# Patient Record
Sex: Female | Born: 1945
Health system: Southern US, Community
[De-identification: ages and names within clinical notes are randomized; demographics above are authoritative.]

## PROBLEM LIST (undated history)

## (undated) DIAGNOSIS — I209 Angina pectoris, unspecified: Secondary | ICD-10-CM

## (undated) DIAGNOSIS — IMO0002 Reserved for concepts with insufficient information to code with codable children: Secondary | ICD-10-CM

## (undated) DIAGNOSIS — M1990 Unspecified osteoarthritis, unspecified site: Secondary | ICD-10-CM

## (undated) DIAGNOSIS — G4733 Obstructive sleep apnea (adult) (pediatric): Secondary | ICD-10-CM

## (undated) DIAGNOSIS — G2111 Neuroleptic induced parkinsonism: Secondary | ICD-10-CM

## (undated) DIAGNOSIS — G4700 Insomnia, unspecified: Secondary | ICD-10-CM

## (undated) DIAGNOSIS — D509 Iron deficiency anemia, unspecified: Secondary | ICD-10-CM

## (undated) DIAGNOSIS — I2699 Other pulmonary embolism without acute cor pulmonale: Secondary | ICD-10-CM

## (undated) DIAGNOSIS — K922 Gastrointestinal hemorrhage, unspecified: Secondary | ICD-10-CM

## (undated) DIAGNOSIS — I10 Essential (primary) hypertension: Secondary | ICD-10-CM

## (undated) DIAGNOSIS — M545 Low back pain, unspecified: Secondary | ICD-10-CM

## (undated) DIAGNOSIS — N390 Urinary tract infection, site not specified: Secondary | ICD-10-CM

## (undated) DIAGNOSIS — I4510 Unspecified right bundle-branch block: Secondary | ICD-10-CM

## (undated) DIAGNOSIS — N200 Calculus of kidney: Secondary | ICD-10-CM

## (undated) DIAGNOSIS — Z8719 Personal history of other diseases of the digestive system: Secondary | ICD-10-CM

## (undated) DIAGNOSIS — N3946 Mixed incontinence: Secondary | ICD-10-CM

## (undated) DIAGNOSIS — M797 Fibromyalgia: Secondary | ICD-10-CM

## (undated) DIAGNOSIS — Z8701 Personal history of pneumonia (recurrent): Secondary | ICD-10-CM

## (undated) DIAGNOSIS — G43909 Migraine, unspecified, not intractable, without status migrainosus: Secondary | ICD-10-CM

## (undated) DIAGNOSIS — E673 Hypervitaminosis D: Secondary | ICD-10-CM

## (undated) DIAGNOSIS — D510 Vitamin B12 deficiency anemia due to intrinsic factor deficiency: Secondary | ICD-10-CM

## (undated) DIAGNOSIS — F419 Anxiety disorder, unspecified: Secondary | ICD-10-CM

## (undated) DIAGNOSIS — I5032 Chronic diastolic (congestive) heart failure: Secondary | ICD-10-CM

## (undated) DIAGNOSIS — K5790 Diverticulosis of intestine, part unspecified, without perforation or abscess without bleeding: Secondary | ICD-10-CM

## (undated) DIAGNOSIS — Z9989 Dependence on other enabling machines and devices: Secondary | ICD-10-CM

## (undated) DIAGNOSIS — J449 Chronic obstructive pulmonary disease, unspecified: Secondary | ICD-10-CM

## (undated) DIAGNOSIS — J920 Pleural plaque with presence of asbestos: Secondary | ICD-10-CM

## (undated) DIAGNOSIS — G44229 Chronic tension-type headache, not intractable: Secondary | ICD-10-CM

## (undated) DIAGNOSIS — L501 Idiopathic urticaria: Secondary | ICD-10-CM

## (undated) DIAGNOSIS — F339 Major depressive disorder, recurrent, unspecified: Secondary | ICD-10-CM

## (undated) DIAGNOSIS — J45909 Unspecified asthma, uncomplicated: Secondary | ICD-10-CM

## (undated) DIAGNOSIS — R55 Syncope and collapse: Secondary | ICD-10-CM

## (undated) DIAGNOSIS — I2510 Atherosclerotic heart disease of native coronary artery without angina pectoris: Secondary | ICD-10-CM

## (undated) DIAGNOSIS — H8110 Benign paroxysmal vertigo, unspecified ear: Secondary | ICD-10-CM

## (undated) DIAGNOSIS — R1312 Dysphagia, oropharyngeal phase: Secondary | ICD-10-CM

## (undated) DIAGNOSIS — J301 Allergic rhinitis due to pollen: Secondary | ICD-10-CM

## (undated) DIAGNOSIS — G8929 Other chronic pain: Secondary | ICD-10-CM

## (undated) DIAGNOSIS — N12 Tubulo-interstitial nephritis, not specified as acute or chronic: Secondary | ICD-10-CM

## (undated) DIAGNOSIS — M4850XA Collapsed vertebra, not elsewhere classified, site unspecified, initial encounter for fracture: Secondary | ICD-10-CM

## (undated) HISTORY — PX: DILATION AND CURETTAGE OF UTERUS: SHX78

## (undated) HISTORY — DX: Calculus of kidney: N20.0

## (undated) HISTORY — DX: Pleural plaque with presence of asbestos: J92.0

## (undated) HISTORY — DX: Insomnia, unspecified: G47.00

## (undated) HISTORY — PX: TRANSTHORACIC ECHOCARDIOGRAM: SHX275

## (undated) HISTORY — DX: Anxiety disorder, unspecified: F41.9

## (undated) HISTORY — PX: EYE SURGERY: SHX253

## (undated) HISTORY — DX: Fibromyalgia: M79.7

## (undated) HISTORY — DX: Other pulmonary embolism without acute cor pulmonale: I26.99

## (undated) HISTORY — DX: Chronic diastolic (congestive) heart failure: I50.32

## (undated) HISTORY — PX: CARDIOVASCULAR STRESS TEST: SHX262

## (undated) HISTORY — DX: Iron deficiency anemia, unspecified: D50.9

## (undated) HISTORY — DX: Allergic rhinitis due to pollen: J30.1

## (undated) HISTORY — DX: Migraine, unspecified, not intractable, without status migrainosus: G43.909

## (undated) HISTORY — DX: Dysphagia, oropharyngeal phase: R13.12

## (undated) HISTORY — DX: Syncope and collapse: R55

## (undated) HISTORY — DX: Unspecified asthma, uncomplicated: J45.909

## (undated) HISTORY — DX: Unspecified right bundle-branch block: I45.10

## (undated) HISTORY — DX: Vitamin B12 deficiency anemia due to intrinsic factor deficiency: D51.0

## (undated) HISTORY — DX: Atherosclerotic heart disease of native coronary artery without angina pectoris: I25.10

## (undated) HISTORY — DX: Gastrointestinal hemorrhage, unspecified: K92.2

## (undated) HISTORY — DX: Neuroleptic induced Parkinsonism: G21.11

## (undated) HISTORY — DX: Collapsed vertebra, not elsewhere classified, site unspecified, initial encounter for fracture: M48.50XA

## (undated) HISTORY — DX: Major depressive disorder, recurrent, unspecified: F33.9

## (undated) HISTORY — DX: Benign paroxysmal vertigo, unspecified ear: H81.10

## (undated) HISTORY — DX: Personal history of pneumonia (recurrent): Z87.01

## (undated) HISTORY — DX: Mixed incontinence: N39.46

## (undated) HISTORY — DX: Essential (primary) hypertension: I10

## (undated) HISTORY — PX: OTHER SURGICAL HISTORY: SHX169

## (undated) HISTORY — DX: Diverticulosis of intestine, part unspecified, without perforation or abscess without bleeding: K57.90

## (undated) HISTORY — PX: CT CTA CORONARY W/CA SCORE W/CM &/OR WO/CM: HXRAD787

## (undated) HISTORY — DX: Urinary tract infection, site not specified: N39.0

## (undated) HISTORY — DX: Unspecified osteoarthritis, unspecified site: M19.90

## (undated) HISTORY — DX: Idiopathic urticaria: L50.1

---
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
## 1970-12-23 HISTORY — PX: COCCYX REMOVAL: SHX600

## 1972-12-23 HISTORY — PX: TOTAL ABDOMINAL HYSTERECTOMY: SHX209

## 1976-12-23 HISTORY — PX: AXILLARY SURGERY: SHX892

## 1994-12-23 HISTORY — PX: TENDON RELEASE: SHX230

## 2003-12-24 HISTORY — PX: KNEE SURGERY: SHX244

## 2006-12-23 HISTORY — PX: PLANTAR FASCIA RELEASE: SHX2239

## 2006-12-23 HISTORY — PX: HEEL SPUR SURGERY: SHX665

## 2011-12-24 DIAGNOSIS — D500 Iron deficiency anemia secondary to blood loss (chronic): Secondary | ICD-10-CM | POA: Diagnosis not present

## 2011-12-24 DIAGNOSIS — D649 Anemia, unspecified: Secondary | ICD-10-CM | POA: Diagnosis not present

## 2011-12-24 DIAGNOSIS — R5383 Other fatigue: Secondary | ICD-10-CM | POA: Diagnosis not present

## 2011-12-24 DIAGNOSIS — R5381 Other malaise: Secondary | ICD-10-CM | POA: Diagnosis not present

## 2011-12-31 DIAGNOSIS — M62838 Other muscle spasm: Secondary | ICD-10-CM | POA: Diagnosis not present

## 2011-12-31 DIAGNOSIS — M533 Sacrococcygeal disorders, not elsewhere classified: Secondary | ICD-10-CM | POA: Diagnosis not present

## 2011-12-31 DIAGNOSIS — R51 Headache: Secondary | ICD-10-CM | POA: Diagnosis not present

## 2011-12-31 DIAGNOSIS — M47812 Spondylosis without myelopathy or radiculopathy, cervical region: Secondary | ICD-10-CM | POA: Diagnosis not present

## 2012-01-01 DIAGNOSIS — L820 Inflamed seborrheic keratosis: Secondary | ICD-10-CM | POA: Diagnosis not present

## 2012-01-01 DIAGNOSIS — L719 Rosacea, unspecified: Secondary | ICD-10-CM | POA: Diagnosis not present

## 2012-01-01 DIAGNOSIS — N390 Urinary tract infection, site not specified: Secondary | ICD-10-CM | POA: Diagnosis not present

## 2012-01-01 DIAGNOSIS — M542 Cervicalgia: Secondary | ICD-10-CM | POA: Diagnosis not present

## 2012-01-01 DIAGNOSIS — L578 Other skin changes due to chronic exposure to nonionizing radiation: Secondary | ICD-10-CM | POA: Diagnosis not present

## 2012-01-01 DIAGNOSIS — K529 Noninfective gastroenteritis and colitis, unspecified: Secondary | ICD-10-CM | POA: Diagnosis not present

## 2012-01-01 DIAGNOSIS — L251 Unspecified contact dermatitis due to drugs in contact with skin: Secondary | ICD-10-CM | POA: Diagnosis not present

## 2012-01-06 DIAGNOSIS — K529 Noninfective gastroenteritis and colitis, unspecified: Secondary | ICD-10-CM | POA: Diagnosis not present

## 2012-01-06 DIAGNOSIS — J111 Influenza due to unidentified influenza virus with other respiratory manifestations: Secondary | ICD-10-CM | POA: Diagnosis not present

## 2012-01-06 DIAGNOSIS — M545 Low back pain: Secondary | ICD-10-CM | POA: Diagnosis not present

## 2012-01-06 DIAGNOSIS — E559 Vitamin D deficiency, unspecified: Secondary | ICD-10-CM | POA: Diagnosis not present

## 2012-01-15 DIAGNOSIS — E559 Vitamin D deficiency, unspecified: Secondary | ICD-10-CM | POA: Diagnosis not present

## 2012-01-15 DIAGNOSIS — R51 Headache: Secondary | ICD-10-CM | POA: Diagnosis not present

## 2012-01-15 DIAGNOSIS — J111 Influenza due to unidentified influenza virus with other respiratory manifestations: Secondary | ICD-10-CM | POA: Diagnosis not present

## 2012-01-15 DIAGNOSIS — K529 Noninfective gastroenteritis and colitis, unspecified: Secondary | ICD-10-CM | POA: Diagnosis not present

## 2012-01-16 DIAGNOSIS — J189 Pneumonia, unspecified organism: Secondary | ICD-10-CM | POA: Diagnosis not present

## 2012-01-16 DIAGNOSIS — Z77090 Contact with and (suspected) exposure to asbestos: Secondary | ICD-10-CM | POA: Diagnosis not present

## 2012-01-16 DIAGNOSIS — R059 Cough, unspecified: Secondary | ICD-10-CM | POA: Diagnosis not present

## 2012-01-16 DIAGNOSIS — I10 Essential (primary) hypertension: Secondary | ICD-10-CM | POA: Diagnosis not present

## 2012-01-16 DIAGNOSIS — F341 Dysthymic disorder: Secondary | ICD-10-CM | POA: Diagnosis present

## 2012-01-16 DIAGNOSIS — E0590 Thyrotoxicosis, unspecified without thyrotoxic crisis or storm: Secondary | ICD-10-CM | POA: Diagnosis present

## 2012-01-16 DIAGNOSIS — R091 Pleurisy: Secondary | ICD-10-CM | POA: Diagnosis present

## 2012-01-16 DIAGNOSIS — R0789 Other chest pain: Secondary | ICD-10-CM | POA: Diagnosis not present

## 2012-01-16 DIAGNOSIS — R0602 Shortness of breath: Secondary | ICD-10-CM | POA: Diagnosis not present

## 2012-01-16 DIAGNOSIS — J45909 Unspecified asthma, uncomplicated: Secondary | ICD-10-CM | POA: Diagnosis present

## 2012-01-16 DIAGNOSIS — J984 Other disorders of lung: Secondary | ICD-10-CM | POA: Diagnosis not present

## 2012-01-16 DIAGNOSIS — G4733 Obstructive sleep apnea (adult) (pediatric): Secondary | ICD-10-CM | POA: Diagnosis present

## 2012-01-16 DIAGNOSIS — J180 Bronchopneumonia, unspecified organism: Secondary | ICD-10-CM | POA: Diagnosis present

## 2012-01-16 DIAGNOSIS — Z79899 Other long term (current) drug therapy: Secondary | ICD-10-CM | POA: Diagnosis not present

## 2012-01-16 DIAGNOSIS — R0902 Hypoxemia: Secondary | ICD-10-CM | POA: Diagnosis not present

## 2012-01-16 DIAGNOSIS — I129 Hypertensive chronic kidney disease with stage 1 through stage 4 chronic kidney disease, or unspecified chronic kidney disease: Secondary | ICD-10-CM | POA: Diagnosis present

## 2012-01-16 DIAGNOSIS — E871 Hypo-osmolality and hyponatremia: Secondary | ICD-10-CM | POA: Diagnosis present

## 2012-01-16 DIAGNOSIS — K559 Vascular disorder of intestine, unspecified: Secondary | ICD-10-CM | POA: Diagnosis present

## 2012-01-16 DIAGNOSIS — J449 Chronic obstructive pulmonary disease, unspecified: Secondary | ICD-10-CM | POA: Diagnosis not present

## 2012-01-16 DIAGNOSIS — R05 Cough: Secondary | ICD-10-CM | POA: Diagnosis not present

## 2012-01-23 DIAGNOSIS — F411 Generalized anxiety disorder: Secondary | ICD-10-CM | POA: Diagnosis not present

## 2012-01-23 DIAGNOSIS — K529 Noninfective gastroenteritis and colitis, unspecified: Secondary | ICD-10-CM | POA: Diagnosis not present

## 2012-01-23 DIAGNOSIS — M545 Low back pain: Secondary | ICD-10-CM | POA: Diagnosis not present

## 2012-01-23 DIAGNOSIS — D509 Iron deficiency anemia, unspecified: Secondary | ICD-10-CM | POA: Diagnosis not present

## 2012-01-28 DIAGNOSIS — M533 Sacrococcygeal disorders, not elsewhere classified: Secondary | ICD-10-CM | POA: Diagnosis not present

## 2012-01-28 DIAGNOSIS — IMO0002 Reserved for concepts with insufficient information to code with codable children: Secondary | ICD-10-CM | POA: Diagnosis not present

## 2012-01-28 DIAGNOSIS — M47812 Spondylosis without myelopathy or radiculopathy, cervical region: Secondary | ICD-10-CM | POA: Diagnosis not present

## 2012-01-28 DIAGNOSIS — G894 Chronic pain syndrome: Secondary | ICD-10-CM | POA: Diagnosis not present

## 2012-01-30 DIAGNOSIS — L259 Unspecified contact dermatitis, unspecified cause: Secondary | ICD-10-CM | POA: Diagnosis not present

## 2012-02-04 DIAGNOSIS — G4733 Obstructive sleep apnea (adult) (pediatric): Secondary | ICD-10-CM | POA: Diagnosis not present

## 2012-02-04 DIAGNOSIS — G4700 Insomnia, unspecified: Secondary | ICD-10-CM | POA: Diagnosis not present

## 2012-02-04 DIAGNOSIS — R0902 Hypoxemia: Secondary | ICD-10-CM | POA: Diagnosis not present

## 2012-02-04 DIAGNOSIS — F329 Major depressive disorder, single episode, unspecified: Secondary | ICD-10-CM | POA: Diagnosis not present

## 2012-02-05 DIAGNOSIS — G4733 Obstructive sleep apnea (adult) (pediatric): Secondary | ICD-10-CM | POA: Diagnosis not present

## 2012-02-05 DIAGNOSIS — Z77090 Contact with and (suspected) exposure to asbestos: Secondary | ICD-10-CM | POA: Diagnosis not present

## 2012-02-05 DIAGNOSIS — J811 Chronic pulmonary edema: Secondary | ICD-10-CM | POA: Diagnosis not present

## 2012-02-05 DIAGNOSIS — J189 Pneumonia, unspecified organism: Secondary | ICD-10-CM | POA: Diagnosis not present

## 2012-02-05 DIAGNOSIS — R0902 Hypoxemia: Secondary | ICD-10-CM | POA: Diagnosis not present

## 2012-02-05 DIAGNOSIS — R091 Pleurisy: Secondary | ICD-10-CM | POA: Diagnosis not present

## 2012-02-05 DIAGNOSIS — R0989 Other specified symptoms and signs involving the circulatory and respiratory systems: Secondary | ICD-10-CM | POA: Diagnosis not present

## 2012-02-17 DIAGNOSIS — G894 Chronic pain syndrome: Secondary | ICD-10-CM | POA: Diagnosis not present

## 2012-02-17 DIAGNOSIS — M542 Cervicalgia: Secondary | ICD-10-CM | POA: Diagnosis not present

## 2012-02-17 DIAGNOSIS — M545 Low back pain: Secondary | ICD-10-CM | POA: Diagnosis not present

## 2012-02-17 DIAGNOSIS — M79609 Pain in unspecified limb: Secondary | ICD-10-CM | POA: Diagnosis not present

## 2012-02-20 DIAGNOSIS — E785 Hyperlipidemia, unspecified: Secondary | ICD-10-CM | POA: Diagnosis not present

## 2012-02-20 DIAGNOSIS — I10 Essential (primary) hypertension: Secondary | ICD-10-CM | POA: Diagnosis not present

## 2012-02-20 DIAGNOSIS — D509 Iron deficiency anemia, unspecified: Secondary | ICD-10-CM | POA: Diagnosis not present

## 2012-02-20 DIAGNOSIS — R5381 Other malaise: Secondary | ICD-10-CM | POA: Diagnosis not present

## 2012-02-26 DIAGNOSIS — D649 Anemia, unspecified: Secondary | ICD-10-CM | POA: Diagnosis not present

## 2012-02-26 DIAGNOSIS — R5383 Other fatigue: Secondary | ICD-10-CM | POA: Diagnosis not present

## 2012-02-26 DIAGNOSIS — D500 Iron deficiency anemia secondary to blood loss (chronic): Secondary | ICD-10-CM | POA: Diagnosis not present

## 2012-02-26 DIAGNOSIS — M1710 Unilateral primary osteoarthritis, unspecified knee: Secondary | ICD-10-CM | POA: Diagnosis not present

## 2012-02-26 DIAGNOSIS — M5137 Other intervertebral disc degeneration, lumbosacral region: Secondary | ICD-10-CM | POA: Diagnosis not present

## 2012-02-26 DIAGNOSIS — E559 Vitamin D deficiency, unspecified: Secondary | ICD-10-CM | POA: Diagnosis not present

## 2012-02-26 DIAGNOSIS — M5030 Other cervical disc degeneration, unspecified cervical region: Secondary | ICD-10-CM | POA: Diagnosis not present

## 2012-02-26 DIAGNOSIS — M810 Age-related osteoporosis without current pathological fracture: Secondary | ICD-10-CM | POA: Diagnosis not present

## 2012-02-27 DIAGNOSIS — M533 Sacrococcygeal disorders, not elsewhere classified: Secondary | ICD-10-CM | POA: Diagnosis not present

## 2012-02-27 DIAGNOSIS — M542 Cervicalgia: Secondary | ICD-10-CM | POA: Diagnosis not present

## 2012-02-27 DIAGNOSIS — G894 Chronic pain syndrome: Secondary | ICD-10-CM | POA: Diagnosis not present

## 2012-02-27 DIAGNOSIS — IMO0002 Reserved for concepts with insufficient information to code with codable children: Secondary | ICD-10-CM | POA: Diagnosis not present

## 2012-03-05 DIAGNOSIS — H3090 Unspecified chorioretinal inflammation, unspecified eye: Secondary | ICD-10-CM | POA: Diagnosis not present

## 2012-03-05 DIAGNOSIS — H35359 Cystoid macular degeneration, unspecified eye: Secondary | ICD-10-CM | POA: Diagnosis not present

## 2012-03-05 DIAGNOSIS — Z961 Presence of intraocular lens: Secondary | ICD-10-CM | POA: Diagnosis not present

## 2012-03-12 DIAGNOSIS — F411 Generalized anxiety disorder: Secondary | ICD-10-CM | POA: Diagnosis not present

## 2012-03-12 DIAGNOSIS — E559 Vitamin D deficiency, unspecified: Secondary | ICD-10-CM | POA: Diagnosis not present

## 2012-03-12 DIAGNOSIS — K529 Noninfective gastroenteritis and colitis, unspecified: Secondary | ICD-10-CM | POA: Diagnosis not present

## 2012-03-12 DIAGNOSIS — G4700 Insomnia, unspecified: Secondary | ICD-10-CM | POA: Diagnosis not present

## 2012-04-03 DIAGNOSIS — H3581 Retinal edema: Secondary | ICD-10-CM | POA: Diagnosis not present

## 2012-04-03 DIAGNOSIS — Z961 Presence of intraocular lens: Secondary | ICD-10-CM | POA: Diagnosis not present

## 2012-04-09 DIAGNOSIS — K529 Noninfective gastroenteritis and colitis, unspecified: Secondary | ICD-10-CM | POA: Diagnosis not present

## 2012-04-09 DIAGNOSIS — F411 Generalized anxiety disorder: Secondary | ICD-10-CM | POA: Diagnosis not present

## 2012-04-09 DIAGNOSIS — E559 Vitamin D deficiency, unspecified: Secondary | ICD-10-CM | POA: Diagnosis not present

## 2012-04-09 DIAGNOSIS — G4700 Insomnia, unspecified: Secondary | ICD-10-CM | POA: Diagnosis not present

## 2012-04-27 DIAGNOSIS — R5383 Other fatigue: Secondary | ICD-10-CM | POA: Diagnosis not present

## 2012-04-27 DIAGNOSIS — D649 Anemia, unspecified: Secondary | ICD-10-CM | POA: Diagnosis not present

## 2012-04-27 DIAGNOSIS — D500 Iron deficiency anemia secondary to blood loss (chronic): Secondary | ICD-10-CM | POA: Diagnosis not present

## 2012-04-27 DIAGNOSIS — R5381 Other malaise: Secondary | ICD-10-CM | POA: Diagnosis not present

## 2012-05-06 DIAGNOSIS — M542 Cervicalgia: Secondary | ICD-10-CM | POA: Diagnosis not present

## 2012-05-06 DIAGNOSIS — R5383 Other fatigue: Secondary | ICD-10-CM | POA: Diagnosis not present

## 2012-05-06 DIAGNOSIS — R5381 Other malaise: Secondary | ICD-10-CM | POA: Diagnosis not present

## 2012-05-06 DIAGNOSIS — K529 Noninfective gastroenteritis and colitis, unspecified: Secondary | ICD-10-CM | POA: Diagnosis not present

## 2012-05-06 DIAGNOSIS — F411 Generalized anxiety disorder: Secondary | ICD-10-CM | POA: Diagnosis not present

## 2012-05-27 DIAGNOSIS — E559 Vitamin D deficiency, unspecified: Secondary | ICD-10-CM | POA: Diagnosis not present

## 2012-05-27 DIAGNOSIS — M5137 Other intervertebral disc degeneration, lumbosacral region: Secondary | ICD-10-CM | POA: Diagnosis not present

## 2012-05-27 DIAGNOSIS — M810 Age-related osteoporosis without current pathological fracture: Secondary | ICD-10-CM | POA: Diagnosis not present

## 2012-05-27 DIAGNOSIS — M1710 Unilateral primary osteoarthritis, unspecified knee: Secondary | ICD-10-CM | POA: Diagnosis not present

## 2012-06-04 DIAGNOSIS — M545 Low back pain: Secondary | ICD-10-CM | POA: Diagnosis not present

## 2012-06-04 DIAGNOSIS — M542 Cervicalgia: Secondary | ICD-10-CM | POA: Diagnosis not present

## 2012-06-04 DIAGNOSIS — K529 Noninfective gastroenteritis and colitis, unspecified: Secondary | ICD-10-CM | POA: Diagnosis not present

## 2012-06-04 DIAGNOSIS — G4700 Insomnia, unspecified: Secondary | ICD-10-CM | POA: Diagnosis not present

## 2012-06-24 DIAGNOSIS — D235 Other benign neoplasm of skin of trunk: Secondary | ICD-10-CM | POA: Diagnosis not present

## 2012-06-24 DIAGNOSIS — L578 Other skin changes due to chronic exposure to nonionizing radiation: Secondary | ICD-10-CM | POA: Diagnosis not present

## 2012-06-24 DIAGNOSIS — L259 Unspecified contact dermatitis, unspecified cause: Secondary | ICD-10-CM | POA: Diagnosis not present

## 2012-06-24 DIAGNOSIS — D1801 Hemangioma of skin and subcutaneous tissue: Secondary | ICD-10-CM | POA: Diagnosis not present

## 2012-07-02 DIAGNOSIS — R51 Headache: Secondary | ICD-10-CM | POA: Diagnosis not present

## 2012-07-02 DIAGNOSIS — D500 Iron deficiency anemia secondary to blood loss (chronic): Secondary | ICD-10-CM | POA: Diagnosis not present

## 2012-07-02 DIAGNOSIS — R5381 Other malaise: Secondary | ICD-10-CM | POA: Diagnosis not present

## 2012-07-02 DIAGNOSIS — D649 Anemia, unspecified: Secondary | ICD-10-CM | POA: Diagnosis not present

## 2012-07-02 DIAGNOSIS — F411 Generalized anxiety disorder: Secondary | ICD-10-CM | POA: Diagnosis not present

## 2012-07-02 DIAGNOSIS — G4700 Insomnia, unspecified: Secondary | ICD-10-CM | POA: Diagnosis not present

## 2012-07-02 DIAGNOSIS — M542 Cervicalgia: Secondary | ICD-10-CM | POA: Diagnosis not present

## 2012-07-06 DIAGNOSIS — M542 Cervicalgia: Secondary | ICD-10-CM | POA: Diagnosis not present

## 2012-07-07 DIAGNOSIS — H3090 Unspecified chorioretinal inflammation, unspecified eye: Secondary | ICD-10-CM | POA: Diagnosis not present

## 2012-07-07 DIAGNOSIS — H35359 Cystoid macular degeneration, unspecified eye: Secondary | ICD-10-CM | POA: Diagnosis not present

## 2012-07-07 DIAGNOSIS — Z961 Presence of intraocular lens: Secondary | ICD-10-CM | POA: Diagnosis not present

## 2012-07-30 DIAGNOSIS — G4700 Insomnia, unspecified: Secondary | ICD-10-CM | POA: Diagnosis not present

## 2012-07-30 DIAGNOSIS — R51 Headache: Secondary | ICD-10-CM | POA: Diagnosis not present

## 2012-07-30 DIAGNOSIS — F411 Generalized anxiety disorder: Secondary | ICD-10-CM | POA: Diagnosis not present

## 2012-07-30 DIAGNOSIS — M542 Cervicalgia: Secondary | ICD-10-CM | POA: Diagnosis not present

## 2012-08-05 DIAGNOSIS — G4700 Insomnia, unspecified: Secondary | ICD-10-CM | POA: Diagnosis not present

## 2012-08-05 DIAGNOSIS — G4733 Obstructive sleep apnea (adult) (pediatric): Secondary | ICD-10-CM | POA: Diagnosis not present

## 2012-08-05 DIAGNOSIS — R0902 Hypoxemia: Secondary | ICD-10-CM | POA: Diagnosis not present

## 2012-08-05 DIAGNOSIS — R0602 Shortness of breath: Secondary | ICD-10-CM | POA: Diagnosis not present

## 2012-08-11 DIAGNOSIS — M5412 Radiculopathy, cervical region: Secondary | ICD-10-CM | POA: Diagnosis not present

## 2012-08-11 DIAGNOSIS — M47812 Spondylosis without myelopathy or radiculopathy, cervical region: Secondary | ICD-10-CM | POA: Diagnosis not present

## 2012-08-14 DIAGNOSIS — Z961 Presence of intraocular lens: Secondary | ICD-10-CM | POA: Diagnosis not present

## 2012-08-14 DIAGNOSIS — H18459 Nodular corneal degeneration, unspecified eye: Secondary | ICD-10-CM | POA: Diagnosis not present

## 2012-08-14 DIAGNOSIS — H3581 Retinal edema: Secondary | ICD-10-CM | POA: Diagnosis not present

## 2012-08-20 DIAGNOSIS — R0902 Hypoxemia: Secondary | ICD-10-CM | POA: Diagnosis not present

## 2012-08-20 DIAGNOSIS — R091 Pleurisy: Secondary | ICD-10-CM | POA: Diagnosis not present

## 2012-08-20 DIAGNOSIS — Z77090 Contact with and (suspected) exposure to asbestos: Secondary | ICD-10-CM | POA: Diagnosis not present

## 2012-08-21 DIAGNOSIS — R0789 Other chest pain: Secondary | ICD-10-CM | POA: Diagnosis not present

## 2012-08-21 DIAGNOSIS — J45909 Unspecified asthma, uncomplicated: Secondary | ICD-10-CM | POA: Diagnosis not present

## 2012-08-21 DIAGNOSIS — R942 Abnormal results of pulmonary function studies: Secondary | ICD-10-CM | POA: Diagnosis not present

## 2012-08-21 DIAGNOSIS — R0989 Other specified symptoms and signs involving the circulatory and respiratory systems: Secondary | ICD-10-CM | POA: Diagnosis not present

## 2012-08-21 DIAGNOSIS — Z77090 Contact with and (suspected) exposure to asbestos: Secondary | ICD-10-CM | POA: Diagnosis not present

## 2012-08-21 DIAGNOSIS — R0609 Other forms of dyspnea: Secondary | ICD-10-CM | POA: Diagnosis not present

## 2012-08-25 DIAGNOSIS — D500 Iron deficiency anemia secondary to blood loss (chronic): Secondary | ICD-10-CM | POA: Diagnosis not present

## 2012-08-25 DIAGNOSIS — R5381 Other malaise: Secondary | ICD-10-CM | POA: Diagnosis not present

## 2012-08-25 DIAGNOSIS — N390 Urinary tract infection, site not specified: Secondary | ICD-10-CM | POA: Diagnosis not present

## 2012-08-25 DIAGNOSIS — G4700 Insomnia, unspecified: Secondary | ICD-10-CM | POA: Diagnosis not present

## 2012-08-25 DIAGNOSIS — Z23 Encounter for immunization: Secondary | ICD-10-CM | POA: Diagnosis not present

## 2012-08-25 DIAGNOSIS — D649 Anemia, unspecified: Secondary | ICD-10-CM | POA: Diagnosis not present

## 2012-08-25 DIAGNOSIS — R5383 Other fatigue: Secondary | ICD-10-CM | POA: Diagnosis not present

## 2012-08-25 DIAGNOSIS — F411 Generalized anxiety disorder: Secondary | ICD-10-CM | POA: Diagnosis not present

## 2012-08-26 DIAGNOSIS — G4733 Obstructive sleep apnea (adult) (pediatric): Secondary | ICD-10-CM | POA: Diagnosis not present

## 2012-08-26 DIAGNOSIS — Z77090 Contact with and (suspected) exposure to asbestos: Secondary | ICD-10-CM | POA: Diagnosis not present

## 2012-08-26 DIAGNOSIS — R0609 Other forms of dyspnea: Secondary | ICD-10-CM | POA: Diagnosis not present

## 2012-08-26 DIAGNOSIS — I129 Hypertensive chronic kidney disease with stage 1 through stage 4 chronic kidney disease, or unspecified chronic kidney disease: Secondary | ICD-10-CM | POA: Diagnosis not present

## 2012-08-26 DIAGNOSIS — J45909 Unspecified asthma, uncomplicated: Secondary | ICD-10-CM | POA: Diagnosis not present

## 2012-08-26 DIAGNOSIS — R091 Pleurisy: Secondary | ICD-10-CM | POA: Diagnosis not present

## 2012-08-26 DIAGNOSIS — I4949 Other premature depolarization: Secondary | ICD-10-CM | POA: Diagnosis not present

## 2012-08-26 DIAGNOSIS — R0989 Other specified symptoms and signs involving the circulatory and respiratory systems: Secondary | ICD-10-CM | POA: Diagnosis not present

## 2012-08-26 DIAGNOSIS — N189 Chronic kidney disease, unspecified: Secondary | ICD-10-CM | POA: Diagnosis not present

## 2012-08-31 DIAGNOSIS — R0989 Other specified symptoms and signs involving the circulatory and respiratory systems: Secondary | ICD-10-CM | POA: Diagnosis not present

## 2012-08-31 DIAGNOSIS — M4802 Spinal stenosis, cervical region: Secondary | ICD-10-CM | POA: Diagnosis not present

## 2012-08-31 DIAGNOSIS — R079 Chest pain, unspecified: Secondary | ICD-10-CM | POA: Diagnosis not present

## 2012-08-31 DIAGNOSIS — R942 Abnormal results of pulmonary function studies: Secondary | ICD-10-CM | POA: Diagnosis not present

## 2012-09-04 DIAGNOSIS — R51 Headache: Secondary | ICD-10-CM | POA: Diagnosis not present

## 2012-09-04 DIAGNOSIS — M542 Cervicalgia: Secondary | ICD-10-CM | POA: Diagnosis not present

## 2012-09-04 DIAGNOSIS — G4700 Insomnia, unspecified: Secondary | ICD-10-CM | POA: Diagnosis not present

## 2012-09-04 DIAGNOSIS — F411 Generalized anxiety disorder: Secondary | ICD-10-CM | POA: Diagnosis not present

## 2012-09-07 DIAGNOSIS — G4733 Obstructive sleep apnea (adult) (pediatric): Secondary | ICD-10-CM | POA: Diagnosis not present

## 2012-09-07 DIAGNOSIS — M5412 Radiculopathy, cervical region: Secondary | ICD-10-CM | POA: Diagnosis not present

## 2012-09-07 DIAGNOSIS — G4700 Insomnia, unspecified: Secondary | ICD-10-CM | POA: Diagnosis not present

## 2012-09-07 DIAGNOSIS — R079 Chest pain, unspecified: Secondary | ICD-10-CM | POA: Diagnosis not present

## 2012-09-07 DIAGNOSIS — M542 Cervicalgia: Secondary | ICD-10-CM | POA: Diagnosis not present

## 2012-09-09 DIAGNOSIS — J45909 Unspecified asthma, uncomplicated: Secondary | ICD-10-CM | POA: Diagnosis not present

## 2012-09-09 DIAGNOSIS — Z79899 Other long term (current) drug therapy: Secondary | ICD-10-CM | POA: Diagnosis not present

## 2012-09-09 DIAGNOSIS — G4730 Sleep apnea, unspecified: Secondary | ICD-10-CM | POA: Diagnosis not present

## 2012-09-09 DIAGNOSIS — Z7982 Long term (current) use of aspirin: Secondary | ICD-10-CM | POA: Diagnosis not present

## 2012-09-09 DIAGNOSIS — I10 Essential (primary) hypertension: Secondary | ICD-10-CM | POA: Diagnosis not present

## 2012-09-09 DIAGNOSIS — M4802 Spinal stenosis, cervical region: Secondary | ICD-10-CM | POA: Diagnosis not present

## 2012-09-14 DIAGNOSIS — R079 Chest pain, unspecified: Secondary | ICD-10-CM | POA: Diagnosis not present

## 2012-09-23 DIAGNOSIS — Z77090 Contact with and (suspected) exposure to asbestos: Secondary | ICD-10-CM | POA: Diagnosis not present

## 2012-09-23 DIAGNOSIS — Z79899 Other long term (current) drug therapy: Secondary | ICD-10-CM | POA: Diagnosis not present

## 2012-09-23 DIAGNOSIS — G4730 Sleep apnea, unspecified: Secondary | ICD-10-CM | POA: Diagnosis not present

## 2012-09-23 DIAGNOSIS — Z880 Allergy status to penicillin: Secondary | ICD-10-CM | POA: Diagnosis not present

## 2012-09-23 DIAGNOSIS — I10 Essential (primary) hypertension: Secondary | ICD-10-CM | POA: Diagnosis not present

## 2012-09-23 DIAGNOSIS — M4802 Spinal stenosis, cervical region: Secondary | ICD-10-CM | POA: Diagnosis not present

## 2012-09-23 DIAGNOSIS — J45909 Unspecified asthma, uncomplicated: Secondary | ICD-10-CM | POA: Diagnosis not present

## 2012-10-16 ENCOUNTER — Encounter: Payer: Self-pay | Admitting: Family Medicine

## 2012-10-16 ENCOUNTER — Ambulatory Visit (INDEPENDENT_AMBULATORY_CARE_PROVIDER_SITE_OTHER): Payer: Medicare Other | Admitting: Family Medicine

## 2012-10-16 VITALS — BP 160/100 | HR 76 | Temp 97.6°F | Ht 64.0 in | Wt <= 1120 oz

## 2012-10-16 DIAGNOSIS — M76829 Posterior tibial tendinitis, unspecified leg: Secondary | ICD-10-CM | POA: Diagnosis not present

## 2012-10-16 DIAGNOSIS — M79609 Pain in unspecified limb: Secondary | ICD-10-CM | POA: Diagnosis not present

## 2012-10-16 DIAGNOSIS — D509 Iron deficiency anemia, unspecified: Secondary | ICD-10-CM | POA: Diagnosis not present

## 2012-10-16 DIAGNOSIS — R319 Hematuria, unspecified: Secondary | ICD-10-CM | POA: Diagnosis not present

## 2012-10-16 DIAGNOSIS — M79669 Pain in unspecified lower leg: Secondary | ICD-10-CM

## 2012-10-16 DIAGNOSIS — N390 Urinary tract infection, site not specified: Secondary | ICD-10-CM | POA: Insufficient documentation

## 2012-10-16 LAB — CBC WITH DIFFERENTIAL/PLATELET
Basophils Relative: 0.9 % (ref 0.0–3.0)
Eosinophils Absolute: 0.1 10*3/uL (ref 0.0–0.7)
Hemoglobin: 12 g/dL (ref 12.0–15.0)
Lymphocytes Relative: 42.2 % (ref 12.0–46.0)
MCHC: 33.2 g/dL (ref 30.0–36.0)
MCV: 92.5 fl (ref 78.0–100.0)
Monocytes Absolute: 0.3 10*3/uL (ref 0.1–1.0)
Neutro Abs: 1.8 10*3/uL (ref 1.4–7.7)
RBC: 3.9 Mil/uL (ref 3.87–5.11)

## 2012-10-16 LAB — URIC ACID: Uric Acid, Serum: 3.1 mg/dL (ref 2.4–7.0)

## 2012-10-16 LAB — COMPREHENSIVE METABOLIC PANEL
AST: 25 U/L (ref 0–37)
BUN: 15 mg/dL (ref 6–23)
Calcium: 8.6 mg/dL (ref 8.4–10.5)
Chloride: 104 mEq/L (ref 96–112)
Creatinine, Ser: 0.6 mg/dL (ref 0.4–1.2)
GFR: 102.18 mL/min (ref >60.00)

## 2012-10-16 LAB — D-DIMER, QUANTITATIVE: D-Dimer, Quant: 0.27 ug/mL-FEU (ref 0.00–0.48)

## 2012-10-16 MED ORDER — CIPROFLOXACIN HCL 500 MG PO TABS: 500.0000 mg | ORAL_TABLET | Freq: Two times a day (BID) | ORAL | Status: DC

## 2012-10-16 MED ORDER — PREDNISONE 20 MG PO TABS: ORAL_TABLET | ORAL | Status: DC

## 2012-10-16 NOTE — Progress Notes (Signed)
Office Note 10/17/2012  CC:  Chief Complaint  Patient presents with  . Establish Care    pain in right calf, ankle, swelling also; now having pain in left ankle    HPI:  Linda [name] is a 66 y.o. White female who is here to establish care. Patient's most recent primary MD: Dr. Ronnette Hila in [place], [place].  Has a hematologist and rheumatologist in the same town. Old records were not reviewed prior to or during today's visit.  Patient is new to the area, says she is "doctor shopping".   Primary problems she wants addressed today are her urinary sx's and her right ankle/calf pain.  #1) Urine: mild dysuria/discomfort upon urination, mild right flank pain--both sx's onset today.  + hx of recurrent lower and upper UTIs.  Denies fever, nausea, abd pain, hematuria, urgency, or frequency. #2) ankles: About 3-4 days ago she noted onset of pain and swelling in right ankle, says it was a bit swollen in medial ankle, a bit red as well in same area.  Ambulation/wt bearing makes the pain worse, elevation of the leg makes it better.  Seems to have pain/tenderness extending up her posterolateral right calf as well--to the base of her knee.  On left ankle, she has noted no pain, but notes 1 day hx of mild swelling in medial ankle region.  Past Medical History  Diagnosis Date  . HTN (hypertension)   . Depression   . Anxiety     with panic attacks  . DDD (degenerative disc disease), lumbar   . Osteoarthritis   . DDD (degenerative disc disease), cervical   . OSA (obstructive sleep apnea)   . Asthma     + asbestososis (father worked in shipyard with asbestos.  . Recurrent UTI   . Migraine syndrome   . Nephrolithiasis   . Hay fever   . Iron deficiency anemia     extensive w/u; no cause found; failed oral supplement;; gets fairly regular (q10m or so) IV iron infusions  . Mixed incontinence urge and stress   . Diverticular disease   . Insomnia   . Fibromyalgia     Patient states dx was  around her late 75s but she had sx's for years prior to this.  No hx of DVT or PE.  Past Surgical History  Procedure Date  . Appendectomy 1960  . Total abdominal hysterectomy 1974  . Tendon release 1996    Right forearm and hand  . Knee surgery 2005  . Heel spur surgery 2008    left foot  . Plantar fascia release 2008    left  . Axillary surgery 1978    Multiple "lump" in armpit per pt  . Coccyx removal 1972    Family History  Problem Relation Age of Onset  . Arthritis Mother   . Kidney disease Mother   . Heart disease Father   . Stroke Father   . Hypertension Father   . Diabetes Father     History   Social History  . Marital Status: Married    Spouse Name: N/A    Number of Children: N/A  . Years of Education: N/A   Occupational History  . Not on file.   Social History Main Topics  . Smoking status: Never Smoker   . Smokeless tobacco: Never Used  . Alcohol Use: No  . Drug Use: No  . Sexually Active: Not on file   Other Topics Concern  . Not on file  Social History Narrative   Married, 2 sons.  Relocated to [place] 09/2012 to be closer to her son who has MS.Occupation: Runner, broadcasting/film/video.Education: masters degree level.No T/A/Ds.    Outpatient Encounter Prescriptions as of 10/16/2012  Medication Sig Dispense Refill  . ALPRAZolam (XANAX) 1 MG tablet Take 1 mg by mouth 4 (four) times daily.      [name] Kitchen amitriptyline (ELAVIL) 100 MG tablet Take 100 mg by mouth at bedtime.      [name] Kitchen aspirin EC 81 MG tablet Take 81 mg by mouth daily.      . budesonide-formoterol (SYMBICORT) 160-4.5 MCG/ACT inhaler Inhale 2 puffs into the lungs 2 (two) times daily.      . butalbital-acetaminophen-caffeine (FIORICET, ESGIC) 50-325-40 MG per tablet Take 1 tablet by mouth every 6 (six) hours as needed.      . ciprofloxacin (CIPRO) 500 MG tablet Take 1 tablet (500 mg total) by mouth 2 (two) times daily.  10 tablet  0  . cyclobenzaprine (FLEXERIL) 10 MG tablet Take 10 mg by mouth 4 (four) times daily as  needed.      . desvenlafaxine (PRISTIQ) 100 MG 24 hr tablet Take 100 mg by mouth daily.      [name] Kitchen estrogens, conjugated, (PREMARIN) 0.9 MG tablet Take 0.9 mg by mouth daily.      [name] Kitchen HYDROcodone-acetaminophen (LORTAB) 10-500 MG per tablet Take 1 tablet by mouth every 6 (six) hours as needed.      [name] Kitchen lisinopril (PRINIVIL,ZESTRIL) 10 MG tablet Take 10 mg by mouth 3 (three) times daily.      . meloxicam (MOBIC) 7.5 MG tablet Take 7.5 mg by mouth 2 (two) times daily.      . metoprolol succinate (TOPROL-XL) 100 MG 24 hr tablet Take 100 mg by mouth daily. Take with or immediately following a meal.      . predniSONE (DELTASONE) 20 MG tablet 2 tabs po qd x 3d, then 1 tab po qd x 3d, then 1/2 tab po qd x 2d, then stop  10 tablet  0  . pregabalin (LYRICA) 300 MG capsule Take 300 mg by mouth 2 (two) times daily.      [name] Kitchen zolpidem (AMBIEN) 5 MG tablet Take 5 mg by mouth at bedtime as needed.      . Zolpidem Tartrate (INTERMEZZO) 3.5 MG SUBL Place 1 tablet under the tongue as needed.        Allergies  Allergen Reactions  . Penicillins Itching, Swelling and Rash    ROS Review of Systems  Constitutional: Negative for fever, chills, appetite change and fatigue.  HENT: Negative for ear pain, congestion, sore throat, neck stiffness and dental problem.   Eyes: Negative for discharge, redness and visual disturbance.  Respiratory: Negative for cough, chest tightness, shortness of breath and wheezing.   Cardiovascular: Negative for chest pain, palpitations and leg swelling.  Gastrointestinal: Negative for nausea, vomiting, abdominal pain, diarrhea and blood in stool.  Genitourinary: Positive for dysuria and flank pain (mild, see HPI). Negative for urgency, frequency, hematuria, vaginal bleeding and difficulty urinating.  Musculoskeletal: Positive for joint swelling (ankles), arthralgias (right ankle) and gait problem (due to ankle pain). Negative for myalgias and back pain.  Skin: Negative for pallor and rash.    Neurological: Negative for dizziness, speech difficulty, weakness and headaches.  Hematological: Negative for adenopathy. Does not bruise/bleed easily.  Psychiatric/Behavioral: Negative for confusion and disturbed wake/sleep cycle. The patient is not nervous/anxious.     PE; Blood pressure 160/100, pulse 76, temperature 97.6 F (36.4  C), temperature source Temporal, height 5\' 4"  (1.626 m), weight 56 lb (25.401 kg), SpO2 94.00%. Gen: Alert, well appearing.  Patient is oriented to person, place, time, and situation. AFFECT: pleasant, lucid thought and speech. ENT:  Eyes: no injection, icteris, swelling, or exudate.  EOMI, PERRLA. Nose: no drainage or turbinate edema/swelling.  No injection or focal lesion.  Mouth: lips without lesion/swelling.  Oral mucosa pink and moist.  Oropharynx without erythema, exudate, or swelling.  Neck - No masses or thyromegaly or limitation in range of motion CV: RRR, no m/r/g.   LUNGS: CTA bilat, nonlabored resps, good aeration in all lung fields. ABD: soft, ND, NT EXT: no cyanosis.   Right ankle with mild medial soft tissue swelling and warmth--focally just inferior to the medial malleolus.  No bony tenderness.  TTP starting at the area of warmth and swelling in medial ankle, extending up calf (milder intensity) to the posterolateral aspect of the base of her right knee, and extending inferiorly over the medial aspect of the dorsal surface of her right foot.  Right knee without significant swelling, erythema, or tenderness.  Left ankle with mild STS in medial ankle below the medial malleolus, but no tenderness.  No left calf tenderness. She has bilateral 1+ pitting edema in the ankles and lower calves. Remainder of ankle without tenderness or warmth on right.  +Pain with resisted foot dorsiflexion on the right.  Pertinent labs:  CC UA today showed trace intact blood, otherwise was normal.  ASSESSMENT AND PLAN:   New pt: obtain old records.  UTI (lower urinary  tract infection) Mildly symptomatic, mildly abnormal urine, + hx of serious recurrent lower and upper UTI's per pt's report. Started cipro 500mg  bid x 5d today. Sent urine for c/s.  Calf pain Primarily medial ankle pain on right, with extension up her calf a significant amount--to posterolateral region of knee. Given her risk factors for DVT: age, high dose estrogen use, hx of vague hematologic disease in the past---I feel I need to at least check a D-dimer to see if any imaging is needed to eval for DVT. I believe her primary dx concerning her ankle and calf pain is tibialis posterior tendonitis/tenosynovitis.  I think osteoarthritis flare or gouty arthritis are much less likely based on her exam today. Prednisone 40mg  qd x 3d, then 20mg  qd x 3d, then 10mg  qd x 2d. Cam walker for right foot/leg rx'd, rest/elevation.  Iron deficiency anemia Diagnosed several years ago per pt.  Hx of extensive w/u by hematologist (including bone marrow biopsy and search for any site of occult bleeding); no cause found; failed oral supplement; gets fairly regular (q39m or so) IV iron infusions. Check CBC today.   An After Visit Summary was printed and given to the patient.  Return for f/u in 5-7 days to recheck ankles.

## 2012-10-17 ENCOUNTER — Encounter: Payer: Self-pay | Admitting: Family Medicine

## 2012-10-17 DIAGNOSIS — Z862 Personal history of diseases of the blood and blood-forming organs and certain disorders involving the immune mechanism: Secondary | ICD-10-CM | POA: Insufficient documentation

## 2012-10-17 DIAGNOSIS — M79669 Pain in unspecified lower leg: Secondary | ICD-10-CM | POA: Insufficient documentation

## 2012-10-17 NOTE — Assessment & Plan Note (Signed)
Mildly symptomatic, mildly abnormal urine, + hx of serious recurrent lower and upper UTI's per pt's report. Started cipro 500mg  bid x 5d today. Sent urine for c/s.

## 2012-10-17 NOTE — Assessment & Plan Note (Signed)
Diagnosed several years ago per pt.  Hx of extensive w/u by hematologist (including bone marrow biopsy and search for any site of occult bleeding); no cause found; failed oral supplement; gets fairly regular (q49m or so) IV iron infusions. Check CBC today.

## 2012-10-17 NOTE — Assessment & Plan Note (Addendum)
Primarily medial ankle pain on right, with extension up her calf a significant amount--to posterolateral region of knee. Given her risk factors for DVT: age, high dose estrogen use, hx of vague hematologic disease in the past---I feel I need to at least check a D-dimer to see if any imaging is needed to eval for DVT. I believe her primary dx concerning her ankle and calf pain is tibialis posterior tendonitis/tenosynovitis.  I think osteoarthritis flare or gouty arthritis are much less likely based on her exam today. Prednisone 40mg  qd x 3d, then 20mg  qd x 3d, then 10mg  qd x 2d. Cam walker for right foot/leg rx'd, rest/elevation.

## 2012-10-22 ENCOUNTER — Encounter: Payer: Self-pay | Admitting: Family Medicine

## 2012-10-22 ENCOUNTER — Ambulatory Visit (INDEPENDENT_AMBULATORY_CARE_PROVIDER_SITE_OTHER): Payer: Medicare Other | Admitting: Family Medicine

## 2012-10-22 VITALS — BP 155/80 | HR 78 | Ht 64.0 in | Wt 154.0 lb

## 2012-10-22 DIAGNOSIS — M76829 Posterior tibial tendinitis, unspecified leg: Secondary | ICD-10-CM

## 2012-10-22 DIAGNOSIS — N390 Urinary tract infection, site not specified: Secondary | ICD-10-CM | POA: Diagnosis not present

## 2012-10-22 DIAGNOSIS — I10 Essential (primary) hypertension: Secondary | ICD-10-CM

## 2012-10-22 MED ORDER — HYDROCHLOROTHIAZIDE 25 MG PO TABS: 25.0000 mg | ORAL_TABLET | Freq: Every day | ORAL | Status: DC

## 2012-10-22 MED ORDER — LISINOPRIL 40 MG PO TABS: 40.0000 mg | ORAL_TABLET | Freq: Every day | ORAL | Status: DC

## 2012-10-22 NOTE — Progress Notes (Signed)
OFFICE NOTE  10/25/2012  CC:  Chief Complaint  Patient presents with  . Follow-up    ankle edema; urine recheck     HPI: Patient is a 66 y.o. Caucasian female who is here for 6d f/u for her right ankle pain and UTI. She grew E coli that was pansensitive and she has taken 5d of cipro 500mg  bid.  She reports that her urinary sx's are 75% better and she will take 4-5 more days of cipro b/c of past hx of pyelo. Right ankle is MUCH improved.  She has been staying off of it most of the time lately and the last day or two back on it she feels like it is much better.  Still has some prednisone to take.  Pertinent PMH:  Past Medical History  Diagnosis Date  . HTN (hypertension)   . Depression   . Anxiety     with panic attacks  . DDD (degenerative disc disease), lumbar   . Osteoarthritis   . DDD (degenerative disc disease), cervical   . OSA (obstructive sleep apnea)   . Asthma     + asbestososis (father worked in shipyard with asbestos.  . Recurrent UTI   . Migraine syndrome   . Nephrolithiasis   . Hay fever   . Iron deficiency anemia     extensive w/u; no cause found; failed oral supplement;; gets fairly regular (q58m or so) IV iron infusions  . Mixed incontinence urge and stress   . Diverticular disease   . Insomnia   . Fibromyalgia     Patient states dx was around her late 88s but she had sx's for years prior to this.   MEDS:  Outpatient Prescriptions Prior to Visit  Medication Sig Dispense Refill  . ALPRAZolam (XANAX) 1 MG tablet Take 1 mg by mouth 4 (four) times daily.      [name] Kitchen amitriptyline (ELAVIL) 100 MG tablet Take 100 mg by mouth at bedtime.      [name] Kitchen aspirin EC 81 MG tablet Take 81 mg by mouth daily.      . budesonide-formoterol (SYMBICORT) 160-4.5 MCG/ACT inhaler Inhale 2 puffs into the lungs 2 (two) times daily.      . butalbital-acetaminophen-caffeine (FIORICET, ESGIC) 50-325-40 MG per tablet Take 1 tablet by mouth every 6 (six) hours as needed.      . ciprofloxacin  (CIPRO) 500 MG tablet Take 1 tablet (500 mg total) by mouth 2 (two) times daily.  10 tablet  0  . cyclobenzaprine (FLEXERIL) 10 MG tablet Take 10 mg by mouth 4 (four) times daily as needed.      . desvenlafaxine (PRISTIQ) 100 MG 24 hr tablet Take 100 mg by mouth daily.      [name] Kitchen estrogens, conjugated, (PREMARIN) 0.9 MG tablet Take 0.9 mg by mouth daily.      [name] Kitchen HYDROcodone-acetaminophen (LORTAB) 10-500 MG per tablet Take 1 tablet by mouth every 6 (six) hours as needed.      . meloxicam (MOBIC) 7.5 MG tablet Take 7.5 mg by mouth 2 (two) times daily.      . metoprolol succinate (TOPROL-XL) 100 MG 24 hr tablet Take 100 mg by mouth daily. Take with or immediately following a meal.      . predniSONE (DELTASONE) 20 MG tablet 2 tabs po qd x 3d, then 1 tab po qd x 3d, then 1/2 tab po qd x 2d, then stop  10 tablet  0  . pregabalin (LYRICA) 300 MG capsule Take 300 mg by  mouth 2 (two) times daily.      [name] Kitchen zolpidem (AMBIEN) 5 MG tablet Take 5 mg by mouth at bedtime as needed.      . Zolpidem Tartrate (INTERMEZZO) 3.5 MG SUBL Place 1 tablet under the tongue as needed.      [name] Kitchen lisinopril (PRINIVIL,ZESTRIL) 10 MG tablet Take 10 mg by mouth 3 (three) times daily.        PE: Blood pressure 155/80, pulse 78, height 5\' 4"  (1.626 m), weight 154 lb (69.854 kg). Gen: Alert, well appearing.  Patient is oriented to person, place, time, and situation. AFFECT: pleasant, lucid thought and speech. CV: RRR, no m/r/g.   LUNGS: CTA bilat, nonlabored resps, good aeration in all lung fields. EXT: right ankle with mild swelling and TTP in medial ankle soft tissues.  Lab: none today  Recent labs:  Lab Results  Component Value Date   WBC 3.9* 10/16/2012   HGB 12.0 10/16/2012   HCT 36.1 10/16/2012   MCV 92.5 10/16/2012   PLT 220.0 10/16/2012     Chemistry      Component Value Date/Time   NA 139 10/16/2012 1503   K 4.0 10/16/2012 1503   CL 104 10/16/2012 1503   CO2 27 10/16/2012 1503   BUN 15 10/16/2012 1503    CREATININE 0.6 10/16/2012 1503      Component Value Date/Time   CALCIUM 8.6 10/16/2012 1503   ALKPHOS 50 10/16/2012 1503   AST 25 10/16/2012 1503   ALT 21 10/16/2012 1503   BILITOT 0.3 10/16/2012 1503       IMPRESSION AND PLAN:  Tibialis posterior tendinitis Right ankle. Improving. Finish steroids.  Gradually increase activity as tolerated.  HTN (hypertension), benign Poor control per patient report and per our checks here. Increase lisinopril total dose to 40mg  (up from 10mg  tid) as well as ADD HCTZ 25mg  qd. Hold ASA until bp is under control.  UTI (lower urinary tract infection) Resolving appropriately. Finish 10d course of cipro.   An After Visit Summary was printed and given to the patient.  FOLLOW UP:  3 wks

## 2012-10-22 NOTE — Patient Instructions (Addendum)
Check your BP once daily for the next few weeks, bring numbers in for my review at f/u visit in 2-3 weeks.

## 2012-10-25 DIAGNOSIS — I10 Essential (primary) hypertension: Secondary | ICD-10-CM | POA: Insufficient documentation

## 2012-10-25 NOTE — Assessment & Plan Note (Signed)
Resolving appropriately. Finish 10d course of cipro.

## 2012-10-25 NOTE — Assessment & Plan Note (Addendum)
Poor control per patient report and per our checks here. Increase lisinopril total dose to 40mg  (up from 10mg  tid) as well as ADD HCTZ 25mg  qd. Hold ASA until bp is under control.

## 2012-10-25 NOTE — Assessment & Plan Note (Signed)
Right ankle. Improving. Finish steroids.  Gradually increase activity as tolerated.

## 2012-10-28 ENCOUNTER — Encounter: Payer: Self-pay | Admitting: Family Medicine

## 2012-11-04 DIAGNOSIS — M47812 Spondylosis without myelopathy or radiculopathy, cervical region: Secondary | ICD-10-CM | POA: Diagnosis not present

## 2012-11-04 DIAGNOSIS — M542 Cervicalgia: Secondary | ICD-10-CM | POA: Diagnosis not present

## 2012-11-04 DIAGNOSIS — M5412 Radiculopathy, cervical region: Secondary | ICD-10-CM | POA: Diagnosis not present

## 2012-11-05 ENCOUNTER — Encounter: Payer: Self-pay | Admitting: Family Medicine

## 2012-11-12 ENCOUNTER — Ambulatory Visit (INDEPENDENT_AMBULATORY_CARE_PROVIDER_SITE_OTHER): Payer: Medicare Other | Admitting: Family Medicine

## 2012-11-12 ENCOUNTER — Encounter: Payer: Self-pay | Admitting: Family Medicine

## 2012-11-12 VITALS — BP 121/77 | HR 75 | Ht 64.0 in | Wt 152.0 lb

## 2012-11-12 DIAGNOSIS — G4700 Insomnia, unspecified: Secondary | ICD-10-CM

## 2012-11-12 DIAGNOSIS — I10 Essential (primary) hypertension: Secondary | ICD-10-CM | POA: Diagnosis not present

## 2012-11-12 MED ORDER — ZOLPIDEM TARTRATE 10 MG PO TABS: 10.0000 mg | ORAL_TABLET | Freq: Every evening | ORAL | Status: DC | PRN

## 2012-11-12 MED ORDER — ALPRAZOLAM 1 MG PO TABS: 1.0000 mg | ORAL_TABLET | Freq: Four times a day (QID) | ORAL | Status: DC

## 2012-11-12 MED ORDER — METOPROLOL SUCCINATE ER 50 MG PO TB24: ORAL_TABLET | ORAL | Status: DC

## 2012-11-12 NOTE — Assessment & Plan Note (Signed)
Still poor control as per home bp measurements.  HR is OK to push her toprol XL, so I'll add 50mg  tab to her 100 mg tab once daily. Continue bp monitoring at home and recheck in office in 3-4 weeks.

## 2012-11-12 NOTE — Progress Notes (Signed)
OFFICE NOTE  11/12/2012  CC:  Chief Complaint  Patient presents with  . Follow-up    blood pressure     HPI: Patient is a 66 y.o. Caucasian female who is here for 2 wk f/u for poorly controlled HTN. Last visit I added 25mg  HCTZ and increased her lisinopril from 30mg  qd to 40mg  qd.  She is tolerating these meds fine and denies any acute complaint. She shows me about 8 bp measurements taken by her upper arm bp cuff at home since her last visit and they are all either stage 1 or 2 HTN range.  She does not take note of her pulse. She asks for RF of her xanax and zolpidem today.  Her previous PMD did not RF these prior to her transfer here, so she is running out.  She says she has been on zolpidem 10mg  long term plus an additional zolpidem ER 3.5mg  tab qhs and she denies feeling any side effects, even says she still has mild insomnia despite taking these meds.   Xanax 1 qid is for her anxiety apparently and she also reports being on this long term without side effect.   Pertinent PMH:  Uncontrolled HTN Iron def anemia Migraine HAs Anxiety/depression Insomnia  MEDS:  Outpatient Prescriptions Prior to Visit  Medication Sig Dispense Refill  . amitriptyline (ELAVIL) 100 MG tablet Take 100 mg by mouth at bedtime.      [name] Kitchen aspirin EC 81 MG tablet Take 81 mg by mouth daily.      . butalbital-acetaminophen-caffeine (FIORICET, ESGIC) 50-325-40 MG per tablet Take 1 tablet by mouth every 6 (six) hours as needed.      . cyclobenzaprine (FLEXERIL) 10 MG tablet Take 10 mg by mouth 4 (four) times daily as needed.      . desvenlafaxine (PRISTIQ) 100 MG 24 hr tablet Take 100 mg by mouth daily.      [name] Kitchen estrogens, conjugated, (PREMARIN) 0.9 MG tablet Take 0.9 mg by mouth daily.      . hydrochlorothiazide (HYDRODIURIL) 25 MG tablet Take 1 tablet (25 mg total) by mouth daily.  30 tablet  1  . HYDROcodone-acetaminophen (LORTAB) 10-500 MG per tablet Take 1 tablet by mouth every 6 (six) hours as needed.      [name] Kitchen  lisinopril (PRINIVIL,ZESTRIL) 40 MG tablet Take 1 tablet (40 mg total) by mouth daily.  30 tablet  1  . meloxicam (MOBIC) 7.5 MG tablet Take 7.5 mg by mouth 2 (two) times daily.      . metoprolol succinate (TOPROL-XL) 100 MG 24 hr tablet Take 100 mg by mouth daily. Take with or immediately following a meal.      . pregabalin (LYRICA) 300 MG capsule Take 300 mg by mouth 2 (two) times daily.      . Zolpidem Tartrate (INTERMEZZO) 3.5 MG SUBL Place 1 tablet under the tongue as needed.      . ALPRAZolam (XANAX) 1 MG tablet Take 1 mg by mouth 4 (four) times daily.      . budesonide-formoterol (SYMBICORT) 160-4.5 MCG/ACT inhaler Inhale 2 puffs into the lungs 2 (two) times daily.      . ciprofloxacin (CIPRO) 500 MG tablet Take 1 tablet (500 mg total) by mouth 2 (two) times daily.  10 tablet  0  . predniSONE (DELTASONE) 20 MG tablet 2 tabs po qd x 3d, then 1 tab po qd x 3d, then 1/2 tab po qd x 2d, then stop  10 tablet  0  . zolpidem (AMBIEN)  5 MG tablet Take 5 mg by mouth at bedtime as needed.      Ambien 5mg  above is entry error.  Pt is no longer on prednisone.  PE: Blood pressure 121/77, pulse 75, height 5\' 4"  (1.626 m), weight 152 lb (68.947 kg). Gen: Alert, well appearing.  Patient is oriented to person, place, time, and situation. AFFECT: pleasant, lucid thought and speech. CV: RRR, no m/r/g.   LUNGS: CTA bilat, nonlabored resps, good aeration in all lung fields. EXT: no clubbing, cyanosis, or edema.    IMPRESSION AND PLAN:  HTN (hypertension), benign Still poor control as per home bp measurements.  HR is OK to push her toprol XL, so I'll add 50mg  tab to her 100 mg tab once daily. Continue bp monitoring at home and recheck in office in 3-4 weeks.     Insomnia We discussed the fact that 5mg  dosing of zolpidem should be the max in anyone over age 64 yrs, but she reiterated that she has been on this long term without adverse effect WITH xanax 1mg  qid.  After discussion, I decided to RF meds  today.   An After Visit Summary was printed and given to the patient.  FOLLOW UP: 3-4 wks

## 2012-11-12 NOTE — Assessment & Plan Note (Signed)
We discussed the fact that 5mg  dosing of zolpidem should be the max in anyone over age 66 yrs, but she reiterated that she has been on this long term without adverse effect WITH xanax 1mg  qid.  After discussion, I decided to RF meds today.

## 2012-12-07 ENCOUNTER — Encounter: Payer: Self-pay | Admitting: Family Medicine

## 2012-12-07 ENCOUNTER — Ambulatory Visit (INDEPENDENT_AMBULATORY_CARE_PROVIDER_SITE_OTHER): Payer: Medicare Other | Admitting: Family Medicine

## 2012-12-07 VITALS — BP 109/64 | HR 74 | Ht 64.0 in | Wt 151.0 lb

## 2012-12-07 DIAGNOSIS — G44219 Episodic tension-type headache, not intractable: Secondary | ICD-10-CM | POA: Diagnosis not present

## 2012-12-07 DIAGNOSIS — I10 Essential (primary) hypertension: Secondary | ICD-10-CM

## 2012-12-07 MED ORDER — HYDROCHLOROTHIAZIDE 25 MG PO TABS: 25.0000 mg | ORAL_TABLET | Freq: Every day | ORAL | Status: DC

## 2012-12-07 MED ORDER — AMITRIPTYLINE HCL 100 MG PO TABS: 100.0000 mg | ORAL_TABLET | Freq: Every day | ORAL | Status: DC

## 2012-12-07 MED ORDER — BUTALBITAL-APAP-CAFFEINE 50-325-40 MG PO TABS: 1.0000 | ORAL_TABLET | Freq: Four times a day (QID) | ORAL | Status: DC | PRN

## 2012-12-07 MED ORDER — METOPROLOL SUCCINATE ER 50 MG PO TB24: ORAL_TABLET | ORAL | Status: DC

## 2012-12-07 MED ORDER — METOPROLOL SUCCINATE ER 100 MG PO TB24: 100.0000 mg | ORAL_TABLET | Freq: Every day | ORAL | Status: DC

## 2012-12-07 MED ORDER — LISINOPRIL 40 MG PO TABS: 40.0000 mg | ORAL_TABLET | Freq: Every day | ORAL | Status: DC

## 2012-12-07 NOTE — Assessment & Plan Note (Signed)
She states she would be taking avg of 3 tabs per day of fioricet.  We talked about the risks/benefits of this med and frequent use, etc. We decided to go ahead and use like this for the next couple of months--see how much she actually requires for 2 consecutive months.  If she does actually need 3 per day avg then I recommended consideration of increase in her amitriptyline vs add on topamax as HA prophylaxis trial. She was agreeable to this.

## 2012-12-07 NOTE — Assessment & Plan Note (Signed)
Stable now. Continue current meds at current doses, RF's given today. Continue to monitor bp at home periodically, call if not avg <140/90.

## 2012-12-07 NOTE — Progress Notes (Signed)
OFFICE NOTE  12/07/2012  CC:  Chief Complaint  Patient presents with  . Follow-up    HTN     HPI: Patient is a 66 y.o. Caucasian female who is here for 3 wk f/u of poorly controlled HTN.   Last visit I added 50mg  Toprol XL to her 100mg  she was already taking at that time.  Prior to that I had titrated up her lisinopril and her HCTZ.   Since last visit, her home BPs 130-160 systolic and 90-100 diastolic.  Avg 140/85.  Pain in neck 2/2 cervical spondylosis, spreading to occiput region when tense--like lately.  Also recent increased stress with [place] from [place].  Daughter and family stayed with her recently due to granddaughter's wedding last weekend.  Big birthday dinner planned tonight for husband and son.   She has taken fioricet recently from her old PMD--10 day trial --and these helped her without causing impairment or sedation.  No nausea or sensitivity to light or sound like her usual migrainous type HAs.    Pertinent PMH:  HTN Migraine HA's GAD and depression Insomnia Tension HA's Fibromyalgia Asthma Cervical and lumbar DDD  Past surgical, social, and family history reviewed and no changes noted since last office visit.  MEDS:  Outpatient Prescriptions Prior to Visit  Medication Sig Dispense Refill  . ALPRAZolam (XANAX) 1 MG tablet Take 1 tablet (1 mg total) by mouth 4 (four) times daily.  120 tablet  5  . budesonide-formoterol (SYMBICORT) 160-4.5 MCG/ACT inhaler Inhale 2 puffs into the lungs 2 (two) times daily.      . cyclobenzaprine (FLEXERIL) 10 MG tablet Take 10 mg by mouth 4 (four) times daily as needed.      . desvenlafaxine (PRISTIQ) 100 MG 24 hr tablet Take 100 mg by mouth daily.      [name] Kitchen estrogens, conjugated, (PREMARIN) 0.9 MG tablet Take 0.9 mg by mouth daily.      [name] Kitchen HYDROcodone-acetaminophen (LORTAB) 10-500 MG per tablet Take 1 tablet by mouth every 6 (six) hours as needed.      . meloxicam (MOBIC) 7.5 MG tablet Take 7.5 mg by mouth 2 (two) times  daily.      . pregabalin (LYRICA) 300 MG capsule Take 300 mg by mouth 2 (two) times daily.      [name] Kitchen zolpidem (AMBIEN) 10 MG tablet Take 1 tablet (10 mg total) by mouth at bedtime as needed.  30 tablet  5  . Zolpidem Tartrate (INTERMEZZO) 3.5 MG SUBL Place 1 tablet under the tongue as needed.      . [DISCONTINUED] amitriptyline (ELAVIL) 100 MG tablet Take 100 mg by mouth at bedtime.      . [DISCONTINUED] butalbital-acetaminophen-caffeine (FIORICET, ESGIC) 50-325-40 MG per tablet Take 1 tablet by mouth every 6 (six) hours as needed.      . [DISCONTINUED] hydrochlorothiazide (HYDRODIURIL) 25 MG tablet Take 1 tablet (25 mg total) by mouth daily.  30 tablet  1  . [DISCONTINUED] lisinopril (PRINIVIL,ZESTRIL) 40 MG tablet Take 1 tablet (40 mg total) by mouth daily.  30 tablet  1  . [DISCONTINUED] metoprolol succinate (TOPROL-XL) 100 MG 24 hr tablet Take 100 mg by mouth daily. Take with or immediately following a meal.      . [DISCONTINUED] metoprolol succinate (TOPROL-XL) 50 MG 24 hr tablet Take with 100mg  Toprol XL for total dose of 150mg  once daily dosing  30 tablet  1  . aspirin EC 81 MG tablet Take 81 mg by mouth daily.  Last reviewed on 12/07/2012 10:39 AM by Jeoffrey Massed, MD  PE: Blood pressure 109/64, pulse 74, height 5\' 4"  (1.626 m), weight 151 lb (68.493 kg). Gen: Alert, well appearing.  Patient is oriented to person, place, time, and situation. AFFECT: pleasant, lucid thought and speech. CV: RRR, no m/r/g.   LUNGS: CTA bilat, nonlabored resps, good aeration in all lung fields. EXT: no clubbing, cyanosis, or edema.   LAB: none today  IMPRESSION AND PLAN:  HTN (hypertension), benign Stable now. Continue current meds at current doses, RF's given today. Continue to monitor bp at home periodically, call if not avg <140/90.  Headache, tension type, episodic She states she would be taking avg of 3 tabs per day of fioricet.  We talked about the risks/benefits of this med and frequent  use, etc. We decided to go ahead and use like this for the next couple of months--see how much she actually requires for 2 consecutive months.  If she does actually need 3 per day avg then I recommended consideration of increase in her amitriptyline vs add on topamax as HA prophylaxis trial. She was agreeable to this.  An After Visit Summary was printed and given to the patient.  25 min spent with pt today, with >50% of this time spent in counseling patient regarding her stress, tension headaches, and her HTN.    FOLLOW UP: 2 mo

## 2012-12-09 ENCOUNTER — Ambulatory Visit: Payer: Medicare Other | Admitting: Family Medicine

## 2012-12-10 ENCOUNTER — Encounter: Payer: Self-pay | Admitting: Family Medicine

## 2012-12-10 ENCOUNTER — Ambulatory Visit (INDEPENDENT_AMBULATORY_CARE_PROVIDER_SITE_OTHER): Payer: Medicare Other | Admitting: Family Medicine

## 2012-12-10 VITALS — BP 110/72 | HR 79 | Temp 97.7°F | Ht 64.0 in | Wt 150.0 lb

## 2012-12-10 DIAGNOSIS — H8110 Benign paroxysmal vertigo, unspecified ear: Secondary | ICD-10-CM | POA: Diagnosis not present

## 2012-12-10 NOTE — Progress Notes (Signed)
OFFICE NOTE  12/10/2012  CC:  Chief Complaint  Patient presents with  . Dizziness    X 3 days     HPI: Patient is a 66 y.o. Caucasian female who is here for dizziness.   Onset of vertigo upon getting out of bed 2 mornings ago--severe sensation of the room moving in circles around her.  All that day it was triggered by any shifting of position, +nauseated all day initially, then yesterday her nausea went away--she took promethazine once--nausea still gone now.  Vertigo occurring less frequently, harder to provoke in the last 24h.  In between episodes her head feels "swimmy".  Denies hearing deficit or ringing in ears.  She has been having headaches with this. She has had positional vertigo before, last episode was years ago.     Pertinent PMH:  HTN DDD Asthma Insomnia +Hx of BPPV No hx of CVA  Past medical, surgical, social, and family history reviewed and no changes noted since last office visit.  MEDS:  Outpatient Prescriptions Prior to Visit  Medication Sig Dispense Refill  . ALPRAZolam (XANAX) 1 MG tablet Take 1 tablet (1 mg total) by mouth 4 (four) times daily.  120 tablet  5  . amitriptyline (ELAVIL) 100 MG tablet Take 1 tablet (100 mg total) by mouth at bedtime.  90 tablet  3  . aspirin EC 81 MG tablet Take 81 mg by mouth daily.      . budesonide-formoterol (SYMBICORT) 160-4.5 MCG/ACT inhaler Inhale 2 puffs into the lungs 2 (two) times daily.      . butalbital-acetaminophen-caffeine (FIORICET, ESGIC) 50-325-40 MG per tablet Take 1 tablet by mouth every 6 (six) hours as needed.  90 tablet  1  . cyclobenzaprine (FLEXERIL) 10 MG tablet Take 10 mg by mouth 4 (four) times daily as needed.      . desvenlafaxine (PRISTIQ) 100 MG 24 hr tablet Take 100 mg by mouth daily.      [name] Kitchen estrogens, conjugated, (PREMARIN) 0.9 MG tablet Take 0.9 mg by mouth daily.      . hydrochlorothiazide (HYDRODIURIL) 25 MG tablet Take 1 tablet (25 mg total) by mouth daily.  90 tablet  3  .  HYDROcodone-acetaminophen (LORTAB) 10-500 MG per tablet Take 1 tablet by mouth every 6 (six) hours as needed.      [name] Kitchen lisinopril (PRINIVIL,ZESTRIL) 40 MG tablet Take 1 tablet (40 mg total) by mouth daily.  90 tablet  3  . meloxicam (MOBIC) 7.5 MG tablet Take 7.5 mg by mouth 2 (two) times daily.      . metoprolol succinate (TOPROL-XL) 100 MG 24 hr tablet Take 1 tablet (100 mg total) by mouth daily. Take with or immediately following a meal.  90 tablet  3  . metoprolol succinate (TOPROL-XL) 50 MG 24 hr tablet Take with 100mg  Toprol XL for total dose of 150mg  once daily dosing  90 tablet  3  . pregabalin (LYRICA) 300 MG capsule Take 300 mg by mouth 2 (two) times daily.      [name] Kitchen zolpidem (AMBIEN) 10 MG tablet Take 1 tablet (10 mg total) by mouth at bedtime as needed.  30 tablet  5  . Zolpidem Tartrate (INTERMEZZO) 3.5 MG SUBL Place 1 tablet under the tongue as needed.       Last reviewed on 12/10/2012  2:24 PM by Jeoffrey Massed, MD  PE: Blood pressure 110/72, pulse 79, temperature 97.7 F (36.5 C), temperature source Temporal, height 5\' 4"  (1.626 m), weight 150 lb (68.04 kg),  SpO2 97.00%. Gen: Alert, tired appearing.  Patient is oriented to person, place, time, and situation.  She could move but had to have at least minimal support (the wall, a person's shoulder or arm) for support. CV: RRR, no m/r/g.   LUNGS: CTA bilat, nonlabored resps, good aeration in all lung fields. Neuro: CN 2-12 intact bilaterally, strength 5/5 in proximal and distal upper extremities and lower extremities bilaterally.  No sensory deficits.  No tremor.  No disdiadochokinesis.  No ataxia.  Upper extremity and lower extremity DTRs symmetric.  No pronator drift. Dix-Halpike maneuvers: positive for inducing vertigo and rotary nystagmus with head turned either to the right 45 deg or to left 45 deg.  IMPRESSION AND PLAN:  BPPV. Epley's maneuvers for home therapy were reviewed in office in detail and paper handout of these  maneuvers was given to pt while in office today.  I did give a rx for meclizine to see if it would help a bit of her lightheaded feeling in between her vertiginous attacks.    An After Visit Summary was printed and given to the patient.   FOLLOW UP: 4d

## 2012-12-10 NOTE — Patient Instructions (Signed)
Buy OTC med called meclizine and take as directed. Do your home Epley's maneuvers as instructed.

## 2012-12-14 ENCOUNTER — Ambulatory Visit (INDEPENDENT_AMBULATORY_CARE_PROVIDER_SITE_OTHER): Payer: Medicare Other | Admitting: Family

## 2012-12-14 ENCOUNTER — Ambulatory Visit: Payer: Medicare Other | Admitting: Family Medicine

## 2012-12-14 ENCOUNTER — Other Ambulatory Visit: Payer: Self-pay | Admitting: Family

## 2012-12-14 ENCOUNTER — Ambulatory Visit (HOSPITAL_BASED_OUTPATIENT_CLINIC_OR_DEPARTMENT_OTHER)
Admission: RE | Admit: 2012-12-14 | Discharge: 2012-12-14 | Disposition: A | Discharge status: Home / Self Care | Payer: Medicare Other | Source: Ambulatory Visit | Attending: Family | Admitting: Family

## 2012-12-14 ENCOUNTER — Encounter: Payer: Self-pay | Admitting: Family

## 2012-12-14 ENCOUNTER — Telehealth: Payer: Self-pay | Admitting: Family

## 2012-12-14 VITALS — BP 118/86 | HR 78 | Temp 98.6°F | Resp 16 | Wt 155.0 lb

## 2012-12-14 DIAGNOSIS — I10 Essential (primary) hypertension: Secondary | ICD-10-CM

## 2012-12-14 DIAGNOSIS — R42 Dizziness and giddiness: Secondary | ICD-10-CM | POA: Insufficient documentation

## 2012-12-14 DIAGNOSIS — D649 Anemia, unspecified: Secondary | ICD-10-CM | POA: Diagnosis not present

## 2012-12-14 LAB — BASIC METABOLIC PANEL
Calcium: 9 mg/dL (ref 8.4–10.5)
Creat: 1.05 mg/dL (ref 0.50–1.10)

## 2012-12-14 LAB — CBC WITH DIFFERENTIAL/PLATELET
Basophils Relative: 1 % (ref 0–1)
Hemoglobin: 11.1 g/dL — ABNORMAL LOW (ref 12.0–15.0)
Lymphocytes Relative: 39 % (ref 12–46)
Lymphs Abs: 2 10*3/uL (ref 0.7–4.0)
Monocytes Relative: 8 % (ref 3–12)
Neutro Abs: 2.6 10*3/uL (ref 1.7–7.7)
Neutrophils Relative %: 50 % (ref 43–77)
RBC: 3.72 MIL/uL — ABNORMAL LOW (ref 3.87–5.11)
WBC: 5.1 10*3/uL (ref 4.0–10.5)

## 2012-12-14 NOTE — Telephone Encounter (Signed)
Reviewed CT results with patient.  Neg for CVA.

## 2012-12-14 NOTE — Patient Instructions (Addendum)
Please complete your CT on the first floor.  Continue to hold the morning dose of Toprol xl, continue your evening 100mg  dose. Hold HCTZ. Continue to check your blood pressure once daily. Call if BP< 100/50 or >160/100. Follow up with Dr. Milinda Cave in 2 weeks.  Go to the ED if you develop numbness/weakness, slurred speech or facial drooping.

## 2012-12-14 NOTE — Progress Notes (Signed)
Subjective:    Patient ID: Linda [name], female    DOB: [birth year], 66 y.o.   MRN: [id number]  HPI  Got out of bed on Tuesday AM got up and fell.  Tried to get up again, fell again.  Leaned against the wall in the hall to go to the [place] ot tell her husband that she could not stand.  She reports that the who day she had trouble standing.  Wednesday she had constant nausea/headache.  She went see Dr. Milinda Cave on Thursday.  She was treated with Meclizine. She has been doing the prescribed exercises.  Over the weekend she reports that she continues to improve a bit each day. She is using a walker for balance if there is nobody around.  She continues to have some dizziness with movement. She reports that meclizine has not helped, but overall she has gotten better.    She reports that her BP has been "constantly low at home."   HTN-  She reports that  She continues lisinopril 40mg ,  Toprol xl she is taking 100mg  at night and has been holding the AM 50mg  tab.  She continues hctz once daily.    Pt reports dizziness and nausea since last week. Noted weakness in both legs. Difficulty gripping objects with her right hand. Also noted mild persistent headache prior to the dizziness; headache increased after dizziness started. Pt has been taking meclizine without resolution of symptoms. Pt reports recent BP reading at home of 88/58.    Review of Systems See HPI    History   Social History  . Marital Status: Married    Spouse Name: N/A    Number of Children: N/A  . Years of Education: N/A   Occupational History  . Not on file.   Social History Main Topics  . Smoking status: Never Smoker   . Smokeless tobacco: Never Used  . Alcohol Use: No  . Drug Use: No  . Sexually Active: Not on file   Other Topics Concern  . Not on file   Social History Narrative   Married, 2 sons.  Relocated to [place] 09/2012 to be closer to her son who has MS.Occupation: Runner, broadcasting/film/video.Education: masters degree level.No  T/A/Ds.    Past Surgical History  Procedure Date  . Appendectomy 1960  . Total abdominal hysterectomy 1974  . Tendon release 1996    Right forearm and hand  . Knee surgery 2005  . Heel spur surgery 2008    left foot  . Plantar fascia release 2008    left  . Axillary surgery 1978    Multiple "lump" in armpit per pt  . Coccyx removal 1972    Family History  Problem Relation Age of Onset  . Arthritis Mother   . Kidney disease Mother   . Heart disease Father   . Stroke Father   . Hypertension Father   . Diabetes Father     Allergies  Allergen Reactions  . Penicillins Itching, Swelling and Rash    Current Outpatient Prescriptions on File Prior to Visit  Medication Sig Dispense Refill  . ALPRAZolam (XANAX) 1 MG tablet Take 1 tablet (1 mg total) by mouth 4 (four) times daily.  120 tablet  5  . amitriptyline (ELAVIL) 100 MG tablet Take 1 tablet (100 mg total) by mouth at bedtime.  90 tablet  3  . budesonide-formoterol (SYMBICORT) 160-4.5 MCG/ACT inhaler Inhale 2 puffs into the lungs 2 (two) times daily.      . butalbital-acetaminophen-caffeine (  FIORICET, ESGIC) 50-325-40 MG per tablet Take 1 tablet by mouth every 6 (six) hours as needed.  90 tablet  1  . cyclobenzaprine (FLEXERIL) 10 MG tablet Take 10 mg by mouth 4 (four) times daily as needed.      . desvenlafaxine (PRISTIQ) 100 MG 24 hr tablet Take 100 mg by mouth daily.      [name] Kitchen estrogens, conjugated, (PREMARIN) 0.9 MG tablet Take 0.9 mg by mouth daily.      . hydrochlorothiazide (HYDRODIURIL) 25 MG tablet Take 1 tablet (25 mg total) by mouth daily.  90 tablet  3  . HYDROcodone-acetaminophen (LORTAB) 10-500 MG per tablet Take 1 tablet by mouth every 6 (six) hours as needed.      [name] Kitchen lisinopril (PRINIVIL,ZESTRIL) 40 MG tablet Take 1 tablet (40 mg total) by mouth daily.  90 tablet  3  . meloxicam (MOBIC) 7.5 MG tablet Take 7.5 mg by mouth 2 (two) times daily.      . metoprolol succinate (TOPROL-XL) 100 MG 24 hr tablet Take 1  tablet (100 mg total) by mouth daily. Take with or immediately following a meal.  90 tablet  3  . metoprolol succinate (TOPROL-XL) 50 MG 24 hr tablet Take with 100mg  Toprol XL for total dose of 150mg  once daily dosing  90 tablet  3  . pregabalin (LYRICA) 300 MG capsule Take 300 mg by mouth 2 (two) times daily.      [name] Kitchen zolpidem (AMBIEN) 10 MG tablet Take 1 tablet (10 mg total) by mouth at bedtime as needed.  30 tablet  5  . Zolpidem Tartrate (INTERMEZZO) 3.5 MG SUBL Place 1 tablet under the tongue as needed.      [name] Kitchen aspirin EC 81 MG tablet Take 81 mg by mouth daily.        BP 118/86  Pulse 78  Temp 98.6 F (37 C) (Oral)  Resp 16  Wt 155 lb 0.6 oz (70.326 kg)  SpO2 97%         Objective:   Physical Exam  Constitutional: She is oriented to person, place, and time. She appears well-developed and well-nourished. No distress.  HENT:  Head: Normocephalic and atraumatic.  Eyes: No scleral icterus.  Cardiovascular: Normal rate and regular rhythm.   No murmur heard. Pulmonary/Chest: Effort normal and breath sounds normal. No respiratory distress. She has no wheezes. She has no rales. She exhibits no tenderness.  Neurological: She is alert and oriented to person, place, and time.       Strong equal hand grasps Bilateral UE/LE strength is 5/5 + facial symmetry  Psychiatric: She has a normal mood and affect. Her behavior is normal. Judgment and thought content normal.          Assessment & Plan:

## 2012-12-15 DIAGNOSIS — R42 Dizziness and giddiness: Secondary | ICD-10-CM | POA: Insufficient documentation

## 2012-12-15 NOTE — Assessment & Plan Note (Addendum)
CT head was performed to exclude CVA and is negative.  At this point, it seems that her symptoms are slowly improving.  Continue PRN meclizine.  She is mildly orthostatic and I have asked her to hold hctz for now. Continue prn meclizine.

## 2012-12-15 NOTE — Assessment & Plan Note (Signed)
Pt instructed to continue to hold the morning dose of Toprol xl, continue evening 100mg  dose. Hold HCTZ. Continue to check blood pressure once daily. Call if BP< 100/50 or >160/100. Follow up with Dr. Milinda Cave in 2 weeks.

## 2012-12-16 DIAGNOSIS — H8110 Benign paroxysmal vertigo, unspecified ear: Secondary | ICD-10-CM | POA: Insufficient documentation

## 2012-12-16 HISTORY — DX: Benign paroxysmal vertigo, unspecified ear: H81.10

## 2012-12-18 ENCOUNTER — Telehealth: Payer: Self-pay | Admitting: *Deleted

## 2012-12-18 NOTE — Telephone Encounter (Signed)
LMOM with contact name & number for return call RE: results & further provider instructions [will leave IFob at front desk for pt]/SLS

## 2012-12-18 NOTE — Telephone Encounter (Signed)
Message copied by Regis Bill on Fri Dec 18, 2012  9:48 AM ------      Message from: O'SULLIVAN, MELISSA      Created: Fri Dec 18, 2012  9:18 AM       Pls contact pt and let her know that her blood work shows a mild anemia.  I would like to add on the following labs please, serum iron, folate and b12 (diagnosis is anemia).  Also, I would like her to complete and return an IFOB kit- dx is anemia.

## 2012-12-22 ENCOUNTER — Other Ambulatory Visit: Payer: Self-pay | Admitting: *Deleted

## 2012-12-22 MED ORDER — DESVENLAFAXINE SUCCINATE ER 100 MG PO TB24: 100.0000 mg | ORAL_TABLET | Freq: Every day | ORAL | Status: DC

## 2012-12-25 NOTE — Telephone Encounter (Signed)
Notes Recorded by Regis Bill, CMA on 12/25/2012 at 5:15 PM Patient informed, understood & agreed; will p/u & return IFob via PCP/DR. McGowen's office [per pt request]; will advise Stephanie/SLS ------

## 2012-12-28 ENCOUNTER — Other Ambulatory Visit: Payer: Self-pay | Admitting: Family Medicine

## 2012-12-28 DIAGNOSIS — D649 Anemia, unspecified: Secondary | ICD-10-CM

## 2012-12-28 NOTE — Progress Notes (Signed)
Noted  

## 2012-12-28 NOTE — Progress Notes (Signed)
IFob left at front desk.

## 2013-01-14 DIAGNOSIS — E559 Vitamin D deficiency, unspecified: Secondary | ICD-10-CM | POA: Diagnosis not present

## 2013-01-14 DIAGNOSIS — M5137 Other intervertebral disc degeneration, lumbosacral region: Secondary | ICD-10-CM | POA: Diagnosis not present

## 2013-01-14 DIAGNOSIS — M1710 Unilateral primary osteoarthritis, unspecified knee: Secondary | ICD-10-CM | POA: Diagnosis not present

## 2013-01-14 DIAGNOSIS — M810 Age-related osteoporosis without current pathological fracture: Secondary | ICD-10-CM | POA: Diagnosis not present

## 2013-01-23 HISTORY — PX: CARDIAC CATHETERIZATION: SHX172

## 2013-01-30 ENCOUNTER — Emergency Department (HOSPITAL_COMMUNITY): Payer: Medicare Other

## 2013-01-30 ENCOUNTER — Other Ambulatory Visit: Payer: Self-pay

## 2013-01-30 ENCOUNTER — Encounter (HOSPITAL_COMMUNITY): Payer: Self-pay | Admitting: Emergency Medicine

## 2013-01-30 ENCOUNTER — Inpatient Hospital Stay (HOSPITAL_COMMUNITY): Payer: Medicare Other

## 2013-01-30 ENCOUNTER — Ambulatory Visit (HOSPITAL_COMMUNITY): Admit: 2013-01-30 | Payer: Self-pay | Admitting: Cardiology

## 2013-01-30 ENCOUNTER — Inpatient Hospital Stay (HOSPITAL_COMMUNITY)
Admission: EM | Admit: 2013-01-30 | Discharge: 2013-01-31 | DRG: 286 | Disposition: A | Discharge status: Home / Self Care | Payer: Medicare Other | Attending: Cardiology | Admitting: Cardiology

## 2013-01-30 ENCOUNTER — Encounter (HOSPITAL_COMMUNITY)
Admission: EM | Disposition: A | Discharge status: Home / Self Care | Payer: Self-pay | Source: Home / Self Care | Attending: Cardiology

## 2013-01-30 DIAGNOSIS — R079 Chest pain, unspecified: Principal | ICD-10-CM | POA: Diagnosis present

## 2013-01-30 DIAGNOSIS — J61 Pneumoconiosis due to asbestos and other mineral fibers: Secondary | ICD-10-CM | POA: Diagnosis present

## 2013-01-30 DIAGNOSIS — R51 Headache: Secondary | ICD-10-CM | POA: Diagnosis not present

## 2013-01-30 DIAGNOSIS — R578 Other shock: Secondary | ICD-10-CM | POA: Diagnosis not present

## 2013-01-30 DIAGNOSIS — R509 Fever, unspecified: Secondary | ICD-10-CM | POA: Diagnosis not present

## 2013-01-30 DIAGNOSIS — R404 Transient alteration of awareness: Secondary | ICD-10-CM | POA: Diagnosis not present

## 2013-01-30 DIAGNOSIS — I200 Unstable angina: Secondary | ICD-10-CM | POA: Diagnosis not present

## 2013-01-30 DIAGNOSIS — I959 Hypotension, unspecified: Secondary | ICD-10-CM | POA: Diagnosis not present

## 2013-01-30 DIAGNOSIS — F329 Major depressive disorder, single episode, unspecified: Secondary | ICD-10-CM | POA: Diagnosis present

## 2013-01-30 DIAGNOSIS — I10 Essential (primary) hypertension: Secondary | ICD-10-CM | POA: Diagnosis present

## 2013-01-30 DIAGNOSIS — R55 Syncope and collapse: Secondary | ICD-10-CM | POA: Diagnosis not present

## 2013-01-30 DIAGNOSIS — R579 Shock, unspecified: Secondary | ICD-10-CM | POA: Diagnosis not present

## 2013-01-30 DIAGNOSIS — IMO0001 Reserved for inherently not codable concepts without codable children: Secondary | ICD-10-CM | POA: Diagnosis present

## 2013-01-30 DIAGNOSIS — G4733 Obstructive sleep apnea (adult) (pediatric): Secondary | ICD-10-CM | POA: Diagnosis present

## 2013-01-30 DIAGNOSIS — M542 Cervicalgia: Secondary | ICD-10-CM | POA: Diagnosis not present

## 2013-01-30 DIAGNOSIS — S0993XA Unspecified injury of face, initial encounter: Secondary | ICD-10-CM | POA: Diagnosis not present

## 2013-01-30 DIAGNOSIS — S0100XA Unspecified open wound of scalp, initial encounter: Secondary | ICD-10-CM | POA: Diagnosis not present

## 2013-01-30 DIAGNOSIS — F411 Generalized anxiety disorder: Secondary | ICD-10-CM | POA: Diagnosis present

## 2013-01-30 DIAGNOSIS — S298XXA Other specified injuries of thorax, initial encounter: Secondary | ICD-10-CM | POA: Diagnosis not present

## 2013-01-30 DIAGNOSIS — F3289 Other specified depressive episodes: Secondary | ICD-10-CM | POA: Diagnosis present

## 2013-01-30 DIAGNOSIS — I249 Acute ischemic heart disease, unspecified: Secondary | ICD-10-CM

## 2013-01-30 DIAGNOSIS — IMO0002 Reserved for concepts with insufficient information to code with codable children: Secondary | ICD-10-CM

## 2013-01-30 DIAGNOSIS — S0990XA Unspecified injury of head, initial encounter: Secondary | ICD-10-CM | POA: Diagnosis not present

## 2013-01-30 HISTORY — PX: LEFT HEART CATHETERIZATION WITH CORONARY ANGIOGRAM: SHX5451

## 2013-01-30 LAB — RAPID URINE DRUG SCREEN, HOSP PERFORMED
Amphetamines: NOT DETECTED
Cocaine: NOT DETECTED
Opiates: NOT DETECTED
Tetrahydrocannabinol: NOT DETECTED

## 2013-01-30 LAB — CBC WITH DIFFERENTIAL/PLATELET
Basophils Absolute: 0 10*3/uL (ref 0.0–0.1)
Basophils Relative: 0 % (ref 0–1)
Eosinophils Absolute: 0 10*3/uL (ref 0.0–0.7)
Eosinophils Relative: 2 % (ref 0–5)
HCT: 36.7 % (ref 36.0–46.0)
Lymphocytes Relative: 30 % (ref 12–46)
Lymphs Abs: 2.7 10*3/uL (ref 0.7–4.0)
Lymphs Abs: 1 10*3/uL (ref 0.7–4.0)
MCH: 30.2 pg (ref 26.0–34.0)
MCHC: 33.7 g/dL (ref 30.0–36.0)
MCV: 90.8 fL (ref 78.0–100.0)
Monocytes Absolute: 0.6 10*3/uL (ref 0.1–1.0)
Monocytes Relative: 6 % (ref 3–12)
Neutrophils Relative %: 82 % — ABNORMAL HIGH (ref 43–77)
Platelets: 186 10*3/uL (ref 150–400)
RBC: 4.04 MIL/uL (ref 3.87–5.11)
RBC: 3.61 MIL/uL — ABNORMAL LOW (ref 3.87–5.11)
RDW: 12.7 % (ref 11.5–15.5)
WBC: 8.9 10*3/uL (ref 4.0–10.5)

## 2013-01-30 LAB — PROTIME-INR
INR: 1.1 (ref 0.00–1.49)
Prothrombin Time: 14.1 seconds (ref 11.6–15.2)

## 2013-01-30 LAB — URINALYSIS, ROUTINE W REFLEX MICROSCOPIC
Bilirubin Urine: NEGATIVE
Glucose, UA: NEGATIVE mg/dL
Hgb urine dipstick: NEGATIVE
Hgb urine dipstick: NEGATIVE
Protein, ur: NEGATIVE mg/dL
Specific Gravity, Urine: 1.021 (ref 1.005–1.030)
Urobilinogen, UA: 0.2 mg/dL (ref 0.0–1.0)
Urobilinogen, UA: 0.2 mg/dL (ref 0.0–1.0)

## 2013-01-30 LAB — URINE MICROSCOPIC-ADD ON

## 2013-01-30 LAB — ABO/RH: ABO/RH(D): A POS

## 2013-01-30 LAB — COMPREHENSIVE METABOLIC PANEL
ALT: 16 U/L (ref 0–35)
ALT: 14 U/L (ref 0–35)
AST: 27 U/L (ref 0–37)
AST: 22 U/L (ref 0–37)
Albumin: 3.3 g/dL — ABNORMAL LOW (ref 3.5–5.2)
CO2: 23 mEq/L (ref 19–32)
Calcium: 9.4 mg/dL (ref 8.4–10.5)
Calcium: 7.2 mg/dL — ABNORMAL LOW (ref 8.4–10.5)
GFR calc non Af Amer: 37 mL/min — ABNORMAL LOW (ref >90)
Sodium: 138 mEq/L (ref 135–145)
Sodium: 140 mEq/L (ref 135–145)
Total Protein: 8.1 g/dL (ref 6.0–8.3)
Total Protein: 6.4 g/dL (ref 6.0–8.3)

## 2013-01-30 LAB — ETHANOL: Alcohol, Ethyl (B): <11 mg/dL (ref 0–11)

## 2013-01-30 LAB — TSH: TSH: 1.424 u[IU]/mL (ref 0.350–4.500)

## 2013-01-30 LAB — POCT ACTIVATED CLOTTING TIME: Activated Clotting Time: 138 seconds

## 2013-01-30 LAB — TYPE AND SCREEN: ABO/RH(D): A POS

## 2013-01-30 SURGERY — LEFT HEART CATHETERIZATION WITH CORONARY ANGIOGRAM
Anesthesia: LOCAL

## 2013-01-30 MED ORDER — VENLAFAXINE HCL ER 150 MG PO CP24
150.0000 mg | ORAL_CAPSULE | Freq: Once | ORAL | Status: AC
  Administered 2013-01-30: 150 mg via ORAL
  Filled 2013-01-30: qty 1

## 2013-01-30 MED ORDER — ASPIRIN 81 MG PO CHEW
CHEWABLE_TABLET | ORAL | Status: AC
  Filled 2013-01-30: qty 4

## 2013-01-30 MED ORDER — NOREPINEPHRINE BITARTRATE 1 MG/ML IJ SOLN
INTRAMUSCULAR | Status: AC
  Filled 2013-01-30: qty 4

## 2013-01-30 MED ORDER — ONDANSETRON HCL 4 MG/2ML IJ SOLN
INTRAMUSCULAR | Status: AC
  Filled 2013-01-30: qty 2

## 2013-01-30 MED ORDER — GLUCAGON HCL (RDNA) 1 MG IJ SOLR
INTRAMUSCULAR | Status: AC
  Filled 2013-01-30: qty 1

## 2013-01-30 MED ORDER — FAMOTIDINE 20 MG PO TABS
20.0000 mg | ORAL_TABLET | Freq: Two times a day (BID) | ORAL | Status: DC
  Administered 2013-01-30 – 2013-01-31 (×3): 20 mg via ORAL
  Filled 2013-01-30 (×5): qty 1

## 2013-01-30 MED ORDER — METOPROLOL SUCCINATE ER 25 MG PO TB24
25.0000 mg | ORAL_TABLET | Freq: Two times a day (BID) | ORAL | Status: DC
  Administered 2013-01-30 – 2013-01-31 (×2): 25 mg via ORAL
  Filled 2013-01-30 (×4): qty 1

## 2013-01-30 MED ORDER — HEPARIN SODIUM (PORCINE) 5000 UNIT/ML IJ SOLN
4000.0000 [IU] | Freq: Once | INTRAMUSCULAR | Status: AC
  Administered 2013-01-30: 4000 [IU] via INTRAVENOUS

## 2013-01-30 MED ORDER — ATROPINE SULFATE 1 MG/ML IJ SOLN
INTRAMUSCULAR | Status: AC
  Filled 2013-01-30: qty 1

## 2013-01-30 MED ORDER — ONDANSETRON HCL 4 MG/2ML IJ SOLN
4.0000 mg | Freq: Four times a day (QID) | INTRAMUSCULAR | Status: DC | PRN
  Administered 2013-01-30 – 2013-01-31 (×2): 4 mg via INTRAVENOUS
  Filled 2013-01-30 (×2): qty 2

## 2013-01-30 MED ORDER — NITROGLYCERIN 0.4 MG SL SUBL: 0.4000 mg | SUBLINGUAL_TABLET | SUBLINGUAL | Status: DC | PRN

## 2013-01-30 MED ORDER — SODIUM CHLORIDE 0.9 % IV SOLN: INTRAVENOUS | Status: AC

## 2013-01-30 MED ORDER — NITROGLYCERIN 1 MG/10 ML FOR IR/CATH LAB
INTRA_ARTERIAL | Status: AC
  Filled 2013-01-30: qty 10

## 2013-01-30 MED ORDER — ATORVASTATIN CALCIUM 80 MG PO TABS
80.0000 mg | ORAL_TABLET | Freq: Every day | ORAL | Status: DC
  Filled 2013-01-30 (×2): qty 1

## 2013-01-30 MED ORDER — ALPRAZOLAM 0.25 MG PO TABS
0.2500 mg | ORAL_TABLET | Freq: Two times a day (BID) | ORAL | Status: DC | PRN
  Administered 2013-01-30 – 2013-01-31 (×2): 0.25 mg via ORAL
  Filled 2013-01-30 (×2): qty 1

## 2013-01-30 MED ORDER — CLOPIDOGREL BISULFATE 75 MG PO TABS
75.0000 mg | ORAL_TABLET | Freq: Every day | ORAL | Status: DC
  Administered 2013-01-30 – 2013-01-31 (×2): 75 mg via ORAL
  Filled 2013-01-30 (×2): qty 1

## 2013-01-30 MED ORDER — ASPIRIN EC 81 MG PO TBEC
81.0000 mg | DELAYED_RELEASE_TABLET | Freq: Every day | ORAL | Status: DC
  Administered 2013-01-31: 81 mg via ORAL
  Filled 2013-01-30: qty 1

## 2013-01-30 MED ORDER — ASPIRIN 81 MG PO CHEW
324.0000 mg | CHEWABLE_TABLET | ORAL | Status: DC
  Filled 2013-01-30: qty 4

## 2013-01-30 MED ORDER — ASPIRIN 81 MG PO CHEW
324.0000 mg | CHEWABLE_TABLET | Freq: Once | ORAL | Status: AC
  Administered 2013-01-30: 324 mg via ORAL

## 2013-01-30 MED ORDER — LEVOFLOXACIN IN D5W 750 MG/150ML IV SOLN
750.0000 mg | INTRAVENOUS | Status: DC
  Administered 2013-01-30: 750 mg via INTRAVENOUS
  Filled 2013-01-30 (×2): qty 150

## 2013-01-30 MED ORDER — ONDANSETRON HCL 4 MG/2ML IJ SOLN
INTRAMUSCULAR | Status: AC
  Administered 2013-01-30: 08:00:00
  Filled 2013-01-30: qty 2

## 2013-01-30 MED ORDER — SODIUM CHLORIDE 0.9 % IV SOLN: INTRAVENOUS | Status: DC

## 2013-01-30 MED ORDER — DOPAMINE-DEXTROSE 3.2-5 MG/ML-% IV SOLN
INTRAVENOUS | Status: AC
  Filled 2013-01-30: qty 250

## 2013-01-30 MED ORDER — ASPIRIN 81 MG PO CHEW: 324.0000 mg | CHEWABLE_TABLET | ORAL | Status: DC

## 2013-01-30 MED ORDER — ZOLPIDEM TARTRATE 5 MG PO TABS
5.0000 mg | ORAL_TABLET | Freq: Every evening | ORAL | Status: DC | PRN
  Administered 2013-01-31: 5 mg via ORAL
  Filled 2013-01-30: qty 1

## 2013-01-30 MED ORDER — SODIUM CHLORIDE 0.9 % IJ SOLN
3.0000 mL | Freq: Two times a day (BID) | INTRAMUSCULAR | Status: DC
  Administered 2013-01-30 (×2): 3 mL via INTRAVENOUS

## 2013-01-30 MED ORDER — POTASSIUM CHLORIDE IN NACL 20-0.9 MEQ/L-% IV SOLN
INTRAVENOUS | Status: AC
  Administered 2013-01-30: 09:00:00 via INTRAVENOUS
  Filled 2013-01-30: qty 1000

## 2013-01-30 MED ORDER — SODIUM CHLORIDE 0.9 % IJ SOLN: 3.0000 mL | INTRAMUSCULAR | Status: DC | PRN

## 2013-01-30 MED ORDER — ASPIRIN 300 MG RE SUPP: 300.0000 mg | RECTAL | Status: DC

## 2013-01-30 MED ORDER — HEPARIN (PORCINE) IN NACL 2-0.9 UNIT/ML-% IJ SOLN
INTRAMUSCULAR | Status: AC
  Filled 2013-01-30: qty 4000

## 2013-01-30 MED ORDER — VENLAFAXINE HCL ER 150 MG PO CP24
150.0000 mg | ORAL_CAPSULE | Freq: Every day | ORAL | Status: DC
  Administered 2013-01-31: 150 mg via ORAL
  Filled 2013-01-30 (×2): qty 1

## 2013-01-30 MED ORDER — ACETAMINOPHEN 325 MG PO TABS
650.0000 mg | ORAL_TABLET | ORAL | Status: DC | PRN
  Administered 2013-01-30: 650 mg via ORAL
  Filled 2013-01-30: qty 2

## 2013-01-30 MED ORDER — PROMETHAZINE HCL 12.5 MG PO TABS
12.5000 mg | ORAL_TABLET | Freq: Three times a day (TID) | ORAL | Status: DC | PRN
  Administered 2013-01-30 – 2013-01-31 (×2): 12.5 mg via ORAL
  Filled 2013-01-30 (×2): qty 1

## 2013-01-30 MED ORDER — ACETAMINOPHEN 325 MG PO TABS: 650.0000 mg | ORAL_TABLET | ORAL | Status: DC | PRN

## 2013-01-30 MED ORDER — SODIUM CHLORIDE 0.9 % IV SOLN: 250.0000 mL | INTRAVENOUS | Status: DC | PRN

## 2013-01-30 NOTE — ED Provider Notes (Addendum)
History     CSN: [id number]  Arrival date & time 01/30/13  0418   First MD Initiated Contact with Patient 01/30/13 0559      No chief complaint on file.   (Consider location/radiation/quality/duration/timing/severity/associated sxs/prior treatment) Patient is a 67 y.o. female presenting with syncope. The history is provided by the EMS personnel. The history is limited by the condition of the patient (urgent need for intervention). No language interpreter was used.  Loss of Consciousness  This is a new problem. The current episode started 3 to 5 hours ago. The problem occurs constantly. The problem has been resolved (syncope x 2 at home witnessed). She lost consciousness for a period of 1 to 5 minutes. The problem is associated with normal activity. Associated symptoms include diaphoresis, nausea, slurred speech and vomiting. Pertinent negatives include fever. She has tried nothing for the symptoms. The treatment provided mild relief. Her past medical history is significant for HTN.      Past Surgical History  Procedure Laterality Date  . Appendectomy  1960  . Total abdominal hysterectomy  1974  . Tendon release  1996    Right forearm and hand  . Knee surgery  2005  . Heel spur surgery  2008    left foot  . Plantar fascia release  2008    left  . Axillary surgery  1978    Multiple "lump" in armpit per pt  . Coccyx removal  1972    Family History  Problem Relation Age of Onset  . Arthritis Mother   . Kidney disease Mother   . Heart disease Father   . Stroke Father   . Hypertension Father   . Diabetes Father     History  Substance Use Topics  . Smoking status: Never Smoker   . Smokeless tobacco: Never Used  . Alcohol Use: No    OB History   Grav Para Term Preterm Abortions TAB SAB Ect Mult Living                  Review of Systems  Unable to perform ROS Constitutional: Positive for diaphoresis. Negative for fever.  Cardiovascular: Positive for syncope.    Gastrointestinal: Positive for nausea and vomiting.  Neurological: Negative for numbness.    Allergies  Penicillins  Home Medications   Current Outpatient Rx  Name  Route  Sig  Dispense  Refill  . ALPRAZolam (XANAX) 1 MG tablet   Oral   Take 1 tablet (1 mg total) by mouth 4 (four) times daily.   120 tablet   5   . amitriptyline (ELAVIL) 100 MG tablet   Oral   Take 1 tablet (100 mg total) by mouth at bedtime.   90 tablet   3   . aspirin EC 81 MG tablet   Oral   Take 81 mg by mouth daily.         . budesonide-formoterol (SYMBICORT) 160-4.5 MCG/ACT inhaler   Inhalation   Inhale 2 puffs into the lungs 2 (two) times daily.         . butalbital-acetaminophen-caffeine (FIORICET, ESGIC) 50-325-40 MG per tablet   Oral   Take 1 tablet by mouth every 6 (six) hours as needed.   90 tablet   1   . cyclobenzaprine (FLEXERIL) 10 MG tablet   Oral   Take 10 mg by mouth 4 (four) times daily as needed.         . desvenlafaxine (PRISTIQ) 100 MG 24 hr tablet  Oral   Take 1 tablet (100 mg total) by mouth daily.   90 tablet   1   . estrogens, conjugated, (PREMARIN) 0.9 MG tablet   Oral   Take 0.9 mg by mouth daily.         . hydrochlorothiazide (HYDRODIURIL) 25 MG tablet   Oral   Take 1 tablet (25 mg total) by mouth daily.   90 tablet   3   . HYDROcodone-acetaminophen (LORTAB) 10-500 MG per tablet   Oral   Take 1 tablet by mouth every 6 (six) hours as needed.         [name] Kitchen lisinopril (PRINIVIL,ZESTRIL) 40 MG tablet   Oral   Take 1 tablet (40 mg total) by mouth daily.   90 tablet   3   . meloxicam (MOBIC) 7.5 MG tablet   Oral   Take 7.5 mg by mouth 2 (two) times daily.         . metoprolol succinate (TOPROL-XL) 100 MG 24 hr tablet   Oral   Take 1 tablet (100 mg total) by mouth daily. Take with or immediately following a meal.   90 tablet   3   . metoprolol succinate (TOPROL-XL) 50 MG 24 hr tablet      Take with 100mg  Toprol XL for total dose of 150mg   once daily dosing   90 tablet   3   . pregabalin (LYRICA) 300 MG capsule   Oral   Take 300 mg by mouth 2 (two) times daily.         [name] Kitchen zolpidem (AMBIEN) 10 MG tablet   Oral   Take 1 tablet (10 mg total) by mouth at bedtime as needed.   30 tablet   5   . Zolpidem Tartrate (INTERMEZZO) 3.5 MG SUBL   Sublingual   Place 1 tablet under the tongue as needed.           There were no vitals taken for this visit.  Physical Exam  Constitutional: She appears well-developed and well-nourished.  HENT:  Head:    Right Ear: No hemotympanum.  Left Ear: No hemotympanum.  Mouth/Throat: Oropharynx is clear and moist.  Eyes: Conjunctivae are normal. Pupils are equal, round, and reactive to light.  Neck: No tracheal deviation present.  In c collar  Cardiovascular: Normal rate and regular rhythm.   No murmur heard. Pulmonary/Chest: Effort normal and breath sounds normal. No respiratory distress. She has no wheezes. She has no rales.  Abdominal: Soft. Bowel sounds are normal. There is no tenderness. There is no rebound and no guarding.  Musculoskeletal: Normal range of motion.  Neurological: She is alert. She has normal reflexes.  Skin: Skin is warm and dry. She is not diaphoretic.  Psychiatric:  amnesia    ED Course  Procedures (including critical care time)  Labs Reviewed  PHENYTOIN LEVEL, TOTAL - Abnormal; Notable for the following:    Phenytoin Lvl <2.5 (*)    All other components within normal limits  COMPREHENSIVE METABOLIC PANEL - Abnormal; Notable for the following:    Potassium 3.3 (*)    Glucose, Bld 193 (*)    BUN 30 (*)    Creatinine, Ser 1.43 (*)    GFR calc non Af Amer 37 (*)    GFR calc Af Amer 43 (*)    All other components within normal limits  LACTIC ACID, PLASMA - Abnormal; Notable for the following:    Lactic Acid, Venous 2.3 (*)    All other components  within normal limits  ETHANOL  SALICYLATE LEVEL  TROPONIN I  CBC WITH DIFFERENTIAL  PROTIME-INR    APTT  URINALYSIS, ROUTINE W REFLEX MICROSCOPIC  URINE RAPID DRUG SCREEN (HOSP PERFORMED)  TYPE AND SCREEN  ABO/RH   No results found.   1. ACS (acute coronary syndrome)   2. Hypotension   3. Syncope   4. Laceration       MDM  MDM Reviewed: nursing note and vitals Interpretation: ECG, x-ray and CT scan Total time providing critical care: 75-105 minutes. This excludes time spent performing separately reportable procedures and services. Consults: cardiology and pulmonary   Family declined central line with nurse Christa present  CRITICAL CARE Performed by: Jasmine Awe   Total critical care time: 90 minutes  Critical care time was exclusive of separately billable procedures and treating other patients.  Critical care was necessary to treat or prevent imminent or life-threatening deterioration.  Critical care was time spent personally by me on the following activities: development of treatment plan with patient and/or surrogate as well as nursing, discussions with consultants, evaluation of patient's response to treatment, examination of patient, obtaining history from patient or surrogate, ordering and performing treatments and interventions, ordering and review of laboratory studies, ordering and review of radiographic studies, pulse oximetry and re-evaluation of patient's condition.     Date: 01/30/2013  Rate: 75  Rhythm: normal  QRS Axis: normal  Intervals: normal  ST/T Wave abnormalities: nonspecific ST changes  Conduction Disutrbances:none  Narrative Interpretation:   Old EKG Reviewed: none available    Date: 01/30/2013  Rate: 77  Rhythm: normal sinus rhythm  QRS Axis: normal  Intervals: QT prolonged  ST/T Wave abnormalities: nonspecific ST changes  Conduction Disutrbances:right bundle branch block  Narrative Interpretation:   Old EKG Reviewed: changes noted    Date: 01/30/2013  Rate: 79  Rhythm: normal sinus rhythm  QRS Axis: normal   Intervals: normal  ST/T Wave abnormalities: ST elevations laterally and ST depressions inferiorly  Conduction Disutrbances:none  Narrative Interpretation:   Old EKG Reviewed: changes noted     Ulys Favia K Omya Winfield-Rasch, MD 01/30/13 [phone]  LACERATION REPAIR Performed by: Jasmine Awe Authorized by: Jasmine Awe Consent: Verbal consent obtained. Risks and benefits: risks, benefits and alternatives were discussed Consent given by: patient Patient identity confirmed: provided demographic data Prepped and Draped in normal sterile fashion Wound explored  Laceration Location: scalp  Laceration Length: 1.5 cm  No Foreign Bodies seen or palpated  Irrigation method: syringe Amount of cleaning: standard  Skin closure: staples  Number of sutures: 3  Technique: 3  Patient tolerance: Patient tolerated the procedure well with no immediate complications.   Jasmine Awe, MD 01/30/13 [phone]

## 2013-01-30 NOTE — Progress Notes (Signed)
Pt came in code stemi; I met pt's son and daughter, Linda [name] and 86.  Told me this day had been the worst of their lives as they had just received news that their father (pt's spouse) had terminal cancer, 3 - 6 mos to live.  We shared prayer for entire family, and I kept them posted on going to CT, back from CT, etc.  Accompanied dr. To speak w/ family.  CT showed nothing in brain; BP very low, md asked if pt may have taken her husband's pills, and answer was likely.  MD requested central line be put in, but family declined.  Cardiologist Dr. Sharyn Lull also seeing pt and they were waiting for the cath lab.  I accompanied family to pt bedside.

## 2013-01-30 NOTE — CV Procedure (Signed)
The cath report dictated on 01/30/2013 dictation number is [id number]

## 2013-01-30 NOTE — Progress Notes (Signed)
  Echocardiogram 2D Echocardiogram has been performed.  Fidela Cieslak FRANCES 01/30/2013, 5:23 PM

## 2013-01-30 NOTE — Plan of Care (Signed)
Problem: Phase I Progression Outcomes Goal: Hemodynamically stable Outcome: Progressing Dopamine gtt off Goal: Anginal pain relieved Outcome: Progressing Denies chest pain but nausea persists

## 2013-01-30 NOTE — Progress Notes (Signed)
Rt groin arterial sheath d/c'd per protocol (ACT = 138) the patient tol well .Manual pressure held x 30 min ,then pressure drsg applied . the patient verbalized understanding re bedrest and keeping torso,hips and RLE on bed and still. the patient vented feelings re spouse's serious illness ( during the time manual pressure was held to rt groin site). Dopamine gtt slowly titrated ( to wean off).

## 2013-01-30 NOTE — H&P (Cosign Needed)
Linda [name] is an 67 y.o. female.   Chief Complaint: Chest pain/hypertension status post syncope HPI: Patient is 67 year old female with past medical history significant for hypertension, depression, anxiety disorder, degenerative joint disease, history of asbestosis, fibromyalgia, obstructive sleep apnea, chronic anemia, came to the ER by EMS following syncopal episode at home patient was noted to be hypotensive IV fluid bolus EKG done on the field showed normal sinus rhythm with right bundle branch block. Patient denied any chest pain nausea but and one episode of vomiting associated with diaphoresis per EMS. In ER patient complained of severe headache and neck pain but no chest pain but remained hypotensive patient had an emergency CT of the brain and C-spine which was negative. While in ER patient developed retrosternal chest heaviness repeat EKG showed normal sinus rhythm with right bundle branch block with minimal further ST depression in inferolateral leads patient remained hypotensive despite receiving 2 L of IV fluids and was started on dopamine. Patient initially was being admitted to [facility] but due to the chest pain and EKG changes I was called to evaluate the patient.    Past Surgical History  Procedure Laterality Date  . Appendectomy  1960  . Total abdominal hysterectomy  1974  . Tendon release  1996    Right forearm and hand  . Knee surgery  2005  . Heel spur surgery  2008    left foot  . Plantar fascia release  2008    left  . Axillary surgery  1978    Multiple "lump" in armpit per pt  . Coccyx removal  1972    Family History  Problem Relation Age of Onset  . Arthritis Mother   . Kidney disease Mother   . Heart disease Father   . Stroke Father   . Hypertension Father   . Diabetes Father    Social History:  reports that she has never smoked. She has never used smokeless tobacco. She reports that she does not drink alcohol or use illicit drugs.  Allergies:   Allergies  Allergen Reactions  . Penicillins Itching, Swelling and Rash     (Not in a hospital admission)  Results for orders placed during the hospital encounter of 01/30/13 (from the past 48 hour(s))  TYPE AND SCREEN     Status: None   Collection Time    01/30/13  4:40 AM      Result Value Range   ABO/RH(D) A POS     Antibody Screen NEG     Sample Expiration 02/02/2013    COMPREHENSIVE METABOLIC PANEL     Status: Abnormal   Collection Time    01/30/13  4:45 AM      Result Value Range   Sodium 138  135 - 145 mEq/L   Potassium 3.3 (*) 3.5 - 5.1 mEq/L   Chloride 98  96 - 112 mEq/L   CO2 23  19 - 32 mEq/L   Glucose, Bld 193 (*) 70 - 99 mg/dL   BUN 30 (*) 6 - 23 mg/dL   Creatinine, Ser 1.61 (*) 0.50 - 1.10 mg/dL   Calcium 9.4  8.4 - 09.6 mg/dL   Total Protein 8.1  6.0 - 8.3 g/dL   Albumin 4.1  3.5 - 5.2 g/dL   AST 27  0 - 37 U/L   ALT 16  0 - 35 U/L   Alkaline Phosphatase 100  39 - 117 U/L   Total Bilirubin 0.3  0.3 - 1.2 mg/dL   GFR  calc non Af Amer 37 (*) >90 mL/min   GFR calc Af Amer 43 (*) >90 mL/min   Comment:            The eGFR has been calculated     using the CKD EPI equation.     This calculation has not been     validated in all clinical     situations.     eGFR's persistently     <90 mL/min signify     possible Chronic Kidney Disease.  TROPONIN I     Status: None   Collection Time    01/30/13  4:45 AM      Result Value Range   Troponin I <0.30  <0.30 ng/mL   Comment:            Due to the release kinetics of cTnI,     a negative result within the first hours     of the onset of symptoms does not rule out     myocardial infarction with certainty.     If myocardial infarction is still suspected,     repeat the test at appropriate intervals.  LACTIC ACID, PLASMA     Status: Abnormal   Collection Time    01/30/13  4:45 AM      Result Value Range   Lactic Acid, Venous 2.3 (*) 0.5 - 2.2 mmol/L  CBC WITH DIFFERENTIAL     Status: None   Collection  Time    01/30/13  4:45 AM      Result Value Range   WBC 8.9  4.0 - 10.5 K/uL   RBC 4.04  3.87 - 5.11 MIL/uL   Hemoglobin 12.5  12.0 - 15.0 g/dL   HCT 91.4  78.2 - 95.6 %   MCV 90.8  78.0 - 100.0 fL   MCH 30.9  26.0 - 34.0 pg   MCHC 34.1  30.0 - 36.0 g/dL   RDW 21.3  08.6 - 57.8 %   Platelets 231  150 - 400 K/uL   Neutrophils Relative 62  43 - 77 %   Neutro Abs 5.5  1.7 - 7.7 K/uL   Lymphocytes Relative 30  12 - 46 %   Lymphs Abs 2.7  0.7 - 4.0 K/uL   Monocytes Relative 6  3 - 12 %   Monocytes Absolute 0.6  0.1 - 1.0 K/uL   Eosinophils Relative 2  0 - 5 %   Eosinophils Absolute 0.1  0.0 - 0.7 K/uL   Basophils Relative 0  0 - 1 %   Basophils Absolute 0.0  0.0 - 0.1 K/uL  PROTIME-INR     Status: None   Collection Time    01/30/13  4:45 AM      Result Value Range   Prothrombin Time 11.9  11.6 - 15.2 seconds   INR 0.88  0.00 - 1.49  APTT     Status: None   Collection Time    01/30/13  4:45 AM      Result Value Range   aPTT 24  24 - 37 seconds  ETHANOL     Status: None   Collection Time    01/30/13  5:07 AM      Result Value Range   Alcohol, Ethyl (B) <11  0 - 11 mg/dL   Comment:            LOWEST DETECTABLE LIMIT FOR     SERUM ALCOHOL IS 11 mg/dL     FOR  MEDICAL PURPOSES ONLY  PHENYTOIN LEVEL, TOTAL     Status: Abnormal   Collection Time    01/30/13  5:07 AM      Result Value Range   Phenytoin Lvl <2.5 (*) 10.0 - 20.0 ug/mL  SALICYLATE LEVEL     Status: None   Collection Time    01/30/13  5:07 AM      Result Value Range   Salicylate Lvl 2.8  2.8 - 20.0 mg/dL   No results found.  Review of Systems  Constitutional: Negative for fever and chills.  HENT: Positive for neck pain.   Respiratory: Negative for cough, hemoptysis, sputum production and shortness of breath.   Cardiovascular: Positive for chest pain. Negative for palpitations and orthopnea.  Gastrointestinal: Positive for vomiting. Negative for abdominal pain and diarrhea.  Genitourinary: Negative for  dysuria.  Neurological: Positive for dizziness and headaches.    There were no vitals taken for this visit. Physical Exam  HENT:  Head: Normocephalic and atraumatic.  Eyes: Conjunctivae are normal. Left eye exhibits no discharge. No scleral icterus.  Neck:  Cervical collar noted  Cardiovascular: Normal rate and regular rhythm.   Murmur (Soft systolic murmur noted) heard. Respiratory: Effort normal and breath sounds normal. She has no wheezes. She has no rales.  GI: Soft. Bowel sounds are normal. She exhibits no distension. There is no tenderness. There is no rebound.  Musculoskeletal: She exhibits no edema and no tenderness.  Neurological: She is alert. She has normal reflexes.     Assessment/Plan Chest pain/status post syncope hypotension rule out MI rule out cardiac arrhythmias Hypotensive shock probably secondary to meds Obstructive sleep apnea History of asbestosis Degenerative joint disease Fibromyalgia Osteoarthritis Chronic anemia History of vertigo Plan Discussed with patient regarding emergency left cath possible PTCA stenting its risk and benefits i.e. death MI stroke need for emergency CABG risk of restenosis local last complications etc. and consents for PCI and  Mamye Bolds N 01/30/2013, 5:53 AM

## 2013-01-30 NOTE — Cardiovascular Report (Signed)
[name], [name]            ACCOUNT NO.:  [id number]  MEDICAL RECORD NO.:  [id number]  LOCATION:  2902                         FACILITY:  [facility]  PHYSICIAN:  Eduardo Osier. Sharyn Lull, M.D. DATE OF BIRTH:  [birth year]  DATE OF PROCEDURE:  01/30/2013 DATE OF DISCHARGE:                           CARDIAC CATHETERIZATION   PROCEDURE:  Left cardiac cath with selective left and right coronary angiography, left ventriculography via right groin using Judkins technique.  INDICATION FOR THE PROCEDURE:  [name] is a 67 year old female with past medical history significant for hypertension, depression, anxiety disorder, degenerative joint disease, history of asbestosis, fibromyalgia, obstructive sleep apnea, and chronic anemia.  She came to the ER by EMS, following syncopal episode at home.  The patient was noted to be hypotensive, received IV fluid bolus.  EKG done on the field showed normal sinus rhythm with right bundle-branch block pattern with secondary ST-T wave changes.  The patient denies any chest pain, nausea, but had 1 episode of vomiting associated with diaphoresis per EMS.  In the ER, the patient complained of severe headache, neck pain, but no chest pain and remained hypotensive.  The patient had emergency CT of the brain and C-spine which were negative.  While in the ER, the patient developed retrosternal chest pain described as heaviness.  Repeat EKG showed normal sinus rhythm with right bundle-branch block with minimal further ST-depression in inferolateral leads.  The patient remained hypotensive despite receiving 2 L of IV fluid and was started on dopamine.  Due to chest pain further ST-T wave changes, and hypotension discussed with the patient and family at length regarding emergency left cath, possible PTCA stenting, its risks and benefits, i.e., death, MI, stroke, need for emergency CABG, local vascular complications, etc., and consented for PCI.  PROCEDURE:  After  obtaining the informed consent, the patient was brought to the cath lab and was placed on fluoroscopy table.  Right groin was prepped and draped in usual fashion.  Xylocaine 1% was used for local anesthesia in the right groin.  With the help of thin wall needle, 6-French arterial sheath was placed.  The sheath was aspirated and flushed.  Next, 6-French left Judkins catheter was advanced over the wire under fluoroscopic guidance up to the ascending aorta.  Wire was pulled out.  The catheter was aspirated and connected to the Manifold. Catheter was further advanced and engaged into left coronary ostium. Multiple views of the left system were taken.  Next, catheter was disengaged and was pulled out over the wire and was replaced with 6- [place] right Judkins catheter, which was advanced over the wire under fluoroscopic guidance up to the ascending aorta.  Wire was pulled out. The catheter was aspirated and connected to the Manifold.  Catheter was further advanced and engaged into right coronary ostium.  Multiple views of the right system were taken.  Next, catheter was disengaged and was pulled out over the wire and was replaced with 6-French pigtail catheter, which was advanced over the wire under fluoroscopic guidance up to the ascending aorta.  Wire was pulled out.  The catheter was aspirated and connected to the Manifold.  Catheter was further advanced across the aortic valve into the LV.  LV pressures were recorded.  Next, LV graft was done in 30-degree RAO position.  Post-angiographic pressures were recorded from LV and then pullback pressures were recorded from aorta. There was no gradient across the aortic valve. Next, pigtail catheter was pulled out over the wire.  Sheaths were aspirated and flushed.  FINDINGS:  LV showed good LV systolic function, EF of 60-65%.  There was 4+ MR.  Left main was patent.  LAD has 15-20% mid stenosis.  Diagonal 1 was very small which was patent.   Diagonal 2 has 20-30% ostial stenosis. Diagonal 3 was small which was patent.  Left circumflex was patent.  OM1 had 40-50% proximal stenosis. OM2 has 15-20% stenosis.  OM3 was patent.  RCA has 50-60% mid focal stenosis.  PDA and PLV branches were patent.  The patient tolerated the procedure well.  There were no complications.  The patient was transferred to CCU in stable condition.     Eduardo Osier. Sharyn Lull, M.D.     M[facility]/MEDQ  D:  01/30/2013  T:  01/30/2013  Job:  [id number]

## 2013-01-31 ENCOUNTER — Encounter: Payer: Self-pay | Admitting: Family Medicine

## 2013-01-31 LAB — CBC
HCT: 30.7 % — ABNORMAL LOW (ref 36.0–46.0)
MCV: 90 fL (ref 78.0–100.0)
Platelets: 189 10*3/uL (ref 150–400)
RBC: 3.41 MIL/uL — ABNORMAL LOW (ref 3.87–5.11)
RDW: 13.2 % (ref 11.5–15.5)
WBC: 12.2 10*3/uL — ABNORMAL HIGH (ref 4.0–10.5)

## 2013-01-31 LAB — BASIC METABOLIC PANEL
CO2: 20 mEq/L (ref 19–32)
Chloride: 107 mEq/L (ref 96–112)
Creatinine, Ser: 0.74 mg/dL (ref 0.50–1.10)
GFR calc Af Amer: >90 mL/min (ref >90)
Potassium: 3 mEq/L — ABNORMAL LOW (ref 3.5–5.1)

## 2013-01-31 LAB — HEMOGLOBIN A1C
Hgb A1c MFr Bld: 5.7 % — ABNORMAL HIGH (ref <5.7)
Hgb A1c MFr Bld: 5.7 % — ABNORMAL HIGH (ref <5.7)
Mean Plasma Glucose: 117 mg/dL — ABNORMAL HIGH (ref <117)

## 2013-01-31 LAB — GLUCOSE, CAPILLARY
Glucose-Capillary: 133 mg/dL — ABNORMAL HIGH (ref 70–99)
Glucose-Capillary: 129 mg/dL — ABNORMAL HIGH (ref 70–99)

## 2013-01-31 LAB — LIPID PANEL
LDL Cholesterol: 90 mg/dL (ref 0–99)
Triglycerides: 92 mg/dL (ref <150)

## 2013-01-31 MED ORDER — FAMOTIDINE 20 MG PO TABS: 20.0000 mg | ORAL_TABLET | Freq: Two times a day (BID) | ORAL | Status: DC

## 2013-01-31 MED ORDER — ATORVASTATIN CALCIUM 20 MG PO TABS: 20.0000 mg | ORAL_TABLET | Freq: Every day | ORAL | Status: DC

## 2013-01-31 MED ORDER — POTASSIUM CHLORIDE CRYS ER 20 MEQ PO TBCR
40.0000 meq | EXTENDED_RELEASE_TABLET | Freq: Once | ORAL | Status: AC
  Administered 2013-01-31: 40 meq via ORAL
  Filled 2013-01-31: qty 1
  Filled 2013-01-31: qty 2

## 2013-01-31 MED ORDER — LEVOFLOXACIN 750 MG PO TABS: 750.0000 mg | ORAL_TABLET | Freq: Every day | ORAL | Status: DC

## 2013-01-31 MED ORDER — ZOLPIDEM TARTRATE 5 MG PO TABS: 5.0000 mg | ORAL_TABLET | Freq: Every evening | ORAL | Status: DC | PRN

## 2013-01-31 MED ORDER — NITROGLYCERIN 0.4 MG SL SUBL: 0.4000 mg | SUBLINGUAL_TABLET | SUBLINGUAL | Status: DC | PRN

## 2013-01-31 MED ORDER — CLOPIDOGREL BISULFATE 75 MG PO TABS: 75.0000 mg | ORAL_TABLET | Freq: Every day | ORAL | Status: DC

## 2013-01-31 MED ORDER — ALPRAZOLAM 0.25 MG PO TABS: 0.2500 mg | ORAL_TABLET | Freq: Two times a day (BID) | ORAL | Status: DC | PRN

## 2013-01-31 MED ORDER — ASPIRIN 81 MG PO TBEC: 81.0000 mg | DELAYED_RELEASE_TABLET | Freq: Every day | ORAL | Status: DC

## 2013-01-31 NOTE — Progress Notes (Signed)
Utilization Review Completed.Linda [name] T2/08/2013  

## 2013-01-31 NOTE — Progress Notes (Signed)
D/c instructions reviewed w/Pt and family .Understanding verbalized by Pt and family .

## 2013-01-31 NOTE — Discharge Summary (Signed)
  Discharge in the dictated on 01/31/2013 dictation number is [id number]

## 2013-01-31 NOTE — Discharge Summary (Signed)
NAMEKRISTIANNA, [name]            ACCOUNT NO.:  [id number]  MEDICAL RECORD NO.:  [id number]  LOCATION:  2902                         FACILITY:  [facility]  PHYSICIAN:  Eduardo Osier. Sharyn Lull, M.D. DATE OF BIRTH:  September 07, 1946  DATE OF ADMISSION:  01/30/2013 DATE OF DISCHARGE:  01/31/2013                              DISCHARGE SUMMARY   ADMITTING DIAGNOSES: 1. Chest pain, status post syncope/hypotension rule out myocardial     infarction rule out cardiac arrhythmias. 2. Hypotensive shock probably due to medication. 3. Obstructive sleep apnea. 4. History of asbestosis. 5. Degenerative joint disease. 6. Fibromyalgia. 7. Osteoarthritis. 8. Chronic anemia. 9. History of vertigo.  DISCHARGE DIAGNOSES: 1. Status post chest pain, myocardial infarction ruled out, status     post left cardiac cath. 2. Moderate RCA stenosis. 3. Status post hypotensive shock. 4. Obstructive sleep apnea. 5. History of asbestosis. 6. Hypercholesteremia. 7. Degenerative joint disease. 8. Fibromyalgia. 9. Osteoarthritis. 10.Chronic anemia. 11.History of vertigo. 12.Status post hyperpyrexia rule out urinary tract infection. 13.Elevated blood sugar, rule out diabetes.  DISCHARGE HOME MEDICATIONS: 1. Enteric-coated aspirin 81 mg 1 tablet daily. 2. Atorvastatin 20 mg 1 tablet daily. 3. Clopidogrel 75 mg 1 tablet daily with breakfast. 4. Pepcid 20 mg 1 tablet twice daily. 5. Levofloxacin 750 mg 1 tablet daily for 7 days. 6. Nitrostat 0.4 mg sublingual use as directed. 7. Xanax dose has been reduced to 0.25 mg twice daily. 8. Ambien dose has been reduced to 5 mg 1 tablet daily as needed     q.h.s. 9. The patient has been advised to continue all albuterol inhaler as     before. 10.Symbicort inhaler twice daily 2 puffs as before. 11.Fioricet drug 1-2 tablets every 6 hours for headache as needed.     The patient will discuss with PMD to wean off Fioricet. 12.Centrum Silver 1 tablet daily. 13.Flexeril 10 mg 4 times  daily as needed. 14.Pristiq 100 mg daily as before. 15.Estrogen 0.5 mg tablet daily as before. 16.Metoprolol succinate 50 mg 1 tablet daily.  The patient has been advised to stop amitriptyline, hydrochlorothiazide, lisinopril, meloxicam, and Lyrica for now.  CONDITION AT DISCHARGE:  Stable.  Post cardiac cath instructions have been given.  Follow up with me in 1 week.  Follow up with PMD as scheduled.  CONDITION ON DISCHARGE:  Stable.  BRIEF HISTORY AND HOSPITAL COURSE:  Linda [name] is a 67 year old female with past medical history significant for hypertension, depression, anxiety disorder, degenerative joint disease, history of asbestosis, fibromyalgia, obstructive sleep apnea, chronic anemia, vertigo, she came to the ER by EMS following syncopal episode at home. The patient was noted to be hypotensive.  IV fluid bolus was given.  EKG done on the field showed normal sinus rhythm with right bundle-branch block with secondary ST-T wave changes.  Code STEMI was called.  The patient denies any chest pain, nausea, but had 1 episode of vomiting associated with diaphoresis per EMS.  In the ER, the patient complained of severe headache and neck pain, but denies any chest pain, remained hypotensive.  The patient underwent emergency CT of the brain and C- spine which were negative.  In the ER, the patient developed retrosternal chest heaviness.  Repeat EKG  showed normal sinus rhythm with right bundle-branch block with further minimal ST depression in inferolateral leads, and remained hypotensive despite receiving more than 2 L of IV fluids and was started on dopamine.  The patient initially was being admitted to [facility] but due to chest pain EKG changes, I was called to evaluate the patient.  PAST MEDICAL HISTORY:  As above.  PAST SURGICAL HISTORY:  She had appendectomy in the past, had total abdominal hysterectomy in the past, tendon release in the past, had knee surgery in the past, had  C-spine surgery in the past, had plantar fascial release in the past, had axillary surgery for multiple lumps in the armpit in the past, and had plastic surgery in the past.  PHYSICAL EXAMINATION:  GENERAL:  She was alert, awake. EYES:  Conjunctivae was pink. NECK:  She had C-collar in the ER were noted. LUNGS:  Clear to auscultation without rhonchi or rales. CARDIOVASCULAR:  S1, S2 was normal.  There was soft systolic murmur. There was no rub. ABDOMEN:  Soft.  Bowel sounds present.  Nontender. EXTREMITIES:  No clubbing, cyanosis, or edema.  LABORATORY DATA:  Sodium was 140, potassium 3.3, BUN 27, creatinine 0.84.  Repeat potassium is 3.0 which has been replaced.  BUN is 16, creatinine 0.74.  Her blood sugar was 133, fasting blood sugar this morning is 137.  Cholesterol 152, LDL is 90, HDL 44, triglycerides 92, hemoglobin was 10.9, hematocrit 32.3, white count of 9.7.  Repeat hemoglobin today is 10.4, hematocrit 30.7, white count of 12.2.  Three sets of troponin I were normal.  Urinalysis initially was negative. Repeat urinalysis showed moderate leukocytes and WBCs.  Blood cultures are still pending.  They are negative so far.  C. diff toxin is negative.  The patient had CT of the brain which showed no acute intracranial abnormalities.  CT of cervical spine showed degenerative changes.  No displaced fractures were appreciated.  EKG showed normal sinus rhythm with right bundle-branch block with secondary ST-T wave changes.  Chest x-ray showed no active lung disease, stable bilateral calcified pleural plaques were noted.  BRIEF HOSPITAL COURSE:  The patient was directly taken to the cath lab and underwent emergency left cardiac cath with selective left and right coronary angiography as per procedure report.  The patient tolerated the procedure well.  There were no complications.  Postprocedure, the patient did not had any episodes of chest pain during the hospital stay. All her blood  pressure medications were held.  Dopamine was slowly weaned off without any problems.  The patient's blood pressure remained stable.  The patient did spike temp of above 101 last night.  Pan cultures were sent, which has been negative.  The patient was started empirically on Levaquin.  The patient had few loose BM early this morning but has remained afebrile.  The patient's blood pressure has been stable.  Her groin is stable with no evidence of hematoma or bruit. The patient will be discharged home on above medications and will be followed up in my office in 1 week.  The patient will discuss with her PMD regarding restarting or reducing the dose of other medications as above.     Eduardo Osier. Sharyn Lull, M.D.     [facility]  D:  01/31/2013  T:  01/31/2013  Job:  [id number]

## 2013-02-01 LAB — BLOOD GAS, ARTERIAL

## 2013-02-01 LAB — POCT I-STAT, CHEM 8
Creatinine, Ser: 1.9 mg/dL — ABNORMAL HIGH (ref 0.50–1.10)
Glucose, Bld: 192 mg/dL — ABNORMAL HIGH (ref 70–99)
Hemoglobin: 13.3 g/dL (ref 12.0–15.0)
TCO2: 25 mmol/L (ref 0–100)

## 2013-02-01 LAB — POCT I-STAT 3, ART BLOOD GAS (G3+)
Acid-base deficit: 4 mmol/L — ABNORMAL HIGH (ref 0.0–2.0)
O2 Saturation: 84 %

## 2013-02-01 LAB — POCT I-STAT TROPONIN I: Troponin i, poc: 0 ng/mL (ref 0.00–0.08)

## 2013-02-01 LAB — POCT ACTIVATED CLOTTING TIME: Activated Clotting Time: 182 seconds

## 2013-02-06 LAB — CULTURE, BLOOD (ROUTINE X 2): Culture: NO GROWTH

## 2013-02-07 ENCOUNTER — Encounter: Payer: Self-pay | Admitting: Family Medicine

## 2013-02-08 ENCOUNTER — Ambulatory Visit (INDEPENDENT_AMBULATORY_CARE_PROVIDER_SITE_OTHER): Payer: Medicare Other | Admitting: Family Medicine

## 2013-02-08 ENCOUNTER — Encounter: Payer: Self-pay | Admitting: Family Medicine

## 2013-02-08 VITALS — BP 130/76 | HR 89 | Ht 64.0 in | Wt 154.0 lb

## 2013-02-08 DIAGNOSIS — R55 Syncope and collapse: Secondary | ICD-10-CM | POA: Diagnosis not present

## 2013-02-08 DIAGNOSIS — I959 Hypotension, unspecified: Secondary | ICD-10-CM

## 2013-02-08 DIAGNOSIS — S0100XA Unspecified open wound of scalp, initial encounter: Secondary | ICD-10-CM | POA: Diagnosis not present

## 2013-02-08 DIAGNOSIS — R509 Fever, unspecified: Secondary | ICD-10-CM

## 2013-02-08 DIAGNOSIS — S0101XA Laceration without foreign body of scalp, initial encounter: Secondary | ICD-10-CM

## 2013-02-08 DIAGNOSIS — G44219 Episodic tension-type headache, not intractable: Secondary | ICD-10-CM

## 2013-02-08 MED ORDER — DESVENLAFAXINE SUCCINATE ER 100 MG PO TB24: ORAL_TABLET | ORAL | Status: DC

## 2013-02-08 MED ORDER — TRAMADOL HCL 50 MG PO TABS: ORAL_TABLET | ORAL | Status: DC

## 2013-02-08 NOTE — Patient Instructions (Signed)
Take max of 1 fioricet per day for the next 1 week, then take 1 fioricet every other day for 1 week, then stop taking this medication. You may take tramadol as needed for HA pain.

## 2013-02-15 NOTE — Progress Notes (Signed)
OFFICE NOTE  02/15/2013  CC:  Chief Complaint  Patient presents with  . Follow-up    2 month f/u, also hospital follow up, also staple removal     HPI: Patient is a 67 y.o. Caucasian female who is here for hosp f/u for syncopal episode. She recounts the days leading up to the event being filled with worry/stress surrounding her husband's health--recent dx of possible mesothelioma.  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 some chest pain, eventually taken to hosp where she was found to be hypotensive, even had to have pressors.  She ruled out for MI, had no suspicious arrhythmias, and had a left heart cath which was normal except for moderate RCA stenosis.  She had fever the day before d/c and was started on levaquin for possible UTI.        She has not had any fever since going home 01/31/13.  No syncope or presyncope, no chest pain.  She has restarted her elavil, meloxicam, and lyrica but has remained off of lisinopril.  Says bp normal at home.  She was rx'd lipitor and plavix at d/c but has not filled these rx's yet and she has not made f/u appt with Dr. Sharyn Lull, the cardiologist who took care of her in the hospital. Her only complaints lately are stress/worry about her husband and some headaches that this is causing.  These are not described as migrainous.  She takes fioricet 2-3 times per day.  She also has had a few cold sores around mouth lately.  Also, when she passed out she had a right parietal scalp laceration and this was repaired with 3 staples in the ED and she needs these removed today.    Pertinent PMH:  Anxiety Fibromyalgia DDD, lumbar and cervical HTN BPPV Chronic HA syndrome Insomnia Past Surgical History  Procedure Laterality Date  . Appendectomy  1960  . Total abdominal hysterectomy  1974  . Tendon  release  1996    Right forearm and hand  . Knee surgery  2005  . Heel spur surgery  2008    left foot  . Plantar fascia release  2008    left  . Axillary surgery  1978    Multiple "lump" in armpit per pt  . Coccyx removal  1972  . Cardiac catheterization  01/2013    nonobstructive CAD, EF 55-60%  . Transthoracic echocardiogram  01/2013    NORMAL     MEDS:  Outpatient Prescriptions Prior to Visit  Medication Sig Dispense Refill  . albuterol (PROVENTIL HFA;VENTOLIN HFA) 108 (90 BASE) MCG/ACT inhaler Inhale 2 puffs into the lungs every 6 (six) hours as needed for wheezing. For shortness of breath      . aspirin EC 81 MG EC tablet Take 1 tablet (81 mg total) by mouth daily.  30 tablet  3  . budesonide-formoterol (SYMBICORT) 160-4.5 MCG/ACT inhaler Inhale 2 puffs into the lungs 2 (two) times daily.      . cyclobenzaprine (FLEXERIL) 10 MG tablet Take 10 mg by mouth 4 (four) times daily as needed for muscle spasms. For muscle spasms      . estrogens, conjugated, (PREMARIN) 0.9 MG tablet Take 0.9 mg by mouth daily.      . metoprolol succinate (TOPROL-XL) 50 MG 24 hr tablet Take 50 mg by mouth every morning.  Take with or immediately following a meal.      . Multiple Vitamins-Minerals (CENTRUM SILVER ADULT 50+ PO) Take 1 tablet by mouth daily.      . nitroGLYCERIN (NITROSTAT) 0.4 MG SL tablet Place 1 tablet (0.4 mg total) under the tongue every 5 (five) minutes x 3 doses as needed for chest pain.  25 tablet  3  . zolpidem (AMBIEN) 5 MG tablet Take 1 tablet (5 mg total) by mouth at bedtime as needed for sleep.  30 tablet  0  . butalbital-acetaminophen-caffeine (FIORICET, ESGIC) 50-325-40 MG per tablet Take 1-2 tablets by mouth every 6 (six) hours as needed for headache. For headache      . desvenlafaxine (PRISTIQ) 100 MG 24 hr tablet Take 1 tablet (100 mg total) by mouth daily.  90 tablet  1  . atorvastatin (LIPITOR) 20 MG tablet Take 1 tablet (20 mg total) by mouth daily at 6 PM.  30 tablet  3  .  clopidogrel (PLAVIX) 75 MG tablet Take 1 tablet (75 mg total) by mouth daily with breakfast.  30 tablet  3  . famotidine (PEPCID) 20 MG tablet Take 1 tablet (20 mg total) by mouth 2 (two) times daily.  60 tablet  3  . ALPRAZolam (XANAX) 0.25 MG tablet Take 1 tablet (0.25 mg total) by mouth 2 (two) times daily as needed for anxiety.  60 tablet  1  . levofloxacin (LEVAQUIN) 750 MG tablet Take 1 tablet (750 mg total) by mouth daily.  7 tablet  0   No facility-administered medications prior to visit.    PE: Blood pressure 130/76, pulse 89, height 5\' 4"  (1.626 m), weight 154 lb (69.854 kg). Gen: Alert, well appearing.  Patient is oriented to person, place, time, and situation. LKG:MWNU: no injection, icteris, swelling, or exudate.  EOMI, PERRLA. Nose: no drainage or turbinate edema/swelling.  No injection or focal lesion.  Mouth: lips with 4-5 dried, healing superficial ulcers. Oral mucosa pink and moist.  Dentition intact and without obvious caries or gingival swelling.  Oropharynx without erythema, exudate, or swelling.  CV: RRR, no m/r/g.   LUNGS: CTA bilat, nonlabored resps, good aeration in all lung fields.   IMPRESSION AND PLAN: 1) Syncope with hypotension:  Unclear etiology, but CV w/u seems reassuring.  I advised her to follow her cardiologist's recommendations of starting lipitor and plavix and I recommended she make f/u appt with him as he instructed.  2) Tension HA's: need to ween off fiorcet.  Will start trial of tramadol.  This may be helpful for her fibromyalgia as well.  3) Staple removal x 3: scalp wound looks good, healing well and no sign of infection.  4) Fever in hosp x 1d: no further sign of infection after leaving hosp.  No urine clx available.  Finish levaquin course as prescribed in hosp.  Spent 30 min with pt today, with >50% of this time spent in counseling and care coordination regarding the above problems.  An After Visit Summary was printed and given to the  patient.   FOLLOW UP: 2-3 wks: f/u HA's, fibro, bp's.

## 2013-02-26 ENCOUNTER — Ambulatory Visit: Payer: Medicare Other | Admitting: Family Medicine

## 2013-03-01 ENCOUNTER — Encounter: Payer: Self-pay | Admitting: Family Medicine

## 2013-03-01 ENCOUNTER — Ambulatory Visit (INDEPENDENT_AMBULATORY_CARE_PROVIDER_SITE_OTHER): Payer: Medicare Other | Admitting: Family Medicine

## 2013-03-01 VITALS — BP 150/92 | HR 88 | Temp 98.4°F | Ht 64.0 in | Wt 163.0 lb

## 2013-03-01 DIAGNOSIS — G8929 Other chronic pain: Secondary | ICD-10-CM | POA: Insufficient documentation

## 2013-03-01 DIAGNOSIS — IMO0001 Reserved for inherently not codable concepts without codable children: Secondary | ICD-10-CM | POA: Diagnosis not present

## 2013-03-01 DIAGNOSIS — M797 Fibromyalgia: Secondary | ICD-10-CM

## 2013-03-01 MED ORDER — DESVENLAFAXINE SUCCINATE ER 50 MG PO TB24: ORAL_TABLET | ORAL | Status: DC

## 2013-03-01 NOTE — Assessment & Plan Note (Signed)
I think we can get her better: will ween off pristiq (50mg  qd x 4d, then 50mg  qod x 2 doses). When finished with pristiq, start savella 25mg  bid x 7d, then increase to 50mg  bid (sufficient samples given to get her to next f/u in 1 mo). Therapeutic expectations and side effect profile of medication discussed today.  Patient's questions answered. Signs/symptoms to call or return for were reviewed and pt expressed understanding.

## 2013-03-01 NOTE — Progress Notes (Signed)
OFFICE NOTE  03/01/2013  CC:  Chief Complaint  Patient presents with  . Follow-up    HA's, BP, anxiety-worse due to husband's illness, fibromyalgia     HPI: Patient is a 68 y.o. Caucasian female who is here for f/u fibromyalgia and chronic tension headaches. Weened off of fiorcet.  Tried tramadol and she says it hasn't helped much with HAs or myalgias. No adverse side effects from tramadol.  She has started the toprol and plavix that the cardiologist in hosp rx'd for her but she has not been able to f/u with him in the office due to inclement weather and her husband's stint in the ICU recently. She says home bp checks have been normal mostly, occ high but no lows.  She can't say for sure that pristiq has helped her with mood or fibro pain. She was tried on zoloft, prozac, cymbalta--all didn't help but she can't recall any adverse effects from these. She is open to trial of different med to help with depression and fibromyalgia pain--savella.   ROS: no CP, no SOB, no palpitations, no dizziness or presyncope.  Pertinent PMH:  Chronic HA's Fibromyalgia Cervical and lumbar DDD H/o syncope and subsequent hypotension Past surgical, social, and family history reviewed and no changes noted since last office visit. HTN Asthma Insomnia Nonobstructive CAD Anxiety/depression  Past surgical, social, and family history reviewed and no changes (except husband's illness noted in HPI) noted since last office visit.  MEDS:  Outpatient Prescriptions Prior to Visit  Medication Sig Dispense Refill  . albuterol (PROVENTIL HFA;VENTOLIN HFA) 108 (90 BASE) MCG/ACT inhaler Inhale 2 puffs into the lungs every 6 (six) hours as needed for wheezing. For shortness of breath      . ALPRAZolam (XANAX) 1 MG tablet Take 1 mg by mouth 4 (four) times daily.      [name] Kitchen amitriptyline (ELAVIL) 100 MG tablet Take 100 mg by mouth daily.      [name] Kitchen aspirin EC 81 MG EC tablet Take 1 tablet (81 mg total) by mouth daily.  30  tablet  3  . atorvastatin (LIPITOR) 20 MG tablet Take 1 tablet (20 mg total) by mouth daily at 6 PM.  30 tablet  3  . budesonide-formoterol (SYMBICORT) 160-4.5 MCG/ACT inhaler Inhale 2 puffs into the lungs 2 (two) times daily.      . clopidogrel (PLAVIX) 75 MG tablet Take 1 tablet (75 mg total) by mouth daily with breakfast.  30 tablet  3  . cyclobenzaprine (FLEXERIL) 10 MG tablet Take 10 mg by mouth 4 (four) times daily as needed for muscle spasms. For muscle spasms      . estrogens, conjugated, (PREMARIN) 0.9 MG tablet Take 0.9 mg by mouth daily.      . meloxicam (MOBIC) 7.5 MG tablet Take 7.5 mg by mouth 2 (two) times daily.      . metoprolol succinate (TOPROL-XL) 50 MG 24 hr tablet Take 50 mg by mouth every morning. Take with or immediately following a meal.      . Multiple Vitamins-Minerals (CENTRUM SILVER ADULT 50+ PO) Take 1 tablet by mouth daily.      . nitroGLYCERIN (NITROSTAT) 0.4 MG SL tablet Place 1 tablet (0.4 mg total) under the tongue every 5 (five) minutes x 3 doses as needed for chest pain.  25 tablet  3  . pregabalin (LYRICA) 300 MG capsule Take 300 mg by mouth 2 (two) times daily.      . traMADol (ULTRAM) 50 MG tablet 1-2 tabs po  q6h prn pain  30 tablet  0  . zolpidem (AMBIEN) 5 MG tablet Take 1 tablet (5 mg total) by mouth at bedtime as needed for sleep.  30 tablet  0  . desvenlafaxine (PRISTIQ) 100 MG 24 hr tablet 1 tabs po qAM  90 tablet  1  . famotidine (PEPCID) 20 MG tablet Take 1 tablet (20 mg total) by mouth 2 (two) times daily.  60 tablet  3  . lisinopril (PRINIVIL,ZESTRIL) 40 MG tablet Take 40 mg by mouth daily as needed. If BP is elevated.       No facility-administered medications prior to visit.    PE: Blood pressure 150/92, pulse 88, temperature 98.4 F (36.9 C), temperature source Temporal, height 5\' 4"  (1.626 m), weight 163 lb (73.936 kg). Gen: Alert, well appearing.  Patient is oriented to person, place, time, and situation. CV: RRR, no m/r/g.   LUNGS: CTA  bilat, nonlabored resps, good aeration in all lung fields. EXT: no clubbing, cyanosis, or edema.    IMPRESSION AND PLAN:  Fibromyalgia syndrome I think we can get her better: will ween off pristiq (50mg  qd x 4d, then 50mg  qod x 2 doses). When finished with pristiq, start savella 25mg  bid x 7d, then increase to 50mg  bid (sufficient samples given to get her to next f/u in 1 mo). Therapeutic expectations and side effect profile of medication discussed today.  Patient's questions answered. Signs/symptoms to call or return for were reviewed and pt expressed understanding.   No further hypotension, but I want to get her stable on savella before adding lisinopril back on to her med regimen.  Spent 30 min with pt today, with >50% of this time spent in counseling and care coordination regarding the above problems.  An After Visit Summary was printed and given to the patient.  FOLLOW UP: 51mo

## 2013-03-01 NOTE — Patient Instructions (Signed)
After your last day of pristique, start savella: 25mg  twice daily for 7d, then increase to 50mg  twice daily.  I will see you again in 17mo to see how you are doing on this dose (see samples).

## 2013-03-10 ENCOUNTER — Encounter: Payer: Self-pay | Admitting: Family Medicine

## 2013-03-10 ENCOUNTER — Ambulatory Visit (INDEPENDENT_AMBULATORY_CARE_PROVIDER_SITE_OTHER): Payer: Medicare Other | Admitting: Family Medicine

## 2013-03-10 VITALS — BP 164/84 | HR 99 | Temp 97.5°F | Resp 14 | Wt 161.8 lb

## 2013-03-10 DIAGNOSIS — J180 Bronchopneumonia, unspecified organism: Secondary | ICD-10-CM | POA: Diagnosis not present

## 2013-03-10 MED ORDER — ALBUTEROL SULFATE (5 MG/ML) 0.5% IN NEBU: 2.5000 mg | INHALATION_SOLUTION | RESPIRATORY_TRACT | Status: DC

## 2013-03-10 MED ORDER — PREDNISONE 20 MG PO TABS: ORAL_TABLET | ORAL | Status: DC

## 2013-03-10 MED ORDER — IPRATROPIUM BROMIDE 0.02 % IN SOLN: 0.5000 mg | RESPIRATORY_TRACT | Status: DC

## 2013-03-10 MED ORDER — IPRATROPIUM BROMIDE 0.02 % IN SOLN: 0.5000 mg | Freq: Once | RESPIRATORY_TRACT | Status: DC

## 2013-03-10 MED ORDER — CLARITHROMYCIN 500 MG PO TABS: 500.0000 mg | ORAL_TABLET | Freq: Two times a day (BID) | ORAL | Status: DC

## 2013-03-10 MED ORDER — IPRATROPIUM-ALBUTEROL 0.5-2.5 (3) MG/3ML IN SOLN
3.0000 mL | Freq: Once | RESPIRATORY_TRACT | Status: AC
  Administered 2013-03-10: 3 mL via RESPIRATORY_TRACT

## 2013-03-10 MED ORDER — PREGABALIN 300 MG PO CAPS: 300.0000 mg | ORAL_CAPSULE | Freq: Two times a day (BID) | ORAL | Status: DC

## 2013-03-10 NOTE — Progress Notes (Signed)
OFFICE NOTE  03/10/2013  CC:  Chief Complaint  Patient presents with  . Headache    terrible headaches and blurry vision  . Cough    Congestion, spitting up brown mucus.  . Shortness of Breath     HPI: Patient is a 67 y.o. Caucasian female who is here for resp illness. Pt presents complaining of respiratory symptoms for 5  days.  Primary symptoms are: coughing, hurts to breath and cough, productive cough some--brownish.  Now dry cough.  Worst symptoms seems to be the cough/hurts to breath.  Lately the symptoms seem to be worsening.  TM 99.5. Pertinent negatives:  No pain in face or teeth.  No significant HA.  ST mild at most.   Symptoms made worse by cold air, activity, talking.  Symptoms improved by lying on couch.  No symptomatic meds have been tried. Smoker? no Recent sick contact? unknown Muscle or joint aches? Aches in both thighs and lower legs and from chest up to top of head. Flu shot this season at least 2 wks ago? yes  Additional ROS: no n/v/d or abdominal pain.  No rash.  No neck stiffness.   +Mild fatigue.  +Mild appetite loss.   Pertinent PMH:  Hx of pneumonia. Asthma HTN Insomnia Hx of Iron def anemia Fibromyalgia syndrome  MEDS:  Outpatient Prescriptions Prior to Visit  Medication Sig Dispense Refill  . albuterol (PROVENTIL HFA;VENTOLIN HFA) 108 (90 BASE) MCG/ACT inhaler Inhale 2 puffs into the lungs every 6 (six) hours as needed for wheezing. For shortness of breath      . ALPRAZolam (XANAX) 1 MG tablet Take 1 mg by mouth 4 (four) times daily.      [name] Kitchen amitriptyline (ELAVIL) 100 MG tablet Take 100 mg by mouth daily.      [name] Kitchen aspirin EC 81 MG EC tablet Take 1 tablet (81 mg total) by mouth daily.  30 tablet  3  . atorvastatin (LIPITOR) 20 MG tablet Take 1 tablet (20 mg total) by mouth daily at 6 PM.  30 tablet  3  . budesonide-formoterol (SYMBICORT) 160-4.5 MCG/ACT inhaler Inhale 2 puffs into the lungs 2 (two) times daily.      . clopidogrel (PLAVIX) 75 MG  tablet Take 1 tablet (75 mg total) by mouth daily with breakfast.  30 tablet  3  . cyclobenzaprine (FLEXERIL) 10 MG tablet Take 10 mg by mouth 4 (four) times daily as needed for muscle spasms. For muscle spasms      . desvenlafaxine (PRISTIQ) 50 MG 24 hr tablet 1 tab po qd x 4d, then 1 tab po qod x 2 doses, then stop  6 tablet  0  . estrogens, conjugated, (PREMARIN) 0.9 MG tablet Take 0.9 mg by mouth daily.      [name] Kitchen lisinopril (PRINIVIL,ZESTRIL) 40 MG tablet Take 40 mg by mouth daily as needed. If BP is elevated.      . meloxicam (MOBIC) 7.5 MG tablet Take 7.5 mg by mouth 2 (two) times daily.      . metoprolol succinate (TOPROL-XL) 50 MG 24 hr tablet Take 50 mg by mouth every morning. Take with or immediately following a meal.      . Multiple Vitamins-Minerals (CENTRUM SILVER ADULT 50+ PO) Take 1 tablet by mouth daily.      . nitroGLYCERIN (NITROSTAT) 0.4 MG SL tablet Place 1 tablet (0.4 mg total) under the tongue every 5 (five) minutes x 3 doses as needed for chest pain.  25 tablet  3  .  pregabalin (LYRICA) 300 MG capsule Take 300 mg by mouth 2 (two) times daily.      [name] Kitchen zolpidem (AMBIEN) 5 MG tablet Take 1 tablet (5 mg total) by mouth at bedtime as needed for sleep.  30 tablet  0  . famotidine (PEPCID) 20 MG tablet Take 1 tablet (20 mg total) by mouth 2 (two) times daily.  60 tablet  3  . traMADol (ULTRAM) 50 MG tablet 1-2 tabs po q6h prn pain  30 tablet  0   No facility-administered medications prior to visit.    PE: Blood pressure 164/84, pulse 99, temperature 97.5 F (36.4 C), temperature source Temporal, resp. rate 14, weight 161 lb 12 oz (73.369 kg), SpO2 97.00%. Gen: Alert, tired-appearing but nontoxic.  Coughing frequently.  NAD.  Patient is oriented to person, place, time, and situation. ENT: Ears: EACs clear, normal epithelium.  TMs with good light reflex and landmarks bilaterally.  Small (1 mm) oval TM perf in right TM.  NO middle ear fluid noted.  Eyes: no injection, icteris, swelling,  or exudate.  EOMI, PERRLA. Nose: no drainage or turbinate edema/swelling.  No injection or focal lesion.  Mouth: lips without lesion/swelling.  Oral mucosa pink and moist.   Oropharynx without erythema, exudate, or swelling.  Small mucosal erosion at back of alveolar ridge on buccal mucosa on right upper side of mouth.  No bleeding. Neck - No masses or thyromegaly or limitation in range of motion CV: RRR LUNGS: CTA bilat, but question of focally diminished BS in right base, +prolonged exp phase and limited aeration on expiration, but no wheezing audible.  Breathing nonlabored.  IMPRESSION AND PLAN:  Bronchopneumonia Alb/atr neb in office today: Clarithromycin 500mg  bid x 10d--hold statin while on this med. Continue symbicort and rescue albuterol. Prednisone 40mg  qd x 5d. Mucinex DM OTC recommended.  Rest, fluids. F/u in office 5d. Signs/symptoms to call or return for were reviewed and pt expressed understanding.    An After Visit Summary was printed and given to the patient.   FOLLOW UP: 5d

## 2013-03-10 NOTE — Assessment & Plan Note (Addendum)
Alb/atr neb in office today: Clarithromycin 500mg  bid x 10d--hold statin while on this med. Continue symbicort and rescue albuterol. Prednisone 40mg  qd x 5d. Mucinex DM OTC recommended.  Rest, fluids. F/u in office 5d. Signs/symptoms to call or return for were reviewed and pt expressed understanding.

## 2013-03-15 ENCOUNTER — Ambulatory Visit (INDEPENDENT_AMBULATORY_CARE_PROVIDER_SITE_OTHER)
Admission: RE | Admit: 2013-03-15 | Discharge: 2013-03-15 | Disposition: A | Discharge status: Home / Self Care | Payer: Medicare Other | Source: Ambulatory Visit | Attending: Family Medicine | Admitting: Family Medicine

## 2013-03-15 ENCOUNTER — Ambulatory Visit (INDEPENDENT_AMBULATORY_CARE_PROVIDER_SITE_OTHER): Payer: Medicare Other | Admitting: Family Medicine

## 2013-03-15 ENCOUNTER — Encounter: Payer: Self-pay | Admitting: Family Medicine

## 2013-03-15 VITALS — BP 162/81 | HR 96 | Temp 98.5°F | Resp 24 | Wt 167.0 lb

## 2013-03-15 DIAGNOSIS — R0609 Other forms of dyspnea: Secondary | ICD-10-CM

## 2013-03-15 DIAGNOSIS — J180 Bronchopneumonia, unspecified organism: Secondary | ICD-10-CM

## 2013-03-15 DIAGNOSIS — R059 Cough, unspecified: Secondary | ICD-10-CM

## 2013-03-15 DIAGNOSIS — R791 Abnormal coagulation profile: Secondary | ICD-10-CM

## 2013-03-15 DIAGNOSIS — R609 Edema, unspecified: Secondary | ICD-10-CM | POA: Diagnosis not present

## 2013-03-15 DIAGNOSIS — R0602 Shortness of breath: Secondary | ICD-10-CM | POA: Diagnosis not present

## 2013-03-15 DIAGNOSIS — R0989 Other specified symptoms and signs involving the circulatory and respiratory systems: Secondary | ICD-10-CM | POA: Diagnosis not present

## 2013-03-15 DIAGNOSIS — R600 Localized edema: Secondary | ICD-10-CM

## 2013-03-15 DIAGNOSIS — R05 Cough: Secondary | ICD-10-CM

## 2013-03-15 DIAGNOSIS — R091 Pleurisy: Secondary | ICD-10-CM | POA: Diagnosis not present

## 2013-03-15 DIAGNOSIS — R7989 Other specified abnormal findings of blood chemistry: Secondary | ICD-10-CM

## 2013-03-15 LAB — COMPREHENSIVE METABOLIC PANEL
ALT: 17 U/L (ref 0–35)
AST: 21 U/L (ref 0–37)
Alkaline Phosphatase: 56 U/L (ref 39–117)
CO2: 28 mEq/L (ref 19–32)
Sodium: 142 mEq/L (ref 135–145)
Total Bilirubin: 0.4 mg/dL (ref 0.3–1.2)
Total Protein: 6.3 g/dL (ref 6.0–8.3)

## 2013-03-15 LAB — CBC WITH DIFFERENTIAL/PLATELET
Basophils Absolute: 0 10*3/uL (ref 0.0–0.1)
Eosinophils Absolute: 0 10*3/uL (ref 0.0–0.7)
Eosinophils Relative: 0 % (ref 0–5)
MCH: 29.1 pg (ref 26.0–34.0)
MCHC: 34.3 g/dL (ref 30.0–36.0)
MCV: 84.9 fL (ref 78.0–100.0)
Platelets: 354 10*3/uL (ref 150–400)
RDW: 13.6 % (ref 11.5–15.5)

## 2013-03-15 LAB — BRAIN NATRIURETIC PEPTIDE: Brain Natriuretic Peptide: 274.2 pg/mL — ABNORMAL HIGH (ref 0.0–100.0)

## 2013-03-15 MED ORDER — OLMESARTAN MEDOXOMIL 20 MG PO TABS: 20.0000 mg | ORAL_TABLET | Freq: Every day | ORAL | Status: DC

## 2013-03-15 MED ORDER — HYDROCODONE-HOMATROPINE 5-1.5 MG/5ML PO SYRP: ORAL_SOLUTION | ORAL | Status: DC

## 2013-03-15 MED ORDER — LEVOFLOXACIN 500 MG PO TABS: 500.0000 mg | ORAL_TABLET | Freq: Every day | ORAL | Status: DC

## 2013-03-15 MED ORDER — IPRATROPIUM-ALBUTEROL 0.5-2.5 (3) MG/3ML IN SOLN
3.0000 mL | RESPIRATORY_TRACT | Status: AC
  Administered 2013-03-15: 3 mL via RESPIRATORY_TRACT

## 2013-03-15 MED ORDER — PREDNISONE 20 MG PO TABS: ORAL_TABLET | ORAL | Status: DC

## 2013-03-15 NOTE — Progress Notes (Signed)
OFFICE NOTE  03/15/2013  CC:  Chief Complaint  Patient presents with  . Follow-up    5 day for Bronchopneumonia     HPI: Patient is a 67 y.o. Caucasian female who is here for 5d f/u for bronchopneumonia, for which I put her on clarithromycin and prednisone.  She is no better.  Tm 99-100.  Using alb inhaler 3-4 times per day and it helps some. New sx: she notes increased fullness/bloating in abdomen + mild swelling in both LE's.  Sleeps on 3 pillows but in exam room today she was lying supine as the preferred position when I walked into the room.  Question of periods of PND. Still with persistent nonproductive cough with periods of wheezing.  Rare sputum production is rust colored.  HTN: ever since her recent period of hypotension/hospitalization, she has been taking only a 50mg  toprol XL in the morning and holding her lisinopril unless her bp got over 180/100.  She admits that many days it has been over this and she has had to take a lisinopril 40mg  qd.    ROS: no HA, no focal weakness, no n/v/d.  Appetite is good.  ++Fatigue. No rash or joint swelling.  +Stress incontinence assoc with all of her coughing.  No dysuria, urinary frequency, or hematuria.  No constipation.  No dizziness.  Pertinent PMH:  Asthma with asbestosis HTN Anxiety Fibromyalgia syndrome Insomnia Hyperlipidemia Nonobstructive CAD, hx of unexplained period of hypotension 01/2013  Past Surgical History  Procedure Laterality Date  . Appendectomy  1960  . Total abdominal hysterectomy  1974  . Tendon release  1996    Right forearm and hand  . Knee surgery  2005  . Heel spur surgery  2008    left foot  . Plantar fascia release  2008    left  . Axillary surgery  1978    Multiple "lump" in armpit per pt  . Coccyx removal  1972  . Cardiac catheterization  01/2013    nonobstructive CAD, EF 55-60%  . Transthoracic echocardiogram  01/2013    NORMAL   SH: still under a lot of stress taking care of her sick husband  and she is NOT RESTING ANY.  MEDS:  Outpatient Prescriptions Prior to Visit  Medication Sig Dispense Refill  . albuterol (PROVENTIL HFA;VENTOLIN HFA) 108 (90 BASE) MCG/ACT inhaler Inhale 2 puffs into the lungs every 6 (six) hours as needed for wheezing. For shortness of breath      . ALPRAZolam (XANAX) 1 MG tablet Take 1 mg by mouth 4 (four) times daily.      [name] Kitchen amitriptyline (ELAVIL) 100 MG tablet Take 100 mg by mouth daily.      [name] Kitchen aspirin EC 81 MG EC tablet Take 1 tablet (81 mg total) by mouth daily.  30 tablet  3  . atorvastatin (LIPITOR) 20 MG tablet Take 1 tablet (20 mg total) by mouth daily at 6 PM.  30 tablet  3  . budesonide-formoterol (SYMBICORT) 160-4.5 MCG/ACT inhaler Inhale 2 puffs into the lungs 2 (two) times daily.      . clopidogrel (PLAVIX) 75 MG tablet Take 1 tablet (75 mg total) by mouth daily with breakfast.  30 tablet  3  . cyclobenzaprine (FLEXERIL) 10 MG tablet Take 10 mg by mouth 4 (four) times daily as needed for muscle spasms. For muscle spasms      . estrogens, conjugated, (PREMARIN) 0.9 MG tablet Take 0.9 mg by mouth daily.      . meloxicam (  MOBIC) 7.5 MG tablet Take 7.5 mg by mouth 2 (two) times daily.      . metoprolol succinate (TOPROL-XL) 50 MG 24 hr tablet Take 50 mg by mouth every morning. Take with or immediately following a meal.      . Multiple Vitamins-Minerals (CENTRUM SILVER ADULT 50+ PO) Take 1 tablet by mouth daily.      . nitroGLYCERIN (NITROSTAT) 0.4 MG SL tablet Place 1 tablet (0.4 mg total) under the tongue every 5 (five) minutes x 3 doses as needed for chest pain.  25 tablet  3  . pregabalin (LYRICA) 300 MG capsule Take 1 capsule (300 mg total) by mouth 2 (two) times daily.  60 capsule  6  . traMADol (ULTRAM) 50 MG tablet 1-2 tabs po q6h prn pain  30 tablet  0  . zolpidem (AMBIEN) 5 MG tablet Take 1 tablet (5 mg total) by mouth at bedtime as needed for sleep.  30 tablet  0  . clarithromycin (BIAXIN) 500 MG tablet Take 1 tablet (500 mg total) by mouth  2 (two) times daily.  20 tablet  0  . lisinopril (PRINIVIL,ZESTRIL) 40 MG tablet Take 40 mg by mouth daily as needed. If BP is elevated.      . predniSONE (DELTASONE) 20 MG tablet 2 tabs po qd x 5d  10 tablet  0  . desvenlafaxine (PRISTIQ) 50 MG 24 hr tablet 1 tab po qd x 4d, then 1 tab po qod x 2 doses, then stop  6 tablet  0  . famotidine (PEPCID) 20 MG tablet Take 1 tablet (20 mg total) by mouth 2 (two) times daily.  60 tablet  3   No facility-administered medications prior to visit.    PE: Blood pressure 162/81, pulse 96, temperature 98.5 F (36.9 C), temperature source Temporal, resp. rate 24, weight 167 lb (75.751 kg), SpO2 99.00%. Gen: alert, mild resp distress, oriented x 4.  Lucid conversation. ENT: Eyes: no injection, icteris, swelling, or exudate.  EOMI, PERRLA. Nose: no drainage or turbinate edema/swelling.  No injection or focal lesion.  Mouth: lips without lesion/swelling.   CV: Regular, tachy to 120, no m/r/g LUNGS: CTA bilat, RR 60s, mild suprasternal/supraclavicular retractions. ABD: soft, mild diffuse upper abd discomfort to palpation, without guarding or rebound.  NO mass or HSM. EXT: no cyanosis, no clubbing.  1-2 + pitting edema from ankles to mid tibial level bilat  LAB: 12 lead EKG  IMPRESSION AND PLAN:  Bronchopneumonia Improved with alb/atr neb in office today, but overall she is not any better than when I saw her 5d/a. I will be more aggressive by increasing prednisone to 60mg  qd x 5d, then 40mg  qd x 5d, then 20mg  qd x 5d, then 10mg  qd x 4d. Will d/c clarithromycin and start levaquin 500mg  qd x 7d. Also, I want to take her ACE-I out of the equation altogether, so I have d/c'd this and started benicar 20mg  qd in it's place.  She has instructions to increase to 40mg  qd if necessary.  Check CXR today as well as CBC, D-dimer, CMET, and BNP. EKG here today showed no acute abnormalities. REcheck in office in 2d.  Continue to hold statin until this illness is  over.   An After Visit Summary was printed and given to the patient.  Spent 50 min with pt today, with >50% of this time spent in counseling and care coordination regarding the above problems.  FOLLOW UP: 2d

## 2013-03-15 NOTE — Patient Instructions (Addendum)
Stop lisinopril. Start Benicar 20mg  tabs (samples given x 21 tabs), 1 tab per day.  Check bp once daily. If bp still >150 on top or > 95 on bottom then take TWO of the Benicar 20mg  tabs once daily.

## 2013-03-15 NOTE — Assessment & Plan Note (Addendum)
Improved with alb/atr neb in office today, but overall she is not any better than when I saw her 5d/a. I will be more aggressive by increasing prednisone to 60mg  qd x 5d, then 40mg  qd x 5d, then 20mg  qd x 5d, then 10mg  qd x 4d. Will d/c clarithromycin and start levaquin 500mg  qd x 7d. Also, I want to take her ACE-I out of the equation altogether, so I have d/c'd this and started benicar 20mg  qd in it's place.  She has instructions to increase to 40mg  qd if necessary.  Check CXR today as well as CBC, D-dimer, CMET, and BNP. EKG here today showed no acute abnormalities. REcheck in office in 2d.  Continue to hold statin until this illness is over.

## 2013-03-16 ENCOUNTER — Telehealth: Payer: Self-pay | Admitting: *Deleted

## 2013-03-16 ENCOUNTER — Ambulatory Visit (HOSPITAL_BASED_OUTPATIENT_CLINIC_OR_DEPARTMENT_OTHER)
Admission: RE | Admit: 2013-03-16 | Discharge: 2013-03-16 | Disposition: A | Discharge status: Home / Self Care | Payer: Medicare Other | Source: Ambulatory Visit | Attending: Family Medicine | Admitting: Family Medicine

## 2013-03-16 ENCOUNTER — Encounter (HOSPITAL_BASED_OUTPATIENT_CLINIC_OR_DEPARTMENT_OTHER): Payer: Self-pay

## 2013-03-16 DIAGNOSIS — R0602 Shortness of breath: Secondary | ICD-10-CM | POA: Diagnosis not present

## 2013-03-16 DIAGNOSIS — E876 Hypokalemia: Secondary | ICD-10-CM

## 2013-03-16 DIAGNOSIS — R059 Cough, unspecified: Secondary | ICD-10-CM

## 2013-03-16 DIAGNOSIS — R05 Cough: Secondary | ICD-10-CM

## 2013-03-16 DIAGNOSIS — R0609 Other forms of dyspnea: Secondary | ICD-10-CM

## 2013-03-16 DIAGNOSIS — R091 Pleurisy: Secondary | ICD-10-CM | POA: Diagnosis not present

## 2013-03-16 DIAGNOSIS — R7989 Other specified abnormal findings of blood chemistry: Secondary | ICD-10-CM

## 2013-03-16 MED ORDER — POTASSIUM CHLORIDE CRYS ER 20 MEQ PO TBCR: EXTENDED_RELEASE_TABLET | ORAL | Status: DC

## 2013-03-16 MED ORDER — IOHEXOL 350 MG/ML SOLN
100.0000 mL | Freq: Once | INTRAVENOUS | Status: AC | PRN
  Administered 2013-03-16: 100 mL via INTRAVENOUS

## 2013-03-16 NOTE — Addendum Note (Signed)
Addended by: Jeoffrey Massed on: 03/16/2013 08:39 AM   Modules accepted: Orders

## 2013-03-16 NOTE — Telephone Encounter (Signed)
Message copied by Regis Bill on Tue Mar 16, 2013 10:07 AM ------      Message from: Jeoffrey Massed      Created: Tue Mar 16, 2013  9:02 AM       Pls call pt again and tell her that her potassium was a little low.  Sometimes it is transiently low from frequent albuterol administration like she has been doing lately, but I recommend taking a short course of rx potassium supplement and then we'll recheck it in 6-7d.      I'll do eRx for the potassium.  Pls set up lab visit in 6-7d for recheck of BMET, dx hypokalemia.-thx ------

## 2013-03-16 NOTE — Telephone Encounter (Signed)
Patient informed, understood & agreed; lab appt scheduled for Wed, 04.02.14 [pt could not come in earlier in the week]/SLS

## 2013-03-16 NOTE — Addendum Note (Signed)
Addended by: Jeoffrey Massed on: 03/16/2013 09:03 AM   Modules accepted: Orders

## 2013-03-18 ENCOUNTER — Ambulatory Visit (INDEPENDENT_AMBULATORY_CARE_PROVIDER_SITE_OTHER): Payer: Medicare Other | Admitting: Family Medicine

## 2013-03-18 ENCOUNTER — Encounter: Payer: Self-pay | Admitting: Family Medicine

## 2013-03-18 VITALS — BP 178/90 | HR 89 | Temp 97.8°F | Resp 14 | Wt 165.0 lb

## 2013-03-18 DIAGNOSIS — G4700 Insomnia, unspecified: Secondary | ICD-10-CM

## 2013-03-18 DIAGNOSIS — J180 Bronchopneumonia, unspecified organism: Secondary | ICD-10-CM | POA: Diagnosis not present

## 2013-03-18 DIAGNOSIS — I10 Essential (primary) hypertension: Secondary | ICD-10-CM | POA: Diagnosis not present

## 2013-03-18 MED ORDER — OLMESARTAN MEDOXOMIL 40 MG PO TABS: 40.0000 mg | ORAL_TABLET | Freq: Every day | ORAL | Status: DC

## 2013-03-18 MED ORDER — OXYCODONE HCL 5 MG PO TABS: ORAL_TABLET | ORAL | Status: DC

## 2013-03-18 NOTE — Assessment & Plan Note (Signed)
She is improving. Continue levaquin, prednisone, bronchodilator.

## 2013-03-18 NOTE — Progress Notes (Signed)
OFFICE NOTE  03/18/2013  CC:  Chief Complaint  Patient presents with  . Follow-up    2 days, Bronchopneumonia     HPI: Patient is a 67 y.o. Caucasian female who is here for 2d f/u acute LRTI. Feeling 50% improved! No fevers.  Cough and SOB improving.  Appetite still down and she says she did not sleep AT ALL last night.  Pertinent PMH:  HTN Fibromyalgia HA syndrome Hx of asthmatic bronchitis Hx of asbestos exposure/pulm changes on x-ray Anxiety Insomnia MEDS:  Outpatient Prescriptions Prior to Visit  Medication Sig Dispense Refill  . albuterol (PROVENTIL HFA;VENTOLIN HFA) 108 (90 BASE) MCG/ACT inhaler Inhale 2 puffs into the lungs every 6 (six) hours as needed for wheezing. For shortness of breath      . ALPRAZolam (XANAX) 1 MG tablet Take 1 mg by mouth 4 (four) times daily.      [name] Kitchen amitriptyline (ELAVIL) 100 MG tablet Take 100 mg by mouth daily.      [name] Kitchen aspirin EC 81 MG EC tablet Take 1 tablet (81 mg total) by mouth daily.  30 tablet  3  . atorvastatin (LIPITOR) 20 MG tablet Take 1 tablet (20 mg total) by mouth daily at 6 PM.  30 tablet  3  . budesonide-formoterol (SYMBICORT) 160-4.5 MCG/ACT inhaler Inhale 2 puffs into the lungs 2 (two) times daily.      . clopidogrel (PLAVIX) 75 MG tablet Take 1 tablet (75 mg total) by mouth daily with breakfast.  30 tablet  3  . cyclobenzaprine (FLEXERIL) 10 MG tablet Take 10 mg by mouth 4 (four) times daily as needed for muscle spasms. For muscle spasms      . estrogens, conjugated, (PREMARIN) 0.9 MG tablet Take 0.9 mg by mouth daily.      . famotidine (PEPCID) 20 MG tablet Take 1 tablet (20 mg total) by mouth 2 (two) times daily.  60 tablet  3  . HYDROcodone-homatropine (HYCODAN) 5-1.5 MG/5ML syrup 1-2 tsp po q6h prn cough  120 mL  0  . levofloxacin (LEVAQUIN) 500 MG tablet Take 1 tablet (500 mg total) by mouth daily.  7 tablet  0  . meloxicam (MOBIC) 7.5 MG tablet Take 7.5 mg by mouth 2 (two) times daily.      . metoprolol succinate  (TOPROL-XL) 50 MG 24 hr tablet Take 50 mg by mouth every morning. Take with or immediately following a meal.      . Multiple Vitamins-Minerals (CENTRUM SILVER ADULT 50+ PO) Take 1 tablet by mouth daily.      . nitroGLYCERIN (NITROSTAT) 0.4 MG SL tablet Place 1 tablet (0.4 mg total) under the tongue every 5 (five) minutes x 3 doses as needed for chest pain.  25 tablet  3  . potassium chloride SA (K-DUR,KLOR-CON) 20 MEQ tablet 2 tabs po qd x 3d, then 1 tab po qd x 2d, then stop  8 tablet  0  . predniSONE (DELTASONE) 20 MG tablet 3 tabs po qd x 5d, then 2 tabs po qd x 5d, then 1 tab po qd x 5d, then 1/2 tab po qd x 4d, then stop.  32 tablet  0  . pregabalin (LYRICA) 300 MG capsule Take 1 capsule (300 mg total) by mouth 2 (two) times daily.  60 capsule  6  . traMADol (ULTRAM) 50 MG tablet 1-2 tabs po q6h prn pain  30 tablet  0  . zolpidem (AMBIEN) 5 MG tablet Take 1 tablet (5 mg total) by mouth at bedtime  as needed for sleep.  30 tablet  0  . olmesartan (BENICAR) 20 MG tablet Take 1 tablet (20 mg total) by mouth daily.  21 tablet  0  . desvenlafaxine (PRISTIQ) 50 MG 24 hr tablet 1 tab po qd x 4d, then 1 tab po qod x 2 doses, then stop  6 tablet  0   No facility-administered medications prior to visit.    PE: Blood pressure 178/90, pulse 89, temperature 97.8 F (36.6 C), temperature source Temporal, resp. rate 14, weight 165 lb (74.844 kg), SpO2 96.00%. Gen: Alert, well appearing.  Patient is oriented to person, place, time, and situation. LUNGS: CTA bilat, nonlabored resps.   CV: RRR, no m/r/g EXT 1+ pitting edema in anterior tibial regions bilat  IMPRESSION AND PLAN:  Bronchopneumonia She is improving. Continue levaquin, prednisone, bronchodilator.  HTN (hypertension), benign Poor control currently, primarily systolic. Increase benicar to 40mg  qd--#14 samples today. Continue toprol XL 50mg  qAM   Insomnia Worsened since husband dx'd with cancer and with her being on steroids and  frequent bronchodilators. She says she has a constant HA lately--tramadol didn't help the HA in the past. She currently takes a xanax, 50mg  amitriptyline, and ambien 10mg  at bedtime and last night she swears she did not sleep AT ALL. I rx'd oxycodone today to try to help with nighttime HA and help get her to sleep.  She says she has tolerated hydrocodone in the past with no sedation or impairment.  I also recommended she increase her amitriptyline back to the 100mg  qhs dosing as previously prescribed.   An After Visit Summary was printed and given to the patient.  FOLLOW UP: 1 wk

## 2013-03-18 NOTE — Assessment & Plan Note (Signed)
Poor control currently, primarily systolic. Increase benicar to 40mg  qd--#14 samples today. Continue toprol XL 50mg  qAM

## 2013-03-18 NOTE — Assessment & Plan Note (Addendum)
Worsened since husband dx'd with cancer and with her being on steroids and frequent bronchodilators. She says she has a constant HA lately--tramadol didn't help the HA in the past. She currently takes a xanax, 50mg  amitriptyline, and ambien 10mg  at bedtime and last night she swears she did not sleep AT ALL. I rx'd oxycodone today to try to help with nighttime HA and help get her to sleep.  She says she has tolerated hydrocodone in the past with no sedation or impairment.  I also recommended she increase her amitriptyline back to the 100mg  qhs dosing as previously prescribed.

## 2013-03-24 ENCOUNTER — Other Ambulatory Visit: Payer: Medicare Other

## 2013-03-25 ENCOUNTER — Ambulatory Visit (INDEPENDENT_AMBULATORY_CARE_PROVIDER_SITE_OTHER): Payer: Medicare Other | Admitting: Family Medicine

## 2013-03-25 ENCOUNTER — Encounter: Payer: Self-pay | Admitting: Family Medicine

## 2013-03-25 VITALS — BP 84/50 | HR 82 | Ht 64.0 in | Wt 168.0 lb

## 2013-03-25 DIAGNOSIS — E538 Deficiency of other specified B group vitamins: Secondary | ICD-10-CM

## 2013-03-25 DIAGNOSIS — I10 Essential (primary) hypertension: Secondary | ICD-10-CM | POA: Diagnosis not present

## 2013-03-25 DIAGNOSIS — D509 Iron deficiency anemia, unspecified: Secondary | ICD-10-CM | POA: Diagnosis not present

## 2013-03-25 DIAGNOSIS — IMO0001 Reserved for inherently not codable concepts without codable children: Secondary | ICD-10-CM

## 2013-03-25 DIAGNOSIS — M797 Fibromyalgia: Secondary | ICD-10-CM

## 2013-03-25 DIAGNOSIS — J180 Bronchopneumonia, unspecified organism: Secondary | ICD-10-CM

## 2013-03-25 LAB — CBC WITH DIFFERENTIAL/PLATELET
Basophils Absolute: 0 10*3/uL (ref 0.0–0.1)
Eosinophils Absolute: 0.2 10*3/uL (ref 0.0–0.7)
Lymphocytes Relative: 40.2 % (ref 12.0–46.0)
Lymphs Abs: 2.4 10*3/uL (ref 0.7–4.0)
MCHC: 33.4 g/dL (ref 30.0–36.0)
Monocytes Relative: 9.6 % (ref 3.0–12.0)
Platelets: 260 10*3/uL (ref 150.0–400.0)
RDW: 13.8 % (ref 11.5–14.6)

## 2013-03-25 LAB — BASIC METABOLIC PANEL
BUN: 27 mg/dL — ABNORMAL HIGH (ref 6–23)
CO2: 26 mEq/L (ref 19–32)
Calcium: 8.6 mg/dL (ref 8.4–10.5)
Creatinine, Ser: 0.9 mg/dL (ref 0.4–1.2)

## 2013-03-25 LAB — IBC PANEL: Iron: 41 ug/dL — ABNORMAL LOW (ref 42–145)

## 2013-03-25 MED ORDER — MILNACIPRAN HCL 50 MG PO TABS: 50.0000 mg | ORAL_TABLET | Freq: Two times a day (BID) | ORAL | Status: DC

## 2013-03-25 MED ORDER — OXYCODONE HCL 5 MG PO TABS: ORAL_TABLET | ORAL | Status: DC

## 2013-03-25 MED ORDER — CYANOCOBALAMIN 1000 MCG/ML IJ SOLN
1000.0000 ug | Freq: Once | INTRAMUSCULAR | Status: AC
  Administered 2013-03-25: 1000 ug via INTRAMUSCULAR

## 2013-03-25 NOTE — Progress Notes (Signed)
OFFICE NOTE  03/25/2013  CC:  Chief Complaint  Patient presents with  . Follow-up    HTN     HPI: Patient is a 67 y.o. Caucasian female who is here for f/u HTN and recent bronchopneumonia. Feeling somewhat improved from resp standpoint.  No fevers.   Says home BPs lately 160-180 syst over 95 diastolic until yesterday when it was down into normal range. Today she feels a little lightheaded and bp is on the low side.  She has not taken her benicar yet today. Last visit we increased her benicar to 40mg  qd b/c we've slowly been titrating bp meds, afraid that she may drop low ----she has a hx of an episode of unexplained, profound/prolonged hypotension requiring pressors in hospital. She reminds me today that she has a hx of iron def anemia and before she moved her from [facility] she had been getting regular IV iron and she wonders if this may need to be done.  She says she feels like she usually does when it is time to get it done.  She also reminds me she used to get monthly vit B12 injections.    Pertinent PMH:  HTN Dep/Anx Fibromyalgia Asthma Chronic iron def anemia Insomnia Chronic HA syndrome Hyperlipidemia  MEDS:  Outpatient Prescriptions Prior to Visit  Medication Sig Dispense Refill  . albuterol (PROVENTIL HFA;VENTOLIN HFA) 108 (90 BASE) MCG/ACT inhaler Inhale 2 puffs into the lungs every 6 (six) hours as needed for wheezing. For shortness of breath      . ALPRAZolam (XANAX) 1 MG tablet Take 1 mg by mouth 4 (four) times daily.      [name] Kitchen amitriptyline (ELAVIL) 100 MG tablet Take 100 mg by mouth daily.      [name] Kitchen aspirin EC 81 MG EC tablet Take 1 tablet (81 mg total) by mouth daily.  30 tablet  3  . atorvastatin (LIPITOR) 20 MG tablet Take 1 tablet (20 mg total) by mouth daily at 6 PM.  30 tablet  3  . budesonide-formoterol (SYMBICORT) 160-4.5 MCG/ACT inhaler Inhale 2 puffs into the lungs 2 (two) times daily.      . clopidogrel (PLAVIX) 75 MG tablet Take 1 tablet (75 mg total) by mouth  daily with breakfast.  30 tablet  3  . cyclobenzaprine (FLEXERIL) 10 MG tablet Take 10 mg by mouth 4 (four) times daily as needed for muscle spasms. For muscle spasms      . estrogens, conjugated, (PREMARIN) 0.9 MG tablet Take 0.9 mg by mouth daily.      . famotidine (PEPCID) 20 MG tablet Take 1 tablet (20 mg total) by mouth 2 (two) times daily.  60 tablet  3  . HYDROcodone-homatropine (HYCODAN) 5-1.5 MG/5ML syrup 1-2 tsp po q6h prn cough  120 mL  0  . meloxicam (MOBIC) 7.5 MG tablet Take 7.5 mg by mouth 2 (two) times daily.      . metoprolol succinate (TOPROL-XL) 50 MG 24 hr tablet Take 50 mg by mouth every morning. Take with or immediately following a meal.      . Multiple Vitamins-Minerals (CENTRUM SILVER ADULT 50+ PO) Take 1 tablet by mouth daily.      . nitroGLYCERIN (NITROSTAT) 0.4 MG SL tablet Place 1 tablet (0.4 mg total) under the tongue every 5 (five) minutes x 3 doses as needed for chest pain.  25 tablet  3  . olmesartan (BENICAR) 40 MG tablet Take 1 tablet (40 mg total) by mouth daily.  14 tablet  0  .  oxyCODONE (OXY IR/ROXICODONE) 5 MG immediate release tablet 1-2 tabs po qhs prn pain  30 tablet  0  . potassium chloride SA (K-DUR,KLOR-CON) 20 MEQ tablet 2 tabs po qd x 3d, then 1 tab po qd x 2d, then stop  8 tablet  0  . pregabalin (LYRICA) 300 MG capsule Take 1 capsule (300 mg total) by mouth 2 (two) times daily.  60 capsule  6  . traMADol (ULTRAM) 50 MG tablet 1-2 tabs po q6h prn pain  30 tablet  0  . zolpidem (AMBIEN) 5 MG tablet Take 1 tablet (5 mg total) by mouth at bedtime as needed for sleep.  30 tablet  0  . desvenlafaxine (PRISTIQ) 50 MG 24 hr tablet 1 tab po qd x 4d, then 1 tab po qod x 2 doses, then stop  6 tablet  0  . levofloxacin (LEVAQUIN) 500 MG tablet Take 1 tablet (500 mg total) by mouth daily.  7 tablet  0  . predniSONE (DELTASONE) 20 MG tablet 3 tabs po qd x 5d, then 2 tabs po qd x 5d, then 1 tab po qd x 5d, then 1/2 tab po qd x 4d, then stop.  32 tablet  0   No  facility-administered medications prior to visit.    PE: Blood pressure 84/50, pulse 82, height 5\' 4"  (1.626 m), weight 168 lb (76.204 kg). Gen: Alert, well appearing.  Patient is oriented to person, place, time, and situation. CV: RRR (rate 85-90) LUNGS: CTA bilat, nonlabored resps.  She has occasional pseudowheeze. EXT: 1+ pitting edema bilat, L>R.  IMPRESSION AND PLAN:  HTN (hypertension), benign BP now low. Will decrease benicar to 20mg  qd.   Hold all bp meds until bp back up over 140/90.    Bronchopneumonia Moderate improvement. No further systemic steroids at this time.   Signs/symptoms to call or return for were reviewed and pt expressed understanding.  Fibromyalgia syndrome Her syndrome is characterized by not only diffuse soft tissue aching/tenderness but also chronic HA's and insomnia. Recent addition of oxycodone at bedtime has helped some for HA's at night and helped her get some rest. She takes an occasional dose of this med in daytime. Continue savella 50mg  bid--rx and 2 weeks' worth of samples given today.  Iron deficiency anemia Hematologist in [place], [place] did extensive w/u; no cause found; failed oral supplement; gets fairly regular (q6m or so) IV iron infusions (Venofer/iron sucrose) 200mg  with procrit b/c venofer alone eventually was not helpful.  Hb stable from 08/25/12 to our check here at the end of 09/2012. She also says she got monthly B12 injections.  Vit B12 IM given today. Check CBC with iron studies/ferritin today. Set up iron infusion as appropriate.   An After Visit Summary was printed and given to the patient.   FOLLOW UP: 3-4 wks bp, HAs, fibro

## 2013-03-26 ENCOUNTER — Telehealth: Payer: Self-pay | Admitting: Family Medicine

## 2013-03-26 LAB — FERRITIN: Ferritin: 35.7 ng/mL (ref 10.0–291.0)

## 2013-03-26 NOTE — Telephone Encounter (Signed)
Ok steph?

## 2013-03-27 MED ORDER — OLMESARTAN MEDOXOMIL 40 MG PO TABS: 20.0000 mg | ORAL_TABLET | Freq: Every day | ORAL | Status: DC

## 2013-03-27 NOTE — Assessment & Plan Note (Signed)
BP now low. Will decrease benicar to 20mg  qd.   Hold all bp meds until bp back up over 140/90.

## 2013-03-27 NOTE — Assessment & Plan Note (Signed)
Her syndrome is characterized by not only diffuse soft tissue aching/tenderness but also chronic HA's and insomnia. Recent addition of oxycodone at bedtime has helped some for HA's at night and helped her get some rest. She takes an occasional dose of this med in daytime. Continue savella 50mg  bid--rx and 2 weeks' worth of samples given today.

## 2013-03-27 NOTE — Assessment & Plan Note (Signed)
Moderate improvement. No further systemic steroids at this time.   Signs/symptoms to call or return for were reviewed and pt expressed understanding.

## 2013-03-27 NOTE — Assessment & Plan Note (Signed)
Hematologist in [place], [place] did extensive w/u; no cause found; failed oral supplement; gets fairly regular (q93m or so) IV iron infusions (Venofer/iron sucrose) 200mg  with procrit b/c venofer alone eventually was not helpful.  Hb stable from 08/25/12 to our check here at the end of 09/2012. She also says she got monthly B12 injections.  Vit B12 IM given today. Check CBC with iron studies/ferritin today. Set up iron infusion as appropriate.

## 2013-03-29 NOTE — Telephone Encounter (Signed)
Pt notified appt has been scheduled.

## 2013-03-29 NOTE — Addendum Note (Signed)
Addended by: Luisa Dago on: 03/29/2013 12:21 PM   Modules accepted: Orders

## 2013-04-01 ENCOUNTER — Ambulatory Visit: Payer: Medicare Other | Admitting: Family Medicine

## 2013-04-06 NOTE — Telephone Encounter (Signed)
Patient is requesting Linda [name] to ask Dr. Milinda Cave what the name of the medication is that they are giving her for the infusion. She also wants to know how many hours it is going to take. Please call her back.

## 2013-04-06 NOTE — Telephone Encounter (Signed)
Beth notified that med is Venofer and infusion will take 4-6 hours.  She is appreciative of help and prompt call-back.

## 2013-04-07 NOTE — Progress Notes (Signed)
Pt is scheduled in our department for Venofer infusion and called to reschedule appointment from 04/12/13 to 04/16/13 due to some health issues of husband. She also asked if she had premeds (Decadron and Benadryl) ordered for this infusion because she  " has been getting this drug for 13 years in [place] and has recently moved here and has had some issues with this infusion in the past but does better with it if she has the premeds". She also states she has an appointment with Dr Milinda Cave on 04/15/13. I asked if she has previously discuss the premeds with Dr Milinda Cave and she was unsure if she had or not but would discuss it with him at her appointment on 04/15/13. I called Dr Samul Dada office and left a message about this on Stephanie's voice mail

## 2013-04-12 ENCOUNTER — Inpatient Hospital Stay (HOSPITAL_COMMUNITY): Admission: RE | Admit: 2013-04-12 | Payer: Medicare Other | Source: Ambulatory Visit

## 2013-04-14 ENCOUNTER — Encounter: Payer: Self-pay | Admitting: Family Medicine

## 2013-04-14 ENCOUNTER — Ambulatory Visit (INDEPENDENT_AMBULATORY_CARE_PROVIDER_SITE_OTHER): Payer: Medicare Other | Admitting: Family Medicine

## 2013-04-14 VITALS — BP 132/78 | HR 87 | Temp 98.3°F | Resp 16 | Wt 163.5 lb

## 2013-04-14 DIAGNOSIS — D638 Anemia in other chronic diseases classified elsewhere: Secondary | ICD-10-CM | POA: Diagnosis not present

## 2013-04-14 DIAGNOSIS — D509 Iron deficiency anemia, unspecified: Secondary | ICD-10-CM

## 2013-04-14 DIAGNOSIS — I10 Essential (primary) hypertension: Secondary | ICD-10-CM

## 2013-04-14 MED ORDER — CYCLOBENZAPRINE HCL 10 MG PO TABS: 10.0000 mg | ORAL_TABLET | Freq: Four times a day (QID) | ORAL | Status: DC | PRN

## 2013-04-14 MED ORDER — ALPRAZOLAM 1 MG PO TABS: 1.0000 mg | ORAL_TABLET | Freq: Four times a day (QID) | ORAL | Status: DC

## 2013-04-14 MED ORDER — OXYCODONE HCL 5 MG PO TABS: ORAL_TABLET | ORAL | Status: DC

## 2013-04-14 MED ORDER — HYDROCHLOROTHIAZIDE 25 MG PO TABS: 25.0000 mg | ORAL_TABLET | Freq: Every day | ORAL | Status: DC

## 2013-04-14 MED ORDER — ZOLPIDEM TARTRATE 10 MG PO TABS: 10.0000 mg | ORAL_TABLET | Freq: Every evening | ORAL | Status: DC | PRN

## 2013-04-14 MED ORDER — METOPROLOL SUCCINATE ER 100 MG PO TB24: 100.0000 mg | ORAL_TABLET | Freq: Every day | ORAL | Status: DC

## 2013-04-14 NOTE — Assessment & Plan Note (Signed)
Hematologist in [place], [place] did extensive w/u; no cause found; failed oral supplement; gets fairly regular (q87m or so) IV iron infusions (Venofer/iron sucrose) 200mg  with procrit for anemia of chronic disease (b/c venofer alone was not helpful).  Currently scheduled to get Venofer in 2d.  Will need to do new order to include procrit for anemia of chronic dz PLUS pt reports need for pretreatment with decadron and benadryl due to hx of adverse reaction from the venofer infusion.

## 2013-04-14 NOTE — Assessment & Plan Note (Signed)
Check renal artery dopplers. Increase Toprol XL to 100mg  qd.  Continue benicar 40 mg qd. Add HCTZ 25mg  qd in 2d.   Continue home bp monitoring.

## 2013-04-14 NOTE — Patient Instructions (Signed)
Start 100mg  toprol daily--start now. In 2 days, start your HCTZ ("water pill"). Continue to monitor bp like you are doing.

## 2013-04-14 NOTE — Progress Notes (Signed)
OFFICE NOTE  04/14/2013  CC:  Chief Complaint  Patient presents with  . Follow-up    HTN; Bronchitis     HPI: Patient is a 67 y.o. Caucasian female who is here for  3 wk f/u HTN, recent LRTI w/RAD. Says chest feels good, breathing back to normal.  Still with dry cough, which she admits she had before this recent illness. No fevers.   Says legs throb from top to bottom: like one big bruise.  She says this has often been the way she feels when she needs her iron infusion and it would usually resolve a few days after her infusion.   She is set to have iron sucrose infusion later this week.  She tells me that if she doesn't have a dose of decadron and benadryl prior to her infusion then she gets faint and SOB.    Home bp's: all high (some systolics >200 and diastolics >110).  HR 80s-90s.  She took a dose of lisinopril last night on top of the benicar 40mg  and Toprol XL 50mg  qd she already takes.  Denies HA, focal weakness, chest pain, palpitations, or SOB.  Very stressed at home with taking care of husband with mesothelioma.  Pertinent PMH:  HTN  Dep/Anx  Fibromyalgia  Asthma  Chronic iron def anemia  Anemia of chronic disease Insomnia  Chronic HA syndrome  Hyperlipidemia  Past Surgical History  Procedure Laterality Date  . Appendectomy  1960  . Total abdominal hysterectomy  1974  . Tendon release  1996    Right forearm and hand  . Knee surgery  2005  . Heel spur surgery  2008    left foot  . Plantar fascia release  2008    left  . Axillary surgery  1978    Multiple "lump" in armpit per pt  . Coccyx removal  1972  . Cardiac catheterization  01/2013    nonobstructive CAD, EF 55-60%  . Transthoracic echocardiogram  01/2013    NORMAL    MEDS:  Outpatient Prescriptions Prior to Visit  Medication Sig Dispense Refill  . albuterol (PROVENTIL HFA;VENTOLIN HFA) 108 (90 BASE) MCG/ACT inhaler Inhale 2 puffs into the lungs every 6 (six) hours as needed for wheezing. For  shortness of breath      . ALPRAZolam (XANAX) 1 MG tablet Take 1 mg by mouth 4 (four) times daily.      [name] Kitchen amitriptyline (ELAVIL) 100 MG tablet Take 100 mg by mouth daily.      [name] Kitchen aspirin EC 81 MG EC tablet Take 1 tablet (81 mg total) by mouth daily.  30 tablet  3  . atorvastatin (LIPITOR) 20 MG tablet Take 1 tablet (20 mg total) by mouth daily at 6 PM.  30 tablet  3  . budesonide-formoterol (SYMBICORT) 160-4.5 MCG/ACT inhaler Inhale 2 puffs into the lungs 2 (two) times daily.      . clopidogrel (PLAVIX) 75 MG tablet Take 1 tablet (75 mg total) by mouth daily with breakfast.  30 tablet  3  . cyclobenzaprine (FLEXERIL) 10 MG tablet Take 10 mg by mouth 4 (four) times daily as needed for muscle spasms. For muscle spasms      . estrogens, conjugated, (PREMARIN) 0.9 MG tablet Take 0.9 mg by mouth daily.      . famotidine (PEPCID) 20 MG tablet Take 1 tablet (20 mg total) by mouth 2 (two) times daily.  60 tablet  3  . meloxicam (MOBIC) 7.5 MG tablet Take 7.5 mg by  mouth 2 (two) times daily.      . metoprolol succinate (TOPROL-XL) 50 MG 24 hr tablet Take 50 mg by mouth every morning. Take with or immediately following a meal.      . Milnacipran (SAVELLA) 50 MG TABS Take 1 tablet (50 mg total) by mouth 2 (two) times daily.  60 tablet  3  . Multiple Vitamins-Minerals (CENTRUM SILVER ADULT 50+ PO) Take 1 tablet by mouth daily.      . nitroGLYCERIN (NITROSTAT) 0.4 MG SL tablet Place 1 tablet (0.4 mg total) under the tongue every 5 (five) minutes x 3 doses as needed for chest pain.  25 tablet  3  . olmesartan (BENICAR) 40 MG tablet Take 0.5 tablets (20 mg total) by mouth daily.  14 tablet  0  . oxyCODONE (OXY IR/ROXICODONE) 5 MG immediate release tablet 1-2 tabs po qhs prn pain  30 tablet  0  . potassium chloride SA (K-DUR,KLOR-CON) 20 MEQ tablet 2 tabs po qd x 3d, then 1 tab po qd x 2d, then stop  8 tablet  0  . pregabalin (LYRICA) 300 MG capsule Take 1 capsule (300 mg total) by mouth 2 (two) times daily.  60  capsule  6  . traMADol (ULTRAM) 50 MG tablet 1-2 tabs po q6h prn pain  30 tablet  0  . zolpidem (AMBIEN) 5 MG tablet Take 1 tablet (5 mg total) by mouth at bedtime as needed for sleep.  30 tablet  0  . HYDROcodone-homatropine (HYCODAN) 5-1.5 MG/5ML syrup 1-2 tsp po q6h prn cough  120 mL  0   No facility-administered medications prior to visit.    PE: Blood pressure 132/78, pulse 87, temperature 98.3 F (36.8 C), temperature source Oral, resp. rate 16, weight 163 lb 8 oz (74.163 kg), SpO2 96.00%. Gen: Alert, well appearing.  Patient is oriented to person, place, time, and situation. Neck - No masses or thyromegaly or limitation in range of motion CV: RRR, no m/r/g.   LUNGS: CTA bilat, nonlabored resps, good aeration in all lung fields. EXT: no clubbing, cyanosis, or edema.  Neuro: no focal deficits  LAB:  Lab Results  Component Value Date   WBC 6.0 03/25/2013   HGB 10.5* 03/25/2013   HCT 31.5* 03/25/2013   MCV 89.0 03/25/2013   PLT 260.0 03/25/2013   Lab Results  Component Value Date   IRON 41* 03/25/2013   FERRITIN 35.7 03/25/2013     Chemistry      Component Value Date/Time   NA 135 03/25/2013 1526   K 4.1 03/25/2013 1526   CL 102 03/25/2013 1526   CO2 26 03/25/2013 1526   BUN 27* 03/25/2013 1526   CREATININE 0.9 03/25/2013 1526   CREATININE 0.84 03/15/2013 1414      Component Value Date/Time   CALCIUM 8.6 03/25/2013 1526   ALKPHOS 56 03/15/2013 1414   AST 21 03/15/2013 1414   ALT 17 03/15/2013 1414   BILITOT 0.4 03/15/2013 1414       IMPRESSION AND PLAN:  Uncontrolled hypertension Check renal artery dopplers. Increase Toprol XL to 100mg  qd.  Continue benicar 40 mg qd. Add HCTZ 25mg  qd in 2d.   Continue home bp monitoring.  Iron deficiency anemia Hematologist in [place], [place] did extensive w/u; no cause found; failed oral supplement; gets fairly regular (q16m or so) IV iron infusions (Venofer/iron sucrose) 200mg  with procrit for anemia of chronic disease (b/c venofer alone was not  helpful).  Currently scheduled to get Venofer in 2d.  Will need to do new order to include procrit for anemia of chronic dz PLUS pt reports need for pretreatment with decadron and benadryl due to hx of adverse reaction from the venofer infusion.   Recent LRTI with RAD--resolved.  An After Visit Summary was printed and given to the patient.  FOLLOW UP: 10d

## 2013-04-15 ENCOUNTER — Ambulatory Visit: Payer: Medicare Other | Admitting: Family Medicine

## 2013-04-15 ENCOUNTER — Other Ambulatory Visit: Payer: Self-pay | Admitting: *Deleted

## 2013-04-15 DIAGNOSIS — D509 Iron deficiency anemia, unspecified: Secondary | ICD-10-CM

## 2013-04-15 DIAGNOSIS — D638 Anemia in other chronic diseases classified elsewhere: Secondary | ICD-10-CM

## 2013-04-15 MED ORDER — DIPHENHYDRAMINE HCL 50 MG/ML IJ SOLN: 50.0000 mg | Freq: Once | INTRAMUSCULAR | Status: DC

## 2013-04-15 MED ORDER — DEXAMETHASONE SODIUM PHOSPHATE 20 MG/5ML IJ SOLN: 20.0000 mg | Freq: Once | INTRAMUSCULAR | Status: DC

## 2013-04-16 ENCOUNTER — Encounter (HOSPITAL_COMMUNITY)
Admission: RE | Admit: 2013-04-16 | Discharge: 2013-04-16 | Disposition: A | Discharge status: Home / Self Care | Payer: Medicare Other | Source: Ambulatory Visit | Attending: Family Medicine | Admitting: Family Medicine

## 2013-04-16 VITALS — BP 121/63 | HR 77 | Temp 97.0°F | Resp 16 | Ht 64.0 in | Wt 155.0 lb

## 2013-04-16 DIAGNOSIS — D638 Anemia in other chronic diseases classified elsewhere: Secondary | ICD-10-CM

## 2013-04-16 DIAGNOSIS — D509 Iron deficiency anemia, unspecified: Secondary | ICD-10-CM | POA: Diagnosis not present

## 2013-04-16 MED ORDER — SODIUM CHLORIDE 0.9 % IV SOLN
Freq: Once | INTRAVENOUS | Status: AC
  Administered 2013-04-16: 08:00:00 via INTRAVENOUS

## 2013-04-16 MED ORDER — PROMETHAZINE HCL 25 MG/ML IJ SOLN
12.5000 mg | Freq: Four times a day (QID) | INTRAMUSCULAR | Status: AC | PRN
  Administered 2013-04-16: 12.5 mg via INTRAVENOUS
  Filled 2013-04-16 (×2): qty 1

## 2013-04-16 MED ORDER — IRON SUCROSE 20 MG/ML IV SOLN
200.0000 mg | Freq: Once | INTRAVENOUS | Status: AC
  Administered 2013-04-16: 200 mg via INTRAVENOUS
  Filled 2013-04-16: qty 10

## 2013-04-16 MED ORDER — DIPHENHYDRAMINE HCL 50 MG/ML IJ SOLN
50.0000 mg | Freq: Once | INTRAMUSCULAR | Status: AC
  Administered 2013-04-16: 50 mg via INTRAVENOUS
  Filled 2013-04-16: qty 1

## 2013-04-16 MED ORDER — DEXAMETHASONE SODIUM PHOSPHATE 10 MG/ML IJ SOLN
20.0000 mg | Freq: Once | INTRAMUSCULAR | Status: AC
  Administered 2013-04-16: 20 mg via INTRAVENOUS
  Filled 2013-04-16 (×2): qty 1

## 2013-04-16 NOTE — Progress Notes (Signed)
Pt requesting something for nausea. Called Dr Samul Dada office and left message requesting this. Awaiting orders

## 2013-04-16 NOTE — Progress Notes (Signed)
Pt here for Venofer infusion. Premeds were ordered and given and 200mg  of Venofer was also ordered to be infused. Pharmacy states it is to infuse over 1 hours. Patient questions this and I again confirm with pharmacy that this is the correct infusion time. Pt states" Dr Milinda Cave said I would be here all day" and wants him called to confirm the dosage and" whether it will take all day". I spoke with Dr Milinda Cave and he confirmed 200mg  Venofer infusion at the infusion time at [facility] discretion. I discussed this with patient and it was decided to infuse over 2 hours

## 2013-04-16 NOTE — Progress Notes (Signed)
After infusion of Venofer. Pt states nausea has subsided. Patient is alert but drowsy. Ambulated patient in room and is walking very"stifflegged' When questioned about this Patient and husband both state" after oneof these infusions (VENOFER) my joints lock up and stay that way for 2-3 days. It has happened like this for the last 10 years". Patient wants to go home and ambulated to w/c for discharge. No other c/o

## 2013-04-16 NOTE — Progress Notes (Signed)
Orders received for antiemetic and given to patient as ordered

## 2013-04-22 ENCOUNTER — Ambulatory Visit (INDEPENDENT_AMBULATORY_CARE_PROVIDER_SITE_OTHER): Payer: Medicare Other | Admitting: Family Medicine

## 2013-04-22 ENCOUNTER — Encounter: Payer: Self-pay | Admitting: Family Medicine

## 2013-04-22 VITALS — BP 119/73 | HR 83 | Temp 97.9°F | Resp 16 | Wt 164.5 lb

## 2013-04-22 DIAGNOSIS — Z79899 Other long term (current) drug therapy: Secondary | ICD-10-CM

## 2013-04-22 DIAGNOSIS — H04129 Dry eye syndrome of unspecified lacrimal gland: Secondary | ICD-10-CM | POA: Diagnosis not present

## 2013-04-22 MED ORDER — ZOLPIDEM TARTRATE 5 MG PO TABS: 5.0000 mg | ORAL_TABLET | Freq: Every evening | ORAL | Status: DC | PRN

## 2013-04-22 MED ORDER — OLMESARTAN MEDOXOMIL 40 MG PO TABS: 20.0000 mg | ORAL_TABLET | Freq: Every day | ORAL | Status: DC

## 2013-04-22 MED ORDER — ALPRAZOLAM 1 MG PO TABS: ORAL_TABLET | ORAL | Status: DC

## 2013-04-22 MED ORDER — AMITRIPTYLINE HCL 100 MG PO TABS: 50.0000 mg | ORAL_TABLET | Freq: Every day | ORAL | Status: DC

## 2013-04-22 NOTE — Patient Instructions (Signed)
Patient should no longer take any oxycodone, flexeril, or tramadol (ultram). Decrease xanax to 1/2 of a 1mg  tab three times per day. Decrease zolpidem to 1/2 of a 10mg  tab at bedtime. Decrease amitriptyline to 1/2 of 100mg  tab at bedtime.  I sent in rx for benicar: take 1/2 of 40mg  tab once every day.  If home bp is still constistently > 140/90 then increase this med to 1 whole tab once daily.

## 2013-04-22 NOTE — Progress Notes (Signed)
OFFICE NOTE  04/26/2013  CC:  Chief Complaint  Patient presents with  . Follow-up    Anemia; HTN; Sleeping Issues     HPI: Patient is a 67 y.o. Caucasian female who is here for f/u visit--med monitoring.  Her son Maisie Fus is here with her today. He and the rest of the family are concerned b/c apparently Mrs. Fodera has a long (years) hx of overusing her sedative medications. She has periods of months in which she seems to be in control and has no issues, and then has long periods of going through days oversedated and occasionally the family has found her completely unconscious.  In fact, her recent hospitalization in which she had prolonged hypotension requiring pressors for bp support could have been a direct effect of her med misuse/overuse.  We reviewed her med list and her box of meds from home. She says she no longer takes any ultram.  She has a large bottle of flexeril that is full and she says she doesn't take it very frequently.  She has some oxycodone that she says she takes a couple of times a day lately.  Pertinent PMH:  HTN Fibromyalgia Hyperlipidemia Chronic HA syndrome Depression/anxiety Insomnia Postmenopausal syndrome Iron def anemia  Past Surgical History  Procedure Laterality Date  . Appendectomy  1960  . Total abdominal hysterectomy  1974  . Tendon release  1996    Right forearm and hand  . Knee surgery  2005  . Heel spur surgery  2008    left foot  . Plantar fascia release  2008    left  . Axillary surgery  1978    Multiple "lump" in armpit per pt  . Coccyx removal  1972  . Cardiac catheterization  01/2013    nonobstructive CAD, EF 55-60%  . Transthoracic echocardiogram  01/2013    NORMAL    MEDS:  Outpatient Prescriptions Prior to Visit  Medication Sig Dispense Refill  . albuterol (PROVENTIL HFA;VENTOLIN HFA) 108 (90 BASE) MCG/ACT inhaler Inhale 2 puffs into the lungs every 6 (six) hours as needed for wheezing. For shortness of breath      .  aspirin EC 81 MG EC tablet Take 1 tablet (81 mg total) by mouth daily.  30 tablet  3  . atorvastatin (LIPITOR) 20 MG tablet Take 1 tablet (20 mg total) by mouth daily at 6 PM.  30 tablet  3  . budesonide-formoterol (SYMBICORT) 160-4.5 MCG/ACT inhaler Inhale 2 puffs into the lungs 2 (two) times daily.      . clopidogrel (PLAVIX) 75 MG tablet Take 1 tablet (75 mg total) by mouth daily with breakfast.  30 tablet  3  . estrogens, conjugated, (PREMARIN) 0.9 MG tablet Take 0.9 mg by mouth daily.      . famotidine (PEPCID) 20 MG tablet Take 1 tablet (20 mg total) by mouth 2 (two) times daily.  60 tablet  3  . hydrochlorothiazide (HYDRODIURIL) 25 MG tablet Take 1 tablet (25 mg total) by mouth daily.  90 tablet  3  . meloxicam (MOBIC) 7.5 MG tablet Take 7.5 mg by mouth 2 (two) times daily.      . metoprolol succinate (TOPROL-XL) 100 MG 24 hr tablet Take 1 tablet (100 mg total) by mouth daily. Take with or immediately following a meal.  90 tablet  3  . Milnacipran (SAVELLA) 50 MG TABS Take 1 tablet (50 mg total) by mouth 2 (two) times daily.  60 tablet  3  . Multiple Vitamins-Minerals (CENTRUM  SILVER ADULT 50+ PO) Take 1 tablet by mouth daily.      . nitroGLYCERIN (NITROSTAT) 0.4 MG SL tablet Place 1 tablet (0.4 mg total) under the tongue every 5 (five) minutes x 3 doses as needed for chest pain.  25 tablet  3  . potassium chloride SA (K-DUR,KLOR-CON) 20 MEQ tablet 2 tabs po qd x 3d, then 1 tab po qd x 2d, then stop  8 tablet  0  . pregabalin (LYRICA) 300 MG capsule Take 1 capsule (300 mg total) by mouth 2 (two) times daily.  60 capsule  6  . ALPRAZolam (XANAX) 1 MG tablet Take 1 tablet (1 mg total) by mouth 4 (four) times daily.  360 tablet  0  . amitriptyline (ELAVIL) 100 MG tablet Take 100 mg by mouth daily.      . cyclobenzaprine (FLEXERIL) 10 MG tablet Take 1 tablet (10 mg total) by mouth 4 (four) times daily as needed for muscle spasms. For muscle spasms  270 tablet  0  . olmesartan (BENICAR) 40 MG  tablet Take 0.5 tablets (20 mg total) by mouth daily.  14 tablet  0  . oxyCODONE (OXY IR/ROXICODONE) 5 MG immediate release tablet 1-2 tabs po q6h prn  60 tablet  0  . traMADol (ULTRAM) 50 MG tablet 1-2 tabs po q6h prn pain  30 tablet  0  . zolpidem (AMBIEN) 10 MG tablet Take 1 tablet (10 mg total) by mouth at bedtime as needed for sleep.  90 tablet  0   No facility-administered medications prior to visit.    PE: Blood pressure 119/73, pulse 83, temperature 97.9 F (36.6 C), temperature source Oral, resp. rate 16, weight 164 lb 8 oz (74.617 kg), SpO2 95.00%. Gen: Alert, well appearing.  Patient is oriented to person, place, time, and situation. Affect: a bit morose but conversational. No further exam today.  IMPRESSION AND PLAN:  Polypharmacy High risk for overuse of sedative/narcotic/benzo-type meds.  We did discuss the possibility of an inpatient drug rehab stay but she was not ready for this. Will gradually taper-back some of her meds: start with decreasing xanax to 1/2 tab three times a day, decrease amitriptyline from 100 to 50mg  qhs, and drop zolpidem down from 10mg  to 5mg  qhs. Recheck in 1 wk. I recommended she completely d/c oxycodone, flexeril, and tramadol.  Spent 25 min with pt today, with >50% of this time spent in counseling and care coordination regarding the above problems.  FOLLOW UP:  1 wk

## 2013-04-23 ENCOUNTER — Other Ambulatory Visit (HOSPITAL_COMMUNITY): Payer: Self-pay | Admitting: Family Medicine

## 2013-04-23 ENCOUNTER — Ambulatory Visit (HOSPITAL_COMMUNITY)
Admission: RE | Admit: 2013-04-23 | Discharge: 2013-04-23 | Disposition: A | Discharge status: Home / Self Care | Payer: Medicare Other | Source: Ambulatory Visit | Attending: Family Medicine | Admitting: Family Medicine

## 2013-04-23 DIAGNOSIS — I10 Essential (primary) hypertension: Secondary | ICD-10-CM

## 2013-04-23 NOTE — Progress Notes (Signed)
*  PRELIMINARY RESULTS* Vascular Ultrasound Renal Artery Duplex has been completed.   There is no obvious evidence of hemodynamically significant renal artery stenosis bilaterally.  04/23/2013 12:36 PM Gertie Fey, RDMS, RDCS

## 2013-04-24 ENCOUNTER — Other Ambulatory Visit (HOSPITAL_COMMUNITY): Payer: Self-pay | Admitting: Family Medicine

## 2013-04-26 ENCOUNTER — Ambulatory Visit: Payer: Medicare Other | Admitting: Family Medicine

## 2013-04-26 DIAGNOSIS — Z79899 Other long term (current) drug therapy: Secondary | ICD-10-CM | POA: Insufficient documentation

## 2013-04-26 NOTE — Telephone Encounter (Signed)
Refill request for Xanax Last filled-04/22/13, #[id number] x0 Last seen-04/22/13 Follow up -04/28/13

## 2013-04-26 NOTE — Assessment & Plan Note (Addendum)
High risk for overuse of sedative/narcotic/benzo-type meds.  We did discuss the possibility of an inpatient drug rehab stay but she was not ready for this. Will gradually taper-back some of her meds: start with decreasing xanax to 1/2 tab three times a day, decrease amitriptyline from 100 to 50mg  qhs, and drop zolpidem down from 10mg  to 5mg  qhs. Recheck in 1 wk. I recommended she completely d/c oxycodone, flexeril, and tramadol.

## 2013-04-28 ENCOUNTER — Encounter: Payer: Self-pay | Admitting: Family Medicine

## 2013-04-28 ENCOUNTER — Ambulatory Visit (INDEPENDENT_AMBULATORY_CARE_PROVIDER_SITE_OTHER): Payer: Medicare Other | Admitting: Family Medicine

## 2013-04-28 VITALS — BP 112/68 | HR 87 | Temp 98.3°F | Resp 18 | Wt 169.0 lb

## 2013-04-28 DIAGNOSIS — Z79899 Other long term (current) drug therapy: Secondary | ICD-10-CM

## 2013-04-28 NOTE — Assessment & Plan Note (Signed)
She's handling the med changes we made last week as expected--she is frustrated but is willing to continue to adhere to this regimen. She declined referral to a psychiatrist today, also declined referral to a counselor. I encouraged her to find someone in her church or family that she can express her feelings/thoughts to--"vent" --regarding her husband's terminal illness and her frustration with her family. She expressed understanding of this idea but just said "we've got people at church praying for [place]" and she did not intend to get counseling at church. I told pt that I would not go any higher on the dosing of her current psych meds, but I also have no further plans at this time to decrease the dose or frequency of them. We will avoid narcotic pain meds and muscle relaxers in the future.

## 2013-04-28 NOTE — Progress Notes (Signed)
OFFICE NOTE  04/28/2013  CC:  Chief Complaint  Patient presents with  . Follow-up    Medication Management     HPI: Patient is a 67 y.o. Caucasian female who is here for 1 week f/u polypharmacy and problem with overuse of her sedative meds. She is more anxious and her insomnia is worse since the changes we made, but she seems to be dealing with it fairly well.  Her son is monitoring/managing her med administration for now--she is not happy with this, says she feels like she is being treated like "a 67 year old who can't do anything for herself". No new complaints.  Pertinent PMH:  Anxiety Insomnia Fibromyalgia Asthma HTN GERD Polypharmacy Hx of overuse of her sedative meds. MEDS:  Outpatient Prescriptions Prior to Visit  Medication Sig Dispense Refill  . albuterol (PROVENTIL HFA;VENTOLIN HFA) 108 (90 BASE) MCG/ACT inhaler Inhale 2 puffs into the lungs every 6 (six) hours as needed for wheezing. For shortness of breath      . ALPRAZolam (XANAX) 1 MG tablet 1/2 tab po tid  360 tablet  0  . amitriptyline (ELAVIL) 100 MG tablet Take 0.5 tablets (50 mg total) by mouth daily.  15 tablet  0  . aspirin EC 81 MG EC tablet Take 1 tablet (81 mg total) by mouth daily.  30 tablet  3  . atorvastatin (LIPITOR) 20 MG tablet Take 1 tablet (20 mg total) by mouth daily at 6 PM.  30 tablet  3  . budesonide-formoterol (SYMBICORT) 160-4.5 MCG/ACT inhaler Inhale 2 puffs into the lungs 2 (two) times daily.      . clopidogrel (PLAVIX) 75 MG tablet Take 1 tablet (75 mg total) by mouth daily with breakfast.  30 tablet  3  . estrogens, conjugated, (PREMARIN) 0.9 MG tablet Take 0.9 mg by mouth daily.      . hydrochlorothiazide (HYDRODIURIL) 25 MG tablet Take 1 tablet (25 mg total) by mouth daily.  90 tablet  3  . meloxicam (MOBIC) 7.5 MG tablet Take 7.5 mg by mouth 2 (two) times daily.      . metoprolol succinate (TOPROL-XL) 100 MG 24 hr tablet Take 1 tablet (100 mg total) by mouth daily. Take with or  immediately following a meal.  90 tablet  3  . Milnacipran (SAVELLA) 50 MG TABS Take 1 tablet (50 mg total) by mouth 2 (two) times daily.  60 tablet  3  . Multiple Vitamins-Minerals (CENTRUM SILVER ADULT 50+ PO) Take 1 tablet by mouth daily.      . nitroGLYCERIN (NITROSTAT) 0.4 MG SL tablet Place 1 tablet (0.4 mg total) under the tongue every 5 (five) minutes x 3 doses as needed for chest pain.  25 tablet  3  . olmesartan (BENICAR) 40 MG tablet Take 0.5 tablets (20 mg total) by mouth daily.  15 tablet  0  . potassium chloride SA (K-DUR,KLOR-CON) 20 MEQ tablet 2 tabs po qd x 3d, then 1 tab po qd x 2d, then stop  8 tablet  0  . pregabalin (LYRICA) 300 MG capsule Take 1 capsule (300 mg total) by mouth 2 (two) times daily.  60 capsule  6  . zolpidem (AMBIEN) 5 MG tablet Take 1 tablet (5 mg total) by mouth at bedtime as needed for sleep.  30 tablet  0  . famotidine (PEPCID) 20 MG tablet Take 1 tablet (20 mg total) by mouth 2 (two) times daily.  60 tablet  3   No facility-administered medications prior to visit.  PE: Blood pressure 112/68, pulse 87, temperature 98.3 F (36.8 C), temperature source Oral, resp. rate 18, weight 169 lb (76.658 kg), SpO2 94.00%. Gen: Alert, well appearing.  Patient is oriented to person, place, time, and situation. Affect: stone-faced, made eye contact less than her usual.  No crying/sadness but no pleasant demeanor either. No further exam today.  IMPRESSION AND PLAN:  Polypharmacy She's handling the med changes we made last week as expected--she is frustrated but is willing to continue to adhere to this regimen. She declined referral to a psychiatrist today, also declined referral to a counselor. I encouraged her to find someone in her church or family that she can express her feelings/thoughts to--"vent" --regarding her husband's terminal illness and her frustration with her family. She expressed understanding of this idea but just said "we've got people at church  praying for [place]" and she did not intend to get counseling at church. I told pt that I would not go any higher on the dosing of her current psych meds, but I also have no further plans at this time to decrease the dose or frequency of them. We will avoid narcotic pain meds and muscle relaxers in the future.   Regarding her insomnia, I reviewed good sleep hygiene practices with her in detail today and encouraged rigid adherence to a routine at night the she could given her circumstances.  I also recommended she take no OTC sleep aids EXCEPT she could do a trial of melatonin.  An After Visit Summary was printed and given to the patient.   FOLLOW UP: 28mo

## 2013-05-27 ENCOUNTER — Encounter: Payer: Self-pay | Admitting: Family Medicine

## 2013-05-27 ENCOUNTER — Ambulatory Visit (INDEPENDENT_AMBULATORY_CARE_PROVIDER_SITE_OTHER): Payer: Medicare Other | Admitting: Family Medicine

## 2013-05-27 VITALS — BP 149/84 | HR 87 | Temp 98.1°F | Resp 14 | Wt 173.5 lb

## 2013-05-27 DIAGNOSIS — R600 Localized edema: Secondary | ICD-10-CM

## 2013-05-27 DIAGNOSIS — I10 Essential (primary) hypertension: Secondary | ICD-10-CM

## 2013-05-27 DIAGNOSIS — R609 Edema, unspecified: Secondary | ICD-10-CM

## 2013-05-27 DIAGNOSIS — Z862 Personal history of diseases of the blood and blood-forming organs and certain disorders involving the immune mechanism: Secondary | ICD-10-CM | POA: Diagnosis not present

## 2013-05-27 DIAGNOSIS — Z79899 Other long term (current) drug therapy: Secondary | ICD-10-CM

## 2013-05-27 LAB — IRON: Iron: 44 ug/dL (ref 42–145)

## 2013-05-27 LAB — COMPREHENSIVE METABOLIC PANEL
Albumin: 3.6 g/dL (ref 3.5–5.2)
BUN: 15 mg/dL (ref 6–23)
Calcium: 9.2 mg/dL (ref 8.4–10.5)
Chloride: 105 mEq/L (ref 96–112)
Creat: 0.65 mg/dL (ref 0.50–1.10)
Glucose, Bld: 87 mg/dL (ref 70–99)
Potassium: 3.9 mEq/L (ref 3.5–5.3)

## 2013-05-27 LAB — IBC PANEL
TIBC: 419 ug/dL (ref 250–470)
UIBC: 375 ug/dL (ref 125–400)

## 2013-05-27 LAB — CBC WITH DIFFERENTIAL/PLATELET
Basophils Relative: 0 % (ref 0–1)
Hemoglobin: 11.1 g/dL — ABNORMAL LOW (ref 12.0–15.0)
Lymphocytes Relative: 31 % (ref 12–46)
Lymphs Abs: 2.2 10*3/uL (ref 0.7–4.0)
Monocytes Relative: 8 % (ref 3–12)
Neutro Abs: 4.3 10*3/uL (ref 1.7–7.7)
Neutrophils Relative %: 60 % (ref 43–77)
RBC: 4.09 MIL/uL (ref 3.87–5.11)
WBC: 7.1 10*3/uL (ref 4.0–10.5)

## 2013-05-27 LAB — FERRITIN: Ferritin: 90 ng/mL (ref 10–291)

## 2013-05-27 MED ORDER — METOPROLOL SUCCINATE ER 50 MG PO TB24: ORAL_TABLET | ORAL | Status: DC

## 2013-05-27 MED ORDER — ZOLPIDEM TARTRATE 10 MG PO TABS: 10.0000 mg | ORAL_TABLET | Freq: Every evening | ORAL | Status: AC | PRN

## 2013-05-27 NOTE — Progress Notes (Signed)
OFFICE NOTE  05/27/2013  CC:  Chief Complaint  Patient presents with  . Follow-up    1-mth [Polypharmacy]  . Hypertension    Pt reports blood pressure has continuously been elevated at home readings.     HPI: Patient is a 67 y.o. Caucasian female who is here for 1 mo f/u insomnia, anxiety/depression, HTN. Pt reports home bps 160/90 avg, even some bps up to 218/110.  She feels a bit more swelling in LE's lately.  She is tired of having her son in charge of her meds and states she has taken them back, has even changed her xanax dosing back to a whole tab tid and her ambien back to 10mg  qhs. Since doing so she reports having better sleep.  She expresses concern that the infusion of iron done recently is not enough---reiterates that her hematologist in [facility] routinely did 3 consecutive infusions with epogen as well.  Pertinent PMH:  HTN Anxiety/depression Insomnia Asthma Hx of iron def anemia/iron malabsorption Hyperlipidemia Chronic fatigue/myalgias  Past surgical, social, and family history reviewed and no changes noted since last office visit.  MEDS: **Pt actually taking a whole benicar 40mg  qd with 100mg  Toprol XL and 25mg  HCTZ Outpatient Prescriptions Prior to Visit  Medication Sig Dispense Refill  . albuterol (PROVENTIL HFA;VENTOLIN HFA) 108 (90 BASE) MCG/ACT inhaler Inhale 2 puffs into the lungs every 6 (six) hours as needed for wheezing. For shortness of breath      . ALPRAZolam (XANAX) 1 MG tablet 1/2 tab po tid  360 tablet  0  . amitriptyline (ELAVIL) 100 MG tablet Take 0.5 tablets (50 mg total) by mouth daily.  15 tablet  0  . aspirin EC 81 MG EC tablet Take 1 tablet (81 mg total) by mouth daily.  30 tablet  3  . atorvastatin (LIPITOR) 20 MG tablet Take 1 tablet (20 mg total) by mouth daily at 6 PM.  30 tablet  3  . budesonide-formoterol (SYMBICORT) 160-4.5 MCG/ACT inhaler Inhale 2 puffs into the lungs 2 (two) times daily.      . clopidogrel (PLAVIX) 75 MG tablet Take 1  tablet (75 mg total) by mouth daily with breakfast.  30 tablet  3  . estrogens, conjugated, (PREMARIN) 0.9 MG tablet Take 0.9 mg by mouth daily.      . hydrochlorothiazide (HYDRODIURIL) 25 MG tablet Take 1 tablet (25 mg total) by mouth daily.  90 tablet  3  . meloxicam (MOBIC) 7.5 MG tablet Take 7.5 mg by mouth 2 (two) times daily.      . metoprolol succinate (TOPROL-XL) 100 MG 24 hr tablet Take 1 tablet (100 mg total) by mouth daily. Take with or immediately following a meal.  90 tablet  3  . Milnacipran (SAVELLA) 50 MG TABS Take 1 tablet (50 mg total) by mouth 2 (two) times daily.  60 tablet  3  . Multiple Vitamins-Minerals (CENTRUM SILVER ADULT 50+ PO) Take 1 tablet by mouth daily.      . nitroGLYCERIN (NITROSTAT) 0.4 MG SL tablet Place 1 tablet (0.4 mg total) under the tongue every 5 (five) minutes x 3 doses as needed for chest pain.  25 tablet  3  . olmesartan (BENICAR) 40 MG tablet Take 0.5 tablets (20 mg total) by mouth daily.  15 tablet  0  . potassium chloride SA (K-DUR,KLOR-CON) 20 MEQ tablet 2 tabs po qd x 3d, then 1 tab po qd x 2d, then stop  8 tablet  0  . pregabalin (LYRICA) 300  MG capsule Take 1 capsule (300 mg total) by mouth 2 (two) times daily.  60 capsule  6  . zolpidem (AMBIEN) 5 MG tablet Take 1 tablet (5 mg total) by mouth at bedtime as needed for sleep.  30 tablet  0   No facility-administered medications prior to visit.    PE: Blood pressure 149/84, pulse 87, temperature 98.1 F (36.7 C), temperature source Oral, resp. rate 14, weight 173 lb 8 oz (78.699 kg), SpO2 94.00%. Gen: Alert, well appearing.  Patient is oriented to person, place, time, and situation. EXT: 1+ bilat LE pitting edema (Right calf circ 39 cm, Left calf circ 39 cm).  No rash.    LABS: (s/p iron infusion) Lab Results  Component Value Date   WBC 7.1 05/27/2013   HGB 11.1* 05/27/2013   HCT 33.4* 05/27/2013   MCV 81.7 05/27/2013   PLT 244 05/27/2013   Lab Results  Component Value Date   IRON 44 05/27/2013    TIBC 419 05/27/2013   FERRITIN 90 05/27/2013    IMPRESSION AND PLAN:  Uncontrolled hypertension Increase toprol XL to 150mg  qd x 3d, then increase to 200mg  qd if bp not at goal at that time.   Continue benicar 40mg  qd and hctz 25mg  qd.    Polypharmacy Pt's self management of her xanax and zolpidem have been suspicious in the past. We compromised today: she'll remain on 1/2 tab xanax tid but may continue to give full 10mg  zolpidem qhs.   Peripheral edema Suspect venous insufficiency. Check CMET and UA today.    History of iron deficiency anemia Hematologist in [place], [place] did extensive w/u; no cause found; failed oral supplement; had been getting fairly regular (q31m or so) IV iron infusions (Venofer/iron sucrose) 200mg  with procrit for anemia of chronic disease (b/c venofer alone was not helpful).  She is s/p 200mg  iron sucrose infusion w/in about the last month or two.  Pt feels like she needs more--chronic fatigue. Will refer to local hematologist for help with this issue, as I don't see that additional IV iron is needed at this time.    An After Visit Summary was printed and given to the patient.  FOLLOW UP: 1 mo

## 2013-05-28 ENCOUNTER — Ambulatory Visit: Payer: Medicare Other | Admitting: Family Medicine

## 2013-05-28 ENCOUNTER — Telehealth: Payer: Self-pay | Admitting: Hematology & Oncology

## 2013-05-28 LAB — URINALYSIS, ROUTINE W REFLEX MICROSCOPIC
Bilirubin Urine: NEGATIVE
Glucose, UA: NEGATIVE mg/dL
Leukocytes, UA: NEGATIVE
Specific Gravity, Urine: 1.02 (ref 1.005–1.030)
pH: 6 (ref 5.0–8.0)

## 2013-05-28 NOTE — Telephone Encounter (Signed)
Pt aware of 7-7 appointment °

## 2013-06-02 ENCOUNTER — Encounter: Payer: Self-pay | Admitting: Family Medicine

## 2013-06-02 DIAGNOSIS — R609 Edema, unspecified: Secondary | ICD-10-CM | POA: Insufficient documentation

## 2013-06-02 NOTE — Assessment & Plan Note (Signed)
Pt's self management of her xanax and zolpidem have been suspicious in the past. We compromised today: she'll remain on 1/2 tab xanax tid but may continue to give full 10mg  zolpidem qhs.

## 2013-06-02 NOTE — Assessment & Plan Note (Signed)
Hematologist in [place], [place] did extensive w/u; no cause found; failed oral supplement; had been getting fairly regular (q89m or so) IV iron infusions (Venofer/iron sucrose) 200mg  with procrit for anemia of chronic disease (b/c venofer alone was not helpful).  She is s/p 200mg  iron sucrose infusion w/in about the last month or two.  Pt feels like she needs more--chronic fatigue. Will refer to local hematologist for help with this issue, as I don't see that additional IV iron is needed at this time.

## 2013-06-02 NOTE — Assessment & Plan Note (Signed)
Increase toprol XL to 150mg  qd x 3d, then increase to 200mg  qd if bp not at goal at that time.   Continue benicar 40mg  qd and hctz 25mg  qd.

## 2013-06-02 NOTE — Assessment & Plan Note (Signed)
Suspect venous insufficiency. Check CMET and UA today.

## 2013-06-03 ENCOUNTER — Telehealth: Payer: Self-pay | Admitting: *Deleted

## 2013-06-03 NOTE — Telephone Encounter (Addendum)
Pt's daughter called to get names of In-house Rehab that were discussed at OV with patient & pt's son [on HIPAA], caller states; family would like to get a list of facilities that you would recommend, so that they may begin checking coverage for Insurance/SLS Please advise.

## 2013-06-04 ENCOUNTER — Telehealth: Payer: Self-pay | Admitting: Emergency Medicine

## 2013-06-04 NOTE — Telephone Encounter (Signed)
I only know of 3 but there are many more, I'm sure. I know of [facility] [address] in [place], [address] in [place], and [facility] in [place]. I recommend they check out [facility] first.-thx

## 2013-06-04 NOTE — Telephone Encounter (Signed)
Patient's husband called back today and asked for the information he discussed about treatment for his wife. I gave him the name of [facility] and phone number [phone] he's going to get information about this place for his family and will call back to schedule appointment with Dr.Mcgowen once the family is able to discuss everything with the patient.

## 2013-06-04 NOTE — Telephone Encounter (Signed)
Pt's spouse informed of provider recommendations; daughter is to be placed on pt's HIPAA next week, husband understood & states they will call for f/u OV to discuss in further detail next week/SLS

## 2013-06-21 ENCOUNTER — Ambulatory Visit (INDEPENDENT_AMBULATORY_CARE_PROVIDER_SITE_OTHER): Payer: Medicare Other | Admitting: Family Medicine

## 2013-06-21 ENCOUNTER — Encounter: Payer: Self-pay | Admitting: Family Medicine

## 2013-06-21 ENCOUNTER — Ambulatory Visit: Payer: Medicare Other | Admitting: Family Medicine

## 2013-06-21 VITALS — BP 145/78 | HR 96 | Temp 98.2°F | Resp 16 | Ht 63.0 in | Wt 166.0 lb

## 2013-06-21 DIAGNOSIS — I10 Essential (primary) hypertension: Secondary | ICD-10-CM | POA: Diagnosis not present

## 2013-06-21 DIAGNOSIS — L501 Idiopathic urticaria: Secondary | ICD-10-CM | POA: Diagnosis not present

## 2013-06-21 DIAGNOSIS — M129 Arthropathy, unspecified: Secondary | ICD-10-CM | POA: Diagnosis not present

## 2013-06-21 DIAGNOSIS — M1990 Unspecified osteoarthritis, unspecified site: Secondary | ICD-10-CM

## 2013-06-21 DIAGNOSIS — T783XXA Angioneurotic edema, initial encounter: Secondary | ICD-10-CM

## 2013-06-21 DIAGNOSIS — G4700 Insomnia, unspecified: Secondary | ICD-10-CM

## 2013-06-21 LAB — COMPREHENSIVE METABOLIC PANEL
ALT: 12 U/L (ref 0–35)
CO2: 28 mEq/L (ref 19–32)
Chloride: 100 mEq/L (ref 96–112)
GFR: 84.45 mL/min (ref >60.00)
Potassium: 3.5 mEq/L (ref 3.5–5.1)
Sodium: 135 mEq/L (ref 135–145)
Total Bilirubin: 0.5 mg/dL (ref 0.3–1.2)
Total Protein: 7.6 g/dL (ref 6.0–8.3)

## 2013-06-21 MED ORDER — PREDNISONE 20 MG PO TABS: ORAL_TABLET | ORAL | Status: DC

## 2013-06-21 MED ORDER — TRAZODONE HCL 50 MG PO TABS: ORAL_TABLET | ORAL | Status: DC

## 2013-06-21 NOTE — Progress Notes (Signed)
OFFICE NOTE  06/21/2013  CC:  Chief Complaint  Patient presents with  . Hypertension  . Rash     HPI: Patient is a 67 y.o. Caucasian female who is here for rash. Onset yesterday of lower lip swelling, progressed to upper lip swelling and some nasal/infraorbital area swelling over the last 24h. Also increased wheezing/albut requirement last 24h, which has improved today.  Throat has felt swollen since last night. No diarrhea.  No fever. No joint swelling or erythema. Some tachycardia, esp with wheezing and alb use.   Diffuse, itchy, pinkish rash on most of her body began yesterday.  OTC benadryl PO and benadryl cream--none have made any difference. She recalls no new foods or meds, no recent contact irritants/allergens.  Pertinent PMH:  HTN Hyperlipidemia Asthma Anxiety and depression Fibromyalgia Insomnia  Past surgical, social, and family history reviewed and no changes noted since last office visit.  MEDS:  Outpatient Prescriptions Prior to Visit  Medication Sig Dispense Refill  . albuterol (PROVENTIL HFA;VENTOLIN HFA) 108 (90 BASE) MCG/ACT inhaler Inhale 2 puffs into the lungs every 6 (six) hours as needed for wheezing. For shortness of breath      . ALPRAZolam (XANAX) 1 MG tablet 1/2 tab po tid  360 tablet  0  . amitriptyline (ELAVIL) 100 MG tablet Take 0.5 tablets (50 mg total) by mouth daily.  15 tablet  0  . aspirin EC 81 MG EC tablet Take 1 tablet (81 mg total) by mouth daily.  30 tablet  3  . atorvastatin (LIPITOR) 20 MG tablet Take 1 tablet (20 mg total) by mouth daily at 6 PM.  30 tablet  3  . budesonide-formoterol (SYMBICORT) 160-4.5 MCG/ACT inhaler Inhale 2 puffs into the lungs 2 (two) times daily.      . clopidogrel (PLAVIX) 75 MG tablet Take 1 tablet (75 mg total) by mouth daily with breakfast.  30 tablet  3  . estrogens, conjugated, (PREMARIN) 0.9 MG tablet Take 0.9 mg by mouth daily.      . hydrochlorothiazide (HYDRODIURIL) 25 MG tablet Take 1 tablet (25 mg  total) by mouth daily.  90 tablet  3  . meloxicam (MOBIC) 7.5 MG tablet Take 7.5 mg by mouth 2 (two) times daily.      . metoprolol succinate (TOPROL-XL) 100 MG 24 hr tablet Take 1 tablet (100 mg total) by mouth daily. Take with or immediately following a meal.  90 tablet  3  . metoprolol succinate (TOPROL-XL) 50 MG 24 hr tablet 1-2 tabs po qd  60 tablet  0  . Milnacipran (SAVELLA) 50 MG TABS Take 1 tablet (50 mg total) by mouth 2 (two) times daily.  60 tablet  3  . Multiple Vitamins-Minerals (CENTRUM SILVER ADULT 50+ PO) Take 1 tablet by mouth daily.      . nitroGLYCERIN (NITROSTAT) 0.4 MG SL tablet Place 1 tablet (0.4 mg total) under the tongue every 5 (five) minutes x 3 doses as needed for chest pain.  25 tablet  3  . olmesartan (BENICAR) 40 MG tablet Take 0.5 tablets (20 mg total) by mouth daily.  15 tablet  0  . potassium chloride SA (K-DUR,KLOR-CON) 20 MEQ tablet 2 tabs po qd x 3d, then 1 tab po qd x 2d, then stop  8 tablet  0  . pregabalin (LYRICA) 300 MG capsule Take 1 capsule (300 mg total) by mouth 2 (two) times daily.  60 capsule  6  . zolpidem (AMBIEN) 10 MG tablet Take 1 tablet (10  mg total) by mouth at bedtime as needed for sleep.  30 tablet  1   No facility-administered medications prior to visit.    PE: Blood pressure 145/78, pulse 96, temperature 98.2 F (36.8 C), temperature source Oral, resp. rate 16, height 5\' 3"  (1.6 m), weight 166 lb (75.297 kg), SpO2 96.00%. Gen: Alert, well appearing.  Patient is oriented to person, place, time, and situation. Face: subtle swelling of lips and right paranasal soft tissue.  No rash on face.  Tongue and throat w/out swelling or erythema or exudate. Eyes without erythema or swelling.   Neck - No masses or thyromegaly or limitation in range of motion CV: RRR, no m/r/g.   LUNGS: CTA bilat, nonlabored resps, good aeration in all lung fields. JOINTS: wrists and hands are bit warm but no erythema or tenderness or stiffness.  All other joints  without abnormality.  No LE edema. SKIN: scattered, barely palpable urticarial lesions, avg size 3 cm diamter.  No central clearing, no target lesions, no bruising.  Areas involved are flanks, groin, hands, feet (palms and soles), arms (esp antecubital fossae).   IMPRESSION AND PLAN:  Urticaria, idiopathic With some suggestion of angioedema on lips and perinasal area. No identifiable cause.  Prednisone 40mg  qd x 7d, then 20mg  qd x 7d, then 10mg  qd x 6d.  HTN (hypertension), benign Stable.  Continue current meds.  Insomnia D/c zolpidem and try 50mg  trazodone, 1 tab qhs for 3d and if not helpful then increase to 2 tabs qhs.  Therapeutic expectations and side effect profile of medication discussed today.  Patient's questions answered.    An After Visit Summary was printed and given to the patient.  FOLLOW UP: 1 wk

## 2013-06-22 DIAGNOSIS — L501 Idiopathic urticaria: Secondary | ICD-10-CM

## 2013-06-22 HISTORY — DX: Idiopathic urticaria: L50.1

## 2013-06-28 ENCOUNTER — Ambulatory Visit (HOSPITAL_BASED_OUTPATIENT_CLINIC_OR_DEPARTMENT_OTHER): Payer: Medicare Other

## 2013-06-28 ENCOUNTER — Ambulatory Visit: Payer: Medicare Other

## 2013-06-28 ENCOUNTER — Encounter: Payer: Self-pay | Admitting: Family Medicine

## 2013-06-28 ENCOUNTER — Encounter: Payer: Self-pay | Admitting: Hematology & Oncology

## 2013-06-28 ENCOUNTER — Ambulatory Visit (INDEPENDENT_AMBULATORY_CARE_PROVIDER_SITE_OTHER): Payer: Medicare Other | Admitting: Family Medicine

## 2013-06-28 ENCOUNTER — Other Ambulatory Visit (HOSPITAL_BASED_OUTPATIENT_CLINIC_OR_DEPARTMENT_OTHER): Payer: Medicare Other | Admitting: Lab

## 2013-06-28 ENCOUNTER — Ambulatory Visit (HOSPITAL_BASED_OUTPATIENT_CLINIC_OR_DEPARTMENT_OTHER): Payer: Medicare Other | Admitting: Hematology & Oncology

## 2013-06-28 VITALS — BP 131/68 | HR 86 | Temp 98.5°F | Resp 16 | Ht 63.0 in | Wt 170.0 lb

## 2013-06-28 VITALS — BP 129/76 | HR 92 | Temp 98.2°F | Resp 18 | Ht 63.0 in | Wt 170.0 lb

## 2013-06-28 DIAGNOSIS — M2550 Pain in unspecified joint: Secondary | ICD-10-CM | POA: Insufficient documentation

## 2013-06-28 DIAGNOSIS — G4700 Insomnia, unspecified: Secondary | ICD-10-CM

## 2013-06-28 DIAGNOSIS — D509 Iron deficiency anemia, unspecified: Secondary | ICD-10-CM

## 2013-06-28 DIAGNOSIS — IMO0001 Reserved for inherently not codable concepts without codable children: Secondary | ICD-10-CM | POA: Diagnosis not present

## 2013-06-28 DIAGNOSIS — Z862 Personal history of diseases of the blood and blood-forming organs and certain disorders involving the immune mechanism: Secondary | ICD-10-CM

## 2013-06-28 DIAGNOSIS — L501 Idiopathic urticaria: Secondary | ICD-10-CM | POA: Insufficient documentation

## 2013-06-28 DIAGNOSIS — M797 Fibromyalgia: Secondary | ICD-10-CM

## 2013-06-28 LAB — CBC WITH DIFFERENTIAL (CANCER CENTER ONLY)
BASO#: 0 10*3/uL (ref 0.0–0.2)
EOS%: 3.3 % (ref 0.0–7.0)
HGB: 11.5 g/dL — ABNORMAL LOW (ref 11.6–15.9)
MCH: 28.2 pg (ref 26.0–34.0)
MCHC: 32.8 g/dL (ref 32.0–36.0)
MONO%: 9.2 % (ref 0.0–13.0)
NEUT#: 4.1 10*3/uL (ref 1.5–6.5)
Platelets: 247 10*3/uL (ref 145–400)

## 2013-06-28 MED ORDER — METHYLPREDNISOLONE SODIUM SUCC 125 MG IJ SOLR
125.0000 mg | Freq: Once | INTRAMUSCULAR | Status: AC
  Administered 2013-06-28: 125 mg via INTRAVENOUS

## 2013-06-28 MED ORDER — SODIUM CHLORIDE 0.9 % IV SOLN
Freq: Once | INTRAVENOUS | Status: AC
  Administered 2013-06-28: 15:00:00 via INTRAVENOUS

## 2013-06-28 MED ORDER — ATORVASTATIN CALCIUM 20 MG PO TABS: 20.0000 mg | ORAL_TABLET | Freq: Every day | ORAL | Status: DC

## 2013-06-28 MED ORDER — OLMESARTAN MEDOXOMIL 40 MG PO TABS: 20.0000 mg | ORAL_TABLET | Freq: Every day | ORAL | Status: DC

## 2013-06-28 MED ORDER — HEPARIN SOD (PORK) LOCK FLUSH 100 UNIT/ML IV SOLN
250.0000 [IU] | Freq: Once | INTRAVENOUS | Status: DC | PRN
  Filled 2013-06-28: qty 5

## 2013-06-28 MED ORDER — SODIUM CHLORIDE 0.9 % IV SOLN
1020.0000 mg | Freq: Once | INTRAVENOUS | Status: AC
  Administered 2013-06-28: 1020 mg via INTRAVENOUS
  Filled 2013-06-28: qty 34

## 2013-06-28 MED ORDER — DIPHENHYDRAMINE HCL 50 MG/ML IJ SOLN
25.0000 mg | Freq: Once | INTRAMUSCULAR | Status: AC
  Administered 2013-06-28: 15:00:00 via INTRAVENOUS

## 2013-06-28 NOTE — Assessment & Plan Note (Signed)
Her minimal angioedema has resolved and her rash is gone. Finish steroid taper.

## 2013-06-28 NOTE — Assessment & Plan Note (Signed)
D/c zolpidem and try 50mg  trazodone, 1 tab qhs for 3d and if not helpful then increase to 2 tabs qhs.  Therapeutic expectations and side effect profile of medication discussed today.  Patient's questions answered.

## 2013-06-28 NOTE — Assessment & Plan Note (Signed)
Stable. Continue current meds.   

## 2013-06-28 NOTE — Assessment & Plan Note (Signed)
She is not doing too well but desires nothing new to treat this at this time. Continue current meds.

## 2013-06-28 NOTE — Assessment & Plan Note (Signed)
She has appt with Dr. Myna Hidalgo today to establish care.

## 2013-06-28 NOTE — Assessment & Plan Note (Signed)
Rheum labs here recently were normal. Patient had a rheumatologist in [place] and she requests referral to Dr. Lendon Colonel to establish care.

## 2013-06-28 NOTE — Assessment & Plan Note (Signed)
With some suggestion of angioedema on lips and perinasal area. No identifiable cause.  Prednisone 40mg  qd x 7d, then 20mg  qd x 7d, then 10mg  qd x 6d.

## 2013-06-28 NOTE — Progress Notes (Signed)
OFFICE NOTE  06/28/2013  CC:  Chief Complaint  Patient presents with  . Rash    follow up. better  . joint pain    can't sleep she hurts so bad at night  . Medication Refill     HPI: Patient is a 67 y.o. Caucasian female who is here for 1 wk f/u a first-time urticarial rash--seemingly idiopathic. Also we did a trial of trazodone for insomnia.   Rash is gone, she is compliant with her prednisone. Trazodone at the 100mg  dose helped only 10-15%.  She is open to trying this until we reach max dosing. She takes ambien 10mg  on the nights she doesn't take trazodone and this is somewhat more helpful. She also still takes three 1 mg xanax tabs throughout each day.  She sees her hematologist today to establish care regarding her hx of severe iron def anemia.  Pertinent PMH:  Insomnia Anxiety and depression Hyperlipidemia, asthma Fibromyalgia Chronic diffuse arthralgias  Past surgical, social, and family history reviewed: social hx update, pt dealing with the severe mesothelioma that her husband has and thinks about this situation a lot.  MEDS:  Outpatient Prescriptions Prior to Visit  Medication Sig Dispense Refill  . albuterol (PROVENTIL HFA;VENTOLIN HFA) 108 (90 BASE) MCG/ACT inhaler Inhale 2 puffs into the lungs every 6 (six) hours as needed for wheezing. For shortness of breath      . ALPRAZolam (XANAX) 1 MG tablet 1/2 tab po tid  360 tablet  0  . amitriptyline (ELAVIL) 100 MG tablet Take 0.5 tablets (50 mg total) by mouth daily.  15 tablet  0  . aspirin EC 81 MG EC tablet Take 1 tablet (81 mg total) by mouth daily.  30 tablet  3  . atorvastatin (LIPITOR) 20 MG tablet Take 1 tablet (20 mg total) by mouth daily at 6 PM.  30 tablet  3  . budesonide-formoterol (SYMBICORT) 160-4.5 MCG/ACT inhaler Inhale 2 puffs into the lungs 2 (two) times daily.      . clopidogrel (PLAVIX) 75 MG tablet Take 1 tablet (75 mg total) by mouth daily with breakfast.  30 tablet  3  . estrogens, conjugated,  (PREMARIN) 0.9 MG tablet Take 0.9 mg by mouth daily.      . hydrochlorothiazide (HYDRODIURIL) 25 MG tablet Take 1 tablet (25 mg total) by mouth daily.  90 tablet  3  . meloxicam (MOBIC) 7.5 MG tablet Take 7.5 mg by mouth 2 (two) times daily.      . metoprolol succinate (TOPROL-XL) 100 MG 24 hr tablet Take 1 tablet (100 mg total) by mouth daily. Take with or immediately following a meal.  90 tablet  3  . Milnacipran (SAVELLA) 50 MG TABS Take 1 tablet (50 mg total) by mouth 2 (two) times daily.  60 tablet  3  . Multiple Vitamins-Minerals (CENTRUM SILVER ADULT 50+ PO) Take 1 tablet by mouth daily.      . nitroGLYCERIN (NITROSTAT) 0.4 MG SL tablet Place 1 tablet (0.4 mg total) under the tongue every 5 (five) minutes x 3 doses as needed for chest pain.  25 tablet  3  . olmesartan (BENICAR) 40 MG tablet Take 0.5 tablets (20 mg total) by mouth daily.  15 tablet  0  . predniSONE (DELTASONE) 20 MG tablet 2 tabs po qd x 7d, then 1 tab po qd x 7d, then 1/2 tab po qd x 6d  24 tablet  0  . pregabalin (LYRICA) 300 MG capsule Take 1 capsule (300 mg total)  by mouth 2 (two) times daily.  60 capsule  6  . traZODone (DESYREL) 50 MG tablet 1-2 tabs po qhs for insomnia  30 tablet  0  . metoprolol succinate (TOPROL-XL) 50 MG 24 hr tablet 1-2 tabs po qd  60 tablet  0  . potassium chloride SA (K-DUR,KLOR-CON) 20 MEQ tablet 2 tabs po qd x 3d, then 1 tab po qd x 2d, then stop  8 tablet  0   No facility-administered medications prior to visit.    PE: Blood pressure 129/76, pulse 92, temperature 98.2 F (36.8 C), temperature source Oral, resp. rate 18, height 5\' 3"  (1.6 m), weight 170 lb (77.111 kg), SpO2 94.00%. Gen: Alert, well appearing.  Patient is oriented to person, place, time, and situation. SKIN: no rash Face: no swelling or erythema. Tongue and oropharynx without swelling or erythema. Musculoskeletal: no joint swelling, erythema, warmth, or tenderness.  ROM of all joints intact.  The MCPs and PIPs on both  hands are a bit hypertrophic.   IMPRESSION AND PLAN:  Urticaria, idiopathic Her minimal angioedema has resolved and her rash is gone. Finish steroid taper.   Insomnia Increase trazodone to 150mg  qhs for 2-3d and if not helpful then increase to max of 200mg  qhs and give this at least 3 day trial before returning to zolpidem.  Fibromyalgia syndrome She is not doing too well but desires nothing new to treat this at this time. Continue current meds.  Iron deficiency anemia, unspecified She has appt with Dr. Myna Hidalgo today to establish care.  Large joint arthralgia of multiple sites  Rheum labs here recently were normal. Patient had a rheumatologist in [place] and she requests referral to Dr. Lendon Colonel to establish care.   An After Visit Summary was printed and given to the patient.  FOLLOW UP: 1 mo

## 2013-06-28 NOTE — Assessment & Plan Note (Signed)
Increase trazodone to 150mg  qhs for 2-3d and if not helpful then increase to max of 200mg  qhs and give this at least 3 day trial before returning to zolpidem.

## 2013-06-28 NOTE — Progress Notes (Signed)
This office note has been dictated.

## 2013-06-28 NOTE — Patient Instructions (Signed)
Ferumoxytol injection What is this medicine? FERUMOXYTOL is an iron complex. Iron is used to make healthy red blood cells, which carry oxygen and nutrients throughout the body. This medicine is used to treat iron deficiency anemia in people with chronic kidney disease. This medicine may be used for other purposes; ask your health care provider or pharmacist if you have questions. What should I tell my health care provider before I take this medicine? They need to know if you have any of these conditions: -anemia not caused by low iron levels -high levels of iron in the blood -magnetic resonance imaging (MRI) test scheduled -an unusual or allergic reaction to iron, other medicines, foods, dyes, or preservatives -pregnant or trying to get pregnant -breast-feeding How should I use this medicine? This medicine is for infusion into a vein. It is given by a health care professional in a hospital or clinic setting. Talk to your pediatrician regarding the use of this medicine in children. Special care may be needed. Overdosage: If you think you've taken too much of this medicine contact a poison control center or emergency room at once. Overdosage: If you think you have taken too much of this medicine contact a poison control center or emergency room at once. NOTE: This medicine is only for you. Do not share this medicine with others. What if I miss a dose? It is important not to miss your dose. Call your doctor or health care professional if you are unable to keep an appointment. What may interact with this medicine? This medicine may interact with the following medications: -other iron products This list may not describe all possible interactions. Give your health care provider a list of all the medicines, herbs, non-prescription drugs, or dietary supplements you use. Also tell them if you smoke, drink alcohol, or use illegal drugs. Some items may interact with your medicine. What should I watch  for while using this medicine? Visit your doctor or healthcare professional regularly. Tell your doctor or healthcare professional if your symptoms do not start to get better or if they get worse. You may need blood work done while you are taking this medicine. You may need to follow a special diet. Talk to your doctor. Foods that contain iron include: whole grains/cereals, dried fruits, beans, or peas, leafy green vegetables, and organ meats (liver, kidney). What side effects may I notice from receiving this medicine? Side effects that you should report to your doctor or health care professional as soon as possible: -allergic reactions like skin rash, itching or hives, swelling of the face, lips, or tongue -breathing problems -changes in blood pressure -feeling faint or lightheaded, falls -fever or chills -flushing, sweating, or hot feelings -swelling of the ankles or feet Side effects that usually do not require medical attention (Report these to your doctor or health care professional if they continue or are bothersome.): -diarrhea -headache -nausea, vomiting -stomach pain This list may not describe all possible side effects. Call your doctor for medical advice about side effects. You may report side effects to FDA at 1-800-FDA-1088. Where should I keep my medicine? This drug is given in a hospital or clinic and will not be stored at home. NOTE: This sheet is a summary. It may not cover all possible information. If you have questions about this medicine, talk to your doctor, pharmacist, or health care provider.  2012, [employer]/Gold Standard. (08/31/2008 9:48:25 PM) 

## 2013-06-29 LAB — IRON AND TIBC CHCC: TIBC: 363 ug/dL (ref 236–444)

## 2013-06-29 LAB — RETICULOCYTES (CHCC): Retic Ct Pct: 1.4 % (ref 0.4–2.3)

## 2013-06-29 LAB — ERYTHROPOIETIN: Erythropoietin: 6.3 m[IU]/mL (ref 2.6–18.5)

## 2013-06-29 NOTE — Progress Notes (Signed)
CC:   Linda Massed, MD  DIAGNOSIS:  Now recurrent iron deficiency anemia.  HISTORY OF PRESENT ILLNESS:  Linda [name] is a very charming 67 year old white female.  She and her family has been up here for 8 months.  They were originally from [place], [place].  Unfortunately, however, her husband was recently diagnosed with mesothelioma. He is going to be undergoing surgery at [facility] for this.  She has had a history of iron deficiency anemia.  She has had an extensive workup for this.  She currently sees Dr. Milinda Cave.  He has given her some IV iron but a "low dose."  She has numerous health issues.  She is mostly bothered by fibromyalgia and arthritis.  She has had iron studies done.  She knows when she needs iron.  She feels very tired.  Her last iron studies back in June showed a ferritin of 90 with an iron saturation of 11%.  Total iron was 44.  This, for her, is markedly iron deficient from what she says.  She has had no bleeding.  She has had no weight loss or weight gain. There has been no change in medications.  Again, she is on quite a few medications.  She has had no fevers, sweats, or chills.  There has not any kind of rashes.  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  She has not noted any cough or shortness breath.  There is no dysphagia or odynophagia.  PAST MEDICAL HISTORY:  Remarkable: 1. Hypertension. 2. Fibromyalgia. 3. Migraines. 4. Nephrolithiasis. 5. Diverticulosis. 6. Insomnia. 7. Depression with anxiety. 8. History of syncope.  ALLERGIES:  Penicillin.  MEDICATIONS: 1. Proventil inhaler 2 puffs every 6 hours p.r.n. 2. Xanax 0.5 mg p.o. t.i.d. 3. Elavil 50 mg p.o. daily. 4. Aspirin 81 mg p.o. daily. 5. Lipitor 20 mg p.o. daily. 6. Symbicort (160/4.5) 2 puffs b.i.d. 7. Plavix 75 mg p.o. daily. 8. Premarin 0.9 mg p.o. daily. 9. Hydrochlorothiazide 25 mg p.o. daily. 10.Mobic 7.5 mg p.o. b.i.d. 11.Toprol-XL 100 mg  p.o. daily. 12.Savella 50 mg p.o. b.i.d. 13.Benicar 20 mg p.o. daily. 14.Lyrica 300 mg p.o. b.i.d. 15.Ambien 5 mg p.o. q.h.s. p.r.n.  SOCIAL HISTORY:  Negative for tobacco or alcohol use.  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  FAMILY HISTORY:  Pretty much noncontributory. Their son has multiple sclerosis.  REVIEW OF SYSTEMS:  As stated in history of present history present illness.  No additional findings noted on 12-system review.  PHYSICAL EXAMINATION:  General:  This is a well-developed, well- nourished white female in no obvious distress.  Vital signs: Temperature of 98.5, pulse 86, respiratory rate 16, blood pressure 131/68.  Weight is 170.  Head and neck:  Normocephalic, atraumatic skull.  There are no ocular or oral lesions.  There is no scleral icterus.  Conjunctivae are slightly pale.  There are no intraoral lesions.  No adenopathy is noted in the neck.  Lungs:  Clear bilaterally.  Cardiac:  Regular rate and rhythm with a normal S1, S2. There are no murmurs, rubs or bruits.  Abdomen:  Soft with good bowel sounds.  There is no palpable abdominal mass.  There is no fluid wave. There is no palpable hepatosplenomegaly.  Extremities:  Show some tenderness over her long bones.  There is some slight joint swelling in several joints.  No erythema or warmth is noted with the joints.  She has some stiffness in her joints.  Neurological:  Shows no focal neurological deficits.  LABORATORY STUDIES:  White cell count 7.1, hemoglobin 11.5, hematocrit 35.1, platelet count 247. MCV is 86.  IMPRESSION:  Linda [name] has had a peripheral smear, which shows a mild anisocytosis and poikilocytosis.  She does have some increased microcytic red cells.  I see no nucleated red blood cells.  There is no rouleaux formation.  There are no target cells.  I see no schistocytes or spherocytes.  White cells appear normal in morphology maturation. There are  no hypersegmented polys.  There is  no immature myeloid or lymphoid forms.  I see no atypical lymphocytes.  Platelets are adequate in number and size.  Platelets are well granulated.  Linda [name] is a very charming 67 year old white female with iron deficiency anemia.  Again, this has been an ongoing problem for her. She has been known to have this for about 14 years.  We will go ahead and give her dose of IV iron while she is here today. I think this is very reasonable.  She, herself, knows when she is iron deficient.  I want her to try to feel better so that she will feel well for when her husband has his surgery and recovery.  I did go ahead and give her a prayer blanket.  She is very appreciative of this.  I spent about an hour with Linda [name].  I want to see her back in about 3 weeks or so.  Will definitely know how the iron has helped her by then.    ______________________________ Josph Macho, M.D. PRE/MEDQ  D:  06/28/2013  T:  06/29/2013  Job:  [id number]

## 2013-07-05 ENCOUNTER — Other Ambulatory Visit: Payer: Self-pay | Admitting: Family Medicine

## 2013-07-05 ENCOUNTER — Telehealth: Payer: Self-pay | Admitting: Family Medicine

## 2013-07-05 MED ORDER — ZOLPIDEM TARTRATE 5 MG PO TABS: 5.0000 mg | ORAL_TABLET | Freq: Every evening | ORAL | Status: DC | PRN

## 2013-07-05 NOTE — Telephone Encounter (Signed)
Pharmacy sent request for ambien 10mg  to [place].  I don't even see on patients med list.  Please advise refill.

## 2013-07-05 NOTE — Telephone Encounter (Signed)
Faxed rx to pharmacy  

## 2013-07-05 NOTE — Telephone Encounter (Signed)
OK.  Printed rx for 5mg  tabs--this is her appropriate max dose due to age/safety concerns.--thx

## 2013-07-12 ENCOUNTER — Ambulatory Visit (INDEPENDENT_AMBULATORY_CARE_PROVIDER_SITE_OTHER): Payer: Medicare Other | Admitting: Family Medicine

## 2013-07-12 ENCOUNTER — Inpatient Hospital Stay (HOSPITAL_COMMUNITY)
Admission: EM | Admit: 2013-07-12 | Discharge: 2013-07-19 | DRG: 189 | Disposition: A | Discharge status: Home / Self Care | Payer: Medicare Other | Attending: Internal Medicine | Admitting: Internal Medicine

## 2013-07-12 ENCOUNTER — Emergency Department (HOSPITAL_COMMUNITY): Payer: Medicare Other

## 2013-07-12 ENCOUNTER — Encounter: Payer: Self-pay | Admitting: Family Medicine

## 2013-07-12 ENCOUNTER — Encounter (HOSPITAL_COMMUNITY): Payer: Self-pay | Admitting: Emergency Medicine

## 2013-07-12 VITALS — BP 149/77 | HR 114 | Temp 98.4°F | Resp 22

## 2013-07-12 DIAGNOSIS — D509 Iron deficiency anemia, unspecified: Secondary | ICD-10-CM | POA: Diagnosis present

## 2013-07-12 DIAGNOSIS — Z87440 Personal history of urinary (tract) infections: Secondary | ICD-10-CM

## 2013-07-12 DIAGNOSIS — E785 Hyperlipidemia, unspecified: Secondary | ICD-10-CM | POA: Diagnosis present

## 2013-07-12 DIAGNOSIS — Z79899 Other long term (current) drug therapy: Secondary | ICD-10-CM

## 2013-07-12 DIAGNOSIS — J45901 Unspecified asthma with (acute) exacerbation: Secondary | ICD-10-CM | POA: Insufficient documentation

## 2013-07-12 DIAGNOSIS — M797 Fibromyalgia: Secondary | ICD-10-CM | POA: Diagnosis present

## 2013-07-12 DIAGNOSIS — E119 Type 2 diabetes mellitus without complications: Secondary | ICD-10-CM | POA: Diagnosis present

## 2013-07-12 DIAGNOSIS — E7800 Pure hypercholesterolemia, unspecified: Secondary | ICD-10-CM | POA: Diagnosis present

## 2013-07-12 DIAGNOSIS — I2510 Atherosclerotic heart disease of native coronary artery without angina pectoris: Secondary | ICD-10-CM | POA: Diagnosis present

## 2013-07-12 DIAGNOSIS — R0902 Hypoxemia: Secondary | ICD-10-CM | POA: Diagnosis not present

## 2013-07-12 DIAGNOSIS — J90 Pleural effusion, not elsewhere classified: Secondary | ICD-10-CM | POA: Diagnosis not present

## 2013-07-12 DIAGNOSIS — M1990 Unspecified osteoarthritis, unspecified site: Secondary | ICD-10-CM | POA: Diagnosis present

## 2013-07-12 DIAGNOSIS — R0602 Shortness of breath: Secondary | ICD-10-CM

## 2013-07-12 DIAGNOSIS — M5137 Other intervertebral disc degeneration, lumbosacral region: Secondary | ICD-10-CM | POA: Diagnosis present

## 2013-07-12 DIAGNOSIS — M51379 Other intervertebral disc degeneration, lumbosacral region without mention of lumbar back pain or lower extremity pain: Secondary | ICD-10-CM | POA: Diagnosis present

## 2013-07-12 DIAGNOSIS — F1910 Other psychoactive substance abuse, uncomplicated: Secondary | ICD-10-CM | POA: Diagnosis present

## 2013-07-12 DIAGNOSIS — J9600 Acute respiratory failure, unspecified whether with hypoxia or hypercapnia: Principal | ICD-10-CM | POA: Diagnosis present

## 2013-07-12 DIAGNOSIS — J811 Chronic pulmonary edema: Secondary | ICD-10-CM | POA: Diagnosis not present

## 2013-07-12 DIAGNOSIS — M79609 Pain in unspecified limb: Secondary | ICD-10-CM | POA: Diagnosis present

## 2013-07-12 DIAGNOSIS — IMO0001 Reserved for inherently not codable concepts without codable children: Secondary | ICD-10-CM | POA: Diagnosis present

## 2013-07-12 DIAGNOSIS — I10 Essential (primary) hypertension: Secondary | ICD-10-CM | POA: Diagnosis not present

## 2013-07-12 DIAGNOSIS — F329 Major depressive disorder, single episode, unspecified: Secondary | ICD-10-CM | POA: Diagnosis present

## 2013-07-12 DIAGNOSIS — F411 Generalized anxiety disorder: Secondary | ICD-10-CM | POA: Diagnosis present

## 2013-07-12 DIAGNOSIS — J61 Pneumoconiosis due to asbestos and other mineral fibers: Secondary | ICD-10-CM | POA: Diagnosis present

## 2013-07-12 DIAGNOSIS — F3289 Other specified depressive episodes: Secondary | ICD-10-CM | POA: Diagnosis present

## 2013-07-12 DIAGNOSIS — J4551 Severe persistent asthma with (acute) exacerbation: Secondary | ICD-10-CM

## 2013-07-12 DIAGNOSIS — M2550 Pain in unspecified joint: Secondary | ICD-10-CM

## 2013-07-12 DIAGNOSIS — E876 Hypokalemia: Secondary | ICD-10-CM | POA: Diagnosis not present

## 2013-07-12 DIAGNOSIS — G4733 Obstructive sleep apnea (adult) (pediatric): Secondary | ICD-10-CM | POA: Diagnosis present

## 2013-07-12 DIAGNOSIS — R609 Edema, unspecified: Secondary | ICD-10-CM

## 2013-07-12 DIAGNOSIS — F410 Panic disorder [episodic paroxysmal anxiety] without agoraphobia: Secondary | ICD-10-CM | POA: Diagnosis present

## 2013-07-12 DIAGNOSIS — G8929 Other chronic pain: Secondary | ICD-10-CM | POA: Diagnosis present

## 2013-07-12 HISTORY — DX: Chronic tension-type headache, not intractable: G44.229

## 2013-07-12 HISTORY — DX: Dependence on other enabling machines and devices: Z99.89

## 2013-07-12 HISTORY — DX: Other chronic pain: G89.29

## 2013-07-12 HISTORY — DX: Low back pain: M54.5

## 2013-07-12 HISTORY — DX: Low back pain, unspecified: M54.50

## 2013-07-12 HISTORY — DX: Obstructive sleep apnea (adult) (pediatric): G47.33

## 2013-07-12 HISTORY — DX: Personal history of other diseases of the digestive system: Z87.19

## 2013-07-12 HISTORY — DX: Angina pectoris, unspecified: I20.9

## 2013-07-12 HISTORY — DX: Tubulo-interstitial nephritis, not specified as acute or chronic: N12

## 2013-07-12 LAB — COMPREHENSIVE METABOLIC PANEL
ALT: 23 U/L (ref 0–35)
AST: 25 U/L (ref 0–37)
Albumin: 3.5 g/dL (ref 3.5–5.2)
Alkaline Phosphatase: 79 U/L (ref 39–117)
Chloride: 104 mEq/L (ref 96–112)
Creatinine, Ser: 0.69 mg/dL (ref 0.50–1.10)
Potassium: 3.1 mEq/L — ABNORMAL LOW (ref 3.5–5.1)
Sodium: 142 mEq/L (ref 135–145)
Total Bilirubin: 0.1 mg/dL — ABNORMAL LOW (ref 0.3–1.2)

## 2013-07-12 LAB — CBC
HCT: 31.8 % — ABNORMAL LOW (ref 36.0–46.0)
Hemoglobin: 10.5 g/dL — ABNORMAL LOW (ref 12.0–15.0)
MCHC: 33 g/dL (ref 30.0–36.0)
MCV: 86.2 fL (ref 78.0–100.0)
Platelets: 178 10*3/uL (ref 150–400)
RDW: 16.2 % — ABNORMAL HIGH (ref 11.5–15.5)
WBC: 10.5 10*3/uL (ref 4.0–10.5)

## 2013-07-12 LAB — PRO B NATRIURETIC PEPTIDE: Pro B Natriuretic peptide (BNP): 214.8 pg/mL — ABNORMAL HIGH (ref 0–125)

## 2013-07-12 LAB — CREATININE, SERUM: GFR calc non Af Amer: >90 mL/min (ref >90)

## 2013-07-12 MED ORDER — ALUM & MAG HYDROXIDE-SIMETH 200-200-20 MG/5ML PO SUSP: 30.0000 mL | Freq: Four times a day (QID) | ORAL | Status: DC | PRN

## 2013-07-12 MED ORDER — ONDANSETRON HCL 4 MG/2ML IJ SOLN: 4.0000 mg | Freq: Four times a day (QID) | INTRAMUSCULAR | Status: DC | PRN

## 2013-07-12 MED ORDER — ZOLPIDEM TARTRATE 5 MG PO TABS
5.0000 mg | ORAL_TABLET | Freq: Every evening | ORAL | Status: DC | PRN
  Administered 2013-07-13: 5 mg via ORAL
  Filled 2013-07-12: qty 1

## 2013-07-12 MED ORDER — SODIUM CHLORIDE 0.9 % IV BOLUS (SEPSIS)
1000.0000 mL | Freq: Once | INTRAVENOUS | Status: AC
  Administered 2013-07-12: 1000 mL via INTRAVENOUS

## 2013-07-12 MED ORDER — MELOXICAM 7.5 MG PO TABS
7.5000 mg | ORAL_TABLET | Freq: Two times a day (BID) | ORAL | Status: DC
  Administered 2013-07-12 – 2013-07-19 (×14): 7.5 mg via ORAL
  Filled 2013-07-12 (×17): qty 1

## 2013-07-12 MED ORDER — PREGABALIN 100 MG PO CAPS
300.0000 mg | ORAL_CAPSULE | Freq: Two times a day (BID) | ORAL | Status: DC
  Administered 2013-07-13 – 2013-07-19 (×13): 300 mg via ORAL
  Filled 2013-07-12 (×13): qty 3

## 2013-07-12 MED ORDER — METHYLPREDNISOLONE SODIUM SUCC 125 MG IJ SOLR
125.0000 mg | Freq: Once | INTRAMUSCULAR | Status: AC
  Administered 2013-07-12: 125 mg via INTRAVENOUS
  Filled 2013-07-12: qty 2

## 2013-07-12 MED ORDER — LEVOFLOXACIN IN D5W 750 MG/150ML IV SOLN
750.0000 mg | INTRAVENOUS | Status: DC
  Administered 2013-07-12 – 2013-07-13 (×2): 750 mg via INTRAVENOUS
  Filled 2013-07-12 (×2): qty 150

## 2013-07-12 MED ORDER — ONDANSETRON HCL 4 MG PO TABS: 4.0000 mg | ORAL_TABLET | Freq: Four times a day (QID) | ORAL | Status: DC | PRN

## 2013-07-12 MED ORDER — LEVALBUTEROL HCL 0.63 MG/3ML IN NEBU
0.6300 mg | INHALATION_SOLUTION | RESPIRATORY_TRACT | Status: DC
  Administered 2013-07-12 – 2013-07-14 (×9): 0.63 mg via RESPIRATORY_TRACT
  Filled 2013-07-12 (×16): qty 3

## 2013-07-12 MED ORDER — ACETAMINOPHEN 325 MG PO TABS: 650.0000 mg | ORAL_TABLET | Freq: Four times a day (QID) | ORAL | Status: DC | PRN

## 2013-07-12 MED ORDER — ALBUTEROL SULFATE (2.5 MG/3ML) 0.083% IN NEBU
2.5000 mg | INHALATION_SOLUTION | Freq: Once | RESPIRATORY_TRACT | Status: AC
  Administered 2013-07-12: 2.5 mg via RESPIRATORY_TRACT

## 2013-07-12 MED ORDER — IPRATROPIUM BROMIDE 0.02 % IN SOLN
0.5000 mg | Freq: Once | RESPIRATORY_TRACT | Status: AC
  Administered 2013-07-12: 0.5 mg via RESPIRATORY_TRACT

## 2013-07-12 MED ORDER — LEVALBUTEROL HCL 0.63 MG/3ML IN NEBU
0.6300 mg | INHALATION_SOLUTION | RESPIRATORY_TRACT | Status: DC | PRN
  Administered 2013-07-15 – 2013-07-17 (×2): 0.63 mg via RESPIRATORY_TRACT
  Filled 2013-07-12 (×3): qty 3

## 2013-07-12 MED ORDER — IPRATROPIUM BROMIDE 0.02 % IN SOLN
0.5000 mg | Freq: Once | RESPIRATORY_TRACT | Status: AC
  Administered 2013-07-12: 0.5 mg via RESPIRATORY_TRACT
  Filled 2013-07-12: qty 2.5

## 2013-07-12 MED ORDER — ALPRAZOLAM 0.5 MG PO TABS
0.5000 mg | ORAL_TABLET | Freq: Three times a day (TID) | ORAL | Status: DC | PRN
  Administered 2013-07-12: 0.5 mg via ORAL
  Filled 2013-07-12 (×2): qty 1

## 2013-07-12 MED ORDER — SODIUM CHLORIDE 0.9 % IJ SOLN
3.0000 mL | Freq: Two times a day (BID) | INTRAMUSCULAR | Status: DC
  Administered 2013-07-12 – 2013-07-19 (×12): 3 mL via INTRAVENOUS

## 2013-07-12 MED ORDER — METHYLPREDNISOLONE SODIUM SUCC 125 MG IJ SOLR
60.0000 mg | Freq: Four times a day (QID) | INTRAMUSCULAR | Status: DC
  Administered 2013-07-12 – 2013-07-14 (×7): 60 mg via INTRAVENOUS
  Filled 2013-07-12 (×2): qty 0.96
  Filled 2013-07-12: qty 2
  Filled 2013-07-12: qty 0.96
  Filled 2013-07-12: qty 2
  Filled 2013-07-12 (×8): qty 0.96

## 2013-07-12 MED ORDER — ATORVASTATIN CALCIUM 20 MG PO TABS
20.0000 mg | ORAL_TABLET | Freq: Every day | ORAL | Status: DC
  Administered 2013-07-12 – 2013-07-18 (×7): 20 mg via ORAL
  Filled 2013-07-12 (×8): qty 1

## 2013-07-12 MED ORDER — NITROGLYCERIN 0.4 MG SL SUBL
0.4000 mg | SUBLINGUAL_TABLET | SUBLINGUAL | Status: DC | PRN
  Administered 2013-07-14 – 2013-07-18 (×8): 0.4 mg via SUBLINGUAL
  Filled 2013-07-12 (×2): qty 25
  Filled 2013-07-12: qty 75
  Filled 2013-07-12: qty 25

## 2013-07-12 MED ORDER — GUAIFENESIN ER 600 MG PO TB12
600.0000 mg | ORAL_TABLET | Freq: Two times a day (BID) | ORAL | Status: DC
  Administered 2013-07-12 – 2013-07-19 (×14): 600 mg via ORAL
  Filled 2013-07-12 (×15): qty 1

## 2013-07-12 MED ORDER — MAGNESIUM SULFATE 40 MG/ML IJ SOLN
2.0000 g | Freq: Once | INTRAMUSCULAR | Status: AC
Start: 2013-07-12 — End: 2013-07-12
  Administered 2013-07-12: 2 g via INTRAVENOUS
  Filled 2013-07-12: qty 50

## 2013-07-12 MED ORDER — IPRATROPIUM BROMIDE 0.02 % IN SOLN
0.5000 mg | RESPIRATORY_TRACT | Status: DC
  Administered 2013-07-12 – 2013-07-14 (×9): 0.5 mg via RESPIRATORY_TRACT
  Filled 2013-07-12 (×10): qty 2.5

## 2013-07-12 MED ORDER — HYDROMORPHONE HCL PF 1 MG/ML IJ SOLN: 1.0000 mg | INTRAMUSCULAR | Status: DC | PRN

## 2013-07-12 MED ORDER — IPRATROPIUM BROMIDE 0.02 % IN SOLN
0.5000 mg | RESPIRATORY_TRACT | Status: DC
  Filled 2013-07-12: qty 2.5

## 2013-07-12 MED ORDER — MILNACIPRAN HCL 50 MG PO TABS
50.0000 mg | ORAL_TABLET | Freq: Two times a day (BID) | ORAL | Status: DC
  Administered 2013-07-12 – 2013-07-19 (×14): 50 mg via ORAL
  Filled 2013-07-12 (×15): qty 1

## 2013-07-12 MED ORDER — GUAIFENESIN-DM 100-10 MG/5ML PO SYRP
5.0000 mL | ORAL_SOLUTION | ORAL | Status: DC | PRN
  Filled 2013-07-12: qty 5

## 2013-07-12 MED ORDER — ASPIRIN EC 81 MG PO TBEC
81.0000 mg | DELAYED_RELEASE_TABLET | Freq: Every day | ORAL | Status: DC
  Administered 2013-07-12 – 2013-07-19 (×8): 81 mg via ORAL
  Filled 2013-07-12 (×8): qty 1

## 2013-07-12 MED ORDER — BUDESONIDE 0.25 MG/2ML IN SUSP
0.2500 mg | Freq: Two times a day (BID) | RESPIRATORY_TRACT | Status: DC
  Administered 2013-07-12 – 2013-07-19 (×13): 0.25 mg via RESPIRATORY_TRACT
  Filled 2013-07-12 (×17): qty 2

## 2013-07-12 MED ORDER — FENTANYL CITRATE 0.05 MG/ML IJ SOLN
50.0000 ug | Freq: Once | INTRAMUSCULAR | Status: AC
  Administered 2013-07-12: 50 ug via INTRAVENOUS
  Filled 2013-07-12: qty 2

## 2013-07-12 MED ORDER — LEVALBUTEROL HCL 0.63 MG/3ML IN NEBU
0.6300 mg | INHALATION_SOLUTION | RESPIRATORY_TRACT | Status: DC
  Filled 2013-07-12: qty 3

## 2013-07-12 MED ORDER — CLOPIDOGREL BISULFATE 75 MG PO TABS
75.0000 mg | ORAL_TABLET | Freq: Every day | ORAL | Status: DC
  Administered 2013-07-13 – 2013-07-19 (×7): 75 mg via ORAL
  Filled 2013-07-12 (×8): qty 1

## 2013-07-12 MED ORDER — HYDROCODONE-ACETAMINOPHEN 5-325 MG PO TABS
1.0000 | ORAL_TABLET | ORAL | Status: DC | PRN
  Administered 2013-07-12 – 2013-07-13 (×2): 1 via ORAL
  Administered 2013-07-13: 2 via ORAL
  Administered 2013-07-13 (×2): 1 via ORAL
  Administered 2013-07-14 – 2013-07-19 (×20): 2 via ORAL
  Filled 2013-07-12: qty 1
  Filled 2013-07-12 (×14): qty 2
  Filled 2013-07-12: qty 1
  Filled 2013-07-12 (×6): qty 2
  Filled 2013-07-12 (×2): qty 1
  Filled 2013-07-12: qty 2

## 2013-07-12 MED ORDER — ALBUTEROL SULFATE (5 MG/ML) 0.5% IN NEBU
5.0000 mg | INHALATION_SOLUTION | Freq: Once | RESPIRATORY_TRACT | Status: AC
  Administered 2013-07-12: 5 mg via RESPIRATORY_TRACT
  Filled 2013-07-12: qty 1

## 2013-07-12 MED ORDER — ENOXAPARIN SODIUM 40 MG/0.4ML ~~LOC~~ SOLN
40.0000 mg | SUBCUTANEOUS | Status: DC
  Administered 2013-07-12 – 2013-07-18 (×7): 40 mg via SUBCUTANEOUS
  Filled 2013-07-12 (×8): qty 0.4

## 2013-07-12 MED ORDER — METOPROLOL SUCCINATE ER 100 MG PO TB24
100.0000 mg | ORAL_TABLET | Freq: Every day | ORAL | Status: DC
  Administered 2013-07-13 – 2013-07-17 (×5): 100 mg via ORAL
  Filled 2013-07-12 (×6): qty 1

## 2013-07-12 MED ORDER — HYDROCHLOROTHIAZIDE 25 MG PO TABS
25.0000 mg | ORAL_TABLET | Freq: Every day | ORAL | Status: DC
  Administered 2013-07-13 – 2013-07-15 (×3): 25 mg via ORAL
  Filled 2013-07-12 (×4): qty 1

## 2013-07-12 MED ORDER — ACETAMINOPHEN 650 MG RE SUPP: 650.0000 mg | Freq: Four times a day (QID) | RECTAL | Status: DC | PRN

## 2013-07-12 MED ORDER — PANTOPRAZOLE SODIUM 40 MG PO TBEC
40.0000 mg | DELAYED_RELEASE_TABLET | Freq: Every day | ORAL | Status: DC
  Administered 2013-07-13 – 2013-07-19 (×7): 40 mg via ORAL
  Filled 2013-07-12 (×7): qty 1

## 2013-07-12 MED ORDER — AMITRIPTYLINE HCL 50 MG PO TABS
50.0000 mg | ORAL_TABLET | Freq: Every day | ORAL | Status: DC
  Administered 2013-07-12 – 2013-07-18 (×7): 50 mg via ORAL
  Filled 2013-07-12 (×8): qty 1

## 2013-07-12 NOTE — ED Provider Notes (Signed)
History    CSN: [id number] Arrival date & time 07/12/13  1311  First MD Initiated Contact with Patient 07/12/13 1327     Chief Complaint  Patient presents with  . Shortness of Breath   (Consider location/radiation/quality/duration/timing/severity/associated sxs/prior Treatment) HPI Comments: Patient is a 67 y/o female with an extensive PMH including asbestosis who presents for SOB x 3 days, worsening over the last 48 hours. Patient states SOB is worse when exerting herself or speaking for long periods of time; states she is only able to speak a few words at a time. Symptoms have been associated with a dry, nonproductive cough, audible wheezes, and an aching pain in her b/l legs and low back. She also admits to associated right sided chest pain yesterday which radiated to her R arm and R jaw. Patient states she took SL NTG x 3 with relief of chest pain; chest pain associated with diaphoresis and nausea. Patient denies emesis. She further denies fevers, syncope, vision changes, abdominal pain, urinary symptoms, and extremity weakness.  PCP - Dr. Milinda Cave of [place]. Patient sent to ED from PCP office for further evaluation of symptoms. On arrival to PCP, patient satting 89% on RA. Given neb tx x 2 as well as another 2 neb tx in route to ED with sats improving to 97%. Patient denies use of home O2.   Patient is a 67 y.o. female presenting with shortness of breath. The history is provided by the patient. No language interpreter was used.  Shortness of Breath Associated symptoms: chest pain, cough and wheezing   Associated symptoms: no abdominal pain, no fever and no vomiting    Past Surgical History  Procedure Laterality Date  . Appendectomy  1960  . Total abdominal hysterectomy  1974  . Tendon release  1996    Right forearm and hand  . Knee surgery  2005  . Heel spur surgery  2008    left foot  . Plantar fascia release  2008    left  . Axillary surgery  1978    Multiple "lump" in  armpit per pt  . Coccyx removal  1972  . Cardiac catheterization  01/2013    nonobstructive CAD, EF 55-60%  . Transthoracic echocardiogram  01/2013    NORMAL   Family History  Problem Relation Age of Onset  . Arthritis Mother   . Kidney disease Mother   . Heart disease Father   . Stroke Father   . Hypertension Father   . Diabetes Father    History  Substance Use Topics  . Smoking status: Never Smoker   . Smokeless tobacco: Never Used  . Alcohol Use: No   OB History   Grav Para Term Preterm Abortions TAB SAB Ect Mult Living                 Review of Systems  Constitutional: Negative for fever.  Respiratory: Positive for cough, shortness of breath and wheezing.   Cardiovascular: Positive for chest pain and leg swelling (L foot/ankle).  Gastrointestinal: Positive for nausea. Negative for vomiting and abdominal pain.  Musculoskeletal: Positive for myalgias and back pain.  Neurological: Negative for syncope and numbness.  All other systems reviewed and are negative.   Allergies  Penicillins  Home Medications   Current Outpatient Rx  Name  Route  Sig  Dispense  Refill  . albuterol (PROVENTIL HFA;VENTOLIN HFA) 108 (90 BASE) MCG/ACT inhaler   Inhalation   Inhale 2 puffs into the lungs every 6 (six)  hours as needed for wheezing. For shortness of breath         . ALPRAZolam (XANAX) 1 MG tablet      1 mg. 1 tab in AM and Noon and 1/2 tab in PM and Bedtime.         [name] Kitchen amitriptyline (ELAVIL) 100 MG tablet   Oral   Take 50 mg by mouth at bedtime.         [name] Kitchen aspirin EC 81 MG EC tablet   Oral   Take 1 tablet (81 mg total) by mouth daily.   30 tablet   3   . atorvastatin (LIPITOR) 20 MG tablet   Oral   Take 1 tablet (20 mg total) by mouth daily at 6 PM.   30 tablet   6   . budesonide-formoterol (SYMBICORT) 160-4.5 MCG/ACT inhaler   Inhalation   Inhale 2 puffs into the lungs 2 (two) times daily.         . clopidogrel (PLAVIX) 75 MG tablet   Oral   Take 1  tablet (75 mg total) by mouth daily with breakfast.   30 tablet   3   . hydrochlorothiazide (HYDRODIURIL) 25 MG tablet   Oral   Take 1 tablet (25 mg total) by mouth daily.   90 tablet   3   . meloxicam (MOBIC) 7.5 MG tablet   Oral   Take 7.5 mg by mouth 2 (two) times daily.         . metoprolol succinate (TOPROL-XL) 100 MG 24 hr tablet   Oral   Take 1 tablet (100 mg total) by mouth daily. Take with or immediately following a meal.   90 tablet   3   . Milnacipran (SAVELLA) 50 MG TABS   Oral   Take 1 tablet (50 mg total) by mouth 2 (two) times daily.   60 tablet   3   . Multiple Vitamins-Minerals (CENTRUM SILVER ADULT 50+ PO)   Oral   Take 1 tablet by mouth daily.         . nitroGLYCERIN (NITROSTAT) 0.4 MG SL tablet   Sublingual   Place 1 tablet (0.4 mg total) under the tongue every 5 (five) minutes x 3 doses as needed for chest pain.   25 tablet   3   . olmesartan (BENICAR) 40 MG tablet   Oral   Take 40 mg by mouth daily. Takes in mid afternoon         . pregabalin (LYRICA) 300 MG capsule   Oral   Take 1 capsule (300 mg total) by mouth 2 (two) times daily.   60 capsule   6   . traZODone (DESYREL) 50 MG tablet      1-2 tabs po qhs for insomnia   30 tablet   0   . zolpidem (AMBIEN) 5 MG tablet   Oral   Take 1 tablet (5 mg total) by mouth at bedtime as needed for sleep.   30 tablet   1    BP 118/55  Pulse 92  Temp(Src) 98.1 F (36.7 C) (Oral)  Resp 13  SpO2 100%  Physical Exam  Nursing note and vitals reviewed. Constitutional: She is oriented to person, place, and time. She appears well-developed. No distress.  HENT:  Head: Normocephalic and atraumatic.  Mouth/Throat: Oropharynx is clear and moist. No oropharyngeal exudate.  Eyes: Conjunctivae and EOM are normal. Pupils are equal, round, and reactive to light. No scleral icterus.  Neck: Normal range of  motion.  Cardiovascular: Normal rate, regular rhythm and normal heart sounds.    Pulmonary/Chest: Effort normal and breath sounds normal.  Decreased, but adequate, air movement in all lung fields. No wheezes, rales, or rhonchi on auscultation.  Abdominal: Soft. There is no tenderness. There is no rebound and no guarding.  Musculoskeletal: Normal range of motion.  Neurological: She is oriented to person, place, and time.  Skin: Skin is warm and dry. No rash noted. She is not diaphoretic. No erythema. No pallor.  Psychiatric: She has a normal mood and affect. Her behavior is normal.    ED Course  Procedures (including critical care time) Labs Reviewed  CBC - Abnormal; Notable for the following:    Hemoglobin 11.2 (*)    HCT 35.2 (*)    RDW 16.2 (*)    All other components within normal limits  COMPREHENSIVE METABOLIC PANEL - Abnormal; Notable for the following:    Potassium 3.1 (*)    Total Bilirubin 0.1 (*)    GFR calc non Af Amer 88 (*)    All other components within normal limits  PRO B NATRIURETIC PEPTIDE - Abnormal; Notable for the following:    Pro B Natriuretic peptide (BNP) 214.8 (*)    All other components within normal limits  TROPONIN I   Dg Chest Port 1 View  07/12/2013   *RADIOLOGY REPORT*  Clinical Data: Shortness of breath.  History of asbestos damage to lungs.  History of hypertension.  PORTABLE CHEST - 1 VIEW  Comparison: 03/15/2013 chest x-ray and 03/16/2013 chest CT  Findings: Heart size is mildly enlarged.  Pleural calcifications are again noted.  No new consolidation or pleural effusion identified.  No pulmonary edema.  IMPRESSION:  1.  Cardiomegaly without pulmonary edema. 2.  Multiple pleural calcifications, stable in appearance.   Original Report Authenticated By: Norva Pavlov, M.D.    Date: 07/12/2013  Rate: 93  Rhythm: normal sinus rhythm  QRS Axis: normal  Intervals: normal  ST/T Wave abnormalities: nonspecific ST changes  Conduction Disutrbances:right bundle branch block  Narrative Interpretation: NSR with RBBB; no STEMI or  ischemic changes  Old EKG Reviewed: unchanged from 01/31/2013 I have personally reviewed and interpreted this EKG   1. Shortness of breath   2. Hypoxia     MDM  Patient with hx of asbestosis presents for worsening SOB and audible wheezing. Hypoxic at PCP office and given 2 neb tx as well as neb tx x 2 by EMS. Patient tx in ED with nebs x 2, Solumedrol, and MgSO4. Troponin <0.30 and BNP only mildly elevated to 214. No leukocytosis and liver and kidney function preserved. CXR without evidence of pneumonia, PTX, pleural effusions, or other acute cardiopulmonary changes. Patient's audible wheeze unchanged from arrival; breath sounds decreased b/l R>L. Have been able to decrease O2 from 3L [place] to 2L; however, patient not on oxygen chronically. Patient also gets increasingly dyspneic when speaking in sentences. Will consult hospitalist for admission for hypoxia and SOB.  Dr. Isidoro Donning to admit. Temp admit orders placed.           Antony Madura, PA-C 07/12/13 [phone]

## 2013-07-12 NOTE — ED Provider Notes (Deleted)
History    CSN: [id number] Arrival date & time 07/12/13  1311  First MD Initiated Contact with Patient 07/12/13 1327     Chief Complaint  Patient presents with  . Shortness of Breath   (Consider location/radiation/quality/duration/timing/severity/associated sxs/prior Treatment) HPI Comments: Patient is a 67 y/o female with an extensive PMH including asbestosis who presents for SOB x 3 days, worsening over the last 48 hours. Patient states SOB is worse when exerting herself or speaking for long periods of time; states she is only able to speak a few words at a time. Symptoms have been associated with a dry, nonproductive cough, audible wheezes, and an aching pain in her b/l legs and low back. She also admits to associated right sided chest pain yesterday which radiated to her R arm and R jaw. Patient states she took SL NTG x 3 with relief of chest pain; chest pain associated with diaphoresis and nausea. Patient denies emesis. She further denies fevers, syncope, vision changes, abdominal pain, urinary symptoms, and extremity weakness.   PCP - Dr. Milinda Cave of [place]. Patient sent to ED from PCP office for further evaluation of symptoms. On arrival to PCP, patient satting 89% on RA. Given neb tx x 2 as well as another 2 neb tx in route to ED with sats improving to 97%. Patient denies use of home O2.  The history is provided by the patient. No language interpreter was used.    Past Surgical History  Procedure Laterality Date  . Appendectomy  1960  . Total abdominal hysterectomy  1974  . Tendon release  1996    Right forearm and hand  . Knee surgery  2005  . Heel spur surgery  2008    left foot  . Plantar fascia release  2008    left  . Axillary surgery  1978    Multiple "lump" in armpit per pt  . Coccyx removal  1972  . Cardiac catheterization  01/2013    nonobstructive CAD, EF 55-60%  . Transthoracic echocardiogram  01/2013    NORMAL   Family History  Problem Relation Age of Onset   . Arthritis Mother   . Kidney disease Mother   . Heart disease Father   . Stroke Father   . Hypertension Father   . Diabetes Father    History  Substance Use Topics  . Smoking status: Never Smoker   . Smokeless tobacco: Never Used  . Alcohol Use: No   OB History   Grav Para Term Preterm Abortions TAB SAB Ect Mult Living                 Review of Systems Constitutional: Negative for fever.  Respiratory: Positive for cough, shortness of breath and wheezing.  Cardiovascular: Positive for chest pain and leg swelling (L foot/ankle).  Gastrointestinal: Positive for nausea. Negative for vomiting and abdominal pain.  Musculoskeletal: Positive for myalgias and back pain.  Neurological: Negative for syncope and numbness.  All other systems reviewed and are negative.   Allergies  Penicillins  Home Medications   Current Outpatient Rx  Name  Route  Sig  Dispense  Refill  . albuterol (PROVENTIL HFA;VENTOLIN HFA) 108 (90 BASE) MCG/ACT inhaler   Inhalation   Inhale 2 puffs into the lungs every 6 (six) hours as needed for wheezing. For shortness of breath         . ALPRAZolam (XANAX) 1 MG tablet      1 mg. 1 tab in AM and Noon  and 1/2 tab in PM and Bedtime.         [name] Kitchen amitriptyline (ELAVIL) 100 MG tablet   Oral   Take 50 mg by mouth at bedtime.         [name] Kitchen aspirin EC 81 MG EC tablet   Oral   Take 1 tablet (81 mg total) by mouth daily.   30 tablet   3   . atorvastatin (LIPITOR) 20 MG tablet   Oral   Take 1 tablet (20 mg total) by mouth daily at 6 PM.   30 tablet   6   . budesonide-formoterol (SYMBICORT) 160-4.5 MCG/ACT inhaler   Inhalation   Inhale 2 puffs into the lungs 2 (two) times daily.         . clopidogrel (PLAVIX) 75 MG tablet   Oral   Take 1 tablet (75 mg total) by mouth daily with breakfast.   30 tablet   3   . hydrochlorothiazide (HYDRODIURIL) 25 MG tablet   Oral   Take 1 tablet (25 mg total) by mouth daily.   90 tablet   3   . meloxicam  (MOBIC) 7.5 MG tablet   Oral   Take 7.5 mg by mouth 2 (two) times daily.         . metoprolol succinate (TOPROL-XL) 100 MG 24 hr tablet   Oral   Take 1 tablet (100 mg total) by mouth daily. Take with or immediately following a meal.   90 tablet   3   . Milnacipran (SAVELLA) 50 MG TABS   Oral   Take 1 tablet (50 mg total) by mouth 2 (two) times daily.   60 tablet   3   . Multiple Vitamins-Minerals (CENTRUM SILVER ADULT 50+ PO)   Oral   Take 1 tablet by mouth daily.         . nitroGLYCERIN (NITROSTAT) 0.4 MG SL tablet   Sublingual   Place 1 tablet (0.4 mg total) under the tongue every 5 (five) minutes x 3 doses as needed for chest pain.   25 tablet   3   . olmesartan (BENICAR) 40 MG tablet   Oral   Take 40 mg by mouth daily. Takes in mid afternoon         . pregabalin (LYRICA) 300 MG capsule   Oral   Take 1 capsule (300 mg total) by mouth 2 (two) times daily.   60 capsule   6   . traZODone (DESYREL) 50 MG tablet      1-2 tabs po qhs for insomnia   30 tablet   0   . zolpidem (AMBIEN) 5 MG tablet   Oral   Take 1 tablet (5 mg total) by mouth at bedtime as needed for sleep.   30 tablet   1    BP 155/130  Pulse 98  Temp(Src) 98.1 F (36.7 C) (Oral)  Resp 17  SpO2 97% Physical Exam Nursing note and vitals reviewed.  Constitutional: She is oriented to person, place, and time. She appears well-developed. No distress.  HENT:  Head: Normocephalic and atraumatic.  Mouth/Throat: Oropharynx is clear and moist. No oropharyngeal exudate.  Eyes: Conjunctivae and EOM are normal. Pupils are equal, round, and reactive to light. No scleral icterus.  Neck: Normal range of motion.  Cardiovascular: Normal rate, regular rhythm and normal heart sounds.  Pulmonary/Chest: Effort normal and breath sounds normal.  Decreased, but adequate, air movement in all lung fields. No wheezes, rales, or rhonchi on auscultation.  Abdominal: Soft. There is no tenderness. There is no  rebound and no guarding.  Musculoskeletal: Normal range of motion.  Neurological: She is oriented to person, place, and time.  Skin: Skin is warm and dry. No rash noted. She is not diaphoretic. No erythema. No pallor.  Psychiatric: She has a normal mood and affect. Her behavior is normal.    ED Course  Procedures (including critical care time) Labs Reviewed  CBC - Abnormal; Notable for the following:    Hemoglobin 11.2 (*)    HCT 35.2 (*)    RDW 16.2 (*)    All other components within normal limits  COMPREHENSIVE METABOLIC PANEL - Abnormal; Notable for the following:    Potassium 3.1 (*)    Total Bilirubin 0.1 (*)    GFR calc non Af Amer 88 (*)    All other components within normal limits  PRO B NATRIURETIC PEPTIDE - Abnormal; Notable for the following:    Pro B Natriuretic peptide (BNP) 214.8 (*)    All other components within normal limits  TROPONIN I  D-DIMER, QUANTITATIVE   Dg Chest Port 1 View  07/12/2013   *RADIOLOGY REPORT*  Clinical Data: Shortness of breath.  History of asbestos damage to lungs.  History of hypertension.  PORTABLE CHEST - 1 VIEW  Comparison: 03/15/2013 chest x-ray and 03/16/2013 chest CT  Findings: Heart size is mildly enlarged.  Pleural calcifications are again noted.  No new consolidation or pleural effusion identified.  No pulmonary edema.  IMPRESSION:  1.  Cardiomegaly without pulmonary edema. 2.  Multiple pleural calcifications, stable in appearance.   Original Report Authenticated By: Norva Pavlov, M.D.    MDM  Patient with hx of asbestosis presents for worsening SOB and audible wheezing. Hypoxic at PCP office and given 2 neb tx as well as neb tx x 2 by EMS. Patient tx in ED with nebs x 2, Solumedrol, and MgSO4. Troponin <0.30 and BNP only mildly elevated to 214. No leukocytosis and liver and kidney function preserved. CXR without evidence of pneumonia, PTX, pleural effusions, or other acute cardiopulmonary changes. Patient's audible wheeze unchanged  from arrival; breath sounds decreased b/l R>L. Have been able to decrease O2 from 3L [place] to 2L; however, patient not on oxygen chronically. Patient also gets increasingly dyspneic when speaking in sentences. Will consult hospitalist for admission for hypoxia and SOB.   Dr. Isidoro Donning to admit. Temp admit orders placed.   Antony Madura, PA-C 07/12/13 [phone]

## 2013-07-12 NOTE — Assessment & Plan Note (Signed)
Severe, minimal improvement with albuterol/atrovent 2.5/0.5 neb x 2 in office today. Oxygen sat borderline at best (85-93%). EMS transport called to take pt to [facility] ED for further evaluation and management. Pt left office in stable condition.

## 2013-07-12 NOTE — ED Notes (Signed)
Pt was brought from [place] with a c/o sob and wheezing. Pt recently had bronchopneumonia about a month ago. She reported having a non productive cough that started yesterday as well as leg pain, swelling in legs and feet and lower back pain. When pt presented to [facility] spO2 was 89%, pt was given 2 neb tx at facility. Pt received 2 more neb tx en route O2 sats improved to 97%. Pt also states she had some cp, sweating, and n/v yesterday took 3 nitros and pain went away. Pt was given 4mg  Zofran IM en route.

## 2013-07-12 NOTE — Progress Notes (Signed)
New admit, pt a/o x 4, pt assist x 1, pt DOE, PRN xanax given for anxiety, pt oriented to unit, pt stable, on 3L O2, will continue to monitor

## 2013-07-12 NOTE — H&P (Signed)
History and Physical       Hospital Admission Note Date: 07/12/2013  Patient name: Linda [name] Medical record number: [id number] Date of birth: [birth year] Age: 67 y.o. Gender: female PCP: Jeoffrey Massed, MD    Chief Complaint:  Shortness of breath with wheezing since last night  HPI: Patient is a 67 year old female with history of asbestosis, asthma, hypertension, hyperlipidemia was sent from PCPs office for shortness of breath and wheezing. History was obtained from the patient who did have difficulty completing sentences due to shortness of breath and wheezing. Patient reported that she started having shortness of breath and wheezing since last night which continued to get progressively worse. She did have slight right-sided chest pain yesterday and took sublingual nitroglycerin which resolved the chest pain. She denied any fever or chills. Typically, patient uses Symbicort and rescue inhaler as needed however last night she she had used her albuterol inhaler twice. In the ER patient received Solu-Medrol and albuterol nebulizer treatment with mild relief.  Review of Systems:  Constitutional: Denies fever, chills, diaphoresis, +poor appetite and fatigue.  HEENT: Denies photophobia, eye pain, redness, hearing loss, ear pain, congestion, sore throat, rhinorrhea, sneezing, mouth sores, trouble swallowing, neck pain, neck stiffness and tinnitus.   Respiratory:  please see history of present illness. Patient has a history of asbestosis.  Cardiovascular: Denies chest pain, palpitations and leg swelling.  Gastrointestinal: Denies nausea, vomiting, abdominal pain, diarrhea, constipation, blood in stool and abdominal distention.  Genitourinary: Denies dysuria, urgency, frequency, hematuria, flank pain and difficulty urinating.  Musculoskeletal: Denies myalgias, back pain, joint swelling, arthralgias and gait problem.  Skin: Denies pallor,  rash and wound.  Neurological: Denies dizziness, seizures, syncope, weakness, light-headedness, numbness and headaches.  Hematological: Denies adenopathy. Easy bruising, personal or family bleeding history  Psychiatric/Behavioral: Denies suicidal ideation, mood changes, confusion, nervousness, sleep disturbance and agitation  Past Medical History: Past Medical History  Diagnosis Date  . HTN (hypertension)   . Depression   . Anxiety     with panic attacks  . DDD (degenerative disc disease), lumbar   . Osteoarthritis   . DDD (degenerative disc disease), cervical   . OSA (obstructive sleep apnea)   . Asthma     + asbestososis (father worked in shipyard with asbestos.  . Recurrent UTI     +hx of hospitalization for pyelonephritis  . Migraine syndrome   . Nephrolithiasis   . Hay fever   . Iron deficiency anemia     Hematologist in [place], [place] did extensive w/u; no cause found; failed oral supplement;; gets fairly regular (q70m or so) IV iron infusions (Venofer) 200mg  with procrit  . Mixed incontinence urge and stress   . Diverticular disease   . Insomnia   . Fibromyalgia     Patient states dx was around her late 3s but she had sx's for years prior to this.  . History of pneumonia     hospitalized 12/2011 for this  . Syncope     +Hypotensive; ED visit--Dr. Sharyn Lull did Cath--nonobstructive CAD, EF 55-60%  . Iron deficiency anemia, unspecified 06/28/2013  . Idiopathic angio-edema-urticaria 40981    Angioedema component was very minimal   Past Surgical History  Procedure Laterality Date  . Appendectomy  1960  . Total abdominal hysterectomy  1974  . Tendon release  1996    Right forearm and hand  . Knee surgery  2005  . Heel spur surgery  2008    left foot  . Plantar fascia release  2008  left  . Axillary surgery  1978    Multiple "lump" in armpit per pt  . Coccyx removal  1972  . Cardiac catheterization  01/2013    nonobstructive CAD, EF 55-60%  . Transthoracic  echocardiogram  01/2013    NORMAL    Medications: Prior to Admission medications   Medication Sig Start Date End Date Taking? Authorizing Provider  albuterol (PROVENTIL HFA;VENTOLIN HFA) 108 (90 BASE) MCG/ACT inhaler Inhale 2 puffs into the lungs every 6 (six) hours as needed for wheezing. For shortness of breath   Yes Historical Provider, MD  ALPRAZolam (XANAX) 1 MG tablet 1 mg. 1 tab in AM and Noon and 1/2 tab in PM and Bedtime. 04/22/13  Yes Jeoffrey Massed, MD  amitriptyline (ELAVIL) 100 MG tablet Take 50 mg by mouth at bedtime. 04/22/13  Yes Jeoffrey Massed, MD  aspirin EC 81 MG EC tablet Take 1 tablet (81 mg total) by mouth daily. 01/31/13  Yes Robynn Pane, MD  atorvastatin (LIPITOR) 20 MG tablet Take 1 tablet (20 mg total) by mouth daily at 6 PM. 06/28/13  Yes Jeoffrey Massed, MD  budesonide-formoterol (SYMBICORT) 160-4.5 MCG/ACT inhaler Inhale 2 puffs into the lungs 2 (two) times daily.   Yes Historical Provider, MD  clopidogrel (PLAVIX) 75 MG tablet Take 1 tablet (75 mg total) by mouth daily with breakfast. 01/31/13  Yes Robynn Pane, MD  hydrochlorothiazide (HYDRODIURIL) 25 MG tablet Take 1 tablet (25 mg total) by mouth daily. 04/14/13  Yes Jeoffrey Massed, MD  meloxicam (MOBIC) 7.5 MG tablet Take 7.5 mg by mouth 2 (two) times daily.   Yes Historical Provider, MD  metoprolol succinate (TOPROL-XL) 100 MG 24 hr tablet Take 1 tablet (100 mg total) by mouth daily. Take with or immediately following a meal. 04/14/13  Yes Jeoffrey Massed, MD  Milnacipran (SAVELLA) 50 MG TABS Take 1 tablet (50 mg total) by mouth 2 (two) times daily. 03/25/13  Yes Jeoffrey Massed, MD  Multiple Vitamins-Minerals (CENTRUM SILVER ADULT 50+ PO) Take 1 tablet by mouth daily.   Yes Historical Provider, MD  nitroGLYCERIN (NITROSTAT) 0.4 MG SL tablet Place 1 tablet (0.4 mg total) under the tongue every 5 (five) minutes x 3 doses as needed for chest pain. 01/31/13  Yes Robynn Pane, MD  olmesartan (BENICAR) 40 MG tablet  Take 40 mg by mouth daily. Takes in mid afternoon 06/28/13  Yes Jeoffrey Massed, MD  pregabalin (LYRICA) 300 MG capsule Take 1 capsule (300 mg total) by mouth 2 (two) times daily. 03/10/13  Yes Jeoffrey Massed, MD  traZODone (DESYREL) 50 MG tablet 1-2 tabs po qhs for insomnia 06/21/13  Yes Jeoffrey Massed, MD  zolpidem (AMBIEN) 5 MG tablet Take 1 tablet (5 mg total) by mouth at bedtime as needed for sleep. 07/05/13  Yes Jeoffrey Massed, MD    Allergies:   Allergies  Allergen Reactions  . Penicillins Itching, Swelling and Rash    Social History:  reports that she has never smoked. She has never used smokeless tobacco. She reports that she does not drink alcohol or use illicit drugs.  Family History: Family History  Problem Relation Age of Onset  . Arthritis Mother   . Kidney disease Mother   . Heart disease Father   . Stroke Father   . Hypertension Father   . Diabetes Father     Physical Exam: Blood pressure 155/130, pulse 98, temperature 98.1 F (36.7 C), temperature source Oral, resp.  rate 17, SpO2 97.00%. General: Alert, awake, oriented x3,in moderate distress, difficulty completing sentences, audible wheezing  HEENT: normocephalic, atraumatic, anicteric sclera, pink conjunctiva, pupils equal and reactive to light and accomodation, oropharynx clear Neck: supple, no masses or lymphadenopathy, no goiter, no bruits  Heart: Regular rate and rhythm, without murmurs, rubs or gallops. Lungs:Diffusely decreased breath sounds with audible wheezing  Abdomen: Soft, nontender, nondistended, positive bowel sounds, no masses. Extremities: No clubbing, cyanosis or edema with positive pedal pulses. Neuro: Grossly intact, no focal neurological deficits, strength 5/5 upper and lower extremities bilaterally Psych: alert and oriented x 3, normal mood and affect Skin: no rashes or lesions, warm and dry   LABS on Admission:  Basic Metabolic Panel:  Recent Labs Lab 07/12/13 1330  NA 142  K  3.1*  CL 104  CO2 26  GLUCOSE 97  BUN 20  CREATININE 0.69  CALCIUM 9.2   Liver Function Tests:  Recent Labs Lab 07/12/13 1330  AST 25  ALT 23  ALKPHOS 79  BILITOT 0.1*  PROT 6.8  ALBUMIN 3.5   No results found for this basename: LIPASE, AMYLASE,  in the last 168 hours No results found for this basename: AMMONIA,  in the last 168 hours CBC:  Recent Labs Lab 07/12/13 1330  WBC 10.5  HGB 11.2*  HCT 35.2*  MCV 87.6  PLT 178   Cardiac Enzymes:  Recent Labs Lab 07/12/13 1330  TROPONINI <0.30   BNP: No components found with this basename: POCBNP,  CBG: No results found for this basename: GLUCAP,  in the last 168 hours   Radiological Exams on Admission: Dg Chest Port 1 View  07/12/2013   *RADIOLOGY REPORT*  Clinical Data: Shortness of breath.  History of asbestos damage to lungs.  History of hypertension.  PORTABLE CHEST - 1 VIEW  Comparison: 03/15/2013 chest x-ray and 03/16/2013 chest CT  Findings: Heart size is mildly enlarged.  Pleural calcifications are again noted.  No new consolidation or pleural effusion identified.  No pulmonary edema.  IMPRESSION:  1.  Cardiomegaly without pulmonary edema. 2.  Multiple pleural calcifications, stable in appearance.   Original Report Authenticated By: Norva Pavlov, M.D.    Assessment/Plan Principal Problem:   Acute hypoxic respiratory failure with acute asthma exacerbation with a history of underlying asbestosis: Not on home O2 - Admit to telemetry, will also obtain d-dimer, troponin negative, BNP 214, chest x-ray negative for any pneumonia or pulmonary edema - Placed on scheduled Xopenex and Atrovent nebs, Pulmicort, Solu-Medrol, Levaquin IV, Mucinex, O2 supplementation, incentive spirometry - Patient will likely benefit from pulmonology consult if not improving, she recently moved from [place] where she was followed by pulmonology.  Active Problems:   HTN (hypertension), benign - Restart home medications     Fibromyalgia syndrome: patient also reports chronic low back pain with leg pain  - Continue Norco as needed, Lyrica, meloxicam, Dilaudid as needed - Outpatient followup by PCP, may benefit from out-pt MRI of the lumbar spine to rule out any spinal stenosis   DVT prophylaxis: Lovenox  CODE STATUS: Discussed in detail with the patient and she opted to be full CODE STATUS, okay to intubate if her respiratory status deteriorates  Further plan will depend as patient's clinical course evolves and further radiologic and laboratory data become available.   Time Spent on Admission: 1 hour  Markeesha Char M.D. [facility] 07/12/2013, 4:43 PM Pager: [phone]  If 7PM-7AM, please contact night-coverage [email or web address] Password [email or web address]

## 2013-07-12 NOTE — Progress Notes (Signed)
OFFICE NOTE  07/12/2013  CC:  Chief Complaint  Patient presents with  . Shortness of Breath     HPI: Patient is a 67 y.o. Caucasian female who is here for SOB and wheezing. Onset yesterday.  +Cough.  Has not had albuterol today, just symbicort.  She used her albuterol inhaler 2 times last night. She just recently finished a course of systemic steroids for idiopathic urticaria. No recent URI sx's.  ROS: no chest pain, no rash currently, no swelling of eyes, lips, or tongue   Pertinent PMH:  Past medical, surgical, social, and family history reviewed and no changes noted since last office visit.  MEDS:  Outpatient Prescriptions Prior to Visit  Medication Sig Dispense Refill  . albuterol (PROVENTIL HFA;VENTOLIN HFA) 108 (90 BASE) MCG/ACT inhaler Inhale 2 puffs into the lungs every 6 (six) hours as needed for wheezing. For shortness of breath      . ALPRAZolam (XANAX) 1 MG tablet 1 mg. 1 tab in AM and Noon and 1/2 tab in PM and Bedtime.      [name] Kitchen amitriptyline (ELAVIL) 100 MG tablet Take 50 mg by mouth at bedtime.      [name] Kitchen aspirin EC 81 MG EC tablet Take 1 tablet (81 mg total) by mouth daily.  30 tablet  3  . atorvastatin (LIPITOR) 20 MG tablet Take 1 tablet (20 mg total) by mouth daily at 6 PM.  30 tablet  6  . budesonide-formoterol (SYMBICORT) 160-4.5 MCG/ACT inhaler Inhale 2 puffs into the lungs 2 (two) times daily.      . clopidogrel (PLAVIX) 75 MG tablet Take 1 tablet (75 mg total) by mouth daily with breakfast.  30 tablet  3  . hydrochlorothiazide (HYDRODIURIL) 25 MG tablet Take 1 tablet (25 mg total) by mouth daily.  90 tablet  3  . meloxicam (MOBIC) 7.5 MG tablet Take 7.5 mg by mouth 2 (two) times daily.      . metoprolol succinate (TOPROL-XL) 100 MG 24 hr tablet Take 1 tablet (100 mg total) by mouth daily. Take with or immediately following a meal.  90 tablet  3  . Milnacipran (SAVELLA) 50 MG TABS Take 1 tablet (50 mg total) by mouth 2 (two) times daily.  60 tablet  3  . Multiple  Vitamins-Minerals (CENTRUM SILVER ADULT 50+ PO) Take 1 tablet by mouth daily.      . nitroGLYCERIN (NITROSTAT) 0.4 MG SL tablet Place 1 tablet (0.4 mg total) under the tongue every 5 (five) minutes x 3 doses as needed for chest pain.  25 tablet  3  . olmesartan (BENICAR) 40 MG tablet Take 40 mg by mouth daily. Takes in mid afternoon      . predniSONE (DELTASONE) 20 MG tablet 2 tabs po qd x 7d, then 1 tab po qd x 7d, then 1/2 tab po qd x 6d  24 tablet  0  . pregabalin (LYRICA) 300 MG capsule Take 1 capsule (300 mg total) by mouth 2 (two) times daily.  60 capsule  6  . traZODone (DESYREL) 50 MG tablet 1-2 tabs po qhs for insomnia  30 tablet  0  . zolpidem (AMBIEN) 5 MG tablet Take 1 tablet (5 mg total) by mouth at bedtime as needed for sleep.  30 tablet  1   No facility-administered medications prior to visit.    PE: Blood pressure 149/77, pulse 114, temperature 98.4 F (36.9 C), temperature source Temporal, resp. rate 22, SpO2 92.00%. Oxygen sat 85-93% on RA before, during, and  after her bronchodilator treatments today. Gen: alert, mild distress working to breath, cannot talk in more than 2 word sentences. ENT: no icteris, eye drainge or swelling, or injection.  Oral cavity and oropharynx without erythema, exudate, or swelling. Supraclavicular notch retractions. CV: Tachy to 120, regular, no mumur LUNGS: diffusely decreased aeration, particularly on exhalation.  Prolonged exp phase with end exp coarse wheezing and post-exhalation coughing.  RR about 22 EXT: no clubbing, cyanosis, or edema.    IMPRESSION AND PLAN:  Asthma with acute exacerbation Severe, minimal improvement with albuterol/atrovent 2.5/0.5 neb x 2 in office today. Oxygen sat borderline at best (85-93%). EMS transport called to take pt to [facility] ED for further evaluation and management. Pt left office in stable condition.   FOLLOW UP:  prn

## 2013-07-13 ENCOUNTER — Encounter (HOSPITAL_COMMUNITY): Payer: Self-pay | Admitting: Radiology

## 2013-07-13 ENCOUNTER — Inpatient Hospital Stay (HOSPITAL_COMMUNITY): Payer: Medicare Other

## 2013-07-13 DIAGNOSIS — I6529 Occlusion and stenosis of unspecified carotid artery: Secondary | ICD-10-CM | POA: Diagnosis not present

## 2013-07-13 DIAGNOSIS — J45901 Unspecified asthma with (acute) exacerbation: Secondary | ICD-10-CM | POA: Diagnosis not present

## 2013-07-13 DIAGNOSIS — Z79899 Other long term (current) drug therapy: Secondary | ICD-10-CM

## 2013-07-13 DIAGNOSIS — IMO0001 Reserved for inherently not codable concepts without codable children: Secondary | ICD-10-CM

## 2013-07-13 DIAGNOSIS — R0602 Shortness of breath: Secondary | ICD-10-CM | POA: Diagnosis not present

## 2013-07-13 DIAGNOSIS — J9600 Acute respiratory failure, unspecified whether with hypoxia or hypercapnia: Secondary | ICD-10-CM | POA: Diagnosis not present

## 2013-07-13 DIAGNOSIS — I10 Essential (primary) hypertension: Secondary | ICD-10-CM

## 2013-07-13 DIAGNOSIS — M2550 Pain in unspecified joint: Secondary | ICD-10-CM

## 2013-07-13 LAB — CBC
HCT: 31 % — ABNORMAL LOW (ref 36.0–46.0)
Hemoglobin: 10.3 g/dL — ABNORMAL LOW (ref 12.0–15.0)
MCV: 87.1 fL (ref 78.0–100.0)
Platelets: 178 10*3/uL (ref 150–400)
RBC: 3.56 MIL/uL — ABNORMAL LOW (ref 3.87–5.11)
WBC: 11.2 10*3/uL — ABNORMAL HIGH (ref 4.0–10.5)

## 2013-07-13 LAB — BASIC METABOLIC PANEL
CO2: 23 mEq/L (ref 19–32)
Calcium: 9.2 mg/dL (ref 8.4–10.5)
Chloride: 105 mEq/L (ref 96–112)
Glucose, Bld: 162 mg/dL — ABNORMAL HIGH (ref 70–99)
Potassium: 3.6 mEq/L (ref 3.5–5.1)
Sodium: 142 mEq/L (ref 135–145)

## 2013-07-13 LAB — BLOOD GAS, ARTERIAL
Drawn by: 307971
O2 Content: 3 L/min
Patient temperature: 98.6
TCO2: 23.6 mmol/L (ref 0–100)
pH, Arterial: 7.432 (ref 7.350–7.450)

## 2013-07-13 LAB — TROPONIN I
Troponin I: <0.3 ng/mL (ref <0.30)
Troponin I: <0.3 ng/mL (ref <0.30)

## 2013-07-13 MED ORDER — CHLORDIAZEPOXIDE HCL 5 MG PO CAPS
5.0000 mg | ORAL_CAPSULE | Freq: Two times a day (BID) | ORAL | Status: DC
  Administered 2013-07-13 – 2013-07-14 (×2): 5 mg via ORAL
  Filled 2013-07-13 (×2): qty 1

## 2013-07-13 MED ORDER — MORPHINE SULFATE 2 MG/ML IJ SOLN
2.0000 mg | INTRAMUSCULAR | Status: DC | PRN
  Administered 2013-07-18: 2 mg via INTRAVENOUS
  Filled 2013-07-13: qty 1

## 2013-07-13 MED ORDER — SUMATRIPTAN SUCCINATE 6 MG/0.5ML ~~LOC~~ SOLN
6.0000 mg | Freq: Once | SUBCUTANEOUS | Status: AC
  Administered 2013-07-13: 6 mg via SUBCUTANEOUS
  Filled 2013-07-13: qty 0.5

## 2013-07-13 MED ORDER — IOHEXOL 350 MG/ML SOLN
100.0000 mL | Freq: Once | INTRAVENOUS | Status: AC | PRN
  Administered 2013-07-13: 100 mL via INTRAVENOUS

## 2013-07-13 NOTE — Progress Notes (Signed)
Report given to receiving RN. No questions or concerns at this time. Patient is stable and appears in no acute distress or discomfort.  

## 2013-07-13 NOTE — Progress Notes (Signed)
UR Completed.  Linda [name] [phone] 07/13/2013

## 2013-07-13 NOTE — Progress Notes (Signed)
[facility] PROGRESS NOTE  Linda [name] ZOX:[id number] DOB: [birth year] DOA: 07/12/2013 PCP: Jeoffrey Massed, MD  Assessment/Plan: 1. Acute hypoxic respiratory failure; patient SpO2= 94% on nasal cannula of 3 L O2 troponin x3 negative BNP= to 14.8, d-dimer= 1.28 we'll need to obtain CT chest rule out PE. Continue scheduled Xopenex and Atrovent nebs, Pulmicort, Solu-Medrol. Continue Levaquin IV. Morphine available when necessary for feeling of air hunger, ,   2. HTN; continue home medications   3. Fibromyalgia syndrome: Large doses of narcotics are contraindicated in fibromyalgia patients will DC Dilaudid continue patient on Norco, meloxicam, Lyrica:   4. Polysubstance abuse; will DC Xanax is extremely addictive specimen and this group. Will start patient on chlordiazepoxide 5 mg twice a day,(may increase to 3 times a day for anxiety)  5. Headache; Imitrex     Code Status: Full Family Communication: Daughter and patient understand care Disposition Plan:    Consultants:    Procedures:  CXR 07/12/2013 1. Cardiomegaly without pulmonary edema.  2. Multiple pleural calcifications, stable in appearance.    Antibiotics:  Levofloxacin day 1/7    HPI/Subjective: Patient is a 67 year old female with history of asbestosis, asthma, hypertension, hyperlipidemia was sent from PCPs office for shortness of breath and wheezing. History was obtained from the patient who did have difficulty completing sentences due to shortness of breath and wheezing. Patient reported that she started having shortness of breath and wheezing since last night which continued to get progressively worse. She did have slight right-sided chest pain yesterday and took sublingual nitroglycerin which resolved the chest pain. She denied any fever or chills. Typically, patient uses Symbicort and rescue inhaler as needed however last night she she had used her albuterol inhaler twice.  In the ER patient received  Solu-Medrol and albuterol nebulizer treatment with mild relief.   Objective: Filed Vitals:   07/13/13 0300 07/13/13 0554 07/13/13 0750 07/13/13 1200  BP:  147/66    Pulse:  100    Temp:  97.6 F (36.4 C)    TempSrc:  Oral    Resp:  20    Height:      Weight:  79.425 kg (175 lb 1.6 oz)    SpO2: 95% 96% 96% 94%    Intake/Output Summary (Last 24 hours) at 07/13/13 1318 Last data filed at 07/13/13 1156  Gross per 24 hour  Intake    240 ml  Output    875 ml  Net   -635 ml   Filed Weights   07/12/13 1714 07/13/13 0554  Weight: 79.4 kg (175 lb 0.7 oz) 79.425 kg (175 lb 1.6 oz)    Exam:   General: Alert, in moderate distress secondary to feeling of air hunger  Cardiovascular: Regular rhythm, tachycardic, negative murmurs rubs gallops  Respiratory: Patient has diffuse rhonchi right> left, with expiratory wheezing pronounced in the right poor air movement in the right lower lobe  Abdomen: Soft nontender nondistended plus bowel sounds  Data Reviewed: Basic Metabolic Panel:  Recent Labs Lab 07/12/13 1330 07/12/13 1752 07/13/13 0450  NA 142  --  142  K 3.1*  --  3.6  CL 104  --  105  CO2 26  --  23  GLUCOSE 97  --  162*  BUN 20  --  13  CREATININE 0.69 0.55 0.54  CALCIUM 9.2  --  9.2   Liver Function Tests:  Recent Labs Lab 07/12/13 1330  AST 25  ALT 23  ALKPHOS 79  BILITOT 0.1*  PROT  6.8  ALBUMIN 3.5   No results found for this basename: LIPASE, AMYLASE,  in the last 168 hours No results found for this basename: AMMONIA,  in the last 168 hours CBC:  Recent Labs Lab 07/12/13 1330 07/12/13 1752 07/13/13 0450  WBC 10.5 12.3* 11.2*  HGB 11.2* 10.5* 10.3*  HCT 35.2* 31.8* 31.0*  MCV 87.6 86.2 87.1  PLT 178 179 178   Cardiac Enzymes:  Recent Labs Lab 07/12/13 1330 07/12/13 1752 07/12/13 2333 07/13/13 0450  TROPONINI <0.30 <0.30 <0.30 <0.30   BNP (last 3 results)  Recent Labs  07/12/13 1330  PROBNP 214.8*   CBG: No results found for  this basename: GLUCAP,  in the last 168 hours  No results found for this or any previous visit (from the past 240 hour(s)).   Studies: Dg Chest Port 1 View  07/12/2013   *RADIOLOGY REPORT*  Clinical Data: Shortness of breath.  History of asbestos damage to lungs.  History of hypertension.  PORTABLE CHEST - 1 VIEW  Comparison: 03/15/2013 chest x-ray and 03/16/2013 chest CT  Findings: Heart size is mildly enlarged.  Pleural calcifications are again noted.  No new consolidation or pleural effusion identified.  No pulmonary edema.  IMPRESSION:  1.  Cardiomegaly without pulmonary edema. 2.  Multiple pleural calcifications, stable in appearance.   Original Report Authenticated By: Norva Pavlov, M.D.    Scheduled Meds: . amitriptyline  50 mg Oral QHS  . aspirin EC  81 mg Oral Daily  . atorvastatin  20 mg Oral q1800  . budesonide  0.25 mg Nebulization BID  . clopidogrel  75 mg Oral Q breakfast  . enoxaparin (LOVENOX) injection  40 mg Subcutaneous Q24H  . guaiFENesin  600 mg Oral BID  . hydrochlorothiazide  25 mg Oral Daily  . levalbuterol  0.63 mg Nebulization Q4H   And  . ipratropium  0.5 mg Nebulization Q4H  . levofloxacin (LEVAQUIN) IV  750 mg Intravenous Q24H  . meloxicam  7.5 mg Oral BID WC  . methylPREDNISolone (SOLU-MEDROL) injection  60 mg Intravenous Q6H  . metoprolol succinate  100 mg Oral Daily  . Milnacipran  50 mg Oral BID  . pantoprazole  40 mg Oral Daily  . pregabalin  300 mg Oral BID  . sodium chloride  3 mL Intravenous Q12H   Continuous Infusions:   Principal Problem:   Acute hypoxic respiratory failure Active Problems:   HTN (hypertension), benign   Fibromyalgia syndrome   Asthma with acute exacerbation    Time spent: 35 minutes    WOODS, CURTIS, J  [facility] Pager [phone]. If 7PM-7AM, please contact night-coverage at [email or web address], password [facility] 07/13/2013, 1:18 PM  LOS: 1 day

## 2013-07-14 DIAGNOSIS — E119 Type 2 diabetes mellitus without complications: Secondary | ICD-10-CM | POA: Diagnosis not present

## 2013-07-14 DIAGNOSIS — E7800 Pure hypercholesterolemia, unspecified: Secondary | ICD-10-CM | POA: Diagnosis not present

## 2013-07-14 DIAGNOSIS — M7989 Other specified soft tissue disorders: Secondary | ICD-10-CM

## 2013-07-14 DIAGNOSIS — J45901 Unspecified asthma with (acute) exacerbation: Secondary | ICD-10-CM | POA: Diagnosis not present

## 2013-07-14 DIAGNOSIS — R0602 Shortness of breath: Secondary | ICD-10-CM | POA: Diagnosis not present

## 2013-07-14 DIAGNOSIS — J9600 Acute respiratory failure, unspecified whether with hypoxia or hypercapnia: Secondary | ICD-10-CM | POA: Diagnosis not present

## 2013-07-14 MED ORDER — LEVALBUTEROL HCL 0.63 MG/3ML IN NEBU
0.6300 mg | INHALATION_SOLUTION | Freq: Two times a day (BID) | RESPIRATORY_TRACT | Status: DC
  Administered 2013-07-14 – 2013-07-19 (×10): 0.63 mg via RESPIRATORY_TRACT
  Filled 2013-07-14 (×18): qty 3

## 2013-07-14 MED ORDER — LEVOFLOXACIN 750 MG PO TABS
750.0000 mg | ORAL_TABLET | Freq: Every day | ORAL | Status: DC
  Administered 2013-07-14 – 2013-07-19 (×6): 750 mg via ORAL
  Filled 2013-07-14 (×6): qty 1

## 2013-07-14 MED ORDER — TEMAZEPAM 15 MG PO CAPS
15.0000 mg | ORAL_CAPSULE | Freq: Every day | ORAL | Status: DC
  Administered 2013-07-14 – 2013-07-18 (×5): 15 mg via ORAL
  Filled 2013-07-14 (×5): qty 1

## 2013-07-14 MED ORDER — METHYLPREDNISOLONE SODIUM SUCC 40 MG IJ SOLR
40.0000 mg | Freq: Two times a day (BID) | INTRAMUSCULAR | Status: DC
  Administered 2013-07-14 (×2): 40 mg via INTRAVENOUS
  Filled 2013-07-14 (×5): qty 1

## 2013-07-14 MED ORDER — FUROSEMIDE 10 MG/ML IJ SOLN
40.0000 mg | Freq: Every day | INTRAMUSCULAR | Status: DC
  Administered 2013-07-14: 40 mg via INTRAVENOUS
  Filled 2013-07-14 (×2): qty 4

## 2013-07-14 MED ORDER — ALPRAZOLAM 0.5 MG PO TABS
0.5000 mg | ORAL_TABLET | Freq: Three times a day (TID) | ORAL | Status: DC | PRN
  Administered 2013-07-14 – 2013-07-19 (×8): 0.5 mg via ORAL
  Filled 2013-07-14 (×8): qty 1

## 2013-07-14 MED ORDER — IPRATROPIUM BROMIDE 0.02 % IN SOLN
0.5000 mg | Freq: Two times a day (BID) | RESPIRATORY_TRACT | Status: DC
  Administered 2013-07-14 – 2013-07-19 (×10): 0.5 mg via RESPIRATORY_TRACT
  Filled 2013-07-14 (×10): qty 2.5

## 2013-07-14 NOTE — Progress Notes (Signed)
Pt. Refused ambulation in hallway.

## 2013-07-14 NOTE — Progress Notes (Addendum)
[facility] PROGRESS NOTE  Linda [name] NWG:[id number] DOB: 1946/05/12 DOA: 07/12/2013 PCP: Jeoffrey Massed, MD  Assessment/Plan: Principal Problem:   Acute hypoxic respiratory failure Active Problems:   HTN (hypertension), benign   Fibromyalgia syndrome   Polypharmacy   Asthma with acute exacerbation    Assessment/Plan:  1. Acute hypoxic respiratory failure; patient SpO2= 94% on nasal cannula of 3 L O2 troponin x3 negative BNP= to 14.8, d-dimer= 1.28 , CT scan negative for pulmonary embolism Continue scheduled Xopenex and Atrovent nebs, Pulmicort, taper Solu-Medrol. Switch Levaquin to by mouth. Morphine available when necessary for feeling of air hunger, , still requiring 3 L of oxygen, component of congestive heart failure, or cardiac cath in February 2014, most recent EF is 55-60% Discussed with Dr Sharyn Lull , patient may have catheter-induced mitral regurgitation and needs to follow up with him in an outpatient setting Will check Doppler of bilateral lower extremities to rule out DVT   2. HTN; continue home medications   3. Fibromyalgia syndrome: Large doses of narcotics are contraindicated in fibromyalgia patients will DC Dilaudid continue patient on Norco, meloxicam, Lyrica:  4. Polysubstance abuse; will DC Xanax is extremely addictive specimen and this group. Will start patient on chlordiazepoxide 5 mg twice a day,(may increase to 3 times a day for anxiety)  5. Headache; Imitrex    Code Status: Full  Family Communication: Daughter and patient understand care  Disposition Plan: Anticipate discharge tomorrow  Consultants:  Procedures:  CXR 07/12/2013 1. Cardiomegaly without pulmonary edema.  2. Multiple pleural calcifications, stable in appearance.  Antibiotics:  Levofloxacin day 1/7  HPI/Subjective:  Patient is a 67 year old female with history of asbestosis, asthma, hypertension, hyperlipidemia was sent from PCPs office for shortness of breath and wheezing.  History was obtained from the patient who did have difficulty completing sentences due to shortness of breath and wheezing. Patient reported that she started having shortness of breath and wheezing since last night which continued to get progressively worse. She did have slight right-sided chest pain yesterday and took sublingual nitroglycerin which resolved the chest pain. She denied any fever or chills. Typically, patient uses Symbicort and rescue inhaler as needed however last night she she had used her albuterol inhaler twice.  In the ER patient received Solu-Medrol and albuterol nebulizer treatment with mild relief.   Objective: Filed Vitals:   07/13/13 1550 07/13/13 2000 07/13/13 2211 07/14/13 0423  BP:   174/82 168/72  Pulse:   94 96  Temp:   97.9 F (36.6 C) 98 F (36.7 C)  TempSrc:   Oral Oral  Resp:   18 18  Height:      Weight:    77.7 kg (171 lb 4.8 oz)  SpO2: 98% 94% 99% 98%    Intake/Output Summary (Last 24 hours) at 07/14/13 0803 Last data filed at 07/13/13 1840  Gross per 24 hour  Intake    520 ml  Output   1000 ml  Net   -480 ml    Exam:  HENT:  Head: Atraumatic.  Nose: Nose normal.  Mouth/Throat: Oropharynx is clear and moist.  Eyes: Conjunctivae are normal. Pupils are equal, round, and reactive to light. No scleral icterus.  Neck: Neck supple. No tracheal deviation present.  Cardiovascular: Normal rate, regular rhythm, normal heart sounds and intact distal pulses.  Pulmonary/Chest: Effort normal and breath sounds normal. No respiratory distress.  Abdominal: Soft. Normal appearance and bowel sounds are normal. She exhibits no distension. There is no tenderness.  Musculoskeletal: She  exhibits no edema and no tenderness.  Neurological: She is alert. No cranial nerve deficit.    Data Reviewed: Basic Metabolic Panel:  Recent Labs Lab 07/12/13 1330 07/12/13 1752 07/13/13 0450  NA 142  --  142  K 3.1*  --  3.6  CL 104  --  105  CO2 26  --  23  GLUCOSE  97  --  162*  BUN 20  --  13  CREATININE 0.69 0.55 0.54  CALCIUM 9.2  --  9.2    Liver Function Tests:  Recent Labs Lab 07/12/13 1330  AST 25  ALT 23  ALKPHOS 79  BILITOT 0.1*  PROT 6.8  ALBUMIN 3.5   No results found for this basename: LIPASE, AMYLASE,  in the last 168 hours No results found for this basename: AMMONIA,  in the last 168 hours  CBC:  Recent Labs Lab 07/12/13 1330 07/12/13 1752 07/13/13 0450  WBC 10.5 12.3* 11.2*  HGB 11.2* 10.5* 10.3*  HCT 35.2* 31.8* 31.0*  MCV 87.6 86.2 87.1  PLT 178 179 178    Cardiac Enzymes:  Recent Labs Lab 07/12/13 1330 07/12/13 1752 07/12/13 2333 07/13/13 0450  TROPONINI <0.30 <0.30 <0.30 <0.30   BNP (last 3 results)  Recent Labs  07/12/13 1330  PROBNP 214.8*     CBG: No results found for this basename: GLUCAP,  in the last 168 hours  No results found for this or any previous visit (from the past 240 hour(s)).   Studies: Ct Angio Chest Pe W/cm &/or Wo Cm  07/13/2013   *RADIOLOGY REPORT*  Clinical Data: Acute onset of shortness of breath with elevated D- dimer.  Known history of prior asbestos exposure.  CT ANGIOGRAPHY CHEST  Technique:  Multidetector CT imaging of the chest using the standard protocol during bolus administration of intravenous contrast. Multiplanar reconstructed images including MIPs were obtained and reviewed to evaluate the vascular anatomy.  Contrast: OMNIPAQUE IOHEXOL 350 MG/ML SOLN  Comparison: Prior CTA of the chest performed at [facility] on 03/16/2013.  Findings: The pulmonary arteries are well opacified.  There is no evidence of acute pulmonary embolism.  Lungs show increase in pulmonary vascularity since the prior study with mild ground-glass airspace disease noted in both upper lung zones.  Findings are suggestive of a component of interstitial edema/CHF.  A trace amount of pleural fluid is present on the left.  Again noted are a multitude of calcified pleural plaques  bilaterally with associated areas of pleural thickening.  Findings are consistent with prior asbestos exposure.  Mildly prominent hilar and mediastinal lymph node tissue is again noted which may be reactive.  Calcified plaque is identified in the distribution of the LAD and left circumflex coronary artery.  The heart is mildly enlarged.  No pericardial fluid is seen.  IMPRESSION:  1.  No evidence of acute pulmonary embolism. 2.  Probable component of interstitial edema/CHF with associated cardiomegaly. 3.  Evidence of coronary atherosclerosis with calcified plaque present in the distribution of the LAD and left circumflex coronary artery. 4.  Stable multitude of calcified pleural plaques bilaterally consistent with prior asbestos exposure.   Original Report Authenticated By: Irish Lack, M.D.   Dg Chest Port 1 View  07/12/2013   *RADIOLOGY REPORT*  Clinical Data: Shortness of breath.  History of asbestos damage to lungs.  History of hypertension.  PORTABLE CHEST - 1 VIEW  Comparison: 03/15/2013 chest x-ray and 03/16/2013 chest CT  Findings: Heart size is mildly enlarged.  Pleural calcifications are again noted.  No new consolidation or pleural effusion identified.  No pulmonary edema.  IMPRESSION:  1.  Cardiomegaly without pulmonary edema. 2.  Multiple pleural calcifications, stable in appearance.   Original Report Authenticated By: Norva Pavlov, M.D.    Scheduled Meds: . amitriptyline  50 mg Oral QHS  . aspirin EC  81 mg Oral Daily  . atorvastatin  20 mg Oral q1800  . budesonide  0.25 mg Nebulization BID  . chlordiazePOXIDE  5 mg Oral BID  . clopidogrel  75 mg Oral Q breakfast  . enoxaparin (LOVENOX) injection  40 mg Subcutaneous Q24H  . guaiFENesin  600 mg Oral BID  . hydrochlorothiazide  25 mg Oral Daily  . levalbuterol  0.63 mg Nebulization Q4H   And  . ipratropium  0.5 mg Nebulization Q4H  . levofloxacin  750 mg Oral Daily  . meloxicam  7.5 mg Oral BID WC  . methylPREDNISolone  (SOLU-MEDROL) injection  40 mg Intravenous Q12H  . metoprolol succinate  100 mg Oral Daily  . Milnacipran  50 mg Oral BID  . pantoprazole  40 mg Oral Daily  . pregabalin  300 mg Oral BID  . sodium chloride  3 mL Intravenous Q12H   Continuous Infusions:   Principal Problem:   Acute hypoxic respiratory failure Active Problems:   HTN (hypertension), benign   Fibromyalgia syndrome   Polypharmacy   Asthma with acute exacerbation    Time spent: 40 minutes   [facility]  [facility] Pager [phone]. If 8PM-8AM, please contact night-coverage at [email or web address], password [facility] 07/14/2013, 8:03 AM  LOS: 2 days

## 2013-07-14 NOTE — Progress Notes (Signed)
Patient received 2 of Nitro and chest pain has subsided. Will continue to monitor patient for further changes in condition.

## 2013-07-14 NOTE — Progress Notes (Signed)
Patient evaluated for community based chronic disease management services with [facility] Care Management Program as a benefit of patient's [employer]. Patient has declined services at this time, but will discuss them with her family.  Spoke with patient at bedside to explain [facility] Care Management services. Left contact information and [facility] literature at bedside. Made inpatient Case Manager aware that [facility] Care Management following. Of note, [facility] Care Management services does not replace or interfere with any services that are arranged by inpatient case management or social work.  For additional questions or referrals please contact Anibal Henderson BSN RN [facility] Liaison at [phone].

## 2013-07-14 NOTE — Progress Notes (Signed)
Report given to receiving RN. No questions or concerns at this time. Patient is stable and appears in no acute distress or discomfort.  

## 2013-07-14 NOTE — Progress Notes (Signed)
*  PRELIMINARY RESULTS* Vascular Ultrasound Lower extremity venous duplex has been completed.  Preliminary findings:  Negative for DVT    Farrel Demark, RDMS, RVT  07/14/2013, 3:47 PM

## 2013-07-14 NOTE — ED Provider Notes (Signed)
Medical screening examination/treatment/procedure(s) were conducted as a shared visit with non-physician practitioner(s) and myself.  I personally evaluated the patient during the encounter Pt with hx copd, presents w wheezing/sob.  After several nebs, wheezing persists, although less dyspneic and breathing easier.  Will admit.   Suzi Roots, MD 07/14/13 [phone]

## 2013-07-14 NOTE — Progress Notes (Signed)
During patient's test, it was reported to me that the patient was having chest pain rating a 6 out of 10. Patient arrived on the unit and a 12 lead EKG was performed, O2 was applied at 2L, BP 155/81 and HR 82. MD made aware. MD followed up with patient. New orders given. Will continue to monitor patient for further changes in condition.

## 2013-07-15 DIAGNOSIS — E7800 Pure hypercholesterolemia, unspecified: Secondary | ICD-10-CM | POA: Diagnosis not present

## 2013-07-15 DIAGNOSIS — J45901 Unspecified asthma with (acute) exacerbation: Secondary | ICD-10-CM | POA: Diagnosis not present

## 2013-07-15 DIAGNOSIS — R0602 Shortness of breath: Secondary | ICD-10-CM | POA: Diagnosis not present

## 2013-07-15 DIAGNOSIS — J9600 Acute respiratory failure, unspecified whether with hypoxia or hypercapnia: Secondary | ICD-10-CM | POA: Diagnosis not present

## 2013-07-15 DIAGNOSIS — E119 Type 2 diabetes mellitus without complications: Secondary | ICD-10-CM | POA: Diagnosis not present

## 2013-07-15 LAB — BASIC METABOLIC PANEL
BUN: 28 mg/dL — ABNORMAL HIGH (ref 6–23)
CO2: 31 mEq/L (ref 19–32)
Chloride: 98 mEq/L (ref 96–112)
Creatinine, Ser: 0.68 mg/dL (ref 0.50–1.10)
GFR calc Af Amer: >90 mL/min (ref >90)

## 2013-07-15 MED ORDER — POLYETHYLENE GLYCOL 3350 17 G PO PACK
17.0000 g | PACK | Freq: Every day | ORAL | Status: DC | PRN
  Administered 2013-07-16: 17 g via ORAL
  Filled 2013-07-15 (×3): qty 1

## 2013-07-15 MED ORDER — FUROSEMIDE 10 MG/ML IJ SOLN
40.0000 mg | Freq: Two times a day (BID) | INTRAMUSCULAR | Status: DC
  Administered 2013-07-15 – 2013-07-16 (×3): 40 mg via INTRAVENOUS
  Filled 2013-07-15 (×4): qty 4

## 2013-07-15 MED ORDER — POTASSIUM CHLORIDE CRYS ER 20 MEQ PO TBCR
40.0000 meq | EXTENDED_RELEASE_TABLET | Freq: Three times a day (TID) | ORAL | Status: AC
  Administered 2013-07-15 (×3): 40 meq via ORAL
  Filled 2013-07-15 (×3): qty 2

## 2013-07-15 MED ORDER — PREDNISONE 20 MG PO TABS
40.0000 mg | ORAL_TABLET | Freq: Every day | ORAL | Status: DC
  Administered 2013-07-16 – 2013-07-19 (×4): 40 mg via ORAL
  Filled 2013-07-15 (×5): qty 2

## 2013-07-15 NOTE — Evaluation (Signed)
Physical Therapy Evaluation Patient Details Name: Linda [name] MRN: [id number] DOB: 1946/12/02 Today's Date: 07/15/2013 Time: 1914-7829 PT Time Calculation (min): 28 min  PT Assessment / Plan / Recommendation History of Present Illness  pt adm with SOB, wheezing and poor saturations ( acute hypoxic Resp. Failure.  SOB has been assoc with CP relived by nitro.  Clinical Impression  Pt admitted with acute hypoxic resp failure. And on eval pt unable to ambulate due to unable to catch her breath enough. Pt currently with functional limitations due to the deficits listed below (see PT Problem List).  Pt will benefit from skilled PT to improve her activity tolerance, increase herr independence and safety with mobility to allow discharge to next venue that may change based on pt progress and family's ability to help given husband has just been through surgery at [name].     PT Assessment  Patient needs continued PT services    Follow Up Recommendations  Home health PT    Does the patient have the potential to tolerate intense rehabilitation      Barriers to Discharge Other (comment) (may have difficulty getting help for home after d/c)      Equipment Recommendations  Other (comment) (TBA before D/C home)    Recommendations for Other Services     Frequency Min 3X/week    Precautions / Restrictions     Pertinent Vitals/Pain Sats at rest on 3L Linda [name] 95-97%, HR 75, after transfer sats 97%, EHR 85bpm      Mobility  Bed Mobility Bed Mobility: Sit to Supine;Supine to Sit;Sitting - Scoot to Edge of Bed Supine to Sit: 5: Supervision;HOB flat Sitting - Scoot to Edge of Bed: 5: Supervision Sit to Supine: 5: Supervision Transfers Transfers: Sit to Stand;Stand to [employer] Transfers Sit to Stand: 5: Supervision;From bed;From chair/3-in-1 Stand to Sit: 5: Supervision;To bed;To chair/3-in-1 Stand Pivot Transfers: 5: Supervision Details for Transfer Assistance: safe  mobility Ambulation/Gait Ambulation/Gait Assistance: Not tested (comment);Other (comment) (too SOB) Wheelchair Mobility Wheelchair Mobility: No    Exercises     PT Diagnosis: Difficulty walking;Generalized weakness  PT Problem List: Decreased strength;Decreased activity tolerance;Decreased mobility;Cardiopulmonary status limiting activity PT Treatment Interventions: Gait training;DME instruction;Stair training;Therapeutic activities;Therapeutic exercise;Patient/family education     PT Goals(Current goals can be found in the care plan section) Acute Rehab PT Goals Patient Stated Goal: My husband is my primary focus, but I need to get home. PT Goal Formulation: With patient Time For Goal Achievement: 07/29/13 Potential to Achieve Goals: Good  Visit Information  Last PT Received On: 07/15/13 Assistance Needed: +1 History of Present Illness: pt adm with SOB, wheezing and poor saturations.  SOB has been assoc with CP relived by nitro.       Prior Functioning  Home Living Family/patient expects to be discharged to:: Private residence Living Arrangements: Spouse/significant other Available Help at Discharge: Other (Comment) (TBA family is spread out caring for pt and her husband in [place]) Type of Home: House Home Access: Stairs to enter [employer] of Steps: 3/8 Entrance Stairs-Rails: Right;Left Home Layout: One level Prior Function Level of Independence: Independent Communication Communication: No difficulties    Cognition  Cognition Arousal/Alertness: Awake/alert Behavior During Therapy: WFL for tasks assessed/performed Overall Cognitive Status: Within Functional Limits for tasks assessed    Extremity/Trunk Assessment Lower Extremity Assessment Lower Extremity Assessment: Generalized weakness;Overall Linda [name] LP for tasks assessed   Balance Balance Balance Assessed: Yes Static Sitting Balance Static Sitting - Balance Support: Feet supported;No upper extremity  supported Static  Sitting - Level of Assistance: 7: Independent  End of Session PT - End of Session Activity Tolerance: Patient limited by fatigue;Other (comment) (and SOB) Patient left: in bed Nurse Communication: Mobility status  GP     Mafalda Mcginniss, Eliseo Gum 07/15/2013, 5:23 PM 07/15/2013   [place], PT [phone]  (pager)

## 2013-07-15 NOTE — Progress Notes (Signed)
Report given to receiving RN. No questions or concerns at this time. Patient is stable and appears in no acute distress or discomfort.  

## 2013-07-15 NOTE — Progress Notes (Signed)
[facility] PROGRESS NOTE  SHELLIA HARTL JYN:[id number] DOB: 1946-05-10 DOA: 07/12/2013 PCP: Jeoffrey Massed, MD  Assessment/Plan: Principal Problem:   Acute hypoxic respiratory failure Active Problems:   HTN (hypertension), benign   Fibromyalgia syndrome   Polypharmacy   Asthma with acute exacerbation    Assessment/Plan:  1. Acute hypoxic respiratory failure; patient SpO2= 94% on nasal cannula of 3 L O2 troponin x3 negative BNP= to 14.8, d-dimer= 1.28 , CT scan negative for pulmonary embolism Continue scheduled Xopenex and Atrovent nebs, Pulmicort, taper Solu-Medrol. Switch Levaquin to by mouth. Morphine available when necessary for feeling of air hunger, , still requiring 3 L of oxygen, component of congestive heart failure, or cardiac cath in February 2014, most recent EF is 55-60%  Discussed with Dr Sharyn Lull , patient may have catheter-induced mitral regurgitation and needs to follow up with him in an outpatient setting  Doppler negative for DVT, after discussion with cardiology the patient was initiated on Lasix yesterday, had to 2250 cc out, continue diuresis today Discussed plan of care with the son by the bedside  2. HTN; continue home medications  3. Fibromyalgia syndrome: Large doses of narcotics are contraindicated in fibromyalgia patients will DC Dilaudid continue patient on Norco, meloxicam, Lyrica:  4. Polysubstance abuse; resume Xanax low dose and discontinued Librium 5. Headache; Imitrex  6. Hypokalemia replete  Code Status: Full  Family Communication: Son by the bedside Disposition Plan: PT/OT evaluation    Consultants:  Procedures:  CXR 07/12/2013 1. Cardiomegaly without pulmonary edema.  2. Multiple pleural calcifications, stable in appearance.  Antibiotics:  Levofloxacin day 1/7  HPI/Subjective:  Patient is a 67 year old female with history of asbestosis, asthma, hypertension, hyperlipidemia was sent from PCPs office for shortness of breath and  wheezing. History was obtained from the patient who did have difficulty completing sentences due to shortness of breath and wheezing. Patient reported that she started having shortness of breath and wheezing since last night which continued to get progressively worse. She did have slight right-sided chest pain yesterday and took sublingual nitroglycerin which resolved the chest pain. She denied any fever or chills. Typically, patient uses Symbicort and rescue inhaler as needed however last night she she had used her albuterol inhaler twice.  In the ER patient received Solu-Medrol and albuterol nebulizer treatment with mild relief.      HPI/Subjective: Complaint of chest pain yesterday however repeat troponin and workup was negative EKG unchanged Chest pain-free today, still has some wheezing, potassium low this morning Extremely short of breath with ambulation  Objective: Filed Vitals:   07/14/13 2023 07/14/13 2141 07/15/13 0544 07/15/13 0804  BP:  168/78 167/80   Pulse: 85 82 74   Temp:  97.8 F (36.6 C) 97.2 F (36.2 C)   TempSrc:  Oral Oral   Resp: 18 18 18    Height:      Weight:   77.111 kg (170 lb)   SpO2: 95% 96% 95% 98%    Intake/Output Summary (Last 24 hours) at 07/15/13 0824 Last data filed at 07/14/13 2130  Gross per 24 hour  Intake    703 ml  Output   2250 ml  Net  -1547 ml    Exam:  HENT:  Head: Atraumatic.  Nose: Nose normal.  Mouth/Throat: Oropharynx is clear and moist.  Eyes: Conjunctivae are normal. Pupils are equal, round, and reactive to light. No scleral icterus.  Neck: Neck supple. No tracheal deviation present.  Cardiovascular: Normal rate, regular rhythm, normal heart sounds and intact  distal pulses.  Pulmonary/Chest: Effort normal and breath sounds normal. No respiratory distress.  Abdominal: Soft. Normal appearance and bowel sounds are normal. She exhibits no distension. There is no tenderness.  Musculoskeletal: She exhibits no edema and no  tenderness.  Neurological: She is alert. No cranial nerve deficit.    Data Reviewed: Basic Metabolic Panel:  Recent Labs Lab 07/12/13 1330 07/12/13 1752 07/13/13 0450 07/15/13 0515  NA 142  --  142 140  K 3.1*  --  3.6 2.8*  CL 104  --  105 98  CO2 26  --  23 31  GLUCOSE 97  --  162* 162*  BUN 20  --  13 28*  CREATININE 0.69 0.55 0.54 0.68  CALCIUM 9.2  --  9.2 9.2    Liver Function Tests:  Recent Labs Lab 07/12/13 1330  AST 25  ALT 23  ALKPHOS 79  BILITOT 0.1*  PROT 6.8  ALBUMIN 3.5   No results found for this basename: LIPASE, AMYLASE,  in the last 168 hours No results found for this basename: AMMONIA,  in the last 168 hours  CBC:  Recent Labs Lab 07/12/13 1330 07/12/13 1752 07/13/13 0450  WBC 10.5 12.3* 11.2*  HGB 11.2* 10.5* 10.3*  HCT 35.2* 31.8* 31.0*  MCV 87.6 86.2 87.1  PLT 178 179 178    Cardiac Enzymes:  Recent Labs Lab 07/12/13 1330 07/12/13 1752 07/12/13 2333 07/13/13 0450 07/14/13 1305  TROPONINI <0.30 <0.30 <0.30 <0.30 <0.30   BNP (last 3 results)  Recent Labs  07/12/13 1330  PROBNP 214.8*     CBG: No results found for this basename: GLUCAP,  in the last 168 hours  No results found for this or any previous visit (from the past 240 hour(s)).   Studies: Ct Angio Chest Pe W/cm &/or Wo Cm  07/13/2013   *RADIOLOGY REPORT*  Clinical Data: Acute onset of shortness of breath with elevated D- dimer.  Known history of prior asbestos exposure.  CT ANGIOGRAPHY CHEST  Technique:  Multidetector CT imaging of the chest using the standard protocol during bolus administration of intravenous contrast. Multiplanar reconstructed images including MIPs were obtained and reviewed to evaluate the vascular anatomy.  Contrast: OMNIPAQUE IOHEXOL 350 MG/ML SOLN  Comparison: Prior CTA of the chest performed at [facility] on 03/16/2013.  Findings: The pulmonary arteries are well opacified.  There is no evidence of acute pulmonary  embolism.  Lungs show increase in pulmonary vascularity since the prior study with mild ground-glass airspace disease noted in both upper lung zones.  Findings are suggestive of a component of interstitial edema/CHF.  A trace amount of pleural fluid is present on the left.  Again noted are a multitude of calcified pleural plaques bilaterally with associated areas of pleural thickening.  Findings are consistent with prior asbestos exposure.  Mildly prominent hilar and mediastinal lymph node tissue is again noted which may be reactive.  Calcified plaque is identified in the distribution of the LAD and left circumflex coronary artery.  The heart is mildly enlarged.  No pericardial fluid is seen.  IMPRESSION:  1.  No evidence of acute pulmonary embolism. 2.  Probable component of interstitial edema/CHF with associated cardiomegaly. 3.  Evidence of coronary atherosclerosis with calcified plaque present in the distribution of the LAD and left circumflex coronary artery. 4.  Stable multitude of calcified pleural plaques bilaterally consistent with prior asbestos exposure.   Original Report Authenticated By: Irish Lack, M.D.   Dg Chest Port 1  View  07/12/2013   *RADIOLOGY REPORT*  Clinical Data: Shortness of breath.  History of asbestos damage to lungs.  History of hypertension.  PORTABLE CHEST - 1 VIEW  Comparison: 03/15/2013 chest x-ray and 03/16/2013 chest CT  Findings: Heart size is mildly enlarged.  Pleural calcifications are again noted.  No new consolidation or pleural effusion identified.  No pulmonary edema.  IMPRESSION:  1.  Cardiomegaly without pulmonary edema. 2.  Multiple pleural calcifications, stable in appearance.   Original Report Authenticated By: Norva Pavlov, M.D.    Scheduled Meds: . amitriptyline  50 mg Oral QHS  . aspirin EC  81 mg Oral Daily  . atorvastatin  20 mg Oral q1800  . budesonide  0.25 mg Nebulization BID  . clopidogrel  75 mg Oral Q breakfast  . enoxaparin (LOVENOX)  injection  40 mg Subcutaneous Q24H  . furosemide  40 mg Intravenous Daily  . guaiFENesin  600 mg Oral BID  . hydrochlorothiazide  25 mg Oral Daily  . ipratropium  0.5 mg Nebulization BID  . levalbuterol  0.63 mg Nebulization BID  . levofloxacin  750 mg Oral Daily  . meloxicam  7.5 mg Oral BID WC  . metoprolol succinate  100 mg Oral Daily  . Milnacipran  50 mg Oral BID  . pantoprazole  40 mg Oral Daily  . potassium chloride  40 mEq Oral TID  . [START ON 07/16/2013] predniSONE  40 mg Oral Q breakfast  . pregabalin  300 mg Oral BID  . sodium chloride  3 mL Intravenous Q12H  . temazepam  15 mg Oral QHS   Continuous Infusions:   Principal Problem:   Acute hypoxic respiratory failure Active Problems:   HTN (hypertension), benign   Fibromyalgia syndrome   Polypharmacy   Asthma with acute exacerbation    Time spent: 40 minutes   [facility]  [facility] Pager [phone]. If 8PM-8AM, please contact night-coverage at [email or web address], password [facility] 07/15/2013, 8:24 AM  LOS: 3 days

## 2013-07-15 NOTE — Progress Notes (Signed)
Pt. Alert and oriented this am. No s/s of distress or discomfort noted. No c/o pain. Pt. Rested well during the night. RN will continue to monitor pt. For changes in condition. Karryn Kosinski, Cheryll Dessert

## 2013-07-15 NOTE — Progress Notes (Signed)
Patient is complaining of chest pain rating a 4 on a 0-10 scale. O2 is in place, 12 lead EKG in being performed, 1 of Nitro has been given. BP is 151/80 and HR is 76. MD made aware. No new orders given at this time. Will continue to monitor patient for further changes in condition.

## 2013-07-16 DIAGNOSIS — I7090 Unspecified atherosclerosis: Secondary | ICD-10-CM | POA: Diagnosis not present

## 2013-07-16 DIAGNOSIS — R0602 Shortness of breath: Secondary | ICD-10-CM | POA: Diagnosis not present

## 2013-07-16 DIAGNOSIS — E7800 Pure hypercholesterolemia, unspecified: Secondary | ICD-10-CM | POA: Diagnosis not present

## 2013-07-16 DIAGNOSIS — I200 Unstable angina: Secondary | ICD-10-CM | POA: Diagnosis not present

## 2013-07-16 DIAGNOSIS — I10 Essential (primary) hypertension: Secondary | ICD-10-CM | POA: Diagnosis not present

## 2013-07-16 LAB — BASIC METABOLIC PANEL
CO2: 31 mEq/L (ref 19–32)
Calcium: 8.8 mg/dL (ref 8.4–10.5)
Creatinine, Ser: 0.83 mg/dL (ref 0.50–1.10)
Glucose, Bld: 96 mg/dL (ref 70–99)

## 2013-07-16 NOTE — Progress Notes (Signed)
Occupational Therapy Evaluation Patient Details Name: Linda [name] MRN: [id number] DOB: [birth year] Today's Date: 07/16/2013 Time: 4098-1191 OT Time Calculation (min): 35 min  OT Assessment / Plan / Recommendation History of present illness pt adm with SOB, wheezing and poor saturations.  SOB has been assoc with CP relived by nitro.   Clinical Impression   Pt presents with inconsistencies throughout evaluation. Pt talked about her present situation and the guilt she feels about being away from her husband. Pt c/o SOB but no dyspnea noted. O2 SATS 96 RA ambulating to nsg and back. Pt asking for O2 after sitting in chair, however, O2  sats were 97. Pt states that she has anxiety attacks and does not think that she has been receiving her xanax and is very concerned about this. Pt will benefit from skilled OT services to facilitate D/C to next venue due to below deficits.    OT Assessment  Patient needs continued OT Services    Follow Up Recommendations  Home health OT    Barriers to Discharge Decreased caregiver support    Equipment Recommendations  3 in 1 bedside comode    Recommendations for Other Services  SW consult for coping with current situation. Pastoral counseling.  Frequency  Min 2X/week    Precautions / Restrictions Precautions Precautions: Fall Precaution Comments: check O2 Sats Restrictions Weight Bearing Restrictions: No   Pertinent Vitals/Pain O2 95-97 after ambulating RA    ADL  Upper Body Bathing: Set up;Supervision/safety Where Assessed - Upper Body Bathing: Unsupported sitting Lower Body Bathing: Supervision/safety;Set up Where Assessed - Lower Body Bathing: Supported sit to stand Upper Body Dressing: Supervision/safety;Set up Where Assessed - Upper Body Dressing: Unsupported sitting Lower Body Dressing: Supervision/safety;Set up Where Assessed - Lower Body Dressing: Supported sit to stand Toilet Transfer: Minimal assistance Toilet Transfer Method:  Sit to stand Equipment Used: Gait belt Transfers/Ambulation Related to ADLs: Min a ADL Comments: overall set up    OT Diagnosis: Generalized weakness  OT Problem List: Decreased activity tolerance;Decreased knowledge of use of DME or AE;Cardiopulmonary status limiting activity OT Treatment Interventions: Self-care/ADL training;Energy conservation;DME and/or AE instruction;Therapeutic activities;Patient/family education   OT Goals(Current goals can be found in the care plan section) Acute Rehab OT Goals Patient Stated Goal: My husband is my primary focus, but I need to get home. OT Goal Formulation: With patient Time For Goal Achievement: 07/30/13 Potential to Achieve Goals: Good  Visit Information  Last OT Received On: 07/16/13 Assistance Needed: +1 History of Present Illness: pt adm with SOB, wheezing and poor saturations.  SOB has been assoc with CP relived by nitro.       Prior Functioning     Home Living Family/patient expects to be discharged to:: Private residence Living Arrangements: Spouse/significant other Available Help at Discharge: Other (Comment) (TBA family is spread out caring for pt and her husband in [place]) Type of Home: House Home Access: Stairs to enter [employer] of Steps: 3/8 Entrance Stairs-Rails: Right;Left Home Layout: One level Home Equipment: None Prior Function Level of Independence: Independent Communication Communication: No difficulties         Vision/Perception     Cognition  Cognition Arousal/Alertness: Awake/alert Behavior During Therapy: WFL for tasks assessed/performed Overall Cognitive Status: Within Functional Limits for tasks assessed    Extremity/Trunk Assessment Upper Extremity Assessment Upper Extremity Assessment: Overall WFL for tasks assessed Lower Extremity Assessment Lower Extremity Assessment: Overall WFL for tasks assessed     Mobility Bed Mobility Bed Mobility: Sit to Supine;Supine to  Sit;Sitting -  Scoot to Edge of Bed Supine to Sit: 5: Supervision;HOB flat Sitting - Scoot to Edge of Bed: 5: Supervision Sit to Supine: 5: Supervision Transfers Sit to Stand: From bed;From chair/3-in-1;4: Min guard Stand to Sit: To bed;To chair/3-in-1;4: Min assist Details for Transfer Assistance: On descent to chair, pt required increase assistance to sit and control descent     Exercise     Balance Balance Balance Assessed: Yes Static Sitting Balance Static Sitting - Balance Support: Feet supported;No upper extremity supported Static Sitting - Level of Assistance: 7: Independent   End of Session OT - End of Session Equipment Utilized During Treatment: Gait belt Activity Tolerance: Patient limited by fatigue Patient left: in chair;with call bell/phone within reach;with nursing/sitter in room Nurse Communication: Mobility status;Other (comment) (O2 sats)  GO     Linda [name],Linda [name] 07/16/2013, 7:02 PM [facility], OTR/L  [phone] 07/16/2013

## 2013-07-16 NOTE — Progress Notes (Signed)
SATURATION QUALIFICATIONS: (This note is used to comply with regulatory documentation for home oxygen)  Patient Saturations on Room Air at Rest = 93%  Patient Saturations on Room Air while Ambulating = 93-96%

## 2013-07-16 NOTE — Consult Note (Signed)
Reason for Consult: Recurrent chest pain Referring Physician: [facility]  Linda [name] is an 67 y.o. female.  HPI: Patient is 67 year old female with past medical history significant for coronary artery disease patient had cardiac cath in February of 2014 which showed moderate 50-60% focal mid RCA stenosis, hypertension, hypercholesteremia, history of bronchial asthma, history of asbestosis exposure, obstructive sleep apnea, depression, degenerative joint disease, fibromyalgia, diabetes mellitus, depression, hiatus hernia, was admitted on 07/12/2013 because of progressive shortness of breath associated with right-sided weak chest pain off and on. Patient was seen at PMDs office received breathing treatment without improvement in her wheezing and was referred to the ER. Patient was treated for exacerbation of bronchial asthma with improvement in her breathing but continues to have recurrent retrosternal chest pain described as tightness her relief with the sublingual nitroglycerin. Cardiac enzymes so far has been negative. Patient states for last few months she has been feeling tightness heaviness in the chest with minimal exertion her. Denies any nausea vomiting diaphoresis. Denies any palpitation lightheadedness or syncopal episode. EKG showed normal sinus rhythm with right bundle branch block with secondary ST-T wave changes.    Past Surgical History  Procedure Laterality Date  . Appendectomy  1960  . Total abdominal hysterectomy  1974  . Tendon release  1996    Right forearm and hand  . Knee surgery  2005  . Heel spur surgery Left 2008  . Plantar fascia release Left 2008  . Axillary surgery Left 1978    Multiple "lump" in armpit per pt  . Coccyx removal  1972  . Cardiac catheterization  01/2013    nonobstructive CAD, EF 55-60%  . Transthoracic echocardiogram  01/2013    NORMAL  . Dilation and curettage of uterus  ? 1970's  . Eye surgery Left 2012-2013    "injections for ~ 1  yr; don't really know what for" (07/12/2013)    Family History  Problem Relation Age of Onset  . Arthritis Mother   . Kidney disease Mother   . Heart disease Father   . Stroke Father   . Hypertension Father   . Diabetes Father     Social History:  reports that she has never smoked. She has never used smokeless tobacco. She reports that she does not drink alcohol or use illicit drugs.  Allergies:  Allergies  Allergen Reactions  . Penicillins Itching, Swelling and Rash    Medications: I have reviewed the patient's current medications.  Results for orders placed during the hospital encounter of 07/12/13 (from the past 48 hour(s))  TROPONIN I     Status: None   Collection Time    07/14/13  1:05 PM      Result Value Range   Troponin I <0.30  <0.30 ng/mL   Comment:            Due to the release kinetics of cTnI,     a negative result within the first hours     of the onset of symptoms does not rule out     myocardial infarction with certainty.     If myocardial infarction is still suspected,     repeat the test at appropriate intervals.  BASIC METABOLIC PANEL     Status: Abnormal   Collection Time    07/15/13  5:15 AM      Result Value Range   Sodium 140  135 - 145 mEq/L   Potassium 2.8 (*) 3.5 - 5.1 mEq/L   Chloride 98  96 - 112 mEq/L   CO2 31  19 - 32 mEq/L   Glucose, Bld 162 (*) 70 - 99 mg/dL   BUN 28 (*) 6 - 23 mg/dL   Creatinine, Ser 3.08  0.50 - 1.10 mg/dL   Calcium 9.2  8.4 - 65.7 mg/dL   GFR calc non Af Amer 89 (*) >90 mL/min   GFR calc Af Amer >90  >90 mL/min   Comment:            The eGFR has been calculated     using the CKD EPI equation.     This calculation has not been     validated in all clinical     situations.     eGFR's persistently     <90 mL/min signify     possible Chronic Kidney Disease.  BASIC METABOLIC PANEL     Status: Abnormal   Collection Time    07/16/13  5:57 AM      Result Value Range   Sodium 139  135 - 145 mEq/L   Potassium 3.5   3.5 - 5.1 mEq/L   Comment: DELTA CHECK NOTED   Chloride 98  96 - 112 mEq/L   CO2 31  19 - 32 mEq/L   Glucose, Bld 96  70 - 99 mg/dL   BUN 31 (*) 6 - 23 mg/dL   Creatinine, Ser 8.46  0.50 - 1.10 mg/dL   Calcium 8.8  8.4 - 96.2 mg/dL   GFR calc non Af Amer 71 (*) >90 mL/min   GFR calc Af Amer 83 (*) >90 mL/min   Comment:            The eGFR has been calculated     using the CKD EPI equation.     This calculation has not been     validated in all clinical     situations.     eGFR's persistently     <90 mL/min signify     possible Chronic Kidney Disease.    No results found.  Review of Systems  Constitutional: Negative for fever and chills.  Eyes: Negative for blurred vision and double vision.  Cardiovascular: Positive for chest pain. Negative for palpitations, orthopnea and claudication.  Gastrointestinal: Negative for vomiting and abdominal pain.  Genitourinary: Negative for dysuria.  Neurological: Negative for dizziness and headaches.   Blood pressure 144/77, pulse 89, temperature 97.7 F (36.5 C), temperature source Oral, resp. rate 18, height 5\' 4"  (1.626 m), weight 75.887 kg (167 lb 4.8 oz), SpO2 99.00%. Physical Exam  Constitutional: She is oriented to person, place, and time.  HENT:  Head: Normocephalic and atraumatic.  Eyes: Conjunctivae are normal. Pupils are equal, round, and reactive to light. Left eye exhibits no discharge. No scleral icterus.  Neck: Normal range of motion. Neck supple. No JVD present. No tracheal deviation present. No thyromegaly present.  Cardiovascular: Normal rate and regular rhythm.   Murmur (Soft systolic murmur noted no S3 gallop) heard. Respiratory: Effort normal and breath sounds normal. She has no wheezes. She has no rales.  GI: Soft. Bowel sounds are normal. She exhibits no distension. There is no tenderness. There is no rebound and no guarding.  Musculoskeletal: She exhibits no edema and no tenderness.  Neurological: She is alert and  oriented to person, place, and time.    Assessment/Plan: Unstable angina MI ruled out rule out progression of CAD Moderate focal RCA stenosis Resolving exacerbation of bronchial asthma Hypertension Hypercholesteremia Obstructive sleep apnea History of  asbestosis Degenerative joint disease Fibromyalgia Anemia Diabetes matters Depression History of hiatus hernia Plan Continue present management Will schedule for nuclear stress test to evaluate the significance of mid RCA stenosis if positive for ischemia will need a PCI to mid RCA  [facility] 07/16/2013, 12:37 PM

## 2013-07-16 NOTE — Progress Notes (Signed)
Report given to receiving RN. No questions or concerns at this time. Patient is stable and appears in no acute distress or discomfort.  

## 2013-07-16 NOTE — Progress Notes (Signed)
Physical Therapy Treatment Patient Details Name: QUIARA KILLIAN MRN: [id number] DOB: [birth year] Today's Date: 07/16/2013 Time: 1478-2956 PT Time Calculation (min): 30 min  PT Assessment / Plan / Recommendation  History of Present Illness pt adm with SOB, wheezing and poor saturations.  SOB has been assoc with CP relived by nitro.   Clinical Impression Pt continues to c/o SOB and overall fatigue.  Pt did state "I see black dots." during ambulation.  Therefore pt was return to sitting however Sa02 remained 96% on RA throughout session.  Pt very talkative re:  Home situation and disposition.  Pt concerned about caring for husband when husband returns from hospital.     Follow Up Recommendations  Home health PT     Equipment Recommendations  Rolling walker with 5" wheels    Frequency Min 3X/week   Progress towards PT Goals Progress towards PT goals: Progressing toward goals  Plan Current plan remains appropriate    Precautions / Restrictions Restrictions Weight Bearing Restrictions: No   Pertinent Vitals/Pain No c/o pain; Sa02 remained > 95% on RA throughout session; return 3L O2 at end of session from pt receiving 3L O2 when entering room    Mobility  Bed Mobility Bed Mobility: Sit to Supine;Supine to Sit;Sitting - Scoot to Edge of Bed Supine to Sit: 5: Supervision;HOB flat Sitting - Scoot to Edge of Bed: 5: Supervision Transfers Transfers: Sit to Stand;Stand to [employer] Transfers Sit to Stand: 5: Supervision;From bed;From chair/3-in-1 Stand to Sit: 5: Supervision;To bed;To chair/3-in-1 Ambulation/Gait Ambulation/Gait Assistance: 4: Min assist Ambulation Distance (Feet): 100 Feet Assistive device: 1 person hand held assist Ambulation/Gait Assistance Details: (A) to maintain balance with unsteady gait and overall fatigue. Gait Pattern: Step-to pattern;Shuffle Gait velocity: decreased Stairs: No     PT Diagnosis:    PT Problem List:   PT Treatment Interventions:      PT Goals (current goals can now be found in the care plan section) Acute Rehab PT Goals Patient Stated Goal: My husband is my primary focus, but I need to get home. PT Goal Formulation: With patient Time For Goal Achievement: 07/29/13 Potential to Achieve Goals: Good  Visit Information  Last PT Received On: 07/16/13 Assistance Needed: +1 History of Present Illness: pt adm with SOB, wheezing and poor saturations.  SOB has been assoc with CP relived by nitro.    Subjective Data  Subjective: "My husband is at [facility] for major surgery to remove lesions from his lungs."  "We just moved up here to help my son because he was diagnosised with MS." "We just have a lot going on right now." Patient Stated Goal: My husband is my primary focus, but I need to get home.   Cognition  Cognition Arousal/Alertness: Awake/alert Behavior During Therapy: WFL for tasks assessed/performed Overall Cognitive Status: Within Functional Limits for tasks assessed    Balance     End of Session PT - End of Session Equipment Utilized During Treatment: Gait belt;Oxygen (3L) Activity Tolerance: Patient limited by fatigue;Other (comment) Patient left: in chair;with call bell/phone within reach Nurse Communication: Mobility status   GP     Evone Arseneau 07/16/2013, 9:22 AM  Jake Shark, PT DPT [phone]

## 2013-07-16 NOTE — Progress Notes (Signed)
Pt. C/o of mid-sternal chest pain this evening 8/10. Nitro administered x3 and effective. Pain down to 2/10. Pt. Requested PRN pain medication and medication for anxiety. Meds administered as ordered. EKG done. On call for hospitalist made aware. Rapid Response RN notified and here on floor to assess pt. VSS. Pt. Currently in no distress. Family at the pts. Bedside. Pt. States she feels better. RN to continue to monitor pt. For changes in condition. Call light within reach. Pt. To notify RN if chest pain returns. Yahya Boldman, Cheryll Dessert

## 2013-07-16 NOTE — Progress Notes (Signed)
[facility] PROGRESS NOTE  Linda [name] NFA:[id number] DOB: 1946/04/17 DOA: 07/12/2013 PCP: Jeoffrey Massed, MD  Assessment/Plan: Principal Problem:   Acute hypoxic respiratory failure Active Problems:   HTN (hypertension), benign   Fibromyalgia syndrome   Polypharmacy   Asthma with acute exacerbation     Assessment/Plan:  1. Acute hypoxic respiratory failure; patient SpO2= 94% on nasal cannula of 3 L O2 troponin x3 negative BNP= to 14.8, d-dimer= 1.28 , CT scan negative for pulmonary embolism Continue scheduled Xopenex and Atrovent nebs, Pulmicort, taper Solu-Medrol. Switch Levaquin to by mouth. Morphine available when necessary for feeling of air hunger, , still requiring 3 L of oxygen, component of congestive heart failure, or cardiac cath in February 2014, most recent EF is 55-60%  Discussed with Dr Sharyn Lull , patient may have catheter-induced mitral regurgitation and needs to follow up with him in an outpatient setting  Doppler negative for DVT, after discussion with cardiology the patient was initiated on Lasix yesterday, had to 2250 cc out, continue diuresis Discussed plan of care with the son by the bedside  DiscussED with cardiology about patient's recurrent episodes of chest pain may need a stress test prior to discharge Cardiology  To see the patient today  2. HTN; continue home medications  3. Fibromyalgia syndrome: Large doses of narcotics are contraindicated in fibromyalgia patients will DC Dilaudid continue patient on Norco, meloxicam, Lyrica:  4. Polysubstance abuse; resume Xanax low dose and discontinued Librium  5. Headache; Imitrex  6. Hypokalemia replete   Code Status: Full  Family Communication: Son by the bedside  Disposition Plan: PT/OT evaluation   Consultants:  Procedures:  CXR 07/12/2013 1. Cardiomegaly without pulmonary edema.  2. Multiple pleural calcifications, stable in appearance.  Antibiotics:  Levofloxacin day 1/7  HPI/Subjective:   Patient is a 67 year old female with history of asbestosis, asthma, hypertension, hyperlipidemia was sent from PCPs office for shortness of breath and wheezing. History was obtained from the patient who did have difficulty completing sentences due to shortness of breath and wheezing. Patient reported that she started having shortness of breath and wheezing since last night which continued to get progressively worse. She did have slight right-sided chest pain yesterday and took sublingual nitroglycerin which resolved the chest pain. She denied any fever or chills. Typically, patient uses Symbicort and rescue inhaler as needed however last night she she had used her albuterol inhaler twice.  In the ER patient received Solu-Medrol and albuterol nebulizer treatment with mild relief.    HPI/Subjective:  Complaint of chest pain yesterday however repeat troponin and workup was negative EKG unchanged  Chest pain-free today, still has some wheezing,   Objective: Filed Vitals:   07/15/13 2205 07/15/13 2212 07/15/13 2218 07/16/13 0516  BP:  110/63 121/70 138/72  Pulse:  97 94 75  Temp:    97.7 F (36.5 C)  TempSrc:    Oral  Resp:    18  Height:    5\' 4"  (1.626 m)  Weight:    75.887 kg (167 lb 4.8 oz)  SpO2: 92% 97% 97% 99%    Intake/Output Summary (Last 24 hours) at 07/16/13 0839 Last data filed at 07/16/13 0808  Gross per 24 hour  Intake    923 ml  Output   2050 ml  Net  -1127 ml    Exam:  HENT:  Head: Atraumatic.  Nose: Nose normal.  Mouth/Throat: Oropharynx is clear and moist.  Eyes: Conjunctivae are normal. Pupils are equal, round, and reactive to light. No  scleral icterus.  Neck: Neck supple. No tracheal deviation present.  Cardiovascular: Normal rate, regular rhythm, normal heart sounds and intact distal pulses.  Pulmonary/Chest: Effort normal and breath sounds normal. No respiratory distress.  Abdominal: Soft. Normal appearance and bowel sounds are normal. She exhibits no  distension. There is no tenderness.  Musculoskeletal: She exhibits no edema and no tenderness.  Neurological: She is alert. No cranial nerve deficit.    Data Reviewed: Basic Metabolic Panel:  Recent Labs Lab 07/12/13 1330 07/12/13 1752 07/13/13 0450 07/15/13 0515 07/16/13 0557  NA 142  --  142 140 139  K 3.1*  --  3.6 2.8* 3.5  CL 104  --  105 98 98  CO2 26  --  23 31 31   GLUCOSE 97  --  162* 162* 96  BUN 20  --  13 28* 31*  CREATININE 0.69 0.55 0.54 0.68 0.83  CALCIUM 9.2  --  9.2 9.2 8.8    Liver Function Tests:  Recent Labs Lab 07/12/13 1330  AST 25  ALT 23  ALKPHOS 79  BILITOT 0.1*  PROT 6.8  ALBUMIN 3.5   No results found for this basename: LIPASE, AMYLASE,  in the last 168 hours No results found for this basename: AMMONIA,  in the last 168 hours  CBC:  Recent Labs Lab 07/12/13 1330 07/12/13 1752 07/13/13 0450  WBC 10.5 12.3* 11.2*  HGB 11.2* 10.5* 10.3*  HCT 35.2* 31.8* 31.0*  MCV 87.6 86.2 87.1  PLT 178 179 178    Cardiac Enzymes:  Recent Labs Lab 07/12/13 1330 07/12/13 1752 07/12/13 2333 07/13/13 0450 07/14/13 1305  TROPONINI <0.30 <0.30 <0.30 <0.30 <0.30   BNP (last 3 results)  Recent Labs  07/12/13 1330  PROBNP 214.8*     CBG: No results found for this basename: GLUCAP,  in the last 168 hours  No results found for this or any previous visit (from the past 240 hour(s)).   Studies: Ct Angio Chest Pe W/cm &/or Wo Cm  07/13/2013   *RADIOLOGY REPORT*  Clinical Data: Acute onset of shortness of breath with elevated D- dimer.  Known history of prior asbestos exposure.  CT ANGIOGRAPHY CHEST  Technique:  Multidetector CT imaging of the chest using the standard protocol during bolus administration of intravenous contrast. Multiplanar reconstructed images including MIPs were obtained and reviewed to evaluate the vascular anatomy.  Contrast: OMNIPAQUE IOHEXOL 350 MG/ML SOLN  Comparison: Prior CTA of the chest performed at [facility] on 03/16/2013.  Findings: The pulmonary arteries are well opacified.  There is no evidence of acute pulmonary embolism.  Lungs show increase in pulmonary vascularity since the prior study with mild ground-glass airspace disease noted in both upper lung zones.  Findings are suggestive of a component of interstitial edema/CHF.  A trace amount of pleural fluid is present on the left.  Again noted are a multitude of calcified pleural plaques bilaterally with associated areas of pleural thickening.  Findings are consistent with prior asbestos exposure.  Mildly prominent hilar and mediastinal lymph node tissue is again noted which may be reactive.  Calcified plaque is identified in the distribution of the LAD and left circumflex coronary artery.  The heart is mildly enlarged.  No pericardial fluid is seen.  IMPRESSION:  1.  No evidence of acute pulmonary embolism. 2.  Probable component of interstitial edema/CHF with associated cardiomegaly. 3.  Evidence of coronary atherosclerosis with calcified plaque present in the distribution of the LAD and left circumflex  coronary artery. 4.  Stable multitude of calcified pleural plaques bilaterally consistent with prior asbestos exposure.   Original Report Authenticated By: Irish Lack, M.D.   Dg Chest Port 1 View  07/12/2013   *RADIOLOGY REPORT*  Clinical Data: Shortness of breath.  History of asbestos damage to lungs.  History of hypertension.  PORTABLE CHEST - 1 VIEW  Comparison: 03/15/2013 chest x-ray and 03/16/2013 chest CT  Findings: Heart size is mildly enlarged.  Pleural calcifications are again noted.  No new consolidation or pleural effusion identified.  No pulmonary edema.  IMPRESSION:  1.  Cardiomegaly without pulmonary edema. 2.  Multiple pleural calcifications, stable in appearance.   Original Report Authenticated By: Norva Pavlov, M.D.    Scheduled Meds: . amitriptyline  50 mg Oral QHS  . aspirin EC  81 mg Oral Daily  . atorvastatin  20 mg  Oral q1800  . budesonide  0.25 mg Nebulization BID  . clopidogrel  75 mg Oral Q breakfast  . enoxaparin (LOVENOX) injection  40 mg Subcutaneous Q24H  . furosemide  40 mg Intravenous BID  . guaiFENesin  600 mg Oral BID  . hydrochlorothiazide  25 mg Oral Daily  . ipratropium  0.5 mg Nebulization BID  . levalbuterol  0.63 mg Nebulization BID  . levofloxacin  750 mg Oral Daily  . meloxicam  7.5 mg Oral BID WC  . metoprolol succinate  100 mg Oral Daily  . Milnacipran  50 mg Oral BID  . pantoprazole  40 mg Oral Daily  . predniSONE  40 mg Oral Q breakfast  . pregabalin  300 mg Oral BID  . sodium chloride  3 mL Intravenous Q12H  . temazepam  15 mg Oral QHS   Continuous Infusions:   Principal Problem:   Acute hypoxic respiratory failure Active Problems:   HTN (hypertension), benign   Fibromyalgia syndrome   Polypharmacy   Asthma with acute exacerbation    Time spent: 40 minutes   [facility]  [facility] Pager [phone]. If 8PM-8AM, please contact night-coverage at [email or web address], password [facility] 07/16/2013, 8:39 AM  LOS: 4 days

## 2013-07-17 DIAGNOSIS — I7090 Unspecified atherosclerosis: Secondary | ICD-10-CM | POA: Diagnosis not present

## 2013-07-17 DIAGNOSIS — E7800 Pure hypercholesterolemia, unspecified: Secondary | ICD-10-CM | POA: Diagnosis not present

## 2013-07-17 DIAGNOSIS — R0602 Shortness of breath: Secondary | ICD-10-CM | POA: Diagnosis not present

## 2013-07-17 DIAGNOSIS — I10 Essential (primary) hypertension: Secondary | ICD-10-CM | POA: Diagnosis not present

## 2013-07-17 DIAGNOSIS — I200 Unstable angina: Secondary | ICD-10-CM | POA: Diagnosis not present

## 2013-07-17 MED ORDER — METOLAZONE 2.5 MG PO TABS
2.5000 mg | ORAL_TABLET | Freq: Every day | ORAL | Status: DC
  Administered 2013-07-17: 2.5 mg via ORAL
  Filled 2013-07-17 (×2): qty 1

## 2013-07-17 MED ORDER — FUROSEMIDE 40 MG PO TABS
60.0000 mg | ORAL_TABLET | Freq: Every day | ORAL | Status: DC
  Administered 2013-07-17: 60 mg via ORAL
  Filled 2013-07-17: qty 1

## 2013-07-17 MED ORDER — POTASSIUM CHLORIDE CRYS ER 20 MEQ PO TBCR
20.0000 meq | EXTENDED_RELEASE_TABLET | Freq: Two times a day (BID) | ORAL | Status: AC
  Administered 2013-07-17 (×2): 20 meq via ORAL
  Filled 2013-07-17: qty 1

## 2013-07-17 MED ORDER — FUROSEMIDE 10 MG/ML IJ SOLN
40.0000 mg | Freq: Two times a day (BID) | INTRAMUSCULAR | Status: DC
  Administered 2013-07-17: 40 mg via INTRAVENOUS
  Filled 2013-07-17 (×3): qty 4

## 2013-07-17 NOTE — Progress Notes (Signed)
[facility] PROGRESS NOTE  Linda [name]:[id number] DOB: 21-Apr-1946 DOA: 07/12/2013 PCP: Jeoffrey Massed, MD  Assessment/Plan: Principal Problem:   Acute hypoxic respiratory failure Active Problems:   HTN (hypertension), benign   Fibromyalgia syndrome   Polypharmacy   Asthma with acute exacerbation    Assessment/Plan:  1. Acute hypoxic respiratory failure; patient SpO2= 94% on nasal cannula of 3 L O2 troponin x3 negative BNP= to 14.8, d-dimer= 1.28 , CT scan negative for pulmonary embolism Continue scheduled Xopenex and Atrovent nebs, Pulmicort, taper Solu-Medrol. Switch Levaquin to by mouth. Morphine available when necessary for feeling of air hunger, , still requiring 3 L of oxygen, component of congestive heart failure, or cardiac cath in February 2014, most recent EF is 55-60%  Discussed with Dr Sharyn Lull , patient may have catheter-induced mitral regurgitation and needs to follow up with him in an outpatient setting  Doppler negative for DVT, after discussion with cardiology the patient was initiated on Lasix yesterday, had to 2250 cc out, continue diuresis  Discussed plan of care with the son by the bedside  DiscussED with cardiology about patient's recurrent episodes of chest pain may need a stress test prior to discharge  Scheduled for lexiscan  Myoview however the patient was not n.p.o. past midnight and test canceled until tomorrow   2. HTN; continue home medications  3. Fibromyalgia syndrome: Large doses of narcotics are contraindicated in fibromyalgia patients will DC Dilaudid continue patient on Norco, meloxicam, Lyrica:  4. Polysubstance abuse; resume Xanax low dose and discontinued Librium  5. Headache; Imitrex  6. Hypokalemia replete   Code Status: Full  Family Communication: Son by the bedside  Disposition Plan: Anticipate discharge tomorrow after the stress test  Consultants:  Procedures:  CXR 07/12/2013 1. Cardiomegaly without pulmonary edema.  2.  Multiple pleural calcifications, stable in appearance.  Antibiotics:  Levofloxacin day 1/7  HPI/Subjective:  Patient is a 67 year old female with history of asbestosis, asthma, hypertension, hyperlipidemia was sent from PCPs office for shortness of breath and wheezing. History was obtained from the patient who did have difficulty completing sentences due to shortness of breath and wheezing. Patient reported that she started having shortness of breath and wheezing since last night which continued to get progressively worse. She did have slight right-sided chest pain yesterday and took sublingual nitroglycerin which resolved the chest pain. She denied any fever or chills. Typically, patient uses Symbicort and rescue inhaler as needed however last night she she had used her albuterol inhaler twice.  In the ER patient received Solu-Medrol and albuterol nebulizer treatment with mild relief.    HPI/Subjective:  Overall feeling better Chest pain-free today, still has some wheezing,      Objective: Filed Vitals:   07/16/13 1846 07/16/13 1931 07/16/13 2203 07/17/13 0635  BP: 149/93  116/66 118/67  Pulse: 95  96 78  Temp:   97.9 F (36.6 C) 97.8 F (36.6 C)  TempSrc:   Oral Oral  Resp:   18 20  Height:      Weight:    74.6 kg (164 lb 7.4 oz)  SpO2: 97% 98% 97% 98%    Intake/Output Summary (Last 24 hours) at 07/17/13 0811 Last data filed at 07/17/13 8119  Gross per 24 hour  Intake    600 ml  Output   2525 ml  Net  -1925 ml    Exam:  HENT:  Head: Atraumatic.  Nose: Nose normal.  Mouth/Throat: Oropharynx is clear and moist.  Eyes: Conjunctivae are normal. Pupils  are equal, round, and reactive to light. No scleral icterus.  Neck: Neck supple. No tracheal deviation present.  Cardiovascular: Normal rate, regular rhythm, normal heart sounds and intact distal pulses.  Pulmonary/Chest: Effort normal and breath sounds normal. No respiratory distress.  Abdominal: Soft. Normal appearance  and bowel sounds are normal. She exhibits no distension. There is no tenderness.  Musculoskeletal: She exhibits no edema and no tenderness.  Neurological: She is alert. No cranial nerve deficit.    Data Reviewed: Basic Metabolic Panel:  Recent Labs Lab 07/12/13 1330 07/12/13 1752 07/13/13 0450 07/15/13 0515 07/16/13 0557  NA 142  --  142 140 139  K 3.1*  --  3.6 2.8* 3.5  CL 104  --  105 98 98  CO2 26  --  23 31 31   GLUCOSE 97  --  162* 162* 96  BUN 20  --  13 28* 31*  CREATININE 0.69 0.55 0.54 0.68 0.83  CALCIUM 9.2  --  9.2 9.2 8.8    Liver Function Tests:  Recent Labs Lab 07/12/13 1330  AST 25  ALT 23  ALKPHOS 79  BILITOT 0.1*  PROT 6.8  ALBUMIN 3.5   No results found for this basename: LIPASE, AMYLASE,  in the last 168 hours No results found for this basename: AMMONIA,  in the last 168 hours  CBC:  Recent Labs Lab 07/12/13 1330 07/12/13 1752 07/13/13 0450  WBC 10.5 12.3* 11.2*  HGB 11.2* 10.5* 10.3*  HCT 35.2* 31.8* 31.0*  MCV 87.6 86.2 87.1  PLT 178 179 178    Cardiac Enzymes:  Recent Labs Lab 07/12/13 1330 07/12/13 1752 07/12/13 2333 07/13/13 0450 07/14/13 1305  TROPONINI <0.30 <0.30 <0.30 <0.30 <0.30   BNP (last 3 results)  Recent Labs  07/12/13 1330  PROBNP 214.8*     CBG: No results found for this basename: GLUCAP,  in the last 168 hours  No results found for this or any previous visit (from the past 240 hour(s)).   Studies: Ct Angio Chest Pe W/cm &/or Wo Cm  07/13/2013   *RADIOLOGY REPORT*  Clinical Data: Acute onset of shortness of breath with elevated D- dimer.  Known history of prior asbestos exposure.  CT ANGIOGRAPHY CHEST  Technique:  Multidetector CT imaging of the chest using the standard protocol during bolus administration of intravenous contrast. Multiplanar reconstructed images including MIPs were obtained and reviewed to evaluate the vascular anatomy.  Contrast: OMNIPAQUE IOHEXOL 350 MG/ML SOLN  Comparison:  Prior CTA of the chest performed at [facility] on 03/16/2013.  Findings: The pulmonary arteries are well opacified.  There is no evidence of acute pulmonary embolism.  Lungs show increase in pulmonary vascularity since the prior study with mild ground-glass airspace disease noted in both upper lung zones.  Findings are suggestive of a component of interstitial edema/CHF.  A trace amount of pleural fluid is present on the left.  Again noted are a multitude of calcified pleural plaques bilaterally with associated areas of pleural thickening.  Findings are consistent with prior asbestos exposure.  Mildly prominent hilar and mediastinal lymph node tissue is again noted which may be reactive.  Calcified plaque is identified in the distribution of the LAD and left circumflex coronary artery.  The heart is mildly enlarged.  No pericardial fluid is seen.  IMPRESSION:  1.  No evidence of acute pulmonary embolism. 2.  Probable component of interstitial edema/CHF with associated cardiomegaly. 3.  Evidence of coronary atherosclerosis with calcified plaque present in  the distribution of the LAD and left circumflex coronary artery. 4.  Stable multitude of calcified pleural plaques bilaterally consistent with prior asbestos exposure.   Original Report Authenticated By: Irish Lack, M.D.   Dg Chest Port 1 View  07/12/2013   *RADIOLOGY REPORT*  Clinical Data: Shortness of breath.  History of asbestos damage to lungs.  History of hypertension.  PORTABLE CHEST - 1 VIEW  Comparison: 03/15/2013 chest x-ray and 03/16/2013 chest CT  Findings: Heart size is mildly enlarged.  Pleural calcifications are again noted.  No new consolidation or pleural effusion identified.  No pulmonary edema.  IMPRESSION:  1.  Cardiomegaly without pulmonary edema. 2.  Multiple pleural calcifications, stable in appearance.   Original Report Authenticated By: Norva Pavlov, M.D.    Scheduled Meds: . amitriptyline  50 mg Oral QHS  . aspirin  EC  81 mg Oral Daily  . atorvastatin  20 mg Oral q1800  . budesonide  0.25 mg Nebulization BID  . clopidogrel  75 mg Oral Q breakfast  . enoxaparin (LOVENOX) injection  40 mg Subcutaneous Q24H  . furosemide  40 mg Intravenous BID  . guaiFENesin  600 mg Oral BID  . ipratropium  0.5 mg Nebulization BID  . levalbuterol  0.63 mg Nebulization BID  . levofloxacin  750 mg Oral Daily  . meloxicam  7.5 mg Oral BID WC  . metoprolol succinate  100 mg Oral Daily  . Milnacipran  50 mg Oral BID  . pantoprazole  40 mg Oral Daily  . predniSONE  40 mg Oral Q breakfast  . pregabalin  300 mg Oral BID  . sodium chloride  3 mL Intravenous Q12H  . temazepam  15 mg Oral QHS   Continuous Infusions:   Principal Problem:   Acute hypoxic respiratory failure Active Problems:   HTN (hypertension), benign   Fibromyalgia syndrome   Polypharmacy   Asthma with acute exacerbation    Time spent: 40 minutes   [facility]  [facility] Pager [phone]. If 8PM-8AM, please contact night-coverage at [email or web address], password [facility] 07/17/2013, 8:11 AM  LOS: 5 days

## 2013-07-17 NOTE — Progress Notes (Signed)
Alert and cooperative.  Client becomes SOB with minimal exertion.

## 2013-07-17 NOTE — Progress Notes (Signed)
Subjective:  Patient presently denies any chest pain states had chest pain localized last night without associated symptoms. Anxious about her husband's health who was admitted at [facility]. Patient ate breakfast earlier this morning reschedule for stress test tomorrow  Objective:  Vital Signs in the last 24 hours: Temp:  [97.4 F (36.3 C)-97.9 F (36.6 C)] 97.8 F (36.6 C) (07/26 0635) Pulse Rate:  [78-96] 83 (07/26 0935) Resp:  [18-20] 20 (07/26 0935) BP: (116-149)/(57-93) 120/57 mmHg (07/26 0935) SpO2:  [96 %-98 %] 96 % (07/26 0840) Weight:  [74.6 kg (164 lb 7.4 oz)] 74.6 kg (164 lb 7.4 oz) (07/26 0635)  Intake/Output from previous day: 07/25 0701 - 07/26 0700 In: 820 [P.O.:820] Out: 2525 [Urine:2525] Intake/Output from this shift: Total I/O In: 220 [P.O.:220] Out: -   Physical Exam: Neck: no adenopathy, no carotid bruit, no JVD and supple, symmetrical, trachea midline Lungs: clear to auscultation bilaterally Heart: regular rate and rhythm, S1, S2 normal and Soft systolic murmur noted Abdomen: soft, non-tender; bowel sounds normal; no masses,  no organomegaly Extremities: extremities normal, atraumatic, no cyanosis or edema  Lab Results: No results found for this basename: WBC, HGB, PLT,  in the last 72 hours  Recent Labs  07/15/13 0515 07/16/13 0557  NA 140 139  K 2.8* 3.5  CL 98 98  CO2 31 31  GLUCOSE 162* 96  BUN 28* 31*  CREATININE 0.68 0.83    Recent Labs  07/14/13 1305  TROPONINI <0.30   Hepatic Function Panel No results found for this basename: PROT, ALBUMIN, AST, ALT, ALKPHOS, BILITOT, BILIDIR, IBILI,  in the last 72 hours No results found for this basename: CHOL,  in the last 72 hours No results found for this basename: PROTIME,  in the last 72 hours  Imaging: Imaging results have been reviewed  Cardiac Studies:  Assessment/Plan:  Unstable angina MI ruled out rule out progression of CAD  Moderate focal RCA stenosis  Resolving exacerbation of  bronchial asthma  Hypertension  Hypercholesteremia  Obstructive sleep apnea  History of asbestosis  Degenerative joint disease  Fibromyalgia  Anemia  Diabetes matters  Depression  History of hiatus hernia  Plan Continue present management Reschedule for stress test tomorrow  LOS: 5 days    Dorothia Passmore N 07/17/2013, 10:05 AM

## 2013-07-18 ENCOUNTER — Inpatient Hospital Stay (HOSPITAL_COMMUNITY): Payer: Medicare Other

## 2013-07-18 ENCOUNTER — Other Ambulatory Visit: Payer: Self-pay

## 2013-07-18 DIAGNOSIS — I200 Unstable angina: Secondary | ICD-10-CM | POA: Diagnosis not present

## 2013-07-18 DIAGNOSIS — I7090 Unspecified atherosclerosis: Secondary | ICD-10-CM | POA: Diagnosis not present

## 2013-07-18 DIAGNOSIS — J45901 Unspecified asthma with (acute) exacerbation: Secondary | ICD-10-CM | POA: Diagnosis not present

## 2013-07-18 DIAGNOSIS — R0602 Shortness of breath: Secondary | ICD-10-CM | POA: Diagnosis not present

## 2013-07-18 DIAGNOSIS — E119 Type 2 diabetes mellitus without complications: Secondary | ICD-10-CM | POA: Diagnosis not present

## 2013-07-18 DIAGNOSIS — R079 Chest pain, unspecified: Secondary | ICD-10-CM | POA: Diagnosis not present

## 2013-07-18 DIAGNOSIS — J9600 Acute respiratory failure, unspecified whether with hypoxia or hypercapnia: Secondary | ICD-10-CM | POA: Diagnosis not present

## 2013-07-18 DIAGNOSIS — E7800 Pure hypercholesterolemia, unspecified: Secondary | ICD-10-CM | POA: Diagnosis not present

## 2013-07-18 DIAGNOSIS — I10 Essential (primary) hypertension: Secondary | ICD-10-CM | POA: Diagnosis not present

## 2013-07-18 LAB — BASIC METABOLIC PANEL
BUN: 47 mg/dL — ABNORMAL HIGH (ref 6–23)
Chloride: 94 mEq/L — ABNORMAL LOW (ref 96–112)
GFR calc Af Amer: 49 mL/min — ABNORMAL LOW (ref >90)
Potassium: 3 mEq/L — ABNORMAL LOW (ref 3.5–5.1)

## 2013-07-18 MED ORDER — SODIUM CHLORIDE 0.9 % IV BOLUS (SEPSIS)
1000.0000 mL | Freq: Once | INTRAVENOUS | Status: AC
  Administered 2013-07-18: 1000 mL via INTRAVENOUS

## 2013-07-18 MED ORDER — TECHNETIUM TC 99M SESTAMIBI GENERIC - CARDIOLITE
30.0000 | Freq: Once | INTRAVENOUS | Status: AC | PRN
  Administered 2013-07-18: 30 via INTRAVENOUS

## 2013-07-18 MED ORDER — METOPROLOL SUCCINATE ER 50 MG PO TB24
50.0000 mg | ORAL_TABLET | Freq: Every day | ORAL | Status: DC
Start: 2013-07-18 — End: 2013-07-19
  Filled 2013-07-18 (×2): qty 1

## 2013-07-18 MED ORDER — TECHNETIUM TC 99M SESTAMIBI GENERIC - CARDIOLITE
10.0000 | Freq: Once | INTRAVENOUS | Status: AC | PRN
  Administered 2013-07-18: 10 via INTRAVENOUS

## 2013-07-18 MED ORDER — REGADENOSON 0.4 MG/5ML IV SOLN
INTRAVENOUS | Status: AC
  Administered 2013-07-18: 0.4 mg
  Filled 2013-07-18: qty 5

## 2013-07-18 MED ORDER — POTASSIUM CHLORIDE CRYS ER 20 MEQ PO TBCR
40.0000 meq | EXTENDED_RELEASE_TABLET | Freq: Three times a day (TID) | ORAL | Status: AC
  Administered 2013-07-18 (×3): 40 meq via ORAL
  Filled 2013-07-18 (×3): qty 2

## 2013-07-18 MED ORDER — SODIUM CHLORIDE 0.9 % IV SOLN
Freq: Once | INTRAVENOUS | Status: AC
  Administered 2013-07-18: 09:00:00 via INTRAVENOUS

## 2013-07-18 MED ORDER — REGADENOSON 0.4 MG/5ML IV SOLN
0.4000 mg | Freq: Once | INTRAVENOUS | Status: DC
  Filled 2013-07-18: qty 5

## 2013-07-18 NOTE — Progress Notes (Signed)
Report received from patient's stress test that BP was running low during procedure. Patient arrived to the unit. No complaints and no signs or symptoms of distress. Patient's BP is currently 102/58 and HR is 83. Pt is due for scheduled metoprolol. MD made aware that patient had to receive 2 blouses during procedure and that the BP was running low as well. New orders given to hold metoprolol. Will continue to monitor patient for further changes in condition.

## 2013-07-18 NOTE — Progress Notes (Signed)
MD made aware of Potassium level being 3.0. Patient has no complaints and has no signs or symptoms of distress or discomfort. Will continue to monitor patient for further changes in condition.

## 2013-07-18 NOTE — Progress Notes (Signed)
Called into room by patient with complaints of throbbing mid chest pain, non radiating 7/10. BP 105/90, SL NTG X 1 given.  5 minutes later, patients BP dropped to 60/40 & 70/30's. Placed patient into trendelenburg position. Patient also began to have complaints of the pain radiating to the right side of her chest and jaw.  Patient's BP rose to 127/66 while in trendelenburg. Lenny Pastel paged and 2mg  Morphine given.  Dr Sharyn Lull paged, received orders to give PRN Vicodin and Xanax and have troponin drawn.  All orders carried out, patient still having complaints of chest pain 5/10 and head/jaw pain 8/10. Lenny Pastel aware. Will continue to monitor. Blood pressure 114/63, pulse 86, temperature 97.8 F (36.6 C), temperature source Oral, resp. rate 20, height 5\' 4"  (1.626 m), weight 74.6 kg (164 lb 7.4 oz), SpO2 100.00%. Troy Sine

## 2013-07-18 NOTE — Progress Notes (Signed)
BP 88 58 HR 86 Not dizzy, has 3/10 chest pressure. Patient states trying not to be SOB. Dr Sharyn Lull aware of BP. See orders. 250cc NS bolus given between 0900 and 0915. BP repeat 92/52. HR 83.On 4L [place] and SPo2 100%.

## 2013-07-18 NOTE — Progress Notes (Addendum)
[facility] PROGRESS NOTE  Linda [name] GNF:[id number] DOB: [birth year] DOA: 07/12/2013 PCP: Jeoffrey Massed, MD  Assessment/Plan: Principal Problem:   Acute hypoxic respiratory failure Active Problems:   HTN (hypertension), benign   Fibromyalgia syndrome   Polypharmacy   Asthma with acute exacerbation    Assessment/Plan:  1. Acute hypoxic respiratory failure; patient SpO2= 94% on nasal cannula of 3 L O2 troponin x3 negative BNP= to 14.8, d-dimer= 1.28 , CT scan negative for pulmonary embolism Continue scheduled Xopenex and Atrovent nebs, Pulmicort, taper Solu-Medrol. Switch Levaquin to by mouth. Morphine available when necessary for feeling of air hunger, , still requiring 3 L of oxygen, component of congestive heart failure, or cardiac cath in February 2014, most recent EF is 55-60%  Doppler negative for DVT, after discussion with cardiology the patient was initiated on Lasix  , had to 2250 cc out,BP now soft therefore  dc lasix zaroxolyn today and monitor  Discussed plan of care with the son by the bedside  DiscussED with cardiology about patient's recurrent episodes of chest pain needs a stress test prior to discharge  Rescheduled  for lexiscan Myoview for today  2. HTN; continue home medications, discontinue diuretics because of low blood pressure, decrease metoprolol to 50 mg a day  3. Fibromyalgia syndrome: Large doses of narcotics are contraindicated in fibromyalgia patients will DC Dilaudid continue patient on Norco, meloxicam, Lyrica:  4. Polysubstance abuse; resume Xanax low dose and discontinued Librium  5. Headache; Imitrex  6. Hypokalemia replete    Code Status: Full  Family Communication: Son by the bedside  Disposition Plan: Would like to monitor for blood pressure and renal function today  Consultants:  Procedures:  CXR 07/12/2013 1. Cardiomegaly without pulmonary edema.  2. Multiple pleural calcifications, stable in appearance.  Antibiotics:   Levofloxacin day 1/7  HPI/Subjective:  Patient is a 67 year old female with history of asbestosis, asthma, hypertension, hyperlipidemia was sent from PCPs office for shortness of breath and wheezing. History was obtained from the patient who did have difficulty completing sentences due to shortness of breath and wheezing. Patient reported that she started having shortness of breath and wheezing since last night which continued to get progressively worse. She did have slight right-sided chest pain yesterday and took sublingual nitroglycerin which resolved the chest pain. She denied any fever or chills. Typically, patient uses Symbicort and rescue inhaler as needed however last night she she had used her albuterol inhaler twice.  In the ER patient received Solu-Medrol and albuterol nebulizer treatment with mild relief.    HPI/Subjective:  Had another episode of chest pain last night, Dr. blood pressure often feeling nitroglycerin I/o  was not charted      Objective: Filed Vitals:   07/18/13 0411 07/18/13 0649 07/18/13 0758 07/18/13 0802  BP: 114/63 104/60    Pulse:  80    Temp:  97.7 F (36.5 C)    TempSrc:  Oral    Resp:  18    Height:      Weight:  74.118 kg (163 lb 6.4 oz)    SpO2: 100% 98% 98% 96%    Intake/Output Summary (Last 24 hours) at 07/18/13 0806 Last data filed at 07/18/13 0505  Gross per 24 hour  Intake    800 ml  Output   2000 ml  Net  -1200 ml    Exam:  HENT:  Head: Atraumatic.  Nose: Nose normal.  Mouth/Throat: Oropharynx is clear and moist.  Eyes: Conjunctivae are normal. Pupils are equal,  round, and reactive to light. No scleral icterus.  Neck: Neck supple. No tracheal deviation present.  Cardiovascular: Normal rate, regular rhythm, normal heart sounds and intact distal pulses.  Pulmonary/Chest: Effort normal and breath sounds normal. No respiratory distress.  Abdominal: Soft. Normal appearance and bowel sounds are normal. She exhibits no distension.  There is no tenderness.  Musculoskeletal: She exhibits no edema and no tenderness.  Neurological: She is alert. No cranial nerve deficit.    Data Reviewed: Basic Metabolic Panel:  Recent Labs Lab 07/12/13 1330 07/12/13 1752 07/13/13 0450 07/15/13 0515 07/16/13 0557 07/18/13 0515  NA 142  --  142 140 139 139  K 3.1*  --  3.6 2.8* 3.5 3.0*  CL 104  --  105 98 98 94*  CO2 26  --  23 31 31  32  GLUCOSE 97  --  162* 162* 96 109*  BUN 20  --  13 28* 31* 47*  CREATININE 0.69 0.55 0.54 0.68 0.83 1.29*  CALCIUM 9.2  --  9.2 9.2 8.8 10.0    Liver Function Tests:  Recent Labs Lab 07/12/13 1330  AST 25  ALT 23  ALKPHOS 79  BILITOT 0.1*  PROT 6.8  ALBUMIN 3.5   No results found for this basename: LIPASE, AMYLASE,  in the last 168 hours No results found for this basename: AMMONIA,  in the last 168 hours  CBC:  Recent Labs Lab 07/12/13 1330 07/12/13 1752 07/13/13 0450  WBC 10.5 12.3* 11.2*  HGB 11.2* 10.5* 10.3*  HCT 35.2* 31.8* 31.0*  MCV 87.6 86.2 87.1  PLT 178 179 178    Cardiac Enzymes:  Recent Labs Lab 07/12/13 1330 07/12/13 1752 07/12/13 2333 07/13/13 0450 07/14/13 1305  TROPONINI <0.30 <0.30 <0.30 <0.30 <0.30   BNP (last 3 results)  Recent Labs  07/12/13 1330  PROBNP 214.8*     CBG: No results found for this basename: GLUCAP,  in the last 168 hours  No results found for this or any previous visit (from the past 240 hour(s)).   Studies: Ct Angio Chest Pe W/cm &/or Wo Cm  07/13/2013   *RADIOLOGY REPORT*  Clinical Data: Acute onset of shortness of breath with elevated D- dimer.  Known history of prior asbestos exposure.  CT ANGIOGRAPHY CHEST  Technique:  Multidetector CT imaging of the chest using the standard protocol during bolus administration of intravenous contrast. Multiplanar reconstructed images including MIPs were obtained and reviewed to evaluate the vascular anatomy.  Contrast: OMNIPAQUE IOHEXOL 350 MG/ML SOLN  Comparison: Prior  CTA of the chest performed at [facility] on 03/16/2013.  Findings: The pulmonary arteries are well opacified.  There is no evidence of acute pulmonary embolism.  Lungs show increase in pulmonary vascularity since the prior study with mild ground-glass airspace disease noted in both upper lung zones.  Findings are suggestive of a component of interstitial edema/CHF.  A trace amount of pleural fluid is present on the left.  Again noted are a multitude of calcified pleural plaques bilaterally with associated areas of pleural thickening.  Findings are consistent with prior asbestos exposure.  Mildly prominent hilar and mediastinal lymph node tissue is again noted which may be reactive.  Calcified plaque is identified in the distribution of the LAD and left circumflex coronary artery.  The heart is mildly enlarged.  No pericardial fluid is seen.  IMPRESSION:  1.  No evidence of acute pulmonary embolism. 2.  Probable component of interstitial edema/CHF with associated cardiomegaly. 3.  Evidence  of coronary atherosclerosis with calcified plaque present in the distribution of the LAD and left circumflex coronary artery. 4.  Stable multitude of calcified pleural plaques bilaterally consistent with prior asbestos exposure.   Original Report Authenticated By: Irish Lack, M.D.   Dg Chest Port 1 View  07/12/2013   *RADIOLOGY REPORT*  Clinical Data: Shortness of breath.  History of asbestos damage to lungs.  History of hypertension.  PORTABLE CHEST - 1 VIEW  Comparison: 03/15/2013 chest x-ray and 03/16/2013 chest CT  Findings: Heart size is mildly enlarged.  Pleural calcifications are again noted.  No new consolidation or pleural effusion identified.  No pulmonary edema.  IMPRESSION:  1.  Cardiomegaly without pulmonary edema. 2.  Multiple pleural calcifications, stable in appearance.   Original Report Authenticated By: Norva Pavlov, M.D.    Scheduled Meds: . amitriptyline  50 mg Oral QHS  . aspirin EC  81  mg Oral Daily  . atorvastatin  20 mg Oral q1800  . budesonide  0.25 mg Nebulization BID  . clopidogrel  75 mg Oral Q breakfast  . enoxaparin (LOVENOX) injection  40 mg Subcutaneous Q24H  . guaiFENesin  600 mg Oral BID  . ipratropium  0.5 mg Nebulization BID  . levalbuterol  0.63 mg Nebulization BID  . levofloxacin  750 mg Oral Daily  . meloxicam  7.5 mg Oral BID WC  . metolazone  2.5 mg Oral Daily  . metoprolol succinate  100 mg Oral Daily  . Milnacipran  50 mg Oral BID  . pantoprazole  40 mg Oral Daily  . potassium chloride  40 mEq Oral TID  . predniSONE  40 mg Oral Q breakfast  . pregabalin  300 mg Oral BID  . sodium chloride  3 mL Intravenous Q12H  . temazepam  15 mg Oral QHS   Continuous Infusions:   Principal Problem:   Acute hypoxic respiratory failure Active Problems:   HTN (hypertension), benign   Fibromyalgia syndrome   Polypharmacy   Asthma with acute exacerbation    Time spent: 40 minutes   [facility]  [facility] Pager [phone]. If 8PM-8AM, please contact night-coverage at [email or web address], password [facility] 07/18/2013, 8:06 AM  LOS: 6 days

## 2013-07-18 NOTE — Progress Notes (Signed)
Subjective:   patient complain of chest pain last night received sublingual nitroglycerin and morphine with drop in blood pressure requiring fluid bolus. EKG showed no new acute ischemic changes scheduled for a stress test today  Objective:  Vital Signs in the last 24 hours: Temp:  [97.6 F (36.4 C)-97.9 F (36.6 C)] 97.7 F (36.5 C) (07/27 0649) Pulse Rate:  [80-96] 83 (07/27 0915) Resp:  [16-20] 16 (07/27 0915) BP: (69-127)/(39-90) 95/52 mmHg (07/27 0915) SpO2:  [96 %-100 %] 100 % (07/27 0855) Weight:  [74.118 kg (163 lb 6.4 oz)] 74.118 kg (163 lb 6.4 oz) (07/27 0649)  Intake/Output from previous day: 07/26 0701 - 07/27 0700 In: 800 [P.O.:800] Out: 2000 [Urine:2000] Intake/Output from this shift:    Physical Exam: Exam essentially unchanged  Lab Results: No results found for this basename: WBC, HGB, PLT,  in the last 72 hours  Recent Labs  07/16/13 0557 07/18/13 0515  NA 139 139  K 3.5 3.0*  CL 98 94*  CO2 31 32  GLUCOSE 96 109*  BUN 31* 47*  CREATININE 0.83 1.29*    Recent Labs  07/18/13 0800  TROPONINI <0.30   Hepatic Function Panel No results found for this basename: PROT, ALBUMIN, AST, ALT, ALKPHOS, BILITOT, BILIDIR, IBILI,  in the last 72 hours No results found for this basename: CHOL,  in the last 72 hours No results found for this basename: PROTIME,  in the last 72 hours  Imaging: Imaging results have been reviewed and No results found.  Cardiac Studies:  Assessment/Plan:  Unstable angina MI ruled out rule out progression of CAD  Moderate focal RCA stenosis  Resolving exacerbation of bronchial asthma  Hypertension  Hypercholesteremia  Obstructive sleep apnea  History of asbestosis  Degenerative joint disease  Fibromyalgia  Anemia  Diabetes matters  Depression  History of hiatus hernia  Plan Schedule for nuclear stress test today  LOS: 6 days    Linda [name] N 07/18/2013, 9:23 AM

## 2013-07-18 NOTE — Progress Notes (Signed)
BP running systolic 80's to 10'R pre test. Dr Sharyn Lull arrived and NS bolus pre test. NS running wide open during test which was started at 0949 and completed at 1006. Had a total of 1250cc NS fluid bolus pre and during myoview stress test. SBP was mainly in 80's and HR 80's. Tolerated myoview well, blood pressure 93 53. Feels OK. Heart rate 80's. Report given to [facility] re BP and fluids.

## 2013-07-19 DIAGNOSIS — I200 Unstable angina: Secondary | ICD-10-CM | POA: Diagnosis not present

## 2013-07-19 DIAGNOSIS — I7090 Unspecified atherosclerosis: Secondary | ICD-10-CM | POA: Diagnosis not present

## 2013-07-19 DIAGNOSIS — E7800 Pure hypercholesterolemia, unspecified: Secondary | ICD-10-CM | POA: Diagnosis not present

## 2013-07-19 DIAGNOSIS — R0602 Shortness of breath: Secondary | ICD-10-CM | POA: Diagnosis not present

## 2013-07-19 DIAGNOSIS — I10 Essential (primary) hypertension: Secondary | ICD-10-CM | POA: Diagnosis not present

## 2013-07-19 LAB — COMPREHENSIVE METABOLIC PANEL
AST: 15 U/L (ref 0–37)
Albumin: 3.3 g/dL — ABNORMAL LOW (ref 3.5–5.2)
Alkaline Phosphatase: 65 U/L (ref 39–117)
BUN: 42 mg/dL — ABNORMAL HIGH (ref 6–23)
Chloride: 99 mEq/L (ref 96–112)
Potassium: 4.2 mEq/L (ref 3.5–5.1)
Total Bilirubin: 0.3 mg/dL (ref 0.3–1.2)

## 2013-07-19 MED ORDER — GUAIFENESIN ER 600 MG PO TB12: 600.0000 mg | ORAL_TABLET | Freq: Two times a day (BID) | ORAL | Status: DC

## 2013-07-19 MED ORDER — PREDNISONE 20 MG PO TABS: 40.0000 mg | ORAL_TABLET | Freq: Every day | ORAL | Status: AC

## 2013-07-19 MED ORDER — ALBUTEROL SULFATE HFA 108 (90 BASE) MCG/ACT IN AERS: 2.0000 | INHALATION_SPRAY | Freq: Four times a day (QID) | RESPIRATORY_TRACT | Status: DC | PRN

## 2013-07-19 MED ORDER — METOPROLOL SUCCINATE ER 25 MG PO TB24: 25.0000 mg | ORAL_TABLET | Freq: Every day | ORAL | Status: DC

## 2013-07-19 MED ORDER — NITROGLYCERIN 0.4 MG SL SUBL: 0.4000 mg | SUBLINGUAL_TABLET | SUBLINGUAL | Status: DC | PRN

## 2013-07-19 MED ORDER — METOPROLOL SUCCINATE ER 50 MG PO TB24: 50.0000 mg | ORAL_TABLET | Freq: Every day | ORAL | Status: DC

## 2013-07-19 MED ORDER — BUDESONIDE-FORMOTEROL FUMARATE 160-4.5 MCG/ACT IN AERO: 2.0000 | INHALATION_SPRAY | Freq: Two times a day (BID) | RESPIRATORY_TRACT | Status: DC

## 2013-07-19 NOTE — Progress Notes (Signed)
Called to patient's room with complaints of back/leg pain.  PRN Vicodin given as ordered. Patient currently resting comfortably, pain free. Will continue to monitor. Linda [name]

## 2013-07-19 NOTE — Progress Notes (Signed)
Subjective:  Patient denies any chest pain or shortness of breath. Nuclear stress test negative for ischemia  Objective:  Vital Signs in the last 24 hours: Temp:  [97.1 F (36.2 C)-98 F (36.7 C)] 97.1 F (36.2 C) (07/28 0627) Pulse Rate:  [77-104] 91 (07/28 1000) Resp:  [18-19] 19 (07/28 0627) BP: (101-118)/(56-64) 107/56 mmHg (07/28 1000) SpO2:  [97 %-100 %] 100 % (07/28 0627) Weight:  [75.932 kg (167 lb 6.4 oz)] 75.932 kg (167 lb 6.4 oz) (07/28 0627)  Intake/Output from previous day: 07/27 0701 - 07/28 0700 In: 823 [P.O.:820; I.V.:3] Out: 700 [Urine:700] Intake/Output from this shift: Total I/O In: 223 [P.O.:220; I.V.:3] Out: -   Physical Exam: Neck: no adenopathy, no carotid bruit, no JVD and supple, symmetrical, trachea midline Lungs: clear to auscultation bilaterally Heart: regular rate and rhythm, S1, S2 normal and Soft systolic murmur noted no S3 gallop Abdomen: soft, non-tender; bowel sounds normal; no masses,  no organomegaly Extremities: extremities normal, atraumatic, no cyanosis or edema  Lab Results: No results found for this basename: WBC, HGB, PLT,  in the last 72 hours  Recent Labs  07/18/13 0515 07/19/13 0425  NA 139 138  K 3.0* 4.2  CL 94* 99  CO2 32 29  GLUCOSE 109* 144*  BUN 47* 42*  CREATININE 1.29* 1.14*    Recent Labs  07/18/13 0800  TROPONINI <0.30   Hepatic Function Panel  Recent Labs  07/19/13 0425  PROT 6.6  ALBUMIN 3.3*  AST 15  ALT 18  ALKPHOS 65  BILITOT 0.3   No results found for this basename: CHOL,  in the last 72 hours No results found for this basename: PROTIME,  in the last 72 hours  Imaging: Imaging results have been reviewed and Nm Myocar Multi W/spect W/wall Motion / Ef  07/18/2013   *RADIOLOGY REPORT*  Clinical Data:  Chest pain  MYOCARDIAL IMAGING WITH SPECT (REST AND PHARMACOLOGIC-STRESS) GATED LEFT VENTRICULAR WALL MOTION STUDY LEFT VENTRICULAR EJECTION FRACTION  Technique:  Standard myocardial SPECT  imaging was performed after resting intravenous injection of 10 mCi Tc-6m tetrofosmin. Subsequently, intravenous infusion of Lexiscan was performed under the supervision of the Cardiology staff.  At peak effect of the drug, 30 mCi Tc-63m tetrofosmin was injected intravenously and standard myocardial SPECT  imaging was performed.  Quantitative gated imaging was also performed to evaluate left ventricular wall motion, and estimate left ventricular ejection fraction.  Comparison:  None.  Findings:  Spect:  Allowing for breast attenuation artifact, there are no perfusion defects.  Wall motion:  Normal.  Ejection fraction:  92%.  End diastolic volume 35 ml.  End-systolic volume 3 ml.  IMPRESSION: Allowing for breast attenuation artifact, there are no perfusion defects.   Original Report Authenticated By: Jolaine Click, M.D.    Cardiac Studies:  Assessment/Plan:  Status post recurrent chest pain negative nuclear stress test Moderate focal RCA stenosis  Resolving exacerbation of bronchial asthma  Hypertension  Hypercholesteremia  Obstructive sleep apnea  History of asbestosis  Degenerative joint disease  Fibromyalgia  Anemia  Diabetes matters  Depression  History of hiatus hernia  Plan Okay to discharge from cardial point of view   LOS: 7 days    Kemani Heidel N 07/19/2013, 12:12 PM

## 2013-07-19 NOTE — Progress Notes (Signed)
Physical Therapy Treatment Patient Details Name: Linda [name] MRN: [id number] DOB: [birth year] Today's Date: 07/19/2013 Time: 1478-2956 PT Time Calculation (min): 18 min  PT Assessment / Plan / Recommendation  History of Present Illness pt adm with SOB, wheezing and poor saturations.  SOB has been assoc with CP relived by nitro.      PT Comments   Pt agreeable to participate in PT session.  Increased ambulation distance.  She only c/o dizziness & "seeing black dots" when asked.  BP 102/61 & 02 sats >97% RA.     Follow Up Recommendations  Home health PT     Does the patient have the potential to tolerate intense rehabilitation     Barriers to Discharge        Equipment Recommendations  Rolling walker with 5" wheels    Recommendations for Other Services    Frequency Min 3X/week   Progress towards PT Goals Progress towards PT goals: Progressing toward goals  Plan Current plan remains appropriate    Precautions / Restrictions Precautions Precautions: Fall Precaution Comments: check O2 Sats Restrictions Weight Bearing Restrictions: No   Pertinent Vitals/Pain BP 102/61 Standing in hallway after pt states she's slightly dizzy.  02 sats >97% RA entire session    Mobility  Bed Mobility Bed Mobility: Supine to Sit;Sitting - Scoot to Edge of Bed;Sit to Supine Supine to Sit: 6: Modified independent (Device/Increase time);HOB flat Sitting - Scoot to Edge of Bed: 6: Modified independent (Device/Increase time) Sit to Supine: 6: Modified independent (Device/Increase time);HOB flat Transfers Transfers: Sit to Stand;Stand to Sit Sit to Stand: 5: Supervision;With upper extremity assist;From bed Stand to Sit: 5: Supervision;With upper extremity assist;To bed Ambulation/Gait Ambulation/Gait Assistance: 4: Min guard Ambulation Distance (Feet): 150 Feet Assistive device: None Ambulation/Gait Assistance Details: guarding for safety.  Pt mildly unsteady but no overt LOB.  She would  ocassionally reach for rail in hall or sink countertop in her room for stability.  Pt only reports "seeing black dots" when asked.  BP 102/61 & 02 sats >97% RA.   Gait Pattern: Step-through pattern;Decreased stride length;Shuffle Gait velocity: decreased General Gait Details: Pt mildly unsteady.  Very talkative & constantly looking into other pt's rooms Stairs: No Wheelchair Mobility Wheelchair Mobility: No       PT Goals (current goals can now be found in the care plan section) Acute Rehab PT Goals PT Goal Formulation: With patient Time For Goal Achievement: 07/29/13 Potential to Achieve Goals: Good  Visit Information  Last PT Received On: 07/19/13 Assistance Needed: +1 History of Present Illness: pt adm with SOB, wheezing and poor saturations.  SOB has been assoc with CP relived by nitro.    Subjective Data      Cognition  Cognition Arousal/Alertness: Awake/alert Behavior During Therapy: WFL for tasks assessed/performed Overall Cognitive Status: Within Functional Limits for tasks assessed    Balance  Balance Balance Assessed: Yes Static Standing Balance Static Standing - Balance Support: No upper extremity supported Static Standing - Level of Assistance: 4: Min assist Static Standing - Comment/# of Minutes: (A) to maintain balance while standing with eyes closed-- pt with ant-post sway.    End of Session PT - End of Session Equipment Utilized During Treatment: Gait belt Activity Tolerance: Patient tolerated treatment well Patient left: in bed;with call bell/phone within reach Nurse Communication: Mobility status   GP     Lara Mulch 07/19/2013, 9:29 AM   Verdell Face, PTA [phone] 07/19/2013

## 2013-07-19 NOTE — Care Management Note (Addendum)
  Page 2 of 2   07/19/2013     1:58:07 PM   CARE MANAGEMENT NOTE 07/19/2013  Patient:  [facility]   Account Number:  [id number]  Date Initiated:  07/19/2013  Documentation initiated by:  [facility]  Subjective/Objective Assessment:   67 year old female with history of asbestosis, asthma, hypertension, hyperlipidemia was sent from PCPs office for shortness of breath and wheezing.//home with spouse     Action/Plan:   diurese//Home with Home Health   Anticipated DC Date:  07/19/2013   Anticipated DC Plan:  HOME W HOME HEALTH SERVICES      DC Planning Services  CM consult      [facility] Choice  HOME HEALTH   Choice offered to / List presented to:  C-1 Patient   DME arranged  3-N-1  Levan Hurst      DME agency  [facility]     [facility] arranged  HH-1 RN  HH-10 DISEASE MANAGEMENT  HH-2 PT  HH-3 OT  HH-4 NURSE'S AIDE      HH agency  [facility]   Status of service:   Medicare Important Message given?   (If response is "NO", the following Medicare IM given date fields will be blank) Date Medicare IM given:   Date Additional Medicare IM given:    Discharge Disposition:    Per UR Regulation:    If discussed at Long Length of Stay Meetings, dates discussed:    Comments:  07/19/13 @1100  Oletta Cohn, RN, BSN, NCM [phone] Spoke with pt and son at bedside regarding discharge planning.  Offered pt list of Home Health agencies in [place].  Pt chose [facility] to render services.  Hilda Lias of [facility] notified.  DME needs identified are: 3-n1 and rolling walker.

## 2013-07-19 NOTE — Discharge Summary (Signed)
Physician Discharge Summary  Linda [name] MRN: [id number] DOB/AGE: [birth year] 67 y.o.  PCP: Jeoffrey Massed, MD   Admit date: 07/12/2013 Discharge date: 07/19/2013  Discharge Diagnoses:      Acute hypoxic respiratory failure Active Problems:   HTN (hypertension), benign   Fibromyalgia syndrome   Polypharmacy   Asthma with acute exacerbation  Followup #1 need BMP in one week #2 May need continuous readjustment of her antihypertensive regimen as her HCTZ and olmesartan were discontinued because of low blood pressure the hospital     Medication List    STOP taking these medications       hydrochlorothiazide 25 MG tablet  Commonly known as:  HYDRODIURIL     olmesartan 40 MG tablet  Commonly known as:  BENICAR     zolpidem 5 MG tablet  Commonly known as:  AMBIEN      TAKE these medications       albuterol 108 (90 BASE) MCG/ACT inhaler  Commonly known as:  PROVENTIL HFA;VENTOLIN HFA  Inhale 2 puffs into the lungs every 6 (six) hours as needed for wheezing. For shortness of breath     ALPRAZolam 1 MG tablet  Commonly known as:  XANAX  1 mg. 1 tab in AM and Noon and 1/2 tab in PM and Bedtime.     amitriptyline 100 MG tablet  Commonly known as:  ELAVIL  Take 50 mg by mouth at bedtime.     aspirin 81 MG EC tablet  Take 1 tablet (81 mg total) by mouth daily.     atorvastatin 20 MG tablet  Commonly known as:  LIPITOR  Take 1 tablet (20 mg total) by mouth daily at 6 PM.     budesonide-formoterol 160-4.5 MCG/ACT inhaler  Commonly known as:  SYMBICORT  Inhale 2 puffs into the lungs 2 (two) times daily.     CENTRUM SILVER ADULT 50+ PO  Take 1 tablet by mouth daily.     clopidogrel 75 MG tablet  Commonly known as:  PLAVIX  Take 1 tablet (75 mg total) by mouth daily with breakfast.     guaiFENesin 600 MG 12 hr tablet  Commonly known as:  MUCINEX  Take 1 tablet (600 mg total) by mouth 2 (two) times daily.     meloxicam 7.5 MG tablet  Commonly known  as:  MOBIC  Take 7.5 mg by mouth 2 (two) times daily.     metoprolol succinate 25 MG 24 hr tablet  Commonly known as:  TOPROL-XL  Take 1 tablet (25 mg total) by mouth daily. Take with or immediately following a meal.     Milnacipran 50 MG Tabs  Commonly known as:  SAVELLA  Take 1 tablet (50 mg total) by mouth 2 (two) times daily.     nitroGLYCERIN 0.4 MG SL tablet  Commonly known as:  NITROSTAT  Place 1 tablet (0.4 mg total) under the tongue every 5 (five) minutes x 3 doses as needed for chest pain.     predniSONE 20 MG tablet  Commonly known as:  DELTASONE  Take 2 tablets (40 mg total) by mouth daily with breakfast.     pregabalin 300 MG capsule  Commonly known as:  LYRICA  Take 1 capsule (300 mg total) by mouth 2 (two) times daily.     traZODone 50 MG tablet  Commonly known as:  DESYREL  1-2 tabs po qhs for insomnia        Discharge Condition:  Disposition: 01-Home or Self  Care   Consults: Cardiology  Significant Diagnostic Studies: Ct Angio Chest Pe W/cm &/or Wo Cm  07/13/2013   *RADIOLOGY REPORT*  Clinical Data: Acute onset of shortness of breath with elevated D- dimer.  Known history of prior asbestos exposure.  CT ANGIOGRAPHY CHEST  Technique:  Multidetector CT imaging of the chest using the standard protocol during bolus administration of intravenous contrast. Multiplanar reconstructed images including MIPs were obtained and reviewed to evaluate the vascular anatomy.  Contrast: OMNIPAQUE IOHEXOL 350 MG/ML SOLN  Comparison: Prior CTA of the chest performed at [facility] on 03/16/2013.  Findings: The pulmonary arteries are well opacified.  There is no evidence of acute pulmonary embolism.  Lungs show increase in pulmonary vascularity since the prior study with mild ground-glass airspace disease noted in both upper lung zones.  Findings are suggestive of a component of interstitial edema/CHF.  A trace amount of pleural fluid is present on the left.  Again  noted are a multitude of calcified pleural plaques bilaterally with associated areas of pleural thickening.  Findings are consistent with prior asbestos exposure.  Mildly prominent hilar and mediastinal lymph node tissue is again noted which may be reactive.  Calcified plaque is identified in the distribution of the LAD and left circumflex coronary artery.  The heart is mildly enlarged.  No pericardial fluid is seen.  IMPRESSION:  1.  No evidence of acute pulmonary embolism. 2.  Probable component of interstitial edema/CHF with associated cardiomegaly. 3.  Evidence of coronary atherosclerosis with calcified plaque present in the distribution of the LAD and left circumflex coronary artery. 4.  Stable multitude of calcified pleural plaques bilaterally consistent with prior asbestos exposure.   Original Report Authenticated By: Irish Lack, M.D.   Nm Myocar Multi W/spect W/wall Motion / Ef  07/18/2013   *RADIOLOGY REPORT*  Clinical Data:  Chest pain  MYOCARDIAL IMAGING WITH SPECT (REST AND PHARMACOLOGIC-STRESS) GATED LEFT VENTRICULAR WALL MOTION STUDY LEFT VENTRICULAR EJECTION FRACTION  Technique:  Standard myocardial SPECT imaging was performed after resting intravenous injection of 10 mCi Tc-11m tetrofosmin. Subsequently, intravenous infusion of Lexiscan was performed under the supervision of the Cardiology staff.  At peak effect of the drug, 30 mCi Tc-10m tetrofosmin was injected intravenously and standard myocardial SPECT  imaging was performed.  Quantitative gated imaging was also performed to evaluate left ventricular wall motion, and estimate left ventricular ejection fraction.  Comparison:  None.  Findings:  Spect:  Allowing for breast attenuation artifact, there are no perfusion defects.  Wall motion:  Normal.  Ejection fraction:  92%.  End diastolic volume 35 ml.  End-systolic volume 3 ml.  IMPRESSION: Allowing for breast attenuation artifact, there are no perfusion defects.   Original Report  Authenticated By: Jolaine Click, M.D.   Dg Chest Port 1 View  07/12/2013   *RADIOLOGY REPORT*  Clinical Data: Shortness of breath.  History of asbestos damage to lungs.  History of hypertension.  PORTABLE CHEST - 1 VIEW  Comparison: 03/15/2013 chest x-ray and 03/16/2013 chest CT  Findings: Heart size is mildly enlarged.  Pleural calcifications are again noted.  No new consolidation or pleural effusion identified.  No pulmonary edema.  IMPRESSION:  1.  Cardiomegaly without pulmonary edema. 2.  Multiple pleural calcifications, stable in appearance.   Original Report Authenticated By: Norva Pavlov, M.D.       Microbiology: No results found for this or any previous visit (from the past 240 hour(s)).   Labs: Results for orders placed during the hospital  encounter of 07/12/13 (from the past 48 hour(s))  BASIC METABOLIC PANEL     Status: Abnormal   Collection Time    07/18/13  5:15 AM      Result Value Range   Sodium 139  135 - 145 mEq/L   Potassium 3.0 (*) 3.5 - 5.1 mEq/L   Chloride 94 (*) 96 - 112 mEq/L   CO2 32  19 - 32 mEq/L   Glucose, Bld 109 (*) 70 - 99 mg/dL   BUN 47 (*) 6 - 23 mg/dL   Creatinine, Ser 1.61 (*) 0.50 - 1.10 mg/dL   Calcium 09.6  8.4 - 04.5 mg/dL   GFR calc non Af Amer 42 (*) >90 mL/min   GFR calc Af Amer 49 (*) >90 mL/min   Comment:            The eGFR has been calculated     using the CKD EPI equation.     This calculation has not been     validated in all clinical     situations.     eGFR's persistently     <90 mL/min signify     possible Chronic Kidney Disease.  TROPONIN I     Status: None   Collection Time    07/18/13  8:00 AM      Result Value Range   Troponin I <0.30  <0.30 ng/mL   Comment:            Due to the release kinetics of cTnI,     a negative result within the first hours     of the onset of symptoms does not rule out     myocardial infarction with certainty.     If myocardial infarction is still suspected,     repeat the test at  appropriate intervals.  COMPREHENSIVE METABOLIC PANEL     Status: Abnormal   Collection Time    07/19/13  4:25 AM      Result Value Range   Sodium 138  135 - 145 mEq/L   Potassium 4.2  3.5 - 5.1 mEq/L   Comment: DELTA CHECK NOTED   Chloride 99  96 - 112 mEq/L   CO2 29  19 - 32 mEq/L   Glucose, Bld 144 (*) 70 - 99 mg/dL   BUN 42 (*) 6 - 23 mg/dL   Creatinine, Ser 4.09 (*) 0.50 - 1.10 mg/dL   Calcium 9.2  8.4 - 81.1 mg/dL   Total Protein 6.6  6.0 - 8.3 g/dL   Albumin 3.3 (*) 3.5 - 5.2 g/dL   AST 15  0 - 37 U/L   ALT 18  0 - 35 U/L   Alkaline Phosphatase 65  39 - 117 U/L   Total Bilirubin 0.3  0.3 - 1.2 mg/dL   GFR calc non Af Amer 49 (*) >90 mL/min   GFR calc Af Amer 56 (*) >90 mL/min   Comment:            The eGFR has been calculated     using the CKD EPI equation.     This calculation has not been     validated in all clinical     situations.     eGFR's persistently     <90 mL/min signify     possible Chronic Kidney Disease.     HPI :* 67 year old female with history of asbestosis, asthma, hypertension, hyperlipidemia was sent from PCPs office for shortness of breath and wheezing. History was obtained  from the patient who did have difficulty completing sentences due to shortness of breath and wheezing. Patient reported that she started having shortness of breath and wheezing since last night which continued to get progressively worse. She did have slight right-sided chest pain yesterday and took sublingual nitroglycerin which resolved the chest pain. She denied any fever or chills. Typically, patient uses Symbicort and rescue inhaler as needed however last night she she had used her albuterol inhaler twice.  In the ER patient received Solu-Medrol and albuterol nebulizer treatment with mild relief.    HOSPITAL COURSE:  Assessment/Plan:  1. Acute hypoxic respiratory failure; patient SpO2= 94% on nasal cannula of 3 L O2 troponin x3 negative BNP= to 14.8, d-dimer= 1.28 , CT scan  negative for pulmonary embolism Continue scheduled Xopenex and Atrovent nebs, Pulmicort, initiated on Solu-Medrol, subsequently switched to prednisone taper. Initially started on IV Levaquin , completed 5 day course. Morphine available when necessary for feeling of air hunger, , Oxygen requirements and assessed, has been tapered from 3 L to room air, Will check pulse ox and ambulation prior to discharge component of congestive heart failure, or cardiac cath in February 2014, most recent EF is 55-60% , initiated on diuresis with Lasix, patient did have improvement however dropped her blood pressure,  therefore Lasix was discontinued  Doppler negative for DVT, after discussion with cardiology the patient was initiated on Lasix , had to 2250 cc out,BP now soft therefore dc lasix zaroxolyn today and monitor  Discussed plan of care with the son by the bedside  DiscussED with cardiology about patient's recurrent episodes of chest pain , patient was scheduled for elective scan Myoview, nuclear images show no active ischemia    2. HTN; continue home medications with the exception of HCTZ, olmesartan , discontinued diuretics because of low blood pressure, decrease metoprolol to 50 mg a day , may need to reinitiate low-dose Lasix  3. Fibromyalgia syndrome: Large doses of narcotics are contraindicated in fibromyalgia patients will DC Dilaudid continue patient on Norco, meloxicam, Lyrica:    4. polypharmacy patient on multiple psychotropic medications that need to be reviewed by the PCP  5. Headache; Imitrex  6. Hypokalemia replete         Discharge Exam:  Blood pressure 111/59, pulse 77, temperature 97.1 F (36.2 C), temperature source Oral, resp. rate 19, height 5\' 4"  (1.626 m), weight 75.932 kg (167 lb 6.4 oz), SpO2 100.00%. Cardiovascular: Normal rate, regular rhythm, normal heart sounds and intact distal pulses.  Pulmonary/Chest: Effort normal and breath sounds normal. No respiratory  distress.  Abdominal: Soft. Normal appearance and bowel sounds are normal. She exhibits no distension. There is no tenderness.  Musculoskeletal: She exhibits no edema and no tenderness.  Neurological: She is alert. No cranial nerve deficit          Future Appointments Provider Department Dept Phone   07/22/2013 10:00 AM Rachael Fee [facility] [place] [facility] [phone]   07/22/2013 10:30 AM Josph Macho, MD [facility] [place] [phone]   07/22/2013 11:00 AM [facility] [address] [facility] [phone]   07/28/2013 10:30 AM Jeoffrey Massed, MD [facility] [place] [phone]        Signed: Richarda Overlie 07/19/2013, 8:14 AM

## 2013-07-19 NOTE — Progress Notes (Signed)
DC IV, DC Tele, DC Home. Discharge instructions and home medications discussed with patient and patient's family member. Patient and family denied any questions or concerns at this time. Patient leaving unit via wheelchair and appears in no acute distress. 

## 2013-07-19 NOTE — Progress Notes (Signed)
BP continues to run soft today. BP is 107/56. MD notified and ok to hold scheduled metoprolol. No complaints and no signs or symptoms of distress or discomfort. Will continue to monitor patient for further changes in condition.

## 2013-07-20 ENCOUNTER — Emergency Department (HOSPITAL_BASED_OUTPATIENT_CLINIC_OR_DEPARTMENT_OTHER): Payer: Medicare Other

## 2013-07-20 ENCOUNTER — Telehealth: Payer: Self-pay | Admitting: Family Medicine

## 2013-07-20 ENCOUNTER — Encounter (HOSPITAL_BASED_OUTPATIENT_CLINIC_OR_DEPARTMENT_OTHER): Payer: Self-pay

## 2013-07-20 ENCOUNTER — Inpatient Hospital Stay (HOSPITAL_BASED_OUTPATIENT_CLINIC_OR_DEPARTMENT_OTHER)
Admission: EM | Admit: 2013-07-20 | Discharge: 2013-07-26 | DRG: 194 | Disposition: A | Discharge status: Home Health | Payer: Medicare Other | Attending: Internal Medicine | Admitting: Internal Medicine

## 2013-07-20 DIAGNOSIS — I5030 Unspecified diastolic (congestive) heart failure: Secondary | ICD-10-CM | POA: Diagnosis not present

## 2013-07-20 DIAGNOSIS — J61 Pneumoconiosis due to asbestos and other mineral fibers: Secondary | ICD-10-CM | POA: Diagnosis present

## 2013-07-20 DIAGNOSIS — M5137 Other intervertebral disc degeneration, lumbosacral region: Secondary | ICD-10-CM | POA: Diagnosis present

## 2013-07-20 DIAGNOSIS — N179 Acute kidney failure, unspecified: Secondary | ICD-10-CM

## 2013-07-20 DIAGNOSIS — R0602 Shortness of breath: Secondary | ICD-10-CM | POA: Diagnosis not present

## 2013-07-20 DIAGNOSIS — J4531 Mild persistent asthma with (acute) exacerbation: Secondary | ICD-10-CM

## 2013-07-20 DIAGNOSIS — M1990 Unspecified osteoarthritis, unspecified site: Secondary | ICD-10-CM | POA: Diagnosis present

## 2013-07-20 DIAGNOSIS — M5030 Other cervical disc degeneration, unspecified cervical region: Secondary | ICD-10-CM | POA: Diagnosis present

## 2013-07-20 DIAGNOSIS — J189 Pneumonia, unspecified organism: Secondary | ICD-10-CM | POA: Diagnosis not present

## 2013-07-20 DIAGNOSIS — I509 Heart failure, unspecified: Secondary | ICD-10-CM | POA: Diagnosis present

## 2013-07-20 DIAGNOSIS — J449 Chronic obstructive pulmonary disease, unspecified: Secondary | ICD-10-CM | POA: Diagnosis present

## 2013-07-20 DIAGNOSIS — J984 Other disorders of lung: Secondary | ICD-10-CM | POA: Diagnosis not present

## 2013-07-20 DIAGNOSIS — I2510 Atherosclerotic heart disease of native coronary artery without angina pectoris: Secondary | ICD-10-CM | POA: Diagnosis present

## 2013-07-20 DIAGNOSIS — J4541 Moderate persistent asthma with (acute) exacerbation: Secondary | ICD-10-CM

## 2013-07-20 DIAGNOSIS — I10 Essential (primary) hypertension: Secondary | ICD-10-CM | POA: Diagnosis present

## 2013-07-20 DIAGNOSIS — F329 Major depressive disorder, single episode, unspecified: Secondary | ICD-10-CM | POA: Diagnosis present

## 2013-07-20 DIAGNOSIS — R55 Syncope and collapse: Secondary | ICD-10-CM | POA: Diagnosis present

## 2013-07-20 DIAGNOSIS — G8929 Other chronic pain: Secondary | ICD-10-CM | POA: Diagnosis present

## 2013-07-20 DIAGNOSIS — I200 Unstable angina: Secondary | ICD-10-CM | POA: Diagnosis present

## 2013-07-20 DIAGNOSIS — Z91199 Patient's noncompliance with other medical treatment and regimen due to unspecified reason: Secondary | ICD-10-CM

## 2013-07-20 DIAGNOSIS — J4489 Other specified chronic obstructive pulmonary disease: Secondary | ICD-10-CM | POA: Diagnosis present

## 2013-07-20 DIAGNOSIS — Z9119 Patient's noncompliance with other medical treatment and regimen: Secondary | ICD-10-CM | POA: Diagnosis not present

## 2013-07-20 DIAGNOSIS — F3289 Other specified depressive episodes: Secondary | ICD-10-CM | POA: Diagnosis present

## 2013-07-20 DIAGNOSIS — I4510 Unspecified right bundle-branch block: Secondary | ICD-10-CM | POA: Diagnosis present

## 2013-07-20 DIAGNOSIS — J45901 Unspecified asthma with (acute) exacerbation: Secondary | ICD-10-CM

## 2013-07-20 DIAGNOSIS — R0902 Hypoxemia: Secondary | ICD-10-CM

## 2013-07-20 DIAGNOSIS — R0609 Other forms of dyspnea: Secondary | ICD-10-CM | POA: Diagnosis not present

## 2013-07-20 DIAGNOSIS — R5381 Other malaise: Secondary | ICD-10-CM

## 2013-07-20 DIAGNOSIS — F411 Generalized anxiety disorder: Secondary | ICD-10-CM | POA: Diagnosis not present

## 2013-07-20 DIAGNOSIS — G4700 Insomnia, unspecified: Secondary | ICD-10-CM | POA: Diagnosis present

## 2013-07-20 DIAGNOSIS — N3946 Mixed incontinence: Secondary | ICD-10-CM | POA: Diagnosis present

## 2013-07-20 DIAGNOSIS — D649 Anemia, unspecified: Secondary | ICD-10-CM | POA: Diagnosis present

## 2013-07-20 DIAGNOSIS — R0989 Other specified symptoms and signs involving the circulatory and respiratory systems: Secondary | ICD-10-CM

## 2013-07-20 DIAGNOSIS — J9600 Acute respiratory failure, unspecified whether with hypoxia or hypercapnia: Secondary | ICD-10-CM | POA: Diagnosis not present

## 2013-07-20 DIAGNOSIS — J4551 Severe persistent asthma with (acute) exacerbation: Secondary | ICD-10-CM

## 2013-07-20 DIAGNOSIS — E7800 Pure hypercholesterolemia, unspecified: Secondary | ICD-10-CM | POA: Diagnosis present

## 2013-07-20 DIAGNOSIS — Z87440 Personal history of urinary (tract) infections: Secondary | ICD-10-CM

## 2013-07-20 DIAGNOSIS — R091 Pleurisy: Secondary | ICD-10-CM | POA: Diagnosis not present

## 2013-07-20 DIAGNOSIS — R079 Chest pain, unspecified: Secondary | ICD-10-CM | POA: Diagnosis not present

## 2013-07-20 DIAGNOSIS — IMO0001 Reserved for inherently not codable concepts without codable children: Secondary | ICD-10-CM | POA: Diagnosis not present

## 2013-07-20 DIAGNOSIS — G4733 Obstructive sleep apnea (adult) (pediatric): Secondary | ICD-10-CM | POA: Diagnosis present

## 2013-07-20 DIAGNOSIS — R05 Cough: Secondary | ICD-10-CM | POA: Diagnosis not present

## 2013-07-20 DIAGNOSIS — M797 Fibromyalgia: Secondary | ICD-10-CM

## 2013-07-20 DIAGNOSIS — M51379 Other intervertebral disc degeneration, lumbosacral region without mention of lumbar back pain or lower extremity pain: Secondary | ICD-10-CM | POA: Diagnosis present

## 2013-07-20 DIAGNOSIS — J920 Pleural plaque with presence of asbestos: Secondary | ICD-10-CM | POA: Insufficient documentation

## 2013-07-20 HISTORY — DX: Chronic obstructive pulmonary disease, unspecified: J44.9

## 2013-07-20 LAB — URINALYSIS, ROUTINE W REFLEX MICROSCOPIC
Hgb urine dipstick: NEGATIVE
Specific Gravity, Urine: 1.016 (ref 1.005–1.030)
Urobilinogen, UA: 0.2 mg/dL (ref 0.0–1.0)

## 2013-07-20 LAB — TROPONIN I
Troponin I: <0.3 ng/mL (ref <0.30)
Troponin I: <0.3 ng/mL (ref <0.30)

## 2013-07-20 LAB — CBC WITH DIFFERENTIAL/PLATELET
Basophils Relative: 0 % (ref 0–1)
Eosinophils Relative: 0 % (ref 0–5)
Hemoglobin: 11.5 g/dL — ABNORMAL LOW (ref 12.0–15.0)
Lymphs Abs: 3 10*3/uL (ref 0.7–4.0)
MCH: 28.7 pg (ref 26.0–34.0)
MCV: 89 fL (ref 78.0–100.0)
Myelocytes: 2 %
Neutro Abs: 4.5 10*3/uL (ref 1.7–7.7)
RBC: 4.01 MIL/uL (ref 3.87–5.11)

## 2013-07-20 LAB — BLOOD GAS, ARTERIAL
Acid-Base Excess: 2.7 mmol/L — ABNORMAL HIGH (ref 0.0–2.0)
Bicarbonate: 26.7 mEq/L — ABNORMAL HIGH (ref 20.0–24.0)
TCO2: 28 mmol/L (ref 0–100)
pCO2 arterial: 41 mmHg (ref 35.0–45.0)
pO2, Arterial: 57.4 mmHg — ABNORMAL LOW (ref 80.0–100.0)

## 2013-07-20 LAB — PRO B NATRIURETIC PEPTIDE: Pro B Natriuretic peptide (BNP): 63.9 pg/mL (ref 0–125)

## 2013-07-20 LAB — COMPREHENSIVE METABOLIC PANEL
AST: 13 U/L (ref 0–37)
BUN: 34 mg/dL — ABNORMAL HIGH (ref 6–23)
CO2: 27 mEq/L (ref 19–32)
Chloride: 101 mEq/L (ref 96–112)
Creatinine, Ser: 1.1 mg/dL (ref 0.50–1.10)
GFR calc non Af Amer: 51 mL/min — ABNORMAL LOW (ref >90)
Glucose, Bld: 96 mg/dL (ref 70–99)
Total Bilirubin: 0.4 mg/dL (ref 0.3–1.2)

## 2013-07-20 LAB — CK: Total CK: 155 U/L (ref 7–177)

## 2013-07-20 MED ORDER — PREDNISONE 20 MG PO TABS
40.0000 mg | ORAL_TABLET | Freq: Every day | ORAL | Status: DC
  Administered 2013-07-21: 40 mg via ORAL
  Filled 2013-07-20 (×3): qty 2

## 2013-07-20 MED ORDER — SODIUM CHLORIDE 0.9 % IJ SOLN
3.0000 mL | Freq: Two times a day (BID) | INTRAMUSCULAR | Status: DC
  Administered 2013-07-20 – 2013-07-25 (×10): 3 mL via INTRAVENOUS

## 2013-07-20 MED ORDER — ENOXAPARIN SODIUM 40 MG/0.4ML ~~LOC~~ SOLN
40.0000 mg | SUBCUTANEOUS | Status: DC
  Administered 2013-07-20 – 2013-07-25 (×6): 40 mg via SUBCUTANEOUS
  Filled 2013-07-20 (×8): qty 0.4

## 2013-07-20 MED ORDER — ACETAMINOPHEN 325 MG PO TABS
650.0000 mg | ORAL_TABLET | Freq: Four times a day (QID) | ORAL | Status: DC | PRN
  Administered 2013-07-23: 650 mg via ORAL
  Filled 2013-07-20: qty 2

## 2013-07-20 MED ORDER — ALBUTEROL SULFATE (5 MG/ML) 0.5% IN NEBU
2.5000 mg | INHALATION_SOLUTION | Freq: Four times a day (QID) | RESPIRATORY_TRACT | Status: DC
  Administered 2013-07-20 – 2013-07-25 (×19): 2.5 mg via RESPIRATORY_TRACT
  Filled 2013-07-20 (×18): qty 0.5

## 2013-07-20 MED ORDER — ACETAMINOPHEN 650 MG RE SUPP: 650.0000 mg | Freq: Four times a day (QID) | RECTAL | Status: DC | PRN

## 2013-07-20 MED ORDER — HYDROCODONE-ACETAMINOPHEN 5-325 MG PO TABS
1.0000 | ORAL_TABLET | ORAL | Status: DC | PRN
  Administered 2013-07-20 – 2013-07-21 (×2): 2 via ORAL
  Administered 2013-07-21: 1 via ORAL
  Administered 2013-07-21 – 2013-07-23 (×9): 2 via ORAL
  Administered 2013-07-23: 1 via ORAL
  Administered 2013-07-23 (×2): 2 via ORAL
  Administered 2013-07-23: 1 via ORAL
  Administered 2013-07-23 – 2013-07-24 (×4): 2 via ORAL
  Filled 2013-07-20 (×4): qty 2
  Filled 2013-07-20: qty 1
  Filled 2013-07-20 (×15): qty 2

## 2013-07-20 MED ORDER — MILNACIPRAN HCL 50 MG PO TABS
50.0000 mg | ORAL_TABLET | Freq: Two times a day (BID) | ORAL | Status: DC
  Administered 2013-07-21 – 2013-07-26 (×12): 50 mg via ORAL
  Filled 2013-07-20 (×13): qty 1

## 2013-07-20 MED ORDER — ASPIRIN EC 81 MG PO TBEC
81.0000 mg | DELAYED_RELEASE_TABLET | Freq: Every day | ORAL | Status: DC
  Administered 2013-07-20 – 2013-07-26 (×7): 81 mg via ORAL
  Filled 2013-07-20 (×7): qty 1

## 2013-07-20 MED ORDER — PIPERACILLIN-TAZOBACTAM 3.375 G IVPB
3.3750 g | Freq: Once | INTRAVENOUS | Status: AC
  Administered 2013-07-20: 3.375 g via INTRAVENOUS
  Filled 2013-07-20: qty 50

## 2013-07-20 MED ORDER — VANCOMYCIN HCL IN DEXTROSE 1-5 GM/200ML-% IV SOLN
1000.0000 mg | Freq: Once | INTRAVENOUS | Status: AC
  Administered 2013-07-20: 1000 mg via INTRAVENOUS
  Filled 2013-07-20 (×2): qty 200

## 2013-07-20 MED ORDER — METOPROLOL SUCCINATE ER 25 MG PO TB24
25.0000 mg | ORAL_TABLET | Freq: Every day | ORAL | Status: DC
  Administered 2013-07-21: 25 mg via ORAL
  Filled 2013-07-20 (×2): qty 1

## 2013-07-20 MED ORDER — NITROGLYCERIN 0.4 MG SL SUBL
0.4000 mg | SUBLINGUAL_TABLET | SUBLINGUAL | Status: DC | PRN
  Administered 2013-07-25 (×3): 0.4 mg via SUBLINGUAL
  Filled 2013-07-20 (×2): qty 25

## 2013-07-20 MED ORDER — MORPHINE SULFATE 2 MG/ML IJ SOLN
2.0000 mg | Freq: Once | INTRAMUSCULAR | Status: AC
  Administered 2013-07-20: 2 mg via INTRAVENOUS
  Filled 2013-07-20: qty 1

## 2013-07-20 MED ORDER — PREGABALIN 50 MG PO CAPS
300.0000 mg | ORAL_CAPSULE | Freq: Two times a day (BID) | ORAL | Status: DC
  Administered 2013-07-20 – 2013-07-26 (×12): 300 mg via ORAL
  Filled 2013-07-20: qty 6
  Filled 2013-07-20: qty 5
  Filled 2013-07-20 (×4): qty 6
  Filled 2013-07-20: qty 1
  Filled 2013-07-20: qty 6
  Filled 2013-07-20: qty 12
  Filled 2013-07-20 (×4): qty 6

## 2013-07-20 MED ORDER — CLOPIDOGREL BISULFATE 75 MG PO TABS
75.0000 mg | ORAL_TABLET | Freq: Every day | ORAL | Status: DC
  Administered 2013-07-21 – 2013-07-26 (×6): 75 mg via ORAL
  Filled 2013-07-20 (×7): qty 1

## 2013-07-20 MED ORDER — ALPRAZOLAM 0.5 MG PO TABS
1.0000 mg | ORAL_TABLET | ORAL | Status: DC
  Administered 2013-07-21 – 2013-07-26 (×12): 1 mg via ORAL
  Filled 2013-07-20 (×3): qty 2
  Filled 2013-07-20: qty 4
  Filled 2013-07-20: qty 2
  Filled 2013-07-20: qty 1
  Filled 2013-07-20 (×3): qty 2
  Filled 2013-07-20 (×2): qty 1
  Filled 2013-07-20 (×2): qty 2
  Filled 2013-07-20: qty 1
  Filled 2013-07-20: qty 2
  Filled 2013-07-20: qty 1
  Filled 2013-07-20 (×2): qty 2
  Filled 2013-07-20: qty 1

## 2013-07-20 MED ORDER — ALBUTEROL SULFATE (5 MG/ML) 0.5% IN NEBU
5.0000 mg | INHALATION_SOLUTION | Freq: Once | RESPIRATORY_TRACT | Status: AC
  Administered 2013-07-20: 5 mg via RESPIRATORY_TRACT
  Filled 2013-07-20: qty 1

## 2013-07-20 MED ORDER — SODIUM CHLORIDE 0.9 % IV BOLUS (SEPSIS)
500.0000 mL | Freq: Once | INTRAVENOUS | Status: AC
  Administered 2013-07-20: 500 mL via INTRAVENOUS

## 2013-07-20 MED ORDER — GUAIFENESIN ER 600 MG PO TB12
600.0000 mg | ORAL_TABLET | Freq: Two times a day (BID) | ORAL | Status: DC
  Administered 2013-07-20 – 2013-07-23 (×7): 600 mg via ORAL
  Filled 2013-07-20 (×9): qty 1

## 2013-07-20 MED ORDER — FUROSEMIDE 10 MG/ML IJ SOLN
40.0000 mg | Freq: Once | INTRAMUSCULAR | Status: AC
  Administered 2013-07-20: 40 mg via INTRAVENOUS
  Filled 2013-07-20: qty 4

## 2013-07-20 MED ORDER — IPRATROPIUM BROMIDE 0.02 % IN SOLN
0.5000 mg | Freq: Four times a day (QID) | RESPIRATORY_TRACT | Status: DC
  Administered 2013-07-20 – 2013-07-25 (×19): 0.5 mg via RESPIRATORY_TRACT
  Filled 2013-07-20 (×18): qty 2.5

## 2013-07-20 MED ORDER — TRAZODONE HCL 50 MG PO TABS
50.0000 mg | ORAL_TABLET | Freq: Every day | ORAL | Status: DC
  Administered 2013-07-20 – 2013-07-25 (×6): 50 mg via ORAL
  Filled 2013-07-20 (×9): qty 1

## 2013-07-20 MED ORDER — BUDESONIDE-FORMOTEROL FUMARATE 160-4.5 MCG/ACT IN AERO
2.0000 | INHALATION_SPRAY | Freq: Two times a day (BID) | RESPIRATORY_TRACT | Status: DC
  Administered 2013-07-20 – 2013-07-23 (×6): 2 via RESPIRATORY_TRACT
  Filled 2013-07-20: qty 6

## 2013-07-20 MED ORDER — ALPRAZOLAM 0.5 MG PO TABS
0.5000 mg | ORAL_TABLET | Freq: Every day | ORAL | Status: DC
  Administered 2013-07-20 – 2013-07-25 (×6): 0.5 mg via ORAL
  Filled 2013-07-20 (×2): qty 1

## 2013-07-20 MED ORDER — ATORVASTATIN CALCIUM 20 MG PO TABS
20.0000 mg | ORAL_TABLET | Freq: Every day | ORAL | Status: DC
  Administered 2013-07-20 – 2013-07-25 (×6): 20 mg via ORAL
  Filled 2013-07-20 (×7): qty 1

## 2013-07-20 NOTE — Telephone Encounter (Signed)
Patient is having trouble breathing, can an order be placed for apparatus & proventil so that patient can do breathing treatments at home? She also needs an Rx for Lasix. They gave it to her in the hospital but did not give her an Rx. Patient woke up a little swollen this morning. Please send Rx to [facility] [place] [facility]. Please contact patient's nephew today if possible, he has volunteered to pick up the machine today. He knows how to use it since his children have asthma & they use the machine.

## 2013-07-20 NOTE — Telephone Encounter (Signed)
This patient was just triaged to go to the ED based on report of SOB and swelling. I will also be glad to rx a neb machine and albuterol neb solution. Lisa, pls arrange for her to get a nebulizer with kit/supplies via [facility]. Pls do eRx for albuterol 0.0083% neb solution, 1 unit dose via nebulizer q6h prn wheezing/SOB, #75, RF x 1. May also do lasix 40mg , 1 tab po qd.  Need BMET this Thursday or Friday.--thx

## 2013-07-20 NOTE — Progress Notes (Signed)
Accepted from [facility]- just d/c'd yest-- worsening SOB, ? PNA vs CHF- +swelling Tele bed  Marlin Canary

## 2013-07-20 NOTE — ED Notes (Signed)
Patient undressed, on cardiac monitor and blankets have been given for comfort.

## 2013-07-20 NOTE — Progress Notes (Signed)
Shift event: RN paged NP aroun [phone] because pt wants to go home. This NP called and spoke to pt on the phone. Linda [name] says her husband is in [facility] and she wants to leave to go there and spend time with him. I explained that while I empathize with her situation, I do not feel she is stable enough to leave the hospital. I explained that if she does leave, she will have to leave AMA. Pt expresses issues with family "pulling her in different directions". I explained that her attending physician didn't feel it was safe for her to leave today, or he would have d/c her.  I checked back with RN at 2100 and pt is still here. Linda Norman, NP [facility]

## 2013-07-20 NOTE — ED Provider Notes (Signed)
CSN: [id number]     Arrival date & time 07/20/13  1243 History     First MD Initiated Contact with Patient 07/20/13 1313     Chief Complaint  Patient presents with  . Shortness of Breath  . Edema   (Consider location/radiation/quality/duration/timing/severity/associated sxs/prior Treatment) Patient is a 67 y.o. female presenting with shortness of breath. The history is provided by the patient. No language interpreter was used.  Shortness of Breath Severity:  Moderate Onset quality:  Gradual Duration:  1 day Timing:  Constant Progression:  Worsening Chronicity:  New Relieved by:  Nothing Worsened by:  Nothing tried Ineffective treatments:  Inhaler Associated symptoms: no chest pain   Pt complains of increased shortness of breath.   Pt feels like she is swelling.  Pt had lasix until discharged from hospital yesterday.  Pt complains of weakness, cough and shortness of breath is returning  Past Medical History  Diagnosis Date  . HTN (hypertension)   . Depression   . Recurrent UTI     +hx of hospitalization for pyelonephritis  . Hay fever   . Mixed incontinence urge and stress   . Diverticular disease   . Insomnia   . Fibromyalgia     Patient states dx was around her late 85s but she had sx's for years prior to this.  . Syncope     +Hypotensive; ED visit--Dr. Sharyn Lull did Cath--nonobstructive CAD, EF 55-60%  . Idiopathic angio-edema-urticaria [id number]    Angioedema component was very minimal  . 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 Asthma     + asbestososis   . Pneumonia     hospitalized 12/2011 for this  . High cholesterol     "I'm on Lipitor cause it will help my heart in some way" (07/12/2013)  . Anginal pain   . Shortness of breath     "all the time recently" (07/12/2013)  . OSA on CPAP   . Iron deficiency anemia     Hematologist in [place], [place] did extensive w/u; no cause  found; failed oral supplement;; gets fairly regular (q14m or so) IV iron infusions (Venofer iron sucrose200mg  with procrit  . Iron deficiency anemia, unspecified     "for 14 yr I've been getting blood work q month & getting infusions prn" (07/12/2013)  . H/O hiatal hernia   . Migraine syndrome     "not as often anymore; used to be ~ q wk" (07/12/2013)  . Tension headache, chronic   . DDD (degenerative disc disease), lumbar   . Osteoarthritis     "severe; progressing fast" (07/12/2013)  . DDD (degenerative disc disease), cervical   . Chronic lower back pain   . Anxiety   . Panic attacks   . Nephrolithiasis     "passed all on my own or they are still in there" (07/12/2013)  . Pyelonephritis     "several times over the last 30 yr" (07/12/2013)  . CHF (congestive heart failure)   . COPD (chronic obstructive pulmonary disease)    Past Surgical History  Procedure Laterality Date  . Appendectomy  1960  . Total abdominal hysterectomy  1974  . Tendon release  1996    Right forearm and hand  . Knee surgery  2005  . Heel spur surgery Left 2008  . Plantar fascia release Left 2008  . Axillary surgery Left 1978    Multiple "  lump" in armpit per pt  . Coccyx removal  1972  . Cardiac catheterization  01/2013    nonobstructive CAD, EF 55-60%  . Transthoracic echocardiogram  01/2013    NORMAL  . Dilation and curettage of uterus  ? 1970's  . Eye surgery Left 2012-2013    "injections for ~ 1 yr; don't really know what for" (07/12/2013)   Family History  Problem Relation Age of Onset  . Arthritis Mother   . Kidney disease Mother   . Heart disease Father   . Stroke Father   . Hypertension Father   . Diabetes Father    History  Substance Use Topics  . Smoking status: Never Smoker   . Smokeless tobacco: Never Used  . Alcohol Use: No   OB History   Grav Para Term Preterm Abortions TAB SAB Ect Mult Living                 Review of Systems  Respiratory: Positive for shortness of breath.    Cardiovascular: Negative for chest pain.  All other systems reviewed and are negative.    Allergies  Penicillins  Home Medications   Current Outpatient Rx  Name  Route  Sig  Dispense  Refill  . albuterol (PROVENTIL HFA;VENTOLIN HFA) 108 (90 BASE) MCG/ACT inhaler   Inhalation   Inhale 2 puffs into the lungs every 6 (six) hours as needed for wheezing. For shortness of breath   1 Inhaler   2   . ALPRAZolam (XANAX) 1 MG tablet      1 mg. 1 tab in AM and Noon and 1/2 tab in PM and Bedtime.         [name] Kitchen amitriptyline (ELAVIL) 100 MG tablet   Oral   Take 50 mg by mouth at bedtime.         [name] Kitchen aspirin EC 81 MG EC tablet   Oral   Take 1 tablet (81 mg total) by mouth daily.   30 tablet   3   . atorvastatin (LIPITOR) 20 MG tablet   Oral   Take 1 tablet (20 mg total) by mouth daily at 6 PM.   30 tablet   6   . budesonide-formoterol (SYMBICORT) 160-4.5 MCG/ACT inhaler   Inhalation   Inhale 2 puffs into the lungs 2 (two) times daily.   1 Inhaler   12   . clopidogrel (PLAVIX) 75 MG tablet   Oral   Take 1 tablet (75 mg total) by mouth daily with breakfast.   30 tablet   3   . guaiFENesin (MUCINEX) 600 MG 12 hr tablet   Oral   Take 1 tablet (600 mg total) by mouth 2 (two) times daily.   30 tablet   0   . meloxicam (MOBIC) 7.5 MG tablet   Oral   Take 7.5 mg by mouth 2 (two) times daily.         . metoprolol succinate (TOPROL-XL) 25 MG 24 hr tablet   Oral   Take 1 tablet (25 mg total) by mouth daily. Take with or immediately following a meal.   30 tablet   2   . Milnacipran (SAVELLA) 50 MG TABS   Oral   Take 1 tablet (50 mg total) by mouth 2 (two) times daily.   60 tablet   3   . Multiple Vitamins-Minerals (CENTRUM SILVER ADULT 50+ PO)   Oral   Take 1 tablet by mouth daily.         [name] Kitchen  nitroGLYCERIN (NITROSTAT) 0.4 MG SL tablet   Sublingual   Place 1 tablet (0.4 mg total) under the tongue every 5 (five) minutes x 3 doses as needed for chest pain.   60  tablet   3   . predniSONE (DELTASONE) 20 MG tablet   Oral   Take 2 tablets (40 mg total) by mouth daily with breakfast.   10 tablet   0   . pregabalin (LYRICA) 300 MG capsule   Oral   Take 1 capsule (300 mg total) by mouth 2 (two) times daily.   60 capsule   6   . traZODone (DESYREL) 50 MG tablet      1-2 tabs po qhs for insomnia   30 tablet   0    BP 101/57  Pulse 70  Temp(Src) 97.5 F (36.4 C) (Oral)  Resp 16  Wt 167 lb (75.751 kg)  BMI 28.65 kg/m2  SpO2 95% Physical Exam  Nursing note and vitals reviewed. Constitutional: She is oriented to person, place, and time. She appears well-developed and well-nourished.  HENT:  Head: Normocephalic and atraumatic.  Eyes: Conjunctivae are normal. Pupils are equal, round, and reactive to light.  Neck: Normal range of motion. Neck supple.  Cardiovascular: Normal rate and regular rhythm.   Pulmonary/Chest: Effort normal. She has wheezes.  Abdominal: Soft.  Musculoskeletal: Normal range of motion.  Neurological: She is alert and oriented to person, place, and time. She has normal reflexes.  Skin: Skin is warm.  Psychiatric: She has a normal mood and affect.    ED Course   Procedures (including critical care time)  Labs Reviewed  CBC WITH DIFFERENTIAL - Abnormal; Notable for the following:    Hemoglobin 11.5 (*)    HCT 35.7 (*)    RDW 17.9 (*)    All other components within normal limits  COMPREHENSIVE METABOLIC PANEL - Abnormal; Notable for the following:    BUN 34 (*)    Albumin 3.3 (*)    GFR calc non Af Amer 51 (*)    GFR calc Af Amer 59 (*)    All other components within normal limits  PRO B NATRIURETIC PEPTIDE  TROPONIN I   Dg Chest 2 View  07/20/2013   *RADIOLOGY REPORT*  Clinical Data: Shortness of breath  CHEST - 2 VIEW  Comparison: July 12, 2013  Findings: There is no pulmonary edema, or pleural effusion.  There are stable bilateral pleural calcifications. There is interval developed patchy consolidation  of the medial left lung base.  The mediastinal contour and cardiac silhouette are normal.  The soft tissues and osseous structures are stable.  IMPRESSION: Interval developed patchy consolidation of the medial left lung base suspicious for pneumonia.  Stable bilateral calcified pleural plaques.   Original Report Authenticated By: Sherian Rein, M.D.   1. Healthcare-associated pneumonia    Date: 07/20/2013  Rate: 75  Rhythm: normal sinus rhythm  QRS Axis: normal  Intervals: normal  ST/T Wave abnormalities: normal  Conduction Disutrbances:right bundle branch block  Narrative Interpretation:   Old EKG Reviewed: none available   MDM  Xray show patchy consolidation,  Suspect new hospital acquired pneumonia.   IV antibiotics started,   Consult to hospitalist for admission  Elson Areas, [place] 07/20/13 1535

## 2013-07-20 NOTE — ED Notes (Signed)
Pt was d/c'd from [facility] yesterday and has developed [facility] and increased swelling this am.  Family reports pt received Lasix in hospital but was not given an RX for Lasix.

## 2013-07-20 NOTE — ED Provider Notes (Signed)
History/physical exam/procedure(s) were performed by non-physician practitioner and as supervising physician I was immediately available for consultation/collaboration. I have reviewed all notes and am in agreement with care and plan.   Reginald Mangels S Dacey Milberger, MD 07/20/13 1601 

## 2013-07-20 NOTE — H&P (Signed)
[facility] History and Physical  Linda [name] ZOX:[id number] DOB: [birth year] DOA: 07/20/2013   PCP: Jeoffrey Massed, MD   Chief Complaint: Generalized weakness and shortness of breath  HPI:  67 year old female with a history of asbestosis, hypertension, depression, fibromyalgia, hyperlipidemia, and diastolic CHF was discharged from the hospital on 07/19/2013. Her hospital stay was significant for respiratory failure which was thought to be multifactorial including COPD with a possible component of heart failure. The patient completed 5 days of Levaquin and was sent home with a prednisone taper. CT angiogram of the chest on 07/13/2013 was negative for pulmonary embolus but showed increased pulmonary vascularity and scattered groundglass opacities in the bilateral upper lung sounds. The patient also had a Myoview performed which was negative for any reversible ischemia. EF on the Myoview was 91%. Sonogram of the lower extremities was negative for DVT. The patient was doing well on the night after discharge. When she woke up this morning, the patient had generalized weakness and shortness of breath with exertion. She went to the bathroom and had difficulty getting up and needed assistance. There was no worsening fevers, chills, chest discomfort, headache, dizziness, syncope, nausea, vomiting, diarrhea, diaphoresis.  Because of the above symptoms, the patient's family brought the patient back to the emergency department at [facility]. Evaluation in the ED revealed a chest x-ray that suggested a left medial base consolidation which was new. WBC was 8.1. BMP was unremarkable with a serum creatinine of 1.10. Hepatic enzymes were negative. EKG showed an unchanged RBBB. And hepatic enzymes were negative. Assessment/Plan: Pulmonary infiltrate -I am not convinced that the patient has HCAP -Although it was interpreted as a new left base consolidation, the patient has no tachycardia, no fever, no  leukocytosis - I will not start the patient on any additional antibiotics at this time -My personal review of the chest x-ray did not reveal any significant changes when compared to previous chest x-ray on 07/12/2013 Dyspnea -Ambulatory to pulse ox prior to the patient's discharge did not show desaturation -Currently oxygen saturation is 96% on room air--the patient is comfortable without any dyspnea during my examination and interview -Check ABG on room air -Suspect a degree of deconditioning -No wheezing on physical examination -ProBNP 63 without any peripheral edema or orthopnea -Patient certainly may be short of breath to to her pleural plaques and history of asbestosis -CT angiogram chest 07/13/2013 negative for pulmonary embolus -Venous Doppler lower extremities negative for DVT -PT evaluation -Cycle troponins Acute kidney injury -Would not give the patient any furosemide or ARB at this time -The patient's ARB and furosemide were discontinued at the time of discharge -Patient did receive intravenous furosemide during her last hospitalization which likely accounted for her increased serum creatinine -There was a working diagnosis of fluid overload during the last hospitalization, but currently the patient does not appear fluid overloaded -Recheck BMP in the morning Fibromyalgia -Continue Lyrica and Savella -Continue trazodone and Xanax Generalized weakness -Urinalysis did not show pyuria -Likely deconditioning -No focal neurologic deficits on exam -Check CPK to rule out rhabdomyolysis -Check TSH -Continue prednisone taper   PMHx: Fibromyalgia Hypertension Asthma [name] Osteoarthritis BPPV Iron deficiency anemia Past Surgical History  Procedure Laterality Date  . Appendectomy  1960  . Total abdominal hysterectomy  1974  . Tendon release  1996    Right forearm and hand  . Knee surgery  2005  . Heel spur surgery Left 2008  . Plantar fascia release Left 2008  .  Axillary surgery Left 1978  Multiple "lump" in armpit per pt  . Coccyx removal  1972  . Cardiac catheterization  01/2013    nonobstructive CAD, EF 55-60%  . Transthoracic echocardiogram  01/2013    NORMAL  . Dilation and curettage of uterus  ? 1970's  . Eye surgery Left 2012-2013    "injections for ~ 1 yr; don't really know what for" (07/12/2013)   Social History:  reports that she has never smoked. She has never used smokeless tobacco. She reports that she does not drink alcohol or use illicit drugs.   Family History  Problem Relation Age of Onset  . Arthritis Mother   . Kidney disease Mother   . Heart disease Father   . Stroke Father   . Hypertension Father   . Diabetes Father      Allergies  Allergen Reactions  . Penicillins Itching, Swelling and Rash      Prior to Admission medications   Medication Sig Start Date End Date Taking? Authorizing Provider  albuterol (PROVENTIL HFA;VENTOLIN HFA) 108 (90 BASE) MCG/ACT inhaler Inhale 2 puffs into the lungs every 6 (six) hours as needed for wheezing. For shortness of breath 07/19/13  Yes Richarda Overlie, MD  ALPRAZolam Prudy Feeler) 1 MG tablet Take 0.5-1 mg by mouth 3 (three) times daily. 1mg  in a.m. And noon and 0.5 mg in pm   Yes Historical Provider, MD  amitriptyline (ELAVIL) 100 MG tablet Take 50 mg by mouth at bedtime. 04/22/13  Yes Jeoffrey Massed, MD  aspirin EC 81 MG EC tablet Take 1 tablet (81 mg total) by mouth daily. 01/31/13  Yes Robynn Pane, MD  atorvastatin (LIPITOR) 20 MG tablet Take 1 tablet (20 mg total) by mouth daily at 6 PM. 06/28/13  Yes Jeoffrey Massed, MD  budesonide-formoterol (SYMBICORT) 160-4.5 MCG/ACT inhaler Inhale 2 puffs into the lungs 2 (two) times daily. 07/19/13  Yes Richarda Overlie, MD  clopidogrel (PLAVIX) 75 MG tablet Take 1 tablet (75 mg total) by mouth daily with breakfast. 01/31/13  Yes Robynn Pane, MD  guaiFENesin (MUCINEX) 600 MG 12 hr tablet Take 1 tablet (600 mg total) by mouth 2 (two) times daily.  07/19/13  Yes Richarda Overlie, MD  meloxicam (MOBIC) 7.5 MG tablet Take 7.5 mg by mouth 2 (two) times daily.   Yes Historical Provider, MD  metoprolol succinate (TOPROL-XL) 25 MG 24 hr tablet Take 1 tablet (25 mg total) by mouth daily. Take with or immediately following a meal. 07/19/13  Yes Richarda Overlie, MD  Milnacipran (SAVELLA) 50 MG TABS Take 1 tablet (50 mg total) by mouth 2 (two) times daily. 03/25/13  Yes Jeoffrey Massed, MD  Multiple Vitamins-Minerals (CENTRUM SILVER ADULT 50+ PO) Take 1 tablet by mouth daily.   Yes Historical Provider, MD  nitroGLYCERIN (NITROSTAT) 0.4 MG SL tablet Place 1 tablet (0.4 mg total) under the tongue every 5 (five) minutes x 3 doses as needed for chest pain. 07/19/13  Yes Richarda Overlie, MD  predniSONE (DELTASONE) 20 MG tablet Take 2 tablets (40 mg total) by mouth daily with breakfast. 07/19/13 07/22/13 Yes Richarda Overlie, MD  pregabalin (LYRICA) 300 MG capsule Take 1 capsule (300 mg total) by mouth 2 (two) times daily. 03/10/13  Yes Jeoffrey Massed, MD  traZODone (DESYREL) 50 MG tablet Take 50-100 mg by mouth at bedtime. Pt can take up to 2 tab for insomnia.   Yes Historical Provider, MD    Review of Systems:  Constitutional:  No weight loss, night sweats, Fevers, chills,  fatigue.  Head&Eyes: No headache.  No vision loss.  No eye pain or scotoma ENT:  No Difficulty swallowing,Tooth/dental problems,Sore throat,   Cardio-vascular:  No chest pain, Orthopnea, PND,dizziness, palpitations  GI:  No  abdominal pain, nausea, vomiting, diarrhea, loss of appetite, hematochezia, melena, heartburn, indigestion, Resp:  No cough. No coughing up of blood .No wheezing.No chest wall deformity  Skin:  no rash or lesions.  GU:  no dysuria, change in color of urine, no urgency or frequency. No flank pain.  Musculoskeletal:  No joint pain or swelling. No decreased range of motion. Chronic bilateral leg pain  Psych:  No change in mood or affect.  Neurologic: No headache, no  dysesthesia, no focal weakness, no vision loss. No syncope  Physical Exam: Filed Vitals:   07/20/13 1343 07/20/13 1418 07/20/13 1504 07/20/13 1747  BP: 101/57  111/54 124/66  Pulse: 70  76 71  Temp:    97.8 F (36.6 C)  TempSrc:    Oral  Resp: 16  16   Weight:      SpO2: 96% 95%  96%   General:  A&O x 3, NAD, nontoxic, pleasant/cooperative Head/Eye: No conjunctival hemorrhage, no icterus, [place]/AT, No nystagmus ENT:  No icterus,  No thrush, good dentition, no pharyngeal exudate Neck:  No masses, no lymphadenpathy, no bruits CV:  RRR, no rub, no gallop, no S3 Lung:  R>L crackles, no wheeze, good air movement Abdomen: soft/NT, +BS, nondistended, no peritoneal signs Ext: No cyanosis, No rashes, No petechiae, No lymphangitis, No edema Neuro: CNII-XII intact, strength 4/5 in bilateral upper and lower extremities, no dysmetria  Labs on Admission:  Basic Metabolic Panel:  Recent Labs Lab 07/15/13 0515 07/16/13 0557 07/18/13 0515 07/19/13 0425 07/20/13 1335  NA 140 139 139 138 139  K 2.8* 3.5 3.0* 4.2 4.1  CL 98 98 94* 99 101  CO2 31 31 32 29 27  GLUCOSE 162* 96 109* 144* 96  BUN 28* 31* 47* 42* 34*  CREATININE 0.68 0.83 1.29* 1.14* 1.10  CALCIUM 9.2 8.8 10.0 9.2 9.2   Liver Function Tests:  Recent Labs Lab 07/19/13 0425 07/20/13 1335  AST 15 13  ALT 18 16  ALKPHOS 65 60  BILITOT 0.3 0.4  PROT 6.6 6.2  ALBUMIN 3.3* 3.3*   No results found for this basename: LIPASE, AMYLASE,  in the last 168 hours No results found for this basename: AMMONIA,  in the last 168 hours CBC:  Recent Labs Lab 07/20/13 1335  WBC 8.1  NEUTROABS 4.5  HGB 11.5*  HCT 35.7*  MCV 89.0  PLT 180   Cardiac Enzymes:  Recent Labs Lab 07/14/13 1305 07/18/13 0800 07/20/13 1335  TROPONINI <0.30 <0.30 <0.30   BNP: No components found with this basename: POCBNP,  CBG: No results found for this basename: GLUCAP,  in the last 168 hours  Radiological Exams on Admission: Dg Chest 2  View  07/20/2013   *RADIOLOGY REPORT*  Clinical Data: Shortness of breath  CHEST - 2 VIEW  Comparison: July 12, 2013  Findings: There is no pulmonary edema, or pleural effusion.  There are stable bilateral pleural calcifications. There is interval developed patchy consolidation of the medial left lung base.  The mediastinal contour and cardiac silhouette are normal.  The soft tissues and osseous structures are stable.  IMPRESSION: Interval developed patchy consolidation of the medial left lung base suspicious for pneumonia.  Stable bilateral calcified pleural plaques.   Original Report Authenticated By: Sherian Rein, M.D.  EKG: Independently reviewed. Sinus, RBBB    Time spent:70 minutes Code Status:   FULL Family Communication:   Family at bedside   Mairin Lindsley, DO  [facility] Pager [phone]  If 7PM-7AM, please contact night-coverage [email or web address] Password [facility] 07/20/2013, 6:52 PM

## 2013-07-21 ENCOUNTER — Encounter (HOSPITAL_COMMUNITY): Payer: Self-pay | Admitting: General Practice

## 2013-07-21 LAB — CBC
HCT: 36.4 % (ref 36.0–46.0)
Hemoglobin: 12.1 g/dL (ref 12.0–15.0)
MCH: 28.9 pg (ref 26.0–34.0)
MCHC: 33.2 g/dL (ref 30.0–36.0)
MCV: 87.1 fL (ref 78.0–100.0)
Platelets: 180 10*3/uL (ref 150–400)
RBC: 4.18 MIL/uL (ref 3.87–5.11)
RDW: 17.8 % — ABNORMAL HIGH (ref 11.5–15.5)
WBC: 9.4 10*3/uL (ref 4.0–10.5)

## 2013-07-21 LAB — BASIC METABOLIC PANEL WITH GFR
BUN: 33 mg/dL — ABNORMAL HIGH (ref 6–23)
CO2: 28 meq/L (ref 19–32)
Calcium: 8.6 mg/dL (ref 8.4–10.5)
Chloride: 97 meq/L (ref 96–112)
Creatinine, Ser: 1.16 mg/dL — ABNORMAL HIGH (ref 0.50–1.10)
GFR calc Af Amer: 55 mL/min — ABNORMAL LOW
GFR calc non Af Amer: 48 mL/min — ABNORMAL LOW
Glucose, Bld: 123 mg/dL — ABNORMAL HIGH (ref 70–99)
Potassium: 3.6 meq/L (ref 3.5–5.1)
Sodium: 137 meq/L (ref 135–145)

## 2013-07-21 LAB — TROPONIN I: Troponin I: <0.3 ng/mL (ref <0.30)

## 2013-07-21 LAB — TSH: TSH: 3.736 u[IU]/mL (ref 0.350–4.500)

## 2013-07-21 MED ORDER — ONDANSETRON HCL 4 MG/2ML IJ SOLN
4.0000 mg | Freq: Four times a day (QID) | INTRAMUSCULAR | Status: DC | PRN
  Administered 2013-07-21 – 2013-07-23 (×2): 4 mg via INTRAVENOUS
  Filled 2013-07-21 (×2): qty 2

## 2013-07-21 MED ORDER — METHYLPREDNISOLONE SODIUM SUCC 125 MG IJ SOLR
60.0000 mg | Freq: Three times a day (TID) | INTRAMUSCULAR | Status: DC
  Administered 2013-07-21 – 2013-07-23 (×7): 60 mg via INTRAVENOUS
  Filled 2013-07-21 (×9): qty 0.96

## 2013-07-21 MED ORDER — BUPIVACAINE-EPINEPHRINE PF 0.25-1:200000 % IJ SOLN
INTRAMUSCULAR | Status: AC
  Filled 2013-07-21: qty 30

## 2013-07-21 MED ORDER — NEBIVOLOL HCL 5 MG PO TABS: 5.0000 mg | ORAL_TABLET | Freq: Every day | ORAL | Status: DC

## 2013-07-21 MED ORDER — THROMBIN 20000 UNITS EX SOLR
CUTANEOUS | Status: AC
  Filled 2013-07-21: qty 40000

## 2013-07-21 NOTE — Progress Notes (Addendum)
[facility] PROGRESS NOTE  BREELYN ICARD WJX:[id number] DOB: 23-Dec-1946 DOA: 07/20/2013  PCP: Jeoffrey Massed, MD  Brief HPI: 67 year old female with a history of asbestosis, Asthma, hypertension, depression, fibromyalgia, hyperlipidemia, and diastolic CHF was discharged from the hospital on 07/19/2013. Her hospital stay was significant for respiratory failure which was thought to be multifactorial including COPD with a possible component of heart failure. The patient completed 5 days of Levaquin and was sent home with a prednisone taper. CT angiogram of the chest on 07/13/2013 was negative for pulmonary embolus but showed increased pulmonary vascularity and scattered groundglass opacities in the bilateral upper lung sounds. The patient also had a Myoview performed which was negative for any reversible ischemia. EF on the Myoview was 91%. Sonogram of the lower extremities was negative for DVT. The patient was doing well on the night after discharge. When she woke up the morning of admission, the patient had generalized weakness and shortness of breath with exertion. She went to the bathroom and had difficulty getting up and needed assistance. There was no worsening fevers, chills, chest discomfort, headache, dizziness, syncope, nausea, vomiting, diarrhea, diaphoresis. Because of the above symptoms, the patient's family brought the patient back to the emergency department at [facility]. Evaluation in the ED revealed a chest x-ray that suggested a left medial base consolidation which was new. WBC was 8.1. BMP was unremarkable with a serum creatinine of 1.10. Hepatic enzymes were negative. EKG showed an unchanged RBBB. And hepatic enzymes were negative.  Consultants: None  Procedures: None  Antibiotics: None  Subjective: Patient feels better. Shortness of breath is mainly with exertion.Does have a cough occasionally. Has been feeling this way for few months. Feeling anxious as well. Her husband is  hospitalized at [facility] and she is very concerned.  Objective: Vital Signs  Filed Vitals:   07/21/13 0615 07/21/13 0635 07/21/13 0741 07/21/13 0810  BP: 110/60   97/44  Pulse: 74   80  Temp: 97.4 F (36.3 C)     TempSrc: Oral     Resp: 16     Height:      Weight:  76.1 kg (167 lb 12.3 oz)    SpO2: 100%  97% 98%    Intake/Output Summary (Last 24 hours) at 07/21/13 1024 Last data filed at 07/21/13 2130  Gross per 24 hour  Intake    410 ml  Output    700 ml  Net   -290 ml   Filed Weights   07/20/13 1305 07/20/13 1747 07/21/13 0635  Weight: 75.751 kg (167 lb) 75 kg (165 lb 5.5 oz) 76.1 kg (167 lb 12.3 oz)   General appearance: alert, cooperative, appears stated age and no distress Head: Normocephalic, without obvious abnormality, atraumatic Eyes: conjunctivae/corneas clear. PERRL, EOM's intact. Resp: Decreased air entry at bases with end exp wheezing bilaterally. No definite crackles. Cardio: regular rate and rhythm, S1, S2 normal, no murmur, click, rub or gallop GI: soft, non-tender; bowel sounds normal; no masses,  no organomegaly Extremities: extremities normal, atraumatic, no cyanosis or edema Pulses: 2+ and symmetric Skin: Skin color, texture, turgor normal. No rashes or lesions Lymph nodes: Cervical, supraclavicular, and axillary nodes normal. Neurologic: Alert, Anxious. No cranial nerve deficits. No focal deficits.  Lab Results:  Basic Metabolic Panel:  Recent Labs Lab 07/16/13 0557 07/18/13 0515 07/19/13 0425 07/20/13 1335 07/21/13 0200  NA 139 139 138 139 137  K 3.5 3.0* 4.2 4.1 3.6  CL 98 94* 99 101 97  CO2 31 32 29  27 28  GLUCOSE 96 109* 144* 96 123*  BUN 31* 47* 42* 34* 33*  CREATININE 0.83 1.29* 1.14* 1.10 1.16*  CALCIUM 8.8 10.0 9.2 9.2 8.6   Liver Function Tests:  Recent Labs Lab 07/19/13 0425 07/20/13 1335  AST 15 13  ALT 18 16  ALKPHOS 65 60  BILITOT 0.3 0.4  PROT 6.6 6.2  ALBUMIN 3.3* 3.3*   CBC:  Recent Labs Lab 07/20/13 1335  07/21/13 0200  WBC 8.1 9.4  NEUTROABS 4.5  --   HGB 11.5* 12.1  HCT 35.7* 36.4  MCV 89.0 87.1  PLT 180 180   Cardiac Enzymes:  Recent Labs Lab 07/14/13 1305 07/18/13 0800 07/20/13 1335 07/20/13 2017 07/21/13 0200  CKTOTAL  --   --   --  155  --   TROPONINI <0.30 <0.30 <0.30 <0.30 <0.30   BNP (last 3 results)  Recent Labs  07/12/13 1330 07/20/13 1335  PROBNP 214.8* 63.9   CBG: No results found for this basename: GLUCAP,  in the last 168 hours  Recent Results (from the past 240 hour(s))  CULTURE, BLOOD (ROUTINE X 2)     Status: None   Collection Time    07/20/13  3:20 PM      Result Value Range Status   Specimen Description BLOOD RIGHT WRIST   Final   Special Requests     Final   Value: Normal BOTTLES DRAWN AEROBIC AND ANAEROBIC 5CC EACH   Culture  Setup Time 07/20/2013 23:52   Final   Culture     Final   Value:        BLOOD CULTURE RECEIVED NO GROWTH TO DATE CULTURE WILL BE HELD FOR 5 DAYS BEFORE ISSUING A FINAL NEGATIVE REPORT   Report Status PENDING   Incomplete      Studies/Results: Dg Chest 2 View  07/20/2013   *RADIOLOGY REPORT*  Clinical Data: Shortness of breath  CHEST - 2 VIEW  Comparison: July 12, 2013  Findings: There is no pulmonary edema, or pleural effusion.  There are stable bilateral pleural calcifications. There is interval developed patchy consolidation of the medial left lung base.  The mediastinal contour and cardiac silhouette are normal.  The soft tissues and osseous structures are stable.  IMPRESSION: Interval developed patchy consolidation of the medial left lung base suspicious for pneumonia.  Stable bilateral calcified pleural plaques.   Original Report Authenticated By: Sherian Rein, M.D.    Medications:  Scheduled: . ipratropium  0.5 mg Nebulization Q6H   And  . albuterol  2.5 mg Nebulization Q6H  . ALPRAZolam  0.5 mg Oral QHS  . ALPRAZolam  1 mg Oral Custom  . aspirin EC  81 mg Oral Daily  . atorvastatin  20 mg Oral q1800  .  budesonide-formoterol  2 puff Inhalation BID  . clopidogrel  75 mg Oral Q breakfast  . enoxaparin (LOVENOX) injection  40 mg Subcutaneous Q24H  . guaiFENesin  600 mg Oral BID  . methylPREDNISolone (SOLU-MEDROL) injection  60 mg Intravenous Q8H  . Milnacipran  50 mg Oral BID  . [START ON 07/22/2013] nebivolol  5 mg Oral Daily  . pregabalin  300 mg Oral BID  . sodium chloride  3 mL Intravenous Q12H  . traZODone  50 mg Oral QHS   Continuous:  YNW:GNFAOZHYQMVHQ, acetaminophen, HYDROcodone-acetaminophen, nitroGLYCERIN  Assessment/Plan:  Active Problems:   HTN (hypertension), benign   Fibromyalgia syndrome   AKI (acute kidney injury)   Physical deconditioning   Dyspnea on exertion  Pulmonary infiltrate  Due to lack of symptoms and other data suggestive of infection she was not initiated on antibiotics. She was recently treated with Levaquin as well. Will continue to watch off antibiotics. Repeat CXR in AM.   Dyspnea and Chest Pain Symptoms mostly chronic. Will continue q6h nebs. Will initiate steroids at higher dose. Recent CTA was negative for PE. Will need Pulmonary input but can be pursued as OP. Troponin is normal. CP most likely driven by anxiety and/or fibromyalgia. Has had work up recently with stress test which was negative for ischemia. Change to selective BB due to wheezing.  Mild Acute kidney injury  Would not give the patient any furosemide or ARB at this time. Renal function is stable. Continue to monitor.   Hypotension Hold Antihypertensive meds for low BP. Monitor Bp closely.   History of CAD Has RCA disease based on a cath done earlier this year per cards. But stress test was negative for ischemia. Continue Plavix. BB as tolerated.  Fibromyalgia  -Continue Lyrica and Savella  -Continue trazodone and Xanax   Generalized weakness  Likely due to dyspnea. TSh was normal.  Chronic Back Pain Continue home meds.  Code Status:  Full Code DVT Prophylaxis:    Lovenox Family Communication: Discussed with patient and niece who was at bedside. Discussed with POA, Roshelle Traub (son) at [phone]. Disposition Plan: Await PT. Anticipate DC in 1-2 days. Will need Neb machine.    LOS: 1 day   [facility]  [facility] Pager [phone] 07/21/2013, 10:24 AM  If 8PM-8AM, please contact night-coverage at [email or web address], password [email or web address]   Was informed by Rn earlier that patient had a syncopal episode while working with PT. She had gotten out of bed and then she felt lightheaded and then passes out. She was not hypoxic. Her BP was normal. Orthostatics reveal no change in BP but HR went up slightly. She denies Cp. Shortness of breath is at baseline. Unclear what transpired. No arrythmias on Tele. EKG doesn't show changes. Will continue to monitor. May need to involve cardiology.  Subrena Devereux 6:28 PM

## 2013-07-21 NOTE — Progress Notes (Signed)
Pt c/o Chest tightness and anxiety. VSS. Scheduled Xanax given to pt. MD aware. No new orders received. Will continue to monitor pt closely.

## 2013-07-21 NOTE — Telephone Encounter (Signed)
I sent in form to [facility] along with the nebulizer medication to [facility].

## 2013-07-21 NOTE — Evaluation (Signed)
Physical Therapy Evaluation Patient Details Name: BRANDEN VINE MRN: [id number] DOB: [birth year] Today's Date: 07/21/2013 Time: 5784-6962 PT Time Calculation (min): 28 min  PT Assessment / Plan / Recommendation History of Present Illness  67 year old female with a history of asbestosis, hypertension, depression, fibromyalgia, hyperlipidemia, and diastolic CHF was discharged from the hospital on 07/19/2013. Her hospital stay was significant for respiratory failure which was thought to be multifactorial including COPD with a possible component of heart failure. The patient completed 5 days of Levaquin and was sent home with a prednisone taper. CT angiogram of the chest on 07/13/2013 was negative for pulmonary embolus but showed increased pulmonary vascularity and scattered groundglass opacities in the bilateral upper lung sounds. The patient also had a Myoview performed which was negative for any reversible ischemia. EF on the Myoview was 91%. Sonogram of the lower extremities was negative for DVT. The patient was doing well on the night after discharge. When she woke up this morning, the patient had generalized weakness and shortness of breath with exertion. She went to the bathroom and had difficulty getting up and needed assistance. There was no worsening fevers, chills, chest discomfort, headache, dizziness, syncope, nausea, vomiting, diarrhea, diaphoresis.  Clinical Impression  Attempted PT evaluation, assessment limited. During static standing activity patient became quiet, eyes rolled back in her head and patient became dead weight in my arms. PT tech assisted me in getting the patient to EOB then she went to seek out the nurse. Pt became responsive once seated and then again went unresponsive for a brief moment prior to nsg coming into room. Patient HR increased but returned to baseline. Initial BP assessment on dynamap error'd out, BP then assessed by nsg manually. Patient wanted to continue to  try ambulation, expressed to patient that will hold further ambulation at this time. Provided further words of encouragement to patient who was appreciative. PT will continue to see patient as indicated for further assessment of needs in preparation for dc home when medically appropriate. Rec continued HHPT as initially plan following first dc.    PT Assessment  Patient needs continued PT services    Follow Up Recommendations  Home health PT       Barriers to Discharge Other (comment) (may have difficulty getting help for home after d/c)      Equipment Recommendations  Rolling walker with 5" wheels    Recommendations for Other Services     Frequency Min 3X/week    Precautions / Restrictions Precautions Precautions: Fall Precaution Comments: check O2 Sats Restrictions Weight Bearing Restrictions: No   Pertinent Vitals/Pain No pain at this time, O2 on rm air at lowest 91% with HR 150s during unresponsiveness return to baseline of 96% and HR 90s.       Mobility  Bed Mobility Bed Mobility: Sit to Supine;Supine to Sit;Sitting - Scoot to Edge of Bed Supine to Sit: 5: Supervision;HOB flat Sitting - Scoot to Edge of Bed: 5: Supervision Sit to Supine: 5: Supervision Transfers Transfers: Sit to Stand;Stand to Sit Sit to Stand: 4: Min guard;From bed Stand to Sit: 1: +1 Total assist;To bed (patient unresponsive) Details for Transfer Assistance: Patient standing having O2 assessed and putting on house coat in preparation for ambulation, patient eyes rolled back and patient became dead weight and unresponsive. NSG notified immediately, HR elevated to 156 on pulse Ox with O2 at 92% rm air at time of no response. Ambulation/Gait Ambulation/Gait Assistance: Not tested (comment)        PT  Diagnosis: Difficulty walking;Generalized weakness  PT Problem List: Decreased strength;Decreased activity tolerance;Decreased mobility;Cardiopulmonary status limiting activity PT Treatment  Interventions: Gait training;DME instruction;Stair training;Therapeutic activities;Therapeutic exercise;Patient/family education     PT Goals(Current goals can be found in the care plan section) Acute Rehab PT Goals Patient Stated Goal: My husband is my primary focus, but I need to get home. PT Goal Formulation: With patient Time For Goal Achievement: 08/04/13 Potential to Achieve Goals: Good  Visit Information  Last PT Received On: 07/21/13 Assistance Needed: +1 History of Present Illness: 67 year old female with a history of asbestosis, hypertension, depression, fibromyalgia, hyperlipidemia, and diastolic CHF was discharged from the hospital on 07/19/2013. Her hospital stay was significant for respiratory failure which was thought to be multifactorial including COPD with a possible component of heart failure. The patient completed 5 days of Levaquin and was sent home with a prednisone taper. CT angiogram of the chest on 07/13/2013 was negative for pulmonary embolus but showed increased pulmonary vascularity and scattered groundglass opacities in the bilateral upper lung sounds. The patient also had a Myoview performed which was negative for any reversible ischemia. EF on the Myoview was 91%. Sonogram of the lower extremities was negative for DVT. The patient was doing well on the night after discharge. When she woke up this morning, the patient had generalized weakness and shortness of breath with exertion. She went to the bathroom and had difficulty getting up and needed assistance. There was no worsening fevers, chills, chest discomfort, headache, dizziness, syncope, nausea, vomiting, diarrhea, diaphoresis.       Prior Functioning  Home Living Family/patient expects to be discharged to:: Private residence Living Arrangements: Spouse/significant other Available Help at Discharge: Other (Comment) (TBA family is spread out caring for pt and her husband in [place]) Type of Home: House Home Access:  Stairs to enter [employer] of Steps: 3/8 Entrance Stairs-Rails: Right;Left Home Layout: One level Home Equipment: None Prior Function Level of Independence: Independent Communication Communication: No difficulties    Cognition  Cognition Arousal/Alertness: Awake/alert Behavior During Therapy: WFL for tasks assessed/performed Overall Cognitive Status: Within Functional Limits for tasks assessed    Extremity/Trunk Assessment Upper Extremity Assessment Upper Extremity Assessment: Overall WFL for tasks assessed Lower Extremity Assessment Lower Extremity Assessment: Overall WFL for tasks assessed   Balance Balance Balance Assessed: Yes Static Sitting Balance Static Sitting - Balance Support: Feet supported;No upper extremity supported Static Sitting - Level of Assistance: 7: Independent Static Sitting - Comment/# of Minutes: prior to standing  End of Session PT - End of Session Equipment Utilized During Treatment: Gait belt Activity Tolerance: Patient tolerated treatment well Patient left: in bed;with call bell/phone within reach Nurse Communication: Mobility status (Un responsive)  GP     Fabio Asa 07/21/2013, 2:29 PM Charlotte Crumb, PT DPT  [phone]

## 2013-07-21 NOTE — Progress Notes (Signed)
Orthostatic VS obtained per MD order for syncopal episode. Documented in computer. Will continue to monitor pt closely.

## 2013-07-21 NOTE — Progress Notes (Signed)
Called into room by PT because pt having syncopal episode. Per PT, pt attempted to stand, passed out and was unresponsive. PT assisted pt back in bed. Upon assessment of pt, pt was SOB and audible wheezes were present. Manual BP was taken. 142/72 was revealed. HR in 90's. Scheduled nebs and IV steroids given. MD notified. No new orders received. Will continue to monitor pt closely.

## 2013-07-21 NOTE — Progress Notes (Addendum)
Pt with c/o CP and chronic back and leg pain. Per pt, "My chest starts hurting when my back and legs hurt real bad. It just travels up." EKG and VSS obtained. EKG normal.  No NTG given due to low BP.  Pt does report anxiety. Scheduled Xanax and pain medicine given. MD notified. No new orders received. Will continue to monitor pt closely.

## 2013-07-22 ENCOUNTER — Ambulatory Visit: Payer: Medicare Other

## 2013-07-22 ENCOUNTER — Other Ambulatory Visit: Payer: Medicare Other | Admitting: Lab

## 2013-07-22 ENCOUNTER — Ambulatory Visit: Payer: Medicare Other | Admitting: Hematology & Oncology

## 2013-07-22 ENCOUNTER — Telehealth: Payer: Self-pay | Admitting: Hematology & Oncology

## 2013-07-22 DIAGNOSIS — J61 Pneumoconiosis due to asbestos and other mineral fibers: Secondary | ICD-10-CM

## 2013-07-22 DIAGNOSIS — R0902 Hypoxemia: Secondary | ICD-10-CM

## 2013-07-22 DIAGNOSIS — J189 Pneumonia, unspecified organism: Secondary | ICD-10-CM

## 2013-07-22 DIAGNOSIS — R55 Syncope and collapse: Secondary | ICD-10-CM

## 2013-07-22 DIAGNOSIS — I10 Essential (primary) hypertension: Secondary | ICD-10-CM

## 2013-07-22 DIAGNOSIS — J9600 Acute respiratory failure, unspecified whether with hypoxia or hypercapnia: Secondary | ICD-10-CM

## 2013-07-22 DIAGNOSIS — J920 Pleural plaque with presence of asbestos: Secondary | ICD-10-CM | POA: Insufficient documentation

## 2013-07-22 DIAGNOSIS — J45901 Unspecified asthma with (acute) exacerbation: Secondary | ICD-10-CM

## 2013-07-22 HISTORY — DX: Pleural plaque with presence of asbestos: J92.0

## 2013-07-22 LAB — CBC
Hemoglobin: 12 g/dL (ref 12.0–15.0)
Platelets: 198 10*3/uL (ref 150–400)
RBC: 4.19 MIL/uL (ref 3.87–5.11)
WBC: 15 10*3/uL — ABNORMAL HIGH (ref 4.0–10.5)

## 2013-07-22 LAB — TROPONIN I: Troponin I: <0.3 ng/mL (ref <0.30)

## 2013-07-22 LAB — BASIC METABOLIC PANEL
CO2: 25 mEq/L (ref 19–32)
Chloride: 100 mEq/L (ref 96–112)
Glucose, Bld: 186 mg/dL — ABNORMAL HIGH (ref 70–99)
Potassium: 4 mEq/L (ref 3.5–5.1)
Sodium: 137 mEq/L (ref 135–145)

## 2013-07-22 MED ORDER — ALBUTEROL SULFATE (5 MG/ML) 0.5% IN NEBU
2.5000 mg | INHALATION_SOLUTION | RESPIRATORY_TRACT | Status: DC | PRN
  Administered 2013-07-26: 2.5 mg via RESPIRATORY_TRACT
  Filled 2013-07-22: qty 0.5

## 2013-07-22 MED ORDER — ASPIRIN 81 MG PO CHEW
CHEWABLE_TABLET | ORAL | Status: AC
  Administered 2013-07-22: 81 mg
  Filled 2013-07-22: qty 1

## 2013-07-22 NOTE — Telephone Encounter (Signed)
RN aware pt cx 7-31 she is in the hospital, pt will call to schedule appointment when she gets out.

## 2013-07-22 NOTE — Progress Notes (Signed)
Physical Therapy Treatment Patient Details Name: Linda [name] MRN: [id number] DOB: 1946-11-30 Today's Date: 07/22/2013 Time:  -     PT Assessment / Plan / Recommendation  History of Present Illness 67 year old female with a history of asbestosis, hypertension, depression, fibromyalgia, hyperlipidemia, and diastolic CHF was discharged from the hospital on 07/19/2013. Her hospital stay was significant for respiratory failure which was thought to be multifactorial including COPD with a possible component of heart failure. The patient completed 5 days of Levaquin and was sent home with a prednisone taper. CT angiogram of the chest on 07/13/2013 was negative for pulmonary embolus but showed increased pulmonary vascularity and scattered groundglass opacities in the bilateral upper lung sounds. The patient also had a Myoview performed which was negative for any reversible ischemia. EF on the Myoview was 91%. Sonogram of the lower extremities was negative for DVT. The patient was doing well on the night after discharge. When she woke up this morning, the patient had generalized weakness and shortness of breath with exertion. She went to the bathroom and had difficulty getting up and needed assistance. There was no worsening fevers, chills, chest discomfort, headache, dizziness, syncope, nausea, vomiting, diarrhea, diaphoresis.   PT Comments   Pt demonstrates some modest instablility and guarded gait, SpO2 >95% on rm air, one noted desaturation after coughing during inital ambulation (dropped to 91% but quickly rebounded). Patient did report dizziness toward the end of our walk, BP assessed and elevated 160s/70s.  Orthostatics assess at start of session. Supine 145/62; seated 127/62; standing 146/63. Pt appears anxious during ambulation.    Follow Up Recommendations  Home health PT           Equipment Recommendations  Rolling walker with 5" wheels       Frequency Min 3X/week   Progress towards  PT Goals Progress towards PT goals: Progressing toward goals  Plan Current plan remains appropriate    Precautions / Restrictions Precautions Precautions: Fall Precaution Comments: check O2 Sats Restrictions Weight Bearing Restrictions: No   Pertinent Vitals/Pain No pain, see Vitals as indicated in PT comments above    Mobility  Bed Mobility Bed Mobility: Supine to Sit;Sitting - Scoot to Edge of Bed;Sit to Supine Supine to Sit: 6: Modified independent (Device/Increase time);HOB flat Sitting - Scoot to Edge of Bed: 6: Modified independent (Device/Increase time) Sit to Supine: 6: Modified independent (Device/Increase time);HOB flat Transfers Transfers: Sit to Stand;Stand to Sit Sit to Stand: 4: Min guard Stand to Sit: 5: Supervision;With upper extremity assist;To bed Ambulation/Gait Ambulation/Gait Assistance: 4: Min guard Ambulation Distance (Feet): 160 Feet Assistive device: Rolling walker Ambulation/Gait Assistance Details: VCs for upright posture and gait speed increase, patient with some instability. Gait Pattern: Step-through pattern;Decreased stride length;Shuffle Gait velocity: decreased General Gait Details: Pt mildly unsteady.  Very talkative & constantly looking into other pt's rooms Stairs: No Wheelchair Mobility Wheelchair Mobility: No    Exercises     PT Diagnosis:    PT Problem List:   PT Treatment Interventions:     PT Goals (current goals can now be found in the care plan section) Acute Rehab PT Goals Patient Stated Goal: My husband is my primary focus, but I need to get home. PT Goal Formulation: With patient Time For Goal Achievement: 07/29/13 Potential to Achieve Goals: Good  Visit Information  Last PT Received On: 07/22/13 Assistance Needed: +1 History of Present Illness: 67 year old female with a history of asbestosis, hypertension, depression, fibromyalgia, hyperlipidemia, and diastolic CHF was discharged from the  hospital on 07/19/2013. Her  hospital stay was significant for respiratory failure which was thought to be multifactorial including COPD with a possible component of heart failure. The patient completed 5 days of Levaquin and was sent home with a prednisone taper. CT angiogram of the chest on 07/13/2013 was negative for pulmonary embolus but showed increased pulmonary vascularity and scattered groundglass opacities in the bilateral upper lung sounds. The patient also had a Myoview performed which was negative for any reversible ischemia. EF on the Myoview was 91%. Sonogram of the lower extremities was negative for DVT. The patient was doing well on the night after discharge. When she woke up this morning, the patient had generalized weakness and shortness of breath with exertion. She went to the bathroom and had difficulty getting up and needed assistance. There was no worsening fevers, chills, chest discomfort, headache, dizziness, syncope, nausea, vomiting, diarrhea, diaphoresis.    Subjective Data  Subjective: I feel better Patient Stated Goal: My husband is my primary focus, but I need to get home.   Cognition  Cognition Arousal/Alertness: Awake/alert Behavior During Therapy: WFL for tasks assessed/performed Overall Cognitive Status: Within Functional Limits for tasks assessed    Balance  Balance Balance Assessed: Yes Static Standing Balance Static Standing - Balance Support: No upper extremity supported Static Standing - Level of Assistance: 5: Stand by assistance Static Standing - Comment/# of Minutes: 6 minutes during orthostatic assessments  End of Session PT - End of Session Equipment Utilized During Treatment: Gait belt Activity Tolerance: Patient tolerated treatment well Patient left: in chair;with call bell/phone within reach Nurse Communication: Mobility status   GP     Fabio Asa 07/22/2013, 2:36 PM Charlotte Crumb, PT DPT  [phone]

## 2013-07-22 NOTE — Care Management Note (Signed)
    Page 1 of 2   07/26/2013     5:14:05 PM   CARE MANAGEMENT NOTE 07/26/2013  Patient:  [name] A   Account Number:  [id number]  Date Initiated:  07/22/2013  Documentation initiated by:  Jaryan Chicoine  Subjective/Objective Assessment:   PT ADM ON 07/20/13 WITH SOB, ANXIETY, WHEEZING.  PTA, PT LIVES WITH HUSBAND, WHO IS CURRENTLY AT [facility] RECEIVING TREATMENT.     Action/Plan:   WILL FOLLOW FOR DISCHARGE NEEDS AS PT PROGRESSES.  PT ACTIVE WITH [facility] PRIOR TO ADMISSION FOR HH CARE.  WILL NEED RESUMPTION ORDERS PRIOR TO DC.   Anticipated DC Date:  07/24/2013   Anticipated DC Plan:  HOME W HOME HEALTH SERVICES      DC Planning Services  CM consult      [facility] Choice  Resumption Of Svcs/PTA Provider   Choice offered to / List presented to:  C-1 Patient        HH arranged  HH-1 RN  HH-2 PT  HH-3 OT      [facility] agency  [facility]   Status of service:  Completed, signed off Medicare Important Message given?   (If response is "NO", the following Medicare IM given date fields will be blank) Date Medicare IM given:   Date Additional Medicare IM given:    Discharge Disposition:  HOME W HOME HEALTH SERVICES  Per UR Regulation:  Reviewed for med. necessity/level of care/duration of stay  If discussed at Long Length of Stay Meetings, dates discussed:    Comments:  07/26/13 Siah Steely,RN,BSN [phone] PT FOR DC HOME TODAY WITH HH AS PRIOR TO ADMISSION. NOTIFIED [facility] OF DC TODAY.  START OF CARE 24-48H POST DC DATE.

## 2013-07-22 NOTE — Progress Notes (Signed)
[facility] PROGRESS NOTE  Linda [name] ZOX:[id number] DOB: 1946-03-25 DOA: 07/20/2013  PCP: Jeoffrey Massed, MD  Brief HPI: 67 year old female with a history of asbestosis, Asthma, hypertension, depression, fibromyalgia, hyperlipidemia, and diastolic CHF was discharged from the hospital on 07/19/2013. Her hospital stay was significant for respiratory failure which was thought to be multifactorial including COPD with a possible component of heart failure. The patient completed 5 days of Levaquin and was sent home with a prednisone taper. CT angiogram of the chest on 07/13/2013 was negative for pulmonary embolus but showed increased pulmonary vascularity and scattered groundglass opacities in the bilateral upper lung sounds. The patient also had a Myoview performed which was negative for any reversible ischemia. EF on the Myoview was 91%. Sonogram of the lower extremities was negative for DVT. The patient was doing well on the night after discharge. When she woke up the morning of admission, the patient had generalized weakness and shortness of breath with exertion. She went to the bathroom and had difficulty getting up and needed assistance. There was no worsening fevers, chills, chest discomfort, headache, dizziness, syncope, nausea, vomiting, diarrhea, diaphoresis. Because of the above symptoms, the patient's family brought the patient back to the emergency department at [facility]. Evaluation in the ED revealed a chest x-ray that suggested a left medial base consolidation which was new. WBC was 8.1. BMP was unremarkable with a serum creatinine of 1.10. Hepatic enzymes were negative. EKG showed an unchanged RBBB. And hepatic enzymes were negative.  Consultants: Pulmonology  Procedures: None  Antibiotics: None  Subjective: Patient continues to be short of breath. Not much improvement. No further syncopal episodes though she hasn't ambulated yet today. Son is at bedside.  Objective: Vital  Signs  Filed Vitals:   07/21/13 2040 07/22/13 0221 07/22/13 0357 07/22/13 0811  BP:   132/58   Pulse: 86 88 97   Temp:   98.2 F (36.8 C)   TempSrc:   Oral   Resp: 18 16 18    Height:      Weight:   76.6 kg (168 lb 14 oz)   SpO2:   94% 96%    Intake/Output Summary (Last 24 hours) at 07/22/13 1114 Last data filed at 07/22/13 0006  Gross per 24 hour  Intake    360 ml  Output   1600 ml  Net  -1240 ml   Filed Weights   07/20/13 1747 07/21/13 0635 07/22/13 0357  Weight: 75 kg (165 lb 5.5 oz) 76.1 kg (167 lb 12.3 oz) 76.6 kg (168 lb 14 oz)   General appearance: alert, cooperative, appears stated age and no distress Resp: Decreased air entry at bases with end exp wheezing bilaterally. Few crackles at bases .  Cardio: regular rate and rhythm, S1, S2 normal, no murmur, click, rub or gallop GI: soft, non-tender; bowel sounds normal; no masses,  no organomegaly Extremities: extremities normal, atraumatic, no cyanosis or edema Pulses: 2+ and symmetric Skin: Skin color, texture, turgor normal. No rashes or lesions Lymph nodes: Cervical, supraclavicular, and axillary nodes normal. Neurologic: Alert, Anxious. No cranial nerve deficits. No focal deficits.  Lab Results:  Basic Metabolic Panel:  Recent Labs Lab 07/18/13 0515 07/19/13 0425 07/20/13 1335 07/21/13 0200 07/22/13 0540  NA 139 138 139 137 137  K 3.0* 4.2 4.1 3.6 4.0  CL 94* 99 101 97 100  CO2 32 29 27 28 25   GLUCOSE 109* 144* 96 123* 186*  BUN 47* 42* 34* 33* 22  CREATININE 1.29* 1.14* 1.10 1.16*  0.75  CALCIUM 10.0 9.2 9.2 8.6 9.0   Liver Function Tests:  Recent Labs Lab 07/19/13 0425 07/20/13 1335  AST 15 13  ALT 18 16  ALKPHOS 65 60  BILITOT 0.3 0.4  PROT 6.6 6.2  ALBUMIN 3.3* 3.3*   CBC:  Recent Labs Lab 07/20/13 1335 07/21/13 0200 07/22/13 0540  WBC 8.1 9.4 15.0*  NEUTROABS 4.5  --   --   HGB 11.5* 12.1 12.0  HCT 35.7* 36.4 35.8*  MCV 89.0 87.1 85.4  PLT 180 180 198   Cardiac  Enzymes:  Recent Labs Lab 07/18/13 0800 07/20/13 1335 07/20/13 2017 07/21/13 0200 07/22/13 0510  CKTOTAL  --   --  155  --   --   TROPONINI <0.30 <0.30 <0.30 <0.30 <0.30   BNP (last 3 results)  Recent Labs  07/12/13 1330 07/20/13 1335  PROBNP 214.8* 63.9   CBG: No results found for this basename: GLUCAP,  in the last 168 hours  Recent Results (from the past 240 hour(s))  CULTURE, BLOOD (ROUTINE X 2)     Status: None   Collection Time    07/20/13  3:20 PM      Result Value Range Status   Specimen Description BLOOD RIGHT WRIST   Final   Special Requests     Final   Value: Normal BOTTLES DRAWN AEROBIC AND ANAEROBIC 5CC EACH   Culture  Setup Time 07/20/2013 23:52   Final   Culture     Final   Value:        BLOOD CULTURE RECEIVED NO GROWTH TO DATE CULTURE WILL BE HELD FOR 5 DAYS BEFORE ISSUING A FINAL NEGATIVE REPORT   Report Status PENDING   Incomplete      Studies/Results: Dg Chest 2 View  07/20/2013   *RADIOLOGY REPORT*  Clinical Data: Shortness of breath  CHEST - 2 VIEW  Comparison: July 12, 2013  Findings: There is no pulmonary edema, or pleural effusion.  There are stable bilateral pleural calcifications. There is interval developed patchy consolidation of the medial left lung base.  The mediastinal contour and cardiac silhouette are normal.  The soft tissues and osseous structures are stable.  IMPRESSION: Interval developed patchy consolidation of the medial left lung base suspicious for pneumonia.  Stable bilateral calcified pleural plaques.   Original Report Authenticated By: Sherian Rein, M.D.    Medications:  Scheduled: . ipratropium  0.5 mg Nebulization Q6H   And  . albuterol  2.5 mg Nebulization Q6H  . ALPRAZolam  0.5 mg Oral QHS  . ALPRAZolam  1 mg Oral Custom  . aspirin EC  81 mg Oral Daily  . atorvastatin  20 mg Oral q1800  . budesonide-formoterol  2 puff Inhalation BID  . clopidogrel  75 mg Oral Q breakfast  . enoxaparin (LOVENOX) injection  40 mg  Subcutaneous Q24H  . guaiFENesin  600 mg Oral BID  . methylPREDNISolone (SOLU-MEDROL) injection  60 mg Intravenous Q8H  . Milnacipran  50 mg Oral BID  . pregabalin  300 mg Oral BID  . sodium chloride  3 mL Intravenous Q12H  . traZODone  50 mg Oral QHS   Continuous:  NWG:NFAOZHYQMVHQI, acetaminophen, HYDROcodone-acetaminophen, nitroGLYCERIN, ondansetron (ZOFRAN) IV  Assessment/Plan:  Active Problems:   HTN (hypertension), benign   Fibromyalgia syndrome   AKI (acute kidney injury)   Physical deconditioning   Dyspnea on exertion    Pulmonary infiltrate  Due to lack of symptoms and other data suggestive of infection she was not initiated  on antibiotics. She was recently treated with Levaquin as well. Will continue to watch off antibiotics. Repeat CXR in AM.   Dyspnea and Chest Pain Symptoms mostly chronic but patient not a great historian at times. She is still wheezing and Son is very concerned. Will go ahead and consult Pulmonology. Will continue q6h nebs and IV steroids. Recent CTA was negative for PE. Troponin is normal. CP most likely driven by anxiety and/or fibromyalgia. Has had work up recently with stress test which was negative for ischemia. Change to selective BB due to wheezing if BP stabilizes.  Syncope on 7/30 Unclear what happened. Could have been induced by respiratory difficulties or could have dropped BP. Orthostatics checked later did not show drop in BP. Tele doesn't show any arrythmias. Continue to monitor.  Mild Acute kidney injury  Would not give the patient any furosemide or ARB at this time. Renal function is stable. Continue to monitor.   Hypotension BP is better. Not orthostatic 7/30. Holding Antihypertensive meds for low BP. Monitor Bp closely.   History of CAD Has RCA disease based on a cath done earlier this year per cards. But stress test was negative for ischemia. Continue Plavix. BB as tolerated.  Fibromyalgia  -Continue Lyrica and Savella   -Continue trazodone and Xanax   Generalized weakness  Likely due to dyspnea. TSh was normal.  Chronic Back Pain Continue home meds.  Code Status:  Full Code DVT Prophylaxis:   Lovenox Family Communication: Discussed with patient and Son who was at bedside. POA, Trenia Tennyson (son) [phone]. Disposition Plan: Await pulmonology input.     LOS: 2 days   [facility]  [facility] Pager [phone] 07/22/2013, 11:14 AM  If 8PM-8AM, please contact night-coverage at [email or web address], password [email or web address]

## 2013-07-22 NOTE — Consult Note (Signed)
PULMONARY  / CRITICAL CARE MEDICINE  Name: Linda [name] MRN: [id number] DOB: 1946-02-07    ADMISSION DATE:  07/20/2013 CONSULTATION DATE:  07/22/2013  REFERRING MD :  Dr. Rito Ehrlich PRIMARY SERVICE:  [facility]  CHIEF COMPLAINT:  SOB  BRIEF PATIENT DESCRIPTION: 67 year old female with PMH of asbestos exposure, asthma admitted 7/29 with SOB.    SIGNIFICANT EVENTS / STUDIES:  PFT's 7/31>>>  LINES / TUBES: none  CULTURES: BCx2 7/29>>>  ANTIBIOTICS: none  HISTORY OF PRESENT ILLNESS:  67yo female with hx asthma, OSA (non compliant with CPAP) asbestosis (dx in [place]), given symbicort/albuterol.  Presented 7/29 with increased cough/SOB x 1 week.  Asbestos exposure from her dad and husband who worked in ship yards.  Her husband is currently [facility] at [facility] with mesothelioma.  Chronic dry cough, no change now.  SOB is with exertion only. Denies chest pain, purulent sputum, fevers, chills.    PAST MEDICAL HISTORY :  Asthma [name]  Anemia  Fibromyalgia  CHF  Arthritis  CAD   Past Surgical History  Procedure Laterality Date  . Appendectomy  1960  . Total abdominal hysterectomy  1974  . Tendon release  1996    Right forearm and hand  . Knee surgery  2005  . Heel spur surgery Left 2008  . Plantar fascia release Left 2008  . Axillary surgery Left 1978    Multiple "lump" in armpit per pt  . Coccyx removal  1972  . Cardiac catheterization  01/2013    nonobstructive CAD, EF 55-60%  . Transthoracic echocardiogram  01/2013    NORMAL  . Dilation and curettage of uterus  ? 1970's  . Eye surgery Left 2012-2013    "injections for ~ 1 yr; don't really know what for" (07/12/2013)     Prior to Admission medications   Medication Sig Start Date End Date Taking? Authorizing Provider  albuterol (PROVENTIL HFA;VENTOLIN HFA) 108 (90 BASE) MCG/ACT inhaler Inhale 2 puffs into the lungs every 6 (six) hours as needed for wheezing. For shortness of breath 07/19/13  Yes Richarda Overlie, MD  ALPRAZolam  Prudy Feeler) 1 MG tablet Take 0.5-1 mg by mouth 3 (three) times daily. 1mg  in a.m. And noon and 0.5 mg in pm   Yes Historical Provider, MD  amitriptyline (ELAVIL) 100 MG tablet Take 50 mg by mouth at bedtime. 04/22/13  Yes Jeoffrey Massed, MD  aspirin EC 81 MG EC tablet Take 1 tablet (81 mg total) by mouth daily. 01/31/13  Yes Robynn Pane, MD  atorvastatin (LIPITOR) 20 MG tablet Take 1 tablet (20 mg total) by mouth daily at 6 PM. 06/28/13  Yes Jeoffrey Massed, MD  budesonide-formoterol (SYMBICORT) 160-4.5 MCG/ACT inhaler Inhale 2 puffs into the lungs 2 (two) times daily. 07/19/13  Yes Richarda Overlie, MD  clopidogrel (PLAVIX) 75 MG tablet Take 1 tablet (75 mg total) by mouth daily with breakfast. 01/31/13  Yes Robynn Pane, MD  guaiFENesin (MUCINEX) 600 MG 12 hr tablet Take 1 tablet (600 mg total) by mouth 2 (two) times daily. 07/19/13  Yes Richarda Overlie, MD  meloxicam (MOBIC) 7.5 MG tablet Take 7.5 mg by mouth 2 (two) times daily.   Yes Historical Provider, MD  metoprolol succinate (TOPROL-XL) 25 MG 24 hr tablet Take 1 tablet (25 mg total) by mouth daily. Take with or immediately following a meal. 07/19/13  Yes Richarda Overlie, MD  Milnacipran (SAVELLA) 50 MG TABS Take 1 tablet (50 mg total) by mouth 2 (two) times daily. 03/25/13  Yes Jeoffrey Massed, MD  Multiple Vitamins-Minerals (CENTRUM SILVER ADULT 50+ PO) Take 1 tablet by mouth daily.   Yes Historical Provider, MD  nitroGLYCERIN (NITROSTAT) 0.4 MG SL tablet Place 1 tablet (0.4 mg total) under the tongue every 5 (five) minutes x 3 doses as needed for chest pain. 07/19/13  Yes Richarda Overlie, MD  predniSONE (DELTASONE) 20 MG tablet Take 2 tablets (40 mg total) by mouth daily with breakfast. 07/19/13 07/22/13 Yes Richarda Overlie, MD  pregabalin (LYRICA) 300 MG capsule Take 1 capsule (300 mg total) by mouth 2 (two) times daily. 03/10/13  Yes Jeoffrey Massed, MD  traZODone (DESYREL) 50 MG tablet Take 50-100 mg by mouth at bedtime. Pt can take up to 2 tab for insomnia.    Yes Historical Provider, MD   Allergies  Allergen Reactions  . Penicillins Itching, Swelling and Rash    FAMILY HISTORY:  Family History  Problem Relation Age of Onset  . Arthritis Mother   . Kidney disease Mother   . Heart disease Father   . Stroke Father   . Hypertension Father   . Diabetes Father    SOCIAL HISTORY:  reports that she has never smoked. She has never used smokeless tobacco. She reports that she does not drink alcohol or use illicit drugs.  REVIEW OF SYSTEMS:   As per HPI - all other systems reviewed and were neg.    VITAL SIGNS: Temp:  [97.9 F (36.6 C)-98.6 F (37 C)] 98.2 F (36.8 C) (07/31 0357) Pulse Rate:  [86-101] 97 (07/31 0357) Resp:  [16-18] 18 (07/31 0357) BP: (94-132)/(56-71) 132/58 mmHg (07/31 0357) SpO2:  [94 %-100 %] 96 % (07/31 0811) Weight:  [76.6 kg (168 lb 14 oz)] 76.6 kg (168 lb 14 oz) (07/31 0357)  PHYSICAL EXAMINATION: General:  Obese female, NAD  Neuro:  Awake, alert, appropriate, MAE HEENT:  Mm moist, no JVD Cardiovascular:  s1s2 rrr, no m/r/g Lungs:  resps even non labored, decreased bs, clear  Abdomen:  Soft, +bs Musculoskeletal:  Warm and dry, no edema    Recent Labs Lab 07/20/13 1335 07/21/13 0200 07/22/13 0540  NA 139 137 137  K 4.1 3.6 4.0  CL 101 97 100  CO2 27 28 25   BUN 34* 33* 22  CREATININE 1.10 1.16* 0.75  GLUCOSE 96 123* 186*    Recent Labs Lab 07/20/13 1335 07/21/13 0200 07/22/13 0540  HGB 11.5* 12.1 12.0  HCT 35.7* 36.4 35.8*  WBC 8.1 9.4 15.0*  PLT 180 180 198   Dg Chest 2 View  07/20/2013   *RADIOLOGY REPORT*  Clinical Data: Shortness of breath  CHEST - 2 VIEW  Comparison: July 12, 2013  Findings: There is no pulmonary edema, or pleural effusion.  There are stable bilateral pleural calcifications. There is interval developed patchy consolidation of the medial left lung base.  The mediastinal contour and cardiac silhouette are normal.  The soft tissues and osseous structures are stable.   IMPRESSION: Interval developed patchy consolidation of the medial left lung base suspicious for pneumonia.  Stable bilateral calcified pleural plaques.   Original Report Authenticated By: Sherian Rein, M.D.    ASSESSMENT / PLAN:  Dyspnea - in setting underlying asthma and asbestosis.    PLAN -    Full PFT's. Cont symbicort/albuterol. Outpt pulm f/u - 9/26 with Dr. Craige Cotta. Intermittent f/u CXR. Pulmonary rehab.  Danford Bad, NP 07/22/2013  1:47 PM Pager: [phone] or [phone]  *Care during the described time interval was provided  by me and/or other providers on the critical care team. I have reviewed this patient's available data, including medical history, events of note, physical examination and test results as part of my evaluation.   Patient seen and examined, agree with above note.  I dictated the care and orders written for this patient under my direction.  Alyson Reedy, MD [phone]

## 2013-07-23 ENCOUNTER — Inpatient Hospital Stay (HOSPITAL_COMMUNITY): Payer: Medicare Other

## 2013-07-23 ENCOUNTER — Ambulatory Visit: Payer: Medicare Other | Admitting: Family Medicine

## 2013-07-23 DIAGNOSIS — F411 Generalized anxiety disorder: Secondary | ICD-10-CM

## 2013-07-23 DIAGNOSIS — I2699 Other pulmonary embolism without acute cor pulmonale: Secondary | ICD-10-CM

## 2013-07-23 HISTORY — DX: Other pulmonary embolism without acute cor pulmonale: I26.99

## 2013-07-23 MED ORDER — METHYLPREDNISOLONE SODIUM SUCC 40 MG IJ SOLR
40.0000 mg | Freq: Two times a day (BID) | INTRAMUSCULAR | Status: AC
  Administered 2013-07-24 – 2013-07-25 (×3): 40 mg via INTRAVENOUS
  Filled 2013-07-23 (×3): qty 1

## 2013-07-23 NOTE — Progress Notes (Signed)
Physical Therapy Treatment Patient Details Name: Linda [name] MRN: [id number] DOB: February 03, 1946 Today's Date: 07/23/2013 Time: 8295-6213 PT Time Calculation (min): 18 min  PT Assessment / Plan / Recommendation  History of Present Illness 67 year old female with a history of asbestosis, hypertension, depression, fibromyalgia, hyperlipidemia, and diastolic CHF was discharged from the hospital on 07/19/2013. Her hospital stay was significant for respiratory failure which was thought to be multifactorial including COPD with a possible component of heart failure. The patient completed 5 days of Levaquin and was sent home with a prednisone taper. CT angiogram of the chest on 07/13/2013 was negative for pulmonary embolus but showed increased pulmonary vascularity and scattered groundglass opacities in the bilateral upper lung sounds. The patient also had a Myoview performed which was negative for any reversible ischemia. EF on the Myoview was 91%. Sonogram of the lower extremities was negative for DVT. The patient was doing well on the night after discharge. When she woke up this morning, the patient had generalized weakness and shortness of breath with exertion. She went to the bathroom and had difficulty getting up and needed assistance. There was no worsening fevers, chills, chest discomfort, headache, dizziness, syncope, nausea, vomiting, diarrhea, diaphoresis.   PT Comments   Pt demonstrates significant improvements in functional mobility. Pt does not required use of rw for short distances. SpO2 remained >96% throughout. No physical assist needed.   Follow Up Recommendations  Home health PT           Equipment Recommendations  Rolling walker with 5" wheels       Frequency Min 3X/week   Progress towards PT Goals Progress towards PT goals: Progressing toward goals  Plan Current plan remains appropriate    Precautions / Restrictions Precautions Precautions: Fall Precaution Comments: check  O2 Sats Restrictions Weight Bearing Restrictions: No   Pertinent Vitals/Pain No pain at this time    Mobility  Bed Mobility Bed Mobility: Supine to Sit;Sitting - Scoot to Edge of Bed;Sit to Supine Supine to Sit: 6: Modified independent (Device/Increase time);HOB flat Sitting - Scoot to Edge of Bed: 6: Modified independent (Device/Increase time) Sit to Supine: 6: Modified independent (Device/Increase time);HOB flat Transfers Transfers: Sit to Stand;Stand to Sit Sit to Stand: 6: Modified independent (Device/Increase time) Stand to Sit: 6: Modified independent (Device/Increase time) Ambulation/Gait Ambulation/Gait Assistance: 7: Independent Ambulation Distance (Feet): 350 Feet Assistive device: None Ambulation/Gait Assistance Details: Cues for encouragement  Gait Pattern: Step-through pattern;Decreased stride length;Shuffle Gait velocity: improved cadence General Gait Details: patient able to ambulate without assistive device Stairs: No Wheelchair Mobility Wheelchair Mobility: No    Exercises     PT Diagnosis:    PT Problem List:   PT Treatment Interventions:     PT Goals (current goals can now be found in the care plan section) Acute Rehab PT Goals Patient Stated Goal: My husband is my primary focus, but I need to get home. PT Goal Formulation: With patient Time For Goal Achievement: 07/29/13 Potential to Achieve Goals: Good  Visit Information  Last PT Received On: 07/23/13 Assistance Needed: +1 History of Present Illness: 67 year old female with a history of asbestosis, hypertension, depression, fibromyalgia, hyperlipidemia, and diastolic CHF was discharged from the hospital on 07/19/2013. Her hospital stay was significant for respiratory failure which was thought to be multifactorial including COPD with a possible component of heart failure. The patient completed 5 days of Levaquin and was sent home with a prednisone taper. CT angiogram of the chest on 07/13/2013 was  negative for pulmonary  embolus but showed increased pulmonary vascularity and scattered groundglass opacities in the bilateral upper lung sounds. The patient also had a Myoview performed which was negative for any reversible ischemia. EF on the Myoview was 91%. Sonogram of the lower extremities was negative for DVT. The patient was doing well on the night after discharge. When she woke up this morning, the patient had generalized weakness and shortness of breath with exertion. She went to the bathroom and had difficulty getting up and needed assistance. There was no worsening fevers, chills, chest discomfort, headache, dizziness, syncope, nausea, vomiting, diarrhea, diaphoresis.    Subjective Data  Subjective: It was a rough morning, my lung test were not good Patient Stated Goal: My husband is my primary focus, but I need to get home.   Cognition  Cognition Arousal/Alertness: Awake/alert Behavior During Therapy: WFL for tasks assessed/performed Overall Cognitive Status: Within Functional Limits for tasks assessed    Balance  Balance Balance Assessed: Yes Static Standing Balance Static Standing - Balance Support: No upper extremity supported Static Standing - Level of Assistance: 7: Independent  End of Session PT - End of Session Equipment Utilized During Treatment: Gait belt Activity Tolerance: Patient tolerated treatment well Patient left: in chair;with call bell/phone within reach Nurse Communication: Mobility status   GP     Fabio Asa 07/23/2013, 1:26 PM Charlotte Crumb, PT DPT  [phone]

## 2013-07-23 NOTE — Progress Notes (Signed)
[facility] PROGRESS NOTE  Linda [name] XBJ:[id number] DOB: [birth year] DOA: 07/20/2013  PCP: Jeoffrey Massed, MD  Brief HPI: 67 year old female with a history of asbestosis, Asthma, hypertension, depression, fibromyalgia, hyperlipidemia, and diastolic CHF was discharged from the hospital on 07/19/2013. Her hospital stay was significant for respiratory failure which was thought to be multifactorial including COPD with a possible component of heart failure. The patient completed 5 days of Levaquin and was sent home with a prednisone taper. CT angiogram of the chest on 07/13/2013 was negative for pulmonary embolus but showed increased pulmonary vascularity and scattered groundglass opacities in the bilateral upper lung sounds. The patient also had a Myoview performed which was negative for any reversible ischemia. EF on the Myoview was 91%. Sonogram of the lower extremities was negative for DVT. The patient was doing well on the night after discharge. When she woke up the morning of admission, the patient had generalized weakness and shortness of breath with exertion. She went to the bathroom and had difficulty getting up and needed assistance. There was no worsening fevers, chills, chest discomfort, headache, dizziness, syncope, nausea, vomiting, diarrhea, diaphoresis. Because of the above symptoms, the patient's family brought the patient back to the emergency department at [facility]. Evaluation in the ED revealed a chest x-ray that suggested a left medial base consolidation which was new. WBC was 8.1. BMP was unremarkable with a serum creatinine of 1.10. Hepatic enzymes were negative. EKG showed an unchanged RBBB. And hepatic enzymes were negative.  Consultants: Pulmonology  Procedures: None  Antibiotics: None  Subjective: Patient did ambulate this morning. Still anxious and short of breath. Apparently had more wheezing overnight.   Objective: Vital Signs  Filed Vitals:   07/22/13 1956  07/22/13 2125 07/23/13 0404 07/23/13 0910  BP: 151/65  146/70   Pulse: 95  93   Temp: 98.4 F (36.9 C)  97.6 F (36.4 C)   TempSrc: Oral  Oral   Resp: 18  18   Height:      Weight:   77.2 kg (170 lb 3.1 oz)   SpO2: 94% 95% 96% 96%    Intake/Output Summary (Last 24 hours) at 07/23/13 1239 Last data filed at 07/22/13 2324  Gross per 24 hour  Intake    240 ml  Output    300 ml  Net    -60 ml   Filed Weights   07/21/13 0635 07/22/13 0357 07/23/13 0404  Weight: 76.1 kg (167 lb 12.3 oz) 76.6 kg (168 lb 14 oz) 77.2 kg (170 lb 3.1 oz)   General appearance: alert, cooperative, appears stated age and no distress Resp: More end exp wheezing heard today with crackles at right base.  Cardio: regular rate and rhythm, S1, S2 normal, no murmur, click, rub or gallop GI: soft, non-tender; bowel sounds normal; no masses,  no organomegaly Extremities: extremities normal, atraumatic, no cyanosis or edema Pulses: 2+ and symmetric Skin: Skin color, texture, turgor normal. No rashes or lesions Lymph nodes: Cervical, supraclavicular, and axillary nodes normal. Neurologic: Alert, Anxious. No cranial nerve deficits. No focal deficits.  Lab Results:  Basic Metabolic Panel:  Recent Labs Lab 07/18/13 0515 07/19/13 0425 07/20/13 1335 07/21/13 0200 07/22/13 0540  NA 139 138 139 137 137  K 3.0* 4.2 4.1 3.6 4.0  CL 94* 99 101 97 100  CO2 32 29 27 28 25   GLUCOSE 109* 144* 96 123* 186*  BUN 47* 42* 34* 33* 22  CREATININE 1.29* 1.14* 1.10 1.16* 0.75  CALCIUM 10.0 9.2  9.2 8.6 9.0   Liver Function Tests:  Recent Labs Lab 07/19/13 0425 07/20/13 1335  AST 15 13  ALT 18 16  ALKPHOS 65 60  BILITOT 0.3 0.4  PROT 6.6 6.2  ALBUMIN 3.3* 3.3*   CBC:  Recent Labs Lab 07/20/13 1335 07/21/13 0200 07/22/13 0540  WBC 8.1 9.4 15.0*  NEUTROABS 4.5  --   --   HGB 11.5* 12.1 12.0  HCT 35.7* 36.4 35.8*  MCV 89.0 87.1 85.4  PLT 180 180 198   Cardiac Enzymes:  Recent Labs Lab 07/18/13 0800  07/20/13 1335 07/20/13 2017 07/21/13 0200 07/22/13 0510  CKTOTAL  --   --  155  --   --   TROPONINI <0.30 <0.30 <0.30 <0.30 <0.30   BNP (last 3 results)  Recent Labs  07/12/13 1330 07/20/13 1335  PROBNP 214.8* 63.9   CBG: No results found for this basename: GLUCAP,  in the last 168 hours  Recent Results (from the past 240 hour(s))  CULTURE, BLOOD (ROUTINE X 2)     Status: None   Collection Time    07/20/13  3:20 PM      Result Value Range Status   Specimen Description BLOOD RIGHT WRIST   Final   Special Requests     Final   Value: Normal BOTTLES DRAWN AEROBIC AND ANAEROBIC 5CC EACH   Culture  Setup Time 07/20/2013 23:52   Final   Culture     Final   Value:        BLOOD CULTURE RECEIVED NO GROWTH TO DATE CULTURE WILL BE HELD FOR 5 DAYS BEFORE ISSUING A FINAL NEGATIVE REPORT   Report Status PENDING   Incomplete      Studies/Results: No results found.  Medications:  Scheduled: . ipratropium  0.5 mg Nebulization Q6H   And  . albuterol  2.5 mg Nebulization Q6H  . ALPRAZolam  0.5 mg Oral QHS  . ALPRAZolam  1 mg Oral Custom  . aspirin EC  81 mg Oral Daily  . atorvastatin  20 mg Oral q1800  . budesonide-formoterol  2 puff Inhalation BID  . clopidogrel  75 mg Oral Q breakfast  . enoxaparin (LOVENOX) injection  40 mg Subcutaneous Q24H  . guaiFENesin  600 mg Oral BID  . methylPREDNISolone (SOLU-MEDROL) injection  60 mg Intravenous Q8H  . Milnacipran  50 mg Oral BID  . pregabalin  300 mg Oral BID  . sodium chloride  3 mL Intravenous Q12H  . traZODone  50 mg Oral QHS   Continuous:  ZHY:[id number], acetaminophen, albuterol, HYDROcodone-acetaminophen, nitroGLYCERIN, ondansetron (ZOFRAN) IV  Assessment/Plan:  Active Problems:   HTN (hypertension), benign   Fibromyalgia syndrome   AKI (acute kidney injury)   Physical deconditioning   Dyspnea on exertion   [name]   Hypoxemia    Pulmonary infiltrate  Due to lack of symptoms and other data suggestive of  infection she was not initiated on antibiotics. She was recently treated with Levaquin as well. Will continue to watch off antibiotics. Since she reports more wheezing, will repeat CXR today.  Dyspnea and Chest Pain Symptoms mostly chronic but patient not a great historian at times. She is still wheezing and Son is very concerned. Appreciate input from Pulmonology. PFT's ordered. Will continue q6h nebs and IV steroids. Recent CTA was negative for PE. Troponin is normal. CP most likely driven by anxiety and/or fibromyalgia. Has had work up recently with stress test which was negative for ischemia. Change to selective BB due to wheezing  if BP stabilizes.  Syncope on 7/30 Unclear what happened. Could have been induced by respiratory difficulties or could have dropped BP. Orthostatics checked later did not show drop in BP. Tele doesn't show any arrythmias. Continue to monitor.  Mild Acute kidney injury  Would not give the patient any furosemide or ARB at this time. Renal function is stable. Continue to monitor.   Hypotension BP is better. Not orthostatic 7/30. Holding Antihypertensive meds for low BP. Monitor Bp closely.   History of CAD Has RCA disease based on a cath done earlier this year per cards. But stress test was negative for ischemia. Continue Plavix. BB as tolerated.  Fibromyalgia  -Continue Lyrica and Savella   Anxiety Disorder On Xanax. Cannot add lexapro as patient is already on savella and can cause significant side effects.  Generalized weakness  Likely due to dyspnea. TSh was normal.  Chronic Back Pain Continue home meds.  Code Status:  Full Code DVT Prophylaxis:   Lovenox Family Communication: Discussed with patient. POA, Linda [name] (son) [phone]. Disposition Plan: Needs home health on discharge. CXR today.     LOS: 3 days   [facility]  [facility] Pager [phone] 07/23/2013, 12:39 PM  If 8PM-8AM, please contact night-coverage at [email or web address],  password [facility]

## 2013-07-23 NOTE — Progress Notes (Signed)
PULMONARY  / CRITICAL CARE MEDICINE  Name: Linda [name] MRN: [id number] DOB: 1946-07-12    ADMISSION DATE:  07/20/2013 CONSULTATION DATE:  07/22/2013  REFERRING MD :  Dr. Rito Ehrlich PRIMARY SERVICE:  [facility]  CHIEF COMPLAINT:  SOB  BRIEF PATIENT DESCRIPTION:  67 yo female with progressive dyspnea with hx of asbestos related pleural plaques and asthma.  SIGNIFICANT EVENTS:  STUDIES:  Echo 01/30/13 >> EF 55 to 60% CT chest 03/16/13 >> calcified pleural plaques b/l, no other acute lung findings CT chest 07/13/13 >> mild GGO b/l upper lobes, b/l calcified pleural plaques, coronary calcification B/L lower extremity doppler 07/14/13 >> no DVT Spirometry 07/23/13 >> poor effort, suggestive of restrictive defect, no BD response  CULTURES: Blood 7/29>>>  ANTIBIOTICS: none  SUBJECTIVE: Still gets winded with activity.  Has intermittent wheeze.  Denies chest pain.  Feels better compared to admission, but not back to baseline.  VITAL SIGNS: Temp:  [97.6 F (36.4 C)-98.4 F (36.9 C)] 97.6 F (36.4 C) (08/01 0404) Pulse Rate:  [93-95] 93 (08/01 0404) Resp:  [18] 18 (08/01 0404) BP: (146-151)/(65-70) 146/70 mmHg (08/01 0404) SpO2:  [94 %-96 %] 96 % (08/01 1442) Weight:  [170 lb 3.1 oz (77.2 kg)] 170 lb 3.1 oz (77.2 kg) (08/01 0404) 2 liters [place]  PHYSICAL EXAMINATION: General: no distress Neuro: Alert, normal strength HEENT: no sinus tenderness Cardiovascular: regular, no murmur Lungs: b/l rhonchi, no wheeze  Abdomen: soft, non tender Musculoskeletal: no edema  CBC Recent Labs     07/21/13  0200  07/22/13  0540  WBC  9.4  15.0*  HGB  12.1  12.0  HCT  36.4  35.8*  PLT  180  198    BMET Recent Labs     07/21/13  0200  07/22/13  0540  NA  137  137  K  3.6  4.0  CL  97  100  CO2  28  25  BUN  33*  22  CREATININE  1.16*  0.75  GLUCOSE  123*  186*    Electrolytes Recent Labs     07/21/13  0200  07/22/13  0540  CALCIUM  8.6  9.0    ABG Recent Labs   07/20/13  2000  PHART  7.430  PCO2ART  41.0  PO2ART  57.4*    Cardiac Enzymes Recent Labs     07/20/13  2017  07/21/13  0200  07/22/13  0510  TROPONINI  <0.30  <0.30  <0.30    Lab Results  Component Value Date   ANA NEG 06/21/2013   RF 10 06/21/2013   Lab Results  Component Value Date   ESRSEDRATE 49* 06/21/2013    BNP (last 3 results)  Recent Labs  07/12/13 1330 07/20/13 1335  PROBNP 214.8* 63.9    Imaging Dg Chest 2 View  07/23/2013   *RADIOLOGY REPORT*  Clinical Data: Chest pain, cough, continue shortness of breath  CHEST - 2 VIEW  Comparison: July 20, 2013  Findings: There is chronic stable bilateral pleural calcifications. Previously noted opacity in the left lung base is slightly decreased.  There is no pleural effusion.  There is no pulmonary edema.  Mediastinal contour and cardiac silhouette are stable.  The soft tissues and osseous structures are stable.  IMPRESSION: Previously noted patchy consolidation of medial left lung base is slightly decreased compared prior exam.   Original Report Authenticated By: Sherian Rein, M.D.    ASSESSMENT / PLAN:  67 yo female with hx of asthma  and asbestos related pleural plaques.  She has progressive dyspnea.  CT chest from 07/13/13 shows new GGO.  She has received abx recently, and no clinical evidence to suggest infection at present >> defer additional Abx for now.  She has negative fluid balance since admission.  ?if she has acute pneumonitis also. Plan: -continue scheduled BD's for now -change solumedrol to 40 mg IV q12h and wean off as tolerated -hold symbicort while getting schedules nebs and solumedrol -f/u CXR intermittently -oxygen to keep SpO2 > 92% -goal even to negative fluid balance -f/u ESR, BNP -will need full PFT's when pt more stable >> defer until outpt setting  Coralyn Helling, MD [facility] Pulmonary/Critical Care 07/23/2013, 3:05 PM Pager:  [phone] After 3pm call: [phone]

## 2013-07-24 LAB — COMPREHENSIVE METABOLIC PANEL
Albumin: 3.3 g/dL — ABNORMAL LOW (ref 3.5–5.2)
Alkaline Phosphatase: 57 U/L (ref 39–117)
BUN: 25 mg/dL — ABNORMAL HIGH (ref 6–23)
CO2: 27 mEq/L (ref 19–32)
Chloride: 99 mEq/L (ref 96–112)
GFR calc non Af Amer: 85 mL/min — ABNORMAL LOW (ref >90)
Potassium: 4.3 mEq/L (ref 3.5–5.1)
Total Bilirubin: 0.3 mg/dL (ref 0.3–1.2)

## 2013-07-24 LAB — CBC
HCT: 34.9 % — ABNORMAL LOW (ref 36.0–46.0)
Hemoglobin: 11.3 g/dL — ABNORMAL LOW (ref 12.0–15.0)
MCV: 87.3 fL (ref 78.0–100.0)
RBC: 4 MIL/uL (ref 3.87–5.11)
RDW: 18.1 % — ABNORMAL HIGH (ref 11.5–15.5)
WBC: 15.6 10*3/uL — ABNORMAL HIGH (ref 4.0–10.5)

## 2013-07-24 LAB — SEDIMENTATION RATE: Sed Rate: 7 mm/hr (ref 0–22)

## 2013-07-24 MED ORDER — TRAMADOL HCL 50 MG PO TABS
50.0000 mg | ORAL_TABLET | Freq: Four times a day (QID) | ORAL | Status: DC
  Administered 2013-07-24 – 2013-07-25 (×4): 50 mg via ORAL
  Filled 2013-07-24 (×4): qty 1

## 2013-07-24 MED ORDER — OXYCODONE-ACETAMINOPHEN 5-325 MG PO TABS
1.0000 | ORAL_TABLET | ORAL | Status: DC | PRN
  Administered 2013-07-24 – 2013-07-26 (×9): 2 via ORAL
  Filled 2013-07-24 (×9): qty 2

## 2013-07-24 MED ORDER — DEXTROMETHORPHAN POLISTIREX 30 MG/5ML PO LQCR
30.0000 mg | Freq: Two times a day (BID) | ORAL | Status: DC
Start: 2013-07-24 — End: 2013-07-26
  Administered 2013-07-24 – 2013-07-26 (×5): 30 mg via ORAL
  Filled 2013-07-24 (×6): qty 5

## 2013-07-24 MED ORDER — OXYCODONE-ACETAMINOPHEN 5-325 MG PO TABS
1.0000 | ORAL_TABLET | ORAL | Status: DC | PRN
  Administered 2013-07-24: 1 via ORAL
  Filled 2013-07-24: qty 2

## 2013-07-24 MED ORDER — HYDRALAZINE HCL 20 MG/ML IJ SOLN
5.0000 mg | Freq: Four times a day (QID) | INTRAMUSCULAR | Status: DC | PRN
  Administered 2013-07-25: 5 mg via INTRAVENOUS
  Filled 2013-07-24: qty 1

## 2013-07-24 MED ORDER — METOPROLOL SUCCINATE ER 25 MG PO TB24
25.0000 mg | ORAL_TABLET | Freq: Every day | ORAL | Status: DC
  Administered 2013-07-24: 25 mg via ORAL
  Filled 2013-07-24 (×2): qty 1

## 2013-07-24 MED ORDER — PREDNISONE 50 MG PO TABS
60.0000 mg | ORAL_TABLET | Freq: Every day | ORAL | Status: DC
  Administered 2013-07-25: 60 mg via ORAL
  Filled 2013-07-24 (×2): qty 1

## 2013-07-24 MED ORDER — PANTOPRAZOLE SODIUM 40 MG PO TBEC
40.0000 mg | DELAYED_RELEASE_TABLET | Freq: Two times a day (BID) | ORAL | Status: DC
  Administered 2013-07-24 – 2013-07-26 (×5): 40 mg via ORAL
  Filled 2013-07-24 (×4): qty 1

## 2013-07-24 MED ORDER — NEBIVOLOL HCL 5 MG PO TABS
5.0000 mg | ORAL_TABLET | Freq: Every day | ORAL | Status: DC
  Administered 2013-07-25 – 2013-07-26 (×2): 5 mg via ORAL
  Filled 2013-07-24 (×2): qty 1

## 2013-07-24 NOTE — Progress Notes (Signed)
[facility] PROGRESS NOTE  Linda [name] ZOX:[id number] DOB: Jan 28, 1946 DOA: 07/20/2013  PCP: Jeoffrey Massed, MD  Brief HPI: 67 year old female with a history of asbestosis, Asthma, hypertension, depression, fibromyalgia, hyperlipidemia, and diastolic CHF was discharged from the hospital on 07/19/2013. Her hospital stay was significant for respiratory failure which was thought to be multifactorial including COPD with a possible component of heart failure. The patient completed 5 days of Levaquin and was sent home with a prednisone taper. CT angiogram of the chest on 07/13/2013 was negative for pulmonary embolus but showed increased pulmonary vascularity and scattered groundglass opacities in the bilateral upper lung sounds. The patient also had a Myoview performed which was negative for any reversible ischemia. EF on the Myoview was 91%. Sonogram of the lower extremities was negative for DVT. The patient was doing well on the night after discharge. When she woke up the morning of admission, the patient had generalized weakness and shortness of breath with exertion. She went to the bathroom and had difficulty getting up and needed assistance. There was no worsening fevers, chills, chest discomfort, headache, dizziness, syncope, nausea, vomiting, diarrhea, diaphoresis. Because of the above symptoms, the patient's family brought the patient back to the emergency department at [facility]. Evaluation in the ED revealed a chest x-ray that suggested a left medial base consolidation which was new. WBC was 8.1. BMP was unremarkable with a serum creatinine of 1.10. Hepatic enzymes were negative. EKG showed an unchanged RBBB. And hepatic enzymes were negative.  Consultants: Pulmonology  Procedures: None  Antibiotics: None  Subjective: Patient slowly improving. Less anxious today. Breathing is better.  Objective: Vital Signs  Filed Vitals:   07/23/13 2105 07/24/13 0217 07/24/13 0344 07/24/13 0806   BP:   181/83   Pulse:   92   Temp:   97.9 F (36.6 C)   TempSrc:   Oral   Resp:   18   Height:      Weight:   77.3 kg (170 lb 6.7 oz)   SpO2: 95% 94% 93% 94%    Intake/Output Summary (Last 24 hours) at 07/24/13 1302 Last data filed at 07/24/13 0448  Gross per 24 hour  Intake      3 ml  Output    800 ml  Net   -797 ml   Filed Weights   07/22/13 0357 07/23/13 0404 07/24/13 0344  Weight: 76.6 kg (168 lb 14 oz) 77.2 kg (170 lb 3.1 oz) 77.3 kg (170 lb 6.7 oz)   General appearance: alert, cooperative, appears stated age and no distress Resp: Less wheezing today. No crackles. Cardio: regular rate and rhythm, S1, S2 normal, no murmur, click, rub or gallop GI: soft, non-tender; bowel sounds normal; no masses,  no organomegaly Extremities: extremities normal, atraumatic, no cyanosis or edema Neurologic: Alert, Anxious. No cranial nerve deficits. No focal deficits.  Lab Results:  Basic Metabolic Panel:  Recent Labs Lab 07/19/13 0425 07/20/13 1335 07/21/13 0200 07/22/13 0540 07/24/13 0415  NA 138 139 137 137 137  K 4.2 4.1 3.6 4.0 4.3  CL 99 101 97 100 99  CO2 29 27 28 25 27   GLUCOSE 144* 96 123* 186* 152*  BUN 42* 34* 33* 22 25*  CREATININE 1.14* 1.10 1.16* 0.75 0.78  CALCIUM 9.2 9.2 8.6 9.0 9.3   Liver Function Tests:  Recent Labs Lab 07/19/13 0425 07/20/13 1335 07/24/13 0415  AST 15 13 12   ALT 18 16 17   ALKPHOS 65 60 57  BILITOT 0.3 0.4 0.3  PROT 6.6 6.2 6.2  ALBUMIN 3.3* 3.3* 3.3*   CBC:  Recent Labs Lab 07/20/13 1335 07/21/13 0200 07/22/13 0540 07/24/13 0415  WBC 8.1 9.4 15.0* 15.6*  NEUTROABS 4.5  --   --   --   HGB 11.5* 12.1 12.0 11.3*  HCT 35.7* 36.4 35.8* 34.9*  MCV 89.0 87.1 85.4 87.3  PLT 180 180 198 194   Cardiac Enzymes:  Recent Labs Lab 07/18/13 0800 07/20/13 1335 07/20/13 2017 07/21/13 0200 07/22/13 0510  CKTOTAL  --   --  155  --   --   TROPONINI <0.30 <0.30 <0.30 <0.30 <0.30   BNP (last 3 results)  Recent Labs   07/12/13 1330 07/20/13 1335 07/24/13 0415  PROBNP 214.8* 63.9 711.1*   CBG: No results found for this basename: GLUCAP,  in the last 168 hours  Recent Results (from the past 240 hour(s))  CULTURE, BLOOD (ROUTINE X 2)     Status: None   Collection Time    07/20/13  3:20 PM      Result Value Range Status   Specimen Description BLOOD RIGHT WRIST   Final   Special Requests     Final   Value: Normal BOTTLES DRAWN AEROBIC AND ANAEROBIC 5CC EACH   Culture  Setup Time 07/20/2013 23:52   Final   Culture     Final   Value:        BLOOD CULTURE RECEIVED NO GROWTH TO DATE CULTURE WILL BE HELD FOR 5 DAYS BEFORE ISSUING A FINAL NEGATIVE REPORT   Report Status PENDING   Incomplete      Studies/Results: Dg Chest 2 View  07/23/2013   *RADIOLOGY REPORT*  Clinical Data: Chest pain, cough, continue shortness of breath  CHEST - 2 VIEW  Comparison: July 20, 2013  Findings: There is chronic stable bilateral pleural calcifications. Previously noted opacity in the left lung base is slightly decreased.  There is no pleural effusion.  There is no pulmonary edema.  Mediastinal contour and cardiac silhouette are stable.  The soft tissues and osseous structures are stable.  IMPRESSION: Previously noted patchy consolidation of medial left lung base is slightly decreased compared prior exam.   Original Report Authenticated By: Sherian Rein, M.D.    Medications:  Scheduled: . ipratropium  0.5 mg Nebulization Q6H   And  . albuterol  2.5 mg Nebulization Q6H  . ALPRAZolam  0.5 mg Oral QHS  . ALPRAZolam  1 mg Oral Custom  . aspirin EC  81 mg Oral Daily  . atorvastatin  20 mg Oral q1800  . clopidogrel  75 mg Oral Q breakfast  . dextromethorphan  30 mg Oral BID  . enoxaparin (LOVENOX) injection  40 mg Subcutaneous Q24H  . methylPREDNISolone (SOLU-MEDROL) injection  40 mg Intravenous Q12H  . Milnacipran  50 mg Oral BID  . [START ON 07/25/2013] nebivolol  5 mg Oral Daily  . pantoprazole  40 mg Oral BID AC  . [START  ON 07/25/2013] predniSONE  60 mg Oral Q breakfast  . pregabalin  300 mg Oral BID  . sodium chloride  3 mL Intravenous Q12H  . traMADol  50 mg Oral Q6H  . traZODone  50 mg Oral QHS   Continuous:  ZOX:[id number], acetaminophen, albuterol, hydrALAZINE, nitroGLYCERIN, ondansetron (ZOFRAN) IV  Assessment/Plan:  Active Problems:   HTN (hypertension), benign   Fibromyalgia syndrome   AKI (acute kidney injury)   Physical deconditioning   Dyspnea on exertion   Pleural plaque with presence of asbestos  Hypoxemia    Pulmonary infiltrate  Due to lack of symptoms and other data suggestive of infection she was not initiated on antibiotics. She was recently treated with Levaquin as well. Will continue to watch off antibiotics. Since she reports more wheezing, will repeat CXR today.  Dyspnea/Wheezing Seems to be improving some. Continue current management. Taper steroids from tomorrow. Symptoms mostly chronic but patient not a great historian at times. Appreciate input from Pulmonology. PFT's done but not optimal. Will need as OP.  Will continue q6h nebs and IV steroids. Recent CTA was negative for PE. Troponin is normal. CP most likely driven by anxiety and/or fibromyalgia. Has had work up recently with stress test which was negative for ischemia. Change to selective BB due to wheezing. Repeat CXR shows improving infiltrate.  Syncope on 7/30 Unclear what happened. Could have been induced by respiratory difficulties or could have dropped BP. Orthostatics checked later did not show drop in BP. Tele doesn't show any arrythmias. No further episodes.  Mild Acute kidney injury  Resolved. Continue to monitor.   Hypotension/H/o Hypertension BP is now hypertensive. Resume BB. Change to selective BB due to wheezing. Not orthostatic 7/30.   History of CAD Has RCA disease based on a cath done earlier this year per cards. But stress test done recently was negative for ischemia. Continue Plavix. BB as  tolerated.  Fibromyalgia  Continue Lyrica and Savella. Requesting change to Percocet from Vicodin.  Anxiety Disorder On Xanax. Cannot add lexapro as patient is already on savella and can cause significant side effects.  Generalized weakness  Likely due to dyspnea. TSh was normal.  Chronic Back Pain Continue home meds.  Code Status:  Full Code DVT Prophylaxis:   Lovenox Family Communication: Discussed with patient. POA is Orpha Dain (son) [phone]. Disposition Plan: Needs home health on discharge. Anticipate DC on 8/4.    LOS: 4 days   [facility]  [facility] Pager [phone] 07/24/2013, 1:02 PM  If 8PM-8AM, please contact night-coverage at [email or web address], password [facility]

## 2013-07-24 NOTE — Progress Notes (Signed)
PULMONARY  / CRITICAL CARE MEDICINE  Name: Linda [name] MRN: [id number] DOB: 1946/09/20    ADMISSION DATE:  07/20/2013 CONSULTATION DATE:  07/22/2013  REFERRING MD :  Dr. Rito Ehrlich PRIMARY SERVICE:  [facility]  CHIEF COMPLAINT:  SOB  BRIEF PATIENT DESCRIPTION:  67 yo female with progressive dyspnea with hx of asbestos related pleural plaques and asthma.  SIGNIFICANT EVENTS:  STUDIES:  Echo 01/30/13 >> EF 55 to 60% CT chest 03/16/13 >> calcified pleural plaques b/l, no other acute lung findings CT chest 07/13/13 >> mild GGO b/l upper lobes, b/l calcified pleural plaques, coronary calcification B/L lower extremity doppler 07/14/13 >> no DVT Spirometry 07/23/13 >> poor effort, suggestive of restrictive defect, no BD response  CULTURES: Blood 7/29>>>  ANTIBIOTICS: none  SUBJECTIVE: Very hoarse   Feels better compared to admission, but not back to baseline.  VITAL SIGNS: Temp:  [97.8 F (36.6 C)-97.9 F (36.6 C)] 97.9 F (36.6 C) (08/02 0344) Pulse Rate:  [92-94] 92 (08/02 0344) Resp:  [18] 18 (08/02 0344) BP: (150-181)/(68-83) 181/83 mmHg (08/02 0344) SpO2:  [93 %-96 %] 94 % (08/02 0806) Weight:  [170 lb 6.7 oz (77.3 kg)] 170 lb 6.7 oz (77.3 kg) (08/02 0344) 2 liters [place]  PHYSICAL EXAMINATION: General: no distress but classic [place]ze Neuro: Alert, normal strength HEENT: no sinus tenderness Cardiovascular: regular, no murmur Lungs: b/l rhonchi, no wheeze  Abdomen: soft, non tender Musculoskeletal: no edema  CBC Recent Labs     07/22/13  0540  07/24/13  0415  WBC  15.0*  15.6*  HGB  12.0  11.3*  HCT  35.8*  34.9*  PLT  198  194    BMET Recent Labs     07/22/13  0540  07/24/13  0415  NA  137  137  K  4.0  4.3  CL  100  99  CO2  25  27  BUN  22  25*  CREATININE  0.75  0.78  GLUCOSE  186*  152*    Electrolytes Recent Labs     07/22/13  0540  07/24/13  0415  CALCIUM  9.0  9.3    ABG No results found for this basename: PHART, PCO2ART, PO2ART,   in the last 72 hours  Cardiac Enzymes Recent Labs     07/22/13  0510  07/24/13  0415  TROPONINI  <0.30   --   PROBNP   --   711.1*    Lab Results  Component Value Date   ANA NEG 06/21/2013   RF 10 06/21/2013   Lab Results  Component Value Date   ESRSEDRATE 7 07/24/2013    BNP (last 3 results)  Recent Labs  07/12/13 1330 07/20/13 1335 07/24/13 0415  PROBNP 214.8* 63.9 711.1*    Imaging Dg Chest 2 View  07/23/2013   *RADIOLOGY REPORT*  Clinical Data: Chest pain, cough, continue shortness of breath  CHEST - 2 VIEW  Comparison: July 20, 2013  Findings: There is chronic stable bilateral pleural calcifications. Previously noted opacity in the left lung base is slightly decreased.  There is no pleural effusion.  There is no pulmonary edema.  Mediastinal contour and cardiac silhouette are stable.  The soft tissues and osseous structures are stable.  IMPRESSION: Previously noted patchy consolidation of medial left lung base is slightly decreased compared prior exam.   Original Report Authenticated By: Sherian Rein, M.D.    ASSESSMENT / PLAN:  67 yo female with hx of asthma and asbestos related pleural plaques.  She has progressive dyspnea.  CT chest from 07/13/13 shows new GGO.  She has received abx recently, and no clinical evidence to suggest infection at present >> defer additional Abx for now.  She has negative fluid balance since admission.  ?if she has acute pneumonitis also. Plan: -continue scheduled BD's for now -change solumedrol to 40 mg IV q12h and wean off as tolerated -hold symbicort while getting schedules nebs and solumedrol -oxygen to keep SpO2 > 92% -goal even to negative fluid balance -f/u ESR 8/2 = 7 with  BNP 711 -will need full PFT's when pt more stable >> defer until outpt setting  2) severe hoarseness ? Element of VCD - max gerd rx/ tramadol to suppress urge to clear throat    Sandrea Hughs, MD Pulmonary and Critical Care Medicine [facility] Cell  [phone] After 5:30 PM or weekends, call [phone]

## 2013-07-25 LAB — TROPONIN I: Troponin I: <0.3 ng/mL (ref <0.30)

## 2013-07-25 LAB — PRO B NATRIURETIC PEPTIDE: Pro B Natriuretic peptide (BNP): 499.9 pg/mL — ABNORMAL HIGH (ref 0–125)

## 2013-07-25 MED ORDER — TRAMADOL HCL 50 MG PO TABS
100.0000 mg | ORAL_TABLET | Freq: Four times a day (QID) | ORAL | Status: DC
  Administered 2013-07-25 – 2013-07-26 (×5): 100 mg via ORAL
  Filled 2013-07-25 (×5): qty 2

## 2013-07-25 MED ORDER — ISOSORBIDE MONONITRATE ER 30 MG PO TB24
30.0000 mg | ORAL_TABLET | Freq: Every day | ORAL | Status: DC
  Administered 2013-07-25 – 2013-07-26 (×2): 30 mg via ORAL
  Filled 2013-07-25 (×2): qty 1

## 2013-07-25 MED ORDER — FAMOTIDINE 20 MG PO TABS
20.0000 mg | ORAL_TABLET | Freq: Every day | ORAL | Status: DC
  Administered 2013-07-25: 20 mg via ORAL
  Filled 2013-07-25 (×3): qty 1

## 2013-07-25 NOTE — Progress Notes (Signed)
PT c/o chest tightness, pain, and pressure. Pt rated 8/10.  BP 141/60. HR 103. 1 NTG SL given.  After 5 mins, Sx still persist. BP 117/60. Pain 6/10. 1 NTG SL given.  After another 5 mins, Sx resolving but pain still present: 4/10. 1 NTG SL given. After 5 mins, Sx resolved. BP 122/63. Will continue to monitor pt closely.

## 2013-07-25 NOTE — Progress Notes (Signed)
[facility] PROGRESS NOTE  Linda [name] JYN:[id number] DOB: [birth year] DOA: 07/20/2013  PCP: Jeoffrey Massed, MD  Brief HPI: 67 year old female with a history of asbestosis, Asthma, hypertension, depression, fibromyalgia, hyperlipidemia, and diastolic CHF was discharged from the hospital on 07/19/2013. Her hospital stay was significant for respiratory failure which was thought to be multifactorial including COPD with a possible component of heart failure. The patient completed 5 days of Levaquin and was sent home with a prednisone taper. CT angiogram of the chest on 07/13/2013 was negative for pulmonary embolus but showed increased pulmonary vascularity and scattered groundglass opacities in the bilateral upper lung sounds. The patient also had a Myoview performed which was negative for any reversible ischemia. EF on the Myoview was 91%. Sonogram of the lower extremities was negative for DVT. The patient was doing well on the night after discharge. When she woke up the morning of admission, the patient had generalized weakness and shortness of breath with exertion. She went to the bathroom and had difficulty getting up and needed assistance. There was no worsening fevers, chills, chest discomfort, headache, dizziness, syncope, nausea, vomiting, diarrhea, diaphoresis. Because of the above symptoms, the patient's family brought the patient back to the emergency department at [facility]. Evaluation in the ED revealed a chest x-ray that suggested a left medial base consolidation which was new. WBC was 8.1. BMP was unremarkable with a serum creatinine of 1.10. Hepatic enzymes were negative. EKG showed an unchanged RBBB. And hepatic enzymes were negative.  Consultants: Pulmonology  Procedures: None  Antibiotics: None  Subjective: Patient feels better. In better spirits today. Did have some chest tightness earlier today associated with stress about her husband. Now resolved.   Objective: Vital  Signs  Filed Vitals:   07/25/13 0948 07/25/13 0955 07/25/13 1008 07/25/13 1302  BP: 117/60 118/58 122/63 141/63  Pulse: 112 114 107 93  Temp:    98.4 F (36.9 C)  TempSrc:    Oral  Resp:    16  Height:      Weight:      SpO2:    96%    Intake/Output Summary (Last 24 hours) at 07/25/13 1338 Last data filed at 07/25/13 1302  Gross per 24 hour  Intake    600 ml  Output   1250 ml  Net   -650 ml   Filed Weights   07/22/13 0357 07/23/13 0404 07/24/13 0344  Weight: 76.6 kg (168 lb 14 oz) 77.2 kg (170 lb 3.1 oz) 77.3 kg (170 lb 6.7 oz)   General appearance: alert, cooperative, appears stated age and no distress Resp: Less wheezing today. No crackles. Cardio: regular rate and rhythm, S1, S2 normal, no murmur, click, rub or gallop GI: soft, non-tender; bowel sounds normal; no masses,  no organomegaly Extremities: extremities normal, atraumatic, no cyanosis or edema Neurologic: Alert, Anxious. No cranial nerve deficits. No focal deficits.  Lab Results:  Basic Metabolic Panel:  Recent Labs Lab 07/19/13 0425 07/20/13 1335 07/21/13 0200 07/22/13 0540 07/24/13 0415  NA 138 139 137 137 137  K 4.2 4.1 3.6 4.0 4.3  CL 99 101 97 100 99  CO2 29 27 28 25 27   GLUCOSE 144* 96 123* 186* 152*  BUN 42* 34* 33* 22 25*  CREATININE 1.14* 1.10 1.16* 0.75 0.78  CALCIUM 9.2 9.2 8.6 9.0 9.3   Liver Function Tests:  Recent Labs Lab 07/19/13 0425 07/20/13 1335 07/24/13 0415  AST 15 13 12   ALT 18 16 17   ALKPHOS 65 60 57  BILITOT 0.3 0.4 0.3  PROT 6.6 6.2 6.2  ALBUMIN 3.3* 3.3* 3.3*   CBC:  Recent Labs Lab 07/20/13 1335 07/21/13 0200 07/22/13 0540 07/24/13 0415  WBC 8.1 9.4 15.0* 15.6*  NEUTROABS 4.5  --   --   --   HGB 11.5* 12.1 12.0 11.3*  HCT 35.7* 36.4 35.8* 34.9*  MCV 89.0 87.1 85.4 87.3  PLT 180 180 198 194   Cardiac Enzymes:  Recent Labs Lab 07/20/13 1335 07/20/13 2017 07/21/13 0200 07/22/13 0510 07/25/13 1215  CKTOTAL  --  155  --   --   --   TROPONINI  <0.30 <0.30 <0.30 <0.30 <0.30   BNP (last 3 results)  Recent Labs  07/20/13 1335 07/24/13 0415 07/25/13 1215  PROBNP 63.9 711.1* 499.9*   CBG: No results found for this basename: GLUCAP,  in the last 168 hours  Recent Results (from the past 240 hour(s))  CULTURE, BLOOD (ROUTINE X 2)     Status: None   Collection Time    07/20/13  3:20 PM      Result Value Range Status   Specimen Description BLOOD RIGHT WRIST   Final   Special Requests     Final   Value: Normal BOTTLES DRAWN AEROBIC AND ANAEROBIC 5CC EACH   Culture  Setup Time 07/20/2013 23:52   Final   Culture     Final   Value:        BLOOD CULTURE RECEIVED NO GROWTH TO DATE CULTURE WILL BE HELD FOR 5 DAYS BEFORE ISSUING A FINAL NEGATIVE REPORT   Report Status PENDING   Incomplete      Studies/Results: No results found.  Medications:  Scheduled: . ALPRAZolam  0.5 mg Oral QHS  . ALPRAZolam  1 mg Oral Custom  . aspirin EC  81 mg Oral Daily  . atorvastatin  20 mg Oral q1800  . clopidogrel  75 mg Oral Q breakfast  . dextromethorphan  30 mg Oral BID  . enoxaparin (LOVENOX) injection  40 mg Subcutaneous Q24H  . famotidine  20 mg Oral QHS  . Milnacipran  50 mg Oral BID  . nebivolol  5 mg Oral Daily  . pantoprazole  40 mg Oral BID AC  . pregabalin  300 mg Oral BID  . sodium chloride  3 mL Intravenous Q12H  . traMADol  100 mg Oral Q6H  . traZODone  50 mg Oral QHS   Continuous:  ION:[id number], acetaminophen, albuterol, hydrALAZINE, nitroGLYCERIN, ondansetron (ZOFRAN) IV, oxyCODONE-acetaminophen  Assessment/Plan:  Active Problems:   HTN (hypertension), benign   Fibromyalgia syndrome   AKI (acute kidney injury)   Physical deconditioning   Dyspnea on exertion   Pleural plaque with presence of asbestos   Hypoxemia    Pulmonary infiltrate  Due to lack of symptoms and other data suggestive of infection she was not initiated on antibiotics. She was recently treated with Levaquin as well. Will continue to watch  off antibiotics. Repeat CXR shows improving infiltrate.   Dyspnea/Wheezing Seems to be improving some. Continue current management. Symptoms mostly chronic but patient not a great historian at times. Appreciate input from Pulmonology. PFT's done but not optimal. Will need as OP. Will continue q6h nebs. Steroids stopped by pulmonology. Recent CTA was negative for PE. Troponin is normal. CP most likely driven by anxiety and/or fibromyalgia. Has had work up recently with stress test which was negative for ischemia. Change to selective BB due to wheezing. Repeat CXR shows improving infiltrate.  Syncope on 7/30 Unclear  what happened. Could have been induced by respiratory difficulties or could have dropped BP. Orthostatics checked later did not show drop in BP. Tele doesn't show any arrythmias. No further episodes.  Mild Acute kidney injury  Resolved. Continue to monitor.   Hypotension/H/o Hypertension BP is now hypertensive. Resume BB. Changed to selective BB due to wheezing. Not orthostatic 7/30.   History of CAD Has RCA disease based on a cath done earlier this year per cards. But stress test done recently was negative for ischemia. Continue Plavix. BB as tolerated. Recurrent chest pains most likely from anxiety. Will check EKG. Trop is normal. Will add Imdur. No indication to call back cardiology considering very recent work up.  Fibromyalgia  Continue Lyrica and Savella. Pain meds changed to Percocet from Vicodin.  Anxiety Disorder On Xanax. Cannot add lexapro as patient is already on savella and can cause significant side effects.  Generalized weakness  Likely due to dyspnea. TSh was normal.  Chronic Back Pain Stable.  Code Status:  Full Code DVT Prophylaxis:   Lovenox Family Communication: Discussed with patient. POA is Linda [name] (son) [phone]. Disposition Plan: Needs home health on discharge. Anticipate DC on 8/4 if cleared by pulmonology.    LOS: 5 days    [facility]  [facility] Pager [phone] 07/25/2013, 1:38 PM  If 8PM-8AM, please contact night-coverage at [email or web address], password [facility]

## 2013-07-25 NOTE — Progress Notes (Addendum)
PULMONARY  / CRITICAL CARE MEDICINE  Name: Linda [name] MRN: [id number] DOB: 1946/12/22    ADMISSION DATE:  07/20/2013 CONSULTATION DATE:  07/22/2013  REFERRING MD :  Dr. Rito Ehrlich PRIMARY SERVICE:  [facility]  CHIEF COMPLAINT:  SOB  BRIEF PATIENT DESCRIPTION:  67 yo female with progressive dyspnea with hx of asbestos related pleural plaques and asthma.  SIGNIFICANT EVENTS:  STUDIES:  Echo 01/30/13 >> EF 55 to 60% CT chest 03/16/13 >> calcified pleural plaques b/l, no other acute lung findings CT chest 07/13/13 >> mild GGO b/l upper lobes, b/l calcified pleural plaques, coronary calcification B/L lower extremity doppler 07/14/13 >> no DVT Spirometry 07/23/13 >> poor effort, suggestive of restrictive defect, no BD response  CULTURES: Blood 7/29>>>  ANTIBIOTICS: none  SUBJECTIVE: Very hoarse   Feels better compared to admission, but not back to baseline. Having resting cp "like my angina" this am but "it gets worse and shoots into my jaw" but not doing so at present and no diaph or nausea, relieved after NTG  VITAL SIGNS: Temp:  [98.5 F (36.9 C)-98.7 F (37.1 C)] 98.5 F (36.9 C) (08/03 0453) Pulse Rate:  [89-114] 107 (08/03 1008) Resp:  [18] 18 (08/03 0453) BP: (117-176)/(58-79) 122/63 mmHg (08/03 1008) SpO2:  [94 %-96 %] 95 % (08/03 0453) FIO2  RA   PHYSICAL EXAMINATION: General: no distress but classic pseudowheeze Neuro: Alert, normal strength HEENT: no sinus tenderness Cardiovascular: regular, no murmur Lungs: bilateral coarse insp crackles Abdomen: soft, non tender Musculoskeletal: no edema  CBC Recent Labs     07/24/13  0415  WBC  15.6*  HGB  11.3*  HCT  34.9*  PLT  194    BMET Recent Labs     07/24/13  0415  NA  137  K  4.3  CL  99  CO2  27  BUN  25*  CREATININE  0.78  GLUCOSE  152*    Electrolytes Recent Labs     07/24/13  0415  CALCIUM  9.3    ABG No results found for this basename: PHART, PCO2ART, PO2ART,  in the last 72  hours  Cardiac Enzymes Recent Labs     07/24/13  0415  PROBNP  711.1*    Lab Results  Component Value Date   ANA NEG 06/21/2013   RF 10 06/21/2013   Lab Results  Component Value Date   ESRSEDRATE 7 07/24/2013    BNP (last 3 results)  Recent Labs  07/12/13 1330 07/20/13 1335 07/24/13 0415  PROBNP 214.8* 63.9 711.1*    Imaging Dg Chest 2 View  07/23/2013   *RADIOLOGY REPORT*  Clinical Data: Chest pain, cough, continue shortness of breath  CHEST - 2 VIEW  Comparison: July 20, 2013  Findings: There is chronic stable bilateral pleural calcifications. Previously noted opacity in the left lung base is slightly decreased.  There is no pleural effusion.  There is no pulmonary edema.  Mediastinal contour and cardiac silhouette are stable.  The soft tissues and osseous structures are stable.  IMPRESSION: Previously noted patchy consolidation of medial left lung base is slightly decreased compared prior exam.   Original Report Authenticated By: Sherian Rein, M.D.    ASSESSMENT / PLAN:  67 yo female with hx of asthma and asbestos related pleural plaques.  She has progressive dyspnea.  CT chest from 07/13/13 shows new GGO.  She has received abx recently, and no clinical evidence to suggest infection at present >> defer additional Abx for now.  She has  negative fluid balance since admission.  ?if she has acute pneumonitis also. -   ESR 8/2 = 7 with  BNP 711 Plan: -continue scheduled BD's for now -d/c solumedrol 8/3 with no true wheeze and esr only 7 -hold symbicort while getting schedules nebs and solumedrol -oxygen to keep SpO2 > 92% weaned off 8/3  -goal even to negative fluid balance  -will need full PFT's when pt more stable >> defer until outpt setting  2) severe hoarseness ? Element of VCD - max gerd rx/ tramadol to suppress urge to clear throat  3) Freq uti's, think's she has used macrodantin in past but not recently rec Asked her to check with her pharmacy to see when last dose  was and not take this again as would be indistinguishable from asbestos related PF  4) ? Unstable angina, cp at rest  - recheck bnp, trop am 8/3 > consider Harwani re-eval    Sandrea Hughs, MD Pulmonary and Critical Care Medicine [facility] Cell [phone] After 5:30 PM or weekends, call [phone]

## 2013-07-26 LAB — CULTURE, BLOOD (ROUTINE X 2): Culture: NO GROWTH

## 2013-07-26 MED ORDER — POTASSIUM CHLORIDE ER 10 MEQ PO TBCR: 10.0000 meq | EXTENDED_RELEASE_TABLET | Freq: Every day | ORAL | Status: DC

## 2013-07-26 MED ORDER — NEBIVOLOL HCL 5 MG PO TABS: 5.0000 mg | ORAL_TABLET | Freq: Every day | ORAL | Status: DC

## 2013-07-26 MED ORDER — OMEPRAZOLE 20 MG PO CPDR: 20.0000 mg | DELAYED_RELEASE_CAPSULE | Freq: Two times a day (BID) | ORAL | Status: DC

## 2013-07-26 MED ORDER — TEMAZEPAM 7.5 MG PO CAPS: 7.5000 mg | ORAL_CAPSULE | Freq: Every evening | ORAL | Status: DC | PRN

## 2013-07-26 MED ORDER — FUROSEMIDE 20 MG PO TABS: 20.0000 mg | ORAL_TABLET | Freq: Every day | ORAL | Status: DC

## 2013-07-26 MED ORDER — FAMOTIDINE 20 MG PO TABS: 20.0000 mg | ORAL_TABLET | Freq: Every day | ORAL | Status: DC

## 2013-07-26 MED ORDER — ALBUTEROL SULFATE (5 MG/ML) 0.5% IN NEBU: 2.5000 mg | INHALATION_SOLUTION | RESPIRATORY_TRACT | Status: DC | PRN

## 2013-07-26 MED ORDER — OXYCODONE-ACETAMINOPHEN 5-325 MG PO TABS: 1.0000 | ORAL_TABLET | ORAL | Status: DC | PRN

## 2013-07-26 MED ORDER — ALPRAZOLAM 1 MG PO TABS: 0.5000 mg | ORAL_TABLET | Freq: Three times a day (TID) | ORAL | Status: DC

## 2013-07-26 MED ORDER — ISOSORBIDE MONONITRATE ER 30 MG PO TB24: 30.0000 mg | ORAL_TABLET | Freq: Every day | ORAL | Status: DC

## 2013-07-26 NOTE — Progress Notes (Signed)
PULMONARY  / CRITICAL CARE MEDICINE  Name: Linda [name] MRN: [id number] DOB: 11/18/1946    ADMISSION DATE:  07/20/2013 CONSULTATION DATE:  07/22/2013  REFERRING MD :  Dr. Rito Ehrlich PRIMARY SERVICE:  [facility]  CHIEF COMPLAINT:  SOB  BRIEF PATIENT DESCRIPTION:  67 yo female with progressive dyspnea with hx of asbestos related pleural plaques and asthma.  SIGNIFICANT EVENTS:  STUDIES:  Echo 01/30/13 >> EF 55 to 60% CT chest 03/16/13 >> calcified pleural plaques b/l, no other acute lung findings CT chest 07/13/13 >> mild GGO b/l upper lobes, b/l calcified pleural plaques, coronary calcification B/L lower extremity doppler 07/14/13 >> no DVT Spirometry 07/23/13 >> poor effort, suggestive of restrictive defect, no BD response  CULTURES: Blood 7/29>>>ng  ANTIBIOTICS: none  SUBJECTIVE: No CP Walked Dyspnea 50% better Wheezing +  VITAL SIGNS: Temp:  [98.2 F (36.8 C)-98.4 F (36.9 C)] 98.3 F (36.8 C) (08/04 0436) Pulse Rate:  [80-93] 80 (08/04 0436) Resp:  [16-18] 18 (08/04 0436) BP: (102-141)/(56-68) 103/63 mmHg (08/04 1017) SpO2:  [96 %] 96 % (08/04 0900) Weight:  [78.4 kg (172 lb 13.5 oz)] 78.4 kg (172 lb 13.5 oz) (08/04 0436) FIO2  RA   PHYSICAL EXAMINATION: General: no distress  Neuro: Alert, normal strength HEENT: no sinus tenderness Cardiovascular: regular, no murmur Lungs: rt basal coarse insp crackles, upper airway pseudowheeze Abdomen: soft, non tender Musculoskeletal: no edema  CBC Recent Labs     07/24/13  0415  WBC  15.6*  HGB  11.3*  HCT  34.9*  PLT  194    BMET Recent Labs     07/24/13  0415  NA  137  K  4.3  CL  99  CO2  27  BUN  25*  CREATININE  0.78  GLUCOSE  152*    Electrolytes Recent Labs     07/24/13  0415  CALCIUM  9.3    ABG No results found for this basename: PHART, PCO2ART, PO2ART,  in the last 72 hours  Cardiac Enzymes Recent Labs     07/24/13  0415  07/25/13  1215  TROPONINI   --   <0.30  PROBNP  711.1*   499.9*    Lab Results  Component Value Date   ANA NEG 06/21/2013   RF 10 06/21/2013   Lab Results  Component Value Date   ESRSEDRATE 7 07/24/2013    BNP (last 3 results)  Recent Labs  07/20/13 1335 07/24/13 0415 07/25/13 1215  PROBNP 63.9 711.1* 499.9*    Imaging No results found.  ASSESSMENT / PLAN:  67 yo female with hx of asthma and asbestos related pleural plaques.  She has progressive dyspnea.  CT chest from 07/13/13 shows new GGO not seen 02/2013 .  She has received abx recently, and no clinical evidence to suggest infection at present >> defer additional Abx for now.  She has negative fluid balance since admission.  ?if she has acute pneumonitis also ? Related to macrodantin -   ESR 8/2 = 7 with  BNP 711,-Improved with  negative fluid balance Plan: -continue scheduled BD's for now -d/c solumedrol 8/3 with no true wheeze and esr only 7 -hold symbicort while getting schedules nebs and solumedrol -oxygen to keep SpO2 > 92% weaned off 8/3  -will need full PFT's when pt more stable >> defer until outpt setting -DC macrodantin  2) severe hoarseness ? Element of VCD - max gerd rx/ tramadol to suppress urge to clear throat  3) Freq uti's, think's  she has used macrodantin in past but not recently Unclear when last dose was and not take this again as would be indistinguishable from asbestos related PF  4) ? Unstable angina, vs anxiety ? Diastolic dysfunction - consider Harwani re-eval   D/w pt & son   Cyril Mourning MD. FCCP. [facility] Pager [phone] If no response call [phone]

## 2013-07-26 NOTE — Progress Notes (Signed)
Physical Therapy Treatment Patient Details Name: Linda [name] MRN: [id number] DOB: [birth year] Today's Date: 07/26/2013 Time: 4098-1191 PT Time Calculation (min): 17 min  PT Assessment / Plan / Recommendation  History of Present Illness 67 year old female with a history of asbestosis, hypertension, depression, fibromyalgia, hyperlipidemia, and diastolic CHF was discharged from the hospital on 07/19/2013. Her hospital stay was significant for respiratory failure which was thought to be multifactorial including COPD with a possible component of heart failure. The patient completed 5 days of Levaquin and was sent home with a prednisone taper. CT angiogram of the chest on 07/13/2013 was negative for pulmonary embolus but showed increased pulmonary vascularity and scattered groundglass opacities in the bilateral upper lung sounds. The patient also had a Myoview performed which was negative for any reversible ischemia. EF on the Myoview was 91%. Sonogram of the lower extremities was negative for DVT. The patient was doing well on the night after discharge. When she woke up this morning, the patient had generalized weakness and shortness of breath with exertion. She went to the bathroom and had difficulty getting up and needed assistance. There was no worsening fevers, chills, chest discomfort, headache, dizziness, syncope, nausea, vomiting, diarrhea, diaphoresis.   PT Comments   Pt demonstrates continued progress towards PT goals at this time. Pt still appears anxious with mobility but is able to ambulate without assistive device and negotiate stairs without trouble. Will continue to see patient as indicated and progress as tolerated.   Follow Up Recommendations  Home health PT           Equipment Recommendations  Rolling walker with 5" wheels       Frequency Min 3X/week   Progress towards PT Goals  Progressing towards goals  Plan Current plan remains appropriate    Precautions /  Restrictions Precautions Precautions: Fall Precaution Comments: check O2 Sats Restrictions Weight Bearing Restrictions: No   Pertinent Vitals/Pain Patient reports no pain; SpO2 monitored and remains >94% on rm air at all times    Mobility  Bed Mobility Bed Mobility: Supine to Sit;Sitting - Scoot to Edge of Bed;Sit to Supine Supine to Sit: 6: Modified independent (Device/Increase time);HOB flat Sitting - Scoot to Edge of Bed: 6: Modified independent (Device/Increase time) Sit to Supine: 6: Modified independent (Device/Increase time);HOB flat Transfers Transfers: Sit to Stand;Stand to Sit Sit to Stand: 6: Modified independent (Device/Increase time) Stand to Sit: 6: Modified independent (Device/Increase time) Ambulation/Gait Ambulation/Gait Assistance: 7: Independent Ambulation Distance (Feet): 310 Feet Assistive device: None Gait Pattern: Step-through pattern;Decreased stride length;Shuffle Gait velocity: some instability noted today without assistive device General Gait Details: patient able to ambulate without assistive device Stairs: Yes Stairs Assistance: 5: Supervision;4: Min guard Stairs Assistance Details (indicate cue type and reason): VCs for energy conservation Stair Management Technique: One rail Right;Alternating pattern;Forwards Number of Stairs: 6 Wheelchair Mobility Wheelchair Mobility: No      PT Goals (current goals can now be found in the care plan section) Acute Rehab PT Goals Patient Stated Goal: My husband is my primary focus, but I need to get home. PT Goal Formulation: With patient Time For Goal Achievement: 07/29/13 Potential to Achieve Goals: Good  Visit Information  Last PT Received On: 07/26/13 Assistance Needed: +1 History of Present Illness: 67 year old female with a history of asbestosis, hypertension, depression, fibromyalgia, hyperlipidemia, and diastolic CHF was discharged from the hospital on 07/19/2013. Her hospital stay was significant  for respiratory failure which was thought to be multifactorial including COPD with a possible component  of heart failure. The patient completed 5 days of Levaquin and was sent home with a prednisone taper. CT angiogram of the chest on 07/13/2013 was negative for pulmonary embolus but showed increased pulmonary vascularity and scattered groundglass opacities in the bilateral upper lung sounds. The patient also had a Myoview performed which was negative for any reversible ischemia. EF on the Myoview was 91%. Sonogram of the lower extremities was negative for DVT. The patient was doing well on the night after discharge. When she woke up this morning, the patient had generalized weakness and shortness of breath with exertion. She went to the bathroom and had difficulty getting up and needed assistance. There was no worsening fevers, chills, chest discomfort, headache, dizziness, syncope, nausea, vomiting, diarrhea, diaphoresis.    Subjective Data  Subjective: I am feeling much better, my husband is doing a little better Patient Stated Goal: My husband is my primary focus, but I need to get home.   Cognition  Cognition Arousal/Alertness: Awake/alert Behavior During Therapy: WFL for tasks assessed/performed Overall Cognitive Status: Within Functional Limits for tasks assessed    Balance  Balance Balance Assessed: Yes Static Sitting Balance Static Sitting - Balance Support: Feet supported;No upper extremity supported Static Sitting - Level of Assistance: 7: Independent Static Sitting - Comment/# of Minutes: [email or web address] Static Standing Balance Static Standing - Balance Support: No upper extremity supported Static Standing - Level of Assistance: 5: Stand by assistance Static Standing - Comment/# of Minutes: some increased instability noted today  End of Session PT - End of Session Equipment Utilized During Treatment: Gait belt Activity Tolerance: Patient tolerated treatment well Patient left:  in chair;with call bell/phone within reach Nurse Communication: Mobility status   GP     Fabio Asa 07/26/2013, 1:58 PM Charlotte Crumb, PT DPT  [phone]

## 2013-07-26 NOTE — Discharge Summary (Signed)
[facility]  Physician Discharge Summary   Patient ID: Linda [name] MRN: [id number] DOB/AGE: 1946-02-11 67 y.o.  Admit date: 07/20/2013 Discharge date: 07/26/2013  PCP: Jeoffrey Massed, MD  DISCHARGE DIAGNOSES:  Active Problems:   HTN (hypertension), benign   Fibromyalgia syndrome   AKI (acute kidney injury)   Physical deconditioning   Dyspnea on exertion   Pleural plaque with presence of asbestos   Hypoxemia   RECOMMENDATIONS FOR OUTPATIENT FOLLOW UP: 1. Home health will be arranged 2. Follow up with Pulm arranged 3. Metoprolol changed to Bystolic 4. Needs Bmet next week as she is being discharged on Lasix  DISCHARGE CONDITION: fair  Diet recommendation: Heart Healthy  Filed Weights   07/23/13 0404 07/24/13 0344 07/26/13 0436  Weight: 77.2 kg (170 lb 3.1 oz) 77.3 kg (170 lb 6.7 oz) 78.4 kg (172 lb 13.5 oz)    INITIAL HISTORY: 67 year old female with a history of asbestosis, Asthma, hypertension, depression, fibromyalgia, hyperlipidemia, and diastolic CHF was discharged from the hospital on 07/19/2013. Her hospital stay was significant for respiratory failure which was thought to be multifactorial including COPD with a possible component of heart failure. The patient completed 5 days of Levaquin and was sent home with a prednisone taper. CT angiogram of the chest on 07/13/2013 was negative for pulmonary embolus but showed increased pulmonary vascularity and scattered groundglass opacities in the bilateral upper lung sounds. The patient also had a Myoview performed which was negative for any reversible ischemia. EF on the Myoview was 91%. Sonogram of the lower extremities was negative for DVT. The patient was doing well on the night after discharge. When she woke up the morning of admission, the patient had generalized weakness and shortness of breath with exertion. She went to the bathroom and had difficulty getting up and needed assistance. There was no worsening  fevers, chills, chest discomfort, headache, dizziness, syncope, nausea, vomiting, diarrhea, diaphoresis. Because of the above symptoms, the patient's family brought the patient back to the emergency department at [facility]. Evaluation in the ED revealed a chest x-ray that suggested a left medial base consolidation which was new. WBC was 8.1. BMP was unremarkable with a serum creatinine of 1.10. Hepatic enzymes were negative. EKG showed an unchanged RBBB. And hepatic enzymes were negative.  Consultations:  Pulmonology  Procedures:  PFT attempted but not successful  HOSPITAL COURSE:   Pulmonary infiltrate  Due to lack of symptoms and other data suggestive of infection she was not initiated on antibiotics. She was recently treated with Levaquin as well. Pulmonology agreed. Repeat CXR shows improving infiltrate.   Dyspnea/Wheezing  Etiology is not clear. Differential diagnosis includes edema, atypical infection, pneumonitis. She did improve som with lasix. Pulm recommends Lasix daily for now. PFt's were attempted but not successful and will need to be pursued as OP. She has improved some with nebs as well. Further work up as OP. Voice rest recommended. No steroids indicated. Will continue q6h nebs. Recent CTA was negative for PE. Troponin is normal. CP most likely driven by anxiety and/or fibromyalgia. Has had work up recently with stress test which was negative for ischemia. Change to selective BB due to wheezing. Repeat CXR shows improving infiltrate. Anxiety also contributing.  Syncope on 7/30  Unclear what happened. Could have been induced by respiratory difficulties or could have dropped BP. Orthostatics checked later did not show drop in BP. Tele doesn't show any arrythmias. No further episodes.   Mild Acute kidney injury  Resolved. Continue to monitor.  Hypotension/H/o Hypertension  BP was initially low but then became hypertensive. Stable currently.   History of CAD  Has RCA disease based  on a cath done earlier this year per cards. But stress test done recently was negative for ischemia. Continue Plavix. BB as tolerated. Recurrent chest pains most likely from anxiety. Will check EKG. Trop is normal. Will add Imdur for possibility of angina.   Fibromyalgia  Continue Lyrica and Savella. Pain meds changed to Percocet from Vicodin.   Anxiety Disorder  On Xanax. Cannot add lexapro as patient is already on savella and can cause significant side effects.   Chronic Back Pain  Stable.  Overall she is better. She will be discharged and will need close follow up with PCP and Pulmonology.   PERTINENT LABS:  The results of significant diagnostics from this hospitalization (including imaging, microbiology, ancillary and laboratory) are listed below for reference.    Microbiology: Recent Results (from the past 240 hour(s))  CULTURE, BLOOD (ROUTINE X 2)     Status: None   Collection Time    07/20/13  3:20 PM      Result Value Range Status   Specimen Description BLOOD RIGHT WRIST   Final   Special Requests     Final   Value: Normal BOTTLES DRAWN AEROBIC AND ANAEROBIC 5CC EACH   Culture  Setup Time 07/20/2013 23:52   Final   Culture NO GROWTH 5 DAYS   Final   Report Status 07/26/2013 FINAL   Final     Labs: Basic Metabolic Panel:  Recent Labs Lab 07/20/13 1335 07/21/13 0200 07/22/13 0540 07/24/13 0415  NA 139 137 137 137  K 4.1 3.6 4.0 4.3  CL 101 97 100 99  CO2 27 28 25 27   GLUCOSE 96 123* 186* 152*  BUN 34* 33* 22 25*  CREATININE 1.10 1.16* 0.75 0.78  CALCIUM 9.2 8.6 9.0 9.3   Liver Function Tests:  Recent Labs Lab 07/20/13 1335 07/24/13 0415  AST 13 12  ALT 16 17  ALKPHOS 60 57  BILITOT 0.4 0.3  PROT 6.2 6.2  ALBUMIN 3.3* 3.3*   CBC:  Recent Labs Lab 07/20/13 1335 07/21/13 0200 07/22/13 0540 07/24/13 0415  WBC 8.1 9.4 15.0* 15.6*  NEUTROABS 4.5  --   --   --   HGB 11.5* 12.1 12.0 11.3*  HCT 35.7* 36.4 35.8* 34.9*  MCV 89.0 87.1 85.4 87.3   PLT 180 180 198 194   Cardiac Enzymes:  Recent Labs Lab 07/20/13 1335 07/20/13 2017 07/21/13 0200 07/22/13 0510 07/25/13 1215  CKTOTAL  --  155  --   --   --   TROPONINI <0.30 <0.30 <0.30 <0.30 <0.30   BNP: BNP (last 3 results)  Recent Labs  07/20/13 1335 07/24/13 0415 07/25/13 1215  PROBNP 63.9 711.1* 499.9*   CBG: No results found for this basename: GLUCAP,  in the last 168 hours   IMAGING STUDIES Dg Chest 2 View  07/23/2013   *RADIOLOGY REPORT*  Clinical Data: Chest pain, cough, continue shortness of breath  CHEST - 2 VIEW  Comparison: July 20, 2013  Findings: There is chronic stable bilateral pleural calcifications. Previously noted opacity in the left lung base is slightly decreased.  There is no pleural effusion.  There is no pulmonary edema.  Mediastinal contour and cardiac silhouette are stable.  The soft tissues and osseous structures are stable.  IMPRESSION: Previously noted patchy consolidation of medial left lung base is slightly decreased compared prior exam.  Original Report Authenticated By: Sherian Rein, M.D.   Dg Chest 2 View  07/20/2013   *RADIOLOGY REPORT*  Clinical Data: Shortness of breath  CHEST - 2 VIEW  Comparison: July 12, 2013  Findings: There is no pulmonary edema, or pleural effusion.  There are stable bilateral pleural calcifications. There is interval developed patchy consolidation of the medial left lung base.  The mediastinal contour and cardiac silhouette are normal.  The soft tissues and osseous structures are stable.  IMPRESSION: Interval developed patchy consolidation of the medial left lung base suspicious for pneumonia.  Stable bilateral calcified pleural plaques.   Original Report Authenticated By: Sherian Rein, M.D.   Ct Angio Chest Pe W/cm &/or Wo Cm  07/13/2013   *RADIOLOGY REPORT*  Clinical Data: Acute onset of shortness of breath with elevated D- dimer.  Known history of prior asbestos exposure.  CT ANGIOGRAPHY CHEST  Technique:   Multidetector CT imaging of the chest using the standard protocol during bolus administration of intravenous contrast. Multiplanar reconstructed images including MIPs were obtained and reviewed to evaluate the vascular anatomy.  Contrast: OMNIPAQUE IOHEXOL 350 MG/ML SOLN  Comparison: Prior CTA of the chest performed at [facility] on 03/16/2013.  Findings: The pulmonary arteries are well opacified.  There is no evidence of acute pulmonary embolism.  Lungs show increase in pulmonary vascularity since the prior study with mild ground-glass airspace disease noted in both upper lung zones.  Findings are suggestive of a component of interstitial edema/CHF.  A trace amount of pleural fluid is present on the left.  Again noted are a multitude of calcified pleural plaques bilaterally with associated areas of pleural thickening.  Findings are consistent with prior asbestos exposure.  Mildly prominent hilar and mediastinal lymph node tissue is again noted which may be reactive.  Calcified plaque is identified in the distribution of the LAD and left circumflex coronary artery.  The heart is mildly enlarged.  No pericardial fluid is seen.  IMPRESSION:  1.  No evidence of acute pulmonary embolism. 2.  Probable component of interstitial edema/CHF with associated cardiomegaly. 3.  Evidence of coronary atherosclerosis with calcified plaque present in the distribution of the LAD and left circumflex coronary artery. 4.  Stable multitude of calcified pleural plaques bilaterally consistent with prior asbestos exposure.   Original Report Authenticated By: Irish Lack, M.D.   Nm Myocar Multi W/spect W/wall Motion / Ef  07/18/2013   *RADIOLOGY REPORT*  Clinical Data:  Chest pain  MYOCARDIAL IMAGING WITH SPECT (REST AND PHARMACOLOGIC-STRESS) GATED LEFT VENTRICULAR WALL MOTION STUDY LEFT VENTRICULAR EJECTION FRACTION  Technique:  Standard myocardial SPECT imaging was performed after resting intravenous injection of 10  mCi Tc-58m tetrofosmin. Subsequently, intravenous infusion of Lexiscan was performed under the supervision of the Cardiology staff.  At peak effect of the drug, 30 mCi Tc-70m tetrofosmin was injected intravenously and standard myocardial SPECT  imaging was performed.  Quantitative gated imaging was also performed to evaluate left ventricular wall motion, and estimate left ventricular ejection fraction.  Comparison:  None.  Findings:  Spect:  Allowing for breast attenuation artifact, there are no perfusion defects.  Wall motion:  Normal.  Ejection fraction:  92%.  End diastolic volume 35 ml.  End-systolic volume 3 ml.  IMPRESSION: Allowing for breast attenuation artifact, there are no perfusion defects.   Original Report Authenticated By: Jolaine Click, M.D.   Dg Chest Port 1 View  07/12/2013   *RADIOLOGY REPORT*  Clinical Data: Shortness of breath.  History of  asbestos damage to lungs.  History of hypertension.  PORTABLE CHEST - 1 VIEW  Comparison: 03/15/2013 chest x-ray and 03/16/2013 chest CT  Findings: Heart size is mildly enlarged.  Pleural calcifications are again noted.  No new consolidation or pleural effusion identified.  No pulmonary edema.  IMPRESSION:  1.  Cardiomegaly without pulmonary edema. 2.  Multiple pleural calcifications, stable in appearance.   Original Report Authenticated By: Norva Pavlov, M.D.    DISCHARGE EXAMINATION: Filed Vitals:   07/26/13 0436 07/26/13 0900 07/26/13 1017 07/26/13 1338  BP: 102/56  103/63 119/69  Pulse: 80   76  Temp: 98.3 F (36.8 C)   98.3 F (36.8 C)  TempSrc: Oral   Oral  Resp: 18   18  Height:      Weight: 78.4 kg (172 lb 13.5 oz)     SpO2: 96% 96%  95%   General appearance: alert, cooperative, appears stated age and no distress Resp: few scattered wheezes. no crackles Cardio: regular rate and rhythm, S1, S2 normal, no murmur, click, rub or gallop Neurologic: Alert and oriented X 3, normal strength and tone. Normal symmetric reflexes. Normal  coordination and gait  DISPOSITION: Home with home health  Discharge Orders   Future Appointments Provider Department Dept Phone   07/28/2013 10:30 AM Jeoffrey Massed, MD [facility] [phone]   08/06/2013 10:00 AM Nyoka Cowden, MD [place] [facility] [phone]   Future Orders Complete By Expires     Diet - low sodium heart healthy  As directed     Discharge instructions  As directed     Comments:      Rest your vocal cords by not talking too much.    Increase activity slowly  As directed        ALLERGIES:  Allergies  Allergen Reactions  . Penicillins Itching, Swelling and Rash    Current Discharge Medication List    START taking these medications   Details  albuterol (PROVENTIL) (5 MG/ML) 0.5% nebulizer solution Take 0.5 mLs (2.5 mg total) by nebulization every 4 (four) hours as needed for wheezing or shortness of breath. Qty: 20 mL, Refills: 3    famotidine (PEPCID) 20 MG tablet Take 1 tablet (20 mg total) by mouth at bedtime. Qty: 30 tablet, Refills: 2    furosemide (LASIX) 20 MG tablet Take 1 tablet (20 mg total) by mouth daily. Qty: 30 tablet, Refills: 1    isosorbide mononitrate (IMDUR) 30 MG 24 hr tablet Take 1 tablet (30 mg total) by mouth daily. Qty: 30 tablet, Refills: 2    nebivolol (BYSTOLIC) 5 MG tablet Take 1 tablet (5 mg total) by mouth daily. Qty: 30 tablet, Refills: 2    omeprazole (PRILOSEC) 20 MG capsule Take 1 capsule (20 mg total) by mouth 2 (two) times daily. Qty: 60 capsule, Refills: 0    oxyCODONE-acetaminophen (PERCOCET/ROXICET) 5-325 MG per tablet Take 1-2 tablets by mouth every 4 (four) hours as needed. Qty: 30 tablet, Refills: 0    potassium chloride (K-DUR) 10 MEQ tablet Take 1 tablet (10 mEq total) by mouth daily. Qty: 30 tablet, Refills: 0    temazepam (RESTORIL) 7.5 MG capsule Take 1 capsule (7.5 mg total) by mouth at bedtime as needed for sleep. DO NOT TAKE WITHIN 5 HOURS OF Prudy Feeler Qty: 30 capsule,  Refills: 0      CONTINUE these medications which have CHANGED   Details  ALPRAZolam (XANAX) 1 MG tablet Take 0.5-1 tablets (0.5-1 mg total)  by mouth 3 (three) times daily. 1mg  in a.m. And noon and 0.5 mg in pm Qty: 30 tablet, Refills: 0      CONTINUE these medications which have NOT CHANGED   Details  albuterol (PROVENTIL HFA;VENTOLIN HFA) 108 (90 BASE) MCG/ACT inhaler Inhale 2 puffs into the lungs every 6 (six) hours as needed for wheezing. For shortness of breath Qty: 1 Inhaler, Refills: 2    aspirin EC 81 MG EC tablet Take 1 tablet (81 mg total) by mouth daily. Qty: 30 tablet, Refills: 3    atorvastatin (LIPITOR) 20 MG tablet Take 1 tablet (20 mg total) by mouth daily at 6 PM. Qty: 30 tablet, Refills: 6    budesonide-formoterol (SYMBICORT) 160-4.5 MCG/ACT inhaler Inhale 2 puffs into the lungs 2 (two) times daily. Qty: 1 Inhaler, Refills: 12    clopidogrel (PLAVIX) 75 MG tablet Take 1 tablet (75 mg total) by mouth daily with breakfast. Qty: 30 tablet, Refills: 3    meloxicam (MOBIC) 7.5 MG tablet Take 7.5 mg by mouth 2 (two) times daily.    Milnacipran (SAVELLA) 50 MG TABS Take 1 tablet (50 mg total) by mouth 2 (two) times daily. Qty: 60 tablet, Refills: 3    Multiple Vitamins-Minerals (CENTRUM SILVER ADULT 50+ PO) Take 1 tablet by mouth daily.    nitroGLYCERIN (NITROSTAT) 0.4 MG SL tablet Place 1 tablet (0.4 mg total) under the tongue every 5 (five) minutes x 3 doses as needed for chest pain. Qty: 60 tablet, Refills: 3    pregabalin (LYRICA) 300 MG capsule Take 1 capsule (300 mg total) by mouth 2 (two) times daily. Qty: 60 capsule, Refills: 6      STOP taking these medications     predniSONE (DELTASONE) 20 MG tablet      amitriptyline (ELAVIL) 100 MG tablet      guaiFENesin (MUCINEX) 600 MG 12 hr tablet      metoprolol succinate (TOPROL-XL) 25 MG 24 hr tablet      traZODone (DESYREL) 50 MG tablet        Follow-up Information   Follow up with Sandrea Hughs, MD  On 08/06/2013. (1000)    Contact information:   [address] [place] [address] [phone]       Follow up with Jeoffrey Massed, MD. Schedule an appointment as soon as possible for a visit in 1 week.   Contact information:   [address] [place] [address] [phone]       TOTAL DISCHARGE TIME: 35 mins  [facility]  [facility] Pager [phone]  07/26/2013, 3:06 PM

## 2013-07-26 NOTE — Plan of Care (Signed)
Problem: Phase III Progression Outcomes Goal: IV/normal saline lock discontinued Outcome: Completed/Met Date Met:  07/26/13 Will d/c d/t pt being discharged.

## 2013-07-26 NOTE — Progress Notes (Signed)
PT DISCHARGE INSTRUCTIONS GIVEN PT VU. PT VOICED CONCERNS OVER NEBULIZERS. CALLED DR. Rito Ehrlich AND DR. ALVA NO NEW ORDERS GIVEN. INSTRUCTIONS GIVEN TO PT TO FOLLOW UP WITH PULMONOLOGY AT APPOINTMENT. PRESCRIPTIONS GIVEN. PT VU.

## 2013-07-27 DIAGNOSIS — J45901 Unspecified asthma with (acute) exacerbation: Secondary | ICD-10-CM | POA: Diagnosis not present

## 2013-07-27 DIAGNOSIS — F341 Dysthymic disorder: Secondary | ICD-10-CM | POA: Diagnosis not present

## 2013-07-27 DIAGNOSIS — I10 Essential (primary) hypertension: Secondary | ICD-10-CM | POA: Diagnosis not present

## 2013-07-27 DIAGNOSIS — IMO0001 Reserved for inherently not codable concepts without codable children: Secondary | ICD-10-CM | POA: Diagnosis not present

## 2013-07-27 DIAGNOSIS — J61 Pneumoconiosis due to asbestos and other mineral fibers: Secondary | ICD-10-CM | POA: Diagnosis not present

## 2013-07-27 DIAGNOSIS — J9600 Acute respiratory failure, unspecified whether with hypoxia or hypercapnia: Secondary | ICD-10-CM | POA: Diagnosis not present

## 2013-07-28 ENCOUNTER — Ambulatory Visit (INDEPENDENT_AMBULATORY_CARE_PROVIDER_SITE_OTHER): Payer: Medicare Other | Admitting: Family Medicine

## 2013-07-28 ENCOUNTER — Encounter: Payer: Self-pay | Admitting: Family Medicine

## 2013-07-28 ENCOUNTER — Telehealth: Payer: Self-pay | Admitting: Family Medicine

## 2013-07-28 VITALS — BP 137/71 | HR 91 | Temp 97.2°F | Resp 20 | Ht 63.0 in | Wt 173.0 lb

## 2013-07-28 DIAGNOSIS — N179 Acute kidney failure, unspecified: Secondary | ICD-10-CM

## 2013-07-28 DIAGNOSIS — J9600 Acute respiratory failure, unspecified whether with hypoxia or hypercapnia: Secondary | ICD-10-CM | POA: Diagnosis not present

## 2013-07-28 DIAGNOSIS — J45901 Unspecified asthma with (acute) exacerbation: Secondary | ICD-10-CM | POA: Diagnosis not present

## 2013-07-28 DIAGNOSIS — I10 Essential (primary) hypertension: Secondary | ICD-10-CM

## 2013-07-28 DIAGNOSIS — G4700 Insomnia, unspecified: Secondary | ICD-10-CM

## 2013-07-28 DIAGNOSIS — IMO0001 Reserved for inherently not codable concepts without codable children: Secondary | ICD-10-CM

## 2013-07-28 DIAGNOSIS — M797 Fibromyalgia: Secondary | ICD-10-CM

## 2013-07-28 DIAGNOSIS — M2550 Pain in unspecified joint: Secondary | ICD-10-CM

## 2013-07-28 LAB — BASIC METABOLIC PANEL
Chloride: 104 mEq/L (ref 96–112)
Potassium: 4.4 mEq/L (ref 3.5–5.1)

## 2013-07-28 MED ORDER — OXYCODONE-ACETAMINOPHEN 5-325 MG PO TABS: ORAL_TABLET | ORAL | Status: DC

## 2013-07-28 NOTE — Telephone Encounter (Signed)
Patient Information:  Caller Name: [name]  Phone: [phone]  Patient: Linda [name], Linda [name]  Gender: Female  DOB: 03/18/1945  Age: 67 Years  PCP: Earley Favor [place])  Office Follow Up:  Does the office need to follow up with this patient?: Yes  Instructions For The Office: Daughter Babette Relic requesting callback from Dr. Milinda Cave regarding Beth's history of addiction to Oxycontin, Ambien and Xanax. Requesting NO narcotic pain meds and No Ambien be ordered for Beth. Asking is there a way to prevent pharmacies from filling prescriptions for narcotics that  Beth might receive from other physicians?   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  Waynetta Sandy was discharged from [facility] on 07/27/13- had episode of congestive heart failure. States Mother has had addiction to Oxycontin, Ambien, Xanax.  Husband and son have been in to office to discuss oversedation and over use of controlled medicines. Has had episodes of overdose and was unconscious- required hospitalization in the past.  Was seen in office on 07/28/13 for hospital follow up. Was given prescription for Oxycontin.  Family requesting that all narcotic pain medications and Ambien not be ordered for [facility] and notation be added to chart. Asking is there a way to prevent pharmacies from filling prescriptions that Beth might receive from other physicians?  REQUESTING CALLBACK FROM DR. MCGOWEN  to [name] at [phone]  Reviewed Health History In EMR: Yes  Reviewed Medications In EMR: Yes  Reviewed Allergies In EMR: Yes  Reviewed Surgeries / Procedures: Yes  Date of Onset of Symptoms: Unknown  Guideline(s) Used:  No Protocol Available - Sick Adult  Disposition Per Guideline:   Callback by PCP or Subspecialist within 1 Hour  Reason For Disposition Reached:   Nursing judgment  Advice Given:  Call Back If:  You become worse.  Patient Will Follow Care Advice:  YES

## 2013-07-28 NOTE — Assessment & Plan Note (Signed)
Resolving, s/p hospitalization and systemic steroids.  Continue q6h albuterol nebs prn, keep f/u appt with pulmonology.

## 2013-07-28 NOTE — Assessment & Plan Note (Signed)
Resolved with steroids, abx, diuretics, and oxygen support in hosp. Myocardial ischemia ruled out by nuclear stress testing in hosp.  CT angio neg for PE.

## 2013-07-28 NOTE — Progress Notes (Signed)
OFFICE NOTE  07/29/2013  CC:  Chief Complaint  Patient presents with  . Hospitalization Follow-up  . Shortness of Breath  . Asthma     HPI: Patient is a 67 y.o. Caucasian female who is here with her sister in law for hosp f/u. Was admitted x 2 since last o/v about 3 wks ago.  I reviewed d/c summaries, labs, imaging, notes from these admissions prior to or during today's visit.  Basic dx was acute asthma exacerbation on first admission and same dx on second admission plus new lung infiltrate that was observed w/out further abx treatment. Breathing feels fine now.  She talks a lot of her pain today---says she was told "the top half" of her body required narcotic pain med to help the pain and the "lower half" required "the other kind" of pain med.  Was d/c'd home on percocet, says she was told to take it 1 tab q4h for HA or chest pains. We have tried to cut out narcotics altogether in the recent past due to pt's suspected hx of overuse of them leading to hypersomnolence, possibly acute hypotension/syncope. As of May this year I told her I was not prescribing any further narcotics for her pain.  She says her pain since being d/c'd home on percocet has been much improved and she asks for reconsideration of treatment of her pain with narcotic pain med on an ongoing basis.    She was d/c'd from most recent hospitalization on lasix 20mg  qd due to question of possible contribution of mild diastolic CHF contributing to her dyspnea--she is due for f/u BMET today.  Pertinent PMH:  Asthma HTN, labile Fibromyalgia Hx of polypharmacy Unclear history of psychotropic med abuse per family Hx of iron def anemia CAD Episodic HA syndrome; mostly tension HAs. Depression and anxiety  MEDS:  Outpatient Prescriptions Prior to Visit  Medication Sig Dispense Refill  . albuterol (PROVENTIL HFA;VENTOLIN HFA) 108 (90 BASE) MCG/ACT inhaler Inhale 2 puffs into the lungs every 6 (six) hours as needed for wheezing.  For shortness of breath  1 Inhaler  2  . albuterol (PROVENTIL) (5 MG/ML) 0.5% nebulizer solution Take 0.5 mLs (2.5 mg total) by nebulization every 4 (four) hours as needed for wheezing or shortness of breath.  20 mL  3  . ALPRAZolam (XANAX) 1 MG tablet Take 0.5-1 tablets (0.5-1 mg total) by mouth 3 (three) times daily. 1mg  in a.m. And noon and 0.5 mg in pm  30 tablet  0  . aspirin EC 81 MG EC tablet Take 1 tablet (81 mg total) by mouth daily.  30 tablet  3  . atorvastatin (LIPITOR) 20 MG tablet Take 1 tablet (20 mg total) by mouth daily at 6 PM.  30 tablet  6  . clopidogrel (PLAVIX) 75 MG tablet Take 1 tablet (75 mg total) by mouth daily with breakfast.  30 tablet  3  . furosemide (LASIX) 20 MG tablet Take 1 tablet (20 mg total) by mouth daily.  30 tablet  1  . meloxicam (MOBIC) 7.5 MG tablet Take 7.5 mg by mouth 2 (two) times daily.      . Milnacipran (SAVELLA) 50 MG TABS Take 1 tablet (50 mg total) by mouth 2 (two) times daily.  60 tablet  3  . Multiple Vitamins-Minerals (CENTRUM SILVER ADULT 50+ PO) Take 1 tablet by mouth daily.      . nebivolol (BYSTOLIC) 5 MG tablet Take 1 tablet (5 mg total) by mouth daily.  30 tablet  2  . nitroGLYCERIN (NITROSTAT) 0.4 MG SL tablet Place 1 tablet (0.4 mg total) under the tongue every 5 (five) minutes x 3 doses as needed for chest pain.  60 tablet  3  . omeprazole (PRILOSEC) 20 MG capsule Take 1 capsule (20 mg total) by mouth 2 (two) times daily.  60 capsule  0  . potassium chloride (K-DUR) 10 MEQ tablet Take 1 tablet (10 mEq total) by mouth daily.  30 tablet  0  . pregabalin (LYRICA) 300 MG capsule Take 1 capsule (300 mg total) by mouth 2 (two) times daily.  60 capsule  6  . temazepam (RESTORIL) 7.5 MG capsule Take 1 capsule (7.5 mg total) by mouth at bedtime as needed for sleep. DO NOT TAKE WITHIN 5 HOURS OF XANAX  30 capsule  0  . oxyCODONE-acetaminophen (PERCOCET/ROXICET) 5-325 MG per tablet Take 1-2 tablets by mouth every 4 (four) hours as needed.  30  tablet  0  . budesonide-formoterol (SYMBICORT) 160-4.5 MCG/ACT inhaler Inhale 2 puffs into the lungs 2 (two) times daily.  1 Inhaler  12  . famotidine (PEPCID) 20 MG tablet Take 1 tablet (20 mg total) by mouth at bedtime.  30 tablet  2  . isosorbide mononitrate (IMDUR) 30 MG 24 hr tablet Take 1 tablet (30 mg total) by mouth daily.  30 tablet  2   No facility-administered medications prior to visit.   Past surgical, social, and family history reviewed and no changes noted since last office visit.  PE: Blood pressure 137/71, pulse 91, temperature 97.2 F (36.2 C), temperature source Temporal, resp. rate 20, height 5\' 3"  (1.6 m), weight 173 lb (78.472 kg), SpO2 94.00%. Gen: Alert, well appearing.  Patient is oriented to person, place, time, and situation. CV: RRR, no m/r/g.   LUNGS: CTA bilat, nonlabored resps, good aeration in all lung fields. EXT: no clubbing, cyanosis, or edema.    IMPRESSION AND PLAN:  Asthma with acute exacerbation Resolving, s/p hospitalization and systemic steroids.  Continue q6h albuterol nebs prn, keep f/u appt with pulmonology.  HTN (hypertension), benign Currently stable. Continue current meds.  Acute hypoxic respiratory failure Resolved with steroids, abx, diuretics, and oxygen support in hosp. Myocardial ischemia ruled out by nuclear stress testing in hosp.  CT angio neg for PE.   Acute renal failure (ARF) Slight bump up in BUN/Cr in recent hospitalization.  This improved with euvolemia but she was d/c'd home on 20 mg qd of lasix: check BMET today.  Adjust lasix and potassium as appropriate.  Fibromyalgia syndrome Patient reports she has been referred to a rheumatologist but is not able to be seen for an unclear amount of time.   I reluctantly agreed to rx minimal narcotic for her pain UNTIL she is further evaluated by the rheumatolgist:  I made it clear she was to take one 5mg  oxycodone tab bid and may take a max of one additional pill per day for  acute worsening of pain.   Insomnia Very difficult to treat.  Seems to fail all meds except benzos.   I have voiced my concern for her potential for oversedation due to her tendency to overuse these meds in the past.   In recent hospitalization she was continued on her daily xanax regimen plus tranxene was added for hs use. I did not renew any tranxene today.  She may continue her xanax as she is currently taking it (basically one 1mg  tab tid).  Large joint arthralgia of multiple sites Again, she has  fibromyalgia but she wants to continue pursuing possible other rheumatologic conditions.  General rheum labs here in the past were all normal. She mentions the possibility of a rheum referral in the near future (arranged by the hospitalist ?).   An After Visit Summary was printed and given to the patient.   FOLLOW UP: 1 mo

## 2013-07-28 NOTE — Assessment & Plan Note (Signed)
Currently stable. Continue current meds.

## 2013-07-28 NOTE — Assessment & Plan Note (Addendum)
Patient reports she has been referred to a rheumatologist but is not able to be seen for an unclear amount of time.   I reluctantly agreed to rx minimal narcotic for her pain UNTIL she is further evaluated by the rheumatolgist:  I made it clear she was to take one 5mg  oxycodone tab bid and may take a max of one additional pill per day for acute worsening of pain.

## 2013-07-28 NOTE — Assessment & Plan Note (Signed)
Slight bump up in BUN/Cr in recent hospitalization.  This improved with euvolemia but she was d/c'd home on 20 mg qd of lasix: check BMET today.  Adjust lasix and potassium as appropriate.

## 2013-07-29 NOTE — Assessment & Plan Note (Signed)
Very difficult to treat.  Seems to fail all meds except benzos.   I have voiced my concern for her potential for oversedation due to her tendency to overuse these meds in the past.   In recent hospitalization she was continued on her daily xanax regimen plus tranxene was added for hs use. I did not renew any tranxene today.  She may continue her xanax as she is currently taking it (basically one 1mg  tab tid).

## 2013-07-29 NOTE — Telephone Encounter (Signed)
Spoke to patient's daughter, Babette Relic and explained that we needed a copy of the power of attorney in order to discuss any information with her.  She will fax [place] a copy of the power of attorney so Dr. Milinda Cave can speak to her regarding patient.

## 2013-07-29 NOTE — Assessment & Plan Note (Signed)
Again, she has fibromyalgia but she wants to continue pursuing possible other rheumatologic conditions.  General rheum labs here in the past were all normal. She mentions the possibility of a rheum referral in the near future (arranged by the hospitalist ?).

## 2013-07-30 ENCOUNTER — Other Ambulatory Visit: Payer: Self-pay | Admitting: Family Medicine

## 2013-07-30 MED ORDER — ALBUTEROL SULFATE (5 MG/ML) 0.5% IN NEBU: 2.5000 mg | INHALATION_SOLUTION | RESPIRATORY_TRACT | Status: DC | PRN

## 2013-07-30 MED ORDER — MILNACIPRAN HCL 50 MG PO TABS: 50.0000 mg | ORAL_TABLET | Freq: Two times a day (BID) | ORAL | Status: DC

## 2013-08-03 ENCOUNTER — Telehealth: Payer: Self-pay | Admitting: Family Medicine

## 2013-08-03 DIAGNOSIS — J449 Chronic obstructive pulmonary disease, unspecified: Secondary | ICD-10-CM | POA: Diagnosis not present

## 2013-08-03 DIAGNOSIS — I5030 Unspecified diastolic (congestive) heart failure: Secondary | ICD-10-CM | POA: Diagnosis not present

## 2013-08-03 DIAGNOSIS — I10 Essential (primary) hypertension: Secondary | ICD-10-CM | POA: Diagnosis not present

## 2013-08-03 DIAGNOSIS — M1990 Unspecified osteoarthritis, unspecified site: Secondary | ICD-10-CM | POA: Diagnosis not present

## 2013-08-03 DIAGNOSIS — G8929 Other chronic pain: Secondary | ICD-10-CM | POA: Diagnosis not present

## 2013-08-03 DIAGNOSIS — M169 Osteoarthritis of hip, unspecified: Secondary | ICD-10-CM | POA: Diagnosis not present

## 2013-08-03 DIAGNOSIS — J9819 Other pulmonary collapse: Secondary | ICD-10-CM | POA: Diagnosis present

## 2013-08-03 DIAGNOSIS — E873 Alkalosis: Secondary | ICD-10-CM | POA: Diagnosis not present

## 2013-08-03 DIAGNOSIS — Z7982 Long term (current) use of aspirin: Secondary | ICD-10-CM | POA: Diagnosis not present

## 2013-08-03 DIAGNOSIS — I509 Heart failure, unspecified: Secondary | ICD-10-CM | POA: Diagnosis not present

## 2013-08-03 DIAGNOSIS — I2510 Atherosclerotic heart disease of native coronary artery without angina pectoris: Secondary | ICD-10-CM | POA: Diagnosis not present

## 2013-08-03 DIAGNOSIS — I5032 Chronic diastolic (congestive) heart failure: Secondary | ICD-10-CM | POA: Diagnosis not present

## 2013-08-03 DIAGNOSIS — R0602 Shortness of breath: Secondary | ICD-10-CM | POA: Diagnosis not present

## 2013-08-03 DIAGNOSIS — I369 Nonrheumatic tricuspid valve disorder, unspecified: Secondary | ICD-10-CM | POA: Diagnosis present

## 2013-08-03 DIAGNOSIS — I498 Other specified cardiac arrhythmias: Secondary | ICD-10-CM | POA: Diagnosis not present

## 2013-08-03 DIAGNOSIS — I059 Rheumatic mitral valve disease, unspecified: Secondary | ICD-10-CM | POA: Diagnosis present

## 2013-08-03 DIAGNOSIS — R0609 Other forms of dyspnea: Secondary | ICD-10-CM | POA: Diagnosis not present

## 2013-08-03 DIAGNOSIS — M1710 Unilateral primary osteoarthritis, unspecified knee: Secondary | ICD-10-CM | POA: Diagnosis not present

## 2013-08-03 DIAGNOSIS — I2699 Other pulmonary embolism without acute cor pulmonale: Secondary | ICD-10-CM | POA: Diagnosis not present

## 2013-08-03 DIAGNOSIS — Z79899 Other long term (current) drug therapy: Secondary | ICD-10-CM | POA: Diagnosis not present

## 2013-08-03 DIAGNOSIS — J9600 Acute respiratory failure, unspecified whether with hypoxia or hypercapnia: Secondary | ICD-10-CM | POA: Diagnosis not present

## 2013-08-03 DIAGNOSIS — IMO0002 Reserved for concepts with insufficient information to code with codable children: Secondary | ICD-10-CM | POA: Diagnosis present

## 2013-08-03 DIAGNOSIS — M129 Arthropathy, unspecified: Secondary | ICD-10-CM | POA: Diagnosis present

## 2013-08-03 DIAGNOSIS — I951 Orthostatic hypotension: Secondary | ICD-10-CM | POA: Diagnosis not present

## 2013-08-03 DIAGNOSIS — J45901 Unspecified asthma with (acute) exacerbation: Secondary | ICD-10-CM | POA: Diagnosis not present

## 2013-08-03 DIAGNOSIS — K5900 Constipation, unspecified: Secondary | ICD-10-CM | POA: Diagnosis not present

## 2013-08-03 DIAGNOSIS — IMO0001 Reserved for inherently not codable concepts without codable children: Secondary | ICD-10-CM | POA: Diagnosis not present

## 2013-08-03 DIAGNOSIS — J61 Pneumoconiosis due to asbestos and other mineral fibers: Secondary | ICD-10-CM | POA: Diagnosis present

## 2013-08-03 DIAGNOSIS — R091 Pleurisy: Secondary | ICD-10-CM | POA: Diagnosis not present

## 2013-08-03 DIAGNOSIS — E785 Hyperlipidemia, unspecified: Secondary | ICD-10-CM | POA: Diagnosis not present

## 2013-08-03 NOTE — Telephone Encounter (Signed)
Call came in on emergency situation - patient with chest pain - declining to call 911. Connecting to call found caller had hung up. Attempted 3 times to call Linda [name] back - kept getting phone with answering message to leave message and she would call back.  Left 2 messages since unable to reach her about this situation.

## 2013-08-03 NOTE — Telephone Encounter (Signed)
Patient Information:  Caller Name: [facility]  Phone: [phone]  Patient: Linda, [name]  Gender: Female  DOB: [birth year]  Age: 67 Years  PCP: Earley Favor [facility])  Office Follow Up:  Does the office need to follow up with this patient?: Yes  Instructions For The Office: Call Kim/PT re: verbal order for Social Worker to visit Call Lanora Manis re: needing to increase Lasix, restart Symbicort   Symptoms  Reason For Call & Symptoms: PT/Kim calling about Astryd having chest pain/ heaviness and pressure-#6 on 1-10 scale. Took 3 NTG 9-10 min apart which helped reduce pain level to #0. She has headache and is short of breath with exersion. BP= 140/80. HR= 90 Regular, O2 Sat= 97% on R.A. Resp= 16. Lips pink, occasional dry cough.  Hospitalized in July 21- Aug 4th. Seen by Cardiologist in hospital and has not yet had ER f/u appointment in the office. Wants to come in on Thursday when husband is coming in. Lasix 20 mgs 2 PO yesterday but only one tab today. 2.5 weight gain since yesterday. She is refusing to go back to ED-She is under a lot stress with husband recently having surgery.   Reviewed Health History In EMR: Yes  Reviewed Medications In EMR: Yes  Reviewed Allergies In EMR: Yes  Reviewed Surgeries / Procedures: Yes  Date of Onset of Symptoms: 08/03/2013  Treatments Tried: NTG x3  Treatments Tried Worked: Yes  Guideline(s) Used:  Chest Pain  Disposition Per Guideline:   See Today in Office  Reason For Disposition Reached:   All other patients with chest pain  Advice Given:  Fleeting Chest Pain:  Fleeting chest pains that last only a few seconds and then go away are generally not serious. They may be from pinched muscles or nerves in your chest wall.  Call Back If:  Severe chest pain  Difficulty breathing  You become worse.  Patient Refused Recommendation:  Patient Refused Appt, Patient Requests Appt At Later Date  Would like call from  PCP re: needing to increase Lasix and restart on Symbicort. Would like to come in on Thurs for f/u appointment at same time as husband. PT- Kim requesting referral Sinda Du order for Ecologist.

## 2013-08-03 NOTE — Telephone Encounter (Signed)
Spoke to patient and her home health nurse.  Patient says she feels fine now, she just over exerted herself.  Her home health nurse gave her three nitroglycerin tabs and her pain has gone down.  Patient did schedule an appointment with Dr Milinda Cave on Thursday at 11:30am.

## 2013-08-04 ENCOUNTER — Ambulatory Visit: Payer: Medicare Other | Admitting: Family Medicine

## 2013-08-04 DIAGNOSIS — J9600 Acute respiratory failure, unspecified whether with hypoxia or hypercapnia: Secondary | ICD-10-CM

## 2013-08-04 DIAGNOSIS — J45901 Unspecified asthma with (acute) exacerbation: Secondary | ICD-10-CM

## 2013-08-04 DIAGNOSIS — J61 Pneumoconiosis due to asbestos and other mineral fibers: Secondary | ICD-10-CM

## 2013-08-04 DIAGNOSIS — I2699 Other pulmonary embolism without acute cor pulmonale: Secondary | ICD-10-CM | POA: Insufficient documentation

## 2013-08-04 DIAGNOSIS — IMO0001 Reserved for inherently not codable concepts without codable children: Secondary | ICD-10-CM | POA: Diagnosis not present

## 2013-08-05 ENCOUNTER — Ambulatory Visit: Payer: Medicare Other | Admitting: Family Medicine

## 2013-08-06 ENCOUNTER — Inpatient Hospital Stay: Payer: Medicare Other | Admitting: Internal Medicine

## 2013-08-06 DIAGNOSIS — J9811 Atelectasis: Secondary | ICD-10-CM | POA: Insufficient documentation

## 2013-08-09 DIAGNOSIS — I951 Orthostatic hypotension: Secondary | ICD-10-CM | POA: Insufficient documentation

## 2013-08-12 ENCOUNTER — Telehealth: Payer: Self-pay | Admitting: Family Medicine

## 2013-08-12 ENCOUNTER — Ambulatory Visit: Payer: Medicare Other | Admitting: Family Medicine

## 2013-08-12 NOTE — Telephone Encounter (Signed)
Yes, that's fine 

## 2013-08-12 NOTE — Telephone Encounter (Signed)
Left detailed message for Darl Pikes at [facility] letting her know.

## 2013-08-12 NOTE — Telephone Encounter (Signed)
Please advise 

## 2013-08-13 ENCOUNTER — Telehealth: Payer: Self-pay | Admitting: Internal Medicine

## 2013-08-13 ENCOUNTER — Encounter: Payer: Self-pay | Admitting: Family Medicine

## 2013-08-13 ENCOUNTER — Ambulatory Visit (INDEPENDENT_AMBULATORY_CARE_PROVIDER_SITE_OTHER): Payer: Medicare Other | Admitting: Family Medicine

## 2013-08-13 ENCOUNTER — Ambulatory Visit (INDEPENDENT_AMBULATORY_CARE_PROVIDER_SITE_OTHER)
Admission: RE | Admit: 2013-08-13 | Discharge: 2013-08-13 | Disposition: A | Discharge status: Home / Self Care | Payer: Medicare Other | Source: Ambulatory Visit | Attending: Family Medicine | Admitting: Family Medicine

## 2013-08-13 VITALS — BP 126/76 | HR 109 | Temp 96.7°F | Resp 22 | Ht 63.0 in | Wt 176.0 lb

## 2013-08-13 DIAGNOSIS — R091 Pleurisy: Secondary | ICD-10-CM | POA: Diagnosis not present

## 2013-08-13 DIAGNOSIS — I5031 Acute diastolic (congestive) heart failure: Secondary | ICD-10-CM

## 2013-08-13 DIAGNOSIS — I2699 Other pulmonary embolism without acute cor pulmonale: Secondary | ICD-10-CM | POA: Diagnosis not present

## 2013-08-13 DIAGNOSIS — E876 Hypokalemia: Secondary | ICD-10-CM

## 2013-08-13 DIAGNOSIS — J441 Chronic obstructive pulmonary disease with (acute) exacerbation: Secondary | ICD-10-CM

## 2013-08-13 DIAGNOSIS — I10 Essential (primary) hypertension: Secondary | ICD-10-CM

## 2013-08-13 DIAGNOSIS — R0609 Other forms of dyspnea: Secondary | ICD-10-CM

## 2013-08-13 DIAGNOSIS — Z79899 Other long term (current) drug therapy: Secondary | ICD-10-CM

## 2013-08-13 DIAGNOSIS — D509 Iron deficiency anemia, unspecified: Secondary | ICD-10-CM

## 2013-08-13 DIAGNOSIS — I509 Heart failure, unspecified: Secondary | ICD-10-CM

## 2013-08-13 LAB — COMPREHENSIVE METABOLIC PANEL
Albumin: 3.8 g/dL (ref 3.5–5.2)
Alkaline Phosphatase: 49 U/L (ref 39–117)
BUN: 25 mg/dL — ABNORMAL HIGH (ref 6–23)
Glucose, Bld: 152 mg/dL — ABNORMAL HIGH (ref 70–99)
Total Bilirubin: 0.4 mg/dL (ref 0.3–1.2)

## 2013-08-13 LAB — CBC WITH DIFFERENTIAL/PLATELET
Basophils Relative: 0.2 % (ref 0.0–3.0)
Eosinophils Relative: 0.7 % (ref 0.0–5.0)
HCT: 32.2 % — ABNORMAL LOW (ref 36.0–46.0)
Lymphs Abs: 1.1 10*3/uL (ref 0.7–4.0)
MCV: 87.9 fl (ref 78.0–100.0)
Monocytes Absolute: 0.3 10*3/uL (ref 0.1–1.0)
RBC: 3.66 Mil/uL — ABNORMAL LOW (ref 3.87–5.11)
WBC: 9.6 10*3/uL (ref 4.5–10.5)

## 2013-08-13 LAB — PROTIME-INR
INR: 3.8 ratio — ABNORMAL HIGH (ref 0.8–1.0)
Prothrombin Time: 38.7 s — ABNORMAL HIGH (ref 10.2–12.4)

## 2013-08-13 MED ORDER — TIOTROPIUM BROMIDE MONOHYDRATE 18 MCG IN CAPS: 18.0000 ug | ORAL_CAPSULE | Freq: Every day | RESPIRATORY_TRACT | Status: DC

## 2013-08-13 MED ORDER — PREDNISONE 20 MG PO TABS: ORAL_TABLET | ORAL | Status: DC

## 2013-08-13 NOTE — Assessment & Plan Note (Addendum)
Suspect she has a mixture of asbestos-related lung dz, asthma, and COPD. No sign of CHF at this time. Will further eval with CXR today as well as repeat her CBC and CMET. I have also chosen to stop her symbicort--thinking that maybe the inhaled steroid is causing some of her hoarseness.  I gave her #20 samples of spireva to try 1 qd instead. Continue duoneb q4-6 hours. I increased her prednisone: 60mg  qd x 5d, then 40mg  qd x 5d, then 20 mg qd x 5d. She has tentative pulm f/u early next week with [facility].

## 2013-08-13 NOTE — Assessment & Plan Note (Signed)
This is a big problem for her. She now reveals that she has never been on plavix, although each visit she has verified with me and my nurse that she is taking it. Will d/c plavix from med list.  She'll continue ASA 81 mg qd with her coumadin. Stop meloxicam. She is to take only 5 mg zolpidem qhs.  She is to take her xanax only as prescribed. She is NOT to take ANY narcotic pain meds anymore b/c she cannot be trusted to take them correctly.

## 2013-08-13 NOTE — Progress Notes (Signed)
OFFICE VISIT  08/13/2013   CC:  Chief Complaint  Patient presents with  . Hospitalization Follow-up    [facility].  In on Tuesday out the following Monday  . Medication Refill     HPI:    Patient is a 67 y.o. Caucasian female who presents for 4 day hosp f/u for acute hypoxic resp failure secondary to acute asthma exacerbation and new dx of pulmonary emboli ([facility]--d/c summary sent to me and reviewed already).  She was d/c'd home from hospital w/out oxygen.  She is on coumadin.  Denies any bleeding/excessive bruising.  Still moderately SOB:  Last duoneb was about 2 hours ago--feels like she gets mild improvement.   Sounds like, from her description of things, she was discharged from [facility] before the medical team felt really comfortable, but she was off of oxygen and she promised them she would do only the minimal home activity and always use her walker and do frequent nebs.  She says she still always feels SOB at rest and gets worse with just slow walking.  No chest pains. Complains of swelling in trunk, neck, and face but not in legs/feet.  +Still lots of wheezing per pt but none currently.  Still very hoarse.  No fevers.  No n/v/abd pains. Chest feels tight in central region, still has frequent nonproductive cough.  Has some lightheadedness with going from supine to upright and from sitting up to standing but much milder and less frequent than when in the hospital recently.  She says she was d/c'd home on prednisone 20mg  qd dosing.  Pertinent PMH: Asthma  HTN, labile  Fibromyalgia  Hx of polypharmacy  Unclear history of psychotropic med abuse per family  Hx of iron def anemia  CAD--nonobstructive Diastolic CHF Episodic HA syndrome; mostly tension HAs.  Depression and anxiety Pleural plaques c/w asbestos exposure Hx of pneumonia BPPV Insomnia Physical deconditioning Polyarthralgias Chronic LBP/DDD Cervical DDD  Pertinent SH: her husband is currently dying from severe/extensive  malignant mesothelioma  Past Surgical History  Procedure Laterality Date  . Appendectomy  1960  . Total abdominal hysterectomy  1974  . Tendon release  1996    Right forearm and hand  . Knee surgery  2005  . Heel spur surgery Left 2008  . Plantar fascia release Left 2008  . Axillary surgery Left 1978    Multiple "lump" in armpit per pt  . Coccyx removal  1972  . Cardiac catheterization  01/2013    nonobstructive CAD, EF 55-60%  . Transthoracic echocardiogram  01/2013    NORMAL  . Dilation and curettage of uterus  ? 1970's  . Eye surgery Left 2012-2013    "injections for ~ 1 yr; don't really know what for" (07/12/2013)    Outpatient Prescriptions Prior to Visit  Medication Sig Dispense Refill  . albuterol (PROVENTIL HFA;VENTOLIN HFA) 108 (90 BASE) MCG/ACT inhaler Inhale 2 puffs into the lungs every 6 (six) hours as needed for wheezing. For shortness of breath  1 Inhaler  2  . albuterol (PROVENTIL) (5 MG/ML) 0.5% nebulizer solution Take 0.5 mLs (2.5 mg total) by nebulization every 4 (four) hours as needed for wheezing or shortness of breath.  20 mL  3  . ALPRAZolam (XANAX) 1 MG tablet Take 0.5-1 tablets (0.5-1 mg total) by mouth 3 (three) times daily. 1mg  in a.m. And noon and 0.5 mg in pm  30 tablet  0  . aspirin EC 81 MG EC tablet Take 1 tablet (81 mg total) by mouth daily.  30 tablet  3  . atorvastatin (LIPITOR) 20 MG tablet Take 1 tablet (20 mg total) by mouth daily at 6 PM.  30 tablet  6  . budesonide-formoterol (SYMBICORT) 160-4.5 MCG/ACT inhaler Inhale 2 puffs into the lungs 2 (two) times daily.  1 Inhaler  12  . furosemide (LASIX) 20 MG tablet Take 1 tablet (20 mg total) by mouth daily.  30 tablet  1  . isosorbide mononitrate (IMDUR) 30 MG 24 hr tablet Take 1 tablet (30 mg total) by mouth daily.  30 tablet  2  . Milnacipran (SAVELLA) 50 MG TABS tablet Take 1 tablet (50 mg total) by mouth 2 (two) times daily.  60 tablet  3  . Multiple Vitamins-Minerals (CENTRUM SILVER ADULT 50+ PO)  Take 1 tablet by mouth daily.      . nebivolol (BYSTOLIC) 5 MG tablet Take 1 tablet (5 mg total) by mouth daily.  30 tablet  2  . nitroGLYCERIN (NITROSTAT) 0.4 MG SL tablet Place 1 tablet (0.4 mg total) under the tongue every 5 (five) minutes x 3 doses as needed for chest pain.  60 tablet  3  . omeprazole (PRILOSEC) 20 MG capsule Take 1 capsule (20 mg total) by mouth 2 (two) times daily.  60 capsule  0  . pregabalin (LYRICA) 300 MG capsule Take 1 capsule (300 mg total) by mouth 2 (two) times daily.  60 capsule  6  . temazepam (RESTORIL) 7.5 MG capsule Take 1 capsule (7.5 mg total) by mouth at bedtime as needed for sleep. DO NOT TAKE WITHIN 5 HOURS OF XANAX  30 capsule  0  . meloxicam (MOBIC) 7.5 MG tablet Take 7.5 mg by mouth 2 (two) times daily.      [name] Kitchen oxyCODONE-acetaminophen (PERCOCET/ROXICET) 5-325 MG per tablet 1 tab po twice per day AS NEEDED for pain, may take one additional tab per day PRN severe pain  70 tablet  0  . potassium chloride (K-DUR) 10 MEQ tablet Take 1 tablet (10 mEq total) by mouth daily.  30 tablet  0  . clopidogrel (PLAVIX) 75 MG tablet Take 1 tablet (75 mg total) by mouth daily with breakfast.  30 tablet  3  . famotidine (PEPCID) 20 MG tablet Take 1 tablet (20 mg total) by mouth at bedtime.  30 tablet  2   No facility-administered medications prior to visit.    Allergies  Allergen Reactions  . Penicillins Itching, Swelling and Rash    ROS As per HPI  PE: Blood pressure 126/76, pulse 109, temperature 96.7 F (35.9 C), temperature source Temporal, resp. rate 22, height 5\' 3"  (1.6 m), weight 176 lb (79.833 kg), SpO2 95.00%. Gen: alert, tired appearing, mild resp distress.  Oriented x 4.  Using a walker. No cyanosis.  Face: mild diffuse swelling (not lips). Oral cavity pink/moist.  Throat w/out erythema or swelling. Neck: supple, no LAD. Lungs: RR about 45-50, she has a mildly labored appearance to her breathing, she has trace end inspiratory soft crackles in both  bases and in left anterior lung field.  Aeration is pretty good.  There is no wheezing but expiratory phase is prolonged. Abd: soft, NT/ND EXT: no clubbing, cyanosis, or edema.   LABS:  None today  INR's from [facility]: First day she was in therapeutic range was 08/07/13--INR 2.16 Most recent INR was 08/09/13--INR 2.22 (As far as I can tell her dosing is 5mg  qd)  IMAGING FROM [facility] hospitalization recently: CT angio--1. Tiny subsegmental pulmonary embolism involving a left upper  lobe pulmonary artery. 2. Calcified pleural-based plaques along the surface of both lungs, consistent with asbestos-related pleural disease. Given the Bulk soft tissue associated with these plaques ongoing imaging surveillance is recommended. I see no significant imaging evidence of asbestosis at this time. Airspace disease in the dependent aspect of both lower lobes may simply reflect atelectasis. Notably, prone CT imaging of the chest could better characterize and possibly identify changes of asbestosis if clinically relevant. 3. Coronary artery disease.   IMPRESSION AND PLAN:  Asthma, chronic obstructive, with acute exacerbation Suspect she has a mixture of asbestos-related lung dz, asthma, and COPD. No sign of CHF at this time. Will further eval with CXR today as well as repeat her CBC and CMET. I have also chosen to stop her symbicort--thinking that maybe the inhaled steroid is causing some of her hoarseness.  I gave her #20 samples of spireva to try 1 qd instead. Continue duoneb q4-6 hours. I increased her prednisone: 60mg  qd x 5d, then 40mg  qd x 5d, then 20 mg qd x 5d. She has tentative pulm f/u early next week with [facility].  Acute pulmonary embolism Fortunately this is small.  She has been on coumadin for about 11-12 days now. Plan on 6 mo course of coumadin. Last INR was 08/09/13 and was 2.22--presumably on 5mg  qd coumadin. Recheck PT/INR today.  If still therapeutic will recheck again in 1 wk.  HTN  (hypertension), benign BP normal on current meds. Orthostatic sx's resolving.  Polypharmacy This is a big problem for her. She now reveals that she has never been on plavix, although each visit she has verified with me and my nurse that she is taking it. Will d/c plavix from med list.  She'll continue ASA 81 mg qd with her coumadin. Stop meloxicam. She is to take only 5 mg zolpidem qhs.  She is to take her xanax only as prescribed. She is NOT to take ANY narcotic pain meds anymore b/c she cannot be trusted to take them correctly.   An After Visit Summary was printed and given to the patient.  FOLLOW UP: Return in about 1 week (around 08/20/2013) for f/u pulm issues.

## 2013-08-13 NOTE — Telephone Encounter (Signed)
I spoke with pt. She stated she thought her appt was going to be with [facility] on Monday since this is who her husband see's. I advised her [facility] has no available openings and it looks like the hospital is the one who scheduled this appt. She voiced her understanding and will keep appt. She needed nothing further

## 2013-08-13 NOTE — Assessment & Plan Note (Signed)
BP normal on current meds. Orthostatic sx's resolving.

## 2013-08-13 NOTE — Assessment & Plan Note (Addendum)
Fortunately this is small.  She has been on coumadin for about 11-12 days now. Plan on 6 mo course of coumadin. Last INR was 08/09/13 and was 2.22--presumably on 5mg  qd coumadin. Recheck PT/INR today.  If still therapeutic will recheck again in 1 wk.

## 2013-08-16 ENCOUNTER — Encounter: Payer: Self-pay | Admitting: Internal Medicine

## 2013-08-16 ENCOUNTER — Other Ambulatory Visit: Payer: Self-pay | Admitting: Hematology & Oncology

## 2013-08-16 ENCOUNTER — Ambulatory Visit (INDEPENDENT_AMBULATORY_CARE_PROVIDER_SITE_OTHER): Payer: Medicare Other | Admitting: Internal Medicine

## 2013-08-16 ENCOUNTER — Telehealth: Payer: Self-pay | Admitting: Hematology & Oncology

## 2013-08-16 VITALS — BP 158/70 | HR 110 | Temp 97.2°F | Ht 64.0 in | Wt 179.8 lb

## 2013-08-16 DIAGNOSIS — T65891A Toxic effect of other specified substances, accidental (unintentional), initial encounter: Secondary | ICD-10-CM | POA: Diagnosis not present

## 2013-08-16 DIAGNOSIS — J920 Pleural plaque with presence of asbestos: Secondary | ICD-10-CM

## 2013-08-16 DIAGNOSIS — I2699 Other pulmonary embolism without acute cor pulmonale: Secondary | ICD-10-CM | POA: Diagnosis not present

## 2013-08-16 DIAGNOSIS — R091 Pleurisy: Secondary | ICD-10-CM

## 2013-08-16 DIAGNOSIS — R0609 Other forms of dyspnea: Secondary | ICD-10-CM

## 2013-08-16 MED ORDER — TRAMADOL HCL 50 MG PO TABS: ORAL_TABLET | ORAL | Status: DC

## 2013-08-16 NOTE — Telephone Encounter (Signed)
Pt called wanted to make appointment. Dr. Myna Hidalgo reviewed chart. Pt aware of 8-29 appointment

## 2013-08-16 NOTE — Progress Notes (Signed)
Subjective:    Patient ID: Linda [name], female    DOB: 17-Aug-1946   MRN: [id number]  HPI  39 yowf never smoker dx'd with "asbestosis and asthma" with variable symptoms dating back to around 2004 in [place] initially treated with symbicort and alb several times a week with baseline doe x one aisle at grocery store and  much worse p ran out of symbicort around 01/2013 but no better when added it back.   08/16/2013 f/u ov/Julien Berryman  Chief Complaint  Patient presents with  . Pulmonary Consult    Referred per Danley Danker, PA at [facility]. Pt. c/o SOB, CP, and cough for the past 6 months.   breathing is fine at rest unless bends over, cough is day > night dry and coarse, can cough so hard starts to hurt in midline with coughing.   No obvious daytime variabilty or assoc   cp or chest tightness, subjective wheeze overt sinus or hb symptoms. No unusual exp hx or h/o childhood pna/ asthma or knowledge of premature birth.   Sleeping ok at 30 degrees s 02  without nocturnal  or early am exacerbation  of respiratory  c/o's or need for noct saba. Also denies any obvious fluctuation of symptoms with weather or environmental changes or other aggravating or alleviating factors except as outlined above   Current Medications, Allergies, Past Medical History, Past Surgical History, Family History, and Social History were reviewed in [employer] record.  ROS  The following are not active complaints unless bolded sore throat, dysphagia, dental problems, itching, sneezing,  nasal congestion or excess/ purulent secretions, ear ache,   fever, chills, sweats, unintended wt loss, pleuritic or exertional cp, hemoptysis,  orthopnea pnd or leg swelling, presyncope, palpitations, heartburn, abdominal pain, anorexia, nausea, vomiting, diarrhea  or change in bowel or urinary habits, change in stools or urine, dysuria,hematuria,  rash, arthralgias, visual complaints, headache, numbness weakness or  ataxia or problems with walking or coordination,  change in mood/affect or memory.         Review of Systems  Constitutional: Negative for fever, chills and unexpected weight change.  HENT: Negative for ear pain, nosebleeds, congestion, sore throat, rhinorrhea, sneezing, trouble swallowing, dental problem, voice change, postnasal drip and sinus pressure.   Eyes: Negative for visual disturbance.  Respiratory: Positive for cough and shortness of breath. Negative for choking.   Cardiovascular: Positive for chest pain and leg swelling.  Gastrointestinal: Negative for vomiting, abdominal pain and diarrhea.  Genitourinary: Negative for difficulty urinating.  Musculoskeletal: Positive for arthralgias.  Skin: Negative for rash.  Neurological: Positive for headaches. Negative for tremors and syncope.  Hematological: Does not bruise/bleed easily.       Objective:   Physical Exam  amb wf with marked voice fatigue, intermittent pseudoweeze Wt Readings from Last 3 Encounters:  08/16/13 179 lb 12.8 oz (81.557 kg)  08/13/13 176 lb (79.833 kg)  07/28/13 173 lb (78.472 kg)    HEENT: nl dentition, turbinates, and orophanx. Nl external ear canals without cough reflex   NECK :  without JVD/Nodes/TM/ nl carotid upstrokes bilaterally   LUNGS: no acc muscle use, clear to A and P bilaterally without cough on insp or exp maneuvers   CV:  RRR  no s3 or murmur or increase in P2, no edema   ABD:  soft and nontender with nl excursion in the supine position. No bruits or organomegaly, bowel sounds nl  MS:  warm without deformities, calf tenderness, cyanosis or clubbing  SKIN: warm and dry without lesions    NEURO:  alert, approp, no deficits    08/13/13 cxr Stable calcified bilateral pleural plaques consistent with asbestos  exposure. No acute cardiopulmonary abnormality seen         Assessment & Plan:

## 2013-08-16 NOTE — Patient Instructions (Addendum)
Prilosec is 20 mg Take 30- 60 min before your first and last meals of the day and pepcid 20 mg at bedtime  GERD (REFLUX)  is an extremely common cause of respiratory symptoms, many times with no significant heartburn at all.    It can be treated with medication, but also with lifestyle changes including avoidance of late meals, excessive alcohol, smoking cessation, and avoid fatty foods, chocolate, peppermint, colas, red wine, and acidic juices such as orange juice.  NO MINT OR MENTHOL PRODUCTS SO NO COUGH DROPS  USE SUGARLESS CANDY INSTEAD (jolley ranchers or Stover's)  NO OIL BASED VITAMINS - use powdered substitutes.    Prednisone 20 mg per day if you breathing and cough drop it to 10 mg daily   Take delsym two tsp every 12 hours and supplement if needed with  tramadol 50 mg up to 2 every 4 hours to suppress the urge to cough. Swallowing water or using ice chips/non mint and menthol containing candies (such as lifesavers or sugarless jolly ranchers) are also effective.  You should rest your voice and avoid activities that you know make you cough.  Once you have eliminated the cough for 3 straight days try reducing the tramadol first,  then the delsym as tolerated.     If desire to get your follow up here:  See [name] NP w/in 2 weeks with all your medications, even over the counter meds, separated in two separate bags, the ones you take no matter what vs the ones you stop once you feel better and take only as needed when you feel you need them.   [name]  will generate for you a new user friendly medication calendar that will put [place] all on the same page re: your medication use.     Without this process, it simply isn't possible to assure that we are providing  your outpatient care  with  the attention to detail we feel you deserve.   If we cannot assure that you're getting that kind of care,  then we cannot manage your problem effectively from this clinic.  Once you have seen [name] and we are  sure that we're all on the same page with your medication use she will arrange follow up with me. Late add PFT's next

## 2013-08-17 NOTE — Assessment & Plan Note (Signed)
-   08/16/2013  Walked RA  1 laps @ 185 ft each stopped due to legs weak, no sob,  no desat or tachycardia  Symptoms are markedly disproportionate to objective findings and not clear this is a lung problem but pt does appear to have difficult airway management issues.  DDX of  difficult airways managment all start with A and  include Adherence, Ace Inhibitors, Acid Reflux, Active Sinus Disease, Alpha 1 Antitripsin deficiency, Anxiety masquerading as Airways dz,  ABPA,  allergy(esp in young), Aspiration (esp in elderly), Adverse effects of DPI,  Active smokers, plus two Bs  = Bronchiectasis and Beta blocker use..and one C= CHF  Adherence is always the initial "prime suspect" and is a multilayered concern that requires a "trust but verify" approach in every patient - starting with knowing how to use medications, especially inhalers, correctly, keeping up with refills and understanding the fundamental difference between maintenance and prns vs those medications only taken for a very short course and then stopped and not refilled.  She is severely struggling with polypharmacy   To keep things simple, I have asked the patient to first separate medicines that are perceived as maintenance, that is to be taken daily "no matter what", from those medicines that are taken on only on an as-needed basis and I have given the patient examples of both, and then return to see our NP to generate a  detailed  medication calendar which should be followed until the next physician sees the patient and updates it.   ? Acid reflux > max rx reviewed  ? Anxiety/ depression/ deconditioning definitely a concern here

## 2013-08-17 NOTE — Assessment & Plan Note (Signed)
Probably of no clinical consequence but related to inactivity which have yet to turn around so will require 6 months rx then regroup

## 2013-08-17 NOTE — Assessment & Plan Note (Signed)
Probably of not consequence at all x for documenting asbestos exposure

## 2013-08-18 DIAGNOSIS — Z880 Allergy status to penicillin: Secondary | ICD-10-CM | POA: Diagnosis not present

## 2013-08-18 DIAGNOSIS — IMO0002 Reserved for concepts with insufficient information to code with codable children: Secondary | ICD-10-CM | POA: Diagnosis present

## 2013-08-18 DIAGNOSIS — G4733 Obstructive sleep apnea (adult) (pediatric): Secondary | ICD-10-CM | POA: Diagnosis not present

## 2013-08-18 DIAGNOSIS — I369 Nonrheumatic tricuspid valve disorder, unspecified: Secondary | ICD-10-CM | POA: Diagnosis not present

## 2013-08-18 DIAGNOSIS — M25559 Pain in unspecified hip: Secondary | ICD-10-CM | POA: Diagnosis not present

## 2013-08-18 DIAGNOSIS — J61 Pneumoconiosis due to asbestos and other mineral fibers: Secondary | ICD-10-CM | POA: Diagnosis present

## 2013-08-18 DIAGNOSIS — M79609 Pain in unspecified limb: Secondary | ICD-10-CM | POA: Diagnosis not present

## 2013-08-18 DIAGNOSIS — R0602 Shortness of breath: Secondary | ICD-10-CM | POA: Diagnosis not present

## 2013-08-18 DIAGNOSIS — M549 Dorsalgia, unspecified: Secondary | ICD-10-CM | POA: Diagnosis not present

## 2013-08-18 DIAGNOSIS — Z7901 Long term (current) use of anticoagulants: Secondary | ICD-10-CM | POA: Diagnosis not present

## 2013-08-18 DIAGNOSIS — I509 Heart failure, unspecified: Secondary | ICD-10-CM | POA: Diagnosis not present

## 2013-08-18 DIAGNOSIS — I4510 Unspecified right bundle-branch block: Secondary | ICD-10-CM | POA: Diagnosis not present

## 2013-08-18 DIAGNOSIS — E872 Acidosis, unspecified: Secondary | ICD-10-CM | POA: Diagnosis not present

## 2013-08-18 DIAGNOSIS — I2699 Other pulmonary embolism without acute cor pulmonale: Secondary | ICD-10-CM | POA: Diagnosis not present

## 2013-08-18 DIAGNOSIS — D72829 Elevated white blood cell count, unspecified: Secondary | ICD-10-CM | POA: Diagnosis present

## 2013-08-18 DIAGNOSIS — J40 Bronchitis, not specified as acute or chronic: Secondary | ICD-10-CM | POA: Diagnosis present

## 2013-08-18 DIAGNOSIS — I5030 Unspecified diastolic (congestive) heart failure: Secondary | ICD-10-CM | POA: Diagnosis not present

## 2013-08-18 DIAGNOSIS — J189 Pneumonia, unspecified organism: Secondary | ICD-10-CM | POA: Diagnosis not present

## 2013-08-18 DIAGNOSIS — R0609 Other forms of dyspnea: Secondary | ICD-10-CM | POA: Diagnosis not present

## 2013-08-18 DIAGNOSIS — R062 Wheezing: Secondary | ICD-10-CM | POA: Diagnosis not present

## 2013-08-18 DIAGNOSIS — M5137 Other intervertebral disc degeneration, lumbosacral region: Secondary | ICD-10-CM | POA: Diagnosis not present

## 2013-08-18 DIAGNOSIS — J209 Acute bronchitis, unspecified: Secondary | ICD-10-CM | POA: Diagnosis not present

## 2013-08-18 DIAGNOSIS — R918 Other nonspecific abnormal finding of lung field: Secondary | ICD-10-CM | POA: Diagnosis not present

## 2013-08-18 DIAGNOSIS — G8929 Other chronic pain: Secondary | ICD-10-CM | POA: Diagnosis not present

## 2013-08-18 DIAGNOSIS — I2510 Atherosclerotic heart disease of native coronary artery without angina pectoris: Secondary | ICD-10-CM | POA: Diagnosis not present

## 2013-08-18 DIAGNOSIS — R509 Fever, unspecified: Secondary | ICD-10-CM | POA: Diagnosis not present

## 2013-08-18 DIAGNOSIS — D649 Anemia, unspecified: Secondary | ICD-10-CM | POA: Diagnosis not present

## 2013-08-18 DIAGNOSIS — J45901 Unspecified asthma with (acute) exacerbation: Secondary | ICD-10-CM | POA: Diagnosis not present

## 2013-08-18 DIAGNOSIS — IMO0001 Reserved for inherently not codable concepts without codable children: Secondary | ICD-10-CM | POA: Diagnosis not present

## 2013-08-18 DIAGNOSIS — Z86711 Personal history of pulmonary embolism: Secondary | ICD-10-CM | POA: Diagnosis not present

## 2013-08-18 DIAGNOSIS — J9600 Acute respiratory failure, unspecified whether with hypoxia or hypercapnia: Secondary | ICD-10-CM | POA: Diagnosis not present

## 2013-08-18 DIAGNOSIS — M4310 Spondylolisthesis, site unspecified: Secondary | ICD-10-CM | POA: Diagnosis not present

## 2013-08-18 DIAGNOSIS — I10 Essential (primary) hypertension: Secondary | ICD-10-CM | POA: Diagnosis present

## 2013-08-18 DIAGNOSIS — R791 Abnormal coagulation profile: Secondary | ICD-10-CM | POA: Diagnosis present

## 2013-08-19 ENCOUNTER — Ambulatory Visit: Payer: Medicare Other | Admitting: Family Medicine

## 2013-08-20 ENCOUNTER — Ambulatory Visit: Payer: Medicare Other | Admitting: Hematology & Oncology

## 2013-08-20 ENCOUNTER — Ambulatory Visit: Payer: Medicare Other | Admitting: Family Medicine

## 2013-08-20 ENCOUNTER — Other Ambulatory Visit: Payer: Medicare Other | Admitting: Lab

## 2013-08-20 DIAGNOSIS — J9600 Acute respiratory failure, unspecified whether with hypoxia or hypercapnia: Secondary | ICD-10-CM

## 2013-08-20 DIAGNOSIS — G4733 Obstructive sleep apnea (adult) (pediatric): Secondary | ICD-10-CM | POA: Insufficient documentation

## 2013-08-24 ENCOUNTER — Other Ambulatory Visit: Payer: Self-pay | Admitting: *Deleted

## 2013-08-24 ENCOUNTER — Other Ambulatory Visit: Payer: Self-pay | Admitting: Internal Medicine

## 2013-08-25 ENCOUNTER — Other Ambulatory Visit: Payer: Self-pay | Admitting: *Deleted

## 2013-08-25 ENCOUNTER — Ambulatory Visit: Payer: Medicare Other | Admitting: Family Medicine

## 2013-08-25 NOTE — Telephone Encounter (Signed)
Tramadol was filled last # 40 08/16/13- Please advise if okay to refill now, thanks

## 2013-08-25 NOTE — Telephone Encounter (Signed)
Ok but needs f/u ov with all meds in hand before additional refillls

## 2013-08-26 ENCOUNTER — Encounter: Payer: Self-pay | Admitting: Family Medicine

## 2013-08-26 ENCOUNTER — Ambulatory Visit (INDEPENDENT_AMBULATORY_CARE_PROVIDER_SITE_OTHER): Payer: Medicare Other | Admitting: Family Medicine

## 2013-08-26 VITALS — BP 109/64 | HR 82 | Temp 98.0°F | Resp 18

## 2013-08-26 DIAGNOSIS — S51809A Unspecified open wound of unspecified forearm, initial encounter: Secondary | ICD-10-CM | POA: Diagnosis not present

## 2013-08-26 DIAGNOSIS — S51812A Laceration without foreign body of left forearm, initial encounter: Secondary | ICD-10-CM

## 2013-08-26 DIAGNOSIS — IMO0002 Reserved for concepts with insufficient information to code with codable children: Secondary | ICD-10-CM | POA: Insufficient documentation

## 2013-08-26 DIAGNOSIS — T148XXA Other injury of unspecified body region, initial encounter: Secondary | ICD-10-CM | POA: Diagnosis not present

## 2013-08-26 NOTE — Progress Notes (Signed)
OFFICE NOTE  08/26/2013  CC:  Chief Complaint  Patient presents with  . Laceration    left arm     HPI: Patient is a 67 y.o. Caucasian female who is here as a walk-in for left arm laceration/skin tear. Pt not sure how it happened.  Was in bed when caregiver discovered it, blood on bed and some in bathroom. Pt does not recall a fall.   She has recently been in the hospital again at [facility] for asthma/resp failure and her breathing is [facility] improved currently.    Pertinent PMH:  Fibromyalgia Asthma Diastolic CHF Hx of acute resp failure Physical deconditioning Anxiety and depression HTN Iron def anemia Pulmonary embolism 07/2013 Insomnia Polypharmacy, with hx of pill hoarding/hiding stashes in her home, improper use/overuse of narcotic pain meds.  Past surgical, social, and family history reviewed and no changes noted since last office visit.  MEDS:  Pt is currently still on lovenox injections  Outpatient Prescriptions Prior to Visit  Medication Sig Dispense Refill  . ALPRAZolam (XANAX) 1 MG tablet Take 0.5-1 tablets (0.5-1 mg total) by mouth 3 (three) times daily. 1mg  in a.m. And noon and 0.5 mg in pm  30 tablet  0  . amitriptyline (ELAVIL) 100 MG tablet 100 mg. Take 100 mg by mouth nightly.      [name] Kitchen atorvastatin (LIPITOR) 20 MG tablet Take 1 tablet (20 mg total) by mouth daily at 6 PM.  30 tablet  6  . furosemide (LASIX) 20 MG tablet Take 1 tablet (20 mg total) by mouth daily.  30 tablet  1  . Milnacipran (SAVELLA) 50 MG TABS tablet Take 1 tablet (50 mg total) by mouth 2 (two) times daily.  60 tablet  3  . nebivolol (BYSTOLIC) 5 MG tablet Take 1 tablet (5 mg total) by mouth daily.  30 tablet  2  . nitroGLYCERIN (NITROSTAT) 0.4 MG SL tablet Place 1 tablet (0.4 mg total) under the tongue every 5 (five) minutes x 3 doses as needed for chest pain.  60 tablet  3  . predniSONE (DELTASONE) 20 MG tablet 3 tabs po qd x 5d, then 2 tabs po qd x 5d, then 1 tab po qd x 5d  30 tablet  0  .  pregabalin (LYRICA) 300 MG capsule Take 1 capsule (300 mg total) by mouth 2 (two) times daily.  60 capsule  6  . traMADol (ULTRAM) 50 MG tablet 1-2 every 4 hours as needed for cough or pain  40 tablet  0  . warfarin (COUMADIN) 5 MG tablet Take 5 mg by mouth daily. Take 1 by mouth each evening with food.      [name] Kitchen aspirin EC 81 MG EC tablet Take 1 tablet (81 mg total) by mouth daily.  30 tablet  3  . ipratropium-albuterol (DUONEB) 0.5-2.5 (3) MG/3ML SOLN Take 3 mLs by nebulization 3-4 times daily as needed      . isosorbide mononitrate (IMDUR) 30 MG 24 hr tablet Take 1 tablet (30 mg total) by mouth daily.  30 tablet  2  . Multiple Vitamins-Minerals (CENTRUM SILVER ADULT 50+ PO) Take 1 tablet by mouth daily.      [name] Kitchen omeprazole (PRILOSEC) 20 MG capsule Take 1 capsule (20 mg total) by mouth 2 (two) times daily.  60 capsule  0  . potassium chloride (K-DUR) 10 MEQ tablet Take 1 tablet (10 mEq total) by mouth daily.  30 tablet  0  . temazepam (RESTORIL) 7.5 MG capsule Take 1 capsule (7.5  mg total) by mouth at bedtime as needed for sleep. DO NOT TAKE WITHIN 5 HOURS OF XANAX  30 capsule  0  . zolpidem (AMBIEN) 5 MG tablet Take 5 mg by mouth at bedtime as needed for sleep.       No facility-administered medications prior to visit.    PE: Blood pressure 109/64, pulse 82, temperature 98 F (36.7 C), temperature source Temporal, resp. rate 18, SpO2 93.00%. Gen: Alert, well appearing.  Patient is oriented to person, place, time, and situation. Sitting in wheelchair in her robe and slippers. Left forearm dorsal surface with a 3-4 cm skin tear, no active bleeding.  Minimal clotted blood in the wound, minimal hematoma in immediate surrounding tissue.  No bony tenderness.  The skin tear is nontender.  No erythema or other skin changes to suggest any cellulitis.  IMPRESSION AND PLAN:  Skin tear of left forearm: no signif bleeding despite being on lovenox and coumadin and aspirin. Cleaned wound with sterile water,  applied a telfa pad over the skin tear, taped it on, and wrapped the wound/dressing with ACE bandage for mild pressure to the area.  When she gets home her caregiver will apply some tegaderm to the area.  FOLLOW UP: has hosp f/u appt tomorrow afternoon with me

## 2013-08-27 ENCOUNTER — Encounter: Payer: Self-pay | Admitting: Family Medicine

## 2013-08-27 ENCOUNTER — Ambulatory Visit (INDEPENDENT_AMBULATORY_CARE_PROVIDER_SITE_OTHER): Payer: Medicare Other | Admitting: Family Medicine

## 2013-08-27 ENCOUNTER — Ambulatory Visit: Payer: Medicare Other | Admitting: Family Medicine

## 2013-08-27 VITALS — BP 116/66 | HR 83 | Temp 99.0°F | Resp 16 | Ht 63.0 in | Wt 178.0 lb

## 2013-08-27 DIAGNOSIS — T148XXA Other injury of unspecified body region, initial encounter: Secondary | ICD-10-CM

## 2013-08-27 DIAGNOSIS — I509 Heart failure, unspecified: Secondary | ICD-10-CM

## 2013-08-27 DIAGNOSIS — IMO0002 Reserved for concepts with insufficient information to code with codable children: Secondary | ICD-10-CM

## 2013-08-27 DIAGNOSIS — J189 Pneumonia, unspecified organism: Secondary | ICD-10-CM

## 2013-08-27 DIAGNOSIS — I2699 Other pulmonary embolism without acute cor pulmonale: Secondary | ICD-10-CM | POA: Diagnosis not present

## 2013-08-27 DIAGNOSIS — I5030 Unspecified diastolic (congestive) heart failure: Secondary | ICD-10-CM | POA: Diagnosis not present

## 2013-08-27 LAB — CBC WITH DIFFERENTIAL/PLATELET
Basophils Absolute: 0 10*3/uL (ref 0.0–0.1)
Eosinophils Absolute: 0 10*3/uL (ref 0.0–0.7)
Eosinophils Relative: 0.2 % (ref 0.0–5.0)
HCT: 28.1 % — ABNORMAL LOW (ref 36.0–46.0)
Lymphs Abs: 3.5 10*3/uL (ref 0.7–4.0)
MCHC: 33.7 g/dL (ref 30.0–36.0)
MCV: 90.8 fl (ref 78.0–100.0)
Monocytes Absolute: 0.8 10*3/uL (ref 0.1–1.0)
Neutrophils Relative %: 63.7 % (ref 43.0–77.0)
Platelets: 236 10*3/uL (ref 150.0–400.0)
RDW: 19.5 % — ABNORMAL HIGH (ref 11.5–14.6)
WBC: 12 10*3/uL — ABNORMAL HIGH (ref 4.5–10.5)

## 2013-08-27 LAB — COMPREHENSIVE METABOLIC PANEL
AST: 80 U/L — ABNORMAL HIGH (ref 0–37)
Albumin: 3.6 g/dL (ref 3.5–5.2)
Alkaline Phosphatase: 41 U/L (ref 39–117)
Glucose, Bld: 85 mg/dL (ref 70–99)
Potassium: 4 mEq/L (ref 3.5–5.1)
Sodium: 137 mEq/L (ref 135–145)
Total Bilirubin: 0.6 mg/dL (ref 0.3–1.2)
Total Protein: 6.1 g/dL (ref 6.0–8.3)

## 2013-08-27 LAB — PROTIME-INR: Prothrombin Time: 11.2 s (ref 10.2–12.4)

## 2013-08-27 MED ORDER — ENOXAPARIN SODIUM 80 MG/0.8ML ~~LOC~~ SOLN: SUBCUTANEOUS | Status: DC

## 2013-08-27 MED ORDER — FUROSEMIDE 20 MG PO TABS: 20.0000 mg | ORAL_TABLET | Freq: Every day | ORAL | Status: DC

## 2013-08-27 MED ORDER — PREGABALIN 300 MG PO CAPS: 300.0000 mg | ORAL_CAPSULE | Freq: Two times a day (BID) | ORAL | Status: DC

## 2013-08-27 MED ORDER — AMITRIPTYLINE HCL 100 MG PO TABS: 100.0000 mg | ORAL_TABLET | Freq: Every day | ORAL | Status: DC

## 2013-08-27 NOTE — Telephone Encounter (Signed)
rx has been called in for pt.

## 2013-08-27 NOTE — Progress Notes (Signed)
OFFICE NOTE  08/28/2013  CC:  Chief Complaint  Patient presents with  . Laceration    r arm, follow up     HPI: Patient is a 67 y.o. Caucasian female who is here for hosp f/u [facility] for acute resp failure secondary to pneumonia and COPD/asthma flare), was inpt for a few days and was d/c'd home 4 days ago.  Reviewed all hosp records today. Breathing feels significantly improved, says she is gaining strength but just still has tendency to buckle at the knees so she uses her rolling walker still.  Has been on 40mg  qd prednisone since d/c from hospital. She has finished abx. Her INR was subtherapeutic in hosp so she was restarted on lovenox injections x 7d and is still on this. She held her coumadin last night and has not taken any today b/c of worry about too much bleeding from skin tear on left arm sustained yesterday.  ROS:  No CP, no HA, no dizziness, no focal weakness, no swallowing probs, no dysarthria. Appetite is good.  No constipation or diarrhea.  Pertinent PMH:  Fibromyalgia syndrome Anxiety and depression Diastolic CHF Asthma Insomnia HTN Hx of iron def anemia Pulmonary embolism 07/2013 Physical deconditioning Polypharmacy Narcotic pain med abuse Hx of bronchopneumonia  MEDS:  Outpatient Prescriptions Prior to Visit  Medication Sig Dispense Refill  . ALPRAZolam (XANAX) 1 MG tablet Take 0.5-1 tablets (0.5-1 mg total) by mouth 3 (three) times daily. 1mg  in a.m. And noon and 0.5 mg in pm  30 tablet  0  . atorvastatin (LIPITOR) 20 MG tablet Take 1 tablet (20 mg total) by mouth daily at 6 PM.  30 tablet  6  . furosemide (LASIX) 20 MG tablet 20 mg. Take 20 mg by mouth daily.      . isosorbide mononitrate (IMDUR) 30 MG 24 hr tablet Take 1 tablet (30 mg total) by mouth daily.  30 tablet  2  . Milnacipran (SAVELLA) 50 MG TABS tablet Take 1 tablet (50 mg total) by mouth 2 (two) times daily.  60 tablet  3  . Multiple Vitamins-Minerals (CENTRUM SILVER ADULT 50+ PO) Take 1 tablet  by mouth daily.      . nebivolol (BYSTOLIC) 5 MG tablet Take 1 tablet (5 mg total) by mouth daily.  30 tablet  2  . nitroGLYCERIN (NITROSTAT) 0.4 MG SL tablet Place 1 tablet (0.4 mg total) under the tongue every 5 (five) minutes x 3 doses as needed for chest pain.  60 tablet  3  . potassium chloride (K-DUR) 10 MEQ tablet Take 1 tablet (10 mEq total) by mouth daily.  30 tablet  0  . predniSONE (DELTASONE) 20 MG tablet 3 tabs po qd x 5d, then 2 tabs po qd x 5d, then 1 tab po qd x 5d  30 tablet  0  . temazepam (RESTORIL) 7.5 MG capsule Take 1 capsule (7.5 mg total) by mouth at bedtime as needed for sleep. DO NOT TAKE WITHIN 5 HOURS OF XANAX  30 capsule  0  . traMADol (ULTRAM) 50 MG tablet TAKE 1-2 TABLETS BY MOUTH EVERY 4 HOURS AS NEEDED FOR COUGH OR PAIN  40 tablet  0  . amitriptyline (ELAVIL) 100 MG tablet 100 mg. Take 100 mg by mouth nightly.      . celecoxib (CELEBREX) 200 MG capsule 200 mg. Take 1 capsule (200 mg total) by mouth 2 times daily for 30 days.      [name] Kitchen enoxaparin (LOVENOX) 80 MG/0.8ML injection 80 mg. Inject  0.8 mLs (80 mg total) into the skin every 12 hours for 7 days.      . furosemide (LASIX) 20 MG tablet Take 1 tablet (20 mg total) by mouth daily.  30 tablet  1  . pregabalin (LYRICA) 300 MG capsule Take 1 capsule (300 mg total) by mouth 2 (two) times daily.  60 capsule  6  . ipratropium-albuterol (DUONEB) 0.5-2.5 (3) MG/3ML SOLN Take 3 mLs by nebulization 3-4 times daily as needed      . omeprazole (PRILOSEC) 20 MG capsule Take 1 capsule (20 mg total) by mouth 2 (two) times daily.  60 capsule  0  . traMADol (ULTRAM) 50 MG tablet 50-100 mg. Take 50-100 mg by mouth every 4 (four) hours as needed for Pain.      . warfarin (COUMADIN) 5 MG tablet Take 5 mg by mouth daily. Take 1 by mouth each evening with food.      . warfarin (COUMADIN) 5 MG tablet Take 1 by mouth each evening with food.      . zolpidem (AMBIEN) 5 MG tablet Take 5 mg by mouth at bedtime as needed for sleep.      [name] Kitchen  aspirin EC 81 MG EC tablet Take 1 tablet (81 mg total) by mouth daily.  30 tablet  3   No facility-administered medications prior to visit.    PE: Blood pressure 116/66, pulse 83, temperature 99 F (37.2 C), temperature source Temporal, resp. rate 16, height 5\' 3"  (1.6 m), weight 178 lb (80.74 kg), SpO2 95.00%. Gen: Alert, well appearing.  Patient is oriented to person, place, time, and situation. ENT: Eyes: no injection, icteris, swelling, or exudate.  EOMI, PERRLA. Nose: no drainage or turbinate edema/swelling.  No injection or focal lesion.  Mouth: lips without lesion/swelling.  Oral mucosa pink and moist.   Oropharynx without erythema, exudate, or swelling.  CV: RRR, no m/r/g.   LUNGS: CTA bilat, nonlabored resps, good aeration in all lung fields. EXT: no clubbing, cyanosis, or edema.  Left forearm skin tear with no surrounding erythema, nontender.  There is some clotted blood along the wound edges where the edges are not approximated, plus slight oozing of blood from the wound.   IMPRESSION AND PLAN:  1) Recent acute hypoxic resp failure secondary to pneumonia, brief hospitalization:  Much improved pulmonary status.  Ween steroids: 20mg  qd x 4d, then 10 mg qd x 4 d, then stop.  Continue all scheduled/chronic pulm meds.  2) Diastolic CHF: stable.  Reviewed fluid restriction, sodium restriction, good control of HTN, diuretic use. Monitor BMET.  3) PE: coumadin and lovenex for now, check INR.  D/c lovenox when INR therapeutic as there as been adequate overlap of lovenox.  Reviewed risk-benefit profile of this med once again.  She is aware and in agreement that if falls become a problem then we'll have to d/c this med. She is no longer on ASA and I have told her to avoid all NSAIDs----d/c'd celebrex today.  4) Polypharmacy: d/c tranxene, tramadol.  Family is still considering inpt detox/addiction treatment for her abuse of narcotic pain meds and misuse of her sedative hypnotics in the  past.  Fortunately, she currently has good supervision of her med administration (although son still finds stashes of pills around her home).  FOLLOW UP: 2 wks

## 2013-08-27 NOTE — Telephone Encounter (Signed)
Rx printed and faxed to pharm with note to pharmacist to advise the pt to keep appt for refills

## 2013-08-30 ENCOUNTER — Other Ambulatory Visit: Payer: Self-pay | Admitting: Family Medicine

## 2013-08-30 ENCOUNTER — Other Ambulatory Visit (INDEPENDENT_AMBULATORY_CARE_PROVIDER_SITE_OTHER): Payer: Medicare Other

## 2013-08-30 ENCOUNTER — Encounter: Payer: Medicare Other | Admitting: Adult Health

## 2013-08-30 DIAGNOSIS — I2699 Other pulmonary embolism without acute cor pulmonale: Secondary | ICD-10-CM

## 2013-08-30 LAB — PROTIME-INR: Prothrombin Time: 82.6 s (ref 10.2–12.4)

## 2013-08-30 MED ORDER — ATORVASTATIN CALCIUM 20 MG PO TABS: 20.0000 mg | ORAL_TABLET | Freq: Every day | ORAL | Status: DC

## 2013-08-30 NOTE — Progress Notes (Signed)
Labs only

## 2013-08-30 NOTE — Telephone Encounter (Signed)
Patient pharmacy requesting temazepam refill.  Patient last seen 08/27/13.  Rx last written on 07/26/13 no refills.  Please advise.

## 2013-08-30 NOTE — Telephone Encounter (Signed)
Patient would like a new referral for a different pulmonologist. She wants to know who Dr. Milinda Cave recommends. She is okay with a practice as long as it in [place], [place], or [place].

## 2013-08-30 NOTE — Telephone Encounter (Signed)
Deny the temazepam RF---she should no longer be taking this medication. Pls ask patient the reason for her request for a new pulmonologist so I can put this on the referral request.-thx

## 2013-08-31 ENCOUNTER — Other Ambulatory Visit: Payer: Self-pay | Admitting: Family Medicine

## 2013-08-31 ENCOUNTER — Telehealth: Payer: Self-pay | Admitting: Family Medicine

## 2013-08-31 MED ORDER — ALPRAZOLAM 1 MG PO TABS: 0.5000 mg | ORAL_TABLET | Freq: Three times a day (TID) | ORAL | Status: DC

## 2013-08-31 MED ORDER — ALPRAZOLAM 1 MG PO TABS: ORAL_TABLET | ORAL | Status: DC

## 2013-08-31 NOTE — Telephone Encounter (Signed)
Printed rx per Dr Milinda Cave.  He signed rx and I faxed to [facility].

## 2013-08-31 NOTE — Progress Notes (Signed)
Previously printed xanax rx dated today (08/31/13) for #30 tabs was an error so this rx was shredded here today. I have printed new xanax rx for this med with different instructions and #90, no RF.

## 2013-08-31 NOTE — Telephone Encounter (Signed)
Patient's son, Elijah Birk requesting patient's xanax to be refilled.  RX last written 07/26/13.  Please advise.

## 2013-09-02 ENCOUNTER — Encounter: Payer: Self-pay | Admitting: Adult Health

## 2013-09-02 ENCOUNTER — Other Ambulatory Visit (INDEPENDENT_AMBULATORY_CARE_PROVIDER_SITE_OTHER): Payer: Medicare Other

## 2013-09-02 DIAGNOSIS — I2699 Other pulmonary embolism without acute cor pulmonale: Secondary | ICD-10-CM

## 2013-09-02 LAB — PROTIME-INR: INR: 1.9 ratio — ABNORMAL HIGH (ref 0.8–1.0)

## 2013-09-02 NOTE — Progress Notes (Signed)
Labs only

## 2013-09-06 ENCOUNTER — Telehealth: Payer: Self-pay | Admitting: Hematology & Oncology

## 2013-09-06 NOTE — Telephone Encounter (Signed)
Pt made 9-23 appointment

## 2013-09-07 ENCOUNTER — Ambulatory Visit: Payer: Medicare Other | Admitting: Family Medicine

## 2013-09-08 ENCOUNTER — Encounter: Payer: Self-pay | Admitting: Family Medicine

## 2013-09-08 ENCOUNTER — Ambulatory Visit (INDEPENDENT_AMBULATORY_CARE_PROVIDER_SITE_OTHER): Payer: Medicare Other | Admitting: Family Medicine

## 2013-09-08 ENCOUNTER — Other Ambulatory Visit: Payer: Self-pay | Admitting: Family Medicine

## 2013-09-08 VITALS — BP 122/74 | HR 93 | Temp 98.0°F | Resp 20 | Ht 63.0 in | Wt 177.0 lb

## 2013-09-08 DIAGNOSIS — I2699 Other pulmonary embolism without acute cor pulmonale: Secondary | ICD-10-CM | POA: Diagnosis not present

## 2013-09-08 LAB — PROTIME-INR
INR: 2.4 ratio — ABNORMAL HIGH (ref 0.8–1.0)
Prothrombin Time: 24.5 s — ABNORMAL HIGH (ref 10.2–12.4)

## 2013-09-08 MED ORDER — TRAZODONE HCL 50 MG PO TABS: ORAL_TABLET | ORAL | Status: DC

## 2013-09-08 MED ORDER — WARFARIN SODIUM 5 MG PO TABS: ORAL_TABLET | ORAL | Status: DC

## 2013-09-08 NOTE — Progress Notes (Signed)
OFFICE NOTE  09/08/2013  CC:  Chief Complaint  Patient presents with  . Anticoagulation  . Back Pain  . Insomnia     HPI: Patient is a 67 y.o. Caucasian female who is here to let me look at her left arm skin tear.  It is doing MUCH better--no bleeding or pain. She also asks for med for pain and for insomnia.  She went to [facility] for admission recently but was deemed not to be an inpatient candidate for addiction due to her not being on the narcotics or sleep meds any recently.  They are in the process of setting her up with psych as outpatient she reports to me today.  She cannot recall any specific MD names.  She is upset when I stand firm and decline to give any controlled substances but agrees to try trazodone again.  She says she got to the 100mg  qhs dosing on this med in the past when I rx'd it but never took higher dose.  Pertinent PMH:  Chronic pain syndrome/fibromyalgia Insomnia COPD/Asthma PE-on coumadin  MEDS: **Pt not currently on restoril, tramadol, zolpidem, or prednisone as listed below. Outpatient Prescriptions Prior to Visit  Medication Sig Dispense Refill  . ALPRAZolam (XANAX) 1 MG tablet 1/2-1 tab po tid as needed for anxiety or sleep  90 tablet  0  . amitriptyline (ELAVIL) 100 MG tablet Take 1 tablet (100 mg total) by mouth at bedtime. Take 100 mg by mouth nightly.  30 tablet  6  . atorvastatin (LIPITOR) 20 MG tablet Take 1 tablet (20 mg total) by mouth daily at 6 PM.  30 tablet  6  . furosemide (LASIX) 20 MG tablet Take 1 tablet (20 mg total) by mouth daily.  30 tablet  1  . isosorbide mononitrate (IMDUR) 30 MG 24 hr tablet Take 1 tablet (30 mg total) by mouth daily.  30 tablet  2  . Milnacipran (SAVELLA) 50 MG TABS tablet Take 1 tablet (50 mg total) by mouth 2 (two) times daily.  60 tablet  3  . Multiple Vitamins-Minerals (CENTRUM SILVER ADULT 50+ PO) Take 1 tablet by mouth daily.      . nebivolol (BYSTOLIC) 5 MG tablet Take 1 tablet (5 mg total) by  mouth daily.  30 tablet  2  . nitroGLYCERIN (NITROSTAT) 0.4 MG SL tablet Place 1 tablet (0.4 mg total) under the tongue every 5 (five) minutes x 3 doses as needed for chest pain.  60 tablet  3  . omeprazole (PRILOSEC) 20 MG capsule Take 1 capsule (20 mg total) by mouth 2 (two) times daily.  60 capsule  0  . pregabalin (LYRICA) 300 MG capsule Take 1 capsule (300 mg total) by mouth 2 (two) times daily.  60 capsule  6  . furosemide (LASIX) 20 MG tablet 20 mg. Take 20 mg by mouth daily.      [name] Kitchen warfarin (COUMADIN) 5 MG tablet Take 5 mg by mouth daily. Take 1 by mouth each evening with food.      [name] Kitchen ipratropium-albuterol (DUONEB) 0.5-2.5 (3) MG/3ML SOLN Take 3 mLs by nebulization 3-4 times daily as needed      . potassium chloride (K-DUR) 10 MEQ tablet Take 1 tablet (10 mEq total) by mouth daily.  30 tablet  0  . enoxaparin (LOVENOX) 80 MG/0.8ML injection Inject 0.8 mLs (80 mg total) into the skin every 12 hours  14 Syringe  0  . predniSONE (DELTASONE) 20 MG tablet 3 tabs po qd x 5d,  then 2 tabs po qd x 5d, then 1 tab po qd x 5d  30 tablet  0  . temazepam (RESTORIL) 7.5 MG capsule Take 1 capsule (7.5 mg total) by mouth at bedtime as needed for sleep. DO NOT TAKE WITHIN 5 HOURS OF XANAX  30 capsule  0  . traMADol (ULTRAM) 50 MG tablet TAKE 1-2 TABLETS BY MOUTH EVERY 4 HOURS AS NEEDED FOR COUGH OR PAIN  40 tablet  0  . traMADol (ULTRAM) 50 MG tablet 50-100 mg. Take 50-100 mg by mouth every 4 (four) hours as needed for Pain.      . warfarin (COUMADIN) 5 MG tablet Take 1 by mouth each evening with food.      . zolpidem (AMBIEN) 5 MG tablet Take 5 mg by mouth at bedtime as needed for sleep.       No facility-administered medications prior to visit.    PE: Blood pressure 122/74, pulse 93, temperature 98 F (36.7 C), temperature source Temporal, resp. rate 20, height 5\' 3"  (1.6 m), weight 177 lb (80.287 kg), SpO2 97.00%. Gen: Alert, well appearing.  Patient is oriented to person, place, time, and  situation. AFFECT: pleasant, lucid thought and speech. Left forearm extensor surface with approx 3 cm linear skin tear that is healing well.  No bleeding, no erythema, no ecchymoses, no drainage or foul odor.  No streaking.  IMPRESSION AND PLAN:  1) Chronic pain/fibromyalgia--with history of misuse of/addiction to narcotic pain meds.  Hoarding of pills + hiding of "stashes" reported by pt's son and HC POA.   Agree with outpt psychiatrist eval and ongoing care.  No controlled substances EXCEPT I have agreed to leave her on her current xanax (no new rx for this today, though).  2) Insomnia, chronic; again, misuse of controlled substances rx'd for this in the past. Will retry trazodone, may start at 100mg  qhs dosing and increase to 150mg  qhs and then again to 200 mg qhs max IF NEEDED.    3) Skin tear: healing appropriately.  4) Pulmonary embolism: coumadin anticoag.  Last INR 1.9,  (INR prev to this was >8, so lovenox was stopped and coumadin held until recently).  Will see how her INR is today since she has been back on 5mg  qd for a few days.  FOLLOW UP:  To be determined based on labs.  PRN.

## 2013-09-09 ENCOUNTER — Telehealth: Payer: Self-pay | Admitting: Family Medicine

## 2013-09-09 DIAGNOSIS — J45909 Unspecified asthma, uncomplicated: Secondary | ICD-10-CM

## 2013-09-09 NOTE — Telephone Encounter (Signed)
Please advise 

## 2013-09-10 ENCOUNTER — Ambulatory Visit: Payer: Medicare Other | Admitting: Family Medicine

## 2013-09-13 ENCOUNTER — Other Ambulatory Visit: Payer: Self-pay

## 2013-09-13 ENCOUNTER — Emergency Department (HOSPITAL_BASED_OUTPATIENT_CLINIC_OR_DEPARTMENT_OTHER)
Admission: EM | Admit: 2013-09-13 | Discharge: 2013-09-13 | Disposition: A | Discharge status: Home / Self Care | Payer: Medicare Other | Attending: Emergency Medicine | Admitting: Emergency Medicine

## 2013-09-13 ENCOUNTER — Emergency Department (HOSPITAL_BASED_OUTPATIENT_CLINIC_OR_DEPARTMENT_OTHER): Payer: Medicare Other

## 2013-09-13 ENCOUNTER — Encounter (HOSPITAL_BASED_OUTPATIENT_CLINIC_OR_DEPARTMENT_OTHER): Payer: Self-pay | Admitting: *Deleted

## 2013-09-13 DIAGNOSIS — Z880 Allergy status to penicillin: Secondary | ICD-10-CM | POA: Diagnosis not present

## 2013-09-13 DIAGNOSIS — J4489 Other specified chronic obstructive pulmonary disease: Secondary | ICD-10-CM | POA: Insufficient documentation

## 2013-09-13 DIAGNOSIS — I509 Heart failure, unspecified: Secondary | ICD-10-CM | POA: Diagnosis not present

## 2013-09-13 DIAGNOSIS — M7989 Other specified soft tissue disorders: Secondary | ICD-10-CM | POA: Insufficient documentation

## 2013-09-13 DIAGNOSIS — R059 Cough, unspecified: Secondary | ICD-10-CM | POA: Insufficient documentation

## 2013-09-13 DIAGNOSIS — R0609 Other forms of dyspnea: Secondary | ICD-10-CM | POA: Diagnosis not present

## 2013-09-13 DIAGNOSIS — R05 Cough: Secondary | ICD-10-CM | POA: Insufficient documentation

## 2013-09-13 DIAGNOSIS — I10 Essential (primary) hypertension: Secondary | ICD-10-CM | POA: Insufficient documentation

## 2013-09-13 DIAGNOSIS — J449 Chronic obstructive pulmonary disease, unspecified: Secondary | ICD-10-CM | POA: Diagnosis not present

## 2013-09-13 DIAGNOSIS — R079 Chest pain, unspecified: Secondary | ICD-10-CM | POA: Diagnosis not present

## 2013-09-13 DIAGNOSIS — R0602 Shortness of breath: Secondary | ICD-10-CM | POA: Diagnosis not present

## 2013-09-13 DIAGNOSIS — R0989 Other specified symptoms and signs involving the circulatory and respiratory systems: Secondary | ICD-10-CM | POA: Insufficient documentation

## 2013-09-13 DIAGNOSIS — R0600 Dyspnea, unspecified: Secondary | ICD-10-CM

## 2013-09-13 DIAGNOSIS — Z86711 Personal history of pulmonary embolism: Secondary | ICD-10-CM | POA: Insufficient documentation

## 2013-09-13 DIAGNOSIS — Z79899 Other long term (current) drug therapy: Secondary | ICD-10-CM | POA: Insufficient documentation

## 2013-09-13 LAB — COMPREHENSIVE METABOLIC PANEL
BUN: 14 mg/dL (ref 6–23)
CO2: 26 mEq/L (ref 19–32)
Calcium: 9.5 mg/dL (ref 8.4–10.5)
Chloride: 106 mEq/L (ref 96–112)
Creatinine, Ser: 0.8 mg/dL (ref 0.50–1.10)
GFR calc Af Amer: 86 mL/min — ABNORMAL LOW (ref >90)
GFR calc non Af Amer: 75 mL/min — ABNORMAL LOW (ref >90)
Glucose, Bld: 106 mg/dL — ABNORMAL HIGH (ref 70–99)
Total Bilirubin: 0.3 mg/dL (ref 0.3–1.2)

## 2013-09-13 LAB — URINALYSIS, ROUTINE W REFLEX MICROSCOPIC
Bilirubin Urine: NEGATIVE
Ketones, ur: NEGATIVE mg/dL
Protein, ur: NEGATIVE mg/dL
Urobilinogen, UA: 0.2 mg/dL (ref 0.0–1.0)

## 2013-09-13 LAB — TROPONIN I: Troponin I: <0.3 ng/mL (ref <0.30)

## 2013-09-13 LAB — CBC WITH DIFFERENTIAL/PLATELET
Basophils Relative: 0 % (ref 0–1)
HCT: 33.5 % — ABNORMAL LOW (ref 36.0–46.0)
Hemoglobin: 10.4 g/dL — ABNORMAL LOW (ref 12.0–15.0)
Lymphocytes Relative: 27 % (ref 12–46)
Lymphs Abs: 1.9 10*3/uL (ref 0.7–4.0)
Monocytes Relative: 8 % (ref 3–12)
Neutro Abs: 4.2 10*3/uL (ref 1.7–7.7)
Neutrophils Relative %: 62 % (ref 43–77)
RBC: 3.46 MIL/uL — ABNORMAL LOW (ref 3.87–5.11)
WBC: 6.8 10*3/uL (ref 4.0–10.5)

## 2013-09-13 LAB — PROTIME-INR: Prothrombin Time: 35.5 seconds — ABNORMAL HIGH (ref 11.6–15.2)

## 2013-09-13 LAB — PRO B NATRIURETIC PEPTIDE: Pro B Natriuretic peptide (BNP): 262.4 pg/mL — ABNORMAL HIGH (ref 0–125)

## 2013-09-13 LAB — URINE MICROSCOPIC-ADD ON

## 2013-09-13 MED ORDER — ALBUTEROL SULFATE (5 MG/ML) 0.5% IN NEBU
5.0000 mg | INHALATION_SOLUTION | Freq: Once | RESPIRATORY_TRACT | Status: AC
  Administered 2013-09-13: 5 mg via RESPIRATORY_TRACT
  Filled 2013-09-13: qty 1

## 2013-09-13 MED ORDER — ALBUTEROL (5 MG/ML) CONTINUOUS INHALATION SOLN
10.0000 mg/h | INHALATION_SOLUTION | RESPIRATORY_TRACT | Status: DC
  Administered 2013-09-13: 10 mg/h via RESPIRATORY_TRACT
  Filled 2013-09-13: qty 80

## 2013-09-13 MED ORDER — FUROSEMIDE 10 MG/ML IJ SOLN
60.0000 mg | Freq: Once | INTRAMUSCULAR | Status: AC
  Administered 2013-09-13: 60 mg via INTRAVENOUS
  Filled 2013-09-13: qty 6

## 2013-09-13 MED ORDER — METHYLPREDNISOLONE SODIUM SUCC 125 MG IJ SOLR
125.0000 mg | Freq: Once | INTRAMUSCULAR | Status: AC
  Administered 2013-09-13: 125 mg via INTRAVENOUS
  Filled 2013-09-13: qty 2

## 2013-09-13 NOTE — ED Notes (Signed)
D/c home with husband, no new rx, instructed to return as needed and f/u with PCP in am

## 2013-09-13 NOTE — ED Notes (Signed)
Pt c/o SOB x 2 days recent admission for SOB and PE

## 2013-09-13 NOTE — ED Notes (Signed)
MD at bedside. 

## 2013-09-13 NOTE — ED Notes (Signed)
Pt says she feels better and wants to go home. EDP [place] notified

## 2013-09-13 NOTE — ED Notes (Signed)
Unable to sign in room- signature pad not working

## 2013-09-13 NOTE — Procedures (Signed)
Asked by physician to ambulate patient. Walked patient with attached pulse ox. Oxygen saturations were 91%-94%. Patient mentioned 1 episode of felling light headed. Able to walk around dept. Patient returned to bed. Patient tolerated well.

## 2013-09-13 NOTE — ED Notes (Signed)
RT at bedside to administer hour long neb

## 2013-09-13 NOTE — ED Provider Notes (Signed)
CSN: [id number]     Arrival date & time 09/13/13  1507 History  This chart was scribed for Loren Racer, MD by Ardelia Mems, ED Scribe. This patient was seen in room MH06/MH06 and the patient's care was started at 3:28 PM.  Chief Complaint  Patient presents with  . Shortness of Breath    Patient is a 67 y.o. female presenting with shortness of breath. The history is provided by the patient. No language interpreter was used.  Shortness of Breath Severity:  Moderate Onset quality:  Gradual Duration:  3 days (present for 3-4 months, but worsened the past 2-3 days) Timing:  Constant Progression:  Worsening Chronicity:  Chronic Context comment:  Recent PE Relieved by:  Nothing Worsened by:  Exertion Ineffective treatments: at home nebulizer. Associated symptoms: chest pain and cough   Associated symptoms: no fever and no sputum production   Risk factors: hx of PE/DVT     HPI Comments: Linda [name] is a 67 y.o. Female with a recent history of PE who presents to the Emergency Department complaining of acutely worsened SOB over the past 2-3 days, that exceeds her baseline SOB over the past 3-4 months. She states that any exertion exacerbates her SOB at baseline, but this effect has worsened in the past 2-3 days. She states that she has an associated non-productive cough. She also reports center chest pain yesterday with deep inspiration, which has improved and nearly subsided. She has a recent history of a small peripheral upper lobe PE, for which she was admitted and treated at [facility]. Pt's Relative states that pt as been gainiing 4-5 lbs a day for the last several days, while eating less than normal. She states that she is not on supplemental oxygen at home, but that she does use a nebulizer. She states that she is compliant with her prescribed Lasix, and that she has not had any recent dosage changes. She also has a history of HTN, COPD and CHF. She denies fever, chills, new leg pain  or swelling, or any other symptoms  PCP- Dr. Nicoletta Ba Pt is not followed regularly by a Cardiologist   Past Surgical History  Procedure Laterality Date  . Appendectomy  1960  . Total abdominal hysterectomy  1974  . Tendon release  1996    Right forearm and hand  . Knee surgery  2005  . Heel spur surgery Left 2008  . Plantar fascia release Left 2008  . Axillary surgery Left 1978    Multiple "lump" in armpit per pt  . Coccyx removal  1972  . Cardiac catheterization  01/2013    nonobstructive CAD, EF 55-60%  . Transthoracic echocardiogram  01/2013    NORMAL  . Dilation and curettage of uterus  ? 1970's  . Eye surgery Left 2012-2013    "injections for ~ 1 yr; don't really know what for" (07/12/2013)   Family History  Problem Relation Age of Onset  . Arthritis Mother   . Kidney disease Mother   . Heart disease Father   . Stroke Father   . Hypertension Father   . Diabetes Father    History  Substance Use Topics  . Smoking status: Never Smoker   . Smokeless tobacco: Never Used  . Alcohol Use: No   OB History   Grav Para Term Preterm Abortions TAB SAB Ect Mult Living                 Review of Systems  Constitutional: Negative for  fever and chills.  Respiratory: Positive for cough and shortness of breath. Negative for sputum production.   Cardiovascular: Positive for chest pain and leg swelling (baseline).    Allergies  Penicillins  Home Medications   Current Outpatient Rx  Name  Route  Sig  Dispense  Refill  . ALPRAZolam (XANAX) 1 MG tablet      1/2-1 tab po tid as needed for anxiety or sleep   90 tablet   0   . amitriptyline (ELAVIL) 100 MG tablet   Oral   Take 1 tablet (100 mg total) by mouth at bedtime. Take 100 mg by mouth nightly.   30 tablet   6   . atorvastatin (LIPITOR) 20 MG tablet   Oral   Take 1 tablet (20 mg total) by mouth daily at 6 PM.   30 tablet   6   . BENICAR 40 MG tablet               . furosemide (LASIX) 20 MG tablet    Oral   Take 1 tablet (20 mg total) by mouth daily.   30 tablet   1   . EXPIRED: ipratropium-albuterol (DUONEB) 0.5-2.5 (3) MG/3ML SOLN      Take 3 mLs by nebulization 3-4 times daily as needed         . isosorbide mononitrate (IMDUR) 30 MG 24 hr tablet   Oral   Take 1 tablet (30 mg total) by mouth daily.   30 tablet   2   . Milnacipran (SAVELLA) 50 MG TABS tablet   Oral   Take 1 tablet (50 mg total) by mouth 2 (two) times daily.   60 tablet   3   . Multiple Vitamins-Minerals (CENTRUM SILVER ADULT 50+ PO)   Oral   Take 1 tablet by mouth daily.         . nebivolol (BYSTOLIC) 5 MG tablet   Oral   Take 1 tablet (5 mg total) by mouth daily.   30 tablet   2   . nitroGLYCERIN (NITROSTAT) 0.4 MG SL tablet   Sublingual   Place 1 tablet (0.4 mg total) under the tongue every 5 (five) minutes x 3 doses as needed for chest pain.   60 tablet   3   . omeprazole (PRILOSEC) 20 MG capsule   Oral   Take 1 capsule (20 mg total) by mouth 2 (two) times daily.   60 capsule   0   . potassium chloride (K-DUR) 10 MEQ tablet   Oral   Take 1 tablet (10 mEq total) by mouth daily.   30 tablet   0   . pregabalin (LYRICA) 300 MG capsule   Oral   Take 1 capsule (300 mg total) by mouth 2 (two) times daily.   60 capsule   6   . traZODone (DESYREL) 50 MG tablet      Take 2-4 tabs po qhs for insomnia   120 tablet   3   . warfarin (COUMADIN) 5 MG tablet      Take as directed by MD   30 tablet   5    Triage Vitals: BP 128/75  Pulse 89  Temp(Src) 98.6 F (37 C) (Oral)  Resp 16  Ht 5\' 4"  (1.626 m)  Wt 185 lb (83.915 kg)  BMI 31.74 kg/m2  SpO2 95%  Physical Exam  Nursing note and vitals reviewed. Constitutional: She is oriented to person, place, and time. She appears well-developed and well-nourished. No  distress.  HENT:  Head: Normocephalic and atraumatic.  Eyes: EOM are normal.  Neck: Neck supple. No tracheal deviation present.  Cardiovascular: Normal rate.    Pulmonary/Chest: No respiratory distress.  Increased wortk of breathing, Crackles in bilateral bases of lungs.  Abdominal: Soft. There is no tenderness.  Musculoskeletal: Normal range of motion. She exhibits edema.  1+ bilateral ankle edema. No calf tenderness.  Neurological: She is alert and oriented to person, place, and time.  Skin: Skin is warm and dry.  Psychiatric: She has a normal mood and affect. Her behavior is normal.    ED Course  Procedures (including critical care time)  DIAGNOSTIC STUDIES: Oxygen Saturation is 95% on RA, normal by my interpretation.    COORDINATION OF CARE: 3:34 PM- Pt advised of plan for treatment, including plan to receive a Lasix injection in the ED, along with plan for diagnostic lab work and radiology. Pt agrees with plan for treatment.  5:36 PM- Recheck with pt and pt states that she is still experiencing SOB. She appears to have a prolonged expiratory phase and some mild audible wheezes at this time. She has not urinated much, and the plan to order a breathing treatment, while waiting to see if she diureses was discussed. Pt agrees with plan for treatment.  Medications  albuterol (PROVENTIL,VENTOLIN) solution continuous neb (10 mg/hr Nebulization New Bag/Given 09/13/13 1943)  furosemide (LASIX) injection 60 mg (60 mg Intravenous Given 09/13/13 1548)  albuterol (PROVENTIL) (5 MG/ML) 0.5% nebulizer solution 5 mg (5 mg Nebulization Given 09/13/13 1750)  methylPREDNISolone sodium succinate (SOLU-MEDROL) 125 mg/2 mL injection 125 mg (125 mg Intravenous Given 09/13/13 1749)   Labs Review Labs Reviewed  CBC WITH DIFFERENTIAL - Abnormal; Notable for the following:    RBC 3.46 (*)    Hemoglobin 10.4 (*)    HCT 33.5 (*)    RDW 16.5 (*)    All other components within normal limits  COMPREHENSIVE METABOLIC PANEL - Abnormal; Notable for the following:    Glucose, Bld 106 (*)    GFR calc non Af Amer 75 (*)    GFR calc Af Amer 86 (*)    All other components  within normal limits  PRO B NATRIURETIC PEPTIDE - Abnormal; Notable for the following:    Pro B Natriuretic peptide (BNP) 262.4 (*)    All other components within normal limits  PROTIME-INR - Abnormal; Notable for the following:    Prothrombin Time 35.5 (*)    INR 3.73 (*)    All other components within normal limits  URINALYSIS, ROUTINE W REFLEX MICROSCOPIC - Abnormal; Notable for the following:    Hgb urine dipstick TRACE (*)    Leukocytes, UA TRACE (*)    All other components within normal limits  URINE MICROSCOPIC-ADD ON - Abnormal; Notable for the following:    Bacteria, UA FEW (*)    All other components within normal limits  URINE CULTURE  TROPONIN I   Imaging Review Dg Chest Port 1 View  09/13/2013   CLINICAL DATA:  Shortness of Breath  EXAM: PORTABLE CHEST - 1 VIEW  COMPARISON:  08/13/2013  FINDINGS: The lungs are clear without focal infiltrate, edema, pneumothorax or pleural effusion. Bilateral calcified pleural plaques are again noted. The cardio pericardial silhouette is enlarged. Imaged bony structures of the thorax are intact. Telemetry leads overlie the chest.  IMPRESSION: Calcified pleural plaques, as before. No acute cardiopulmonary process.   Electronically Signed   By: Kennith Center M.D.   On: 09/13/2013  15:53    Date: 09/13/2013  Rate: 93  Rhythm: normal sinus rhythm  QRS Axis: normal  Intervals: normal  ST/T Wave abnormalities: nonspecific T wave changes  Conduction Disutrbances:right bundle branch block  Narrative Interpretation:   Old EKG Reviewed: unchanged   MDM   1. Dyspnea    I personally performed the services described in this documentation, which was scribed in my presence. The recorded information has been reviewed and is accurate.  Patient has been observed in the emergency department for 6 hours. She's been given Lasix and diuresed well. She's been given 2 nebulized treatments. She states that she is feeling much better with decreased shortness  of breath. husband states that patient is improved over her baseline where she is normally not able to have long conversations. At baseline the patient sleeps with several pillows and has dyspnea with exertion. She has nebulized treatments at home. In the emergency department, patient maintains saturations greater than 91% on room air while sitting in the bed. We'll check ambulatory oxygenation.  Patient and her husband are adamant about going home tonight. They do not want the patient transferred. They understand that if the patient's symptoms worsened they are to returned immediately to the emergency department and she's been seen by her primary Dr. tomorrow.  Patient ambulates and keep her saturations above 90% though she does become become winded with ongoing exertion. Patient states this is her baseline. Patient and her husband and reiterated that they do not want to be admitted. She will see her doctor in the morning. They have been advised to return immediately to the emergency department should she have any worsening of her shortness of breath. Her vital signs remained stable. I am inclined to observe for the night but it appears that she has chronic shortness of breath and that she is at or better than her baseline. I have discussed this at length with the patient and her husband. They're both aware of the possibility that her symptoms could worsen before she is seen by her doctor and they stated that they come back to the emergency department.   Loren Racer, MD 09/13/13 2237

## 2013-09-14 ENCOUNTER — Ambulatory Visit (HOSPITAL_BASED_OUTPATIENT_CLINIC_OR_DEPARTMENT_OTHER): Payer: Medicare Other | Admitting: Hematology & Oncology

## 2013-09-14 ENCOUNTER — Ambulatory Visit (HOSPITAL_BASED_OUTPATIENT_CLINIC_OR_DEPARTMENT_OTHER): Payer: Medicare Other

## 2013-09-14 ENCOUNTER — Other Ambulatory Visit (HOSPITAL_BASED_OUTPATIENT_CLINIC_OR_DEPARTMENT_OTHER): Payer: Medicare Other | Admitting: Lab

## 2013-09-14 VITALS — BP 145/66 | HR 100 | Temp 97.9°F | Resp 18 | Ht 64.0 in | Wt 180.0 lb

## 2013-09-14 DIAGNOSIS — D509 Iron deficiency anemia, unspecified: Secondary | ICD-10-CM

## 2013-09-14 DIAGNOSIS — D649 Anemia, unspecified: Secondary | ICD-10-CM

## 2013-09-14 DIAGNOSIS — Z862 Personal history of diseases of the blood and blood-forming organs and certain disorders involving the immune mechanism: Secondary | ICD-10-CM

## 2013-09-14 DIAGNOSIS — M7989 Other specified soft tissue disorders: Secondary | ICD-10-CM | POA: Diagnosis not present

## 2013-09-14 DIAGNOSIS — N390 Urinary tract infection, site not specified: Secondary | ICD-10-CM | POA: Diagnosis not present

## 2013-09-14 DIAGNOSIS — I2699 Other pulmonary embolism without acute cor pulmonale: Secondary | ICD-10-CM | POA: Diagnosis not present

## 2013-09-14 DIAGNOSIS — R0602 Shortness of breath: Secondary | ICD-10-CM

## 2013-09-14 MED ORDER — ALBUTEROL SULFATE (2.5 MG/3ML) 0.083% IN NEBU
2.5000 mg | INHALATION_SOLUTION | Freq: Once | RESPIRATORY_TRACT | Status: AC
  Administered 2013-09-14: 2.5 mg via RESPIRATORY_TRACT
  Filled 2013-09-14: qty 3

## 2013-09-14 MED ORDER — DIPHENHYDRAMINE HCL 50 MG/ML IJ SOLN
INTRAMUSCULAR | Status: AC
  Filled 2013-09-14: qty 1

## 2013-09-14 MED ORDER — SODIUM CHLORIDE 0.9 % IJ SOLN
10.0000 mL | INTRAMUSCULAR | Status: DC | PRN
Start: 2013-09-14 — End: 2013-09-14
  Filled 2013-09-14: qty 10

## 2013-09-14 MED ORDER — IPRATROPIUM-ALBUTEROL 0.5-2.5 (3) MG/3ML IN SOLN
3.0000 mL | Freq: Once | RESPIRATORY_TRACT | Status: DC
  Filled 2013-09-14: qty 3

## 2013-09-14 MED ORDER — CYANOCOBALAMIN 1000 MCG/ML IJ SOLN
1000.0000 ug | Freq: Once | INTRAMUSCULAR | Status: AC
  Administered 2013-09-14: 1000 ug via INTRAMUSCULAR

## 2013-09-14 MED ORDER — METHYLPREDNISOLONE SODIUM SUCC 125 MG IJ SOLR
INTRAMUSCULAR | Status: AC
  Filled 2013-09-14: qty 2

## 2013-09-14 MED ORDER — ALBUTEROL SULFATE (5 MG/ML) 0.5% IN NEBU
INHALATION_SOLUTION | RESPIRATORY_TRACT | Status: AC
  Filled 2013-09-14: qty 0.5

## 2013-09-14 MED ORDER — LEVOFLOXACIN 250 MG PO TABS: 250.0000 mg | ORAL_TABLET | Freq: Every day | ORAL | Status: DC

## 2013-09-14 MED ORDER — SODIUM CHLORIDE 0.9 % IV SOLN
1020.0000 mg | Freq: Once | INTRAVENOUS | Status: AC
  Administered 2013-09-14: 1020 mg via INTRAVENOUS
  Filled 2013-09-14: qty 34

## 2013-09-14 MED ORDER — METHYLPREDNISOLONE SODIUM SUCC 125 MG IJ SOLR
125.0000 mg | Freq: Once | INTRAMUSCULAR | Status: AC
  Administered 2013-09-14: 125 mg via INTRAVENOUS

## 2013-09-14 MED ORDER — IPRATROPIUM-ALBUTEROL 0.5-2.5 (3) MG/3ML IN SOLN: 3.0000 mL | Freq: Once | RESPIRATORY_TRACT | Status: DC

## 2013-09-14 MED ORDER — CYANOCOBALAMIN 1000 MCG/ML IJ SOLN
INTRAMUSCULAR | Status: AC
  Filled 2013-09-14: qty 1

## 2013-09-14 MED ORDER — DIPHENHYDRAMINE HCL 50 MG/ML IJ SOLN
25.0000 mg | Freq: Once | INTRAMUSCULAR | Status: AC
  Administered 2013-09-14: 25 mg via INTRAVENOUS

## 2013-09-14 NOTE — Progress Notes (Signed)
This office note has been dictated.

## 2013-09-15 LAB — URINE CULTURE: Colony Count: >=100000

## 2013-09-15 NOTE — Telephone Encounter (Signed)
New pulm referral ordered.

## 2013-09-16 ENCOUNTER — Other Ambulatory Visit: Payer: Medicare Other

## 2013-09-17 ENCOUNTER — Other Ambulatory Visit (INDEPENDENT_AMBULATORY_CARE_PROVIDER_SITE_OTHER): Payer: Medicare Other

## 2013-09-17 ENCOUNTER — Telehealth (HOSPITAL_COMMUNITY): Payer: Self-pay | Admitting: Emergency Medicine

## 2013-09-17 ENCOUNTER — Inpatient Hospital Stay: Payer: Medicare Other | Admitting: Pulmonary Disease

## 2013-09-17 DIAGNOSIS — I2699 Other pulmonary embolism without acute cor pulmonale: Secondary | ICD-10-CM

## 2013-09-17 LAB — PROTIME-INR: Prothrombin Time: 69.5 s (ref 10.2–12.4)

## 2013-09-17 NOTE — ED Notes (Signed)
Post ED Visit - Positive Culture Follow-up  Culture report reviewed by antimicrobial stewardship pharmacist: []  Wes Dulaney, Pharm.D., BCPS [x]  Celedonio Miyamoto, [address].D., BCPS []  Georgina Pillion, Pharm.D., BCPS []  [place], [place].D., BCPS, AAHIVP []  Estella Husk, Pharm.D., BCPS, AAHIVP  Positive urine culture No treatment needed and no further patient follow-up is required at this time.  Kylie A Holland 09/17/2013, 1:40 PM

## 2013-09-17 NOTE — Progress Notes (Signed)
Labs only

## 2013-09-20 ENCOUNTER — Other Ambulatory Visit: Payer: Self-pay | Admitting: Family Medicine

## 2013-09-20 DIAGNOSIS — M79609 Pain in unspecified limb: Secondary | ICD-10-CM | POA: Diagnosis not present

## 2013-09-20 DIAGNOSIS — IMO0001 Reserved for inherently not codable concepts without codable children: Secondary | ICD-10-CM | POA: Diagnosis not present

## 2013-09-20 DIAGNOSIS — M159 Polyosteoarthritis, unspecified: Secondary | ICD-10-CM | POA: Diagnosis not present

## 2013-09-20 DIAGNOSIS — M19079 Primary osteoarthritis, unspecified ankle and foot: Secondary | ICD-10-CM | POA: Diagnosis not present

## 2013-09-20 DIAGNOSIS — M2550 Pain in unspecified joint: Secondary | ICD-10-CM | POA: Diagnosis not present

## 2013-09-20 DIAGNOSIS — M19049 Primary osteoarthritis, unspecified hand: Secondary | ICD-10-CM | POA: Diagnosis not present

## 2013-09-20 DIAGNOSIS — M5137 Other intervertebral disc degeneration, lumbosacral region: Secondary | ICD-10-CM | POA: Diagnosis not present

## 2013-09-20 NOTE — Telephone Encounter (Signed)
Opened in error

## 2013-09-21 DIAGNOSIS — J61 Pneumoconiosis due to asbestos and other mineral fibers: Secondary | ICD-10-CM | POA: Diagnosis not present

## 2013-09-21 DIAGNOSIS — R05 Cough: Secondary | ICD-10-CM | POA: Diagnosis not present

## 2013-09-21 DIAGNOSIS — G4733 Obstructive sleep apnea (adult) (pediatric): Secondary | ICD-10-CM | POA: Diagnosis not present

## 2013-09-21 DIAGNOSIS — J45909 Unspecified asthma, uncomplicated: Secondary | ICD-10-CM | POA: Diagnosis not present

## 2013-09-21 DIAGNOSIS — J984 Other disorders of lung: Secondary | ICD-10-CM | POA: Diagnosis not present

## 2013-09-21 NOTE — Progress Notes (Signed)
CC:   Linda Massed, MD  DIAGNOSES: 1. Recurrent iron-deficiency anemia. 2. Recent pulmonary embolism. 3. [name]. 4. Congestive heart failure.  CURRENT THERAPY: 1. Coumadin - managed by primary care physician. 2. IV iron as indicated - patient received a dose today.  INTERIM HISTORY:  Linda [name] comes in for followup after I saw her back in July.  At that point in time, we did go ahead and give her some IV iron.  This does seem to help her.  Unfortunately, back in August, she was admitted to [facility]. She initially was found to have a pulmonary embolism.  It is hard to say how this has started.  She was placed on Coumadin.  She is being followed by her family doctor for this.  She does have asbestosis.  She has asthma.  She comes in today with marked shortness of breath.  She was in the ER last night.  She was found to be a little bit anemic with a hemoglobin of 10.4, hematocrit 33.5.  MCV was 97.  Her iron studies showed an iron saturation of 11%.  Ferritin was only 62.  This I suspect is quite low given all of her other issues.  Again, she is mostly bothered by her breathing.  She is on steroids right now.  She is not too happy about being on steroids as they do not allow her to sleep.  She also was found I think to have E coli in the urine.  I will have to see about getting this treated.  She is not on any kind of antibiotic.  She is allergic to penicillins.  We will see about putting her on ciprofloxacin for 2 or 3 days.  She has had some occasional leg swelling.  She has had no rashes.  She has had no nausea.  There has been some dysuria.  There has been no diarrhea.  She has had some cough.  Cough is nonproductive.  Overall, performance status right now is ECOG 2.  PHYSICAL EXAMINATION:  General:  This is a chronically ill-appearing white female in mild distress secondary to pain.  Vital Signs: Temperature 97.9, pulse 100, respiratory rate 18,  blood pressure 145/66. Weight is 180 pounds.  Head and neck:  Normocephalic, atraumatic skull. She is slightly cushingoid.  She has no adenopathy in the neck.  There is no thrush.  There is no scleral icterus.  Lungs:  With decreased breath sounds throughout all lung fields.  She has wheezes bilaterally. She has some crackles bilaterally.  Cardiac:  Tachycardic, but regular. There is an occasional extra beat.  She has a 1/6 systolic ejection murmur.  Abdomen:  Soft.  She has decent bowel sounds.  There is no fluid wave.  There is no palpable hepatosplenomegaly.  Back:  No tenderness over the spine, ribs, or hips.  Extremities:  Show no clubbing, cyanosis, or edema.  No venous cord is noted in her legs. Skin:  No rashes.  LABORATORY DATA:  Of note, back in July, she had Dopplers of her lower legs.  These were negative for any DVT.  Her blood work today shows hemoglobin 6.8, white cell count 6.8, hemoglobin 10.4, hematocrit 33.5, platelet count 185.  Her BUN is 14, creatinine 0.8.  Protime is 3.73.  IMPRESSION:  Linda [name] is a 67 year old white female.  She has multiple comorbid medical problems right now.  Her biggest problem is pulmonary embolism.  She is on Coumadin.  She is certainly therapeutic.  She has urinary tract infection.  I will go ahead and get her on some Levaquin for this.  This hopefully will help eradicate this problem.  We will go ahead and give her iron today.  Hopefully, this might help make her feel a little bit better.  She is very complicated.  We spent about 40 or 45 minutes with her today.  Trying to sort through all the issues and trying to get records from [place], we were finally able to obtain.  I want to see her back in a month.  I just hope that she is able to improve.  Of note, I did get hold of the CT angiogram of the chest.  This showed a tiny subsegmental embolism in left upper lobe.    ______________________________ Linda [name],  M.D. PRE/MEDQ  D:  09/14/2013  T:  09/21/2013  Job:  [id number]

## 2013-09-24 ENCOUNTER — Telehealth: Payer: Self-pay | Admitting: Family Medicine

## 2013-09-24 NOTE — Telephone Encounter (Signed)
Pls call pt and tell her she was supposed to come in for PT/INR earlier this week on Monday 09/20/13. Is she taking her coumadin or did she stop it after her most recent INR check here?  Needs INR check--thx

## 2013-09-24 NOTE — Telephone Encounter (Signed)
Patient states that she did not remember needing an appointment but she has stopped her coumadin from Friday until today she said she will restart it now.

## 2013-09-25 NOTE — Telephone Encounter (Signed)
Noted. (Pt was on levaquin during the most recent INR check (when it was 6+).)

## 2013-09-28 ENCOUNTER — Other Ambulatory Visit (INDEPENDENT_AMBULATORY_CARE_PROVIDER_SITE_OTHER): Payer: Medicare Other

## 2013-09-28 ENCOUNTER — Ambulatory Visit: Payer: Medicare Other | Admitting: *Deleted

## 2013-09-28 DIAGNOSIS — I2699 Other pulmonary embolism without acute cor pulmonale: Secondary | ICD-10-CM

## 2013-09-28 DIAGNOSIS — Z23 Encounter for immunization: Secondary | ICD-10-CM | POA: Diagnosis not present

## 2013-09-28 LAB — PROTIME-INR: Prothrombin Time: 23.3 s — ABNORMAL HIGH (ref 10.2–12.4)

## 2013-09-28 NOTE — Progress Notes (Signed)
Labs only

## 2013-09-30 ENCOUNTER — Telehealth: Payer: Self-pay | Admitting: Family Medicine

## 2013-09-30 MED ORDER — ALPRAZOLAM 1 MG PO TABS: ORAL_TABLET | ORAL | Status: DC

## 2013-09-30 NOTE — Telephone Encounter (Signed)
Rx printed.  Pls notify pt that at their next f/u appt we'll have to have them sign a controlled substances contract as per the new standards set by [place] medical board.-thx

## 2013-09-30 NOTE — Telephone Encounter (Signed)
Patient requesting refill on xanax.   Patient last seen 09/14/13.  Medication last printed 08/31/13.  Please advise refill.

## 2013-10-05 ENCOUNTER — Other Ambulatory Visit (HOSPITAL_BASED_OUTPATIENT_CLINIC_OR_DEPARTMENT_OTHER): Payer: Medicare Other | Admitting: Lab

## 2013-10-05 ENCOUNTER — Other Ambulatory Visit (INDEPENDENT_AMBULATORY_CARE_PROVIDER_SITE_OTHER): Payer: Medicare Other

## 2013-10-05 ENCOUNTER — Ambulatory Visit (HOSPITAL_BASED_OUTPATIENT_CLINIC_OR_DEPARTMENT_OTHER): Payer: Medicare Other | Admitting: Hematology & Oncology

## 2013-10-05 VITALS — BP 146/78 | HR 79 | Temp 98.1°F | Resp 14 | Ht 64.0 in | Wt 179.0 lb

## 2013-10-05 DIAGNOSIS — D509 Iron deficiency anemia, unspecified: Secondary | ICD-10-CM | POA: Diagnosis not present

## 2013-10-05 DIAGNOSIS — I2699 Other pulmonary embolism without acute cor pulmonale: Secondary | ICD-10-CM

## 2013-10-05 LAB — PROTIME-INR
INR: 2.4 ratio — ABNORMAL HIGH (ref 0.8–1.0)
Prothrombin Time: 25.3 s — ABNORMAL HIGH (ref 10.2–12.4)

## 2013-10-05 LAB — CBC WITH DIFFERENTIAL (CANCER CENTER ONLY)
BASO#: 0 10*3/uL (ref 0.0–0.2)
Eosinophils Absolute: 0.2 10*3/uL (ref 0.0–0.5)
HCT: 32.6 % — ABNORMAL LOW (ref 34.8–46.6)
HGB: 10.5 g/dL — ABNORMAL LOW (ref 11.6–15.9)
MCH: 30.3 pg (ref 26.0–34.0)
MONO#: 0.6 10*3/uL (ref 0.1–0.9)
MONO%: 8.6 % (ref 0.0–13.0)
NEUT#: 4.5 10*3/uL (ref 1.5–6.5)
NEUT%: 62.2 % (ref 39.6–80.0)
Platelets: 201 10*3/uL (ref 145–400)
RBC: 3.47 10*6/uL — ABNORMAL LOW (ref 3.70–5.32)
WBC: 7.2 10*3/uL (ref 3.9–10.0)

## 2013-10-05 LAB — IRON AND TIBC CHCC
%SAT: 33 % (ref 21–57)
Iron: 83 ug/dL (ref 41–142)
TIBC: 250 ug/dL (ref 236–444)
UIBC: 167 ug/dL (ref 120–384)

## 2013-10-05 LAB — FERRITIN CHCC: Ferritin: 1691 ng/ml — ABNORMAL HIGH (ref 9–269)

## 2013-10-05 LAB — PROTHROMBIN TIME: INR: 2.25 — ABNORMAL HIGH (ref <1.50)

## 2013-10-05 NOTE — Progress Notes (Signed)
This office note has been dictated.

## 2013-10-05 NOTE — Progress Notes (Signed)
Labs only

## 2013-10-06 NOTE — Progress Notes (Signed)
CC:   Jeoffrey Massed, MD  DIAGNOSES: 1. Recurrent iron-deficiency anemia. 2. Pulmonary embolism, idiopathic. 3. Chronic asbestosis. 4. Congestive heart failure.  CURRENT THERAPY: 1. IV iron as indicated.  The patient last received a dose on     09/14/2013. 2. Coumadin, per primary care.  INTERIM HISTORY:  Ms. Linsley comes in for followup.  She still feels a little bit tired.  We did go ahead and give her some iron back in September.  At that point in time, her ferritin was 62, and her iron saturation was 11%.  Today, her saturation is 33%.  Her ferritin is 1600.  A lot of the ferritin elevation is an acute phase reactant.  She has had no problems with increased shortness of breath.  She does wear oxygen.  She has a lot of knee problems.  She is in need of bilateral knee replacements. She has had no fever.  She has had no bleeding.  There has been no change in bowel or bladder habits.  She continues on quite a few medications.  PHYSICAL EXAMINATION:  General:  This is a slightly ill-appearing white female.  She is in no obvious distress.  Vital signs:  Temperature of 98.1, pulse 79, respiratory rate 14, blood pressure 146/78.  Weight is 179 pounds.  Head/Neck:  Normocephalic, atraumatic skull.  There are no ocular or oral lesions.  There are no palpable cervical or supraclavicular lymph nodes.  Lungs:  Decent breath sounds bilaterally. She has no rales, wheezes, or rhonchi.  Cardiac:  Regular rate and rhythm with a normal S1 and S2.  She has no murmurs, rubs or bruits. Abdomen:  Soft.  She has good bowel sounds.  There is no palpable abdominal mass.  There is no fluid wave.  There is no palpable hepatosplenomegaly.  Extremities:  Some chronic trace nonpitting edema of the lower legs.  She has 4/5 strength bilaterally.  She has crepitus with knee range of motion.  Skin:  No rashes, ecchymoses or petechia. Neurological:  No focal neurological deficits.  LABORATORY  STUDIES:  White cell count is 7.2, hemoglobin 10.5, hematocrit 32.6, platelet count 201. Her ferritin is 1691 with iron saturation of 33%.  Her erythropoietin level is 6.7.  IMPRESSION:  Ms. Mellin is a 67 year old white female.  She does have an iron deficiency anemia.  I also suspect that she has anemia secondary to erythropoietin deficiency.  I think we are probably going to have to consider her for Aranesp.  I believe that this is probably going to be the best way for [place] to try to get her blood count up.  I do not think that she is really iron deficient right now.  We will get her back in later on this week.  I will have to talk to her about getting the Aranesp.  Again, I think this really would help her out to try to get her blood count higher to get her to feel better.  I will go ahead and plan to see her back in another 4-6 weeks.    ______________________________ Josph Macho, M.D. PRE/MEDQ  D:  10/05/2013  T:  10/06/2013  Job:  [id number]

## 2013-10-07 ENCOUNTER — Other Ambulatory Visit: Payer: Medicare Other

## 2013-10-07 ENCOUNTER — Ambulatory Visit (HOSPITAL_BASED_OUTPATIENT_CLINIC_OR_DEPARTMENT_OTHER): Payer: Medicare Other

## 2013-10-07 VITALS — BP 167/85 | HR 81 | Temp 97.1°F | Resp 20

## 2013-10-07 DIAGNOSIS — D509 Iron deficiency anemia, unspecified: Secondary | ICD-10-CM | POA: Diagnosis not present

## 2013-10-07 MED ORDER — SODIUM CHLORIDE 0.9 % IJ SOLN
3.0000 mL | Freq: Once | INTRAMUSCULAR | Status: DC | PRN
  Filled 2013-10-07: qty 10

## 2013-10-07 MED ORDER — METHYLPREDNISOLONE SODIUM SUCC 125 MG IJ SOLR
125.0000 mg | Freq: Once | INTRAMUSCULAR | Status: AC
  Administered 2013-10-07: 125 mg via INTRAVENOUS

## 2013-10-07 MED ORDER — DIPHENHYDRAMINE HCL 50 MG/ML IJ SOLN
INTRAMUSCULAR | Status: AC
  Filled 2013-10-07: qty 1

## 2013-10-07 MED ORDER — METHYLPREDNISOLONE SODIUM SUCC 125 MG IJ SOLR
INTRAMUSCULAR | Status: AC
  Filled 2013-10-07: qty 2

## 2013-10-07 MED ORDER — HEPARIN SOD (PORK) LOCK FLUSH 100 UNIT/ML IV SOLN
500.0000 [IU] | Freq: Once | INTRAVENOUS | Status: DC | PRN
  Filled 2013-10-07: qty 5

## 2013-10-07 MED ORDER — ALTEPLASE 2 MG IJ SOLR
2.0000 mg | Freq: Once | INTRAMUSCULAR | Status: DC | PRN
  Filled 2013-10-07: qty 2

## 2013-10-07 MED ORDER — DIPHENHYDRAMINE HCL 50 MG/ML IJ SOLN
25.0000 mg | Freq: Once | INTRAMUSCULAR | Status: AC
  Administered 2013-10-07: 25 mg via INTRAVENOUS

## 2013-10-07 MED ORDER — SODIUM CHLORIDE 0.9 % IV SOLN
Freq: Once | INTRAVENOUS | Status: AC
Start: 2013-10-07 — End: 2013-10-07
  Administered 2013-10-07: 11:00:00 via INTRAVENOUS

## 2013-10-07 MED ORDER — FERUMOXYTOL INJECTION 510 MG/17 ML
510.0000 mg | Freq: Once | INTRAVENOUS | Status: AC
  Administered 2013-10-07: 510 mg via INTRAVENOUS
  Filled 2013-10-07: qty 17

## 2013-10-07 MED ORDER — SODIUM CHLORIDE 0.9 % IV SOLN: Freq: Once | INTRAVENOUS | Status: DC

## 2013-10-07 NOTE — Patient Instructions (Signed)
Ferumoxytol injection What is this medicine? FERUMOXYTOL is an iron complex. Iron is used to make healthy red blood cells, which carry oxygen and nutrients throughout the body. This medicine is used to treat iron deficiency anemia in people with chronic kidney disease. This medicine may be used for other purposes; ask your health care provider or pharmacist if you have questions. What should I tell my health care provider before I take this medicine? They need to know if you have any of these conditions: -anemia not caused by low iron levels -high levels of iron in the blood -magnetic resonance imaging (MRI) test scheduled -an unusual or allergic reaction to iron, other medicines, foods, dyes, or preservatives -pregnant or trying to get pregnant -breast-feeding How should I use this medicine? This medicine is for infusion into a vein. It is given by a health care professional in a hospital or clinic setting. Talk to your pediatrician regarding the use of this medicine in children. Special care may be needed. Overdosage: If you think you've taken too much of this medicine contact a poison control center or emergency room at once. Overdosage: If you think you have taken too much of this medicine contact a poison control center or emergency room at once. NOTE: This medicine is only for you. Do not share this medicine with others. What if I miss a dose? It is important not to miss your dose. Call your doctor or health care professional if you are unable to keep an appointment. What may interact with this medicine? This medicine may interact with the following medications: -other iron products This list may not describe all possible interactions. Give your health care provider a list of all the medicines, herbs, non-prescription drugs, or dietary supplements you use. Also tell them if you smoke, drink alcohol, or use illegal drugs. Some items may interact with your medicine. What should I watch  for while using this medicine? Visit your doctor or healthcare professional regularly. Tell your doctor or healthcare professional if your symptoms do not start to get better or if they get worse. You may need blood work done while you are taking this medicine. You may need to follow a special diet. Talk to your doctor. Foods that contain iron include: whole grains/cereals, dried fruits, beans, or peas, leafy green vegetables, and organ meats (liver, kidney). What side effects may I notice from receiving this medicine? Side effects that you should report to your doctor or health care professional as soon as possible: -allergic reactions like skin rash, itching or hives, swelling of the face, lips, or tongue -breathing problems -changes in blood pressure -feeling faint or lightheaded, falls -fever or chills -flushing, sweating, or hot feelings -swelling of the ankles or feet Side effects that usually do not require medical attention (Report these to your doctor or health care professional if they continue or are bothersome.): -diarrhea -headache -nausea, vomiting -stomach pain This list may not describe all possible side effects. Call your doctor for medical advice about side effects. You may report side effects to FDA at 1-800-FDA-1088. Where should I keep my medicine? This drug is given in a hospital or clinic and will not be stored at home. NOTE: This sheet is a summary. It may not cover all possible information. If you have questions about this medicine, talk to your doctor, pharmacist, or health care provider.  2013, [employer]. (08/31/2008 9:48:25 PM)  

## 2013-10-09 LAB — TRANSFERRIN RECEPTOR, SOLUABLE: Transferrin Receptor, Soluble: 1.86 mg/L — ABNORMAL HIGH (ref 0.76–1.76)

## 2013-10-11 DIAGNOSIS — R5381 Other malaise: Secondary | ICD-10-CM | POA: Diagnosis not present

## 2013-10-11 DIAGNOSIS — M5137 Other intervertebral disc degeneration, lumbosacral region: Secondary | ICD-10-CM | POA: Diagnosis not present

## 2013-10-11 DIAGNOSIS — IMO0001 Reserved for inherently not codable concepts without codable children: Secondary | ICD-10-CM | POA: Diagnosis not present

## 2013-10-11 DIAGNOSIS — M25569 Pain in unspecified knee: Secondary | ICD-10-CM | POA: Diagnosis not present

## 2013-10-11 DIAGNOSIS — M159 Polyosteoarthritis, unspecified: Secondary | ICD-10-CM | POA: Diagnosis not present

## 2013-10-11 DIAGNOSIS — M2550 Pain in unspecified joint: Secondary | ICD-10-CM | POA: Diagnosis not present

## 2013-10-12 NOTE — Telephone Encounter (Signed)
That's when patient was supposed to come in for PT/INR.   Patient aware.

## 2013-10-12 NOTE — Telephone Encounter (Signed)
Patient called in to make sure she is okay to wait til the 28th for her next INR. She had her last INR check 10/05/13. Please advise patient.

## 2013-10-13 DIAGNOSIS — J45909 Unspecified asthma, uncomplicated: Secondary | ICD-10-CM | POA: Diagnosis not present

## 2013-10-18 ENCOUNTER — Encounter: Payer: Self-pay | Admitting: Family Medicine

## 2013-10-18 DIAGNOSIS — G894 Chronic pain syndrome: Secondary | ICD-10-CM | POA: Diagnosis not present

## 2013-10-18 DIAGNOSIS — M5137 Other intervertebral disc degeneration, lumbosacral region: Secondary | ICD-10-CM | POA: Diagnosis not present

## 2013-10-18 DIAGNOSIS — Z79899 Other long term (current) drug therapy: Secondary | ICD-10-CM | POA: Diagnosis not present

## 2013-10-18 DIAGNOSIS — M5030 Other cervical disc degeneration, unspecified cervical region: Secondary | ICD-10-CM | POA: Diagnosis not present

## 2013-10-19 ENCOUNTER — Other Ambulatory Visit (INDEPENDENT_AMBULATORY_CARE_PROVIDER_SITE_OTHER): Payer: Medicare Other

## 2013-10-19 DIAGNOSIS — I2699 Other pulmonary embolism without acute cor pulmonale: Secondary | ICD-10-CM | POA: Diagnosis not present

## 2013-10-19 LAB — PROTIME-INR: Prothrombin Time: 34.4 s — ABNORMAL HIGH (ref 10.2–12.4)

## 2013-10-19 NOTE — Progress Notes (Signed)
Labs only

## 2013-10-20 DIAGNOSIS — J984 Other disorders of lung: Secondary | ICD-10-CM | POA: Diagnosis not present

## 2013-10-20 DIAGNOSIS — J45909 Unspecified asthma, uncomplicated: Secondary | ICD-10-CM | POA: Diagnosis not present

## 2013-10-20 DIAGNOSIS — R05 Cough: Secondary | ICD-10-CM | POA: Diagnosis not present

## 2013-10-20 DIAGNOSIS — G4733 Obstructive sleep apnea (adult) (pediatric): Secondary | ICD-10-CM | POA: Diagnosis not present

## 2013-10-20 DIAGNOSIS — J61 Pneumoconiosis due to asbestos and other mineral fibers: Secondary | ICD-10-CM | POA: Diagnosis not present

## 2013-10-21 ENCOUNTER — Other Ambulatory Visit: Payer: Self-pay | Admitting: Family Medicine

## 2013-10-21 MED ORDER — NEBIVOLOL HCL 5 MG PO TABS: 5.0000 mg | ORAL_TABLET | Freq: Every day | ORAL | Status: DC

## 2013-10-25 ENCOUNTER — Other Ambulatory Visit: Payer: Self-pay | Admitting: Family Medicine

## 2013-10-25 MED ORDER — NEBIVOLOL HCL 5 MG PO TABS: 5.0000 mg | ORAL_TABLET | Freq: Every day | ORAL | Status: DC

## 2013-10-25 NOTE — Telephone Encounter (Signed)
Pharmacy requesting refill for bystolic.  It was already sent in on 10/21/13 but they did not get it so I will send medication again.

## 2013-11-02 ENCOUNTER — Other Ambulatory Visit: Payer: Self-pay | Admitting: Family Medicine

## 2013-11-02 MED ORDER — ISOSORBIDE MONONITRATE ER 30 MG PO TB24: 30.0000 mg | ORAL_TABLET | Freq: Every day | ORAL | Status: DC

## 2013-11-11 ENCOUNTER — Other Ambulatory Visit: Payer: Self-pay | Admitting: Family Medicine

## 2013-11-11 DIAGNOSIS — I2699 Other pulmonary embolism without acute cor pulmonale: Secondary | ICD-10-CM

## 2013-11-11 LAB — PROTIME-INR: INR: 3.63 — ABNORMAL HIGH (ref <1.50)

## 2013-11-12 ENCOUNTER — Other Ambulatory Visit: Payer: Self-pay | Admitting: Family Medicine

## 2013-11-12 DIAGNOSIS — I2699 Other pulmonary embolism without acute cor pulmonale: Secondary | ICD-10-CM

## 2013-11-12 MED ORDER — FUROSEMIDE 20 MG PO TABS: 20.0000 mg | ORAL_TABLET | Freq: Every day | ORAL | Status: DC

## 2013-11-16 ENCOUNTER — Other Ambulatory Visit: Payer: Medicare Other | Admitting: Lab

## 2013-11-16 ENCOUNTER — Telehealth: Payer: Self-pay | Admitting: Hematology & Oncology

## 2013-11-16 ENCOUNTER — Ambulatory Visit: Payer: Medicare Other | Admitting: Hematology & Oncology

## 2013-11-16 NOTE — Telephone Encounter (Signed)
Pt aware of 12-18 appointment °

## 2013-11-22 DIAGNOSIS — M2550 Pain in unspecified joint: Secondary | ICD-10-CM | POA: Diagnosis not present

## 2013-11-22 DIAGNOSIS — M5137 Other intervertebral disc degeneration, lumbosacral region: Secondary | ICD-10-CM | POA: Diagnosis not present

## 2013-11-22 DIAGNOSIS — Z79899 Other long term (current) drug therapy: Secondary | ICD-10-CM | POA: Diagnosis not present

## 2013-11-22 DIAGNOSIS — G894 Chronic pain syndrome: Secondary | ICD-10-CM | POA: Diagnosis not present

## 2013-11-22 DIAGNOSIS — IMO0001 Reserved for inherently not codable concepts without codable children: Secondary | ICD-10-CM | POA: Diagnosis not present

## 2013-11-22 DIAGNOSIS — M5030 Other cervical disc degeneration, unspecified cervical region: Secondary | ICD-10-CM | POA: Diagnosis not present

## 2013-11-22 DIAGNOSIS — M159 Polyosteoarthritis, unspecified: Secondary | ICD-10-CM | POA: Diagnosis not present

## 2013-11-25 ENCOUNTER — Other Ambulatory Visit: Payer: Self-pay | Admitting: *Deleted

## 2013-11-25 MED ORDER — ALPRAZOLAM 1 MG PO TABS: ORAL_TABLET | ORAL | Status: DC

## 2013-11-25 NOTE — Telephone Encounter (Signed)
Refill request for Xanax Last filled by MD on - 09/30/13 #90 x1 Last Appt- 09/08/13 Next Appt- none Please advise refill? Patient should have 15 pills left.

## 2013-11-29 NOTE — Progress Notes (Signed)
CC:   Linda Massed, MD  DIAGNOSES: 1. Recurrent iron-deficiency anemia. 2. Recent pulmonary embolism. 3. [name]. 4. Congestive heart failure.  CURRENT THERAPY: 1. Coumadin - managed by primary care physician. 2. IV iron as indicated - the patient received a dose today.  INTERIM HISTORY:  Linda [name] comes in for followup.  I first saw her back in July.  At that point in time, we did go ahead and give her some IV iron.  This did seem to help her.  Unfortunately back in August, she was admitted to [facility].  She initially was found to have a pulmonary embolism.  It is hard to say how this had started.  She was placed on Coumadin.  She is being followed by her family doctor for this.  She does have asbestosis.  She has asthma.  She comes in today with marked shortness of breath.  She was in the ER last night.  She was found to be a little bit anemic with a hemoglobin of 10.4, hematocrit 33.5.  MCV was 97.  Her iron studies showed an iron saturation of 11%.  Her ferritin was only 62. This, I suspect, is quite low given all of her other issues.  Again, she is mostly bothered by her breathing.  She is on steroids right now.  She is not too happy about being on steroids as they do not allow her to sleep.  She also was found, I think, to have E. coli in the urine.  We will have to see about getting this treated.  She is not on any kind of antibiotic.  She is allergic to penicillins.  We will see about putting her on ciprofloxacin for 2 or 3 days.  She has had some occasional leg swelling.  She has had no rashes.  She has had no nausea.  There has been some dysuria.  There has been no diarrhea.  She has had some cough.  Cough is nonproductive.  Overall, her performance status right now is ECOG 2.  PHYSICAL EXAMINATION:  General:  This is a chronically ill appearing white female, in mild distress secondary to pain.  Vital Signs: Temperature of 97.9, pulse 100,  respiratory rate 18, blood pressure 145/66.  Weight is 180 pounds.  Head and Neck:  Normocephalic, atraumatic skull.  She is slightly cushingoid.  She has no adenopathy in the neck.  There is no thrush.  There is no scleral icterus.  Lungs: With decreased breath sounds throughout all lung fields.  She has wheezes bilaterally.  She has some crackles bilaterally.  Cardiac: Tachycardic but regular.  There is an occasional extra beat.  She has a 1/6 systolic ejection murmur.  Abdomen:  Soft.  She has decent bowel sounds.  There is no fluid wave.  There is no palpable hepatosplenomegaly.  Back:  No tenderness over the spine, ribs, or hips. Extremities:  No clubbing, cyanosis, or edema.  No venous cords noted in her legs.  Skin:  No rashes.  LABORATORY STUDIES:  Of note, back in July, she had Dopplers of her lower legs.  These were negative for any DVT.  Her blood work today shows white cell count 6.8, hemoglobin 10.4, hematocrit 33.5, platelet count 185.  Her BUN is 14, creatinine is 0.8.  Prothrombin time is 3.73.  IMPRESSION:  Linda [name] is a 67 year old white female.  She has multiple medical problems right now.  Her biggest problem is pulmonary embolism.  She is on Coumadin.  She certainly is therapeutic.  She has a urinary tract infection.  I will go ahead and get her on some Levaquin for this. This hopefully will help eradicate this problem.  We will go ahead and give her some iron today.  Hopefully, this might help make her feel a bit better.  She is very complicated.  We spent about 45 minutes with her today trying to sort through all the issues and trying to get records from [place], we were finally able to obtain.  I want to see her back in a month.  I just hope that she is able to improve.  Of note, I did get hold of the CT angiogram of the chest.  This showed a tiny subsegmental embolism in the left upper lobe.    ______________________________ Linda [name],  M.D. PRE/MEDQ  D:  09/14/2013  T:  11/28/2013  Job:  [id number]

## 2013-12-08 ENCOUNTER — Ambulatory Visit (HOSPITAL_BASED_OUTPATIENT_CLINIC_OR_DEPARTMENT_OTHER)
Admission: RE | Admit: 2013-12-08 | Discharge: 2013-12-08 | Disposition: A | Discharge status: Home / Self Care | Payer: Medicare Other | Source: Ambulatory Visit | Attending: Osteopathic Medicine | Admitting: Osteopathic Medicine

## 2013-12-08 ENCOUNTER — Telehealth: Payer: Self-pay | Admitting: Family Medicine

## 2013-12-08 ENCOUNTER — Other Ambulatory Visit (HOSPITAL_BASED_OUTPATIENT_CLINIC_OR_DEPARTMENT_OTHER): Payer: Self-pay | Admitting: Osteopathic Medicine

## 2013-12-08 DIAGNOSIS — M47819 Spondylosis without myelopathy or radiculopathy, site unspecified: Secondary | ICD-10-CM

## 2013-12-08 DIAGNOSIS — M47817 Spondylosis without myelopathy or radiculopathy, lumbosacral region: Secondary | ICD-10-CM | POA: Insufficient documentation

## 2013-12-08 DIAGNOSIS — M542 Cervicalgia: Secondary | ICD-10-CM | POA: Diagnosis not present

## 2013-12-08 DIAGNOSIS — M479 Spondylosis, unspecified: Secondary | ICD-10-CM

## 2013-12-08 DIAGNOSIS — M5137 Other intervertebral disc degeneration, lumbosacral region: Secondary | ICD-10-CM | POA: Insufficient documentation

## 2013-12-08 DIAGNOSIS — M47812 Spondylosis without myelopathy or radiculopathy, cervical region: Secondary | ICD-10-CM | POA: Diagnosis not present

## 2013-12-08 DIAGNOSIS — IMO0002 Reserved for concepts with insufficient information to code with codable children: Secondary | ICD-10-CM

## 2013-12-08 DIAGNOSIS — M545 Low back pain, unspecified: Secondary | ICD-10-CM | POA: Insufficient documentation

## 2013-12-08 DIAGNOSIS — M5030 Other cervical disc degeneration, unspecified cervical region: Secondary | ICD-10-CM | POA: Diagnosis not present

## 2013-12-08 DIAGNOSIS — M51379 Other intervertebral disc degeneration, lumbosacral region without mention of lumbar back pain or lower extremity pain: Secondary | ICD-10-CM | POA: Insufficient documentation

## 2013-12-08 DIAGNOSIS — I2699 Other pulmonary embolism without acute cor pulmonale: Secondary | ICD-10-CM

## 2013-12-08 NOTE — Telephone Encounter (Signed)
Pls call pt and remind her that she is due for a repeat PT/INR (already ordered and released for [facility])-thx

## 2013-12-09 ENCOUNTER — Other Ambulatory Visit (HOSPITAL_BASED_OUTPATIENT_CLINIC_OR_DEPARTMENT_OTHER): Payer: Medicare Other | Admitting: Lab

## 2013-12-09 ENCOUNTER — Ambulatory Visit (HOSPITAL_BASED_OUTPATIENT_CLINIC_OR_DEPARTMENT_OTHER): Payer: Medicare Other | Admitting: Hematology & Oncology

## 2013-12-09 ENCOUNTER — Ambulatory Visit (HOSPITAL_BASED_OUTPATIENT_CLINIC_OR_DEPARTMENT_OTHER)
Admission: RE | Admit: 2013-12-09 | Discharge: 2013-12-09 | Disposition: A | Discharge status: Home / Self Care | Payer: Medicare Other | Source: Ambulatory Visit | Attending: Hematology & Oncology | Admitting: Hematology & Oncology

## 2013-12-09 VITALS — BP 147/72 | HR 85 | Temp 97.9°F | Resp 14 | Ht 64.0 in | Wt 183.0 lb

## 2013-12-09 DIAGNOSIS — M7989 Other specified soft tissue disorders: Secondary | ICD-10-CM | POA: Diagnosis not present

## 2013-12-09 DIAGNOSIS — D509 Iron deficiency anemia, unspecified: Secondary | ICD-10-CM

## 2013-12-09 DIAGNOSIS — I2699 Other pulmonary embolism without acute cor pulmonale: Secondary | ICD-10-CM | POA: Diagnosis not present

## 2013-12-09 DIAGNOSIS — D649 Anemia, unspecified: Secondary | ICD-10-CM | POA: Diagnosis not present

## 2013-12-09 LAB — CBC WITH DIFFERENTIAL (CANCER CENTER ONLY)
BASO#: 0 10*3/uL (ref 0.0–0.2)
EOS%: 1 % (ref 0.0–7.0)
Eosinophils Absolute: 0.1 10*3/uL (ref 0.0–0.5)
HGB: 10.3 g/dL — ABNORMAL LOW (ref 11.6–15.9)
LYMPH%: 28.1 % (ref 14.0–48.0)
MCH: 29.3 pg (ref 26.0–34.0)
MCHC: 31.9 g/dL — ABNORMAL LOW (ref 32.0–36.0)
MONO%: 11.1 % (ref 0.0–13.0)
NEUT#: 2.8 10*3/uL (ref 1.5–6.5)
RDW: 13.2 % (ref 11.1–15.7)

## 2013-12-09 LAB — RETICULOCYTES (CHCC)
ABS Retic: 69.5 10*3/uL (ref 19.0–186.0)
Retic Ct Pct: 1.9 % (ref 0.4–2.3)

## 2013-12-09 LAB — IRON AND TIBC CHCC
%SAT: 14 % — ABNORMAL LOW (ref 21–57)
Iron: 34 ug/dL — ABNORMAL LOW (ref 41–142)
UIBC: 207 ug/dL (ref 120–384)

## 2013-12-09 LAB — FERRITIN CHCC: Ferritin: 459 ng/ml — ABNORMAL HIGH (ref 9–269)

## 2013-12-09 NOTE — Progress Notes (Signed)
This office note has been dictated.

## 2013-12-09 NOTE — Telephone Encounter (Signed)
Left message with patient's husband to remind patient to get her lab work done.

## 2013-12-10 ENCOUNTER — Telehealth: Payer: Self-pay | Admitting: Hematology & Oncology

## 2013-12-10 NOTE — Telephone Encounter (Signed)
Left message with 12-23 appointment

## 2013-12-10 NOTE — Progress Notes (Signed)
DIAGNOSES: 1. Recurrent iron -- deficiency anemia. 2. Pulmonary embolism -- idiopathic. 3. Congestive heart failure -- on oxygen. 4. Chronic asbestosis.  CURRENT THERAPY: 1. IV iron as indicated. 2. Coumadin -- duration likely 1 year or more.  INTERIM HISTORY:  Linda [name] comes in for followup.  Seems like every time we see her, she keeps declining.  She is having no other issues.  Of note, she had a pulmonary embolism back in August of this year.  This was diagnosed at [facility].  She is on Coumadin.  I will probably keep her on Coumadin for least 1-1/2 to 2 years.  She is having a lot of arthritic issues now.  She recently had cervical spine and lumbar spine x-rays.  Both of these showed pronounced degenerative changes.  She had arthritic issues.  She had some spondylolisthesis.  I am not sure who she is seeing for all of her pain issues.  She has had some swelling in her left leg.  She stated this has been going on and off for a year.  She has had no bleeding.  She just feels tired.  As far as her anemia goes, her iron saturation is 14%.  Her ferritin is 459 today.  When we saw her initially, her erythropoietin level was only 6.3.  As such, I thought that she might need some Aranesp or Procrit at some point.  Again, she just feels very weak.  She feels tired.  She has gained some weight.  She just does not do a lot.  She has issues with her husband who has mesothelioma.  A son has multiple sclerosis.  She has knee problems.  She sees Rheumatology.  She says that she has her knees drained.  She has had no bleeding.  She has had no fever.  She has had no rashes.  She is on quite a few medications.  She is with fibromyalgia.  She is on BuTrans patch.  She is on Seroquel and Desyrel.  She also takes Xanax and Elavil.  She is on albuterol inhalers.  PHYSICAL EXAMINATION:  General:  This is a chronically ill-appearing, somewhat pale, white female, in no obvious  distress.  Vital Signs: Temperature of 97.9, pulse 85, respiratory rate 14, blood pressure 147/72.  Weight is 183 pounds.  Head and Neck:  Normocephalic, atraumatic skull.  There is no scleral icterus.  She has no adenopathy in the neck.  Lungs:  Clear bilaterally.  She may have some slight decrease at the bases.  Cardiac:  Regular rate and rhythm with an occasional extra beat.  She has a 1/6 systolic ejection murmur. Abdomen:  Soft.  She has decent bowel sounds.  There is no fluid wave. There is no palpable abdominal mass.  There is no palpable hepatosplenomegaly.  Back:  Some tenderness throughout the spine.  She has some spasms in the paravertebral muscles.  She has tenderness over her hips.  Extremities:  Some 1+ nonpitting edema of the left lower leg. She has knee swelling bilaterally.  Right knee has more swelling than the left knee.  There is no edema of the right leg.  She has decent pulses in her distal extremities.  I cannot palpate a venous cord.  She has a negative Homans sign.  Skin:  No rashes.  Neurological:  No focal neurological deficits.  LABORATORY STUDIES:  White cell count is 4.8, hemoglobin 10.3, hematocrit 32.3, platelet count 198.  Her ferritin is 459 with an iron saturation of 14%.  Iron is 34.  Her corrective reticulocyte count is 1.4.  Her blood smear, there is some mild anisocytosis and poikilocytosis. She has some hypochromic red cells.  I see no nucleated red cells. There is no target cells.  I see no schistocytes.  White cells appear normal in morphology maturation.  She has no immature myeloid or lymphoid forms.  I see no blasts.  Her platelets are adequate in number and size.  Platelets are well granulated.  IMPRESSION:  Linda [name] is a very nice 67 year old white female.  She has an incredible amount of issues right now.  We did send her down for a Doppler of her left leg.  This did show a good sized Baker cyst.  She did not have a deep venous  thromboembolism. There was some suggestion of a superficial thrombophlebitis in a branch of a posterior tibial vein.  Of note, it essentially showed this really signifies.  I do not see any obvious thrombophlebitis changes on her left leg.  The Baker cyst might be the source for this swelling in the left leg.  She did not see an orthopedic doctor as far as I can tell.  We will see about referring her to Dr. Jerl Santos for this Baker cyst.  I do not know if this could be drained.  I taught her at length about starting ESA.  Again, her erythropoietin level when we checked her before was very low.  I told her that we can give her iron, but without Aranesp or Procrit, the bone marrow still will not be able to produce more red cells.  I explained her that there is less than 5% risk of a cerebrovascular event or cardiac event.  She is already on Coumadin.  She is going to have to think about this.  She just looks like she is having a tough time.  Again, she has a lot going on both with herself and with her family.  I told her to try a warm moist compress for her left lower leg.  She is on Coumadin.  She has been therapeutic as far as I could tell with the Coumadin.  We will see about getting her in next week.  I want to give her iron.  I will talk to her about the possibility of Procrit or Aranesp.  I think this will help her.  We will see if Dr. Jerl Santos can see her.  We spent over an hour and a half with her today.  I think that she appreciated [place] of taking all this time with her as she has had a lot of frustrations with not being able to get some problems resolved.    ______________________________ Josph Macho, M.D. PRE/MEDQ  D:  12/09/2013  T:  12/10/2013  Job:  [id number]

## 2013-12-14 ENCOUNTER — Ambulatory Visit: Payer: Medicare Other

## 2013-12-14 ENCOUNTER — Ambulatory Visit (HOSPITAL_BASED_OUTPATIENT_CLINIC_OR_DEPARTMENT_OTHER): Payer: Medicare Other | Admitting: Hematology & Oncology

## 2013-12-14 ENCOUNTER — Other Ambulatory Visit (HOSPITAL_BASED_OUTPATIENT_CLINIC_OR_DEPARTMENT_OTHER): Payer: Medicare Other | Admitting: Lab

## 2013-12-14 VITALS — BP 123/56 | HR 81 | Temp 98.1°F | Resp 14 | Ht 64.0 in | Wt 173.0 lb

## 2013-12-14 DIAGNOSIS — I2699 Other pulmonary embolism without acute cor pulmonale: Secondary | ICD-10-CM | POA: Diagnosis not present

## 2013-12-14 DIAGNOSIS — D509 Iron deficiency anemia, unspecified: Secondary | ICD-10-CM

## 2013-12-14 DIAGNOSIS — I808 Phlebitis and thrombophlebitis of other sites: Secondary | ICD-10-CM | POA: Diagnosis not present

## 2013-12-14 LAB — CBC WITH DIFFERENTIAL (CANCER CENTER ONLY)
BASO#: 0 10*3/uL (ref 0.0–0.2)
BASO%: 0.5 % (ref 0.0–2.0)
EOS%: 1.9 % (ref 0.0–7.0)
HCT: 36.9 % (ref 34.8–46.6)
HGB: 11.8 g/dL (ref 11.6–15.9)
LYMPH%: 22.2 % (ref 14.0–48.0)
MCH: 29.3 pg (ref 26.0–34.0)
MCHC: 32 g/dL (ref 32.0–36.0)
MONO#: 0.5 10*3/uL (ref 0.1–0.9)
MONO%: 12.8 % (ref 0.0–13.0)
NEUT%: 62.6 % (ref 39.6–80.0)
RBC: 4.03 10*6/uL (ref 3.70–5.32)
RDW: 13 % (ref 11.1–15.7)

## 2013-12-14 LAB — CHCC SATELLITE - SMEAR

## 2013-12-14 LAB — PROTIME-INR (CHCC SATELLITE)
INR: 2.3 (ref 2.0–3.5)
Protime: 27.6 Seconds — ABNORMAL HIGH (ref 10.6–13.4)

## 2013-12-14 NOTE — Progress Notes (Signed)
No iron therapy needed today per Dr. Myna Hidalgo.

## 2013-12-15 ENCOUNTER — Telehealth: Payer: Self-pay | Admitting: Hematology & Oncology

## 2013-12-15 DIAGNOSIS — M1710 Unilateral primary osteoarthritis, unspecified knee: Secondary | ICD-10-CM | POA: Diagnosis not present

## 2013-12-15 LAB — COMPREHENSIVE METABOLIC PANEL
AST: 23 U/L (ref 0–37)
Albumin: 4.2 g/dL (ref 3.5–5.2)
Alkaline Phosphatase: 75 U/L (ref 39–117)
BUN: 18 mg/dL (ref 6–23)
CO2: 29 mEq/L (ref 19–32)
Creatinine, Ser: 1.04 mg/dL (ref 0.50–1.10)
Glucose, Bld: 105 mg/dL — ABNORMAL HIGH (ref 70–99)
Total Bilirubin: 0.5 mg/dL (ref 0.3–1.2)

## 2013-12-15 LAB — RETICULOCYTES (CHCC)
ABS Retic: 57.3 10*3/uL (ref 19.0–186.0)
RBC.: 4.09 MIL/uL (ref 3.87–5.11)
Retic Ct Pct: 1.4 % (ref 0.4–2.3)

## 2013-12-15 NOTE — Progress Notes (Signed)
This office note has been dictated.

## 2013-12-15 NOTE — Telephone Encounter (Signed)
Pt aware of 1-8 and 21st appointments will get schedule when she comes in

## 2013-12-15 NOTE — Progress Notes (Signed)
CC:   Jeoffrey Massed, MD  DIAGNOSES: 1. Idiopathic pulmonary embolism. 2. Recurrent iron-deficiency anemia. 3. Chronic congestive heart failure. 4. [name].  CURRENT THERAPY: 1. IV iron as indicated. 2. Coumadin-2-year duration.  INTERIM HISTORY:  Ms. Schellinger comes in back for followup.  When we saw her a week ago, she was not doing well at all.  She was having left leg swelling.  We did a Doppler.  This showed some superficial thrombophlebitis.  We did iron studies on her.  Her ferritin was 459 with iron saturation of 14%.  She has a lot of arthritic issues.  She does not see an Investment banker, operational.  She, in all honesty, does not like to see her family doctor.  I am not too sure as to why this is.  She is on Coumadin.  Today, her INR is 2.3.  She has a hard time standing up.  Her knees are arthritic.  Again, she really needs to see an Orthopedic Surgeon.  She is having problems at home.  There is a lot going on with Ms. Ohagan.  She needs to have a home health physical therapy.  We will see if we can order this for her.  Her performance status right now is ECOG 3-4.  PHYSICAL EXAMINATION:  General:  This is a chronically ill-appearing white female in no obvious distress.  Vital Signs:  Temperature of 98.1, pulse 81, respiratory rate 14, blood pressure 123/56, weight is 173 pounds.  Head and Neck:  Normocephalic, atraumatic skull.  There are no ocular or oral lesions.  She has no mucositis.  She has no scleral icterus.  There is no adenopathy on her neck.  Lungs:  With decreased breath sounds throughout all lung fields.  She has some crackles bilaterally.  Cardiac:  Regular rate and rhythm with a normal S1, S2. There are no murmurs, rubs, or bruits.  Abdomen:  Soft.  She has good bowel sounds.  There is no fluid wave.  There is no palpable abdominal mass.  There is no palpable hepatosplenomegaly.  Back:  No tenderness over the spine.  Extremities:  Decreased edema  in her left leg.  No venous cord is noted.  She has marked decreased range of motion of her knees.  Skin:  Shows some slight pale skin.  She has no ecchymosis or petechia.  Neurological:  No focal neurological deficits.  LABORATORY STUDIES:  White cell count is 4.2, hemoglobin 11.8, hematocrit 36.9, platelet count 216.  MCV is 92.  D-dimer is 1.78.  INR is 2.3.  IMPRESSION:  Ms. Busser is a 67 year old female.  She has a lot of health issues.  Again, she does not want to see her family doctor.  I am not sure as to what the problem is with this.  We will try to help her out.  Again, I encouraged her to go back to see Dr. Milinda Cave.  He knows what he is doing.  He is a very caring doctor. Hopefully, she will.  She needs physical therapy.  I doubt if she would be able to make it to outpatient physical therapy given her poor state.  She needs to be seen by Orthopedic Surgery.  She has seen a rheumatologist this week for knee injections.  We will have her INR followed every 2 weeks.  She currently I think is on Coumadin at 5 mg a day.  I will see her back myself in another couple of months.  I was plan to  give her iron today, but her hemoglobin has improved nicely, so I think we can hold off on this.  I spent a good 45 minutes with her today, trying to help her with her problems.    ______________________________ Josph Macho, M.D. PRE/MEDQ  D:  12/15/2013  T:  12/15/2013  Job:  [id number]

## 2013-12-20 ENCOUNTER — Ambulatory Visit (INDEPENDENT_AMBULATORY_CARE_PROVIDER_SITE_OTHER): Payer: Medicare Other | Admitting: Family Medicine

## 2013-12-20 ENCOUNTER — Encounter: Payer: Self-pay | Admitting: Family Medicine

## 2013-12-20 VITALS — BP 133/74 | HR 79 | Temp 99.0°F | Resp 18 | Ht 63.0 in | Wt 178.0 lb

## 2013-12-20 DIAGNOSIS — N10 Acute tubulo-interstitial nephritis: Secondary | ICD-10-CM

## 2013-12-20 DIAGNOSIS — N390 Urinary tract infection, site not specified: Secondary | ICD-10-CM | POA: Insufficient documentation

## 2013-12-20 DIAGNOSIS — R300 Dysuria: Secondary | ICD-10-CM

## 2013-12-20 LAB — POCT URINALYSIS DIPSTICK
Bilirubin, UA: NEGATIVE
Blood, UA: NEGATIVE
Glucose, UA: NEGATIVE
Ketones, UA: NEGATIVE
Nitrite, UA: NEGATIVE

## 2013-12-20 MED ORDER — CIPROFLOXACIN HCL 500 MG PO TABS: 500.0000 mg | ORAL_TABLET | Freq: Two times a day (BID) | ORAL | Status: DC

## 2013-12-20 NOTE — Progress Notes (Signed)
OFFICE NOTE  12/20/2013  CC:  Chief Complaint  Patient presents with  . Dysuria    since Friday     HPI: Patient is a 67 y.o. Caucasian female who is here for pain in the kidney area.   Approx 6 days of painful urination, urinary urgency and frequency, foul smelling urine, no gross hematuria, pain in right kidney area that radiates into right flank and groin.  Tm 100.  Some nausea but no vomiting.  Hydrating adequately per pt report.  She has a remote hx of kidney stones and a hx of pyelonephritis. Says she is taking a whole 5mg  coumadin T/Th/Sat, 1/2 tab on other days.  PMH:  HTN Diastolic HF Fibromyalgia Osteoarthritis, multiple joints Hx of narcotic pain med abuse Physical deconditioning Polypharmacy Idiopathic PE Hx of iron def anemia Insomnia Anxiety and depression Recent left lower leg superfical thrombophelbitis  Past Surgical History  Procedure Laterality Date  . Appendectomy  1960  . Total abdominal hysterectomy  1974  . Tendon release  1996    Right forearm and hand  . Knee surgery  2005  . Heel spur surgery Left 2008  . Plantar fascia release Left 2008  . Axillary surgery Left 1978    Multiple "lump" in armpit per pt  . Coccyx removal  1972  . Cardiac catheterization  01/2013    nonobstructive CAD, EF 55-60%  . Transthoracic echocardiogram  01/2013    NORMAL  . Dilation and curettage of uterus  ? 1970's  . Eye surgery Left 2012-2013    "injections for ~ 1 yr; don't really know what for" (07/12/2013)    MEDS:  Outpatient Prescriptions Prior to Visit  Medication Sig Dispense Refill  . albuterol (PROVENTIL HFA;VENTOLIN HFA) 108 (90 BASE) MCG/ACT inhaler 2 puffs.      . ALPRAZolam (XANAX) 1 MG tablet 1/2-1 tab po tid as needed for anxiety or sleep  90 tablet  1  . amitriptyline (ELAVIL) 100 MG tablet Take 1 tablet (100 mg total) by mouth at bedtime. Take 100 mg by mouth nightly.  30 tablet  6  . atorvastatin (LIPITOR) 20 MG tablet Take 1 tablet (20 mg  total) by mouth daily at 6 PM.  30 tablet  6  . furosemide (LASIX) 20 MG tablet Take 1 tablet (20 mg total) by mouth daily.  30 tablet  3  . isosorbide mononitrate (IMDUR) 30 MG 24 hr tablet Take 1 tablet (30 mg total) by mouth daily.  30 tablet  3  . meloxicam (MOBIC) 7.5 MG tablet Take 7.5 mg by mouth 2 (two) times daily.       . Milnacipran (SAVELLA) 50 MG TABS tablet Take 1 tablet (50 mg total) by mouth 2 (two) times daily.  60 tablet  3  . nebivolol (BYSTOLIC) 5 MG tablet Take 1 tablet (5 mg total) by mouth daily.  30 tablet  2  . nitroGLYCERIN (NITROSTAT) 0.4 MG SL tablet Place 1 tablet (0.4 mg total) under the tongue every 5 (five) minutes x 3 doses as needed for chest pain.  60 tablet  3  . pregabalin (LYRICA) 300 MG capsule Take 1 capsule (300 mg total) by mouth 2 (two) times daily.  60 capsule  6  . traZODone (DESYREL) 50 MG tablet Take 2-4 tabs po qhs for insomnia  120 tablet  3  . warfarin (COUMADIN) 5 MG tablet Take as directed by MD  30 tablet  5   No facility-administered medications prior to visit.  PE: Blood pressure 133/74, pulse 79, temperature 99 F (37.2 C), temperature source Temporal, resp. rate 18, height 5\' 3"  (1.6 m), weight 178 lb (80.74 kg), SpO2 96.00%. Gen: alert, well appearing, moves awkwardly, slowly due to MS pains. ZOX:WRUE: no injection, icteris, swelling, or exudate.  EOMI, PERRLA. Mouth: lips without lesion/swelling.  Oral mucosa pink and moist. Oropharynx without erythema, exudate, or swelling.  CV: RRR, no m/r/g.   LUNGS: CTA bilat, nonlabored resps, good aeration in all lung fields. Right CVA and flank tenderness is present.  LAB: CC UA today showed trace protein, trace LEU.  No blood.  Nitrite neg.  IMPRESSION AND PLAN:  UTI, upper and lower suspected. Stone less likely given lack of blood on UA, but if pain not improving with antibiotics in 48h or so then will pursue imaging. Ciprofloxacin 500mg  bid x 10d. Per pt, she has an INR recheck in 1  wk.  Pt states she has specialists for all of her other current problems and her plan is to continue seeing them as she has in the past--regularly.  FOLLOW UP: 2-3 d if not improving

## 2013-12-20 NOTE — Progress Notes (Signed)
Pre visit review using our clinic review tool, if applicable. No additional management support is needed unless otherwise documented below in the visit note. 

## 2013-12-21 ENCOUNTER — Ambulatory Visit: Payer: Medicare Other | Admitting: Family Medicine

## 2013-12-22 LAB — URINE CULTURE

## 2013-12-28 ENCOUNTER — Telehealth: Payer: Self-pay | Admitting: Family Medicine

## 2013-12-28 MED ORDER — TRAZODONE HCL 50 MG PO TABS: ORAL_TABLET | ORAL | Status: DC

## 2013-12-28 NOTE — Telephone Encounter (Signed)
rx for trazodone sent to pharmacy.

## 2013-12-28 NOTE — Telephone Encounter (Signed)
Patient requesting refill on trazadone 50mg . Patient last seen 12/20/13.  Medication last filled 09/08/13 x 3 refills.

## 2013-12-29 ENCOUNTER — Other Ambulatory Visit: Payer: Medicare Other | Admitting: Lab

## 2013-12-29 DIAGNOSIS — M5137 Other intervertebral disc degeneration, lumbosacral region: Secondary | ICD-10-CM | POA: Diagnosis not present

## 2013-12-29 DIAGNOSIS — M25569 Pain in unspecified knee: Secondary | ICD-10-CM | POA: Diagnosis not present

## 2013-12-29 DIAGNOSIS — M1710 Unilateral primary osteoarthritis, unspecified knee: Secondary | ICD-10-CM | POA: Diagnosis not present

## 2013-12-29 DIAGNOSIS — IMO0001 Reserved for inherently not codable concepts without codable children: Secondary | ICD-10-CM | POA: Diagnosis not present

## 2013-12-29 DIAGNOSIS — IMO0002 Reserved for concepts with insufficient information to code with codable children: Secondary | ICD-10-CM | POA: Diagnosis not present

## 2013-12-29 DIAGNOSIS — M159 Polyosteoarthritis, unspecified: Secondary | ICD-10-CM | POA: Diagnosis not present

## 2013-12-30 ENCOUNTER — Other Ambulatory Visit (HOSPITAL_BASED_OUTPATIENT_CLINIC_OR_DEPARTMENT_OTHER): Payer: Medicare Other | Admitting: Lab

## 2013-12-30 DIAGNOSIS — I2699 Other pulmonary embolism without acute cor pulmonale: Secondary | ICD-10-CM

## 2013-12-30 DIAGNOSIS — D509 Iron deficiency anemia, unspecified: Secondary | ICD-10-CM | POA: Diagnosis not present

## 2013-12-30 LAB — PROTIME-INR (CHCC SATELLITE)
INR: 1 — ABNORMAL LOW (ref 2.0–3.5)
PROTIME: 12 s (ref 10.6–13.4)

## 2014-01-05 DIAGNOSIS — M1710 Unilateral primary osteoarthritis, unspecified knee: Secondary | ICD-10-CM | POA: Diagnosis not present

## 2014-01-05 DIAGNOSIS — IMO0002 Reserved for concepts with insufficient information to code with codable children: Secondary | ICD-10-CM | POA: Diagnosis not present

## 2014-01-06 ENCOUNTER — Ambulatory Visit (INDEPENDENT_AMBULATORY_CARE_PROVIDER_SITE_OTHER): Payer: Medicare Other | Admitting: Family Medicine

## 2014-01-06 ENCOUNTER — Encounter: Payer: Self-pay | Admitting: Family Medicine

## 2014-01-06 VITALS — BP 135/89 | HR 95 | Temp 98.4°F | Resp 18 | Ht 63.0 in | Wt 172.0 lb

## 2014-01-06 DIAGNOSIS — J209 Acute bronchitis, unspecified: Secondary | ICD-10-CM

## 2014-01-06 MED ORDER — OSELTAMIVIR PHOSPHATE 75 MG PO CAPS: 75.0000 mg | ORAL_CAPSULE | Freq: Two times a day (BID) | ORAL | Status: DC

## 2014-01-06 MED ORDER — METHYLPREDNISOLONE ACETATE 80 MG/ML IJ SUSP
80.0000 mg | Freq: Once | INTRAMUSCULAR | Status: AC
  Administered 2014-01-06: 80 mg via INTRAMUSCULAR

## 2014-01-06 NOTE — Progress Notes (Signed)
Pre visit review using our clinic review tool, if applicable. No additional management support is needed unless otherwise documented below in the visit note. 

## 2014-01-06 NOTE — Progress Notes (Signed)
OFFICE NOTE  01/06/2014  CC:  Chief Complaint  Patient presents with  . Fever  . Cough     HPI: Patient is a 68 y.o. Caucasian female who is here for cough.   Onset fever, cough, , HA, body achiness <48h ago.  No ST, rash, or n/v/d with this. Will be flying to the [place], leaving in 3d.  Pertinent PMH:  Mild intermittent asthma Fibromyalgia Hx of PE HTN Hx of iron def anemia Knee arthritis Insomnia  Past surgical, social, and family history reviewed and no changes noted since last office visit except to note that her husband's mesothelioma has returned extensively.  MEDS: Not taking cipro as noted below. Outpatient Prescriptions Prior to Visit  Medication Sig Dispense Refill  . albuterol (PROVENTIL HFA;VENTOLIN HFA) 108 (90 BASE) MCG/ACT inhaler 2 puffs.      . ALPRAZolam (XANAX) 1 MG tablet 1/2-1 tab po tid as needed for anxiety or sleep  90 tablet  1  . amitriptyline (ELAVIL) 100 MG tablet Take 1 tablet (100 mg total) by mouth at bedtime. Take 100 mg by mouth nightly.  30 tablet  6  . atorvastatin (LIPITOR) 20 MG tablet Take 1 tablet (20 mg total) by mouth daily at 6 PM.  30 tablet  6  . furosemide (LASIX) 20 MG tablet Take 1 tablet (20 mg total) by mouth daily.  30 tablet  3  . isosorbide mononitrate (IMDUR) 30 MG 24 hr tablet Take 1 tablet (30 mg total) by mouth daily.  30 tablet  3  . meloxicam (MOBIC) 7.5 MG tablet Take 7.5 mg by mouth 2 (two) times daily.       . Milnacipran (SAVELLA) 50 MG TABS tablet Take 1 tablet (50 mg total) by mouth 2 (two) times daily.  60 tablet  3  . nebivolol (BYSTOLIC) 5 MG tablet Take 1 tablet (5 mg total) by mouth daily.  30 tablet  2  . nitroGLYCERIN (NITROSTAT) 0.4 MG SL tablet Place 1 tablet (0.4 mg total) under the tongue every 5 (five) minutes x 3 doses as needed for chest pain.  60 tablet  3  . pregabalin (LYRICA) 300 MG capsule Take 1 capsule (300 mg total) by mouth 2 (two) times daily.  60 capsule  6  . traZODone (DESYREL) 50 MG  tablet Take 2-4 tabs po qhs for insomnia  120 tablet  6  . warfarin (COUMADIN) 5 MG tablet Take as directed by MD  30 tablet  5  . ciprofloxacin (CIPRO) 500 MG tablet Take 1 tablet (500 mg total) by mouth 2 (two) times daily.  20 tablet  0   No facility-administered medications prior to visit.    PE: Blood pressure 135/89, pulse 95, temperature 98.4 F (36.9 C), temperature source Temporal, resp. rate 18, height 5\' 3"  (1.6 m), weight 172 lb (78.019 kg), SpO2 96.00%. VS: noted--normal. Gen: alert, NAD, NONTOXIC APPEARING. HEENT: eyes without injection, drainage, or swelling.  Ears: EACs clear, TMs with normal light reflex and landmarks.  Nose: Clear rhinorrhea, with some dried, crusty exudate adherent to mildly injected mucosa.  No purulent d/c.  No paranasal sinus TTP.  No facial swelling.  Throat and mouth without focal lesion.  No pharyngial swelling, erythema, or exudate.   Neck: supple, no LAD.   LUNGS: CTA bilat, nonlabored resps.   CV: RRR, no m/r/g. EXT: no c/c/e SKIN: no rash  LAB: none  IMPRESSION AND PLAN:  Influenza like illness. She has hx of RAD--used her inhaler once  last night. Discussed treatment options and decided on depo-medrol 80mg  IM today in office. Also start tamiflu 75mg  bid x 5d. Mucinex DM. Fluids.  Rest.  An After Visit Summary was printed and given to the patient.  FOLLOW UP: prn

## 2014-01-10 ENCOUNTER — Other Ambulatory Visit: Payer: Self-pay | Admitting: Family Medicine

## 2014-01-10 MED ORDER — MILNACIPRAN HCL 50 MG PO TABS: 50.0000 mg | ORAL_TABLET | Freq: Two times a day (BID) | ORAL | Status: DC

## 2014-01-12 ENCOUNTER — Other Ambulatory Visit: Payer: Medicare Other | Admitting: Lab

## 2014-01-17 ENCOUNTER — Other Ambulatory Visit: Payer: Self-pay | Admitting: *Deleted

## 2014-01-17 MED ORDER — NEBIVOLOL HCL 5 MG PO TABS: 5.0000 mg | ORAL_TABLET | Freq: Every day | ORAL | Status: DC

## 2014-01-20 ENCOUNTER — Other Ambulatory Visit: Payer: Self-pay | Admitting: Family Medicine

## 2014-01-20 NOTE — Telephone Encounter (Signed)
Refill request from [facility] for Lyrica but per our records she should still have at least two refills.  I called pharmacy and they researched the Rx and she still has refills.

## 2014-01-24 ENCOUNTER — Telehealth: Payer: Self-pay | Admitting: Hematology & Oncology

## 2014-01-24 NOTE — Telephone Encounter (Signed)
Pt called wanting to schedule labs, I left message with husband for 2-4 appointment

## 2014-01-25 ENCOUNTER — Telehealth: Payer: Self-pay | Admitting: Family Medicine

## 2014-01-25 MED ORDER — ALPRAZOLAM 1 MG PO TABS: ORAL_TABLET | ORAL | Status: DC

## 2014-01-25 NOTE — Telephone Encounter (Signed)
Xanax rx printed. 

## 2014-01-25 NOTE — Telephone Encounter (Signed)
Patient requesting refill of xanax.  Patient last seen 01/06/14.   Last rx printed on 11/25/14 x 1 refill.  Please advise.

## 2014-01-26 ENCOUNTER — Other Ambulatory Visit (HOSPITAL_BASED_OUTPATIENT_CLINIC_OR_DEPARTMENT_OTHER): Payer: Medicare Other | Admitting: Lab

## 2014-01-26 DIAGNOSIS — D509 Iron deficiency anemia, unspecified: Secondary | ICD-10-CM

## 2014-01-26 DIAGNOSIS — I2699 Other pulmonary embolism without acute cor pulmonale: Secondary | ICD-10-CM | POA: Diagnosis not present

## 2014-01-26 LAB — PROTIME-INR (CHCC SATELLITE)
INR: 1.7 — ABNORMAL LOW (ref 2.0–3.5)
PROTIME: 20.4 s — AB (ref 10.6–13.4)

## 2014-01-26 NOTE — Telephone Encounter (Signed)
Rx faxed

## 2014-01-27 ENCOUNTER — Telehealth: Payer: Self-pay | Admitting: *Deleted

## 2014-01-27 ENCOUNTER — Encounter: Payer: Self-pay | Admitting: Nurse Practitioner

## 2014-01-27 NOTE — Progress Notes (Signed)
Pt was instructed per Dr. Marin Olp to continue on her 2.5mg  of Coumadin and we will see her at her next appointment 2/19.

## 2014-01-27 NOTE — Telephone Encounter (Addendum)
Called patient to let her know that her INR is low but better. Patient currently on 2.5 mg daily .  Dr. Niel Hummer states to stay on Coumadin 2.5 mg. Daily.

## 2014-01-27 NOTE — Telephone Encounter (Signed)
Message copied by Rico Ala on Thu Jan 27, 2014  2:59 PM ------      Message from: Burney Gauze R      Created: Thu Jan 27, 2014  6:26 AM       Call - INR is better but still low.  How much coumadin is she taking?? Linda [name] ------

## 2014-02-10 ENCOUNTER — Other Ambulatory Visit: Payer: Medicare Other | Admitting: Lab

## 2014-02-10 ENCOUNTER — Telehealth: Payer: Self-pay | Admitting: Hematology & Oncology

## 2014-02-10 ENCOUNTER — Ambulatory Visit: Payer: Medicare Other | Admitting: Hematology & Oncology

## 2014-02-10 NOTE — Telephone Encounter (Signed)
Pt moved 2-19 to 3-13. Manuela Schwartz LPN aware

## 2014-02-11 ENCOUNTER — Encounter: Payer: Self-pay | Admitting: Family Medicine

## 2014-02-11 ENCOUNTER — Ambulatory Visit: Payer: Medicare Other | Admitting: Family Medicine

## 2014-02-11 ENCOUNTER — Ambulatory Visit (HOSPITAL_BASED_OUTPATIENT_CLINIC_OR_DEPARTMENT_OTHER)
Admission: RE | Admit: 2014-02-11 | Discharge: 2014-02-11 | Disposition: A | Discharge status: Home / Self Care | Payer: Medicare Other | Source: Ambulatory Visit | Attending: Family Medicine | Admitting: Family Medicine

## 2014-02-11 VITALS — BP 119/72 | HR 86 | Temp 98.3°F | Resp 22 | Ht 63.0 in | Wt 168.0 lb

## 2014-02-11 DIAGNOSIS — R0989 Other specified symptoms and signs involving the circulatory and respiratory systems: Secondary | ICD-10-CM

## 2014-02-11 DIAGNOSIS — R062 Wheezing: Secondary | ICD-10-CM

## 2014-02-11 DIAGNOSIS — R059 Cough, unspecified: Secondary | ICD-10-CM

## 2014-02-11 DIAGNOSIS — R05 Cough: Secondary | ICD-10-CM

## 2014-02-11 DIAGNOSIS — J449 Chronic obstructive pulmonary disease, unspecified: Secondary | ICD-10-CM | POA: Insufficient documentation

## 2014-02-11 DIAGNOSIS — I509 Heart failure, unspecified: Secondary | ICD-10-CM | POA: Diagnosis not present

## 2014-02-11 DIAGNOSIS — I10 Essential (primary) hypertension: Secondary | ICD-10-CM | POA: Insufficient documentation

## 2014-02-11 DIAGNOSIS — R0602 Shortness of breath: Secondary | ICD-10-CM

## 2014-02-11 DIAGNOSIS — J4489 Other specified chronic obstructive pulmonary disease: Secondary | ICD-10-CM | POA: Insufficient documentation

## 2014-02-11 MED ORDER — LEVOFLOXACIN 500 MG PO TABS: ORAL_TABLET | ORAL | Status: DC

## 2014-02-11 MED ORDER — IPRATROPIUM BROMIDE 0.02 % IN SOLN
0.5000 mg | Freq: Once | RESPIRATORY_TRACT | Status: AC
  Administered 2014-02-11: 0.5 mg via RESPIRATORY_TRACT

## 2014-02-11 MED ORDER — METHYLPREDNISOLONE ACETATE 80 MG/ML IJ SUSP
80.0000 mg | Freq: Once | INTRAMUSCULAR | Status: AC
  Administered 2014-02-11: 80 mg via INTRAMUSCULAR

## 2014-02-11 MED ORDER — PREDNISONE 20 MG PO TABS: ORAL_TABLET | ORAL | Status: DC

## 2014-02-11 MED ORDER — ALBUTEROL SULFATE (2.5 MG/3ML) 0.083% IN NEBU
2.5000 mg | INHALATION_SOLUTION | Freq: Once | RESPIRATORY_TRACT | Status: AC
  Administered 2014-02-11: 2.5 mg via RESPIRATORY_TRACT

## 2014-02-11 MED ORDER — ALBUTEROL SULFATE (2.5 MG/3ML) 0.083% IN NEBU: 2.5000 mg | INHALATION_SOLUTION | Freq: Four times a day (QID) | RESPIRATORY_TRACT | Status: DC | PRN

## 2014-02-11 NOTE — Progress Notes (Signed)
OFFICE NOTE  02/11/2014  CC:  Chief Complaint  Patient presents with  . Cough    x 3 days  . Wheezing     HPI: Patient is a 68 y.o. Caucasian female who is here for cough.   Onset 3 d/a with cough, wheezing/SOB, temp 100 or less.  Had some blood tinged/brown sputum this morning. Last alb was 2 hours ago (HFA).  Using alb regularly last few days.   She reports that she had complete recovery after her ILI about a month ago.  No chest pain.  No palpitations.  Pertinent PMH:  Past medical, surgical, social, and family history reviewed and no changes are noted since last office visit.  MEDS: Not taking tamiflu listed below.  Current coumadin dosing per pt report is 1/2 of a 5mg  tab daily (INR monitoring has been done lately through her oncologist's office).  Outpatient Prescriptions Prior to Visit  Medication Sig Dispense Refill  . albuterol (PROVENTIL HFA;VENTOLIN HFA) 108 (90 BASE) MCG/ACT inhaler 2 puffs.      . ALPRAZolam (XANAX) 1 MG tablet 1/2-1 tab po tid as needed for anxiety or sleep  90 tablet  5  . amitriptyline (ELAVIL) 100 MG tablet Take 1 tablet (100 mg total) by mouth at bedtime. Take 100 mg by mouth nightly.  30 tablet  6  . atorvastatin (LIPITOR) 20 MG tablet Take 1 tablet (20 mg total) by mouth daily at 6 PM.  30 tablet  6  . furosemide (LASIX) 20 MG tablet Take 1 tablet (20 mg total) by mouth daily.  30 tablet  3  . isosorbide mononitrate (IMDUR) 30 MG 24 hr tablet Take 1 tablet (30 mg total) by mouth daily.  30 tablet  3  . meloxicam (MOBIC) 7.5 MG tablet Take 7.5 mg by mouth 2 (two) times daily.       . Milnacipran (SAVELLA) 50 MG TABS tablet Take 1 tablet (50 mg total) by mouth 2 (two) times daily.  60 tablet  3  . nebivolol (BYSTOLIC) 5 MG tablet Take 1 tablet (5 mg total) by mouth daily.  30 tablet  2  . nitroGLYCERIN (NITROSTAT) 0.4 MG SL tablet Place 1 tablet (0.4 mg total) under the tongue every 5 (five) minutes x 3 doses as needed for chest pain.  60 tablet  3   . pregabalin (LYRICA) 300 MG capsule Take 1 capsule (300 mg total) by mouth 2 (two) times daily.  60 capsule  6  . traZODone (DESYREL) 50 MG tablet Take 2-4 tabs po qhs for insomnia  120 tablet  6  . warfarin (COUMADIN) 5 MG tablet Take as directed by MD  30 tablet  5  . oseltamivir (TAMIFLU) 75 MG capsule Take 1 capsule (75 mg total) by mouth 2 (two) times daily.  10 capsule  0   No facility-administered medications prior to visit.    PE: Blood pressure 119/72, pulse 86, temperature 98.3 F (36.8 C), temperature source Temporal, resp. rate 22, height 5\' 3"  (1.6 m), weight 168 lb (76.204 kg), SpO2 95.00%. Gen: Alert, well appearing but mildly labored breathing and frequent coughing.  Patient is oriented to person, place, time, and situation. ENT: Ears: EACs clear, normal epithelium.  TMs with good light reflex and landmarks bilaterally.  Eyes: no injection, icteris, swelling, or exudate.  EOMI, PERRLA. Nose: no drainage or turbinate edema/swelling.  No injection or focal lesion.  Mouth: lips without lesion/swelling.  Oral mucosa pink and moist.  Dentition intact and without obvious  caries or gingival swelling.  Oropharynx without erythema, exudate, or swelling.  Neck - No masses or thyromegaly or limitation in range of motion CV: RRR LUNGS: pre-neb treatment--mildly labored, frequent post-exhalation coughing, faint insp crackles in right base, more audible in left base and they extend further up on left.  No wheezing but aeration mildly diminished on expiration and +prolonged exp phase when she tries to forcefully exhale. EXT: no clubbing, cyanosis, or edema.   IMPRESSION AND PLAN:  Acute asthmatic bronchitis, with possible pneumonia. No signif change in lung exam after alb/atr neb in office today.  Not in resp distress, no hypoxia.  Depo medrol 80mg  IM here today.  Start prednisone tomorrow at 40mg  qd x3d, then 20mg  qd x 3d. Levaquin 500mg  qd x 10d (she has tolerated this fine before).   Continue alb neb q 4h prn.  Continue mucinex DM bid. Check CXR today.  FOLLOW UP: 5d (needs INR check to see if abx affecting coumadin)

## 2014-02-11 NOTE — Progress Notes (Signed)
Pre visit review using our clinic review tool, if applicable. No additional management support is needed unless otherwise documented below in the visit note. 

## 2014-02-16 ENCOUNTER — Ambulatory Visit: Payer: Medicare Other | Admitting: Family Medicine

## 2014-02-16 ENCOUNTER — Encounter: Payer: Self-pay | Admitting: Family Medicine

## 2014-02-16 ENCOUNTER — Ambulatory Visit (HOSPITAL_BASED_OUTPATIENT_CLINIC_OR_DEPARTMENT_OTHER)
Admission: RE | Admit: 2014-02-16 | Discharge: 2014-02-16 | Disposition: A | Discharge status: Home / Self Care | Payer: Medicare Other | Source: Ambulatory Visit | Attending: Family Medicine | Admitting: Family Medicine

## 2014-02-16 VITALS — BP 112/70 | HR 81 | Temp 98.4°F | Resp 18 | Ht 63.0 in | Wt 168.0 lb

## 2014-02-16 DIAGNOSIS — R911 Solitary pulmonary nodule: Secondary | ICD-10-CM | POA: Diagnosis not present

## 2014-02-16 DIAGNOSIS — I2699 Other pulmonary embolism without acute cor pulmonale: Secondary | ICD-10-CM

## 2014-02-16 DIAGNOSIS — R0989 Other specified symptoms and signs involving the circulatory and respiratory systems: Secondary | ICD-10-CM | POA: Diagnosis not present

## 2014-02-16 LAB — PROTIME-INR
INR: 1.5 ratio — AB (ref 0.8–1.0)
Prothrombin Time: 16.1 s — ABNORMAL HIGH (ref 10.2–12.4)

## 2014-02-16 MED ORDER — PREDNISONE 20 MG PO TABS: ORAL_TABLET | ORAL | Status: DC

## 2014-02-16 NOTE — Progress Notes (Signed)
OFFICE NOTE  02/16/2014  CC:  Chief Complaint  Patient presents with  . Follow-up     HPI: Patient is a 68 y.o. Caucasian female who is here for 5d f/u acute asthmatic bronchitis. She is on pred taper + abx. CXR last visit showed possible right lung nodule vs new plaque: we reviewed this x-ray and compared it to her old ones today in office.  Feeling better, requiring albut neb only a couple of times a day lately.  Tm 99-100. Tolerating prednisone --has one more day of 20mg  qd dosing, then stops it. Tolerating levaquin.   Pertinent PMH:  Past medical, surgical, social, and family history reviewed and no changes are noted since last office visit.  MEDS:  Outpatient Prescriptions Prior to Visit  Medication Sig Dispense Refill  . albuterol (PROVENTIL HFA;VENTOLIN HFA) 108 (90 BASE) MCG/ACT inhaler 2 puffs.      [name] Kitchen albuterol (PROVENTIL) (2.5 MG/3ML) 0.083% nebulizer solution Take 3 mLs (2.5 mg total) by nebulization every 6 (six) hours as needed for wheezing or shortness of breath.  75 mL  1  . ALPRAZolam (XANAX) 1 MG tablet 1/2-1 tab po tid as needed for anxiety or sleep  90 tablet  5  . amitriptyline (ELAVIL) 100 MG tablet Take 1 tablet (100 mg total) by mouth at bedtime. Take 100 mg by mouth nightly.  30 tablet  6  . atorvastatin (LIPITOR) 20 MG tablet Take 1 tablet (20 mg total) by mouth daily at 6 PM.  30 tablet  6  . furosemide (LASIX) 20 MG tablet Take 1 tablet (20 mg total) by mouth daily.  30 tablet  3  . isosorbide mononitrate (IMDUR) 30 MG 24 hr tablet Take 1 tablet (30 mg total) by mouth daily.  30 tablet  3  . meloxicam (MOBIC) 7.5 MG tablet Take 7.5 mg by mouth 2 (two) times daily.       . Milnacipran (SAVELLA) 50 MG TABS tablet Take 1 tablet (50 mg total) by mouth 2 (two) times daily.  60 tablet  3  . nebivolol (BYSTOLIC) 5 MG tablet Take 1 tablet (5 mg total) by mouth daily.  30 tablet  2  . nitroGLYCERIN (NITROSTAT) 0.4 MG SL tablet Place 1 tablet (0.4 mg total) under  the tongue every 5 (five) minutes x 3 doses as needed for chest pain.  60 tablet  3  . pregabalin (LYRICA) 300 MG capsule Take 1 capsule (300 mg total) by mouth 2 (two) times daily.  60 capsule  6  . traZODone (DESYREL) 50 MG tablet Take 2-4 tabs po qhs for insomnia  120 tablet  6  . warfarin (COUMADIN) 5 MG tablet Take as directed by MD  30 tablet  5  . levofloxacin (LEVAQUIN) 500 MG tablet 1 tab po qd x 10d  10 tablet  0  . predniSONE (DELTASONE) 20 MG tablet 2 tabs po qd x 3d, then 1 tab po qd x 3d  9 tablet  0   No facility-administered medications prior to visit.    PE: Blood pressure 112/70, pulse 81, temperature 98.4 F (36.9 C), temperature source Temporal, resp. rate 18, height 5\' 3"  (1.6 m), weight 168 lb (76.204 kg), SpO2 94.00%. Gen: Alert, well appearing.  Patient is oriented to person, place, time, and situation. Coughing some, dry. CV: RRR, no m/r/g LUNGS: trace insp crackles in both bases, good aeration on inspiration.  Expiration is w/out wheezing or signif prolongation but she coughs midway through every exhalation.  Nonlabored  resps. EXT: no clubbing, cyanosis, or edema.    IMPRESSION AND PLAN:  Acute (possibly bacterial) asthmatic bronchitis, improving moderately. Will extend her steroids out some: prednisone 20mg  qd x 4d, then 10mg  qd x 4d. Continue q4h prn albut nebs.  Finish levaquin 10d course.   Check INR today since she has been on abx recently (chronically anticoagulated due to PE).  New pulm nodule vs new pleural plaque on CXR 02/11/14. Will further eval with chest CT as per radiologist's recommendations.  An After Visit Summary was printed and given to the patient.  Spent 25 min with pt today, with >50% of this time spent in counseling and care coordination regarding the above problems.  FOLLOW UP: prn

## 2014-02-16 NOTE — Progress Notes (Signed)
Pre visit review using our clinic review tool, if applicable. No additional management support is needed unless otherwise documented below in the visit note. 

## 2014-02-20 ENCOUNTER — Other Ambulatory Visit: Payer: Self-pay | Admitting: Family Medicine

## 2014-03-02 ENCOUNTER — Other Ambulatory Visit: Payer: Self-pay | Admitting: Family Medicine

## 2014-03-04 ENCOUNTER — Ambulatory Visit (HOSPITAL_BASED_OUTPATIENT_CLINIC_OR_DEPARTMENT_OTHER): Payer: Medicare Other

## 2014-03-04 ENCOUNTER — Encounter: Payer: Self-pay | Admitting: Hematology & Oncology

## 2014-03-04 ENCOUNTER — Ambulatory Visit (HOSPITAL_BASED_OUTPATIENT_CLINIC_OR_DEPARTMENT_OTHER): Payer: Medicare Other | Admitting: Hematology & Oncology

## 2014-03-04 ENCOUNTER — Other Ambulatory Visit (HOSPITAL_BASED_OUTPATIENT_CLINIC_OR_DEPARTMENT_OTHER): Payer: Medicare Other | Admitting: Lab

## 2014-03-04 VITALS — BP 131/69 | HR 80 | Temp 97.6°F | Resp 14 | Ht 64.0 in | Wt 171.0 lb

## 2014-03-04 DIAGNOSIS — D510 Vitamin B12 deficiency anemia due to intrinsic factor deficiency: Secondary | ICD-10-CM

## 2014-03-04 DIAGNOSIS — M25421 Effusion, right elbow: Secondary | ICD-10-CM

## 2014-03-04 DIAGNOSIS — G4701 Insomnia due to medical condition: Secondary | ICD-10-CM

## 2014-03-04 DIAGNOSIS — G5620 Lesion of ulnar nerve, unspecified upper limb: Secondary | ICD-10-CM

## 2014-03-04 DIAGNOSIS — I2699 Other pulmonary embolism without acute cor pulmonale: Secondary | ICD-10-CM

## 2014-03-04 DIAGNOSIS — D509 Iron deficiency anemia, unspecified: Secondary | ICD-10-CM | POA: Diagnosis not present

## 2014-03-04 DIAGNOSIS — Z7901 Long term (current) use of anticoagulants: Secondary | ICD-10-CM

## 2014-03-04 LAB — CBC WITH DIFFERENTIAL (CANCER CENTER ONLY)
BASO#: 0 10*3/uL (ref 0.0–0.2)
BASO%: 0.5 % (ref 0.0–2.0)
EOS%: 1.5 % (ref 0.0–7.0)
Eosinophils Absolute: 0.1 10*3/uL (ref 0.0–0.5)
HCT: 35.8 % (ref 34.8–46.6)
HGB: 11.6 g/dL (ref 11.6–15.9)
LYMPH#: 1.9 10*3/uL (ref 0.9–3.3)
LYMPH%: 31.1 % (ref 14.0–48.0)
MCH: 28 pg (ref 26.0–34.0)
MCHC: 32.4 g/dL (ref 32.0–36.0)
MCV: 87 fL (ref 81–101)
MONO#: 0.6 10*3/uL (ref 0.1–0.9)
MONO%: 9.3 % (ref 0.0–13.0)
NEUT#: 3.5 10*3/uL (ref 1.5–6.5)
NEUT%: 57.6 % (ref 39.6–80.0)
PLATELETS: 215 10*3/uL (ref 145–400)
RBC: 4.14 10*6/uL (ref 3.70–5.32)
RDW: 14.8 % (ref 11.1–15.7)
WBC: 6 10*3/uL (ref 3.9–10.0)

## 2014-03-04 LAB — RETICULOCYTES (CHCC)
ABS Retic: 38.1 10*3/uL (ref 19.0–186.0)
RBC.: 4.23 MIL/uL (ref 3.87–5.11)
Retic Ct Pct: 0.9 % (ref 0.4–2.3)

## 2014-03-04 LAB — IRON AND TIBC CHCC
%SAT: 24 % (ref 21–57)
Iron: 73 ug/dL (ref 41–142)
TIBC: 309 ug/dL (ref 236–444)
UIBC: 236 ug/dL (ref 120–384)

## 2014-03-04 LAB — PROTIME-INR (CHCC SATELLITE)
INR: 1.1 — AB (ref 2.0–3.5)
Protime: 13.2 Seconds (ref 10.6–13.4)

## 2014-03-04 LAB — FERRITIN CHCC: Ferritin: 296 ng/ml — ABNORMAL HIGH (ref 9–269)

## 2014-03-04 MED ORDER — DIPHENHYDRAMINE HCL 50 MG/ML IJ SOLN: 25.0000 mg | Freq: Once | INTRAMUSCULAR | Status: DC

## 2014-03-04 MED ORDER — DIPHENHYDRAMINE HCL 25 MG PO CAPS
25.0000 mg | ORAL_CAPSULE | Freq: Once | ORAL | Status: AC
  Administered 2014-03-04: 25 mg via ORAL

## 2014-03-04 MED ORDER — DARBEPOETIN ALFA-POLYSORBATE 300 MCG/0.6ML IJ SOLN: 300.0000 ug | Freq: Once | INTRAMUSCULAR | Status: DC

## 2014-03-04 MED ORDER — METHYLPREDNISOLONE SODIUM SUCC 125 MG IJ SOLR
125.0000 mg | Freq: Once | INTRAMUSCULAR | Status: AC
  Administered 2014-03-04: 125 mg via INTRAVENOUS

## 2014-03-04 MED ORDER — DIPHENHYDRAMINE HCL 25 MG PO CAPS
ORAL_CAPSULE | ORAL | Status: AC
  Filled 2014-03-04: qty 1

## 2014-03-04 MED ORDER — FAMOTIDINE IN NACL 20-0.9 MG/50ML-% IV SOLN
INTRAVENOUS | Status: AC
  Filled 2014-03-04: qty 100

## 2014-03-04 MED ORDER — TEMAZEPAM 7.5 MG PO CAPS: ORAL_CAPSULE | ORAL | Status: DC

## 2014-03-04 MED ORDER — METHYLPREDNISOLONE SODIUM SUCC 125 MG IJ SOLR
INTRAMUSCULAR | Status: AC
  Filled 2014-03-04: qty 2

## 2014-03-04 MED ORDER — SODIUM CHLORIDE 0.9 % IV SOLN
1020.0000 mg | Freq: Once | INTRAVENOUS | Status: AC
  Administered 2014-03-04: 1020 mg via INTRAVENOUS
  Filled 2014-03-04: qty 34

## 2014-03-04 MED ORDER — FAMOTIDINE IN NACL 20-0.9 MG/50ML-% IV SOLN
40.0000 mg | Freq: Two times a day (BID) | INTRAVENOUS | Status: DC
  Administered 2014-03-04: 40 mg via INTRAVENOUS

## 2014-03-04 MED ORDER — CYANOCOBALAMIN 1000 MCG/ML IJ SOLN
1000.0000 ug | Freq: Once | INTRAMUSCULAR | Status: AC
  Administered 2014-03-04: 1000 ug via INTRAMUSCULAR

## 2014-03-04 MED ORDER — SODIUM CHLORIDE 0.9 % IV SOLN
Freq: Once | INTRAVENOUS | Status: AC
  Administered 2014-03-04: 10:00:00 via INTRAVENOUS

## 2014-03-04 MED ORDER — CYANOCOBALAMIN 1000 MCG/ML IJ SOLN
INTRAMUSCULAR | Status: AC
  Filled 2014-03-04: qty 1

## 2014-03-04 NOTE — Progress Notes (Signed)
Hematology and Oncology Follow Up Visit  MERRY POND [id number] Apr 01, 1946 68 y.o. 03/04/2014   Principle Diagnosis:  1. Idiopathic pulmonary embolism. 2. Recurrent iron-deficiency anemia. 3. Chronic congestive heart failure. 4. [name]. 5.  Pernicious anemia  Current Therapy:   . IV iron as indicated. 2. Coumadin-2-year duration. 2. Vit B12 1mg  IM q month     Interim History:  Ms.  Stanek is is back for followup. She is in a lot of stress. Her husband is not doing well. He has mesothelioma. Looks like he is taking chemotherapy but from what she says, he is getting worse.  Her health is now a great. She has terrible arthritis. She just got injections into her knees which have helped.  She feels very weak. She hasn't very little energy.  When she had her labs done back in December, her ferritin was 30 and her iron saturation was 14%. We will give her Feraheme today.  She is on Coumadin. Her INR today was only 1.1. Am not sure as to why this is. We will need to increase her Coumadin to 5 mg a day.  She's not sleeping. Again she really is worrying about her husband.  She also is now complaining of numbness in the right hand in the fourth and fifth fingers go up to her elbow. It sounds like she may have some kind of ulnar nerve compression.  Medications: Current outpatient prescriptions:albuterol (PROVENTIL HFA;VENTOLIN HFA) 108 (90 BASE) MCG/ACT inhaler, 2 puffs., Disp: , Rfl: ;  albuterol (PROVENTIL) (2.5 MG/3ML) 0.083% nebulizer solution, Take 3 mLs (2.5 mg total) by nebulization every 6 (six) hours as needed for wheezing or shortness of breath., Disp: 75 mL, Rfl: 1;  ALPRAZolam (XANAX) 1 MG tablet, 1/2-1 tab po tid as needed for anxiety or sleep, Disp: 90 tablet, Rfl: 5 amitriptyline (ELAVIL) 100 MG tablet, TAKE 1 TABLET (100 MG TOTAL) BY MOUTH AT BEDTIME, Disp: 30 tablet, Rfl: 6;  atorvastatin (LIPITOR) 20 MG tablet, Take 1 tablet (20 mg total) by mouth daily at 6 PM.,  Disp: 30 tablet, Rfl: 6;  furosemide (LASIX) 20 MG tablet, TAKE 1 TABLET BY MOUTH EVERY DAY, Disp: 30 tablet, Rfl: 3;  isosorbide mononitrate (IMDUR) 30 MG 24 hr tablet, Take 1 tablet (30 mg total) by mouth daily., Disp: 30 tablet, Rfl: 3 meloxicam (MOBIC) 7.5 MG tablet, Take 7.5 mg by mouth 2 (two) times daily. , Disp: , Rfl: ;  Milnacipran (SAVELLA) 50 MG TABS tablet, Take 1 tablet (50 mg total) by mouth 2 (two) times daily., Disp: 60 tablet, Rfl: 3;  Multiple Vitamins-Minerals (CENTRUM SILVER) tablet, Take 1 tablet by mouth., Disp: , Rfl: ;  nebivolol (BYSTOLIC) 5 MG tablet, Take 1 tablet (5 mg total) by mouth daily., Disp: 30 tablet, Rfl: 2 nitroGLYCERIN (NITROSTAT) 0.4 MG SL tablet, Place 1 tablet (0.4 mg total) under the tongue every 5 (five) minutes x 3 doses as needed for chest pain., Disp: 60 tablet, Rfl: 3;  pregabalin (LYRICA) 300 MG capsule, Take 1 capsule (300 mg total) by mouth 2 (two) times daily., Disp: 60 capsule, Rfl: 6;  traZODone (DESYREL) 50 MG tablet, Take 2-4 tabs po qhs for insomnia, Disp: 120 tablet, Rfl: 6 warfarin (COUMADIN) 5 MG tablet, Take as directed by MD, Disp: 30 tablet, Rfl: 5  Allergies:  Allergies  Allergen Reactions  . Penicillins Itching, Swelling and Rash    Tolerated Cefepime in ED.    Past Medical History, Surgical history, Social history, and Family History were reviewed  and updated.  Review of Systems: As above  Physical Exam:  height is 5\' 4"  (1.626 m) and weight is 171 lb (77.565 kg). Her oral temperature is 97.6 F (36.4 C). Her blood pressure is 131/69 and her pulse is 80. Her respiration is 14.   Somewhat chronically ill-appearing white female. Her lungs are clear. Cardiac exam regular in rhythm. Abdomen is soft. Has good bowel sounds. There is no palpable liver or spleen. Extremities shows arthritic changes. She has some muscle atrophy in upper and lower extremities. There is some weakness in the fourth and fifth fingers on the right hand. Lymph  node exam is negative. Skin exam shows no rashes. Neurological exam shows the changes in the right hand in the fourth and fifth fingers.  Lab Results  Component Value Date   WBC 6.0 03/04/2014   HGB 11.6 03/04/2014   HCT 35.8 03/04/2014   MCV 87 03/04/2014   PLT 215 03/04/2014     Chemistry      Component Value Date/Time   NA 142 12/14/2013 1208   K 3.4* 12/14/2013 1208   CL 101 12/14/2013 1208   CO2 29 12/14/2013 1208   BUN 18 12/14/2013 1208   CREATININE 1.04 12/14/2013 1208   CREATININE 0.65 05/27/2013 1706      Component Value Date/Time   CALCIUM 9.2 12/14/2013 1208   ALKPHOS 75 12/14/2013 1208   AST 23 12/14/2013 1208   ALT 15 12/14/2013 1208   BILITOT 0.5 12/14/2013 1208         Impression and Plan: Ms. Martes is a 68 year old female. She has a lot of health issues. We will give her iron today. Hopefully this will help. We will also give her some B12. We'll make sure she gets settled mostly for vitamin B12.  Her Coumadin will have to be checked every 2 weeks.  She probably needs an MRI of the right elbow. Again it sounds like she has some compression of the ulnar nerve. If this is negative, then she may need to have an MRI of the cervical spine.  As always, I spent over half hour with Ms. Harbour today. I prayed with her.  We will see her back ourselves in about 6 weeks.   Volanda Napoleon, MD 3/13/201510:14 AM

## 2014-03-04 NOTE — Patient Instructions (Signed)
Vitamin B12 Injections Every person needs vitamin B12. A deficiency develops when the body does not get enough of it. One way to overcome this is by getting B12 shots (injections). A B12 shot puts the vitamin directly into muscle tissue. This avoids any problems your body might have in absorbing it from food or a pill. In some people, the body has trouble using the vitamin correctly. This can cause a B12 deficiency. Not consuming enough of the vitamin can also cause a deficiency. Getting enough vitamin B12 can be hard for elderly people. Sometimes, they do not eat a well-balanced diet. The elderly are also more likely than younger people to have medical conditions or take medications that can lead to a deficiency. WHAT DOES VITAMIN B12 DO? Vitamin B12 does many things to help the body work right:  It helps the body make healthy red blood cells.  It helps maintain nerve cells.  It is involved in the body's process of converting food into energy (metabolism).  It is needed to make the genetic material in all cells (DNA). VITAMIN B12 FOOD SOURCES Most people get plenty of vitamin B12 through the foods they eat. It is present in:  Meat, fish, poultry, and eggs.  Milk and milk products.  It also is added when certain foods are made, including some breads, cereals and yogurts. The food is then called "fortified". CAUSES The most common causes of vitamin B12 deficiency are:  Pernicious anemia. The condition develops when the body cannot make enough healthy red blood cells. This stems from a lack of a protein made in the stomach (intrinsic factor). People without this protein cannot absorb enough vitamin B12 from food.  Malabsorption. This is when the body cannot absorb the vitamin. It can be caused by:  Pernicious anemia.  Surgery to remove part or all of the stomach can lead to malabsorption. Removal of part or all of the small intestine can also cause malabsorption.  Vegetarian diet.  People who are strict about not eating foods from animals could have trouble taking in enough vitamin B12 from diet alone.  Medications. Some medicines have been linked to B12 deficiency, such as Metformin (a drug prescribed for type 2 diabetes). Long-term use of stomach acid suppressants also can keep the vitamin from being absorbed.  Intestinal problems such as inflammatory bowel disease. If there are problems in the digestive tract, vitamin B12 may not be absorbed in good enough amounts. SYMPTOMS People who do not get enough B12 can develop problems. These can include:  Anemia. This is when the body has too few red blood cells. Red blood cells carry oxygen to the rest of the body. Without a healthy supply of red blood cells, people can feel:  Tired (fatigued).  Weak.  Severe anemia can cause:  Shortness of breath.  Dizziness.  Rapid heart rate.  Paleness.  Other Vitamin B12 deficiency symptoms include:  Diarrhea.  Numbness or tingling in the hands or feet.  Loss of appetite.  Confusion.  Sores on the tongue or in the mouth. LET YOUR CAREGIVER KNOW ABOUT:  Any allergies. It is very important to know if you are allergic or sensitive to cobalt. Vitamin B12 contains cobalt.  Any history of kidney disease.  All medications you are taking. Include prescription and over-the-counter medicines, herbs and creams.  Whether you are pregnant or breast-feeding.  If you have Leber's disease, a hereditary eye condition, vitamin B12 could make it worse. RISKS AND COMPLICATIONS Reactions to an injection are   usually temporary. They might include:  Pain at the injection site.  Redness, swelling or tenderness at the site.  Headache, dizziness or weakness.  Nausea, upset stomach or diarrhea.  Numbness or tingling.  Fever.  Joint pain.  Itching or rash. If a reaction does not go away in a short while, talk with your healthcare provider. A change in the way the shots are  given, or where they are given, might need to be made. BEFORE AN INJECTION To decide whether B12 injections are right for you, your healthcare provider will probably:  Ask about your medical history.  Ask questions about your diet.  Ask about symptoms such as:  Have you felt weak?  Do you feel unusually tired?  Do you get dizzy?  Order blood tests. These may include a test to:  Check the level of red cells in your blood.  Measure B12 levels.  Check for the presence of intrinsic factor. VITAMIN B12 INJECTIONS How often you will need a vitamin B12 injection will depend on how severe your deficiency is. This also will affect how long you will need to get them. People with pernicious anemia usually get injections for their entire life. Others might get them for a shorter period. For many people, injections are given daily or weekly for several weeks. Then, once B12 levels are normal, injections are given just once a month. If the cause of the deficiency can be fixed, the injections can be stopped. Talk with your healthcare provider about what you should expect. For an injection:  The injection site will be cleaned with an alcohol swab.  Your healthcare provider will insert a needle directly into a muscle. Most any muscle can be used. Most often, an arm muscle is used. A buttocks muscle can also be used. Many people say shots in that area are less painful.  A small adhesive bandage may be put over the injection site. It usually can be taken off in an hour or less. Injections can be given by your healthcare provider. In some cases, family members give them. Sometimes, people give them to themselves. Talk with your healthcare provider about what would be best for you. If someone other than your healthcare provider will be giving the shots, the person will need to be trained to give them correctly. HOME CARE INSTRUCTIONS   You can remove the adhesive bandage within an hour of getting a  shot.  You should be able to go about your normal activities right away.  Avoid drinking large amounts of alcohol while taking vitamin B12 shots. Alcohol can interfere with the body's use of the vitamin. SEEK MEDICAL CARE IF:   Pain, redness, swelling or tenderness at the injection site does not get better or gets worse.  Headache, dizziness or weakness does not go away.  You develop a fever of more than 100.5 F (38.1 C). SEEK IMMEDIATE MEDICAL CARE IF:   You have chest pain.  You develop shortness of breath.  You have muscle weakness that gets worse.  You develop numbness, weakness or tingling on one side or one area of the body.  You have symptoms of an allergic reaction, such as:  Hives.  Difficulty breathing.  Swelling of the lips, face, tongue or throat.  You develop a fever of more than 102.0 F (38.9 C). MAKE SURE YOU:   Understand these instructions.  Will watch your condition.  Will get help right away if you are not doing well or get worse. Document   Released: 03/07/2009 Document Revised: 03/02/2012 Document Reviewed: 03/07/2009 [facility] Patient Information 2014 [place], [place]. Ferumoxytol injection What is this medicine? FERUMOXYTOL is an iron complex. Iron is used to make healthy red blood cells, which carry oxygen and nutrients throughout the body. This medicine is used to treat iron deficiency anemia in people with chronic kidney disease. This medicine may be used for other purposes; ask your health care provider or pharmacist if you have questions. COMMON BRAND NAME(S): Feraheme  What should I tell my health care provider before I take this medicine? They need to know if you have any of these conditions: -anemia not caused by low iron levels -high levels of iron in the blood -magnetic resonance imaging (MRI) test scheduled -an unusual or allergic reaction to iron, other medicines, foods, dyes, or preservatives -pregnant or trying to get  pregnant -breast-feeding How should I use this medicine? This medicine is for injection into a vein. It is given by a health care professional in a hospital or clinic setting. Talk to your pediatrician regarding the use of this medicine in children. Special care may be needed. Overdosage: If you think you've taken too much of this medicine contact a poison control center or emergency room at once. Overdosage: If you think you have taken too much of this medicine contact a poison control center or emergency room at once. NOTE: This medicine is only for you. Do not share this medicine with others. What if I miss a dose? It is important not to miss your dose. Call your doctor or health care professional if you are unable to keep an appointment. What may interact with this medicine? This medicine may interact with the following medications: -other iron products This list may not describe all possible interactions. Give your health care provider a list of all the medicines, herbs, non-prescription drugs, or dietary supplements you use. Also tell them if you smoke, drink alcohol, or use illegal drugs. Some items may interact with your medicine. What should I watch for while using this medicine? Visit your doctor or healthcare professional regularly. Tell your doctor or healthcare professional if your symptoms do not start to get better or if they get worse. You may need blood work done while you are taking this medicine. You may need to follow a special diet. Talk to your doctor. Foods that contain iron include: whole grains/cereals, dried fruits, beans, or peas, leafy green vegetables, and organ meats (liver, kidney). What side effects may I notice from receiving this medicine? Side effects that you should report to your doctor or health care professional as soon as possible: -allergic reactions like skin rash, itching or hives, swelling of the face, lips, or tongue -breathing problems -changes in  blood pressure -feeling faint or lightheaded, falls -fever or chills -flushing, sweating, or hot feelings -swelling of the ankles or feet Side effects that usually do not require medical attention (Report these to your doctor or health care professional if they continue or are bothersome.): -diarrhea -headache -nausea, vomiting -stomach pain This list may not describe all possible side effects. Call your doctor for medical advice about side effects. You may report side effects to FDA at 1-800-FDA-1088. Where should I keep my medicine? This drug is given in a hospital or clinic and will not be stored at home. NOTE: This sheet is a summary. It may not cover all possible information. If you have questions about this medicine, talk to your doctor, pharmacist, or health care provider.  2014, Elsevier/Gold Standard. (  2012-07-24 15:23:36)  

## 2014-03-07 ENCOUNTER — Encounter: Payer: Self-pay | Admitting: *Deleted

## 2014-03-07 NOTE — Progress Notes (Signed)
Received call from [facility] in Radiology.  States they can not do a MRI with contrast to pt's right elbow unless Dr. Marin Olp is looking for a tumor.  Dr. Marin Olp states he is not looking for a tumor.  [place] notified.

## 2014-03-08 ENCOUNTER — Ambulatory Visit (HOSPITAL_BASED_OUTPATIENT_CLINIC_OR_DEPARTMENT_OTHER)
Admission: RE | Admit: 2014-03-08 | Discharge: 2014-03-08 | Disposition: A | Discharge status: Home / Self Care | Payer: Medicare Other | Source: Ambulatory Visit | Attending: Hematology & Oncology | Admitting: Hematology & Oncology

## 2014-03-08 DIAGNOSIS — M25529 Pain in unspecified elbow: Secondary | ICD-10-CM | POA: Insufficient documentation

## 2014-03-08 DIAGNOSIS — M259 Joint disorder, unspecified: Secondary | ICD-10-CM | POA: Insufficient documentation

## 2014-03-08 DIAGNOSIS — M25421 Effusion, right elbow: Secondary | ICD-10-CM

## 2014-03-08 DIAGNOSIS — M19029 Primary osteoarthritis, unspecified elbow: Secondary | ICD-10-CM | POA: Diagnosis not present

## 2014-03-11 ENCOUNTER — Encounter: Payer: Self-pay | Admitting: *Deleted

## 2014-03-18 ENCOUNTER — Other Ambulatory Visit (HOSPITAL_BASED_OUTPATIENT_CLINIC_OR_DEPARTMENT_OTHER): Payer: Medicare Other | Admitting: Lab

## 2014-03-18 ENCOUNTER — Other Ambulatory Visit: Payer: Self-pay | Admitting: Family Medicine

## 2014-03-18 DIAGNOSIS — D509 Iron deficiency anemia, unspecified: Secondary | ICD-10-CM | POA: Diagnosis not present

## 2014-03-18 DIAGNOSIS — I2699 Other pulmonary embolism without acute cor pulmonale: Secondary | ICD-10-CM | POA: Diagnosis not present

## 2014-03-18 LAB — PROTIME-INR (CHCC SATELLITE)
INR: 1 — AB (ref 2.0–3.5)
Protime: 12 Seconds (ref 10.6–13.4)

## 2014-03-18 NOTE — Telephone Encounter (Signed)
Patient requesting lyrica refill.   Patient last seen 02/16/14.  Last rx was given 08/27/13 x 6 refills.  Please advise refill.

## 2014-03-29 ENCOUNTER — Encounter: Payer: Self-pay | Admitting: *Deleted

## 2014-03-29 ENCOUNTER — Telehealth: Payer: Self-pay | Admitting: Hematology & Oncology

## 2014-03-29 NOTE — Telephone Encounter (Signed)
Pt made 4-9 MD appointment

## 2014-03-29 NOTE — Progress Notes (Signed)
Pt called and asked that the MRI of her right elbow be faxed to Dr. Rhona Raider.  She has appt with him 03/30/14

## 2014-03-31 ENCOUNTER — Encounter: Payer: Self-pay | Admitting: Hematology & Oncology

## 2014-03-31 ENCOUNTER — Ambulatory Visit (HOSPITAL_BASED_OUTPATIENT_CLINIC_OR_DEPARTMENT_OTHER): Payer: Medicare Other | Admitting: Hematology & Oncology

## 2014-03-31 ENCOUNTER — Other Ambulatory Visit: Payer: Medicare Other | Admitting: Lab

## 2014-03-31 ENCOUNTER — Ambulatory Visit (HOSPITAL_BASED_OUTPATIENT_CLINIC_OR_DEPARTMENT_OTHER): Payer: Medicare Other

## 2014-03-31 ENCOUNTER — Other Ambulatory Visit (HOSPITAL_BASED_OUTPATIENT_CLINIC_OR_DEPARTMENT_OTHER): Payer: Medicare Other | Admitting: Lab

## 2014-03-31 VITALS — BP 128/81 | HR 81 | Temp 98.0°F | Resp 14 | Ht 64.0 in | Wt 168.0 lb

## 2014-03-31 DIAGNOSIS — D510 Vitamin B12 deficiency anemia due to intrinsic factor deficiency: Secondary | ICD-10-CM

## 2014-03-31 DIAGNOSIS — Z7901 Long term (current) use of anticoagulants: Secondary | ICD-10-CM | POA: Diagnosis not present

## 2014-03-31 DIAGNOSIS — D509 Iron deficiency anemia, unspecified: Secondary | ICD-10-CM

## 2014-03-31 DIAGNOSIS — I2699 Other pulmonary embolism without acute cor pulmonale: Secondary | ICD-10-CM

## 2014-03-31 LAB — CBC WITH DIFFERENTIAL (CANCER CENTER ONLY)
BASO#: 0 10*3/uL (ref 0.0–0.2)
BASO%: 0.3 % (ref 0.0–2.0)
EOS%: 1.2 % (ref 0.0–7.0)
Eosinophils Absolute: 0.1 10*3/uL (ref 0.0–0.5)
HEMATOCRIT: 35.1 % (ref 34.8–46.6)
HEMOGLOBIN: 11.6 g/dL (ref 11.6–15.9)
LYMPH#: 1.7 10*3/uL (ref 0.9–3.3)
LYMPH%: 17.6 % (ref 14.0–48.0)
MCH: 28.6 pg (ref 26.0–34.0)
MCHC: 33 g/dL (ref 32.0–36.0)
MCV: 87 fL (ref 81–101)
MONO#: 0.7 10*3/uL (ref 0.1–0.9)
MONO%: 7.7 % (ref 0.0–13.0)
NEUT%: 73.2 % (ref 39.6–80.0)
NEUTROS ABS: 6.9 10*3/uL — AB (ref 1.5–6.5)
Platelets: 202 10*3/uL (ref 145–400)
RBC: 4.05 10*6/uL (ref 3.70–5.32)
RDW: 16 % — ABNORMAL HIGH (ref 11.1–15.7)
WBC: 9.4 10*3/uL (ref 3.9–10.0)

## 2014-03-31 LAB — RETICULOCYTES (CHCC)
ABS Retic: 64.8 10*3/uL (ref 19.0–186.0)
RBC.: 4.05 MIL/uL (ref 3.87–5.11)
RETIC CT PCT: 1.6 % (ref 0.4–2.3)

## 2014-03-31 LAB — IRON AND TIBC CHCC
%SAT: 29 % (ref 21–57)
Iron: 80 ug/dL (ref 41–142)
TIBC: 271 ug/dL (ref 236–444)
UIBC: 191 ug/dL (ref 120–384)

## 2014-03-31 LAB — COMPREHENSIVE METABOLIC PANEL
ALT: 14 U/L (ref 0–35)
AST: 19 U/L (ref 0–37)
Albumin: 4.1 g/dL (ref 3.5–5.2)
Alkaline Phosphatase: 73 U/L (ref 39–117)
BILIRUBIN TOTAL: 0.4 mg/dL (ref 0.2–1.2)
BUN: 14 mg/dL (ref 6–23)
CO2: 24 mEq/L (ref 19–32)
CREATININE: 0.87 mg/dL (ref 0.50–1.10)
Calcium: 8.9 mg/dL (ref 8.4–10.5)
Chloride: 105 mEq/L (ref 96–112)
GLUCOSE: 107 mg/dL — AB (ref 70–99)
Potassium: 3.6 mEq/L (ref 3.5–5.3)
Sodium: 141 mEq/L (ref 135–145)
Total Protein: 6.6 g/dL (ref 6.0–8.3)

## 2014-03-31 LAB — PROTIME-INR (CHCC SATELLITE)
INR: 1.6 — ABNORMAL LOW (ref 2.0–3.5)
Protime: 19.2 Seconds — ABNORMAL HIGH (ref 10.6–13.4)

## 2014-03-31 LAB — FERRITIN CHCC: Ferritin: 1081 ng/ml — ABNORMAL HIGH (ref 9–269)

## 2014-03-31 LAB — CHCC SATELLITE - SMEAR

## 2014-03-31 MED ORDER — FAMOTIDINE IN NACL 20-0.9 MG/50ML-% IV SOLN
INTRAVENOUS | Status: AC
  Filled 2014-03-31: qty 100

## 2014-03-31 MED ORDER — FAMOTIDINE IN NACL 20-0.9 MG/50ML-% IV SOLN
40.0000 mg | Freq: Once | INTRAVENOUS | Status: AC
  Administered 2014-03-31: 40 mg via INTRAVENOUS

## 2014-03-31 MED ORDER — METHYLPREDNISOLONE SODIUM SUCC 125 MG IJ SOLR
125.0000 mg | Freq: Once | INTRAMUSCULAR | Status: AC
  Administered 2014-03-31: 125 mg via INTRAVENOUS

## 2014-03-31 MED ORDER — SODIUM CHLORIDE 0.9 % IV SOLN
510.0000 mg | Freq: Once | INTRAVENOUS | Status: AC
  Administered 2014-03-31: 510 mg via INTRAVENOUS
  Filled 2014-03-31: qty 17

## 2014-03-31 MED ORDER — METHYLPREDNISOLONE SODIUM SUCC 125 MG IJ SOLR
INTRAMUSCULAR | Status: AC
  Filled 2014-03-31: qty 2

## 2014-03-31 MED ORDER — CYANOCOBALAMIN 1000 MCG/ML IJ SOLN
INTRAMUSCULAR | Status: AC
  Filled 2014-03-31: qty 1

## 2014-03-31 MED ORDER — FERUMOXYTOL INJECTION 510 MG/17 ML: 510.0000 mg | Freq: Once | INTRAVENOUS | Status: DC

## 2014-03-31 MED ORDER — CYANOCOBALAMIN 1000 MCG/ML IJ SOLN
1000.0000 ug | Freq: Once | INTRAMUSCULAR | Status: AC
  Administered 2014-03-31: 1000 ug via INTRAMUSCULAR

## 2014-03-31 MED ORDER — DIPHENHYDRAMINE HCL 25 MG PO CAPS
25.0000 mg | ORAL_CAPSULE | Freq: Once | ORAL | Status: AC
  Administered 2014-03-31: 25 mg via ORAL

## 2014-03-31 MED ORDER — SODIUM CHLORIDE 0.9 % IV SOLN
Freq: Once | INTRAVENOUS | Status: AC
  Administered 2014-03-31: 12:00:00 via INTRAVENOUS

## 2014-03-31 MED ORDER — DIPHENHYDRAMINE HCL 25 MG PO CAPS
ORAL_CAPSULE | ORAL | Status: AC
Start: 2014-03-31 — End: 2014-03-31
  Filled 2014-03-31: qty 1

## 2014-03-31 NOTE — Progress Notes (Signed)
Hematology and Oncology Follow Up Visit  Linda Linda [name] [id number] April 18, 1946 68 y.o. 03/31/2014   Principle Diagnosis:  Idiopathic pulmonary embolism. 2. Recurrent iron-deficiency anemia. 3. Chronic congestive heart failure. 4. [name]. 5.  Pernicious anemia  Current Therapy:   IV iron as indicated. 2. Coumadin-2-year duration. 2. Vit B12 1mg  IM q month     Interim History:  Ms.  Linda Linda [name] is back for followup. Unfortunately, her husband passed away recently. This has been very difficult for her. Thankfully, her family is trying to help her out.  She still is bothered by arthritis. She probably will need to have surgery at some point.  She's had still some fatigue. Again a lot has to do with her husband's death.  We gave her iron back in March. We also gave her vitamin B12. This is helped her a little bit. She thinks that she probably needs some more.  She's had no bleeding. She's had no nausea vomiting. She's had no cough. She has shortness of breath which is chronic because of the asbestosis and pulmonary embolism. Medications: Current outpatient prescriptions:albuterol (PROVENTIL HFA;VENTOLIN HFA) 108 (90 BASE) MCG/ACT inhaler, 2 puffs., Disp: , Rfl: ;  albuterol (PROVENTIL) (2.5 MG/3ML) 0.083% nebulizer solution, Take 3 mLs (2.5 mg total) by nebulization every 6 (six) hours as needed for wheezing or shortness of breath., Disp: 75 mL, Rfl: 1;  ALPRAZolam (XANAX) 1 MG tablet, 1/2-1 tab po tid as needed for anxiety or sleep, Disp: 90 tablet, Rfl: 5 amitriptyline (ELAVIL) 100 MG tablet, TAKE 1 TABLET (100 MG TOTAL) BY MOUTH AT BEDTIME, Disp: 30 tablet, Rfl: 6;  atorvastatin (LIPITOR) 20 MG tablet, Take 1 tablet (20 mg total) by mouth daily at 6 PM., Disp: 30 tablet, Rfl: 6;  furosemide (LASIX) 20 MG tablet, TAKE 1 TABLET BY MOUTH EVERY DAY, Disp: 30 tablet, Rfl: 3;  isosorbide mononitrate (IMDUR) 30 MG 24 hr tablet, Take 1 tablet (30 mg total) by mouth daily., Disp: 30 tablet, Rfl:  3 LYRICA 300 MG capsule, TAKE ONE CAPSULE BY MOUTH TWICE A DAY, Disp: 60 capsule, Rfl: 5;  meloxicam (MOBIC) 7.5 MG tablet, Take 7.5 mg by mouth 2 (two) times daily. , Disp: , Rfl: ;  Milnacipran (SAVELLA) 50 MG TABS tablet, Take 1 tablet (50 mg total) by mouth 2 (two) times daily., Disp: 60 tablet, Rfl: 3;  Multiple Vitamins-Minerals (CENTRUM SILVER) tablet, Take 1 tablet by mouth., Disp: , Rfl:  nebivolol (BYSTOLIC) 5 MG tablet, Take 1 tablet (5 mg total) by mouth daily., Disp: 30 tablet, Rfl: 2;  nitroGLYCERIN (NITROSTAT) 0.4 MG SL tablet, Place 1 tablet (0.4 mg total) under the tongue every 5 (five) minutes x 3 doses as needed for chest pain., Disp: 60 tablet, Rfl: 3;  temazepam (RESTORIL) 7.5 MG capsule, Take at bedtime , if needed, for sleep, Disp: 30 capsule, Rfl: 0 traZODone (DESYREL) 50 MG tablet, Take 2-4 tabs po qhs for insomnia, Disp: 120 tablet, Rfl: 6;  warfarin (COUMADIN) 5 MG tablet, Take as directed by MD, Disp: 30 tablet, Rfl: 5  Allergies:  Allergies  Allergen Reactions  . Penicillins Itching, Swelling and Rash    Tolerated Cefepime in ED.    Past Medical History, Surgical history, Social history, and Family History were reviewed and updated.  Review of Systems: As above  Physical Exam:  height is 5\' 4"  (1.626 m) and weight is 168 lb (76.204 kg). Her oral temperature is 98 F (36.7 C). Her blood pressure is 128/81 and her pulse is 81.  Her respiration is 14.   Somewhat ill-appearing white female. Lungs are clear bilaterally. Cardiac exam regular rate rhythm with no murmurs rubs or bruits. Abdomen is soft. She good bowel sounds. There is no fluid wave. There is a palpable abdominal mass. There is a probable hepatospleno megaly. Back exam no tenderness over the spine. Extremities shows osteoarthritic changes in her joints. Skin exam shows no rashes. She has no ecchymoses. Neurological exam is nonfocal.  Lab Results  Component Value Date   WBC 9.4 03/31/2014   HGB 11.6 03/31/2014    HCT 35.1 03/31/2014   MCV 87 03/31/2014   PLT 202 03/31/2014     Chemistry      Component Value Date/Time   NA 141 03/31/2014 0930   K 3.6 03/31/2014 0930   CL 105 03/31/2014 0930   CO2 24 03/31/2014 0930   BUN 14 03/31/2014 0930   CREATININE 0.87 03/31/2014 0930   CREATININE 0.65 05/27/2013 1706      Component Value Date/Time   CALCIUM 8.9 03/31/2014 0930   ALKPHOS 73 03/31/2014 0930   AST 19 03/31/2014 0930   ALT 14 03/31/2014 0930   BILITOT 0.4 03/31/2014 0930         Impression and Plan: [name] is 68 year old female. His multiple issues.  We will go ahead and give her iron and B12 today. I will give her a dose of fair hand at 510 mg. Hopefully this will continue to help her with iron.  We will give her B12.  Mr. Linda Linda [name] we need to do with her. Her Coumadin was 1.6 with her INR. This has been difficult to manage. She is on a lot of medications. Is quite troublesome.  I forgot to mention that she had an MRI of her right elbow. She has some torn tendons. She is saying an orthopedic surgeon tomorrow. Acetylation med did have surgery. If so, that we can certainly adjust her Coumadin to get around of the surgery.  I spent over a half hour with her today. Again, she is very emotional over her husband's death. I can understand his. She has a lot of medical problems going on. Am I sure she really like to see her family doctor as she just says he doesn't spend time with her. I will pray about this.  We will continue to monitor her Coumadin. I will see her back in her back in another month. We have see about what she needs to come back for Coumadin checks.   Volanda Napoleon, MD 4/9/20155:35 PM

## 2014-03-31 NOTE — Patient Instructions (Signed)

## 2014-04-01 ENCOUNTER — Telehealth: Payer: Self-pay | Admitting: Hematology & Oncology

## 2014-04-01 DIAGNOSIS — G5600 Carpal tunnel syndrome, unspecified upper limb: Secondary | ICD-10-CM | POA: Diagnosis not present

## 2014-04-01 NOTE — Telephone Encounter (Signed)
Pt aware 4-23,5-7 labs and 5-20 MD appointment. She is also aware 4-24 cx

## 2014-04-06 DIAGNOSIS — M79609 Pain in unspecified limb: Secondary | ICD-10-CM | POA: Diagnosis not present

## 2014-04-10 ENCOUNTER — Other Ambulatory Visit: Payer: Self-pay | Admitting: Hematology & Oncology

## 2014-04-14 ENCOUNTER — Other Ambulatory Visit: Payer: Self-pay | Admitting: Internal Medicine

## 2014-04-14 ENCOUNTER — Other Ambulatory Visit (HOSPITAL_BASED_OUTPATIENT_CLINIC_OR_DEPARTMENT_OTHER): Payer: Medicare Other | Admitting: Lab

## 2014-04-14 DIAGNOSIS — D509 Iron deficiency anemia, unspecified: Secondary | ICD-10-CM | POA: Diagnosis not present

## 2014-04-14 DIAGNOSIS — I2699 Other pulmonary embolism without acute cor pulmonale: Secondary | ICD-10-CM | POA: Diagnosis not present

## 2014-04-14 DIAGNOSIS — D649 Anemia, unspecified: Secondary | ICD-10-CM | POA: Diagnosis not present

## 2014-04-14 LAB — CBC WITH DIFFERENTIAL (CANCER CENTER ONLY)
BASO#: 0 10*3/uL (ref 0.0–0.2)
BASO%: 0.1 % (ref 0.0–2.0)
EOS%: 0.8 % (ref 0.0–7.0)
Eosinophils Absolute: 0.1 10*3/uL (ref 0.0–0.5)
HCT: 35.1 % (ref 34.8–46.6)
HGB: 11.7 g/dL (ref 11.6–15.9)
LYMPH#: 1.7 10*3/uL (ref 0.9–3.3)
LYMPH%: 18.9 % (ref 14.0–48.0)
MCH: 29.4 pg (ref 26.0–34.0)
MCHC: 33.3 g/dL (ref 32.0–36.0)
MCV: 88 fL (ref 81–101)
MONO#: 0.6 10*3/uL (ref 0.1–0.9)
MONO%: 6.8 % (ref 0.0–13.0)
NEUT#: 6.6 10*3/uL — ABNORMAL HIGH (ref 1.5–6.5)
NEUT%: 73.4 % (ref 39.6–80.0)
PLATELETS: 197 10*3/uL (ref 145–400)
RBC: 3.98 10*6/uL (ref 3.70–5.32)
RDW: 15.9 % — ABNORMAL HIGH (ref 11.1–15.7)
WBC: 9 10*3/uL (ref 3.9–10.0)

## 2014-04-14 LAB — COMPREHENSIVE METABOLIC PANEL
ALBUMIN: 4.1 g/dL (ref 3.5–5.2)
ALT: 15 U/L (ref 0–35)
AST: 18 U/L (ref 0–37)
Alkaline Phosphatase: 81 U/L (ref 39–117)
BUN: 14 mg/dL (ref 6–23)
CALCIUM: 8.6 mg/dL (ref 8.4–10.5)
CHLORIDE: 103 meq/L (ref 96–112)
CO2: 26 mEq/L (ref 19–32)
Creatinine, Ser: 0.8 mg/dL (ref 0.50–1.10)
GLUCOSE: 113 mg/dL — AB (ref 70–99)
Potassium: 3.1 mEq/L — ABNORMAL LOW (ref 3.5–5.3)
SODIUM: 140 meq/L (ref 135–145)
TOTAL PROTEIN: 6.8 g/dL (ref 6.0–8.3)
Total Bilirubin: 0.4 mg/dL (ref 0.2–1.2)

## 2014-04-14 LAB — RETICULOCYTES (CHCC)
ABS RETIC: 76.6 10*3/uL (ref 19.0–186.0)
RBC.: 4.03 MIL/uL (ref 3.87–5.11)
RETIC CT PCT: 1.9 % (ref 0.4–2.3)

## 2014-04-14 LAB — PROTIME-INR (CHCC SATELLITE)
INR: 1.3 — AB (ref 2.0–3.5)
PROTIME: 15.6 s — AB (ref 10.6–13.4)

## 2014-04-14 LAB — IRON AND TIBC CHCC
%SAT: 34 % (ref 21–57)
Iron: 82 ug/dL (ref 41–142)
TIBC: 241 ug/dL (ref 236–444)
UIBC: 159 ug/dL (ref 120–384)

## 2014-04-14 LAB — FERRITIN CHCC: Ferritin: 1874 ng/ml — ABNORMAL HIGH (ref 9–269)

## 2014-04-15 ENCOUNTER — Ambulatory Visit: Payer: Medicare Other

## 2014-04-15 ENCOUNTER — Ambulatory Visit: Payer: Medicare Other | Admitting: Hematology & Oncology

## 2014-04-15 ENCOUNTER — Other Ambulatory Visit: Payer: Medicare Other | Admitting: Lab

## 2014-04-18 DIAGNOSIS — M5380 Other specified dorsopathies, site unspecified: Secondary | ICD-10-CM | POA: Diagnosis not present

## 2014-04-18 DIAGNOSIS — Z79899 Other long term (current) drug therapy: Secondary | ICD-10-CM | POA: Diagnosis not present

## 2014-04-18 DIAGNOSIS — M5137 Other intervertebral disc degeneration, lumbosacral region: Secondary | ICD-10-CM | POA: Diagnosis not present

## 2014-04-18 DIAGNOSIS — G894 Chronic pain syndrome: Secondary | ICD-10-CM | POA: Diagnosis not present

## 2014-04-22 ENCOUNTER — Ambulatory Visit (INDEPENDENT_AMBULATORY_CARE_PROVIDER_SITE_OTHER): Payer: Medicare Other | Admitting: Family Medicine

## 2014-04-22 ENCOUNTER — Encounter: Payer: Self-pay | Admitting: Family Medicine

## 2014-04-22 ENCOUNTER — Ambulatory Visit (HOSPITAL_BASED_OUTPATIENT_CLINIC_OR_DEPARTMENT_OTHER)
Admission: RE | Admit: 2014-04-22 | Discharge: 2014-04-22 | Disposition: A | Discharge status: Home / Self Care | Payer: Medicare Other | Source: Ambulatory Visit | Attending: Family Medicine | Admitting: Family Medicine

## 2014-04-22 VITALS — BP 147/87 | HR 84 | Temp 97.7°F | Resp 22 | Ht 63.0 in | Wt 168.0 lb

## 2014-04-22 DIAGNOSIS — R091 Pleurisy: Secondary | ICD-10-CM | POA: Diagnosis present

## 2014-04-22 DIAGNOSIS — I059 Rheumatic mitral valve disease, unspecified: Secondary | ICD-10-CM | POA: Diagnosis not present

## 2014-04-22 DIAGNOSIS — Z77090 Contact with and (suspected) exposure to asbestos: Secondary | ICD-10-CM

## 2014-04-22 DIAGNOSIS — D509 Iron deficiency anemia, unspecified: Secondary | ICD-10-CM | POA: Diagnosis present

## 2014-04-22 DIAGNOSIS — I10 Essential (primary) hypertension: Secondary | ICD-10-CM | POA: Diagnosis present

## 2014-04-22 DIAGNOSIS — Z880 Allergy status to penicillin: Secondary | ICD-10-CM

## 2014-04-22 DIAGNOSIS — G4733 Obstructive sleep apnea (adult) (pediatric): Secondary | ICD-10-CM | POA: Diagnosis not present

## 2014-04-22 DIAGNOSIS — E7800 Pure hypercholesterolemia, unspecified: Secondary | ICD-10-CM | POA: Diagnosis present

## 2014-04-22 DIAGNOSIS — F411 Generalized anxiety disorder: Secondary | ICD-10-CM | POA: Diagnosis present

## 2014-04-22 DIAGNOSIS — R0989 Other specified symptoms and signs involving the circulatory and respiratory systems: Secondary | ICD-10-CM

## 2014-04-22 DIAGNOSIS — F4321 Adjustment disorder with depressed mood: Secondary | ICD-10-CM | POA: Diagnosis present

## 2014-04-22 DIAGNOSIS — R0602 Shortness of breath: Secondary | ICD-10-CM | POA: Diagnosis not present

## 2014-04-22 DIAGNOSIS — M1990 Unspecified osteoarthritis, unspecified site: Secondary | ICD-10-CM | POA: Diagnosis present

## 2014-04-22 DIAGNOSIS — IMO0001 Reserved for inherently not codable concepts without codable children: Secondary | ICD-10-CM | POA: Diagnosis not present

## 2014-04-22 DIAGNOSIS — I5030 Unspecified diastolic (congestive) heart failure: Secondary | ICD-10-CM | POA: Diagnosis not present

## 2014-04-22 DIAGNOSIS — K219 Gastro-esophageal reflux disease without esophagitis: Secondary | ICD-10-CM | POA: Diagnosis present

## 2014-04-22 DIAGNOSIS — J45909 Unspecified asthma, uncomplicated: Secondary | ICD-10-CM

## 2014-04-22 DIAGNOSIS — D649 Anemia, unspecified: Secondary | ICD-10-CM | POA: Diagnosis not present

## 2014-04-22 DIAGNOSIS — I2699 Other pulmonary embolism without acute cor pulmonale: Secondary | ICD-10-CM | POA: Diagnosis not present

## 2014-04-22 DIAGNOSIS — I509 Heart failure, unspecified: Secondary | ICD-10-CM | POA: Diagnosis present

## 2014-04-22 DIAGNOSIS — J069 Acute upper respiratory infection, unspecified: Secondary | ICD-10-CM | POA: Diagnosis present

## 2014-04-22 DIAGNOSIS — G4700 Insomnia, unspecified: Secondary | ICD-10-CM | POA: Diagnosis not present

## 2014-04-22 DIAGNOSIS — R7309 Other abnormal glucose: Secondary | ICD-10-CM | POA: Diagnosis not present

## 2014-04-22 DIAGNOSIS — R0609 Other forms of dyspnea: Secondary | ICD-10-CM | POA: Diagnosis not present

## 2014-04-22 DIAGNOSIS — J9620 Acute and chronic respiratory failure, unspecified whether with hypoxia or hypercapnia: Secondary | ICD-10-CM | POA: Diagnosis present

## 2014-04-22 DIAGNOSIS — N3946 Mixed incontinence: Secondary | ICD-10-CM | POA: Diagnosis present

## 2014-04-22 DIAGNOSIS — R062 Wheezing: Secondary | ICD-10-CM | POA: Diagnosis not present

## 2014-04-22 DIAGNOSIS — Z7901 Long term (current) use of anticoagulants: Secondary | ICD-10-CM | POA: Diagnosis not present

## 2014-04-22 DIAGNOSIS — J441 Chronic obstructive pulmonary disease with (acute) exacerbation: Secondary | ICD-10-CM | POA: Diagnosis present

## 2014-04-22 DIAGNOSIS — G8929 Other chronic pain: Secondary | ICD-10-CM | POA: Diagnosis present

## 2014-04-22 DIAGNOSIS — I5033 Acute on chronic diastolic (congestive) heart failure: Secondary | ICD-10-CM | POA: Diagnosis not present

## 2014-04-22 DIAGNOSIS — Z86711 Personal history of pulmonary embolism: Secondary | ICD-10-CM | POA: Diagnosis not present

## 2014-04-22 DIAGNOSIS — R059 Cough, unspecified: Secondary | ICD-10-CM | POA: Diagnosis not present

## 2014-04-22 DIAGNOSIS — J45901 Unspecified asthma with (acute) exacerbation: Principal | ICD-10-CM

## 2014-04-22 DIAGNOSIS — Z79899 Other long term (current) drug therapy: Secondary | ICD-10-CM | POA: Diagnosis not present

## 2014-04-22 MED ORDER — METHYLPREDNISOLONE ACETATE 80 MG/ML IJ SUSP
80.0000 mg | Freq: Once | INTRAMUSCULAR | Status: AC
  Administered 2014-04-22: 80 mg via INTRAMUSCULAR

## 2014-04-22 MED ORDER — PREDNISONE 20 MG PO TABS: ORAL_TABLET | ORAL | Status: DC

## 2014-04-22 MED ORDER — ALBUTEROL SULFATE (2.5 MG/3ML) 0.083% IN NEBU
2.5000 mg | INHALATION_SOLUTION | Freq: Once | RESPIRATORY_TRACT | Status: AC
  Administered 2014-04-22: 2.5 mg via RESPIRATORY_TRACT

## 2014-04-22 MED ORDER — AZITHROMYCIN 250 MG PO TABS: ORAL_TABLET | ORAL | Status: DC

## 2014-04-22 NOTE — Progress Notes (Signed)
Pre visit review using our clinic review tool, if applicable. No additional management support is needed unless otherwise documented below in the visit note. 

## 2014-04-22 NOTE — Progress Notes (Addendum)
OFFICE NOTE  04/22/2014  CC:  Chief Complaint  Patient presents with  . Cough    one week  . Wheezing  . Shortness of Breath     HPI: Patient is a 68 y.o. Caucasian female who is here for cough. About 1 wk duration, +wheezing, sx's waxing and waning initially but last 28h progressively worsening, nonproductive cough, also with nasal cong/runny nose/St initially but that part is improving. Using alb inhaler several several times a day, most recently this morning.  Has neb machine but hasn't located it at home recently but says she can find it.  Has albut neb sol'n. Some nausea but no vomiting or diarrhea.  Has been taking delsym.  Pertinent PMH:  Past medical, surgical, social, and family history reviewed and no changes are noted since last office visit except that her husband has passed away from his terminal malignant mesothelioma and she is struggling with this, as is understandable.  MEDS:  Not taking furosemide or temazepam listed below. Outpatient Prescriptions Prior to Visit  Medication Sig Dispense Refill  . albuterol (PROVENTIL HFA;VENTOLIN HFA) 108 (90 BASE) MCG/ACT inhaler 2 puffs.      . ALPRAZolam (XANAX) 1 MG tablet 1/2-1 tab po tid as needed for anxiety or sleep  90 tablet  5  . amitriptyline (ELAVIL) 100 MG tablet TAKE 1 TABLET (100 MG TOTAL) BY MOUTH AT BEDTIME  30 tablet  6  . atorvastatin (LIPITOR) 20 MG tablet Take 1 tablet (20 mg total) by mouth daily at 6 PM.  30 tablet  6  . isosorbide mononitrate (IMDUR) 30 MG 24 hr tablet Take 1 tablet (30 mg total) by mouth daily.  30 tablet  3  . LYRICA 300 MG capsule TAKE ONE CAPSULE BY MOUTH TWICE A DAY  60 capsule  5  . meloxicam (MOBIC) 7.5 MG tablet Take 7.5 mg by mouth 2 (two) times daily.       . Milnacipran (SAVELLA) 50 MG TABS tablet Take 1 tablet (50 mg total) by mouth 2 (two) times daily.  60 tablet  3  . Multiple Vitamins-Minerals (CENTRUM SILVER) tablet Take 1 tablet by mouth.      . nebivolol (BYSTOLIC) 5 MG  tablet Take 1 tablet (5 mg total) by mouth daily.  30 tablet  2  . nitroGLYCERIN (NITROSTAT) 0.4 MG SL tablet Place 1 tablet (0.4 mg total) under the tongue every 5 (five) minutes x 3 doses as needed for chest pain.  60 tablet  3  . traZODone (DESYREL) 50 MG tablet Take 2-4 tabs po qhs for insomnia  120 tablet  6  . warfarin (COUMADIN) 5 MG tablet Take as directed by MD  30 tablet  5  . albuterol (PROVENTIL) (2.5 MG/3ML) 0.083% nebulizer solution Take 3 mLs (2.5 mg total) by nebulization every 6 (six) hours as needed for wheezing or shortness of breath.  75 mL  1  . furosemide (LASIX) 20 MG tablet TAKE 1 TABLET BY MOUTH EVERY DAY  30 tablet  3  . temazepam (RESTORIL) 7.5 MG capsule TAKE AT BEDTIME IF NEEDED FOR SLEEP  30 capsule  0   No facility-administered medications prior to visit.    PE: Blood pressure 147/87, pulse 84, temperature 97.7 F (36.5 C), temperature source Temporal, resp. rate 22, height 5\' 3"  (1.6 m), weight 168 lb (76.204 kg), SpO2 95.00%. Gen: tired appearing, mild labored breathing but no distress.  Alert, oriented x 4. JQZ:ESPQ: no injection, icteris, swelling, or exudate.  EOMI, PERRLA.  Mouth: lips without lesion/swelling.  Oral mucosa pink and moist. Oropharynx without erythema, exudate, or swelling.  Neck: no LAD.   LUNGS:   Breathing mildly labored, RR 20, diffuse exp wheezing with prolonged exp phase and diminished BS on insp and expiration diffusely.  Some right>left basilar soft insp crackles present.  After 2 albut nebs her aeration improved, wheezing and prolonged exp phase improved, but she still had bibasilar insp crackles.  EXT: no c/c/e  IMPRESSION AND PLAN:  Viral URI. Acute asthmatic bronchitis.:  Albut 2.5mg  neb in office today x 1, minimal improvement in aeration/mild subjective improvement. Albut 2.5mg  neb repeated after this: moderate improvement in aeration/moderate subjective improvement. I still heard bibasilar insp crackles--could be chronic  scarring sounds from her asbestosis but will check CXR r/o infiltrate or pulm edema.  Depo-medrol 80mg  IM today in office,then continue 40mg  qd x 5d, then 20mg  qd x 5d. Azithromycin x 5d.  Mucinex DM OTC prn.  Albut nebs q4h at home. Check PT/INR at f/u visit in 5d. Check CXR today.  An After Visit Summary was printed and given to the patient.  FOLLOW UP: 5d    ADDENDUM: CXR today showed no acute process.

## 2014-04-23 ENCOUNTER — Inpatient Hospital Stay (HOSPITAL_BASED_OUTPATIENT_CLINIC_OR_DEPARTMENT_OTHER)
Admission: EM | Admit: 2014-04-23 | Discharge: 2014-04-28 | DRG: 190 | Disposition: A | Discharge status: Home / Self Care | Payer: Medicare Other | Attending: Internal Medicine | Admitting: Internal Medicine

## 2014-04-23 ENCOUNTER — Encounter (HOSPITAL_BASED_OUTPATIENT_CLINIC_OR_DEPARTMENT_OTHER): Payer: Self-pay | Admitting: Emergency Medicine

## 2014-04-23 ENCOUNTER — Emergency Department (HOSPITAL_BASED_OUTPATIENT_CLINIC_OR_DEPARTMENT_OTHER): Payer: Medicare Other

## 2014-04-23 DIAGNOSIS — N10 Acute tubulo-interstitial nephritis: Secondary | ICD-10-CM

## 2014-04-23 DIAGNOSIS — J45901 Unspecified asthma with (acute) exacerbation: Secondary | ICD-10-CM

## 2014-04-23 DIAGNOSIS — F4321 Adjustment disorder with depressed mood: Secondary | ICD-10-CM | POA: Diagnosis present

## 2014-04-23 DIAGNOSIS — I10 Essential (primary) hypertension: Secondary | ICD-10-CM

## 2014-04-23 DIAGNOSIS — M797 Fibromyalgia: Secondary | ICD-10-CM

## 2014-04-23 DIAGNOSIS — D509 Iron deficiency anemia, unspecified: Secondary | ICD-10-CM

## 2014-04-23 DIAGNOSIS — M2550 Pain in unspecified joint: Secondary | ICD-10-CM

## 2014-04-23 DIAGNOSIS — R0609 Other forms of dyspnea: Secondary | ICD-10-CM

## 2014-04-23 DIAGNOSIS — J45909 Unspecified asthma, uncomplicated: Secondary | ICD-10-CM | POA: Diagnosis not present

## 2014-04-23 DIAGNOSIS — I5030 Unspecified diastolic (congestive) heart failure: Secondary | ICD-10-CM

## 2014-04-23 DIAGNOSIS — G4700 Insomnia, unspecified: Secondary | ICD-10-CM

## 2014-04-23 DIAGNOSIS — G8929 Other chronic pain: Secondary | ICD-10-CM | POA: Diagnosis present

## 2014-04-23 DIAGNOSIS — Z862 Personal history of diseases of the blood and blood-forming organs and certain disorders involving the immune mechanism: Secondary | ICD-10-CM

## 2014-04-23 DIAGNOSIS — G4733 Obstructive sleep apnea (adult) (pediatric): Secondary | ICD-10-CM

## 2014-04-23 DIAGNOSIS — I2699 Other pulmonary embolism without acute cor pulmonale: Secondary | ICD-10-CM

## 2014-04-23 DIAGNOSIS — D649 Anemia, unspecified: Secondary | ICD-10-CM

## 2014-04-23 DIAGNOSIS — R059 Cough, unspecified: Secondary | ICD-10-CM | POA: Diagnosis not present

## 2014-04-23 DIAGNOSIS — N390 Urinary tract infection, site not specified: Secondary | ICD-10-CM

## 2014-04-23 DIAGNOSIS — G44219 Episodic tension-type headache, not intractable: Secondary | ICD-10-CM

## 2014-04-23 DIAGNOSIS — J920 Pleural plaque with presence of asbestos: Secondary | ICD-10-CM

## 2014-04-23 DIAGNOSIS — R062 Wheezing: Secondary | ICD-10-CM | POA: Diagnosis not present

## 2014-04-23 DIAGNOSIS — I5032 Chronic diastolic (congestive) heart failure: Secondary | ICD-10-CM | POA: Diagnosis present

## 2014-04-23 DIAGNOSIS — R5381 Other malaise: Secondary | ICD-10-CM

## 2014-04-23 DIAGNOSIS — R609 Edema, unspecified: Secondary | ICD-10-CM

## 2014-04-23 DIAGNOSIS — R600 Localized edema: Secondary | ICD-10-CM

## 2014-04-23 DIAGNOSIS — R0602 Shortness of breath: Secondary | ICD-10-CM | POA: Diagnosis not present

## 2014-04-23 DIAGNOSIS — Z79899 Other long term (current) drug therapy: Secondary | ICD-10-CM

## 2014-04-23 DIAGNOSIS — R05 Cough: Secondary | ICD-10-CM | POA: Diagnosis not present

## 2014-04-23 LAB — CBC WITH DIFFERENTIAL/PLATELET
BASOS ABS: 0 10*3/uL (ref 0.0–0.1)
BASOS PCT: 0 % (ref 0–1)
EOS ABS: 0.1 10*3/uL (ref 0.0–0.7)
EOS PCT: 2 % (ref 0–5)
HEMATOCRIT: 31.9 % — AB (ref 36.0–46.0)
HEMOGLOBIN: 10.3 g/dL — AB (ref 12.0–15.0)
Lymphocytes Relative: 24 % (ref 12–46)
Lymphs Abs: 1.7 10*3/uL (ref 0.7–4.0)
MCH: 29.9 pg (ref 26.0–34.0)
MCHC: 32.3 g/dL (ref 30.0–36.0)
MCV: 92.5 fL (ref 78.0–100.0)
MONO ABS: 0.7 10*3/uL (ref 0.1–1.0)
Monocytes Relative: 11 % (ref 3–12)
Neutro Abs: 4.4 10*3/uL (ref 1.7–7.7)
Neutrophils Relative %: 63 % (ref 43–77)
Platelets: 187 10*3/uL (ref 150–400)
RBC: 3.45 MIL/uL — ABNORMAL LOW (ref 3.87–5.11)
RDW: 16.8 % — AB (ref 11.5–15.5)
WBC: 6.9 10*3/uL (ref 4.0–10.5)

## 2014-04-23 LAB — COMPREHENSIVE METABOLIC PANEL
ALBUMIN: 3.4 g/dL — AB (ref 3.5–5.2)
ALT: 31 U/L (ref 0–35)
AST: 72 U/L — ABNORMAL HIGH (ref 0–37)
Alkaline Phosphatase: 66 U/L (ref 39–117)
BILIRUBIN TOTAL: 0.3 mg/dL (ref 0.3–1.2)
BUN: 20 mg/dL (ref 6–23)
CALCIUM: 8.6 mg/dL (ref 8.4–10.5)
CO2: 25 mEq/L (ref 19–32)
Chloride: 105 mEq/L (ref 96–112)
Creatinine, Ser: 1.1 mg/dL (ref 0.50–1.10)
GFR calc Af Amer: 58 mL/min — ABNORMAL LOW (ref >90)
GFR calc non Af Amer: 50 mL/min — ABNORMAL LOW (ref >90)
Glucose, Bld: 96 mg/dL (ref 70–99)
Potassium: 4.2 mEq/L (ref 3.7–5.3)
Sodium: 143 mEq/L (ref 137–147)
TOTAL PROTEIN: 6.7 g/dL (ref 6.0–8.3)

## 2014-04-23 LAB — PROTIME-INR
INR: 1 (ref 0.00–1.49)
PROTHROMBIN TIME: 13 s (ref 11.6–15.2)

## 2014-04-23 LAB — PRO B NATRIURETIC PEPTIDE: Pro B Natriuretic peptide (BNP): 801.7 pg/mL — ABNORMAL HIGH (ref 0–125)

## 2014-04-23 MED ORDER — IPRATROPIUM BROMIDE 0.02 % IN SOLN
RESPIRATORY_TRACT | Status: AC
  Filled 2014-04-23: qty 2.5

## 2014-04-23 MED ORDER — ALBUTEROL SULFATE (2.5 MG/3ML) 0.083% IN NEBU
INHALATION_SOLUTION | RESPIRATORY_TRACT | Status: AC
  Filled 2014-04-23: qty 6

## 2014-04-23 MED ORDER — IPRATROPIUM-ALBUTEROL 0.5-2.5 (3) MG/3ML IN SOLN
RESPIRATORY_TRACT | Status: AC
  Administered 2014-04-23: 3 mL
  Filled 2014-04-23: qty 3

## 2014-04-23 MED ORDER — ALBUTEROL SULFATE (2.5 MG/3ML) 0.083% IN NEBU
INHALATION_SOLUTION | RESPIRATORY_TRACT | Status: AC
  Administered 2014-04-23: 2.5 mg via RESPIRATORY_TRACT
  Filled 2014-04-23: qty 3

## 2014-04-23 MED ORDER — ALBUTEROL (5 MG/ML) CONTINUOUS INHALATION SOLN
10.0000 mg/h | INHALATION_SOLUTION | RESPIRATORY_TRACT | Status: DC
  Administered 2014-04-23: 10 mg/h via RESPIRATORY_TRACT
  Filled 2014-04-23: qty 20

## 2014-04-23 MED ORDER — METHYLPREDNISOLONE SODIUM SUCC 125 MG IJ SOLR
250.0000 mg | Freq: Once | INTRAMUSCULAR | Status: AC
  Administered 2014-04-23: 250 mg via INTRAVENOUS
  Filled 2014-04-23: qty 4

## 2014-04-23 MED ORDER — ALBUTEROL SULFATE (2.5 MG/3ML) 0.083% IN NEBU
5.0000 mg | INHALATION_SOLUTION | Freq: Once | RESPIRATORY_TRACT | Status: AC
  Administered 2014-04-23: 5 mg via RESPIRATORY_TRACT
  Filled 2014-04-23: qty 6

## 2014-04-23 NOTE — ED Notes (Signed)
Assumed care of patient from [place], [place].

## 2014-04-23 NOTE — Progress Notes (Signed)
PENDING ACCEPTANCE TRANFER NOTE:  Call received from:  Dr. Leonard Schwartz at [facility] ED  REASON FOR REQUESTING TRANSFER:    COPD Exacerbation unrelieved by Treatments rendered  HPI:  68 year old female with hx of Asthma/COPD, CHF, [name] Exposure with worsening SOB, Wheezing and Cough for past 5 days. Decreased O2 sats in ED,placed on 2 liters now with O2  sats 94%  Chest X-ray  Negative for Acute findings, No fevers No sputum production.   Given Nebs and placed and dose of Steroids.   She is hemodynamically stable.      PLAN:  According to telephone report, this patient was accepted for transfer to Telemetry Bed,   Under [facility] team: [facility] Team 10,  I have requested an order be written to call Flow Manager at [phone] upon patient arrival to the floor for final physician assignment who will do the admission and give admitting orders.  SIGNED: Theressa Millard, MD [facility]  04/23/2014, 9:27 PM

## 2014-04-23 NOTE — ED Provider Notes (Signed)
CSN: [id number]     Arrival date & time 04/23/14  1723 History  This chart was scribed for Dot Lanes, MD by Elby Beck, ED Scribe. This patient was seen in room MH09/MH09 and the patient's care was started at 6:50 PM.   Chief Complaint  Patient presents with  . Shortness of Breath    The history is provided by the patient. No language interpreter was used.    HPI Comments: Linda [name] is a 68 y.o. female with a history of asthma, asbestosis, COPD, CHF, OSA (on CPAP) and PE who presents to the Emergency Department complaining of constant, moderate SOB with an associated non-productive cough and chest pain over the past 5 days. She states that her SOB is worsened with exertion, such as walking. She states that her chest pain is worsened with coughing. She states that she has been using her at home inhalers with minimal relief. She denies any recent weight gain or leg swelling. She states that she saw her PCP 2 days ago for these symptoms. She states that she has been admitted for asthma in the past, at the time she was first diagnosed. She denies any associated fever. She states that she has never been a smoker.     Past Medical History  Diagnosis Date  . HTN (hypertension)   . Depression   . Recurrent UTI     +hx of hospitalization for pyelonephritis  . Hay fever   . Mixed incontinence urge and stress   . Diverticular disease   . Insomnia   . Fibromyalgia     Patient states dx was around her late 76s but she had sx's for years prior to this.  . Syncope     +Hypotensive; ED visit--Dr. Terrence Dupont did Cath--nonobstructive CAD, EF 55-60%  . Idiopathic angio-edema-urticaria 72014    Angioedema component was very minimal  . 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 Asthma     + asbestososis   . History of pneumonia     hospitalized 12/2011, 02/2013, and 07/2013 [facility] for this  . High  cholesterol     "I'm on Lipitor cause it will help my heart in some way" (07/12/2013)  . Anginal pain     Nonobstructive CAD 2014  . Shortness of breath     "all the time recently" (07/12/2013)  . OSA on CPAP   . Iron deficiency anemia     Hematologist in [place], [place] did extensive w/u; no cause found; failed oral supplement;; gets fairly regular (q61m or so) IV iron infusions (Venofer  200mg  with procrit.  "for 14 yr I've been getting blood work q month & getting infusions prn" (07/12/2013).  Dr. Marin Olp locally, iron infusions done, then erythropoietin deficiency diagnosed: aranesp to be started as of 09/2013.  . H/O hiatal hernia   . Migraine syndrome     "not as often anymore; used to be ~ q wk" (07/12/2013)  . Tension headache, chronic   . DDD (degenerative disc disease), lumbar   . Osteoarthritis     "severe; progressing fast" (07/12/2013)  . DDD (degenerative disc disease), cervical   . Chronic lower back pain   . Anxiety   . Panic attacks   . Nephrolithiasis     "passed all on my own or they are still in there" (07/12/2013)  . Pyelonephritis     "several times  over the last 30 yr" (07/12/2013)  . CHF (congestive heart failure)     Diastolic  . COPD (chronic obstructive pulmonary disease)   . Pulmonary embolism 07/2013    coumadin needed x 6 mo; Dx at [facility] with very small peripheral upper lobe pe 07/2013    . Pleural plaque with presence of asbestos 07/22/2013  . BPPV (benign paroxysmal positional vertigo) 12/16/2012   Past Surgical History  Procedure Laterality Date  . Appendectomy  1960  . Total abdominal hysterectomy  1974  . Tendon release  1996    Right forearm and hand  . Knee surgery  2005  . Heel spur surgery Left 2008  . Plantar fascia release Left 2008  . Axillary surgery Left 1978    Multiple "lump" in armpit per pt  . Coccyx removal  1972  . Cardiac catheterization  01/2013    nonobstructive CAD, EF 55-60%  . Transthoracic echocardiogram  01/2013    NORMAL  .  Dilation and curettage of uterus  ? 1970's  . Eye surgery Left 2012-2013    "injections for ~ 1 yr; don't really know what for" (07/12/2013)   Family History  Problem Relation Age of Onset  . Arthritis Mother   . Kidney disease Mother   . Heart disease Father   . Stroke Father   . Hypertension Father   . Diabetes Father    History  Substance Use Topics  . Smoking status: Never Smoker   . Smokeless tobacco: Never Used     Comment: never used tobacco  . Alcohol Use: No   OB History   Grav Para Term Preterm Abortions TAB SAB Ect Mult Living                 Review of Systems  Constitutional: Negative for fever and unexpected weight change.  Respiratory: Positive for cough and shortness of breath.   Cardiovascular: Positive for chest pain. Negative for leg swelling.  All other systems reviewed and are negative.   Allergies  Penicillins  Home Medications   Prior to Admission medications   Medication Sig Start Date End Date Taking? Authorizing Provider  albuterol (PROVENTIL HFA;VENTOLIN HFA) 108 (90 BASE) MCG/ACT inhaler 2 puffs.    Historical Provider, MD  albuterol (PROVENTIL) (2.5 MG/3ML) 0.083% nebulizer solution Take 3 mLs (2.5 mg total) by nebulization every 6 (six) hours as needed for wheezing or shortness of breath. 02/11/14   Tammi Sou, MD  ALPRAZolam Duanne Moron) 1 MG tablet 1/2-1 tab po tid as needed for anxiety or sleep 01/25/14   Tammi Sou, MD  amitriptyline (ELAVIL) 100 MG tablet TAKE 1 TABLET (100 MG TOTAL) BY MOUTH AT BEDTIME    Tammi Sou, MD  atorvastatin (LIPITOR) 20 MG tablet Take 1 tablet (20 mg total) by mouth daily at 6 PM. 08/30/13   Tammi Sou, MD  azithromycin (ZITHROMAX) 250 MG tablet 2 tabs po qd x1d, then 1 tab po qd x 4d 04/22/14   Tammi Sou, MD  furosemide (LASIX) 20 MG tablet TAKE 1 TABLET BY MOUTH EVERY DAY 03/02/14   Tammi Sou, MD  isosorbide mononitrate (IMDUR) 30 MG 24 hr tablet Take 1 tablet (30 mg total) by mouth  daily. 11/02/13   Tammi Sou, MD  LYRICA 300 MG capsule TAKE ONE CAPSULE BY MOUTH TWICE A DAY 03/18/14   Tammi Sou, MD  meloxicam (MOBIC) 7.5 MG tablet Take 7.5 mg by mouth 2 (two) times daily.  09/20/13   Historical Provider, MD  Milnacipran (SAVELLA) 50 MG TABS tablet Take 1 tablet (50 mg total) by mouth 2 (two) times daily. 01/10/14   Tammi Sou, MD  Multiple Vitamins-Minerals (CENTRUM SILVER) tablet Take 1 tablet by mouth.    Historical Provider, MD  nebivolol (BYSTOLIC) 5 MG tablet Take 1 tablet (5 mg total) by mouth daily. 01/17/14   Tammi Sou, MD  nitroGLYCERIN (NITROSTAT) 0.4 MG SL tablet Place 1 tablet (0.4 mg total) under the tongue every 5 (five) minutes x 3 doses as needed for chest pain. 07/19/13   Reyne Dumas, MD  predniSONE (DELTASONE) 20 MG tablet 2 tabs po qd x 5d, then 1 tab po qd x 5d 04/22/14   Tammi Sou, MD  temazepam (RESTORIL) 7.5 MG capsule TAKE AT BEDTIME IF NEEDED FOR SLEEP 04/10/14   Volanda Napoleon, MD  traZODone (DESYREL) 50 MG tablet Take 2-4 tabs po qhs for insomnia 12/28/13   Tammi Sou, MD  warfarin (COUMADIN) 5 MG tablet Take as directed by MD 09/08/13   Tammi Sou, MD   Triage Vitals: BP 104/59  Temp(Src) 98.4 F (36.9 C) (Oral)  Resp 12  Ht 5\' 4"  (1.626 m)  Wt 165 lb (74.844 kg)  BMI 28.31 kg/m2  SpO2 94%  Physical Exam  Nursing note and vitals reviewed. Constitutional: She is oriented to person, place, and time. She appears well-developed and well-nourished. No distress.  HENT:  Head: Normocephalic and atraumatic.  Eyes: Pupils are equal, round, and reactive to light.  Neck: Normal range of motion.  Cardiovascular: Normal rate and intact distal pulses.   Pulmonary/Chest: No respiratory distress. She has wheezes. She has no rales. She exhibits no tenderness.  Abdominal: Normal appearance. She exhibits no distension.  Musculoskeletal: Normal range of motion.  Neurological: She is alert and oriented to person, place,  and time. No cranial nerve deficit.  Skin: Skin is warm and dry. No rash noted.  Psychiatric: She has a normal mood and affect. Her behavior is normal.    ED Course  Procedures (including critical care time)  DIAGNOSTIC STUDIES: Oxygen Saturation is 94% on [place] (2L.min), adequate by my interpretation.    COORDINATION OF CARE: 6:54 PM- Pt advised of plan for treatment and pt agrees.  Medications  albuterol (PROVENTIL,VENTOLIN) solution continuous neb (0 mg/hr Nebulization Stopped 04/23/14 2200)  albuterol (PROVENTIL) (2.5 MG/3ML) 0.083% nebulizer solution (2.5 mg  Given 04/23/14 1807)  ipratropium-albuterol (DUONEB) 0.5-2.5 (3) MG/3ML nebulizer solution (3 mLs  Given 04/23/14 1807)  albuterol (PROVENTIL) (2.5 MG/3ML) 0.083% nebulizer solution 5 mg (5 mg Nebulization Given 04/23/14 1921)  methylPREDNISolone sodium succinate (SOLU-MEDROL) 125 mg/2 mL injection 250 mg (250 mg Intravenous Given 04/23/14 2205)   Labs Review Labs Reviewed  CBC WITH DIFFERENTIAL - Abnormal; Notable for the following:    RBC 3.45 (*)    Hemoglobin 10.3 (*)    HCT 31.9 (*)    RDW 16.8 (*)    All other components within normal limits  COMPREHENSIVE METABOLIC PANEL - Abnormal; Notable for the following:    Albumin 3.4 (*)    AST 72 (*)    GFR calc non Af Amer 50 (*)    GFR calc Af Amer 58 (*)    All other components within normal limits  PRO B NATRIURETIC PEPTIDE - Abnormal; Notable for the following:    Pro B Natriuretic peptide (BNP) 801.7 (*)    All other components within normal limits  PROTIME-INR  Imaging Review Dg Chest 2 View  04/23/2014   CLINICAL DATA:  Shortness of breath. Cough. History of asthma and hypertension.  EXAM: CHEST  2 VIEW  COMPARISON:  04/22/2014.  FINDINGS: The cardiac silhouette remains borderline enlarged. Bilateral calcified pleural plaques are unchanged. No underlying airspace consolidation. Diffuse osteopenia.  IMPRESSION: No acute abnormality. Stable bilateral calcified pleural  plaques compatible with previous asbestos exposure.   Electronically Signed   By: Enrique Sack M.D.   On: 04/23/2014 18:52   Dg Chest 2 View  04/22/2014   CLINICAL DATA:  Cough, shortness of breath, asthmatic bronchitis, basilar crackles, wheeze  EXAM: CHEST  2 VIEW  COMPARISON:  CT CHEST W/O CM dated 02/16/2014; DG CHEST 2 VIEW dated 02/11/2014; DG CHEST 1V PORT dated 09/13/2013  FINDINGS: Grossly unchanged borderline enlarged cardiac silhouette and mediastinal contours with atherosclerotic plaque within a mildly tortuous thoracic aorta. Extensive bilateral pleural calcifications are grossly unchanged though again degrade / obscure evaluation of the underlying pulmonary parenchyma. No discrete focal airspace opacities. No pleural effusion or pneumothorax. No definite evidence of edema. No acute osseus abnormalities.  IMPRESSION: Sequela of prior asbestos exposure without definite superimposed acute cardiopulmonary disease.   Electronically Signed   By: Sandi Mariscal M.D.   On: 04/22/2014 15:24     EKG Interpretation   Date/Time:  Saturday Apr 23 2014 17:38:43 EDT Ventricular Rate:  79 PR Interval:  146 QRS Duration: 142 QT Interval:  414 QTC Calculation: 474 R Axis:   35 Text Interpretation:  Normal sinus rhythm Right bundle branch block  Abnormal ECG No significant change since last tracing Confirmed by [place]   MD, Braylee Bosher ([id number]) on 04/23/2014 6:37:52 PM      MDM   Final diagnoses:  Asthma  Wheezing       I personally performed the services described in this documentation, which was scribed in my presence. The recorded information has been reviewed and considered.   Dot Lanes, MD 05/04/14 [phone]

## 2014-04-23 NOTE — ED Notes (Signed)
Patient drowsy but arousable. Slow to answer question correctly and falls asleep mid sentence often. Pts husband passed away recently and she states she hasn't been sleeping well. Patient took xanax this morning, as well as her other typical pm meds and the son feels that she is probably taking her night medications in the morning instead. He states that he will monitor the patients medication schedule until he feels comfortable that she is better able to do so herself.

## 2014-04-23 NOTE — Progress Notes (Signed)
Report taken on patient from [place], [place] in [facility] ED.

## 2014-04-23 NOTE — ED Notes (Signed)
Linda [name] [phone]. He has taken jewelry home.  Ring x 2, bracelet, earrings, and watch.

## 2014-04-23 NOTE — ED Notes (Signed)
Onset of shortness of breath, cough for three/four days.  Treated for bronchitis recently.  Denies swelling in her legs (history of CHF).

## 2014-04-24 DIAGNOSIS — R0602 Shortness of breath: Secondary | ICD-10-CM

## 2014-04-24 DIAGNOSIS — T65891A Toxic effect of other specified substances, accidental (unintentional), initial encounter: Secondary | ICD-10-CM

## 2014-04-24 DIAGNOSIS — G4733 Obstructive sleep apnea (adult) (pediatric): Secondary | ICD-10-CM | POA: Insufficient documentation

## 2014-04-24 DIAGNOSIS — D649 Anemia, unspecified: Secondary | ICD-10-CM | POA: Diagnosis not present

## 2014-04-24 DIAGNOSIS — R091 Pleurisy: Secondary | ICD-10-CM

## 2014-04-24 DIAGNOSIS — I5032 Chronic diastolic (congestive) heart failure: Secondary | ICD-10-CM | POA: Diagnosis present

## 2014-04-24 DIAGNOSIS — I10 Essential (primary) hypertension: Secondary | ICD-10-CM | POA: Diagnosis not present

## 2014-04-24 DIAGNOSIS — J45901 Unspecified asthma with (acute) exacerbation: Secondary | ICD-10-CM | POA: Diagnosis not present

## 2014-04-24 LAB — CBC
HCT: 31 % — ABNORMAL LOW (ref 36.0–46.0)
Hemoglobin: 10.1 g/dL — ABNORMAL LOW (ref 12.0–15.0)
MCH: 29.3 pg (ref 26.0–34.0)
MCHC: 32.6 g/dL (ref 30.0–36.0)
MCV: 89.9 fL (ref 78.0–100.0)
Platelets: 166 10*3/uL (ref 150–400)
RBC: 3.45 MIL/uL — ABNORMAL LOW (ref 3.87–5.11)
RDW: 16.7 % — AB (ref 11.5–15.5)
WBC: 6.5 10*3/uL (ref 4.0–10.5)

## 2014-04-24 LAB — PROTIME-INR
INR: 1.03 (ref 0.00–1.49)
PROTHROMBIN TIME: 13.3 s (ref 11.6–15.2)

## 2014-04-24 LAB — BASIC METABOLIC PANEL
BUN: 17 mg/dL (ref 6–23)
CALCIUM: 8.7 mg/dL (ref 8.4–10.5)
CO2: 23 mEq/L (ref 19–32)
CREATININE: 0.64 mg/dL (ref 0.50–1.10)
Chloride: 103 mEq/L (ref 96–112)
GFR calc Af Amer: >90 mL/min (ref >90)
GFR calc non Af Amer: 90 mL/min — ABNORMAL LOW (ref >90)
GLUCOSE: 172 mg/dL — AB (ref 70–99)
Potassium: 4 mEq/L (ref 3.7–5.3)
Sodium: 140 mEq/L (ref 137–147)

## 2014-04-24 MED ORDER — AZITHROMYCIN 250 MG PO TABS
250.0000 mg | ORAL_TABLET | Freq: Every day | ORAL | Status: DC
  Filled 2014-04-24: qty 1

## 2014-04-24 MED ORDER — ENOXAPARIN SODIUM 40 MG/0.4ML ~~LOC~~ SOLN
40.0000 mg | SUBCUTANEOUS | Status: DC
  Administered 2014-04-24: 40 mg via SUBCUTANEOUS
  Filled 2014-04-24 (×2): qty 0.4

## 2014-04-24 MED ORDER — HYDROMORPHONE HCL PF 1 MG/ML IJ SOLN
0.5000 mg | INTRAMUSCULAR | Status: DC | PRN
  Administered 2014-04-25 – 2014-04-27 (×8): 1 mg via INTRAVENOUS
  Filled 2014-04-24 (×8): qty 1

## 2014-04-24 MED ORDER — ZOLPIDEM TARTRATE 5 MG PO TABS
5.0000 mg | ORAL_TABLET | Freq: Once | ORAL | Status: AC
  Administered 2014-04-24: 5 mg via ORAL
  Filled 2014-04-24: qty 1

## 2014-04-24 MED ORDER — SODIUM CHLORIDE 0.9 % IJ SOLN
3.0000 mL | Freq: Two times a day (BID) | INTRAMUSCULAR | Status: DC
  Administered 2014-04-24 – 2014-04-28 (×4): 3 mL via INTRAVENOUS

## 2014-04-24 MED ORDER — MELOXICAM 7.5 MG PO TABS: 7.5000 mg | ORAL_TABLET | Freq: Two times a day (BID) | ORAL | Status: DC

## 2014-04-24 MED ORDER — AMITRIPTYLINE HCL 100 MG PO TABS
100.0000 mg | ORAL_TABLET | Freq: Every day | ORAL | Status: DC
  Administered 2014-04-24 – 2014-04-27 (×4): 100 mg via ORAL
  Filled 2014-04-24 (×5): qty 1

## 2014-04-24 MED ORDER — SODIUM CHLORIDE 0.9 % IV SOLN: 250.0000 mL | INTRAVENOUS | Status: DC | PRN

## 2014-04-24 MED ORDER — PANTOPRAZOLE SODIUM 40 MG PO TBEC
40.0000 mg | DELAYED_RELEASE_TABLET | Freq: Two times a day (BID) | ORAL | Status: DC
  Administered 2014-04-24 – 2014-04-28 (×8): 40 mg via ORAL
  Filled 2014-04-24 (×8): qty 1

## 2014-04-24 MED ORDER — IPRATROPIUM BROMIDE 0.02 % IN SOLN
0.5000 mg | RESPIRATORY_TRACT | Status: DC
  Administered 2014-04-24: 0.5 mg via RESPIRATORY_TRACT
  Filled 2014-04-24: qty 2.5

## 2014-04-24 MED ORDER — NITROGLYCERIN 0.4 MG SL SUBL: 0.4000 mg | SUBLINGUAL_TABLET | SUBLINGUAL | Status: DC | PRN

## 2014-04-24 MED ORDER — NEBIVOLOL HCL 5 MG PO TABS
5.0000 mg | ORAL_TABLET | Freq: Every day | ORAL | Status: DC
  Administered 2014-04-24 – 2014-04-28 (×5): 5 mg via ORAL
  Filled 2014-04-24 (×5): qty 1

## 2014-04-24 MED ORDER — ATORVASTATIN CALCIUM 20 MG PO TABS
20.0000 mg | ORAL_TABLET | Freq: Every day | ORAL | Status: DC
  Administered 2014-04-24 – 2014-04-27 (×4): 20 mg via ORAL
  Filled 2014-04-24 (×5): qty 1

## 2014-04-24 MED ORDER — MILNACIPRAN HCL 50 MG PO TABS
50.0000 mg | ORAL_TABLET | Freq: Two times a day (BID) | ORAL | Status: DC
  Administered 2014-04-24 – 2014-04-28 (×10): 50 mg via ORAL
  Filled 2014-04-24 (×12): qty 1

## 2014-04-24 MED ORDER — WARFARIN SODIUM 5 MG PO TABS: 5.0000 mg | ORAL_TABLET | Freq: Every day | ORAL | Status: DC

## 2014-04-24 MED ORDER — DIPHENHYDRAMINE HCL 25 MG PO CAPS
25.0000 mg | ORAL_CAPSULE | Freq: Once | ORAL | Status: AC
  Administered 2014-04-24: 25 mg via ORAL
  Filled 2014-04-24: qty 1

## 2014-04-24 MED ORDER — MILNACIPRAN HCL 50 MG PO TABS: 50.0000 mg | ORAL_TABLET | Freq: Two times a day (BID) | ORAL | Status: DC

## 2014-04-24 MED ORDER — ONDANSETRON HCL 4 MG/2ML IJ SOLN: 4.0000 mg | Freq: Four times a day (QID) | INTRAMUSCULAR | Status: DC | PRN

## 2014-04-24 MED ORDER — ALBUTEROL SULFATE (2.5 MG/3ML) 0.083% IN NEBU
2.5000 mg | INHALATION_SOLUTION | RESPIRATORY_TRACT | Status: DC | PRN
  Filled 2014-04-24: qty 3

## 2014-04-24 MED ORDER — PREGABALIN 100 MG PO CAPS
300.0000 mg | ORAL_CAPSULE | Freq: Two times a day (BID) | ORAL | Status: DC
  Administered 2014-04-24 – 2014-04-28 (×10): 300 mg via ORAL
  Filled 2014-04-24 (×9): qty 3

## 2014-04-24 MED ORDER — LEVOFLOXACIN 500 MG PO TABS
500.0000 mg | ORAL_TABLET | Freq: Every day | ORAL | Status: DC
  Administered 2014-04-24 – 2014-04-28 (×5): 500 mg via ORAL
  Filled 2014-04-24 (×5): qty 1

## 2014-04-24 MED ORDER — FUROSEMIDE 20 MG PO TABS
20.0000 mg | ORAL_TABLET | Freq: Every day | ORAL | Status: DC
  Administered 2014-04-24 – 2014-04-26 (×3): 20 mg via ORAL
  Filled 2014-04-24 (×3): qty 1

## 2014-04-24 MED ORDER — ALPRAZOLAM 0.5 MG PO TABS: 1.0000 mg | ORAL_TABLET | Freq: Three times a day (TID) | ORAL | Status: DC

## 2014-04-24 MED ORDER — MAGNESIUM SULFATE IN D5W 10-5 MG/ML-% IV SOLN
1.0000 g | Freq: Once | INTRAVENOUS | Status: AC
  Administered 2014-04-24: 1 g via INTRAVENOUS
  Filled 2014-04-24: qty 100

## 2014-04-24 MED ORDER — OXYCODONE HCL 5 MG PO TABS
5.0000 mg | ORAL_TABLET | ORAL | Status: DC | PRN
  Administered 2014-04-24 – 2014-04-26 (×5): 5 mg via ORAL
  Filled 2014-04-24 (×5): qty 1

## 2014-04-24 MED ORDER — DM-GUAIFENESIN ER 30-600 MG PO TB12
1.0000 | ORAL_TABLET | Freq: Two times a day (BID) | ORAL | Status: DC
  Administered 2014-04-24 – 2014-04-28 (×10): 1 via ORAL
  Filled 2014-04-24 (×11): qty 1

## 2014-04-24 MED ORDER — ALPRAZOLAM 0.5 MG PO TABS
1.0000 mg | ORAL_TABLET | Freq: Two times a day (BID) | ORAL | Status: DC | PRN
  Administered 2014-04-24: 1 mg via ORAL
  Filled 2014-04-24: qty 2

## 2014-04-24 MED ORDER — WARFARIN SODIUM 5 MG PO TABS
5.0000 mg | ORAL_TABLET | Freq: Once | ORAL | Status: AC
  Administered 2014-04-24: 5 mg via ORAL
  Filled 2014-04-24: qty 1

## 2014-04-24 MED ORDER — ARFORMOTEROL TARTRATE 15 MCG/2ML IN NEBU
15.0000 ug | INHALATION_SOLUTION | Freq: Two times a day (BID) | RESPIRATORY_TRACT | Status: DC
  Administered 2014-04-24 – 2014-04-28 (×9): 15 ug via RESPIRATORY_TRACT
  Filled 2014-04-24 (×11): qty 2

## 2014-04-24 MED ORDER — AMITRIPTYLINE HCL 100 MG PO TABS
100.0000 mg | ORAL_TABLET | Freq: Every day | ORAL | Status: DC
  Filled 2014-04-24: qty 1

## 2014-04-24 MED ORDER — TRAZODONE HCL 100 MG PO TABS
100.0000 mg | ORAL_TABLET | Freq: Every evening | ORAL | Status: DC | PRN
  Filled 2014-04-24: qty 1

## 2014-04-24 MED ORDER — BUDESONIDE 0.5 MG/2ML IN SUSP
0.2500 mg | Freq: Two times a day (BID) | RESPIRATORY_TRACT | Status: DC
  Administered 2014-04-24 – 2014-04-28 (×9): 0.25 mg via RESPIRATORY_TRACT
  Filled 2014-04-24 (×12): qty 2

## 2014-04-24 MED ORDER — METHYLPREDNISOLONE SODIUM SUCC 125 MG IJ SOLR
125.0000 mg | Freq: Four times a day (QID) | INTRAMUSCULAR | Status: AC
  Administered 2014-04-24 (×4): 125 mg via INTRAVENOUS
  Filled 2014-04-24 (×4): qty 2

## 2014-04-24 MED ORDER — ONDANSETRON HCL 4 MG PO TABS: 4.0000 mg | ORAL_TABLET | Freq: Four times a day (QID) | ORAL | Status: DC | PRN

## 2014-04-24 MED ORDER — ALPRAZOLAM 0.5 MG PO TABS
1.0000 mg | ORAL_TABLET | Freq: Three times a day (TID) | ORAL | Status: DC
  Administered 2014-04-24 – 2014-04-27 (×8): 1 mg via ORAL
  Filled 2014-04-24 (×8): qty 2

## 2014-04-24 MED ORDER — ALUM & MAG HYDROXIDE-SIMETH 200-200-20 MG/5ML PO SUSP: 30.0000 mL | Freq: Four times a day (QID) | ORAL | Status: DC | PRN

## 2014-04-24 MED ORDER — ISOSORBIDE MONONITRATE ER 30 MG PO TB24
30.0000 mg | ORAL_TABLET | Freq: Every day | ORAL | Status: DC
  Administered 2014-04-24 – 2014-04-28 (×5): 30 mg via ORAL
  Filled 2014-04-24 (×5): qty 1

## 2014-04-24 MED ORDER — HYDRALAZINE HCL 20 MG/ML IJ SOLN
10.0000 mg | Freq: Four times a day (QID) | INTRAMUSCULAR | Status: DC | PRN
  Administered 2014-04-25 (×2): 10 mg via INTRAVENOUS
  Filled 2014-04-24 (×2): qty 1

## 2014-04-24 MED ORDER — SODIUM CHLORIDE 0.9 % IJ SOLN: 3.0000 mL | INTRAMUSCULAR | Status: DC | PRN

## 2014-04-24 MED ORDER — ALBUTEROL SULFATE (2.5 MG/3ML) 0.083% IN NEBU: 2.5000 mg | INHALATION_SOLUTION | RESPIRATORY_TRACT | Status: DC | PRN

## 2014-04-24 MED ORDER — WARFARIN - PHARMACIST DOSING INPATIENT: Freq: Every day | Status: DC

## 2014-04-24 MED ORDER — ACETAMINOPHEN 325 MG PO TABS
650.0000 mg | ORAL_TABLET | Freq: Four times a day (QID) | ORAL | Status: DC | PRN
  Administered 2014-04-24: 650 mg via ORAL
  Filled 2014-04-24: qty 2

## 2014-04-24 MED ORDER — ADULT MULTIVITAMIN W/MINERALS CH
1.0000 | ORAL_TABLET | Freq: Every morning | ORAL | Status: DC
  Administered 2014-04-24 – 2014-04-28 (×5): 1 via ORAL
  Filled 2014-04-24 (×5): qty 1

## 2014-04-24 MED ORDER — ALBUTEROL SULFATE (2.5 MG/3ML) 0.083% IN NEBU
2.5000 mg | INHALATION_SOLUTION | Freq: Four times a day (QID) | RESPIRATORY_TRACT | Status: DC
  Administered 2014-04-24: 2.5 mg via RESPIRATORY_TRACT
  Filled 2014-04-24: qty 3

## 2014-04-24 MED ORDER — BUDESONIDE-FORMOTEROL FUMARATE 160-4.5 MCG/ACT IN AERO
2.0000 | INHALATION_SPRAY | Freq: Two times a day (BID) | RESPIRATORY_TRACT | Status: DC
  Administered 2014-04-24: 2 via RESPIRATORY_TRACT
  Filled 2014-04-24: qty 6

## 2014-04-24 MED ORDER — PREGABALIN 100 MG PO CAPS
300.0000 mg | ORAL_CAPSULE | Freq: Two times a day (BID) | ORAL | Status: DC
  Filled 2014-04-24: qty 3

## 2014-04-24 MED ORDER — FUROSEMIDE 20 MG PO TABS
20.0000 mg | ORAL_TABLET | Freq: Every day | ORAL | Status: DC | PRN
  Filled 2014-04-24: qty 1

## 2014-04-24 MED ORDER — SODIUM CHLORIDE 0.9 % IJ SOLN
3.0000 mL | Freq: Two times a day (BID) | INTRAMUSCULAR | Status: DC
Start: 2014-04-24 — End: 2014-04-28
  Administered 2014-04-24 – 2014-04-28 (×9): 3 mL via INTRAVENOUS

## 2014-04-24 MED ORDER — ACETAMINOPHEN 650 MG RE SUPP: 650.0000 mg | Freq: Four times a day (QID) | RECTAL | Status: DC | PRN

## 2014-04-24 MED ORDER — ALBUTEROL SULFATE (2.5 MG/3ML) 0.083% IN NEBU
2.5000 mg | INHALATION_SOLUTION | RESPIRATORY_TRACT | Status: DC
  Administered 2014-04-23: 2.5 mg via RESPIRATORY_TRACT
  Filled 2014-04-24: qty 3

## 2014-04-24 NOTE — Progress Notes (Addendum)
ANTICOAGULATION CONSULT NOTE - Initial Consult  Pharmacy Consult for Warfarin  Indication: pulmonary embolus, h/o   Allergies  Allergen Reactions  . Penicillins Itching, Swelling and Rash    Tolerated Cefepime in ED.   Patient Measurements: Height: 5\' 4"  (162.6 cm) Weight: 165 lb (74.844 kg) IBW/kg (Calculated) : 54.7  Labs:  Recent Labs  04/23/14 1800  HGB 10.3*  HCT 31.9*  PLT 187  LABPROT 13.0  INR 1.00  CREATININE 1.10   Medications:  Warfarin 2.5mg /day (confirmed with patient)  Assessment: 68 y/o F on warfarin for h/o PE, INR is 1 on admit (?compliance). Hgb 10.3, renal function good, other labs as above. Noted DDI with Azithromycin.   Goal of Therapy:  INR 2-3 Monitor platelets by anticoagulation protocol: Yes   Plan:  -Warfarin 5mg  PO x 1 now -Daily PT/INR, adjust dose as needed -Monitor for bleeding -Lovenox until INR is therapeutic   Narda Bonds 04/24/2014,2:36 AM

## 2014-04-24 NOTE — Progress Notes (Signed)
ANTICOAGULATION CONSULT NOTE - Follow Up Consult  Pharmacy Consult for warfarin Indication: history of PE  Allergies  Allergen Reactions  . Penicillins Itching, Swelling and Rash    Tolerated Cefepime in ED.    Patient Measurements: Height: 5\' 4"  (162.6 cm) Weight: 165 lb (74.844 kg) IBW/kg (Calculated) : 54.7   Vital Signs: Temp: 98.3 F (36.8 C) (05/03 0937) Temp src: Oral (05/03 0937) BP: 155/75 mmHg (05/03 0937) Pulse Rate: 105 (05/03 0937)  Labs:  Recent Labs  04/23/14 1800 04/24/14 0711 04/24/14 0856  HGB 10.3* 10.1*  --   HCT 31.9* 31.0*  --   PLT 187 166  --   LABPROT 13.0  --  13.3  INR 1.00  --  1.03  CREATININE 1.10 0.64  --     Estimated Creatinine Clearance: 66.6 ml/min (by C-G formula based on Cr of 0.64).   Medications:  Prescriptions prior to admission  Medication Sig Dispense Refill  . albuterol (PROVENTIL HFA;VENTOLIN HFA) 108 (90 BASE) MCG/ACT inhaler Inhale 2 puffs into the lungs every 6 (six) hours as needed for wheezing or shortness of breath.       [name] Kitchen albuterol (PROVENTIL) (2.5 MG/3ML) 0.083% nebulizer solution Take 3 mLs (2.5 mg total) by nebulization every 6 (six) hours as needed for wheezing or shortness of breath.  75 mL  1  . ALPRAZolam (XANAX) 1 MG tablet 1/2-1 tab po tid as needed for anxiety or sleep  90 tablet  5  . amitriptyline (ELAVIL) 100 MG tablet Take 100 mg by mouth at bedtime.      [name] Kitchen atorvastatin (LIPITOR) 20 MG tablet Take 1 tablet (20 mg total) by mouth daily at 6 PM.  30 tablet  6  . azithromycin (ZITHROMAX) 250 MG tablet 2 tabs po qd x1d, then 1 tab po qd x 4d  6 each  0  . furosemide (LASIX) 20 MG tablet Take 20 mg by mouth daily as needed for fluid.      . isosorbide mononitrate (IMDUR) 30 MG 24 hr tablet Take 1 tablet (30 mg total) by mouth daily.  30 tablet  3  . meloxicam (MOBIC) 7.5 MG tablet Take 7.5 mg by mouth 2 (two) times daily.       . Milnacipran (SAVELLA) 50 MG TABS tablet Take 1 tablet (50 mg total) by  mouth 2 (two) times daily.  60 tablet  3  . Multiple Vitamins-Minerals (CENTRUM SILVER) tablet Take 1 tablet by mouth.      . nebivolol (BYSTOLIC) 5 MG tablet Take 1 tablet (5 mg total) by mouth daily.  30 tablet  2  . nitroGLYCERIN (NITROSTAT) 0.4 MG SL tablet Place 1 tablet (0.4 mg total) under the tongue every 5 (five) minutes x 3 doses as needed for chest pain.  60 tablet  3  . predniSONE (DELTASONE) 20 MG tablet 2 tabs po qd x 5d, then 1 tab po qd x 5d  15 tablet  0  . pregabalin (LYRICA) 300 MG capsule Take 300 mg by mouth 2 (two) times daily.      . traZODone (DESYREL) 50 MG tablet Take 2-4 tabs po qhs for insomnia  120 tablet  6  . warfarin (COUMADIN) 5 MG tablet Take 2.5 mg by mouth daily.       Scheduled:  . arformoterol  15 mcg Nebulization BID  . atorvastatin  20 mg Oral q1800  . budesonide  0.25 mg Nebulization BID  . dextromethorphan-guaiFENesin  1 tablet Oral BID  .  enoxaparin (LOVENOX) injection  40 mg Subcutaneous Q24H  . furosemide  20 mg Oral Daily  . isosorbide mononitrate  30 mg Oral Daily  . levofloxacin  500 mg Oral Daily  . methylPREDNISolone (SOLU-MEDROL) injection  125 mg Intravenous Q6H  . Milnacipran  50 mg Oral BID  . multivitamin with minerals  1 tablet Oral q morning - 10a  . nebivolol  5 mg Oral Daily  . pantoprazole  40 mg Oral BID AC  . pregabalin  300 mg Oral BID  . sodium chloride  3 mL Intravenous Q12H  . sodium chloride  3 mL Intravenous Q12H  . Warfarin - Pharmacist Dosing Inpatient   Does not apply q1800    Assessment: 68 yo female here with SOB and noted with asthma exacerbation.  Patient on coumadin for history of PE (08/2013) and was to take for 6 months but has been subtherapeutic so therapy has continued.  Pharmacy has been consulted to dose coumadin. Patient noted on lovenox 40mg  sq q24h. INR today is 1.03 and likely non-compliance with coumadin therapy. Patient noted on IV steroids and levaquin which may increase coumadin  sensitivity.  Home coumadin regimen: 2.5mg /day  Goal of Therapy:  INR 2-3 Monitor platelets by anticoagulation protocol: Yes   Plan:  -Coumadin 5mg  po today -Consider full dose lovenox with subtherapeutic INR -Daily PT/INR  Hildred Laser, Pharm D 04/24/2014 10:16 AM

## 2014-04-24 NOTE — Progress Notes (Addendum)
PULMONARY / CRITICAL CARE MEDICINE   Name: Linda [name] MRN: [id number] DOB: Dec 01, 1946    ADMISSION DATE:  04/23/2014 CONSULTATION DATE:  04/24/14   REFERRING MD :  Dr. Arnoldo Morale PRIMARY SERVICE: [facility]  CHIEF COMPLAINT:  Asthma eacerbation   BRIEF PATIENT DESCRIPTION: 68 y/o woman with history of asthma admitted with exacerbation likely related to URI.  SIGNIFICANT EVENTS / STUDIES:    LINES / TUBES: PIV  CULTURES: none  ANTIBIOTICS: Levoquin 5/3>>    SUBJECTIVE:  No  better  VITAL SIGNS: Temp:  [97.9 F (36.6 C)-98.4 F (36.9 C)] 97.9 F (36.6 C) (05/03 0056) Pulse Rate:  [75-93] 89 (05/03 0056) Resp:  [10-22] 22 (05/03 0056) BP: (91-132)/(51-79) 132/70 mmHg (05/03 0056) SpO2:  [88 %-100 %] 94 % (05/03 0056) Weight:  [74.844 kg (165 lb)] 74.844 kg (165 lb) (05/02 1729) FIO2 2lpm   HEMODYNAMICS:   VENTILATOR SETTINGS:   INTAKE / OUTPUT: Intake/Output   None     PHYSICAL EXAMINATION: General:  30 degrees HOB/  audible wheezing but able to speak in 4-5 word sentences. Neuro:  A&Ox3 HEENT:  PERRL, EOMI, OP clear Cardiovascular:  NRRR, no MRG Lungs:  Scattered wheezes throughout.  Prolonged expiratory phase.   Abdomen:  Soft, NTND, +BS Musculoskeletal:  Enlarged PIPs bilaterally, no other joint deformities, no CCE Skin:  No rashes, scattered bruises  LABS:  CBC  Recent Labs Lab 04/23/14 1800  WBC 6.9  HGB 10.3*  HCT 31.9*  PLT 187   Coag's  Recent Labs Lab 04/23/14 1800  INR 1.00   BMET  Recent Labs Lab 04/23/14 1800  NA 143  K 4.2  CL 105  CO2 25  BUN 20  CREATININE 1.10  GLUCOSE 96   Electrolytes  Recent Labs Lab 04/23/14 1800  CALCIUM 8.6   Sepsis Markers No results found for this basename: LATICACIDVEN, PROCALCITON, O2SATVEN,  in the last 168 hours ABG No results found for this basename: PHART, PCO2ART, PO2ART,  in the last 168 hours Liver Enzymes  Recent Labs Lab 04/23/14 1800  AST 72*  ALT  31  ALKPHOS 66  BILITOT 0.3  ALBUMIN 3.4*   Cardiac Enzymes  Recent Labs Lab 04/23/14 1800  PROBNP 801.7*   Glucose No results found for this basename: GLUCAP,  in the last 168 hours  Imaging Dg Chest 2 View  04/23/2014   CLINICAL DATA:  Shortness of breath. Cough. History of asthma and hypertension.  EXAM: CHEST  2 VIEW  COMPARISON:  04/22/2014.  FINDINGS: The cardiac silhouette remains borderline enlarged. Bilateral calcified pleural plaques are unchanged. No underlying airspace consolidation. Diffuse osteopenia.  IMPRESSION: No acute abnormality. Stable bilateral calcified pleural plaques compatible with previous asbestos exposure.   Electronically Signed   By: Enrique Sack M.D.   On: 04/23/2014 18:52   Dg Chest 2 View  04/22/2014   CLINICAL DATA:  Cough, shortness of breath, asthmatic bronchitis, basilar crackles, wheeze  EXAM: CHEST  2 VIEW  COMPARISON:  CT CHEST W/O CM dated 02/16/2014; DG CHEST 2 VIEW dated 02/11/2014; DG CHEST 1V PORT dated 09/13/2013  FINDINGS: Grossly unchanged borderline enlarged cardiac silhouette and mediastinal contours with atherosclerotic plaque within a mildly tortuous thoracic aorta. Extensive bilateral pleural calcifications are grossly unchanged though again degrade / obscure evaluation of the underlying pulmonary parenchyma. No discrete focal airspace opacities. No pleural effusion or pneumothorax. No definite evidence of edema. No acute osseus abnormalities.  IMPRESSION: Sequela of prior asbestos exposure without definite superimposed acute cardiopulmonary  disease.   Electronically Signed   By: Sandi Mariscal M.D.   On: 04/22/2014 15:24     ASSESSMENT / PLAN:  PULMONARY A:Asthma exacerbation with probably bronchitis P:   DDX of  difficult airways managment all start with A and  include Adherence, Ace Inhibitors, Acid Reflux, Active Sinus Disease, Alpha 1 Antitripsin deficiency, Anxiety masquerading as Airways dz,  ABPA,  allergy(esp in young), Aspiration  (esp in elderly), Adverse effects of DPI,  Active smokers, plus two Bs  = Bronchiectasis and Beta blocker use..and one C= CHF  In this case Adherence is the biggest issue and starts with  inability to use HFA effectively and also  understand that SABA treats the symptoms but doesn't get to the underlying problem (inflammation).  She appears to be overusing saba chronically and not on a "controlling" maint med for asthma   ? Acid (or non-acid) GERD > always difficult to exclude as up to 75% of pts in some series report no assoc GI/ Heartburn symptoms> rec max (24h)  acid suppression and diet restrictions/ reviewed and instructions given in writing.   ? Adverse effect from inhalers irritating her upper airways > change to nebs entirely for now  ? Anxiety > rx xanax and use flutter to prevent cyclical coughing   ? chf Lab Results  Component Value Date   PROBNP 801.7* 04/23/2014   unlikely   .  ? Beta blocker effect> very unlikely on low dose bystolic    Christinia Gully, MD Pulmonary and [place] [phone] After 5:30 PM or weekends, call [phone]

## 2014-04-24 NOTE — Progress Notes (Signed)
Patient admitted after midnight. Chart reviewed. Patient examined.   Callie Facey, MD  

## 2014-04-24 NOTE — Consult Note (Signed)
PULMONARY / CRITICAL CARE MEDICINE   Name: Linda [name] MRN: [id number] DOB: 02/24/1946    ADMISSION DATE:  04/23/2014 CONSULTATION DATE:  04/24/14   REFERRING MD :  Dr. Arnoldo Morale PRIMARY SERVICE: [facility]  CHIEF COMPLAINT:  Asthma eacerbation  BRIEF PATIENT DESCRIPTION: 68 y/o woman with history of asthma admitted with exacerbation likely related to URI.  SIGNIFICANT EVENTS / STUDIES:    LINES / TUBES: PIV  CULTURES: none  ANTIBIOTICS: Levoquin 5/3-  HISTORY OF PRESENT ILLNESS:  Linda [name] is a 68 y/o woman with a past medical history of asthma with multiple exacerbations requiring steroids and hospitalization in the past as well as fibromyalgia who developed cough and wheeze about 4 days prior to admission. She saw her primary care doctor the day prior to admission and was started on steroids and regular albuterol treatments but her symptoms continued to worsen.  She presented to the [facility] ED on 5/2 and was treated with continuous albuterol, steroids and magnesium and transferred to [facility] for admission by the hospitalist service.  She reports that she has had some fever over the last 2 days as well as some diarrhea, she denies chills or night sweats.  She has had a non-productive cough for the last 3 days.  She does have a history of bronchitis which has been treated successfully with levoquin in the past.  She is on coumadin for a history of PE in 2014.  Of note, she has a history of pleural plaques secondary to asbestos exposure.  She also reports having had a CT scan about 8 years ago that showed pulmonary nodules.  She has not had any repeat cross sectional imaging since then that she can recall.  Also of note: her husband passed away from malignant mesothelioma about 4 weeks ago.  She is just starting to deal with this loss and spent the vast majority of the time I was with her talking about his illness and his death.  She states that she has been trying to be  strong for her children and grandchildren and has not taken time to grieve.   PAST MEDICAL HISTORY :    has a past medical history of HTN (hypertension); Depression; Recurrent UTI; Hay fever; Mixed incontinence urge and stress; Diverticular disease; Insomnia; Fibromyalgia; Syncope; Idiopathic angio-edema-urticaria [id number]); [name]; Asthma; History of pneumonia; High cholesterol; Anginal pain; Shortness of breath; OSA on CPAP; Iron deficiency anemia; H/O hiatal hernia; Migraine syndrome; Tension headache, chronic; DDD (degenerative disc disease), lumbar; Osteoarthritis; DDD (degenerative disc disease), cervical; Chronic lower back pain; Anxiety; Panic attacks; Nephrolithiasis; Pyelonephritis; CHF (congestive heart failure); COPD (chronic obstructive pulmonary disease); Pulmonary embolism (07/2013); Pleural plaque with presence of asbestos (07/22/2013); and BPPV (benign paroxysmal positional vertigo) (12/16/2012).   Past Surgical History  Procedure Laterality Date  . Appendectomy  1960  . Total abdominal hysterectomy  1974  . Tendon release  1996    Right forearm and hand  . Knee surgery  2005  . Heel spur surgery Left 2008  . Plantar fascia release Left 2008  . Axillary surgery Left 1978    Multiple "lump" in armpit per pt  . Coccyx removal  1972  . Cardiac catheterization  01/2013    nonobstructive CAD, EF 55-60%  . Transthoracic echocardiogram  01/2013    NORMAL  . Dilation and curettage of uterus  ? 1970's  . Eye surgery Left 2012-2013    "injections for ~ 1 yr; don't really know what for" (07/12/2013)  Prior to Admission medications   Medication Sig Start Date End Date Taking? Authorizing Provider  albuterol (PROVENTIL HFA;VENTOLIN HFA) 108 (90 BASE) MCG/ACT inhaler 2 puffs.    Historical Provider, MD  albuterol (PROVENTIL) (2.5 MG/3ML) 0.083% nebulizer solution Take 3 mLs (2.5 mg total) by nebulization every 6 (six) hours as needed for wheezing or shortness of breath. 02/11/14    Tammi Sou, MD  ALPRAZolam Duanne Moron) 1 MG tablet 1/2-1 tab po tid as needed for anxiety or sleep 01/25/14   Tammi Sou, MD  amitriptyline (ELAVIL) 100 MG tablet TAKE 1 TABLET (100 MG TOTAL) BY MOUTH AT BEDTIME    Tammi Sou, MD  atorvastatin (LIPITOR) 20 MG tablet Take 1 tablet (20 mg total) by mouth daily at 6 PM. 08/30/13   Tammi Sou, MD  azithromycin (ZITHROMAX) 250 MG tablet 2 tabs po qd x1d, then 1 tab po qd x 4d 04/22/14   Tammi Sou, MD  furosemide (LASIX) 20 MG tablet TAKE 1 TABLET BY MOUTH EVERY DAY 03/02/14   Tammi Sou, MD  isosorbide mononitrate (IMDUR) 30 MG 24 hr tablet Take 1 tablet (30 mg total) by mouth daily. 11/02/13   Tammi Sou, MD  LYRICA 300 MG capsule TAKE ONE CAPSULE BY MOUTH TWICE A DAY 03/18/14   Tammi Sou, MD  meloxicam (MOBIC) 7.5 MG tablet Take 7.5 mg by mouth 2 (two) times daily.  09/20/13   Historical Provider, MD  Milnacipran (SAVELLA) 50 MG TABS tablet Take 1 tablet (50 mg total) by mouth 2 (two) times daily. 01/10/14   Tammi Sou, MD  Multiple Vitamins-Minerals (CENTRUM SILVER) tablet Take 1 tablet by mouth.    Historical Provider, MD  nebivolol (BYSTOLIC) 5 MG tablet Take 1 tablet (5 mg total) by mouth daily. 01/17/14   Tammi Sou, MD  nitroGLYCERIN (NITROSTAT) 0.4 MG SL tablet Place 1 tablet (0.4 mg total) under the tongue every 5 (five) minutes x 3 doses as needed for chest pain. 07/19/13   Reyne Dumas, MD  predniSONE (DELTASONE) 20 MG tablet 2 tabs po qd x 5d, then 1 tab po qd x 5d 04/22/14   Tammi Sou, MD  temazepam (RESTORIL) 7.5 MG capsule TAKE AT BEDTIME IF NEEDED FOR SLEEP 04/10/14   Volanda Napoleon, MD  traZODone (DESYREL) 50 MG tablet Take 2-4 tabs po qhs for insomnia 12/28/13   Tammi Sou, MD  warfarin (COUMADIN) 5 MG tablet Take as directed by MD 09/08/13   Tammi Sou, MD   Allergies  Allergen Reactions  . Penicillins Itching, Swelling and Rash    Tolerated Cefepime in ED.    FAMILY  HISTORY:  Family History  Problem Relation Age of Onset  . Arthritis Mother   . Kidney disease Mother   . Heart disease Father   . Stroke Father   . Hypertension Father   . Diabetes Father    SOCIAL HISTORY:  reports that she has never smoked. She has never used smokeless tobacco. She reports that she does not drink alcohol or use illicit drugs.  REVIEW OF SYSTEMS:  A review of 14 systems was negative except as stated in the HPI.   SUBJECTIVE:   VITAL SIGNS: Temp:  [97.9 F (36.6 C)-98.4 F (36.9 C)] 97.9 F (36.6 C) (05/03 0056) Pulse Rate:  [75-93] 89 (05/03 0056) Resp:  [10-22] 22 (05/03 0056) BP: (91-132)/(51-79) 132/70 mmHg (05/03 0056) SpO2:  [88 %-100 %] 94 % (05/03 0056) Weight:  [  74.844 kg (165 lb)] 74.844 kg (165 lb) (05/02 1729) HEMODYNAMICS:   VENTILATOR SETTINGS:   INTAKE / OUTPUT: Intake/Output   None     PHYSICAL EXAMINATION: General:  Laying in bed, audible wheezing but able to speak in 4-5 word sentences. Neuro:  A&Ox3 HEENT:  PERRL, EOMI, OP clear Cardiovascular:  NRRR, no MRG Lungs:  Scattered wheezes throughout.  Prolonged expiratory phase.   Abdomen:  Soft, NTND, +BS Musculoskeletal:  Enlarged PIPs bilaterally, no other joint deformities, no CCE Skin:  No rashes, scattered bruises  LABS:  CBC  Recent Labs Lab 04/23/14 1800  WBC 6.9  HGB 10.3*  HCT 31.9*  PLT 187   Coag's  Recent Labs Lab 04/23/14 1800  INR 1.00   BMET  Recent Labs Lab 04/23/14 1800  NA 143  K 4.2  CL 105  CO2 25  BUN 20  CREATININE 1.10  GLUCOSE 96   Electrolytes  Recent Labs Lab 04/23/14 1800  CALCIUM 8.6   Sepsis Markers No results found for this basename: LATICACIDVEN, PROCALCITON, O2SATVEN,  in the last 168 hours ABG No results found for this basename: PHART, PCO2ART, PO2ART,  in the last 168 hours Liver Enzymes  Recent Labs Lab 04/23/14 1800  AST 72*  ALT 31  ALKPHOS 66  BILITOT 0.3  ALBUMIN 3.4*   Cardiac Enzymes  Recent  Labs Lab 04/23/14 1800  PROBNP 801.7*   Glucose No results found for this basename: GLUCAP,  in the last 168 hours  Imaging Dg Chest 2 View  04/23/2014   CLINICAL DATA:  Shortness of breath. Cough. History of asthma and hypertension.  EXAM: CHEST  2 VIEW  COMPARISON:  04/22/2014.  FINDINGS: The cardiac silhouette remains borderline enlarged. Bilateral calcified pleural plaques are unchanged. No underlying airspace consolidation. Diffuse osteopenia.  IMPRESSION: No acute abnormality. Stable bilateral calcified pleural plaques compatible with previous asbestos exposure.   Electronically Signed   By: Enrique Sack M.D.   On: 04/23/2014 18:52   Dg Chest 2 View  04/22/2014   CLINICAL DATA:  Cough, shortness of breath, asthmatic bronchitis, basilar crackles, wheeze  EXAM: CHEST  2 VIEW  COMPARISON:  CT CHEST W/O CM dated 02/16/2014; DG CHEST 2 VIEW dated 02/11/2014; DG CHEST 1V PORT dated 09/13/2013  FINDINGS: Grossly unchanged borderline enlarged cardiac silhouette and mediastinal contours with atherosclerotic plaque within a mildly tortuous thoracic aorta. Extensive bilateral pleural calcifications are grossly unchanged though again degrade / obscure evaluation of the underlying pulmonary parenchyma. No discrete focal airspace opacities. No pleural effusion or pneumothorax. No definite evidence of edema. No acute osseus abnormalities.  IMPRESSION: Sequela of prior asbestos exposure without definite superimposed acute cardiopulmonary disease.   Electronically Signed   By: Sandi Mariscal M.D.   On: 04/22/2014 15:24     ASSESSMENT / PLAN:  PULMONARY A:Asthma exacerbation with probably bronchitis P:   Increase albuterol nebs to q4 scheduled with ipratroprium added for the first 24 hours Albuterol nebs q2 prn Start levoquin 500mg  daily for 7 days for bronchitis Start symbicort 180/4.5 2 puffs twice daily with spacer. Would wean steroids to 60mg  daily for 5 days then taper given her history of frequent  exacerbations, 40mg  daily for 5 days, 20mg  daily for 5 days, then stop.  In terms of asbestos and pulmonary nodules: She should have a repeat CT scan WHEN RECOVERED from her acute illness to follow her nodules Given the severity of her asthma she should likely be followed by a pulmonogist    NEUROLOGIC  A:  Insomnia, grief  P:   Gave 1 time dose of ambien tonight.  She has chronic insomnia and this has worked in the past.  She would likely benefit from counseling given all that she has been through in the last year.     I have personally obtained a history, examined the patient, evaluated laboratory and imaging results, formulated the assessment and plan and placed orders. CRITICAL CARE: The patient is critically ill with multiple organ systems failure and requires high complexity decision making for assessment and support, frequent evaluation and titration of therapies, application of advanced monitoring technologies and extensive interpretation of multiple databases. Critical Care Time devoted to patient care services described in this note is 35 minutes.   Reginia Forts MD, PhD Pulmonary and [place] Pager: [phone]  04/24/2014, 3:46 AM

## 2014-04-24 NOTE — H&P (Signed)
[facility] History and Physical  Linda [name] [id number] DOB: 08-29-1946 DOA: 04/23/2014  Referring physician: EDP PCP: Tammi Sou, MD  Specialists:   Chief Complaint: Worsening SOB and Wheezing  HPI: Linda [name] is a 68 y.o. female with a history of Asthma, Diastolic CHF, HTN, OSA, and Fibromyalgia who presented to the [facility] ED with complaints of worsening SOB, Wheezing Cough and Chest Tightness over the past 3-4 days.   She reports having increased coughing but her chest is so tight she can not cough up the sputum.   She denies any fever or chills.   She was seen by her PCP on 05/01 and placed on a Z-pack and prednisone taper but her symptoms continued to worsen.   She was mildly hypoxic in the ED with O2 sats of 90%, she was administered continuous nebulizer treatments along with IV Steroids, and placed on supplemental O2 at 2 liters and had mild improvement with an o2 sat of 94%.   She was transferred to [facility] for admission.       Review of Systems:  Constitutional: No Weight Loss, No Weight Gain, Night Sweats, Fevers, Chills, Fatigue, or Generalized Weakness HEENT: No Headaches, Difficulty Swallowing,Tooth/Dental Problems,Sore Throat,  No Sneezing, Rhinitis, Ear Ache, Nasal Congestion, or Post Nasal Drip,  Cardio-vascular:  No Chest pain, Orthopnea, PND, Edema in lower extremities, Anasarca, Dizziness, Palpitations  Resp: +Dyspnea, No DOE, +Non-Productive Cough, No Hemoptysis, +Wheezing.    GI: No Heartburn, Indigestion, Abdominal Pain, Nausea, Vomiting, Diarrhea, Change in Bowel Habits,  Loss of Appetite  GU: No Dysuria, Change in Color of Urine, No Urgency or Frequency.  No flank pain.  Musculoskeletal: +Joint Pain or Swelling.  No Decreased Range of Motion. No +Chronic Low Back Pain.  Neurologic: No Syncope, No Seizures, Muscle Weakness, Paresthesia, Vision Disturbance or Loss, No Diplopia, No Vertigo, No Difficulty Walking,  Skin: No Rash or  Lesions. Psych: No Change in Mood or Affect. No Depression or Anxiety. No Memory loss. No Confusion or Hallucinations, +Insomnia.       Past Medical History  Diagnosis Date  . HTN (hypertension)   . Depression   . Recurrent UTI     +hx of hospitalization for pyelonephritis  . Hay fever   . Mixed incontinence urge and stress   . Diverticular disease   . Insomnia   . Fibromyalgia     Patient states dx was around her late 32s but she had sx's for years prior to this.  . Syncope     +Hypotensive; ED visit--Dr. Terrence Dupont did Cath--nonobstructive CAD, EF 55-60%  . Idiopathic angio-edema-urticaria 72014    Angioedema component was very minimal  . 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 Asthma     + asbestososis   . History of pneumonia     hospitalized 12/2011, 02/2013, and 07/2013 [facility]) for this  . High cholesterol     "I'm on Lipitor cause it will help my heart in some way" (07/12/2013)  . Anginal pain     Nonobstructive CAD 2014  . Shortness of breath     "all the time recently" (07/12/2013)  . OSA on CPAP   . Iron deficiency anemia     Hematologist in [place], [place] did extensive w/u; no cause found; failed oral supplement;; gets fairly regular (q5m or so) IV iron infusions (Venofer (iron sucrose) 200mg  with procrit.  "  for 14 yr I've been getting blood work q month & getting infusions prn" (07/12/2013).  Dr. Marin Olp locally, iron infusions done, then erythropoietin deficiency diagnosed: aranesp to be started as of 09/2013.  . H/O hiatal hernia   . Migraine syndrome     "not as often anymore; used to be ~ q wk" (07/12/2013)  . Tension headache, chronic   . DDD (degenerative disc disease), lumbar   . Osteoarthritis     "severe; progressing fast" (07/12/2013)  . DDD (degenerative disc disease), cervical   . Chronic lower back pain   . Anxiety   . Panic attacks   . Nephrolithiasis     "passed  all on my own or they are still in there" (07/12/2013)  . Pyelonephritis     "several times over the last 30 yr" (07/12/2013)  . CHF (congestive heart failure)     Diastolic  . COPD (chronic obstructive pulmonary disease)   . Pulmonary embolism 07/2013    coumadin needed x 6 mo; Dx at [facility] with very small peripheral upper lobe pe 07/2013    . Pleural plaque with presence of asbestos 07/22/2013  . BPPV (benign paroxysmal positional vertigo) 12/16/2012     Past Surgical History  Procedure Laterality Date  . Appendectomy  1960  . Total abdominal hysterectomy  1974  . Tendon release  1996    Right forearm and hand  . Knee surgery  2005  . Heel spur surgery Left 2008  . Plantar fascia release Left 2008  . Axillary surgery Left 1978    Multiple "lump" in armpit per pt  . Coccyx removal  1972  . Cardiac catheterization  01/2013    nonobstructive CAD, EF 55-60%  . Transthoracic echocardiogram  01/2013    NORMAL  . Dilation and curettage of uterus  ? 1970's  . Eye surgery Left 2012-2013    "injections for ~ 1 yr; don't really know what for" (07/12/2013)      Prior to Admission medications   Medication Sig Start Date End Date Taking? Authorizing Provider  albuterol (PROVENTIL HFA;VENTOLIN HFA) 108 (90 BASE) MCG/ACT inhaler 2 puffs.    Historical Provider, MD  albuterol (PROVENTIL) (2.5 MG/3ML) 0.083% nebulizer solution Take 3 mLs (2.5 mg total) by nebulization every 6 (six) hours as needed for wheezing or shortness of breath. 02/11/14   Tammi Sou, MD  ALPRAZolam Duanne Moron) 1 MG tablet 1/2-1 tab po tid as needed for anxiety or sleep 01/25/14   Tammi Sou, MD  amitriptyline (ELAVIL) 100 MG tablet TAKE 1 TABLET (100 MG TOTAL) BY MOUTH AT BEDTIME    Tammi Sou, MD  atorvastatin (LIPITOR) 20 MG tablet Take 1 tablet (20 mg total) by mouth daily at 6 PM. 08/30/13   Tammi Sou, MD  azithromycin (ZITHROMAX) 250 MG tablet 2 tabs po qd x1d, then 1 tab po qd x 4d 04/22/14   Tammi Sou, MD  furosemide (LASIX) 20 MG tablet TAKE 1 TABLET BY MOUTH EVERY DAY 03/02/14   Tammi Sou, MD  isosorbide mononitrate (IMDUR) 30 MG 24 hr tablet Take 1 tablet (30 mg total) by mouth daily. 11/02/13   Tammi Sou, MD  LYRICA 300 MG capsule TAKE ONE CAPSULE BY MOUTH TWICE A DAY 03/18/14   Tammi Sou, MD  meloxicam (MOBIC) 7.5 MG tablet Take 7.5 mg by mouth 2 (two) times daily.  09/20/13   Historical Provider, MD  Milnacipran (SAVELLA) 50 MG TABS tablet Take 1  tablet (50 mg total) by mouth 2 (two) times daily. 01/10/14   Tammi Sou, MD  Multiple Vitamins-Minerals (CENTRUM SILVER) tablet Take 1 tablet by mouth.    Historical Provider, MD  nebivolol (BYSTOLIC) 5 MG tablet Take 1 tablet (5 mg total) by mouth daily. 01/17/14   Tammi Sou, MD  nitroGLYCERIN (NITROSTAT) 0.4 MG SL tablet Place 1 tablet (0.4 mg total) under the tongue every 5 (five) minutes x 3 doses as needed for chest pain. 07/19/13   Reyne Dumas, MD  predniSONE (DELTASONE) 20 MG tablet 2 tabs po qd x 5d, then 1 tab po qd x 5d 04/22/14   Tammi Sou, MD  temazepam (RESTORIL) 7.5 MG capsule TAKE AT BEDTIME IF NEEDED FOR SLEEP 04/10/14   Volanda Napoleon, MD  traZODone (DESYREL) 50 MG tablet Take 2-4 tabs po qhs for insomnia 12/28/13   Tammi Sou, MD  warfarin (COUMADIN) 5 MG tablet Take as directed by MD 09/08/13   Tammi Sou, MD      Allergies  Allergen Reactions  . Penicillins Itching, Swelling and Rash    Tolerated Cefepime in ED.     Social History:  reports that she has never smoked. She has never used smokeless tobacco. She reports that she does not drink alcohol or use illicit drugs.     Family History  Problem Relation Age of Onset  . Arthritis Mother   . Kidney disease Mother   . Heart disease Father   . Stroke Father   . Hypertension Father   . Diabetes Father        Physical Exam:  GEN:  Pleasant Well Nourished adn Well developed Elderly 68 y.o. Caucasian female  examined and in no acute distress; cooperative with exam Filed Vitals:   04/23/14 2153 04/23/14 2248 04/23/14 2347 04/24/14 0056  BP: 106/54 91/55 110/79 132/70  Pulse: 86 90 93 89  Temp:    97.9 F (36.6 C)  TempSrc:    Oral  Resp: 16 21 22 22   Height:      Weight:      SpO2: 100% 100% 98% 94%   Blood pressure 132/70, pulse 89, temperature 97.9 F (36.6 C), temperature source Oral, resp. rate 22, height 5\' 4"  (1.626 m), weight 74.844 kg (165 lb), SpO2 94.00%. PSYCH: She is alert and oriented x4; does not appear anxious does not appear depressed; affect is normal HEENT: Normocephalic and Atraumatic, Mucous membranes pink; PERRLA; EOM intact; Fundi:  Benign;  No scleral icterus, Nares: Patent, Oropharynx: Clear, Fair Dentition, Neck:  FROM, no cervical lymphadenopathy nor thyromegaly or carotid bruit; no JVD; Breasts:: Not examined CHEST WALL: No tenderness CHEST:  Tachypneic, Decreased Breath Sounds Bilaterally, Diffuse Expiratory Wheezing, No Rales.    HEART: Regular rate and rhythm; no murmurs rubs or gallops BACK: No kyphosis or scoliosis; no CVA tenderness ABDOMEN: Positive Bowel Sounds, soft non-tender; no masses, no organomegaly. Rectal Exam: Not done EXTREMITIES: No cyanosis, clubbing or edema; no ulcerations. Genitalia: not examined PULSES: 2+ and symmetric SKIN: Normal hydration no rash or ulceration CNS:  Alert and Oriented X 4,  No Focal Deficits.    Vascular: pulses palpable throughout    Labs on Admission:  Basic Metabolic Panel:  Recent Labs Lab 04/23/14 1800  NA 143  K 4.2  CL 105  CO2 25  GLUCOSE 96  BUN 20  CREATININE 1.10  CALCIUM 8.6   Liver Function Tests:  Recent Labs Lab 04/23/14 1800  AST 72*  ALT 31  ALKPHOS 66  BILITOT 0.3  PROT 6.7  ALBUMIN 3.4*   No results found for this basename: LIPASE, AMYLASE,  in the last 168 hours No results found for this basename: AMMONIA,  in the last 168 hours CBC:  Recent Labs Lab 04/23/14 1800   WBC 6.9  NEUTROABS 4.4  HGB 10.3*  HCT 31.9*  MCV 92.5  PLT 187   Cardiac Enzymes: No results found for this basename: CKTOTAL, CKMB, CKMBINDEX, TROPONINI,  in the last 168 hours  BNP (last 3 results)  Recent Labs  07/25/13 1215 09/13/13 1520 04/23/14 1800  PROBNP 499.9* 262.4* 801.7*   CBG: No results found for this basename: GLUCAP,  in the last 168 hours  Radiological Exams on Admission: Dg Chest 2 View  04/23/2014   CLINICAL DATA:  Shortness of breath. Cough. History of asthma and hypertension.  EXAM: CHEST  2 VIEW  COMPARISON:  04/22/2014.  FINDINGS: The cardiac silhouette remains borderline enlarged. Bilateral calcified pleural plaques are unchanged. No underlying airspace consolidation. Diffuse osteopenia.  IMPRESSION: No acute abnormality. Stable bilateral calcified pleural plaques compatible with previous asbestos exposure.   Electronically Signed   By: Enrique Sack M.D.   On: 04/23/2014 18:52   Dg Chest 2 View  04/22/2014   CLINICAL DATA:  Cough, shortness of breath, asthmatic bronchitis, basilar crackles, wheeze  EXAM: CHEST  2 VIEW  COMPARISON:  CT CHEST W/O CM dated 02/16/2014; DG CHEST 2 VIEW dated 02/11/2014; DG CHEST 1V PORT dated 09/13/2013  FINDINGS: Grossly unchanged borderline enlarged cardiac silhouette and mediastinal contours with atherosclerotic plaque within a mildly tortuous thoracic aorta. Extensive bilateral pleural calcifications are grossly unchanged though again degrade / obscure evaluation of the underlying pulmonary parenchyma. No discrete focal airspace opacities. No pleural effusion or pneumothorax. No definite evidence of edema. No acute osseus abnormalities.  IMPRESSION: Sequela of prior asbestos exposure without definite superimposed acute cardiopulmonary disease.   Electronically Signed   By: Sandi Mariscal M.D.   On: 04/22/2014 15:24      EKG: Independently reviewed.     Assessment/Plan:   68 y.o. female with  Principal Problem:   Asthma  exacerbation Active Problems:   History of iron deficiency anemia   Fibromyalgia syndrome   Pleural plaque with presence of asbestos   Unspecified essential hypertension   OSA (obstructive sleep apnea)   Diastolic CHF   Anemia   Chronic Pain    Insomnia   1.   Asthma Exacerbation-   Albuterol Nebs, High dose IV Steroid Taper, and Ubly O2 ,  Also given IV Magnesium 1 gram X 1 for Bronchospasm.  Consulted Pulmonary.     2.   Diastolic CHF-   On lasix 20 mg Daily, currently compensated.  BNP=801.0  3.   Pleural Plaques form [name]-  Stable.     4.   OSA-  Remote hx of CPAP usage,  Not currently using.     5.   Anemia-   Seeing Dr. Marin Olp Hematologist.  Send Anemia panel, and FOBT.     6.   Fibromyalgia/ and Chronic Pain-  Continue Savella, Lyrica Rx.  Discontinue Elavil rx.  Meloxicam discontinued while on Steroid Rx.     7.   HTN-  On Bystolic Rx, monitor BPs.     8.   Hx of PE since 08/2013, was to take for 6 months, but has been sub-therapeutic so she is still on Coumadin Rx.  Check PT/INR, and pharmacy adjustment of Coumadin  rx.    9.   Lovenox  For DVT prophylaxis, change to Full dose Lovenox if PT/INR sub-therapeutic.     10.   Insomnia-  Adjust Insomnia Medications. Discontinue Trazodone - Pt said it is not working,  Discontinue Restoril- Patient is no longer taking this med.   Continue Xanax BID and qhs PRN.        Code Status:    FULL CODE   Family Communication:    No Family Present Disposition Plan:    Inpatient     Time spent:  [facility] Pager [phone]  If 7PM-7AM, please contact night-coverage [email or web address] Password [email or web address] 04/24/2014, 1:39 AM

## 2014-04-24 NOTE — Progress Notes (Signed)
Patient admitted to unit [phone] by stretcher via [facility]. Patient alert and oriented x 4. Belongings at bedside. No family at bedside. Skin intact, ecchymotic areas to patient arms. Patient states that she is on coumadin and bruises easily. IV intact. Patient informed RN that her husband passed away a few weeks ago but does not feel hopeless or suicidal. She also states that she fell about 9 or 10 months ago, but has not since. Went over fall safety prevention with patient, patient instructed on how to use call bell and room phone to gain contact with staff. No orders currently, [facility] admissions paged. Awaiting MD orders. Will continue to monitor patient.

## 2014-04-25 ENCOUNTER — Encounter (HOSPITAL_COMMUNITY): Payer: Self-pay | Admitting: Radiology

## 2014-04-25 ENCOUNTER — Inpatient Hospital Stay (HOSPITAL_COMMUNITY): Payer: Medicare Other

## 2014-04-25 DIAGNOSIS — J45909 Unspecified asthma, uncomplicated: Secondary | ICD-10-CM

## 2014-04-25 DIAGNOSIS — R0609 Other forms of dyspnea: Secondary | ICD-10-CM

## 2014-04-25 DIAGNOSIS — J45901 Unspecified asthma with (acute) exacerbation: Secondary | ICD-10-CM | POA: Diagnosis not present

## 2014-04-25 DIAGNOSIS — IMO0001 Reserved for inherently not codable concepts without codable children: Secondary | ICD-10-CM

## 2014-04-25 DIAGNOSIS — R0602 Shortness of breath: Secondary | ICD-10-CM | POA: Diagnosis not present

## 2014-04-25 DIAGNOSIS — R0989 Other specified symptoms and signs involving the circulatory and respiratory systems: Secondary | ICD-10-CM

## 2014-04-25 DIAGNOSIS — D649 Anemia, unspecified: Secondary | ICD-10-CM | POA: Diagnosis not present

## 2014-04-25 HISTORY — PX: SPIROMETRY: SHX456

## 2014-04-25 LAB — GLUCOSE, CAPILLARY
GLUCOSE-CAPILLARY: 169 mg/dL — AB (ref 70–99)
Glucose-Capillary: 124 mg/dL — ABNORMAL HIGH (ref 70–99)
Glucose-Capillary: 104 mg/dL — ABNORMAL HIGH (ref 70–99)

## 2014-04-25 LAB — PROTIME-INR
INR: 1.08 (ref 0.00–1.49)
Prothrombin Time: 13.8 seconds (ref 11.6–15.2)

## 2014-04-25 LAB — D-DIMER, QUANTITATIVE: D-Dimer, Quant: 1.11 ug/mL-FEU — ABNORMAL HIGH (ref 0.00–0.48)

## 2014-04-25 MED ORDER — PREDNISONE 20 MG PO TABS
40.0000 mg | ORAL_TABLET | Freq: Every day | ORAL | Status: DC
  Administered 2014-04-25 – 2014-04-28 (×4): 40 mg via ORAL
  Filled 2014-04-25 (×5): qty 2

## 2014-04-25 MED ORDER — ENOXAPARIN SODIUM 80 MG/0.8ML ~~LOC~~ SOLN
75.0000 mg | Freq: Two times a day (BID) | SUBCUTANEOUS | Status: DC
  Administered 2014-04-25 – 2014-04-27 (×6): 75 mg via SUBCUTANEOUS
  Filled 2014-04-25 (×9): qty 0.8

## 2014-04-25 MED ORDER — WARFARIN SODIUM 5 MG PO TABS
5.0000 mg | ORAL_TABLET | Freq: Once | ORAL | Status: AC
  Administered 2014-04-25: 5 mg via ORAL
  Filled 2014-04-25: qty 1

## 2014-04-25 MED ORDER — IOHEXOL 350 MG/ML SOLN
100.0000 mL | Freq: Once | INTRAVENOUS | Status: AC | PRN
Start: 2014-04-25 — End: 2014-04-25
  Administered 2014-04-25: 100 mL via INTRAVENOUS

## 2014-04-25 MED ORDER — INSULIN ASPART 100 UNIT/ML ~~LOC~~ SOLN
0.0000 [IU] | Freq: Three times a day (TID) | SUBCUTANEOUS | Status: DC
  Administered 2014-04-25: 3 [IU] via SUBCUTANEOUS
  Administered 2014-04-25: 2 [IU] via SUBCUTANEOUS
  Administered 2014-04-26: 3 [IU] via SUBCUTANEOUS
  Administered 2014-04-27: 2 [IU] via SUBCUTANEOUS

## 2014-04-25 MED ORDER — INSULIN ASPART 100 UNIT/ML ~~LOC~~ SOLN: 0.0000 [IU] | Freq: Every day | SUBCUTANEOUS | Status: DC

## 2014-04-25 NOTE — Progress Notes (Signed)
ANTICOAGULATION CONSULT NOTE - Follow Up Consult  Pharmacy Consult for Lovenox and Coumadin Indication: hx pulmonary embolus  Allergies  Allergen Reactions  . Penicillins Itching, Swelling and Rash    Tolerated Cefepime in ED.    Patient Measurements: Height: 5\' 4"  (162.6 cm) Weight: 165 lb (74.844 kg) IBW/kg (Calculated) : 54.7  Vital Signs: Temp: 97.8 F (36.6 C) (05/04 0800) Temp src: Oral (05/04 0800) BP: 159/83 mmHg (05/04 0800) Pulse Rate: 92 (05/04 0924)  Labs:  Recent Labs  04/23/14 1800 04/24/14 0711 04/24/14 0856 04/25/14 0510  HGB 10.3* 10.1*  --   --   HCT 31.9* 31.0*  --   --   PLT 187 166  --   --   LABPROT 13.0  --  13.3 13.8  INR 1.00  --  1.03 1.08  CREATININE 1.10 0.64  --   --     Estimated Creatinine Clearance: 66.6 ml/min (by C-G formula based on Cr of 0.64).  Assessment:  On Coumadin prior to admission for hx PE in Sept 2014.  INR was 1.0 on admission 5/2.  Home Coumadin dose: 2.5 mg daily, though per outpatient notes, it has been difficult to manage.  Has had Coumadin 5 mg daily x 2 days.  INR remains subtherapeutic (1.08). Prophylactic Lovenox increased to treatment dose this morning.  Day # 2 Levaquin; sometimes effects INR.  IV steroids changed to PO today. Off home Meloxicam.  Goal of Therapy:  INR 2-3 Heparin level 0.6-1.2 units/ml, 4 hrs after Lovenox dose Monitor platelets by anticoagulation protocol: Yes   Plan:   Coumadin 5 mg PO again today.  Lovenox 75 mg SQ q12hrs.  Daily PT/INR.  CBC at least every 3 days while on Lovenox.  Will consider increasing Coumadin dose on 5/5 if INR not increasing.  Levaquin may effect INR to some extent.   Arty Baumgartner, [place] Pager: [phone] 04/25/2014,11:32 AM

## 2014-04-25 NOTE — Progress Notes (Signed)
Chaplain responded to spiritual care consult. Pt shared story of her husband's health journey and death, four weeks ago. Chaplain listened empathically, providing emotional and grief support, as pt shared this story and processed her own grief and health challenges. Will follow up with patient to continue grief support.   Alice [phone]

## 2014-04-25 NOTE — Progress Notes (Addendum)
PULMONARY / CRITICAL CARE MEDICINE   Name: Linda [name] MRN: [id number] DOB: 02-16-1946    ADMISSION DATE:  04/23/2014 CONSULTATION DATE:  04/24/14   REFERRING MD :  Dr. Arnoldo Morale PRIMARY SERVICE: [facility]  CHIEF COMPLAINT:  Asthma eacerbation   BRIEF PATIENT DESCRIPTION: 68 y/o woman with history of asthma admitted with exacerbation likely related to URI.  SIGNIFICANT EVENTS / STUDIES:    LINES / TUBES: PIV  CULTURES: none  ANTIBIOTICS: Levaquin 5/3>>    SUBJECTIVE:  Feels a little better.  Still with "nagging" cough. Denies SOB at rest.   Staff note: appears to be on 4 - [place]: Filed Vitals:   04/25/14 0528 04/25/14 0653 04/25/14 0800 04/25/14 0924  BP: 174/90 143/78 159/83   Pulse: 90  92 92  Temp: 97.5 F (36.4 C)  97.8 F (36.6 C)   TempSrc: Oral  Oral   Resp: 18  18 18   Height:      Weight:      SpO2: 94%  93% 93%  2L [place]    INTAKE / OUTPUT:  Intake/Output Summary (Last 24 hours) at 04/25/14 1121 Last data filed at 04/25/14 1002  Gross per 24 hour  Intake    360 ml  Output      0 ml  Net    360 ml     PHYSICAL EXAMINATION: General:  Chronically ill appearing female, NAD  Neuro:  A&Ox3 HEENT:  PERRL, EOMI, OP clear Cardiovascular:  NRRR, no MRG Lungs:  resps even non labored, few scattered wheezes, speaks full snetences.   Abdomen:  Soft, NTND, +BS Musculoskeletal:  Enlarged PIPs bilaterally, no other joint deformities, no CCE Skin:  No rashes, scattered bruises  PULMONARY No results found for this basename: PHART, PCO2, PCO2ART, PO2, PO2ART, HCO3, TCO2, O2SAT,  in the last 168 hours  CBC  Recent Labs Lab 04/23/14 1800 04/24/14 0711  HGB 10.3* 10.1*  HCT 31.9* 31.0*  WBC 6.9 6.5  PLT 187 166    COAGULATION  Recent Labs Lab 04/23/14 1800 04/24/14 0856 04/25/14 0510  INR 1.00 1.03 1.08    CARDIAC  No results found for this basename: TROPONINI,  in the last 168 hours  Recent Labs Lab  04/23/14 1800  PROBNP 801.7*     CHEMISTRY  Recent Labs Lab 04/23/14 1800 04/24/14 0711  NA 143 140  K 4.2 4.0  CL 105 103  CO2 25 23  GLUCOSE 96 172*  BUN 20 17  CREATININE 1.10 0.64  CALCIUM 8.6 8.7   Estimated Creatinine Clearance: 66.6 ml/min (by C-G formula based on Cr of 0.64).   LIVER  Recent Labs Lab 04/23/14 1800 04/24/14 0856 04/25/14 0510  AST 72*  --   --   ALT 31  --   --   ALKPHOS 66  --   --   BILITOT 0.3  --   --   PROT 6.7  --   --   ALBUMIN 3.4*  --   --   INR 1.00 1.03 1.08     INFECTIOUS No results found for this basename: LATICACIDVEN, PROCALCITON,  in the last 168 hours   ENDOCRINE CBG (last 3)   Recent Labs  04/25/14 1140  GLUCAP 124*         IMAGING x48h  Dg Chest 2 View  04/23/2014   CLINICAL DATA:  Shortness of breath. Cough. History of asthma and hypertension.  EXAM: CHEST  2 VIEW  COMPARISON:  04/22/2014.  FINDINGS: The cardiac silhouette remains borderline enlarged. Bilateral calcified pleural plaques are unchanged. No underlying airspace consolidation. Diffuse osteopenia.  IMPRESSION: No acute abnormality. Stable bilateral calcified pleural plaques compatible with previous asbestos exposure.   Electronically Signed   By: Enrique Sack M.D.   On: 04/23/2014 18:52      ASSESSMENT / PLAN:  PULMONARY A:Asthma exacerbation with probably bronchitis (used to be followed in [place], [place])_   - staff MD: No wheezing but appears to be on 4-6L [place]. Has had PE ruled out March and July 2014 but she says she had PE 1 year ago at [facility] but they opted not to treat it because was small  P:   Staff MD comment: check d-dimer - if high do CT angio chest rule out PE. Also recheck bnp and repeat spirometry  Cont nebs for now and avoid upper airway irritation from MDI - symbicort at d/c  Max GERD rx  Cont levaquin  Steroids with taper  Repeat CT scan as outpt when acute illness recovered to f/u nodules  Anxiety control  outpt  pulmonary f/u    Marijean Heath, NP 04/25/2014  11:21 AM Pager: [phone] or [phone]  *Care during the described time interval was provided by me and/or other providers on the critical care team. I have reviewed this patient's available data, including medical history, events of note, physical examination and test results as part of my evaluation.   Dr. Brand Males, M.D., [facility].C.P Pulmonary and Critical Care Medicine Staff Physician [place] [facility] Pager: [phone], If no answer or between  15:00h - 7:00h: call [phone]  04/25/2014 11:54 AM

## 2014-04-25 NOTE — Progress Notes (Signed)
PROGRESS NOTE  Linda [name] ZOX:[id number] DOB: Oct 25, 1946 DOA: 04/23/2014 PCP: Tammi Sou, MD  Assessment/Plan: Asthma Exacerbation- Nebs, add oral prednisone, and [place] O2  -pulm consult: Adherence is the biggest issue and starts with inability to use HFA effectively and also understand that SABA treats the symptoms but doesn't get to the underlying problem (inflammation). She appears to be overusing saba chronically and not on a "controlling" maint med for asthma    Diastolic CHF- On lasix 20 mg Daily, currently compensated. BNP=801.0   Pleural Plaques form [name]- Stable.   OSA- Remote hx of CPAP usage, Not currently using.   Anemia- Seeing Dr. Marin Olp Hematologist. Ferritin high  Fibromyalgia/ and Chronic Pain- Continue Savella, Lyrica Rx. Discontinue Elavil rx. Meloxicam discontinued while on Steroid Rx.   HTN- On Bystolic Rx, monitor BPs.   Hx of PE since 08/2013, was to take for 6 months, but has been sub-therapeutic so she is still on Coumadin Rx. INR sub therapeutic- add full dose lovenox   Insomnia- Adjust Insomnia Medications. Discontinue Trazodone - Pt said it is not working, Discontinue Restoril- Patient is no longer taking this med. Continue Xanax BID and qhs PRN.   Hyperglycemia -add SSI -on steroids  Code Status: full Family Communication: patient Disposition Plan: home 1-2 days   Consultants:  pulm  Procedures:  none    HPI/Subjective: Recent death of her husband + cough  Objective: Filed Vitals:   04/25/14 0800  BP: 159/83  Pulse: 92  Temp: 97.8 F (36.6 C)  Resp: 18   No intake or output data in the 24 hours ending 04/25/14 0818 Filed Weights   04/23/14 1729  Weight: 74.844 kg (165 lb)    Exam:   General:  Pleasant/cooperative  Cardiovascular: rrr  Respiratory: no wheezing, moving air well  Abdomen: +BS, soft  Musculoskeletal: no edema  Data Reviewed: Basic Metabolic Panel:  Recent Labs Lab 04/23/14 1800  04/24/14 0711  NA 143 140  K 4.2 4.0  CL 105 103  CO2 25 23  GLUCOSE 96 172*  BUN 20 17  CREATININE 1.10 0.64  CALCIUM 8.6 8.7   Liver Function Tests:  Recent Labs Lab 04/23/14 1800  AST 72*  ALT 31  ALKPHOS 66  BILITOT 0.3  PROT 6.7  ALBUMIN 3.4*   No results found for this basename: LIPASE, AMYLASE,  in the last 168 hours No results found for this basename: AMMONIA,  in the last 168 hours CBC:  Recent Labs Lab 04/23/14 1800 04/24/14 0711  WBC 6.9 6.5  NEUTROABS 4.4  --   HGB 10.3* 10.1*  HCT 31.9* 31.0*  MCV 92.5 89.9  PLT 187 166   Cardiac Enzymes: No results found for this basename: CKTOTAL, CKMB, CKMBINDEX, TROPONINI,  in the last 168 hours BNP (last 3 results)  Recent Labs  07/25/13 1215 09/13/13 1520 04/23/14 1800  PROBNP 499.9* 262.4* 801.7*   CBG: No results found for this basename: GLUCAP,  in the last 168 hours  No results found for this or any previous visit (from the past 240 hour(s)).   Studies: Dg Chest 2 View  04/23/2014   CLINICAL DATA:  Shortness of breath. Cough. History of asthma and hypertension.  EXAM: CHEST  2 VIEW  COMPARISON:  04/22/2014.  FINDINGS: The cardiac silhouette remains borderline enlarged. Bilateral calcified pleural plaques are unchanged. No underlying airspace consolidation. Diffuse osteopenia.  IMPRESSION: No acute abnormality. Stable bilateral calcified pleural plaques compatible with previous asbestos exposure.   Electronically Signed  By: Enrique Sack M.D.   On: 04/23/2014 18:52    Scheduled Meds: . ALPRAZolam  1 mg Oral TID  . amitriptyline  100 mg Oral QHS  . arformoterol  15 mcg Nebulization BID  . atorvastatin  20 mg Oral q1800  . budesonide  0.25 mg Nebulization BID  . dextromethorphan-guaiFENesin  1 tablet Oral BID  . enoxaparin (LOVENOX) injection  40 mg Subcutaneous Q24H  . furosemide  20 mg Oral Daily  . isosorbide mononitrate  30 mg Oral Daily  . levofloxacin  500 mg Oral Daily  . Milnacipran  50  mg Oral BID  . multivitamin with minerals  1 tablet Oral q morning - 10a  . nebivolol  5 mg Oral Daily  . pantoprazole  40 mg Oral BID AC  . pregabalin  300 mg Oral BID  . sodium chloride  3 mL Intravenous Q12H  . sodium chloride  3 mL Intravenous Q12H  . Warfarin - Pharmacist Dosing Inpatient   Does not apply q1800   Continuous Infusions:  Antibiotics Given (last 72 hours)   Date/Time Action Medication Dose   04/24/14 0419 Given   levofloxacin (LEVAQUIN) tablet 500 mg 500 mg      Principal Problem:   Asthma exacerbation Active Problems:   History of iron deficiency anemia   Fibromyalgia syndrome   Pleural plaque with presence of asbestos   Unspecified essential hypertension   OSA (obstructive sleep apnea)   Diastolic CHF   Anemia    Time spent: 35 min    [facility] Pager [phone]. If 7PM-7AM, please contact night-coverage at [email or web address], password [email or web address] 04/25/2014, 8:18 AM  LOS: 2 days

## 2014-04-26 ENCOUNTER — Inpatient Hospital Stay (HOSPITAL_COMMUNITY): Payer: Medicare Other

## 2014-04-26 DIAGNOSIS — I5030 Unspecified diastolic (congestive) heart failure: Secondary | ICD-10-CM | POA: Diagnosis not present

## 2014-04-26 DIAGNOSIS — J9620 Acute and chronic respiratory failure, unspecified whether with hypoxia or hypercapnia: Secondary | ICD-10-CM | POA: Diagnosis not present

## 2014-04-26 DIAGNOSIS — R091 Pleurisy: Secondary | ICD-10-CM | POA: Diagnosis not present

## 2014-04-26 DIAGNOSIS — I509 Heart failure, unspecified: Secondary | ICD-10-CM | POA: Diagnosis not present

## 2014-04-26 DIAGNOSIS — I5033 Acute on chronic diastolic (congestive) heart failure: Secondary | ICD-10-CM | POA: Diagnosis not present

## 2014-04-26 DIAGNOSIS — Z79899 Other long term (current) drug therapy: Secondary | ICD-10-CM

## 2014-04-26 DIAGNOSIS — IMO0001 Reserved for inherently not codable concepts without codable children: Secondary | ICD-10-CM | POA: Diagnosis not present

## 2014-04-26 DIAGNOSIS — R0609 Other forms of dyspnea: Secondary | ICD-10-CM | POA: Diagnosis not present

## 2014-04-26 DIAGNOSIS — J441 Chronic obstructive pulmonary disease with (acute) exacerbation: Secondary | ICD-10-CM | POA: Diagnosis not present

## 2014-04-26 DIAGNOSIS — J45909 Unspecified asthma, uncomplicated: Secondary | ICD-10-CM | POA: Diagnosis not present

## 2014-04-26 DIAGNOSIS — G4700 Insomnia, unspecified: Secondary | ICD-10-CM

## 2014-04-26 DIAGNOSIS — J45901 Unspecified asthma with (acute) exacerbation: Secondary | ICD-10-CM | POA: Diagnosis not present

## 2014-04-26 LAB — PRO B NATRIURETIC PEPTIDE: PRO B NATRI PEPTIDE: 2423 pg/mL — AB (ref 0–125)

## 2014-04-26 LAB — GLUCOSE, CAPILLARY
GLUCOSE-CAPILLARY: 102 mg/dL — AB (ref 70–99)
GLUCOSE-CAPILLARY: 155 mg/dL — AB (ref 70–99)
GLUCOSE-CAPILLARY: 128 mg/dL — AB (ref 70–99)
Glucose-Capillary: 133 mg/dL — ABNORMAL HIGH (ref 70–99)

## 2014-04-26 LAB — PROTIME-INR
INR: 1.42 (ref 0.00–1.49)
Prothrombin Time: 17 seconds — ABNORMAL HIGH (ref 11.6–15.2)

## 2014-04-26 MED ORDER — WARFARIN SODIUM 5 MG PO TABS
5.0000 mg | ORAL_TABLET | Freq: Once | ORAL | Status: AC
  Administered 2014-04-26: 5 mg via ORAL
  Filled 2014-04-26: qty 1

## 2014-04-26 MED ORDER — FUROSEMIDE 40 MG PO TABS
40.0000 mg | ORAL_TABLET | Freq: Every day | ORAL | Status: DC
  Administered 2014-04-27 – 2014-04-28 (×2): 40 mg via ORAL
  Filled 2014-04-26 (×2): qty 1

## 2014-04-26 MED ORDER — FLUTICASONE PROPIONATE 50 MCG/ACT NA SUSP
2.0000 | Freq: Two times a day (BID) | NASAL | Status: DC
  Administered 2014-04-26 – 2014-04-28 (×5): 2 via NASAL
  Filled 2014-04-26: qty 16

## 2014-04-26 MED ORDER — LORATADINE 10 MG PO TABS
10.0000 mg | ORAL_TABLET | Freq: Every day | ORAL | Status: DC
  Administered 2014-04-26 – 2014-04-28 (×3): 10 mg via ORAL
  Filled 2014-04-26 (×4): qty 1

## 2014-04-26 NOTE — Progress Notes (Addendum)
PULMONARY / CRITICAL CARE MEDICINE   Name: Linda [name] MRN: [id number] DOB: 08-Mar-1946    ADMISSION DATE:  04/23/2014 CONSULTATION DATE:  04/24/14   REFERRING MD :  Dr. Arnoldo Morale PRIMARY SERVICE: [facility]  CHIEF COMPLAINT:  Asthma eacerbation   BRIEF PATIENT DESCRIPTION: 68 y/o woman with history of asthma admitted with exacerbation likely related to URI.  SIGNIFICANT EVENTS / STUDIES:    LINES / TUBES: PIV  CULTURES: none  ANTIBIOTICS: Levaquin 5/3>>    SUBJECTIVE:  Feels a little better.  Cough a little better   VITAL SIGNS: Filed Vitals:   04/25/14 2341 04/26/14 0347 04/26/14 0939 04/26/14 0956  BP: 159/63 137/73  123/71  Pulse: 81 84  84  Temp: 98.1 F (36.7 C) 97.8 F (36.6 C)  97.2 F (36.2 C)  TempSrc: Oral Oral  Oral  Resp: 18 18  18   Height:      Weight:      SpO2: 95% 96% 97% 96%  2L [place]    INTAKE / OUTPUT: No intake or output data in the 24 hours ending 04/26/14 1051   PHYSICAL EXAMINATION: General:  Chronically ill appearing female, NAD  Neuro:  A&Ox3 HEENT:  PERRL, EOMI, OP clear + upper airway wheeze  Cardiovascular:  NRRR, no MRG Lungs:  resps even non labored, few scattered wheezes.    Abdomen:  Soft, NTND, +BS Musculoskeletal:  Enlarged PIPs bilaterally, no other joint deformities, no CCE Skin:  No rashes, scattered bruises   PULMONARY No results found for this basename: PHART, PCO2, PCO2ART, PO2, PO2ART, HCO3, TCO2, O2SAT,  in the last 168 hours  CBC  Recent Labs Lab 04/23/14 1800 04/24/14 0711  HGB 10.3* 10.1*  HCT 31.9* 31.0*  WBC 6.9 6.5  PLT 187 166    COAGULATION  Recent Labs Lab 04/23/14 1800 04/24/14 0856 04/25/14 0510 04/26/14 0708  INR 1.00 1.03 1.08 1.42    CARDIAC  No results found for this basename: TROPONINI,  in the last 168 hours  Recent Labs Lab 04/23/14 1800 04/26/14 0708  PROBNP 801.7* 2423.0*     CHEMISTRY  Recent Labs Lab 04/23/14 1800 04/24/14 0711  NA 143 140  K  4.2 4.0  CL 105 103  CO2 25 23  GLUCOSE 96 172*  BUN 20 17  CREATININE 1.10 0.64  CALCIUM 8.6 8.7   Estimated Creatinine Clearance: 66.6 ml/min (by C-G formula based on Cr of 0.64).   LIVER  Recent Labs Lab 04/23/14 1800 04/24/14 0856 04/25/14 0510 04/26/14 0708  AST 72*  --   --   --   ALT 31  --   --   --   ALKPHOS 66  --   --   --   BILITOT 0.3  --   --   --   PROT 6.7  --   --   --   ALBUMIN 3.4*  --   --   --   INR 1.00 1.03 1.08 1.42     INFECTIOUS No results found for this basename: LATICACIDVEN, PROCALCITON,  in the last 168 hours   ENDOCRINE CBG (last 3)   Recent Labs  04/25/14 1705 04/25/14 2053 04/26/14 0743  GLUCAP 169* 104* 102*         IMAGING x48h  Ct Angio Chest Pe W/cm &/or Wo Cm  04/25/2014   CLINICAL DATA:  Shortness of Breath. Sternal chest pain radiating to the right shoulder and jaw. Symptoms for 6 days.  EXAM: CT ANGIOGRAPHY CHEST WITH  CONTRAST  TECHNIQUE: Multidetector CT imaging of the chest was performed using the standard protocol during bolus administration of intravenous contrast. Multiplanar CT image reconstructions and MIPs were obtained to evaluate the vascular anatomy.  CONTRAST:  100 mL OMNIPAQUE IOHEXOL 350 MG/ML SOLN  COMPARISON:  CT chest 02/16/2014.  FINDINGS: No pulmonary embolus is identified. Multiple bilateral calcified pleural plaques are seen as on the prior study. A few small mediastinal and hilar lymph nodes are again seen. 0.8 cm precarinal lymph node on image 39 is unchanged. No new or enlarging lymph nodes are identified. Very small hiatal hernia is noted. Calcific aortic and coronary atherosclerosis is identified. The lungs demonstrate scattered atelectasis and ground-glass attenuation. No consolidative process is seen. Incidentally imaged upper abdomen shows no focal abnormality. No focal bony abnormality is not seen.  Review of the MIP images confirms the above findings.  IMPRESSION: Negative for pulmonary embolus  or acute disease.  Calcified pleural plaques consistent with prior asbestos exposure.  Small hiatal hernia.  Calcific aortic and coronary atherosclerosis.   Electronically Signed   By: Inge Rise M.D.   On: 04/25/2014 15:36      ASSESSMENT / PLAN:   Asthma exacerbation with probably bronchitis (used to be followed in [place]) History of recurrent PE: CT angio negative for PE 5/4, but high d-dimer reflects higher risk for re-occurrence   P:   Cont nebs for now and avoid upper airway irritation from MDI - symbicort at d/c  Max GERD rx  Cont levaquin  Steroids with taper  Add decongestant and nasal steroids Cont anticoagulation  Repeat CT scan as outpt when acute illness recovered to f/u nodules  Anxiety control  outpt pulmonary f/u    Erick Colace, 04/26/2014  10:51 AM   04/26/2014 10:51 AM     STAFF NOTE  she is better. She willl need pulm followup for nodules. Current high d dimer predicts risk for recurrence for clot. She might qualify for MARINER low dose xarelto study or if not./declines we have to decide on continued full dose anti-coagulation v INR > 1.5 coumadin or aspirin. Will reasess 04/27/14  Dr. Brand Males, M.D., [facility].C.P Pulmonary and Critical Care Medicine Staff Physician [place] [facility] Pager: [phone], If no answer or between  15:00h - 7:00h: call [phone]  04/26/2014 11:39 AM

## 2014-04-26 NOTE — Progress Notes (Signed)
ANTICOAGULATION CONSULT NOTE - Follow Up Consult  Pharmacy Consult for Lovenox and Coumadin Indication: hx pulmonary embolus  Allergies  Allergen Reactions  . Penicillins Itching, Swelling and Rash    Tolerated Cefepime in ED.    Patient Measurements: Height: 5\' 4"  (162.6 cm) Weight: 165 lb (74.844 kg) IBW/kg (Calculated) : 54.7  Vital Signs: Temp: 97.2 F (36.2 C) (05/05 0956) Temp src: Oral (05/05 0956) BP: 123/71 mmHg (05/05 0956) Pulse Rate: 84 (05/05 0956)  Labs:  Recent Labs  04/23/14 1800 04/24/14 0711 04/24/14 0856 04/25/14 0510 04/26/14 0708  HGB 10.3* 10.1*  --   --   --   HCT 31.9* 31.0*  --   --   --   PLT 187 166  --   --   --   LABPROT 13.0  --  13.3 13.8 17.0*  INR 1.00  --  1.03 1.08 1.42  CREATININE 1.10 0.64  --   --   --     Estimated Creatinine Clearance: 66.6 ml/min (by C-G formula based on Cr of 0.64).  Assessment:  On Coumadin prior to admission for hx PE in Sept 2014.  INR was 1.0 on admission 5/2.  Home Coumadin dose: 2.5 mg daily, though per outpatient notes, it has been difficult to manage.  Has had Coumadin 5 mg daily x 3 days.  INR remains subtherapeutic (1.42) but now increasing. Lovenox increased to treatment dose 5/4 am.  Day # 3 Levaquin; sometimes effects INR.  IV steroids changed to PO on 5/4. Off home Meloxicam.  CTA negative for PE on 5/4.  Goal of Therapy:  INR 2-3 Heparin level 0.6-1.2 units/ml, 4 hrs after Lovenox dose Monitor platelets by anticoagulation protocol: Yes   Plan:   Coumadin 5 mg PO again tonight; expect lower dose as maintenance dose.  Continue Lovenox 75 mg SQ q12hrs.  Daily PT/INR.  CBC at least every 3 days while on Lovenox.  Levaquin sometimes effects INR; watching.   Arty Baumgartner, [place] Pager: [phone] 04/26/2014,1:41 PM

## 2014-04-26 NOTE — Progress Notes (Signed)
PROGRESS NOTE  Linda [name]:[id number] DOB: 02-08-1946 DOA: 04/23/2014 PCP: Tammi Sou, MD  Assessment/Plan: Asthma Exacerbation- Nebs, add oral prednisone, and [place] O2  -pulm consult: Adherence is the biggest issue and starts with inability to use HFA effectively and also understand that SABA treats the symptoms but doesn't get to the underlying problem (inflammation). She appears to be overusing saba chronically and not on a "controlling" maint med for asthma   pulm nodules -repeat CT chest outpatient  Diastolic CHF- On lasix 20 mg Daily, currently compensated. BNP=801.0   Pleural Plaques form [name]- Stable.   OSA- Remote hx of CPAP usage, Not currently using.   Anemia- Seeing Dr. Marin Olp Hematologist. Ferritin high  Fibromyalgia/ and Chronic Pain- Continue Savella, Lyrica Rx. Discontinue Elavil rx. Meloxicam discontinued while on Steroid Rx.   HTN- On Bystolic Rx, monitor BPs.   Hx of PE since 08/2013, was to take for 6 months, but has been sub-therapeutic so she is still on Coumadin Rx. INR sub therapeutic- add full dose lovenox   Insomnia- Adjust Insomnia Medications. Discontinue Trazodone - Pt said it is not working, Discontinue Restoril- Patient is no longer taking this med. Continue Xanax BID and qhs PRN.   Hyperglycemia -add SSI -on steroids  Recent death of husband   Code Status: full Family Communication: patient Disposition Plan: home 1-2 days   Consultants:  pulm  Procedures:  none    HPI/Subjective: Worried about going home alone -PT eval  Objective: Filed Vitals:   04/26/14 0956  BP: 123/71  Pulse: 84  Temp: 97.2 F (36.2 C)  Resp: 18   No intake or output data in the 24 hours ending 04/26/14 1210 Filed Weights   04/23/14 1729  Weight: 74.844 kg (165 lb)    Exam:   General:  Pleasant/cooperative  Cardiovascular: rrr  Respiratory: no wheezing, moving air well  Abdomen: +BS, soft  Musculoskeletal: no  edema  Data Reviewed: Basic Metabolic Panel:  Recent Labs Lab 04/23/14 1800 04/24/14 0711  NA 143 140  K 4.2 4.0  CL 105 103  CO2 25 23  GLUCOSE 96 172*  BUN 20 17  CREATININE 1.10 0.64  CALCIUM 8.6 8.7   Liver Function Tests:  Recent Labs Lab 04/23/14 1800  AST 72*  ALT 31  ALKPHOS 66  BILITOT 0.3  PROT 6.7  ALBUMIN 3.4*   No results found for this basename: LIPASE, AMYLASE,  in the last 168 hours No results found for this basename: AMMONIA,  in the last 168 hours CBC:  Recent Labs Lab 04/23/14 1800 04/24/14 0711  WBC 6.9 6.5  NEUTROABS 4.4  --   HGB 10.3* 10.1*  HCT 31.9* 31.0*  MCV 92.5 89.9  PLT 187 166   Cardiac Enzymes: No results found for this basename: CKTOTAL, CKMB, CKMBINDEX, TROPONINI,  in the last 168 hours BNP (last 3 results)  Recent Labs  09/13/13 1520 04/23/14 1800 04/26/14 0708  PROBNP 262.4* 801.7* 2423.0*   CBG:  Recent Labs Lab 04/25/14 1140 04/25/14 1705 04/25/14 2053 04/26/14 0743  GLUCAP 124* 169* 104* 102*    No results found for this or any previous visit (from the past 240 hour(s)).   Studies: Ct Angio Chest Pe W/cm &/or Wo Cm  04/25/2014   CLINICAL DATA:  Shortness of Breath. Sternal chest pain radiating to the right shoulder and jaw. Symptoms for 6 days.  EXAM: CT ANGIOGRAPHY CHEST WITH CONTRAST  TECHNIQUE: Multidetector CT imaging of the chest was performed using the standard  protocol during bolus administration of intravenous contrast. Multiplanar CT image reconstructions and MIPs were obtained to evaluate the vascular anatomy.  CONTRAST:  100 mL OMNIPAQUE IOHEXOL 350 MG/ML SOLN  COMPARISON:  CT chest 02/16/2014.  FINDINGS: No pulmonary embolus is identified. Multiple bilateral calcified pleural plaques are seen as on the prior study. A few small mediastinal and hilar lymph nodes are again seen. 0.8 cm precarinal lymph node on image 39 is unchanged. No new or enlarging lymph nodes are identified. Very small hiatal  hernia is noted. Calcific aortic and coronary atherosclerosis is identified. The lungs demonstrate scattered atelectasis and ground-glass attenuation. No consolidative process is seen. Incidentally imaged upper abdomen shows no focal abnormality. No focal bony abnormality is not seen.  Review of the MIP images confirms the above findings.  IMPRESSION: Negative for pulmonary embolus or acute disease.  Calcified pleural plaques consistent with prior asbestos exposure.  Small hiatal hernia.  Calcific aortic and coronary atherosclerosis.   Electronically Signed   By: Inge Rise M.D.   On: 04/25/2014 15:36    Scheduled Meds: . ALPRAZolam  1 mg Oral TID  . amitriptyline  100 mg Oral QHS  . arformoterol  15 mcg Nebulization BID  . atorvastatin  20 mg Oral q1800  . budesonide  0.25 mg Nebulization BID  . dextromethorphan-guaiFENesin  1 tablet Oral BID  . enoxaparin (LOVENOX) injection  75 mg Subcutaneous Q12H  . fluticasone  2 spray Each Nare BID  . furosemide  20 mg Oral Daily  . insulin aspart  0-15 Units Subcutaneous TID WC  . insulin aspart  0-5 Units Subcutaneous QHS  . isosorbide mononitrate  30 mg Oral Daily  . levofloxacin  500 mg Oral Daily  . loratadine  10 mg Oral Daily  . Milnacipran  50 mg Oral BID  . multivitamin with minerals  1 tablet Oral q morning - 10a  . nebivolol  5 mg Oral Daily  . pantoprazole  40 mg Oral BID AC  . predniSONE  40 mg Oral Q breakfast  . pregabalin  300 mg Oral BID  . sodium chloride  3 mL Intravenous Q12H  . sodium chloride  3 mL Intravenous Q12H  . Warfarin - Pharmacist Dosing Inpatient   Does not apply q1800   Continuous Infusions:  Antibiotics Given (last 72 hours)   Date/Time Action Medication Dose   04/24/14 0419 Given   levofloxacin (LEVAQUIN) tablet 500 mg 500 mg   04/25/14 0920 Given   levofloxacin (LEVAQUIN) tablet 500 mg 500 mg   04/26/14 4081 Given   levofloxacin (LEVAQUIN) tablet 500 mg 500 mg      Principal Problem:   Asthma  exacerbation Active Problems:   History of iron deficiency anemia   Fibromyalgia syndrome   Pleural plaque with presence of asbestos   Unspecified essential hypertension   OSA (obstructive sleep apnea)   Diastolic CHF   Anemia    Time spent: 35 min    [facility] Pager [phone]. If 7PM-7AM, please contact night-coverage at [email or web address], password [facility] 04/26/2014, 12:10 PM  LOS: 3 days

## 2014-04-27 ENCOUNTER — Inpatient Hospital Stay (HOSPITAL_COMMUNITY): Payer: Medicare Other

## 2014-04-27 ENCOUNTER — Ambulatory Visit: Payer: Medicare Other | Admitting: Family Medicine

## 2014-04-27 DIAGNOSIS — IMO0001 Reserved for inherently not codable concepts without codable children: Secondary | ICD-10-CM | POA: Diagnosis not present

## 2014-04-27 DIAGNOSIS — G4733 Obstructive sleep apnea (adult) (pediatric): Secondary | ICD-10-CM

## 2014-04-27 DIAGNOSIS — J45909 Unspecified asthma, uncomplicated: Secondary | ICD-10-CM | POA: Diagnosis not present

## 2014-04-27 DIAGNOSIS — I5030 Unspecified diastolic (congestive) heart failure: Secondary | ICD-10-CM | POA: Diagnosis not present

## 2014-04-27 DIAGNOSIS — J45901 Unspecified asthma with (acute) exacerbation: Secondary | ICD-10-CM | POA: Diagnosis not present

## 2014-04-27 DIAGNOSIS — R0602 Shortness of breath: Secondary | ICD-10-CM | POA: Diagnosis not present

## 2014-04-27 DIAGNOSIS — I2699 Other pulmonary embolism without acute cor pulmonale: Secondary | ICD-10-CM

## 2014-04-27 DIAGNOSIS — I059 Rheumatic mitral valve disease, unspecified: Secondary | ICD-10-CM

## 2014-04-27 DIAGNOSIS — I10 Essential (primary) hypertension: Secondary | ICD-10-CM | POA: Diagnosis not present

## 2014-04-27 LAB — CBC
HCT: 34.9 % — ABNORMAL LOW (ref 36.0–46.0)
Hemoglobin: 11.3 g/dL — ABNORMAL LOW (ref 12.0–15.0)
MCH: 29.4 pg (ref 26.0–34.0)
MCHC: 32.4 g/dL (ref 30.0–36.0)
MCV: 90.9 fL (ref 78.0–100.0)
PLATELETS: 227 10*3/uL (ref 150–400)
RBC: 3.84 MIL/uL — ABNORMAL LOW (ref 3.87–5.11)
RDW: 16.9 % — AB (ref 11.5–15.5)
WBC: 9.6 10*3/uL (ref 4.0–10.5)

## 2014-04-27 LAB — BASIC METABOLIC PANEL
BUN: 21 mg/dL (ref 6–23)
CALCIUM: 8.8 mg/dL (ref 8.4–10.5)
CO2: 31 mEq/L (ref 19–32)
CREATININE: 0.74 mg/dL (ref 0.50–1.10)
Chloride: 102 mEq/L (ref 96–112)
GFR, EST NON AFRICAN AMERICAN: 85 mL/min — AB (ref >90)
Glucose, Bld: 138 mg/dL — ABNORMAL HIGH (ref 70–99)
Potassium: 3 mEq/L — ABNORMAL LOW (ref 3.7–5.3)
Sodium: 146 mEq/L (ref 137–147)

## 2014-04-27 LAB — GLUCOSE, CAPILLARY
GLUCOSE-CAPILLARY: 97 mg/dL (ref 70–99)
GLUCOSE-CAPILLARY: 108 mg/dL — AB (ref 70–99)
Glucose-Capillary: 120 mg/dL — ABNORMAL HIGH (ref 70–99)
Glucose-Capillary: 144 mg/dL — ABNORMAL HIGH (ref 70–99)

## 2014-04-27 LAB — PROTIME-INR
INR: 1.69 — ABNORMAL HIGH (ref 0.00–1.49)
Prothrombin Time: 19.4 seconds — ABNORMAL HIGH (ref 11.6–15.2)

## 2014-04-27 MED ORDER — WARFARIN SODIUM 5 MG PO TABS
5.0000 mg | ORAL_TABLET | Freq: Once | ORAL | Status: AC
  Administered 2014-04-27: 5 mg via ORAL
  Filled 2014-04-27: qty 1

## 2014-04-27 MED ORDER — FUROSEMIDE 20 MG PO TABS: 20.0000 mg | ORAL_TABLET | Freq: Every day | ORAL | Status: DC

## 2014-04-27 MED ORDER — OXYCODONE HCL 5 MG PO TABS
5.0000 mg | ORAL_TABLET | ORAL | Status: DC | PRN
  Administered 2014-04-27 – 2014-04-28 (×5): 10 mg via ORAL
  Filled 2014-04-27 (×5): qty 2

## 2014-04-27 MED ORDER — OXYCODONE HCL 5 MG PO TABS: 5.0000 mg | ORAL_TABLET | ORAL | Status: DC | PRN

## 2014-04-27 MED ORDER — PREGABALIN 100 MG PO CAPS: 300.0000 mg | ORAL_CAPSULE | Freq: Two times a day (BID) | ORAL | Status: DC

## 2014-04-27 MED ORDER — ALPRAZOLAM 0.5 MG PO TABS: 1.0000 mg | ORAL_TABLET | Freq: Three times a day (TID) | ORAL | Status: DC | PRN

## 2014-04-27 MED ORDER — POTASSIUM CHLORIDE CRYS ER 20 MEQ PO TBCR
40.0000 meq | EXTENDED_RELEASE_TABLET | Freq: Once | ORAL | Status: AC
  Administered 2014-04-27: 40 meq via ORAL
  Filled 2014-04-27: qty 2

## 2014-04-27 NOTE — Care Management Note (Addendum)
    Page 1 of 1   04/28/2014     4:10:28 PM CARE MANAGEMENT NOTE 04/28/2014  Patient:  [facility]   Account Number:  [id number]  Date Initiated:  04/27/2014  Documentation initiated by:  Tomi Bamberger  Subjective/Objective Assessment:   dx asthma ex  admit- lives alone.     Action/Plan:   pt eval- no pt f/iu.   Anticipated DC Date:  04/28/2014   Anticipated DC Plan:  HOME/SELF CARE      DC Planning Services  CM consult      PAC Choice  DURABLE MEDICAL EQUIPMENT   Choice offered to / List presented to:  C-1 Patient   DME arranged  [facility]      DME agency  [place].        Status of service:  Completed, signed off Medicare Important Message given?  YES (If response is "NO", the following Medicare IM given date fields will be blank) Date Medicare IM given:  04/23/2014 Date Additional Medicare IM given:  04/26/2014  Discharge Disposition:  HOME/SELF CARE  Per UR Regulation:  Reviewed for med. necessity/level of care/duration of stay  If discussed at [place] of Stay Meetings, dates discussed:    Comments:  04/28/14 [place] RN,BSN [phone] patient lives alone, per physical therapy no pt f/u needed. Patient will need home oxygen, she chose [facility], Jermaine notified,brought oxygen up to patient's room.  NCM received benefits check back stating could not pull up any information to just faxe script in to pharmacy to see how much lovernox is.  NCM faxed script to patient's pharmacy, [facility] in [place].  [phone].  phone [phone].  Per pharmacy lovenox is $53.00 for patient.

## 2014-04-27 NOTE — Progress Notes (Addendum)
PULMONARY / CRITICAL CARE MEDICINE   Name: Linda [name] MRN: [id number] DOB: 1946-11-02    ADMISSION DATE:  04/23/2014 CONSULTATION DATE:  04/24/14   REFERRING MD :  Dr. Arnoldo Morale PRIMARY SERVICE: [facility]  CHIEF COMPLAINT:  Asthma eacerbation   BRIEF PATIENT DESCRIPTION:  68 y/o woman with history of asthma admitted with exacerbation likely related to URI. Seen MW in past.  SIGNIFICANT EVENTS / STUDIES:  CTA 5/4 - Negative PE, plaques consistent with asbestos exposure. Small hiatal hernia.   LINES / TUBES: PIV  CULTURES: none  ANTIBIOTICS: Levaquin 5/3>>  SUBJECTIVE:  Feeling slightly better today, cough has improved.   VITAL SIGNS: Filed Vitals:   04/26/14 2358 04/27/14 0401 04/27/14 0730 04/27/14 0800  BP: 148/56 157/80 146/76 145/79  Pulse: 81 85 74 71  Temp: 98.4 F (36.9 C) 98.3 F (36.8 C) 97.6 F (36.4 C) 97.8 F (36.6 C)  TempSrc: Oral Oral Oral Oral  Resp: 16 16 14 16   Height:      Weight:      SpO2: 94% 96% 96% 93%  2L [place]    INTAKE / OUTPUT:  Intake/Output Summary (Last 24 hours) at 04/27/14 1050 Last data filed at 04/27/14 0807  Gross per 24 hour  Intake    240 ml  Output      0 ml  Net    240 ml     PHYSICAL EXAMINATION: General:  Chronically ill appearing female, NAD  Neuro:  A&Ox3 HEENT:  PERRL, EOMI, OP clear + upper airway wheeze  Cardiovascular:  NRRR, no MRG Lungs:  resps even non labored, few scattered wheezes.    Abdomen:  Soft, NTND, +BS Musculoskeletal:  Enlarged PIPs bilaterally, no other joint deformities, no CCE Skin:  No rashes, scattered bruises   PULMONARY No results found for this basename: PHART, PCO2, PCO2ART, PO2, PO2ART, HCO3, TCO2, O2SAT,  in the last 168 hours  CBC  Recent Labs Lab 04/23/14 1800 04/24/14 0711 04/27/14 0355  HGB 10.3* 10.1* 11.3*  HCT 31.9* 31.0* 34.9*  WBC 6.9 6.5 9.6  PLT 187 166 227    COAGULATION  Recent Labs Lab 04/23/14 1800 04/24/14 0856 04/25/14 0510  04/26/14 0708 04/27/14 0355  INR 1.00 1.03 1.08 1.42 1.69*    CARDIAC  No results found for this basename: TROPONINI,  in the last 168 hours  Recent Labs Lab 04/23/14 1800 04/26/14 0708  PROBNP 801.7* 2423.0*     CHEMISTRY  Recent Labs Lab 04/23/14 1800 04/24/14 0711 04/27/14 0355  NA 143 140 146  K 4.2 4.0 3.0*  CL 105 103 102  CO2 25 23 31   GLUCOSE 96 172* 138*  BUN 20 17 21   CREATININE 1.10 0.64 0.74  CALCIUM 8.6 8.7 8.8   Estimated Creatinine Clearance: 66.6 ml/min (by C-G formula based on Cr of 0.74).   LIVER  Recent Labs Lab 04/23/14 1800 04/24/14 0856 04/25/14 0510 04/26/14 0708 04/27/14 0355  AST 72*  --   --   --   --   ALT 31  --   --   --   --   ALKPHOS 66  --   --   --   --   BILITOT 0.3  --   --   --   --   PROT 6.7  --   --   --   --   ALBUMIN 3.4*  --   --   --   --   INR 1.00 1.03 1.08 1.42 1.69*  INFECTIOUS No results found for this basename: LATICACIDVEN, PROCALCITON,  in the last 168 hours   ENDOCRINE CBG (last 3)   Recent Labs  04/26/14 1728 04/26/14 2128 04/27/14 0807  GLUCAP 128* 133* 120*    IMAGING x48h  Ct Angio Chest Pe W/cm &/or Wo Cm  04/25/2014   CLINICAL DATA:  Shortness of Breath. Sternal chest pain radiating to the right shoulder and jaw. Symptoms for 6 days.  EXAM: CT ANGIOGRAPHY CHEST WITH CONTRAST  TECHNIQUE: Multidetector CT imaging of the chest was performed using the standard protocol during bolus administration of intravenous contrast. Multiplanar CT image reconstructions and MIPs were obtained to evaluate the vascular anatomy.  CONTRAST:  100 mL OMNIPAQUE IOHEXOL 350 MG/ML SOLN  COMPARISON:  CT chest 02/16/2014.  FINDINGS: No pulmonary embolus is identified. Multiple bilateral calcified pleural plaques are seen as on the prior study. A few small mediastinal and hilar lymph nodes are again seen. 0.8 cm precarinal lymph node on image 39 is unchanged. No new or enlarging lymph nodes are identified. Very  small hiatal hernia is noted. Calcific aortic and coronary atherosclerosis is identified. The lungs demonstrate scattered atelectasis and ground-glass attenuation. No consolidative process is seen. Incidentally imaged upper abdomen shows no focal abnormality. No focal bony abnormality is not seen.  Review of the MIP images confirms the above findings.  IMPRESSION: Negative for pulmonary embolus or acute disease.  Calcified pleural plaques consistent with prior asbestos exposure.  Small hiatal hernia.  Calcific aortic and coronary atherosclerosis.   Electronically Signed   By: Inge Rise M.D.   On: 04/25/2014 15:36    CXR: Stable appearance of lung fields, questionable new opacity in L CP angle.   ASSESSMENT / PLAN:   Asthma exacerbation with probably bronchitis (used to be followed in [place], [place]) History of recurrent PE: CT angio negative for PE 5/4, but high d-dimer reflects higher risk for re-occurrence   P:   Cont nebs for now and avoid upper airway irritation from MDI - symbicort at d/c  Protonix BID Continue levofloxacin for total of 8 days Steroids with taper over the next week Decongestant and nasal steroids Cont anticoagulation  Repeat CT scan as outpt when acute illness recovered to f/u nodules  Anxiety control  outpt pulmonary f/u upon discharge. Coumadin for anticoag  Georgann Housekeeper, ACNP [facility] Pager [phone] or [phone]  Continue coumadin, abx as above for 8 days and steroid taper over the next week.  F/U with pulmonary as outpatient.  PCCM will sign off, please call back if needed.  Will need a sleep study as outpatient.  Patient seen and examined, agree with above note.  I dictated the care and orders written for this patient under my direction.  Rush Farmer, MD [phone]

## 2014-04-27 NOTE — Progress Notes (Signed)
Echocardiogram 2D Echocardiogram has been performed.  [place] 04/27/2014, 3:35 PM

## 2014-04-27 NOTE — Progress Notes (Signed)
PROGRESS NOTE  BRINNA DIVELBISS KGU:[id number] DOB: Dec 26, 1945 DOA: 04/23/2014 PCP: Tammi Sou, MD  HPI 68 y/o F with extensive medical history notable for COPD, CHF, asbestose placques, asthma, HTN, PE (8/14), Fibromyalgia, etc. She has been having URI s/s for over a week and then developed non productive cough, wheezing, SOB and saw her PCP on 5/1 for an asthma flair. Tx with nebs, steroids, Azithromycin and 5 day follow up. CXR was clear of PNA or edema. Her symptoms worsened and she went to the ED. Again tx with but only minimal resolution, RA SaO2 90% and on O2, raised to 94%. Admitted to [facility] for treatment and monitoring.   Subjective: Pt had just received IV dilaudid and was very sleepy and lethargic but when questions about her condition she began to speak about her husbands recent death, sons medical condition (MS), and her "holding in" her emotions. Pt had no specific complaints at that time.  Patient states she is breathing more easily today.  Assessment/Plan:  Asthma exacerbation Secondary to recent URI, possibly complicated with acute CHF CXR on 5/1: clear, need repeat due to continued SOB CTA-no PE or acute disease Pulm consult: stressed adherance to meds, inc SABA use needs to be moved up a step for better control (add symbicort at D/C) Albuterol, Brovana, Pulmicort, mucinex, oxygen therapy, Levaquin abx day-3 Pulmonology recommends repeat CT scan after acute illness has resolved, sleep study and outpatient pulm follow up..  Acute Diastolic CHF Chronic, possibly acute episode BNP increased [phone] in 4 days with unresolving asthma exacerbation Repeat CXR ordered, Lasix increased, added Klor -Con Recheck 2D echo.  Hx of PE 'small' PE in 8/14 Coumadin therapy subtheraputic, use Lovenox for prophylaxis to raise INR, coumadin per pharmacy Recheck PT/INR, CBC ev 3 days Pulmonology MD to access weather or not she is a candidate for the Golf with  Alen Blew.  Iron deficiency anemia Chronic, controlled Followed by hematology Dr. Marin Olp   Fibromyalgia syndrome Chronic Continue home regimen except Meloxicam due to current steroids, d/c Elavil Continue lyrica  Pleural plaque with presence of asbestos Chronic Repeat CT to monitor once acute illness has resolved Outpatient follow up with Pulmonologist  Hypertension Chronic, controlled Continue Bystolic and Imdur, Hydralazine PRN  Hyperglycemia Elevated BGL, steroid tx contributary SSI PRN    DVT Prophylaxis:  Lovenox-Coumadin  Code Status: Full Family Communication: None at bedside Disposition Plan: Home  Consultants:  Pulmonology  Spiritual care    Antibiotics:  Levoquin 5/3-  Objective: Filed Vitals:   04/26/14 2358 04/27/14 0401 04/27/14 0730 04/27/14 0800  BP: 148/56 157/80 146/76 145/79  Pulse: 81 85 74 71  Temp: 98.4 F (36.9 C) 98.3 F (36.8 C) 97.6 F (36.4 C) 97.8 F (36.6 C)  TempSrc: Oral Oral Oral Oral  Resp: 16 16 14 16   Height:      Weight:      SpO2: 94% 96% 96% 93%    Intake/Output Summary (Last 24 hours) at 04/27/14 1143 Last data filed at 04/27/14 0807  Gross per 24 hour  Intake    240 ml  Output      0 ml  Net    240 ml   Filed Weights   04/23/14 1729  Weight: 74.844 kg (165 lb)    Exam: General: drowsey post med admin, NAD, appears ill  HEENT:  PERR, Anicteic Sclera Neck: Supple, no JVD, no masses  Cardiovascular: RRR, no rubs, murmurs or gallops.   Respiratory: nonlabored breathing with mild wheezes in  the upper lobes and rales in bases  Abdomen: Soft, nontender, nondistended, + bowel sounds  Extremities: warm dry without cyanosis or edema.   Neuro: AAOx3, cranial nerves grossly intact.   Skin: Without rashes exudates or nodules.   Psych: Diminished affect due to medication   Data Reviewed: Basic Metabolic Panel:  Recent Labs Lab 04/23/14 1800 04/24/14 0711 04/27/14 0355  NA 143 140 146  K 4.2 4.0 3.0*   CL 105 103 102  CO2 25 23 31   GLUCOSE 96 172* 138*  BUN 20 17 21   CREATININE 1.10 0.64 0.74  CALCIUM 8.6 8.7 8.8   Liver Function Tests:  Recent Labs Lab 04/23/14 1800  AST 72*  ALT 31  ALKPHOS 66  BILITOT 0.3  PROT 6.7  ALBUMIN 3.4*   CBC:  Recent Labs Lab 04/23/14 1800 04/24/14 0711 04/27/14 0355  WBC 6.9 6.5 9.6  NEUTROABS 4.4  --   --   HGB 10.3* 10.1* 11.3*  HCT 31.9* 31.0* 34.9*  MCV 92.5 89.9 90.9  PLT 187 166 227   BNP (last 3 results)  Recent Labs  09/13/13 1520 04/23/14 1800 04/26/14 0708  PROBNP 262.4* 801.7* 2423.0*   CBG:  Recent Labs Lab 04/26/14 0743 04/26/14 1205 04/26/14 1728 04/26/14 2128 04/27/14 0807  GLUCAP 102* 155* 128* 133* 120*     Studies: Ct Angio Chest Pe W/cm &/or Wo Cm  04/25/2014   CLINICAL DATA:  Shortness of Breath. Sternal chest pain radiating to the right shoulder and jaw. Symptoms for 6 days.  EXAM: CT ANGIOGRAPHY CHEST WITH CONTRAST  TECHNIQUE: Multidetector CT imaging of the chest was performed using the standard protocol during bolus administration of intravenous contrast. Multiplanar CT image reconstructions and MIPs were obtained to evaluate the vascular anatomy.  CONTRAST:  100 mL OMNIPAQUE IOHEXOL 350 MG/ML SOLN  COMPARISON:  CT chest 02/16/2014.  FINDINGS: No pulmonary embolus is identified. Multiple bilateral calcified pleural plaques are seen as on the prior study. A few small mediastinal and hilar lymph nodes are again seen. 0.8 cm precarinal lymph node on image 39 is unchanged. No new or enlarging lymph nodes are identified. Very small hiatal hernia is noted. Calcific aortic and coronary atherosclerosis is identified. The lungs demonstrate scattered atelectasis and ground-glass attenuation. No consolidative process is seen. Incidentally imaged upper abdomen shows no focal abnormality. No focal bony abnormality is not seen.  Review of the MIP images confirms the above findings.  IMPRESSION: Negative for pulmonary  embolus or acute disease.  Calcified pleural plaques consistent with prior asbestos exposure.  Small hiatal hernia.  Calcific aortic and coronary atherosclerosis.   Electronically Signed   By: Inge Rise M.D.   On: 04/25/2014 15:36    Scheduled Meds: . amitriptyline  100 mg Oral QHS  . arformoterol  15 mcg Nebulization BID  . atorvastatin  20 mg Oral q1800  . budesonide  0.25 mg Nebulization BID  . dextromethorphan-guaiFENesin  1 tablet Oral BID  . enoxaparin (LOVENOX) injection  75 mg Subcutaneous Q12H  . fluticasone  2 spray Each Nare BID  . furosemide  40 mg Oral Daily  . insulin aspart  0-15 Units Subcutaneous TID WC  . insulin aspart  0-5 Units Subcutaneous QHS  . isosorbide mononitrate  30 mg Oral Daily  . levofloxacin  500 mg Oral Daily  . loratadine  10 mg Oral Daily  . Milnacipran  50 mg Oral BID  . multivitamin with minerals  1 tablet Oral q morning - 10a  .  nebivolol  5 mg Oral Daily  . pantoprazole  40 mg Oral BID AC  . potassium chloride  40 mEq Oral Once  . predniSONE  40 mg Oral Q breakfast  . pregabalin  300 mg Oral BID  . sodium chloride  3 mL Intravenous Q12H  . sodium chloride  3 mL Intravenous Q12H  . warfarin  5 mg Oral ONCE-1800  . Warfarin - Pharmacist Dosing Inpatient   Does not apply q1800   Continuous Infusions:   Principal Problem:   Asthma exacerbation Active Problems:   History of iron deficiency anemia   Fibromyalgia syndrome   Pleural plaque with presence of asbestos   Unspecified essential hypertension   Diastolic CHF    Raquel Sarna A Johnson PA-S Imogene Burn, PA-C [facility] Pager [phone]. If 7PM-7AM, please contact night-coverage at [email or web address], password [facility] 04/27/2014, 11:43 AM  LOS: 4 days   Attending Patient seen and examined, agree with the above assessment and plan, stop narcotics, ambulate. Suspect home in am.  Nena Alexander MD

## 2014-04-27 NOTE — Evaluation (Signed)
Physical Therapy Evaluation Patient Details Name: Linda [name] MRN: [id number] DOB: 07-Feb-1946 Today's Date: 04/27/2014   History of Present Illness  Admitted with URI leading to asthma exacerbation; PT ordered 5/5  Past Medical History  Diagnosis Date  . HTN (hypertension)   . Depression   . Recurrent UTI     +hx of hospitalization for pyelonephritis  . Hay fever   . Mixed incontinence urge and stress   . Diverticular disease   . Insomnia   . Fibromyalgia     Patient states dx was around her late 23s but she had sx's for years prior to this.  . Syncope     +Hypotensive; ED visit--Dr. Terrence Dupont did Cath--nonobstructive CAD, EF 55-60%  . Idiopathic angio-edema-urticaria 72014    Angioedema component was very minimal  . 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 Asthma     + asbestososis   . History of pneumonia     hospitalized 12/2011, 02/2013, and 07/2013 [facility]) for this  . High cholesterol     "I'm on Lipitor cause it will help my heart in some way" (07/12/2013)  . Anginal pain     Nonobstructive CAD 2014  . Shortness of breath     "all the time recently" (07/12/2013)  . OSA on CPAP   . Iron deficiency anemia       . H/O hiatal hernia   . Migraine syndrome     "not as often anymore; used to be ~ q wk" (07/12/2013)  . Tension headache, chronic   . DDD (degenerative disc disease), lumbar   . Osteoarthritis     "severe; progressing fast" (07/12/2013)  . DDD (degenerative disc disease), cervical   . Chronic lower back pain   . Anxiety   . Panic attacks   . Nephrolithiasis     "passed all on my own or they are still in there" (07/12/2013)  . Pyelonephritis     "several times over the last 30 yr" (07/12/2013)  . CHF (congestive heart failure)     Diastolic  . COPD (chronic obstructive pulmonary disease)   . Pulmonary embolism 07/2013    coumadin needed x 6 mo; Dx at [facility] with very  small peripheral upper lobe pe 07/2013    . Pleural plaque with presence of asbestos 07/22/2013  . BPPV (benign paroxysmal positional vertigo) 12/16/2012     Clinical Impression  Pt admitted with above. Pt currently with functional limitations due to the deficits listed below (see PT Problem List).  Pt will benefit from skilled PT to increase their independence and safety with mobility to allow discharge to the venue listed below.       Follow Up Recommendations No PT follow up    Equipment Recommendations  Cane (if pt does not already have)    Recommendations for Other Services OT consult (for energy conservation)     Precautions / Restrictions Precautions Precautions: Fall Precaution Comments: Fall risk greatly mitigated by using assistive device Restrictions Other Position/Activity Restrictions: Watch O2 sats      Mobility  Bed Mobility Overal bed mobility: Modified Independent                Transfers Overall transfer level: Modified independent Equipment used: None                Ambulation/Gait Ambulation/Gait assistance: Supervision;Min guard Ambulation Distance (Feet): 200  Feet Assistive device:  (UE support pushing dinamap) Gait Pattern/deviations: Decreased stride length     General Gait Details: Overall walked well, especailly when pushing dinamap, as that gave pt some UE support; walked some without UE support and noted loss of balance with turn requiring min assist to regain balance  Stairs            Wheelchair Mobility    Modified Rankin (Stroke Patients Only)       Balance Overall balance assessment: Needs assistance         Standing balance support: No upper extremity supported;Single extremity supported;During functional activity Standing balance-Leahy Scale: Good                               Pertinent Vitals/Pain Session conducted on room air; O2 sats ranged 87% (observed lowest) to 93%; Sats incr to  acceptable levels with seated rest Restarted supplemental O2 1 L end of session for comfort at pt's request    Home Living Family/patient expects to be discharged to:: Private residence Living Arrangements: Alone Available Help at Discharge: Family;Friend(s) (not 24 hour) Type of Home: House Home Access: Stairs to enter Entrance Stairs-Rails: Psychiatric nurse of Steps: 3/8 Home Layout: One level Home Equipment: None      Prior Function Level of Independence: Independent               Hand Dominance        Extremity/Trunk Assessment   Upper Extremity Assessment: Overall WFL for tasks assessed           Lower Extremity Assessment: Overall WFL for tasks assessed         Communication   Communication: No difficulties  Cognition Arousal/Alertness: Awake/alert Behavior During Therapy: WFL for tasks assessed/performed Overall Cognitive Status: Within Functional Limits for tasks assessed                      General Comments      Exercises        Assessment/Plan    PT Assessment Patient needs continued PT services  PT Diagnosis Other (comment);Abnormality of gait (Decr functional capacity)   PT Problem List Decreased activity tolerance;Decreased balance;Decreased knowledge of use of DME;Cardiopulmonary status limiting activity  PT Treatment Interventions DME instruction;Gait training;Stair training;Functional mobility training;Therapeutic activities;Therapeutic exercise;Balance training;Patient/family education   PT Goals (Current goals can be found in the Care Plan section) Acute Rehab PT Goals Patient Stated Goal: breathe better PT Goal Formulation: With patient Time For Goal Achievement: 05/04/14 Potential to Achieve Goals: Good    Frequency Min 3X/week   Barriers to discharge        Co-evaluation               End of Session Equipment Utilized During Treatment:  (Dinamap to monitor O2 sats) Activity Tolerance:  Patient tolerated treatment well Patient left: in chair;with call bell/phone within reach;with family/visitor present;with nursing/sitter in room Nurse Communication: Mobility status         Time: 4196-2229 PT Time Calculation (min): 20 min   Charges:   PT Evaluation $Initial PT Evaluation Tier I: 1 Procedure PT Treatments $Gait Training: 8-22 mins   PT G Codes:          [facility] 04/27/2014, 1:52 PM  Roney Marion, [place] Pager [phone] Office [phone]

## 2014-04-27 NOTE — Progress Notes (Signed)
ANTICOAGULATION CONSULT NOTE - Follow Up Consult  Pharmacy Consult for Lovenox and Coumadin Indication: hx pulmonary embolus  Allergies  Allergen Reactions  . Penicillins Itching, Swelling and Rash    Tolerated Cefepime in ED.    Patient Measurements: Height: 5\' 4"  (162.6 cm) Weight: 165 lb (74.844 kg) IBW/kg (Calculated) : 54.7  Vital Signs: Temp: 97.8 F (36.6 C) (05/06 0800) Temp src: Oral (05/06 0800) BP: 145/79 mmHg (05/06 0800) Pulse Rate: 71 (05/06 0800)  Labs:  Recent Labs  04/25/14 0510 04/26/14 0708 04/27/14 0355  HGB  --   --  11.3*  HCT  --   --  34.9*  PLT  --   --  227  LABPROT 13.8 17.0* 19.4*  INR 1.08 1.42 1.69*  CREATININE  --   --  0.74    Estimated Creatinine Clearance: 66.6 ml/min (by C-G formula based on Cr of 0.74).  Assessment:  On Coumadin prior to admission for hx PE in Sept 2014.  INR was 1.0 on admission 5/2.  Home Coumadin dose: 2.5 mg daily, though per outpatient notes, it has been difficult to manage.  Has had Coumadin 5 mg daily x 4 days.  INR remains subtherapeutic (1.69) but increasing towards goal. On Lovenox treatment dose. CBC stable.  Day # 4 of 8 Levaquin; sometimes effects INR.  Day # 3 Prednisone 40 mg daily. Off home Meloxicam.  CTA negative for PE on 5/4.  Goal of Therapy:  INR 2-3 Heparin level 0.6-1.2 units/ml, 4 hrs after Lovenox dose Monitor platelets by anticoagulation protocol: Yes   Plan:   Coumadin 5 mg PO again tonight; expect lower dose as maintenance dose.  Continue Lovenox 75 mg SQ q12hrs.  Daily PT/INR.  CBC at least every 3 days while on Lovenox.  Levaquin sometimes effects INR; watching.   Arty Baumgartner, [place] Pager: [phone] 04/27/2014,12:30 PM

## 2014-04-28 ENCOUNTER — Other Ambulatory Visit: Payer: Medicare Other | Admitting: Lab

## 2014-04-28 DIAGNOSIS — I5030 Unspecified diastolic (congestive) heart failure: Secondary | ICD-10-CM | POA: Diagnosis not present

## 2014-04-28 DIAGNOSIS — I10 Essential (primary) hypertension: Secondary | ICD-10-CM

## 2014-04-28 DIAGNOSIS — J45901 Unspecified asthma with (acute) exacerbation: Secondary | ICD-10-CM | POA: Diagnosis not present

## 2014-04-28 DIAGNOSIS — F4321 Adjustment disorder with depressed mood: Secondary | ICD-10-CM | POA: Diagnosis present

## 2014-04-28 LAB — BASIC METABOLIC PANEL
BUN: 19 mg/dL (ref 6–23)
CHLORIDE: 100 meq/L (ref 96–112)
CO2: 27 mEq/L (ref 19–32)
CREATININE: 0.73 mg/dL (ref 0.50–1.10)
Calcium: 8.8 mg/dL (ref 8.4–10.5)
GFR calc Af Amer: >90 mL/min (ref >90)
GFR calc non Af Amer: 86 mL/min — ABNORMAL LOW (ref >90)
Glucose, Bld: 104 mg/dL — ABNORMAL HIGH (ref 70–99)
POTASSIUM: 3.5 meq/L — AB (ref 3.7–5.3)
Sodium: 141 mEq/L (ref 137–147)

## 2014-04-28 LAB — PROTIME-INR
INR: 1.55 — AB (ref 0.00–1.49)
Prothrombin Time: 18.2 seconds — ABNORMAL HIGH (ref 11.6–15.2)

## 2014-04-28 LAB — GLUCOSE, CAPILLARY
Glucose-Capillary: 94 mg/dL (ref 70–99)
Glucose-Capillary: 110 mg/dL — ABNORMAL HIGH (ref 70–99)

## 2014-04-28 MED ORDER — FLUTICASONE PROPIONATE 50 MCG/ACT NA SUSP: 2.0000 | Freq: Two times a day (BID) | NASAL | Status: DC

## 2014-04-28 MED ORDER — LORATADINE 10 MG PO TABS: 10.0000 mg | ORAL_TABLET | Freq: Every day | ORAL | Status: DC

## 2014-04-28 MED ORDER — WARFARIN SODIUM 5 MG PO TABS: 5.0000 mg | ORAL_TABLET | Freq: Every day | ORAL | Status: DC

## 2014-04-28 MED ORDER — ENOXAPARIN SODIUM 120 MG/0.8ML ~~LOC~~ SOLN
120.0000 mg | SUBCUTANEOUS | Status: DC
Start: 2014-04-28 — End: 2014-04-28
  Administered 2014-04-28: 120 mg via SUBCUTANEOUS
  Filled 2014-04-28: qty 0.8

## 2014-04-28 MED ORDER — ARFORMOTEROL TARTRATE 15 MCG/2ML IN NEBU: 15.0000 ug | INHALATION_SOLUTION | Freq: Two times a day (BID) | RESPIRATORY_TRACT | Status: DC

## 2014-04-28 MED ORDER — BUDESONIDE-FORMOTEROL FUMARATE 160-4.5 MCG/ACT IN AERO: 2.0000 | INHALATION_SPRAY | Freq: Two times a day (BID) | RESPIRATORY_TRACT | Status: DC

## 2014-04-28 MED ORDER — ENOXAPARIN SODIUM 120 MG/0.8ML ~~LOC~~ SOLN: 120.0000 mg | SUBCUTANEOUS | Status: DC

## 2014-04-28 MED ORDER — PREDNISONE 10 MG PO TABS: 40.0000 mg | ORAL_TABLET | Freq: Every day | ORAL | Status: DC

## 2014-04-28 NOTE — Discharge Summary (Signed)
Physician Discharge Summary  Linda [name] PFX:[id number] DOB: 10-26-1946 DOA: 04/23/2014  PCP: Tammi Sou, MD  Admit date: 04/23/2014 Discharge date: 04/28/2014  Time spent: 60  minutes  Recommendations for Outpatient Follow-up:  1. INR check 5/8 at Dr. Rulon Sera office. 2. Patient will be discharged on oxygen 3. Pulmonology follow up for CT Scan (pulmonary nodules) and Sleep Study) 4. Patient deal with grief over the loss of her husband may benefit from counseling.  Discharge Diagnoses:  Principal Problem:   Asthma exacerbation Active Problems:   History of iron deficiency anemia   Fibromyalgia syndrome   Pleural plaque with presence of asbestos   Unspecified essential hypertension   Diastolic CHF   Grief   Discharge Condition: stable.  Diet recommendation: heart healthy  Filed Weights   04/23/14 1729  Weight: 74.844 kg (165 lb)    History of present illness:  68 y/o F with extensive medical history notable for COPD, CHF, asbestose placques, asthma, HTN, PE (8/14), Fibromyalgia, etc. She has been having URI s/s for over a week and then developed non productive cough, wheezing, SOB and saw her PCP on 5/1 for an asthma flair. Tx with nebs, steroids, Azithromycin and 5 day follow up. CXR was clear of PNA or edema. Her symptoms worsened and she went to the ED. Again tx with but only minimal resolution, RA SaO2 90% and on O2, raised to 94%. Admitted to [facility] for treatment and monitoring.    Hospital Course:   Asthma exacerbation  Secondary to recent URI, possibly complicated with acute CHF  Significant exacerbation in the setting of chronic lung disease requiring pulmonology consultation CTA-no PE or acute disease.  Multiple bilateral calcified plaques seen c/w prior asbestos exposure. Pulm consult: stressed adherance to meds, inc SABA use needs to be moved up a step for better control (add symbicort at D/C)  Patient was treated in the hospital with:  Albuterol, Brovana,  Pulmicort, mucinex, oxygen therapy, Levaquin abx  Pulmonology recommends repeat CT scan after acute illness has resolved, and sleep study as an outpatient. Patient is much improved on D/C. No work of breathing.  No Wheezing.  Acute on chronic hypoxic respiratory failure - Patient has a long-standing history of chronic lung disease from asbestosis, previously was on oxygen. No significantly improved after treatment of acute asthma exacerbation and mild acute diastolic heart failure, patient still hypoxic on ambulation. Her discharge on oxygen, and this can be re-evaluated on followup with PCP in primary pulmonology.  Acute Diastolic CHF  Chronic, possibly mild acute decompensation-but much compensated. BNP increased [phone] in 4 days with unresolving asthma exacerbation  Recheck 2D echo- showed preserved EF with grade 1 diastolic dysfunction.  Hx of PE  'small' PE in 8/14  D-Dimer elevated on admission (1.11), but no PE on CTA. Coumadin therapy subtheraputic,  coumadin dose increased to 5 mg daily, Lovenox bridge at discharge. Please note, patient knows how to inject herself Lovenox, apparently she has done this in the past. INR check on 5/8 scheduled.  Iron deficiency anemia  Chronic, controlled  Followed by hematology Dr. Marin Olp   Fibromyalgia syndrome  Chronic  Continue home regimen except Meloxicam due to current steroids, d/c Elavil  Continue lyrica   Pleural plaque with presence of asbestos  Chronic  Repeat CT to monitor once acute illness has resolved  Outpatient follow up with Pulmonologist   Hypertension  Chronic, controlled  Continue Bystolic and Imdur, Hydralazine PRN   Hyperglycemia  Elevated BGL, steroid tx contributary  Consultations:  Pulmonary  Discharge Exam: Filed Vitals:   04/28/14 1042  BP: 148/71  Pulse:   Temp:   Resp:    General: Awake, alert, immediately tells me that her husband died 2 weeks ago and she doesn't want to go home to an  empty house. Neck: Supple, no JVD, no masses  Cardiovascular: RRR, no rubs, murmurs or gallops.  Respiratory: nonlabored breathing, CTA Abdomen: Soft, nontender, nondistended, + bowel sounds  Extremities: warm dry without cyanosis or edema.  Neuro: AAOx3, cranial nerves grossly intact.  Skin: Without rashes exudates or nodules.  Psych: Patient has been crying.  She is now calm and coherent, appropriate.    Discharge Instructions       Discharge Orders   Future Appointments Provider Department Dept Phone   04/29/2014 11:00 AM [facility] [phone]   05/11/2014 10:30 AM Rachael Fee [facility] [phone]   05/11/2014 11:00 AM Josph Macho, MD [facility] [phone]   05/11/2014 11:45 AM [facility] [phone]   Future Orders Complete By Expires   Diet - low sodium heart healthy  As directed    Increase activity slowly  As directed        Medication List    STOP taking these medications       azithromycin 250 MG tablet  Commonly known as:  ZITHROMAX      TAKE these medications       albuterol 108 (90 BASE) MCG/ACT inhaler  Commonly known as:  PROVENTIL HFA;VENTOLIN HFA  Inhale 2 puffs into the lungs every 6 (six) hours as needed for wheezing or shortness of breath.     albuterol (2.5 MG/3ML) 0.083% nebulizer solution  Commonly known as:  PROVENTIL  Take 3 mLs (2.5 mg total) by nebulization every 6 (six) hours as needed for wheezing or shortness of breath.     ALPRAZolam 1 MG tablet  Commonly known as:  XANAX  1/2-1 tab po tid as needed for anxiety or sleep     amitriptyline 100 MG tablet  Commonly known as:  ELAVIL  Take 100 mg by mouth at bedtime.     arformoterol 15 MCG/2ML Nebu  Commonly known as:  BROVANA  Take 2 mLs (15 mcg total) by nebulization 2 (two) times daily.     atorvastatin 20 MG tablet   Commonly known as:  LIPITOR  Take 1 tablet (20 mg total) by mouth daily at 6 PM.     budesonide-formoterol 160-4.5 MCG/ACT inhaler  Commonly known as:  SYMBICORT  Inhale 2 puffs into the lungs 2 (two) times daily.     CENTRUM SILVER tablet  Take 1 tablet by mouth.     enoxaparin 120 MG/0.8ML injection  Commonly known as:  LOVENOX  Inject 0.8 mLs (120 mg total) into the skin daily.     fluticasone 50 MCG/ACT nasal spray  Commonly known as:  FLONASE  Place 2 sprays into both nostrils 2 (two) times daily.     furosemide 20 MG tablet  Commonly known as:  LASIX  Take 20 mg by mouth daily as needed for fluid.     isosorbide mononitrate 30 MG 24 hr tablet  Commonly known as:  IMDUR  Take 1 tablet (30 mg total) by mouth daily.     loratadine 10 MG tablet  Commonly known as:  CLARITIN  Take 1  tablet (10 mg total) by mouth daily.     meloxicam 7.5 MG tablet  Commonly known as:  MOBIC  Take 7.5 mg by mouth 2 (two) times daily.     Milnacipran 50 MG Tabs tablet  Commonly known as:  SAVELLA  Take 1 tablet (50 mg total) by mouth 2 (two) times daily.     nebivolol 5 MG tablet  Commonly known as:  BYSTOLIC  Take 1 tablet (5 mg total) by mouth daily.     nitroGLYCERIN 0.4 MG SL tablet  Commonly known as:  NITROSTAT  Place 1 tablet (0.4 mg total) under the tongue every 5 (five) minutes x 3 doses as needed for chest pain.     predniSONE 10 MG tablet  Commonly known as:  DELTASONE  Take 4 tablets (40 mg total) by mouth daily with breakfast. Taper take 40 mg for 2 days, then 30 mg for 2 days, then 20 mg for 2 days, then 10 mg for 2 days then stop.     pregabalin 300 MG capsule  Commonly known as:  LYRICA  Take 300 mg by mouth 2 (two) times daily.     traZODone 50 MG tablet  Commonly known as:  DESYREL  Take 2-4 tabs po qhs for insomnia     warfarin 5 MG tablet  Commonly known as:  COUMADIN  Take 1 tablet (5 mg total) by mouth daily.       Allergies  Allergen Reactions   . Penicillins Itching, Swelling and Rash    Tolerated Cefepime in ED.   Follow-up Information   Follow up with Tammi Sou, MD On 04/29/2014. (INR check at 11:00 lab appointment)    Specialty:  Family Medicine   Contact information:   [address] [place] [address] [phone]       Follow up with Christinia Gully, MD In 2 weeks.   Specialty:  Pulmonary Disease   Contact information:   [address] [place] [address] [phone]        The results of significant diagnostics from this hospitalization (including imaging, microbiology, ancillary and laboratory) are listed below for reference.    Significant Diagnostic Studies: Dg Chest 2 View  04/27/2014   CLINICAL DATA:  Shortness of breath, edema  EXAM: CHEST  2 VIEW  COMPARISON:  04/25/2014  FINDINGS: Cardiomediastinal silhouette is stable. No acute infiltrate or pulmonary edema. Calcified bilateral pleural plaques are again noted.  IMPRESSION: No active disease.  Stable bilateral calcified pleural plaques.   Electronically Signed   By: Lahoma Crocker M.D.   On: 04/27/2014 11:51   Dg Chest 2 View  04/23/2014   CLINICAL DATA:  Shortness of breath. Cough. History of asthma and hypertension.  EXAM: CHEST  2 VIEW  COMPARISON:  04/22/2014.  FINDINGS: The cardiac silhouette remains borderline enlarged. Bilateral calcified pleural plaques are unchanged. No underlying airspace consolidation. Diffuse osteopenia.  IMPRESSION: No acute abnormality. Stable bilateral calcified pleural plaques compatible with previous asbestos exposure.   Electronically Signed   By: Enrique Sack M.D.   On: 04/23/2014 18:52   Dg Chest 2 View  04/22/2014   CLINICAL DATA:  Cough, shortness of breath, asthmatic bronchitis, basilar crackles, wheeze  EXAM: CHEST  2 VIEW  COMPARISON:  CT CHEST W/O CM dated 02/16/2014; DG CHEST 2 VIEW dated 02/11/2014; DG CHEST 1V PORT dated 09/13/2013  FINDINGS: Grossly unchanged borderline enlarged cardiac silhouette and  mediastinal contours with atherosclerotic plaque within a mildly tortuous  thoracic aorta. Extensive bilateral pleural calcifications are grossly unchanged though again degrade / obscure evaluation of the underlying pulmonary parenchyma. No discrete focal airspace opacities. No pleural effusion or pneumothorax. No definite evidence of edema. No acute osseus abnormalities.  IMPRESSION: Sequela of prior asbestos exposure without definite superimposed acute cardiopulmonary disease.   Electronically Signed   By: Sandi Mariscal M.D.   On: 04/22/2014 15:24   Ct Angio Chest Pe W/cm &/or Wo Cm  04/25/2014   CLINICAL DATA:  Shortness of Breath. Sternal chest pain radiating to the right shoulder and jaw. Symptoms for 6 days.  EXAM: CT ANGIOGRAPHY CHEST WITH CONTRAST  TECHNIQUE: Multidetector CT imaging of the chest was performed using the standard protocol during bolus administration of intravenous contrast. Multiplanar CT image reconstructions and MIPs were obtained to evaluate the vascular anatomy.  CONTRAST:  100 mL OMNIPAQUE IOHEXOL 350 MG/ML SOLN  COMPARISON:  CT chest 02/16/2014.  FINDINGS: No pulmonary embolus is identified. Multiple bilateral calcified pleural plaques are seen as on the prior study. A few small mediastinal and hilar lymph nodes are again seen. 0.8 cm precarinal lymph node on image 39 is unchanged. No new or enlarging lymph nodes are identified. Very small hiatal hernia is noted. Calcific aortic and coronary atherosclerosis is identified. The lungs demonstrate scattered atelectasis and ground-glass attenuation. No consolidative process is seen. Incidentally imaged upper abdomen shows no focal abnormality. No focal bony abnormality is not seen.  Review of the MIP images confirms the above findings.  IMPRESSION: Negative for pulmonary embolus or acute disease.  Calcified pleural plaques consistent with prior asbestos exposure.  Small hiatal hernia.  Calcific aortic and coronary atherosclerosis.    Electronically Signed   By: Inge Rise M.D.   On: 04/25/2014 15:36     Labs: Basic Metabolic Panel:  Recent Labs Lab 04/23/14 1800 04/24/14 0711 04/27/14 0355 04/28/14 0709  NA 143 140 146 141  K 4.2 4.0 3.0* 3.5*  CL 105 103 102 100  CO2 25 23 31 27   GLUCOSE 96 172* 138* 104*  BUN 20 17 21 19   CREATININE 1.10 0.64 0.74 0.73  CALCIUM 8.6 8.7 8.8 8.8   Liver Function Tests:  Recent Labs Lab 04/23/14 1800  AST 72*  ALT 31  ALKPHOS 66  BILITOT 0.3  PROT 6.7  ALBUMIN 3.4*   CBC:  Recent Labs Lab 04/23/14 1800 04/24/14 0711 04/27/14 0355  WBC 6.9 6.5 9.6  NEUTROABS 4.4  --   --   HGB 10.3* 10.1* 11.3*  HCT 31.9* 31.0* 34.9*  MCV 92.5 89.9 90.9  PLT 187 166 227   BNP: BNP (last 3 results)  Recent Labs  09/13/13 1520 04/23/14 1800 04/26/14 0708  PROBNP 262.4* 801.7* 2423.0*   CBG:  Recent Labs Lab 04/27/14 0807 04/27/14 1148 04/27/14 1714 04/27/14 2131 04/28/14 0802  GLUCAP 120* 144* 97 108* 94       Signed:  Karen Kitchens [phone]  [facility] 04/28/2014, 12:01 PM  Attending - Patient seen and examined, agree with the above assessment and plan. Significantly improved, no rhonchi on exam, has inspiratory rales-suspect this is chronic. INR remains subtherapeutic, we'll discharge on overlapping Coumadin and Lovenox-note-patient has used Lovenox in the past. Rest as above, stable for discharge.  Nena Alexander MD

## 2014-04-28 NOTE — Progress Notes (Signed)
Patient discharge teaching given, including activity, diet, follow-up appoints, and medications. Patient verbalized understanding of all discharge instructions. IV access was d/c'd. Vitals are stable. Skin is intact except as charted in most recent assessments. Pt to be escorted out by NT, to be driven home by family. 

## 2014-04-28 NOTE — Discharge Instructions (Signed)
·   You will be discharged on lovenox as you INR (coumadin level) is not therapeutic.    Please follow up with Dr. Ernestine Conrad for your INR and Hospital follow up.    You will need a pulmonology follow up for Sleep Study and a Chest CT.

## 2014-04-28 NOTE — Progress Notes (Signed)
SATURATION QUALIFICATIONS: (This note is used to comply with regulatory documentation for home oxygen)  Patient Saturations on Room Air at Rest = 91%  Patient Saturations on Room Air while Ambulating =87%  Patient Saturations on 2 Liters of oxygen while Ambulating = 92%  Please briefly explain why patient needs home oxygen: 

## 2014-04-29 ENCOUNTER — Other Ambulatory Visit: Payer: Medicare Other

## 2014-05-02 ENCOUNTER — Other Ambulatory Visit: Payer: Self-pay | Admitting: Family Medicine

## 2014-05-02 ENCOUNTER — Ambulatory Visit: Payer: Medicare Other | Admitting: Family Medicine

## 2014-05-02 ENCOUNTER — Telehealth: Payer: Self-pay | Admitting: Family Medicine

## 2014-05-02 DIAGNOSIS — J45909 Unspecified asthma, uncomplicated: Secondary | ICD-10-CM

## 2014-05-02 NOTE — Telephone Encounter (Signed)
Patient is feeling better so she is only going to have labs drawn to check her INR

## 2014-05-02 NOTE — Telephone Encounter (Signed)
Please advise 

## 2014-05-02 NOTE — Telephone Encounter (Signed)
Patient was in the hospital last week, she has scheduled a hospital follow up for today but states she only needs to have her INR checked and she would like an order for a nebulizer sent to [facility], does she need to be seen to have this done? If not patient would like to cancel appt and only have her INR checked

## 2014-05-02 NOTE — Telephone Encounter (Signed)
As long as she is feeling much improved then she can do lab visit instead of o/v: check INR. Pls order neb machine.-thx

## 2014-05-03 ENCOUNTER — Other Ambulatory Visit (INDEPENDENT_AMBULATORY_CARE_PROVIDER_SITE_OTHER): Payer: Medicare Other

## 2014-05-03 DIAGNOSIS — I2699 Other pulmonary embolism without acute cor pulmonale: Secondary | ICD-10-CM | POA: Diagnosis not present

## 2014-05-03 DIAGNOSIS — D509 Iron deficiency anemia, unspecified: Secondary | ICD-10-CM

## 2014-05-04 ENCOUNTER — Ambulatory Visit: Payer: Medicare Other | Admitting: Family Medicine

## 2014-05-04 DIAGNOSIS — M2550 Pain in unspecified joint: Secondary | ICD-10-CM | POA: Diagnosis not present

## 2014-05-04 DIAGNOSIS — IMO0001 Reserved for inherently not codable concepts without codable children: Secondary | ICD-10-CM | POA: Diagnosis not present

## 2014-05-04 DIAGNOSIS — M5137 Other intervertebral disc degeneration, lumbosacral region: Secondary | ICD-10-CM | POA: Diagnosis not present

## 2014-05-04 DIAGNOSIS — M1990 Unspecified osteoarthritis, unspecified site: Secondary | ICD-10-CM | POA: Diagnosis not present

## 2014-05-04 LAB — PROTIME-INR
INR: 1.96 — ABNORMAL HIGH (ref <1.50)
Prothrombin Time: 21.9 seconds — ABNORMAL HIGH (ref 11.6–15.2)

## 2014-05-04 NOTE — Telephone Encounter (Signed)
Continue... She only has one dose of Lovenox left. Please send in Rx.

## 2014-05-04 NOTE — Telephone Encounter (Signed)
Patient called to get lab results & she also only has one dose of Lov

## 2014-05-05 LAB — PULMONARY FUNCTION TEST
FEF 25-75 Pre: 1.27 L/sec
FEF2575-%Pred-Pre: 63 %
FEV1-%Pred-Pre: 45 %
FEV1-Pre: 1.07 L
FEV1FVC-%Pred-Pre: 111 %
FEV6-%PRED-PRE: 42 %
FEV6-Pre: 1.25 L
FEV6FVC-%PRED-PRE: 104 %
FVC-%Pred-Pre: 41 %
FVC-Pre: 1.26 L
PRE FEV1/FVC RATIO: 85 %
PRE FEV6/FVC RATIO: 100 %

## 2014-05-09 ENCOUNTER — Other Ambulatory Visit: Payer: Self-pay | Admitting: Family Medicine

## 2014-05-09 DIAGNOSIS — Z79899 Other long term (current) drug therapy: Secondary | ICD-10-CM | POA: Diagnosis not present

## 2014-05-09 DIAGNOSIS — IMO0001 Reserved for inherently not codable concepts without codable children: Secondary | ICD-10-CM | POA: Diagnosis not present

## 2014-05-09 DIAGNOSIS — M5137 Other intervertebral disc degeneration, lumbosacral region: Secondary | ICD-10-CM | POA: Diagnosis not present

## 2014-05-09 DIAGNOSIS — G894 Chronic pain syndrome: Secondary | ICD-10-CM | POA: Diagnosis not present

## 2014-05-10 ENCOUNTER — Other Ambulatory Visit: Payer: Self-pay | Admitting: Nurse Practitioner

## 2014-05-10 ENCOUNTER — Other Ambulatory Visit: Payer: Self-pay | Admitting: *Deleted

## 2014-05-10 ENCOUNTER — Other Ambulatory Visit (INDEPENDENT_AMBULATORY_CARE_PROVIDER_SITE_OTHER): Payer: Medicare Other

## 2014-05-10 DIAGNOSIS — I2699 Other pulmonary embolism without acute cor pulmonale: Secondary | ICD-10-CM | POA: Diagnosis not present

## 2014-05-10 DIAGNOSIS — D509 Iron deficiency anemia, unspecified: Secondary | ICD-10-CM

## 2014-05-10 LAB — PROTIME-INR
INR: 3.2 ratio — ABNORMAL HIGH (ref 0.8–1.0)
Prothrombin Time: 34.3 s — ABNORMAL HIGH (ref 9.6–13.1)

## 2014-05-11 ENCOUNTER — Ambulatory Visit (HOSPITAL_BASED_OUTPATIENT_CLINIC_OR_DEPARTMENT_OTHER): Payer: Medicare Other | Admitting: Hematology & Oncology

## 2014-05-11 ENCOUNTER — Encounter: Payer: Self-pay | Admitting: Hematology & Oncology

## 2014-05-11 ENCOUNTER — Other Ambulatory Visit (HOSPITAL_BASED_OUTPATIENT_CLINIC_OR_DEPARTMENT_OTHER): Payer: Medicare Other | Admitting: Lab

## 2014-05-11 ENCOUNTER — Ambulatory Visit (HOSPITAL_BASED_OUTPATIENT_CLINIC_OR_DEPARTMENT_OTHER): Payer: Medicare Other

## 2014-05-11 VITALS — BP 123/74 | HR 77 | Temp 97.8°F | Resp 14 | Ht 64.0 in | Wt 173.0 lb

## 2014-05-11 DIAGNOSIS — M797 Fibromyalgia: Secondary | ICD-10-CM | POA: Insufficient documentation

## 2014-05-11 DIAGNOSIS — I2699 Other pulmonary embolism without acute cor pulmonale: Secondary | ICD-10-CM | POA: Diagnosis not present

## 2014-05-11 DIAGNOSIS — J45909 Unspecified asthma, uncomplicated: Secondary | ICD-10-CM | POA: Insufficient documentation

## 2014-05-11 DIAGNOSIS — D509 Iron deficiency anemia, unspecified: Secondary | ICD-10-CM | POA: Diagnosis not present

## 2014-05-11 DIAGNOSIS — J61 Pneumoconiosis due to asbestos and other mineral fibers: Secondary | ICD-10-CM | POA: Insufficient documentation

## 2014-05-11 DIAGNOSIS — I509 Heart failure, unspecified: Secondary | ICD-10-CM | POA: Insufficient documentation

## 2014-05-11 DIAGNOSIS — I10 Essential (primary) hypertension: Secondary | ICD-10-CM | POA: Insufficient documentation

## 2014-05-11 DIAGNOSIS — IMO0002 Reserved for concepts with insufficient information to code with codable children: Secondary | ICD-10-CM | POA: Insufficient documentation

## 2014-05-11 DIAGNOSIS — J449 Chronic obstructive pulmonary disease, unspecified: Secondary | ICD-10-CM | POA: Insufficient documentation

## 2014-05-11 DIAGNOSIS — M1990 Unspecified osteoarthritis, unspecified site: Secondary | ICD-10-CM | POA: Insufficient documentation

## 2014-05-11 LAB — CBC WITH DIFFERENTIAL (CANCER CENTER ONLY)
BASO#: 0 10*3/uL (ref 0.0–0.2)
BASO%: 0.4 % (ref 0.0–2.0)
EOS%: 0.7 % (ref 0.0–7.0)
Eosinophils Absolute: 0.1 10*3/uL (ref 0.0–0.5)
HCT: 33.2 % — ABNORMAL LOW (ref 34.8–46.6)
HGB: 10.8 g/dL — ABNORMAL LOW (ref 11.6–15.9)
LYMPH#: 1.8 10*3/uL (ref 0.9–3.3)
LYMPH%: 26.2 % (ref 14.0–48.0)
MCH: 30.6 pg (ref 26.0–34.0)
MCHC: 32.5 g/dL (ref 32.0–36.0)
MCV: 94 fL (ref 81–101)
MONO#: 0.6 10*3/uL (ref 0.1–0.9)
MONO%: 9.6 % (ref 0.0–13.0)
NEUT#: 4.2 10*3/uL (ref 1.5–6.5)
NEUT%: 63.1 % (ref 39.6–80.0)
PLATELETS: 232 10*3/uL (ref 145–400)
RBC: 3.53 10*6/uL — ABNORMAL LOW (ref 3.70–5.32)
RDW: 15.9 % — ABNORMAL HIGH (ref 11.1–15.7)
WBC: 6.7 10*3/uL (ref 3.9–10.0)

## 2014-05-11 LAB — RETICULOCYTES (CHCC)
ABS RETIC: 79.4 10*3/uL (ref 19.0–186.0)
RBC.: 3.61 MIL/uL — ABNORMAL LOW (ref 3.87–5.11)
Retic Ct Pct: 2.2 % (ref 0.4–2.3)

## 2014-05-11 LAB — IRON AND TIBC CHCC
%SAT: 27 % (ref 21–57)
Iron: 80 ug/dL (ref 41–142)
TIBC: 290 ug/dL (ref 236–444)
UIBC: 211 ug/dL (ref 120–384)

## 2014-05-11 LAB — FERRITIN CHCC

## 2014-05-11 MED ORDER — FAMOTIDINE IN NACL 20-0.9 MG/50ML-% IV SOLN
40.0000 mg | Freq: Two times a day (BID) | INTRAVENOUS | Status: DC
  Administered 2014-05-11: 40 mg via INTRAVENOUS

## 2014-05-11 MED ORDER — SODIUM CHLORIDE 0.9 % IV SOLN
1020.0000 mg | Freq: Once | INTRAVENOUS | Status: AC
  Administered 2014-05-11: 1020 mg via INTRAVENOUS
  Filled 2014-05-11: qty 34

## 2014-05-11 MED ORDER — METHYLPREDNISOLONE SODIUM SUCC 125 MG IJ SOLR
INTRAMUSCULAR | Status: AC
  Filled 2014-05-11: qty 2

## 2014-05-11 MED ORDER — METHYLPREDNISOLONE SODIUM SUCC 125 MG IJ SOLR
125.0000 mg | Freq: Once | INTRAMUSCULAR | Status: AC
  Administered 2014-05-11: 125 mg via INTRAVENOUS

## 2014-05-11 MED ORDER — DIPHENHYDRAMINE HCL 50 MG/ML IJ SOLN
25.0000 mg | Freq: Once | INTRAMUSCULAR | Status: AC
  Administered 2014-05-11: 25 mg via INTRAVENOUS

## 2014-05-11 MED ORDER — DIPHENHYDRAMINE HCL 50 MG/ML IJ SOLN
INTRAMUSCULAR | Status: AC
  Filled 2014-05-11: qty 1

## 2014-05-11 MED ORDER — CYANOCOBALAMIN 1000 MCG/ML IJ SOLN
INTRAMUSCULAR | Status: AC
  Filled 2014-05-11: qty 1

## 2014-05-11 MED ORDER — FAMOTIDINE IN NACL 20-0.9 MG/50ML-% IV SOLN
INTRAVENOUS | Status: AC
  Filled 2014-05-11: qty 100

## 2014-05-11 MED ORDER — SODIUM CHLORIDE 0.9 % IV SOLN
Freq: Once | INTRAVENOUS | Status: AC
  Administered 2014-05-11: 13:00:00 via INTRAVENOUS

## 2014-05-11 MED ORDER — CYANOCOBALAMIN 1000 MCG/ML IJ SOLN
1000.0000 ug | Freq: Once | INTRAMUSCULAR | Status: AC
  Administered 2014-05-11: 1000 ug via INTRAMUSCULAR

## 2014-05-11 MED ORDER — CYANOCOBALAMIN 1000 MCG/ML IJ SOLN
INTRAMUSCULAR | Status: AC
Start: 2014-05-11 — End: 2014-05-11
  Filled 2014-05-11: qty 1

## 2014-05-11 NOTE — Patient Instructions (Signed)

## 2014-05-11 NOTE — Progress Notes (Signed)
ematology and Oncology Follow Up Visit  Linda [name] [id number] [birth year] 68 y.o. 05/11/2014   Principle Diagnosis:  Idiopathic pulmonary embolism. 2. Recurrent iron-deficiency anemia. 3. Chronic congestive heart failure. 4. [name]. 5.  Pernicious anemia  Current Therapy:   IV iron as indicated. 2. Coumadin-2-year duration. 3. Vit B12 1mg  IM q month     Interim History:  Ms.  [name] is back for followup. Not surprising, she was hospitalized. Bronchitis. She got through this. She just has a lot of health issues.  She feels tired. She was still tired. She's had no bleeding. She is on Coumadin.  She's had no nausea. Patient had a lot of aches and pains. She has a lot of arthritic issues.  Her labs with her iron is a ferritin of 1300. However, her iron saturation is only 27%. Loud 8 ferritin is from an acute phase issue.    Medications: Current outpatient prescriptions:albuterol (PROVENTIL HFA;VENTOLIN HFA) 108 (90 BASE) MCG/ACT inhaler, Inhale 2 puffs into the lungs every 6 (six) hours as needed for wheezing or shortness of breath. , Disp: , Rfl: ;  albuterol (PROVENTIL) (2.5 MG/3ML) 0.083% nebulizer solution, Take 3 mLs (2.5 mg total) by nebulization every 6 (six) hours as needed for wheezing or shortness of breath., Disp: 75 mL, Rfl: 1 ALPRAZolam (XANAX) 1 MG tablet, 1/2-1 tab po tid as needed for anxiety or sleep, Disp: 90 tablet, Rfl: 5;  amitriptyline (ELAVIL) 100 MG tablet, TAKE 1 TABLET (100 MG TOTAL) BY MOUTH AT BEDTIME, Disp: 30 tablet, Rfl: 6;  arformoterol (BROVANA) 15 MCG/2ML NEBU, Take 2 mLs (15 mcg total) by nebulization 2 (two) times daily., Disp: 120 mL, Rfl: 3 atorvastatin (LIPITOR) 20 MG tablet, Take 1 tablet (20 mg total) by mouth daily at 6 PM., Disp: 30 tablet, Rfl: 6;  fluticasone (FLONASE) 50 MCG/ACT nasal spray, Place 2 sprays into both nostrils 2 (two) times daily., Disp: 16 g, Rfl: 2;  furosemide (LASIX) 20 MG tablet, TAKE 1 TABLET BY MOUTH EVERY DAY,  Disp: 30 tablet, Rfl: 3;  isosorbide mononitrate (IMDUR) 30 MG 24 hr tablet, Take 1 tablet (30 mg total) by mouth daily., Disp: 30 tablet, Rfl: 3 loratadine (CLARITIN) 10 MG tablet, Take 1 tablet (10 mg total) by mouth daily., Disp: , Rfl: ;  meloxicam (MOBIC) 7.5 MG tablet, Take 7.5 mg by mouth 2 (two) times daily. , Disp: , Rfl: ;  Milnacipran (SAVELLA) 50 MG TABS tablet, Take 1 tablet (50 mg total) by mouth 2 (two) times daily., Disp: 60 tablet, Rfl: 3;  Multiple Vitamins-Minerals (CENTRUM SILVER) tablet, Take 1 tablet by mouth., Disp: , Rfl:  nebivolol (BYSTOLIC) 5 MG tablet, Take 1 tablet (5 mg total) by mouth daily., Disp: 30 tablet, Rfl: 2;  nitroGLYCERIN (NITROSTAT) 0.4 MG SL tablet, Place 1 tablet (0.4 mg total) under the tongue every 5 (five) minutes x 3 doses as needed for chest pain., Disp: 60 tablet, Rfl: 3;  pregabalin (LYRICA) 300 MG capsule, Take 300 mg by mouth 2 (two) times daily., Disp: , Rfl:  traZODone (DESYREL) 50 MG tablet, Take 2-4 tabs po qhs for insomnia, Disp: 120 tablet, Rfl: 6;  warfarin (COUMADIN) 5 MG tablet, Take 5 mg by mouth daily. AS DIRECTED, Disp: , Rfl:  No current facility-administered medications for this visit. Facility-Administered Medications Ordered in Other Visits: famotidine (PEPCID) IVPB 40 mg, 40 mg, Intravenous, Q12H, Volanda Napoleon, MD, 40 mg at 05/11/14 1328  Allergies:  Allergies  Allergen Reactions  . Penicillins Itching, Swelling and  Rash    Tolerated Cefepime in ED.    Past Medical History, Surgical history, Social history, and Family History were reviewed and updated.  Review of Systems: As above  Physical Exam:  height is 5\' 4"  (1.626 m) and weight is 173 lb (78.472 kg). Her oral temperature is 97.8 F (36.6 C). Her blood pressure is 123/74 and her pulse is 77. Her respiration is 14 and oxygen saturation is 99%.   Decreased breath sounds throughout all lung fields. Cardiac exam regular in rhythm. Abdomen soft. There is no liver or  spleen. Exam no tenderness. Extremities shows some 1+ edema. Joints are arthritic. Skin exam no rashes.  Lab Results  Component Value Date   WBC 6.7 05/11/2014   HGB 10.8* 05/11/2014   HCT 33.2* 05/11/2014   MCV 94 05/11/2014   PLT 232 05/11/2014     Chemistry      Component Value Date/Time   NA 141 04/28/2014 0709   K 3.5* 04/28/2014 0709   CL 100 04/28/2014 0709   CO2 27 04/28/2014 0709   BUN 19 04/28/2014 0709   CREATININE 0.73 04/28/2014 0709   CREATININE 0.65 05/27/2013 1706      Component Value Date/Time   CALCIUM 8.8 04/28/2014 0709   ALKPHOS 66 04/23/2014 1800   AST 72* 04/23/2014 1800   ALT 31 04/23/2014 1800   BILITOT 0.3 04/23/2014 1800         Impression and Plan: Linda [name] is 68 year old female. We will go ahead and give her iron today. We'll also order a B12.  Again, her ferritin is an acute phase reactant.  Her reticulocyte count is quite low. This, tell me that we had to give her iron.  She does have a low erythropoietin level. I don't thing we have to give her an Aranesp today.  We will plan to get her back in another 4 weeks. I suspect we will have to give her Aranesp then.   Volanda Napoleon, MD 5/20/20157:08 PM

## 2014-05-12 ENCOUNTER — Other Ambulatory Visit: Payer: Self-pay | Admitting: Family Medicine

## 2014-05-13 ENCOUNTER — Other Ambulatory Visit: Payer: Self-pay | Admitting: Family Medicine

## 2014-05-17 ENCOUNTER — Other Ambulatory Visit (INDEPENDENT_AMBULATORY_CARE_PROVIDER_SITE_OTHER): Payer: Medicare Other

## 2014-05-17 DIAGNOSIS — I2699 Other pulmonary embolism without acute cor pulmonale: Secondary | ICD-10-CM

## 2014-05-17 LAB — PROTIME-INR
INR: 1.1 ratio — ABNORMAL HIGH (ref 0.8–1.0)
PROTHROMBIN TIME: 12.3 s (ref 9.6–13.1)

## 2014-05-18 DIAGNOSIS — Z961 Presence of intraocular lens: Secondary | ICD-10-CM | POA: Diagnosis not present

## 2014-05-26 ENCOUNTER — Telehealth: Payer: Self-pay | Admitting: Internal Medicine

## 2014-05-26 ENCOUNTER — Ambulatory Visit (INDEPENDENT_AMBULATORY_CARE_PROVIDER_SITE_OTHER): Payer: Medicare Other | Admitting: Family Medicine

## 2014-05-26 ENCOUNTER — Encounter: Payer: Self-pay | Admitting: Family Medicine

## 2014-05-26 VITALS — BP 131/82 | HR 75 | Temp 97.8°F | Resp 18 | Ht 63.0 in | Wt 177.0 lb

## 2014-05-26 DIAGNOSIS — J45909 Unspecified asthma, uncomplicated: Secondary | ICD-10-CM

## 2014-05-26 DIAGNOSIS — R0902 Hypoxemia: Secondary | ICD-10-CM | POA: Diagnosis not present

## 2014-05-26 DIAGNOSIS — F4321 Adjustment disorder with depressed mood: Secondary | ICD-10-CM

## 2014-05-26 DIAGNOSIS — Z86711 Personal history of pulmonary embolism: Secondary | ICD-10-CM

## 2014-05-26 DIAGNOSIS — IMO0002 Reserved for concepts with insufficient information to code with codable children: Secondary | ICD-10-CM

## 2014-05-26 LAB — PROTIME-INR
INR: 1 ratio (ref 0.8–1.0)
Prothrombin Time: 10.8 s (ref 9.6–13.1)

## 2014-05-26 NOTE — Telephone Encounter (Signed)
[id number] x1 

## 2014-05-26 NOTE — Progress Notes (Signed)
Pre visit review using our clinic review tool, if applicable. No additional management support is needed unless otherwise documented below in the visit note. 

## 2014-05-26 NOTE — Progress Notes (Signed)
OFFICE NOTE  05/26/2014  CC:  Chief Complaint  Patient presents with  . oxygen  . Advice Only    life alert  . Anticoagulation   HPI: Patient is a 68 y.o. Caucasian female who is here for f/u chronic anticoagulation with coumadin--had PE 07/2013--last INR was low, due for repeat PT/INR today Has other questions: 1) She was d/c'd home from hospitalization for acute asthma (04/23/14-04/28/14) on oxygen 2-3 L/min, asks if this should be continued and if so what settings?  Says she is using brovana bid as instructed, still uses albuterol a couple of times per day for breathlessless.  No wheezing, no productive cough, no fevers.   Says she sometimes feels easily winded and scared when she is ambulating w/out her oxygen on, does not have pulse oximetry at home.  No CP.   Eating well, drinking well.  Describes herself as doing well with trying to get over the death of her husband about 2 mo ago.  Says her daughter is trying to give her a guilt trip and says she should not be grieving anymore.  Pt says she has been going on lunch outings with friends, lots of events at church, neighbors are reaching out to her with invitations to dinner, etc and she is accepting a lot of these.  She still feels dips of sadness, esp when recent anniversary date came/went.  Pertinent PMH:  Past medical, surgical, social, and family history reviewed and no changes are noted since last office visit except:  Echo w/contrast 04/2014: EF 16-10%, grade 1 diastolic dysfxn PFTs 08/29/03 in hospital: Spirometry suggests mixed obstructive and restrictive lung disease. The FEV1 is severely reduced at 45% predicted. The FEV1 is significantly decreased compared with prior spirometry 07/23/13.   MEDS:  Outpatient Prescriptions Prior to Visit  Medication Sig Dispense Refill  . albuterol (PROVENTIL HFA;VENTOLIN HFA) 108 (90 BASE) MCG/ACT inhaler Inhale 2 puffs into the lungs every 6 (six) hours as needed for wheezing or shortness of  breath.       [name] Kitchen albuterol (PROVENTIL) (2.5 MG/3ML) 0.083% nebulizer solution Take 3 mLs (2.5 mg total) by nebulization every 6 (six) hours as needed for wheezing or shortness of breath.  75 mL  1  . ALPRAZolam (XANAX) 1 MG tablet 1/2-1 tab po tid as needed for anxiety or sleep  90 tablet  5  . amitriptyline (ELAVIL) 100 MG tablet TAKE 1 TABLET (100 MG TOTAL) BY MOUTH AT BEDTIME  30 tablet  6  . arformoterol (BROVANA) 15 MCG/2ML NEBU Take 2 mLs (15 mcg total) by nebulization 2 (two) times daily.  120 mL  3  . atorvastatin (LIPITOR) 20 MG tablet Take 1 tablet (20 mg total) by mouth daily at 6 PM.  30 tablet  6  . BYSTOLIC 5 MG tablet TAKE 1 TABLET (5 MG TOTAL) BY MOUTH DAILY.  30 tablet  2  . furosemide (LASIX) 20 MG tablet TAKE 1 TABLET BY MOUTH EVERY DAY  30 tablet  3  . isosorbide mononitrate (IMDUR) 30 MG 24 hr tablet TAKE 1 TABLET (30 MG TOTAL) BY MOUTH DAILY.  30 tablet  3  . meloxicam (MOBIC) 7.5 MG tablet Take 7.5 mg by mouth 2 (two) times daily.       . Multiple Vitamins-Minerals (CENTRUM SILVER) tablet Take 1 tablet by mouth.      . nitroGLYCERIN (NITROSTAT) 0.4 MG SL tablet Place 1 tablet (0.4 mg total) under the tongue every 5 (five) minutes x 3 doses as needed  for chest pain.  60 tablet  3  . pregabalin (LYRICA) 300 MG capsule Take 300 mg by mouth 2 (two) times daily.      [name] Kitchen SAVELLA 50 MG TABS tablet TAKE 1 TABLET (50 MG TOTAL) BY MOUTH 2 (TWO) TIMES DAILY.  60 tablet  3  . traZODone (DESYREL) 50 MG tablet Take 2-4 tabs po qhs for insomnia  120 tablet  6  . warfarin (COUMADIN) 5 MG tablet Take 5 mg by mouth daily. AS DIRECTED      . fluticasone (FLONASE) 50 MCG/ACT nasal spray Place 2 sprays into both nostrils 2 (two) times daily.  16 g  2  . loratadine (CLARITIN) 10 MG tablet Take 1 tablet (10 mg total) by mouth daily.       No facility-administered medications prior to visit.    PE: Blood pressure 131/82, pulse 75, temperature 97.8 F (36.6 C), temperature source Temporal, resp.  rate 18, height 5\' 3"  (1.6 m), weight 177 lb (80.287 kg), SpO2 93.00%. this is RA oxygen sat. Gen: Alert, well appearing.  Patient is oriented to person, place, time, and situation. Mildly slow ambulation, slightly wide based gait.   No pallor or cyanosis.  She is not wearing oxygen today. HUT:MLYY: no injection, icteris, swelling, or exudate.  EOMI, PERRLA. Mouth: lips without lesion/swelling.  Oral mucosa pink and moist. Oropharynx without erythema, exudate, or swelling.  CV: RRR, no m/r/g.   LUNGS: CTA bilat, nonlabored resps, good aeration in all lung fields. EXT: trace bilat LE pitting edema   IMPRESSION AND PLAN:  1) Persistent asthma vs COPD, with borderline hypoxia still: continue what the pulmonologist put her on in hospital, and we'll arrange a f/u appt with him (pt prefers Dr. Chase Caller, whom she saw in recent hospitalization).  I walked her in the hallways today for about 100 feet while wearing pulse oximeter: at the halfway mark her oxygen was 90 on RA, and when we got back to the room it was back at 95%. I told her that it would be fine to continue 1-2 L oxygen at this time WITH ACTIVITY but I recommended she not use oxygen if simply at rest.  2) Hx of Pulm embolism 07/2013.  Recent CT angio chest in hosp showed no PE.  Pt has been on coumadin anticoag for approx 8 months now.  Will discuss with pulm/onc possible d/c of coumadin now. PT/INR today.  3) Grief response: she is grieving appropriately but I told her today to go ahead and feel free to grieve the way she feels like she needs to, not like her daughter or anyone else says she needs to.  I stressed that it is an individual process that can take up to 6 mo, sometimes longer, and is still normal.  She expressed understanding/agreement with this.  An After Visit Summary was printed and given to the patient.  FOLLOW UP: 1 mo

## 2014-05-26 NOTE — Patient Instructions (Signed)
Use 1-2 liters of oxygen with walking or other activity.  When at rest, no oxygen is needed.

## 2014-05-27 ENCOUNTER — Other Ambulatory Visit: Payer: Medicare Other

## 2014-05-27 ENCOUNTER — Telehealth: Payer: Self-pay | Admitting: Family Medicine

## 2014-05-27 MED ORDER — ASPIRIN EC 81 MG PO TBEC: DELAYED_RELEASE_TABLET | ORAL | Status: DC

## 2014-05-27 NOTE — Telephone Encounter (Signed)
Pt seen by MW 8.25.14 for consult: Patient Instructions     Prilosec is 20 mg Take 30- 60 min before your first and last meals of the day and pepcid 20 mg at bedtime  GERD (REFLUX) is an extremely common cause of respiratory symptoms, many times with no significant heartburn at all.  It can be treated with medication, but also with lifestyle changes including avoidance of late meals, excessive alcohol, smoking cessation, and avoid fatty foods, chocolate, peppermint, colas, red wine, and acidic juices such as orange juice.  NO MINT OR MENTHOL PRODUCTS SO NO COUGH DROPS  USE SUGARLESS CANDY INSTEAD (jolley ranchers or Stover's)  NO OIL BASED VITAMINS - use powdered substitutes.  Prednisone 20 mg per day if you breathing and cough drop it to 10 mg daily  Take delsym two tsp every 12 hours and supplement if needed with tramadol 50 mg up to 2 every 4 hours to suppress the urge to cough. Swallowing water or using ice chips/non mint and menthol containing candies (such as lifesavers or sugarless jolly ranchers) are also effective. You should rest your voice and avoid activities that you know make you cough.  Once you have eliminated the cough for 3 straight days try reducing the tramadol first, then the delsym as tolerated.  If desire to get your follow up here: See [name] NP w/in 2 weeks with all your medications, even over the counter meds, separated in two separate bags, the ones you take no matter what vs the ones you stop once you feel better and take only as needed when you feel you need them. [name] will generate for you a new user friendly medication calendar that will put [place] all on the same page re: your medication use.  Without this process, it simply isn't possible to assure that we are providing your outpatient care with the attention to detail we feel you deserve. If we cannot assure that you're getting that kind of care, then we cannot manage your problem effectively from this clinic.  Once you  have seen [name] and we are sure that we're all on the same page with your medication use she will arrange follow up with me.  Late add PFT's next   ========================================= Called spoke with patient who reported that she would like to change physicians from MW to MR.  Pt is aware that she is overdue for follow up and declined another appt with MW at this time.  Pt was advised of our office protocol to switch providers and is okay with this process being started.  Dr Melvyn Novas, please advise if okay for pt to switch to MR.  Thank you.  **pt recently lost her husband.  She will be on a "prayer retreat" June 8th, 9th, and 10th.  She will be unable to talk on the phone during that time.

## 2014-05-27 NOTE — Telephone Encounter (Signed)
Discussed pt's case with Dr. Lattie Haw, her hem/onc provider. I asked his opinion on whether or not she could come off coumadin since she has been on this med for about 10 months and her PE was subsegmental and she had recent CT angio of chest during hospitalization 03/2014 that showed no PE.  He agreed she could come off coumadin and switch to 162 mg ASA qd.  I called pt and notified her of this change today and she expressed understanding/agreement with this plan. I also clarified with pt whether or not she sees a pain mgmt MD (her [facility] rx profile that I was recently sent had rx for vicodin and seroquel filled 04/18/14, both are meds I do not have record of her being on from any of her other providers. She said her rheumatologist, Dr. Lenna Gilford, had her see an MD in her office that sounds like is a pain mgmt MD, but pt says she has no plans to f/u with him. She cannot recall his name other than "his patients have trouble pronouncing his name so they call him Dr. Elta Guadeloupe".

## 2014-05-27 NOTE — Telephone Encounter (Signed)
LMOM TCB x1 for pt to see if she will schedule a med calendar appt w/ TP as recommended by MW.  MR also utilizes this service provided by TP and would likely aid him if she agrees.

## 2014-05-27 NOTE — Telephone Encounter (Signed)
No need for f/u with any of our docs until she does meaningful and accurate med rec with our NP and if she declines I would not ask dr Chase Caller to see her but it's fine with me if she insists and he agrees.

## 2014-05-30 NOTE — Telephone Encounter (Signed)
As documented below on 6.5.15, pt will be on a "prayer retreat" on 6/8, 6/9, and 6/10.  She requests to not be called until after she returns from this trip.

## 2014-06-06 DIAGNOSIS — M5137 Other intervertebral disc degeneration, lumbosacral region: Secondary | ICD-10-CM | POA: Diagnosis not present

## 2014-06-06 DIAGNOSIS — G894 Chronic pain syndrome: Secondary | ICD-10-CM | POA: Diagnosis not present

## 2014-06-06 DIAGNOSIS — Z79899 Other long term (current) drug therapy: Secondary | ICD-10-CM | POA: Diagnosis not present

## 2014-06-06 DIAGNOSIS — IMO0001 Reserved for inherently not codable concepts without codable children: Secondary | ICD-10-CM | POA: Diagnosis not present

## 2014-06-10 ENCOUNTER — Ambulatory Visit (HOSPITAL_BASED_OUTPATIENT_CLINIC_OR_DEPARTMENT_OTHER): Payer: Medicare Other

## 2014-06-10 ENCOUNTER — Ambulatory Visit (HOSPITAL_BASED_OUTPATIENT_CLINIC_OR_DEPARTMENT_OTHER): Payer: Medicare Other | Admitting: Hematology & Oncology

## 2014-06-10 ENCOUNTER — Ambulatory Visit (HOSPITAL_BASED_OUTPATIENT_CLINIC_OR_DEPARTMENT_OTHER): Payer: Medicare Other | Admitting: Lab

## 2014-06-10 DIAGNOSIS — D631 Anemia in chronic kidney disease: Secondary | ICD-10-CM | POA: Diagnosis not present

## 2014-06-10 DIAGNOSIS — N039 Chronic nephritic syndrome with unspecified morphologic changes: Secondary | ICD-10-CM

## 2014-06-10 DIAGNOSIS — D509 Iron deficiency anemia, unspecified: Secondary | ICD-10-CM

## 2014-06-10 DIAGNOSIS — M1990 Unspecified osteoarthritis, unspecified site: Secondary | ICD-10-CM

## 2014-06-10 DIAGNOSIS — D510 Vitamin B12 deficiency anemia due to intrinsic factor deficiency: Secondary | ICD-10-CM

## 2014-06-10 DIAGNOSIS — I2699 Other pulmonary embolism without acute cor pulmonale: Secondary | ICD-10-CM

## 2014-06-10 DIAGNOSIS — N189 Chronic kidney disease, unspecified: Secondary | ICD-10-CM

## 2014-06-10 DIAGNOSIS — M129 Arthropathy, unspecified: Secondary | ICD-10-CM | POA: Diagnosis not present

## 2014-06-10 LAB — IRON AND TIBC CHCC
%SAT: 33 % (ref 21–57)
Iron: 74 ug/dL (ref 41–142)
TIBC: 228 ug/dL — ABNORMAL LOW (ref 236–444)
UIBC: 153 ug/dL (ref 120–384)

## 2014-06-10 LAB — CBC WITH DIFFERENTIAL (CANCER CENTER ONLY)
BASO#: 0 10*3/uL (ref 0.0–0.2)
BASO%: 0.2 % (ref 0.0–2.0)
EOS ABS: 0.1 10*3/uL (ref 0.0–0.5)
EOS%: 1.3 % (ref 0.0–7.0)
HEMATOCRIT: 33.5 % — AB (ref 34.8–46.6)
HEMOGLOBIN: 10.8 g/dL — AB (ref 11.6–15.9)
LYMPH#: 1.9 10*3/uL (ref 0.9–3.3)
LYMPH%: 19.7 % (ref 14.0–48.0)
MCH: 31.3 pg (ref 26.0–34.0)
MCHC: 32.2 g/dL (ref 32.0–36.0)
MCV: 97 fL (ref 81–101)
MONO#: 0.6 10*3/uL (ref 0.1–0.9)
MONO%: 5.7 % (ref 0.0–13.0)
NEUT#: 7 10*3/uL — ABNORMAL HIGH (ref 1.5–6.5)
NEUT%: 73.1 % (ref 39.6–80.0)
Platelets: 186 10*3/uL (ref 145–400)
RBC: 3.45 10*6/uL — AB (ref 3.70–5.32)
RDW: 14.3 % (ref 11.1–15.7)
WBC: 9.6 10*3/uL (ref 3.9–10.0)

## 2014-06-10 LAB — PROTIME-INR (CHCC SATELLITE)
INR: 1 — ABNORMAL LOW (ref 2.0–3.5)
Protime: 12 Seconds (ref 10.6–13.4)

## 2014-06-10 LAB — RETICULOCYTES (CHCC)
ABS RETIC: 49.3 10*3/uL (ref 19.0–186.0)
RBC.: 3.52 MIL/uL — AB (ref 3.87–5.11)
Retic Ct Pct: 1.4 % (ref 0.4–2.3)

## 2014-06-10 LAB — CHCC SATELLITE - SMEAR

## 2014-06-10 LAB — FERRITIN CHCC: Ferritin: 2339 ng/ml — ABNORMAL HIGH (ref 9–269)

## 2014-06-10 MED ORDER — DARBEPOETIN ALFA-POLYSORBATE 300 MCG/0.6ML IJ SOLN: 300.0000 ug | Freq: Once | INTRAMUSCULAR | Status: DC

## 2014-06-10 MED ORDER — DARBEPOETIN ALFA-POLYSORBATE 300 MCG/0.6ML IJ SOLN
INTRAMUSCULAR | Status: AC
  Filled 2014-06-10: qty 0.6

## 2014-06-10 MED ORDER — CYANOCOBALAMIN 1000 MCG/ML IJ SOLN
1000.0000 ug | Freq: Once | INTRAMUSCULAR | Status: AC
  Administered 2014-06-10: 1000 ug via INTRAMUSCULAR

## 2014-06-10 MED ORDER — CYANOCOBALAMIN 1000 MCG/ML IJ SOLN
INTRAMUSCULAR | Status: AC
  Filled 2014-06-10: qty 1

## 2014-06-10 NOTE — Patient Instructions (Signed)
Cyanocobalamin, Pyridoxine, and Folate What is this medicine? A multivitamin containing folic acid, vitamin B6, and vitamin B12. This medicine may be used for other purposes; ask your health care provider or pharmacist if you have questions. COMMON BRAND NAME(S): AllanFol RX, AllanTex, ComBgen, FaBB, Folamin, Folastin, [place], [place], [place], [place], [place], Folgard RX, Carl RX 2.2, [place], [place] 2.2, Foltabs 800, Foltx, Homocysteine Formula, NuFol, TL [employer], Virt-Vite, Virt-Vite [place], Vita-Respa What should I tell my health care provider before I take this medicine? They need to know if you have any of these conditions: -bleeding or clotting disorder -history of anemia of any type -other chronic health condition -an unusual or allergic reaction to vitamins, other medicines, foods, dyes, or preservatives -pregnant or trying to get pregnant -breast-feeding How should I use this medicine? Take by mouth with a glass of water. May take with food. Follow the directions on the prescription label. It is usually given once a day. Do not take your medicine more often than directed. Contact your pediatrician regarding the use of this medicine in children. Special care may be needed. Overdosage: If you think you have taken too much of this medicine contact a poison control center or emergency room at once. NOTE: This medicine is only for you. Do not share this medicine with others. What if I miss a dose? If you miss a dose, take it as soon as you can. If it is almost time for your next dose, take only that dose. Do not take double or extra doses. What may interact with this medicine? -levodopa This list may not describe all possible interactions. Give your health care provider a list of all the medicines, herbs, non-prescription drugs, or dietary supplements you use. Also tell them if you smoke, drink alcohol, or use illegal drugs. Some items may interact with your medicine. What should I  watch for while using this medicine? See your health care professional for regular checks on your progress. Remember that vitamin supplements do not replace the need for good nutrition from a balanced diet. What side effects may I notice from receiving this medicine? Side effects that you should report to your doctor or health care professional as soon as possible: -allergic reaction such as skin rash or difficulty breathing -vomiting Side effects that usually do not require medical attention (report to your doctor or health care professional if they continue or are bothersome): -nausea -stomach upset This list may not describe all possible side effects. Call your doctor for medical advice about side effects. You may report side effects to FDA at 1-800-FDA-1088. Where should I keep my medicine? Keep out of the reach of children. Most vitamins should be stored at controlled room temperature. Check your specific product directions. Protect from heat and moisture. Throw away any unused medicine after the expiration date. NOTE: This sheet is a summary. It may not cover all possible information. If you have questions about this medicine, talk to your doctor, pharmacist, or health care provider.  2015, Elsevier/Gold Standard. (2008-01-30 00:59:55) Darbepoetin Alfa injection What is this medicine? DARBEPOETIN ALFA (dar be POE e tin AL fa) helps your body make more red blood cells. It is used to treat anemia caused by chronic kidney failure and chemotherapy. This medicine may be used for other purposes; ask your health care provider or pharmacist if you have questions. COMMON BRAND NAME(S): Aranesp What should I tell my health care provider before I take this medicine? They need to know if you have any of  these conditions: -blood clotting disorders or history of blood clots -cancer patient not on chemotherapy -cystic fibrosis -heart disease, such as angina, heart failure, or a history of a heart  attack -hemoglobin level of 12 g/dL or greater -high blood pressure -low levels of folate, iron, or vitamin B12 -seizures -an unusual or allergic reaction to darbepoetin, erythropoietin, albumin, hamster proteins, latex, other medicines, foods, dyes, or preservatives -pregnant or trying to get pregnant -breast-feeding How should I use this medicine? This medicine is for injection into a vein or under the skin. It is usually given by a health care professional in a hospital or clinic setting. If you get this medicine at home, you will be taught how to prepare and give this medicine. Do not shake the solution before you withdraw a dose. Use exactly as directed. Take your medicine at regular intervals. Do not take your medicine more often than directed. It is important that you put your used needles and syringes in a special sharps container. Do not put them in a trash can. If you do not have a sharps container, call your pharmacist or healthcare provider to get one. Talk to your pediatrician regarding the use of this medicine in children. While this medicine may be used in children as young as 1 year for selected conditions, precautions do apply. Overdosage: If you think you have taken too much of this medicine contact a poison control center or emergency room at once. NOTE: This medicine is only for you. Do not share this medicine with others. What if I miss a dose? If you miss a dose, take it as soon as you can. If it is almost time for your next dose, take only that dose. Do not take double or extra doses. What may interact with this medicine? Do not take this medicine with any of the following medications: -epoetin alfa This list may not describe all possible interactions. Give your health care provider a list of all the medicines, herbs, non-prescription drugs, or dietary supplements you use. Also tell them if you smoke, drink alcohol, or use illegal drugs. Some items may interact with your  medicine. What should I watch for while using this medicine? Visit your prescriber or health care professional for regular checks on your progress and for the needed blood tests and blood pressure measurements. It is especially important for the doctor to make sure your hemoglobin level is in the desired range, to limit the risk of potential side effects and to give you the best benefit. Keep all appointments for any recommended tests. Check your blood pressure as directed. Ask your doctor what your blood pressure should be and when you should contact him or her. As your body makes more red blood cells, you may need to take iron, folic acid, or vitamin B supplements. Ask your doctor or health care provider which products are right for you. If you have kidney disease continue dietary restrictions, even though this medication can make you feel better. Talk with your doctor or health care professional about the foods you eat and the vitamins that you take. What side effects may I notice from receiving this medicine? Side effects that you should report to your doctor or health care professional as soon as possible: -allergic reactions like skin rash, itching or hives, swelling of the face, lips, or tongue -breathing problems -changes in vision -chest pain -confusion, trouble speaking or understanding -feeling faint or lightheaded, falls -high blood pressure -muscle aches or pains -pain, swelling,  warmth in the leg -rapid weight gain -severe headaches -sudden numbness or weakness of the face, arm or leg -trouble walking, dizziness, loss of balance or coordination -seizures (convulsions) -swelling of the ankles, feet, hands -unusually weak or tired Side effects that usually do not require medical attention (report to your doctor or health care professional if they continue or are bothersome): -diarrhea -fever, chills (flu-like symptoms) -headaches -nausea, vomiting -redness, stinging, or  swelling at site where injected This list may not describe all possible side effects. Call your doctor for medical advice about side effects. You may report side effects to FDA at 1-800-FDA-1088. Where should I keep my medicine? Keep out of the reach of children. Store in a refrigerator between 2 and 8 degrees C (36 and 46 degrees F). Do not freeze. Do not shake. Throw away any unused portion if using a single-dose vial. Throw away any unused medicine after the expiration date. NOTE: This sheet is a summary. It may not cover all possible information. If you have questions about this medicine, talk to your doctor, pharmacist, or health care provider.  2015, Elsevier/Gold Standard. (2008-11-22 10:23:57)

## 2014-06-10 NOTE — Progress Notes (Deleted)
Hematology and Oncology Follow Up Visit  Linda [name] [id number] 1946-10-11 68 y.o. 06/10/2014   Principle Diagnosis:  ematology and Oncology Follow Up Visit  Linda [name] [id number] 12/29/1945 68 y.o. 05/11/2014   Principle Diagnosis:  Idiopathic pulmonary embolism. 2. Recurrent iron-deficiency anemia. 3. Anemia of renal insufficiency 3. Chronic congestive heart failure. 4. [name]. 5.  Pernicious anemia  Current Therapy:   IV iron as indicated. 2. Coumadin-2-year duration. 3. Vit B12 1mg  IM q month 4. Aranesp 373mcg sq as needed for gb < 11.0     Interim History:  [name] is doing older better. She still is 7 problems with cognitive cope with her husbands death. He had malignancy. Had mesothelioma.  She is undergoing some dental work. She is off her Coumadin. She restarted the Coumadin yesterday. She's having some crown work done. I don't think she needs to be off Coumadin for the crowns replaced. This might be another 3 weeks. She does have some Lovenox at home which she is taking.  She does feel some fatigue. We have given her B12.  Her erythropoietin level is only 6.3. As such, she needs Aranesp in order to help improve her anemia.   She's had no bleeding. She is bad arthritic issues. She has had injections into her knees. She is on oxygen. She is on chronic oxygen. She's on nebulizers.   Medications: Current outpatient prescriptions:albuterol (PROVENTIL HFA;VENTOLIN HFA) 108 (90 BASE) MCG/ACT inhaler, Inhale 2 puffs into the lungs every 6 (six) hours as needed for wheezing or shortness of breath. , Disp: , Rfl: ;  albuterol (PROVENTIL) (2.5 MG/3ML) 0.083% nebulizer solution, Take 3 mLs (2.5 mg total) by nebulization every 6 (six) hours as needed for wheezing or shortness of breath., Disp: 75 mL, Rfl: 1 ALPRAZolam (XANAX) 1 MG tablet, 1/2-1 tab po tid as needed for anxiety or sleep, Disp: 90 tablet, Rfl: 5;  amitriptyline (ELAVIL) 100 MG tablet, TAKE 1  TABLET (100 MG TOTAL) BY MOUTH AT BEDTIME, Disp: 30 tablet, Rfl: 6;  arformoterol (BROVANA) 15 MCG/2ML NEBU, Take 2 mLs (15 mcg total) by nebulization 2 (two) times daily., Disp: 120 mL, Rfl: 3 atorvastatin (LIPITOR) 20 MG tablet, Take 1 tablet (20 mg total) by mouth daily at 6 PM., Disp: 30 tablet, Rfl: 6;  fluticasone (FLONASE) 50 MCG/ACT nasal spray, Place 2 sprays into both nostrils 2 (two) times daily., Disp: 16 g, Rfl: 2;  furosemide (LASIX) 20 MG tablet, TAKE 1 TABLET BY MOUTH EVERY DAY, Disp: 30 tablet, Rfl: 3;  isosorbide mononitrate (IMDUR) 30 MG 24 hr tablet, Take 1 tablet (30 mg total) by mouth daily., Disp: 30 tablet, Rfl: 3 loratadine (CLARITIN) 10 MG tablet, Take 1 tablet (10 mg total) by mouth daily., Disp: , Rfl: ;  meloxicam (MOBIC) 7.5 MG tablet, Take 7.5 mg by mouth 2 (two) times daily. , Disp: , Rfl: ;  Milnacipran (SAVELLA) 50 MG TABS tablet, Take 1 tablet (50 mg total) by mouth 2 (two) times daily., Disp: 60 tablet, Rfl: 3;  Multiple Vitamins-Minerals (CENTRUM SILVER) tablet, Take 1 tablet by mouth., Disp: , Rfl:  nebivolol (BYSTOLIC) 5 MG tablet, Take 1 tablet (5 mg total) by mouth daily., Disp: 30 tablet, Rfl: 2;  nitroGLYCERIN (NITROSTAT) 0.4 MG SL tablet, Place 1 tablet (0.4 mg total) under the tongue every 5 (five) minutes x 3 doses as needed for chest pain., Disp: 60 tablet, Rfl: 3;  pregabalin (LYRICA) 300 MG capsule, Take 300 mg by mouth 2 (two) times daily., Disp: ,  Rfl:  traZODone (DESYREL) 50 MG tablet, Take 2-4 tabs po qhs for insomnia, Disp: 120 tablet, Rfl: 6;  warfarin (COUMADIN) 5 MG tablet, Take 5 mg by mouth daily. AS DIRECTED, Disp: , Rfl:  No current facility-administered medications for this visit. Facility-Administered Medications Ordered in Other Visits: famotidine (PEPCID) IVPB 40 mg, 40 mg, Intravenous, Q12H, Volanda Napoleon, MD, 40 mg at 05/11/14 1328  Allergies:  Allergies  Allergen Reactions  . Penicillins Itching, Swelling and Rash    Tolerated Cefepime  in ED.    Past Medical History, Surgical history, Social history, and Family History were reviewed and updated.  Review of Systems: As above  Physical Exam:  height is 5\' 4"  (1.626 m) and weight is 173 lb (78.472 kg). Her oral temperature is 97.8 F (36.6 C). Her blood pressure is 123/74 and her pulse is 77. Her respiration is 14 and oxygen saturation is 99%.   Decreased breath sounds throughout all lung fields. Cardiac exam regular in rhythm. Abdomen soft. There is no liver or spleen. Exam no tenderness. Extremities shows some 1+ edema. Joints are arthritic. Skin exam no rashes.  Lab Results  Component Value Date   WBC 6.7 05/11/2014   HGB 10.8* 05/11/2014   HCT 33.2* 05/11/2014   MCV 94 05/11/2014   PLT 232 05/11/2014     Chemistry      Component Value Date/Time   NA 141 04/28/2014 0709   K 3.5* 04/28/2014 0709   CL 100 04/28/2014 0709   CO2 27 04/28/2014 0709   BUN 19 04/28/2014 0709   CREATININE 0.73 04/28/2014 0709   CREATININE 0.65 05/27/2013 1706      Component Value Date/Time   CALCIUM 8.8 04/28/2014 0709   ALKPHOS 66 04/23/2014 1800   AST 72* 04/23/2014 1800   ALT 31 04/23/2014 1800   BILITOT 0.3 04/23/2014 1800         Impression and Plan: Linda [name] is 68 year old female. We will go ahead and give her iron today. We'll also order a B12.  Again, her ferritin is an acute phase reactant.  Her reticulocyte count is quite low. This, tell me that we had to give her iron.  She does have a low erythropoietin level. I don't thing we have to give her an Aranesp today.  We will plan to get her back in another 4 weeks. I suspect we will have to give her Aranesp then.   Volanda Napoleon, MD 5/20/20157:08 PM  Current Therapy:     as above     Interim History:  Ms.  [name] is ***  Medications: Current outpatient prescriptions:albuterol (PROVENTIL HFA;VENTOLIN HFA) 108 (90 BASE) MCG/ACT inhaler, Inhale 2 puffs into the lungs every 6 (six) hours as needed for wheezing or shortness of  breath. , Disp: , Rfl: ;  albuterol (PROVENTIL) (2.5 MG/3ML) 0.083% nebulizer solution, Take 3 mLs (2.5 mg total) by nebulization every 6 (six) hours as needed for wheezing or shortness of breath., Disp: 75 mL, Rfl: 1 ALPRAZolam (XANAX) 1 MG tablet, 1/2-1 tab po tid as needed for anxiety or sleep, Disp: 90 tablet, Rfl: 5;  amitriptyline (ELAVIL) 100 MG tablet, TAKE 1 TABLET (100 MG TOTAL) BY MOUTH AT BEDTIME, Disp: 30 tablet, Rfl: 6;  atorvastatin (LIPITOR) 20 MG tablet, Take 1 tablet (20 mg total) by mouth daily at 6 PM., Disp: 30 tablet, Rfl: 6;  BYSTOLIC 5 MG tablet, TAKE 1 TABLET (5 MG TOTAL) BY MOUTH DAILY., Disp: 30 tablet, Rfl: 2 furosemide (LASIX)  20 MG tablet, TAKE 1 TABLET BY MOUTH EVERY DAY, Disp: 30 tablet, Rfl: 3;  isosorbide mononitrate (IMDUR) 30 MG 24 hr tablet, TAKE 1 TABLET (30 MG TOTAL) BY MOUTH DAILY., Disp: 30 tablet, Rfl: 3;  meloxicam (MOBIC) 7.5 MG tablet, Take 7.5 mg by mouth 2 (two) times daily. , Disp: , Rfl: ;  Multiple Vitamins-Minerals (CENTRUM SILVER) tablet, Take 1 tablet by mouth., Disp: , Rfl:  nitroGLYCERIN (NITROSTAT) 0.4 MG SL tablet, Place 1 tablet (0.4 mg total) under the tongue every 5 (five) minutes x 3 doses as needed for chest pain., Disp: 60 tablet, Rfl: 3;  pregabalin (LYRICA) 300 MG capsule, Take 300 mg by mouth 2 (two) times daily., Disp: , Rfl: ;  SAVELLA 50 MG TABS tablet, TAKE 1 TABLET (50 MG TOTAL) BY MOUTH 2 (TWO) TIMES DAILY., Disp: 60 tablet, Rfl: 3 traZODone (DESYREL) 50 MG tablet, Take 2-4 tabs po qhs for insomnia, Disp: 120 tablet, Rfl: 6  Allergies:  Allergies  Allergen Reactions  . Penicillins Itching, Swelling and Rash    Tolerated Cefepime in ED.    Past Medical History, Surgical history, Social history, and Family History were reviewed and updated.  Review of Systems: ***  Physical Exam:  vitals were not taken for this visit.  ***  Lab Results  Component Value Date   WBC 9.6 06/10/2014   HGB 10.8* 06/10/2014   HCT 33.5* 06/10/2014    MCV 97 06/10/2014   PLT 186 06/10/2014     Chemistry      Component Value Date/Time   NA 141 04/28/2014 0709   K 3.5* 04/28/2014 0709   CL 100 04/28/2014 0709   CO2 27 04/28/2014 0709   BUN 19 04/28/2014 0709   CREATININE 0.73 04/28/2014 0709   CREATININE 0.65 05/27/2013 1706      Component Value Date/Time   CALCIUM 8.8 04/28/2014 0709   ALKPHOS 66 04/23/2014 1800   AST 72* 04/23/2014 1800   ALT 31 04/23/2014 1800   BILITOT 0.3 04/23/2014 1800         Impression and Plan: Ms. Mckiver is ***   Volanda Napoleon, MD 6/19/20159:45 AM

## 2014-06-10 NOTE — Progress Notes (Signed)
No Aranesp given today due to not meeting lab parameters per [facility].  Dr. Marin Olp notified

## 2014-06-13 ENCOUNTER — Encounter: Payer: Self-pay | Admitting: *Deleted

## 2014-06-13 ENCOUNTER — Encounter: Payer: Self-pay | Admitting: Nurse Practitioner

## 2014-06-13 ENCOUNTER — Ambulatory Visit (HOSPITAL_BASED_OUTPATIENT_CLINIC_OR_DEPARTMENT_OTHER): Payer: Medicare Other | Admitting: Lab

## 2014-06-13 ENCOUNTER — Other Ambulatory Visit: Payer: Self-pay | Admitting: Family Medicine

## 2014-06-13 DIAGNOSIS — N189 Chronic kidney disease, unspecified: Secondary | ICD-10-CM

## 2014-06-13 DIAGNOSIS — M1990 Unspecified osteoarthritis, unspecified site: Secondary | ICD-10-CM

## 2014-06-13 DIAGNOSIS — I2699 Other pulmonary embolism without acute cor pulmonale: Secondary | ICD-10-CM

## 2014-06-13 DIAGNOSIS — Z7901 Long term (current) use of anticoagulants: Secondary | ICD-10-CM | POA: Diagnosis not present

## 2014-06-13 DIAGNOSIS — D509 Iron deficiency anemia, unspecified: Secondary | ICD-10-CM

## 2014-06-13 DIAGNOSIS — D631 Anemia in chronic kidney disease: Secondary | ICD-10-CM

## 2014-06-13 LAB — CBC WITH DIFFERENTIAL (CANCER CENTER ONLY)
BASO#: 0 10*3/uL (ref 0.0–0.2)
BASO%: 0.5 % (ref 0.0–2.0)
EOS%: 1 % (ref 0.0–7.0)
Eosinophils Absolute: 0.1 10*3/uL (ref 0.0–0.5)
HCT: 38.3 % (ref 34.8–46.6)
HGB: 13.2 g/dL (ref 11.6–15.9)
LYMPH#: 1.3 10*3/uL (ref 0.9–3.3)
LYMPH%: 23.3 % (ref 14.0–48.0)
MCH: 31.9 pg (ref 26.0–34.0)
MCHC: 34.5 g/dL (ref 32.0–36.0)
MCV: 93 fL (ref 81–101)
MONO#: 0.6 10*3/uL (ref 0.1–0.9)
MONO%: 10.4 % (ref 0.0–13.0)
NEUT#: 3.7 10*3/uL (ref 1.5–6.5)
NEUT%: 64.8 % (ref 39.6–80.0)
Platelets: 214 10*3/uL (ref 145–400)
RBC: 4.14 10*6/uL (ref 3.70–5.32)
RDW: 13.3 % (ref 11.1–15.7)
WBC: 5.8 10*3/uL (ref 3.9–10.0)

## 2014-06-13 LAB — PROTIME-INR (CHCC SATELLITE)
INR: 1.1 — AB (ref 2.0–3.5)
PROTIME: 13.2 s (ref 10.6–13.4)

## 2014-06-13 NOTE — Progress Notes (Signed)
Hematology and Oncology Follow Up Visit  Linda [name] [id number] [birth year] 68 y.o. 06/13/2014   Principle Diagnosis:  Idiopathic pulmonary embolism. 2. Recurrent iron-deficiency anemia. 3. Chronic congestive heart failure. 4. [name]. 5.  Pernicious anemia   Current Therapy:   IV iron as indicated. 2. Coumadin-2-year duration. 3. Vit B12 1mg  IM q month     Interim History:  Ms.  [name] is back for followup. She actually looks good to me. She is having dental work done. She's having crowns placed. She has been off Coumadin. I told her to get back onto Coumadin. She will and we will recheck her INR in 3 days. She will be going on a trip. She will be out for about a week or so.  She still has the arthralgias. She has a markedly elevated ferritin which is reflective of her underlying fibromyalgia.  She has underlying lung disease. She is on oxygen. She does have nebulizers. We last saw her, her ferritin was 1300 with iron saturation 27%.    Medications: Current outpatient prescriptions:albuterol (PROVENTIL HFA;VENTOLIN HFA) 108 (90 BASE) MCG/ACT inhaler, Inhale 2 puffs into the lungs every 6 (six) hours as needed for wheezing or shortness of breath. , Disp: , Rfl: ;  albuterol (PROVENTIL) (2.5 MG/3ML) 0.083% nebulizer solution, Take 3 mLs (2.5 mg total) by nebulization every 6 (six) hours as needed for wheezing or shortness of breath., Disp: 75 mL, Rfl: 1 ALPRAZolam (XANAX) 1 MG tablet, 1/2-1 tab po tid as needed for anxiety or sleep, Disp: 90 tablet, Rfl: 5;  amitriptyline (ELAVIL) 100 MG tablet, TAKE 1 TABLET (100 MG TOTAL) BY MOUTH AT BEDTIME, Disp: 30 tablet, Rfl: 6;  atorvastatin (LIPITOR) 20 MG tablet, Take 1 tablet (20 mg total) by mouth daily at 6 PM., Disp: 30 tablet, Rfl: 6;  BYSTOLIC 5 MG tablet, TAKE 1 TABLET (5 MG TOTAL) BY MOUTH DAILY., Disp: 30 tablet, Rfl: 2 furosemide (LASIX) 20 MG tablet, TAKE 1 TABLET BY MOUTH EVERY DAY, Disp: 30 tablet, Rfl: 3;  isosorbide  mononitrate (IMDUR) 30 MG 24 hr tablet, TAKE 1 TABLET (30 MG TOTAL) BY MOUTH DAILY., Disp: 30 tablet, Rfl: 3;  meloxicam (MOBIC) 7.5 MG tablet, Take 7.5 mg by mouth 2 (two) times daily. , Disp: , Rfl: ;  Multiple Vitamins-Minerals (CENTRUM SILVER) tablet, Take 1 tablet by mouth., Disp: , Rfl:  nitroGLYCERIN (NITROSTAT) 0.4 MG SL tablet, Place 1 tablet (0.4 mg total) under the tongue every 5 (five) minutes x 3 doses as needed for chest pain., Disp: 60 tablet, Rfl: 3;  pregabalin (LYRICA) 300 MG capsule, Take 300 mg by mouth 2 (two) times daily., Disp: , Rfl: ;  SAVELLA 50 MG TABS tablet, TAKE 1 TABLET (50 MG TOTAL) BY MOUTH 2 (TWO) TIMES DAILY., Disp: 60 tablet, Rfl: 3 traZODone (DESYREL) 50 MG tablet, Take 2-4 tabs po qhs for insomnia, Disp: 120 tablet, Rfl: 6  Allergies:  Allergies  Allergen Reactions  . Penicillins Itching, Swelling and Rash    Tolerated Cefepime in ED.    Past Medical History, Surgical history, Social history, and Family History were reviewed and updated.  Review of Systems: As above  Physical Exam:  vitals were not taken for this visit.  Lungs are decreased at the bases. Cardiac exam regular in room. Abdomen is soft. She has good bowel sounds. There is no fluid or liver edge. Extremities shows some trace edema which is chronic. Skin exam no rashes. Neurological exam nonfocal.  Lab Results  Component Value Date  WBC 9.6 06/10/2014   HGB 10.8* 06/10/2014   HCT 33.5* 06/10/2014   MCV 97 06/10/2014   PLT 186 06/10/2014     Chemistry      Component Value Date/Time   NA 141 04/28/2014 0709   K 3.5* 04/28/2014 0709   CL 100 04/28/2014 0709   CO2 27 04/28/2014 0709   BUN 19 04/28/2014 0709   CREATININE 0.73 04/28/2014 0709   CREATININE 0.65 05/27/2013 1706      Component Value Date/Time   CALCIUM 8.8 04/28/2014 0709   ALKPHOS 66 04/23/2014 1800   AST 72* 04/23/2014 1800   ALT 31 04/23/2014 1800   BILITOT 0.3 04/23/2014 1800      Ferritin 2339. Iron saturation 33%. Total iron  74.   Impression and Plan: Linda [name] is a 68 year old female patient multiple medical issues. She is on Coumadin. She'll be on Coumadin having for a couple years.  Again she needs to get back on Coumadin and get therapeutic.  We will go ahead and give her B12 today.  She does not qualify for Aranesp right now. Her were appropriate level is very low. However, insurance does not care about this.  We will plan to go back to see me probably in about 4-6 weeks.   Volanda Napoleon, MD 6/22/20157:32 AM

## 2014-06-13 NOTE — Progress Notes (Signed)
Per Dr. Marin Olp pt is on Coumadin 5mg  she has been instructed to begin taking 10mg  due to an INR of 1.1. She will return on Friday for a repeat lab draw to see if she is closer to therapeutic levels. Pt verbalized understanding and appreciation.

## 2014-06-17 ENCOUNTER — Other Ambulatory Visit (HOSPITAL_BASED_OUTPATIENT_CLINIC_OR_DEPARTMENT_OTHER): Payer: Medicare Other | Admitting: Lab

## 2014-06-17 ENCOUNTER — Telehealth: Payer: Self-pay | Admitting: Hematology & Oncology

## 2014-06-17 ENCOUNTER — Other Ambulatory Visit: Payer: Self-pay | Admitting: *Deleted

## 2014-06-17 DIAGNOSIS — I2699 Other pulmonary embolism without acute cor pulmonale: Secondary | ICD-10-CM

## 2014-06-17 LAB — PROTIME-INR (CHCC SATELLITE)
INR: 1.3 — ABNORMAL LOW (ref 2.0–3.5)
Protime: 15.6 Seconds — ABNORMAL HIGH (ref 10.6–13.4)

## 2014-06-17 NOTE — Progress Notes (Signed)
Left voicemail on patient's phone informing her that she needs to continue taking her 10mg  of Warfarin each day. Dr Marin Olp would like to see her on July 1st for a lab appt.

## 2014-06-17 NOTE — Telephone Encounter (Signed)
Left pt message with 7-1 appointment.

## 2014-06-20 ENCOUNTER — Telehealth: Payer: Self-pay | Admitting: Hematology & Oncology

## 2014-06-20 NOTE — Telephone Encounter (Signed)
Pt moved 7-1 to 7-10

## 2014-06-21 ENCOUNTER — Telehealth: Payer: Self-pay | Admitting: Hematology & Oncology

## 2014-06-21 NOTE — Telephone Encounter (Signed)
Per RN I called to reschedule lab this week, pt said she was going out of town. I transferred call to RN

## 2014-06-22 ENCOUNTER — Other Ambulatory Visit: Payer: Medicare Other | Admitting: Lab

## 2014-06-22 DIAGNOSIS — K922 Gastrointestinal hemorrhage, unspecified: Secondary | ICD-10-CM

## 2014-06-22 HISTORY — DX: Gastrointestinal hemorrhage, unspecified: K92.2

## 2014-06-23 ENCOUNTER — Ambulatory Visit: Payer: Medicare Other | Admitting: Family Medicine

## 2014-06-30 ENCOUNTER — Other Ambulatory Visit: Payer: Self-pay | Admitting: *Deleted

## 2014-06-30 DIAGNOSIS — D509 Iron deficiency anemia, unspecified: Secondary | ICD-10-CM

## 2014-07-01 ENCOUNTER — Other Ambulatory Visit: Payer: Self-pay | Admitting: Family Medicine

## 2014-07-01 ENCOUNTER — Other Ambulatory Visit (HOSPITAL_BASED_OUTPATIENT_CLINIC_OR_DEPARTMENT_OTHER): Payer: Medicare Other | Admitting: Lab

## 2014-07-01 ENCOUNTER — Telehealth: Payer: Self-pay | Admitting: Family Medicine

## 2014-07-01 DIAGNOSIS — D509 Iron deficiency anemia, unspecified: Secondary | ICD-10-CM

## 2014-07-01 DIAGNOSIS — I2699 Other pulmonary embolism without acute cor pulmonale: Secondary | ICD-10-CM | POA: Diagnosis not present

## 2014-07-01 LAB — CBC WITH DIFFERENTIAL (CANCER CENTER ONLY)
BASO#: 0.1 10*3/uL (ref 0.0–0.2)
BASO%: 0.8 % (ref 0.0–2.0)
EOS%: 2.8 % (ref 0.0–7.0)
Eosinophils Absolute: 0.2 10*3/uL (ref 0.0–0.5)
HCT: 35.8 % (ref 34.8–46.6)
HGB: 11.9 g/dL (ref 11.6–15.9)
LYMPH#: 1.8 10*3/uL (ref 0.9–3.3)
LYMPH%: 29.2 % (ref 14.0–48.0)
MCH: 31.1 pg (ref 26.0–34.0)
MCHC: 33.2 g/dL (ref 32.0–36.0)
MCV: 94 fL (ref 81–101)
MONO#: 0.6 10*3/uL (ref 0.1–0.9)
MONO%: 10 % (ref 0.0–13.0)
NEUT#: 3.5 10*3/uL (ref 1.5–6.5)
NEUT%: 57.2 % (ref 39.6–80.0)
PLATELETS: 220 10*3/uL (ref 145–400)
RBC: 3.83 10*6/uL (ref 3.70–5.32)
RDW: 12.7 % (ref 11.1–15.7)
WBC: 6.1 10*3/uL (ref 3.9–10.0)

## 2014-07-01 LAB — PROTIME-INR (CHCC SATELLITE)
INR: 2.4 (ref 2.0–3.5)
Protime: 28.8 Seconds — ABNORMAL HIGH (ref 10.6–13.4)

## 2014-07-01 LAB — RETICULOCYTES (CHCC)
ABS RETIC: 46.8 10*3/uL (ref 19.0–186.0)
RBC.: 3.9 MIL/uL (ref 3.87–5.11)
RETIC CT PCT: 1.2 % (ref 0.4–2.3)

## 2014-07-01 LAB — IRON AND TIBC CHCC
%SAT: 43 % (ref 21–57)
Iron: 110 ug/dL (ref 41–142)
TIBC: 253 ug/dL (ref 236–444)
UIBC: 143 ug/dL (ref 120–384)

## 2014-07-01 LAB — FERRITIN CHCC

## 2014-07-01 NOTE — Telephone Encounter (Signed)
RF request for trazadone.  Last Rx was 12/28/13 x 6 rfs.  Please advise.

## 2014-07-03 NOTE — Telephone Encounter (Signed)
OK to RF as previously rx'd, RF x 6.-thx

## 2014-07-04 MED ORDER — TRAZODONE HCL 50 MG PO TABS: ORAL_TABLET | ORAL | Status: DC

## 2014-07-04 NOTE — Telephone Encounter (Signed)
Rx sent 

## 2014-07-06 ENCOUNTER — Telehealth: Payer: Self-pay | Admitting: *Deleted

## 2014-07-06 ENCOUNTER — Other Ambulatory Visit: Payer: Self-pay | Admitting: *Deleted

## 2014-07-06 DIAGNOSIS — D509 Iron deficiency anemia, unspecified: Secondary | ICD-10-CM

## 2014-07-06 NOTE — Telephone Encounter (Addendum)
Message copied by Lenn Sink on Wed Jul 06, 2014  1:38 PM ------      Message from: Burney Gauze R      Created: Wed Jul 06, 2014  7:20 AM       Call - iron is ok.   INR is perfect.  How much coumadin is she on?  Need to repeat the INR in 7 days.  Please set up.  pete ------Informed pt that INR is perfect. Pt has lab appt for another INR this Friday. Pt states she is taking 10mg  Coumadin

## 2014-07-08 ENCOUNTER — Encounter: Payer: Self-pay | Admitting: Hematology & Oncology

## 2014-07-08 ENCOUNTER — Ambulatory Visit (HOSPITAL_BASED_OUTPATIENT_CLINIC_OR_DEPARTMENT_OTHER): Payer: Medicare Other

## 2014-07-08 ENCOUNTER — Ambulatory Visit (HOSPITAL_BASED_OUTPATIENT_CLINIC_OR_DEPARTMENT_OTHER): Payer: Medicare Other | Admitting: Hematology & Oncology

## 2014-07-08 ENCOUNTER — Other Ambulatory Visit (HOSPITAL_BASED_OUTPATIENT_CLINIC_OR_DEPARTMENT_OTHER): Payer: Medicare Other | Admitting: Lab

## 2014-07-08 VITALS — BP 160/86 | HR 76 | Temp 98.1°F | Resp 16 | Ht 63.0 in | Wt 173.0 lb

## 2014-07-08 DIAGNOSIS — I2699 Other pulmonary embolism without acute cor pulmonale: Secondary | ICD-10-CM | POA: Diagnosis not present

## 2014-07-08 DIAGNOSIS — D510 Vitamin B12 deficiency anemia due to intrinsic factor deficiency: Secondary | ICD-10-CM

## 2014-07-08 DIAGNOSIS — M129 Arthropathy, unspecified: Secondary | ICD-10-CM | POA: Diagnosis not present

## 2014-07-08 DIAGNOSIS — N189 Chronic kidney disease, unspecified: Secondary | ICD-10-CM

## 2014-07-08 DIAGNOSIS — N039 Chronic nephritic syndrome with unspecified morphologic changes: Secondary | ICD-10-CM | POA: Diagnosis not present

## 2014-07-08 DIAGNOSIS — M1990 Unspecified osteoarthritis, unspecified site: Secondary | ICD-10-CM

## 2014-07-08 DIAGNOSIS — D509 Iron deficiency anemia, unspecified: Secondary | ICD-10-CM

## 2014-07-08 DIAGNOSIS — M797 Fibromyalgia: Secondary | ICD-10-CM

## 2014-07-08 DIAGNOSIS — Z86711 Personal history of pulmonary embolism: Secondary | ICD-10-CM

## 2014-07-08 DIAGNOSIS — D631 Anemia in chronic kidney disease: Secondary | ICD-10-CM

## 2014-07-08 DIAGNOSIS — G4733 Obstructive sleep apnea (adult) (pediatric): Secondary | ICD-10-CM

## 2014-07-08 DIAGNOSIS — M2550 Pain in unspecified joint: Secondary | ICD-10-CM | POA: Diagnosis not present

## 2014-07-08 LAB — CBC WITH DIFFERENTIAL (CANCER CENTER ONLY)
BASO#: 0 10*3/uL (ref 0.0–0.2)
BASO%: 0.7 % (ref 0.0–2.0)
EOS ABS: 0.1 10*3/uL (ref 0.0–0.5)
EOS%: 1.5 % (ref 0.0–7.0)
HCT: 38.6 % (ref 34.8–46.6)
HEMOGLOBIN: 12.8 g/dL (ref 11.6–15.9)
LYMPH#: 1.5 10*3/uL (ref 0.9–3.3)
LYMPH%: 24.5 % (ref 14.0–48.0)
MCH: 31 pg (ref 26.0–34.0)
MCHC: 33.2 g/dL (ref 32.0–36.0)
MCV: 94 fL (ref 81–101)
MONO#: 0.5 10*3/uL (ref 0.1–0.9)
MONO%: 8.5 % (ref 0.0–13.0)
NEUT#: 4 10*3/uL (ref 1.5–6.5)
NEUT%: 64.8 % (ref 39.6–80.0)
Platelets: 226 10*3/uL (ref 145–400)
RBC: 4.13 10*6/uL (ref 3.70–5.32)
RDW: 12.3 % (ref 11.1–15.7)
WBC: 6.1 10*3/uL (ref 3.9–10.0)

## 2014-07-08 LAB — PROTIME-INR (CHCC SATELLITE)
INR: 2.8 (ref 2.0–3.5)
Protime: 33.6 Seconds — ABNORMAL HIGH (ref 10.6–13.4)

## 2014-07-08 MED ORDER — CYANOCOBALAMIN 1000 MCG/ML IJ SOLN
INTRAMUSCULAR | Status: AC
  Filled 2014-07-08: qty 1

## 2014-07-08 MED ORDER — OXYCODONE-ACETAMINOPHEN 10-325 MG PO TABS: ORAL_TABLET | ORAL | Status: DC

## 2014-07-08 MED ORDER — METHYLPREDNISOLONE SODIUM SUCC 125 MG IJ SOLR
INTRAMUSCULAR | Status: AC
  Filled 2014-07-08: qty 2

## 2014-07-08 MED ORDER — METHYLPREDNISOLONE SODIUM SUCC 125 MG IJ SOLR
125.0000 mg | Freq: Once | INTRAMUSCULAR | Status: AC
  Administered 2014-07-08: 125 mg via INTRAVENOUS

## 2014-07-08 MED ORDER — CYANOCOBALAMIN 1000 MCG/ML IJ SOLN
1000.0000 ug | Freq: Once | INTRAMUSCULAR | Status: AC
  Administered 2014-07-08: 1000 ug via INTRAMUSCULAR

## 2014-07-08 NOTE — Patient Instructions (Signed)
Methylprednisolone Solution for Injection What is this medicine? METHYLPREDNISOLONE (meth ill pred NISS oh lone) is a corticosteroid. It is commonly used to treat inflammation of the skin, joints, lungs, and other organs. Common conditions treated include asthma, allergies, and arthritis. It is also used for other conditions, such as blood disorders and diseases of the adrenal glands. This medicine may be used for other purposes; ask your health care provider or pharmacist if you have questions. COMMON BRAND NAME(S): A-Methapred, Solu-Medrol What should I tell my health care provider before I take this medicine? They need to know if you have any of these conditions: -cataracts or glaucoma -Cushing's syndrome -heart disease -high blood pressure -infection including tuberculosis -low calcium or potassium levels in the blood -recent surgery -seizures -stomach or intestinal disease, including colitis -threadworms -thyroid problems -an unusual or allergic reaction to methylprednisolone, corticosteroids, benzyl alcohol, other medicines, foods, dyes, or preservatives -pregnant or trying to get pregnant -breast-feeding How should I use this medicine? This medicine is for injection or infusion into a vein. It is also for injection into a muscle. It is given by a health care professional in a hospital or clinic setting. Talk to your pediatrician regarding the use of this medicine in children. While this drug may be prescribed for selected conditions, precautions do apply. Overdosage: If you think you have taken too much of this medicine contact a poison control center or emergency room at once. NOTE: This medicine is only for you. Do not share this medicine with others. What if I miss a dose? This does not apply. What may interact with this medicine? Do not take this medicine with any of the following medications: -mifepristone This medicine may also interact with the following  medications: -aspirin and aspirin-like medicines -cyclosporin -ketoconazole -phenobarbital -phenytoin -rifampin -tacrolimus -troleandomycin -vaccines -warfarin This list may not describe all possible interactions. Give your health care provider a list of all the medicines, herbs, non-prescription drugs, or dietary supplements you use. Also tell them if you smoke, drink alcohol, or use illegal drugs. Some items may interact with your medicine. What should I watch for while using this medicine? Visit your doctor or health care professional for regular checks on your progress. If you are taking this medicine for a long time, carry an identification card with your name and address, the type and dose of your medicine, and your doctor's name and address. The medicine may increase your risk of getting an infection. Stay away from people who are sick. Tell your doctor or health care professional if you are around anyone with measles or chickenpox. You may need to avoid some vaccines. Talk to your health care provider for more information. If you are going to have surgery, tell your doctor or health care professional that you have taken this medicine within the last twelve months. Ask your doctor or health care professional about your diet. You may need to lower the amount of salt you eat. The medicine can increase your blood sugar. If you are a diabetic check with your doctor if you need help adjusting the dose of your diabetic medicine. What side effects may I notice from receiving this medicine? Side effects that you should report to your doctor or health care professional as soon as possible: -allergic reactions like skin rash, itching or hives, swelling of the face, lips, or tongue -bloody or tarry stools -changes in vision -eye pain or bulging eyes -fever, sore throat, sneezing, cough, or other signs of infection, wounds that will   not heal °-increased thirst °-irregular heartbeat °-muscle  cramps °-pain in hips, back, ribs, arms, shoulders, or legs °-swelling of the ankles, feet, hands °-trouble passing urine or change in the amount of urine °-unusual bleeding or bruising °-unusually weak or tired °-weight gain or weight loss °Side effects that usually do not require medical attention (report to your doctor or health care professional if they continue or are bothersome): °-changes in emotions or moods °-constipation or diarrhea °-headache °-irritation at site where injected °-nausea, vomiting °-skin problems, acne, thin and shiny skin °-trouble sleeping °-unusual hair growth on the face or body °This list may not describe all possible side effects. Call your doctor for medical advice about side effects. You may report side effects to FDA at 1-800-FDA-1088. °Where should I keep my medicine? °This drug is given in a hospital or clinic and will not be stored at home. °NOTE: This sheet is a summary. It may not cover all possible information. If you have questions about this medicine, talk to your doctor, pharmacist, or health care provider. °© 2015, [employer]. (2012-09-08 11:37:16) ° ° °

## 2014-07-08 NOTE — Progress Notes (Signed)
Hematology and Oncology Follow Up Visit  Linda [name] [id number] Apr 20, 1946 68 y.o. 07/08/2014   Principle Diagnosis:   Idiopathic pulmonary embolism  [name] with chronic oxygen use  Recurrent iron deficiency anemia  Pernicious anemia  Congestive heart failure  Current Therapy:    IV iron as indicated  Vitamin B12 1 mg IM every month  Coumadin - 2 years of duration     Interim History:  Ms.  [name] is back for followup. She still is having a very hard time with her husbands death and she just is not able to do a lot. She did go down to [place] to be with her daughter for a week. She did have a good time.  She hurts all the time. She has fibromyalgia. She has a chronic back problems. She has seen a specialist. She really doesn't think that they help her. She does have a rheumatologist. She has seen an orthopedic surgeon but he was unable to do much for her for what she says.  She does feel tired. She has no energy. She has a poor appetite.  I don't know how much of all her symptoms are related to depression.  She should not be deficient. Recheck her iron last week. Her ferritin was 2000 with an iron saturation of 43%. Obviously, a lot of the ferritin elevation is an acute phase reactant.  She is on Coumadin. I that she takes 10 mg a day. Her INR was 2.4 last week. Is 2.8 today.  She seems to do okay with vitamin B12.  She really has a tough attitude toward most of her doctors. She says that none of her daughters will spend time with her to try to help her. She is very thankful for our office and our staff for short-term a lot of support and care and compassion.  Medications: Current outpatient prescriptions:albuterol (PROVENTIL HFA;VENTOLIN HFA) 108 (90 BASE) MCG/ACT inhaler, Inhale 2 puffs into the lungs every 6 (six) hours as needed for wheezing or shortness of breath. , Disp: , Rfl: ;  albuterol (PROVENTIL) (2.5 MG/3ML) 0.083% nebulizer solution, Take 3 mLs (2.5  mg total) by nebulization every 6 (six) hours as needed for wheezing or shortness of breath., Disp: 75 mL, Rfl: 1 ALPRAZolam (XANAX) 1 MG tablet, 1/2-1 tab po tid as needed for anxiety or sleep, Disp: 90 tablet, Rfl: 5;  amitriptyline (ELAVIL) 100 MG tablet, TAKE 1 TABLET (100 MG TOTAL) BY MOUTH AT BEDTIME, Disp: 30 tablet, Rfl: 6;  atorvastatin (LIPITOR) 20 MG tablet, Take 1 tablet (20 mg total) by mouth daily at 6 PM., Disp: 30 tablet, Rfl: 6;  BYSTOLIC 5 MG tablet, TAKE 1 TABLET (5 MG TOTAL) BY MOUTH DAILY., Disp: 30 tablet, Rfl: 2 furosemide (LASIX) 20 MG tablet, TAKE 1 TABLET BY MOUTH EVERY DAY, Disp: 30 tablet, Rfl: 3;  isosorbide mononitrate (IMDUR) 30 MG 24 hr tablet, TAKE 1 TABLET (30 MG TOTAL) BY MOUTH DAILY., Disp: 30 tablet, Rfl: 3;  meloxicam (MOBIC) 7.5 MG tablet, Take 7.5 mg by mouth 2 (two) times daily. , Disp: , Rfl: ;  Multiple Vitamins-Minerals (CENTRUM SILVER) tablet, Take 1 tablet by mouth., Disp: , Rfl:  nitroGLYCERIN (NITROSTAT) 0.4 MG SL tablet, Place 1 tablet (0.4 mg total) under the tongue every 5 (five) minutes x 3 doses as needed for chest pain., Disp: 60 tablet, Rfl: 3;  SAVELLA 50 MG TABS tablet, TAKE 1 TABLET (50 MG TOTAL) BY MOUTH 2 (TWO) TIMES DAILY., Disp: 60 tablet, Rfl: 3;  traZODone (DESYREL) 50 MG tablet, Take 2-4 tabs po qhs for insomnia, Disp: 120 tablet, Rfl: 6 oxyCODONE-acetaminophen (PERCOCET) 10-325 MG per tablet, Take 1 pill every 6-8 hrs, IF NEEDED, for pain, Disp: 30 tablet, Rfl: 0;  pregabalin (LYRICA) 300 MG capsule, Take 300 mg by mouth 2 (two) times daily., Disp: , Rfl:   Allergies:  Allergies  Allergen Reactions  . Penicillins Itching, Swelling and Rash    Tolerated Cefepime in ED.    Past Medical History, Surgical history, Social history, and Family History were reviewed and updated.  Review of Systems: As above  Physical Exam:  height is 5\' 3"  (1.6 m) and weight is 173 lb (78.472 kg). Her oral temperature is 98.1 F (36.7 C). Her blood  pressure is 160/86 and her pulse is 76. Her respiration is 16 and oxygen saturation is 97%.   Chronically ill appear white female. She is on oxygen. Head and neck exam shows no ocular or oral lesions. Shows no palpable cervical or supraclavicular lymph nodes. Lungs are clear. She has some crackles in the bases. Cardiac exam regular in rhythm with a normal S1 and S2. There are no murmurs rubs or bruits. Abdomen is soft. Has good bowel sounds. There is no fluid wave. There is no palpable liver or spleen tip. Back exam shows some tenderness over the spine ribs and hips bilaterally. No erythema is noted over the hips. Extremities shows some significant osteoarthritic changes in her joints. She has some slight joint swelling in her hands. She decreased range of motion of some of her joints. Skin exam shows no rashes. Lower Shoney's shows some chronic trace edema.  Lab Results  Component Value Date   WBC 6.1 07/08/2014   HGB 12.8 07/08/2014   HCT 38.6 07/08/2014   MCV 94 07/08/2014   PLT 226 07/08/2014     Chemistry      Component Value Date/Time   NA 141 04/28/2014 0709   K 3.5* 04/28/2014 0709   CL 100 04/28/2014 0709   CO2 27 04/28/2014 0709   BUN 19 04/28/2014 0709   CREATININE 0.73 04/28/2014 0709   CREATININE 0.65 05/27/2013 1706      Component Value Date/Time   CALCIUM 8.8 04/28/2014 0709   ALKPHOS 66 04/23/2014 1800   AST 72* 04/23/2014 1800   ALT 31 04/23/2014 1800   BILITOT 0.3 04/23/2014 1800     INR is 2.8.    Impression and Plan: Linda [name] is 68 year old female with multiple medical problems. Again, she is try to deal with her husband's death. He had a mesothelioma.  We will go ahead and give her vitamin B12 today. I told her that she did not need any iron.  We will going to reduce of Solu-Medrol. This does seem to help with her arthralgias.  Of note, her last CT angiogram of the chest done back in May did not show any evidence of residual pulmonary embolism.  We will plan to go back in about  one month.  I do think that we have to get her back in about 2 weeks for a check of her INR.  I spent about 30 minutes with her today. I just listen to her and tried to reassure her and to give her some comfort and let her no that we will certainly try to help her out. I did go ahead and give her a prescription for Percocet (10/325). I told her that I do this, she want to give any also from  her rheumatologist for pain specialist. She understands this.  Volanda Napoleon, MD 7/17/20156:11 PM

## 2014-07-11 LAB — COMPREHENSIVE METABOLIC PANEL
ALBUMIN: 3.9 g/dL (ref 3.5–5.2)
ALT: 12 U/L (ref 0–35)
AST: 17 U/L (ref 0–37)
Alkaline Phosphatase: 77 U/L (ref 39–117)
BUN: 19 mg/dL (ref 6–23)
CO2: 28 meq/L (ref 19–32)
Calcium: 9.1 mg/dL (ref 8.4–10.5)
Chloride: 105 mEq/L (ref 96–112)
Creatinine, Ser: 1 mg/dL (ref 0.50–1.10)
GLUCOSE: 90 mg/dL (ref 70–99)
POTASSIUM: 4 meq/L (ref 3.5–5.3)
SODIUM: 145 meq/L (ref 135–145)
TOTAL PROTEIN: 6.8 g/dL (ref 6.0–8.3)
Total Bilirubin: 0.4 mg/dL (ref 0.2–1.2)

## 2014-07-11 LAB — IRON AND TIBC CHCC
%SAT: 39 % (ref 21–57)
IRON: 101 ug/dL (ref 41–142)
TIBC: 263 ug/dL (ref 236–444)
UIBC: 161 ug/dL (ref 120–384)

## 2014-07-11 LAB — FERRITIN CHCC: Ferritin: 2068 ng/ml — ABNORMAL HIGH (ref 9–269)

## 2014-07-11 LAB — ERYTHROPOIETIN: ERYTHROPOIETIN: 3.4 m[IU]/mL (ref 2.6–18.5)

## 2014-07-11 LAB — RETICULOCYTES (CHCC)
ABS Retic: 62 10*3/uL (ref 19.0–186.0)
RBC.: 4.13 MIL/uL (ref 3.87–5.11)
Retic Ct Pct: 1.5 % (ref 0.4–2.3)

## 2014-07-13 ENCOUNTER — Encounter: Payer: Self-pay | Admitting: *Deleted

## 2014-07-15 ENCOUNTER — Ambulatory Visit (INDEPENDENT_AMBULATORY_CARE_PROVIDER_SITE_OTHER): Payer: Medicare Other | Admitting: Nurse Practitioner

## 2014-07-15 ENCOUNTER — Encounter: Payer: Self-pay | Admitting: Nurse Practitioner

## 2014-07-15 VITALS — BP 137/77 | HR 105 | Temp 98.4°F | Resp 18

## 2014-07-15 DIAGNOSIS — F411 Generalized anxiety disorder: Secondary | ICD-10-CM | POA: Diagnosis not present

## 2014-07-15 DIAGNOSIS — N10 Acute tubulo-interstitial nephritis: Secondary | ICD-10-CM

## 2014-07-15 LAB — POCT URINALYSIS DIPSTICK
BILIRUBIN UA: NEGATIVE
GLUCOSE UA: NEGATIVE
Ketones, UA: NEGATIVE
Nitrite, UA: POSITIVE
Protein, UA: NEGATIVE
SPEC GRAV UA: 1.015
UROBILINOGEN UA: 0.2
pH, UA: 6

## 2014-07-15 MED ORDER — ALPRAZOLAM 1 MG PO TABS: ORAL_TABLET | ORAL | Status: DC

## 2014-07-15 MED ORDER — PHENAZOPYRIDINE HCL 200 MG PO TABS: 200.0000 mg | ORAL_TABLET | Freq: Three times a day (TID) | ORAL | Status: DC | PRN

## 2014-07-15 MED ORDER — CIPROFLOXACIN HCL 500 MG PO TABS: 500.0000 mg | ORAL_TABLET | Freq: Two times a day (BID) | ORAL | Status: DC

## 2014-07-15 NOTE — Patient Instructions (Signed)
Start antibiotic. Our office will call if we need to change the antibiotic. Take pyridium to relax bladder, caution: urine tears & sweat will be orange. Do not be alarmed! Sip hydrating fluids (water, juice, colorless soda, decaff tea) every hour to flush kidneys. Return in 1 month to recheck urine or sooner if symptoms do not improve or you feel worse.  Urinary Tract Infection Urinary tract infections (UTIs) can develop anywhere along your urinary tract. Your urinary tract is your body's drainage system for removing wastes and extra water. Your urinary tract includes two kidneys, two ureters, a bladder, and a urethra. Your kidneys are a pair of bean-shaped organs. Each kidney is about the size of your fist. They are located below your ribs, one on each side of your spine. CAUSES Infections are caused by microbes, which are microscopic organisms, including fungi, viruses, and bacteria. These organisms are so small that they can only be seen through a microscope. Bacteria are the microbes that most commonly cause UTIs. SYMPTOMS  Symptoms of UTIs may vary by age and gender of the patient and by the location of the infection. Symptoms in young women typically include a frequent and intense urge to urinate and a painful, burning feeling in the bladder or urethra during urination. Older women and men are more likely to be tired, shaky, and weak and have muscle aches and abdominal pain. A fever may mean the infection is in your kidneys. Other symptoms of a kidney infection include pain in your back or sides below the ribs, nausea, and vomiting. DIAGNOSIS To diagnose a UTI, your caregiver will ask you about your symptoms. Your caregiver also will ask to provide a urine sample. The urine sample will be tested for bacteria and white blood cells. White blood cells are made by your body to help fight infection. TREATMENT  Typically, UTIs can be treated with medication. Because most UTIs are caused by a bacterial  infection, they usually can be treated with the use of antibiotics. The choice of antibiotic and length of treatment depend on your symptoms and the type of bacteria causing your infection. HOME CARE INSTRUCTIONS  If you were prescribed antibiotics, take them exactly as your caregiver instructs you. Finish the medication even if you feel better after you have only taken some of the medication.  Drink enough water and fluids to keep your urine clear or pale yellow.  Avoid caffeine, tea, and carbonated beverages. They tend to irritate your bladder.  Empty your bladder often. Avoid holding urine for long periods of time.  Empty your bladder before and after sexual intercourse.  After a bowel movement, women should cleanse from front to back. Use each tissue only once. SEEK MEDICAL CARE IF:   You have back pain.  You develop a fever.  Your symptoms do not begin to resolve within 3 days. SEEK IMMEDIATE MEDICAL CARE IF:   You have severe back pain or lower abdominal pain.  You develop chills.  You have nausea or vomiting.  You have continued burning or discomfort with urination. MAKE SURE YOU:   Understand these instructions.  Will watch your condition.  Will get help right away if you are not doing well or get worse. Document Released: 09/18/2005 Document Revised: 06/09/2012 Document Reviewed: 01/17/2012 [employer] Patient Information 2014 [employer].  

## 2014-07-15 NOTE — Progress Notes (Signed)
   Subjective:    Patient ID: Linda [name], female    DOB: Oct 14, 1946, 68 y.o.   MRN: [id number]  HPI Comments: Recently lost husband. Asked for refill on ativan.  Dysuria  This is a new problem. The current episode started in the past 7 days (4d). The problem occurs every urination. The problem has been gradually worsening. The quality of the pain is described as burning. The pain is mild. There has been no fever. She is not sexually active. There is a history of pyelonephritis. Associated symptoms include flank pain, frequency, nausea and urgency. Pertinent negatives include no chills, discharge, hematuria, hesitancy, sweats or vomiting. She has tried nothing for the symptoms.      Review of Systems  Constitutional: Negative for fever and chills.  Gastrointestinal: Positive for nausea. Negative for vomiting.  Genitourinary: Positive for dysuria, urgency, frequency and flank pain. Negative for hesitancy, hematuria and difficulty urinating.  Musculoskeletal: Positive for back pain.       Objective:   Physical Exam  Vitals reviewed. Constitutional: She is oriented to person, place, and time. She appears well-developed and well-nourished.  HENT:  Head: Normocephalic and atraumatic.  Eyes: Conjunctivae are normal. Right eye exhibits no discharge. Left eye exhibits no discharge.  Cardiovascular: Normal rate.   Pulmonary/Chest: Effort normal and breath sounds normal. No respiratory distress.  Abdominal: Soft. Bowel sounds are normal. She exhibits no distension and no mass. There is tenderness (spurapubic & umbilical). There is no rebound and no guarding.  Musculoskeletal: She exhibits tenderness (RCVA tenderness).  Neurological: She is alert and oriented to person, place, and time.  Skin: Skin is warm and dry.  Psychiatric: She has a normal mood and affect. Her behavior is normal. Thought content normal.         Assessment & Plan:  1. Anxiety state, unspecified - ALPRAZolam  (XANAX) 1 MG tablet; 1/2-1 tab po tid as needed for anxiety or sleep  Dispense: 90 tablet; Refill: 0  2. Acute pyelonephritis - ciprofloxacin (CIPRO) 500 MG tablet; Take 1 tablet (500 mg total) by mouth 2 (two) times daily.  Dispense: 10 tablet; Refill: 0 - phenazopyridine (PYRIDIUM) 200 MG tablet; Take 1 tablet (200 mg total) by mouth 3 (three) times daily as needed for pain.  Dispense: 6 tablet; Refill: 0 - POCT urinalysis dipstick-pos nites, leuks, blood - Urine culture-pending

## 2014-07-17 ENCOUNTER — Emergency Department (HOSPITAL_COMMUNITY): Payer: Medicare Other

## 2014-07-17 ENCOUNTER — Encounter (HOSPITAL_COMMUNITY): Payer: Self-pay | Admitting: Emergency Medicine

## 2014-07-17 ENCOUNTER — Inpatient Hospital Stay (HOSPITAL_COMMUNITY)
Admission: EM | Admit: 2014-07-17 | Discharge: 2014-07-26 | DRG: 378 | Disposition: A | Discharge status: Rehabilitation Facility | Payer: Medicare Other | Attending: Internal Medicine | Admitting: Internal Medicine

## 2014-07-17 DIAGNOSIS — D509 Iron deficiency anemia, unspecified: Secondary | ICD-10-CM | POA: Diagnosis present

## 2014-07-17 DIAGNOSIS — F411 Generalized anxiety disorder: Secondary | ICD-10-CM | POA: Diagnosis present

## 2014-07-17 DIAGNOSIS — Z86711 Personal history of pulmonary embolism: Secondary | ICD-10-CM | POA: Diagnosis not present

## 2014-07-17 DIAGNOSIS — K922 Gastrointestinal hemorrhage, unspecified: Secondary | ICD-10-CM

## 2014-07-17 DIAGNOSIS — Z77090 Contact with and (suspected) exposure to asbestos: Secondary | ICD-10-CM

## 2014-07-17 DIAGNOSIS — G4700 Insomnia, unspecified: Secondary | ICD-10-CM | POA: Diagnosis present

## 2014-07-17 DIAGNOSIS — N390 Urinary tract infection, site not specified: Secondary | ICD-10-CM | POA: Diagnosis present

## 2014-07-17 DIAGNOSIS — E119 Type 2 diabetes mellitus without complications: Secondary | ICD-10-CM | POA: Diagnosis not present

## 2014-07-17 DIAGNOSIS — D62 Acute posthemorrhagic anemia: Secondary | ICD-10-CM | POA: Diagnosis not present

## 2014-07-17 DIAGNOSIS — J9610 Chronic respiratory failure, unspecified whether with hypoxia or hypercapnia: Secondary | ICD-10-CM | POA: Diagnosis not present

## 2014-07-17 DIAGNOSIS — I2699 Other pulmonary embolism without acute cor pulmonale: Secondary | ICD-10-CM | POA: Diagnosis not present

## 2014-07-17 DIAGNOSIS — M797 Fibromyalgia: Secondary | ICD-10-CM

## 2014-07-17 DIAGNOSIS — Z515 Encounter for palliative care: Secondary | ICD-10-CM | POA: Diagnosis not present

## 2014-07-17 DIAGNOSIS — E7800 Pure hypercholesterolemia, unspecified: Secondary | ICD-10-CM | POA: Diagnosis present

## 2014-07-17 DIAGNOSIS — I10 Essential (primary) hypertension: Secondary | ICD-10-CM | POA: Diagnosis present

## 2014-07-17 DIAGNOSIS — I509 Heart failure, unspecified: Secondary | ICD-10-CM | POA: Diagnosis present

## 2014-07-17 DIAGNOSIS — F3289 Other specified depressive episodes: Secondary | ICD-10-CM | POA: Diagnosis not present

## 2014-07-17 DIAGNOSIS — I5042 Chronic combined systolic (congestive) and diastolic (congestive) heart failure: Secondary | ICD-10-CM | POA: Diagnosis present

## 2014-07-17 DIAGNOSIS — Z9981 Dependence on supplemental oxygen: Secondary | ICD-10-CM | POA: Diagnosis not present

## 2014-07-17 DIAGNOSIS — Z8261 Family history of arthritis: Secondary | ICD-10-CM

## 2014-07-17 DIAGNOSIS — K2991 Gastroduodenitis, unspecified, with bleeding: Secondary | ICD-10-CM | POA: Diagnosis not present

## 2014-07-17 DIAGNOSIS — F32A Depression, unspecified: Secondary | ICD-10-CM

## 2014-07-17 DIAGNOSIS — Z880 Allergy status to penicillin: Secondary | ICD-10-CM | POA: Diagnosis not present

## 2014-07-17 DIAGNOSIS — R791 Abnormal coagulation profile: Secondary | ICD-10-CM | POA: Diagnosis not present

## 2014-07-17 DIAGNOSIS — D649 Anemia, unspecified: Secondary | ICD-10-CM | POA: Diagnosis not present

## 2014-07-17 DIAGNOSIS — Z7982 Long term (current) use of aspirin: Secondary | ICD-10-CM | POA: Diagnosis not present

## 2014-07-17 DIAGNOSIS — R55 Syncope and collapse: Secondary | ICD-10-CM

## 2014-07-17 DIAGNOSIS — J4489 Other specified chronic obstructive pulmonary disease: Secondary | ICD-10-CM | POA: Diagnosis present

## 2014-07-17 DIAGNOSIS — F4321 Adjustment disorder with depressed mood: Secondary | ICD-10-CM | POA: Diagnosis not present

## 2014-07-17 DIAGNOSIS — Z841 Family history of disorders of kidney and ureter: Secondary | ICD-10-CM

## 2014-07-17 DIAGNOSIS — Z823 Family history of stroke: Secondary | ICD-10-CM

## 2014-07-17 DIAGNOSIS — D510 Vitamin B12 deficiency anemia due to intrinsic factor deficiency: Secondary | ICD-10-CM | POA: Diagnosis present

## 2014-07-17 DIAGNOSIS — Z6834 Body mass index (BMI) 34.0-34.9, adult: Secondary | ICD-10-CM

## 2014-07-17 DIAGNOSIS — I2510 Atherosclerotic heart disease of native coronary artery without angina pectoris: Secondary | ICD-10-CM | POA: Diagnosis present

## 2014-07-17 DIAGNOSIS — Z9089 Acquired absence of other organs: Secondary | ICD-10-CM

## 2014-07-17 DIAGNOSIS — R110 Nausea: Secondary | ICD-10-CM | POA: Diagnosis not present

## 2014-07-17 DIAGNOSIS — K3189 Other diseases of stomach and duodenum: Secondary | ICD-10-CM

## 2014-07-17 DIAGNOSIS — R091 Pleurisy: Secondary | ICD-10-CM | POA: Diagnosis present

## 2014-07-17 DIAGNOSIS — K319 Disease of stomach and duodenum, unspecified: Secondary | ICD-10-CM | POA: Diagnosis not present

## 2014-07-17 DIAGNOSIS — R269 Unspecified abnormalities of gait and mobility: Secondary | ICD-10-CM | POA: Diagnosis not present

## 2014-07-17 DIAGNOSIS — G35 Multiple sclerosis: Secondary | ICD-10-CM | POA: Diagnosis present

## 2014-07-17 DIAGNOSIS — R040 Epistaxis: Secondary | ICD-10-CM | POA: Diagnosis not present

## 2014-07-17 DIAGNOSIS — Z5189 Encounter for other specified aftercare: Secondary | ICD-10-CM | POA: Diagnosis not present

## 2014-07-17 DIAGNOSIS — J449 Chronic obstructive pulmonary disease, unspecified: Secondary | ICD-10-CM | POA: Diagnosis present

## 2014-07-17 DIAGNOSIS — N3946 Mixed incontinence: Secondary | ICD-10-CM | POA: Diagnosis present

## 2014-07-17 DIAGNOSIS — Z7901 Long term (current) use of anticoagulants: Secondary | ICD-10-CM

## 2014-07-17 DIAGNOSIS — G8929 Other chronic pain: Secondary | ICD-10-CM | POA: Diagnosis present

## 2014-07-17 DIAGNOSIS — J9600 Acute respiratory failure, unspecified whether with hypoxia or hypercapnia: Secondary | ICD-10-CM | POA: Diagnosis not present

## 2014-07-17 DIAGNOSIS — K2971 Gastritis, unspecified, with bleeding: Secondary | ICD-10-CM | POA: Diagnosis not present

## 2014-07-17 DIAGNOSIS — IMO0001 Reserved for inherently not codable concepts without codable children: Secondary | ICD-10-CM | POA: Diagnosis present

## 2014-07-17 DIAGNOSIS — IMO0002 Reserved for concepts with insufficient information to code with codable children: Secondary | ICD-10-CM | POA: Diagnosis not present

## 2014-07-17 DIAGNOSIS — T45515A Adverse effect of anticoagulants, initial encounter: Secondary | ICD-10-CM | POA: Diagnosis present

## 2014-07-17 DIAGNOSIS — J61 Pneumoconiosis due to asbestos and other mineral fibers: Secondary | ICD-10-CM

## 2014-07-17 DIAGNOSIS — I2782 Chronic pulmonary embolism: Secondary | ICD-10-CM | POA: Diagnosis not present

## 2014-07-17 DIAGNOSIS — Z8249 Family history of ischemic heart disease and other diseases of the circulatory system: Secondary | ICD-10-CM | POA: Diagnosis not present

## 2014-07-17 DIAGNOSIS — Z79899 Other long term (current) drug therapy: Secondary | ICD-10-CM | POA: Diagnosis not present

## 2014-07-17 DIAGNOSIS — Z833 Family history of diabetes mellitus: Secondary | ICD-10-CM | POA: Diagnosis not present

## 2014-07-17 DIAGNOSIS — R404 Transient alteration of awareness: Secondary | ICD-10-CM | POA: Diagnosis not present

## 2014-07-17 DIAGNOSIS — J920 Pleural plaque with presence of asbestos: Secondary | ICD-10-CM | POA: Diagnosis present

## 2014-07-17 DIAGNOSIS — Z9989 Dependence on other enabling machines and devices: Secondary | ICD-10-CM

## 2014-07-17 DIAGNOSIS — I951 Orthostatic hypotension: Secondary | ICD-10-CM | POA: Diagnosis present

## 2014-07-17 DIAGNOSIS — I4510 Unspecified right bundle-branch block: Secondary | ICD-10-CM | POA: Diagnosis present

## 2014-07-17 DIAGNOSIS — D696 Thrombocytopenia, unspecified: Secondary | ICD-10-CM | POA: Diagnosis not present

## 2014-07-17 DIAGNOSIS — A419 Sepsis, unspecified organism: Secondary | ICD-10-CM | POA: Diagnosis not present

## 2014-07-17 DIAGNOSIS — F329 Major depressive disorder, single episode, unspecified: Secondary | ICD-10-CM | POA: Diagnosis not present

## 2014-07-17 DIAGNOSIS — G4733 Obstructive sleep apnea (adult) (pediatric): Secondary | ICD-10-CM | POA: Diagnosis present

## 2014-07-17 DIAGNOSIS — M5137 Other intervertebral disc degeneration, lumbosacral region: Secondary | ICD-10-CM | POA: Diagnosis not present

## 2014-07-17 DIAGNOSIS — M47817 Spondylosis without myelopathy or radiculopathy, lumbosacral region: Secondary | ICD-10-CM | POA: Diagnosis not present

## 2014-07-17 DIAGNOSIS — M25559 Pain in unspecified hip: Secondary | ICD-10-CM | POA: Diagnosis not present

## 2014-07-17 DIAGNOSIS — F332 Major depressive disorder, recurrent severe without psychotic features: Secondary | ICD-10-CM | POA: Diagnosis not present

## 2014-07-17 DIAGNOSIS — I5032 Chronic diastolic (congestive) heart failure: Secondary | ICD-10-CM | POA: Diagnosis not present

## 2014-07-17 DIAGNOSIS — R5381 Other malaise: Secondary | ICD-10-CM | POA: Diagnosis not present

## 2014-07-17 HISTORY — DX: Reserved for concepts with insufficient information to code with codable children: IMO0002

## 2014-07-17 LAB — CBC WITH DIFFERENTIAL/PLATELET
BASOS PCT: 1 % (ref 0–1)
Basophils Absolute: 0 10*3/uL (ref 0.0–0.1)
EOS ABS: 0.1 10*3/uL (ref 0.0–0.7)
EOS PCT: 2 % (ref 0–5)
HEMATOCRIT: 15.5 % — AB (ref 36.0–46.0)
HEMOGLOBIN: 5.1 g/dL — AB (ref 12.0–15.0)
Lymphocytes Relative: 33 % (ref 12–46)
Lymphs Abs: 2.1 10*3/uL (ref 0.7–4.0)
MCH: 31.1 pg (ref 26.0–34.0)
MCHC: 32.9 g/dL (ref 30.0–36.0)
MCV: 94.5 fL (ref 78.0–100.0)
MONO ABS: 0.5 10*3/uL (ref 0.1–1.0)
Monocytes Relative: 8 % (ref 3–12)
Neutro Abs: 3.6 10*3/uL (ref 1.7–7.7)
Neutrophils Relative %: 57 % (ref 43–77)
Platelets: 128 10*3/uL — ABNORMAL LOW (ref 150–400)
RBC: 1.64 MIL/uL — ABNORMAL LOW (ref 3.87–5.11)
RDW: 13.8 % (ref 11.5–15.5)
WBC: 6.4 10*3/uL (ref 4.0–10.5)

## 2014-07-17 LAB — URINE CULTURE

## 2014-07-17 LAB — URINALYSIS, ROUTINE W REFLEX MICROSCOPIC
Bilirubin Urine: NEGATIVE
GLUCOSE, UA: NEGATIVE mg/dL
Hgb urine dipstick: NEGATIVE
KETONES UR: NEGATIVE mg/dL
Leukocytes, UA: NEGATIVE
NITRITE: NEGATIVE
PH: 6 (ref 5.0–8.0)
Protein, ur: NEGATIVE mg/dL
SPECIFIC GRAVITY, URINE: 1.014 (ref 1.005–1.030)
Urobilinogen, UA: 0.2 mg/dL (ref 0.0–1.0)

## 2014-07-17 LAB — COMPREHENSIVE METABOLIC PANEL
ALBUMIN: 2.7 g/dL — AB (ref 3.5–5.2)
ALT: 13 U/L (ref 0–35)
ANION GAP: 13 (ref 5–15)
AST: 18 U/L (ref 0–37)
Alkaline Phosphatase: 50 U/L (ref 39–117)
BUN: 25 mg/dL — AB (ref 6–23)
CO2: 24 mEq/L (ref 19–32)
CREATININE: 0.91 mg/dL (ref 0.50–1.10)
Calcium: 7.5 mg/dL — ABNORMAL LOW (ref 8.4–10.5)
Chloride: 106 mEq/L (ref 96–112)
GFR calc Af Amer: 73 mL/min — ABNORMAL LOW (ref >90)
GFR calc non Af Amer: 63 mL/min — ABNORMAL LOW (ref >90)
Glucose, Bld: 86 mg/dL (ref 70–99)
Potassium: 3.8 mEq/L (ref 3.7–5.3)
Sodium: 143 mEq/L (ref 137–147)
Total Bilirubin: <0.2 mg/dL — ABNORMAL LOW (ref 0.3–1.2)
Total Protein: 5 g/dL — ABNORMAL LOW (ref 6.0–8.3)

## 2014-07-17 LAB — CBC
HCT: 17.6 % — ABNORMAL LOW (ref 36.0–46.0)
HEMOGLOBIN: 5.8 g/dL — AB (ref 12.0–15.0)
MCH: 31.5 pg (ref 26.0–34.0)
MCHC: 33 g/dL (ref 30.0–36.0)
MCV: 95.7 fL (ref 78.0–100.0)
PLATELETS: 139 10*3/uL — AB (ref 150–400)
RBC: 1.84 MIL/uL — AB (ref 3.87–5.11)
RDW: 13.8 % (ref 11.5–15.5)
WBC: 5.3 10*3/uL (ref 4.0–10.5)

## 2014-07-17 LAB — LACTATE DEHYDROGENASE: LDH: 258 U/L — AB (ref 94–250)

## 2014-07-17 LAB — I-STAT TROPONIN, ED: TROPONIN I, POC: 0 ng/mL (ref 0.00–0.08)

## 2014-07-17 LAB — POC OCCULT BLOOD, ED: Fecal Occult Bld: POSITIVE — AB

## 2014-07-17 LAB — PREPARE RBC (CROSSMATCH)

## 2014-07-17 MED ORDER — PANTOPRAZOLE SODIUM 40 MG IV SOLR: 40.0000 mg | Freq: Two times a day (BID) | INTRAVENOUS | Status: DC

## 2014-07-17 MED ORDER — SODIUM CHLORIDE 0.9 % IV SOLN
INTRAVENOUS | Status: AC
  Administered 2014-07-17: 23:00:00 via INTRAVENOUS

## 2014-07-17 MED ORDER — ACETAMINOPHEN 325 MG PO TABS: 650.0000 mg | ORAL_TABLET | Freq: Four times a day (QID) | ORAL | Status: DC | PRN

## 2014-07-17 MED ORDER — FENTANYL CITRATE 0.05 MG/ML IJ SOLN
25.0000 ug | INTRAMUSCULAR | Status: DC | PRN
Start: 2014-07-17 — End: 2014-07-18
  Administered 2014-07-17 – 2014-07-18 (×4): 25 ug via INTRAVENOUS
  Filled 2014-07-17 (×5): qty 2

## 2014-07-17 MED ORDER — ALBUTEROL SULFATE HFA 108 (90 BASE) MCG/ACT IN AERS: 2.0000 | INHALATION_SPRAY | Freq: Four times a day (QID) | RESPIRATORY_TRACT | Status: DC | PRN

## 2014-07-17 MED ORDER — NITROGLYCERIN 0.4 MG SL SUBL: 0.4000 mg | SUBLINGUAL_TABLET | SUBLINGUAL | Status: DC | PRN

## 2014-07-17 MED ORDER — ONDANSETRON HCL 4 MG/2ML IJ SOLN
4.0000 mg | Freq: Four times a day (QID) | INTRAMUSCULAR | Status: DC | PRN
  Administered 2014-07-18 – 2014-07-25 (×5): 4 mg via INTRAVENOUS
  Filled 2014-07-17 (×5): qty 2

## 2014-07-17 MED ORDER — ALBUTEROL SULFATE (2.5 MG/3ML) 0.083% IN NEBU: 2.5000 mg | INHALATION_SOLUTION | Freq: Four times a day (QID) | RESPIRATORY_TRACT | Status: DC | PRN

## 2014-07-17 MED ORDER — ACETAMINOPHEN 650 MG RE SUPP: 650.0000 mg | Freq: Four times a day (QID) | RECTAL | Status: DC | PRN

## 2014-07-17 MED ORDER — SODIUM CHLORIDE 0.9 % IV SOLN
80.0000 mg | Freq: Once | INTRAVENOUS | Status: AC
  Administered 2014-07-17: 80 mg via INTRAVENOUS
  Filled 2014-07-17: qty 80

## 2014-07-17 MED ORDER — SODIUM CHLORIDE 0.9 % IV SOLN
8.0000 mg/h | INTRAVENOUS | Status: DC
  Administered 2014-07-17: 8 mg/h via INTRAVENOUS
  Filled 2014-07-17 (×4): qty 80

## 2014-07-17 MED ORDER — VITAMIN K1 10 MG/ML IJ SOLN
5.0000 mg | Freq: Once | INTRAVENOUS | Status: AC
  Administered 2014-07-18: 5 mg via INTRAVENOUS
  Filled 2014-07-17: qty 0.5

## 2014-07-17 MED ORDER — ONDANSETRON HCL 4 MG PO TABS: 4.0000 mg | ORAL_TABLET | Freq: Four times a day (QID) | ORAL | Status: DC | PRN

## 2014-07-17 NOTE — ED Notes (Signed)
Lab called during transport with critical lab value for PT/INR. Floor nurse on 3S (Tim, RN) notified of results and stated he will contact doctor.

## 2014-07-17 NOTE — ED Notes (Addendum)
Per EMS pt had syncopal episode 3 times today. Pt sts she hit her head on first syncopal episode. No abrasion/swelling noted. Pt sts her BP has been running low. EMS reported BP 94/58 post 600 ml bolus. Pt reports having small nose bleed yesterday. Pt sts she has a kidney infection and has been on antibiotics since Friday but sts she did not take antibiotic today. Pt sts she gets dizzy when she stands up since around 1200 yesterday and c/o nausea but denies vomiting.

## 2014-07-17 NOTE — H&P (Addendum)
[facility] History and Physical  [name]:[id number] DOB: 1946/12/01 DOA: 07/17/2014  Referring physician: ER physician. PCP: Tammi Sou, MD  Chief Complaint: Falls and loss of consciousness.  HPI: Linda [name] is a 68 y.o. female with history of pulmonary embolism last year, hypertension, nonobstructive CAD, fibromyalgia, bronchial asthma, asbestosis was brought to the ER after patient had at least 3 episodes of syncope over the last 24 hours. Patient was recently diagnosed with urinary tract infection last week and was placed on Cipro and has been taking ciprofloxacin last 3 days. Patient started noticing some epistaxis over the last 2 days then increasing bruises and ecchymosis. Patient became very orthostatic with patient feeling dizzy on standing up and had lost consciousness briefly at least 3 episodes over the last 24 hours. Patient's family try to bring patient to the hospital but had called the EMS because patient was feeling very dizzy. In the ER patient was found to be orthostatic. Stool for occult blood was positive and patient's blood work showed hemoglobin of around 5 which was confirmed by repeat test. Patient states that she did notice some black stools yesterday. Patient is on Coumadin Plavix and meloxicam. Patient will be admitted for further management of acute GI bleed. I have discussed with on-call gastroenterologist Dr. Benson Norway, who will be seeing patient in consult. Patient's INR is still pending. Patient's blood work also show mild thrombocytopenia. Patient otherwise denies any chest pain or shortness of breath or any focal deficits. Patient does have low back pain which patient states is more than her usual pain. CT head is showing some nonspecific changes in the petrous bone which will eventually require MRI brain.  Review of Systems: As presented in the history of presenting illness, rest negative.   Past Surgical History  Procedure Laterality  Date  . Appendectomy  1960  . Total abdominal hysterectomy  1974  . Tendon release  1996    Right forearm and hand  . Knee surgery  2005  . Heel spur surgery Left 2008  . Plantar fascia release Left 2008  . Axillary surgery Left 1978    Multiple "lump" in armpit per pt  . Coccyx removal  1972  . Cardiac catheterization  01/2013    nonobstructive CAD, EF 55-60%  . Transthoracic echocardiogram  01/2013; 04/2014    2014--NORMAL.  2015--focal basal septal hypertrophy, EF 55-60%, grade I diast dysfxn, mild LAE.    . Dilation and curettage of uterus  ? 1970's  . Eye surgery Left 2012-2013    "injections for ~ 1 yr; don't really know what for" (07/12/2013)  . Spirometry  04/25/14    In hosp for acute asthma/COPD flare: mixed obstructive and restrictive lung disease. The FEV1 is severely reduced at 45% predicted.  FEV1 signif decreased compared to prior spirometry 07/23/13.   Social History:  reports that she has never smoked. She has never used smokeless tobacco. She reports that she does not drink alcohol or use illicit drugs. Where does patient live home. Can patient participate in ADLs? Yes.  Allergies  Allergen Reactions  . Penicillins Itching, Swelling and Rash    Tolerated Cefepime in ED.    Family History:  Family History  Problem Relation Age of Onset  . Arthritis Mother   . Kidney disease Mother   . Heart disease Father   . Stroke Father   . Hypertension Father   . Diabetes Father     Prior to Admission medications   Medication Sig  Start Date End Date Taking? Authorizing Provider  albuterol (PROVENTIL HFA;VENTOLIN HFA) 108 (90 BASE) MCG/ACT inhaler Inhale 2 puffs into the lungs every 6 (six) hours as needed for wheezing or shortness of breath.    Yes Historical Provider, MD  albuterol (PROVENTIL) (2.5 MG/3ML) 0.083% nebulizer solution Take 3 mLs (2.5 mg total) by nebulization every 6 (six) hours as needed for wheezing or shortness of breath. 02/11/14  Yes Tammi Sou, MD   ALPRAZolam Duanne Moron) 1 MG tablet Take 0.5-1 mg by mouth 3 (three) times daily as needed for anxiety or sleep.   Yes Historical Provider, MD  amitriptyline (ELAVIL) 100 MG tablet Take 100 mg by mouth at bedtime.   Yes Historical Provider, MD  atorvastatin (LIPITOR) 20 MG tablet Take 1 tablet (20 mg total) by mouth daily at 6 PM. 08/30/13  Yes Tammi Sou, MD  ciprofloxacin (CIPRO) 500 MG tablet Take 1 tablet (500 mg total) by mouth 2 (two) times daily. 07/15/14  Yes Irene Pap, NP  clopidogrel (PLAVIX) 75 MG tablet Take 75 mg by mouth daily.   Yes Historical Provider, MD  furosemide (LASIX) 20 MG tablet Take 20 mg by mouth daily.   Yes Historical Provider, MD  isosorbide mononitrate (IMDUR) 30 MG 24 hr tablet Take 30 mg by mouth daily.   Yes Historical Provider, MD  meloxicam (MOBIC) 7.5 MG tablet Take 7.5 mg by mouth 2 (two) times daily.  09/20/13  Yes Historical Provider, MD  Milnacipran (SAVELLA) 50 MG TABS tablet Take 50 mg by mouth 2 (two) times daily.   Yes Historical Provider, MD  Multiple Vitamins-Minerals (CENTRUM SILVER) tablet Take 1 tablet by mouth daily.    Yes Historical Provider, MD  nebivolol (BYSTOLIC) 5 MG tablet Take 5 mg by mouth daily.   Yes Historical Provider, MD  nitroGLYCERIN (NITROSTAT) 0.4 MG SL tablet Place 1 tablet (0.4 mg total) under the tongue every 5 (five) minutes x 3 doses as needed for chest pain. 07/19/13  Yes Reyne Dumas, MD  phenazopyridine (PYRIDIUM) 200 MG tablet Take 1 tablet (200 mg total) by mouth 3 (three) times daily as needed for pain. 07/15/14  Yes Irene Pap, NP  pregabalin (LYRICA) 200 MG capsule Take 200-400 mg by mouth 3 (three) times daily. Take 1 capsule in the morning and supper and 2 capsule at bedtime   Yes Historical Provider, MD  traZODone (DESYREL) 50 MG tablet Take 100-200 mg by mouth at bedtime as needed (for insomnia).   Yes Historical Provider, MD  warfarin (COUMADIN) 5 MG tablet Take 10 mg by mouth daily.    Yes Historical  Provider, MD    Physical Exam: Filed Vitals:   07/17/14 1845 07/17/14 1849 07/17/14 1945 07/17/14 2030  BP: 91/50 78/45 127/60 111/54  Pulse: 79 81 89 78  Temp:      TempSrc:      Resp: 16 18 16 10   Height:      Weight:      SpO2: 96%  100% 100%     General:  Well developed and nourished.  Eyes: Anicteric no pallor.  ENT: No discharge from the ears eyes nose mouth.  Neck: No mass felt.  Cardiovascular: S1-S2 heard.  Respiratory: No rhonchi or crepitations.  Abdomen: Soft nontender bowel sounds present. No guarding or rigidity.  Skin: Multiple bruises and ecchymosis.  Musculoskeletal: Patient does have low-back pain but able to move all extremities.  Psychiatric: Appears normal.  Neurologic: Alert awake oriented to time place and  person. Moves all extremities 5 x 5.  Labs on Admission:  Basic Metabolic Panel:  Recent Labs Lab 07/17/14 1804  NA 143  K 3.8  CL 106  CO2 24  GLUCOSE 86  BUN 25*  CREATININE 0.91  CALCIUM 7.5*   Liver Function Tests:  Recent Labs Lab 07/17/14 1804  AST 18  ALT 13  ALKPHOS 50  BILITOT <0.2*  PROT 5.0*  ALBUMIN 2.7*   No results found for this basename: LIPASE, AMYLASE,  in the last 168 hours No results found for this basename: AMMONIA,  in the last 168 hours CBC:  Recent Labs Lab 07/17/14 1804 07/17/14 1947  WBC 6.4 5.3  NEUTROABS 3.6  --   HGB 5.1* 5.8*  HCT 15.5* 17.6*  MCV 94.5 95.7  PLT 128* 139*   Cardiac Enzymes: No results found for this basename: CKTOTAL, CKMB, CKMBINDEX, TROPONINI,  in the last 168 hours  BNP (last 3 results)  Recent Labs  09/13/13 1520 04/23/14 1800 04/26/14 0708  PROBNP 262.4* 801.7* 2423.0*   CBG: No results found for this basename: GLUCAP,  in the last 168 hours  Radiological Exams on Admission: Dg Chest 2 View  07/17/2014   CLINICAL DATA:  Loss of consciousness.  EXAM: CHEST  2 VIEW  COMPARISON:  04/27/2014  FINDINGS: Heart size and pulmonary vascularity are  normal. There are extensive pleural calcifications bilaterally, stable. No infiltrates or effusions. No acute osseous abnormality.  IMPRESSION: No acute abnormalities.   Electronically Signed   By: Rozetta Nunnery M.D.   On: 07/17/2014 19:12   Ct Head Wo Contrast  07/17/2014   CLINICAL DATA:  Syncopal episodes with loss of consciousness, dizziness, history of migraines  EXAM: CT HEAD WITHOUT CONTRAST  TECHNIQUE: Contiguous axial images were obtained from the base of the skull through the vertex without intravenous contrast.  COMPARISON:  01/30/2013  FINDINGS: Calvarium is intact. No significant inflammatory change in the visualized portions of the paranasal sinuses.  Mild age-related atrophy. No evidence of hemorrhage or extra-axial fluid. No evidence of infarct or hydrocephalus.  There is a suggestion of an extra-axial mass near the right petrous bone measuring about 13 mm. This appears contiguous however within overlying gyrus and an identical appearance is identified on 12/14/2012.  IMPRESSION: Suggestion of mass arising off the right petrous bone is likely due to volume averaging with a prominent overlying gyrus of right cerebellum, as the appearance is very similar to 12/14/2012. MRI should be considered to confirm this however.   Electronically Signed   By: Skipper Cliche M.D.   On: 07/17/2014 19:55    EKG: Independently reviewed. Normal sinus rhythm with right bundle branch block.  Assessment/Plan Principal Problem:   Acute GI bleeding Active Problems:   Anemia associated with acute blood loss   Syncope   History of pulmonary embolism   GI bleed   1. Acute GI bleed in the setting of patient taking Coumadin Plavix and meloxicam - patient's INR is still pending. At this time I have discussed with on-call gastroenterologist Dr. Benson Norway. 2 units of packed red blood cell transfusion has been ordered. Patient will be placed on Protonix infusion. Patient will be kept n.p.o. in anticipation of EGD and  further workup for bleed. Hold Coumadin Plavix and meloxicam. CT abdomen and pelvis has been ordered to rule out any retroperitoneal hematoma as patient is complaining of significant back pain 2. On Coumadin suspect coagulopathy - INR is pending. 3. Mild thrombocytopenia - closely follow CBC. 4.  History of pulmonary embolism - last year. It was idiopathic unprovoked. Holding anticoagulants due to #1. 5. Acute blood loss anemia - 2 units of packed red blood cell transfusion has been ordered. Follow CBC. 6. History of asthma and asbestosis - presently not wheezing. 7. History of nonobstructive CAD and hypertension - holding off all antihypertensives and antiplatelet agents due to #1. 8. Fibromyalgia chronic pain - discontinued NSAIDs due to #1. Check x-ray of the pelvis and lumbar spine due to back pain. 9. Syncope - due to #1. 10. Abnormal CT head - will need MRI of the brain eventually. 11. Recently treated for urinary tract infection - urinalysis today unremarkable.  Addendum - patient's INR has come more than 10. I have ordered vitamin K 10 mg IV and 2 units of FFP.    Code Status: Full code.  Family Communication: Patient's daughter and son at the bedside.  Disposition Plan: Admit to step down unit as inpatient.    Detra Bores N. [facility] Pager [phone].  If 7PM-7AM, please contact night-coverage [email or web address] Password [facility] 07/17/2014, 9:26 PM

## 2014-07-17 NOTE — ED Provider Notes (Signed)
CSN: [id number]     Arrival date & time 07/17/14  1754 History   First MD Initiated Contact with Patient 07/17/14 1756     Chief Complaint  Patient presents with  . Loss of Consciousness     (Consider location/radiation/quality/duration/timing/severity/associated sxs/prior Treatment) HPI Comments: Patient is a 68 year old female with extensive past medical history including hypertension, pulmonary embolism, urinary tract infection, fibromyalgia. She presents today for evaluation of syncope. She states that every time she stands to walk. She becomes lightheaded and feels as though she is going to pass out. She states she is actually blacked out twice. She denies having herself when she fell and denies having struck her head. She denies any neck pain. She denies any fevers or chills, but was diagnosed with a urinary tract infection by her primary doctor 2 days ago. She's been taking Cipro.  Patient is a 67 y.o. female presenting with syncope. The history is provided by the patient.  Loss of Consciousness Episode history:  Multiple Most recent episode:  Yesterday Timing:  Intermittent Progression:  Worsening Chronicity:  New Witnessed: yes   Relieved by:  Nothing Worsened by:  Posture Ineffective treatments:  None tried Associated symptoms: no anxiety, no chest pain, no headaches and no shortness of breath      Past Surgical History  Procedure Laterality Date  . Appendectomy  1960  . Total abdominal hysterectomy  1974  . Tendon release  1996    Right forearm and hand  . Knee surgery  2005  . Heel spur surgery Left 2008  . Plantar fascia release Left 2008  . Axillary surgery Left 1978    Multiple "lump" in armpit per pt  . Coccyx removal  1972  . Cardiac catheterization  01/2013    nonobstructive CAD, EF 55-60%  . Transthoracic echocardiogram  01/2013; 04/2014    2014--NORMAL.  2015--focal basal septal hypertrophy, EF 55-60%, grade I diast dysfxn, mild LAE.    . Dilation and  curettage of uterus  ? 1970's  . Eye surgery Left 2012-2013    "injections for ~ 1 yr; don't really know what for" (07/12/2013)  . Spirometry  04/25/14    In hosp for acute asthma/COPD flare: mixed obstructive and restrictive lung disease. The FEV1 is severely reduced at 45% predicted.  FEV1 signif decreased compared to prior spirometry 07/23/13.   Family History  Problem Relation Age of Onset  . Arthritis Mother   . Kidney disease Mother   . Heart disease Father   . Stroke Father   . Hypertension Father   . Diabetes Father    History  Substance Use Topics  . Smoking status: Never Smoker   . Smokeless tobacco: Never Used     Comment: never used tobacco  . Alcohol Use: No   OB History   Grav Para Term Preterm Abortions TAB SAB Ect Mult Living                 Review of Systems  Respiratory: Negative for shortness of breath.   Cardiovascular: Positive for syncope. Negative for chest pain.  Neurological: Negative for headaches.  All other systems reviewed and are negative.     Allergies  Penicillins  Home Medications   Prior to Admission medications   Medication Sig Start Date End Date Taking? Authorizing Provider  albuterol (PROVENTIL HFA;VENTOLIN HFA) 108 (90 BASE) MCG/ACT inhaler Inhale 2 puffs into the lungs every 6 (six) hours as needed for wheezing or shortness of breath.  Historical Provider, MD  albuterol (PROVENTIL) (2.5 MG/3ML) 0.083% nebulizer solution Take 3 mLs (2.5 mg total) by nebulization every 6 (six) hours as needed for wheezing or shortness of breath. 02/11/14   Tammi Sou, MD  ALPRAZolam Duanne Moron) 1 MG tablet 1/2-1 tab po tid as needed for anxiety or sleep 07/15/14   Irene Pap, NP  amitriptyline (ELAVIL) 100 MG tablet TAKE 1 TABLET (100 MG TOTAL) BY MOUTH AT BEDTIME 05/09/14   Tammi Sou, MD  atorvastatin (LIPITOR) 20 MG tablet Take 1 tablet (20 mg total) by mouth daily at 6 PM. 08/30/13   Tammi Sou, MD  BYSTOLIC 5 MG tablet TAKE 1 TABLET  (5 MG TOTAL) BY MOUTH DAILY. 05/13/14   Tammi Sou, MD  ciprofloxacin (CIPRO) 500 MG tablet Take 1 tablet (500 mg total) by mouth 2 (two) times daily. 07/15/14   Irene Pap, NP  furosemide (LASIX) 20 MG tablet TAKE 1 TABLET BY MOUTH EVERY DAY 05/09/14   Tammi Sou, MD  isosorbide mononitrate (IMDUR) 30 MG 24 hr tablet TAKE 1 TABLET (30 MG TOTAL) BY MOUTH DAILY. 05/12/14   Tammi Sou, MD  meloxicam (MOBIC) 7.5 MG tablet Take 7.5 mg by mouth 2 (two) times daily.  09/20/13   Historical Provider, MD  Multiple Vitamins-Minerals (CENTRUM SILVER) tablet Take 1 tablet by mouth.    Historical Provider, MD  nitroGLYCERIN (NITROSTAT) 0.4 MG SL tablet Place 1 tablet (0.4 mg total) under the tongue every 5 (five) minutes x 3 doses as needed for chest pain. 07/19/13   Reyne Dumas, MD  oxyCODONE-acetaminophen (PERCOCET) 10-325 MG per tablet Take 1 pill every 6-8 hrs, IF NEEDED, for pain 07/08/14   Volanda Napoleon, MD  phenazopyridine (PYRIDIUM) 200 MG tablet Take 1 tablet (200 mg total) by mouth 3 (three) times daily as needed for pain. 07/15/14   Irene Pap, NP  pregabalin (LYRICA) 300 MG capsule Take 300 mg by mouth 2 (two) times daily.    Historical Provider, MD  SAVELLA 50 MG TABS tablet TAKE 1 TABLET (50 MG TOTAL) BY MOUTH 2 (TWO) TIMES DAILY. 05/13/14   Tammi Sou, MD  traZODone (DESYREL) 50 MG tablet Take 2-4 tabs po qhs for insomnia 07/04/14   Tammi Sou, MD   BP 99/47  Pulse 77  Temp(Src) 98.4 F (36.9 C) (Oral)  Resp 16  Ht 5\' 4"  (1.626 m)  Wt 160 lb (72.576 kg)  BMI 27.45 kg/m2  SpO2 100% Physical Exam  Nursing note and vitals reviewed. Constitutional: She is oriented to person, place, and time. She appears well-developed and well-nourished. No distress.  HENT:  Head: Normocephalic and atraumatic.  Mouth/Throat: Oropharynx is clear and moist.  Eyes: EOM are normal. Pupils are equal, round, and reactive to light.  Neck: Normal range of motion. Neck supple.   Cardiovascular: Normal rate and regular rhythm.  Exam reveals no gallop and no friction rub.   No murmur heard. Pulmonary/Chest: Effort normal and breath sounds normal. No respiratory distress. She has no wheezes.  Abdominal: Soft. Bowel sounds are normal. She exhibits no distension. There is no tenderness.  Musculoskeletal: Normal range of motion. She exhibits no edema.  Neurological: She is alert and oriented to person, place, and time. No cranial nerve deficit. She exhibits normal muscle tone. Coordination normal.  Skin: Skin is warm and dry. She is not diaphoretic.    ED Course  Procedures (including critical care time) Labs Review Labs Reviewed  CBC  WITH DIFFERENTIAL  COMPREHENSIVE METABOLIC PANEL  URINALYSIS, ROUTINE W REFLEX MICROSCOPIC  I-STAT TROPOININ, ED    Imaging Review No results found.   Date: 07/17/2014  Rate: 80   Rhythm: normal sinus rhythm  QRS Axis: normal  Intervals: normal  ST/T Wave abnormalities: nonspecific T wave changes  Conduction Disutrbances:right bundle branch block  Narrative Interpretation:   Old EKG Reviewed: unchanged    MDM   Final diagnoses:  None    Patient is a 68 year old female with history of iron deficiency anemia. She presents today with complaints of recurrent syncope with standing over the past 24 hours. Her vital signs are orthostatic and hemoglobin returned at 5.1. She was given normal saline and medicine has been consulted for admission. Rectal exam reveals heme positive stool, however this is not melanotic or black appearing. She does not report any melanotic stools or any abdominal pain. I am uncertain as to what caused the dramatic decrease in her hemoglobin, however I feel as though she will likely require a transfusion. I am planning to repeat the CBC to ensure this result is indeed accurate.  I've spoken with Dr.Kakrakandy who agrees to admit the patient to his service.    Veryl Speak, MD 07/17/14 2040

## 2014-07-17 NOTE — ED Notes (Signed)
Patient transported to X-ray 

## 2014-07-17 NOTE — ED Notes (Signed)
Dr. Stark Jock notified of critical lab value Hemoglobin 5.1

## 2014-07-18 ENCOUNTER — Other Ambulatory Visit: Payer: Self-pay | Admitting: Family Medicine

## 2014-07-18 ENCOUNTER — Telehealth: Payer: Self-pay | Admitting: Nurse Practitioner

## 2014-07-18 ENCOUNTER — Encounter (HOSPITAL_COMMUNITY): Payer: Self-pay | Admitting: Physician Assistant

## 2014-07-18 ENCOUNTER — Inpatient Hospital Stay (HOSPITAL_COMMUNITY): Payer: Medicare Other

## 2014-07-18 DIAGNOSIS — R040 Epistaxis: Secondary | ICD-10-CM

## 2014-07-18 LAB — PROTIME-INR
INR: 1.26 (ref 0.00–1.49)
Prothrombin Time: 15.8 seconds — ABNORMAL HIGH (ref 11.6–15.2)

## 2014-07-18 LAB — CBC
HEMATOCRIT: 25.2 % — AB (ref 36.0–46.0)
Hemoglobin: 8.2 g/dL — ABNORMAL LOW (ref 12.0–15.0)
MCH: 28.5 pg (ref 26.0–34.0)
MCHC: 32.5 g/dL (ref 30.0–36.0)
MCV: 87.5 fL (ref 78.0–100.0)
Platelets: 154 10*3/uL (ref 150–400)
RBC: 2.88 MIL/uL — ABNORMAL LOW (ref 3.87–5.11)
RDW: 16.8 % — ABNORMAL HIGH (ref 11.5–15.5)
WBC: 6.6 10*3/uL (ref 4.0–10.5)

## 2014-07-18 LAB — GLUCOSE, CAPILLARY
GLUCOSE-CAPILLARY: 105 mg/dL — AB (ref 70–99)
Glucose-Capillary: 105 mg/dL — ABNORMAL HIGH (ref 70–99)
Glucose-Capillary: 110 mg/dL — ABNORMAL HIGH (ref 70–99)

## 2014-07-18 LAB — HAPTOGLOBIN: Haptoglobin: 132 mg/dL (ref 45–215)

## 2014-07-18 LAB — BASIC METABOLIC PANEL
Anion gap: 12 (ref 5–15)
BUN: 13 mg/dL (ref 6–23)
CALCIUM: 8.3 mg/dL — AB (ref 8.4–10.5)
CO2: 28 meq/L (ref 19–32)
Chloride: 103 mEq/L (ref 96–112)
Creatinine, Ser: 0.8 mg/dL (ref 0.50–1.10)
GFR calc Af Amer: 86 mL/min — ABNORMAL LOW (ref >90)
GFR calc non Af Amer: 74 mL/min — ABNORMAL LOW (ref >90)
GLUCOSE: 100 mg/dL — AB (ref 70–99)
Potassium: 3.5 mEq/L — ABNORMAL LOW (ref 3.7–5.3)
SODIUM: 143 meq/L (ref 137–147)

## 2014-07-18 MED ORDER — VITAMIN K1 10 MG/ML IJ SOLN
5.0000 mg | Freq: Once | INTRAVENOUS | Status: AC
  Administered 2014-07-18: 5 mg via INTRAVENOUS
  Filled 2014-07-18: qty 0.5

## 2014-07-18 MED ORDER — FUROSEMIDE 10 MG/ML IJ SOLN
20.0000 mg | INTRAMUSCULAR | Status: DC | PRN
  Administered 2014-07-18 (×3): 20 mg via INTRAVENOUS
  Filled 2014-07-18 (×3): qty 4

## 2014-07-18 MED ORDER — ALPRAZOLAM 0.5 MG PO TABS
0.5000 mg | ORAL_TABLET | Freq: Three times a day (TID) | ORAL | Status: DC | PRN
  Administered 2014-07-19: 0.5 mg via ORAL
  Administered 2014-07-19: 1 mg via ORAL
  Administered 2014-07-19: 0.5 mg via ORAL
  Administered 2014-07-20 – 2014-07-22 (×3): 1 mg via ORAL
  Filled 2014-07-18: qty 1
  Filled 2014-07-18: qty 2
  Filled 2014-07-18: qty 1
  Filled 2014-07-18 (×3): qty 2

## 2014-07-18 MED ORDER — ISOSORBIDE MONONITRATE ER 30 MG PO TB24
30.0000 mg | ORAL_TABLET | Freq: Every day | ORAL | Status: DC
  Administered 2014-07-19 – 2014-07-25 (×7): 30 mg via ORAL
  Filled 2014-07-18 (×8): qty 1

## 2014-07-18 MED ORDER — PANTOPRAZOLE SODIUM 40 MG IV SOLR
40.0000 mg | Freq: Two times a day (BID) | INTRAVENOUS | Status: DC
  Administered 2014-07-18 (×2): 40 mg via INTRAVENOUS
  Filled 2014-07-18 (×5): qty 40

## 2014-07-18 MED ORDER — ATORVASTATIN CALCIUM 20 MG PO TABS
20.0000 mg | ORAL_TABLET | Freq: Every day | ORAL | Status: DC
  Administered 2014-07-18 – 2014-07-25 (×8): 20 mg via ORAL
  Filled 2014-07-18 (×7): qty 1
  Filled 2014-07-18: qty 2
  Filled 2014-07-18 (×2): qty 1

## 2014-07-18 MED ORDER — AMITRIPTYLINE HCL 100 MG PO TABS
100.0000 mg | ORAL_TABLET | Freq: Every day | ORAL | Status: DC
  Administered 2014-07-18 – 2014-07-25 (×8): 100 mg via ORAL
  Filled 2014-07-18 (×9): qty 1

## 2014-07-18 MED ORDER — MORPHINE SULFATE 2 MG/ML IJ SOLN
1.0000 mg | INTRAMUSCULAR | Status: DC | PRN
  Administered 2014-07-18 – 2014-07-19 (×7): 2 mg via INTRAVENOUS
  Filled 2014-07-18 (×7): qty 1

## 2014-07-18 NOTE — Telephone Encounter (Signed)
Refill request sent from [facility] for alprazolam.  Request denied. Pt just got # 90 07/15/14 from [place].

## 2014-07-18 NOTE — Progress Notes (Signed)
Utilization review completed.  

## 2014-07-18 NOTE — Consult Note (Signed)
[facility] Consult: 12:21 PM 07/18/2014  LOS: 1 day    Referring Provider: anemia.   Primary Care Physician:  Tammi Sou, MD Primary Gastroenterologist:   Althia Forts.  Previous EGD in [place]:  Marin Olp MD    Reason for Consultation:  Anemia and FOBT +   HPI: Linda [name] is a 68 y.o. female.  Multiple issues.  On Plavix for ?? (no cardiac stents, no CVA).  On Coumadin for 2014 PE.  Chronic anemia for "16 years", pernicious anemia per charts. .  Treated with regular infusions of parenteral iron by MDs in [facility] (where she had bone marrow biopsy), and by Dr Marin Olp since relocating to [place] 1.5 years ago. Also on monthly B 12 injections. Chronic MS, spine and leg pain from DDD and from fibromyalgia. Husband died in Mar 20, 2023 and her chronic anxiety/depression is worse since then.  Colon polyp in 2012 of undetermined type but 5 year follow up recommended by GI in [place].   Pt had nose bleed starting Friday lasting through Sunday.  Black stool occurred on Saturday but became brown by Sunday. It tested FOBT+ on Sunday. No nausea, no abdominal pain. Takes mobic BID. No dysphagia.   Admitted last PM with falls and LOC.  Hgb of 5.1 to 5.8.  Transfused 2 units blood. No CBC since transfusion.  hgb was ~ 12 to 13 in last 4 weeks but 10.8 eight weeks ago.  INR > 10 at presentation.  No recheck of INR since given FFP x 2 and  10 Mg IV vitamin K. .  Platelets normally WNL but low of 128 noted yesterday.    Started on PPI drip.  No BMs since arrival.       Past Medical History  Diagnosis Date  . HTN (hypertension)   . Depression   . Recurrent UTI     +hx of hospitalization for pyelonephritis  . Hay fever   . Mixed incontinence urge and stress   . Diverticular disease   . Insomnia     . Fibromyalgia     Patient states dx was around her late 73s but she had sx's for years prior to this.  . Syncope     +Hypotensive; ED visit--Dr. Terrence Dupont did Cath--nonobstructive CAD, EF 55-60%  . Idiopathic angio-edema-urticaria 72014    Angioedema component was very minimal  . 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 Asthma     + asbestososis   . History of pneumonia     hospitalized 12/2011, 03-19-13, and 07/2013 [facility]) for this  . High cholesterol     "I'm on Lipitor cause it will help my heart in some way" (07/12/2013)  . Anginal pain     Nonobstructive CAD 2014  . OSA on CPAP   . Iron deficiency anemia     Hematologist in [place], [place] did extensive w/u; no cause found; failed oral supplement;; gets fairly regular (q56mor so) IV iron  infusions (Venofer  267m with procrit.  "for 14 yr I've been getting blood work q month & getting infusions prn" (07/12/2013).  Dr. EMarin Olplocally, iron infusions done, then erythropoietin deficiency diagnosed: aranesp to be started as of 09/2013.  . H/O hiatal hernia   . Migraine syndrome     "not as often anymore; used to be ~ q wk" (07/12/2013)  . Tension headache, chronic   . DDD (degenerative disc disease)     lumbar and cervical.   . Osteoarthritis     "severe; progressing fast" (07/12/2013)  . Chronic lower back pain   . Anxiety     panic attacks  . Nephrolithiasis     "passed all on my own or they are still in there" (07/12/2013)  . Pyelonephritis     "several times over the last 30 yr" (07/12/2013)  . Diastolic congestive heart failure   . COPD (chronic obstructive pulmonary disease)   . Pulmonary embolism 07/2013    [facility] needed x 6 mo; [facility]    . Pleural plaque with presence of asbestos 07/22/2013  . BPPV (benign paroxysmal positional vertigo) 12/16/2012  . RBBB (right bundle branch block)      Past Surgical History  Procedure Laterality Date  . Appendectomy  1960  . Total abdominal hysterectomy  1974  . Tendon release  1996    Right forearm and hand  . Knee surgery  2005  . Heel spur surgery Left 2008  . Plantar fascia release Left 2008  . Axillary surgery Left 1978    Multiple "lump" in armpit per pt  . Coccyx removal  1972  . Cardiac catheterization  01/2013    nonobstructive CAD, EF 55-60%  . Transthoracic echocardiogram  01/2013; 04/2014    2014--NORMAL.  2015--focal basal septal hypertrophy, EF 55-60%, grade I diast dysfxn, mild LAE.    . Dilation and curettage of uterus  ? 1970's  . Eye surgery Left 2012-2013    "injections for ~ 1 yr; don't really know what for" (07/12/2013)  . Spirometry  04/25/14    In hosp for acute asthma/COPD flare: mixed obstructive and restrictive lung disease. The FEV1 is severely reduced at 45% predicted.  FEV1 signif decreased compared to prior spirometry 07/23/13.    Prior to Admission medications   Medication Sig Start Date End Date Taking? Authorizing Provider  albuterol (PROVENTIL HFA;VENTOLIN HFA) 108 (90 BASE) MCG/ACT inhaler Inhale 2 puffs into the lungs every 6 (six) hours as needed for wheezing or shortness of breath.    Yes Historical Provider, MD  albuterol (PROVENTIL) (2.5 MG/3ML) 0.083% nebulizer solution Take 3 mLs (2.5 mg total) by nebulization every 6 (six) hours as needed for wheezing or shortness of breath. 02/11/14  Yes PTammi Sou MD  ALPRAZolam (Duanne Moron 1 MG tablet Take 0.5-1 mg by mouth 3 (three) times daily as needed for anxiety or sleep.   Yes Historical Provider, MD  amitriptyline (ELAVIL) 100 MG tablet Take 100 mg by mouth at bedtime.   Yes Historical Provider, MD  atorvastatin (LIPITOR) 20 MG tablet Take 1 tablet (20 mg total) by mouth daily at 6 PM. 08/30/13  Yes PTammi Sou MD  ciprofloxacin (CIPRO) 500 MG tablet Take 1 tablet (500 mg total) by mouth 2 (two) times daily. 07/15/14  Yes LIrene Pap NP   clopidogrel (PLAVIX) 75 MG tablet Take 75 mg by mouth daily.   Yes Historical Provider, MD  furosemide (  LASIX) 20 MG tablet Take 20 mg by mouth daily.   Yes Historical Provider, MD  isosorbide mononitrate (IMDUR) 30 MG 24 hr tablet Take 30 mg by mouth daily.   Yes Historical Provider, MD  meloxicam (MOBIC) 7.5 MG tablet Take 7.5 mg by mouth 2 (two) times daily.  09/20/13  Yes Historical Provider, MD  Milnacipran (SAVELLA) 50 MG TABS tablet Take 50 mg by mouth 2 (two) times daily.   Yes Historical Provider, MD  Multiple Vitamins-Minerals (CENTRUM SILVER) tablet Take 1 tablet by mouth daily.    Yes Historical Provider, MD  nebivolol (BYSTOLIC) 5 MG tablet Take 5 mg by mouth daily.   Yes Historical Provider, MD  nitroGLYCERIN (NITROSTAT) 0.4 MG SL tablet Place 1 tablet (0.4 mg total) under the tongue every 5 (five) minutes x 3 doses as needed for chest pain. 07/19/13  Yes Reyne Dumas, MD  phenazopyridine (PYRIDIUM) 200 MG tablet Take 1 tablet (200 mg total) by mouth 3 (three) times daily as needed for pain. 07/15/14  Yes Irene Pap, NP  pregabalin (LYRICA) 200 MG capsule Take 200-400 mg by mouth 3 (three) times daily. Take 1 capsule in the morning and supper and 2 capsule at bedtime   Yes Historical Provider, MD  traZODone (DESYREL) 50 MG tablet Take 100-200 mg by mouth at bedtime as needed (for insomnia).   Yes Historical Provider, MD  warfarin (COUMADIN) 5 MG tablet Take 10 mg by mouth daily.    Yes Historical Provider, MD    Scheduled Meds: . [START ON 07/21/2014] pantoprazole (PROTONIX) IV  40 mg Intravenous Q12H   Infusions: . sodium chloride Stopped (07/18/14 0000)  . pantoprozole (PROTONIX) infusion 8 mg/hr (07/18/14 0800)   PRN Meds: acetaminophen, acetaminophen, albuterol, furosemide, morphine injection, nitroGLYCERIN, ondansetron (ZOFRAN) IV, ondansetron   Allergies as of 07/17/2014 - Review Complete 07/17/2014  Allergen Reaction Noted  . Penicillins Itching, Swelling, and Rash  10/16/2012    Family History  Problem Relation Age of Onset  . Arthritis Mother   . Kidney disease Mother   . Heart disease Father   . Stroke Father   . Hypertension Father   . Diabetes Father     History   Social History  . Marital Status: Married    Spouse Name: N/A    Number of Children: N/A  . Years of Education: N/A   Occupational History  . Not on file.   Social History Main Topics  . Smoking status: Never Smoker   . Smokeless tobacco: Never Used     Comment: never used tobacco  . Alcohol Use: No  . Drug Use: No  . Sexual Activity: Not Currently   Other Topics Concern  . Not on file   Social History Narrative   Widowed, 2 sons.  Relocated to [place] 09/2012 to be closer to her son who has MS.   Husband d 2015--mesothelioma.   Occupation: former Pharmacist, hospital.   Education: masters degree level.   No T/A/Ds.    REVIEW OF SYSTEMS: Constitutional:  Weight down 10 # in last several weeks.  ENT:  No nose bleeds Pulm:  No cough.  + DOE CV:  No palpitations, no LE edema.  GU:  No hematuria, no frequency GI:  Per HPI Heme:  Per HPI   Transfusions:  Per HPI Neuro:  No headaches, no peripheral tingling or numbness Derm:  No itching, no rash or sores.  Endocrine:  No sweats or chills.  No polyuria or dysuria Immunization:  Not  querried.  Travel:  None beyond local counties in last few months.    PHYSICAL EXAM: Vital signs in last 24 hours: Filed Vitals:   07/18/14 1158  BP:   Pulse:   Temp: 98.5 F (36.9 C)  Resp:    Wt Readings from Last 3 Encounters:  07/18/14 76 kg (167 lb 8.8 oz)  07/08/14 78.472 kg (173 lb)  06/10/14 80.287 kg (177 lb)   General: pale, unwell, comfortable WF Head:  No asymmetry or swelling  Eyes:  No icterus or pallor Ears:  Not HOH  Nose:  No discharge or dry blood Mouth:  Upper full dentures.  Poor dentition below Neck:  No mass, no JVD Lungs:  Clear bil.  No cough or dyspnea Heart: RRR.  No mrg Abdomen:  Soft, ND.  NT.  No HSM  or mass.  No bruits.  Active BS.   Rectal: empty vault, no blood or stool.  No mass   Musc/Skeltl: no erythema or swelling  Extremities:  No CCE  Neurologic:  No tremors, no confusion.  No limb weakness.  Skin:  No telangectasia or rash Tattoos:  none Nodes:  No cervical adenopathy.    Psych:  Depressed, near tears at time.  Blunt, depressed affect.   Intake/Output from previous day: 07/26 0701 - 07/27 0700 In: 1137.5 [I.V.:750; Blood:387.5] Out: 3075 [Urine:3075] Intake/Output this shift: Total I/O In: 175 [Blood:175] Out: 650 [Urine:650]  LAB RESULTS:  Recent Labs  07/17/14 1804 07/17/14 1947  WBC 6.4 5.3  HGB 5.1* 5.8*  HCT 15.5* 17.6*  PLT 128* 139*   BMET Lab Results  Component Value Date   NA 143 07/17/2014   NA 145 07/08/2014   NA 141 04/28/2014   K 3.8 07/17/2014   K 4.0 07/08/2014   K 3.5* 04/28/2014   CL 106 07/17/2014   CL 105 07/08/2014   CL 100 04/28/2014   CO2 24 07/17/2014   CO2 28 07/08/2014   CO2 27 04/28/2014   GLUCOSE 86 07/17/2014   GLUCOSE 90 07/08/2014   GLUCOSE 104* 04/28/2014   BUN 25* 07/17/2014   BUN 19 07/08/2014   BUN 19 04/28/2014   CREATININE 0.91 07/17/2014   CREATININE 1.00 07/08/2014   CREATININE 0.73 04/28/2014   CALCIUM 7.5* 07/17/2014   CALCIUM 9.1 07/08/2014   CALCIUM 8.8 04/28/2014   LFT  Recent Labs  07/17/14 1804  PROT 5.0*  ALBUMIN 2.7*  AST 18  ALT 13  ALKPHOS 50  BILITOT <0.2*   PT/INR Lab Results  Component Value Date   INR >10.00* 07/17/2014   INR 2.8 07/08/2014   INR 2.4 07/01/2014   PROTIME 33.6* 07/08/2014   PROTIME 28.8* 07/01/2014   PROTIME 15.6* 06/17/2014   Hepatitis Panel No results found for this basename: HEPBSAG, HCVAB, HEPAIGM, HEPBIGM,  in the last 72 hours C-Diff No components found with this basename: cdiff   Lipase  No results found for this basename: lipase    Drugs of Abuse     Component Value Date/Time   LABOPIA NONE DETECTED 01/30/2013 0540   COCAINSCRNUR NONE DETECTED 01/30/2013 0540   LABBENZ  POSITIVE* 01/30/2013 0540   AMPHETMU NONE DETECTED 01/30/2013 0540   THCU NONE DETECTED 01/30/2013 0540   LABBARB POSITIVE* 01/30/2013 0540     RADIOLOGY STUDIES: Ct Abdomen Pelvis Wo Contrast 07/18/2014    COMPARISON:  CT of the chest Apr 25, 2014  FINDINGS: LUNG BASES: Calcified pleural plaques and pleural thickening. The heart appears mildly enlarged, pericardium is  nonsuspicious.  KIDNEYS/BLADDER: Kidneys are orthotopic, demonstrating normal size and morphology. Punctate right upper pole nephrolithiasis. No hydronephrosis; limited assessment for renal masses on this nonenhanced examination. The unopacified ureters are normal in course and caliber. Urinary bladder is partially distended and unremarkable.  SOLID ORGANS: The liver, spleen, gallbladder, pancreas and adrenal glands are unremarkable for this non-contrast examination.  GASTROINTESTINAL TRACT: The stomach, small and large bowel are normal in course and caliber without inflammatory changes, the sensitivity may be decreased by lack of enteric contrast. The appendix is not discretely identified, however there are no inflammatory changes in the right lower quadrant.  PERITONEUM/RETROPERITONEUM: No intraperitoneal free fluid nor free air. Aortoiliac vessels are normal in course and caliber, lower calcific atherosclerosis. No lymphadenopathy by CT size criteria. Status post hysterectomy. Phleboliths in the pelvis.  SOFT TISSUES/ OSSEOUS STRUCTURES: Grade 1 L5-S1 anterolisthesis without pars interarticularis defect, mild L5-S1 neural foraminal narrowing. Bilateral gluteal injection granulomas.  IMPRESSION: No acute intra-abdominal or pelvic process; specifically no retroperitoneal hematoma.  Calcified pleural plaques and pleural thickening, please see CT of chest Apr 25, 2014 for further description.   Electronically Signed   By: Elon Alas   On: 07/18/2014 02:35    Dg Lumbar Spine 2-3 Views 07/18/2014 COMPARISON:  Bone window images from CT abdomen and  pelvis earlier same date. Lumbar spine x-rays 12/08/2013.  FINDINGS: Five non rib-bearing lumbar vertebrae. Grade 1 spondylolisthesis of L5 on S1 approximating 8 mm, unchanged from the prior examination, due to severe facet degenerative changes at this level. Mild disc space narrowing at L3-4 and L4-5, unchanged. Remaining disc spaces well preserved. Facet degenerative changes at L3-4 and L4-5, unchanged. Visualized sacroiliac joints intact.  IMPRESSION: 1. No acute osseous abnormality. 2. Degenerative grade 1 spondylolisthesis of L5 on S1 approximating 8 mm, stable since December, 2014. 3. Stable mild degenerative disc disease at L3-4 and L4-5. 4. Stable facet degenerative changes at L3-4 and L4-5.   Electronically Signed   By: Evangeline Dakin M.D.   On: 07/18/2014 07:58   Dg Pelvis 1-2 Views 07/18/2014  COMPARISON:  Lumbar spine series of today's date.  FINDINGS: The bony pelvis is adequately mineralized. There is no acute fracture. The observed portions of the sacrum are unremarkable. There are degenerative facet joint changes at L5-S1. There is mild narrowing of the hip joints greater on the right than on the left.  IMPRESSION: There is no acute bony abnormality of the pelvis.   Electronically Signed   By: David  Martinique   On: 07/18/2014 07:54   Ct Head Wo Contrast 07/17/2014     COMPARISON:  01/30/2013  FINDINGS: Calvarium is intact. No significant inflammatory change in the visualized portions of the paranasal sinuses.  Mild age-related atrophy. No evidence of hemorrhage or extra-axial fluid. No evidence of infarct or hydrocephalus.  There is a suggestion of an extra-axial mass near the right petrous bone measuring about 13 mm. This appears contiguous however within overlying gyrus and an identical appearance is identified on 12/14/2012.  IMPRESSION: Suggestion of mass arising off the right petrous bone is likely due to volume averaging with a prominent overlying gyrus of right cerebellum, as the  appearance is very similar to 12/14/2012. MRI should be considered to confirm this however.   Electronically Signed   By: Skipper Cliche M.D.   On: 07/17/2014 19:55    ENDOSCOPIC STUDIES: 03/2011  Colonoscopy and EGD in [facility] "Polyp, 5 year recall per pt" No ulcers or UGI pathology.   IMPRESSION:   *  Acute on chronic anemia.  Pernicious anemia treated with B12 monthly in setting of severe coagulopathy. Long standing chronic, parenteral iron requiring anemia.  Managed by Dr Marin Olp.  Previous bone marrown biopsies.  Never treated with blood transfusion before these last 24 hours.    *  FOBT + stool.  Dark stools Saturday, subsequently cleared, in setting of epistaxis of Friday - Sunday. Also on daily Mobic, could have NSAID ulcer.  Dr. Marin Olp mentions possible need for Aranesp due to epo level of 6.3 in 11/2013.    *  Chronic Plavix and Coumadin. Hx PE 06/2013, not clear why Plavix on board and neither is pt. Dr Dicie Beam notes say she ought to be on Coumadin for 1.5 to 2 years.   *  Coagulopathy.  Sp FFP and vitamin K  *  Thrombocytopenia.   *  Chronic back and leg pain.  On chronic Mobic  *  Anxiety and depression.   *  OSA.  On CPCP.   *  Chronic pain.     PLAN:     *  CBC and coags just being drawn now.  *  Can pursue EGD once INR is normal.  Will put her on schedule for tomorrow.  *  Switch to BID IV Protonix now.    Azucena Freed  07/18/2014, 12:21 PM Pager: [phone]       Attending physician's note   I have taken a history, examined the patient and reviewed the chart. I agree with the Advanced Practitioner's note, impression and recommendations. INR has corrected to 1.25 and last Hb=8.2. Bleeding from epistaxis. R/O UGI bleed source as well. EGD tomorrow. Chronic B12 and Fe deficiency managed by Dr. Marin Olp.   Ladene Artist, MD [facility]

## 2014-07-18 NOTE — Progress Notes (Signed)
[facility]  Linda [name]:[id number] DOB: [birth year] DOA: 07/17/2014 PCP: Tammi Sou, MD  Brief narrative: 68 yo female with history of pulmonary embolism on chronic anticoagulation, hypertension, nonobstructive CAD and fibromyalgia. Also has asthma and asbestosis as well as sleep apnea. She presented to the emergency department after experiencing a loss of consciousness. She had 3 episodes over the previous 24 hours. Also recent diagnosis of urinary tract infection and was taking Cipro. She also noticed epistaxis over several days and noticed increased bruising as well. Because the patient was so dizzy upon standing the family notified EMS.  In the ER the patient was noted to be orthostatic. Her stool was positive for occult blood. Her hemoglobin was noted to be 5 which was confirmed as a true result. In review of the electronic medical record patient's hemoglobin was 12.8 on 07/08/2014.  She was on Coumadin, Plavix and meloxicam.  CT of the head with incidental finding of nonspecific changes in the petrous bone which may require followup MRI.  HPI/Subjective: Primarily complaining of low back pain which she attributes to the bed. Also endorsing significant generalized weakness.  Assessment/Plan:    Anemia associated with acute blood loss -Baseline hemoglobin reported as around 10 or higher -Has had chronic anemia for many years and was actually worked up by hematologist in [place] and when her hemoglobin drops below 10 she usually receives transfusion -Currently Hemoccult positive which is concerning for active GI bleeding as opposed to a true hematologic etiology -Status post 2 units PRBC since admission with increasing hemoglobin to 8.2 -Anemia followed by Dr. Marin Olp here in [place]    Acute GI bleeding -Suspect upper GI source given presentation and pre-admission medication -No further melena since admission -GI consult  pending -Suspect will undergo endoscopy this admission   Supratherapeutic INR -INR greater than 10 at presentation Has received vitamin K x2 as well as 2 units FFP -Followup PT 15.8 with an INR of 1.2     HTN (hypertension), benign -Blood pressure was actually softer but after transfusion BP has improved -Continue to hold antihypertensive agent    Pleural plaque with presence of asbestos -Followed by pulmonologist as an outpatient    Chronic diastolic CHF, NYHA class 1 -Compensated    Syncope -Seems directly related to symptomatic anemia -Follow    Nonspecific changes in the petrous bone -radiologist ntoes this is probably due to volume averaging, and is stable since 2013 but nonetheless suggests MRI for f/u - will pursue once more stable from bleeding standpoint    Fibromyalgia syndrome/Polypharmacy -Patient reports that Fentanyl not effective in pain control therefore we'll discontinue -Patient normally takes Percocet at home -N.p.o. so we'll order IV morphine when necessary for now and reevaluate -Once hemodynamically stable and no further symptomatic orthostasis would certainly benefit from mobilization    OSA  -continue nocturnal CPAP    History of pulmonary embolism -Anticoagulation on hold -Per outpatient hematology note the recommendation is for her to remain on Coumadin for the next several years -PE was diagnosed August 2014   DVT prophylaxis: SCDs  Code Status: Full  Family Communication: Son at bedside  Disposition Plan/Expected LOS: Stepdown   Consultants: Gastroenterology  Procedures: None  Cultures: None  Antibiotics: None  Objective: Blood pressure 147/61, pulse 87, temperature 98.5 F (36.9 C), temperature source Oral, resp. rate 18, height 5\' 1"  (1.549 m), weight 167 lb 8.8 oz (76 kg), SpO2 100.00%.  Intake/Output Summary (Last  24 hours) at 07/18/14 1357 Last data filed at 07/18/14 0915  Gross per 24 hour  Intake 1312.5 ml  Output    3725 ml  Net -2412.5 ml     Exam: General: No acute respiratory distress-very pale appearing  Lungs: Clear to auscultation bilaterally without wheezes or crackles, 2 L  Cardiovascular: Regular rate and rhythm without murmur gallop or rub normal S1 and S2, no peripheral edema or JVD Abdomen: Nontender, nondistended, soft, bowel sounds positive, no rebound, no ascites, no appreciable mass Musculoskeletal: No significant cyanosis, clubbing of bilateral lower extremities   Scheduled Meds:  Scheduled Meds: . pantoprazole (PROTONIX) IV  40 mg Intravenous Q12H   Data Reviewed: Basic Metabolic Panel:  Recent Labs Lab 07/17/14 1804 07/18/14 1215  NA 143 143  K 3.8 3.5*  CL 106 103  CO2 24 28  GLUCOSE 86 100*  BUN 25* 13  CREATININE 0.91 0.80  CALCIUM 7.5* 8.3*   Liver Function Tests:  Recent Labs Lab 07/17/14 1804  AST 18  ALT 13  ALKPHOS 50  BILITOT <0.2*  PROT 5.0*  ALBUMIN 2.7*   CBC:  Recent Labs Lab 07/17/14 1804 07/17/14 1947 07/18/14 1215  WBC 6.4 5.3 6.6  NEUTROABS 3.6  --   --   HGB 5.1* 5.8* 8.2*  HCT 15.5* 17.6* 25.2*  MCV 94.5 95.7 87.5  PLT 128* 139* 154   BNP (last 3 results)  Recent Labs  09/13/13 1520 04/23/14 1800 04/26/14 0708  PROBNP 262.4* 801.7* 2423.0*   CBG:  Recent Labs Lab 07/18/14 0623 07/18/14 1201  GLUCAP 105* 105*    Recent Results (from the past 240 hour(s))  URINE CULTURE     Status: None   Collection Time    07/15/14 12:47 PM      Result Value Ref Range Status   Culture ESCHERICHIA COLI   Final   Colony Count >=100,000 COLONIES/ML   Final   Organism ID, Bacteria ESCHERICHIA COLI   Final     Studies:  Recent x-ray studies have been reviewed in detail by the Attending Physician  Time spent :  49 mins      Erin Hearing, ANP [facility] Office  [phone] Pager [phone]   **If unable to reach the above provider after paging please contact the Flow Manager @ [phone]  On-Call/Text  Page:      [email or web address]      password [email or web address]  If 7PM-7AM, please contact night-coverage [email or web address] Password [email or web address] 07/18/2014, 1:57 PM   LOS: 1 day   I have personally examined this patient and reviewed the entire database. I have reviewed the above note, made any necessary editorial changes, and agree with its content.  Cherene Altes, MD [facility]

## 2014-07-18 NOTE — Telephone Encounter (Signed)
Urine culture confirms UTI. ABX prescribed is appropriate. No changes.  pls call pt: Advise Continue with treatment as discussed.

## 2014-07-19 ENCOUNTER — Encounter (HOSPITAL_COMMUNITY)
Admission: EM | Disposition: A | Discharge status: Rehabilitation Facility | Payer: Self-pay | Source: Home / Self Care | Attending: Internal Medicine

## 2014-07-19 ENCOUNTER — Encounter (HOSPITAL_COMMUNITY): Payer: Self-pay | Admitting: *Deleted

## 2014-07-19 ENCOUNTER — Encounter (HOSPITAL_COMMUNITY): Payer: Medicare Other | Admitting: Certified Registered Nurse Anesthetist

## 2014-07-19 ENCOUNTER — Inpatient Hospital Stay (HOSPITAL_COMMUNITY): Payer: Medicare Other | Admitting: Certified Registered Nurse Anesthetist

## 2014-07-19 DIAGNOSIS — Z7901 Long term (current) use of anticoagulants: Secondary | ICD-10-CM

## 2014-07-19 DIAGNOSIS — F329 Major depressive disorder, single episode, unspecified: Secondary | ICD-10-CM

## 2014-07-19 DIAGNOSIS — F3289 Other specified depressive episodes: Secondary | ICD-10-CM

## 2014-07-19 DIAGNOSIS — IMO0001 Reserved for inherently not codable concepts without codable children: Secondary | ICD-10-CM

## 2014-07-19 DIAGNOSIS — G4733 Obstructive sleep apnea (adult) (pediatric): Secondary | ICD-10-CM

## 2014-07-19 DIAGNOSIS — J61 Pneumoconiosis due to asbestos and other mineral fibers: Secondary | ICD-10-CM

## 2014-07-19 DIAGNOSIS — R791 Abnormal coagulation profile: Secondary | ICD-10-CM

## 2014-07-19 DIAGNOSIS — K319 Disease of stomach and duodenum, unspecified: Secondary | ICD-10-CM

## 2014-07-19 HISTORY — PX: ESOPHAGOGASTRODUODENOSCOPY: SHX5428

## 2014-07-19 LAB — PREPARE FRESH FROZEN PLASMA
UNIT DIVISION: 0
UNIT DIVISION: 0
Unit division: 0
Unit division: 0

## 2014-07-19 LAB — BASIC METABOLIC PANEL
ANION GAP: 9 (ref 5–15)
BUN: 11 mg/dL (ref 6–23)
CHLORIDE: 105 meq/L (ref 96–112)
CO2: 28 meq/L (ref 19–32)
Calcium: 8.5 mg/dL (ref 8.4–10.5)
Creatinine, Ser: 0.76 mg/dL (ref 0.50–1.10)
GFR calc Af Amer: >90 mL/min (ref >90)
GFR calc non Af Amer: 85 mL/min — ABNORMAL LOW (ref >90)
GLUCOSE: 92 mg/dL (ref 70–99)
Potassium: 3.3 mEq/L — ABNORMAL LOW (ref 3.7–5.3)
SODIUM: 142 meq/L (ref 137–147)

## 2014-07-19 LAB — CBC WITH DIFFERENTIAL/PLATELET
Basophils Absolute: 0 10*3/uL (ref 0.0–0.1)
Basophils Relative: 1 % (ref 0–1)
EOS PCT: 2 % (ref 0–5)
Eosinophils Absolute: 0.1 10*3/uL (ref 0.0–0.7)
HEMATOCRIT: 25.8 % — AB (ref 36.0–46.0)
Hemoglobin: 8.5 g/dL — ABNORMAL LOW (ref 12.0–15.0)
LYMPHS ABS: 1.7 10*3/uL (ref 0.7–4.0)
Lymphocytes Relative: 30 % (ref 12–46)
MCH: 28.9 pg (ref 26.0–34.0)
MCHC: 32.9 g/dL (ref 30.0–36.0)
MCV: 87.8 fL (ref 78.0–100.0)
MONO ABS: 0.6 10*3/uL (ref 0.1–1.0)
Monocytes Relative: 11 % (ref 3–12)
NEUTROS ABS: 3.2 10*3/uL (ref 1.7–7.7)
Neutrophils Relative %: 56 % (ref 43–77)
Platelets: 150 10*3/uL (ref 150–400)
RBC: 2.94 MIL/uL — ABNORMAL LOW (ref 3.87–5.11)
RDW: 16.5 % — ABNORMAL HIGH (ref 11.5–15.5)
WBC: 5.7 10*3/uL (ref 4.0–10.5)

## 2014-07-19 LAB — TYPE AND SCREEN
ABO/RH(D): A POS
Antibody Screen: NEGATIVE
UNIT DIVISION: 0
Unit division: 0

## 2014-07-19 LAB — GLUCOSE, CAPILLARY: GLUCOSE-CAPILLARY: 80 mg/dL (ref 70–99)

## 2014-07-19 LAB — PROTIME-INR
INR: 1.16 (ref 0.00–1.49)
PROTHROMBIN TIME: 14.8 s (ref 11.6–15.2)

## 2014-07-19 SURGERY — EGD (ESOPHAGOGASTRODUODENOSCOPY)
Anesthesia: Monitor Anesthesia Care

## 2014-07-19 MED ORDER — BUTAMBEN-TETRACAINE-BENZOCAINE 2-2-14 % EX AERO
INHALATION_SPRAY | CUTANEOUS | Status: DC | PRN
  Administered 2014-07-19: 2 via TOPICAL

## 2014-07-19 MED ORDER — POTASSIUM CHLORIDE CRYS ER 20 MEQ PO TBCR
40.0000 meq | EXTENDED_RELEASE_TABLET | Freq: Once | ORAL | Status: AC
  Administered 2014-07-19: 40 meq via ORAL
  Filled 2014-07-19: qty 2

## 2014-07-19 MED ORDER — PROPOFOL INFUSION 10 MG/ML OPTIME
INTRAVENOUS | Status: DC | PRN
  Administered 2014-07-19: 100 ug/kg/min via INTRAVENOUS

## 2014-07-19 MED ORDER — SODIUM CHLORIDE 0.9 % IV SOLN: INTRAVENOUS | Status: DC

## 2014-07-19 MED ORDER — LACTATED RINGERS IV SOLN
INTRAVENOUS | Status: DC | PRN
  Administered 2014-07-19: 10:00:00 via INTRAVENOUS

## 2014-07-19 MED ORDER — NEBIVOLOL HCL 5 MG PO TABS
5.0000 mg | ORAL_TABLET | Freq: Every day | ORAL | Status: DC
  Administered 2014-07-19 – 2014-07-26 (×8): 5 mg via ORAL
  Filled 2014-07-19 (×9): qty 1

## 2014-07-19 MED ORDER — CIPROFLOXACIN IN D5W 400 MG/200ML IV SOLN
400.0000 mg | Freq: Two times a day (BID) | INTRAVENOUS | Status: DC
  Administered 2014-07-19 – 2014-07-20 (×2): 400 mg via INTRAVENOUS
  Filled 2014-07-19 (×4): qty 200

## 2014-07-19 MED ORDER — PREGABALIN 50 MG PO CAPS
200.0000 mg | ORAL_CAPSULE | Freq: Every day | ORAL | Status: DC
  Administered 2014-07-19: 200 mg via ORAL
  Filled 2014-07-19: qty 2

## 2014-07-19 MED ORDER — PAROXETINE HCL 20 MG PO TABS
20.0000 mg | ORAL_TABLET | Freq: Every day | ORAL | Status: DC
  Administered 2014-07-19 – 2014-07-26 (×8): 20 mg via ORAL
  Filled 2014-07-19 (×9): qty 1

## 2014-07-19 MED ORDER — PREGABALIN 50 MG PO CAPS
400.0000 mg | ORAL_CAPSULE | Freq: Every day | ORAL | Status: DC
  Administered 2014-07-19: 400 mg via ORAL
  Filled 2014-07-19: qty 4
  Filled 2014-07-19: qty 2

## 2014-07-19 MED ORDER — MIDAZOLAM HCL 5 MG/5ML IJ SOLN
INTRAMUSCULAR | Status: DC | PRN
  Administered 2014-07-19: 2 mg via INTRAVENOUS

## 2014-07-19 MED ORDER — OXYCODONE-ACETAMINOPHEN 5-325 MG PO TABS
2.0000 | ORAL_TABLET | ORAL | Status: DC | PRN
  Administered 2014-07-19 – 2014-07-26 (×26): 2 via ORAL
  Filled 2014-07-19 (×27): qty 2

## 2014-07-19 MED ORDER — PROPOFOL 10 MG/ML IV BOLUS
INTRAVENOUS | Status: DC | PRN
  Administered 2014-07-19: 50 mg via INTRAVENOUS

## 2014-07-19 MED ORDER — PANTOPRAZOLE SODIUM 40 MG PO TBEC
40.0000 mg | DELAYED_RELEASE_TABLET | Freq: Every day | ORAL | Status: DC
  Administered 2014-07-19 – 2014-07-26 (×8): 40 mg via ORAL
  Filled 2014-07-19 (×8): qty 1

## 2014-07-19 MED ORDER — PREGABALIN 100 MG PO CAPS: 200.0000 mg | ORAL_CAPSULE | Freq: Three times a day (TID) | ORAL | Status: DC

## 2014-07-19 MED ORDER — FENTANYL CITRATE 0.05 MG/ML IJ SOLN
INTRAMUSCULAR | Status: DC | PRN
  Administered 2014-07-19: 25 ug via INTRAVENOUS
  Administered 2014-07-19: 50 ug via INTRAVENOUS

## 2014-07-19 MED ORDER — MORPHINE SULFATE 2 MG/ML IJ SOLN
1.0000 mg | INTRAMUSCULAR | Status: DC | PRN
Start: 2014-07-19 — End: 2014-07-19

## 2014-07-19 MED ORDER — TRAZODONE HCL 100 MG PO TABS
100.0000 mg | ORAL_TABLET | Freq: Every evening | ORAL | Status: DC | PRN
  Administered 2014-07-20 – 2014-07-21 (×3): 100 mg via ORAL
  Administered 2014-07-21 – 2014-07-23 (×3): 200 mg via ORAL
  Administered 2014-07-24: 100 mg via ORAL
  Administered 2014-07-25: 200 mg via ORAL
  Filled 2014-07-19 (×6): qty 2

## 2014-07-19 NOTE — Progress Notes (Signed)
Transferred via bed per pt request.

## 2014-07-19 NOTE — Progress Notes (Signed)
[facility]  KAHLEA [name] KZS:[id number] DOB: 16-Jan-1946 DOA: 07/17/2014 PCP: Tammi Sou, MD  Brief narrative: 68 yo WF PMHx pulmonary embolism on chronic anticoagulation, hypertension, nonobstructive CAD and fibromyalgia. Also has asthma and asbestosis as well as sleep apnea, on chronic O2 on home (2-3 L O2 via [place]). She presented to the emergency department after experiencing a loss of consciousness. She had 3 episodes over the previous 24 hours. Also recent diagnosis of urinary tract infection and was taking Cipro. She also noticed epistaxis over several days and noticed increased bruising as well. Because the patient was so dizzy upon standing the family notified EMS.  In the ER the patient was noted to be orthostatic. Her stool was positive for occult blood. Her hemoglobin was noted to be 5 which was confirmed as a true result. In review of the electronic medical record patient's hemoglobin was 12.8 on 07/08/2014.  She was on Coumadin, Plavix and meloxicam.  CT of the head with incidental finding of nonspecific changes in the petrous bone which may require followup MRI.  HPI/Subjective: Still weak- no further melena.  Assessment/Plan:    Anemia associated with acute blood loss -Baseline hemoglobin reported as around 10 or higher -Has had chronic anemia for many years and was actually worked up by hematologist in [place] and when her hemoglobin drops below 10 she usually receives transfusion -Status post 2 units PRBC since admission -hgb remains stable -Anemia followed by Dr. Marin Olp (oncology) here in [facility]    Acute GI bleeding due to gastritis -Suspect upper GI source given presentation and pre-admission medication -No further melena since admission -GI consulted -EGD with gastritis and antral mass/biopsy path pending -no further NSAIDs (was on Mobic preadmit) -GI recommends no Plavix if needs to remain on Coumadin   Antral mass -path pending   Supratherapeutic INR -INR greater than 10 at presentation Has received vitamin K x2 as well as 2 units FFP -Followup PT 15.8 with an INR of 1.2   CAD -has some disease/no stent -7/28 d/w Dr Terrence Dupont: states pt had stress test July 2014 without ischemia so ok to dc Plavix per recs of GI (especially if needs to resume Coumadin)   History of pulmonary embolism -Anticoagulation on hold -Per outpatient hematology note the recommendation is for her to remain on Coumadin for the next several years; recommend holding for at least 2 weeks before restarting -PE was diagnosed August 2014-have asked hematology to consult to make recommendations about efficacy of stopping this med in setting of GIB (see EGD note)     HTN (hypertension), benign -Blood pressure was actually softer but after transfusion BP has improved -Continue to hold antihypertensive agent    Pleural plaque with presence of asbestos -Followed by pulmonologist as an outpatient    Chronic diastolic CHF, NYHA class 1 -Compensated    Syncope -Seems directly related to symptomatic anemia -Follow    Nonspecific changes in the petrous bone -radiologist ntoes this is probably due to volume averaging, and is stable since 2013 but nonetheless suggests MRI for f/u -Pursue once more stable from bleeding standpoint; per oncology requires biopsy    Fibromyalgia syndrome/Polypharmacy/Grief reaction -Patient reports that Fentanyl not effective in pain control therefore will discontinue -Patient normally takes Percocet at home per her report-ordered here -per En[facility]'s office notes pt has been struggling with depression since her husband died earlier this year and endorsing fatigue and increased reports of pain -Consulted Palliative to  see regarding emotional support resources/grief management  Depression -Secondary to the recent death of husband, as well as her multiple medical problems. -Start Paxil 20 mg  daily    OSA  -continue nocturnal CPAP     DVT prophylaxis: SCDs  Code Status: Full  Family Communication: Son at bedside  Disposition Plan/Expected LOS: Transfer to floor   Consultants: Dr. Charolette Forward (cardiology)  Dr. Lucio [facility] Sherrilyn Rist (oncology)    Procedures: None  Cultures: None  Antibiotics: None  Objective: Blood pressure 131/66, pulse 84, temperature 98 F (36.7 C), temperature source Oral, resp. rate 16, height 5\' 1"  (1.549 m), weight 173 lb 8 oz (78.7 kg), SpO2 98.00%.  Intake/Output Summary (Last 24 hours) at 07/19/14 1506 Last data filed at 07/19/14 1200  Gross per 24 hour  Intake   1585 ml  Output   1450 ml  Net    135 ml     Exam: General: Tearful, difficulty concentrating on one subject, No acute respiratory distress-very pale appearing  Lungs: Clear to auscultation bilaterally without wheezes or crackles, 2 L  Cardiovascular: Regular rate and rhythm without murmur gallop or rub normal S1 and S2, no peripheral edema or JVD Abdomen: Nontender, nondistended, soft, bowel sounds positive, no rebound, no ascites, no appreciable mass Musculoskeletal: No significant cyanosis, clubbing of bilateral lower extremities   Scheduled Meds:  Scheduled Meds: . amitriptyline  100 mg Oral QHS  . atorvastatin  20 mg Oral q1800  . ciprofloxacin  400 mg Intravenous BID  . isosorbide mononitrate  30 mg Oral Daily  . nebivolol  5 mg Oral Daily  . pantoprazole  40 mg Oral Q0600  . pregabalin  200 mg Oral Daily   And  . pregabalin  400 mg Oral QHS   Data Reviewed: Basic Metabolic Panel:  Recent Labs Lab 07/17/14 1804 07/18/14 1215 07/19/14 0315  NA 143 143 142  K 3.8 3.5* 3.3*  CL 106 103 105  CO2 24 28 28   GLUCOSE 86 100* 92  BUN 25* 13 11  CREATININE 0.91 0.80 0.76  CALCIUM 7.5* 8.3* 8.5   Liver Function Tests:  Recent Labs Lab 07/17/14 1804  AST 18  ALT 13  ALKPHOS 50  BILITOT <0.2*  PROT 5.0*  ALBUMIN  2.7*   CBC:  Recent Labs Lab 07/17/14 1804 07/17/14 1947 07/18/14 1215 07/19/14 0710  WBC 6.4 5.3 6.6 5.7  NEUTROABS 3.6  --   --  3.2  HGB 5.1* 5.8* 8.2* 8.5*  HCT 15.5* 17.6* 25.2* 25.8*  MCV 94.5 95.7 87.5 87.8  PLT 128* 139* 154 150   BNP (last 3 results)  Recent Labs  09/13/13 1520 04/23/14 1800 04/26/14 0708  PROBNP 262.4* 801.7* 2423.0*   CBG:  Recent Labs Lab 07/18/14 0623 07/18/14 1201 07/18/14 2340 07/19/14 0609  GLUCAP 105* 105* 110* 80    Recent Results (from the past 240 hour(s))  URINE CULTURE     Status: None   Collection Time    07/15/14 12:47 PM      Result Value Ref Range Status   Culture ESCHERICHIA COLI   Final   Colony Count >=100,000 COLONIES/ML   Final   Organism ID, Bacteria ESCHERICHIA COLI   Final     Studies:  Recent x-ray studies have been reviewed in detail by the Attending Physician  Time spent :  35 mins      Erin Hearing, ANP [facility] Office  [phone] Pager [phone]   **If unable to  reach the above provider after paging please contact the [place] @ [phone]  On-Call/Text Page:      [email or web address]      password [email or web address]  If 7PM-7AM, please contact night-coverage [email or web address] Password [email or web address] 07/19/2014, 3:06 PM   LOS: 2 days   Examined patient with ANP Ebony Hail and agree with above assessment and plan.  Answered all questions to include that patient would be at increased risk of SDH in the future.  Patient with multiple complex medical problems> 40 minutes spent in direct patient care

## 2014-07-19 NOTE — Consult Note (Signed)
[place]  [phone]   Requesting Provider: [facility]  Consulting Provider: Allen Kell  Primary Oncologist: Riley Churches CONSULTATION  NOTE  Reason for Consultation: Bleeding Issues  HPI: We have been informed of Mrs. Couper's admission to help in the management of bleeding issues.  Ms. Barkalow is a 68 year old woman with multiple medical issues, including a history of  Idiopathic pulmonary embolism, Iron deficiency and Pernicious  anemia followed at the [facility] by Dr. Marin Olp. Patient receives IV Iron as needed, B12 1 mg IM q month and has been on Coumadin for the last 2 years, last seen in office on 7/17. No IV Iron was received as her labs were adequate, but she did receive B12 that day. Of note ,her last CT angiogram of the chest done back in May did not show any evidence of residual pulmonary embolism. INR was 2.8, therapeutic.  In addition, she has been on chronic Plavix due to cardiac issues. Other pertinent medical history includes MS and exposure to asbestos.She also takes Mobic bid for arthritic pain. She was admitted on 7/26 with 3 day history of epistaxis and dark stools.this was complicated with falls and loss of consciousness.  No hematemesis. No nausea or diarrhea. No blood in the urine. No abdominal pain. On admission, her Guaiac was positive. Hgb was 5.1 with repeat 5.8. She was transfused 2 units blood with good response. Hgb was ~ 12 to 13 one month prior.  Per chart notes, INR > 10 at presentation.Given FFP x 2 and 10 Mg IV vitamin K. Today is at 1.16 Platelets were low at 128 on admission, now normalized at 150k. WBC were wnl.  UA and Creatinine were normal. However, urine cultures were positive for UTI, for which she was placed on IV antibiotics with Cipro. CT of the abdomen and pelvis on 7/27 was negative for  acute intra-abdominal or pelvic process; specifically no retroperitoneal hematoma. CXR was  negative for acute findings. CT of the head on 7/27 suggested a mass arising off the right petrous bone is likely due to volume averaging with a prominent overlying gyrus of right cerebellum, as the appearance is very similar to known mass of  12/14/2012 GI consultation was obtained. She had upper endoscopy today, 7/28 for evaluation of melena (Dr. Fuller Plan).  Gastritis in the gastric body was noted. Also,  nodule in the gastric antrum was seen; multiple biopsies were obtained, with path results pending. GI recommendations are to avoid use of Plavix and Coumadin together, avoid all NSAIDs. POssibility of filter placement for VTE prophylaxis is being entertained.  Today, she denies any further bleeding issues, shortness of breath or chest pain. No confusion. She has not been able to ambulate since admission due to hypotension and debilitation. Her appetite is decreased, all these issues being addressed by the admitting team.   Past Medical History   Diagnosis  Date   .  HTN (hypertension)    .  Depression    .  Recurrent UTI      +hx of hospitalization for pyelonephritis   .  Hay fever    .  Mixed incontinence urge and stress    .  Diverticular disease    .  Insomnia    .  Fibromyalgia      Patient states dx was around her late 64s but she had sx's for years prior to this.   .  Syncope      +Hypotensive;  ED visit--Dr. Terrence Dupont did Cath--nonobstructive CAD, EF 55-60%   .  Idiopathic angio-edema-urticaria  72014     Angioedema component was very minimal   .  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  Asthma      + asbestososis   .  History of pneumonia      hospitalized 12/2011, 02/2013, and 07/2013 [facility]) for this   .  High cholesterol      "I'm on Lipitor cause it will help my heart in some way" (07/12/2013)   .  Anginal pain      Nonobstructive CAD 2014   .  OSA on CPAP    .  Iron deficiency anemia       Hematologist in [place], [place] did extensive w/u; no cause found; failed oral supplement;; gets fairly regular (q98m or so) IV iron infusions (Venofer 200mg  with procrit. "for 14 yr I've been getting blood work q month & getting infusions prn" (07/12/2013). Dr. Marin Olp locally, iron infusions done, then erythropoietin deficiency diagnosed: aranesp to be started as of 09/2013.   .  H/O hiatal hernia    .  Migraine syndrome      "not as often anymore; used to be ~ q wk" (07/12/2013)   .  Tension headache, chronic    .  DDD (degenerative disc disease)      lumbar and cervical.   .  Osteoarthritis      "severe; progressing fast" (07/12/2013)   .  Chronic lower back pain    .  Anxiety      panic attacks   .  Nephrolithiasis      "passed all on my own or they are still in there" (07/12/2013)   .  Pyelonephritis      "several times over the last 30 yr" (07/12/2013)   .  Diastolic congestive heart failure    .  COPD (chronic obstructive pulmonary disease)    .  Pulmonary embolism  07/2013     coumadin needed x 6 mo; Dx at [facility] with very small peripheral upper lobe pe 07/2013   .  Pleural plaque with presence of asbestos  07/22/2013   .  BPPV (benign paroxysmal positional vertigo)  12/16/2012   .  RBBB (right bundle branch block)       MEDICATIONS:  Scheduled Meds: . amitriptyline  100 mg Oral QHS  . atorvastatin  20 mg Oral q1800  . ciprofloxacin  400 mg Intravenous BID  . isosorbide mononitrate  30 mg Oral Daily  . nebivolol  5 mg Oral Daily  . pantoprazole  40 mg Oral Q0600  . pregabalin  200 mg Oral Daily   And  . pregabalin  400 mg Oral QHS   Continuous Infusions: . sodium chloride 20 mL/hr at 07/19/14 0700   PRN Meds:.acetaminophen, acetaminophen, albuterol, ALPRAZolam, nitroGLYCERIN, ondansetron (ZOFRAN) IV, ondansetron, oxyCODONE-acetaminophen, traZODone  ALLERGIES:  Allergies  Allergen Reactions  . Penicillins Itching, Swelling and Rash    Tolerated Cefepime in ED.     Family History  Problem Relation Age of Onset  . Arthritis Mother   . Kidney disease Mother   . Heart disease Father   . Stroke Father   . Hypertension Father   . Diabetes Father      Past Surgical History  Procedure Laterality Date  . Appendectomy  1960  . Total abdominal hysterectomy  1974  . Tendon release  1996    Right forearm and hand  . Knee surgery  2005  . Heel spur surgery Left 2008  . Plantar fascia release Left 2008  . Axillary surgery Left 1978    Multiple "lump" in armpit per pt  . Coccyx removal  1972  . Cardiac catheterization  01/2013    nonobstructive CAD, EF 55-60%  . Transthoracic echocardiogram  01/2013; 04/2014    2014--NORMAL.  2015--focal basal septal hypertrophy, EF 55-60%, grade I diast dysfxn, mild LAE.    . Dilation and curettage of uterus  ? 1970's  . Eye surgery Left 2012-2013    "injections for ~ 1 yr; don't really know what for" (07/12/2013)  . Spirometry  04/25/14    In hosp for acute asthma/COPD flare: mixed obstructive and restrictive lung disease. The FEV1 is severely reduced at 45% predicted.  FEV1 signif decreased compared to prior spirometry 07/23/13.    History   Social History  . Marital Status: Married    Spouse Name: N/A    Number of Children: N/A  . Years of Education: N/A   Occupational History  . Not on file.   Social History Main Topics  . Smoking status: Never Smoker   . Smokeless tobacco: Never Used     Comment: never used tobacco  . Alcohol Use: No  . Drug Use: No  . Sexual Activity: Not Currently   Other Topics Concern  . Not on file   Social History Narrative   Widowed, 2 sons.  Relocated to [place] 09/2012 to be closer to her son who has MS.   Husband d 2015--mesothelioma.   Occupation: former Pharmacist, hospital.   Education: masters degree level.   No T/A/Ds.     PHYSICAL EXAMINATION:   Filed Vitals:   07/19/14 1055  BP: 117/60  Pulse: 81  Temp:   Resp: 9   Filed Weights   07/18/14 0015 07/18/14 0445  07/19/14 0446  Weight: 167 lb 8.8 oz (76 kg) 167 lb 8.8 oz (76 kg) 173 lb 8 oz (78.55 kg)    68 year old white female, chronically ill appearing, in no acute distress, alert and oriented to time, place and date.  General well-developed and well-nourished  HEENT: Normocephalic, atraumatic.Sclera anicteric.Oral cavity without thrush or lesions. Neck:  Supple. No thyromegaly,no cervical or supraclavicular adenopathy  Lungs: Clear to auscultation. No wheezing, rhonchi or rales. Cardiac: Regular rate and rhythm, no murmur,rubs or gallops Abdomen: Soft nontender,bowel sounds x4. Nohepatosplenomegaly Extremities: No clubbing cyanosis or edema. No petechial rash Neuro: No focal or motor deficits  LABORATORY/RADIOLOGY DATA:   Recent Labs Lab 07/17/14 1804 07/17/14 1947 07/18/14 1215 07/19/14 0710  WBC 6.4 5.3 6.6 5.7  HGB 5.1* 5.8* 8.2* 8.5*  HCT 15.5* 17.6* 25.2* 25.8*  PLT 128* 139* 154 150  MCV 94.5 95.7 87.5 87.8  MCH 31.1 31.5 28.5 28.9  MCHC 32.9 33.0 32.5 32.9  RDW 13.8 13.8 16.8* 16.5*  LYMPHSABS 2.1  --   --  1.7  MONOABS 0.5  --   --  0.6  EOSABS 0.1  --   --  0.1  BASOSABS 0.0  --   --  0.0    CMP    Recent Labs Lab 07/17/14 1804 07/18/14 1215 07/19/14 0315  NA 143 143 142  K 3.8 3.5* 3.3*  CL 106 103 105  CO2 24 28 28   GLUCOSE 86 100* 92  BUN 25* 13 11  CREATININE 0.91 0.80 0.76  CALCIUM  7.5* 8.3* 8.5  AST 18  --   --   ALT 13  --   --   ALKPHOS 50  --   --   BILITOT <0.2*  --   --         Component Value Date/Time   BILITOT <0.2* 07/17/2014 1804        Component Value Date/Time   ESRSEDRATE 7 07/24/2013 0415     Recent Labs Lab 07/17/14 2335 07/18/14 1215 07/19/14 0315  INR >10.00* 1.26 1.16      Urinalysis    Component Value Date/Time   COLORURINE YELLOW 07/17/2014 1838   APPEARANCEUR CLEAR 07/17/2014 1838   LABSPEC 1.014 07/17/2014 1838   PHURINE 6.0 07/17/2014 1838   GLUCOSEU NEGATIVE 07/17/2014 1838   HGBUR NEGATIVE 07/17/2014  1838   BILIRUBINUR NEGATIVE 07/17/2014 1838   BILIRUBINUR neg 07/15/2014 1245   KETONESUR NEGATIVE 07/17/2014 1838   PROTEINUR NEGATIVE 07/17/2014 1838   PROTEINUR neg 07/15/2014 1245   UROBILINOGEN 0.2 07/17/2014 1838   UROBILINOGEN 0.2 07/15/2014 1245   NITRITE NEGATIVE 07/17/2014 1838   NITRITE positive 07/15/2014 1245   LEUKOCYTESUR NEGATIVE 07/17/2014 1838    Drugs of Abuse     Component Value Date/Time   LABOPIA NONE DETECTED 01/30/2013 0540   COCAINSCRNUR NONE DETECTED 01/30/2013 0540   LABBENZ POSITIVE* 01/30/2013 0540   AMPHETMU NONE DETECTED 01/30/2013 0540   THCU NONE DETECTED 01/30/2013 0540   LABBARB POSITIVE* 01/30/2013 0540     Liver Function Tests:  Recent Labs Lab 07/17/14 1804  AST 18  ALT 13  ALKPHOS 50  BILITOT <0.2*  PROT 5.0*  ALBUMIN 2.7*   No results found for this basename: LIPASE, AMYLASE,  in the last 168 hours No results found for this basename: AMMONIA,  in the last 168 hours  CBG:  Recent Labs Lab 07/18/14 0623 07/18/14 1201 07/18/14 2340 07/19/14 0609  GLUCAP 105* 105* 110* 80     Radiology Studies:  Ct Abdomen Pelvis Wo Contrast  07/18/2014   CLINICAL DATA:  Syncope, nausea. Check for retroperitoneal hematoma.  EXAM: CT ABDOMEN AND PELVIS WITHOUT CONTRAST  TECHNIQUE: Multidetector CT imaging of the abdomen and pelvis was performed following the standard protocol without IV contrast.  COMPARISON:  CT of the chest Apr 25, 2014  FINDINGS: LUNG BASES: Calcified pleural plaques and pleural thickening. The heart appears mildly enlarged, pericardium is nonsuspicious.  KIDNEYS/BLADDER: Kidneys are orthotopic, demonstrating normal size and morphology. Punctate right upper pole nephrolithiasis. No hydronephrosis; limited assessment for renal masses on this nonenhanced examination. The unopacified ureters are normal in course and caliber. Urinary bladder is partially distended and unremarkable.  SOLID ORGANS: The liver, spleen, gallbladder, pancreas and adrenal  glands are unremarkable for this non-contrast examination.  GASTROINTESTINAL TRACT: The stomach, small and large bowel are normal in course and caliber without inflammatory changes, the sensitivity may be decreased by lack of enteric contrast. The appendix is not discretely identified, however there are no inflammatory changes in the right lower quadrant.  PERITONEUM/RETROPERITONEUM: No intraperitoneal free fluid nor free air. Aortoiliac vessels are normal in course and caliber, lower calcific atherosclerosis. No lymphadenopathy by CT size criteria. Status post hysterectomy. Phleboliths in the pelvis.  SOFT TISSUES/ OSSEOUS STRUCTURES: Grade 1 L5-S1 anterolisthesis without pars interarticularis defect, mild L5-S1 neural foraminal narrowing. Bilateral gluteal injection granulomas.  IMPRESSION: No acute intra-abdominal or pelvic process; specifically no retroperitoneal hematoma.  Calcified pleural plaques and pleural thickening, please see CT of chest Apr 25, 2014 for further  description.   Electronically Signed   By: Elon Alas   On: 07/18/2014 02:35   Dg Chest 2 View  07/17/2014   CLINICAL DATA:  Loss of consciousness.  EXAM: CHEST  2 VIEW  COMPARISON:  04/27/2014  FINDINGS: Heart size and pulmonary vascularity are normal. There are extensive pleural calcifications bilaterally, stable. No infiltrates or effusions. No acute osseous abnormality.  IMPRESSION: No acute abnormalities.   Electronically Signed   By: Rozetta Nunnery M.D.   On: 07/17/2014 19:12   Dg Lumbar Spine 2-3 Views  07/18/2014   CLINICAL DATA:  Low back pain.  EXAM: LUMBAR SPINE - 2-3 VIEW  COMPARISON:  Bone window images from CT abdomen and pelvis earlier same date. Lumbar spine x-rays 12/08/2013.  FINDINGS: Five non rib-bearing lumbar vertebrae. Grade 1 spondylolisthesis of L5 on S1 approximating 8 mm, unchanged from the prior examination, due to severe facet degenerative changes at this level. Mild disc space narrowing at L3-4 and L4-5,  unchanged. Remaining disc spaces well preserved. Facet degenerative changes at L3-4 and L4-5, unchanged. Visualized sacroiliac joints intact.  IMPRESSION: 1. No acute osseous abnormality. 2. Degenerative grade 1 spondylolisthesis of L5 on S1 approximating 8 mm, stable since December, 2014. 3. Stable mild degenerative disc disease at L3-4 and L4-5. 4. Stable facet degenerative changes at L3-4 and L4-5.   Electronically Signed   By: Evangeline Dakin M.D.   On: 07/18/2014 07:58   Dg Pelvis 1-2 Views  07/18/2014   CLINICAL DATA:  Low back and pelvis pain.  EXAM: PELVIS - 1-2 VIEW  COMPARISON:  Lumbar spine series of today's date.  FINDINGS: The bony pelvis is adequately mineralized. There is no acute fracture. The observed portions of the sacrum are unremarkable. There are degenerative facet joint changes at L5-S1. There is mild narrowing of the hip joints greater on the right than on the left.  IMPRESSION: There is no acute bony abnormality of the pelvis.   Electronically Signed   By: David  Martinique   On: 07/18/2014 07:54   Ct Head Wo Contrast  07/17/2014   CLINICAL DATA:  Syncopal episodes with loss of consciousness, dizziness, history of migraines  EXAM: CT HEAD WITHOUT CONTRAST  TECHNIQUE: Contiguous axial images were obtained from the base of the skull through the vertex without intravenous contrast.  COMPARISON:  01/30/2013  FINDINGS: Calvarium is intact. No significant inflammatory change in the visualized portions of the paranasal sinuses.  Mild age-related atrophy. No evidence of hemorrhage or extra-axial fluid. No evidence of infarct or hydrocephalus.  There is a suggestion of an extra-axial mass near the right petrous bone measuring about 13 mm. This appears contiguous however within overlying gyrus and an identical appearance is identified on 12/14/2012.  IMPRESSION: Suggestion of mass arising off the right petrous bone is likely due to volume averaging with a prominent overlying gyrus of right  cerebellum, as the appearance is very similar to 12/14/2012. MRI should be considered to confirm this however.   Electronically Signed   By: Skipper Cliche M.D.   On: 07/17/2014 19:55       ASSESSMENT AND PLAN:  History of pulmonary embolism  -Antticoagulation on hold due to bleeding issues She was on coumadin daily. supratherapeutic INR was found, with 2 units of FFP and Vit K x2  with good results. Currently, INR subtherapeutic. Platelets are normal. Will consider IVC filter placement to reduce the risk of embolic events and  Bleeding due to anticoagulants..  Anemia of Iron deficiency and  B12 deficiency, now associated with acute blood loss +- malignancy Baseline hemoglobin reported at 10 or higher  Guaiac was positive She received transfusion of 2 units of blood with good response. GI consult was obtained Mass in antrum and gastritis were noted in today's endoscopy. Biopsies were obtained, results pending. Will await for results to proceed with recommendations. Will follow labs.  Petrous bone suspicious lesion stable since 2013  MRI to be performed when clinically stable to better define any progress of abnormal finding.  UTI  currently on IV Cipro  Full Code  Other medical issues as per admitting team   **Disclaimer: This note was dictated with voice recognition software. Similar sounding words can inadvertently be transcribed and this note may contain transcription errors which may not have been corrected upon publication of note.**  WERTMAN,SARA E, PA-C 07/19/2014, 11:56 AM   Note was inadvertently locked when I was reviewing oncology's note on our patient. I am not supervising attending. No obese signed by myself in order to hopefully unlock the note and allow oncology attending to review/make changes to &. Sign.

## 2014-07-19 NOTE — Interval H&P Note (Signed)
History and Physical Interval Note:  07/19/2014 9:48 AM  Linda [name]  has presented today for surgery, with the diagnosis of gi bleed  The various methods of treatment have been discussed with the patient and family. After consideration of risks, benefits and other options for treatment, the patient has consented to  Procedure(s): ESOPHAGOGASTRODUODENOSCOPY  (N/A) as a surgical intervention .  The patient's history has been reviewed, patient examined, no change in status, stable for surgery.  I have reviewed the patient's chart and labs.  Questions were answered to the patient's satisfaction.     Pricilla Riffle. Fuller Plan MD

## 2014-07-19 NOTE — Telephone Encounter (Signed)
Patient was notified of results. Patient expressed understanding. 

## 2014-07-19 NOTE — Progress Notes (Signed)
Report called pt transferring to [id number] via w/c with belongings,.

## 2014-07-19 NOTE — Anesthesia Preprocedure Evaluation (Addendum)
Anesthesia Evaluation  Patient identified by MRN, date of birth, ID band Patient awake    Reviewed: Allergy & Precautions, H&P , NPO status , Patient's Chart, lab work & pertinent test results  History of Anesthesia Complications Negative for: history of anesthetic complications  Airway Mallampati: II TM Distance: >3 FB Neck ROM: Full    Dental  (+) Edentulous Upper   Pulmonary asthma , sleep apnea and Continuous Positive Airway Pressure Ventilation , COPD oxygen dependent,          Cardiovascular hypertension, Pt. on medications and Pt. on home beta blockers + angina + CAD and +CHF + dysrhythmias Rhythm:Regular     Neuro/Psych  Headaches, PSYCHIATRIC DISORDERS Anxiety Depression  Neuromuscular disease    GI/Hepatic hiatal hernia, Anemia with possible gi bleed   Endo/Other  neg diabetesMorbid obesity  Renal/GU Recurrent uti     Musculoskeletal  (+) Fibromyalgia -  Abdominal   Peds  Hematology  (+) anemia ,   Anesthesia Other Findings   Reproductive/Obstetrics                          Anesthesia Physical Anesthesia Plan  ASA: III  Anesthesia Plan: MAC   Post-op Pain Management:    Induction: Intravenous  Airway Management Planned: Natural Airway  Additional Equipment: None  Intra-op Plan:   Post-operative Plan:   Informed Consent: I have reviewed the patients History and Physical, chart, labs and discussed the procedure including the risks, benefits and alternatives for the proposed anesthesia with the patient or authorized representative who has indicated his/her understanding and acceptance.   Dental advisory given  Plan Discussed with: CRNA and Surgeon  Anesthesia Plan Comments:         Anesthesia Quick Evaluation

## 2014-07-19 NOTE — Transfer of Care (Signed)
Immediate Anesthesia Transfer of Care Note  Patient: Linda [name]  Procedure(s) Performed: Procedure(s): ESOPHAGOGASTRODUODENOSCOPY  (N/A)  Patient Location: PACU and Endoscopy Unit  Anesthesia Type:MAC  Level of Consciousness: awake, alert  and oriented  Airway & Oxygen Therapy: Patient Spontanous Breathing  Post-op Assessment: Report given to PACU RN  Post vital signs: Reviewed and stable  Complications: No apparent anesthesia complications

## 2014-07-19 NOTE — Consult Note (Cosign Needed)
Reason for Consult: Need for Plavix/syncope history GI bleed Referring Physician: [facility]  Linda [name] is an 68 y.o. female.  HPI: Patient is 68 year old female with past medical history significant for moderate mid RCA stenosis, hypertension, hypercholesteremia, diabetes mellitus, history of asbestosis, history of bronchial asthma, sleep apnea, depression, fibromyalgia, had this hernia, history of pulmonary embolism in the past, history of congestive heart failure secondary to preserved LV systolic function, thrombocytopenia, was admitted because of recurrent syncopal episode hypotension and marked anemia with hemoglobin of 5.1. Patient states she noticed a black stool once on Saturday felt dizzy weak and had recurrent falls. Denies any chest pain nausea vomiting diaphoresis. Denies any palpitation. States was recently started on Cipro for UTI and her Coumadin dose was doubled from 5 mg to 10 mg as her INR was noted into therapeutic range. Patient also was noted to have INR above 10. Patient had stress test in July of last year which showed the evidence of ischemia with normal EF. Patient never followed up in the office as she was taking care of her husband who recently passed away. States has been taking Plavix meloxicam and Coumadin chronically.    Past Surgical History  Procedure Laterality Date  . Appendectomy  1960  . Total abdominal hysterectomy  1974  . Tendon release  1996    Right forearm and hand  . Knee surgery  2005  . Heel spur surgery Left 2008  . Plantar fascia release Left 2008  . Axillary surgery Left 1978    Multiple "lump" in armpit per pt  . Coccyx removal  1972  . Cardiac catheterization  01/2013    nonobstructive CAD, EF 55-60%  . Transthoracic echocardiogram  01/2013; 04/2014    2014--NORMAL.  2015--focal basal septal hypertrophy, EF 55-60%, grade I diast dysfxn, mild LAE.    . Dilation and curettage of uterus  ? 1970's  . Eye surgery Left 2012-2013   "injections for ~ 1 yr; don't really know what for" (07/12/2013)  . Spirometry  04/25/14    In hosp for acute asthma/COPD flare: mixed obstructive and restrictive lung disease. The FEV1 is severely reduced at 45% predicted.  FEV1 signif decreased compared to prior spirometry 07/23/13.    Family History  Problem Relation Age of Onset  . Arthritis Mother   . Kidney disease Mother   . Heart disease Father   . Stroke Father   . Hypertension Father   . Diabetes Father     Social History:  reports that she has never smoked. She has never used smokeless tobacco. She reports that she does not drink alcohol or use illicit drugs.  Allergies:  Allergies  Allergen Reactions  . Penicillins Itching, Swelling and Rash    Tolerated Cefepime in ED.    Medications: I have reviewed the patient's current medications.  Results for orders placed during the hospital encounter of 07/17/14 (from the past 48 hour(s))  CBC WITH DIFFERENTIAL     Status: Abnormal   Collection Time    07/17/14  6:04 PM      Result Value Ref Range   WBC 6.4  4.0 - 10.5 K/uL   RBC 1.64 (*) 3.87 - 5.11 MIL/uL   Hemoglobin 5.1 (*) 12.0 - 15.0 g/dL   Comment: CRITICAL RESULT CALLED TO, READ BACK BY AND VERIFIED WITH:     WYATT,J RN 07/17/14 1923 WOOTEN,K   HCT 15.5 (*) 36.0 - 46.0 %   MCV 94.5  78.0 - 100.0 fL  MCH 31.1  26.0 - 34.0 pg   MCHC 32.9  30.0 - 36.0 g/dL   RDW 13.8  11.5 - 15.5 %   Platelets 128 (*) 150 - 400 K/uL   Neutrophils Relative % 57  43 - 77 %   Neutro Abs 3.6  1.7 - 7.7 K/uL   Lymphocytes Relative 33  12 - 46 %   Lymphs Abs 2.1  0.7 - 4.0 K/uL   Monocytes Relative 8  3 - 12 %   Monocytes Absolute 0.5  0.1 - 1.0 K/uL   Eosinophils Relative 2  0 - 5 %   Eosinophils Absolute 0.1  0.0 - 0.7 K/uL   Basophils Relative 1  0 - 1 %   Basophils Absolute 0.0  0.0 - 0.1 K/uL  COMPREHENSIVE METABOLIC PANEL     Status: Abnormal   Collection Time    07/17/14  6:04 PM      Result Value Ref Range   Sodium 143   137 - 147 mEq/L   Potassium 3.8  3.7 - 5.3 mEq/L   Chloride 106  96 - 112 mEq/L   CO2 24  19 - 32 mEq/L   Glucose, Bld 86  70 - 99 mg/dL   BUN 25 (*) 6 - 23 mg/dL   Creatinine, Ser 0.91  0.50 - 1.10 mg/dL   Calcium 7.5 (*) 8.4 - 10.5 mg/dL   Total Protein 5.0 (*) 6.0 - 8.3 g/dL   Albumin 2.7 (*) 3.5 - 5.2 g/dL   AST 18  0 - 37 U/L   ALT 13  0 - 35 U/L   Alkaline Phosphatase 50  39 - 117 U/L   Total Bilirubin <0.2 (*) 0.3 - 1.2 mg/dL   GFR calc non Af Amer 63 (*) >90 mL/min   GFR calc Af Amer 73 (*) >90 mL/min   Comment: (NOTE)     The eGFR has been calculated using the CKD EPI equation.     This calculation has not been validated in all clinical situations.     eGFR's persistently <90 mL/min signify possible Chronic Kidney     Disease.   Anion gap 13  5 - 15  I-STAT TROPOININ, ED     Status: None   Collection Time    07/17/14  6:34 PM      Result Value Ref Range   Troponin i, poc 0.00  0.00 - 0.08 ng/mL   Comment 3            Comment: Due to the release kinetics of cTnI,     a negative result within the first hours     of the onset of symptoms does not rule out     myocardial infarction with certainty.     If myocardial infarction is still suspected,     repeat the test at appropriate intervals.  URINALYSIS, ROUTINE W REFLEX MICROSCOPIC     Status: None   Collection Time    07/17/14  6:38 PM      Result Value Ref Range   Color, Urine YELLOW  YELLOW   APPearance CLEAR  CLEAR   Specific Gravity, Urine 1.014  1.005 - 1.030   pH 6.0  5.0 - 8.0   Glucose, UA NEGATIVE  NEGATIVE mg/dL   Hgb urine dipstick NEGATIVE  NEGATIVE   Bilirubin Urine NEGATIVE  NEGATIVE   Ketones, ur NEGATIVE  NEGATIVE mg/dL   Protein, ur NEGATIVE  NEGATIVE mg/dL   Urobilinogen, UA 0.2  0.0 - 1.0 mg/dL   Nitrite NEGATIVE  NEGATIVE   Leukocytes, UA NEGATIVE  NEGATIVE   Comment: MICROSCOPIC NOT DONE ON URINES WITH NEGATIVE PROTEIN, BLOOD, LEUKOCYTES, NITRITE, OR GLUCOSE <1000 mg/dL.  POC OCCULT BLOOD,  ED     Status: Abnormal   Collection Time    07/17/14  7:45 PM      Result Value Ref Range   Fecal Occult Bld POSITIVE (*) NEGATIVE  CBC     Status: Abnormal   Collection Time    07/17/14  7:47 PM      Result Value Ref Range   WBC 5.3  4.0 - 10.5 K/uL   RBC 1.84 (*) 3.87 - 5.11 MIL/uL   Hemoglobin 5.8 (*) 12.0 - 15.0 g/dL   Comment: CRITICAL VALUE NOTED.  VALUE IS CONSISTENT WITH PREVIOUSLY REPORTED AND CALLED VALUE.   HCT 17.6 (*) 36.0 - 46.0 %   MCV 95.7  78.0 - 100.0 fL   MCH 31.5  26.0 - 34.0 pg   MCHC 33.0  30.0 - 36.0 g/dL   RDW 13.8  11.5 - 15.5 %   Platelets 139 (*) 150 - 400 K/uL  TYPE AND SCREEN     Status: None   Collection Time    07/17/14  8:52 PM      Result Value Ref Range   ABO/RH(D) A POS     Antibody Screen NEG     Sample Expiration 07/20/2014     Unit Number [id number]     Blood Component Type RED CELLS,LR     Unit division 00     Status of Unit ISSUED,FINAL     Transfusion Status OK TO TRANSFUSE     Crossmatch Result Compatible     Unit Number [id number]     Blood Component Type RED CELLS,LR     Unit division 00     Status of Unit ISSUED,FINAL     Transfusion Status OK TO TRANSFUSE     Crossmatch Result Compatible    HAPTOGLOBIN     Status: None   Collection Time    07/17/14  9:23 PM      Result Value Ref Range   Haptoglobin 132  45 - 215 mg/dL   Comment: Performed at [place] DEHYDROGENASE     Status: Abnormal   Collection Time    07/17/14  9:23 PM      Result Value Ref Range   LDH 258 (*) 94 - 250 U/L  PREPARE RBC (CROSSMATCH)     Status: None   Collection Time    07/17/14 10:00 PM      Result Value Ref Range   Order Confirmation ORDER PROCESSED BY BLOOD BANK    PROTIME-INR     Status: Abnormal   Collection Time    07/17/14 11:35 PM      Result Value Ref Range   Prothrombin Time >90.0 (*) 11.6 - 15.2 seconds   Comment: REPEATED TO VERIFY     RESULTS VERIFIED VIA RECOLLECT   INR >10.00 (*) 0.00 - 1.49    Comment: REPEATED TO VERIFY     RESULTS VERIFIED VIA RECOLLECT     CRITICAL RESULT CALLED TO, READ BACK BY AND VERIFIED WITH:     TSabino Snipes RN [phone] [id number] GREEN R  PREPARE FRESH FROZEN PLASMA     Status: None   Collection Time    07/18/14  1:00 AM      Result Value Ref Range  Unit Number [id number]     Blood Component Type THAWED PLASMA     Unit division 00     Status of Unit REL FROM [facility]     Transfusion Status OK TO TRANSFUSE     Unit Number [id number]     Blood Component Type THAWED PLASMA     Unit division 00     Status of Unit REL FROM [facility]     Transfusion Status OK TO TRANSFUSE     Unit Number [id number]     Blood Component Type THAWED PLASMA     Unit division 00     Status of Unit ISSUED,FINAL     Transfusion Status OK TO TRANSFUSE     Unit Number [id number]     Blood Component Type THAWED PLASMA     Unit division 00     Status of Unit ISSUED,FINAL     Transfusion Status OK TO TRANSFUSE    GLUCOSE, CAPILLARY     Status: Abnormal   Collection Time    07/18/14  6:23 AM      Result Value Ref Range   Glucose-Capillary 105 (*) 70 - 99 mg/dL   Comment 1 Notify RN    GLUCOSE, CAPILLARY     Status: Abnormal   Collection Time    07/18/14 12:01 PM      Result Value Ref Range   Glucose-Capillary 105 (*) 70 - 99 mg/dL   Comment 1 Notify RN     Comment 2 Documented in Chart    PROTIME-INR     Status: Abnormal   Collection Time    07/18/14 12:15 PM      Result Value Ref Range   Prothrombin Time 15.8 (*) 11.6 - 15.2 seconds   INR 1.26  0.00 - 9.02  BASIC METABOLIC PANEL     Status: Abnormal   Collection Time    07/18/14 12:15 PM      Result Value Ref Range   Sodium 143  137 - 147 mEq/L   Potassium 3.5 (*) 3.7 - 5.3 mEq/L   Chloride 103  96 - 112 mEq/L   CO2 28  19 - 32 mEq/L   Glucose, Bld 100 (*) 70 - 99 mg/dL   BUN 13  6 - 23 mg/dL   Creatinine, Ser 0.80  0.50 - 1.10 mg/dL   Calcium 8.3 (*) 8.4 - 10.5 mg/dL   GFR calc non Af Amer 74 (*) >90 mL/min    GFR calc Af Amer 86 (*) >90 mL/min   Comment: (NOTE)     The eGFR has been calculated using the CKD EPI equation.     This calculation has not been validated in all clinical situations.     eGFR's persistently <90 mL/min signify possible Chronic Kidney     Disease.   Anion gap 12  5 - 15  CBC     Status: Abnormal   Collection Time    07/18/14 12:15 PM      Result Value Ref Range   WBC 6.6  4.0 - 10.5 K/uL   RBC 2.88 (*) 3.87 - 5.11 MIL/uL   Hemoglobin 8.2 (*) 12.0 - 15.0 g/dL   Comment: POST TRANSFUSION SPECIMEN   HCT 25.2 (*) 36.0 - 46.0 %   MCV 87.5  78.0 - 100.0 fL   Comment: POST TRANSFUSION SPECIMEN   MCH 28.5  26.0 - 34.0 pg   MCHC 32.5  30.0 - 36.0 g/dL   RDW 16.8 (*) 11.5 - 15.5 %  Platelets 154  150 - 400 K/uL  GLUCOSE, CAPILLARY     Status: Abnormal   Collection Time    07/18/14 11:40 PM      Result Value Ref Range   Glucose-Capillary 110 (*) 70 - 99 mg/dL  BASIC METABOLIC PANEL     Status: Abnormal   Collection Time    07/19/14  3:15 AM      Result Value Ref Range   Sodium 142  137 - 147 mEq/L   Potassium 3.3 (*) 3.7 - 5.3 mEq/L   Chloride 105  96 - 112 mEq/L   CO2 28  19 - 32 mEq/L   Glucose, Bld 92  70 - 99 mg/dL   BUN 11  6 - 23 mg/dL   Creatinine, Ser 0.76  0.50 - 1.10 mg/dL   Calcium 8.5  8.4 - 10.5 mg/dL   GFR calc non Af Amer 85 (*) >90 mL/min   GFR calc Af Amer >90  >90 mL/min   Comment: (NOTE)     The eGFR has been calculated using the CKD EPI equation.     This calculation has not been validated in all clinical situations.     eGFR's persistently <90 mL/min signify possible Chronic Kidney     Disease.   Anion gap 9  5 - 15  PROTIME-INR     Status: None   Collection Time    07/19/14  3:15 AM      Result Value Ref Range   Prothrombin Time 14.8  11.6 - 15.2 seconds   INR 1.16  0.00 - 1.49  GLUCOSE, CAPILLARY     Status: None   Collection Time    07/19/14  6:09 AM      Result Value Ref Range   Glucose-Capillary 80  70 - 99 mg/dL  CBC WITH  DIFFERENTIAL     Status: Abnormal   Collection Time    07/19/14  7:10 AM      Result Value Ref Range   WBC 5.7  4.0 - 10.5 K/uL   RBC 2.94 (*) 3.87 - 5.11 MIL/uL   Hemoglobin 8.5 (*) 12.0 - 15.0 g/dL   HCT 25.8 (*) 36.0 - 46.0 %   MCV 87.8  78.0 - 100.0 fL   MCH 28.9  26.0 - 34.0 pg   MCHC 32.9  30.0 - 36.0 g/dL   RDW 16.5 (*) 11.5 - 15.5 %   Platelets 150  150 - 400 K/uL   Neutrophils Relative % 56  43 - 77 %   Neutro Abs 3.2  1.7 - 7.7 K/uL   Lymphocytes Relative 30  12 - 46 %   Lymphs Abs 1.7  0.7 - 4.0 K/uL   Monocytes Relative 11  3 - 12 %   Monocytes Absolute 0.6  0.1 - 1.0 K/uL   Eosinophils Relative 2  0 - 5 %   Eosinophils Absolute 0.1  0.0 - 0.7 K/uL   Basophils Relative 1  0 - 1 %   Basophils Absolute 0.0  0.0 - 0.1 K/uL    Ct Abdomen Pelvis Wo Contrast  07/18/2014   CLINICAL DATA:  Syncope, nausea. Check for retroperitoneal hematoma.  EXAM: CT ABDOMEN AND PELVIS WITHOUT CONTRAST  TECHNIQUE: Multidetector CT imaging of the abdomen and pelvis was performed following the standard protocol without IV contrast.  COMPARISON:  CT of the chest Apr 25, 2014  FINDINGS: LUNG BASES: Calcified pleural plaques and pleural thickening. The heart appears mildly enlarged, pericardium is nonsuspicious.  KIDNEYS/BLADDER: Kidneys are orthotopic, demonstrating normal size and morphology. Punctate right upper pole nephrolithiasis. No hydronephrosis; limited assessment for renal masses on this nonenhanced examination. The unopacified ureters are normal in course and caliber. Urinary bladder is partially distended and unremarkable.  SOLID ORGANS: The liver, spleen, gallbladder, pancreas and adrenal glands are unremarkable for this non-contrast examination.  GASTROINTESTINAL TRACT: The stomach, small and large bowel are normal in course and caliber without inflammatory changes, the sensitivity may be decreased by lack of enteric contrast. The appendix is not discretely identified, however there are no  inflammatory changes in the right lower quadrant.  PERITONEUM/RETROPERITONEUM: No intraperitoneal free fluid nor free air. Aortoiliac vessels are normal in course and caliber, lower calcific atherosclerosis. No lymphadenopathy by CT size criteria. Status post hysterectomy. Phleboliths in the pelvis.  SOFT TISSUES/ OSSEOUS STRUCTURES: Grade 1 L5-S1 anterolisthesis without pars interarticularis defect, mild L5-S1 neural foraminal narrowing. Bilateral gluteal injection granulomas.  IMPRESSION: No acute intra-abdominal or pelvic process; specifically no retroperitoneal hematoma.  Calcified pleural plaques and pleural thickening, please see CT of chest Apr 25, 2014 for further description.   Electronically Signed   By: Elon Alas   On: 07/18/2014 02:35   Dg Chest 2 View  07/17/2014   CLINICAL DATA:  Loss of consciousness.  EXAM: CHEST  2 VIEW  COMPARISON:  04/27/2014  FINDINGS: Heart size and pulmonary vascularity are normal. There are extensive pleural calcifications bilaterally, stable. No infiltrates or effusions. No acute osseous abnormality.  IMPRESSION: No acute abnormalities.   Electronically Signed   By: Rozetta Nunnery M.D.   On: 07/17/2014 19:12   Dg Lumbar Spine 2-3 Views  07/18/2014   CLINICAL DATA:  Low back pain.  EXAM: LUMBAR SPINE - 2-3 VIEW  COMPARISON:  Bone window images from CT abdomen and pelvis earlier same date. Lumbar spine x-rays 12/08/2013.  FINDINGS: Five non rib-bearing lumbar vertebrae. Grade 1 spondylolisthesis of L5 on S1 approximating 8 mm, unchanged from the prior examination, due to severe facet degenerative changes at this level. Mild disc space narrowing at L3-4 and L4-5, unchanged. Remaining disc spaces well preserved. Facet degenerative changes at L3-4 and L4-5, unchanged. Visualized sacroiliac joints intact.  IMPRESSION: 1. No acute osseous abnormality. 2. Degenerative grade 1 spondylolisthesis of L5 on S1 approximating 8 mm, stable since December, 2014. 3. Stable mild  degenerative disc disease at L3-4 and L4-5. 4. Stable facet degenerative changes at L3-4 and L4-5.   Electronically Signed   By: Evangeline Dakin M.D.   On: 07/18/2014 07:58   Dg Pelvis 1-2 Views  07/18/2014   CLINICAL DATA:  Low back and pelvis pain.  EXAM: PELVIS - 1-2 VIEW  COMPARISON:  Lumbar spine series of today's date.  FINDINGS: The bony pelvis is adequately mineralized. There is no acute fracture. The observed portions of the sacrum are unremarkable. There are degenerative facet joint changes at L5-S1. There is mild narrowing of the hip joints greater on the right than on the left.  IMPRESSION: There is no acute bony abnormality of the pelvis.   Electronically Signed   By: David  Martinique   On: 07/18/2014 07:54   Ct Head Wo Contrast  07/17/2014   CLINICAL DATA:  Syncopal episodes with loss of consciousness, dizziness, history of migraines  EXAM: CT HEAD WITHOUT CONTRAST  TECHNIQUE: Contiguous axial images were obtained from the base of the skull through the vertex without intravenous contrast.  COMPARISON:  01/30/2013  FINDINGS: Calvarium is intact. No significant inflammatory change in the  visualized portions of the paranasal sinuses.  Mild age-related atrophy. No evidence of hemorrhage or extra-axial fluid. No evidence of infarct or hydrocephalus.  There is a suggestion of an extra-axial mass near the right petrous bone measuring about 13 mm. This appears contiguous however within overlying gyrus and an identical appearance is identified on 12/14/2012.  IMPRESSION: Suggestion of mass arising off the right petrous bone is likely due to volume averaging with a prominent overlying gyrus of right cerebellum, as the appearance is very similar to 12/14/2012. MRI should be considered to confirm this however.   Electronically Signed   By: Skipper Cliche M.D.   On: 07/17/2014 19:55    Review of Systems  Constitutional: Negative for chills and weight loss.  HENT: Negative for hearing loss.   Eyes:  Negative for double vision and photophobia.  Respiratory: Negative for cough, hemoptysis, sputum production and shortness of breath.   Cardiovascular: Negative for chest pain, palpitations and orthopnea.  Gastrointestinal: Negative for nausea, vomiting and abdominal pain.  Neurological: Positive for dizziness and weakness. Negative for headaches.   Blood pressure 131/66, pulse 84, temperature 98 F (36.7 C), temperature source Oral, resp. rate 16, height _0  (1.549 m), weight 78.7 kg (173 lb 8 oz), SpO2 98.00%. Physical Exam  Constitutional: She is oriented to person, place, and time.  HENT:  Head: Atraumatic.  Mouth/Throat: No oropharyngeal exudate.  Eyes: Conjunctivae are normal. Pupils are equal, round, and reactive to light. Left eye exhibits discharge. No scleral icterus.  Neck: Normal range of motion. Neck supple. No JVD present. No tracheal deviation present.  Cardiovascular: Normal rate and regular rhythm.   Murmur (Soft systolic murmur noted no S3 gallop) heard. Respiratory: Effort normal and breath sounds normal. No respiratory distress. She has no wheezes. She has no rales.  GI: Bowel sounds are normal. She exhibits no distension. There is no tenderness. There is no rebound.  Musculoskeletal: She exhibits no edema and no tenderness.  Neurological: She is alert and oriented to person, place, and time.    Assessment/Plan:  Status post recurrent syncope probably secondary to orthostatic hypotension Acute on chronic GI bleed precipitated by Coumadin toxicity/chronic Plavix and Cox inhibitor. CAD stable Diabetes mellitus Coronary artery disease with moderate mid RCA stenosis stable Hypertension Hypercholesteremia Depression Fibromyalgia Hiatus hernia History of pulmonary embolism in the past Compensated congestive heart failure secondary to preserved LV systolic function Possible right petrous bone mass Obstructive sleep apnea History of asbestos exposure Plan Agree  with beta blockers and nitrates and holding Plavix. Check lipid panel We'll start low-dose aspirin once okay from GI point of view . Wake Conlee N 07/19/2014, 4:07 PM

## 2014-07-19 NOTE — Anesthesia Postprocedure Evaluation (Signed)
  Anesthesia Post-op Note  Patient: Linda [name]  Procedure(s) Performed: Procedure(s): ESOPHAGOGASTRODUODENOSCOPY  (N/A)  Patient Location: Endoscopy Unit  Anesthesia Type:MAC  Level of Consciousness: awake  Airway and Oxygen Therapy: Patient Spontanous Breathing  Post-op Pain: mild  Post-op Assessment: Post-op Vital signs reviewed, Patient's Cardiovascular Status Stable, Respiratory Function Stable and No signs of Nausea or vomiting  Post-op Vital Signs: Reviewed and stable  Last Vitals:  Filed Vitals:   07/19/14 1300  BP: 131/66  Pulse: 84  Temp: 36.7 C  Resp: 16    Complications: No apparent anesthesia complications

## 2014-07-19 NOTE — H&P (View-Only) (Signed)
[facility] Consult: 12:21 PM 07/18/2014  LOS: 1 day    Referring Provider: anemia.   Primary Care Physician:  Tammi Sou, MD Primary Gastroenterologist:   Althia Forts.  Previous EGD in [place]:  Marin Olp MD    Reason for Consultation:  Anemia and FOBT +   HPI: Linda [name] is a 68 y.o. female.  Multiple issues.  On Plavix for ?? (no cardiac stents, no CVA).  On Coumadin for 2014 PE.  Chronic anemia for "16 years", pernicious anemia per charts. .  Treated with regular infusions of parenteral iron by MDs in [facility] (where she had bone marrow biopsy), and by Dr Marin Olp since relocating to [place] 1.5 years ago. Also on monthly B 12 injections. Chronic MS, spine and leg pain from DDD and from fibromyalgia. Husband died in Mar 20, 2023 and her chronic anxiety/depression is worse since then.  Colon polyp in 2012 of undetermined type but 5 year follow up recommended by GI in [place].   Pt had nose bleed starting Friday lasting through Sunday.  Black stool occurred on Saturday but became brown by Sunday. It tested FOBT+ on Sunday. No nausea, no abdominal pain. Takes mobic BID. No dysphagia.   Admitted last PM with falls and LOC.  Hgb of 5.1 to 5.8.  Transfused 2 units blood. No CBC since transfusion.  hgb was ~ 12 to 13 in last 4 weeks but 10.8 eight weeks ago.  INR > 10 at presentation.  No recheck of INR since given FFP x 2 and  10 Mg IV vitamin K. .  Platelets normally WNL but low of 128 noted yesterday.    Started on PPI drip.  No BMs since arrival.       Past Medical History  Diagnosis Date  . HTN (hypertension)   . Depression   . Recurrent UTI     +hx of hospitalization for pyelonephritis  . Hay fever   . Mixed incontinence urge and stress   . Diverticular disease   . Insomnia     . Fibromyalgia     Patient states dx was around her late 73s but she had sx's for years prior to this.  . Syncope     +Hypotensive; ED visit--Dr. Terrence Dupont did Cath--nonobstructive CAD, EF 55-60%  . Idiopathic angio-edema-urticaria 72014    Angioedema component was very minimal  . 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 Asthma     + asbestososis   . History of pneumonia     hospitalized 12/2011, 03-19-13, and 07/2013 [facility]) for this  . High cholesterol     "I'm on Lipitor cause it will help my heart in some way" (07/12/2013)  . Anginal pain     Nonobstructive CAD 2014  . OSA on CPAP   . Iron deficiency anemia     Hematologist in [place], [place] did extensive w/u; no cause found; failed oral supplement;; gets fairly regular (q56mor so) IV iron  infusions (Venofer  200mg with procrit.  "for 14 yr I've been getting blood work q month & getting infusions prn" (07/12/2013).  Dr. Ennever locally, iron infusions done, then erythropoietin deficiency diagnosed: aranesp to be started as of 09/2013.  . H/O hiatal hernia   . Migraine syndrome     "not as often anymore; used to be ~ q wk" (07/12/2013)  . Tension headache, chronic   . DDD (degenerative disc disease)     lumbar and cervical.   . Osteoarthritis     "severe; progressing fast" (07/12/2013)  . Chronic lower back pain   . Anxiety     panic attacks  . Nephrolithiasis     "passed all on my own or they are still in there" (07/12/2013)  . Pyelonephritis     "several times over the last 30 yr" (07/12/2013)  . Diastolic congestive heart failure   . COPD (chronic obstructive pulmonary disease)   . Pulmonary embolism 07/2013    coumadin needed x 6 mo; Dx at [facility] with very small peripheral upper lobe pe 07/2013    . Pleural plaque with presence of asbestos 07/22/2013  . BPPV (benign paroxysmal positional vertigo) 12/16/2012  . RBBB (right bundle branch block)      Past Surgical History  Procedure Laterality Date  . Appendectomy  1960  . Total abdominal hysterectomy  1974  . Tendon release  1996    Right forearm and hand  . Knee surgery  2005  . Heel spur surgery Left 2008  . Plantar fascia release Left 2008  . Axillary surgery Left 1978    Multiple "lump" in armpit per pt  . Coccyx removal  1972  . Cardiac catheterization  01/2013    nonobstructive CAD, EF 55-60%  . Transthoracic echocardiogram  01/2013; 04/2014    2014--NORMAL.  2015--focal basal septal hypertrophy, EF 55-60%, grade I diast dysfxn, mild LAE.    . Dilation and curettage of uterus  ? 1970's  . Eye surgery Left 2012-2013    "injections for ~ 1 yr; don't really know what for" (07/12/2013)  . Spirometry  04/25/14    In hosp for acute asthma/COPD flare: mixed obstructive and restrictive lung disease. The FEV1 is severely reduced at 45% predicted.  FEV1 signif decreased compared to prior spirometry 07/23/13.    Prior to Admission medications   Medication Sig Start Date End Date Taking? Authorizing Provider  albuterol (PROVENTIL HFA;VENTOLIN HFA) 108 (90 BASE) MCG/ACT inhaler Inhale 2 puffs into the lungs every 6 (six) hours as needed for wheezing or shortness of breath.    Yes Historical Provider, MD  albuterol (PROVENTIL) (2.5 MG/3ML) 0.083% nebulizer solution Take 3 mLs (2.5 mg total) by nebulization every 6 (six) hours as needed for wheezing or shortness of breath. 02/11/14  Yes Philip H McGowen, MD  ALPRAZolam (XANAX) 1 MG tablet Take 0.5-1 mg by mouth 3 (three) times daily as needed for anxiety or sleep.   Yes Historical Provider, MD  amitriptyline (ELAVIL) 100 MG tablet Take 100 mg by mouth at bedtime.   Yes Historical Provider, MD  atorvastatin (LIPITOR) 20 MG tablet Take 1 tablet (20 mg total) by mouth daily at 6 PM. 08/30/13  Yes Philip H McGowen, MD  ciprofloxacin (CIPRO) 500 MG tablet Take 1 tablet (500 mg total) by mouth 2 (two) times daily. 07/15/14  Yes Layne C Weaver, NP   clopidogrel (PLAVIX) 75 MG tablet Take 75 mg by mouth daily.   Yes Historical Provider, MD  furosemide (  LASIX) 20 MG tablet Take 20 mg by mouth daily.   Yes Historical Provider, MD  isosorbide mononitrate (IMDUR) 30 MG 24 hr tablet Take 30 mg by mouth daily.   Yes Historical Provider, MD  meloxicam (MOBIC) 7.5 MG tablet Take 7.5 mg by mouth 2 (two) times daily.  09/20/13  Yes Historical Provider, MD  Milnacipran (SAVELLA) 50 MG TABS tablet Take 50 mg by mouth 2 (two) times daily.   Yes Historical Provider, MD  Multiple Vitamins-Minerals (CENTRUM SILVER) tablet Take 1 tablet by mouth daily.    Yes Historical Provider, MD  nebivolol (BYSTOLIC) 5 MG tablet Take 5 mg by mouth daily.   Yes Historical Provider, MD  nitroGLYCERIN (NITROSTAT) 0.4 MG SL tablet Place 1 tablet (0.4 mg total) under the tongue every 5 (five) minutes x 3 doses as needed for chest pain. 07/19/13  Yes Reyne Dumas, MD  phenazopyridine (PYRIDIUM) 200 MG tablet Take 1 tablet (200 mg total) by mouth 3 (three) times daily as needed for pain. 07/15/14  Yes Irene Pap, NP  pregabalin (LYRICA) 200 MG capsule Take 200-400 mg by mouth 3 (three) times daily. Take 1 capsule in the morning and supper and 2 capsule at bedtime   Yes Historical Provider, MD  traZODone (DESYREL) 50 MG tablet Take 100-200 mg by mouth at bedtime as needed (for insomnia).   Yes Historical Provider, MD  warfarin (COUMADIN) 5 MG tablet Take 10 mg by mouth daily.    Yes Historical Provider, MD    Scheduled Meds: . [START ON 07/21/2014] pantoprazole (PROTONIX) IV  40 mg Intravenous Q12H   Infusions: . sodium chloride Stopped (07/18/14 0000)  . pantoprozole (PROTONIX) infusion 8 mg/hr (07/18/14 0800)   PRN Meds: acetaminophen, acetaminophen, albuterol, furosemide, morphine injection, nitroGLYCERIN, ondansetron (ZOFRAN) IV, ondansetron   Allergies as of 07/17/2014 - Review Complete 07/17/2014  Allergen Reaction Noted  . Penicillins Itching, Swelling, and Rash  10/16/2012    Family History  Problem Relation Age of Onset  . Arthritis Mother   . Kidney disease Mother   . Heart disease Father   . Stroke Father   . Hypertension Father   . Diabetes Father     History   Social History  . Marital Status: Married    Spouse Name: N/A    Number of Children: N/A  . Years of Education: N/A   Occupational History  . Not on file.   Social History Main Topics  . Smoking status: Never Smoker   . Smokeless tobacco: Never Used     Comment: never used tobacco  . Alcohol Use: No  . Drug Use: No  . Sexual Activity: Not Currently   Other Topics Concern  . Not on file   Social History Narrative   Widowed, 2 sons.  Relocated to [place] 09/2012 to be closer to her son who has MS.   Husband d 2015--mesothelioma.   Occupation: former Pharmacist, hospital.   Education: masters degree level.   No T/A/Ds.    REVIEW OF SYSTEMS: Constitutional:  Weight down 10 # in last several weeks.  ENT:  No nose bleeds Pulm:  No cough.  + DOE CV:  No palpitations, no LE edema.  GU:  No hematuria, no frequency GI:  Per HPI Heme:  Per HPI   Transfusions:  Per HPI Neuro:  No headaches, no peripheral tingling or numbness Derm:  No itching, no rash or sores.  Endocrine:  No sweats or chills.  No polyuria or dysuria Immunization:  Not  querried.  Travel:  None beyond local counties in last few months.    PHYSICAL EXAM: Vital signs in last 24 hours: Filed Vitals:   07/18/14 1158  BP:   Pulse:   Temp: 98.5 F (36.9 C)  Resp:    Wt Readings from Last 3 Encounters:  07/18/14 76 kg (167 lb 8.8 oz)  07/08/14 78.472 kg (173 lb)  06/10/14 80.287 kg (177 lb)   General: pale, unwell, comfortable WF Head:  No asymmetry or swelling  Eyes:  No icterus or pallor Ears:  Not HOH  Nose:  No discharge or dry blood Mouth:  Upper full dentures.  Poor dentition below Neck:  No mass, no JVD Lungs:  Clear bil.  No cough or dyspnea Heart: RRR.  No mrg Abdomen:  Soft, ND.  NT.  No HSM  or mass.  No bruits.  Active BS.   Rectal: empty vault, no blood or stool.  No mass   Musc/Skeltl: no erythema or swelling  Extremities:  No CCE  Neurologic:  No tremors, no confusion.  No limb weakness.  Skin:  No telangectasia or rash Tattoos:  none Nodes:  No cervical adenopathy.    Psych:  Depressed, near tears at time.  Blunt, depressed affect.   Intake/Output from previous day: 07/26 0701 - 07/27 0700 In: 1137.5 [I.V.:750; Blood:387.5] Out: 3075 [Urine:3075] Intake/Output this shift: Total I/O In: 175 [Blood:175] Out: 650 [Urine:650]  LAB RESULTS:  Recent Labs  07/17/14 1804 07/17/14 1947  WBC 6.4 5.3  HGB 5.1* 5.8*  HCT 15.5* 17.6*  PLT 128* 139*   BMET Lab Results  Component Value Date   NA 143 07/17/2014   NA 145 07/08/2014   NA 141 04/28/2014   K 3.8 07/17/2014   K 4.0 07/08/2014   K 3.5* 04/28/2014   CL 106 07/17/2014   CL 105 07/08/2014   CL 100 04/28/2014   CO2 24 07/17/2014   CO2 28 07/08/2014   CO2 27 04/28/2014   GLUCOSE 86 07/17/2014   GLUCOSE 90 07/08/2014   GLUCOSE 104* 04/28/2014   BUN 25* 07/17/2014   BUN 19 07/08/2014   BUN 19 04/28/2014   CREATININE 0.91 07/17/2014   CREATININE 1.00 07/08/2014   CREATININE 0.73 04/28/2014   CALCIUM 7.5* 07/17/2014   CALCIUM 9.1 07/08/2014   CALCIUM 8.8 04/28/2014   LFT  Recent Labs  07/17/14 1804  PROT 5.0*  ALBUMIN 2.7*  AST 18  ALT 13  ALKPHOS 50  BILITOT <0.2*   PT/INR Lab Results  Component Value Date   INR >10.00* 07/17/2014   INR 2.8 07/08/2014   INR 2.4 07/01/2014   PROTIME 33.6* 07/08/2014   PROTIME 28.8* 07/01/2014   PROTIME 15.6* 06/17/2014   Hepatitis Panel No results found for this basename: HEPBSAG, HCVAB, HEPAIGM, HEPBIGM,  in the last 72 hours C-Diff No components found with this basename: cdiff   Lipase  No results found for this basename: lipase    Drugs of Abuse     Component Value Date/Time   LABOPIA NONE DETECTED 01/30/2013 0540   COCAINSCRNUR NONE DETECTED 01/30/2013 0540   LABBENZ  POSITIVE* 01/30/2013 0540   AMPHETMU NONE DETECTED 01/30/2013 0540   THCU NONE DETECTED 01/30/2013 0540   LABBARB POSITIVE* 01/30/2013 0540     RADIOLOGY STUDIES: Ct Abdomen Pelvis Wo Contrast 07/18/2014    COMPARISON:  CT of the chest Apr 25, 2014  FINDINGS: LUNG BASES: Calcified pleural plaques and pleural thickening. The heart appears mildly enlarged, pericardium is  nonsuspicious.  KIDNEYS/BLADDER: Kidneys are orthotopic, demonstrating normal size and morphology. Punctate right upper pole nephrolithiasis. No hydronephrosis; limited assessment for renal masses on this nonenhanced examination. The unopacified ureters are normal in course and caliber. Urinary bladder is partially distended and unremarkable.  SOLID ORGANS: The liver, spleen, gallbladder, pancreas and adrenal glands are unremarkable for this non-contrast examination.  GASTROINTESTINAL TRACT: The stomach, small and large bowel are normal in course and caliber without inflammatory changes, the sensitivity may be decreased by lack of enteric contrast. The appendix is not discretely identified, however there are no inflammatory changes in the right lower quadrant.  PERITONEUM/RETROPERITONEUM: No intraperitoneal free fluid nor free air. Aortoiliac vessels are normal in course and caliber, lower calcific atherosclerosis. No lymphadenopathy by CT size criteria. Status post hysterectomy. Phleboliths in the pelvis.  SOFT TISSUES/ OSSEOUS STRUCTURES: Grade 1 L5-S1 anterolisthesis without pars interarticularis defect, mild L5-S1 neural foraminal narrowing. Bilateral gluteal injection granulomas.  IMPRESSION: No acute intra-abdominal or pelvic process; specifically no retroperitoneal hematoma.  Calcified pleural plaques and pleural thickening, please see CT of chest Apr 25, 2014 for further description.   Electronically Signed   By: Elon Alas   On: 07/18/2014 02:35    Dg Lumbar Spine 2-3 Views 07/18/2014 COMPARISON:  Bone window images from CT abdomen and  pelvis earlier same date. Lumbar spine x-rays 12/08/2013.  FINDINGS: Five non rib-bearing lumbar vertebrae. Grade 1 spondylolisthesis of L5 on S1 approximating 8 mm, unchanged from the prior examination, due to severe facet degenerative changes at this level. Mild disc space narrowing at L3-4 and L4-5, unchanged. Remaining disc spaces well preserved. Facet degenerative changes at L3-4 and L4-5, unchanged. Visualized sacroiliac joints intact.  IMPRESSION: 1. No acute osseous abnormality. 2. Degenerative grade 1 spondylolisthesis of L5 on S1 approximating 8 mm, stable since December, 2014. 3. Stable mild degenerative disc disease at L3-4 and L4-5. 4. Stable facet degenerative changes at L3-4 and L4-5.   Electronically Signed   By: Evangeline Dakin M.D.   On: 07/18/2014 07:58   Dg Pelvis 1-2 Views 07/18/2014  COMPARISON:  Lumbar spine series of today's date.  FINDINGS: The bony pelvis is adequately mineralized. There is no acute fracture. The observed portions of the sacrum are unremarkable. There are degenerative facet joint changes at L5-S1. There is mild narrowing of the hip joints greater on the right than on the left.  IMPRESSION: There is no acute bony abnormality of the pelvis.   Electronically Signed   By: David  Martinique   On: 07/18/2014 07:54   Ct Head Wo Contrast 07/17/2014     COMPARISON:  01/30/2013  FINDINGS: Calvarium is intact. No significant inflammatory change in the visualized portions of the paranasal sinuses.  Mild age-related atrophy. No evidence of hemorrhage or extra-axial fluid. No evidence of infarct or hydrocephalus.  There is a suggestion of an extra-axial mass near the right petrous bone measuring about 13 mm. This appears contiguous however within overlying gyrus and an identical appearance is identified on 12/14/2012.  IMPRESSION: Suggestion of mass arising off the right petrous bone is likely due to volume averaging with a prominent overlying gyrus of right cerebellum, as the  appearance is very similar to 12/14/2012. MRI should be considered to confirm this however.   Electronically Signed   By: Skipper Cliche M.D.   On: 07/17/2014 19:55    ENDOSCOPIC STUDIES: 03/2011  Colonoscopy and EGD in [facility] "Polyp, 5 year recall per pt" No ulcers or UGI pathology.   IMPRESSION:   *  Acute on chronic anemia.  Pernicious anemia treated with B12 monthly in setting of severe coagulopathy. Long standing chronic, parenteral iron requiring anemia.  Managed by Dr Marin Olp.  Previous bone marrown biopsies.  Never treated with blood transfusion before these last 24 hours.    *  FOBT + stool.  Dark stools Saturday, subsequently cleared, in setting of epistaxis of Friday - Sunday. Also on daily Mobic, could have NSAID ulcer.  Dr. Marin Olp mentions possible need for Aranesp due to epo level of 6.3 in 11/2013.    *  Chronic Plavix and Coumadin. Hx PE 06/2013, not clear why Plavix on board and neither is pt. Dr Dicie Beam notes say she ought to be on Coumadin for 1.5 to 2 years.   *  Coagulopathy.  Sp FFP and vitamin K  *  Thrombocytopenia.   *  Chronic back and leg pain.  On chronic Mobic  *  Anxiety and depression.   *  OSA.  On CPCP.   *  Chronic pain.     PLAN:     *  CBC and coags just being drawn now.  *  Can pursue EGD once INR is normal.  Will put her on schedule for tomorrow.  *  Switch to BID IV Protonix now.    Azucena Freed  07/18/2014, 12:21 PM Pager: [phone]       Attending physician's note   I have taken a history, examined the patient and reviewed the chart. I agree with the Advanced Practitioner's note, impression and recommendations. INR has corrected to 1.25 and last Hb=8.2. Bleeding from epistaxis. R/O UGI bleed source as well. EGD tomorrow. Chronic B12 and Fe deficiency managed by Dr. Marin Olp.   Ladene Artist, MD [facility]

## 2014-07-19 NOTE — Op Note (Addendum)
[facility] [place], [id number]   ENDOSCOPY PROCEDURE REPORT  PATIENT: Linda [name], Linda [name]  MR#: [id number] BIRTHDATE: March 15, 1946 , 68  yrs. old GENDER: Female ENDOSCOPIST: Ladene Artist, MD, [facility] REFERRED BY:  [facility] PROCEDURE DATE:  07/19/2014 PROCEDURE:  EGD w/ biopsy ASA CLASS:     Class III INDICATIONS:  Melena. MEDICATIONS: MAC sedation, administered by CRNA TOPICAL ANESTHETIC: Cetacaine Spray DESCRIPTION OF PROCEDURE: After the risks benefits and alternatives of the procedure were thoroughly explained, informed consent was obtained.  The Pentax Gastroscope [id number] endoscope was introduced through the mouth and advanced to the second portion of the duodenum. Without limitations.  The instrument was slowly withdrawn as the mucosa was fully examined.    ESOPHAGUS: The mucosa of the esophagus appeared normal. STOMACH: Mild patchy gastritis was found in the gastric body.  A 6 mm submucosal nodule was found in the gastric antrum.  Multiple biopsies were performed.   The stomach otherwise appeared normal.  DUODENUM: The duodenal mucosa showed no abnormalities in the bulb and second portion of the duodenum.  Retroflexed views revealed no abnormalities.   The scope was then withdrawn from the patient and the procedure completed.  COMPLICATIONS: There were no complications.  ENDOSCOPIC IMPRESSION: 1.   Gastritis in the gastric body 2.   Nodule in the gastric antrum; multiple biopsies  RECOMMENDATIONS: 1.  Gastritis is the likely source of acute bleed in setting of INR > 10. 2.  Would avoid use of Plavix and Coumadin together 3.  Avoid all NSAID usage if Coumadin is restarted 4.  No further GI evaluation needed at this time 5.  Await pathology results  eSigned:  Ladene Artist, MD, [facility] 07/19/2014 10:20 AM

## 2014-07-20 ENCOUNTER — Encounter: Payer: Self-pay | Admitting: Gastroenterology

## 2014-07-20 ENCOUNTER — Encounter (HOSPITAL_COMMUNITY): Payer: Self-pay | Admitting: Gastroenterology

## 2014-07-20 DIAGNOSIS — Z7901 Long term (current) use of anticoagulants: Secondary | ICD-10-CM

## 2014-07-20 DIAGNOSIS — D62 Acute posthemorrhagic anemia: Secondary | ICD-10-CM

## 2014-07-20 DIAGNOSIS — K922 Gastrointestinal hemorrhage, unspecified: Secondary | ICD-10-CM

## 2014-07-20 LAB — CBC
HEMATOCRIT: 24.8 % — AB (ref 36.0–46.0)
HEMOGLOBIN: 8 g/dL — AB (ref 12.0–15.0)
MCH: 28.9 pg (ref 26.0–34.0)
MCHC: 32.3 g/dL (ref 30.0–36.0)
MCV: 89.5 fL (ref 78.0–100.0)
Platelets: 167 10*3/uL (ref 150–400)
RBC: 2.77 MIL/uL — ABNORMAL LOW (ref 3.87–5.11)
RDW: 16.9 % — ABNORMAL HIGH (ref 11.5–15.5)
WBC: 13.1 10*3/uL — ABNORMAL HIGH (ref 4.0–10.5)

## 2014-07-20 LAB — FERRITIN: FERRITIN: 1324 ng/mL — AB (ref 10–291)

## 2014-07-20 LAB — BASIC METABOLIC PANEL
ANION GAP: 14 (ref 5–15)
BUN: 12 mg/dL (ref 6–23)
CALCIUM: 8.6 mg/dL (ref 8.4–10.5)
CO2: 24 meq/L (ref 19–32)
CREATININE: 1 mg/dL (ref 0.50–1.10)
Chloride: 104 mEq/L (ref 96–112)
GFR calc Af Amer: 66 mL/min — ABNORMAL LOW (ref >90)
GFR calc non Af Amer: 57 mL/min — ABNORMAL LOW (ref >90)
Glucose, Bld: 93 mg/dL (ref 70–99)
Potassium: 3.8 mEq/L (ref 3.7–5.3)
Sodium: 142 mEq/L (ref 137–147)

## 2014-07-20 LAB — IRON AND TIBC
Iron: 27 ug/dL — ABNORMAL LOW (ref 42–135)
SATURATION RATIOS: 11 % — AB (ref 20–55)
TIBC: 238 ug/dL — ABNORMAL LOW (ref 250–470)
UIBC: 211 ug/dL (ref 125–400)

## 2014-07-20 LAB — RETICULOCYTES
RBC.: 2.92 MIL/uL — ABNORMAL LOW (ref 3.87–5.11)
RETIC COUNT ABSOLUTE: 192.7 10*3/uL — AB (ref 19.0–186.0)
RETIC CT PCT: 6.6 % — AB (ref 0.4–3.1)

## 2014-07-20 LAB — MAGNESIUM: Magnesium: 1.8 mg/dL (ref 1.5–2.5)

## 2014-07-20 MED ORDER — PREGABALIN 50 MG PO CAPS
400.0000 mg | ORAL_CAPSULE | Freq: Every day | ORAL | Status: DC
  Administered 2014-07-20 – 2014-07-25 (×6): 400 mg via ORAL
  Filled 2014-07-20 (×7): qty 8

## 2014-07-20 MED ORDER — PREGABALIN 75 MG PO CAPS: 200.0000 mg | ORAL_CAPSULE | Freq: Every day | ORAL | Status: DC

## 2014-07-20 MED ORDER — PREGABALIN 50 MG PO CAPS
200.0000 mg | ORAL_CAPSULE | Freq: Every day | ORAL | Status: DC
  Administered 2014-07-20 – 2014-07-26 (×7): 200 mg via ORAL
  Filled 2014-07-20 (×7): qty 4

## 2014-07-20 MED ORDER — CIPROFLOXACIN HCL 500 MG PO TABS
500.0000 mg | ORAL_TABLET | Freq: Two times a day (BID) | ORAL | Status: DC
  Administered 2014-07-20: 500 mg via ORAL
  Filled 2014-07-20: qty 1

## 2014-07-20 NOTE — Care Management Note (Signed)
  Page 1 of 1   07/20/2014     11:44:24 AM CARE MANAGEMENT NOTE 07/20/2014  Patient:  [facility]   Account Number:  [id number]  Date Initiated:  07/18/2014  Documentation initiated by:  Marvetta Gibbons  Subjective/Objective Assessment:   Pt admitted with anemia hgb 5.1 and syncope     Action/Plan:   PTA pt lived at home   Anticipated DC Date:     Anticipated DC Plan:        [place]  CM consult      Choice offered to / List presented to:             Status of service:  In process, will continue to follow Medicare Important Message given?  YES (If response is "NO", the following Medicare IM given date fields will be blank) Date Medicare IM given:  07/20/2014 Medicare IM given by:  Magdalen Spatz Date Additional Medicare IM given:   Additional Medicare IM given by:    Discharge Disposition:    Per UR Regulation:  Reviewed for med. necessity/level of care/duration of stay  If discussed at [place] of Stay Meetings, dates discussed:    Comments:

## 2014-07-20 NOTE — Consult Note (Signed)
I saw Mrs. Cu this morning. I PA, Judson Roch, saw her yesterday and left a note. Unfortunately, her note is "locked". I have no idea why this would be. As such, I cannot add an addendum.  She came in with a very high level of Coumadin. Her INR  was over 10. We saw her on July 17, her INR was 2.7.  She was placed on Cipro about 3 days before admission. I don't know if this was the reason for her INR  to go very high. There she was was on Plavix. Personally, I I do not see any issues with her being on Coumadin and Plavix. The Plavix works on platelets and the Coumadin is a blood clotting inhibitor.  Another problem minimal her on Mobic. She is really bad arthritis. She's taken Mobic frequently. She did have episodes of syncope. When she came into the ER, I think her hemoglobin was 5. She was given FFP. She's given transfusions of blood. There she underwent an upper endoscopy. She does have some gastritis.  She had been on Coumadin for a pulmonary embolism. She was because for the 2 years. She had a CT and  Done earlier. This did not show any pulmonary embolism. There she had a Doppler of her legs done earlier. This did not show any thrombus.  She does has a lot of medical issues. She is still trying to get over the death of her husband 4 months ago.  I think she really needs physical therapy. She wants to go home. I do not think she's able to go home right now. I think physical therapy would really help her out. I think that possibly inpatient physical therapy would be something to consider.  Again, she is off Coumadin. She is off Plavix. Her cardiologist recommended baby aspirin. I don't see a problem with this.  She still has some anemia. Her hemoglobin is 8 today. We will see what her iron studies show. We have given her iron in the office.  I had a good fellowship with her. Again, she just has a lot of health issues. She is dealing with a lot. She is on quite a few medications.  I don't think  she needs any IVC filter put in. She does not have any thrombus in her legs. Her hypercoagulable studies are all normal.  We will follow along. Again, we will try to provide some further recommendations.  Lum Keas

## 2014-07-20 NOTE — Clinical Documentation Improvement (Addendum)
Query 1 of 2: Abnormal findings (laboratory, x-ray, MRI/CT scans, and other diagnostic results) are not coded and reported unless the physician indicates their clinical significance. The medical record reflects the following abnormal potassium value.  If possible, please help by clarifying the diagnostic and/or clinical significance of this abnormal potassium value.  Please take into consideration that potassium chloride SA CR tablet 40 mEq was given on 07/19/14 at 11:53.    Component      Potassium  Latest Ref Rng      3.7 - 5.3 mEq/L  07/17/2014      3.8  07/18/2014      3.5 (L)  07/19/2014      3.3 (L)  07/20/2014      3.8   Possible Clinical Conditions?   -Hypokalemia                                -Other Condition               -Not Clinically Significant   Query 2 of 2:  Clinical Indicators:  Per progress note 07/19/14:  "Also has asthma and asbestosis as well as sleep apnea, on chronic O2 on home (2-3 L O2 via [place])." Pt is also on multiple inhalers and nebulizers.    Possible Conditions:  -Chronic Respiratory Failure -Other Condition -Unable to clinically determine  Thank You, Hartley Barefoot ,RN Clinical Documentation Specialist:  [place] Information Management

## 2014-07-20 NOTE — Progress Notes (Addendum)
Progress Note  Linda [name] MWU:[id number] DOB: 1946-10-09 DOA: 07/17/2014 PCP: Tammi Sou, MD  Brief narrative: 68 yo WF PMHx pulmonary embolism on chronic anticoagulation, hypertension, nonobstructive CAD and fibromyalgia. Also has asthma and asbestosis as well as sleep apnea, on chronic O2 on home (2-3 L O2 via [place]). She presented to the emergency department after experiencing a loss of consciousness. She had 3 episodes over the previous 24 hours. Also recent diagnosis of urinary tract infection and was taking Cipro. She also noticed epistaxis over several days and noticed increased bruising as well. Because the patient was so dizzy upon standing the family notified EMS.  In the ER the patient was noted to be orthostatic. Her stool was positive for occult blood. Her hemoglobin was noted to be 5 which was confirmed as a true result. In review of the electronic medical record patient's hemoglobin was 12.8 on 07/08/2014.  She was on Coumadin, Plavix and meloxicam.  CT of the head with incidental finding of nonspecific changes in the petrous bone which may require followup MRI.  HPI/Subjective: Still weak- no further melena, hasnt gotten out of bed yet  Assessment/Plan:    Anemia associated with acute blood loss -Baseline hemoglobin reported as around 10 or higher -Has had chronic anemia for many years and was actually worked up by hematologist in [place] and when her hemoglobin drops below 10 she usually receives transfusion -Status post 2 units PRBC since admission -hgb remains stable -Anemia followed by Dr. Marin Olp -hb stable now, repeat in am    Acute GI bleeding due to gastritis -No further melena since admission -EGD with gastritis and antral mass; path pending -no further NSAIDs (was on Mobic preadmit) -GI recommends no Plavix if needs to remain on Coumadin, await dr.Ennevers input regarding need to resume Coumadin   Antral mass -path pending   Supratherapeutic INR -INR greater than 10 at presentation, was on Cipro for UTI Has received vitamin K x2 as well as 2 units FFP -Followup PT 15.8 with an INR of 1.2   CAD -has some disease/no stent -per dr.Harwani, resume ASA if possible, ok to stop Plavix   History of pulmonary embolism -Anticoagulation on hold -PE in 8/14 -Dr.Ennever following, resume Coumadin      HTN (hypertension), benign -improving -Continue to hold antihypertensive agent    Pleural plaque with presence of asbestos -Followed by pulmonologist as an outpatient    Chronic diastolic CHF, NYHA class 1 -Compensated    Syncope -Seems directly related to symptomatic anemia    Nonspecific changes in the petrous bone -radiologist ntoes this is probably due to volume averaging, and is stable since 2013 but nonetheless suggests MRI for f/u -further workup as outpt    Fibromyalgia syndrome/Polypharmacy/Grief reaction -Patient normally takes Percocet at home per her report-ordered here -per Ennever's office notes pt has been struggling with depression since her husband died earlier this year and endorsing fatigue and increased reports of pain -chaplain support appreciated  Depression -Secondary to the recent death of husband, as well as her multiple medical problems. -continue Paxil 20 mg daily    OSA  -continue nocturnal CPAP  DVT prophylaxis: SCDs  Code Status: Full  Family Communication: none at bedside  Disposition Plan/Expected LOS: home in 1-2days   Consultants: Dr. Charolette Forward (cardiology)  Dr. Luc[facility] Sherrilyn Rist (oncology)    Procedures: EGD  Cultures: None  Antibiotics: None  Objective: Blood pressure 107/45, pulse 72, temperature 98.1 F (36.7  C), temperature source Oral, resp. rate 20, height 5\' 1"  (1.549 m), weight 79.3 kg (174 lb 13.2 oz), SpO2 100.00%.  Intake/Output Summary (Last 24 hours) at 07/20/14 1805 Last data filed at 07/20/14 1749   Gross per 24 hour  Intake    920 ml  Output   1600 ml  Net   -680 ml     Exam: General: AAOx3, flat affect, No acute respiratory distress-very pale appearing  Lungs: Clear to auscultation bilaterally without wheezes or crackles, 2 L  Cardiovascular: Regular rate and rhythm without murmur gallop or rub normal S1 and S2, no peripheral edema or JVD Abdomen: Nontender, nondistended, soft, bowel sounds positive, no rebound, no ascites, no appreciable mass Musculoskeletal: No significant cyanosis, clubbing of bilateral lower extremities   Scheduled Meds:  Scheduled Meds: . amitriptyline  100 mg Oral QHS  . atorvastatin  20 mg Oral q1800  . ciprofloxacin  500 mg Oral BID  . isosorbide mononitrate  30 mg Oral Daily  . nebivolol  5 mg Oral Daily  . pantoprazole  40 mg Oral Q0600  . PARoxetine  20 mg Oral Daily  . pregabalin  200 mg Oral Daily  . pregabalin  400 mg Oral QHS   Data Reviewed: Basic Metabolic Panel:  Recent Labs Lab 07/17/14 1804 07/18/14 1215 07/19/14 0315 07/20/14 0444  NA 143 143 142 142  K 3.8 3.5* 3.3* 3.8  CL 106 103 105 104  CO2 24 28 28 24   GLUCOSE 86 100* 92 93  BUN 25* 13 11 12   CREATININE 0.91 0.80 0.76 1.00  CALCIUM 7.5* 8.3* 8.5 8.6  MG  --   --   --  1.8   Liver Function Tests:  Recent Labs Lab 07/17/14 1804  AST 18  ALT 13  ALKPHOS 50  BILITOT <0.2*  PROT 5.0*  ALBUMIN 2.7*   CBC:  Recent Labs Lab 07/17/14 1804 07/17/14 1947 07/18/14 1215 07/19/14 0710 07/20/14 0444  WBC 6.4 5.3 6.6 5.7 13.1*  NEUTROABS 3.6  --   --  3.2  --   HGB 5.1* 5.8* 8.2* 8.5* 8.0*  HCT 15.5* 17.6* 25.2* 25.8* 24.8*  MCV 94.5 95.7 87.5 87.8 89.5  PLT 128* 139* 154 150 167   BNP (last 3 results)  Recent Labs  09/13/13 1520 04/23/14 1800 04/26/14 0708  PROBNP 262.4* 801.7* 2423.0*   CBG:  Recent Labs Lab 07/18/14 0623 07/18/14 1201 07/18/14 2340 07/19/14 0609  GLUCAP 105* 105* 110* 80    Recent Results (from the past 240 hour(s))    URINE CULTURE     Status: None   Collection Time    07/15/14 12:47 PM      Result Value Ref Range Status   Culture ESCHERICHIA COLI   Final   Colony Count >=100,000 COLONIES/ML   Final   Organism ID, Bacteria ESCHERICHIA COLI   Final    If 7PM-7AM, please contact night-coverage [email or web address] Password [email or web address] 07/20/2014, 6:05 PM   LOS: 3 days   Domenic Polite, MD [phone]

## 2014-07-20 NOTE — Progress Notes (Signed)
Subjective:  Patient denies any chest pain or shortness of breath. Complains of back pain. Hemoglobin remained stable. Patient is off Plavix and Coumadin.  Objective:  Vital Signs in the last 24 hours: Temp:  [97.5 F (36.4 C)-98.1 F (36.7 C)] 98.1 F (36.7 C) (07/29 1502) Pulse Rate:  [70-88] 72 (07/29 1502) Resp:  [18-20] 20 (07/29 1502) BP: (101-114)/(45-54) 107/45 mmHg (07/29 1502) SpO2:  [96 %-100 %] 100 % (07/29 1502) Weight:  [79.3 kg (174 lb 13.2 oz)] 79.3 kg (174 lb 13.2 oz) (07/29 0627)  Intake/Output from previous day: 07/28 0701 - 07/29 0700 In: 1760 [P.O.:960; I.V.:400; IV Piggyback:400] Out: 900 [Urine:900] Intake/Output from this shift: Total I/O In: -  Out: 1000 [Urine:1000]  Physical Exam: Neck: no adenopathy, no carotid bruit, no JVD and supple, symmetrical, trachea midline Lungs: clear to auscultation bilaterally Heart: regular rate and rhythm, S1, S2 normal and Soft systolic murmur noted Abdomen: soft, non-tender; bowel sounds normal; no masses,  no organomegaly Extremities: extremities normal, atraumatic, no cyanosis or edema  Lab Results:  Recent Labs  07/19/14 0710 07/20/14 0444  WBC 5.7 13.1*  HGB 8.5* 8.0*  PLT 150 167    Recent Labs  07/19/14 0315 07/20/14 0444  NA 142 142  K 3.3* 3.8  CL 105 104  CO2 28 24  GLUCOSE 92 93  BUN 11 12  CREATININE 0.76 1.00   No results found for this basename: TROPONINI, CK, MB,  in the last 72 hours Hepatic Function Panel  Recent Labs  07/17/14 1804  PROT 5.0*  ALBUMIN 2.7*  AST 18  ALT 13  ALKPHOS 50  BILITOT <0.2*   No results found for this basename: CHOL,  in the last 72 hours No results found for this basename: PROTIME,  in the last 72 hours  Imaging: Imaging results have been reviewed and No results found.  Cardiac Studies:  Assessment/Plan:  Status post recurrent syncope probably secondary to orthostatic hypotension  Acute on chronic GI bleed precipitated by Coumadin  toxicity/chronic Plavix and Cox inhibitor. CAD stable  Diabetes mellitus  Coronary artery disease with moderate mid RCA stenosis stable  Hypertension  Hypercholesteremia  Depression  Fibromyalgia  Hiatus hernia  History of pulmonary embolism in the past  Compensated congestive heart failure secondary to preserved LV systolic function  Possible right petrous bone mass  Obstructive sleep apnea  History of asbestos exposure Plan Continue present management No cardiac issues at this point Increase ambulation I will sign off his call if needed Followup with me in 2 weeks Start aspirin low-dose if okay from GI point of view   LOS: 3 days    Alnita Aybar N 07/20/2014, 5:56 PM

## 2014-07-20 NOTE — Consult Note (Signed)
Patient Linda [name]      DOB: 28-Sep-1946      LKG:[id number]     Consult Note from the Palliative Medicine Team at [facility] Requested by: Erin Hearing, NP      PCP: Tammi Sou, MD Reason for Consultation: Support and resources for grieving loss of husband Phone Number:[phone]  Assessment of patients Current state: Linda [name] is a pleasant 68 yo female with fall and loss of consciousness treated for acute GI bleed when found with anemia and currently taking Coumadin and Plavix. PMH significant for iron deficiency anemia, asbestosis on chronic oxygen, pulmonary embolism, HTN, CAD, fibromyalgia, asthma.   I spent an hour discussing and listening with Linda [name] in regards to how life has changed for her since her husband has passed. She was married for 51 years and he passed ~4 months ago from mesothelioma. She said that she was told by his doctors at [facility] "that she had to be the strong one" for her husband and children. She says that she always has this in her mind to be strong and she has not been able to grieve. She tells me her daughter tells her she "needs to move on with her life" but that her daughter doesn't understand and has lived in [place] for ~30 years. She says that her son is a mile down the road from her and that he is supportive but internalizes his emotions and she is not able to speak with him. She has friends from church that are supportive. She says that she enjoys her time alone to process and calm her mind. She enjoys sitting on her front porch where she and her husband would sit and remember him. She says that he installated [employer] with asbestos in [place] and this is why he had mesothelioma. He was an avid [employer] cabin crew and enjoyed attending the Masters and they would go to [place] to golf. They belonged to country clubs in [facility] and [place] clubs. We talked about getting her back into golfing or painting. She is open to counseling or support  groups and I will provide her with numbers for hospice for support when she leaves the hospital.    Goals of Care: 1.  Code Status: FULL   2. Scope of Treatment: Continue all available and offered medical interventions.    4. Disposition: Home when stable.    3. Symptom Management:   1. Anxiety/Agitation: Xanax TID prn.  2. Insomnia: Trazodone qhs prn.  3. Pain: Nitroglycerin prn chest pain. Percocet 5-325 mg every 4 hours prn.  4. Fever: Acetaminophen prn.  5. Nausea/Vomiting: Ondansetron prn.   4. Psychosocial: Emotional support provided as she has been emotionally distressed since her husband's passing. She became very restless and fidgets with with her nails and her cup of coffee (lid on and off and readjusting) during conversation. She also would become distracted and lost train of thought at times.   5. Spiritual: Finds comfort in her religion and church. Her pastor is supposed to visit her today she says. Offered support of hospital chaplains which she declines at this time.     ROS: + intermittent anxiety, + insomnia, + generalized pain intermittently    PSH: Past Surgical History  Procedure Laterality Date  . Appendectomy  1960  . Total abdominal hysterectomy  1974  . Tendon release  1996    Right forearm and hand  . Knee surgery  2005  . Heel spur surgery Left  2008  . Plantar fascia release Left 2008  . Axillary surgery Left 1978    Multiple "lump" in armpit per pt  . Coccyx removal  1972  . Cardiac catheterization  01/2013    nonobstructive CAD, EF 55-60%  . Transthoracic echocardiogram  01/2013; 04/2014    2014--NORMAL.  2015--focal basal septal hypertrophy, EF 55-60%, grade I diast dysfxn, mild LAE.    . Dilation and curettage of uterus  ? 1970's  . Eye surgery Left 2012-2013    "injections for ~ 1 yr; don't really know what for" (07/12/2013)  . Spirometry  04/25/14    In hosp for acute asthma/COPD flare: mixed obstructive and restrictive lung disease. The  FEV1 is severely reduced at 45% predicted.  FEV1 signif decreased compared to prior spirometry 07/23/13.  [name] Kitchen Esophagogastroduodenoscopy N/A 07/19/2014    Procedure: ESOPHAGOGASTRODUODENOSCOPY ;  Surgeon: Ladene Artist, MD;  Location: [facility] ENDOSCOPY;  Service: Endoscopy;  Laterality: N/A;   I have reviewed the [place] and SH and  If appropriate update it with new information. Allergies  Allergen Reactions  . Penicillins Itching, Swelling and Rash    Tolerated Cefepime in ED.   Scheduled Meds: . amitriptyline  100 mg Oral QHS  . atorvastatin  20 mg Oral q1800  . ciprofloxacin  500 mg Oral BID  . isosorbide mononitrate  30 mg Oral Daily  . nebivolol  5 mg Oral Daily  . pantoprazole  40 mg Oral Q0600  . PARoxetine  20 mg Oral Daily  . pregabalin  200 mg Oral Daily   And  . pregabalin  400 mg Oral QHS   Continuous Infusions:  PRN Meds:.acetaminophen, acetaminophen, albuterol, ALPRAZolam, nitroGLYCERIN, ondansetron (ZOFRAN) IV, ondansetron, oxyCODONE-acetaminophen, traZODone    BP 114/54  Pulse 70  Temp(Src) 97.5 F (36.4 C) (Oral)  Resp 20  Ht 5\' 1"  (1.549 m)  Wt 79.3 kg (174 lb 13.2 oz)  BMI 33.05 kg/m2  SpO2 99%   PPS: 60%   Intake/Output Summary (Last 24 hours) at 07/20/14 1105 Last data filed at 07/20/14 0600  Gross per 24 hour  Intake   1380 ml  Output    900 ml  Net    480 ml   LBM: 07/17/14                         Physical Exam:  General: NAD, resting in bed, pleasant HEENT: [place]/AT, no JVD, moist mucous membranes Chest: No labored breathing, symmetric CVS: RRR Abdomen: Soft, NT, ND Ext: MAE, no edema, warm to touch Neuro: Awake, alert, oriented x 3, follows commands  Labs: CBC    Component Value Date/Time   WBC 13.1* 07/20/2014 0444   WBC 6.1 07/08/2014 1221   RBC 2.92* 07/20/2014 0900   RBC 2.77* 07/20/2014 0444   RBC 4.13 07/08/2014 1221   HGB 8.0* 07/20/2014 0444   HGB 12.8 07/08/2014 1221   HCT 24.8* 07/20/2014 0444   HCT 38.6 07/08/2014 1221   PLT 167  07/20/2014 0444   PLT 226 07/08/2014 1221   MCV 89.5 07/20/2014 0444   MCV 94 07/08/2014 1221   MCH 28.9 07/20/2014 0444   MCH 31.0 07/08/2014 1221   MCHC 32.3 07/20/2014 0444   MCHC 33.2 07/08/2014 1221   RDW 16.9* 07/20/2014 0444   RDW 12.3 07/08/2014 1221   LYMPHSABS 1.7 07/19/2014 0710   LYMPHSABS 1.5 07/08/2014 1221   MONOABS 0.6 07/19/2014 0710   EOSABS 0.1 07/19/2014 0710   EOSABS  0.1 07/08/2014 1221   BASOSABS 0.0 07/19/2014 0710   BASOSABS 0.0 07/08/2014 1221    BMET    Component Value Date/Time   NA 142 07/20/2014 0444   K 3.8 07/20/2014 0444   CL 104 07/20/2014 0444   CO2 24 07/20/2014 0444   GLUCOSE 93 07/20/2014 0444   BUN 12 07/20/2014 0444   CREATININE 1.00 07/20/2014 0444   CREATININE 0.65 05/27/2013 1706   CALCIUM 8.6 07/20/2014 0444   GFRNONAA 57* 07/20/2014 0444   GFRAA 66* 07/20/2014 0444    CMP     Component Value Date/Time   NA 142 07/20/2014 0444   K 3.8 07/20/2014 0444   CL 104 07/20/2014 0444   CO2 24 07/20/2014 0444   GLUCOSE 93 07/20/2014 0444   BUN 12 07/20/2014 0444   CREATININE 1.00 07/20/2014 0444   CREATININE 0.65 05/27/2013 1706   CALCIUM 8.6 07/20/2014 0444   PROT 5.0* 07/17/2014 1804   ALBUMIN 2.7* 07/17/2014 1804   AST 18 07/17/2014 1804   ALT 13 07/17/2014 1804   ALKPHOS 50 07/17/2014 1804   BILITOT <0.2* 07/17/2014 1804   GFRNONAA 57* 07/20/2014 0444   GFRAA 66* 07/20/2014 0444     Time In Time Out Total Time Spent with Patient Total Overall Time  1000 1110 60min 47min    Greater than 50%  of this time was spent counseling and coordinating care related to the above assessment and plan.  Vinie Sill, NP Palliative Medicine Team Pager # [phone] (M-F 8a-5p) Team Phone # [phone] (Nights/Weekends)

## 2014-07-20 NOTE — Clinical Social Work Note (Signed)
CSW informed by RN of pt's difficulty with grieving for pt's husband who passed four months ago. CSW presented to unit to speak with pt regarding local resources for support. CSW spoke with Palliative NP who stated she has worked with pt and provided resources. Per Palliative NP, pt denied [employer] consult as pt states her Doristine Bosworth will be visiting at bedside.  No other needs were addressed at this time. CSW signing off.   Lubertha Sayres, MSW, [facility] Licensed Clinical Social Worker [phone] and [phone]

## 2014-07-21 DIAGNOSIS — Z515 Encounter for palliative care: Secondary | ICD-10-CM

## 2014-07-21 DIAGNOSIS — I5032 Chronic diastolic (congestive) heart failure: Secondary | ICD-10-CM

## 2014-07-21 LAB — BASIC METABOLIC PANEL
ANION GAP: 12 (ref 5–15)
BUN: 11 mg/dL (ref 6–23)
CO2: 26 mEq/L (ref 19–32)
Calcium: 8.5 mg/dL (ref 8.4–10.5)
Chloride: 105 mEq/L (ref 96–112)
Creatinine, Ser: 0.93 mg/dL (ref 0.50–1.10)
GFR calc Af Amer: 72 mL/min — ABNORMAL LOW (ref >90)
GFR calc non Af Amer: 62 mL/min — ABNORMAL LOW (ref >90)
Glucose, Bld: 106 mg/dL — ABNORMAL HIGH (ref 70–99)
Potassium: 3.7 mEq/L (ref 3.7–5.3)
SODIUM: 143 meq/L (ref 137–147)

## 2014-07-21 LAB — CBC
HEMATOCRIT: 24.6 % — AB (ref 36.0–46.0)
HEMOGLOBIN: 7.7 g/dL — AB (ref 12.0–15.0)
MCH: 28.6 pg (ref 26.0–34.0)
MCHC: 31.3 g/dL (ref 30.0–36.0)
MCV: 91.4 fL (ref 78.0–100.0)
Platelets: 174 10*3/uL (ref 150–400)
RBC: 2.69 MIL/uL — AB (ref 3.87–5.11)
RDW: 16.8 % — AB (ref 11.5–15.5)
WBC: 6.7 10*3/uL (ref 4.0–10.5)

## 2014-07-21 LAB — PREPARE RBC (CROSSMATCH)

## 2014-07-21 MED ORDER — SODIUM CHLORIDE 0.9 % IV SOLN
25.0000 mg | Freq: Once | INTRAVENOUS | Status: AC
  Administered 2014-07-21: 25 mg via INTRAVENOUS
  Filled 2014-07-21: qty 0.5

## 2014-07-21 MED ORDER — SENNOSIDES-DOCUSATE SODIUM 8.6-50 MG PO TABS
1.0000 | ORAL_TABLET | Freq: Two times a day (BID) | ORAL | Status: DC
  Administered 2014-07-21 – 2014-07-23 (×5): 1 via ORAL
  Filled 2014-07-21 (×5): qty 1

## 2014-07-21 MED ORDER — SODIUM CHLORIDE 0.9 % IV SOLN
1500.0000 mg | Freq: Once | INTRAVENOUS | Status: AC
  Administered 2014-07-21: 1500 mg via INTRAVENOUS
  Filled 2014-07-21 (×3): qty 30

## 2014-07-21 MED ORDER — CYANOCOBALAMIN 1000 MCG/ML IJ SOLN
1000.0000 ug | Freq: Once | INTRAMUSCULAR | Status: AC
  Administered 2014-07-21: 1000 ug via INTRAMUSCULAR
  Filled 2014-07-21: qty 1

## 2014-07-21 MED ORDER — METHYLPREDNISOLONE SODIUM SUCC 125 MG IJ SOLR
80.0000 mg | Freq: Once | INTRAMUSCULAR | Status: AC
  Administered 2014-07-21: 80 mg via INTRAVENOUS
  Filled 2014-07-21: qty 1.28

## 2014-07-21 MED ORDER — DARBEPOETIN ALFA-POLYSORBATE 300 MCG/0.6ML IJ SOLN
300.0000 ug | Freq: Once | INTRAMUSCULAR | Status: DC
  Filled 2014-07-21: qty 0.6

## 2014-07-21 MED ORDER — FAMOTIDINE IN NACL 20-0.9 MG/50ML-% IV SOLN
20.0000 mg | Freq: Once | INTRAVENOUS | Status: AC
  Administered 2014-07-21: 20 mg via INTRAVENOUS
  Filled 2014-07-21: qty 50

## 2014-07-21 MED ORDER — FOLIC ACID 1 MG PO TABS
2.0000 mg | ORAL_TABLET | Freq: Every day | ORAL | Status: DC
  Administered 2014-07-21 – 2014-07-26 (×6): 2 mg via ORAL
  Filled 2014-07-21 (×7): qty 2

## 2014-07-21 MED ORDER — LIDOCAINE 5 % EX PTCH
1.0000 | MEDICATED_PATCH | CUTANEOUS | Status: DC
  Administered 2014-07-21 – 2014-07-26 (×6): 1 via TRANSDERMAL
  Filled 2014-07-21 (×7): qty 1

## 2014-07-21 NOTE — Progress Notes (Signed)
PT Cancellation Note  Patient Details Name: Linda [name] MRN: [id number] DOB: 1946-04-17   Cancelled Treatment:    Reason Eval/Treat Not Completed: Patient declined, no reason specified Patient reports she is not feeling well and would like to delay physical therapy evaluation until after she receives additional blood transfusion. Discussed importance of being out of bed and working with therapy to maintain/improve strength and functional mobility. Will follow up tomorrow for evaluation.   [place], [place]   Ellouise Newer 07/21/2014, 3:30 PM

## 2014-07-21 NOTE — Clinical Documentation Improvement (Signed)
Clinical Indicators:  Per progress note 07/19/14:  "Also has asthma and asbestosis as well as sleep apnea, on chronic O2 on home (2-3 L O2 via [place])." Pt is also on multiple inhalers and nebulizers.    Possible Conditions:  -Chronic Respiratory Failure -Other Condition -Unable to clinically determine     Thank You, Erling Conte ,RN Clinical Documentation Specialist:  [place] Information Management

## 2014-07-21 NOTE — Progress Notes (Signed)
Progress Note  Linda [name] EUM:[id number] DOB: Nov 25, 1946 DOA: 07/17/2014 PCP: Tammi Sou, MD  Brief narrative: 68 yo WF PMHx pulmonary embolism on chronic anticoagulation, hypertension, nonobstructive CAD and fibromyalgia. Also has asthma and asbestosis as well as sleep apnea, on chronic O2 on home (2-3 L O2 via [place]). She presented to the emergency department after experiencing a loss of consciousness. She had 3 episodes over the previous 24 hours. Also recent diagnosis of urinary tract infection and was taking Cipro. She also noticed epistaxis over several days and noticed increased bruising as well. Because the patient was so dizzy upon standing the family notified EMS.  In the ER the patient was noted to be orthostatic. Her stool was positive for occult blood. Her hemoglobin was noted to be 5 which was confirmed as a true result. In review of the electronic medical record patient's hemoglobin was 12.8 on 07/08/2014.  She was on Coumadin, Plavix and meloxicam, CT head with nonspecific changes in petrous bone may require FU MRI.  HPI/Subjective: Still weak- no further melena, hasnt gotten out of bed yet -last Bm several days back  Assessment/Plan:    Anemia associated with acute blood loss -Baseline hemoglobin reported as around 10 or higher -Has had chronic anemia for many years and was actually worked up by hematologist in [place]  -s/p 2units PRBC since admission -has chronic Anemia followed by Dr. Marin Olp -hb 7.7 today will transfuse 1 unit PRBC    Acute GI bleeding due to gastritis -No further melena since admission -EGD with gastritis and antral mass; path pending -no further NSAIDs (was on Mobic preadmit) -Coumadin stopped   Antral mass -path pending   Supratherapeutic INR -INR greater than 10 at presentation, was on Cipro for UTI Has received vitamin K x2 as well as 2 units FFP -Followup PT 15.8 with an INR of 1.2   CAD -has non obstructive -per  dr.Harwani, resume ASA if possible, ok to stop Plavix   History of pulmonary embolism -Anticoagulation on hold -PE in 8/14 -Dr.Ennever following     HTN (hypertension), benign -improving -Continue to hold antihypertensive agent    Pleural plaque with presence of asbestos -Followed by pulmonologist as an outpatient    Chronic diastolic CHF, NYHA class 1 -Compensated    Syncope -Seems directly related to symptomatic anemia    Nonspecific changes in the petrous bone -radiologist notes this is probably due to volume averaging, and is stable since 2013 but nonetheless suggests MRI for f/u -further workup as outpt    Fibromyalgia syndrome/Polypharmacy/Grief reaction -Patient normally takes Percocet at home per her report-ordered here -per Ennever's office notes pt has been struggling with depression since her husband died earlier this year and endorsing fatigue and increased reports of pain -chaplain support appreciated  Depression -Secondary to the recent death of husband, as well as her multiple medical problems. -continue Paxil 20 mg daily    OSA  -continue nocturnal CPAP  DVT prophylaxis: SCDs  Code Status: Full  Family Communication: none at bedside  Disposition Plan/Expected LOS: home vs SNF in 1-2days   Consultants: Dr. Charolette Forward (cardiology)  Dr. Lucio Edward [facility] PA Sherrilyn Rist (oncology)    Procedures: EGD  Cultures: None  Antibiotics: None  Objective: Blood pressure 107/49, pulse 67, temperature 97.8 F (36.6 C), temperature source Oral, resp. rate 16, height 5\' 1"  (1.549 m), weight 83.5 kg (184 lb 1.4 oz), SpO2 97.00%.  Intake/Output Summary (Last 24 hours) at 07/21/14 1050 Last  data filed at 07/21/14 0958  Gross per 24 hour  Intake    462 ml  Output   1500 ml  Net  -1038 ml     Exam: General: AAOx3, flat affect, No acute respiratory distress-very pale appearing  Lungs: Clear to auscultation bilaterally without wheezes  or crackles, 2 L  Cardiovascular: Regular rate and rhythm without murmur gallop or rub normal S1 and S2, no peripheral edema or JVD Abdomen: Nontender, nondistended, soft, bowel sounds positive, no rebound, no ascites, no appreciable mass Musculoskeletal: No significant cyanosis, clubbing of bilateral lower extremities   Scheduled Meds:  Scheduled Meds: . amitriptyline  100 mg Oral QHS  . atorvastatin  20 mg Oral q1800  . darbepoetin (ARANESP) injection - NON-DIALYSIS  300 mcg Subcutaneous Once  . famotidine (PEPCID) IV  20 mg Intravenous Once  . folic acid  2 mg Oral Daily  . iron dextran (INFED/DEXFERRUM) infusion  1,500 mg Intravenous Once  . isosorbide mononitrate  30 mg Oral Daily  . lidocaine  1 patch Transdermal Q24H  . nebivolol  5 mg Oral Daily  . pantoprazole  40 mg Oral Q0600  . PARoxetine  20 mg Oral Daily  . pregabalin  200 mg Oral Daily  . pregabalin  400 mg Oral QHS   Data Reviewed: Basic Metabolic Panel:  Recent Labs Lab 07/17/14 1804 07/18/14 1215 07/19/14 0315 07/20/14 0444 07/21/14 0557  NA 143 143 142 142 143  K 3.8 3.5* 3.3* 3.8 3.7  CL 106 103 105 104 105  CO2 24 28 28 24 26   GLUCOSE 86 100* 92 93 106*  BUN 25* 13 11 12 11   CREATININE 0.91 0.80 0.76 1.00 0.93  CALCIUM 7.5* 8.3* 8.5 8.6 8.5  MG  --   --   --  1.8  --    Liver Function Tests:  Recent Labs Lab 07/17/14 1804  AST 18  ALT 13  ALKPHOS 50  BILITOT <0.2*  PROT 5.0*  ALBUMIN 2.7*   CBC:  Recent Labs Lab 07/17/14 1804 07/17/14 1947 07/18/14 1215 07/19/14 0710 07/20/14 0444 07/21/14 0557  WBC 6.4 5.3 6.6 5.7 13.1* 6.7  NEUTROABS 3.6  --   --  3.2  --   --   HGB 5.1* 5.8* 8.2* 8.5* 8.0* 7.7*  HCT 15.5* 17.6* 25.2* 25.8* 24.8* 24.6*  MCV 94.5 95.7 87.5 87.8 89.5 91.4  PLT 128* 139* 154 150 167 174   BNP (last 3 results)  Recent Labs  09/13/13 1520 04/23/14 1800 04/26/14 0708  PROBNP 262.4* 801.7* 2423.0*   CBG:  Recent Labs Lab 07/18/14 0623 07/18/14 1201  07/18/14 2340 07/19/14 0609  GLUCAP 105* 105* 110* 80    Recent Results (from the past 240 hour(s))  URINE CULTURE     Status: None   Collection Time    07/15/14 12:47 PM      Result Value Ref Range Status   Culture ESCHERICHIA COLI   Final   Colony Count >=100,000 COLONIES/ML   Final   Organism ID, Bacteria ESCHERICHIA COLI   Final    If 7PM-7AM, please contact night-coverage [email or web address] Password [email or web address] 07/21/2014, 10:50 AM   LOS: 4 days   Domenic Polite, MD [phone]

## 2014-07-21 NOTE — Progress Notes (Signed)
Progress Note from the Palliative Medicine Team at [facility]: I gave Linda [name] information for  [place] [facility] [place] [facility] grief counseling services. Also called and wrote out programs that [place] have coming up and a list of suggested reading to help with grieving in which I left at bedside. We were not able to discuss very much today as we kept being interrupted by visitors and care needs. I offered emotional support and answered her questions.    Objective: Allergies  Allergen Reactions  . Penicillins Itching, Swelling and Rash    Tolerated Cefepime in ED.   Scheduled Meds: . amitriptyline  100 mg Oral QHS  . atorvastatin  20 mg Oral q1800  . darbepoetin (ARANESP) injection - NON-DIALYSIS  300 mcg Subcutaneous Once  . folic acid  2 mg Oral Daily  . iron dextran (INFED/DEXFERRUM) infusion  1,500 mg Intravenous Once  . isosorbide mononitrate  30 mg Oral Daily  . lidocaine  1 patch Transdermal Q24H  . nebivolol  5 mg Oral Daily  . pantoprazole  40 mg Oral Q0600  . PARoxetine  20 mg Oral Daily  . pregabalin  200 mg Oral Daily  . pregabalin  400 mg Oral QHS  . senna-docusate  1 tablet Oral BID   Continuous Infusions:  PRN Meds:.acetaminophen, acetaminophen, albuterol, ALPRAZolam, nitroGLYCERIN, ondansetron (ZOFRAN) IV, ondansetron, oxyCODONE-acetaminophen, traZODone  BP 107/49  Pulse 67  Temp(Src) 97.8 F (36.6 C) (Oral)  Resp 16  Ht 5\' 1"  (1.549 m)  Wt 83.5 kg (184 lb 1.4 oz)  BMI 34.80 kg/m2  SpO2 97%   PPS: 60%     Intake/Output Summary (Last 24 hours) at 07/21/14 1347 Last data filed at 07/21/14 1115  Gross per 24 hour  Intake    462 ml  Output   1900 ml  Net  -1438 ml      LBM: 07/07/14      Physical Exam:  General: NAD, resting in bed, pleasant  HEENT: [place]/AT, no JVD, moist mucous membranes  Chest: No labored breathing, symmetric  CVS: RRR  Abdomen: Soft, NT, ND  Ext: MAE, no edema, warm to touch  Neuro:  Awake, alert, oriented x 3, follows commands   Labs: CBC    Component Value Date/Time   WBC 6.7 07/21/2014 0557   WBC 6.1 07/08/2014 1221   RBC 2.69* 07/21/2014 0557   RBC 2.92* 07/20/2014 0900   RBC 4.13 07/08/2014 1221   HGB 7.7* 07/21/2014 0557   HGB 12.8 07/08/2014 1221   HCT 24.6* 07/21/2014 0557   HCT 38.6 07/08/2014 1221   PLT 174 07/21/2014 0557   PLT 226 07/08/2014 1221   MCV 91.4 07/21/2014 0557   MCV 94 07/08/2014 1221   MCH 28.6 07/21/2014 0557   MCH 31.0 07/08/2014 1221   MCHC 31.3 07/21/2014 0557   MCHC 33.2 07/08/2014 1221   RDW 16.8* 07/21/2014 0557   RDW 12.3 07/08/2014 1221   LYMPHSABS 1.7 07/19/2014 0710   LYMPHSABS 1.5 07/08/2014 1221   MONOABS 0.6 07/19/2014 0710   EOSABS 0.1 07/19/2014 0710   EOSABS 0.1 07/08/2014 1221   BASOSABS 0.0 07/19/2014 0710   BASOSABS 0.0 07/08/2014 1221    BMET    Component Value Date/Time   NA 143 07/21/2014 0557   K 3.7 07/21/2014 0557   CL 105 07/21/2014 0557   CO2 26 07/21/2014 0557   GLUCOSE 106* 07/21/2014 0557   BUN 11 07/21/2014 0557   CREATININE 0.93 07/21/2014 0557  CREATININE 0.65 05/27/2013 1706   CALCIUM 8.5 07/21/2014 0557   GFRNONAA 62* 07/21/2014 0557   GFRAA 72* 07/21/2014 0557    CMP     Component Value Date/Time   NA 143 07/21/2014 0557   K 3.7 07/21/2014 0557   CL 105 07/21/2014 0557   CO2 26 07/21/2014 0557   GLUCOSE 106* 07/21/2014 0557   BUN 11 07/21/2014 0557   CREATININE 0.93 07/21/2014 0557   CREATININE 0.65 05/27/2013 1706   CALCIUM 8.5 07/21/2014 0557   PROT 5.0* 07/17/2014 1804   ALBUMIN 2.7* 07/17/2014 1804   AST 18 07/17/2014 1804   ALT 13 07/17/2014 1804   ALKPHOS 50 07/17/2014 1804   BILITOT <0.2* 07/17/2014 1804   GFRNONAA 62* 07/21/2014 0557   GFRAA 72* 07/21/2014 0557     Assessment and Plan: 1. Code Status: FULL 2. Symptom Control: 1. Anxiety/Agitation: Xanax TID prn.  2. Insomnia: Trazodone qhs prn.  3. Pain: Nitroglycerin prn chest pain. Percocet 5-325 mg every 4 hours prn.  4. Fever: Acetaminophen prn.   5. Nausea/Vomiting: Ondansetron prn.  3. Psycho/Social: Emotional support provided to Linda [name].  4. Spiritual: Support from personal pastor and church friends. 5. Disposition: To be determined on outcomes.     Time In Time Out Total Time Spent with Patient Total Overall Time  1520 1555 53min 37min    Greater than 50%  of this time was spent counseling and coordinating care related to the above assessment and plan.  Vinie Sill, NP Palliative Medicine Team Pager # [phone] (M-F 8a-5p) Team Phone # [phone] (Nights/Weekends)

## 2014-07-21 NOTE — Progress Notes (Signed)
Mrs. Weppler is feeling a little better. She still really not doing much. I really think physical therapy will help her. Again, I wonder if inpatient physical therapy might be worthwhile to think about.  There is no [. Her hemoglobin was 8.0 yesterday. I will go ahead and give her iron. Her iron saturation is only 11%. Her ferritin is quite high. A lot of this is an acute phase reactant.  She has not had any obvious melena or prior blood per rectum.  I do not see any indication for an IVC filter. Her last studies did not show any thromboembolic disease.  At some point she will be on aspirin.  Of note, her erythropoietin level is very low. I think a dose of Aranesp will help. She has had this in the office.  I am going to also put her on folic acid. This often times can help patients who have had GI bleeding.  Her vital signs are pretty good. Her blood pressure is 104/46. Heart rate is 71. She is afebrile. Her lungs are clear. There may be some occasional wheezing. Cardiac exam is regular in rhythm. Abdomen is soft. There is no fluid wave. There is no palpable liver or spleen tip. Extremities shows no clubbing cyanosis or edema. Skin exam shows some scattered ecchymoses.  Hopefully, she will be a will to get stronger. I think physical therapy will help with this.  We will try to work on her anemia. I think that if her hemoglobin is worse today, we may have to transfuse her one unit as the iron and other interventions will take probably 2 or 3 weeks before they work.  As always, we had an excellent prayer session.  Harriette Ohara 17:14

## 2014-07-21 NOTE — Progress Notes (Signed)
OT Cancellation Note  Patient Details Name: Linda [name] MRN: [id number] DOB: 07/13/1946   Cancelled Treatment:    Reason Eval/Treat Not Completed: Patient declined, no reason specified . Pt declined PT session this afternoon due to not feeling well. OT attempted evaluation, however pt still not feeling well and requests therapy to be held until she receives additional blood transfusion. Reinforced PT's discussion of functional mobility to increase strength and endurance to promote independence. OT will continue to follow up as available to complete evaluation.   Juluis Rainier [phone] 07/21/2014, 4:16 PM

## 2014-07-22 ENCOUNTER — Other Ambulatory Visit: Payer: Medicare Other | Admitting: Lab

## 2014-07-22 ENCOUNTER — Ambulatory Visit: Payer: Medicare Other | Admitting: Family Medicine

## 2014-07-22 DIAGNOSIS — D649 Anemia, unspecified: Secondary | ICD-10-CM

## 2014-07-22 DIAGNOSIS — J9600 Acute respiratory failure, unspecified whether with hypoxia or hypercapnia: Secondary | ICD-10-CM

## 2014-07-22 DIAGNOSIS — A419 Sepsis, unspecified organism: Secondary | ICD-10-CM

## 2014-07-22 LAB — BASIC METABOLIC PANEL
ANION GAP: 11 (ref 5–15)
BUN: 12 mg/dL (ref 6–23)
CHLORIDE: 107 meq/L (ref 96–112)
CO2: 24 meq/L (ref 19–32)
CREATININE: 0.73 mg/dL (ref 0.50–1.10)
Calcium: 9.1 mg/dL (ref 8.4–10.5)
GFR calc Af Amer: >90 mL/min (ref >90)
GFR calc non Af Amer: 86 mL/min — ABNORMAL LOW (ref >90)
GLUCOSE: 132 mg/dL — AB (ref 70–99)
Potassium: 4 mEq/L (ref 3.7–5.3)
Sodium: 142 mEq/L (ref 137–147)

## 2014-07-22 LAB — CBC
HEMATOCRIT: 29.1 % — AB (ref 36.0–46.0)
HEMOGLOBIN: 9.8 g/dL — AB (ref 12.0–15.0)
MCH: 30 pg (ref 26.0–34.0)
MCHC: 33.7 g/dL (ref 30.0–36.0)
MCV: 89 fL (ref 78.0–100.0)
Platelets: 198 10*3/uL (ref 150–400)
RBC: 3.27 MIL/uL — ABNORMAL LOW (ref 3.87–5.11)
RDW: 15.8 % — ABNORMAL HIGH (ref 11.5–15.5)
WBC: 10.2 10*3/uL (ref 4.0–10.5)

## 2014-07-22 LAB — TYPE AND SCREEN
ABO/RH(D): A POS
ANTIBODY SCREEN: NEGATIVE
Unit division: 0

## 2014-07-22 MED ORDER — POLYETHYLENE GLYCOL 3350 17 G PO PACK
17.0000 g | PACK | Freq: Every day | ORAL | Status: DC
  Administered 2014-07-22 – 2014-07-23 (×2): 17 g via ORAL
  Filled 2014-07-22 (×2): qty 1

## 2014-07-22 NOTE — Progress Notes (Signed)
Read, reviewed, edited and agree with student's findings and recommendations.  Joram Venson B. Joachim Carton, PT, DPT #[phone]  

## 2014-07-22 NOTE — Progress Notes (Signed)
Progress Note  Linda [name]:[id number] DOB: 11/26/1946 DOA: 07/17/2014 PCP: Tammi Sou, MD  Brief narrative: 68 yo WF PMHx pulmonary embolism on chronic anticoagulation, hypertension, nonobstructive CAD and fibromyalgia. Also has asthma and asbestosis as well as sleep apnea, on chronic O2 on home (2-3 L O2 via [place]). She presented to the emergency department after experiencing a loss of consciousness. She had 3 episodes over the previous 24 hours. Also recent diagnosis of urinary tract infection and was taking Cipro. She also noticed epistaxis over several days and noticed increased bruising as well. Because the patient was so dizzy upon standing the family notified EMS.  In the ER the patient was noted to be orthostatic. Her stool was positive for occult blood. Her hemoglobin was noted to be 5 which was confirmed as a true result. In review of the electronic medical record patient's hemoglobin was 12.8 on 07/08/2014.  She was on Coumadin, Plavix and meloxicam, CT head with nonspecific changes in petrous bone may require FU MRI.  HPI/Subjective: Still weak- no further melena, hasnt gotten out of bed yet -last Bm several days back  Assessment/Plan:   Upper GI bleeding due to gastritis -No further melena since admission -EGD with gastritis and antral mass; path reactive gastropathy no malignancy/dysplasia -no further NSAIDs (was on Mobic preadmit) -Coumadin stopped    Anemia associated with acute blood loss -hb 5 on admission, baseline around 10 -Has had chronic anemia for many years and was actually worked up by hematologist in [place]  -s/p 3 units PRBC since admission -has chronic Anemia followed by Dr. Marin Olp -hb improved     Antral mass -path with reactive inflammation only   Supratherapeutic INR -INR greater than 10 at presentation, was on Cipro for UTI Has received vitamin K x2 as well as 2 units FFP -Followup PT 15.8 with an INR of 1.2   CAD -has  non obstructive -per dr.Harwani, resume ASA if possible, ok to stop Plavix   History of pulmonary embolism -Anticoagulation on hold -PE in 8/14 -Dr.Ennever following -no indication for IVC filter     HTN (hypertension), benign -improving -Continue to hold antihypertensive agent    Chronic resp failure/asbestosis/asthma -on 3L home O2 -Followed by pulmonologist as an outpatient    Chronic diastolic CHF, NYHA class 1 -Compensated    Syncope -Seems directly related to symptomatic anemia    Nonspecific changes in the petrous bone -radiologist notes this is probably due to volume averaging, and is stable since 2013 but nonetheless suggests MRI for f/u -further workup as outpt    Fibromyalgia syndrome/Polypharmacy/Grief reaction -Patient normally takes Percocet at home per her report-ordered here -per Ennever's office notes pt has been struggling with depression since her husband died earlier this year and endorsing fatigue and increased reports of pain -chaplain support appreciated  Depression -Secondary to the recent death of husband, as well as her multiple medical problems. -continue Paxil 20 mg daily    OSA  -continue nocturnal CPAP  DVT prophylaxis: SCDs  Code Status: Full  Family Communication: none at bedside  Disposition Plan/Expected LOS: home tomorrow   Consultants: Dr. Charolette Forward (cardiology)  Dr. Lucio Edward Gastroenterology PA Sherrilyn Rist (oncology)    Procedures: EGD  Cultures: None  Antibiotics: None  Objective: Blood pressure 155/68, pulse 77, temperature 97.5 F (36.4 C), temperature source Oral, resp. rate 17, height 5\' 1"  (1.549 m), weight 81.8 kg (180 lb 5.4 oz), SpO2 100.00%.  Intake/Output Summary (Last 24 hours)  at 07/22/14 1042 Last data filed at 07/22/14 0600  Gross per 24 hour  Intake    827 ml  Output   2050 ml  Net  -1223 ml     Exam: General: AAOx3, flat affect, No acute respiratory distress-very pale appearing    Lungs: Clear to auscultation bilaterally without wheezes or crackles, 2 L  Cardiovascular: Regular rate and rhythm without murmur gallop or rub normal S1 and S2, no peripheral edema or JVD Abdomen: Nontender, nondistended, soft, bowel sounds positive, no rebound, no ascites, no appreciable mass Musculoskeletal: No significant cyanosis, clubbing of bilateral lower extremities   Scheduled Meds:  Scheduled Meds: . amitriptyline  100 mg Oral QHS  . atorvastatin  20 mg Oral q1800  . darbepoetin (ARANESP) injection - NON-DIALYSIS  300 mcg Subcutaneous Once  . folic acid  2 mg Oral Daily  . isosorbide mononitrate  30 mg Oral Daily  . lidocaine  1 patch Transdermal Q24H  . nebivolol  5 mg Oral Daily  . pantoprazole  40 mg Oral Q0600  . PARoxetine  20 mg Oral Daily  . polyethylene glycol  17 g Oral Daily  . pregabalin  200 mg Oral Daily  . pregabalin  400 mg Oral QHS  . senna-docusate  1 tablet Oral BID   Data Reviewed: Basic Metabolic Panel:  Recent Labs Lab 07/18/14 1215 07/19/14 0315 07/20/14 0444 07/21/14 0557 07/22/14 0047  NA 143 142 142 143 142  K 3.5* 3.3* 3.8 3.7 4.0  CL 103 105 104 105 107  CO2 28 28 24 26 24   GLUCOSE 100* 92 93 106* 132*  BUN 13 11 12 11 12   CREATININE 0.80 0.76 1.00 0.93 0.73  CALCIUM 8.3* 8.5 8.6 8.5 9.1  MG  --   --  1.8  --   --    Liver Function Tests:  Recent Labs Lab 07/17/14 1804  AST 18  ALT 13  ALKPHOS 50  BILITOT <0.2*  PROT 5.0*  ALBUMIN 2.7*   CBC:  Recent Labs Lab 07/17/14 1804  07/18/14 1215 07/19/14 0710 07/20/14 0444 07/21/14 0557 07/22/14 0047  WBC 6.4  < > 6.6 5.7 13.1* 6.7 10.2  NEUTROABS 3.6  --   --  3.2  --   --   --   HGB 5.1*  < > 8.2* 8.5* 8.0* 7.7* 9.8*  HCT 15.5*  < > 25.2* 25.8* 24.8* 24.6* 29.1*  MCV 94.5  < > 87.5 87.8 89.5 91.4 89.0  PLT 128*  < > 154 150 167 174 198  < > = values in this interval not displayed. BNP (last 3 results)  Recent Labs  09/13/13 1520 04/23/14 1800 04/26/14 0708   PROBNP 262.4* 801.7* 2423.0*   CBG:  Recent Labs Lab 07/18/14 0623 07/18/14 1201 07/18/14 2340 07/19/14 0609  GLUCAP 105* 105* 110* 80    Recent Results (from the past 240 hour(s))  URINE CULTURE     Status: None   Collection Time    07/15/14 12:47 PM      Result Value Ref Range Status   Culture ESCHERICHIA COLI   Final   Colony Count >=100,000 COLONIES/ML   Final   Organism ID, Bacteria ESCHERICHIA COLI   Final    If 7PM-7AM, please contact night-coverage [email or web address] Password [email or web address] 07/22/2014, 10:42 AM   LOS: 5 days   Domenic Polite, MD [phone]

## 2014-07-22 NOTE — Progress Notes (Signed)
I stopped by to speak with Ms. Spiker and she had a visitor at bedside. She is sitting up in chair and color is improved from yesterday and she appears to be more energetic in conversation. Reminded her about the information/resources I left for her about grief counseling/support groups. She has no questions or concerns at this time I reminded her that I am not here this weekend but will be back Monday and will visit her if she remains inpatient.   Vinie Sill, NP Palliative Medicine Team Pager # [phone] (M-F 8a-5p) Team Phone # [phone] (Nights/Weekends)

## 2014-07-22 NOTE — Progress Notes (Signed)
Linda [name] looks a lot better. She did get transfused with one unit of blood yesterday. Her hemoglobin was down to 7.7. The patient did get IV iron. She also got vitamin B12. She also was on folic acid. She did receive one dose of Aranesp.  Physical therapy is seeing her. They recommended [facility] inpatient [facility]. I think this would be a great idea for her. I really think she could benefit from inpatient rehabilitation so she came home and feel safe in the stronger.  She doesn't appear to be hurting as much. I think once her anemia improves, some of her pain will also improve.  She has a little bit of a better appetite. She is not having nausea vomiting.  There is no fever.  She is on a lot of medications. It would be nice if some of these could be reviewed to make it easier for her when she does go home.  On her physical, her blood pressure is 162/84. Her pulse is 79. She is afebrile. Her lungs are clear embolic. Cardiac exam regular in rhythm with an occasional extra beat. She is a 1/6 systolic ejection murmur. Abdomen soft. Good bowel sounds. There is no fluid wave. There is no palpable liver or spleen tip. Extremities shows no clubbing cyanosis or edema.  Again, I think that [facility] inpatient rehabilitation would really benefit her. I think that once her hemoglobin is above 10 or 11, that she will start improving have more strength and more balance and more stamina.  I think we will have to be very careful with her Aranesp. The problem is the pro thrombotic risk of Aranesp. Unfortunately, she has such a low erythropoietin level that it might be hard for her to have any hematopoiesis without Aranesp administration.  I feel confident that he iron will start working and that in 2 weeks we should be seeing her hemoglobin improve.[name] Kitchen

## 2014-07-22 NOTE — Progress Notes (Signed)
Occupational Therapy Evaluation Patient Details Name: LAIKLYNN RACZYNSKI MRN: [id number] DOB: 03/16/1946 Today's Date: 07/22/2014    History of Present Illness 68 y.o. F admitted 07/17/14 with acute Gi bleed. PMHx of pulmonary embolism on chronic anticoagulation, HTN, nonobstructive CAD, fibromyalgia, asthma/asbestosis/chronic respiratory failure, COPD, on chronic 02 (subjectively 3 L at home via [place]), sleep apnea, chronic diastolic CHF, anemia, DDD (cervical and lumbar), osteoarthritis, BPPV and knee surgery.   Clinical Impression   PTA pt lived at home alone and was independent with ADLs and functional mobility. Pt reports that she would fatigue at times, however it has been progressively worse and she is concerned about falling. Pt is moving well for short distances around room, however quickly becomes fatigued. Educated pt on fall prevention and energy conservation techniques. Discussed extensively with pt the purpose of rehab to increase independence and pt had questions about CIR vs. SNF vs. Home health. OT relayed to pt that she strongly recommended post-acute rehab to increase independence since pt does not have 24/7 assistance. Pt would continue to benefit from skilled OT to increase independence with ADLs.     Follow Up Recommendations  CIR;Supervision/Assistance - 24 hour    Equipment Recommendations  Other (comment) (Defer to next venue)       Precautions / Restrictions Precautions Precautions: Fall Restrictions Weight Bearing Restrictions: No      Mobility Bed Mobility Overal bed mobility: Needs Assistance Bed Mobility: Sit to Supine     Supine to sit: Supervision     General bed mobility comments: Pt required no (A) for bed mobility.   Transfers Overall transfer level: Needs assistance Equipment used: Rolling walker (2 wheeled) Transfers: Sit to/from [facility] Sit to Stand: Min guard Stand pivot transfers: Min guard       General transfer  comment: Min guard for safety. No VC's needed for hand placement.         ADL Overall ADL's : Needs assistance/impaired Eating/Feeding: Independent;Sitting   Grooming: Min guard;Standing   Upper Body Bathing: Set up;Sitting   Lower Body Bathing: Min guard;Sit to/from stand   Upper Body Dressing : Set up;Sitting   Lower Body Dressing: Min guard;Sit to/from stand   Toilet Transfer: Min guard;Stand-pivot;BSC;RW   Toileting- Water quality scientist and Hygiene: Min guard;Sit to/from stand         General ADL Comments: Pt moves slowly and cautiously however is asymptomatic at present. Educated and discussed with pt extensively on CIR vs. SNF. vs Home with home health. Educated pt on fall prevention and increasing safety with ADLs at home through energy conservation principles.      Vision  Pt reports no change from baseline. No apparent visual deficits.                    Perception Perception Perception Tested?: No   Praxis Praxis Praxis tested?: Within functional limits    Pertinent Vitals/Pain NAD     Hand Dominance Right   Extremity/Trunk Assessment Upper Extremity Assessment Upper Extremity Assessment: Generalized weakness   Lower Extremity Assessment Lower Extremity Assessment: Generalized weakness   Cervical / Trunk Assessment Cervical / Trunk Assessment: Normal   Communication Communication Communication: No difficulties   Cognition Arousal/Alertness: Awake/alert Behavior During Therapy: WFL for tasks assessed/performed Overall Cognitive Status: Within Functional Limits for tasks assessed                                Home  Living Family/patient expects to be discharged to:: Private residence Living Arrangements: Alone Available Help at Discharge: Family;Friend(s);Available PRN/intermittently Type of Home: House Home Access: Stairs to enter [employer] of Steps: 3-4, has 8 at other entrance but doesn't use Entrance  Stairs-Rails: Can reach both Home Layout: One level     Bathroom Shower/Tub: Tub/shower unit Shower/tub characteristics: Architectural technologist: Standard Bathroom Accessibility: Yes How Accessible: Accessible via walker Home Equipment: [place] - single point;Walker - 2 wheels;Bedside commode;Grab bars - toilet;Grab bars - tub/shower;Hand held shower head          Prior Functioning/Environment Level of Independence: Independent             OT Diagnosis: Generalized weakness   OT Problem List: Decreased strength;Decreased range of motion;Decreased activity tolerance;Impaired balance (sitting and/or standing);Decreased safety awareness;Decreased knowledge of use of DME or AE;Decreased knowledge of precautions   OT Treatment/Interventions: Self-care/ADL training;Therapeutic exercise;Energy conservation;DME and/or AE instruction;Therapeutic activities;Patient/family education;Balance training    OT Goals(Current goals can be found in the care plan section) Acute Rehab OT Goals Patient Stated Goal: to get stronger OT Goal Formulation: With patient Time For Goal Achievement: 07/29/14 Potential to Achieve Goals: Good ADL Goals Pt Will Perform Grooming: with modified independence;standing Pt Will Perform Lower Body Bathing: with modified independence;sit to/from stand Pt Will Perform Lower Body Dressing: with modified independence;sit to/from stand Pt Will Transfer to Toilet: with modified independence;ambulating;regular height toilet Pt Will Perform Toileting - Clothing Manipulation and hygiene: with modified independence;sit to/from stand Pt Will Perform Tub/Shower Transfer: Tub transfer;with modified independence;ambulating;3 in 1;rolling walker Additional ADL Goal #1: Pt will ambulate distance of house (~118ft) in order to complete IADLs without becoming fatigued.   OT Frequency: Min 2X/week   Barriers to D/C: Decreased caregiver support             End of Session Equipment  Utilized During Treatment: Gait belt;Rolling walker;Oxygen Nurse Communication: Mobility status  Activity Tolerance: Patient tolerated treatment well Patient left: in bed;with call bell/phone within reach;with bed alarm set   Time: 1324-4010 OT Time Calculation (min): 54 min Charges:  OT General Charges $OT Visit: 1 Procedure OT Evaluation $Initial OT Evaluation Tier I: 1 Procedure OT Treatments $Self Care/Home Management : 23-37 mins $Therapeutic Activity: 8-22 mins  Juluis Rainier [phone] 07/22/2014, 4:16 PM

## 2014-07-22 NOTE — Progress Notes (Addendum)
Physical Therapy Evaluation Patient Details Name: Linda [name] MRN: [id number] DOB: Nov 08, 1946 Today's Date: 07/22/2014   History of Present Illness  68 y.o. F admitted 07/17/14 with acute Gi bleed. PMHx of pulmonary embolism on chronic anticoagulation, HTN, nonobstructive CAD, fibromyalgia, asthma/asbestosis/chronic respiratory failure, COPD, on chronic 02 (subjectively 3 L at home via [place]), sleep apnea, chronic diastolic CHF, anemia, DDD (cervical and lumbar), osteoarthritis, BPPV and knee surgery.  Clinical Impression  Pt mobilized with RW and min guard/+2 for safety equipment for chair to follow as pt began staggering in hall after 35' due to lightheadedness. Pt subjectively reported heart was fluttering as her other vitals after amb were BP 148/64 and HR 83, O2 sats 98% on 3 L O2 [place] and nurse was alerted to contact MD. PT to recommend CIR in order to increase pt's endurance, strength, and independence as pt lives alone.     Follow Up Recommendations CIR    Equipment Recommendations  None recommended by PT    Recommendations for Other Services OT consult     Precautions / Restrictions Precautions Precautions: Fall Restrictions Weight Bearing Restrictions: No      Mobility  Bed Mobility Overal bed mobility: Needs Assistance Bed Mobility: Supine to Sit     Supine to sit: [facility] elevated;Min assist     General bed mobility comments: Min assist with torso and LE. Cued to scoot EOB so feet touch floor.  Transfers Overall transfer level: Needs assistance Equipment used: Rolling walker (2 wheeled) Transfers: Sit to/from [facility] Sit to Stand: +2 safety/equipment;Min guard Stand pivot transfers: Min guard;+2 safety/equipment       General transfer comment: Min guard for pt safety. Pt cued to push from side of the bed rather than walker. +2 safety/equiptment for O2 and IV pole.  Ambulation/Gait Ambulation/Gait assistance: +2 safety/equipment;Min  guard Ambulation Distance (Feet): 35 Feet Assistive device: Rolling walker (2 wheeled) Gait Pattern/deviations: Step-through pattern;Staggering left;Staggering right Gait velocity: Decreased Gait velocity interpretation: Below normal speed for age/gender General Gait Details: +2 safety/equiptment for chair to follow and O2 and IV pole. Walked 1' before becoming lightheaded and needing to sit down. Pt begins staggering and needs chair to follow for safety when lightheaded and needs to sit down immediately. Attempted to rest and walk again as pt motivated but pt not responding to questions and rolled pt back to room in chair. Pt subjectively states heart feels like it's fluttering. BP after amb 148/64 and HR 83. Nurse notified and will communicate with MD.       Balance Overall balance assessment: Needs assistance Sitting-balance support: No upper extremity supported;Feet supported Sitting balance-Leahy Scale: Good Sitting balance - Comments: Pt able to scoot EOB without support with VCs.   Standing balance support: Bilateral upper extremity supported;During functional activity Standing balance-Leahy Scale: Poor                               Pertinent Vitals/Pain BP 148/64 and HR 83 after amb.    Home Living Family/patient expects to be discharged to:: Private residence Living Arrangements: Alone Available Help at Discharge: Family;Friend(s);Available PRN/intermittently Type of Home: House Home Access: Stairs to enter Entrance Stairs-Rails: Can reach both Entrance Stairs-Number of Steps: 3-4, has 8 at other entrance but doesn't use Home Layout: One level Home Equipment: Cane - single point;Walker - 2 wheels Agricultural consultant)      Prior Function Level of Independence: Independent  Comments:  (Slower but can still get around, use when needed)     Hand Dominance   Dominant Hand: Right    Extremity/Trunk Assessment   Upper Extremity Assessment: Defer to OT  evaluation           Lower Extremity Assessment: Generalized weakness (Not specifically tested, 3/5 supine against gravity)         Communication   Communication: No difficulties  Cognition Arousal/Alertness: Awake/alert Behavior During Therapy: WFL for tasks assessed/performed Overall Cognitive Status: Within Functional Limits for tasks assessed                               Assessment/Plan    PT Assessment Patient needs continued PT services  PT Diagnosis Difficulty walking;Generalized weakness;Acute pain   PT Problem List Decreased strength;Decreased range of motion;Decreased activity tolerance;Decreased balance;Decreased mobility;Decreased coordination;Decreased cognition;Decreased knowledge of use of DME;Decreased knowledge of precautions;Cardiopulmonary status limiting activity;Pain  PT Treatment Interventions DME instruction;Gait training;Stair training;Functional mobility training;Therapeutic activities;Therapeutic exercise;Balance training;Modalities   PT Goals (Current goals can be found in the Care Plan section) Acute Rehab PT Goals PT Goal Formulation: With patient Time For Goal Achievement: 08/05/14 Potential to Achieve Goals: Good    Frequency Min 3X/week   Barriers to discharge Decreased caregiver support Caregivers cannot provide 24/7 supervision       End of Session Equipment Utilized During Treatment: Gait belt;Oxygen Activity Tolerance: Patient limited by fatigue (Lightheaded) Patient left: in chair;with call bell/phone within reach;with chair alarm set Nurse Communication: Mobility status (Lightheadedness and vitals)         Time: 1100-1151 PT Time Calculation (min): 51 min   Charges:   PT Evaluation $Initial PT Evaluation Tier I: 1 Procedure PT Treatments $Gait Training: 8-22 mins $Therapeutic Activity: 8-22 mins   PT G CodesEber Jones, SPT [phone]

## 2014-07-23 DIAGNOSIS — J9610 Chronic respiratory failure, unspecified whether with hypoxia or hypercapnia: Secondary | ICD-10-CM | POA: Diagnosis present

## 2014-07-23 DIAGNOSIS — I2699 Other pulmonary embolism without acute cor pulmonale: Secondary | ICD-10-CM

## 2014-07-23 LAB — COMPREHENSIVE METABOLIC PANEL
ALT: 10 U/L (ref 0–35)
ANION GAP: 9 (ref 5–15)
AST: 13 U/L (ref 0–37)
Albumin: 3 g/dL — ABNORMAL LOW (ref 3.5–5.2)
Alkaline Phosphatase: 58 U/L (ref 39–117)
BUN: 11 mg/dL (ref 6–23)
CO2: 27 meq/L (ref 19–32)
CREATININE: 0.71 mg/dL (ref 0.50–1.10)
Calcium: 8.7 mg/dL (ref 8.4–10.5)
Chloride: 104 mEq/L (ref 96–112)
GFR calc non Af Amer: 87 mL/min — ABNORMAL LOW (ref >90)
Glucose, Bld: 103 mg/dL — ABNORMAL HIGH (ref 70–99)
Potassium: 3.3 mEq/L — ABNORMAL LOW (ref 3.7–5.3)
Sodium: 140 mEq/L (ref 137–147)
Total Bilirubin: 0.3 mg/dL (ref 0.3–1.2)
Total Protein: 5.7 g/dL — ABNORMAL LOW (ref 6.0–8.3)

## 2014-07-23 LAB — CBC
HCT: 29.1 % — ABNORMAL LOW (ref 36.0–46.0)
Hemoglobin: 9.6 g/dL — ABNORMAL LOW (ref 12.0–15.0)
MCH: 29.8 pg (ref 26.0–34.0)
MCHC: 33 g/dL (ref 30.0–36.0)
MCV: 90.4 fL (ref 78.0–100.0)
Platelets: 189 10*3/uL (ref 150–400)
RBC: 3.22 MIL/uL — AB (ref 3.87–5.11)
RDW: 16.1 % — ABNORMAL HIGH (ref 11.5–15.5)
WBC: 7 10*3/uL (ref 4.0–10.5)

## 2014-07-23 LAB — RETICULOCYTES
RBC.: 3.22 MIL/uL — ABNORMAL LOW (ref 3.87–5.11)
RETIC CT PCT: 5.3 % — AB (ref 0.4–3.1)
Retic Count, Absolute: 170.7 10*3/uL (ref 19.0–186.0)

## 2014-07-23 MED ORDER — POLYETHYLENE GLYCOL 3350 17 G PO PACK
17.0000 g | PACK | Freq: Every day | ORAL | Status: DC
  Administered 2014-07-24 – 2014-07-25 (×2): 17 g via ORAL
  Filled 2014-07-23 (×3): qty 1

## 2014-07-23 MED ORDER — SENNOSIDES-DOCUSATE SODIUM 8.6-50 MG PO TABS
2.0000 | ORAL_TABLET | Freq: Two times a day (BID) | ORAL | Status: DC
  Administered 2014-07-24 – 2014-07-26 (×5): 2 via ORAL
  Filled 2014-07-23 (×6): qty 2

## 2014-07-23 MED ORDER — POTASSIUM CHLORIDE CRYS ER 20 MEQ PO TBCR
40.0000 meq | EXTENDED_RELEASE_TABLET | Freq: Once | ORAL | Status: AC
  Administered 2014-07-23: 40 meq via ORAL
  Filled 2014-07-23: qty 2

## 2014-07-23 NOTE — Progress Notes (Signed)
Progress Note  Linda [name] IFO:[id number] DOB: 12/13/1946 DOA: 07/17/2014 PCP: Tammi Sou, MD  Brief narrative: 68 yo WF PMHx pulmonary embolism on chronic anticoagulation, hypertension, nonobstructive CAD and fibromyalgia. Also has asthma and asbestosis as well as sleep apnea, on chronic O2 on home (2-3 L O2 via [place]). She presented to the emergency department after experiencing a loss of consciousness. She had 3 episodes over the previous 24 hours. Also recent diagnosis of urinary tract infection and was taking Cipro. She also noticed epistaxis over several days and noticed increased bruising as well. Because the patient was so dizzy upon standing the family notified EMS.  In the ER the patient was noted to be orthostatic. Her stool was positive for occult blood. Her hemoglobin was noted to be 5 which was confirmed as a true result. In review of the electronic medical record patient's hemoglobin was 12.8 on 07/08/2014.  She was on Coumadin, Plavix and meloxicam, CT head with nonspecific changes in petrous bone may require FU MRI.  HPI/Subjective: No bm yet, feels ok otherwise  Assessment/Plan:   Upper GI bleeding due to gastritis -resolved -EGD with gastritis and antral mass; path reactive gastropathy no malignancy/dysplasia -no further NSAIDs (was on Mobic preadmit) -Coumadin stopped    Anemia associated with acute blood loss -hb 5 on admission, baseline around 10 -Has had chronic anemia for many years and was actually worked up by hematologist in [place]  -s/p 3 units PRBC since admission -hb stable     Antral mass -path with reactive inflammation only   Supratherapeutic INR -INR greater than 10 at presentation, s/p FFPx2   CAD -has non obstructive -per dr.Harwani, resume ASA if possible, ok to stop Plavix   History of pulmonary embolism -Anticoagulation on hold -PE in 8/14 -Dr.Ennever following -no indication for IVC filter     HTN (hypertension),  benign -improving -Continue to hold antihypertensive agent    Chronic resp failure/asbestosis/asthma -on 3L home O2 -Followed by pulmonologist as an outpatient    Chronic diastolic CHF, NYHA class 1 -Compensated    Syncope -Seems directly related to symptomatic anemia    Nonspecific changes in the petrous bone -radiologist notes this is probably due to volume averaging, and is stable since 2013 but nonetheless suggests MRI for f/u -further workup as outpt    Fibromyalgia syndrome/Polypharmacy/Grief reaction -Patient normally takes Percocet at home per her report-ordered here -per Ennever's office notes pt has been struggling with depression since her husband died earlier this year and endorsing fatigue and increased reports of pain -chaplain support appreciated  Depression -Secondary to the recent death of husband, as well as her multiple medical problems. -continue Paxil 20 mg daily    OSA  -continue nocturnal CPAP  DVT prophylaxis: SCDs  Code Status: Full  Family Communication: none at bedside  Disposition Plan/Expected LOS: CIR eval pending   Consultants: Dr. Charolette Forward (cardiology)  Dr. Lucio [facility] Sherrilyn Rist (oncology)    Procedures: EGD  Cultures: None  Antibiotics: None  Objective: Blood pressure 138/74, pulse 67, temperature 98.1 F (36.7 C), temperature source Oral, resp. rate 15, height 5\' 1"  (1.549 m), weight 81.9 kg (180 lb 8.9 oz), SpO2 96.00%.  Intake/Output Summary (Last 24 hours) at 07/23/14 1127 Last data filed at 07/23/14 1117  Gross per 24 hour  Intake    904 ml  Output   2625 ml  Net  -1721 ml     Exam: General: AAOx3, flat affect, No  acute respiratory distress-very pale appearing  Lungs: Clear to auscultation bilaterally without wheezes or crackles, 2 L  Cardiovascular: Regular rate and rhythm without murmur gallop or rub normal S1 and S2, no peripheral edema or JVD Abdomen: Nontender, nondistended,  soft, bowel sounds positive, no rebound, no ascites, no appreciable mass Musculoskeletal: No significant cyanosis, clubbing of bilateral lower extremities   Scheduled Meds:  Scheduled Meds: . amitriptyline  100 mg Oral QHS  . atorvastatin  20 mg Oral q1800  . folic acid  2 mg Oral Daily  . isosorbide mononitrate  30 mg Oral Daily  . lidocaine  1 patch Transdermal Q24H  . nebivolol  5 mg Oral Daily  . pantoprazole  40 mg Oral Q0600  . PARoxetine  20 mg Oral Daily  . polyethylene glycol  17 g Oral Daily  . pregabalin  200 mg Oral Daily  . pregabalin  400 mg Oral QHS  . senna-docusate  2 tablet Oral BID   Data Reviewed: Basic Metabolic Panel:  Recent Labs Lab 07/19/14 0315 07/20/14 0444 07/21/14 0557 07/22/14 0047 07/23/14 0346  NA 142 142 143 142 140  K 3.3* 3.8 3.7 4.0 3.3*  CL 105 104 105 107 104  CO2 28 24 26 24 27   GLUCOSE 92 93 106* 132* 103*  BUN 11 12 11 12 11   CREATININE 0.76 1.00 0.93 0.73 0.71  CALCIUM 8.5 8.6 8.5 9.1 8.7  MG  --  1.8  --   --   --    Liver Function Tests:  Recent Labs Lab 07/17/14 1804 07/23/14 0346  AST 18 13  ALT 13 10  ALKPHOS 50 58  BILITOT <0.2* 0.3  PROT 5.0* 5.7*  ALBUMIN 2.7* 3.0*   CBC:  Recent Labs Lab 07/17/14 1804  07/19/14 0710 07/20/14 0444 07/21/14 0557 07/22/14 0047 07/23/14 0346  WBC 6.4  < > 5.7 13.1* 6.7 10.2 7.0  NEUTROABS 3.6  --  3.2  --   --   --   --   HGB 5.1*  < > 8.5* 8.0* 7.7* 9.8* 9.6*  HCT 15.5*  < > 25.8* 24.8* 24.6* 29.1* 29.1*  MCV 94.5  < > 87.8 89.5 91.4 89.0 90.4  PLT 128*  < > 150 167 174 198 189  < > = values in this interval not displayed. BNP (last 3 results)  Recent Labs  09/13/13 1520 04/23/14 1800 04/26/14 0708  PROBNP 262.4* 801.7* 2423.0*   CBG:  Recent Labs Lab 07/18/14 0623 07/18/14 1201 07/18/14 2340 07/19/14 0609  GLUCAP 105* 105* 110* 80    Recent Results (from the past 240 hour(s))  URINE CULTURE     Status: None   Collection Time    07/15/14 12:47  PM      Result Value Ref Range Status   Culture ESCHERICHIA COLI   Final   Colony Count >=100,000 COLONIES/ML   Final   Organism ID, Bacteria ESCHERICHIA COLI   Final    If 7PM-7AM, please contact night-coverage [email or web address] Password [email or web address] 07/23/2014, 11:27 AM   LOS: 6 days   Domenic Polite, MD [phone]

## 2014-07-24 DIAGNOSIS — J9610 Chronic respiratory failure, unspecified whether with hypoxia or hypercapnia: Secondary | ICD-10-CM

## 2014-07-24 LAB — CBC
HCT: 33.2 % — ABNORMAL LOW (ref 36.0–46.0)
Hemoglobin: 10.7 g/dL — ABNORMAL LOW (ref 12.0–15.0)
MCH: 29.8 pg (ref 26.0–34.0)
MCHC: 32.2 g/dL (ref 30.0–36.0)
MCV: 92.5 fL (ref 78.0–100.0)
Platelets: 203 10*3/uL (ref 150–400)
RBC: 3.59 MIL/uL — ABNORMAL LOW (ref 3.87–5.11)
RDW: 16 % — AB (ref 11.5–15.5)
WBC: 6.7 10*3/uL (ref 4.0–10.5)

## 2014-07-24 LAB — COMPREHENSIVE METABOLIC PANEL
ALBUMIN: 2.9 g/dL — AB (ref 3.5–5.2)
ALT: 10 U/L (ref 0–35)
ANION GAP: 15 (ref 5–15)
AST: 19 U/L (ref 0–37)
Alkaline Phosphatase: 60 U/L (ref 39–117)
BUN: 12 mg/dL (ref 6–23)
CO2: 24 mEq/L (ref 19–32)
CREATININE: 0.75 mg/dL (ref 0.50–1.10)
Calcium: 8.9 mg/dL (ref 8.4–10.5)
Chloride: 102 mEq/L (ref 96–112)
GFR calc non Af Amer: 85 mL/min — ABNORMAL LOW (ref >90)
Glucose, Bld: 90 mg/dL (ref 70–99)
POTASSIUM: 4.1 meq/L (ref 3.7–5.3)
Sodium: 141 mEq/L (ref 137–147)
TOTAL PROTEIN: 6 g/dL (ref 6.0–8.3)
Total Bilirubin: 0.2 mg/dL — ABNORMAL LOW (ref 0.3–1.2)

## 2014-07-24 LAB — RETICULOCYTES
RBC.: 3.59 MIL/uL — AB (ref 3.87–5.11)
RETIC CT PCT: 4.4 % — AB (ref 0.4–3.1)
Retic Count, Absolute: 158 10*3/uL (ref 19.0–186.0)

## 2014-07-24 NOTE — Progress Notes (Signed)
Progress Note  Linda [name]:[id number] DOB: [birth year] DOA: 07/17/2014 PCP: Tammi Sou, MD  Brief narrative: 68 yo WF PMHx pulmonary embolism on chronic anticoagulation, hypertension, nonobstructive CAD and fibromyalgia. Also has asthma and asbestosis as well as sleep apnea, on chronic O2 on home (2-3 L O2 via [place]). She presented to the emergency department after experiencing a loss of consciousness. She had 3 episodes over the previous 24 hours. Also recent diagnosis of urinary tract infection and was taking Cipro. She also noticed epistaxis over several days and noticed increased bruising as well. Because the patient was so dizzy upon standing the family notified EMS.  In the ER the patient was noted to be orthostatic. Her stool was positive for occult blood. Her hemoglobin was noted to be 5 which was confirmed as a true result. In review of the electronic medical record patient's hemoglobin was 12.8 on 07/08/2014.  She was on Coumadin, Plavix and meloxicam, CT head with nonspecific changes in petrous bone may require FU MRI.  HPI/Subjective: Black stool yesterday, no Bm since  Assessment/Plan:   Upper GI bleeding due to gastritis -resolved -EGD with gastritis and antral mass; path reactive gastropathy no malignancy/dysplasia -no further NSAIDs (was on Mobic preadmit) -Coumadin stopped -dark stool yesterday was likely retained stool from last pm    Anemia associated with acute blood loss -hb 5 on admission, baseline around 10 -Has had chronic anemia for many years and was actually worked up by hematologist in [place]  -s/p 3 units PRBC since admission -hb stable, improving     Antral mass -path with reactive inflammation only   Supratherapeutic INR -INR greater than 10 at presentation, s/p FFPx2   CAD -has non obstructive -per dr.Harwani, resume ASA if possible, ok to stop Plavix   History of pulmonary embolism -Anticoagulation on hold -PE in  8/14 -Dr.Ennever following -no indication for IVC filter     HTN (hypertension), benign -improving -Continue to hold antihypertensive agent    Chronic resp failure/asbestosis/asthma -on 3L home O2 -Followed by pulmonologist as an outpatient    Chronic diastolic CHF, NYHA class 1 -Compensated    Syncope -Seems directly related to symptomatic anemia    Nonspecific changes in the petrous bone -radiologist notes this is probably due to volume averaging, and is stable since 2013 but nonetheless suggests MRI for f/u -further workup as outpt    Fibromyalgia syndrome/Polypharmacy/Grief reaction -Patient normally takes Percocet at home per her report-ordered here -per Ennever's office notes pt has been struggling with depression since her husband died earlier this year and endorsing fatigue and increased reports of pain -chaplain support appreciated  Depression -Secondary to the recent death of husband, as well as her multiple medical problems. -continue Paxil 20 mg daily    OSA  -continue nocturnal CPAP  Deconditioning-CIR recommended per Pt/OT, pt also wants this, await Rehab MD eval monday  DVT prophylaxis: SCDs  Code Status: Full  Family Communication: none at bedside  Disposition Plan/Expected LOS: CIR eval pending   Consultants: Dr. Charolette Forward (cardiology)  Dr. Lucio [facility] Sherrilyn Rist (oncology)    Procedures: EGD  Cultures: None  Antibiotics: None  Objective: Blood pressure 151/75, pulse 66, temperature 98.2 F (36.8 C), temperature source Oral, resp. rate 16, height 5\' 1"  (1.549 m), weight 80.875 kg (178 lb 4.8 oz), SpO2 98.00%.  Intake/Output Summary (Last 24 hours) at 07/24/14 1434 Last data filed at 07/24/14 0806  Gross per 24 hour  Intake  440 ml  Output   1150 ml  Net   -710 ml     Exam: General: AAOx3, flat affect, No acute respiratory distress-very pale appearing  Lungs: Clear to auscultation bilaterally  without wheezes or crackles, 2 L  Cardiovascular: Regular rate and rhythm without murmur gallop or rub normal S1 and S2, no peripheral edema or JVD Abdomen: Nontender, nondistended, soft, bowel sounds positive, no rebound, no ascites, no appreciable mass Musculoskeletal: No significant cyanosis, clubbing of bilateral lower extremities   Scheduled Meds:  Scheduled Meds: . amitriptyline  100 mg Oral QHS  . atorvastatin  20 mg Oral q1800  . folic acid  2 mg Oral Daily  . isosorbide mononitrate  30 mg Oral Daily  . lidocaine  1 patch Transdermal Q24H  . nebivolol  5 mg Oral Daily  . pantoprazole  40 mg Oral Q0600  . PARoxetine  20 mg Oral Daily  . polyethylene glycol  17 g Oral Daily  . pregabalin  200 mg Oral Daily  . pregabalin  400 mg Oral QHS  . senna-docusate  2 tablet Oral BID   Data Reviewed: Basic Metabolic Panel:  Recent Labs Lab 07/20/14 0444 07/21/14 0557 07/22/14 0047 07/23/14 0346 07/24/14 0300  NA 142 143 142 140 141  K 3.8 3.7 4.0 3.3* 4.1  CL 104 105 107 104 102  CO2 24 26 24 27 24   GLUCOSE 93 106* 132* 103* 90  BUN 12 11 12 11 12   CREATININE 1.00 0.93 0.73 0.71 0.75  CALCIUM 8.6 8.5 9.1 8.7 8.9  MG 1.8  --   --   --   --    Liver Function Tests:  Recent Labs Lab 07/17/14 1804 07/23/14 0346 07/24/14 0300  AST 18 13 19   ALT 13 10 10   ALKPHOS 50 58 60  BILITOT <0.2* 0.3 0.2*  PROT 5.0* 5.7* 6.0  ALBUMIN 2.7* 3.0* 2.9*   CBC:  Recent Labs Lab 07/17/14 1804  07/19/14 0710 07/20/14 0444 07/21/14 0557 07/22/14 0047 07/23/14 0346 07/24/14 0300  WBC 6.4  < > 5.7 13.1* 6.7 10.2 7.0 6.7  NEUTROABS 3.6  --  3.2  --   --   --   --   --   HGB 5.1*  < > 8.5* 8.0* 7.7* 9.8* 9.6* 10.7*  HCT 15.5*  < > 25.8* 24.8* 24.6* 29.1* 29.1* 33.2*  MCV 94.5  < > 87.8 89.5 91.4 89.0 90.4 92.5  PLT 128*  < > 150 167 174 198 189 203  < > = values in this interval not displayed. BNP (last 3 results)  Recent Labs  09/13/13 1520 04/23/14 1800 04/26/14 0708   PROBNP 262.4* 801.7* 2423.0*   CBG:  Recent Labs Lab 07/18/14 0623 07/18/14 1201 07/18/14 2340 07/19/14 0609  GLUCAP 105* 105* 110* 80    Recent Results (from the past 240 hour(s))  URINE CULTURE     Status: None   Collection Time    07/15/14 12:47 PM      Result Value Ref Range Status   Culture ESCHERICHIA COLI   Final   Colony Count >=100,000 COLONIES/ML   Final   Organism ID, Bacteria ESCHERICHIA COLI   Final    If 7PM-7AM, please contact night-coverage [email or web address] Password [email or web address] 07/24/2014, 2:34 PM   LOS: 7 days   Domenic Polite, MD [phone]

## 2014-07-25 DIAGNOSIS — R269 Unspecified abnormalities of gait and mobility: Secondary | ICD-10-CM

## 2014-07-25 DIAGNOSIS — R5381 Other malaise: Secondary | ICD-10-CM

## 2014-07-25 LAB — CBC
HEMATOCRIT: 32.3 % — AB (ref 36.0–46.0)
Hemoglobin: 10.3 g/dL — ABNORMAL LOW (ref 12.0–15.0)
MCH: 28.9 pg (ref 26.0–34.0)
MCHC: 31.9 g/dL (ref 30.0–36.0)
MCV: 90.7 fL (ref 78.0–100.0)
PLATELETS: 223 10*3/uL (ref 150–400)
RBC: 3.56 MIL/uL — ABNORMAL LOW (ref 3.87–5.11)
RDW: 15.6 % — AB (ref 11.5–15.5)
WBC: 6.1 10*3/uL (ref 4.0–10.5)

## 2014-07-25 LAB — COMPREHENSIVE METABOLIC PANEL
ALBUMIN: 3.2 g/dL — AB (ref 3.5–5.2)
ALK PHOS: 61 U/L (ref 39–117)
ALT: 9 U/L (ref 0–35)
AST: 13 U/L (ref 0–37)
Anion gap: 11 (ref 5–15)
BUN: 12 mg/dL (ref 6–23)
CHLORIDE: 104 meq/L (ref 96–112)
CO2: 30 mEq/L (ref 19–32)
Calcium: 9 mg/dL (ref 8.4–10.5)
Creatinine, Ser: 0.78 mg/dL (ref 0.50–1.10)
GFR calc Af Amer: >90 mL/min (ref >90)
GFR calc non Af Amer: 84 mL/min — ABNORMAL LOW (ref >90)
Glucose, Bld: 87 mg/dL (ref 70–99)
POTASSIUM: 3.7 meq/L (ref 3.7–5.3)
SODIUM: 145 meq/L (ref 137–147)
TOTAL PROTEIN: 6.3 g/dL (ref 6.0–8.3)
Total Bilirubin: 0.3 mg/dL (ref 0.3–1.2)

## 2014-07-25 LAB — RETICULOCYTES
RBC.: 3.56 MIL/uL — ABNORMAL LOW (ref 3.87–5.11)
Retic Count, Absolute: 153.1 10*3/uL (ref 19.0–186.0)
Retic Ct Pct: 4.3 % — ABNORMAL HIGH (ref 0.4–3.1)

## 2014-07-25 MED ORDER — SODIUM CHLORIDE 0.9 % IV SOLN
INTRAVENOUS | Status: DC
  Administered 2014-07-25: 13:00:00 via INTRAVENOUS

## 2014-07-25 MED ORDER — SODIUM CHLORIDE 0.9 % IV SOLN: INTRAVENOUS | Status: AC

## 2014-07-25 MED ORDER — BISACODYL 10 MG RE SUPP
10.0000 mg | Freq: Once | RECTAL | Status: AC
  Administered 2014-07-25: 10 mg via RECTAL
  Filled 2014-07-25: qty 1

## 2014-07-25 NOTE — Consult Note (Signed)
Physical Medicine and Rehabilitation Consult  Reason for Consult: Deconditioning  Referring Physician: Dr. Broadus John  HPI: Linda [name] is a 68 y.o. female with history of HTN, asbestosis--oxygen depenedent, chronic pain, anemia, recent UTI who was admitted on 07/17/14 with orthostatic episodes for past 2 weeks with onset of nose bleed X 2 days with multiple falls. She on chronic coumadin, plavix and meloxicam and was found to be anemic with Hgb 5.1 and INR >10.0. CT head with right petrous bone mass and CT abdomen/pelvis without retroperitoneal hematoma. She was transfused with 2 units PRBC and treated with FFP and Vit K. Patient placed on IV PPI and underwent EGD revealing gastritis as well as biopsy of gastric nodule on 07/28. Hem/Onc consulted for input on need for anticoagulation and felt that coumadin not needed as CT chest earlier this year as well as BLE dopplers negative. Dr. Terrence Dupont consulted for input and recommended low dose ASA once cleared per GI. Palliative care consulted for support due to grief/loss of husband as well as anxiety/agitation/pain issues and provided ego support as well as Resources on support groups. PT ongoing and patient limited by deconditioning as well as orthostatic symptoms. MD, PT recommending CIR for progressive therapies.  Review of Systems  Constitutional: Positive for malaise/fatigue.  HENT: Positive for tinnitus. Negative for hearing loss.  Eyes: Negative for blurred vision and double vision.  Respiratory: Positive for shortness of breath.  Cardiovascular: Negative for chest pain and palpitations.  Gastrointestinal: Negative for heartburn and nausea.  Musculoskeletal: Positive for back pain and myalgias (RLE pain).  Radiculopathy RLE.  Neurological: Positive for sensory change (right 4/5th finger and hand due to nerve injury.). Negative for headaches.  Psychiatric/Behavioral: Positive for depression. The patient is nervous/anxious.   Past Medical  History   Diagnosis  Date   .  HTN (hypertension)    .  Depression    .  Recurrent UTI      +hx of hospitalization for pyelonephritis   .  Hay fever    .  Mixed incontinence urge and stress    .  Diverticular disease    .  Insomnia    .  Fibromyalgia      Patient states dx was around her late 82s but she had sx's for years prior to this.   .  Syncope      +Hypotensive; ED visit--Dr. Terrence Dupont did Cath--nonobstructive CAD, EF 55-60%   .  Idiopathic angio-edema-urticaria  72014     Angioedema component was very minimal   .  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  Asthma      + asbestososis   .  History of pneumonia      hospitalized 12/2011, 02/2013, and 07/2013 [facility]) for this   .  High cholesterol      "I'm on Lipitor cause it will help my heart in some way" (07/12/2013)   .  Anginal pain      Nonobstructive CAD 2014   .  OSA on CPAP    .  Iron deficiency anemia      Hematologist in [place], [place] did extensive w/u; no cause found; failed oral supplement;; gets fairly regular (q14m or so) IV iron infusions (Venofer   200mg  with procrit. "for 14 yr I've been getting blood work q month & getting infusions prn" (07/12/2013). Dr. Marin Olp locally, iron infusions done, then  erythropoietin deficiency diagnosed: aranesp to be started as of 09/2013.   .  H/O hiatal hernia    .  Migraine syndrome      "not as often anymore; used to be ~ q wk" (07/12/2013)   .  Tension headache, chronic    .  DDD (degenerative disc disease)      lumbar and cervical.   .  Osteoarthritis      "severe; progressing fast" (07/12/2013)   .  Chronic lower back pain    .  Anxiety      panic attacks   .  Nephrolithiasis      "passed all on my own or they are still in there" (07/12/2013)   .  Pyelonephritis      "several times over the last 30 yr" (07/12/2013)   .  Diastolic congestive heart failure    .  COPD (chronic  obstructive pulmonary disease)    .  Pulmonary embolism  07/2013     coumadin needed x 6 mo; Dx at [facility] with very small peripheral upper lobe pe 07/2013   .  Pleural plaque with presence of asbestos  07/22/2013   .  BPPV (benign paroxysmal positional vertigo)  12/16/2012   .  RBBB (right bundle branch block)     Past Surgical History   Procedure  Laterality  Date   .  Appendectomy   1960   .  Total abdominal hysterectomy   1974   .  Tendon release   1996     Right forearm and hand   .  Knee surgery   2005   .  Heel spur surgery  Left  2008   .  Plantar fascia release  Left  2008   .  Axillary surgery  Left  1978     Multiple "lump" in armpit per pt   .  Coccyx removal   1972   .  Cardiac catheterization   01/2013     nonobstructive CAD, EF 55-60%   .  Transthoracic echocardiogram   01/2013; 04/2014     2014--NORMAL. 2015--focal basal septal hypertrophy, EF 55-60%, grade I diast dysfxn, mild LAE.   .  Dilation and curettage of uterus   ? 1970's   .  Eye surgery  Left  2012-2013     "injections for ~ 1 yr; don't really know what for" (07/12/2013)   .  Spirometry   04/25/14     In hosp for acute asthma/COPD flare: mixed obstructive and restrictive lung disease. The FEV1 is severely reduced at 45% predicted. FEV1 signif decreased compared to prior spirometry 07/23/13.   [name] Kitchen  Esophagogastroduodenoscopy  N/A  07/19/2014     Procedure: ESOPHAGOGASTRODUODENOSCOPY ; Surgeon: Ladene Artist, MD; Location: [facility] ENDOSCOPY; Service: Endoscopy; Laterality: N/A;    Family History   Problem  Relation  Age of Onset   .  Arthritis  Mother    .  Kidney disease  Mother    .  Heart disease  Father    .  Stroke  Father    .  Hypertension  Father    .  Diabetes  Father     Social History: Lives alone. Retired Pharmacist, hospital. Independent without AD.... Furniture walks at home. She reports that she has never smoked. She has never used smokeless tobacco. She reports that she does not drink alcohol or use illicit drugs.    Allergies   Allergen  Reactions   .  Penicillins  Itching, Swelling  and Rash     Tolerated Cefepime in ED.    Medications Prior to Admission   Medication  Sig  Dispense  Refill   .  albuterol (PROVENTIL HFA;VENTOLIN HFA) 108 (90 BASE) MCG/ACT inhaler  Inhale 2 puffs into the lungs every 6 (six) hours as needed for wheezing or shortness of breath.     [name] Kitchen  albuterol (PROVENTIL) (2.5 MG/3ML) 0.083% nebulizer solution  Take 3 mLs (2.5 mg total) by nebulization every 6 (six) hours as needed for wheezing or shortness of breath.  75 mL  1   .  ALPRAZolam (XANAX) 1 MG tablet  Take 0.5-1 mg by mouth 3 (three) times daily as needed for anxiety or sleep.     [name] Kitchen  amitriptyline (ELAVIL) 100 MG tablet  Take 100 mg by mouth at bedtime.     [name] Kitchen  atorvastatin (LIPITOR) 20 MG tablet  Take 1 tablet (20 mg total) by mouth daily at 6 PM.  30 tablet  6   .  ciprofloxacin (CIPRO) 500 MG tablet  Take 1 tablet (500 mg total) by mouth 2 (two) times daily.  10 tablet  0   .  clopidogrel (PLAVIX) 75 MG tablet  Take 75 mg by mouth daily.     .  furosemide (LASIX) 20 MG tablet  Take 20 mg by mouth daily.     .  isosorbide mononitrate (IMDUR) 30 MG 24 hr tablet  Take 30 mg by mouth daily.     .  meloxicam (MOBIC) 7.5 MG tablet  Take 7.5 mg by mouth 2 (two) times daily.     .  Milnacipran (SAVELLA) 50 MG TABS tablet  Take 50 mg by mouth 2 (two) times daily.     .  Multiple Vitamins-Minerals (CENTRUM SILVER) tablet  Take 1 tablet by mouth daily.     .  nebivolol (BYSTOLIC) 5 MG tablet  Take 5 mg by mouth daily.     .  nitroGLYCERIN (NITROSTAT) 0.4 MG SL tablet  Place 1 tablet (0.4 mg total) under the tongue every 5 (five) minutes x 3 doses as needed for chest pain.  60 tablet  3   .  phenazopyridine (PYRIDIUM) 200 MG tablet  Take 1 tablet (200 mg total) by mouth 3 (three) times daily as needed for pain.  6 tablet  0   .  pregabalin (LYRICA) 200 MG capsule  Take 200-400 mg by mouth 3 (three) times daily. Take 1 capsule in the  morning and supper and 2 capsule at bedtime     .  traZODone (DESYREL) 50 MG tablet  Take 100-200 mg by mouth at bedtime as needed (for insomnia).     .  warfarin (COUMADIN) 5 MG tablet  Take 10 mg by mouth daily.      Home:  Home Living  Family/patient expects to be discharged to:: Private residence  Living Arrangements: Alone  Available Help at Discharge: Family;Friend(s);Available PRN/intermittently  Type of Home: House  Home Access: Stairs to enter  [employer] of Steps: 3-4, has 8 at other entrance but doesn't use  Entrance Stairs-Rails: Can reach both  Home Layout: One level  Home Equipment: Cane - single point;Walker - 2 wheels;Bedside commode;Grab bars - toilet;Grab bars - tub/shower;Hand held shower head  Functional History:  Prior Function  Level of Independence: Independent  Comments: (Slower but can still get around, use when needed)  Functional Status:  Mobility:  Bed Mobility  Overal bed mobility: Needs Assistance  Bed Mobility: Sit  to Supine  Supine to sit: Supervision  General bed mobility comments: Pt required no (A) for bed mobility.  Transfers  Overall transfer level: Needs assistance  Equipment used: Rolling walker (2 wheeled)  Transfers: Sit to/from [facility]  Sit to Stand: Min guard  Stand pivot transfers: Min guard  General transfer comment: Min guard for safety. No VC's needed for hand placement.  Ambulation/Gait  Ambulation/Gait assistance: +2 safety/equipment;Min guard  Ambulation Distance (Feet): 35 Feet  Assistive device: Rolling walker (2 wheeled)  Gait Pattern/deviations: Step-through pattern;Staggering left;Staggering right  Gait velocity: Decreased  Gait velocity interpretation: Below normal speed for age/gender  General Gait Details: +2 safety/equiptment for chair to follow and O2 and IV pole. Walked 107' before becoming lightheaded and needing to sit down. Pt begins staggering and needs chair to follow for safety  when lightheaded and needs to sit down immediately. Attempted to rest and walk again as pt motivated but pt not responding to questions and rolled pt back to room in chair. Pt subjectively states heart feels like it's fluttering. BP after amb 148/64 and HR 83. Nurse notified and will communicate with MD.   ADL:  ADL  Overall ADL's : Needs assistance/impaired  Eating/Feeding: Independent;Sitting  Grooming: Min guard;Standing  Upper Body Bathing: Set up;Sitting  Lower Body Bathing: Min guard;Sit to/from stand  Upper Body Dressing : Set up;Sitting  Lower Body Dressing: Min guard;Sit to/from stand  Toilet Transfer: Min guard;Stand-pivot;BSC;RW  Toileting- Water quality scientist and Hygiene: Min guard;Sit to/from stand  General ADL Comments: Pt moves slowly and cautiously however is asymptomatic at present. Educated and discussed with pt extensively on CIR vs. SNF. vs Home with home health. Educated pt on fall prevention and increasing safety with ADLs at home through energy conservation principles.  Cognition:  Cognition  Overall Cognitive Status: Within Functional Limits for tasks assessed  Orientation Level: Oriented X4  Cognition  Arousal/Alertness: Awake/alert  Behavior During Therapy: WFL for tasks assessed/performed  Overall Cognitive Status: Within Functional Limits for tasks assessed  Blood pressure 177/87, pulse 75, temperature 97.6 F (36.4 C), temperature source Oral, resp. rate 17, height 5\' 1"  (1.549 m), weight 82.2 kg (181 lb 3.5 oz), SpO2 96.00%.   Physical Exam  Nursing note and vitals reviewed.  Constitutional: She is oriented to person, place, and time. She appears well-developed and well-nourished.  Pale and anxious appearing.  HENT:  Head: Normocephalic and atraumatic.  Eyes: Conjunctivae are normal. Pupils are equal, round, and reactive to light.  Neck: Normal range of motion. Neck supple.  Cardiovascular: Normal rate and regular rhythm.  Respiratory: Effort normal  and breath sounds normal. No respiratory distress. She has no wheezes.  GI: Soft. Bowel sounds are normal. She exhibits no distension. There is no tenderness.  Neurological: She is alert and oriented to person, place, and time. Mild sensory loss over right foot and ulnar hand. Strength grossly 4+/5 in all 4 limbs. Normal rom and fmc. Cognitively displays good insight and awareness.  Skin: Skin is warm and dry.  Psychiatric: Her speech is normal and behavior is normal. Judgment normal. Her mood appears anxious. She is not slowed and not withdrawn. Cognition and memory are normal.  Mentioned loss of husband couple of time during conversation.   Results for orders placed during the hospital encounter of 07/17/14 (from the past 24 hour(s))   CBC Status: Abnormal    Collection Time    07/25/14 5:05 AM   Result  Value  Ref Range  WBC  6.1  4.0 - 10.5 K/uL    RBC  3.56 (*)  3.87 - 5.11 MIL/uL    Hemoglobin  10.3 (*)  12.0 - 15.0 g/dL    HCT  32.3 (*)  36.0 - 46.0 %    MCV  90.7  78.0 - 100.0 fL    MCH  28.9  26.0 - 34.0 pg    MCHC  31.9  30.0 - 36.0 g/dL    RDW  15.6 (*)  11.5 - 15.5 %    Platelets  223  150 - 400 K/uL   COMPREHENSIVE METABOLIC PANEL Status: Abnormal    Collection Time    07/25/14 5:05 AM   Result  Value  Ref Range    Sodium  145  137 - 147 mEq/L    Potassium  3.7  3.7 - 5.3 mEq/L    Chloride  104  96 - 112 mEq/L    CO2  30  19 - 32 mEq/L    Glucose, Bld  87  70 - 99 mg/dL    BUN  12  6 - 23 mg/dL    Creatinine, Ser  0.78  0.50 - 1.10 mg/dL    Calcium  9.0  8.4 - 10.5 mg/dL    Total Protein  6.3  6.0 - 8.3 g/dL    Albumin  3.2 (*)  3.5 - 5.2 g/dL    AST  13  0 - 37 U/L    ALT  9  0 - 35 U/L    Alkaline Phosphatase  61  39 - 117 U/L    Total Bilirubin  0.3  0.3 - 1.2 mg/dL    GFR calc non Af Amer  84 (*)  >90 mL/min    GFR calc Af Amer  >90  >90 mL/min    Anion gap  11  5 - 15   RETICULOCYTES Status: Abnormal    Collection Time    07/25/14 5:05 AM   Result   Value  Ref Range    Retic Ct Pct  4.3 (*)  0.4 - 3.1 %    RBC.  3.56 (*)  3.87 - 5.11 MIL/uL    Retic Count, Manual  153.1  19.0 - 186.0 K/uL    No results found.  Assessment/Plan:  Diagnosis: gait disorder related to multiple medical and psychosocial issues  1. Does the need for close, 24 hr/day medical supervision in concert with the patient's rehab needs make it unreasonable for this patient to be served in a less intensive setting? No and Potentially 2. Co-Morbidities requiring supervision/potential complications: htn, fibromyalgia 3. Due to bladder management, bowel management, safety, skin/wound care, disease management, medication administration, pain management and patient education, does the patient require 24 hr/day rehab nursing? No and Potentially 4. Does the patient require coordinated care of a physician, rehab nurse, PT, OT to address physical and functional deficits in the context of the above medical diagnosis(es)? No and Potentially Addressing deficits in the following areas: balance, endurance, locomotion, strength, transferring, bowel/bladder control, bathing, dressing, feeding, grooming and toileting 5. Can the patient actively participate in an intensive therapy program of at least 3 hrs of therapy per day at least 5 days per week? Yes 6. The potential for patient to make measurable gains while on inpatient rehab is fair 7. Anticipated functional outcomes upon discharge from inpatient rehab are modified independent with PT, modified independent with OT, n/a with SLP. 8. Estimated rehab length of stay to reach the  above functional goals is: to be determined 9. Does the patient have adequate social supports to accommodate these discharge functional goals? Potentially 10. Anticipated D/C setting: Home 11. Anticipated post D/C treatments: [facility] 12. Overall Rehab/Functional Prognosis: good   RECOMMENDATIONS:  This patient's condition is appropriate for continued  rehabilitative care in the following setting: SNF and [facility]  Patient has agreed to participate in recommended program. Potentially  Note that insurance prior authorization may be required for reimbursement for recommended care.  Comment: There isn't much medically to justify an inpatient rehab stay. Also, she is at a contact guard assistance level with therapy. Would like to see her follow up therapy assessments from later this morning, but at this point she is probably too high level all around to justify inpatient rehab.  Meredith Staggers, MD, [facility]  07/25/2014

## 2014-07-25 NOTE — Progress Notes (Signed)
Occupational Therapy Treatment Patient Details Name: Linda [name] MRN: [id number] DOB: 1946/01/13 Today's Date: 07/25/2014    History of present illness 68 y.o. F admitted 07/17/14 with acute Gi bleed. PMHx of pulmonary embolism on chronic anticoagulation, HTN, nonobstructive CAD, fibromyalgia, asthma/asbestosis/chronic respiratory failure, COPD, on chronic 02 (subjectively 3 L at home via [place]), sleep apnea, chronic diastolic CHF, anemia, DDD (cervical and lumbar), osteoarthritis, BPPV and knee surgery.   OT comments  This 68 yo female not making progress this session due to BP issues--RN made aware. Will continue to work with patient while she is here to work towards Independent to [employer] I level.  Follow Up Recommendations  CIR;Supervision/Assistance - 24 hour          Precautions / Restrictions Precautions Precautions: Fall Restrictions Weight Bearing Restrictions: No       Mobility Bed Mobility Overal bed mobility: Needs Assistance Bed Mobility: Sit to Supine       Sit to supine: Supervision      Transfers Overall transfer level: Needs assistance Equipment used: Rolling walker (2 wheeled) Transfers: Sit to/from Stand Sit to Stand: Min guard                  ADL Overall ADL's : Needs assistance/impaired                         Toilet Transfer: Min guard (stand turn from recliner to bed)             General ADL Comments: Pt having issues with BP today, see vitals section                Cognition     Overall Cognitive Status: Within Functional Limits for tasks assessed                                    Pertinent Vitals/ Pain       No c/o pain         Frequency Min 2X/week     Progress Toward Goals  OT Goals(current goals can now be found in the care plan section)  Progress towards OT goals: Not progressing toward goals - comment (due to BP issues)     Plan Discharge plan remains appropriate       End of  Session Equipment Utilized During Treatment: Rolling walker   Activity Tolerance  (limited by orhostatic BP)   Patient Left in bed;with call bell/phone within reach   Nurse Communication  (BP readings)        Time: 1191-4782 OT Time Calculation (min): 21 min  Charges: OT General Charges $OT Visit: 1 Procedure OT Treatments $Self Care/Home Management : 8-22 mins  Almon Register [phone] 07/25/2014, 5:21 PM

## 2014-07-25 NOTE — Progress Notes (Signed)
Received prescreen request for inpatient rehab and noted that rehab MD consult order has already been placed. We will await completion of rehab MD consult and an admission coordinator will follow up. Thank you.  Nanetta Batty, PT Rehabilitation Admissions Coordinator [phone]

## 2014-07-25 NOTE — Progress Notes (Signed)
Notified by MD that we are awaiting acute PT notes today to determine pt's eligibility for CIR and that pt is willing to go to SNF if she has to.  CSW, Doris, notified of the potential SNF need.  She is at [phone].  Sandi Mariscal, RN BSN MHA CCM  Case Manager, Trauma Service/Unit 27[facility] [phone]

## 2014-07-25 NOTE — Progress Notes (Addendum)
Progress Note  Linda [name]:[id number] DOB: [birth year] DOA: 07/17/2014 PCP: Tammi Sou, MD  Brief narrative: 68 yo WF PMHx pulmonary embolism on chronic anticoagulation, hypertension, nonobstructive CAD and fibromyalgia. Also has asthma and asbestosis as well as sleep apnea, on chronic O2 on home (2-3 L O2 via [place]). She presented to the emergency department after experiencing a loss of consciousness. She had 3 episodes over the previous 24 hours. Also recent diagnosis of urinary tract infection and was taking Cipro. She also noticed epistaxis over several days and noticed increased bruising as well. Because the patient was so dizzy upon standing the family notified EMS.  In the ER the patient was noted to be orthostatic. Her stool was positive for occult blood. Her hemoglobin was noted to be 5 which was confirmed as a true result. In review of the electronic medical record patient's hemoglobin was 12.8 on 07/08/2014.  She was on Coumadin, Plavix and meloxicam, CT head with nonspecific changes in petrous bone may require FU MRI.  HPI/Subjective: Black stool 8/1, no Bm since Feels ok, little dizzy yesterday with ambulation  Assessment/Plan:   Upper GI bleeding due to gastritis -resolved -EGD with gastritis and antral mass; path reactive gastropathy no malignancy/dysplasia -no further NSAIDs (was on Mobic preadmit) -Coumadin stopped -dark stool 8/1 was likely retained stool from last pm   Anemia associated with acute blood loss -hb 5 on admission, baseline around 10 -Has had chronic anemia for many years and was actually worked up by hematologist in [place]  -s/p 3 units PRBC since admission -hb stable, improving   Antral mass -path with reactive inflammation only   Supratherapeutic INR -INR greater than 10 at presentation, s/p FFPx2   CAD -has non obstructive -per dr.Harwani, resume ASA if possible, ok to stop Plavix   History of pulmonary  embolism -Anticoagulation/warfarin stopped -PE in 8/14 -Dr.Ennever following -no indication for IVC filter    HTN (hypertension), benign -improving -orthostatic this am, stop imdur -gentle IVF for 12hours   Chronic resp failure/asbestosis/asthma -on 3L home O2 -Followed by pulmonologist as an outpatient   Chronic diastolic CHF, NYHA class 1 -Compensated   Syncope -Seems directly related to symptomatic anemia   Nonspecific changes in the petrous bone -radiologist notes this is probably due to volume averaging, and is stable since 2013 but nonetheless suggests MRI for f/u -further workup as outpt   Fibromyalgia syndrome/Polypharmacy/Grief reaction -Patient normally takes Percocet at home per her report-ordered here -per Ennever's office notes pt has been struggling with depression since her husband died earlier this year and endorsing fatigue and increased reports of pain -chaplain support appreciated  Depression -Secondary to the recent death of husband, as well as her multiple medical problems. -continue Paxil 20 mg daily  OSA  -continue nocturnal CPAP  Deconditioning-CIR recommended per Pt/OT, pt also wants this, await PT and rehab MD eval  DVT prophylaxis: SCDs  Code Status: Full  Family Communication: none at bedside  Disposition Plan/Expected LOS: CIR vs SNF  Consultants: Dr. Charolette Forward (cardiology)  Dr. Lucio [facility] Sherrilyn Rist (oncology)   Procedures: EGD  Cultures: None  Antibiotics: None  Objective: Blood pressure 115/67, pulse 74, temperature 97.6 F (36.4 C), temperature source Oral, resp. rate 17, height 5\' 1"  (1.549 m), weight 82.2 kg (181 lb 3.5 oz), SpO2 97.00%.  Intake/Output Summary (Last 24 hours) at 07/25/14 1411 Last data filed at 07/25/14 1100  Gross per 24 hour  Intake  0 ml  Output   2650 ml  Net  -2650 ml     Exam: General: AAOx3, flat affect, No acute respiratory distress-very pale appearing   Lungs: Clear to auscultation bilaterally without wheezes or crackles, 2 L  Cardiovascular: Regular rate and rhythm without murmur gallop or rub normal S1 and S2, no peripheral edema or JVD Abdomen: Nontender, nondistended, soft, bowel sounds positive, no rebound, no ascites, no appreciable mass Musculoskeletal: No significant cyanosis, clubbing of bilateral lower extremities   Scheduled Meds:  Scheduled Meds: . amitriptyline  100 mg Oral QHS  . atorvastatin  20 mg Oral q1800  . folic acid  2 mg Oral Daily  . lidocaine  1 patch Transdermal Q24H  . nebivolol  5 mg Oral Daily  . pantoprazole  40 mg Oral Q0600  . PARoxetine  20 mg Oral Daily  . polyethylene glycol  17 g Oral Daily  . pregabalin  200 mg Oral Daily  . pregabalin  400 mg Oral QHS  . senna-docusate  2 tablet Oral BID   Data Reviewed: Basic Metabolic Panel:  Recent Labs Lab 07/20/14 0444 07/21/14 0557 07/22/14 0047 07/23/14 0346 07/24/14 0300 07/25/14 0505  NA 142 143 142 140 141 145  K 3.8 3.7 4.0 3.3* 4.1 3.7  CL 104 105 107 104 102 104  CO2 24 26 24 27 24 30   GLUCOSE 93 106* 132* 103* 90 87  BUN 12 11 12 11 12 12   CREATININE 1.00 0.93 0.73 0.71 0.75 0.78  CALCIUM 8.6 8.5 9.1 8.7 8.9 9.0  MG 1.8  --   --   --   --   --    Liver Function Tests:  Recent Labs Lab 07/23/14 0346 07/24/14 0300 07/25/14 0505  AST 13 19 13   ALT 10 10 9   ALKPHOS 58 60 61  BILITOT 0.3 0.2* 0.3  PROT 5.7* 6.0 6.3  ALBUMIN 3.0* 2.9* 3.2*   CBC:  Recent Labs Lab 07/19/14 0710  07/21/14 0557 07/22/14 0047 07/23/14 0346 07/24/14 0300 07/25/14 0505  WBC 5.7  < > 6.7 10.2 7.0 6.7 6.1  NEUTROABS 3.2  --   --   --   --   --   --   HGB 8.5*  < > 7.7* 9.8* 9.6* 10.7* 10.3*  HCT 25.8*  < > 24.6* 29.1* 29.1* 33.2* 32.3*  MCV 87.8  < > 91.4 89.0 90.4 92.5 90.7  PLT 150  < > 174 198 189 203 223  < > = values in this interval not displayed. BNP (last 3 results)  Recent Labs  09/13/13 1520 04/23/14 1800 04/26/14 0708   PROBNP 262.4* 801.7* 2423.0*   CBG:  Recent Labs Lab 07/18/14 2340 07/19/14 0609  GLUCAP 110* 80    No results found for this or any previous visit (from the past 240 hour(s)).  If 7PM-7AM, please contact night-coverage [email or web address] Password [email or web address] 07/25/2014, 2:11 PM   LOS: 8 days   Domenic Polite, MD [phone]

## 2014-07-25 NOTE — Progress Notes (Addendum)
Physical Therapy Treatment Patient Details Name: Linda [name] MRN: [id number] DOB: 1946-10-17 Today's Date: 07/25/2014    History of Present Illness 68 y.o. F admitted 07/17/14 with acute Gi bleed. PMHx of pulmonary embolism on chronic anticoagulation, HTN, nonobstructive CAD, fibromyalgia, asthma/asbestosis/chronic respiratory failure, COPD, on chronic 02 (subjectively 3 L at home via [place]), sleep apnea, chronic diastolic CHF, anemia, DDD (cervical and lumbar), osteoarthritis, BPPV and knee surgery.    PT Comments    Therapy/Progressive ambulation limited by postural hypotension.  Pt deconditioned and could benefit from rehab.  Follow Up Recommendations  CIR     Equipment Recommendations  None recommended by PT    Recommendations for Other Services       Precautions / Restrictions Precautions Precautions: Fall    Mobility  Bed Mobility Overal bed mobility: Needs Assistance                Transfers Overall transfer level: Needs assistance Equipment used: Rolling walker (2 wheeled) Transfers: Sit to/from Stand Sit to Stand: Min guard Stand pivot transfers: +2 safety/equipment       General transfer comment: min guard for safety; dues for hand placement and pt needed assist in standing x2 standing trials due to orthostatics  Ambulation/Gait Ambulation/Gait assistance: Min guard;+2 safety/equipment Ambulation Distance (Feet): 40 Feet Assistive device: Rolling walker (2 wheeled) Gait Pattern/deviations: Step-through pattern Gait velocity: Decreased   General Gait Details: Similar events happened as in the last treatment.  Pt started out ambulating fine then started staggering and stopping, became pale and diaphoretic needing to sit down.  Second trial, pt started to stagger and pass out immediately,  3rd stand pt able to stand for BP which was likely trending down at 115/46.  Sitting BP after gait and incident was 137/68   Stairs            Wheelchair  Mobility    Modified Rankin (Stroke Patients Only)       Balance Overall balance assessment: No apparent balance deficits (not formally assessed)                                  Cognition Arousal/Alertness: Awake/alert Behavior During Therapy: WFL for tasks assessed/performed Overall Cognitive Status: Within Functional Limits for tasks assessed                      Exercises      General Comments        Pertinent Vitals/Pain Upon sitting after feeling she was going to pass out  BP 137/68.  Later standing BP (started immediately after standing) 115/46    Home Living                      Prior Function            PT Goals (current goals can now be found in the care plan section) Acute Rehab PT Goals Patient Stated Goal: to get stronger PT Goal Formulation: With patient Time For Goal Achievement: 08/05/14 Potential to Achieve Goals: Good Progress towards PT goals: Progressing toward goals    Frequency  Min 3X/week    PT Plan Current plan remains appropriate    Co-evaluation             End of Session   Activity Tolerance: Other (comment) (limited by dropping BP's) Patient left: in chair;with call bell/phone within reach;with nursing/sitter in room  Time: 8891-6945 PT Time Calculation (min): 33 min  Charges:  $Gait Training: 8-22 mins $Therapeutic Activity: 8-22 mins                    G Codes:      [name], Tessie Fass 07/25/2014, 12:43 PM 07/25/2014  Donnella Sham, PT [phone]  (pager)

## 2014-07-25 NOTE — Progress Notes (Signed)
Patient up walking with PT and, upon standing, had a couple episodes where she became very unsteady and stated that the room "started to go black".  After sitting back down, she returned to baseline and felt better.  Patient was slightly orthostatic going from sitting to standing, but otherwise VSS.  Patient now resting comfortably in chair with call bell in reach. MD notified. Will continue to monitor closely.

## 2014-07-25 NOTE — Progress Notes (Signed)
Rehab admissions - Evaluated for possible admission.  I spoke with Dr. Broadus John and Dr. Naaman Plummer.  We will admit to acute inpatient rehab tomorrow if patient is medically ready and LOS will be about 7 days.  Call me for questions.  #[phone]

## 2014-07-26 ENCOUNTER — Inpatient Hospital Stay (HOSPITAL_COMMUNITY)
Admission: RE | Admit: 2014-07-26 | Discharge: 2014-08-08 | DRG: 945 | Disposition: A | Discharge status: Skilled Nursing Facility | Payer: Medicare Other | Source: Intra-hospital | Attending: Physical Medicine & Rehabilitation | Admitting: Physical Medicine & Rehabilitation

## 2014-07-26 DIAGNOSIS — M545 Low back pain, unspecified: Secondary | ICD-10-CM | POA: Diagnosis present

## 2014-07-26 DIAGNOSIS — I209 Angina pectoris, unspecified: Secondary | ICD-10-CM | POA: Diagnosis present

## 2014-07-26 DIAGNOSIS — Z9181 History of falling: Secondary | ICD-10-CM

## 2014-07-26 DIAGNOSIS — G4733 Obstructive sleep apnea (adult) (pediatric): Secondary | ICD-10-CM | POA: Diagnosis present

## 2014-07-26 DIAGNOSIS — K2990 Gastroduodenitis, unspecified, without bleeding: Secondary | ICD-10-CM

## 2014-07-26 DIAGNOSIS — J9610 Chronic respiratory failure, unspecified whether with hypoxia or hypercapnia: Secondary | ICD-10-CM | POA: Diagnosis present

## 2014-07-26 DIAGNOSIS — I2699 Other pulmonary embolism without acute cor pulmonale: Secondary | ICD-10-CM

## 2014-07-26 DIAGNOSIS — D62 Acute posthemorrhagic anemia: Secondary | ICD-10-CM | POA: Diagnosis present

## 2014-07-26 DIAGNOSIS — F332 Major depressive disorder, recurrent severe without psychotic features: Secondary | ICD-10-CM | POA: Diagnosis present

## 2014-07-26 DIAGNOSIS — K922 Gastrointestinal hemorrhage, unspecified: Secondary | ICD-10-CM | POA: Diagnosis present

## 2014-07-26 DIAGNOSIS — Z515 Encounter for palliative care: Secondary | ICD-10-CM | POA: Diagnosis not present

## 2014-07-26 DIAGNOSIS — G4700 Insomnia, unspecified: Secondary | ICD-10-CM | POA: Diagnosis present

## 2014-07-26 DIAGNOSIS — G8929 Other chronic pain: Secondary | ICD-10-CM | POA: Diagnosis present

## 2014-07-26 DIAGNOSIS — Z87440 Personal history of urinary (tract) infections: Secondary | ICD-10-CM | POA: Diagnosis not present

## 2014-07-26 DIAGNOSIS — K2970 Gastritis, unspecified, without bleeding: Secondary | ICD-10-CM | POA: Diagnosis present

## 2014-07-26 DIAGNOSIS — F410 Panic disorder [episodic paroxysmal anxiety] without agoraphobia: Secondary | ICD-10-CM | POA: Diagnosis present

## 2014-07-26 DIAGNOSIS — I10 Essential (primary) hypertension: Secondary | ICD-10-CM | POA: Diagnosis not present

## 2014-07-26 DIAGNOSIS — Z77090 Contact with and (suspected) exposure to asbestos: Secondary | ICD-10-CM

## 2014-07-26 DIAGNOSIS — R2689 Other abnormalities of gait and mobility: Secondary | ICD-10-CM | POA: Diagnosis present

## 2014-07-26 DIAGNOSIS — J449 Chronic obstructive pulmonary disease, unspecified: Secondary | ICD-10-CM | POA: Diagnosis present

## 2014-07-26 DIAGNOSIS — I5032 Chronic diastolic (congestive) heart failure: Secondary | ICD-10-CM | POA: Diagnosis present

## 2014-07-26 DIAGNOSIS — M949 Disorder of cartilage, unspecified: Secondary | ICD-10-CM

## 2014-07-26 DIAGNOSIS — Z5189 Encounter for other specified aftercare: Secondary | ICD-10-CM | POA: Diagnosis present

## 2014-07-26 DIAGNOSIS — Z7901 Long term (current) use of anticoagulants: Secondary | ICD-10-CM

## 2014-07-26 DIAGNOSIS — I509 Heart failure, unspecified: Secondary | ICD-10-CM | POA: Diagnosis present

## 2014-07-26 DIAGNOSIS — M6281 Muscle weakness (generalized): Secondary | ICD-10-CM | POA: Diagnosis not present

## 2014-07-26 DIAGNOSIS — Z7902 Long term (current) use of antithrombotics/antiplatelets: Secondary | ICD-10-CM

## 2014-07-26 DIAGNOSIS — Z634 Disappearance and death of family member: Secondary | ICD-10-CM | POA: Diagnosis not present

## 2014-07-26 DIAGNOSIS — R269 Unspecified abnormalities of gait and mobility: Secondary | ICD-10-CM | POA: Diagnosis present

## 2014-07-26 DIAGNOSIS — Z9981 Dependence on supplemental oxygen: Secondary | ICD-10-CM | POA: Diagnosis not present

## 2014-07-26 DIAGNOSIS — J4489 Other specified chronic obstructive pulmonary disease: Secondary | ICD-10-CM | POA: Diagnosis present

## 2014-07-26 DIAGNOSIS — I2782 Chronic pulmonary embolism: Secondary | ICD-10-CM | POA: Diagnosis present

## 2014-07-26 DIAGNOSIS — R262 Difficulty in walking, not elsewhere classified: Secondary | ICD-10-CM | POA: Diagnosis not present

## 2014-07-26 DIAGNOSIS — M899 Disorder of bone, unspecified: Secondary | ICD-10-CM | POA: Diagnosis present

## 2014-07-26 DIAGNOSIS — M797 Fibromyalgia: Secondary | ICD-10-CM

## 2014-07-26 DIAGNOSIS — F411 Generalized anxiety disorder: Secondary | ICD-10-CM | POA: Diagnosis not present

## 2014-07-26 DIAGNOSIS — R42 Dizziness and giddiness: Secondary | ICD-10-CM | POA: Diagnosis present

## 2014-07-26 DIAGNOSIS — Z79899 Other long term (current) drug therapy: Secondary | ICD-10-CM | POA: Diagnosis not present

## 2014-07-26 DIAGNOSIS — D649 Anemia, unspecified: Secondary | ICD-10-CM | POA: Diagnosis present

## 2014-07-26 DIAGNOSIS — F4321 Adjustment disorder with depressed mood: Secondary | ICD-10-CM | POA: Diagnosis not present

## 2014-07-26 DIAGNOSIS — M1710 Unilateral primary osteoarthritis, unspecified knee: Secondary | ICD-10-CM | POA: Diagnosis present

## 2014-07-26 DIAGNOSIS — R5381 Other malaise: Secondary | ICD-10-CM | POA: Diagnosis not present

## 2014-07-26 DIAGNOSIS — IMO0001 Reserved for inherently not codable concepts without codable children: Secondary | ICD-10-CM | POA: Diagnosis present

## 2014-07-26 DIAGNOSIS — E7800 Pure hypercholesterolemia, unspecified: Secondary | ICD-10-CM | POA: Diagnosis present

## 2014-07-26 DIAGNOSIS — IMO0002 Reserved for concepts with insufficient information to code with codable children: Secondary | ICD-10-CM | POA: Diagnosis present

## 2014-07-26 DIAGNOSIS — F341 Dysthymic disorder: Secondary | ICD-10-CM

## 2014-07-26 DIAGNOSIS — I2510 Atherosclerotic heart disease of native coronary artery without angina pectoris: Secondary | ICD-10-CM | POA: Diagnosis present

## 2014-07-26 DIAGNOSIS — R488 Other symbolic dysfunctions: Secondary | ICD-10-CM | POA: Diagnosis not present

## 2014-07-26 DIAGNOSIS — I119 Hypertensive heart disease without heart failure: Secondary | ICD-10-CM | POA: Diagnosis not present

## 2014-07-26 LAB — COMPREHENSIVE METABOLIC PANEL
ALBUMIN: 3.1 g/dL — AB (ref 3.5–5.2)
ALT: 11 U/L (ref 0–35)
AST: 15 U/L (ref 0–37)
Alkaline Phosphatase: 59 U/L (ref 39–117)
Anion gap: 11 (ref 5–15)
BUN: 12 mg/dL (ref 6–23)
CHLORIDE: 103 meq/L (ref 96–112)
CO2: 28 meq/L (ref 19–32)
Calcium: 8.9 mg/dL (ref 8.4–10.5)
Creatinine, Ser: 0.82 mg/dL (ref 0.50–1.10)
GFR calc Af Amer: 83 mL/min — ABNORMAL LOW (ref >90)
GFR calc non Af Amer: 72 mL/min — ABNORMAL LOW (ref >90)
Glucose, Bld: 88 mg/dL (ref 70–99)
Potassium: 3.8 mEq/L (ref 3.7–5.3)
Sodium: 142 mEq/L (ref 137–147)
Total Bilirubin: 0.3 mg/dL (ref 0.3–1.2)
Total Protein: 5.9 g/dL — ABNORMAL LOW (ref 6.0–8.3)

## 2014-07-26 LAB — CBC
HEMATOCRIT: 32.5 % — AB (ref 36.0–46.0)
Hemoglobin: 10.1 g/dL — ABNORMAL LOW (ref 12.0–15.0)
MCH: 28.6 pg (ref 26.0–34.0)
MCHC: 31.1 g/dL (ref 30.0–36.0)
MCV: 92.1 fL (ref 78.0–100.0)
Platelets: 191 10*3/uL (ref 150–400)
RBC: 3.53 MIL/uL — AB (ref 3.87–5.11)
RDW: 15.7 % — AB (ref 11.5–15.5)
WBC: 6.2 10*3/uL (ref 4.0–10.5)

## 2014-07-26 MED ORDER — TRAZODONE HCL 50 MG PO TABS
100.0000 mg | ORAL_TABLET | Freq: Every evening | ORAL | Status: DC | PRN
  Administered 2014-07-26: 100 mg via ORAL
  Filled 2014-07-26: qty 2

## 2014-07-26 MED ORDER — PREGABALIN 50 MG PO CAPS
200.0000 mg | ORAL_CAPSULE | Freq: Every day | ORAL | Status: DC
  Administered 2014-07-27 – 2014-08-08 (×13): 200 mg via ORAL
  Filled 2014-07-26 (×13): qty 4

## 2014-07-26 MED ORDER — PAROXETINE HCL 20 MG PO TABS
20.0000 mg | ORAL_TABLET | Freq: Every day | ORAL | Status: DC
  Administered 2014-07-27 – 2014-08-02 (×6): 20 mg via ORAL
  Filled 2014-07-26 (×8): qty 1

## 2014-07-26 MED ORDER — OXYCODONE-ACETAMINOPHEN 5-325 MG PO TABS
2.0000 | ORAL_TABLET | ORAL | Status: DC | PRN
  Administered 2014-07-26 – 2014-08-02 (×30): 2 via ORAL
  Filled 2014-07-26 (×30): qty 2

## 2014-07-26 MED ORDER — PANTOPRAZOLE SODIUM 40 MG PO TBEC
40.0000 mg | DELAYED_RELEASE_TABLET | Freq: Every day | ORAL | Status: DC
  Administered 2014-07-27 – 2014-08-08 (×13): 40 mg via ORAL
  Filled 2014-07-26 (×13): qty 1

## 2014-07-26 MED ORDER — AMITRIPTYLINE HCL 100 MG PO TABS
100.0000 mg | ORAL_TABLET | Freq: Every day | ORAL | Status: DC
  Administered 2014-07-26 – 2014-08-01 (×7): 100 mg via ORAL
  Filled 2014-07-26 (×8): qty 1

## 2014-07-26 MED ORDER — POLYETHYLENE GLYCOL 3350 17 G PO PACK: 17.0000 g | PACK | Freq: Every day | ORAL | Status: DC

## 2014-07-26 MED ORDER — FLEET ENEMA 7-19 GM/118ML RE ENEM: 1.0000 | ENEMA | Freq: Once | RECTAL | Status: AC | PRN

## 2014-07-26 MED ORDER — SENNOSIDES-DOCUSATE SODIUM 8.6-50 MG PO TABS
2.0000 | ORAL_TABLET | Freq: Two times a day (BID) | ORAL | Status: DC
  Administered 2014-07-26 – 2014-08-02 (×11): 2 via ORAL
  Filled 2014-07-26 (×14): qty 2

## 2014-07-26 MED ORDER — PREGABALIN 50 MG PO CAPS
400.0000 mg | ORAL_CAPSULE | Freq: Every day | ORAL | Status: DC
  Administered 2014-07-26 – 2014-08-07 (×13): 400 mg via ORAL
  Filled 2014-07-26 (×13): qty 8

## 2014-07-26 MED ORDER — GUAIFENESIN-DM 100-10 MG/5ML PO SYRP: 5.0000 mL | ORAL_SOLUTION | Freq: Four times a day (QID) | ORAL | Status: DC | PRN

## 2014-07-26 MED ORDER — LIDOCAINE 5 % EX PTCH
1.0000 | MEDICATED_PATCH | CUTANEOUS | Status: DC
  Administered 2014-07-27 – 2014-08-03 (×8): 1 via TRANSDERMAL
  Filled 2014-07-26 (×9): qty 1

## 2014-07-26 MED ORDER — DIPHENHYDRAMINE HCL 12.5 MG/5ML PO ELIX: 12.5000 mg | ORAL_SOLUTION | Freq: Four times a day (QID) | ORAL | Status: DC | PRN

## 2014-07-26 MED ORDER — ONDANSETRON HCL 4 MG/2ML IJ SOLN: 4.0000 mg | Freq: Four times a day (QID) | INTRAMUSCULAR | Status: DC | PRN

## 2014-07-26 MED ORDER — FOLIC ACID 1 MG PO TABS
2.0000 mg | ORAL_TABLET | Freq: Every day | ORAL | Status: DC
  Administered 2014-07-27 – 2014-08-08 (×13): 2 mg via ORAL
  Filled 2014-07-26 (×14): qty 2

## 2014-07-26 MED ORDER — ONDANSETRON HCL 4 MG PO TABS
4.0000 mg | ORAL_TABLET | Freq: Four times a day (QID) | ORAL | Status: DC | PRN
  Administered 2014-07-26: 4 mg via ORAL
  Filled 2014-07-26: qty 1

## 2014-07-26 MED ORDER — SENNOSIDES-DOCUSATE SODIUM 8.6-50 MG PO TABS: 2.0000 | ORAL_TABLET | Freq: Every evening | ORAL | Status: DC | PRN

## 2014-07-26 MED ORDER — POLYETHYLENE GLYCOL 3350 17 G PO PACK
17.0000 g | PACK | Freq: Two times a day (BID) | ORAL | Status: DC
  Administered 2014-07-26 – 2014-07-31 (×10): 17 g via ORAL
  Filled 2014-07-26 (×22): qty 1

## 2014-07-26 MED ORDER — ALBUTEROL SULFATE (2.5 MG/3ML) 0.083% IN NEBU: 2.5000 mg | INHALATION_SOLUTION | Freq: Four times a day (QID) | RESPIRATORY_TRACT | Status: DC | PRN

## 2014-07-26 MED ORDER — BISACODYL 10 MG RE SUPP: 10.0000 mg | Freq: Every day | RECTAL | Status: DC | PRN

## 2014-07-26 MED ORDER — NEBIVOLOL HCL 5 MG PO TABS
5.0000 mg | ORAL_TABLET | Freq: Every day | ORAL | Status: DC
  Administered 2014-07-27 – 2014-08-08 (×13): 5 mg via ORAL
  Filled 2014-07-26 (×17): qty 1

## 2014-07-26 MED ORDER — ASPIRIN EC 81 MG PO TBEC: 81.0000 mg | DELAYED_RELEASE_TABLET | Freq: Every day | ORAL | Status: DC

## 2014-07-26 MED ORDER — NITROGLYCERIN 0.4 MG SL SUBL: 0.4000 mg | SUBLINGUAL_TABLET | SUBLINGUAL | Status: DC | PRN

## 2014-07-26 MED ORDER — ALUM & MAG HYDROXIDE-SIMETH 200-200-20 MG/5ML PO SUSP: 30.0000 mL | ORAL | Status: DC | PRN

## 2014-07-26 MED ORDER — PAROXETINE HCL 20 MG PO TABS: 20.0000 mg | ORAL_TABLET | Freq: Every day | ORAL | Status: DC

## 2014-07-26 MED ORDER — ALPRAZOLAM 0.5 MG PO TABS
0.5000 mg | ORAL_TABLET | Freq: Three times a day (TID) | ORAL | Status: DC | PRN
  Administered 2014-07-27 – 2014-08-03 (×8): 1 mg via ORAL
  Filled 2014-07-26 (×8): qty 2

## 2014-07-26 MED ORDER — ATORVASTATIN CALCIUM 20 MG PO TABS
20.0000 mg | ORAL_TABLET | Freq: Every day | ORAL | Status: DC
  Administered 2014-07-26 – 2014-08-07 (×13): 20 mg via ORAL
  Filled 2014-07-26 (×14): qty 1

## 2014-07-26 NOTE — Progress Notes (Signed)
Physical Medicine and Rehabilitation Consult  Reason for Consult: Deconditioning  Referring Physician: Dr. Broadus John  HPI: Linda [name] is a 68 y.o. female with history of HTN, asbestosis--oxygen depenedent, chronic pain, anemia, recent UTI who was admitted on 07/17/14 with orthostatic episodes for past 2 weeks with onset of nose bleed X 2 days with multiple falls. She on chronic coumadin, plavix and meloxicam and was found to be anemic with Hgb 5.1 and INR >10.0. CT head with right petrous bone mass and CT abdomen/pelvis without retroperitoneal hematoma. She was transfused with 2 units PRBC and treated with FFP and Vit K. Patient placed on IV PPI and underwent EGD revealing gastritis as well as biopsy of gastric nodule on 07/28. Hem/Onc consulted for input on need for anticoagulation and felt that coumadin not needed as CT chest earlier this year as well as BLE dopplers negative. Dr. Terrence Dupont consulted for input and recommended low dose ASA once cleared per GI. Palliative care consulted for support due to grief/loss of husband as well as anxiety/agitation/pain issues and provided ego support as well as Resources on support groups. PT ongoing and patient limited by deconditioning as well as orthostatic symptoms. MD, PT recommending CIR for progressive therapies.  Review of Systems  Constitutional: Positive for malaise/fatigue.  HENT: Positive for tinnitus. Negative for hearing loss.  Eyes: Negative for blurred vision and double vision.  Respiratory: Positive for shortness of breath.  Cardiovascular: Negative for chest pain and palpitations.  Gastrointestinal: Negative for heartburn and nausea.  Musculoskeletal: Positive for back pain and myalgias (RLE pain).  Radiculopathy RLE.  Neurological: Positive for sensory change (right 4/5th finger and hand due to nerve injury.). Negative for headaches.  Psychiatric/Behavioral: Positive for depression. The patient is nervous/anxious.  Past Medical History    Diagnosis  Date   .  HTN (hypertension)    .  Depression    .  Recurrent UTI      +hx of hospitalization for pyelonephritis   .  Hay fever    .  Mixed incontinence urge and stress    .  Diverticular disease    .  Insomnia    .  Fibromyalgia      Patient states dx was around her late 38s but she had sx's for years prior to this.   .  Syncope      +Hypotensive; ED visit--Dr. Terrence Dupont did Cath--nonobstructive CAD, EF 55-60%   .  Idiopathic angio-edema-urticaria  72014     Angioedema component was very minimal   .  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  Asthma      + asbestososis   .  History of pneumonia      hospitalized 12/2011, 02/2013, and 07/2013 [facility]) for this   .  High cholesterol      "I'm on Lipitor cause it will help my heart in some way" (07/12/2013)   .  Anginal pain      Nonobstructive CAD 2014   .  OSA on CPAP    .  Iron deficiency anemia      Hematologist in [place], [place] did extensive w/u; no cause found; failed oral supplement;; gets fairly regular (q32m or so) IV iron infusions (Venofer 200mg  with procrit. "for 14 yr I've been getting blood work q month & getting infusions prn" (07/12/2013). Dr. Marin Olp locally, iron infusions done, then erythropoietin deficiency diagnosed:  aranesp to be started as of 09/2013.   .  H/O hiatal hernia    .  Migraine syndrome      "not as often anymore; used to be ~ q wk" (07/12/2013)   .  Tension headache, chronic    .  DDD (degenerative disc disease)      lumbar and cervical.   .  Osteoarthritis      "severe; progressing fast" (07/12/2013)   .  Chronic lower back pain    .  Anxiety      panic attacks   .  Nephrolithiasis      "passed all on my own or they are still in there" (07/12/2013)   .  Pyelonephritis      "several times over the last 30 yr" (07/12/2013)   .  Diastolic congestive heart failure    .  COPD (chronic obstructive  pulmonary disease)    .  Pulmonary embolism  07/2013     coumadin needed x 6 mo; Dx at [facility] with very small peripheral upper lobe pe 07/2013   .  Pleural plaque with presence of asbestos  07/22/2013   .  BPPV (benign paroxysmal positional vertigo)  12/16/2012   .  RBBB (right bundle branch block)     Past Surgical History   Procedure  Laterality  Date   .  Appendectomy   1960   .  Total abdominal hysterectomy   1974   .  Tendon release   1996     Right forearm and hand   .  Knee surgery   2005   .  Heel spur surgery  Left  2008   .  Plantar fascia release  Left  2008   .  Axillary surgery  Left  1978     Multiple "lump" in armpit per pt   .  Coccyx removal   1972   .  Cardiac catheterization   01/2013     nonobstructive CAD, EF 55-60%   .  Transthoracic echocardiogram   01/2013; 04/2014     2014--NORMAL. 2015--focal basal septal hypertrophy, EF 55-60%, grade I diast dysfxn, mild LAE.   .  Dilation and curettage of uterus   ? 1970's   .  Eye surgery  Left  2012-2013     "injections for ~ 1 yr; don't really know what for" (07/12/2013)   .  Spirometry   04/25/14     In hosp for acute asthma/COPD flare: mixed obstructive and restrictive lung disease. The FEV1 is severely reduced at 45% predicted. FEV1 signif decreased compared to prior spirometry 07/23/13.   [name] Kitchen  Esophagogastroduodenoscopy  N/A  07/19/2014     Procedure: ESOPHAGOGASTRODUODENOSCOPY ; Surgeon: Ladene Artist, MD; Location: [facility]; Service: Endoscopy; Laterality: N/A;    Family History   Problem  Relation  Age of Onset   .  Arthritis  Mother    .  Kidney disease  Mother    .  Heart disease  Father    .  Stroke  Father    .  Hypertension  Father    .  Diabetes  Father    Social History: Lives alone. Retired Pharmacist, hospital. Independent without AD.... Furniture walks at home. She reports that she has never smoked. She has never used smokeless tobacco. She reports that she does not drink alcohol or use illicit drugs.  Allergies    Allergen  Reactions   .  Penicillins  Itching, Swelling and Rash  Tolerated Cefepime in ED.    Medications Prior to Admission   Medication  Sig  Dispense  Refill   .  albuterol (PROVENTIL HFA;VENTOLIN HFA) 108 (90 BASE) MCG/ACT inhaler  Inhale 2 puffs into the lungs every 6 (six) hours as needed for wheezing or shortness of breath.     [name] Kitchen  albuterol (PROVENTIL) (2.5 MG/3ML) 0.083% nebulizer solution  Take 3 mLs (2.5 mg total) by nebulization every 6 (six) hours as needed for wheezing or shortness of breath.  75 mL  1   .  ALPRAZolam (XANAX) 1 MG tablet  Take 0.5-1 mg by mouth 3 (three) times daily as needed for anxiety or sleep.     [name] Kitchen  amitriptyline (ELAVIL) 100 MG tablet  Take 100 mg by mouth at bedtime.     [name] Kitchen  atorvastatin (LIPITOR) 20 MG tablet  Take 1 tablet (20 mg total) by mouth daily at 6 PM.  30 tablet  6   .  ciprofloxacin (CIPRO) 500 MG tablet  Take 1 tablet (500 mg total) by mouth 2 (two) times daily.  10 tablet  0   .  clopidogrel (PLAVIX) 75 MG tablet  Take 75 mg by mouth daily.     .  furosemide (LASIX) 20 MG tablet  Take 20 mg by mouth daily.     .  isosorbide mononitrate (IMDUR) 30 MG 24 hr tablet  Take 30 mg by mouth daily.     .  meloxicam (MOBIC) 7.5 MG tablet  Take 7.5 mg by mouth 2 (two) times daily.     .  Milnacipran (SAVELLA) 50 MG TABS tablet  Take 50 mg by mouth 2 (two) times daily.     .  Multiple Vitamins-Minerals (CENTRUM SILVER) tablet  Take 1 tablet by mouth daily.     .  nebivolol (BYSTOLIC) 5 MG tablet  Take 5 mg by mouth daily.     .  nitroGLYCERIN (NITROSTAT) 0.4 MG SL tablet  Place 1 tablet (0.4 mg total) under the tongue every 5 (five) minutes x 3 doses as needed for chest pain.  60 tablet  3   .  phenazopyridine (PYRIDIUM) 200 MG tablet  Take 1 tablet (200 mg total) by mouth 3 (three) times daily as needed for pain.  6 tablet  0   .  pregabalin (LYRICA) 200 MG capsule  Take 200-400 mg by mouth 3 (three) times daily. Take 1 capsule in the morning and  supper and 2 capsule at bedtime     .  traZODone (DESYREL) 50 MG tablet  Take 100-200 mg by mouth at bedtime as needed (for insomnia).     .  warfarin (COUMADIN) 5 MG tablet  Take 10 mg by mouth daily.     Home:  Home Living  Family/patient expects to be discharged to:: Private residence  Living Arrangements: Alone  Available Help at Discharge: Family;Friend(s);Available PRN/intermittently  Type of Home: House  Home Access: Stairs to enter  [employer] of Steps: 3-4, has 8 at other entrance but doesn't use  Entrance Stairs-Rails: Can reach both  Home Layout: One level  Home Equipment: Cane - single point;Walker - 2 wheels;Bedside commode;Grab bars - toilet;Grab bars - tub/shower;Hand held shower head  Functional History:  Prior Function  Level of Independence: Independent  Comments: (Slower but can still get around, use when needed)  Functional Status:  Mobility:  Bed Mobility  Overal bed mobility: Needs Assistance  Bed Mobility: Sit to Supine  Supine to sit: Supervision  General bed mobility comments: Pt required no (A) for bed mobility.  Transfers  Overall transfer level: Needs assistance  Equipment used: Rolling walker (2 wheeled)  Transfers: Sit to/from [facility]  Sit to Stand: Min guard  Stand pivot transfers: Min guard  General transfer comment: Min guard for safety. No VC's needed for hand placement.  Ambulation/Gait  Ambulation/Gait assistance: +2 safety/equipment;Min guard  Ambulation Distance (Feet): 35 Feet  Assistive device: Rolling walker (2 wheeled)  Gait Pattern/deviations: Step-through pattern;Staggering left;Staggering right  Gait velocity: Decreased  Gait velocity interpretation: Below normal speed for age/gender  General Gait Details: +2 safety/equiptment for chair to follow and O2 and IV pole. Walked 59' before becoming lightheaded and needing to sit down. Pt begins staggering and needs chair to follow for safety when lightheaded  and needs to sit down immediately. Attempted to rest and walk again as pt motivated but pt not responding to questions and rolled pt back to room in chair. Pt subjectively states heart feels like it's fluttering. BP after amb 148/64 and HR 83. Nurse notified and will communicate with MD.  ADL:  ADL  Overall ADL's : Needs assistance/impaired  Eating/Feeding: Independent;Sitting  Grooming: Min guard;Standing  Upper Body Bathing: Set up;Sitting  Lower Body Bathing: Min guard;Sit to/from stand  Upper Body Dressing : Set up;Sitting  Lower Body Dressing: Min guard;Sit to/from stand  Toilet Transfer: Min guard;Stand-pivot;BSC;RW  Toileting- Water quality scientist and Hygiene: Min guard;Sit to/from stand  General ADL Comments: Pt moves slowly and cautiously however is asymptomatic at present. Educated and discussed with pt extensively on CIR vs. SNF. vs Home with home health. Educated pt on fall prevention and increasing safety with ADLs at home through energy conservation principles.  Cognition:  Cognition  Overall Cognitive Status: Within Functional Limits for tasks assessed  Orientation Level: Oriented X4  Cognition  Arousal/Alertness: Awake/alert  Behavior During Therapy: WFL for tasks assessed/performed  Overall Cognitive Status: Within Functional Limits for tasks assessed  Blood pressure 177/87, pulse 75, temperature 97.6 F (36.4 C), temperature source Oral, resp. rate 17, height 5\' 1"  (1.549 m), weight 82.2 kg (181 lb 3.5 oz), SpO2 96.00%.  Physical Exam  Nursing note and vitals reviewed.  Constitutional: She is oriented to person, place, and time. She appears well-developed and well-nourished.  Pale and anxious appearing.  HENT:  Head: Normocephalic and atraumatic.  Eyes: Conjunctivae are normal. Pupils are equal, round, and reactive to light.  Neck: Normal range of motion. Neck supple.  Cardiovascular: Normal rate and regular rhythm.  Respiratory: Effort normal and breath sounds  normal. No respiratory distress. She has no wheezes.  GI: Soft. Bowel sounds are normal. She exhibits no distension. There is no tenderness.  Neurological: She is alert and oriented to person, place, and time. Mild sensory loss over right foot and ulnar hand. Strength grossly 4+/5 in all 4 limbs. Normal rom and fmc. Cognitively displays good insight and awareness.  Skin: Skin is warm and dry.  Psychiatric: Her speech is normal and behavior is normal. Judgment normal. Her mood appears anxious. She is not slowed and not withdrawn. Cognition and memory are normal.  Mentioned loss of husband couple of time during conversation.  Results for orders placed during the hospital encounter of 07/17/14 (from the past 24 hour(s))   CBC Status: Abnormal    Collection Time    07/25/14 5:05 AM   Result  Value  Ref Range    WBC  6.1  4.0 - 10.5 K/uL  RBC  3.56 (*)  3.87 - 5.11 MIL/uL    Hemoglobin  10.3 (*)  12.0 - 15.0 g/dL    HCT  32.3 (*)  36.0 - 46.0 %    MCV  90.7  78.0 - 100.0 fL    MCH  28.9  26.0 - 34.0 pg    MCHC  31.9  30.0 - 36.0 g/dL    RDW  15.6 (*)  11.5 - 15.5 %    Platelets  223  150 - 400 K/uL   COMPREHENSIVE METABOLIC PANEL Status: Abnormal    Collection Time    07/25/14 5:05 AM   Result  Value  Ref Range    Sodium  145  137 - 147 mEq/L    Potassium  3.7  3.7 - 5.3 mEq/L    Chloride  104  96 - 112 mEq/L    CO2  30  19 - 32 mEq/L    Glucose, Bld  87  70 - 99 mg/dL    BUN  12  6 - 23 mg/dL    Creatinine, Ser  0.78  0.50 - 1.10 mg/dL    Calcium  9.0  8.4 - 10.5 mg/dL    Total Protein  6.3  6.0 - 8.3 g/dL    Albumin  3.2 (*)  3.5 - 5.2 g/dL    AST  13  0 - 37 U/L    ALT  9  0 - 35 U/L    Alkaline Phosphatase  61  39 - 117 U/L    Total Bilirubin  0.3  0.3 - 1.2 mg/dL    GFR calc non Af Amer  84 (*)  >90 mL/min    GFR calc Af Amer  >90  >90 mL/min    Anion gap  11  5 - 15   RETICULOCYTES Status: Abnormal    Collection Time    07/25/14 5:05 AM   Result  Value  Ref Range     Retic Ct Pct  4.3 (*)  0.4 - 3.1 %    RBC.  3.56 (*)  3.87 - 5.11 MIL/uL    Retic Count, Manual  153.1  19.0 - 186.0 K/uL   No results found.  Assessment/Plan:  Diagnosis: gait disorder related to multiple medical and psychosocial issues  1. Does the need for close, 24 hr/day medical supervision in concert with the patient's rehab needs make it unreasonable for this patient to be served in a less intensive setting? No and Potentially 2. Co-Morbidities requiring supervision/potential complications: htn, fibromyalgia 3. Due to bladder management, bowel management, safety, skin/wound care, disease management, medication administration, pain management and patient education, does the patient require 24 hr/day rehab nursing? No and Potentially 4. Does the patient require coordinated care of a physician, rehab nurse, PT, OT to address physical and functional deficits in the context of the above medical diagnosis(es)? No and Potentially Addressing deficits in the following areas: balance, endurance, locomotion, strength, transferring, bowel/bladder control, bathing, dressing, feeding, grooming and toileting 5. Can the patient actively participate in an intensive therapy program of at least 3 hrs of therapy per day at least 5 days per week? Yes 6. The potential for patient to make measurable gains while on inpatient rehab is fair 7. Anticipated functional outcomes upon discharge from inpatient rehab are modified independent with PT, modified independent with OT, n/a with SLP. 8. Estimated rehab length of stay to reach the above functional goals is: to be determined 9. Does the patient have  adequate social supports to accommodate these discharge functional goals? Potentially 10. Anticipated D/C setting: Home 11. Anticipated post D/C treatments: [employer] therapy 12. Overall Rehab/Functional Prognosis: good RECOMMENDATIONS:  This patient's condition is appropriate for continued rehabilitative care in the  following setting: SNF and [facility]  Patient has agreed to participate in recommended program. Potentially  Note that insurance prior authorization may be required for reimbursement for recommended care.  Comment: There isn't much medically to justify an inpatient rehab stay. Also, she is at a contact guard assistance level with therapy. Would like to see her follow up therapy assessments from later this morning, but at this point she is probably too high level all around to justify inpatient rehab.  Meredith Staggers, MD, [facility]  07/25/2014  Routing History.[name] KitchenMarland Kitchen

## 2014-07-26 NOTE — Progress Notes (Signed)
PMR Admission Coordinator Pre-Admission Assessment  Patient: Linda [name] is an 68 y.o., female  MRN: [id number]  DOB: [birth year]  Height: 5\' 1"  (154.9 cm)  Weight: 62.1 kg (136 lb 14.5 oz)  Insurance Information  HMO: PPO: PCP: IPA: 80/20: OTHER:  PRIMARY: Medicare A/B Policy#: [id number] Subscriber: Vivi Barrack  CM Name: Phone#: Fax#:  Pre-Cert#: Employer: Retired  Benefits: Phone #: Name: Checked in [place]. Date: 02/21/11 Deduct: $1260 Out of Pocket Max: none Life Max: unlimited  CIR: 100% SNF: 100 days  Outpatient: 80% Co-Pay: 20%  Home Health: 100% Co-Pay: none  DME: 80% Co-Pay: 20%  Providers: patient's choice   SECONDARY: BCBS PPO out of state Policy#: [id number] Subscriber: Vivi Barrack  CM Name: Phone#: Fax#:  Pre-Cert#: Employer: Retired  Benefits: Phone #: [phone] Name:  Eff. Date: Deduct: Out of Pocket Max: Life Max:  CIR: SNF:  Outpatient: Co-Pay:  Home Health: Co-Pay:  DME: Co-Pay:  Emergency Contact Information  Contact Information    Name  Relation  Home  Work  Mobile    Lankin  Son    [phone]    Welford Roche  Daughter    [phone]      Current Medical History  Patient Admitting Diagnosis: Gait disorder  History of Present Illness: A 68 y.o. female with history of HTN, asbestosis--oxygen depenedent, chronic pain, anemia, recent UTI who was admitted on 07/17/14 with orthostatic episodes for past 2 weeks with onset of nose bleed X 2 days with multiple falls. She on chronic coumadin, plavix and meloxicam and was found to be anemic with Hgb 5.1 and INR >10.0. CT head with right petrous bone mass and CT abdomen/pelvis without retroperitoneal hematoma. She was transfused with 2 units PRBC and treated with FFP and Vit K. Patient placed on IV PPI and underwent EGD revealing gastritis as well as biopsy of gastric nodule on 07/28. Hem/Onc consulted for input on need for anticoagulation and felt that coumadin not needed as CT chest  earlier this year as well as BLE dopplers negative. Dr. Terrence Dupont consulted for input and recommended low dose ASA once cleared per GI. Palliative care consulted for support due to grief/loss of husband as well as anxiety/agitation/pain issues and provided ego support as well as Resources on support groups. PT ongoing and patient limited by deconditioning as well as orthostatic symptoms. MD, PT recommending CIR for progressive therapies.  Past Medical History  Past Medical History   Diagnosis  Date   .  HTN (hypertension)    .  Depression    .  Recurrent UTI      +hx of hospitalization for pyelonephritis   .  Hay fever    .  Mixed incontinence urge and stress    .  Diverticular disease    .  Insomnia    .  Fibromyalgia      Patient states dx was around her late 30s but she had sx's for years prior to this.   .  Syncope      +Hypotensive; ED visit--Dr. Terrence Dupont did Cath--nonobstructive CAD, EF 55-60%   .  Idiopathic angio-edema-urticaria  72014     Angioedema component was very minimal   .  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  Asthma      + asbestososis   .  History of pneumonia      hospitalized 12/2011, 02/2013,  and 07/2013 [facility]) for this   .  High cholesterol      "I'm on Lipitor cause it will help my heart in some way" (07/12/2013)   .  Anginal pain      Nonobstructive CAD 2014   .  OSA on CPAP    .  Iron deficiency anemia      Hematologist in [place], [place] did extensive w/u; no cause found; failed oral supplement;; gets fairly regular (q39m or so) IV iron infusions (Venofer (iron sucrose) 200mg  with procrit. "for 14 yr I've been getting blood work q month & getting infusions prn" (07/12/2013). Dr. Marin Olp locally, iron infusions done, then erythropoietin deficiency diagnosed: aranesp to be started as of 09/2013.   .  H/O hiatal hernia    .  Migraine syndrome      "not as often anymore;  used to be ~ q wk" (07/12/2013)   .  Tension headache, chronic    .  DDD (degenerative disc disease)      lumbar and cervical.   .  Osteoarthritis      "severe; progressing fast" (07/12/2013)   .  Chronic lower back pain    .  Anxiety      panic attacks   .  Nephrolithiasis      "passed all on my own or they are still in there" (07/12/2013)   .  Pyelonephritis      "several times over the last 30 yr" (07/12/2013)   .  Diastolic congestive heart failure    .  COPD (chronic obstructive pulmonary disease)    .  Pulmonary embolism  07/2013     coumadin needed x 6 mo; Dx at [facility] with very small peripheral upper lobe pe 07/2013   .  Pleural plaque with presence of asbestos  07/22/2013   .  BPPV (benign paroxysmal positional vertigo)  12/16/2012   .  RBBB (right bundle branch block)    Family History  family history includes Arthritis in her mother; Diabetes in her father; Heart disease in her father; Hypertension in her father; Kidney disease in her mother; Stroke in her father.  Prior Rehab/Hospitalizations: Had [place] [facility] 2 yrs ago for about 3 weeks.  Current Medications  Current facility-administered medications:acetaminophen (TYLENOL) suppository 650 mg, 650 mg, Rectal, Q6H PRN, Rise Patience, MD; acetaminophen (TYLENOL) tablet 650 mg, 650 mg, Oral, Q6H PRN, Rise Patience, MD; albuterol (PROVENTIL) (2.5 MG/3ML) 0.083% nebulizer solution 2.5 mg, 2.5 mg, Nebulization, Q6H PRN, Rise Patience, MD  ALPRAZolam Duanne Moron) tablet 0.5-1 mg, 0.5-1 mg, Oral, TID PRN, Cherene Altes, MD, 1 mg at 07/22/14 1232; amitriptyline (ELAVIL) tablet 100 mg, 100 mg, Oral, QHS, Cherene Altes, MD, 100 mg at 07/25/14 2237; atorvastatin (LIPITOR) tablet 20 mg, 20 mg, Oral, q1800, Cherene Altes, MD, 20 mg at 38/18/29 9371; folic acid (FOLVITE) tablet 2 mg, 2 mg, Oral, Daily, Volanda Napoleon, MD, 2 mg at 07/26/14 1028  lidocaine (LIDODERM) 5 % 1 patch, 1 patch, Transdermal, I96V, Pershing Proud, NP,  1 patch at 07/26/14 1027; nebivolol (BYSTOLIC) tablet 5 mg, 5 mg, Oral, Daily, Samella Parr, NP, 5 mg at 07/26/14 1027; nitroGLYCERIN (NITROSTAT) SL tablet 0.4 mg, 0.4 mg, Sublingual, Q5 Min x 3 PRN, Rise Patience, MD; ondansetron [facility]) injection 4 mg, 4 mg, Intravenous, Q6H PRN, Rise Patience, MD, 4 mg at 07/25/14 1305  ondansetron (ZOFRAN) tablet 4 mg, 4 mg, Oral, Q6H PRN, Rise Patience, MD; oxyCODONE-acetaminophen (PERCOCET/ROXICET)  5-325 MG per tablet 2 tablet, 2 tablet, Oral, Q4H PRN, Samella Parr, NP, 2 tablet at 07/26/14 0459; pantoprazole (PROTONIX) EC tablet 40 mg, 40 mg, Oral, Q0600, Ladene Artist, MD, 40 mg at 07/26/14 7858; PARoxetine (PAXIL) tablet 20 mg, 20 mg, Oral, Daily, Allie Bossier, MD, 20 mg at 07/26/14 1027  polyethylene glycol (MIRALAX / GLYCOLAX) packet 17 g, 17 g, Oral, Daily, Domenic Polite, MD, 17 g at 07/25/14 0955; pregabalin (LYRICA) capsule 200 mg, 200 mg, Oral, Daily, Allie Bossier, MD, 200 mg at 07/26/14 1049; pregabalin (LYRICA) capsule 400 mg, 400 mg, Oral, QHS, Allie Bossier, MD, 400 mg at 07/25/14 2238; senna-docusate (Senokot-S) tablet 2 tablet, 2 tablet, Oral, BID, Domenic Polite, MD, 2 tablet at 07/26/14 1027  traZODone (DESYREL) tablet 100-200 mg, 100-200 mg, Oral, QHS PRN, Samella Parr, NP, 200 mg at 07/25/14 2357  Patients Current Diet: Criss Rosales  Precautions / Restrictions  Precautions  Precautions: Fall  Restrictions  Weight Bearing Restrictions: No  Prior Activity Level  Community (5-7x/wk): Goes out about 4 X a week.  Home Assistive Devices / Equipment  Home Assistive Devices/Equipment: Oxygen  Home Equipment: Cane - single point;Walker - 2 wheels;Bedside commode;Grab bars - toilet;Grab bars - tub/shower;Hand held shower head  Prior Functional Level  Prior Function  Level of Independence: Independent  Comments: (Slower but can still get around, use when needed)  Current Functional Level  Cognition  Overall Cognitive  Status: Within Functional Limits for tasks assessed  Orientation Level: Oriented X4   Extremity Assessment  (includes Sensation/Coordination)  Upper Extremity Assessment: Defer to OT evaluation  Lower Extremity Assessment: Generalized weakness (Not specifically tested, 3/5 supine against gravity)   ADLs  Overall ADL's : Needs assistance/impaired  Eating/Feeding: Independent;Sitting  Grooming: Min guard;Standing  Upper Body Bathing: Set up;Sitting  Lower Body Bathing: Min guard;Sit to/from stand  Upper Body Dressing : Set up;Sitting  Lower Body Dressing: Min guard;Sit to/from stand  Toilet Transfer: Min guard (stand turn from recliner to bed)  Toileting- Water quality scientist and Hygiene: Min guard;Sit to/from stand  General ADL Comments: Pt having issues with BP today, see vitals section   Mobility  Overal bed mobility: Needs Assistance  Bed Mobility: Sit to Supine  Supine to sit: Supervision  Sit to supine: Supervision  General bed mobility comments: Pt required no (A) for bed mobility.   Transfers  Overall transfer level: Needs assistance  Equipment used: Rolling walker (2 wheeled)  Transfers: Sit to/from Stand  Sit to Stand: Min guard  Stand pivot transfers: +2 safety/equipment  General transfer comment: min guard for safety; dues for hand placement and pt needed assist in standing x2 standing trials due to orthostatics   Ambulation / Gait / Stairs / Wheelchair Mobility  Ambulation/Gait  Ambulation/Gait assistance: Min guard;+2 safety/equipment  Ambulation Distance (Feet): 40 Feet  Assistive device: Rolling walker (2 wheeled)  Gait Pattern/deviations: Step-through pattern  Gait velocity: Decreased  Gait velocity interpretation: Below normal speed for age/gender  General Gait Details: Similar events happened as in the last treatment. Pt started out ambulating fine then started staggering and stopping, became pale and diaphoretic needing to sit down. Second trial, pt started to  stagger and pass out immediately, 3rd stand pt able to stand for BP which was likely trending down at 115/46. Sitting BP after gait and incident was 132/68   Posture / Balance  Dynamic Sitting Balance  Sitting balance - Comments: Pt able to scoot EOB without support with VCs.  Special needs/care consideration  BiPAP/CPAP Yes, but it is broken. Needs to be tested here in [place] but has not had time.  CPM No  Continuous Drip IV No  Dialysis No  Life Vest No  Oxygen Yes, on O2 at home and in hospital 3L/min [place]  Special Bed No  Trach Size No  Wound Vac (area) No  Skin Bruises easily  Bowel mgmt: Last BM 07/25/14  Bladder mgmt: Voids up on [facility] with incontinence. Wears pads at home for incontinence.  Diabetic mgmt No   Previous Home Environment  Living Arrangements: Alone  Available Help at Discharge: Family;Friend(s);Available PRN/intermittently  Type of Home: House  Home Layout: One level  Home Access: Stairs to enter  Entrance Stairs-Rails: Can reach both  Entrance Stairs-Number of Steps: 3-4, has 8 at other entrance but doesn't use  Bathroom Shower/Tub: Administrator, Civil Service: Standard  Bathroom Accessibility: Yes  How Accessible: Accessible via walker  [place]: No  Discharge Living Setting  Plans for Discharge Living Setting: Patient's home;Alone;House  Type of Home at Discharge: House  Discharge Home Layout: One level  Discharge Home Access: Stairs to enter  Entrance Stairs-Number of Steps: 3-4 step entry  Does the patient have any problems obtaining your medications?: No  Social/Family/Support Systems  Patient Roles: Parent (Recently widowed 4 months ago. Has a son and daughter.)  Contact Information: Zelina Jimerson - son [phone]  Anticipated Caregiver: self, son, dtr in law and neighbors  Ability/Limitations of Caregiver: Son has his own business, lives 5 mins away, but has MS. Dtr lives in [place].  Caregiver Availability: Intermittent  Discharge Plan  Discussed with Primary Caregiver: Yes  Is Caregiver In Agreement with Plan?: Yes  Does Caregiver/Family have Issues with Lodging/Transportation while Pt is in Rehab?: No  Goals/Additional Needs  Patient/Family Goal for Rehab: PT/OT mod I  Expected length of stay: 7 days  Cultural Considerations: Baptist  Dietary Needs: Soft diet, thin liquids  Equipment Needs: TBD  Additional Information: Husband died about 4 months ago from Mesothelioma. They had been married 38 yrs. Patient is in the grief state at present.  Pt/Family Agrees to Admission and willing to participate: Yes  Program Orientation Provided & Reviewed with Pt/Caregiver Including Roles & Responsibilities: Yes  Decrease burden of Care through IP rehab admission: N/A  Possible need for SNF placement upon discharge: Not planned  Patient Condition: This patient's condition remains as documented in the consult dated 07/25/14, in which the Rehabilitation Physician determined and documented that the patient's condition is appropriate for intensive rehabilitative care in an inpatient rehabilitation facility. Will admit to inpatient rehab today.  Preadmission Screen Completed By: Retta [name], 07/26/2014 11:46 AM  ______________________________________________________________________  Discussed status with Dr. Naaman Plummer on 07/26/14 at 1126 and received telephone approval for admission today.  Admission Coordinator: Retta [name], time1126/Date08/04/15  Cosigned by: Meredith Staggers, MD [07/26/2014 12:53 PM]

## 2014-07-26 NOTE — Progress Notes (Signed)
Report called to Ed in inpatient rehab.  Linda [name]

## 2014-07-26 NOTE — PMR Pre-admission (Signed)
PMR Admission Coordinator Pre-Admission Assessment  Patient: Linda [name] is an 68 y.o., female MRN: [id number] DOB: [birth year] Height: 5\' 1"  (154.9 cm) Weight: 62.1 kg (136 lb 14.5 oz)              Insurance Information HMO:      PPO:       PCP:       IPA:       80/20:       OTHER:   PRIMARY: Medicare A/B      Policy#: [id number]      Subscriber: Vivi Barrack CM Name:        Phone#:       Fax#:   Pre-Cert#:        Employer: Retired Benefits:  Phone #:       Name: Checked in [place]. Date: 02/21/11     Deduct: $1260      Out of Pocket Max: none      Life Max: unlimited CIR: 100%      SNF: 100 days Outpatient: 80%     Co-Pay: 20% Home Health: 100%      Co-Pay: none DME: 80%     Co-Pay: 20% Providers: patient's choice  SECONDARY: BCBS PPO out of state      Policy#: [id number]      Subscriber: Vivi Barrack CM Name:        Phone#:       Fax#:   Pre-Cert#:        Employer:  Retired Benefits:  Phone #: [phone]     Name:   Eff. Date:       Deduct:        Out of Pocket Max:        Life Max:   CIR:        SNF:   Outpatient:       Co-Pay:   Home Health:        Co-Pay:   DME:       Co-Pay:     Emergency Contact Information Contact Information   Name Relation Home Work Mobile   Tama Son   [phone]   Welford Roche Daughter   [phone]     Current Medical History  Patient Admitting Diagnosis:  Gait disorder  History of Present Illness: A 68 y.o. female with history of HTN, asbestosis--oxygen depenedent, chronic pain, anemia, recent UTI who was admitted on 07/17/14 with orthostatic episodes for past 2 weeks with onset of nose bleed X 2 days with multiple falls. She on chronic coumadin, plavix and meloxicam and was found to be anemic with Hgb 5.1 and INR >10.0. CT head with right petrous bone mass and CT abdomen/pelvis without retroperitoneal hematoma. She was transfused with 2 units PRBC and treated with FFP and Vit K. Patient placed on IV PPI and  underwent EGD revealing gastritis as well as biopsy of gastric nodule on 07/28. Hem/Onc consulted for input on need for anticoagulation and felt that coumadin not needed as CT chest earlier this year as well as BLE dopplers negative. Dr. Terrence Dupont consulted for input and recommended low dose ASA once cleared per GI. Palliative care consulted for support due to grief/loss of husband as well as anxiety/agitation/pain issues and provided ego support as well as Resources on support groups. PT ongoing and patient limited by deconditioning as well as orthostatic symptoms. MD, PT recommending CIR for progressive therapies.      Past Medical History  Past Medical History  Diagnosis Date  .  HTN (hypertension)   . Depression   . Recurrent UTI     +hx of hospitalization for pyelonephritis  . Hay fever   . Mixed incontinence urge and stress   . Diverticular disease   . Insomnia   . Fibromyalgia     Patient states dx was around her late 104s but she had sx's for years prior to this.  . Syncope     +Hypotensive; ED visit--Dr. Terrence Dupont did Cath--nonobstructive CAD, EF 55-60%  . Idiopathic angio-edema-urticaria 72014    Angioedema component was very minimal  . 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 Asthma     + asbestososis   . History of pneumonia     hospitalized 12/2011, 02/2013, and 07/2013 [facility]) for this  . High cholesterol     "I'm on Lipitor cause it will help my heart in some way" (07/12/2013)  . Anginal pain     Nonobstructive CAD 2014  . OSA on CPAP   . Iron deficiency anemia     Hematologist in [place], [place] did extensive w/u; no cause found; failed oral supplement;; gets fairly regular (q16m or so) IV iron infusions (Venofer (iron sucrose) 200mg  with procrit.  "for 14 yr I've been getting blood work q month & getting infusions prn" (07/12/2013).  Dr. Marin Olp locally, iron infusions done, then  erythropoietin deficiency diagnosed: aranesp to be started as of 09/2013.  . H/O hiatal hernia   . Migraine syndrome     "not as often anymore; used to be ~ q wk" (07/12/2013)  . Tension headache, chronic   . DDD (degenerative disc disease)     lumbar and cervical.   . Osteoarthritis     "severe; progressing fast" (07/12/2013)  . Chronic lower back pain   . Anxiety     panic attacks  . Nephrolithiasis     "passed all on my own or they are still in there" (07/12/2013)  . Pyelonephritis     "several times over the last 30 yr" (07/12/2013)  . Diastolic congestive heart failure   . COPD (chronic obstructive pulmonary disease)   . Pulmonary embolism 07/2013    coumadin needed x 6 mo; Dx at [facility] with very small peripheral upper lobe pe 07/2013    . Pleural plaque with presence of asbestos 07/22/2013  . BPPV (benign paroxysmal positional vertigo) 12/16/2012  . RBBB (right bundle branch block)     Family History  family history includes Arthritis in her mother; Diabetes in her father; Heart disease in her father; Hypertension in her father; Kidney disease in her mother; Stroke in her father.  Prior Rehab/Hospitalizations:  Had [facility] [place] [facility] 2 yrs ago for about 3 weeks.   Current Medications  Current facility-administered medications:acetaminophen (TYLENOL) suppository 650 mg, 650 mg, Rectal, Q6H PRN, Rise Patience, MD;  acetaminophen (TYLENOL) tablet 650 mg, 650 mg, Oral, Q6H PRN, Rise Patience, MD;  albuterol (PROVENTIL) (2.5 MG/3ML) 0.083% nebulizer solution 2.5 mg, 2.5 mg, Nebulization, Q6H PRN, Rise Patience, MD ALPRAZolam Duanne Moron) tablet 0.5-1 mg, 0.5-1 mg, Oral, TID PRN, Cherene Altes, MD, 1 mg at 07/22/14 1232;  amitriptyline (ELAVIL) tablet 100 mg, 100 mg, Oral, QHS, Cherene Altes, MD, 100 mg at 07/25/14 2237;  atorvastatin (LIPITOR) tablet 20 mg, 20 mg, Oral, q1800, Cherene Altes, MD, 20 mg at 82/80/03 4917;  folic acid (FOLVITE) tablet 2 mg, 2  mg, Oral, Daily,  Volanda Napoleon, MD, 2 mg at 07/26/14 1028 lidocaine (LIDODERM) 5 % 1 patch, 1 patch, Transdermal, H88F, Pershing Proud, NP, 1 patch at 07/26/14 1027;  nebivolol (BYSTOLIC) tablet 5 mg, 5 mg, Oral, Daily, Samella Parr, NP, 5 mg at 07/26/14 1027;  nitroGLYCERIN (NITROSTAT) SL tablet 0.4 mg, 0.4 mg, Sublingual, Q5 Min x 3 PRN, Rise Patience, MD;  ondansetron [facility]) injection 4 mg, 4 mg, Intravenous, Q6H PRN, Rise Patience, MD, 4 mg at 07/25/14 1305 ondansetron (ZOFRAN) tablet 4 mg, 4 mg, Oral, Q6H PRN, Rise Patience, MD;  oxyCODONE-acetaminophen (PERCOCET/ROXICET) 5-325 MG per tablet 2 tablet, 2 tablet, Oral, Q4H PRN, Samella Parr, NP, 2 tablet at 07/26/14 0459;  pantoprazole (PROTONIX) EC tablet 40 mg, 40 mg, Oral, Q0600, Ladene Artist, MD, 40 mg at 07/26/14 0277;  PARoxetine (PAXIL) tablet 20 mg, 20 mg, Oral, Daily, Allie Bossier, MD, 20 mg at 07/26/14 1027 polyethylene glycol (MIRALAX / GLYCOLAX) packet 17 g, 17 g, Oral, Daily, Domenic Polite, MD, 17 g at 07/25/14 0955;  pregabalin (LYRICA) capsule 200 mg, 200 mg, Oral, Daily, Allie Bossier, MD, 200 mg at 07/26/14 1049;  pregabalin (LYRICA) capsule 400 mg, 400 mg, Oral, QHS, Allie Bossier, MD, 400 mg at 07/25/14 2238;  senna-docusate (Senokot-S) tablet 2 tablet, 2 tablet, Oral, BID, Domenic Polite, MD, 2 tablet at 07/26/14 1027 traZODone (DESYREL) tablet 100-200 mg, 100-200 mg, Oral, QHS PRN, Samella Parr, NP, 200 mg at 07/25/14 2357  Patients Current Diet: Criss Rosales  Precautions / Restrictions Precautions Precautions: Fall Restrictions Weight Bearing Restrictions: No   Prior Activity Level Community (5-7x/wk): Goes out about 4 X a week.  Home Assistive Devices / Equipment Home Assistive Devices/Equipment: Oxygen Home Equipment: Cane - single point;Walker - 2 wheels;Bedside commode;Grab bars - toilet;Grab bars - tub/shower;Hand held shower head  Prior Functional Level Prior Function Level of Independence:  Independent Comments:  (Slower but can still get around, use when needed)  Current Functional Level Cognition  Overall Cognitive Status: Within Functional Limits for tasks assessed Orientation Level: Oriented X4    Extremity Assessment (includes Sensation/Coordination)  Upper Extremity Assessment: Defer to OT evaluation  Lower Extremity Assessment: Generalized weakness (Not specifically tested, 3/5 supine against gravity)    ADLs  Overall ADL's : Needs assistance/impaired Eating/Feeding: Independent;Sitting Grooming: Min guard;Standing Upper Body Bathing: Set up;Sitting Lower Body Bathing: Min guard;Sit to/from stand Upper Body Dressing : Set up;Sitting Lower Body Dressing: Min guard;Sit to/from stand Toilet Transfer: Min guard (stand turn from recliner to bed) Toileting- Water quality scientist and Hygiene: Min guard;Sit to/from stand General ADL Comments: Pt having issues with BP today, see vitals section    Mobility  Overal bed mobility: Needs Assistance Bed Mobility: Sit to Supine Supine to sit: Supervision Sit to supine: Supervision General bed mobility comments: Pt required no (A) for bed mobility.     Transfers  Overall transfer level: Needs assistance Equipment used: Rolling walker (2 wheeled) Transfers: Sit to/from Stand Sit to Stand: Min guard Stand pivot transfers: +2 safety/equipment General transfer comment: min guard for safety; dues for hand placement and pt needed assist in standing x2 standing trials due to orthostatics    Ambulation / Gait / Stairs / Wheelchair Mobility  Ambulation/Gait Ambulation/Gait assistance: Min guard;+2 safety/equipment Ambulation Distance (Feet): 40 Feet Assistive device: Rolling walker (2 wheeled) Gait Pattern/deviations: Step-through pattern Gait velocity: Decreased Gait velocity interpretation: Below normal speed for age/gender General Gait Details: Similar events happened as  in the last treatment.  Pt started out ambulating  fine then started staggering and stopping, became pale and diaphoretic needing to sit down.  Second trial, pt started to stagger and pass out immediately,  3rd stand pt able to stand for BP which was likely trending down at 115/46.  Sitting BP after gait and incident was 132/68    Posture / Balance Dynamic Sitting Balance Sitting balance - Comments: Pt able to scoot EOB without support with VCs.    Special needs/care consideration BiPAP/CPAP Yes, but it is broken.  Needs to be tested here in [place] but has not had time. CPM No Continuous Drip IV No Dialysis No        Life Vest No Oxygen Yes, on O2 at home and in hospital 3L/min [place] Special Bed No Trach Size No Wound Vac (area) No     Skin Bruises easily                               Bowel mgmt: Last BM 07/25/14 Bladder mgmt: Voids up on [facility] with incontinence.  Wears pads at home for incontinence. Diabetic mgmt No    Previous Home Environment Living Arrangements: Alone Available Help at Discharge: Family;Friend(s);Available PRN/intermittently Type of Home: House Home Layout: One level Home Access: Stairs to enter Entrance Stairs-Rails: Can reach both Entrance Stairs-Number of Steps: 3-4, has 8 at other entrance but doesn't use Bathroom Shower/Tub: Chiropodist: Standard Bathroom Accessibility: Yes How Accessible: Accessible via walker [place]: No  Discharge Living Setting Plans for Discharge Living Setting: Patient's home;Alone;House Type of Home at Discharge: House Discharge Home Layout: One level Discharge Home Access: Stairs to enter Entrance Stairs-Number of Steps: 3-4 step entry Does the patient have any problems obtaining your medications?: No  Social/Family/Support Systems Patient Roles: Parent (Recently widowed 4 months ago.  Has a son and daughter.) Contact Information: Evania Lyne - son [phone] Anticipated Caregiver: self, son, dtr in law and neighbors Ability/Limitations of  Caregiver: Son has his own business, lives 5 mins away, but has MS.  Dtr lives in [place]. Caregiver Availability: Intermittent Discharge Plan Discussed with Primary Caregiver: Yes Is Caregiver In Agreement with Plan?: Yes Does Caregiver/Family have Issues with Lodging/Transportation while Pt is in Rehab?: No  Goals/Additional Needs Patient/Family Goal for Rehab: PT/OT mod I Expected length of stay: 7 days Cultural Considerations: [employer] Dietary Needs: Soft diet, thin liquids Equipment Needs: TBD Additional Information: Husband died about 4 months ago from Mesothelioma.  They had been married 33 yrs.  Patient is in the grief state at present. Pt/Family Agrees to Admission and willing to participate: Yes Program Orientation Provided & Reviewed with Pt/Caregiver Including Roles  & Responsibilities: Yes  Decrease burden of Care through IP rehab admission: N/A  Possible need for SNF placement upon discharge: Not planned  Patient Condition: This patient's condition remains as documented in the consult dated 07/25/14, in which the Rehabilitation Physician determined and documented that the patient's condition is appropriate for intensive rehabilitative care in an inpatient rehabilitation facility. Will admit to inpatient rehab today.  Preadmission Screen Completed By:  Retta [name], 07/26/2014 11:46 AM ______________________________________________________________________   Discussed status with Dr. Naaman Plummer on 07/26/14 at 1126 and received telephone approval for admission today.  Admission Coordinator:  Retta [name], time1126/Date08/04/15

## 2014-07-26 NOTE — H&P (Signed)
Physical Medicine and Rehabilitation Admission H&P  Chief Complaint   Patient presents with   .  Gait disorder due to multiple medical and psychosocial issues   HPI: Linda [name] is a 68 y.o. female with history of HTN, asbestosis--oxygen depenedent, chronic pain, anemia, recent UTI who was admitted on 07/17/14 with orthostatic episodes for past 2 weeks with onset of nose bleed X 2 days with multiple falls. She on chronic coumadin, plavix and meloxicam and was found to be anemic with Hgb 5.1 and INR >10.0. CT head with right petrous bone mass and CT abdomen/pelvis without retroperitoneal hematoma. She was transfused with 2 units PRBC and treated with FFP and Vit K. Patient placed on IV PPI and underwent EGD revealing gastritis as well as biopsy of gastric nodule on 07/28. Pathology with reactive gastropathy. Hem/Onc consulted for input on need for anticoagulation and felt that coumadin not needed as CT chest earlier this year as well as BLE dopplers negative. Dr. Terrence Dupont consulted for input and recommended low dose ASA once cleared per GI. Palliative care consulted for support due to grief/loss of husband as well as anxiety/agitation/pain issues and provided ego support as well as Resources on support groups. PT ongoing and patient continues to be limited by deconditioning as well as orthostatic symptoms.  Review of Systems  HENT: Positive for tinnitus (past two weeks prior to passing out). Negative for hearing loss.  Respiratory: Positive for shortness of breath.  Cardiovascular: Negative for chest pain and leg swelling.  Gastrointestinal: Positive for constipation. Negative for heartburn, nausea and abdominal pain.  Genitourinary: Negative for urgency and frequency.  Musculoskeletal: Positive for back pain and myalgias.  Neurological: Positive for dizziness (past two weeks) and sensory change (right hand and 4th, 5th fingers due to nerve damage.). Negative for headaches.    Psychiatric/Behavioral: Positive for depression. The patient is nervous/anxious. The patient does not have insomnia (chronic -sleeps 3-4 hours).   Past Medical History   Diagnosis  Date   .  HTN (hypertension)    .  Depression    .  Recurrent UTI      +hx of hospitalization for pyelonephritis   .  Hay fever    .  Mixed incontinence urge and stress    .  Diverticular disease    .  Insomnia    .  Fibromyalgia      Patient states dx was around her late 16s but she had sx's for years prior to this.   .  Syncope      +Hypotensive; ED visit--Dr. Terrence Dupont did Cath--nonobstructive CAD, EF 55-60%   .  Idiopathic angio-edema-urticaria  72014     Angioedema component was very minimal   .  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  Asthma      + asbestososis   .  History of pneumonia      hospitalized 12/2011, 02/2013, and 07/2013 [facility]) for this   .  High cholesterol      "I'm on Lipitor cause it will help my heart in some way" (07/12/2013)   .  Anginal pain      Nonobstructive CAD 2014   .  OSA on CPAP    .  Iron deficiency anemia      [place]ematologist in [place], [place] did [place] w/u; no cause found; failed oral supplement;; gets fairly regular (q9mor so)  IV iron infusions (Venofer  215m with procrit. "for 14 yr I've been getting blood work q month & getting infusions prn" (07/12/2013). Dr. EMarin Olplocally, iron infusions done, then erythropoietin deficiency diagnosed: aranesp to be started as of 09/2013.   .  H/O hiatal hernia    .  Migraine syndrome      "not as often anymore; used to be ~ q wk" (07/12/2013)   .  Tension headache, chronic    .  DDD (degenerative disc disease)      lumbar and cervical.   .  Osteoarthritis      "severe; progressing fast" (07/12/2013)   .  Chronic lower back pain    .  Anxiety      panic attacks   .  Nephrolithiasis      "passed all on my own or they are still in  there" (07/12/2013)   .  Pyelonephritis      "several times over the last 30 yr" (07/12/2013)   .  Diastolic congestive heart failure    .  COPD (chronic obstructive pulmonary disease)    .  Pulmonary embolism  07/2013     [facility] x 6 mo[facility]   .  Pleural plaque with presence of asbestos  07/22/2013   .  BPPV (benign paroxysmal positional vertigo)  12/16/2012   .  RBBB (right bundle branch block)     Past Surgical History   Procedure  Laterality  Date   .  Appendectomy   1960   .  Total abdominal hysterectomy   1974   .  Tendon release   1996     Right forearm and hand   .  Knee surgery   2005   .  Heel spur surgery  Left  2008   .  Plantar fascia release  Left  2008   .  Axillary surgery  Left  1978     Multiple "lump" in armpit per pt   .  Coccyx removal   1972   .  Cardiac catheterization   01/2013     nonobstructive CAD, EF 55-60%   .  Transthoracic echocardiogram   01/2013; 04/2014     2014--NORMAL. 2015--focal basal septal hypertrophy, EF 55-60%, grade I diast dysfxn, mild LAE.   .  Dilation and curettage of uterus   ? 1970's   .  Eye surgery  Left  2012-2013     "injections for ~ 1 yr; don't really know what for" (07/12/2013)   .  Spirometry   04/25/14     In hosp for acute asthma/COPD flare: mixed obstructive and restrictive lung disease. The FEV1 is severely reduced at 45% predicted. FEV1 signif decreased compared to prior spirometry 07/23/13.   .[name] Kitchen Esophagogastroduodenoscopy  N/A  07/19/2014     Procedure: ESOPHAGOGASTRODUODENOSCOPY ; Surgeon: MLadene Artist MD; [facility]; Service: Endoscopy; Laterality: N/A;    Family History   Problem  Relation  Age of Onset   .  Arthritis  Mother    .  Kidney disease  Mother    .  Heart disease  Father    .  Stroke  Father    .  Hypertension  Father    .  Diabetes  Father     Social History: Lives alone. Retired tPharmacist, hospital Independent without AD.... Furniture walks  at home. She reports that she has never smoked. She has never used  smokeless tobacco. She reports that she does not drink alcohol or use illicit drugs   Allergies   Allergen  Reactions   .  Penicillins  Itching, Swelling and Rash     Tolerated Cefepime in ED.    Medications Prior to Admission   Medication  Sig  Dispense  Refill   .  albuterol (PROVENTIL HFA;VENTOLIN HFA) 108 (90 BASE) MCG/ACT inhaler  Inhale 2 puffs into the lungs every 6 (six) hours as needed for wheezing or shortness of breath.     [name] Kitchen  albuterol (PROVENTIL) (2.5 MG/3ML) 0.083% nebulizer solution  Take 3 mLs (2.5 mg total) by nebulization every 6 (six) hours as needed for wheezing or shortness of breath.  75 mL  1   .  ALPRAZolam (XANAX) 1 MG tablet  Take 0.5-1 mg by mouth 3 (three) times daily as needed for anxiety or sleep.     [name] Kitchen  amitriptyline (ELAVIL) 100 MG tablet  Take 100 mg by mouth at bedtime.     [name] Kitchen  atorvastatin (LIPITOR) 20 MG tablet  Take 1 tablet (20 mg total) by mouth daily at 6 PM.  30 tablet  6   .  ciprofloxacin (CIPRO) 500 MG tablet  Take 1 tablet (500 mg total) by mouth 2 (two) times daily.  10 tablet  0   .  clopidogrel (PLAVIX) 75 MG tablet  Take 75 mg by mouth daily.     .  furosemide (LASIX) 20 MG tablet  Take 20 mg by mouth daily.     .  isosorbide mononitrate (IMDUR) 30 MG 24 hr tablet  Take 30 mg by mouth daily.     .  meloxicam (MOBIC) 7.5 MG tablet  Take 7.5 mg by mouth 2 (two) times daily.     .  Milnacipran (SAVELLA) 50 MG TABS tablet  Take 50 mg by mouth 2 (two) times daily.     .  Multiple Vitamins-Minerals (CENTRUM SILVER) tablet  Take 1 tablet by mouth daily.     .  nebivolol (BYSTOLIC) 5 MG tablet  Take 5 mg by mouth daily.     .  nitroGLYCERIN (NITROSTAT) 0.4 MG SL tablet  Place 1 tablet (0.4 mg total) under the tongue every 5 (five) minutes x 3 doses as needed for chest pain.  60 tablet  3   .  phenazopyridine (PYRIDIUM) 200 MG tablet  Take 1 tablet (200 mg total) by mouth 3 (three) times  daily as needed for pain.  6 tablet  0   .  pregabalin (LYRICA) 200 MG capsule  Take 200-400 mg by mouth 3 (three) times daily. Take 1 capsule in the morning and supper and 2 capsule at bedtime     .  traZODone (DESYREL) 50 MG tablet  Take 100-200 mg by mouth at bedtime as needed (for insomnia).     .  warfarin (COUMADIN) 5 MG tablet  Take 10 mg by mouth daily.      Home:  Home Living  Family/patient expects to be discharged to:: Private residence  Living Arrangements: Alone  Available Help at Discharge: Family;Friend(s);Available PRN/intermittently  Type of Home: House  Home Access: Stairs to enter  [employer] of Steps: 3-4, has 8 at other entrance but doesn't use  Entrance Stairs-Rails: Can reach both  Home Layout: One level  Home Equipment: Cane - single point;Walker - 2 wheels;Bedside commode;Grab bars - toilet;Grab bars - tub/shower;Hand held shower head  Functional History:  Prior Function  Level of Independence:  Independent  Comments: (Slower but can still get around, use when needed)  Functional Status:  Mobility:  Bed Mobility  Overal bed mobility: Needs Assistance  Bed Mobility: Sit to Supine  Supine to sit: Supervision  Sit to supine: Supervision  General bed mobility comments: Pt required no (A) for bed mobility.  Transfers  Overall transfer level: Needs assistance  Equipment used: Rolling walker (2 wheeled)  Transfers: Sit to/from Stand  Sit to Stand: Min guard  Stand pivot transfers: +2 safety/equipment  General transfer comment: min guard for safety; dues for hand placement and pt needed assist in standing x2 standing trials due to orthostatics  Ambulation/Gait  Ambulation/Gait assistance: Min guard;+2 safety/equipment  Ambulation Distance (Feet): 40 Feet  Assistive device: Rolling walker (2 wheeled)  Gait Pattern/deviations: Step-through pattern  Gait velocity: Decreased  Gait velocity interpretation: Below normal speed for age/gender  General  Gait Details: Similar events happened as in the last treatment. Pt started out ambulating fine then started staggering and stopping, became pale and diaphoretic needing to sit down. Second trial, pt started to stagger and pass out immediately, 3rd stand pt able to stand for BP which was likely trending down at 115/46. Sitting BP after gait and incident was 132/68   ADL:  ADL  Overall ADL's : Needs assistance/impaired  Eating/Feeding: Independent;Sitting  Grooming: Min guard;Standing  Upper Body Bathing: Set up;Sitting  Lower Body Bathing: Min guard;Sit to/from stand  Upper Body Dressing : Set up;Sitting  Lower Body Dressing: Min guard;Sit to/from stand  Toilet Transfer: Min guard (stand turn from recliner to bed)  Toileting- Water quality scientist and Hygiene: Min guard;Sit to/from stand  General ADL Comments: Pt having issues with BP today, see vitals section  Cognition:  Cognition  Overall Cognitive Status: Within Functional Limits for tasks assessed  Orientation Level: Oriented X4  Cognition  Arousal/Alertness: Awake/alert  Behavior During Therapy: WFL for tasks assessed/performed  Overall Cognitive Status: Within Functional Limits for tasks assessed  Physical Exam:  Blood pressure 118/58, pulse 74, temperature 98 F (36.7 C), temperature source Oral, resp. rate 16, height '5\' 1"'  (1.549 m), weight 62.1 kg (136 lb 14.5 oz), SpO2 98.00%.  Constitutional: She is oriented to person, place, and time. She appears well-developed and well-nourished.  Pale and anxious appearing.  HENT: oral mucosa pink, dentition good Head: Normocephalic and atraumatic.  Eyes: Conjunctivae are normal. Pupils are equal, round, and reactive to light.  Neck: Normal range of motion. Neck supple.  Cardiovascular: Normal rate and regular rhythm. No murmurs Respiratory: Effort normal and breath sounds normal. No respiratory distress. She has no wheezes.  GI: Soft. Bowel sounds are normal. She exhibits no  distension. There is no tenderness.  Neurological: She is alert and oriented to person, place, and time. Mild sensory loss over right foot and ulnar hand is persistent---otherwise normal sensory exam. Strength grossly 4+/5 in bilateral UE's. LE's 4/5 HF and Knees, 4/5 ankles.. Normal rom and fmc. Cognitively displays good insight and awareness.  Skin: Skin is warm and dry.  Psychiatric: Her speech is normal and behavior is normal. Judgment normal. Her mood is slightly anxious. She is not slowed and not withdrawn. Cognition and memory are normal.  Mentioned loss of husband couple of time during conversation.    Results for orders placed during the hospital encounter of 07/17/14 (from the past 48 hour(s))   CBC Status: Abnormal    Collection Time    07/25/14 5:05 AM   Result  Value  Ref Range  WBC  6.1  4.0 - 10.5 K/uL    RBC  3.56 (*)  3.87 - 5.11 MIL/uL    Hemoglobin  10.3 (*)  12.0 - 15.0 g/dL    HCT  32.3 (*)  36.0 - 46.0 %    MCV  90.7  78.0 - 100.0 fL    MCH  28.9  26.0 - 34.0 pg    MCHC  31.9  30.0 - 36.0 g/dL    RDW  15.6 (*)  11.5 - 15.5 %    Platelets  223  150 - 400 K/uL   COMPREHENSIVE METABOLIC PANEL Status: Abnormal    Collection Time    07/25/14 5:05 AM   Result  Value  Ref Range    Sodium  145  137 - 147 mEq/L    Potassium  3.7  3.7 - 5.3 mEq/L    Chloride  104  96 - 112 mEq/L    CO2  30  19 - 32 mEq/L    Glucose, Bld  87  70 - 99 mg/dL    BUN  12  6 - 23 mg/dL    Creatinine, Ser  0.78  0.50 - 1.10 mg/dL    Calcium  9.0  8.4 - 10.5 mg/dL    Total Protein  6.3  6.0 - 8.3 g/dL    Albumin  3.2 (*)  3.5 - 5.2 g/dL    AST  13  0 - 37 U/L    ALT  9  0 - 35 U/L    Alkaline Phosphatase  61  39 - 117 U/L    Total Bilirubin  0.3  0.3 - 1.2 mg/dL    GFR calc non Af Amer  84 (*)  >90 mL/min    GFR calc Af Amer  >90  >90 mL/min    Comment:  (NOTE)     The eGFR has been calculated using the CKD EPI equation.     This calculation has not been validated in all clinical  situations.     eGFR's persistently <90 mL/min signify possible Chronic Kidney     Disease.    Anion gap  11  5 - 15   RETICULOCYTES Status: Abnormal    Collection Time    07/25/14 5:05 AM   Result  Value  Ref Range    Retic Ct Pct  4.3 (*)  0.4 - 3.1 %    RBC.  3.56 (*)  3.87 - 5.11 MIL/uL    Retic Count, Manual  153.1  19.0 - 186.0 K/uL   CBC Status: Abnormal    Collection Time    07/26/14 4:56 AM   Result  Value  Ref Range    WBC  6.2  4.0 - 10.5 K/uL    RBC  3.53 (*)  3.87 - 5.11 MIL/uL    Hemoglobin  10.1 (*)  12.0 - 15.0 g/dL    HCT  32.5 (*)  36.0 - 46.0 %    MCV  92.1  78.0 - 100.0 fL    MCH  28.6  26.0 - 34.0 pg    MCHC  31.1  30.0 - 36.0 g/dL    RDW  15.7 (*)  11.5 - 15.5 %    Platelets  191  150 - 400 K/uL   COMPREHENSIVE METABOLIC PANEL Status: Abnormal    Collection Time    07/26/14 4:56 AM   Result  Value  Ref Range    Sodium  142  137 -  147 mEq/L    Potassium  3.8  3.7 - 5.3 mEq/L    Chloride  103  96 - 112 mEq/L    CO2  28  19 - 32 mEq/L    Glucose, Bld  88  70 - 99 mg/dL    BUN  12  6 - 23 mg/dL    Creatinine, Ser  0.82  0.50 - 1.10 mg/dL    Calcium  8.9  8.4 - 10.5 mg/dL    Total Protein  5.9 (*)  6.0 - 8.3 g/dL    Albumin  3.1 (*)  3.5 - 5.2 g/dL    AST  15  0 - 37 U/L    ALT  11  0 - 35 U/L    Alkaline Phosphatase  59  39 - 117 U/L    Total Bilirubin  0.3  0.3 - 1.2 mg/dL    GFR calc non Af Amer  72 (*)  >90 mL/min    GFR calc Af Amer  83 (*)  >90 mL/min    Comment:  (NOTE)     The eGFR has been calculated using the CKD EPI equation.     This calculation has not been validated in all clinical situations.     eGFR's persistently <90 mL/min signify possible Chronic Kidney     Disease.    Anion gap  11  5 - 15    No results found.    Medical Problem List and Plan:  1. Functional deficits secondary to Gait disorder due to multiple medical and psychosocial issues---most notably her GI blood loss and loss of husband.  2. DVT  Prophylaxis/Anticoagulation: Mechanical: Sequential compression devices, below knee Bilateral lower extremities  3. Back pain/fibromyalgia/Pain Management: Will continue percocet prn. Continue lidocaine patch, Lyrica as well as elavil. Question orthostatic changes med related. Off savella.  4. Depression with anxiety/Grief reaction/Mood: Continue paxil with xanax prn. Team to provide ego support. Will have LCSW follow for support and evaluation. Neuropsych to follow up for support  5. Neuropsych: This patient is capable of making decisions on her own behalf.  6. Skin/Wound Care: Routine pressure relief measures. Maintain adequate nutritional status---will add supplement.  7. Gastritis: Continue PPI. Serial check of H/H. Continue ASA.  8. Acute on chronic anemia: Iron supplemented and to follow up with hem/onc past discharge. Continue folic acid.  9. Dizziness: Check orthostatic blood pressures. Will add TED and binder to help with symptoms. H/o BPPV in the past.  10 Right petrous bone mass: will need follow up MRI for work up.  11. Chronic insomnia: CPAP not functional---will need to follow up with pulmonary to reschedule study. Continue trazodone at bedtime.  12. Non-obst CAD with angina: continue ASA with bystolic daily. Off imdur d/c due to orthostatic changes. Monitor for symptoms with increase in activity.  54. [name] with asthma: Oxygen dependent. Albuterol for rescue prn.   Post Admission Physician Evaluation:  1. Functional deficits secondary to Gait disorder due to multiple medical and psychosocial issues.  2. Patient is admitted to receive collaborative, interdisciplinary care between the physiatrist, rehab nursing staff, and therapy team. 3. Patient's level of medical complexity and substantial therapy needs in context of that medical necessity cannot be provided at a lesser intensity of care such as a SNF. 4. Patient has experienced substantial functional loss from his/her baseline  which was documented above under the "Functional History" and "Functional Status" headings. Judging by the patient's diagnosis, physical exam, and functional history, the patient  has potential for functional progress which will result in measurable gains while on inpatient rehab. These gains will be of substantial and practical use upon discharge in facilitating mobility and self-care at the household level. 5. Physiatrist will provide 24 hour management of medical needs as well as oversight of the therapy plan/treatment and provide guidance as appropriate regarding the interaction of the two. 6. 24 hour rehab nursing will assist with bladder management, bowel management, safety, skin/wound care, disease management, medication administration, pain management and patient education and help integrate therapy concepts, techniques,education, etc. 7. PT will assess and treat for/with: Lower extremity strength, range of motion, stamina, balance, functional mobility, safety, adaptive techniques and equipment, activity tolerance, egosupport, pain control. Goals are: mod I. 8. OT will assess and treat for/with: ADL's, functional mobility, safety, upper extremity strength, adaptive techniques and equipment, activity tolerance, community reintegration, leisure awareness. Goals are: mod I. 9. SLP will assess and treat for/with: n/a. Goals are: n/a. 10. Case Management and Social Worker will assess and treat for psychological issues and discharge planning. 11. Team conference will be held weekly to assess progress toward goals and to determine barriers to discharge. 12. Patient will receive at least 3 hours of therapy per day at least 5 days per week. 13. ELOS: 7 days  14. Prognosis: excellent  Meredith Staggers, MD, [facility]   07/26/2014

## 2014-07-26 NOTE — Progress Notes (Signed)
Linda [name]   DOB:1946-04-16   JK#:[id number]   YKD#:[id number]  Subjective: Stable overnight. Afebrile. Feeling better. Pain well controlled. No bleeding issues. To be transferred to [facility]. Appetite improved. Denies nausea or vomiting. No confusion. Ambulating with assistance.   Scheduled Meds: . amitriptyline  100 mg Oral QHS  . atorvastatin  20 mg Oral q1800  . folic acid  2 mg Oral Daily  . lidocaine  1 patch Transdermal Q24H  . nebivolol  5 mg Oral Daily  . pantoprazole  40 mg Oral Q0600  . PARoxetine  20 mg Oral Daily  . polyethylene glycol  17 g Oral Daily  . pregabalin  200 mg Oral Daily  . pregabalin  400 mg Oral QHS  . senna-docusate  2 tablet Oral BID   Continuous Infusions:  PRN Meds:.acetaminophen, acetaminophen, albuterol, ALPRAZolam, nitroGLYCERIN, ondansetron (ZOFRAN) IV, ondansetron, oxyCODONE-acetaminophen, traZODone   Objective:  Filed Vitals:   07/26/14 0553  BP: 118/58  Pulse: 74  Temp: 98 F (36.7 C)  Resp: 16      Intake/Output Summary (Last 24 hours) at 07/26/14 1139 Last data filed at 07/26/14 0700  Gross per 24 hour  Intake   1365 ml  Output      0 ml  Net   1365 ml    ECOG PERFORMANCE STATUS: 2   Symptomatic, >50% in bed during the day (Ambulatory and capable of all self care but unable to carry out any work activities. Up and about more than 50% of waking hours)    68 year old white female, chronically ill appearing, in no acute distress, alert and oriented to time, place and date.  General well-developed and well-nourished  HEENT: Normocephalic, atraumatic.Sclera anicteric.Oral cavity without thrush or lesions.  Neck: Supple. No thyromegaly,no cervical or supraclavicular adenopathy  Lungs: Clear to auscultation. No wheezing, rhonchi or rales.  Cardiac: Regular rate and LZJQBH,4/1 systolic ejection murmur with occasional ectopic beat., no rubs or gallops  Abdomen: Soft nontender,bowel sounds x4. Nohepatosplenomegaly   Extremities: No clubbing cyanosis or edema. No petechial rash  Neuro: No focal or motor deficits     CBG (last 3)  No results found for this basename: GLUCAP,  in the last 72 hours   Labs:   Recent Labs Lab 07/22/14 0047 07/23/14 0346 07/24/14 0300 07/25/14 0505 07/26/14 0456  WBC 10.2 7.0 6.7 6.1 6.2  HGB 9.8* 9.6* 10.7* 10.3* 10.1*  HCT 29.1* 29.1* 33.2* 32.3* 32.5*  PLT 198 189 203 223 191  MCV 89.0 90.4 92.5 90.7 92.1  MCH 30.0 29.8 29.8 28.9 28.6  MCHC 33.7 33.0 32.2 31.9 31.1  RDW 15.8* 16.1* 16.0* 15.6* 15.7*     Chemistries:    Recent Labs Lab 07/20/14 0444  07/22/14 0047 07/23/14 0346 07/24/14 0300 07/25/14 0505 07/26/14 0456  NA 142  < > 142 140 141 145 142  K 3.8  < > 4.0 3.3* 4.1 3.7 3.8  CL 104  < > 107 104 102 104 103  CO2 24  < > 24 27 24 30 28   GLUCOSE 93  < > 132* 103* 90 87 88  BUN 12  < > 12 11 12 12 12   CREATININE 1.00  < > 0.73 0.71 0.75 0.78 0.82  CALCIUM 8.6  < > 9.1 8.7 8.9 9.0 8.9  MG 1.8  --   --   --   --   --   --   AST  --   --   --  13 19  13 15  ALT  --   --   --  10 10 9 11   ALKPHOS  --   --   --  58 60 61 59  BILITOT  --   --   --  0.3 0.2* 0.3 0.3  < > = values in this interval not displayed.  GFR Estimated Creatinine Clearance: 55.5 ml/min (by C-G formula based on Cr of 0.82).  Liver Function Tests:  Recent Labs Lab 07/23/14 0346 07/24/14 0300 07/25/14 0505 07/26/14 0456  AST 13 19 13 15   ALT 10 10 9 11   ALKPHOS 58 60 61 59  BILITOT 0.3 0.2* 0.3 0.3  PROT 5.7* 6.0 6.3 5.9*  ALBUMIN 3.0* 2.9* 3.2* 3.1*     Imaging Studies:  No results found.  Assessment/Plan: 68 y.o.  History of pulmonary embolism  -Anticoagulation on hold due to bleeding issues  She was on coumadin daily.  supratherapeutic INR was found, corrected with  FFP and Vit K x2 . Platelets are normal.  ASA was resumed on 8/3. Plavix discontinue per Cardiology.   No IVC filter is indicated at this time. DVT prophylaxis with SCDs  Anemia  of Iron deficiency and B12 deficiency,associated with acute blood loss +- malignancy  Baseline hemoglobin reported at 10 or higher, goal reached. Stable  S/P transfusion of 3 units since admission. SHe also received IV iron, B12 and currently taking Folic Acid EGD with gastritis and antral mass; pathology showed reactive gastropathy no malignancy/dysplasia Will follow up labs. Her Hb is expected to improve 2 weeks after administration of IV iron.  As mentioned in prior note, will have to be very careful with her Aranesp due to its pro thrombotic risk .Unfortunately due to low erythropoietin level that it might be hard for her to have any hematopoiesis without Aranesp administration.  No further bleeding issues. Avoid NSAIDs  Petrous bone suspicious lesion  stable since 2013  MRI to be performed when clinically stable to better define any progress of abnormal finding.   UTI  S/p IV Cipro . Resolved  Full Code  Disposition  To Inpatient Rehab when medically stable   Other medical issues as per admitting team     **Disclaimer: This note was dictated with voice recognition software. Similar sounding words can inadvertently be transcribed and this note may contain transcription errors which may not have been corrected upon publication of note.** Carmela Piechowski E, PA-C 07/26/2014  11:39 AM

## 2014-07-26 NOTE — Discharge Summary (Signed)
Physician Discharge Summary  Linda [name] ERX:[id number] DOB: 11-Aug-1946 DOA: 07/17/2014  PCP: Tammi Sou, MD  Admit date: 07/17/2014 Discharge date: 07/26/2014  Time spent:40 minutes  Recommendations for Outpatient Follow-up:  1. PCP in 1week 2. CBC in 2 weeks 3. MRI brain in 1-59months to FU nonspecific bony change noted on CT  Discharge Diagnoses:    Acute GI bleeding   Anemia associated with acute blood loss   Syncope   HTN (hypertension), benign   Fibromyalgia syndrome   Polypharmacy   Iron deficiency anemia, unspecified   Pleural plaque with presence of asbestos   OSA (obstructive sleep apnea)   Chronic diastolic CHF, NYHA class 1   History of pulmonary embolism   GI bleed   Chronic respiratory failure on 3L home O2   Discharge Condition: stable  Diet recommendation: low sodium  Filed Weights   07/24/14 0630 07/25/14 0535 07/26/14 0553  Weight: 80.875 kg (178 lb 4.8 oz) 82.2 kg (181 lb 3.5 oz) 62.1 kg (136 lb 14.5 oz)    History of present illness:   Chief Complaint: Falls and loss of consciousness.  HPI: Linda [name] is a 68 y.o. female with history of pulmonary embolism last year, hypertension, nonobstructive CAD, fibromyalgia, bronchial asthma, asbestosis, chronic resp failure on 3L home O2  was brought to the ER after patient had at least 3 episodes of syncope over the last 24 hours. In the ER patient was found to be orthostatic. Stool for occult blood was positive and patient's blood work showed hemoglobin of around 5. Patient takes Coumadin Plavix and meloxicam    Hospital Course:  Upper GI bleeding due to gastritis  -resolved  -EGD with gastritis and antral mass; path reactive gastropathy no malignancy/dysplasia  -no further NSAIDs (was on Mobic preadmit), Coumadin and plavix  stopped  -dark stool 8/1 was likely retained stool from last week, no BM in 4-5days prior.  Anemia associated with acute blood loss  -hb 5 on admission, baseline  around 10  -Has had chronic anemia for many years and was actually worked up by hematologist in [place]  -s/p 3 units PRBC since admission  -hb stable, improving   Antral mass  -path with reactive inflammation only   Supratherapeutic INR  -INR greater than 10 at presentation, s/p FFPx2   CAD  -has non obstructive  -per dr.Harwani, resume ASA if possible, ok to stop Plavix   History of pulmonary embolism  -Anticoagulation/warfarin stopped  -PE in 8/14  -Followed by Dr.Ennever this hospitalization  -no indication for IVC filter   HTN (hypertension), benign  -improving  -orthostatic this am, stopped imdur since this could be contributory to orthostatic hypotension yesterday -given gentle IVF for 12hours then stopped  Chronic resp failure/asbestosis/asthma  -on 3L home O2  -Followed by pulmonologist as an outpatient   Chronic diastolic CHF, NYHA class 1  -Compensated, resumed lasix   Syncope  -Seems directly related to symptomatic anemia   Nonspecific changes in the petrous bone  -radiologist notes this is probably due to volume averaging, and is stable since 2013 but nonetheless suggests MRI for f/u  -further workup as outpt   Fibromyalgia syndrome/Polypharmacy/Grief reaction  -Patient normally takes Percocet at home per her report-ordered here  -per Ennever's office notes pt has been struggling with depression since her husband died earlier this year and endorsing fatigue and increased reports of pain  -chaplain support appreciated   Depression  -Secondary to the recent death of husband, as well as  her multiple medical problems.  -continue Paxil 20 mg daily   OSA  -continue nocturnal CPAP       Discharge Exam: Filed Vitals:   07/26/14 0553  BP: 118/58  Pulse: 74  Temp: 98 F (36.7 C)  Resp: 16    General: AAOx3 Cardiovascular: S1S2/RRR Respiratory: CTAB  Discharge Instructions You were cared for by a hospitalist during your hospital stay. If  you have any questions about your discharge medications or the care you received while you were in the hospital after you are discharged, you can call the unit and asked to speak with the hospitalist on call if the hospitalist that took care of you is not available. Once you are discharged, your primary care physician will handle any further medical issues. Please note that NO REFILLS for any discharge medications will be authorized once you are discharged, as it is imperative that you return to your primary care physician (or establish a relationship with a primary care physician if you do not have one) for your aftercare needs so that they can reassess your need for medications and monitor your lab values.     Medication List    STOP taking these medications       clopidogrel 75 MG tablet  Commonly known as:  PLAVIX     isosorbide mononitrate 30 MG 24 hr tablet  Commonly known as:  IMDUR     meloxicam 7.5 MG tablet  Commonly known as:  MOBIC     phenazopyridine 200 MG tablet  Commonly known as:  PYRIDIUM     SAVELLA 50 MG Tabs tablet  Generic drug:  Milnacipran     warfarin 5 MG tablet  Commonly known as:  COUMADIN      TAKE these medications       albuterol 108 (90 BASE) MCG/ACT inhaler  Commonly known as:  PROVENTIL HFA;VENTOLIN HFA  Inhale 2 puffs into the lungs every 6 (six) hours as needed for wheezing or shortness of breath.     albuterol (2.5 MG/3ML) 0.083% nebulizer solution  Commonly known as:  PROVENTIL  Take 3 mLs (2.5 mg total) by nebulization every 6 (six) hours as needed for wheezing or shortness of breath.     ALPRAZolam 1 MG tablet  Commonly known as:  XANAX  Take 0.5-1 mg by mouth 3 (three) times daily as needed for anxiety or sleep.     amitriptyline 100 MG tablet  Commonly known as:  ELAVIL  Take 100 mg by mouth at bedtime.     aspirin EC 81 MG tablet  Take 1 tablet (81 mg total) by mouth daily.     atorvastatin 20 MG tablet  Commonly known as:   LIPITOR  Take 1 tablet (20 mg total) by mouth daily at 6 PM.     CENTRUM SILVER tablet  Take 1 tablet by mouth daily.     ciprofloxacin 500 MG tablet  Commonly known as:  CIPRO  Take 1 tablet (500 mg total) by mouth 2 (two) times daily.     furosemide 20 MG tablet  Commonly known as:  LASIX  Take 20 mg by mouth daily.     nebivolol 5 MG tablet  Commonly known as:  BYSTOLIC  Take 5 mg by mouth daily.     nitroGLYCERIN 0.4 MG SL tablet  Commonly known as:  NITROSTAT  Place 1 tablet (0.4 mg total) under the tongue every 5 (five) minutes x 3 doses as needed for chest pain.  PARoxetine 20 MG tablet  Commonly known as:  PAXIL  Take 1 tablet (20 mg total) by mouth daily.     polyethylene glycol packet  Commonly known as:  MIRALAX / GLYCOLAX  Take 17 g by mouth daily.     pregabalin 200 MG capsule  Commonly known as:  LYRICA  Take 200-400 mg by mouth 3 (three) times daily. Take 1 capsule in the morning and supper and 2 capsule at bedtime     senna-docusate 8.6-50 MG per tablet  Commonly known as:  Senokot-S  Take 2 tablets by mouth at bedtime as needed for mild constipation.     traZODone 50 MG tablet  Commonly known as:  DESYREL  Take 100-200 mg by mouth at bedtime as needed (for insomnia).       Allergies  Allergen Reactions  . Penicillins Itching, Swelling and Rash    Tolerated Cefepime in ED.      The results of significant diagnostics from this hospitalization (including imaging, microbiology, ancillary and laboratory) are listed below for reference.    Significant Diagnostic Studies: Ct Abdomen Pelvis Wo Contrast  07/18/2014   CLINICAL DATA:  Syncope, nausea. Check for retroperitoneal hematoma.  EXAM: CT ABDOMEN AND PELVIS WITHOUT CONTRAST  TECHNIQUE: Multidetector CT imaging of the abdomen and pelvis was performed following the standard protocol without IV contrast.  COMPARISON:  CT of the chest Apr 25, 2014  FINDINGS: LUNG BASES: Calcified pleural plaques and  pleural thickening. The heart appears mildly enlarged, pericardium is nonsuspicious.  KIDNEYS/BLADDER: Kidneys are orthotopic, demonstrating normal size and morphology. Punctate right upper pole nephrolithiasis. No hydronephrosis; limited assessment for renal masses on this nonenhanced examination. The unopacified ureters are normal in course and caliber. Urinary bladder is partially distended and unremarkable.  SOLID ORGANS: The liver, spleen, gallbladder, pancreas and adrenal glands are unremarkable for this non-contrast examination.  GASTROINTESTINAL TRACT: The stomach, small and large bowel are normal in course and caliber without inflammatory changes, the sensitivity may be decreased by lack of enteric contrast. The appendix is not discretely identified, however there are no inflammatory changes in the right lower quadrant.  PERITONEUM/RETROPERITONEUM: No intraperitoneal free fluid nor free air. Aortoiliac vessels are normal in course and caliber, lower calcific atherosclerosis. No lymphadenopathy by CT size criteria. Status post hysterectomy. Phleboliths in the pelvis.  SOFT TISSUES/ OSSEOUS STRUCTURES: Grade 1 L5-S1 anterolisthesis without pars interarticularis defect, mild L5-S1 neural foraminal narrowing. Bilateral gluteal injection granulomas.  IMPRESSION: No acute intra-abdominal or pelvic process; specifically no retroperitoneal hematoma.  Calcified pleural plaques and pleural thickening, please see CT of chest Apr 25, 2014 for further description.   Electronically Signed   By: Elon Alas   On: 07/18/2014 02:35   Dg Chest 2 View  07/17/2014   CLINICAL DATA:  Loss of consciousness.  EXAM: CHEST  2 VIEW  COMPARISON:  04/27/2014  FINDINGS: Heart size and pulmonary vascularity are normal. There are extensive pleural calcifications bilaterally, stable. No infiltrates or effusions. No acute osseous abnormality.  IMPRESSION: No acute abnormalities.   Electronically Signed   By: Rozetta Nunnery M.D.   On:  07/17/2014 19:12   Dg Lumbar Spine 2-3 Views  07/18/2014   CLINICAL DATA:  Low back pain.  EXAM: LUMBAR SPINE - 2-3 VIEW  COMPARISON:  Bone window images from CT abdomen and pelvis earlier same date. Lumbar spine x-rays 12/08/2013.  FINDINGS: Five non rib-bearing lumbar vertebrae. Grade 1 spondylolisthesis of L5 on S1 approximating 8 mm, unchanged from the prior  examination, due to severe facet degenerative changes at this level. Mild disc space narrowing at L3-4 and L4-5, unchanged. Remaining disc spaces well preserved. Facet degenerative changes at L3-4 and L4-5, unchanged. Visualized sacroiliac joints intact.  IMPRESSION: 1. No acute osseous abnormality. 2. Degenerative grade 1 spondylolisthesis of L5 on S1 approximating 8 mm, stable since December, 2014. 3. Stable mild degenerative disc disease at L3-4 and L4-5. 4. Stable facet degenerative changes at L3-4 and L4-5.   Electronically Signed   By: Evangeline Dakin M.D.   On: 07/18/2014 07:58   Dg Pelvis 1-2 Views  07/18/2014   CLINICAL DATA:  Low back and pelvis pain.  EXAM: PELVIS - 1-2 VIEW  COMPARISON:  Lumbar spine series of today's date.  FINDINGS: The bony pelvis is adequately mineralized. There is no acute fracture. The observed portions of the sacrum are unremarkable. There are degenerative facet joint changes at L5-S1. There is mild narrowing of the hip joints greater on the right than on the left.  IMPRESSION: There is no acute bony abnormality of the pelvis.   Electronically Signed   By: David  Martinique   On: 07/18/2014 07:54   Ct Head Wo Contrast  07/17/2014   CLINICAL DATA:  Syncopal episodes with loss of consciousness, dizziness, history of migraines  EXAM: CT HEAD WITHOUT CONTRAST  TECHNIQUE: Contiguous axial images were obtained from the base of the skull through the vertex without intravenous contrast.  COMPARISON:  01/30/2013  FINDINGS: Calvarium is intact. No significant inflammatory change in the visualized portions of the paranasal  sinuses.  Mild age-related atrophy. No evidence of hemorrhage or extra-axial fluid. No evidence of infarct or hydrocephalus.  There is a suggestion of an extra-axial mass near the right petrous bone measuring about 13 mm. This appears contiguous however within overlying gyrus and an identical appearance is identified on 12/14/2012.  IMPRESSION: Suggestion of mass arising off the right petrous bone is likely due to volume averaging with a prominent overlying gyrus of right cerebellum, as the appearance is very similar to 12/14/2012. MRI should be considered to confirm this however.   Electronically Signed   By: Skipper Cliche M.D.   On: 07/17/2014 19:55    Microbiology: No results found for this or any previous visit (from the past 240 hour(s)).   Labs: Basic Metabolic Panel:  Recent Labs Lab 07/20/14 0444  07/22/14 0047 07/23/14 0346 07/24/14 0300 07/25/14 0505 07/26/14 0456  NA 142  < > 142 140 141 145 142  K 3.8  < > 4.0 3.3* 4.1 3.7 3.8  CL 104  < > 107 104 102 104 103  CO2 24  < > 24 27 24 30 28   GLUCOSE 93  < > 132* 103* 90 87 88  BUN 12  < > 12 11 12 12 12   CREATININE 1.00  < > 0.73 0.71 0.75 0.78 0.82  CALCIUM 8.6  < > 9.1 8.7 8.9 9.0 8.9  MG 1.8  --   --   --   --   --   --   < > = values in this interval not displayed. Liver Function Tests:  Recent Labs Lab 07/23/14 0346 07/24/14 0300 07/25/14 0505 07/26/14 0456  AST 13 19 13 15   ALT 10 10 9 11   ALKPHOS 58 60 61 59  BILITOT 0.3 0.2* 0.3 0.3  PROT 5.7* 6.0 6.3 5.9*  ALBUMIN 3.0* 2.9* 3.2* 3.1*   No results found for this basename: LIPASE, AMYLASE,  in the last  168 hours No results found for this basename: AMMONIA,  in the last 168 hours CBC:  Recent Labs Lab 07/22/14 0047 07/23/14 0346 07/24/14 0300 07/25/14 0505 07/26/14 0456  WBC 10.2 7.0 6.7 6.1 6.2  HGB 9.8* 9.6* 10.7* 10.3* 10.1*  HCT 29.1* 29.1* 33.2* 32.3* 32.5*  MCV 89.0 90.4 92.5 90.7 92.1  PLT 198 189 203 223 191   Cardiac Enzymes: No  results found for this basename: CKTOTAL, CKMB, CKMBINDEX, TROPONINI,  in the last 168 hours BNP: BNP (last 3 results)  Recent Labs  09/13/13 1520 04/23/14 1800 04/26/14 0708  PROBNP 262.4* 801.7* 2423.0*   CBG: No results found for this basename: GLUCAP,  in the last 168 hours     Signed:  Agnieszka Newhouse  [facility] 07/26/2014, 12:18 PM

## 2014-07-27 ENCOUNTER — Inpatient Hospital Stay (HOSPITAL_COMMUNITY): Payer: Medicare Other | Admitting: Physical Therapy

## 2014-07-27 ENCOUNTER — Inpatient Hospital Stay (HOSPITAL_COMMUNITY): Payer: Medicare Other | Admitting: Occupational Therapy

## 2014-07-27 DIAGNOSIS — R5381 Other malaise: Secondary | ICD-10-CM

## 2014-07-27 DIAGNOSIS — Z5189 Encounter for other specified aftercare: Secondary | ICD-10-CM

## 2014-07-27 DIAGNOSIS — D62 Acute posthemorrhagic anemia: Secondary | ICD-10-CM

## 2014-07-27 DIAGNOSIS — IMO0001 Reserved for inherently not codable concepts without codable children: Secondary | ICD-10-CM

## 2014-07-27 DIAGNOSIS — K922 Gastrointestinal hemorrhage, unspecified: Secondary | ICD-10-CM

## 2014-07-27 DIAGNOSIS — F341 Dysthymic disorder: Secondary | ICD-10-CM

## 2014-07-27 LAB — COMPREHENSIVE METABOLIC PANEL
ALT: 16 U/L (ref 0–35)
AST: 20 U/L (ref 0–37)
Albumin: 3.4 g/dL — ABNORMAL LOW (ref 3.5–5.2)
Alkaline Phosphatase: 68 U/L (ref 39–117)
Anion gap: 16 — ABNORMAL HIGH (ref 5–15)
BILIRUBIN TOTAL: 0.3 mg/dL (ref 0.3–1.2)
BUN: 12 mg/dL (ref 6–23)
CALCIUM: 9 mg/dL (ref 8.4–10.5)
CO2: 27 meq/L (ref 19–32)
Chloride: 103 mEq/L (ref 96–112)
Creatinine, Ser: 0.78 mg/dL (ref 0.50–1.10)
GFR calc Af Amer: >90 mL/min (ref >90)
GFR, EST NON AFRICAN AMERICAN: 84 mL/min — AB (ref >90)
GLUCOSE: 83 mg/dL (ref 70–99)
Potassium: 4 mEq/L (ref 3.7–5.3)
SODIUM: 146 meq/L (ref 137–147)
Total Protein: 6.6 g/dL (ref 6.0–8.3)

## 2014-07-27 LAB — URINE MICROSCOPIC-ADD ON

## 2014-07-27 LAB — CBC WITH DIFFERENTIAL/PLATELET
Basophils Absolute: 0.1 10*3/uL (ref 0.0–0.1)
Basophils Relative: 1 % (ref 0–1)
EOS PCT: 3 % (ref 0–5)
Eosinophils Absolute: 0.2 10*3/uL (ref 0.0–0.7)
HEMATOCRIT: 36 % (ref 36.0–46.0)
Hemoglobin: 11.4 g/dL — ABNORMAL LOW (ref 12.0–15.0)
LYMPHS ABS: 1.3 10*3/uL (ref 0.7–4.0)
Lymphocytes Relative: 21 % (ref 12–46)
MCH: 29.5 pg (ref 26.0–34.0)
MCHC: 31.7 g/dL (ref 30.0–36.0)
MCV: 93.3 fL (ref 78.0–100.0)
Monocytes Absolute: 0.6 10*3/uL (ref 0.1–1.0)
Monocytes Relative: 9 % (ref 3–12)
NEUTROS ABS: 4.1 10*3/uL (ref 1.7–7.7)
Neutrophils Relative %: 66 % (ref 43–77)
Platelets: ADEQUATE 10*3/uL (ref 150–400)
RBC: 3.86 MIL/uL — AB (ref 3.87–5.11)
RDW: 15.7 % — ABNORMAL HIGH (ref 11.5–15.5)
WBC: 6.3 10*3/uL (ref 4.0–10.5)

## 2014-07-27 LAB — URINALYSIS, ROUTINE W REFLEX MICROSCOPIC
Bilirubin Urine: NEGATIVE
Glucose, UA: NEGATIVE mg/dL
Hgb urine dipstick: NEGATIVE
KETONES UR: NEGATIVE mg/dL
NITRITE: NEGATIVE
Protein, ur: NEGATIVE mg/dL
Specific Gravity, Urine: 1.012 (ref 1.005–1.030)
UROBILINOGEN UA: 0.2 mg/dL (ref 0.0–1.0)
pH: 6.5 (ref 5.0–8.0)

## 2014-07-27 MED ORDER — TRAZODONE HCL 50 MG PO TABS
100.0000 mg | ORAL_TABLET | Freq: Every day | ORAL | Status: DC
  Administered 2014-07-27: 200 mg via ORAL
  Administered 2014-07-28 – 2014-07-29 (×2): 100 mg via ORAL
  Administered 2014-07-30 – 2014-08-03 (×5): 200 mg via ORAL
  Filled 2014-07-27 (×2): qty 4
  Filled 2014-07-27: qty 2
  Filled 2014-07-27: qty 4
  Filled 2014-07-27 (×2): qty 2
  Filled 2014-07-27: qty 4
  Filled 2014-07-27: qty 2
  Filled 2014-07-27: qty 4

## 2014-07-27 NOTE — Evaluation (Signed)
Physical Therapy Assessment and Plan  Patient Details  Name: Linda [name] MRN: [id number] Date of Birth: [birth year]  PT Diagnosis: Abnormal posture, Difficulty walking, Dizziness and giddiness, Impaired sensation, Low back pain and Muscle weakness Rehab Potential: Good ELOS: 7-10 days   Today's Date: 07/27/2014 Time: 0930-1030 Tx 2: 1300-1400 Time Calculation (min): 60 min Tx 2: 60 min  Problem List:  Patient Active Problem List   Diagnosis Date Noted  . Multifactorial gait disorder 07/26/2014  . Chronic respiratory failure 07/23/2014  . Epistaxis 07/18/2014  . Acute GI bleeding 07/17/2014  . Anemia associated with acute blood loss 07/17/2014  . Syncope 07/17/2014  . History of pulmonary embolism 07/17/2014  . GI bleed 07/17/2014  . Arthritis 05/11/2014  . DDD (degenerative disc disease) 05/11/2014  . Fibrositis 05/11/2014  . Amianthosis 05/11/2014  . Grief 04/28/2014  . OSA (obstructive sleep apnea) 04/24/2014  . Chronic diastolic CHF, NYHA class 1 04/24/2014  . Acute pulmonary embolism 08/13/2013  . Pleural plaque with presence of asbestos 07/22/2013  . Iron deficiency anemia, unspecified 06/28/2013  . Polypharmacy 04/26/2013  . Fibromyalgia syndrome 03/01/2013  . Insomnia 11/12/2012  . HTN (hypertension), benign 10/25/2012     Assessment & Plan Clinical Impression: Linda [name] is a 68 y.o. female with history of HTN, asbestosis--oxygen depenedent, chronic pain, anemia, recent UTI who was admitted on 07/17/14 with orthostatic episodes for past 2 weeks with onset of nose bleed X 2 days with multiple falls. She on chronic coumadin, plavix and meloxicam and was found to be anemic with Hgb 5.1 and INR >10.0. CT head with right petrous bone mass and CT abdomen/pelvis without retroperitoneal hematoma. She was transfused with 2 units PRBC and treated with FFP and Vit K. Patient placed on IV PPI and underwent EGD revealing gastritis as well as biopsy of gastric nodule  on 07/28. Pathology with reactive gastropathy. Hem/Onc consulted for input on need for anticoagulation and felt that coumadin not needed as CT chest earlier this year as well as BLE dopplers negative. Dr. Terrence Dupont consulted for input and recommended low dose ASA once cleared per GI. Palliative care consulted for support due to grief/loss of husband as well as anxiety/agitation/pain issues and provided ego support as well as Resources on support groups. PT ongoing and patient continues to be limited by deconditioning as well as orthostatic symptoms.   Patient transferred to CIR on 07/26/2014 .   Patient currently requires min with mobility secondary to decreased cardiorespiratoy endurance and decreased oxygen support, decreased coordination and decreased standing balance and decreased postural control.  Prior to hospitalization, patient was independent  with mobility and lived with Alone in a House home.  Home access is [place] door =8-9 stairss, Garage entrance=3-4 stairs, Porch entrance=3-4 stairs (patient states she normally uses garage entrance)Stairs to enter.  Patient will benefit from skilled PT intervention to maximize safe functional mobility, minimize fall risk and decrease caregiver burden for planned discharge home with intermittent assist.  Anticipate patient will benefit from follow up [facility] at discharge.  PT - End of Session Endurance Deficit: Yes Endurance Deficit Description: low BP, low 02 sats at times, and patient "aprehensive" about first day on CIR  Skilled Therapeutic Intervention Session 1: PT Evaluation: PT completes MMT, sensation test, balance test, functional mobility assessment - see below for details.   Gait Training: PT instructs pt in ambulation with rolling oxygen tank canister x 40' req min A for safety - wide BOS, flat foot contact, pt becomes dizzy and  must sit down. PT instructs pt in ambulation with RW x 15' req min A and pt is assisted to sit in w/c due to immediate  onset of feeling like she will black out.   Therapeutic Activity: Pt demonstrates independence in rolling and sit to supine transfer in bed without rail. Pt demonstrates mod I supine to sit due to increased time, CGA sit to stand without AD, CGA stand-step transfer without AD bed to w/c due to balance impairment.   Session 2: Gait Training: PT instructs pt in ascending/descending 1 shallow step (5") x 4 reps with B rails req min A for safety - pt req a seated rest break due to ears beginning to feel closed, which is an early sign of pt feeling as if she will pass out. PT instructs pt in ascending/descending 1 standard height step (7") x 7 req with B rails req min A for safety - pt begins to feel disoriented and is assisted back into w/c for safety. Pt requires approximately a 5 minute rest break to recover when she feels like she is about to pass out.   W/C Management: PT instructs pt in self propelling manual w/c with B UEs x 150' req SBA and verbal cues for turning techniques. PT introduces pt to legrest and brakes management and pt continues to require assist.   Neuromuscular Reeducation: PT administers the [facility] Test and pt scores 23/56, indicating high falls risk.   Pt presents with impairments in balance, endurance, and safety with functional mobility. Pt will benefit from continued PT to make her safe mod I from a w/c level, with the goal of returning to ambulation mod I. Pt will benefit from close vitals monitoring, generalized activity tolerance, standing balance reeducation, and safety with gait on level surfaces and stair.     PT Evaluation Precautions/Restrictions Precautions Precautions: Fall Precaution Comments: patient with low 02 sats, low BP readings; patient has tendency to "pass out" per her report Restrictions Weight Bearing Restrictions: No General Chart Reviewed: Yes Family/Caregiver Present: No Vital SignsTherapy Vitals Pulse Rate: 71 BP: 141/80  mmHg Patient Position (if appropriate): Sitting Oxygen Therapy SpO2: 95 % O2 Device: Nasal cannula O2 Flow Rate (L/min): 3 L/min Pulse Oximetry Type: Intermittent Pain Pain Assessment Pain Assessment: 0-10 Pain Score: 7  Pain Type: Chronic pain Pain Location: Back Pain Orientation: Lower Pain Descriptors / Indicators: Cramping Pain Onset: On-going Patients Stated Pain Goal: 0 Pain Intervention(s): Distraction;Rest Multiple Pain Sites: No Session 2: Pt c/o 6/10 pain in low back radiating down R leg - rest breaks given. Pt refuses to call RN for pain meds, as she doesn't want to seem overly eager.  Home Living/Prior Functioning Home Living Available Help at Discharge: Family;Friend(s);Available PRN/intermittently Type of Home: House Home Access: Stairs to enter [employer] of Steps: Front door =8-9 stairss, Garage entrance=3-4 stairs, Porch entrance=3-4 stairs (patient states she normally uses garage entrance) Entrance Stairs-Rails: Can reach both Home Layout: One level  Lives With: Alone Prior Function Level of Independence: Independent with basic ADLs;Independent with transfers;Independent with gait  Able to Take Stairs?: Yes Driving: Yes     Cognition Overall Cognitive Status: Within Functional Limits for tasks assessed Arousal/Alertness: Awake/alert Orientation Level: Oriented X4 Attention: Focused;Sustained Focused Attention: Appears intact Sustained Attention: Appears intact Memory: Appears intact Awareness: Appears intact Problem Solving: Appears intact Safety/Judgment: Appears intact Sensation Sensation Light Touch: Impaired Detail Light Touch Impaired Details: Impaired RUE;Impaired RLE;Impaired LLE (4th and 5th R fingers diminished; intermittent tingling in B  toes/soles of feet) Stereognosis: Not tested Hot/Cold: Not tested Proprioception: Appears Intact (B ankles tested) Additional Comments: BUEs appear intact; patient reports some  numbness/tingling in digits 4&5 in right hand (patient reports torn tendon & pinced nerve in elbow) Coordination Gross Motor Movements are Fluid and Coordinated: Yes Fine Motor Movements are Fluid and Coordinated: Not tested Finger Nose Finger Test: wfl; slowed at following commands with L UE vs R UE Heel Shin Test: intact Motor  Motor Motor: Within Functional Limits Motor - Skilled Clinical Observations: smooth, coordinated movements  Mobility Bed Mobility Bed Mobility: Rolling Right;Rolling Left;Left Sidelying to Sit;Sitting - Scoot to [employer] of Bed;Sit to Supine Rolling Right: 7: Independent Rolling Left: 7: Independent Left Sidelying to Sit: 6: Modified independent (Device/Increase time) (increased time) Sitting - Scoot to Edge of Bed: 7: Independent Sit to Supine: 7: Independent Transfers Transfers: Yes Sit to Stand: 4: Min guard;From bed;With armrests (pt c/o dizziness upon sitting) Stand Pivot Transfers: 4: Min guard;With armrests Locomotion  Ambulation Ambulation: Yes Ambulation/Gait Assistance: 4: Min assist Ambulation Distance (Feet): 40 Feet Assistive device: Rolling walker Ambulation/Gait Assistance Details: Verbal cues for gait pattern;Verbal cues for precautions/safety Gait Gait: Yes Gait Pattern: Impaired Gait Pattern: Wide base of support;Decreased hip/knee flexion - left;Decreased hip/knee flexion - right;Decreased dorsiflexion - right;Decreased dorsiflexion - left Stairs / Additional Locomotion Stairs: Yes Stairs Assistance: 4: Min assist Stairs Assistance Details: Verbal cues for safe use of DME/AE Stair Management Technique: Two rails Number of Stairs: 4 Height of Stairs: 5 Wheelchair Mobility Wheelchair Mobility: Yes Wheelchair Assistance: 5: Investment banker, operational Details: Verbal cues for Marketing executive: Both upper extremities Wheelchair Parts Management: Needs assistance Distance: 150  Trunk/Postural Assessment  Cervical  Assessment Cervical Assessment: Within Functional Limits Thoracic Assessment Thoracic Assessment: Within Functional Limits Lumbar Assessment Lumbar Assessment: Within Functional Limits Postural Control Postural Control: Deficits on evaluation Postural Limitations: slight forward head  Balance Balance Balance Assessed: Yes Static Sitting Balance Static Sitting - Balance Support: Feet supported Static Sitting - Level of Assistance: 7: Independent Dynamic Sitting Balance Dynamic Sitting - Balance Support: Feet supported Dynamic Sitting - Level of Assistance: 5: Stand by assistance Static Standing Balance Static Standing - Balance Support: During functional activity;No upper extremity supported Static Standing - Level of Assistance: 4: Min assist Dynamic Standing Balance Dynamic Standing - Balance Support: During functional activity;No upper extremity supported Dynamic Standing - Level of Assistance: 4: Min assist Dynamic Standing - Balance Activities: Reaching for objects Extremity Assessment  RUE Assessment RUE Assessment: Within Functional Limits (grossly 4/5) LUE Assessment LUE Assessment: Within Functional Limits (grossly 4/5) RLE Assessment RLE Assessment: Within Functional Limits (grossly 4/5) LLE Assessment LLE Assessment: Within Functional Limits (grossly 4/5)  FIM:  FIM - Bed/Chair Transfer Bed/Chair Transfer Assistive Devices: Copy: 4: Supine > Sit: Min A (steadying Pt. > 75%/lift 1 leg);4: Bed > Chair or W/C: Min A (steadying Pt. > 75%)   Refer to Care Plan for Long Term Goals  Recommendations for other services: None  Discharge Criteria: Patient will be discharged from PT if patient refuses treatment 3 consecutive times without medical reason, if treatment goals not met, if there is a change in medical status, if patient makes no progress towards goals or if patient is discharged from hospital.  The above assessment, treatment plan,  treatment alternatives and goals were discussed and mutually agreed upon: by patient  [facility] M 07/27/2014, 10:17 AM

## 2014-07-27 NOTE — Evaluation (Signed)
Occupational Therapy Assessment and Plan & Session Note  Patient Details  Name: Linda [name] MRN: [id number] Date of Birth: [birth year]  OT Diagnosis: abnormal posture, lumbago (low back pain) and muscle weakness (generalized) Rehab Potential: Rehab Potential: Good ELOS: 7-10 days   Today's Date: 07/27/2014 Time: 0730-0832 Time Calculation (min): 62 min  Problem List:  Patient Active Problem List   Diagnosis Date Noted  . Multifactorial gait disorder 07/26/2014  . Chronic respiratory failure 07/23/2014  . Epistaxis 07/18/2014  . Acute GI bleeding 07/17/2014  . Anemia associated with acute blood loss 07/17/2014  . Syncope 07/17/2014  . History of pulmonary embolism 07/17/2014  . GI bleed 07/17/2014  . Arthritis 05/11/2014  . DDD (degenerative disc disease) 05/11/2014  . Fibrositis 05/11/2014  . Amianthosis 05/11/2014  . Grief 04/28/2014  . OSA (obstructive sleep apnea) 04/24/2014  . Chronic diastolic CHF, NYHA class 1 04/24/2014  . Acute pulmonary embolism 08/13/2013  . Pleural plaque with presence of asbestos 07/22/2013  . Iron deficiency anemia, unspecified 06/28/2013  . Polypharmacy 04/26/2013  . Fibromyalgia syndrome 03/01/2013  . Insomnia 11/12/2012  . HTN (hypertension), benign 10/25/2012    Past Medical History: See PMR for more information   Past Surgical History:  Past Surgical History  Procedure Laterality Date  . Appendectomy  1960  . Total abdominal hysterectomy  1974  . Tendon release  1996    Right forearm and hand  . Knee surgery  2005  . Heel spur surgery Left 2008  . Plantar fascia release Left 2008  . Axillary surgery Left 1978    Multiple "lump" in armpit per pt  . Coccyx removal  1972  . Cardiac catheterization  01/2013    nonobstructive CAD, EF 55-60%  . Transthoracic echocardiogram  01/2013; 04/2014    2014--NORMAL.  2015--focal basal septal hypertrophy, EF 55-60%, grade I diast dysfxn, mild LAE.    . Dilation and curettage of uterus  ?  1970's  . Eye surgery Left 2012-2013    "injections for ~ 1 yr; don't really know what for" (07/12/2013)  . Spirometry  04/25/14    In hosp for acute asthma/COPD flare: mixed obstructive and restrictive lung disease. The FEV1 is severely reduced at 45% predicted.  FEV1 signif decreased compared to prior spirometry 07/23/13.  [name] Kitchen Esophagogastroduodenoscopy N/A 07/19/2014    Procedure: ESOPHAGOGASTRODUODENOSCOPY ;  Surgeon: Ladene Artist, MD;  Location: [facility] ENDOSCOPY;  Service: Endoscopy;  Laterality: N/A;    Assessment & Plan Clinical Impression: Linda [name] is a 68 y.o. female with history of HTN, asbestosis--oxygen depenedent, chronic pain, anemia, recent UTI who was admitted on 07/17/14 with orthostatic episodes for past 2 weeks with onset of nose bleed X 2 days with multiple falls. She on chronic coumadin, plavix and meloxicam and was found to be anemic with Hgb 5.1 and INR >10.0. CT head with right petrous bone mass and CT abdomen/pelvis without retroperitoneal hematoma. She was transfused with 2 units PRBC and treated with FFP and Vit K. Patient placed on IV PPI and underwent EGD revealing gastritis as well as biopsy of gastric nodule on 07/28. Pathology with reactive gastropathy. Hem/Onc consulted for input on need for anticoagulation and felt that coumadin not needed as CT chest earlier this year as well as BLE dopplers negative. Dr. Terrence Dupont consulted for input and recommended low dose ASA once cleared per GI. Palliative care consulted for support due to grief/loss of husband as well as anxiety/agitation/pain issues and provided ego support as well as  Resources on support groups. PT ongoing and patient continues to be limited by deconditioning as well as orthostatic symptoms. Patient transferred to CIR on 07/26/2014.    Patient currently requires min with basic self-care skills and IADL secondary to muscle weakness and muscle joint tightness, decreased cardiorespiratoy endurance and decreased oxygen  support and decreased sitting balance, decreased standing balance, decreased postural control and decreased balance strategies. Patient with orthostatic episodes over the past couple weeks. Prior to hospitalization, patient could complete ADLs & IADLs independently.   Patient will benefit from skilled intervention to increase independence with basic self-care skills prior to discharge home independently.  Anticipate patient will require intermittent supervision and follow up home health.  OT - End of Session Activity Tolerance: Tolerates 10 - 20 min activity with multiple rests Endurance Deficit: Yes Endurance Deficit Description: low BP, low 02 sats at times, and patient "aprehensive" about first day on CIR OT Assessment Rehab Potential: Good Barriers to Discharge: Decreased caregiver support OT Patient demonstrates impairments in the following area(s): Balance;Endurance;Motor;Pain;Perception;Safety;Sensory OT Basic ADL's Functional Problem(s): Grooming;Bathing;Dressing;Toileting OT Advanced ADL's Functional Problem(s): Simple Meal Preparation OT Transfers Functional Problem(s): Toilet;Tub/Shower OT Additional Impairment(s): None OT Plan OT Intensity: Minimum of 1-2 x/day, 45 to 90 minutes OT Frequency: 5 out of 7 days OT Duration/Estimated Length of Stay: 7-10 days OT Treatment/Interventions: Balance/vestibular training;Community reintegration;Discharge planning;DME/adaptive equipment instruction;Functional mobility training;Pain management;Patient/family education;Psychosocial support;Splinting/orthotics;Skin care/wound managment;Self Care/advanced ADL retraining;Therapeutic Activities;Therapeutic Exercise;UE/LE Strength taining/ROM;UE/LE Coordination activities OT Self Feeding Anticipated Outcome(s): independent OT Basic Self-Care Anticipated Outcome(s): setting goals for mod I OT Toileting Anticipated Outcome(s): setting goals for mod I OT Bathroom Transfers Anticipated Outcome(s):  settin goals for mod I OT Recommendation Patient destination: Home Follow Up Recommendations: Home health OT Equipment Recommended: 3 in 1 bedside comode;Tub/shower bench   Skilled Therapeutic Intervention Initial 1:1 occupational therapy evaluation completed. Patient with 5/10 complaints of pain in lower back secondary to DDD (chronic pain), RN aware of this. Focused skilled intervention on bed mobility, BP regulation, donning of TEDs & education on TEDs, education on abdominal binder(therapist talked with RN about ordering binder per order), UB/LB dressing, sit<>stands, EOB>stand>ambulation to recliner, grooming tasks seated at sink in recliner, and overall activity tolerance/endurance. Patient with complaints of dizziness during session, below are BP readings with thigh-high TEDs donned: Lying: 138/74 Sitting EOB: 132/75 with symptoms after bending down for LB dressing Standing: 119/66 with minimal symptoms  At end of session, left patient seated in recliner with BLEs elevated and all needs within reach. Therapist talked with patient about importance of calling for assistance when needing to get up.   OT Evaluation Precautions/Restrictions  Precautions Precautions: Fall Precaution Comments: patient with low 02 sats, low BP readings and has tendency to pass out Restrictions Weight Bearing Restrictions: No  General Chart Reviewed: Yes Family/Caregiver Present: No  Vital Signs Therapy Vitals Temp: 97.8 F (36.6 C) Temp src: Oral Pulse Rate: 74 Resp: 18 BP: 145/65 mmHg Patient Position (if appropriate): Standing Oxygen Therapy SpO2: 95 % O2 Device: None (Room air)  Pain Pain Assessment Pain Assessment: 0-10 Pain Score: 5  Pain Type: Chronic pain Pain Location: Back (secondary to DDD) Pain Orientation: Lower Pain Onset: On-going Pain Intervention(s): RN made aware Multiple Pain Sites: No  Home Living/Prior Functioning Home Living Available Help at Discharge:  Family;Friend(s);Available PRN/intermittently ("I don't know how much and it depends on their schedules") Type of Home: House Home Access: Stairs to enter [employer] of Steps: Front door =8-9 stairss, Garage entrance=3-4 stairs, Porch entrance=3-4  stairs (patient states she normally uses garage entrance) Entrance Stairs-Rails: Can reach both (Garage entrance) Home Layout: One level  Lives With: Alone IADL History Homemaking Responsibilities: Yes Meal Prep Responsibility: Primary Laundry Responsibility: Primary Cleaning Responsibility: Primary Bill Paying/Finance Responsibility: Primary Shopping Responsibility: Primary Child Care Responsibility: No Homemaking Comments: Patient reports she has someone to help clean Current License: Yes Occupation: Retired Type of Occupation: Retired Industrial/product designer Leisure and Hobbies: play golf, paint, read,  Prior Function Level of Independence: Independent with basic ADLs;Independent with transfers;Independent with gait  Able to Take Stairs?: Yes Driving: Yes  ADL - See FIM   Vision/Perception  Vision- History Baseline Vision/History: Wears glasses Wears Glasses: At all times Patient Visual Report: No change from baseline Vision- Assessment Vision Assessment?: No apparent visual deficits   Cognition Overall Cognitive Status: Within Functional Limits for tasks assessed Orientation Level: Oriented X4 Memory: Appears intact Awareness: Appears intact Problem Solving: Appears intact Safety/Judgment: Appears intact  Sensation Sensation Additional Comments: BUEs appear intact; patient reports some numbness/tingling in digits 4&5 in right hand (patient reports torn tendon & pinced nerve in elbow) Coordination Gross Motor Movements are Fluid and Coordinated: Yes Fine Motor Movements are Fluid and Coordinated: Yes  Motor  Motor Motor: Within Functional Limits Motor - Skilled Clinical Observations: smooth, coordinated  movements  Mobility  Bed Mobility Bed Mobility: Rolling Right;Rolling Left;Left Sidelying to Sit;Sitting - Scoot to [employer] of Bed;Sit to Supine Rolling Right: 7: Independent Rolling Left: 7: Independent Left Sidelying to Sit: 6: Modified independent (Device/Increase time) (increased time) Sitting - Scoot to Edge of Bed: 7: Independent Sit to Supine: 7: Independent Transfers Sit to Stand: 4: Min guard;From bed;With armrests (pt c/o dizziness upon sitting)   Trunk/Postural Assessment  Cervical Assessment Cervical Assessment: Within Functional Limits Thoracic Assessment Thoracic Assessment: Within Functional Limits Lumbar Assessment Lumbar Assessment: Within Functional Limits Postural Control Postural Control: Deficits on evaluation Postural Limitations: slight forward head   Balance Balance Balance Assessed: Yes Static Sitting Balance Static Sitting - Balance Support: Feet supported Static Sitting - Level of Assistance: 7: Independent Dynamic Sitting Balance Dynamic Sitting - Balance Support: Feet supported Dynamic Sitting - Level of Assistance: 5: Stand by assistance Static Standing Balance Static Standing - Balance Support: During functional activity;No upper extremity supported Static Standing - Level of Assistance: 4: Min assist Dynamic Standing Balance Dynamic Standing - Balance Support: During functional activity;No upper extremity supported Dynamic Standing - Level of Assistance: 4: Min assist Dynamic Standing - Balance Activities: Reaching for objects  Extremity/Trunk Assessment RUE Assessment RUE Assessment: Within Functional Limits (patient can benefit from BUE functional strengthening ) LUE Assessment LUE Assessment: Within Functional Limits (patient can benefit from BUE functional strengthening )  FIM:  FIM - Eating Eating Activity: 0: Activity did not occur FIM - Grooming Grooming Steps: Wash, rinse, dry face;Wash, rinse, dry hands;Oral care, brush teeth,  clean dentures;Brush, comb hair Grooming: 5: Set-up assist to obtain items FIM - Bathing Bathing: 0: Activity did not occur FIM - Upper Body Dressing/Undressing Upper body dressing/undressing steps patient completed: Thread/unthread right bra strap;Thread/unthread left bra strap;Hook/unhook bra;Thread/unthread right sleeve of pullover shirt/dresss;Thread/unthread left sleeve of pullover shirt/dress;Pull shirt over trunk Upper body dressing/undressing: 5: Set-up assist to: Obtain clothing/put away FIM - Lower Body Dressing/Undressing Lower body dressing/undressing steps patient completed: Thread/unthread right pants leg;Thread/unthread left pants leg;Pull pants up/down;Don/Doff right sock;Don/Doff left sock Lower body dressing/undressing: 4: Steadying Assist (secondary to low BP symptoms ) FIM - Toileting Toileting: 0: Activity did not occur  FIM - Control and instrumentation engineer Devices: Copy: 4: Supine > Sit: Min A (steadying Pt. > 75%/lift 1 leg);4: Bed > Chair or W/C: Min A (steadying Pt. > 75%) FIM - Air cabin crew Transfers: 0-Activity did not occur FIM - Camera operator Transfers: 0-Activity did not occur or was simulated   Refer to Care Plan for Long Term Goals  Recommendations for other services: Neuropsych, patient with increased apprehension and anxiety during initial evaluation. Patient seems to be in grieving of husband recently passing away.   Discharge Criteria: Patient will be discharged from OT if patient refuses treatment 3 consecutive times without medical reason, if treatment goals not met, if there is a change in medical status, if patient makes no progress towards goals or if patient is discharged from hospital.  The above assessment, treatment plan, treatment alternatives and goals were discussed and mutually agreed upon: by patient  Yandiel Bergum 07/27/2014, 1:07 PM

## 2014-07-27 NOTE — Progress Notes (Signed)
[place] PHYSICAL MEDICINE & REHABILITATION     PROGRESS NOTE    Subjective/Complaints: Only slept 4 hours last night. Didn't get full home trazodone dose  Objective: Vital Signs: Blood pressure 145/65, pulse 74, temperature 97.8 F (36.6 C), temperature source Oral, resp. rate 18, weight 77.6 kg (171 lb 1.2 oz), SpO2 95.00%. No results found.  Recent Labs  07/26/14 0456 07/27/14 0540  WBC 6.2 6.3  HGB 10.1* 11.4*  HCT 32.5* 36.0  PLT 191 PLATELET CLUMPS NOTED ON SMEAR, COUNT APPEARS ADEQUATE    Recent Labs  07/26/14 0456 07/27/14 0540  NA 142 146  K 3.8 4.0  CL 103 103  GLUCOSE 88 83  BUN 12 12  CREATININE 0.82 0.78  CALCIUM 8.9 9.0   CBG (last 3)  No results found for this basename: GLUCAP,  in the last 72 hours  Wt Readings from Last 3 Encounters:  07/27/14 77.6 kg (171 lb 1.2 oz)  07/26/14 62.1 kg (136 lb 14.5 oz)  07/26/14 62.1 kg (136 lb 14.5 oz)    Physical Exam:  Constitutional: She is oriented to person, place, and time. She appears well-developed and well-nourished.  No distress HENT: oral mucosa pink, dentition good  Head: Normocephalic and atraumatic.  Eyes: Conjunctivae are normal. Pupils are equal, round, and reactive to light.  Neck: Normal range of motion. Neck supple.  Cardiovascular: Normal rate and regular rhythm. No murmurs  Respiratory: Effort normal and breath sounds normal. No respiratory distress. She has no wheezes.  GI: Soft. Bowel sounds are normal. She exhibits no distension. There is no tenderness.  Neurological: She is alert and oriented to person, place, and time. Mild sensory loss over right foot and ulnar hand is persistent---otherwise normal sensory exam. Strength grossly 4+/5 in bilateral UE's. LE's 4/5 HF and Knees, 4/5 ankles.. Normal rom and fmc. Cognitively displays good insight and awareness.  Skin: Skin is warm and dry.  Psychiatric: Her speech is normal and behavior is normal. Judgment normal. Her mood is slightly  anxious. She is not slowed and not withdrawn. Cognition and memory are normal.  Mentioned loss of husband couple of time during conversation   Assessment/Plan: 1. Functional deficits secondary to gait disorder/deconditioning which require 3+ hours per day of interdisciplinary therapy in a comprehensive inpatient rehab setting. Physiatrist is providing close team supervision and 24 hour management of active medical problems listed below. Physiatrist and rehab team continue to assess barriers to discharge/monitor patient progress toward functional and medical goals. FIM: FIM - Bathing Bathing: 0: Activity did not occur  FIM - Upper Body Dressing/Undressing Upper body dressing/undressing steps patient completed: Thread/unthread right bra strap;Thread/unthread left bra strap;Hook/unhook bra;Thread/unthread right sleeve of pullover shirt/dresss;Thread/unthread left sleeve of pullover shirt/dress;Pull shirt over trunk Upper body dressing/undressing: 5: Set-up assist to: Obtain clothing/put away FIM - Lower Body Dressing/Undressing Lower body dressing/undressing steps patient completed: Thread/unthread right pants leg;Thread/unthread left pants leg;Pull pants up/down;Don/Doff right sock;Don/Doff left sock Lower body dressing/undressing: 4: Steadying Assist (secondary to low BP symptoms )  FIM - Toileting Toileting: 0: Activity did not occur  FIM - Air cabin crew Transfers: 0-Activity did not occur  FIM - Control and instrumentation engineer Devices: Copy: 4: Supine > Sit: Min A (steadying Pt. > 75%/lift 1 leg);4: Bed > Chair or W/C: Min A (steadying Pt. > 75%)     Comprehension Comprehension Mode: Auditory Comprehension: 6-Follows complex conversation/direction: With extra time/assistive device  Expression Expression Mode: Verbal Expression: 6-Expresses complex ideas: With extra time/assistive  device  Social Interaction Social Interaction:  6-Interacts appropriately with others with medication or extra time (anti-anxiety, antidepressant).  Problem Solving Problem Solving: 5-Solves complex 90% of the time/cues < 10% of the time  Memory Memory: 6-More than reasonable amt of time  Medical Problem List and Plan:  1. Functional deficits secondary to Gait disorder due to multiple medical and psychosocial issues---most notably her GI blood loss and loss of husband.  2. DVT Prophylaxis/Anticoagulation: SCD's, ambulation 3. Back pain/fibromyalgia/Pain Management: Will continue percocet prn. Continue lidocaine patch, Lyrica as well as elavil. Question orthostatic changes med related. Off savella.  4. Depression with anxiety/Grief reaction/Mood: Continue paxil with xanax prn. Team to provide ego support. Will have LCSW follow for support and evaluation. Neuropsych to follow up for support  5. Neuropsych: This patient is capable of making decisions on her own behalf.  6. Skin/Wound Care: Routine pressure relief measures. Added nutritional supplement.  7. Gastritis: Continue PPI. Serial check of H/H. Continue ASA.  8. Acute on chronic anemia: Iron supplemented and to follow up with hem/onc past discharge. Continue folic acid.  9. Dizziness: Check orthostatic blood pressures.   TED and binder to help with symptoms. H/o BPPV in the past.  10 Right petrous bone mass: will need follow up MRI for work up.  11. Chronic insomnia: CPAP not functional---will need to follow up with pulmonary to reschedule study. Continue trazodone at bedtime. --increase to 100-200mg  qhs per home routine 12. Non-obst CAD with angina: continue ASA with bystolic daily. Off imdur d/c due to orthostatic changes. Monitor for symptoms with increase in activity.  39. [name] with asthma: Oxygen dependent. Albuterol  prn.   LOS (Days) 1 A FACE TO FACE EVALUATION WAS PERFORMED  Aradhana Gin T 07/27/2014 8:40 AM

## 2014-07-27 NOTE — Progress Notes (Signed)
Orthopedic Tech Progress Note Patient Details:  Linda [name] 10-Jun-1946 [id number]  Ortho Devices Type of Ortho Device: Abdominal binder Ortho Device/Splint Interventions: Application   Cammer, Theodoro Parma 07/27/2014, 11:11 AM

## 2014-07-27 NOTE — Progress Notes (Signed)
INITIAL NUTRITION ASSESSMENT  DOCUMENTATION CODES Per approved criteria  -Obesity Unspecified   INTERVENTION: Provide Magic cup TID between meals, each supplement provides 290 kcal and 9 grams of protein.  NUTRITION DIAGNOSIS: Increased nutrient needs related to chronic illness as evidenced by estimated nutrient needs.   Goal: Pt to meet >/= 90% of their estimated nutrition needs.  Monitor:  PO intake, weight trends, labs, I/O's  Reason for Assessment: Linda [name]  68 y.o. female  Admitting Dx: Multifactorial Gait Disorder  ASSESSMENT: Pt with PMH of hypertension, asthma, osteoarthritis, distolic congestive heart failure, and COPD. Patient with gastritis and presents with gait disorder due to multiple medical and psychosocial issues.  Spoke with pt. Pt reports her appetite is good with meal completion of 100%. Pt reports being nauseous at time, but it does not interfere a lot with her appetite. Pt denies any stomach pains or difficulties eating or swallowing. Pt reports she would like a oral supplement to help her consume extra calories and protein as she reports she is needing the energy for the therapy she is receiving. Pt is willing to try magic cup. Will order.   Nutrition focused physical exam was performed. No signs of significant fat and/or muscle mass depletion.  Height: Ht Readings from Last 1 Encounters:  07/18/14 5\' 1"  (1.549 m)    Weight: Wt Readings from Last 1 Encounters:  07/27/14 171 lb 1.2 oz (77.6 kg)    Ideal Body Weight: 105 lbs  % Ideal Body Weight: 163%  Wt Readings from Last 10 Encounters:  07/27/14 171 lb 1.2 oz (77.6 kg)  07/26/14 136 lb 14.5 oz (62.1 kg)  07/26/14 136 lb 14.5 oz (62.1 kg)  07/08/14 173 lb (78.472 kg)  06/10/14 177 lb (80.287 kg)  05/26/14 177 lb (80.287 kg)  05/11/14 173 lb (78.472 kg)  04/23/14 165 lb (74.844 kg)  04/22/14 168 lb (76.204 kg)  03/31/14 168 lb (76.204 kg)    Usual Body Weight: 170  % Usual Body Weight:  101%  BMI:  Body mass index is 32.34 kg/(m^2). Class I obesity  Estimated Nutritional Needs: Kcal: 1800-2000 Protein: 75-85 grams Fluid: 1.8 L- 2L/ day  Skin: no issues noted  [name]  EDUCATION NEEDS: -No education needs identified at this time   Intake/Output Summary (Last 24 hours) at 07/27/14 1514 Last data filed at 07/27/14 0700  Gross per 24 hour  Intake    240 ml  Output      0 ml  Net    240 ml    Last BM: 8/5   Labs:   Recent Labs Lab 07/25/14 0505 07/26/14 0456 07/27/14 0540  NA 145 142 146  K 3.7 3.8 4.0  CL 104 103 103  CO2 30 28 27   BUN 12 12 12   CREATININE 0.78 0.82 0.78  CALCIUM 9.0 8.9 9.0  GLUCOSE 87 88 83    CBG (last 3)  No results found for this basename: GLUCAP,  in the last 72 hours  Scheduled Meds: . amitriptyline  100 mg Oral QHS  . atorvastatin  20 mg Oral q1800  . folic acid  2 mg Oral Daily  . lidocaine  1 patch Transdermal Q24H  . nebivolol  5 mg Oral Daily  . pantoprazole  40 mg Oral Q0600  . PARoxetine  20 mg Oral Daily  . polyethylene glycol  17 g Oral BID  . pregabalin  200 mg Oral Daily  . pregabalin  400 mg Oral QHS  . senna-docusate  2 tablet Oral BID  . traZODone  100-200 mg Oral QHS    Continuous Infusions:     Past Surgical History  Procedure Laterality Date  . Appendectomy  1960  . Total abdominal hysterectomy  1974  . Tendon release  1996    Right forearm and hand  . Knee surgery  2005  . Heel spur surgery Left 2008  . Plantar fascia release Left 2008  . Axillary surgery Left 1978    Multiple "lump" in armpit per pt  . Coccyx removal  1972  . Cardiac catheterization  01/2013    nonobstructive CAD, EF 55-60%  . Transthoracic echocardiogram  01/2013; 04/2014    2014--NORMAL.  2015--focal basal septal hypertrophy, EF 55-60%, grade I diast dysfxn, mild LAE.    . Dilation and curettage of uterus  ? 1970's  . Eye surgery Left 2012-2013    "injections for ~ 1 yr; don't really know what for"  (07/12/2013)  . Spirometry  04/25/14    In hosp for acute asthma/COPD flare: mixed obstructive and restrictive lung disease. The FEV1 is severely reduced at 45% predicted.  FEV1 signif decreased compared to prior spirometry 07/23/13.  [name] Kitchen Esophagogastroduodenoscopy N/A 07/19/2014    Procedure: ESOPHAGOGASTRODUODENOSCOPY ;  Surgeon: Ladene Artist, MD;  Location: [facility] ENDOSCOPY;  Service: Endoscopy;  Laterality: N/A;    Linda Locks, MS, Provisional LDN Pager # [phone] After hours/ weekend pager # [phone]

## 2014-07-27 NOTE — Progress Notes (Signed)
Patient information reviewed and entered into eRehab system by Sonda Coppens, RN, CRRN, PPS Coordinator.  Information including medical coding and functional independence measure will be reviewed and updated through discharge.     Per nursing patient was given "Data Collection Information Summary for Patients in Inpatient Rehabilitation Facilities with attached "Privacy Act Statement-Health Care Records" upon admission.  

## 2014-07-28 ENCOUNTER — Encounter (HOSPITAL_COMMUNITY): Payer: Medicare Other

## 2014-07-28 ENCOUNTER — Inpatient Hospital Stay (HOSPITAL_COMMUNITY): Payer: Medicare Other

## 2014-07-28 ENCOUNTER — Encounter (HOSPITAL_COMMUNITY): Payer: Medicare Other | Admitting: Occupational Therapy

## 2014-07-28 DIAGNOSIS — R269 Unspecified abnormalities of gait and mobility: Secondary | ICD-10-CM

## 2014-07-28 DIAGNOSIS — Z515 Encounter for palliative care: Secondary | ICD-10-CM

## 2014-07-28 DIAGNOSIS — F4321 Adjustment disorder with depressed mood: Secondary | ICD-10-CM

## 2014-07-28 LAB — URINE CULTURE: Colony Count: >=100000

## 2014-07-28 NOTE — Progress Notes (Signed)
Social Work Assessment and Plan  Patient Details  Name: Linda [name] MRN: [id number] Date of Birth: Nov 28, 1946  Today's Date: 07/28/2014  Problem List:  Patient Active Problem List   Diagnosis Date Noted  . Multifactorial gait disorder 07/26/2014  . Chronic respiratory failure 07/23/2014  . Epistaxis 07/18/2014  . Acute GI bleeding 07/17/2014  . Anemia associated with acute blood loss 07/17/2014  . Syncope 07/17/2014  . History of pulmonary embolism 07/17/2014  . GI bleed 07/17/2014  . Arthritis 05/11/2014  . DDD (degenerative disc disease) 05/11/2014  . Fibrositis 05/11/2014  . Amianthosis 05/11/2014  . Grief 04/28/2014  . OSA (obstructive sleep apnea) 04/24/2014  . Chronic diastolic CHF, NYHA class 1 04/24/2014  . Acute pulmonary embolism 08/13/2013  . Pleural plaque with presence of asbestos 07/22/2013  . Iron deficiency anemia, unspecified 06/28/2013  . Polypharmacy 04/26/2013  . Fibromyalgia syndrome 03/01/2013  . Insomnia 11/12/2012  . HTN (hypertension), benign 10/25/2012     Past Surgical History:  Past Surgical History  Procedure Laterality Date  . Appendectomy  1960  . Total abdominal hysterectomy  1974  . Tendon release  1996    Right forearm and hand  . Knee surgery  2005  . Heel spur surgery Left 2008  . Plantar fascia release Left 2008  . Axillary surgery Left 1978    Multiple "lump" in armpit per pt  . Coccyx removal  1972  . Cardiac catheterization  01/2013    nonobstructive CAD, EF 55-60%  . Transthoracic echocardiogram  01/2013; 04/2014    2014--NORMAL.  2015--focal basal septal hypertrophy, EF 55-60%, grade I diast dysfxn, mild LAE.    . Dilation and curettage of uterus  ? 1970's  . Eye surgery Left 2012-2013    "injections for ~ 1 yr; don't really know what for" (07/12/2013)  . Spirometry  04/25/14    In hosp for acute asthma/COPD flare: mixed obstructive and restrictive lung disease. The FEV1 is severely reduced at 45% predicted.  FEV1  signif decreased compared to prior spirometry 07/23/13.  [name] Kitchen Esophagogastroduodenoscopy N/A 07/19/2014    Procedure: ESOPHAGOGASTRODUODENOSCOPY ;  Surgeon: Ladene Artist, MD;  Location: [facility]COPY;  Service: Endoscopy;  Laterality: N/A;   Social History:  reports that she has never smoked. She has never used smokeless tobacco. She reports that she does not drink alcohol or use illicit drugs.  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  Dtr lives in [place]. 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  Pt is motivated to get better, but has always put her  family first.  She is working on making herself her new priority. Recent Psychosocial Issues: Pt seems very overwhelmed by everything happening around her - recent death of husband, son diagnosed with MS, pt going on O2,  move to [place] 18 months ago.  She is strong willed and is trying to move forward and go through paperwork husband left behind, but was struggling with fatigue and what she thought was grief, but she was having medical issues in addition.  Pt is somewhat relieved to know that there is a reason for how she has been feeling.   Psychiatric History: Pt with a hx of anxiety and depression.  Has taken medications on and off throughout her life. Substance Abuse History: None reported  Patient / Family Perceptions, Expectations & Goals Pt/Family understanding of illness & functional limitations: Pt expressed understanding of her illness and limitations and is hopeful for recovery. Premorbid pt/family roles/activities: Pt likes to go out to eat with her sunday school class, watch the animals in her yard, read, spend time with family around the [place], get her hair done. Anticipated changes in roles/activities/participation: Pt hopes to resume her activities she was doing PTA. Pt/family expectations/goals: Pt would like to "be able to walk without feeling dizzy and passing out.  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 Hired privately-did not seem an agency was involved) Premorbid Home Care/DME Agencies: Other (Comment) ([facility] for O2) Transportation available at discharge: son Resource referrals recommended: Neuropsychology;Support group (specify)  Discharge Planning Living Arrangements: Alone Support Systems: Children;Friends/neighbors;Church/faith community Type of Residence: Private residence Insurance Resources: [place] (specify) ([place] as a secondary) Pensions consultant: Fish farm manager;Other (Comment) (retirement) Financial Screen Referred: No Living Expenses: Own Money Management: Patient Does the patient have any problems obtaining your medications?: No Home Management: Pt has someone  to help clean the house every other week.  Her grandson does the yard work. Patient/Family Preliminary Plans: Pt wants to go home to her home independently.  She can hire some caregivers, if needed, but not sure how long she can afford this. Barriers to Discharge: Steps Social Work Anticipated Follow Up Needs: HH/OP;Support Group Expected length of stay: 7-10 days  Clinical Impression CSW met with pt to introduce self and role of CSW as well as complete assessment.  Pt was talkative with CSW but was quiet and slow to express herself at times.  Pt is grieving the death of her husband who died in 04-17-2014.  She has good family support, but they are all very busy with different activities/work.  Her dtr also lives in [place], so she only visits a few times a year.  Pt is aware of her anxiety, depression, and grief impacting her health.  She further acknowledges that she has not prioritized herself in many years, always putting her husband and children first.  CSW talked at length with pt about her worth and the importance of caring for herself so that she can be here for her family.  This made sense to pt and she gave CSW permission to remind her of her worth.  Pt admits to not eating or drinking properly and she hopes to improve this.  CSW feels pt would benefit from grief support and perhaps ongoing counseling.  Neuropsychology referral has been made and pt is agreeable to this visit and seemed interested in strategies to help her cope better with the grief, anxiety, and depression.  Pt hopes to  be independent at d/c, as she is not sure who could be with her 24/7 or how long she could pay for assistance.   Pt's son and family are at the beach until the weekend.  Pt has good friend/church/neighbor support and people continue to visit while family is gone.   CSW will assist pt as needed with d/c planning and support while on CIR.  Leigha Olberding, Silvestre Mesi 07/28/2014, 1:53 PM

## 2014-07-28 NOTE — Progress Notes (Signed)
Occupational Therapy Session Note  Patient Details  Name: Linda [name] MRN: [id number] Date of Birth: April 09, 1946  Today's Date: 07/28/2014 Time: 0728-0829 Time Calculation (min): 61 min  Short Term Goals: Week 1:  OT Short Term Goal 1 (Week 1): Short Term Goals = Long Term Goal secondary to ELOS  Skilled Therapeutic Interventions/Progress Updates:  0728-0829 - 25 Minutes Individual Therapy Patient with 5/10 complaints of pain in lower back"DDD".  Patient received supine in bed with HOB raised, eating breakfast. RN present for medication management. Patient engaged in bed mobility and sat EOB with supervision; no orthostatic symptoms.Therapist donned abdominal binder while patient seated EOB. Patient then stood with RW and ambulated into shower; during this functional ambulation, patient had no complaints of orthostatic symptoms. Patient sat on tub transfer bench and completed UB/LB bathing in sit<>stand position at shower level. BP=126/69 and 02 sats=96% after shower. Patient dried, then completed UB/LB dressing in sit<>stand position from bench. BP=95/59 and 02 sats =96% after dressing. Therapist donned abdominal binder, then patient ambulated back to bed with LOB and wobbly gait, therapist assisted with re-correcting. Patient laid in supine to take a rest break while therapist donned bilateral TEDs. Patient then sat EOB to donn bilateral socks. Patient stood and BP=141/72. Patient transferred > recliner and completed grooming task of brushing teeth while seated in recliner. At end of session, left patient seated in recliner with all needs within reach. Patient with lightheadedness and complaints of "fuzzy vision" during most transitional movements.   Precautions:  Precautions Precautions: Fall Precaution Comments: patient with low 02 sats, low BP readings; patient has tendency to "pass out" per her report Restrictions Weight Bearing Restrictions: No  See FIM for current functional  status  Therapy/Group: Individual Therapy  Jessyca Sloan 07/28/2014, 8:37 AM

## 2014-07-28 NOTE — Progress Notes (Signed)
Progress Note from the Palliative Medicine Team at [facility]: I spent time with Linda [name] today discussing grief and the process. She tells me how she initially felt like she wanted to go home and not to rehab but then remembered that her husband wasn't there and she was alone. She says that her children do not talk about their father and she really has nobody to talk too. I continue to encourage her to try and visit hospice for grief counseling upon discharge. I provided listening, validation of feelings and emotional support.     Objective: Allergies  Allergen Reactions  . Penicillins Itching, Swelling and Rash    Tolerated Cefepime in ED.   Scheduled Meds: . amitriptyline  100 mg Oral QHS  . atorvastatin  20 mg Oral q1800  . folic acid  2 mg Oral Daily  . lidocaine  1 patch Transdermal Q24H  . nebivolol  5 mg Oral Daily  . pantoprazole  40 mg Oral Q0600  . PARoxetine  20 mg Oral Daily  . polyethylene glycol  17 g Oral BID  . pregabalin  200 mg Oral Daily  . pregabalin  400 mg Oral QHS  . senna-docusate  2 tablet Oral BID  . traZODone  100-200 mg Oral QHS   Continuous Infusions:  PRN Meds:.albuterol, ALPRAZolam, alum & mag hydroxide-simeth, bisacodyl, diphenhydrAMINE, guaiFENesin-dextromethorphan, nitroGLYCERIN, ondansetron (ZOFRAN) IV, ondansetron, oxyCODONE-acetaminophen  BP 122/73  Pulse 67  Temp(Src) 98.1 F (36.7 C) (Oral)  Resp 17  Wt 74.7 kg (164 lb 10.9 oz)  SpO2 95%   PPS: 60%     Intake/Output Summary (Last 24 hours) at 07/28/14 1408 Last data filed at 07/28/14 1400  Gross per 24 hour  Intake    720 ml  Output      0 ml  Net    720 ml      LBM: 07/28/15      Physical Exam:  General: NAD, resting in bed, pleasant  HEENT: [place]/AT, no JVD, moist mucous membranes  Chest: No labored breathing, symmetric  CVS: RRR  Abdomen: Soft, NT, ND  Ext: MAE, no edema, warm to touch  Neuro: Awake, alert, oriented x 3, follows commands   Labs: CBC     Component Value Date/Time   WBC 6.3 07/27/2014 0540   WBC 6.1 07/08/2014 1221   RBC 3.86* 07/27/2014 0540   RBC 3.56* 07/25/2014 0505   RBC 4.13 07/08/2014 1221   HGB 11.4* 07/27/2014 0540   HGB 12.8 07/08/2014 1221   HCT 36.0 07/27/2014 0540   HCT 38.6 07/08/2014 1221   PLT PLATELET CLUMPS NOTED ON SMEAR, COUNT APPEARS ADEQUATE 07/27/2014 0540   PLT 226 07/08/2014 1221   MCV 93.3 07/27/2014 0540   MCV 94 07/08/2014 1221   MCH 29.5 07/27/2014 0540   MCH 31.0 07/08/2014 1221   MCHC 31.7 07/27/2014 0540   MCHC 33.2 07/08/2014 1221   RDW 15.7* 07/27/2014 0540   RDW 12.3 07/08/2014 1221   LYMPHSABS 1.3 07/27/2014 0540   LYMPHSABS 1.5 07/08/2014 1221   MONOABS 0.6 07/27/2014 0540   EOSABS 0.2 07/27/2014 0540   EOSABS 0.1 07/08/2014 1221   BASOSABS 0.1 07/27/2014 0540   BASOSABS 0.0 07/08/2014 1221    BMET    Component Value Date/Time   NA 146 07/27/2014 0540   K 4.0 07/27/2014 0540   CL 103 07/27/2014 0540   CO2 27 07/27/2014 0540   GLUCOSE 83 07/27/2014 0540   BUN 12 07/27/2014 0540  CREATININE 0.78 07/27/2014 0540   CREATININE 0.65 05/27/2013 1706   CALCIUM 9.0 07/27/2014 0540   GFRNONAA 84* 07/27/2014 0540   GFRAA >90 07/27/2014 0540    CMP     Component Value Date/Time   NA 146 07/27/2014 0540   K 4.0 07/27/2014 0540   CL 103 07/27/2014 0540   CO2 27 07/27/2014 0540   GLUCOSE 83 07/27/2014 0540   BUN 12 07/27/2014 0540   CREATININE 0.78 07/27/2014 0540   CREATININE 0.65 05/27/2013 1706   CALCIUM 9.0 07/27/2014 0540   PROT 6.6 07/27/2014 0540   ALBUMIN 3.4* 07/27/2014 0540   AST 20 07/27/2014 0540   ALT 16 07/27/2014 0540   ALKPHOS 68 07/27/2014 0540   BILITOT 0.3 07/27/2014 0540   GFRNONAA 84* 07/27/2014 0540   GFRAA >90 07/27/2014 0540    Assessment and Plan:  1. Code Status: FULL 2. Symptom Control:  1. Anxiety/Agitation: Xanax TID prn.  2. Insomnia: Trazodone qhs prn.  3. Pain: Nitroglycerin prn chest pain. Percocet 5-325 mg every 4 hours prn. Lidoderm patch to lower back.  4. Nausea/Vomiting: Ondansetron prn.  3. Psycho/Social:  Emotional support provided to Linda [name].  4. Spiritual: Support from personal pastor and church friends. 5. Disposition: Hopeful for home but may need assistance - needs to be determined by rehab.    Time In Time Out Total Time Spent with Patient Total Overall Time  1220 1300 21min 67min    Greater than 50%  of this time was spent counseling and coordinating care related to the above assessment and plan.  Vinie Sill, NP Palliative Medicine Team Pager # [phone] (M-F 8a-5p) Team Phone # [phone] (Nights/Weekends)

## 2014-07-28 NOTE — Progress Notes (Signed)
[facility] PHYSICAL MEDICINE & REHABILITATION     PROGRESS NOTE    Subjective/Complaints: Up working with OT already this am.   Objective: Vital Signs: Blood pressure 126/75, pulse 72, temperature 97.5 F (36.4 C), temperature source Oral, resp. rate 18, weight 74.7 kg (164 lb 10.9 oz), SpO2 97.00%. No results found.  Recent Labs  07/26/14 0456 07/27/14 0540  WBC 6.2 6.3  HGB 10.1* 11.4*  HCT 32.5* 36.0  PLT 191 PLATELET CLUMPS NOTED ON SMEAR, COUNT APPEARS ADEQUATE    Recent Labs  07/26/14 0456 07/27/14 0540  NA 142 146  K 3.8 4.0  CL 103 103  GLUCOSE 88 83  BUN 12 12  CREATININE 0.82 0.78  CALCIUM 8.9 9.0   CBG (last 3)  No results found for this basename: GLUCAP,  in the last 72 hours  Wt Readings from Last 3 Encounters:  07/28/14 74.7 kg (164 lb 10.9 oz)  07/26/14 62.1 kg (136 lb 14.5 oz)  07/26/14 62.1 kg (136 lb 14.5 oz)    Physical Exam:  Constitutional: She is oriented to person, place, and time. She appears well-developed and well-nourished.  No distress HENT: oral mucosa pink, dentition good  Head: Normocephalic and atraumatic.  Eyes: Conjunctivae are normal. Pupils are equal, round, and reactive to light.  Neck: Normal range of motion. Neck supple.  Cardiovascular: Normal rate and regular rhythm. No murmurs  Respiratory: Effort normal and breath sounds normal. No respiratory distress. She has no wheezes.  GI: Soft. Bowel sounds are normal. She exhibits no distension. There is no tenderness.  Neurological: She is alert and oriented to person, place, and time. Mild sensory loss over right foot and ulnar hand is persistent---otherwise normal sensory exam. Strength grossly 4+/5 in bilateral UE's. LE's 4/5 HF and Knees, 4/5 ankles.. Normal rom and fmc. Cognitively displays good insight and awareness.  Skin: Skin is warm and dry.  Psychiatric: Her speech is normal and behavior is normal. Judgment normal. Her mood is slightly anxious. She is not slowed  and not withdrawn. Cognition and memory are normal.  Mentioned loss of husband couple of time during conversation   Assessment/Plan: 1. Functional deficits secondary to gait disorder/deconditioning which require 3+ hours per day of interdisciplinary therapy in a comprehensive inpatient rehab setting. Physiatrist is providing close team supervision and 24 hour management of active medical problems listed below. Physiatrist and rehab team continue to assess barriers to discharge/monitor patient progress toward functional and medical goals. FIM: FIM - Bathing Bathing Steps Patient Completed: Chest;Right Arm;Left Arm;Abdomen;Front perineal area;Buttocks;Right upper leg;Left upper leg;Right lower leg (including foot);Left lower leg (including foot) Bathing: 5: Supervision: Safety issues/verbal cues  FIM - Upper Body Dressing/Undressing Upper body dressing/undressing steps patient completed: Thread/unthread right bra strap;Thread/unthread left bra strap;Hook/unhook bra;Thread/unthread right sleeve of pullover shirt/dresss;Thread/unthread left sleeve of pullover shirt/dress;Pull shirt over trunk Upper body dressing/undressing: 5: Set-up assist to: Obtain clothing/put away FIM - Lower Body Dressing/Undressing Lower body dressing/undressing steps patient completed: Thread/unthread right pants leg;Thread/unthread left pants leg;Pull pants up/down;Don/Doff right sock;Don/Doff left sock;Fasten/unfasten pants;Thread/unthread right underwear leg;Thread/unthread left underwear leg;Pull underwear up/down Lower body dressing/undressing: 4: Steadying Assist (steady assist during standing)  FIM - Toileting Toileting: 0: Activity did not occur  FIM - Air cabin crew Transfers: 0-Activity did not occur  FIM - Control and instrumentation engineer Devices: Arm rests Bed/Chair Transfer: 6: Supine > Sit: No assist;7: Sit > Supine: No assist;4: Bed > Chair or W/C: Min A (steadying Pt. > 75%);4:  Chair or W/C >  Bed: Min A (steadying Pt. > 75%)  FIM - Locomotion: Wheelchair Distance: 150 Locomotion: Wheelchair: 5: Travels 150 ft or more: maneuvers on rugs and over door sills with supervision, cueing or coaxing FIM - Locomotion: Ambulation Locomotion: Ambulation Assistive Devices: Administrator Ambulation/Gait Assistance: 4: Min assist Locomotion: Ambulation: 1: Travels less than 50 ft with minimal assistance (Pt.>75%)  Comprehension Comprehension Mode: Auditory Comprehension: 6-Follows complex conversation/direction: With extra time/assistive device  Expression Expression Mode: Verbal Expression: 6-Expresses complex ideas: With extra time/assistive device  Social Interaction Social Interaction: 6-Interacts appropriately with others with medication or extra time (anti-anxiety, antidepressant).  Problem Solving Problem Solving: 5-Solves complex 90% of the time/cues < 10% of the time  Memory Memory: 6-More than reasonable amt of time  Medical Problem List and Plan:  1. Functional deficits secondary to Gait disorder due to multiple medical and psychosocial issues---most notably her GI blood loss and loss of husband.  2. DVT Prophylaxis/Anticoagulation: SCD's, ambulation 3. Back pain/fibromyalgia/Pain Management: Will continue percocet prn. Continue lidocaine patch, Lyrica as well as elavil.   Off savella.  4. Depression with anxiety/Grief reaction/Mood: Continue paxil with xanax prn. Team to provide ego support. Will have LCSW follow for support and evaluation. Neuropsych to follow up for support  5. Neuropsych: This patient is capable of making decisions on her own behalf.  6. Skin/Wound Care: Routine pressure relief measures. Added nutritional supplement.  7. Gastritis: Continue PPI. Serial check of H/H. Continue ASA.  8. Acute on chronic anemia: Iron supplemented and to follow up with hem/onc past discharge. Continue folic acid.  9. Dizziness: Check orthostatic blood  pressures.   TED and binder to help with symptoms. H/o BPPV in the past.  10 Right petrous bone mass: will need follow up MRI for work up.  11. Chronic insomnia: CPAP not functional---will need to follow up with pulmonary to reschedule study. Continue trazodone at bedtime. --increased to 100-200mg  qhs per home routine 12. Non-obst CAD with angina: continue ASA with bystolic daily. Off imdur d/c due to orthostatic changes. Monitor for symptoms with increase in activity.  92. [name] with asthma: Oxygen dependent. Albuterol  prn.   LOS (Days) 2 A FACE TO FACE EVALUATION WAS PERFORMED  Linda [name] 07/28/2014 10:11 AM

## 2014-07-28 NOTE — Plan of Care (Signed)
Problem: RH PAIN MANAGEMENT Goal: RH STG PAIN MANAGED AT OR BELOW PT'S PAIN GOAL Outcome: Not Progressing Pain occurs approx. Every three hours.  Dosing only available Q4.  RN recommends pain medication reassessment.

## 2014-07-28 NOTE — Progress Notes (Signed)
Note/chart reviewed.  Katie Franciszek Platten, RD, LDN Pager #: [phone] After-Hours Pager #: [phone]  

## 2014-07-28 NOTE — Progress Notes (Signed)
Physical Therapy Session Note  Patient Details  Name: Linda [name] MRN: [id number] Date of Birth: 1946/05/19  Today's Date: 07/28/2014 Time: 0728-0829 Time Calculation (min): 61 min  Short Term Goals: Week 1:  PT Short Term Goal 1 (Week 1): STG=LTGs  Skilled Therapeutic Interventions/Progress Updates:  AM Session (50 min): Pt. Received semi-reclined in bed. Pt. Agreeable to physical therapy. Donned TED and abdominal binder in supine. Orthostatics taken. Pt. EOB for activity tolerance.  1:1 (physician present for 10 of therapy session discussing patient status and status of patient care) Treatment focused on activity tolerance, functional transfers, balance control and gait. Pt. Assist level is min A with RW; pt prone to syncope with ambulation requiring max A for stand<>sit transfer x1 during session. Pt. Ambulated: 5 feet, 10 feet, and 15 feet with therapeutic rest/recovery breaks prior to syncope. Pt. Bed mobility with transfers: supine<>sit, sit<>Stand with orthostatics being monitored for activity. Pt. Required frequent seated rest breaks for static standing activities. Pt. Presents with posterior lean upon standing requiring near continuous tactile cues for upright posture. Pt. Participation is great, with little to no body awareness as to when status changes related to orthostatic hypotension. Pt. Coached on identifying and properly planning safety related to current symptom presentation.  Pain : see below  Pt. Condition post therapy session: positioned semi-reclined in bed with all needs within reach.   PM Session (70 min): Pt. Received semi-reclined in bed with legs positioned flexed for comfort. Pt. Agreeable to therapy session. Pt. On 3L/min O2 nasal canula. Pt. continued 3L/min O2 with portable tank during tx session. TED hose and abdominal binder donned.  1:1 Treatment focused on activity tolerance; gait; w/c mobility. Pt. Assist level is min A to max A with RW due to  orthostasis. W/C mgmt is mod A due to fatigue and propulsion training. Pt. Required frequent rest/recovery breaks due to quickly fatiguing; pt. Determined and pushed through light-headedness; coached on energy conservation and proving verbal cues to therapist when in therapy session. Pt. Demonstrates increased syncope with greater ambulation distances: 15 feet with w/c follow, 20 feet with w/c follow and >25 feet with syncope episode and max A to w/c.  Pain: 8/10 R hip and bilateral knees due to pt. Indicated OA. Pt. States this pain is limiting therapy participation. RN notified and provided medication to address pain management needs.  Pt. Condition post therapy session: positioned semi-reclined in bed with all needs within reach. Bed alarm on for patient safety.   Therapy Documentation Precautions:  Precautions Precautions: Fall Precaution Comments: patient with low 02 sats, low BP readings; patient has tendency to "pass out" per her report Restrictions Weight Bearing Restrictions: No Vital Signs: Therapy Vitals Patient Position (if appropriate): Orthostatic Vitals Oxygen Therapy SpO2: 94 % O2 Device: Nasal cannula O2 Flow Rate (L/min): 3 L/min Pulse Oximetry Type: Intermittent Pain: Pain Assessment Pain Assessment: 0-10 Pain Score: 3  Faces Pain Scale: Hurts a little bit Pain Type: Chronic pain Pain Location: Back Pain Orientation: Lower Pain Descriptors / Indicators: Aching;Radiating Pain Onset: On-going Patients Stated Pain Goal: 0 Pain Intervention(s): RN made aware PAINAD (Pain Assessment in Advanced Dementia) Breathing: normal  See FIM for current functional status  Therapy/Group: Individual Therapy  Juluis Mire 07/28/2014, 10:45 AM

## 2014-07-28 NOTE — Progress Notes (Signed)
Inpatient [facility] Individual Statement of Services  Patient Name:  Linda [name]  Date:  07/28/2014  Welcome to the [facility].  Our goal is to provide you with an individualized program based on your diagnosis and situation, designed to meet your specific needs.  With this comprehensive rehabilitation program, you will be expected to participate in at least 3 hours of rehabilitation therapies Monday-Friday, with modified therapy programming on the weekends.  Your rehabilitation program will include the following services:  Physical Therapy (PT), Occupational Therapy (OT), 24 hour per day rehabilitation nursing, Neuropsychology, Case Management (Social Worker), Rehabilitation Medicine, Nutrition Services and Pharmacy Services  Weekly team conferences will be held on Tuesdays to discuss your progress.  Your Social Worker will talk with you frequently to get your input and to update you on team discussions.  Team conferences with you and your family in attendance may also be held.  Expected length of stay:  7 to 10 days  Overall anticipated outcome:  Modified Independent, unless dizziness and passing out continues, then you will need supervision  Depending on your progress and recovery, your program may change. Your Social Worker will coordinate services and will keep you informed of any changes. Your Social Worker's name and contact numbers are listed  below.  The following services may also be recommended but are not provided by the [facility] will be made to provide these services after discharge if needed.  Arrangements include referral to agencies that provide these services.  Your insurance has been verified to be:  Medicare and [employer] Your primary doctor is:  [name]  Pertinent information  will be shared with your doctor and your insurance company.  Social Worker:  Alfonse Alpers, LCSW  [phone] or [phone]  Information discussed with and copy given to patient by: Trey Sailors, 07/28/2014, 10:47 AM

## 2014-07-29 ENCOUNTER — Inpatient Hospital Stay (HOSPITAL_COMMUNITY): Payer: Medicare Other

## 2014-07-29 ENCOUNTER — Encounter (HOSPITAL_COMMUNITY): Payer: Medicare Other | Admitting: Occupational Therapy

## 2014-07-29 DIAGNOSIS — D62 Acute posthemorrhagic anemia: Secondary | ICD-10-CM

## 2014-07-29 MED ORDER — HYDROCERIN EX CREA
TOPICAL_CREAM | Freq: Two times a day (BID) | CUTANEOUS | Status: DC
  Administered 2014-07-29 – 2014-07-30 (×3): via TOPICAL
  Administered 2014-07-30: 1 via TOPICAL
  Administered 2014-07-31: 20:00:00 via TOPICAL
  Administered 2014-07-31: 1 via TOPICAL
  Administered 2014-08-01 – 2014-08-08 (×14): via TOPICAL
  Filled 2014-07-29: qty 113

## 2014-07-29 MED ORDER — TRAMADOL HCL 50 MG PO TABS
50.0000 mg | ORAL_TABLET | Freq: Four times a day (QID) | ORAL | Status: DC | PRN
  Administered 2014-07-29 – 2014-08-08 (×14): 50 mg via ORAL
  Filled 2014-07-29 (×14): qty 1

## 2014-07-29 NOTE — IPOC Note (Signed)
Overall Plan of Care [facility]) Patient Details Name: KIMONI PICKERILL MRN: [id number] DOB: [birth year]  Admitting Diagnosis: Gait disorder  Hospital Problems: Active Problems:   Multifactorial gait disorder     Functional Problem List: Nursing    PT Balance;Endurance;Motor;Pain;Safety;Sensory  OT Balance;Endurance;Motor;Pain;Perception;Safety;Sensory  SLP    TR Endurance;Motor;Pain       Basic ADL's: OT Grooming;Bathing;Dressing;Toileting     Advanced  ADL's: OT Simple Meal Preparation     Transfers: PT Bed Mobility;Bed to Chair;Car;Furniture  OT Toilet;Tub/Shower     Locomotion: PT Ambulation;Wheelchair Mobility;Stairs     Additional Impairments: OT None  SLP        TR      Anticipated Outcomes Item Anticipated Outcome  Self Feeding independent  Swallowing      Basic self-care  setting goals for mod I  Toileting  setting goals for mod I   Bathroom Transfers settin goals for mod I  Bowel/Bladder     Transfers  Mod I   Locomotion  Mod I with LRAD  Communication     Cognition     Pain     Safety/Judgment      Therapy Plan: PT Intensity: Minimum of 1-2 x/day ,45 to 90 minutes PT Frequency: 5 out of 7 days PT Duration Estimated Length of Stay: 7-10 days OT Intensity: Minimum of 1-2 x/day, 45 to 90 minutes OT Frequency: 5 out of 7 days OT Duration/Estimated Length of Stay: 7-10 days         Team Interventions: Nursing Interventions    PT interventions Ambulation/gait training;Balance/vestibular training;Community reintegration;Discharge planning;Disease management/prevention;DME/adaptive equipment instruction;Functional mobility training;Neuromuscular re-education;Patient/family education;Pain management;Psychosocial support;Stair training;Therapeutic Activities;Therapeutic Exercise;UE/LE Coordination activities;UE/LE Strength taining/ROM;Wheelchair propulsion/positioning  OT Interventions Balance/vestibular training;Community  reintegration;Discharge planning;DME/adaptive equipment instruction;Functional mobility training;Pain management;Patient/family education;Psychosocial support;Splinting/orthotics;Skin care/wound managment;Self Care/advanced ADL retraining;Therapeutic Activities;Therapeutic Exercise;UE/LE Strength taining/ROM;UE/LE Coordination activities  SLP Interventions    TR Interventions    SW/CM Interventions Discharge Planning;Psychosocial Support;Patient/Family Education    Team Discharge Planning: Destination: PT-Home ,OT- Home , SLP-  Projected Follow-up: PT-Home health PT, OT-  Home health OT, SLP-  Projected Equipment Needs: PT-Cane;Rolling walker with 5" wheels;To be determined, OT- 3 in 1 bedside comode;Tub/shower bench, SLP-  Equipment Details: PT-Pt may require a w/c if sensation of being about to black out does not pass by time of d/c. , OT-  Patient/family involved in discharge planning: PT- Patient,  OT-Patient, SLP-   MD ELOS: 7-10 days Medical Rehab Prognosis:  Excellent Assessment: The patient has been admitted for CIR therapies with the diagnosis of deconditioning after acute blood loss and multiple medical issues. The team will be addressing functional mobility, strength, stamina, balance, safety, adaptive techniques and equipment, self-care, bowel and bladder mgt, patient and caregiver education, pain mgt, ego support, exercise tolerance, community reintegration, leisure awareness. Goals have been set at mod I for mobility and self-care.    Meredith Staggers, MD, FAAPMR      See Team Conference Notes for weekly updates to the plan of care

## 2014-07-29 NOTE — Progress Notes (Signed)
Occupational Therapy Session Note  Patient Details  Name: SEDONIA KITNER MRN: [id number] Date of Birth: 21-Jul-1946  Today's Date: 07/29/2014 Time: 0730-0830 Time Calculation (min): 60 min  Short Term Goals: Week 1:  OT Short Term Goal 1 (Week 1): Short Term Goals = Long Term Goal secondary to ELOS  Skilled Therapeutic Interventions/Progress Updates:  Patient with "at least 6/10" complaints of pain in lower back, patient stated she got something for pain earlier this am. Patient received supine in bed. Patient engaged in bed mobility and sat EOB, with abdominal binder BP=111/47; therapist notified RN of this. Patient laid back in supine and therapist donned bilateral TEDs. Patient then sat EOB and therapist rechecked BP=122/73. Patient transferred > w/c and performed ADL in sit<>stand position. During session, patient with increased fatigue. After ADL, BP=142/75. From here, patient worked on w/c propulsion and went to ADL apartment for education on tub/shower transfers on/off tub transfer bench; patient steady assist for this functional transfer. Therapist propelled patient back to room and left seated in w/c with 3 liters of 02 donned and all needs within reach. During session, when patient on 3 liter of 02 sats read 100% and when patient on room air, sats read <94%.   Precautions:  Precautions Precautions: Fall Precaution Comments: 3 L/min O2 with activity Required Braces or Orthoses: Other Brace/Splint Other Brace/Splint: abdominal binder Restrictions Weight Bearing Restrictions: No  See FIM for current functional status  Therapy/Group: Individual Therapy  Zaire Vanbuskirk 07/29/2014, 8:45 AM

## 2014-07-29 NOTE — Progress Notes (Signed)
[facility] PHYSICAL MEDICINE & REHABILITATION     PROGRESS NOTE    Subjective/Complaints: Slept fairly well. Had a good day with therapy. Having some low back pain at times. Still gets short of breath with activity   Objective: Vital Signs: Blood pressure 129/70, pulse 64, temperature 97.8 F (36.6 C), temperature source Oral, resp. rate 18, weight 75.5 kg (166 lb 7.2 oz), SpO2 98.00%. No results found.  Recent Labs  07/27/14 0540  WBC 6.3  HGB 11.4*  HCT 36.0  PLT PLATELET CLUMPS NOTED ON SMEAR, COUNT APPEARS ADEQUATE    Recent Labs  07/27/14 0540  NA 146  K 4.0  CL 103  GLUCOSE 83  BUN 12  CREATININE 0.78  CALCIUM 9.0   CBG (last 3)  No results found for this basename: GLUCAP,  in the last 72 hours  Wt Readings from Last 3 Encounters:  07/29/14 75.5 kg (166 lb 7.2 oz)  07/26/14 62.1 kg (136 lb 14.5 oz)  07/26/14 62.1 kg (136 lb 14.5 oz)    Physical Exam:  Constitutional: She is oriented to person, place, and time. She appears well-developed and well-nourished.  No distress.  HENT: oral mucosa pink, dentition good  Head: Normocephalic and atraumatic.  Eyes: Conjunctivae are normal. Pupils are equal, round, and reactive to light.  Neck: Normal range of motion. Neck supple.  Cardiovascular: Normal rate and regular rhythm. No murmurs  Respiratory: Effort normal and breath sounds normal. No respiratory distress. She has no wheezes. O2 via [place]. GI: Soft. Bowel sounds are normal. She exhibits no distension. There is no tenderness.  Neurological: She is alert and oriented to person, place, and time. Mild sensory loss over right foot and ulnar hand is persistent---otherwise normal sensory exam. Strength grossly 4+/5 in bilateral UE's. LE's 4/5 HF and Knees, 4/5 ankles.. Normal rom and fmc. Cognitively displays good insight and awareness.  Skin: Skin is warm and dry.  Psychiatric: Her speech is normal and behavior is normal. Judgment normal. Her mood remains slightly  anxious.  Cognition and memory are normal.    Assessment/Plan: 1. Functional deficits secondary to gait disorder/deconditioning which require 3+ hours per day of interdisciplinary therapy in a comprehensive inpatient rehab setting. Physiatrist is providing close team supervision and 24 hour management of active medical problems listed below. Physiatrist and rehab team continue to assess barriers to discharge/monitor patient progress toward functional and medical goals. FIM: FIM - Bathing Bathing Steps Patient Completed: Chest;Right Arm;Left Arm;Abdomen;Front perineal area;Buttocks;Right upper leg;Left upper leg;Right lower leg (including foot);Left lower leg (including foot) Bathing: 5: Supervision: Safety issues/verbal cues  FIM - Upper Body Dressing/Undressing Upper body dressing/undressing steps patient completed: Thread/unthread right bra strap;Thread/unthread left bra strap;Hook/unhook bra;Thread/unthread right sleeve of pullover shirt/dresss;Thread/unthread left sleeve of pullover shirt/dress;Pull shirt over trunk Upper body dressing/undressing: 5: Set-up assist to: Obtain clothing/put away FIM - Lower Body Dressing/Undressing Lower body dressing/undressing steps patient completed: Thread/unthread right pants leg;Thread/unthread left pants leg;Pull pants up/down;Don/Doff right sock;Don/Doff left sock;Fasten/unfasten pants;Thread/unthread right underwear leg;Thread/unthread left underwear leg;Pull underwear up/down Lower body dressing/undressing: 4: Steadying Assist (steady assist during standing)  FIM - Toileting Toileting steps completed by patient: Adjust clothing prior to toileting;Performs perineal hygiene;Adjust clothing after toileting Toileting: 0: Activity did not occur  FIM - Air cabin crew Transfers: 0-Activity did not occur  FIM - Control and instrumentation engineer Devices: Bed rails Bed/Chair Transfer: 6: Supine > Sit: No assist;7: Sit > Supine: No  assist  FIM - Locomotion: Wheelchair Distance: 150 Locomotion: Wheelchair: 5:  Travels 150 ft or more: maneuvers on rugs and over door sills with supervision, cueing or coaxing FIM - Locomotion: Ambulation Locomotion: Ambulation Assistive Devices: Walker - Rolling Ambulation/Gait Assistance: 2: Max assist Locomotion: Ambulation: 1: Travels less than 50 ft with maximal assistance (Pt: 25 - 49%) (syncope occurs >12 feet)  Comprehension Comprehension Mode: Auditory Comprehension: 6-Follows complex conversation/direction: With extra time/assistive device  Expression Expression Mode: Verbal Expression: 6-Expresses complex ideas: With extra time/assistive device  Social Interaction Social Interaction: 6-Interacts appropriately with others with medication or extra time (anti-anxiety, antidepressant).  Problem Solving Problem Solving: 5-Solves complex 90% of the time/cues < 10% of the time  Memory Memory: 6-More than reasonable amt of time  Medical Problem List and Plan:  1. Functional deficits secondary to Gait disorder due to multiple medical and psychosocial issues---most notably her GI blood loss and loss of husband.  2. DVT Prophylaxis/Anticoagulation: SCD's, ambulation 3. Back pain/fibromyalgia/Pain Management: Will continue percocet prn. Continue lidocaine patch, Lyrica as well as elavil.   Off savella.   -emphasized good posture, OOB as well 4. Depression with anxiety/Grief reaction/Mood: Continue paxil with xanax prn. Team to provide ego support. Will have LCSW follow for support and evaluation. Neuropsych to follow up for support  5. Neuropsych: This patient is capable of making decisions on her own behalf.  6. Skin/Wound Care: Routine pressure relief measures. Added nutritional supplement.  7. Gastritis: Continue PPI. Serial check of H/H. Continue ASA.  8. Acute on chronic anemia: Iron supplemented and to follow up with hem/onc past discharge. Continue folic acid.  9.  Dizziness: Check orthostatic blood pressures.   TED and binder to help with symptoms. H/o BPPV in the past.  10 Right petrous bone mass: will need follow up MRI for work up.  11. Chronic insomnia: CPAP not functional---will need to follow up with pulmonary to reschedule study. Continue trazodone at bedtime. --increased to 100-200mg  qhs per home routine 12. Non-obst CAD with angina: continue ASA with bystolic daily. Off imdur d/c due to orthostatic changes. Monitor for symptoms with increase in activity.  60. [name] with asthma: Oxygen dependent. Albuterol  prn.   LOS (Days) 3 A FACE TO FACE EVALUATION WAS PERFORMED  Eriq Hufford T 07/29/2014 10:10 AM

## 2014-07-29 NOTE — Progress Notes (Signed)
I continue to talk with Ms. Desantiago about her husband. She tells me how she asked her son how he is dealing with the loss of his father because he never talks about him. He told her that he thinks about him 100 times a day but they are fond memories and he finds peace in the fact that his father went quickly and did not have to suffer through more chemotherapy and weakness. She says that she finds peace in this and is happy for him in retrospect but is still in shock because she "thought he had two more years left." I encourage her to talk about these feelings. I will continue to follow on Monday.   Vinie Sill, NP Palliative Medicine Team Pager # [phone] (M-F 8a-5p) Team Phone # [phone] (Nights/Weekends)

## 2014-07-29 NOTE — Progress Notes (Signed)
Physical Therapy Session Note  Patient Details  Name: Linda [name] MRN: [id number] Date of Birth: Feb 08, 1946  Today's Date: 07/29/2014 Time: [phone] & [phone] Time Calculation (min): 60 min & 60 min.  Short Term Goals: Week 1:  PT Short Term Goal 1 (Week 1): STG=LTGs  Skilled Therapeutic Interventions/Progress Updates:  AM Session (60 min): Pt. Received semi-reclined in bed on 3L/min O2 via nasal canula. Pt. Indicates she is ready for therapy. Pt. Remained on 3L/min O2 via portable tank for entire session. Abdominal binder and TED hose donned for entire therapy session.  1:1 Treatment focused on w/c mgmt and mobility, functional transfers, activity tolerance and strengthening. Pt. Assist level is Min A with RW for stand-pivot transfers. Treatment consisted of car transfer training with demonstration and teach-back with various heights for car seat simulated. Pt. Performed with RW x1 and w/c>car x2. Pt. Requires frequent rest/recovery breaks due to orthostatic hypotension. Pt. W/c mgmt training for leg rest donning/doffing, positioning, propulsion, navigation/steering with explaination or demonstration; pt performed teach-back method during session to demonstrate understanding. NUSTEP lvl 5 for 12 non-consecutive minutes due to fatigue Pt. Response:  Pain: 5/10 pain addressed by RN during session. Right knee pain during nustep that subsided with rest.  Pt. Condition post therapy session: positioned supine in bed with HOB elevated, remained on 3L/min O2 with all needs within reach. Bed alarm armed for safety.   PM Session (60 min): Pt. Received semi-reclined in bed and agreeable to physical therapy. Pt. On 3L/min O2 via wall outlet and transferred to portable tank at same level. Abdominal binder and TED hose donned for entire therapy session.  1:1 Treatment focused on transfers, w/c mgmt with increased propulsion tolerance. Pt. Assist level is max A for transfers and (S) for w/c  mgmt. Treatment pt. Toileted with abdominal binder and TED hose with RW; experienced three separate syncope episodes (positioned chairs prior to ambulating for precaution): 8' and required max A for stand<>sit, 6' with grab bars and therapist min A to stand>sit to toilet, syncope upon toilet>stand for pulling up pants required max A and transfer stand>toilet. PT. Ambulated 10 feet to w/c with RW and therapist mod A to position patient for final syncope episode. Pt. Performed extensive w/c mgmt and propulsion with trials for activity tolerance and increased endurance. Pt. Propelled >200' x4 with frequent therapeutic rest/recovery breaks due to patient indicated asthma related symptoms. Pt. Navigated slight incline and transition of multiple controled surfaces.  Pain: no c/o pain. Indicated muscle soreness in R thigh due to earlier therapeutic session; relived with rest and repositioning.  Pt. Condition post therapy session: positioned semi-reclined in bed with all needs within reach.  Therapy Documentation Precautions:  Precautions Precautions: Fall Precaution Comments: 3 L/min O2 with activity Required Braces or Orthoses: Other Brace/Splint Other Brace/Splint: abdominal binder Restrictions Weight Bearing Restrictions: No Locomotion : Ambulation Ambulation/Gait Assistance: 2: Max Financial controller Distance: 200   See FIM for current functional status  Therapy/Group: Individual Therapy  Juluis Mire 07/29/2014, 4:03 PM

## 2014-07-30 ENCOUNTER — Inpatient Hospital Stay (HOSPITAL_COMMUNITY): Payer: Medicare Other | Admitting: Physical Therapy

## 2014-07-30 ENCOUNTER — Inpatient Hospital Stay (HOSPITAL_COMMUNITY): Payer: Medicare Other | Admitting: *Deleted

## 2014-07-30 DIAGNOSIS — Z5189 Encounter for other specified aftercare: Principal | ICD-10-CM

## 2014-07-30 DIAGNOSIS — R5381 Other malaise: Secondary | ICD-10-CM

## 2014-07-30 NOTE — Progress Notes (Signed)
Physical Therapy Session Note  Patient Details  Name: Linda [name] MRN: [id number] Date of Birth: 1946-06-14  Today's Date: 07/30/2014 Time: 4854-6270 Tx 2: 3500-9381 Time Calculation (min): 45 min Tx 2: 45 minutes  Short Term Goals: Week 1:  PT Short Term Goal 1 (Week 1): STG=LTGs  Skilled Therapeutic Interventions/Progress Updates:    Gait Training: PT instructs pt in ambulation with RW req CGA x 65' and pt then begins to stagger and PT assists her to sit. PT took BP immediately after ambulation and found it to be 125/69 with HR 82. PT explains to pt that if she is indeed experiencing orthostatic hypotension, then her body is adjusting back to normal during the 1-2 minutes it takes PT to set up BP cuff and take it. PT instructs pt in ascending/descending 2 stairs with B handrails x 2 reps req CGA for safety.   Therapeutic Activity:  PT instructs pt in sit to stand with RW req SBA and static standing while getting BP taken in standing to check for orthostatic hypotension pt req CGA for safety. PT instructs pt in habituation exercises for possible orthostatic BP issues: sit to stand at counter x 10 reps.   W/C Management: PT instructs pt in w/c propulsion x 100' + 150' with B UE req SBA for safety and assist with portable oxygen tank.  Session 2: Gait Training: PT instructs pt in ambulation with RW x 115' req CGA for safety and verbal cues to let PT know if/when she began to feel dizzy. PT notes pt do a small stumble and PT instructs pt to sit and take a rest break.   Therapeutic Exercise:  PT instructs pt in mat level exercises for generalized strengthening and activity tolerance: bridging, side lie hip abduction: 3 x 10 reps each.   W/C Management: PT instructs pt in w/c propulsion x 200' withy B UE req SBA and increased time, verbal cues for managing a turn through narrow gym door for technique.   Pt has on abdominal binder and TED hose throughout session. PT took BP in sit and  stand and notes it is not orthostatic. Pt demonstrates dizziness symptoms with gait and begins to stagger and when PT takes BP, it is not orthostatic. PT suspects a psychosomatic contribution to pt's c/o dizziness and difficulty with functional mobility, exacerbated by grief of recent death of husband. However, continue to treat the impairments seen. Pt will benefit from continued activity tolerance, standing balance reeducation, generalized strengthening & conditioning, and safety with gait (focus on endurance and energy conservation).    Therapy Documentation Precautions:  Precautions Precautions: Fall Precaution Comments: 3 L/min O2 with activity Required Braces or Orthoses: Other Brace/Splint Other Brace/Splint: abdominal binder Restrictions Weight Bearing Restrictions: No    Vital Signs: Therapy Vitals Pulse Rate: 68 BP: 114/53 mmHg Patient Position (if appropriate): Sitting Oxygen Therapy SpO2: 100 % O2 Device: None (Room air) Pain: Pain Assessment Pain Assessment: 0-10 Pain Score: 5  Pain Type: Chronic pain Pain Location: Back Pain Orientation: Lower Pain Descriptors / Indicators: Radiating (to R LE) Pain Onset: On-going Pain Intervention(s): Rest Multiple Pain Sites: No Tx 2: Pt c/o 5/10 low back pain radiating down R leg and PT utilizes distraction and rest. Pt also c/o 7/10 headache that started with lunch and PT utilizes rest and dimming lights in the ortho gym.   See FIM for current functional status  Therapy/Group: Individual Therapy  Kashana Breach M 07/30/2014, 10:45 AM

## 2014-07-30 NOTE — Progress Notes (Signed)
Occupational Therapy Session Note  Patient Details  Name: Linda [name] MRN: [id number] Date of Birth: 1946/03/31  Today's Date: 07/30/2014 Time:  - 0825-0925  (59min)  1st session    Short Term Goals: Week 1:  OT Short Term Goal 1 (Week 1): Short Term Goals = Long Term Goal secondary to ELOS :        Skilled Therapeutic Interventions/Progress Updates:  1st session:  Pain:   6 /10.  Pt. Lying in bed.  Monitored BP throughout session.  See below.  Applied binder and 0xygen on 3 liters.  Ambulated with RW to toilet and then to shower with minimal assist .  Pt. Slightly sluggish during one minute of shower, but stated she was okay.  Completed BADL at minimal assist level.  Used wc propulsion for back to side of bed.  Left pt in wc with call bell,phone within reach.    Time: 1500-1530  (30 min)  2nd session Pain:  6/10 back.  Pt has history of back pain due to DDD and OA Individual session 2nd session:  HOB at 45 degrees; BP= 98/54;;  EOB  123/71with abd binder. Pt. amublatated with RW to toilet with minimal assist.   Pt. Was SBA with toileting. Ambulated to sink and stood to do grooming with SBA.  Ambulated back to wc.  BP= 125/68.  Performed BUE AROM.  Left pt in wc with  call bell,phone within reach.     Therapy Documentation Precautions:  Precautions Precautions: Fall Precaution Comments: 3 L/min O2 with activity Required Braces or Orthoses: Other Brace/Splint Other Brace/Splint: abdominal binder Restrictions Weight Bearing Restrictions: No   Pain: Pain Assessment Pain Score:6  1st and 2nd session Pain Type: Chronic pain Pain Location: Back; radiates down right LE Pain Orientation: Lower Pain Descriptors / Indicators: Aching;Squeezing;Tightness Pain Onset: On-going Pain Intervention(s): Medication (See eMAR)  See FIM for current functional status  Therapy/Group: Individual Therapy  [name], [name] 07/30/2014, 8:02 AM

## 2014-07-30 NOTE — Progress Notes (Signed)
Linda [name] is a 68 y.o. female Oct 22, 1946 [id number]  Subjective: No new complaints. "Are you going to stay and talk with me - I need company". Feeling OK.  Objective: Vital signs in last 24 hours: Temp:  [97.7 F (36.5 C)-98.1 F (36.7 C)] 97.7 F (36.5 C) (08/08 0557) Pulse Rate:  [69-70] 70 (08/07 2127) Resp:  [18] 18 (08/07 1423) BP: (118-159)/(60-71) 118/60 mmHg (08/08 0557) SpO2:  [95 %-100 %] 100 % (08/08 0557) Weight change:  Last BM Date: 07/27/14  Intake/Output from previous day: 08/07 0701 - 08/08 0700 In: 1320 [P.O.:1320] Out: -   Physical Exam General: No apparent distress  Sitting In WC in room, self propelled Lungs: Normal effort. Lungs clear to auscultation, no crackles or wheezes. Cardiovascular: Regular rate and rhythm, no edema   Lab Results: BMET    Component Value Date/Time   NA 146 07/27/2014 0540   K 4.0 07/27/2014 0540   CL 103 07/27/2014 0540   CO2 27 07/27/2014 0540   GLUCOSE 83 07/27/2014 0540   BUN 12 07/27/2014 0540   CREATININE 0.78 07/27/2014 0540   CREATININE 0.65 05/27/2013 1706   CALCIUM 9.0 07/27/2014 0540   GFRNONAA 84* 07/27/2014 0540   GFRAA >90 07/27/2014 0540   CBC    Component Value Date/Time   WBC 6.3 07/27/2014 0540   WBC 6.1 07/08/2014 1221   RBC 3.86* 07/27/2014 0540   RBC 3.56* 07/25/2014 0505   RBC 4.13 07/08/2014 1221   HGB 11.4* 07/27/2014 0540   HGB 12.8 07/08/2014 1221   HCT 36.0 07/27/2014 0540   HCT 38.6 07/08/2014 1221   PLT PLATELET CLUMPS NOTED ON SMEAR, COUNT APPEARS ADEQUATE 07/27/2014 0540   PLT 226 07/08/2014 1221   MCV 93.3 07/27/2014 0540   MCV 94 07/08/2014 1221   MCH 29.5 07/27/2014 0540   MCH 31.0 07/08/2014 1221   MCHC 31.7 07/27/2014 0540   MCHC 33.2 07/08/2014 1221   RDW 15.7* 07/27/2014 0540   RDW 12.3 07/08/2014 1221   LYMPHSABS 1.3 07/27/2014 0540   LYMPHSABS 1.5 07/08/2014 1221   MONOABS 0.6 07/27/2014 0540   EOSABS 0.2 07/27/2014 0540   EOSABS 0.1 07/08/2014 1221   BASOSABS 0.1 07/27/2014 0540   BASOSABS 0.0 07/08/2014 1221    CBG's (last 3):  No results found for this basename: GLUCAP,  in the last 72 hours LFT's Lab Results  Component Value Date   ALT 16 07/27/2014   AST 20 07/27/2014   ALKPHOS 68 07/27/2014   BILITOT 0.3 07/27/2014    Studies/Results: No results found.  Medications:  I have reviewed the patient's current medications. Scheduled Medications: . amitriptyline  100 mg Oral QHS  . atorvastatin  20 mg Oral q1800  . folic acid  2 mg Oral Daily  . hydrocerin   Topical BID  . lidocaine  1 patch Transdermal Q24H  . nebivolol  5 mg Oral Daily  . pantoprazole  40 mg Oral Q0600  . PARoxetine  20 mg Oral Daily  . polyethylene glycol  17 g Oral BID  . pregabalin  200 mg Oral Daily  . pregabalin  400 mg Oral QHS  . senna-docusate  2 tablet Oral BID  . traZODone  100-200 mg Oral QHS   PRN Medications: albuterol, ALPRAZolam, alum & mag hydroxide-simeth, bisacodyl, diphenhydrAMINE, guaiFENesin-dextromethorphan, nitroGLYCERIN, ondansetron (ZOFRAN) IV, ondansetron, oxyCODONE-acetaminophen, traMADol  Assessment/Plan: Active Problems:   Multifactorial gait disorder 1. Functional deficits secondary to Gait disorder due to multiple medical and psychosocial issues---most notably  her GI blood loss and loss of husband.  2. DVT Prophylaxis/Anticoagulation: SCD's, ambulation  3. Back pain/fibromyalgia/Pain Management: Will continue percocet prn. Continue lidocaine patch, Lyrica as well as elavil. Off savella.  -emphasized good posture, OOB as well  4. Depression with anxiety/Grief reaction/Mood: Continue paxil with xanax prn. Team to provide ego support. LCSW and Neuropsych to follow for support  5. Neuropsych: This patient is capable of making decisions on her own behalf.  6. Skin/Wound Care: Routine pressure relief measures. Added nutritional supplement.  7. Gastritis: Continue PPI. Hgb stable. Continue ASA.  8. Acute on chronic anemia: Iron supplemented and to follow up with hem/onc past discharge.  Continue folic acid.  9. Dizziness: Check orthostatic blood pressures. TED and binder to help with symptoms. H/o BPPV in the past.  10 Right petrous bone mass: will need follow up MRI for work up.  11. Chronic insomnia: CPAP not functional---will need to follow up with pulmonary to reschedule study. Continue trazodone 100-200mg  qhs per home routine  12. Non-obst CAD with angina: continue ASA with bystolic daily. Off imdur d/c due to orthostatic changes. Monitor for symptoms with increase in activity.  69. [name] with asthma: Oxygen dependent. Albuterol prn.    Length of stay, days: 4    Valerie A. Asa Lente, MD 07/30/2014, 9:01 AM

## 2014-07-31 ENCOUNTER — Inpatient Hospital Stay (HOSPITAL_COMMUNITY): Payer: Medicare Other | Admitting: *Deleted

## 2014-07-31 ENCOUNTER — Inpatient Hospital Stay (HOSPITAL_COMMUNITY): Payer: Medicare Other | Admitting: Physical Therapy

## 2014-07-31 LAB — URINALYSIS, ROUTINE W REFLEX MICROSCOPIC
BILIRUBIN URINE: NEGATIVE
GLUCOSE, UA: NEGATIVE mg/dL
Ketones, ur: NEGATIVE mg/dL
Nitrite: NEGATIVE
PH: 5.5 (ref 5.0–8.0)
Protein, ur: NEGATIVE mg/dL
SPECIFIC GRAVITY, URINE: 1.023 (ref 1.005–1.030)
Urobilinogen, UA: 0.2 mg/dL (ref 0.0–1.0)

## 2014-07-31 LAB — URINE MICROSCOPIC-ADD ON

## 2014-07-31 NOTE — Progress Notes (Signed)
Linda [name] is a 68 y.o. female 18-Jan-1946 [id number]  Subjective: No new complaints. Sleep better last PM (taking meds here like at home). Feeling OK.  Objective: Vital signs in last 24 hours: Temp:  [97.8 F (36.6 C)-98 F (36.7 C)] 98 F (36.7 C) (08/09 0546) Pulse Rate:  [66-73] 73 (08/08 2143) Resp:  [18] 18 (08/08 2143) BP: (109-161)/(53-74) 161/74 mmHg (08/08 2143) SpO2:  [98 %-100 %] 98 % (08/09 0546) Weight:  [75.1 kg (165 lb 9.1 oz)] 75.1 kg (165 lb 9.1 oz) (08/09 0500) Weight change:  Last BM Date: 07/30/14  Intake/Output from previous day: 08/08 0701 - 08/09 0700 In: 1080 [P.O.:1080] Out: -   Physical Exam General: No apparent distress  Sitting in bed with breakfast tray.  Lungs: Normal effort. Lungs clear to auscultation, no crackles or wheezes. Cardiovascular: Regular rate and rhythm, no edema   Lab Results: BMET    Component Value Date/Time   NA 146 07/27/2014 0540   K 4.0 07/27/2014 0540   CL 103 07/27/2014 0540   CO2 27 07/27/2014 0540   GLUCOSE 83 07/27/2014 0540   BUN 12 07/27/2014 0540   CREATININE 0.78 07/27/2014 0540   CREATININE 0.65 05/27/2013 1706   CALCIUM 9.0 07/27/2014 0540   GFRNONAA 84* 07/27/2014 0540   GFRAA >90 07/27/2014 0540   CBC    Component Value Date/Time   WBC 6.3 07/27/2014 0540   WBC 6.1 07/08/2014 1221   RBC 3.86* 07/27/2014 0540   RBC 3.56* 07/25/2014 0505   RBC 4.13 07/08/2014 1221   HGB 11.4* 07/27/2014 0540   HGB 12.8 07/08/2014 1221   HCT 36.0 07/27/2014 0540   HCT 38.6 07/08/2014 1221   PLT PLATELET CLUMPS NOTED ON SMEAR, COUNT APPEARS ADEQUATE 07/27/2014 0540   PLT 226 07/08/2014 1221   MCV 93.3 07/27/2014 0540   MCV 94 07/08/2014 1221   MCH 29.5 07/27/2014 0540   MCH 31.0 07/08/2014 1221   MCHC 31.7 07/27/2014 0540   MCHC 33.2 07/08/2014 1221   RDW 15.7* 07/27/2014 0540   RDW 12.3 07/08/2014 1221   LYMPHSABS 1.3 07/27/2014 0540   LYMPHSABS 1.5 07/08/2014 1221   MONOABS 0.6 07/27/2014 0540   EOSABS 0.2 07/27/2014 0540   EOSABS 0.1 07/08/2014 1221    BASOSABS 0.1 07/27/2014 0540   BASOSABS 0.0 07/08/2014 1221   CBG's (last 3):  No results found for this basename: GLUCAP,  in the last 72 hours LFT's Lab Results  Component Value Date   ALT 16 07/27/2014   AST 20 07/27/2014   ALKPHOS 68 07/27/2014   BILITOT 0.3 07/27/2014    Studies/Results: No results found.  Medications:  I have reviewed the patient's current medications. Scheduled Medications: . amitriptyline  100 mg Oral QHS  . atorvastatin  20 mg Oral q1800  . folic acid  2 mg Oral Daily  . hydrocerin   Topical BID  . lidocaine  1 patch Transdermal Q24H  . nebivolol  5 mg Oral Daily  . pantoprazole  40 mg Oral Q0600  . PARoxetine  20 mg Oral Daily  . polyethylene glycol  17 g Oral BID  . pregabalin  200 mg Oral Daily  . pregabalin  400 mg Oral QHS  . senna-docusate  2 tablet Oral BID  . traZODone  100-200 mg Oral QHS   PRN Medications: albuterol, ALPRAZolam, alum & mag hydroxide-simeth, bisacodyl, diphenhydrAMINE, guaiFENesin-dextromethorphan, nitroGLYCERIN, ondansetron (ZOFRAN) IV, ondansetron, oxyCODONE-acetaminophen, traMADol  Assessment/Plan: Active Problems:   Multifactorial gait disorder 1. Functional  deficits secondary to Gait disorder due to multiple medical and psychosocial issues---most notably her GI blood loss and loss of husband.  2. DVT Prophylaxis/Anticoagulation: SCD's, ambulation  3. Back pain/fibromyalgia/Pain Management: on percocet prn. Continue lidocaine patch, Lyrica and elavil. Off savella.  -emphasized good posture, OOB as well  4. Depression with anxiety/Grief reaction/Mood: Continue paxil with xanax prn. Team to provide ego support. LCSW and Neuropsych to follow for support  5. Neuropsych: This patient is capable of making decisions on her own behalf.  6. Skin/Wound Care: Routine pressure relief measures. Added nutritional supplement.  7. Gastritis: Continue PPI. Hgb stable. Continue ASA.  8. Acute on chronic anemia: Iron supplemented and to  follow up with hem/onc past discharge. Continue folic acid.  9. Dizziness: Check orthostatic blood pressures. TED and binder to help with symptoms. H/o BPPV in the past.  10 Right petrous bone mass: will need follow up MRI for work up.  11. Chronic insomnia: CPAP not functional---will need to follow up with pulmonary to reschedule study. Continue trazodone 100-200mg  qhs per home routine  12. Non-obst CAD with angina: continue ASA with bystolic daily. Off imdur d/c due to orthostatic changes. Monitor for symptoms with increase in activity.  65. [name] with asthma: Oxygen dependent. Albuterol prn.    Length of stay, days: 5    Valerie A. Asa Lente, MD 07/31/2014, 8:28 AM

## 2014-07-31 NOTE — Progress Notes (Signed)
Occupational Therapy Session Note  Patient Details  Name: Linda [name] MRN: [id number] Date of Birth: 1946-08-22  Today's Date: 07/31/2014 Time:Time Calculation (min): 75 min Time: 5726-2035 (75 min)  Pain:7/10 back. Pt has history of back pain due to DDD and OA  Individual session   Short Term Goals: Week 1:  OT Short Term Goal 1 (Week 1): Short Term Goals = Long Term Goal secondary to ELOS Week 2:     Skilled Therapeutic Interventions/Progress Updates:    BP supine 144/68;  Sitting EOB=  116/51:  sitting 5 mins later= 117/70>  Addressed functional mobility with RW.  Family present with RW behind as safety.  Ambulated 15 feet 4 times.  Pt. Had one syncope episode.  Family brought wc for pt to sit.  BP= 138/69; 102/56,  122/6,  110/81.  Engaged in UE AROM using ball.  Pt. Needed minimal facilitation for upright posture.  She tended to lean posterior during activity.  Propelled wc back to room.   Ambulated to toilet with RW and transferred to toilet with minimal assist.  Ambulated back out to wc.     Left  pt in room with family present.  Therapy Documentation Precautions:  Precautions Precautions: Fall Precaution Comments: 3 L/min O2 with activity Required Braces or Orthoses: Other Brace/Splint Other Brace/Splint: abdominal binder Restrictions Weight Bearing Restrictions: No    Vital Signs: Therapy Vitals Temp: 98.1 F (36.7 C) Temp src: Oral Pulse Rate: 73 Resp: 18 BP: 112/71 mmHg Patient Position (if appropriate): Lying Oxygen Therapy SpO2: 98 % O2 Device: None (Room air) Pain: Pain Assessment Pain Assessment: 0-10 Pain Score:7 Faces Pain Scale: Hurts a little bit Pain Type: Chronic pain Pain Location: Back Pain Orientation: Right;Lower Pain Descriptors / Indicators: Aching Pain Onset: Sudden Pain Intervention(s): Medication (See eMAR)      See FIM for current functional status  Therapy/Group: Individual Therapy  Sanayah, Munro 07/31/2014, 3:18  PM

## 2014-07-31 NOTE — Progress Notes (Signed)
Physical Therapy Session Note  Patient Details  Name: Linda [name] MRN: [id number] Date of Birth: 02-22-1946  Today's Date: 07/31/2014 Time: 1300-1330 Time Calculation (min): 30 min  Short Term Goals: Week 1:  PT Short Term Goal 1 (Week 1): STG=LTGs  Skilled Therapeutic Interventions/Progress Updates:  Pt was seen bedside in the pm. Pt propelled w/c about 150 feet with B UEs and S. Pt transferred sit to stand x 3 with rolling walker and S to min guard. Pt c/o dizziness upon standing. BP in sitting 121/76 with heart rate 75, BP in standing 120/73 with heart rate 75. Pt ambulated with rolling walker and min guard to min A about 30 feet, pt c/o dizziness at end of ambulation. Pt propelled w/c back to room with B UEs and S. Pt transferred w/c to edge of bed with min A. Pt transferred edge of bed to supine with S.   Therapy Documentation Precautions:  Precautions Precautions: Fall Precaution Comments: 3 L/min O2 with activity Required Braces or Orthoses: Other Brace/Splint Other Brace/Splint: abdominal binder Restrictions Weight Bearing Restrictions: No General:   Pain: Pt c/o 7/10 low back and R LE pain.   Mobility:   Locomotion : Ambulation Ambulation/Gait Assistance: 4: Min guard;4: Min assist   See FIM for current functional status  Therapy/Group: Individual Therapy  Dub Amis 07/31/2014, 2:00 PM

## 2014-08-01 ENCOUNTER — Encounter (HOSPITAL_COMMUNITY): Payer: Medicare Other | Admitting: Occupational Therapy

## 2014-08-01 ENCOUNTER — Inpatient Hospital Stay (HOSPITAL_COMMUNITY): Payer: Medicare Other

## 2014-08-01 DIAGNOSIS — IMO0001 Reserved for inherently not codable concepts without codable children: Secondary | ICD-10-CM

## 2014-08-01 DIAGNOSIS — R5381 Other malaise: Secondary | ICD-10-CM

## 2014-08-01 DIAGNOSIS — F341 Dysthymic disorder: Secondary | ICD-10-CM

## 2014-08-01 DIAGNOSIS — Z5189 Encounter for other specified aftercare: Secondary | ICD-10-CM

## 2014-08-01 DIAGNOSIS — D62 Acute posthemorrhagic anemia: Secondary | ICD-10-CM

## 2014-08-01 DIAGNOSIS — K922 Gastrointestinal hemorrhage, unspecified: Secondary | ICD-10-CM

## 2014-08-01 NOTE — Progress Notes (Signed)
Physical Therapy Session Note  Patient Details  Name: Linda [name] MRN: [id number] Date of Birth: 24-May-1946  Today's Date: 08/01/2014 Time: 0900-1000 &1340-1440 Time Calculation (min): 60 min & 60 min  Short Term Goals: Week 1:  PT Short Term Goal 1 (Week 1): STG=LTGs  Skilled Therapeutic Interventions/Progress Updates:  AM Session (60 min): Pt. Received with complaints of multiple pain sights: see below. Pt. Indicated she felt drained and vitals checked: see below. Pt. Describes feeling weak and tired from OT. Pt. Activity tolerance for sit<>stand is poor and required multiple attempts with no ambulation occurring. Pt. On 3L/min O2 with nasal canula, abdominal binder and TED hose donned.  1:1 Treatment focused on activity tolerance and increased strength/endurance. Pt. Assist level is (S) due to orthostatic hypotension with RW use in ambulation, Pt. Mod I  with w/c mgmt.. Treatment consisted of multiple sit<>stand with RW and with HHA for increased functional activity tolerance, static balance, and monitoring for orthostasis. Nustep lvl 4 using BUE/BLEs for 15 minutes with several therapeutic rest recovery breaks. W/c propulsion with BUEs only for 150 feet x 2 with multiple therapeutic rest/recovery breaks. Pt. Performed donning/doffing of leg rests and proper positioning of w/c for transfers. Pt. Response limited by fatigue and orthostatic hypotension.  Pt. Condition post therapy session: positioned semi-reclined in bed with all needs within reach.   PM Session (60 min): Pt. Received pt. On toilet and transferred to w/c to start therapy session. Pt. Agreeable to session; States she wants to make more progress and wants to push through syncope for this session to walk. Pt. On 3L/min O2 with nasal canula, abdominal binder and TED hose donned.  1:1 Treatment focused on functional transfers and gait. Pt. Assist level is min guard to min A  with RW. Pt. requires max A if orthostatic  hypotension occurs; w/c follow during treatment for safety. Pt. Ambulated 4', 30', 72', and 120' with syncope occuring during each trial. Pt. Coached on providing verbal warning and assessing her own ability during ambulation to maintain safety. Pt. Coached on vision loss via dark spots increasing to limited or loss of visual field as precursor to syncope. Pt. Required multiple therapeutic rest/recovery breaks during trials.  Pt. Indicates increased fatigue and cannot ambulate during last 5 minutes of session; propels herself using BUEs gym>room with mod A from therapist.  Pain: unrated; "it's the same, it never changes"  Pt. Condition post therapy session: positioned in room recliner with feet elevated with all needs within reach.   Therapy Documentation Precautions:  Precautions Precautions: Fall Precaution Comments: 3 L/min O2 with activity Required Braces or Orthoses: Other Brace/Splint Other Brace/Splint: abdominal binder Restrictions Weight Bearing Restrictions: No Vital Signs: Therapy Vitals Temp: 98 F (36.7 C) Temp src: Oral Pulse Rate: 72 Resp: 17 BP: 125/56 mmHg Patient Position (if appropriate): Sitting Oxygen Therapy SpO2: 97 % O2 Device: Nasal cannula O2 Flow Rate (L/min): 3 L/min Pulse Oximetry Type: Intermittent Pain: Pain Assessment Pain Assessment: 0-10 Pain Score: 8  Pain Type: Chronic pain Pain Location: Back Pain Orientation: Right;Left Pain Descriptors / Indicators: Aching;Nagging;Sharp Pain Onset: On-going Patients Stated Pain Goal: 2 Pain Intervention(s): RN made aware Multiple Pain Sites: Yes 2nd Pain Site Pain Score: 7 Pain Type: Acute pain Pain Location: Hip Pain Orientation: Right Pain Descriptors / Indicators: Radiating;Cramping;Shooting;Pins and needles Pain Frequency: Intermittent Patient's Stated Pain Goal: 2 Pain Intervention(s): RN made aware 3rd Pain Site Pain Score: 7 Pain Type: Acute pain Pain Location: Leg Pain  Orientation: Right Pain  Radiating Towards: distally Pain Descriptors / Indicators: Penetrating;Aching;Shooting Pain Frequency: Intermittent Patient's Stated Pain Goal: 2 Pain Intervention(s): RN made aware Mobility:   Locomotion : Ambulation Ambulation/Gait Assistance: 4: Min guard;4: Min Financial controller Distance: 150   See FIM for current functional status  Therapy/Group: Individual Therapy  Juluis Mire 08/01/2014, 5:35 PM

## 2014-08-01 NOTE — Progress Notes (Signed)
[facility] PHYSICAL MEDICINE & REHABILITATION     PROGRESS NOTE    Subjective/Complaints: Pt states she has been dizzy in therapy, 8/8 ortho vitals showed drop from lying to sitting but not sit to stand Review of Systems - Negative except dizziness Objective: Vital Signs: Blood pressure 167/82, pulse 79, temperature 98 F (36.7 C), temperature source Oral, resp. rate 18, weight 76.2 kg (167 lb 15.9 oz), SpO2 98.00%. No results found. No results found for this basename: WBC, HGB, HCT, PLT,  in the last 72 hours No results found for this basename: NA, K, CL, CO, GLUCOSE, BUN, CREATININE, CALCIUM,  in the last 72 hours CBG (last 3)  No results found for this basename: GLUCAP,  in the last 72 hours  Wt Readings from Last 3 Encounters:  08/01/14 76.2 kg (167 lb 15.9 oz)  07/26/14 62.1 kg (136 lb 14.5 oz)  07/26/14 62.1 kg (136 lb 14.5 oz)    Physical Exam:  Constitutional: She is oriented to person, place, and time. She appears well-developed and well-nourished.  No distress HENT: oral mucosa pink, dentition good  Head: Normocephalic and atraumatic.  Eyes: Conjunctivae are normal. Pupils are equal, round, and reactive to light.  Neck: Normal range of motion. Neck supple.  Cardiovascular: Normal rate and regular rhythm. No murmurs  Respiratory: Effort normal and breath sounds normal. No respiratory distress. She has no wheezes.  GI: Soft. Bowel sounds are normal. She exhibits no distension. There is no tenderness.  Neurological: She is alert and oriented to person, place, and time. Mild sensory loss over right foot and ulnar hand is persistent---otherwise normal sensory exam. Strength grossly 4+/5 in bilateral UE's. LE's 4/5 HF and Knees, 4/5 ankles.. Normal rom and fmc. Cognitively displays good insight and awareness.  Skin: Skin is warm and dry.  Psychiatric: Her speech is normal and behavior is normal. Judgment normal. Her mood is slightly anxious. She is not slowed and not  withdrawn. Cognition and memory are normal.  Mentioned loss of husband couple of time during conversation   Assessment/Plan: 1. Functional deficits secondary to gait disorder/deconditioning which require 3+ hours per day of interdisciplinary therapy in a comprehensive inpatient rehab setting. Physiatrist is providing close team supervision and 24 hour management of active medical problems listed below. Physiatrist and rehab team continue to assess barriers to discharge/monitor patient progress toward functional and medical goals. FIM: FIM - Bathing Bathing Steps Patient Completed: Chest;Right Arm;Left Arm;Abdomen;Front perineal area;Buttocks;Right upper leg;Left upper leg;Right lower leg (including foot);Left lower leg (including foot) Bathing: 5: Supervision: Safety issues/verbal cues  FIM - Upper Body Dressing/Undressing Upper body dressing/undressing steps patient completed: Thread/unthread right bra strap;Thread/unthread left bra strap;Hook/unhook bra;Thread/unthread right sleeve of pullover shirt/dresss;Thread/unthread left sleeve of pullover shirt/dress;Pull shirt over trunk;Put head through opening of pull over shirt/dress Upper body dressing/undressing: 5: Set-up assist to: Obtain clothing/put away FIM - Lower Body Dressing/Undressing Lower body dressing/undressing steps patient completed: Thread/unthread right pants leg;Thread/unthread left pants leg;Pull pants up/down;Don/Doff right sock;Don/Doff left sock;Fasten/unfasten pants;Thread/unthread right underwear leg;Thread/unthread left underwear leg;Pull underwear up/down Lower body dressing/undressing: 4: Steadying Assist  FIM - Toileting Toileting steps completed by patient: Adjust clothing prior to toileting;Performs perineal hygiene;Adjust clothing after toileting Toileting: 4: Steadying assist  FIM - Radio producer Devices: Grab bars Toilet Transfers: 4-To toilet/BSC: Min A (steadying Pt. >  75%);4-From toilet/BSC: Min A (steadying Pt. > 75%)  FIM - Bed/Chair Transfer Bed/Chair Transfer Assistive Devices: Copy: 5: Supine > Sit: Supervision (verbal cues/safety issues);4:  Chair or W/C > Bed: Min A (steadying Pt. > 75%)  FIM - Locomotion: Wheelchair Distance: 150 Locomotion: Wheelchair: 5: Travels 150 ft or more: maneuvers on rugs and over door sills with supervision, cueing or coaxing FIM - Locomotion: Ambulation Locomotion: Ambulation Assistive Devices: Administrator Ambulation/Gait Assistance: 4: Min guard;4: Min assist Locomotion: Ambulation: 1: Travels less than 50 ft with minimal assistance (Pt.>75%)  Comprehension Comprehension Mode: Auditory Comprehension: 6-Follows complex conversation/direction: With extra time/assistive device  Expression Expression Mode: Verbal Expression: 6-Expresses complex ideas: With extra time/assistive device  Social Interaction Social Interaction: 6-Interacts appropriately with others with medication or extra time (anti-anxiety, antidepressant).  Problem Solving Problem Solving: 5-Solves complex 90% of the time/cues < 10% of the time  Memory Memory: 6-More than reasonable amt of time  Medical Problem List and Plan:  1. Functional deficits secondary to Gait disorder due to multiple medical and psychosocial issues---most notably her GI blood loss and loss of husband.  2. DVT Prophylaxis/Anticoagulation: SCD's, ambulation 3. Back pain/fibromyalgia/Pain Management: Will continue percocet prn. Continue lidocaine patch, Lyrica as well as elavil.   Off savella.  4. Depression with anxiety/Grief reaction/Mood: Continue paxil with xanax prn. Team to provide ego support. Will have LCSW follow for support and evaluation. Neuropsych to follow up for support  5. Neuropsych: This patient is capable of making decisions on her own behalf.  6. Skin/Wound Care: Routine pressure relief measures. Added nutritional supplement.  7.  Gastritis: Continue PPI. Serial check of H/H. Off  ASA. GI to determine restart date 8. Acute on chronic anemia: Iron supplemented and to follow up with hem/onc past discharge. Continue folic acid.  9. Dizziness: Check orthostatic blood pressures.   TED and binder to help with symptoms. H/o BPPV in the past.  10 Right petrous bone mass: will need follow up MRI for work up.  11. Chronic insomnia: CPAP not functional---will need to follow up with pulmonary to reschedule study. Continue trazodone at bedtime. --increased to 100-200mg  qhs per home routine 12. Non-obst CAD with angina: continue ASA with bystolic daily. Off imdur d/c due to orthostatic changes. Monitor for symptoms with increase in activity.  7. [name] with asthma: Oxygen dependent. Albuterol  prn.   LOS (Days) 6 A FACE TO FACE EVALUATION WAS PERFORMED  KIRSTEINS,ANDREW E 08/01/2014 8:21 AM

## 2014-08-01 NOTE — Progress Notes (Addendum)
Discussed patient's medical issues with son. He is upset about ongoing orthostatic changes as well as her need for pain medications. He indicates that this has been going on for years and feels narcotics are cause of it.  Discussed medication changes that are ongoing as well as her chronic pain issues due to DDD/fibromyalgia and depression due to loss of husband/move from [facility].  He want's medication stopped...."I have medical power of attorney".  Discussed in room with patient who is aware of son's wishes. Note that patient engaging in therapy without oxygen which is likely causing hypoxia leading to to orthostatic changes. Need for oxygen clarified.

## 2014-08-01 NOTE — Progress Notes (Signed)
Occupational Therapy Session Note  Patient Details  Name: Linda [name] MRN: [id number] Date of Birth: Nov 22, 1946  Today's Date: 08/01/2014 Time: 0730-0832 Time Calculation (min): 62 min  Short Term Goals: Week 1:  OT Short Term Goal 1 (Week 1): Short Term Goals = Long Term Goal secondary to ELOS  Skilled Therapeutic Interventions/Progress Updates:  Patient received supine in bed with "the usual pain" in back; 5/10 - RN aware. Patient engaged in bed mobility and sat EOB without abdominal binder or TEDs donned, BP=120/62, 02sats=100% on room air, HR=74. Patient stood with RW and ambulated into BR. Vitals checked again; BP=113/76, 02sats=97% on room air, and HR=77. Patient doffed clothes independently, then performed UB/LB bathing in sit<>stand position. Patient dried in sit<>stand position and ambulated > w/c. Patient sat and BP=117/74, 02 sats ranged from 85-90% on room air. Therapist donned 3 liters of 02 via [place]. Patient with increased orthostatic symptoms this session. Therapist assisted with donning of bilateral TEDs and abdominal binder. Patient sat at sink for grooming task of brushing teeth. Therapist set-up patient beside bed and checked vitals; BP=145/66, 02 sats on 3 liters of 02 via [place]=98, HR=72. At end of session, left patient seated in w/c with all needs within reach and NT present.   *Downgraded OT goals to an overall supervision level secondary to patient's orthostatic symptoms and patient unsteady during functional ambulation & dynamic standing; SW aware of downgraded goals and patient reaching goals.   Precautions:  Precautions Precautions: Fall Precaution Comments: 3 L/min O2 with activity Required Braces or Orthoses: Other Brace/Splint Other Brace/Splint: abdominal binder Restrictions Weight Bearing Restrictions: No  See FIM for current functional status  Therapy/Group: Individual Therapy  Jona Zappone 08/01/2014, 8:45 AM

## 2014-08-01 NOTE — Progress Notes (Signed)
Linda [name] is lying in bed with her lunch tray at her side today. She is telling me about an interaction between her son and staff her in [place] and is a little flustered about this. She says her son was unhappy with her lack of progress. I asked her how she felt things were going here in [facility] and she says that she has been happy with the care and although she has not progressed as far as she would like she has progressed.   Offered her emotional support. I encouraged her to speak up and use her voice. I also encouraged her to make sure she is taking goo care of herself and reminded her that she has to do what is best for herself. She is the one that has to go back home by herself and this has to be safe. I offered listening and support. I will continue to follow and support.   Vinie Sill, NP Palliative Medicine Team Pager # [phone] (M-F 8a-5p) Team Phone # [phone] (Nights/Weekends)

## 2014-08-02 ENCOUNTER — Inpatient Hospital Stay (HOSPITAL_COMMUNITY): Payer: Medicare Other

## 2014-08-02 ENCOUNTER — Inpatient Hospital Stay (HOSPITAL_COMMUNITY): Payer: Medicare Other | Admitting: *Deleted

## 2014-08-02 ENCOUNTER — Encounter (HOSPITAL_COMMUNITY): Payer: Medicare Other

## 2014-08-02 DIAGNOSIS — R5381 Other malaise: Secondary | ICD-10-CM

## 2014-08-02 DIAGNOSIS — Z5189 Encounter for other specified aftercare: Secondary | ICD-10-CM

## 2014-08-02 DIAGNOSIS — F341 Dysthymic disorder: Secondary | ICD-10-CM

## 2014-08-02 DIAGNOSIS — D62 Acute posthemorrhagic anemia: Secondary | ICD-10-CM

## 2014-08-02 DIAGNOSIS — K922 Gastrointestinal hemorrhage, unspecified: Secondary | ICD-10-CM

## 2014-08-02 DIAGNOSIS — IMO0001 Reserved for inherently not codable concepts without codable children: Secondary | ICD-10-CM

## 2014-08-02 LAB — CBC
HCT: 34.7 % — ABNORMAL LOW (ref 36.0–46.0)
HEMOGLOBIN: 10.9 g/dL — AB (ref 12.0–15.0)
MCH: 29.8 pg (ref 26.0–34.0)
MCHC: 31.4 g/dL (ref 30.0–36.0)
MCV: 94.8 fL (ref 78.0–100.0)
Platelets: 164 10*3/uL (ref 150–400)
RBC: 3.66 MIL/uL — ABNORMAL LOW (ref 3.87–5.11)
RDW: 14.7 % (ref 11.5–15.5)
WBC: 6.7 10*3/uL (ref 4.0–10.5)

## 2014-08-02 LAB — BASIC METABOLIC PANEL
Anion gap: 15 (ref 5–15)
BUN: 14 mg/dL (ref 6–23)
CHLORIDE: 99 meq/L (ref 96–112)
CO2: 26 mEq/L (ref 19–32)
Calcium: 9 mg/dL (ref 8.4–10.5)
Creatinine, Ser: 0.68 mg/dL (ref 0.50–1.10)
GFR calc Af Amer: >90 mL/min (ref >90)
GFR calc non Af Amer: 88 mL/min — ABNORMAL LOW (ref >90)
GLUCOSE: 121 mg/dL — AB (ref 70–99)
POTASSIUM: 3.7 meq/L (ref 3.7–5.3)
Sodium: 140 mEq/L (ref 137–147)

## 2014-08-02 MED ORDER — OXYCODONE-ACETAMINOPHEN 5-325 MG PO TABS
2.0000 | ORAL_TABLET | Freq: Three times a day (TID) | ORAL | Status: DC | PRN
  Administered 2014-08-02 – 2014-08-05 (×7): 2 via ORAL
  Filled 2014-08-02 (×8): qty 2

## 2014-08-02 MED ORDER — AMITRIPTYLINE HCL 75 MG PO TABS
75.0000 mg | ORAL_TABLET | Freq: Every day | ORAL | Status: DC
  Administered 2014-08-02: 75 mg via ORAL
  Filled 2014-08-02 (×2): qty 1

## 2014-08-02 NOTE — Progress Notes (Addendum)
PHYSICAL MEDICINE & REHABILITATION     PROGRESS NOTE    Subjective/Complaints: Pt states she has been dizzy in therapy, 8/10 ortho vitals showed 64mmHg drop from lying to stand Discussed pain med regimen at home Used elavil 100mg  qhs Oxycodone 10mg  BID or TID Lyrica Savella Tramadol at hs Review of Systems - Negative except dizziness Objective: Vital Signs: Blood pressure 147/80, pulse 64, temperature 97.4 F (36.3 C), temperature source Oral, resp. rate 18, weight 77.1 kg (169 lb 15.6 oz), SpO2 99.00%. No results found. No results found for this basename: WBC, HGB, HCT, PLT,  in the last 72 hours No results found for this basename: NA, K, CL, CO, GLUCOSE, BUN, CREATININE, CALCIUM,  in the last 72 hours CBG (last 3)  No results found for this basename: GLUCAP,  in the last 72 hours  Wt Readings from Last 3 Encounters:  08/02/14 77.1 kg (169 lb 15.6 oz)  07/26/14 62.1 kg (136 lb 14.5 oz)  07/26/14 62.1 kg (136 lb 14.5 oz)    Physical Exam:  Constitutional: She is oriented to person, place, and time. She appears well-developed and well-nourished.  No distress HENT: oral mucosa pink, dentition good  Head: Normocephalic and atraumatic.  Eyes: Conjunctivae are normal. Pupils are equal, round, and reactive to light.  Neck: Normal range of motion. Neck supple.  Cardiovascular: Normal rate and regular rhythm. No murmurs  Respiratory: Effort normal and breath sounds normal. No respiratory distress. She has no wheezes.  GI: Soft. Bowel sounds are normal. She exhibits no distension. There is no tenderness.  Neurological: She is alert and oriented to person, place, and time. Mild sensory loss over right foot and ulnar hand is persistent---otherwise normal sensory exam. Strength grossly 4+/5 in bilateral UE's. LE's 4/5 HF and Knees, 4/5 ankles.. Normal rom and fmc. Cognitively displays good insight and awareness.  Skin: Skin is warm and dry.  Psychiatric: Her speech is normal  and behavior is normal. Judgment normal. Her mood is slightly anxious. She is not slowed and not withdrawn. Cognition and memory are normal.  Mentioned loss of husband couple of time during conversation   Assessment/Plan: 1. Functional deficits secondary to gait disorder/deconditioning which require 3+ hours per day of interdisciplinary therapy in a comprehensive inpatient rehab setting. Physiatrist is providing close team supervision and 24 hour management of active medical problems listed below. Physiatrist and rehab team continue to assess barriers to discharge/monitor patient progress toward functional and medical goals. FIM: FIM - Bathing Bathing Steps Patient Completed: Chest;Right Arm;Left Arm;Abdomen;Front perineal area;Buttocks;Right upper leg;Left upper leg;Right lower leg (including foot);Left lower leg (including foot) Bathing: 5: Supervision: Safety issues/verbal cues  FIM - Upper Body Dressing/Undressing Upper body dressing/undressing steps patient completed: Thread/unthread right bra strap;Thread/unthread left bra strap;Hook/unhook bra;Thread/unthread right sleeve of pullover shirt/dresss;Thread/unthread left sleeve of pullover shirt/dress;Pull shirt over trunk;Put head through opening of pull over shirt/dress Upper body dressing/undressing: 5: Set-up assist to: Obtain clothing/put away FIM - Lower Body Dressing/Undressing Lower body dressing/undressing steps patient completed: Don/Doff right sock;Don/Doff left sock Lower body dressing/undressing: 5: Set-up assist to: Don/Doff TED stocking  FIM - Toileting Toileting steps completed by patient: Adjust clothing prior to toileting;Performs perineal hygiene;Adjust clothing after toileting Toileting: 0: Activity did not occur  FIM - Radio producer Devices: Grab bars Toilet Transfers: 0-Activity did not occur  FIM - Control and instrumentation engineer Devices: Environmental consultant;Bed rails Bed/Chair  Transfer: 5: Supine > Sit: Supervision (verbal cues/safety issues);4: Chair or W/C > Bed:  Min A (steadying Pt. > 75%)  FIM - Locomotion: Wheelchair Distance: 150 Locomotion: Wheelchair: 5: Travels 150 ft or more: maneuvers on rugs and over door sills with supervision, cueing or coaxing FIM - Locomotion: Ambulation Locomotion: Ambulation Assistive Devices: Administrator Ambulation/Gait Assistance: 4: Min guard;4: Min assist Locomotion: Ambulation: 2: Travels 50 - 149 ft with minimal assistance (Pt.>75%)  Comprehension Comprehension Mode: Auditory Comprehension: 6-Follows complex conversation/direction: With extra time/assistive device  Expression Expression Mode: Verbal Expression: 6-Expresses complex ideas: With extra time/assistive device  Social Interaction Social Interaction: 6-Interacts appropriately with others with medication or extra time (anti-anxiety, antidepressant).  Problem Solving Problem Solving: 5-Solves basic 90% of the time/requires cueing < 10% of the time  Memory Memory: 6-More than reasonable amt of time  Medical Problem List and Plan:  1. Functional deficits secondary to Gait disorder due to multiple medical and psychosocial issues---most notably her GI blood loss and loss of husband.  2. DVT Prophylaxis/Anticoagulation: SCD's, ambulation 3. Back pain/fibromyalgia/R>L Knee OA  Pain Management: Will change   percocet  To home dose Continue lidocaine patch, Lyrica , reduce elavil.   Off savella.  4. Depression with anxiety/Grief reaction/Mood: D/C paxil since she is on antidepressant doses of trazodone and elavil with xanax prn. Team to provide ego support. Will have LCSW follow for support and evaluation. Neuropsych to follow up for support  5. Neuropsych: This patient is capable of making decisions on her own behalf.  6. Skin/Wound Care: Routine pressure relief measures. Added nutritional supplement.  7. Gastritis: Continue PPI. Serial check of H/H. Off  ASA.  GI to determine restart date 8. Acute on chronic anemia: Iron supplemented and to follow up with hem/onc past discharge. Continue folic acid.  9. Dizziness: Check orthostatic blood pressures.  Reduce elavil, check BMET and CBC TED and binder to help with symptoms. H/o BPPV in the past.  10 Right petrous bone mass: will need follow up MRI for work up.  11. Chronic insomnia: CPAP not functional---will need to follow up with pulmonary to reschedule study. Continue trazodone at bedtime. --increased to 100-200mg  qhs per home routine 12. Non-obst CAD with angina: continue ASA with bystolic daily. Off imdur d/c due to orthostatic changes. Monitor for symptoms with increase in activity.  20. [name] with asthma: Oxygen dependent. Albuterol  prn.   LOS (Days) 7 A FACE TO FACE EVALUATION WAS PERFORMED  Charlett Blake 08/02/2014 9:21 AM

## 2014-08-02 NOTE — Progress Notes (Signed)
Physical Therapy Session Note  Patient Details  Name: Linda [name] MRN: [id number] Date of Birth: Nov 09, 1946  Today's Date: 08/02/2014 Time: 1607-3710 Time Calculation (min): 43 min  Short Term Goals: Week 1:  PT Short Term Goal 1 (Week 1): STG=LTGs  Skilled Therapeutic Interventions/Progress Updates:    Patient received sitting in recliner. Session focused on emergent awareness with all activities that with onset of visual blurriness/dizziness/lightheadedness, patient immediately needs to cease whatever activity is. Activities included functional transfers at min guard level, wheelchair mobility >150' x1 with supervision, NuStep Level 3 with B UE/LE, and functional ambulation around kitchen in ADL apartment with RW, reaching for various items from cabinets and drawers. Patient requires repeated questioning cues about how she is feeling, fatigue levels, and presence of symptoms. Several times, patient stating she has symptoms, but states "I can keep going." Patient requires max cues that when symptoms are present, activity must be stopped safely and emphasis on pursed lip breathing.   In kitchen, s/p ambulation/standing/reaching for approximately 2 -3 min, patient asked how she is feeling and response is delayed, patient appearing as if she may pass out, modA ambulation back towards wheelchair and then [place] lift to safely place patient back in wheelchair. Patient was unable to verbalize symptoms prior to episode and required sigificant time to regain breathing. Patient fatigued after episode and reports headache, asking to return room and thus, missing last 17 min of therapy. Patient left sitting in recliner with all needs within reach.  Therapy Documentation Precautions:  Precautions Precautions: Fall Precaution Comments: 3 L/min O2 with activity Required Braces or Orthoses: Other Brace/Splint Other Brace/Splint: abdominal binder Restrictions Weight Bearing Restrictions:  No General: Amount of Missed PT Time (min): 17 Minutes Missed Time Reason: Patient fatigue;Other (comment) (fatigue and headache after near passing out episode) Vital Signs: Oxygen Therapy SpO2: 99 % O2 Device: Nasal cannula O2 Flow Rate (L/min): 3 L/min Pulse Oximetry Type: Intermittent Pain: Pain Assessment Pain Assessment: No/denies pain Pain Score: 0-No pain Locomotion : Ambulation Ambulation/Gait Assistance: 4: Min guard;4: Min assist;3: Mod assist Wheelchair Mobility Distance: 150   See FIM for current functional status  Therapy/Group: Individual Therapy  Lillia Abed. Jasia Hiltunen, PT, DPT 08/02/2014, 12:14 PM

## 2014-08-02 NOTE — Progress Notes (Signed)
Linda [name] tells me that she has already completed 3 hours of therapy today and is pleased with her progress. She is concerned about her oxygen levels dropping into 80s during therapy. She is also requesting blood work follow up for her anemia. She talks about her family and about her husband today. She talks of what they enjoyed together and how she is happy to be close to family (son) now that her husband has passed. I continue to listen and allow her time to discuss her husband and how her life has changed since his death to encourage healing and grieving.   Linda Sill, NP Palliative Medicine Team Pager # [phone] (M-F 8a-5p) Team Phone # [phone] (Nights/Weekends)

## 2014-08-02 NOTE — Progress Notes (Signed)
Physical Therapy Session Note  Patient Details  Name: Linda [name] MRN: [id number] Date of Birth: [birth year]  Today's Date: 08/02/2014 Time: 0805-0905 Time Calculation (min): 60 min  Short Term Goals: Week 1:  PT Short Term Goal 1 (Week 1): STG=LTGs  Skilled Therapeutic Interventions/Progress Updates:  1:1. Pt received sitting in w/c, just completed eating breakfast and ready for therapy. Pt verbalized 7/10 LBP, RN made aware but pt unable to receive pain medicine at this time. Focus this session on activity tolerance, pursed lip breathing and emergent awareness of lightheadedness. Pt req steadying assist for toileting at start of session and SPT w/c<>toilet w/ use of grab bar, however, cues to take seated rest while pulling up pants due to repot of visual blurriness. When pt cued to stand back up to finish pulling up pants pt stating, "Oh! I guess I forgot what I was doing."  Strong emphasis this session on emergent awareness that with onset of visual blurriness/dizziness, pt immediately needed to verbalize need to sit down. Initial focus on static standing then progressing to playing game of tic-tac-toe in standing to target standing tolerance and emergent awareness. Pt able to stand 5x for approx 2-3 min before onset of dizziness, with pt appropriately sitting 1x w/out cue from therapist. BP in sitting recorded as 128/73, standing 140/68. Unable to obtain second BP in standing.   Pt req supervision for w/c propulsion 165'x2 w/ B UE, consistent cues for pursed lip breathing. Pt's SaO2>97% throughout session on 3L O2. Pt req min guard A for all SPT this session +/-RW tx mat<>w/c>recliner.   Pt left sitting in recliner at end of session w/ all needs in reach.   Therapy Documentation Precautions:  Precautions Precautions: Fall Precaution Comments: 3 L/min O2 with activity Required Braces or Orthoses: Other Brace/Splint Other Brace/Splint: abdominal binder Restrictions Weight Bearing  Restrictions: No    Pain: Pain Assessment Pain Assessment: 0-10 Pain Score: 7  Pain Type: Chronic pain Pain Location: Back Pain Orientation: Lower Pain Descriptors / Indicators: Aching Pain Intervention(s): RN made aware;Distraction  See FIM for current functional status  Therapy/Group: Individual Therapy  Gilmore Laroche 08/02/2014, 10:25 AM

## 2014-08-02 NOTE — Progress Notes (Signed)
Recreational Therapy Session Note  Patient Details  Name: DIANCA OWENSBY MRN: [id number] Date of Birth: 27-Dec-1945 Today's Date: 08/02/2014  Pain: no c/o Skilled Therapeutic Interventions/Progress Updates: Attempted eval completion, pt states difficulty participating in therapies.  Lengthy discussion about importance of recognizing symptoms of lightheadedness & taking seated rest breaks when symptoms begin.  Do not feel pt appropriate for TR services at this time.  Will continue to monitor through team. Arriyah Madej 08/02/2014, 3:16 PM

## 2014-08-02 NOTE — Progress Notes (Signed)
Occupational Therapy Session Note  Patient Details  Name: Linda [name] MRN: [id number] Date of Birth: 01-23-1946  Today's Date: 08/02/2014 Time: 8502-7741 Time Calculation (min): 60 min  Short Term Goals: Week 1:  OT Short Term Goal 1 (Week 1): Short Term Goals = Long Term Goal secondary to ELOS  Skilled Therapeutic Interventions/Progress Updates:    Pt resting in bed upon arrival.  Pt declined shower this morning and completed BADLs (bathing and dressing) with sit<>stand from w/c at sink.  Pt c/o slight dizziness when initially sitting EOB but quickly resolved.  Pt amb with RW from bed to sink for bathing and dressing tasks.  Pt completed all tasks at supervision level.  Pt removed O2 during session at which time O2 sats decreased to 87%.  Pt agreeable to replacing O2 via [place].  Pt assisted with donning TED hose and abdominal binder.  Focus on activity tolerance, functional amb with RW, dynamic standing balance, and safety awareness.  Therapy Documentation Precautions:  Precautions Precautions: Fall Precaution Comments: 3 L/min O2 with activity Required Braces or Orthoses: Other Brace/Splint Other Brace/Splint: abdominal binder Restrictions Weight Bearing Restrictions: No Pain: Pain Assessment Pain Assessment: 0-10 Pain Score: 7  Pain Type: Chronic pain Pain Location: Back Pain Orientation: Lower Pain Descriptors / Indicators: Aching Pain Frequency: Constant Pain Onset: Awakened from sleep Pain Intervention(s): RN made aware  See FIM for current functional status  Therapy/Group: Individual Therapy  Leroy Libman 08/02/2014, 7:58 AM

## 2014-08-03 ENCOUNTER — Encounter (HOSPITAL_COMMUNITY): Payer: Medicare Other

## 2014-08-03 ENCOUNTER — Inpatient Hospital Stay (HOSPITAL_COMMUNITY): Payer: Medicare Other | Admitting: Occupational Therapy

## 2014-08-03 ENCOUNTER — Inpatient Hospital Stay (HOSPITAL_COMMUNITY): Payer: Medicare Other | Admitting: Physical Therapy

## 2014-08-03 ENCOUNTER — Telehealth: Payer: Self-pay | Admitting: Hematology & Oncology

## 2014-08-03 ENCOUNTER — Inpatient Hospital Stay (HOSPITAL_COMMUNITY): Payer: Medicare Other

## 2014-08-03 LAB — URINE CULTURE

## 2014-08-03 MED ORDER — AMITRIPTYLINE HCL 50 MG PO TABS
50.0000 mg | ORAL_TABLET | Freq: Every day | ORAL | Status: DC
  Administered 2014-08-03: 50 mg via ORAL
  Filled 2014-08-03 (×2): qty 1

## 2014-08-03 NOTE — Telephone Encounter (Signed)
Linda [name] for [facility] resch 08/05/14 apt to 08/24/14

## 2014-08-03 NOTE — Progress Notes (Signed)
I continue to follow and offer listening to Ms. Geck. She is worried about going home and believes she can afford private caregivers for 24/7 care at home. She feels strongly that she needs to be at the home she shared with her husband because she feels like she feels his presence there.   Vinie Sill, NP Palliative Medicine Team Pager # [phone] (M-F 8a-5p) Team Phone # [phone] (Nights/Weekends)

## 2014-08-03 NOTE — Patient Care Conference (Signed)
Inpatient RehabilitationTeam Conference and Plan of Care Update Date: 08/02/2014   Time: 2:15 PM    Patient Name: Linda [name]      Medical Record Number: [id number]  Date of Birth: May 03, 1946 Sex: Female         Room/Bed: 4W19C/4W19C-01 Payor Info: Payor: MEDICARE / Plan: MEDICARE PART A AND B / Product Type: *No Product type* /    Admitting Diagnosis: Gait disorder  Admit Date/Time:  07/26/2014  3:06 PM Admission Comments: No comment available   Primary Diagnosis:  Multifactorial gait disorder Principal Problem: Multifactorial gait disorder  Patient Active Problem List   Diagnosis Date Noted  . Multifactorial gait disorder 07/26/2014  . Chronic respiratory failure 07/23/2014  . Epistaxis 07/18/2014  . Acute GI bleeding 07/17/2014  . Anemia associated with acute blood loss 07/17/2014  . Syncope 07/17/2014  . History of pulmonary embolism 07/17/2014  . GI bleed 07/17/2014  . Arthritis 05/11/2014  . DDD (degenerative disc disease) 05/11/2014  . Fibrositis 05/11/2014  . Amianthosis 05/11/2014  . Grief 04/28/2014  . OSA (obstructive sleep apnea) 04/24/2014  . Chronic diastolic CHF, NYHA class 1 04/24/2014  . Acute pulmonary embolism 08/13/2013  . Pleural plaque with presence of asbestos 07/22/2013  . Iron deficiency anemia, unspecified 06/28/2013  . Polypharmacy 04/26/2013  . Fibromyalgia syndrome 03/01/2013  . Insomnia 11/12/2012  . HTN (hypertension), benign 10/25/2012    Expected Discharge Date: Expected Discharge Date: 08/09/14  Team Members Present: Physician leading conference: Dr. Alger Simons Social Worker Present: Alfonse Alpers, LCSW Nurse Present: Nanine Means, RN PT Present: Raylene Everts, Cottie Banda, PT OT Present: Salome Spotted, OT SLP Present: Windell Moulding, SLP Other (Discipline and Name): Danne Baxter, RN [facility]) PPS Coordinator present : Daiva Nakayama, RN, CRRN     Current Status/Progress Goal Weekly Team Focus  Medical   orthostatic  changes with poor awareness  simplify med regimen  reduce meds which can cause orthostatsis   Bowel/Bladder   cont of bowel and bladder; has been refusing sched bowel meds but having regular BMs; LBM 8/10  remain cont of bowel and bladder  cont to monitor BMs, patient is taking narcotics   Swallow/Nutrition/ Hydration             ADL's   steady A/supervision overall; limited by orthostatic hypotension  supervision overall  activity tolerance, safety awareness   Mobility   Limited by syncope/orthostatic hypotension, min A overall w/ transfers and short distance amb w/ RW, poor emergent awareness regarding need to sit when experiencing lightheadedness; (S) for w/c propulsion  Downgraded, supervision overall  activity tolerance, self-awareness (syncope) with gait, pt/family education, balance, overall safety during standing mobility/transfers   Communication             Safety/Cognition/ Behavioral Observations            Pain   pain level up to 8/10, down to 6/10 after percocet 2 tabs for chronic back pain  pain less than 3  monitor pain, offer pain meds   Skin   red, dry patches to bilateral legs and arms; eucerin cream applied  no infection or skin breakdown  assess skin qshift and prn    Rehab Goals Patient on target to meet rehab goals: No Rehab Goals Revised: Pt's goals have been downgraded to supervision. *See Care Plan and progress notes for long and short-term goals.  Barriers to Discharge: poor awareness of deficits    Possible Resolutions to Barriers:  downgrade to sup  Discharge Planning/Teaching Needs:  Pt is trying to arrange 24/7 private duty care for herself at home.  Family or caregiver can receive family education as needed.   Team Discussion:  MD is trying to see if simplifying pt's medications will decreased the orthostatic episodes.  Pt is participating with therapists, but doesn't stop when she begins having symptoms.  Goals are being downgraded to  supervision as a result.  Pt needs to be able to self monitor when to sit down and she cannot due that yet due to safety awareness deficits.  Pt is continent.    Revisions to Treatment Plan:  Goals downgraded to supervision   Continued Need for Acute Rehabilitation Level of Care: The patient requires daily medical management by a physician with specialized training in physical medicine and rehabilitation for the following conditions: Daily direction of a multidisciplinary physical rehabilitation program to ensure safe treatment while eliciting the highest outcome that is of practical value to the patient.: Yes Daily medical management of patient stability for increased activity during participation in an intensive rehabilitation regime.: Yes Daily analysis of laboratory values and/or radiology reports with any subsequent need for medication adjustment of medical intervention for : Neurological problems;Other  Pranshu Lyster, Silvestre Mesi 08/03/2014, 3:20 PM

## 2014-08-03 NOTE — Progress Notes (Signed)
Occupational Therapy Session Note  Patient Details  Name: Linda [name] MRN: [id number] Date of Birth: 09/06/1946  Today's Date: 08/03/2014 Time: 1510-1555 Time Calculation (min): 45 min  Short Term Goals: Week 1:  OT Short Term Goal 1 (Week 1): Short Term Goals = Long Term Goal secondary to ELOS  Skilled Therapeutic Interventions/Progress Updates:   Pt with no c/o pain during session. Pt received from PTA in gym seated on edge of mat. OT session with focus on energy conservation, pursed lip breathing, B UE strengthening, and functional transfers. OT educated energy conservation techniques of general principles and related to ADL tasks. Pt able to verbalize one of these principles at end of session. Education to continue. Pt on 3L O2 via [place] during session with O2 remaining 92-97%. Pt performed 2 sets of 10 wheelchair pushups in order to increased B UE strength and ability to perform functional transfers. Pt required rest break and verbal cues for encouragement of pursed lip breathing during session. Pt propelled self back to room from therapy gym ~ 200 feet with use of BUEs in order to increase endurance and strength. Pt performed stand pivot transfer from wheelchair to [facility] with steady assist. Pt left seated in recliner chair, feet up, all needed items within reach, and son present in room.    Therapy Documentation Precautions:  Precautions Precautions: Fall Precaution Comments: 3 L/min O2 with activity Required Braces or Orthoses: Other Brace/Splint Other Brace/Splint: abdominal binder Restrictions Weight Bearing Restrictions: No Vital Signs: Therapy Vitals Pulse Rate: 75 BP: 145/78 mmHg Patient Position (if appropriate): Sitting Pain: No c/o pain during session. See FIM for current functional status  Therapy/Group: Individual Therapy  Phineas Semen 08/03/2014, 5:12 PM

## 2014-08-03 NOTE — Progress Notes (Signed)
Social Work Patient ID: Linda [name], female   DOB: Jul 10, 1946, 68 y.o.   MRN: [id number]  CSW met with pt to update her on team conference discussion.  Pt stated her son would be by this afternoon, but CSW did not see him yet, so will call him tomorrow.  Pt is aware she will need 24/7 supervision and has chosen going home with private duty care is the best option for her.  She will not have 24/7 supervision with family and does not want to go to a SNF.  Pt has begun calling agencies.  CSW to f/u with son, as pt wonders if he has already arranged something/someone.  Pt reports still feeling depressed and thinks her antidepressant is not working and she wants it increased.  CSW spoke with Reesa Chew, PA, who stated MD d/c'd it due the fact that it may be, with other medications, causing her episodes.  Neuropsychologist to f/u with pt tomorrow.  Palliative Care NP continues to see pt daily for bereavement support.  CSW provided supportive listening and encouraged her to consider counseling at hospice.  Pt is open to CSW bringing her a journal to try to get her thoughts out on paper.  CSW also rescheduled her appt with Dr. Marin Olp as she will miss hers on 08-05-14 while still here on CIR.  Pt was appreciative.  CSW is concerned about pt's depression and expressed this with PA.  CSW will f/u with Neuropsychologist tomorrow prior to her visit with pt.

## 2014-08-03 NOTE — Progress Notes (Signed)
Social Work Patient ID: Linda [name], female   DOB: Jan 13, 1946, 68 y.o.   MRN: [id number]  Linda [name] Nadiya Pieratt, LCSW Social Worker Signed  Patient Care Conference Service date: 08/03/2014 3:20 PM  Inpatient RehabilitationTeam Conference and Plan of Care Update Date: 08/02/2014   Time: 2:15 PM     Patient Name: Linda [name]       Medical Record Number: [id number]   Date of Birth: September 08, 1946 Sex: Female         Room/Bed: 4W19C/4W19C-01 Payor Info: Payor: MEDICARE / Plan: MEDICARE PART A AND B / Product Type: *No Product type* /   Admitting Diagnosis: Gait disorder   Admit Date/Time:  07/26/2014  3:06 PM Admission Comments: No comment available   Primary Diagnosis:  Multifactorial gait disorder Principal Problem: Multifactorial gait disorder    Patient Active Problem List     Diagnosis  Date Noted   .  Multifactorial gait disorder  07/26/2014   .  Chronic respiratory failure  07/23/2014   .  Epistaxis  07/18/2014   .  Acute GI bleeding  07/17/2014   .  Anemia associated with acute blood loss  07/17/2014   .  Syncope  07/17/2014   .  History of pulmonary embolism  07/17/2014   .  GI bleed  07/17/2014   .  Arthritis  05/11/2014   .  DDD (degenerative disc disease)  05/11/2014   .  Fibrositis  05/11/2014   .  Amianthosis  05/11/2014   .  Grief  04/28/2014   .  OSA (obstructive sleep apnea)  04/24/2014   .  Chronic diastolic CHF, NYHA class 1  04/24/2014   .  Acute pulmonary embolism  08/13/2013   .  Pleural plaque with presence of asbestos  07/22/2013   .  Iron deficiency anemia, unspecified  06/28/2013   .  Polypharmacy  04/26/2013   .  Fibromyalgia syndrome  03/01/2013   .  Insomnia  11/12/2012   .  HTN (hypertension), benign  10/25/2012     Expected Discharge Date: Expected Discharge Date: 08/09/14  Team Members Present: Physician leading conference: Dr. Alger Simons Social Worker Present: Linda Alpers, LCSW Nurse Present: Linda Means, RN PT  Present: Linda [name], Linda [name], PT OT Present: Linda [name], OT SLP Present: Linda [name], SLP Other (Discipline and Name): Linda Baxter, RN [facility]) PPS Coordinator present : Linda Nakayama, RN, CRRN        Current Status/Progress  Goal  Weekly Team Focus   Medical     orthostatic changes with poor awareness  simplify med regimen  reduce meds which can cause orthostatsis   Bowel/Bladder     cont of bowel and bladder; has been refusing sched bowel meds but having regular BMs; LBM 8/10  remain cont of bowel and bladder  cont to monitor BMs, patient is taking narcotics   Swallow/Nutrition/ Hydration            ADL's     steady A/supervision overall; limited by orthostatic hypotension  supervision overall  activity tolerance, safety awareness   Mobility     Limited by syncope/orthostatic hypotension, min A overall w/ transfers and short distance amb w/ RW, poor emergent awareness regarding need to sit when experiencing lightheadedness; (S) for w/c propulsion  Downgraded, supervision overall  activity tolerance, self-awareness (syncope) with gait, pt/family education, balance, overall safety during standing mobility/transfers   Communication            Safety/Cognition/ Behavioral Observations  Pain     pain level up to 8/10, down to 6/10 after percocet 2 tabs for chronic back pain  pain less than 3  monitor pain, offer pain meds   Skin     red, dry patches to bilateral legs and arms; eucerin cream applied  no infection or skin breakdown  assess skin qshift and prn    Rehab Goals Patient on target to meet rehab goals: No Rehab Goals Revised: Pt's goals have been downgraded to supervision. *See Care Plan and progress notes for long and short-term goals.    Barriers to Discharge:  poor awareness of deficits      Possible Resolutions to Barriers:    downgrade to sup      Discharge Planning/Teaching Needs:    Pt is trying to arrange 24/7 private duty care for herself  at home.   Family or caregiver can receive family education as needed.    Team Discussion:    MD is trying to see if simplifying pt's medications will decreased the orthostatic episodes.  Pt is participating with therapists, but doesn't stop when she begins having symptoms.  Goals are being downgraded to supervision as a result.  Pt needs to be able to self monitor when to sit down and she cannot due that yet due to safety awareness deficits.  Pt is continent.     Revisions to Treatment Plan:    Goals downgraded to supervision    Continued Need for Acute Rehabilitation Level of Care: The patient requires daily medical management by a physician with specialized training in physical medicine and rehabilitation for the following conditions: Daily direction of a multidisciplinary physical rehabilitation program to ensure safe treatment while eliciting the highest outcome that is of practical value to the patient.: Yes Daily medical management of patient stability for increased activity during participation in an intensive rehabilitation regime.: Yes Daily analysis of laboratory values and/or radiology reports with any subsequent need for medication adjustment of medical intervention for : Neurological problems;Other  Linda [name], Linda [name] 08/03/2014, 3:20 PM         Linda [name] Linda Moorhouse, LCSW Social Worker Signed  Patient Care Conference Service date: 08/03/2014 3:20 PM  Inpatient RehabilitationTeam Conference and Plan of Care Update Date: 08/02/2014   Time: 2:15 PM     Patient Name: Linda [name]       Medical Record Number: [id number]   Date of Birth: [birth year] Sex: Female         Room/Bed: 4W19C/4W19C-01 Payor Info: Payor: MEDICARE / Plan: MEDICARE PART A AND B / Product Type: *No Product type* /   Admitting Diagnosis: Gait disorder   Admit Date/Time:  07/26/2014  3:06 PM Admission Comments: No comment available   Primary Diagnosis:  Multifactorial gait disorder Principal  Problem: Multifactorial gait disorder    Patient Active Problem List     Diagnosis  Date Noted   .  Multifactorial gait disorder  07/26/2014   .  Chronic respiratory failure  07/23/2014   .  Epistaxis  07/18/2014   .  Acute GI bleeding  07/17/2014   .  Anemia associated with acute blood loss  07/17/2014   .  Syncope  07/17/2014   .  History of pulmonary embolism  07/17/2014   .  GI bleed  07/17/2014   .  Arthritis  05/11/2014   .  DDD (degenerative disc disease)  05/11/2014   .  Fibrositis  05/11/2014   .  Amianthosis  05/11/2014   .  Grief  04/28/2014   .  OSA (obstructive sleep apnea)  04/24/2014   .  Chronic diastolic CHF, NYHA class 1  04/24/2014   .  Acute pulmonary embolism  08/13/2013   .  Pleural plaque with presence of asbestos  07/22/2013   .  Iron deficiency anemia, unspecified  06/28/2013   .  Polypharmacy  04/26/2013   .  Fibromyalgia syndrome  03/01/2013   .  Insomnia  11/12/2012   .  HTN (hypertension), benign  10/25/2012     Expected Discharge Date: Expected Discharge Date: 08/09/14  Team Members Present: Physician leading conference: Dr. Alger Simons Social Worker Present: Linda Alpers, LCSW Nurse Present: Linda Means, RN PT Present: Linda [name], Linda [name], PT OT Present: Linda [name], OT SLP Present: Linda [name], SLP Other (Discipline and Name): Linda Baxter, RN [facility]) PPS Coordinator present : Linda Nakayama, RN, CRRN        Current Status/Progress  Goal  Weekly Team Focus   Medical     orthostatic changes with poor awareness  simplify med regimen  reduce meds which can cause orthostatsis   Bowel/Bladder     cont of bowel and bladder; has been refusing sched bowel meds but having regular BMs; LBM 8/10  remain cont of bowel and bladder  cont to monitor BMs, patient is taking narcotics   Swallow/Nutrition/ Hydration            ADL's     steady A/supervision overall; limited by orthostatic hypotension  supervision overall  activity  tolerance, safety awareness   Mobility     Limited by syncope/orthostatic hypotension, min A overall w/ transfers and short distance amb w/ RW, poor emergent awareness regarding need to sit when experiencing lightheadedness; (S) for w/c propulsion  Downgraded, supervision overall  activity tolerance, self-awareness (syncope) with gait, pt/family education, balance, overall safety during standing mobility/transfers   Communication            Safety/Cognition/ Behavioral Observations           Pain     pain level up to 8/10, down to 6/10 after percocet 2 tabs for chronic back pain  pain less than 3  monitor pain, offer pain meds   Skin     red, dry patches to bilateral legs and arms; eucerin cream applied  no infection or skin breakdown  assess skin qshift and prn    Rehab Goals Patient on target to meet rehab goals: No Rehab Goals Revised: Pt's goals have been downgraded to supervision. *See Care Plan and progress notes for long and short-term goals.    Barriers to Discharge:  poor awareness of deficits      Possible Resolutions to Barriers:    downgrade to sup      Discharge Planning/Teaching Needs:    Pt is trying to arrange 24/7 private duty care for herself at home.   Family or caregiver can receive family education as needed.    Team Discussion:    MD is trying to see if simplifying pt's medications will decreased the orthostatic episodes.  Pt is participating with therapists, but doesn't stop when she begins having symptoms.  Goals are being downgraded to supervision as a result.  Pt needs to be able to self monitor when to sit down and she cannot due that yet due to safety awareness deficits.  Pt is continent.     Revisions to Treatment Plan:    Goals downgraded to supervision    Continued Need for  Acute Rehabilitation Level of Care: The patient requires daily medical management by a physician with specialized training in physical medicine and rehabilitation for the following  conditions: Daily direction of a multidisciplinary physical rehabilitation program to ensure safe treatment while eliciting the highest outcome that is of practical value to the patient.: Yes Daily medical management of patient stability for increased activity during participation in an intensive rehabilitation regime.: Yes Daily analysis of laboratory values and/or radiology reports with any subsequent need for medication adjustment of medical intervention for : Neurological problems;Other  Khadim Lundberg, Linda [name] 08/03/2014, 3:20 PM

## 2014-08-03 NOTE — Progress Notes (Signed)
Physical Therapy Session Note  Patient Details  Name: Linda [name] MRN: [id number] Date of Birth: [birth year]  Today's Date: 08/03/2014 Time: 1410-1510 Time Calculation (min): 60 min  Short Term Goals: Week 1:  PT Short Term Goal 1 (Week 1): STG=LTGs Week 2:     Skilled Therapeutic Interventions/Progress Updates:  Pt. Received seated in recliner with guest present. Guest (bill) inquires about patient status; pt. Agreeable to discussing/answering questions on progress and pt. Post-rehab stay after d/c. Pt. Agreeable to therapy session, and wants to discuss options and understand current situation. Abdominal binder and TED hose donned for session  1:1 Treatment focused on patient education, w/c mgmt, transfers. Pt. Assist level is S with transfers, and min A with W/C mgmt Treatment consisted of patient propelling w/c for 150' x 2 and required several therapeutic rest recovery breaks. Pt. Indicated lightheadedness and visual field disruptions; "tunnel vision and gets smaller." Pt. Performed additional sit<>stand trials for increased upright/standing tolerance with variable outcomes of <20 seconds to >90 seconds. Pt. Performed static sitting with no UE support only tolerating 6 minutes of unsupported postural stability before requiring mod A for balance related to syncope. Pt. Continues to tolerate increased activity in seated positions; tolerance for ambulation and standing has been inconsistent; pt requires frequent coaching on self-awareness with syncope, and provides little or no warning of syncope event(s).  Pain: pt. Has unrated pain; states they gave her medicine at 1400 and it was taking effect.  Pt. Condition post therapy session: handoff to OT in gym with student observer present.  Therapy Documentation Precautions:  Precautions Precautions: Fall Precaution Comments: 3 L/min O2 with activity Required Braces or Orthoses: Other Brace/Splint Other Brace/Splint: abdominal  binder Restrictions Weight Bearing Restrictions: No  Pain: Pain Assessment Pain Assessment: 0-10 Pain Score: 2  Pain Type: Chronic pain Pain Location: Back Pain Onset: On-going Pain Intervention(s): Medication (See eMAR)  Locomotion : Wheelchair Mobility Distance: 150   See FIM for current functional status  Therapy/Group: Individual Therapy  Juluis Mire 08/03/2014, 4:58 PM

## 2014-08-03 NOTE — Telephone Encounter (Signed)
[facility] inpatient rehab called and cx 08/05/14 apt for patient due to patient being in rehab

## 2014-08-03 NOTE — Progress Notes (Signed)
[facility] PHYSICAL MEDICINE & REHABILITATION     PROGRESS NOTE    Subjective/Complaints: Slept ok yest Discussed care team Discussed pain med regimen at home Used elavil 100mg  qhs- now on 75mg  Oxycodone 10mg  BID or TID Lyrica Savella Tramadol at hs Review of Systems - Negative except dizziness Objective: Vital Signs: Blood pressure 156/63, pulse 71, temperature 97.6 F (36.4 C), temperature source Oral, resp. rate 18, weight 74.7 kg (164 lb 10.9 oz), SpO2 100.00%. No results found.  Recent Labs  08/02/14 1410  WBC 6.7  HGB 10.9*  HCT 34.7*  PLT 164    Recent Labs  08/02/14 1410  NA 140  K 3.7  CL 99  GLUCOSE 121*  BUN 14  CREATININE 0.68  CALCIUM 9.0   CBG (last 3)  No results found for this basename: GLUCAP,  in the last 72 hours  Wt Readings from Last 3 Encounters:  08/03/14 74.7 kg (164 lb 10.9 oz)  07/26/14 62.1 kg (136 lb 14.5 oz)  07/26/14 62.1 kg (136 lb 14.5 oz)    Physical Exam:  Constitutional: She is oriented to person, place, and time. She appears well-developed and well-nourished.  No distress HENT: oral mucosa pink, dentition good  Head: Normocephalic and atraumatic.  Eyes: Conjunctivae are normal. Pupils are equal, round, and reactive to light.  Neck: Normal range of motion. Neck supple.  Cardiovascular: Normal rate and regular rhythm. No murmurs  Respiratory: Effort normal and breath sounds normal. No respiratory distress. She has no wheezes.  GI: Soft. Bowel sounds are normal. She exhibits no distension. There is no tenderness.  Neurological: She is alert and oriented to person, place, and time. Mild sensory loss over right foot and ulnar hand is persistent---otherwise normal sensory exam. Strength grossly 4+/5 in bilateral UE's. LE's 4/5 HF and Knees, 4/5 ankles.. Normal rom and fmc. Cognitively displays good insight and awareness.  Skin: Skin is warm and dry.  Psychiatric: Her speech is normal and behavior is normal. Judgment normal.  Her mood is slightly anxious. Slow to respond to questions , flat affect  .    Assessment/Plan: 1. Functional deficits secondary to gait disorder/deconditioning which require 3+ hours per day of interdisciplinary therapy in a comprehensive inpatient rehab setting. Physiatrist is providing close team supervision and 24 hour management of active medical problems listed below. Physiatrist and rehab team continue to assess barriers to discharge/monitor patient progress toward functional and medical goals. FIM: FIM - Bathing Bathing Steps Patient Completed: Chest;Right Arm;Left Arm;Abdomen;Front perineal area;Buttocks;Right upper leg;Left upper leg;Right lower leg (including foot);Left lower leg (including foot) Bathing: 5: Supervision: Safety issues/verbal cues  FIM - Upper Body Dressing/Undressing Upper body dressing/undressing steps patient completed: Thread/unthread right bra strap;Thread/unthread left bra strap;Hook/unhook bra;Thread/unthread right sleeve of pullover shirt/dresss;Thread/unthread left sleeve of pullover shirt/dress;Pull shirt over trunk;Put head through opening of pull over shirt/dress Upper body dressing/undressing: 5: Set-up assist to: Obtain clothing/put away FIM - Lower Body Dressing/Undressing Lower body dressing/undressing steps patient completed: Don/Doff right sock;Don/Doff left sock Lower body dressing/undressing: 5: Set-up assist to: Don/Doff TED stocking  FIM - Toileting Toileting steps completed by patient: Adjust clothing prior to toileting;Performs perineal hygiene;Adjust clothing after toileting Toileting: 5: Supervision: Safety issues/verbal cues  FIM - Radio producer Devices: Grab bars Toilet Transfers: 0-Activity did not occur  FIM - Control and instrumentation engineer Devices: Environmental consultant;Arm rests Bed/Chair Transfer: 4: Chair or W/C > Bed: Min A (steadying Pt. > 75%);4: Bed > Chair or W/C: Min A (steadying Pt. >  75%)  FIM - Locomotion: Wheelchair Distance: 150 Locomotion: Wheelchair: 5: Travels 150 ft or more: maneuvers on rugs and over door sills with supervision, cueing or coaxing FIM - Locomotion: Ambulation Locomotion: Ambulation Assistive Devices: Administrator Ambulation/Gait Assistance: 4: Min guard;4: Min assist;3: Mod assist Locomotion: Ambulation: 1: Travels less than 50 ft with moderate assistance (Pt: 50 - 74%)  Comprehension Comprehension Mode: Auditory Comprehension: 5-Understands complex 90% of the time/Cues < 10% of the time  Expression Expression Mode: Verbal Expression: 5-Expresses complex 90% of the time/cues < 10% of the time  Social Interaction Social Interaction: 6-Interacts appropriately with others with medication or extra time (anti-anxiety, antidepressant).  Problem Solving Problem Solving: 5-Solves basic 90% of the time/requires cueing < 10% of the time  Memory Memory: 5-Recognizes or recalls 90% of the time/requires cueing < 10% of the time  Medical Problem List and Plan:  1. Functional deficits secondary to Gait disorder due to multiple medical and psychosocial issues---most notably her GI blood loss and loss of husband.  2. DVT Prophylaxis/Anticoagulation: SCD's, ambulation 3. Back pain/fibromyalgia/R>L Knee OA  Pain Management: Will change   percocet  To home dose Continue lidocaine patch, Lyrica , reduce elavil.   Off savella.  4. Depression with anxiety/Grief reaction/Mood: D/C paxil since she is on antidepressant doses of trazodone and elavil with xanax prn. Team to provide ego support. Will have LCSW follow for support and evaluation. Neuropsych to follow up for support  5. Neuropsych: This patient is capable of making decisions on her own behalf.  6. Skin/Wound Care: Routine pressure relief measures. Added nutritional supplement.  7. Gastritis: Continue PPI. Serial check of H/H. Off  ASA. GI to determine restart date 8. Acute on chronic anemia: Iron  supplemented and to follow up with hem/onc past discharge. Continue folic acid.  9. Dizziness: Check orthostatic blood pressures.  Reduce elavil, check BMET and CBC TED and binder to help with symptoms. H/o BPPV in the past.  10 Right petrous bone mass: will need follow up MRI for work up.  11. Chronic insomnia: CPAP not functional---will need to follow up with pulmonary to reschedule study. Continue trazodone at bedtime. --increased to 100-200mg  qhs per home routine 12. Non-obst CAD with angina: continue ASA with bystolic daily. Off imdur d/c due to orthostatic changes. Monitor for symptoms with increase in activity.  88. [name] with asthma: Oxygen dependent. Albuterol  prn.  14.  Increased response latency- mood disorder vs medication effect vs early dementia- neuropsych eval, simplify med regimen LOS (Days) 8 A FACE TO FACE EVALUATION WAS PERFORMED  Charlett Blake 08/03/2014 8:49 AM

## 2014-08-03 NOTE — Progress Notes (Signed)
Occupational Therapy Session Note  Patient Details  Name: Linda [name] MRN: [id number] Date of Birth: 31-Dec-1945  Today's Date: 08/03/2014 Time: 0700-0800 Time Calculation (min): 60 min  Short Term Goals: Week 1:  OT Short Term Goal 1 (Week 1): Short Term Goals = Long Term Goal secondary to ELOS  Skilled Therapeutic Interventions/Progress Updates:    Pt resting in bed upon arrival and requested to take shower this morning.  Pt amb with RW approx 10' (steady A) and pt's legs started shaking and knees buckling.  Pt required tot A to position in recliner.  Pt stated she had not indication that her legs were "giving out" and that "everything just started getting dark." Pt completed bathing and dressing tasks with sit<>stand from w/c at sink.  Pt removed O2 while bathing and O2 sats<86%.  Pt recovered to 95% when 3L O2 placed via [place].  Pt required steady A when standing at sink this morning.  BP seated at 108/73 and standing at 112/70.  Focus on activity tolerance, transfers, functional amb with RW, dynamic standing balance, and safety awareness.  Therapy Documentation Precautions:  Precautions Precautions: Fall Precaution Comments: 3 L/min O2 with activity Required Braces or Orthoses: Other Brace/Splint Other Brace/Splint: abdominal binder Restrictions Weight Bearing Restrictions: No Pain: Pain Assessment Pain Assessment: No/denies pain  See FIM for current functional status  Therapy/Group: Individual Therapy  Leroy Libman 08/03/2014, 8:02 AM

## 2014-08-03 NOTE — Progress Notes (Signed)
NUTRITION FOLLOW-UP  DOCUMENTATION CODES Per approved criteria  -Obesity Unspecified   INTERVENTION: -Continue providing Magic cup TID between meals, each supplement provides 290 kcal and 9 grams of protein.  -Provide PM snack (grilled cheese sandwich). Ordered.  NUTRITION DIAGNOSIS: Increased nutrient needs related to chronic illness as evidenced by estimated nutrient needs, ongoing  Goal: Pt to meet >/= 90% of their estimated nutrition needs, met  Monitor:  PO intake, weight trends, labs, I/O's  68 y.o. female  Admitting Dx: Multifactorial Gait Disorder  ASSESSMENT: Pt with PMH of hypertension, asthma, osteoarthritis, distolic congestive heart failure, and COPD. Patient with gastritis and presents with gait disorder due to multiple medical and psychosocial issues.  8/5-Spoke with pt. Pt reports her appetite is good with meal completion of 100%. Pt reports being nauseous at time, but it does not interfere a lot with her appetite. Pt denies any stomach pains or difficulties eating or swallowing. Pt reports she would like a oral supplement to help her consume extra calories and protein as she reports she is needing the energy for the therapy she is receiving. Pt is willing to try magic cup. Will order. Nutrition focused physical exam was performed. No signs of significant fat and/or muscle mass depletion.  8/12- Pt reports her appetite is fine. Meal completion is 50-100%, however today's meal completion has been 50%. Pt reports she is not use to eating 3 full meals a day while hospitalized, she usually eats around 2 meals a day at home. Pt has been getting her Magic cup and likes it. Pt requests she would also like a snack in the PM as she reports she gets hungry at around bedtime because she would eat dinner early. Pt reports wanting a grilled cheese sandwich. Will order for a nourish snack at 8 pm. Pt denies any stomach pains, or difficulties chewing/swallowing.  Height: Ht Readings  from Last 1 Encounters:  07/18/14 '5\' 1"'  (1.549 m)    Weight: Wt Readings from Last 1 Encounters:  08/03/14 164 lb 10.9 oz (74.7 kg)  Net: +7 L since admission 8/4.  BMI:  Body mass index is 31.13 kg/(m^2). Class I obesity  Re-Estimated Nutritional Needs: Kcal: 1800-2000 Protein: 75-85 grams Fluid: 1.8 L- 2L/ day  Skin: no issues noted  [name]  Intake/Output Summary (Last 24 hours) at 08/03/14 1446 Last data filed at 08/03/14 1200  Gross per 24 hour  Intake    720 ml  Output      0 ml  Net    720 ml    Last BM: 8/12  Labs:   Recent Labs Lab 08/02/14 1410  NA 140  K 3.7  CL 99  CO2 26  BUN 14  CREATININE 0.68  CALCIUM 9.0  GLUCOSE 121*    CBG (last 3)  No results found for this basename: GLUCAP,  in the last 72 hours  Scheduled Meds: . amitriptyline  50 mg Oral QHS  . atorvastatin  20 mg Oral q1800  . folic acid  2 mg Oral Daily  . hydrocerin   Topical BID  . lidocaine  1 patch Transdermal Q24H  . nebivolol  5 mg Oral Daily  . pantoprazole  40 mg Oral Q0600  . polyethylene glycol  17 g Oral BID  . pregabalin  200 mg Oral Daily  . pregabalin  400 mg Oral QHS  . senna-docusate  2 tablet Oral BID  . traZODone  100-200 mg Oral QHS    Continuous Infusions:  Past Surgical History  Procedure Laterality Date  . Appendectomy  1960  . Total abdominal hysterectomy  1974  . Tendon release  1996    Right forearm and hand  . Knee surgery  2005  . Heel spur surgery Left 2008  . Plantar fascia release Left 2008  . Axillary surgery Left 1978    Multiple "lump" in armpit per pt  . Coccyx removal  1972  . Cardiac catheterization  01/2013    nonobstructive CAD, EF 55-60%  . Transthoracic echocardiogram  01/2013; 04/2014    2014--NORMAL.  2015--focal basal septal hypertrophy, EF 55-60%, grade I diast dysfxn, mild LAE.    . Dilation and curettage of uterus  ? 1970's  . Eye surgery Left 2012-2013    "injections for ~ 1 yr; don't really know what  for" (07/12/2013)  . Spirometry  04/25/14    In hosp for acute asthma/COPD flare: mixed obstructive and restrictive lung disease. The FEV1 is severely reduced at 45% predicted.  FEV1 signif decreased compared to prior spirometry 07/23/13.  [name] Kitchen Esophagogastroduodenoscopy N/A 07/19/2014    Procedure: ESOPHAGOGASTRODUODENOSCOPY ;  Surgeon: Ladene Artist, MD;  Location: [facility] ENDOSCOPY;  Service: Endoscopy;  Laterality: N/A;    Kallie Locks, MS, Provisional LDN Pager # [phone] After hours/ weekend pager # [phone]

## 2014-08-03 NOTE — Progress Notes (Signed)
Physical Therapy Weekly Progress Note  Patient Details  Name: Linda [name] MRN: [id number] Date of Birth: [birth year]  Beginning of progress report period: July 26, 2014 End of progress report period: August 03, 2014  Today's Date: 08/03/2014 Time: 4098-1191 Time Calculation (min): 60 min  Patient has met 4 of 9 short term goals.    Patient continues to demonstrate the following deficits: endurance, balance, activity tolerance, safety with gait and transfers and therefore will continue to benefit from skilled PT intervention to enhance overall performance with activity tolerance, balance, awareness and coordination.  Patient progressing toward long term goals..  Continue plan of care.  PT Short Term Goals Week 1:  PT Short Term Goal 1 (Week 1): STG=LTGs  Skilled Therapeutic Interventions/Progress Updates:    Therapeutic Activity: PT instructs pt in toilet transfer, initially req SBA with RW, then pt pauses and leans against doorframe on way to toilet, req CGA and verbal cues to walk to toilet and sit down. PT notes pt stands mod I with grab bar to manage pants. Pt manages own hygiene. Pt then req SBA, progressing to CGA and verbal cues to walk to w/c to sit and rest when transferring from toilet to w/c. Pt uses RW and 3 L O2/min on portable tank during toilet transfer.   PT notes pt has BM on the front of her shirt after using the bathroom. PT brings pt a different shirt to change into and pt changes shirts while sitting up in w/c. Pt then stands and walks over to sink, where she washes out the BM from her shirt in standing with supervision, leaning on sink, scrubbing shirt for a few minutes, then rinsing shirt. Pt then reports she has a pair of pants to wash, as well. PT brings pt pants and she washes them out in the sink while sitting in w/c due to low activity tolerance. PT instructs pt to hang shirt and pants on grab bar in bathroom to allow them to air dry, walking into BR with RW  and SBA.   Pt demonstrates w/c to/from bed transfer without AD req SBA.   Gait Training: PT instructs pt in ambulation with RW x 50' req SBA - pt then begins to stagger and pt assists pt in stand to sit transfer to w/c. PT took pt's vitals immediately after (< 1 minute) assisted sit and BP 132/74, SaO2 97% on 3 L/min, and HR 80bpm. PT explained to pt that her vitals were normal and it was a mystery why she felt like things were going "black".   W/C Management: Pt demonstrates w/c propulsion x 150' mod I with B UEs. Pt demonstrates ability to doff B legrests and don/doff brakes independently.   PT suspects a psychosocial component to pt's episodes of near syncope. Pt is still in grief over recent death of husband 4 months ago. PT encouraged pt to find someone to talk about this with. Pt's functional mobility is inconsistent and pt is currently not safe to ambulate by herself.    Therapy Documentation Precautions:  Precautions Precautions: Fall Precaution Comments: 3 L/min O2 with activity Required Braces or Orthoses: Other Brace/Splint Other Brace/Splint: abdominal binder Restrictions Weight Bearing Restrictions: No    Vital Signs: Therapy Vitals Temp: 97.6 F (36.4 C) Temp src: Oral Pulse Rate: 87 BP: 126/74 mmHg Patient Position (if appropriate): Sitting Oxygen Therapy SpO2: 96 % O2 Device: Nasal cannula O2 Flow Rate (L/min): 3 L/min Pain: Pain Assessment Pain Assessment: 0-10 Pain Score: 6  Pain Type: Chronic pain Pain Location: Back Pain Orientation: Lower Pain Descriptors / Indicators: Radiating (down R leg) Pain Onset: On-going Pain Intervention(s): Rest;Distraction Multiple Pain Sites: Yes 2nd Pain Site Pain Score: 4 Pain Type: Acute pain Pain Location: Head Pain Descriptors / Indicators: Aching Pain Frequency: Occasional Pain Onset: Gradual Pain Intervention(s): Rest;Distraction  See FIM for current functional status  Therapy/Group: Individual  Therapy  Weylin Plagge M 08/03/2014, 8:40 AM

## 2014-08-04 ENCOUNTER — Inpatient Hospital Stay (HOSPITAL_COMMUNITY): Payer: Medicare Other

## 2014-08-04 ENCOUNTER — Encounter (HOSPITAL_COMMUNITY): Payer: Medicare Other

## 2014-08-04 DIAGNOSIS — R5381 Other malaise: Secondary | ICD-10-CM

## 2014-08-04 DIAGNOSIS — F341 Dysthymic disorder: Secondary | ICD-10-CM

## 2014-08-04 DIAGNOSIS — K922 Gastrointestinal hemorrhage, unspecified: Secondary | ICD-10-CM

## 2014-08-04 DIAGNOSIS — IMO0001 Reserved for inherently not codable concepts without codable children: Secondary | ICD-10-CM

## 2014-08-04 DIAGNOSIS — F411 Generalized anxiety disorder: Secondary | ICD-10-CM

## 2014-08-04 DIAGNOSIS — Z5189 Encounter for other specified aftercare: Secondary | ICD-10-CM

## 2014-08-04 DIAGNOSIS — Z634 Disappearance and death of family member: Secondary | ICD-10-CM

## 2014-08-04 DIAGNOSIS — F332 Major depressive disorder, recurrent severe without psychotic features: Secondary | ICD-10-CM

## 2014-08-04 DIAGNOSIS — D62 Acute posthemorrhagic anemia: Secondary | ICD-10-CM

## 2014-08-04 MED ORDER — DULOXETINE HCL 30 MG PO CPEP
30.0000 mg | ORAL_CAPSULE | Freq: Every day | ORAL | Status: DC
  Administered 2014-08-04 – 2014-08-05 (×2): 30 mg via ORAL
  Filled 2014-08-04 (×3): qty 1

## 2014-08-04 MED ORDER — TRAZODONE HCL 50 MG PO TABS
100.0000 mg | ORAL_TABLET | Freq: Every day | ORAL | Status: DC
  Administered 2014-08-04: 100 mg via ORAL
  Filled 2014-08-04: qty 2

## 2014-08-04 MED ORDER — CLONAZEPAM 0.5 MG PO TABS
0.5000 mg | ORAL_TABLET | Freq: Two times a day (BID) | ORAL | Status: DC
  Administered 2014-08-04 – 2014-08-06 (×4): 0.5 mg via ORAL
  Filled 2014-08-04 (×4): qty 1

## 2014-08-04 MED ORDER — GABAPENTIN 300 MG PO CAPS
300.0000 mg | ORAL_CAPSULE | Freq: Two times a day (BID) | ORAL | Status: DC
  Administered 2014-08-04: 300 mg via ORAL
  Filled 2014-08-04 (×4): qty 1

## 2014-08-04 MED ORDER — GABAPENTIN 600 MG PO TABS
300.0000 mg | ORAL_TABLET | Freq: Two times a day (BID) | ORAL | Status: DC
  Filled 2014-08-04 (×2): qty 0.5

## 2014-08-04 NOTE — Progress Notes (Addendum)
Occupational Therapy Session and Weekly Progress Note  Patient Details  Name: Linda [name] MRN: [id number] Date of Birth: 07/03/1946  Beginning of progress report period: July 26, 2014 End of progress report period: August 04, 2014  Today's Date: 08/04/2014 Time: 1030-1130 Time Calculation (min): 60 min  Patient has struggled to maintain orthostatic resilience while performing BADL during this admission; short term goals have been downgraded to reflect need for continuous supervision for safety.  Patient continues to demonstrate the following deficits: Impaired anticipatory awareness relating to energy conservation and symptom management of chronically low oxygen saturation, ongoing symptoms of syncope and near falls, depression s/p bereavement, and therefore will continue to benefit from skilled OT intervention to enhance overall performance with BADL.  Patient not progressing toward initial long term goals.  See goal revisions.  Plan of care revisions: downgraded to reflect need for continous supervision versus modified independence.  OT Short Term Goals Week 1:  OT Short Term Goal 1 (Week 1): Short Term Goals = Long Term Goal secondary to ELOS OT Short Term Goal 1 - Progress (Week 1): Met Week 2:  OT Short Term Goal 1 (Week 2): Pt will demo improved awareness of compensatory strategies to reduce episodes of orthostatic hypotension during ADL OT Short Term Goal 2 (Week 2): Pt will complete lower body dressing using AE, prn, with setup assist OT Short Term Goal 3 (Week 2): Pt will don/doff abdominal binder with setup assist  Skilled Therapeutic Interventions/Progress Updates: ADL-retraining with focus on endurance, improved anticipatory awareness and symptom management, and re-ed on energy conservation.   Pt received in her w/c, awaiting therapist with son present; son exited room as session began. Pt reported need to toilet and required setup and steadying assist to stand at [facility] with  min guard assist during mobility d/t her expressed concern relating to unpredictable collapse of knees while ambulating.   No evidence of collapse noted while pt transferred to toilet and completed toileting.   Pt undressed at toilet and proceeded to tub bench to shower with min assist to ambulate to bench slowly and verbal cues to locate grab bars.   Pt completed bath seated and returned to [facility] to dress with min assist to lace pants after reporting she was "not allowed" to reach down to her feet.  OT then re-educated pt on use of reacher to recover items off floor and to assist with dressing.    After assist to don TEDs and binder, pt ambulated back to recliner and with LOB and near collapse after pivoting to turn and sit down.   O2 sats were assessed at 86% as pt sat in recliner after completing all BADL.   Pt reports awareness of need for supervision although denying need for 24/7 supervision despite admission of poor sleep and unpredictable episodes of syncope.  Therapy Documentation Precautions:  Precautions Precautions: Fall Precaution Comments: 3 L/min O2 with activity Required Braces or Orthoses: Other Brace/Splint Other Brace/Splint: abdominal binder Restrictions Weight Bearing Restrictions: No  Vital Signs: Therapy Vitals Temp: 97.6 F (36.4 C) Temp src: Oral Pulse Rate: 87 Resp: 18 BP: 167/92 mmHg Patient Position (if appropriate): Sitting Oxygen Therapy SpO2: 98 % O2 Device: Nasal cannula  Pain: No/denies pain   Therapy/Group: Individual Therapy  [place] 08/04/2014, 3:15 PM

## 2014-08-04 NOTE — Consult Note (Signed)
NEUROCOGNITIVE STATUS EXAMINATION - [facility]   [name] is a 68 year old woman, who was seen for a brief neurocognitive status examination to evaluate her emotional state and mental status in the setting of multiple medical problems with physical deconditioning.  According to her medical record, she was admitted on 07/17/14 with orthostatic episodes for 2 weeks with onset of nose bleed for 2 days and multiple falls.  CT of her head demonstrated right petrous bone mass.  Further study revealed reactive gastropathy.  She has had consultation with palliative care due to grief over the recent death of her husband and she has also been noted to have anxiety, agitation, and pain.    Emotional Functioning:  During the clinical interview, [name] generally described her mood as "overwhelmed," stating that she had been so busy caring for her husband and trying to organize his things and manage the household after his death that she had not been attending to her own health.  She commented that it has been difficult to deal with the knowledge of her current physical illnesses, but at the same time, it has been validating to learn that her physical illness could explain her cognitive problems.  [name] acknowledged depressed mood currently and in the past.  She acknowledged occasional passive suicidal ideation, but strongly denied plan or intent for self-harm.  However, she questioned whether she may need a higher dose of her antidepressant.  She also noted that she has had anxiety with "panic attacks" in the past too, but has only had one attack recently.  She said that the attack occurred following an unpleasant interaction with her nurse, but she said that she told other staff members and her physician about that event and feels as though proper steps have been taken to resolve the situation.  [name] said that she uses deep breathing techniques and distracts  herself with activities to cope with stressors and mood disruption.  She also commented that she currently feels highly motivated to participate in therapy.  [name] said that she typically gets only 4 hours of sleep per night.    [name] responses to self-report measures of mood symptoms were suggestive of moderate-severe depression and anxiety at this time.    Mental Status:  [name] total score on an overall measure of mental status was not suggestive of the presence of cognitive impairment at the level of dementia.  However, she did lose points for misstating the date and inability to freely recall 2 of 3 previously studied words.  Subjectively, she stated that she is unable to remember conversations or activities that she is planning to do and she has had word-finding problems.  She said that these problems have been noticeable for several months and seem to be progressively worsening.  [name] commented that she is concerned about her cognitive issues and would like to schedule an appointment for a comprehensive neuropsychological evaluation to have them examined.    Impressions and Recommendations:  [name] score on a brief mental status measure was not suggestive of the presence of dementia.  However, given her subjective report of cognitive changes, a comprehensive neuropsychological evaluation post-discharge is recommended.  She expressed fear that she would not follow through on scheduling such an appointment and requested that Dr. Georges Mouse office call her to set this up.  From an emotional standpoint, she seems to be exhibiting clinically significant depression and anxiety.  Much of this is likely due  to bereavement over the death of her husband and adjustment to illness, but there also seems to be longstanding mood issues.  [name] would likely significantly benefit from treatment in individual psychotherapy, but she was adamantly opposed to this for fear that her family  would find out and judge her for it.  She was encouraged to think about psychotherapy and to ask her social worker for information on local providers should she change her mind.   Finally, owing to her report of poor sleep, and the adverse effect that fatigue can have on her physical and cognitive health, her physician may consider adjusting her medication regimen to improve sleep.    DIAGNOSES: Physical deconditioning Bereavement Unspecified anxiety disorder Unspecified depressive disorder  Marlane Hatcher, Psy.D.  Clinical Neuropsychologist

## 2014-08-04 NOTE — Progress Notes (Signed)
Note and chart reviewed.  Pryor Ochoa RD, LDN Pager: [phone]

## 2014-08-04 NOTE — Progress Notes (Signed)
Physical Therapy Session Note  Patient Details  Name: Linda [name] MRN: [id number] Date of Birth: 04/22/1946  Today's Date: 08/04/2014 Time: 0900-1005; 1305-1400 Time Calculation (min): 65 min, 55 min  Short Term Goals: Week 1:  PT Short Term Goal 1 (Week 1): STG=LTGs    Skilled Therapeutic Interventions/Progress Updates:  Tx 1:  Pt donned TEDs and abdominal binder in sitting with assistance to initiate.  Recliner> w/c stand pivot transfer to L with min guard assist.  W/c propulsion x 150' with supervision, slowly, including backing up and parking w/c in confined area.  Brake management with supervision.  Sitting vitals: 110/70; standing 100/70.  No c/o dizziness.O2 sats ranged 86-96% with cues for deep breathing; on 3L O2 via [place].  Cues for deep breathing but no abdominal or chest expansion noted.  Gait training with RW x 55' with min guard assist for first 50', then with mod assist and max cues due to near-syncopal episode.  Pt reported everything goes black; she lay down as soon as she sat, and recovered within 2 minutes..    Therapeutic exercise performed with LE to increase strength for functional mobility: [place] A exs: sitting knee ext, standing calf raises/toe raises, mini squats, hip abd x 10 each.  Pt had LOB backwards 7/10 trials during toe raises, exhibiting automatic stepping but not ankle strategy or hip strategy.  Therapeutic activities: sitting L and R lateral leans for upper trunk/UE strengthening and beach ball volleyball with pt striking ball with 2# wt'ed bar, 20 x 1.  Pt actually smiled and chuckled during ball play.  Tx 2:  Abdominal binder and TEDs. Pt reported pain, unrated, to RN.  Premedicated. HR 88, O2 sats 98%,  Sitting BP 160/88, standing 148/80  at start of session. Affect slightly brighter.  W/c> NuStep stand step transfer with min guard assist.  NuSTep for flexibility and activity tolerance, at level 4, x 10 minutes. HR 90 bpm,  O2 sats after ex =  83%, rising quickly to 99% with cues for deep breathing; abdominal movement noted.  Gait with RW x 25', x 75' x 2  with min guard assist, limited by activity tolerance, "woozy" feeling , per pt.  Pt requested using toilet once in room.  Pt left on toilet; NT took over.    Therapy Documentation Precautions:  Precautions Precautions: Fall Precaution Comments: 3 L/min O2 with activity Required Braces or Orthoses: Other Brace/Splint Other Brace/Splint: abdominal binder Restrictions Weight Bearing Restrictions: No   Pain: Pain Assessment Pain Assessment: RLE pain, unrated, premedicated      See FIM for current functional status  Therapy/Group: Individual Therapy  Essam Lowdermilk 08/04/2014, 1:22 PM

## 2014-08-04 NOTE — Progress Notes (Signed)
Progress Note from the Palliative Medicine Team at [facility]: I had a long discussion with Linda [name]. She is having a difficult time deciding if she wants to go home with 24/7 care or to a nursing facility. She is wondering if assisted living facility would be appropriate. She is struggling more because her son does not believe that she needs 24/7 care. Linda [name] says that she is fearful of being alone and passing out so she does understand the need for 24/7 care. I believe she really wants to be at home as she has previously said because it was the home she shared with her husband that they love. I continue to encourage her to find help in someone that can be with her that is able to drive her places. I am hoping this will help in encouraging her to follow up with hospice grief counseling and to be able to get there. I will continue to follow and support and provide a listening ear for Linda [name].    Objective: Allergies  Allergen Reactions  . Penicillins Itching, Swelling and Rash    Tolerated Cefepime in ED.   Scheduled Meds: . atorvastatin  20 mg Oral q1800  . clonazePAM  0.5 mg Oral BID  . DULoxetine  30 mg Oral Daily  . folic acid  2 mg Oral Daily  . gabapentin  300 mg Oral BID  . hydrocerin   Topical BID  . nebivolol  5 mg Oral Daily  . pantoprazole  40 mg Oral Q0600  . polyethylene glycol  17 g Oral BID  . pregabalin  200 mg Oral Daily  . pregabalin  400 mg Oral QHS  . senna-docusate  2 tablet Oral BID  . traZODone  100 mg Oral QHS   Continuous Infusions:  PRN Meds:.albuterol, alum & mag hydroxide-simeth, bisacodyl, diphenhydrAMINE, guaiFENesin-dextromethorphan, nitroGLYCERIN, ondansetron (ZOFRAN) IV, ondansetron, oxyCODONE-acetaminophen, traMADol  BP 167/92  Pulse 87  Temp(Src) 97.6 F (36.4 C) (Oral)  Resp 18  Wt 75.751 kg (167 lb)  SpO2 98%   PPS: 60%     Intake/Output Summary (Last 24 hours) at 08/04/14 1929 Last data filed at 08/04/14 1800  Gross per 24 hour  Intake   1438 ml  Output      1 ml  Net   1437 ml      LBM: 08/03/14      Physical Exam:  General: NAD, color is improved - less pallor, anxious today HEENT: [place]/AT, no JVD, moist mucous membranes without exudate Chest: CTA throughout, no labored breathing, symmetric CVS: RRR, S1 S2 Abdomen: Soft, NT, ND, +BS Ext: MAE, no edema, warm to touch Neuro: Awake, alert, oriented to 3, follows commands  Labs: CBC    Component Value Date/Time   WBC 6.7 08/02/2014 1410   WBC 6.1 07/08/2014 1221   RBC 3.66* 08/02/2014 1410   RBC 3.56* 07/25/2014 0505   RBC 4.13 07/08/2014 1221   HGB 10.9* 08/02/2014 1410   HGB 12.8 07/08/2014 1221   HCT 34.7* 08/02/2014 1410   HCT 38.6 07/08/2014 1221   PLT 164 08/02/2014 1410   PLT 226 07/08/2014 1221   MCV 94.8 08/02/2014 1410   MCV 94 07/08/2014 1221   MCH 29.8 08/02/2014 1410   MCH 31.0 07/08/2014 1221   MCHC 31.4 08/02/2014 1410   MCHC 33.2 07/08/2014 1221   RDW 14.7 08/02/2014 1410   RDW 12.3 07/08/2014 1221   LYMPHSABS 1.3 07/27/2014 0540   LYMPHSABS 1.5 07/08/2014 1221  MONOABS 0.6 07/27/2014 0540   EOSABS 0.2 07/27/2014 0540   EOSABS 0.1 07/08/2014 1221   BASOSABS 0.1 07/27/2014 0540   BASOSABS 0.0 07/08/2014 1221    BMET    Component Value Date/Time   NA 140 08/02/2014 1410   K 3.7 08/02/2014 1410   CL 99 08/02/2014 1410   CO2 26 08/02/2014 1410   GLUCOSE 121* 08/02/2014 1410   BUN 14 08/02/2014 1410   CREATININE 0.68 08/02/2014 1410   CREATININE 0.65 05/27/2013 1706   CALCIUM 9.0 08/02/2014 1410   GFRNONAA 88* 08/02/2014 1410   GFRAA >90 08/02/2014 1410    CMP     Component Value Date/Time   NA 140 08/02/2014 1410   K 3.7 08/02/2014 1410   CL 99 08/02/2014 1410   CO2 26 08/02/2014 1410   GLUCOSE 121* 08/02/2014 1410   BUN 14 08/02/2014 1410   CREATININE 0.68 08/02/2014 1410   CREATININE 0.65 05/27/2013 1706   CALCIUM 9.0 08/02/2014 1410   PROT 6.6 07/27/2014 0540   ALBUMIN 3.4* 07/27/2014 0540   AST 20 07/27/2014 0540   ALT 16 07/27/2014 0540    ALKPHOS 68 07/27/2014 0540   BILITOT 0.3 07/27/2014 0540   GFRNONAA 88* 08/02/2014 1410   GFRAA >90 08/02/2014 1410    Assessment and Plan: 1. Code Status: FULL 2. Symptom Control: 1. Depression: Following for support. CSW following. Psychiatry following and making medication recommendations. Neurophyscologist following.  3. Psycho/Social: Emotional support provided to patient.  4. Spiritual: Having visits and support from her personal pastor.  5. Disposition: Home with 24/7 care vs nursing facility.     Time In Time Out Total Time Spent with Patient Total Overall Time  1410 1500 2min 69min    Greater than 50%  of this time was spent counseling and coordinating care related to the above assessment and plan.  Vinie Sill, NP Palliative Medicine Team Pager # [phone] (M-F 8a-5p) Team Phone # [phone] (Nights/Weekends)

## 2014-08-04 NOTE — Progress Notes (Signed)
[facility] PHYSICAL MEDICINE & REHABILITATION     PROGRESS NOTE    Subjective/Complaints: Pt requesting stronger antidepressant Slept ok yest Discussed care team Discussed pain med regimen at home Used elavil 100mg  qhs- now on 50mg  Oxycodone 10mg  BID or TID Lyrica 200mg  qd and 400mg  qhs Savella d/ced Tramadol at hs Trazodone 100mg  qhs Review of Systems - Negative except dizziness Objective: Vital Signs: Blood pressure 136/60, pulse 83, temperature 98.6 F (37 C), temperature source Oral, resp. rate 18, weight 75.751 kg (167 lb), SpO2 98.00%. No results found.  Recent Labs  08/02/14 1410  WBC 6.7  HGB 10.9*  HCT 34.7*  PLT 164    Recent Labs  08/02/14 1410  NA 140  K 3.7  CL 99  GLUCOSE 121*  BUN 14  CREATININE 0.68  CALCIUM 9.0   CBG (last 3)  No results found for this basename: GLUCAP,  in the last 72 hours  Wt Readings from Last 3 Encounters:  08/04/14 75.751 kg (167 lb)  07/26/14 62.1 kg (136 lb 14.5 oz)  07/26/14 62.1 kg (136 lb 14.5 oz)    Physical Exam:  Constitutional: She is oriented to person, place, and time. She appears well-developed and well-nourished.  No distress HENT: oral mucosa pink, dentition good  Head: Normocephalic and atraumatic.  Eyes: Conjunctivae are normal. Pupils are equal, round, and reactive to light.  Neck: Normal range of motion. Neck supple.  Cardiovascular: Normal rate and regular rhythm. No murmurs  Respiratory: Effort normal and breath sounds normal. No respiratory distress. She has no wheezes.  GI: Soft. Bowel sounds are normal. She exhibits no distension. There is no tenderness.  Neurological: She is alert and oriented to person, place, and time. Mild sensory loss over right foot and ulnar hand is persistent---otherwise normal sensory exam. Strength grossly 4+/5 in bilateral UE's. LE's 4/5 HF and Knees, 4/5 ankles.. Normal rom and fmc. Cognitively displays good insight and awareness.  Skin: Skin is warm and dry.   Psychiatric: Her speech is normal and behavior is normal. Judgment normal. Her mood is slightly anxious. Slow to respond to questions , flat affect  .    Assessment/Plan: 1. Functional deficits secondary to gait disorder/deconditioning which require 3+ hours per day of interdisciplinary therapy in a comprehensive inpatient rehab setting. Physiatrist is providing close team supervision and 24 hour management of active medical problems listed below. Physiatrist and rehab team continue to assess barriers to discharge/monitor patient progress toward functional and medical goals. FIM: FIM - Bathing Bathing Steps Patient Completed: Chest;Right Arm;Left Arm;Abdomen;Front perineal area;Buttocks;Right upper leg;Left upper leg;Right lower leg (including foot);Left lower leg (including foot) Bathing: 5: Supervision: Safety issues/verbal cues  FIM - Upper Body Dressing/Undressing Upper body dressing/undressing steps patient completed: Thread/unthread right sleeve of pullover shirt/dresss;Thread/unthread left sleeve of pullover shirt/dress;Put head through opening of pull over shirt/dress;Pull shirt over trunk Upper body dressing/undressing: 5: Set-up assist to: Obtain clothing/put away FIM - Lower Body Dressing/Undressing Lower body dressing/undressing steps patient completed: Don/Doff right sock;Don/Doff left sock Lower body dressing/undressing: 5: Set-up assist to: Don/Doff TED stocking  FIM - Toileting Toileting steps completed by patient: Adjust clothing prior to toileting;Adjust clothing after toileting;Performs perineal hygiene Toileting: 6: More than reasonable amount of time  FIM - Radio producer Devices: Mining engineer Transfers: 4-To toilet/BSC: Min A (steadying Pt. > 75%);4-From toilet/BSC: Min A (steadying Pt. > 75%)  FIM - Bed/Chair Transfer Bed/Chair Transfer Assistive Devices: Bed rails Bed/Chair Transfer: 5: Bed > Chair or W/C:  Supervision  (verbal cues/safety issues);5: Chair or W/C > Bed: Supervision (verbal cues/safety issues);6: Supine > Sit: No assist;6: Sit > Supine: No assist  FIM - Locomotion: Wheelchair Distance: 150 Locomotion: Wheelchair: 5: Travels 150 ft or more: maneuvers on rugs and over door sills with supervision, cueing or coaxing FIM - Locomotion: Ambulation Locomotion: Ambulation Assistive Devices: Administrator Ambulation/Gait Assistance: 5: Supervision Locomotion: Ambulation: 2: Travels 50 - 149 ft with supervision/safety issues  Comprehension Comprehension Mode: Auditory Comprehension: 6-Follows complex conversation/direction: With extra time/assistive device  Expression Expression Mode: Verbal Expression: 6-Expresses complex ideas: With extra time/assistive device  Social Interaction Social Interaction: 6-Interacts appropriately with others with medication or extra time (anti-anxiety, antidepressant).  Problem Solving Problem Solving: 5-Solves complex 90% of the time/cues < 10% of the time  Memory Memory: 5-Recognizes or recalls 90% of the time/requires cueing < 10% of the time  Medical Problem List and Plan:  1. Functional deficits secondary to Gait disorder due to multiple medical and psychosocial issues---most notably her GI blood loss and loss of husband.  2. DVT Prophylaxis/Anticoagulation: SCD's, ambulation 3. Back pain/fibromyalgia/R>L Knee OA  Pain Management: Will change   percocet  To home dose Continue lidocaine patch, Lyrica , reduce elavil.   Off savella.  4. Depression with anxiety/Grief reaction/Mood: D/C paxil since she is on antidepressant doses of trazodone and elavil with xanax prn. Team to provide ego support. Will have LCSW follow for support and evaluation. Neuropsych to follow up for support  5. Neuropsych: This patient is capable of making decisions on her own behalf.  6. Skin/Wound Care: Routine pressure relief measures. Added nutritional supplement.  7. Gastritis:  Continue PPI. Serial check of H/H. Off  ASA. GI to determine restart date 8. Acute on chronic anemia: Iron supplemented and to follow up with hem/onc past discharge. Continue folic acid.  9. Dizziness: Check orthostatic blood pressures.  Reduce elavil, check BMET and CBC TED and binder to help with symptoms. H/o BPPV in the past.  10 Right petrous bone mass: will need follow up MRI for work up.  11. Chronic insomnia: CPAP not functional---will need to follow up with pulmonary to reschedule study. Continue trazodone at bedtime. --increased to 100-200mg  qhs per home routine 12. Non-obst CAD with angina: continue ASA with bystolic daily. Off imdur d/c due to orthostatic changes. Monitor for symptoms with increase in activity.  2. [name] with asthma: Oxygen dependent. Albuterol  prn.  14.  Increased response latency- mood disorder vs medication effect vs early dementia- neuropsych eval, simplify med regimen, ask psychiatry to eval, discussed with pt who is ok with this LOS (Days) 9 A FACE TO FACE EVALUATION WAS PERFORMED```  KIRSTEINS,ANDREW E 08/04/2014 9:59 AM

## 2014-08-04 NOTE — Consult Note (Signed)
[facility] Consult   Reason for Consult:  Depression, anxiety and medication managemnt Referring Physician:  Dr. Livingston [name] is an 68 y.o. female. Total Time spent with patient: 45 minutes  Assessment: AXIS I:  Generalized Anxiety Disorder and Major Depression, Recurrent severe AXIS II:  Deferred AXIS III:  Multiple medical problems as listed in H&P AXIS IV:  other psychosocial or environmental problems, problems related to social environment and problems with primary support group AXIS V:  51-60 moderate symptoms  Plan:  Case discussed with Dr. Pecola Leisure Patient has capacity to make her own medical decisions and living arrangements Change Trazodone 150 mg PO Qhs for insomnia Increase Cymbalta 40 mg Qam and titrate to 60 mg if clinically required for depression and pain Continue Klonopin 0.5 mg PO BID for anxiety Discontinue Neurontin 300 mg PO BID because patient prefers to continue with Lyrica for chronic fibromyalgia pains. No evidence of imminent risk to self or others at present.   Patient does not meet criteria for psychiatric inpatient admission. Supportive therapy provided about ongoing stressors. Discussed crisis plan, support from social network, calling 911, coming to the Emergency Department, and calling Suicide Hotline. Appreciate psychiatric consultation and follow up as clinically required Please contact [phone] if needs further assistance  Subjective:   Linda [name] is a 68 y.o. female patient admitted with depression.  HPI:  Patient is seen, chart reviewed and case discussed with Dr. Pecola Leisure. Patient has been depressed, anxious since her husband passed away in 17-Apr-2014 and also has multiple medical condition including chronic back pain. Patient stated that she was taking paxil and xanax but has limited benefits. She has requested to change her medication for better control of her symptoms. She is feeling alone, tired, disturbed sleep, appetite  and has difficult to take care of her multiple health problems. She is thinking about getting home health care or nursing home placement upon discharge from the hospital. She has intact cognition except some memory deficits due to being depressed. She has denied suicidal or homicidal ideation, intention or plans. She is a retired Education officer, museum and has a son with MS and two grand children, relocated from [place], [place]. She does not want to be burden to her son and she feels she does not wants to move to his home, instead may choose to go to SNF with rehab treatment if needed.   Interval history: Patient is seen today for psychiatric consultation followup and patient has 3 family members and social worker in her room discussing about disposition plans. Patient reported to continue to feed depression, anxiety about leaving the hospital, chronic pains and has been compliant with her medication management and denied any side effects from the antidepressant medication. Patient reported she did not see any benefits at this time. Patient and patient's family is willing to adjust her medication for better control of depression, anxiety and insomnia. Patient continued to endorse no suicidal or homicidal ideation. We'll increase her medication trazodone 250 mg tonight for better sleep and Cymbalta 40 mg daily for depression, continue clonazepam 0.5 mg 2 times a day for anxiety.    Medical history: Linda [name] is a 68 y.o. female with history of HTN, asbestosis--oxygen depenedent, chronic pain, anemia, recent UTI who was admitted on 07/17/14 with orthostatic episodes for past 2 weeks with onset of nose bleed X 2 days with multiple falls. She on chronic coumadin, plavix and meloxicam and was found to be anemic with Hgb 5.1  and INR >10.0. CT head with right petrous bone mass and CT abdomen/pelvis without retroperitoneal hematoma. She was transfused with 2 units PRBC and treated with FFP and Vit K. Patient placed on  IV PPI and underwent EGD revealing gastritis as well as biopsy of gastric nodule on 07/28. Pathology with reactive gastropathy. Hem/Onc consulted for input on need for anticoagulation and felt that coumadin not needed as CT chest earlier this year as well as BLE dopplers negative. Dr. Terrence Dupont consulted for input and recommended low dose ASA once cleared per GI. Palliative care consulted for support due to grief/loss of husband as well as anxiety/agitation/pain issues and provided ego support as well as Resources on support groups. PT ongoing and patient continues to be limited by deconditioning as well as orthostatic symptoms.    HPI Elements:   Location:  depression, anxiety. Quality:  poor. Severity:  acute. Timing:  loss of husband. Duration:  four months.  Past Psychiatric History:  reports that she has never smoked. She has never used smokeless tobacco. She reports that she does not drink alcohol or use illicit drugs. Family History  Problem Relation Age of Onset  . Arthritis Mother   . Kidney disease Mother   . Heart disease Father   . Stroke Father   . Hypertension Father   . Diabetes Father      Living Arrangements: Alone   Abuse/Neglect [facility]) Physical Abuse: Denies Verbal Abuse: Denies Sexual Abuse: Denies Allergies:   Allergies  Allergen Reactions  . Penicillins Itching, Swelling and Rash    Tolerated Cefepime in ED.    ACT Assessment Complete:  NO Objective: Blood pressure 136/60, pulse 83, temperature 98.6 F (37 C), temperature source Oral, resp. rate 18, weight 75.751 kg (167 lb), SpO2 98.00%.Body mass index is 31.57 kg/(m^2). Results for orders placed during the hospital encounter of 07/26/14 (from the past 72 hour(s))  BASIC METABOLIC PANEL     Status: Abnormal   Collection Time    08/02/14  2:10 PM      Result Value Ref Range   Sodium 140  137 - 147 mEq/L   Potassium 3.7  3.7 - 5.3 mEq/L   Chloride 99  96 - 112 mEq/L   CO2 26  19 - 32 mEq/L   Glucose,  Bld 121 (*) 70 - 99 mg/dL   BUN 14  6 - 23 mg/dL   Creatinine, Ser 0.68  0.50 - 1.10 mg/dL   Calcium 9.0  8.4 - 10.5 mg/dL   GFR calc non Af Amer 88 (*) >90 mL/min   GFR calc Af Amer >90  >90 mL/min   Comment: (NOTE)     The eGFR has been calculated using the CKD EPI equation.     This calculation has not been validated in all clinical situations.     eGFR's persistently <90 mL/min signify possible Chronic Kidney     Disease.   Anion gap 15  5 - 15  CBC     Status: Abnormal   Collection Time    08/02/14  2:10 PM      Result Value Ref Range   WBC 6.7  4.0 - 10.5 K/uL   RBC 3.66 (*) 3.87 - 5.11 MIL/uL   Hemoglobin 10.9 (*) 12.0 - 15.0 g/dL   HCT 34.7 (*) 36.0 - 46.0 %   MCV 94.8  78.0 - 100.0 fL   MCH 29.8  26.0 - 34.0 pg   MCHC 31.4  30.0 - 36.0 g/dL  RDW 14.7  11.5 - 15.5 %   Platelets 164  150 - 400 K/uL   Labs are reviewed and are pertinent for as above.  Current Facility-Administered Medications  Medication Dose Route Frequency Provider Last Rate Last Dose  . albuterol (PROVENTIL) (2.5 MG/3ML) 0.083% nebulizer solution 2.5 mg  2.5 mg Nebulization Q6H PRN Bary Leriche, PA-C      . ALPRAZolam Duanne Moron) tablet 0.5-1 mg  0.5-1 mg Oral TID PRN Bary Leriche, PA-C   1 mg at 08/03/14 2236  . alum & mag hydroxide-simeth (MAALOX/MYLANTA) 200-200-20 MG/5ML suspension 30 mL  30 mL Oral Q4H PRN Ivan Anchors Love, PA-C      . amitriptyline (ELAVIL) tablet 50 mg  50 mg Oral QHS Charlett Blake, MD   50 mg at 08/03/14 2237  . atorvastatin (LIPITOR) tablet 20 mg  20 mg Oral q1800 Ivan Anchors Love, PA-C   20 mg at 08/03/14 1722  . bisacodyl (DULCOLAX) suppository 10 mg  10 mg Rectal Daily PRN Bary Leriche, PA-C      . diphenhydrAMINE (BENADRYL) 12.5 MG/5ML elixir 12.5-25 mg  12.5-25 mg Oral Q6H PRN Bary Leriche, PA-C      . folic acid (FOLVITE) tablet 2 mg  2 mg Oral Daily Pamela S Love, PA-C   2 mg at 08/04/14 1306  . guaiFENesin-dextromethorphan (ROBITUSSIN DM) 100-10 MG/5ML syrup 5-10 mL   5-10 mL Oral Q6H PRN Bary Leriche, PA-C      . hydrocerin (EUCERIN) cream   Topical BID Ivan Anchors Love, PA-C      . nebivolol (BYSTOLIC) tablet 5 mg  5 mg Oral Daily Bary Leriche, PA-C   5 mg at 08/04/14 1305  . nitroGLYCERIN (NITROSTAT) SL tablet 0.4 mg  0.4 mg Sublingual Q5 Min x 3 PRN Bary Leriche, PA-C      . ondansetron [facility]) tablet 4 mg  4 mg Oral Q6H PRN Bary Leriche, PA-C   4 mg at 07/26/14 2121   Or  . ondansetron (ZOFRAN) injection 4 mg  4 mg Intravenous Q6H PRN Bary Leriche, PA-C      . oxyCODONE-acetaminophen (PERCOCET/ROXICET) 5-325 MG per tablet 2 tablet  2 tablet Oral Q8H PRN Charlett Blake, MD   2 tablet at 08/04/14 1312  . pantoprazole (PROTONIX) EC tablet 40 mg  40 mg Oral Q0600 Ivan Anchors Love, PA-C   40 mg at 08/04/14 4680  . polyethylene glycol (MIRALAX / GLYCOLAX) packet 17 g  17 g Oral BID Bary Leriche, PA-C   17 g at 07/31/14 0817  . pregabalin (LYRICA) capsule 200 mg  200 mg Oral Daily Bary Leriche, PA-C   200 mg at 08/04/14 1305  . pregabalin (LYRICA) capsule 400 mg  400 mg Oral QHS Ivan Anchors Love, PA-C   400 mg at 08/03/14 2237  . senna-docusate (Senokot-S) tablet 2 tablet  2 tablet Oral BID Bary Leriche, PA-C   2 tablet at 08/02/14 3212  . traMADol (ULTRAM) tablet 50 mg  50 mg Oral Q6H PRN Bary Leriche, PA-C   50 mg at 08/03/14 0813  . traZODone (DESYREL) tablet 100-200 mg  100-200 mg Oral QHS Meredith Staggers, MD   200 mg at 08/03/14 2236    Psychiatric Specialty Exam: Physical Exam Full physical performed in Emergency Department. I have reviewed this assessment and concur with its findings.   Review of Systems  Respiratory: Positive for wheezing.   Gastrointestinal: Positive for  abdominal pain.  Musculoskeletal: Positive for back pain.  Psychiatric/Behavioral: Positive for depression and memory loss. The patient is nervous/anxious and has insomnia.     Blood pressure 136/60, pulse 83, temperature 98.6 F (37 C), temperature source Oral, resp. rate  18, weight 75.751 kg (167 lb), SpO2 98.00%.Body mass index is 31.57 kg/(m^2).  General Appearance: Casual  Eye Contact::  Good  Speech:  Clear and Coherent and Slow  Volume:  Decreased  Mood:  Anxious and Depressed  Affect:  Depressed and Flat  Thought Process:  Coherent and Goal Directed  Orientation:  Full (Time, Place, and Person)  Thought Content:  WDL  Suicidal Thoughts:  No  Homicidal Thoughts:  No  Memory:  Immediate;   Fair Recent;   Fair  Judgement:  Intact  Insight:  Good  Psychomotor Activity:  Psychomotor Retardation  Concentration:  Fair  Recall:  Good  Fund of Knowledge:Good  Language: NA  Akathisia:  NA  Handed:  Right  AIMS (if indicated):     Assets:  Communication Skills Desire for Improvement Financial Resources/Insurance Housing Leisure Time Resilience Transportation  Sleep:      Musculoskeletal: Strength & Muscle Tone: decreased Gait & Station: unable to stand Patient leans: N/A  Treatment Plan Summary: Daily contact with patient to assess and evaluate symptoms and progress in treatment Medication management as noted in plan and psych consult will follow up as clinically required.   Javone Ybanez,JANARDHAHA R. 08/04/2014 1:37 PM

## 2014-08-05 ENCOUNTER — Inpatient Hospital Stay (HOSPITAL_COMMUNITY): Payer: Medicare Other

## 2014-08-05 ENCOUNTER — Other Ambulatory Visit: Payer: Medicare Other | Admitting: Lab

## 2014-08-05 ENCOUNTER — Ambulatory Visit: Payer: Medicare Other | Admitting: Family

## 2014-08-05 ENCOUNTER — Encounter (HOSPITAL_COMMUNITY): Payer: Medicare Other

## 2014-08-05 ENCOUNTER — Ambulatory Visit: Payer: Medicare Other

## 2014-08-05 DIAGNOSIS — F411 Generalized anxiety disorder: Secondary | ICD-10-CM | POA: Diagnosis present

## 2014-08-05 DIAGNOSIS — F332 Major depressive disorder, recurrent severe without psychotic features: Secondary | ICD-10-CM | POA: Diagnosis present

## 2014-08-05 MED ORDER — DULOXETINE HCL 20 MG PO CPEP
40.0000 mg | ORAL_CAPSULE | Freq: Every day | ORAL | Status: DC
Start: 2014-08-06 — End: 2014-08-08
  Administered 2014-08-06 – 2014-08-08 (×3): 40 mg via ORAL
  Filled 2014-08-05 (×4): qty 2

## 2014-08-05 MED ORDER — TRIAMCINOLONE ACETONIDE 0.1 % EX CREA
TOPICAL_CREAM | Freq: Every day | CUTANEOUS | Status: DC
  Administered 2014-08-05 – 2014-08-08 (×4): via TOPICAL
  Filled 2014-08-05: qty 15

## 2014-08-05 MED ORDER — OXYCODONE-ACETAMINOPHEN 5-325 MG PO TABS
1.0000 | ORAL_TABLET | Freq: Three times a day (TID) | ORAL | Status: DC | PRN
  Administered 2014-08-05 – 2014-08-08 (×4): 1 via ORAL
  Filled 2014-08-05 (×6): qty 1

## 2014-08-05 MED ORDER — ISOSORBIDE MONONITRATE 15 MG HALF TABLET
15.0000 mg | ORAL_TABLET | Freq: Every day | ORAL | Status: DC
  Administered 2014-08-05 – 2014-08-08 (×4): 15 mg via ORAL
  Filled 2014-08-05 (×5): qty 1

## 2014-08-05 MED ORDER — SENNOSIDES-DOCUSATE SODIUM 8.6-50 MG PO TABS
2.0000 | ORAL_TABLET | Freq: Every day | ORAL | Status: DC
  Filled 2014-08-05: qty 2

## 2014-08-05 MED ORDER — BETAMETHASONE VALERATE 0.1 % EX LOTN
TOPICAL_LOTION | Freq: Every day | CUTANEOUS | Status: DC
  Filled 2014-08-05: qty 60

## 2014-08-05 MED ORDER — TRAZODONE HCL 50 MG PO TABS
150.0000 mg | ORAL_TABLET | Freq: Every day | ORAL | Status: DC
  Administered 2014-08-05 – 2014-08-07 (×3): 150 mg via ORAL
  Filled 2014-08-05 (×4): qty 3

## 2014-08-05 NOTE — Discharge Summary (Signed)
Physician Discharge Summary  Patient ID: Linda [name] MRN: [id number] DOB/AGE: [birth year] 68 y.o.  Admit date: 07/26/2014 Discharge date: 08/08/2014  Discharge Diagnoses:  Principal Problem:   Multifactorial gait disorder Active Problems:   HTN (hypertension), benign   Insomnia   OSA (obstructive sleep apnea)   Grief   DDD (degenerative disc disease)   Anemia associated with acute blood loss   GI bleed   Chronic respiratory failure   Generalized anxiety disorder--with occasional panic attacks.    Recurrent major depression-severe   Discharged Condition: Stable.   Significant Diagnostic Studies:  Ct Head Wo Contrast   07/17/2014   CLINICAL DATA:  Syncopal episodes with loss of consciousness, dizziness, history of migraines  EXAM: CT HEAD WITHOUT CONTRAST  TECHNIQUE: Contiguous axial images were obtained from the base of the skull through the vertex without intravenous contrast.  COMPARISON:  01/30/2013  FINDINGS: Calvarium is intact. No significant inflammatory change in the visualized portions of the paranasal sinuses.  Mild age-related atrophy. No evidence of hemorrhage or extra-axial fluid. No evidence of infarct or hydrocephalus.  There is a suggestion of an extra-axial mass near the right petrous bone measuring about 13 mm. This appears contiguous however within overlying gyrus and an identical appearance is identified on 12/14/2012.  IMPRESSION: Suggestion of mass arising off the right petrous bone is likely due to volume averaging with a prominent overlying gyrus of right cerebellum, as the appearance is very similar to 12/14/2012. MRI should be considered to confirm this however.   Electronically Signed   By: Skipper Cliche M.D.   On: 07/17/2014 19:55    Labs:  Basic Metabolic Panel:  Recent Labs Lab 08/02/14 1410  NA 140  K 3.7  CL 99  CO2 26  GLUCOSE 121*  BUN 14  CREATININE 0.68  CALCIUM 9.0    CBC: CBC Latest Ref Rng 08/02/2014 07/27/2014 07/26/2014  WBC 4.0  - 10.5 K/uL 6.7 6.3 6.2  Hemoglobin 12.0 - 15.0 g/dL 10.9(L) 11.4(L) 10.1(L)  Hematocrit 36.0 - 46.0 % 34.7(L) 36.0 32.5(L)  Platelets 150 - 400 K/uL 164 PLATELET CLUMPS NOTED ON SMEAR, COUNT APPEARS ADEQUATE 191     CBG: No results found for this basename: GLUCAP,  in the last 168 hours  Brief HPI:   Linda [name] is a 68 y.o. female with history of orthostatic hypotension, asbestosis--oxygen depenedent, chronic pain, anemia, recent UTI who was admitted on 07/17/14 with recurrent orthostatic episodes X 2 weeks, nose bleeds as well as multiple falls. She had been on chronic coumadin, plavix and meloxicam and was found to be anemic with Hgb 5.1 and INR >10.0. CT head with right petrous bone mass and CT abdomen/pelvis without retroperitoneal hematoma. She was transfused with 2 units PRBC and coumadin reversed with FFP and Vit K. She was placed on IV PPI and underwent EGD revealing gastritis as well as gastric nodule on 07/28. Pathology showed reactive gastropathy. Hem/Onc consulted for input on need for anticoagulation and felt that coumadin was not needed as CT chest earlier this year as well as BLE dopplers negative. Dr. Terrence Dupont consulted for input and recommended low dose ASA once cleared per GI. Palliative care consulted for support due to grief/loss of husband as well as anxiety/agitation/pain issues and is providing ego support as well as Resources on support groups. Patient continues to be limited by deconditioning as well as orthostatic symptoms and CIR recommended for follow up therapy.    Hospital Course: Linda [name] was admitted to rehab  07/26/2014 for inpatient therapies to consist of PT, ST and OT at least three hours five days a week. Past admission physiatrist, therapy team and rehab RN have worked together to provide customized collaborative inpatient rehab. Blood pressures were monitored every 8 hours and were controlled but patient has had issue with orthostatic changes.  Medications were tapered off to prevent dizziness as well as near syncope symptoms. Blood pressures have started to stabilize and trend upward therefore low dose Imdur was resumed. TEDs as well as abdominal binder were used to prevent symptoms when when out of bed. H/H has been monitored with serial checks and has shown improvement to 10.9. Renal status has been stable and po intake is slowly improving. She has had hypoxia with activity and has been encouraged to wear oxygen at all times. She has had issues with insomnia likely exacerbated by OSA. She has used CPAP in the past and reported that this has helped with sleep hygiene. She was encouraged to follow up with pulmonary for sleep study past discharge. Pain medications were tapered due to multiple family concerns. Patient reported increase in pain affecting her overall participation and tolerance of therapy.  Percocet was adjusted to 10/325 mg every 8 hours prn.    Psychiatry was consulted for input on medications as patient continue to endorse depressed mood with anxiety and requested a change her medication regimen. Dr. Louretta Shorten evaluated patient with assessment revealing recurrent severe major depression and generalized anxiety disorder. Patient has capacity to make medical decisions and living arrangement. He recommended Cymbalta 40 mg for fibromyalgia as well as mood stabilization as well as  changing xanax to Klonopin bid.  Klonopin has been ineffective in controlling anxiety levels therefore xanax was resumed at discharge. Dr. Pollard/psychologist has also followed for support and cognitive evaluation. Patient reported subjective reports of problems with memory and was also exhibiting clinically significant depression and anxiety likely due to bereavement, current illness and  long standing issues. Patient was encouraged to think about psychotherapy but she was adamantly opposed to this for fear that her family would find out and judge her for it.  She has been given resources for follow up with grief group at [facility] and Palliative care  as well as contact information for Dr. Beverly Gust.   Patient has made slow steady progress during her rehab stay. She continue to require min assist for mobility and has elected on SNF for further therapies. Bed is available at [facility]  and patient is discharged to SNF on 08/08/14.   Rehab course: During patient's stay in rehab weekly team conferences were held to monitor patient's progress, set goals and discuss barriers to discharge. Patient has had improvement in activity tolerance, balance, postural control, as well as ability to compensate for deficits. She is able to complete bathing and dressing tasks with supervision as well as multiple rest breaks. She requires supervision for transfers and is able to ambulate 60 feet with min guard assist and one rest break due to orthostatic symptoms.    Disposition:  Skilled Nursing Facility  Diet:  Heart Healthy  Special Instructions: 1. Wear oxygen at all times 2. Follow up with Dr. Fuller Plan for input on resuming ASA. 3. Continue to use TEDs as well as Abdominal binder when out of bed. Doff /loosen binder when seated.  4. Follow up with MD to set sleep study.     Medication List    STOP taking these medications       amitriptyline 100 MG  tablet  Commonly known as:  ELAVIL     aspirin EC 81 MG tablet     furosemide 20 MG tablet  Commonly known as:  LASIX     PARoxetine 20 MG tablet  Commonly known as:  PAXIL      TAKE these medications       albuterol 108 (90 BASE) MCG/ACT inhaler  Commonly known as:  PROVENTIL HFA;VENTOLIN HFA  Inhale 2 puffs into the lungs every 6 (six) hours as needed for wheezing or shortness of breath.     albuterol (2.5 MG/3ML) 0.083% nebulizer solution  Commonly known as:  PROVENTIL  Take 3 mLs (2.5 mg total) by nebulization every 6 (six) hours as needed for wheezing or shortness of breath.     ALPRAZolam 1 MG  tablet---Rx # 15 pills   Commonly known as:  XANAX  Take 1 tablet (1 mg total) by mouth 3 (three) times daily.     atorvastatin 20 MG tablet  Commonly known as:  LIPITOR  Take 1 tablet (20 mg total) by mouth daily at 6 PM.     CENTRUM SILVER tablet  Take 1 tablet by mouth daily.     DULoxetine HCl 40 MG Cpep  Take 40 mg by mouth daily.     folic acid 1 MG tablet  Commonly known as:  FOLVITE  Take 2 tablets (2 mg total) by mouth daily.     hydrocerin Crea  Apply 1 application topically 2 (two) times daily. To dry flaky areas     isosorbide mononitrate 15 mg Tb24 24 hr tablet  Commonly known as:  IMDUR  Take 0.5 tablets (15 mg total) by mouth daily.     nebivolol 5 MG tablet  Commonly known as:  BYSTOLIC  Take 5 mg by mouth daily.     nitroGLYCERIN 0.4 MG SL tablet  Commonly known as:  NITROSTAT  Place 1 tablet (0.4 mg total) under the tongue every 5 (five) minutes x 3 doses as needed for chest pain.     Oxycodone HCl 10 MG Tabs--Rx # 15 pills   Take 1 tablet (10 mg total) by mouth every 8 (eight) hours as needed for severe pain.     pantoprazole 40 MG tablet  Commonly known as:  PROTONIX  Take 1 tablet (40 mg total) by mouth daily at 6 (six) AM.     polyethylene glycol packet  Commonly known as:  MIRALAX / GLYCOLAX  Take 17 g by mouth daily.     pregabalin 200 MG capsule--Rx # 15 pills   Commonly known as:  LYRICA  Take 200 mg capsule in the morning and 400mg  at bedtime     senna-docusate 8.6-50 MG per tablet  Commonly known as:  Senokot-S  Take 2 tablets by mouth at bedtime.     traMADol 50 MG tablet--Rx # 30 pill s  Commonly known as:  ULTRAM  Take 1 tablet (50 mg total) by mouth every 6 (six) hours as needed for moderate pain.     traZODone 50 MG tablet  Commonly known as:  DESYREL  Take 3 tablets (150 mg total) by mouth at bedtime.     triamcinolone cream 0.1 %  Commonly known as:  KENALOG  Apply topically daily.       Follow-up Information    Follow up with Volanda Napoleon, MD On 08/24/2014. (@ 11:15 AM)    Specialty:  Oncology   Contact information:   [address]  [place] [address] [phone]       Follow up with Tammi Sou, MD On 08/22/2014. (@ 2:30 pm for post hospitalization follow up visit and work up of petrous bone mass )    Specialty:  Family Medicine   Contact information:   [address] [place] [address] [phone]       Follow up with Meredith Staggers, MD On 09/27/2014. (Be there at 10 am for 10:30 am post hospital/pain management set up.  )    Specialty:  Physical Medicine and Rehabilitation   Contact information:   [address], [place] [address] [phone]       Follow up with Norberto Sorenson T. Fuller Plan, MD. Call today. (for follow up on gastritis. )    Specialty:  Gastroenterology   Contact information:   [address] [place] [address] [phone]       Signed: Bary Leriche 08/08/2014, 11:06 AM

## 2014-08-05 NOTE — Progress Notes (Signed)
Occupational Therapy Session Note  Patient Details  Name: Linda [name] MRN: [id number] Date of Birth: 07-12-1946  Today's Date: 08/05/2014 Time: 0900-1000 Time Calculation (min): 60 min  Short Term Goals: Week 2:  OT Short Term Goal 1 (Week 2): Pt will demo improved awareness of compensatory strategies to reduce episodes of orthostatic hypotension during ADL OT Short Term Goal 2 (Week 2): Pt will complete lower body dressing using AE, prn, with setup assist OT Short Term Goal 3 (Week 2): Pt will don/doff abdominal binder with setup assist  Skilled Therapeutic Interventions/Progress Updates:    Pt engaged in BADL retraining with primary focus on activity tolerance, functional amb with RW, transfers, sit<>stand, dynamic standing balance, and safety awareness.  Pt exhibited 2 episodes of "everything getting dark" and knees getting"weak."  When standing in shower pt sat down and recalled that she had been instructed to sit down when she felt this way.  Pt completed bathing and dressing tasks at supervision level requiring min verbal cues for safety awareness.  Pt did required min A when ambulating (for safety). Pt's daughter present sporadically during session to observe but not proved any supervision or assistance.  Reeducated patient and daughter on recommendation for 24 hour supervision.  Both patient and daughter verbalized understanding.   Therapy Documentation Precautions:  Precautions Precautions: Fall Precaution Comments: 3 L/min O2 with activity Required Braces or Orthoses: Other Brace/Splint Other Brace/Splint: abdominal binder Restrictions Weight Bearing Restrictions: No   Pain: Pain Assessment Pain Assessment: No/denies pain Pain Score: 5  Faces Pain Scale: Hurts a little bit Pain Location: Back Pain Orientation: Lower Pain Descriptors / Indicators: Aching Patients Stated Pain Goal: 2 Pain Intervention(s): RN aware; Repositioned Multiple Pain Sites: Yes 2nd Pain  Site Pain Score: 6 Pain Type: Chronic pain Pain Location: Leg Pain Orientation: Right Pain Descriptors / Indicators: Aching Pain Frequency: Constant Pain Onset: Gradual Patient's Stated Pain Goal: 2 Pain Intervention(s): RN aware; Repositioned  See FIM for current functional status  Therapy/Group: Individual Therapy  Leroy Libman 08/05/2014, 10:41 AM

## 2014-08-05 NOTE — Progress Notes (Addendum)
[facility]IC[facility]ABILITATION     PROGRESS NOTE    Subjective/Complaints: Appreciate psychiatry and neuropsych note Discussed med changes Discussed BP Review of Systems - Negative except dizziness Objective: Vital Signs: Blood pressure 172/69, pulse 73, temperature 98.3 F (36.8 C), temperature source Oral, resp. rate 17, weight 78.019 kg (172 lb), SpO2 98.00%. No results found.  Recent Labs  08/02/14 1410  WBC 6.7  HGB 10.9*  HCT 34.7*  PLT 164    Recent Labs  08/02/14 1410  NA 140  K 3.7  CL 99  GLUCOSE 121*  BUN 14  CREATININE 0.68  CALCIUM 9.0   CBG (last 3)  No results found for this basename: GLUCAP,  in the last 72 hours  Wt Readings from Last 3 Encounters:  08/05/14 78.019 kg (172 lb)  07/26/14 62.1 kg (136 lb 14.5 oz)  07/26/14 62.1 kg (136 lb 14.5 oz)    Physical Exam:  Constitutional: She is oriented to person, place, and time. She appears well-developed and well-nourished.  No distress HENT: oral mucosa pink, dentition good  Head: Normocephalic and atraumatic.  Eyes: Conjunctivae are normal. Pupils are equal, round, and reactive to light.  Neck: Normal range of motion. Neck supple.  Cardiovascular: Normal rate and regular rhythm. No murmurs  Respiratory: Effort normal and breath sounds normal. No respiratory distress. She has no wheezes.  GI: Soft. Bowel sounds are normal. She exhibits no distension. There is no tenderness.  Neurological: She is alert and oriented to person, place, and time. Mild sensory loss over right foot and ulnar hand is persistent---otherwise normal sensory exam. Strength grossly 4+/5 in bilateral UE's. LE's 4/5 HF and Knees, 4/5 ankles.. Normal rom and fmc. Cognitively displays good insight and awareness.  Skin: Skin is warm and dry.eczematous patches  Psychiatric: Her speech is normal and behavior is normal. Judgment normal. Her mood is slightly anxious. Slow to respond to questions , flat affect   .    Assessment/Plan: 1. Functional deficits secondary to gait disorder/deconditioning which require 3+ hours per day of interdisciplinary therapy in a comprehensive inpatient rehab setting. Physiatrist is providing close team supervision and 24 hour management of active medical problems listed below. Physiatrist and rehab team continue to assess barriers to discharge/monitor patient progress toward functional and medical goals.  Spoke at length with daughter who is visitn from [place] Discussed meds , hypotension which appears to be related to Elavil (now D/C) She related hx of xanax and hydrocodone addiction, currently on low dose oxy, would not have withdrawal off current dose, cont wean FIM: FIM - Bathing Bathing Steps Patient Completed: Chest;Right Arm;Left Arm;Abdomen;Buttocks;Front perineal area;Right upper leg;Left upper leg Bathing: 5: Set-up assist to: Obtain items  FIM - Upper Body Dressing/Undressing Upper body dressing/undressing steps patient completed: Thread/unthread right bra strap;Thread/unthread left bra strap;Hook/unhook bra;Thread/unthread right sleeve of pullover shirt/dresss;Thread/unthread left sleeve of pullover shirt/dress;Put head through opening of pull over shirt/dress;Pull shirt over trunk Upper body dressing/undressing: 5: Set-up assist to: Obtain clothing/put away FIM - Lower Body Dressing/Undressing Lower body dressing/undressing steps patient completed: Thread/unthread right underwear leg;Thread/unthread left underwear leg;Pull underwear up/down;Thread/unthread right pants leg;Thread/unthread left pants leg;Pull pants up/down Lower body dressing/undressing: 4: Min-Patient completed 75 plus % of tasks  FIM - Toileting Toileting steps completed by patient: Adjust clothing prior to toileting;Performs perineal hygiene;Adjust clothing after toileting Toileting: 5: Supervision: Safety issues/verbal cues  FIM - Radio producer Devices:  Grab bars Toilet Transfers: 4-To toilet/BSC: Min A (steadying Pt. >  75%)  FIM - Bed/Chair Transfer Bed/Chair Transfer Assistive Devices: Arm rests;Walker Bed/Chair Transfer: 4: Chair or W/C > Bed: Min A (steadying Pt. > 75%);4: Bed > Chair or W/C: Min A (steadying Pt. > 75%)  FIM - Locomotion: Wheelchair Distance: 150 Locomotion: Wheelchair: 5: Travels 150 ft or more: maneuvers on rugs and over door sills with supervision, cueing or coaxing FIM - Locomotion: Ambulation Locomotion: Ambulation Assistive Devices: Administrator Ambulation/Gait Assistance: 4: Min guard;4: Min assist Locomotion: Ambulation: 2: Travels 50 - 149 ft with minimal assistance (Pt.>75%)  Comprehension Comprehension Mode: Auditory Comprehension: 5-Understands complex 90% of the time/Cues < 10% of the time  Expression Expression Mode: Verbal Expression: 5-Expresses complex 90% of the time/cues < 10% of the time  Social Interaction Social Interaction: 6-Interacts appropriately with others with medication or extra time (anti-anxiety, antidepressant).  Problem Solving Problem Solving: 5-Solves complex 90% of the time/cues < 10% of the time  Memory Memory: 5-Recognizes or recalls 90% of the time/requires cueing < 10% of the time  Medical Problem List and Plan:  1. Functional deficits secondary to Gait disorder due to multiple medical and psychosocial issues---most notably her GI blood loss and loss of husband.  2. DVT Prophylaxis/Anticoagulation: SCD's, ambulation 3. Back pain/fibromyalgia/R>L Knee OA  Pain Management: Will change   percocet  To home dose Continue lidocaine patch, Lyrica , reduce elavil.   Off savella.  4. Depression with anxiety/Grief reaction/Mood: D/C paxil since she is on antidepressant doses of trazodone and elavil with xanax prn. Team to provide ego support. Will have LCSW follow for support and evaluation. Neuropsych to follow up for support  5. Neuropsych: This patient is capable of  making decisions on her own behalf.  6. Skin/Wound Care: Routine pressure relief measures. Added nutritional supplement.  7. Gastritis: Continue PPI. Serial check of H/H. Off  ASA. GI to determine restart date 8. Acute on chronic anemia: Iron supplemented and to follow up with hem/onc past discharge. Continue folic acid.  9. Dizziness: Check orthostatic blood pressures.  off elavil, BPs now up H/o BPPV in the past but current sx not suggestive .  10 Right petrous bone mass: will need follow up MRI for work up.  11. Chronic insomnia: CPAP not functional---will need to follow up with pulmonary to reschedule study. Continue trazodone at bedtime. -- 100 qhs per home routine 12. Non-obst CAD with angina: continue ASA with bystolic daily. Off imdur d/c due to orthostatic changes, will resume now that BP is up. Monitor for symptoms with increase in activity.  41. [name] with asthma: Oxygen dependent. Albuterol  prn.  14.  Increased response latency- mood disorder per Neuropsych and Psychiatry     LOS (Days) 10 A FACE TO FACE EVALUATION WAS PERFORMED```  Ralphael [place]gate E 08/05/2014 7:55 AM

## 2014-08-05 NOTE — Progress Notes (Signed)
Physical Therapy Session Note  Patient Details  Name: Linda [name] MRN: [id number] Date of Birth: 18-Nov-1946  Today's Date: 08/05/2014 Time: 4132-4401 & [id number] Time Calculation (min): 62 min  Short Term Goals: Week 1:  PT Short Term Goal 1 (Week 1): STG=LTGs  Skilled Therapeutic Interventions/Progress Updates:   AM Session (60 min): Pt. Received in room with NT attending pt for dressing prior to therapy session. Pt. Agreeable to scheduled therapy. Physician consulted on skin "spots," indicated normal for allergic reaction to external source; not drug allergy. Helped pt.donn TED hose. Pt. Provided scheduled medication and maintained EOB postural control without UE support. Daughter present. 3L/min nasal canula donned continuously during session. TED hose and abdominal binder donned during session.  1:1 Treatment focused on ambulation, transfers, postural stability, and increased activity endurance. Pt. Assist level is min guard assist with RW use, and (S) with w/c mgmt. Pt. Provided different w/c to facilitate greater BLE use to allow BUE function use to simulate home tasks. Treatment consisted of family education on w/c use as safety device due to orthostasis with RW; pt ambulated 65 feet with RW and experienced one syncope episode at 40 feet that required static standing for increased tolerance; w/c and O2 tank managed by family members during ambulation. Pt. Ambulated additional distance of 25 feet and required seated rest break due to syncope. Therapist consulted SW post tx session to arrange assistive devices for pending D/C next week.  Pain:see below  Pt. Care handed-off to COTA for next session in therapy gym. Pt. Family present.   PM Session (60 min): Pt. Received semi-reclined in bed with Ted and abdominal binder donned on 3L/min O2. Pt. Agreeable to physical therapy session. Pt. Daughter present for start of session.  1:1 Treatment focused on ambulation, w/c propulsion  with BLEs only, and family education. Pt. Assist level is min guard to min A with RW due to syncope. Treatment consisted of precautions related to syncope with w/c use for safety and transfers. Pt. Travel 200 feet x 2 with simulated BUE use for carrying objects, moving around controled environments with obstacles, with cues for proper technique and positioning for safety. Pt. Ambulated 65 feet with one standing rest/recovery break due to lightheadedness; pt continued additional 25 feet with syncope episode near mat table; pt max A for stand>sit with therapist assisted decent and postural control. Pt. Required prolonged rest/recovery break with family education component with family awareness to symptoms of patients syncope episode. Pt. Ambulated with daughter 35 feet and experienced syncope max A for therapist and daughter to position pt. Seated in w/c for recovery.  Pt. Propelled back to room using BLE's with mod I use of w/c. Therapist consulted with SW in pt. Room post tx session and facilitated further discussion of patient's needs and presentations for SNF placement and continuum of care.  Pain: unrated; "it is the same" as stated by the patient. Denies medication or additional pain management.  Pt. Condition post therapy session: positioned in room recliner with feet elevated with all needs within reach. Daughter present and speaking to SW. Pt. Remained on 3L/min O2 for session and post session; SpO2 ranged 93-99%   Therapy Documentation Precautions:  Precautions Precautions: Fall Precaution Comments: 3 L/min O2 with activity Required Braces or Orthoses: Other Brace/Splint Other Brace/Splint: abdominal binder Restrictions Weight Bearing Restrictions: No Pain: Pain Assessment Pain Assessment: 0-10 Pain Score: 5  Faces Pain Scale: Hurts a little bit Pain Location: Back Pain Orientation: Lower Pain Descriptors / Indicators: Aching  Patients Stated Pain Goal: 2 Pain Intervention(s):  Repositioned Multiple Pain Sites: Yes 2nd Pain Site Pain Score: 6 Pain Type: Chronic pain Pain Location: Leg Pain Orientation: Right Pain Descriptors / Indicators: Aching Pain Frequency: Constant Pain Onset: Gradual Patient's Stated Pain Goal: 2 Pain Intervention(s): Repositioned Locomotion : Ambulation Ambulation/Gait Assistance: 4: Min guard;4: Min Financial controller Distance: 165   See FIM for current functional status  Therapy/Group: Individual Therapy  Juluis Mire 08/05/2014, 12:12 PM

## 2014-08-05 NOTE — Progress Notes (Signed)
I continue to follow and support Linda [name]. Today her daughter and son-in-law are at bedside from [place]. She is pleased that her daughter is here. I explained to daughter, Linda [name], who I am and that I have been talking with Ms. Nestler about her grief and depression. I encouraged [name] to help Ms. Deschene coordinate to get to grief counseling with hospice and possibly support groups to build a stronger support system. Will continue to follow.   Vinie Sill, NP Palliative Medicine Team Pager # [phone] (M-F 8a-5p) Team Phone # [phone] (Nights/Weekends)

## 2014-08-05 NOTE — Discharge Instructions (Signed)
Inpatient Rehab Discharge Instructions  Linda [name] Discharge date and time:    Activities/Precautions/ Functional Status: Activity: activity as tolerated with supervision Diet: regular diet Wound Care: none needed Functional status:  ___ No restrictions     ___ Walk up steps independently _X__ 24/7 supervision/assistance   ___ Walk up steps with assistance ___ Intermittent supervision/assistance  ___ Bathe/dress independently _X__ Walk with walker    ___ Bathe/dress with assistance ___ Walk Independently    ___ Shower independently _X__ Walk with supervision         ___ Shower with assistance ___ No alcohol     ___ Return to work/school ________  COMMUNITY REFERRALS UPON DISCHARGE:   Home Health:   PT     OT     RN     SNA     SW     Agency:  [facility] Phone:  [phone] Medical Equipment/Items Ordered:  Agency/Supplier:  GENERAL COMMUNITY RESOURCES FOR PATIENT/FAMILY: Support Groups:  Ask Hospice about grief support groups   Grief Counseling:  [facility]                               [address] [place], [place]  [address]                               [phone]                                August 18, 2014 @ 1:00 PM  Special Instructions:    My questions have been answered and I understand these instructions. I will adhere to these goals and the provided educational materials after my discharge from the hospital.  Patient/Caregiver Signature _______________________________ Date __________  Clinician Signature _______________________________________ Date __________  Please bring this form and your medication list with you to all your follow-up doctor's appointments.

## 2014-08-06 ENCOUNTER — Encounter (HOSPITAL_COMMUNITY): Payer: Self-pay | Admitting: *Deleted

## 2014-08-06 ENCOUNTER — Inpatient Hospital Stay (HOSPITAL_COMMUNITY): Payer: Medicare Other | Admitting: Occupational Therapy

## 2014-08-06 MED ORDER — ALPRAZOLAM 0.5 MG PO TABS
0.5000 mg | ORAL_TABLET | Freq: Three times a day (TID) | ORAL | Status: DC | PRN
  Administered 2014-08-06 – 2014-08-08 (×5): 1 mg via ORAL
  Filled 2014-08-06 (×5): qty 2

## 2014-08-06 NOTE — Progress Notes (Signed)
Social Work Patient ID: Linda [name], female   DOB: [birth year], 68 y.o.   MRN: [id number]  CSW met with pt, pt's dtr and son-in-law, and son extensively on 08-05-14 to discuss pt's d/c plan, overall health, and plan of care while still on CIR.  Pt's dtr was wondering about short-term SNF for pt.  CSW asked pt if she was receptive to this option.  Pt took some time to think about it and was agreeable, however she wanted to go home for a few hours first.  CSW explained that she would need to transfer straight to SNF.  She was disappointed, but understood.  Her dtr stated that they would facetime so that pt's dtr would pack the right clothes.  Pt has friends from church who toured [employer].  This is pt's first choice.  CSW was able to contact them and send FL2.  They will have a bed for pt on Monday.  CSW informed pt and dtr of this news and they were pleased.  CSW emphasized that pt may still need someone at home with her once SNF time is complete and that pt would probably not be at [facility] for very long.  They expressed understanding, but dtr remained hopeful that pt would not need 24/7 supervision.  CSW really tried to prepare them that it may still be necessary due to pt's syncopal episodes.  Pt and family had a very open conversation about their concerns of pt being on too many medications and potential abuse of pain medications.  Although this was difficult for pt to hear and talk about, she heard how much her children cared for her and how much they love her.  Pt wants to get better too and is agreeable to grief counseling, psychiatry f/u, and substance abuse counseling.  Pt's children were quite appreciative of CSW involvement.  CSW arranged grief counseling for pt.  CSW also informed pt and family that [facility] would be out of the hospital next week.  [facility]orre Nick and [place], [facility] team, will f/u with d/c plan for SNF tx on Monday.  DME and [facility] on hold due to pt's plan to go to SNF until she is stronger to go home.

## 2014-08-06 NOTE — Progress Notes (Signed)
Linda [name] is a 68 y.o. female 09-Jul-1946 [id number]  Subjective: C/o anxiety. "I need my Xanax back". Feeling OK otherwise.  Objective: Vital signs in last 24 hours: Temp:  [97.7 F (36.5 C)-97.9 F (36.6 C)] 97.7 F (36.5 C) (08/15 0549) Pulse Rate:  [76] 76 (08/14 2112) Resp:  [17] 17 (08/15 0549) BP: (160)/(80) 160/80 mmHg (08/14 2112) SpO2:  [97 %-98 %] 97 % (08/15 0549) Weight:  [167 lb 11.2 oz (76.068 kg)] 167 lb 11.2 oz (76.068 kg) (08/15 0549) Weight change: -4 lb 4.8 oz (-1.95 kg) Last BM Date: 08/05/14  Intake/Output from previous day: 08/14 0701 - 08/15 0700 In: 800 [P.O.:800] Out: -  Last cbgs: CBG (last 3)  No results found for this basename: GLUCAP,  in the last 72 hours   Physical Exam General: No apparent distress   HEENT: not dry Lungs: Normal effort. Lungs clear to auscultation, no crackles or wheezes. Cardiovascular: Regular rate and rhythm, no edema Abdomen: S/NT/ND; BS(+) Musculoskeletal:  unchanged Neurological: No new neurological deficits Wounds: N/A    Skin: clear  Aging changes Mental state: Alert, oriented, cooperative    Lab Results: BMET    Component Value Date/Time   NA 140 08/02/2014 1410   K 3.7 08/02/2014 1410   CL 99 08/02/2014 1410   CO2 26 08/02/2014 1410   GLUCOSE 121* 08/02/2014 1410   BUN 14 08/02/2014 1410   CREATININE 0.68 08/02/2014 1410   CREATININE 0.65 05/27/2013 1706   CALCIUM 9.0 08/02/2014 1410   GFRNONAA 88* 08/02/2014 1410   GFRAA >90 08/02/2014 1410   CBC    Component Value Date/Time   WBC 6.7 08/02/2014 1410   WBC 6.1 07/08/2014 1221   RBC 3.66* 08/02/2014 1410   RBC 3.56* 07/25/2014 0505   RBC 4.13 07/08/2014 1221   HGB 10.9* 08/02/2014 1410   HGB 12.8 07/08/2014 1221   HCT 34.7* 08/02/2014 1410   HCT 38.6 07/08/2014 1221   PLT 164 08/02/2014 1410   PLT 226 07/08/2014 1221   MCV 94.8 08/02/2014 1410   MCV 94 07/08/2014 1221   MCH 29.8 08/02/2014 1410   MCH 31.0 07/08/2014 1221   MCHC 31.4 08/02/2014 1410   MCHC 33.2 07/08/2014 1221   RDW 14.7 08/02/2014 1410   RDW 12.3 07/08/2014 1221   LYMPHSABS 1.3 07/27/2014 0540   LYMPHSABS 1.5 07/08/2014 1221   MONOABS 0.6 07/27/2014 0540   EOSABS 0.2 07/27/2014 0540   EOSABS 0.1 07/08/2014 1221   BASOSABS 0.1 07/27/2014 0540   BASOSABS 0.0 07/08/2014 1221    Studies/Results: No results found.  Medications: I have reviewed the patient's current medications.  Assessment/Plan:  1. Functional deficits secondary to Gait disorder due to multiple medical and psychosocial issues---most notably her GI blood loss and loss of husband.  2. DVT Prophylaxis/Anticoagulation: SCD's, ambulation  3. Back pain/fibromyalgia/R>L Knee OA Pain Management: Will change percocet To home dose Continue lidocaine patch, Lyrica , reduce elavil. Off savella.  4. Depression with anxiety/Grief reaction/Mood: D/C paxil since she is on antidepressant doses of trazodone and elavil with xanax prn. Team to provide ego support. Will have LCSW follow for support and evaluation. Neuropsych to follow up for support  5. Neuropsych: This patient is capable of making decisions on her own behalf.  6. Skin/Wound Care: Routine pressure relief measures. Added nutritional supplement.  7. Gastritis: Continue PPI. Serial check of H/H. Off ASA. GI to determine restart date  8. Acute on chronic anemia: Iron supplemented and to follow up  with hem/onc past discharge. Continue folic acid.  9. Dizziness: Check orthostatic blood pressures. off elavil, BPs now up H/o BPPV in the past but current sx not suggestive .  10 Right petrous bone mass: will need follow up MRI for work up.  11. Chronic insomnia: CPAP not functional---will need to follow up with pulmonary to reschedule study. Continue trazodone at bedtime. -- 100 qhs per home routine  12. Non-obst CAD with angina: continue ASA with bystolic daily. Off imdur d/c due to orthostatic changes, will resume now that BP is up. Monitor for symptoms with increase in activity.   73. [name] with asthma: Oxygen dependent. Albuterol prn.  14. Increased response latency- mood disorder per Neuropsych and Psychiatry   Continue with current  therapy as reflected on the Med list.  Length of stay, days: [place] , MD 08/06/2014, 1:23 PM

## 2014-08-06 NOTE — Progress Notes (Signed)
Occupational Therapy Session Note  Patient Details  Name: Linda [name] MRN: [id number] Date of Birth: [birth year]  Today's Date: 08/06/2014 Time: 0800-0900 Time Calculation (min): 60 min  Short Term Goals: Week 1:  OT Short Term Goal 1 (Week 1): Short Term Goals = Long Term Goal secondary to ELOS OT Short Term Goal 1 - Progress (Week 1): Met Week 2:  OT Short Term Goal 1 (Week 2): Pt will demo improved awareness of compensatory strategies to reduce episodes of orthostatic hypotension during ADL OT Short Term Goal 2 (Week 2): Pt will complete lower body dressing using AE, prn, with setup assist OT Short Term Goal 3 (Week 2): Pt will don/doff abdominal binder with setup assist  Skilled Therapeutic Interventions/Progress Updates:    Pt seen for BADL retraining of B/D with a focus on energy conservation, pacing her activities, and functional mobility with RW. Pt was in bathroom with nurse tech at start of session. This clinician took over as pt transferred to toilet and then to shower to complete self care.  Pt was able to complete her self care with close S, but needed frequent rest breaks especially when she felt that she was about to black out.  Pt demonstrated improved activity tolerance during standing in shower for brief periods of time.  Pt anxious about her discharge plans, discussed positive aspects of placement with continued therapy and 24 hr supervision.  At end of session, pt in w/c completing her grooming tasks at the sink. Pt stated she would call for A to transfer to the recliner.  Therapy Documentation Precautions:  Precautions Precautions: Fall Precaution Comments: 3 L/min O2 with activity Required Braces or Orthoses: Other Brace/Splint Other Brace/Splint: abdominal binder Restrictions Weight Bearing Restrictions: No   Pain: 5/10 - chronic back pain. Pt rested as needed.   ADL: See FIM for current functional status  Therapy/Group: Individual  Therapy  [place] 08/06/2014, 12:16 PM

## 2014-08-07 ENCOUNTER — Inpatient Hospital Stay (HOSPITAL_COMMUNITY): Payer: Medicare Other | Admitting: *Deleted

## 2014-08-07 NOTE — Progress Notes (Signed)
Linda [name] is a 68 y.o. female 12-20-1946 [id number]  Subjective: C/o anxiety. "I need my Xanax back". Feeling OK otherwise.  Objective: Vital signs in last 24 hours: Temp:  [97.7 F (36.5 C)-98.1 F (36.7 C)] 98 F (36.7 C) (08/16 0348) Pulse Rate:  [77-82] 80 (08/16 0348) Resp:  [17-18] 18 (08/16 0348) BP: (131-133)/(72-75) 133/72 mmHg (08/15 2150) SpO2:  [98 %-100 %] 98 % (08/16 0348) Weight:  [165 lb 9.6 oz (75.116 kg)] 165 lb 9.6 oz (75.116 kg) (08/16 0348) Weight change: -2 lb 1.6 oz (-0.953 kg) Last BM Date: 08/06/14  Intake/Output from previous day: 08/15 0701 - 08/16 0700 In: 840 [P.O.:840] Out: -  Last cbgs: CBG (last 3)  No results found for this basename: GLUCAP,  in the last 72 hours   Physical Exam General: No apparent distress   HEENT: not dry Lungs: Normal effort. Lungs clear to auscultation, no crackles or wheezes. Cardiovascular: Regular rate and rhythm, no edema Abdomen: S/NT/ND; BS(+) Musculoskeletal:  unchanged Neurological: No new neurological deficits Wounds: N/A    Skin: clear  Aging changes Mental state: Alert, oriented, cooperative    Lab Results: BMET    Component Value Date/Time   NA 140 08/02/2014 1410   K 3.7 08/02/2014 1410   CL 99 08/02/2014 1410   CO2 26 08/02/2014 1410   GLUCOSE 121* 08/02/2014 1410   BUN 14 08/02/2014 1410   CREATININE 0.68 08/02/2014 1410   CREATININE 0.65 05/27/2013 1706   CALCIUM 9.0 08/02/2014 1410   GFRNONAA 88* 08/02/2014 1410   GFRAA >90 08/02/2014 1410   CBC    Component Value Date/Time   WBC 6.7 08/02/2014 1410   WBC 6.1 07/08/2014 1221   RBC 3.66* 08/02/2014 1410   RBC 3.56* 07/25/2014 0505   RBC 4.13 07/08/2014 1221   HGB 10.9* 08/02/2014 1410   HGB 12.8 07/08/2014 1221   HCT 34.7* 08/02/2014 1410   HCT 38.6 07/08/2014 1221   PLT 164 08/02/2014 1410   PLT 226 07/08/2014 1221   MCV 94.8 08/02/2014 1410   MCV 94 07/08/2014 1221   MCH 29.8 08/02/2014 1410   MCH 31.0 07/08/2014 1221   MCHC 31.4  08/02/2014 1410   MCHC 33.2 07/08/2014 1221   RDW 14.7 08/02/2014 1410   RDW 12.3 07/08/2014 1221   LYMPHSABS 1.3 07/27/2014 0540   LYMPHSABS 1.5 07/08/2014 1221   MONOABS 0.6 07/27/2014 0540   EOSABS 0.2 07/27/2014 0540   EOSABS 0.1 07/08/2014 1221   BASOSABS 0.1 07/27/2014 0540   BASOSABS 0.0 07/08/2014 1221    Studies/Results: No results found.  Medications: I have reviewed the patient's current medications.  Assessment/Plan:  1. Functional deficits secondary to Gait disorder due to multiple medical and psychosocial issues---most notably her GI blood loss and loss of husband.  2. DVT Prophylaxis/Anticoagulation: SCD's, ambulation  3. Back pain/fibromyalgia/R>L Knee OA Pain Management: Will change percocet To home dose Continue lidocaine patch, Lyrica , reduce elavil. Off savella.  4. Depression with anxiety/Grief reaction/Mood: D/C paxil since she is on antidepressant doses of trazodone and elavil with xanax prn. Team to provide ego support. Will have LCSW follow for support and evaluation. Neuropsych to follow up for support  5. Neuropsych: This patient is capable of making decisions on her own behalf.  6. Skin/Wound Care: Routine pressure relief measures. Added nutritional supplement.  7. Gastritis: Continue PPI. Serial check of H/H. Off ASA. GI to determine restart date  8. Acute on chronic anemia: Iron supplemented and to follow  up with hem/onc past discharge. Continue folic acid.  9. Dizziness: Check orthostatic blood pressures. off elavil, BPs now up H/o BPPV in the past but current sx not suggestive .  10 Right petrous bone mass: will need follow up MRI for work up.  11. Chronic insomnia: CPAP not functional---will need to follow up with pulmonary to reschedule study. Continue trazodone at bedtime. -- 100 qhs per home routine  12. Non-obst CAD with angina: continue ASA with bystolic daily. Off imdur d/c due to orthostatic changes, will resume now that BP is up. Monitor for symptoms with  increase in activity.  41. [name] with asthma: Oxygen dependent. Albuterol prn.  14. Increased response latency- mood disorder per Neuropsych and Psychiatry   Continue with current  Therapy.  Length of stay, days: 12  Walker Kehr , MD 08/07/2014, 11:51 AM

## 2014-08-07 NOTE — Progress Notes (Signed)
Occupational Therapy Session Note  Patient Details  Name: Linda [name] MRN: [id number] Date of Birth: 08/01/1946  Today's Date: 08/07/2014 Time:  -   1010--1110  (60 min)    Short Term Goals: Week 1:  OT Short Term Goal 1 (Week 1): Short Term Goals = Long Term Goal secondary to ELOS OT Short Term Goal 1 - Progress (Week 1): Met Week 2:  OT Short Term Goal 1 (Week 2): Pt will demo improved awareness of compensatory strategies to reduce episodes of orthostatic hypotension during ADL OT Short Term Goal 2 (Week 2): Pt will complete lower body dressing using AE, prn, with setup assist OT Short Term Goal 3 (Week 2): Pt will don/doff abdominal binder with setup assist Week 3:     Skilled Therapeutic Interventions/Progress Updates:    pt. Sitting in wc leaning over to sort clothes on the floor.  Remined pt to [employer] when leaning over.  Pt engaged in BADL retraining with primary focus on activity tolerance, functional amb with RW, transfers, sit<>stand, dynamic standing balance, and safety awareness.  Pt. Utilized wc to retrieve clothes and set up for bathing and dressing with assistance with set up and sit to stand for balance.  See FIM for levels.    At end of session:  BP= 112/69, HR= 81, O2= 95%.  Pt. Following through with wc safety with distant supervision.       Therapy Documentation Precautions:  Precautions Precautions: Fall Precaution Comments: 3 L/min O2 with activity Required Braces or Orthoses: Other Brace/Splint Other Brace/Splint: abdominal binder Restrictions Weight Bearing Restrictions: No      Pain: Pain Assessment Pain Score: 7 Pain Type: Chronic pain Pain Location: Back Pain Orientation: Lower Pain Intervention(s): Medication (See eMAR)  See FIM for current functional status  Therapy/Group: Individual Therapy  [place], Cedar 08/07/2014, 10:24 AM

## 2014-08-08 ENCOUNTER — Inpatient Hospital Stay (HOSPITAL_COMMUNITY): Payer: Medicare Other

## 2014-08-08 ENCOUNTER — Encounter (HOSPITAL_COMMUNITY): Payer: Medicare Other

## 2014-08-08 ENCOUNTER — Inpatient Hospital Stay (HOSPITAL_COMMUNITY): Payer: Medicare Other | Admitting: Physical Therapy

## 2014-08-08 DIAGNOSIS — IMO0002 Reserved for concepts with insufficient information to code with codable children: Secondary | ICD-10-CM | POA: Diagnosis not present

## 2014-08-08 DIAGNOSIS — R269 Unspecified abnormalities of gait and mobility: Secondary | ICD-10-CM | POA: Diagnosis not present

## 2014-08-08 DIAGNOSIS — N390 Urinary tract infection, site not specified: Secondary | ICD-10-CM | POA: Diagnosis not present

## 2014-08-08 DIAGNOSIS — M545 Low back pain, unspecified: Secondary | ICD-10-CM | POA: Diagnosis not present

## 2014-08-08 DIAGNOSIS — M2550 Pain in unspecified joint: Secondary | ICD-10-CM | POA: Diagnosis not present

## 2014-08-08 DIAGNOSIS — G4733 Obstructive sleep apnea (adult) (pediatric): Secondary | ICD-10-CM | POA: Diagnosis not present

## 2014-08-08 DIAGNOSIS — K922 Gastrointestinal hemorrhage, unspecified: Secondary | ICD-10-CM | POA: Diagnosis not present

## 2014-08-08 DIAGNOSIS — I509 Heart failure, unspecified: Secondary | ICD-10-CM | POA: Diagnosis not present

## 2014-08-08 DIAGNOSIS — G8929 Other chronic pain: Secondary | ICD-10-CM | POA: Diagnosis not present

## 2014-08-08 DIAGNOSIS — E785 Hyperlipidemia, unspecified: Secondary | ICD-10-CM | POA: Diagnosis not present

## 2014-08-08 DIAGNOSIS — J4489 Other specified chronic obstructive pulmonary disease: Secondary | ICD-10-CM | POA: Diagnosis not present

## 2014-08-08 DIAGNOSIS — R079 Chest pain, unspecified: Secondary | ICD-10-CM | POA: Diagnosis not present

## 2014-08-08 DIAGNOSIS — IMO0001 Reserved for inherently not codable concepts without codable children: Secondary | ICD-10-CM

## 2014-08-08 DIAGNOSIS — R55 Syncope and collapse: Secondary | ICD-10-CM | POA: Diagnosis present

## 2014-08-08 DIAGNOSIS — G4700 Insomnia, unspecified: Secondary | ICD-10-CM | POA: Diagnosis not present

## 2014-08-08 DIAGNOSIS — R350 Frequency of micturition: Secondary | ICD-10-CM | POA: Diagnosis not present

## 2014-08-08 DIAGNOSIS — M5137 Other intervertebral disc degeneration, lumbosacral region: Secondary | ICD-10-CM | POA: Diagnosis not present

## 2014-08-08 DIAGNOSIS — D510 Vitamin B12 deficiency anemia due to intrinsic factor deficiency: Secondary | ICD-10-CM | POA: Diagnosis not present

## 2014-08-08 DIAGNOSIS — I10 Essential (primary) hypertension: Secondary | ICD-10-CM | POA: Diagnosis not present

## 2014-08-08 DIAGNOSIS — R262 Difficulty in walking, not elsewhere classified: Secondary | ICD-10-CM | POA: Diagnosis not present

## 2014-08-08 DIAGNOSIS — M1990 Unspecified osteoarthritis, unspecified site: Secondary | ICD-10-CM | POA: Diagnosis not present

## 2014-08-08 DIAGNOSIS — F411 Generalized anxiety disorder: Secondary | ICD-10-CM | POA: Diagnosis not present

## 2014-08-08 DIAGNOSIS — Z86718 Personal history of other venous thrombosis and embolism: Secondary | ICD-10-CM | POA: Diagnosis not present

## 2014-08-08 DIAGNOSIS — R5381 Other malaise: Secondary | ICD-10-CM

## 2014-08-08 DIAGNOSIS — D649 Anemia, unspecified: Secondary | ICD-10-CM | POA: Diagnosis not present

## 2014-08-08 DIAGNOSIS — K5900 Constipation, unspecified: Secondary | ICD-10-CM | POA: Diagnosis not present

## 2014-08-08 DIAGNOSIS — M25559 Pain in unspecified hip: Secondary | ICD-10-CM | POA: Diagnosis not present

## 2014-08-08 DIAGNOSIS — R930 Abnormal findings on diagnostic imaging of skull and head, not elsewhere classified: Secondary | ICD-10-CM | POA: Diagnosis not present

## 2014-08-08 DIAGNOSIS — M6281 Muscle weakness (generalized): Secondary | ICD-10-CM | POA: Diagnosis not present

## 2014-08-08 DIAGNOSIS — F341 Dysthymic disorder: Secondary | ICD-10-CM

## 2014-08-08 DIAGNOSIS — R488 Other symbolic dysfunctions: Secondary | ICD-10-CM | POA: Diagnosis not present

## 2014-08-08 DIAGNOSIS — F332 Major depressive disorder, recurrent severe without psychotic features: Secondary | ICD-10-CM | POA: Diagnosis not present

## 2014-08-08 DIAGNOSIS — K219 Gastro-esophageal reflux disease without esophagitis: Secondary | ICD-10-CM | POA: Diagnosis not present

## 2014-08-08 DIAGNOSIS — J9610 Chronic respiratory failure, unspecified whether with hypoxia or hypercapnia: Secondary | ICD-10-CM | POA: Diagnosis not present

## 2014-08-08 DIAGNOSIS — Z5189 Encounter for other specified aftercare: Secondary | ICD-10-CM

## 2014-08-08 DIAGNOSIS — D509 Iron deficiency anemia, unspecified: Secondary | ICD-10-CM | POA: Diagnosis not present

## 2014-08-08 DIAGNOSIS — J449 Chronic obstructive pulmonary disease, unspecified: Secondary | ICD-10-CM | POA: Diagnosis not present

## 2014-08-08 DIAGNOSIS — I119 Hypertensive heart disease without heart failure: Secondary | ICD-10-CM | POA: Diagnosis not present

## 2014-08-08 DIAGNOSIS — I5032 Chronic diastolic (congestive) heart failure: Secondary | ICD-10-CM | POA: Diagnosis not present

## 2014-08-08 DIAGNOSIS — I2782 Chronic pulmonary embolism: Secondary | ICD-10-CM | POA: Diagnosis not present

## 2014-08-08 DIAGNOSIS — F4321 Adjustment disorder with depressed mood: Secondary | ICD-10-CM | POA: Diagnosis not present

## 2014-08-08 DIAGNOSIS — D62 Acute posthemorrhagic anemia: Secondary | ICD-10-CM | POA: Diagnosis not present

## 2014-08-08 MED ORDER — SENNOSIDES-DOCUSATE SODIUM 8.6-50 MG PO TABS: 2.0000 | ORAL_TABLET | Freq: Every day | ORAL | Status: DC

## 2014-08-08 MED ORDER — ISOSORBIDE MONONITRATE 15 MG HALF TABLET: 15.0000 mg | ORAL_TABLET | Freq: Every day | ORAL | Status: DC

## 2014-08-08 MED ORDER — ALPRAZOLAM 0.5 MG PO TABS
1.0000 mg | ORAL_TABLET | Freq: Three times a day (TID) | ORAL | Status: DC
  Administered 2014-08-08: 1 mg via ORAL
  Filled 2014-08-08: qty 2

## 2014-08-08 MED ORDER — OXYCODONE HCL 5 MG PO TABS: 5.0000 mg | ORAL_TABLET | Freq: Once | ORAL | Status: DC

## 2014-08-08 MED ORDER — FOLIC ACID 1 MG PO TABS: 2.0000 mg | ORAL_TABLET | Freq: Every day | ORAL | Status: DC

## 2014-08-08 MED ORDER — ALPRAZOLAM 1 MG PO TABS: 1.0000 mg | ORAL_TABLET | Freq: Three times a day (TID) | ORAL | Status: DC

## 2014-08-08 MED ORDER — TRAZODONE HCL 50 MG PO TABS: 150.0000 mg | ORAL_TABLET | Freq: Every day | ORAL | Status: DC

## 2014-08-08 MED ORDER — OXYCODONE HCL 5 MG PO TABS: 5.0000 mg | ORAL_TABLET | Freq: Three times a day (TID) | ORAL | Status: DC | PRN

## 2014-08-08 MED ORDER — OXYCODONE HCL 10 MG PO TABS: 10.0000 mg | ORAL_TABLET | Freq: Three times a day (TID) | ORAL | Status: DC | PRN

## 2014-08-08 MED ORDER — OXYCODONE HCL 5 MG PO TABS: 10.0000 mg | ORAL_TABLET | Freq: Three times a day (TID) | ORAL | Status: DC | PRN

## 2014-08-08 MED ORDER — TRAMADOL HCL 50 MG PO TABS: 50.0000 mg | ORAL_TABLET | Freq: Four times a day (QID) | ORAL | Status: DC | PRN

## 2014-08-08 MED ORDER — OXYCODONE HCL 5 MG PO TABS
10.0000 mg | ORAL_TABLET | Freq: Once | ORAL | Status: AC
  Administered 2014-08-08: 10 mg via ORAL
  Filled 2014-08-08: qty 2

## 2014-08-08 MED ORDER — DULOXETINE HCL 40 MG PO CPEP: 40.0000 mg | ORAL_CAPSULE | Freq: Every day | ORAL | Status: DC

## 2014-08-08 MED ORDER — PREGABALIN 200 MG PO CAPS: ORAL_CAPSULE | ORAL | Status: DC

## 2014-08-08 MED ORDER — HYDROCERIN EX CREA: 1.0000 "application " | TOPICAL_CREAM | Freq: Two times a day (BID) | CUTANEOUS | Status: DC

## 2014-08-08 MED ORDER — PANTOPRAZOLE SODIUM 40 MG PO TBEC: 40.0000 mg | DELAYED_RELEASE_TABLET | Freq: Every day | ORAL | Status: DC

## 2014-08-08 MED ORDER — TRIAMCINOLONE ACETONIDE 0.1 % EX CREA: TOPICAL_CREAM | Freq: Every day | CUTANEOUS | Status: DC

## 2014-08-08 NOTE — Progress Notes (Signed)
Physical Therapy Note  Patient Details  Name: Linda [name] MRN: [id number] Date of Birth: [birth year] Today's Date: 08/08/2014    Pt missed 50min of scheduled skilled physical therapy as she was discharged to SNF.   Aliene Altes, PT, DPT  08/08/2014, 2:57 PM

## 2014-08-08 NOTE — Progress Notes (Signed)
Physical Therapy Session Note  Patient Details  Name: Linda [name] MRN: [id number] Date of Birth: 1946/03/06  Today's Date: 08/08/2014 PT Individual Time: 1130-1200 PT Individual Time Calculation (min): 30 min   Short Term Goals: Week 1:  PT Short Term Goal 1 (Week 1): STG=LTGs  Skilled Therapeutic Interventions/Progress Updates:  Treatment Session 1:  1:1. Pt received sitting in recliner, ready for therapy. Pt reporting 5/10 pain, overall improved from when RN provided pain medicine prior to start of session. Focus this session on activity tolerance, functional mobility and functional transfers. Pt propelled w/c 225' w/ B UE/LE in controlled environment and  74' w/ B UE/LE in home environment, overall mod (I) level. Pt req min guard for ambulation in home environment up to 22' w/ RW then experienced syncopal episode req total A to sit safely on couch, req approx 58min of seated rest for recovery. Unable to obtain BP as machine was not in immediate environment. Pt req supervision for all SPT w/c<>car and couch<>w/c>recliner w/ RW. Pt left sitting in recliner at end of session w/ all needs in reach. Pt able to maintain SaO2 >90% on 3L throughout session.   Therapy Documentation Precautions:  Precautions Precautions: Fall Precaution Comments: 3 L/min O2 with activity Required Braces or Orthoses: Other Brace/Splint Other Brace/Splint: abdominal binder Restrictions Weight Bearing Restrictions: No Vital Signs: Oxygen Therapy SpO2: 99 % O2 Device: Nasal cannula O2 Flow Rate (L/min): 3 L/min Pain: Pain Assessment Pain Score: 5  Pain Type: Chronic pain Pain Location: Back Pain Descriptors / Indicators: Aching Pain Intervention(s): Medication (See eMAR)  See FIM for current functional status  Therapy/Group: Individual Therapy  Gilmore Laroche 08/08/2014, 12:11 PM

## 2014-08-08 NOTE — Plan of Care (Signed)
Problem: RH PAIN MANAGEMENT Goal: RH STG PAIN MANAGED AT OR BELOW PT'S PAIN GOAL Pain level 5 or less on a scale of 0-10.  Outcome: Not Met (add Reason) Pt has chronic pain to back- pt complains of high pain levels even with medication on board.

## 2014-08-08 NOTE — Plan of Care (Signed)
Problem: RH Ambulation Goal: LTG Patient will ambulate in controlled environment (PT) LTG: Patient will ambulate in a controlled environment, # of feet with assistance (PT).  Outcome: Not Met (add Reason) Goal not met; continues to require min A secondary to high falls risk; syncope episodes

## 2014-08-08 NOTE — Progress Notes (Addendum)
[facility] PHYSICAL MEDICINE & REHABILITATION     PROGRESS NOTE    Subjective/Complaints: Still having difficulty with anxiety. Getting stronger but slow process   Review of Systems - Negative except dizziness Objective: Vital Signs: Blood pressure 131/77, pulse 73, temperature 98.1 F (36.7 C), temperature source Oral, resp. rate 18, weight 75 kg (165 lb 5.5 oz), SpO2 93.00%. No results found. No results found for this basename: WBC, HGB, HCT, PLT,  in the last 72 hours No results found for this basename: NA, K, CL, CO, GLUCOSE, BUN, CREATININE, CALCIUM,  in the last 72 hours CBG (last 3)  No results found for this basename: GLUCAP,  in the last 72 hours  Wt Readings from Last 3 Encounters:  08/08/14 75 kg (165 lb 5.5 oz)  07/26/14 62.1 kg (136 lb 14.5 oz)  07/26/14 62.1 kg (136 lb 14.5 oz)    Physical Exam:  Constitutional: She is oriented to person, place, and time. She appears well-developed and well-nourished.  No distress HENT: oral mucosa pink, dentition good  Head: Normocephalic and atraumatic.  Eyes: Conjunctivae are normal. Pupils are equal, round, and reactive to light.  Neck: Normal range of motion. Neck supple.  Cardiovascular: Normal rate and regular rhythm. No murmurs  Respiratory: Effort normal and breath sounds normal. No respiratory distress. She has no wheezes.  GI: Soft. Bowel sounds are normal. She exhibits no distension. There is no tenderness.  Neurological: She is alert and oriented to person, place, and time. Mild sensory loss over right foot and ulnar hand is persistent---otherwise normal sensory exam. Strength grossly 4+/5 in bilateral UE's. LE's 4/5 HF and Knees, 4/5 ankles.. Normal rom and fmc. Cognitively displays good insight and awareness.  Skin: Skin is warm and dry.eczematous patches  Psychiatric: Her speech is normal and behavior is normal. Judgment normal. Her mood is slightly anxious. Slow to respond to questions , flat affect   .    Assessment/Plan: 1. Functional deficits secondary to gait disorder/deconditioning which require 3+ hours per day of interdisciplinary therapy in a comprehensive inpatient rehab setting. Physiatrist is providing close team supervision and 24 hour management of active medical problems listed below. Physiatrist and rehab team continue to assess barriers to discharge/monitor patient progress toward functional and medical goals.  To SNF today  FIM: FIM - Bathing Bathing Steps Patient Completed: Chest;Right Arm;Left Arm;Abdomen;Buttocks;Front perineal area;Right upper leg;Left upper leg;Right lower leg (including foot);Left lower leg (including foot) Bathing: 5: Supervision: Safety issues/verbal cues  FIM - Upper Body Dressing/Undressing Upper body dressing/undressing steps patient completed: Thread/unthread right bra strap;Thread/unthread left bra strap;Hook/unhook bra;Thread/unthread right sleeve of pullover shirt/dresss;Thread/unthread left sleeve of pullover shirt/dress;Put head through opening of pull over shirt/dress;Pull shirt over trunk Upper body dressing/undressing: 5: Set-up assist to: Obtain clothing/put away FIM - Lower Body Dressing/Undressing Lower body dressing/undressing steps patient completed: Thread/unthread right underwear leg;Thread/unthread left underwear leg;Pull underwear up/down;Thread/unthread right pants leg;Thread/unthread left pants leg;Pull pants up/down;Fasten/unfasten pants;Don/Doff right sock;Don/Doff left sock Lower body dressing/undressing: 5: Supervision: Safety issues/verbal cues  FIM - Toileting Toileting steps completed by patient: Adjust clothing prior to toileting;Adjust clothing after toileting;Performs perineal hygiene Toileting: 5: Supervision: Safety issues/verbal cues  FIM - Radio producer Devices: Grab bars;Walker Toilet Transfers: 5-To toilet/BSC: Supervision (verbal cues/safety issues);5-From toilet/BSC:  Supervision (verbal cues/safety issues)  FIM - Control and instrumentation engineer Devices: Bed rails Bed/Chair Transfer: 0: Activity did not occur  FIM - Locomotion: Wheelchair Distance: 155 Locomotion: Wheelchair: 5: Travels 150 ft or more: maneuvers on  rugs and over door sills with supervision, cueing or coaxing FIM - Locomotion: Ambulation Locomotion: Ambulation Assistive Devices: Walker - Rolling Ambulation/Gait Assistance: 4: Min guard;4: Min assist Locomotion: Ambulation: 2: Travels 50 - 149 ft with minimal assistance (Pt.>75%)  Comprehension Comprehension Mode: Auditory Comprehension: 6-Follows complex conversation/direction: With extra time/assistive device  Expression Expression Mode: Verbal Expression: 6-Expresses complex ideas: With extra time/assistive device  Social Interaction Social Interaction: 6-Interacts appropriately with others with medication or extra time (anti-anxiety, antidepressant).  Problem Solving Problem Solving: 6-Solves complex problems: With extra time  Memory Memory: 5-Recognizes or recalls 90% of the time/requires cueing < 10% of the time  Medical Problem List and Plan:  1. Functional deficits secondary to Gait disorder due to multiple medical and psychosocial issues---most notably her GI blood loss and loss of husband.  2. DVT Prophylaxis/Anticoagulation: SCD's, ambulation 3. Back pain/fibromyalgia/R>L Knee OA  Pain Management: Will change   percocet  To home dose Continue lidocaine patch, Lyrica , reduce elavil.   Off savella.  4. Depression with anxiety/Grief reaction/Mood:  Trazodone/cymbalta  .   -pt was on TID xanax at home for anxiety---can resume (klonopin not effective)  -still having a lot of difficulties with grieving reaction over husband's death 5. Neuropsych: This patient is capable of making decisions on her own behalf.  6. Skin/Wound Care: Routine pressure relief measures. Added nutritional supplement.  7.  Gastritis: Continue PPI. Serial check of H/H. Off  ASA. GI to determine restart date 8. Acute on chronic anemia: Iron supplemented and to follow up with hem/onc past discharge. Continue folic acid.  9. Dizziness: Check orthostatic blood pressures.  off elavil, BPs now up H/o BPPV in the past but current sx not suggestive .  10 Right petrous bone mass: will need follow up MRI for work up.  11. Chronic insomnia: CPAP not functional---will need to follow up with pulmonary to reschedule study. Continue trazodone at bedtime. -- 150mg  qhs   12. Non-obst CAD with angina: continue ASA with bystolic daily. Off imdur d/c due to orthostatic changes, will resume now that BP is up. Monitor for symptoms with increase in activity.  32. [name] with asthma: Oxygen dependent. Albuterol  prn.  14.  Increased response latency- mood disorder per Neuropsych and Psychiatry       LOS (Days) 13 A FACE TO FACE EVALUATION WAS PERFORMED```  Linda [name] T 08/08/2014 9:06 AM

## 2014-08-08 NOTE — Progress Notes (Signed)
Pt discharged to [facility] via ambulance at 1415. Belongings with pt and family came to retrieve the remaining belongings.

## 2014-08-08 NOTE — Progress Notes (Signed)
Occupational Therapy Discharge Summary  Patient Details  Name: Linda [name] MRN: [id number] Date of Birth: 03-03-1946  Today's Date: 08/08/2014  Patient has met 12 of 12 long term goals due to improved activity tolerance, improved balance and ability to compensate for deficits. Pt made steady gains with BADLs during this admission and is supervision for all BADLs.  Pt continues to have syncope episodes and will require 24 hour supervision at discharge.  Patient to discharge at overall Supervision level.  Patient's care partner unavailable to provide the necessary physical assistance at discharge.    Reasons goals not met: n/a  Recommendation:  Patient will benefit from ongoing skilled OT services in skilled nursing facility setting to continue to advance functional skills in the area of BADL and iADL.  Equipment: No equipment provided Discharge to SNF  Reasons for discharge: treatment goals met  Patient/family agrees with progress made and goals achieved: Yes  OT Discharge  ADL  See FIM  Vision/Perception  Vision- History Baseline Vision/History: Wears glasses Wears Glasses: At all times Patient Visual Report: No change from baseline Vision- Assessment Vision Assessment?: No apparent visual deficits  Cognition Overall Cognitive Status: Within Functional Limits for tasks assessed Arousal/Alertness: Awake/alert Orientation Level: Oriented X4 Attention: Sustained Focused Attention: Appears intact Sustained Attention: Appears intact Memory: Appears intact Awareness: Appears intact Problem Solving: Appears intact Safety/Judgment: Appears intact Sensation Sensation Light Touch: Appears Intact Hot/Cold: Appears Intact Proprioception: Appears Intact Coordination Gross Motor Movements are Fluid and Coordinated: Yes Fine Motor Movements are Fluid and Coordinated: Yes Motor  Motor Motor: Within Functional Limits Trunk/Postural Assessment  Cervical  Assessment Cervical Assessment: Within Functional Limits Thoracic Assessment Thoracic Assessment: Within Functional Limits Lumbar Assessment Lumbar Assessment: Within Functional Limits Postural Control Postural Control: Within Functional Limits  Balance Static Sitting Balance Static Sitting - Level of Assistance: 7: Independent Dynamic Sitting Balance Dynamic Sitting - Level of Assistance: 5: Stand by assistance Extremity/Trunk Assessment RUE Assessment RUE Assessment: Within Functional Limits LUE Assessment LUE Assessment: Within Functional Limits  See FIM for current functional status  Leroy Libman 08/08/2014, 9:25 AM

## 2014-08-08 NOTE — Progress Notes (Signed)
Occupational Therapy Session Note  Patient Details  Name: Linda [name] MRN: [id number] Date of Birth: [birth year]  Today's Date: 08/08/2014 OT Individual Time:0900-1000 Time Calculation: 60 min   Short Term Goals: Week 2:  OT Short Term Goal 1 (Week 2): Pt will demo improved awareness of compensatory strategies to reduce episodes of orthostatic hypotension during ADL OT Short Term Goal 2 (Week 2): Pt will complete lower body dressing using AE, prn, with setup assist OT Short Term Goal 3 (Week 2): Pt will don/doff abdominal binder with setup assist  Skilled Therapeutic Interventions/Progress Updates:   Pt engaged in BADL retraining including bathing and dressing, toilet transfers, and functional amb with RW in room.  Pt requested to use toilet and initially amb with RW to bathroom.  Pt had a syncope episode and required tot A to redirect to w/c.  Unable to obtain vitals at that time. No further episodes during session. Pt performed stand-pivot transfer w/c<>toilet with supervision. Pt completed all bathing and dressing tasks at supervision level requiring assistance only with donning TED hose and abdominal binder.  Focus on activity tolerance, standing balance, sit<>stand, transfers, and safety awareness.  Therapy Documentation Precautions:  Precautions Precautions: Fall Precaution Comments: 3 L/min O2 with activity Required Braces or Orthoses: Other Brace/Splint Other Brace/Splint: abdominal binder Restrictions Weight Bearing Restrictions: No   Pain: Pain Assessment Pain Assessment: 0-10 Pain Score: 9  Pain Type: Chronic pain Pain Location: Back Pain Orientation: Lower Pain Descriptors / Indicators: Aching Pain Onset: On-going Patients Stated Pain Goal: 2 Pain Intervention(s): RN aware  See FIM for current functional status  Therapy/Group: Individual Therapy  Leroy Libman 08/08/2014, 10:03 AM

## 2014-08-08 NOTE — Progress Notes (Signed)
Physical Therapy Discharge Summary  Patient Details  Name: Linda [name] MRN: [id number] Date of Birth: [birth year]  Today's Date: 08/08/2014 PT Individual Time: 1015-1103 PT Individual Time Calculation (min): 48 min    Patient has met 6 of 7 long term goals due to improved activity tolerance, improved balance and improved awareness.  Patient to discharge at supervision level for basic transfers and w/c mobility and an ambulatory level Min Assist for very short distances secondary to continued episodes of syncope with prolonged standing with TEDS and abdominal binder donned.   Patient's care partner unable  to provide the necessary physical and supervision assistance at discharge.  Secondary to lack of 24/7 supervision and pt continues to be at a very high falls risk pt to D/C to SNF for more long term rehabilitation to reach higher level of independence prior to D/C home.    Reasons goals not met: Gait goal did not reach supervision secondary to pt requiring min A secondary to frequent syncope episodes with prolonged standing.    Recommendation:  Patient will benefit from ongoing skilled PT services in skilled nursing facility setting to continue to advance safe functional mobility, address ongoing impairments in LE weakness, pain in low back, impaired activity tolerance/endurance, impaired balance, and minimize fall risk.  Equipment: deferred to next venue of care  Reasons for discharge: discharge from hospital  Patient/family agrees with progress made and goals achieved: Yes  PT Discharge Pt received in recliner; pt reporting increased pain in low back and reports that they have been weaning her down on her pain medication and feels that the pain in her low back restricts her from participating fully in therapy.  Pt discussed pain issues with PA; PA made adjustments to pain medication to improve pain tolerance.  Pt agreeable to therapy.  Performed transfers w/c <> recliner without AD  and supervision.  Performed w/c mobility on unit with bilat UE and LE propulsion (without leg rests) mod I x 150'.  Performed re-assessment of bilat LE strength and sensation.  Performed stair negotiation training for continued home training and LE strengthening; pt performed up/down 5 stairs with two rails and min A with extra time needed to descend secondary to bilat knee pain (OA per pt report).  Upon sitting on mat pt suddenly fell backwards and became rigid in her trunk and unable to sit up without total A.  After a few seconds the pt was able to return to sitting independently at EOB; pt reports during the last step her vision went black but gave no indication to therapist while it was occuring.  Assessed BP in sitting and standing; see below.  Pt agreeable to perform gait short distance to w/c in hallway.  Performed gait with RW x 25' with impairments noted below; before therapist able to provide cues for gait pt became non-verbal and began to fall forwards; pt placed on therapist's knee until w/c could be brought behind pt with pt remaining non-responsive but continued to hold onto RW.  Pt placed in w/c and became responsive again once seated.  BP re-assessed and RN present to give pain medication; RN alerted of syncope episode.  Pt returned to room and to recliner with supervision to rest before next PT session.     Vital Signs Oxygen Therapy SpO2: 99 % O2 Device: Nasal cannula O2 Flow Rate (L/min): 3 L/min Pain Pain Assessment Pain Score: 5  Pain Type: Chronic pain Pain Location: Back Pain Descriptors / Indicators: Aching Pain Intervention(s): Medication (  See eMAR) Cognition Overall Cognitive Status: Within Functional Limits for tasks assessed Arousal/Alertness: Awake/alert Orientation Level: Oriented X4 Attention: Sustained Focused Attention: Appears intact Sustained Attention: Appears intact Memory: Appears intact Awareness: Appears intact Problem Solving: Appears  intact Safety/Judgment: Appears intact Sensation Sensation Light Touch: Appears Intact Light Touch Impaired Details: Impaired RLE;Impaired LLE Stereognosis: Not tested Hot/Cold: Not tested Proprioception: Appears Intact Additional Comments: Patient reports some numbness/tingling in digits 4&5 in right hand and in lower leg/feet decreased sensation to light touch Coordination Gross Motor Movements are Fluid and Coordinated: Yes Fine Motor Movements are Fluid and Coordinated: Yes Motor  Motor Motor: Motor impersistence Motor - Discharge Observations: Decreased strength and motor impersistence bilat LE R>L weakness  Mobility Transfers Stand Pivot Transfers: 5: Supervision;With armrests Locomotion  Ambulation Ambulation/Gait Assistance: 4: Min assist;1: +1 Total assist Ambulation Distance (Feet): 25 Feet Assistive device: Rolling walker Gait Gait Pattern: Impaired Gait Pattern: Decreased stride length;Step-through pattern;Trunk flexed;Wide base of support;Left flexed knee in stance;Right flexed knee in stance Stairs / Additional Locomotion Stairs: Yes Stairs Assistance: 4: Min assist Stair Management Technique: Two rails;Step to pattern;Forwards Number of Stairs: 5 Height of Stairs: 6 Product manager Assistance: 6: Modified independent (Device/Increase time) Environmental health practitioner: Both upper extremities;Both lower extermities Wheelchair Parts Management: Other (comment) (does not use leg rests; propels with UE and LE; mod I with brakes) Distance: 150  Trunk/Postural Assessment  Cervical Assessment Cervical Assessment: Within Functional Limits Thoracic Assessment Thoracic Assessment: Within Functional Limits Lumbar Assessment Lumbar Assessment: Within Functional Limits Postural Control Postural Control: Within Functional Limits  Balance Static Sitting Balance Static Sitting - Level of Assistance: 7: Independent Dynamic Sitting Balance Dynamic Sitting -  Level of Assistance: 6: Modified independent (Device/Increase time) Static Standing Balance Static Standing - Balance Support: Right upper extremity supported;Left upper extremity supported;No upper extremity supported Static Standing - Level of Assistance: 5: Stand by assistance Dynamic Standing Balance Dynamic Standing - Balance Support: Right upper extremity supported;Left upper extremity supported;No upper extremity supported Dynamic Standing - Level of Assistance: 5: Stand by assistance Extremity Assessment  RUE Assessment RUE Assessment: Within Functional Limits LUE Assessment LUE Assessment: Within Functional Limits RLE Assessment RLE Assessment: Exceptions to [facility] RLE Strength RLE Overall Strength: Deficits RLE Overall Strength Comments: 3-4/5; decreased functional endurance and motor impersistence LLE Assessment LLE Assessment: Exceptions to [facility] LLE Strength LLE Overall Strength: Deficits LLE Overall Strength Comments: 4/5  See FIM for current functional status  Raylene Everts [facility] 08/08/2014, 11:54 AM

## 2014-08-09 DIAGNOSIS — F411 Generalized anxiety disorder: Secondary | ICD-10-CM | POA: Diagnosis not present

## 2014-08-09 DIAGNOSIS — K219 Gastro-esophageal reflux disease without esophagitis: Secondary | ICD-10-CM | POA: Diagnosis not present

## 2014-08-09 DIAGNOSIS — G8929 Other chronic pain: Secondary | ICD-10-CM | POA: Diagnosis not present

## 2014-08-09 DIAGNOSIS — R269 Unspecified abnormalities of gait and mobility: Secondary | ICD-10-CM | POA: Diagnosis not present

## 2014-08-11 ENCOUNTER — Other Ambulatory Visit: Payer: Self-pay

## 2014-08-11 ENCOUNTER — Telehealth: Payer: Self-pay | Admitting: Gastroenterology

## 2014-08-11 DIAGNOSIS — G8929 Other chronic pain: Secondary | ICD-10-CM | POA: Diagnosis not present

## 2014-08-11 DIAGNOSIS — F411 Generalized anxiety disorder: Secondary | ICD-10-CM | POA: Diagnosis not present

## 2014-08-11 NOTE — Telephone Encounter (Signed)
Pt scheduled for hospital follow-up for gastritis 08/23/14@2 :15pm with Dr. Fuller Plan. Lisa aware of appt.

## 2014-08-11 NOTE — Consult Note (Signed)
  Neuropsychology Psychosocial Evaluation - Confidential  inpatient Rehabilitation _____________________________________________________________________________   Ms. Kiyanna Biegler was previously seen for a neurocognitive status examination to evaluate her emotional state and mental status in the setting of multiple medical problems with physical deconditioning.  In brief, her neurocognitive status examination was not suggestive of the presence of dementia.  However, owing to subjective report of cognitive changes, a comprehensive neuropsychological evaluation was recommended.  From an emotional standpoint, she displayed clinically significant depression and anxiety.  Therefore, she was seen in follow-up today for continued psychosocial support.    Today, Ms. Ritacco described continued grief over the death of her husband.  She also stated that she has been worried about her discharge plans and whether or not she will have someone to stay with her when she leaves.  In addition, she noted that she has been "blacking out," but not fully fainting; often during physical therapy.  She experienced one such episode during our session.  The nurse was called and her blood pressure was normal, but her Oxygen levels were reported to be low.  The nurse encouraged her to breathe deeply through her nose and after a few minutes, she reported feeling better.  Overall, Ms. Madura stated that she is "more cheerful" today and she thinks that her elevated mood is partially related to being able to stay in bed a bit longer this morning.  Time was spent today identifying coping strategies, including deep breathing.  We also processed how deep breathing seemed to help when she was feeling lightheaded and therefore, some of her lightheadedness could be related to anxious mood with shallow breathing.  Staff members may find it to be helpful to encourage her to breathe deeply during physical therapy and other stressful  situations to avoid syncopal episodes.  In addition, they may consider scheduling her with a later start to the day to improve overall mood. She stated that she is willing to consider engaging in individual psychotherapy and would like contact information for providers in her area.    From a cognitive standpoint, Ms. Carvey was observed to have trouble maintaining her train of thought when speaking.  Additionally, there were times when she responded to questions other than the ones that were posed.  It is possible that her increased cognitive difficulties today were related to reduced Oxygen levels, anxious mood in discussing her grief, or a combination of the two.  Ms. Raven stated that she continues to be interested in neuropsychological testing post-discharge and this will be scheduled with Dr. Beverly Gust, per her request.    DIAGNOSES: Physical deconditioning Bereavement Unspecified depressive disorder Unspecified anxiety disorder  Greater than 50% of this visit was spent educating the patient about the possible diagnosis, prognosis, management plan, and in coordination of care.   Marlane Hatcher, PsyD Clinical Neuropsychologist

## 2014-08-12 DIAGNOSIS — IMO0002 Reserved for concepts with insufficient information to code with codable children: Secondary | ICD-10-CM | POA: Diagnosis not present

## 2014-08-12 DIAGNOSIS — M545 Low back pain, unspecified: Secondary | ICD-10-CM | POA: Diagnosis not present

## 2014-08-15 NOTE — Progress Notes (Signed)
Social Work Discharge Note  The overall goal for the admission was met for:   Discharge location: No - pt's plan changed to short term SNF  Length of Stay: Yes - 13 days  Discharge activity level: No - had mod I goals, but discharged at supervision with some min assist   Home/community participation: No - Pt went for more therapy at [facility] provided included: MD, RD, PT, OT, RN, Pharmacy, Neuropsych and SW  Financial Services: Medicare and Private Insurance: [employer]  Follow-up services arranged: Other: Pt went to [facility] care.  Comments (or additional information):  Patient/Family verbalized understanding of follow-up arrangements: Yes  Individual responsible for coordination of the follow-up plan: pt with assistance from her two adult children  Confirmed correct DME delivered: Shelden Raborn, Silvestre Mesi 08/15/2014    Katalea Ucci, Silvestre Mesi

## 2014-08-16 DIAGNOSIS — F411 Generalized anxiety disorder: Secondary | ICD-10-CM | POA: Diagnosis not present

## 2014-08-16 DIAGNOSIS — G8929 Other chronic pain: Secondary | ICD-10-CM | POA: Diagnosis not present

## 2014-08-16 DIAGNOSIS — F4321 Adjustment disorder with depressed mood: Secondary | ICD-10-CM | POA: Diagnosis not present

## 2014-08-17 DIAGNOSIS — F411 Generalized anxiety disorder: Secondary | ICD-10-CM | POA: Diagnosis not present

## 2014-08-17 DIAGNOSIS — G8929 Other chronic pain: Secondary | ICD-10-CM | POA: Diagnosis not present

## 2014-08-19 DIAGNOSIS — G8929 Other chronic pain: Secondary | ICD-10-CM | POA: Diagnosis not present

## 2014-08-22 ENCOUNTER — Ambulatory Visit: Payer: Medicare Other | Admitting: Family Medicine

## 2014-08-23 ENCOUNTER — Ambulatory Visit: Payer: Medicare Other | Admitting: Gastroenterology

## 2014-08-23 NOTE — Consult Note (Signed)
I have reviewed and discussed the care of this patient in detail with the nurse practitioner including pertinent patient records, physical exam findings and data. I agree with details of this encounter.  

## 2014-08-24 ENCOUNTER — Other Ambulatory Visit (HOSPITAL_BASED_OUTPATIENT_CLINIC_OR_DEPARTMENT_OTHER): Payer: Medicare Other | Admitting: Lab

## 2014-08-24 ENCOUNTER — Ambulatory Visit (HOSPITAL_BASED_OUTPATIENT_CLINIC_OR_DEPARTMENT_OTHER): Payer: Medicare Other | Admitting: Family

## 2014-08-24 ENCOUNTER — Ambulatory Visit: Payer: Medicare Other

## 2014-08-24 VITALS — BP 102/58 | HR 77 | Temp 97.1°F | Resp 24 | Ht 64.0 in | Wt 177.0 lb

## 2014-08-24 DIAGNOSIS — M797 Fibromyalgia: Secondary | ICD-10-CM

## 2014-08-24 DIAGNOSIS — I2699 Other pulmonary embolism without acute cor pulmonale: Secondary | ICD-10-CM

## 2014-08-24 DIAGNOSIS — D509 Iron deficiency anemia, unspecified: Secondary | ICD-10-CM | POA: Diagnosis not present

## 2014-08-24 DIAGNOSIS — Z86718 Personal history of other venous thrombosis and embolism: Secondary | ICD-10-CM | POA: Diagnosis not present

## 2014-08-24 DIAGNOSIS — D510 Vitamin B12 deficiency anemia due to intrinsic factor deficiency: Secondary | ICD-10-CM

## 2014-08-24 DIAGNOSIS — J449 Chronic obstructive pulmonary disease, unspecified: Secondary | ICD-10-CM | POA: Diagnosis not present

## 2014-08-24 DIAGNOSIS — G9389 Other specified disorders of brain: Secondary | ICD-10-CM | POA: Insufficient documentation

## 2014-08-24 DIAGNOSIS — G4733 Obstructive sleep apnea (adult) (pediatric): Secondary | ICD-10-CM

## 2014-08-24 HISTORY — DX: Vitamin B12 deficiency anemia due to intrinsic factor deficiency: D51.0

## 2014-08-24 LAB — VITAMIN B12: Vitamin B-12: 500 pg/mL (ref 211–911)

## 2014-08-24 LAB — CMP (CANCER CENTER ONLY)
ALT(SGPT): 21 U/L (ref 10–47)
AST: 22 U/L (ref 11–38)
Albumin: 3.4 g/dL (ref 3.3–5.5)
Alkaline Phosphatase: 64 U/L (ref 26–84)
BILIRUBIN TOTAL: 0.6 mg/dL (ref 0.20–1.60)
BUN, Bld: 12 mg/dL (ref 7–22)
CALCIUM: 9.2 mg/dL (ref 8.0–10.3)
CO2: 30 meq/L (ref 18–33)
Chloride: 101 mEq/L (ref 98–108)
Creat: 0.7 mg/dl (ref 0.6–1.2)
GLUCOSE: 143 mg/dL — AB (ref 73–118)
Potassium: 3.5 mEq/L (ref 3.3–4.7)
SODIUM: 146 meq/L — AB (ref 128–145)
TOTAL PROTEIN: 7.1 g/dL (ref 6.4–8.1)

## 2014-08-24 LAB — CBC WITH DIFFERENTIAL (CANCER CENTER ONLY)
BASO#: 0 10*3/uL (ref 0.0–0.2)
BASO%: 0.6 % (ref 0.0–2.0)
EOS%: 1.8 % (ref 0.0–7.0)
Eosinophils Absolute: 0.1 10*3/uL (ref 0.0–0.5)
HEMATOCRIT: 35.9 % (ref 34.8–46.6)
HGB: 11.6 g/dL (ref 11.6–15.9)
LYMPH#: 1 10*3/uL (ref 0.9–3.3)
LYMPH%: 20.2 % (ref 14.0–48.0)
MCH: 30.2 pg (ref 26.0–34.0)
MCHC: 32.3 g/dL (ref 32.0–36.0)
MCV: 94 fL (ref 81–101)
MONO#: 0.5 10*3/uL (ref 0.1–0.9)
MONO%: 9.8 % (ref 0.0–13.0)
NEUT#: 3.3 10*3/uL (ref 1.5–6.5)
NEUT%: 67.6 % (ref 39.6–80.0)
Platelets: 163 10*3/uL (ref 145–400)
RBC: 3.84 10*6/uL (ref 3.70–5.32)
RDW: 13.1 % (ref 11.1–15.7)
WBC: 4.9 10*3/uL (ref 3.9–10.0)

## 2014-08-24 LAB — IRON AND TIBC CHCC
%SAT: 47 % (ref 21–57)
Iron: 109 ug/dL (ref 41–142)
TIBC: 231 ug/dL — AB (ref 236–444)
UIBC: 123 ug/dL (ref 120–384)

## 2014-08-24 LAB — RETICULOCYTES (CHCC)
ABS RETIC: 88.8 10*3/uL (ref 19.0–186.0)
RBC.: 3.86 MIL/uL — ABNORMAL LOW (ref 3.87–5.11)
RETIC CT PCT: 2.3 % (ref 0.4–2.3)

## 2014-08-24 LAB — FERRITIN CHCC: Ferritin: 2313 ng/ml — ABNORMAL HIGH (ref 9–269)

## 2014-08-24 LAB — CHCC SATELLITE - SMEAR

## 2014-08-24 LAB — PROTIME-INR (CHCC SATELLITE)
INR: 1 — ABNORMAL LOW (ref 2.0–3.5)
Protime: 12 Seconds (ref 10.6–13.4)

## 2014-08-24 LAB — SEDIMENTATION RATE: Sed Rate: 34 mm/hr — ABNORMAL HIGH (ref 0–22)

## 2014-08-24 MED ORDER — SODIUM CHLORIDE 0.9 % IV SOLN
510.0000 mg | Freq: Once | INTRAVENOUS | Status: DC
  Filled 2014-08-24: qty 17

## 2014-08-24 NOTE — Progress Notes (Signed)
[facility]  Telephone:([phone]) [phone] Fax:(336) [phone]  ID: [name] OB: [birth year] MR#: [id number]#:[id number] Patient Care Team: Tammi Sou, MD as PCP - General (Family Medicine) Volanda Napoleon, MD as Consulting Physician (Oncology) Tanda Rockers, MD as Consulting Physician (Pulmonary Disease) Campbell Lerner, MD as Consulting Physician (Rheumatology)  DIAGNOSIS: Idiopathic pulmonary embolism  [name] with chronic oxygen use  Recurrent iron deficiency anemia  Pernicious anemia  Congestive heart failure  INTERVAL HISTORY: Ms. Krizek is here today for a follow-up. She is still recuperating from her hospital stay and the death of her husband. She is still in a skilled nursing facility receiving physical therapy.She is still very tired and its making physical therapy hard. She has fibromyalgia and lots of aches and pains from this. She also has chronic back problems. Her appetite is ok and she tries to drink plenty of fluids. She has had no fever, chills, n/v, cough, rash, headache, dizziness, chest pain, palpitations, abdominal pain, diarrhea, blood in urine or stool. She takes Miralax to prevent constipation. She is on oxygen and has chronic lung issues from asbestos exposure.  I don't know how much of all her symptoms are related to depression. In July, her iron sat was 39 ferritin 1324. She is no longer on Coumadin. She has had no swelling, tenderness, numbness or tingling in her extremities.   CURRENT TREATMENT: IV iron as indicated  Vitamin B12 1 mg IM every month   REVIEW OF SYSTEMS: All other 10 point review of systems is negative except for those issues mentioned above.   PAST MEDICAL HISTORY: Past Medical History  Diagnosis Date  . HTN (hypertension)   . Depression   . Recurrent UTI     +hx of hospitalization for pyelonephritis  . Hay fever   . Mixed incontinence urge and stress   . Diverticular disease   . Insomnia   . Fibromyalgia      Patient states dx was around her late 51s but she had sx's for years prior to this.  . Syncope     +Hypotensive; ED visit--Dr. Terrence Dupont did Cath--nonobstructive CAD, EF 55-60%  . Idiopathic angio-edema-urticaria 72014    Angioedema component was very minimal  . 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 Asthma     + asbestososis   . History of pneumonia     hospitalized 12/2011, 02/2013, and 07/2013 [facility]) for this  . High cholesterol     "I'm on Lipitor cause it will help my heart in some way" (07/12/2013)  . Anginal pain     Nonobstructive CAD 2014  . OSA on CPAP   . Iron deficiency anemia     Hematologist in [place], [place] did extensive w/u; no cause found; failed oral supplement;; gets fairly regular (q38m or so) IV iron infusions (Venofer -iron sucrose, 200mg  with procrit.  "for 14 yr I've been getting blood work q month & getting infusions prn" (07/12/2013).  Dr. Marin Olp locally, iron infusions done, then erythropoietin deficiency diagnosed: aranesp to be started as of 09/2013.  . H/O hiatal hernia   . Migraine syndrome     "not as often anymore; used to be ~ q wk" (07/12/2013)  . Tension headache, chronic   . DDD (degenerative disc disease)     lumbar and cervical.   . Osteoarthritis     "severe; progressing fast" (07/12/2013)  .  Chronic lower back pain   . Anxiety     panic attacks  . Nephrolithiasis     "passed all on my own or they are still in there" (07/12/2013)  . Pyelonephritis     "several times over the last 30 yr" (07/12/2013)  . Diastolic congestive heart failure   . COPD (chronic obstructive pulmonary disease)   . Pulmonary embolism 07/2013    coumadin needed x 6 mo; Dx at [facility] with very small peripheral upper lobe pe 07/2013    . Pleural plaque with presence of asbestos 07/22/2013  . BPPV (benign paroxysmal positional vertigo) 12/16/2012  . RBBB (right bundle branch block)   .  Pernicious anemia 08/24/2014   PAST SURGICAL HISTORY: Past Surgical History  Procedure Laterality Date  . Appendectomy  1960  . Total abdominal hysterectomy  1974  . Tendon release  1996    Right forearm and hand  . Knee surgery  2005  . Heel spur surgery Left 2008  . Plantar fascia release Left 2008  . Axillary surgery Left 1978    Multiple "lump" in armpit per pt  . Coccyx removal  1972  . Cardiac catheterization  01/2013    nonobstructive CAD, EF 55-60%  . Transthoracic echocardiogram  01/2013; 04/2014    2014--NORMAL.  2015--focal basal septal hypertrophy, EF 55-60%, grade I diast dysfxn, mild LAE.    . Dilation and curettage of uterus  ? 1970's  . Eye surgery Left 2012-2013    "injections for ~ 1 yr; don't really know what for" (07/12/2013)  . Spirometry  04/25/14    In hosp for acute asthma/COPD flare: mixed obstructive and restrictive lung disease. The FEV1 is severely reduced at 45% predicted.  FEV1 signif decreased compared to prior spirometry 07/23/13.  [name] Kitchen Esophagogastroduodenoscopy N/A 07/19/2014    Procedure: ESOPHAGOGASTRODUODENOSCOPY ;  Surgeon: Ladene Artist, MD;  Location: [facility] ENDOSCOPY;  Service: Endoscopy;  Laterality: N/A;   FAMILY HISTORY Family History  Problem Relation Age of Onset  . Arthritis Mother   . Kidney disease Mother   . Heart disease Father   . Stroke Father   . Hypertension Father   . Diabetes Father    GYNECOLOGIC HISTORY:  No LMP recorded. Patient has had a hysterectomy.   SOCIAL HISTORY:  History   Social History  . Marital Status: Widowed    Spouse Name: N/A    Number of Children: N/A  . Years of Education: N/A   Occupational History  . Not on file.   Social History Main Topics  . Smoking status: Never Smoker   . Smokeless tobacco: Never Used     Comment: never used tobacco  . Alcohol Use: No  . Drug Use: No  . Sexual Activity: Not Currently   Other Topics Concern  . Not on file   Social History Narrative   Widowed, 2 sons.   Relocated to [place] 09/2012 to be closer to her son who has MS.   Husband d 2015--mesothelioma.   Occupation: former Pharmacist, hospital.   Education: masters degree level.   No T/A/Ds.   ADVANCED DIRECTIVES: <no information>  HEALTH MAINTENANCE: History  Substance Use Topics  . Smoking status: Never Smoker   . Smokeless tobacco: Never Used     Comment: never used tobacco  . Alcohol Use: No   Colonoscopy: PAP: Bone density: Lipid panel:  Allergies  Allergen Reactions  . Penicillins Itching, Swelling and Rash    Tolerated Cefepime in ED.   Current Outpatient  Prescriptions  Medication Sig Dispense Refill  . albuterol (PROVENTIL HFA;VENTOLIN HFA) 108 (90 BASE) MCG/ACT inhaler Inhale 2 puffs into the lungs every 6 (six) hours as needed for wheezing or shortness of breath.       [name] Kitchen albuterol (PROVENTIL) (2.5 MG/3ML) 0.083% nebulizer solution Take 3 mLs (2.5 mg total) by nebulization every 6 (six) hours as needed for wheezing or shortness of breath.  75 mL  1  . ALPRAZolam (XANAX) 1 MG tablet Take 1 tablet (1 mg total) by mouth 3 (three) times daily.  15 tablet  0  . atorvastatin (LIPITOR) 20 MG tablet Take 1 tablet (20 mg total) by mouth daily at 6 PM.  30 tablet  6  . DULoxetine 40 MG CPEP Take 40 mg by mouth daily.    3  . folic acid (FOLVITE) 1 MG tablet Take 2 tablets (2 mg total) by mouth daily.      . hydrocerin (EUCERIN) CREA Apply 1 application topically 2 (two) times daily. To dry flaky areas    0  . isosorbide mononitrate (IMDUR) 15 mg TB24 24 hr tablet Take 0.5 tablets (15 mg total) by mouth daily.      . Multiple Vitamins-Minerals (CENTRUM SILVER) tablet Take 1 tablet by mouth daily.       . nebivolol (BYSTOLIC) 5 MG tablet Take 5 mg by mouth daily.      . nitroGLYCERIN (NITROSTAT) 0.4 MG SL tablet Place 1 tablet (0.4 mg total) under the tongue every 5 (five) minutes x 3 doses as needed for chest pain.  60 tablet  3  . oxyCODONE 10 MG TABS Take 1 tablet (10 mg total) by mouth every 8  (eight) hours as needed for severe pain.  15 tablet  0  . pantoprazole (PROTONIX) 40 MG tablet Take 1 tablet (40 mg total) by mouth daily at 6 (six) AM.      . polyethylene glycol (MIRALAX / GLYCOLAX) packet Take 17 g by mouth daily.  14 each  0  . pregabalin (LYRICA) 200 MG capsule Take one capsule in the morning and 2 capsule at bedtime  15 capsule  0  . senna-docusate (SENOKOT-S) 8.6-50 MG per tablet Take 2 tablets by mouth at bedtime.      . traMADol (ULTRAM) 50 MG tablet Take 1 tablet (50 mg total) by mouth every 6 (six) hours as needed for moderate pain.  30 tablet  0  . traZODone (DESYREL) 50 MG tablet Take 3 tablets (150 mg total) by mouth at bedtime.      . triamcinolone cream (KENALOG) 0.1 % Apply topically daily.  30 g  0   No current facility-administered medications for this visit.   OBJECTIVE: Filed Vitals:   08/24/14 1218  BP: 102/58  Pulse: 77  Temp: 97.1 F (36.2 C)  Resp: 24   Body mass index is 30.37 kg/(m^2). ECOG FS:1 - Symptomatic but completely ambulatory Ocular: Sclerae unicteric, pupils equal, round and reactive to light Ear-nose-throat: Oropharynx clear, dentition fair Lymphatic: No cervical or supraclavicular adenopathy Lungs no rales or rhonchi, good excursion bilaterally Heart regular rate and rhythm, no murmur appreciated Abd soft, nontender, positive bowel sounds MSK no focal spinal tenderness, no joint edema Neuro: non-focal, well-oriented, appropriate affect Breasts: Deferred  LAB RESULTS: CMP     Component Value Date/Time   NA 146* 08/24/2014 1124   NA 140 08/02/2014 1410   K 3.5 08/24/2014 1124   K 3.7 08/02/2014 1410   CL 101 08/24/2014 1124  CL 99 08/02/2014 1410   CO2 30 08/24/2014 1124   CO2 26 08/02/2014 1410   GLUCOSE 143* 08/24/2014 1124   GLUCOSE 121* 08/02/2014 1410   BUN 12 08/24/2014 1124   BUN 14 08/02/2014 1410   CREATININE 0.7 08/24/2014 1124   CREATININE 0.68 08/02/2014 1410   CALCIUM 9.2 08/24/2014 1124   CALCIUM 9.0 08/02/2014 1410   PROT  7.1 08/24/2014 1124   PROT 6.6 07/27/2014 0540   ALBUMIN 3.4* 07/27/2014 0540   AST 22 08/24/2014 1124   AST 20 07/27/2014 0540   ALT 21 08/24/2014 1124   ALT 16 07/27/2014 0540   ALKPHOS 64 08/24/2014 1124   ALKPHOS 68 07/27/2014 0540   BILITOT 0.60 08/24/2014 1124   BILITOT 0.3 07/27/2014 0540   GFRNONAA 88* 08/02/2014 1410   GFRAA >90 08/02/2014 1410   No results found for this basename: SPEP, UPEP,  kappa and lambda light chains   Lab Results  Component Value Date   WBC 4.9 08/24/2014   NEUTROABS 3.3 08/24/2014   HGB 11.6 08/24/2014   HCT 35.9 08/24/2014   MCV 94 08/24/2014   PLT 163 08/24/2014   No results found for this basename: LABCA2   No components found with this basename: LABCA125    Recent Labs Lab 08/24/14 1124  INR 1.0*   STUDIES: No results found.  ASSESSMENT/PLAN: Ms. Mcnay is 68 year old female with multiple medical problems. She has iron deficiency anemia. She is symptomatic with fatigue.  Her CBC today looked good. We will see what the rest of her labs show.  We will go ahead and schedule her for iron and B12 this week or next.  We will also get an MRI or the brain in the next few weeks to evaluate her 13 mm mass that is arising off the right petrous bone.  We will see her back in 2 months for labs and follow-up.  She is in agreement with this and knows to call here with any questions or concerns. We can certainly see her back sooner if need be.   Eliezer Bottom, NP 08/24/2014 1:23 PM

## 2014-08-25 ENCOUNTER — Encounter: Payer: Self-pay | Admitting: *Deleted

## 2014-08-25 ENCOUNTER — Ambulatory Visit (HOSPITAL_BASED_OUTPATIENT_CLINIC_OR_DEPARTMENT_OTHER): Payer: Medicare Other

## 2014-08-25 VITALS — BP 104/63 | HR 73 | Temp 97.4°F

## 2014-08-25 DIAGNOSIS — D510 Vitamin B12 deficiency anemia due to intrinsic factor deficiency: Secondary | ICD-10-CM

## 2014-08-25 DIAGNOSIS — D509 Iron deficiency anemia, unspecified: Secondary | ICD-10-CM

## 2014-08-25 DIAGNOSIS — I2699 Other pulmonary embolism without acute cor pulmonale: Secondary | ICD-10-CM

## 2014-08-25 MED ORDER — DIPHENHYDRAMINE HCL 25 MG PO CAPS: 25.0000 mg | ORAL_CAPSULE | Freq: Once | ORAL | Status: DC

## 2014-08-25 MED ORDER — SODIUM CHLORIDE 0.9 % IV SOLN
1020.0000 mg | Freq: Once | INTRAVENOUS | Status: DC
  Filled 2014-08-25: qty 34

## 2014-08-25 MED ORDER — FAMOTIDINE IN NACL 20-0.9 MG/50ML-% IV SOLN
INTRAVENOUS | Status: AC
  Filled 2014-08-25: qty 100

## 2014-08-25 MED ORDER — CYANOCOBALAMIN 1000 MCG/ML IJ SOLN
INTRAMUSCULAR | Status: AC
  Filled 2014-08-25: qty 1

## 2014-08-25 MED ORDER — SODIUM CHLORIDE 0.9 % IV SOLN: Freq: Once | INTRAVENOUS | Status: DC

## 2014-08-25 MED ORDER — DIPHENHYDRAMINE HCL 25 MG PO CAPS
ORAL_CAPSULE | ORAL | Status: AC
  Filled 2014-08-25: qty 1

## 2014-08-25 MED ORDER — FAMOTIDINE IN NACL 20-0.9 MG/50ML-% IV SOLN: 40.0000 mg | Freq: Two times a day (BID) | INTRAVENOUS | Status: DC

## 2014-08-25 MED ORDER — CYANOCOBALAMIN 1000 MCG/ML IJ SOLN
1000.0000 ug | Freq: Once | INTRAMUSCULAR | Status: AC
  Administered 2014-08-25: 1000 ug via INTRAMUSCULAR

## 2014-08-25 NOTE — Progress Notes (Signed)
After further review of lab, [place] NP has decided not to give Iron today.

## 2014-08-25 NOTE — Progress Notes (Signed)
08/24/14 office note faxed to patients assisted living facility attn Wingsott [phone] per facility request

## 2014-08-25 NOTE — Patient Instructions (Signed)

## 2014-08-26 ENCOUNTER — Telehealth: Payer: Self-pay | Admitting: *Deleted

## 2014-08-26 NOTE — Telephone Encounter (Signed)
Patients son Gershon Mussel called with some concerns and information about his mother.  He wanted to let us know as his mothers POA that he requests that we not give his mother any narcotics.  He wanted to let us know that his mother has been hoarding and stashing away narcotics and abuses them and took them in the hospital even while they were giving her scheduled narcotics.  Gave this information to Dr. Marin Olp and Domingo Pulse NP and documented it as an allergy and elsewhere in the chart.

## 2014-08-30 DIAGNOSIS — G4700 Insomnia, unspecified: Secondary | ICD-10-CM | POA: Diagnosis not present

## 2014-08-30 DIAGNOSIS — D649 Anemia, unspecified: Secondary | ICD-10-CM | POA: Diagnosis not present

## 2014-08-30 DIAGNOSIS — F411 Generalized anxiety disorder: Secondary | ICD-10-CM | POA: Diagnosis not present

## 2014-08-30 DIAGNOSIS — IMO0001 Reserved for inherently not codable concepts without codable children: Secondary | ICD-10-CM | POA: Diagnosis not present

## 2014-09-02 ENCOUNTER — Other Ambulatory Visit: Payer: Self-pay | Admitting: Family Medicine

## 2014-09-02 DIAGNOSIS — N390 Urinary tract infection, site not specified: Secondary | ICD-10-CM | POA: Diagnosis not present

## 2014-09-03 ENCOUNTER — Ambulatory Visit (HOSPITAL_BASED_OUTPATIENT_CLINIC_OR_DEPARTMENT_OTHER)
Admission: RE | Admit: 2014-09-03 | Discharge: 2014-09-03 | Disposition: A | Discharge status: Home / Self Care | Payer: Medicare Other | Source: Ambulatory Visit | Attending: Family | Admitting: Family

## 2014-09-03 ENCOUNTER — Other Ambulatory Visit (HOSPITAL_BASED_OUTPATIENT_CLINIC_OR_DEPARTMENT_OTHER): Payer: Medicare Other

## 2014-09-03 ENCOUNTER — Other Ambulatory Visit: Payer: Self-pay | Admitting: Family

## 2014-09-03 DIAGNOSIS — R930 Abnormal findings on diagnostic imaging of skull and head, not elsewhere classified: Secondary | ICD-10-CM | POA: Insufficient documentation

## 2014-09-03 DIAGNOSIS — G9389 Other specified disorders of brain: Secondary | ICD-10-CM

## 2014-09-03 DIAGNOSIS — R55 Syncope and collapse: Secondary | ICD-10-CM | POA: Diagnosis not present

## 2014-09-05 DIAGNOSIS — IMO0002 Reserved for concepts with insufficient information to code with codable children: Secondary | ICD-10-CM | POA: Diagnosis not present

## 2014-09-05 DIAGNOSIS — M5137 Other intervertebral disc degeneration, lumbosacral region: Secondary | ICD-10-CM | POA: Diagnosis not present

## 2014-09-05 DIAGNOSIS — M25559 Pain in unspecified hip: Secondary | ICD-10-CM | POA: Diagnosis not present

## 2014-09-05 DIAGNOSIS — M1990 Unspecified osteoarthritis, unspecified site: Secondary | ICD-10-CM | POA: Diagnosis not present

## 2014-09-05 DIAGNOSIS — M2550 Pain in unspecified joint: Secondary | ICD-10-CM | POA: Diagnosis not present

## 2014-09-05 DIAGNOSIS — IMO0001 Reserved for inherently not codable concepts without codable children: Secondary | ICD-10-CM | POA: Diagnosis not present

## 2014-09-12 DIAGNOSIS — G8929 Other chronic pain: Secondary | ICD-10-CM | POA: Diagnosis not present

## 2014-09-12 DIAGNOSIS — K5900 Constipation, unspecified: Secondary | ICD-10-CM | POA: Diagnosis not present

## 2014-09-12 DIAGNOSIS — F411 Generalized anxiety disorder: Secondary | ICD-10-CM | POA: Diagnosis not present

## 2014-09-12 DIAGNOSIS — IMO0001 Reserved for inherently not codable concepts without codable children: Secondary | ICD-10-CM | POA: Diagnosis not present

## 2014-09-12 DIAGNOSIS — IMO0002 Reserved for concepts with insufficient information to code with codable children: Secondary | ICD-10-CM | POA: Diagnosis not present

## 2014-09-14 ENCOUNTER — Telehealth: Payer: Self-pay | Admitting: *Deleted

## 2014-09-14 ENCOUNTER — Ambulatory Visit (HOSPITAL_COMMUNITY): Payer: Medicare Other

## 2014-09-14 NOTE — Telephone Encounter (Signed)
Mia, RN called with question about pt having black, tarry stools. Informed Mia that the pt needs to go to the ED. Mia verbalized understanding.

## 2014-09-15 ENCOUNTER — Other Ambulatory Visit: Payer: Self-pay

## 2014-09-15 ENCOUNTER — Emergency Department (HOSPITAL_COMMUNITY)
Admission: EM | Admit: 2014-09-15 | Discharge: 2014-09-15 | Disposition: A | Discharge status: Home / Self Care | Payer: Medicare Other | Attending: Emergency Medicine | Admitting: Emergency Medicine

## 2014-09-15 ENCOUNTER — Encounter (HOSPITAL_COMMUNITY): Payer: Self-pay | Admitting: Emergency Medicine

## 2014-09-15 ENCOUNTER — Telehealth: Payer: Self-pay

## 2014-09-15 ENCOUNTER — Emergency Department (HOSPITAL_COMMUNITY): Payer: Medicare Other

## 2014-09-15 ENCOUNTER — Other Ambulatory Visit: Payer: Self-pay | Admitting: Family Medicine

## 2014-09-15 DIAGNOSIS — Z86711 Personal history of pulmonary embolism: Secondary | ICD-10-CM | POA: Insufficient documentation

## 2014-09-15 DIAGNOSIS — K921 Melena: Secondary | ICD-10-CM

## 2014-09-15 DIAGNOSIS — R0602 Shortness of breath: Secondary | ICD-10-CM | POA: Diagnosis not present

## 2014-09-15 DIAGNOSIS — R110 Nausea: Secondary | ICD-10-CM | POA: Diagnosis not present

## 2014-09-15 DIAGNOSIS — I509 Heart failure, unspecified: Secondary | ICD-10-CM | POA: Insufficient documentation

## 2014-09-15 DIAGNOSIS — J40 Bronchitis, not specified as acute or chronic: Secondary | ICD-10-CM | POA: Diagnosis not present

## 2014-09-15 DIAGNOSIS — J45901 Unspecified asthma with (acute) exacerbation: Secondary | ICD-10-CM | POA: Diagnosis not present

## 2014-09-15 DIAGNOSIS — Z79899 Other long term (current) drug therapy: Secondary | ICD-10-CM | POA: Diagnosis not present

## 2014-09-15 DIAGNOSIS — R42 Dizziness and giddiness: Secondary | ICD-10-CM | POA: Diagnosis not present

## 2014-09-15 DIAGNOSIS — M7989 Other specified soft tissue disorders: Secondary | ICD-10-CM

## 2014-09-15 DIAGNOSIS — E876 Hypokalemia: Secondary | ICD-10-CM | POA: Insufficient documentation

## 2014-09-15 DIAGNOSIS — J45909 Unspecified asthma, uncomplicated: Secondary | ICD-10-CM | POA: Diagnosis not present

## 2014-09-15 DIAGNOSIS — D62 Acute posthemorrhagic anemia: Secondary | ICD-10-CM

## 2014-09-15 DIAGNOSIS — J449 Chronic obstructive pulmonary disease, unspecified: Secondary | ICD-10-CM | POA: Diagnosis not present

## 2014-09-15 DIAGNOSIS — Z9889 Other specified postprocedural states: Secondary | ICD-10-CM | POA: Diagnosis not present

## 2014-09-15 DIAGNOSIS — Z880 Allergy status to penicillin: Secondary | ICD-10-CM | POA: Insufficient documentation

## 2014-09-15 DIAGNOSIS — R079 Chest pain, unspecified: Secondary | ICD-10-CM | POA: Diagnosis not present

## 2014-09-15 DIAGNOSIS — I10 Essential (primary) hypertension: Secondary | ICD-10-CM | POA: Diagnosis not present

## 2014-09-15 LAB — URINALYSIS, ROUTINE W REFLEX MICROSCOPIC
Bilirubin Urine: NEGATIVE
GLUCOSE, UA: NEGATIVE mg/dL
Hgb urine dipstick: NEGATIVE
KETONES UR: 15 mg/dL — AB
LEUKOCYTES UA: NEGATIVE
NITRITE: NEGATIVE
Protein, ur: NEGATIVE mg/dL
Specific Gravity, Urine: 1.023 (ref 1.005–1.030)
Urobilinogen, UA: 0.2 mg/dL (ref 0.0–1.0)
pH: 6.5 (ref 5.0–8.0)

## 2014-09-15 LAB — PRO B NATRIURETIC PEPTIDE: Pro B Natriuretic peptide (BNP): 209.3 pg/mL — ABNORMAL HIGH (ref 0–125)

## 2014-09-15 LAB — POC OCCULT BLOOD, ED: Fecal Occult Bld: POSITIVE — AB

## 2014-09-15 LAB — CBC
HCT: 37.5 % (ref 36.0–46.0)
Hemoglobin: 12.8 g/dL (ref 12.0–15.0)
MCH: 30 pg (ref 26.0–34.0)
MCHC: 34.1 g/dL (ref 30.0–36.0)
MCV: 87.8 fL (ref 78.0–100.0)
Platelets: 206 10*3/uL (ref 150–400)
RBC: 4.27 MIL/uL (ref 3.87–5.11)
RDW: 13.2 % (ref 11.5–15.5)
WBC: 8.3 10*3/uL (ref 4.0–10.5)

## 2014-09-15 LAB — BASIC METABOLIC PANEL
Anion gap: 14 (ref 5–15)
BUN: 16 mg/dL (ref 6–23)
CO2: 26 mEq/L (ref 19–32)
Calcium: 9.5 mg/dL (ref 8.4–10.5)
Chloride: 100 mEq/L (ref 96–112)
Creatinine, Ser: 0.69 mg/dL (ref 0.50–1.10)
GFR calc Af Amer: >90 mL/min (ref >90)
GFR calc non Af Amer: 87 mL/min — ABNORMAL LOW (ref >90)
Glucose, Bld: 107 mg/dL — ABNORMAL HIGH (ref 70–99)
Potassium: 3.1 mEq/L — ABNORMAL LOW (ref 3.7–5.3)
Sodium: 140 mEq/L (ref 137–147)

## 2014-09-15 LAB — I-STAT TROPONIN, ED
Troponin i, poc: 0 ng/mL (ref 0.00–0.08)
Troponin i, poc: 0 ng/mL (ref 0.00–0.08)

## 2014-09-15 LAB — TYPE AND SCREEN
ABO/RH(D): A POS
Antibody Screen: NEGATIVE

## 2014-09-15 MED ORDER — NITROGLYCERIN 0.4 MG SL SUBL: 0.4000 mg | SUBLINGUAL_TABLET | SUBLINGUAL | Status: DC | PRN

## 2014-09-15 MED ORDER — PREGABALIN 200 MG PO CAPS: ORAL_CAPSULE | ORAL | Status: DC

## 2014-09-15 MED ORDER — PREGABALIN 100 MG PO CAPS: 100.0000 mg | ORAL_CAPSULE | Freq: Two times a day (BID) | ORAL | Status: DC

## 2014-09-15 MED ORDER — ONDANSETRON HCL 4 MG/2ML IJ SOLN
4.0000 mg | Freq: Once | INTRAMUSCULAR | Status: AC
  Administered 2014-09-15: 4 mg via INTRAVENOUS
  Filled 2014-09-15: qty 2

## 2014-09-15 MED ORDER — ALBUTEROL SULFATE (2.5 MG/3ML) 0.083% IN NEBU
5.0000 mg | INHALATION_SOLUTION | Freq: Once | RESPIRATORY_TRACT | Status: AC
  Administered 2014-09-15: 5 mg via RESPIRATORY_TRACT
  Filled 2014-09-15: qty 6

## 2014-09-15 MED ORDER — IOHEXOL 350 MG/ML SOLN
100.0000 mL | Freq: Once | INTRAVENOUS | Status: AC | PRN
  Administered 2014-09-15: 100 mL via INTRAVENOUS

## 2014-09-15 MED ORDER — ASPIRIN 81 MG PO CHEW
CHEWABLE_TABLET | ORAL | Status: AC
  Filled 2014-09-15: qty 2

## 2014-09-15 MED ORDER — FENTANYL 25 MCG/HR TD PT72: 25.0000 ug | MEDICATED_PATCH | TRANSDERMAL | Status: DC

## 2014-09-15 MED ORDER — POTASSIUM CHLORIDE CRYS ER 20 MEQ PO TBCR
40.0000 meq | EXTENDED_RELEASE_TABLET | Freq: Once | ORAL | Status: AC
  Administered 2014-09-15: 40 meq via ORAL
  Filled 2014-09-15: qty 2

## 2014-09-15 MED ORDER — NEBIVOLOL HCL 5 MG PO TABS: 5.0000 mg | ORAL_TABLET | Freq: Every day | ORAL | Status: DC

## 2014-09-15 MED ORDER — TRAZODONE HCL 50 MG PO TABS: 150.0000 mg | ORAL_TABLET | Freq: Every day | ORAL | Status: DC

## 2014-09-15 MED ORDER — ALBUTEROL SULFATE (2.5 MG/3ML) 0.083% IN NEBU: 2.5000 mg | INHALATION_SOLUTION | Freq: Four times a day (QID) | RESPIRATORY_TRACT | Status: DC | PRN

## 2014-09-15 MED ORDER — ASPIRIN 81 MG PO CHEW
324.0000 mg | CHEWABLE_TABLET | Freq: Once | ORAL | Status: AC
  Administered 2014-09-15: 324 mg via ORAL
  Filled 2014-09-15: qty 4

## 2014-09-15 MED ORDER — DICLOFENAC SODIUM 1 % TD GEL: 4.0000 g | Freq: Four times a day (QID) | TRANSDERMAL | Status: DC

## 2014-09-15 MED ORDER — DULOXETINE HCL 60 MG PO CPEP: 60.0000 mg | ORAL_CAPSULE | Freq: Every day | ORAL | Status: DC

## 2014-09-15 MED ORDER — ONDANSETRON HCL 4 MG PO TABS: 4.0000 mg | ORAL_TABLET | Freq: Four times a day (QID) | ORAL | Status: DC

## 2014-09-15 MED ORDER — POLYETHYLENE GLYCOL 3350 17 GM/SCOOP PO POWD: 17.0000 g | Freq: Every day | ORAL | Status: DC

## 2014-09-15 MED ORDER — VITAMIN D3 1.25 MG (50000 UT) PO CAPS: 50000.0000 [IU] | ORAL_CAPSULE | ORAL | Status: DC

## 2014-09-15 MED ORDER — ALPRAZOLAM 1 MG PO TABS: 1.0000 mg | ORAL_TABLET | Freq: Three times a day (TID) | ORAL | Status: DC

## 2014-09-15 MED ORDER — ALBUTEROL SULFATE HFA 108 (90 BASE) MCG/ACT IN AERS: 2.0000 | INHALATION_SPRAY | Freq: Four times a day (QID) | RESPIRATORY_TRACT | Status: DC | PRN

## 2014-09-15 MED ORDER — ATORVASTATIN CALCIUM 20 MG PO TABS: 20.0000 mg | ORAL_TABLET | Freq: Every day | ORAL | Status: DC

## 2014-09-15 MED ORDER — FOLIC ACID 1 MG PO TABS: 2.0000 mg | ORAL_TABLET | Freq: Every day | ORAL | Status: DC

## 2014-09-15 MED ORDER — ISOSORBIDE MONONITRATE ER 30 MG PO TB24: 30.0000 mg | ORAL_TABLET | Freq: Every day | ORAL | Status: DC

## 2014-09-15 MED ORDER — PANTOPRAZOLE SODIUM 40 MG PO TBEC: 40.0000 mg | DELAYED_RELEASE_TABLET | Freq: Every day | ORAL | Status: DC

## 2014-09-15 MED ORDER — ONDANSETRON HCL 4 MG PO TABS: 4.0000 mg | ORAL_TABLET | Freq: Two times a day (BID) | ORAL | Status: DC | PRN

## 2014-09-15 NOTE — ED Provider Notes (Signed)
CSN: [id number]     Arrival date & time 09/15/14  1437 History   First MD Initiated Contact with Patient 09/15/14 1544     Chief Complaint  Patient presents with  . Shortness of Breath     (Consider location/radiation/quality/duration/timing/severity/associated sxs/prior Treatment) HPI Comments: Patient with a history of Asthma, CHF, [name], and PE presents today with a chief complaint of SOB, lightheadedness, nausea, vomiting, and black stools.  She reports that she began having symptoms yesterday morning and are gradually worsening.  She reports that she had similar symptoms in July of 2015 when she was diagnosed with an upper GI bleed.  She denies any blood in her emesis.  She is currently not on any anticoagulants.  She had previously been on Coumadin for history of PE, but was taken off of the Coumadin in July of 2015.  She reports that she is on chronic 3 L [place] oxygen.  She denies chest pain, fever, or chills.  She does report occassional productive cough.  She also reports mild diffuse abdominal pain.  Pain does not radiate.  She reports regular bowel movements.  No diarrhea or constipation.  She denies prolonged travel or surgeries in the past 4 weeks.  Denies LE edema or pain.    The history is provided by the patient.     Past Surgical History  Procedure Laterality Date  . Appendectomy  1960  . Total abdominal hysterectomy  1974  . Tendon release  1996    Right forearm and hand  . Knee surgery  2005  . Heel spur surgery Left 2008  . Plantar fascia release Left 2008  . Axillary surgery Left 1978    Multiple "lump" in armpit per pt  . Coccyx removal  1972  . Cardiac catheterization  01/2013    nonobstructive CAD, EF 55-60%  . Transthoracic echocardiogram  01/2013; 04/2014    2014--NORMAL.  2015--focal basal septal hypertrophy, EF 55-60%, grade I diast dysfxn, mild LAE.    . Dilation and curettage of uterus  ? 1970's  . Eye surgery Left 2012-2013    "injections for ~ 1 yr;  don't really know what for" (07/12/2013)  . Spirometry  04/25/14    In hosp for acute asthma/COPD flare: mixed obstructive and restrictive lung disease. The FEV1 is severely reduced at 45% predicted.  FEV1 signif decreased compared to prior spirometry 07/23/13.  [name] Kitchen Esophagogastroduodenoscopy N/A 07/19/2014    Procedure: ESOPHAGOGASTRODUODENOSCOPY ;  Surgeon: Ladene Artist, MD;  Location: [facility] ENDOSCOPY;  Service: Endoscopy;  Laterality: N/A;   Family History  Problem Relation Age of Onset  . Arthritis Mother   . Kidney disease Mother   . Heart disease Father   . Stroke Father   . Hypertension Father   . Diabetes Father    History  Substance Use Topics  . Smoking status: Never Smoker   . Smokeless tobacco: Never Used     Comment: never used tobacco  . Alcohol Use: No   OB History   Grav Para Term Preterm Abortions TAB SAB Ect Mult Living                 Review of Systems  All other systems reviewed and are negative.     Allergies  Oxycodone and Penicillins  Home Medications   Prior to Admission medications   Medication Sig Start Date End Date Taking? Authorizing Provider  albuterol (PROVENTIL HFA;VENTOLIN HFA) 108 (90 BASE) MCG/ACT inhaler Inhale 2 puffs into the lungs every  6 (six) hours as needed for wheezing or shortness of breath.    Yes Historical Provider, MD  ALPRAZolam Duanne Moron) 1 MG tablet Take 1 tablet (1 mg total) by mouth 3 (three) times daily. 09/15/14  Yes Tammi Sou, MD  atorvastatin (LIPITOR) 20 MG tablet TAKE 1 TABLET (20 MG TOTAL) BY MOUTH DAILY AT 6 PM. 09/02/14  Yes Tammi Sou, MD  folic acid (FOLVITE) 1 MG tablet Take 2 tablets (2 mg total) by mouth daily. 08/08/14  Yes Ivan Anchors Love, PA-C  hydrocerin (EUCERIN) CREA Apply 1 application topically 2 (two) times daily. To dry flaky areas 08/08/14  Yes Ivan Anchors Love, PA-C  isosorbide mononitrate (IMDUR) 15 mg TB24 24 hr tablet Take 0.5 tablets (15 mg total) by mouth daily. 08/08/14  Yes Ivan Anchors Love, PA-C   nebivolol (BYSTOLIC) 5 MG tablet Take 5 mg by mouth daily.   Yes Historical Provider, MD  nitroGLYCERIN (NITROSTAT) 0.4 MG SL tablet Place 1 tablet (0.4 mg total) under the tongue every 5 (five) minutes x 3 doses as needed for chest pain. 07/19/13  Yes Reyne Dumas, MD  oxyCODONE 10 MG TABS Take 1 tablet (10 mg total) by mouth every 8 (eight) hours as needed for severe pain. 08/08/14  Yes Ivan Anchors Love, PA-C  pregabalin (LYRICA) 200 MG capsule Take one capsule in the morning and 2 capsule at bedtime 08/08/14  Yes Ivan Anchors Love, PA-C  traMADol (ULTRAM) 50 MG tablet Take 1 tablet (50 mg total) by mouth every 6 (six) hours as needed for moderate pain. 08/08/14  Yes Ivan Anchors Love, PA-C  traZODone (DESYREL) 50 MG tablet Take 3 tablets (150 mg total) by mouth at bedtime. 08/08/14  Yes Ivan Anchors Love, PA-C  albuterol (PROVENTIL) (2.5 MG/3ML) 0.083% nebulizer solution Take 3 mLs (2.5 mg total) by nebulization every 6 (six) hours as needed for wheezing or shortness of breath. 02/11/14   Tammi Sou, MD  DULoxetine 40 MG CPEP Take 40 mg by mouth daily. 08/08/14   Ivan Anchors Love, PA-C   BP 108/85  Pulse 84  Temp(Src) 98.4 F (36.9 C)  Resp 22  Ht 5\' 4"  (1.626 m)  Wt 178 lb (80.74 kg)  BMI 30.54 kg/m2  SpO2 97% Physical Exam  Nursing note and vitals reviewed. Constitutional: She appears well-developed and well-nourished.  HENT:  Head: Normocephalic and atraumatic.  Mouth/Throat: Oropharynx is clear and moist.  Eyes: EOM are normal. Pupils are equal, round, and reactive to light.  Neck: Normal range of motion. Neck supple.  Cardiovascular: Normal rate, regular rhythm and normal heart sounds.   Pulses:      Dorsalis pedis pulses are 2+ on the right side, and 2+ on the left side.  Pulmonary/Chest: Effort normal and breath sounds normal. No respiratory distress.  Abdominal: Soft. Bowel sounds are normal. She exhibits no distension and no mass. There is tenderness. There is no rebound and no guarding.   Mild diffuse abdominal pain  Musculoskeletal:  No LE edema or erythema  Neurological: She is alert.  Skin: Skin is warm and dry.  Psychiatric: She has a normal mood and affect.    ED Course  Procedures (including critical care time) Labs Review Labs Reviewed  BASIC METABOLIC PANEL - Abnormal; Notable for the following:    Potassium 3.1 (*)    Glucose, Bld 107 (*)    GFR calc non Af Amer 87 (*)    All other components within normal limits  CBC  PRO  B NATRIURETIC PEPTIDE  POC OCCULT BLOOD, ED  I-STAT TROPOININ, ED  TYPE AND SCREEN    Imaging Review No results found.   EKG Interpretation   Date/Time:  Thursday September 15 2014 17:26:04 EDT Ventricular Rate:  91 PR Interval:  126 QRS Duration: 165 QT Interval:  407 QTC Calculation: 501 R Axis:   29 Text Interpretation:  Sinus rhythm Premature ventricular complexes Right  bundle branch block Confirmed by KOHUT  MD, STEPHEN ([id number]) on 09/15/2014  5:34:04 PM     10:00 PM Reassessed patient.  She reports symptoms have improved at this time.  Abdomen soft with very mild generalized tenderness to palpation. MDM   Final diagnoses:  None   Patient with a history of Asthma, CHF, [name], and PE presents today with a chief complaint of SOB.  Lungs clear on exam.  Patient is currently not on anticoagulant.  Labs unremarkable.  No ischemic changes on EKG.  Initial and 3 hour troponin negative.  CXR unremarkable.  CT angio chest is negative for PE.  Patient is also complaining of nausea and vomiting.  No vomiting during ED course.  Nausea improved after given Zofran.  Patient tolerating PO liquids.  Patient is also complaining of black stool.  Hemoccult positive, but no gross blood in stool.  Hemoglobin is stable.  Feel that the patient is stable for discharge.  Return precautions given.    Hyman Bible, PA-C 09/22/14 1431

## 2014-09-15 NOTE — Progress Notes (Signed)
09/15/2014 A. Tationna Fullard RNCM 2204pm  EDCM spoke to [place] who confirms patient is to be discharged.  EDP Dr. Wilson Singer has placed home health orders with face to face.  EDCM asked EDPA to give her phone to patient so that [facility] can speak with her.  EDCM informed patient that [place] home health services will be coming to see her for a visiting RN, PT and an aide.  Patient asked, "At [facility]?  [facility] informed patient that they will see her at [facility].  Patient agreeable to services.  [facility] faxed orders for home health services to [place] at 2159pm with confirmation of receipt at 2201pm.  No further EDCM needs at this time.

## 2014-09-15 NOTE — ED Notes (Signed)
Pt's daughter in law brought her in, her husband is POA and is supposed to be coming shortly. Both of them do not want the pt to have any narcotics because she has an addiction problem. Pt normally uses a pain patch at home and takes medication for anxiety and are ok with her having those.

## 2014-09-15 NOTE — Telephone Encounter (Signed)
Refill done for alprazolam 1 mg take 1/2 to 1 tab po tid prn for anxiety, faxed into pharmacy

## 2014-09-15 NOTE — ED Notes (Signed)
Patient transported to X-ray 

## 2014-09-15 NOTE — ED Notes (Signed)
Pt sts she is here for a relapse of symptoms that started in July. Pt c/o sob, feeling lightheaded, slight abd pain, nausea and dark tarry stools. Denies use of blood thinners, sts she was on them the last time she had a GI bleed. Pt wears 3 liters/min oxygen at home. Nad, skin warm and dry, resp e/u.

## 2014-09-15 NOTE — ED Notes (Signed)
Josh, EMT checked her oxygen levels on room air and they were 96%.

## 2014-09-15 NOTE — Progress Notes (Signed)
  CARE MANAGEMENT ED NOTE 09/15/2014  Patient:  Linda [name]   Account Number:  [id number]  Date Initiated:  09/15/2014  Documentation initiated by:  Livia Snellen  Subjective/Objective Assessment:   Patient presents to Ed with shortness of breath, lightheaded, slight abdominal pain, dark tarry stools.     Subjective/Objective Assessment Detail:   Patient with history of hypertension, depression, syncope, diverticulitis     Action/Plan:   Action/Plan Detail:   Anticipated DC Date:       Status Recommendation to Physician:   Result of Recommendation:    Other ED Services  Consult Working [place]  CM consult  Other    Choice offered to / List presented to:           [place]    Status of service:  Completed, signed off  ED Comments:   ED Comments Detail:  [facility] received phone call  from Kirke Corin of [place] regarding patient.  Debbie reports patient is [name] Psychologist, clinical patient of [place].  Debbie of [facility] is requesting new home health orders to be placed in particular RN and PT.  Please contact case management for home health orders if patient is to be discharged.

## 2014-09-15 NOTE — ED Notes (Signed)
Family at bedside. 

## 2014-09-15 NOTE — ED Notes (Signed)
Patient is alert and orientedx4.  Patient was explained discharge instructions and they understood them with no questions.  The patient's daughter in law, Aneli Zara is taking the patient home.

## 2014-09-15 NOTE — ED Notes (Signed)
C/o increased nausea, Heather EDPA made aware.

## 2014-09-15 NOTE — ED Notes (Signed)
MD at bedside. 

## 2014-09-15 NOTE — Progress Notes (Signed)
09/15/2014 A. Mable Dara RNCM 1712pm EDCM spoke to EDPA regarding home health services with face to face order if patient is to be discharged.  EDCM will follow up.

## 2014-09-15 NOTE — ED Notes (Signed)
The patient complained of chest pain that radiates to her back, and her lips are numb.  I advised Heather, PA and she advised repeat EKG.

## 2014-09-17 DIAGNOSIS — I5030 Unspecified diastolic (congestive) heart failure: Secondary | ICD-10-CM | POA: Diagnosis not present

## 2014-09-17 DIAGNOSIS — D509 Iron deficiency anemia, unspecified: Secondary | ICD-10-CM | POA: Diagnosis not present

## 2014-09-17 DIAGNOSIS — F329 Major depressive disorder, single episode, unspecified: Secondary | ICD-10-CM | POA: Diagnosis not present

## 2014-09-17 DIAGNOSIS — M1990 Unspecified osteoarthritis, unspecified site: Secondary | ICD-10-CM | POA: Diagnosis not present

## 2014-09-17 DIAGNOSIS — M797 Fibromyalgia: Secondary | ICD-10-CM | POA: Diagnosis not present

## 2014-09-17 DIAGNOSIS — J449 Chronic obstructive pulmonary disease, unspecified: Secondary | ICD-10-CM | POA: Diagnosis not present

## 2014-09-19 ENCOUNTER — Telehealth: Payer: Self-pay

## 2014-09-19 DIAGNOSIS — M797 Fibromyalgia: Secondary | ICD-10-CM | POA: Diagnosis not present

## 2014-09-19 DIAGNOSIS — M1710 Unilateral primary osteoarthritis, unspecified knee: Secondary | ICD-10-CM | POA: Diagnosis not present

## 2014-09-19 DIAGNOSIS — F329 Major depressive disorder, single episode, unspecified: Secondary | ICD-10-CM | POA: Diagnosis not present

## 2014-09-19 DIAGNOSIS — I5030 Unspecified diastolic (congestive) heart failure: Secondary | ICD-10-CM | POA: Diagnosis not present

## 2014-09-19 DIAGNOSIS — M1990 Unspecified osteoarthritis, unspecified site: Secondary | ICD-10-CM | POA: Diagnosis not present

## 2014-09-19 DIAGNOSIS — IMO0002 Reserved for concepts with insufficient information to code with codable children: Secondary | ICD-10-CM | POA: Diagnosis not present

## 2014-09-19 DIAGNOSIS — D509 Iron deficiency anemia, unspecified: Secondary | ICD-10-CM | POA: Diagnosis not present

## 2014-09-19 DIAGNOSIS — J449 Chronic obstructive pulmonary disease, unspecified: Secondary | ICD-10-CM | POA: Diagnosis not present

## 2014-09-19 NOTE — Telephone Encounter (Signed)
OK for verbal order as per the nurse's requests today.-thx

## 2014-09-19 NOTE — Telephone Encounter (Signed)
Haig Prophet RN w/ [facility] called requesting and order for a medical social worker for the pt.  Anderson Malta states that the pt's children have been withholding her medication from her and the pt's pain level is around a 7 or 8. Anderson Malta requested an order for skilled nursing visit once a week x 8 weeks as well as an order for a pulse ox since the pt is on oxygen.  Please advise

## 2014-09-21 DIAGNOSIS — I5030 Unspecified diastolic (congestive) heart failure: Secondary | ICD-10-CM | POA: Diagnosis not present

## 2014-09-21 DIAGNOSIS — M1990 Unspecified osteoarthritis, unspecified site: Secondary | ICD-10-CM | POA: Diagnosis not present

## 2014-09-21 DIAGNOSIS — D509 Iron deficiency anemia, unspecified: Secondary | ICD-10-CM | POA: Diagnosis not present

## 2014-09-21 DIAGNOSIS — F329 Major depressive disorder, single episode, unspecified: Secondary | ICD-10-CM | POA: Diagnosis not present

## 2014-09-21 DIAGNOSIS — J449 Chronic obstructive pulmonary disease, unspecified: Secondary | ICD-10-CM | POA: Diagnosis not present

## 2014-09-21 DIAGNOSIS — M797 Fibromyalgia: Secondary | ICD-10-CM | POA: Diagnosis not present

## 2014-09-21 NOTE — Telephone Encounter (Signed)
LMOM for PT to give verbal orders.

## 2014-09-22 DIAGNOSIS — M1990 Unspecified osteoarthritis, unspecified site: Secondary | ICD-10-CM | POA: Diagnosis not present

## 2014-09-22 DIAGNOSIS — J449 Chronic obstructive pulmonary disease, unspecified: Secondary | ICD-10-CM | POA: Diagnosis not present

## 2014-09-22 DIAGNOSIS — D509 Iron deficiency anemia, unspecified: Secondary | ICD-10-CM | POA: Diagnosis not present

## 2014-09-22 DIAGNOSIS — M797 Fibromyalgia: Secondary | ICD-10-CM | POA: Diagnosis not present

## 2014-09-22 DIAGNOSIS — I5030 Unspecified diastolic (congestive) heart failure: Secondary | ICD-10-CM | POA: Diagnosis not present

## 2014-09-22 DIAGNOSIS — F329 Major depressive disorder, single episode, unspecified: Secondary | ICD-10-CM | POA: Diagnosis not present

## 2014-09-27 ENCOUNTER — Encounter: Payer: Medicare Other | Attending: Physical Medicine & Rehabilitation | Admitting: Physical Medicine & Rehabilitation

## 2014-09-27 DIAGNOSIS — F419 Anxiety disorder, unspecified: Secondary | ICD-10-CM | POA: Insufficient documentation

## 2014-09-27 DIAGNOSIS — M170 Bilateral primary osteoarthritis of knee: Secondary | ICD-10-CM | POA: Insufficient documentation

## 2014-09-27 DIAGNOSIS — J61 Pneumoconiosis due to asbestos and other mineral fibers: Secondary | ICD-10-CM | POA: Insufficient documentation

## 2014-09-27 DIAGNOSIS — J45909 Unspecified asthma, uncomplicated: Secondary | ICD-10-CM | POA: Insufficient documentation

## 2014-09-27 DIAGNOSIS — F329 Major depressive disorder, single episode, unspecified: Secondary | ICD-10-CM | POA: Insufficient documentation

## 2014-09-27 DIAGNOSIS — R269 Unspecified abnormalities of gait and mobility: Secondary | ICD-10-CM | POA: Insufficient documentation

## 2014-09-27 DIAGNOSIS — G8929 Other chronic pain: Secondary | ICD-10-CM | POA: Insufficient documentation

## 2014-09-27 DIAGNOSIS — M25561 Pain in right knee: Secondary | ICD-10-CM | POA: Insufficient documentation

## 2014-09-27 DIAGNOSIS — M797 Fibromyalgia: Secondary | ICD-10-CM | POA: Insufficient documentation

## 2014-09-27 DIAGNOSIS — M25562 Pain in left knee: Secondary | ICD-10-CM | POA: Insufficient documentation

## 2014-09-27 DIAGNOSIS — M545 Low back pain: Secondary | ICD-10-CM | POA: Insufficient documentation

## 2014-09-28 DIAGNOSIS — M1990 Unspecified osteoarthritis, unspecified site: Secondary | ICD-10-CM | POA: Diagnosis not present

## 2014-09-28 DIAGNOSIS — I5030 Unspecified diastolic (congestive) heart failure: Secondary | ICD-10-CM | POA: Diagnosis not present

## 2014-09-28 DIAGNOSIS — F329 Major depressive disorder, single episode, unspecified: Secondary | ICD-10-CM | POA: Diagnosis not present

## 2014-09-28 DIAGNOSIS — M797 Fibromyalgia: Secondary | ICD-10-CM | POA: Diagnosis not present

## 2014-09-28 DIAGNOSIS — D509 Iron deficiency anemia, unspecified: Secondary | ICD-10-CM | POA: Diagnosis not present

## 2014-09-28 DIAGNOSIS — J449 Chronic obstructive pulmonary disease, unspecified: Secondary | ICD-10-CM | POA: Diagnosis not present

## 2014-09-29 DIAGNOSIS — D509 Iron deficiency anemia, unspecified: Secondary | ICD-10-CM | POA: Diagnosis not present

## 2014-09-29 DIAGNOSIS — I5030 Unspecified diastolic (congestive) heart failure: Secondary | ICD-10-CM | POA: Diagnosis not present

## 2014-09-29 DIAGNOSIS — M797 Fibromyalgia: Secondary | ICD-10-CM | POA: Diagnosis not present

## 2014-09-29 DIAGNOSIS — J449 Chronic obstructive pulmonary disease, unspecified: Secondary | ICD-10-CM | POA: Diagnosis not present

## 2014-09-29 DIAGNOSIS — F329 Major depressive disorder, single episode, unspecified: Secondary | ICD-10-CM | POA: Diagnosis not present

## 2014-09-29 DIAGNOSIS — M1990 Unspecified osteoarthritis, unspecified site: Secondary | ICD-10-CM | POA: Diagnosis not present

## 2014-09-29 NOTE — ED Provider Notes (Signed)
Medical screening examination/treatment/procedure(s) were performed by non-physician practitioner and as supervising physician I was immediately available for consultation/collaboration.   EKG Interpretation   Date/Time:  Thursday September 15 2014 17:26:04 EDT Ventricular Rate:  91 PR Interval:  126 QRS Duration: 165 QT Interval:  407 QTC Calculation: 501 R Axis:   29 Text Interpretation:  Sinus rhythm Premature ventricular complexes Right  bundle branch block Confirmed by Wilson Singer  MD, Brithany Whitworth ([id number]) on 09/15/2014  5:34:04 PM       Virgel Manifold, MD 09/29/14 2251

## 2014-09-30 ENCOUNTER — Telehealth: Payer: Self-pay | Admitting: Family Medicine

## 2014-09-30 DIAGNOSIS — D509 Iron deficiency anemia, unspecified: Secondary | ICD-10-CM | POA: Diagnosis not present

## 2014-09-30 DIAGNOSIS — M1990 Unspecified osteoarthritis, unspecified site: Secondary | ICD-10-CM | POA: Diagnosis not present

## 2014-09-30 DIAGNOSIS — J449 Chronic obstructive pulmonary disease, unspecified: Secondary | ICD-10-CM | POA: Diagnosis not present

## 2014-09-30 DIAGNOSIS — M797 Fibromyalgia: Secondary | ICD-10-CM | POA: Diagnosis not present

## 2014-09-30 DIAGNOSIS — F329 Major depressive disorder, single episode, unspecified: Secondary | ICD-10-CM | POA: Diagnosis not present

## 2014-09-30 DIAGNOSIS — I5030 Unspecified diastolic (congestive) heart failure: Secondary | ICD-10-CM | POA: Diagnosis not present

## 2014-09-30 NOTE — Telephone Encounter (Signed)
Hom health nurse [facility] wanting to get a copy of patient's current med list faxed to [phone] to verify that she is taking all medications correctly.  Can you please go over her med list to make sure it's correct before I fax.

## 2014-10-03 DIAGNOSIS — F329 Major depressive disorder, single episode, unspecified: Secondary | ICD-10-CM | POA: Diagnosis not present

## 2014-10-03 DIAGNOSIS — M1990 Unspecified osteoarthritis, unspecified site: Secondary | ICD-10-CM | POA: Diagnosis not present

## 2014-10-03 DIAGNOSIS — J449 Chronic obstructive pulmonary disease, unspecified: Secondary | ICD-10-CM | POA: Diagnosis not present

## 2014-10-03 DIAGNOSIS — D509 Iron deficiency anemia, unspecified: Secondary | ICD-10-CM | POA: Diagnosis not present

## 2014-10-03 DIAGNOSIS — M797 Fibromyalgia: Secondary | ICD-10-CM | POA: Diagnosis not present

## 2014-10-03 DIAGNOSIS — I5030 Unspecified diastolic (congestive) heart failure: Secondary | ICD-10-CM | POA: Diagnosis not present

## 2014-10-04 DIAGNOSIS — D509 Iron deficiency anemia, unspecified: Secondary | ICD-10-CM | POA: Diagnosis not present

## 2014-10-04 DIAGNOSIS — I5030 Unspecified diastolic (congestive) heart failure: Secondary | ICD-10-CM | POA: Diagnosis not present

## 2014-10-04 DIAGNOSIS — F329 Major depressive disorder, single episode, unspecified: Secondary | ICD-10-CM | POA: Diagnosis not present

## 2014-10-04 DIAGNOSIS — J449 Chronic obstructive pulmonary disease, unspecified: Secondary | ICD-10-CM | POA: Diagnosis not present

## 2014-10-04 DIAGNOSIS — M1990 Unspecified osteoarthritis, unspecified site: Secondary | ICD-10-CM | POA: Diagnosis not present

## 2014-10-04 DIAGNOSIS — M797 Fibromyalgia: Secondary | ICD-10-CM | POA: Diagnosis not present

## 2014-10-05 ENCOUNTER — Ambulatory Visit (INDEPENDENT_AMBULATORY_CARE_PROVIDER_SITE_OTHER): Payer: Medicare Other | Admitting: Family Medicine

## 2014-10-05 ENCOUNTER — Ambulatory Visit: Payer: Medicare Other | Admitting: Family Medicine

## 2014-10-05 ENCOUNTER — Encounter: Payer: Self-pay | Admitting: Family Medicine

## 2014-10-05 ENCOUNTER — Ambulatory Visit (HOSPITAL_BASED_OUTPATIENT_CLINIC_OR_DEPARTMENT_OTHER)
Admission: RE | Admit: 2014-10-05 | Discharge: 2014-10-05 | Disposition: A | Discharge status: Home / Self Care | Payer: Medicare Other | Source: Ambulatory Visit | Attending: Family Medicine | Admitting: Family Medicine

## 2014-10-05 VITALS — BP 161/81 | HR 78 | Temp 98.4°F | Resp 18 | Ht 63.0 in | Wt 177.0 lb

## 2014-10-05 DIAGNOSIS — J4540 Moderate persistent asthma, uncomplicated: Secondary | ICD-10-CM

## 2014-10-05 DIAGNOSIS — I509 Heart failure, unspecified: Secondary | ICD-10-CM | POA: Diagnosis not present

## 2014-10-05 DIAGNOSIS — Z23 Encounter for immunization: Secondary | ICD-10-CM

## 2014-10-05 DIAGNOSIS — I10 Essential (primary) hypertension: Secondary | ICD-10-CM | POA: Diagnosis not present

## 2014-10-05 DIAGNOSIS — G939 Disorder of brain, unspecified: Secondary | ICD-10-CM

## 2014-10-05 DIAGNOSIS — J9611 Chronic respiratory failure with hypoxia: Secondary | ICD-10-CM | POA: Diagnosis not present

## 2014-10-05 DIAGNOSIS — K922 Gastrointestinal hemorrhage, unspecified: Secondary | ICD-10-CM

## 2014-10-05 DIAGNOSIS — Z86711 Personal history of pulmonary embolism: Secondary | ICD-10-CM

## 2014-10-05 DIAGNOSIS — R2689 Other abnormalities of gait and mobility: Secondary | ICD-10-CM

## 2014-10-05 DIAGNOSIS — J449 Chronic obstructive pulmonary disease, unspecified: Secondary | ICD-10-CM | POA: Diagnosis not present

## 2014-10-05 DIAGNOSIS — M170 Bilateral primary osteoarthritis of knee: Secondary | ICD-10-CM

## 2014-10-05 DIAGNOSIS — R0602 Shortness of breath: Secondary | ICD-10-CM | POA: Diagnosis not present

## 2014-10-05 DIAGNOSIS — M791 Myalgia: Secondary | ICD-10-CM

## 2014-10-05 DIAGNOSIS — Z79899 Other long term (current) drug therapy: Secondary | ICD-10-CM | POA: Diagnosis not present

## 2014-10-05 DIAGNOSIS — I5030 Unspecified diastolic (congestive) heart failure: Secondary | ICD-10-CM | POA: Diagnosis not present

## 2014-10-05 DIAGNOSIS — M609 Myositis, unspecified: Secondary | ICD-10-CM

## 2014-10-05 DIAGNOSIS — M1990 Unspecified osteoarthritis, unspecified site: Secondary | ICD-10-CM | POA: Diagnosis not present

## 2014-10-05 DIAGNOSIS — G894 Chronic pain syndrome: Secondary | ICD-10-CM

## 2014-10-05 DIAGNOSIS — R079 Chest pain, unspecified: Secondary | ICD-10-CM | POA: Insufficient documentation

## 2014-10-05 DIAGNOSIS — G9389 Other specified disorders of brain: Secondary | ICD-10-CM

## 2014-10-05 DIAGNOSIS — M797 Fibromyalgia: Secondary | ICD-10-CM | POA: Diagnosis not present

## 2014-10-05 DIAGNOSIS — D509 Iron deficiency anemia, unspecified: Secondary | ICD-10-CM

## 2014-10-05 DIAGNOSIS — M5136 Other intervertebral disc degeneration, lumbar region: Secondary | ICD-10-CM

## 2014-10-05 LAB — CBC WITH DIFFERENTIAL/PLATELET
Basophils Absolute: 0 10*3/uL (ref 0.0–0.1)
Basophils Relative: 0.5 % (ref 0.0–3.0)
EOS ABS: 0.1 10*3/uL (ref 0.0–0.7)
Eosinophils Relative: 1 % (ref 0.0–5.0)
HCT: 36.2 % (ref 36.0–46.0)
Hemoglobin: 12.1 g/dL (ref 12.0–15.0)
Lymphocytes Relative: 24.1 % (ref 12.0–46.0)
Lymphs Abs: 1.4 10*3/uL (ref 0.7–4.0)
MCHC: 33.5 g/dL (ref 30.0–36.0)
MCV: 89.7 fl (ref 78.0–100.0)
MONOS PCT: 7.4 % (ref 3.0–12.0)
Monocytes Absolute: 0.4 10*3/uL (ref 0.1–1.0)
NEUTROS PCT: 67 % (ref 43.0–77.0)
Neutro Abs: 4 10*3/uL (ref 1.4–7.7)
Platelets: 169 10*3/uL (ref 150.0–400.0)
RBC: 4.04 Mil/uL (ref 3.87–5.11)
RDW: 13.6 % (ref 11.5–15.5)
WBC: 5.9 10*3/uL (ref 4.0–10.5)

## 2014-10-05 LAB — BASIC METABOLIC PANEL
BUN: 9 mg/dL (ref 6–23)
CHLORIDE: 105 meq/L (ref 96–112)
CO2: 27 meq/L (ref 19–32)
Calcium: 9.2 mg/dL (ref 8.4–10.5)
Creatinine, Ser: 0.7 mg/dL (ref 0.4–1.2)
GFR: 82.82 mL/min (ref >60.00)
Glucose, Bld: 126 mg/dL — ABNORMAL HIGH (ref 70–99)
POTASSIUM: 3.3 meq/L — AB (ref 3.5–5.1)
SODIUM: 139 meq/L (ref 135–145)

## 2014-10-05 MED ORDER — BUDESONIDE-FORMOTEROL FUMARATE 160-4.5 MCG/ACT IN AERO: 2.0000 | INHALATION_SPRAY | Freq: Two times a day (BID) | RESPIRATORY_TRACT | Status: DC

## 2014-10-05 MED ORDER — PREGABALIN 200 MG PO CAPS: ORAL_CAPSULE | ORAL | Status: DC

## 2014-10-05 MED ORDER — ALPRAZOLAM 1 MG PO TABS: 1.0000 mg | ORAL_TABLET | Freq: Three times a day (TID) | ORAL | Status: DC

## 2014-10-05 MED ORDER — PREGABALIN 100 MG PO CAPS: ORAL_CAPSULE | ORAL | Status: DC

## 2014-10-05 NOTE — Progress Notes (Signed)
Pre visit review using our clinic review tool, if applicable. No additional management support is needed unless otherwise documented below in the visit note. 

## 2014-10-05 NOTE — Progress Notes (Addendum)
OFFICE VISIT  10/05/2014   CC: f/u hosp and rehab/ALF  HPI:    Patient is a 68 y.o. Caucasian female who presents accompanied by her son today for f/u chronic illness/polypharmacy follow up.  I have not seen her in over 4 mo.  She was in hosp 7/26-8/4, 2015 in [place] for GI bleed on coumadin (which I had told her to discontinue PRIOR to her having the GI bleed, but she continued on the med for unknown reason.  She apparently was on plavix and meloxicam at the time of admission as well). She then went to rehab portion of hosp 07/26/14-08/08/14 and then was d/c'd for more rehab for multifactorial gait disorder to [facility].  She is now at [facility] as of 09/12/14.  I reviewed her med list in detail today and in recent weeks as I have received some correspondence from this ALF.  On oxygen 3L 24/7 since this most recent hospitalization for hx of "chronic hypoxic resp failure" secondary to asthma asbestosis per hosp d/c summary.  No signif cough lately and no fevers.  She was not d/c'd from hospital on any asthma controller med.  It was also noted in hosp that she should get a sleep study for suspicion of OSA. As far as outpatient pulmonary providers go, she relates a poor past interaction with Dr. Melvyn Novas at [facility], so she sought care with another pulmonologist on one occasion after this named Dr. Camillo Flaming in [facility].  She wishes to get pulmonology f/u with Dr. Camillo Flaming so we will se this up.  Says pain is still significant, located all over but the worst areas are low back and knees.  She ambulates with a walker.  She has been on fentanyl 25 mcg patch q72h since being in SNF and she was getting oxycodone 2-4 times a day while in [place], but since that time I have not rx'd any narcotic pills for breakthrough b/c family did not want this and I wholeheartedly agreed since pt has shown misuse of these meds in the past to the degree that I think it was impairing her health and causing more  harm than good.   Today her son is interested in restarting her on something, preferably non-narcotic.  I told him that there is nothing that will be helpful for her pain that is non-narcotic, so he asked if I could restart her on a low dose of something narcotic.  I declined, stating once again the greater risk vs benefit of this med for her in my opinion. However, I said I would refer her to a pain mgmt MD for further assistance with this problem, and pt agreed to this option, stating she preferred Dr. Algis Downs b/c she had seem him when she was in hospital this summer and she liked him.  PMH: HTN Asthma Hyperlipidemia Recurrent depression Chronic anxiety Grief reaction OSA on CPAP Hx of Chest pain: Nonobstructive CAD 2014 DDD: cervical and lumbar, with chronic LBP Osteoarthritis, knees esp. Grade I diastolic dysfunction Hx of PE: treated x 10 mo with coumadin (2014-2015) Hx of iron def anemia (NOT related to GI bleeding) Recent hx of acute blood loss anemia secondary to GI bleed on coumadin. Hx of pernicious anemia Hx of recurrent UTI, with hx of mixed urge and stress incontinence Fibromyalgia Insomnia  Past Surgical History  Procedure Laterality Date  . Appendectomy  1960  . Total abdominal hysterectomy  1974  . Tendon release  1996  Right forearm and hand  . Knee surgery  2005  . Heel spur surgery Left 2008  . Plantar fascia release Left 2008  . Axillary surgery Left 1978    Multiple "lump" in armpit per pt  . Coccyx removal  1972  . Cardiac catheterization  01/2013    nonobstructive CAD, EF 55-60%  . Transthoracic echocardiogram  01/2013; 04/2014    2014--NORMAL.  2015--focal basal septal hypertrophy, EF 55-60%, grade I diast dysfxn, mild LAE.    . Dilation and curettage of uterus  ? 1970's  . Eye surgery Left 2012-2013    "injections for ~ 1 yr; don't really know what for" (07/12/2013)  . Spirometry  04/25/14    In hosp for acute asthma/COPD flare: mixed obstructive  and restrictive lung disease. The FEV1 is severely reduced at 45% predicted.  FEV1 signif decreased compared to prior spirometry 07/23/13.  [name] Kitchen Esophagogastroduodenoscopy N/A 07/19/2014    Procedure: ESOPHAGOGASTRODUODENOSCOPY ;  Surgeon: Ladene Artist, MD;  Location: [facility] ENDOSCOPY;  Service: Endoscopy;  Laterality: N/A;    Outpatient Prescriptions Prior to Visit  Medication Sig Dispense Refill  . atorvastatin (LIPITOR) 20 MG tablet Take 1 tablet (20 mg total) by mouth daily at 6 PM.  30 tablet  6  . Cholecalciferol (VITAMIN D3) 50000 UNITS CAPS Take 50,000 Units by mouth once a week.  10 capsule  0  . DULoxetine (CYMBALTA) 60 MG capsule Take 1 capsule (60 mg total) by mouth daily.  30 capsule  6  . fentaNYL (DURAGESIC - DOSED MCG/HR) 25 MCG/HR patch Place 1 patch (25 mcg total) onto the skin every 3 (three) days.  10 patch  0  . isosorbide mononitrate (IMDUR) 30 MG 24 hr tablet Take 1 tablet (30 mg total) by mouth daily.  30 tablet  6  . nebivolol (BYSTOLIC) 5 MG tablet Take 1 tablet (5 mg total) by mouth daily.  30 tablet  6  . pantoprazole (PROTONIX) 40 MG tablet Take 1 tablet (40 mg total) by mouth daily.  30 tablet  6  . polyethylene glycol powder (GLYCOLAX/MIRALAX) powder Take 17 g by mouth daily.  3350 g  6  . traZODone (DESYREL) 50 MG tablet Take 3 tablets (150 mg total) by mouth at bedtime.  90 tablet  6  . ALPRAZolam (XANAX) 1 MG tablet Take 1 tablet (1 mg total) by mouth 3 (three) times daily.  90 tablet  0  . ondansetron (ZOFRAN) 4 MG tablet Take 1 tablet (4 mg total) by mouth 2 (two) times daily as needed for nausea or vomiting.  120 tablet  5  . pregabalin (LYRICA) 100 MG capsule Take 1 capsule (100 mg total) by mouth 2 (two) times daily.  30 capsule  5  . pregabalin (LYRICA) 200 MG capsule Take one capsule in the morning and 2 capsule at bedtime  30 capsule  5  . albuterol (PROVENTIL HFA;VENTOLIN HFA) 108 (90 BASE) MCG/ACT inhaler Inhale 2 puffs into the lungs every 6 (six) hours as  needed for wheezing or shortness of breath.  1 Inhaler  1  . albuterol (PROVENTIL) (2.5 MG/3ML) 0.083% nebulizer solution Take 3 mLs (2.5 mg total) by nebulization every 6 (six) hours as needed for wheezing or shortness of breath.  75 mL  1  . diclofenac sodium (VOLTAREN) 1 % GEL Apply 4 g topically 4 (four) times daily.  809 g  6  . folic acid (FOLVITE) 1 MG tablet Take 2 tablets (2 mg total) by mouth daily.  60 tablet  6  . hydrocerin (EUCERIN) CREA Apply 1 application topically 2 (two) times daily. To dry flaky areas    0  . ondansetron (ZOFRAN) 4 MG tablet Take 1 tablet (4 mg total) by mouth every 6 (six) hours.  12 tablet  0  . nitroGLYCERIN (NITROSTAT) 0.4 MG SL tablet Place 1 tablet (0.4 mg total) under the tongue every 5 (five) minutes x 3 doses as needed for chest pain.  30 tablet  1  . Oxycodone HCl 10 MG TABS Take 10 mg by mouth every 6 (six) hours.      . traMADol (ULTRAM) 50 MG tablet Take 1 tablet (50 mg total) by mouth every 6 (six) hours as needed for moderate pain.  30 tablet  0   Facility-Administered Medications Prior to Visit  Medication Dose Route Frequency Provider Last Rate Last Dose  . ferumoxytol (FERAHEME) 510 mg in sodium chloride 0.9 % 100 mL IVPB  510 mg Intravenous Once Volanda Napoleon, MD        Allergies  Allergen Reactions  . Oxycodone     Son asked [place] not to prescribe narcotics for patient due to patient dependence.  See 08/26/14 note  . Penicillins Itching, Swelling and Rash    Tolerated Cefepime in ED.    ROS As per HPI  PE: Blood pressure 161/81, pulse 78, temperature 98.4 F (36.9 C), temperature source Temporal, resp. rate 18, height 5\' 3"  (1.6 m), weight 177 lb (80.287 kg), SpO2 98.00%. 3 L oxygen.  After 5 min off oxygen her oxygen sat was 92-93% while sitting. Gen: Alert, well appearing.  Patient is oriented to person, place, time, and situation. Pleasant affect, lucid thought and speech. CV: RRR, no m/r/g LUNGS: CTA except a trace of early insp  crackles in left post base, with more prominent similar sounds on right base but diminished aeration in right base compared to left.  Nonlabored resps. During the time the oxygen was turned off she had no color change and no increased work of breathing. EXT: no clubbing, cyanosis, or edema.   LABS:  Lab Results  Component Value Date   WBC 8.3 09/15/2014   HGB 12.8 09/15/2014   HCT 37.5 09/15/2014   MCV 87.8 09/15/2014   PLT 206 09/15/2014     Chemistry      Component Value Date/Time   NA 140 09/15/2014 1454   NA 146* 08/24/2014 1124   K 3.1* 09/15/2014 1454   K 3.5 08/24/2014 1124   CL 100 09/15/2014 1454   CL 101 08/24/2014 1124   CO2 26 09/15/2014 1454   CO2 30 08/24/2014 1124   BUN 16 09/15/2014 1454   BUN 12 08/24/2014 1124   CREATININE 0.69 09/15/2014 1454   CREATININE 0.7 08/24/2014 1124      Component Value Date/Time   CALCIUM 9.5 09/15/2014 1454   CALCIUM 9.2 08/24/2014 1124   ALKPHOS 64 08/24/2014 1124   ALKPHOS 68 07/27/2014 0540   AST 22 08/24/2014 1124   AST 20 07/27/2014 0540   ALT 21 08/24/2014 1124   ALT 16 07/27/2014 0540   BILITOT 0.60 08/24/2014 1124   BILITOT 0.3 07/27/2014 0540       IMPRESSION AND PLAN:  1) Pulmonary issues:   Asthma, with hx of pleural plaques from asbestos exposure: pt not on controller med at this time so I will start symbicort 160/4.5, 2 puffs bid. She may be able to go off of her oxygen but will leave this  decision to her pulmonologist at this time: she has appt with Dr. Camillo Flaming tomorrow to go over chronic pulmonary issues.   Will check CXR today to f/u abnormal lung sounds heard on auscultation today.  2) Chronic pain syndrome: she has osteoarthritis in back and knees, also fibromyalgia.  I will not manage her pain but will refer her to Dr. Naaman Plummer for pain mgmt.  Will continue with fentanyl 25 mcg patch q72h but no narcotic pills for breakthrough pain treatment.  3) Anemia associated with acute blood loss from GI bleed:  Off of anticoagulant now, nor is she taking  any ASA, plavix, or NSAID. She has been on bid iron tab. Will recheck CBC today--she may be able to come off the iron.  4) Major depression, with recent prolonged grieving due to the death of her husband.  Chronic anxiety. Continue duloxetine and xanax.  Xanax rx RF handed to pt/pt's son today.  5) HTN; fairly stable.  I am not going to make any med changes for this today. BMET today.  An After Visit Summary was printed and given to the patient.  FOLLOW UP: Return in about 4 weeks (around 11/02/2014) for routine chronic illness f/u.

## 2014-10-06 ENCOUNTER — Other Ambulatory Visit: Payer: Self-pay | Admitting: Nurse Practitioner

## 2014-10-06 ENCOUNTER — Other Ambulatory Visit: Payer: Self-pay | Admitting: Family Medicine

## 2014-10-06 ENCOUNTER — Telehealth: Payer: Self-pay | Admitting: *Deleted

## 2014-10-06 DIAGNOSIS — I5030 Unspecified diastolic (congestive) heart failure: Secondary | ICD-10-CM | POA: Diagnosis not present

## 2014-10-06 DIAGNOSIS — J441 Chronic obstructive pulmonary disease with (acute) exacerbation: Secondary | ICD-10-CM

## 2014-10-06 DIAGNOSIS — F329 Major depressive disorder, single episode, unspecified: Secondary | ICD-10-CM | POA: Diagnosis not present

## 2014-10-06 DIAGNOSIS — R0902 Hypoxemia: Secondary | ICD-10-CM | POA: Diagnosis not present

## 2014-10-06 DIAGNOSIS — J45901 Unspecified asthma with (acute) exacerbation: Principal | ICD-10-CM

## 2014-10-06 DIAGNOSIS — G4733 Obstructive sleep apnea (adult) (pediatric): Secondary | ICD-10-CM | POA: Diagnosis not present

## 2014-10-06 DIAGNOSIS — J61 Pneumoconiosis due to asbestos and other mineral fibers: Secondary | ICD-10-CM | POA: Diagnosis not present

## 2014-10-06 DIAGNOSIS — D509 Iron deficiency anemia, unspecified: Secondary | ICD-10-CM | POA: Diagnosis not present

## 2014-10-06 DIAGNOSIS — M797 Fibromyalgia: Secondary | ICD-10-CM | POA: Diagnosis not present

## 2014-10-06 DIAGNOSIS — J45909 Unspecified asthma, uncomplicated: Secondary | ICD-10-CM | POA: Diagnosis not present

## 2014-10-06 DIAGNOSIS — J449 Chronic obstructive pulmonary disease, unspecified: Secondary | ICD-10-CM | POA: Diagnosis not present

## 2014-10-06 DIAGNOSIS — J984 Other disorders of lung: Secondary | ICD-10-CM | POA: Diagnosis not present

## 2014-10-06 DIAGNOSIS — M1990 Unspecified osteoarthritis, unspecified site: Secondary | ICD-10-CM | POA: Diagnosis not present

## 2014-10-06 MED ORDER — BUDESONIDE-FORMOTEROL FUMARATE 160-4.5 MCG/ACT IN AERO: 2.0000 | INHALATION_SPRAY | Freq: Two times a day (BID) | RESPIRATORY_TRACT | Status: DC

## 2014-10-06 MED ORDER — POTASSIUM CHLORIDE CRYS ER 20 MEQ PO TBCR: 40.0000 meq | EXTENDED_RELEASE_TABLET | Freq: Once | ORAL | Status: DC

## 2014-10-06 NOTE — Telephone Encounter (Signed)
Patient left vm requesting rx for symbicort be sent to pharmacy. Looked in pt's ov note from 10/05/14 and Mr. G was going to give pt symbicort 160/4.5, 2 puffs bid. Please advise?

## 2014-10-07 DIAGNOSIS — M797 Fibromyalgia: Secondary | ICD-10-CM | POA: Diagnosis not present

## 2014-10-07 DIAGNOSIS — D509 Iron deficiency anemia, unspecified: Secondary | ICD-10-CM | POA: Diagnosis not present

## 2014-10-07 DIAGNOSIS — I5030 Unspecified diastolic (congestive) heart failure: Secondary | ICD-10-CM | POA: Diagnosis not present

## 2014-10-07 DIAGNOSIS — M1990 Unspecified osteoarthritis, unspecified site: Secondary | ICD-10-CM | POA: Diagnosis not present

## 2014-10-07 DIAGNOSIS — J449 Chronic obstructive pulmonary disease, unspecified: Secondary | ICD-10-CM | POA: Diagnosis not present

## 2014-10-07 DIAGNOSIS — F329 Major depressive disorder, single episode, unspecified: Secondary | ICD-10-CM | POA: Diagnosis not present

## 2014-10-10 DIAGNOSIS — F329 Major depressive disorder, single episode, unspecified: Secondary | ICD-10-CM | POA: Diagnosis not present

## 2014-10-10 DIAGNOSIS — M1990 Unspecified osteoarthritis, unspecified site: Secondary | ICD-10-CM | POA: Diagnosis not present

## 2014-10-10 DIAGNOSIS — I5030 Unspecified diastolic (congestive) heart failure: Secondary | ICD-10-CM | POA: Diagnosis not present

## 2014-10-10 DIAGNOSIS — J449 Chronic obstructive pulmonary disease, unspecified: Secondary | ICD-10-CM | POA: Diagnosis not present

## 2014-10-10 DIAGNOSIS — M797 Fibromyalgia: Secondary | ICD-10-CM | POA: Diagnosis not present

## 2014-10-10 DIAGNOSIS — D509 Iron deficiency anemia, unspecified: Secondary | ICD-10-CM | POA: Diagnosis not present

## 2014-10-11 NOTE — Telephone Encounter (Signed)
Complete

## 2014-10-12 ENCOUNTER — Other Ambulatory Visit: Payer: Self-pay | Admitting: *Deleted

## 2014-10-12 DIAGNOSIS — J449 Chronic obstructive pulmonary disease, unspecified: Secondary | ICD-10-CM | POA: Diagnosis not present

## 2014-10-12 DIAGNOSIS — F329 Major depressive disorder, single episode, unspecified: Secondary | ICD-10-CM | POA: Diagnosis not present

## 2014-10-12 DIAGNOSIS — M1990 Unspecified osteoarthritis, unspecified site: Secondary | ICD-10-CM | POA: Diagnosis not present

## 2014-10-12 DIAGNOSIS — M797 Fibromyalgia: Secondary | ICD-10-CM | POA: Diagnosis not present

## 2014-10-12 DIAGNOSIS — I5030 Unspecified diastolic (congestive) heart failure: Secondary | ICD-10-CM | POA: Diagnosis not present

## 2014-10-12 DIAGNOSIS — D509 Iron deficiency anemia, unspecified: Secondary | ICD-10-CM | POA: Diagnosis not present

## 2014-10-12 NOTE — Telephone Encounter (Signed)
Please advise 

## 2014-10-13 DIAGNOSIS — J449 Chronic obstructive pulmonary disease, unspecified: Secondary | ICD-10-CM | POA: Diagnosis not present

## 2014-10-13 DIAGNOSIS — D509 Iron deficiency anemia, unspecified: Secondary | ICD-10-CM | POA: Diagnosis not present

## 2014-10-13 DIAGNOSIS — M1990 Unspecified osteoarthritis, unspecified site: Secondary | ICD-10-CM | POA: Diagnosis not present

## 2014-10-13 DIAGNOSIS — F329 Major depressive disorder, single episode, unspecified: Secondary | ICD-10-CM | POA: Diagnosis not present

## 2014-10-13 DIAGNOSIS — I5030 Unspecified diastolic (congestive) heart failure: Secondary | ICD-10-CM | POA: Diagnosis not present

## 2014-10-13 DIAGNOSIS — M797 Fibromyalgia: Secondary | ICD-10-CM | POA: Diagnosis not present

## 2014-10-13 MED ORDER — FENTANYL 25 MCG/HR TD PT72: 25.0000 ug | MEDICATED_PATCH | TRANSDERMAL | Status: DC

## 2014-10-13 NOTE — Telephone Encounter (Signed)
Fentanyl patch rx printed.

## 2014-10-14 ENCOUNTER — Telehealth: Payer: Self-pay | Admitting: Family Medicine

## 2014-10-14 DIAGNOSIS — L570 Actinic keratosis: Secondary | ICD-10-CM | POA: Diagnosis not present

## 2014-10-14 DIAGNOSIS — L820 Inflamed seborrheic keratosis: Secondary | ICD-10-CM | POA: Diagnosis not present

## 2014-10-14 DIAGNOSIS — L578 Other skin changes due to chronic exposure to nonionizing radiation: Secondary | ICD-10-CM | POA: Diagnosis not present

## 2014-10-14 NOTE — Telephone Encounter (Signed)
Disregard previous phone message. I have called son and he has picked up RX.Linda [name]

## 2014-10-14 NOTE — Telephone Encounter (Signed)
[facility] called in reference to phentenol patch for [employer]. They need direction. Please call [phone].

## 2014-10-17 DIAGNOSIS — J449 Chronic obstructive pulmonary disease, unspecified: Secondary | ICD-10-CM | POA: Diagnosis not present

## 2014-10-17 DIAGNOSIS — D509 Iron deficiency anemia, unspecified: Secondary | ICD-10-CM | POA: Diagnosis not present

## 2014-10-17 DIAGNOSIS — M797 Fibromyalgia: Secondary | ICD-10-CM | POA: Diagnosis not present

## 2014-10-17 DIAGNOSIS — M1990 Unspecified osteoarthritis, unspecified site: Secondary | ICD-10-CM | POA: Diagnosis not present

## 2014-10-17 DIAGNOSIS — I5030 Unspecified diastolic (congestive) heart failure: Secondary | ICD-10-CM | POA: Diagnosis not present

## 2014-10-17 DIAGNOSIS — F329 Major depressive disorder, single episode, unspecified: Secondary | ICD-10-CM | POA: Diagnosis not present

## 2014-10-18 ENCOUNTER — Encounter (HOSPITAL_BASED_OUTPATIENT_CLINIC_OR_DEPARTMENT_OTHER): Payer: Medicare Other | Admitting: Physical Medicine & Rehabilitation

## 2014-10-18 ENCOUNTER — Encounter: Payer: Self-pay | Admitting: Physical Medicine & Rehabilitation

## 2014-10-18 VITALS — BP 125/71 | HR 77 | Resp 14 | Ht 64.0 in | Wt 176.0 lb

## 2014-10-18 DIAGNOSIS — R2689 Other abnormalities of gait and mobility: Secondary | ICD-10-CM

## 2014-10-18 DIAGNOSIS — R269 Unspecified abnormalities of gait and mobility: Secondary | ICD-10-CM | POA: Diagnosis not present

## 2014-10-18 DIAGNOSIS — M797 Fibromyalgia: Secondary | ICD-10-CM | POA: Diagnosis not present

## 2014-10-18 DIAGNOSIS — M25561 Pain in right knee: Secondary | ICD-10-CM | POA: Diagnosis not present

## 2014-10-18 DIAGNOSIS — M1711 Unilateral primary osteoarthritis, right knee: Secondary | ICD-10-CM

## 2014-10-18 DIAGNOSIS — G8929 Other chronic pain: Secondary | ICD-10-CM | POA: Diagnosis not present

## 2014-10-18 DIAGNOSIS — M170 Bilateral primary osteoarthritis of knee: Secondary | ICD-10-CM | POA: Diagnosis not present

## 2014-10-18 DIAGNOSIS — M545 Low back pain: Secondary | ICD-10-CM | POA: Diagnosis not present

## 2014-10-18 DIAGNOSIS — J61 Pneumoconiosis due to asbestos and other mineral fibers: Secondary | ICD-10-CM | POA: Diagnosis not present

## 2014-10-18 DIAGNOSIS — M25562 Pain in left knee: Secondary | ICD-10-CM | POA: Diagnosis not present

## 2014-10-18 DIAGNOSIS — F329 Major depressive disorder, single episode, unspecified: Secondary | ICD-10-CM | POA: Diagnosis not present

## 2014-10-18 DIAGNOSIS — F419 Anxiety disorder, unspecified: Secondary | ICD-10-CM | POA: Diagnosis not present

## 2014-10-18 DIAGNOSIS — J45909 Unspecified asthma, uncomplicated: Secondary | ICD-10-CM | POA: Diagnosis not present

## 2014-10-18 MED ORDER — DICLOFENAC SODIUM 1 % TD GEL: 4.0000 g | Freq: Three times a day (TID) | TRANSDERMAL | Status: DC

## 2014-10-18 MED ORDER — OXYCODONE-ACETAMINOPHEN 10-325 MG PO TABS: 1.0000 | ORAL_TABLET | Freq: Four times a day (QID) | ORAL | Status: DC | PRN

## 2014-10-18 NOTE — Patient Instructions (Signed)
CONTINUE TO WORK ON STRENGTHENING YOUR RIGHT QUADS AND HAMSTRINGS WITH THERAPY   TRY USING A NEOPRENE KNEE SLEEVE FOR SUPPORT DURING YOUR WALKING AND AS NEEDED DURING THE DAY

## 2014-10-18 NOTE — Progress Notes (Signed)
Subjective:    Patient ID: Linda [name], female    DOB: [birth year], 68 y.o.   MRN: [id number]  HPI  Linda [name] is back after her rehab stay. She has had increased pain in her low back with some radiation into her legs. She has had synvisc injections a couple weeks ago. They appeared to have helped in the left knee but not the right knee. She is experiencing some swelling in the right knee as well. Weight bearing and walking makes the pain worse.   For pain relief she is taking ES tylenol. She sometimes uses heat and ice which helps temporarily.   She is involved in PT who is working on her strength, balance, gait/stamina.   She feels that something changed about a week ago and her pain has gotten worse.    Pain Inventory Average Pain 5 Pain Right Now 8 My pain is constant, sharp and aching  In the last 24 hours, has pain interfered with the following? General activity 7 Relation with others 8 Enjoyment of life 10 What TIME of day is your pain at its worst? daytime,evening, night Sleep (in general) Poor  Pain is worse with: walking, bending, standing and some activites Pain improves with: rest Relief from Meds: 0  Mobility walk with assistance use a walker ability to climb steps?  no do you drive?  yes Do you have any goals in this area?  yes  Function retired  Neuro/Psych bladder control problems weakness numbness trouble walking depression anxiety  Prior Studies Any changes since last visit?  yes knee and leg pain  Physicians involved in your care Any changes since last visit?  no   Family History  Problem Relation Age of Onset  . Arthritis Mother   . Kidney disease Mother   . Heart disease Father   . Stroke Father   . Hypertension Father   . Diabetes Father    History   Social History  . Marital Status: Widowed    Spouse Name: N/A    Number of Children: N/A  . Years of Education: N/A   Social History Main Topics  . Smoking status:  Never Smoker   . Smokeless tobacco: Never Used     Comment: never used tobacco  . Alcohol Use: No  . Drug Use: No  . Sexual Activity: Not Currently   Other Topics Concern  . None   Social History Narrative   Widowed, 2 sons.  Relocated to [place] 09/2012 to be closer to her son who has MS.   Husband d 2015--mesothelioma.   Occupation: former Pharmacist, hospital.   Education: masters degree level.   No T/A/Ds.   Past Surgical History  Procedure Laterality Date  . Appendectomy  1960  . Total abdominal hysterectomy  1974  . Tendon release  1996    Right forearm and hand  . Knee surgery  2005  . Heel spur surgery Left 2008  . Plantar fascia release Left 2008  . Axillary surgery Left 1978    Multiple "lump" in armpit per pt  . Coccyx removal  1972  . Cardiac catheterization  01/2013    nonobstructive CAD, EF 55-60%  . Transthoracic echocardiogram  01/2013; 04/2014    2014--NORMAL.  2015--focal basal septal hypertrophy, EF 55-60%, grade I diast dysfxn, mild LAE.    . Dilation and curettage of uterus  ? 1970's  . Eye surgery Left 2012-2013    "injections for ~ 1 yr; don't really know what for" (  07/12/2013)  . Spirometry  04/25/14    In hosp for acute asthma/COPD flare: mixed obstructive and restrictive lung disease. The FEV1 is severely reduced at 45% predicted.  FEV1 signif decreased compared to prior spirometry 07/23/13.  [name] Kitchen Esophagogastroduodenoscopy N/A 07/19/2014    Gastritis found + in the setting of supratherapeutic INR, +plavix, + meloxicam.   Past Medical History  Diagnosis Date  . HTN (hypertension)   . Depression   . Recurrent UTI     +hx of hospitalization for pyelonephritis  . Hay fever   . Mixed incontinence urge and stress   . Diverticular disease   . Insomnia   . Fibromyalgia     Patient states dx was around her late 26s but she had sx's for years prior to this.  . Syncope     +Hypotensive; ED visit--Dr. Terrence Dupont did Cath--nonobstructive CAD, EF 55-60%  . Idiopathic  angio-edema-urticaria 72014    Angioedema component was very minimal  . 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 Asthma     + asbestososis   . History of pneumonia     hospitalized 12/2011, 02/2013, and 07/2013 [facility]) for this  . High cholesterol     "I'm on Lipitor cause it will help my heart in some way" (07/12/2013)  . Anginal pain     Nonobstructive CAD 2014  . OSA on CPAP   . Iron deficiency anemia     Hematologist in [place], [place] did extensive w/u; no cause found; failed oral supplement;; gets fairly regular (q58m or so) IV iron infusions (Venofer   200mg  with procrit.  "for 14 yr I've been getting blood work q month & getting infusions prn" (07/12/2013).  Dr. Marin Olp locally, iron infusions done, then erythropoietin deficiency diagnosed: aranesp to be started as of 09/2013.  . H/O hiatal hernia   . Migraine syndrome     "not as often anymore; used to be ~ q wk" (07/12/2013)  . Tension headache, chronic   . DDD (degenerative disc disease)     lumbar and cervical.   . Osteoarthritis     "severe; progressing fast" (07/12/2013)  . Chronic lower back pain   . Anxiety     panic attacks  . Nephrolithiasis     "passed all on my own or they are still in there" (07/12/2013)  . Pyelonephritis     "several times over the last 30 yr" (07/12/2013)  . Diastolic congestive heart failure   . COPD (chronic obstructive pulmonary disease)   . Pulmonary embolism 07/2013    coumadin needed x 6 mo; Dx at [facility] with very small peripheral upper lobe pe 07/2013    . Pleural plaque with presence of asbestos 07/22/2013  . BPPV (benign paroxysmal positional vertigo) 12/16/2012  . RBBB (right bundle branch block)   . Pernicious anemia 08/24/2014  . Acute upper GI bleed 06/2014    while pt taking coumadin, plavix, and meloxicam---despite being told not to take coumadin.   BP 125/71  Pulse 77  Resp 14  Ht 5\' 4"  (1.626 m)   Wt 176 lb (79.833 kg)  BMI 30.20 kg/m2  SpO2 95%  Opioid Risk Score:   Fall Risk Score:    Review of Systems     Objective:   Physical Exam  Constitutional: She is oriented to person, place, and time. She appears well-developed and well-nourished.  No distress  HENT: oral mucosa pink, dentition good  Head: Normocephalic and atraumatic.  Eyes: Conjunctivae are normal. Pupils are equal, round, and reactive to light.  Neck: Normal range of motion. Neck supple.  Cardiovascular: Normal rate and regular rhythm. No murmurs  Respiratory: Effort normal and breath sounds normal. No respiratory distress. She has no wheezes.  GI: Soft. Bowel sounds are normal. She exhibits no distension. There is no tenderness.  Neurological: She is alert and oriented to person, place, and time. Mild sensory loss over right foot and ulnar hand is persistent---otherwise normal sensory exam. Strength grossly 4+/5 in bilateral UE's. LE's 4/5 HF and Knees, 4/5 ankles.. Normal rom and fmc. Cognitively displays good insight and awareness.  Skin: Skin is warm and dry.eczematous patches  M/s: pain in medial right knee. Mild crepitus. Some surrounding effsuion. mcmurray's + for medial joint line pain. Lateral joint also tender. Antalgia with weight bearing on right. Psychiatric:  , flat affect  But pleasant  Assessment/Plan:  1. Functional deficits secondary to Gait disorder due to multiple medical and psychosocial issues---most notably her GI blood loss and loss of husband.  2. DVT Prophylaxis/Anticoagulation: SCD's, ambulation  3. Back pain/fibromyalgia/R>L Knee OA Pain Management: Will change percocet To home dose Continue lidocaine patch, Lyrica ,    -add voltaren gel apply TID to knees  -xrays of right knee to assess severity of OA. LIKELY meniscal component also  -knee sleeve for support  -continued exercise to build up quads, hamstrings.   -percocet 10/325 one q 6 prn #90  -CSA was signed  4. Depression with  anxiety/Grief reaction/Mood: Trazodone/cymbalta .   5. [name] with asthma: Oxygen dependent. Albuterol prn.   Return in about 1 month (around 11/18/2014). Thirty minutes of face to face patient care time were spent during this visit. All questions were encouraged and answered.

## 2014-10-19 ENCOUNTER — Ambulatory Visit
Admission: RE | Admit: 2014-10-19 | Discharge: 2014-10-19 | Disposition: A | Discharge status: Home / Self Care | Payer: Medicare Other | Source: Ambulatory Visit | Attending: Physical Medicine & Rehabilitation | Admitting: Physical Medicine & Rehabilitation

## 2014-10-19 DIAGNOSIS — M1990 Unspecified osteoarthritis, unspecified site: Secondary | ICD-10-CM | POA: Diagnosis not present

## 2014-10-19 DIAGNOSIS — M797 Fibromyalgia: Secondary | ICD-10-CM | POA: Diagnosis not present

## 2014-10-19 DIAGNOSIS — I5030 Unspecified diastolic (congestive) heart failure: Secondary | ICD-10-CM | POA: Diagnosis not present

## 2014-10-19 DIAGNOSIS — M1711 Unilateral primary osteoarthritis, right knee: Secondary | ICD-10-CM | POA: Diagnosis not present

## 2014-10-19 DIAGNOSIS — J449 Chronic obstructive pulmonary disease, unspecified: Secondary | ICD-10-CM | POA: Diagnosis not present

## 2014-10-19 DIAGNOSIS — F329 Major depressive disorder, single episode, unspecified: Secondary | ICD-10-CM | POA: Diagnosis not present

## 2014-10-19 DIAGNOSIS — D509 Iron deficiency anemia, unspecified: Secondary | ICD-10-CM | POA: Diagnosis not present

## 2014-10-20 DIAGNOSIS — F329 Major depressive disorder, single episode, unspecified: Secondary | ICD-10-CM | POA: Diagnosis not present

## 2014-10-20 DIAGNOSIS — I5030 Unspecified diastolic (congestive) heart failure: Secondary | ICD-10-CM | POA: Diagnosis not present

## 2014-10-20 DIAGNOSIS — D509 Iron deficiency anemia, unspecified: Secondary | ICD-10-CM | POA: Diagnosis not present

## 2014-10-20 DIAGNOSIS — J449 Chronic obstructive pulmonary disease, unspecified: Secondary | ICD-10-CM | POA: Diagnosis not present

## 2014-10-20 DIAGNOSIS — M797 Fibromyalgia: Secondary | ICD-10-CM | POA: Diagnosis not present

## 2014-10-20 DIAGNOSIS — M1990 Unspecified osteoarthritis, unspecified site: Secondary | ICD-10-CM | POA: Diagnosis not present

## 2014-10-24 ENCOUNTER — Other Ambulatory Visit (HOSPITAL_BASED_OUTPATIENT_CLINIC_OR_DEPARTMENT_OTHER): Payer: Medicare Other | Admitting: Lab

## 2014-10-24 ENCOUNTER — Encounter: Payer: Self-pay | Admitting: Hematology & Oncology

## 2014-10-24 ENCOUNTER — Ambulatory Visit (HOSPITAL_BASED_OUTPATIENT_CLINIC_OR_DEPARTMENT_OTHER): Payer: Medicare Other | Admitting: Hematology & Oncology

## 2014-10-24 ENCOUNTER — Ambulatory Visit (HOSPITAL_BASED_OUTPATIENT_CLINIC_OR_DEPARTMENT_OTHER): Payer: Medicare Other

## 2014-10-24 VITALS — BP 122/59 | HR 72 | Temp 98.1°F | Resp 16 | Ht 64.0 in | Wt 177.0 lb

## 2014-10-24 DIAGNOSIS — D510 Vitamin B12 deficiency anemia due to intrinsic factor deficiency: Secondary | ICD-10-CM | POA: Diagnosis not present

## 2014-10-24 DIAGNOSIS — D509 Iron deficiency anemia, unspecified: Secondary | ICD-10-CM

## 2014-10-24 DIAGNOSIS — I2699 Other pulmonary embolism without acute cor pulmonale: Secondary | ICD-10-CM

## 2014-10-24 DIAGNOSIS — M797 Fibromyalgia: Secondary | ICD-10-CM

## 2014-10-24 LAB — CBC WITH DIFFERENTIAL (CANCER CENTER ONLY)
BASO#: 0 10*3/uL (ref 0.0–0.2)
BASO%: 0.3 % (ref 0.0–2.0)
EOS ABS: 0.1 10*3/uL (ref 0.0–0.5)
EOS%: 1 % (ref 0.0–7.0)
HCT: 39.5 % (ref 34.8–46.6)
HEMOGLOBIN: 13.1 g/dL (ref 11.6–15.9)
LYMPH#: 2 10*3/uL (ref 0.9–3.3)
LYMPH%: 33.8 % (ref 14.0–48.0)
MCH: 30.5 pg (ref 26.0–34.0)
MCHC: 33.2 g/dL (ref 32.0–36.0)
MCV: 92 fL (ref 81–101)
MONO#: 0.5 10*3/uL (ref 0.1–0.9)
MONO%: 9 % (ref 0.0–13.0)
NEUT#: 3.3 10*3/uL (ref 1.5–6.5)
NEUT%: 55.9 % (ref 39.6–80.0)
Platelets: 194 10*3/uL (ref 145–400)
RBC: 4.3 10*6/uL (ref 3.70–5.32)
RDW: 12 % (ref 11.1–15.7)
WBC: 5.9 10*3/uL (ref 3.9–10.0)

## 2014-10-24 LAB — COMPREHENSIVE METABOLIC PANEL
ALK PHOS: 90 U/L (ref 39–117)
ALT: 11 U/L (ref 0–35)
AST: 15 U/L (ref 0–37)
Albumin: 4 g/dL (ref 3.5–5.2)
BILIRUBIN TOTAL: 0.3 mg/dL (ref 0.2–1.2)
BUN: 10 mg/dL (ref 6–23)
CO2: 29 meq/L (ref 19–32)
Calcium: 9.1 mg/dL (ref 8.4–10.5)
Chloride: 106 mEq/L (ref 96–112)
Creatinine, Ser: 0.89 mg/dL (ref 0.50–1.10)
GLUCOSE: 101 mg/dL — AB (ref 70–99)
Potassium: 4.1 mEq/L (ref 3.5–5.3)
Sodium: 143 mEq/L (ref 135–145)
Total Protein: 6.5 g/dL (ref 6.0–8.3)

## 2014-10-24 LAB — RETICULOCYTES (CHCC)
ABS Retic: 56.2 10*3/uL (ref 19.0–186.0)
RBC.: 4.32 MIL/uL (ref 3.87–5.11)
RETIC CT PCT: 1.3 % (ref 0.4–2.3)

## 2014-10-24 LAB — IRON AND TIBC CHCC
%SAT: 47 % (ref 21–57)
Iron: 102 ug/dL (ref 41–142)
TIBC: 219 ug/dL — AB (ref 236–444)
UIBC: 117 ug/dL — ABNORMAL LOW (ref 120–384)

## 2014-10-24 LAB — PROTIME-INR (CHCC SATELLITE)
INR: 1 — AB (ref 2.0–3.5)
PROTIME: 12 s (ref 10.6–13.4)

## 2014-10-24 LAB — FERRITIN CHCC

## 2014-10-24 MED ORDER — CYANOCOBALAMIN 1000 MCG/ML IJ SOLN
INTRAMUSCULAR | Status: AC
  Filled 2014-10-24: qty 1

## 2014-10-24 MED ORDER — PREGABALIN 300 MG PO CAPS: ORAL_CAPSULE | ORAL | Status: DC

## 2014-10-24 MED ORDER — CYANOCOBALAMIN 1000 MCG/ML IJ SOLN
1000.0000 ug | Freq: Once | INTRAMUSCULAR | Status: AC
  Administered 2014-10-24: 1000 ug via INTRAMUSCULAR

## 2014-10-24 NOTE — Patient Instructions (Signed)

## 2014-10-25 NOTE — Telephone Encounter (Signed)
Knee xrays with substantial 3 compartment arthritis----how are her pain levels? Were med changes/additions helpful?

## 2014-10-25 NOTE — Progress Notes (Signed)
Hematology and Oncology Follow Up Visit  Linda [name] [id number] December 10, 1946 68 y.o. 10/25/2014   Principle Diagnosis:   Idiopathic pulmonary embolism  [name] with chronic oxygen use  Recurrent iron deficiency anemia  Pernicious anemia  Congestive heart failure  Current Therapy:    IV iron as indicated  Vitamin B12 1 mg IM every month  Coumadin - 2 years of duration     Interim History:  Ms.  Linda [name] is back for followup. She is in a rehabilitation facility. She is having issues. She's having more problems with pain.  She said that her Lyrica dose was decreased. She was on 300 mg twice a day. She thinks with the higher dose that she was on, this was better for her pain. As such, we will get her back on the 3 mg twice a day dose. She is on a fentanyl patch. She is on supplemental oxygen. Her breathing seems be doing okay. She is on some inhalers. She has problems with her right knee. There is some swelling. I don't know she will need any type of surgery for this.  Her appetite is okay. There is no nausea or vomiting.  She still is having a tough time with her husband's death. He passed away 6 months ago from mesothelioma.   She still has some family issues. I'm not sure if this will ever be resolved. Medications: Current outpatient prescriptions: albuterol (PROVENTIL HFA;VENTOLIN HFA) 108 (90 BASE) MCG/ACT inhaler, Inhale 2 puffs into the lungs every 6 (six) hours as needed for wheezing or shortness of breath., Disp: 1 Inhaler, Rfl: 1;  ALPRAZolam (XANAX) 1 MG tablet, Take 1 tablet (1 mg total) by mouth 3 (three) times daily., Disp: 90 tablet, Rfl: 5 atorvastatin (LIPITOR) 20 MG tablet, Take 1 tablet (20 mg total) by mouth daily at 6 PM., Disp: 30 tablet, Rfl: 6;  budesonide-formoterol (SYMBICORT) 160-4.5 MCG/ACT inhaler, Inhale 2 puffs into the lungs 2 (two) times daily., Disp: 1 Inhaler, Rfl: 12;  Cholecalciferol (VITAMIN D3) 50000 UNITS CAPS, Take 50,000 Units by mouth  once a week., Disp: 10 capsule, Rfl: 0 diclofenac sodium (VOLTAREN) 1 % GEL, Apply 4 g topically 3 (three) times daily. TO BOTH KNEES, Disp: 100 g, Rfl: 6;  DULoxetine (CYMBALTA) 60 MG capsule, Take 1 capsule (60 mg total) by mouth daily., Disp: 30 capsule, Rfl: 6;  fentaNYL (DURAGESIC - DOSED MCG/HR) 25 MCG/HR patch, Place 1 patch (25 mcg total) onto the skin every 3 (three) days., Disp: 10 patch, Rfl: 0 folic acid (FOLVITE) 1 MG tablet, Take 2 tablets (2 mg total) by mouth daily., Disp: 60 tablet, Rfl: 6;  isosorbide mononitrate (IMDUR) 30 MG 24 hr tablet, Take 1 tablet (30 mg total) by mouth daily., Disp: 30 tablet, Rfl: 6;  MAGNESIUM CARBONATE PO, Take 400 mg by mouth every morning., Disp: , Rfl: ;  nebivolol (BYSTOLIC) 5 MG tablet, Take 1 tablet (5 mg total) by mouth daily., Disp: 30 tablet, Rfl: 6 oxyCODONE-acetaminophen (PERCOCET) 10-325 MG per tablet, Take 1 tablet by mouth every 6 (six) hours as needed for pain., Disp: 90 tablet, Rfl: 0;  potassium chloride SA (K-DUR,KLOR-CON) 20 MEQ tablet, Take 2 tablets (40 mEq total) by mouth once., Disp: 60 tablet, Rfl: 6;  pregabalin (LYRICA) 300 MG capsule, Take 1 capsule twice a day, Disp: 60 capsule, Rfl: 5 traZODone (DESYREL) 50 MG tablet, Take 3 tablets (150 mg total) by mouth at bedtime., Disp: 90 tablet, Rfl: 6;  albuterol (PROVENTIL) (2.5 MG/3ML) 0.083% nebulizer solution, Take  3 mLs (2.5 mg total) by nebulization every 6 (six) hours as needed for wheezing or shortness of breath., Disp: 75 mL, Rfl: 1;  ondansetron (ZOFRAN) 4 MG tablet, Take 1 tablet (4 mg total) by mouth every 6 (six) hours., Disp: 12 tablet, Rfl: 0 pantoprazole (PROTONIX) 40 MG tablet, Take 1 tablet (40 mg total) by mouth daily., Disp: 30 tablet, Rfl: 6;  polyethylene glycol powder (GLYCOLAX/MIRALAX) powder, Take 17 g by mouth daily., Disp: 3350 g, Rfl: 6 No current facility-administered medications for this visit. Facility-Administered Medications Ordered in Other Visits: ferumoxytol  (FERAHEME) 510 mg in sodium chloride 0.9 % 100 mL IVPB, 510 mg, Intravenous, Once, Volanda Napoleon, MD  Allergies:  Allergies  Allergen Reactions  . Oxycodone     Son asked [place] not to prescribe narcotics for patient due to patient dependence.  See 08/26/14 note  . Penicillins Itching, Swelling and Rash    Tolerated Cefepime in ED.    Past Medical History, Surgical history, Social history, and Family History were reviewed and updated.  Review of Systems: As above  Physical Exam:  height is 5\' 4"  (1.626 m) and weight is 177 lb (80.287 kg). Her oral temperature is 98.1 F (36.7 C). Her blood pressure is 122/59 and her pulse is 72. Her respiration is 16 and oxygen saturation is 95%.   Chronically ill appear white female. She is on oxygen. Head and neck exam shows no ocular or oral lesions. Shows no palpable cervical or supraclavicular lymph nodes. Lungs are clear. She has some crackles in the bases. Cardiac exam regular in rhythm with a normal S1 and S2. There are no murmurs rubs or bruits. Abdomen is soft. Has good bowel sounds. There is no fluid wave. There is no palpable liver or spleen tip. Back exam shows some tenderness over the spine ribs and hips bilaterally. No erythema is noted over the hips. Extremities shows some significant osteoarthritic changes in her joints. She has some slight joint swelling in her hands. She decreased range of motion of some of her joints. Skin exam shows no rashes. Lower Shoney's shows some chronic trace edema.  Lab Results  Component Value Date   WBC 5.9 10/24/2014   HGB 13.1 10/24/2014   HCT 39.5 10/24/2014   MCV 92 10/24/2014   PLT 194 10/24/2014     Chemistry      Component Value Date/Time   NA 143 10/24/2014 1148   NA 146* 08/24/2014 1124   K 4.1 10/24/2014 1148   K 3.5 08/24/2014 1124   CL 106 10/24/2014 1148   CL 101 08/24/2014 1124   CO2 29 10/24/2014 1148   CO2 30 08/24/2014 1124   BUN 10 10/24/2014 1148   BUN 12 08/24/2014 1124    CREATININE 0.89 10/24/2014 1148   CREATININE 0.7 08/24/2014 1124      Component Value Date/Time   CALCIUM 9.1 10/24/2014 1148   CALCIUM 9.2 08/24/2014 1124   ALKPHOS 90 10/24/2014 1148   ALKPHOS 64 08/24/2014 1124   AST 15 10/24/2014 1148   AST 22 08/24/2014 1124   ALT 11 10/24/2014 1148   ALT 21 08/24/2014 1124   BILITOT 0.3 10/24/2014 1148   BILITOT 0.60 08/24/2014 1124         Impression and Plan: Ms. Cannedy is 68 year old female with multiple medical problems. Again, she is try to deal with her husband's death. He had a mesothelioma.  We will go ahead and give her vitamin B12 today. I told her that  she did not need any iron.her ferritin was 1900. Her iron saturation was 47%.  I spent about 30 minutes with her today. I just listen to her and tried to reassure her and to give her some comfort and let her no that we will certainly try to help her out.   We will plan to get her back in another month. Volanda Napoleon, MD 11/3/20158:09 AM

## 2014-10-26 NOTE — Telephone Encounter (Signed)
I spoke with Linda [name] and her pain is better (ofcourse she is using both fentanyl and the percocet).  She says she is planning to not use fentanyl after this.  I explained to her that she cannot have two physcians prescribing narcotics going forward and she signed a contract with [name].  She is following up with me on 11/15/14 for an RN med refill visit and will receive percocet refill only at that time.

## 2014-10-26 NOTE — Telephone Encounter (Signed)
Knee xray note from [name] attached to phone message from another office in error. We prescribed percocet 10/18/14 for Ms Linda [name] and Dr Anitra Lauth has prescribed Fentanyl on 10/13/14 (filled 10/17/14 by [facility]).  Your note doesn't look like this was disclosde to [place].  How do you want to proceed?  We had her sign a contract.

## 2014-10-28 DIAGNOSIS — M1990 Unspecified osteoarthritis, unspecified site: Secondary | ICD-10-CM | POA: Diagnosis not present

## 2014-10-28 DIAGNOSIS — D509 Iron deficiency anemia, unspecified: Secondary | ICD-10-CM | POA: Diagnosis not present

## 2014-10-28 DIAGNOSIS — J449 Chronic obstructive pulmonary disease, unspecified: Secondary | ICD-10-CM | POA: Diagnosis not present

## 2014-10-28 DIAGNOSIS — F329 Major depressive disorder, single episode, unspecified: Secondary | ICD-10-CM | POA: Diagnosis not present

## 2014-10-28 DIAGNOSIS — I5030 Unspecified diastolic (congestive) heart failure: Secondary | ICD-10-CM | POA: Diagnosis not present

## 2014-10-28 DIAGNOSIS — M797 Fibromyalgia: Secondary | ICD-10-CM | POA: Diagnosis not present

## 2014-11-02 ENCOUNTER — Ambulatory Visit: Payer: Medicare Other | Admitting: Family Medicine

## 2014-11-03 DIAGNOSIS — I5030 Unspecified diastolic (congestive) heart failure: Secondary | ICD-10-CM | POA: Diagnosis not present

## 2014-11-03 DIAGNOSIS — J449 Chronic obstructive pulmonary disease, unspecified: Secondary | ICD-10-CM | POA: Diagnosis not present

## 2014-11-03 DIAGNOSIS — D509 Iron deficiency anemia, unspecified: Secondary | ICD-10-CM | POA: Diagnosis not present

## 2014-11-03 DIAGNOSIS — M1990 Unspecified osteoarthritis, unspecified site: Secondary | ICD-10-CM | POA: Diagnosis not present

## 2014-11-03 DIAGNOSIS — F329 Major depressive disorder, single episode, unspecified: Secondary | ICD-10-CM | POA: Diagnosis not present

## 2014-11-03 DIAGNOSIS — M797 Fibromyalgia: Secondary | ICD-10-CM | POA: Diagnosis not present

## 2014-11-07 DIAGNOSIS — G4733 Obstructive sleep apnea (adult) (pediatric): Secondary | ICD-10-CM | POA: Diagnosis not present

## 2014-11-07 DIAGNOSIS — M1711 Unilateral primary osteoarthritis, right knee: Secondary | ICD-10-CM | POA: Diagnosis not present

## 2014-11-07 DIAGNOSIS — M1712 Unilateral primary osteoarthritis, left knee: Secondary | ICD-10-CM | POA: Diagnosis not present

## 2014-11-14 ENCOUNTER — Telehealth: Payer: Self-pay | Admitting: *Deleted

## 2014-11-14 DIAGNOSIS — M1711 Unilateral primary osteoarthritis, right knee: Secondary | ICD-10-CM

## 2014-11-14 DIAGNOSIS — M797 Fibromyalgia: Secondary | ICD-10-CM

## 2014-11-14 MED ORDER — OXYCODONE-ACETAMINOPHEN 10-325 MG PO TABS: 1.0000 | ORAL_TABLET | Freq: Four times a day (QID) | ORAL | Status: DC | PRN

## 2014-11-14 NOTE — Telephone Encounter (Signed)
Rx printed for percocet 10/325 #90 -1 q 6 hr prn  One month supply for [name] to sign for her med refill visit tomorrow with me..  She should not be using the fentanyl any longer as she explained on the phone when I called to ask about getting fentanyl from Dr Ernestine Conrad and Percocet from [name].  She signed a contract and should not be receiving any controlled substance from other providers.  There is question about the family not wanting her to have medication but I can find no actual HCPOA in chart.  A urine drug screen will be collected tomorrow and reviewed when it comes back for consistency.  Her last rx was given on 10/18/14 and though she is only given #90 with directions she may take up to 4 times daily, the disp amount does not allow her to do this.  I would expect she have some pills at the count tomorrow as the 30 day mark is 11/16/14 so if #0 the count will not be completely inappropriate.

## 2014-11-15 ENCOUNTER — Encounter: Payer: Self-pay | Admitting: *Deleted

## 2014-11-15 ENCOUNTER — Encounter: Payer: Medicare Other | Attending: Physical Medicine & Rehabilitation | Admitting: *Deleted

## 2014-11-15 ENCOUNTER — Other Ambulatory Visit: Payer: Self-pay | Admitting: Physical Medicine & Rehabilitation

## 2014-11-15 VITALS — BP 133/74 | HR 78 | Resp 14 | Wt 174.0 lb

## 2014-11-15 DIAGNOSIS — R2689 Other abnormalities of gait and mobility: Secondary | ICD-10-CM

## 2014-11-15 DIAGNOSIS — Z5181 Encounter for therapeutic drug level monitoring: Secondary | ICD-10-CM | POA: Diagnosis present

## 2014-11-15 DIAGNOSIS — M1711 Unilateral primary osteoarthritis, right knee: Secondary | ICD-10-CM

## 2014-11-15 DIAGNOSIS — M797 Fibromyalgia: Secondary | ICD-10-CM

## 2014-11-15 DIAGNOSIS — Z79899 Other long term (current) drug therapy: Secondary | ICD-10-CM

## 2014-11-15 NOTE — Progress Notes (Signed)
Here for pill count and med refill. Random UDS collected today. I have asked about HCPOA because we do not have one in the chart.  She says her son has it but as long as she is in sound mind she will make her decisions.  I informed her we need a copy in the system in case she should ever be hospitalized and unable to make decisions it would be accessible. I questioned her about if she was off of the fentanyl and she says that she is.  Her pain is under control now.  She did not bring her bottle but says that the nurse at [facility] has the bottle.  I asked that she bring to appointments. I have given her a refill on her oxycodone 10/325 and discussed that she can take q 6 hr but only gets enough to take 3 x day and not 4.  She understands this.  She will return to see Zella Ball next month for refill.  She has not had any falls and has not been out of the country in past 21 days.

## 2014-11-16 ENCOUNTER — Ambulatory Visit (INDEPENDENT_AMBULATORY_CARE_PROVIDER_SITE_OTHER): Payer: Medicare Other | Admitting: Family Medicine

## 2014-11-16 ENCOUNTER — Encounter: Payer: Self-pay | Admitting: Family Medicine

## 2014-11-16 VITALS — BP 136/81 | HR 78 | Temp 97.2°F | Resp 18 | Ht 63.0 in | Wt 172.0 lb

## 2014-11-16 DIAGNOSIS — G894 Chronic pain syndrome: Secondary | ICD-10-CM | POA: Diagnosis not present

## 2014-11-16 DIAGNOSIS — E876 Hypokalemia: Secondary | ICD-10-CM | POA: Diagnosis not present

## 2014-11-16 DIAGNOSIS — F4320 Adjustment disorder, unspecified: Secondary | ICD-10-CM | POA: Diagnosis not present

## 2014-11-16 DIAGNOSIS — F419 Anxiety disorder, unspecified: Secondary | ICD-10-CM

## 2014-11-16 DIAGNOSIS — E785 Hyperlipidemia, unspecified: Secondary | ICD-10-CM

## 2014-11-16 DIAGNOSIS — F418 Other specified anxiety disorders: Secondary | ICD-10-CM

## 2014-11-16 DIAGNOSIS — F329 Major depressive disorder, single episode, unspecified: Secondary | ICD-10-CM

## 2014-11-16 DIAGNOSIS — J4550 Severe persistent asthma, uncomplicated: Secondary | ICD-10-CM

## 2014-11-16 DIAGNOSIS — I10 Essential (primary) hypertension: Secondary | ICD-10-CM | POA: Diagnosis not present

## 2014-11-16 LAB — PMP ALCOHOL METABOLITE (ETG): ETGU: NEGATIVE ng/mL

## 2014-11-16 NOTE — Progress Notes (Signed)
OFFICE NOTE  11/16/2014  CC:  Chief Complaint  Patient presents with  . Follow-up  . Medication Problem    pt states potassium is causing GERD.    HPI: Patient is a 68 y.o. Caucasian female who is here for 6 wk f/u HTN, hyperlipidemia, anx/dep, fibromyalgia/hx of chronic osteoarthritic pain/chronic pain syndrome, asthma w/hx of asbestos exposure. Sees Dr. Naaman Plummer for pain mgmt and Dr. Marin Olp in Hematology for chronic iron deficiency.    She saw Dr. Camillo Flaming (pulm) and had a sleep study and PFTs and goes back soon (11/29/14) to get results.  She remains on 3L oxygen 24/7.  Continues to live at ALF.  She feels like she is still struggling quite a bit with pain, no big change there. She asks me if something can be done about her potassium pills.  She has been consistently in low-normal range on BMETs, no diuretics anymore, I started daily potassium supplement after 10/05/14 K level was 3.3. On 10/24/14 it had come back up to 4.1.  She continues to take daily supplemental pills as rx'd but says they are very uncomfortable to swallow plus cause prolonged feeling of GER after taking them.  Pertinent PMH:  Past medical, surgical, social, and family history reviewed and no changes are noted since last office visit.  MEDS:  Outpatient Prescriptions Prior to Visit  Medication Sig Dispense Refill  . albuterol (PROVENTIL HFA;VENTOLIN HFA) 108 (90 BASE) MCG/ACT inhaler Inhale 2 puffs into the lungs every 6 (six) hours as needed for wheezing or shortness of breath. 1 Inhaler 1  . albuterol (PROVENTIL) (2.5 MG/3ML) 0.083% nebulizer solution Take 3 mLs (2.5 mg total) by nebulization every 6 (six) hours as needed for wheezing or shortness of breath. 75 mL 1  . ALPRAZolam (XANAX) 1 MG tablet Take 1 tablet (1 mg total) by mouth 3 (three) times daily. 90 tablet 5  . atorvastatin (LIPITOR) 20 MG tablet Take 1 tablet (20 mg total) by mouth daily at 6 PM. 30 tablet 6  . budesonide-formoterol (SYMBICORT) 160-4.5  MCG/ACT inhaler Inhale 2 puffs into the lungs 2 (two) times daily. 1 Inhaler 12  . Cholecalciferol (VITAMIN D3) 50000 UNITS CAPS Take 50,000 Units by mouth once a week. 10 capsule 0  . diclofenac sodium (VOLTAREN) 1 % GEL Apply 4 g topically 3 (three) times daily. TO BOTH KNEES 100 g 6  . DULoxetine (CYMBALTA) 60 MG capsule Take 1 capsule (60 mg total) by mouth daily. 30 capsule 6  . folic acid (FOLVITE) 1 MG tablet Take 2 tablets (2 mg total) by mouth daily. 60 tablet 6  . isosorbide mononitrate (IMDUR) 30 MG 24 hr tablet Take 1 tablet (30 mg total) by mouth daily. 30 tablet 6  . MAGNESIUM CARBONATE PO Take 400 mg by mouth every morning.    . nebivolol (BYSTOLIC) 5 MG tablet Take 1 tablet (5 mg total) by mouth daily. 30 tablet 6  . ondansetron (ZOFRAN) 4 MG tablet Take 1 tablet (4 mg total) by mouth every 6 (six) hours. 12 tablet 0  . oxyCODONE-acetaminophen (PERCOCET) 10-325 MG per tablet Take 1 tablet by mouth every 6 (six) hours as needed for pain. ONE MONTH SUPPLY 90 tablet 0  . pantoprazole (PROTONIX) 40 MG tablet Take 1 tablet (40 mg total) by mouth daily. 30 tablet 6  . polyethylene glycol powder (GLYCOLAX/MIRALAX) powder Take 17 g by mouth daily. 3350 g 6  . potassium chloride SA (K-DUR,KLOR-CON) 20 MEQ tablet Take 2 tablets (40 mEq total) by  mouth once. 60 tablet 6  . pregabalin (LYRICA) 300 MG capsule Take 1 capsule twice a day 60 capsule 5  . traZODone (DESYREL) 50 MG tablet Take 3 tablets (150 mg total) by mouth at bedtime. 90 tablet 6   Facility-Administered Medications Prior to Visit  Medication Dose Route Frequency Provider Last Rate Last Dose  . ferumoxytol (FERAHEME) 510 mg in sodium chloride 0.9 % 100 mL IVPB  510 mg Intravenous Once Volanda Napoleon, MD        PE: Blood pressure 136/81, pulse 78, temperature 97.2 F (36.2 C), temperature source Temporal, resp. rate 18, height 5\' 3"  (1.6 m), weight 172 lb (78.019 kg), SpO2 95 %. 2 L oxygen [place] [facility]: Alert, well appearing.   Patient is oriented to person, place, time, and situation. Ambulating with rolling walker, wearing oxygen via nasal cannulae, pleasant and alert, with lucid thought and conversation. No further exam today.  LABS: none today Recent:   Chemistry      Component Value Date/Time   NA 143 10/24/2014 1148   NA 146* 08/24/2014 1124   K 4.1 10/24/2014 1148   K 3.5 08/24/2014 1124   CL 106 10/24/2014 1148   CL 101 08/24/2014 1124   CO2 29 10/24/2014 1148   CO2 30 08/24/2014 1124   BUN 10 10/24/2014 1148   BUN 12 08/24/2014 1124   CREATININE 0.89 10/24/2014 1148   CREATININE 0.7 08/24/2014 1124      Component Value Date/Time   CALCIUM 9.1 10/24/2014 1148   CALCIUM 9.2 08/24/2014 1124   ALKPHOS 90 10/24/2014 1148   ALKPHOS 64 08/24/2014 1124   AST 15 10/24/2014 1148   AST 22 08/24/2014 1124   ALT 11 10/24/2014 1148   ALT 21 08/24/2014 1124   BILITOT 0.3 10/24/2014 1148   BILITOT 0.60 08/24/2014 1124     Lab Results  Component Value Date   WBC 5.9 10/24/2014   HGB 13.1 10/24/2014   HCT 39.5 10/24/2014   MCV 92 10/24/2014   PLT 194 10/24/2014   Lab Results  Component Value Date   CHOL 152 01/31/2013   HDL 44 01/31/2013   LDLCALC 90 01/31/2013   TRIG 92 01/31/2013   CHOLHDL 3.5 01/31/2013   Lab Results  Component Value Date   TSH 3.736 07/20/2013   Lab Results  Component Value Date   HGBA1C 5.7* 01/31/2013    IMPRESSION AND PLAN:  1) Hypokalemia: mild.  She is not tolerating K supplement.   Stop supplement and start high potassium diet. Recheck BMET 1 mo.  2) HTN;The current medical regimen is effective;  continue present plan and medications.  3) Hyperlipidemia: she is due for FLP but is not fasting.  She says her cardiologist is managing this.  Will keep looking for opportunity at f/u visits to get this lab but today pt states "my cardiologists has me on the medicine for this".  4) Chronic pain: mgmt as per pain mgmt clinic.  5) Anxiety/depression/prolonged  grief reaction (to husband's death): she is improving a little, I think. She still needs lots of social/emotional support from family/caregivers/medical providers.  Continue current meds.  6) Asthma, asbestos lung changes, with chronic oxygen use. Had recent polysomnogram and PFTs: has f/u with her pulmonologist to go over results and plan therapies as appropriate.  Will try to get Dr. Camillo Flaming (pulm) records.  An After Visit Summary was printed and given to the patient.  FOLLOW UP: 4 mo

## 2014-11-16 NOTE — Progress Notes (Signed)
Pre visit review using our clinic review tool, if applicable. No additional management support is needed unless otherwise documented below in the visit note. 

## 2014-11-21 ENCOUNTER — Encounter: Payer: Self-pay | Admitting: Family Medicine

## 2014-11-21 DIAGNOSIS — F329 Major depressive disorder, single episode, unspecified: Secondary | ICD-10-CM | POA: Insufficient documentation

## 2014-11-21 DIAGNOSIS — F4320 Adjustment disorder, unspecified: Secondary | ICD-10-CM | POA: Insufficient documentation

## 2014-11-21 DIAGNOSIS — E876 Hypokalemia: Secondary | ICD-10-CM | POA: Insufficient documentation

## 2014-11-21 DIAGNOSIS — E785 Hyperlipidemia, unspecified: Secondary | ICD-10-CM | POA: Insufficient documentation

## 2014-11-21 DIAGNOSIS — F419 Anxiety disorder, unspecified: Secondary | ICD-10-CM | POA: Insufficient documentation

## 2014-11-21 DIAGNOSIS — G894 Chronic pain syndrome: Secondary | ICD-10-CM | POA: Insufficient documentation

## 2014-11-21 DIAGNOSIS — J4550 Severe persistent asthma, uncomplicated: Secondary | ICD-10-CM | POA: Insufficient documentation

## 2014-11-22 LAB — OPIATES/OPIOIDS (LC/MS-MS)
Codeine Urine: NEGATIVE ng/mL (ref <50)
Hydrocodone: NEGATIVE ng/mL (ref <50)
Hydromorphone: NEGATIVE ng/mL (ref <50)
MORPHINE: NEGATIVE ng/mL (ref <50)
NOROXYCODONE, UR: 3369 ng/mL (ref <50)
Norhydrocodone, Ur: NEGATIVE ng/mL (ref <50)
OXYCODONE, UR: 3639 ng/mL (ref <50)
Oxymorphone: 1641 ng/mL (ref <50)

## 2014-11-22 LAB — MEPERIDINE (GC/LC/MS), URINE
Meperidine (GC/LC/MS), ur confirm: NEGATIVE ng/mL (ref <100)
NORMEPERIDINE UR CONFIRM: NEGATIVE ng/mL (ref <100)

## 2014-11-22 LAB — AMPHETAMINES (GC/LC/MS), URINE
Amphetamine GC/MS Conf: NEGATIVE ng/mL (ref <250)
Methamphetamine Quant, Ur: NEGATIVE ng/mL (ref <250)

## 2014-11-22 LAB — BENZODIAZEPINES (GC/LC/MS), URINE
Alprazolam metabolite (GC/LC/MS), ur confirm: 928 ng/mL (ref <25)
Clonazepam metabolite (GC/LC/MS), ur confirm: NEGATIVE ng/mL (ref <25)
Flurazepam metabolite (GC/LC/MS), ur confirm: NEGATIVE ng/mL (ref <50)
LORAZEPAMU: NEGATIVE ng/mL (ref <50)
Midazolam (GC/LC/MS), ur confirm: NEGATIVE ng/mL (ref <50)
Nordiazepam (GC/LC/MS), ur confirm: NEGATIVE ng/mL (ref <50)
OXAZEPAMU: NEGATIVE ng/mL (ref <50)
TEMAZEPAMU: NEGATIVE ng/mL (ref <50)
Triazolam metabolite (GC/LC/MS), ur confirm: NEGATIVE ng/mL (ref <50)

## 2014-11-22 LAB — OXYCODONE, URINE (LC/MS-MS)
Noroxycodone, Ur: 3369 ng/mL (ref <50)
Oxycodone, ur: 3639 ng/mL (ref <50)
Oxymorphone: 1641 ng/mL (ref <50)

## 2014-11-22 LAB — FENTANYL (GC/LC/MS), URINE
FENTANYL (GC/MS) CONFIRM: NEGATIVE ng/mL (ref <0.5)
NORFENTANYL (GC/MS) CONFIRM: NEGATIVE ng/mL (ref <0.5)

## 2014-11-23 ENCOUNTER — Ambulatory Visit (HOSPITAL_BASED_OUTPATIENT_CLINIC_OR_DEPARTMENT_OTHER): Payer: Medicare Other

## 2014-11-23 DIAGNOSIS — I2699 Other pulmonary embolism without acute cor pulmonale: Secondary | ICD-10-CM

## 2014-11-23 DIAGNOSIS — D510 Vitamin B12 deficiency anemia due to intrinsic factor deficiency: Secondary | ICD-10-CM

## 2014-11-23 DIAGNOSIS — D509 Iron deficiency anemia, unspecified: Secondary | ICD-10-CM

## 2014-11-23 LAB — PRESCRIPTION MONITORING PROFILE (SOLSTAS)
BUPRENORPHINE, URINE: NEGATIVE ng/mL
Barbiturate Screen, Urine: NEGATIVE ng/mL
CANNABINOID SCRN UR: NEGATIVE ng/mL
Carisoprodol, Urine: NEGATIVE ng/mL
Cocaine Metabolites: NEGATIVE ng/mL
Creatinine, Urine: 108.8 mg/dL (ref >20.0)
ECSTASY: NEGATIVE ng/mL
METHADONE SCREEN, URINE: NEGATIVE ng/mL
Nitrites, Initial: NEGATIVE ug/mL
Propoxyphene: NEGATIVE ng/mL
Tapentadol, urine: NEGATIVE ng/mL
Tramadol Scrn, Ur: NEGATIVE ng/mL
ZOLPIDEM, URINE: NEGATIVE ng/mL
pH, Initial: 6 pH (ref 4.5–8.9)

## 2014-11-23 MED ORDER — CYANOCOBALAMIN 1000 MCG/ML IJ SOLN
INTRAMUSCULAR | Status: AC
  Filled 2014-11-23: qty 1

## 2014-11-23 MED ORDER — CYANOCOBALAMIN 1000 MCG/ML IJ SOLN
1000.0000 ug | Freq: Once | INTRAMUSCULAR | Status: AC
  Administered 2014-11-23: 1000 ug via INTRAMUSCULAR

## 2014-11-23 NOTE — Patient Instructions (Signed)

## 2014-11-24 DIAGNOSIS — J45909 Unspecified asthma, uncomplicated: Secondary | ICD-10-CM | POA: Diagnosis not present

## 2014-11-29 ENCOUNTER — Encounter: Payer: Self-pay | Admitting: Registered Nurse

## 2014-11-29 ENCOUNTER — Encounter: Payer: Medicare Other | Attending: Physical Medicine & Rehabilitation | Admitting: Registered Nurse

## 2014-11-29 DIAGNOSIS — M1711 Unilateral primary osteoarthritis, right knee: Secondary | ICD-10-CM | POA: Diagnosis not present

## 2014-11-29 DIAGNOSIS — Z79899 Other long term (current) drug therapy: Secondary | ICD-10-CM | POA: Diagnosis present

## 2014-11-29 DIAGNOSIS — M5440 Lumbago with sciatica, unspecified side: Secondary | ICD-10-CM

## 2014-11-29 DIAGNOSIS — J984 Other disorders of lung: Secondary | ICD-10-CM | POA: Diagnosis not present

## 2014-11-29 DIAGNOSIS — R2689 Other abnormalities of gait and mobility: Secondary | ICD-10-CM | POA: Diagnosis not present

## 2014-11-29 DIAGNOSIS — Z5181 Encounter for therapeutic drug level monitoring: Secondary | ICD-10-CM | POA: Insufficient documentation

## 2014-11-29 DIAGNOSIS — R0902 Hypoxemia: Secondary | ICD-10-CM | POA: Diagnosis not present

## 2014-11-29 DIAGNOSIS — J45909 Unspecified asthma, uncomplicated: Secondary | ICD-10-CM | POA: Diagnosis not present

## 2014-11-29 DIAGNOSIS — M797 Fibromyalgia: Secondary | ICD-10-CM | POA: Insufficient documentation

## 2014-11-29 DIAGNOSIS — J61 Pneumoconiosis due to asbestos and other mineral fibers: Secondary | ICD-10-CM | POA: Diagnosis not present

## 2014-11-29 MED ORDER — OXYCODONE-ACETAMINOPHEN 10-325 MG PO TABS: 1.0000 | ORAL_TABLET | Freq: Four times a day (QID) | ORAL | Status: DC | PRN

## 2014-11-29 NOTE — Progress Notes (Signed)
Subjective:    Patient ID: [name], female    DOB: 01-19-1946, 68 y.o.   MRN: [id number]  HPI: Ms. ALVERDA NAZZARO is a 68 year old female who returns for follow up for chronic pain and medication refill. She says her pain is located in her lower back radiating into her bilateral hips right greater than left. She rates her pain 5. Her current exercise regime attending physical therapy 5 days a week at [facility].  She asked if her pain medications tablets could be increased, at this time we will continue with current medication regime.  Her prescription tablets wer increased to accommodate  A 5 week supplt due to scheduling and her next appointment with Dr. Naaman Plummer 01/16/2015, she verbalizes understanding.  She would like to speak to Dr. Naaman Plummer regarding knee and back injections. She admits to being depressed with the loss of her husband 8 months ago and with the holidays approaching, she is attending [facility] at the [employer] weekly.   Her medical history includes:  history of orthostatic hypotension, asbestosis--oxygen depenedent, chronic pain, anemia, recent UTI who was admitted on 07/17/14 with recurrent orthostatic episodes X 2 weeks, nose bleeds as well as multiple falls. She had been on chronic coumadin, plavix and meloxicam and was found to be anemic with Hgb 5.1 and INR >10.0. CT head with right petrous bone mass and CT abdomen/pelvis without retroperitoneal hematoma. She was transfused with 2 units PRBC and coumadin reversed with FFP and Vit K. She was placed on IV PPI and underwent EGD revealing gastritis as well as gastric nodule on 07/28. Pathology showed reactive gastropathy. Hem/Onc consulted for input on need for anticoagulation and felt that coumadin was not needed as CT chest earlier this year as well as BLE dopplers negative. Dr. Terrence Dupont consulted for input and recommended low dose ASA once cleared per GI. Palliative care consulted for support due to  grief/loss of husband as well as anxiety/agitation/pain issues and is providing ego support as well as Resources on support groups. Patient continues to be limited by deconditioning as well as orthostatic symptoms and CIR recommended for follow up therapy.    Pain Inventory Average Pain 6 Pain Right Now 5 My pain is sharp, burning and aching  In the last 24 hours, has pain interfered with the following? General activity 4 Relation with others 5 Enjoyment of life 7 What TIME of day is your pain at its worst? daytime and evening Sleep (in general) Poor  Pain is worse with: walking, bending, standing and some activites Pain improves with: rest and medication Relief from Meds: 6  Mobility walk without assistance walk with assistance use a walker how many minutes can you walk? 5 ability to climb steps?  no do you drive?  yes  Function retired  Neuro/Psych bladder control problems weakness trouble walking spasms depression  Prior Studies Any changes since last visit?  no  Physicians involved in your care Any changes since last visit?  no   Family History  Problem Relation Age of Onset  . Arthritis Mother   . Kidney disease Mother   . Heart disease Father   . Stroke Father   . Hypertension Father   . Diabetes Father    History   Social History  . Marital Status: Widowed    Spouse Name: N/A    Number of Children: N/A  . Years of Education: N/A   Social History Main Topics  . Smoking status: Never Smoker   .  Smokeless tobacco: Never Used     Comment: never used tobacco  . Alcohol Use: No  . Drug Use: No  . Sexual Activity: Not Currently   Other Topics Concern  . None   Social History Narrative   Widowed, 2 sons.  Relocated to [place] 09/2012 to be closer to her son who has MS.   Husband d 2015--mesothelioma.   Occupation: former Pharmacist, hospital.   Education: masters degree level.   No T/A/Ds.   Past Surgical History  Procedure Laterality Date  . Appendectomy   1960  . Total abdominal hysterectomy  1974  . Tendon release  1996    Right forearm and hand  . Knee surgery  2005  . Heel spur surgery Left 2008  . Plantar fascia release Left 2008  . Axillary surgery Left 1978    Multiple "lump" in armpit per pt  . Coccyx removal  1972  . Cardiac catheterization  01/2013    nonobstructive CAD, EF 55-60%  . Transthoracic echocardiogram  01/2013; 04/2014    2014--NORMAL.  2015--focal basal septal hypertrophy, EF 55-60%, grade I diast dysfxn, mild LAE.    . Dilation and curettage of uterus  ? 1970's  . Eye surgery Left 2012-2013    "injections for ~ 1 yr; don't really know what for" (07/12/2013)  . Spirometry  04/25/14    In hosp for acute asthma/COPD flare: mixed obstructive and restrictive lung disease. The FEV1 is severely reduced at 45% predicted.  FEV1 signif decreased compared to prior spirometry 07/23/13.  [name] Kitchen Esophagogastroduodenoscopy N/A 07/19/2014    Gastritis found + in the setting of supratherapeutic INR, +plavix, + meloxicam.      There were no vitals taken for this visit.  Opioid Risk Score:   Fall Risk Score: Moderate Fall Risk (6-13 points) (previously educated and given handout)  Review of Systems  Respiratory: Positive for shortness of breath.   Genitourinary:       Bladder control problems  Musculoskeletal: Positive for gait problem.       Spasms  Psychiatric/Behavioral: Positive for dysphoric mood.  All other systems reviewed and are negative.      Objective:   Physical Exam  Constitutional: She is oriented to person, place, and time. She appears well-developed and well-nourished.  HENT:  Head: Normocephalic and atraumatic.  Neck: Normal range of motion. Neck supple.  Cardiovascular: Normal rate and regular rhythm.   Pulmonary/Chest: Effort normal and breath sounds normal.  Continuous Oxygen at 3 Liters Nasal Cannula  Musculoskeletal:  Normal Muscle Bulk and Muscle testing reveals: Upper extremities: Full ROM and Muscle  strength 4/5 Thoracic and Lumbar Hypersensitivity Lower extremities: Full ROM and Muscle strength 5/5 Right Leg Flexion Produces pain into Patella Left Leg Flexion Produces pain into Posterior Lower extremities Arises from chair with ease Narrow Based Gait.  Neurological: She is alert and oriented to person, place, and time.  Skin: Skin is warm and dry.  Psychiatric: She has a normal mood and affect.  Nursing note and vitals reviewed.         Assessment & Plan:  1. Functional deficits secondary to Gait disorder: Continue Physical Therapy 5 times a week  2. Chronic Back pain/fibromyalgia/R>L Knee OA Pain Management:  Refilled: Oxycodone 10/325 one tablet every 6 hours #100 5 Week supply. Continue Lyrica and Voltaren Gel  3. Depression with anxiety/Grief reaction/Mood: Continue Trazodone and Cymbalta .  4. [name] with asthma: Oxygen dependent. Albuterol prn.  On Continuous Oxygen Therapy 3 liters/ Pulmonology Following.  30 minutes of face to face patient care time was spent during this visit. All questions were encouraged and answered.  F/U in 1 month

## 2014-11-29 NOTE — Patient Instructions (Signed)
Try Biotin Capsules for Hair Loss  Check with Pharmacist to make sure no contraindication with medications

## 2014-11-30 ENCOUNTER — Telehealth: Payer: Self-pay | Admitting: Family Medicine

## 2014-11-30 ENCOUNTER — Encounter: Payer: Self-pay | Admitting: Registered Nurse

## 2014-11-30 NOTE — Telephone Encounter (Signed)
Received request for RF of vit D 50,000 U dose. I don't have any record of a vit D level in the system. Ask her to come in for lab visit to check Vit D level (25 hydroxy vitamin D level, dx vit D deficiency) and then I'll decide what dose to continue her on. -thx

## 2014-11-30 NOTE — Progress Notes (Signed)
Urine drug screen for this encounter is consistent for prescribed medications.   

## 2014-12-01 ENCOUNTER — Encounter (HOSPITAL_COMMUNITY): Payer: Self-pay | Admitting: Cardiology

## 2014-12-02 ENCOUNTER — Ambulatory Visit: Payer: Medicare Other | Admitting: Registered Nurse

## 2014-12-02 ENCOUNTER — Other Ambulatory Visit: Payer: Self-pay | Admitting: *Deleted

## 2014-12-02 DIAGNOSIS — M1712 Unilateral primary osteoarthritis, left knee: Secondary | ICD-10-CM | POA: Diagnosis not present

## 2014-12-02 DIAGNOSIS — M1711 Unilateral primary osteoarthritis, right knee: Secondary | ICD-10-CM | POA: Diagnosis not present

## 2014-12-02 MED ORDER — VITAMIN D3 1.25 MG (50000 UT) PO CAPS: 50000.0000 [IU] | ORAL_CAPSULE | ORAL | Status: DC

## 2014-12-05 NOTE — Telephone Encounter (Signed)
LMOM for pt to CB.  

## 2014-12-26 ENCOUNTER — Telehealth: Payer: Self-pay | Admitting: Hematology & Oncology

## 2014-12-26 ENCOUNTER — Ambulatory Visit: Payer: Medicare Other

## 2014-12-26 NOTE — Telephone Encounter (Signed)
Pt moved 1-4 to 1-6 °

## 2014-12-28 ENCOUNTER — Ambulatory Visit (HOSPITAL_BASED_OUTPATIENT_CLINIC_OR_DEPARTMENT_OTHER): Payer: Medicare Other

## 2014-12-28 DIAGNOSIS — D509 Iron deficiency anemia, unspecified: Secondary | ICD-10-CM

## 2014-12-28 DIAGNOSIS — D510 Vitamin B12 deficiency anemia due to intrinsic factor deficiency: Secondary | ICD-10-CM | POA: Diagnosis not present

## 2014-12-28 DIAGNOSIS — I2699 Other pulmonary embolism without acute cor pulmonale: Secondary | ICD-10-CM

## 2014-12-28 MED ORDER — CYANOCOBALAMIN 1000 MCG/ML IJ SOLN
1000.0000 ug | Freq: Once | INTRAMUSCULAR | Status: AC
  Administered 2014-12-28: 1000 ug via INTRAMUSCULAR

## 2014-12-28 MED ORDER — CYANOCOBALAMIN 1000 MCG/ML IJ SOLN
INTRAMUSCULAR | Status: AC
  Filled 2014-12-28: qty 1

## 2014-12-28 NOTE — Patient Instructions (Signed)
Cyanocobalamin, Pyridoxine, and Folate What is this medicine? A multivitamin containing folic acid, vitamin B6, and vitamin B12. This medicine may be used for other purposes; ask your health care provider or pharmacist if you have questions. COMMON BRAND NAME(S): AllanFol RX, AllanTex, ComBgen, FaBB, Folamin, Folastin, [place], [place], [place], [place], [place], [place] RX, [place] RX 2.2, [place], [place] 2.2, Foltabs 800, Foltx, Homocysteine Formula, NuFol, TL [employer], Virt-Vite, Virt-Vite [place], Vita-Respa What should I tell my health care provider before I take this medicine? They need to know if you have any of these conditions: -bleeding or clotting disorder -history of anemia of any type -other chronic health condition -an unusual or allergic reaction to vitamins, other medicines, foods, dyes, or preservatives -pregnant or trying to get pregnant -breast-feeding How should I use this medicine? Take by mouth with a glass of water. May take with food. Follow the directions on the prescription label. It is usually given once a day. Do not take your medicine more often than directed. Contact your pediatrician regarding the use of this medicine in children. Special care may be needed. Overdosage: If you think you have taken too much of this medicine contact a poison control center or emergency room at once. NOTE: This medicine is only for you. Do not share this medicine with others. What if I miss a dose? If you miss a dose, take it as soon as you can. If it is almost time for your next dose, take only that dose. Do not take double or extra doses. What may interact with this medicine? -levodopa This list may not describe all possible interactions. Give your health care provider a list of all the medicines, herbs, non-prescription drugs, or dietary supplements you use. Also tell them if you smoke, drink alcohol, or use illegal drugs. Some items may interact with your medicine. What should I  watch for while using this medicine? See your health care professional for regular checks on your progress. Remember that vitamin supplements do not replace the need for good nutrition from a balanced diet. What side effects may I notice from receiving this medicine? Side effects that you should report to your doctor or health care professional as soon as possible: -allergic reaction such as skin rash or difficulty breathing -vomiting Side effects that usually do not require medical attention (report to your doctor or health care professional if they continue or are bothersome): -nausea -stomach upset This list may not describe all possible side effects. Call your doctor for medical advice about side effects. You may report side effects to FDA at 1-800-FDA-1088. Where should I keep my medicine? Keep out of the reach of children. Most vitamins should be stored at controlled room temperature. Check your specific product directions. Protect from heat and moisture. Throw away any unused medicine after the expiration date. NOTE: This sheet is a summary. It may not cover all possible information. If you have questions about this medicine, talk to your doctor, pharmacist, or health care provider.  2015, Elsevier/Gold Standard. (2008-01-30 00:59:55)

## 2015-01-11 ENCOUNTER — Telehealth: Payer: Self-pay | Admitting: *Deleted

## 2015-01-11 DIAGNOSIS — M797 Fibromyalgia: Secondary | ICD-10-CM

## 2015-01-11 DIAGNOSIS — M1711 Unilateral primary osteoarthritis, right knee: Secondary | ICD-10-CM

## 2015-01-11 MED ORDER — OXYCODONE-ACETAMINOPHEN 10-325 MG PO TABS
1.0000 | ORAL_TABLET | Freq: Four times a day (QID) | ORAL | Status: DC | PRN
Start: 2015-01-11 — End: 2015-01-16

## 2015-01-11 NOTE — Telephone Encounter (Signed)
done

## 2015-01-11 NOTE — Telephone Encounter (Signed)
Nurse called from [facility], Pt is going to run out of oxycodone this week and is asking for a script so pt can get a refill

## 2015-01-11 NOTE — Telephone Encounter (Signed)
Notified Mia the prescription is available for pick up tomorrow

## 2015-01-16 ENCOUNTER — Encounter: Payer: Self-pay | Admitting: Physical Medicine & Rehabilitation

## 2015-01-16 ENCOUNTER — Encounter: Payer: Medicare Other | Attending: Physical Medicine & Rehabilitation | Admitting: Physical Medicine & Rehabilitation

## 2015-01-16 VITALS — BP 106/52 | HR 78 | Resp 14

## 2015-01-16 DIAGNOSIS — M797 Fibromyalgia: Secondary | ICD-10-CM | POA: Insufficient documentation

## 2015-01-16 DIAGNOSIS — F329 Major depressive disorder, single episode, unspecified: Secondary | ICD-10-CM

## 2015-01-16 DIAGNOSIS — Z79899 Other long term (current) drug therapy: Secondary | ICD-10-CM | POA: Insufficient documentation

## 2015-01-16 DIAGNOSIS — Z5181 Encounter for therapeutic drug level monitoring: Secondary | ICD-10-CM | POA: Diagnosis not present

## 2015-01-16 DIAGNOSIS — F418 Other specified anxiety disorders: Secondary | ICD-10-CM | POA: Diagnosis not present

## 2015-01-16 DIAGNOSIS — S5401XA Injury of ulnar nerve at forearm level, right arm, initial encounter: Secondary | ICD-10-CM | POA: Insufficient documentation

## 2015-01-16 DIAGNOSIS — R2689 Other abnormalities of gait and mobility: Secondary | ICD-10-CM | POA: Insufficient documentation

## 2015-01-16 DIAGNOSIS — M1711 Unilateral primary osteoarthritis, right knee: Secondary | ICD-10-CM | POA: Diagnosis not present

## 2015-01-16 DIAGNOSIS — F419 Anxiety disorder, unspecified: Secondary | ICD-10-CM

## 2015-01-16 MED ORDER — OXYCODONE-ACETAMINOPHEN 10-325 MG PO TABS: 1.0000 | ORAL_TABLET | Freq: Four times a day (QID) | ORAL | Status: DC | PRN

## 2015-01-16 MED ORDER — DICLOFENAC SODIUM 1 % TD GEL: 4.0000 g | Freq: Three times a day (TID) | TRANSDERMAL | Status: DC

## 2015-01-16 NOTE — Patient Instructions (Signed)
FOLLOW UP WITH DR. DALLDORF REGARDING YOUR NERVE TESTING AND ASSESSMENT OF YOUR ELBOW.  NEOPRENE KNEE SLEEVE FOR YOUR LEFT KNEE TO HELP WITH PAIN, SWELLING, STABILITY  ELEVATE YOUR LEGS WHEN YOU SIT OR SLEEP AT OR ABOVE YOUR HEART  ICE TO YOUR KNEE ARE  HEAT TO THE CALF AREA ON THE LEFT  THIGH (KNEE) HIGH COMPRESSION STOCKINGS WILL ALSO HELP YOUR SWELLING  FOLLOW UP WITH YOUR CARDIOLOGIST.

## 2015-01-16 NOTE — Progress Notes (Signed)
Subjective:    Patient ID: Linda [name], female    DOB: 1946-01-21, 69 y.o.   MRN: [id number]  HPI   Mrs. Tipps is here in follow up her multiple pain issues/gait disorder. She is complaining of substantial numbness in her 4th/5th fingers of right hand. She denies elbow pain per se.  She has an MRI from last March which revealed. She is convinced that she needs elbow surgery. Nerve conduction testing was never done as she canceled with the passing of her husband.   MRI results are as follows.  1. Significant tendinopathy and a deep partial thickness tear of the common extensor tendon. 2. Mild common flexor tendinopathy. 3. Intact medial and lateral collateral ligaments. 4. Mild degenerative changes but no joint effusion or loose bodies. 5. Diaphyseal marrow signal abnormality possibly due to chronic anemia.  She is having ongoing issues regarding her knees. She is non-operative because of her multiple co-morbidities so TKA is off the table. Her knees throb posteriorly and anteriorly with sensitivity along the shins. The voltaren gel helps with her knee pain and to a lesser extent. Dr. Marin Olp ordered dopplers of her legs which showed a superficial phlebitis in the left calf. She continues to have pain in her calves, left more than right with more swelling on the left as well.  I reviewed the most recent lumbar films from last year which show generalized spondylosis and ddd. She is having ongoing pain in her back with basic movement, standing, etc.  She states that [name] treats her depression.        Pain Inventory Average Pain 5 Pain Right Now 5 My pain is constant, sharp, burning, stabbing, tingling and aching  In the last 24 hours, has pain interfered with the following? General activity 5 Relation with others 6 Enjoyment of life 5 What TIME of day is your pain at its worst? morning, daytime and evening Sleep (in general) Poor  Pain is worse with: walking,  bending, standing and some activites Pain improves with: rest, medication and injections Relief from Meds: 4  Mobility use a walker how many minutes can you walk? 5 ability to climb steps?  no do you drive?  yes  Function retired  Neuro/Psych bladder control problems weakness numbness tingling trouble walking spasms depression anxiety  Prior Studies Any changes since last visit?  no  Physicians involved in your care Any changes since last visit?  no   Family History  Problem Relation Age of Onset  . Arthritis Mother   . Kidney disease Mother   . Heart disease Father   . Stroke Father   . Hypertension Father   . Diabetes Father    History   Social History  . Marital Status: Widowed    Spouse Name: N/A    Number of Children: N/A  . Years of Education: N/A   Social History Main Topics  . Smoking status: Never Smoker   . Smokeless tobacco: Never Used     Comment: never used tobacco  . Alcohol Use: No  . Drug Use: No  . Sexual Activity: Not Currently   Other Topics Concern  . None   Social History Narrative   Widowed, 2 sons.  Relocated to [place] 09/2012 to be closer to her son who has MS.   Husband d 2015--mesothelioma.   Occupation: former Pharmacist, hospital.   Education: masters degree level.   No T/A/Ds.   Past Surgical History  Procedure Laterality Date  . Appendectomy  1960  . Total abdominal hysterectomy  1974  . Tendon release  1996    Right forearm and hand  . Knee surgery  2005  . Heel spur surgery Left 2008  . Plantar fascia release Left 2008  . Axillary surgery Left 1978    Multiple "lump" in armpit per pt  . Coccyx removal  1972  . Cardiac catheterization  01/2013    nonobstructive CAD, EF 55-60%  . Transthoracic echocardiogram  01/2013; 04/2014    2014--NORMAL.  2015--focal basal septal hypertrophy, EF 55-60%, grade I diast dysfxn, mild LAE.    . Dilation and curettage of uterus  ? 1970's  . Eye surgery Left 2012-2013    "injections for ~ 1  yr; don't really know what for" (07/12/2013)  . Spirometry  04/25/14    In hosp for acute asthma/COPD flare: mixed obstructive and restrictive lung disease. The FEV1 is severely reduced at 45% predicted.  FEV1 signif decreased compared to prior spirometry 07/23/13.  [name] Kitchen Esophagogastroduodenoscopy N/A 07/19/2014    Gastritis found + in the setting of supratherapeutic INR, +plavix, + meloxicam.  . Left heart catheterization with coronary angiogram N/A 01/30/2013    Procedure: LEFT HEART CATHETERIZATION WITH CORONARY ANGIOGRAM;  Surgeon: Clent Demark, MD;  Location: [facility] CATH LAB;  Service: Cardiovascular;  Laterality: N/A;   Past Medical History  Diagnosis Date  . HTN (hypertension)   . Depression   . Recurrent UTI     +hx of hospitalization for pyelonephritis  . Hay fever   . Mixed incontinence urge and stress   . Diverticular disease   . Insomnia   . Fibromyalgia     Patient states dx was around her late 75s but she had sx's for years prior to this.  . Syncope     +Hypotensive; ED visit--Dr. Terrence Dupont did Cath--nonobstructive CAD, EF 55-60%.  In retrospect, suspect pt rx med misuse/polypharmacy  . Idiopathic angio-edema-urticaria 72014    Angioedema component was very minimal  . Asthma     + asbestososis   . History of pneumonia     hospitalized 12/2011, 02/2013, and 07/2013 [facility]) for this  . Hyperlipidemia   . Anginal pain     Nonobstructive CAD 2014  . OSA on CPAP     prior to move to [place]--had another sleep study 10/2015 w/pulm Dr. Camillo Flaming.  . Iron deficiency anemia     Hematologist in [place], [place] did extensive w iron sucrose /u; no cause found; failed oral supplement;; gets fairly regular (q34m or so) IV iron infusions (Venofer  200mg  with procrit.  "for 14 yr I've been getting blood work q month & getting infusions prn" (07/12/2013).  Dr. Marin Olp locally, iron infusions done, EPO deficiency dx'd  . H/O hiatal hernia   . Migraine syndrome     "not as often anymore; used to be ~ q wk"  (07/12/2013)  . Tension headache, chronic   . DDD (degenerative disc disease)     lumbar and cervical.   . Osteoarthritis     "severe; progressing fast" (07/12/2013)  . Chronic lower back pain   . Anxiety     panic attacks  . Nephrolithiasis     "passed all on my own or they are still in there" (07/12/2013)  . Pyelonephritis     "several times over the last 30 yr" (07/12/2013)  . Diastolic congestive heart failure   . COPD (chronic obstructive pulmonary disease)   . Pulmonary embolism 07/2013    Dx at [facility]  with very small peripheral upper lobe pe 07/2013: pt took coumadin for about 8-9 mo  . Pleural plaque with presence of asbestos 07/22/2013  . BPPV (benign paroxysmal positional vertigo) 12/16/2012  . RBBB (right bundle branch block)   . Pernicious anemia 08/24/2014  . Acute upper GI bleed 06/2014    while pt taking coumadin, plavix, and meloxicam---despite being told not to take coumadin.   BP 106/52 mmHg  Pulse 78  Resp 14  SpO2 96%  Opioid Risk Score:   Fall Risk Score: Low Fall Risk (0-5 points)   Review of Systems  HENT: Negative.   Eyes: Negative.   Respiratory: Positive for shortness of breath.   Cardiovascular: Negative.   Gastrointestinal: Negative.   Endocrine: Negative.   Genitourinary:       Bladder control problems  Musculoskeletal: Positive for myalgias, back pain, joint swelling and arthralgias.  Skin: Negative.   Allergic/Immunologic: Negative.   Neurological: Positive for weakness and numbness.       Tingling, trouble walking, spasms  Hematological: Negative.   Psychiatric/Behavioral: Positive for dysphoric mood. The patient is nervous/anxious.        Objective:   Physical Exam  Constitutional: She is oriented to person, place, and time. She appears well-developed and well-nourished.  No distress  HENT: oral mucosa pink, dentition good  Head: Normocephalic and atraumatic.  Eyes: Conjunctivae are normal. Pupils are equal, round, and reactive to light.    Neck: Normal range of motion. Neck supple.  Cardiovascular: Normal rate and regular rhythm. No murmurs  Respiratory: Effort normal and breath sounds normal. No respiratory distress. She has no wheezes.  GI: Soft. Bowel sounds are normal. She exhibits no distension. There is no tenderness.  Neurological: She is alert and oriented to person, place, and time. Mild sensory loss over right foot and ulnar hand is persistent---otherwise normal sensory exam. Strength grossly 4+/5 in bilateral UE's. LE's 4/5 HF and Knees, 4/5 ankles.. Normal rom and fmc. Cognitively displays good insight and awareness. Left 4th5th fingers with decreased LT/PP. Grip intact---may be slightly weaker on left compared to right. Skin: Skin is warm and dry.eczematous patches  M/s: pain in medial right knee. Mild crepitus. Some surrounding effsuion. mcmurray's + for medial joint line pain. Left knee also tender with pain upon palpation anteriorly and posteriorly. Edema 1+ LLE trace RLE. Left calf and tibial area tender. No warmth. Right elbow generally tender. Pain with palpation around the ulnar groove.  Psychiatric: She appears quite depressed.    Assessment/Plan:  1. Functional deficits secondary to Gait disorder due to multiple medical and psychosocial issues---most notably her GI blood loss and loss of husband.  2. DVT Prophylaxis/Anticoagulation: SCD's, ambulation  3. Back pain/fibromyalgia/R>L Knee OA Pain Management: Will change percocet To home dose Continue lidocaine patch, Lyrica ,  For knees- refilled voltaren gel apply TID to knees along with ice -may use heat to calf -knee sleeve for support  -compression stockings -percocet 10/325 one q 6 prn #90  -CSA was signed  -will discuss back further at next visit. Would need an MRI of her lumbar spine if we are to do any injections moving forward.  4. Right elbow/ulnar neuropathy-needs to see Dr. Rhona Raider regarding her ortho plan (interrupted by husband's death)---she  will contact him  -needs NCS RUE to localize ulnar nerve injury 4. Depression with anxiety/Grief reaction/Mood: Trazodone/cymbalta .  -this continues to be a major problem---her biggest problem IMO.  5. [name] with asthma: Oxygen dependent. Albuterol prn.   [place]  minutes of face to face patient care time were spent during this visit. All questions were encouraged and answered. Follow up in a month

## 2015-01-24 ENCOUNTER — Encounter: Payer: Self-pay | Admitting: Family

## 2015-01-24 ENCOUNTER — Ambulatory Visit (HOSPITAL_BASED_OUTPATIENT_CLINIC_OR_DEPARTMENT_OTHER)
Admission: RE | Admit: 2015-01-24 | Discharge: 2015-01-24 | Disposition: A | Discharge status: Home / Self Care | Payer: Medicare Other | Source: Ambulatory Visit | Attending: Family | Admitting: Family

## 2015-01-24 ENCOUNTER — Other Ambulatory Visit (HOSPITAL_BASED_OUTPATIENT_CLINIC_OR_DEPARTMENT_OTHER): Payer: Medicare Other | Admitting: Lab

## 2015-01-24 ENCOUNTER — Ambulatory Visit (HOSPITAL_BASED_OUTPATIENT_CLINIC_OR_DEPARTMENT_OTHER): Payer: Medicare Other

## 2015-01-24 ENCOUNTER — Ambulatory Visit (HOSPITAL_BASED_OUTPATIENT_CLINIC_OR_DEPARTMENT_OTHER): Payer: Medicare Other | Admitting: Family

## 2015-01-24 VITALS — BP 133/64 | HR 65 | Temp 98.1°F | Resp 14 | Ht 62.0 in | Wt 177.0 lb

## 2015-01-24 DIAGNOSIS — M7989 Other specified soft tissue disorders: Secondary | ICD-10-CM | POA: Diagnosis not present

## 2015-01-24 DIAGNOSIS — M609 Myositis, unspecified: Secondary | ICD-10-CM | POA: Diagnosis not present

## 2015-01-24 DIAGNOSIS — D509 Iron deficiency anemia, unspecified: Secondary | ICD-10-CM

## 2015-01-24 DIAGNOSIS — M79605 Pain in left leg: Secondary | ICD-10-CM | POA: Insufficient documentation

## 2015-01-24 DIAGNOSIS — M7122 Synovial cyst of popliteal space [Baker], left knee: Secondary | ICD-10-CM | POA: Diagnosis not present

## 2015-01-24 DIAGNOSIS — D510 Vitamin B12 deficiency anemia due to intrinsic factor deficiency: Secondary | ICD-10-CM | POA: Diagnosis not present

## 2015-01-24 DIAGNOSIS — M79604 Pain in right leg: Secondary | ICD-10-CM

## 2015-01-24 DIAGNOSIS — M79662 Pain in left lower leg: Secondary | ICD-10-CM | POA: Diagnosis not present

## 2015-01-24 DIAGNOSIS — M797 Fibromyalgia: Secondary | ICD-10-CM | POA: Diagnosis not present

## 2015-01-24 DIAGNOSIS — M79661 Pain in right lower leg: Secondary | ICD-10-CM | POA: Diagnosis not present

## 2015-01-24 DIAGNOSIS — I2699 Other pulmonary embolism without acute cor pulmonale: Secondary | ICD-10-CM

## 2015-01-24 LAB — CBC WITH DIFFERENTIAL (CANCER CENTER ONLY)
BASO#: 0 10*3/uL (ref 0.0–0.2)
BASO%: 0.8 % (ref 0.0–2.0)
EOS%: 1.2 % (ref 0.0–7.0)
Eosinophils Absolute: 0.1 10*3/uL (ref 0.0–0.5)
HCT: 38.7 % (ref 34.8–46.6)
HGB: 12.9 g/dL (ref 11.6–15.9)
LYMPH#: 1.6 10*3/uL (ref 0.9–3.3)
LYMPH%: 30.7 % (ref 14.0–48.0)
MCH: 30.6 pg (ref 26.0–34.0)
MCHC: 33.3 g/dL (ref 32.0–36.0)
MCV: 92 fL (ref 81–101)
MONO#: 0.5 10*3/uL (ref 0.1–0.9)
MONO%: 9.6 % (ref 0.0–13.0)
NEUT%: 57.7 % (ref 39.6–80.0)
NEUTROS ABS: 2.9 10*3/uL (ref 1.5–6.5)
PLATELETS: 182 10*3/uL (ref 145–400)
RBC: 4.21 10*6/uL (ref 3.70–5.32)
RDW: 12.5 % (ref 11.1–15.7)
WBC: 5.1 10*3/uL (ref 3.9–10.0)

## 2015-01-24 LAB — IRON AND TIBC CHCC
%SAT: 42 % (ref 21–57)
IRON: 94 ug/dL (ref 41–142)
TIBC: 225 ug/dL — AB (ref 236–444)
UIBC: 132 ug/dL (ref 120–384)

## 2015-01-24 LAB — CMP (CANCER CENTER ONLY)
ALBUMIN: 3.6 g/dL (ref 3.3–5.5)
ALT: 20 U/L (ref 10–47)
AST: 25 U/L (ref 11–38)
Alkaline Phosphatase: 74 U/L (ref 26–84)
BUN: 10 mg/dL (ref 7–22)
CALCIUM: 9 mg/dL (ref 8.0–10.3)
CHLORIDE: 103 meq/L (ref 98–108)
CO2: 28 meq/L (ref 18–33)
CREATININE: 0.9 mg/dL (ref 0.6–1.2)
GLUCOSE: 92 mg/dL (ref 73–118)
Potassium: 3.6 mEq/L (ref 3.3–4.7)
Sodium: 142 mEq/L (ref 128–145)
TOTAL PROTEIN: 6.9 g/dL (ref 6.4–8.1)
Total Bilirubin: 0.6 mg/dl (ref 0.20–1.60)

## 2015-01-24 LAB — FERRITIN CHCC: Ferritin: 1834 ng/ml — ABNORMAL HIGH (ref 9–269)

## 2015-01-24 MED ORDER — CYANOCOBALAMIN 1000 MCG/ML IJ SOLN
INTRAMUSCULAR | Status: AC
  Filled 2015-01-24: qty 1

## 2015-01-24 MED ORDER — CYANOCOBALAMIN 1000 MCG/ML IJ SOLN
1000.0000 ug | Freq: Once | INTRAMUSCULAR | Status: AC
  Administered 2015-01-24: 1000 ug via INTRAMUSCULAR

## 2015-01-24 NOTE — Patient Instructions (Signed)

## 2015-01-24 NOTE — Progress Notes (Signed)
[place]  Telephone:(336) [phone] Fax:(336) [phone]  ID: Linda [name] OB: May 14, 1946 MR#: [id number] PRF#:[id number] Patient Care Team: Tammi Sou, MD as PCP - General (Family Medicine) Volanda Napoleon, MD as Consulting Physician (Oncology) Tanda Rockers, MD as Consulting Physician (Pulmonary Disease) Campbell Lerner, MD as Consulting Physician (Rheumatology) Meredith Staggers, MD as Consulting Physician (Physical Medicine and Rehabilitation)  DIAGNOSIS: Idiopathic pulmonary embolism  [name] with chronic oxygen use  Recurrent iron deficiency anemia  Pernicious anemia  Congestive heart failure  INTERVAL HISTORY: Linda [name] is here today for a follow-up. She is having a hard time with pain in her legs and swelling. She has a difficult time walking. She has fluid on her knees that her orthopedist has drained off but keeps building back up again. She discussed having knee replacements with him but her heart and lung issues make her a poor candidate for surgery. This has her very discouraged. She is also missing her husband. She has fibromyalgia and chronic back problems. She has had no fever, chills, n/v, cough, rash, headache, dizziness, chest pain, palpitations, abdominal pain, diarrhea, blood in urine or stool. She takes Miralax to prevent constipation.  She is on 3 L of oxygen and has chronic lung issues from asbestos exposure. She has SOB with any exertion.  She has had no numbness or tingling in her extremities.  Her appetite is good and she is drinking lots of fluids. Her weight is stable at 177 lbs.   CURRENT TREATMENT: IV iron as indicated  Vitamin B12 1 mg IM every month   REVIEW OF SYSTEMS: All other 10 point review of systems is negative except for those issues mentioned above.   PAST MEDICAL HISTORY: Past Medical History  Diagnosis Date  . HTN (hypertension)   . Depression   . Recurrent UTI     +hx of hospitalization for pyelonephritis  .  Hay fever   . Mixed incontinence urge and stress   . Diverticular disease   . Insomnia   . Fibromyalgia     Patient states dx was around her late 53s but she had sx's for years prior to this.  . Syncope     +Hypotensive; ED visit--Dr. Terrence Dupont did Cath--nonobstructive CAD, EF 55-60%  . Idiopathic angio-edema-urticaria 72014    Angioedema component was very minimal  . 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 Asthma     + asbestososis   . History of pneumonia     hospitalized 12/2011, 02/2013, and 07/2013 [facility]) for this  . High cholesterol     "I'm on Lipitor cause it will help my heart in some way" (07/12/2013)  . Anginal pain     Nonobstructive CAD 2014  . OSA on CPAP   . Iron deficiency anemia     Hematologist in [place], [place] did extensive w/u; no cause found; failed oral supplement;; gets fairly regular (q31m or so) IV iron infusions (Venofer -iron sucrose, 200mg  with procrit.  "for 14 yr I've been getting blood work q month & getting infusions prn" (07/12/2013).  Dr. Marin Olp locally, iron infusions done, then erythropoietin deficiency diagnosed: aranesp to be started as of 09/2013.  . H/O hiatal hernia   . Migraine syndrome     "not as often anymore; used to be ~ q wk" (07/12/2013)  . Tension headache, chronic   . DDD (degenerative  disc disease)     lumbar and cervical.   . Osteoarthritis     "severe; progressing fast" (07/12/2013)  . Chronic lower back pain   . Anxiety     panic attacks  . Nephrolithiasis     "passed all on my own or they are still in there" (07/12/2013)  . Pyelonephritis     "several times over the last 30 yr" (07/12/2013)  . Diastolic congestive heart failure   . COPD (chronic obstructive pulmonary disease)   . Pulmonary embolism 07/2013    coumadin needed x 6 mo; Dx at [facility] with very small peripheral upper lobe pe 07/2013    . Pleural plaque with presence of asbestos  07/22/2013  . BPPV (benign paroxysmal positional vertigo) 12/16/2012  . RBBB (right bundle branch block)   . Pernicious anemia 08/24/2014   PAST SURGICAL HISTORY: Past Surgical History  Procedure Laterality Date  . Appendectomy  1960  . Total abdominal hysterectomy  1974  . Tendon release  1996    Right forearm and hand  . Knee surgery  2005  . Heel spur surgery Left 2008  . Plantar fascia release Left 2008  . Axillary surgery Left 1978    Multiple "lump" in armpit per pt  . Coccyx removal  1972  . Cardiac catheterization  01/2013    nonobstructive CAD, EF 55-60%  . Transthoracic echocardiogram  01/2013; 04/2014    2014--NORMAL.  2015--focal basal septal hypertrophy, EF 55-60%, grade I diast dysfxn, mild LAE.    . Dilation and curettage of uterus  ? 1970's  . Eye surgery Left 2012-2013    "injections for ~ 1 yr; don't really know what for" (07/12/2013)  . Spirometry  04/25/14    In hosp for acute asthma/COPD flare: mixed obstructive and restrictive lung disease. The FEV1 is severely reduced at 45% predicted.  FEV1 signif decreased compared to prior spirometry 07/23/13.  [name] Kitchen Esophagogastroduodenoscopy N/A 07/19/2014    Gastritis found + in the setting of supratherapeutic INR, +plavix, + meloxicam.  . Left heart catheterization with coronary angiogram N/A 01/30/2013    Procedure: LEFT HEART CATHETERIZATION WITH CORONARY ANGIOGRAM;  Surgeon: Clent Demark, MD;  Location: [facility] CATH LAB;  Service: Cardiovascular;  Laterality: N/A;   FAMILY HISTORY Family History  Problem Relation Age of Onset  . Arthritis Mother   . Kidney disease Mother   . Heart disease Father   . Stroke Father   . Hypertension Father   . Diabetes Father    GYNECOLOGIC HISTORY:  No LMP recorded. Patient has had a hysterectomy.   SOCIAL HISTORY:  History   Social History  . Marital Status: Widowed    Spouse Name: N/A    Number of Children: N/A  . Years of Education: N/A   Occupational History  . Not on file.    Social History Main Topics  . Smoking status: Never Smoker   . Smokeless tobacco: Never Used     Comment: never used tobacco  . Alcohol Use: No  . Drug Use: No  . Sexual Activity: Not Currently   Other Topics Concern  . Not on file   Social History Narrative   Widowed, 2 sons.  Relocated to [place] 09/2012 to be closer to her son who has MS.   Husband d 2015--mesothelioma.   Occupation: former Pharmacist, hospital.   Education: masters degree level.   No T/A/Ds.   ADVANCED DIRECTIVES: <no information>  HEALTH MAINTENANCE: History  Substance Use Topics  . Smoking status:  Never Smoker   . Smokeless tobacco: Never Used     Comment: never used tobacco  . Alcohol Use: No   Colonoscopy: PAP: Bone density: Lipid panel:  Allergies  Allergen Reactions  . Oxycodone     Son asked [place] not to prescribe narcotics for patient due to patient dependence.  See 08/26/14 note  . Penicillins Itching, Swelling and Rash    Tolerated Cefepime in ED.   Current Outpatient Prescriptions  Medication Sig Dispense Refill  . albuterol (PROVENTIL HFA;VENTOLIN HFA) 108 (90 BASE) MCG/ACT inhaler Inhale 2 puffs into the lungs every 6 (six) hours as needed for wheezing or shortness of breath. 1 Inhaler 1  . albuterol (PROVENTIL) (2.5 MG/3ML) 0.083% nebulizer solution Take 3 mLs (2.5 mg total) by nebulization every 6 (six) hours as needed for wheezing or shortness of breath. 75 mL 1  . ALPRAZolam (XANAX) 1 MG tablet Take 1 tablet (1 mg total) by mouth 3 (three) times daily. 90 tablet 5  . atorvastatin (LIPITOR) 20 MG tablet Take 1 tablet (20 mg total) by mouth daily at 6 PM. 30 tablet 6  . budesonide-formoterol (SYMBICORT) 160-4.5 MCG/ACT inhaler Inhale 2 puffs into the lungs 2 (two) times daily. 1 Inhaler 12  . Cholecalciferol (VITAMIN D3) 50000 UNITS CAPS Take 50,000 Units by mouth once a week. 12 capsule 0  . diclofenac sodium (VOLTAREN) 1 % GEL Apply 4 g topically 3 (three) times daily. TO BOTH KNEES 100 g 6  .  DULoxetine (CYMBALTA) 60 MG capsule Take 1 capsule (60 mg total) by mouth daily. 30 capsule 6  . folic acid (FOLVITE) 1 MG tablet Take 2 tablets (2 mg total) by mouth daily. 60 tablet 6  . isosorbide mononitrate (IMDUR) 30 MG 24 hr tablet Take 1 tablet (30 mg total) by mouth daily. 30 tablet 6  . MAGNESIUM CARBONATE PO Take 400 mg by mouth every morning.    . nebivolol (BYSTOLIC) 5 MG tablet Take 1 tablet (5 mg total) by mouth daily. 30 tablet 6  . ondansetron (ZOFRAN) 4 MG tablet Take 1 tablet (4 mg total) by mouth every 6 (six) hours. 12 tablet 0  . oxyCODONE-acetaminophen (PERCOCET) 10-325 MG per tablet Take 1 tablet by mouth every 6 (six) hours as needed for pain. ONE MONTH SUPPLY 100 tablet 0  . pantoprazole (PROTONIX) 40 MG tablet Take 1 tablet (40 mg total) by mouth daily. 30 tablet 6  . polyethylene glycol powder (GLYCOLAX/MIRALAX) powder Take 17 g by mouth daily. 3350 g 6  . potassium chloride SA (K-DUR,KLOR-CON) 20 MEQ tablet Take 2 tablets (40 mEq total) by mouth once. 60 tablet 6  . pregabalin (LYRICA) 300 MG capsule Take 1 capsule twice a day 60 capsule 5  . traZODone (DESYREL) 50 MG tablet Take 3 tablets (150 mg total) by mouth at bedtime. 90 tablet 6   No current facility-administered medications for this visit.   Facility-Administered Medications Ordered in Other Visits  Medication Dose Route Frequency Provider Last Rate Last Dose  . ferumoxytol (FERAHEME) 510 mg in sodium chloride 0.9 % 100 mL IVPB  510 mg Intravenous Once Volanda Napoleon, MD       OBJECTIVE: There were no vitals filed for this visit. There is no weight on file to calculate BMI. ECOG FS:1 - Symptomatic but completely ambulatory Ocular: Sclerae unicteric, pupils equal, round and reactive to light Ear-nose-throat: Oropharynx clear, dentition fair Lymphatic: No cervical or supraclavicular adenopathy Lungs no rales or rhonchi, good excursion bilaterally Heart regular  rate and rhythm, no murmur appreciated Abd  soft, nontender, positive bowel sounds MSK no focal spinal tenderness, no joint edema Neuro: non-focal, well-oriented, appropriate affect Breasts: Deferred  LAB RESULTS: CMP     Component Value Date/Time   NA 142 01/24/2015 1041   NA 143 10/24/2014 1148   K 3.6 01/24/2015 1041   K 4.1 10/24/2014 1148   CL 103 01/24/2015 1041   CL 106 10/24/2014 1148   CO2 28 01/24/2015 1041   CO2 29 10/24/2014 1148   GLUCOSE 92 01/24/2015 1041   GLUCOSE 101* 10/24/2014 1148   BUN 10 01/24/2015 1041   BUN 10 10/24/2014 1148   CREATININE 0.9 01/24/2015 1041   CREATININE 0.89 10/24/2014 1148   CALCIUM 9.0 01/24/2015 1041   CALCIUM 9.1 10/24/2014 1148   PROT 6.9 01/24/2015 1041   PROT 6.5 10/24/2014 1148   ALBUMIN 4.0 10/24/2014 1148   AST 25 01/24/2015 1041   AST 15 10/24/2014 1148   ALT 20 01/24/2015 1041   ALT 11 10/24/2014 1148   ALKPHOS 74 01/24/2015 1041   ALKPHOS 90 10/24/2014 1148   BILITOT 0.60 01/24/2015 1041   BILITOT 0.3 10/24/2014 1148   GFRNONAA 87* 09/15/2014 1454   GFRAA >90 09/15/2014 1454   No results found for: SPEP Lab Results  Component Value Date   WBC 5.1 01/24/2015   NEUTROABS 2.9 01/24/2015   HGB 12.9 01/24/2015   HCT 38.7 01/24/2015   MCV 92 01/24/2015   PLT 182 01/24/2015   No results found for: LABCA2 No components found for: LABCA125 No results for input(s): INR in the last 168 hours. STUDIES: No results found.  ASSESSMENT/PLAN: Ms. Slezak is 69 year old female with multiple medical problems. She has iron deficiency anemia. She is symptomatic with fatigue.  Her CBC and CMP both look good today. We will see what her iron studies show.  We will give her a vitamin B 12 injection today.  We will also get an ultrasound of both of her legs to rule out blood clots.  We will see her back in 3 months for labs and follow-up.  She is in agreement with this and knows to call here with any questions or concerns. We can certainly see her back sooner if need be.    Eliezer Bottom, NP 01/24/2015 11:26 AM

## 2015-01-25 LAB — VITAMIN B12: Vitamin B-12: 538 pg/mL (ref 211–911)

## 2015-01-31 ENCOUNTER — Other Ambulatory Visit: Payer: Self-pay | Admitting: Family

## 2015-02-06 ENCOUNTER — Telehealth: Payer: Self-pay | Admitting: *Deleted

## 2015-02-06 NOTE — Telephone Encounter (Signed)
Requesting rx for pt, oxycodone

## 2015-02-06 NOTE — Telephone Encounter (Signed)
Called nurse back informed that pt's Rx is 100 tablets for a 1 month supply and that the medication needs to be managed to last the full 30 days and is not necessarily meant to be taken exactly every 6 hours. It is written to be taken as needed for pain. I also informed the nurse that the pt has an upcoming appt on 02/13/2015 with Danella Sensing

## 2015-02-13 ENCOUNTER — Encounter: Payer: Self-pay | Admitting: Registered Nurse

## 2015-02-13 ENCOUNTER — Other Ambulatory Visit (HOSPITAL_COMMUNITY): Payer: Self-pay | Admitting: Cardiology

## 2015-02-13 ENCOUNTER — Encounter: Payer: Medicare Other | Attending: Physical Medicine & Rehabilitation | Admitting: Registered Nurse

## 2015-02-13 ENCOUNTER — Other Ambulatory Visit: Payer: Self-pay | Admitting: Registered Nurse

## 2015-02-13 VITALS — BP 118/69 | HR 73 | Resp 16

## 2015-02-13 DIAGNOSIS — I2510 Atherosclerotic heart disease of native coronary artery without angina pectoris: Secondary | ICD-10-CM | POA: Diagnosis not present

## 2015-02-13 DIAGNOSIS — G4733 Obstructive sleep apnea (adult) (pediatric): Secondary | ICD-10-CM | POA: Diagnosis not present

## 2015-02-13 DIAGNOSIS — E785 Hyperlipidemia, unspecified: Secondary | ICD-10-CM | POA: Diagnosis not present

## 2015-02-13 DIAGNOSIS — Z79899 Other long term (current) drug therapy: Secondary | ICD-10-CM

## 2015-02-13 DIAGNOSIS — F418 Other specified anxiety disorders: Secondary | ICD-10-CM

## 2015-02-13 DIAGNOSIS — M5440 Lumbago with sciatica, unspecified side: Secondary | ICD-10-CM | POA: Diagnosis not present

## 2015-02-13 DIAGNOSIS — N390 Urinary tract infection, site not specified: Secondary | ICD-10-CM

## 2015-02-13 DIAGNOSIS — M1990 Unspecified osteoarthritis, unspecified site: Secondary | ICD-10-CM | POA: Diagnosis not present

## 2015-02-13 DIAGNOSIS — Z5181 Encounter for therapeutic drug level monitoring: Secondary | ICD-10-CM | POA: Diagnosis not present

## 2015-02-13 DIAGNOSIS — Z87440 Personal history of urinary (tract) infections: Secondary | ICD-10-CM | POA: Diagnosis not present

## 2015-02-13 DIAGNOSIS — G894 Chronic pain syndrome: Secondary | ICD-10-CM | POA: Diagnosis not present

## 2015-02-13 DIAGNOSIS — E119 Type 2 diabetes mellitus without complications: Secondary | ICD-10-CM | POA: Diagnosis not present

## 2015-02-13 DIAGNOSIS — M797 Fibromyalgia: Secondary | ICD-10-CM | POA: Diagnosis not present

## 2015-02-13 DIAGNOSIS — F32A Depression, unspecified: Secondary | ICD-10-CM

## 2015-02-13 DIAGNOSIS — I10 Essential (primary) hypertension: Secondary | ICD-10-CM | POA: Diagnosis not present

## 2015-02-13 DIAGNOSIS — I209 Angina pectoris, unspecified: Secondary | ICD-10-CM | POA: Diagnosis not present

## 2015-02-13 DIAGNOSIS — R2689 Other abnormalities of gait and mobility: Secondary | ICD-10-CM | POA: Insufficient documentation

## 2015-02-13 DIAGNOSIS — K219 Gastro-esophageal reflux disease without esophagitis: Secondary | ICD-10-CM | POA: Diagnosis not present

## 2015-02-13 DIAGNOSIS — M1712 Unilateral primary osteoarthritis, left knee: Secondary | ICD-10-CM

## 2015-02-13 DIAGNOSIS — M1711 Unilateral primary osteoarthritis, right knee: Secondary | ICD-10-CM

## 2015-02-13 DIAGNOSIS — F329 Major depressive disorder, single episode, unspecified: Secondary | ICD-10-CM

## 2015-02-13 DIAGNOSIS — R079 Chest pain, unspecified: Secondary | ICD-10-CM

## 2015-02-13 DIAGNOSIS — D649 Anemia, unspecified: Secondary | ICD-10-CM | POA: Diagnosis not present

## 2015-02-13 DIAGNOSIS — J45909 Unspecified asthma, uncomplicated: Secondary | ICD-10-CM | POA: Diagnosis not present

## 2015-02-13 DIAGNOSIS — E559 Vitamin D deficiency, unspecified: Secondary | ICD-10-CM | POA: Diagnosis not present

## 2015-02-13 DIAGNOSIS — F419 Anxiety disorder, unspecified: Secondary | ICD-10-CM

## 2015-02-13 MED ORDER — OXYCODONE-ACETAMINOPHEN 10-325 MG PO TABS: 1.0000 | ORAL_TABLET | Freq: Four times a day (QID) | ORAL | Status: DC | PRN

## 2015-02-13 NOTE — Progress Notes (Signed)
Subjective:    Patient ID: Linda [name], female    DOB: 12-Oct-1946, 69 y.o.   MRN: [id number]  HPI: [name] is a 69 year old female who returns for follow up for chronic pain and medication refill. She says her pain is located in her lower back radiating into her bilateral lower extremities.and knee pain She rates her pain 6. Her current exercise regime is walking. She's schedule for cortisone injection with Dr. Elmyra Ricks on March 4th, 2016.  Pain Inventory Average Pain 8 Pain Right Now 6 My pain is sharp, burning, stabbing and aching  In the last 24 hours, has pain interfered with the following? General activity 8 Relation with others 7 Enjoyment of life 9 What TIME of day is your pain at its worst? daytime and evening Sleep (in general) Fair  Pain is worse with: walking, bending, standing and some activites Pain improves with: rest, medication and injections Relief from Meds: 7  Mobility use a walker how many minutes can you walk? 5 ability to climb steps?  no do you drive?  yes  Function disabled: date disabled 2002  Neuro/Psych bladder control problems weakness numbness tingling trouble walking spasms depression anxiety  Prior Studies Any changes since last visit?  no  Physicians involved in your care Any changes since last visit?  no   Family History  Problem Relation Age of Onset  . Arthritis Mother   . Kidney disease Mother   . Heart disease Father   . Stroke Father   . Hypertension Father   . Diabetes Father    History   Social History  . Marital Status: Widowed    Spouse Name: N/A  . Number of Children: N/A  . Years of Education: N/A   Social History Main Topics  . Smoking status: Never Smoker   . Smokeless tobacco: Never Used     Comment: never used tobacco  . Alcohol Use: No  . Drug Use: No  . Sexual Activity: Not Currently   Other Topics Concern  . None   Social History Narrative   Widowed, 2 sons.   Relocated to [place] 09/2012 to be closer to her son who has MS.   Husband d 2015--mesothelioma.   Occupation: former Pharmacist, hospital.   Education: masters degree level.   No T/A/Ds.   Past Surgical History  Procedure Laterality Date  . Appendectomy  1960  . Total abdominal hysterectomy  1974  . Tendon release  1996    Right forearm and hand  . Knee surgery  2005  . Heel spur surgery Left 2008  . Plantar fascia release Left 2008  . Axillary surgery Left 1978    Multiple "lump" in armpit per pt  . Coccyx removal  1972  . Cardiac catheterization  01/2013    nonobstructive CAD, EF 55-60%  . Transthoracic echocardiogram  01/2013; 04/2014    2014--NORMAL.  2015--focal basal septal hypertrophy, EF 55-60%, grade I diast dysfxn, mild LAE.    . Dilation and curettage of uterus  ? 1970's  . Eye surgery Left 2012-2013    "injections for ~ 1 yr; don't really know what for" (07/12/2013)  . Spirometry  04/25/14    In hosp for acute asthma/COPD flare: mixed obstructive and restrictive lung disease. The FEV1 is severely reduced at 45% predicted.  FEV1 signif decreased compared to prior spirometry 07/23/13.  [name] Kitchen Esophagogastroduodenoscopy N/A 07/19/2014    Gastritis found + in the setting of supratherapeutic INR, +plavix, + meloxicam.  .  Left heart catheterization with coronary angiogram N/A 01/30/2013    Procedure: LEFT HEART CATHETERIZATION WITH CORONARY ANGIOGRAM;  Surgeon: Clent Demark, MD;  Location: [facility] CATH LAB;  Service: Cardiovascular;  Laterality: N/A;   Past Medical History  Diagnosis Date  . HTN (hypertension)   . Depression   . Recurrent UTI     +hx of hospitalization for pyelonephritis  . Hay fever   . Mixed incontinence urge and stress   . Diverticular disease   . Insomnia   . Fibromyalgia     Patient states dx was around her late 32s but she had sx's for years prior to this.  . Syncope     +Hypotensive; ED visit--Dr. Terrence Dupont did Cath--nonobstructive CAD, EF 55-60%.  In retrospect, suspect pt rx  med misuse/polypharmacy  . Idiopathic angio-edema-urticaria 72014    Angioedema component was very minimal  . Asthma     + asbestososis   . History of pneumonia     hospitalized 12/2011, 02/2013, and 07/2013 [facility]) for this  . Hyperlipidemia   . Anginal pain     Nonobstructive CAD 2014  . OSA on CPAP     prior to move to [place]--had another sleep study 10/2015 w/pulm Dr. Camillo Flaming.  . Iron deficiency anemia     Hematologist in [place], [place] did extensive w/u; no cause found; failed oral supplement;; gets fairly regular (q39m or so) IV iron infusions (venofer/iron) 200mg  with procrit.  "for 14 yr I've been getting blood work q month & getting infusions prn" (07/12/2013).  Dr. Marin Olp locally, iron infusions done, EPO deficiency dx'd  . H/O hiatal hernia   . Migraine syndrome     "not as often anymore; used to be ~ q wk" (07/12/2013)  . Tension headache, chronic   . DDD (degenerative disc disease)     lumbar and cervical.   . Osteoarthritis     "severe; progressing fast" (07/12/2013)  . Chronic lower back pain   . Anxiety     panic attacks  . Nephrolithiasis     "passed all on my own or they are still in there" (07/12/2013)  . Pyelonephritis     "several times over the last 30 yr" (07/12/2013)  . Diastolic congestive heart failure   . COPD (chronic obstructive pulmonary disease)   . Pulmonary embolism 07/2013    Dx at [facility] with very small peripheral upper lobe pe 07/2013: pt took coumadin for about 8-9 mo  . Pleural plaque with presence of asbestos 07/22/2013  . BPPV (benign paroxysmal positional vertigo) 12/16/2012  . RBBB (right bundle branch block)   . Pernicious anemia 08/24/2014  . Acute upper GI bleed 06/2014    while pt taking coumadin, plavix, and meloxicam---despite being told not to take coumadin.   BP 118/69 P 73 R 16 O2 sat 94% on 3L [place]  Opioid Risk Score:   Fall Risk Score: Moderate Fall Risk (6-13 points) (educated and given handout) Review of Systems  Constitutional: Positive for  unexpected weight change.  Respiratory: Positive for shortness of breath.   Cardiovascular: Positive for leg swelling.  Gastrointestinal: Positive for nausea.  Genitourinary: Positive for dysuria.       Bladder control problems  Musculoskeletal: Positive for gait problem.       Spasms  Neurological: Positive for weakness and numbness.       Tingling  Psychiatric/Behavioral: Positive for dysphoric mood. The patient is nervous/anxious.   All other systems reviewed and are negative.  Objective:   Physical Exam  Constitutional: She is oriented to person, place, and time. She appears well-developed and well-nourished.  HENT:  Head: Normocephalic and atraumatic.  Neck: Normal range of motion. Neck supple.  Cervical Paraspinal Tenderness: C-3- C-5  Cardiovascular: Normal rate and regular rhythm.   Pulmonary/Chest: Effort normal and breath sounds normal.  Continuous oxygen at 3 liters nasal cannula  Musculoskeletal:  Normal Muscle Bulk and Muscle Testing Reveals: Upper Extremities: Full ROM and Muscle Strength  On the right 4/5 and left 3/5 Thoracic and Lumbar Hypersensitivity Lower extremities: Full ROM and Muscle Strength 5/5 Bilateral Lower Extremities Flexion Produces Pain into Patella's Arises from chair with ease/ using cadillac walker for support.  Neurological: She is alert and oriented to person, place, and time.  Skin: Skin is warm and dry.  Psychiatric: She has a normal mood and affect.  Nursing note and vitals reviewed.         Assessment & Plan:  1. Functional deficits secondary to Gait disorder: Continue eith HEP. 2. Chronic Back pain/fibromyalgia/R>L Knee OA Pain Management:  Refilled: Oxycodone 10/325 one tablet every 6 hours #100  Continue Lyrica and Voltaren Gel  3. Depression with anxiety/Grief reaction/Mood: Continue Trazodone and Cymbalta .  4. [name] with asthma: Oxygen dependent. Albuterol prn.  On Continuous Oxygen Therapy 3 liters/  Pulmonology Following.  30 minutes of face to face patient care time was spent during this visit. All questions were encouraged and answered.

## 2015-02-14 LAB — PMP ALCOHOL METABOLITE (ETG)

## 2015-02-16 LAB — URINE CULTURE: Colony Count: >=100000

## 2015-02-19 LAB — BENZODIAZEPINES (GC/LC/MS), URINE
Alprazolam metabolite (GC/LC/MS), ur confirm: 1747 ng/mL — AB (ref <25)
Clonazepam metabolite (GC/LC/MS), ur confirm: NEGATIVE ng/mL (ref <25)
Flurazepam metabolite (GC/LC/MS), ur confirm: NEGATIVE ng/mL (ref <50)
Lorazepam (GC/LC/MS), ur confirm: NEGATIVE ng/mL (ref <50)
Midazolam (GC/LC/MS), ur confirm: NEGATIVE ng/mL (ref <50)
Nordiazepam (GC/LC/MS), ur confirm: NEGATIVE ng/mL (ref <50)
Oxazepam (GC/LC/MS), ur confirm: NEGATIVE ng/mL (ref <50)
Temazepam (GC/LC/MS), ur confirm: NEGATIVE ng/mL (ref <50)
Triazolam metabolite (GC/LC/MS), ur confirm: NEGATIVE ng/mL (ref <50)

## 2015-02-19 LAB — OXYCODONE, URINE (LC/MS-MS)
Noroxycodone, Ur: 6725 ng/mL (ref <50)
Oxycodone, ur: 7493 ng/mL (ref <50)
Oxymorphone: 4060 ng/mL (ref <50)

## 2015-02-19 LAB — OPIATES/OPIOIDS (LC/MS-MS)
Codeine Urine: NEGATIVE ng/mL (ref <50)
Hydrocodone: NEGATIVE ng/mL (ref <50)
Hydromorphone: NEGATIVE ng/mL (ref <50)
Morphine Urine: NEGATIVE ng/mL (ref <50)
Norhydrocodone, Ur: NEGATIVE ng/mL (ref <50)
Noroxycodone, Ur: 6725 ng/mL (ref <50)
Oxycodone, ur: 7493 ng/mL (ref <50)
Oxymorphone: 4060 ng/mL (ref <50)

## 2015-02-19 LAB — AMPHETAMINES (GC/LC/MS), URINE
Amphetamine GC/MS Conf: NEGATIVE ng/mL (ref <250)
Methamphetamine Quant, Ur: NEGATIVE ng/mL (ref <250)

## 2015-02-19 LAB — ETHYL GLUCURONIDE, URINE
Ethyl Glucuronide (EtG): NEGATIVE ng/mL (ref <500)
Ethyl Sulfate (ETS): NEGATIVE ng/mL (ref <100)

## 2015-02-20 ENCOUNTER — Telehealth: Payer: Self-pay | Admitting: *Deleted

## 2015-02-20 NOTE — Telephone Encounter (Signed)
Calling about her uti and an antibiotic. There is not a urine culture or UA in the results.  The order was place but specimen never received.  I told Linda [name] that this is an issue that needs to be dealt with at her PCP and not the pain clinic.  She will have to go see her PCP for an antibiotic,

## 2015-02-21 LAB — PRESCRIPTION MONITORING PROFILE (SOLSTAS)
BARBITURATE SCREEN, URINE: NEGATIVE ng/mL
Buprenorphine, Urine: NEGATIVE ng/mL
COCAINE METABOLITES: NEGATIVE ng/mL
Cannabinoid Scrn, Ur: NEGATIVE ng/mL
Carisoprodol, Urine: NEGATIVE ng/mL
Creatinine, Urine: 137.75 mg/dL (ref >20.0)
Fentanyl, Ur: NEGATIVE ng/mL
MDMA URINE: NEGATIVE ng/mL
Meperidine, Ur: NEGATIVE ng/mL
Methadone Screen, Urine: NEGATIVE ng/mL
NITRITES URINE, INITIAL: NEGATIVE ug/mL
PH URINE, INITIAL: 6.1 pH (ref 4.5–8.9)
PROPOXYPHENE: NEGATIVE ng/mL
Tapentadol, urine: NEGATIVE ng/mL
Tramadol Scrn, Ur: NEGATIVE ng/mL
Zolpidem, Urine: NEGATIVE ng/mL

## 2015-02-22 ENCOUNTER — Encounter (HOSPITAL_COMMUNITY)
Admission: RE | Admit: 2015-02-22 | Discharge: 2015-02-22 | Disposition: A | Discharge status: Home / Self Care | Payer: Medicare Other | Source: Ambulatory Visit | Attending: Cardiology | Admitting: Cardiology

## 2015-02-22 ENCOUNTER — Other Ambulatory Visit: Payer: Self-pay

## 2015-02-22 DIAGNOSIS — I509 Heart failure, unspecified: Secondary | ICD-10-CM | POA: Diagnosis not present

## 2015-02-22 DIAGNOSIS — I209 Angina pectoris, unspecified: Secondary | ICD-10-CM | POA: Diagnosis not present

## 2015-02-22 DIAGNOSIS — R55 Syncope and collapse: Secondary | ICD-10-CM | POA: Diagnosis not present

## 2015-02-22 DIAGNOSIS — I10 Essential (primary) hypertension: Secondary | ICD-10-CM | POA: Diagnosis not present

## 2015-02-22 DIAGNOSIS — I110 Hypertensive heart disease with heart failure: Secondary | ICD-10-CM | POA: Diagnosis not present

## 2015-02-22 DIAGNOSIS — E119 Type 2 diabetes mellitus without complications: Secondary | ICD-10-CM | POA: Diagnosis not present

## 2015-02-22 DIAGNOSIS — J45909 Unspecified asthma, uncomplicated: Secondary | ICD-10-CM | POA: Diagnosis not present

## 2015-02-22 DIAGNOSIS — I2510 Atherosclerotic heart disease of native coronary artery without angina pectoris: Secondary | ICD-10-CM | POA: Diagnosis not present

## 2015-02-22 DIAGNOSIS — R079 Chest pain, unspecified: Secondary | ICD-10-CM

## 2015-02-22 HISTORY — PX: CARDIOVASCULAR STRESS TEST: SHX262

## 2015-02-22 MED ORDER — NITROGLYCERIN 0.4 MG SL SUBL
SUBLINGUAL_TABLET | SUBLINGUAL | Status: AC
  Filled 2015-02-22: qty 1

## 2015-02-22 MED ORDER — REGADENOSON 0.4 MG/5ML IV SOLN
INTRAVENOUS | Status: AC
  Administered 2015-02-22: 0.4 mg
  Filled 2015-02-22: qty 5

## 2015-02-22 MED ORDER — NITROGLYCERIN 0.4 MG SL SUBL
SUBLINGUAL_TABLET | SUBLINGUAL | Status: AC
  Administered 2015-02-22: 0.4 mg
  Filled 2015-02-22: qty 1

## 2015-02-22 MED ORDER — NITROGLYCERIN 0.4 MG SL SUBL: 0.4000 mg | SUBLINGUAL_TABLET | SUBLINGUAL | Status: DC | PRN

## 2015-02-22 MED ORDER — NITROGLYCERIN 0.4 MG SL SUBL
0.4000 mg | SUBLINGUAL_TABLET | SUBLINGUAL | Status: DC | PRN
  Administered 2015-02-22: 0.4 mg via SUBLINGUAL

## 2015-02-22 MED ORDER — REGADENOSON 0.4 MG/5ML IV SOLN: 0.4000 mg | Freq: Once | INTRAVENOUS | Status: DC

## 2015-02-22 MED ORDER — TECHNETIUM TC 99M SESTAMIBI GENERIC - CARDIOLITE
10.0000 | Freq: Once | INTRAVENOUS | Status: AC | PRN
  Administered 2015-02-22: 10 via INTRAVENOUS

## 2015-02-22 MED ORDER — TECHNETIUM TC 99M SESTAMIBI GENERIC - CARDIOLITE
30.0000 | Freq: Once | INTRAVENOUS | Status: AC | PRN
  Administered 2015-02-22: 30 via INTRAVENOUS

## 2015-02-22 NOTE — Progress Notes (Signed)
Ntg  SL given during lexi per order, continues to c/o chest discomfort and tightness, Dr Terrence Dupont notified will repeat Ntg SL and have pt take home meds

## 2015-02-24 DIAGNOSIS — M1711 Unilateral primary osteoarthritis, right knee: Secondary | ICD-10-CM | POA: Diagnosis not present

## 2015-02-24 DIAGNOSIS — M1712 Unilateral primary osteoarthritis, left knee: Secondary | ICD-10-CM | POA: Diagnosis not present

## 2015-03-03 DIAGNOSIS — M1712 Unilateral primary osteoarthritis, left knee: Secondary | ICD-10-CM | POA: Diagnosis not present

## 2015-03-03 DIAGNOSIS — M1711 Unilateral primary osteoarthritis, right knee: Secondary | ICD-10-CM | POA: Diagnosis not present

## 2015-03-10 DIAGNOSIS — M1711 Unilateral primary osteoarthritis, right knee: Secondary | ICD-10-CM | POA: Diagnosis not present

## 2015-03-10 DIAGNOSIS — M1712 Unilateral primary osteoarthritis, left knee: Secondary | ICD-10-CM | POA: Diagnosis not present

## 2015-03-10 NOTE — Progress Notes (Signed)
Urine drug screen for this encounter is consistent for prescribed medication 

## 2015-03-15 ENCOUNTER — Ambulatory Visit: Payer: Medicare Other | Admitting: Family Medicine

## 2015-03-20 ENCOUNTER — Encounter: Payer: Self-pay | Admitting: Family Medicine

## 2015-03-20 ENCOUNTER — Ambulatory Visit (INDEPENDENT_AMBULATORY_CARE_PROVIDER_SITE_OTHER): Payer: Medicare Other | Admitting: Family Medicine

## 2015-03-20 VITALS — BP 108/70 | HR 74 | Temp 97.9°F | Ht 63.0 in | Wt 172.0 lb

## 2015-03-20 DIAGNOSIS — F332 Major depressive disorder, recurrent severe without psychotic features: Secondary | ICD-10-CM | POA: Diagnosis not present

## 2015-03-20 DIAGNOSIS — G4700 Insomnia, unspecified: Secondary | ICD-10-CM | POA: Diagnosis not present

## 2015-03-20 DIAGNOSIS — F411 Generalized anxiety disorder: Secondary | ICD-10-CM | POA: Diagnosis not present

## 2015-03-20 DIAGNOSIS — N3001 Acute cystitis with hematuria: Secondary | ICD-10-CM

## 2015-03-20 DIAGNOSIS — N3000 Acute cystitis without hematuria: Secondary | ICD-10-CM | POA: Diagnosis not present

## 2015-03-20 LAB — POCT URINALYSIS DIPSTICK
BILIRUBIN UA: NEGATIVE
Glucose, UA: NEGATIVE
KETONES UA: NEGATIVE
Nitrite, UA: POSITIVE
PH UA: 5.5
Protein, UA: NEGATIVE
Spec Grav, UA: 1.015
Urobilinogen, UA: 0.2

## 2015-03-20 MED ORDER — CIPROFLOXACIN HCL 500 MG PO TABS: 500.0000 mg | ORAL_TABLET | Freq: Two times a day (BID) | ORAL | Status: DC

## 2015-03-20 MED ORDER — DULOXETINE HCL 60 MG PO CPEP
ORAL_CAPSULE | ORAL | Status: DC
Start: 2015-03-20 — End: 2015-05-03

## 2015-03-20 MED ORDER — DULOXETINE HCL 30 MG PO CPEP: ORAL_CAPSULE | ORAL | Status: DC

## 2015-03-20 NOTE — Progress Notes (Signed)
Pre visit review using our clinic review tool, if applicable. No additional management support is needed unless otherwise documented below in the visit note. 

## 2015-03-20 NOTE — Progress Notes (Signed)
OFFICE VISIT  03/20/2015   CC:  Chief Complaint  Patient presents with  . Follow-up   HPI:    Patient is a 69 y.o. Caucasian female who presents for 4 mo f/u chronic resp failure requiring 24/7 oxygen supplementation, insomnia, chronic anxiety and depression. She sees a cardiologist and pulmonologist as well as a hematologist and a pain mgmt specialist.  Also, has been getting some knee injections (synvisc)  lately for knee pains but these have not helped.  Dr. Ricki Rodriguez told her she is not a candidate for TKR.    She currently lives at [facility] but she still owns her home.   Says depression and anxiety are still quite a problem, has only "parts of good days".  Today is the anniversary of her husband's death.  Has been attending group grief counseling [facility].  Sleep initiation is impaired 4 out of 7 days a week.  Says "I haven't cried or grieved b/c I am trying to stay strong for my family".  Reports a pulling sensation in groin/pelvis when she urinates and has chills when this occurs--has been going on a couple of months, says urine smells.  No known fevers.  Urgency/frequency also the last couple of months.  Past medical, surgical, social, and family history reviewed and changes are noted since last office visit.   Outpatient Prescriptions Prior to Visit  Medication Sig Dispense Refill  . albuterol (PROVENTIL HFA;VENTOLIN HFA) 108 (90 BASE) MCG/ACT inhaler Inhale 2 puffs into the lungs every 6 (six) hours as needed for wheezing or shortness of breath. 1 Inhaler 1  . albuterol (PROVENTIL) (2.5 MG/3ML) 0.083% nebulizer solution Take 3 mLs (2.5 mg total) by nebulization every 6 (six) hours as needed for wheezing or shortness of breath. 75 mL 1  . ALPRAZolam (XANAX) 1 MG tablet Take 1 tablet (1 mg total) by mouth 3 (three) times daily. 90 tablet 5  . atorvastatin (LIPITOR) 20 MG tablet Take 1 tablet (20 mg total) by mouth daily at 6 PM. 30 tablet 6   . budesonide-formoterol (SYMBICORT) 160-4.5 MCG/ACT inhaler Inhale 2 puffs into the lungs 2 (two) times daily. 1 Inhaler 12  . Cholecalciferol (VITAMIN D3) 50000 UNITS CAPS Take 50,000 Units by mouth once a week. 12 capsule 0  . diclofenac sodium (VOLTAREN) 1 % GEL Apply 4 g topically 3 (three) times daily. TO BOTH KNEES 195 g 6  . folic acid (FOLVITE) 1 MG tablet Take 2 tablets (2 mg total) by mouth daily. 60 tablet 6  . isosorbide mononitrate (IMDUR) 30 MG 24 hr tablet Take 1 tablet (30 mg total) by mouth daily. 30 tablet 6  . nebivolol (BYSTOLIC) 5 MG tablet Take 1 tablet (5 mg total) by mouth daily. 30 tablet 6  . ondansetron (ZOFRAN) 4 MG tablet Take 1 tablet (4 mg total) by mouth every 6 (six) hours. 12 tablet 0  . oxyCODONE-acetaminophen (PERCOCET) 10-325 MG per tablet Take 1 tablet by mouth every 6 (six) hours as needed for pain. ONE MONTH SUPPLY 100 tablet 0  . pantoprazole (PROTONIX) 40 MG tablet Take 1 tablet (40 mg total) by mouth daily. 30 tablet 6  . polyethylene glycol powder (GLYCOLAX/MIRALAX) powder Take 17 g by mouth daily. 3350 g 6  . pregabalin (LYRICA) 300 MG capsule Take 1 capsule twice a day 60 capsule 5  . traZODone (DESYREL) 50 MG tablet Take 3 tablets (150 mg total) by mouth at bedtime. 90 tablet 6  . DULoxetine (CYMBALTA)  60 MG capsule Take 1 capsule (60 mg total) by mouth daily. 30 capsule 6  . MAGNESIUM CARBONATE PO Take 400 mg by mouth every morning.    . potassium chloride SA (K-DUR,KLOR-CON) 20 MEQ tablet Take 2 tablets (40 mEq total) by mouth once. (Patient not taking: Reported on 03/20/2015) 60 tablet 6   Facility-Administered Medications Prior to Visit  Medication Dose Route Frequency Provider Last Rate Last Dose  . ferumoxytol (FERAHEME) 510 mg in sodium chloride 0.9 % 100 mL IVPB  510 mg Intravenous Once Volanda Napoleon, MD        Allergies  Allergen Reactions  . Oxycodone     Son asked [place] not to prescribe narcotics for patient due to patient dependence.   See 08/26/14 note  . Penicillins Itching, Swelling and Rash    Tolerated Cefepime in ED.    ROS As per HPI  PE: Blood pressure 108/70, pulse 74, temperature 97.9 F (36.6 C), temperature source Oral, height 5\' 3"  (1.6 m), weight 172 lb (78.019 kg), SpO2 97 %. on 3L oxygen via nasal cannulae Gen: Alert, well appearing.  Patient is oriented to person, place, time, and situation. Has oxygen cannulae on. Has rolling walker with a seat.   CV: RRR, no m/r/g.   LUNGS: CTA bilat, nonlabored resps, good aeration in all lung fields. EXT: no clubbing, cyanosis, or edema.  Left calf circ 10cm below patella is 39 cm Right calf circ 10cm below patella is 38.5 cm. No rash, cord, or tenderness to palpation in calves/ankles/feet.   LABS:  Lab Results  Component Value Date   WBC 5.1 01/24/2015   HGB 12.9 01/24/2015   HCT 38.7 01/24/2015   MCV 92 01/24/2015   PLT 182 01/24/2015     Chemistry      Component Value Date/Time   NA 142 01/24/2015 1041   NA 143 10/24/2014 1148   K 3.6 01/24/2015 1041   K 4.1 10/24/2014 1148   CL 103 01/24/2015 1041   CL 106 10/24/2014 1148   CO2 28 01/24/2015 1041   CO2 29 10/24/2014 1148   BUN 10 01/24/2015 1041   BUN 10 10/24/2014 1148   CREATININE 0.9 01/24/2015 1041   CREATININE 0.89 10/24/2014 1148      Component Value Date/Time   CALCIUM 9.0 01/24/2015 1041   CALCIUM 9.1 10/24/2014 1148   ALKPHOS 74 01/24/2015 1041   ALKPHOS 90 10/24/2014 1148   AST 25 01/24/2015 1041   AST 15 10/24/2014 1148   ALT 20 01/24/2015 1041   ALT 11 10/24/2014 1148   BILITOT 0.60 01/24/2015 1041   BILITOT 0.3 10/24/2014 1148     Lab Results  Component Value Date   CHOL 152 01/31/2013   HDL 44 01/31/2013   LDLCALC 90 01/31/2013   TRIG 92 01/31/2013   CHOLHDL 3.5 01/31/2013   CC UA today: urine dark yellow and cloudy + foul smelling; small blood, nitrite pos, LEU large, otherwise normal.  IMPRESSION AND PLAN:  1) Major depression, recurrent.  She is still  displaying significant grief response to her husband's death 1 yr ago. Decided to increase her cymbalta from 60mg  qd to 90 mg qd today (60mg  cap + 30 mg cap once daily).  Continue group therapy at [facility] retirement home.  She has long been resistant to the idea of seeing a psychiatrist.  2) Chronic anxiety with hx of panic attacks; cymbalta changes as noted above, also continue xanax tid at 1mg  dose.  3) UTI; cipro  500 mg bid x 7d started, sent urine for c/s.  4) Chronic hypoxemic resp failure (asthma/asbestosis): continue pulm specialist f/u and supplemental oxygen (3 L/min).  An After Visit Summary was printed and given to the patient.  FOLLOW UP: Return in about 6 weeks (around 05/01/2015) for f/u anx/dep.

## 2015-03-23 LAB — URINE CULTURE: Colony Count: >=100000

## 2015-03-27 ENCOUNTER — Encounter: Payer: Self-pay | Admitting: Registered Nurse

## 2015-03-27 ENCOUNTER — Encounter: Payer: Medicare Other | Attending: Physical Medicine & Rehabilitation | Admitting: Registered Nurse

## 2015-03-27 VITALS — BP 124/65 | HR 91 | Resp 16

## 2015-03-27 DIAGNOSIS — M1711 Unilateral primary osteoarthritis, right knee: Secondary | ICD-10-CM | POA: Diagnosis not present

## 2015-03-27 DIAGNOSIS — Z79899 Other long term (current) drug therapy: Secondary | ICD-10-CM

## 2015-03-27 DIAGNOSIS — R2689 Other abnormalities of gait and mobility: Secondary | ICD-10-CM | POA: Diagnosis not present

## 2015-03-27 DIAGNOSIS — Z5181 Encounter for therapeutic drug level monitoring: Secondary | ICD-10-CM

## 2015-03-27 DIAGNOSIS — M5440 Lumbago with sciatica, unspecified side: Secondary | ICD-10-CM | POA: Diagnosis not present

## 2015-03-27 DIAGNOSIS — M797 Fibromyalgia: Secondary | ICD-10-CM

## 2015-03-27 DIAGNOSIS — F419 Anxiety disorder, unspecified: Secondary | ICD-10-CM

## 2015-03-27 DIAGNOSIS — F418 Other specified anxiety disorders: Secondary | ICD-10-CM | POA: Diagnosis not present

## 2015-03-27 DIAGNOSIS — F32A Depression, unspecified: Secondary | ICD-10-CM

## 2015-03-27 DIAGNOSIS — F329 Major depressive disorder, single episode, unspecified: Secondary | ICD-10-CM

## 2015-03-27 DIAGNOSIS — M1712 Unilateral primary osteoarthritis, left knee: Secondary | ICD-10-CM

## 2015-03-27 MED ORDER — OXYCODONE-ACETAMINOPHEN 10-325 MG PO TABS: 1.0000 | ORAL_TABLET | Freq: Four times a day (QID) | ORAL | Status: DC | PRN

## 2015-03-27 NOTE — Progress Notes (Signed)
Subjective:    Patient ID: Linda [name], female    DOB: August 06, 1946, 69 y.o.   MRN: [id number]  HPI: Linda [name] is a 69 year old female who returns for follow up for chronic pain and medication refill. She says her pain is located in her lower back, bilateral knee's and lower extremities. She rates her pain 9. Her current exercise regime is walking for short distances with her walker. She states her Knee pain has been excruciating she is following with her orthopedist ( Dr. Elmyra Ricks). Also states she was told she was not a surgical candidate for total knee replacement due to her co-morbidities. On Sunday 03/26/15 she was at [employer] the piano, she went to stand up from the piano bench, she had a hard time standing and fell on her knees. She manage to get herself up. She didn't seek medical attention. Instructed to ask the staff at [facility] to assist her with transferring she verbalizes understanding. Encouraged to use wheelchair as needed she verbalizes understanding.  Linda [name] was escorted to car via staff due to increase intensity of pain into bilateral knees. The [employer] driver was called he placed a call to the facility they will have a wheelchair to escort her to her apartment. She refuses to go to ED. She states she will call Dr. Elmyra Ricks.   Pain Inventory Average Pain 6 Pain Right Now 9 My pain is constant, sharp, dull and stabbing  In the last 24 hours, has pain interfered with the following? General activity 8 Relation with others 9 Enjoyment of life 10 What TIME of day is your pain at its worst? varies Sleep (in general) NA  Pain is worse with: walking, bending, standing and some activites Pain improves with: rest and heat/ice Relief from Meds: 5  Mobility walk with assistance use a walker how many minutes can you walk? <5 ability to climb steps?  no do you drive?  yes  Function retired  Neuro/Psych bladder control  problems weakness trouble walking anxiety  Prior Studies Any changes since last visit?  no  Physicians involved in your care Any changes since last visit?  no   Family History  Problem Relation Age of Onset  . Arthritis Mother   . Kidney disease Mother   . Heart disease Father   . Stroke Father   . Hypertension Father   . Diabetes Father    History   Social History  . Marital Status: Widowed    Spouse Name: N/A  . Number of Children: N/A  . Years of Education: N/A   Social History Main Topics  . Smoking status: Never Smoker   . Smokeless tobacco: Never Used     Comment: never used tobacco  . Alcohol Use: No  . Drug Use: No  . Sexual Activity: Not Currently   Other Topics Concern  . None   Social History Narrative   Widowed, 2 sons.  Relocated to [place] 09/2012 to be closer to her son who has MS.   Husband d 2015--mesothelioma.   Occupation: former Pharmacist, hospital.   Education: masters degree level.   No T/A/Ds.   Past Surgical History  Procedure Laterality Date  . Appendectomy  1960  . Total abdominal hysterectomy  1974  . Tendon release  1996    Right forearm and hand  . Knee surgery  2005  . Heel spur surgery Left 2008  . Plantar fascia release Left 2008  . Axillary surgery  Left 1978    Multiple "lump" in armpit per pt  . Coccyx removal  1972  . Cardiac catheterization  01/2013    nonobstructive CAD, EF 55-60%  . Transthoracic echocardiogram  01/2013; 04/2014    2014--NORMAL.  2015--focal basal septal hypertrophy, EF 55-60%, grade I diast dysfxn, mild LAE.    . Dilation and curettage of uterus  ? 1970's  . Eye surgery Left 2012-2013    "injections for ~ 1 yr; don't really know what for" (07/12/2013)  . Spirometry  04/25/14    In hosp for acute asthma/COPD flare: mixed obstructive and restrictive lung disease. The FEV1 is severely reduced at 45% predicted.  FEV1 signif decreased compared to prior spirometry 07/23/13.  [name] Kitchen Esophagogastroduodenoscopy N/A 07/19/2014     Gastritis found + in the setting of supratherapeutic INR, +plavix, + meloxicam.  . Left heart catheterization with coronary angiogram N/A 01/30/2013    Procedure: LEFT HEART CATHETERIZATION WITH CORONARY ANGIOGRAM;  Surgeon: Clent Demark, MD;  Location: [facility] CATH LAB;  Service: Cardiovascular;  Laterality: N/A;  . Cardiovascular stress test  02/22/15    Low risk myocard perf imaging; wall motion normal, normal EF    BP 124/65 mmHg  Pulse 91  Resp 16  SpO2 96%  Opioid Risk Score:   Fall Risk Score: High Fall Risk (>13 points) (previoulsy educatd and given handout) 1  Depression screen PHQ 2/9  Depression screen [facility] 2/9 03/27/2015 03/31/2014  Decreased Interest 3 0  Down, Depressed, Hopeless 3 0  PHQ - 2 Score 6 0  Altered sleeping 2 -  Tired, decreased energy 3 -  Change in appetite 2 -  Feeling bad or failure about yourself  2 -  Trouble concentrating 2 -  Moving slowly or fidgety/restless 0 -  Suicidal thoughts 0 -  PHQ-9 Score 17 -    Review of Systems  Genitourinary: Positive for dysuria.       Bladder control problems  Musculoskeletal: Positive for gait problem.  Neurological: Positive for weakness.  Psychiatric/Behavioral: The patient is nervous/anxious.   All other systems reviewed and are negative.      Objective:   Physical Exam  Constitutional: She is oriented to person, place, and time. She appears well-developed and well-nourished.  HENT:  Head: Normocephalic and atraumatic.  Neck: Normal range of motion. Neck supple.  Cardiovascular: Normal rate and regular rhythm.   Pulmonary/Chest: Effort normal and breath sounds normal.  Musculoskeletal:  Normal Muscle Bulk and Muscle Testing Reveals: Upper Extremities: Full ROM and Muscle Strength 5/5 Left Arm Extension Decreased ROM Right Spine of Scapula Tenderness Back without spinal or Paraspinal tenderness Lower Extremities: Decreased ROM and Muscle strength 4/5 Bilateral Lower extremities Flexion Produces Pain  into Patella's Assisted to her walker seat, transported to car via staff    Neurological: She is alert and oriented to person, place, and time.  Skin: Skin is warm and dry.  Psychiatric: She has a normal mood and affect.  Nursing note and vitals reviewed.         Assessment & Plan:  1. Functional deficits secondary to Gait disorder: Continue with HEP. 2. Chronic Back pain/fibromyalgia/R>L Knee OA Pain Management:  Refilled: Oxycodone 10/325 one tablet every 6 hours #100 Continue Lyrica and Voltaren Gel  3. Depression with anxiety/Grief reaction/Mood: Continue Trazodone and Cymbalta .  4. [name] with asthma: Oxygen dependent. Albuterol prn.  On Continuous Oxygen Therapy 3 liters/ Pulmonology Following. 5. Bilateral Osteoarthritis of Bilateral Knees/ Excruciating Pain Refuses ED . She  states she will F/U Dr. Elmyra Ricks.  30 minutes of face to face patient care time was spent during this visit. All questions were encouraged and answered.

## 2015-04-03 DIAGNOSIS — M150 Primary generalized (osteo)arthritis: Secondary | ICD-10-CM | POA: Diagnosis not present

## 2015-04-03 DIAGNOSIS — M797 Fibromyalgia: Secondary | ICD-10-CM | POA: Diagnosis not present

## 2015-04-03 DIAGNOSIS — M2550 Pain in unspecified joint: Secondary | ICD-10-CM | POA: Diagnosis not present

## 2015-04-03 DIAGNOSIS — M5137 Other intervertebral disc degeneration, lumbosacral region: Secondary | ICD-10-CM | POA: Diagnosis not present

## 2015-04-07 ENCOUNTER — Other Ambulatory Visit: Payer: Self-pay | Admitting: Family Medicine

## 2015-04-07 MED ORDER — ALBUTEROL SULFATE (2.5 MG/3ML) 0.083% IN NEBU: 2.5000 mg | INHALATION_SOLUTION | Freq: Four times a day (QID) | RESPIRATORY_TRACT | Status: DC | PRN

## 2015-04-07 NOTE — Telephone Encounter (Signed)
Refill request for alprazolam Last filled by MD on- 10/05/14 #90 x5 Last Appt: 03/20/2015 Next Appt: 05/03/2015 Please advise refill?

## 2015-04-13 ENCOUNTER — Encounter: Payer: Self-pay | Admitting: Family Medicine

## 2015-04-21 DIAGNOSIS — M1712 Unilateral primary osteoarthritis, left knee: Secondary | ICD-10-CM | POA: Diagnosis not present

## 2015-04-21 DIAGNOSIS — M1711 Unilateral primary osteoarthritis, right knee: Secondary | ICD-10-CM | POA: Diagnosis not present

## 2015-04-27 ENCOUNTER — Other Ambulatory Visit: Payer: Self-pay | Admitting: Family Medicine

## 2015-04-27 ENCOUNTER — Other Ambulatory Visit: Payer: Self-pay | Admitting: Nurse Practitioner

## 2015-04-27 DIAGNOSIS — D510 Vitamin B12 deficiency anemia due to intrinsic factor deficiency: Secondary | ICD-10-CM

## 2015-04-27 DIAGNOSIS — M797 Fibromyalgia: Secondary | ICD-10-CM

## 2015-04-27 DIAGNOSIS — D509 Iron deficiency anemia, unspecified: Secondary | ICD-10-CM

## 2015-04-27 MED ORDER — PREGABALIN 300 MG PO CAPS: ORAL_CAPSULE | ORAL | Status: DC

## 2015-04-28 ENCOUNTER — Other Ambulatory Visit: Payer: Self-pay | Admitting: *Deleted

## 2015-04-28 DIAGNOSIS — D510 Vitamin B12 deficiency anemia due to intrinsic factor deficiency: Secondary | ICD-10-CM

## 2015-04-28 DIAGNOSIS — D509 Iron deficiency anemia, unspecified: Secondary | ICD-10-CM

## 2015-04-28 DIAGNOSIS — M797 Fibromyalgia: Secondary | ICD-10-CM

## 2015-04-28 NOTE — Telephone Encounter (Signed)
error 

## 2015-05-01 ENCOUNTER — Ambulatory Visit: Payer: Medicare Other | Admitting: Family

## 2015-05-01 ENCOUNTER — Ambulatory Visit: Payer: Medicare Other

## 2015-05-01 ENCOUNTER — Ambulatory Visit: Payer: Medicare Other | Admitting: Registered Nurse

## 2015-05-01 ENCOUNTER — Other Ambulatory Visit: Payer: Medicare Other

## 2015-05-02 ENCOUNTER — Encounter: Payer: Self-pay | Admitting: Registered Nurse

## 2015-05-02 ENCOUNTER — Encounter: Payer: Medicare Other | Attending: Physical Medicine & Rehabilitation | Admitting: Registered Nurse

## 2015-05-02 VITALS — BP 146/72 | HR 74 | Resp 14

## 2015-05-02 DIAGNOSIS — M797 Fibromyalgia: Secondary | ICD-10-CM | POA: Diagnosis not present

## 2015-05-02 DIAGNOSIS — M1712 Unilateral primary osteoarthritis, left knee: Secondary | ICD-10-CM

## 2015-05-02 DIAGNOSIS — M5440 Lumbago with sciatica, unspecified side: Secondary | ICD-10-CM | POA: Diagnosis not present

## 2015-05-02 DIAGNOSIS — Z5181 Encounter for therapeutic drug level monitoring: Secondary | ICD-10-CM | POA: Insufficient documentation

## 2015-05-02 DIAGNOSIS — R2689 Other abnormalities of gait and mobility: Secondary | ICD-10-CM | POA: Diagnosis not present

## 2015-05-02 DIAGNOSIS — F329 Major depressive disorder, single episode, unspecified: Secondary | ICD-10-CM

## 2015-05-02 DIAGNOSIS — Z79899 Other long term (current) drug therapy: Secondary | ICD-10-CM | POA: Insufficient documentation

## 2015-05-02 DIAGNOSIS — F418 Other specified anxiety disorders: Secondary | ICD-10-CM

## 2015-05-02 DIAGNOSIS — F419 Anxiety disorder, unspecified: Secondary | ICD-10-CM

## 2015-05-02 DIAGNOSIS — M1711 Unilateral primary osteoarthritis, right knee: Secondary | ICD-10-CM | POA: Insufficient documentation

## 2015-05-02 MED ORDER — OXYCODONE-ACETAMINOPHEN 10-325 MG PO TABS: 1.0000 | ORAL_TABLET | Freq: Four times a day (QID) | ORAL | Status: DC | PRN

## 2015-05-02 NOTE — Progress Notes (Signed)
Subjective:    Patient ID: Linda [name], female    DOB: Jan 03, 1946, 69 y.o.   MRN: [id number]  HPI: [name] is a 69 year old female who returns for follow up for chronic pain and medication refill. She says her pain is located in her neck, lower back radiating into her right lower extremity laterally, bilateral knee's and lower extremities. She rates her pain 7. Her current exercise regime is walking for short distances with her walker and using stationary bicycle.  Also states she is concerned about her son who has Multiple Sclerosis he's going through a flare, emotional support given.  Pain Inventory Average Pain 6 Pain Right Now 7 My pain is sharp, stabbing and aching  In the last 24 hours, has pain interfered with the following? General activity 8 Relation with others 8 Enjoyment of life 10 What TIME of day is your pain at its worst? morning,evening  Sleep (in general) Poor  Pain is worse with: walking, bending, standing and some activites Pain improves with: rest, heat/ice, medication and injections Relief from Meds: 6  Mobility walk with assistance use a walker how many minutes can you walk? 5 ability to climb steps?  no do you drive?  yes use a wheelchair  Function retired I need assistance with the following:  shopping  Neuro/Psych bladder control problems weakness trouble walking spasms depression anxiety  Prior Studies Any changes since last visit?  no  Physicians involved in your care Any changes since last visit?  no   Family History  Problem Relation Age of Onset  . Arthritis Mother   . Kidney disease Mother   . Heart disease Father   . Stroke Father   . Hypertension Father   . Diabetes Father    History   Social History  . Marital Status: Widowed    Spouse Name: N/A  . Number of Children: N/A  . Years of Education: N/A   Social History Main Topics  . Smoking status: Never Smoker   . Smokeless tobacco: Never Used      Comment: never used tobacco  . Alcohol Use: No  . Drug Use: No  . Sexual Activity: Not Currently   Other Topics Concern  . None   Social History Narrative   Widowed, 2 sons.  Relocated to [place] 09/2012 to be closer to her son who has MS.   Husband d 2015--mesothelioma.   Occupation: former Pharmacist, hospital.   Education: masters degree level.   No T/A/Ds.   Past Surgical History  Procedure Laterality Date  . Appendectomy  1960  . Total abdominal hysterectomy  1974  . Tendon release  1996    Right forearm and hand  . Knee surgery  2005  . Heel spur surgery Left 2008  . Plantar fascia release Left 2008  . Axillary surgery Left 1978    Multiple "lump" in armpit per pt  . Coccyx removal  1972  . Cardiac catheterization  01/2013    nonobstructive CAD, EF 55-60%  . Transthoracic echocardiogram  01/2013; 04/2014    2014--NORMAL.  2015--focal basal septal hypertrophy, EF 55-60%, grade I diast dysfxn, mild LAE.    . Dilation and curettage of uterus  ? 1970's  . Eye surgery Left 2012-2013    "injections for ~ 1 yr; don't really know what for" (07/12/2013)  . Spirometry  04/25/14    In hosp for acute asthma/COPD flare: mixed obstructive and restrictive lung disease. The FEV1 is severely reduced  at 45% predicted.  FEV1 signif decreased compared to prior spirometry 07/23/13.  [name] Kitchen Esophagogastroduodenoscopy N/A 07/19/2014    Gastritis found + in the setting of supratherapeutic INR, +plavix, + meloxicam.  . Left heart catheterization with coronary angiogram N/A 01/30/2013    Procedure: LEFT HEART CATHETERIZATION WITH CORONARY ANGIOGRAM;  Surgeon: Clent Demark, MD;  Location: [facility] CATH LAB;  Service: Cardiovascular;  Laterality: N/A;  . Cardiovascular stress test  02/22/15    Low risk myocard perf imaging; wall motion normal, normal EF    BP 146/72 mmHg  Pulse 74  Resp 14  SpO2 96%  Opioid Risk Score:   Fall Risk Score: High Fall Risk (>13 points)`1  Depression screen PHQ 2/9  Depression screen [facility]  2/9 03/27/2015 03/31/2014  Decreased Interest 3 0  Down, Depressed, Hopeless 3 0  PHQ - 2 Score 6 0  Altered sleeping 2 -  Tired, decreased energy 3 -  Change in appetite 2 -  Feeling bad or failure about yourself  2 -  Trouble concentrating 2 -  Moving slowly or fidgety/restless 0 -  Suicidal thoughts 0 -  PHQ-9 Score 17 -     Review of Systems  Constitutional:       Night sweats Weight gain  Respiratory: Positive for shortness of breath.   Genitourinary: Positive for urgency.       Incontinence  Musculoskeletal: Positive for gait problem.  Neurological: Positive for weakness.       Spasms  Psychiatric/Behavioral: Positive for dysphoric mood. The patient is nervous/anxious.   All other systems reviewed and are negative.      Objective:   Physical Exam  Constitutional: She is oriented to person, place, and time. She appears well-developed and well-nourished.  HENT:  Head: Normocephalic and atraumatic.  Neck: Normal range of motion. Neck supple.  Cervical Paraspinal Tenderness: C-3- C-5  Cardiovascular: Normal rate and regular rhythm.   Pulmonary/Chest: Effort normal and breath sounds normal.  Continuous Oxygen 3 liters nasal cannula  Musculoskeletal:  Normal Muscle Bulk and Muscle Testing Reveals: Upper Extremities: Full ROM and Muscle Strength 5/5 Thoracic Tightness: T-1- T-4 Lumbar Paraspinal Tenderness: L-3- L-5 Lower Extremities: Full ROM and Muscle Strength 5/5 Bilateral Lower Extremities Flexion Produces Pain into Patella's Arises from chair slowly/ Using cadillac walker for support Antalgic Gait  Neurological: She is alert and oriented to person, place, and time.  Skin: Skin is warm and dry.  Psychiatric: She has a normal mood and affect.  Nursing note and vitals reviewed.         Assessment & Plan:  1. Functional deficits secondary to Gait disorder: Continue with HEP. 2. Chronic Back pain/fibromyalgia/R>L Knee OA Pain Management:  Refilled:  Oxycodone 10/325 one tablet every 6 hours #100 Continue Lyrica and Voltaren Gel  3. Depression with anxiety/Grief reaction/Mood: Continue Trazodone and Cymbalta .  4. [name] with asthma: Oxygen dependent. Albuterol prn.  On Continuous Oxygen Therapy 3 liters/ Pulmonology Following. 5. Bilateral Osteoarthritis of Bilateral Knees/ Excruciating Pain F/U Dr. Elmyra Ricks. 6. Fibromyalgia: Continue with exercise regime.  30 minutes of face to face patient care time was spent during this visit. All questions were encouraged and answered.

## 2015-05-03 ENCOUNTER — Other Ambulatory Visit: Payer: Self-pay | Admitting: Family Medicine

## 2015-05-03 ENCOUNTER — Encounter: Payer: Self-pay | Admitting: Family Medicine

## 2015-05-03 ENCOUNTER — Ambulatory Visit (INDEPENDENT_AMBULATORY_CARE_PROVIDER_SITE_OTHER): Payer: Medicare Other | Admitting: Family Medicine

## 2015-05-03 VITALS — BP 125/78 | HR 71 | Temp 97.8°F | Resp 16 | Wt 178.0 lb

## 2015-05-03 DIAGNOSIS — Z23 Encounter for immunization: Secondary | ICD-10-CM | POA: Diagnosis not present

## 2015-05-03 MED ORDER — PANTOPRAZOLE SODIUM 40 MG PO TBEC
40.0000 mg | DELAYED_RELEASE_TABLET | Freq: Every day | ORAL | Status: DC
Start: 2015-05-03 — End: 2016-08-13

## 2015-05-03 MED ORDER — ISOSORBIDE MONONITRATE ER 30 MG PO TB24: 30.0000 mg | ORAL_TABLET | Freq: Every day | ORAL | Status: DC

## 2015-05-03 MED ORDER — SCOPOLAMINE 1 MG/3DAYS TD PT72: 1.0000 | MEDICATED_PATCH | TRANSDERMAL | Status: DC

## 2015-05-03 MED ORDER — ATORVASTATIN CALCIUM 20 MG PO TABS: 20.0000 mg | ORAL_TABLET | Freq: Every day | ORAL | Status: DC

## 2015-05-03 MED ORDER — NEBIVOLOL HCL 5 MG PO TABS: 5.0000 mg | ORAL_TABLET | Freq: Every day | ORAL | Status: DC

## 2015-05-03 MED ORDER — ARIPIPRAZOLE 2 MG PO TABS: ORAL_TABLET | ORAL | Status: DC

## 2015-05-03 MED ORDER — DULOXETINE HCL 60 MG PO CPEP: ORAL_CAPSULE | ORAL | Status: DC

## 2015-05-03 NOTE — Progress Notes (Signed)
Pre visit review using our clinic review tool, if applicable. No additional management support is needed unless otherwise documented below in the visit note. 

## 2015-05-03 NOTE — Progress Notes (Signed)
OFFICE NOTE  05/03/2015  CC:  Chief Complaint  Patient presents with  . Follow-up    6 week f/u on Anxiety and Depression    HPI: Patient is a 69 y.o. Caucasian female who is here for 6 week f/u anxiety/depression.  Last visit I increased her cymbalta dose to 90 mg/day.  Feeling improved regarding mood/anxiety, "but not where I want to be".  No adverse side effects felt from increasing cymbalta. She is in favor of adding on med to help with depression at this time.    She requests scopolamine patch b/c [place] some [place].   Pertinent PMH:  Past medical, surgical, social, and family history reviewed and no changes are noted since last office visit.  MEDS:  Outpatient Prescriptions Prior to Visit  Medication Sig Dispense Refill  . albuterol (PROVENTIL HFA;VENTOLIN HFA) 108 (90 BASE) MCG/ACT inhaler Inhale 2 puffs into the lungs every 6 (six) hours as needed for wheezing or shortness of breath. 1 Inhaler 1  . albuterol (PROVENTIL) (2.5 MG/3ML) 0.083% nebulizer solution Take 3 mLs (2.5 mg total) by nebulization every 6 (six) hours as needed for wheezing or shortness of breath. 75 mL 1  . ALPRAZolam (XANAX) 1 MG tablet TAKE 1 TABLET 3 TIMES A DAY. 90 tablet 5  . atorvastatin (LIPITOR) 20 MG tablet TAKE 1 TABLET AT 6PM. 30 tablet 6  . budesonide-formoterol (SYMBICORT) 160-4.5 MCG/ACT inhaler Inhale 2 puffs into the lungs 2 (two) times daily. 1 Inhaler 12  . Cholecalciferol (VITAMIN D3) 50000 UNITS CAPS Take 50,000 Units by mouth once a week. 12 capsule 0  . diclofenac sodium (VOLTAREN) 1 % GEL Apply 4 g topically 3 (three) times daily. TO BOTH KNEES 100 g 6  . DULoxetine (CYMBALTA) 30 MG capsule 1 cap po qd to be combined with a 60mg  duloxetine cap 30 capsule 6  . DULoxetine (CYMBALTA) 60 MG capsule 1 cap po qd to be combined with a 30 mg duloxetine cap daily 30 capsule 6  . folic acid (FOLVITE) 1 MG tablet Take 2 tablets (2 mg  total) by mouth daily. 60 tablet 6  . isosorbide mononitrate (IMDUR) 30 MG 24 hr tablet TAKE 1 TABLET ONCE DAILY. 30 tablet 6  . nebivolol (BYSTOLIC) 5 MG tablet Take 1 tablet (5 mg total) by mouth daily. 30 tablet 6  . ondansetron (ZOFRAN) 4 MG tablet Take 1 tablet (4 mg total) by mouth every 6 (six) hours. 12 tablet 0  . oxyCODONE-acetaminophen (PERCOCET) 10-325 MG per tablet Take 1 tablet by mouth every 6 (six) hours as needed for pain. ONE MONTH SUPPLY 100 tablet 0  . pantoprazole (PROTONIX) 40 MG tablet TAKE 1 TABLET ONCE DAILY. 30 tablet 6  . polyethylene glycol powder (GLYCOLAX/MIRALAX) powder Take 17 g by mouth daily. 3350 g 6  . pregabalin (LYRICA) 300 MG capsule Take 1 capsule twice a day 60 capsule 5  . traZODone (DESYREL) 50 MG tablet Take 3 tablets (150 mg total) by mouth at bedtime. 90 tablet 6  . MAGNESIUM CARBONATE PO Take 400 mg by mouth every morning.    [name] Kitchen atorvastatin (LIPITOR) 20 MG tablet Take 1 tablet (20 mg total) by mouth daily at 6 PM. 30 tablet 6  . isosorbide mononitrate (IMDUR) 30 MG 24 hr tablet Take 1 tablet (30 mg total) by mouth daily. 30 tablet 6  . pantoprazole (PROTONIX) 40 MG tablet Take 1 tablet (40 mg  total) by mouth daily. 30 tablet 6   Facility-Administered Medications Prior to Visit  Medication Dose Route Frequency Provider Last Rate Last Dose  . ferumoxytol (FERAHEME) 510 mg in sodium chloride 0.9 % 100 mL IVPB  510 mg Intravenous Once Volanda Napoleon, MD        PE: Blood pressure 125/78, pulse 71, temperature 97.8 F (36.6 C), temperature source Oral, resp. rate 16, weight 178 lb (80.74 kg), SpO2 96 %. Gen: Alert, well appearing.  Patient is oriented to person, place, time, and situation. AFFECT: pleasant, lucid thought and speech. No further exam today.  LABS: Lab Results  Component Value Date   WBC 5.1 01/24/2015   HGB 12.9 01/24/2015   HCT 38.7 01/24/2015   MCV 92 01/24/2015   PLT 182 01/24/2015     Chemistry      Component Value  Date/Time   NA 142 01/24/2015 1041   NA 143 10/24/2014 1148   K 3.6 01/24/2015 1041   K 4.1 10/24/2014 1148   CL 103 01/24/2015 1041   CL 106 10/24/2014 1148   CO2 28 01/24/2015 1041   CO2 29 10/24/2014 1148   BUN 10 01/24/2015 1041   BUN 10 10/24/2014 1148   CREATININE 0.9 01/24/2015 1041   CREATININE 0.89 10/24/2014 1148      Component Value Date/Time   CALCIUM 9.0 01/24/2015 1041   CALCIUM 9.1 10/24/2014 1148   ALKPHOS 74 01/24/2015 1041   ALKPHOS 90 10/24/2014 1148   AST 25 01/24/2015 1041   AST 15 10/24/2014 1148   ALT 20 01/24/2015 1041   ALT 11 10/24/2014 1148   BILITOT 0.60 01/24/2015 1041   BILITOT 0.3 10/24/2014 1148     Lab Results  Component Value Date   TSH 3.736 07/20/2013   Lab Results  Component Value Date   CHOL 152 01/31/2013   HDL 44 01/31/2013   LDLCALC 90 01/31/2013   TRIG 92 01/31/2013   CHOLHDL 3.5 01/31/2013     IMPRESSION AND PLAN:  1) Major depression, recurrent, severe.  Prolonged grief response to the death of her husband > 1 yr ago. GAD as well. She is mildly improved.  Will add low dose abilify: 2 mg qhs x 15d, then increase to 4 mg qhs. Warned pt about potential for serotonin syndrome, told her sx's to watch for.  Scopolamine patch rx'd today [place].  An After Visit Summary was printed and given to the patient.  FOLLOW UP: 4-5 weeks

## 2015-05-08 ENCOUNTER — Telehealth: Payer: Self-pay

## 2015-05-08 NOTE — Telephone Encounter (Signed)
Patient refused Mammogram at this time. Wishes to schedule in the future.

## 2015-05-16 ENCOUNTER — Ambulatory Visit: Payer: Medicare Other | Admitting: Family

## 2015-05-16 ENCOUNTER — Ambulatory Visit: Payer: Medicare Other

## 2015-05-16 ENCOUNTER — Other Ambulatory Visit: Payer: Medicare Other

## 2015-05-17 ENCOUNTER — Telehealth: Payer: Self-pay | Admitting: Family

## 2015-05-17 NOTE — Telephone Encounter (Signed)
Spoke with pt regarding appt for 5/31 at 1pm. Pt confirmed appt.

## 2015-05-18 DIAGNOSIS — I2510 Atherosclerotic heart disease of native coronary artery without angina pectoris: Secondary | ICD-10-CM | POA: Diagnosis not present

## 2015-05-18 DIAGNOSIS — G4733 Obstructive sleep apnea (adult) (pediatric): Secondary | ICD-10-CM | POA: Diagnosis not present

## 2015-05-18 DIAGNOSIS — G894 Chronic pain syndrome: Secondary | ICD-10-CM | POA: Diagnosis not present

## 2015-05-18 DIAGNOSIS — E785 Hyperlipidemia, unspecified: Secondary | ICD-10-CM | POA: Diagnosis not present

## 2015-05-18 DIAGNOSIS — E559 Vitamin D deficiency, unspecified: Secondary | ICD-10-CM | POA: Diagnosis not present

## 2015-05-18 DIAGNOSIS — J45909 Unspecified asthma, uncomplicated: Secondary | ICD-10-CM | POA: Diagnosis not present

## 2015-05-18 DIAGNOSIS — K219 Gastro-esophageal reflux disease without esophagitis: Secondary | ICD-10-CM | POA: Diagnosis not present

## 2015-05-18 DIAGNOSIS — D649 Anemia, unspecified: Secondary | ICD-10-CM | POA: Diagnosis not present

## 2015-05-18 DIAGNOSIS — M1990 Unspecified osteoarthritis, unspecified site: Secondary | ICD-10-CM | POA: Diagnosis not present

## 2015-05-18 DIAGNOSIS — I209 Angina pectoris, unspecified: Secondary | ICD-10-CM | POA: Diagnosis not present

## 2015-05-18 DIAGNOSIS — I10 Essential (primary) hypertension: Secondary | ICD-10-CM | POA: Diagnosis not present

## 2015-05-18 DIAGNOSIS — E119 Type 2 diabetes mellitus without complications: Secondary | ICD-10-CM | POA: Diagnosis not present

## 2015-05-23 ENCOUNTER — Other Ambulatory Visit: Payer: Medicare Other

## 2015-05-23 ENCOUNTER — Ambulatory Visit: Payer: Medicare Other | Admitting: Family

## 2015-05-23 ENCOUNTER — Ambulatory Visit: Payer: Medicare Other

## 2015-05-23 DIAGNOSIS — M1712 Unilateral primary osteoarthritis, left knee: Secondary | ICD-10-CM | POA: Diagnosis not present

## 2015-05-23 DIAGNOSIS — M1711 Unilateral primary osteoarthritis, right knee: Secondary | ICD-10-CM | POA: Diagnosis not present

## 2015-05-25 ENCOUNTER — Telehealth: Payer: Self-pay | Admitting: Family Medicine

## 2015-05-25 NOTE — Telephone Encounter (Signed)
Bradd Canary ([place]) called in regards to Linda [name]. She was wondering since Allexis is a high risk pt. was there anything else she could do for her. Contact her at [phone].

## 2015-05-29 ENCOUNTER — Telehealth: Payer: Self-pay | Admitting: Family

## 2015-05-29 ENCOUNTER — Ambulatory Visit (INDEPENDENT_AMBULATORY_CARE_PROVIDER_SITE_OTHER): Payer: Medicare Other | Admitting: Family Medicine

## 2015-05-29 ENCOUNTER — Encounter: Payer: Self-pay | Admitting: Family Medicine

## 2015-05-29 VITALS — BP 169/89 | HR 72 | Temp 97.7°F | Resp 18 | Wt 171.0 lb

## 2015-05-29 DIAGNOSIS — I10 Essential (primary) hypertension: Secondary | ICD-10-CM

## 2015-05-29 DIAGNOSIS — R0989 Other specified symptoms and signs involving the circulatory and respiratory systems: Secondary | ICD-10-CM

## 2015-05-29 DIAGNOSIS — Z13820 Encounter for screening for osteoporosis: Secondary | ICD-10-CM

## 2015-05-29 DIAGNOSIS — M810 Age-related osteoporosis without current pathological fracture: Secondary | ICD-10-CM | POA: Diagnosis not present

## 2015-05-29 DIAGNOSIS — R0789 Other chest pain: Secondary | ICD-10-CM

## 2015-05-29 DIAGNOSIS — F331 Major depressive disorder, recurrent, moderate: Secondary | ICD-10-CM | POA: Diagnosis not present

## 2015-05-29 MED ORDER — ARIPIPRAZOLE 15 MG PO TBDP: ORAL_TABLET | ORAL | Status: DC

## 2015-05-29 NOTE — Progress Notes (Signed)
OFFICE NOTE  05/29/2015  CC:  Chief Complaint  Patient presents with  . Follow-up    4-5 week f/u. Pt is not fasting.     HPI: Patient is a 69 y.o. Caucasian female who is here for 1 mo f/u depression. Added abilify as aumenting depression treatment last visit. No side effects but says it has not helped with mood.  She does not feel worse.   Comments on more chest pains lately: describes L parasternal area pain, heaviness, tightness, usually comes on at rest, question of worsening when she does get up and move around.  +Diaphoresis does occur but no jaw or arm pain.  NTG x 2 helps resolve it.   No CP at this time. She had nuclear stress test with Dr. Terrence Dupont 4-5 mo ago per her report today, describes high bp but no other results.  Review of his report in [facility] shows it to be incomplete, basically normal except for pt having bp 200/100 pre test, 230/100 post-test.  Says she exercised 11 min but pt states she did not exercise at all.   Will have to try to obtain his office notes/records from this year.  She admits she is feeling like she has a "broken heart" lately, feels like she is grieving for the death of "someone". She has long been having prolonged/severe grief response from the death of her husband over a year ago.  Pertinent PMH:  Past medical, surgical, social, and family history reviewed and no changes are noted since last office visit.  MEDS:  Outpatient Prescriptions Prior to Visit  Medication Sig Dispense Refill  . albuterol (PROVENTIL HFA;VENTOLIN HFA) 108 (90 BASE) MCG/ACT inhaler Inhale 2 puffs into the lungs every 6 (six) hours as needed for wheezing or shortness of breath. 1 Inhaler 1  . albuterol (PROVENTIL) (2.5 MG/3ML) 0.083% nebulizer solution Take 3 mLs (2.5 mg total) by nebulization every 6 (six) hours as needed for wheezing or shortness of breath. 75 mL 1  . ALPRAZolam (XANAX) 1 MG tablet TAKE 1 TABLET 3 TIMES A DAY. 90 tablet 5  . ARIPiprazole (ABILIFY) 2 MG  tablet 1 tab po qhs x 15d, then increase to 2 tabs po qhs 60 tablet 0  . atorvastatin (LIPITOR) 20 MG tablet Take 1 tablet (20 mg total) by mouth daily at 6 PM. 90 tablet 1  . budesonide-formoterol (SYMBICORT) 160-4.5 MCG/ACT inhaler Inhale 2 puffs into the lungs 2 (two) times daily. 1 Inhaler 12  . Cholecalciferol (VITAMIN D3) 50000 UNITS CAPS Take 50,000 Units by mouth once a week. 12 capsule 0  . diclofenac sodium (VOLTAREN) 1 % GEL Apply 4 g topically 3 (three) times daily. TO BOTH KNEES 100 g 6  . DULoxetine (CYMBALTA) 30 MG capsule 1 cap po qd to be combined with a 60mg  duloxetine cap 30 capsule 6  . DULoxetine (CYMBALTA) 60 MG capsule 1 cap po qd to be combined with a 30 mg duloxetine cap daily 90 capsule 1  . folic acid (FOLVITE) 1 MG tablet Take 2 tablets (2 mg total) by mouth daily. 60 tablet 6  . isosorbide mononitrate (IMDUR) 30 MG 24 hr tablet Take 1 tablet (30 mg total) by mouth daily. 90 tablet 1  . MAGNESIUM CARBONATE PO Take 400 mg by mouth every morning.    . nebivolol (BYSTOLIC) 5 MG tablet Take 1 tablet (5 mg total) by mouth daily. 30 tablet 6  . ondansetron (ZOFRAN) 4 MG tablet Take 1 tablet (4 mg total) by mouth  every 6 (six) hours. 12 tablet 0  . oxyCODONE-acetaminophen (PERCOCET) 10-325 MG per tablet Take 1 tablet by mouth every 6 (six) hours as needed for pain. ONE MONTH SUPPLY 100 tablet 0  . pantoprazole (PROTONIX) 40 MG tablet Take 1 tablet (40 mg total) by mouth daily. 90 tablet 1  . polyethylene glycol powder (GLYCOLAX/MIRALAX) powder Take 17 g by mouth daily. 3350 g 6  . pregabalin (LYRICA) 300 MG capsule Take 1 capsule twice a day 60 capsule 5  . traZODone (DESYREL) 50 MG tablet Take 3 tablets (150 mg total) by mouth at bedtime. 90 tablet 6  . scopolamine (TRANSDERM-SCOP, 1.5 MG,) 1 MG/3DAYS Place 1 patch (1.5 mg total) onto the skin every 3 (three) days. (Patient not taking: Reported on 05/29/2015) 4 patch 3   Facility-Administered Medications Prior to Visit   Medication Dose Route Frequency Provider Last Rate Last Dose  . ferumoxytol (FERAHEME) 510 mg in sodium chloride 0.9 % 100 mL IVPB  510 mg Intravenous Once Volanda Napoleon, MD        PE: Blood pressure 169/89, pulse 72, temperature 97.7 F (36.5 C), temperature source Oral, resp. rate 18, weight 171 lb (77.565 kg), SpO2 95 %. pulse oximetry was done on 2L oxygen today. Repeat BP today 136/88, manual cuff. Gen: Alert, well appearing.  Patient is oriented to person, place, time, and situation. Crying when talking but she was able to control herself and talk.   CWU:GQBV: no injection, icteris, swelling, or exudate.  EOMI, PERRLA. Mouth: lips without lesion/swelling.  Oral mucosa pink and moist. Oropharynx without erythema, exudate, or swelling.  CV: RRR, no m/r/g.   LUNGS: CTA bilat, nonlabored resps, good aeration in all lung fields. EXT: no clubbing, cyanosis, or edema.   LAB: None today.  IMPRESSION AND PLAN:  1) Depression/prolonged grief response.  Unresponsive to therapies over the longterm as well as with some recent med changes.  Will increase abilify to 1/2 of 15mg  tab qd and have her change dosing time to [facility]; continue all other psych meds.  She declined counseling once again today.  2) Chest pain, atypical, suspect noncardiac/nonpulmonary etiology.  Suspect that her chronic pain syndrome/fibromyalgia is playing a role with this. Signs/symptoms to call or return for were reviewed and pt expressed understanding. Will request Dr. Zenia Resides records from office visits and stress testing earlier this year.  3) Osteoporosis screening: DEXA ordered today.  4) Labile HTN: encouraged pt to check bp at home regularly between now and next visit. If avg >140 syst or > 90 diast then need to increase/add antihypertensive.[name] Kitchen  An After Visit Summary was printed and given to the patient.  FOLLOW UP: 1 mo

## 2015-05-29 NOTE — Telephone Encounter (Signed)
Schedule appt on 6/13. Pt confirmed appt

## 2015-05-29 NOTE — Progress Notes (Signed)
Pre visit review using our clinic review tool, if applicable. No additional management support is needed unless otherwise documented below in the visit note. 

## 2015-06-01 ENCOUNTER — Other Ambulatory Visit: Payer: Self-pay | Admitting: Registered Nurse

## 2015-06-01 ENCOUNTER — Encounter: Payer: Medicare Other | Attending: Physical Medicine & Rehabilitation | Admitting: Registered Nurse

## 2015-06-01 ENCOUNTER — Encounter: Payer: Self-pay | Admitting: Registered Nurse

## 2015-06-01 VITALS — BP 140/66 | HR 78 | Resp 16

## 2015-06-01 DIAGNOSIS — Z5181 Encounter for therapeutic drug level monitoring: Secondary | ICD-10-CM | POA: Diagnosis not present

## 2015-06-01 DIAGNOSIS — M1712 Unilateral primary osteoarthritis, left knee: Secondary | ICD-10-CM

## 2015-06-01 DIAGNOSIS — G894 Chronic pain syndrome: Secondary | ICD-10-CM | POA: Diagnosis not present

## 2015-06-01 DIAGNOSIS — M1711 Unilateral primary osteoarthritis, right knee: Secondary | ICD-10-CM | POA: Diagnosis not present

## 2015-06-01 DIAGNOSIS — M797 Fibromyalgia: Secondary | ICD-10-CM

## 2015-06-01 DIAGNOSIS — F329 Major depressive disorder, single episode, unspecified: Secondary | ICD-10-CM

## 2015-06-01 DIAGNOSIS — F418 Other specified anxiety disorders: Secondary | ICD-10-CM

## 2015-06-01 DIAGNOSIS — M5440 Lumbago with sciatica, unspecified side: Secondary | ICD-10-CM

## 2015-06-01 DIAGNOSIS — R2689 Other abnormalities of gait and mobility: Secondary | ICD-10-CM | POA: Diagnosis not present

## 2015-06-01 DIAGNOSIS — Z79899 Other long term (current) drug therapy: Secondary | ICD-10-CM | POA: Insufficient documentation

## 2015-06-01 DIAGNOSIS — F419 Anxiety disorder, unspecified: Secondary | ICD-10-CM

## 2015-06-01 DIAGNOSIS — H43393 Other vitreous opacities, bilateral: Secondary | ICD-10-CM | POA: Diagnosis not present

## 2015-06-01 DIAGNOSIS — J984 Other disorders of lung: Secondary | ICD-10-CM | POA: Diagnosis not present

## 2015-06-01 MED ORDER — OXYCODONE-ACETAMINOPHEN 10-325 MG PO TABS: 1.0000 | ORAL_TABLET | Freq: Four times a day (QID) | ORAL | Status: DC | PRN

## 2015-06-01 NOTE — Progress Notes (Signed)
Subjective:    Patient ID: Linda [name], female    DOB: [birth year], 69 y.o.   MRN: [id number]  HPI: [name] is a 69 year old female who returns for follow up for chronic pain and medication refill. She says her pain is located in her lower back radiating into her right lower extremity anteriorly and laterally. She rates her pain 7. Her current exercise regime is walking for short distances with her walker and using the rowing machine five times a day at [facility] Also states she had bilateral knee injections by Dr. Maureen Ralphs week ago with good effect noted. Also states she has notice increase frequency of chest pain at times, she denies chest pain at this time encouraged to follow up with her cardiologist she verbalizes understanding. .  Pain Inventory Average Pain 7 Pain Right Now 7 My pain is constant, sharp, burning, stabbing and aching  In the last 24 hours, has pain interfered with the following? General activity 8 Relation with others 8 Enjoyment of life 9 What TIME of day is your pain at its worst? all Sleep (in general) Fair  Pain is worse with: walking, bending, standing and some activites Pain improves with: rest, heat/ice and medication Relief from Meds: 6  Mobility walk with assistance use a walker ability to climb steps?  no do you drive?  yes  Function retired I need assistance with the following:  household duties and shopping  Neuro/Psych bladder control problems weakness trouble walking spasms depression anxiety  Prior Studies Any changes since last visit?  no  Physicians involved in your care Any changes since last visit?  no   Family History  Problem Relation Age of Onset  . Arthritis Mother   . Kidney disease Mother   . Heart disease Father   . Stroke Father   . Hypertension Father   . Diabetes Father    History   Social History  . Marital Status: Widowed    Spouse Name: N/A  . Number of Children: N/A  .  Years of Education: N/A   Social History Main Topics  . Smoking status: Never Smoker   . Smokeless tobacco: Never Used     Comment: never used tobacco  . Alcohol Use: No  . Drug Use: No  . Sexual Activity: Not Currently   Other Topics Concern  . None   Social History Narrative   Widowed, 2 sons.  Relocated to [place] 09/2012 to be closer to her son who has MS.   Husband d 2015--mesothelioma.   Occupation: former Pharmacist, hospital.   Education: masters degree level.   No T/A/Ds.   Past Surgical History  Procedure Laterality Date  . Appendectomy  1960  . Total abdominal hysterectomy  1974  . Tendon release  1996    Right forearm and hand  . Knee surgery  2005  . Heel spur surgery Left 2008  . Plantar fascia release Left 2008  . Axillary surgery Left 1978    Multiple "lump" in armpit per pt  . Coccyx removal  1972  . Cardiac catheterization  01/2013    nonobstructive CAD, EF 55-60%  . Transthoracic echocardiogram  01/2013; 04/2014    2014--NORMAL.  2015--focal basal septal hypertrophy, EF 55-60%, grade I diast dysfxn, mild LAE.    . Dilation and curettage of uterus  ? 1970's  . Eye surgery Left 2012-2013    "injections for ~ 1 yr; don't really know what for" (07/12/2013)  . Spirometry  04/25/14    In hosp for acute asthma/COPD flare: mixed obstructive and restrictive lung disease. The FEV1 is severely reduced at 45% predicted.  FEV1 signif decreased compared to prior spirometry 07/23/13.  [name] Kitchen Esophagogastroduodenoscopy N/A 07/19/2014    Gastritis found + in the setting of supratherapeutic INR, +plavix, + meloxicam.  . Left heart catheterization with coronary angiogram N/A 01/30/2013    Procedure: LEFT HEART CATHETERIZATION WITH CORONARY ANGIOGRAM;  Surgeon: Clent Demark, MD;  Location: [facility] CATH LAB;  Service: Cardiovascular;  Laterality: N/A;  . Cardiovascular stress test  02/22/15    Low risk myocard perf imaging; wall motion normal, normal EF    BP 140/66 mmHg  Pulse 78  Resp 16  SpO2  99%  Opioid Risk Score:   Fall Risk Score: High Fall Risk (>13 points) (re educated and has been given handout)`1  Depression screen PHQ 2/9  Depression screen [facility] 2/9 03/27/2015 03/31/2014  Decreased Interest 3 0  Down, Depressed, Hopeless 3 0  PHQ - 2 Score 6 0  Altered sleeping 2 -  Tired, decreased energy 3 -  Change in appetite 2 -  Feeling bad or failure about yourself  2 -  Trouble concentrating 2 -  Moving slowly or fidgety/restless 0 -  Suicidal thoughts 0 -  PHQ-9 Score 17 -     Review of Systems  Constitutional: Positive for diaphoresis and unexpected weight change.  Respiratory: Positive for cough and shortness of breath.   Cardiovascular: Positive for chest pain and leg swelling.       No current chest pain  Gastrointestinal: Positive for nausea.  Musculoskeletal: Positive for gait problem.       Spasms  Psychiatric/Behavioral: Positive for dysphoric mood. The patient is nervous/anxious.   All other systems reviewed and are negative.      Objective:   Physical Exam  Constitutional: She is oriented to person, place, and time. She appears well-developed and well-nourished.  HENT:  Head: Normocephalic and atraumatic.  Neck: Normal range of motion. Neck supple.  Cardiovascular: Normal rate and regular rhythm.   Pulmonary/Chest: Effort normal and breath sounds normal.  Musculoskeletal:  Normal Muscle Bulk and Muscle Testing Reveals: Upper Extremities:Full ROM and Muscle Strength 5/5 Thoracic Tightness Lumbar Paraspinal Tenderness: L-3- L-5 Lower Extremities: Full ROM and Muscle Strength 5/5 Bilateral Lower Extremities Flexion Produces Pain into Patella's and Left Popliteal Fossa Arises from chair with ease/ Using cadillac walker for support Narrow based gait Narrow Based Gait   Neurological: She is alert and oriented to person, place, and time.  Skin: Skin is warm and dry.  Psychiatric: She has a normal mood and affect.  Nursing note and vitals  reviewed.         Assessment & Plan:  1. Functional deficits secondary to Gait disorder: Continue with HEP. 2. Chronic Back pain/fibromyalgia/R>L Knee OA Pain Management:  Refilled: Oxycodone 10/325 one tablet every 6 hours #100. Second script given to accommodate scheduled appointment.. Continue Lyrica and Voltaren Gel  3. Depression with anxiety/Grief reaction/Mood: Continue Trazodone and Cymbalta .  4. [name] with asthma: Oxygen dependent. Albuterol prn.  On Continuous Oxygen Therapy 3 liters/ Pulmonology Following. 5. Bilateral Osteoarthritis of Bilateral Knees/ Excruciating Pain S/P Bilateral Knee Injections via  Dr. Elmyra Ricks. 6. Fibromyalgia: Continue with exercise regime.  30 minutes of face to face patient care time was spent during this visit. All questions were encouraged and answered.

## 2015-06-02 DIAGNOSIS — J61 Pneumoconiosis due to asbestos and other mineral fibers: Secondary | ICD-10-CM | POA: Diagnosis not present

## 2015-06-02 DIAGNOSIS — G4733 Obstructive sleep apnea (adult) (pediatric): Secondary | ICD-10-CM | POA: Diagnosis not present

## 2015-06-02 DIAGNOSIS — R0902 Hypoxemia: Secondary | ICD-10-CM | POA: Diagnosis not present

## 2015-06-02 DIAGNOSIS — J984 Other disorders of lung: Secondary | ICD-10-CM | POA: Diagnosis not present

## 2015-06-02 DIAGNOSIS — J45909 Unspecified asthma, uncomplicated: Secondary | ICD-10-CM | POA: Diagnosis not present

## 2015-06-02 LAB — PMP ALCOHOL METABOLITE (ETG): Ethyl Glucuronide (EtG): NEGATIVE ng/mL

## 2015-06-05 ENCOUNTER — Ambulatory Visit (HOSPITAL_BASED_OUTPATIENT_CLINIC_OR_DEPARTMENT_OTHER): Payer: Medicare Other | Admitting: Family

## 2015-06-05 ENCOUNTER — Ambulatory Visit (HOSPITAL_BASED_OUTPATIENT_CLINIC_OR_DEPARTMENT_OTHER): Payer: Medicare Other

## 2015-06-05 ENCOUNTER — Encounter: Payer: Self-pay | Admitting: Family

## 2015-06-05 ENCOUNTER — Encounter: Payer: Self-pay | Admitting: Registered Nurse

## 2015-06-05 ENCOUNTER — Other Ambulatory Visit (HOSPITAL_BASED_OUTPATIENT_CLINIC_OR_DEPARTMENT_OTHER): Payer: Medicare Other

## 2015-06-05 VITALS — BP 124/67 | HR 72 | Temp 98.0°F | Resp 16 | Wt 171.0 lb

## 2015-06-05 DIAGNOSIS — G8929 Other chronic pain: Secondary | ICD-10-CM

## 2015-06-05 DIAGNOSIS — R5383 Other fatigue: Secondary | ICD-10-CM | POA: Diagnosis not present

## 2015-06-05 DIAGNOSIS — D510 Vitamin B12 deficiency anemia due to intrinsic factor deficiency: Secondary | ICD-10-CM | POA: Diagnosis not present

## 2015-06-05 DIAGNOSIS — I2699 Other pulmonary embolism without acute cor pulmonale: Secondary | ICD-10-CM

## 2015-06-05 DIAGNOSIS — D509 Iron deficiency anemia, unspecified: Secondary | ICD-10-CM

## 2015-06-05 DIAGNOSIS — G629 Polyneuropathy, unspecified: Secondary | ICD-10-CM | POA: Diagnosis not present

## 2015-06-05 DIAGNOSIS — M79605 Pain in left leg: Principal | ICD-10-CM

## 2015-06-05 DIAGNOSIS — M545 Low back pain: Secondary | ICD-10-CM

## 2015-06-05 DIAGNOSIS — D62 Acute posthemorrhagic anemia: Secondary | ICD-10-CM

## 2015-06-05 DIAGNOSIS — M79604 Pain in right leg: Secondary | ICD-10-CM

## 2015-06-05 LAB — CMP (CANCER CENTER ONLY)
ALBUMIN: 3.8 g/dL (ref 3.3–5.5)
ALK PHOS: 84 U/L (ref 26–84)
ALT(SGPT): 17 U/L (ref 10–47)
AST: 23 U/L (ref 11–38)
BILIRUBIN TOTAL: 1 mg/dL (ref 0.20–1.60)
BUN: 14 mg/dL (ref 7–22)
CO2: 28 mEq/L (ref 18–33)
Calcium: 9 mg/dL (ref 8.0–10.3)
Chloride: 103 mEq/L (ref 98–108)
Creat: 0.8 mg/dl (ref 0.6–1.2)
Glucose, Bld: 108 mg/dL (ref 73–118)
POTASSIUM: 3.9 meq/L (ref 3.3–4.7)
Sodium: 141 mEq/L (ref 128–145)
Total Protein: 7.5 g/dL (ref 6.4–8.1)

## 2015-06-05 LAB — CBC WITH DIFFERENTIAL (CANCER CENTER ONLY)
BASO#: 0 10*3/uL (ref 0.0–0.2)
BASO%: 0.1 % (ref 0.0–2.0)
EOS%: 0.3 % (ref 0.0–7.0)
Eosinophils Absolute: 0 10*3/uL (ref 0.0–0.5)
HCT: 40.8 % (ref 34.8–46.6)
HGB: 13.6 g/dL (ref 11.6–15.9)
LYMPH#: 2.4 10*3/uL (ref 0.9–3.3)
LYMPH%: 22.1 % (ref 14.0–48.0)
MCH: 30.5 pg (ref 26.0–34.0)
MCHC: 33.3 g/dL (ref 32.0–36.0)
MCV: 92 fL (ref 81–101)
MONO#: 0.7 10*3/uL (ref 0.1–0.9)
MONO%: 6.6 % (ref 0.0–13.0)
NEUT%: 70.9 % (ref 39.6–80.0)
NEUTROS ABS: 7.6 10*3/uL — AB (ref 1.5–6.5)
PLATELETS: 242 10*3/uL (ref 145–400)
RBC: 4.46 10*6/uL (ref 3.70–5.32)
RDW: 13.5 % (ref 11.1–15.7)
WBC: 10.8 10*3/uL — ABNORMAL HIGH (ref 3.9–10.0)

## 2015-06-05 LAB — BENZODIAZEPINES (GC/LC/MS), URINE
ALPRAZOLAMU: 1209 ng/mL (ref <25)
CLONAZEPAU: NEGATIVE ng/mL (ref <25)
Flurazepam metabolite (GC/LC/MS), ur confirm: NEGATIVE ng/mL (ref <50)
Lorazepam (GC/LC/MS), ur confirm: NEGATIVE ng/mL (ref <50)
Midazolam (GC/LC/MS), ur confirm: NEGATIVE ng/mL (ref <50)
NORDIAZEPAMU: NEGATIVE ng/mL (ref <50)
Oxazepam (GC/LC/MS), ur confirm: NEGATIVE ng/mL (ref <50)
TEMAZEPAMU: NEGATIVE ng/mL (ref <50)
Triazolam metabolite (GC/LC/MS), ur confirm: NEGATIVE ng/mL (ref <50)

## 2015-06-05 LAB — OPIATES/OPIOIDS (LC/MS-MS)
CODEINE URINE: NEGATIVE ng/mL (ref <50)
HYDROCODONE: NEGATIVE ng/mL (ref <50)
Hydromorphone: NEGATIVE ng/mL (ref <50)
MORPHINE: NEGATIVE ng/mL (ref <50)
Norhydrocodone, Ur: NEGATIVE ng/mL (ref <50)
Noroxycodone, Ur: 6555 ng/mL (ref <50)
OXYMORPHONE, URINE: 1904 ng/mL (ref <50)
Oxycodone, ur: 1669 ng/mL (ref <50)

## 2015-06-05 LAB — OXYCODONE, URINE (LC/MS-MS)
NOROXYCODONE, UR: 6555 ng/mL (ref <50)
OXYMORPHONE, URINE: 1904 ng/mL (ref <50)
Oxycodone, ur: 1669 ng/mL (ref <50)

## 2015-06-05 LAB — AMPHETAMINES (GC/LC/MS), URINE
Amphetamine GC/MS Conf: NEGATIVE ng/mL (ref <250)
Methamphetamine Quant, Ur: NEGATIVE ng/mL (ref <250)

## 2015-06-05 MED ORDER — CYANOCOBALAMIN 1000 MCG/ML IJ SOLN
INTRAMUSCULAR | Status: AC
  Filled 2015-06-05: qty 1

## 2015-06-05 MED ORDER — CYANOCOBALAMIN 1000 MCG/ML IJ SOLN
1000.0000 ug | Freq: Once | INTRAMUSCULAR | Status: AC
  Administered 2015-06-05: 1000 ug via INTRAMUSCULAR

## 2015-06-05 NOTE — Progress Notes (Signed)
Hematology and Oncology Follow Up Visit  Linda [name] [id number] 1946/03/14 69 y.o. 06/05/2015   Principle Diagnosis:  Idiopathic pulmonary embolism - resolved Recurrent iron deficiency anemia  Pernicious anemia   Current Therapy:   IV iron as indicated  Vitamin B12 1 mg IM every 2 weeks    Interim History: Linda [name] is here today for a follow-up. She is feeling fatigued. She is under a lot of stress since her husband passed. He was always the decision maker and now things have fallen to her and its overwhelming. She is planning to move to a bigger apartment with more windows and natural light. This will be good for her. She is looking forward to it.  She has had no fever, chills, n/v, cough, rash, headache, dizziness, palpitations, abdominal pain, diarrhea, blood in urine or stool. She takes Miralax to prevent constipation.  She states that she has had some chest pain and had to take nitroglycerin 4 times last week. She plans to follow-up with her PCP for a referral to a new cardiologist. She is not experiencing any chest pain or discomfort at this time.  She has been having a runny nose and congestion. She is going to try taking Claritin daily to help clear this up. She is on 3 L of oxygen and has chronic lung problems from asbestos exposure. She has SOB with any exertion. She now has a walker that holds her O2 tank. This makes it much easier for her to ambulate. She denies having any falls.   Her knees still bother her at times. She has fibromyalgia. She also has chronic back pain. No "boney pain." She has had some mild numbness and tingling in her hands and heels.  Her appetite is good and she is staying hydrated. Her weight is stable.   Medications:    Medication List       This list is accurate as of: 06/05/15  2:30 PM.  Always use your most recent med list.               albuterol 108 (90 BASE) MCG/ACT inhaler  Commonly known as:  PROVENTIL HFA;VENTOLIN HFA    Inhale 2 puffs into the lungs every 6 (six) hours as needed for wheezing or shortness of breath.     albuterol (2.5 MG/3ML) 0.083% nebulizer solution  Commonly known as:  PROVENTIL  Take 3 mLs (2.5 mg total) by nebulization every 6 (six) hours as needed for wheezing or shortness of breath.     ALPRAZolam 1 MG tablet  Commonly known as:  XANAX  TAKE 1 TABLET 3 TIMES A DAY.     ARIPiprazole 15 MG tablet  Commonly known as:  ABILIFY  Take 15 mg by mouth daily.     atorvastatin 20 MG tablet  Commonly known as:  LIPITOR  Take 1 tablet (20 mg total) by mouth daily at 6 PM.     budesonide-formoterol 160-4.5 MCG/ACT inhaler  Commonly known as:  SYMBICORT  Inhale 2 puffs into the lungs 2 (two) times daily.     diclofenac sodium 1 % Gel  Commonly known as:  VOLTAREN  Apply 4 g topically 3 (three) times daily. TO BOTH KNEES     DULoxetine 30 MG capsule  Commonly known as:  CYMBALTA  1 cap po qd to be combined with a 60mg  duloxetine cap     DULoxetine 60 MG capsule  Commonly known as:  CYMBALTA  1 cap po qd to be combined with  a 30 mg duloxetine cap daily     folic acid 1 MG tablet  Commonly known as:  FOLVITE  Take 2 tablets (2 mg total) by mouth daily.     isosorbide mononitrate 30 MG 24 hr tablet  Commonly known as:  IMDUR  Take 1 tablet (30 mg total) by mouth daily.     MAGNESIUM CARBONATE PO  Take 400 mg by mouth every morning.     nebivolol 5 MG tablet  Commonly known as:  BYSTOLIC  Take 1 tablet (5 mg total) by mouth daily.     ondansetron 4 MG tablet  Commonly known as:  ZOFRAN  Take 1 tablet (4 mg total) by mouth every 6 (six) hours.     oxyCODONE-acetaminophen 10-325 MG per tablet  Commonly known as:  PERCOCET  Take 1 tablet by mouth every 6 (six) hours as needed for pain. ONE MONTH SUPPLY     pantoprazole 40 MG tablet  Commonly known as:  PROTONIX  Take 1 tablet (40 mg total) by mouth daily.     polyethylene glycol powder powder  Commonly known as:   GLYCOLAX/MIRALAX  Take 17 g by mouth daily.     pregabalin 300 MG capsule  Commonly known as:  LYRICA  Take 1 capsule twice a day     traZODone 50 MG tablet  Commonly known as:  DESYREL  Take 3 tablets (150 mg total) by mouth at bedtime.     Vitamin D3 50000 UNITS Caps  Take 50,000 Units by mouth once a week.        Allergies:  Allergies  Allergen Reactions  . Oxycodone     Son asked [place] not to prescribe narcotics for patient due to patient dependence.  See 08/26/14 note  . Penicillins Itching, Swelling and Rash    Tolerated Cefepime in ED.    Past Medical History, Surgical history, Social history, and Family History were reviewed and updated.  Review of Systems: All other 10 point review of systems is negative.   Physical Exam:  weight is 171 lb (77.565 kg). Her oral temperature is 98 F (36.7 C). Her blood pressure is 124/67 and her pulse is 72. Her respiration is 16.   Wt Readings from Last 3 Encounters:  06/05/15 171 lb (77.565 kg)  05/29/15 171 lb (77.565 kg)  05/03/15 178 lb (80.74 kg)    Ocular: Sclerae unicteric, pupils equal, round and reactive to light Ear-nose-throat: Oropharynx clear, dentition fair Lymphatic: No cervical or supraclavicular adenopathy Lungs no rales or rhonchi, good excursion bilaterally Heart regular rate and rhythm, no murmur appreciated Abd soft, nontender, positive bowel sounds MSK no focal spinal tenderness, no joint edema Neuro: non-focal, well-oriented, appropriate affect Breasts: Deferred  Lab Results  Component Value Date   WBC 10.8* 06/05/2015   HGB 13.6 06/05/2015   HCT 40.8 06/05/2015   MCV 92 06/05/2015   PLT 242 06/05/2015   Lab Results  Component Value Date   FERRITIN 1,834* 01/24/2015   IRON 94 01/24/2015   TIBC 225* 01/24/2015   UIBC 132 01/24/2015   IRONPCTSAT 42 01/24/2015   Lab Results  Component Value Date   RETICCTPCT 1.3 10/24/2014   RBC 4.46 06/05/2015   RETICCTABS 56.2 10/24/2014   No results  found for: KPAFRELGTCHN, LAMBDASER, KAPLAMBRATIO No results found for: IGGSERUM, IGA, IGMSERUM No results found for: TOTALPROTELP, ALBUMINELP, A1GS, A2GS, BETS, BETA2SER, GAMS, MSPIKE, SPEI   Chemistry      Component Value Date/Time   NA 141  06/05/2015 1315   NA 143 10/24/2014 1148   K 3.9 06/05/2015 1315   K 4.1 10/24/2014 1148   CL 103 06/05/2015 1315   CL 106 10/24/2014 1148   CO2 28 06/05/2015 1315   CO2 29 10/24/2014 1148   BUN 14 06/05/2015 1315   BUN 10 10/24/2014 1148   CREATININE 0.8 06/05/2015 1315   CREATININE 0.89 10/24/2014 1148      Component Value Date/Time   CALCIUM 9.0 06/05/2015 1315   CALCIUM 9.1 10/24/2014 1148   ALKPHOS 84 06/05/2015 1315   ALKPHOS 90 10/24/2014 1148   AST 23 06/05/2015 1315   AST 15 10/24/2014 1148   ALT 17 06/05/2015 1315   ALT 11 10/24/2014 1148   BILITOT 1.00 06/05/2015 1315   BILITOT 0.3 10/24/2014 1148     Impression and Plan: Ms. Dedic is a 69 year old female with multiple medical problems. She has iron deficiency and pernicious anemia. She c/o fatigue and numbness/tingling in her hands and heels.  Her CBC and CMP today are ok. We will see what her iron studies show. If she happens to need Feraheme we will bring her back in later this week.  We will give her a vitamin B 12 injection today and increase them to every 2 weeks. Hopefully this will help with her fatigue and neuropathy.  We will see her back in 3 months for labs and follow-up.  She is in agreement with this and knows to call here with any questions or concerns. We can certainly see her back sooner if need be.   Eliezer Bottom, NP 6/13/20162:30 PM

## 2015-06-06 LAB — PRESCRIPTION MONITORING PROFILE (SOLSTAS)
Barbiturate Screen, Urine: NEGATIVE ng/mL
Buprenorphine, Urine: NEGATIVE ng/mL
Cannabinoid Scrn, Ur: NEGATIVE ng/mL
Carisoprodol, Urine: NEGATIVE ng/mL
Cocaine Metabolites: NEGATIVE ng/mL
Creatinine, Urine: 106.24 mg/dL (ref >20.0)
Fentanyl, Ur: NEGATIVE ng/mL
MDMA URINE: NEGATIVE ng/mL
Meperidine, Ur: NEGATIVE ng/mL
Methadone Screen, Urine: NEGATIVE ng/mL
Nitrites, Initial: NEGATIVE ug/mL
Propoxyphene: NEGATIVE ng/mL
Tapentadol, urine: NEGATIVE ng/mL
Tramadol Scrn, Ur: NEGATIVE ng/mL
Zolpidem, Urine: NEGATIVE ng/mL
pH, Initial: 5.4 pH (ref 4.5–8.9)

## 2015-06-06 LAB — IRON AND TIBC CHCC
%SAT: 41 % (ref 21–57)
Iron: 116 ug/dL (ref 41–142)
TIBC: 284 ug/dL (ref 236–444)
UIBC: 168 ug/dL (ref 120–384)

## 2015-06-06 LAB — FERRITIN CHCC

## 2015-06-07 NOTE — Progress Notes (Signed)
Urine drug screen for this encounter is consistent for prescribed medication 

## 2015-06-19 ENCOUNTER — Ambulatory Visit (HOSPITAL_BASED_OUTPATIENT_CLINIC_OR_DEPARTMENT_OTHER): Payer: Medicare Other

## 2015-06-19 VITALS — BP 137/71 | HR 79 | Temp 98.0°F | Resp 18

## 2015-06-19 DIAGNOSIS — D510 Vitamin B12 deficiency anemia due to intrinsic factor deficiency: Secondary | ICD-10-CM | POA: Diagnosis present

## 2015-06-19 DIAGNOSIS — D509 Iron deficiency anemia, unspecified: Secondary | ICD-10-CM

## 2015-06-19 DIAGNOSIS — I2699 Other pulmonary embolism without acute cor pulmonale: Secondary | ICD-10-CM

## 2015-06-19 MED ORDER — CYANOCOBALAMIN 1000 MCG/ML IJ SOLN
1000.0000 ug | Freq: Once | INTRAMUSCULAR | Status: AC
  Administered 2015-06-19: 1000 ug via INTRAMUSCULAR

## 2015-06-19 MED ORDER — CYANOCOBALAMIN 1000 MCG/ML IJ SOLN
INTRAMUSCULAR | Status: AC
Start: 2015-06-19 — End: 2015-06-19
  Filled 2015-06-19: qty 1

## 2015-06-19 NOTE — Patient Instructions (Signed)

## 2015-06-23 DIAGNOSIS — G4733 Obstructive sleep apnea (adult) (pediatric): Secondary | ICD-10-CM | POA: Diagnosis not present

## 2015-06-27 ENCOUNTER — Other Ambulatory Visit: Payer: Self-pay | Admitting: *Deleted

## 2015-06-27 NOTE — Telephone Encounter (Signed)
RF request for trazodone.  LOV: 05/29/15 Next ov: 06/29/15 Last written: 09/16/15 #90 w/ 3OV Please advise. Thanks.

## 2015-06-29 ENCOUNTER — Ambulatory Visit: Payer: Medicare Other | Admitting: Family Medicine

## 2015-06-29 MED ORDER — TRAZODONE HCL 50 MG PO TABS: 150.0000 mg | ORAL_TABLET | Freq: Every day | ORAL | Status: DC

## 2015-07-03 ENCOUNTER — Ambulatory Visit: Payer: Medicare Other

## 2015-07-03 ENCOUNTER — Ambulatory Visit: Payer: Medicare Other | Admitting: Family Medicine

## 2015-07-07 ENCOUNTER — Ambulatory Visit (INDEPENDENT_AMBULATORY_CARE_PROVIDER_SITE_OTHER): Payer: Medicare Other | Admitting: Family Medicine

## 2015-07-07 ENCOUNTER — Encounter: Payer: Self-pay | Admitting: Family Medicine

## 2015-07-07 ENCOUNTER — Ambulatory Visit (HOSPITAL_BASED_OUTPATIENT_CLINIC_OR_DEPARTMENT_OTHER): Payer: Medicare Other

## 2015-07-07 ENCOUNTER — Other Ambulatory Visit: Payer: Self-pay | Admitting: Family Medicine

## 2015-07-07 VITALS — BP 108/70 | HR 81 | Temp 97.9°F | Resp 16 | Ht 63.0 in | Wt 173.0 lb

## 2015-07-07 VITALS — BP 142/63 | HR 72 | Temp 97.8°F | Resp 18

## 2015-07-07 DIAGNOSIS — F4320 Adjustment disorder, unspecified: Secondary | ICD-10-CM

## 2015-07-07 DIAGNOSIS — R300 Dysuria: Secondary | ICD-10-CM

## 2015-07-07 DIAGNOSIS — F419 Anxiety disorder, unspecified: Secondary | ICD-10-CM

## 2015-07-07 DIAGNOSIS — Z1231 Encounter for screening mammogram for malignant neoplasm of breast: Secondary | ICD-10-CM

## 2015-07-07 DIAGNOSIS — N3000 Acute cystitis without hematuria: Secondary | ICD-10-CM

## 2015-07-07 DIAGNOSIS — I10 Essential (primary) hypertension: Secondary | ICD-10-CM

## 2015-07-07 DIAGNOSIS — D510 Vitamin B12 deficiency anemia due to intrinsic factor deficiency: Secondary | ICD-10-CM | POA: Diagnosis present

## 2015-07-07 DIAGNOSIS — F329 Major depressive disorder, single episode, unspecified: Secondary | ICD-10-CM

## 2015-07-07 DIAGNOSIS — F418 Other specified anxiety disorders: Secondary | ICD-10-CM

## 2015-07-07 DIAGNOSIS — F32A Depression, unspecified: Secondary | ICD-10-CM

## 2015-07-07 DIAGNOSIS — N390 Urinary tract infection, site not specified: Secondary | ICD-10-CM | POA: Diagnosis not present

## 2015-07-07 DIAGNOSIS — D509 Iron deficiency anemia, unspecified: Secondary | ICD-10-CM

## 2015-07-07 DIAGNOSIS — F332 Major depressive disorder, recurrent severe without psychotic features: Secondary | ICD-10-CM

## 2015-07-07 DIAGNOSIS — I2699 Other pulmonary embolism without acute cor pulmonale: Secondary | ICD-10-CM

## 2015-07-07 LAB — POCT URINALYSIS DIPSTICK
BILIRUBIN UA: NEGATIVE
Blood, UA: NEGATIVE
Glucose, UA: NEGATIVE
Ketones, UA: NEGATIVE
Nitrite, UA: NEGATIVE
PH UA: 6
Protein, UA: NEGATIVE
Spec Grav, UA: 1.02
Urobilinogen, UA: 0.2

## 2015-07-07 MED ORDER — CYANOCOBALAMIN 1000 MCG/ML IJ SOLN
INTRAMUSCULAR | Status: AC
  Filled 2015-07-07: qty 1

## 2015-07-07 MED ORDER — ARIPIPRAZOLE 15 MG PO TABS: 15.0000 mg | ORAL_TABLET | Freq: Every day | ORAL | Status: DC

## 2015-07-07 MED ORDER — SULFAMETHOXAZOLE-TRIMETHOPRIM 800-160 MG PO TABS: 1.0000 | ORAL_TABLET | Freq: Two times a day (BID) | ORAL | Status: DC

## 2015-07-07 MED ORDER — CYANOCOBALAMIN 1000 MCG/ML IJ SOLN
1000.0000 ug | Freq: Once | INTRAMUSCULAR | Status: AC
  Administered 2015-07-07: 1000 ug via INTRAMUSCULAR

## 2015-07-07 NOTE — Progress Notes (Signed)
Pre visit review using our clinic review tool, if applicable. No additional management support is needed unless otherwise documented below in the visit note. 

## 2015-07-07 NOTE — Progress Notes (Signed)
OFFICE VISIT  07/07/2015   CC:  Chief Complaint  Patient presents with  . Follow-up    f/u for depression  . Urinary Tract Infection    x 10 days   HPI:    Patient is a 69 y.o. Caucasian female who presents for 5 week f/u major depression + anxiety+ abnormal grief reaction.  She reports some improvement in mood, easier to lift herself out of feelings of dread/loss. No adverse effects felt from abilify, which she is taking at a dose of 1/2 of 15mg  tab once daily.  Today she also reports having dysuria, urinary urgency and frequency, and foul smelling urine x 10d.  Monitors home bp: bp's seem stable at home. She has appt with Dr. Terrence Dupont coming up in August, also f/u with Dr. Ricki Rodriguez in August.  Has lots more probs with knees lately.  She had a sleep study, has f/u with Dr. Camillo Flaming 07/12/15 for results/discussion.  Pertinent PMH: Past medical, surgical, social, and family history reviewed and no changes are noted since last office visit.   Past Surgical History  Procedure Laterality Date  . Appendectomy  1960  . Total abdominal hysterectomy  1974  . Tendon release  1996    Right forearm and hand  . Knee surgery  2005  . Heel spur surgery Left 2008  . Plantar fascia release Left 2008  . Axillary surgery Left 1978    Multiple "lump" in armpit per pt  . Coccyx removal  1972  . Cardiac catheterization  01/2013    nonobstructive CAD, EF 55-60%  . Transthoracic echocardiogram  01/2013; 04/2014    2014--NORMAL.  2015--focal basal septal hypertrophy, EF 55-60%, grade I diast dysfxn, mild LAE.    . Dilation and curettage of uterus  ? 1970's  . Eye surgery Left 2012-2013    "injections for ~ 1 yr; don't really know what for" (07/12/2013)  . Spirometry  04/25/14    In hosp for acute asthma/COPD flare: mixed obstructive and restrictive lung disease. The FEV1 is severely reduced at 45% predicted.  FEV1 signif decreased compared to prior spirometry 07/23/13.  [name] Kitchen Esophagogastroduodenoscopy N/A  07/19/2014    Gastritis found + in the setting of supratherapeutic INR, +plavix, + meloxicam.  . Left heart catheterization with coronary angiogram N/A 01/30/2013    Procedure: LEFT HEART CATHETERIZATION WITH CORONARY ANGIOGRAM;  Surgeon: Clent Demark, MD;  Location: [facility] CATH LAB;  Service: Cardiovascular;  Laterality: N/A;  . Cardiovascular stress test  02/22/15    Low risk myocard perf imaging; wall motion normal, normal EF    Outpatient Prescriptions Prior to Visit  Medication Sig Dispense Refill  . albuterol (PROVENTIL HFA;VENTOLIN HFA) 108 (90 BASE) MCG/ACT inhaler Inhale 2 puffs into the lungs every 6 (six) hours as needed for wheezing or shortness of breath. 1 Inhaler 1  . albuterol (PROVENTIL) (2.5 MG/3ML) 0.083% nebulizer solution Take 3 mLs (2.5 mg total) by nebulization every 6 (six) hours as needed for wheezing or shortness of breath. 75 mL 1  . ALPRAZolam (XANAX) 1 MG tablet TAKE 1 TABLET 3 TIMES A DAY. 90 tablet 5  . atorvastatin (LIPITOR) 20 MG tablet Take 1 tablet (20 mg total) by mouth daily at 6 PM. 90 tablet 1  . budesonide-formoterol (SYMBICORT) 160-4.5 MCG/ACT inhaler Inhale 2 puffs into the lungs 2 (two) times daily. 1 Inhaler 12  . Cholecalciferol (VITAMIN D3) 50000 UNITS CAPS Take 50,000 Units by mouth once a week. 12 capsule 0  . diclofenac sodium (VOLTAREN)  1 % GEL Apply 4 g topically 3 (three) times daily. TO BOTH KNEES 100 g 6  . DULoxetine (CYMBALTA) 30 MG capsule 1 cap po qd to be combined with a 60mg  duloxetine cap 30 capsule 6  . DULoxetine (CYMBALTA) 60 MG capsule 1 cap po qd to be combined with a 30 mg duloxetine cap daily 90 capsule 1  . folic acid (FOLVITE) 1 MG tablet Take 2 tablets (2 mg total) by mouth daily. 60 tablet 6  . isosorbide mononitrate (IMDUR) 30 MG 24 hr tablet Take 1 tablet (30 mg total) by mouth daily. 90 tablet 1  . MAGNESIUM CARBONATE PO Take 400 mg by mouth every morning.    . nebivolol (BYSTOLIC) 5 MG tablet Take 1 tablet (5 mg total) by  mouth daily. 30 tablet 6  . ondansetron (ZOFRAN) 4 MG tablet Take 1 tablet (4 mg total) by mouth every 6 (six) hours. 12 tablet 0  . oxyCODONE-acetaminophen (PERCOCET) 10-325 MG per tablet Take 1 tablet by mouth every 6 (six) hours as needed for pain. ONE MONTH SUPPLY 100 tablet 0  . pantoprazole (PROTONIX) 40 MG tablet Take 1 tablet (40 mg total) by mouth daily. 90 tablet 1  . polyethylene glycol powder (GLYCOLAX/MIRALAX) powder Take 17 g by mouth daily. 3350 g 6  . pregabalin (LYRICA) 300 MG capsule Take 1 capsule twice a day 60 capsule 5  . traZODone (DESYREL) 50 MG tablet Take 3 tablets (150 mg total) by mouth at bedtime. 90 tablet 6  . ARIPiprazole (ABILIFY) 15 MG tablet Take 15 mg by mouth daily.      Facility-Administered Medications Prior to Visit  Medication Dose Route Frequency Provider Last Rate Last Dose  . ferumoxytol (FERAHEME) 510 mg in sodium chloride 0.9 % 100 mL IVPB  510 mg Intravenous Once Volanda Napoleon, MD        Allergies  Allergen Reactions  . Oxycodone     Son asked [place] not to prescribe narcotics for patient due to patient dependence.  See 08/26/14 note  . Penicillins Itching, Swelling and Rash    Tolerated Cefepime in ED.    ROS As per HPI  PE: Blood pressure 108/70, pulse 81, temperature 97.9 F (36.6 C), temperature source Oral, resp. rate 16, height 5\' 3"  (1.6 m), weight 173 lb (78.472 kg), SpO2 92 %. Gen: alert, calm, oriented x 4.  Lucid thought and speech. No further exam today.  LABS:  CC UA today: trace LEU   Chemistry      Component Value Date/Time   NA 141 06/05/2015 1315   NA 143 10/24/2014 1148   K 3.9 06/05/2015 1315   K 4.1 10/24/2014 1148   CL 103 06/05/2015 1315   CL 106 10/24/2014 1148   CO2 28 06/05/2015 1315   CO2 29 10/24/2014 1148   BUN 14 06/05/2015 1315   BUN 10 10/24/2014 1148   CREATININE 0.8 06/05/2015 1315   CREATININE 0.89 10/24/2014 1148      Component Value Date/Time   CALCIUM 9.0 06/05/2015 1315   CALCIUM 9.1  10/24/2014 1148   ALKPHOS 84 06/05/2015 1315   ALKPHOS 90 10/24/2014 1148   AST 23 06/05/2015 1315   AST 15 10/24/2014 1148   ALT 17 06/05/2015 1315   ALT 11 10/24/2014 1148   BILITOT 1.00 06/05/2015 1315   BILITOT 0.3 10/24/2014 1148     Lab Results  Component Value Date   WBC 10.8* 06/05/2015   HGB 13.6 06/05/2015   HCT  40.8 06/05/2015   MCV 92 06/05/2015   PLT 242 06/05/2015    IMPRESSION AND PLAN:  1) Recurrent major depression, severe, without psychosis. Improving some lately!  Increase abilify to 15mg  per day and continue cymbalta 90 mg daily. No sign of serotonin excess at this time.  2) HTN; The current medical regimen is effective;  continue present plan and medications.  3) UTI: bactrim DS 1 bid x 5d. Hx of recurrent problems with this.  If clx + this time, will start daily prophylactic antibiotic dose.  4) OSA; just got another sleep study and has f/u with pulm (Dr. Camillo Flaming) soon to discuss the results.  An After Visit Summary was printed and given to the patient.    FOLLOW UP: Return in about 2 months (around 09/07/2015) for f/u dep and recurrent UTI.

## 2015-07-12 ENCOUNTER — Encounter: Payer: Medicare Other | Attending: Physical Medicine & Rehabilitation | Admitting: Registered Nurse

## 2015-07-12 ENCOUNTER — Encounter: Payer: Self-pay | Admitting: Registered Nurse

## 2015-07-12 VITALS — BP 105/52 | HR 70 | Resp 16

## 2015-07-12 DIAGNOSIS — M5440 Lumbago with sciatica, unspecified side: Secondary | ICD-10-CM | POA: Diagnosis not present

## 2015-07-12 DIAGNOSIS — Z79899 Other long term (current) drug therapy: Secondary | ICD-10-CM | POA: Diagnosis not present

## 2015-07-12 DIAGNOSIS — Z5181 Encounter for therapeutic drug level monitoring: Secondary | ICD-10-CM | POA: Insufficient documentation

## 2015-07-12 DIAGNOSIS — R2689 Other abnormalities of gait and mobility: Secondary | ICD-10-CM | POA: Diagnosis not present

## 2015-07-12 DIAGNOSIS — M1711 Unilateral primary osteoarthritis, right knee: Secondary | ICD-10-CM

## 2015-07-12 DIAGNOSIS — M797 Fibromyalgia: Secondary | ICD-10-CM | POA: Insufficient documentation

## 2015-07-12 DIAGNOSIS — G894 Chronic pain syndrome: Secondary | ICD-10-CM

## 2015-07-12 DIAGNOSIS — M1712 Unilateral primary osteoarthritis, left knee: Secondary | ICD-10-CM | POA: Diagnosis not present

## 2015-07-12 MED ORDER — OXYCODONE-ACETAMINOPHEN 10-325 MG PO TABS: 1.0000 | ORAL_TABLET | Freq: Four times a day (QID) | ORAL | Status: DC | PRN

## 2015-07-12 NOTE — Progress Notes (Signed)
Subjective:    Patient ID: [name], female    DOB: Oct 08, 1946, 69 y.o.   MRN: [id number]  HPI: Ms. RAYANNA MATUSIK is a 69 year old female who returns for follow up for chronic pain and medication refill. She says her pain is located in her lower back radiating into her  lower extremities anteriorly and laterally and bilateral knees.. She rates her pain 9. Her current exercise regime is walking for short distances with her walker and using the [employer] 3-4 times a week at [employer]. Also states she spilled coffee on the floor while sitting on the couch she leaned over to  clean this morning and lost her position and ended on the floor. Her son and neighbor helped her up. She didn't seek medical attention. She's going on cruise 09/20/15- 09/24/2015.  Pain Inventory Average Pain 10 Pain Right Now 9 My pain is constant, sharp, stabbing and aching  In the last 24 hours, has pain interfered with the following? General activity 10 Relation with others 10 Enjoyment of life 10 What TIME of day is your pain at its worst? all Sleep (in general) Poor  Pain is worse with: walking, bending, sitting, standing and some activites Pain improves with: medication Relief from Meds: 5  Mobility use a walker how many minutes can you walk? 5 ability to climb steps?  no do you drive?  yes  Function disabled: date disabled 1999 retired I need assistance with the following:  meal prep, household duties and shopping  Neuro/Psych bladder control problems weakness trouble walking depression anxiety  Prior Studies Any changes since last visit?  no  Physicians involved in your care Any changes since last visit?  no   Family History  Problem Relation Age of Onset  . Arthritis Mother   . Kidney disease Mother   . Heart disease Father   . Stroke Father   . Hypertension Father   . Diabetes Father    History   Social History  . Marital Status: Widowed    Spouse Name: N/A    . Number of Children: N/A  . Years of Education: N/A   Social History Main Topics  . Smoking status: Never Smoker   . Smokeless tobacco: Never Used     Comment: never used tobacco  . Alcohol Use: No  . Drug Use: No  . Sexual Activity: Not Currently   Other Topics Concern  . None   Social History Narrative   Widowed, 2 sons.  Relocated to [place] 09/2012 to be closer to her son who has MS.   Husband d 2015--mesothelioma.   Occupation: former Pharmacist, hospital.   Education: masters degree level.   No T/A/Ds.   Past Surgical History  Procedure Laterality Date  . Appendectomy  1960  . Total abdominal hysterectomy  1974  . Tendon release  1996    Right forearm and hand  . Knee surgery  2005  . Heel spur surgery Left 2008  . Plantar fascia release Left 2008  . Axillary surgery Left 1978    Multiple "lump" in armpit per pt  . Coccyx removal  1972  . Cardiac catheterization  01/2013    nonobstructive CAD, EF 55-60%  . Transthoracic echocardiogram  01/2013; 04/2014    2014--NORMAL.  2015--focal basal septal hypertrophy, EF 55-60%, grade I diast dysfxn, mild LAE.    . Dilation and curettage of uterus  ? 1970's  . Eye surgery Left 2012-2013    "injections for ~  1 yr; don't really know what for" (07/12/2013)  . Spirometry  04/25/14    In hosp for acute asthma/COPD flare: mixed obstructive and restrictive lung disease. The FEV1 is severely reduced at 45% predicted.  FEV1 signif decreased compared to prior spirometry 07/23/13.  [name] Kitchen Esophagogastroduodenoscopy N/A 07/19/2014    Gastritis found + in the setting of supratherapeutic INR, +plavix, + meloxicam.  . Left heart catheterization with coronary angiogram N/A 01/30/2013    Procedure: LEFT HEART CATHETERIZATION WITH CORONARY ANGIOGRAM;  Surgeon: Clent Demark, MD;  Location: [facility] CATH LAB;  Service: Cardiovascular;  Laterality: N/A;  . Cardiovascular stress test  02/22/15    Low risk myocard perf imaging; wall motion normal, normal EF    BP 105/52 mmHg   Pulse 70  Resp 16  SpO2 92%  Opioid Risk Score:   Fall Risk Score:  `1  Depression screen PHQ 2/9  Depression screen [place] 2/9 07/12/2015 03/27/2015 03/31/2014  Decreased Interest 0 3 0  Down, Depressed, Hopeless 1 3 0  PHQ - 2 Score 1 6 0  Altered sleeping - 2 -  Tired, decreased energy - 3 -  Change in appetite - 2 -  Feeling bad or failure about yourself  - 2 -  Trouble concentrating - 2 -  Moving slowly or fidgety/restless - 0 -  Suicidal thoughts - 0 -  PHQ-9 Score - 17 -    Review of Systems  Constitutional: Positive for diaphoresis and unexpected weight change.  Genitourinary:       Bladder control problems  Musculoskeletal: Positive for gait problem.  Neurological: Positive for weakness.  Psychiatric/Behavioral: Positive for dysphoric mood. The patient is nervous/anxious.   All other systems reviewed and are negative.      Objective:   Physical Exam  Constitutional: She is oriented to person, place, and time. She appears well-developed and well-nourished.  HENT:  Head: Normocephalic and atraumatic.  Neck: Normal range of motion. Neck supple.  Cardiovascular: Normal rate and regular rhythm.   Pulmonary/Chest: Effort normal and breath sounds normal.  Musculoskeletal:  Normal Muscle Bulk and Muscle Testing Reveals: Upper Extremities: Full ROM and Muscle Strength 5/5 Lumbar Paraspinal Tenderness: L-3- L-5 Lower Extremities: Full ROM and Muscle Strength 5/5 Bilateral Lower Extremity Flexion Produces Pain into Patella's Transfer to wheelchair staff escorted to lobby  Neurological: She is alert and oriented to person, place, and time.  Skin: Skin is warm and dry.  Psychiatric: She has a normal mood and affect.  Nursing note and vitals reviewed.         Assessment & Plan:  1. Functional deficits secondary to Gait disorder: Continue with HEP. 2. Chronic Back pain/fibromyalgia/R>L Knee OA Pain Management:  Refilled: Oxycodone 10/325 one tablet every 6 hours  #100.  Continue Lyrica and Voltaren Gel  3. Depression with anxiety/Grief reaction/Mood: Continue Trazodone and Cymbalta .  4. [name] with asthma: Oxygen dependent. Albuterol prn.  On Continuous Oxygen Therapy 3 liters/ Pulmonology Following. 5. Bilateral Osteoarthritis of Bilateral Knees/  Dr. Elmyra Ricks Following. 6. Fibromyalgia: Continue with exercise regime.  20 minutes of face to face patient care time was spent during this visit. All questions were encouraged and answered.

## 2015-07-13 DIAGNOSIS — R05 Cough: Secondary | ICD-10-CM | POA: Diagnosis not present

## 2015-07-13 DIAGNOSIS — J45909 Unspecified asthma, uncomplicated: Secondary | ICD-10-CM | POA: Diagnosis not present

## 2015-07-13 DIAGNOSIS — J61 Pneumoconiosis due to asbestos and other mineral fibers: Secondary | ICD-10-CM | POA: Diagnosis not present

## 2015-07-13 DIAGNOSIS — R0902 Hypoxemia: Secondary | ICD-10-CM | POA: Diagnosis not present

## 2015-07-13 DIAGNOSIS — G4733 Obstructive sleep apnea (adult) (pediatric): Secondary | ICD-10-CM | POA: Diagnosis not present

## 2015-07-13 LAB — URINE CULTURE: Colony Count: >=100000

## 2015-07-17 ENCOUNTER — Ambulatory Visit (HOSPITAL_BASED_OUTPATIENT_CLINIC_OR_DEPARTMENT_OTHER): Payer: Medicare Other

## 2015-07-17 ENCOUNTER — Other Ambulatory Visit: Payer: Self-pay | Admitting: Family

## 2015-07-17 VITALS — BP 106/57 | HR 69 | Temp 98.0°F | Resp 18 | Ht 63.0 in | Wt 172.0 lb

## 2015-07-17 DIAGNOSIS — M9979 Connective tissue and disc stenosis of intervertebral foramina of abdomen and other regions: Secondary | ICD-10-CM | POA: Insufficient documentation

## 2015-07-17 DIAGNOSIS — I2699 Other pulmonary embolism without acute cor pulmonale: Secondary | ICD-10-CM

## 2015-07-17 DIAGNOSIS — D510 Vitamin B12 deficiency anemia due to intrinsic factor deficiency: Secondary | ICD-10-CM | POA: Diagnosis present

## 2015-07-17 DIAGNOSIS — D509 Iron deficiency anemia, unspecified: Secondary | ICD-10-CM

## 2015-07-17 MED ORDER — CYANOCOBALAMIN 1000 MCG/ML IJ SOLN
INTRAMUSCULAR | Status: AC
  Filled 2015-07-17: qty 1

## 2015-07-17 MED ORDER — CYANOCOBALAMIN 1000 MCG/ML IJ SOLN
1000.0000 ug | Freq: Once | INTRAMUSCULAR | Status: AC
  Administered 2015-07-17: 1000 ug via INTRAMUSCULAR

## 2015-07-17 NOTE — Patient Instructions (Signed)

## 2015-07-26 ENCOUNTER — Other Ambulatory Visit: Payer: Self-pay | Admitting: *Deleted

## 2015-07-26 ENCOUNTER — Ambulatory Visit (INDEPENDENT_AMBULATORY_CARE_PROVIDER_SITE_OTHER): Payer: Medicare Other | Admitting: Family Medicine

## 2015-07-26 ENCOUNTER — Encounter: Payer: Self-pay | Admitting: Family Medicine

## 2015-07-26 VITALS — BP 96/61 | HR 77 | Temp 98.4°F

## 2015-07-26 DIAGNOSIS — N3000 Acute cystitis without hematuria: Secondary | ICD-10-CM

## 2015-07-26 DIAGNOSIS — N390 Urinary tract infection, site not specified: Secondary | ICD-10-CM | POA: Diagnosis not present

## 2015-07-26 MED ORDER — SULFAMETHOXAZOLE-TRIMETHOPRIM 800-160 MG PO TABS: 1.0000 | ORAL_TABLET | Freq: Every day | ORAL | Status: DC

## 2015-07-26 MED ORDER — CEPHALEXIN 500 MG PO CAPS: 500.0000 mg | ORAL_CAPSULE | Freq: Three times a day (TID) | ORAL | Status: DC

## 2015-07-26 NOTE — Addendum Note (Signed)
Addended by: Lanae Crumbly on: 07/26/2015 03:56 PM   Modules accepted: Orders

## 2015-07-26 NOTE — Progress Notes (Signed)
OFFICE NOTE  07/26/2015  CC:  Chief Complaint  Patient presents with  . Cystitis    finished abx yesterday started maintenece dose & bladder now spasm & hurts, legs hurting real bad & feet & ankles are swollen badly, sob   HPI: Patient is a 69 y.o. Caucasian female who is here for suspicion of having a bladder infection. She had + urine clx for S. Bovis (group D) on 07/07/15, sensitive to penicillin, the only antibiotic tested. I had rx'd her bactrim DS bid x 5d when I saw her for UTI sx's right before getting this culture. We have already had a discussion about getting on a once daily prophylactic dose of antibiotic b/c of the frequency of her UTIs.  Has just started bactrim DS maintenance yesterday.   Her sx's from recent UTI never completely cleared up, then when stopped abx, sx's got worse: frequency, urgency, bladder spasms/aching, foul smelling urine.  No dysuria.  No fevers.  +Nausea w/out vomiting.    ROS: chronic HA, chronic SOB, chronic LE edema that waxes and wanes  Pertinent PMH:  Past medical, surgical, social, and family history reviewed and no changes are noted since last office visit.  MEDS:  Outpatient Prescriptions Prior to Visit  Medication Sig Dispense Refill  . albuterol (PROVENTIL HFA;VENTOLIN HFA) 108 (90 BASE) MCG/ACT inhaler Inhale 2 puffs into the lungs every 6 (six) hours as needed for wheezing or shortness of breath. 1 Inhaler 1  . albuterol (PROVENTIL) (2.5 MG/3ML) 0.083% nebulizer solution Take 3 mLs (2.5 mg total) by nebulization every 6 (six) hours as needed for wheezing or shortness of breath. 75 mL 1  . ALPRAZolam (XANAX) 1 MG tablet TAKE 1 TABLET 3 TIMES A DAY. 90 tablet 5  . ARIPiprazole (ABILIFY) 15 MG tablet Take 1 tablet (15 mg total) by mouth daily. 30 tablet 6  . atorvastatin (LIPITOR) 20 MG tablet Take 1 tablet (20 mg total) by mouth daily at 6 PM. 90 tablet 1  . budesonide-formoterol (SYMBICORT) 160-4.5 MCG/ACT inhaler Inhale 2 puffs into the  lungs 2 (two) times daily. 1 Inhaler 12  . Cholecalciferol (VITAMIN D3) 50000 UNITS CAPS Take 50,000 Units by mouth once a week. 12 capsule 0  . diclofenac sodium (VOLTAREN) 1 % GEL Apply 4 g topically 3 (three) times daily. TO BOTH KNEES 100 g 6  . DULoxetine (CYMBALTA) 30 MG capsule 1 cap po qd to be combined with a 60mg  duloxetine cap 30 capsule 6  . DULoxetine (CYMBALTA) 60 MG capsule 1 cap po qd to be combined with a 30 mg duloxetine cap daily 90 capsule 1  . folic acid (FOLVITE) 1 MG tablet Take 2 tablets (2 mg total) by mouth daily. 60 tablet 6  . isosorbide mononitrate (IMDUR) 30 MG 24 hr tablet Take 1 tablet (30 mg total) by mouth daily. 90 tablet 1  . MAGNESIUM CARBONATE PO Take 400 mg by mouth every morning.    . nebivolol (BYSTOLIC) 5 MG tablet Take 1 tablet (5 mg total) by mouth daily. 30 tablet 6  . ondansetron (ZOFRAN) 4 MG tablet Take 1 tablet (4 mg total) by mouth every 6 (six) hours. 12 tablet 0  . oxyCODONE-acetaminophen (PERCOCET) 10-325 MG per tablet Take 1 tablet by mouth every 6 (six) hours as needed for pain. ONE MONTH SUPPLY 100 tablet 0  . pantoprazole (PROTONIX) 40 MG tablet Take 1 tablet (40 mg total) by mouth daily. 90 tablet 1  . polyethylene glycol powder (GLYCOLAX/MIRALAX) powder Take 17  g by mouth daily. 3350 g 6  . pregabalin (LYRICA) 300 MG capsule Take 1 capsule twice a day 60 capsule 5  . sulfamethoxazole-trimethoprim (BACTRIM DS,SEPTRA DS) 800-160 MG per tablet Take 1 tablet by mouth 2 (two) times daily. 10 tablet 0  . traZODone (DESYREL) 50 MG tablet Take 3 tablets (150 mg total) by mouth at bedtime. 90 tablet 6   Facility-Administered Medications Prior to Visit  Medication Dose Route Frequency Provider Last Rate Last Dose  . ferumoxytol (FERAHEME) 510 mg in sodium chloride 0.9 % 100 mL IVPB  510 mg Intravenous Once Volanda Napoleon, MD        PE: Blood pressure 96/61, pulse 77, temperature 98.4 F (36.9 C), temperature source Oral, SpO2 97 %. Gen:  alert, oriented x 4, sitting up in wheelchair.  NAD. LEX:NTZG: no injection, icteris, swelling, or exudate.  EOMI, PERRLA. Mouth: lips without lesion/swelling.  Oral mucosa pink and moist. Oropharynx without erythema, exudate, or swelling.  CV: RRR, distant S1 and S2, no audible m/r/g. Chest is clear, no wheezing or rales. Normal symmetric air entry throughout both lung fields. No chest wall deformities or tenderness. EXT: trace pitting edema in both LL's but also mild "doughy" type lymphedema feel to her soft tissues of LL's.  No rash.  No erythema or mass.  LAB: none (pt unable to give a urine sample today)  IMPRESSION AND PLAN:  1) UTI, persistent.  This is in a pt with recurrent UTIs. Hold daily bactrim prophylaxis; start cephalexin 500 mg tid x 7d (her prior 3 UTI clx showed e coli -pansensitive).  An After Visit Summary was printed and given to the patient.  FOLLOW UP: prn

## 2015-07-31 ENCOUNTER — Ambulatory Visit (HOSPITAL_BASED_OUTPATIENT_CLINIC_OR_DEPARTMENT_OTHER): Payer: Medicare Other

## 2015-07-31 VITALS — BP 110/62 | HR 72 | Temp 97.8°F

## 2015-07-31 DIAGNOSIS — D509 Iron deficiency anemia, unspecified: Secondary | ICD-10-CM

## 2015-07-31 DIAGNOSIS — D510 Vitamin B12 deficiency anemia due to intrinsic factor deficiency: Secondary | ICD-10-CM

## 2015-07-31 DIAGNOSIS — I2699 Other pulmonary embolism without acute cor pulmonale: Secondary | ICD-10-CM

## 2015-07-31 MED ORDER — CYANOCOBALAMIN 1000 MCG/ML IJ SOLN
INTRAMUSCULAR | Status: AC
  Filled 2015-07-31: qty 1

## 2015-07-31 MED ORDER — CYANOCOBALAMIN 1000 MCG/ML IJ SOLN
1000.0000 ug | Freq: Once | INTRAMUSCULAR | Status: AC
  Administered 2015-07-31: 1000 ug via INTRAMUSCULAR

## 2015-07-31 NOTE — Patient Instructions (Signed)

## 2015-08-06 DIAGNOSIS — Z23 Encounter for immunization: Secondary | ICD-10-CM | POA: Diagnosis not present

## 2015-08-07 ENCOUNTER — Telehealth: Payer: Self-pay | Admitting: Family Medicine

## 2015-08-07 DIAGNOSIS — I4510 Unspecified right bundle-branch block: Secondary | ICD-10-CM

## 2015-08-07 DIAGNOSIS — Z87898 Personal history of other specified conditions: Secondary | ICD-10-CM

## 2015-08-07 DIAGNOSIS — I5032 Chronic diastolic (congestive) heart failure: Secondary | ICD-10-CM

## 2015-08-07 NOTE — Telephone Encounter (Signed)
Patient would like to switch cardiologist. She is requesting a referral to [facility]

## 2015-08-08 DIAGNOSIS — M25569 Pain in unspecified knee: Secondary | ICD-10-CM | POA: Diagnosis not present

## 2015-08-08 NOTE — Telephone Encounter (Signed)
OK, referral ordered per pt request 

## 2015-08-10 ENCOUNTER — Ambulatory Visit: Payer: Medicare Other

## 2015-08-10 ENCOUNTER — Inpatient Hospital Stay: Admission: RE | Admit: 2015-08-10 | Payer: Medicare Other | Source: Ambulatory Visit

## 2015-08-11 ENCOUNTER — Encounter: Payer: Medicare Other | Attending: Physical Medicine & Rehabilitation | Admitting: Physical Medicine & Rehabilitation

## 2015-08-11 ENCOUNTER — Encounter: Payer: Self-pay | Admitting: Physical Medicine & Rehabilitation

## 2015-08-11 VITALS — BP 119/55 | HR 75

## 2015-08-11 DIAGNOSIS — Z79899 Other long term (current) drug therapy: Secondary | ICD-10-CM | POA: Diagnosis not present

## 2015-08-11 DIAGNOSIS — M1711 Unilateral primary osteoarthritis, right knee: Secondary | ICD-10-CM

## 2015-08-11 DIAGNOSIS — J9610 Chronic respiratory failure, unspecified whether with hypoxia or hypercapnia: Secondary | ICD-10-CM | POA: Diagnosis not present

## 2015-08-11 DIAGNOSIS — M797 Fibromyalgia: Secondary | ICD-10-CM | POA: Insufficient documentation

## 2015-08-11 DIAGNOSIS — Z5181 Encounter for therapeutic drug level monitoring: Secondary | ICD-10-CM | POA: Insufficient documentation

## 2015-08-11 DIAGNOSIS — R2689 Other abnormalities of gait and mobility: Secondary | ICD-10-CM | POA: Diagnosis not present

## 2015-08-11 MED ORDER — OXYCODONE-ACETAMINOPHEN 10-325 MG PO TABS: 1.0000 | ORAL_TABLET | Freq: Four times a day (QID) | ORAL | Status: DC | PRN

## 2015-08-11 MED ORDER — FENTANYL 12 MCG/HR TD PT72: 12.5000 ug | MEDICATED_PATCH | TRANSDERMAL | Status: DC

## 2015-08-11 MED ORDER — LIDOCAINE 5 % EX PTCH: 1.0000 | MEDICATED_PATCH | CUTANEOUS | Status: DC

## 2015-08-11 NOTE — Progress Notes (Deleted)
HPI: 69 year old female for evaluation of diastolic congestive heart failure. Cardiac catheterization February 2014 showed nonobstructive disease (most significant 50-60 RCA) and normal LV function; note of 4+ MR. Renal Dopplers May 2014 showed no stenosis. Echocardiogram May 2015 showed normal LV function, grade 1 diastolic dysfunction, mild left atrial enlargement and mild mitral regurgitation. Nuclear study March 2016 showed ejection fraction 96% and no ischemia or infarction. Also with history of presumed orthostatic syncope. Previously followed by Dr Terrence Dupont. Patient describes dyspnea which improves with her inhaler. She complains of bilateral lower extremity edema that worsens during the day and improves overnight. She has chest pain in the substernal area which has been present intermittently for several years. It can increase with deep inspiration or certain movements. Improves with nitroglycerin.  Current Outpatient Prescriptions  Medication Sig Dispense Refill  . albuterol (PROVENTIL HFA;VENTOLIN HFA) 108 (90 BASE) MCG/ACT inhaler Inhale 2 puffs into the lungs every 6 (six) hours as needed for wheezing or shortness of breath. 1 Inhaler 1  . albuterol (PROVENTIL) (2.5 MG/3ML) 0.083% nebulizer solution Take 3 mLs (2.5 mg total) by nebulization every 6 (six) hours as needed for wheezing or shortness of breath. 75 mL 1  . ALPRAZolam (XANAX) 1 MG tablet TAKE 1 TABLET 3 TIMES A DAY. 90 tablet 5  . ARIPiprazole (ABILIFY) 15 MG tablet Take 1 tablet (15 mg total) by mouth daily. 30 tablet 6  . atorvastatin (LIPITOR) 20 MG tablet Take 1 tablet (20 mg total) by mouth daily at 6 PM. 90 tablet 1  . budesonide-formoterol (SYMBICORT) 160-4.5 MCG/ACT inhaler Inhale 2 puffs into the lungs 2 (two) times daily. 1 Inhaler 12  . cephALEXin (KEFLEX) 500 MG capsule Take 1 capsule (500 mg total) by mouth 3 (three) times daily. 21 capsule 0  . Cholecalciferol (VITAMIN D3) 50000 UNITS CAPS Take 50,000 Units by  mouth once a week. 12 capsule 0  . diclofenac sodium (VOLTAREN) 1 % GEL Apply 4 g topically 3 (three) times daily. TO BOTH KNEES 100 g 6  . DULoxetine (CYMBALTA) 30 MG capsule 1 cap po qd to be combined with a 60mg  duloxetine cap 30 capsule 6  . DULoxetine (CYMBALTA) 60 MG capsule 1 cap po qd to be combined with a 30 mg duloxetine cap daily 90 capsule 1  . fentaNYL (DURAGESIC - DOSED MCG/HR) 12 MCG/HR Place 1 patch (12.5 mcg total) onto the skin every 3 (three) days. 10 patch 0  . folic acid (FOLVITE) 1 MG tablet Take 2 tablets (2 mg total) by mouth daily. 60 tablet 6  . isosorbide mononitrate (IMDUR) 30 MG 24 hr tablet Take 1 tablet (30 mg total) by mouth daily. 90 tablet 1  . lidocaine (LIDODERM) 5 % Place 1 patch onto the skin daily. Remove & Discard patch within 12 hours or as directed by MD 60 patch 4  . MAGNESIUM CARBONATE PO Take 400 mg by mouth every morning.    . nebivolol (BYSTOLIC) 5 MG tablet Take 1 tablet (5 mg total) by mouth daily. 30 tablet 6  . nitroGLYCERIN (NITROSTAT) 0.4 MG SL tablet Place 0.4 mg under the tongue every 5 (five) minutes as needed for chest pain.    [name] Kitchen ondansetron (ZOFRAN) 4 MG tablet Take 1 tablet (4 mg total) by mouth every 6 (six) hours. 12 tablet 0  . oxyCODONE-acetaminophen (PERCOCET) 10-325 MG per tablet Take 1 tablet by mouth every 6 (six) hours as needed for pain. ONE MONTH SUPPLY 100 tablet 0  . pantoprazole (  PROTONIX) 40 MG tablet Take 1 tablet (40 mg total) by mouth daily. 90 tablet 1  . polyethylene glycol powder (GLYCOLAX/MIRALAX) powder Take 17 g by mouth daily. 3350 g 6  . pregabalin (LYRICA) 300 MG capsule Take 1 capsule twice a day 60 capsule 5  . sulfamethoxazole-trimethoprim (BACTRIM DS,SEPTRA DS) 800-160 MG per tablet Take 1 tablet by mouth daily. 30 tablet 6  . traZODone (DESYREL) 50 MG tablet Take 3 tablets (150 mg total) by mouth at bedtime. 90 tablet 6  . furosemide (LASIX) 20 MG tablet Take 1 tablet (20 mg total) by mouth daily. 90 tablet 3    No current facility-administered medications for this visit.   Facility-Administered Medications Ordered in Other Visits  Medication Dose Route Frequency Provider Last Rate Last Dose  . ferumoxytol (FERAHEME) 510 mg in sodium chloride 0.9 % 100 mL IVPB  510 mg Intravenous Once Volanda Napoleon, MD        Allergies  Allergen Reactions  . Oxycodone     Son asked [place] not to prescribe narcotics for patient due to patient dependence.  See 08/26/14 note  . Penicillins Itching, Swelling and Rash    Tolerated Cefepime in ED.     Past Medical History  Diagnosis Date  . HTN (hypertension)   . Depression   . Recurrent UTI     +hx of hospitalization for pyelonephritis  . Hay fever   . Mixed incontinence urge and stress   . Diverticular disease   . Insomnia   . Fibromyalgia     Patient states dx was around her late 80s but she had sx's for years prior to this.  . Syncope     +Hypotensive; ED visit--Dr. Terrence Dupont did Cath--nonobstructive CAD, EF 55-60%.  In retrospect, suspect pt rx med misuse/polypharmacy  . Idiopathic angio-edema-urticaria 72014    Angioedema component was very minimal  . Asthma     + asbestososis   . History of pneumonia     hospitalized 12/2011, 02/2013, and 07/2013 [facility]) for this  . Anginal pain     Nonobstructive CAD 2014; however, her cardiologist put her on a statin for this and NOT for hyperlipidemia per pt report.  . OSA on CPAP     prior to move to [place]--had another sleep study 10/2015 w/pulm Dr. Camillo Flaming.  . Iron deficiency anemia     Hematologist in [place], [place] did extensive w/u; no cause found; failed oral supplement;; gets fairly regular (q57m or so) IV iron infusions (Venofer {iron sucrose} 200mg  with procrit.  "for 14 yr I've been getting blood work q month & getting infusions prn" (07/12/2013).  Dr. Marin Olp locally, iron infusions done, EPO deficiency dx'd  . H/O hiatal hernia   . Migraine syndrome     "not as often anymore; used to be ~ q wk" (07/12/2013)  .  Tension headache, chronic   . DDD (degenerative disc disease)     lumbar and cervical.   . Osteoarthritis     "severe; progressing fast" (07/12/2013); multiple joints-not surgical candidate for TKR (03/2015)  . Chronic lower back pain   . Anxiety     panic attacks  . Nephrolithiasis     "passed all on my own or they are still in there" (07/12/2013)  . Pyelonephritis     "several times over the last 30 yr" (07/12/2013)  . Diastolic congestive heart failure   . COPD (chronic obstructive pulmonary disease)   . Pulmonary embolism 07/2013    Dx at [facility]  with very small peripheral upper lobe pe 07/2013: pt took coumadin for about 8-9 mo  . Pleural plaque with presence of asbestos 07/22/2013  . BPPV (benign paroxysmal positional vertigo) 12/16/2012  . RBBB (right bundle branch block)   . Pernicious anemia 08/24/2014  . Acute upper GI bleed 06/2014    while pt taking coumadin, plavix, and meloxicam---despite being told not to take coumadin.    Past Surgical History  Procedure Laterality Date  . Appendectomy  1960  . Total abdominal hysterectomy  1974  . Tendon release  1996    Right forearm and hand  . Knee surgery  2005  . Heel spur surgery Left 2008  . Plantar fascia release Left 2008  . Axillary surgery Left 1978    Multiple "lump" in armpit per pt  . Coccyx removal  1972  . Cardiac catheterization  01/2013    nonobstructive CAD, EF 55-60%  . Transthoracic echocardiogram  01/2013; 04/2014    2014--NORMAL.  2015--focal basal septal hypertrophy, EF 55-60%, grade I diast dysfxn, mild LAE.    . Dilation and curettage of uterus  ? 1970's  . Eye surgery Left 2012-2013    "injections for ~ 1 yr; don't really know what for" (07/12/2013)  . Spirometry  04/25/14    In hosp for acute asthma/COPD flare: mixed obstructive and restrictive lung disease. The FEV1 is severely reduced at 45% predicted.  FEV1 signif decreased compared to prior spirometry 07/23/13.  [name] Kitchen Esophagogastroduodenoscopy N/A 07/19/2014     Gastritis found + in the setting of supratherapeutic INR, +plavix, + meloxicam.  . Left heart catheterization with coronary angiogram N/A 01/30/2013    Procedure: LEFT HEART CATHETERIZATION WITH CORONARY ANGIOGRAM;  Surgeon: Clent Demark, MD;  Location: [facility] CATH LAB;  Service: Cardiovascular;  Laterality: N/A;  . Cardiovascular stress test  02/22/15    Low risk myocard perf imaging; wall motion normal, normal EF    Social History   Social History  . Marital Status: Widowed    Spouse Name: N/A  . Number of Children: 2  . Years of Education: N/A   Occupational History  . Not on file.   Social History Main Topics  . Smoking status: Never Smoker   . Smokeless tobacco: Never Used     Comment: never used tobacco  . Alcohol Use: No  . Drug Use: No  . Sexual Activity: Not Currently   Other Topics Concern  . Not on file   Social History Narrative   Widowed, 2 sons.  Relocated to [place] 09/2012 to be closer to her son who has MS.   Husband d 2015--mesothelioma.   Occupation: former Pharmacist, hospital.   Education: masters degree level.   No T/A/Ds.    Family History  Problem Relation Age of Onset  . Arthritis Mother   . Kidney disease Mother   . Heart disease Father   . Stroke Father   . Hypertension Father   . Diabetes Father     ROS: arthralgias but no fevers or chills, productive cough, hemoptysis, dysphasia, odynophagia, melena, hematochezia, dysuria, hematuria, rash, seizure activity, claudication. Remaining systems are negative.  Physical Exam:   Blood pressure 108/72, pulse 77, height 5\' 4"  (1.626 m), weight 172 lb 6.4 oz (78.2 kg).  General:  Well developed/frail in NAD Skin warm/dry Patient not depressed No peripheral clubbing Back-normal HEENT-normal/normal eyelids Neck supple/normal carotid upstroke bilaterally; no bruits; no JVD; no thyromegaly chest - CTA/ normal expansion CV - RRR/normal S1 and S2; no murmurs,  rubs or gallops;  PMI nondisplaced Abdomen -NT/ND, no HSM,  no mass, + bowel sounds, no bruit 2+ femoral pulses, no bruits Ext-trace edema, no chords, 2+ DP Neuro-grossly nonfocal  ECG NSR, RBBB

## 2015-08-11 NOTE — Progress Notes (Signed)
Subjective:    Patient ID: Linda [name], female    DOB: 1946-06-17, 69 y.o.   MRN: [id number]  HPI   Linda [name] is here in follow up of her chronic pain. She states her knees have further deteriorated where she can no longer bear weight on them without substantial pain. The other day she fell against her dresser and twisted her right knee which exacerbated things further.   Prior to the fall, she had been doing some exercise at home and even some aquatic based therapy.   She has noticed some swelling in her feet/ankles, left more than right. She sometimes puts her feet up  She is using percocet 10/325 for breakthrough pain. She is taking an occasional extra dose at night, but in general due to the progression of her arthritis, it's not working as well any more.  She is involved in group/social activities at [facility] where she lives which she enjoys.   Pain Inventory Average Pain 5 Pain Right Now 8 My pain is sharp, stabbing and aching  In the last 24 hours, has pain interfered with the following? General activity 2 Relation with others 2 Enjoyment of life 2 What TIME of day is your pain at its worst? morning, daytime, night Sleep (in general) Fair  Pain is worse with: walking Pain improves with: na Relief from Meds: na  Mobility walk with assistance use a walker ability to climb steps?  no do you drive?  yes use a wheelchair transfers alone  Function retired I need assistance with the following:  household duties and shopping  Neuro/Psych bladder control problems weakness trouble walking depression  Prior Studies Any changes since last visit?  yes  Physicians involved in your care Primary care na   Family History  Problem Relation Age of Onset  . Arthritis Mother   . Kidney disease Mother   . Heart disease Father   . Stroke Father   . Hypertension Father   . Diabetes Father    Social History   Social History  . Marital Status:  Widowed    Spouse Name: N/A  . Number of Children: N/A  . Years of Education: N/A   Social History Main Topics  . Smoking status: Never Smoker   . Smokeless tobacco: Never Used     Comment: never used tobacco  . Alcohol Use: No  . Drug Use: No  . Sexual Activity: Not Currently   Other Topics Concern  . None   Social History Narrative   Widowed, 2 sons.  Relocated to [place] 09/2012 to be closer to her son who has MS.   Husband d 2015--mesothelioma.   Occupation: former Pharmacist, hospital.   Education: masters degree level.   No T/A/Ds.   Past Surgical History  Procedure Laterality Date  . Appendectomy  1960  . Total abdominal hysterectomy  1974  . Tendon release  1996    Right forearm and hand  . Knee surgery  2005  . Heel spur surgery Left 2008  . Plantar fascia release Left 2008  . Axillary surgery Left 1978    Multiple "lump" in armpit per pt  . Coccyx removal  1972  . Cardiac catheterization  01/2013    nonobstructive CAD, EF 55-60%  . Transthoracic echocardiogram  01/2013; 04/2014    2014--NORMAL.  2015--focal basal septal hypertrophy, EF 55-60%, grade I diast dysfxn, mild LAE.    . Dilation and curettage of uterus  ? 1970's  . Eye surgery Left  2012-2013    "injections for ~ 1 yr; don't really know what for" (07/12/2013)  . Spirometry  04/25/14    In hosp for acute asthma/COPD flare: mixed obstructive and restrictive lung disease. The FEV1 is severely reduced at 45% predicted.  FEV1 signif decreased compared to prior spirometry 07/23/13.  [name] Kitchen Esophagogastroduodenoscopy N/A 07/19/2014    Gastritis found + in the setting of supratherapeutic INR, +plavix, + meloxicam.  . Left heart catheterization with coronary angiogram N/A 01/30/2013    Procedure: LEFT HEART CATHETERIZATION WITH CORONARY ANGIOGRAM;  Surgeon: Clent Demark, MD;  Location: [facility] CATH LAB;  Service: Cardiovascular;  Laterality: N/A;  . Cardiovascular stress test  02/22/15    Low risk myocard perf imaging; wall motion normal, normal  EF   Past Medical History  Diagnosis Date  . HTN (hypertension)   . Depression   . Recurrent UTI     +hx of hospitalization for pyelonephritis  . Hay fever   . Mixed incontinence urge and stress   . Diverticular disease   . Insomnia   . Fibromyalgia     Patient states dx was around her late 62s but she had sx's for years prior to this.  . Syncope     +Hypotensive; ED visit--Dr. Terrence Dupont did Cath--nonobstructive CAD, EF 55-60%.  In retrospect, suspect pt rx med misuse/polypharmacy  . Idiopathic angio-edema-urticaria 72014    Angioedema component was very minimal  . Asthma     + asbestososis   . History of pneumonia     hospitalized 12/2011, 02/2013, and 07/2013 [facility]) for this  . Anginal pain     Nonobstructive CAD 2014; however, her cardiologist put her on a statin for this and NOT for hyperlipidemia per pt report.  . OSA on CPAP     prior to move to [place]--had another sleep study 10/2015 w/pulm Dr. Camillo Flaming.  . Iron deficiency anemia     Hematologist in [place], [place] did extensive w/u; no cause found; failed oral supplement;; gets fairly regular (q40m or so) IV iron infusions (Venofer   200mg  with procrit.  "for 14 yr I've been getting blood work q month & getting infusions prn" (07/12/2013).  Dr. Marin Olp locally, iron infusions done, EPO deficiency dx'd  . H/O hiatal hernia   . Migraine syndrome     "not as often anymore; used to be ~ q wk" (07/12/2013)  . Tension headache, chronic   . DDD (degenerative disc disease)     lumbar and cervical.   . Osteoarthritis     "severe; progressing fast" (07/12/2013); multiple joints-not surgical candidate for TKR (03/2015)  . Chronic lower back pain   . Anxiety     panic attacks  . Nephrolithiasis     "passed all on my own or they are still in there" (07/12/2013)  . Pyelonephritis     "several times over the last 30 yr" (07/12/2013)  . Diastolic congestive heart failure   . COPD (chronic obstructive pulmonary disease)   . Pulmonary embolism 07/2013      Dx at [facility] with very small peripheral upper lobe pe 07/2013: pt took coumadin for about 8-9 mo  . Pleural plaque with presence of asbestos 07/22/2013  . BPPV (benign paroxysmal positional vertigo) 12/16/2012  . RBBB (right bundle branch block)   . Pernicious anemia 08/24/2014  . Acute upper GI bleed 06/2014    while pt taking coumadin, plavix, and meloxicam---despite being told not to take coumadin.   BP 119/55 mmHg  Pulse 75  SpO2 95%  Opioid Risk Score:   Fall Risk Score:  `1  Depression screen PHQ 2/9  Depression screen [facility] 2/9 08/11/2015 07/12/2015 03/27/2015 03/31/2014  Decreased Interest 3 0 3 0  Down, Depressed, Hopeless 3 1 3  0  PHQ - 2 Score 6 1 6  0  Altered sleeping 2 - 2 -  Tired, decreased energy 3 - 3 -  Change in appetite 0 - 2 -  Feeling bad or failure about yourself  2 - 2 -  Trouble concentrating 0 - 2 -  Moving slowly or fidgety/restless 0 - 0 -  Suicidal thoughts 0 - 0 -  PHQ-9 Score 13 - 17 -     Review of Systems  Constitutional:       Night sweats  Respiratory: Positive for shortness of breath.   Cardiovascular:       Limb swelling  Gastrointestinal: Positive for nausea.  Genitourinary: Positive for dyspareunia.  All other systems reviewed and are negative.      Objective:   Physical Exam  Constitutional: she is in a w/c. Portable oxygen connected at 2L. HENT: oral mucosa pink, dentition good  Head: Normocephalic and atraumatic.  Eyes: Conjunctivae are normal. Pupils are equal, round, and reactive to light.  Neck: Normal range of motion. Neck supple.  Cardiovascular: Normal rate and regular rhythm. No murmurs  Respiratory: Effort normal and breath sounds normal. No respiratory distress. She has no wheezes.  GI: Soft. Bowel sounds are normal. She exhibits no distension. There is no tenderness.  Neurological: She is alert and oriented to person, place, and time. Mild sensory loss over right foot and ulnar hand is persistent---otherwise normal sensory  exam. Strength grossly 4+/5 in bilateral UE's. LE's 4/5 HF and Knees, 4/5 ankles.. Normal rom and fmc. Cognitively displays good insight and awareness.    Skin: Skin is warm and dry.   M/S: substantial pain in right knee. Mild crepitus. Some surrounding effsuion. mcmurray's + for medial joint line pain. Left knee also very tender with pain upon palpation anteriorly and posteriorly. Edema 1+ LLE trace RLE. Left calf and tibial area tender. No warmth. Psychiatric: She appears  Depressed but mood a little better than our last visit.   Assessment/Plan:  1. Functional deficits secondary to Gait disorder due to multiple medical and psychosocial issues--- .  2. Low back pain/fibromyalgia/R>L Knee OA    - lidoderm patches for both knees along with voltaren gel. Really don't recommend oral ibuprofen given her comorbidities -percocet 10/325 one q 6 prn #100  -will add fentanyl patch for more constant pain control--82mcg q72 hours to start. 4. Lower ext edema:  -knee high TEDS  -elevation  -cards follow up as well 4. Depression with anxiety/Grief reaction/Mood: Trazodone/cymbalta .  -this continues to be a  problem-- .  5. [name] with asthma: Oxygen dependent. Albuterol prn.   Thirty minutes of face to face patient care time were spent during this visit. All questions were encouraged and answered. Follow up in about a month.

## 2015-08-11 NOTE — Patient Instructions (Signed)
USE THE LIDODERM PATCH AND VOLTAREN GEL FOR YOUR KNEES   TRY ELEVATING YOUR LEGS AND WEARING KNEE HIGH COMPRESSION STOCKINGS TO HELP WITH YOUR SWELLING

## 2015-08-14 ENCOUNTER — Telehealth: Payer: Self-pay | Admitting: Cardiology

## 2015-08-14 ENCOUNTER — Telehealth: Payer: Self-pay | Admitting: Hematology & Oncology

## 2015-08-14 ENCOUNTER — Ambulatory Visit: Payer: Medicare Other

## 2015-08-14 NOTE — Telephone Encounter (Signed)
Pt has knee problems moved appt to 8/26

## 2015-08-14 NOTE — Telephone Encounter (Signed)
Patient called office requesting we get her records from Dr Terrence Dupont for her appointment on 08/16/15 with Dr Stanford Breed.  Called Dr Zenia Resides office and faxed request (Dr to Dr). lp

## 2015-08-15 ENCOUNTER — Telehealth: Payer: Self-pay | Admitting: Cardiology

## 2015-08-15 NOTE — Telephone Encounter (Signed)
Received records from [facility] for appointment with Dr Stanford Breed on 08/16/15.  Records given to [facility] (medical records) for Dr Jacalyn Lefevre schedule on 08/16/15. lp

## 2015-08-16 ENCOUNTER — Ambulatory Visit (INDEPENDENT_AMBULATORY_CARE_PROVIDER_SITE_OTHER): Payer: Medicare Other | Admitting: Cardiology

## 2015-08-16 ENCOUNTER — Encounter: Payer: Self-pay | Admitting: Cardiology

## 2015-08-16 ENCOUNTER — Encounter: Payer: Self-pay | Admitting: *Deleted

## 2015-08-16 VITALS — BP 108/72 | HR 77 | Ht 64.0 in | Wt 172.4 lb

## 2015-08-16 DIAGNOSIS — I5032 Chronic diastolic (congestive) heart failure: Secondary | ICD-10-CM | POA: Diagnosis not present

## 2015-08-16 DIAGNOSIS — R072 Precordial pain: Secondary | ICD-10-CM

## 2015-08-16 DIAGNOSIS — R0600 Dyspnea, unspecified: Secondary | ICD-10-CM

## 2015-08-16 DIAGNOSIS — E785 Hyperlipidemia, unspecified: Secondary | ICD-10-CM

## 2015-08-16 DIAGNOSIS — I2583 Coronary atherosclerosis due to lipid rich plaque: Secondary | ICD-10-CM

## 2015-08-16 DIAGNOSIS — I2510 Atherosclerotic heart disease of native coronary artery without angina pectoris: Secondary | ICD-10-CM | POA: Insufficient documentation

## 2015-08-16 DIAGNOSIS — R079 Chest pain, unspecified: Secondary | ICD-10-CM | POA: Insufficient documentation

## 2015-08-16 MED ORDER — FUROSEMIDE 20 MG PO TABS: 20.0000 mg | ORAL_TABLET | Freq: Every day | ORAL | Status: DC

## 2015-08-16 NOTE — Assessment & Plan Note (Signed)
Continue aspirin and statin. 

## 2015-08-16 NOTE — Assessment & Plan Note (Signed)
Continue statin. 

## 2015-08-16 NOTE — Assessment & Plan Note (Signed)
Patient describes mild bilateral pedal edema. Plan to repeat echocardiogram. Add Lasix 20 mg daily. Check potassium and renal function as well as BNP in 1 week.

## 2015-08-16 NOTE — Progress Notes (Signed)
HPI: 69 year old female for evaluation of diastolic congestive heart failure. Cardiac catheterization February 2014 showed nonobstructive disease (most significant 50-60 RCA) and normal LV function; note of 4+ MR. Renal Dopplers May 2014 showed no stenosis. Echocardiogram May 2015 showed normal LV function, grade 1 diastolic dysfunction, mild left atrial enlargement and mild mitral regurgitation. Nuclear study March 2016 showed ejection fraction 96% and no ischemia or infarction. Also with history of presumed orthostatic syncope. Previously followed by Dr Terrence Dupont. Patient describes dyspnea which improves with her inhaler. She complains of bilateral lower extremity edema that worsens during the day and improves overnight. She has chest pain in the substernal area which has been present intermittently for several years. It can increase with deep inspiration or certain movements. Improves with nitroglycerin.  Current Outpatient Prescriptions  Medication Sig Dispense Refill  . albuterol (PROVENTIL HFA;VENTOLIN HFA) 108 (90 BASE) MCG/ACT inhaler Inhale 2 puffs into the lungs every 6 (six) hours as needed for wheezing or shortness of breath. 1 Inhaler 1  . albuterol (PROVENTIL) (2.5 MG/3ML) 0.083% nebulizer solution Take 3 mLs (2.5 mg total) by nebulization every 6 (six) hours as needed for wheezing or shortness of breath. 75 mL 1  . ALPRAZolam (XANAX) 1 MG tablet TAKE 1 TABLET 3 TIMES A DAY. 90 tablet 5  . ARIPiprazole (ABILIFY) 15 MG tablet Take 1 tablet (15 mg total) by mouth daily. 30 tablet 6  . atorvastatin (LIPITOR) 20 MG tablet Take 1 tablet (20 mg total) by mouth daily at 6 PM. 90 tablet 1  . budesonide-formoterol (SYMBICORT) 160-4.5 MCG/ACT inhaler Inhale 2 puffs into the lungs 2 (two) times daily. 1 Inhaler 12  . cephALEXin (KEFLEX) 500 MG capsule Take 1 capsule (500 mg total) by mouth 3 (three) times daily. 21 capsule 0  . Cholecalciferol (VITAMIN D3) 50000 UNITS CAPS Take 50,000 Units by  mouth once a week. 12 capsule 0  . diclofenac sodium (VOLTAREN) 1 % GEL Apply 4 g topically 3 (three) times daily. TO BOTH KNEES 100 g 6  . DULoxetine (CYMBALTA) 30 MG capsule 1 cap po qd to be combined with a 60mg  duloxetine cap 30 capsule 6  . DULoxetine (CYMBALTA) 60 MG capsule 1 cap po qd to be combined with a 30 mg duloxetine cap daily 90 capsule 1  . fentaNYL (DURAGESIC - DOSED MCG/HR) 12 MCG/HR Place 1 patch (12.5 mcg total) onto the skin every 3 (three) days. 10 patch 0  . folic acid (FOLVITE) 1 MG tablet Take 2 tablets (2 mg total) by mouth daily. 60 tablet 6  . isosorbide mononitrate (IMDUR) 30 MG 24 hr tablet Take 1 tablet (30 mg total) by mouth daily. 90 tablet 1  . lidocaine (LIDODERM) 5 % Place 1 patch onto the skin daily. Remove & Discard patch within 12 hours or as directed by MD 60 patch 4  . MAGNESIUM CARBONATE PO Take 400 mg by mouth every morning.    . nebivolol (BYSTOLIC) 5 MG tablet Take 1 tablet (5 mg total) by mouth daily. 30 tablet 6  . nitroGLYCERIN (NITROSTAT) 0.4 MG SL tablet Place 0.4 mg under the tongue every 5 (five) minutes as needed for chest pain.    [name] Kitchen ondansetron (ZOFRAN) 4 MG tablet Take 1 tablet (4 mg total) by mouth every 6 (six) hours. 12 tablet 0  . oxyCODONE-acetaminophen (PERCOCET) 10-325 MG per tablet Take 1 tablet by mouth every 6 (six) hours as needed for pain. ONE MONTH SUPPLY 100 tablet 0  . pantoprazole (  PROTONIX) 40 MG tablet Take 1 tablet (40 mg total) by mouth daily. 90 tablet 1  . polyethylene glycol powder (GLYCOLAX/MIRALAX) powder Take 17 g by mouth daily. 3350 g 6  . pregabalin (LYRICA) 300 MG capsule Take 1 capsule twice a day 60 capsule 5  . sulfamethoxazole-trimethoprim (BACTRIM DS,SEPTRA DS) 800-160 MG per tablet Take 1 tablet by mouth daily. 30 tablet 6  . traZODone (DESYREL) 50 MG tablet Take 3 tablets (150 mg total) by mouth at bedtime. 90 tablet 6  . furosemide (LASIX) 20 MG tablet Take 1 tablet (20 mg total) by mouth daily. 90 tablet 3    No current facility-administered medications for this visit.   Facility-Administered Medications Ordered in Other Visits  Medication Dose Route Frequency Provider Last Rate Last Dose  . ferumoxytol (FERAHEME) 510 mg in sodium chloride 0.9 % 100 mL IVPB  510 mg Intravenous Once Volanda Napoleon, MD        Allergies  Allergen Reactions  . Oxycodone     Son asked [place] not to prescribe narcotics for patient due to patient dependence.  See 08/26/14 note  . Penicillins Itching, Swelling and Rash    Tolerated Cefepime in ED.     Past Medical History  Diagnosis Date  . HTN (hypertension)   . Depression   . Recurrent UTI     +hx of hospitalization for pyelonephritis  . Hay fever   . Mixed incontinence urge and stress   . Diverticular disease   . Insomnia   . Fibromyalgia     Patient states dx was around her late 34s but she had sx's for years prior to this.  . Syncope     +Hypotensive; ED visit--Dr. Terrence Dupont did Cath--nonobstructive CAD, EF 55-60%.  In retrospect, suspect pt rx med misuse/polypharmacy  . Idiopathic angio-edema-urticaria 72014    Angioedema component was very minimal  . Asthma     + asbestososis   . History of pneumonia     hospitalized 12/2011, 02/2013, and 07/2013 [facility]) for this  . Anginal pain     Nonobstructive CAD 2014; however, her cardiologist put her on a statin for this and NOT for hyperlipidemia per pt report.  . OSA on CPAP     prior to move to --had another sleep study 10/2015 w/pulm Dr. Camillo Flaming.  . Iron deficiency anemia     Hematologist in [place], [place] did extensive w/u; no cause found; failed oral supplement;; gets fairly regular (q75m or so) IV iron infusions (Venofer 200mg  with procrit.  "for 14 yr I've been getting blood work q month & getting infusions prn" (07/12/2013).  Dr. Marin Olp locally, iron infusions done, EPO deficiency dx'd  . H/O hiatal hernia   . Migraine syndrome     "not as often anymore; used to be ~ q wk" (07/12/2013)  . Tension  headache, chronic   . DDD (degenerative disc disease)     lumbar and cervical.   . Osteoarthritis     "severe; progressing fast" (07/12/2013); multiple joints-not surgical candidate for TKR (03/2015)  . Chronic lower back pain   . Anxiety     panic attacks  . Nephrolithiasis     "passed all on my own or they are still in there" (07/12/2013)  . Pyelonephritis     "several times over the last 30 yr" (07/12/2013)  . Diastolic congestive heart failure   . COPD (chronic obstructive pulmonary disease)   . Pulmonary embolism 07/2013    Dx at [facility] with very  small peripheral upper lobe pe 07/2013: pt took coumadin for about 8-9 mo  . Pleural plaque with presence of asbestos 07/22/2013  . BPPV (benign paroxysmal positional vertigo) 12/16/2012  . RBBB (right bundle branch block)   . Pernicious anemia 08/24/2014  . Acute upper GI bleed 06/2014    while pt taking coumadin, plavix, and meloxicam---despite being told not to take coumadin.    Past Surgical History  Procedure Laterality Date  . Appendectomy  1960  . Total abdominal hysterectomy  1974  . Tendon release  1996    Right forearm and hand  . Knee surgery  2005  . Heel spur surgery Left 2008  . Plantar fascia release Left 2008  . Axillary surgery Left 1978    Multiple "lump" in armpit per pt  . Coccyx removal  1972  . Cardiac catheterization  01/2013    nonobstructive CAD, EF 55-60%  . Transthoracic echocardiogram  01/2013; 04/2014    2014--NORMAL.  2015--focal basal septal hypertrophy, EF 55-60%, grade I diast dysfxn, mild LAE.    . Dilation and curettage of uterus  ? 1970's  . Eye surgery Left 2012-2013    "injections for ~ 1 yr; don't really know what for" (07/12/2013)  . Spirometry  04/25/14    In hosp for acute asthma/COPD flare: mixed obstructive and restrictive lung disease. The FEV1 is severely reduced at 45% predicted.  FEV1 signif decreased compared to prior spirometry 07/23/13.  [name] Kitchen Esophagogastroduodenoscopy N/A 07/19/2014    Gastritis  found + in the setting of supratherapeutic INR, +plavix, + meloxicam.  . Left heart catheterization with coronary angiogram N/A 01/30/2013    Procedure: LEFT HEART CATHETERIZATION WITH CORONARY ANGIOGRAM;  Surgeon: Clent Demark, MD;  Location: [facility] CATH LAB;  Service: Cardiovascular;  Laterality: N/A;  . Cardiovascular stress test  02/22/15    Low risk myocard perf imaging; wall motion normal, normal EF    Social History   Social History  . Marital Status: Widowed    Spouse Name: N/A  . Number of Children: 2  . Years of Education: N/A   Occupational History  . Not on file.   Social History Main Topics  . Smoking status: Never Smoker   . Smokeless tobacco: Never Used     Comment: never used tobacco  . Alcohol Use: No  . Drug Use: No  . Sexual Activity: Not Currently   Other Topics Concern  . Not on file   Social History Narrative   Widowed, 2 sons.  Relocated to [place] 09/2012 to be closer to her son who has MS.   Husband d 2015--mesothelioma.   Occupation: former Pharmacist, hospital.   Education: masters degree level.   No T/A/Ds.    Family History  Problem Relation Age of Onset  . Arthritis Mother   . Kidney disease Mother   . Heart disease Father   . Stroke Father   . Hypertension Father   . Diabetes Father     ROS: arthralgias but no fevers or chills, productive cough, hemoptysis, dysphasia, odynophagia, melena, hematochezia, dysuria, hematuria, rash, seizure activity, claudication. Remaining systems are negative.  Physical Exam:   Blood pressure 108/72, pulse 77, height 5\' 4"  (1.626 m), weight 172 lb 6.4 oz (78.2 kg).  General:  Well developed/frail in NAD Skin warm/dry Patient not depressed No peripheral clubbing Back-normal HEENT-normal/normal eyelids Neck supple/normal carotid upstroke bilaterally; no bruits; no JVD; no thyromegaly chest - CTA/ normal expansion CV - RRR/normal S1 and S2; no murmurs, rubs or  gallops;  PMI nondisplaced Abdomen -NT/ND, no HSM, no mass,  + bowel sounds, no bruit 2+ femoral pulses, no bruits Ext-trace edema, no chords, 2+ DP Neuro-grossly nonfocal  ECG NSR, RBBB

## 2015-08-16 NOTE — Patient Instructions (Signed)
Your physician wants you to follow-up in: [place] will receive a reminder letter in the mail two months in advance. If you don't receive a letter, please call our office to schedule the follow-up appointment.   Your physician has requested that you have an echocardiogram. Echocardiography is a painless test that uses sound waves to create images of your heart. It provides your doctor with information about the size and shape of your heart and how well your heart's chambers and valves are working. This procedure takes approximately one hour. There are no restrictions for this procedure.   START FUROSEMIDE 20 MG ONCE DAILY  Your physician recommends that you return for lab work in: [place]

## 2015-08-16 NOTE — Assessment & Plan Note (Signed)
Blood pressure controlled. Continue present medications. 

## 2015-08-16 NOTE — Assessment & Plan Note (Signed)
Symptoms atypical and chronic; recent nuclear study negative; no further ischemia eval.

## 2015-08-18 ENCOUNTER — Ambulatory Visit (HOSPITAL_BASED_OUTPATIENT_CLINIC_OR_DEPARTMENT_OTHER): Payer: Medicare Other

## 2015-08-18 VITALS — BP 103/61 | HR 73 | Temp 97.5°F | Resp 18

## 2015-08-18 DIAGNOSIS — D509 Iron deficiency anemia, unspecified: Secondary | ICD-10-CM

## 2015-08-18 DIAGNOSIS — I2699 Other pulmonary embolism without acute cor pulmonale: Secondary | ICD-10-CM

## 2015-08-18 DIAGNOSIS — D510 Vitamin B12 deficiency anemia due to intrinsic factor deficiency: Secondary | ICD-10-CM

## 2015-08-18 MED ORDER — CYANOCOBALAMIN 1000 MCG/ML IJ SOLN
1000.0000 ug | Freq: Once | INTRAMUSCULAR | Status: AC
  Administered 2015-08-18: 1000 ug via INTRAMUSCULAR

## 2015-08-18 NOTE — Patient Instructions (Signed)

## 2015-08-22 DIAGNOSIS — M1711 Unilateral primary osteoarthritis, right knee: Secondary | ICD-10-CM | POA: Diagnosis not present

## 2015-08-22 DIAGNOSIS — M170 Bilateral primary osteoarthritis of knee: Secondary | ICD-10-CM | POA: Diagnosis not present

## 2015-08-22 DIAGNOSIS — M1712 Unilateral primary osteoarthritis, left knee: Secondary | ICD-10-CM | POA: Diagnosis not present

## 2015-08-23 ENCOUNTER — Telehealth: Payer: Self-pay | Admitting: Cardiology

## 2015-08-23 NOTE — Telephone Encounter (Signed)
Linda [name] states that the patient was seen by Dr. Stanford Breed last week and was complaining of swelling---Dr. Stanford Breed prescribed Lasix 20 md daily.  The patient still has swelling and has not gone to the bathroom any more than normal.

## 2015-08-23 NOTE — Telephone Encounter (Signed)
Spoke with pt dtr, she reports the pt does not seem to be responding to the lasix, she still has swelling. Left message for pt to call to discuss

## 2015-08-24 ENCOUNTER — Other Ambulatory Visit (INDEPENDENT_AMBULATORY_CARE_PROVIDER_SITE_OTHER): Payer: Medicare Other | Admitting: *Deleted

## 2015-08-24 ENCOUNTER — Ambulatory Visit (HOSPITAL_COMMUNITY): Payer: Medicare Other | Attending: Cardiology

## 2015-08-24 ENCOUNTER — Other Ambulatory Visit: Payer: Self-pay

## 2015-08-24 DIAGNOSIS — R079 Chest pain, unspecified: Secondary | ICD-10-CM

## 2015-08-24 DIAGNOSIS — Z8249 Family history of ischemic heart disease and other diseases of the circulatory system: Secondary | ICD-10-CM | POA: Insufficient documentation

## 2015-08-24 DIAGNOSIS — R0600 Dyspnea, unspecified: Secondary | ICD-10-CM

## 2015-08-24 DIAGNOSIS — I071 Rheumatic tricuspid insufficiency: Secondary | ICD-10-CM | POA: Diagnosis not present

## 2015-08-24 DIAGNOSIS — I10 Essential (primary) hypertension: Secondary | ICD-10-CM | POA: Insufficient documentation

## 2015-08-24 LAB — BASIC METABOLIC PANEL
BUN: 25 mg/dL — ABNORMAL HIGH (ref 6–23)
CALCIUM: 9.6 mg/dL (ref 8.4–10.5)
CO2: 34 mEq/L — ABNORMAL HIGH (ref 19–32)
Chloride: 101 mEq/L (ref 96–112)
Creatinine, Ser: 0.96 mg/dL (ref 0.40–1.20)
GFR: 61.17 mL/min (ref >60.00)
Glucose, Bld: 101 mg/dL — ABNORMAL HIGH (ref 70–99)
POTASSIUM: 3.5 meq/L (ref 3.5–5.1)
SODIUM: 143 meq/L (ref 135–145)

## 2015-08-24 LAB — BRAIN NATRIURETIC PEPTIDE: PRO B NATRI PEPTIDE: 92 pg/mL (ref 0.0–100.0)

## 2015-08-25 NOTE — Telephone Encounter (Signed)
Spoke with pt, aware of labs and echo results. She reports she has more days without swelling than with swelling. She will decrease the furosemide and call if swelling chenges.

## 2015-09-05 ENCOUNTER — Other Ambulatory Visit: Payer: Medicare Other

## 2015-09-05 ENCOUNTER — Telehealth: Payer: Self-pay | Admitting: Hematology & Oncology

## 2015-09-05 ENCOUNTER — Ambulatory Visit: Payer: Medicare Other

## 2015-09-05 ENCOUNTER — Ambulatory Visit: Payer: Medicare Other | Admitting: Hematology & Oncology
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
## 2015-09-07 ENCOUNTER — Encounter: Payer: Self-pay | Admitting: Family Medicine

## 2015-09-07 ENCOUNTER — Ambulatory Visit (INDEPENDENT_AMBULATORY_CARE_PROVIDER_SITE_OTHER): Payer: Medicare Other | Admitting: Family Medicine

## 2015-09-07 VITALS — BP 126/77 | HR 77 | Temp 97.5°F | Resp 18 | Ht 64.0 in | Wt 173.0 lb

## 2015-09-07 DIAGNOSIS — F332 Major depressive disorder, recurrent severe without psychotic features: Secondary | ICD-10-CM

## 2015-09-07 DIAGNOSIS — N390 Urinary tract infection, site not specified: Secondary | ICD-10-CM

## 2015-09-07 DIAGNOSIS — I2510 Atherosclerotic heart disease of native coronary artery without angina pectoris: Secondary | ICD-10-CM

## 2015-09-07 MED ORDER — ARIPIPRAZOLE 5 MG PO TABS: 5.0000 mg | ORAL_TABLET | Freq: Every day | ORAL | Status: DC

## 2015-09-07 NOTE — Progress Notes (Signed)
Pre visit review using our clinic review tool, if applicable. No additional management support is needed unless otherwise documented below in the visit note. 

## 2015-09-07 NOTE — Progress Notes (Signed)
OFFICE VISIT  09/07/2015   CC:  Chief Complaint  Patient presents with  . Follow-up   HPI:    Patient is a 69 y.o. Caucasian female who presents for 2 mo f/u depression and prolonged/abnormal grief reaction + hx of recurrent UTIs. Says she feels much improved on daily bactrim: no dysuria or foul smelling urine lately. No adverse effects of abx felt at this time.  She cut her abilify back to 1/2 of 15mg  tab b/c she felt some dry eyes/blurry vision, cognitive and motor slowing on 15mg  abilify dosing.  She took 1/2 tab for 2 weeks and felt no signif improvement so she stopped the med altogether about 10 d/a.  Since stopping it she notes return of normal vision/normal feeling of her eyes AND says she no longer feels slowing of cognition or body movements as before. She had originally had good improvement in mood and no adverse side effects when she was on 7.5mg  dosing after we titrated her up slowly from 2 mg dosing.  We discussed need to try a return to lower dose abilify today.   Of note, she saw new cardiologist, Dr. Stanford Breed, about 3 wks ago: he added 20mg  lasix qd, f/u BMP was fine.  He recommended no further ischemic w/u for this pt at this time.   PMH reviewed/updated.  Past Surgical History  Procedure Laterality Date  . Appendectomy  1960  . Total abdominal hysterectomy  1974  . Tendon release  1996    Right forearm and hand  . Knee surgery  2005  . Heel spur surgery Left 2008  . Plantar fascia release Left 2008  . Axillary surgery Left 1978    Multiple "lump" in armpit per pt  . Coccyx removal  1972  . Cardiac catheterization  01/2013    nonobstructive CAD, EF 55-60%  . Transthoracic echocardiogram  01/2013; 04/2014    2014--NORMAL.  2015--focal basal septal hypertrophy, EF 55-60%, grade I diast dysfxn, mild LAE.    . Dilation and curettage of uterus  ? 1970's  . Eye surgery Left 2012-2013    "injections for ~ 1 yr; don't really know what for" (07/12/2013)  . Spirometry   04/25/14    In hosp for acute asthma/COPD flare: mixed obstructive and restrictive lung disease. The FEV1 is severely reduced at 45% predicted.  FEV1 signif decreased compared to prior spirometry 07/23/13.  [name] Kitchen Esophagogastroduodenoscopy N/A 07/19/2014    Gastritis found + in the setting of supratherapeutic INR, +plavix, + meloxicam.  . Left heart catheterization with coronary angiogram N/A 01/30/2013    Procedure: LEFT HEART CATHETERIZATION WITH CORONARY ANGIOGRAM;  Surgeon: Clent Demark, MD;  Location: [facility] CATH LAB;  Service: Cardiovascular;  Laterality: N/A;  . Cardiovascular stress test  02/22/15    Low risk myocard perf imaging; wall motion normal, normal EF    Outpatient Prescriptions Prior to Visit  Medication Sig Dispense Refill  . albuterol (PROVENTIL HFA;VENTOLIN HFA) 108 (90 BASE) MCG/ACT inhaler Inhale 2 puffs into the lungs every 6 (six) hours as needed for wheezing or shortness of breath. 1 Inhaler 1  . albuterol (PROVENTIL) (2.5 MG/3ML) 0.083% nebulizer solution Take 3 mLs (2.5 mg total) by nebulization every 6 (six) hours as needed for wheezing or shortness of breath. 75 mL 1  . ALPRAZolam (XANAX) 1 MG tablet TAKE 1 TABLET 3 TIMES A DAY. 90 tablet 5  . atorvastatin (LIPITOR) 20 MG tablet Take 1 tablet (20 mg total) by mouth daily at 6 PM. 90  tablet 1  . budesonide-formoterol (SYMBICORT) 160-4.5 MCG/ACT inhaler Inhale 2 puffs into the lungs 2 (two) times daily. 1 Inhaler 12  . Cholecalciferol (VITAMIN D3) 50000 UNITS CAPS Take 50,000 Units by mouth once a week. 12 capsule 0  . diclofenac sodium (VOLTAREN) 1 % GEL Apply 4 g topically 3 (three) times daily. TO BOTH KNEES 100 g 6  . DULoxetine (CYMBALTA) 30 MG capsule 1 cap po qd to be combined with a 60mg  duloxetine cap 30 capsule 6  . DULoxetine (CYMBALTA) 60 MG capsule 1 cap po qd to be combined with a 30 mg duloxetine cap daily 90 capsule 1  . fentaNYL (DURAGESIC - DOSED MCG/HR) 12 MCG/HR Place 1 patch (12.5 mcg total) onto the skin every  3 (three) days. 10 patch 0  . folic acid (FOLVITE) 1 MG tablet Take 2 tablets (2 mg total) by mouth daily. 60 tablet 6  . furosemide (LASIX) 20 MG tablet Take 1 tablet (20 mg total) by mouth daily. 90 tablet 3  . isosorbide mononitrate (IMDUR) 30 MG 24 hr tablet Take 1 tablet (30 mg total) by mouth daily. 90 tablet 1  . lidocaine (LIDODERM) 5 % Place 1 patch onto the skin daily. Remove & Discard patch within 12 hours or as directed by MD 60 patch 4  . MAGNESIUM CARBONATE PO Take 400 mg by mouth every morning.    . nebivolol (BYSTOLIC) 5 MG tablet Take 1 tablet (5 mg total) by mouth daily. 30 tablet 6  . nitroGLYCERIN (NITROSTAT) 0.4 MG SL tablet Place 0.4 mg under the tongue every 5 (five) minutes as needed for chest pain.    [name] Kitchen ondansetron (ZOFRAN) 4 MG tablet Take 1 tablet (4 mg total) by mouth every 6 (six) hours. 12 tablet 0  . oxyCODONE-acetaminophen (PERCOCET) 10-325 MG per tablet Take 1 tablet by mouth every 6 (six) hours as needed for pain. ONE MONTH SUPPLY 100 tablet 0  . pantoprazole (PROTONIX) 40 MG tablet Take 1 tablet (40 mg total) by mouth daily. 90 tablet 1  . polyethylene glycol powder (GLYCOLAX/MIRALAX) powder Take 17 g by mouth daily. 3350 g 6  . pregabalin (LYRICA) 300 MG capsule Take 1 capsule twice a day 60 capsule 5  . sulfamethoxazole-trimethoprim (BACTRIM DS,SEPTRA DS) 800-160 MG per tablet Take 1 tablet by mouth daily. 30 tablet 6  . traZODone (DESYREL) 50 MG tablet Take 3 tablets (150 mg total) by mouth at bedtime. 90 tablet 6  . ARIPiprazole (ABILIFY) 15 MG tablet Take 1 tablet (15 mg total) by mouth daily. 30 tablet 6  . cephALEXin (KEFLEX) 500 MG capsule Take 1 capsule (500 mg total) by mouth 3 (three) times daily. 21 capsule 0   Facility-Administered Medications Prior to Visit  Medication Dose Route Frequency Provider Last Rate Last Dose  . ferumoxytol (FERAHEME) 510 mg in sodium chloride 0.9 % 100 mL IVPB  510 mg Intravenous Once Volanda Napoleon, MD         Allergies  Allergen Reactions  . Oxycodone     Son asked [place] not to prescribe narcotics for patient due to patient dependence.  See 08/26/14 note  . Penicillins Itching, Swelling and Rash    Tolerated Cefepime in ED.    ROS As per HPI  PE: Blood pressure 126/77, pulse 77, temperature 97.5 F (36.4 C), temperature source Oral, resp. rate 18, height 5\' 4"  (1.626 m), weight 173 lb (78.472 kg), SpO2 95 %. Gen: Alert, well appearing.  Patient is oriented to person,  place, time, and situation. AFFECT: blunted.  Talks slowly as per her usual. No lip smacking or involuntary mouth/ tongue movements.  No dystonic movements.    LABS:    Chemistry      Component Value Date/Time   NA 143 08/24/2015 1105   NA 141 06/05/2015 1315   K 3.5 08/24/2015 1105   K 3.9 06/05/2015 1315   CL 101 08/24/2015 1105   CL 103 06/05/2015 1315   CO2 34* 08/24/2015 1105   CO2 28 06/05/2015 1315   BUN 25* 08/24/2015 1105   BUN 14 06/05/2015 1315   CREATININE 0.96 08/24/2015 1105   CREATININE 0.8 06/05/2015 1315      Component Value Date/Time   CALCIUM 9.6 08/24/2015 1105   CALCIUM 9.0 06/05/2015 1315   ALKPHOS 84 06/05/2015 1315   ALKPHOS 90 10/24/2014 1148   AST 23 06/05/2015 1315   AST 15 10/24/2014 1148   ALT 17 06/05/2015 1315   ALT 11 10/24/2014 1148   BILITOT 1.00 06/05/2015 1315   BILITOT 0.3 10/24/2014 1148     Lab Results  Component Value Date   WBC 10.8* 06/05/2015   HGB 13.6 06/05/2015   HCT 40.8 06/05/2015   MCV 92 06/05/2015   PLT 242 06/05/2015   Lab Results  Component Value Date   CHOL 152 01/31/2013   HDL 44 01/31/2013   LDLCALC 90 01/31/2013   TRIG 92 01/31/2013   CHOLHDL 3.5 01/31/2013   Lab Results  Component Value Date   TSH 3.736 07/20/2013    IMPRESSION AND PLAN:  1) Major depression, recurrent, w/out psychotic features, with complicating factor of prolonged grief response and many medical/psychosocial issues.  I want to see if retry of lower dose  abilify, 5mg  qd (cut in 1/2 for one week if starting at 5mg  not tolerable) brings back the initial improvement we saw when we initially got her on this med. Continue cymbalta at 90 mg qhs and alpraz 1mg  tid.  2) Recurrent UTI: improved since getting on bactrim DS 1 tab qd prophylaxis.  3) Asthma with asbestosis, chronic hypoxemia related to this: pt needed letter written today to instruct personal on her upcoming cruise that she needs oxygen 3L via [place] 24/7--letter written and handed to pt today.  An After Visit Summary was printed and given to the patient.  Spent 25 min with pt today, with >50% of this time spent in counseling and care coordination regarding the above problems.   FOLLOW UP: Return in about 6 weeks (around 10/19/2015) for f/u depression.

## 2015-09-08 ENCOUNTER — Encounter: Payer: Medicare Other | Attending: Physical Medicine & Rehabilitation | Admitting: Registered Nurse

## 2015-09-08 ENCOUNTER — Encounter: Payer: Self-pay | Admitting: Registered Nurse

## 2015-09-08 VITALS — BP 131/63 | HR 73 | Resp 14

## 2015-09-08 DIAGNOSIS — R2689 Other abnormalities of gait and mobility: Secondary | ICD-10-CM | POA: Insufficient documentation

## 2015-09-08 DIAGNOSIS — M797 Fibromyalgia: Secondary | ICD-10-CM | POA: Diagnosis not present

## 2015-09-08 DIAGNOSIS — Z79899 Other long term (current) drug therapy: Secondary | ICD-10-CM | POA: Insufficient documentation

## 2015-09-08 DIAGNOSIS — J9610 Chronic respiratory failure, unspecified whether with hypoxia or hypercapnia: Secondary | ICD-10-CM

## 2015-09-08 DIAGNOSIS — Z5181 Encounter for therapeutic drug level monitoring: Secondary | ICD-10-CM

## 2015-09-08 DIAGNOSIS — G894 Chronic pain syndrome: Secondary | ICD-10-CM | POA: Diagnosis not present

## 2015-09-08 DIAGNOSIS — M5440 Lumbago with sciatica, unspecified side: Secondary | ICD-10-CM | POA: Diagnosis not present

## 2015-09-08 DIAGNOSIS — I2510 Atherosclerotic heart disease of native coronary artery without angina pectoris: Secondary | ICD-10-CM | POA: Diagnosis not present

## 2015-09-08 DIAGNOSIS — M1711 Unilateral primary osteoarthritis, right knee: Secondary | ICD-10-CM

## 2015-09-08 MED ORDER — OXYCODONE-ACETAMINOPHEN 10-325 MG PO TABS: 1.0000 | ORAL_TABLET | Freq: Four times a day (QID) | ORAL | Status: DC | PRN

## 2015-09-08 MED ORDER — FENTANYL 12 MCG/HR TD PT72: 12.5000 ug | MEDICATED_PATCH | TRANSDERMAL | Status: DC

## 2015-09-08 NOTE — Progress Notes (Signed)
Subjective:    Patient ID: Linda [name], female    DOB: 17-Aug-1946, 69 y.o.   MRN: [id number]  HPI: [name] is a 69 year old female who returns for follow up for chronic pain and medication refill. She says her pain is located in her lower back radiating into her lower extremities anteriorly and laterally and bilateral knees.. She rates her pain 6. She was prescribed Fentanyl last month she didn't began until 09/07/15. Her current exercise regime is walking for short distances with her cadillac walker. Also states she will be goin on a Cruise to the [place] with [employer] in two weeks.  Pain Inventory Average Pain 7 Pain Right Now 6 My pain is sharp, burning and aching  In the last 24 hours, has pain interfered with the following? General activity 8 Relation with others 8 Enjoyment of life 9 What TIME of day is your pain at its worst? morning, daytime, evening  Sleep (in general) Fair  Pain is worse with: walking, bending, standing and some activites Pain improves with: rest and medication Relief from Meds: 6  Mobility walk with assistance use a walker how many minutes can you walk? 5 ability to climb steps?  no do you drive?  yes use a wheelchair Do you have any goals in this area?  yes  Function retired  Neuro/Psych bladder control problems weakness trouble walking spasms depression anxiety  Prior Studies Any changes since last visit?  no  Physicians involved in your care Any changes since last visit?  no   Family History  Problem Relation Age of Onset  . Arthritis Mother   . Kidney disease Mother   . Heart disease Father   . Stroke Father   . Hypertension Father   . Diabetes Father    Social History   Social History  . Marital Status: Widowed    Spouse Name: N/A  . Number of Children: 2  . Years of Education: N/A   Social History Main Topics  . Smoking status: Never Smoker   . Smokeless tobacco: Never Used   Comment: never used tobacco  . Alcohol Use: No  . Drug Use: No  . Sexual Activity: Not Currently   Other Topics Concern  . None   Social History Narrative   Widowed, 2 sons.  Relocated to [place] 09/2012 to be closer to her son who has MS.   Husband d 2015--mesothelioma.   Occupation: former Pharmacist, hospital.   Education: masters degree level.   No T/A/Ds.   Past Surgical History  Procedure Laterality Date  . Appendectomy  1960  . Total abdominal hysterectomy  1974  . Tendon release  1996    Right forearm and hand  . Knee surgery  2005  . Heel spur surgery Left 2008  . Plantar fascia release Left 2008  . Axillary surgery Left 1978    Multiple "lump" in armpit per pt  . Coccyx removal  1972  . Cardiac catheterization  01/2013    nonobstructive CAD, EF 55-60%  . Transthoracic echocardiogram  01/2013; 04/2014    2014--NORMAL.  2015--focal basal septal hypertrophy, EF 55-60%, grade I diast dysfxn, mild LAE.    . Dilation and curettage of uterus  ? 1970's  . Eye surgery Left 2012-2013    "injections for ~ 1 yr; don't really know what for" (07/12/2013)  . Spirometry  04/25/14    In hosp for acute asthma/COPD flare: mixed obstructive and restrictive lung disease. The FEV1  is severely reduced at 45% predicted.  FEV1 signif decreased compared to prior spirometry 07/23/13.  [name] Kitchen Esophagogastroduodenoscopy N/A 07/19/2014    Gastritis found + in the setting of supratherapeutic INR, +plavix, + meloxicam.  . Left heart catheterization with coronary angiogram N/A 01/30/2013    Procedure: LEFT HEART CATHETERIZATION WITH CORONARY ANGIOGRAM;  Surgeon: Clent Demark, MD;  Location: [facility] CATH LAB;  Service: Cardiovascular;  Laterality: N/A;  . Cardiovascular stress test  02/22/15    Low risk myocard perf imaging; wall motion normal, normal EF    BP 131/63 mmHg  Pulse 73  Resp 14  SpO2 96%  Opioid Risk Score:   Fall Risk Score:  `1  Depression screen PHQ 2/9  Depression screen [facility] 2/9 08/11/2015 07/12/2015 03/27/2015  03/31/2014  Decreased Interest 3 0 3 0  Down, Depressed, Hopeless 3 1 3  0  PHQ - 2 Score 6 1 6  0  Altered sleeping 2 - 2 -  Tired, decreased energy 3 - 3 -  Change in appetite 0 - 2 -  Feeling bad or failure about yourself  2 - 2 -  Trouble concentrating 0 - 2 -  Moving slowly or fidgety/restless 0 - 0 -  Suicidal thoughts 0 - 0 -  PHQ-9 Score 13 - 17 -     Review of Systems  Constitutional: Positive for diaphoresis.  Respiratory: Positive for shortness of breath.   Cardiovascular: Positive for leg swelling.  Gastrointestinal: Positive for nausea.  Musculoskeletal: Positive for gait problem.  Neurological: Positive for weakness and numbness.       Spasms   Psychiatric/Behavioral: Positive for dysphoric mood. The patient is nervous/anxious.   All other systems reviewed and are negative.      Objective:   Physical Exam  Constitutional: She is oriented to person, place, and time. She appears well-developed and well-nourished.  HENT:  Head: Normocephalic and atraumatic.  Neck: Normal range of motion. Neck supple.  Cardiovascular: Normal rate and regular rhythm.   Pulmonary/Chest: Effort normal and breath sounds normal.  Musculoskeletal:  Normal Muscle Bulk and Muscle Testing Reveals: Upper Extremities: Full ROM and Muscle Strength 5/5 Thoracic Paraspinal Tenderness: T-1- T-7 Lumbar Paraspinal Tenderness: L-3- L-4 Lower Extremities: Full ROM and Muscle strength 5/5 Bilateral Lower Extremities Flexion Produces Pain into Patella Arises from chair slowly/ using cadilac walker for support Narrow Based Gait    Neurological: She is alert and oriented to person, place, and time.  Skin: Skin is warm and dry.  Psychiatric: She has a normal mood and affect.  Nursing note and vitals reviewed.         Assessment & Plan:  1. Functional deficits secondary to Gait disorder: Continue with HEP. 2. Chronic Back pain/fibromyalgia/R>L Knee OA Pain Management:  Refilled: Oxycodone  10/325 one tablet every 6 hours #100 and Fentanyl 12 mcg one patch every three days #10.  Continue Lyrica,  Lidoderm patches and Voltaren Gel  3. Depression with anxiety/Grief reaction/Mood: Continue Trazodone and Cymbalta .  4. [name] with asthma: Oxygen dependent. Albuterol prn.  On Continuous Oxygen Therapy 3 liters/ Pulmonology Following. 5. Bilateral Osteoarthritis of Bilateral Knees/ Dr. Elmyra Ricks Following. 6. Fibromyalgia: Continue with exercise regime.  20 minutes of face to face patient care time was spent during this visit. All questions were encouraged and answered.

## 2015-09-13 ENCOUNTER — Emergency Department (HOSPITAL_COMMUNITY): Payer: Medicare Other

## 2015-09-13 ENCOUNTER — Encounter (HOSPITAL_COMMUNITY): Payer: Self-pay | Admitting: *Deleted

## 2015-09-13 ENCOUNTER — Emergency Department (HOSPITAL_COMMUNITY)
Admission: EM | Admit: 2015-09-13 | Discharge: 2015-09-13 | Disposition: A | Discharge status: Home / Self Care | Payer: Medicare Other | Attending: Emergency Medicine | Admitting: Emergency Medicine

## 2015-09-13 DIAGNOSIS — Y92191 Dining room in other specified residential institution as the place of occurrence of the external cause: Secondary | ICD-10-CM | POA: Diagnosis not present

## 2015-09-13 DIAGNOSIS — I10 Essential (primary) hypertension: Secondary | ICD-10-CM | POA: Insufficient documentation

## 2015-09-13 DIAGNOSIS — Z87440 Personal history of urinary (tract) infections: Secondary | ICD-10-CM | POA: Insufficient documentation

## 2015-09-13 DIAGNOSIS — Z8701 Personal history of pneumonia (recurrent): Secondary | ICD-10-CM | POA: Insufficient documentation

## 2015-09-13 DIAGNOSIS — M542 Cervicalgia: Secondary | ICD-10-CM | POA: Diagnosis not present

## 2015-09-13 DIAGNOSIS — S199XXA Unspecified injury of neck, initial encounter: Secondary | ICD-10-CM | POA: Insufficient documentation

## 2015-09-13 DIAGNOSIS — Z9889 Other specified postprocedural states: Secondary | ICD-10-CM | POA: Diagnosis not present

## 2015-09-13 DIAGNOSIS — I5030 Unspecified diastolic (congestive) heart failure: Secondary | ICD-10-CM | POA: Insufficient documentation

## 2015-09-13 DIAGNOSIS — Z792 Long term (current) use of antibiotics: Secondary | ICD-10-CM | POA: Diagnosis not present

## 2015-09-13 DIAGNOSIS — S0990XA Unspecified injury of head, initial encounter: Secondary | ICD-10-CM | POA: Diagnosis not present

## 2015-09-13 DIAGNOSIS — Z86711 Personal history of pulmonary embolism: Secondary | ICD-10-CM | POA: Insufficient documentation

## 2015-09-13 DIAGNOSIS — Z79899 Other long term (current) drug therapy: Secondary | ICD-10-CM | POA: Diagnosis not present

## 2015-09-13 DIAGNOSIS — Y998 Other external cause status: Secondary | ICD-10-CM | POA: Diagnosis not present

## 2015-09-13 DIAGNOSIS — G8929 Other chronic pain: Secondary | ICD-10-CM | POA: Insufficient documentation

## 2015-09-13 DIAGNOSIS — M1990 Unspecified osteoarthritis, unspecified site: Secondary | ICD-10-CM | POA: Insufficient documentation

## 2015-09-13 DIAGNOSIS — M545 Low back pain: Secondary | ICD-10-CM | POA: Diagnosis not present

## 2015-09-13 DIAGNOSIS — Z87442 Personal history of urinary calculi: Secondary | ICD-10-CM | POA: Diagnosis not present

## 2015-09-13 DIAGNOSIS — W07XXXA Fall from chair, initial encounter: Secondary | ICD-10-CM | POA: Diagnosis not present

## 2015-09-13 DIAGNOSIS — W19XXXA Unspecified fall, initial encounter: Secondary | ICD-10-CM

## 2015-09-13 DIAGNOSIS — G4733 Obstructive sleep apnea (adult) (pediatric): Secondary | ICD-10-CM | POA: Diagnosis not present

## 2015-09-13 DIAGNOSIS — F419 Anxiety disorder, unspecified: Secondary | ICD-10-CM | POA: Diagnosis not present

## 2015-09-13 DIAGNOSIS — F329 Major depressive disorder, single episode, unspecified: Secondary | ICD-10-CM | POA: Insufficient documentation

## 2015-09-13 DIAGNOSIS — Z791 Long term (current) use of non-steroidal anti-inflammatories (NSAID): Secondary | ICD-10-CM | POA: Insufficient documentation

## 2015-09-13 DIAGNOSIS — J449 Chronic obstructive pulmonary disease, unspecified: Secondary | ICD-10-CM | POA: Insufficient documentation

## 2015-09-13 DIAGNOSIS — S3992XA Unspecified injury of lower back, initial encounter: Secondary | ICD-10-CM | POA: Diagnosis not present

## 2015-09-13 DIAGNOSIS — Z9981 Dependence on supplemental oxygen: Secondary | ICD-10-CM | POA: Insufficient documentation

## 2015-09-13 DIAGNOSIS — Z880 Allergy status to penicillin: Secondary | ICD-10-CM | POA: Diagnosis not present

## 2015-09-13 DIAGNOSIS — S098XXA Other specified injuries of head, initial encounter: Secondary | ICD-10-CM | POA: Diagnosis not present

## 2015-09-13 DIAGNOSIS — R51 Headache: Secondary | ICD-10-CM | POA: Diagnosis not present

## 2015-09-13 DIAGNOSIS — Y9389 Activity, other specified: Secondary | ICD-10-CM | POA: Diagnosis not present

## 2015-09-13 MED ORDER — ACETAMINOPHEN 325 MG PO TABS
650.0000 mg | ORAL_TABLET | Freq: Once | ORAL | Status: AC
  Administered 2015-09-13: 650 mg via ORAL
  Filled 2015-09-13: qty 2

## 2015-09-13 NOTE — ED Notes (Signed)
Per [facility], pt was in the dining room @ [facility], has hx of knee problems, the chair rolled out from under her causing her to hit her head behind the right ear, on no blood thinners, c/o back & LLE pain, denies LOC, no deformities noted, was ambulatory on scene.

## 2015-09-13 NOTE — ED Notes (Signed)
Patient transported to X-ray 

## 2015-09-13 NOTE — ED Notes (Signed)
Notified [facility] for transportation 

## 2015-09-13 NOTE — Discharge Instructions (Signed)
You were evaluated in the ED today and there does not appear to be an emergent cause for her symptoms at this time. Your exam, x-rays of your spine and CT scan of your head are all reassuring. Please follow-up with your doctors as needed for reevaluation. Return to ED for worsening symptoms.  Fall Prevention and Home Safety Falls cause injuries and can affect all age groups. It is possible to use preventive measures to significantly decrease the likelihood of falls. There are many simple measures which can make your home safer and prevent falls. OUTDOORS  Repair cracks and edges of walkways and driveways.  Remove high doorway thresholds.  Trim shrubbery on the main path into your home.  Have good outside lighting.  Clear walkways of tools, rocks, debris, and clutter.  Check that handrails are not broken and are securely fastened. Both sides of steps should have handrails.  Have leaves, snow, and ice cleared regularly.  Use sand or salt on walkways during winter months.  In the garage, clean up grease or oil spills. BATHROOM  Install night lights.  Install grab bars by the toilet and in the tub and shower.  Use non-skid mats or decals in the tub or shower.  Place a plastic non-slip stool in the shower to sit on, if needed.  Keep floors dry and clean up all water on the floor immediately.  Remove soap buildup in the tub or shower on a regular basis.  Secure bath mats with non-slip, double-sided rug tape.  Remove throw rugs and tripping hazards from the floors. BEDROOMS  Install night lights.  Make sure a bedside light is easy to reach.  Do not use oversized bedding.  Keep a telephone by your bedside.  Have a firm chair with side arms to use for getting dressed.  Remove throw rugs and tripping hazards from the floor. KITCHEN  Keep handles on pots and pans turned toward the center of the stove. Use back burners when possible.  Clean up spills quickly and allow  time for drying.  Avoid walking on wet floors.  Avoid hot utensils and knives.  Position shelves so they are not too high or low.  Place commonly used objects within easy reach.  If necessary, use a sturdy step stool with a grab bar when reaching.  Keep electrical cables out of the way.  Do not use floor polish or wax that makes floors slippery. If you must use wax, use non-skid floor wax.  Remove throw rugs and tripping hazards from the floor. STAIRWAYS  Never leave objects on stairs.  Place handrails on both sides of stairways and use them. Fix any loose handrails. Make sure handrails on both sides of the stairways are as long as the stairs.  Check carpeting to make sure it is firmly attached along stairs. Make repairs to worn or loose carpet promptly.  Avoid placing throw rugs at the top or bottom of stairways, or properly secure the rug with carpet tape to prevent slippage. Get rid of throw rugs, if possible.  Have an electrician put in a light switch at the top and bottom of the stairs. OTHER FALL PREVENTION TIPS  Wear low-heel or rubber-soled shoes that are supportive and fit well. Wear closed toe shoes.  When using a stepladder, make sure it is fully opened and both spreaders are firmly locked. Do not climb a closed stepladder.  Add color or contrast paint or tape to grab bars and handrails in your home. Place contrasting  color strips on first and last steps.  Learn and use mobility aids as needed. Install an electrical emergency response system.  Turn on lights to avoid dark areas. Replace light bulbs that burn out immediately. Get light switches that glow.  Arrange furniture to create clear pathways. Keep furniture in the same place.  Firmly attach carpet with non-skid or double-sided tape.  Eliminate uneven floor surfaces.  Select a carpet pattern that does not visually hide the edge of steps.  Be aware of all pets. OTHER HOME SAFETY TIPS  Set the water  temperature for 120 F (48.8 C).  Keep emergency numbers on or near the telephone.  Keep smoke detectors on every level of the home and near sleeping areas. Document Released: 11/29/2002 Document Revised: 06/09/2012 Document Reviewed: 02/28/2012 [facility] Patient Information 2015 [place], [place]. This information is not intended to replace advice given to you by your health care provider. Make sure you discuss any questions you have with your health care provider.

## 2015-09-13 NOTE — ED Notes (Signed)
Bed: [facility] Expected date:  Expected time:  Means of arrival:  Comments: Ems-fall 

## 2015-09-13 NOTE — ED Provider Notes (Signed)
CSN: [id number]     Arrival date & time 09/13/15  1738 History   First MD Initiated Contact with Patient 09/13/15 1800     Chief Complaint  Patient presents with  . Fall  . Back Pain  . Head Injury    behind right ear     (Consider location/radiation/quality/duration/timing/severity/associated sxs/prior Treatment) HPI Linda [name] is a 69 y.o. female who comes in from [place] [facility] facility for evaluation after a fall. Patient states approximately 30 minutes ago she was eating in her dining room when she sat down in her chair fell out from under her. She reports falling backwards landing on her lower back and hitting the back of her head on the floor. At this time she reports low back pain, neck pain and headache. She denies any loss of consciousness, numbness or weakness. No anticoagulation. Rates her overall discomfort as an 8/10. Also reports she was immediately ambulatory following the accident.  Past Medical History  Diagnosis Date  . HTN (hypertension)   . Depression   . Recurrent UTI     hx of hospitalization for pyelonephritis; started abx prophylaxis 06/2015  . Hay fever   . Mixed incontinence urge and stress   . Diverticular disease   . Insomnia   . Fibromyalgia     Patient states dx was around her late 66s but she had sx's for years prior to this.  . Syncope     Hypotensive; ED visit--Dr. Terrence Dupont did Cath--nonobstructive CAD, EF 55-60%.  In retrospect, suspect pt rx med misuse/polypharmacy  . Idiopathic angio-edema-urticaria 72014    Angioedema component was very minimal  . Asthma     w/ asbestososis   . History of pneumonia     hospitalized 12/2011, 02/2013, and 07/2013 [facility]) for this  . Anginal pain     Nonobstructive CAD 2014; however, her cardiologist put her on a statin for this and NOT for hyperlipidemia per pt report.  . OSA on CPAP     prior to move to [place]--had another sleep study 10/2015 w/pulm Dr. Camillo Flaming.  . Iron deficiency anemia     Hematologist  in [place], [place] did extensive w/u; no cause found; failed oral supplement;; gets fairly regular (q79m or so) IV iron infusions (Venofer iron sucrose} 200mg  with procrit.  "for 14 yr I've been getting blood work q month & getting infusions prn" (07/12/2013).  Dr. Marin Olp locally, iron infusions done, EPO deficiency dx'd  . H/O hiatal hernia   . Migraine syndrome     "not as often anymore; used to be ~ q wk" (07/12/2013)  . Tension headache, chronic   . DDD (degenerative disc disease)     lumbar and cervical.   . Osteoarthritis     "severe; progressing fast" (07/12/2013); multiple joints-not surgical candidate for TKR (03/2015)  . Chronic lower back pain   . Anxiety     panic attacks  . Nephrolithiasis     "passed all on my own or they are still in there" (07/12/2013)  . Pyelonephritis     "several times over the last 30 yr" (07/12/2013)  . Diastolic congestive heart failure   . COPD (chronic obstructive pulmonary disease)   . Pulmonary embolism 07/2013    Dx at [facility] with very small peripheral upper lobe pe 07/2013: pt took coumadin for about 8-9 mo  . Pleural plaque with presence of asbestos 07/22/2013  . BPPV (benign paroxysmal positional vertigo) 12/16/2012  . RBBB (right bundle branch block)   .  Pernicious anemia 08/24/2014  . Acute upper GI bleed 06/2014    while pt taking coumadin, plavix, and meloxicam---despite being told not to take coumadin.   Past Surgical History  Procedure Laterality Date  . Appendectomy  1960  . Total abdominal hysterectomy  1974  . Tendon release  1996    Right forearm and hand  . Knee surgery  2005  . Heel spur surgery Left 2008  . Plantar fascia release Left 2008  . Axillary surgery Left 1978    Multiple "lump" in armpit per pt  . Coccyx removal  1972  . Cardiac catheterization  01/2013    nonobstructive CAD, EF 55-60%  . Transthoracic echocardiogram  01/2013; 04/2014    2014--NORMAL.  2015--focal basal septal hypertrophy, EF 55-60%, grade I diast dysfxn,  mild LAE.    . Dilation and curettage of uterus  ? 1970's  . Eye surgery Left 2012-2013    "injections for ~ 1 yr; don't really know what for" (07/12/2013)  . Spirometry  04/25/14    In hosp for acute asthma/COPD flare: mixed obstructive and restrictive lung disease. The FEV1 is severely reduced at 45% predicted.  FEV1 signif decreased compared to prior spirometry 07/23/13.  [name] Kitchen Esophagogastroduodenoscopy N/A 07/19/2014    Gastritis found + in the setting of supratherapeutic INR, +plavix, + meloxicam.  . Left heart catheterization with coronary angiogram N/A 01/30/2013    Procedure: LEFT HEART CATHETERIZATION WITH CORONARY ANGIOGRAM;  Surgeon: Clent Demark, MD;  Location: [facility] CATH LAB;  Service: Cardiovascular;  Laterality: N/A;  . Cardiovascular stress test  02/22/15    Low risk myocard perf imaging; wall motion normal, normal EF   Family History  Problem Relation Age of Onset  . Arthritis Mother   . Kidney disease Mother   . Heart disease Father   . Stroke Father   . Hypertension Father   . Diabetes Father    Social History  Substance Use Topics  . Smoking status: Never Smoker   . Smokeless tobacco: Never Used     Comment: never used tobacco  . Alcohol Use: No   OB History    No data available     Review of Systems A 10 point review of systems was completed and was negative except for pertinent positives and negatives as mentioned in the history of present illness     Allergies  Oxycodone and Penicillins  Home Medications   Prior to Admission medications   Medication Sig Start Date End Date Taking? Authorizing Provider  ALPRAZolam (XANAX) 1 MG tablet TAKE 1 TABLET 3 TIMES A DAY. Patient taking differently: TAKE 1 MG BY MOUTH THREE TIMES DAILY AS NEEDED FOR ANXIETY 04/07/15  Yes Tammi Sou, MD  ARIPiprazole (ABILIFY) 5 MG tablet Take 1 tablet (5 mg total) by mouth daily. 09/07/15  Yes Tammi Sou, MD  atorvastatin (LIPITOR) 20 MG tablet Take 1 tablet (20 mg total) by  mouth daily at 6 PM. 05/03/15  Yes Tammi Sou, MD  budesonide-formoterol [facility]) 160-4.5 MCG/ACT inhaler Inhale 2 puffs into the lungs 2 (two) times daily. 10/06/14  Yes Irene Pap, NP  Cholecalciferol (VITAMIN D3) 50000 UNITS CAPS Take 50,000 Units by mouth once a week. 12/02/14  Yes Tammi Sou, MD  diclofenac sodium (VOLTAREN) 1 % GEL Apply 4 g topically 3 (three) times daily. TO BOTH KNEES Patient taking differently: Apply 4 g topically daily as needed (knee pain).  01/16/15  Yes Meredith Staggers, MD  DULoxetine (  CYMBALTA) 30 MG capsule 1 cap po qd to be combined with a 60mg  duloxetine cap Patient taking differently: Take 30 mg by mouth daily. to be combined with a 60mg  duloxetine cap 03/20/15  Yes Tammi Sou, MD  DULoxetine (CYMBALTA) 60 MG capsule 1 cap po qd to be combined with a 30 mg duloxetine cap daily Patient taking differently: Take 60 mg by mouth daily. to be combined with a 30 mg duloxetine cap daily 05/03/15  Yes Tammi Sou, MD  fentaNYL (DURAGESIC - DOSED MCG/HR) 12 MCG/HR Place 1 patch (12.5 mcg total) onto the skin every 3 (three) days. 09/08/15  Yes Bayard Hugger, NP  folic acid (FOLVITE) 1 MG tablet Take 2 tablets (2 mg total) by mouth daily. 09/15/14  Yes Tammi Sou, MD  furosemide (LASIX) 20 MG tablet Take 1 tablet (20 mg total) by mouth daily. 08/16/15  Yes Lelon Perla, MD  isosorbide mononitrate (IMDUR) 30 MG 24 hr tablet Take 1 tablet (30 mg total) by mouth daily. 05/03/15  Yes Tammi Sou, MD  nebivolol (BYSTOLIC) 5 MG tablet Take 1 tablet (5 mg total) by mouth daily. 05/03/15  Yes Tammi Sou, MD  nitroGLYCERIN (NITROSTAT) 0.4 MG SL tablet Place 0.4 mg under the tongue every 5 (five) minutes as needed for chest pain.   Yes Historical Provider, MD  ondansetron (ZOFRAN) 4 MG tablet Take 1 tablet (4 mg total) by mouth every 6 (six) hours. Patient taking differently: Take 4 mg by mouth every 6 (six) hours as needed for nausea or  vomiting.  09/15/14  Yes Hyman Bible, PA-C  oxyCODONE-acetaminophen (PERCOCET) 10-325 MG per tablet Take 1 tablet by mouth every 6 (six) hours as needed for pain. ONE MONTH SUPPLY 09/08/15  Yes Bayard Hugger, NP  pantoprazole (PROTONIX) 40 MG tablet Take 1 tablet (40 mg total) by mouth daily. 05/03/15  Yes Tammi Sou, MD  polyethylene glycol powder (GLYCOLAX/MIRALAX) powder Take 17 g by mouth daily. Patient taking differently: Take 17 g by mouth daily as needed for mild constipation or moderate constipation.  09/15/14  Yes Tammi Sou, MD  pregabalin (LYRICA) 300 MG capsule Take 1 capsule twice a day Patient taking differently: Take 300 mg by mouth 2 (two) times daily.  04/27/15  Yes Volanda Napoleon, MD  sulfamethoxazole-trimethoprim (BACTRIM DS,SEPTRA DS) 800-160 MG per tablet Take 1 tablet by mouth daily. 07/26/15  Yes Tammi Sou, MD  traZODone (DESYREL) 50 MG tablet Take 3 tablets (150 mg total) by mouth at bedtime. 06/29/15  Yes Tammi Sou, MD  albuterol (PROVENTIL HFA;VENTOLIN HFA) 108 (90 BASE) MCG/ACT inhaler Inhale 2 puffs into the lungs every 6 (six) hours as needed for wheezing or shortness of breath. Patient not taking: Reported on 09/13/2015 09/15/14   Tammi Sou, MD  albuterol (PROVENTIL) (2.5 MG/3ML) 0.083% nebulizer solution Take 3 mLs (2.5 mg total) by nebulization every 6 (six) hours as needed for wheezing or shortness of breath. Patient not taking: Reported on 09/13/2015 04/07/15   Tammi Sou, MD  lidocaine (LIDODERM) 5 % Place 1 patch onto the skin daily. Remove & Discard patch within 12 hours or as directed by MD Patient not taking: Reported on 09/13/2015 08/11/15   Meredith Staggers, MD   BP 132/78 mmHg  Pulse 70  Temp(Src) 98.1 F (36.7 C) (Oral)  Resp 20  Ht 5\' 4"  (1.626 m)  Wt 170 lb (77.111 kg)  BMI 29.17 kg/m2  SpO2 100%  Physical Exam  Constitutional: She is oriented to person, place, and time. She appears well-developed and well-nourished.   HENT:  Head: Normocephalic and atraumatic.  Mouth/Throat: Oropharynx is clear and moist.  No battle sign, raccoon eyes. No calvarial depressions.  Eyes: Conjunctivae are normal. Pupils are equal, round, and reactive to light. Right eye exhibits no discharge. Left eye exhibits no discharge. No scleral icterus.  Neck: Normal range of motion. Neck supple. No tracheal deviation present.  No midline bony cervical tenderness. Patient is able to flex and extend neck without difficulty. Also able to rotate 45 both right and left without discomfort or difficulty.  Cardiovascular: Normal rate, regular rhythm and normal heart sounds.   Pulmonary/Chest: Effort normal and breath sounds normal. No respiratory distress. She has no wheezes. She has no rales. She exhibits no tenderness.  Home oxygen 3 L/min.  Abdominal: Soft. There is no tenderness.  Musculoskeletal: Normal range of motion. She exhibits no edema or tenderness.  Maintains full active range of motion of all 4 extremities without any focal tenderness. She does have mild tenderness over L4-L5 lumbar spine. No crepitus or bony abnormalities noted.  Neurological: She is alert and oriented to person, place, and time.  Cranial Nerves II-XII grossly intact. Motor strength is intact and 5/5 in all 4 charities. Sensation intact to light touch.  Skin: Skin is warm and dry. No rash noted.  Psychiatric: She has a normal mood and affect.  Nursing note and vitals reviewed.   ED Course  Procedures (including critical care time) Labs Review Labs Reviewed - No data to display  Imaging Review Dg Lumbar Spine Complete  09/13/2015   CLINICAL DATA:  [place] off a chair in dinner at home today, lower lumbar pain greater on LEFT, pain down LEFT leg  EXAM: LUMBAR SPINE - COMPLETE 4+ VIEW  COMPARISON:  07/18/2014  FINDINGS: Five non-rib-bearing lumbar vertebra.  Osseous demineralization.  Facet degenerative changes lower lumbar spine.  Mild anterolisthesis at L5-S1  approximately 5 mm, grossly stable.  Vertebral body heights maintained.  No acute fracture, subluxation or bone destruction.  No spondylolysis.  SI joints symmetric.  IMPRESSION: Facet degenerative changes lower lumbar spine with approximately 5 mm of anterolisthesis at L5-S1, grossly unchanged.  No acute abnormalities.   Electronically Signed   By: Lavonia Dana M.D.   On: 09/13/2015 18:53   Ct Head Wo Contrast  09/13/2015   CLINICAL DATA:  Chair rolled out from under her dining room at home causing her to fall striking head behind RIGHT ear, no loss of consciousness, hypertension, asthma, COPD, prior pulmonary embolism, CHF  EXAM: CT HEAD WITHOUT CONTRAST  CT CERVICAL SPINE WITHOUT CONTRAST  TECHNIQUE: Multidetector CT imaging of the head and cervical spine was performed following the standard protocol without intravenous contrast. Multiplanar CT image reconstructions of the cervical spine were also generated.  COMPARISON:  07/17/2014 CT head, CT cervical spine 01/30/2013  FINDINGS: CT HEAD FINDINGS  Mild generalized atrophy.  Normal ventricular morphology.  No midline shift or mass effect.  Otherwise normal appearance of brain parenchyma.  No intracranial hemorrhage, mass lesion, or acute infarction.  Visualized paranasal sinuses and mastoid air cells clear.  Bones unremarkable.  CT CERVICAL SPINE FINDINGS  Atherosclerotic calcifications particularly at RIGHT carotid bifurcation.  Prevertebral soft tissues normal thickness.  Bones demineralized.  Disc space narrowing with endplate spur formation at C5-C6 and C6-C7, less C4-C5.  Minimal anterolisthesis C4-C5.  Vertebral body heights maintained without fracture or subluxation.  Multilevel facet degenerative  changes.  Encroachment of uncovertebral spurs upon scattered neural foramina notably RIGHT C5-C6.  Lung apices clear.  No acute osseous findings identified.  IMPRESSION: Mild generalized atrophy.  No acute intracranial abnormalities.  Multilevel degenerative  disc and facet disease changes of the cervical spine.  No acute cervical spine abnormalities.   Electronically Signed   By: Lavonia Dana M.D.   On: 09/13/2015 19:04   Ct Cervical Spine Wo Contrast  09/13/2015   CLINICAL DATA:  Chair rolled out from under her dining room at home causing her to fall striking head behind RIGHT ear, no loss of consciousness, hypertension, asthma, COPD, prior pulmonary embolism, CHF  EXAM: CT HEAD WITHOUT CONTRAST  CT CERVICAL SPINE WITHOUT CONTRAST  TECHNIQUE: Multidetector CT imaging of the head and cervical spine was performed following the standard protocol without intravenous contrast. Multiplanar CT image reconstructions of the cervical spine were also generated.  COMPARISON:  07/17/2014 CT head, CT cervical spine 01/30/2013  FINDINGS: CT HEAD FINDINGS  Mild generalized atrophy.  Normal ventricular morphology.  No midline shift or mass effect.  Otherwise normal appearance of brain parenchyma.  No intracranial hemorrhage, mass lesion, or acute infarction.  Visualized paranasal sinuses and mastoid air cells clear.  Bones unremarkable.  CT CERVICAL SPINE FINDINGS  Atherosclerotic calcifications particularly at RIGHT carotid bifurcation.  Prevertebral soft tissues normal thickness.  Bones demineralized.  Disc space narrowing with endplate spur formation at C5-C6 and C6-C7, less C4-C5.  Minimal anterolisthesis C4-C5.  Vertebral body heights maintained without fracture or subluxation.  Multilevel facet degenerative changes.  Encroachment of uncovertebral spurs upon scattered neural foramina notably RIGHT C5-C6.  Lung apices clear.  No acute osseous findings identified.  IMPRESSION: Mild generalized atrophy.  No acute intracranial abnormalities.  Multilevel degenerative disc and facet disease changes of the cervical spine.  No acute cervical spine abnormalities.   Electronically Signed   By: Lavonia Dana M.D.   On: 09/13/2015 19:04   I have personally reviewed and evaluated these  images and lab results as part of my medical decision-making.   EKG Interpretation None     Filed Vitals:   09/13/15 1746 09/13/15 1802 09/13/15 2054  BP:  165/85 132/78  Pulse:  66 70  Temp:  98.6 F (37 C) 98.1 F (36.7 C)  TempSrc:  Oral Oral  Resp:   20  Height:  5\' 4"  (1.626 m)   Weight:  170 lb (77.111 kg)   SpO2: 99% 100% 100%    MDM  Linda [name] is a 69 y.o. female who presents to the ED after a mechanical fall. Patient fell backwards missing her chair as she was sitting down and hit the back of her head as well as her lower back. No anticoagulation  Vitals stable - WNL -afebrile Pt resting comfortably in ED. PE--normal neuro exam, no evidence of basilar skull fracture. Physical exam otherwise unremarkable. Imaging--CT of head and neck are negative for any acute intracranial pathology or any cervical pathology. Plain films of lumbar spine are also negative for any osseous abnormalities  Patient appears well, is appropriate for discharge to follow-up with her PCP this week for further evaluation and management of symptoms. Encouraged Tylenol home for pain.  I discussed all relevant lab findings and imaging results with pt and they verbalized understanding. Discussed f/u with PCP within 48 hrs and return precautions, pt very amenable to plan. Prior to patient discharge, I discussed and reviewed this case with Dr. Mingo Amber    Final diagnoses:  Fall, initial encounter        Comer Locket, PA-C 09/14/15 [place], MD 09/14/15 [phone]

## 2015-09-28 ENCOUNTER — Other Ambulatory Visit: Payer: Self-pay | Admitting: Family Medicine

## 2015-09-28 NOTE — Telephone Encounter (Signed)
RF request for alprazolam LOV: 09/07/15 Next ov: 10/19/15 Last written: 04/07/15 #90 w/ 5RF Please advise. Thanks.

## 2015-09-29 NOTE — Telephone Encounter (Signed)
Rx faxed

## 2015-10-04 DIAGNOSIS — M1712 Unilateral primary osteoarthritis, left knee: Secondary | ICD-10-CM | POA: Diagnosis not present

## 2015-10-04 DIAGNOSIS — M170 Bilateral primary osteoarthritis of knee: Secondary | ICD-10-CM | POA: Diagnosis not present

## 2015-10-04 DIAGNOSIS — M1711 Unilateral primary osteoarthritis, right knee: Secondary | ICD-10-CM | POA: Diagnosis not present

## 2015-10-06 ENCOUNTER — Encounter: Payer: Medicare Other | Attending: Physical Medicine & Rehabilitation | Admitting: Registered Nurse

## 2015-10-06 ENCOUNTER — Other Ambulatory Visit: Payer: Self-pay | Admitting: Registered Nurse

## 2015-10-06 ENCOUNTER — Encounter: Payer: Self-pay | Admitting: Registered Nurse

## 2015-10-06 VITALS — BP 141/72 | HR 85

## 2015-10-06 DIAGNOSIS — M5440 Lumbago with sciatica, unspecified side: Secondary | ICD-10-CM

## 2015-10-06 DIAGNOSIS — R269 Unspecified abnormalities of gait and mobility: Secondary | ICD-10-CM | POA: Diagnosis not present

## 2015-10-06 DIAGNOSIS — R2689 Other abnormalities of gait and mobility: Secondary | ICD-10-CM

## 2015-10-06 DIAGNOSIS — J9610 Chronic respiratory failure, unspecified whether with hypoxia or hypercapnia: Secondary | ICD-10-CM

## 2015-10-06 DIAGNOSIS — Z79899 Other long term (current) drug therapy: Secondary | ICD-10-CM | POA: Diagnosis not present

## 2015-10-06 DIAGNOSIS — I2510 Atherosclerotic heart disease of native coronary artery without angina pectoris: Secondary | ICD-10-CM | POA: Diagnosis not present

## 2015-10-06 DIAGNOSIS — Z5181 Encounter for therapeutic drug level monitoring: Secondary | ICD-10-CM

## 2015-10-06 DIAGNOSIS — M797 Fibromyalgia: Secondary | ICD-10-CM

## 2015-10-06 DIAGNOSIS — G894 Chronic pain syndrome: Secondary | ICD-10-CM | POA: Diagnosis not present

## 2015-10-06 DIAGNOSIS — M1711 Unilateral primary osteoarthritis, right knee: Secondary | ICD-10-CM

## 2015-10-06 MED ORDER — FENTANYL 12 MCG/HR TD PT72: 12.5000 ug | MEDICATED_PATCH | TRANSDERMAL | Status: DC

## 2015-10-06 MED ORDER — OXYCODONE-ACETAMINOPHEN 10-325 MG PO TABS: 1.0000 | ORAL_TABLET | Freq: Four times a day (QID) | ORAL | Status: DC | PRN

## 2015-10-06 NOTE — Progress Notes (Signed)
Subjective:    Patient ID: Linda [name], female    DOB: 1946/07/26, 69 y.o.   MRN: [id number]  HPI: Linda [name] is a 69 year old female who returns for follow up for chronic pain and medication refill. She says her pain is located in her lower back radiating into her right lower extremity posteriorly and bilateral knees. She rates her pain 7. Her current exercise regime is walking for short distances with her cadillac walker. Also states s/p  Synvisc injection's bilateral knees with Dr. Wynelle Link on 10/04/2015. Went to [facility] ED on 09/13/2015 s/p fall.  Pain Inventory Average Pain 6 Pain Right Now 7 My pain is sharp, stabbing and aching  In the last 24 hours, has pain interfered with the following? General activity 9 Relation with others 9 Enjoyment of life 9 What TIME of day is your pain at its worst? 24 hours a day Sleep (in general) Fair  Pain is worse with: walking, bending, standing and some activites Pain improves with: rest, heat/ice and medication Relief from Meds: 6  Mobility use a walker how many minutes can you walk? 5 ability to climb steps?  no do you drive?  yes Do you have any goals in this area?  no  Function retired  Neuro/Psych bladder control problems weakness trouble walking depression anxiety  Prior Studies Any changes since last visit?  yes  Physicians involved in your care .   Family History  Problem Relation Age of Onset  . Arthritis Mother   . Kidney disease Mother   . Heart disease Father   . Stroke Father   . Hypertension Father   . Diabetes Father    Social History   Social History  . Marital Status: Widowed    Spouse Name: N/A  . Number of Children: 2  . Years of Education: N/A   Social History Main Topics  . Smoking status: Never Smoker   . Smokeless tobacco: Never Used     Comment: never used tobacco  . Alcohol Use: No  . Drug Use: No  . Sexual Activity: Not Currently   Other Topics Concern  .  Not on file   Social History Narrative   Widowed, 2 sons.  Relocated to [place] 09/2012 to be closer to her son who has MS.   Husband d 2015--mesothelioma.   Occupation: former Pharmacist, hospital.   Education: masters degree level.   No T/A/Ds.   Past Surgical History  Procedure Laterality Date  . Appendectomy  1960  . Total abdominal hysterectomy  1974  . Tendon release  1996    Right forearm and hand  . Knee surgery  2005  . Heel spur surgery Left 2008  . Plantar fascia release Left 2008  . Axillary surgery Left 1978    Multiple "lump" in armpit per pt  . Coccyx removal  1972  . Cardiac catheterization  01/2013    nonobstructive CAD, EF 55-60%  . Transthoracic echocardiogram  01/2013; 04/2014    2014--NORMAL.  2015--focal basal septal hypertrophy, EF 55-60%, grade I diast dysfxn, mild LAE.    . Dilation and curettage of uterus  ? 1970's  . Eye surgery Left 2012-2013    "injections for ~ 1 yr; don't really know what for" (07/12/2013)  . Spirometry  04/25/14    In hosp for acute asthma/COPD flare: mixed obstructive and restrictive lung disease. The FEV1 is severely reduced at 45% predicted.  FEV1 signif decreased compared to prior spirometry 07/23/13.  [name] Kitchen  Esophagogastroduodenoscopy N/A 07/19/2014    Gastritis found + in the setting of supratherapeutic INR, +plavix, + meloxicam.  . Left heart catheterization with coronary angiogram N/A 01/30/2013    Procedure: LEFT HEART CATHETERIZATION WITH CORONARY ANGIOGRAM;  Surgeon: Clent Demark, MD;  Location: [facility] CATH LAB;  Service: Cardiovascular;  Laterality: N/A;  . Cardiovascular stress test  02/22/15    Low risk myocard perf imaging; wall motion normal, normal EF    Opioid Risk Score:   Fall Risk Score:  `1  Depression screen PHQ 2/9  Depression screen [facility] 2/9 08/11/2015 07/12/2015 03/27/2015 03/31/2014  Decreased Interest 3 0 3 0  Down, Depressed, Hopeless 3 1 3  0  PHQ - 2 Score 6 1 6  0  Altered sleeping 2 - 2 -  Tired, decreased energy 3 - 3 -  Change in  appetite 0 - 2 -  Feeling bad or failure about yourself  2 - 2 -  Trouble concentrating 0 - 2 -  Moving slowly or fidgety/restless 0 - 0 -  Suicidal thoughts 0 - 0 -  PHQ-9 Score 13 - 17 -     Review of Systems  Respiratory: Positive for shortness of breath.   Genitourinary: Positive for dysuria.  All other systems reviewed and are negative.      Objective:   Physical Exam  Constitutional: She is oriented to person, place, and time. She appears well-developed and well-nourished.  HENT:  Head: Normocephalic and atraumatic.  Neck: Normal range of motion. Neck supple.  Cardiovascular: Normal rate and regular rhythm.   Pulmonary/Chest: Effort normal and breath sounds normal.  Musculoskeletal:  Normal Muscle Bulk and Muscle Testing Reveals: Upper Extremities: Full ROM and Muscle Strength 5/5 Lumbar Paraspinal Tenderness: L-3- L-5 Lower Extremities: Full ROM and Muscle Strength 5/5 Arises from chair slowly   Antalgic Gait  Neurological: She is alert and oriented to person, place, and time.  Skin: Skin is warm and dry.  Psychiatric: She has a normal mood and affect.  Nursing note and vitals reviewed.         Assessment & Plan:  1. Functional deficits secondary to Gait disorder: Continue with HEP. 2. Chronic Back pain/fibromyalgia/R>L Knee OA Pain Management:  Refilled: Oxycodone 10/325 one tablet every 6 hours #100 and Fentanyl 12 mcg one patch every three days #10. Continue Lyrica, Lidoderm patches and Voltaren Gel  3. Depression with anxiety/Grief reaction/Mood: Continue Trazodone and Cymbalta .  4. [name] with asthma: Oxygen dependent. Albuterol prn.  On Continuous Oxygen Therapy 3 liters/ Pulmonology Following. 5. Bilateral Osteoarthritis of Bilateral Knees/S/P Synvisc Dr. Juanda Bond Following. 6. Fibromyalgia: Continue with exercise regime.  20 minutes of face to face patient care time was spent during this visit. All questions were encouraged and  answered.

## 2015-10-06 NOTE — Addendum Note (Signed)
Addended by: Geryl Rankins D on: 10/06/2015 02:26 PM   Modules accepted: Orders

## 2015-10-07 LAB — PMP ALCOHOL METABOLITE (ETG): ETGU: NEGATIVE ng/mL

## 2015-10-09 ENCOUNTER — Telehealth: Payer: Self-pay | Admitting: Cardiology

## 2015-10-09 NOTE — Telephone Encounter (Signed)
Continue lasix 20 mg daily and follow symptoms /Traeh Milroy [place] Breed

## 2015-10-09 NOTE — Telephone Encounter (Signed)
Returning your call. °

## 2015-10-09 NOTE — Telephone Encounter (Signed)
Returned call to patient she has increased swelling in lower legs and feet for the past 5 days,sob,increase of 4 lbs within the past 2 days.Stated she was on a cruise 2 weeks ago and did not take any lasix for 4 days,but she is back taking lasix 20 mg daily, low salt diet.Message sent to [facility] for advice.

## 2015-10-09 NOTE — Telephone Encounter (Signed)
Linda [name] is calling because her swelling has increased in her feet, ankles and legs . Please call   Thanks

## 2015-10-09 NOTE — Telephone Encounter (Signed)
Left message for pt to call.

## 2015-10-09 NOTE — Telephone Encounter (Signed)
Spoke with pt, Aware of dr crenshaw's recommendations.  °

## 2015-10-11 ENCOUNTER — Telehealth: Payer: Self-pay | Admitting: *Deleted

## 2015-10-11 DIAGNOSIS — J984 Other disorders of lung: Secondary | ICD-10-CM | POA: Diagnosis not present

## 2015-10-11 DIAGNOSIS — J61 Pneumoconiosis due to asbestos and other mineral fibers: Secondary | ICD-10-CM | POA: Diagnosis not present

## 2015-10-11 DIAGNOSIS — G4733 Obstructive sleep apnea (adult) (pediatric): Secondary | ICD-10-CM | POA: Diagnosis not present

## 2015-10-11 DIAGNOSIS — R0902 Hypoxemia: Secondary | ICD-10-CM | POA: Diagnosis not present

## 2015-10-11 NOTE — Telephone Encounter (Signed)
Spoke with pt, she was seen by her pulmonary doctor today and he is having her take an extra dose of lasix today due to the swelling in her feet and legs and told her to double her lasix dose. Explained to pt to only take the extra lasix dose for three days and if cont to have problems to call. She will come to the office the first of the week for bmp. Will make dr Stanford Breed aware.

## 2015-10-12 ENCOUNTER — Encounter (HOSPITAL_COMMUNITY): Payer: Self-pay

## 2015-10-12 ENCOUNTER — Emergency Department (HOSPITAL_COMMUNITY)
Admission: EM | Admit: 2015-10-12 | Discharge: 2015-10-12 | Disposition: A | Discharge status: Home / Self Care | Payer: Medicare Other | Attending: Emergency Medicine | Admitting: Emergency Medicine

## 2015-10-12 DIAGNOSIS — I5030 Unspecified diastolic (congestive) heart failure: Secondary | ICD-10-CM | POA: Diagnosis not present

## 2015-10-12 DIAGNOSIS — Z79899 Other long term (current) drug therapy: Secondary | ICD-10-CM | POA: Diagnosis not present

## 2015-10-12 DIAGNOSIS — Z8701 Personal history of pneumonia (recurrent): Secondary | ICD-10-CM | POA: Insufficient documentation

## 2015-10-12 DIAGNOSIS — Z87442 Personal history of urinary calculi: Secondary | ICD-10-CM | POA: Diagnosis not present

## 2015-10-12 DIAGNOSIS — F329 Major depressive disorder, single episode, unspecified: Secondary | ICD-10-CM | POA: Diagnosis not present

## 2015-10-12 DIAGNOSIS — M1712 Unilateral primary osteoarthritis, left knee: Secondary | ICD-10-CM | POA: Diagnosis not present

## 2015-10-12 DIAGNOSIS — Z791 Long term (current) use of non-steroidal anti-inflammatories (NSAID): Secondary | ICD-10-CM | POA: Insufficient documentation

## 2015-10-12 DIAGNOSIS — F410 Panic disorder [episodic paroxysmal anxiety] without agoraphobia: Secondary | ICD-10-CM | POA: Insufficient documentation

## 2015-10-12 DIAGNOSIS — D509 Iron deficiency anemia, unspecified: Secondary | ICD-10-CM | POA: Insufficient documentation

## 2015-10-12 DIAGNOSIS — Z8669 Personal history of other diseases of the nervous system and sense organs: Secondary | ICD-10-CM | POA: Insufficient documentation

## 2015-10-12 DIAGNOSIS — M1711 Unilateral primary osteoarthritis, right knee: Secondary | ICD-10-CM | POA: Diagnosis not present

## 2015-10-12 DIAGNOSIS — Z8719 Personal history of other diseases of the digestive system: Secondary | ICD-10-CM | POA: Diagnosis not present

## 2015-10-12 DIAGNOSIS — Z8739 Personal history of other diseases of the musculoskeletal system and connective tissue: Secondary | ICD-10-CM | POA: Diagnosis not present

## 2015-10-12 DIAGNOSIS — I10 Essential (primary) hypertension: Secondary | ICD-10-CM | POA: Insufficient documentation

## 2015-10-12 DIAGNOSIS — J449 Chronic obstructive pulmonary disease, unspecified: Secondary | ICD-10-CM | POA: Diagnosis not present

## 2015-10-12 DIAGNOSIS — G4733 Obstructive sleep apnea (adult) (pediatric): Secondary | ICD-10-CM | POA: Diagnosis not present

## 2015-10-12 DIAGNOSIS — E876 Hypokalemia: Secondary | ICD-10-CM | POA: Insufficient documentation

## 2015-10-12 DIAGNOSIS — Z880 Allergy status to penicillin: Secondary | ICD-10-CM | POA: Diagnosis not present

## 2015-10-12 DIAGNOSIS — Z86711 Personal history of pulmonary embolism: Secondary | ICD-10-CM | POA: Diagnosis not present

## 2015-10-12 DIAGNOSIS — G8929 Other chronic pain: Secondary | ICD-10-CM | POA: Insufficient documentation

## 2015-10-12 DIAGNOSIS — R55 Syncope and collapse: Secondary | ICD-10-CM | POA: Diagnosis not present

## 2015-10-12 DIAGNOSIS — R404 Transient alteration of awareness: Secondary | ICD-10-CM | POA: Diagnosis not present

## 2015-10-12 DIAGNOSIS — Z9981 Dependence on supplemental oxygen: Secondary | ICD-10-CM | POA: Diagnosis not present

## 2015-10-12 DIAGNOSIS — N390 Urinary tract infection, site not specified: Secondary | ICD-10-CM | POA: Insufficient documentation

## 2015-10-12 DIAGNOSIS — M170 Bilateral primary osteoarthritis of knee: Secondary | ICD-10-CM | POA: Diagnosis not present

## 2015-10-12 LAB — CBG MONITORING, ED
Glucose-Capillary: 64 mg/dL — ABNORMAL LOW (ref 65–99)
Glucose-Capillary: 97 mg/dL (ref 65–99)

## 2015-10-12 LAB — CBC
HCT: 39.4 % (ref 36.0–46.0)
Hemoglobin: 12.8 g/dL (ref 12.0–15.0)
MCH: 29 pg (ref 26.0–34.0)
MCHC: 32.5 g/dL (ref 30.0–36.0)
MCV: 89.3 fL (ref 78.0–100.0)
PLATELETS: 170 10*3/uL (ref 150–400)
RBC: 4.41 MIL/uL (ref 3.87–5.11)
RDW: 12.8 % (ref 11.5–15.5)
WBC: 7.4 10*3/uL (ref 4.0–10.5)

## 2015-10-12 LAB — BASIC METABOLIC PANEL
Anion gap: 8 (ref 5–15)
BUN: 8 mg/dL (ref 6–20)
CALCIUM: 8.8 mg/dL — AB (ref 8.9–10.3)
CO2: 33 mmol/L — ABNORMAL HIGH (ref 22–32)
CREATININE: 1.01 mg/dL — AB (ref 0.44–1.00)
Chloride: 100 mmol/L — ABNORMAL LOW (ref 101–111)
GFR calc Af Amer: >60 mL/min (ref >60)
GFR, EST NON AFRICAN AMERICAN: 55 mL/min — AB (ref >60)
Glucose, Bld: 96 mg/dL (ref 65–99)
Potassium: 2.8 mmol/L — ABNORMAL LOW (ref 3.5–5.1)
SODIUM: 141 mmol/L (ref 135–145)

## 2015-10-12 LAB — URINE MICROSCOPIC-ADD ON

## 2015-10-12 LAB — URINALYSIS, ROUTINE W REFLEX MICROSCOPIC
Bilirubin Urine: NEGATIVE
GLUCOSE, UA: NEGATIVE mg/dL
Hgb urine dipstick: NEGATIVE
KETONES UR: NEGATIVE mg/dL
NITRITE: POSITIVE — AB
PH: 5.5 (ref 5.0–8.0)
Protein, ur: NEGATIVE mg/dL
SPECIFIC GRAVITY, URINE: 1.009 (ref 1.005–1.030)
UROBILINOGEN UA: 0.2 mg/dL (ref 0.0–1.0)

## 2015-10-12 MED ORDER — ONDANSETRON HCL 4 MG/2ML IJ SOLN
4.0000 mg | Freq: Once | INTRAMUSCULAR | Status: AC
Start: 2015-10-12 — End: 2015-10-12
  Administered 2015-10-12: 4 mg via INTRAVENOUS
  Filled 2015-10-12: qty 2

## 2015-10-12 MED ORDER — SODIUM CHLORIDE 0.9 % IV BOLUS (SEPSIS)
500.0000 mL | Freq: Once | INTRAVENOUS | Status: AC
  Administered 2015-10-12: 500 mL via INTRAVENOUS

## 2015-10-12 MED ORDER — POTASSIUM CHLORIDE CRYS ER 20 MEQ PO TBCR
40.0000 meq | EXTENDED_RELEASE_TABLET | Freq: Once | ORAL | Status: AC
  Administered 2015-10-12: 40 meq via ORAL
  Filled 2015-10-12: qty 2

## 2015-10-12 MED ORDER — POTASSIUM CHLORIDE CRYS ER 20 MEQ PO TBCR: 20.0000 meq | EXTENDED_RELEASE_TABLET | Freq: Every day | ORAL | Status: DC

## 2015-10-12 MED ORDER — SODIUM CHLORIDE 0.9 % IV SOLN
INTRAVENOUS | Status: DC
  Administered 2015-10-12: 22:00:00 via INTRAVENOUS

## 2015-10-12 MED ORDER — CEPHALEXIN 250 MG PO CAPS
500.0000 mg | ORAL_CAPSULE | Freq: Once | ORAL | Status: AC
  Administered 2015-10-12: 500 mg via ORAL
  Filled 2015-10-12: qty 2

## 2015-10-12 MED ORDER — CEPHALEXIN 500 MG PO CAPS: 500.0000 mg | ORAL_CAPSULE | Freq: Four times a day (QID) | ORAL | Status: DC

## 2015-10-12 NOTE — ED Provider Notes (Signed)
CSN: [id number]     Arrival date & time 10/12/15  1746 History   First MD Initiated Contact with Patient 10/12/15 1807     Chief Complaint  Patient presents with  . Near Syncope     (Consider location/radiation/quality/duration/timing/severity/associated sxs/prior Treatment) HPI   Linda [name] is a 69 y.o. female who presents for evaluation of near syncope which occurred when she was getting her knees injected, at her orthopedist office. There was no loss of consciousness. She was transferred by EMS for evaluation. She lives alone at an independent retirement home. She denies recent fever, chills, cough, chest pain or abdominal pain. She injured her lower back in a fall 2 days ago. She denies headache or dizziness. She's had urinary odor and dysuria, but no urinary frequency or hematuria. She is taking her usual medications. She had some nausea 2 days ago, but no vomiting. There are no other known modifying factors.   Past Medical History  Diagnosis Date  . HTN (hypertension)   . Depression   . Recurrent UTI     hx of hospitalization for pyelonephritis; started abx prophylaxis 06/2015  . Hay fever   . Mixed incontinence urge and stress   . Diverticular disease   . Insomnia   . Fibromyalgia     Patient states dx was around her late 40s but she had sx's for years prior to this.  . Syncope     Hypotensive; ED visit--Dr. Terrence Dupont did Cath--nonobstructive CAD, EF 55-60%.  In retrospect, suspect pt rx med misuse/polypharmacy  . Idiopathic angio-edema-urticaria 72014    Angioedema component was very minimal  . Asthma     w/ asbestososis   . History of pneumonia     hospitalized 12/2011, 02/2013, and 07/2013 [facility]) for this  . Anginal pain ([place])     Nonobstructive CAD 2014; however, her cardiologist put her on a statin for this and NOT for hyperlipidemia per pt report.  . OSA on CPAP     prior to move to [place]--had another sleep study 10/2015 w/pulm Dr. Camillo Flaming.  . Iron deficiency anemia      Hematologist in [place], [place] did extensive w/u; no cause found; failed oral supplement;; gets fairly regular (q65m or so) IV iron infusions (Venofer  200mg  with procrit.  "for 14 yr I've been getting blood work q month & getting infusions prn" (07/12/2013).  Dr. Marin Olp locally, iron infusions done, EPO deficiency dx'd  . H/O hiatal hernia   . Migraine syndrome     "not as often anymore; used to be ~ q wk" (07/12/2013)  . Tension headache, chronic   . DDD (degenerative disc disease)     lumbar and cervical.   . Osteoarthritis     "severe; progressing fast" (07/12/2013); multiple joints-not surgical candidate for TKR (03/2015)  . Chronic lower back pain   . Anxiety     panic attacks  . Nephrolithiasis     "passed all on my own or they are still in there" (07/12/2013)  . Pyelonephritis     "several times over the last 30 yr" (07/12/2013)  . Diastolic congestive heart failure ([place])   . COPD (chronic obstructive pulmonary disease) ([place])   . Pulmonary embolism ([place]) 07/2013    Dx at [facility] with very small peripheral upper lobe pe 07/2013: pt took coumadin for about 8-9 mo  . Pleural plaque with presence of asbestos 07/22/2013  . BPPV (benign paroxysmal positional vertigo) 12/16/2012  . RBBB (right bundle branch block)   .  Pernicious anemia 08/24/2014  . Acute upper GI bleed 06/2014    while pt taking coumadin, plavix, and meloxicam---despite being told not to take coumadin.   Past Surgical History  Procedure Laterality Date  . Appendectomy  1960  . Total abdominal hysterectomy  1974  . Tendon release  1996    Right forearm and hand  . Knee surgery  2005  . Heel spur surgery Left 2008  . Plantar fascia release Left 2008  . Axillary surgery Left 1978    Multiple "lump" in armpit per pt  . Coccyx removal  1972  . Cardiac catheterization  01/2013    nonobstructive CAD, EF 55-60%  . Transthoracic echocardiogram  01/2013; 04/2014    2014--NORMAL.  2015--focal basal septal hypertrophy, EF 55-60%,  grade I diast dysfxn, mild LAE.    . Dilation and curettage of uterus  ? 1970's  . Eye surgery Left 2012-2013    "injections for ~ 1 yr; don't really know what for" (07/12/2013)  . Spirometry  04/25/14    In hosp for acute asthma/COPD flare: mixed obstructive and restrictive lung disease. The FEV1 is severely reduced at 45% predicted.  FEV1 signif decreased compared to prior spirometry 07/23/13.  [name] Kitchen Esophagogastroduodenoscopy N/A 07/19/2014    Gastritis found + in the setting of supratherapeutic INR, +plavix, + meloxicam.  . Left heart catheterization with coronary angiogram N/A 01/30/2013    Procedure: LEFT HEART CATHETERIZATION WITH CORONARY ANGIOGRAM;  Surgeon: Clent Demark, MD;  Location: [facility] CATH LAB;  Service: Cardiovascular;  Laterality: N/A;  . Cardiovascular stress test  02/22/15    Low risk myocard perf imaging; wall motion normal, normal EF   Family History  Problem Relation Age of Onset  . Arthritis Mother   . Kidney disease Mother   . Heart disease Father   . Stroke Father   . Hypertension Father   . Diabetes Father    Social History  Substance Use Topics  . Smoking status: Never Smoker   . Smokeless tobacco: Never Used     Comment: never used tobacco  . Alcohol Use: No   OB History    No data available     Review of Systems  All other systems reviewed and are negative.     Allergies  Oxycodone and Penicillins  Home Medications   Prior to Admission medications   Medication Sig Start Date End Date Taking? Authorizing Provider  albuterol (PROVENTIL HFA;VENTOLIN HFA) 108 (90 BASE) MCG/ACT inhaler Inhale 2 puffs into the lungs every 6 (six) hours as needed for wheezing or shortness of breath. 09/15/14   Tammi Sou, MD  albuterol (PROVENTIL) (2.5 MG/3ML) 0.083% nebulizer solution Take 3 mLs (2.5 mg total) by nebulization every 6 (six) hours as needed for wheezing or shortness of breath. 04/07/15   Tammi Sou, MD  ALPRAZolam Duanne Moron) 1 MG tablet TAKE 1 TABLET 3  TIMES A DAY. 09/28/15   Tammi Sou, MD  ARIPiprazole (ABILIFY) 5 MG tablet Take 1 tablet (5 mg total) by mouth daily. 09/07/15   Tammi Sou, MD  atorvastatin (LIPITOR) 20 MG tablet Take 1 tablet (20 mg total) by mouth daily at 6 PM. 05/03/15   Tammi Sou, MD  budesonide-formoterol [facility]) 160-4.5 MCG/ACT inhaler Inhale 2 puffs into the lungs 2 (two) times daily. 10/06/14   Irene Pap, NP  Cholecalciferol (VITAMIN D3) 50000 UNITS CAPS Take 50,000 Units by mouth once a week. 12/02/14   Tammi Sou, MD  diclofenac  sodium (VOLTAREN) 1 % GEL Apply 4 g topically 3 (three) times daily. TO BOTH KNEES Patient taking differently: Apply 4 g topically daily as needed (knee pain).  01/16/15   Meredith Staggers, MD  DULoxetine (CYMBALTA) 30 MG capsule 1 cap po qd to be combined with a 60mg  duloxetine cap Patient taking differently: Take 30 mg by mouth daily. to be combined with a 60mg  duloxetine cap 03/20/15   Tammi Sou, MD  DULoxetine (CYMBALTA) 60 MG capsule 1 cap po qd to be combined with a 30 mg duloxetine cap daily Patient taking differently: Take 60 mg by mouth daily. to be combined with a 30 mg duloxetine cap daily 05/03/15   Tammi Sou, MD  fentaNYL (DURAGESIC - DOSED MCG/HR) 12 MCG/HR Place 1 patch (12.5 mcg total) onto the skin every 3 (three) days. 10/06/15   Bayard Hugger, NP  folic acid (FOLVITE) 1 MG tablet Take 2 tablets (2 mg total) by mouth daily. 09/15/14   Tammi Sou, MD  furosemide (LASIX) 20 MG tablet Take 1 tablet (20 mg total) by mouth daily. 08/16/15   Lelon Perla, MD  isosorbide mononitrate (IMDUR) 30 MG 24 hr tablet Take 1 tablet (30 mg total) by mouth daily. 05/03/15   Tammi Sou, MD  lidocaine (LIDODERM) 5 % Place 1 patch onto the skin daily. Remove & Discard patch within 12 hours or as directed by MD 08/11/15   Meredith Staggers, MD  nebivolol (BYSTOLIC) 5 MG tablet Take 1 tablet (5 mg total) by mouth daily. 05/03/15   Tammi Sou,  MD  nitroGLYCERIN (NITROSTAT) 0.4 MG SL tablet Place 0.4 mg under the tongue every 5 (five) minutes as needed for chest pain.    Historical Provider, MD  ondansetron (ZOFRAN) 4 MG tablet Take 1 tablet (4 mg total) by mouth every 6 (six) hours. Patient taking differently: Take 4 mg by mouth every 6 (six) hours as needed for nausea or vomiting.  09/15/14   Hyman Bible, PA-C  oxyCODONE-acetaminophen (PERCOCET) 10-325 MG tablet Take 1 tablet by mouth every 6 (six) hours as needed for pain. ONE MONTH SUPPLY 10/06/15   Bayard Hugger, NP  pantoprazole (PROTONIX) 40 MG tablet Take 1 tablet (40 mg total) by mouth daily. 05/03/15   Tammi Sou, MD  polyethylene glycol powder (GLYCOLAX/MIRALAX) powder Take 17 g by mouth daily. Patient taking differently: Take 17 g by mouth daily as needed for mild constipation or moderate constipation.  09/15/14   Tammi Sou, MD  pregabalin (LYRICA) 300 MG capsule Take 1 capsule twice a day Patient taking differently: Take 300 mg by mouth 2 (two) times daily.  04/27/15   Volanda Napoleon, MD  sulfamethoxazole-trimethoprim (BACTRIM DS,SEPTRA DS) 800-160 MG per tablet Take 1 tablet by mouth daily. 07/26/15   Tammi Sou, MD  traZODone (DESYREL) 50 MG tablet Take 3 tablets (150 mg total) by mouth at bedtime. 06/29/15   Tammi Sou, MD   BP 124/47 mmHg  Pulse 79  Temp(Src) 98 F (36.7 C) (Oral)  Resp 16  Ht 5\' 4"  (1.626 m)  Wt 170 lb (77.111 kg)  BMI 29.17 kg/m2  SpO2 100% Physical Exam  Constitutional: She is oriented to person, place, and time. She appears well-developed.  Elderly, frail  HENT:  Head: Normocephalic and atraumatic.  Right Ear: External ear normal.  Left Ear: External ear normal.  Eyes: Conjunctivae and EOM are normal. Pupils are equal, round, and reactive to  light.  Neck: Normal range of motion and phonation normal. Neck supple.  Cardiovascular: Normal rate, regular rhythm and normal heart sounds.   Pulmonary/Chest: Effort normal  and breath sounds normal. She exhibits no bony tenderness.  Abdominal: Soft. There is no tenderness.  Musculoskeletal: Normal range of motion.  Bilateral knee and lower leg swelling, mild. Indistinct erythema of the medial left lower leg without vesicles, or petechiae. No calf tenderness. Mild bruising, right posterior pelvic area. No associated crepitation or deformity.  Neurological: She is alert and oriented to person, place, and time. No cranial nerve deficit or sensory deficit. She exhibits normal muscle tone. Coordination normal.  Somewhat sleepy, but not lethargic. No dysarthria or aphasia.  Skin: Skin is warm, dry and intact.  Psychiatric: She has a normal mood and affect. Her behavior is normal. Judgment and thought content normal.  Nursing note and vitals reviewed.   ED Course  Procedures (including critical care time) Medications  0.9 %  sodium chloride infusion (not administered)  sodium chloride 0.9 % bolus 500 mL (0 mLs Intravenous Stopped 10/12/15 2101)  ondansetron (ZOFRAN) injection 4 mg (4 mg Intravenous Given 10/12/15 2108)    Patient Vitals for the past 24 hrs:  BP Temp Temp src Pulse Resp SpO2 Height Weight  10/12/15 2030 (!) 124/47 mmHg - - 79 16 100 % - -  10/12/15 2015 133/58 mmHg - - 76 14 100 % - -  10/12/15 2000 151/67 mmHg - - 73 11 99 % - -  10/12/15 1945 148/73 mmHg - - 70 13 99 % - -  10/12/15 1930 152/74 mmHg - - 72 13 100 % - -  10/12/15 1900 134/67 mmHg - - 69 10 99 % - -  10/12/15 1845 144/64 mmHg - - 71 11 98 % - -  10/12/15 1830 (!) 134/47 mmHg - - 71 13 93 % - -  10/12/15 1815 123/58 mmHg - - 70 10 90 % - -  10/12/15 1800 133/83 mmHg - - 69 13 93 % - -  10/12/15 1749 132/79 mmHg 98 F (36.7 C) Oral 74 14 93 % 5\' 4"  (1.626 m) 170 lb (77.111 kg)  10/12/15 1747 - - - - - 100 % - -    10:45 PM Reevaluation with update and discussion. After initial assessment and treatment, an updated evaluation reveals she has been able to walk easily. I discussed  with the patient, all questions answered. Jenesis Martin L    Labs Review Labs Reviewed  BASIC METABOLIC PANEL - Abnormal; Notable for the following:    Potassium 2.8 (*)    Chloride 100 (*)    CO2 33 (*)    Creatinine, Ser 1.01 (*)    Calcium 8.8 (*)    GFR calc non Af Amer 55 (*)    All other components within normal limits  URINALYSIS, ROUTINE W REFLEX MICROSCOPIC (NOT AT [facility]) - Abnormal; Notable for the following:    APPearance CLOUDY (*)    Nitrite POSITIVE (*)    Leukocytes, UA TRACE (*)    All other components within normal limits  URINE MICROSCOPIC-ADD ON - Abnormal; Notable for the following:    Bacteria, UA MANY (*)    All other components within normal limits  CBG MONITORING, ED - Abnormal; Notable for the following:    Glucose-Capillary 64 (*)    All other components within normal limits  URINE CULTURE  CBC  CBG MONITORING, ED    Imaging Review No results found. I  have personally reviewed and evaluated these images and lab results as part of my medical decision-making.   EKG Interpretation   Date/Time:  Thursday October 12 2015 17:47:10 EDT Ventricular Rate:  69 PR Interval:  162 QRS Duration: 167 QT Interval:  471 QTC Calculation: 505 R Axis:   -3 Text Interpretation:  Sinus rhythm Ventricular premature complex Right  bundle branch block Probable left ventricular hypertrophy Prolonged QT  interval since last tracing no significant change Confirmed by Eulis Foster  MD,  Vira Agar ([id number]) on 10/12/2015 7:44:42 PM      MDM   Final diagnoses:  Near syncope  UTI (lower urinary tract infection)  Hypokalemia    Near syncope, without serious injury or ongoing weakness. It was likely associated with the procedure of bilateral knee injection. Incidental hypokalemia, and urinary tract infection, are present. Doubt serious bacterial infection,metabolic instability or impending vascular collapse.  Nursing Notes Reviewed/ Care Coordinated Applicable Imaging  Reviewed Interpretation of Laboratory Data incorporated into ED treatment  The patient appears reasonably screened and/or stabilized for discharge and I doubt any other medical condition or other [facility] requiring further screening, evaluation, or treatment in the ED at this time prior to discharge.  Plan: Home Medications- potassium, Keflex; Home Treatments- rest, fluids; return here if the recommended treatment, does not improve the symptoms; Recommended follow up- PCP 1 week and cardiology in 4 days as scheduled for repeat blood testing.    Daleen Bo, MD 10/12/15 [phone]

## 2015-10-12 NOTE — ED Notes (Signed)
MD at bedside. 

## 2015-10-12 NOTE — ED Notes (Signed)
Linda Foster, MD notified of the pt's low CBG. He would like this RN to feed the pt.

## 2015-10-12 NOTE — ED Notes (Signed)
Pt states that her fentanyl patch needed to be changed 2 days ago. Pt took her fentanyl patch off.

## 2015-10-12 NOTE — ED Notes (Signed)
Gave Pt Ginger ale, per Anna-RN

## 2015-10-12 NOTE — ED Notes (Signed)
Pt was at [facility] receiving injections into bilateral knees when pt had a near syncopal episode.  Pt did not completely loose consciousness.  Pt was assisted to the floor, no fall no trauma.  Pt had orthostatic changes at the facility and EMS reports that pt has had recent adjustments to her Lasix.  Pt is A&Ox4 but lethargic and slow to answer upon arrival.

## 2015-10-12 NOTE — ED Notes (Signed)
Pt given orange juice and graham crackers

## 2015-10-12 NOTE — ED Notes (Signed)
Wentz, MD at bedside.  

## 2015-10-12 NOTE — ED Notes (Signed)
Eulis Foster MD made aware of pt

## 2015-10-12 NOTE — Discharge Instructions (Signed)
Have your blood checked by your cardiologist, next week and ask them if they want to see you for evaluation of your leg swelling.    Hypokalemia Hypokalemia means that the amount of potassium in the blood is lower than normal.Potassium is a chemical, called an electrolyte, that helps regulate the amount of fluid in the body. It also stimulates muscle contraction and helps nerves function properly.Most of the body's potassium is inside of cells, and only a very small amount is in the blood. Because the amount in the blood is so small, minor changes can be life-threatening. CAUSES  Antibiotics.  Diarrhea or vomiting.  Using laxatives too much, which can cause diarrhea.  Chronic kidney disease.  Water pills (diuretics).  Eating disorders (bulimia).  Low magnesium level.  Sweating a lot. SIGNS AND SYMPTOMS  Weakness.  Constipation.  Fatigue.  Muscle cramps.  Mental confusion.  Skipped heartbeats or irregular heartbeat (palpitations).  Tingling or numbness. DIAGNOSIS  Your health care provider can diagnose hypokalemia with blood tests. In addition to checking your potassium level, your health care provider may also check other lab tests. TREATMENT Hypokalemia can be treated with potassium supplements taken by mouth or adjustments in your current medicines. If your potassium level is very low, you may need to get potassium through a vein (IV) and be monitored in the hospital. A diet high in potassium is also helpful. Foods high in potassium are:  Nuts, such as peanuts and pistachios.  Seeds, such as sunflower seeds and pumpkin seeds.  Peas, lentils, and lima beans.  Whole grain and bran cereals and breads.  Fresh fruit and vegetables, such as apricots, avocado, bananas, cantaloupe, kiwi, oranges, tomatoes, asparagus, and potatoes.  Orange and tomato juices.  Red meats.  Fruit yogurt. HOME CARE INSTRUCTIONS  Take all medicines as prescribed by your health care  provider.  Maintain a healthy diet by including nutritious food, such as fruits, vegetables, nuts, whole grains, and lean meats.  If you are taking a laxative, be sure to follow the directions on the label. SEEK MEDICAL CARE IF:  Your weakness gets worse.  You feel your heart pounding or racing.  You are vomiting or having diarrhea.  You are diabetic and having trouble keeping your blood glucose in the normal range. SEEK IMMEDIATE MEDICAL CARE IF:  You have chest pain, shortness of breath, or dizziness.  You are vomiting or having diarrhea for more than 2 days.  You faint. MAKE SURE YOU:   Understand these instructions.  Will watch your condition.  Will get help right away if you are not doing well or get worse.   This information is not intended to replace advice given to you by your health care provider. Make sure you discuss any questions you have with your health care provider.   Document Released: 12/09/2005 Document Revised: 12/30/2014 Document Reviewed: 06/11/2013 Elsevier Interactive Patient Education 2016 [place].  Potassium Content of Foods Potassium is a mineral found in many foods and drinks. It helps keep fluids and minerals balanced in your body and affects how steadily your heart beats. Potassium also helps control your blood pressure and keep your muscles and nervous system healthy. Certain health conditions and medicines may change the balance of potassium in your body. When this happens, you can help balance your level of potassium through the foods that you do or do not eat. Your health care provider or dietitian may recommend an amount of potassium that you should have each day. The following  lists of foods provide the amount of potassium (in parentheses) per serving in each item. HIGH IN POTASSIUM  The following foods and beverages have 200 mg or more of potassium per serving:  Apricots, 2 raw or 5 dry (200 mg).  Artichoke, 1 medium (345  mg).  Avocado, raw,  each (245 mg).  Banana, 1 medium (425 mg).  Beans, lima, or baked beans, canned,  cup (280 mg).  Beans, white, canned,  cup (595 mg).  Beef roast, 3 oz (320 mg).  Beef, ground, 3 oz (270 mg).  Beets, raw or cooked,  cup (260 mg).  Bran muffin, 2 oz (300 mg).  Broccoli,  cup (230 mg).  Brussels sprouts,  cup (250 mg).  Cantaloupe,  cup (215 mg).  Cereal, 100% bran,  cup (200-400 mg).  Cheeseburger, single, fast food, 1 each (225-400 mg).  Chicken, 3 oz (220 mg).  Clams, canned, 3 oz (535 mg).  Crab, 3 oz (225 mg).  Dates, 5 each (270 mg).  Dried beans and peas,  cup (300-475 mg).  Figs, dried, 2 each (260 mg).  Fish: halibut, tuna, cod, snapper, 3 oz (480 mg).  Fish: salmon, haddock, swordfish, perch, 3 oz (300 mg).  Fish, tuna, canned 3 oz (200 mg).  [place] fries, fast food, 3 oz (470 mg).  Granola with fruit and nuts,  cup (200 mg).  Grapefruit juice,  cup (200 mg).  Greens, beet,  cup (655 mg).  Honeydew melon,  cup (200 mg).  Kale, raw, 1 cup (300 mg).  Kiwi, 1 medium (240 mg).  Kohlrabi, rutabaga, parsnips,  cup (280 mg).  Lentils,  cup (365 mg).  Mango, 1 each (325 mg).  Milk, chocolate, 1 cup (420 mg).  Milk: nonfat, low-fat, whole, buttermilk, 1 cup (350-380 mg).  Molasses, 1 Tbsp (295 mg).  Mushrooms,  cup (280) mg.  Nectarine, 1 each (275 mg).  Nuts: almonds, peanuts, hazelnuts, [place], cashew, mixed, 1 oz (200 mg).  Nuts, pistachios, 1 oz (295 mg).  Orange, 1 each (240 mg).  Orange juice,  cup (235 mg).  Papaya, medium,  fruit (390 mg).  Peanut butter, chunky, 2 Tbsp (240 mg).  Peanut butter, smooth, 2 Tbsp (210 mg).  Pear, 1 medium (200 mg).  Pomegranate, 1 whole (400 mg).  Pomegranate juice,  cup (215 mg).  Pork, 3 oz (350 mg).  Potato chips, salted, 1 oz (465 mg).  Potato, baked with skin, 1 medium (925 mg).  Potatoes, boiled,  cup (255 mg).  Potatoes, mashed,   cup (330 mg).  Prune juice,  cup (370 mg).  Prunes, 5 each (305 mg).  Pudding, chocolate,  cup (230 mg).  Pumpkin, canned,  cup (250 mg).  Raisins, seedless,  cup (270 mg).  Seeds, sunflower or pumpkin, 1 oz (240 mg).  Soy milk, 1 cup (300 mg).  Spinach,  cup (420 mg).  Spinach, canned,  cup (370 mg).  Sweet potato, baked with skin, 1 medium (450 mg).  Swiss chard,  cup (480 mg).  Tomato or vegetable juice,  cup (275 mg).  Tomato sauce or puree,  cup (400-550 mg).  Tomato, raw, 1 medium (290 mg).  Tomatoes, canned,  cup (200-300 mg).  [place], 3 oz (250 mg).  Wheat germ, 1 oz (250 mg).  Winter squash,  cup (250 mg).  Yogurt, plain or fruited, 6 oz (260-435 mg).  Zucchini,  cup (220 mg). MODERATE IN POTASSIUM The following foods and beverages have 50-200 mg of potassium per serving:  Apple,  1 each (150 mg).  Apple juice,  cup (150 mg).  Applesauce,  cup (90 mg).  Apricot nectar,  cup (140 mg).  Asparagus, small spears,  cup or 6 spears (155 mg).  Bagel, cinnamon raisin, 1 each (130 mg).  Bagel, egg or plain, 4 in., 1 each (70 mg).  Beans, green,  cup (90 mg).  Beans, yellow,  cup (190 mg).  Beer, regular, 12 oz (100 mg).  Beets, canned,  cup (125 mg).  Blackberries,  cup (115 mg).  Blueberries,  cup (60 mg).  Bread, whole wheat, 1 slice (70 mg).  Broccoli, raw,  cup (145 mg).  Cabbage,  cup (150 mg).  Carrots, cooked or raw,  cup (180 mg).  Cauliflower, raw,  cup (150 mg).  Celery, raw,  cup (155 mg).  Cereal, bran flakes, cup (120-150 mg).  Cheese, cottage,  cup (110 mg).  Cherries, 10 each (150 mg).  Chocolate, 1 oz bar (165 mg).  Coffee, brewed 6 oz (90 mg).  Corn,  cup or 1 ear (195 mg).  Cucumbers,  cup (80 mg).  Egg, large, 1 each (60 mg).  Eggplant,  cup (60 mg).  Endive, raw, cup (80 mg).  English muffin, 1 each (65 mg).  Fish, orange roughy, 3 oz (150 mg).  Frankfurter,  beef or pork, 1 each (75 mg).  Fruit cocktail,  cup (115 mg).  Grape juice,  cup (170 mg).  Grapefruit,  fruit (175 mg).  Grapes,  cup (155 mg).  Greens: kale, turnip, collard,  cup (110-150 mg).  Ice cream or frozen yogurt, chocolate,  cup (175 mg).  Ice cream or frozen yogurt, vanilla,  cup (120-150 mg).  Lemons, limes, 1 each (80 mg).  Lettuce, all types, 1 cup (100 mg).  Mixed vegetables,  cup (150 mg).  Mushrooms, raw,  cup (110 mg).  Nuts: walnuts, pecans, or macadamia, 1 oz (125 mg).  Oatmeal,  cup (80 mg).  Okra,  cup (110 mg).  Onions, raw,  cup (120 mg).  Peach, 1 each (185 mg).  Peaches, canned,  cup (120 mg).  Pears, canned,  cup (120 mg).  Peas, green, frozen,  cup (90 mg).  Peppers, green,  cup (130 mg).  Peppers, red,  cup (160 mg).  Pineapple juice,  cup (165 mg).  Pineapple, fresh or canned,  cup (100 mg).  Plums, 1 each (105 mg).  Pudding, vanilla,  cup (150 mg).  Raspberries,  cup (90 mg).  Rhubarb,  cup (115 mg).  Rice, wild,  cup (80 mg).  Shrimp, 3 oz (155 mg).  Spinach, raw, 1 cup (170 mg).  Strawberries,  cup (125 mg).  Summer squash  cup (175-200 mg).  Swiss chard, raw, 1 cup (135 mg).  Tangerines, 1 each (140 mg).  Tea, brewed, 6 oz (65 mg).  Turnips,  cup (140 mg).  Watermelon,  cup (85 mg).  Wine, red, table, 5 oz (180 mg).  Wine, white, table, 5 oz (100 mg). LOW IN POTASSIUM The following foods and beverages have less than 50 mg of potassium per serving.  Bread, white, 1 slice (30 mg).  Carbonated beverages, 12 oz (less than 5 mg).  Cheese, 1 oz (20-30 mg).  Cranberries,  cup (45 mg).  Cranberry juice cocktail,  cup (20 mg).  Fats and oils, 1 Tbsp (less than 5 mg).  Hummus, 1 Tbsp (32 mg).  Nectar: papaya, mango, or pear,  cup (35 mg).  Rice, white or brown,  cup (50 mg).  Spaghetti or macaroni,  cup cooked (30 mg).  Tortilla, flour or corn, 1 each (50  mg).  Waffle, 4 in., 1 each (50 mg).  Water chestnuts,  cup (40 mg).   This information is not intended to replace advice given to you by your health care provider. Make sure you discuss any questions you have with your health care provider.   Document Released: 07/23/2005 Document Revised: 12/14/2013 Document Reviewed: 11/05/2013 Elsevier Interactive Patient Education 2016 [employer]  Urinary Tract Infection Urinary tract infections (UTIs) can develop anywhere along your urinary tract. Your urinary tract is your body's drainage system for removing wastes and extra water. Your urinary tract includes two kidneys, two ureters, a bladder, and a urethra. Your kidneys are a pair of bean-shaped organs. Each kidney is about the size of your fist. They are located below your ribs, one on each side of your spine. CAUSES Infections are caused by microbes, which are microscopic organisms, including fungi, viruses, and bacteria. These organisms are so small that they can only be seen through a microscope. Bacteria are the microbes that most commonly cause UTIs. SYMPTOMS  Symptoms of UTIs may vary by age and gender of the patient and by the location of the infection. Symptoms in young women typically include a frequent and intense urge to urinate and a painful, burning feeling in the bladder or urethra during urination. Older women and men are more likely to be tired, shaky, and weak and have muscle aches and abdominal pain. A fever may mean the infection is in your kidneys. Other symptoms of a kidney infection include pain in your back or sides below the ribs, nausea, and vomiting. DIAGNOSIS To diagnose a UTI, your caregiver will ask you about your symptoms. Your caregiver will also ask you to provide a urine sample. The urine sample will be tested for bacteria and white blood cells. White blood cells are made by your body to help fight infection. TREATMENT  Typically, UTIs can be treated with  medication. Because most UTIs are caused by a bacterial infection, they usually can be treated with the use of antibiotics. The choice of antibiotic and length of treatment depend on your symptoms and the type of bacteria causing your infection. HOME CARE INSTRUCTIONS  If you were prescribed antibiotics, take them exactly as your caregiver instructs you. Finish the medication even if you feel better after you have only taken some of the medication.  Drink enough water and fluids to keep your urine clear or pale yellow.  Avoid caffeine, tea, and carbonated beverages. They tend to irritate your bladder.  Empty your bladder often. Avoid holding urine for long periods of time.  Empty your bladder before and after sexual intercourse.  After a bowel movement, women should cleanse from front to back. Use each tissue only once. SEEK MEDICAL CARE IF:   You have back pain.  You develop a fever.  Your symptoms do not begin to resolve within 3 days. SEEK IMMEDIATE MEDICAL CARE IF:   You have severe back pain or lower abdominal pain.  You develop chills.  You have nausea or vomiting.  You have continued burning or discomfort with urination. MAKE SURE YOU:   Understand these instructions.  Will watch your condition.  Will get help right away if you are not doing well or get worse.   This information is not intended to replace advice given to you by your health care provider. Make sure you discuss any questions you have with  your health care provider.   Document Released: 09/18/2005 Document Revised: 08/30/2015 Document Reviewed: 01/17/2012 Elsevier Interactive Patient Education [employer].

## 2015-10-12 NOTE — ED Notes (Signed)
Ambulated PT in hallway.

## 2015-10-13 ENCOUNTER — Other Ambulatory Visit: Payer: Self-pay

## 2015-10-13 NOTE — Telephone Encounter (Signed)
  Spoke w/ daughter, spoke w/ patient - both state swelling hasn't gone down since being instructed to double lasix 3 days ago by pulmonologist.  Pt sent from primary care to ED yesterday - low BP, low potassium, low BS. She was ultimately sent home w/ course of antibiotics and potassium supplement w/ instruction to take daily and f/u w cardiology.  i spoke to patient, and talked to [place] at [facility] pt onto flex schedule for Monday to see Truitt Merle.   Called back pt to verify appt time/loc/date/provider. Informed her I would get advice from provider here regarding what to do about swelling in interim prior to Monday, as my concern is that the potassium does need correction if we are increasing lasix.  Spoke w/ Fredia Beets after I got off phone-- she informed me she also spoke to pt/daughter today, and received instruction from [place], she will note and communicate his recommendations.

## 2015-10-13 NOTE — Telephone Encounter (Signed)
Pt's daughter called in stating that this is the 3rd day that she was put on a regime to double her Lasix and that has not helped her condition. She went to the ER yesterday for an elevated BP and blood sugar but she was not admitted. Please f/u  Thanks

## 2015-10-13 NOTE — Telephone Encounter (Signed)
Potassium level discussed with luke kilroy pa-c, pt advised to take 60 meq of potassium now. She will then take one tablet daily. She will keep her legs elevated. Patient voiced understanding of appt location and time on Monday.

## 2015-10-14 LAB — OXYCODONE, URINE (LC/MS-MS)
Noroxycodone, Ur: >10000 ng/mL (ref <50)
OXYCODONE, UR: 7947 ng/mL (ref <50)
OXYMORPHONE, URINE: 2732 ng/mL (ref <50)

## 2015-10-14 LAB — OPIATES/OPIOIDS (LC/MS-MS)
CODEINE URINE: NEGATIVE ng/mL (ref <50)
HYDROCODONE: NEGATIVE ng/mL (ref <50)
HYDROMORPHONE: NEGATIVE ng/mL (ref <50)
MORPHINE: NEGATIVE ng/mL (ref <50)
NORHYDROCODONE, UR: NEGATIVE ng/mL (ref <50)
Noroxycodone, Ur: >10000 ng/mL (ref <50)
Oxycodone, ur: 7947 ng/mL (ref <50)
Oxymorphone: 2732 ng/mL (ref <50)

## 2015-10-14 LAB — BENZODIAZEPINES (GC/LC/MS), URINE
Alprazolam metabolite (GC/LC/MS), ur confirm: 2455 ng/mL — AB (ref <25)
Clonazepam metabolite (GC/LC/MS), ur confirm: NEGATIVE ng/mL (ref <25)
Flurazepam metabolite (GC/LC/MS), ur confirm: NEGATIVE ng/mL (ref <50)
Lorazepam (GC/LC/MS), ur confirm: NEGATIVE ng/mL (ref <50)
MIDAZOLAMU: NEGATIVE ng/mL (ref <50)
Nordiazepam (GC/LC/MS), ur confirm: NEGATIVE ng/mL (ref <50)
Oxazepam (GC/LC/MS), ur confirm: NEGATIVE ng/mL (ref <50)
TRIAZOLAMU: NEGATIVE ng/mL (ref <50)
Temazepam (GC/LC/MS), ur confirm: NEGATIVE ng/mL (ref <50)

## 2015-10-14 LAB — AMPHETAMINES (GC/LC/MS), URINE
Amphetamine GC/MS Conf: NEGATIVE ng/mL (ref <250)
Methamphetamine Quant, Ur: NEGATIVE ng/mL (ref <250)

## 2015-10-14 LAB — FENTANYL (GC/LC/MS), URINE: FENTANYL (GC/MS) CONFIRM: 9.3 ng/mL (ref <0.5)

## 2015-10-15 LAB — URINE CULTURE: Special Requests: NORMAL

## 2015-10-16 ENCOUNTER — Observation Stay (HOSPITAL_COMMUNITY)
Admission: AD | Admit: 2015-10-16 | Discharge: 2015-10-20 | Disposition: A | Discharge status: Home / Self Care | Payer: Medicare Other | Source: Ambulatory Visit | Attending: Cardiology | Admitting: Cardiology

## 2015-10-16 ENCOUNTER — Telehealth (HOSPITAL_BASED_OUTPATIENT_CLINIC_OR_DEPARTMENT_OTHER): Payer: Self-pay | Admitting: Emergency Medicine

## 2015-10-16 ENCOUNTER — Ambulatory Visit (INDEPENDENT_AMBULATORY_CARE_PROVIDER_SITE_OTHER): Payer: Medicare Other | Admitting: Nurse Practitioner

## 2015-10-16 ENCOUNTER — Other Ambulatory Visit: Payer: Self-pay | Admitting: Nurse Practitioner

## 2015-10-16 ENCOUNTER — Encounter: Payer: Self-pay | Admitting: Nurse Practitioner

## 2015-10-16 VITALS — BP 120/60 | HR 94 | Ht 64.0 in | Wt 173.2 lb

## 2015-10-16 DIAGNOSIS — R404 Transient alteration of awareness: Secondary | ICD-10-CM | POA: Diagnosis not present

## 2015-10-16 DIAGNOSIS — I5032 Chronic diastolic (congestive) heart failure: Secondary | ICD-10-CM

## 2015-10-16 DIAGNOSIS — F411 Generalized anxiety disorder: Secondary | ICD-10-CM | POA: Diagnosis present

## 2015-10-16 DIAGNOSIS — I2510 Atherosclerotic heart disease of native coronary artery without angina pectoris: Secondary | ICD-10-CM | POA: Diagnosis not present

## 2015-10-16 DIAGNOSIS — R0602 Shortness of breath: Secondary | ICD-10-CM | POA: Diagnosis present

## 2015-10-16 DIAGNOSIS — Z86711 Personal history of pulmonary embolism: Secondary | ICD-10-CM | POA: Diagnosis present

## 2015-10-16 DIAGNOSIS — I5030 Unspecified diastolic (congestive) heart failure: Secondary | ICD-10-CM | POA: Diagnosis present

## 2015-10-16 DIAGNOSIS — R0600 Dyspnea, unspecified: Secondary | ICD-10-CM | POA: Diagnosis not present

## 2015-10-16 DIAGNOSIS — N179 Acute kidney failure, unspecified: Secondary | ICD-10-CM | POA: Insufficient documentation

## 2015-10-16 DIAGNOSIS — I5033 Acute on chronic diastolic (congestive) heart failure: Secondary | ICD-10-CM | POA: Diagnosis not present

## 2015-10-16 DIAGNOSIS — G894 Chronic pain syndrome: Secondary | ICD-10-CM | POA: Diagnosis present

## 2015-10-16 DIAGNOSIS — E785 Hyperlipidemia, unspecified: Secondary | ICD-10-CM | POA: Diagnosis present

## 2015-10-16 DIAGNOSIS — R4182 Altered mental status, unspecified: Secondary | ICD-10-CM | POA: Diagnosis not present

## 2015-10-16 DIAGNOSIS — Z880 Allergy status to penicillin: Secondary | ICD-10-CM | POA: Diagnosis not present

## 2015-10-16 DIAGNOSIS — Z885 Allergy status to narcotic agent status: Secondary | ICD-10-CM | POA: Insufficient documentation

## 2015-10-16 DIAGNOSIS — N390 Urinary tract infection, site not specified: Secondary | ICD-10-CM | POA: Diagnosis not present

## 2015-10-16 DIAGNOSIS — R531 Weakness: Secondary | ICD-10-CM | POA: Diagnosis not present

## 2015-10-16 LAB — COMPREHENSIVE METABOLIC PANEL
ALT: 15 U/L (ref 14–54)
AST: 26 U/L (ref 15–41)
Albumin: 3.8 g/dL (ref 3.5–5.0)
Alkaline Phosphatase: 84 U/L (ref 38–126)
Anion gap: 10 (ref 5–15)
BUN: 10 mg/dL (ref 6–20)
CO2: 32 mmol/L (ref 22–32)
Calcium: 9.6 mg/dL (ref 8.9–10.3)
Chloride: 100 mmol/L — ABNORMAL LOW (ref 101–111)
Creatinine, Ser: 0.93 mg/dL (ref 0.44–1.00)
GFR calc Af Amer: >60 mL/min (ref >60)
GFR calc non Af Amer: >60 mL/min (ref >60)
Glucose, Bld: 104 mg/dL — ABNORMAL HIGH (ref 65–99)
Potassium: 3.3 mmol/L — ABNORMAL LOW (ref 3.5–5.1)
Sodium: 142 mmol/L (ref 135–145)
Total Bilirubin: 0.9 mg/dL (ref 0.3–1.2)
Total Protein: 7.1 g/dL (ref 6.5–8.1)

## 2015-10-16 LAB — CBC WITH DIFFERENTIAL/PLATELET
Basophils Absolute: 0 10*3/uL (ref 0.0–0.1)
Basophils Relative: 0 %
Eosinophils Absolute: 0.1 10*3/uL (ref 0.0–0.7)
Eosinophils Relative: 1 %
HCT: 38.5 % (ref 36.0–46.0)
Hemoglobin: 12.9 g/dL (ref 12.0–15.0)
Lymphocytes Relative: 28 %
Lymphs Abs: 2.2 10*3/uL (ref 0.7–4.0)
MCH: 29.6 pg (ref 26.0–34.0)
MCHC: 33.5 g/dL (ref 30.0–36.0)
MCV: 88.3 fL (ref 78.0–100.0)
Monocytes Absolute: 0.6 10*3/uL (ref 0.1–1.0)
Monocytes Relative: 7 %
Neutro Abs: 5.1 10*3/uL (ref 1.7–7.7)
Neutrophils Relative %: 64 %
Platelets: 200 10*3/uL (ref 150–400)
RBC: 4.36 MIL/uL (ref 3.87–5.11)
RDW: 12.9 % (ref 11.5–15.5)
WBC: 8 10*3/uL (ref 4.0–10.5)

## 2015-10-16 LAB — URINALYSIS, ROUTINE W REFLEX MICROSCOPIC
Bilirubin Urine: NEGATIVE
Glucose, UA: NEGATIVE mg/dL
Hgb urine dipstick: NEGATIVE
Ketones, ur: NEGATIVE mg/dL
Nitrite: NEGATIVE
Protein, ur: NEGATIVE mg/dL
Specific Gravity, Urine: 1.006 (ref 1.005–1.030)
Urobilinogen, UA: 1 mg/dL (ref 0.0–1.0)
pH: 7 (ref 5.0–8.0)

## 2015-10-16 LAB — APTT: aPTT: 32 seconds (ref 24–37)

## 2015-10-16 LAB — D-DIMER, QUANTITATIVE: D-Dimer, Quant: 2.68 ug/mL-FEU — ABNORMAL HIGH (ref 0.00–0.48)

## 2015-10-16 LAB — URINE MICROSCOPIC-ADD ON

## 2015-10-16 LAB — PROTIME-INR
INR: 1.05 (ref 0.00–1.49)
Prothrombin Time: 13.9 seconds (ref 11.6–15.2)

## 2015-10-16 LAB — BRAIN NATRIURETIC PEPTIDE: B Natriuretic Peptide: 15.4 pg/mL (ref 0.0–100.0)

## 2015-10-16 LAB — TROPONIN I: Troponin I: <0.03 ng/mL (ref <0.031)

## 2015-10-16 MED ORDER — SODIUM CHLORIDE 0.9 % IJ SOLN: 3.0000 mL | INTRAMUSCULAR | Status: DC | PRN

## 2015-10-16 MED ORDER — DULOXETINE HCL 60 MG PO CPEP: 60.0000 mg | ORAL_CAPSULE | Freq: Every day | ORAL | Status: DC

## 2015-10-16 MED ORDER — FUROSEMIDE 10 MG/ML IJ SOLN
40.0000 mg | Freq: Two times a day (BID) | INTRAMUSCULAR | Status: DC
  Administered 2015-10-16 – 2015-10-18 (×4): 40 mg via INTRAVENOUS
  Filled 2015-10-16 (×4): qty 4

## 2015-10-16 MED ORDER — BUDESONIDE-FORMOTEROL FUMARATE 160-4.5 MCG/ACT IN AERO
2.0000 | INHALATION_SPRAY | Freq: Two times a day (BID) | RESPIRATORY_TRACT | Status: DC
  Administered 2015-10-16 – 2015-10-20 (×8): 2 via RESPIRATORY_TRACT
  Filled 2015-10-16: qty 6

## 2015-10-16 MED ORDER — ONDANSETRON HCL 4 MG/2ML IJ SOLN
4.0000 mg | Freq: Four times a day (QID) | INTRAMUSCULAR | Status: DC | PRN
  Administered 2015-10-16 – 2015-10-17 (×3): 4 mg via INTRAVENOUS
  Filled 2015-10-16 (×3): qty 2

## 2015-10-16 MED ORDER — FOLIC ACID 1 MG PO TABS
1.0000 mg | ORAL_TABLET | Freq: Every day | ORAL | Status: DC
Start: 2015-10-17 — End: 2015-10-20
  Administered 2015-10-17 – 2015-10-20 (×4): 1 mg via ORAL
  Filled 2015-10-16 (×5): qty 1

## 2015-10-16 MED ORDER — ISOSORBIDE MONONITRATE ER 30 MG PO TB24
30.0000 mg | ORAL_TABLET | Freq: Every day | ORAL | Status: DC
  Administered 2015-10-17 – 2015-10-20 (×4): 30 mg via ORAL
  Filled 2015-10-16 (×5): qty 1

## 2015-10-16 MED ORDER — PREGABALIN 100 MG PO CAPS
300.0000 mg | ORAL_CAPSULE | Freq: Two times a day (BID) | ORAL | Status: DC
  Administered 2015-10-16 – 2015-10-20 (×8): 300 mg via ORAL
  Filled 2015-10-16 (×8): qty 3

## 2015-10-16 MED ORDER — TRAZODONE HCL 150 MG PO TABS
150.0000 mg | ORAL_TABLET | Freq: Every evening | ORAL | Status: DC | PRN
  Administered 2015-10-16 – 2015-10-19 (×4): 150 mg via ORAL
  Filled 2015-10-16 (×4): qty 1

## 2015-10-16 MED ORDER — ALBUTEROL SULFATE (2.5 MG/3ML) 0.083% IN NEBU: 2.5000 mg | INHALATION_SOLUTION | Freq: Four times a day (QID) | RESPIRATORY_TRACT | Status: DC | PRN

## 2015-10-16 MED ORDER — ARIPIPRAZOLE 5 MG PO TABS
5.0000 mg | ORAL_TABLET | Freq: Every day | ORAL | Status: DC
  Administered 2015-10-17 – 2015-10-20 (×4): 5 mg via ORAL
  Filled 2015-10-16 (×4): qty 1

## 2015-10-16 MED ORDER — SULFAMETHOXAZOLE-TRIMETHOPRIM 800-160 MG PO TABS
1.0000 | ORAL_TABLET | Freq: Two times a day (BID) | ORAL | Status: DC
  Administered 2015-10-16 – 2015-10-17 (×3): 1 via ORAL
  Filled 2015-10-16 (×4): qty 1

## 2015-10-16 MED ORDER — ATORVASTATIN CALCIUM 20 MG PO TABS
20.0000 mg | ORAL_TABLET | Freq: Every day | ORAL | Status: DC
  Administered 2015-10-16 – 2015-10-19 (×4): 20 mg via ORAL
  Filled 2015-10-16 (×4): qty 1

## 2015-10-16 MED ORDER — SODIUM CHLORIDE 0.9 % IJ SOLN
3.0000 mL | Freq: Two times a day (BID) | INTRAMUSCULAR | Status: DC
  Administered 2015-10-16 – 2015-10-20 (×9): 3 mL via INTRAVENOUS

## 2015-10-16 MED ORDER — NEBIVOLOL HCL 5 MG PO TABS
5.0000 mg | ORAL_TABLET | Freq: Every day | ORAL | Status: DC
  Administered 2015-10-17 – 2015-10-20 (×4): 5 mg via ORAL
  Filled 2015-10-16 (×4): qty 1

## 2015-10-16 MED ORDER — POTASSIUM CHLORIDE CRYS ER 20 MEQ PO TBCR
40.0000 meq | EXTENDED_RELEASE_TABLET | Freq: Two times a day (BID) | ORAL | Status: DC
  Administered 2015-10-16 – 2015-10-19 (×7): 40 meq via ORAL
  Filled 2015-10-16 (×7): qty 2

## 2015-10-16 MED ORDER — ACETAMINOPHEN 325 MG PO TABS
650.0000 mg | ORAL_TABLET | ORAL | Status: DC | PRN
  Administered 2015-10-17: 650 mg via ORAL
  Filled 2015-10-16: qty 2

## 2015-10-16 MED ORDER — PANTOPRAZOLE SODIUM 40 MG PO TBEC
40.0000 mg | DELAYED_RELEASE_TABLET | Freq: Every day | ORAL | Status: DC
  Administered 2015-10-17 – 2015-10-20 (×4): 40 mg via ORAL
  Filled 2015-10-16 (×6): qty 1

## 2015-10-16 MED ORDER — LISINOPRIL 2.5 MG PO TABS
2.5000 mg | ORAL_TABLET | Freq: Every day | ORAL | Status: DC
  Administered 2015-10-16 – 2015-10-17 (×2): 2.5 mg via ORAL
  Filled 2015-10-16 (×3): qty 1

## 2015-10-16 MED ORDER — DULOXETINE HCL 60 MG PO CPEP
60.0000 mg | ORAL_CAPSULE | Freq: Every day | ORAL | Status: DC
  Administered 2015-10-16 – 2015-10-17 (×2): 60 mg via ORAL
  Filled 2015-10-16 (×3): qty 1

## 2015-10-16 MED ORDER — SODIUM CHLORIDE 0.9 % IV SOLN: 250.0000 mL | INTRAVENOUS | Status: DC | PRN

## 2015-10-16 MED ORDER — NALOXONE HCL 0.4 MG/ML IJ SOLN: 0.4000 mg | Freq: Once | INTRAMUSCULAR | Status: DC

## 2015-10-16 MED ORDER — DULOXETINE HCL 30 MG PO CPEP: 30.0000 mg | ORAL_CAPSULE | Freq: Every day | ORAL | Status: DC

## 2015-10-16 MED ORDER — HEPARIN SODIUM (PORCINE) 5000 UNIT/ML IJ SOLN
5000.0000 [IU] | Freq: Three times a day (TID) | INTRAMUSCULAR | Status: DC
  Administered 2015-10-16 – 2015-10-20 (×12): 5000 [IU] via SUBCUTANEOUS
  Filled 2015-10-16 (×13): qty 1

## 2015-10-16 MED ORDER — DULOXETINE HCL 30 MG PO CPEP
30.0000 mg | ORAL_CAPSULE | Freq: Every day | ORAL | Status: DC
  Administered 2015-10-16 – 2015-10-17 (×2): 30 mg via ORAL
  Filled 2015-10-16 (×3): qty 1

## 2015-10-16 MED ORDER — ASPIRIN EC 81 MG PO TBEC
81.0000 mg | DELAYED_RELEASE_TABLET | Freq: Every day | ORAL | Status: DC
  Administered 2015-10-16 – 2015-10-20 (×5): 81 mg via ORAL
  Filled 2015-10-16 (×5): qty 1

## 2015-10-16 MED ORDER — DICLOFENAC SODIUM 1 % TD GEL
4.0000 g | Freq: Three times a day (TID) | TRANSDERMAL | Status: DC
  Administered 2015-10-16 – 2015-10-20 (×12): 4 g via TOPICAL
  Filled 2015-10-16: qty 100

## 2015-10-16 NOTE — Patient Instructions (Addendum)
We are admitting you to the hospital today. 

## 2015-10-16 NOTE — Telephone Encounter (Signed)
Post ED Visit - Positive Culture Follow-up  Culture report reviewed by antimicrobial stewardship pharmacist:  []  Heide Guile, Pharm.D., BCPS [x]  Alycia Rossetti, Pharm.D., BCPS []  [place], Pharm.D., BCPS, AAHIVP []  Legrand Como, Pharm.D., BCPS, AAHIVP []  [place], Pharm.D. []  Milus Glazier, Pharm.D.   Positive urine culture E.coli Treated with cephalexin, organism sensitive to the same and no further patient follow-up is required at this time.  Hazle Nordmann 10/16/2015, 9:12 AM

## 2015-10-16 NOTE — Progress Notes (Signed)
CARDIOLOGY OFFICE NOTE  Date:  10/16/2015    [name] Date of Birth: 1946/10/22 Medical Record #[id number]  PCP:  Tammi Sou, MD  Cardiologist:  Stanford Breed    Chief Complaint  Patient presents with  . Shortness of Breath    Work in visit - seen for Dr. Stanford Breed    History of Present Illness: Linda [name] is a 69 y.o. female who presents today for a work in visit. Seen for Dr. Stanford Breed. She has diastolic congestive heart failure. Cardiac catheterization February 2014 showed nonobstructive disease (most significant 50-60 RCA) and normal LV function; note of 4+ MR. Renal Dopplers May 2014 showed no stenosis. Echocardiogram May 2015 showed normal LV function, grade 1 diastolic dysfunction, mild left atrial enlargement and mild mitral regurgitation. Nuclear study March 2016 showed ejection fraction 96% and no ischemia or infarction. Also with history of presumed orthostatic syncope. Previously followed by Dr Terrence Dupont.  Her other problems are as noted in the past medical history.  Last seen by Dr. Stanford Breed back in August - seemed to have stable symptoms.   In the ER last week with a near syncopal spell after having a cortisone injection.   Comes in today. Here with her son. Her oxygen tank is empty. They were late for the appointment because of the difficulty in getting her here. His name is Gershon Mussel - he gives some of the history - he is upset that she was sent home Saturday. Very weak. Very fatigued. Progressive over the last few weeks. Can't do "anything". More swelling. Probably still with a UTI. Endorses chest pain as well. Short of breath - sees pulmonary in [place] - was there Thursday and was told to "get admitted". On chronic narcotics. She is in independent living at [facility]. She was actually on a cruise just a month ago to the [place]. She apparently manages all of her medicines. ?of whether she is still on narcotics - son was not aware. She notes that  still has UTI.  Son says he is texting his attorney during the visit with me due to lack "of anyone taking care of her at [facility].     Past Surgical History  Procedure Laterality Date  . Appendectomy  1960  . Total abdominal hysterectomy  1974  . Tendon release  1996    Right forearm and hand  . Knee surgery  2005  . Heel spur surgery Left 2008  . Plantar fascia release Left 2008  . Axillary surgery Left 1978    Multiple "lump" in armpit per pt  . Coccyx removal  1972  . Cardiac catheterization  01/2013    nonobstructive CAD, EF 55-60%  . Transthoracic echocardiogram  01/2013; 04/2014    2014--NORMAL.  2015--focal basal septal hypertrophy, EF 55-60%, grade I diast dysfxn, mild LAE.    . Dilation and curettage of uterus  ? 1970's  . Eye surgery Left 2012-2013    "injections for ~ 1 yr; don't really know what for" (07/12/2013)  . Spirometry  04/25/14    In hosp for acute asthma/COPD flare: mixed obstructive and restrictive lung disease. The FEV1 is severely reduced at 45% predicted.  FEV1 signif decreased compared to prior spirometry 07/23/13.  [name] Kitchen Esophagogastroduodenoscopy N/A 07/19/2014    Gastritis found + in the setting of supratherapeutic INR, +plavix, + meloxicam.  . Left heart catheterization with coronary angiogram N/A 01/30/2013    Procedure: LEFT HEART CATHETERIZATION WITH CORONARY ANGIOGRAM;  Surgeon: Allegra Lai  Terrence Dupont, MD;  Location: [facility];  Service: Cardiovascular;  Laterality: N/A;  . Cardiovascular stress test  02/22/15    Low risk myocard perf imaging; wall motion normal, normal EF     Medications: Current Outpatient Prescriptions  Medication Sig Dispense Refill  . albuterol (PROVENTIL HFA;VENTOLIN HFA) 108 (90 BASE) MCG/ACT inhaler Inhale 2 puffs into the lungs every 6 (six) hours as needed for wheezing or shortness of breath. 1 Inhaler 1  . albuterol (PROVENTIL) (2.5 MG/3ML) 0.083% nebulizer solution Take 3 mLs (2.5 mg total) by nebulization every 6 (six) hours as  needed for wheezing or shortness of breath. 75 mL 1  . ALPRAZolam (XANAX) 1 MG tablet TAKE 1 TABLET 3 TIMES A DAY. 90 tablet 5  . ARIPiprazole (ABILIFY) 5 MG tablet Take 1 tablet (5 mg total) by mouth daily. 30 tablet 6  . atorvastatin (LIPITOR) 20 MG tablet Take 1 tablet (20 mg total) by mouth daily at 6 PM. 90 tablet 1  . budesonide-formoterol (SYMBICORT) 160-4.5 MCG/ACT inhaler Inhale 2 puffs into the lungs 2 (two) times daily. 1 Inhaler 12  . cephALEXin (KEFLEX) 500 MG capsule Take 1 capsule (500 mg total) by mouth 4 (four) times daily. 28 capsule 0  . Cholecalciferol (VITAMIN D3) 50000 UNITS CAPS Take 50,000 Units by mouth once a week. (Patient taking differently: Take 50,000 Units by mouth once a week. Take on Mondays) 12 capsule 0  . diclofenac sodium (VOLTAREN) 1 % GEL Apply 4 g topically 3 (three) times daily. TO BOTH KNEES (Patient taking differently: Apply 4 g topically daily as needed (knee pain). ) 100 g 6  . DULoxetine (CYMBALTA) 30 MG capsule 1 cap po qd to be combined with a 60mg  duloxetine cap (Patient taking differently: Take 30 mg by mouth daily. to be combined with a 60mg  duloxetine cap) 30 capsule 6  . DULoxetine (CYMBALTA) 60 MG capsule 1 cap po qd to be combined with a 30 mg duloxetine cap daily (Patient taking differently: Take 60 mg by mouth daily. to be combined with a 30 mg duloxetine cap daily) 90 capsule 1  . fentaNYL (DURAGESIC - DOSED MCG/HR) 12 MCG/HR Place 1 patch (12.5 mcg total) onto the skin every 3 (three) days. 10 patch 0  . folic acid (FOLVITE) 1 MG tablet Take 2 tablets (2 mg total) by mouth daily. 60 tablet 6  . furosemide (LASIX) 20 MG tablet Take 1 tablet (20 mg total) by mouth daily. 90 tablet 3  . isosorbide mononitrate (IMDUR) 30 MG 24 hr tablet Take 1 tablet (30 mg total) by mouth daily. 90 tablet 1  . nebivolol (BYSTOLIC) 5 MG tablet Take 1 tablet (5 mg total) by mouth daily. 30 tablet 6  . nitroGLYCERIN (NITROSTAT) 0.4 MG SL tablet Place 0.4 mg under  the tongue every 5 (five) minutes as needed for chest pain.    . NON FORMULARY Place 2 L into the nose continuous.    . ondansetron (ZOFRAN) 4 MG tablet Take 1 tablet (4 mg total) by mouth every 6 (six) hours. (Patient taking differently: Take 4 mg by mouth every 6 (six) hours as needed for nausea or vomiting. ) 12 tablet 0  . oxyCODONE-acetaminophen (PERCOCET) 10-325 MG tablet Take 1 tablet by mouth every 6 (six) hours as needed for pain. ONE MONTH SUPPLY 100 tablet 0  . pantoprazole (PROTONIX) 40 MG tablet Take 1 tablet (40 mg total) by mouth daily. 90 tablet 1  . polyethylene glycol powder (GLYCOLAX/MIRALAX) powder Take 17  g by mouth daily. (Patient taking differently: Take 17 g by mouth daily as needed for mild constipation or moderate constipation. ) 3350 g 6  . potassium chloride SA (K-DUR,KLOR-CON) 20 MEQ tablet Take 1 tablet (20 mEq total) by mouth daily. 7 tablet 0  . pregabalin (LYRICA) 300 MG capsule Take 1 capsule twice a day (Patient taking differently: Take 300 mg by mouth 2 (two) times daily. ) 60 capsule 5  . sulfamethoxazole-trimethoprim (BACTRIM DS,SEPTRA DS) 800-160 MG per tablet Take 1 tablet by mouth daily. 30 tablet 6  . traZODone (DESYREL) 50 MG tablet Take 3 tablets (150 mg total) by mouth at bedtime. 90 tablet 6   No current facility-administered medications for this visit.   Facility-Administered Medications Ordered in Other Visits  Medication Dose Route Frequency Provider Last Rate Last Dose  . ferumoxytol (FERAHEME) 510 mg in sodium chloride 0.9 % 100 mL IVPB  510 mg Intravenous Once Volanda Napoleon, MD        Allergies: Allergies  Allergen Reactions  . Oxycodone     Son asked [place] not to prescribe narcotics for patient due to patient dependence.  See 08/26/14 note  . Penicillins Itching, Swelling and Rash    Tolerated Cefepime in ED. Has patient had a PCN reaction causing immediate rash, facial/tongue/throat swelling, SOB or lightheadedness with hypotension: Yes Has  patient had a PCN reaction causing severe rash involving mucus membranes or skin necrosis: No Has patient had a PCN reaction that required hospitalization: No  Has patient had a PCN reaction occurring within the last 10 years: No     Social History: The patient  reports that she has never smoked. She has never used smokeless tobacco. She reports that she does not drink alcohol or use illicit drugs.   Family History: The patient's family history includes Arthritis in her mother; Diabetes in her father; Heart disease in her father; Hypertension in her father; Kidney disease in her mother; Stroke in her father.   Review of Systems: Please see the history of present illness.   Otherwise, the review of systems is positive for none.   All other systems are reviewed and negative.   Physical Exam: VS:  BP 120/60 mmHg  Pulse 94  Ht 5\' 4"  (1.626 m)  Wt 173 lb 3.2 oz (78.563 kg)  BMI 29.72 kg/m2  SpO2 91% .  BMI Body mass index is 29.72 kg/(m^2).  Wt Readings from Last 3 Encounters:  10/16/15 173 lb 3.2 oz (78.563 kg)  10/12/15 170 lb (77.111 kg)  09/13/15 170 lb (77.111 kg)    General: Very flat affect. ?oversedated. She does not appear to be in no acute distress.  HEENT: Normal. Neck: Supple, no JVD, carotid bruits, or masses noted.  Cardiac: Regular rate and rhythm. No murmur that I can apprecia. No +2 edema up to the thighs.  Respiratory:  Lungs are clear to auscultation bilaterally with normal work of breathing. Oxygen sat was 91%  GI: Soft and nontender.  MS: No deformity or atrophy. Gait very unsteady. ROM intact. Slow to move and difficult to get on exam table.  Skin: Warm and dry. Color is pale Neuro:  Strength and sensation are intact and no gross focal deficits noted but she seems to just move in a very slow fashion overall.  Psych: Alert, appropriate and with normal affect.   LABORATORY DATA:  EKG:  EKG is ordered today. This shows sinus with RBBB, inferior ischemia ?new.  Reviewed with Dr. Angelena Form (DOD).  Lab Results  Component Value Date   WBC 7.4 10/12/2015   HGB 12.8 10/12/2015   HCT 39.4 10/12/2015   PLT 170 10/12/2015   GLUCOSE 96 10/12/2015   CHOL 152 01/31/2013   TRIG 92 01/31/2013   HDL 44 01/31/2013   LDLCALC 90 01/31/2013   ALT 17 06/05/2015   AST 23 06/05/2015   NA 141 10/12/2015   K 2.8* 10/12/2015   CL 100* 10/12/2015   CREATININE 1.01* 10/12/2015   BUN 8 10/12/2015   CO2 33* 10/12/2015   TSH 3.736 07/20/2013   INR 1.0* 10/24/2014   HGBA1C 5.7* 01/31/2013    BNP (last 3 results) No results for input(s): BNP in the last 8760 hours.  ProBNP (last 3 results)  Recent Labs  08/24/15 1105  PROBNP 92.0     Other Studies Reviewed Today:  Echo Study Conclusions from 08/2015  - Left ventricle: The cavity size was normal. Systolic function was normal. The estimated ejection fraction was in the range of 60% to 65%. Wall motion was normal; there were no regional wall motion abnormalities. There was an increased relative contribution of atrial contraction to ventricular filling. Doppler parameters are consistent with abnormal left ventricular relaxation (grade 1 diastolic dysfunction). - Tricuspid valve: There was trivial regurgitation.  Assessment/Plan: 1. Acute on chronic diastolic HF - swelling all the way up to her thighs.   2. Chest pain - abnormal EKG - with known CAD  3. Fatigue/extreme lethargy - very slow to move/respond/talk - may need CT of the head - she is moving all extremities.   4. CAD - she endorses complaint of chest tightness.   5. History of MR - noted to be 4+ on prior cath - none noted on most recent Echo  6. Chronic pain syndrome - ?if she is oversedated - will give her a dose of Narcan  7. UTI - needs repeat UA and possible treatment  8. Hypokalemia - last potassium was 2.8 - needs labs repeated  9. Dyspnea - she is on chronic oxygen - apparently has prior PE history and asbestos  exposure - she was placed on our oxygen here. Would check d dimer.   Discussed with Dr. Angelena Form - multiple issues with generalized lethargy. Admit to tele. Labs. CXR. D dimer. One dose of Narcan. May need hospitalist to see. Will check serial enzymes and diurese with IV lasix. Her potassium needs replaced. Recheck for UTI.     Current medicines are reviewed with the patient today.  The patient does not have concerns regarding medicines other than what has been noted above.  The following changes have been made:  See above.  Labs/ tests ordered today include:   No orders of the defined types were placed in this encounter.     Disposition:   Further disposition to follow.    Patient is agreeable to this plan and will call if any problems develop in the interim.   Signed: Burtis Junes, RN, ANP-C 10/16/2015 11:32 AM  [facility] [address] [place],   [address] Phone: [phone] Fax: [phone]

## 2015-10-16 NOTE — Progress Notes (Signed)
Nurse called to inquire about giving narcan injection. Patient now A+Ox4, totally alert, pulse ox 96% on [place]. Will hold off injection for now, reserving for recurrent lethargy. Almin Livingstone PA-C

## 2015-10-16 NOTE — H&P (Signed)
Linda [name]  10/16/2015 10:30 AM  Office Visit  MRN:  [id number]   Description: Female DOB: [birth year]  Provider: Burtis Junes, NP  Department: [address] [facility]       Vital Signs  Most recent update: 10/16/2015 11:06 AM by Linda [name]    BP Pulse Ht Wt BMI SpO2    120/60 mmHg 94 5\' 4"  (1.626 m) 173 lb 3.2 oz (78.563 kg) 29.72 kg/m2 91%    Vitals History     Progress Notes      Linda Junes, NP at 10/16/2015 11:11 AM     Status: Signed       Expand All Collapse All       CARDIOLOGY OFFICE NOTE  Date: 10/16/2015    Linda [name] Date of Birth: 1946/05/07 Medical Record #[id number]  PCP: Linda Sou, MD Cardiologist: Linda [name]   Chief Complaint  Patient presents with  . Shortness of Breath    Work in visit - seen for Dr. Stanford [name]    History of Present Illness: Linda [name] is a 69 y.o. female who presents today for a work in visit. Seen for Dr. Stanford [name]. She has diastolic congestive heart failure. Cardiac catheterization February 2014 showed nonobstructive disease (most significant 50-60 RCA) and normal LV function; note of 4+ MR. Renal Dopplers May 2014 showed no stenosis. Echocardiogram May 2015 showed normal LV function, grade 1 diastolic dysfunction, mild left atrial enlargement and mild mitral regurgitation. Nuclear study March 2016 showed ejection fraction 96% and no ischemia or infarction. Also with history of presumed orthostatic syncope. Previously followed by Dr Linda [name].  Her other problems are as noted in the past medical history.  Last seen by Dr. Stanford [name] back in August - seemed to have stable symptoms.   In the ER last week with a near syncopal spell after having a cortisone injection.   Comes in today. Here with her son. Her oxygen tank is empty. They were late for the appointment because of the difficulty in getting her here. His name is Linda [name] - he gives some of the history - he is upset  that she was sent home Saturday. Very weak. Very fatigued. Progressive over the last few weeks. Can't do "anything". More swelling. Probably still with a UTI. Endorses chest pain as well. Short of breath - sees pulmonary in [place] - was there Thursday and was told to "get admitted". On chronic narcotics. She is in independent living at [facility]. She was actually on a cruise just a month ago to the [place]. She apparently manages all of her medicines. ?of whether she is still on narcotics - son was not aware. She notes that still has UTI.  Son says he is texting his attorney during the visit with me due to lack "of anyone taking care of her at [facility].     Past Surgical History  Procedure Laterality Date  . Appendectomy  1960  . Total abdominal hysterectomy  1974  . Tendon release  1996    Right forearm and hand  . Knee surgery  2005  . Heel spur surgery Left 2008  . Plantar fascia release Left 2008  . Axillary surgery Left 1978    Multiple "lump" in armpit per pt  . Coccyx removal  1972  . Cardiac catheterization  01/2013    nonobstructive CAD, EF 55-60%  . Transthoracic echocardiogram  01/2013; 04/2014    2014--NORMAL. 2015--focal basal septal hypertrophy, EF 55-60%, grade I diast dysfxn,  mild LAE.   . Dilation and curettage of uterus  ? 1970's  . Eye surgery Left 2012-2013    "injections for ~ 1 yr; don't really know what for" (07/12/2013)  . Spirometry  04/25/14    In hosp for acute asthma/COPD flare: mixed obstructive and restrictive lung disease. The FEV1 is severely reduced at 45% predicted. FEV1 signif decreased compared to prior spirometry 07/23/13.  [name] Kitchen Esophagogastroduodenoscopy N/A 07/19/2014    Gastritis found + in the setting of supratherapeutic INR, +plavix, + meloxicam.  . Left heart catheterization with coronary angiogram N/A 01/30/2013    Procedure: LEFT HEART CATHETERIZATION  WITH CORONARY ANGIOGRAM; Surgeon: Linda Demark, MD; Location: [facility] CATH LAB; Service: Cardiovascular; Laterality: N/A;  . Cardiovascular stress test  02/22/15    Low risk myocard perf imaging; wall motion normal, normal EF     Medications: Current Outpatient Prescriptions  Medication Sig Dispense Refill  . albuterol (PROVENTIL HFA;VENTOLIN HFA) 108 (90 BASE) MCG/ACT inhaler Inhale 2 puffs into the lungs every 6 (six) hours as needed for wheezing or shortness of breath. 1 Inhaler 1  . albuterol (PROVENTIL) (2.5 MG/3ML) 0.083% nebulizer solution Take 3 mLs (2.5 mg total) by nebulization every 6 (six) hours as needed for wheezing or shortness of breath. 75 mL 1  . ALPRAZolam (XANAX) 1 MG tablet TAKE 1 TABLET 3 TIMES A DAY. 90 tablet 5  . ARIPiprazole (ABILIFY) 5 MG tablet Take 1 tablet (5 mg total) by mouth daily. 30 tablet 6  . atorvastatin (LIPITOR) 20 MG tablet Take 1 tablet (20 mg total) by mouth daily at 6 PM. 90 tablet 1  . budesonide-formoterol (SYMBICORT) 160-4.5 MCG/ACT inhaler Inhale 2 puffs into the lungs 2 (two) times daily. 1 Inhaler 12  . cephALEXin (KEFLEX) 500 MG capsule Take 1 capsule (500 mg total) by mouth 4 (four) times daily. 28 capsule 0  . Cholecalciferol (VITAMIN D3) 50000 UNITS CAPS Take 50,000 Units by mouth once a week. (Patient taking differently: Take 50,000 Units by mouth once a week. Take on Mondays) 12 capsule 0  . diclofenac sodium (VOLTAREN) 1 % GEL Apply 4 g topically 3 (three) times daily. TO BOTH KNEES (Patient taking differently: Apply 4 g topically daily as needed (knee pain). ) 100 g 6  . DULoxetine (CYMBALTA) 30 MG capsule 1 cap po qd to be combined with a 60mg  duloxetine cap (Patient taking differently: Take 30 mg by mouth daily. to be combined with a 60mg  duloxetine cap) 30 capsule 6  . DULoxetine (CYMBALTA) 60 MG capsule 1 cap po qd to be combined with a 30 mg duloxetine cap daily (Patient  taking differently: Take 60 mg by mouth daily. to be combined with a 30 mg duloxetine cap daily) 90 capsule 1  . fentaNYL (DURAGESIC - DOSED MCG/HR) 12 MCG/HR Place 1 patch (12.5 mcg total) onto the skin every 3 (three) days. 10 patch 0  . folic acid (FOLVITE) 1 MG tablet Take 2 tablets (2 mg total) by mouth daily. 60 tablet 6  . furosemide (LASIX) 20 MG tablet Take 1 tablet (20 mg total) by mouth daily. 90 tablet 3  . isosorbide mononitrate (IMDUR) 30 MG 24 hr tablet Take 1 tablet (30 mg total) by mouth daily. 90 tablet 1  . nebivolol (BYSTOLIC) 5 MG tablet Take 1 tablet (5 mg total) by mouth daily. 30 tablet 6  . nitroGLYCERIN (NITROSTAT) 0.4 MG SL tablet Place 0.4 mg under the tongue every 5 (five) minutes as needed for chest pain.    [name] Kitchen  NON FORMULARY Place 2 L into the nose continuous.    . ondansetron (ZOFRAN) 4 MG tablet Take 1 tablet (4 mg total) by mouth every 6 (six) hours. (Patient taking differently: Take 4 mg by mouth every 6 (six) hours as needed for nausea or vomiting. ) 12 tablet 0  . oxyCODONE-acetaminophen (PERCOCET) 10-325 MG tablet Take 1 tablet by mouth every 6 (six) hours as needed for pain. ONE MONTH SUPPLY 100 tablet 0  . pantoprazole (PROTONIX) 40 MG tablet Take 1 tablet (40 mg total) by mouth daily. 90 tablet 1  . polyethylene glycol powder (GLYCOLAX/MIRALAX) powder Take 17 g by mouth daily. (Patient taking differently: Take 17 g by mouth daily as needed for mild constipation or moderate constipation. ) 3350 g 6  . potassium chloride SA (K-DUR,KLOR-CON) 20 MEQ tablet Take 1 tablet (20 mEq total) by mouth daily. 7 tablet 0  . pregabalin (LYRICA) 300 MG capsule Take 1 capsule twice a day (Patient taking differently: Take 300 mg by mouth 2 (two) times daily. ) 60 capsule 5  . sulfamethoxazole-trimethoprim (BACTRIM DS,SEPTRA DS) 800-160 MG per tablet Take 1 tablet by mouth daily. 30 tablet 6  . traZODone  (DESYREL) 50 MG tablet Take 3 tablets (150 mg total) by mouth at bedtime. 90 tablet 6   No current facility-administered medications for this visit.   Facility-Administered Medications Ordered in Other Visits  Medication Dose Route Frequency Provider Last Rate Last Dose  . ferumoxytol (FERAHEME) 510 mg in sodium chloride 0.9 % 100 mL IVPB 510 mg Intravenous Once Volanda Napoleon, MD      Allergies: Allergies  Allergen Reactions  . Oxycodone     Son asked [place] not to prescribe narcotics for patient due to patient dependence. See 08/26/14 note  . Penicillins Itching, Swelling and Rash    Tolerated Cefepime in ED. Has patient had a PCN reaction causing immediate rash, facial/tongue/throat swelling, SOB or lightheadedness with hypotension: Yes Has patient had a PCN reaction causing severe rash involving mucus membranes or skin necrosis: No Has patient had a PCN reaction that required hospitalization: No  Has patient had a PCN reaction occurring within the last 10 years: No     Social History: The patient  reports that she has never smoked. She has never used smokeless tobacco. She reports that she does not drink alcohol or use illicit drugs.  Family History: The patient's family history includes Arthritis in her mother; Diabetes in her father; Heart disease in her father; Hypertension in her father; Kidney disease in her mother; Stroke in her father.   Review of Systems: Please see the history of present illness. Otherwise, the review of systems is positive for none. All other systems are reviewed and negative.   Physical Exam: VS: BP 120/60 mmHg  Pulse 94  Ht 5\' 4"  (1.626 m)  Wt 173 lb 3.2 oz (78.563 kg)  BMI 29.72 kg/m2  SpO2 91% . BMI Body mass index is 29.72 kg/(m^2).  Wt Readings from Last 3 Encounters:  10/16/15 173 lb 3.2 oz (78.563 kg)  10/12/15 170 lb (77.111 kg)  09/13/15 170 lb (77.111 kg)    General: Very  flat affect. ?oversedated. Lethargic. Very slow to move, even to lift her head up. She does not appear to be in no acute distress.  HEENT: Normal.  Neck: Supple, no JVD, carotid bruits, or masses noted.  Cardiac: Regular rate and rhythm. No murmur that I can apprecia. No +2 edema up to the thighs.  Respiratory:  Lungs are clear to auscultation bilaterally with normal work of breathing. Oxygen sat was 91%  GI: Soft and nontender.  MS: No deformity or atrophy. Gait very unsteady. ROM intact. Slow to move and difficult to get on exam table.  Skin: Warm and dry. Color is pale Neuro: Strength and sensation are intact and no gross focal deficits noted but she seems to just move in a very slow fashion overall.  Psych: Alert, appropriate and with normal affect.   LABORATORY DATA:  EKG: EKG is ordered today. This shows sinus with RBBB, inferior ischemia ?new. Reviewed with Dr. Angelena Form (DOD).   Recent Labs    Lab Results  Component Value Date   WBC 7.4 10/12/2015   HGB 12.8 10/12/2015   HCT 39.4 10/12/2015   PLT 170 10/12/2015   GLUCOSE 96 10/12/2015   CHOL 152 01/31/2013   TRIG 92 01/31/2013   HDL 44 01/31/2013   LDLCALC 90 01/31/2013   ALT 17 06/05/2015   AST 23 06/05/2015   NA 141 10/12/2015   K 2.8* 10/12/2015   CL 100* 10/12/2015   CREATININE 1.01* 10/12/2015   BUN 8 10/12/2015   CO2 33* 10/12/2015   TSH 3.736 07/20/2013   INR 1.0* 10/24/2014   HGBA1C 5.7* 01/31/2013      BNP (last 3 results)  Recent Labs (within last 365 days)    No results for input(s): BNP in the last 8760 hours.    ProBNP (last 3 results)  Recent Labs (within last 365 days)     Recent Labs  08/24/15 1105  PROBNP 92.0       Other Studies Reviewed Today:  Echo Study Conclusions from 08/2015  - Left ventricle: The cavity size was normal. Systolic function was normal. The estimated ejection  fraction was in the range of 60% to 65%. Wall motion was normal; there were no regional wall motion abnormalities. There was an increased relative contribution of atrial contraction to ventricular filling. Doppler parameters are consistent with abnormal left ventricular relaxation (grade 1 diastolic dysfunction). - Tricuspid valve: There was trivial regurgitation.  Assessment/Plan: 1. Acute on chronic diastolic HF - swelling all the way up to her thighs.   2. Chest pain - abnormal EKG - ? More inferior changes - with known CAD  3. Fatigue/extreme lethargy - very slow to move/respond/talk - may need CT of the head - she is moving all extremities.   4. CAD - she endorses complaint of chest tightness.   5. History of MR - noted to be 4+ on prior cath - none noted on most recent Echo  6. Chronic pain syndrome - ?if she is oversedated - will give her a dose of Narcan  7. UTI - needs repeat UA and possible treatment  8. Hypokalemia - last potassium was 2.8 - needs labs repeated  9. Dyspnea - she is on chronic oxygen - apparently has prior PE history and asbestos exposure - she was placed on our oxygen here. Would check d dimer.   Discussed with Dr. Angelena Form - multiple issues with generalized lethargy. Admit to tele. Labs. CXR. D dimer. One dose of Narcan. May need hospitalist to see. Will check serial enzymes and diurese with IV lasix. Her potassium needs replaced. Recheck for UTI. She continues on antibiotics.   Her narcotics have NOT been ordered - this included oxycodone, fentanyl patch and Xanax.     Current medicines are reviewed with the patient today. The patient does not have concerns regarding medicines other  than what has been noted above.  The following changes have been made: See above.  Labs/ tests ordered today include:   No orders of the defined types were placed in this encounter.    Disposition: Further disposition to follow.   Patient is  agreeable to this plan and will call if any problems develop in the interim.   Signed: Burtis Junes, RN, ANP-C 10/16/2015 11:32 AM  [facility] [address] [place], [place] [address] Phone: [phone] Fax: [phone]    I have reviewed this patient with Truitt Merle, NP. I agree that she needs admission. I agree with the plan as outlined above.   Linda [name] 10/16/2015 1:13 PM                   Referring Provider     Linda Sou, MD     Diagnoses     Dyspnea - Primary    ICD-9-CM: 786.09 ICD-10-CM: R06.00    Chronic diastolic CHF (congestive heart failure), NYHA class 1 ([place])     ICD-9-CM: 428.32 ICD-10-CM: I50.32       Reason for Visit     Shortness of Breath    Work in visit - seen for Dr. Stanford [name]    Reason for Visit History        Discontinued Medications       Reason for Discontinue    lidocaine (LIDODERM) 5 % Completed Course      Level of Service     PR OFFICE OUTPATIENT VISIT 25 MINUTES [99214]      Follow-up and Disposition     Routing History       All Charges for This Encounter     Code Description Service Date Service Provider Modifiers Qty    93000 PR ELECTROCARDIOGRAM, COMPLETE 10/16/2015 Linda Junes, NP  1    [phone] PR OFFICE OUTPATIENT VISIT 25 MINUTES 10/16/2015 Linda Junes, NP  1    [phone] PR NO ASP THERP USED 10/16/2015 Linda Junes, NP  1    [phone] PR CURRENT TOBACCO NON-USER 10/16/2015 Linda Junes, NP  1      AVS Reports     No AVS Snapshots are available for this encounter.     Patient Instructions     We are admitting you to the hospital today.         Patient Instructions History      Routing History     There are no sent or routed communications associated with this encounter.     Chart Reviewed By     Lelon Perla, MD on 10/16/2015 12:29 PM      Previous Visit       Provider Department Encounter #    10/16/2015 9:12 AM Truitt Merle, NP [facility] [id number]

## 2015-10-17 ENCOUNTER — Observation Stay (HOSPITAL_COMMUNITY): Payer: Medicare Other

## 2015-10-17 ENCOUNTER — Ambulatory Visit: Payer: Medicare Other

## 2015-10-17 ENCOUNTER — Inpatient Hospital Stay: Admission: RE | Admit: 2015-10-17 | Payer: Medicare Other | Source: Ambulatory Visit

## 2015-10-17 ENCOUNTER — Encounter (HOSPITAL_COMMUNITY): Payer: Self-pay

## 2015-10-17 DIAGNOSIS — I5031 Acute diastolic (congestive) heart failure: Secondary | ICD-10-CM | POA: Diagnosis not present

## 2015-10-17 DIAGNOSIS — I5033 Acute on chronic diastolic (congestive) heart failure: Secondary | ICD-10-CM | POA: Diagnosis not present

## 2015-10-17 DIAGNOSIS — R0600 Dyspnea, unspecified: Secondary | ICD-10-CM | POA: Diagnosis not present

## 2015-10-17 DIAGNOSIS — E785 Hyperlipidemia, unspecified: Secondary | ICD-10-CM | POA: Diagnosis not present

## 2015-10-17 DIAGNOSIS — R0602 Shortness of breath: Secondary | ICD-10-CM | POA: Diagnosis not present

## 2015-10-17 DIAGNOSIS — G894 Chronic pain syndrome: Secondary | ICD-10-CM | POA: Diagnosis not present

## 2015-10-17 DIAGNOSIS — R0789 Other chest pain: Secondary | ICD-10-CM | POA: Diagnosis not present

## 2015-10-17 DIAGNOSIS — F411 Generalized anxiety disorder: Secondary | ICD-10-CM | POA: Diagnosis not present

## 2015-10-17 DIAGNOSIS — I2510 Atherosclerotic heart disease of native coronary artery without angina pectoris: Secondary | ICD-10-CM | POA: Diagnosis not present

## 2015-10-17 DIAGNOSIS — G8929 Other chronic pain: Secondary | ICD-10-CM | POA: Diagnosis not present

## 2015-10-17 DIAGNOSIS — R4182 Altered mental status, unspecified: Secondary | ICD-10-CM | POA: Diagnosis not present

## 2015-10-17 LAB — PRESCRIPTION MONITORING PROFILE (SOLSTAS)
Barbiturate Screen, Urine: NEGATIVE ng/mL
Buprenorphine, Urine: NEGATIVE ng/mL
CANNABINOID SCRN UR: NEGATIVE ng/mL
CARISOPRODOL, URINE: NEGATIVE ng/mL
COCAINE METABOLITES: NEGATIVE ng/mL
Creatinine, Urine: 224 mg/dL (ref >20.0)
ECSTASY: NEGATIVE ng/mL
MEPERIDINE UR: NEGATIVE ng/mL
METHADONE SCREEN, URINE: NEGATIVE ng/mL
Nitrites, Initial: NEGATIVE ug/mL
PROPOXYPHENE: NEGATIVE ng/mL
Tapentadol, urine: NEGATIVE ng/mL
Tramadol Scrn, Ur: NEGATIVE ng/mL
Zolpidem, Urine: NEGATIVE ng/mL
pH, Initial: 6.8 pH (ref 4.5–8.9)

## 2015-10-17 LAB — BASIC METABOLIC PANEL
Anion gap: 10 (ref 5–15)
BUN: 9 mg/dL (ref 6–20)
CO2: 34 mmol/L — ABNORMAL HIGH (ref 22–32)
Calcium: 9.2 mg/dL (ref 8.9–10.3)
Chloride: 96 mmol/L — ABNORMAL LOW (ref 101–111)
Creatinine, Ser: 0.95 mg/dL (ref 0.44–1.00)
GFR calc Af Amer: >60 mL/min (ref >60)
GFR calc non Af Amer: 60 mL/min — ABNORMAL LOW (ref >60)
Glucose, Bld: 116 mg/dL — ABNORMAL HIGH (ref 65–99)
Potassium: 4 mmol/L (ref 3.5–5.1)
Sodium: 140 mmol/L (ref 135–145)

## 2015-10-17 LAB — TSH: TSH: 0.508 u[IU]/mL (ref 0.350–4.500)

## 2015-10-17 LAB — TROPONIN I
Troponin I: <0.03 ng/mL (ref <0.031)
Troponin I: <0.03 ng/mL (ref <0.031)

## 2015-10-17 LAB — MAGNESIUM: MAGNESIUM: 1.9 mg/dL (ref 1.7–2.4)

## 2015-10-17 MED ORDER — IOHEXOL 350 MG/ML SOLN
80.0000 mL | Freq: Once | INTRAVENOUS | Status: AC | PRN
  Administered 2015-10-17: 80 mL via INTRAVENOUS

## 2015-10-17 MED ORDER — ALPRAZOLAM 0.5 MG PO TABS
1.0000 mg | ORAL_TABLET | Freq: Three times a day (TID) | ORAL | Status: DC
  Administered 2015-10-17 – 2015-10-20 (×10): 1 mg via ORAL
  Filled 2015-10-17 (×10): qty 2

## 2015-10-17 MED ORDER — OXYCODONE-ACETAMINOPHEN 5-325 MG PO TABS
1.0000 | ORAL_TABLET | Freq: Three times a day (TID) | ORAL | Status: DC | PRN
  Administered 2015-10-17 – 2015-10-20 (×7): 1 via ORAL
  Filled 2015-10-17 (×8): qty 1

## 2015-10-17 NOTE — Progress Notes (Signed)
PT Cancellation Note  Patient Details Name: Linda [name] MRN: [id number] DOB: 06/19/1946   Cancelled Treatment:    Reason Eval/Treat Not Completed: Patient at procedure or test/unavailable   Duncan Dull 10/17/2015, 2:29 PM Alben Deeds, [place] DPT  [phone]

## 2015-10-17 NOTE — Progress Notes (Addendum)
Heart Failure Navigator Consult Note  Presentation: Linda [name] is a 69 y.o. female who presents today for a work in visit. Seen for Linda [name]. She has diastolic congestive heart failure. Cardiac catheterization February 2014 showed nonobstructive disease (most significant 50-60 RCA) and normal LV function; note of 4+ MR. Renal Dopplers May 2014 showed no stenosis. Echocardiogram May 2015 showed normal LV function, grade 1 diastolic dysfunction, mild left atrial enlargement and mild mitral regurgitation. Nuclear study March 2016 showed ejection fraction 96% and no ischemia or infarction. Also with history of presumed orthostatic syncope. Previously followed by Linda [name].    Social History   Social History  . Marital Status: Widowed    Spouse Name: N/A  . Number of Children: 2  . Years of Education: N/A   Social History Main Topics  . Smoking status: Never Smoker   . Smokeless tobacco: Never Used     Comment: never used tobacco  . Alcohol Use: No  . Drug Use: No  . Sexual Activity: Not Currently   Other Topics Concern  . Not on file   Social History Narrative   Widowed, 2 sons.  Relocated to [place] 09/2012 to be closer to her son who has MS.   Husband d 2015--mesothelioma.   Occupation: former Pharmacist, hospital.   Education: masters degree level.   No T/A/Ds.    ECHO:Study Conclusions-08/24/15  - Left ventricle: The cavity size was normal. Systolic function was normal. The estimated ejection fraction was in the range of 60% to 65%. Wall motion was normal; there were no regional wall motion abnormalities. There was an increased relative contribution of atrial contraction to ventricular filling. Doppler parameters are consistent with abnormal left ventricular relaxation (grade 1 diastolic dysfunction). - Tricuspid valve: There was trivial regurgitation.  Transthoracic echocardiography. M-mode, complete 2D, spectral Doppler, and color Doppler. Birthdate: Patient  birthdate: [birth year]. Age: Patient is 69 yr old. Sex: Gender: female. BMI: 29.5 kg/m2. Blood pressure:   108/72 Patient status: Outpatient. Study date: Study date: 08/24/2015. Study time: 11:21 AM. Location: Echo laboratory.  BNP    Component Value Date/Time   BNP 15.4 10/16/2015 1500    ProBNP    Component Value Date/Time   PROBNP 92.0 08/24/2015 1105     Education Assessment and Provision:  Detailed education and instructions provided on heart failure disease management including the following:  Signs and symptoms of Heart Failure When to call the physician Importance of daily weights Low sodium diet Fluid restriction Medication management Anticipated future follow-up appointments  Patient education given on each of the above topics.  Patient acknowledges understanding and acceptance of all instructions.  I spoke briefly with Linda [name] about her HF.  She has somewhat of a flat affect and is slow to respond at times.  She admits that she was not told to weigh daily until recently and had started doing that.  She tells me that her weight fluctuates greatly.  I reviewed with her when to contact the physician regarding weight increases and how those relate to signs and symptoms of HF.  After we discussed the importance of a low sodium diet and high sodium foods to avoid--she admits that she recently bought a case of Ramen Noodles at [employer].  I discouraged her from eating those as Sodium content is very high.  She does say that the majority of her meals she eats at [employer].  She denies any issues getting or taking prescribed medications.  She lives at [facility] and has a son that lives locally whom she can call on for support if needed--however she says "I don't bother him much".  She tells me that she has been falling at home and has been very unsteady walking lately--using the walls for support in her Apt.  She also says  that she has "bad knees" and has been told that she cannot have a knee replacement.  She has been seen as an outpatient at [facility] and likely will follow there.  Education Materials:  "Living Better With Heart Failure" Booklet, Daily Weight Tracker Tool    High Risk Criteria for Readmission and/or Poor Patient Outcomes:   EF <30%- No 60-65% with grade 1 dias dys.  2 or more admissions in 6 months-  no  Difficult social situation- ? -Lives in [place] currently.  ? Ability to provide self- care  Demonstrates medication noncompliance- Denies   Barriers of Care:  Knowledge and compliance  Discharge Planning:   Plans to return to to [place].  She would benefit from [facility] and PT for ongoing HF Education, Compliance reinforcement, and symptom recognition.

## 2015-10-17 NOTE — Progress Notes (Signed)
TELEMETRY: Reviewed telemetry pt in NSR: Filed Vitals:   10/17/15 0050 10/17/15 0621 10/17/15 0642 10/17/15 1008  BP: 130/55 117/60  119/69  Pulse: 78 70  71  Temp: 98.5 F (36.9 C) 97.7 F (36.5 C)  97.7 F (36.5 C)  TempSrc: Oral Oral  Oral  Resp: 18 18    Height:      Weight:   76.132 kg (167 lb 13.4 oz)   SpO2:  97%  97%    Intake/Output Summary (Last 24 hours) at 10/17/15 1053 Last data filed at 10/17/15 1013  Gross per 24 hour  Intake    683 ml  Output   1650 ml  Net   -967 ml   Filed Weights   10/16/15 1259 10/17/15 0642  Weight: 77.792 kg (171 lb 8 oz) 76.132 kg (167 lb 13.4 oz)    Subjective Feels better. Reports good diuresis. Has chronic back pain and "bone on bone" knee pain. Reports taking Percocet twice a day at home. Was also taking Xanax 1 mg tid for panic attacks. Has been on Bactrim for one week for UTI.   [name] Kitchen ALPRAZolam  1 mg Oral TID  . ARIPiprazole  5 mg Oral Daily  . aspirin EC  81 mg Oral Daily  . atorvastatin  20 mg Oral q1800  . budesonide-formoterol  2 puff Inhalation BID  . diclofenac sodium  4 g Topical TID  . DULoxetine  60 mg Oral Daily   And  . DULoxetine  30 mg Oral Daily  . folic acid  1 mg Oral Daily  . furosemide  40 mg Intravenous Q12H  . heparin  5,000 Units Subcutaneous 3 times per day  . isosorbide mononitrate  30 mg Oral Daily  . lisinopril  2.5 mg Oral Daily  . nebivolol  5 mg Oral Daily  . pantoprazole  40 mg Oral Daily  . potassium chloride  40 mEq Oral BID  . pregabalin  300 mg Oral BID  . sodium chloride  3 mL Intravenous Q12H      LABS: Basic Metabolic Panel:  Recent Labs  10/16/15 1730 10/17/15 0558  NA 142 140  K 3.3* 4.0  CL 100* 96*  CO2 32 34*  GLUCOSE 104* 116*  BUN 10 9  CREATININE 0.93 0.95  CALCIUM 9.6 9.2  MG  --  1.9   Liver Function Tests:  Recent Labs  10/16/15 1730  AST 26  ALT 15  ALKPHOS 84  BILITOT 0.9  PROT 7.1  ALBUMIN 3.8   No results for input(s): LIPASE, AMYLASE in  the last 72 hours. CBC:  Recent Labs  10/16/15 1500  WBC 8.0  NEUTROABS 5.1  HGB 12.9  HCT 38.5  MCV 88.3  PLT 200   Cardiac Enzymes:  Recent Labs  10/16/15 1730 10/16/15 2350 10/17/15 0558  TROPONINI <0.03 <0.03 <0.03   BNP: No results for input(s): PROBNP in the last 72 hours. D-Dimer:  Recent Labs  10/16/15 1730  DDIMER 2.68*   Hemoglobin A1C: No results for input(s): HGBA1C in the last 72 hours. Fasting Lipid Panel: No results for input(s): CHOL, HDL, LDLCALC, TRIG, CHOLHDL, LDLDIRECT in the last 72 hours. Thyroid Function Tests:  Recent Labs  10/17/15 0558  TSH 0.508     Radiology/Studies:  Dg Chest 2 View  10/17/2015  CLINICAL DATA:  Dyspnea EXAM: CHEST - 2 VIEW COMPARISON:  10/13/2015 FINDINGS: Cardiac shadow is at the upper limits of normal but stable. Aortic calcifications are seen. Multiple  calcified pleural plaques are again noted. No focal confluent infiltrate is seen. No sizable effusion is noted. No bony abnormality is seen. IMPRESSION: Chronic calcified pleural plaques.  No acute abnormality noted. Electronically Signed   By: Inez Catalina M.D.   On: 10/17/2015 10:36    PHYSICAL EXAM General: Well developed, well nourished, in no acute distress. Head: Normocephalic, atraumatic, sclera non-icteric, oropharynx is clear Neck: Negative for carotid bruits. JVD not elevated. No adenopathy Lungs: Clear bilaterally to auscultation without wheezes, rales, or rhonchi. Breathing is unlabored. Heart: RRR S1 S2 without murmurs, rubs, or gallops.  Abdomen: Soft, non-tender, non-distended with normoactive bowel sounds. No hepatomegaly. No rebound/guarding. No obvious abdominal masses. Msk:  Strength and tone appears normal for age. Extremities: 1+ edema.  Distal pedal pulses are 2+ and equal bilaterally. Neuro: Alert and oriented X 3. Moves all extremities spontaneously. Psych:  Responds to questions appropriately with a normal affect.  ASSESSMENT AND  PLAN: 1. Acute on chronic diastolic CHF. Diuresing well with IV lasix. Edema improved. Continue diuresis with potassium supplement. Monitor I/O and daily weight. Daily BMET. 2. Dyspnea/ chest pain. Probably due to #1. No evidence of MI. She does have a history of PE and D-dimer is elevated. Will order CT chest to rule out PE. On DVT prophylaxis.  3. UTI. UA and culture are negative. Will DC Bactrim. 4. Chronic pain. Was oversedated yesterday with lethargy. Will DC fentanyl patch. Use percocet PRN 5/325 mg q 8 hr. Was on 10/325 mg. Will resume Xanax 1 mg tid for panic attacks and observe for any deterioration of mental status.  5. CAD 6. Weakness/fatigue. Will ask PT/OT to assess.   Present on Admission:  . Diastolic heart failure ([place])  Signed, Eisa Necaise Martinique, [place] 10/17/2015 10:53 AM

## 2015-10-18 ENCOUNTER — Encounter (HOSPITAL_COMMUNITY): Payer: Self-pay | Admitting: Surgery

## 2015-10-18 DIAGNOSIS — I5033 Acute on chronic diastolic (congestive) heart failure: Principal | ICD-10-CM

## 2015-10-18 DIAGNOSIS — F411 Generalized anxiety disorder: Secondary | ICD-10-CM | POA: Diagnosis not present

## 2015-10-18 DIAGNOSIS — G894 Chronic pain syndrome: Secondary | ICD-10-CM

## 2015-10-18 DIAGNOSIS — I2510 Atherosclerotic heart disease of native coronary artery without angina pectoris: Secondary | ICD-10-CM | POA: Diagnosis not present

## 2015-10-18 DIAGNOSIS — R4182 Altered mental status, unspecified: Secondary | ICD-10-CM | POA: Diagnosis not present

## 2015-10-18 DIAGNOSIS — E785 Hyperlipidemia, unspecified: Secondary | ICD-10-CM | POA: Diagnosis not present

## 2015-10-18 LAB — BASIC METABOLIC PANEL
Anion gap: 14 (ref 5–15)
BUN: 14 mg/dL (ref 6–20)
CO2: 30 mmol/L (ref 22–32)
Calcium: 9.4 mg/dL (ref 8.9–10.3)
Chloride: 96 mmol/L — ABNORMAL LOW (ref 101–111)
Creatinine, Ser: 1.58 mg/dL — ABNORMAL HIGH (ref 0.44–1.00)
GFR calc Af Amer: 37 mL/min — ABNORMAL LOW (ref >60)
GFR calc non Af Amer: 32 mL/min — ABNORMAL LOW (ref >60)
Glucose, Bld: 95 mg/dL (ref 65–99)
Potassium: 4.1 mmol/L (ref 3.5–5.1)
Sodium: 140 mmol/L (ref 135–145)

## 2015-10-18 LAB — URINE CULTURE: Culture: NO GROWTH

## 2015-10-18 MED ORDER — FUROSEMIDE 40 MG PO TABS
40.0000 mg | ORAL_TABLET | Freq: Every day | ORAL | Status: DC
  Administered 2015-10-18 – 2015-10-20 (×3): 40 mg via ORAL
  Filled 2015-10-18 (×3): qty 1

## 2015-10-18 MED ORDER — DULOXETINE HCL 60 MG PO CPEP
90.0000 mg | ORAL_CAPSULE | Freq: Every day | ORAL | Status: DC
  Administered 2015-10-18 – 2015-10-20 (×3): 90 mg via ORAL
  Filled 2015-10-18 (×4): qty 1

## 2015-10-18 NOTE — NC FL2 (Signed)
[place] LEVEL OF CARE SCREENING TOOL     IDENTIFICATION  Patient Name: Linda [name] Birthdate: May 23, 1946 Sex: female Admission Date (Current Location): 10/16/2015  [facility] and [place] Number: Herbalist and Address:  [facility], [place] [address] [place], [place] [address]      Provider Number: [id number]  Attending Physician Name and Address:  Lelon Perla, MD  Relative Name and Phone Number:       Current Level of Care: Hospital Recommended Level of Care: [place] Prior Approval Number:    Date Approved/Denied:   PASRR Number:    Discharge Plan: Other (Comment) (SNF vs ALF with [facility] )    Current Diagnoses: Patient Active Problem List   Diagnosis Date Noted  . Acute on chronic diastolic CHF (congestive heart failure), NYHA class 1 ([place]) 10/17/2015  . Diastolic heart failure ([place]) 10/16/2015  . CAD (coronary artery disease) 08/16/2015  . Chest pain 08/16/2015  . Narrowing of intervertebral disc space 07/17/2015  . Bilateral lower leg pain 01/24/2015  . 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 Chronic pain syndrome 11/21/2014  . Anxiety and depression 11/21/2014  . Abnormal grief reaction 11/21/2014  . Severe persistent asthma 11/21/2014  . Hypokalemia 11/21/2014  . Hyperlipidemia 11/21/2014  . Osteoarthritis of right knee 10/18/2014  . Pernicious anemia 08/24/2014  . Generalized anxiety disorder--with occasional panic attacks.  08/05/2014  . Recurrent major depression-severe ([place]) 08/05/2014  . Multifactorial gait disorder 07/26/2014  . Chronic respiratory failure ([place]) 07/23/2014  . Epistaxis 07/18/2014  . Acute GI bleeding 07/17/2014  . Anemia associated with acute blood loss 07/17/2014  . Syncope 07/17/2014  . History of pulmonary embolism 07/17/2014  . GI bleed 07/17/2014  . Arthritis 05/11/2014  . DDD (degenerative disc disease) 05/11/2014  . Fibrositis 05/11/2014  .  Amianthosis ([place]) 05/11/2014  . Grief 04/28/2014  . OSA (obstructive sleep apnea) 04/24/2014  . Chronic diastolic CHF, NYHA class 1 04/24/2014  . Acute pulmonary embolism ([place]) 08/13/2013  . Pleural plaque with presence of asbestos 07/22/2013  . Polypharmacy 04/26/2013  . Fibromyalgia syndrome 03/01/2013  . Insomnia 11/12/2012  . HTN (hypertension), benign 10/25/2012    Orientation ACTIVITIES/SOCIAL BLADDER RESPIRATION    Self, Time, Situation, Place  Active Incontinent O2 (As needed) (3 liters per min on 02 at facility)  BEHAVIORAL SYMPTOMS/MOOD NEUROLOGICAL BOWEL NUTRITION STATUS      Continent Diet (2 gm sodium)  PHYSICIAN VISITS COMMUNICATION OF NEEDS Height & Weight Skin  60 days Verbally 5\' 4"  (162.6 cm) 167 lbs. Normal          AMBULATORY STATUS RESPIRATION    Supervision limited O2 (As needed) (3 liters per min on 02 at facility)      [place], Dressing ([place]) Bathing Assistance: Limited assistance   Dressing Assistance: Limited assistance      Functional Limitations [place]  PT (By licensed PT), OT (By licensed OT)     PT Frequency: 5 OT Frequency: 5           Additional Factors Info  Code Status Code Status Info: Full             Current Medications (10/18/2015): Current Facility-Administered Medications  Medication Dose Route Frequency Provider Last Rate Last Dose  . 0.9 %  sodium chloride infusion  250 mL Intravenous PRN Burtis Junes, NP      . acetaminophen (TYLENOL) tablet 650 mg  650 mg Oral Q4H PRN Burtis Junes, NP   650 mg at 10/17/15 4098  . albuterol (PROVENTIL) (2.5 MG/3ML) 0.083% nebulizer solution 2.5 mg  2.5 mg Inhalation Q6H PRN Burtis Junes, NP      . ALPRAZolam Duanne Moron) tablet 1 mg  1 mg Oral TID Peter M Martinique, MD   1 mg at 10/18/15 2204  . ARIPiprazole (ABILIFY) tablet 5 mg  5 mg Oral Daily Burtis Junes, NP   5 mg  at 10/18/15 1045  . aspirin EC tablet 81 mg  81 mg Oral Daily Burtis Junes, NP   81 mg at 10/18/15 1045  . atorvastatin (LIPITOR) tablet 20 mg  20 mg Oral q1800 Burtis Junes, NP   20 mg at 10/18/15 1645  . budesonide-formoterol (SYMBICORT) 160-4.5 MCG/ACT inhaler 2 puff  2 puff Inhalation BID Burtis Junes, NP   2 puff at 10/18/15 2057  . diclofenac sodium (VOLTAREN) 1 % transdermal gel 4 g  4 g Topical TID Burtis Junes, NP   4 g at 10/18/15 2205  . DULoxetine (CYMBALTA) DR capsule 90 mg  90 mg Oral Daily Lelon Perla, MD   90 mg at 10/18/15 1045  . folic acid (FOLVITE) tablet 1 mg  1 mg Oral Daily Burtis Junes, NP   1 mg at 10/18/15 1045  . furosemide (LASIX) tablet 40 mg  40 mg Oral Daily Peter M Martinique, MD   40 mg at 10/18/15 1103  . heparin injection 5,000 Units  5,000 Units Subcutaneous 3 times per day Burtis Junes, NP   5,000 Units at 10/18/15 2204  . isosorbide mononitrate (IMDUR) 24 hr tablet 30 mg  30 mg Oral Daily Burtis Junes, NP   30 mg at 10/18/15 1045  . nebivolol (BYSTOLIC) tablet 5 mg  5 mg Oral Daily Burtis Junes, NP   5 mg at 10/18/15 1045  . ondansetron (ZOFRAN) injection 4 mg  4 mg Intravenous Q6H PRN Burtis Junes, NP   4 mg at 10/17/15 1029  . oxyCODONE-acetaminophen (PERCOCET/ROXICET) 5-325 MG per tablet 1 tablet  1 tablet Oral Q8H PRN Peter M Martinique, MD   1 tablet at 10/18/15 2204  . pantoprazole (PROTONIX) EC tablet 40 mg  40 mg Oral Daily Burtis Junes, NP   40 mg at 10/18/15 1045  . potassium chloride SA (K-DUR,KLOR-CON) CR tablet 40 mEq  40 mEq Oral BID Burtis Junes, NP   40 mEq at 10/18/15 2204  . pregabalin (LYRICA) capsule 300 mg  300 mg Oral BID Burtis Junes, NP   300 mg at 10/18/15 2204  . sodium chloride 0.9 % injection 3 mL  3 mL Intravenous Q12H Burtis Junes, NP   3 mL at 10/18/15 2205  . sodium chloride 0.9 % injection 3 mL  3 mL Intravenous PRN Burtis Junes, NP      . traZODone (DESYREL) tablet 150 mg  150 mg Oral QHS  PRN Burtis Junes, NP   150 mg at 10/18/15 2206   Facility-Administered Medications Ordered in Other Encounters  Medication Dose Route Frequency Provider Last Rate Last Dose  . ferumoxytol (FERAHEME) 510 mg in sodium chloride 0.9 % 100 mL IVPB  510 mg Intravenous Once Volanda Napoleon, MD       Do not use this list as  official medication orders. Please verify with discharge summary.  Discharge Medications:   Medication List    ASK your doctor about these medications        albuterol 108 (90 BASE) MCG/ACT inhaler  Commonly known as:  PROVENTIL HFA;VENTOLIN HFA  Inhale 2 puffs into the lungs every 6 (six) hours as needed for wheezing or shortness of breath.     albuterol (2.5 MG/3ML) 0.083% nebulizer solution  Commonly known as:  PROVENTIL  Take 3 mLs (2.5 mg total) by nebulization every 6 (six) hours as needed for wheezing or shortness of breath.     ALPRAZolam 1 MG tablet  Commonly known as:  XANAX  TAKE 1 TABLET 3 TIMES A DAY.     ARIPiprazole 5 MG tablet  Commonly known as:  ABILIFY  Take 1 tablet (5 mg total) by mouth daily.     atorvastatin 20 MG tablet  Commonly known as:  LIPITOR  Take 1 tablet (20 mg total) by mouth daily at 6 PM.     budesonide-formoterol 160-4.5 MCG/ACT inhaler  Commonly known as:  SYMBICORT  Inhale 2 puffs into the lungs 2 (two) times daily.     cephALEXin 500 MG capsule  Commonly known as:  KEFLEX  Take 1 capsule (500 mg total) by mouth 4 (four) times daily.     diclofenac sodium 1 % Gel  Commonly known as:  VOLTAREN  Apply 4 g topically 3 (three) times daily. TO BOTH KNEES     DULoxetine 30 MG capsule  Commonly known as:  CYMBALTA  1 cap po qd to be combined with a 60mg  duloxetine cap     DULoxetine 60 MG capsule  Commonly known as:  CYMBALTA  1 cap po qd to be combined with a 30 mg duloxetine cap daily     fentaNYL 12 MCG/HR  Commonly known as:  DURAGESIC - dosed mcg/hr  Place 1 patch (12.5 mcg total) onto the skin every 3 (three)  days.     folic acid 1 MG tablet  Commonly known as:  FOLVITE  Take 2 tablets (2 mg total) by mouth daily.     furosemide 20 MG tablet  Commonly known as:  LASIX  Take 1 tablet (20 mg total) by mouth daily.     isosorbide mononitrate 30 MG 24 hr tablet  Commonly known as:  IMDUR  Take 1 tablet (30 mg total) by mouth daily.     multivitamin with minerals Tabs tablet  Take 1 tablet by mouth daily.     nebivolol 5 MG tablet  Commonly known as:  BYSTOLIC  Take 1 tablet (5 mg total) by mouth daily.     nitroGLYCERIN 0.4 MG SL tablet  Commonly known as:  NITROSTAT  Place 0.4 mg under the tongue every 5 (five) minutes as needed for chest pain.     ondansetron 4 MG tablet  Commonly known as:  ZOFRAN  Take 1 tablet (4 mg total) by mouth every 6 (six) hours.     OSTEO BI-FLEX ADV JOINT SHIELD Tabs  Take 1 tablet by mouth daily.     oxyCODONE-acetaminophen 10-325 MG tablet  Commonly known as:  PERCOCET  Take 1 tablet by mouth every 6 (six) hours as needed for pain. ONE MONTH SUPPLY     OXYGEN  Inhale 3 L into the lungs continuous.     pantoprazole 40 MG tablet  Commonly known as:  PROTONIX  Take 1 tablet (40 mg total) by mouth daily.  polyethylene glycol powder powder  Commonly known as:  GLYCOLAX/MIRALAX  Take 17 g by mouth daily.     potassium chloride SA 20 MEQ tablet  Commonly known as:  K-DUR,KLOR-CON  Take 1 tablet (20 mEq total) by mouth daily.     pregabalin 300 MG capsule  Commonly known as:  LYRICA  Take 1 capsule twice a day     sulfamethoxazole-trimethoprim 800-160 MG tablet  Commonly known as:  BACTRIM DS,SEPTRA DS  Take 1 tablet by mouth daily.     traZODone 50 MG tablet  Commonly known as:  DESYREL  Take 3 tablets (150 mg total) by mouth at bedtime.     Vitamin D3 50000 UNITS Caps  Take 50,000 Units by mouth once a week.        Relevant Imaging Results:  Relevant Lab Results:  Recent Labs    Additional Information:  Lives alone at  [facility]. Short term SNF.    Williemae Area, LCSW

## 2015-10-18 NOTE — Clinical Social Work Note (Signed)
Clinical Social Work Assessment  Patient Details  Name: Linda [name] MRN: [id number] Date of Birth: July 12, 1946  Date of referral:  10/18/15               Reason for consult:  Facility Placement (ALF vs SNF)                Permission sought to share information with:  Family Supports, Customer service manager (If necessary to send to SNFs;  Heritage Event organiser) Permission granted to share information::  Yes, Verbal Permission Granted  Name::     [facility], son Leilani Merl SNF's if necessary     Housing/Transportation Living arrangements for the past 2 months:  [place] of Information:  Patient Patient Interpreter Needed:  None Criminal Activity/Legal Involvement Pertinent to Current Situation/Hospitalization:  No - Comment as needed Significant Relationships:  Adult Children Lives with:  Facility Resident Do you feel safe going back to the place where you live?  Yes Need for family participation in patient care:  Yes (Comment) (She is alert and oriented X 4 but agrees to son's interaction with CSW)  Care giving concerns:  "I had too much fluid on me and they made me come to the hospital."  Social Worker assessment / plan:  69 year old female- resident of [facility] where she currently resides in [place].  Patient states that she values her independence and states that she did not realize that she had so much fluid built up.  CSW discussed PT's recommendation for short term PT and discussed SNF placement process. Patient is Observation Status and would not meet Medicare criteria for coverage.  Patient questioned why she could not return to her apartment.  CSW discussed with patient that there were current concerns about her fluid gain, weakness and need for close monitoring of her Heart Failure. She did not refuse SNF but stated she would prefer to return to [facility]- even if she went to ALF level with Home Health.   Patient  gave permission for CSW to discuss with her son Gershon Mussel who helps her with medical decisions. CSW attempted to reach son but had to leave a v-mail for return call. CSW also attempted to reach staff at [facility] to discuss possible admission to ALF and bed availability without success. Will need to reattempt tomorrow.  Fl2 will be initiated but will need to determine actual level of care needs. Should SNF be indicated- patient prefers [place], Scientist, forensic as her first choices, then [facility]. She would have to either be self pay or need Letter of Guarantee.   Employment status:  Retired Forensic scientist:  [employer] PT Recommendations:  [place] / Referral to community resources:  [place], Other (Comment Required) (Would benefit from [facility] if she went to ALF at [employer])  Patient/Family's Response to care:  Patient noted to be engaged and involved in decision making process re: d/c needs. She asked many good questions about benefits of SNF vs ALF with HH.  She states that she is feeling better and is not really sure why she cannot return to her apartment.    Patient/Family's Understanding of and Emotional Response to Diagnosis, Current Treatment, and Prognosis:  Patient noted to have a slight delay her her response time during today's assessment. She does appear to have some understanding of her current medical condition and prognosis but indicated that she did not realize she was having so much fluid  gain.  "My feet no longer looked like feet I was so swollen."    Emotional Assessment Appearance:  Appears stated age Attitude/Demeanor/Rapport:   (Cooperative, rational, friendly) Affect (typically observed):  Pleasant, Quiet, Calm Orientation:  Oriented to Self, Oriented to Place, Oriented to  Time, Oriented to Situation Alcohol / Substance use:  Never Used Psych involvement (Current and /or in the community):  No  (Comment)  Discharge Needs  Concerns to be addressed:  Care Coordination Readmission within the last 30 days:  No Current discharge risk:  None Barriers to Discharge:  Continued Medical Work up   Kendell Bane T, LCSW 10/18/2015, 7:00 PM

## 2015-10-18 NOTE — Evaluation (Addendum)
Occupational Therapy Evaluation Patient Details Name: Linda [name] MRN: [id number] DOB: 05/26/1946 Today's Date: 10/18/2015    History of Present Illness Patient is a  69 yo female who presents with Acute on chronic diastolic CHF, generalized weakness, UTI, DOE/chest pain, chronic pain and history of falls at home (independent living [employer]).   Clinical Impression   This 69 yo female admitted with above presents to acute OT with decreased mobility, decreased balance, increased pain, and slowness of movements/processing all affecting her ability to care for herself at a Mod I level at her independent living apartment. She will benefit from acute OT with follow up OT at SNF to get back to a Mod I level.    Follow Up Recommendations  SNF    Equipment Recommendations   (TBD at next venue)       Precautions / Restrictions Precautions Precautions: Fall Restrictions Weight Bearing Restrictions: No      Mobility Bed Mobility Overal bed mobility: Needs Assistance Bed Mobility: Supine to Sit     Supine to sit: Min assist (for trunk support)        Transfers Overall transfer level: Needs assistance Equipment used: Rolling walker (2 wheeled) Transfers: Sit to/from [employer] Sit to Stand: Min assist Stand pivot transfers: Mod assist (to [facility]) Squat pivot transfers: Min assist (BSC from bed)     General transfer comment: Pt very slow to rise and requiring up to Mod assist for stability in standing    Balance Overall balance assessment: Needs assistance Sitting-balance support: No upper extremity supported;Feet supported Sitting balance-Leahy Scale: Poor Sitting balance - Comments: tendency for posterior lean without initiation to correct Postural control: Posterior lean Standing balance support: Single extremity supported;During functional activity Standing balance-Leahy Scale: Poor                              ADL Overall  ADL's : Needs assistance/impaired Eating/Feeding: Supervision/ safety;Set up;Sitting (increased time)   Grooming: Wash/dry hands;Supervision/safety;Set up;Sitting (increased time)   Upper Body Bathing: Supervision/ safety;Set up;Sitting (increased time)   Lower Body Bathing: Minimal assistance (with min A sit<>stand, mod A to maintain standing with activity, and increased time)   Upper Body Dressing : Minimal assistance;Sitting (increased time)   Lower Body Dressing: Moderate assistance (with min A sit<>stand, mod A to maintain standing with activity, and increased time)   Toilet Transfer: Minimal assistance;BSC;Squat-pivot   Toileting- Clothing Manipulation and Hygiene: Minimal assistance (with min A sit<>stand, mod A to maintain standing with activity, and increased time)         General ADL Comments: Pt reports at home it takes her around 2 hours to get ready each day     Vision Additional Comments: Pt does report intermittent double vision          Pertinent Vitals/Pain Pain Assessment: Faces Faces Pain Scale: Hurts even more Pain Location: low back pain radiating down RLE to back of knee, Bil knee pain with WB'ing Pain Descriptors / Indicators: Grimacing;Aching;Sore (describes her knees as "bone on bone") Pain Intervention(s): Limited activity within patient's tolerance;Monitored during session     Hand Dominance Right   Extremity/Trunk Assessment Upper Extremity Assessment Upper Extremity Assessment: Generalized weakness (pt with Bil UE tremors at baseline per her report, but worse now)   Lower Extremity Assessment Lower Extremity Assessment: Generalized weakness       Communication Communication Communication: No difficulties   Cognition Arousal/Alertness: Awake/alert Behavior During  Therapy: Flat affect Overall Cognitive Status: No family/caregiver present to determine baseline cognitive functioning (very slow to respond, process, and move)                                 Home Living Family/patient expects to be discharged to:: Skilled nursing facility   Available Help at Discharge: Friend(s);Family;Available PRN/intermittently Type of Home: Apartment [employer] Independent Living) Home Access: Level entry Entrance Stairs-Number of Steps:  (to get in the garage)   Home Layout: One level     Bathroom Shower/Tub: Teacher, early years/pre: Handicapped height Bathroom Accessibility: No   Home Equipment: Cane - single point;Shower seat;Grab bars - toilet;Grab bars - tub/shower;Hand held shower head;Walker - 4 wheels;Other (comment) (O2 at 3 liters)   Additional Comments: Uses rollator for ambulation      Prior Functioning/Environment Level of Independence: Independent with assistive device(s)             OT Diagnosis: Generalized weakness   OT Problem List: Decreased strength;Decreased activity tolerance;Impaired balance (sitting and/or standing);Pain;Impaired UE functional use;Decreased knowledge of use of DME or AE   OT Treatment/Interventions: Self-care/ADL training;Patient/family education;Balance training;DME and/or AE instruction;Therapeutic activities    OT Goals(Current goals can be found in the care plan section) Acute Rehab OT Goals Patient Stated Goal: none stated OT Goal Formulation: With patient Time For Goal Achievement: 10/25/15 Potential to Achieve Goals: Good ADL Goals Pt Will Perform Grooming: with min guard assist;standing (2 tasks at sink) Pt Will Perform Upper Body Bathing: with min guard assist;sitting;standing Pt Will Perform Lower Body Bathing: with min guard assist;sit to/from stand Pt Will Perform Upper Body Dressing: with min guard assist;sitting;standing Pt Will Perform Lower Body Dressing: with min guard assist;sit to/from stand Pt Will Transfer to Toilet: with min guard assist;ambulating;grab bars (comfort height toliet) Pt Will Perform Toileting - Clothing Manipulation  and hygiene: with min guard assist;sit to/from stand Additional ADL Goal #1: Pt will be S for in and OOB for basic ADLs  OT Frequency: Min 2X/week   Barriers to D/C: Decreased caregiver support          Co-evaluation PT/OT/SLP Co-Evaluation/Treatment: Yes Reason for Co-Treatment: Complexity of the patient's impairments (multi-system involvement);For patient/therapist safety PT goals addressed during session: Mobility/safety with mobility;Proper use of DME OT goals addressed during session: ADL's and self-care;Strengthening/ROM      End of Session Equipment Utilized During Treatment: Gait belt;Rolling walker Nurse Communication: Mobility status (NT)  Activity Tolerance: Patient limited by fatigue Patient left: in chair;with call bell/phone within reach;with chair alarm set   Time: 0802-0828 OT Time Calculation (min): 26 min Charges:  OT General Charges $OT Visit: 1 Procedure OT Evaluation $Initial OT Evaluation Tier I: 1 Procedure  Almon Register [phone] 10/18/2015, 10:13 AM

## 2015-10-18 NOTE — Evaluation (Signed)
Physical Therapy Evaluation Patient Details Name: Linda [name] MRN: [id number] DOB: 1946/04/22 Today's Date: 10/18/2015   History of Present Illness  Patient is a  69 yo female who presents with Acute on chronic diastolic CHF, generalized weakness, UTI, DOE/chest pain, chronic pain and history of falls at home (independent living). (on home O2)  Clinical Impression  Pt presents with impairments as indicated below.  Pt would benefit from continued skilled acute PT services to increase LE strength and maximize safety and independence with functional mobility.  PT recommends SNF upon discharge as pt is not safe to mobilize without supervision at this time.    Follow Up Recommendations SNF    Equipment Recommendations  None recommended by PT    Recommendations for Other Services       Precautions / Restrictions Precautions Precautions: Fall Restrictions Weight Bearing Restrictions: No      Mobility  Bed Mobility Overal bed mobility: Needs Assistance Bed Mobility: Supine to Sit     Supine to sit: Min assist (for trunk support)        Transfers Overall transfer level: Needs assistance Equipment used: Rolling walker (2 wheeled) Transfers: Sit to/from [facility] Sit to Stand: Min assist Stand pivot transfers: Mod assist (to [facility])       General transfer comment: Pt very slow to rise and requiring up to Mod assist for stability in standing  Ambulation/Gait Ambulation/Gait assistance: Mod assist Ambulation Distance (Feet): 8 Feet Assistive device: Rolling walker (2 wheeled) Gait Pattern/deviations: Step-through pattern;Decreased stride length;Trunk flexed Gait velocity: slow Gait velocity interpretation: <1.8 ft/sec, indicative of risk for recurrent falls General Gait Details: Pt with very slow movements and c/o bilateral knee pain with ambulation. When walking from [facility] to recliner chair pt experienced bilateral knee buckling, requiring Mod Assist to  maintain upright.  No increased SOB and O2 >93% on 3L with ambulation.  Stairs            Wheelchair Mobility    Modified Rankin (Stroke Patients Only)       Balance Overall balance assessment: History of Falls (Mod assist to maintain standing during functional task)                                           Pertinent Vitals/Pain Pain Assessment: Faces Faces Pain Scale: Hurts even more Pain Location: low back pain radiating down R LE too back of knee; bilateral knee pain with WB Pain Descriptors / Indicators: Grimacing;Aching (describes her knees as "bone on bone") Pain Intervention(s): Limited activity within patient's tolerance;Monitored during session    Home Living Family/patient expects to be discharged to:: Private residence ([place])   Available Help at Discharge: Friend(s);Family;Available PRN/intermittently Type of Home: Apartment [employer] Independent Living) Home Access: Level entry   Entrance Stairs-Number of Steps:  (to get in the garage) Home Layout: One level Home Equipment: Cane - single point;Shower seat;Grab bars - toilet;Grab bars - tub/shower;Hand held shower head;Walker - 4 wheels Additional Comments: Uses rollator for ambulation    Prior Function Level of Independence: Independent with assistive device(s)               Hand Dominance   Dominant Hand: Right    Extremity/Trunk Assessment   Upper Extremity Assessment: Defer to OT evaluation           Lower Extremity Assessment: Generalized weakness  Communication   Communication: No difficulties  Cognition Arousal/Alertness: Awake/alert (tired and difficult to wake up; once awake was alert) Behavior During Therapy: Flat affect Overall Cognitive Status: No family/caregiver present to determine baseline cognitive functioning (very slow to process)                      General Comments General comments (skin  integrity, edema, etc.): Pt transferred to [facility] for toileting.  Pt with flat affect and very slow movements as well as slow processing.  She notes that she had double vision when sitting EOB which she states has been the case for a couple weeks now.  Mild lightheadedness with supine to sit which subsided.  BP 102/59.    Exercises        Assessment/Plan    PT Assessment Patient needs continued PT services  PT Diagnosis Difficulty walking;Generalized weakness   PT Problem List Decreased strength;Decreased activity tolerance;Decreased balance;Decreased mobility  PT Treatment Interventions DME instruction;Gait training;Functional mobility training;Therapeutic activities;Therapeutic exercise;Balance training;Patient/family education   PT Goals (Current goals can be found in the Care Plan section) Acute Rehab PT Goals Patient Stated Goal: none stated PT Goal Formulation: With patient Time For Goal Achievement: 11/01/15 Potential to Achieve Goals: Fair    Frequency Min 2X/week   Barriers to discharge Decreased caregiver support (independent living)      Co-evaluation PT/OT/SLP Co-Evaluation/Treatment: Yes Reason for Co-Treatment: Complexity of the patient's impairments (multi-system involvement) PT goals addressed during session: Mobility/safety with mobility;Proper use of DME         End of Session Equipment Utilized During Treatment: Gait belt;Oxygen (3L) Activity Tolerance: Patient limited by fatigue Patient left: in chair;with call bell/phone within reach;with chair alarm set;with nursing/sitter in room Nurse Communication: Mobility status    Functional Assessment Tool Used: clinical judgement Functional Limitation: Mobility: Walking and moving around Mobility: Walking and Moving Around Current Status (T6606): At least 20 percent but less than 40 percent impaired, limited or restricted Mobility: Walking and Moving Around Goal Status [id number]): At least 1 percent but less than 20  percent impaired, limited or restricted    Time: 0802-0833 PT Time Calculation (min) (ACUTE ONLY): 31 min   Charges:   PT Evaluation $Initial PT Evaluation Tier I: 1 Procedure     PT G Codes:   PT G-Codes **NOT FOR INPATIENT CLASS** Functional Assessment Tool Used: clinical judgement Functional Limitation: Mobility: Walking and moving around Mobility: Walking and Moving Around Current Status (X7741): At least 20 percent but less than 40 percent impaired, limited or restricted Mobility: Walking and Moving Around Goal Status [phone]): At least 1 percent but less than 20 percent impaired, limited or restricted    Lorita Officer 10/18/2015, 9:00 AM Lorita Officer, SPT

## 2015-10-18 NOTE — Progress Notes (Signed)
TELEMETRY: Reviewed telemetry pt in NSR: Filed Vitals:   10/17/15 1252 10/17/15 2144 10/17/15 2155 10/18/15 0541  BP: 118/66  102/58 106/49  Pulse: 70  81 71  Temp: 97 F (36.1 C)  98 F (36.7 C) 98.5 F (36.9 C)  TempSrc: Oral  Oral Oral  Resp: 18  18 19   Height:      Weight:    75.9 kg (167 lb 5.3 oz)  SpO2: 98% 94% 93% 100%    Intake/Output Summary (Last 24 hours) at 10/18/15 0841 Last data filed at 10/18/15 0600  Gross per 24 hour  Intake    685 ml  Output   1300 ml  Net   -615 ml   Filed Weights   10/16/15 1259 10/17/15 0642 10/18/15 0541  Weight: 77.792 kg (171 lb 8 oz) 76.132 kg (167 lb 13.4 oz) 75.9 kg (167 lb 5.3 oz)    Subjective Feels OK. Has chronic back pain and "bone on bone" knee pain. Reports taking Percocet twice a day at home but wants it more frequently here. Was also taking Xanax 1 mg tid for panic attacks. Very weak.  [name] Kitchen ALPRAZolam  1 mg Oral TID  . ARIPiprazole  5 mg Oral Daily  . aspirin EC  81 mg Oral Daily  . atorvastatin  20 mg Oral q1800  . budesonide-formoterol  2 puff Inhalation BID  . diclofenac sodium  4 g Topical TID  . DULoxetine  90 mg Oral Daily  . folic acid  1 mg Oral Daily  . furosemide  40 mg Oral Daily  . heparin  5,000 Units Subcutaneous 3 times per day  . isosorbide mononitrate  30 mg Oral Daily  . nebivolol  5 mg Oral Daily  . pantoprazole  40 mg Oral Daily  . potassium chloride  40 mEq Oral BID  . pregabalin  300 mg Oral BID  . sodium chloride  3 mL Intravenous Q12H      LABS: Basic Metabolic Panel:  Recent Labs  10/17/15 0558 10/18/15 0217  NA 140 140  K 4.0 4.1  CL 96* 96*  CO2 34* 30  GLUCOSE 116* 95  BUN 9 14  CREATININE 0.95 1.58*  CALCIUM 9.2 9.4  MG 1.9  --    Liver Function Tests:  Recent Labs  10/16/15 1730  AST 26  ALT 15  ALKPHOS 84  BILITOT 0.9  PROT 7.1  ALBUMIN 3.8   No results for input(s): LIPASE, AMYLASE in the last 72 hours. CBC:  Recent Labs  10/16/15 1500  WBC 8.0    NEUTROABS 5.1  HGB 12.9  HCT 38.5  MCV 88.3  PLT 200   Cardiac Enzymes:  Recent Labs  10/16/15 1730 10/16/15 2350 10/17/15 0558  TROPONINI <0.03 <0.03 <0.03   BNP: No results for input(s): PROBNP in the last 72 hours. D-Dimer:  Recent Labs  10/16/15 1730  DDIMER 2.68*   Hemoglobin A1C: No results for input(s): HGBA1C in the last 72 hours. Fasting Lipid Panel: No results for input(s): CHOL, HDL, LDLCALC, TRIG, CHOLHDL, LDLDIRECT in the last 72 hours. Thyroid Function Tests:  Recent Labs  10/17/15 0558  TSH 0.508     Radiology/Studies:  Dg Chest 2 View  10/17/2015  CLINICAL DATA:  Dyspnea EXAM: CHEST - 2 VIEW COMPARISON:  10/13/2015 FINDINGS: Cardiac shadow is at the upper limits of normal but stable. Aortic calcifications are seen. Multiple calcified pleural plaques are again noted. No focal confluent infiltrate is seen. No sizable effusion  is noted. No bony abnormality is seen. IMPRESSION: Chronic calcified pleural plaques.  No acute abnormality noted. Electronically Signed   By: Inez Catalina M.D.   On: 10/17/2015 10:36   Ct Angio Chest Pe W/cm &/or Wo Cm  10/17/2015  CLINICAL DATA:  Chest tightness, shortness of breath, dyspnea EXAM: CT ANGIOGRAPHY CHEST WITH CONTRAST TECHNIQUE: Multidetector CT imaging of the chest was performed using the standard protocol during bolus administration of intravenous contrast. Multiplanar CT image reconstructions and MIPs were obtained to evaluate the vascular anatomy. CONTRAST:  37mL OMNIPAQUE IOHEXOL 350 MG/ML SOLN COMPARISON:  09/15/2014 FINDINGS: Right arm IV contrast injection. SVC patent. RV is nondilated. Satisfactory opacification of pulmonary arteries noted, and there is no evidence of pulmonary emboli. Patent bilateral pulmonary veins. Moderate coronary calcifications. Adequate contrast opacification of the thoracic aorta with no evidence of dissection, aneurysm, or stenosis. There is classic 3-vessel brachiocephalic arch  anatomy without proximal stenosis. Scattered plaque in the aortic arch. No pleural or pericardial effusion. Bilateral partially calcified pleural plaques. No hilar or mediastinal adenopathy. Linear scarring or subsegmental atelectasis posteriorly in both lower lobes. No confluent airspace disease or overt edema. Visualized portions of upper abdomen unremarkable. Review of the MIP images confirms the above findings. IMPRESSION: 1. Negative for acute PE or thoracic aortic dissection. 2. Atherosclerosis, including coronary artery disease. Please note that although the presence of coronary artery calcium documents the presence of coronary artery disease, the severity of this disease and any potential stenosis cannot be assessed on this non-gated CT examination. Assessment for potential risk factor modification, dietary therapy or pharmacologic therapy may be warranted, if clinically indicated. 3. Bilateral calcified pleural plaques as before. Electronically Signed   By: Lucrezia Europe M.D.   On: 10/17/2015 15:07    PHYSICAL EXAM General: Well developed, well nourished, in no acute distress. Head: Normocephalic, atraumatic, sclera non-icteric, oropharynx is clear Neck: Negative for carotid bruits. JVD not elevated. No adenopathy Lungs: Clear bilaterally to auscultation without wheezes, rales, or rhonchi. Breathing is unlabored. Heart: RRR S1 S2 without murmurs, rubs, or gallops.  Abdomen: Soft, non-tender, non-distended with normoactive bowel sounds. No hepatomegaly. No rebound/guarding. No obvious abdominal masses. Msk:  Strength and tone appears normal for age. Extremities: 1+ edema.  Distal pedal pulses are 2+ and equal bilaterally. Neuro: Alert and oriented X 3. Moves all extremities spontaneously. Psych:  Responds to questions appropriately with a flat affect. Appears sedated.   ASSESSMENT AND PLAN: 1. Acute on chronic diastolic CHF. Diuresed well with IV lasix. Edema resolved. Lungs clear. Continue  diuresis with potassium supplement. I/O negative 1.5 liters since admit. Weight down 4 lbs. Will switch lasix to po 40 mg daily.  Daily BMET. 2. Dyspnea/ chest pain. Probably due to #1. No evidence of MI. CT negative for PE. On DVT prophylaxis.  3. UTI. UA and culture are negative. Will DC Bactrim. 4. Chronic pain. Was oversedated on admission with lethargy. Fentanyl patch discontinued. Use percocet PRN 5/325 mg q 8 hr. Was on 10/325 mg. Will resume Xanax 1 mg tid for panic attacks and observe for any deterioration of mental status.  5. CAD 6. Weakness/fatigue. PT/OT assessment in progress. Initial PT assessment suggests she will need more intensive PT than can be provided in independent living environment. Will ask case management and social work look into options.  7. Acute renal failure. Creatinine increased to 1.58. Probably related to IV diuresis and initiation of ACEi. Will hold ACEi and reduce lasix as noted. Repeat BMET in am.  Present on Admission:  . Diastolic heart failure ([place]) . CAD (coronary artery disease) . Hyperlipidemia . Chronic pain syndrome . Generalized anxiety disorder--with occasional panic attacks.  [name] Kitchen History of pulmonary embolism . Acute on chronic diastolic CHF (congestive heart failure), NYHA class 1 ([place])  Signed, Grazia Taffe [place], [place] 10/18/2015 8:41 AM

## 2015-10-19 ENCOUNTER — Ambulatory Visit: Payer: Medicare Other | Admitting: Family Medicine

## 2015-10-19 ENCOUNTER — Encounter (HOSPITAL_COMMUNITY): Payer: Self-pay | Admitting: General Practice

## 2015-10-19 DIAGNOSIS — E785 Hyperlipidemia, unspecified: Secondary | ICD-10-CM | POA: Diagnosis not present

## 2015-10-19 DIAGNOSIS — F411 Generalized anxiety disorder: Secondary | ICD-10-CM | POA: Diagnosis not present

## 2015-10-19 DIAGNOSIS — R4182 Altered mental status, unspecified: Secondary | ICD-10-CM | POA: Diagnosis not present

## 2015-10-19 DIAGNOSIS — I5033 Acute on chronic diastolic (congestive) heart failure: Secondary | ICD-10-CM | POA: Diagnosis not present

## 2015-10-19 DIAGNOSIS — I2510 Atherosclerotic heart disease of native coronary artery without angina pectoris: Secondary | ICD-10-CM | POA: Diagnosis not present

## 2015-10-19 DIAGNOSIS — G894 Chronic pain syndrome: Secondary | ICD-10-CM | POA: Diagnosis not present

## 2015-10-19 LAB — BASIC METABOLIC PANEL
Anion gap: 11 (ref 5–15)
BUN: 22 mg/dL — ABNORMAL HIGH (ref 6–20)
CO2: 31 mmol/L (ref 22–32)
Calcium: 9.4 mg/dL (ref 8.9–10.3)
Chloride: 96 mmol/L — ABNORMAL LOW (ref 101–111)
Creatinine, Ser: 1.55 mg/dL — ABNORMAL HIGH (ref 0.44–1.00)
GFR calc Af Amer: 38 mL/min — ABNORMAL LOW (ref >60)
GFR calc non Af Amer: 33 mL/min — ABNORMAL LOW (ref >60)
Glucose, Bld: 113 mg/dL — ABNORMAL HIGH (ref 65–99)
Potassium: 5 mmol/L (ref 3.5–5.1)
Sodium: 138 mmol/L (ref 135–145)

## 2015-10-19 MED ORDER — POTASSIUM CHLORIDE CRYS ER 20 MEQ PO TBCR
40.0000 meq | EXTENDED_RELEASE_TABLET | Freq: Every day | ORAL | Status: DC
  Administered 2015-10-20: 40 meq via ORAL
  Filled 2015-10-19: qty 2

## 2015-10-19 MED ORDER — DOCUSATE SODIUM 100 MG PO CAPS
100.0000 mg | ORAL_CAPSULE | Freq: Once | ORAL | Status: AC
  Administered 2015-10-19: 100 mg via ORAL
  Filled 2015-10-19: qty 1

## 2015-10-19 NOTE — Progress Notes (Signed)
Patient Name: Linda [name] Date of Encounter: 10/19/2015  Primary Cardiologist: Dr. Stanford Breed   Principal Problem:   Acute on chronic diastolic CHF (congestive heart failure), NYHA class 1 ([place]) Active Problems:   History of pulmonary embolism   Generalized anxiety disorder--with occasional panic attacks.    Chronic pain syndrome   Hyperlipidemia   CAD (coronary artery disease)   Diastolic heart failure (HCC)    SUBJECTIVE  Has intermittent SOB and chest pressure overnight.  CURRENT MEDS . ALPRAZolam  1 mg Oral TID  . ARIPiprazole  5 mg Oral Daily  . aspirin EC  81 mg Oral Daily  . atorvastatin  20 mg Oral q1800  . budesonide-formoterol  2 puff Inhalation BID  . diclofenac sodium  4 g Topical TID  . docusate sodium  100 mg Oral Once  . DULoxetine  90 mg Oral Daily  . folic acid  1 mg Oral Daily  . furosemide  40 mg Oral Daily  . heparin  5,000 Units Subcutaneous 3 times per day  . isosorbide mononitrate  30 mg Oral Daily  . nebivolol  5 mg Oral Daily  . pantoprazole  40 mg Oral Daily  . [START ON 10/20/2015] potassium chloride  40 mEq Oral Daily  . pregabalin  300 mg Oral BID  . sodium chloride  3 mL Intravenous Q12H    OBJECTIVE  Filed Vitals:   10/18/15 2100 10/19/15 0417 10/19/15 0729 10/19/15 1209  BP:  116/74  119/59  Pulse:  70  73  Temp:  97.6 F (36.4 C)  97.5 F (36.4 C)  TempSrc:  Oral  Oral  Resp:  18  18  Height:      Weight:  168 lb 9.6 oz (76.476 kg)    SpO2: 96% 96% 95% 97%    Intake/Output Summary (Last 24 hours) at 10/19/15 1321 Last data filed at 10/19/15 1133  Gross per 24 hour  Intake    660 ml  Output    600 ml  Net     60 ml   Filed Weights   10/17/15 0642 10/18/15 0541 10/19/15 0417  Weight: 167 lb 13.4 oz (76.132 kg) 167 lb 5.3 oz (75.9 kg) 168 lb 9.6 oz (76.476 kg)    PHYSICAL EXAM  General: Pleasant, NAD. Frail. Neuro: Alert and oriented X 3. Moves all extremities spontaneously. Psych: Normal affect. HEENT:   Normal  Neck: Supple without bruits or JVD. Lungs:  Resp regular and unlabored, CTA. Heart: RRR no s3, s4, or murmurs. Abdomen: Soft, non-tender, non-distended, BS + x 4.  Extremities: No clubbing, cyanosis or edema. DP/PT/Radials 2+ and equal bilaterally. Deformities noted in finger, likely OA  Accessory Clinical Findings  CBC  Recent Labs  10/16/15 1500  WBC 8.0  NEUTROABS 5.1  HGB 12.9  HCT 38.5  MCV 88.3  PLT 202   Basic Metabolic Panel  Recent Labs  10/17/15 0558 10/18/15 0217 10/19/15 0315  NA 140 140 138  K 4.0 4.1 5.0  CL 96* 96* 96*  CO2 34* 30 31  GLUCOSE 116* 95 113*  BUN 9 14 22*  CREATININE 0.95 1.58* 1.55*  CALCIUM 9.2 9.4 9.4  MG 1.9  --   --    Liver Function Tests  Recent Labs  10/16/15 1730  AST 26  ALT 15  ALKPHOS 84  BILITOT 0.9  PROT 7.1  ALBUMIN 3.8   No results for input(s): LIPASE, AMYLASE in the last 72 hours. Cardiac Enzymes  Recent Labs  10/16/15 1730 10/16/15 2350 10/17/15 0558  TROPONINI <0.03 <0.03 <0.03   BNP Invalid input(s): POCBNP D-Dimer  Recent Labs  10/16/15 1730  DDIMER 2.68*   Thyroid Function Tests  Recent Labs  10/17/15 0558  TSH 0.508    TELE NSR without significant ventricular ectopy    ECG  No new EKG  Echocardiogram 08/24/2015  LV EF: 60% -  65%  ------------------------------------------------------------------- Indications:   Chest Pain (R07.9).  ------------------------------------------------------------------- History:  PMH: Right Bundle Branch Block, Asthma, Obstructive Sleep Apnea with CPAP. Syncope and dyspnea. Congestive heart failure. Chronic obstructive pulmonary disease. Risk factors: Family history of coronary artery disease. Hypertension.  ------------------------------------------------------------------- Study Conclusions  - Left ventricle: The cavity size was normal. Systolic function was normal. The estimated ejection fraction was in the range  of 60% to 65%. Wall motion was normal; there were no regional wall motion abnormalities. There was an increased relative contribution of atrial contraction to ventricular filling. Doppler parameters are consistent with abnormal left ventricular relaxation (grade 1 diastolic dysfunction). - Tricuspid valve: There was trivial regurgitation.    Radiology/Studies  Dg Chest 2 View  10/17/2015  CLINICAL DATA:  Dyspnea EXAM: CHEST - 2 VIEW COMPARISON:  10/13/2015 FINDINGS: Cardiac shadow is at the upper limits of normal but stable. Aortic calcifications are seen. Multiple calcified pleural plaques are again noted. No focal confluent infiltrate is seen. No sizable effusion is noted. No bony abnormality is seen. IMPRESSION: Chronic calcified pleural plaques.  No acute abnormality noted. Electronically Signed   By: Inez Catalina M.D.   On: 10/17/2015 10:36   Ct Angio Chest Pe W/cm &/or Wo Cm  10/17/2015  CLINICAL DATA:  Chest tightness, shortness of breath, dyspnea EXAM: CT ANGIOGRAPHY CHEST WITH CONTRAST TECHNIQUE: Multidetector CT imaging of the chest was performed using the standard protocol during bolus administration of intravenous contrast. Multiplanar CT image reconstructions and MIPs were obtained to evaluate the vascular anatomy. CONTRAST:  36mL OMNIPAQUE IOHEXOL 350 MG/ML SOLN COMPARISON:  09/15/2014 FINDINGS: Right arm IV contrast injection. SVC patent. RV is nondilated. Satisfactory opacification of pulmonary arteries noted, and there is no evidence of pulmonary emboli. Patent bilateral pulmonary veins. Moderate coronary calcifications. Adequate contrast opacification of the thoracic aorta with no evidence of dissection, aneurysm, or stenosis. There is classic 3-vessel brachiocephalic arch anatomy without proximal stenosis. Scattered plaque in the aortic arch. No pleural or pericardial effusion. Bilateral partially calcified pleural plaques. No hilar or mediastinal adenopathy. Linear  scarring or subsegmental atelectasis posteriorly in both lower lobes. No confluent airspace disease or overt edema. Visualized portions of upper abdomen unremarkable. Review of the MIP images confirms the above findings. IMPRESSION: 1. Negative for acute PE or thoracic aortic dissection. 2. Atherosclerosis, including coronary artery disease. Please note that although the presence of coronary artery calcium documents the presence of coronary artery disease, the severity of this disease and any potential stenosis cannot be assessed on this non-gated CT examination. Assessment for potential risk factor modification, dietary therapy or pharmacologic therapy may be warranted, if clinically indicated. 3. Bilateral calcified pleural plaques as before. Electronically Signed   By: Lucrezia Europe M.D.   On: 10/17/2015 15:07    ASSESSMENT AND PLAN  1. Acute on chronic diastolic HF   - euvolemic on exam. Continue 40mg  daily PO lasix.   - continue ASA, bystolic, imdur, lasix, lipitor  - discussed importance of Na restriction, fluid restriction and if possible daily weight  - Hold ACEI/ARB. Decrease KCl to 23meq daily  - likely discharge  later today or tomorrow if stable to SNF (per OT/PT recommendation)  - add colace for constipation, no BM since last Sat  2. Dyspnea and chest pain  - positive d-dimer, negative CTA of chest for PE   3. AMS: over sedated on admission with narcotic, fentanyl patch discontinued. Percocet and Xanax PRN  4. UTI  5. AKI: Cr 0.9. Trended up to 1.5 yesterday likely related to IV diuresis, Cr begining to trend down today   Signed, Woodward Ku Pager: [phone] Patient seen and examined and history reviewed. Agree with above findings and plan. Still weak. No significant SOB. Volume status looks very good. Mental status is improved with decreased pain meds. OT/PT recommend SNF. From a medical standpoint I think she is ready for DC. I discussed at length with the patient and her son.  FL-2 signed in Epic. Social work on [employer]. Will plan DC once bed available.   Linda [name], [place] 10/19/2015 1:33 PM

## 2015-10-19 NOTE — Progress Notes (Addendum)
5:15pm  CSW received call back from Trainer at [facility]- they did speak with son- the patients daughter is coming from [place] and will arrive on 10/28- she will be the patients 24hour caregiver alongside the patients normal caregiver and additional home health services.  [facility] with [facility] tomorrow- MD will need to put home health PT/OT/RN on DC summary so that facility can order from their home health provider  4:30pm CSW spoke with pt this morning concerning plan for return to independent living vs SNF.  CSW spoke with [facility] to see if pt could transition to ALF or get increased support in Ind. Living if pt decided to go home- they can offer increased support in Independent- pt already gets 4 hours caregiver a day and could possible get more caregiver hours alongside home health PT/OT/RN.   CSW informed pt that SNF would not be covered by insurance because patient does not have a 3 night inpatient stay for Medicare- pt does not feel as if she could pay privately for Skilled care (approx $250-300/day).  [facility] informed [facility] would work towards increasing support- pt wants son to help with decision making and asked that CSW speak with him.  CSW spoke with pt son to inform that insurance wouldn't pay for SNF- son is speaking with pt Independent living as well about options and states he knows people at [facility] and trying to make plan with them. Pt son was not happy when informed of potential DC today- states pt was DCd too soon last time from the hospital.  CSW got 7 day LOG approved for skilled stay- [facility] can offer a LOG bed for patient- CSW discussed this option with the patient but she only wanted to go to certain facilities her son was aware of- wants CSW to defer to son.  CSW called pt son several times since 12pm to discuss- left messages  CSW called [facility] and left numerous messages to discuss  throughout the day  No disposition at this time- patient has a bed at [facility] but will not go without son's approval- pt reported that son was working with facility to increase support at [facility] but CSW was unable to confirm with son or with facility- facility coordinators left facility at [place]- no way for pt to go back to [facility] today  [place] will continue to follow  Domenica Reamer, [place] Worker [phone]

## 2015-10-19 NOTE — NC FL2 (Signed)
[place] LEVEL OF CARE SCREENING TOOL     IDENTIFICATION  Patient Name: Linda [name] Birthdate: [birth year] Sex: female Admission Date (Current Location): 10/16/2015  [facility] and [place] Number: Herbalist and Address:  [facility] [place] [address] [place], [place] [address]      Provider Number: [id number]  Attending Physician Name and Address:  Lelon Perla, MD  Relative Name and Phone Number:       Current Level of Care: Hospital Recommended Level of Care: [place] Prior Approval Number:    Date Approved/Denied:   PASRR Number:    Discharge Plan: Other (Comment) (SNF vs ALF with [facility] )    Current Diagnoses: Patient Active Problem List   Diagnosis Date Noted  . Acute on chronic diastolic CHF (congestive heart failure), NYHA class 1 ([place]) 10/17/2015  . Diastolic heart failure ([place]) 10/16/2015  . CAD (coronary artery disease) 08/16/2015  . Chest pain 08/16/2015  . Narrowing of intervertebral disc space 07/17/2015  . Bilateral lower leg pain 01/24/2015  . 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 Chronic pain syndrome 11/21/2014  . Anxiety and depression 11/21/2014  . Abnormal grief reaction 11/21/2014  . Severe persistent asthma 11/21/2014  . Hypokalemia 11/21/2014  . Hyperlipidemia 11/21/2014  . Osteoarthritis of right knee 10/18/2014  . Pernicious anemia 08/24/2014  . Generalized anxiety disorder--with occasional panic attacks.  08/05/2014  . Recurrent major depression-severe ([place]) 08/05/2014  . Multifactorial gait disorder 07/26/2014  . Chronic respiratory failure ([place]) 07/23/2014  . Epistaxis 07/18/2014  . Acute GI bleeding 07/17/2014  . Anemia associated with acute blood loss 07/17/2014  . Syncope 07/17/2014  . History of pulmonary embolism 07/17/2014  . GI bleed 07/17/2014  . Arthritis 05/11/2014  . DDD (degenerative disc disease) 05/11/2014  . Fibrositis 05/11/2014  .  Amianthosis ([place]) 05/11/2014  . Grief 04/28/2014  . OSA (obstructive sleep apnea) 04/24/2014  . Chronic diastolic CHF, NYHA class 1 04/24/2014  . Acute pulmonary embolism ([place]) 08/13/2013  . Pleural plaque with presence of asbestos 07/22/2013  . Polypharmacy 04/26/2013  . Fibromyalgia syndrome 03/01/2013  . Insomnia 11/12/2012  . HTN (hypertension), benign 10/25/2012    Orientation ACTIVITIES/SOCIAL BLADDER RESPIRATION    Self, Time, Situation, Place  Active Incontinent O2 (As needed) (3 liters per min on 02 at facility)  BEHAVIORAL SYMPTOMS/MOOD NEUROLOGICAL BOWEL NUTRITION STATUS      Continent Diet (2 gm sodium)  PHYSICIAN VISITS COMMUNICATION OF NEEDS Height & Weight Skin  60 days Verbally 5\' 4"  (162.6 cm) 167 lbs. Normal          AMBULATORY STATUS RESPIRATION    Supervision limited O2 (As needed) (3 liters per min on 02 at facility)      [place], Dressing ([place]) Bathing Assistance: Limited assistance   Dressing Assistance: Limited assistance      Functional Limitations [place]  PT (By licensed PT), OT (By licensed OT)     PT Frequency: 5 OT Frequency: 5           Additional Factors Info  Code Status Code Status Info: Full             Current Medications (10/19/2015): Current Facility-Administered Medications  Medication Dose Route Frequency Provider Last Rate Last Dose  . 0.9 %  sodium chloride infusion  250 mL Intravenous PRN Burtis Junes, NP      . acetaminophen (TYLENOL) tablet 650 mg  650 mg Oral Q4H PRN Burtis Junes, NP   650 mg at 10/17/15 7829  . albuterol (PROVENTIL) (2.5 MG/3ML) 0.083% nebulizer solution 2.5 mg  2.5 mg Inhalation Q6H PRN Burtis Junes, NP      . ALPRAZolam Duanne Moron) tablet 1 mg  1 mg Oral TID Joellyn Grandt M [place], MD   1 mg at 10/19/15 1024  . ARIPiprazole (ABILIFY) tablet 5 mg  5 mg Oral Daily Burtis Junes, NP   5 mg  at 10/19/15 1025  . aspirin EC tablet 81 mg  81 mg Oral Daily Burtis Junes, NP   81 mg at 10/19/15 1023  . atorvastatin (LIPITOR) tablet 20 mg  20 mg Oral q1800 Burtis Junes, NP   20 mg at 10/18/15 1645  . budesonide-formoterol (SYMBICORT) 160-4.5 MCG/ACT inhaler 2 puff  2 puff Inhalation BID Burtis Junes, NP   2 puff at 10/19/15 0729  . diclofenac sodium (VOLTAREN) 1 % transdermal gel 4 g  4 g Topical TID Burtis Junes, NP   4 g at 10/19/15 1025  . DULoxetine (CYMBALTA) DR capsule 90 mg  90 mg Oral Daily Lelon Perla, MD   90 mg at 10/19/15 1023  . folic acid (FOLVITE) tablet 1 mg  1 mg Oral Daily Burtis Junes, NP   1 mg at 10/19/15 1024  . furosemide (LASIX) tablet 40 mg  40 mg Oral Daily Sholom Dulude M [place], MD   40 mg at 10/19/15 1024  . heparin injection 5,000 Units  5,000 Units Subcutaneous 3 times per day Burtis Junes, NP   5,000 Units at 10/19/15 [phone]  . isosorbide mononitrate (IMDUR) 24 hr tablet 30 mg  30 mg Oral Daily Burtis Junes, NP   30 mg at 10/19/15 1024  . nebivolol (BYSTOLIC) tablet 5 mg  5 mg Oral Daily Burtis Junes, NP   5 mg at 10/19/15 1025  . ondansetron (ZOFRAN) injection 4 mg  4 mg Intravenous Q6H PRN Burtis Junes, NP   4 mg at 10/17/15 1029  . oxyCODONE-acetaminophen (PERCOCET/ROXICET) 5-325 MG per tablet 1 tablet  1 tablet Oral Q8H PRN Macklyn Glandon M [place], MD   1 tablet at 10/19/15 [phone]  . pantoprazole (PROTONIX) EC tablet 40 mg  40 mg Oral Daily Burtis Junes, NP   40 mg at 10/19/15 1024  . potassium chloride SA (K-DUR,KLOR-CON) CR tablet 40 mEq  40 mEq Oral BID Burtis Junes, NP   40 mEq at 10/19/15 1024  . pregabalin (LYRICA) capsule 300 mg  300 mg Oral BID Burtis Junes, NP   300 mg at 10/19/15 1024  . sodium chloride 0.9 % injection 3 mL  3 mL Intravenous Q12H Burtis Junes, NP   3 mL at 10/19/15 1025  . sodium chloride 0.9 % injection 3 mL  3 mL Intravenous PRN Burtis Junes, NP      . traZODone (DESYREL) tablet 150 mg  150 mg Oral QHS  PRN Burtis Junes, NP   150 mg at 10/18/15 2206   Facility-Administered Medications Ordered in Other Encounters  Medication Dose Route Frequency Provider Last Rate Last Dose  . ferumoxytol (FERAHEME) 510 mg in sodium chloride 0.9 % 100 mL IVPB  510 mg Intravenous Once Volanda Napoleon, MD       Do not use this list as  official medication orders. Please verify with discharge summary.  Discharge Medications:   Medication List    ASK your doctor about these medications        albuterol 108 (90 BASE) MCG/ACT inhaler  Commonly known as:  PROVENTIL HFA;VENTOLIN HFA  Inhale 2 puffs into the lungs every 6 (six) hours as needed for wheezing or shortness of breath.     albuterol (2.5 MG/3ML) 0.083% nebulizer solution  Commonly known as:  PROVENTIL  Take 3 mLs (2.5 mg total) by nebulization every 6 (six) hours as needed for wheezing or shortness of breath.     ALPRAZolam 1 MG tablet  Commonly known as:  XANAX  TAKE 1 TABLET 3 TIMES A DAY.     ARIPiprazole 5 MG tablet  Commonly known as:  ABILIFY  Take 1 tablet (5 mg total) by mouth daily.     atorvastatin 20 MG tablet  Commonly known as:  LIPITOR  Take 1 tablet (20 mg total) by mouth daily at 6 PM.     budesonide-formoterol 160-4.5 MCG/ACT inhaler  Commonly known as:  SYMBICORT  Inhale 2 puffs into the lungs 2 (two) times daily.     cephALEXin 500 MG capsule  Commonly known as:  KEFLEX  Take 1 capsule (500 mg total) by mouth 4 (four) times daily.     diclofenac sodium 1 % Gel  Commonly known as:  VOLTAREN  Apply 4 g topically 3 (three) times daily. TO BOTH KNEES     DULoxetine 30 MG capsule  Commonly known as:  CYMBALTA  1 cap po qd to be combined with a 60mg  duloxetine cap     DULoxetine 60 MG capsule  Commonly known as:  CYMBALTA  1 cap po qd to be combined with a 30 mg duloxetine cap daily     fentaNYL 12 MCG/HR  Commonly known as:  DURAGESIC - dosed mcg/hr  Place 1 patch (12.5 mcg total) onto the skin every 3 (three)  days.     folic acid 1 MG tablet  Commonly known as:  FOLVITE  Take 2 tablets (2 mg total) by mouth daily.     furosemide 20 MG tablet  Commonly known as:  LASIX  Take 1 tablet (20 mg total) by mouth daily.     isosorbide mononitrate 30 MG 24 hr tablet  Commonly known as:  IMDUR  Take 1 tablet (30 mg total) by mouth daily.     multivitamin with minerals Tabs tablet  Take 1 tablet by mouth daily.     nebivolol 5 MG tablet  Commonly known as:  BYSTOLIC  Take 1 tablet (5 mg total) by mouth daily.     nitroGLYCERIN 0.4 MG SL tablet  Commonly known as:  NITROSTAT  Place 0.4 mg under the tongue every 5 (five) minutes as needed for chest pain.     ondansetron 4 MG tablet  Commonly known as:  ZOFRAN  Take 1 tablet (4 mg total) by mouth every 6 (six) hours.     OSTEO BI-FLEX ADV JOINT SHIELD Tabs  Take 1 tablet by mouth daily.     oxyCODONE-acetaminophen 10-325 MG tablet  Commonly known as:  PERCOCET  Take 1 tablet by mouth every 6 (six) hours as needed for pain. ONE MONTH SUPPLY     OXYGEN  Inhale 3 L into the lungs continuous.     pantoprazole 40 MG tablet  Commonly known as:  PROTONIX  Take 1 tablet (40 mg total) by mouth daily.  polyethylene glycol powder powder  Commonly known as:  GLYCOLAX/MIRALAX  Take 17 g by mouth daily.     potassium chloride SA 20 MEQ tablet  Commonly known as:  K-DUR,KLOR-CON  Take 1 tablet (20 mEq total) by mouth daily.     pregabalin 300 MG capsule  Commonly known as:  LYRICA  Take 1 capsule twice a day     sulfamethoxazole-trimethoprim 800-160 MG tablet  Commonly known as:  BACTRIM DS,SEPTRA DS  Take 1 tablet by mouth daily.     traZODone 50 MG tablet  Commonly known as:  DESYREL  Take 3 tablets (150 mg total) by mouth at bedtime.     Vitamin D3 50000 UNITS Caps  Take 50,000 Units by mouth once a week.        Relevant Imaging Results:  Relevant Lab Results:  Recent Labs    Additional Information    Cranford Mon, LCSW  Patient seen and examined and history reviewed. Agree with above findings and plan.  Kindal Ponti [place], [place] 10/19/2015 1:12 PM

## 2015-10-20 DIAGNOSIS — E785 Hyperlipidemia, unspecified: Secondary | ICD-10-CM | POA: Diagnosis not present

## 2015-10-20 DIAGNOSIS — R4182 Altered mental status, unspecified: Secondary | ICD-10-CM | POA: Diagnosis not present

## 2015-10-20 DIAGNOSIS — G894 Chronic pain syndrome: Secondary | ICD-10-CM | POA: Diagnosis not present

## 2015-10-20 DIAGNOSIS — I5033 Acute on chronic diastolic (congestive) heart failure: Secondary | ICD-10-CM | POA: Diagnosis not present

## 2015-10-20 DIAGNOSIS — F411 Generalized anxiety disorder: Secondary | ICD-10-CM | POA: Diagnosis not present

## 2015-10-20 DIAGNOSIS — I2510 Atherosclerotic heart disease of native coronary artery without angina pectoris: Secondary | ICD-10-CM | POA: Diagnosis not present

## 2015-10-20 MED ORDER — ASPIRIN 81 MG PO TBEC: 81.0000 mg | DELAYED_RELEASE_TABLET | Freq: Every day | ORAL | Status: DC

## 2015-10-20 MED ORDER — POTASSIUM CHLORIDE CRYS ER 20 MEQ PO TBCR: 40.0000 meq | EXTENDED_RELEASE_TABLET | Freq: Every day | ORAL | Status: DC

## 2015-10-20 MED ORDER — FUROSEMIDE 40 MG PO TABS: 40.0000 mg | ORAL_TABLET | Freq: Every day | ORAL | Status: DC

## 2015-10-20 NOTE — Progress Notes (Signed)
CSW faxed MD orders to facility so they can set up home health.  Pt son will be at hospital between 3-4pm to pick up pt and take back to [facility].  CSW signing off  Domenica Reamer, [place] Social Worker [phone]

## 2015-10-20 NOTE — Progress Notes (Signed)
69 y.o F who was admitted from [facility] Independent Living 10/16/2015 will return today with increased hours of care and 24/7 assistance of her daughter who is coming today from [place] and plans to stay with her Mom. Facility will provide Home Health services ordered by physician. (HHPT/RN/OT has been recommended). Will continue to be available for disposition and/or discharge needs.                Delrae Sawyers, [place]

## 2015-10-20 NOTE — Discharge Summary (Signed)
Physician Discharge Summary  Patient ID: Linda [name] MRN: [id number] DOB/AGE: 1946/03/22 69 y.o.   Primary Cardiologist: Dr. Stanford Breed  Admit date: 10/16/2015 Discharge date: 10/20/2015  Admission Diagnoses: Acute on chronic diastolic CHF  Discharge Diagnoses:  Principal Problem:   Acute on chronic diastolic CHF (congestive heart failure), NYHA class 1 (HCC) Active Problems:   History of pulmonary embolism   Generalized anxiety disorder--with occasional panic attacks.    Chronic pain syndrome   Hyperlipidemia   CAD (coronary artery disease)   Diastolic heart failure (HCC)   Discharged Condition: stable  Hospital Course: 69 y/o female, followed by Dr. Stanford Breed. She has diastolic congestive heart failure. Cardiac catheterization February 2014 showed nonobstructive disease (most significant 50-60 RCA) and normal LV function; note of 4+ MR. Renal Dopplers May 2014 showed no stenosis. Nuclear study March 2016 showed ejection fraction 96% and no ischemia or infarction. 2D echo 08/2015 showed normal LVEF of 60-65% with G1DD. No WMA. Trivial tricuspid regurgitation was noted. Also with history of presumed orthostatic syncope. She is in independent living at [facility].  She was seen in the [facility] ED 10/11/15 for near syncope immediately following bilateral knee injections at her orthopedic office. There was no serious injury or ongoing weakness. It was felt that her near syncope was vagally induced. There were also incidental findings of hypokalemia and a UTI. She was prescribed K-dur and antibiotics. She was sent back to her assisted living and did not require admission.   She was seen in our office several days later on 10/16/15 by Truitt Merle, NP, and Dr. Angelena Form. She was noted to have signs/ symptoms of acute CHF with increased dyspnea bilateral LEE up to her thighs. She also noted extreme fatigue/ lethargy. Dr. Angelena Form recommended admission.   D-dimer was abnormal however chest  CT was negative for PE. There was no evidence for MI. Cardiac enzymes were negative x 3. Her acute diastolic CHF was treated with IV Lasix. Her volume and respiratory status improved with diuresis. She was later transitioned to PO lasix, 40 mg daily, along with supplemental K. She was continued on ASA, bystolic, Imdur, Lasix and Lipitor. She was continued on antibiotics for her UTI. It was felt that her lethargy was secondary to over sedation. Thus her fentanyl patch was discontinued and her condition improved. She may continue PRN percocet and Xanax at assisted living.   Prior to discharge, she was seen by PT who felt that she could return to assisted living but with additional care. It was recommended that she be discharged with home health services. A RN, PT and OT were ordered. She was last seen and examined by Dr. Martinique, who determined she was stable for discharge home. Post hospital f/u has been arranged 11/02/15 in our office.    Consults: None  Significant Diagnostic Studies:  CT of chest 10/17/15 IMPRESSION: 1. Negative for acute PE or thoracic aortic dissection. 2. Atherosclerosis, including coronary artery disease. Please note that although the presence of coronary artery calcium documents the presence of coronary artery disease, the severity of this disease and any potential stenosis cannot be assessed on this non-gated CT examination. Assessment for potential risk factor modification, dietary therapy or pharmacologic therapy may be warranted, if clinically indicated. 3. Bilateral calcified pleural plaques as before.   Treatments: See hospital course  Discharge Exam: Blood pressure 105/58, pulse 71, temperature 97.5 F (36.4 C), temperature source Oral, resp. rate 18, height 5\' 4"  (1.626 m), weight 167 lb 12.8 oz (76.114 kg),  SpO2 97 %.  Disposition: 01-Home or Self Care      Discharge Instructions    Diet - low sodium heart healthy    Complete by:  As directed       Face-to-face encounter (required for Medicare/Medicaid patients)    Complete by:  As directed   I SIMMONS, BRITTAINY certify that this patient is under my care and that I, or a nurse practitioner or physician's assistant working with me, had a face-to-face encounter that meets the physician face-to-face encounter requirements with this patient on 10/20/2015. The encounter with the patient was in whole, or in part for the following medical condition(s) which is the primary reason for home health care (List medical condition): change in functional status  The encounter with the patient was in whole, or in part, for the following medical condition, which is the primary reason for home health care:  change in functional status  I certify that, based on my findings, the following services are medically necessary home health services:   Nursing Physical therapy    Reason for Medically Necessary Home Health Services:  Skilled Nursing- Change/Decline in Patient Status  My clinical findings support the need for the above services:  Unable to leave home safely without assistance and/or assistive device  Further, I certify that my clinical findings support that this patient is homebound due to:  Unable to leave home safely without assistance     Home Health    Complete by:  As directed   To provide the following care/treatments:   PT RN OT       Increase activity slowly    Complete by:  As directed             Medication List    STOP taking these medications        fentaNYL 12 MCG/HR  Commonly known as:  DURAGESIC - dosed mcg/hr      TAKE these medications        albuterol 108 (90 BASE) MCG/ACT inhaler  Commonly known as:  PROVENTIL HFA;VENTOLIN HFA  Inhale 2 puffs into the lungs every 6 (six) hours as needed for wheezing or shortness of breath.     ALPRAZolam 1 MG tablet  Commonly known as:  XANAX  TAKE 1 TABLET 3 TIMES A DAY.     ARIPiprazole 5 MG tablet  Commonly known as:  ABILIFY    Take 1 tablet (5 mg total) by mouth daily.     aspirin 81 MG EC tablet  Take 1 tablet (81 mg total) by mouth daily.     atorvastatin 20 MG tablet  Commonly known as:  LIPITOR  Take 1 tablet (20 mg total) by mouth daily at 6 PM.     budesonide-formoterol 160-4.5 MCG/ACT inhaler  Commonly known as:  SYMBICORT  Inhale 2 puffs into the lungs 2 (two) times daily.     cephALEXin 500 MG capsule  Commonly known as:  KEFLEX  Take 1 capsule (500 mg total) by mouth 4 (four) times daily.     diclofenac sodium 1 % Gel  Commonly known as:  VOLTAREN  Apply 4 g topically 3 (three) times daily. TO BOTH KNEES     DULoxetine 30 MG capsule  Commonly known as:  CYMBALTA  1 cap po qd to be combined with a 60mg  duloxetine cap     DULoxetine 60 MG capsule  Commonly known as:  CYMBALTA  1 cap po qd to be combined with a 30 mg  duloxetine cap daily     folic acid 1 MG tablet  Commonly known as:  FOLVITE  Take 2 tablets (2 mg total) by mouth daily.     furosemide 40 MG tablet  Commonly known as:  LASIX  Take 1 tablet (40 mg total) by mouth daily.     isosorbide mononitrate 30 MG 24 hr tablet  Commonly known as:  IMDUR  Take 1 tablet (30 mg total) by mouth daily.     multivitamin with minerals Tabs tablet  Take 1 tablet by mouth daily.     nebivolol 5 MG tablet  Commonly known as:  BYSTOLIC  Take 1 tablet (5 mg total) by mouth daily.     nitroGLYCERIN 0.4 MG SL tablet  Commonly known as:  NITROSTAT  Place 0.4 mg under the tongue every 5 (five) minutes as needed for chest pain.     ondansetron 4 MG tablet  Commonly known as:  ZOFRAN  Take 1 tablet (4 mg total) by mouth every 6 (six) hours.     OSTEO BI-FLEX ADV JOINT SHIELD Tabs  Take 1 tablet by mouth daily.     oxyCODONE-acetaminophen 10-325 MG tablet  Commonly known as:  PERCOCET  Take 1 tablet by mouth every 6 (six) hours as needed for pain. ONE MONTH SUPPLY     OXYGEN  Inhale 3 L into the lungs continuous.     pantoprazole  40 MG tablet  Commonly known as:  PROTONIX  Take 1 tablet (40 mg total) by mouth daily.     polyethylene glycol powder powder  Commonly known as:  GLYCOLAX/MIRALAX  Take 17 g by mouth daily.     potassium chloride SA 20 MEQ tablet  Commonly known as:  K-DUR,KLOR-CON  Take 2 tablets (40 mEq total) by mouth daily.     pregabalin 300 MG capsule  Commonly known as:  LYRICA  Take 1 capsule twice a day     sulfamethoxazole-trimethoprim 800-160 MG tablet  Commonly known as:  BACTRIM DS,SEPTRA DS  Take 1 tablet by mouth daily.     traZODone 50 MG tablet  Commonly known as:  DESYREL  Take 3 tablets (150 mg total) by mouth at bedtime.     Vitamin D3 50000 UNITS Caps  Take 50,000 Units by mouth once a week.       Follow-up Information    Follow up with Gearlean Alf, MD On 10/26/2015.   Specialty:  Orthopedic Surgery   Why:  Please contact Dr. Anne Fu office to arrange knee injection ::: appt @ 3: 00pm confirmed w/ Mickel Fuchs information:   [address] [place] [address] [phone]       Follow up with Tammi Sou, MD On 10/26/2015.   Specialty:  Family Medicine   Why:  post hospital follow up appt. @ 1:15pm ... confirmed w/ Diane   Contact information:   [address] [place] [address] [phone]       Follow up with Eileen Stanford, PA-C On 11/02/2015.   Specialties:  Cardiology, Radiology   Why:  10:00 am (cardiology follow-up)   Contact information:   [address] [place] [address] [phone]      TIME SPENT ON DISCHARGE, INCLUDING PHYSICIAN TIME: >30 MINUTES  Signed: Lyda Jester 10/20/2015, 11:41 AM

## 2015-10-20 NOTE — Progress Notes (Signed)
Patient Name: Linda [name] Date of Encounter: 10/20/2015  Primary Cardiologist: Dr. Stanford Breed   Principal Problem:   Acute on chronic diastolic CHF (congestive heart failure), NYHA class 1 ([place]) Active Problems:   History of pulmonary embolism   Generalized anxiety disorder--with occasional panic attacks.    Chronic pain syndrome   Hyperlipidemia   CAD (coronary artery disease)   Diastolic heart failure ([place])    SUBJECTIVE  States she just doesn't fell well but no specific complaints. Was able to walk further.   CURRENT MEDS . ALPRAZolam  1 mg Oral TID  . ARIPiprazole  5 mg Oral Daily  . aspirin EC  81 mg Oral Daily  . atorvastatin  20 mg Oral q1800  . budesonide-formoterol  2 puff Inhalation BID  . diclofenac sodium  4 g Topical TID  . DULoxetine  90 mg Oral Daily  . folic acid  1 mg Oral Daily  . furosemide  40 mg Oral Daily  . heparin  5,000 Units Subcutaneous 3 times per day  . isosorbide mononitrate  30 mg Oral Daily  . nebivolol  5 mg Oral Daily  . pantoprazole  40 mg Oral Daily  . potassium chloride  40 mEq Oral Daily  . pregabalin  300 mg Oral BID  . sodium chloride  3 mL Intravenous Q12H    OBJECTIVE  Filed Vitals:   10/19/15 2008 10/19/15 2126 10/20/15 0454 10/20/15 0822  BP: 114/73  105/58   Pulse: 77  72 71  Temp: 98.1 F (36.7 C)  97.5 F (36.4 C)   TempSrc: Oral  Oral   Resp: 18  20 18   Height:      Weight:   76.114 kg (167 lb 12.8 oz)   SpO2: 100% 97% 97% 97%    Intake/Output Summary (Last 24 hours) at 10/20/15 1024 Last data filed at 10/20/15 0920  Gross per 24 hour  Intake    720 ml  Output   1700 ml  Net   -980 ml   Filed Weights   10/18/15 0541 10/19/15 0417 10/20/15 0454  Weight: 75.9 kg (167 lb 5.3 oz) 76.476 kg (168 lb 9.6 oz) 76.114 kg (167 lb 12.8 oz)    PHYSICAL EXAM  General: Pleasant, NAD. Frail. Neuro: Alert and oriented X 3. Moves all extremities spontaneously. Psych: Normal affect. HEENT:  Normal  Neck:  Supple without bruits or JVD. Lungs:  Resp regular and unlabored, CTA. Heart: RRR no s3, s4, or murmurs. Abdomen: Soft, non-tender, non-distended, BS + x 4.  Extremities: No clubbing, cyanosis or edema. DP/PT/Radials 2+ and equal bilaterally. Deformities noted in finger, likely OA  Accessory Clinical Findings  CBC No results for input(s): WBC, NEUTROABS, HGB, HCT, MCV, PLT in the last 72 hours. Basic Metabolic Panel  Recent Labs  10/18/15 0217 10/19/15 0315  NA 140 138  K 4.1 5.0  CL 96* 96*  CO2 30 31  GLUCOSE 95 113*  BUN 14 22*  CREATININE 1.58* 1.55*  CALCIUM 9.4 9.4   Liver Function Tests No results for input(s): AST, ALT, ALKPHOS, BILITOT, PROT, ALBUMIN in the last 72 hours. No results for input(s): LIPASE, AMYLASE in the last 72 hours. Cardiac Enzymes No results for input(s): CKTOTAL, CKMB, CKMBINDEX, TROPONINI in the last 72 hours. BNP Invalid input(s): POCBNP D-Dimer No results for input(s): DDIMER in the last 72 hours. Thyroid Function Tests No results for input(s): TSH, T4TOTAL, T3FREE, THYROIDAB in the last 72 hours.  Invalid input(s): FREET3  TELE  NSR without significant ventricular ectopy    ECG  No new EKG  Echocardiogram 08/24/2015  LV EF: 60% -  65%  ------------------------------------------------------------------- Indications:   Chest Pain (R07.9).  ------------------------------------------------------------------- History:  PMH: Right Bundle Branch Block, Asthma, Obstructive Sleep Apnea with CPAP. Syncope and dyspnea. Congestive heart failure. Chronic obstructive pulmonary disease. Risk factors: Family history of coronary artery disease. Hypertension.  ------------------------------------------------------------------- Study Conclusions  - Left ventricle: The cavity size was normal. Systolic function was normal. The estimated ejection fraction was in the range of 60% to 65%. Wall motion was normal; there were no  regional wall motion abnormalities. There was an increased relative contribution of atrial contraction to ventricular filling. Doppler parameters are consistent with abnormal left ventricular relaxation (grade 1 diastolic dysfunction). - Tricuspid valve: There was trivial regurgitation.    Radiology/Studies  Dg Chest 2 View  10/17/2015  CLINICAL DATA:  Dyspnea EXAM: CHEST - 2 VIEW COMPARISON:  10/13/2015 FINDINGS: Cardiac shadow is at the upper limits of normal but stable. Aortic calcifications are seen. Multiple calcified pleural plaques are again noted. No focal confluent infiltrate is seen. No sizable effusion is noted. No bony abnormality is seen. IMPRESSION: Chronic calcified pleural plaques.  No acute abnormality noted. Electronically Signed   By: Inez Catalina M.D.   On: 10/17/2015 10:36   Ct Angio Chest Pe W/cm &/or Wo Cm  10/17/2015  CLINICAL DATA:  Chest tightness, shortness of breath, dyspnea EXAM: CT ANGIOGRAPHY CHEST WITH CONTRAST TECHNIQUE: Multidetector CT imaging of the chest was performed using the standard protocol during bolus administration of intravenous contrast. Multiplanar CT image reconstructions and MIPs were obtained to evaluate the vascular anatomy. CONTRAST:  69mL OMNIPAQUE IOHEXOL 350 MG/ML SOLN COMPARISON:  09/15/2014 FINDINGS: Right arm IV contrast injection. SVC patent. RV is nondilated. Satisfactory opacification of pulmonary arteries noted, and there is no evidence of pulmonary emboli. Patent bilateral pulmonary veins. Moderate coronary calcifications. Adequate contrast opacification of the thoracic aorta with no evidence of dissection, aneurysm, or stenosis. There is classic 3-vessel brachiocephalic arch anatomy without proximal stenosis. Scattered plaque in the aortic arch. No pleural or pericardial effusion. Bilateral partially calcified pleural plaques. No hilar or mediastinal adenopathy. Linear scarring or subsegmental atelectasis posteriorly in both  lower lobes. No confluent airspace disease or overt edema. Visualized portions of upper abdomen unremarkable. Review of the MIP images confirms the above findings. IMPRESSION: 1. Negative for acute PE or thoracic aortic dissection. 2. Atherosclerosis, including coronary artery disease. Please note that although the presence of coronary artery calcium documents the presence of coronary artery disease, the severity of this disease and any potential stenosis cannot be assessed on this non-gated CT examination. Assessment for potential risk factor modification, dietary therapy or pharmacologic therapy may be warranted, if clinically indicated. 3. Bilateral calcified pleural plaques as before. Electronically Signed   By: Lucrezia Europe M.D.   On: 10/17/2015 15:07    ASSESSMENT AND PLAN  1. Acute on chronic diastolic HF   - euvolemic on exam. Continue 40mg  daily PO lasix.   - continue ASA, bystolic, imdur, lasix, lipitor  - discussed importance of Na restriction, fluid restriction and if possible daily weight  - Hold ACEI/ARB. Decrease KCl to 56meq daily  - likely discharge later today- per  SW/case management plan is to go back to [facility] with increased hours of care, HHPT/RN?OT. Daughter coming from [place].    2. Dyspnea and chest pain  - positive d-dimer, negative CTA of chest for PE  3. AMS: over sedated on admission with narcotic, fentanyl patch discontinued. Percocet and Xanax PRN  4. UTI  5. AKI: Cr 0.9. Trended up to 1.5 and stable- probably due to diuresis.   Signed,   Peter Martinique, [place] 10/20/2015 10:24 AM

## 2015-10-20 NOTE — Progress Notes (Signed)
Occupational Therapy Treatment Patient Details Name: Linda [name] MRN: [id number] DOB: 1946-03-27 Today's Date: 10/20/2015    History of present illness Patient is a  69 yo female who presents with Acute on chronic diastolic CHF, generalized weakness, UTI, DOE/chest pain, chronic pain and history of falls at home (independent living [facility]).   OT comments  This 69 yo female admitted with above presents to acute OT with continued decreased balance and mobility affecting her safety and independence with basic ADLs. She will continue to benefit from acute OT with follow up at home from [facility] and 24 hour care by daughter (since SNF is not an option due to pt observation and Medicare and would have to pay out of pocket for SNF stay).  Follow Up Recommendations  Home health OT;Supervision/Assistance - 24 hour    Equipment Recommendations   (TBD at next venue)       Precautions / Restrictions Precautions Precautions: Fall Restrictions Weight Bearing Restrictions: No       Mobility Bed Mobility Overal bed mobility: Needs Assistance Bed Mobility: Supine to Sit     Supine to sit: Min guard        Transfers Overall transfer level: Needs assistance Equipment used: Rolling walker (2 wheeled) Transfers: Sit to/from Stand Sit to Stand: Min assist         General transfer comment: See PT note for addtional info on mobility    Balance Overall balance assessment: Needs assistance Sitting-balance support: No upper extremity supported;Feet supported Sitting balance-Leahy Scale: Fair Sitting balance - Comments: tendency for posterior lean initially, but once she got fully to EOB did not display with posterior lean.                           ADL Overall ADL's : Needs assistance/impaired     Grooming: Oral care;Brushing hair;Sitting;Standing;Min guard               Lower Body Dressing: Minimal assistance (to get depends and socks started, and then min  A sit<>stand)   Toilet Transfer: Minimal assistance;Ambulation;RW                              Cognition   Behavior During Therapy: Flat affect Overall Cognitive Status: No family/caregiver present to determine baseline cognitive functioning (slow to respond, process, and move)                                    Pertinent Vitals/ Pain       Pain Assessment: No/denies pain Faces Pain Scale: Hurts little more Pain Location: low back at rest Pain Descriptors / Indicators: Aching;Sore Pain Intervention(s): Monitored during session;Repositioned         Frequency Min 2X/week     Progress Toward Goals  OT Goals(current goals can now be found in the care plan section)  Progress towards OT goals: Progressing toward goals     Plan Discharge plan needs to be updated    Co-evaluation    PT/OT/SLP Co-Evaluation/Treatment: Yes Reason for Co-Treatment: Complexity of the patient's impairments (multi-system involvement);For patient/therapist safety   OT goals addressed during session: ADL's and self-care;Strengthening/ROM      End of Session Equipment Utilized During Treatment: Gait belt;Rolling walker;Oxygen   Activity Tolerance Patient limited by fatigue (but better than last session)   Patient  Left in chair;with call bell/phone within reach;with chair alarm set   Nurse Communication          Time: [phone] OT Time Calculation (min): 37 min  Charges: OT General Charges $OT Visit: 1 Procedure OT Treatments $Self Care/Home Management : 8-22 mins  Almon Register [phone] 10/20/2015, 12:42 PM

## 2015-10-20 NOTE — Discharge Summary (Signed)
Pt got discharged, discharge instructions provided and patient showed understanding to it, IV taken out,Telemonitor DC,pt left unit in wheelchair with all of the belongings. 

## 2015-10-20 NOTE — Progress Notes (Signed)
Physical Therapy Treatment Patient Details Name: Linda [name] MRN: [id number] DOB: [birth year] Today's Date: 10/20/2015    History of Present Illness Patient is a  69 yo female who presents with Acute on chronic diastolic CHF, generalized weakness, UTI, DOE/chest pain, chronic pain and history of falls at home (independent living [facility]).    PT Comments    Pt is progressing well with PT.  Today's session focused on safety with ambulation and activity tolerance.  Pt requires further acute skilled PT services to maximize safety and independence with functional mobility and to increase activity tolerance.  Continue to recommend SNF upon discharge.  Follow Up Recommendations  SNF;Supervision for mobility/OOB     Equipment Recommendations  None recommended by PT    Recommendations for Other Services       Precautions / Restrictions Precautions Precautions: Fall Restrictions Weight Bearing Restrictions: No    Mobility  Bed Mobility Overal bed mobility: Needs Assistance Bed Mobility: Supine to Sit     Supine to sit: Min guard        Transfers Overall transfer level: Needs assistance Equipment used: Rolling walker (2 wheeled) Transfers: Sit to/from Stand Sit to Stand: Min assist         General transfer comment: See PT note for addtional info on mobility  Ambulation/Gait Ambulation/Gait assistance: Min assist;+2 safety/equipment (chair follow) Ambulation Distance (Feet): 75 Feet (75'+25') Assistive device: Rolling walker (2 wheeled) Gait Pattern/deviations: Step-through pattern;Decreased stride length;Trunk flexed Gait velocity: slow Gait velocity interpretation: Below normal speed for age/gender General Gait Details: Pt with very slow and stiff movements.  O2 stable throughout session on 3L O2 with mild noted dyspnea.  Pt c/o dyspnea and chest tightness during ambulation which resolved with rest.  Pt feeling "weak in the knees" with bilateral knees  buckling x2 requiring Min A to maintain upright.   Stairs            Wheelchair Mobility    Modified Rankin (Stroke Patients Only)       Balance Overall balance assessment: Needs assistance Sitting-balance support: No upper extremity supported;Feet unsupported Sitting balance-Leahy Scale: Fair Sitting balance - Comments: tendency for posterior lean initially, but once she got fully to EOB did not display with posterior lean.     Standing balance-Leahy Scale: Poor (reliance on UE support to maintain)                      Cognition Arousal/Alertness: Awake/alert Behavior During Therapy: Flat affect Overall Cognitive Status: No family/caregiver present to determine baseline cognitive functioning (slow to process, respond, and move)                      Exercises      General Comments General comments (skin integrity, edema, etc.): Pt reported lightheaded with supine to sit.  BP 116/67 and symptoms resolved quickly.      Pertinent Vitals/Pain Pain Assessment: No/denies pain Faces Pain Scale: Hurts little more Pain Location: low back at rest Pain Descriptors / Indicators: Aching;Sore Pain Intervention(s): Monitored during session;Repositioned    Home Living                      Prior Function            PT Goals (current goals can now be found in the care plan section) Acute Rehab PT Goals Patient Stated Goal: none stated PT Goal Formulation: With patient Time For Goal Achievement: 11/01/15 Potential  to Achieve Goals: Fair Progress towards PT goals: Progressing toward goals    Frequency  Min 2X/week    PT Plan Current plan remains appropriate    Co-evaluation PT/OT/SLP Co-Evaluation/Treatment: Yes Reason for Co-Treatment: Complexity of the patient's impairments (multi-system involvement);For patient/therapist safety PT goals addressed during session: Mobility/safety with mobility;Proper use of DME OT goals addressed during  session: ADL's and self-care;Strengthening/ROM     End of Session Equipment Utilized During Treatment: Gait belt;Oxygen Activity Tolerance: Patient limited by fatigue Patient left: in chair;with call bell/phone within reach;with chair alarm set     Time: 0312-8118 PT Time Calculation (min) (ACUTE ONLY): 34 min  Charges:   1 PT Visit   1 Gait Training                     G CodesEstill Bamberg Dia Donate 10/22/2015, 1:06 PM Lorita Officer, SPT

## 2015-10-23 ENCOUNTER — Telehealth: Payer: Self-pay | Admitting: Cardiology

## 2015-10-23 ENCOUNTER — Other Ambulatory Visit: Payer: Self-pay | Admitting: Family Medicine

## 2015-10-23 DIAGNOSIS — M545 Low back pain: Secondary | ICD-10-CM | POA: Diagnosis not present

## 2015-10-23 DIAGNOSIS — M6281 Muscle weakness (generalized): Secondary | ICD-10-CM | POA: Diagnosis not present

## 2015-10-23 DIAGNOSIS — R296 Repeated falls: Secondary | ICD-10-CM | POA: Diagnosis not present

## 2015-10-23 DIAGNOSIS — M25562 Pain in left knee: Secondary | ICD-10-CM | POA: Diagnosis not present

## 2015-10-23 NOTE — Telephone Encounter (Signed)
D/C phone call.[name] Kitchen Appt is on 11/02/15 at 10am w/ Angelena Form at the [facility] office .  Thanks

## 2015-10-23 NOTE — Telephone Encounter (Signed)
Spoke with pt, she voiced understanding of medication change and location and time of appt with dr Stanford Breed

## 2015-10-23 NOTE — Telephone Encounter (Signed)
Increase lasix to 40 BID for 2 days and then resume 40 mg daily; bmet one week; low Na diet and fluid restriction. Can add to my schedule Friday 11/04 at 3 pm [name]

## 2015-10-23 NOTE — Telephone Encounter (Signed)
Spoke with patient who reports weight gain since hospital discharge. She is taking medications as prescribed.   She weights 167lbs on Saturday, 166.5lbs on Sunday, 170 lbs today.  She is short of breat with exertion, some with rest She has no swelling.  She has no change in sleeping patterns/no orthopnea.  She has no chest discomfort.  She has not monitored other VS.  Last BMET 10/27 from hospital.   She would like to see MD instead of K. Grandville Silos, [place] on 11/10 as arranged per discharge. She would like to see Dr. Stanford Breed or Dr. Martinique (whom she saw in hospital). Informed her that MD may not have available appointments as soon as PAs.   Routed to MD to review and advise on patient's complaints.

## 2015-10-23 NOTE — Telephone Encounter (Signed)
Linda [name] just was release from the  hospital on Friday and was told to call if she gained more than 3lbs  And she gained 4 lbs since yesterday . Please call [phone]  Thanks

## 2015-10-23 NOTE — Telephone Encounter (Signed)
Patient contacted regarding discharge from [facility] on 10/20/2015.  Patient understands to follow up with provider K. Grandville Silos, [place] on 11/02/15 at 10am at [facility]. Patient understands discharge instructions? YES Patient understands medications and regiment? YES Patient understands to bring all medications to this visit? YES  Patient had complaints - documented in another telephone note.  Patient wants to see MD - either [place] or Martinique - versus PA.

## 2015-10-23 NOTE — Telephone Encounter (Signed)
RF request for duloxetine LOV: 09/07/15 Next ov: 10/26/15 Last written: 03/20/15 #30 w/ 6RF

## 2015-10-24 DIAGNOSIS — M25561 Pain in right knee: Secondary | ICD-10-CM | POA: Diagnosis not present

## 2015-10-24 DIAGNOSIS — M545 Low back pain: Secondary | ICD-10-CM | POA: Diagnosis not present

## 2015-10-24 DIAGNOSIS — M25562 Pain in left knee: Secondary | ICD-10-CM | POA: Diagnosis not present

## 2015-10-24 DIAGNOSIS — R296 Repeated falls: Secondary | ICD-10-CM | POA: Diagnosis not present

## 2015-10-24 DIAGNOSIS — M6281 Muscle weakness (generalized): Secondary | ICD-10-CM | POA: Diagnosis not present

## 2015-10-24 NOTE — Progress Notes (Signed)
HPI: FU diastolic congestive heart failure. Cardiac catheterization February 2014 showed nonobstructive disease (most significant 50-60 RCA) and normal LV function; note of 4+ MR. Renal Dopplers May 2014 showed no stenosis. Nuclear study March 2016 showed ejection fraction 96% and no ischemia or infarction. Also with history of presumed orthostatic syncope. Previously followed by Dr Terrence Dupont. Echocardiogram repeated September 2016 and showed normal LV function, grade 1 diastolic dysfunction and trace tricuspid regurgitation. Admitted October 2016 with acute diastolic congestive heart failure. Patient was treated with diuretics with improvement. Since last seen, Patient has dyspnea which sounds to be chronic. She also has chest tightness which is chronic. She states she has gained weight but there is no pedal edema.  Current Outpatient Prescriptions  Medication Sig Dispense Refill  . albuterol (PROVENTIL HFA;VENTOLIN HFA) 108 (90 BASE) MCG/ACT inhaler Inhale 2 puffs into the lungs every 6 (six) hours as needed for wheezing or shortness of breath. 1 Inhaler 1  . ALPRAZolam (XANAX) 1 MG tablet TAKE 1 TABLET 3 TIMES A DAY. 90 tablet 5  . ARIPiprazole (ABILIFY) 5 MG tablet Take 1 tablet (5 mg total) by mouth daily. 30 tablet 6  . aspirin 81 MG EC tablet Take 1 tablet (81 mg total) by mouth daily. 30 tablet   . atorvastatin (LIPITOR) 20 MG tablet Take 1 tablet (20 mg total) by mouth daily at 6 PM. 90 tablet 1  . budesonide-formoterol (SYMBICORT) 160-4.5 MCG/ACT inhaler Inhale 2 puffs into the lungs 2 (two) times daily. 1 Inhaler 12  . Cholecalciferol (VITAMIN D3) 50000 UNITS CAPS Take 50,000 Units by mouth once a week. (Patient taking differently: Take 50,000 Units by mouth once a week. Take on Mondays) 12 capsule 0  . diclofenac sodium (VOLTAREN) 1 % GEL Apply 4 g topically 3 (three) times daily. TO BOTH KNEES (Patient taking differently: Apply 4 g topically daily as needed (knee pain). ) 100 g 6  .  DULoxetine (CYMBALTA) 30 MG capsule TAKE 1 CAPSULE ONCE DAILY ALONG WITH 60MG  CAPSULE FOR TOTAL 90MG  DAILY. 30 capsule 11  . DULoxetine (CYMBALTA) 60 MG capsule 1 cap po qd to be combined with a 30 mg duloxetine cap daily (Patient taking differently: Take 60 mg by mouth at bedtime. to be combined with a 30 mg duloxetine cap daily) 90 capsule 1  . folic acid (FOLVITE) 1 MG tablet Take 2 tablets (2 mg total) by mouth daily. 60 tablet 6  . furosemide (LASIX) 40 MG tablet Take 1 tablet (40 mg total) by mouth daily. 30 tablet 5  . isosorbide mononitrate (IMDUR) 30 MG 24 hr tablet Take 1 tablet (30 mg total) by mouth daily. 90 tablet 1  . Misc Natural Products (OSTEO BI-FLEX ADV JOINT SHIELD) TABS Take 1 tablet by mouth daily.    . Multiple Vitamin (MULTIVITAMIN WITH MINERALS) TABS tablet Take 1 tablet by mouth daily.    . nebivolol (BYSTOLIC) 5 MG tablet Take 1 tablet (5 mg total) by mouth daily. 30 tablet 6  . nitroGLYCERIN (NITROSTAT) 0.4 MG SL tablet Place 0.4 mg under the tongue every 5 (five) minutes as needed for chest pain.    [name] Kitchen ondansetron (ZOFRAN) 4 MG tablet Take 1 tablet (4 mg total) by mouth every 6 (six) hours. (Patient taking differently: Take 4 mg by mouth every 6 (six) hours as needed for nausea or vomiting. ) 12 tablet 0  . oxyCODONE-acetaminophen (PERCOCET) 10-325 MG tablet Take 1 tablet by mouth every 6 (six) hours as needed for  pain. ONE MONTH SUPPLY 100 tablet 0  . OXYGEN Inhale 3 L into the lungs continuous.    . pantoprazole (PROTONIX) 40 MG tablet Take 1 tablet (40 mg total) by mouth daily. 90 tablet 1  . polyethylene glycol powder (GLYCOLAX/MIRALAX) powder Take 17 g by mouth daily. (Patient taking differently: Take 17 g by mouth daily as needed for mild constipation or moderate constipation. ) 3350 g 6  . potassium chloride SA (K-DUR,KLOR-CON) 20 MEQ tablet Take 2 tablets (40 mEq total) by mouth daily. 60 tablet 5  . pregabalin (LYRICA) 300 MG capsule Take 1 capsule twice a day  (Patient taking differently: Take 300 mg by mouth 2 (two) times daily. ) 60 capsule 5  . sulfamethoxazole-trimethoprim (BACTRIM DS,SEPTRA DS) 800-160 MG per tablet Take 1 tablet by mouth daily. 30 tablet 6  . traZODone (DESYREL) 50 MG tablet Take 3 tablets (150 mg total) by mouth at bedtime. 90 tablet 6   No current facility-administered medications for this visit.   Facility-Administered Medications Ordered in Other Visits  Medication Dose Route Frequency Provider Last Rate Last Dose  . ferumoxytol (FERAHEME) 510 mg in sodium chloride 0.9 % 100 mL IVPB  510 mg Intravenous Once Volanda Napoleon, MD         Past Medical History  Diagnosis Date  . HTN (hypertension)   . Depression   . Recurrent UTI     hx of hospitalization for pyelonephritis; started abx prophylaxis 06/2015  . Hay fever   . Mixed incontinence urge and stress   . Diverticular disease   . Insomnia   . Fibromyalgia     Patient states dx was around her late 2s but she had sx's for years prior to this.  . Syncope     Hypotensive; ED visit--Dr. Terrence Dupont did Cath--nonobstructive CAD, EF 55-60%.  In retrospect, suspect pt rx med misuse/polypharmacy  . Idiopathic angio-edema-urticaria 72014    Angioedema component was very minimal  . Asthma     w/ asbestososis   . History of pneumonia     hospitalized 12/2011, 02/2013, and 07/2013 [facility]) for this  . Anginal pain ([place])     Nonobstructive CAD 2014; however, her cardiologist put her on a statin for this and NOT for hyperlipidemia per pt report.  . OSA on CPAP     prior to move to --had another sleep study 10/2015 w/pulm Dr. Camillo Flaming.  . H/O hiatal hernia   . Migraine syndrome     "not as often anymore; used to be ~ q wk" (07/12/2013)  . Tension headache, chronic   . DDD (degenerative disc disease)     lumbar and cervical.   . Osteoarthritis     "severe; progressing fast" (07/12/2013); multiple joints-not surgical candidate for TKR (03/2015)  . Chronic lower back pain   .  Anxiety     panic attacks  . Nephrolithiasis     "passed all on my own or they are still in there" (07/12/2013)  . Pyelonephritis     "several times over the last 30 yr" (07/12/2013)  . Diastolic congestive heart failure ([place])   . COPD (chronic obstructive pulmonary disease) ([place])   . Pulmonary embolism ([place]) 07/2013    Dx at [facility] with very small peripheral upper lobe pe 07/2013: pt took coumadin for about 8-9 mo  . Pleural plaque with presence of asbestos 07/22/2013  . BPPV (benign paroxysmal positional vertigo) 12/16/2012  . RBBB (right bundle branch block)   . Acute upper GI  bleed 06/2014    while pt taking coumadin, plavix, and meloxicam---despite being told not to take coumadin.  . Iron deficiency anemia     Hematologist in [place], [place] did extensive w/u; no cause found; failed oral supplement;; gets fairly regular (q29m or so) IV iron infusions (Venofer -iron sucrose- 200mg  with procrit.  "for 14 yr I've been getting blood work q month & getting infusions prn" (07/12/2013).  Dr. Marin Olp locally, iron infusions done, EPO deficiency dx'd  . Pernicious anemia 08/24/2014    Past Surgical History  Procedure Laterality Date  . Appendectomy  1960  . Total abdominal hysterectomy  1974  . Tendon release  1996    Right forearm and hand  . Knee surgery  2005  . Heel spur surgery Left 2008  . Plantar fascia release Left 2008  . Axillary surgery Left 1978    Multiple "lump" in armpit per pt  . Coccyx removal  1972  . Cardiac catheterization  01/2013    nonobstructive CAD, EF 55-60%  . Transthoracic echocardiogram  01/2013; 04/2014;08/2015    2014--NORMAL.  2015--focal basal septal hypertrophy, EF 55-60%, grade I diast dysfxn, mild LAE.  08/2015 EF 55-60%, nl LV syst fxn, grade I DD, valves wnl  . Dilation and curettage of uterus  ? 1970's  . Eye surgery Left 2012-2013    "injections for ~ 1 yr; don't really know what for" (07/12/2013)  . Spirometry  04/25/14    In hosp for acute asthma/COPD flare:  mixed obstructive and restrictive lung disease. The FEV1 is severely reduced at 45% predicted.  FEV1 signif decreased compared to prior spirometry 07/23/13.  [name] Kitchen Esophagogastroduodenoscopy N/A 07/19/2014    Gastritis found + in the setting of supratherapeutic INR, +plavix, + meloxicam.  . Left heart catheterization with coronary angiogram N/A 01/30/2013    Procedure: LEFT HEART CATHETERIZATION WITH CORONARY ANGIOGRAM;  Surgeon: Clent Demark, MD;  Location: [facility] CATH LAB;  Service: Cardiovascular;  Laterality: N/A;  . Cardiovascular stress test  02/22/15    Low risk myocard perf imaging; wall motion normal, normal EF    Social History   Social History  . Marital Status: Widowed    Spouse Name: N/A  . Number of Children: 2  . Years of Education: N/A   Occupational History  . Not on file.   Social History Main Topics  . Smoking status: Never Smoker   . Smokeless tobacco: Never Used     Comment: never used tobacco  . Alcohol Use: No  . Drug Use: No  . Sexual Activity: Not Currently   Other Topics Concern  . Not on file   Social History Narrative   Widowed, 2 sons.  Relocated to [place] 09/2012 to be closer to her son who has MS.   Husband d 2015--mesothelioma.   Occupation: former Pharmacist, hospital.   Education: masters degree level.   No T/A/Ds.    ROS: Complains of arthralgias, fatigue but no fevers or chills, productive cough, hemoptysis, dysphasia, odynophagia, melena, hematochezia, dysuria, hematuria, rash, seizure activity, orthopnea, PND, pedal edema, claudication. Remaining systems are negative.  Physical Exam: Well-developed chronically ill appearing in no acute distress.  Skin is warm and dry.  HEENT is normal.  Neck is supple.  Chest is clear to auscultation with normal expansion.  Cardiovascular exam is regular rate and rhythm.  Abdominal exam nontender or distended. No masses palpated. Extremities show no edema. neuro grossly intact   Electrocardiogram shows sinus rhythm, right  bundle branch block. Prior inferior  infarct cannot be excluded. No change compared to 10/16/2015.

## 2015-10-25 DIAGNOSIS — R296 Repeated falls: Secondary | ICD-10-CM | POA: Diagnosis not present

## 2015-10-25 DIAGNOSIS — M6281 Muscle weakness (generalized): Secondary | ICD-10-CM | POA: Diagnosis not present

## 2015-10-25 DIAGNOSIS — M545 Low back pain: Secondary | ICD-10-CM | POA: Diagnosis not present

## 2015-10-25 DIAGNOSIS — M25562 Pain in left knee: Secondary | ICD-10-CM | POA: Diagnosis not present

## 2015-10-25 DIAGNOSIS — M25561 Pain in right knee: Secondary | ICD-10-CM | POA: Diagnosis not present

## 2015-10-26 ENCOUNTER — Ambulatory Visit (INDEPENDENT_AMBULATORY_CARE_PROVIDER_SITE_OTHER): Payer: Medicare Other | Admitting: Family Medicine

## 2015-10-26 ENCOUNTER — Encounter: Payer: Self-pay | Admitting: Family Medicine

## 2015-10-26 VITALS — BP 114/66 | HR 77 | Temp 98.4°F | Resp 16

## 2015-10-26 DIAGNOSIS — R296 Repeated falls: Secondary | ICD-10-CM | POA: Diagnosis not present

## 2015-10-26 DIAGNOSIS — M25561 Pain in right knee: Secondary | ICD-10-CM | POA: Diagnosis not present

## 2015-10-26 DIAGNOSIS — F332 Major depressive disorder, recurrent severe without psychotic features: Secondary | ICD-10-CM | POA: Diagnosis not present

## 2015-10-26 DIAGNOSIS — I2510 Atherosclerotic heart disease of native coronary artery without angina pectoris: Secondary | ICD-10-CM | POA: Diagnosis not present

## 2015-10-26 DIAGNOSIS — M1712 Unilateral primary osteoarthritis, left knee: Secondary | ICD-10-CM | POA: Diagnosis not present

## 2015-10-26 DIAGNOSIS — I5032 Chronic diastolic (congestive) heart failure: Secondary | ICD-10-CM | POA: Diagnosis not present

## 2015-10-26 DIAGNOSIS — N390 Urinary tract infection, site not specified: Secondary | ICD-10-CM

## 2015-10-26 DIAGNOSIS — M25562 Pain in left knee: Secondary | ICD-10-CM | POA: Diagnosis not present

## 2015-10-26 DIAGNOSIS — M545 Low back pain: Secondary | ICD-10-CM | POA: Diagnosis not present

## 2015-10-26 DIAGNOSIS — M6281 Muscle weakness (generalized): Secondary | ICD-10-CM | POA: Diagnosis not present

## 2015-10-26 DIAGNOSIS — M1711 Unilateral primary osteoarthritis, right knee: Secondary | ICD-10-CM | POA: Diagnosis not present

## 2015-10-26 DIAGNOSIS — M170 Bilateral primary osteoarthritis of knee: Secondary | ICD-10-CM | POA: Diagnosis not present

## 2015-10-26 NOTE — Progress Notes (Signed)
Urine drug screen for this encounter is consistent for prescribed medication 

## 2015-10-26 NOTE — Progress Notes (Signed)
OFFICE VISIT  10/26/2015   CC:  Chief Complaint  Patient presents with  . Hospitalization Follow-up    CHF - Not TCM     HPI:    Patient is a 69 y.o. Caucasian female who presents for hosp f/u and f/u chronic severe depression and recurrent UTIs. Admitted 10/24-10/28, 2016 for acute on chronic diastolic CHF.  Improved with diuresis.  Reviewed d/c summary today. She went back to her apartment, no in ALF.  She has a caregiver coming to her home 8 hours a day.  Also getting home PT/OT.  Her fentanyl was d/c'd in hosp and upon d/c home b/c it was felt this was contributing to oversedation.   Mood: improved since getting back on abilify 5mg  last visit.    Denies dysuria, hematuria, urinary urgency, or foul urine odor.  Finished keflex for UTI dx'd 10/12/15. Breathing feeling much improved and LE swelling much improved since d/c from hosp, but still feeling very fatigued. Pt eating well, drinking well.  +Low sodium.  Pt states she has gained 4 lbs each of the last 2 days (prior to today) so she called her cardiologist and has appt to see him tomorrow.  Lasix dose was adjusted by cardiology over the phone as well:  yesterday and day before she took 80 mg lasix qd instead of 40 qd.   Past Medical History  Diagnosis Date  . HTN (hypertension)   . Depression   . Recurrent UTI     hx of hospitalization for pyelonephritis; started abx prophylaxis 06/2015  . Hay fever   . Mixed incontinence urge and stress   . Diverticular disease   . Insomnia   . Fibromyalgia     Patient states dx was around her late 92s but she had sx's for years prior to this.  . Syncope     Hypotensive; ED visit--Dr. Terrence Dupont did Cath--nonobstructive CAD, EF 55-60%.  In retrospect, suspect pt rx med misuse/polypharmacy  . Idiopathic angio-edema-urticaria 72014    Angioedema component was very minimal  . Asthma     w/ asbestososis   . History of pneumonia     hospitalized 12/2011, 02/2013, and 07/2013 [facility]) for this   . Anginal pain ([place])     Nonobstructive CAD 2014; however, her cardiologist put her on a statin for this and NOT for hyperlipidemia per pt report.  . OSA on CPAP     prior to move to [place]--had another sleep study 10/2015 w/pulm Dr. Camillo Flaming.  . H/O hiatal hernia   . Migraine syndrome     "not as often anymore; used to be ~ q wk" (07/12/2013)  . Tension headache, chronic   . DDD (degenerative disc disease)     lumbar and cervical.   . Osteoarthritis     "severe; progressing fast" (07/12/2013); multiple joints-not surgical candidate for TKR (03/2015)  . Chronic lower back pain   . Anxiety     panic attacks  . Nephrolithiasis     "passed all on my own or they are still in there" (07/12/2013)  . Pyelonephritis     "several times over the last 30 yr" (07/12/2013)  . Diastolic congestive heart failure ([place])   . COPD (chronic obstructive pulmonary disease) ([place])   . Pulmonary embolism ([place]) 07/2013    Dx at [facility] with very small peripheral upper lobe pe 07/2013: pt took coumadin for about 8-9 mo  . Pleural plaque with presence of asbestos 07/22/2013  . BPPV (benign paroxysmal positional vertigo) 12/16/2012  .  RBBB (right bundle branch block)   . Acute upper GI bleed 06/2014    while pt taking coumadin, plavix, and meloxicam---despite being told not to take coumadin.  . Iron deficiency anemia     Hematologist in [place], [place] did extensive w/u; no cause found; failed oral supplement;; gets fairly regular (q26m or so) IV iron infusions (Venofer -iron sucrose- 200mg  with procrit.  "for 14 yr I've been getting blood work q month & getting infusions prn" (07/12/2013).  Dr. Marin Olp locally, iron infusions done, EPO deficiency dx'd  . Pernicious anemia 08/24/2014    Past Surgical History  Procedure Laterality Date  . Appendectomy  1960  . Total abdominal hysterectomy  1974  . Tendon release  1996    Right forearm and hand  . Knee surgery  2005  . Heel spur surgery Left 2008  . Plantar fascia release Left  2008  . Axillary surgery Left 1978    Multiple "lump" in armpit per pt  . Coccyx removal  1972  . Cardiac catheterization  01/2013    nonobstructive CAD, EF 55-60%  . Transthoracic echocardiogram  01/2013; 04/2014;08/2015    2014--NORMAL.  2015--focal basal septal hypertrophy, EF 55-60%, grade I diast dysfxn, mild LAE.  08/2015 EF 55-60%, nl LV syst fxn, grade I DD, valves wnl  . Dilation and curettage of uterus  ? 1970's  . Eye surgery Left 2012-2013    "injections for ~ 1 yr; don't really know what for" (07/12/2013)  . Spirometry  04/25/14    In hosp for acute asthma/COPD flare: mixed obstructive and restrictive lung disease. The FEV1 is severely reduced at 45% predicted.  FEV1 signif decreased compared to prior spirometry 07/23/13.  [name] Kitchen Esophagogastroduodenoscopy N/A 07/19/2014    Gastritis found + in the setting of supratherapeutic INR, +plavix, + meloxicam.  . Left heart catheterization with coronary angiogram N/A 01/30/2013    Procedure: LEFT HEART CATHETERIZATION WITH CORONARY ANGIOGRAM;  Surgeon: Clent Demark, MD;  Location: [facility] CATH LAB;  Service: Cardiovascular;  Laterality: N/A;  . Cardiovascular stress test  02/22/15    Low risk myocard perf imaging; wall motion normal, normal EF    Outpatient Prescriptions Prior to Visit  Medication Sig Dispense Refill  . albuterol (PROVENTIL HFA;VENTOLIN HFA) 108 (90 BASE) MCG/ACT inhaler Inhale 2 puffs into the lungs every 6 (six) hours as needed for wheezing or shortness of breath. 1 Inhaler 1  . ALPRAZolam (XANAX) 1 MG tablet TAKE 1 TABLET 3 TIMES A DAY. 90 tablet 5  . ARIPiprazole (ABILIFY) 5 MG tablet Take 1 tablet (5 mg total) by mouth daily. 30 tablet 6  . aspirin 81 MG EC tablet Take 1 tablet (81 mg total) by mouth daily. 30 tablet   . atorvastatin (LIPITOR) 20 MG tablet Take 1 tablet (20 mg total) by mouth daily at 6 PM. 90 tablet 1  . budesonide-formoterol (SYMBICORT) 160-4.5 MCG/ACT inhaler Inhale 2 puffs into the lungs 2 (two) times daily. 1  Inhaler 12  . Cholecalciferol (VITAMIN D3) 50000 UNITS CAPS Take 50,000 Units by mouth once a week. (Patient taking differently: Take 50,000 Units by mouth once a week. Take on Mondays) 12 capsule 0  . diclofenac sodium (VOLTAREN) 1 % GEL Apply 4 g topically 3 (three) times daily. TO BOTH KNEES (Patient taking differently: Apply 4 g topically daily as needed (knee pain). ) 100 g 6  . DULoxetine (CYMBALTA) 30 MG capsule TAKE 1 CAPSULE ONCE DAILY ALONG WITH 60MG  CAPSULE FOR TOTAL 90MG  DAILY. [place]  capsule 11  . DULoxetine (CYMBALTA) 60 MG capsule 1 cap po qd to be combined with a 30 mg duloxetine cap daily (Patient taking differently: Take 60 mg by mouth at bedtime. to be combined with a 30 mg duloxetine cap daily) 90 capsule 1  . folic acid (FOLVITE) 1 MG tablet Take 2 tablets (2 mg total) by mouth daily. 60 tablet 6  . furosemide (LASIX) 40 MG tablet Take 1 tablet (40 mg total) by mouth daily. 30 tablet 5  . isosorbide mononitrate (IMDUR) 30 MG 24 hr tablet Take 1 tablet (30 mg total) by mouth daily. 90 tablet 1  . Misc Natural Products (OSTEO BI-FLEX ADV JOINT SHIELD) TABS Take 1 tablet by mouth daily.    . Multiple Vitamin (MULTIVITAMIN WITH MINERALS) TABS tablet Take 1 tablet by mouth daily.    . nebivolol (BYSTOLIC) 5 MG tablet Take 1 tablet (5 mg total) by mouth daily. 30 tablet 6  . nitroGLYCERIN (NITROSTAT) 0.4 MG SL tablet Place 0.4 mg under the tongue every 5 (five) minutes as needed for chest pain.    [name] Kitchen ondansetron (ZOFRAN) 4 MG tablet Take 1 tablet (4 mg total) by mouth every 6 (six) hours. (Patient taking differently: Take 4 mg by mouth every 6 (six) hours as needed for nausea or vomiting. ) 12 tablet 0  . oxyCODONE-acetaminophen (PERCOCET) 10-325 MG tablet Take 1 tablet by mouth every 6 (six) hours as needed for pain. ONE MONTH SUPPLY 100 tablet 0  . OXYGEN Inhale 3 L into the lungs continuous.    . pantoprazole (PROTONIX) 40 MG tablet Take 1 tablet (40 mg total) by mouth daily. 90 tablet 1   . polyethylene glycol powder (GLYCOLAX/MIRALAX) powder Take 17 g by mouth daily. (Patient taking differently: Take 17 g by mouth daily as needed for mild constipation or moderate constipation. ) 3350 g 6  . potassium chloride SA (K-DUR,KLOR-CON) 20 MEQ tablet Take 2 tablets (40 mEq total) by mouth daily. 60 tablet 5  . pregabalin (LYRICA) 300 MG capsule Take 1 capsule twice a day (Patient taking differently: Take 300 mg by mouth 2 (two) times daily. ) 60 capsule 5  . sulfamethoxazole-trimethoprim (BACTRIM DS,SEPTRA DS) 800-160 MG per tablet Take 1 tablet by mouth daily. 30 tablet 6  . traZODone (DESYREL) 50 MG tablet Take 3 tablets (150 mg total) by mouth at bedtime. 90 tablet 6  . cephALEXin (KEFLEX) 500 MG capsule Take 1 capsule (500 mg total) by mouth 4 (four) times daily. 28 capsule 0   Facility-Administered Medications Prior to Visit  Medication Dose Route Frequency Provider Last Rate Last Dose  . ferumoxytol (FERAHEME) 510 mg in sodium chloride 0.9 % 100 mL IVPB  510 mg Intravenous Once Volanda Napoleon, MD        Allergies  Allergen Reactions  . Oxycodone     Son asked [place] not to prescribe narcotics for patient due to patient dependence.  See 08/26/14 note  . Penicillins Itching, Swelling and Rash    Tolerated Cefepime in ED. Has patient had a PCN reaction causing immediate rash, facial/tongue/throat swelling, SOB or lightheadedness with hypotension: Yes Has patient had a PCN reaction causing severe rash involving mucus membranes or skin necrosis: No Has patient had a PCN reaction that required hospitalization: No  Has patient had a PCN reaction occurring within the last 10 years: No     ROS As per HPI  PE: Blood pressure 114/66, pulse 77, temperature 98.4 F (36.9 C), temperature source Oral,  resp. rate 16, SpO2 95 %. Pt declined to be weighed today.  Sat done on 3 L oxygen nasal cannulae today. Alert, communicative, lucid. Pink color. CV: RRR, distant S1 and S2, no  m/r/g Calm breathing, with a hint of bibasilar soft, early insp crackles but otherwise clear lungs with good aeration. ABD: soft, NT/ND EXT: no clubbing, cyanosis, or edema.   LABS:  Lab Results  Component Value Date   WBC 8.0 10/16/2015   HGB 12.9 10/16/2015   HCT 38.5 10/16/2015   MCV 88.3 10/16/2015   PLT 200 10/16/2015     Chemistry      Component Value Date/Time   NA 138 10/19/2015 0315   NA 141 06/05/2015 1315   K 5.0 10/19/2015 0315   K 3.9 06/05/2015 1315   CL 96* 10/19/2015 0315   CL 103 06/05/2015 1315   CO2 31 10/19/2015 0315   CO2 28 06/05/2015 1315   BUN 22* 10/19/2015 0315   BUN 14 06/05/2015 1315   CREATININE 1.55* 10/19/2015 0315   CREATININE 0.8 06/05/2015 1315      Component Value Date/Time   CALCIUM 9.4 10/19/2015 0315   CALCIUM 9.0 06/05/2015 1315   ALKPHOS 84 10/16/2015 1730   ALKPHOS 84 06/05/2015 1315   AST 26 10/16/2015 1730   AST 23 06/05/2015 1315   ALT 15 10/16/2015 1730   ALT 17 06/05/2015 1315   BILITOT 0.9 10/16/2015 1730   BILITOT 1.00 06/05/2015 1315     Lab Results  Component Value Date   TSH 0.508 10/17/2015     IMPRESSION AND PLAN:  1) Chronic diastolic CHF; stable at this time.  She'll resume her 40mg  lasix qd dosing today and f/u with cardiology tomorrow.  Low Na diet emphasized again.  Anticipate that cardiology with recheck her BMET tomorrow at her o/v.  2) Recurrent UTI: doing much better on daily bactrim prophylaxis.  Recent e coli UTI resolved with a course of cephalexin.  3) Major depression, recurrent (with superimposed prolonged/abnormal grief response): improved. Continue abilify 5mg  qd, cymbalta 90 mg qd, and xanax 1mg  tid.  An After Visit Summary was printed and given to the patient.  FOLLOW UP: Return in about 3 months (around 01/26/2016) for routine chronic illness f/u.

## 2015-10-26 NOTE — Progress Notes (Signed)
OT Note - addendum    10/20/15 1300  OT Visit Information  Last OT Received On 11/22/15  OT G-codes **NOT FOR INPATIENT CLASS**  Functional Assessment Tool Used clinical judgement  Functional Limitation Self care  Self Care Current Status [phone]) CJ  Self Care Goal Status [phone]) CI  [facility], OTR/L  [phone] 10/20/2015

## 2015-10-26 NOTE — Progress Notes (Signed)
Pre visit review using our clinic review tool, if applicable. No additional management support is needed unless otherwise documented below in the visit note. 

## 2015-10-27 ENCOUNTER — Encounter: Payer: Self-pay | Admitting: Cardiology

## 2015-10-27 ENCOUNTER — Ambulatory Visit (INDEPENDENT_AMBULATORY_CARE_PROVIDER_SITE_OTHER): Payer: Medicare Other | Admitting: Cardiology

## 2015-10-27 VITALS — BP 114/68 | HR 76 | Ht 64.0 in | Wt 172.8 lb

## 2015-10-27 DIAGNOSIS — I10 Essential (primary) hypertension: Secondary | ICD-10-CM | POA: Diagnosis not present

## 2015-10-27 DIAGNOSIS — E785 Hyperlipidemia, unspecified: Secondary | ICD-10-CM | POA: Diagnosis not present

## 2015-10-27 DIAGNOSIS — I5032 Chronic diastolic (congestive) heart failure: Secondary | ICD-10-CM

## 2015-10-27 DIAGNOSIS — R072 Precordial pain: Secondary | ICD-10-CM

## 2015-10-27 DIAGNOSIS — M25561 Pain in right knee: Secondary | ICD-10-CM | POA: Diagnosis not present

## 2015-10-27 DIAGNOSIS — I2510 Atherosclerotic heart disease of native coronary artery without angina pectoris: Secondary | ICD-10-CM | POA: Diagnosis not present

## 2015-10-27 DIAGNOSIS — M6281 Muscle weakness (generalized): Secondary | ICD-10-CM | POA: Diagnosis not present

## 2015-10-27 DIAGNOSIS — I2583 Coronary atherosclerosis due to lipid rich plaque: Secondary | ICD-10-CM

## 2015-10-27 DIAGNOSIS — R296 Repeated falls: Secondary | ICD-10-CM | POA: Diagnosis not present

## 2015-10-27 DIAGNOSIS — M25562 Pain in left knee: Secondary | ICD-10-CM | POA: Diagnosis not present

## 2015-10-27 DIAGNOSIS — M545 Low back pain: Secondary | ICD-10-CM | POA: Diagnosis not present

## 2015-10-27 LAB — BASIC METABOLIC PANEL
BUN: 17 mg/dL (ref 7–25)
CALCIUM: 8.9 mg/dL (ref 8.6–10.4)
CHLORIDE: 101 mmol/L (ref 98–110)
CO2: 30 mmol/L (ref 20–31)
CREATININE: 0.91 mg/dL (ref 0.50–0.99)
Glucose, Bld: 108 mg/dL — ABNORMAL HIGH (ref 65–99)
POTASSIUM: 4.2 mmol/L (ref 3.5–5.3)
Sodium: 141 mmol/L (ref 135–146)

## 2015-10-27 NOTE — Assessment & Plan Note (Signed)
Blood pressure controlled. Continue present medications. 

## 2015-10-27 NOTE — Assessment & Plan Note (Signed)
Patient complains of weight gain and dyspnea but there does not appear to be significantly volume overloaded on examination. Continue present dose of Lasix. Last creatinine had increased. Check renal function and BNP. I'm hesitant to increase her diuretics unless BNP is elevated.

## 2015-10-27 NOTE — Assessment & Plan Note (Signed)
Continue statin. 

## 2015-10-27 NOTE — Patient Instructions (Signed)
Medication Instructions:   NO CHANGE  Labwork:  Your physician recommends that you HAVE LAB WORK TODAY  Follow-Up:  Your physician recommends that you schedule a follow-up appointment in: 3 MONTHS WITH DR CRENSHAW   If you need a refill on your cardiac medications before your next appointment, please call your pharmacy.    

## 2015-10-27 NOTE — Assessment & Plan Note (Signed)
Symptoms are chronic and atypical. No plans for further ischemia evaluation at this point. Most recent nuclear study negative.

## 2015-10-27 NOTE — Assessment & Plan Note (Signed)
Continue aspirin and statin. 

## 2015-10-28 LAB — BRAIN NATRIURETIC PEPTIDE: BRAIN NATRIURETIC PEPTIDE: 19.9 pg/mL (ref 0.0–100.0)

## 2015-10-30 ENCOUNTER — Other Ambulatory Visit: Payer: Self-pay

## 2015-10-30 DIAGNOSIS — D509 Iron deficiency anemia, unspecified: Secondary | ICD-10-CM

## 2015-10-30 DIAGNOSIS — M797 Fibromyalgia: Secondary | ICD-10-CM

## 2015-10-30 DIAGNOSIS — D510 Vitamin B12 deficiency anemia due to intrinsic factor deficiency: Secondary | ICD-10-CM

## 2015-10-30 DIAGNOSIS — M545 Low back pain: Secondary | ICD-10-CM | POA: Diagnosis not present

## 2015-10-30 DIAGNOSIS — M25562 Pain in left knee: Secondary | ICD-10-CM | POA: Diagnosis not present

## 2015-10-30 DIAGNOSIS — M25561 Pain in right knee: Secondary | ICD-10-CM | POA: Diagnosis not present

## 2015-10-30 DIAGNOSIS — M6281 Muscle weakness (generalized): Secondary | ICD-10-CM | POA: Diagnosis not present

## 2015-10-30 DIAGNOSIS — R296 Repeated falls: Secondary | ICD-10-CM | POA: Diagnosis not present

## 2015-10-30 MED ORDER — PREGABALIN 300 MG PO CAPS: ORAL_CAPSULE | ORAL | Status: DC

## 2015-10-31 DIAGNOSIS — M25561 Pain in right knee: Secondary | ICD-10-CM | POA: Diagnosis not present

## 2015-10-31 DIAGNOSIS — M545 Low back pain: Secondary | ICD-10-CM | POA: Diagnosis not present

## 2015-10-31 DIAGNOSIS — R296 Repeated falls: Secondary | ICD-10-CM | POA: Diagnosis not present

## 2015-10-31 DIAGNOSIS — M25562 Pain in left knee: Secondary | ICD-10-CM | POA: Diagnosis not present

## 2015-10-31 DIAGNOSIS — M6281 Muscle weakness (generalized): Secondary | ICD-10-CM | POA: Diagnosis not present

## 2015-11-01 DIAGNOSIS — M6281 Muscle weakness (generalized): Secondary | ICD-10-CM | POA: Diagnosis not present

## 2015-11-01 DIAGNOSIS — R296 Repeated falls: Secondary | ICD-10-CM | POA: Diagnosis not present

## 2015-11-01 DIAGNOSIS — M25561 Pain in right knee: Secondary | ICD-10-CM | POA: Diagnosis not present

## 2015-11-01 DIAGNOSIS — M545 Low back pain: Secondary | ICD-10-CM | POA: Diagnosis not present

## 2015-11-01 DIAGNOSIS — M25562 Pain in left knee: Secondary | ICD-10-CM | POA: Diagnosis not present

## 2015-11-02 ENCOUNTER — Encounter: Payer: Medicare Other | Admitting: Physician Assistant

## 2015-11-02 DIAGNOSIS — M25561 Pain in right knee: Secondary | ICD-10-CM | POA: Diagnosis not present

## 2015-11-02 DIAGNOSIS — M25562 Pain in left knee: Secondary | ICD-10-CM | POA: Diagnosis not present

## 2015-11-02 DIAGNOSIS — R296 Repeated falls: Secondary | ICD-10-CM | POA: Diagnosis not present

## 2015-11-02 DIAGNOSIS — M6281 Muscle weakness (generalized): Secondary | ICD-10-CM | POA: Diagnosis not present

## 2015-11-02 DIAGNOSIS — M545 Low back pain: Secondary | ICD-10-CM | POA: Diagnosis not present

## 2015-11-03 DIAGNOSIS — M25561 Pain in right knee: Secondary | ICD-10-CM | POA: Diagnosis not present

## 2015-11-03 DIAGNOSIS — M6281 Muscle weakness (generalized): Secondary | ICD-10-CM | POA: Diagnosis not present

## 2015-11-03 DIAGNOSIS — M545 Low back pain: Secondary | ICD-10-CM | POA: Diagnosis not present

## 2015-11-03 DIAGNOSIS — R296 Repeated falls: Secondary | ICD-10-CM | POA: Diagnosis not present

## 2015-11-03 DIAGNOSIS — M25562 Pain in left knee: Secondary | ICD-10-CM | POA: Diagnosis not present

## 2015-11-06 DIAGNOSIS — M25561 Pain in right knee: Secondary | ICD-10-CM | POA: Diagnosis not present

## 2015-11-06 DIAGNOSIS — M545 Low back pain: Secondary | ICD-10-CM | POA: Diagnosis not present

## 2015-11-06 DIAGNOSIS — M6281 Muscle weakness (generalized): Secondary | ICD-10-CM | POA: Diagnosis not present

## 2015-11-06 DIAGNOSIS — R296 Repeated falls: Secondary | ICD-10-CM | POA: Diagnosis not present

## 2015-11-06 DIAGNOSIS — M25562 Pain in left knee: Secondary | ICD-10-CM | POA: Diagnosis not present

## 2015-11-07 DIAGNOSIS — M6281 Muscle weakness (generalized): Secondary | ICD-10-CM | POA: Diagnosis not present

## 2015-11-07 DIAGNOSIS — M25561 Pain in right knee: Secondary | ICD-10-CM | POA: Diagnosis not present

## 2015-11-07 DIAGNOSIS — R296 Repeated falls: Secondary | ICD-10-CM | POA: Diagnosis not present

## 2015-11-07 DIAGNOSIS — M25562 Pain in left knee: Secondary | ICD-10-CM | POA: Diagnosis not present

## 2015-11-07 DIAGNOSIS — M545 Low back pain: Secondary | ICD-10-CM | POA: Diagnosis not present

## 2015-11-08 ENCOUNTER — Encounter: Payer: Medicare Other | Admitting: Registered Nurse

## 2015-11-08 DIAGNOSIS — M25561 Pain in right knee: Secondary | ICD-10-CM | POA: Diagnosis not present

## 2015-11-08 DIAGNOSIS — M545 Low back pain: Secondary | ICD-10-CM | POA: Diagnosis not present

## 2015-11-08 DIAGNOSIS — M6281 Muscle weakness (generalized): Secondary | ICD-10-CM | POA: Diagnosis not present

## 2015-11-08 DIAGNOSIS — R296 Repeated falls: Secondary | ICD-10-CM | POA: Diagnosis not present

## 2015-11-08 DIAGNOSIS — M25562 Pain in left knee: Secondary | ICD-10-CM | POA: Diagnosis not present

## 2015-11-09 ENCOUNTER — Telehealth: Payer: Self-pay | Admitting: Cardiology

## 2015-11-09 DIAGNOSIS — M25561 Pain in right knee: Secondary | ICD-10-CM | POA: Diagnosis not present

## 2015-11-09 DIAGNOSIS — R296 Repeated falls: Secondary | ICD-10-CM | POA: Diagnosis not present

## 2015-11-09 DIAGNOSIS — M6281 Muscle weakness (generalized): Secondary | ICD-10-CM | POA: Diagnosis not present

## 2015-11-09 DIAGNOSIS — M25562 Pain in left knee: Secondary | ICD-10-CM | POA: Diagnosis not present

## 2015-11-09 DIAGNOSIS — M545 Low back pain: Secondary | ICD-10-CM | POA: Diagnosis not present

## 2015-11-09 NOTE — Telephone Encounter (Signed)
Will forward for dr crenshaw review  

## 2015-11-09 NOTE — Telephone Encounter (Signed)
New message     What dental office are you calling from? Dr Tommi Rumps 1. What is your office phone and fax number? Fax [phone]  2. What type of procedure is the patient having performed?  cleaninig and filling  3. What date is procedure scheduled?  Not scheduled  4. What is your question (ex. Antibiotics prior to procedure, holding medication-we need to know how long dentist wants pt to hold med)? Pt told dentist that she "almost died".  Dentist need something in writing that pt can have anesthesia for filling tooth.  dentist sure if any medications need to be held.

## 2015-11-09 NOTE — Telephone Encounter (Signed)
This note will be faxed to the number provided. 

## 2015-11-09 NOTE — Telephone Encounter (Signed)
No need for sbe prophylaxis, no cardiac meds need to be held, she can have novacaine [name]

## 2015-11-10 DIAGNOSIS — M545 Low back pain: Secondary | ICD-10-CM | POA: Diagnosis not present

## 2015-11-10 DIAGNOSIS — R296 Repeated falls: Secondary | ICD-10-CM | POA: Diagnosis not present

## 2015-11-10 DIAGNOSIS — L578 Other skin changes due to chronic exposure to nonionizing radiation: Secondary | ICD-10-CM | POA: Diagnosis not present

## 2015-11-10 DIAGNOSIS — M25562 Pain in left knee: Secondary | ICD-10-CM | POA: Diagnosis not present

## 2015-11-10 DIAGNOSIS — M6281 Muscle weakness (generalized): Secondary | ICD-10-CM | POA: Diagnosis not present

## 2015-11-10 DIAGNOSIS — M25561 Pain in right knee: Secondary | ICD-10-CM | POA: Diagnosis not present

## 2015-11-10 DIAGNOSIS — L570 Actinic keratosis: Secondary | ICD-10-CM | POA: Diagnosis not present

## 2015-11-10 DIAGNOSIS — L814 Other melanin hyperpigmentation: Secondary | ICD-10-CM | POA: Diagnosis not present

## 2015-11-13 ENCOUNTER — Telehealth: Payer: Self-pay | Admitting: Cardiology

## 2015-11-13 DIAGNOSIS — M6281 Muscle weakness (generalized): Secondary | ICD-10-CM | POA: Diagnosis not present

## 2015-11-13 DIAGNOSIS — M545 Low back pain: Secondary | ICD-10-CM | POA: Diagnosis not present

## 2015-11-13 DIAGNOSIS — M25561 Pain in right knee: Secondary | ICD-10-CM | POA: Diagnosis not present

## 2015-11-13 DIAGNOSIS — R296 Repeated falls: Secondary | ICD-10-CM | POA: Diagnosis not present

## 2015-11-13 DIAGNOSIS — M25562 Pain in left knee: Secondary | ICD-10-CM | POA: Diagnosis not present

## 2015-11-13 NOTE — Telephone Encounter (Signed)
Linda [name] is returning the nurse's call about needing the fax number to send over the clearance. The number is [phone]. Please f/u with her  Thanks

## 2015-11-13 NOTE — Telephone Encounter (Signed)
Telephone note from 11/17 faxed to the number provided.

## 2015-11-14 ENCOUNTER — Encounter: Payer: Self-pay | Admitting: Registered Nurse

## 2015-11-14 ENCOUNTER — Encounter: Payer: Medicare Other | Attending: Physical Medicine & Rehabilitation | Admitting: Registered Nurse

## 2015-11-14 VITALS — BP 119/71 | HR 90

## 2015-11-14 DIAGNOSIS — R296 Repeated falls: Secondary | ICD-10-CM | POA: Diagnosis not present

## 2015-11-14 DIAGNOSIS — M545 Low back pain: Secondary | ICD-10-CM | POA: Diagnosis not present

## 2015-11-14 DIAGNOSIS — M1711 Unilateral primary osteoarthritis, right knee: Secondary | ICD-10-CM

## 2015-11-14 DIAGNOSIS — Z5181 Encounter for therapeutic drug level monitoring: Secondary | ICD-10-CM | POA: Diagnosis not present

## 2015-11-14 DIAGNOSIS — R2689 Other abnormalities of gait and mobility: Secondary | ICD-10-CM | POA: Diagnosis not present

## 2015-11-14 DIAGNOSIS — I2510 Atherosclerotic heart disease of native coronary artery without angina pectoris: Secondary | ICD-10-CM

## 2015-11-14 DIAGNOSIS — M25562 Pain in left knee: Secondary | ICD-10-CM | POA: Diagnosis not present

## 2015-11-14 DIAGNOSIS — M1712 Unilateral primary osteoarthritis, left knee: Secondary | ICD-10-CM | POA: Diagnosis not present

## 2015-11-14 DIAGNOSIS — M797 Fibromyalgia: Secondary | ICD-10-CM | POA: Diagnosis not present

## 2015-11-14 DIAGNOSIS — M25561 Pain in right knee: Secondary | ICD-10-CM | POA: Diagnosis not present

## 2015-11-14 DIAGNOSIS — Z79899 Other long term (current) drug therapy: Secondary | ICD-10-CM | POA: Diagnosis not present

## 2015-11-14 DIAGNOSIS — G894 Chronic pain syndrome: Secondary | ICD-10-CM

## 2015-11-14 DIAGNOSIS — M6281 Muscle weakness (generalized): Secondary | ICD-10-CM | POA: Diagnosis not present

## 2015-11-14 DIAGNOSIS — M5440 Lumbago with sciatica, unspecified side: Secondary | ICD-10-CM | POA: Diagnosis not present

## 2015-11-14 MED ORDER — OXYCODONE-ACETAMINOPHEN 10-325 MG PO TABS: 1.0000 | ORAL_TABLET | Freq: Four times a day (QID) | ORAL | Status: DC | PRN

## 2015-11-14 NOTE — Progress Notes (Signed)
Subjective:    Patient ID: Linda [name], female    DOB: 12-29-1945, 69 y.o.   MRN: [id number]  HPI: [name] is a 69 year old female who returns for follow up for chronic pain and medication refill. She says her pain is located in her lower back radiating into her right lower extremity laterally and posteriorly, bilateral knees and lower extremities. She rates her pain 7. Her current exercise regime is attending physical and occupational therapy daily and  walking for short distances with her cadillac walker.  She was hospitalized at [facility] 10/16/2015 for Acute on Chronic CHF discharged on 10/20/2015. [name] states she's hasn't applied her fentanyl patches for 3- 4 weeks, also states her children doesn't want her on analgesics. I asked [name] what she would like to do regarding her analgesics she states she wants to return the two prescriptions of Fentanyl patches, their dated for 08/2015 and 10/06/15  also two patches in fentanyl  box. The Fentanyl patches have been destroyed per protocol. [name] states she's still having pain and wants to remain on the Oxycodone at this time, prescription printed and dated not to be  filled till 12/7/ 2016 she verbalizes understanding.  Pain Inventory Average Pain 6 Pain Right Now 7 My pain is constant, stabbing and aching  In the last 24 hours, has pain interfered with the following? General activity 10 Relation with others 9 Enjoyment of life 10 What TIME of day is your pain at its worst? Morning, Daytime and Evening Sleep (in general) Fair  Pain is worse with: walking, bending, sitting, standing and some activites Pain improves with: rest, heat/ice and medication Relief from Meds: 5  Mobility walk with assistance use a walker how many minutes can you walk? 5 ability to climb steps?  no do you drive?  yes  Function I need assistance with the following:  shopping Do you have any goals in this area?   no  Neuro/Psych bladder control problems weakness trouble walking dizziness depression anxiety  Prior Studies Any changes since last visit?  no  Physicians involved in your care Any changes since last visit?  no   Family History  Problem Relation Age of Onset  . Arthritis Mother   . Kidney disease Mother   . Heart disease Father   . Stroke Father   . Hypertension Father   . Diabetes Father    Social History   Social History  . Marital Status: Widowed    Spouse Name: N/A  . Number of Children: 2  . Years of Education: N/A   Social History Main Topics  . Smoking status: Never Smoker   . Smokeless tobacco: Never Used     Comment: never used tobacco  . Alcohol Use: No  . Drug Use: No  . Sexual Activity: Not Currently   Other Topics Concern  . None   Social History Narrative   Widowed, 2 sons.  Relocated to [place] 09/2012 to be closer to her son who has MS.   Husband d 2015--mesothelioma.   Occupation: former Pharmacist, hospital.   Education: masters degree level.   No T/A/Ds.   Past Surgical History  Procedure Laterality Date  . Appendectomy  1960  . Total abdominal hysterectomy  1974  . Tendon release  1996    Right forearm and hand  . Knee surgery  2005  . Heel spur surgery Left 2008  . Plantar fascia release Left 2008  . Axillary surgery  Left 1978    Multiple "lump" in armpit per pt  . Coccyx removal  1972  . Cardiac catheterization  01/2013    nonobstructive CAD, EF 55-60%  . Transthoracic echocardiogram  01/2013; 04/2014;08/2015    2014--NORMAL.  2015--focal basal septal hypertrophy, EF 55-60%, grade I diast dysfxn, mild LAE.  08/2015 EF 55-60%, nl LV syst fxn, grade I DD, valves wnl  . Dilation and curettage of uterus  ? 1970's  . Eye surgery Left 2012-2013    "injections for ~ 1 yr; don't really know what for" (07/12/2013)  . Spirometry  04/25/14    In hosp for acute asthma/COPD flare: mixed obstructive and restrictive lung disease. The FEV1 is severely reduced at  45% predicted.  FEV1 signif decreased compared to prior spirometry 07/23/13.  [name] Kitchen Esophagogastroduodenoscopy N/A 07/19/2014    Gastritis found + in the setting of supratherapeutic INR, +plavix, + meloxicam.  . Left heart catheterization with coronary angiogram N/A 01/30/2013    Procedure: LEFT HEART CATHETERIZATION WITH CORONARY ANGIOGRAM;  Surgeon: Clent Demark, MD;  Location: [facility] CATH LAB;  Service: Cardiovascular;  Laterality: N/A;  . Cardiovascular stress test  02/22/15    Low risk myocard perf imaging; wall motion normal, normal EF   Past Medical History  Diagnosis Date  . HTN (hypertension)   . Depression   . Recurrent UTI     hx of hospitalization for pyelonephritis; started abx prophylaxis 06/2015  . Hay fever   . Mixed incontinence urge and stress   . Diverticular disease   . Insomnia   . Fibromyalgia     Patient states dx was around her late 48s but she had sx's for years prior to this.  . Syncope     Hypotensive; ED visit--Dr. Terrence Dupont did Cath--nonobstructive CAD, EF 55-60%.  In retrospect, suspect pt rx med misuse/polypharmacy  . Idiopathic angio-edema-urticaria 72014    Angioedema component was very minimal  . Asthma     w/ asbestososis   . History of pneumonia     hospitalized 12/2011, 02/2013, and 07/2013 [facility]) for this  . Anginal pain ([place])     Nonobstructive CAD 2014; however, her cardiologist put her on a statin for this and NOT for hyperlipidemia per pt report.  . OSA on CPAP     prior to move to [place]--had another sleep study 10/2015 w/pulm Dr. Camillo Flaming.  . H/O hiatal hernia   . Migraine syndrome     "not as often anymore; used to be ~ q wk" (07/12/2013)  . Tension headache, chronic   . DDD (degenerative disc disease)     lumbar and cervical.   . Osteoarthritis     "severe; progressing fast" (07/12/2013); multiple joints-not surgical candidate for TKR (03/2015)  . Chronic lower back pain   . Anxiety     panic attacks  . Nephrolithiasis     "passed all on my own or they are  still in there" (07/12/2013)  . Pyelonephritis     "several times over the last 30 yr" (07/12/2013)  . Diastolic congestive heart failure ([place])   . COPD (chronic obstructive pulmonary disease) ([place])   . Pulmonary embolism ([place]) 07/2013    Dx at [facility] with very small peripheral upper lobe pe 07/2013: pt took coumadin for about 8-9 mo  . Pleural plaque with presence of asbestos 07/22/2013  . BPPV (benign paroxysmal positional vertigo) 12/16/2012  . RBBB (right bundle branch block)   . Acute upper GI bleed 06/2014    while  pt taking coumadin, plavix, and meloxicam---despite being told not to take coumadin.  . Iron deficiency anemia     Hematologist in [place], [place] did extensive w/u; no cause found; failed oral supplement;; gets fairly regular (q36m or so) IV iron infusions (Venofer -iron sucrose- 200mg  with procrit.  "for 14 yr I've been getting blood work q month & getting infusions prn" (07/12/2013).  Dr. Marin Olp locally, iron infusions done, EPO deficiency dx'd  . Pernicious anemia 08/24/2014   BP 119/71 mmHg  Pulse 90  SpO2 95%  Opioid Risk Score:   Fall Risk Score:  `1  Depression screen PHQ 2/9  Depression screen [facility] 2/9 08/11/2015 07/12/2015 03/27/2015 03/31/2014  Decreased Interest 3 0 3 0  Down, Depressed, Hopeless 3 1 3  0  PHQ - 2 Score 6 1 6  0  Altered sleeping 2 - 2 -  Tired, decreased energy 3 - 3 -  Change in appetite 0 - 2 -  Feeling bad or failure about yourself  2 - 2 -  Trouble concentrating 0 - 2 -  Moving slowly or fidgety/restless 0 - 0 -  Suicidal thoughts 0 - 0 -  PHQ-9 Score 13 - 17 -     Review of Systems  Constitutional: Positive for diaphoresis.  Respiratory: Positive for cough.   Cardiovascular: Positive for leg swelling.  Gastrointestinal: Positive for nausea.  Genitourinary:       Bladder Control Problems  Musculoskeletal: Positive for gait problem.  Neurological: Positive for dizziness and weakness.  Psychiatric/Behavioral: The patient is nervous/anxious.         Depression  All other systems reviewed and are negative.      Objective:   Physical Exam  Constitutional: She is oriented to person, place, and time. She appears well-developed and well-nourished.  HENT:  Head: Normocephalic and atraumatic.  Neck: Normal range of motion. Neck supple.  Cardiovascular: Normal rate and regular rhythm.   Pulmonary/Chest: Effort normal and breath sounds normal.  Musculoskeletal:  Normal Muscle Bulk and Muscle Testing Reveals: Upper Extremities: Full ROM and Muscle Strength 5/5 Thoracic Paraspinal Tenderness: T-2- T-5 Lumbar Paraspinal Tenderness: L-3- L-5 Lower Extremities: Full ROM and Muscle Strength 5/5 Bilateral Lower Extremities Flexion Produces Pain into Patella Arises from chair slowly using walker for support Antalgic Gait  Neurological: She is alert and oriented to person, place, and time.  Skin: Skin is warm and dry.  Psychiatric: She has a normal mood and affect.  Nursing note and vitals reviewed.         Assessment & Plan:  1. Functional deficits secondary to Gait disorder: Continue with Physical and Occupational Therapy. 2. Chronic Back pain/fibromyalgia/R>L Knee OA Pain Management:  Refilled: Oxycodone 10/325 one tablet every 6 hours #100/ Continue Lyrica and Voltaren Gel  3. Depression with anxiety/Grief reaction/Mood: Continue Trazodone and Cymbalta .  4. [name] with asthma: Oxygen dependent. Albuterol prn.  On Continuous Oxygen Therapy 3 liters/ Pulmonology Following. 5. Bilateral Osteoarthritis of Bilateral Knees/S/P Synvisc Dr. Juanda Bond Following. 6. Fibromyalgia: Continue with exercise regime.  20 minutes of face to face patient care time was spent during this visit. All questions were encouraged and answered.

## 2015-11-15 DIAGNOSIS — M6281 Muscle weakness (generalized): Secondary | ICD-10-CM | POA: Diagnosis not present

## 2015-11-15 DIAGNOSIS — R296 Repeated falls: Secondary | ICD-10-CM | POA: Diagnosis not present

## 2015-11-15 DIAGNOSIS — M25561 Pain in right knee: Secondary | ICD-10-CM | POA: Diagnosis not present

## 2015-11-15 DIAGNOSIS — M25562 Pain in left knee: Secondary | ICD-10-CM | POA: Diagnosis not present

## 2015-11-15 DIAGNOSIS — M545 Low back pain: Secondary | ICD-10-CM | POA: Diagnosis not present

## 2015-11-17 DIAGNOSIS — M6281 Muscle weakness (generalized): Secondary | ICD-10-CM | POA: Diagnosis not present

## 2015-11-17 DIAGNOSIS — R296 Repeated falls: Secondary | ICD-10-CM | POA: Diagnosis not present

## 2015-11-17 DIAGNOSIS — M545 Low back pain: Secondary | ICD-10-CM | POA: Diagnosis not present

## 2015-11-17 DIAGNOSIS — M25561 Pain in right knee: Secondary | ICD-10-CM | POA: Diagnosis not present

## 2015-11-17 DIAGNOSIS — M25562 Pain in left knee: Secondary | ICD-10-CM | POA: Diagnosis not present

## 2015-11-20 DIAGNOSIS — M25562 Pain in left knee: Secondary | ICD-10-CM | POA: Diagnosis not present

## 2015-11-20 DIAGNOSIS — M545 Low back pain: Secondary | ICD-10-CM | POA: Diagnosis not present

## 2015-11-20 DIAGNOSIS — M25561 Pain in right knee: Secondary | ICD-10-CM | POA: Diagnosis not present

## 2015-11-20 DIAGNOSIS — M6281 Muscle weakness (generalized): Secondary | ICD-10-CM | POA: Diagnosis not present

## 2015-11-20 DIAGNOSIS — R296 Repeated falls: Secondary | ICD-10-CM | POA: Diagnosis not present

## 2015-11-21 DIAGNOSIS — R296 Repeated falls: Secondary | ICD-10-CM | POA: Diagnosis not present

## 2015-11-21 DIAGNOSIS — M25562 Pain in left knee: Secondary | ICD-10-CM | POA: Diagnosis not present

## 2015-11-21 DIAGNOSIS — M545 Low back pain: Secondary | ICD-10-CM | POA: Diagnosis not present

## 2015-11-21 DIAGNOSIS — M6281 Muscle weakness (generalized): Secondary | ICD-10-CM | POA: Diagnosis not present

## 2015-11-21 DIAGNOSIS — M25561 Pain in right knee: Secondary | ICD-10-CM | POA: Diagnosis not present

## 2015-11-22 DIAGNOSIS — M25561 Pain in right knee: Secondary | ICD-10-CM | POA: Diagnosis not present

## 2015-11-22 DIAGNOSIS — M6281 Muscle weakness (generalized): Secondary | ICD-10-CM | POA: Diagnosis not present

## 2015-11-22 DIAGNOSIS — M545 Low back pain: Secondary | ICD-10-CM | POA: Diagnosis not present

## 2015-11-22 DIAGNOSIS — M25562 Pain in left knee: Secondary | ICD-10-CM | POA: Diagnosis not present

## 2015-11-22 DIAGNOSIS — R296 Repeated falls: Secondary | ICD-10-CM | POA: Diagnosis not present

## 2015-11-23 DIAGNOSIS — M170 Bilateral primary osteoarthritis of knee: Secondary | ICD-10-CM | POA: Diagnosis not present

## 2015-11-23 DIAGNOSIS — M9905 Segmental and somatic dysfunction of pelvic region: Secondary | ICD-10-CM | POA: Diagnosis not present

## 2015-11-23 DIAGNOSIS — M6281 Muscle weakness (generalized): Secondary | ICD-10-CM | POA: Diagnosis not present

## 2015-11-23 DIAGNOSIS — M25562 Pain in left knee: Secondary | ICD-10-CM | POA: Diagnosis not present

## 2015-11-23 DIAGNOSIS — M4317 Spondylolisthesis, lumbosacral region: Secondary | ICD-10-CM | POA: Diagnosis not present

## 2015-11-23 DIAGNOSIS — R296 Repeated falls: Secondary | ICD-10-CM | POA: Diagnosis not present

## 2015-11-23 DIAGNOSIS — M25551 Pain in right hip: Secondary | ICD-10-CM | POA: Diagnosis not present

## 2015-11-23 DIAGNOSIS — M5136 Other intervertebral disc degeneration, lumbar region: Secondary | ICD-10-CM | POA: Diagnosis not present

## 2015-11-23 DIAGNOSIS — M9904 Segmental and somatic dysfunction of sacral region: Secondary | ICD-10-CM | POA: Diagnosis not present

## 2015-11-23 DIAGNOSIS — M25561 Pain in right knee: Secondary | ICD-10-CM | POA: Diagnosis not present

## 2015-11-23 DIAGNOSIS — M9903 Segmental and somatic dysfunction of lumbar region: Secondary | ICD-10-CM | POA: Diagnosis not present

## 2015-11-23 DIAGNOSIS — M545 Low back pain: Secondary | ICD-10-CM | POA: Diagnosis not present

## 2015-11-24 DIAGNOSIS — M6281 Muscle weakness (generalized): Secondary | ICD-10-CM | POA: Diagnosis not present

## 2015-11-24 DIAGNOSIS — M25561 Pain in right knee: Secondary | ICD-10-CM | POA: Diagnosis not present

## 2015-11-24 DIAGNOSIS — M545 Low back pain: Secondary | ICD-10-CM | POA: Diagnosis not present

## 2015-11-24 DIAGNOSIS — M25562 Pain in left knee: Secondary | ICD-10-CM | POA: Diagnosis not present

## 2015-11-24 DIAGNOSIS — R296 Repeated falls: Secondary | ICD-10-CM | POA: Diagnosis not present

## 2015-11-28 DIAGNOSIS — M25561 Pain in right knee: Secondary | ICD-10-CM | POA: Diagnosis not present

## 2015-11-28 DIAGNOSIS — M6281 Muscle weakness (generalized): Secondary | ICD-10-CM | POA: Diagnosis not present

## 2015-11-28 DIAGNOSIS — M25562 Pain in left knee: Secondary | ICD-10-CM | POA: Diagnosis not present

## 2015-11-28 DIAGNOSIS — R296 Repeated falls: Secondary | ICD-10-CM | POA: Diagnosis not present

## 2015-11-28 DIAGNOSIS — M545 Low back pain: Secondary | ICD-10-CM | POA: Diagnosis not present

## 2015-11-29 DIAGNOSIS — M25562 Pain in left knee: Secondary | ICD-10-CM | POA: Diagnosis not present

## 2015-11-29 DIAGNOSIS — M6281 Muscle weakness (generalized): Secondary | ICD-10-CM | POA: Diagnosis not present

## 2015-11-29 DIAGNOSIS — M545 Low back pain: Secondary | ICD-10-CM | POA: Diagnosis not present

## 2015-11-29 DIAGNOSIS — R296 Repeated falls: Secondary | ICD-10-CM | POA: Diagnosis not present

## 2015-11-29 DIAGNOSIS — M25561 Pain in right knee: Secondary | ICD-10-CM | POA: Diagnosis not present

## 2015-12-01 DIAGNOSIS — M25562 Pain in left knee: Secondary | ICD-10-CM | POA: Diagnosis not present

## 2015-12-01 DIAGNOSIS — M6281 Muscle weakness (generalized): Secondary | ICD-10-CM | POA: Diagnosis not present

## 2015-12-01 DIAGNOSIS — R296 Repeated falls: Secondary | ICD-10-CM | POA: Diagnosis not present

## 2015-12-01 DIAGNOSIS — M545 Low back pain: Secondary | ICD-10-CM | POA: Diagnosis not present

## 2015-12-01 DIAGNOSIS — M25561 Pain in right knee: Secondary | ICD-10-CM | POA: Diagnosis not present

## 2015-12-04 DIAGNOSIS — M545 Low back pain: Secondary | ICD-10-CM | POA: Diagnosis not present

## 2015-12-04 DIAGNOSIS — M6281 Muscle weakness (generalized): Secondary | ICD-10-CM | POA: Diagnosis not present

## 2015-12-04 DIAGNOSIS — R296 Repeated falls: Secondary | ICD-10-CM | POA: Diagnosis not present

## 2015-12-04 DIAGNOSIS — M25561 Pain in right knee: Secondary | ICD-10-CM | POA: Diagnosis not present

## 2015-12-04 DIAGNOSIS — M25562 Pain in left knee: Secondary | ICD-10-CM | POA: Diagnosis not present

## 2015-12-05 DIAGNOSIS — M545 Low back pain: Secondary | ICD-10-CM | POA: Diagnosis not present

## 2015-12-05 DIAGNOSIS — R296 Repeated falls: Secondary | ICD-10-CM | POA: Diagnosis not present

## 2015-12-05 DIAGNOSIS — M6281 Muscle weakness (generalized): Secondary | ICD-10-CM | POA: Diagnosis not present

## 2015-12-05 DIAGNOSIS — M25561 Pain in right knee: Secondary | ICD-10-CM | POA: Diagnosis not present

## 2015-12-05 DIAGNOSIS — M25562 Pain in left knee: Secondary | ICD-10-CM | POA: Diagnosis not present

## 2015-12-06 DIAGNOSIS — M25561 Pain in right knee: Secondary | ICD-10-CM | POA: Diagnosis not present

## 2015-12-06 DIAGNOSIS — M25562 Pain in left knee: Secondary | ICD-10-CM | POA: Diagnosis not present

## 2015-12-06 DIAGNOSIS — R296 Repeated falls: Secondary | ICD-10-CM | POA: Diagnosis not present

## 2015-12-06 DIAGNOSIS — M545 Low back pain: Secondary | ICD-10-CM | POA: Diagnosis not present

## 2015-12-06 DIAGNOSIS — M6281 Muscle weakness (generalized): Secondary | ICD-10-CM | POA: Diagnosis not present

## 2015-12-11 DIAGNOSIS — M25562 Pain in left knee: Secondary | ICD-10-CM | POA: Diagnosis not present

## 2015-12-11 DIAGNOSIS — M25561 Pain in right knee: Secondary | ICD-10-CM | POA: Diagnosis not present

## 2015-12-11 DIAGNOSIS — R296 Repeated falls: Secondary | ICD-10-CM | POA: Diagnosis not present

## 2015-12-11 DIAGNOSIS — M545 Low back pain: Secondary | ICD-10-CM | POA: Diagnosis not present

## 2015-12-11 DIAGNOSIS — M6281 Muscle weakness (generalized): Secondary | ICD-10-CM | POA: Diagnosis not present

## 2015-12-11 DIAGNOSIS — S2241XA Multiple fractures of ribs, right side, initial encounter for closed fracture: Secondary | ICD-10-CM | POA: Diagnosis not present

## 2015-12-11 DIAGNOSIS — S2341XA Sprain of ribs, initial encounter: Secondary | ICD-10-CM | POA: Diagnosis not present

## 2015-12-12 ENCOUNTER — Encounter: Payer: Self-pay | Admitting: Registered Nurse

## 2015-12-12 ENCOUNTER — Other Ambulatory Visit: Payer: Self-pay | Admitting: Family Medicine

## 2015-12-12 ENCOUNTER — Telehealth: Payer: Self-pay | Admitting: *Deleted

## 2015-12-12 ENCOUNTER — Encounter: Payer: Medicare Other | Attending: Physical Medicine & Rehabilitation | Admitting: Registered Nurse

## 2015-12-12 VITALS — BP 132/62 | HR 77 | Resp 14

## 2015-12-12 DIAGNOSIS — M797 Fibromyalgia: Secondary | ICD-10-CM | POA: Diagnosis not present

## 2015-12-12 DIAGNOSIS — M545 Low back pain: Secondary | ICD-10-CM | POA: Diagnosis not present

## 2015-12-12 DIAGNOSIS — Z79899 Other long term (current) drug therapy: Secondary | ICD-10-CM | POA: Diagnosis not present

## 2015-12-12 DIAGNOSIS — G894 Chronic pain syndrome: Secondary | ICD-10-CM

## 2015-12-12 DIAGNOSIS — M1712 Unilateral primary osteoarthritis, left knee: Secondary | ICD-10-CM

## 2015-12-12 DIAGNOSIS — M5440 Lumbago with sciatica, unspecified side: Secondary | ICD-10-CM | POA: Diagnosis not present

## 2015-12-12 DIAGNOSIS — I2510 Atherosclerotic heart disease of native coronary artery without angina pectoris: Secondary | ICD-10-CM | POA: Diagnosis not present

## 2015-12-12 DIAGNOSIS — M25561 Pain in right knee: Secondary | ICD-10-CM | POA: Diagnosis not present

## 2015-12-12 DIAGNOSIS — M6281 Muscle weakness (generalized): Secondary | ICD-10-CM | POA: Diagnosis not present

## 2015-12-12 DIAGNOSIS — R2689 Other abnormalities of gait and mobility: Secondary | ICD-10-CM | POA: Diagnosis not present

## 2015-12-12 DIAGNOSIS — Z5181 Encounter for therapeutic drug level monitoring: Secondary | ICD-10-CM | POA: Diagnosis not present

## 2015-12-12 DIAGNOSIS — R296 Repeated falls: Secondary | ICD-10-CM | POA: Diagnosis not present

## 2015-12-12 DIAGNOSIS — M1711 Unilateral primary osteoarthritis, right knee: Secondary | ICD-10-CM | POA: Insufficient documentation

## 2015-12-12 DIAGNOSIS — M25562 Pain in left knee: Secondary | ICD-10-CM | POA: Diagnosis not present

## 2015-12-12 MED ORDER — OXYCODONE-ACETAMINOPHEN 10-325 MG PO TABS: 1.0000 | ORAL_TABLET | Freq: Four times a day (QID) | ORAL | Status: DC | PRN

## 2015-12-12 NOTE — Telephone Encounter (Signed)
LOV: 10/26/15 NOV: 01/26/16  RF request for Bystolic Last written: 0000000 #30 w/ 6RF  RF request for Isosorbide Last written: 05/03/15 #90 w/ 1RF

## 2015-12-12 NOTE — Telephone Encounter (Signed)
OK to fill this prescription early as per patient request.

## 2015-12-12 NOTE — Telephone Encounter (Signed)
Angie at [facility] was advised and voiced understanding.

## 2015-12-12 NOTE — Progress Notes (Signed)
Subjective:    Patient ID: Linda [name], female    DOB: 1946-11-26, 69 y.o.   MRN: [id number]  HPI: [name] is a 69 year old female who returns for follow up for chronic pain and medication refill. She says her pain is located in her right rib pain, lower back radiating into her right lower extremity laterally and posteriorly and bilateral knees. She rates her pain 7. Her current exercise regime is attending physical and occupational therapy five times day a week and walking. Also states she fell a week ago she was walking in her apartment and tripped over her oxygen tubing and landed on the right side. She was able to pick herself up she went to urgent care on 12/11/15 she has a follow up appointment with Dr.Kendall Orthopedist on 12/13/2015.  Pain Inventory Average Pain 8 Pain Right Now 7 My pain is constant, stabbing and aching  In the last 24 hours, has pain interfered with the following? General activity 9 Relation with others 9 Enjoyment of life 10 What TIME of day is your pain at its worst? all Sleep (in general) Fair  Pain is worse with: walking, bending, standing and some activites Pain improves with: rest, heat/ice and medication Relief from Meds: 5  Mobility walk with assistance use a walker how many minutes can you walk? 10 ability to climb steps?  no do you drive?  yes use a wheelchair transfers alone  Function retired I need assistance with the following:  bathing, meal prep, household duties and shopping Do you have any goals in this area?  yes  Neuro/Psych bladder control problems weakness trouble walking dizziness depression  Prior Studies Any changes since last visit?  no  Physicians involved in your care Any changes since last visit?  no   Family History  Problem Relation Age of Onset  . Arthritis Mother   . Kidney disease Mother   . Heart disease Father   . Stroke Father   . Hypertension Father   . Diabetes Father     Social History   Social History  . Marital Status: Widowed    Spouse Name: N/A  . Number of Children: 2  . Years of Education: N/A   Social History Main Topics  . Smoking status: Never Smoker   . Smokeless tobacco: Never Used     Comment: never used tobacco  . Alcohol Use: No  . Drug Use: No  . Sexual Activity: Not Currently   Other Topics Concern  . None   Social History Narrative   Widowed, 2 sons.  Relocated to [place] 09/2012 to be closer to her son who has MS.   Husband d 2015--mesothelioma.   Occupation: former Pharmacist, hospital.   Education: masters degree level.   No T/A/Ds.   Past Surgical History  Procedure Laterality Date  . Appendectomy  1960  . Total abdominal hysterectomy  1974  . Tendon release  1996    Right forearm and hand  . Knee surgery  2005  . Heel spur surgery Left 2008  . Plantar fascia release Left 2008  . Axillary surgery Left 1978    Multiple "lump" in armpit per pt  . Coccyx removal  1972  . Cardiac catheterization  01/2013    nonobstructive CAD, EF 55-60%  . Transthoracic echocardiogram  01/2013; 04/2014;08/2015    2014--NORMAL.  2015--focal basal septal hypertrophy, EF 55-60%, grade I diast dysfxn, mild LAE.  08/2015 EF 55-60%, nl LV syst fxn, grade  I DD, valves wnl  . Dilation and curettage of uterus  ? 1970's  . Eye surgery Left 2012-2013    "injections for ~ 1 yr; don't really know what for" (07/12/2013)  . Spirometry  04/25/14    In hosp for acute asthma/COPD flare: mixed obstructive and restrictive lung disease. The FEV1 is severely reduced at 45% predicted.  FEV1 signif decreased compared to prior spirometry 07/23/13.  [name] Kitchen Esophagogastroduodenoscopy N/A 07/19/2014    Gastritis found + in the setting of supratherapeutic INR, +plavix, + meloxicam.  . Left heart catheterization with coronary angiogram N/A 01/30/2013    Procedure: LEFT HEART CATHETERIZATION WITH CORONARY ANGIOGRAM;  Surgeon: Clent Demark, MD;  Location: [facility] CATH LAB;  Service: Cardiovascular;   Laterality: N/A;  . Cardiovascular stress test  02/22/15    Low risk myocard perf imaging; wall motion normal, normal EF   Past Medical History  Diagnosis Date  . HTN (hypertension)   . Depression   . Recurrent UTI     hx of hospitalization for pyelonephritis; started abx prophylaxis 06/2015  . Hay fever   . Mixed incontinence urge and stress   . Diverticular disease   . Insomnia   . Fibromyalgia     Patient states dx was around her late 42s but she had sx's for years prior to this.  . Syncope     Hypotensive; ED visit--Dr. Terrence Dupont did Cath--nonobstructive CAD, EF 55-60%.  In retrospect, suspect pt rx med misuse/polypharmacy  . Idiopathic angio-edema-urticaria 72014    Angioedema component was very minimal  . Asthma     w/ asbestososis   . History of pneumonia     hospitalized 12/2011, 02/2013, and 07/2013 [facility]) for this  . Anginal pain ([place])     Nonobstructive CAD 2014; however, her cardiologist put her on a statin for this and NOT for hyperlipidemia per pt report.  . OSA on CPAP     prior to move to [place]--had another sleep study 10/2015 w/pulm Dr. Camillo Flaming.  . H/O hiatal hernia   . Migraine syndrome     "not as often anymore; used to be ~ q wk" (07/12/2013)  . Tension headache, chronic   . DDD (degenerative disc disease)     lumbar and cervical.   . Osteoarthritis     "severe; progressing fast" (07/12/2013); multiple joints-not surgical candidate for TKR (03/2015)  . Chronic lower back pain   . Anxiety     panic attacks  . Nephrolithiasis     "passed all on my own or they are still in there" (07/12/2013)  . Pyelonephritis     "several times over the last 30 yr" (07/12/2013)  . Diastolic congestive heart failure ([place])   . COPD (chronic obstructive pulmonary disease) ([place])   . Pulmonary embolism ([place]) 07/2013    Dx at [facility] with very small peripheral upper lobe pe 07/2013: pt took coumadin for about 8-9 mo  . Pleural plaque with presence of asbestos 07/22/2013  . BPPV (benign paroxysmal  positional vertigo) 12/16/2012  . RBBB (right bundle branch block)   . Acute upper GI bleed 06/2014    while pt taking coumadin, plavix, and meloxicam---despite being told not to take coumadin.  . Iron deficiency anemia     Hematologist in [place], [place] did extensive w/u; no cause found; failed oral supplement;; gets fairly regular (q19m or so) IV iron infusions (Venofer -iron sucrose- 200mg  with procrit.  "for 14 yr I've been getting blood work q month & getting infusions  prn" (07/12/2013).  Dr. Marin Olp locally, iron infusions done, EPO deficiency dx'd  . Pernicious anemia 08/24/2014   BP 132/62 mmHg  Pulse 77  Resp 14  SpO2 96%  Opioid Risk Score:   Fall Risk Score:  `1  Depression screen PHQ 2/9  Depression screen [facility] 2/9 08/11/2015 07/12/2015 03/27/2015 03/31/2014  Decreased Interest 3 0 3 0  Down, Depressed, Hopeless 3 1 3  0  PHQ - 2 Score 6 1 6  0  Altered sleeping 2 - 2 -  Tired, decreased energy 3 - 3 -  Change in appetite 0 - 2 -  Feeling bad or failure about yourself  2 - 2 -  Trouble concentrating 0 - 2 -  Moving slowly or fidgety/restless 0 - 0 -  Suicidal thoughts 0 - 0 -  PHQ-9 Score 13 - 17 -     Review of Systems  Constitutional: Positive for diaphoresis and unexpected weight change.  Respiratory: Positive for cough and shortness of breath.   Cardiovascular: Positive for leg swelling.  Gastrointestinal: Positive for nausea.  Musculoskeletal: Positive for gait problem.  Neurological: Positive for dizziness and weakness.  Psychiatric/Behavioral: Positive for dysphoric mood.       Objective:   Physical Exam  Constitutional: She is oriented to person, place, and time. She appears well-developed and well-nourished.  HENT:  Head: Normocephalic and atraumatic.  Neck: Normal range of motion. Neck supple.  Cardiovascular: Normal rate and regular rhythm.   Pulmonary/Chest: Effort normal and breath sounds normal.  Continuous Oxygen 3 liters nasal cannula    Musculoskeletal:  Normal Muscle Bulk and Muscle Testing Reveals: Upper Extremities: Full ROM and Muscle Strength 5/5 Right Rib Pain T-7- T-9 Lumbar Paraspinal Tenderness: L-3-L-5 Lower Extremities: Full ROM and Muscle Strength 5/5 Bilateral Lower Extremity Flexion Produces Pain into Patella's Arrived in wheelchair  Neurological: She is alert and oriented to person, place, and time.  Skin: Skin is warm and dry.  Psychiatric: She has a normal mood and affect.  Nursing note and vitals reviewed.         Assessment & Plan:  1. Functional deficits secondary to Gait disorder: Continue with Physical and Occupational Therapy. 2. Chronic Back pain/fibromyalgia/R>L Knee OA Pain Management:  Refilled: Oxycodone 10/325 one tablet every 6 hours #100/ Postdate fill date. Continue Lyrica and Voltaren Gel  3. Depression with anxiety/Grief reaction/Mood: Continue Trazodone and Cymbalta .  4. [name] with asthma: Oxygen dependent. Albuterol prn.  On Continuous Oxygen Therapy 3 liters/ Pulmonology Following. 5. Bilateral Osteoarthritis of Bilateral Knees: Dr. Juanda Bond Following. 6. Fibromyalgia: Continue with exercise regime.  20 minutes of face to face patient care time was spent during this visit. All questions were encouraged and answered.

## 2015-12-12 NOTE — Telephone Encounter (Signed)
Kim with [facility] on 12/12/15 at 2:21pm stating that pt would like to get her Rx for alprazolam early. She stated that is leaving to go out of town on 12/18/15 and would like to p/u Rx on 12/15/15. She stated that pt last filled Rx on 11/27/15. She wants to know if it is okay to fill 15 days early. Please advise. Thanks.

## 2015-12-13 DIAGNOSIS — M25562 Pain in left knee: Secondary | ICD-10-CM | POA: Diagnosis not present

## 2015-12-13 DIAGNOSIS — M6281 Muscle weakness (generalized): Secondary | ICD-10-CM | POA: Diagnosis not present

## 2015-12-13 DIAGNOSIS — R296 Repeated falls: Secondary | ICD-10-CM | POA: Diagnosis not present

## 2015-12-13 DIAGNOSIS — S2231XA Fracture of one rib, right side, initial encounter for closed fracture: Secondary | ICD-10-CM | POA: Diagnosis not present

## 2015-12-13 DIAGNOSIS — M545 Low back pain: Secondary | ICD-10-CM | POA: Diagnosis not present

## 2015-12-13 DIAGNOSIS — R0789 Other chest pain: Secondary | ICD-10-CM | POA: Diagnosis not present

## 2015-12-13 DIAGNOSIS — M25561 Pain in right knee: Secondary | ICD-10-CM | POA: Diagnosis not present

## 2015-12-14 DIAGNOSIS — M25561 Pain in right knee: Secondary | ICD-10-CM | POA: Diagnosis not present

## 2015-12-14 DIAGNOSIS — R296 Repeated falls: Secondary | ICD-10-CM | POA: Diagnosis not present

## 2015-12-14 DIAGNOSIS — M25562 Pain in left knee: Secondary | ICD-10-CM | POA: Diagnosis not present

## 2015-12-14 DIAGNOSIS — M6281 Muscle weakness (generalized): Secondary | ICD-10-CM | POA: Diagnosis not present

## 2015-12-14 DIAGNOSIS — M545 Low back pain: Secondary | ICD-10-CM | POA: Diagnosis not present

## 2015-12-20 DIAGNOSIS — M25561 Pain in right knee: Secondary | ICD-10-CM | POA: Diagnosis not present

## 2015-12-20 DIAGNOSIS — M25562 Pain in left knee: Secondary | ICD-10-CM | POA: Diagnosis not present

## 2015-12-20 DIAGNOSIS — M545 Low back pain: Secondary | ICD-10-CM | POA: Diagnosis not present

## 2015-12-20 DIAGNOSIS — M6281 Muscle weakness (generalized): Secondary | ICD-10-CM | POA: Diagnosis not present

## 2015-12-20 DIAGNOSIS — R296 Repeated falls: Secondary | ICD-10-CM | POA: Diagnosis not present

## 2015-12-22 DIAGNOSIS — M6281 Muscle weakness (generalized): Secondary | ICD-10-CM | POA: Diagnosis not present

## 2015-12-22 DIAGNOSIS — M25562 Pain in left knee: Secondary | ICD-10-CM | POA: Diagnosis not present

## 2015-12-22 DIAGNOSIS — R296 Repeated falls: Secondary | ICD-10-CM | POA: Diagnosis not present

## 2015-12-22 DIAGNOSIS — M25561 Pain in right knee: Secondary | ICD-10-CM | POA: Diagnosis not present

## 2015-12-22 DIAGNOSIS — M545 Low back pain: Secondary | ICD-10-CM | POA: Diagnosis not present

## 2015-12-26 DIAGNOSIS — R296 Repeated falls: Secondary | ICD-10-CM | POA: Diagnosis not present

## 2015-12-26 DIAGNOSIS — M6281 Muscle weakness (generalized): Secondary | ICD-10-CM | POA: Diagnosis not present

## 2015-12-27 DIAGNOSIS — M6281 Muscle weakness (generalized): Secondary | ICD-10-CM | POA: Diagnosis not present

## 2015-12-27 DIAGNOSIS — R296 Repeated falls: Secondary | ICD-10-CM | POA: Diagnosis not present

## 2015-12-28 DIAGNOSIS — M6281 Muscle weakness (generalized): Secondary | ICD-10-CM | POA: Diagnosis not present

## 2015-12-28 DIAGNOSIS — R296 Repeated falls: Secondary | ICD-10-CM | POA: Diagnosis not present

## 2016-01-01 ENCOUNTER — Inpatient Hospital Stay (HOSPITAL_COMMUNITY): Payer: Medicare Other

## 2016-01-01 ENCOUNTER — Inpatient Hospital Stay (HOSPITAL_COMMUNITY)
Admission: EM | Admit: 2016-01-01 | Discharge: 2016-01-03 | DRG: 190 | Disposition: A | Discharge status: Home Health | Payer: Medicare Other | Attending: Internal Medicine | Admitting: Internal Medicine

## 2016-01-01 ENCOUNTER — Encounter (HOSPITAL_COMMUNITY): Payer: Self-pay | Admitting: *Deleted

## 2016-01-01 DIAGNOSIS — F410 Panic disorder [episodic paroxysmal anxiety] without agoraphobia: Secondary | ICD-10-CM | POA: Diagnosis present

## 2016-01-01 DIAGNOSIS — M549 Dorsalgia, unspecified: Secondary | ICD-10-CM | POA: Diagnosis not present

## 2016-01-01 DIAGNOSIS — W1839XA Other fall on same level, initial encounter: Secondary | ICD-10-CM | POA: Diagnosis present

## 2016-01-01 DIAGNOSIS — G9341 Metabolic encephalopathy: Secondary | ICD-10-CM | POA: Diagnosis present

## 2016-01-01 DIAGNOSIS — R2681 Unsteadiness on feet: Secondary | ICD-10-CM

## 2016-01-01 DIAGNOSIS — S299XXA Unspecified injury of thorax, initial encounter: Secondary | ICD-10-CM | POA: Diagnosis not present

## 2016-01-01 DIAGNOSIS — J8410 Pulmonary fibrosis, unspecified: Secondary | ICD-10-CM | POA: Diagnosis present

## 2016-01-01 DIAGNOSIS — I2510 Atherosclerotic heart disease of native coronary artery without angina pectoris: Secondary | ICD-10-CM | POA: Diagnosis present

## 2016-01-01 DIAGNOSIS — Z90710 Acquired absence of both cervix and uterus: Secondary | ICD-10-CM

## 2016-01-01 DIAGNOSIS — D509 Iron deficiency anemia, unspecified: Secondary | ICD-10-CM | POA: Diagnosis present

## 2016-01-01 DIAGNOSIS — Z880 Allergy status to penicillin: Secondary | ICD-10-CM

## 2016-01-01 DIAGNOSIS — Z87440 Personal history of urinary (tract) infections: Secondary | ICD-10-CM | POA: Diagnosis not present

## 2016-01-01 DIAGNOSIS — J189 Pneumonia, unspecified organism: Secondary | ICD-10-CM | POA: Diagnosis present

## 2016-01-01 DIAGNOSIS — Y92000 Kitchen of unspecified non-institutional (private) residence as  the place of occurrence of the external cause: Secondary | ICD-10-CM | POA: Diagnosis not present

## 2016-01-01 DIAGNOSIS — R8271 Bacteriuria: Secondary | ICD-10-CM

## 2016-01-01 DIAGNOSIS — R531 Weakness: Secondary | ICD-10-CM | POA: Diagnosis not present

## 2016-01-01 DIAGNOSIS — N390 Urinary tract infection, site not specified: Secondary | ICD-10-CM | POA: Diagnosis not present

## 2016-01-01 DIAGNOSIS — J45909 Unspecified asthma, uncomplicated: Secondary | ICD-10-CM | POA: Diagnosis present

## 2016-01-01 DIAGNOSIS — D510 Vitamin B12 deficiency anemia due to intrinsic factor deficiency: Secondary | ICD-10-CM

## 2016-01-01 DIAGNOSIS — R05 Cough: Secondary | ICD-10-CM | POA: Diagnosis not present

## 2016-01-01 DIAGNOSIS — R0600 Dyspnea, unspecified: Secondary | ICD-10-CM

## 2016-01-01 DIAGNOSIS — M790 Rheumatism, unspecified: Secondary | ICD-10-CM | POA: Diagnosis present

## 2016-01-01 DIAGNOSIS — M1711 Unilateral primary osteoarthritis, right knee: Secondary | ICD-10-CM

## 2016-01-01 DIAGNOSIS — F419 Anxiety disorder, unspecified: Secondary | ICD-10-CM | POA: Diagnosis present

## 2016-01-01 DIAGNOSIS — Z841 Family history of disorders of kidney and ureter: Secondary | ICD-10-CM | POA: Diagnosis not present

## 2016-01-01 DIAGNOSIS — J61 Pneumoconiosis due to asbestos and other mineral fibers: Secondary | ICD-10-CM | POA: Diagnosis present

## 2016-01-01 DIAGNOSIS — Z9981 Dependence on supplemental oxygen: Secondary | ICD-10-CM | POA: Diagnosis not present

## 2016-01-01 DIAGNOSIS — R296 Repeated falls: Secondary | ICD-10-CM | POA: Diagnosis not present

## 2016-01-01 DIAGNOSIS — R29898 Other symptoms and signs involving the musculoskeletal system: Secondary | ICD-10-CM

## 2016-01-01 DIAGNOSIS — J440 Chronic obstructive pulmonary disease with acute lower respiratory infection: Principal | ICD-10-CM | POA: Diagnosis present

## 2016-01-01 DIAGNOSIS — Z7982 Long term (current) use of aspirin: Secondary | ICD-10-CM | POA: Diagnosis not present

## 2016-01-01 DIAGNOSIS — G894 Chronic pain syndrome: Secondary | ICD-10-CM | POA: Diagnosis present

## 2016-01-01 DIAGNOSIS — K449 Diaphragmatic hernia without obstruction or gangrene: Secondary | ICD-10-CM | POA: Diagnosis present

## 2016-01-01 DIAGNOSIS — F411 Generalized anxiety disorder: Secondary | ICD-10-CM | POA: Diagnosis present

## 2016-01-01 DIAGNOSIS — Z833 Family history of diabetes mellitus: Secondary | ICD-10-CM

## 2016-01-01 DIAGNOSIS — E876 Hypokalemia: Secondary | ICD-10-CM | POA: Diagnosis present

## 2016-01-01 DIAGNOSIS — Z823 Family history of stroke: Secondary | ICD-10-CM | POA: Diagnosis not present

## 2016-01-01 DIAGNOSIS — M797 Fibromyalgia: Secondary | ICD-10-CM | POA: Diagnosis present

## 2016-01-01 DIAGNOSIS — Z87442 Personal history of urinary calculi: Secondary | ICD-10-CM | POA: Diagnosis not present

## 2016-01-01 DIAGNOSIS — I10 Essential (primary) hypertension: Secondary | ICD-10-CM | POA: Diagnosis not present

## 2016-01-01 DIAGNOSIS — Z8249 Family history of ischemic heart disease and other diseases of the circulatory system: Secondary | ICD-10-CM | POA: Diagnosis not present

## 2016-01-01 DIAGNOSIS — Z86711 Personal history of pulmonary embolism: Secondary | ICD-10-CM

## 2016-01-01 DIAGNOSIS — E860 Dehydration: Secondary | ICD-10-CM | POA: Diagnosis present

## 2016-01-01 DIAGNOSIS — M545 Low back pain: Secondary | ICD-10-CM | POA: Diagnosis present

## 2016-01-01 DIAGNOSIS — T50995A Adverse effect of other drugs, medicaments and biological substances, initial encounter: Secondary | ICD-10-CM | POA: Diagnosis present

## 2016-01-01 DIAGNOSIS — J9610 Chronic respiratory failure, unspecified whether with hypoxia or hypercapnia: Secondary | ICD-10-CM | POA: Diagnosis present

## 2016-01-01 DIAGNOSIS — Z8701 Personal history of pneumonia (recurrent): Secondary | ICD-10-CM | POA: Diagnosis not present

## 2016-01-01 DIAGNOSIS — I4510 Unspecified right bundle-branch block: Secondary | ICD-10-CM | POA: Diagnosis present

## 2016-01-01 DIAGNOSIS — Z79899 Other long term (current) drug therapy: Secondary | ICD-10-CM

## 2016-01-01 DIAGNOSIS — G4733 Obstructive sleep apnea (adult) (pediatric): Secondary | ICD-10-CM | POA: Diagnosis present

## 2016-01-01 DIAGNOSIS — R059 Cough, unspecified: Secondary | ICD-10-CM

## 2016-01-01 DIAGNOSIS — Y92009 Unspecified place in unspecified non-institutional (private) residence as the place of occurrence of the external cause: Secondary | ICD-10-CM

## 2016-01-01 DIAGNOSIS — M6281 Muscle weakness (generalized): Secondary | ICD-10-CM | POA: Diagnosis not present

## 2016-01-01 DIAGNOSIS — R51 Headache: Secondary | ICD-10-CM | POA: Diagnosis not present

## 2016-01-01 DIAGNOSIS — M6282 Rhabdomyolysis: Secondary | ICD-10-CM | POA: Diagnosis present

## 2016-01-01 DIAGNOSIS — F339 Major depressive disorder, recurrent, unspecified: Secondary | ICD-10-CM | POA: Diagnosis present

## 2016-01-01 DIAGNOSIS — F332 Major depressive disorder, recurrent severe without psychotic features: Secondary | ICD-10-CM | POA: Diagnosis present

## 2016-01-01 DIAGNOSIS — I5032 Chronic diastolic (congestive) heart failure: Secondary | ICD-10-CM | POA: Diagnosis present

## 2016-01-01 DIAGNOSIS — M1990 Unspecified osteoarthritis, unspecified site: Secondary | ICD-10-CM | POA: Diagnosis present

## 2016-01-01 DIAGNOSIS — J929 Pleural plaque without asbestos: Secondary | ICD-10-CM | POA: Diagnosis not present

## 2016-01-01 DIAGNOSIS — W19XXXA Unspecified fall, initial encounter: Secondary | ICD-10-CM

## 2016-01-01 DIAGNOSIS — R918 Other nonspecific abnormal finding of lung field: Secondary | ICD-10-CM | POA: Diagnosis not present

## 2016-01-01 DIAGNOSIS — S0990XA Unspecified injury of head, initial encounter: Secondary | ICD-10-CM | POA: Diagnosis not present

## 2016-01-01 LAB — COMPREHENSIVE METABOLIC PANEL
ALBUMIN: 4.2 g/dL (ref 3.5–5.0)
ALT: 15 U/L (ref 14–54)
AST: 23 U/L (ref 15–41)
Alkaline Phosphatase: 100 U/L (ref 38–126)
Anion gap: 12 (ref 5–15)
BILIRUBIN TOTAL: 0.9 mg/dL (ref 0.3–1.2)
BUN: 7 mg/dL (ref 6–20)
CHLORIDE: 101 mmol/L (ref 101–111)
CO2: 31 mmol/L (ref 22–32)
Calcium: 9.4 mg/dL (ref 8.9–10.3)
Creatinine, Ser: 0.62 mg/dL (ref 0.44–1.00)
GFR calc Af Amer: >60 mL/min (ref >60)
GFR calc non Af Amer: >60 mL/min (ref >60)
GLUCOSE: 118 mg/dL — AB (ref 65–99)
POTASSIUM: 2.8 mmol/L — AB (ref 3.5–5.1)
SODIUM: 144 mmol/L (ref 135–145)
TOTAL PROTEIN: 7.8 g/dL (ref 6.5–8.1)

## 2016-01-01 LAB — URINALYSIS, ROUTINE W REFLEX MICROSCOPIC
Bilirubin Urine: NEGATIVE
Glucose, UA: NEGATIVE mg/dL
Ketones, ur: NEGATIVE mg/dL
NITRITE: NEGATIVE
PROTEIN: NEGATIVE mg/dL
Specific Gravity, Urine: 1.008 (ref 1.005–1.030)
pH: 6.5 (ref 5.0–8.0)

## 2016-01-01 LAB — CK: Total CK: 880 U/L — ABNORMAL HIGH (ref 38–234)

## 2016-01-01 LAB — CBC
HEMATOCRIT: 41.3 % (ref 36.0–46.0)
Hemoglobin: 14.2 g/dL (ref 12.0–15.0)
MCH: 30.1 pg (ref 26.0–34.0)
MCHC: 34.4 g/dL (ref 30.0–36.0)
MCV: 87.5 fL (ref 78.0–100.0)
Platelets: 183 10*3/uL (ref 150–400)
RBC: 4.72 MIL/uL (ref 3.87–5.11)
RDW: 12.6 % (ref 11.5–15.5)
WBC: 7.4 10*3/uL (ref 4.0–10.5)

## 2016-01-01 LAB — URINE MICROSCOPIC-ADD ON

## 2016-01-01 MED ORDER — PANTOPRAZOLE SODIUM 40 MG PO TBEC
40.0000 mg | DELAYED_RELEASE_TABLET | Freq: Every day | ORAL | Status: DC
  Administered 2016-01-02 – 2016-01-03 (×2): 40 mg via ORAL
  Filled 2016-01-01 (×2): qty 1

## 2016-01-01 MED ORDER — SODIUM CHLORIDE 0.9 % IV SOLN
INTRAVENOUS | Status: AC
  Administered 2016-01-01: 17:00:00 via INTRAVENOUS

## 2016-01-01 MED ORDER — KCL IN DEXTROSE-NACL 20-5-0.9 MEQ/L-%-% IV SOLN
INTRAVENOUS | Status: DC
  Administered 2016-01-01 – 2016-01-03 (×3): via INTRAVENOUS
  Filled 2016-01-01 (×5): qty 1000

## 2016-01-01 MED ORDER — ARIPIPRAZOLE 5 MG PO TABS
5.0000 mg | ORAL_TABLET | Freq: Every day | ORAL | Status: DC
  Administered 2016-01-02 – 2016-01-03 (×3): 5 mg via ORAL
  Filled 2016-01-01 (×3): qty 1

## 2016-01-01 MED ORDER — PREDNISONE 20 MG PO TABS
20.0000 mg | ORAL_TABLET | Freq: Every day | ORAL | Status: DC
  Administered 2016-01-02 – 2016-01-03 (×3): 20 mg via ORAL
  Filled 2016-01-01 (×3): qty 1

## 2016-01-01 MED ORDER — SODIUM CHLORIDE 0.9 % IV SOLN: Freq: Once | INTRAVENOUS | Status: DC

## 2016-01-01 MED ORDER — POTASSIUM CHLORIDE 10 MEQ/100ML IV SOLN
10.0000 meq | Freq: Once | INTRAVENOUS | Status: AC
  Administered 2016-01-01: 10 meq via INTRAVENOUS
  Filled 2016-01-01: qty 100

## 2016-01-01 MED ORDER — ACETAMINOPHEN 325 MG PO TABS
650.0000 mg | ORAL_TABLET | Freq: Four times a day (QID) | ORAL | Status: DC | PRN
Start: 2016-01-01 — End: 2016-01-03

## 2016-01-01 MED ORDER — HYDROCODONE-ACETAMINOPHEN 5-325 MG PO TABS
1.0000 | ORAL_TABLET | ORAL | Status: DC | PRN
  Administered 2016-01-02 – 2016-01-03 (×6): 2 via ORAL
  Filled 2016-01-01 (×5): qty 2
  Filled 2016-01-01: qty 1
  Filled 2016-01-01: qty 2

## 2016-01-01 MED ORDER — CEPHALEXIN 500 MG PO CAPS: 500.0000 mg | ORAL_CAPSULE | Freq: Three times a day (TID) | ORAL | Status: DC

## 2016-01-01 MED ORDER — POLYVINYL ALCOHOL 1.4 % OP SOLN
1.0000 [drp] | Freq: Every day | OPHTHALMIC | Status: DC
  Administered 2016-01-02 – 2016-01-03 (×3): 1 [drp] via OPHTHALMIC
  Filled 2016-01-01: qty 15

## 2016-01-01 MED ORDER — POTASSIUM CHLORIDE CRYS ER 20 MEQ PO TBCR
40.0000 meq | EXTENDED_RELEASE_TABLET | Freq: Every day | ORAL | Status: DC
  Administered 2016-01-02 – 2016-01-03 (×3): 40 meq via ORAL
  Filled 2016-01-01 (×3): qty 2

## 2016-01-01 MED ORDER — ACETAMINOPHEN 650 MG RE SUPP: 650.0000 mg | Freq: Four times a day (QID) | RECTAL | Status: DC | PRN

## 2016-01-01 MED ORDER — OSELTAMIVIR PHOSPHATE 75 MG PO CAPS
75.0000 mg | ORAL_CAPSULE | Freq: Two times a day (BID) | ORAL | Status: DC
  Administered 2016-01-01: 75 mg via ORAL
  Filled 2016-01-01 (×3): qty 1

## 2016-01-01 MED ORDER — POTASSIUM CHLORIDE CRYS ER 20 MEQ PO TBCR: 20.0000 meq | EXTENDED_RELEASE_TABLET | Freq: Every day | ORAL | Status: DC

## 2016-01-01 MED ORDER — ENOXAPARIN SODIUM 40 MG/0.4ML ~~LOC~~ SOLN
40.0000 mg | SUBCUTANEOUS | Status: DC
  Administered 2016-01-02 (×2): 40 mg via SUBCUTANEOUS
  Filled 2016-01-01 (×3): qty 0.4

## 2016-01-01 MED ORDER — ALBUTEROL SULFATE (2.5 MG/3ML) 0.083% IN NEBU
2.5000 mg | INHALATION_SOLUTION | RESPIRATORY_TRACT | Status: DC | PRN
Start: 2016-01-01 — End: 2016-01-03

## 2016-01-01 MED ORDER — ONDANSETRON HCL 4 MG/2ML IJ SOLN
4.0000 mg | Freq: Four times a day (QID) | INTRAMUSCULAR | Status: DC | PRN
  Administered 2016-01-01 – 2016-01-03 (×3): 4 mg via INTRAVENOUS
  Filled 2016-01-01 (×3): qty 2

## 2016-01-01 MED ORDER — SODIUM CHLORIDE 0.9 % IV SOLN
Freq: Once | INTRAVENOUS | Status: AC
  Administered 2016-01-01: 13:00:00 via INTRAVENOUS

## 2016-01-01 MED ORDER — POTASSIUM CHLORIDE CRYS ER 20 MEQ PO TBCR
40.0000 meq | EXTENDED_RELEASE_TABLET | Freq: Once | ORAL | Status: AC
  Administered 2016-01-01: 40 meq via ORAL
  Filled 2016-01-01: qty 2

## 2016-01-01 MED ORDER — FOLIC ACID 1 MG PO TABS
2.0000 mg | ORAL_TABLET | Freq: Every day | ORAL | Status: DC
  Administered 2016-01-02 – 2016-01-03 (×3): 2 mg via ORAL
  Filled 2016-01-01 (×3): qty 2

## 2016-01-01 MED ORDER — ISOSORBIDE MONONITRATE ER 30 MG PO TB24
30.0000 mg | ORAL_TABLET | Freq: Every day | ORAL | Status: DC
  Administered 2016-01-02 – 2016-01-03 (×3): 30 mg via ORAL
  Filled 2016-01-01 (×3): qty 1

## 2016-01-01 MED ORDER — LEVOFLOXACIN IN D5W 750 MG/150ML IV SOLN
750.0000 mg | INTRAVENOUS | Status: DC
  Administered 2016-01-01 – 2016-01-02 (×2): 750 mg via INTRAVENOUS
  Filled 2016-01-01 (×2): qty 150

## 2016-01-01 MED ORDER — MORPHINE SULFATE (PF) 2 MG/ML IV SOLN
2.0000 mg | INTRAVENOUS | Status: DC | PRN
  Administered 2016-01-01 – 2016-01-02 (×2): 2 mg via INTRAVENOUS
  Filled 2016-01-01 (×2): qty 1

## 2016-01-01 MED ORDER — DICLOFENAC SODIUM 1 % TD GEL
4.0000 g | Freq: Every day | TRANSDERMAL | Status: DC | PRN
  Filled 2016-01-01: qty 100

## 2016-01-01 MED ORDER — NEBIVOLOL HCL 5 MG PO TABS
5.0000 mg | ORAL_TABLET | Freq: Every day | ORAL | Status: DC
  Administered 2016-01-02 – 2016-01-03 (×3): 5 mg via ORAL
  Filled 2016-01-01 (×3): qty 1

## 2016-01-01 MED ORDER — MORPHINE SULFATE (PF) 4 MG/ML IV SOLN
4.0000 mg | Freq: Once | INTRAVENOUS | Status: AC
  Administered 2016-01-01: 4 mg via INTRAVENOUS
  Filled 2016-01-01: qty 1

## 2016-01-01 MED ORDER — GUAIFENESIN ER 600 MG PO TB12
600.0000 mg | ORAL_TABLET | Freq: Two times a day (BID) | ORAL | Status: DC
  Administered 2016-01-01 – 2016-01-03 (×4): 600 mg via ORAL
  Filled 2016-01-01 (×5): qty 1

## 2016-01-01 MED ORDER — ALUM & MAG HYDROXIDE-SIMETH 200-200-20 MG/5ML PO SUSP: 30.0000 mL | Freq: Four times a day (QID) | ORAL | Status: DC | PRN

## 2016-01-01 MED ORDER — ONDANSETRON HCL 4 MG PO TABS: 4.0000 mg | ORAL_TABLET | Freq: Four times a day (QID) | ORAL | Status: DC | PRN

## 2016-01-01 MED ORDER — ALBUTEROL SULFATE HFA 108 (90 BASE) MCG/ACT IN AERS: 2.0000 | INHALATION_SPRAY | Freq: Four times a day (QID) | RESPIRATORY_TRACT | Status: DC | PRN

## 2016-01-01 MED ORDER — FUROSEMIDE 20 MG PO TABS
20.0000 mg | ORAL_TABLET | Freq: Every day | ORAL | Status: DC
  Administered 2016-01-02 – 2016-01-03 (×2): 20 mg via ORAL
  Filled 2016-01-01 (×2): qty 1

## 2016-01-01 MED ORDER — ASPIRIN EC 81 MG PO TBEC
81.0000 mg | DELAYED_RELEASE_TABLET | Freq: Every day | ORAL | Status: DC
  Administered 2016-01-02 – 2016-01-03 (×3): 81 mg via ORAL
  Filled 2016-01-01 (×3): qty 1

## 2016-01-01 MED ORDER — ONDANSETRON HCL 4 MG/2ML IJ SOLN
4.0000 mg | Freq: Once | INTRAMUSCULAR | Status: AC
  Administered 2016-01-01: 4 mg via INTRAVENOUS
  Filled 2016-01-01: qty 2

## 2016-01-01 MED ORDER — DULOXETINE HCL 30 MG PO CPEP
30.0000 mg | ORAL_CAPSULE | Freq: Every day | ORAL | Status: DC
  Administered 2016-01-02 – 2016-01-03 (×3): 30 mg via ORAL
  Filled 2016-01-01 (×3): qty 1

## 2016-01-01 MED ORDER — BUDESONIDE-FORMOTEROL FUMARATE 160-4.5 MCG/ACT IN AERO
2.0000 | INHALATION_SPRAY | Freq: Two times a day (BID) | RESPIRATORY_TRACT | Status: DC
  Administered 2016-01-02 – 2016-01-03 (×4): 2 via RESPIRATORY_TRACT
  Filled 2016-01-01: qty 6

## 2016-01-01 MED ORDER — PREGABALIN 75 MG PO CAPS
300.0000 mg | ORAL_CAPSULE | Freq: Every day | ORAL | Status: DC
  Administered 2016-01-02 – 2016-01-03 (×3): 300 mg via ORAL
  Filled 2016-01-01 (×3): qty 4

## 2016-01-01 MED ORDER — NITROGLYCERIN 0.4 MG SL SUBL: 0.4000 mg | SUBLINGUAL_TABLET | SUBLINGUAL | Status: DC | PRN

## 2016-01-01 MED ORDER — ALPRAZOLAM 0.5 MG PO TABS
0.5000 mg | ORAL_TABLET | Freq: Two times a day (BID) | ORAL | Status: DC | PRN
  Administered 2016-01-02 – 2016-01-03 (×5): 0.5 mg via ORAL
  Filled 2016-01-01: qty 1
  Filled 2016-01-01 (×4): qty 2

## 2016-01-01 MED ORDER — HYDROCODONE-ACETAMINOPHEN 5-325 MG PO TABS
2.0000 | ORAL_TABLET | Freq: Once | ORAL | Status: AC
  Administered 2016-01-01: 2 via ORAL
  Filled 2016-01-01: qty 2

## 2016-01-01 MED ORDER — POLYETHYLENE GLYCOL 3350 17 G PO PACK
17.0000 g | PACK | Freq: Every day | ORAL | Status: DC
  Administered 2016-01-02 – 2016-01-03 (×2): 17 g via ORAL
  Filled 2016-01-01 (×2): qty 1

## 2016-01-01 MED ORDER — ADULT MULTIVITAMIN W/MINERALS CH
1.0000 | ORAL_TABLET | Freq: Every day | ORAL | Status: DC
  Administered 2016-01-02 – 2016-01-03 (×3): 1 via ORAL
  Filled 2016-01-01 (×3): qty 1

## 2016-01-01 MED ORDER — DEXTROSE 5 % IV SOLN
1.0000 g | Freq: Once | INTRAVENOUS | Status: AC
  Administered 2016-01-01: 1 g via INTRAVENOUS
  Filled 2016-01-01: qty 10

## 2016-01-01 NOTE — Progress Notes (Signed)
ED CM spoke with pt EDP Steinla to get clarity on Cm consult  EDP states pt may need a higher level of care Pt from [facility]

## 2016-01-01 NOTE — ED Provider Notes (Addendum)
CSN: [id number]     Arrival date & time 01/01/16  50 History   First MD Initiated Contact with Patient 01/01/16 1053     Chief Complaint  Patient presents with  . Fall     (Consider location/radiation/quality/duration/timing/severity/associated sxs/prior Treatment) Patient is a 70 y.o. female presenting with fall. The history is provided by the patient and the EMS personnel.  Fall Pertinent negatives include no chest pain, no abdominal pain, no headaches and no shortness of breath.  Patient presents from [facility], s/p fall at home. States she was in kitchen fixing herself some food, when she went to sit on her walker, and states she didn't sit far enough back on it, which caused her to then slide down to ground. Pt denies specific new pain or injury. States hx chronic back and knee pain. No new pain or numbness/weakness. Denies faintness or dizziness prior to fall. No loc. Recent health at baseline, no recent febrile illness. States also had mechanical fall approx 3 weeks ago.  No headaches.       Past Medical History  Diagnosis Date  . HTN (hypertension)   . Depression   . Recurrent UTI     hx of hospitalization for pyelonephritis; started abx prophylaxis 06/2015  . Hay fever   . Mixed incontinence urge and stress   . Diverticular disease   . Insomnia   . Fibromyalgia     Patient states dx was around her late 51s but she had sx's for years prior to this.  . Syncope     Hypotensive; ED visit--Dr. Terrence Dupont did Cath--nonobstructive CAD, EF 55-60%.  In retrospect, suspect pt rx med misuse/polypharmacy  . Idiopathic angio-edema-urticaria 72014    Angioedema component was very minimal  . Asthma     w/ asbestososis   . History of pneumonia     hospitalized 12/2011, 02/2013, and 07/2013 [facility]) for this  . Anginal pain ([place])     Nonobstructive CAD 2014; however, her cardiologist put her on a statin for this and NOT for hyperlipidemia per pt report.  . OSA on CPAP     prior to move  to [place]--had another sleep study 10/2015 w/pulm Dr. Camillo Flaming.  . H/O hiatal hernia   . Migraine syndrome     "not as often anymore; used to be ~ q wk" (07/12/2013)  . Tension headache, chronic   . DDD (degenerative disc disease)     lumbar and cervical.   . Osteoarthritis     "severe; progressing fast" (07/12/2013); multiple joints-not surgical candidate for TKR (03/2015)  . Chronic lower back pain   . Anxiety     panic attacks  . Nephrolithiasis     "passed all on my own or they are still in there" (07/12/2013)  . Pyelonephritis     "several times over the last 30 yr" (07/12/2013)  . Diastolic congestive heart failure ([place])   . COPD (chronic obstructive pulmonary disease) ([place])   . Pulmonary embolism ([place]) 07/2013    Dx at [facility] with very small peripheral upper lobe pe 07/2013: pt took coumadin for about 8-9 mo  . Pleural plaque with presence of asbestos 07/22/2013  . BPPV (benign paroxysmal positional vertigo) 12/16/2012  . RBBB (right bundle branch block)   . Acute upper GI bleed 06/2014    while pt taking coumadin, plavix, and meloxicam---despite being told not to take coumadin.  . Iron deficiency anemia     Hematologist in [place], [place] did extensive w/u; no cause found;  failed oral supplement;; gets fairly regular (q93m or so) IV iron infusions (Venofer -iron sucrose- 200mg  with procrit.  "for 14 yr I've been getting blood work q month & getting infusions prn" (07/12/2013).  Dr. Marin Olp locally, iron infusions done, EPO deficiency dx'd  . Pernicious anemia 08/24/2014   Past Surgical History  Procedure Laterality Date  . Appendectomy  1960  . Total abdominal hysterectomy  1974  . Tendon release  1996    Right forearm and hand  . Knee surgery  2005  . Heel spur surgery Left 2008  . Plantar fascia release Left 2008  . Axillary surgery Left 1978    Multiple "lump" in armpit per pt  . Coccyx removal  1972  . Cardiac catheterization  01/2013    nonobstructive CAD, EF 55-60%  . Transthoracic  echocardiogram  01/2013; 04/2014;08/2015    2014--NORMAL.  2015--focal basal septal hypertrophy, EF 55-60%, grade I diast dysfxn, mild LAE.  08/2015 EF 55-60%, nl LV syst fxn, grade I DD, valves wnl  . Dilation and curettage of uterus  ? 1970's  . Eye surgery Left 2012-2013    "injections for ~ 1 yr; don't really know what for" (07/12/2013)  . Spirometry  04/25/14    In hosp for acute asthma/COPD flare: mixed obstructive and restrictive lung disease. The FEV1 is severely reduced at 45% predicted.  FEV1 signif decreased compared to prior spirometry 07/23/13.  [name] Kitchen Esophagogastroduodenoscopy N/A 07/19/2014    Gastritis found + in the setting of supratherapeutic INR, +plavix, + meloxicam.  . Left heart catheterization with coronary angiogram N/A 01/30/2013    Procedure: LEFT HEART CATHETERIZATION WITH CORONARY ANGIOGRAM;  Surgeon: Clent Demark, MD;  Location: [facility] CATH LAB;  Service: Cardiovascular;  Laterality: N/A;  . Cardiovascular stress test  02/22/15    Low risk myocard perf imaging; wall motion normal, normal EF   Family History  Problem Relation Age of Onset  . Arthritis Mother   . Kidney disease Mother   . Heart disease Father   . Stroke Father   . Hypertension Father   . Diabetes Father    Social History  Substance Use Topics  . Smoking status: Never Smoker   . Smokeless tobacco: Never Used     Comment: never used tobacco  . Alcohol Use: No   OB History    No data available     Review of Systems  Constitutional: Negative for fever and chills.  HENT: Negative for sore throat.   Eyes: Negative for redness.  Respiratory: Negative for shortness of breath.   Cardiovascular: Negative for chest pain.  Gastrointestinal: Negative for vomiting, abdominal pain and diarrhea.  Genitourinary: Negative for flank pain.  Musculoskeletal: Negative for back pain and neck pain.  Skin: Negative for rash.  Neurological: Negative for weakness, numbness and headaches.  Hematological: Does not  bruise/bleed easily.  Psychiatric/Behavioral: Negative for confusion.      Allergies  Penicillins  Home Medications   Prior to Admission medications   Medication Sig Start Date End Date Taking? Authorizing Provider  albuterol (PROVENTIL HFA;VENTOLIN HFA) 108 (90 BASE) MCG/ACT inhaler Inhale 2 puffs into the lungs every 6 (six) hours as needed for wheezing or shortness of breath. 09/15/14   Tammi Sou, MD  ALPRAZolam Duanne Moron) 1 MG tablet TAKE 1 TABLET 3 TIMES A DAY. 09/28/15   Tammi Sou, MD  ARIPiprazole (ABILIFY) 5 MG tablet Take 1 tablet (5 mg total) by mouth daily. 09/07/15   Tammi Sou, MD  aspirin 81 MG EC tablet Take 1 tablet (81 mg total) by mouth daily. 10/20/15   Brittainy Erie Noe, PA-C  atorvastatin (LIPITOR) 20 MG tablet Take 1 tablet (20 mg total) by mouth daily at 6 PM. 05/03/15   Tammi Sou, MD  budesonide-formoterol [facility]) 160-4.5 MCG/ACT inhaler Inhale 2 puffs into the lungs 2 (two) times daily. 10/06/14   Irene Pap, NP  BYSTOLIC 5 MG tablet TAKE 1 TABLET ONCE DAILY. 12/12/15   Tammi Sou, MD  Cholecalciferol (VITAMIN D3) 50000 UNITS CAPS Take 50,000 Units by mouth once a week. Patient taking differently: Take 50,000 Units by mouth once a week. Take on Mondays 12/02/14   Tammi Sou, MD  diclofenac sodium (VOLTAREN) 1 % GEL Apply 4 g topically 3 (three) times daily. TO BOTH KNEES Patient taking differently: Apply 4 g topically daily as needed (knee pain).  01/16/15   Meredith Staggers, MD  DULoxetine (CYMBALTA) 30 MG capsule TAKE 1 CAPSULE ONCE DAILY ALONG WITH 60MG  CAPSULE FOR TOTAL 90MG  DAILY. 10/23/15   Tammi Sou, MD  DULoxetine (CYMBALTA) 60 MG capsule 1 cap po qd to be combined with a 30 mg duloxetine cap daily Patient taking differently: Take 60 mg by mouth at bedtime. to be combined with a 30 mg duloxetine cap daily 05/03/15   Tammi Sou, MD  folic acid (FOLVITE) 1 MG tablet Take 2 tablets (2 mg total) by mouth  daily. 09/15/14   Tammi Sou, MD  furosemide (LASIX) 40 MG tablet Take 1 tablet (40 mg total) by mouth daily. 10/20/15   Brittainy Erie Noe, PA-C  isosorbide mononitrate (IMDUR) 30 MG 24 hr tablet TAKE 1 TABLET ONCE DAILY. 12/12/15   Tammi Sou, MD  Misc Natural Products (OSTEO BI-FLEX ADV JOINT SHIELD) TABS Take 1 tablet by mouth daily.    Historical Provider, MD  Multiple Vitamin (MULTIVITAMIN WITH MINERALS) TABS tablet Take 1 tablet by mouth daily.    Historical Provider, MD  nitroGLYCERIN (NITROSTAT) 0.4 MG SL tablet Place 0.4 mg under the tongue every 5 (five) minutes as needed for chest pain.    Historical Provider, MD  ondansetron (ZOFRAN) 4 MG tablet Take 1 tablet (4 mg total) by mouth every 6 (six) hours. Patient taking differently: Take 4 mg by mouth every 6 (six) hours as needed for nausea or vomiting.  09/15/14   Hyman Bible, PA-C  oxyCODONE-acetaminophen (PERCOCET) 10-325 MG tablet Take 1 tablet by mouth every 6 (six) hours as needed for pain. ONE MONTH SUPPLY 12/12/15   Bayard Hugger, NP  OXYGEN Inhale 3 L into the lungs continuous.    Historical Provider, MD  pantoprazole (PROTONIX) 40 MG tablet Take 1 tablet (40 mg total) by mouth daily. 05/03/15   Tammi Sou, MD  polyethylene glycol powder (GLYCOLAX/MIRALAX) powder Take 17 g by mouth daily. Patient taking differently: Take 17 g by mouth daily as needed for mild constipation or moderate constipation.  09/15/14   Tammi Sou, MD  potassium chloride SA (K-DUR,KLOR-CON) 20 MEQ tablet Take 2 tablets (40 mEq total) by mouth daily. 10/20/15   Brittainy Erie Noe, PA-C  pregabalin (LYRICA) 300 MG capsule Take 1 capsule twice a day 10/30/15   Volanda Napoleon, MD  traZODone (DESYREL) 50 MG tablet Take 3 tablets (150 mg total) by mouth at bedtime. 06/29/15   Tammi Sou, MD   BP 179/70 mmHg  Pulse 74  Temp(Src) 97.8 F (36.6 C) (Oral)  Resp 16  SpO2 95% Physical Exam  Constitutional: She is oriented to  person, place, and time. She appears well-developed and well-nourished. No distress.  HENT:  Head: Atraumatic.  Eyes: Conjunctivae are normal. Pupils are equal, round, and reactive to light. No scleral icterus.  Neck: Normal range of motion. Neck supple. No tracheal deviation present.  Cardiovascular: Normal rate, regular rhythm and normal heart sounds.   Pulmonary/Chest: Effort normal and breath sounds normal. No respiratory distress.  Abdominal: Soft. Normal appearance and bowel sounds are normal. She exhibits no distension. There is no tenderness.  Genitourinary:  No cva tenderness  Musculoskeletal: She exhibits no edema.  CTLS spine, non tender, aligned, no step off. Mild lumbar and thoracic muscular tenderness. Good rom bil ext without focal bony tenderness.   Neurological: She is alert and oriented to person, place, and time.  Motor intact bil, stre 4+/5. sens grossly intact.  Unable to ambulate, leans to side.   Skin: Skin is warm and dry. No rash noted. She is not diaphoretic.  Psychiatric: She has a normal mood and affect.  Nursing note and vitals reviewed.   ED Course  Procedures (including critical care time) Labs Review   Results for orders placed or performed during the hospital encounter of 01/01/16  CBC  Result Value Ref Range   WBC 7.4 4.0 - 10.5 K/uL   RBC 4.72 3.87 - 5.11 MIL/uL   Hemoglobin 14.2 12.0 - 15.0 g/dL   HCT 41.3 36.0 - 46.0 %   MCV 87.5 78.0 - 100.0 fL   MCH 30.1 26.0 - 34.0 pg   MCHC 34.4 30.0 - 36.0 g/dL   RDW 12.6 11.5 - 15.5 %   Platelets 183 150 - 400 K/uL  Comprehensive metabolic panel  Result Value Ref Range   Sodium 144 135 - 145 mmol/L   Potassium 2.8 (L) 3.5 - 5.1 mmol/L   Chloride 101 101 - 111 mmol/L   CO2 31 22 - 32 mmol/L   Glucose, Bld 118 (H) 65 - 99 mg/dL   BUN 7 6 - 20 mg/dL   Creatinine, Ser 0.62 0.44 - 1.00 mg/dL   Calcium 9.4 8.9 - 10.3 mg/dL   Total Protein 7.8 6.5 - 8.1 g/dL   Albumin 4.2 3.5 - 5.0 g/dL   AST 23 15 -  41 U/L   ALT 15 14 - 54 U/L   Alkaline Phosphatase 100 38 - 126 U/L   Total Bilirubin 0.9 0.3 - 1.2 mg/dL   GFR calc non Af Amer >60 >60 mL/min   GFR calc Af Amer >60 >60 mL/min   Anion gap 12 5 - 15  Urinalysis, Routine w reflex microscopic (not at [facility])  Result Value Ref Range   Color, Urine YELLOW YELLOW   APPearance CLOUDY (A) CLEAR   Specific Gravity, Urine 1.008 1.005 - 1.030   pH 6.5 5.0 - 8.0   Glucose, UA NEGATIVE NEGATIVE mg/dL   Hgb urine dipstick TRACE (A) NEGATIVE   Bilirubin Urine NEGATIVE NEGATIVE   Ketones, ur NEGATIVE NEGATIVE mg/dL   Protein, ur NEGATIVE NEGATIVE mg/dL   Nitrite NEGATIVE NEGATIVE   Leukocytes, UA SMALL (A) NEGATIVE  Urine microscopic-add on  Result Value Ref Range   Squamous Epithelial / LPF 0-5 (A) NONE SEEN   WBC, UA 0-5 0 - 5 WBC/hpf   RBC / HPF 0-5 0 - 5 RBC/hpf   Bacteria, UA MANY (A) NONE SEEN       I have personally reviewed and evaluated these images and  lab results as part of my medical decision-making.   EKG Interpretation   Date/Time:  Monday January 01 2016 13:17:02 EST Ventricular Rate:  73 PR Interval:  155 QRS Duration: 167 QT Interval:  464 QTC Calculation: 511 R Axis:   -10 Text Interpretation:  Sinus rhythm Right bundle branch block No  significant change since last tracing Confirmed by Ashok Cordia  MD, Lennette Bihari  ([id number]) on 01/01/2016 1:24:11 PM      MDM   Iv ns. Labs.  Reviewed nursing notes and prior charts for additional history.   k low, kcl iv and po.  Given falls, and pt indicated lives independently, will get CM consult.  Pt requests pain meds, is on pain meds at home.  Hydrocodone.   Pt on lasix, also on k replacement, will increased k supp for next few days.  Possible uti on labs, cx sent, will rx.  Pt afeb. Tolerating po.  CM evaluated - pt prefers/requests d/c to current living situation, has home health referrals, home health sw.  Family present, story changes, indicates pt fell last pm around  8 pm, and couldn't get self up.  They report fall 3 weeks ago w rib fx.  Pt lives alone, independently.    CK added to labs. Iv ns.   Pt unable to stand/ambulate even w assist.   Will consult hospitalists for admission.  Discussed with admitting hospitalist that CK, CT, and cxr remain pending - they will admit.        Lajean Saver, MD 01/01/16 [phone]

## 2016-01-01 NOTE — ED Notes (Signed)
ramaswami md called back and said DG esophagus could be performed tomorrow.

## 2016-01-01 NOTE — ED Notes (Signed)
Bed: [id number] Expected date:  Expected time:  Means of arrival:  Comments: 70 y/o F, fall yesterday

## 2016-01-01 NOTE — Progress Notes (Signed)
2:35p- CSW staffed with Nurse CM. CSW spoke with patient at bedside. Patient's daughter-in-law was present during this encounter. Patient stated she receives OT and PT at the [facility], however, she does not receive PT at this time due to fractured ribs. Patient stated she called the therapist to see if she was being seen. Patient stated the therapist came in and they then called the police. Patient stated she not far enough back on her walker seat. Patient stated she has problems with her knees because she does not have any cartilage. Patient stated she had been going to the doctor for one year to obtain injections. Patient stated she cannot have knee surgery due to issues with her heart. Patient stated she has an appointment tomorrow with [facility] regarding her knees.   Patient stated she had previous services with [facility] that would assist patients as needed, however, she ceased the services.   Patient stated she has been at [facility] for one year and four months.Patient stated she can bath and dress herself. Patient stated she also continues to drive. Patient stated her husband passed away and she did not want to live alone. Patient stated she utilizes a walker.    Patient's daughter-in-law stated patient has an apartment and she receives three meals daily. Daughter-in-law stated they clean patient's apartment. Daughter-in-law also stated that patient's son (daughter-in-laws husband) checks on patient all of the time.   Moksha Fraire, daughter-in-law, [phone]  Genice Rouge [id number] ED CSW 01/01/2016 3:14 PM     Patient had no questions for CSW at this time.

## 2016-01-01 NOTE — Discharge Instructions (Signed)
It was our pleasure to provide your ER care today - we hope that you feel better.  Rest. Drink adequate fluids.  Take keflex (antibiotic) as prescribed, for possible urine infection.  From today's lab tests, your potassium level is low (2.8) - take an extra of your potassium supplement a day for the next week, eat plenty of fruits and vegetables, and have level rechecked by your doctor in 1 week.   Follow up with your doctor to maximize your home health services, and follow up with them should in future you desire a higher level of care.  Return to ER if worse, new symptoms, fevers, persistent vomiting, new/worsening or intractable pain, other concern.       Fall Prevention in the Home  Falls can cause injuries and can affect people from all age groups. There are many simple things that you can do to make your home safe and to help prevent falls. WHAT CAN I DO ON THE OUTSIDE OF MY HOME?  Regularly repair the edges of walkways and driveways and fix any cracks.  Remove high doorway thresholds.  Trim any shrubbery on the main path into your home.  Use bright outdoor lighting.  Clear walkways of debris and clutter, including tools and rocks.  Regularly check that handrails are securely fastened and in good repair. Both sides of any steps should have handrails.  Install guardrails along the edges of any raised decks or porches.  Have leaves, snow, and ice cleared regularly.  Use sand or salt on walkways during winter months.  In the garage, clean up any spills right away, including grease or oil spills. WHAT CAN I DO IN THE BATHROOM?  Use night lights.  Install grab bars by the toilet and in the tub and shower. Do not use towel bars as grab bars.  Use non-skid mats or decals on the floor of the tub or shower.  If you need to sit down while you are in the shower, use a plastic, non-slip stool.[name] Kitchen  Keep the floor dry. Immediately clean up any water that spills on the  floor.  Remove soap buildup in the tub or shower on a regular basis.  Attach bath mats securely with double-sided non-slip rug tape.  Remove throw rugs and other tripping hazards from the floor. WHAT CAN I DO IN THE BEDROOM?  Use night lights.  Make sure that a bedside light is easy to reach.  Do not use oversized bedding that drapes onto the floor.  Have a firm chair that has side arms to use for getting dressed.  Remove throw rugs and other tripping hazards from the floor. WHAT CAN I DO IN THE KITCHEN?   Clean up any spills right away.  Avoid walking on wet floors.  Place frequently used items in easy-to-reach places.  If you need to reach for something above you, use a sturdy step stool that has a grab bar.  Keep electrical cables out of the way.  Do not use floor polish or wax that makes floors slippery. If you have to use wax, make sure that it is non-skid floor wax.  Remove throw rugs and other tripping hazards from the floor. WHAT CAN I DO IN THE STAIRWAYS?  Do not leave any items on the stairs.  Make sure that there are handrails on both sides of the stairs. Fix handrails that are broken or loose. Make sure that handrails are as long as the stairways.  Check any carpeting to make sure  that it is firmly attached to the stairs. Fix any carpet that is loose or worn.  Avoid having throw rugs at the top or bottom of stairways, or secure the rugs with carpet tape to prevent them from moving.  Make sure that you have a light switch at the top of the stairs and the bottom of the stairs. If you do not have them, have them installed. WHAT ARE SOME OTHER FALL PREVENTION TIPS?  Wear closed-toe shoes that fit well and support your feet. Wear shoes that have rubber soles or low heels.  When you use a stepladder, make sure that it is completely opened and that the sides are firmly locked. Have someone hold the ladder while you are using it. Do not climb a closed  stepladder.  Add color or contrast paint or tape to grab bars and handrails in your home. Place contrasting color strips on the first and last steps.  Use mobility aids as needed, such as canes, walkers, scooters, and crutches.  Turn on lights if it is dark. Replace any light bulbs that burn out.  Set up furniture so that there are clear paths. Keep the furniture in the same spot.  Fix any uneven floor surfaces.  Choose a carpet design that does not hide the edge of steps of a stairway.  Be aware of any and all pets.  Review your medicines with your healthcare provider. Some medicines can cause dizziness or changes in blood pressure, which increase your risk of falling. Talk with your health care provider about other ways that you can decrease your risk of falls. This may include working with a physical therapist or trainer to improve your strength, balance, and endurance.   This information is not intended to replace advice given to you by your health care provider. Make sure you discuss any questions you have with your health care provider.   Document Released: 11/29/2002 Document Revised: 04/25/2015 Document Reviewed: 01/13/2015 Elsevier Interactive Patient Education 2016 [employer].    Hypokalemia Hypokalemia means that the amount of potassium in the blood is lower than normal.Potassium is a chemical, called an electrolyte, that helps regulate the amount of fluid in the body. It also stimulates muscle contraction and helps nerves function properly.Most of the body's potassium is inside of cells, and only a very small amount is in the blood. Because the amount in the blood is so small, minor changes can be life-threatening. CAUSES  Antibiotics.  Diarrhea or vomiting.  Using laxatives too much, which can cause diarrhea.  Chronic kidney disease.  Water pills (diuretics).  Eating disorders (bulimia).  Low magnesium level.  Sweating a lot. SIGNS AND  SYMPTOMS  Weakness.  Constipation.  Fatigue.  Muscle cramps.  Mental confusion.  Skipped heartbeats or irregular heartbeat (palpitations).  Tingling or numbness. DIAGNOSIS  Your health care provider can diagnose hypokalemia with blood tests. In addition to checking your potassium level, your health care provider may also check other lab tests. TREATMENT Hypokalemia can be treated with potassium supplements taken by mouth or adjustments in your current medicines. If your potassium level is very low, you may need to get potassium through a vein (IV) and be monitored in the hospital. A diet high in potassium is also helpful. Foods high in potassium are:  Nuts, such as peanuts and pistachios.  Seeds, such as sunflower seeds and pumpkin seeds.  Peas, lentils, and lima beans.  Whole grain and bran cereals and breads.  Fresh fruit and vegetables, such as  apricots, avocado, bananas, cantaloupe, kiwi, oranges, tomatoes, asparagus, and potatoes.  Orange and tomato juices.  Red meats.  Fruit yogurt. HOME CARE INSTRUCTIONS  Take all medicines as prescribed by your health care provider.  Maintain a healthy diet by including nutritious food, such as fruits, vegetables, nuts, whole grains, and lean meats.  If you are taking a laxative, be sure to follow the directions on the label. SEEK MEDICAL CARE IF:  Your weakness gets worse.  You feel your heart pounding or racing.  You are vomiting or having diarrhea.  You are diabetic and having trouble keeping your blood glucose in the normal range. SEEK IMMEDIATE MEDICAL CARE IF:  You have chest pain, shortness of breath, or dizziness.  You are vomiting or having diarrhea for more than 2 days.  You faint. MAKE SURE YOU:   Understand these instructions.  Will watch your condition.  Will get help right away if you are not doing well or get worse.   This information is not intended to replace advice given to you by your health  care provider. Make sure you discuss any questions you have with your health care provider.   Document Released: 12/09/2005 Document Revised: 12/30/2014 Document Reviewed: 06/11/2013 Elsevier Interactive Patient Education [employer].

## 2016-01-01 NOTE — Progress Notes (Addendum)
Cm called [phone] [facility] who transferred CM to [place] who they use to assist with services at the independent level Entered in [facility] Call on 01/01/2016 As needed [place] [facility] has agreed to assist with CNA and nurse services and medication management [place], [place] [address] [phone]

## 2016-01-01 NOTE — Progress Notes (Addendum)
This Pt confirms she is at an independent level of care at [facility] -has her own apartment Pt tells CM she does NOT want to go to a higher level of care States that she h ad cna servcies until a "few weeks ago"  Cm noted that the [facility] ED Cm had noted pt to have increase services offered with assist of pt's daughter in October 2016 Pt states she has used services of companies like [facility] but presently not wanting services from [facility] because "I had a bad experience with one of the CNAs"  Agreed to allow Cm to contact [facility] to see if extra services could be offered CM assisted pt by handing her her purse so she could get her chap stick and iphone out of it Returned purse to chair at [employer] with ED RN after leaving pt room who states pt did not want to come to ED because she believes it will change her level of care  CM shared with her the concern of EDP, [place] that she needs a higher level of care. SW consult entered in [facility] for possible assist with a high level of care   CM reviewed in details medicare guidelines, home health [facility]) (length of stay in home, types of [facility] staff available, coverage, primary caregiver, up to 24 hrs before services may be started) and Private duty nursing (PDN-coverage, length of stay in the home types of staff available).  CM reviewed availability of [place] [facility] to assist pcp to get pt to snf (if desired disposition) from the community level. CM provided pt with a list of [place] home health agencies and PDN. Placed in her pt belonging bag on bedside chair

## 2016-01-01 NOTE — ED Notes (Signed)
Pt to CT

## 2016-01-01 NOTE — ED Notes (Signed)
Pt states she will attempt to urinate in 10 minutes

## 2016-01-01 NOTE — ED Notes (Addendum)
Pt unable to sit up on her own. Family reports normally pt able to walk on her own,and 2 days ago was driving. Pt unable to stand by herself, wobbly on feet even with staff assistance. Pt will be admitted. [facility] called and canceled.

## 2016-01-01 NOTE — H&P (Signed)
Patient Demographics  Linda [name], is a 70 y.o. female  MRN: [id number]   DOB - Aug 15, 1946  Admit Date - 01/01/2016  Outpatient Primary MD for the patient is Tammi Sou, MD   Assessment [name] is a pleasant 70 year old female who lives alone and has multiple medical problems, including chronic pain syndrome/OSA/Chronicrespiratory failure/Essential Htn/lung fibrosis/chronic diastolic CHF/?polypharmacy who comes in after she fell at home and she lay on the floor for 13 hours or so as she was too weak to get herself to the phone to call for help, and she seems to have Acute metabolic encephalopathy resulting from UTI/Pneumonia which caused her to fall in the setting of psychoactive medications. There is possibility of Influenza. Patient also has mild rhabdomyolysis from lying on the floor for 12 hours or so. Her white count is is 7.400, potassium 2.8, CPK 880. CT chest shows "1. Bilateral calcified pleural plaques. Many of the plaques in the lower lobes dependently with mild increased adjacent pleural thickening and coalescence from prior exam. 2. Minimal debris in the left mainstem bronchus, likely mild mucous plugging". Patient will be admitted for further management including gentle hydration, electrolyte replacement, and empiric antibiotics. Patient will need psychoactive medications streamlined. Will also check vitamin b12/tsh/rpr/folate. Will request PT/Case management to for discharge logistics. I discussed the care plan with patient's son and daughter in law at bedside. She is full code. Plan Encephalopathy, metabolic/UTI (urinary tract infection)/CAP (community acquired pneumonia)/Fall at home/Hypokalemia/Rhabdomyolysis  Admit Med Surg  Follow septic work up including Urine culture/influenza swab  Vitamin b12 level/rpr/folate  Levaquin/Tamiflu/Kcl/IVF   Physical therapy evaluation Polypharmacy/Generalized anxiety disorder--with occasional panic attacks/Recurrent  major depression-severe (HCC)/Chronic pain syndrome  Resume home meds and defer long term management to patient's primary care providers. Psychoactive Medications maybe contributing to recurrent falls. OSA (obstructive sleep apnea)/Fibrositis/Chronic respiratory failure (HCC)  O2 as needed/bronchodilators as needed Essential HTN (hypertension), benign  Resume home meds DVT/GI Prophylaxis  Lovenox  PPI Family Communication: Son and sister in law at bedside  Code Status   Full Code  Likely DC  To be determined  Condition GUARDED    Time spent in minutes : 17  Chief Complaint Chief Complaint  Patient presents with  . Fall     HPI Linda [name]  is a 70 y.o. female who fell at home and could not get up to get help until this morning when she called physical therapy. She has been falling in recent months. She broke her ribs in December. Patient admits to dry cough. Admits to dysuria. Had chills but no fever. She fell on walker in the night but could get to phone till this morning. No diarrhea.     Review of Systems   As in the HPI above.   Past Medical History  Diagnosis Date  . HTN (hypertension)   . Depression   . Recurrent UTI     hx of hospitalization for pyelonephritis; started abx prophylaxis 06/2015  . Hay fever   . Mixed incontinence urge and stress   . Diverticular disease   . Insomnia   . Fibromyalgia     Patient states dx was around her late 58s but she had sx's for years prior to this.  . Syncope     Hypotensive; ED visit--Dr. Terrence Dupont did Cath--nonobstructive CAD, EF 55-60%.  In retrospect, suspect pt rx med misuse/polypharmacy  . Idiopathic angio-edema-urticaria 72014    Angioedema component was very minimal  . Asthma  w/ asbestososis   . History of pneumonia     hospitalized 12/2011, 02/2013, and 07/2013 [facility]) for this  . Anginal pain ([place])     Nonobstructive CAD 2014; however, her cardiologist put her on a statin for this and NOT for  hyperlipidemia per pt report.  . OSA on CPAP     prior to move to [place]--had another sleep study 10/2015 w/pulm Dr. Camillo Flaming.  . H/O hiatal hernia   . Migraine syndrome     "not as often anymore; used to be ~ q wk" (07/12/2013)  . Tension headache, chronic   . DDD (degenerative disc disease)     lumbar and cervical.   . Osteoarthritis     "severe; progressing fast" (07/12/2013); multiple joints-not surgical candidate for TKR (03/2015)  . Chronic lower back pain   . Anxiety     panic attacks  . Nephrolithiasis     "passed all on my own or they are still in there" (07/12/2013)  . Pyelonephritis     "several times over the last 30 yr" (07/12/2013)  . Diastolic congestive heart failure ([place])   . COPD (chronic obstructive pulmonary disease) ([place])   . Pulmonary embolism ([place]) 07/2013    Dx at [facility] with very small peripheral upper lobe pe 07/2013: pt took coumadin for about 8-9 mo  . Pleural plaque with presence of asbestos 07/22/2013  . BPPV (benign paroxysmal positional vertigo) 12/16/2012  . RBBB (right bundle branch block)   . Acute upper GI bleed 06/2014    while pt taking coumadin, plavix, and meloxicam---despite being told not to take coumadin.  . Iron deficiency anemia     Hematologist in [place], [place] did extensive w/u; no cause found; failed oral supplement;; gets fairly regular (q62m or so) IV iron infusions (Venofer -iron sucrose- 200mg  with procrit.  "for 14 yr I've been getting blood work q month & getting infusions prn" (07/12/2013).  Dr. Marin Olp locally, iron infusions done, EPO deficiency dx'd  . Pernicious anemia 08/24/2014      Past Surgical History  Procedure Laterality Date  . Appendectomy  1960  . Total abdominal hysterectomy  1974  . Tendon release  1996    Right forearm and hand  . Knee surgery  2005  . Heel spur surgery Left 2008  . Plantar fascia release Left 2008  . Axillary surgery Left 1978    Multiple "lump" in armpit per pt  . Coccyx removal  1972  . Cardiac  catheterization  01/2013    nonobstructive CAD, EF 55-60%  . Transthoracic echocardiogram  01/2013; 04/2014;08/2015    2014--NORMAL.  2015--focal basal septal hypertrophy, EF 55-60%, grade I diast dysfxn, mild LAE.  08/2015 EF 55-60%, nl LV syst fxn, grade I DD, valves wnl  . Dilation and curettage of uterus  ? 1970's  . Eye surgery Left 2012-2013    "injections for ~ 1 yr; don't really know what for" (07/12/2013)  . Spirometry  04/25/14    In hosp for acute asthma/COPD flare: mixed obstructive and restrictive lung disease. The FEV1 is severely reduced at 45% predicted.  FEV1 signif decreased compared to prior spirometry 07/23/13.  [name] Kitchen Esophagogastroduodenoscopy N/A 07/19/2014    Gastritis found + in the setting of supratherapeutic INR, +plavix, + meloxicam.  . Left heart catheterization with coronary angiogram N/A 01/30/2013    Procedure: LEFT HEART CATHETERIZATION WITH CORONARY ANGIOGRAM;  Surgeon: Clent Demark, MD;  Location: [facility] CATH LAB;  Service: Cardiovascular;  Laterality: N/A;  . Cardiovascular  stress test  02/22/15    Low risk myocard perf imaging; wall motion normal, normal EF    Social History Social History  Substance Use Topics  . Smoking status: Never Smoker   . Smokeless tobacco: Never Used     Comment: never used tobacco  . Alcohol Use: No    Family History Family History  Problem Relation Age of Onset  . Arthritis Mother   . Kidney disease Mother   . Heart disease Father   . Stroke Father   . Hypertension Father   . Diabetes Father     Prior to Admission medications   Medication Sig Start Date End Date Taking? Authorizing Provider  albuterol (PROVENTIL HFA;VENTOLIN HFA) 108 (90 BASE) MCG/ACT inhaler Inhale 2 puffs into the lungs every 6 (six) hours as needed for wheezing or shortness of breath. 09/15/14  Yes Tammi Sou, MD  ALPRAZolam Duanne Moron) 1 MG tablet TAKE 1 TABLET 3 TIMES A DAY. 09/28/15  Yes Tammi Sou, MD  ARIPiprazole (ABILIFY) 5 MG tablet Take 1 tablet (5  mg total) by mouth daily. 09/07/15  Yes Tammi Sou, MD  aspirin 81 MG EC tablet Take 1 tablet (81 mg total) by mouth daily. 10/20/15  Yes Brittainy Erie Noe, PA-C  atorvastatin (LIPITOR) 20 MG tablet Take 1 tablet (20 mg total) by mouth daily at 6 PM. 05/03/15  Yes Tammi Sou, MD  budesonide-formoterol [facility]) 160-4.5 MCG/ACT inhaler Inhale 2 puffs into the lungs 2 (two) times daily. 10/06/14  Yes Layne Read Drivers, NP  BYSTOLIC 5 MG tablet TAKE 1 TABLET ONCE DAILY. 12/12/15  Yes Tammi Sou, MD  Cholecalciferol (VITAMIN D3) 50000 UNITS CAPS Take 50,000 Units by mouth once a week. Patient taking differently: Take 50,000 Units by mouth once a week. Take on Mondays 12/02/14  Yes Tammi Sou, MD  diclofenac sodium (VOLTAREN) 1 % GEL Apply 4 g topically 3 (three) times daily. TO BOTH KNEES Patient taking differently: Apply 4 g topically daily as needed (knee pain).  01/16/15  Yes Meredith Staggers, MD  diphenhydrAMINE (BENADRYL) 25 MG tablet Take 25-50 mg by mouth every 6 (six) hours as needed for itching or allergies.   Yes Historical Provider, MD  DULoxetine (CYMBALTA) 30 MG capsule TAKE 1 CAPSULE ONCE DAILY ALONG WITH 60MG  CAPSULE FOR TOTAL 90MG  DAILY. 10/23/15  Yes Tammi Sou, MD  DULoxetine (CYMBALTA) 60 MG capsule 1 cap po qd to be combined with a 30 mg duloxetine cap daily Patient taking differently: Take 60 mg by mouth at bedtime. to be combined with a 30 mg duloxetine cap daily 05/03/15  Yes Tammi Sou, MD  folic acid (FOLVITE) 1 MG tablet Take 2 tablets (2 mg total) by mouth daily. 09/15/14  Yes Tammi Sou, MD  furosemide (LASIX) 40 MG tablet Take 1 tablet (40 mg total) by mouth daily. 10/20/15  Yes Brittainy Erie Noe, PA-C  isosorbide mononitrate (IMDUR) 30 MG 24 hr tablet TAKE 1 TABLET ONCE DAILY. 12/12/15  Yes Tammi Sou, MD  Misc Natural Products (OSTEO BI-FLEX ADV JOINT SHIELD) TABS Take 1 tablet by mouth daily.   Yes Historical Provider, MD    Multiple Vitamin (MULTIVITAMIN WITH MINERALS) TABS tablet Take 1 tablet by mouth daily.   Yes Historical Provider, MD  nitroGLYCERIN (NITROSTAT) 0.4 MG SL tablet Place 0.4 mg under the tongue every 5 (five) minutes as needed for chest pain.   Yes Historical Provider, MD  ondansetron (ZOFRAN) 4 MG tablet  Take 1 tablet (4 mg total) by mouth every 6 (six) hours. Patient taking differently: Take 4 mg by mouth every 6 (six) hours as needed for nausea or vomiting.  09/15/14  Yes Hyman Bible, PA-C  oxyCODONE-acetaminophen (PERCOCET) 10-325 MG tablet Take 1 tablet by mouth every 6 (six) hours as needed for pain. ONE MONTH SUPPLY 12/12/15  Yes Bayard Hugger, NP  OXYGEN Inhale 3 L into the lungs continuous.   Yes Historical Provider, MD  pantoprazole (PROTONIX) 40 MG tablet Take 1 tablet (40 mg total) by mouth daily. 05/03/15  Yes Tammi Sou, MD  Polyethyl Glycol-Propyl Glycol (SYSTANE) 0.4-0.3 % SOLN Place 1 drop into both eyes daily.   Yes Historical Provider, MD  polyethylene glycol powder (GLYCOLAX/MIRALAX) powder Take 17 g by mouth daily. Patient taking differently: Take 17 g by mouth daily as needed for mild constipation or moderate constipation.  09/15/14  Yes Tammi Sou, MD  potassium chloride SA (K-DUR,KLOR-CON) 20 MEQ tablet Take 2 tablets (40 mEq total) by mouth daily. 10/20/15  Yes Brittainy Erie Noe, PA-C  pregabalin (LYRICA) 300 MG capsule Take 1 capsule twice a day 10/30/15  Yes Volanda Napoleon, MD  traZODone (DESYREL) 50 MG tablet Take 3 tablets (150 mg total) by mouth at bedtime. 06/29/15  Yes Tammi Sou, MD  cephALEXin (KEFLEX) 500 MG capsule Take 1 capsule (500 mg total) by mouth 3 (three) times daily. 01/01/16   Lajean Saver, MD  potassium chloride SA (K-DUR,KLOR-CON) 20 MEQ tablet Take 1 tablet (20 mEq total) by mouth daily. 01/01/16   Lajean Saver, MD    Allergies  Allergen Reactions  . Penicillins Itching, Swelling and Rash    Tolerated Cefepime in ED. Has patient  had a PCN reaction causing immediate rash, facial/tongue/throat swelling, SOB or lightheadedness with hypotension: Yes Has patient had a PCN reaction causing severe rash involving mucus membranes or skin necrosis: No Has patient had a PCN reaction that required hospitalization: No  Has patient had a PCN reaction occurring within the last 10 years: No     Physical Exam  Vitals  Blood pressure 131/84, pulse 79, temperature 98.2 F (36.8 C), temperature source Oral, resp. rate 16, SpO2 100 %.   1. General: lying in bed in NAD.  2. Normal affect and insight, Not Suicidal or Homicidal, Awake Alert, Oriented X 3.  3. No F.N deficits, ALL C.Nerves Intact, Strength 5/5 all 4 extremities, Sensation intact all 4 extremities, Plantars down going.  4. Ears and Eyes appear Normal, Conjunctivae clear, PERRLA. Moist Oral Mucosa.  5. Supple Neck, No JVD, No cervical lymphadenopathy appriciated, No Carotid Bruits.  6. Symmetrical Chest wall movement, Good air movement bilaterally, CTAB.  7. RRR, No Gallops, Rubs or Murmurs, No Parasternal Heave.  8. Positive Bowel Sounds, Abdomen Soft, Non tender, No organomegaly appriciated,No rebound -guarding or rigidity.  9.  No Cyanosis, Normal Skin Turgor, No Skin Rash or Bruise.  10. Good muscle tone,  joints appear normal , no effusions, Normal ROM.  11. No Palpable Lymph Nodes in Neck or Axillae  Data Review CBC  Recent Labs Lab 01/01/16 1127  WBC 7.4  HGB 14.2  HCT 41.3  PLT 183  MCV 87.5  MCH 30.1  MCHC 34.4  RDW 12.6   ------------------------------------------------------------------------------------------------------------------  Chemistries   Recent Labs Lab 01/01/16 1127  NA 144  K 2.8*  CL 101  CO2 31  GLUCOSE 118*  BUN 7  CREATININE 0.62  CALCIUM 9.4  AST 23  ALT 15  ALKPHOS 100  BILITOT 0.9    ------------------------------------------------------------------------------------------------------------------ CrCl cannot be calculated (Unknown ideal weight.). ------------------------------------------------------------------------------------------------------------------ No results for input(s): TSH, T4TOTAL, T3FREE, THYROIDAB in the last 72 hours.  Invalid input(s): FREET3   Coagulation profile No results for input(s): INR, PROTIME in the last 168 hours. ------------------------------------------------------------------------------------------------------------------- No results for input(s): DDIMER in the last 72 hours. -------------------------------------------------------------------------------------------------------------------  Cardiac Enzymes No results for input(s): CKMB, TROPONINI, MYOGLOBIN in the last 168 hours.  Invalid input(s): CK ------------------------------------------------------------------------------------------------------------------ Invalid input(s): POCBNP   ---------------------------------------------------------------------------------------------------------------  Urinalysis    Component Value Date/Time   COLORURINE YELLOW 01/01/2016 1217   APPEARANCEUR CLOUDY* 01/01/2016 1217   LABSPEC 1.008 01/01/2016 1217   PHURINE 6.5 01/01/2016 1217   GLUCOSEU NEGATIVE 01/01/2016 1217   HGBUR TRACE* 01/01/2016 1217   BILIRUBINUR NEGATIVE 01/01/2016 1217   BILIRUBINUR negative 07/07/2015 1439   KETONESUR NEGATIVE 01/01/2016 1217   PROTEINUR NEGATIVE 01/01/2016 1217   PROTEINUR negative 07/07/2015 1439   UROBILINOGEN 1.0 10/16/2015 2009   UROBILINOGEN 0.2 07/07/2015 1439   NITRITE NEGATIVE 01/01/2016 1217   NITRITE negative 07/07/2015 1439   LEUKOCYTESUR SMALL* 01/01/2016 1217    ----------------------------------------------------------------------------------------------------------------  Imaging results  Dg Chest 1 View  01/01/2016   CLINICAL DATA:  Fall last evening, on the ground for 12+ hours without oxygen. EXAM: CHEST 1 VIEW COMPARISON:  Radiographs and CT 10/17/2015 FINDINGS: Bilateral calcified pleural parenchymal plaques, unchanged from prior exam. Borderline cardiomegaly with atherosclerosis of the thoracic aorta. Mild atelectasis at the left lung base. No confluent airspace disease, large pleural effusion or pneumothorax. No pulmonary edema. No acute osseous abnormalities are seen. IMPRESSION: 1. No active disease. 2. Stable appearance of bilateral calcified pleural plaques. Electronically Signed   By: Jeb Levering M.D.   On: 01/01/2016 18:16   Ct Head Wo Contrast  01/01/2016  CLINICAL DATA:  Pain following fall EXAM: CT HEAD WITHOUT CONTRAST TECHNIQUE: Contiguous axial images were obtained from the base of the skull through the vertex without intravenous contrast. COMPARISON:  September 13, 2015 FINDINGS: There is age related volume loss. There is no intracranial mass, hemorrhage, extra-axial fluid collection, or midline shift. A small focus of decreased attenuation in the medial right temporal lobe is stable in may represent a small prior infarct or possibly an asymmetric deep sulcus. Gray-white compartments otherwise appear normal. No acute infarct evident. The bony calvarium appears intact. The visualized mastoid air cells are clear. No intraorbital lesions are appreciable. IMPRESSION: Age related volume loss. Question prior small right medial temporal lobe infarct versus deep sulcus in this area. This finding is stable. No intracranial mass, hemorrhage, or new focal gray -white compartment lesion to suggest acute infarct. Electronically Signed   By: Lowella Grip III M.D.   On: 01/01/2016 17:53   Ct Chest Wo Contrast  01/01/2016  CLINICAL DATA:  Cough. EXAM: CT CHEST WITHOUT CONTRAST TECHNIQUE: Multidetector CT imaging of the chest was performed following the standard protocol without IV contrast. COMPARISON:   Radiograph earlier this day.  Chest CT 10/17/2015 FINDINGS: Mediastinum/Lymph Nodes: Normal caliber thoracic aorta with mild atherosclerosis of the arch. Coronary stent versus coronary artery calcifications. The heart is normal in size. Small mediastinal lymph nodes, unchanged from prior exam. No evidence of hilar adenopathy. No pericardial effusion. Lungs/Pleura: Innumerable bilateral pleural plaques, majority of which are calcified. Interval increase in the adjacent pleural thickening about the plaques in the left lower hemithorax from prior CT. Additional minimal coalescence of plaques in the right lower hemithorax. Many of the additional  plaques in the upper lungs with increased adjacent of pleural thickening. Diaphragmatic and paramediastinal plaques are grossly unchanged. There is no pleural effusion. There is no consolidation to suggest pneumonia. Linear atelectasis in the lingula. No pulmonary mass. Minimal debris in the left mainstem bronchus. Upper abdomen: No acute abnormality.  Small hiatal hernia. Musculoskeletal: There are no acute or suspicious osseous abnormalities. IMPRESSION: 1. Bilateral calcified pleural plaques. Many of the plaques in the lower lobes dependently with mild increased adjacent pleural thickening and coalescence from prior exam. 2. Minimal debris in the left mainstem bronchus, likely mild mucous plugging. Electronically Signed   By: Jeb Levering M.D.   On: 01/01/2016 21:45        Mckenna Gamm M.D on 01/01/2016 at 10:37 PM  Between 7am to 7pm - Pager - [phone]  After 7pm go to [email or web address] - password [email or web address]  And look for the night coverage person covering me after hours  [facility] Office  [phone]

## 2016-01-01 NOTE — ED Notes (Signed)
Family wanted to take pt home, but pt not steady on her feet with family helping pt get dressed. So [facility] will be called again.

## 2016-01-01 NOTE — ED Notes (Signed)
[facility] called, family will take pt home. Pt does not have oxygen, but told family not to go get oxygen tank and that she lives 10 min away and will be fine. Pt getting dressed.

## 2016-01-01 NOTE — ED Notes (Signed)
hospitalist at bedside

## 2016-01-01 NOTE — ED Notes (Addendum)
[facility] called  
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
## 2016-01-02 LAB — CBC WITH DIFFERENTIAL/PLATELET
Basophils Absolute: 0 10*3/uL (ref 0.0–0.1)
Basophils Relative: 0 %
EOS PCT: 0 %
Eosinophils Absolute: 0 10*3/uL (ref 0.0–0.7)
HCT: 36.8 % (ref 36.0–46.0)
Hemoglobin: 11.7 g/dL — ABNORMAL LOW (ref 12.0–15.0)
LYMPHS ABS: 0.8 10*3/uL (ref 0.7–4.0)
LYMPHS PCT: 15 %
MCH: 29.5 pg (ref 26.0–34.0)
MCHC: 31.8 g/dL (ref 30.0–36.0)
MCV: 92.7 fL (ref 78.0–100.0)
MONO ABS: 0.2 10*3/uL (ref 0.1–1.0)
MONOS PCT: 3 %
Neutro Abs: 4.4 10*3/uL (ref 1.7–7.7)
Neutrophils Relative %: 82 %
PLATELETS: 176 10*3/uL (ref 150–400)
RBC: 3.97 MIL/uL (ref 3.87–5.11)
RDW: 13.2 % (ref 11.5–15.5)
WBC: 5.3 10*3/uL (ref 4.0–10.5)

## 2016-01-02 LAB — INFLUENZA PANEL BY PCR (TYPE A & B)
H1N1FLUPCR: NOT DETECTED
INFLBPCR: NEGATIVE
Influenza A By PCR: NEGATIVE

## 2016-01-02 LAB — COMPREHENSIVE METABOLIC PANEL
ALT: 14 U/L (ref 14–54)
ANION GAP: 8 (ref 5–15)
AST: 21 U/L (ref 15–41)
Albumin: 3.3 g/dL — ABNORMAL LOW (ref 3.5–5.0)
Alkaline Phosphatase: 76 U/L (ref 38–126)
BUN: 7 mg/dL (ref 6–20)
CHLORIDE: 107 mmol/L (ref 101–111)
CO2: 30 mmol/L (ref 22–32)
CREATININE: 0.67 mg/dL (ref 0.44–1.00)
Calcium: 8.4 mg/dL — ABNORMAL LOW (ref 8.9–10.3)
Glucose, Bld: 154 mg/dL — ABNORMAL HIGH (ref 65–99)
Potassium: 4 mmol/L (ref 3.5–5.1)
Sodium: 145 mmol/L (ref 135–145)
Total Bilirubin: 0.6 mg/dL (ref 0.3–1.2)
Total Protein: 6.4 g/dL — ABNORMAL LOW (ref 6.5–8.1)

## 2016-01-02 LAB — TSH: TSH: 0.539 u[IU]/mL (ref 0.350–4.500)

## 2016-01-02 LAB — MAGNESIUM: MAGNESIUM: 1.7 mg/dL (ref 1.7–2.4)

## 2016-01-02 LAB — URINE CULTURE

## 2016-01-02 LAB — PROTIME-INR
INR: 1.14 (ref 0.00–1.49)
Prothrombin Time: 14.8 seconds (ref 11.6–15.2)

## 2016-01-02 LAB — VITAMIN B12: Vitamin B-12: 349 pg/mL (ref 180–914)

## 2016-01-02 LAB — RPR: RPR Ser Ql: NONREACTIVE

## 2016-01-02 LAB — PHOSPHORUS: PHOSPHORUS: 3.4 mg/dL (ref 2.5–4.6)

## 2016-01-02 LAB — CK: CK TOTAL: 864 U/L — AB (ref 38–234)

## 2016-01-02 LAB — APTT: APTT: 37 s (ref 24–37)

## 2016-01-02 MED ORDER — TRAZODONE HCL 150 MG PO TABS
150.0000 mg | ORAL_TABLET | Freq: Once | ORAL | Status: AC
  Administered 2016-01-02: 150 mg via ORAL
  Filled 2016-01-02: qty 1

## 2016-01-02 MED ORDER — MAGNESIUM SULFATE 2 GM/50ML IV SOLN
2.0000 g | Freq: Once | INTRAVENOUS | Status: AC
  Administered 2016-01-02: 2 g via INTRAVENOUS
  Filled 2016-01-02: qty 50

## 2016-01-02 MED ORDER — SILVER SULFADIAZINE 1 % EX CREA
TOPICAL_CREAM | Freq: Two times a day (BID) | CUTANEOUS | Status: DC
  Administered 2016-01-02 – 2016-01-03 (×3): via TOPICAL
  Filled 2016-01-02: qty 85
  Filled 2016-01-02: qty 50

## 2016-01-02 MED ORDER — METRONIDAZOLE IN NACL 5-0.79 MG/ML-% IV SOLN
500.0000 mg | Freq: Three times a day (TID) | INTRAVENOUS | Status: DC
  Administered 2016-01-02 – 2016-01-03 (×4): 500 mg via INTRAVENOUS
  Filled 2016-01-02 (×4): qty 100

## 2016-01-02 NOTE — Consult Note (Signed)
WOC wound consult note Reason for Consult: Chronic, non-healing wounds of 5 weeks duration caused by sustained placement of ultrasound treatment pads. Wound type:tghermal injury Pressure Ulcer POA: No Measurement: medial:  1.4cm round with depth unable to be determined due to the presence of a necrotic (stringy grey) wound bed.  Lateral:  0.5cm round wound with depth unable to be determined due to the presence of a necrotic (stringy grey) wound bed Wound bed:As described above Drainage (amount, consistency, odor) scant brown exudate on old dressing. Periwound:intact, dry. No redness or warmth or induration Dressing procedure/placement/frequency: I will provide Nursing with guidance for performing wound care twice daily using silver sulfadiazine cream.  I feel this will provide for autolytic debridement via a moisture retentive environment as well as provide antimicrobial treatment at a local level. Patient is receiving systemic antibiotics and will require follow up and oversight.  The patient's PCP, orthopod or referral to an outpatient wound care center of her choosing is suggested for that oversight.  [place] nursing team will not follow, but will remain available to this patient, the nursing and medical teams.  Please re-consult if needed. Thanks, Maudie Flakes, MSN, RN, [place], Arther Abbott  Pager# [phone]

## 2016-01-02 NOTE — Evaluation (Signed)
Physical Therapy Evaluation Patient Details Name: Linda [name] MRN: [id number] DOB: 12/16/1946 Today's Date: 01/02/2016   History of Present Illness  70 yo female admitted 01/01/16 Encephalopathy, metabolic,UTI,CAP ,/Fall at home/Hypokalemia/Rhabdomyolysis. Patient was down apprx 13 hours before contacting someone. patient has a wound on Left medial and lateral lower leg near knee from E stim electrodes used in PT per patient./  Clinical Impression  Patient reports having vertigo at times but was not bothered today. Patient ambulated with much assist 20'x 2 with RW. Patient is unsteady, requires assist for  Mobility.  Pt admitted with above diagnosis. Pt currently with functional limitations due to the deficits listed below (see PT Problem List). Pt will benefit from skilled PT to increase their independence and safety with mobility to allow discharge to the venue listed below.       Follow Up Recommendations SNF;Supervision/Assistance - 24 hour    Equipment Recommendations  None recommended by PT    Recommendations for Other Services       Precautions / Restrictions Precautions Precautions: Fall Precaution Comments: reports that she gets vertigo      Mobility  Bed Mobility Overal bed mobility: Needs Assistance Bed Mobility: Supine to Sit     Supine to sit: Supervision     General bed mobility comments: extra time to scoot to edge, got self to upright position  Transfers Overall transfer level: Needs assistance Equipment used: Rolling walker (2 wheeled) Transfers: Sit to/from Stand Sit to Stand: Mod assist;+2 physical assistance;+2 safety/equipment         General transfer comment: assist to steady  stnading after standing from bed.  Did plop down on to toilet upon attempt to stand. Use of rail to pull self up.  Ambulation/Gait Ambulation/Gait assistance: Mod assist;+2 physical assistance;+2 safety/equipment Ambulation Distance (Feet):  (15' x 2) Assistive  device: Rolling walker (2 wheeled) Gait Pattern/deviations: Step-to pattern;Trunk flexed;Wide base of support;Decreased stride length;Step-through pattern;Decreased weight shift to left Gait velocity: slow   General Gait Details: both knees flexed during gait. assist to steady and very close guard for knee buckling potential.  Stairs            Wheelchair Mobility    Modified Rankin (Stroke Patients Only)       Balance Overall balance assessment: History of Falls;Needs assistance Sitting-balance support: Feet supported;No upper extremity supported Sitting balance-Leahy Scale: Fair Sitting balance - Comments: able to lean forward and manipulate depends   Standing balance support: During functional activity;Bilateral upper extremity supported Standing balance-Leahy Scale: Poor                               Pertinent Vitals/Pain Pain Assessment: 0-10 Pain Score: 7  Pain Location: legs , ribs    Home Living Family/patient expects to be discharged to::  (independent living) Living Arrangements: Alone Available Help at Discharge: Friend(s);Family;Available PRN/intermittently Type of Home: Apartment Home Access: Level entry     Home Layout: One level Home Equipment: Cane - single point;Shower seat;Grab bars - toilet;Grab bars - tub/shower;Hand held shower head;Walker - 4 wheels;Other (comment) Additional Comments: Uses rollator for ambulation    Prior Function Level of Independence: Independent with assistive device(s)         Comments: still drives     Hand Dominance        Extremity/Trunk Assessment   Upper Extremity Assessment: Generalized weakness           Lower Extremity Assessment:  RLE deficits/detail;LLE deficits/detail RLE Deficits / Details: lacks 20 degrees of knee extension LLE Deficits / Details: similar to R. has a dressing on the lower leg.  Cervical / Trunk Assessment: Kyphotic  Communication   Communication: No  difficulties  Cognition Arousal/Alertness: Awake/alert Behavior During Therapy: WFL for tasks assessed/performed;Flat affect Overall Cognitive Status: Within Functional Limits for tasks assessed                      General Comments      Exercises        Assessment/Plan    PT Assessment Patient needs continued PT services  PT Diagnosis Difficulty walking;Generalized weakness;Acute pain   PT Problem List Decreased strength;Decreased range of motion;Decreased activity tolerance;Decreased balance;Decreased mobility;Decreased knowledge of precautions;Decreased safety awareness;Decreased knowledge of use of DME  PT Treatment Interventions DME instruction;Gait training;Functional mobility training;Therapeutic activities;Therapeutic exercise   PT Goals (Current goals can be found in the Care Plan section) Acute Rehab PT Goals Patient Stated Goal: to go home  PT Goal Formulation: With patient/family Time For Goal Achievement: 01/16/16 Potential to Achieve Goals: Good    Frequency Min 3X/week   Barriers to discharge        Co-evaluation               End of Session Equipment Utilized During Treatment: Gait belt Activity Tolerance: Patient tolerated treatment well Patient left: in chair;with call bell/phone within reach;with chair alarm set Nurse Communication: Mobility status         Time: 1538-1610 PT Time Calculation (min) (ACUTE ONLY): 32 min   Charges:   PT Evaluation $PT Eval Low Complexity: 1 Procedure PT Treatments $Gait Training: 8-22 mins   PT G Codes:        Taiba, Castorina 01/02/2016, 4:35 PM Tresa Endo PT [phone]

## 2016-01-02 NOTE — Progress Notes (Signed)
[facility] PROGRESS NOTE  [name] [id number] DOB: 25-Mar-1946 DOA: 01/01/2016 PCP: Tammi Sou, MD  Assessment&Care Plan Day of Admission 01/01/16 Linda [name] is a pleasant 70 year old female who lives alone and has multiple medical problems, including chronic pain syndrome/OSA/Chronicrespiratory failure/Essential Htn/lung fibrosis/chronic diastolic CHF/?polypharmacy who comes in after she fell at home and she lay on the floor for 13 hours or so as she was too weak to get herself to the phone to call for help, and she seems to have Acute metabolic encephalopathy resulting from UTI/Pneumonia which caused her to fall in the setting of psychoactive medications. There is possibility of Influenza. Patient also has mild rhabdomyolysis from lying on the floor for 12 hours or so. Her white count is is 7.400, potassium 2.8, CPK 880. CT chest shows "1. Bilateral calcified pleural plaques. Many of the plaques in the lower lobes dependently with mild increased adjacent pleural thickening and coalescence from prior exam. 2. Minimal debris in the left mainstem bronchus, likely mild mucous plugging". Patient will be admitted for further management including gentle hydration, electrolyte replacement, and empiric antibiotics. Patient will need psychoactive medications streamlined. Will also check vitamin b12/tsh/rpr/folate. Will request PT/Case management to for discharge logistics. I discussed the care plan with patient's son and daughter in law at bedside. She is full code. Overnight events and other developments 01/02/16: Influenza swab negative. Low index of suspicion for influenza. Will DC Tamiflu and droplet precautions. TSH normal. Urine culture no growth so far. We'll continue Levaquin/IV fluids/add Flagyl for possible aspiration pneumonitis(debris left main bronchus) and follow vitamin [id number] level/RPR/folic acid level and physical therapy recommendations. Patient will need her psychoactive  medications streamlined. Will defer this to her primary care providers. Plan Encephalopathy, metabolic/UTI (urinary tract infection)/CAP (community acquired pneumonia)/Fall at home/Hypokalemia/Rhabdomyolysis  Follow septic work up including Urine culture/influenza swab  Vitamin b12 level/rpr/folate  Day 2 Levaquin  D/c Tamiflu  Continue IVF  Flagyl   Physical therapy evaluation Polypharmacy/Generalized anxiety disorder--with occasional panic attacks/Recurrent major depression-severe (HCC)/Chronic pain syndrome  Continue home meds and defer long term management to patient's primary care providers. Psychoactive Medications maybe contributing to recurrent falls. OSA (obstructive sleep apnea)/Fibrositis/Chronic respiratory failure (HCC)  O2 as needed/bronchodilators as needed Essential HTN (hypertension), benign  BP stable  Continue home meds DVT/GI Prophylaxis  Lovenox  PPI Family Communication: Son and sister in law at bedside  Code Status   Full Code  Likely DC  ?Home  Consultants:  None  Procedures:  None  Antibiotics:  Levaquin 01/01/16>  01/01/2016> 01/02/2016  Flagyl 01/02/2016>  HPI/Subjective: Feels better. Worried about pain.  Objective: Filed Vitals:   01/01/16 2157 01/02/16 0458  BP: 131/84 127/55  Pulse:  70  Temp: 98.2 F (36.8 C) 98.4 F (36.9 C)  Resp: 16 16    Intake/Output Summary (Last 24 hours) at 01/02/16 0755 Last data filed at 01/02/16 0600  Gross per 24 hour  Intake    600 ml  Output    600 ml  Net      0 ml   There were no vitals filed for this visit.  Exam:   General:  Comfortable at rest.  Cardiovascular: S1-S2 normal. No murmurs. Pulse regular.  Respiratory: Good air entry bilaterally. No rhonchi or rales.  Abdomen: Soft and nontender. Normal bowel sounds. No organomegaly.  Musculoskeletal: No pedal edema   Neurological: Intact  Data Reviewed: Basic Metabolic Panel:  Recent Labs Lab  01/01/16 1127 01/02/16 0610  NA 144 145  K  2.8* 4.0  CL 101 107  CO2 31 30  GLUCOSE 118* 154*  BUN 7 7  CREATININE 0.62 0.67  CALCIUM 9.4 8.4*  MG  --  1.7  PHOS  --  3.4   Liver Function Tests:  Recent Labs Lab 01/01/16 1127 01/02/16 0610  AST 23 21  ALT 15 14  ALKPHOS 100 76  BILITOT 0.9 0.6  PROT 7.8 6.4*  ALBUMIN 4.2 3.3*   No results for input(s): LIPASE, AMYLASE in the last 168 hours. No results for input(s): AMMONIA in the last 168 hours. CBC:  Recent Labs Lab 01/01/16 1127 01/02/16 0610  WBC 7.4 5.3  NEUTROABS  --  4.4  HGB 14.2 11.7*  HCT 41.3 36.8  MCV 87.5 92.7  PLT 183 176   Cardiac Enzymes:  Recent Labs Lab 01/01/16 1127  CKTOTAL 880*   BNP (last 3 results)  Recent Labs  10/16/15 1500  BNP 15.4    ProBNP (last 3 results)  Recent Labs  08/24/15 1105  PROBNP 92.0    CBG: No results for input(s): GLUCAP in the last 168 hours.  No results found for this or any previous visit (from the past 240 hour(s)).   Studies: Dg Chest 1 View  01/01/2016  CLINICAL DATA:  Fall last evening, on the ground for 12+ hours without oxygen. EXAM: CHEST 1 VIEW COMPARISON:  Radiographs and CT 10/17/2015 FINDINGS: Bilateral calcified pleural parenchymal plaques, unchanged from prior exam. Borderline cardiomegaly with atherosclerosis of the thoracic aorta. Mild atelectasis at the left lung base. No confluent airspace disease, large pleural effusion or pneumothorax. No pulmonary edema. No acute osseous abnormalities are seen. IMPRESSION: 1. No active disease. 2. Stable appearance of bilateral calcified pleural plaques. Electronically Signed   By: Jeb Levering M.D.   On: 01/01/2016 18:16   Ct Head Wo Contrast  01/01/2016  CLINICAL DATA:  Pain following fall EXAM: CT HEAD WITHOUT CONTRAST TECHNIQUE: Contiguous axial images were obtained from the base of the skull through the vertex without intravenous contrast. COMPARISON:  September 13, 2015 FINDINGS: There  is age related volume loss. There is no intracranial mass, hemorrhage, extra-axial fluid collection, or midline shift. A small focus of decreased attenuation in the medial right temporal lobe is stable in may represent a small prior infarct or possibly an asymmetric deep sulcus. Gray-white compartments otherwise appear normal. No acute infarct evident. The bony calvarium appears intact. The visualized mastoid air cells are clear. No intraorbital lesions are appreciable. IMPRESSION: Age related volume loss. Question prior small right medial temporal lobe infarct versus deep sulcus in this area. This finding is stable. No intracranial mass, hemorrhage, or new focal gray -white compartment lesion to suggest acute infarct. Electronically Signed   By: Lowella Grip III M.D.   On: 01/01/2016 17:53   Ct Chest Wo Contrast  01/01/2016  CLINICAL DATA:  Cough. EXAM: CT CHEST WITHOUT CONTRAST TECHNIQUE: Multidetector CT imaging of the chest was performed following the standard protocol without IV contrast. COMPARISON:  Radiograph earlier this day.  Chest CT 10/17/2015 FINDINGS: Mediastinum/Lymph Nodes: Normal caliber thoracic aorta with mild atherosclerosis of the arch. Coronary stent versus coronary artery calcifications. The heart is normal in size. Small mediastinal lymph nodes, unchanged from prior exam. No evidence of hilar adenopathy. No pericardial effusion. Lungs/Pleura: Innumerable bilateral pleural plaques, majority of which are calcified. Interval increase in the adjacent pleural thickening about the plaques in the left lower hemithorax from prior CT. Additional minimal coalescence of plaques in the right  lower hemithorax. Many of the additional plaques in the upper lungs with increased adjacent of pleural thickening. Diaphragmatic and paramediastinal plaques are grossly unchanged. There is no pleural effusion. There is no consolidation to suggest pneumonia. Linear atelectasis in the lingula. No pulmonary mass.  Minimal debris in the left mainstem bronchus. Upper abdomen: No acute abnormality.  Small hiatal hernia. Musculoskeletal: There are no acute or suspicious osseous abnormalities. IMPRESSION: 1. Bilateral calcified pleural plaques. Many of the plaques in the lower lobes dependently with mild increased adjacent pleural thickening and coalescence from prior exam. 2. Minimal debris in the left mainstem bronchus, likely mild mucous plugging. Electronically Signed   By: Jeb Levering M.D.   On: 01/01/2016 21:45    Scheduled Meds: . ARIPiprazole  5 mg Oral Daily  . aspirin EC  81 mg Oral Daily  . budesonide-formoterol  2 puff Inhalation BID  . DULoxetine  30 mg Oral Daily  . enoxaparin (LOVENOX) injection  40 mg Subcutaneous Q24H  . folic acid  2 mg Oral Daily  . furosemide  20 mg Oral Daily  . guaiFENesin  600 mg Oral BID  . isosorbide mononitrate  30 mg Oral Daily  . levofloxacin (LEVAQUIN) IV  750 mg Intravenous Q24H  . magnesium sulfate 1 - 4 g bolus IVPB  2 g Intravenous Once  . multivitamin with minerals  1 tablet Oral Daily  . nebivolol  5 mg Oral Daily  . oseltamivir  75 mg Oral BID  . pantoprazole  40 mg Oral Q1200  . polyethylene glycol  17 g Oral Daily  . polyvinyl alcohol  1 drop Both Eyes Daily  . potassium chloride SA  40 mEq Oral Daily  . predniSONE  20 mg Oral Daily  . pregabalin  300 mg Oral Daily   Continuous Infusions: . dextrose 5 % and 0.9 % NaCl with KCl 20 mEq/L 75 mL/hr at 01/02/16 0600     Time spent: 25 minutes    Khamani Daniely  [facility] Pager [phone]. If 7PM-7AM, please contact night-coverage at [email or web address], password [facility] 01/02/2016, 7:55 AM  LOS: 1 day

## 2016-01-02 NOTE — Care Management Note (Signed)
Case Management Note  Patient Details  Name: FRANNIE AVERY MRN: [id number] Date of Birth: 12-Sep-1946  Subjective/Objective:               rhabo with fall and confusion. Action/Plan:Pt lives in independent living at [facility]. She has ot and pt thru [facility]. Please follow for discharge needs. Date: January 02, 2016 Chart reviewed for concurrent status and case management needs. Will continue to follow patient for changes and needs: Velva Harman, RN, BSN, [place]   [phone]  Expected Discharge Date:   (other)               Expected Discharge Plan:  Home/Self Care  In-House Referral:  NA  Discharge planning Services  CM Consult  Post Acute Care Choice:  NA Choice offered to:  NA  DME Arranged:    DME Agency:     HH Arranged:    HH Agency:     Status of Service:  In process, will continue to follow  Medicare Important Message Given:    Date Medicare IM Given:    Medicare IM give by:    Date Additional Medicare IM Given:    Additional Medicare Important Message give by:     If discussed at [place] of Stay Meetings, dates discussed:    Additional Comments:  Leeroy Cha, RN 01/02/2016, 11:34 AM

## 2016-01-03 DIAGNOSIS — J9610 Chronic respiratory failure, unspecified whether with hypoxia or hypercapnia: Secondary | ICD-10-CM

## 2016-01-03 DIAGNOSIS — I10 Essential (primary) hypertension: Secondary | ICD-10-CM

## 2016-01-03 DIAGNOSIS — G9341 Metabolic encephalopathy: Secondary | ICD-10-CM

## 2016-01-03 DIAGNOSIS — Z79899 Other long term (current) drug therapy: Secondary | ICD-10-CM

## 2016-01-03 LAB — COMPREHENSIVE METABOLIC PANEL
ALBUMIN: 3.3 g/dL — AB (ref 3.5–5.0)
ALK PHOS: 64 U/L (ref 38–126)
ALT: 12 U/L — ABNORMAL LOW (ref 14–54)
AST: 24 U/L (ref 15–41)
Anion gap: 8 (ref 5–15)
BILIRUBIN TOTAL: 0.6 mg/dL (ref 0.3–1.2)
BUN: 9 mg/dL (ref 6–20)
CALCIUM: 8.5 mg/dL — AB (ref 8.9–10.3)
CO2: 28 mmol/L (ref 22–32)
Chloride: 107 mmol/L (ref 101–111)
Creatinine, Ser: 0.75 mg/dL (ref 0.44–1.00)
GFR calc Af Amer: >60 mL/min (ref >60)
GLUCOSE: 134 mg/dL — AB (ref 65–99)
Potassium: 3.8 mmol/L (ref 3.5–5.1)
Sodium: 143 mmol/L (ref 135–145)
TOTAL PROTEIN: 6 g/dL — AB (ref 6.5–8.1)

## 2016-01-03 LAB — HEMOGLOBIN A1C
Hgb A1c MFr Bld: 5.6 % (ref 4.8–5.6)
MEAN PLASMA GLUCOSE: 114 mg/dL

## 2016-01-03 LAB — CBC
HEMATOCRIT: 34.9 % — AB (ref 36.0–46.0)
HEMOGLOBIN: 11.1 g/dL — AB (ref 12.0–15.0)
MCH: 28.9 pg (ref 26.0–34.0)
MCHC: 31.8 g/dL (ref 30.0–36.0)
MCV: 90.9 fL (ref 78.0–100.0)
Platelets: 158 10*3/uL (ref 150–400)
RBC: 3.84 MIL/uL — ABNORMAL LOW (ref 3.87–5.11)
RDW: 13.1 % (ref 11.5–15.5)
WBC: 7.8 10*3/uL (ref 4.0–10.5)

## 2016-01-03 LAB — FOLATE RBC
FOLATE, HEMOLYSATE: 339.7 ng/mL
Folate, RBC: 988 ng/mL (ref >498)
HEMATOCRIT: 34.4 % (ref 34.0–46.6)

## 2016-01-03 LAB — PHOSPHORUS: Phosphorus: 3.6 mg/dL (ref 2.5–4.6)

## 2016-01-03 LAB — MAGNESIUM: Magnesium: 2 mg/dL (ref 1.7–2.4)

## 2016-01-03 MED ORDER — LEVOFLOXACIN 750 MG PO TABS: 750.0000 mg | ORAL_TABLET | Freq: Every day | ORAL | Status: DC

## 2016-01-03 MED ORDER — METRONIDAZOLE 500 MG PO TABS
500.0000 mg | ORAL_TABLET | Freq: Three times a day (TID) | ORAL | Status: DC
  Filled 2016-01-03 (×3): qty 1

## 2016-01-03 MED ORDER — OXYCODONE-ACETAMINOPHEN 10-325 MG PO TABS: 1.0000 | ORAL_TABLET | Freq: Three times a day (TID) | ORAL | Status: DC | PRN

## 2016-01-03 MED ORDER — LEVOFLOXACIN 750 MG PO TABS
750.0000 mg | ORAL_TABLET | Freq: Every day | ORAL | Status: DC
  Filled 2016-01-03: qty 1

## 2016-01-03 MED ORDER — FUROSEMIDE 40 MG PO TABS: 20.0000 mg | ORAL_TABLET | Freq: Every day | ORAL | Status: DC

## 2016-01-03 MED ORDER — ALPRAZOLAM 0.5 MG PO TABS: 0.5000 mg | ORAL_TABLET | Freq: Two times a day (BID) | ORAL | Status: DC | PRN

## 2016-01-03 MED ORDER — PREGABALIN 300 MG PO CAPS: 300.0000 mg | ORAL_CAPSULE | Freq: Every day | ORAL | Status: DC

## 2016-01-03 NOTE — Discharge Summary (Signed)
Linda [name], is a 70 y.o. female  DOB [birth year]  MRN [id number].  Admission date:  01/01/2016  Admitting Physician  Nat Math, MD  Discharge Date:  01/03/2016   Primary MD  Tammi Sou, MD  Recommendations for primary care physician for things to follow:  - Check CBC, BMP in 3 days. - repeat 2 view chest x-ray in 3 days  Admission Diagnosis  Cough [R05] Hypokalemia [E87.6] Weakness [R53.1] Fall [W19.XXXA] Gait instability [R26.81] Acute UTI [N39.0] Bacteria in urine [R82.71] Severe muscle deconditioning [R29.898] Accident due to mechanical fall without injury [W19.XXXA]   Discharge Diagnosis  Cough [R05] Hypokalemia [E87.6] Weakness [R53.1] Fall [W19.XXXA] Gait instability [R26.81] Acute UTI [N39.0] Bacteria in urine [R82.71] Severe muscle deconditioning [R29.898] Accident due to mechanical fall without injury [W19.XXXA]   Principal Problem:   Encephalopathy, metabolic Active Problems:   HTN (hypertension), benign   Polypharmacy   OSA (obstructive sleep apnea)   Fibrositis   Chronic respiratory failure (HCC)   Generalized anxiety disorder--with occasional panic attacks.    Recurrent major depression-severe (HCC)   Chronic pain syndrome   Hypokalemia   UTI (urinary tract infection)   CAP (community acquired pneumonia)   Fall at home   Rhabdomyolysis      Past Medical History  Diagnosis Date  . HTN (hypertension)   . Depression   . Recurrent UTI     hx of hospitalization for pyelonephritis; started abx prophylaxis 06/2015  . Hay fever   . Mixed incontinence urge and stress   . Diverticular disease   . Insomnia   . Fibromyalgia     Patient states dx was around her late 56s but she had sx's for years prior to this.  . Syncope     Hypotensive; ED visit--Dr. Terrence Dupont did Cath--nonobstructive CAD, EF 55-60%.  In retrospect, suspect pt rx med misuse/polypharmacy   . Idiopathic angio-edema-urticaria 72014    Angioedema component was very minimal  . Asthma     w/ asbestososis   . History of pneumonia     hospitalized 12/2011, 02/2013, and 07/2013 [facility]) for this  . Anginal pain ([place])     Nonobstructive CAD 2014; however, her cardiologist put her on a statin for this and NOT for hyperlipidemia per pt report.  . OSA on CPAP     prior to move to [place]--had another sleep study 10/2015 w/pulm Dr. Camillo Flaming.  . H/O hiatal hernia   . Migraine syndrome     "not as often anymore; used to be ~ q wk" (07/12/2013)  . Tension headache, chronic   . DDD (degenerative disc disease)     lumbar and cervical.   . Osteoarthritis     "severe; progressing fast" (07/12/2013); multiple joints-not surgical candidate for TKR (03/2015)  . Chronic lower back pain   . Anxiety     panic attacks  . Nephrolithiasis     "passed all on my own or they are still in there" (07/12/2013)  . Pyelonephritis     "several times over the  last 30 yr" (07/12/2013)  . Diastolic congestive heart failure ([place])   . COPD (chronic obstructive pulmonary disease) ([place])   . Pulmonary embolism ([place]) 07/2013    Dx at [facility] with very small peripheral upper lobe pe 07/2013: pt took coumadin for about 8-9 mo  . Pleural plaque with presence of asbestos 07/22/2013  . BPPV (benign paroxysmal positional vertigo) 12/16/2012  . RBBB (right bundle branch block)   . Acute upper GI bleed 06/2014    while pt taking coumadin, plavix, and meloxicam---despite being told not to take coumadin.  . Iron deficiency anemia     Hematologist in [place], [place] did extensive w/u; no cause found; failed oral supplement;; gets fairly regular (q63m or so) IV iron infusions (Venofer -iron sucrose- 200mg  with procrit.  "for 14 yr I've been getting blood work q month & getting infusions prn" (07/12/2013).  Dr. Marin Olp locally, iron infusions done, EPO deficiency dx'd  . Pernicious anemia 08/24/2014    Past Surgical History  Procedure Laterality Date   . Appendectomy  1960  . Total abdominal hysterectomy  1974  . Tendon release  1996    Right forearm and hand  . Knee surgery  2005  . Heel spur surgery Left 2008  . Plantar fascia release Left 2008  . Axillary surgery Left 1978    Multiple "lump" in armpit per pt  . Coccyx removal  1972  . Cardiac catheterization  01/2013    nonobstructive CAD, EF 55-60%  . Transthoracic echocardiogram  01/2013; 04/2014;08/2015    2014--NORMAL.  2015--focal basal septal hypertrophy, EF 55-60%, grade I diast dysfxn, mild LAE.  08/2015 EF 55-60%, nl LV syst fxn, grade I DD, valves wnl  . Dilation and curettage of uterus  ? 1970's  . Eye surgery Left 2012-2013    "injections for ~ 1 yr; don't really know what for" (07/12/2013)  . Spirometry  04/25/14    In hosp for acute asthma/COPD flare: mixed obstructive and restrictive lung disease. The FEV1 is severely reduced at 45% predicted.  FEV1 signif decreased compared to prior spirometry 07/23/13.  [name] Kitchen Esophagogastroduodenoscopy N/A 07/19/2014    Gastritis found + in the setting of supratherapeutic INR, +plavix, + meloxicam.  . Left heart catheterization with coronary angiogram N/A 01/30/2013    Procedure: LEFT HEART CATHETERIZATION WITH CORONARY ANGIOGRAM;  Surgeon: Clent Demark, MD;  Location: [facility] CATH LAB;  Service: Cardiovascular;  Laterality: N/A;  . Cardiovascular stress test  02/22/15    Low risk myocard perf imaging; wall motion normal, normal EF       History of present illness and  Hospital Course:     Kindly see H&P for history of present illness and admission details, please review complete Labs, Consult reports and Test reports for all details in brief  HPI  from the history and physical done on the day of admission 01/01/2016 Linda [name] is a pleasant 70 year old female who lives alone and has multiple medical problems, including chronic pain syndrome/OSA/Chronicrespiratory failure/Essential Htn/lung fibrosis/chronic diastolic CHF/?polypharmacy who  comes in after she fell at home and she lay on the floor for 13 hours or so as she was too weak to get herself to the phone to call for help, and she seems to have Acute metabolic encephalopathy resulting from UTI/Pneumonia which caused her to fall in the setting of psychoactive medications. There is possibility of Influenza. Patient also has mild rhabdomyolysis from lying on the floor for 12 hours or so. Her white count is  is 7.400, potassium 2.8, CPK 880. CT chest shows "1. Bilateral calcified pleural plaques. Many of the plaques in the lower lobes dependently with mild increased adjacent pleural thickening and coalescence from prior exam. 2. Minimal debris in the left mainstem bronchus, likely mild mucous plugging". Patient will be admitted for further management including gentle hydration, electrolyte replacement, and empiric antibiotics. Patient will need psychoactive medications streamlined. Will also check vitamin b12/tsh/rpr/folate. Will request PT/Case management to for discharge logistics. I discussed the care plan with patient's son and daughter in law at bedside. She is full code.  Hospital Course   Encephalopathy - Metabolic encephalopathy secondary to polypharmacy versus infectious process ( acute bronchitis versus community-acquired pneumonia) , Hypokalemia, dehydration and Rhabdomyolysis. - Treated with levofloxacin, to finish another 3 days as an outpatient, hydrated with IV fluids, seen by PT and recommended SNF placement. - She is on multiple polypharmacy medication, her Xanax dose was decreased, Lyrica decreased , and her oxycodone was decreased as well . - Back to baseline  Polypharmacy/Generalized anxiety disorder--with occasional panic attacks/Recurrent major depression-severe (HCC)/Chronic pain syndrome - Continue home meds and defer long term management to patient's primary care providers. Psychoactive Medications maybe contributing to recurrent falls.  Chronic Respiratory  failure secondary to asthma and asbestosis - Continue with home medication, continue with oxygen  Hypertension - Continue with home meds  Discharge Condition:  stable   Follow UP  Follow-up Information    Follow up with [facility] . Call on 01/01/2016.   Why:  As needed [facility] has agreed to assist with CNA and nurse services and medication management   Contact information:   [place], [place] [address] [phone]       Follow up with Tammi Sou, MD. Schedule an appointment as soon as possible for a visit in 1 week.   Specialty:  Family Medicine   Why:  after discharge from SNF   Contact information:   [address] [place] [address] [phone]         Discharge Instructions  and  Discharge Medications        Discharge Instructions    Diet - low sodium heart healthy    Complete by:  As directed      Discharge instructions    Complete by:  As directed   Follow with Primary MD Tammi Sou, MD in 7 days   Get CBC, CMP,  checked  by Primary MD next visit.    Activity: As tolerated with Full fall precautions use walker/cane & assistance as needed   Disposition SNF   Diet: Heart Healthy  , with feeding assistance and aspiration precautions.  For Heart failure patients - Check your Weight same time everyday, if you gain over 2 pounds, or you develop in leg swelling, experience more shortness of breath or chest pain, call your Primary MD immediately. Follow Cardiac Low Salt Diet and 1.5 lit/day fluid restriction.   On your next visit with your primary care physician please Get Medicines reviewed and adjusted.   Please request your Prim.MD to go over all Hospital Tests and Procedure/Radiological results at the follow up, please get all Hospital records sent to your Prim MD by signing hospital release before you go home.   If you experience worsening of your admission symptoms, develop shortness  of breath, life threatening emergency, suicidal or homicidal thoughts you must seek medical attention immediately by calling 911 or calling your MD  immediately  if symptoms less severe.  You Must read complete instructions/literature along with all the possible adverse reactions/side effects for all the Medicines you take and that have been prescribed to you. Take any new Medicines after you have completely understood and accpet all the possible adverse reactions/side effects.   Do not drive, operating heavy machinery, perform activities at heights, swimming or participation in water activities or provide baby sitting services if your were admitted for syncope or siezures until you have seen by Primary MD or a Neurologist and advised to do so again.  Do not drive when taking Pain medications.    Do not take more than prescribed Pain, Sleep and Anxiety Medications  Special Instructions: If you have smoked or chewed Tobacco  in the last 2 yrs please stop smoking, stop any regular Alcohol  and or any Recreational drug use.  Wear Seat belts while driving.   Please note  You were cared for by a hospitalist during your hospital stay. If you have any questions about your discharge medications or the care you received while you were in the hospital after you are discharged, you can call the unit and asked to speak with the hospitalist on call if the hospitalist that took care of you is not available. Once you are discharged, your primary care physician will handle any further medical issues. Please note that NO REFILLS for any discharge medications will be authorized once you are discharged, as it is imperative that you return to your primary care physician (or establish a relationship with a primary care physician if you do not have one) for your aftercare needs so that they can reassess your need for medications and monitor your lab values.     Increase activity slowly    Complete by:  As directed              Medication List    STOP taking these medications        atorvastatin 20 MG tablet  Commonly known as:  LIPITOR     diphenhydrAMINE 25 MG tablet  Commonly known as:  BENADRYL      TAKE these medications        albuterol 108 (90 Base) MCG/ACT inhaler  Commonly known as:  PROVENTIL HFA;VENTOLIN HFA  Inhale 2 puffs into the lungs every 6 (six) hours as needed for wheezing or shortness of breath.     ALPRAZolam 0.5 MG tablet  Commonly known as:  XANAX  Take 1 tablet (0.5 mg total) by mouth 2 (two) times daily as needed for anxiety.     ARIPiprazole 5 MG tablet  Commonly known as:  ABILIFY  Take 1 tablet (5 mg total) by mouth daily.     aspirin 81 MG EC tablet  Take 1 tablet (81 mg total) by mouth daily.     budesonide-formoterol 160-4.5 MCG/ACT inhaler  Commonly known as:  SYMBICORT  Inhale 2 puffs into the lungs 2 (two) times daily.     BYSTOLIC 5 MG tablet  Generic drug:  nebivolol  TAKE 1 TABLET ONCE DAILY.     diclofenac sodium 1 % Gel  Commonly known as:  VOLTAREN  Apply 4 g topically 3 (three) times daily. TO BOTH KNEES     DULoxetine 60 MG capsule  Commonly known as:  CYMBALTA  1 cap po qd to be combined with a 30 mg duloxetine cap daily     DULoxetine 30 MG capsule  Commonly known as:  CYMBALTA  TAKE 1 CAPSULE ONCE DAILY ALONG WITH 60MG  CAPSULE FOR TOTAL 90MG  DAILY.     folic acid 1 MG tablet  Commonly known as:  FOLVITE  Take 2 tablets (2 mg total) by mouth daily.     furosemide 40 MG tablet  Commonly known as:  LASIX  Take 0.5 tablets (20 mg total) by mouth daily.     isosorbide mononitrate 30 MG 24 hr tablet  Commonly known as:  IMDUR  TAKE 1 TABLET ONCE DAILY.     levofloxacin 750 MG tablet  Commonly known as:  LEVAQUIN  Take 1 tablet (750 mg total) by mouth daily at 6 PM.     multivitamin with minerals Tabs tablet  Take 1 tablet by mouth daily.     nitroGLYCERIN 0.4 MG SL tablet  Commonly known as:  NITROSTAT  Place 0.4 mg  under the tongue every 5 (five) minutes as needed for chest pain.     ondansetron 4 MG tablet  Commonly known as:  ZOFRAN  Take 1 tablet (4 mg total) by mouth every 6 (six) hours.     OSTEO BI-FLEX ADV JOINT SHIELD Tabs  Take 1 tablet by mouth daily.     oxyCODONE-acetaminophen 10-325 MG tablet  Commonly known as:  PERCOCET  Take 1 tablet by mouth every 8 (eight) hours as needed for pain. ONE MONTH SUPPLY     OXYGEN  Inhale 3 L into the lungs continuous.     pantoprazole 40 MG tablet  Commonly known as:  PROTONIX  Take 1 tablet (40 mg total) by mouth daily.     polyethylene glycol powder powder  Commonly known as:  GLYCOLAX/MIRALAX  Take 17 g by mouth daily.     potassium chloride SA 20 MEQ tablet  Commonly known as:  K-DUR,KLOR-CON  Take 2 tablets (40 mEq total) by mouth daily.     pregabalin 300 MG capsule  Commonly known as:  LYRICA  Take 1 capsule (300 mg total) by mouth daily. Take 1 capsule twice a day     SYSTANE 0.4-0.3 % Soln  Generic drug:  Polyethyl Glycol-Propyl Glycol  Place 1 drop into both eyes daily.     traZODone 50 MG tablet  Commonly known as:  DESYREL  Take 3 tablets (150 mg total) by mouth at bedtime.     Vitamin D3 50000 units Caps  Take 50,000 Units by mouth once a week.          Diet and Activity recommendation: See Discharge Instructions above   Consults obtained -  none   Major procedures and Radiology Reports - PLEASE review detailed and final reports for all details, in brief -      Dg Chest 1 View  01/01/2016  CLINICAL DATA:  Fall last evening, on the ground for 12+ hours without oxygen. EXAM: CHEST 1 VIEW COMPARISON:  Radiographs and CT 10/17/2015 FINDINGS: Bilateral calcified pleural parenchymal plaques, unchanged from prior exam. Borderline cardiomegaly with atherosclerosis of the thoracic aorta. Mild atelectasis at the left lung base. No confluent airspace disease, large pleural effusion or pneumothorax. No pulmonary edema.  No acute osseous abnormalities are seen. IMPRESSION: 1. No active disease. 2. Stable appearance of bilateral calcified pleural plaques. Electronically Signed   By: Jeb Levering M.D.   On: 01/01/2016 18:16   Ct Head Wo Contrast  01/01/2016  CLINICAL DATA:  Pain following fall EXAM: CT HEAD WITHOUT CONTRAST TECHNIQUE: Contiguous axial images were obtained from the base of the skull through the  vertex without intravenous contrast. COMPARISON:  September 13, 2015 FINDINGS: There is age related volume loss. There is no intracranial mass, hemorrhage, extra-axial fluid collection, or midline shift. A small focus of decreased attenuation in the medial right temporal lobe is stable in may represent a small prior infarct or possibly an asymmetric deep sulcus. Gray-white compartments otherwise appear normal. No acute infarct evident. The bony calvarium appears intact. The visualized mastoid air cells are clear. No intraorbital lesions are appreciable. IMPRESSION: Age related volume loss. Question prior small right medial temporal lobe infarct versus deep sulcus in this area. This finding is stable. No intracranial mass, hemorrhage, or new focal gray -white compartment lesion to suggest acute infarct. Electronically Signed   By: Lowella Grip III M.D.   On: 01/01/2016 17:53   Ct Chest Wo Contrast  01/01/2016  CLINICAL DATA:  Cough. EXAM: CT CHEST WITHOUT CONTRAST TECHNIQUE: Multidetector CT imaging of the chest was performed following the standard protocol without IV contrast. COMPARISON:  Radiograph earlier this day.  Chest CT 10/17/2015 FINDINGS: Mediastinum/Lymph Nodes: Normal caliber thoracic aorta with mild atherosclerosis of the arch. Coronary stent versus coronary artery calcifications. The heart is normal in size. Small mediastinal lymph nodes, unchanged from prior exam. No evidence of hilar adenopathy. No pericardial effusion. Lungs/Pleura: Innumerable bilateral pleural plaques, majority of which are  calcified. Interval increase in the adjacent pleural thickening about the plaques in the left lower hemithorax from prior CT. Additional minimal coalescence of plaques in the right lower hemithorax. Many of the additional plaques in the upper lungs with increased adjacent of pleural thickening. Diaphragmatic and paramediastinal plaques are grossly unchanged. There is no pleural effusion. There is no consolidation to suggest pneumonia. Linear atelectasis in the lingula. No pulmonary mass. Minimal debris in the left mainstem bronchus. Upper abdomen: No acute abnormality.  Small hiatal hernia. Musculoskeletal: There are no acute or suspicious osseous abnormalities. IMPRESSION: 1. Bilateral calcified pleural plaques. Many of the plaques in the lower lobes dependently with mild increased adjacent pleural thickening and coalescence from prior exam. 2. Minimal debris in the left mainstem bronchus, likely mild mucous plugging. Electronically Signed   By: Jeb Levering M.D.   On: 01/01/2016 21:45    Micro Results     Recent Results (from the past 240 hour(s))  Urine culture     Status: None   Collection Time: 01/01/16  1:24 PM  Result Value Ref Range Status   Specimen Description URINE, CLEAN CATCH  Final   Special Requests NONE  Final   Culture   Final    >=100,000 COLONIES/mL AEROCOCCUS URINAE Standardized susceptibility testing for this organism is not available. Performed at [facility]    Report Status 01/02/2016 FINAL  Final       Today   Subjective:   Linda [name] today has no headache,no chest abdominal pain, reports poor appetite and generalized weakness.  Objective:   Blood pressure 147/75, pulse 66, temperature 98 F (36.7 C), temperature source Oral, resp. rate 18, SpO2 96 %.   Intake/Output Summary (Last 24 hours) at 01/03/16 1307 Last data filed at 01/03/16 0900  Gross per 24 hour  Intake   2585 ml  Output    800 ml  Net   1785 ml    Exam  General:  Comfortable at rest.  Cardiovascular: S1-S2 normal. No murmurs. Pulse regular.  Respiratory: Good air entry bilaterally. No rhonchi or rales.  Abdomen: Soft and nontender. Normal bowel sounds. No organomegaly.  Musculoskeletal: No pedal  edema   Neurological: Intact  Data Review   CBC w Diff:  Lab Results  Component Value Date   WBC 7.8 01/03/2016   WBC 10.8* 06/05/2015   HGB 11.1* 01/03/2016   HGB 13.6 06/05/2015   HCT 34.9* 01/03/2016   HCT 40.8 06/05/2015   PLT 158 01/03/2016   PLT 242 06/05/2015   LYMPHOPCT 15 01/02/2016   LYMPHOPCT 22.1 06/05/2015   MONOPCT 3 01/02/2016   MONOPCT 6.6 06/05/2015   EOSPCT 0 01/02/2016   EOSPCT 0.3 06/05/2015   BASOPCT 0 01/02/2016   BASOPCT 0.1 06/05/2015    CMP:  Lab Results  Component Value Date   NA 143 01/03/2016   NA 141 06/05/2015   K 3.8 01/03/2016   K 3.9 06/05/2015   CL 107 01/03/2016   CL 103 06/05/2015   CO2 28 01/03/2016   CO2 28 06/05/2015   BUN 9 01/03/2016   BUN 14 06/05/2015   CREATININE 0.75 01/03/2016   CREATININE 0.91 10/27/2015   PROT 6.0* 01/03/2016   PROT 7.5 06/05/2015   ALBUMIN 3.3* 01/03/2016   ALBUMIN 3.8 06/05/2015   BILITOT 0.6 01/03/2016   BILITOT 1.00 06/05/2015   ALKPHOS 64 01/03/2016   ALKPHOS 84 06/05/2015   AST 24 01/03/2016   AST 23 06/05/2015   ALT 12* 01/03/2016   ALT 17 06/05/2015  .   Total Time in preparing paper work, data evaluation and todays exam - 35 minutes  Annalicia Renfrew M.D on 01/03/2016 at 1:07 PM  [facility]   Office  [phone]

## 2016-01-03 NOTE — Progress Notes (Signed)
Spoke with pt's daughter Lynelle Smoke [phone], concerning HH. [name] states pt was active with [facility], and her brother will call agency for Private duty. Referral given to in house rep with [place].

## 2016-01-03 NOTE — Progress Notes (Signed)
Pt discharged from the unit via wheelchair. Discharge instructions were reviewed with th patient. Home health was set up with the facility. No questions or concerns from the pt at this time. Linda [name] Linda [name] [name], RN

## 2016-01-03 NOTE — Clinical Social Work Note (Signed)
Clinical Social Work Assessment  Patient Details  Name: Linda [name] MRN: [id number] Date of Birth: May 16, 1946  Date of referral:  01/03/16               Reason for consult:                   Permission sought to share information with:  Facility Sport and exercise psychologist, Family Supports Permission granted to share information::     Name::        Agency::     Relationship::     Contact Information:     Housing/Transportation Living arrangements for the past 2 months:  Charity fundraiser of Information:  Patient Patient Interpreter Needed:  None Criminal Activity/Legal Involvement Pertinent to Current Situation/Hospitalization:  No - Comment as needed Significant Relationships:  Adult Children Lives with:  Self Do you feel safe going back to the place where you live?  Yes Need for family participation in patient care:  No (Coment)  Care giving concerns: Pt is from [facility] [place] and plans to return to the facility. Pt receives daily OT and PT from the facility [private pay] and plans to continue this service upon d/c. This information was confirmed with the pt's daughter, Lynelle Smoke [phone].  Social Worker assessment / plan:  CSW met with pt at bedside and spoke with pt's daughter Lynelle Smoke over the phone. Both pt and family are realistic regarding needs and have a plan in place for PT/OT needs. CSW left a message with Threasa Beards at [facility], [phone] to make a plan to pt's return home as d/c may happen today. CSW will continue to follow for d/c needs.   Employment status:  Retired Forensic scientist:  Medicare PT Recommendations:  [place] / Referral to community resources:     Patient/Family's Response to care: Pt expressed gratitude regarding the care she has received at the hospital, however she's nervous about a possible d/c today as she continues to feel dizzy due to vertigo [per her report]. CSW and unit RN agree  to communicate this information to the attending. Pt is ready to return to ALF once she's no longer dizzy.  Patient/Family's Understanding of and Emotional Response to Diagnosis, Current Treatment, and Prognosis: Pt is realistic regarding her current needs and has a plan in place to address PT/OT needs while living at [facility]. Pt will work with a private therapist daily to address weakness and knee pain.   Emotional Assessment Appearance:  Appears stated age Attitude/Demeanor/Rapport:    Affect (typically observed):  Accepting, Adaptable Orientation:  Oriented to Self, Oriented to Place, Oriented to  Time, Oriented to Situation Alcohol / Substance use:  Not Applicable Psych involvement (Current and /or in the community):  No (Comment)  Discharge Needs  Concerns to be addressed:  No discharge needs identified Readmission within the last 30 days:  No Current discharge risk:  None Barriers to Discharge:  No Barriers Identified   Linna Darner, LCSW 01/03/2016, 10:33 AM

## 2016-01-03 NOTE — Progress Notes (Signed)
PHARMACIST - PHYSICIAN COMMUNICATION CONCERNING: Antibiotic IV to Oral Route Change Policy  RECOMMENDATION: This patient is receiving Flagyl/Levaquin by the intravenous route.  Based on criteria approved by the Pharmacy and Therapeutics Committee, the antibiotic(s) is/are being converted to the equivalent oral dose form(s).   DESCRIPTION: These criteria include:  Patient being treated for a respiratory tract infection, urinary tract infection, cellulitis or clostridium difficile associated diarrhea if on metronidazole  The patient is not neutropenic and does not exhibit a GI malabsorption state  The patient is eating (either orally or via tube) and/or has been taking other orally administered medications for a least 24 hours  The patient is improving clinically and has a Tmax < 100.5  If you have questions about this conversion, please contact the Pharmacy Department  []   [phone] )  [facility] []   [phone] )  [facility] []   [phone] )  [facility] []   [phone] )  [facility] [x]   [phone] )  [place], PharmD, [place] Pager [phone] 01/03/2016 12:35 PM

## 2016-01-03 NOTE — NC FL2 (Signed)
[place] LEVEL OF CARE SCREENING TOOL     IDENTIFICATION  Patient Name: Linda [name] Birthdate: November 14, 1946 Sex: female Admission Date (Current Location): 01/01/2016  [facility] and [place] Number:  Herbalist and Address:  [facility],  [place] [address] [place]      Provider Number: [id number]  Attending Physician Name and Address:  Albertine Patricia, MD  Relative Name and Phone Number:       Current Level of Care: Hospital Recommended Level of Care: [place] Prior Approval Number:    Date Approved/Denied:   PASRR Number:    Discharge Plan: Other (Comment) (ALF)    Current Diagnoses: Patient Active Problem List   Diagnosis Date Noted  . UTI (urinary tract infection) 01/01/2016  . Encephalopathy, metabolic 99991111  . CAP (community acquired pneumonia) 01/01/2016  . Fall at home 01/01/2016  . Rhabdomyolysis 01/01/2016  . Acute on chronic diastolic CHF (congestive heart failure), NYHA class 1 ([place]) 10/17/2015  . Diastolic heart failure ([place]) 10/16/2015  . CAD (coronary artery disease) 08/16/2015  . Chest pain 08/16/2015  . Narrowing of intervertebral disc space 07/17/2015  . Bilateral lower leg pain 01/24/2015  . 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 Chronic pain syndrome 11/21/2014  . Anxiety and depression 11/21/2014  . Abnormal grief reaction 11/21/2014  . Severe persistent asthma 11/21/2014  . Hypokalemia 11/21/2014  . Hyperlipidemia 11/21/2014  . Osteoarthritis of right knee 10/18/2014  . Pernicious anemia 08/24/2014  . Generalized anxiety disorder--with occasional panic attacks.  08/05/2014  . Recurrent major depression-severe ([place]) 08/05/2014  . Multifactorial gait disorder 07/26/2014  . Chronic respiratory failure ([place]) 07/23/2014  . Epistaxis 07/18/2014  . Acute GI bleeding 07/17/2014  . Anemia associated with acute blood loss 07/17/2014  . Syncope 07/17/2014  . History of  pulmonary embolism 07/17/2014  . GI bleed 07/17/2014  . Arthritis 05/11/2014  . DDD (degenerative disc disease) 05/11/2014  . Fibrositis 05/11/2014  . Amianthosis ([place]) 05/11/2014  . Grief 04/28/2014  . OSA (obstructive sleep apnea) 04/24/2014  . Chronic diastolic CHF, NYHA class 1 04/24/2014  . Acute pulmonary embolism ([place]) 08/13/2013  . Pleural plaque with presence of asbestos 07/22/2013  . Polypharmacy 04/26/2013  . Fibromyalgia syndrome 03/01/2013  . Insomnia 11/12/2012  . HTN (hypertension), benign 10/25/2012    Orientation RESPIRATION BLADDER Height & Weight    Self, Time, Situation, Place  O2 (3L) Continent 5\' 4"  (162.6 cm) 172 lbs.  BEHAVIORAL SYMPTOMS/MOOD NEUROLOGICAL BOWEL NUTRITION STATUS      Continent    AMBULATORY STATUS COMMUNICATION OF NEEDS Skin   Extensive Assist Verbally Normal                       Personal Care Assistance Level of Assistance  Bathing, Dressing Bathing Assistance: Limited assistance   Dressing Assistance: Limited assistance     Functional Limitations Info             SPECIAL CARE FACTORS FREQUENCY  PT (By licensed PT), OT (By licensed OT)     PT Frequency: 5 X weekly OT Frequency: 5 X weekly            Contractures Contractures Info: Not present    Additional Factors Info  Code Status, Allergies Code Status Info: FULL Allergies Info: Penicillins           Current Medications (01/03/2016):  This is the current hospital active medication list Current Facility-Administered Medications  Medication Dose Route Frequency Provider Last Rate Last Dose  . acetaminophen (TYLENOL) tablet 650 mg  650 mg Oral Q6H PRN Simbiso Ranga, MD       Or  . acetaminophen (TYLENOL) suppository 650 mg  650 mg Rectal Q6H PRN Simbiso Ranga, MD      . albuterol (PROVENTIL) (2.5 MG/3ML) 0.083% nebulizer solution 2.5 mg  2.5 mg Nebulization Q2H PRN Simbiso Ranga, MD      . ALPRAZolam Duanne Moron) tablet 0.5 mg  0.5 mg Oral BID PRN Simbiso  Ranga, MD   0.5 mg at 01/03/16 1003  . alum & mag hydroxide-simeth (MAALOX/MYLANTA) 200-200-20 MG/5ML suspension 30 mL  30 mL Oral Q6H PRN Simbiso Ranga, MD      . ARIPiprazole (ABILIFY) tablet 5 mg  5 mg Oral Daily Simbiso Ranga, MD   5 mg at 01/03/16 0944  . aspirin EC tablet 81 mg  81 mg Oral Daily Simbiso Ranga, MD   81 mg at 01/03/16 0945  . budesonide-formoterol (SYMBICORT) 160-4.5 MCG/ACT inhaler 2 puff  2 puff Inhalation BID Simbiso Ranga, MD   2 puff at 01/03/16 1003  . dextrose 5 % and 0.9 % NaCl with KCl 20 mEq/L infusion   Intravenous Continuous Simbiso Ranga, MD 75 mL/hr at 01/03/16 0430    . diclofenac sodium (VOLTAREN) 1 % transdermal gel 4 g  4 g Topical Daily PRN Simbiso Ranga, MD      . DULoxetine (CYMBALTA) DR capsule 30 mg  30 mg Oral Daily Simbiso Ranga, MD   30 mg at 01/03/16 0945  . enoxaparin (LOVENOX) injection 40 mg  40 mg Subcutaneous Q24H Simbiso Ranga, MD   40 mg at [id number]  . folic acid (FOLVITE) tablet 2 mg  2 mg Oral Daily Simbiso Ranga, MD   2 mg at 01/03/16 0944  . furosemide (LASIX) tablet 20 mg  20 mg Oral Daily Simbiso Ranga, MD   20 mg at 01/03/16 0945  . guaiFENesin (MUCINEX) 12 hr tablet 600 mg  600 mg Oral BID Simbiso Ranga, MD   600 mg at 01/03/16 0945  . HYDROcodone-acetaminophen (NORCO/VICODIN) 5-325 MG per tablet 1-2 tablet  1-2 tablet Oral Q4H PRN Nat Math, MD   2 tablet at 01/03/16 1003  . isosorbide mononitrate (IMDUR) 24 hr tablet 30 mg  30 mg Oral Daily Simbiso Ranga, MD   30 mg at 01/03/16 0945  . levofloxacin (LEVAQUIN) IVPB 750 mg  750 mg Intravenous Q24H Simbiso Ranga, MD 100 mL/hr at 01/02/16 1822 750 mg at 01/02/16 1822  . metroNIDAZOLE (FLAGYL) IVPB 500 mg  500 mg Intravenous Q8H Simbiso Ranga, MD   500 mg at 01/03/16 0946  . morphine 2 MG/ML injection 2 mg  2 mg Intravenous Q4H PRN Simbiso Ranga, MD   2 mg at 01/02/16 0453  . multivitamin with minerals tablet 1 tablet  1 tablet Oral Daily Simbiso Ranga, MD   1 tablet at 01/03/16  0945  . nebivolol (BYSTOLIC) tablet 5 mg  5 mg Oral Daily Simbiso Ranga, MD   5 mg at 01/03/16 0945  . nitroGLYCERIN (NITROSTAT) SL tablet 0.4 mg  0.4 mg Sublingual Q5 min PRN Simbiso Ranga, MD      . ondansetron (ZOFRAN) tablet 4 mg  4 mg Oral Q6H PRN Simbiso Ranga, MD       Or  . ondansetron (ZOFRAN) injection 4 mg  4 mg Intravenous Q6H PRN Simbiso Ranga, MD   4 mg at 01/03/16 0430  . pantoprazole (PROTONIX) EC  tablet 40 mg  40 mg Oral Q1200 Simbiso Ranga, MD   40 mg at 01/02/16 1204  . polyethylene glycol (MIRALAX / GLYCOLAX) packet 17 g  17 g Oral Daily Simbiso Ranga, MD   17 g at 01/03/16 0946  . polyvinyl alcohol (LIQUIFILM TEARS) 1.4 % ophthalmic solution 1 drop  1 drop Both Eyes Daily Simbiso Ranga, MD   1 drop at 01/03/16 0948  . potassium chloride SA (K-DUR,KLOR-CON) CR tablet 40 mEq  40 mEq Oral Daily Simbiso Ranga, MD   40 mEq at 01/03/16 0946  . predniSONE (DELTASONE) tablet 20 mg  20 mg Oral Daily Simbiso Ranga, MD   20 mg at 01/03/16 0944  . pregabalin (LYRICA) capsule 300 mg  300 mg Oral Daily Simbiso Ranga, MD   300 mg at 01/03/16 0945  . silver sulfADIAZINE (SILVADENE) 1 % cream   Topical BID Nat Math, MD       Facility-Administered Medications Ordered in Other Encounters  Medication Dose Route Frequency Provider Last Rate Last Dose  . ferumoxytol (FERAHEME) 510 mg in sodium chloride 0.9 % 100 mL IVPB  510 mg Intravenous Once Volanda Napoleon, MD         Discharge Medications: Please see discharge summary for a list of discharge medications.  Relevant Imaging Results:  Relevant Lab Results:   Additional Information SSN: [id number]  Linna Darner, LCSW

## 2016-01-04 ENCOUNTER — Telehealth: Payer: Self-pay | Admitting: Family Medicine

## 2016-01-04 NOTE — Telephone Encounter (Signed)
Transition Care Management Follow-up Telephone Call   Date discharged?   How have you been since you were released from the hospital? Pt states she feels better   Do you understand why you were in the hospital? yes   Do you understand the discharge instructions? yes   Where were you discharged to? home   Items Reviewed:  Medications reviewed: yes  Allergies reviewed: no  Dietary changes reviewed: yes, heart healthy  Referrals reviewed: no   Functional Questionnaire:   Activities of Daily Living (ADLs):   She states they are independent in the following: none States they require assistance with the following: feeding with aspiration precautions   Any transportation issues/concerns?: no   Any patient concerns? no   Confirmed importance and date/time of follow-up visits scheduled yes  Provider Appointment booked with Dr Anitra Lauth  Confirmed with patient if condition begins to worsen call PCP or go to the ER.  Patient was given the office number and encouraged to call back with question or concerns.  : yes

## 2016-01-05 DIAGNOSIS — M6281 Muscle weakness (generalized): Secondary | ICD-10-CM | POA: Diagnosis not present

## 2016-01-05 DIAGNOSIS — R296 Repeated falls: Secondary | ICD-10-CM | POA: Diagnosis not present

## 2016-01-08 ENCOUNTER — Encounter: Payer: Self-pay | Admitting: Family Medicine

## 2016-01-08 ENCOUNTER — Ambulatory Visit (INDEPENDENT_AMBULATORY_CARE_PROVIDER_SITE_OTHER): Payer: Medicare Other | Admitting: Family Medicine

## 2016-01-08 VITALS — BP 99/65 | HR 77 | Temp 98.1°F | Resp 16 | Ht 64.0 in | Wt 174.5 lb

## 2016-01-08 DIAGNOSIS — E876 Hypokalemia: Secondary | ICD-10-CM | POA: Diagnosis not present

## 2016-01-08 DIAGNOSIS — N3000 Acute cystitis without hematuria: Secondary | ICD-10-CM | POA: Diagnosis not present

## 2016-01-08 DIAGNOSIS — J189 Pneumonia, unspecified organism: Secondary | ICD-10-CM | POA: Diagnosis not present

## 2016-01-08 LAB — CBC WITH DIFFERENTIAL/PLATELET
BASOS PCT: 0.5 % (ref 0.0–3.0)
Basophils Absolute: 0 10*3/uL (ref 0.0–0.1)
EOS ABS: 0.1 10*3/uL (ref 0.0–0.7)
Eosinophils Relative: 1.7 % (ref 0.0–5.0)
HEMATOCRIT: 39.9 % (ref 36.0–46.0)
HEMOGLOBIN: 13.1 g/dL (ref 12.0–15.0)
LYMPHS PCT: 34.2 % (ref 12.0–46.0)
Lymphs Abs: 2.3 10*3/uL (ref 0.7–4.0)
MCHC: 32.8 g/dL (ref 30.0–36.0)
MCV: 88.2 fl (ref 78.0–100.0)
Monocytes Absolute: 0.6 10*3/uL (ref 0.1–1.0)
Monocytes Relative: 8.6 % (ref 3.0–12.0)
Neutro Abs: 3.7 10*3/uL (ref 1.4–7.7)
Neutrophils Relative %: 55 % (ref 43.0–77.0)
Platelets: 215 10*3/uL (ref 150.0–400.0)
RBC: 4.52 Mil/uL (ref 3.87–5.11)
RDW: 13.7 % (ref 11.5–15.5)
WBC: 6.6 10*3/uL (ref 4.0–10.5)

## 2016-01-08 LAB — BASIC METABOLIC PANEL
BUN: 13 mg/dL (ref 6–23)
CHLORIDE: 102 meq/L (ref 96–112)
CO2: 25 meq/L (ref 19–32)
Calcium: 9 mg/dL (ref 8.4–10.5)
Creatinine, Ser: 0.8 mg/dL (ref 0.40–1.20)
GFR: 75.41 mL/min (ref >60.00)
GLUCOSE: 81 mg/dL (ref 70–99)
POTASSIUM: 4.4 meq/L (ref 3.5–5.1)
Sodium: 137 mEq/L (ref 135–145)

## 2016-01-08 NOTE — Progress Notes (Signed)
01/08/2016  CC:  Chief Complaint  Patient presents with  . Hospitalization Follow-up    TCM    Patient is a 70 y.o. Caucasian female who presents for  hospital follow up, specifically Transitional Care Services face-to-face visit. Dates hospitalized: 01/01/16 to 01/03/16. Days since d/c from hospital:  5 Patient was discharged from hospital to: home/apartment with private duty nursing/CNA "several hours a day".  She says her son is always looking in on her.  She was brought to clinic today by a friend who did not come into exam room with her.  Reason for admission to hospital:  fall at home, found to have hypokalemia, UTI with metabolic encephalopathy, pneumonia, and mild rhabdomyolysis from lying on the floor for 12 hours or so.  In hosp she was hypokalemic and she had a chest CT w/o contrast which showed "1. Bilateral calcified pleural plaques. Many of the plaques in the lower lobes dependently with mild increased adjacent pleural thickening and coalescence from prior exam. 2. Minimal debris in the left mainstem bronchus, likely mild mucous plugging".   CT head w/o contrast: no acute findings. Hospitalist recommendations for f/u were CBC and BMET as well as a CXR. Date of interactive (phone) contact with patient and/or caregiver: 01/04/16  I have reviewed patient's discharge summary plus pertinent specific notes, labs, and imaging from the hospitalization.  She was d/c'd home to finish 3 additional days of levaquin.  It was stated that her xanax, lyrica, and oxycodone doses were all decreased.  However, in looking at d/c summary these appear the same as they were prior to admission.  She does not feel like she is oversedated from her medications "at all" per our discussion today.    Current status: improved but still gaining strength and appetite.  Medication reconciliation was done today and patient is taking meds as recommended by discharging hospitalist/specialist.  She is taking her lyrica  only once per day per her report today.  PMH:  Past Medical History  Diagnosis Date  . HTN (hypertension)   . Depression   . Recurrent UTI     hx of hospitalization for pyelonephritis; started abx prophylaxis 06/2015  . Hay fever   . Mixed incontinence urge and stress   . Diverticular disease   . Insomnia   . Fibromyalgia     Patient states dx was around her late 56s but she had sx's for years prior to this.  . Syncope     Hypotensive; ED visit--Dr. Terrence Dupont did Cath--nonobstructive CAD, EF 55-60%.  In retrospect, suspect pt rx med misuse/polypharmacy  . Idiopathic angio-edema-urticaria 72014    Angioedema component was very minimal  . Asthma     w/ asbestososis   . History of pneumonia     hospitalized 12/2011, 02/2013, and 07/2013 [facility]) for this  . Anginal pain ([place])     Nonobstructive CAD 2014; however, her cardiologist put her on a statin for this and NOT for hyperlipidemia per pt report.  . OSA on CPAP     prior to move to --had another sleep study 10/2015 w/pulm Dr. Camillo Flaming.  . H/O hiatal hernia   . Migraine syndrome     "not as often anymore; used to be ~ q wk" (07/12/2013)  . Tension headache, chronic   . DDD (degenerative disc disease)     lumbar and cervical.   . Osteoarthritis     "severe; progressing fast" (07/12/2013); multiple joints-not surgical candidate for TKR (03/2015)  . Chronic lower back pain   .  Anxiety     panic attacks  . Nephrolithiasis     "passed all on my own or they are still in there" (07/12/2013)  . Pyelonephritis     "several times over the last 30 yr" (07/12/2013)  . Diastolic congestive heart failure ([place])   . COPD (chronic obstructive pulmonary disease) ([place])   . Pulmonary embolism ([place]) 07/2013    Dx at [facility] with very small peripheral upper lobe pe 07/2013: pt took coumadin for about 8-9 mo  . Pleural plaque with presence of asbestos 07/22/2013  . BPPV (benign paroxysmal positional vertigo) 12/16/2012  . RBBB (right bundle branch block)   .  Acute upper GI bleed 06/2014    while pt taking coumadin, plavix, and meloxicam---despite being told not to take coumadin.  . Iron deficiency anemia     Hematologist in [place], [place] did extensive w/u; no cause found; failed oral supplement;; gets fairly regular (q47m or so) IV iron infusions (Venofer -iron sucrose- 200mg  with procrit.  "for 14 yr I've been getting blood work q month & getting infusions prn" (07/12/2013).  Dr. Marin Olp locally, iron infusions done, EPO deficiency dx'd  . Pernicious anemia 08/24/2014    PSH:  Past Surgical History  Procedure Laterality Date  . Appendectomy  1960  . Total abdominal hysterectomy  1974  . Tendon release  1996    Right forearm and hand  . Knee surgery  2005  . Heel spur surgery Left 2008  . Plantar fascia release Left 2008  . Axillary surgery Left 1978    Multiple "lump" in armpit per pt  . Coccyx removal  1972  . Cardiac catheterization  01/2013    nonobstructive CAD, EF 55-60%  . Transthoracic echocardiogram  01/2013; 04/2014;08/2015    2014--NORMAL.  2015--focal basal septal hypertrophy, EF 55-60%, grade I diast dysfxn, mild LAE.  08/2015 EF 55-60%, nl LV syst fxn, grade I DD, valves wnl  . Dilation and curettage of uterus  ? 1970's  . Eye surgery Left 2012-2013    "injections for ~ 1 yr; don't really know what for" (07/12/2013)  . Spirometry  04/25/14    In hosp for acute asthma/COPD flare: mixed obstructive and restrictive lung disease. The FEV1 is severely reduced at 45% predicted.  FEV1 signif decreased compared to prior spirometry 07/23/13.  [name] Kitchen Esophagogastroduodenoscopy N/A 07/19/2014    Gastritis found + in the setting of supratherapeutic INR, +plavix, + meloxicam.  . Left heart catheterization with coronary angiogram N/A 01/30/2013    Procedure: LEFT HEART CATHETERIZATION WITH CORONARY ANGIOGRAM;  Surgeon: Clent Demark, MD;  Location[facility] C[facility] LAB;  Service: Cardiovascular;  Laterality: N/A;  . Cardiovascular stress test  02/22/15    Low risk  myocard perf imaging; wall motion normal, normal EF    MEDS:  Outpatient Prescriptions Prior to Visit  Medication Sig Dispense Refill  . albuterol (PROVENTIL HFA;VENTOLIN HFA) 108 (90 BASE) MCG/ACT inhaler Inhale 2 puffs into the lungs every 6 (six) hours as needed for wheezing or shortness of breath. 1 Inhaler 1  . ALPRAZolam (XANAX) 0.5 MG tablet Take 1 tablet (0.5 mg total) by mouth 2 (two) times daily as needed for anxiety. 20 tablet 0  . ARIPiprazole (ABILIFY) 5 MG tablet Take 1 tablet (5 mg total) by mouth daily. 30 tablet 6  . aspirin 81 MG EC tablet Take 1 tablet (81 mg total) by mouth daily. 30 tablet   . budesonide-formoterol (SYMBICORT) 160-4.5 MCG/ACT inhaler Inhale 2 puffs into the lungs 2 (  two) times daily. 1 Inhaler 12  . BYSTOLIC 5 MG tablet TAKE 1 TABLET ONCE DAILY. 30 tablet 12  . Cholecalciferol (VITAMIN D3) 50000 UNITS CAPS Take 50,000 Units by mouth once a week. (Patient taking differently: Take 50,000 Units by mouth once a week. Take on Mondays) 12 capsule 0  . diclofenac sodium (VOLTAREN) 1 % GEL Apply 4 g topically 3 (three) times daily. TO BOTH KNEES (Patient taking differently: Apply 4 g topically daily as needed (knee pain). ) 100 g 6  . DULoxetine (CYMBALTA) 30 MG capsule TAKE 1 CAPSULE ONCE DAILY ALONG WITH 60MG  CAPSULE FOR TOTAL 90MG  DAILY. 30 capsule 11  . DULoxetine (CYMBALTA) 60 MG capsule 1 cap po qd to be combined with a 30 mg duloxetine cap daily (Patient taking differently: Take 60 mg by mouth at bedtime. to be combined with a 30 mg duloxetine cap daily) 90 capsule 1  . folic acid (FOLVITE) 1 MG tablet Take 2 tablets (2 mg total) by mouth daily. 60 tablet 6  . furosemide (LASIX) 40 MG tablet Take 0.5 tablets (20 mg total) by mouth daily. 30 tablet 5  . isosorbide mononitrate (IMDUR) 30 MG 24 hr tablet TAKE 1 TABLET ONCE DAILY. 30 tablet 6  . Misc Natural Products (OSTEO BI-FLEX ADV JOINT SHIELD) TABS Take 1 tablet by mouth daily.    . Multiple Vitamin  (MULTIVITAMIN WITH MINERALS) TABS tablet Take 1 tablet by mouth daily.    . nitroGLYCERIN (NITROSTAT) 0.4 MG SL tablet Place 0.4 mg under the tongue every 5 (five) minutes as needed for chest pain.    [name] Kitchen ondansetron (ZOFRAN) 4 MG tablet Take 1 tablet (4 mg total) by mouth every 6 (six) hours. (Patient taking differently: Take 4 mg by mouth every 6 (six) hours as needed for nausea or vomiting. ) 12 tablet 0  . oxyCODONE-acetaminophen (PERCOCET) 10-325 MG tablet Take 1 tablet by mouth every 8 (eight) hours as needed for pain. ONE MONTH SUPPLY 30 tablet 0  . OXYGEN Inhale 3 L into the lungs continuous.    . pantoprazole (PROTONIX) 40 MG tablet Take 1 tablet (40 mg total) by mouth daily. 90 tablet 1  . Polyethyl Glycol-Propyl Glycol (SYSTANE) 0.4-0.3 % SOLN Place 1 drop into both eyes daily.    . polyethylene glycol powder (GLYCOLAX/MIRALAX) powder Take 17 g by mouth daily. (Patient taking differently: Take 17 g by mouth daily as needed for mild constipation or moderate constipation. ) 3350 g 6  . potassium chloride SA (K-DUR,KLOR-CON) 20 MEQ tablet Take 2 tablets (40 mEq total) by mouth daily. 60 tablet 5  . pregabalin (LYRICA) 300 MG capsule Take 1 capsule (300 mg total) by mouth daily. Take 1 capsule twice a day (Patient taking differently: Take 300 mg by mouth 2 (two) times daily. ) 60 capsule 5  . traZODone (DESYREL) 50 MG tablet Take 3 tablets (150 mg total) by mouth at bedtime. 90 tablet 6  . levofloxacin (LEVAQUIN) 750 MG tablet Take 1 tablet (750 mg total) by mouth daily at 6 PM. (Patient not taking: Reported on 01/08/2016) 3 tablet 0   Facility-Administered Medications Prior to Visit  Medication Dose Route Frequency Provider Last Rate Last Dose  . ferumoxytol (FERAHEME) 510 mg in sodium chloride 0.9 % 100 mL IVPB  510 mg Intravenous Once Volanda Napoleon, MD        ASSESSMENT/PLAN:  Hospital f/u:  1) Fall at home: no injury.  She has been chronically debilitated/has chronic pain syndrome.  Pt is still not ready to accept any living situation other than her apartment, even though I think she needs SNF or nursing home.  2) Polypharmacy: I have often felt she is on too many psychotropic meds but she simply will not get off of them or admit that her doses need to be decreased.  She indeed seems fine in office today.  3) UTI: completed abx course.  4) Pneumonia in hosp: not clear per radiographic findings, but repeat CXR ordered as per discharging hospitalist's recommendations.  5) Hypokalemia: repleted in hosp.  Recheck BMET today.  Medical decision making of moderate complexity was utilized today.  An After Visit Summary was printed and given to the patient.  FOLLOW UP:  2 wks

## 2016-01-08 NOTE — Progress Notes (Signed)
Pre visit review using our clinic review tool, if applicable. No additional management support is needed unless otherwise documented below in the visit note. 

## 2016-01-09 DIAGNOSIS — M6281 Muscle weakness (generalized): Secondary | ICD-10-CM | POA: Diagnosis not present

## 2016-01-09 DIAGNOSIS — R296 Repeated falls: Secondary | ICD-10-CM | POA: Diagnosis not present

## 2016-01-10 ENCOUNTER — Encounter: Payer: Self-pay | Admitting: Physical Medicine & Rehabilitation

## 2016-01-10 ENCOUNTER — Encounter: Payer: Medicare Other | Attending: Physical Medicine & Rehabilitation | Admitting: Physical Medicine & Rehabilitation

## 2016-01-10 VITALS — BP 125/68 | HR 90 | Resp 14

## 2016-01-10 DIAGNOSIS — G894 Chronic pain syndrome: Secondary | ICD-10-CM

## 2016-01-10 DIAGNOSIS — M1711 Unilateral primary osteoarthritis, right knee: Secondary | ICD-10-CM

## 2016-01-10 DIAGNOSIS — M797 Fibromyalgia: Secondary | ICD-10-CM

## 2016-01-10 DIAGNOSIS — Z5181 Encounter for therapeutic drug level monitoring: Secondary | ICD-10-CM | POA: Diagnosis not present

## 2016-01-10 DIAGNOSIS — M75101 Unspecified rotator cuff tear or rupture of right shoulder, not specified as traumatic: Secondary | ICD-10-CM

## 2016-01-10 DIAGNOSIS — R2689 Other abnormalities of gait and mobility: Secondary | ICD-10-CM

## 2016-01-10 DIAGNOSIS — Z79899 Other long term (current) drug therapy: Secondary | ICD-10-CM | POA: Insufficient documentation

## 2016-01-10 MED ORDER — OXYCODONE-ACETAMINOPHEN 10-325 MG PO TABS: 1.0000 | ORAL_TABLET | Freq: Three times a day (TID) | ORAL | Status: DC | PRN

## 2016-01-10 NOTE — Patient Instructions (Signed)
  PLEASE CALL ME WITH ANY PROBLEMS OR QUESTIONS (#[phone]).      

## 2016-01-10 NOTE — Progress Notes (Signed)
Subjective:    Patient ID: Linda [name], female    DOB: 01-Aug-1946, 70 y.o.   MRN: [id number]  HPI   Linda [name] is here in follow up of her chronic pain. She had a fall about 4-5 weeks ago. She ultimately saw an orthopedic surgeon who recommended conservative care, ?splinting of right arm. Her ribs are bothering her when she moves or takes a deep breath. She has a hard time sleeping primarly because of the shoulder. Her shoulder is most severe along the superior and lateral aspsects. She has some anterior pain there also.   She continues on her percocet as prescribed. She never took the fentanyl per family request.   Pain Inventory Average Pain 6 Pain Right Now 7 My pain is constant, sharp, burning, stabbing and aching  In the last 24 hours, has pain interfered with the following? General activity 10 Relation with others 10 Enjoyment of life 10 What TIME of day is your pain at its worst? NA Sleep (in general) NA  Pain is worse with: walking and bending Pain improves with: rest, heat/ice and medication Relief from Meds: 7  Mobility how many minutes can you walk? 5-10 ability to climb steps?  no do you drive?  yes use a wheelchair needs help with transfers Do you have any goals in this area?  yes  Function not employed: date last employed 2002 I need assistance with the following:  dressing, bathing, household duties and shopping  Neuro/Psych bladder control problems weakness trouble walking depression  Prior Studies Any changes since last visit?  no x-rays CT/MRI  Physicians involved in your care Any changes since last visit?  no Primary care . Orthopedist .   Family History  Problem Relation Age of Onset  . Arthritis Mother   . Kidney disease Mother   . Heart disease Father   . Stroke Father   . Hypertension Father   . Diabetes Father    Social History   Social History  . Marital Status: Widowed    Spouse Name: N/A  . Number of Children:  2  . Years of Education: N/A   Social History Main Topics  . Smoking status: Never Smoker   . Smokeless tobacco: Never Used     Comment: never used tobacco  . Alcohol Use: No  . Drug Use: No  . Sexual Activity: Not Currently   Other Topics Concern  . Not on file   Social History Narrative   Widowed, 2 sons.  Relocated to [place] 09/2012 to be closer to her son who has MS.   Husband d 2015--mesothelioma.   Occupation: former Pharmacist, hospital.   Education: masters degree level.   No T/A/Ds.   Past Surgical History  Procedure Laterality Date  . Appendectomy  1960  . Total abdominal hysterectomy  1974  . Tendon release  1996    Right forearm and hand  . Knee surgery  2005  . Heel spur surgery Left 2008  . Plantar fascia release Left 2008  . Axillary surgery Left 1978    Multiple "lump" in armpit per pt  . Coccyx removal  1972  . Cardiac catheterization  01/2013    nonobstructive CAD, EF 55-60%  . Transthoracic echocardiogram  01/2013; 04/2014;08/2015    2014--NORMAL.  2015--focal basal septal hypertrophy, EF 55-60%, grade I diast dysfxn, mild LAE.  08/2015 EF 55-60%, nl LV syst fxn, grade I DD, valves wnl  . Dilation and curettage of uterus  ? 1970's  .  Eye surgery Left 2012-2013    "injections for ~ 1 yr; don't really know what for" (07/12/2013)  . Spirometry  04/25/14    In hosp for acute asthma/COPD flare: mixed obstructive and restrictive lung disease. The FEV1 is severely reduced at 45% predicted.  FEV1 signif decreased compared to prior spirometry 07/23/13.  [name] Kitchen Esophagogastroduodenoscopy N/A 07/19/2014    Gastritis found + in the setting of supratherapeutic INR, +plavix, + meloxicam.  . Left heart catheterization with coronary angiogram N/A 01/30/2013    Procedure: LEFT HEART CATHETERIZATION WITH CORONARY ANGIOGRAM;  Surgeon: Clent Demark, MD;  Location: [facility] CATH LAB;  Service: Cardiovascular;  Laterality: N/A;  . Cardiovascular stress test  02/22/15    Low risk myocard perf imaging; wall motion  normal, normal EF   Past Medical History  Diagnosis Date  . HTN (hypertension)   . Depression   . Recurrent UTI     hx of hospitalization for pyelonephritis; started abx prophylaxis 06/2015  . Hay fever   . Mixed incontinence urge and stress   . Diverticular disease   . Insomnia   . Fibromyalgia     Patient states dx was around her late 58s but she had sx's for years prior to this.  . Syncope     Hypotensive; ED visit--Dr. Terrence Dupont did Cath--nonobstructive CAD, EF 55-60%.  In retrospect, suspect pt rx med misuse/polypharmacy  . Idiopathic angio-edema-urticaria 72014    Angioedema component was very minimal  . Asthma     w/ asbestososis   . History of pneumonia     hospitalized 12/2011, 02/2013, and 07/2013 [facility]) for this  . Anginal pain ([place])     Nonobstructive CAD 2014; however, her cardiologist put her on a statin for this and NOT for hyperlipidemia per pt report.  . OSA on CPAP     prior to move to [place]--had another sleep study 10/2015 w/pulm Dr. Camillo Flaming.  . H/O hiatal hernia   . Migraine syndrome     "not as often anymore; used to be ~ q wk" (07/12/2013)  . Tension headache, chronic   . DDD (degenerative disc disease)     lumbar and cervical.   . Osteoarthritis     "severe; progressing fast" (07/12/2013); multiple joints-not surgical candidate for TKR (03/2015)  . Chronic lower back pain   . Anxiety     panic attacks  . Nephrolithiasis     "passed all on my own or they are still in there" (07/12/2013)  . Pyelonephritis     "several times over the last 30 yr" (07/12/2013)  . Diastolic congestive heart failure ([place])   . COPD (chronic obstructive pulmonary disease) ([place])   . Pulmonary embolism ([place]) 07/2013    Dx at [facility] with very small peripheral upper lobe pe 07/2013: pt took coumadin for about 8-9 mo  . Pleural plaque with presence of asbestos 07/22/2013  . BPPV (benign paroxysmal positional vertigo) 12/16/2012  . RBBB (right bundle branch block)   . Acute upper GI bleed 06/2014     while pt taking coumadin, plavix, and meloxicam---despite being told not to take coumadin.  . Iron deficiency anemia     Hematologist in [place], [place] did extensive w/u; no cause found; failed oral supplement;; gets fairly regular (q43m or so) IV iron infusions (Venofer -iron sucrose- 200mg  with procrit.  "for 14 yr I've been getting blood work q month & getting infusions prn" (07/12/2013).  Dr. Marin Olp locally, iron infusions done, EPO deficiency dx'd  . Pernicious anemia  08/24/2014   There were no vitals taken for this visit.  Opioid Risk Score:   Fall Risk Score:  `1  Depression screen PHQ 2/9  Depression screen [facility] 2/9 08/11/2015 07/12/2015 03/27/2015 03/31/2014  Decreased Interest 3 0 3 0  Down, Depressed, Hopeless 3 1 3  0  PHQ - 2 Score 6 1 6  0  Altered sleeping 2 - 2 -  Tired, decreased energy 3 - 3 -  Change in appetite 0 - 2 -  Feeling bad or failure about yourself  2 - 2 -  Trouble concentrating 0 - 2 -  Moving slowly or fidgety/restless 0 - 0 -  Suicidal thoughts 0 - 0 -  PHQ-9 Score 13 - 17 -    Review of Systems  Constitutional: Positive for diaphoresis.       Bladder control problems  Respiratory: Positive for shortness of breath.   Gastrointestinal: Positive for nausea.  Musculoskeletal: Positive for gait problem.  Neurological: Positive for weakness.  Psychiatric/Behavioral: Positive for dysphoric mood.  All other systems reviewed and are negative.      Objective:   Physical Exam   Constitutional: she is in a w/c. Portable oxygen connected at 2L.  HENT: oral mucosa pink, dentition good  Head: Normocephalic and atraumatic.  Eyes: Conjunctivae are normal. Pupils are equal, round, and reactive to light.  Neck: Normal range of motion. Neck supple.  Cardiovascular: Normal rate and regular rhythm. No murmurs  Respiratory: Effort normal and breath sounds normal. No respiratory distress. She has no wheezes.  GI: Soft. Bowel sounds are normal. She exhibits no  distension. There is no tenderness.  Neurological: She is alert and oriented to person, place, and time. Mild sensory loss over right foot and ulnar hand is persistent---otherwise normal sensory exam. Strength grossly 4+/5 in bilateral UE's. LE's 4/5 HF and Knees, 4/5 ankles.. Normal rom and fmc. Cognitively displays good insight and awareness.  Skin: Skin is warm and dry.  M/S: substantial pain in right knee and left. Right shoulder with rotator cuff impingement signs. Has pain with abduction past 50 degrees. There is mild anterior pain also in the shoulder along biceps tendon. Point tenderness along subacromial space.  Edema 1+ LLE trace RLE. Left calf and tibial area tender. No warmth.  Psychiatric:  Flat, more interactive.   Assessment/Plan:  1. Functional deficits secondary to Gait disorder due to multiple medical and psychosocial issues--- .  2. Low back pain/fibromyalgia/R>L Knee OA  - lidoderm patches---could be useful for rib fractures also -percocet 10/325 one q 6 prn #100 was refilled today   3. Right shoulder pain, likely rotator cuff injury after fall. Xray of chest from last week doesn't reveal a fracture. Developing early adhesive capsulitis.  After informed consent and preparation of the skin with betadine and isopropyl alcohol, I injected 6mg  (1cc) of celestone and 4cc of 1% lidocaine into right subacromial space via the lateral approach. Additionally, aspiration was performed prior to injection. The patient tolerated well, and no complications were encountered. Afterward the area was cleaned and dressed. Post- injection instructions were provided. Pt was experiencing relief before she left the office   -made a referral to PT at patient's complex ([facility]) to address strength and ROM  -reviewed xray of the chest which did not reveal any shoulder fractures  -ortho to follow up this week.  4. Depression with anxiety/Grief reaction/Mood: Trazodone/cymbalta .  -this continues to be a  big problem-- .  5. [name] with asthma: Oxygen dependent. Albuterol prn.  Thirty minutes of face to face patient care time were spent during this visit. All questions were encouraged and answered. Follow up in about a month.

## 2016-01-11 DIAGNOSIS — M6281 Muscle weakness (generalized): Secondary | ICD-10-CM | POA: Diagnosis not present

## 2016-01-11 DIAGNOSIS — R296 Repeated falls: Secondary | ICD-10-CM | POA: Diagnosis not present

## 2016-01-12 DIAGNOSIS — M797 Fibromyalgia: Secondary | ICD-10-CM | POA: Diagnosis not present

## 2016-01-12 DIAGNOSIS — I2510 Atherosclerotic heart disease of native coronary artery without angina pectoris: Secondary | ICD-10-CM | POA: Diagnosis not present

## 2016-01-12 DIAGNOSIS — M159 Polyosteoarthritis, unspecified: Secondary | ICD-10-CM | POA: Diagnosis not present

## 2016-01-12 DIAGNOSIS — L989 Disorder of the skin and subcutaneous tissue, unspecified: Secondary | ICD-10-CM | POA: Diagnosis not present

## 2016-01-12 DIAGNOSIS — I110 Hypertensive heart disease with heart failure: Secondary | ICD-10-CM | POA: Diagnosis not present

## 2016-01-12 DIAGNOSIS — I5032 Chronic diastolic (congestive) heart failure: Secondary | ICD-10-CM | POA: Diagnosis not present

## 2016-01-15 DIAGNOSIS — I110 Hypertensive heart disease with heart failure: Secondary | ICD-10-CM | POA: Diagnosis not present

## 2016-01-15 DIAGNOSIS — I2510 Atherosclerotic heart disease of native coronary artery without angina pectoris: Secondary | ICD-10-CM | POA: Diagnosis not present

## 2016-01-15 DIAGNOSIS — I5032 Chronic diastolic (congestive) heart failure: Secondary | ICD-10-CM | POA: Diagnosis not present

## 2016-01-15 DIAGNOSIS — M159 Polyosteoarthritis, unspecified: Secondary | ICD-10-CM | POA: Diagnosis not present

## 2016-01-15 DIAGNOSIS — M797 Fibromyalgia: Secondary | ICD-10-CM | POA: Diagnosis not present

## 2016-01-15 DIAGNOSIS — L989 Disorder of the skin and subcutaneous tissue, unspecified: Secondary | ICD-10-CM | POA: Diagnosis not present

## 2016-01-16 ENCOUNTER — Telehealth: Payer: Self-pay | Admitting: *Deleted

## 2016-01-16 DIAGNOSIS — M159 Polyosteoarthritis, unspecified: Secondary | ICD-10-CM | POA: Diagnosis not present

## 2016-01-16 DIAGNOSIS — I110 Hypertensive heart disease with heart failure: Secondary | ICD-10-CM | POA: Diagnosis not present

## 2016-01-16 DIAGNOSIS — L989 Disorder of the skin and subcutaneous tissue, unspecified: Secondary | ICD-10-CM | POA: Diagnosis not present

## 2016-01-16 DIAGNOSIS — M797 Fibromyalgia: Secondary | ICD-10-CM | POA: Diagnosis not present

## 2016-01-16 DIAGNOSIS — I5032 Chronic diastolic (congestive) heart failure: Secondary | ICD-10-CM | POA: Diagnosis not present

## 2016-01-16 DIAGNOSIS — I2510 Atherosclerotic heart disease of native coronary artery without angina pectoris: Secondary | ICD-10-CM | POA: Diagnosis not present

## 2016-01-16 NOTE — Telephone Encounter (Signed)
I see rotator cuff strain but no dx of rotator cuff tear. I don't see any history of wrist fracture.

## 2016-01-16 NOTE — Telephone Encounter (Signed)
Sonia Baller, RN with [facility] on 01/16/16 at 12:36pm stating that pt had a recent fall and she needed to confirm some diagnosis. She stated that she needed to confirm Dx of rotator cuff tear and wrist fracture. I seen the Dx for rotator cuff tear but did not see one for wrist fracture. Please advise. Thanks.

## 2016-01-17 ENCOUNTER — Telehealth: Payer: Self-pay | Admitting: *Deleted

## 2016-01-17 DIAGNOSIS — L989 Disorder of the skin and subcutaneous tissue, unspecified: Secondary | ICD-10-CM | POA: Diagnosis not present

## 2016-01-17 DIAGNOSIS — M159 Polyosteoarthritis, unspecified: Secondary | ICD-10-CM | POA: Diagnosis not present

## 2016-01-17 DIAGNOSIS — I5032 Chronic diastolic (congestive) heart failure: Secondary | ICD-10-CM | POA: Diagnosis not present

## 2016-01-17 DIAGNOSIS — I2510 Atherosclerotic heart disease of native coronary artery without angina pectoris: Secondary | ICD-10-CM | POA: Diagnosis not present

## 2016-01-17 DIAGNOSIS — I110 Hypertensive heart disease with heart failure: Secondary | ICD-10-CM | POA: Diagnosis not present

## 2016-01-17 DIAGNOSIS — M797 Fibromyalgia: Secondary | ICD-10-CM | POA: Diagnosis not present

## 2016-01-17 NOTE — Telephone Encounter (Signed)
Spoke to [place], she was advised of message below. I miss understood her voice mail she needed to see if there was any dx of rib fracture and she also needed a dx for the "burns on pts knee". She stated that pt told her they "burns" were from a tens units that her therapist used. Please advise. Thanks.

## 2016-01-17 NOTE — Telephone Encounter (Signed)
Mallory, OT, Arville Go called for verbal orders for right shoulder range of motion, 1week1 followed by 2week5. Verbal order was given

## 2016-01-17 NOTE — Telephone Encounter (Signed)
No diagnosis of rib fracture. Yes, she told me the burns on her knee were sustained from the PT placing the TENS unit there.

## 2016-01-17 NOTE — Telephone Encounter (Signed)
Sonia Baller advised and voiced understanding.

## 2016-01-18 DIAGNOSIS — I5032 Chronic diastolic (congestive) heart failure: Secondary | ICD-10-CM | POA: Diagnosis not present

## 2016-01-18 DIAGNOSIS — M159 Polyosteoarthritis, unspecified: Secondary | ICD-10-CM | POA: Diagnosis not present

## 2016-01-18 DIAGNOSIS — I2510 Atherosclerotic heart disease of native coronary artery without angina pectoris: Secondary | ICD-10-CM | POA: Diagnosis not present

## 2016-01-18 DIAGNOSIS — I110 Hypertensive heart disease with heart failure: Secondary | ICD-10-CM | POA: Diagnosis not present

## 2016-01-18 DIAGNOSIS — M797 Fibromyalgia: Secondary | ICD-10-CM | POA: Diagnosis not present

## 2016-01-18 DIAGNOSIS — L989 Disorder of the skin and subcutaneous tissue, unspecified: Secondary | ICD-10-CM | POA: Diagnosis not present

## 2016-01-22 DIAGNOSIS — M25511 Pain in right shoulder: Secondary | ICD-10-CM | POA: Diagnosis not present

## 2016-01-22 DIAGNOSIS — S2231XD Fracture of one rib, right side, subsequent encounter for fracture with routine healing: Secondary | ICD-10-CM | POA: Diagnosis not present

## 2016-01-22 DIAGNOSIS — R0789 Other chest pain: Secondary | ICD-10-CM | POA: Diagnosis not present

## 2016-01-23 DIAGNOSIS — I5032 Chronic diastolic (congestive) heart failure: Secondary | ICD-10-CM | POA: Diagnosis not present

## 2016-01-23 DIAGNOSIS — I2510 Atherosclerotic heart disease of native coronary artery without angina pectoris: Secondary | ICD-10-CM | POA: Diagnosis not present

## 2016-01-23 DIAGNOSIS — L989 Disorder of the skin and subcutaneous tissue, unspecified: Secondary | ICD-10-CM | POA: Diagnosis not present

## 2016-01-23 DIAGNOSIS — I110 Hypertensive heart disease with heart failure: Secondary | ICD-10-CM | POA: Diagnosis not present

## 2016-01-23 DIAGNOSIS — M159 Polyosteoarthritis, unspecified: Secondary | ICD-10-CM | POA: Diagnosis not present

## 2016-01-23 DIAGNOSIS — M797 Fibromyalgia: Secondary | ICD-10-CM | POA: Diagnosis not present

## 2016-01-24 DIAGNOSIS — I2510 Atherosclerotic heart disease of native coronary artery without angina pectoris: Secondary | ICD-10-CM | POA: Diagnosis not present

## 2016-01-24 DIAGNOSIS — M159 Polyosteoarthritis, unspecified: Secondary | ICD-10-CM | POA: Diagnosis not present

## 2016-01-24 DIAGNOSIS — I5032 Chronic diastolic (congestive) heart failure: Secondary | ICD-10-CM | POA: Diagnosis not present

## 2016-01-24 DIAGNOSIS — I110 Hypertensive heart disease with heart failure: Secondary | ICD-10-CM | POA: Diagnosis not present

## 2016-01-24 DIAGNOSIS — M797 Fibromyalgia: Secondary | ICD-10-CM | POA: Diagnosis not present

## 2016-01-24 DIAGNOSIS — L989 Disorder of the skin and subcutaneous tissue, unspecified: Secondary | ICD-10-CM | POA: Diagnosis not present

## 2016-01-25 DIAGNOSIS — M1712 Unilateral primary osteoarthritis, left knee: Secondary | ICD-10-CM | POA: Diagnosis not present

## 2016-01-25 DIAGNOSIS — M170 Bilateral primary osteoarthritis of knee: Secondary | ICD-10-CM | POA: Diagnosis not present

## 2016-01-25 DIAGNOSIS — M1711 Unilateral primary osteoarthritis, right knee: Secondary | ICD-10-CM | POA: Diagnosis not present

## 2016-01-26 ENCOUNTER — Ambulatory Visit: Payer: Medicare Other | Admitting: Family Medicine

## 2016-01-26 DIAGNOSIS — L989 Disorder of the skin and subcutaneous tissue, unspecified: Secondary | ICD-10-CM | POA: Diagnosis not present

## 2016-01-26 DIAGNOSIS — I110 Hypertensive heart disease with heart failure: Secondary | ICD-10-CM | POA: Diagnosis not present

## 2016-01-26 DIAGNOSIS — M159 Polyosteoarthritis, unspecified: Secondary | ICD-10-CM | POA: Diagnosis not present

## 2016-01-26 DIAGNOSIS — M797 Fibromyalgia: Secondary | ICD-10-CM | POA: Diagnosis not present

## 2016-01-26 DIAGNOSIS — I2510 Atherosclerotic heart disease of native coronary artery without angina pectoris: Secondary | ICD-10-CM | POA: Diagnosis not present

## 2016-01-26 DIAGNOSIS — I5032 Chronic diastolic (congestive) heart failure: Secondary | ICD-10-CM | POA: Diagnosis not present

## 2016-01-30 DIAGNOSIS — I5032 Chronic diastolic (congestive) heart failure: Secondary | ICD-10-CM | POA: Diagnosis not present

## 2016-01-30 DIAGNOSIS — I2510 Atherosclerotic heart disease of native coronary artery without angina pectoris: Secondary | ICD-10-CM | POA: Diagnosis not present

## 2016-01-30 DIAGNOSIS — L989 Disorder of the skin and subcutaneous tissue, unspecified: Secondary | ICD-10-CM | POA: Diagnosis not present

## 2016-01-30 DIAGNOSIS — M159 Polyosteoarthritis, unspecified: Secondary | ICD-10-CM | POA: Diagnosis not present

## 2016-01-30 DIAGNOSIS — M797 Fibromyalgia: Secondary | ICD-10-CM | POA: Diagnosis not present

## 2016-01-30 DIAGNOSIS — I110 Hypertensive heart disease with heart failure: Secondary | ICD-10-CM | POA: Diagnosis not present

## 2016-01-31 DIAGNOSIS — I110 Hypertensive heart disease with heart failure: Secondary | ICD-10-CM | POA: Diagnosis not present

## 2016-01-31 DIAGNOSIS — L989 Disorder of the skin and subcutaneous tissue, unspecified: Secondary | ICD-10-CM | POA: Diagnosis not present

## 2016-01-31 DIAGNOSIS — I2510 Atherosclerotic heart disease of native coronary artery without angina pectoris: Secondary | ICD-10-CM | POA: Diagnosis not present

## 2016-01-31 DIAGNOSIS — I5032 Chronic diastolic (congestive) heart failure: Secondary | ICD-10-CM | POA: Diagnosis not present

## 2016-01-31 DIAGNOSIS — M159 Polyosteoarthritis, unspecified: Secondary | ICD-10-CM | POA: Diagnosis not present

## 2016-01-31 DIAGNOSIS — M797 Fibromyalgia: Secondary | ICD-10-CM | POA: Diagnosis not present

## 2016-02-01 ENCOUNTER — Ambulatory Visit (HOSPITAL_BASED_OUTPATIENT_CLINIC_OR_DEPARTMENT_OTHER)
Admission: RE | Admit: 2016-02-01 | Discharge: 2016-02-01 | Disposition: A | Discharge status: Home / Self Care | Payer: Medicare Other | Source: Ambulatory Visit | Attending: Family Medicine | Admitting: Family Medicine

## 2016-02-01 DIAGNOSIS — J189 Pneumonia, unspecified organism: Secondary | ICD-10-CM | POA: Diagnosis not present

## 2016-02-01 DIAGNOSIS — J929 Pleural plaque without asbestos: Secondary | ICD-10-CM | POA: Diagnosis not present

## 2016-02-01 DIAGNOSIS — J449 Chronic obstructive pulmonary disease, unspecified: Secondary | ICD-10-CM | POA: Diagnosis not present

## 2016-02-01 DIAGNOSIS — J9811 Atelectasis: Secondary | ICD-10-CM | POA: Diagnosis not present

## 2016-02-02 ENCOUNTER — Encounter: Payer: Self-pay | Admitting: Family Medicine

## 2016-02-02 ENCOUNTER — Ambulatory Visit (INDEPENDENT_AMBULATORY_CARE_PROVIDER_SITE_OTHER): Payer: Medicare Other | Admitting: Family Medicine

## 2016-02-02 VITALS — BP 148/90 | HR 60 | Temp 97.5°F | Resp 16 | Ht 64.0 in | Wt 174.8 lb

## 2016-02-02 DIAGNOSIS — R5381 Other malaise: Secondary | ICD-10-CM

## 2016-02-02 DIAGNOSIS — J180 Bronchopneumonia, unspecified organism: Secondary | ICD-10-CM

## 2016-02-02 DIAGNOSIS — F331 Major depressive disorder, recurrent, moderate: Secondary | ICD-10-CM

## 2016-02-02 DIAGNOSIS — G894 Chronic pain syndrome: Secondary | ICD-10-CM

## 2016-02-02 DIAGNOSIS — J4540 Moderate persistent asthma, uncomplicated: Secondary | ICD-10-CM | POA: Diagnosis not present

## 2016-02-02 DIAGNOSIS — Z79899 Other long term (current) drug therapy: Secondary | ICD-10-CM | POA: Diagnosis not present

## 2016-02-02 MED ORDER — ARIPIPRAZOLE 10 MG PO TABS
10.0000 mg | ORAL_TABLET | Freq: Every day | ORAL | Status: DC
Start: 2016-02-02 — End: 2016-07-11

## 2016-02-02 MED ORDER — TRAZODONE HCL 50 MG PO TABS: ORAL_TABLET | ORAL | Status: DC

## 2016-02-02 NOTE — Progress Notes (Signed)
OFFICE VISIT  02/03/2016   CC:  Chief Complaint  Patient presents with  . Follow-up    also had cxr yesterday   HPI:    Patient is a 70 y.o. Caucasian female who presents for 3 week f/u polypharmacy, recent hx of fall requiring hospitalization, recent bronchopneumonia and UTI for which she has been completely treated.  Had a f/u CXR yesterday and it showed stable bilat calcified pleural plaques but no active cardiopulm dz.  She got bilat knee steroid injections 1 wk ago by Dr. Ricki Rodriguez and felt signif improvement, has been able to walk some short distances lately w/out her walker or cane. Also saw Dr. Delilah Shan in same ortho group, and he's seeing her for R RC prob and hx of R sided rib fx (PT).  Resp: feels a little rattle in upper sternal area much of the time when she breaths, "like I need to cough but nothing comes up".  Has felt this way about the last month or so.  No SOB at rest or with the gentle ambulation she is able to do.  No fevers, no chest pain, no wheezing.  She says she feels like she has no problem functioning on the meds she is on.  She did drop her morning lyrica so now is only taking this med at night.  Says she wakes up every morning with a clear head and no fogginess or suggestion of impairment from her meds.  No feeling of impairment/oversedation throughout her day or evening either.  Psych: still has days of deep/overwhelming sadness, loneliness.  Says about 50% of her days are like this. Compliant with cymbalta 90 mg qd and ability 5 mg qd.  Also, she takes trazodone 2-4 tabs (50 mg tabs) qhs to help with sleep and needs her rx changed to reflect this dosing (current rx--from hospitalist during last admission--instructions say 1-3 tabs qhs.   Past Medical History  Diagnosis Date  . HTN (hypertension)   . Depression   . Recurrent UTI     hx of hospitalization for pyelonephritis; started abx prophylaxis 06/2015  . Hay fever   . Mixed incontinence urge and stress    . Diverticular disease   . Insomnia   . Fibromyalgia     Patient states dx was around her late 41s but she had sx's for years prior to this.  . Syncope     Hypotensive; ED visit--Dr. Terrence Dupont did Cath--nonobstructive CAD, EF 55-60%.  In retrospect, suspect pt rx med misuse/polypharmacy  . Idiopathic angio-edema-urticaria 72014    Angioedema component was very minimal  . Asthma     w/ asbestososis   . History of pneumonia     hospitalized 12/2011, 02/2013, and 07/2013 [facility]) for this  . Anginal pain ([place])     Nonobstructive CAD 2014; however, her cardiologist put her on a statin for this and NOT for hyperlipidemia per pt report.  . OSA on CPAP     prior to move to [place]--had another sleep study 10/2015 w/pulm Dr. Camillo Flaming.  . H/O hiatal hernia   . Migraine syndrome     "not as often anymore; used to be ~ q wk" (07/12/2013)  . Tension headache, chronic   . DDD (degenerative disc disease)     lumbar and cervical.   . Osteoarthritis     "severe; progressing fast" (07/12/2013); multiple joints-not surgical candidate for TKR (03/2015)  . Chronic lower back pain   . Anxiety     panic attacks  .  Nephrolithiasis     "passed all on my own or they are still in there" (07/12/2013)  . Pyelonephritis     "several times over the last 30 yr" (07/12/2013)  . Diastolic congestive heart failure ([place])   . COPD (chronic obstructive pulmonary disease) ([place])   . Pulmonary embolism ([place]) 07/2013    Dx at [facility] with very small peripheral upper lobe pe 07/2013: pt took coumadin for about 8-9 mo  . Pleural plaque with presence of asbestos 07/22/2013  . BPPV (benign paroxysmal positional vertigo) 12/16/2012  . RBBB (right bundle branch block)   . Acute upper GI bleed 06/2014    while pt taking coumadin, plavix, and meloxicam---despite being told not to take coumadin.  . Iron deficiency anemia     Hematologist in [place], [place] did extensive w/u; no cause found; failed oral supplement;; gets fairly regular (q76m or so) IV  iron infusions (Venofer -iron sucrose- 200mg  with procrit.  "for 14 yr I've been getting blood work q month & getting infusions prn" (07/12/2013).  Dr. Marin Olp locally, iron infusions done, EPO deficiency dx'd  . Pernicious anemia 08/24/2014    Past Surgical History  Procedure Laterality Date  . Appendectomy  1960  . Total abdominal hysterectomy  1974  . Tendon release  1996    Right forearm and hand  . Knee surgery  2005  . Heel spur surgery Left 2008  . Plantar fascia release Left 2008  . Axillary surgery Left 1978    Multiple "lump" in armpit per pt  . Coccyx removal  1972  . Cardiac catheterization  01/2013    nonobstructive CAD, EF 55-60%  . Transthoracic echocardiogram  01/2013; 04/2014;08/2015    2014--NORMAL.  2015--focal basal septal hypertrophy, EF 55-60%, grade I diast dysfxn, mild LAE.  08/2015 EF 55-60%, nl LV syst fxn, grade I DD, valves wnl  . Dilation and curettage of uterus  ? 1970's  . Eye surgery Left 2012-2013    "injections for ~ 1 yr; don't really know what for" (07/12/2013)  . Spirometry  04/25/14    In hosp for acute asthma/COPD flare: mixed obstructive and restrictive lung disease. The FEV1 is severely reduced at 45% predicted.  FEV1 signif decreased compared to prior spirometry 07/23/13.  [name] Kitchen Esophagogastroduodenoscopy N/A 07/19/2014    Gastritis found + in the setting of supratherapeutic INR, +plavix, + meloxicam.  . Left heart catheterization with coronary angiogram N/A 01/30/2013    Procedure: LEFT HEART CATHETERIZATION WITH CORONARY ANGIOGRAM;  Surgeon: Clent Demark, MD;  Location: [facility] CATH LAB;  Service: Cardiovascular;  Laterality: N/A;  . Cardiovascular stress test  02/22/15    Low risk myocard perf imaging; wall motion normal, normal EF    Outpatient Prescriptions Prior to Visit  Medication Sig Dispense Refill  . albuterol (PROVENTIL HFA;VENTOLIN HFA) 108 (90 BASE) MCG/ACT inhaler Inhale 2 puffs into the lungs every 6 (six) hours as needed for wheezing or shortness  of breath. 1 Inhaler 1  . ALPRAZolam (XANAX) 0.5 MG tablet Take 1 tablet (0.5 mg total) by mouth 2 (two) times daily as needed for anxiety. 20 tablet 0  . aspirin 81 MG EC tablet Take 1 tablet (81 mg total) by mouth daily. 30 tablet   . budesonide-formoterol (SYMBICORT) 160-4.5 MCG/ACT inhaler Inhale 2 puffs into the lungs 2 (two) times daily. 1 Inhaler 12  . BYSTOLIC 5 MG tablet TAKE 1 TABLET ONCE DAILY. 30 tablet 12  . Cholecalciferol (VITAMIN D3) 50000 UNITS CAPS Take 50,000 Units by  mouth once a week. (Patient taking differently: Take 50,000 Units by mouth once a week. Take on Mondays) 12 capsule 0  . diclofenac sodium (VOLTAREN) 1 % GEL Apply 4 g topically 3 (three) times daily. TO BOTH KNEES (Patient taking differently: Apply 4 g topically daily as needed (knee pain). ) 100 g 6  . DULoxetine (CYMBALTA) 30 MG capsule TAKE 1 CAPSULE ONCE DAILY ALONG WITH 60MG  CAPSULE FOR TOTAL 90MG  DAILY. 30 capsule 11  . DULoxetine (CYMBALTA) 60 MG capsule 1 cap po qd to be combined with a 30 mg duloxetine cap daily (Patient taking differently: Take 60 mg by mouth at bedtime. to be combined with a 30 mg duloxetine cap daily) 90 capsule 1  . folic acid (FOLVITE) 1 MG tablet Take 2 tablets (2 mg total) by mouth daily. 60 tablet 6  . furosemide (LASIX) 40 MG tablet Take 0.5 tablets (20 mg total) by mouth daily. 30 tablet 5  . isosorbide mononitrate (IMDUR) 30 MG 24 hr tablet TAKE 1 TABLET ONCE DAILY. 30 tablet 6  . Misc Natural Products (OSTEO BI-FLEX ADV JOINT SHIELD) TABS Take 1 tablet by mouth daily.    . Multiple Vitamin (MULTIVITAMIN WITH MINERALS) TABS tablet Take 1 tablet by mouth daily.    . nitroGLYCERIN (NITROSTAT) 0.4 MG SL tablet Place 0.4 mg under the tongue every 5 (five) minutes as needed for chest pain.    [name] Kitchen ondansetron (ZOFRAN) 4 MG tablet Take 1 tablet (4 mg total) by mouth every 6 (six) hours. (Patient taking differently: Take 4 mg by mouth every 6 (six) hours as needed for nausea or vomiting. )  12 tablet 0  . oxyCODONE-acetaminophen (PERCOCET) 10-325 MG tablet Take 1 tablet by mouth every 8 (eight) hours as needed for pain. ONE MONTH SUPPLY 100 tablet 0  . OXYGEN Inhale 3 L into the lungs continuous.    . pantoprazole (PROTONIX) 40 MG tablet Take 1 tablet (40 mg total) by mouth daily. 90 tablet 1  . Polyethyl Glycol-Propyl Glycol (SYSTANE) 0.4-0.3 % SOLN Place 1 drop into both eyes daily.    . polyethylene glycol powder (GLYCOLAX/MIRALAX) powder Take 17 g by mouth daily. (Patient taking differently: Take 17 g by mouth daily as needed for mild constipation or moderate constipation. ) 3350 g 6  . potassium chloride SA (K-DUR,KLOR-CON) 20 MEQ tablet Take 2 tablets (40 mEq total) by mouth daily. 60 tablet 5  . pregabalin (LYRICA) 300 MG capsule Take 1 capsule (300 mg total) by mouth daily. Take 1 capsule twice a day (Patient taking differently: Take 300 mg by mouth 2 (two) times daily. ) 60 capsule 5  . silver sulfADIAZINE (SILVADENE) 1 % cream Apply 1 application topically daily.    . ARIPiprazole (ABILIFY) 5 MG tablet Take 1 tablet (5 mg total) by mouth daily. 30 tablet 6  . traZODone (DESYREL) 50 MG tablet Take 3 tablets (150 mg total) by mouth at bedtime. 90 tablet 6   Facility-Administered Medications Prior to Visit  Medication Dose Route Frequency Provider Last Rate Last Dose  . ferumoxytol (FERAHEME) 510 mg in sodium chloride 0.9 % 100 mL IVPB  510 mg Intravenous Once Volanda Napoleon, MD        Allergies  Allergen Reactions  . Penicillins Itching, Swelling and Rash    Tolerated Cefepime in ED. Has patient had a PCN reaction causing immediate rash, facial/tongue/throat swelling, SOB or lightheadedness with hypotension: Yes Has patient had a PCN reaction causing severe rash involving mucus membranes or  skin necrosis: No Has patient had a PCN reaction that required hospitalization: No  Has patient had a PCN reaction occurring within the last 10 years: No     ROS As per  HPI  PE: Blood pressure 148/90, pulse 60, temperature 97.5 F (36.4 C), temperature source Oral, resp. rate 16, height 5\' 4"  (1.626 m), weight 174 lb 12 oz (79.266 kg), SpO2 92 %. Room air!  Ambulating w/out any assistive device! Gen: Alert, well appearing.  Patient is oriented to person, place, time, and situation. AFFECT: pleasant, lucid thought and speech. Gets tearful when describing her depression. CV: RRR, no m/r/g.   LUNGS: CTA bilat, nonlabored resps, good aeration in all lung fields.   LABS:    Chemistry      Component Value Date/Time   NA 137 01/08/2016 1353   NA 141 06/05/2015 1315   K 4.4 01/08/2016 1353   K 3.9 06/05/2015 1315   CL 102 01/08/2016 1353   CL 103 06/05/2015 1315   CO2 25 01/08/2016 1353   CO2 28 06/05/2015 1315   BUN 13 01/08/2016 1353   BUN 14 06/05/2015 1315   CREATININE 0.80 01/08/2016 1353   CREATININE 0.91 10/27/2015 1602      Component Value Date/Time   CALCIUM 9.0 01/08/2016 1353   CALCIUM 9.0 06/05/2015 1315   ALKPHOS 64 01/03/2016 0515   ALKPHOS 84 06/05/2015 1315   AST 24 01/03/2016 0515   AST 23 06/05/2015 1315   ALT 12* 01/03/2016 0515   ALT 17 06/05/2015 1315   BILITOT 0.6 01/03/2016 0515   BILITOT 1.00 06/05/2015 1315     Lab Results  Component Value Date   WBC 6.6 01/08/2016   HGB 13.1 01/08/2016   HCT 39.9 01/08/2016   MCV 88.2 01/08/2016   PLT 215.0 01/08/2016   Lab Results  Component Value Date   TSH 0.539 01/02/2016    IMPRESSION AND PLAN:  1) Polypharmacy; no sign of problem with drug interaction, oversedation, or neuromuscular impairment from the many meds she is on.  She says she feels like she needs each med she is on and is pleased/seems to be getting the desired effect w/out excessive side effect.  2) Insomnia: stable.  Will send in new trazodone 50mg  rx with instructions of "2-4 tabs qhs" to reflect the way she has always taken this chronicaly without problem.  3) Asthma with asbestosis: stable, continue  current meds.  She has oxygen to use prn but oxygen levels on RA today in the office are normal when checked at rest.  Her recent bronchopneumonia has completely resolved.  4) Depression/prolonged grief rxn: she is undergoing grief counseling again through her ALF, attends church services there as well.  We decided to increase her abilify from 5mg  to 10mg  qd today.  Continue cymbalta 90 mg qd.  5) Chronic pain syndrome with functional deficits (functional deficits secondary to gait disorder due to medical and psychosocial issues): LBP/fibromyalgia/R>L knee OA--this is being well addressed on an ongoing basis by her pain MD, Dr. Naaman Plummer, as well as by her orthopedic MD's, Dr. Ricki Rodriguez and Dr. Delilah Shan.  An After Visit Summary was printed and given to the patient.  FOLLOW UP: Return in about 2 months (around 04/01/2016).

## 2016-02-02 NOTE — Progress Notes (Signed)
Pre visit review using our clinic review tool, if applicable. No additional management support is needed unless otherwise documented below in the visit note. 

## 2016-02-06 DIAGNOSIS — I110 Hypertensive heart disease with heart failure: Secondary | ICD-10-CM | POA: Diagnosis not present

## 2016-02-06 DIAGNOSIS — I2510 Atherosclerotic heart disease of native coronary artery without angina pectoris: Secondary | ICD-10-CM | POA: Diagnosis not present

## 2016-02-06 DIAGNOSIS — L989 Disorder of the skin and subcutaneous tissue, unspecified: Secondary | ICD-10-CM | POA: Diagnosis not present

## 2016-02-06 DIAGNOSIS — M25561 Pain in right knee: Secondary | ICD-10-CM | POA: Diagnosis not present

## 2016-02-06 DIAGNOSIS — M170 Bilateral primary osteoarthritis of knee: Secondary | ICD-10-CM | POA: Diagnosis not present

## 2016-02-06 DIAGNOSIS — I5032 Chronic diastolic (congestive) heart failure: Secondary | ICD-10-CM | POA: Diagnosis not present

## 2016-02-06 DIAGNOSIS — M159 Polyosteoarthritis, unspecified: Secondary | ICD-10-CM | POA: Diagnosis not present

## 2016-02-06 DIAGNOSIS — M797 Fibromyalgia: Secondary | ICD-10-CM | POA: Diagnosis not present

## 2016-02-06 DIAGNOSIS — M25562 Pain in left knee: Secondary | ICD-10-CM | POA: Diagnosis not present

## 2016-02-07 DIAGNOSIS — J920 Pleural plaque with presence of asbestos: Secondary | ICD-10-CM | POA: Diagnosis not present

## 2016-02-07 DIAGNOSIS — I5032 Chronic diastolic (congestive) heart failure: Secondary | ICD-10-CM | POA: Diagnosis not present

## 2016-02-07 DIAGNOSIS — G4733 Obstructive sleep apnea (adult) (pediatric): Secondary | ICD-10-CM | POA: Diagnosis not present

## 2016-02-07 DIAGNOSIS — J9611 Chronic respiratory failure with hypoxia: Secondary | ICD-10-CM | POA: Diagnosis not present

## 2016-02-07 DIAGNOSIS — J984 Other disorders of lung: Secondary | ICD-10-CM | POA: Insufficient documentation

## 2016-02-08 ENCOUNTER — Other Ambulatory Visit: Payer: Self-pay | Admitting: Hematology & Oncology

## 2016-02-08 DIAGNOSIS — M159 Polyosteoarthritis, unspecified: Secondary | ICD-10-CM | POA: Diagnosis not present

## 2016-02-08 DIAGNOSIS — M797 Fibromyalgia: Secondary | ICD-10-CM | POA: Diagnosis not present

## 2016-02-08 DIAGNOSIS — L989 Disorder of the skin and subcutaneous tissue, unspecified: Secondary | ICD-10-CM | POA: Diagnosis not present

## 2016-02-08 DIAGNOSIS — I2510 Atherosclerotic heart disease of native coronary artery without angina pectoris: Secondary | ICD-10-CM | POA: Diagnosis not present

## 2016-02-08 DIAGNOSIS — I110 Hypertensive heart disease with heart failure: Secondary | ICD-10-CM | POA: Diagnosis not present

## 2016-02-08 DIAGNOSIS — I5032 Chronic diastolic (congestive) heart failure: Secondary | ICD-10-CM | POA: Diagnosis not present

## 2016-02-09 ENCOUNTER — Encounter: Payer: Medicare Other | Admitting: Registered Nurse

## 2016-02-09 DIAGNOSIS — I2510 Atherosclerotic heart disease of native coronary artery without angina pectoris: Secondary | ICD-10-CM | POA: Diagnosis not present

## 2016-02-09 DIAGNOSIS — I5032 Chronic diastolic (congestive) heart failure: Secondary | ICD-10-CM | POA: Diagnosis not present

## 2016-02-09 DIAGNOSIS — M797 Fibromyalgia: Secondary | ICD-10-CM | POA: Diagnosis not present

## 2016-02-09 DIAGNOSIS — L989 Disorder of the skin and subcutaneous tissue, unspecified: Secondary | ICD-10-CM | POA: Diagnosis not present

## 2016-02-09 DIAGNOSIS — I110 Hypertensive heart disease with heart failure: Secondary | ICD-10-CM | POA: Diagnosis not present

## 2016-02-09 DIAGNOSIS — M159 Polyosteoarthritis, unspecified: Secondary | ICD-10-CM | POA: Diagnosis not present

## 2016-02-12 DIAGNOSIS — M1711 Unilateral primary osteoarthritis, right knee: Secondary | ICD-10-CM | POA: Diagnosis not present

## 2016-02-12 DIAGNOSIS — M25561 Pain in right knee: Secondary | ICD-10-CM | POA: Diagnosis not present

## 2016-02-13 DIAGNOSIS — M6281 Muscle weakness (generalized): Secondary | ICD-10-CM | POA: Diagnosis not present

## 2016-02-13 DIAGNOSIS — I2510 Atherosclerotic heart disease of native coronary artery without angina pectoris: Secondary | ICD-10-CM

## 2016-02-13 DIAGNOSIS — I119 Hypertensive heart disease without heart failure: Secondary | ICD-10-CM | POA: Diagnosis not present

## 2016-02-13 DIAGNOSIS — M542 Cervicalgia: Secondary | ICD-10-CM | POA: Diagnosis not present

## 2016-02-13 DIAGNOSIS — M797 Fibromyalgia: Secondary | ICD-10-CM

## 2016-02-13 DIAGNOSIS — M25511 Pain in right shoulder: Secondary | ICD-10-CM | POA: Diagnosis not present

## 2016-02-13 DIAGNOSIS — I5032 Chronic diastolic (congestive) heart failure: Secondary | ICD-10-CM | POA: Diagnosis not present

## 2016-02-13 DIAGNOSIS — L989 Disorder of the skin and subcutaneous tissue, unspecified: Secondary | ICD-10-CM | POA: Diagnosis not present

## 2016-02-13 DIAGNOSIS — M159 Polyosteoarthritis, unspecified: Secondary | ICD-10-CM | POA: Diagnosis not present

## 2016-02-14 DIAGNOSIS — M25511 Pain in right shoulder: Secondary | ICD-10-CM | POA: Diagnosis not present

## 2016-02-14 DIAGNOSIS — M6281 Muscle weakness (generalized): Secondary | ICD-10-CM | POA: Diagnosis not present

## 2016-02-14 DIAGNOSIS — M542 Cervicalgia: Secondary | ICD-10-CM | POA: Diagnosis not present

## 2016-02-15 DIAGNOSIS — M25562 Pain in left knee: Secondary | ICD-10-CM | POA: Diagnosis not present

## 2016-02-15 DIAGNOSIS — M542 Cervicalgia: Secondary | ICD-10-CM | POA: Diagnosis not present

## 2016-02-15 DIAGNOSIS — M6281 Muscle weakness (generalized): Secondary | ICD-10-CM | POA: Diagnosis not present

## 2016-02-15 DIAGNOSIS — M1712 Unilateral primary osteoarthritis, left knee: Secondary | ICD-10-CM | POA: Diagnosis not present

## 2016-02-15 DIAGNOSIS — M25511 Pain in right shoulder: Secondary | ICD-10-CM | POA: Diagnosis not present

## 2016-02-16 ENCOUNTER — Ambulatory Visit: Payer: Medicare Other | Admitting: Cardiology

## 2016-02-16 DIAGNOSIS — M542 Cervicalgia: Secondary | ICD-10-CM | POA: Diagnosis not present

## 2016-02-16 DIAGNOSIS — M6281 Muscle weakness (generalized): Secondary | ICD-10-CM | POA: Diagnosis not present

## 2016-02-16 DIAGNOSIS — M25511 Pain in right shoulder: Secondary | ICD-10-CM | POA: Diagnosis not present

## 2016-02-19 DIAGNOSIS — M6281 Muscle weakness (generalized): Secondary | ICD-10-CM | POA: Diagnosis not present

## 2016-02-19 DIAGNOSIS — M542 Cervicalgia: Secondary | ICD-10-CM | POA: Diagnosis not present

## 2016-02-19 DIAGNOSIS — M25511 Pain in right shoulder: Secondary | ICD-10-CM | POA: Diagnosis not present

## 2016-02-19 DIAGNOSIS — M1711 Unilateral primary osteoarthritis, right knee: Secondary | ICD-10-CM | POA: Diagnosis not present

## 2016-02-19 DIAGNOSIS — R0789 Other chest pain: Secondary | ICD-10-CM | POA: Diagnosis not present

## 2016-02-19 DIAGNOSIS — M25561 Pain in right knee: Secondary | ICD-10-CM | POA: Diagnosis not present

## 2016-02-19 DIAGNOSIS — S2231XD Fracture of one rib, right side, subsequent encounter for fracture with routine healing: Secondary | ICD-10-CM | POA: Diagnosis not present

## 2016-02-20 ENCOUNTER — Encounter: Payer: Medicare Other | Attending: Physical Medicine & Rehabilitation | Admitting: Registered Nurse

## 2016-02-20 ENCOUNTER — Encounter: Payer: Self-pay | Admitting: Registered Nurse

## 2016-02-20 VITALS — BP 152/86 | HR 71

## 2016-02-20 DIAGNOSIS — M1711 Unilateral primary osteoarthritis, right knee: Secondary | ICD-10-CM | POA: Insufficient documentation

## 2016-02-20 DIAGNOSIS — M25511 Pain in right shoulder: Secondary | ICD-10-CM | POA: Diagnosis not present

## 2016-02-20 DIAGNOSIS — M75101 Unspecified rotator cuff tear or rupture of right shoulder, not specified as traumatic: Secondary | ICD-10-CM

## 2016-02-20 DIAGNOSIS — M6281 Muscle weakness (generalized): Secondary | ICD-10-CM | POA: Diagnosis not present

## 2016-02-20 DIAGNOSIS — M545 Low back pain, unspecified: Secondary | ICD-10-CM

## 2016-02-20 DIAGNOSIS — R2689 Other abnormalities of gait and mobility: Secondary | ICD-10-CM | POA: Diagnosis not present

## 2016-02-20 DIAGNOSIS — Z79899 Other long term (current) drug therapy: Secondary | ICD-10-CM | POA: Insufficient documentation

## 2016-02-20 DIAGNOSIS — M542 Cervicalgia: Secondary | ICD-10-CM | POA: Diagnosis not present

## 2016-02-20 DIAGNOSIS — M797 Fibromyalgia: Secondary | ICD-10-CM

## 2016-02-20 DIAGNOSIS — Z5181 Encounter for therapeutic drug level monitoring: Secondary | ICD-10-CM | POA: Insufficient documentation

## 2016-02-20 DIAGNOSIS — M1712 Unilateral primary osteoarthritis, left knee: Secondary | ICD-10-CM

## 2016-02-20 DIAGNOSIS — G894 Chronic pain syndrome: Secondary | ICD-10-CM

## 2016-02-20 MED ORDER — OXYCODONE-ACETAMINOPHEN 10-325 MG PO TABS
1.0000 | ORAL_TABLET | Freq: Three times a day (TID) | ORAL | Status: DC | PRN
Start: 2016-02-20 — End: 2016-03-26

## 2016-02-20 NOTE — Progress Notes (Signed)
Subjective:    Patient ID: Linda [name], female    DOB: 1946-10-29, 70 y.o.   MRN: [id number]  HPI: [name] is a 70 year old female who returns for follow up for chronic pain and medication refill. She states her pain is located in her right rib pain, lower back radiating into her right shoulder, lower back and bilateral knees. She rates her pain 7. Her current exercise regime is attending physical therapy five days a week and walking. Also states her Orthopedist Dr. Delilah Shan ordered a Right Shoulder MRI, she's awaiting an appointment.  Pain Inventory Average Pain 6 Pain Right Now 7 My pain is constant, sharp, burning and stabbing  In the last 24 hours, has pain interfered with the following? General activity 8 Relation with others 8 Enjoyment of life 9 What TIME of day is your pain at its worst? All Times Sleep (in general) NA  Pain is worse with: walking, bending, standing and some activites Pain improves with: rest, heat/ice and medication Relief from Meds: 5  Mobility walk with assistance use a walker how many minutes can you walk? 10 ability to climb steps?  no do you drive?  yes transfers alone Do you have any goals in this area?  yes  Function retired  Neuro/Psych bladder control problems weakness trouble walking spasms depression anxiety  Prior Studies Any changes since last visit?  yes  Physicians involved in your care Any changes since last visit?  yes   Family History  Problem Relation Age of Onset  . Arthritis Mother   . Kidney disease Mother   . Heart disease Father   . Stroke Father   . Hypertension Father   . Diabetes Father    Social History   Social History  . Marital Status: Widowed    Spouse Name: N/A  . Number of Children: 2  . Years of Education: N/A   Social History Main Topics  . Smoking status: Never Smoker   . Smokeless tobacco: Never Used     Comment: never used tobacco  . Alcohol Use: No  . Drug  Use: No  . Sexual Activity: Not Currently   Other Topics Concern  . None   Social History Narrative   Widowed, 2 sons.  Relocated to [place] 09/2012 to be closer to her son who has MS.   Husband d 2015--mesothelioma.   Occupation: former Pharmacist, hospital.   Education: masters degree level.   No T/A/Ds.   Past Surgical History  Procedure Laterality Date  . Appendectomy  1960  . Total abdominal hysterectomy  1974  . Tendon release  1996    Right forearm and hand  . Knee surgery  2005  . Heel spur surgery Left 2008  . Plantar fascia release Left 2008  . Axillary surgery Left 1978    Multiple "lump" in armpit per pt  . Coccyx removal  1972  . Cardiac catheterization  01/2013    nonobstructive CAD, EF 55-60%  . Transthoracic echocardiogram  01/2013; 04/2014;08/2015    2014--NORMAL.  2015--focal basal septal hypertrophy, EF 55-60%, grade I diast dysfxn, mild LAE.  08/2015 EF 55-60%, nl LV syst fxn, grade I DD, valves wnl  . Dilation and curettage of uterus  ? 1970's  . Eye surgery Left 2012-2013    "injections for ~ 1 yr; don't really know what for" (07/12/2013)  . Spirometry  04/25/14    In hosp for acute asthma/COPD flare: mixed obstructive and restrictive lung disease.  The FEV1 is severely reduced at 45% predicted.  FEV1 signif decreased compared to prior spirometry 07/23/13.  [name] Kitchen Esophagogastroduodenoscopy N/A 07/19/2014    Gastritis found + in the setting of supratherapeutic INR, +plavix, + meloxicam.  . Left heart catheterization with coronary angiogram N/A 01/30/2013    Procedure: LEFT HEART CATHETERIZATION WITH CORONARY ANGIOGRAM;  Surgeon: Clent Demark, MD;  Location: [facility] CATH LAB;  Service: Cardiovascular;  Laterality: N/A;  . Cardiovascular stress test  02/22/15    Low risk myocard perf imaging; wall motion normal, normal EF   Past Medical History  Diagnosis Date  . HTN (hypertension)   . Depression   . Recurrent UTI     hx of hospitalization for pyelonephritis; started abx prophylaxis 06/2015    . Hay fever   . Mixed incontinence urge and stress   . Diverticular disease   . Insomnia   . Fibromyalgia     Patient states dx was around her late 26s but she had sx's for years prior to this.  . Syncope     Hypotensive; ED visit--Dr. Terrence Dupont did Cath--nonobstructive CAD, EF 55-60%.  In retrospect, suspect pt rx med misuse/polypharmacy  . Idiopathic angio-edema-urticaria 72014    Angioedema component was very minimal  . Asthma     w/ asbestososis   . History of pneumonia     hospitalized 12/2011, 02/2013, and 07/2013 [facility]) for this  . Anginal pain ([place])     Nonobstructive CAD 2014; however, her cardiologist put her on a statin for this and NOT for hyperlipidemia per pt report.  . OSA on CPAP     prior to move to [place]--had another sleep study 10/2015 w/pulm Dr. Camillo Flaming.  . H/O hiatal hernia   . Migraine syndrome     "not as often anymore; used to be ~ q wk" (07/12/2013)  . Tension headache, chronic   . DDD (degenerative disc disease)     lumbar and cervical.   . Osteoarthritis     "severe; progressing fast" (07/12/2013); multiple joints-not surgical candidate for TKR (03/2015)  . Chronic lower back pain   . Anxiety     panic attacks  . Nephrolithiasis     "passed all on my own or they are still in there" (07/12/2013)  . Pyelonephritis     "several times over the last 30 yr" (07/12/2013)  . Diastolic congestive heart failure ([place])   . COPD (chronic obstructive pulmonary disease) ([place])   . Pulmonary embolism ([place]) 07/2013    Dx at [facility] with very small peripheral upper lobe pe 07/2013: pt took coumadin for about 8-9 mo  . Pleural plaque with presence of asbestos 07/22/2013  . BPPV (benign paroxysmal positional vertigo) 12/16/2012  . RBBB (right bundle branch block)   . Acute upper GI bleed 06/2014    while pt taking coumadin, plavix, and meloxicam---despite being told not to take coumadin.  . Iron deficiency anemia     Hematologist in [place], [place] did extensive w/u; no cause found; failed  oral supplement;; gets fairly regular (q61m or so) IV iron infusions (Venofer -iron sucrose- 200mg  with procrit.  "for 14 yr I've been getting blood work q month & getting infusions prn" (07/12/2013).  Dr. Marin Olp locally, iron infusions done, EPO deficiency dx'd  . Pernicious anemia 08/24/2014   There were no vitals taken for this visit.  Opioid Risk Score:   Fall Risk Score:  `1  Depression screen PHQ 2/9  Depression screen [facility] 2/9 08/11/2015 07/12/2015 03/27/2015 03/31/2014  Decreased Interest 3 0 3 0  Down, Depressed, Hopeless 3 1 3  0  PHQ - 2 Score 6 1 6  0  Altered sleeping 2 - 2 -  Tired, decreased energy 3 - 3 -  Change in appetite 0 - 2 -  Feeling bad or failure about yourself  2 - 2 -  Trouble concentrating 0 - 2 -  Moving slowly or fidgety/restless 0 - 0 -  Suicidal thoughts 0 - 0 -  PHQ-9 Score 13 - 17 -     Review of Systems  All other systems reviewed and are negative.      Objective:   Physical Exam  Constitutional: She is oriented to person, place, and time. She appears well-developed and well-nourished.  HENT:  Head: Normocephalic and atraumatic.  Neck: Normal range of motion. Neck supple.  Cardiovascular: Normal rate and regular rhythm.   Pulmonary/Chest: Effort normal and breath sounds normal.  Continuous Oxygen 3 liters nasal cannula  Musculoskeletal:  Normal Muscle Bulk and Muscle Testing Reveals: Upper Extremities: Full ROM and Muscle Strength 5/5 Right AC Joint Tenderness Right Rhomboid Tenderness Lumbar Paraspinal Tenderness: L-3- L-5 Lower Extremities: Full ROM and Muscle Strength 5/5 Arises from chair slowly using walker for support Narrow Based Gait  Neurological: She is alert and oriented to person, place, and time.  Skin: Skin is warm and dry.  Psychiatric: She has a normal mood and affect.  Nursing note and vitals reviewed.         Assessment & Plan:  1. Functional deficits secondary to Gait disorder: Continue with Physical  Therapy. 2.  Chronic Back pain/fibromyalgia/R>L Knee OA Pain Management:  Refilled: Oxycodone 10/325 one tablet every 6 hours #100/ Continue Lyrica and Voltaren Gel  3. Depression with anxiety/Grief reaction/Mood: Continue Trazodone and Cymbalta .  4. [name] with asthma: Oxygen dependent. Albuterol prn.  On Continuous Oxygen Therapy 3 liters/ Pulmonology Following. 5. Bilateral Osteoarthritis of Bilateral Knees: Dr. Juanda Bond Following. 6. Fibromyalgia: Continue with exercise regime.  20 minutes of face to face patient care time was spent during this visit. All questions were encouraged and answered.

## 2016-02-21 DIAGNOSIS — M6281 Muscle weakness (generalized): Secondary | ICD-10-CM | POA: Diagnosis not present

## 2016-02-21 DIAGNOSIS — M542 Cervicalgia: Secondary | ICD-10-CM | POA: Diagnosis not present

## 2016-02-21 DIAGNOSIS — M25511 Pain in right shoulder: Secondary | ICD-10-CM | POA: Diagnosis not present

## 2016-02-21 DIAGNOSIS — Z9181 History of falling: Secondary | ICD-10-CM | POA: Diagnosis not present

## 2016-02-21 DIAGNOSIS — R2681 Unsteadiness on feet: Secondary | ICD-10-CM | POA: Diagnosis not present

## 2016-02-22 DIAGNOSIS — M1712 Unilateral primary osteoarthritis, left knee: Secondary | ICD-10-CM | POA: Diagnosis not present

## 2016-02-22 DIAGNOSIS — M25562 Pain in left knee: Secondary | ICD-10-CM | POA: Diagnosis not present

## 2016-02-24 LAB — TOXASSURE SELECT,+ANTIDEPR,UR: PDF: 0

## 2016-02-26 DIAGNOSIS — M1711 Unilateral primary osteoarthritis, right knee: Secondary | ICD-10-CM | POA: Diagnosis not present

## 2016-02-26 DIAGNOSIS — M25511 Pain in right shoulder: Secondary | ICD-10-CM | POA: Diagnosis not present

## 2016-02-26 DIAGNOSIS — M542 Cervicalgia: Secondary | ICD-10-CM | POA: Diagnosis not present

## 2016-02-26 DIAGNOSIS — M6281 Muscle weakness (generalized): Secondary | ICD-10-CM | POA: Diagnosis not present

## 2016-02-26 DIAGNOSIS — Z9181 History of falling: Secondary | ICD-10-CM | POA: Diagnosis not present

## 2016-02-26 DIAGNOSIS — M25561 Pain in right knee: Secondary | ICD-10-CM | POA: Diagnosis not present

## 2016-02-26 DIAGNOSIS — R2681 Unsteadiness on feet: Secondary | ICD-10-CM | POA: Diagnosis not present

## 2016-02-28 DIAGNOSIS — R2681 Unsteadiness on feet: Secondary | ICD-10-CM | POA: Diagnosis not present

## 2016-02-28 DIAGNOSIS — M542 Cervicalgia: Secondary | ICD-10-CM | POA: Diagnosis not present

## 2016-02-28 DIAGNOSIS — Z9181 History of falling: Secondary | ICD-10-CM | POA: Diagnosis not present

## 2016-02-28 DIAGNOSIS — M25511 Pain in right shoulder: Secondary | ICD-10-CM | POA: Diagnosis not present

## 2016-02-28 DIAGNOSIS — M6281 Muscle weakness (generalized): Secondary | ICD-10-CM | POA: Diagnosis not present

## 2016-02-29 DIAGNOSIS — M6281 Muscle weakness (generalized): Secondary | ICD-10-CM | POA: Diagnosis not present

## 2016-02-29 DIAGNOSIS — Z9181 History of falling: Secondary | ICD-10-CM | POA: Diagnosis not present

## 2016-02-29 DIAGNOSIS — M542 Cervicalgia: Secondary | ICD-10-CM | POA: Diagnosis not present

## 2016-02-29 DIAGNOSIS — M1712 Unilateral primary osteoarthritis, left knee: Secondary | ICD-10-CM | POA: Diagnosis not present

## 2016-02-29 DIAGNOSIS — R2681 Unsteadiness on feet: Secondary | ICD-10-CM | POA: Diagnosis not present

## 2016-02-29 DIAGNOSIS — M25562 Pain in left knee: Secondary | ICD-10-CM | POA: Diagnosis not present

## 2016-02-29 DIAGNOSIS — M25511 Pain in right shoulder: Secondary | ICD-10-CM | POA: Diagnosis not present

## 2016-03-05 NOTE — Progress Notes (Signed)
Urine drug screen for this encounter is consistent for prescribed medication 

## 2016-03-13 ENCOUNTER — Ambulatory Visit: Payer: Medicare Other | Admitting: Family Medicine

## 2016-03-13 DIAGNOSIS — M25562 Pain in left knee: Secondary | ICD-10-CM | POA: Diagnosis not present

## 2016-03-13 DIAGNOSIS — M25561 Pain in right knee: Secondary | ICD-10-CM | POA: Diagnosis not present

## 2016-03-13 DIAGNOSIS — M170 Bilateral primary osteoarthritis of knee: Secondary | ICD-10-CM | POA: Diagnosis not present

## 2016-03-14 DIAGNOSIS — M6281 Muscle weakness (generalized): Secondary | ICD-10-CM | POA: Diagnosis not present

## 2016-03-14 DIAGNOSIS — M542 Cervicalgia: Secondary | ICD-10-CM | POA: Diagnosis not present

## 2016-03-14 DIAGNOSIS — M25511 Pain in right shoulder: Secondary | ICD-10-CM | POA: Diagnosis not present

## 2016-03-14 DIAGNOSIS — Z9181 History of falling: Secondary | ICD-10-CM | POA: Diagnosis not present

## 2016-03-14 DIAGNOSIS — R2681 Unsteadiness on feet: Secondary | ICD-10-CM | POA: Diagnosis not present

## 2016-03-15 DIAGNOSIS — M542 Cervicalgia: Secondary | ICD-10-CM | POA: Diagnosis not present

## 2016-03-15 DIAGNOSIS — Z9181 History of falling: Secondary | ICD-10-CM | POA: Diagnosis not present

## 2016-03-15 DIAGNOSIS — R2681 Unsteadiness on feet: Secondary | ICD-10-CM | POA: Diagnosis not present

## 2016-03-15 DIAGNOSIS — M25511 Pain in right shoulder: Secondary | ICD-10-CM | POA: Diagnosis not present

## 2016-03-15 DIAGNOSIS — M6281 Muscle weakness (generalized): Secondary | ICD-10-CM | POA: Diagnosis not present

## 2016-03-18 ENCOUNTER — Other Ambulatory Visit: Payer: Self-pay | Admitting: *Deleted

## 2016-03-18 DIAGNOSIS — M542 Cervicalgia: Secondary | ICD-10-CM | POA: Diagnosis not present

## 2016-03-18 DIAGNOSIS — Z9181 History of falling: Secondary | ICD-10-CM | POA: Diagnosis not present

## 2016-03-18 DIAGNOSIS — M6281 Muscle weakness (generalized): Secondary | ICD-10-CM | POA: Diagnosis not present

## 2016-03-18 DIAGNOSIS — M25511 Pain in right shoulder: Secondary | ICD-10-CM | POA: Diagnosis not present

## 2016-03-18 DIAGNOSIS — R2681 Unsteadiness on feet: Secondary | ICD-10-CM | POA: Diagnosis not present

## 2016-03-18 MED ORDER — ALPRAZOLAM 0.5 MG PO TABS: 0.5000 mg | ORAL_TABLET | Freq: Two times a day (BID) | ORAL | Status: DC | PRN

## 2016-03-18 NOTE — Telephone Encounter (Signed)
Rx faxed

## 2016-03-18 NOTE — Telephone Encounter (Signed)
RF request for alprazolam LOV: 02/02/16 Next ov: 04/01/16 Last written: 01/03/16 #20 w/ 0RF  Please advise. Thanks.

## 2016-03-20 DIAGNOSIS — M6281 Muscle weakness (generalized): Secondary | ICD-10-CM | POA: Diagnosis not present

## 2016-03-20 DIAGNOSIS — M542 Cervicalgia: Secondary | ICD-10-CM | POA: Diagnosis not present

## 2016-03-20 DIAGNOSIS — Z9181 History of falling: Secondary | ICD-10-CM | POA: Diagnosis not present

## 2016-03-20 DIAGNOSIS — R2681 Unsteadiness on feet: Secondary | ICD-10-CM | POA: Diagnosis not present

## 2016-03-20 DIAGNOSIS — M25511 Pain in right shoulder: Secondary | ICD-10-CM | POA: Diagnosis not present

## 2016-03-21 DIAGNOSIS — M25511 Pain in right shoulder: Secondary | ICD-10-CM | POA: Diagnosis not present

## 2016-03-21 DIAGNOSIS — M6281 Muscle weakness (generalized): Secondary | ICD-10-CM | POA: Diagnosis not present

## 2016-03-21 DIAGNOSIS — Z9181 History of falling: Secondary | ICD-10-CM | POA: Diagnosis not present

## 2016-03-21 DIAGNOSIS — M542 Cervicalgia: Secondary | ICD-10-CM | POA: Diagnosis not present

## 2016-03-21 DIAGNOSIS — R2681 Unsteadiness on feet: Secondary | ICD-10-CM | POA: Diagnosis not present

## 2016-03-22 DIAGNOSIS — M25511 Pain in right shoulder: Secondary | ICD-10-CM | POA: Diagnosis not present

## 2016-03-22 DIAGNOSIS — M6281 Muscle weakness (generalized): Secondary | ICD-10-CM | POA: Diagnosis not present

## 2016-03-22 DIAGNOSIS — Z9181 History of falling: Secondary | ICD-10-CM | POA: Diagnosis not present

## 2016-03-22 DIAGNOSIS — M542 Cervicalgia: Secondary | ICD-10-CM | POA: Diagnosis not present

## 2016-03-22 DIAGNOSIS — R2681 Unsteadiness on feet: Secondary | ICD-10-CM | POA: Diagnosis not present

## 2016-03-25 DIAGNOSIS — M25511 Pain in right shoulder: Secondary | ICD-10-CM | POA: Diagnosis not present

## 2016-03-25 DIAGNOSIS — M542 Cervicalgia: Secondary | ICD-10-CM | POA: Diagnosis not present

## 2016-03-25 DIAGNOSIS — R2681 Unsteadiness on feet: Secondary | ICD-10-CM | POA: Diagnosis not present

## 2016-03-25 DIAGNOSIS — M6281 Muscle weakness (generalized): Secondary | ICD-10-CM | POA: Diagnosis not present

## 2016-03-25 DIAGNOSIS — Z9181 History of falling: Secondary | ICD-10-CM | POA: Diagnosis not present

## 2016-03-26 ENCOUNTER — Telehealth: Payer: Self-pay | Admitting: Cardiology

## 2016-03-26 ENCOUNTER — Encounter: Payer: Medicare Other | Attending: Physical Medicine & Rehabilitation | Admitting: Registered Nurse

## 2016-03-26 ENCOUNTER — Encounter: Payer: Self-pay | Admitting: Registered Nurse

## 2016-03-26 VITALS — BP 125/73 | HR 77

## 2016-03-26 DIAGNOSIS — G894 Chronic pain syndrome: Secondary | ICD-10-CM

## 2016-03-26 DIAGNOSIS — M545 Low back pain, unspecified: Secondary | ICD-10-CM

## 2016-03-26 DIAGNOSIS — M75101 Unspecified rotator cuff tear or rupture of right shoulder, not specified as traumatic: Secondary | ICD-10-CM | POA: Diagnosis not present

## 2016-03-26 DIAGNOSIS — M1711 Unilateral primary osteoarthritis, right knee: Secondary | ICD-10-CM

## 2016-03-26 DIAGNOSIS — Z5181 Encounter for therapeutic drug level monitoring: Secondary | ICD-10-CM | POA: Diagnosis not present

## 2016-03-26 DIAGNOSIS — M797 Fibromyalgia: Secondary | ICD-10-CM | POA: Insufficient documentation

## 2016-03-26 DIAGNOSIS — R2689 Other abnormalities of gait and mobility: Secondary | ICD-10-CM | POA: Insufficient documentation

## 2016-03-26 DIAGNOSIS — Z79899 Other long term (current) drug therapy: Secondary | ICD-10-CM | POA: Diagnosis not present

## 2016-03-26 DIAGNOSIS — M1712 Unilateral primary osteoarthritis, left knee: Secondary | ICD-10-CM

## 2016-03-26 MED ORDER — OXYCODONE-ACETAMINOPHEN 10-325 MG PO TABS: 1.0000 | ORAL_TABLET | Freq: Three times a day (TID) | ORAL | Status: DC | PRN

## 2016-03-26 NOTE — Telephone Encounter (Signed)
Spoke with pt, she is up 3 lbs, SOB with exertion and her feet and legs are swollen. Her current furosemide dose is 20 mg once daily. Pt advised to take 20 mg now, and then 40 mg for the next 2 mornings. She will call back if no change or symptoms worsen.

## 2016-03-26 NOTE — Telephone Encounter (Signed)
New Message  Pt stated- she gained 3 lbs over the night last night- Please call back and discuss.

## 2016-03-26 NOTE — Progress Notes (Signed)
Subjective:    Patient ID: Linda [name], female    DOB: [birth year], 70 y.o.   MRN: [id number]  HPI: [name] is a 70 year old female who returns for follow up for chronic pain and medication refill. She states her pain is located in her right shoulder, lower back and bilateral knees. She rates her pain 5. Her current exercise regime is attending physical and occupational therapy three days a week at [facility] and walking.  She states she went to visit her daughter in [place] last month for a week.  Pain Inventory Average Pain 7 Pain Right Now 5 My pain is constant, sharp and aching  In the last 24 hours, has pain interfered with the following? General activity 9 Relation with others 9 Enjoyment of life 9 What TIME of day is your pain at its worst? all times Sleep (in general) Poor  Pain is worse with: walking, bending, standing and some activites Pain improves with: rest, heat/ice and medication Relief from Meds: 5  Mobility use a walker how many minutes can you walk? 10 ability to climb steps?  no do you drive?  yes transfers alone  Function retired I need assistance with the following:  dressing, meal prep, household duties and shopping  Neuro/Psych bowel control problems weakness trouble walking depression anxiety  Prior Studies Any changes since last visit?  no  Physicians involved in your care Any changes since last visit?  no   Family History  Problem Relation Age of Onset  . Arthritis Mother   . Kidney disease Mother   . Heart disease Father   . Stroke Father   . Hypertension Father   . Diabetes Father    Social History   Social History  . Marital Status: Widowed    Spouse Name: N/A  . Number of Children: 2  . Years of Education: N/A   Social History Main Topics  . Smoking status: Never Smoker   . Smokeless tobacco: Never Used     Comment: never used tobacco  . Alcohol Use: No  . Drug Use: No  . Sexual Activity:  Not Currently   Other Topics Concern  . Not on file   Social History Narrative   Widowed, 2 sons.  Relocated to [place] 09/2012 to be closer to her son who has MS.   Husband d 2015--mesothelioma.   Occupation: former Pharmacist, hospital.   Education: masters degree level.   No T/A/Ds.   Past Surgical History  Procedure Laterality Date  . Appendectomy  1960  . Total abdominal hysterectomy  1974  . Tendon release  1996    Right forearm and hand  . Knee surgery  2005  . Heel spur surgery Left 2008  . Plantar fascia release Left 2008  . Axillary surgery Left 1978    Multiple "lump" in armpit per pt  . Coccyx removal  1972  . Cardiac catheterization  01/2013    nonobstructive CAD, EF 55-60%  . Transthoracic echocardiogram  01/2013; 04/2014;08/2015    2014--NORMAL.  2015--focal basal septal hypertrophy, EF 55-60%, grade I diast dysfxn, mild LAE.  08/2015 EF 55-60%, nl LV syst fxn, grade I DD, valves wnl  . Dilation and curettage of uterus  ? 1970's  . Eye surgery Left 2012-2013    "injections for ~ 1 yr; don't really know what for" (07/12/2013)  . Spirometry  04/25/14    In hosp for acute asthma/COPD flare: mixed obstructive and restrictive lung disease. The  FEV1 is severely reduced at 45% predicted.  FEV1 signif decreased compared to prior spirometry 07/23/13.  [name] Kitchen Esophagogastroduodenoscopy N/A 07/19/2014    Gastritis found + in the setting of supratherapeutic INR, +plavix, + meloxicam.  . Left heart catheterization with coronary angiogram N/A 01/30/2013    Procedure: LEFT HEART CATHETERIZATION WITH CORONARY ANGIOGRAM;  Surgeon: Clent Demark, MD;  Location: [facility] CATH LAB;  Service: Cardiovascular;  Laterality: N/A;  . Cardiovascular stress test  02/22/15    Low risk myocard perf imaging; wall motion normal, normal EF   Past Medical History  Diagnosis Date  . HTN (hypertension)   . Depression   . Recurrent UTI     hx of hospitalization for pyelonephritis; started abx prophylaxis 06/2015  . Hay fever   .  Mixed incontinence urge and stress   . Diverticular disease   . Insomnia   . Fibromyalgia     Patient states dx was around her late 109s but she had sx's for years prior to this.  . Syncope     Hypotensive; ED visit--Dr. Terrence Dupont did Cath--nonobstructive CAD, EF 55-60%.  In retrospect, suspect pt rx med misuse/polypharmacy  . Idiopathic angio-edema-urticaria 72014    Angioedema component was very minimal  . Asthma     w/ asbestososis   . History of pneumonia     hospitalized 12/2011, 02/2013, and 07/2013 [facility]) for this  . Anginal pain ([place])     Nonobstructive CAD 2014; however, her cardiologist put her on a statin for this and NOT for hyperlipidemia per pt report.  . OSA on CPAP     prior to move to [place]--had another sleep study 10/2015 w/pulm Dr. Camillo Flaming.  . H/O hiatal hernia   . Migraine syndrome     "not as often anymore; used to be ~ q wk" (07/12/2013)  . Tension headache, chronic   . DDD (degenerative disc disease)     lumbar and cervical.   . Osteoarthritis     "severe; progressing fast" (07/12/2013); multiple joints-not surgical candidate for TKR (03/2015)  . Chronic lower back pain   . Anxiety     panic attacks  . Nephrolithiasis     "passed all on my own or they are still in there" (07/12/2013)  . Pyelonephritis     "several times over the last 30 yr" (07/12/2013)  . Diastolic congestive heart failure ([place])   . COPD (chronic obstructive pulmonary disease) ([place])   . Pulmonary embolism ([employer]) 07/2013    Dx at [facility] with very small peripheral upper lobe pe 07/2013: pt took coumadin for about 8-9 mo  . Pleural plaque with presence of asbestos 07/22/2013  . BPPV (benign paroxysmal positional vertigo) 12/16/2012  . RBBB (right bundle branch block)   . Acute upper GI bleed 06/2014    while pt taking coumadin, plavix, and meloxicam---despite being told not to take coumadin.  . Iron deficiency anemia     Hematologist in [place], [place] did extensive w/u; no cause found; failed oral supplement;;  gets fairly regular (q67m or so) IV iron infusions (Venofer -iron sucrose- 200mg  with procrit.  "for 14 yr I've been getting blood work q month & getting infusions prn" (07/12/2013).  Dr. Marin Olp locally, iron infusions done, EPO deficiency dx'd  . Pernicious anemia 08/24/2014   There were no vitals taken for this visit.  Opioid Risk Score:   Fall Risk Score:  `1  Depression screen PHQ 2/9  Depression screen [facility] 2/9 08/11/2015 07/12/2015 03/27/2015 03/31/2014  Decreased Interest  3 0 3 0  Down, Depressed, Hopeless 3 1 3  0  PHQ - 2 Score 6 1 6  0  Altered sleeping 2 - 2 -  Tired, decreased energy 3 - 3 -  Change in appetite 0 - 2 -  Feeling bad or failure about yourself  2 - 2 -  Trouble concentrating 0 - 2 -  Moving slowly or fidgety/restless 0 - 0 -  Suicidal thoughts 0 - 0 -  PHQ-9 Score 13 - 17 -     Review of Systems     Objective:   Physical Exam  Constitutional: She is oriented to person, place, and time. She appears well-developed and well-nourished.  HENT:  Head: Normocephalic and atraumatic.  Neck: Normal range of motion. Neck supple.  Cardiovascular: Normal rate and regular rhythm.   Pulmonary/Chest: Effort normal and breath sounds normal.  Continuous Oxygen 3 liters nasal cannula  Musculoskeletal:  Normal Muscle Bulk and Muscle Testing Reveals: Upper Extremities: Full ROM and Muscle Strength 5/5 Lumbar Paraspinal Tenderness: L-3-L-5 Lower Extremities: Full ROM and Muscle Strength 5/5 Bilateral Lower Extremities Flexion Produces pain into Bilateral Patella's Arises from chair slowly using walker for support Antalgic gait  Neurological: She is alert and oriented to person, place, and time.  Skin: Skin is warm and dry.  Psychiatric: She has a normal mood and affect.  Nursing note and vitals reviewed.         Assessment & Plan:  1. Functional deficits secondary to Gait disorder: Continue with Physical Therapy. 2. Chronic Back pain/fibromyalgia/R>L Knee OA Pain  Management:  Refilled: Oxycodone 10/325 one tablet every 6 hours #100/ Continue Lyrica and Voltaren Gel  3. Depression with anxiety/Grief reaction/Mood: Continue Trazodone and Cymbalta .  4. [name] with asthma: Oxygen dependent. Albuterol prn.  On Continuous Oxygen Therapy 3 liters/ Pulmonology Following. 5. Bilateral Osteoarthritis of Bilateral Knees: Dr. Juanda Bond Following. 6. Fibromyalgia: Continue with exercise regime.  20 minutes of face to face patient care time was spent during this visit. All questions were encouraged and answered.

## 2016-03-27 DIAGNOSIS — M542 Cervicalgia: Secondary | ICD-10-CM | POA: Diagnosis not present

## 2016-03-27 DIAGNOSIS — R2681 Unsteadiness on feet: Secondary | ICD-10-CM | POA: Diagnosis not present

## 2016-03-27 DIAGNOSIS — M25511 Pain in right shoulder: Secondary | ICD-10-CM | POA: Diagnosis not present

## 2016-03-27 DIAGNOSIS — Z9181 History of falling: Secondary | ICD-10-CM | POA: Diagnosis not present

## 2016-03-27 DIAGNOSIS — M6281 Muscle weakness (generalized): Secondary | ICD-10-CM | POA: Diagnosis not present

## 2016-03-29 DIAGNOSIS — Z9181 History of falling: Secondary | ICD-10-CM | POA: Diagnosis not present

## 2016-03-29 DIAGNOSIS — R2681 Unsteadiness on feet: Secondary | ICD-10-CM | POA: Diagnosis not present

## 2016-03-29 DIAGNOSIS — M542 Cervicalgia: Secondary | ICD-10-CM | POA: Diagnosis not present

## 2016-03-29 DIAGNOSIS — M25511 Pain in right shoulder: Secondary | ICD-10-CM | POA: Diagnosis not present

## 2016-03-29 DIAGNOSIS — M6281 Muscle weakness (generalized): Secondary | ICD-10-CM | POA: Diagnosis not present

## 2016-03-30 ENCOUNTER — Telehealth: Payer: Self-pay | Admitting: Cardiology

## 2016-03-30 NOTE — Telephone Encounter (Signed)
Pt called with increase of wt of 5 lbs.    Over night.  I asked her to increase lasix to 40 mg today and tomorrow then call the office on Monday to let us know how she is doing.  She may have some chest tightness with the increase of volume and mostly SOB and edema, no change in diet.

## 2016-04-01 ENCOUNTER — Encounter: Payer: Self-pay | Admitting: Family Medicine

## 2016-04-01 ENCOUNTER — Telehealth: Payer: Self-pay | Admitting: Cardiology

## 2016-04-01 ENCOUNTER — Other Ambulatory Visit: Payer: Self-pay | Admitting: Family Medicine

## 2016-04-01 ENCOUNTER — Ambulatory Visit (INDEPENDENT_AMBULATORY_CARE_PROVIDER_SITE_OTHER): Payer: Medicare Other | Admitting: Family Medicine

## 2016-04-01 VITALS — BP 125/72 | HR 77 | Temp 98.0°F | Resp 16 | Ht 64.0 in | Wt 175.8 lb

## 2016-04-01 DIAGNOSIS — R609 Edema, unspecified: Secondary | ICD-10-CM

## 2016-04-01 DIAGNOSIS — F411 Generalized anxiety disorder: Secondary | ICD-10-CM

## 2016-04-01 DIAGNOSIS — F410 Panic disorder [episodic paroxysmal anxiety] without agoraphobia: Secondary | ICD-10-CM

## 2016-04-01 DIAGNOSIS — M542 Cervicalgia: Secondary | ICD-10-CM | POA: Diagnosis not present

## 2016-04-01 DIAGNOSIS — R0600 Dyspnea, unspecified: Secondary | ICD-10-CM | POA: Diagnosis not present

## 2016-04-01 DIAGNOSIS — Z9181 History of falling: Secondary | ICD-10-CM | POA: Diagnosis not present

## 2016-04-01 DIAGNOSIS — R2681 Unsteadiness on feet: Secondary | ICD-10-CM | POA: Diagnosis not present

## 2016-04-01 DIAGNOSIS — F331 Major depressive disorder, recurrent, moderate: Secondary | ICD-10-CM | POA: Diagnosis not present

## 2016-04-01 DIAGNOSIS — M6281 Muscle weakness (generalized): Secondary | ICD-10-CM | POA: Diagnosis not present

## 2016-04-01 DIAGNOSIS — M25511 Pain in right shoulder: Secondary | ICD-10-CM | POA: Diagnosis not present

## 2016-04-01 LAB — BASIC METABOLIC PANEL
BUN: 13 mg/dL (ref 6–23)
CALCIUM: 9.3 mg/dL (ref 8.4–10.5)
CO2: 32 mEq/L (ref 19–32)
CREATININE: 0.73 mg/dL (ref 0.40–1.20)
Chloride: 101 mEq/L (ref 96–112)
GFR: 83.76 mL/min (ref >60.00)
GLUCOSE: 83 mg/dL (ref 70–99)
Potassium: 3.3 mEq/L — ABNORMAL LOW (ref 3.5–5.1)
SODIUM: 141 meq/L (ref 135–145)

## 2016-04-01 MED ORDER — FUROSEMIDE 40 MG PO TABS: 40.0000 mg | ORAL_TABLET | Freq: Every day | ORAL | Status: DC

## 2016-04-01 MED ORDER — POTASSIUM CHLORIDE CRYS ER 20 MEQ PO TBCR: EXTENDED_RELEASE_TABLET | ORAL | Status: DC

## 2016-04-01 MED ORDER — ALPRAZOLAM 1 MG PO TABS
1.0000 mg | ORAL_TABLET | Freq: Three times a day (TID) | ORAL | Status: DC | PRN
Start: 2016-04-01 — End: 2016-06-06

## 2016-04-01 NOTE — Telephone Encounter (Signed)
Pt wt still up. Some SOB.  Advised to continue on increased lasix dose recommended by Cecilie Kicks,   Notes she thought she had appt w/ Dr. Stanford Breed this week, reviewed this, looks like a May 15th appt. I recommended to be seen by provider in interim. Scheduled to see Mickel Baas tomorrow at 2pm.

## 2016-04-01 NOTE — Telephone Encounter (Signed)
See update

## 2016-04-01 NOTE — Progress Notes (Signed)
OFFICE VISIT  04/01/2016   CC:  Chief Complaint  Patient presents with  . Follow-up    Pt is not fasting.    HPI:    Patient is a 70 y.o. Caucasian female who presents for 2 mo f/u treatment resistant MDD complicated by prolonged grief reaction.  Last time I saw her I increased her abilify from 5mg  to 10mg  qd.  Sounds like she picked up the 5mg  rx and has still been taking this dose daily---sounds like the 10mg  abilify rx I sent last time did not get filled by pt (inadvertantly).  Says mood has been fair, but she still wants to go ahead and try the increase.   Once again, she complains about her meds--says her xanax dose was "lowered" by me when I recently RF'd this med 03/18/16. I had simply continued the dosing that was started at her most recent hospital d/c.  She HAD been on this med at 1 mg tid prior to this, says she sometimes would only take 2 per day.  She notes more worry and mini-panic attacks.    She has been having more LE edema/wt gain the last week or so.   The on call cardiologist this weekend instructed her to take a WHOLE 40mg  lasix tab qd x 3d instead of her usual 1/2 tab qd dosing.  She had noted improvement but then over the last 12-18 h she says she gained more wt.  She says "taking the 1/2 tab doesn't seem to work anymore".  Denies any change in diet or fluid intake.  Past Medical History  Diagnosis Date  . HTN (hypertension)   . Depression   . Recurrent UTI     hx of hospitalization for pyelonephritis; started abx prophylaxis 06/2015  . Hay fever   . Mixed incontinence urge and stress   . Diverticular disease   . Insomnia   . Fibromyalgia     Patient states dx was around her late 68s but she had sx's for years prior to this.  . Syncope     Hypotensive; ED visit--Dr. Terrence Dupont did Cath--nonobstructive CAD, EF 55-60%.  In retrospect, suspect pt rx med misuse/polypharmacy  . Idiopathic angio-edema-urticaria 72014    Angioedema component was very minimal  . Asthma      w/ asbestososis   . History of pneumonia     hospitalized 12/2011, 02/2013, and 07/2013 [facility]) for this  . Anginal pain ([place])     Nonobstructive CAD 2014; however, her cardiologist put her on a statin for this and NOT for hyperlipidemia per pt report.  . OSA on CPAP     prior to move to [place]--had another sleep study 10/2015 w/pulm Dr. Camillo Flaming.  . H/O hiatal hernia   . Migraine syndrome     "not as often anymore; used to be ~ q wk" (07/12/2013)  . Tension headache, chronic   . DDD (degenerative disc disease)     lumbar and cervical.   . Osteoarthritis     "severe; progressing fast" (07/12/2013); multiple joints-not surgical candidate for TKR (03/2015)  . Chronic lower back pain   . Anxiety     panic attacks  . Nephrolithiasis     "passed all on my own or they are still in there" (07/12/2013)  . Pyelonephritis     "several times over the last 30 yr" (07/12/2013)  . Diastolic congestive heart failure ([place])   . COPD (chronic obstructive pulmonary disease) ([place])   . Pulmonary embolism ([place]) 07/2013  Dx at [facility] with very small peripheral upper lobe pe 07/2013: pt took coumadin for about 8-9 mo  . Pleural plaque with presence of asbestos 07/22/2013  . BPPV (benign paroxysmal positional vertigo) 12/16/2012  . RBBB (right bundle branch block)   . Acute upper GI bleed 06/2014    while pt taking coumadin, plavix, and meloxicam---despite being told not to take coumadin.  . Iron deficiency anemia     Hematologist in [place], [place] did extensive w/u; no cause found; failed oral supplement;; gets fairly regular (q83m or so) IV iron infusions (Venofer -iron sucrose- 200mg  with procrit.  "for 14 yr I've been getting blood work q month & getting infusions prn" (07/12/2013).  Dr. Marin Olp locally, iron infusions done, EPO deficiency dx'd  . Pernicious anemia 08/24/2014    Past Surgical History  Procedure Laterality Date  . Appendectomy  1960  . Total abdominal hysterectomy  1974  . Tendon release  1996    Right  forearm and hand  . Knee surgery  2005  . Heel spur surgery Left 2008  . Plantar fascia release Left 2008  . Axillary surgery Left 1978    Multiple "lump" in armpit per pt  . Coccyx removal  1972  . Cardiac catheterization  01/2013    nonobstructive CAD, EF 55-60%  . Transthoracic echocardiogram  01/2013; 04/2014;08/2015    2014--NORMAL.  2015--focal basal septal hypertrophy, EF 55-60%, grade I diast dysfxn, mild LAE.  08/2015 EF 55-60%, nl LV syst fxn, grade I DD, valves wnl  . Dilation and curettage of uterus  ? 1970's  . Eye surgery Left 2012-2013    "injections for ~ 1 yr; don't really know what for" (07/12/2013)  . Spirometry  04/25/14    In hosp for acute asthma/COPD flare: mixed obstructive and restrictive lung disease. The FEV1 is severely reduced at 45% predicted.  FEV1 signif decreased compared to prior spirometry 07/23/13.  [name] Kitchen Esophagogastroduodenoscopy N/A 07/19/2014    Gastritis found + in the setting of supratherapeutic INR, +plavix, + meloxicam.  . Left heart catheterization with coronary angiogram N/A 01/30/2013    Procedure: LEFT HEART CATHETERIZATION WITH CORONARY ANGIOGRAM;  Surgeon: Clent Demark, MD;  Location: [facility] CATH LAB;  Service: Cardiovascular;  Laterality: N/A;  . Cardiovascular stress test  02/22/15    Low risk myocard perf imaging; wall motion normal, normal EF    Outpatient Prescriptions Prior to Visit  Medication Sig Dispense Refill  . albuterol (PROVENTIL HFA;VENTOLIN HFA) 108 (90 BASE) MCG/ACT inhaler Inhale 2 puffs into the lungs every 6 (six) hours as needed for wheezing or shortness of breath. 1 Inhaler 1  . ARIPiprazole (ABILIFY) 10 MG tablet Take 1 tablet (10 mg total) by mouth daily. 30 tablet 2  . aspirin 81 MG EC tablet Take 1 tablet (81 mg total) by mouth daily. 30 tablet   . budesonide-formoterol (SYMBICORT) 160-4.5 MCG/ACT inhaler Inhale 2 puffs into the lungs 2 (two) times daily. 1 Inhaler 12  . BYSTOLIC 5 MG tablet TAKE 1 TABLET ONCE DAILY. 30 tablet 12   . Cholecalciferol (VITAMIN D3) 50000 UNITS CAPS Take 50,000 Units by mouth once a week. (Patient taking differently: Take 50,000 Units by mouth once a week. Take on Mondays) 12 capsule 0  . diclofenac sodium (VOLTAREN) 1 % GEL Apply 4 g topically 3 (three) times daily. TO BOTH KNEES (Patient taking differently: Apply 4 g topically daily as needed (knee pain). ) 100 g 6  . DULoxetine (CYMBALTA) 30 MG capsule TAKE 1  CAPSULE ONCE DAILY ALONG WITH 60MG  CAPSULE FOR TOTAL 90MG  DAILY. 30 capsule 11  . DULoxetine (CYMBALTA) 60 MG capsule 1 cap po qd to be combined with a 30 mg duloxetine cap daily (Patient taking differently: Take 60 mg by mouth at bedtime. to be combined with a 30 mg duloxetine cap daily) 90 capsule 1  . folic acid (FOLVITE) 1 MG tablet Take 2 tablets (2 mg total) by mouth daily. 60 tablet 6  . isosorbide mononitrate (IMDUR) 30 MG 24 hr tablet TAKE 1 TABLET ONCE DAILY. 30 tablet 6  . LYRICA 300 MG capsule TAKE (1) CAPSULE TWICE DAILY. 60 capsule 0  . Misc Natural Products (OSTEO BI-FLEX ADV JOINT SHIELD) TABS Take 1 tablet by mouth daily.    . Multiple Vitamin (MULTIVITAMIN WITH MINERALS) TABS tablet Take 1 tablet by mouth daily.    . nitroGLYCERIN (NITROSTAT) 0.4 MG SL tablet Place 0.4 mg under the tongue every 5 (five) minutes as needed for chest pain.    [name] Kitchen ondansetron (ZOFRAN) 4 MG tablet Take 1 tablet (4 mg total) by mouth every 6 (six) hours. (Patient taking differently: Take 4 mg by mouth every 6 (six) hours as needed for nausea or vomiting. ) 12 tablet 0  . oxyCODONE-acetaminophen (PERCOCET) 10-325 MG tablet Take 1 tablet by mouth every 8 (eight) hours as needed for pain. ONE MONTH SUPPLY 100 tablet 0  . OXYGEN Inhale 3 L into the lungs continuous.    . pantoprazole (PROTONIX) 40 MG tablet Take 1 tablet (40 mg total) by mouth daily. 90 tablet 1  . Polyethyl Glycol-Propyl Glycol (SYSTANE) 0.4-0.3 % SOLN Place 1 drop into both eyes daily.    . polyethylene glycol powder  (GLYCOLAX/MIRALAX) powder Take 17 g by mouth daily. (Patient taking differently: Take 17 g by mouth daily as needed for mild constipation or moderate constipation. ) 3350 g 6  . potassium chloride SA (K-DUR,KLOR-CON) 20 MEQ tablet Take 2 tablets (40 mEq total) by mouth daily. 60 tablet 5  . traZODone (DESYREL) 50 MG tablet 2-4 tabs po qhs prn insomnia 120 tablet 6  . ALPRAZolam (XANAX) 0.5 MG tablet Take 1 tablet (0.5 mg total) by mouth 2 (two) times daily as needed for anxiety. 30 tablet 1  . furosemide (LASIX) 40 MG tablet Take 0.5 tablets (20 mg total) by mouth daily. 30 tablet 5  . silver sulfADIAZINE (SILVADENE) 1 % cream Apply 1 application topically daily. Reported on 04/01/2016     Facility-Administered Medications Prior to Visit  Medication Dose Route Frequency Provider Last Rate Last Dose  . ferumoxytol (FERAHEME) 510 mg in sodium chloride 0.9 % 100 mL IVPB  510 mg Intravenous Once Volanda Napoleon, MD        Allergies  Allergen Reactions  . Penicillins Itching, Swelling and Rash    Tolerated Cefepime in ED. Has patient had a PCN reaction causing immediate rash, facial/tongue/throat swelling, SOB or lightheadedness with hypotension: Yes Has patient had a PCN reaction causing severe rash involving mucus membranes or skin necrosis: No Has patient had a PCN reaction that required hospitalization: No  Has patient had a PCN reaction occurring within the last 10 years: No     ROS As per HPI  PE: Blood pressure 125/72, pulse 77, temperature 98 F (36.7 C), temperature source Oral, resp. rate 16, height 5\' 4"  (1.626 m), weight 175 lb 12 oz (79.72 kg), SpO2 92 %. Room air. Gen: Alert, well appearing.  Patient is oriented to person, place, time, and  situation. CV: RRR LUNGS: CTA bilat, nonlabored resps EXT: 2-3+ pitting edema in both LLs from knees down into feet.  No rash or skin breakdown.  LABS:  Lab Results  Component Value Date   TSH 0.539 01/02/2016   Lab Results   Component Value Date   WBC 6.6 01/08/2016   HGB 13.1 01/08/2016   HCT 39.9 01/08/2016   MCV 88.2 01/08/2016   PLT 215.0 01/08/2016   Lab Results  Component Value Date   CREATININE 0.80 01/08/2016   BUN 13 01/08/2016   NA 137 01/08/2016   K 4.4 01/08/2016   CL 102 01/08/2016   CO2 25 01/08/2016   Lab Results  Component Value Date   ALT 12* 01/03/2016   AST 24 01/03/2016   ALKPHOS 64 01/03/2016   BILITOT 0.6 01/03/2016   Lab Results  Component Value Date   CHOL 152 01/31/2013   Lab Results  Component Value Date   HDL 44 01/31/2013   Lab Results  Component Value Date   LDLCALC 90 01/31/2013   Lab Results  Component Value Date   TRIG 92 01/31/2013   Lab Results  Component Value Date   CHOLHDL 3.5 01/31/2013   Lab Results  Component Value Date   HGBA1C 5.6 01/02/2016   IMPRESSION AND PLAN:  1) Anxiety d/o+ panic: inadvertently lowered her dose a couple of weeks ago. Will get her back on xanax 1mg  tid--new rx faxed to her pharmacy today for #90, RF x 5.  2) MDD, complicated by prolonged grief rxn: increase abilify from 5mg  to 10mg  qd--will call pharmacy to make sure she gets approp rx filled.  3) LE edema: suspect venous insufficiency (no overt sign of CHF): ok to stay on lasix 40mg  qd dosing at this time.  Watch Na intake.   Recheck BMET today.  An After Visit Summary was printed and given to the patient.   FOLLOW UP: Return in about 2 months (around 06/01/2016) for f/u mood/anx (30 min).  Signed:  Crissie Sickles, MD           04/01/2016

## 2016-04-01 NOTE — Progress Notes (Signed)
Pre visit review using our clinic review tool, if applicable. No additional management support is needed unless otherwise documented below in the visit note. 

## 2016-04-01 NOTE — Telephone Encounter (Signed)
NEW MESSAGE   Pt wants to inform rn that she has gained 5 more pounds and that she needs to know   How much lasix can she take additional?

## 2016-04-02 ENCOUNTER — Other Ambulatory Visit: Payer: Self-pay | Admitting: Cardiology

## 2016-04-02 ENCOUNTER — Ambulatory Visit (INDEPENDENT_AMBULATORY_CARE_PROVIDER_SITE_OTHER): Payer: Medicare Other | Admitting: Cardiology

## 2016-04-02 ENCOUNTER — Telehealth: Payer: Self-pay

## 2016-04-02 ENCOUNTER — Encounter: Payer: Self-pay | Admitting: Cardiology

## 2016-04-02 VITALS — BP 138/84 | HR 71 | Ht 64.0 in | Wt 176.6 lb

## 2016-04-02 DIAGNOSIS — I2510 Atherosclerotic heart disease of native coronary artery without angina pectoris: Secondary | ICD-10-CM

## 2016-04-02 DIAGNOSIS — I10 Essential (primary) hypertension: Secondary | ICD-10-CM

## 2016-04-02 DIAGNOSIS — E785 Hyperlipidemia, unspecified: Secondary | ICD-10-CM

## 2016-04-02 DIAGNOSIS — R0600 Dyspnea, unspecified: Secondary | ICD-10-CM

## 2016-04-02 DIAGNOSIS — I5031 Acute diastolic (congestive) heart failure: Secondary | ICD-10-CM | POA: Diagnosis not present

## 2016-04-02 DIAGNOSIS — I2583 Coronary atherosclerosis due to lipid rich plaque: Secondary | ICD-10-CM

## 2016-04-02 DIAGNOSIS — R0602 Shortness of breath: Secondary | ICD-10-CM

## 2016-04-02 DIAGNOSIS — R600 Localized edema: Secondary | ICD-10-CM

## 2016-04-02 MED ORDER — POTASSIUM CHLORIDE CRYS ER 20 MEQ PO TBCR: EXTENDED_RELEASE_TABLET | ORAL | Status: DC

## 2016-04-02 MED ORDER — FUROSEMIDE 40 MG PO TABS: ORAL_TABLET | ORAL | Status: DC

## 2016-04-02 NOTE — Telephone Encounter (Signed)
-----   Message from Tammi Sou, MD sent at 04/01/2016  5:11 PM EDT ----- Labs stable except potassium slightly low at 3.3.   I recommend she start taking a new daily dose of her KDUR: 3 tabs po qd.  I'll send new rx in for this dosing. Continue taking a whole lasix 40mg  tab once a day--this is now her new chronic dosing of this med. Remind her to elevate legs when she can--20 min at least once a day ABOVE the level of her head.  Also, focus on low sodium intake and wear compression stockings if she has them.  If she doesn't have them and she thinks she could put them on then I 'll write a rx for them.-thx

## 2016-04-02 NOTE — Telephone Encounter (Signed)
Called patient to give lab results. No answer. Left detailed message per DPR.

## 2016-04-02 NOTE — Patient Instructions (Addendum)
Medication Instructions:  Your physician has recommended you make the following change in your medication:  1.  INCREASE the Lasix to 40 mg taking 1 tablet twice a day ( in the morning and at 1 pm) X's 2 days then go back to 40 mg taking 1 tablet daily 2.  INCREASE the Potassium to 20 meq taking 2 tablet twice a day X's 2 days then go back to 2 in the morning and 1 in the evening   Labwork: 04/08/16:  BMET  Testing/Procedures: None ordered  Follow-Up: Your physician recommends that you schedule a follow-up appointment in:  04/08/16 WITH 1ST AVAILABLE EXTENDER   Any Other Special Instructions Will Be Listed Below (If Applicable).     If you need a refill on your cardiac medications before your next appointment, please call your pharmacy.

## 2016-04-02 NOTE — Progress Notes (Signed)
Cardiology Office Note   Date:  04/02/2016   ID:  SHEKINA FADLER, DOB 10/03/1946, MRN [id number]  PCP:  Tammi Sou, MD  Cardiologist:  Dr. Stanford Breed  Chief Complaint  Patient presents with  . Leg Swelling    SOB also      History of Present Illness: Linda [name] is a 70 y.o. female who presents for lower ext edema.  Had called over the weekend with ncrease of 5 lbs lasix increased.  Now present for eval.    She has a hx. of diastolic congestive heart failure. Cardiac catheterization February 2014 showed nonobstructive disease (most significant 50-60 RCA) and normal LV function; note of 4+ MR. Renal Dopplers May 2014 showed no stenosis. Nuclear study March 2016 showed ejection fraction 55-60% and no ischemia or infarction. Also with history of presumed orthostatic syncope. Previously followed by Dr Terrence Dupont. Echocardiogram repeated September 2016 and showed normal LV function, grade 1 diastolic dysfunction and trace tricuspid regurgitation. Admitted October 2016 with acute diastolic congestive heart failure. Patient was treated with diuretics with improvement. Since last seen, Patient has hx dyspnea which sounds to be chronic. She also has hx of chest tightness which is chronic.  Saw PCP yesterday and her anxiety level was increased, xanax refilled.   She does have lower ext edema.  Lower ext edema 2+, legs are tight, she has not changed her diet.  She tries to keep her legs elevated at home.  Wt here is up 2 lbs from 01/2016.     Past Medical History  Diagnosis Date  . HTN (hypertension)   . Depression   . Recurrent UTI     hx of hospitalization for pyelonephritis; started abx prophylaxis 06/2015  . Hay fever   . Mixed incontinence urge and stress   . Diverticular disease   . Insomnia   . Fibromyalgia     Patient states dx was around her late 30s but she had sx's for years prior to this.  . Syncope     Hypotensive; ED visit--Dr. Terrence Dupont did Cath--nonobstructive CAD,  EF 55-60%.  In retrospect, suspect pt rx med misuse/polypharmacy  . Idiopathic angio-edema-urticaria 72014    Angioedema component was very minimal  . Asthma     w/ asbestososis   . History of pneumonia     hospitalized 12/2011, 02/2013, and 07/2013 [facility]) for this  . Anginal pain ([place])     Nonobstructive CAD 2014; however, her cardiologist put her on a statin for this and NOT for hyperlipidemia per pt report.  . OSA on CPAP     prior to move to --had another sleep study 10/2015 w/pulm Dr. Camillo Flaming.  . H/O hiatal hernia   . Migraine syndrome     "not as often anymore; used to be ~ q wk" (07/12/2013)  . Tension headache, chronic   . DDD (degenerative disc disease)     lumbar and cervical.   . Osteoarthritis     "severe; progressing fast" (07/12/2013); multiple joints-not surgical candidate for TKR (03/2015)  . Chronic lower back pain   . Anxiety     panic attacks  . Nephrolithiasis     "passed all on my own or they are still in there" (07/12/2013)  . Pyelonephritis     "several times over the last 30 yr" (07/12/2013)  . Diastolic congestive heart failure ([place])   . COPD (chronic obstructive pulmonary disease) ([place])   . Pulmonary embolism ([place]) 07/2013    Dx at [facility] with very  small peripheral upper lobe pe 07/2013: pt took coumadin for about 8-9 mo  . Pleural plaque with presence of asbestos 07/22/2013  . BPPV (benign paroxysmal positional vertigo) 12/16/2012  . RBBB (right bundle branch block)   . Acute upper GI bleed 06/2014    while pt taking coumadin, plavix, and meloxicam---despite being told not to take coumadin.  . Iron deficiency anemia     Hematologist in [place], [place] did extensive w/u; no cause found; failed oral supplement;; gets fairly regular (q37m or so) IV iron infusions (Venofer -iron sucrose- 200mg  with procrit.  "for 14 yr I've been getting blood work q month & getting infusions prn" (07/12/2013).  Dr. Marin Olp locally, iron infusions done, EPO deficiency dx'd  . Pernicious anemia  08/24/2014    Past Surgical History  Procedure Laterality Date  . Appendectomy  1960  . Total abdominal hysterectomy  1974  . Tendon release  1996    Right forearm and hand  . Knee surgery  2005  . Heel spur surgery Left 2008  . Plantar fascia release Left 2008  . Axillary surgery Left 1978    Multiple "lump" in armpit per pt  . Coccyx removal  1972  . Cardiac catheterization  01/2013    nonobstructive CAD, EF 55-60%  . Transthoracic echocardiogram  01/2013; 04/2014;08/2015    2014--NORMAL.  2015--focal basal septal hypertrophy, EF 55-60%, grade I diast dysfxn, mild LAE.  08/2015 EF 55-60%, nl LV syst fxn, grade I DD, valves wnl  . Dilation and curettage of uterus  ? 1970's  . Eye surgery Left 2012-2013    "injections for ~ 1 yr; don't really know what for" (07/12/2013)  . Spirometry  04/25/14    In hosp for acute asthma/COPD flare: mixed obstructive and restrictive lung disease. The FEV1 is severely reduced at 45% predicted.  FEV1 signif decreased compared to prior spirometry 07/23/13.  [name] Kitchen Esophagogastroduodenoscopy N/A 07/19/2014    Gastritis found + in the setting of supratherapeutic INR, +plavix, + meloxicam.  . Left heart catheterization with coronary angiogram N/A 01/30/2013    Procedure: LEFT HEART CATHETERIZATION WITH CORONARY ANGIOGRAM;  Surgeon: Clent Demark, MD;  Location: [facility] CATH LAB;  Service: Cardiovascular;  Laterality: N/A;  . Cardiovascular stress test  02/22/15    Low risk myocard perf imaging; wall motion normal, normal EF     Current Outpatient Prescriptions  Medication Sig Dispense Refill  . albuterol (PROVENTIL HFA;VENTOLIN HFA) 108 (90 BASE) MCG/ACT inhaler Inhale 2 puffs into the lungs every 6 (six) hours as needed for wheezing or shortness of breath. 1 Inhaler 1  . ALPRAZolam (XANAX) 1 MG tablet Take 1 tablet (1 mg total) by mouth 3 (three) times daily as needed for anxiety. 90 tablet 5  . ARIPiprazole (ABILIFY) 10 MG tablet Take 1 tablet (10 mg total) by mouth daily.  30 tablet 2  . aspirin 81 MG EC tablet Take 1 tablet (81 mg total) by mouth daily. 30 tablet   . budesonide-formoterol (SYMBICORT) 160-4.5 MCG/ACT inhaler Inhale 2 puffs into the lungs 2 (two) times daily. 1 Inhaler 12  . BYSTOLIC 5 MG tablet TAKE 1 TABLET ONCE DAILY. 30 tablet 12  . Cholecalciferol (VITAMIN D3) 50000 units CAPS Take 1 capsule by mouth every 30 (thirty) days. One Monday per month    . diclofenac sodium (VOLTAREN) 1 % GEL Apply 4 g topically 3 (three) times daily.    . DULoxetine (CYMBALTA) 30 MG capsule TAKE 1 CAPSULE ONCE DAILY ALONG WITH 60MG  CAPSULE  FOR TOTAL 90MG  DAILY. 30 capsule 11  . DULoxetine (CYMBALTA) 60 MG capsule Take 60 mg by mouth daily. to be combined with a 30 mg duloxetine cap daily    . folic acid (FOLVITE) 1 MG tablet Take 2 tablets (2 mg total) by mouth daily. 60 tablet 6  . furosemide (LASIX) 40 MG tablet TAKE 40 MG TWICE A DAY (IN THE A.M. AND AT 1:00 PM) X'S 2 DAYS THEN GO BACK TO 1 TABLET DAILY 60 tablet 5  . isosorbide mononitrate (IMDUR) 30 MG 24 hr tablet TAKE 1 TABLET ONCE DAILY. 30 tablet 6  . LYRICA 300 MG capsule TAKE (1) CAPSULE TWICE DAILY. 60 capsule 0  . Misc Natural Products (OSTEO BI-FLEX ADV JOINT SHIELD) TABS Take 1 tablet by mouth daily.    . Multiple Vitamin (MULTIVITAMIN WITH MINERALS) TABS tablet Take 1 tablet by mouth daily.    . nitroGLYCERIN (NITROSTAT) 0.4 MG SL tablet Place 0.4 mg under the tongue every 5 (five) minutes as needed for chest pain.    [name] Kitchen ondansetron (ZOFRAN-ODT) 4 MG disintegrating tablet Take 4 mg by mouth every 8 (eight) hours as needed for nausea or vomiting.    [name] Kitchen oxyCODONE-acetaminophen (PERCOCET) 10-325 MG tablet Take 1 tablet by mouth every 8 (eight) hours as needed for pain. ONE MONTH SUPPLY 100 tablet 0  . OXYGEN Inhale 3 L into the lungs continuous.    . pantoprazole (PROTONIX) 40 MG tablet Take 1 tablet (40 mg total) by mouth daily. 90 tablet 1  . Polyethyl Glycol-Propyl Glycol (SYSTANE) 0.4-0.3 % SOLN Place 1  drop into both eyes daily.    . polyethylene glycol (MIRALAX / GLYCOLAX) packet Take 17 g by mouth daily.    . potassium chloride SA (K-DUR,KLOR-CON) 20 MEQ tablet Take 2 by mouth twice a day X's 2 days then take 2 tablets by mouth in the morning and 1 tablet in the evening 120 tablet 5  . traZODone (DESYREL) 50 MG tablet 2-4 tabs po qhs prn insomnia 120 tablet 6   No current facility-administered medications for this visit.   Facility-Administered Medications Ordered in Other Visits  Medication Dose Route Frequency Provider Last Rate Last Dose  . ferumoxytol (FERAHEME) 510 mg in sodium chloride 0.9 % 100 mL IVPB  510 mg Intravenous Once Volanda Napoleon, MD        Allergies:   Penicillins    Social History:  The patient  reports that she has never smoked. She has never used smokeless tobacco. She reports that she does not drink alcohol or use illicit drugs.   Family History:  The patient's family history includes Arthritis in her mother; Diabetes in her father; Heart disease in her father; Hypertension in her father; Kidney disease in her mother; Stroke in her father.    ROS:  General:no colds or fevers, no weight changes Skin:no rashes or ulcers HEENT:no blurred vision, no congestion CV:see HPI PUL:see HPI GI:no diarrhea constipation or melena, no indigestion GU:no hematuria, no dysuria  MS:no joint pain, no claudication Neuro:no syncope, no lightheadedness Endo:no diabetes, no thyroid disease  Wt Readings from Last 3 Encounters:  04/02/16 176 lb 9.6 oz (80.105 kg)  04/01/16 175 lb 12 oz (79.72 kg)  02/02/16 174 lb 12 oz (79.266 kg)     PHYSICAL EXAM: VS:  BP 138/84 mmHg  Pulse 71  Ht 5\' 4"  (1.626 m)  Wt 176 lb 9.6 oz (80.105 kg)  BMI 30.30 kg/m2  SpO2 97% , BMI Body mass index  is 30.3 kg/(m^2). General:Pleasant affect, NAD Skin:Warm and dry, brisk capillary refill HEENT:normocephalic, sclera clear, mucus membranes moist Neck:supple, no JVD, no bruits  Heart:S1S2 RRR  without murmur, gallup, rub or click Lungs:clear without rales, rhonchi, or wheezes [id number], non tender, + BS, do not palpate liver spleen or masses Ext:+2 lower ext edema, 2+ radial pulses Neuro:alert and oriented X 3, MAE, follows commands, + facial symmetry    EKG:  EKG is ordered today. The ekg ordered today demonstrates RBBB no changes.     Recent Labs: 08/24/2015: Pro B Natriuretic peptide (BNP) 92.0 10/16/2015: B Natriuretic Peptide 15.4 01/02/2016: TSH 0.539 01/03/2016: ALT 12*; Magnesium 2.0 01/08/2016: Hemoglobin 13.1; Platelets 215.0 04/01/2016: BUN 13; Creatinine, Ser 0.73; Potassium 3.3*; Sodium 141    Lipid Panel    Component Value Date/Time   CHOL 152 01/31/2013 0500   TRIG 92 01/31/2013 0500   HDL 44 01/31/2013 0500   CHOLHDL 3.5 01/31/2013 0500   VLDL 18 01/31/2013 0500   LDLCALC 90 01/31/2013 0500       Other studies Reviewed: Additional studies/ records that were reviewed today include: see above..   ASSESSMENT AND PLAN:  1.  Edema increased Lasix over the weekend to 40 daily for 3 days.  Today 2+ edema, will increase lasix to 80 mg for 3 days then back to 40 mg daily.  We discussed repeating her echo but will hold off and see if meds work.  Follow up next week.  If no improvement possible need for IV diuretics.  Will also check BNP on Monday.  2. Hypokalemia KDUr increased to 3 tabs daily.  With lasix to 80 she will take 2- 20 meq tabs for 2 days then resume 3 tabs daily.  BMP on Monday.   3. Chronic diastolic HF.  Class now 2.    4.  CAD non obstructive disease.  Continue asa and statin.   5. Hyperlipdemia.  On statin   Current medicines are reviewed with the patient today.  The patient Has no concerns regarding medicines.  The following changes have been made:  See above Labs/ tests ordered today include:see above  Disposition:   FU:  see above  Lennie Muckle, NP  04/02/2016 10:39 PM    [facility] [place], [place], [address], [place] Phone: [phone]; Fax: [phone]

## 2016-04-02 NOTE — Telephone Encounter (Signed)
Pt advised and voiced understanding.   

## 2016-04-03 DIAGNOSIS — M6281 Muscle weakness (generalized): Secondary | ICD-10-CM | POA: Diagnosis not present

## 2016-04-03 DIAGNOSIS — M542 Cervicalgia: Secondary | ICD-10-CM | POA: Diagnosis not present

## 2016-04-03 DIAGNOSIS — Z9181 History of falling: Secondary | ICD-10-CM | POA: Diagnosis not present

## 2016-04-03 DIAGNOSIS — R2681 Unsteadiness on feet: Secondary | ICD-10-CM | POA: Diagnosis not present

## 2016-04-03 DIAGNOSIS — M25511 Pain in right shoulder: Secondary | ICD-10-CM | POA: Diagnosis not present

## 2016-04-04 DIAGNOSIS — M25511 Pain in right shoulder: Secondary | ICD-10-CM | POA: Diagnosis not present

## 2016-04-05 DIAGNOSIS — M25511 Pain in right shoulder: Secondary | ICD-10-CM | POA: Diagnosis not present

## 2016-04-05 DIAGNOSIS — Z9181 History of falling: Secondary | ICD-10-CM | POA: Diagnosis not present

## 2016-04-05 DIAGNOSIS — M542 Cervicalgia: Secondary | ICD-10-CM | POA: Diagnosis not present

## 2016-04-05 DIAGNOSIS — R2681 Unsteadiness on feet: Secondary | ICD-10-CM | POA: Diagnosis not present

## 2016-04-05 DIAGNOSIS — M6281 Muscle weakness (generalized): Secondary | ICD-10-CM | POA: Diagnosis not present

## 2016-04-08 ENCOUNTER — Encounter: Payer: Self-pay | Admitting: Cardiology

## 2016-04-08 ENCOUNTER — Ambulatory Visit (INDEPENDENT_AMBULATORY_CARE_PROVIDER_SITE_OTHER): Payer: Medicare Other | Admitting: Cardiology

## 2016-04-08 ENCOUNTER — Other Ambulatory Visit (INDEPENDENT_AMBULATORY_CARE_PROVIDER_SITE_OTHER): Payer: Medicare Other

## 2016-04-08 ENCOUNTER — Inpatient Hospital Stay (HOSPITAL_COMMUNITY)
Admission: AD | Admit: 2016-04-08 | Discharge: 2016-04-11 | DRG: 293 | Disposition: A | Discharge status: Home / Self Care | Payer: Medicare Other | Source: Ambulatory Visit | Attending: Cardiovascular Disease | Admitting: Cardiovascular Disease

## 2016-04-08 VITALS — BP 115/68 | HR 78 | Ht 64.0 in | Wt 175.6 lb

## 2016-04-08 DIAGNOSIS — I5031 Acute diastolic (congestive) heart failure: Secondary | ICD-10-CM | POA: Diagnosis not present

## 2016-04-08 DIAGNOSIS — R0602 Shortness of breath: Secondary | ICD-10-CM | POA: Insufficient documentation

## 2016-04-08 DIAGNOSIS — M797 Fibromyalgia: Secondary | ICD-10-CM | POA: Diagnosis present

## 2016-04-08 DIAGNOSIS — I5032 Chronic diastolic (congestive) heart failure: Secondary | ICD-10-CM

## 2016-04-08 DIAGNOSIS — F329 Major depressive disorder, single episode, unspecified: Secondary | ICD-10-CM | POA: Diagnosis present

## 2016-04-08 DIAGNOSIS — R600 Localized edema: Secondary | ICD-10-CM

## 2016-04-08 DIAGNOSIS — I10 Essential (primary) hypertension: Secondary | ICD-10-CM | POA: Diagnosis present

## 2016-04-08 DIAGNOSIS — R0600 Dyspnea, unspecified: Secondary | ICD-10-CM

## 2016-04-08 DIAGNOSIS — I2583 Coronary atherosclerosis due to lipid rich plaque: Secondary | ICD-10-CM

## 2016-04-08 DIAGNOSIS — E785 Hyperlipidemia, unspecified: Secondary | ICD-10-CM | POA: Diagnosis not present

## 2016-04-08 DIAGNOSIS — Z7982 Long term (current) use of aspirin: Secondary | ICD-10-CM | POA: Diagnosis not present

## 2016-04-08 DIAGNOSIS — I5033 Acute on chronic diastolic (congestive) heart failure: Secondary | ICD-10-CM | POA: Diagnosis not present

## 2016-04-08 DIAGNOSIS — Z90710 Acquired absence of both cervix and uterus: Secondary | ICD-10-CM

## 2016-04-08 DIAGNOSIS — M170 Bilateral primary osteoarthritis of knee: Secondary | ICD-10-CM | POA: Diagnosis present

## 2016-04-08 DIAGNOSIS — Z880 Allergy status to penicillin: Secondary | ICD-10-CM | POA: Diagnosis not present

## 2016-04-08 DIAGNOSIS — Z79899 Other long term (current) drug therapy: Secondary | ICD-10-CM

## 2016-04-08 DIAGNOSIS — G4733 Obstructive sleep apnea (adult) (pediatric): Secondary | ICD-10-CM | POA: Diagnosis present

## 2016-04-08 DIAGNOSIS — G894 Chronic pain syndrome: Secondary | ICD-10-CM | POA: Diagnosis present

## 2016-04-08 DIAGNOSIS — I110 Hypertensive heart disease with heart failure: Principal | ICD-10-CM | POA: Diagnosis present

## 2016-04-08 DIAGNOSIS — R079 Chest pain, unspecified: Secondary | ICD-10-CM

## 2016-04-08 DIAGNOSIS — Z7951 Long term (current) use of inhaled steroids: Secondary | ICD-10-CM | POA: Diagnosis not present

## 2016-04-08 DIAGNOSIS — E876 Hypokalemia: Secondary | ICD-10-CM | POA: Diagnosis present

## 2016-04-08 DIAGNOSIS — J61 Pneumoconiosis due to asbestos and other mineral fibers: Secondary | ICD-10-CM | POA: Diagnosis present

## 2016-04-08 DIAGNOSIS — R300 Dysuria: Secondary | ICD-10-CM | POA: Diagnosis not present

## 2016-04-08 DIAGNOSIS — Z86711 Personal history of pulmonary embolism: Secondary | ICD-10-CM

## 2016-04-08 DIAGNOSIS — I2510 Atherosclerotic heart disease of native coronary artery without angina pectoris: Secondary | ICD-10-CM | POA: Diagnosis present

## 2016-04-08 DIAGNOSIS — Z9981 Dependence on supplemental oxygen: Secondary | ICD-10-CM | POA: Diagnosis not present

## 2016-04-08 DIAGNOSIS — J449 Chronic obstructive pulmonary disease, unspecified: Secondary | ICD-10-CM | POA: Diagnosis present

## 2016-04-08 DIAGNOSIS — F419 Anxiety disorder, unspecified: Secondary | ICD-10-CM | POA: Diagnosis present

## 2016-04-08 DIAGNOSIS — G8929 Other chronic pain: Secondary | ICD-10-CM | POA: Diagnosis present

## 2016-04-08 LAB — CBC WITH DIFFERENTIAL/PLATELET
BASOS PCT: 0 %
Basophils Absolute: 0 10*3/uL (ref 0.0–0.1)
EOS PCT: 2 %
Eosinophils Absolute: 0.1 10*3/uL (ref 0.0–0.7)
HEMATOCRIT: 42.5 % (ref 36.0–46.0)
Hemoglobin: 13.9 g/dL (ref 12.0–15.0)
LYMPHS ABS: 2.2 10*3/uL (ref 0.7–4.0)
Lymphocytes Relative: 36 %
MCH: 29.3 pg (ref 26.0–34.0)
MCHC: 32.7 g/dL (ref 30.0–36.0)
MCV: 89.5 fL (ref 78.0–100.0)
MONO ABS: 0.5 10*3/uL (ref 0.1–1.0)
Monocytes Relative: 9 %
NEUTROS PCT: 53 %
Neutro Abs: 3.3 10*3/uL (ref 1.7–7.7)
PLATELETS: 134 10*3/uL — AB (ref 150–400)
RBC: 4.75 MIL/uL (ref 3.87–5.11)
RDW: 13.1 % (ref 11.5–15.5)
WBC: 6.1 10*3/uL (ref 4.0–10.5)

## 2016-04-08 LAB — COMPREHENSIVE METABOLIC PANEL
ALT: 13 U/L — AB (ref 14–54)
AST: 18 U/L (ref 15–41)
Albumin: 3.7 g/dL (ref 3.5–5.0)
Alkaline Phosphatase: 73 U/L (ref 38–126)
Anion gap: 10 (ref 5–15)
BILIRUBIN TOTAL: 1 mg/dL (ref 0.3–1.2)
BUN: 11 mg/dL (ref 6–20)
CALCIUM: 9.5 mg/dL (ref 8.9–10.3)
CHLORIDE: 101 mmol/L (ref 101–111)
CO2: 31 mmol/L (ref 22–32)
CREATININE: 0.94 mg/dL (ref 0.44–1.00)
Glucose, Bld: 91 mg/dL (ref 65–99)
Potassium: 4.1 mmol/L (ref 3.5–5.1)
Sodium: 142 mmol/L (ref 135–145)
TOTAL PROTEIN: 6.8 g/dL (ref 6.5–8.1)

## 2016-04-08 LAB — BRAIN NATRIURETIC PEPTIDE: B Natriuretic Peptide: 30.2 pg/mL (ref 0.0–100.0)

## 2016-04-08 LAB — TROPONIN I: Troponin I: <0.03 ng/mL (ref <0.031)

## 2016-04-08 LAB — TSH: TSH: 1.002 u[IU]/mL (ref 0.350–4.500)

## 2016-04-08 LAB — PROTIME-INR
INR: 1.05 (ref 0.00–1.49)
PROTHROMBIN TIME: 13.9 s (ref 11.6–15.2)

## 2016-04-08 LAB — MAGNESIUM: Magnesium: 2 mg/dL (ref 1.7–2.4)

## 2016-04-08 MED ORDER — VITAMIN D3 1.25 MG (50000 UT) PO CAPS: 1.0000 | ORAL_CAPSULE | ORAL | Status: DC

## 2016-04-08 MED ORDER — NITROGLYCERIN 0.4 MG SL SUBL: 0.4000 mg | SUBLINGUAL_TABLET | SUBLINGUAL | Status: DC | PRN

## 2016-04-08 MED ORDER — DULOXETINE HCL 30 MG PO CPEP: 30.0000 mg | ORAL_CAPSULE | Freq: Every day | ORAL | Status: DC

## 2016-04-08 MED ORDER — POTASSIUM CHLORIDE CRYS ER 20 MEQ PO TBCR
20.0000 meq | EXTENDED_RELEASE_TABLET | Freq: Every day | ORAL | Status: DC
  Administered 2016-04-08 – 2016-04-10 (×3): 20 meq via ORAL
  Filled 2016-04-08 (×3): qty 1

## 2016-04-08 MED ORDER — OXYCODONE-ACETAMINOPHEN 5-325 MG PO TABS
1.0000 | ORAL_TABLET | Freq: Three times a day (TID) | ORAL | Status: DC | PRN
  Administered 2016-04-08 – 2016-04-10 (×4): 1 via ORAL
  Filled 2016-04-08 (×8): qty 1

## 2016-04-08 MED ORDER — SODIUM CHLORIDE 0.9% FLUSH: 3.0000 mL | INTRAVENOUS | Status: DC | PRN

## 2016-04-08 MED ORDER — POLYETHYL GLYCOL-PROPYL GLYCOL 0.4-0.3 % OP SOLN: 1.0000 [drp] | Freq: Every day | OPHTHALMIC | Status: DC

## 2016-04-08 MED ORDER — ISOSORBIDE MONONITRATE ER 30 MG PO TB24
30.0000 mg | ORAL_TABLET | Freq: Every day | ORAL | Status: DC
  Administered 2016-04-09 – 2016-04-11 (×3): 30 mg via ORAL
  Filled 2016-04-08 (×3): qty 1

## 2016-04-08 MED ORDER — PANTOPRAZOLE SODIUM 40 MG PO TBEC
40.0000 mg | DELAYED_RELEASE_TABLET | Freq: Every day | ORAL | Status: DC
  Administered 2016-04-09 – 2016-04-11 (×3): 40 mg via ORAL
  Filled 2016-04-08 (×3): qty 1

## 2016-04-08 MED ORDER — ONDANSETRON 4 MG PO TBDP
4.0000 mg | ORAL_TABLET | Freq: Three times a day (TID) | ORAL | Status: DC | PRN
  Filled 2016-04-08: qty 1

## 2016-04-08 MED ORDER — ARIPIPRAZOLE 5 MG PO TABS
10.0000 mg | ORAL_TABLET | Freq: Every day | ORAL | Status: DC
  Administered 2016-04-09 – 2016-04-11 (×3): 10 mg via ORAL
  Filled 2016-04-08 (×3): qty 2

## 2016-04-08 MED ORDER — PREGABALIN 100 MG PO CAPS
300.0000 mg | ORAL_CAPSULE | Freq: Two times a day (BID) | ORAL | Status: DC
  Administered 2016-04-08 – 2016-04-11 (×6): 300 mg via ORAL
  Filled 2016-04-08 (×6): qty 3

## 2016-04-08 MED ORDER — ALPRAZOLAM 0.5 MG PO TABS
1.0000 mg | ORAL_TABLET | Freq: Three times a day (TID) | ORAL | Status: DC | PRN
  Administered 2016-04-08 – 2016-04-11 (×7): 1 mg via ORAL
  Filled 2016-04-08 (×7): qty 2

## 2016-04-08 MED ORDER — ADULT MULTIVITAMIN W/MINERALS CH
1.0000 | ORAL_TABLET | Freq: Every day | ORAL | Status: DC
  Administered 2016-04-09 – 2016-04-11 (×3): 1 via ORAL
  Filled 2016-04-08 (×3): qty 1

## 2016-04-08 MED ORDER — POLYETHYLENE GLYCOL 3350 17 G PO PACK: 17.0000 g | PACK | Freq: Every day | ORAL | Status: DC

## 2016-04-08 MED ORDER — ACETAMINOPHEN 325 MG PO TABS: 650.0000 mg | ORAL_TABLET | ORAL | Status: DC | PRN

## 2016-04-08 MED ORDER — ASPIRIN EC 81 MG PO TBEC
81.0000 mg | DELAYED_RELEASE_TABLET | Freq: Every day | ORAL | Status: DC
Start: 2016-04-08 — End: 2016-04-11
  Administered 2016-04-08 – 2016-04-11 (×4): 81 mg via ORAL
  Filled 2016-04-08 (×4): qty 1

## 2016-04-08 MED ORDER — ALBUTEROL SULFATE (2.5 MG/3ML) 0.083% IN NEBU: 5.0000 mg | INHALATION_SOLUTION | Freq: Four times a day (QID) | RESPIRATORY_TRACT | Status: DC | PRN

## 2016-04-08 MED ORDER — POTASSIUM CHLORIDE CRYS ER 20 MEQ PO TBCR
40.0000 meq | EXTENDED_RELEASE_TABLET | Freq: Every morning | ORAL | Status: DC
  Administered 2016-04-09 – 2016-04-11 (×3): 40 meq via ORAL
  Filled 2016-04-08 (×3): qty 2

## 2016-04-08 MED ORDER — MOMETASONE FURO-FORMOTEROL FUM 200-5 MCG/ACT IN AERO
2.0000 | INHALATION_SPRAY | Freq: Two times a day (BID) | RESPIRATORY_TRACT | Status: DC
  Administered 2016-04-08 – 2016-04-11 (×5): 2 via RESPIRATORY_TRACT
  Filled 2016-04-08 (×2): qty 8.8

## 2016-04-08 MED ORDER — POTASSIUM CHLORIDE CRYS ER 20 MEQ PO TBCR: 20.0000 meq | EXTENDED_RELEASE_TABLET | ORAL | Status: DC

## 2016-04-08 MED ORDER — SODIUM CHLORIDE 0.9 % IV SOLN: 250.0000 mL | INTRAVENOUS | Status: DC | PRN

## 2016-04-08 MED ORDER — ASPIRIN EC 81 MG PO TBEC: 81.0000 mg | DELAYED_RELEASE_TABLET | Freq: Every day | ORAL | Status: DC

## 2016-04-08 MED ORDER — HEPARIN SODIUM (PORCINE) 5000 UNIT/ML IJ SOLN
5000.0000 [IU] | Freq: Three times a day (TID) | INTRAMUSCULAR | Status: DC
  Administered 2016-04-08 – 2016-04-11 (×9): 5000 [IU] via SUBCUTANEOUS
  Filled 2016-04-08 (×9): qty 1

## 2016-04-08 MED ORDER — FOLIC ACID 1 MG PO TABS
2.0000 mg | ORAL_TABLET | Freq: Every day | ORAL | Status: DC
  Administered 2016-04-09 – 2016-04-11 (×3): 2 mg via ORAL
  Filled 2016-04-08 (×3): qty 2

## 2016-04-08 MED ORDER — DULOXETINE HCL 60 MG PO CPEP
90.0000 mg | ORAL_CAPSULE | Freq: Every day | ORAL | Status: DC
  Administered 2016-04-08 – 2016-04-10 (×3): 90 mg via ORAL
  Filled 2016-04-08 (×3): qty 1

## 2016-04-08 MED ORDER — NEBIVOLOL HCL 5 MG PO TABS
5.0000 mg | ORAL_TABLET | Freq: Every day | ORAL | Status: DC
  Administered 2016-04-09 – 2016-04-11 (×3): 5 mg via ORAL
  Filled 2016-04-08 (×3): qty 1

## 2016-04-08 MED ORDER — POLYVINYL ALCOHOL 1.4 % OP SOLN
1.0000 [drp] | Freq: Every day | OPHTHALMIC | Status: DC
  Administered 2016-04-08 – 2016-04-11 (×4): 1 [drp] via OPHTHALMIC
  Filled 2016-04-08: qty 15

## 2016-04-08 MED ORDER — ALBUTEROL SULFATE (2.5 MG/3ML) 0.083% IN NEBU: 2.5000 mg | INHALATION_SOLUTION | Freq: Four times a day (QID) | RESPIRATORY_TRACT | Status: DC | PRN

## 2016-04-08 MED ORDER — ONDANSETRON HCL 4 MG/2ML IJ SOLN: 4.0000 mg | Freq: Four times a day (QID) | INTRAMUSCULAR | Status: DC | PRN

## 2016-04-08 MED ORDER — FUROSEMIDE 10 MG/ML IJ SOLN
60.0000 mg | Freq: Two times a day (BID) | INTRAMUSCULAR | Status: DC
  Administered 2016-04-08 – 2016-04-09 (×2): 60 mg via INTRAVENOUS
  Filled 2016-04-08 (×2): qty 6

## 2016-04-08 MED ORDER — TRAZODONE HCL 100 MG PO TABS
100.0000 mg | ORAL_TABLET | Freq: Every evening | ORAL | Status: DC | PRN
  Administered 2016-04-09: 100 mg via ORAL
  Filled 2016-04-08: qty 1

## 2016-04-08 MED ORDER — SODIUM CHLORIDE 0.9% FLUSH
3.0000 mL | Freq: Two times a day (BID) | INTRAVENOUS | Status: DC
Start: 2016-04-08 — End: 2016-04-11
  Administered 2016-04-08 – 2016-04-11 (×6): 3 mL via INTRAVENOUS

## 2016-04-08 MED ORDER — DULOXETINE HCL 60 MG PO CPEP: 60.0000 mg | ORAL_CAPSULE | Freq: Every day | ORAL | Status: DC

## 2016-04-08 NOTE — H&P (Signed)
Expand All Collapse All      HISTORY AND PHYSICAL  Date: 04/08/2016   ID: [name], DOB November 12, 1946, MRN [id number]  PCP: Tammi Sou, MD Cardiologist: Dr. Stanford Breed   Chief Complaint  Patient presents with  . Edema    f/u edema  . Chest Pain     History of Present Illness: Linda [name] is a 70 y.o. female who presented to office for increased edema. I talked to her on 03/30/16 for increased edema and had her increase her lasix. Saw her on the 10th and again increased lasix but she has only lost 1 pound.   She has a hx. Of diastolic congestive heart failure. Cardiac catheterization February 2014 showed nonobstructive disease (most significant 50-60 RCA) and normal LV function; note of 4+ MR. Renal Dopplers May 2014 showed no stenosis. Nuclear study March 2016 showed ejection fraction 96% and no ischemia or infarction. Also with history of presumed orthostatic syncope. Previously followed by Dr Terrence Dupont. Echocardiogram repeated September 2016 and showed normal LV function, grade 1 diastolic dysfunction and trace tricuspid regurgitation. No MR. Admitted October 2016 with acute diastolic congestive heart failure. Patient was treated with diuretics with improvement. Since last seen, Patient has dyspnea which sounds to be chronic per Dr. Jacalyn Lefevre note. She also has chest tightness which is chronic.   Today back for follow up she has only lost 1 pound and is very tired, With her chronic pain of her back.. Also with chest discomfort though this is chronic. But with known CAD.   Past Medical History  Diagnosis Date  . HTN (hypertension)   . Depression   . Recurrent UTI     hx of hospitalization for pyelonephritis; started abx prophylaxis 06/2015  . Hay fever   . Mixed incontinence urge and stress   . Diverticular disease   . Insomnia   . Fibromyalgia     Patient states dx was around her late 44s but she  had sx's for years prior to this.  . Syncope     Hypotensive; ED visit--Dr. Terrence Dupont did Cath--nonobstructive CAD, EF 55-60%. In retrospect, suspect pt rx med misuse/polypharmacy  . Idiopathic angio-edema-urticaria 72014    Angioedema component was very minimal  . Asthma     w/ asbestososis   . History of pneumonia     hospitalized 12/2011, 02/2013, and 07/2013 [facility]) for this  . Anginal pain ([place])     Nonobstructive CAD 2014; however, her cardiologist put her on a statin for this and NOT for hyperlipidemia per pt report.  . OSA on CPAP     prior to move to [place]--had another sleep study 10/2015 w/pulm Dr. Camillo Flaming.  . H/O hiatal hernia   . Migraine syndrome     "not as often anymore; used to be ~ q wk" (07/12/2013)  . Tension headache, chronic   . DDD (degenerative disc disease)     lumbar and cervical.   . Osteoarthritis     "severe; progressing fast" (07/12/2013); multiple joints-not surgical candidate for TKR (03/2015)  . Chronic lower back pain   . Anxiety     panic attacks  . Nephrolithiasis     "passed all on my own or they are still in there" (07/12/2013)  . Pyelonephritis     "several times over the last 30 yr" (07/12/2013)  . Diastolic congestive heart failure ([place])   . COPD (chronic obstructive pulmonary disease) ([place])   . Pulmonary embolism ([place]) 07/2013    Dx at [facility] with  very small peripheral upper lobe pe 07/2013: pt took coumadin for about 8-9 mo  . Pleural plaque with presence of asbestos 07/22/2013  . BPPV (benign paroxysmal positional vertigo) 12/16/2012  . RBBB (right bundle branch block)   . Acute upper GI bleed 06/2014    while pt taking coumadin, plavix, and meloxicam---despite being told not to take coumadin.  . Iron deficiency anemia     Hematologist in [place], [place] did extensive w/u; no cause found; failed oral supplement;; gets fairly regular (q61m or so) IV  iron infusions (Venofer -iron sucrose- 200mg  with procrit. "for 14 yr I've been getting blood work q month & getting infusions prn" (07/12/2013). Dr. Marin Olp locally, iron infusions done, EPO deficiency dx'd  . Pernicious anemia 08/24/2014    Past Surgical History  Procedure Laterality Date  . Appendectomy  1960  . Total abdominal hysterectomy  1974  . Tendon release  1996    Right forearm and hand  . Knee surgery  2005  . Heel spur surgery Left 2008  . Plantar fascia release Left 2008  . Axillary surgery Left 1978    Multiple "lump" in armpit per pt  . Coccyx removal  1972  . Cardiac catheterization  01/2013    nonobstructive CAD, EF 55-60%  . Transthoracic echocardiogram  01/2013; 04/2014;08/2015    2014--NORMAL. 2015--focal basal septal hypertrophy, EF 55-60%, grade I diast dysfxn, mild LAE. 08/2015 EF 55-60%, nl LV syst fxn, grade I DD, valves wnl  . Dilation and curettage of uterus  ? 1970's  . Eye surgery Left 2012-2013    "injections for ~ 1 yr; don't really know what for" (07/12/2013)  . Spirometry  04/25/14    In hosp for acute asthma/COPD flare: mixed obstructive and restrictive lung disease. The FEV1 is severely reduced at 45% predicted. FEV1 signif decreased compared to prior spirometry 07/23/13.  [name] Kitchen Esophagogastroduodenoscopy N/A 07/19/2014    Gastritis found + in the setting of supratherapeutic INR, +plavix, + meloxicam.  . Left heart catheterization with coronary angiogram N/A 01/30/2013    Procedure: LEFT HEART CATHETERIZATION WITH CORONARY ANGIOGRAM; Surgeon: Clent Demark, MD; Location: [facility] CATH LAB; Service: Cardiovascular; Laterality: N/A;  . Cardiovascular stress test  02/22/15    Low risk myocard perf imaging; wall motion normal, normal EF     Current Outpatient Prescriptions  Medication Sig Dispense Refill  . albuterol (PROVENTIL HFA;VENTOLIN HFA) 108 (90  BASE) MCG/ACT inhaler Inhale 2 puffs into the lungs every 6 (six) hours as needed for wheezing or shortness of breath. 1 Inhaler 1  . ALPRAZolam (XANAX) 1 MG tablet Take 1 tablet (1 mg total) by mouth 3 (three) times daily as needed for anxiety. 90 tablet 5  . ARIPiprazole (ABILIFY) 10 MG tablet Take 1 tablet (10 mg total) by mouth daily. 30 tablet 2  . aspirin 81 MG EC tablet Take 1 tablet (81 mg total) by mouth daily. 30 tablet   . budesonide-formoterol (SYMBICORT) 160-4.5 MCG/ACT inhaler Inhale 2 puffs into the lungs 2 (two) times daily. 1 Inhaler 12  . BYSTOLIC 5 MG tablet TAKE 1 TABLET ONCE DAILY. 30 tablet 12  . Cholecalciferol (VITAMIN D3) 50000 units CAPS Take 1 capsule by mouth every 30 (thirty) days. One Monday per month    . diclofenac sodium (VOLTAREN) 1 % GEL Apply 4 g topically 3 (three) times daily.    . DULoxetine (CYMBALTA) 30 MG capsule TAKE 1 CAPSULE ONCE DAILY ALONG WITH 60MG  CAPSULE FOR TOTAL 90MG  DAILY. 30 capsule 11  .  DULoxetine (CYMBALTA) 60 MG capsule Take 60 mg by mouth daily. to be combined with a 30 mg duloxetine cap daily    . folic acid (FOLVITE) 1 MG tablet Take 2 tablets (2 mg total) by mouth daily. 60 tablet 6  . furosemide (LASIX) 40 MG tablet Take 40 mg by mouth daily.    . isosorbide mononitrate (IMDUR) 30 MG 24 hr tablet TAKE 1 TABLET ONCE DAILY. 30 tablet 6  . LYRICA 300 MG capsule TAKE (1) CAPSULE TWICE DAILY. 60 capsule 0  . Misc Natural Products (OSTEO BI-FLEX ADV JOINT SHIELD) TABS Take 1 tablet by mouth daily.    . Multiple Vitamin (MULTIVITAMIN WITH MINERALS) TABS tablet Take 1 tablet by mouth daily.    . nitroGLYCERIN (NITROSTAT) 0.4 MG SL tablet Place 0.4 mg under the tongue every 5 (five) minutes as needed for chest pain (x 3 doses).     . ondansetron (ZOFRAN-ODT) 4 MG disintegrating tablet Take 4 mg by mouth every 8 (eight) hours as needed for nausea or vomiting.     [name] Kitchen oxyCODONE-acetaminophen (PERCOCET) 10-325 MG tablet Take 1 tablet by mouth every 8 (eight) hours as needed for pain. ONE MONTH SUPPLY 100 tablet 0  . OXYGEN Inhale 3 L into the lungs continuous.    . pantoprazole (PROTONIX) 40 MG tablet Take 1 tablet (40 mg total) by mouth daily. 90 tablet 1  . Polyethyl Glycol-Propyl Glycol (SYSTANE) 0.4-0.3 % SOLN Place 1 drop into both eyes daily.    . polyethylene glycol (MIRALAX / GLYCOLAX) packet Take 17 g by mouth daily.    . potassium chloride SA (K-DUR,KLOR-CON) 20 MEQ tablet Take 20 mEq by mouth as directed. Take 2 tabs in the morning and one tablet in the evening    . traZODone (DESYREL) 50 MG tablet 2-4 tabs po qhs prn insomnia 120 tablet 6   No current facility-administered medications for this visit.   Facility-Administered Medications Ordered in Other Visits  Medication Dose Route Frequency Provider Last Rate Last Dose  . ferumoxytol (FERAHEME) 510 mg in sodium chloride 0.9 % 100 mL IVPB 510 mg Intravenous Once Volanda Napoleon, MD      Allergies: Penicillins    Social History: The patient  reports that she has never smoked. She has never used smokeless tobacco. She reports that she does not drink alcohol or use illicit drugs.   Family History: The patient's family history includes Arthritis in her mother; Diabetes in her father; Heart disease in her father; Hypertension in her father; Kidney disease in her mother; Stroke in her father.    ROS: General:no colds or fevers, one pound weight loss Skin:no rashes or ulcers HEENT:no blurred vision, no congestion CV:see HPI PUL:see HPI GI:no diarrhea constipation or melena, no indigestion GU:no hematuria, no dysuria MS:no joint pain, no claudication, chronic back pain Neuro:no syncope, no lightheadedness Endo:no diabetes, no thyroid disease  Wt Readings from Last 3 Encounters:  04/08/16 175 lb 9.6 oz (79.652 kg)    04/02/16 176 lb 9.6 oz (80.105 kg)  04/01/16 175 lb 12 oz (79.72 kg)     PHYSICAL EXAM: VS: BP 115/68 mmHg  Pulse 78  Ht 5\' 4"  (1.626 m)  Wt 175 lb 9.6 oz (79.652 kg)  BMI 30.13 kg/m2  SpO2 92% , BMI Body mass index is 30.13 kg/(m^2). General:Pleasant affect, though flat NAD Skin:Warm and dry, brisk capillary refill HEENT:normocephalic, sclera clear, mucus membranes moist Neck:supple, no JVD, no bruits sitting up Heart:S1S2 RRR without murmur, gallup, rub or  click Lungs:diminished without rales, rhonchi, occ wheezes [id number], non tender, + BS, do not palpate liver spleen or masses Ext:2+ to knees lower ext edema, 2+ radial pulses Neuro:alert and oriented X 3, MAE, follows commands, + facial symmetry    EKG: EKG is ordered today. The ekg ordered today demonstrates SR with RBBB no acute changes.    Recent Labs: 08/24/2015: Pro B Natriuretic peptide (BNP) 92.0 10/16/2015: B Natriuretic Peptide 15.4 01/02/2016: TSH 0.539 01/03/2016: ALT 12*; Magnesium 2.0 01/08/2016: Hemoglobin 13.1; Platelets 215.0 04/01/2016: BUN 13; Creatinine, Ser 0.73; Potassium 3.3*; Sodium 141    Lipid Panel  Labs (Brief)       Component Value Date/Time   CHOL 152 01/31/2013 0500   TRIG 92 01/31/2013 0500   HDL 44 01/31/2013 0500   CHOLHDL 3.5 01/31/2013 0500   VLDL 18 01/31/2013 0500   LDLCALC 90 01/31/2013 0500        Other studies Reviewed: Additional studies/ records that were reviewed today include: . ECHO 08/23/16 Study Conclusions  - Left ventricle: The cavity size was normal. Systolic function was  normal. The estimated ejection fraction was in the range of 60%  to 65%. Wall motion was normal; there were no regional wall  motion abnormalities. There was an increased relative  contribution of atrial contraction to ventricular filling.  Doppler parameters are consistent with abnormal left ventricular  relaxation (grade 1 diastolic  dysfunction). - Tricuspid valve: There was trivial regurgitation. Mitral valve with no stenosis no regurgitation. pk gradient 4 mmHg.   ASSESSMENT AND PLAN:  1. Acute on chronic diastolic HF. +Edema with wt gain and SOB with some chest pain. She does have chronic chest pain.  --no improvement with po lasix, pt is very tired. Discussed with Dr. Angelena Form and will admit to [facility] tele and give IV diuretics, monitor kidney function and K+. With her chest pain, difficult to determine if angina vs. Chronic pain will check troponin.   2. Chronic diastolic HF Last Echo [id number] with EF 60-61% and G1DD.   3. Hx hypokalemia  4. CAD non obstructive in 2014.  5. Hyperlipidemia on statin.   Current medicines are reviewed with the patient today. The patient Has no concerns regarding medicines.  The following changes have been made: See above Labs/ tests ordered today include:see above  Disposition: FU: see above  Lennie Muckle, NP  04/08/2016 3:33 PM  [facility] [place], [place], [place], [place] Phone: [phone]; Fax: [phone]        I have personally seen and examined this patient with Cecilie Kicks, NP. I agree with the assessment and plan as outlined above. She is followed by DR. Crenshaw. She presents today with c/o dyspnea, fatigue and chest pain. She has not seen a significant change in LE edema with increased dose of po Lasix over last week. Exam shows that she has LE edema. Lungs are clear. She has acute on chronic diastolic CHF. Will admit to [facility] to telemetry unit. Will plan diuresis with IV Lasix. No ischemic workup planned at this time.   Desman Polak 04/08/2016 4:25 PM

## 2016-04-08 NOTE — Progress Notes (Signed)
New pt admission from Direct Admission from Dr. Angelena Form office . Pt brought to the floor in stable condition. Vitals taken. Initial Assessment done. All immediate pertinent needs to patient addressed. Patient Guide given to patient. Important safety instructions relating to hospitalization reviewed with patient. Patient verbalized understanding. Will continue to monitor pt.  Maurene Capes RN

## 2016-04-08 NOTE — Progress Notes (Signed)
Cardiology Office Note   Date:  04/08/2016   ID:  Linda [name], DOB 05-Jan-1946, MRN [id number]  PCP:  Tammi Sou, MD  Cardiologist:  Dr. Stanford Breed    Chief Complaint  Patient presents with  . Edema    f/u edema  . Chest Pain      History of Present Illness: Linda [name] is a 70 y.o. female who presents for increased edema.  I talked to her on 03/30/16 for increased edema and had her increase her lasix.  Saw her on the 10th and again increased lasix but she has only lost 1 pound.      She has a hx. Of diastolic congestive heart failure. Cardiac catheterization February 2014 showed nonobstructive disease (most significant 50-60 RCA) and normal LV function; note of 4+ MR. Renal Dopplers May 2014 showed no stenosis. Nuclear study March 2016 showed ejection fraction 96% and no ischemia or infarction. Also with history of presumed orthostatic syncope. Previously followed by Dr Terrence Dupont. Echocardiogram repeated September 2016 and showed normal LV function, grade 1 diastolic dysfunction and trace tricuspid regurgitation. No MR.  Admitted October 2016 with acute diastolic congestive heart failure. Patient was treated with diuretics with improvement. Since last seen, Patient has dyspnea which sounds to be chronic per Dr. Jacalyn Lefevre note. She also has chest tightness which is chronic.   Today back for follow up she has only lost 1 pound and is very tired,  With her chronic pain of her back..  Also with chest discomfort though this is chronic. But with known CAD.       Past Medical History  Diagnosis Date  . HTN (hypertension)   . Depression   . Recurrent UTI     hx of hospitalization for pyelonephritis; started abx prophylaxis 06/2015  . Hay fever   . Mixed incontinence urge and stress   . Diverticular disease   . Insomnia   . Fibromyalgia     Patient states dx was around her late 46s but she had sx's for years prior to this.  . Syncope     Hypotensive; ED visit--Dr. Terrence Dupont  did Cath--nonobstructive CAD, EF 55-60%.  In retrospect, suspect pt rx med misuse/polypharmacy  . Idiopathic angio-edema-urticaria 72014    Angioedema component was very minimal  . Asthma     w/ asbestososis   . History of pneumonia     hospitalized 12/2011, 02/2013, and 07/2013 [facility]) for this  . Anginal pain ([place])     Nonobstructive CAD 2014; however, her cardiologist put her on a statin for this and NOT for hyperlipidemia per pt report.  . OSA on CPAP     prior to move to [place]--had another sleep study 10/2015 w/pulm Dr. Camillo Flaming.  . H/O hiatal hernia   . Migraine syndrome     "not as often anymore; used to be ~ q wk" (07/12/2013)  . Tension headache, chronic   . DDD (degenerative disc disease)     lumbar and cervical.   . Osteoarthritis     "severe; progressing fast" (07/12/2013); multiple joints-not surgical candidate for TKR (03/2015)  . Chronic lower back pain   . Anxiety     panic attacks  . Nephrolithiasis     "passed all on my own or they are still in there" (07/12/2013)  . Pyelonephritis     "several times over the last 30 yr" (07/12/2013)  . Diastolic congestive heart failure ([place])   . COPD (chronic obstructive pulmonary disease) ([place])   .  Pulmonary embolism ([place]) 07/2013    Dx at [facility] with very small peripheral upper lobe pe 07/2013: pt took coumadin for about 8-9 mo  . Pleural plaque with presence of asbestos 07/22/2013  . BPPV (benign paroxysmal positional vertigo) 12/16/2012  . RBBB (right bundle branch block)   . Acute upper GI bleed 06/2014    while pt taking coumadin, plavix, and meloxicam---despite being told not to take coumadin.  . Iron deficiency anemia     Hematologist in [place], [place] did extensive w/u; no cause found; failed oral supplement;; gets fairly regular (q52m or so) IV iron infusions (Venofer -iron sucrose- 200mg  with procrit.  "for 14 yr I've been getting blood work q month & getting infusions prn" (07/12/2013).  Dr. Marin Olp locally, iron infusions done, EPO  deficiency dx'd  . Pernicious anemia 08/24/2014    Past Surgical History  Procedure Laterality Date  . Appendectomy  1960  . Total abdominal hysterectomy  1974  . Tendon release  1996    Right forearm and hand  . Knee surgery  2005  . Heel spur surgery Left 2008  . Plantar fascia release Left 2008  . Axillary surgery Left 1978    Multiple "lump" in armpit per pt  . Coccyx removal  1972  . Cardiac catheterization  01/2013    nonobstructive CAD, EF 55-60%  . Transthoracic echocardiogram  01/2013; 04/2014;08/2015    2014--NORMAL.  2015--focal basal septal hypertrophy, EF 55-60%, grade I diast dysfxn, mild LAE.  08/2015 EF 55-60%, nl LV syst fxn, grade I DD, valves wnl  . Dilation and curettage of uterus  ? 1970's  . Eye surgery Left 2012-2013    "injections for ~ 1 yr; don't really know what for" (07/12/2013)  . Spirometry  04/25/14    In hosp for acute asthma/COPD flare: mixed obstructive and restrictive lung disease. The FEV1 is severely reduced at 45% predicted.  FEV1 signif decreased compared to prior spirometry 07/23/13.  [name] Kitchen Esophagogastroduodenoscopy N/A 07/19/2014    Gastritis found + in the setting of supratherapeutic INR, +plavix, + meloxicam.  . Left heart catheterization with coronary angiogram N/A 01/30/2013    Procedure: LEFT HEART CATHETERIZATION WITH CORONARY ANGIOGRAM;  Surgeon: Clent Demark, MD;  Location: [facility] C[facility] LAB;  Service: Cardiovascular;  Laterality: N/A;  . Cardiovascular stress test  02/22/15    Low risk myocard perf imaging; wall motion normal, normal EF     Current Outpatient Prescriptions  Medication Sig Dispense Refill  . albuterol (PROVENTIL HFA;VENTOLIN HFA) 108 (90 BASE) MCG/ACT inhaler Inhale 2 puffs into the lungs every 6 (six) hours as needed for wheezing or shortness of breath. 1 Inhaler 1  . ALPRAZolam (XANAX) 1 MG tablet Take 1 tablet (1 mg total) by mouth 3 (three) times daily as needed for anxiety. 90 tablet 5  . ARIPiprazole (ABILIFY) 10 MG tablet Take 1  tablet (10 mg total) by mouth daily. 30 tablet 2  . aspirin 81 MG EC tablet Take 1 tablet (81 mg total) by mouth daily. 30 tablet   . budesonide-formoterol (SYMBICORT) 160-4.5 MCG/ACT inhaler Inhale 2 puffs into the lungs 2 (two) times daily. 1 Inhaler 12  . BYSTOLIC 5 MG tablet TAKE 1 TABLET ONCE DAILY. 30 tablet 12  . Cholecalciferol (VITAMIN D3) 50000 units CAPS Take 1 capsule by mouth every 30 (thirty) days. One Monday per month    . diclofenac sodium (VOLTAREN) 1 % GEL Apply 4 g topically 3 (three) times daily.    . DULoxetine (CYMBALTA)  30 MG capsule TAKE 1 CAPSULE ONCE DAILY ALONG WITH 60MG  CAPSULE FOR TOTAL 90MG  DAILY. 30 capsule 11  . DULoxetine (CYMBALTA) 60 MG capsule Take 60 mg by mouth daily. to be combined with a 30 mg duloxetine cap daily    . folic acid (FOLVITE) 1 MG tablet Take 2 tablets (2 mg total) by mouth daily. 60 tablet 6  . furosemide (LASIX) 40 MG tablet Take 40 mg by mouth daily.    . isosorbide mononitrate (IMDUR) 30 MG 24 hr tablet TAKE 1 TABLET ONCE DAILY. 30 tablet 6  . LYRICA 300 MG capsule TAKE (1) CAPSULE TWICE DAILY. 60 capsule 0  . Misc Natural Products (OSTEO BI-FLEX ADV JOINT SHIELD) TABS Take 1 tablet by mouth daily.    . Multiple Vitamin (MULTIVITAMIN WITH MINERALS) TABS tablet Take 1 tablet by mouth daily.    . nitroGLYCERIN (NITROSTAT) 0.4 MG SL tablet Place 0.4 mg under the tongue every 5 (five) minutes as needed for chest pain (x 3 doses).     . ondansetron (ZOFRAN-ODT) 4 MG disintegrating tablet Take 4 mg by mouth every 8 (eight) hours as needed for nausea or vomiting.    [name] Kitchen oxyCODONE-acetaminophen (PERCOCET) 10-325 MG tablet Take 1 tablet by mouth every 8 (eight) hours as needed for pain. ONE MONTH SUPPLY 100 tablet 0  . OXYGEN Inhale 3 L into the lungs continuous.    . pantoprazole (PROTONIX) 40 MG tablet Take 1 tablet (40 mg total) by mouth daily. 90 tablet 1  . Polyethyl Glycol-Propyl Glycol (SYSTANE) 0.4-0.3 % SOLN Place 1 drop into both eyes daily.     . polyethylene glycol (MIRALAX / GLYCOLAX) packet Take 17 g by mouth daily.    . potassium chloride SA (K-DUR,KLOR-CON) 20 MEQ tablet Take 20 mEq by mouth as directed. Take 2 tabs in the morning and one tablet in the evening    . traZODone (DESYREL) 50 MG tablet 2-4 tabs po qhs prn insomnia 120 tablet 6   No current facility-administered medications for this visit.   Facility-Administered Medications Ordered in Other Visits  Medication Dose Route Frequency Provider Last Rate Last Dose  . ferumoxytol (FERAHEME) 510 mg in sodium chloride 0.9 % 100 mL IVPB  510 mg Intravenous Once Volanda Napoleon, MD        Allergies:   Penicillins    Social History:  The patient  reports that she has never smoked. She has never used smokeless tobacco. She reports that she does not drink alcohol or use illicit drugs.   Family History:  The patient's family history includes Arthritis in her mother; Diabetes in her father; Heart disease in her father; Hypertension in her father; Kidney disease in her mother; Stroke in her father.    ROS:  General:no colds or fevers, one pound weight loss Skin:no rashes or ulcers HEENT:no blurred vision, no congestion CV:see HPI PUL:see HPI GI:no diarrhea constipation or melena, no indigestion GU:no hematuria, no dysuria MS:no joint pain, no claudication, chronic back pain Neuro:no syncope, no lightheadedness Endo:no diabetes, no thyroid disease  Wt Readings from Last 3 Encounters:  04/08/16 175 lb 9.6 oz (79.652 kg)  04/02/16 176 lb 9.6 oz (80.105 kg)  04/01/16 175 lb 12 oz (79.72 kg)     PHYSICAL EXAM: VS:  BP 115/68 mmHg  Pulse 78  Ht 5\' 4"  (1.626 m)  Wt 175 lb 9.6 oz (79.652 kg)  BMI 30.13 kg/m2  SpO2 92% , BMI Body mass index is 30.13 kg/(m^2). General:Pleasant affect, though  flat  NAD Skin:Warm and dry, brisk capillary refill HEENT:normocephalic, sclera clear, mucus membranes moist Neck:supple, no JVD, no bruits sitting up Heart:S1S2 RRR without  murmur, gallup, rub or click Lungs:diminished without rales, rhonchi, occ wheezes [id number], non tender, + BS, do not palpate liver spleen or masses Ext:2+ to knees lower ext edema,  2+ radial pulses Neuro:alert and oriented X 3, MAE, follows commands, + facial symmetry    EKG:  EKG is ordered today. The ekg ordered today demonstrates SR with RBBB no acute changes.    Recent Labs: 08/24/2015: Pro B Natriuretic peptide (BNP) 92.0 10/16/2015: B Natriuretic Peptide 15.4 01/02/2016: TSH 0.539 01/03/2016: ALT 12*; Magnesium 2.0 01/08/2016: Hemoglobin 13.1; Platelets 215.0 04/01/2016: BUN 13; Creatinine, Ser 0.73; Potassium 3.3*; Sodium 141    Lipid Panel    Component Value Date/Time   CHOL 152 01/31/2013 0500   TRIG 92 01/31/2013 0500   HDL 44 01/31/2013 0500   CHOLHDL 3.5 01/31/2013 0500   VLDL 18 01/31/2013 0500   LDLCALC 90 01/31/2013 0500       Other studies Reviewed: Additional studies/ records that were reviewed today include: . ECHO 08/23/16 Study Conclusions  - Left ventricle: The cavity size was normal. Systolic function was  normal. The estimated ejection fraction was in the range of 60%  to 65%. Wall motion was normal; there were no regional wall  motion abnormalities. There was an increased relative  contribution of atrial contraction to ventricular filling.  Doppler parameters are consistent with abnormal left ventricular  relaxation (grade 1 diastolic dysfunction). - Tricuspid valve: There was trivial regurgitation. Mitral valve with no stenosis no regurgitation.  pk gradient 4 mmHg.   ASSESSMENT AND PLAN:  1.  Acute on chronic diastolic HF.  +Edema with wt gain and SOB with some chest pain.  She does have chronic chest pain.  --no improvement with po lasix, pt is very tired. Discussed with Dr. Angelena Form and will admit to [facility] tele and give IV diuretics, monitor kidney function and K+.  With her chest pain, difficult to determine if angina vs. Chronic pain  will check troponin.    2. Chronic diastolic HF Last Echo [id number] with EF 60-61% and G1DD.   3.  Hx hypokalemia  4.  CAD non obstructive in 2014.  5.  Hyperlipidemia on statin.   Current medicines are reviewed with the patient today.  The patient Has no concerns regarding medicines.  The following changes have been made:  See above Labs/ tests ordered today include:see above  Disposition:   FU:  see above  Lennie Muckle, NP  04/08/2016 3:33 PM    [facility], [place], [place], [place] Phone: [phone]; Fax: [phone]

## 2016-04-08 NOTE — Patient Instructions (Signed)
Pt to be admitted.

## 2016-04-08 NOTE — Addendum Note (Signed)
Addended by: Eulis Foster on: 04/08/2016 02:54 PM   Modules accepted: Orders

## 2016-04-09 ENCOUNTER — Inpatient Hospital Stay (HOSPITAL_COMMUNITY): Payer: Medicare Other

## 2016-04-09 ENCOUNTER — Encounter (HOSPITAL_COMMUNITY): Payer: Self-pay | Admitting: General Practice

## 2016-04-09 DIAGNOSIS — R300 Dysuria: Secondary | ICD-10-CM

## 2016-04-09 DIAGNOSIS — I2510 Atherosclerotic heart disease of native coronary artery without angina pectoris: Secondary | ICD-10-CM

## 2016-04-09 DIAGNOSIS — E785 Hyperlipidemia, unspecified: Secondary | ICD-10-CM

## 2016-04-09 LAB — BASIC METABOLIC PANEL
Anion gap: 12 (ref 5–15)
BUN: 12 mg/dL (ref 6–20)
CHLORIDE: 98 mmol/L — AB (ref 101–111)
CO2: 32 mmol/L (ref 22–32)
CREATININE: 1.05 mg/dL — AB (ref 0.44–1.00)
Calcium: 8.9 mg/dL (ref 8.9–10.3)
GFR calc Af Amer: >60 mL/min (ref >60)
GFR calc non Af Amer: 53 mL/min — ABNORMAL LOW (ref >60)
GLUCOSE: 109 mg/dL — AB (ref 65–99)
Potassium: 3.5 mmol/L (ref 3.5–5.1)
SODIUM: 142 mmol/L (ref 135–145)

## 2016-04-09 LAB — URINALYSIS, ROUTINE W REFLEX MICROSCOPIC
BILIRUBIN URINE: NEGATIVE
GLUCOSE, UA: NEGATIVE mg/dL
HGB URINE DIPSTICK: NEGATIVE
Ketones, ur: NEGATIVE mg/dL
Leukocytes, UA: NEGATIVE
Nitrite: NEGATIVE
PH: 5.5 (ref 5.0–8.0)
Protein, ur: NEGATIVE mg/dL
SPECIFIC GRAVITY, URINE: 1.012 (ref 1.005–1.030)

## 2016-04-09 LAB — TROPONIN I: Troponin I: <0.03 ng/mL (ref <0.031)

## 2016-04-09 MED ORDER — ATORVASTATIN CALCIUM 20 MG PO TABS
20.0000 mg | ORAL_TABLET | Freq: Every day | ORAL | Status: DC
  Administered 2016-04-09 – 2016-04-10 (×2): 20 mg via ORAL
  Filled 2016-04-09 (×2): qty 1

## 2016-04-09 MED ORDER — FUROSEMIDE 40 MG PO TABS
40.0000 mg | ORAL_TABLET | Freq: Every day | ORAL | Status: DC
  Administered 2016-04-10 – 2016-04-11 (×2): 40 mg via ORAL
  Filled 2016-04-09 (×2): qty 1

## 2016-04-09 NOTE — Progress Notes (Signed)
Subjective: Feels a little better.  Chronic issues in knees and back. Calf and hand cramps.    Objective: Vital signs in last 24 hours: Temp:  [97.4 F (36.3 C)-98 F (36.7 C)] 97.5 F (36.4 C) (04/18 0856) Pulse Rate:  [65-78] 71 (04/18 0856) Resp:  [17-20] 18 (04/18 0856) BP: (99-139)/(52-68) 124/52 mmHg (04/18 0856) SpO2:  [92 %-100 %] 97 % (04/18 0856) Weight:  [170 lb 6.4 oz (77.293 kg)-175 lb 9.6 oz (79.652 kg)] 170 lb 6.4 oz (77.293 kg) (04/18 0602) Last BM Date: 04/07/16  Intake/Output from previous day: 04/17 0701 - 04/18 0700 In: -  Out: 1150 [Urine:1150] Intake/Output this shift: Total I/O In: 600 [P.O.:600] Out: 450 [Urine:450]  Medications Scheduled Meds: . ARIPiprazole  10 mg Oral Daily  . aspirin EC  81 mg Oral Daily  . atorvastatin  20 mg Oral QHS  . DULoxetine  90 mg Oral QHS  . folic acid  2 mg Oral Daily  . furosemide  60 mg Intravenous Q12H  . heparin  5,000 Units Subcutaneous 3 times per day  . isosorbide mononitrate  30 mg Oral Daily  . mometasone-formoterol  2 puff Inhalation BID  . multivitamin with minerals  1 tablet Oral Daily  . nebivolol  5 mg Oral Daily  . pantoprazole  40 mg Oral Daily  . polyvinyl alcohol  1 drop Both Eyes Daily  . potassium chloride  20 mEq Oral QHS  . potassium chloride  40 mEq Oral q morning - 10a  . pregabalin  300 mg Oral BID  . sodium chloride flush  3 mL Intravenous Q12H   Continuous Infusions:  PRN Meds:.sodium chloride, acetaminophen, albuterol, ALPRAZolam, nitroGLYCERIN, ondansetron (ZOFRAN) IV, ondansetron, oxyCODONE-acetaminophen, sodium chloride flush, traZODone  PE: General appearance: alert, cooperative and no distress Neck: no JVD Lungs: Left sisded crackles Heart: regular rate and rhythm, S1, S2 normal, no murmur, click, rub or gallop Abdomen: +BS nontender Extremities: No LEE Pulses: 2+ and symmetric Skin: Warm and dry Neurologic: Grossly normal  Lab Results:   Recent Labs   04/08/16 1800  WBC 6.1  HGB 13.9  HCT 42.5  PLT 134*   BMET  Recent Labs  04/08/16 1800 04/09/16 0513  NA 142 142  K 4.1 3.5  CL 101 98*  CO2 31 32  GLUCOSE 91 109*  BUN 11 12  CREATININE 0.94 1.05*  CALCIUM 9.5 8.9   PT/INR  Recent Labs  04/08/16 1800  LABPROT 13.9  INR 1.05    Assessment/Plan 70 y.o. female who presented to office for increased edema.  hx. Of diastolic congestive heart failure, orthostatic syncope. Cardiac catheterization February 2014 showed nonobstructive disease (most significant 50-60 RCA) and normal LV function.  Nuclear study March 2016 showed ejection fraction 96% and no ischemia or infarction.  Echocardiogram repeated September 2016 and showed normal LV function, grade 1 diastolic dysfunction and trace tricuspid regurgitation. No MR.      Acute on chronic diastolic CHF (congestive heart failure), NYHA class 1 (HCC) Net fluids:  -1.2L plus unmeasured urine x2.  She does not look significantly volume overloaded.  DC IV lasix and change back to PO lasix for tomorrow.  She already got IV 60mg  this morning.  SCr up slightly.  She said she was up about 10 pounds.  CXR did not should CHF and BNP was only 30.2.          HTN (hypertension), benign  Controlled.    Fibromyalgia syndrome   OSA (obstructive  sleep apnea)   Chronic pain syndrome   Anxiety and depression   Hypokalemia: WNL now.   Hyperlipidemia  On statin   CAD (coronary artery disease)  Ruled out for MI.     OA knees    Gets PT OT at home    LOS: 1 day    Carletta Feasel PA-C 04/09/2016 10:31 AM

## 2016-04-10 DIAGNOSIS — I10 Essential (primary) hypertension: Secondary | ICD-10-CM

## 2016-04-10 LAB — BASIC METABOLIC PANEL
ANION GAP: 11 (ref 5–15)
BUN: 16 mg/dL (ref 6–20)
CHLORIDE: 99 mmol/L — AB (ref 101–111)
CO2: 31 mmol/L (ref 22–32)
Calcium: 9.4 mg/dL (ref 8.9–10.3)
Creatinine, Ser: 0.93 mg/dL (ref 0.44–1.00)
Glucose, Bld: 104 mg/dL — ABNORMAL HIGH (ref 65–99)
POTASSIUM: 3.9 mmol/L (ref 3.5–5.1)
SODIUM: 141 mmol/L (ref 135–145)

## 2016-04-10 MED ORDER — OXYCODONE-ACETAMINOPHEN 5-325 MG PO TABS
2.0000 | ORAL_TABLET | Freq: Three times a day (TID) | ORAL | Status: DC | PRN
Start: 2016-04-10 — End: 2016-04-11
  Administered 2016-04-10 – 2016-04-11 (×3): 2 via ORAL
  Filled 2016-04-10 (×3): qty 2

## 2016-04-10 NOTE — Care Management Note (Addendum)
Case Management Note  Patient Details  Name: Linda [name] MRN: [id number] Date of Birth: 24-Jun-1946  Subjective/Objective:  Pt admitted with acute on chronic CHF                  Action/Plan:  Pt is independent from [place], pt states she periodically uses walker.  Pt has PT, OT 5 days a week provided by [facility] [facility] agency.    arranged through [facility] and will plans to continue.  Pt states she weighs herself daily and adheres to low salt diet.  CM ordered PT/OT evaluation.  Pt stated that if there is a need for supervision post discharge she can arrange it with Alfredo Bach as she has done in the past.  Pt on home O2 3 Liters through [facility].  CM will continue to monitor for disposition needs   Expected Discharge Date:                  Expected Discharge Plan:  Home/Self Care  In-House Referral:     Discharge planning Services  CM Consult  Post Acute Care Choice:    Choice offered to:     DME Arranged:    DME Agency:     HH Arranged:    HH Agency:     Status of Service:  In process, will continue to follow  Medicare Important Message Given:    Date Medicare IM Given:    Medicare IM give by:    Date Additional Medicare IM Given:    Additional Medicare Important Message give by:     If discussed at [place] of Stay Meetings, dates discussed:    Additional Comments: Pt discharged home on 3 Liters.  CM requested bedside nurse to obtain [facility] order for resumption order  Maryclare Labrador, RN 04/10/2016, 12:04 PM

## 2016-04-10 NOTE — Progress Notes (Signed)
PATIENT ID: [id number] with chronic diastolic heart failure, hyperlipidemia, asbestosis, and CAD here with presumed acute on chronic diastolic heart failure.  SUBJECTIVE:  Feeling somewhat better.  Still feels weak and tired.  Breathing is improved.    PHYSICAL EXAM Filed Vitals:   04/09/16 1149 04/09/16 2300 04/10/16 0700 04/10/16 0742  BP: 94/49 106/62 127/64   Pulse: 76 72 74   Temp: 97.9 F (36.6 C) 97.6 F (36.4 C) 97.4 F (36.3 C)   TempSrc: Oral Oral Oral   Resp: 18 18 20    Height:      Weight:   76 kg (167 lb 8.8 oz)   SpO2: 97% 96% 97% 97%   Gen: Well-appearing. No acute distress.  Neck: No JVD COR: RRR. No m/r/g  Lungs: Dry crackles at the bases. No wheezes or rhonchi Abd: Soft, NT, ND. +BS  Ext: WWP. No edema.   LABS: Lab Results  Component Value Date   TROPONINI <0.03 04/09/2016   Results for orders placed or performed during the hospital encounter of 04/08/16 (from the past 24 hour(s))  Urinalysis, Routine w reflex microscopic (not at [facility])     Status: None   Collection Time: 04/09/16  2:26 PM  Result Value Ref Range   Color, Urine YELLOW YELLOW   APPearance CLEAR CLEAR   Specific Gravity, Urine 1.012 1.005 - 1.030   pH 5.5 5.0 - 8.0   Glucose, UA NEGATIVE NEGATIVE mg/dL   Hgb urine dipstick NEGATIVE NEGATIVE   Bilirubin Urine NEGATIVE NEGATIVE   Ketones, ur NEGATIVE NEGATIVE mg/dL   Protein, ur NEGATIVE NEGATIVE mg/dL   Nitrite NEGATIVE NEGATIVE   Leukocytes, UA NEGATIVE NEGATIVE  Basic metabolic panel     Status: Abnormal   Collection Time: 04/10/16  3:16 AM  Result Value Ref Range   Sodium 141 135 - 145 mmol/L   Potassium 3.9 3.5 - 5.1 mmol/L   Chloride 99 (L) 101 - 111 mmol/L   CO2 31 22 - 32 mmol/L   Glucose, Bld 104 (H) 65 - 99 mg/dL   BUN 16 6 - 20 mg/dL   Creatinine, Ser 0.93 0.44 - 1.00 mg/dL   Calcium 9.4 8.9 - 10.3 mg/dL   GFR calc non Af Amer >60 >60 mL/min   GFR calc Af Amer >60 >60 mL/min   Anion gap 11 5 - 15     Intake/Output Summary (Last 24 hours) at 04/10/16 1105 Last data filed at 04/10/16 1007  Gross per 24 hour  Intake    960 ml  Output    950 ml  Net     10 ml    Telemetry:  Sinus rhythm.  PVCs.  ASSESSMENT AND PLAN:  Principal Problem:   Acute on chronic diastolic CHF (congestive heart failure), NYHA class 1 (HCC) Active Problems:   HTN (hypertension), benign   Fibromyalgia syndrome   OSA (obstructive sleep apnea)   Chronic pain syndrome   Anxiety and depression   Hypokalemia   Hyperlipidemia   CAD (coronary artery disease)   # Acute on chronic diastolic heart failure: [name] is euvolemic on exam and her respiratory status has improved.  BNP was normal and no edema was seen on CXR.  Renal function initially worsened with diuresis but has normalized after holding IV lasix.  We resumed lasix 40 mg po daily today.  BP is well-controlled.  Continue nebivolol and imdur.  I suspect that much of her shortness of breath is attributed to her asbestosis.   #  Hypertension: Continue nebivolol.  BP well-controlled.   # CAD: # Hyperlipidemia: Continue aspirin, nebivolol and atovastatin.  # Dispo: [name] does not feel ready to go home today.  Will plan for discharge home tomorrow.  Offered PT/OT, but she states she already gets PT/OT 3 times per week at home.    Linda [name] C. Oval Linsey, MD, [facility] 04/10/2016 11:05 AM

## 2016-04-10 NOTE — Consult Note (Signed)
   [facility] Inpatient Consult   04/10/2016  Linda [name] 1946/04/28 [id number]   Patient evaluated for community based chronic disease management services with [place] [facility] as a benefit of patient's Medicare Insurance. Spoke with patient at bedside to explain [place] Management services. Patient admitted for HF exacerbation 3 admissions/6 mo.  Patient has a flat affect and appears tired.  She endorses [name] as her primary care provider.  Patient states she lives in an independent setting at [facility] in which she gets 3 meals provided, transportation, and therapy if needed.  She states she realizes she has been admitted twice in the past couple of months for HF exacerbation despite her efforts in contacting her physician if she is up 3 or more pounds.  She states she weighs herself. Consent form signed.   Patient will receive post hospital discharge call and will be evaluated for monthly home visits for assessments and disease process education.  Left contact information and [facility] literature at bedside. Made Inpatient Case Manager aware that [place] Management following. Of note, [facility] Care Management services does not replace or interfere with any services that are arranged by inpatient case management or social work.  For additional questions or referrals please contact:    Natividad Brood, RN BSN [facility] Liaison  [phone] business mobile phone Toll free office [phone]

## 2016-04-11 ENCOUNTER — Other Ambulatory Visit: Payer: Self-pay

## 2016-04-11 DIAGNOSIS — J61 Pneumoconiosis due to asbestos and other mineral fibers: Secondary | ICD-10-CM

## 2016-04-11 DIAGNOSIS — R0602 Shortness of breath: Secondary | ICD-10-CM

## 2016-04-11 LAB — BASIC METABOLIC PANEL
ANION GAP: 12 (ref 5–15)
BUN: 14 mg/dL (ref 6–20)
CHLORIDE: 100 mmol/L — AB (ref 101–111)
CO2: 29 mmol/L (ref 22–32)
Calcium: 9.2 mg/dL (ref 8.9–10.3)
Creatinine, Ser: 0.85 mg/dL (ref 0.44–1.00)
Glucose, Bld: 122 mg/dL — ABNORMAL HIGH (ref 65–99)
POTASSIUM: 4.1 mmol/L (ref 3.5–5.1)
SODIUM: 141 mmol/L (ref 135–145)

## 2016-04-11 MED ORDER — POLYVINYL ALCOHOL 1.4 % OP SOLN: 1.0000 [drp] | Freq: Every day | OPHTHALMIC | Status: DC

## 2016-04-11 NOTE — Progress Notes (Signed)
Patient Patient is discharge to home at 4:05 pm accompanied by patient's son and NT via wheelchair. Discharge instructions given . Patient verbalizes understanding. All personal belongings given. Telemetry box and IV removed prior to discharge and site in good condition.

## 2016-04-11 NOTE — Discharge Summary (Signed)
Discharge Summary    Patient ID: Linda [name],  MRN: [id number], DOB/AGE: 1946-11-18 70 y.o.  Admit date: 04/08/2016 Discharge date: 04/11/2016  Primary Care Provider: MCGOWEN,PHILIP H Primary Cardiologist: Dr. Stanford Breed  Discharge Diagnoses    Principal Problem:   Acute on chronic diastolic CHF (congestive heart failure), NYHA class 1 (HCC) Active Problems:   HTN (hypertension), benign   Fibromyalgia syndrome   OSA (obstructive sleep apnea)   Chronic pain syndrome   Anxiety and depression   Hypokalemia   Hyperlipidemia   CAD (coronary artery disease)   Allergies Allergies  Allergen Reactions  . Penicillins Itching, Swelling and Rash    Tolerated Cefepime in ED. Has patient had a PCN reaction causing immediate rash, facial/tongue/throat swelling, SOB or lightheadedness with hypotension: Yes Has patient had a PCN reaction causing severe rash involving mucus membranes or skin necrosis: No Has patient had a PCN reaction that required hospitalization: No  Has patient had a PCN reaction occurring within the last 10 years: No     Diagnostic Studies/Procedures    None   History of Present Illness     Linda [name] is a 70 y.o. female who presented 04/08/16 to office for increased edema.On 03/30/16 lasix was increased for increased edema. Saw her on the 10th and again increased lasix but she has only lost 1 pound.   She has a hx. Of diastolic congestive heart failure. Cardiac catheterization February 2014 showed nonobstructive disease (most significant 50-60 RCA) and normal LV function; note of 4+ MR. Renal Dopplers May 2014 showed no stenosis. Nuclear study March 2016 showed ejection fraction 96% and no ischemia or infarction. Also with history of presumed orthostatic syncope. Previously followed by Dr Terrence Dupont. Echocardiogram repeated September 2016 and showed normal LV function, grade 1 diastolic dysfunction and trace tricuspid regurgitation. No MR. Admitted  October 2016 with acute diastolic congestive heart failure. Patient was treated with diuretics with improvement. Since last seen, Patient has dyspnea which sounds to be chronic per Dr. Jacalyn Lefevre note. She also has chest tightness which is chronic.   Seen in clinic 04/08/16 for follow up she has only lost 1 pound and is very tired, with her chronic pain of her back.. Also with chest discomfort though this is chronic. But with known CAD--> non obstructive in 2014.    She was admitted directly for IV diuresis from clinic. Last Echo 08/2015 with EF 60-61% and G1DD.    Hospital Course     Consultants: None  1# Acute on chronic diastolic heart failure: .BNP was normal and no edema was seen on CXR. Renal function initially worsened with diuresis but has normalized after holding IV lasix. Then remained normal on lasix 40 mg po daily. BP is well-controlled. Continue nebivolol and imdur. Dr. Benson Setting that much of her shortness of breath is attributed to her asbestosis. Total diuresis of net negative 1.9L with 6 lb weight loss (174-->168lb).   2 # Hypertension: Continue nebivolol. BP well-controlled.   3 # CAD Continue aspirin, nebivolol and atovastatin.  4 # hyperlipidemia: Continue stain.   The patient has been seen by Dr. Oval Linsey today and deemed ready for discharge home. All follow-up appointments have been scheduled. Discharge medications are listed below.   Offered PT/OT, but she states she already gets PT/OT 3 times per week at home. Will continue at home. Denies evaluation for SNF. She lives by her self and has oxygen at home. No change in home medications.   Discharge Vitals Blood  pressure 108/65, pulse 70, temperature 97 F (36.1 C), temperature source Oral, resp. rate 18, height 5\' 4"  (1.626 m), weight 168 lb 6.4 oz (76.386 kg), SpO2 96 %.  Filed Weights   04/09/16 0602 04/10/16 0700 04/11/16 0551  Weight: 170 lb 6.4 oz (77.293 kg) 167 lb 8.8 oz (76 kg) 168 lb 6.4  oz (76.386 kg)    Labs & Radiologic Studies     CBC  Recent Labs  04/08/16 1800  WBC 6.1  NEUTROABS 3.3  HGB 13.9  HCT 42.5  MCV 89.5  PLT Q000111Q*   Basic Metabolic Panel  Recent Labs  04/08/16 1800  04/10/16 0316 04/11/16 0229  NA 142  < > 141 141  K 4.1  < > 3.9 4.1  CL 101  < > 99* 100*  CO2 31  < > 31 29  GLUCOSE 91  < > 104* 122*  BUN 11  < > 16 14  CREATININE 0.94  < > 0.93 0.85  CALCIUM 9.5  < > 9.4 9.2  MG 2.0  --   --   --   < > = values in this interval not displayed. Liver Function Tests  Recent Labs  04/08/16 1800  AST 18  ALT 13*  ALKPHOS 73  BILITOT 1.0  PROT 6.8  ALBUMIN 3.7   No results for input(s): LIPASE, AMYLASE in the last 72 hours. Cardiac Enzymes  Recent Labs  04/08/16 1800 04/08/16 2245 04/09/16 0513  TROPONINI <0.03 <0.03 <0.03   Thyroid Function Tests  Recent Labs  04/08/16 1800  TSH 1.002    Dg Chest Port 1 View  04/09/2016  CLINICAL DATA:  Shortness of Breath EXAM: PORTABLE CHEST 1 VIEW COMPARISON:  Chest CT January 01, 2016; chest radiograph February 01, 2016 FINDINGS: Bilateral calcified pleural plaques are again noted without appreciable change. There is no edema or consolidation. Heart is upper normal in size with pulmonary vascularity within normal limits. No adenopathy. There is atherosclerotic calcification in the aorta. IMPRESSION: Multiple calcified pleural plaques, unchanged. No edema or consolidation. Stable cardiac silhouette. Electronically Signed   By: Lowella Grip III M.D.   On: 04/09/2016 08:05    Disposition   Pt is being discharged home today in good condition.  Follow-up Plans & Appointments    Follow-up Information    Follow up with [name], MD. Go on 05/06/2016.   Specialty:  Cardiology   Why:  @12 :15 for post hospital    Contact information:   [address] [place] [id number] [phone]      Discharge Instructions    AMB Referral to [facility]     Complete by:  As directed   Reason for consult:  HF exacerbation, 3rd admission, post hospital follow up - monitoring  Diagnoses of:  Heart Failure  Expected date of contact:  1-3 days (reserved for hospital discharges)  Please assign to community nurse for transition of care calls and assess for home visits.  Patient is at [facility], [place].  HF exacerbation.   Questions please call:   Natividad Brood, RN BSN [facility] Liaison  [phone] business mobile phone Toll free office [phone]     Diet - low sodium heart healthy    Complete by:  As directed      Discharge instructions    Complete by:  As directed   Continue home health PT/OT and oxygen.     Increase activity slowly    Complete  by:  As directed            Discharge Medications   Current Discharge Medication List    CONTINUE these medications which have NOT CHANGED   Details  albuterol (PROVENTIL HFA;VENTOLIN HFA) 108 (90 BASE) MCG/ACT inhaler Inhale 2 puffs into the lungs every 6 (six) hours as needed for wheezing or shortness of breath. Qty: 1 Inhaler, Refills: 1   Associated Diagnoses: Left leg swelling    ALPRAZolam (XANAX) 1 MG tablet Take 1 tablet (1 mg total) by mouth 3 (three) times daily as needed for anxiety. Qty: 90 tablet, Refills: 5    ARIPiprazole (ABILIFY) 10 MG tablet Take 1 tablet (10 mg total) by mouth daily. Qty: 30 tablet, Refills: 2    aspirin 81 MG EC tablet Take 1 tablet (81 mg total) by mouth daily. Qty: 30 tablet    atorvastatin (LIPITOR) 20 MG tablet Take 20 mg by mouth at bedtime.    budesonide-formoterol (SYMBICORT) 160-4.5 MCG/ACT inhaler Inhale 2 puffs into the lungs 2 (two) times daily. Qty: 1 Inhaler, Refills: 12   Associated Diagnoses: Asthma, chronic obstructive, with acute exacerbation (HCC)    BYSTOLIC 5 MG tablet TAKE 1 TABLET ONCE DAILY. Qty: 30 tablet, Refills: 12    Cholecalciferol (VITAMIN D3) 50000 units CAPS Take 1 capsule  by mouth every Monday.     diclofenac sodium (VOLTAREN) 1 % GEL Apply 1 application topically 3 (three) times daily as needed (pain).     !! DULoxetine (CYMBALTA) 30 MG capsule TAKE 1 CAPSULE ONCE DAILY ALONG WITH 60MG  CAPSULE FOR TOTAL 90MG  DAILY. Qty: 30 capsule, Refills: 11    !! DULoxetine (CYMBALTA) 60 MG capsule Take 60 mg by mouth at bedtime. Take with a 30 mg capsule for a 90 mg dose    folic acid (FOLVITE) 1 MG tablet Take 2 tablets (2 mg total) by mouth daily. Qty: 60 tablet, Refills: 6   Associated Diagnoses: Anemia associated with acute blood loss    furosemide (LASIX) 40 MG tablet Take 40 mg by mouth daily.    ibuprofen (ADVIL,MOTRIN) 800 MG tablet Take 800 mg by mouth daily as needed (pain).    isosorbide mononitrate (IMDUR) 30 MG 24 hr tablet TAKE 1 TABLET ONCE DAILY. Qty: 30 tablet, Refills: 6    LYRICA 300 MG capsule TAKE (1) CAPSULE TWICE DAILY. Qty: 60 capsule, Refills: 0    Misc Natural Products (OSTEO BI-FLEX ADV JOINT SHIELD) TABS Take 1 tablet by mouth daily.    Multiple Vitamin (MULTIVITAMIN WITH MINERALS) TABS tablet Take 1 tablet by mouth daily.    nitroGLYCERIN (NITROSTAT) 0.4 MG SL tablet Place 0.4 mg under the tongue every 5 (five) minutes as needed for chest pain (x 3 doses).     ondansetron (ZOFRAN-ODT) 4 MG disintegrating tablet Take 4 mg by mouth every 8 (eight) hours as needed for nausea or vomiting.    oxyCODONE-acetaminophen (PERCOCET) 10-325 MG tablet Take 1 tablet by mouth every 8 (eight) hours as needed for pain. ONE MONTH SUPPLY Qty: 100 tablet, Refills: 0   Associated Diagnoses: Primary osteoarthritis of right knee; Fibromyalgia syndrome    OXYGEN Inhale 3 L into the lungs continuous.    pantoprazole (PROTONIX) 40 MG tablet Take 1 tablet (40 mg total) by mouth daily. Qty: 90 tablet, Refills: 1    polyethylene glycol (MIRALAX / GLYCOLAX) packet Take 17 g by mouth daily as needed (constipation). Mix in 8 oz liquid and drink    potassium  chloride SA (K-DUR,KLOR-CON)  20 MEQ tablet Take 20-40 mEq by mouth 2 (two) times daily. Take 2 tablets (40 meq) by mouth every morning and 1 tablet (20 meq) at night    traZODone (DESYREL) 50 MG tablet 2-4 tabs po qhs prn insomnia Qty: 120 tablet, Refills: 6     !! - Potential duplicate medications found. Please discuss with provider.         Outstanding Labs/Studies   None  Duration of Discharge Encounter   Greater than 30 minutes including physician time.  Signed, Kortlynn Poust PA-C 04/11/2016, 3:34 PM

## 2016-04-11 NOTE — Evaluation (Signed)
Occupational Therapy Evaluation Patient Details Name: Linda [name] MRN: [id number] DOB: 14-Mar-1946 Today's Date: 04/11/2016    History of Present Illness Adm with CHF exacerbation (incr edema). PMHx-CHF, COPD, fibromyalgia, OA, PE, orthostatic hypotension. anxiety   Clinical Impression   Patient presenting with decreased ADL and functional mobility independence. Patient mod I PTA. Patient currently functioning at an overall supervision to min guard/asisst level. Patient will benefit from acute OT to increase overall independence in the areas of ADLs, functional mobility, and overall safety in order to safely discharge back to her independent living apartment. At this time, am recommending assistance/supervision during ADLs and functional mobility for patient's safety.      Follow Up Recommendations  No OT follow up;Supervision/Assistance - 24 hour    Equipment Recommendations  None recommended by OT    Recommendations for Other Services  None at this time   Precautions / Restrictions Precautions Precautions: Fall Restrictions Weight Bearing Restrictions: No    Mobility Bed Mobility - Per PT note Overal bed mobility: Modified Independent General bed mobility comments: including manipulating linens  Transfers Overall transfer level: Needs assistance Equipment used: Rolling walker (2 wheeled) Transfers: Sit to/from Stand Sit to Stand: Supervision;Min guard         General transfer comment: Min guard due to weakness and pain in bilateral knees. Pt required cueing to use RW, pt originally wanted to ambulate without RW.     Balance Overall balance assessment: Modified Independent    ADL Overall ADL's : Needs assistance/impaired Eating/Feeding: Set up;Sitting   Grooming: Set up;Supervision/safety;Min guard;Standing   Upper Body Bathing: Set up;Sitting   Lower Body Bathing: Minimal assistance;Sit to/from stand   Upper Body Dressing : Set  up;Supervision/safety;Sitting   Lower Body Dressing: Minimal assistance;Sit to/from stand   Toilet Transfer: Min guard;Supervision/safety;RW;Comfort height toilet;Ambulation   Toileting- Clothing Manipulation and Hygiene: Supervision/safety;Sitting/lateral lean     Tub/Shower Transfer Details (indicate cue type and reason): did not occur   General ADL Comments: Pt took increased time to complete ADL. Pt bending over for LB ADLs, therapist encouraged to cross ankles over knees to prevent from constricting lungs OR if she didn't feel comfortable doing this at least taking breaks to give her lungs ability to expand to breathe better. Pts 02 sats remained greater than 90% entire session on 3L/min supplemental 02.    Vision Vision Assessment?: No apparent visual deficits          Pertinent Vitals/Pain Pain Assessment: Faces Faces Pain Scale: Hurts even more Pain Location: bilateral knees Pain Descriptors / Indicators: Aching;Sore;Grimacing Pain Intervention(s): Limited activity within patient's tolerance;Monitored during session;Repositioned     Hand Dominance Right   Extremity/Trunk Assessment Upper Extremity Assessment Upper Extremity Assessment: Generalized weakness   Lower Extremity Assessment Lower Extremity Assessment: Defer to PT evaluation   Cervical / Trunk Assessment Cervical / Trunk Assessment: Kyphotic   Communication Communication Communication: No difficulties   Cognition Arousal/Alertness: Awake/alert Behavior During Therapy: Flat affect Overall Cognitive Status: Within Functional Limits for tasks assessed              Home Living Family/patient expects to be discharged to:: Private residence Living Arrangements: Alone Available Help at Discharge: Family;Friend(s);Personal care attendant;Available PRN/intermittently (pt can ask for aide time prn (per patient) ) Type of Home: Independent living facility Home Access: Level entry     Home Layout: One  level     Bathroom Shower/Tub: Occupational psychologist: Handicapped height     Home Equipment: Radio producer -  single point;Shower seat;Grab bars - toilet;Grab bars - tub/shower;Hand held shower head;Walker - 4 wheels;Other (comment);Wheelchair - Brewing technologist - built in   Prior Functioning/Environment Level of Independence: Needs assistance  Gait / Transfers Assistance Needed: using rollator inside; takes manual w/c with others pushing when she goes out    OT Diagnosis: Generalized weakness   OT Problem List: Decreased strength;Decreased activity tolerance;Decreased safety awareness;Decreased knowledge of use of DME or AE;Decreased knowledge of precautions;Pain   OT Treatment/Interventions: Self-care/ADL training;Therapeutic exercise;Energy conservation;DME and/or AE instruction;Therapeutic activities;Patient/family education    OT Goals(Current goals can be found in the care plan section) Acute Rehab OT Goals Patient Stated Goal: go home as independent as possible OT Goal Formulation: With patient Time For Goal Achievement: 04/18/16 Potential to Achieve Goals: Good ADL Goals Pt Will Perform Grooming: with modified independence;standing Pt Will Perform Lower Body Bathing: with modified independence;sit to/from stand Pt Will Perform Lower Body Dressing: with modified independence;sit to/from stand Pt Will Transfer to Toilet: with modified independence;ambulating;bedside commode Pt Will Perform Tub/Shower Transfer: Shower transfer;ambulating;rolling walker;shower seat;with modified independence Additional ADL Goal #1: Pt will be mod I with functional ambulation/mobility during ADL   OT Frequency: Min 2X/week   Barriers to D/C: Decreased caregiver support    End of Session Equipment Utilized During Treatment: Rolling walker;Oxygen  Activity Tolerance: Patient tolerated treatment well Patient left: in chair;with call bell/phone within reach;with chair alarm set   Time:  1405-1430 OT Time Calculation (min): 25 min Charges:  OT Evaluation $OT Eval Moderate Complexity: 1 Procedure OT Treatments $Self Care/Home Management : 8-22 mins  Chrys Racer , MS, OTR/L, CLT Pager: [phone]  04/11/2016, 3:04 PM

## 2016-04-11 NOTE — Progress Notes (Signed)
Patient Name: Linda [name] Date of Encounter: 04/11/2016   SUBJECTIVE  Breathing improved. Complains of weak and tried.   CURRENT MEDS . ARIPiprazole  10 mg Oral Daily  . aspirin EC  81 mg Oral Daily  . atorvastatin  20 mg Oral QHS  . DULoxetine  90 mg Oral QHS  . folic acid  2 mg Oral Daily  . furosemide  40 mg Oral Daily  . heparin  5,000 Units Subcutaneous 3 times per day  . isosorbide mononitrate  30 mg Oral Daily  . mometasone-formoterol  2 puff Inhalation BID  . multivitamin with minerals  1 tablet Oral Daily  . nebivolol  5 mg Oral Daily  . pantoprazole  40 mg Oral Daily  . polyvinyl alcohol  1 drop Both Eyes Daily  . potassium chloride  20 mEq Oral QHS  . potassium chloride  40 mEq Oral q morning - 10a  . pregabalin  300 mg Oral BID  . sodium chloride flush  3 mL Intravenous Q12H    OBJECTIVE  Filed Vitals:   04/10/16 2104 04/10/16 2202 04/11/16 0551 04/11/16 1025  BP:  125/61 108/65   Pulse:  76 70   Temp:  97.9 F (36.6 C) 97 F (36.1 C)   TempSrc:  Oral Oral   Resp:  18 18   Height:      Weight:   168 lb 6.4 oz (76.386 kg)   SpO2: 95% 98% 99% 96%    Intake/Output Summary (Last 24 hours) at 04/11/16 1507 Last data filed at 04/11/16 1354  Gross per 24 hour  Intake   1023 ml  Output   1050 ml  Net    -27 ml   Filed Weights   04/09/16 0602 04/10/16 0700 04/11/16 0551  Weight: 170 lb 6.4 oz (77.293 kg) 167 lb 8.8 oz (76 kg) 168 lb 6.4 oz (76.386 kg)    PHYSICAL EXAM  General: Pleasant, NAD. Neuro: Alert and oriented X 3. Moves all extremities spontaneously. Psych: Normal affect. HEENT:  Normal  Neck: Supple without bruits or JVD. Lungs:  Resp regular and unlabored. Faint crackles at bases.  Heart: RRR no s3, s4, or murmurs. Abdomen: Soft, non-tender, non-distended, BS + x 4.  Extremities: No clubbing, cyanosis or edema. DP/PT/Radials 2+ and equal bilaterally.  Accessory Clinical Findings  CBC  Recent Labs  04/08/16 1800  WBC  6.1  NEUTROABS 3.3  HGB 13.9  HCT 42.5  MCV 89.5  PLT Q000111Q*   Basic Metabolic Panel  Recent Labs  04/08/16 1800  04/10/16 0316 04/11/16 0229  NA 142  < > 141 141  K 4.1  < > 3.9 4.1  CL 101  < > 99* 100*  CO2 31  < > 31 29  GLUCOSE 91  < > 104* 122*  BUN 11  < > 16 14  CREATININE 0.94  < > 0.93 0.85  CALCIUM 9.5  < > 9.4 9.2  MG 2.0  --   --   --   < > = values in this interval not displayed. Liver Function Tests  Recent Labs  04/08/16 1800  AST 18  ALT 13*  ALKPHOS 73  BILITOT 1.0  PROT 6.8  ALBUMIN 3.7   No results for input(s): LIPASE, AMYLASE in the last 72 hours. Cardiac Enzymes  Recent Labs  04/08/16 1800 04/08/16 2245 04/09/16 0513  TROPONINI <0.03 <0.03 <0.03   Thyroid Function Tests  Recent Labs  04/08/16 1800  TSH 1.002  TELE  Sinus rhythm  Radiology/Studies  Dg Chest Port 1 View  04/09/2016  CLINICAL DATA:  Shortness of Breath EXAM: PORTABLE CHEST 1 VIEW COMPARISON:  Chest CT January 01, 2016; chest radiograph February 01, 2016 FINDINGS: Bilateral calcified pleural plaques are again noted without appreciable change. There is no edema or consolidation. Heart is upper normal in size with pulmonary vascularity within normal limits. No adenopathy. There is atherosclerotic calcification in the aorta. IMPRESSION: Multiple calcified pleural plaques, unchanged. No edema or consolidation. Stable cardiac silhouette. Electronically Signed   By: Lowella Grip III M.D.   On: 04/09/2016 08:05    ASSESSMENT AND PLAN   1# Acute on chronic diastolic heart failure:  . BNP was normal and no edema was seen on CXR. Renal function initially worsened with diuresis but has normalized after holding IV lasix. Then remained normal on lasix 40 mg po daily. BP is well-controlled. Continue nebivolol and imdur. Dr. Oval Linsey  suspected that much of her shortness of breath is attributed to her asbestosis.   2 # Hypertension: Continue nebivolol. BP  well-controlled.   3 # CAD Continue aspirin, nebivolol and atovastatin.  4 # hyperlipidemia: Continue stain.   # Dispo: Offered PT/OT, but she states she already gets PT/OT 3 times per week at home. Will continue at home. Denies evaluation for SNF. She lives by her self and oxygen at home.     Jarrett Soho PA-C Pager [phone]

## 2016-04-11 NOTE — Care Management Important Message (Signed)
Important Message  Patient Details  Name: Linda [name] MRN: [id number] Date of Birth: [birth year]   Medicare Important Message Given:  Yes    Omolola Mittman P Royce Sciara 04/11/2016, 4:11 PM

## 2016-04-11 NOTE — Evaluation (Addendum)
Physical Therapy Evaluation and Discharge Patient Details Name: Linda [name] MRN: [id number] DOB: [birth year] Today's Date: 04/11/2016   History of Present Illness  Adm with CHF exacerbation (incr edema). PMHx-CHF, COPD, fibromyalgia, OA, PE, orthostatic hypotension. anxiety  Clinical Impression  Pt admitted with above diagnosis. Pt currently with functional limitations due to the deficits listed below (see PT Problem List). Patient is generally deconditioned, needs further education for safe use of DME (although was assessed with 2 wheel walker when she is accustomed to 4 wheel), and home safety evaluation. Pt will benefit from skilled HHPT to increase their independence and safety with mobility.        Follow Up Recommendations Home health PT;Supervision for mobility/OOB    Equipment Recommendations  None recommended by PT    Recommendations for Other Services       Precautions / Restrictions Precautions Precautions: Fall      Mobility  Bed Mobility Overal bed mobility: Modified Independent             General bed mobility comments: including manipulating linens  Transfers Overall transfer level: Needs assistance Equipment used: Rolling walker (2 wheeled) Transfers: Sit to/from Stand Sit to Stand: Supervision         General transfer comment: vc for safe use of RW (pt accustomed to rollator with ability to lock wheels)  Ambulation/Gait Ambulation/Gait assistance: Supervision Ambulation Distance (Feet): 80 Feet Assistive device: Rolling walker (2 wheeled) Gait Pattern/deviations: Step-through pattern;Decreased stride length;Trunk flexed Gait velocity: very slow Gait velocity interpretation: Below normal speed for age/gender General Gait Details: steady and maintains straight path  Stairs            Wheelchair Mobility    Modified Rankin (Stroke Patients Only)       Balance Overall balance assessment: Modified Independent (in/out bathroom;  at sink)                                           Pertinent Vitals/Pain Pain Assessment: Faces Faces Pain Scale: Hurts even more Pain Location: both knees Pain Descriptors / Indicators: Aching Pain Intervention(s): Limited activity within patient's tolerance;Monitored during session;Repositioned    Home Living Family/patient expects to be discharged to:: Private residence Living Arrangements: Alone Available Help at Discharge: Family;Friend(s);Personal care attendant;Available PRN/intermittently (pt can ask for incr aide time (per pt)) Type of Home: Independent living facility Home Access: Level entry     Home Layout: One level Home Equipment: Cane - single point;Shower seat;Grab bars - toilet;Grab bars - tub/shower;Hand held shower head;Walker - 4 wheels;Other (comment);Wheelchair - manual      Prior Function Level of Independence: Needs assistance   Gait / Transfers Assistance Needed: using rollator inside; takes manual w/c with others pushing when she goes out           Hand Dominance   Dominant Hand: Right    Extremity/Trunk Assessment   Upper Extremity Assessment: Defer to OT evaluation           Lower Extremity Assessment: Generalized weakness (moves slowly due to bil knee arthritis)      Cervical / Trunk Assessment: Kyphotic  Communication   Communication: No difficulties  Cognition Arousal/Alertness: Awake/alert Behavior During Therapy: Flat affect Overall Cognitive Status: Within Functional Limits for tasks assessed                      General  Comments      Exercises        Assessment/Plan    PT Assessment All further PT needs can be met in the next venue of care  PT Diagnosis Difficulty walking;Generalized weakness   PT Problem List Decreased strength;Decreased activity tolerance;Decreased mobility;Decreased knowledge of use of DME;Pain;Obesity  PT Treatment Interventions     PT Goals (Current goals can be  found in the Care Plan section) Acute Rehab PT Goals Patient Stated Goal: go home as independent as possible PT Goal Formulation: All assessment and education complete, DC therapy    Frequency     Barriers to discharge        Co-evaluation               End of Session Equipment Utilized During Treatment: Gait belt;Oxygen Activity Tolerance: Patient limited by fatigue;Patient limited by pain (as she would be PTA) Patient left: in chair;with call bell/phone within reach;with chair alarm set           Time: 5521-7471 PT Time Calculation (min) (ACUTE ONLY): 37 min   Charges:   PT Evaluation $PT Eval Moderate Complexity: 1 Procedure PT Treatments $Gait Training: 8-22 mins   PT G Codes:        Tristen Pennino 04-24-16, 1:55 PM Pager [phone]

## 2016-04-12 ENCOUNTER — Other Ambulatory Visit: Payer: Self-pay | Admitting: *Deleted

## 2016-04-12 DIAGNOSIS — R0602 Shortness of breath: Secondary | ICD-10-CM | POA: Insufficient documentation

## 2016-04-12 DIAGNOSIS — J61 Pneumoconiosis due to asbestos and other mineral fibers: Secondary | ICD-10-CM

## 2016-04-12 NOTE — Care Management Note (Addendum)
Case Management Note  Patient Details  Name: Linda [name] MRN: [id number] Date of Birth: 08-16-1946  Subjective/Objective:  Pt admitted with acute on chronic CHF                  Action/Plan:  Pt is independent from [place], pt states she periodically uses walker.  Pt has PT, OT 5 days a week provided by [facility] [facility] agency.    arranged through [facility] and will plans to continue.  Pt states she weighs herself daily and adheres to low salt diet.  CM ordered PT/OT evaluation.  Pt stated that if there is a need for supervision post discharge she can arrange it with Alfredo Bach as she has done in the past.  Pt on home O2 3 Liters through [facility].  CM will continue to monitor for disposition needs   Expected Discharge Date:                  Expected Discharge Plan:  Home/Self Care  In-House Referral:     Discharge planning Services  CM Consult  Post Acute Care Choice:    Choice offered to:  Patient  DME Arranged:    DME Agency:     HH Arranged:  PT, OT HH Agency:   Secondary school teacher with [place])  Status of Service:  In process, will continue to follow  Medicare Important Message Given:  Yes Date Medicare IM Given:    Medicare IM give by:    Date Additional Medicare IM Given:    Additional Medicare Important Message give by:     If discussed at [place] of Stay Meetings, dates discussed:    Additional Comments: 04/12/2016 Attending wrote [facility] orders post discharge - CM spoke with Olena Leatherwood at [facility] - instructed nurse to fax order to [phone] attention [facility].  Required documents faxed as requested.  04/11/16 Pt discharged home on 3 Liters.  CM requested bedside nurse to obtain [facility] order for resumption order  Maryclare Labrador, RN 04/12/2016, 1:22 PM

## 2016-04-12 NOTE — Patient Outreach (Signed)
[facility]) Care Management  04/12/2016  [name] 24-Jun-1946 [id number]  Transition of care (week 1)  RN spoke with pt and introduced the [facility] program and services. RN also explained the purpose of today's call and inquired further on her ongoing recovery. Pt indicates [name] already involved and aware of her recent hospitalization due to a missed appointment. Pt indicates she continues to drive to her primary doctor's appointment but uses the facility's transportation at times. Pt receptive to the [facility] services and provided information to complete the transition of care template. RN offered an extension of [facility] services with a home visit to educate her further on her HF. Pt receptive and RN able to verify pt prefers printed material for visual aide in learning more about her medical condition. Will scheduled the initial home visit according to pt's scheduled appointments.   [name], RN Care Management Coordinator [place] Office [phone]

## 2016-04-15 ENCOUNTER — Other Ambulatory Visit: Payer: Self-pay | Admitting: Hematology & Oncology

## 2016-04-15 DIAGNOSIS — M170 Bilateral primary osteoarthritis of knee: Secondary | ICD-10-CM | POA: Diagnosis not present

## 2016-04-15 DIAGNOSIS — M25562 Pain in left knee: Secondary | ICD-10-CM | POA: Diagnosis not present

## 2016-04-15 DIAGNOSIS — M67911 Unspecified disorder of synovium and tendon, right shoulder: Secondary | ICD-10-CM | POA: Diagnosis not present

## 2016-04-15 DIAGNOSIS — M25561 Pain in right knee: Secondary | ICD-10-CM | POA: Diagnosis not present

## 2016-04-15 DIAGNOSIS — M25511 Pain in right shoulder: Secondary | ICD-10-CM | POA: Diagnosis not present

## 2016-04-16 ENCOUNTER — Telehealth: Payer: Self-pay | Admitting: Cardiology

## 2016-04-16 DIAGNOSIS — R262 Difficulty in walking, not elsewhere classified: Secondary | ICD-10-CM | POA: Diagnosis not present

## 2016-04-16 NOTE — Telephone Encounter (Signed)
Spoke with pt, she has gained 7 lbs in the last 2 days. She has swelling in her feet, ankles and legs. She has SOB with any activity. Pt instructed to increase furosemide to 40 mg twice daily x 3 days then she will decrease back to once daily. She will call after 3 days if no change or worsening symptoms.

## 2016-04-16 NOTE — Telephone Encounter (Signed)
New message    Pt c/o swelling: STAT is pt has developed SOB within 24 hours  1. How long have you been experiencing swelling? 5 nights  2. Where is the swelling located? Pt gained 7 pounds feet and ankles 3.  Are you currently taking a "fluid pill"? Yes 40mg   4.  Are you currently SOB? yes 5.  Have you traveled recently? no

## 2016-04-16 NOTE — Telephone Encounter (Signed)
Returned call to Van Clines PT with [employer].Patient spoke to Dr.Crenshaw's nurse Fredia Beets RN earlier today about weight gain.She was advised to increase lasix to 40 mg twice a day for 3 days then resume normal dose 40 mg daily.Advised to call back if she continues to have swelling.Stated she will hold off on PT until swelling improves.Stated she needed a order from [facility] for patient to receive home health nursing couple times a week and for home PT and home occupational therapy.Fax order attention [facility] to fax # [phone].Message sent to [facility] and Hilda Blades.

## 2016-04-16 NOTE — Telephone Encounter (Signed)
Agree with plan; ok for home therapy/nursing [name]

## 2016-04-16 NOTE — Telephone Encounter (Signed)
New message  Please call back to discuss the perimeters for therapy? Per [place], the pt has gained 7 lbs in two days, would the provider prefer her to hold off from therapy and for how long.

## 2016-04-16 NOTE — Telephone Encounter (Signed)
This note will be faxed to the number provided. 

## 2016-04-18 ENCOUNTER — Telehealth: Payer: Self-pay | Admitting: Cardiology

## 2016-04-18 DIAGNOSIS — I5033 Acute on chronic diastolic (congestive) heart failure: Secondary | ICD-10-CM | POA: Diagnosis not present

## 2016-04-18 DIAGNOSIS — I110 Hypertensive heart disease with heart failure: Secondary | ICD-10-CM | POA: Diagnosis not present

## 2016-04-18 DIAGNOSIS — G8929 Other chronic pain: Secondary | ICD-10-CM | POA: Diagnosis not present

## 2016-04-18 DIAGNOSIS — I2510 Atherosclerotic heart disease of native coronary artery without angina pectoris: Secondary | ICD-10-CM | POA: Diagnosis not present

## 2016-04-18 DIAGNOSIS — F329 Major depressive disorder, single episode, unspecified: Secondary | ICD-10-CM | POA: Diagnosis not present

## 2016-04-18 DIAGNOSIS — M797 Fibromyalgia: Secondary | ICD-10-CM | POA: Diagnosis not present

## 2016-04-18 NOTE — Telephone Encounter (Signed)
Spoke with carol, verbal order given

## 2016-04-18 NOTE — Telephone Encounter (Signed)
[name] calling to let you know that she has seen the patient today and is needing a Verbal  Order to continue Management of her CHF for 1wk1, 2wk3 and1wk5 as well as Tele- monitoring equipment , Pusle ox , Bp Cuff and a Scale . Please call   Thanks

## 2016-04-18 NOTE — Telephone Encounter (Signed)
Message routed to Dr. Stanford Breed to get OK to continue services denoted below

## 2016-04-18 NOTE — Telephone Encounter (Signed)
[place] for services [employer]

## 2016-04-19 ENCOUNTER — Other Ambulatory Visit: Payer: Self-pay | Admitting: *Deleted

## 2016-04-19 NOTE — Patient Outreach (Signed)
[facility]) Care Management   04/19/2016  Linda [name] Nov 15, 1946 [id number]  Linda [name] is an 70 y.o. female  Subjective:  HF: Pt reports she has been weigh in daily and has an issues with weight gained on Sunday. Pt called her provider on Monday and informed to take 3 days of extra medication to reduce her ongoing swelling. Pt states she finished the third day on the extra fluid medication and feeling a lot better with no additional symptoms. Verified she is in the GREEN after further review of the HF zones. Pt also states [name]) will be provide a scale for monitoring her weight, BP and saturation. Last visit yesterday and pt pending a call back on the next home visit. This was ordered by Dr. Stanford Breed. Pt receptive to education printed material that will be discussed today.  APPOINTMENTS: Pt states she continues to follow up with all appointments using the facility's transportation services however pt states she continues to drive to one provider the facility does not transport to that area.  No delays as pt reports no missed appointments and aware she needs to contact her primary for a post op office visit however states Dr. Stanford Breed is fully aware of her issues at this time.  Pt denies any other community resources needed at this time and very grateful for the services rendered today. States she has children who are very supportive and contacts her regularly concerning her health. Pt again receptive to the plan of care and goals discussed today and allow RN to schedule next month's home visit.    Objective:   Review of Systems  Constitutional: Negative.   HENT: Negative.   Eyes: Negative.   Respiratory: Negative.   Cardiovascular: Negative.   Gastrointestinal: Negative.   Genitourinary: Negative.        Pt has frequent UTI with available treatment if needed.  Musculoskeletal: Negative.   Skin: Negative.   Neurological: Negative.    Endo/Heme/Allergies: Negative.   Psychiatric/Behavioral: Negative.     Physical Exam  Constitutional: She is oriented to person, place, and time. She appears well-developed and well-nourished.  HENT:  Right Ear: External ear normal.  Left Ear: External ear normal.  Eyes: EOM are normal.  Neck: Normal range of motion.  Cardiovascular: Normal heart sounds.   Respiratory: Effort normal and breath sounds normal.  GI: Soft. Bowel sounds are normal.  Musculoskeletal: Normal range of motion.  Neurological: She is alert and oriented to person, place, and time.  Skin: Skin is warm and dry.  Psychiatric: She has a normal mood and affect. Her behavior is normal. Judgment and thought content normal.    Encounter Medications:   Outpatient Encounter Prescriptions as of 04/19/2016  Medication Sig Note  . albuterol (PROVENTIL HFA;VENTOLIN HFA) 108 (90 BASE) MCG/ACT inhaler Inhale 2 puffs into the lungs every 6 (six) hours as needed for wheezing or shortness of breath.   . ALPRAZolam (XANAX) 1 MG tablet Take 1 tablet (1 mg total) by mouth 3 (three) times daily as needed for anxiety. (Patient taking differently: Take 1 mg by mouth See admin instructions. Take 1 tablet (1 mg) by mouth twice daily, may take a 3rd tablet as needed for anxiety)   . ARIPiprazole (ABILIFY) 10 MG tablet Take 1 tablet (10 mg total) by mouth daily.   [name] Kitchen aspirin 81 MG EC tablet Take 1 tablet (81 mg total) by mouth daily. (Patient taking differently: Take 81 mg by mouth at bedtime. )   .  atorvastatin (LIPITOR) 20 MG tablet Take 20 mg by mouth at bedtime.   . budesonide-formoterol (SYMBICORT) 160-4.5 MCG/ACT inhaler Inhale 2 puffs into the lungs 2 (two) times daily.   [name] Kitchen BYSTOLIC 5 MG tablet TAKE 1 TABLET ONCE DAILY.   [name] Kitchen Cholecalciferol (VITAMIN D3) 50000 units CAPS Take 1 capsule by mouth every Monday.    . diclofenac sodium (VOLTAREN) 1 % GEL Apply 1 application topically 3 (three) times daily as needed (pain).    . DULoxetine  (CYMBALTA) 30 MG capsule TAKE 1 CAPSULE ONCE DAILY ALONG WITH 60MG  CAPSULE FOR TOTAL 90MG  DAILY. (Patient taking differently: TAKE 1 CAPSULE BY MOUTH DAILY AT BEDTIME - WITH A 60 MG CAPSULE FOR A 90 MG DOSE)   . DULoxetine (CYMBALTA) 60 MG capsule Take 60 mg by mouth at bedtime. Take with a 30 mg capsule for a 90 mg dose   . folic acid (FOLVITE) 1 MG tablet Take 2 tablets (2 mg total) by mouth daily.   . furosemide (LASIX) 40 MG tablet Take 40 mg by mouth daily.   [name] Kitchen ibuprofen (ADVIL,MOTRIN) 800 MG tablet Take 800 mg by mouth daily as needed (pain).   . isosorbide mononitrate (IMDUR) 30 MG 24 hr tablet TAKE 1 TABLET ONCE DAILY.   [name] Kitchen LYRICA 300 MG capsule TAKE (1) CAPSULE TWICE DAILY.   [name] Kitchen Misc Natural Products (OSTEO BI-FLEX ADV JOINT SHIELD) TABS Take 1 tablet by mouth daily. 04/08/2016: Pt ran out 3 weeks ago but plans to purchase more and resume taking  . Multiple Vitamin (MULTIVITAMIN WITH MINERALS) TABS tablet Take 1 tablet by mouth daily.   . nitroGLYCERIN (NITROSTAT) 0.4 MG SL tablet Place 0.4 mg under the tongue every 5 (five) minutes as needed for chest pain (x 3 doses).    . ondansetron (ZOFRAN-ODT) 4 MG disintegrating tablet Take 4 mg by mouth every 8 (eight) hours as needed for nausea or vomiting.   [name] Kitchen oxyCODONE-acetaminophen (PERCOCET) 10-325 MG tablet Take 1 tablet by mouth every 8 (eight) hours as needed for pain. ONE MONTH SUPPLY   . OXYGEN Inhale 3 L into the lungs continuous.   . pantoprazole (PROTONIX) 40 MG tablet Take 1 tablet (40 mg total) by mouth daily.   . polyethylene glycol (MIRALAX / GLYCOLAX) packet Take 17 g by mouth daily as needed (constipation). Mix in 8 oz liquid and drink   . potassium chloride SA (K-DUR,KLOR-CON) 20 MEQ tablet Take 20-40 mEq by mouth 2 (two) times daily. Take 2 tablets (40 meq) by mouth every morning and 1 tablet (20 meq) at night   . traZODone (DESYREL) 50 MG tablet 2-4 tabs po qhs prn insomnia (Patient taking differently: Take 150-200 mg by mouth at  bedtime. )    Facility-Administered Encounter Medications as of 04/19/2016  Medication  . ferumoxytol (FERAHEME) 510 mg in sodium chloride 0.9 % 100 mL IVPB    Functional Status:   In your present state of health, do you have any difficulty performing the following activities: 04/19/2016 04/09/2016  Hearing? N N  Vision? N N  Difficulty concentrating or making decisions? N N  Walking or climbing stairs? Y Y  Dressing or bathing? N N  Doing errands, shopping? [place]pie Donning  Preparing Food and eating ? N -  Using the Toilet? N -  In the past six months, have you accidently leaked urine? Y -  Do you have problems with loss of bowel control? N -  Managing your Medications? N -  Managing your Finances? N -  Housekeeping or managing your Housekeeping? Y -   BP 112/64 mmHg  Pulse 66  Resp 20  Ht 1.626 m (5\' 4" )  Wt 171 lb (77.565 kg)  BMI 29.34 kg/m2  SpO2 98% Fall/Depression Screening:    PHQ 2/9 Scores 04/19/2016 08/11/2015 07/12/2015 03/27/2015 03/31/2014  PHQ - 2 Score 0 6 1 6  0  PHQ- 9 Score - 13 - 17 -    Assessment:   Case management related to HF Adherence related to medical appointments Post-op contact with primary provider  Plan:  Will completed a physical assessment and verified pt's enrollment into the [facility] program and services. Will continue teachings on HF and review the HF zones and verified pt remains in the GREEN zone. Will verified pt has completed the recommended medications doses over the last three days for her fuild retention as prescribed by her provider. Will verified extra retention of fluid has resolved with no addition problems since completing the extra dosage. Will verified no SOB or swelling noted and that pt is aware to elevate her legs with any swelling that is encountered. Will also verified the involved services with [name] and again encouraged pt to particpate with the tele-o-monitoring scales (HF), blood pressures and pulse for O2 saturation once program is started.  Pt aware to continue documenting all weights for other providers to view when being via office visits. In addition to Nurse, adult also provided to pt for the following: HF-Keeping track of your weight each day, HF-How to be salt smart and HF-When to call your doctor or 911. [facility] calendar was also provided for daily documentation of her weights. Will verify pt is attending all scheduled appointment with provided transportation at the facility she currently resides [facility]). Note pt is able to drive if needed and drives to one of her provider due to the distance as the facility does not travel that far.  Will strongly encouraged pt to contact her primary provider concerning her recent hospitalization if pt needs a post-op office visit. Will verify pt understanding the importance of this post-op visit for possible changes to her medications or treatment plan.  Plan of care and goals reiterated and RN strongly encouraged pt's participation in managing her care more independently. Will schedule the next home visit accordingly.  [name], RN Care Management Coordinator [place] Office [phone]

## 2016-04-22 ENCOUNTER — Encounter: Payer: Self-pay | Admitting: *Deleted

## 2016-04-22 DIAGNOSIS — I5033 Acute on chronic diastolic (congestive) heart failure: Secondary | ICD-10-CM | POA: Diagnosis not present

## 2016-04-22 DIAGNOSIS — M797 Fibromyalgia: Secondary | ICD-10-CM | POA: Diagnosis not present

## 2016-04-22 DIAGNOSIS — I110 Hypertensive heart disease with heart failure: Secondary | ICD-10-CM | POA: Diagnosis not present

## 2016-04-22 DIAGNOSIS — I2510 Atherosclerotic heart disease of native coronary artery without angina pectoris: Secondary | ICD-10-CM | POA: Diagnosis not present

## 2016-04-22 DIAGNOSIS — F329 Major depressive disorder, single episode, unspecified: Secondary | ICD-10-CM | POA: Diagnosis not present

## 2016-04-22 DIAGNOSIS — G8929 Other chronic pain: Secondary | ICD-10-CM | POA: Diagnosis not present

## 2016-04-24 DIAGNOSIS — G8929 Other chronic pain: Secondary | ICD-10-CM | POA: Diagnosis not present

## 2016-04-24 DIAGNOSIS — I5033 Acute on chronic diastolic (congestive) heart failure: Secondary | ICD-10-CM | POA: Diagnosis not present

## 2016-04-24 DIAGNOSIS — I110 Hypertensive heart disease with heart failure: Secondary | ICD-10-CM | POA: Diagnosis not present

## 2016-04-24 DIAGNOSIS — I2510 Atherosclerotic heart disease of native coronary artery without angina pectoris: Secondary | ICD-10-CM | POA: Diagnosis not present

## 2016-04-24 DIAGNOSIS — M797 Fibromyalgia: Secondary | ICD-10-CM | POA: Diagnosis not present

## 2016-04-24 DIAGNOSIS — F329 Major depressive disorder, single episode, unspecified: Secondary | ICD-10-CM | POA: Diagnosis not present

## 2016-04-25 ENCOUNTER — Telehealth: Payer: Self-pay | Admitting: Cardiology

## 2016-04-25 DIAGNOSIS — M797 Fibromyalgia: Secondary | ICD-10-CM | POA: Diagnosis not present

## 2016-04-25 DIAGNOSIS — I5033 Acute on chronic diastolic (congestive) heart failure: Secondary | ICD-10-CM | POA: Diagnosis not present

## 2016-04-25 DIAGNOSIS — I110 Hypertensive heart disease with heart failure: Secondary | ICD-10-CM | POA: Diagnosis not present

## 2016-04-25 DIAGNOSIS — I2510 Atherosclerotic heart disease of native coronary artery without angina pectoris: Secondary | ICD-10-CM | POA: Diagnosis not present

## 2016-04-25 DIAGNOSIS — F329 Major depressive disorder, single episode, unspecified: Secondary | ICD-10-CM | POA: Diagnosis not present

## 2016-04-25 DIAGNOSIS — G8929 Other chronic pain: Secondary | ICD-10-CM | POA: Diagnosis not present

## 2016-04-25 NOTE — Telephone Encounter (Signed)
Returned [employer] Per pt, main c/o headache.  Patient not concerned about SOB which is usual for her. This was 2nd visit for [facility].   Advised if SOB worse or CP, ED evaluation. Advised PCP referral if more suspicious for URI.  Advised OTC or pt's PRN pain meds for her headache, call PCP if not better. No apparent fluid/weight gain, but advised OK to do extra lasix today & if it helps to notify us. Caller voiced understanding.

## 2016-04-25 NOTE — Telephone Encounter (Signed)
New message    Pt c/o Shortness Of Breath: STAT if SOB developed within the last 24 hours or pt is noticeably SOB on the phone  1. Are you currently SOB (can you hear that pt is SOB on the phone)? Yes-per RN [place]( at the pt home now)  2. How long have you been experiencing SOB? Per RN Kellie(home health) since 0730 this am   3. Are you SOB when sitting or when up moving around? This am not moving around but now she can move per RN Lambert Keto (home health nurse)  4. Are you currently experiencing any other symptoms? Sob and chest discomfort and o2sat is 96% on 3 liters now

## 2016-04-26 ENCOUNTER — Other Ambulatory Visit: Payer: Self-pay | Admitting: *Deleted

## 2016-04-26 ENCOUNTER — Telehealth: Payer: Self-pay | Admitting: Cardiology

## 2016-04-26 NOTE — Patient Outreach (Signed)
[facility]) Care Management  04/26/2016  CARROLYN HIMELRIGHT 1946/06/25 [id number]  Transition of care  RN spoke with pt today who indicates her BP is elevated at 179/98 with symptoms of a headache, red face, fussiness however not dizzy at this time. Pt states this BP check was before her morning medications. Pt has not contacted her provider with this information and this is 2 hours after her BP was taken. RN inquired if she felt her provider needs to be called and offered to make this contact however pt hesitate. RN requested pt to take her BP once again while on HOLD (reports it's back to her normal baseline at 130/82 and HR 83) but some of her symptoms remain. RN encouraged pt to take medication for her headache and if symptoms do not improve to contact her provided Dr. Stanford Breed CAD with any ongoing distressful symptoms. RN has informed pt if she feels she is unable to make the call to contact the RN to report the symptoms noted above to her provider. Pt with clear understanding as RN educated pt on HTN that may involved signs and symptoms related to a stroke.   RN also inquired on pt's HF with reported weights of 168 lbs today, 169 lbs on yesterday and last weight at 170 lbs with no swelling or related symptoms presented at this time. Pt states she continues to take all her medications with no problems. RN continued to encouraged ongoing adherence in managing her care independently. RN will follow up with ongoing transition of care call next week as pt receptive and agreed.  [name], RN Care Management Coordinator [place] Office [phone]

## 2016-04-26 NOTE — Telephone Encounter (Signed)
Spoke to [place] from [facility] and gave ok for order to PT. Dr Stanford Breed had given ok for home PT on 04/16/16. Gracy will fax over order to be signed by Dr Stanford Breed.   Will route to Dr Stanford Breed and Fredia Beets.

## 2016-04-26 NOTE — Telephone Encounter (Signed)
New message    Linda [name] is calling for an Order for PT to continue to see patient for 1x week for 1 week and 2x a week for 6 weeks

## 2016-04-28 DIAGNOSIS — I10 Essential (primary) hypertension: Secondary | ICD-10-CM | POA: Diagnosis not present

## 2016-04-28 DIAGNOSIS — R42 Dizziness and giddiness: Secondary | ICD-10-CM | POA: Diagnosis not present

## 2016-04-28 DIAGNOSIS — G43011 Migraine without aura, intractable, with status migrainosus: Secondary | ICD-10-CM | POA: Diagnosis not present

## 2016-04-29 ENCOUNTER — Ambulatory Visit: Payer: Medicare Other | Admitting: Cardiology

## 2016-04-29 ENCOUNTER — Ambulatory Visit (INDEPENDENT_AMBULATORY_CARE_PROVIDER_SITE_OTHER): Payer: Medicare Other | Admitting: Family Medicine

## 2016-04-29 ENCOUNTER — Encounter: Payer: Self-pay | Admitting: Family Medicine

## 2016-04-29 ENCOUNTER — Telehealth: Payer: Self-pay | Admitting: Cardiology

## 2016-04-29 VITALS — BP 124/79 | HR 76 | Temp 98.1°F | Resp 20 | Wt 174.2 lb

## 2016-04-29 DIAGNOSIS — F329 Major depressive disorder, single episode, unspecified: Secondary | ICD-10-CM | POA: Diagnosis not present

## 2016-04-29 DIAGNOSIS — I10 Essential (primary) hypertension: Secondary | ICD-10-CM | POA: Diagnosis not present

## 2016-04-29 DIAGNOSIS — M797 Fibromyalgia: Secondary | ICD-10-CM | POA: Diagnosis not present

## 2016-04-29 DIAGNOSIS — I110 Hypertensive heart disease with heart failure: Secondary | ICD-10-CM | POA: Diagnosis not present

## 2016-04-29 DIAGNOSIS — I2510 Atherosclerotic heart disease of native coronary artery without angina pectoris: Secondary | ICD-10-CM | POA: Diagnosis not present

## 2016-04-29 DIAGNOSIS — G8929 Other chronic pain: Secondary | ICD-10-CM | POA: Diagnosis not present

## 2016-04-29 DIAGNOSIS — I5033 Acute on chronic diastolic (congestive) heart failure: Secondary | ICD-10-CM | POA: Diagnosis not present

## 2016-04-29 NOTE — Progress Notes (Signed)
Patient ID: Linda [name], female   DOB: 22-Jun-1946, 70 y.o.   MRN: [id number]    Linda [name] , 04/16/1946, 70 y.o., female MRN: [id number]  CC: Hypertension  Subjective: Pt presents for an acute OV with complaints of elevated BP of 7 days duration. She states over the last  7 days her BP 190/90-106, when she checks her blood pressure upon awakening. She is followed by home monitoring system and cardiology for her cardiac issues. Associated symptoms include headache, which she states hasn't completely resolved in 7 days, but has waxed/waned. She admits to intermittent dizziness, denies syncope or falls. Patient states she is concerned that her blood pressure in the morning is "stroke range " and she is here to prevent a stroke from occurring.  She recently had a hospitalization in April 2017 with cardiology for fluid overload/diastolic CHF. Patient has been in telephone contact with her cardiology's office, Dr. Stanford Breed, with monitoring of her weight gain and Lasix use. She reports no worsening chest pain or current chest pain. She reports improved lower extremity edema, she states normally her ankles or "rolling over her shoes ". She states that she still feels mildly short of breath since her admission to the hospital, but no worsening shortness of breath since her hospital discharge. She reports wearing 3 L nasal cannula for the last few years. She states she currently takes all of her blood pressure medications in the morning and reports compliance with her medications. She currently is on Bystolic 5 mg daily, Lasix 40 mg daily, Imdur  30 mg daily. She reports that her blood pressure lowers within an hour after taking her blood pressure medication in the morning.  She reports she is down to once a day Lasix dosing again, however over the last few weeks she has had to take 2-3 Lasix doses in order to keep her fluid/weight corrected.  Per review of electronic medical medical record, patient has  been in contact with her cardiology's office for all of the above since her discharge from the hospital. She has been set up with home nursing/therapy and encouraged to follow-up with her cardiology's office with weights and blood pressure. If any abnormalities she is to call her cardiology office. Cardiology notes reflects chronic dyspnea and chest tightness. Discharge summary from April 2017 reflect that the majority of her shortness of breath is likely attributed to her asbestosis. Last echocardiogram October 2016 showed normal LV function, grade 1 diastolic dysfunction and trace tricuspid regurg. No mitral regurg. She was seen in the office on 04/08/2016 and then admitted for shortness of breath. This had been after multiple attempts to increase her Lasix, with little result in weight loss. She was given IV Lasix and discharged from the hospital a few days later. Patient has a history of cardiac catheterization in February 2014 which showed nonobstructive disease and normal LV function. Renal Dopplers May 2014 showed no stenosis. Nuclear study March 2016 showed ejection fraction 90% percent and no ischemia or infarct. She has also been admitted October 2016 with acute diastolic congestive heart failure. She has continued her atorvastatin and daily baby aspirin.  Allergies  Allergen Reactions  . Penicillins Itching, Swelling and Rash    Tolerated Cefepime in ED. Has patient had a PCN reaction causing immediate rash, facial/tongue/throat swelling, SOB or lightheadedness with hypotension: Yes Has patient had a PCN reaction causing severe rash involving mucus membranes or skin necrosis: No Has patient had a PCN reaction that required hospitalization: No  Has  patient had a PCN reaction occurring within the last 10 years: No    Social History  Substance Use Topics  . Smoking status: Never Smoker   . Smokeless tobacco: Never Used     Comment: never used tobacco  . Alcohol Use: No   Past Medical  History  Diagnosis Date  . HTN (hypertension)   . Depression   . Recurrent UTI     hx of hospitalization for pyelonephritis; started abx prophylaxis 06/2015  . Hay fever   . Mixed incontinence urge and stress   . Diverticular disease   . Insomnia   . Fibromyalgia     Patient states dx was around her late 29s but she had sx's for years prior to this.  . Syncope     Hypotensive; ED visit--Dr. Terrence Dupont did Cath--nonobstructive CAD, EF 55-60%.  In retrospect, suspect pt rx med misuse/polypharmacy  . Idiopathic angio-edema-urticaria 72014    Angioedema component was very minimal  . Asthma     w/ asbestososis   . History of pneumonia     hospitalized 12/2011, 02/2013, and 07/2013 [facility]) for this  . Anginal pain ([place])     Nonobstructive CAD 2014; however, her cardiologist put her on a statin for this and NOT for hyperlipidemia per pt report.  . OSA on CPAP     prior to move to [place]--had another sleep study 10/2015 w/pulm Dr. Camillo Flaming.  . H/O hiatal hernia   . Migraine syndrome     "not as often anymore; used to be ~ q wk" (07/12/2013)  . Tension headache, chronic   . DDD (degenerative disc disease)     lumbar and cervical.   . Osteoarthritis     "severe; progressing fast" (07/12/2013); multiple joints-not surgical candidate for TKR (03/2015)  . Chronic lower back pain   . Anxiety     panic attacks  . Nephrolithiasis     "passed all on my own or they are still in there" (07/12/2013)  . Pyelonephritis     "several times over the last 30 yr" (07/12/2013)  . Diastolic congestive heart failure ([place])   . COPD (chronic obstructive pulmonary disease) ([place])   . Pulmonary embolism ([place]) 07/2013    Dx at [facility] with very small peripheral upper lobe pe 07/2013: pt took coumadin for about 8-9 mo  . Pleural plaque with presence of asbestos 07/22/2013  . BPPV (benign paroxysmal positional vertigo) 12/16/2012  . RBBB (right bundle branch block)   . Acute upper GI bleed 06/2014    while pt taking coumadin, plavix,  and meloxicam---despite being told not to take coumadin.  . Iron deficiency anemia     Hematologist in [place], [place] did extensive w/u; no cause found; failed oral supplement;; gets fairly regular (q15m or so) IV iron infusions (Venofer -iron sucrose- 200mg  with procrit.  "for 14 yr I've been getting blood work q month & getting infusions prn" (07/12/2013).  Dr. Marin Olp locally, iron infusions done, EPO deficiency dx'd  . Pernicious anemia 08/24/2014   Past Surgical History  Procedure Laterality Date  . Appendectomy  1960  . Total abdominal hysterectomy  1974  . Tendon release  1996    Right forearm and hand  . Knee surgery  2005  . Heel spur surgery Left 2008  . Plantar fascia release Left 2008  . Axillary surgery Left 1978    Multiple "lump" in armpit per pt  . Coccyx removal  1972  . Cardiac catheterization  01/2013  nonobstructive CAD, EF 55-60%  . Transthoracic echocardiogram  01/2013; 04/2014;08/2015    2014--NORMAL.  2015--focal basal septal hypertrophy, EF 55-60%, grade I diast dysfxn, mild LAE.  08/2015 EF 55-60%, nl LV syst fxn, grade I DD, valves wnl  . Dilation and curettage of uterus  ? 1970's  . Eye surgery Left 2012-2013    "injections for ~ 1 yr; don't really know what for" (07/12/2013)  . Spirometry  04/25/14    In hosp for acute asthma/COPD flare: mixed obstructive and restrictive lung disease. The FEV1 is severely reduced at 45% predicted.  FEV1 signif decreased compared to prior spirometry 07/23/13.  [name] Kitchen Esophagogastroduodenoscopy N/A 07/19/2014    Gastritis found + in the setting of supratherapeutic INR, +plavix, + meloxicam.  . Left heart catheterization with coronary angiogram N/A 01/30/2013    Procedure: LEFT HEART CATHETERIZATION WITH CORONARY ANGIOGRAM;  Surgeon: Clent Demark, MD;  Location: [facility] CATH LAB;  Service: Cardiovascular;  Laterality: N/A;  . Cardiovascular stress test  02/22/15    Low risk myocard perf imaging; wall motion normal, normal EF   Family History    Problem Relation Age of Onset  . Arthritis Mother   . Kidney disease Mother   . Heart disease Father   . Stroke Father   . Hypertension Father   . Diabetes Father      Medication List       This list is accurate as of: 04/29/16  1:17 PM.  Always use your most recent med list.               albuterol 108 (90 Base) MCG/ACT inhaler  Commonly known as:  PROVENTIL HFA;VENTOLIN HFA  Inhale 2 puffs into the lungs every 6 (six) hours as needed for wheezing or shortness of breath.     ALPRAZolam 1 MG tablet  Commonly known as:  XANAX  Take 1 tablet (1 mg total) by mouth 3 (three) times daily as needed for anxiety.     ARIPiprazole 10 MG tablet  Commonly known as:  ABILIFY  Take 1 tablet (10 mg total) by mouth daily.     aspirin 81 MG EC tablet  Take 1 tablet (81 mg total) by mouth daily.     atorvastatin 20 MG tablet  Commonly known as:  LIPITOR  Take 20 mg by mouth at bedtime.     budesonide-formoterol 160-4.5 MCG/ACT inhaler  Commonly known as:  SYMBICORT  Inhale 2 puffs into the lungs 2 (two) times daily.     BYSTOLIC 5 MG tablet  Generic drug:  nebivolol  TAKE 1 TABLET ONCE DAILY.     diclofenac sodium 1 % Gel  Commonly known as:  VOLTAREN  Apply 1 application topically 3 (three) times daily as needed (pain).     DULoxetine 60 MG capsule  Commonly known as:  CYMBALTA  Take 60 mg by mouth at bedtime. Take with a 30 mg capsule for a 90 mg dose     DULoxetine 30 MG capsule  Commonly known as:  CYMBALTA  TAKE 1 CAPSULE ONCE DAILY ALONG WITH 60MG  CAPSULE FOR TOTAL 90MG  DAILY.     folic acid 1 MG tablet  Commonly known as:  FOLVITE  Take 2 tablets (2 mg total) by mouth daily.     furosemide 40 MG tablet  Commonly known as:  LASIX  Take 40 mg by mouth daily.     ibuprofen 800 MG tablet  Commonly known as:  ADVIL,MOTRIN  Take 800 mg by mouth  daily as needed (pain).     isosorbide mononitrate 30 MG 24 hr tablet  Commonly known as:  IMDUR  TAKE 1 TABLET ONCE  DAILY.     LYRICA 300 MG capsule  Generic drug:  pregabalin  TAKE (1) CAPSULE TWICE DAILY.     multivitamin with minerals Tabs tablet  Take 1 tablet by mouth daily.     nitroGLYCERIN 0.4 MG SL tablet  Commonly known as:  NITROSTAT  Place 0.4 mg under the tongue every 5 (five) minutes as needed for chest pain (x 3 doses).     ondansetron 4 MG disintegrating tablet  Commonly known as:  ZOFRAN-ODT  Take 4 mg by mouth every 8 (eight) hours as needed for nausea or vomiting.     OSTEO BI-FLEX ADV JOINT SHIELD Tabs  Take 1 tablet by mouth daily.     oxyCODONE-acetaminophen 10-325 MG tablet  Commonly known as:  PERCOCET  Take 1 tablet by mouth every 8 (eight) hours as needed for pain. ONE MONTH SUPPLY     OXYGEN  Inhale 3 L into the lungs continuous.     pantoprazole 40 MG tablet  Commonly known as:  PROTONIX  Take 1 tablet (40 mg total) by mouth daily.     polyethylene glycol packet  Commonly known as:  MIRALAX / GLYCOLAX  Take 17 g by mouth daily as needed (constipation). Mix in 8 oz liquid and drink     potassium chloride SA 20 MEQ tablet  Commonly known as:  K-DUR,KLOR-CON  Take 20-40 mEq by mouth 2 (two) times daily. Take 2 tablets (40 meq) by mouth every morning and 1 tablet (20 meq) at night     traZODone 50 MG tablet  Commonly known as:  DESYREL  2-4 tabs po qhs prn insomnia     Vitamin D3 50000 units Caps  Take 1 capsule by mouth every Monday.        ROS: Negative, with the exception of above mentioned in HPI  Objective:  BP 124/79 mmHg  Pulse 76  Temp(Src) 98.1 F (36.7 C) (Oral)  Resp 20  Wt 174 lb 4 oz (79.039 kg)  SpO2 95% Body mass index is 29.9 kg/(m^2). Gen: Afebrile. No acute distress. Nontoxic in appearance on O2, [place] 3 L HENT: AT. [place]. MMM, no oral lesions.  Eyes:Pupils Equal Round Reactive to light, Extraocular movements intact,  Conjunctiva without redness, discharge or icterus. CV: RRR, Trace edema left lower extremity, no edema right lower  extremity Chest: On 3 LNC, unlabored breathing. Faint crackles bilateral bases. Neuro:  PERLA. EOMi. Alert. Oriented x3   Assessment/Plan: NORELI FREYMAN is a 70 y.o. female present for acute OV for  Hypertension: - Patient blood pressure is well-controlled today. Discussed with patient in great detail I would not change anything in her medication regimen at this time. Attempted to discuss with her that any medication change could drop her blood pressure too far causing her to have symptoms/syncope. Patient was adamant that she needed blood pressure control, because she felt she was going to have a stroke with her blood pressure being high in the morning. - Patient admits that her lower extremity edema is almost completely resolved. However she then goes on to state that she again has gained 6 pounds of weight over the last week. She has been in contact with her cardiology office over the fluctuations in her weight and they have been adjusting her Lasix accordingly. - Discussed with patient despite my opinion of  not needing additional blood pressure medication by what I see in the office today, I will contact her cardiology office and discuss with her cardiologist since she has had elevated blood pressures that are being transmitted to his office through her home monitoring. - I spoke with Dr. Stanford Breed while the patient was in the office, and he agreed that with her blood pressure being 124/79 in the office today there is not any room to add additional medications to her blood pressure regimen. Patient has follow-up with Dr. Stanford Breed in one week. - I encourage patient to continue to report to Dr. Jacalyn Lefevre office with her blood pressures and weight changes as directed. She does not appear fluid overloaded on today's exam. Her blood pressure is controlled today. -  Unfortunately, patient became very argumentative with this provider and stated "just admit  there is nothing you can do for me and I need  to see Dr. Stanford Breed from my problems". I discussed with her since Dr. Stanford Breed is managing these issues and he is receiving her blood pressure readings through home transmission, he would be the best physician to follow-up with concerning these issues. I encouraged her to keep her appointment with him next week, and if any worsening symptoms should arise before then she should call his office or seek care in the emergency room.   electronically signed by:  Howard Pouch, DO  [place]

## 2016-04-29 NOTE — Telephone Encounter (Signed)
Pt is currently in their office, c/o elevated bp. The bp is reported as 120/70. No medication changes made at this time.

## 2016-04-29 NOTE — Telephone Encounter (Signed)
New message      Pt is in the office now.  Talk to the doctor about her bp

## 2016-04-29 NOTE — Patient Instructions (Signed)
Please call Dr. Stanford Breed for weight and BP monitoring.  If headache or dizziness worsens go to ED. I spoke with Dr. Stanford Breed today and he agreed with plan.  Please follow up with him on Monday, sooner if weight gain or BP readings elevated.

## 2016-04-30 DIAGNOSIS — F329 Major depressive disorder, single episode, unspecified: Secondary | ICD-10-CM | POA: Diagnosis not present

## 2016-04-30 DIAGNOSIS — G8929 Other chronic pain: Secondary | ICD-10-CM | POA: Diagnosis not present

## 2016-04-30 DIAGNOSIS — I5033 Acute on chronic diastolic (congestive) heart failure: Secondary | ICD-10-CM | POA: Diagnosis not present

## 2016-04-30 DIAGNOSIS — I2510 Atherosclerotic heart disease of native coronary artery without angina pectoris: Secondary | ICD-10-CM | POA: Diagnosis not present

## 2016-04-30 DIAGNOSIS — I110 Hypertensive heart disease with heart failure: Secondary | ICD-10-CM | POA: Diagnosis not present

## 2016-04-30 DIAGNOSIS — M797 Fibromyalgia: Secondary | ICD-10-CM | POA: Diagnosis not present

## 2016-04-30 NOTE — Progress Notes (Signed)
HPI: FU diastolic congestive heart failure. Cardiac catheterization February 2014 showed nonobstructive disease (most significant 50-60 RCA) and normal LV function; note of 4+ MR. Renal Dopplers May 2014 showed no stenosis. Nuclear study March 2016 showed ejection fraction 96% and no ischemia or infarction. Also with history of presumed orthostatic syncope. Previously followed by Dr Terrence Dupont. Echocardiogram repeated September 2016 and showed normal LV function, grade 1 diastolic dysfunction and trace tricuspid regurgitation. Seen recently for dyspnea and fatigue. Admitted 4/17 with CHF and diuresed. BNP and TSH 4/17 normal. Since last seen, She has multiple complaints. She describes persistent dyspnea. She has chest heaviness both at rest and with exertion. Relieved with nitroglycerin. No change compared to previous. She states her blood pressure in the morning is 190/110 and she has intermittent headaches. She complains of tingling sensation in her chest. Her pedal edema has improved.  Current Outpatient Prescriptions  Medication Sig Dispense Refill  . albuterol (PROVENTIL HFA;VENTOLIN HFA) 108 (90 BASE) MCG/ACT inhaler Inhale 2 puffs into the lungs every 6 (six) hours as needed for wheezing or shortness of breath. 1 Inhaler 1  . ALPRAZolam (XANAX) 1 MG tablet Take 1 tablet (1 mg total) by mouth 3 (three) times daily as needed for anxiety. (Patient taking differently: Take 1 mg by mouth See admin instructions. Take 1 tablet (1 mg) by mouth twice daily, may take a 3rd tablet as needed for anxiety) 90 tablet 5  . ARIPiprazole (ABILIFY) 10 MG tablet Take 1 tablet (10 mg total) by mouth daily. 30 tablet 2  . aspirin 81 MG EC tablet Take 1 tablet (81 mg total) by mouth daily. (Patient taking differently: Take 81 mg by mouth at bedtime. ) 30 tablet   . atorvastatin (LIPITOR) 20 MG tablet Take 20 mg by mouth at bedtime.    . budesonide-formoterol (SYMBICORT) 160-4.5 MCG/ACT inhaler Inhale 2 puffs into  the lungs 2 (two) times daily. 1 Inhaler 12  . BYSTOLIC 5 MG tablet TAKE 1 TABLET ONCE DAILY. 30 tablet 12  . Cholecalciferol (VITAMIN D3) 50000 units CAPS Take 1 capsule by mouth every Monday.     . diclofenac sodium (VOLTAREN) 1 % GEL Apply 1 application topically 3 (three) times daily as needed (pain).     . DULoxetine (CYMBALTA) 30 MG capsule TAKE 1 CAPSULE ONCE DAILY ALONG WITH 60MG  CAPSULE FOR TOTAL 90MG  DAILY. (Patient taking differently: TAKE 1 CAPSULE BY MOUTH DAILY AT BEDTIME - WITH A 60 MG CAPSULE FOR A 90 MG DOSE) 30 capsule 11  . DULoxetine (CYMBALTA) 60 MG capsule Take 60 mg by mouth at bedtime. Take with a 30 mg capsule for a 90 mg dose    . folic acid (FOLVITE) 1 MG tablet Take 2 tablets (2 mg total) by mouth daily. 60 tablet 6  . furosemide (LASIX) 40 MG tablet Take 40 mg by mouth daily.    [name] Kitchen ibuprofen (ADVIL,MOTRIN) 800 MG tablet Take 800 mg by mouth daily as needed (pain).    . isosorbide mononitrate (IMDUR) 30 MG 24 hr tablet TAKE 1 TABLET ONCE DAILY. 30 tablet 6  . LYRICA 300 MG capsule TAKE (1) CAPSULE TWICE DAILY. 60 capsule 0  . Misc Natural Products (OSTEO BI-FLEX ADV JOINT SHIELD) TABS Take 1 tablet by mouth daily.    . Multiple Vitamin (MULTIVITAMIN WITH MINERALS) TABS tablet Take 1 tablet by mouth daily.    . nitroGLYCERIN (NITROSTAT) 0.4 MG SL tablet Place 0.4 mg under the tongue every 5 (five) minutes as  needed for chest pain (x 3 doses).     . ondansetron (ZOFRAN-ODT) 4 MG disintegrating tablet Take 4 mg by mouth every 8 (eight) hours as needed for nausea or vomiting.    [name] Kitchen oxyCODONE-acetaminophen (PERCOCET) 10-325 MG tablet Take 1 tablet by mouth every 8 (eight) hours as needed for pain. ONE MONTH SUPPLY 100 tablet 0  . OXYGEN Inhale 3 L into the lungs continuous.    . pantoprazole (PROTONIX) 40 MG tablet Take 1 tablet (40 mg total) by mouth daily. 90 tablet 1  . polyethylene glycol (MIRALAX / GLYCOLAX) packet Take 17 g by mouth daily as needed (constipation). Mix in  8 oz liquid and drink    . potassium chloride SA (K-DUR,KLOR-CON) 20 MEQ tablet Take 20-40 mEq by mouth 2 (two) times daily. Take 2 tablets (40 meq) by mouth every morning and 1 tablet (20 meq) at night    . traZODone (DESYREL) 50 MG tablet 2-4 tabs po qhs prn insomnia (Patient taking differently: Take 150-200 mg by mouth at bedtime. ) 120 tablet 6   No current facility-administered medications for this visit.   Facility-Administered Medications Ordered in Other Visits  Medication Dose Route Frequency Provider Last Rate Last Dose  . ferumoxytol (FERAHEME) 510 mg in sodium chloride 0.9 % 100 mL IVPB  510 mg Intravenous Once Volanda Napoleon, MD         Past Medical History  Diagnosis Date  . HTN (hypertension)   . Depression   . Recurrent UTI     hx of hospitalization for pyelonephritis; started abx prophylaxis 06/2015  . Hay fever   . Mixed incontinence urge and stress   . Diverticular disease   . Insomnia   . Fibromyalgia     Patient states dx was around her late 2s but she had sx's for years prior to this.  . Syncope     Hypotensive; ED visit--Dr. Terrence Dupont did Cath--nonobstructive CAD, EF 55-60%.  In retrospect, suspect pt rx med misuse/polypharmacy  . Idiopathic angio-edema-urticaria 72014    Angioedema component was very minimal  . Asthma     w/ asbestososis   . History of pneumonia     hospitalized 12/2011, 02/2013, and 07/2013 [facility]) for this  . Anginal pain ([place])     Nonobstructive CAD 2014; however, her cardiologist put her on a statin for this and NOT for hyperlipidemia per pt report.  . OSA on CPAP     prior to move to [place]--had another sleep study 10/2015 w/pulm Dr. Camillo Flaming.  . H/O hiatal hernia   . Migraine syndrome     "not as often anymore; used to be ~ q wk" (07/12/2013)  . Tension headache, chronic   . DDD (degenerative disc disease)     lumbar and cervical.   . Osteoarthritis     "severe; progressing fast" (07/12/2013); multiple joints-not surgical candidate for TKR  (03/2015)  . Chronic lower back pain   . Anxiety     panic attacks  . Nephrolithiasis     "passed all on my own or they are still in there" (07/12/2013)  . Pyelonephritis     "several times over the last 30 yr" (07/12/2013)  . Diastolic congestive heart failure ([place])   . COPD (chronic obstructive pulmonary disease) ([place])   . Pulmonary embolism ([place]) 07/2013    Dx at [facility] with very small peripheral upper lobe pe 07/2013: pt took coumadin for about 8-9 mo  . Pleural plaque with presence of asbestos 07/22/2013  .  BPPV (benign paroxysmal positional vertigo) 12/16/2012  . RBBB (right bundle branch block)   . Acute upper GI bleed 06/2014    while pt taking coumadin, plavix, and meloxicam---despite being told not to take coumadin.  . Iron deficiency anemia     Hematologist in [place], [place] did extensive w/u; no cause found; failed oral supplement;; gets fairly regular (q89m or so) IV iron infusions (Venofer -iron sucrose- 200mg  with procrit.  "for 14 yr I've been getting blood work q month & getting infusions prn" (07/12/2013).  Dr. Marin Olp locally, iron infusions done, EPO deficiency dx'd  . Pernicious anemia 08/24/2014    Past Surgical History  Procedure Laterality Date  . Appendectomy  1960  . Total abdominal hysterectomy  1974  . Tendon release  1996    Right forearm and hand  . Knee surgery  2005  . Heel spur surgery Left 2008  . Plantar fascia release Left 2008  . Axillary surgery Left 1978    Multiple "lump" in armpit per pt  . Coccyx removal  1972  . Cardiac catheterization  01/2013    nonobstructive CAD, EF 55-60%  . Transthoracic echocardiogram  01/2013; 04/2014;08/2015    2014--NORMAL.  2015--focal basal septal hypertrophy, EF 55-60%, grade I diast dysfxn, mild LAE.  08/2015 EF 55-60%, nl LV syst fxn, grade I DD, valves wnl  . Dilation and curettage of uterus  ? 1970's  . Eye surgery Left 2012-2013    "injections for ~ 1 yr; don't really know what for" (07/12/2013)  . Spirometry  04/25/14      In hosp for acute asthma/COPD flare: mixed obstructive and restrictive lung disease. The FEV1 is severely reduced at 45% predicted.  FEV1 signif decreased compared to prior spirometry 07/23/13.  [name] Kitchen Esophagogastroduodenoscopy N/A 07/19/2014    Gastritis found + in the setting of supratherapeutic INR, +plavix, + meloxicam.  . Left heart catheterization with coronary angiogram N/A 01/30/2013    Procedure: LEFT HEART CATHETERIZATION WITH CORONARY ANGIOGRAM;  Surgeon: Clent Demark, MD;  Location: [facility] CATH LAB;  Service: Cardiovascular;  Laterality: N/A;  . Cardiovascular stress test  02/22/15    Low risk myocard perf imaging; wall motion normal, normal EF    Social History   Social History  . Marital Status: Widowed    Spouse Name: N/A  . Number of Children: 2  . Years of Education: N/A   Occupational History  . Not on file.   Social History Main Topics  . Smoking status: Never Smoker   . Smokeless tobacco: Never Used     Comment: never used tobacco  . Alcohol Use: No  . Drug Use: No  . Sexual Activity: Not Currently   Other Topics Concern  . Not on file   Social History Narrative   Widowed, 2 sons.  Relocated to [place] 09/2012 to be closer to her son who has MS.   Husband d 2015--mesothelioma.   Occupation: former Pharmacist, hospital.   Education: masters degree level.   No T/A/Ds.    Family History  Problem Relation Age of Onset  . Arthritis Mother   . Kidney disease Mother   . Heart disease Father   . Stroke Father   . Hypertension Father   . Diabetes Father     ROS: no fevers or chills, productive cough, hemoptysis, dysphasia, odynophagia, melena, hematochezia, dysuria, hematuria, rash, seizure activity, orthopnea, PND, pedal edema, claudication. Remaining systems are negative.  Physical Exam: Well-developed chronically ill appearing in no acute distress.  Skin  is warm and dry.  HEENT is normal.  Neck is supple.  Chest is clear to auscultation with normal expansion.   Cardiovascular exam is regular rate and rhythm.  Abdominal exam nontender or distended. No masses palpated. Extremities show no edema. neuro grossly intact; flat affect   Electrocardiogram shows sinus rhythm with right bundle branch block. No ST changes.

## 2016-05-01 ENCOUNTER — Telehealth: Payer: Self-pay | Admitting: Cardiology

## 2016-05-01 ENCOUNTER — Encounter: Payer: Medicare Other | Admitting: Registered Nurse

## 2016-05-01 NOTE — Telephone Encounter (Signed)
Returned call to pt. She states her BP now is 143/84, did not know HR. The lightheadedness has improved but she still has a headache. She has been on a senior trip to the [place] all day and just returned. She says she has swelling in her feet. She denies CP but reports feeling a "heaviness" on her chest.  Denies SOB, palpitations, N/V or sweating. Advised pt to call 911 and go to the ER to be evaluated but she refused at this time and wanted to rest and see if that helped. Strongly encouraged pt to call 911 in the near future if the heaviness did not go away or she developed new or worsening symptoms. Pt verbalized understanding. Pt to call in the morning if she is still having issues.  Will route to Dr Stanford Breed and Fredia Beets.

## 2016-05-01 NOTE — Telephone Encounter (Signed)
Received call from [place] who said pt's BP has been elevated in the mornings consistantly. The nurse wanted to make sure Dr Stanford Breed was aware. Readings are 198/119 prior to pt taking her BP medication, then decreases to around 130-140/88.  Pt usually does not have any symptoms except on one occasion where she went to urgent care on Sunday, 04/28/16 for dizziness and headache associated with elevated BP. Pt seen by PCP on 04/29/16 and who consulted with Dr Stanford Breed and to continue current plan at this time and for the pt to see him as scheduled on 05/06/16 appt.  Called pt to check on her. She is on a trip with [employer] in the [place]. I asked her how she was feeling and she said she had a headache that would not go away and felt dizzy. She said she has gained 6 pounds in a week and her abdomen felt swollen.  She reports slight swelling in her ankles and feet. Her BP before she left for the trip at [place] with morning was 198/119. Pt did take her daily medications before she left for the trip.  Pt did admit and say, "May be I should not have come on this trip." I asked the pt if she had a way to take her BP and she said no. She had not told anyone that she was feeling bad. Encouraged the pt to tell the leader she was not feeling well and the leader said she could call 911 and go to the hospital. Pt seemed reluctant to be checked out but encouraged pt to go to the ER or call 911 to be evaluated since they are in the [place].  Called [place] at [place] to give her this update.  Will route to Dr Stanford Breed and Fredia Beets.

## 2016-05-01 NOTE — Telephone Encounter (Signed)
New message      The pt is calling to speak with a nurse about her blood pressure, it just about her blood pressure today.

## 2016-05-02 DIAGNOSIS — G8929 Other chronic pain: Secondary | ICD-10-CM | POA: Diagnosis not present

## 2016-05-02 DIAGNOSIS — I5033 Acute on chronic diastolic (congestive) heart failure: Secondary | ICD-10-CM | POA: Diagnosis not present

## 2016-05-02 DIAGNOSIS — F329 Major depressive disorder, single episode, unspecified: Secondary | ICD-10-CM | POA: Diagnosis not present

## 2016-05-02 DIAGNOSIS — I2510 Atherosclerotic heart disease of native coronary artery without angina pectoris: Secondary | ICD-10-CM | POA: Diagnosis not present

## 2016-05-02 DIAGNOSIS — M797 Fibromyalgia: Secondary | ICD-10-CM | POA: Diagnosis not present

## 2016-05-02 DIAGNOSIS — I110 Hypertensive heart disease with heart failure: Secondary | ICD-10-CM | POA: Diagnosis not present

## 2016-05-03 ENCOUNTER — Other Ambulatory Visit: Payer: Self-pay | Admitting: *Deleted

## 2016-05-03 ENCOUNTER — Encounter: Payer: Medicare Other | Attending: Physical Medicine & Rehabilitation | Admitting: Registered Nurse

## 2016-05-03 ENCOUNTER — Encounter: Payer: Self-pay | Admitting: Registered Nurse

## 2016-05-03 VITALS — BP 145/88 | HR 77 | Resp 14

## 2016-05-03 DIAGNOSIS — G894 Chronic pain syndrome: Secondary | ICD-10-CM | POA: Diagnosis not present

## 2016-05-03 DIAGNOSIS — Z5181 Encounter for therapeutic drug level monitoring: Secondary | ICD-10-CM | POA: Diagnosis not present

## 2016-05-03 DIAGNOSIS — Z79899 Other long term (current) drug therapy: Secondary | ICD-10-CM | POA: Diagnosis not present

## 2016-05-03 DIAGNOSIS — R2689 Other abnormalities of gait and mobility: Secondary | ICD-10-CM | POA: Diagnosis not present

## 2016-05-03 DIAGNOSIS — M1711 Unilateral primary osteoarthritis, right knee: Secondary | ICD-10-CM | POA: Diagnosis not present

## 2016-05-03 DIAGNOSIS — M1712 Unilateral primary osteoarthritis, left knee: Secondary | ICD-10-CM | POA: Diagnosis not present

## 2016-05-03 DIAGNOSIS — M797 Fibromyalgia: Secondary | ICD-10-CM | POA: Insufficient documentation

## 2016-05-03 DIAGNOSIS — I2510 Atherosclerotic heart disease of native coronary artery without angina pectoris: Secondary | ICD-10-CM | POA: Diagnosis not present

## 2016-05-03 DIAGNOSIS — M545 Low back pain, unspecified: Secondary | ICD-10-CM

## 2016-05-03 MED ORDER — OXYCODONE-ACETAMINOPHEN 10-325 MG PO TABS: 1.0000 | ORAL_TABLET | Freq: Three times a day (TID) | ORAL | Status: DC | PRN

## 2016-05-03 NOTE — Patient Outreach (Signed)
[facility]) Care Management  05/03/2016  Linda [name] March 31, 1946 [id number]   Transition of care   RN spoke with pt today and inquired on her ongoing health. Pt repots her weight today 170.4 lbs, yesterday 174 lbs and last week 174 lbs. States she had some swelling and symptoms earlier this week. Pt states she spoke with her provider and has permission to take extra fluid medication. Pt states since taking this medication her symptoms are improving with no additional issues. GREEN zones as she continues to recover. Pt overall doing very well with no additional issues and was able to react in the early stages of her encounters with symptoms from her HF. RN explained this is the process taught in managing her HF independently by recognizing symptoms and reaction early to them with intervention provided by her provider. RN offered to continue transition of care call and follow up next week ( pt agreed).   [name], RN Care Management Coordinator [place] Office [phone]

## 2016-05-03 NOTE — Progress Notes (Signed)
Subjective:    Patient ID: Linda [name], female    DOB: [birth year], 70 y.o.   MRN: [id number]  HPI: [name] is a 70 year old female who returns for follow up for chronic pain and medication refill. She states her pain is located in her  lower back radiating into her right hip and right lower extremity laterally.  She rates her pain 7. Her current exercise regime is attending physical therapy three days a week at [facility] and walking.  Pain Inventory Average Pain 5 Pain Right Now 7 My pain is stabbing, aching and throbbing   In the last 24 hours, has pain interfered with the following? General activity 7 Relation with others 7 Enjoyment of life 8 What TIME of day is your pain at its worst? morning, daytime, evening Sleep (in general) Fair  Pain is worse with: walking, bending, standing and some activites Pain improves with: rest and heat/ice Relief from Meds: 5  Mobility walk with assistance use a walker how many minutes can you walk? 10 ability to climb steps?  no do you drive?  yes Do you have any goals in this area?  yes  Function retired I need assistance with the following:  meal prep, household duties and shopping  Neuro/Psych bladder control problems weakness trouble walking anxiety  Prior Studies Any changes since last visit?  yes x-rays CT/MRI  Physicians involved in your care Primary care . Rheumatologist . Orthopedist .   Family History  Problem Relation Age of Onset  . Arthritis Mother   . Kidney disease Mother   . Heart disease Father   . Stroke Father   . Hypertension Father   . Diabetes Father    Social History   Social History  . Marital Status: Widowed    Spouse Name: N/A  . Number of Children: 2  . Years of Education: N/A   Social History Main Topics  . Smoking status: Never Smoker   . Smokeless tobacco: Never Used     Comment: never used tobacco  . Alcohol Use: No  . Drug Use: No  . Sexual  Activity: Not Currently   Other Topics Concern  . None   Social History Narrative   Widowed, 2 sons.  Relocated to [place] 09/2012 to be closer to her son who has MS.   Husband d 2015--mesothelioma.   Occupation: former Pharmacist, hospital.   Education: masters degree level.   No T/A/Ds.   Past Surgical History  Procedure Laterality Date  . Appendectomy  1960  . Total abdominal hysterectomy  1974  . Tendon release  1996    Right forearm and hand  . Knee surgery  2005  . Heel spur surgery Left 2008  . Plantar fascia release Left 2008  . Axillary surgery Left 1978    Multiple "lump" in armpit per pt  . Coccyx removal  1972  . Cardiac catheterization  01/2013    nonobstructive CAD, EF 55-60%  . Transthoracic echocardiogram  01/2013; 04/2014;08/2015    2014--NORMAL.  2015--focal basal septal hypertrophy, EF 55-60%, grade I diast dysfxn, mild LAE.  08/2015 EF 55-60%, nl LV syst fxn, grade I DD, valves wnl  . Dilation and curettage of uterus  ? 1970's  . Eye surgery Left 2012-2013    "injections for ~ 1 yr; don't really know what for" (07/12/2013)  . Spirometry  04/25/14    In hosp for acute asthma/COPD flare: mixed obstructive and restrictive lung disease. The FEV1  is severely reduced at 45% predicted.  FEV1 signif decreased compared to prior spirometry 07/23/13.  [name] Kitchen Esophagogastroduodenoscopy N/A 07/19/2014    Gastritis found + in the setting of supratherapeutic INR, +plavix, + meloxicam.  . Left heart catheterization with coronary angiogram N/A 01/30/2013    Procedure: LEFT HEART CATHETERIZATION WITH CORONARY ANGIOGRAM;  Surgeon: Clent Demark, MD;  Location: [facility] CATH LAB;  Service: Cardiovascular;  Laterality: N/A;  . Cardiovascular stress test  02/22/15    Low risk myocard perf imaging; wall motion normal, normal EF   Past Medical History  Diagnosis Date  . HTN (hypertension)   . Depression   . Recurrent UTI     hx of hospitalization for pyelonephritis; started abx prophylaxis 06/2015  . Hay fever   .  Mixed incontinence urge and stress   . Diverticular disease   . Insomnia   . Fibromyalgia     Patient states dx was around her late 13s but she had sx's for years prior to this.  . Syncope     Hypotensive; ED visit--Dr. Terrence Dupont did Cath--nonobstructive CAD, EF 55-60%.  In retrospect, suspect pt rx med misuse/polypharmacy  . Idiopathic angio-edema-urticaria 72014    Angioedema component was very minimal  . Asthma     w/ asbestososis   . History of pneumonia     hospitalized 12/2011, 02/2013, and 07/2013 [facility]) for this  . Anginal pain ([place])     Nonobstructive CAD 2014; however, her cardiologist put her on a statin for this and NOT for hyperlipidemia per pt report.  . OSA on CPAP     prior to move to [place]--had another sleep study 10/2015 w/pulm Dr. Camillo Flaming.  . H/O hiatal hernia   . Migraine syndrome     "not as often anymore; used to be ~ q wk" (07/12/2013)  . Tension headache, chronic   . DDD (degenerative disc disease)     lumbar and cervical.   . Osteoarthritis     "severe; progressing fast" (07/12/2013); multiple joints-not surgical candidate for TKR (03/2015)  . Chronic lower back pain   . Anxiety     panic attacks  . Nephrolithiasis     "passed all on my own or they are still in there" (07/12/2013)  . Pyelonephritis     "several times over the last 30 yr" (07/12/2013)  . Diastolic congestive heart failure ([place])   . COPD (chronic obstructive pulmonary disease) ([place])   . Pulmonary embolism ([place]) 07/2013    Dx at [facility] with very small peripheral upper lobe pe 07/2013: pt took coumadin for about 8-9 mo  . Pleural plaque with presence of asbestos 07/22/2013  . BPPV (benign paroxysmal positional vertigo) 12/16/2012  . RBBB (right bundle branch block)   . Acute upper GI bleed 06/2014    while pt taking coumadin, plavix, and meloxicam---despite being told not to take coumadin.  . Iron deficiency anemia     Hematologist in [place], [place] did extensive w/u; no cause found; failed oral supplement;;  gets fairly regular (q32m or so) IV iron infusions (Venofer -iron sucrose- 200mg  with procrit.  "for 14 yr I've been getting blood work q month & getting infusions prn" (07/12/2013).  Dr. Marin Olp locally, iron infusions done, EPO deficiency dx'd  . Pernicious anemia 08/24/2014   BP 145/88 mmHg  Pulse 77  Resp 14  SpO2 98%  Opioid Risk Score:   Fall Risk Score:  `1  Depression screen PHQ 2/9  Depression screen [facility] 2/9 04/19/2016 08/11/2015 07/12/2015 03/27/2015  03/31/2014  Decreased Interest 0 3 0 3 0  Down, Depressed, Hopeless 0 3 1 3  0  PHQ - 2 Score 0 6 1 6  0  Altered sleeping - 2 - 2 -  Tired, decreased energy - 3 - 3 -  Change in appetite - 0 - 2 -  Feeling bad or failure about yourself  - 2 - 2 -  Trouble concentrating - 0 - 2 -  Moving slowly or fidgety/restless - 0 - 0 -  Suicidal thoughts - 0 - 0 -  PHQ-9 Score - 13 - 17 -     Review of Systems  Constitutional: Positive for unexpected weight change.       Bladder control problems   Respiratory: Positive for cough and shortness of breath.   Cardiovascular:       Limb swelling   Gastrointestinal: Positive for nausea.  Musculoskeletal: Positive for gait problem.  Neurological: Positive for weakness.  Psychiatric/Behavioral: The patient is nervous/anxious.   All other systems reviewed and are negative.      Objective:   Physical Exam  Constitutional: She is oriented to person, place, and time. She appears well-developed and well-nourished.  HENT:  Head: Normocephalic and atraumatic.  Neck: Normal range of motion. Neck supple.  Cardiovascular: Normal rate and regular rhythm.   Pulmonary/Chest: Effort normal and breath sounds normal.  Continuous Oxygen 3 liters nasal cannula  Musculoskeletal:  Normal Muscle Bulk and Muscle Testing Reveals: Upper Extremities: Full ROM and Muscle Strength 5/5 Thoracic Paraspinal Tenderness: T-1- T-3 Lumbar Paraspinal Tenderness: L-3- L-5 Lower Extremities: Full ROM and Muscle Strength  5/5 Arises from chair slowly using walker for support Narrow Based Gait  Neurological: She is alert and oriented to person, place, and time.  Skin: Skin is warm and dry.  Psychiatric: She has a normal mood and affect.  Nursing note and vitals reviewed.         Assessment & Plan:  1. Functional deficits secondary to Gait disorder: Continue with Physical Therapy. 2. Chronic Back pain/fibromyalgia/R>L Knee OA Pain Management:  Refilled: Oxycodone 10/325 one tablet every 6 hours #100/ Continue Lyrica and Voltaren Gel  3. Depression with anxiety/Grief reaction/Mood: Continue Trazodone and Cymbalta .  4. [name] with asthma: Oxygen dependent. Albuterol prn.  On Continuous Oxygen Therapy 3 liters/ Pulmonology Following. 5. Bilateral Osteoarthritis of Bilateral Knees: Dr. Juanda Bond Following. 6. Fibromyalgia: Continue with exercise regime.  20 minutes of face to face patient care time was spent during this visit. All questions were encouraged and answered.

## 2016-05-03 NOTE — Addendum Note (Signed)
Addended by: Gara Kroner L on: 05/03/2016 03:37 PM   Modules accepted: Orders

## 2016-05-06 ENCOUNTER — Ambulatory Visit (INDEPENDENT_AMBULATORY_CARE_PROVIDER_SITE_OTHER): Payer: Medicare Other | Admitting: Cardiology

## 2016-05-06 ENCOUNTER — Encounter: Payer: Self-pay | Admitting: Cardiology

## 2016-05-06 VITALS — BP 110/70 | HR 80 | Ht 63.0 in | Wt 173.0 lb

## 2016-05-06 DIAGNOSIS — E785 Hyperlipidemia, unspecified: Secondary | ICD-10-CM

## 2016-05-06 DIAGNOSIS — R072 Precordial pain: Secondary | ICD-10-CM

## 2016-05-06 DIAGNOSIS — I5032 Chronic diastolic (congestive) heart failure: Secondary | ICD-10-CM

## 2016-05-06 DIAGNOSIS — I2510 Atherosclerotic heart disease of native coronary artery without angina pectoris: Secondary | ICD-10-CM | POA: Diagnosis not present

## 2016-05-06 DIAGNOSIS — I10 Essential (primary) hypertension: Secondary | ICD-10-CM

## 2016-05-06 NOTE — Assessment & Plan Note (Signed)
Continue statin. 

## 2016-05-06 NOTE — Assessment & Plan Note (Signed)
Symptoms appear to be chronic. Her electrocardiogram shows no ST changes. Will not pursue further ischemia evaluation.

## 2016-05-06 NOTE — Assessment & Plan Note (Signed)
Patient is not volume overloaded on examination. Continue present dose of Lasix.

## 2016-05-06 NOTE — Patient Instructions (Signed)
Medication Instructions:   NO CHANGE  Follow-Up: Your physician wants you to follow-up in: 6 MONTHS WITH DR CRENSHAW. You will receive a reminder letter in the mail two months in advance. If you don't receive a letter, please call our office to schedule the follow-up appointment.  If you need a refill on your cardiac medications before your next appointment, please call your pharmacy.   

## 2016-05-06 NOTE — Assessment & Plan Note (Signed)
Blood pressure is controlled in the office today. She does state it is now 190/110 range at home occasionally particularly in the mornings. I am hesitant to advance medications at this point given systolic [id number] in the office. I do not want to make her hypotensive. I have asked her to bring her cuff to the office and we will correlate with ours to make sure accurate. Follow blood pressure and adjust regimen as needed.

## 2016-05-06 NOTE — Assessment & Plan Note (Signed)
Continue aspirin and statin. 

## 2016-05-07 DIAGNOSIS — I5033 Acute on chronic diastolic (congestive) heart failure: Secondary | ICD-10-CM | POA: Diagnosis not present

## 2016-05-07 DIAGNOSIS — I2510 Atherosclerotic heart disease of native coronary artery without angina pectoris: Secondary | ICD-10-CM | POA: Diagnosis not present

## 2016-05-07 DIAGNOSIS — G8929 Other chronic pain: Secondary | ICD-10-CM | POA: Diagnosis not present

## 2016-05-07 DIAGNOSIS — M797 Fibromyalgia: Secondary | ICD-10-CM | POA: Diagnosis not present

## 2016-05-07 DIAGNOSIS — F329 Major depressive disorder, single episode, unspecified: Secondary | ICD-10-CM | POA: Diagnosis not present

## 2016-05-07 DIAGNOSIS — I110 Hypertensive heart disease with heart failure: Secondary | ICD-10-CM | POA: Diagnosis not present

## 2016-05-08 DIAGNOSIS — I5033 Acute on chronic diastolic (congestive) heart failure: Secondary | ICD-10-CM | POA: Diagnosis not present

## 2016-05-08 DIAGNOSIS — F329 Major depressive disorder, single episode, unspecified: Secondary | ICD-10-CM | POA: Diagnosis not present

## 2016-05-08 DIAGNOSIS — I2510 Atherosclerotic heart disease of native coronary artery without angina pectoris: Secondary | ICD-10-CM | POA: Diagnosis not present

## 2016-05-08 DIAGNOSIS — M797 Fibromyalgia: Secondary | ICD-10-CM | POA: Diagnosis not present

## 2016-05-08 DIAGNOSIS — I110 Hypertensive heart disease with heart failure: Secondary | ICD-10-CM | POA: Diagnosis not present

## 2016-05-08 DIAGNOSIS — G8929 Other chronic pain: Secondary | ICD-10-CM | POA: Diagnosis not present

## 2016-05-09 DIAGNOSIS — I2510 Atherosclerotic heart disease of native coronary artery without angina pectoris: Secondary | ICD-10-CM | POA: Diagnosis not present

## 2016-05-09 DIAGNOSIS — G8929 Other chronic pain: Secondary | ICD-10-CM | POA: Diagnosis not present

## 2016-05-09 DIAGNOSIS — M797 Fibromyalgia: Secondary | ICD-10-CM | POA: Diagnosis not present

## 2016-05-09 DIAGNOSIS — I5033 Acute on chronic diastolic (congestive) heart failure: Secondary | ICD-10-CM | POA: Diagnosis not present

## 2016-05-09 DIAGNOSIS — I110 Hypertensive heart disease with heart failure: Secondary | ICD-10-CM | POA: Diagnosis not present

## 2016-05-09 DIAGNOSIS — F329 Major depressive disorder, single episode, unspecified: Secondary | ICD-10-CM | POA: Diagnosis not present

## 2016-05-10 ENCOUNTER — Other Ambulatory Visit: Payer: Self-pay | Admitting: *Deleted

## 2016-05-10 ENCOUNTER — Encounter: Payer: Self-pay | Admitting: *Deleted

## 2016-05-10 ENCOUNTER — Ambulatory Visit: Payer: Self-pay | Admitting: *Deleted

## 2016-05-10 DIAGNOSIS — I2510 Atherosclerotic heart disease of native coronary artery without angina pectoris: Secondary | ICD-10-CM | POA: Diagnosis not present

## 2016-05-10 DIAGNOSIS — F329 Major depressive disorder, single episode, unspecified: Secondary | ICD-10-CM | POA: Diagnosis not present

## 2016-05-10 DIAGNOSIS — G8929 Other chronic pain: Secondary | ICD-10-CM | POA: Diagnosis not present

## 2016-05-10 DIAGNOSIS — I5033 Acute on chronic diastolic (congestive) heart failure: Secondary | ICD-10-CM | POA: Diagnosis not present

## 2016-05-10 DIAGNOSIS — I110 Hypertensive heart disease with heart failure: Secondary | ICD-10-CM | POA: Diagnosis not present

## 2016-05-10 DIAGNOSIS — M797 Fibromyalgia: Secondary | ICD-10-CM | POA: Diagnosis not present

## 2016-05-10 NOTE — Patient Outreach (Signed)
[facility]) Care Management  05/10/2016  Linda [name] 11-Mar-1946 [id number]  Transition of Care  RN spoke pt today and inquired on her ongoing management of care. Pt her weights today was 169 lbs, yesterday 168 lbs and last week 169 lbs. RN reviewed the HF zones and verified pt remains in the GREEN  Zone today other the some SOB with her exercise therapy with PT today.  Pt reports she continues to have PT who is visiting today and pt reports a short walk and currently learning how to control her breathing with the therapist. No other inquired or request at this time. Plans to schedule ongoing follow up for a home visit/transition of care contact next week.   [name], RN Care Management Coordinator [place] Office [phone]

## 2016-05-12 LAB — TOXASSURE SELECT,+ANTIDEPR,UR

## 2016-05-13 DIAGNOSIS — M797 Fibromyalgia: Secondary | ICD-10-CM | POA: Diagnosis not present

## 2016-05-13 DIAGNOSIS — I2510 Atherosclerotic heart disease of native coronary artery without angina pectoris: Secondary | ICD-10-CM | POA: Diagnosis not present

## 2016-05-13 DIAGNOSIS — I110 Hypertensive heart disease with heart failure: Secondary | ICD-10-CM | POA: Diagnosis not present

## 2016-05-13 DIAGNOSIS — G8929 Other chronic pain: Secondary | ICD-10-CM | POA: Diagnosis not present

## 2016-05-13 DIAGNOSIS — I5033 Acute on chronic diastolic (congestive) heart failure: Secondary | ICD-10-CM | POA: Diagnosis not present

## 2016-05-13 DIAGNOSIS — F329 Major depressive disorder, single episode, unspecified: Secondary | ICD-10-CM | POA: Diagnosis not present

## 2016-05-14 ENCOUNTER — Other Ambulatory Visit: Payer: Self-pay | Admitting: *Deleted

## 2016-05-14 DIAGNOSIS — I5033 Acute on chronic diastolic (congestive) heart failure: Secondary | ICD-10-CM | POA: Diagnosis not present

## 2016-05-14 DIAGNOSIS — I2510 Atherosclerotic heart disease of native coronary artery without angina pectoris: Secondary | ICD-10-CM | POA: Diagnosis not present

## 2016-05-14 DIAGNOSIS — M797 Fibromyalgia: Secondary | ICD-10-CM | POA: Diagnosis not present

## 2016-05-14 DIAGNOSIS — G8929 Other chronic pain: Secondary | ICD-10-CM | POA: Diagnosis not present

## 2016-05-14 DIAGNOSIS — I110 Hypertensive heart disease with heart failure: Secondary | ICD-10-CM | POA: Diagnosis not present

## 2016-05-14 DIAGNOSIS — F329 Major depressive disorder, single episode, unspecified: Secondary | ICD-10-CM | POA: Diagnosis not present

## 2016-05-14 NOTE — Patient Outreach (Signed)
[place] [facility]) Care Management   05/14/2016  Linda [name] 1946/05/20 [id number]  Linda [name] is an 70 y.o. female  Subjective:  HF: Today pt reports weight at 172.6 lbs and yesterday at 172.4 and last week was 171 lbs. Pt without symptoms over the last week with no acute issues to report. States other the the slight swelling to her lower legs she is in the GREEN zone. Pt reports she is aware when to call and has called in the past when her swelling continues to increase with other symptoms that usual accompany with increased BP and fluid gain. Pt continue to document her daily weights however a few days missed with her weights but will try to do better.  MEDICAL APPOINTMENTS: Pt verifies she has been attending all her medical appointment with no delays or missed appointments. Again pt reports she has transportation to all her appointments.  HOME O2: Pt uses her home O2 day and night on 3 liters with no problems. Denies any issues with dry nostrils with use of her home concentrator. Denies any SOB or issues with her breathing. Pt states she has a pulse ox with an average 94-95% on her saturation readings with no distressful breathing. SWELLING: Pt reports some swelling to her lower legs. Pt indicates she will elevate her legs in her recliner to prevent ongoing swelling to her lower legs. Pt reports [name] continues to visit with PT and the nurse. Pt not sure how many visits will continue. Pt remains receptive to scheduling next home visit with this [place].   Objective:   Review of Systems  Constitutional: Negative.   HENT: Negative.   Eyes: Negative.   Respiratory: Negative.   Cardiovascular: Positive for leg swelling.       Bilaterally to lower legs and ankles  Gastrointestinal: Negative.   Genitourinary: Negative.   Musculoskeletal: Negative.   Skin: Negative.   Neurological: Negative.   Endo/Heme/Allergies: Negative.   Psychiatric/Behavioral: Negative.      Physical Exam  Constitutional: She is oriented to person, place, and time. She appears well-developed and well-nourished.  HENT:  Right Ear: External ear normal.  Left Ear: External ear normal.  Eyes: EOM are normal.  Neck: Normal range of motion.  Cardiovascular: Normal heart sounds.   Respiratory: Effort normal and breath sounds normal.  GI: Soft. Bowel sounds are normal.  Musculoskeletal: Normal range of motion.  Neurological: She is alert and oriented to person, place, and time.  Skin: Skin is warm and dry.  Psychiatric: She has a normal mood and affect. Her behavior is normal. Judgment and thought content normal.    Encounter Medications:   Outpatient Encounter Prescriptions as of 05/14/2016  Medication Sig Note  . albuterol (PROVENTIL HFA;VENTOLIN HFA) 108 (90 BASE) MCG/ACT inhaler Inhale 2 puffs into the lungs every 6 (six) hours as needed for wheezing or shortness of breath.   . ALPRAZolam (XANAX) 1 MG tablet Take 1 tablet (1 mg total) by mouth 3 (three) times daily as needed for anxiety. (Patient taking differently: Take 1 mg by mouth See admin instructions. Take 1 tablet (1 mg) by mouth twice daily, may take a 3rd tablet as needed for anxiety)   . ARIPiprazole (ABILIFY) 10 MG tablet Take 1 tablet (10 mg total) by mouth daily.   [name] Kitchen aspirin 81 MG EC tablet Take 1 tablet (81 mg total) by mouth daily. (Patient taking differently: Take 81 mg by mouth at bedtime. )   . atorvastatin (LIPITOR) 20  MG tablet Take 20 mg by mouth at bedtime.   . budesonide-formoterol (SYMBICORT) 160-4.5 MCG/ACT inhaler Inhale 2 puffs into the lungs 2 (two) times daily.   [name] Kitchen BYSTOLIC 5 MG tablet TAKE 1 TABLET ONCE DAILY.   [name] Kitchen Cholecalciferol (VITAMIN D3) 50000 units CAPS Take 1 capsule by mouth every Monday.    . diclofenac sodium (VOLTAREN) 1 % GEL Apply 1 application topically 3 (three) times daily as needed (pain).    . DULoxetine (CYMBALTA) 30 MG capsule TAKE 1 CAPSULE ONCE DAILY ALONG WITH 60MG   CAPSULE FOR TOTAL 90MG  DAILY. (Patient taking differently: TAKE 1 CAPSULE BY MOUTH DAILY AT BEDTIME - WITH A 60 MG CAPSULE FOR A 90 MG DOSE)   . DULoxetine (CYMBALTA) 60 MG capsule Take 60 mg by mouth at bedtime. Take with a 30 mg capsule for a 90 mg dose   . folic acid (FOLVITE) 1 MG tablet Take 2 tablets (2 mg total) by mouth daily.   . furosemide (LASIX) 40 MG tablet Take 40 mg by mouth daily.   [name] Kitchen ibuprofen (ADVIL,MOTRIN) 800 MG tablet Take 800 mg by mouth daily as needed (pain).   . isosorbide mononitrate (IMDUR) 30 MG 24 hr tablet TAKE 1 TABLET ONCE DAILY.   [name] Kitchen LYRICA 300 MG capsule TAKE (1) CAPSULE TWICE DAILY.   [name] Kitchen Misc Natural Products (OSTEO BI-FLEX ADV JOINT SHIELD) TABS Take 1 tablet by mouth daily. 04/08/2016: Pt ran out 3 weeks ago but plans to purchase more and resume taking  . Multiple Vitamin (MULTIVITAMIN WITH MINERALS) TABS tablet Take 1 tablet by mouth daily.   . nitroGLYCERIN (NITROSTAT) 0.4 MG SL tablet Place 0.4 mg under the tongue every 5 (five) minutes as needed for chest pain (x 3 doses).    . ondansetron (ZOFRAN-ODT) 4 MG disintegrating tablet Take 4 mg by mouth every 8 (eight) hours as needed for nausea or vomiting.   [name] Kitchen oxyCODONE-acetaminophen (PERCOCET) 10-325 MG tablet Take 1 tablet by mouth every 8 (eight) hours as needed for pain. ONE MONTH SUPPLY   . OXYGEN Inhale 3 L into the lungs continuous.   . pantoprazole (PROTONIX) 40 MG tablet Take 1 tablet (40 mg total) by mouth daily.   . polyethylene glycol (MIRALAX / GLYCOLAX) packet Take 17 g by mouth daily as needed (constipation). Mix in 8 oz liquid and drink   . potassium chloride SA (K-DUR,KLOR-CON) 20 MEQ tablet Take 20-40 mEq by mouth 2 (two) times daily. Take 2 tablets (40 meq) by mouth every morning and 1 tablet (20 meq) at night   . traZODone (DESYREL) 50 MG tablet 2-4 tabs po qhs prn insomnia (Patient taking differently: Take 150-200 mg by mouth at bedtime. )    Facility-Administered Encounter Medications as of  05/14/2016  Medication  . ferumoxytol (FERAHEME) 510 mg in sodium chloride 0.9 % 100 mL IVPB    Functional Status:   In your present state of health, do you have any difficulty performing the following activities: 04/19/2016 04/09/2016  Hearing? N N  Vision? N N  Difficulty concentrating or making decisions? N N  Walking or climbing stairs? Y Y  Dressing or bathing? N N  Doing errands, shopping? [place]  Preparing Food and eating ? N -  Using the Toilet? N -  In the past six months, have you accidently leaked urine? Y -  Do you have problems with loss of bowel control? N -  Managing your Medications? N -  Managing your Finances? N -  Housekeeping or  managing your Housekeeping? Y -   BP 110/60 mmHg  Pulse 72  Resp 18  Ht 1.626 m (5\' 4" )  Wt 172 lb 9.6 oz (78.291 kg)  BMI 29.61 kg/m2  SpO2 98% Fall/Depression Screening:    PHQ 2/9 Scores 04/19/2016 08/11/2015 07/12/2015 03/27/2015 03/31/2014  PHQ - 2 Score 0 6 1 6  0  PHQ- 9 Score - 13 - 17 -    Assessment:   Follow up ongoing case management related to HF Follow up ongoing adherence to appointments Home O2 Bilateral edema to lower extremities  Plan:  Will completed a physical assessment and continued teach-back method on increasing pt's knowledge based related her HF. Will reiterate on the HF zones and check pt's knowledge on what to do if acute symptoms should occur. Will also verified pt remains in the GREEN zone however will intervene on her swelling to lower legs.  Verified pt is also aware of the YELLOW AND RED on the HF zones and what to do if acute symptoms should occur.  Will verify pt continues to use her home O2 day and night to avoid acute breathing or distressful breathing. Will also inquired if pt continues to use her pulse Ox with good readings  Due to pt not able to wear compression stockings will strongly encourage pt to elevate her bilateral lower legs to reduce ongoing swelling in her recliner. Pt aware if her swelling  continues to notify her provider and involved [place] nurse to intervene with her provider for possible wrapping of her lower legs if her provider recommends compression to the sites. Will review the plan of care and adjust goals accordingly to allow adherence. Will schedule next month's home visit with ongoing case management services relate to pt's HF.  [name], RN Care Management Coordinator [place] Office [phone]

## 2016-05-15 DIAGNOSIS — M67811 Other specified disorders of synovium, right shoulder: Secondary | ICD-10-CM | POA: Diagnosis not present

## 2016-05-16 ENCOUNTER — Telehealth: Payer: Self-pay | Admitting: Cardiology

## 2016-05-16 ENCOUNTER — Telehealth: Payer: Self-pay

## 2016-05-16 NOTE — Telephone Encounter (Signed)
Pt advised of UDS results and warning.

## 2016-05-16 NOTE — Telephone Encounter (Signed)
UDS for 05/03/16 encounter with ET was positive for THC.

## 2016-05-16 NOTE — Telephone Encounter (Signed)
Please warn patient, that another inconsistent UDS will mean discharge from the clinic. I will give her one more chance

## 2016-05-16 NOTE — Telephone Encounter (Signed)
Spoke with pt, aware I did not call her. Will forward this note to amber, per other telephone note in the chart from today

## 2016-05-16 NOTE — Telephone Encounter (Signed)
New message ° ° ° ° °Returning a call to the nurse °

## 2016-05-16 NOTE — Progress Notes (Signed)
Urine drug screen for this encounter is inconsistent due to the presence of THC. This is a first occurrence of THC.

## 2016-05-16 NOTE — Telephone Encounter (Signed)
Left message to return call 

## 2016-05-23 DIAGNOSIS — I110 Hypertensive heart disease with heart failure: Secondary | ICD-10-CM | POA: Diagnosis not present

## 2016-05-23 DIAGNOSIS — G8929 Other chronic pain: Secondary | ICD-10-CM | POA: Diagnosis not present

## 2016-05-23 DIAGNOSIS — I5033 Acute on chronic diastolic (congestive) heart failure: Secondary | ICD-10-CM | POA: Diagnosis not present

## 2016-05-23 DIAGNOSIS — I2510 Atherosclerotic heart disease of native coronary artery without angina pectoris: Secondary | ICD-10-CM | POA: Diagnosis not present

## 2016-05-23 DIAGNOSIS — F329 Major depressive disorder, single episode, unspecified: Secondary | ICD-10-CM | POA: Diagnosis not present

## 2016-05-23 DIAGNOSIS — M797 Fibromyalgia: Secondary | ICD-10-CM | POA: Diagnosis not present

## 2016-05-24 ENCOUNTER — Other Ambulatory Visit: Payer: Self-pay | Admitting: *Deleted

## 2016-05-24 NOTE — Patient Outreach (Signed)
[facility]) Care Management  05/24/2016  Linda [name] [birth year] [id number]  RN attempted to follow up with pt today however only able to leave a HIPAA approved voice message.  Pt returned call to RN and left a message to return the call.  TN attempted a second time to reach pt however unsuccessful once again. Will follow up accordingly with the scheduled visit for this month.  [name], RN Care Management Coordinator [place] Office [phone]

## 2016-05-27 ENCOUNTER — Other Ambulatory Visit: Payer: Self-pay | Admitting: *Deleted

## 2016-05-27 ENCOUNTER — Encounter: Payer: Medicare Other | Admitting: Physical Medicine & Rehabilitation

## 2016-05-27 DIAGNOSIS — I5033 Acute on chronic diastolic (congestive) heart failure: Secondary | ICD-10-CM | POA: Diagnosis not present

## 2016-05-27 DIAGNOSIS — G8929 Other chronic pain: Secondary | ICD-10-CM | POA: Diagnosis not present

## 2016-05-27 DIAGNOSIS — I110 Hypertensive heart disease with heart failure: Secondary | ICD-10-CM | POA: Diagnosis not present

## 2016-05-27 DIAGNOSIS — M797 Fibromyalgia: Secondary | ICD-10-CM | POA: Diagnosis not present

## 2016-05-27 DIAGNOSIS — I2510 Atherosclerotic heart disease of native coronary artery without angina pectoris: Secondary | ICD-10-CM | POA: Diagnosis not present

## 2016-05-27 DIAGNOSIS — F329 Major depressive disorder, single episode, unspecified: Secondary | ICD-10-CM | POA: Diagnosis not present

## 2016-05-27 NOTE — Patient Outreach (Signed)
[place]) Care Management  05/27/2016  Linda [name] May 11, 1946 [id number]  Transition of care  Pt returned the call today and indicates today "is not a good day". States due to the watery weather her knees are arching and her BP was elevated this morning on her home cuff. States her visiting [facility] with [facility] has also visited today and retook her BP with an improved readings from earlier today. States she still has a headache and has taken Tylenol. Pt has stronger medicine and may take that to help with her ongoing headache.  Pt unable to recite exactly what her BP was in the last reading with her nurse but much improved from this morning's readout. RN verified pt is aware of what to do if acute symptoms should occur with her BP. RN offered to follow up in few days and provider contact number for [facility] nurse's hotline or this nurse during business hours if needed however strongly encouraged pt to use [facility] after hours nurse if acute issues she arise or if she is unsure of what to do if acute problems should arise. Note pt is an active pt with this RN via [facility] with a scheduled home visit on 6/22 however will follow up on Friday concerning issues pt is having today.   [name], RN Care Management Coordinator [place] Office [phone]

## 2016-05-27 NOTE — Patient Outreach (Signed)
[facility]) Care Management  05/27/2016  Linda [name] [birth year] [id number]   Transition of care  RN attempted outreach call to pt for ongoing transition of care however unsuccessful. Note pt is active and pending a home visit on 6/22 with this RN. Will continue to follow up accordingly with this scheduled home visit.  [name], RN Care Management Coordinator [place] Office [phone]

## 2016-05-28 ENCOUNTER — Telehealth: Payer: Self-pay | Admitting: Family Medicine

## 2016-05-28 NOTE — Telephone Encounter (Signed)
Spoke to pt and offered her an apt with Dr. Raoul Pitch. Pt declined and stated that she came in once before to see Dr. Raoul Pitch and nothing was done. Pt stated that she has had some shortness of breath, HA and lightheadedness. She denied chest pain, n/v and blurred vision. I reviewed this information with Dr. Raoul Pitch who recommended that pt call her cardiologist or go to urgent care or ED. Pt was advised but refused stating that she will just wait til [name] comes back next week. Pt was transferred to [facility] for further triage.

## 2016-05-28 NOTE — Telephone Encounter (Signed)
Per Lattie Haw pt called office and stated that she will go to ER.

## 2016-05-28 NOTE — Telephone Encounter (Signed)
Patient's bp has been running high 189/117, 170/137. Patient has been seeing a cardiology however he does not write her bp med. Please call patient

## 2016-05-28 NOTE — Telephone Encounter (Signed)
Noted  

## 2016-05-28 NOTE — Telephone Encounter (Signed)
Patient Name: Linda [name]  DOB: 1946-07-01    Initial Comment Caller states c/o elevated blood pressure before taking medication in the mornings, shortness of breath, lightheaded, headache   Nurse Assessment  Nurse: Thad Ranger, RN, Den[place] Date/Time (Eastern Time): 05/28/2016 1:22:54 PM  Confirm and document reason for call. If symptomatic, describe symptoms. You must click the next button to save text entered. ---Caller states c/o elevated blood pressure before taking medication in the mornings, shortness of breath, lightheaded, headache. States her BP was 170/137 this am and it now down to 166/107. She cont to have a HA, dizziness and SOB now.  Has the patient traveled out of the country within the last 30 days? ---Not Applicable  Does the patient have any new or worsening symptoms? ---Yes  Will a triage be completed? ---Yes  Related visit to physician within the last 2 weeks? ---No  Does the PT have any chronic conditions? (i.e. diabetes, asthma, etc.) ---Yes  List chronic conditions. ---HTN, CHF, COPD  Is this a behavioral health or substance abuse call? ---No     Guidelines    Guideline Title Affirmed Question Affirmed Notes  High Blood Pressure [1] BP ? 160 / 100 AND [2] cardiac or neurologic symptoms (e.g., chest pain, difficulty breathing, unsteady gait, blurred vision)    Final Disposition User   Go to ED Now [place], RN, Langley Gauss    Comments  Advised to take a list of all of her meds, both Rx and OTC with her to the ER.   Referrals  [facility] - ED   Disagree/Comply: Comply

## 2016-05-29 ENCOUNTER — Telehealth: Payer: Self-pay | Admitting: Cardiology

## 2016-05-29 DIAGNOSIS — M797 Fibromyalgia: Secondary | ICD-10-CM | POA: Diagnosis not present

## 2016-05-29 DIAGNOSIS — I110 Hypertensive heart disease with heart failure: Secondary | ICD-10-CM | POA: Diagnosis not present

## 2016-05-29 DIAGNOSIS — F329 Major depressive disorder, single episode, unspecified: Secondary | ICD-10-CM | POA: Diagnosis not present

## 2016-05-29 DIAGNOSIS — I5033 Acute on chronic diastolic (congestive) heart failure: Secondary | ICD-10-CM | POA: Diagnosis not present

## 2016-05-29 DIAGNOSIS — I2510 Atherosclerotic heart disease of native coronary artery without angina pectoris: Secondary | ICD-10-CM | POA: Diagnosis not present

## 2016-05-29 DIAGNOSIS — G8929 Other chronic pain: Secondary | ICD-10-CM | POA: Diagnosis not present

## 2016-05-29 NOTE — Telephone Encounter (Signed)
New message       Pt c/o swelling: STAT is pt has developed SOB within 24 hours  1. How long have you been experiencing swelling?  Recently noticed it.  Pt states she has gained 5 lbs in 6 days  2. Where is the swelling located? Feet and lower legs 3.  Are you currently taking a "fluid pill"?yes  4.  Are you currently SOB?  A little----but pt states she is sob all of the time 5.  Have you traveled recently?no

## 2016-05-29 NOTE — Telephone Encounter (Signed)
Patient's complaints regarding shortness of breath, LE edema, weight gain were discussed with DOD Dr. Gwenlyn Found  Dr. Gwenlyn Found advised patient to take lasix 80mg  NOW and then increase lasix to 40mg  BID and have an APP appointment this week Informed Dr. Gwenlyn Found patient had BP issues as well, but this is managed by PCP  Patient called with MD recommendations and she voiced understanding/agreed with plan.   Patient scheduled for OV with [place], [place] 05/30/16 @ 1030am. Patient agreeable to this appt date/time. She is aware of location.

## 2016-05-29 NOTE — Telephone Encounter (Signed)
Returned call to patient.  She reports swelling, SOB, and weight gain (5lbs in 6 days) - symptoms lasting over 1 week Patient's shortness of breath is worse today - patient has shortness of breath "anytime" - no correlation w/rest or activity  Patient says if she mashes onto area where her swelling is, it leaves an indention Patient does not eat salty/sodium rich foods Patient takes lasix 40mg  once daily Patient is compliant with all medications  Patient called in to PCP office yesterday about BP - wanted to change to new medication -- was advised to go to ED by PCP office - she states her PCP is out of town -- 189/137 (prior to AM meds) -- 166/107 -- 183/94 -- 176/108 -- 183/94 - after meds 126/74 -- patient states her BP usually comes down to acceptable level - thinks she needs a morning and night BP med - only taking bystolic 5mg   Informed patient that I will have to speak with MD about her concerns and call her back

## 2016-05-30 ENCOUNTER — Other Ambulatory Visit: Payer: Self-pay | Admitting: Physician Assistant

## 2016-05-30 ENCOUNTER — Ambulatory Visit (INDEPENDENT_AMBULATORY_CARE_PROVIDER_SITE_OTHER): Payer: Medicare Other | Admitting: Physician Assistant

## 2016-05-30 ENCOUNTER — Encounter: Payer: Self-pay | Admitting: Physician Assistant

## 2016-05-30 ENCOUNTER — Encounter (HOSPITAL_COMMUNITY): Payer: Self-pay | Admitting: *Deleted

## 2016-05-30 ENCOUNTER — Inpatient Hospital Stay (HOSPITAL_COMMUNITY)
Admission: AD | Admit: 2016-05-30 | Discharge: 2016-06-06 | DRG: 291 | Disposition: A | Discharge status: Skilled Nursing Facility | Payer: Medicare Other | Source: Ambulatory Visit | Attending: Interventional Cardiology | Admitting: Interventional Cardiology

## 2016-05-30 VITALS — BP 120/70 | HR 74 | Ht 64.0 in | Wt 177.6 lb

## 2016-05-30 DIAGNOSIS — J449 Chronic obstructive pulmonary disease, unspecified: Secondary | ICD-10-CM | POA: Diagnosis present

## 2016-05-30 DIAGNOSIS — R0602 Shortness of breath: Secondary | ICD-10-CM

## 2016-05-30 DIAGNOSIS — Z8249 Family history of ischemic heart disease and other diseases of the circulatory system: Secondary | ICD-10-CM

## 2016-05-30 DIAGNOSIS — R609 Edema, unspecified: Secondary | ICD-10-CM

## 2016-05-30 DIAGNOSIS — Z87440 Personal history of urinary (tract) infections: Secondary | ICD-10-CM | POA: Diagnosis not present

## 2016-05-30 DIAGNOSIS — I5033 Acute on chronic diastolic (congestive) heart failure: Secondary | ICD-10-CM

## 2016-05-30 DIAGNOSIS — Z823 Family history of stroke: Secondary | ICD-10-CM

## 2016-05-30 DIAGNOSIS — I2781 Cor pulmonale (chronic): Secondary | ICD-10-CM | POA: Diagnosis present

## 2016-05-30 DIAGNOSIS — Z6830 Body mass index (BMI) 30.0-30.9, adult: Secondary | ICD-10-CM | POA: Diagnosis not present

## 2016-05-30 DIAGNOSIS — I2510 Atherosclerotic heart disease of native coronary artery without angina pectoris: Secondary | ICD-10-CM | POA: Diagnosis present

## 2016-05-30 DIAGNOSIS — Z880 Allergy status to penicillin: Secondary | ICD-10-CM

## 2016-05-30 DIAGNOSIS — R001 Bradycardia, unspecified: Secondary | ICD-10-CM | POA: Diagnosis not present

## 2016-05-30 DIAGNOSIS — R5381 Other malaise: Secondary | ICD-10-CM | POA: Diagnosis present

## 2016-05-30 DIAGNOSIS — B961 Klebsiella pneumoniae [K. pneumoniae] as the cause of diseases classified elsewhere: Secondary | ICD-10-CM | POA: Diagnosis present

## 2016-05-30 DIAGNOSIS — G4700 Insomnia, unspecified: Secondary | ICD-10-CM | POA: Diagnosis present

## 2016-05-30 DIAGNOSIS — I10 Essential (primary) hypertension: Secondary | ICD-10-CM | POA: Diagnosis not present

## 2016-05-30 DIAGNOSIS — J9621 Acute and chronic respiratory failure with hypoxia: Secondary | ICD-10-CM

## 2016-05-30 DIAGNOSIS — I110 Hypertensive heart disease with heart failure: Principal | ICD-10-CM | POA: Diagnosis present

## 2016-05-30 DIAGNOSIS — J9610 Chronic respiratory failure, unspecified whether with hypoxia or hypercapnia: Secondary | ICD-10-CM | POA: Diagnosis present

## 2016-05-30 DIAGNOSIS — G4733 Obstructive sleep apnea (adult) (pediatric): Secondary | ICD-10-CM | POA: Diagnosis present

## 2016-05-30 DIAGNOSIS — R2681 Unsteadiness on feet: Secondary | ICD-10-CM | POA: Diagnosis not present

## 2016-05-30 DIAGNOSIS — R55 Syncope and collapse: Secondary | ICD-10-CM | POA: Diagnosis present

## 2016-05-30 DIAGNOSIS — E785 Hyperlipidemia, unspecified: Secondary | ICD-10-CM | POA: Diagnosis not present

## 2016-05-30 DIAGNOSIS — E669 Obesity, unspecified: Secondary | ICD-10-CM | POA: Diagnosis present

## 2016-05-30 DIAGNOSIS — Z90710 Acquired absence of both cervix and uterus: Secondary | ICD-10-CM | POA: Diagnosis not present

## 2016-05-30 DIAGNOSIS — Z5189 Encounter for other specified aftercare: Secondary | ICD-10-CM | POA: Diagnosis not present

## 2016-05-30 DIAGNOSIS — J9620 Acute and chronic respiratory failure, unspecified whether with hypoxia or hypercapnia: Secondary | ICD-10-CM | POA: Diagnosis present

## 2016-05-30 DIAGNOSIS — R600 Localized edema: Secondary | ICD-10-CM | POA: Diagnosis not present

## 2016-05-30 DIAGNOSIS — M6281 Muscle weakness (generalized): Secondary | ICD-10-CM | POA: Diagnosis not present

## 2016-05-30 DIAGNOSIS — I499 Cardiac arrhythmia, unspecified: Secondary | ICD-10-CM | POA: Diagnosis not present

## 2016-05-30 DIAGNOSIS — Z9981 Dependence on supplemental oxygen: Secondary | ICD-10-CM

## 2016-05-30 DIAGNOSIS — I5032 Chronic diastolic (congestive) heart failure: Secondary | ICD-10-CM | POA: Diagnosis present

## 2016-05-30 DIAGNOSIS — R0600 Dyspnea, unspecified: Secondary | ICD-10-CM | POA: Diagnosis not present

## 2016-05-30 DIAGNOSIS — N390 Urinary tract infection, site not specified: Secondary | ICD-10-CM | POA: Diagnosis present

## 2016-05-30 DIAGNOSIS — I509 Heart failure, unspecified: Secondary | ICD-10-CM | POA: Diagnosis not present

## 2016-05-30 DIAGNOSIS — M7989 Other specified soft tissue disorders: Secondary | ICD-10-CM | POA: Diagnosis not present

## 2016-05-30 LAB — COMPREHENSIVE METABOLIC PANEL
ALBUMIN: 3.5 g/dL (ref 3.5–5.0)
ALK PHOS: 75 U/L (ref 38–126)
ALT: 15 U/L (ref 14–54)
AST: 19 U/L (ref 15–41)
Anion gap: 7 (ref 5–15)
BILIRUBIN TOTAL: 0.7 mg/dL (ref 0.3–1.2)
BUN: 9 mg/dL (ref 6–20)
CO2: 33 mmol/L — AB (ref 22–32)
Calcium: 9.1 mg/dL (ref 8.9–10.3)
Chloride: 102 mmol/L (ref 101–111)
Creatinine, Ser: 0.85 mg/dL (ref 0.44–1.00)
GFR calc Af Amer: >60 mL/min (ref >60)
GFR calc non Af Amer: >60 mL/min (ref >60)
GLUCOSE: 96 mg/dL (ref 65–99)
POTASSIUM: 3.7 mmol/L (ref 3.5–5.1)
SODIUM: 142 mmol/L (ref 135–145)
TOTAL PROTEIN: 6.5 g/dL (ref 6.5–8.1)

## 2016-05-30 LAB — PROTIME-INR
INR: 1.07 (ref 0.00–1.49)
Prothrombin Time: 14.1 seconds (ref 11.6–15.2)

## 2016-05-30 LAB — TROPONIN I

## 2016-05-30 LAB — CBC WITH DIFFERENTIAL/PLATELET
BASOS ABS: 0 10*3/uL (ref 0.0–0.1)
BASOS PCT: 0 %
Eosinophils Absolute: 0.1 10*3/uL (ref 0.0–0.7)
Eosinophils Relative: 1 %
HEMATOCRIT: 37.2 % (ref 36.0–46.0)
HEMOGLOBIN: 11.8 g/dL — AB (ref 12.0–15.0)
Lymphocytes Relative: 28 %
Lymphs Abs: 1.7 10*3/uL (ref 0.7–4.0)
MCH: 28.5 pg (ref 26.0–34.0)
MCHC: 31.7 g/dL (ref 30.0–36.0)
MCV: 89.9 fL (ref 78.0–100.0)
Monocytes Absolute: 0.6 10*3/uL (ref 0.1–1.0)
Monocytes Relative: 11 %
NEUTROS ABS: 3.5 10*3/uL (ref 1.7–7.7)
NEUTROS PCT: 60 %
Platelets: 171 10*3/uL (ref 150–400)
RBC: 4.14 MIL/uL (ref 3.87–5.11)
RDW: 13.1 % (ref 11.5–15.5)
WBC: 5.9 10*3/uL (ref 4.0–10.5)

## 2016-05-30 LAB — BRAIN NATRIURETIC PEPTIDE: B NATRIURETIC PEPTIDE 5: 87.9 pg/mL (ref 0.0–100.0)

## 2016-05-30 MED ORDER — ONDANSETRON HCL 4 MG/2ML IJ SOLN
4.0000 mg | Freq: Four times a day (QID) | INTRAMUSCULAR | Status: DC | PRN
  Administered 2016-06-01: 4 mg via INTRAVENOUS
  Filled 2016-05-30: qty 2

## 2016-05-30 MED ORDER — POTASSIUM CHLORIDE CRYS ER 20 MEQ PO TBCR: 40.0000 meq | EXTENDED_RELEASE_TABLET | Freq: Two times a day (BID) | ORAL | Status: DC

## 2016-05-30 MED ORDER — ALPRAZOLAM 0.5 MG PO TABS
1.0000 mg | ORAL_TABLET | Freq: Three times a day (TID) | ORAL | Status: DC | PRN
  Administered 2016-05-30 – 2016-06-06 (×15): 1 mg via ORAL
  Filled 2016-05-30 (×15): qty 2

## 2016-05-30 MED ORDER — OXYCODONE HCL 5 MG PO TABS
5.0000 mg | ORAL_TABLET | Freq: Three times a day (TID) | ORAL | Status: DC | PRN
  Administered 2016-05-30 – 2016-06-06 (×16): 5 mg via ORAL
  Filled 2016-05-30 (×17): qty 1

## 2016-05-30 MED ORDER — ARIPIPRAZOLE 5 MG PO TABS: 10.0000 mg | ORAL_TABLET | Freq: Every day | ORAL | Status: DC

## 2016-05-30 MED ORDER — ALBUTEROL SULFATE (2.5 MG/3ML) 0.083% IN NEBU: 3.0000 mL | INHALATION_SOLUTION | Freq: Four times a day (QID) | RESPIRATORY_TRACT | Status: DC | PRN

## 2016-05-30 MED ORDER — PANTOPRAZOLE SODIUM 40 MG PO TBEC: 40.0000 mg | DELAYED_RELEASE_TABLET | Freq: Every day | ORAL | Status: DC

## 2016-05-30 MED ORDER — MOMETASONE FURO-FORMOTEROL FUM 200-5 MCG/ACT IN AERO: 2.0000 | INHALATION_SPRAY | Freq: Two times a day (BID) | RESPIRATORY_TRACT | Status: DC

## 2016-05-30 MED ORDER — ATORVASTATIN CALCIUM 20 MG PO TABS
20.0000 mg | ORAL_TABLET | Freq: Every day | ORAL | Status: DC
Start: 2016-05-30 — End: 2016-06-06
  Administered 2016-05-30 – 2016-06-05 (×7): 20 mg via ORAL
  Filled 2016-05-30 (×7): qty 1

## 2016-05-30 MED ORDER — ADULT MULTIVITAMIN W/MINERALS CH: 1.0000 | ORAL_TABLET | Freq: Every day | ORAL | Status: DC

## 2016-05-30 MED ORDER — HEPARIN SODIUM (PORCINE) 5000 UNIT/ML IJ SOLN
5000.0000 [IU] | Freq: Three times a day (TID) | INTRAMUSCULAR | Status: DC
  Administered 2016-05-30 – 2016-06-06 (×19): 5000 [IU] via SUBCUTANEOUS
  Filled 2016-05-30 (×19): qty 1

## 2016-05-30 MED ORDER — POLYETHYLENE GLYCOL 3350 17 G PO PACK
17.0000 g | PACK | Freq: Every day | ORAL | Status: DC | PRN
  Administered 2016-06-03 – 2016-06-05 (×2): 17 g via ORAL
  Filled 2016-05-30 (×2): qty 1

## 2016-05-30 MED ORDER — VITAMIN D3 1.25 MG (50000 UT) PO CAPS: 1.0000 | ORAL_CAPSULE | ORAL | Status: DC

## 2016-05-30 MED ORDER — METOPROLOL TARTRATE 12.5 MG HALF TABLET
12.5000 mg | ORAL_TABLET | Freq: Two times a day (BID) | ORAL | Status: DC
  Administered 2016-05-30 – 2016-06-06 (×14): 12.5 mg via ORAL
  Filled 2016-05-30 (×14): qty 1

## 2016-05-30 MED ORDER — IBUPROFEN 800 MG PO TABS
800.0000 mg | ORAL_TABLET | Freq: Every day | ORAL | Status: DC | PRN
  Administered 2016-06-02: 800 mg via ORAL
  Filled 2016-05-30 (×3): qty 1

## 2016-05-30 MED ORDER — OXYCODONE-ACETAMINOPHEN 10-325 MG PO TABS: 1.0000 | ORAL_TABLET | Freq: Three times a day (TID) | ORAL | Status: DC | PRN

## 2016-05-30 MED ORDER — TRAZODONE HCL 100 MG PO TABS
100.0000 mg | ORAL_TABLET | Freq: Once | ORAL | Status: AC
  Administered 2016-05-31: 100 mg via ORAL
  Filled 2016-05-30: qty 1

## 2016-05-30 MED ORDER — ADULT MULTIVITAMIN W/MINERALS CH
1.0000 | ORAL_TABLET | Freq: Every day | ORAL | Status: DC
  Administered 2016-05-31 – 2016-06-06 (×7): 1 via ORAL
  Filled 2016-05-30 (×7): qty 1

## 2016-05-30 MED ORDER — ISOSORBIDE MONONITRATE ER 30 MG PO TB24
30.0000 mg | ORAL_TABLET | Freq: Every day | ORAL | Status: DC
  Administered 2016-05-31 – 2016-06-06 (×7): 30 mg via ORAL
  Filled 2016-05-30 (×7): qty 1

## 2016-05-30 MED ORDER — FUROSEMIDE 10 MG/ML IJ SOLN
40.0000 mg | Freq: Two times a day (BID) | INTRAMUSCULAR | Status: DC
  Administered 2016-05-30 – 2016-06-01 (×4): 40 mg via INTRAVENOUS
  Filled 2016-05-30 (×4): qty 4

## 2016-05-30 MED ORDER — PREGABALIN 100 MG PO CAPS: 300.0000 mg | ORAL_CAPSULE | Freq: Two times a day (BID) | ORAL | Status: DC

## 2016-05-30 MED ORDER — MOMETASONE FURO-FORMOTEROL FUM 200-5 MCG/ACT IN AERO
2.0000 | INHALATION_SPRAY | Freq: Two times a day (BID) | RESPIRATORY_TRACT | Status: DC
  Administered 2016-05-31 – 2016-06-06 (×10): 2 via RESPIRATORY_TRACT
  Filled 2016-05-30 (×2): qty 8.8

## 2016-05-30 MED ORDER — NITROGLYCERIN 0.4 MG SL SUBL: 0.4000 mg | SUBLINGUAL_TABLET | SUBLINGUAL | Status: DC | PRN

## 2016-05-30 MED ORDER — DULOXETINE HCL 30 MG PO CPEP
90.0000 mg | ORAL_CAPSULE | Freq: Every day | ORAL | Status: DC
  Administered 2016-05-30 – 2016-06-05 (×7): 90 mg via ORAL
  Filled 2016-05-30 (×7): qty 3

## 2016-05-30 MED ORDER — SODIUM CHLORIDE 0.9% FLUSH
3.0000 mL | Freq: Two times a day (BID) | INTRAVENOUS | Status: DC
  Administered 2016-05-30 – 2016-06-06 (×15): 3 mL via INTRAVENOUS

## 2016-05-30 MED ORDER — TRAZODONE HCL 50 MG PO TABS
50.0000 mg | ORAL_TABLET | Freq: Every day | ORAL | Status: DC
  Administered 2016-05-30: 50 mg via ORAL
  Filled 2016-05-30 (×2): qty 1

## 2016-05-30 MED ORDER — DULOXETINE HCL 60 MG PO CPEP: 60.0000 mg | ORAL_CAPSULE | Freq: Every day | ORAL | Status: DC

## 2016-05-30 MED ORDER — POTASSIUM CHLORIDE CRYS ER 20 MEQ PO TBCR
40.0000 meq | EXTENDED_RELEASE_TABLET | Freq: Two times a day (BID) | ORAL | Status: DC
  Administered 2016-05-30 – 2016-06-04 (×10): 40 meq via ORAL
  Filled 2016-05-30 (×10): qty 2

## 2016-05-30 MED ORDER — FOLIC ACID 1 MG PO TABS: 2.0000 mg | ORAL_TABLET | Freq: Every day | ORAL | Status: DC

## 2016-05-30 MED ORDER — PANTOPRAZOLE SODIUM 40 MG PO TBEC
40.0000 mg | DELAYED_RELEASE_TABLET | Freq: Every day | ORAL | Status: DC
  Administered 2016-05-31 – 2016-06-06 (×7): 40 mg via ORAL
  Filled 2016-05-30 (×7): qty 1

## 2016-05-30 MED ORDER — SODIUM CHLORIDE 0.9% FLUSH: 3.0000 mL | INTRAVENOUS | Status: DC | PRN

## 2016-05-30 MED ORDER — OXYCODONE-ACETAMINOPHEN 5-325 MG PO TABS
1.0000 | ORAL_TABLET | Freq: Three times a day (TID) | ORAL | Status: DC | PRN
  Administered 2016-05-30 – 2016-06-06 (×17): 1 via ORAL
  Filled 2016-05-30 (×19): qty 1

## 2016-05-30 MED ORDER — VITAMIN D (ERGOCALCIFEROL) 1.25 MG (50000 UNIT) PO CAPS
50000.0000 [IU] | ORAL_CAPSULE | ORAL | Status: DC
  Administered 2016-06-03: 50000 [IU] via ORAL
  Filled 2016-05-30: qty 1

## 2016-05-30 MED ORDER — PREGABALIN 100 MG PO CAPS
300.0000 mg | ORAL_CAPSULE | Freq: Two times a day (BID) | ORAL | Status: DC
  Administered 2016-05-30 – 2016-06-03 (×8): 300 mg via ORAL
  Administered 2016-06-03: 100 mg via ORAL
  Filled 2016-05-30 (×10): qty 3

## 2016-05-30 MED ORDER — FOLIC ACID 1 MG PO TABS
2.0000 mg | ORAL_TABLET | Freq: Every day | ORAL | Status: DC
  Administered 2016-05-31 – 2016-06-06 (×7): 2 mg via ORAL
  Filled 2016-05-30 (×7): qty 2

## 2016-05-30 MED ORDER — ASPIRIN EC 81 MG PO TBEC
81.0000 mg | DELAYED_RELEASE_TABLET | Freq: Every day | ORAL | Status: DC
  Administered 2016-05-30 – 2016-06-05 (×7): 81 mg via ORAL
  Filled 2016-05-30 (×7): qty 1

## 2016-05-30 MED ORDER — SODIUM CHLORIDE 0.9 % IV SOLN: 250.0000 mL | INTRAVENOUS | Status: DC | PRN

## 2016-05-30 MED ORDER — ARIPIPRAZOLE 5 MG PO TABS
10.0000 mg | ORAL_TABLET | Freq: Every day | ORAL | Status: DC
  Administered 2016-05-31 – 2016-06-06 (×7): 10 mg via ORAL
  Filled 2016-05-30 (×7): qty 2

## 2016-05-30 MED ORDER — ACETAMINOPHEN 325 MG PO TABS: 650.0000 mg | ORAL_TABLET | ORAL | Status: DC | PRN

## 2016-05-30 NOTE — Progress Notes (Signed)
CARDIOLOGY OFFICE NOTE  Date:  05/30/2016    [name] Date of Birth: 05/29/1946 Medical Record [id number]  PCP:  Tammi Sou, MD  Cardiologist:  Dr. Stanford Breed  (previously followed by Dr. Terrence Dupont)  Chief Complaint  Patient presents with  . Follow-up    seen for Dr. Stanford Breed for worsening SOB and LE edema    History of Present Illness: Linda [name] is a 70 y.o. female who presents today for a cardiology office visit. Patient has history of nonobstructive CAD in 01/2013, severe MR, history of presumed orthostatic syncope, HTN. His last cardiac catheterization in 01/2013 showed EF 60-65%, 4+ MR, patent left main, 15-20% mid LAD stenosis, 20-30% ostial D2 stenosis, 40-50% OM1 stenosis., 15-20% OM 2 stenosis, 50-60% mid RCA stenosis. Interestingly, mild echocardiogram since that time only shows mild MR. Last echocardiogram obtained on 08/24/2015 showed EF [id number], grade 1 diastolic dysfunction.  Patient was admitted in April 2017 for CHF and underwent diuresis. He was seen in the clinic on 05/06/2016, at which time he had multiple complaints including persistent dyspnea and chest heaviness both at rest and exertion. She also had labile blood pressure as well. As for chest pain, was felt that she does not need any further ischemic workup given nonischemic EKG. She contacted cardiology office on 05/29/2016 due to complaining of persistent dyspnea, lower extremity edema, weight gain, orthopnea, PND and nonproductive cough. She was instructed to increase her Lasix to 40 mg twice a day. She presents today for cardiology office evaluation in the flex clinic.  He continued to have persistent shortness of breath and chest discomfort with and without exertion. She says she has gained roughly 6 pounds in the last few days. Based on our record, she has gained roughly 4 pounds compared to the last office visit and 9 pounds compared to the previous discharge weight in April. She appears to be  dyspneic in the clinic. She has 2-3+ pitting edema in the lower extremities. She did notice increased urinary output since increasing Lasix dose yesterday, however has not caused any reasonable improvement in her breathing at this time. She lives alone and drove to the clinic today. She also says her blood pressure has been labile and wish to consider a twice a day dosing for her blood pressure medication. She says her systolic blood pressure is usually greater than 180 prior to taking her blood pressure medication however it returned back to normal after she takes it.     Past Medical History  Diagnosis Date  . HTN (hypertension)   . Depression   . Recurrent UTI     hx of hospitalization for pyelonephritis; started abx prophylaxis 06/2015  . Hay fever   . Mixed incontinence urge and stress   . Diverticular disease   . Insomnia   . Fibromyalgia     Patient states dx was around her late 29s but she had sx's for years prior to this.  . Syncope     Hypotensive; ED visit--Dr. Terrence Dupont did Cath--nonobstructive CAD, EF 55-60%.  In retrospect, suspect pt rx med misuse/polypharmacy  . Idiopathic angio-edema-urticaria 72014    Angioedema component was very minimal  . Asthma     w/ asbestososis   . History of pneumonia     hospitalized 12/2011, 02/2013, and 07/2013 [facility]) for this  . Anginal pain ([place])     Nonobstructive CAD 2014; however, her cardiologist put her on a statin for this and NOT for hyperlipidemia per pt report.  [name] Kitchen  OSA on CPAP     prior to move to [place]--had another sleep study 10/2015 w/pulm Dr. Camillo Flaming.  . H/O hiatal hernia   . Migraine syndrome     "not as often anymore; used to be ~ q wk" (07/12/2013)  . Tension headache, chronic   . DDD (degenerative disc disease)     lumbar and cervical.   . Osteoarthritis     "severe; progressing fast" (07/12/2013); multiple joints-not surgical candidate for TKR (03/2015)  . Chronic lower back pain   . Anxiety     panic attacks  . Nephrolithiasis      "passed all on my own or they are still in there" (07/12/2013)  . Pyelonephritis     "several times over the last 30 yr" (07/12/2013)  . Diastolic congestive heart failure ([place])   . COPD (chronic obstructive pulmonary disease) ([place])   . Pulmonary embolism ([place]) 07/2013    Dx at [facility] with very small peripheral upper lobe pe 07/2013: pt took coumadin for about 8-9 mo  . Pleural plaque with presence of asbestos 07/22/2013  . BPPV (benign paroxysmal positional vertigo) 12/16/2012  . RBBB (right bundle branch block)   . Acute upper GI bleed 06/2014    while pt taking coumadin, plavix, and meloxicam---despite being told not to take coumadin.  . Iron deficiency anemia     Hematologist in [place], [place] did extensive w/u; no cause found; failed oral supplement;; gets fairly regular (q62m or so) IV iron infusions (Venofer -iron sucrose- 200mg  with procrit.  "for 14 yr I've been getting blood work q month & getting infusions prn" (07/12/2013).  Dr. Marin Olp locally, iron infusions done, EPO deficiency dx'd  . Pernicious anemia 08/24/2014    Past Surgical History  Procedure Laterality Date  . Appendectomy  1960  . Total abdominal hysterectomy  1974  . Tendon release  1996    Right forearm and hand  . Knee surgery  2005  . Heel spur surgery Left 2008  . Plantar fascia release Left 2008  . Axillary surgery Left 1978    Multiple "lump" in armpit per pt  . Coccyx removal  1972  . Cardiac catheterization  01/2013    nonobstructive CAD, EF 55-60%  . Transthoracic echocardiogram  01/2013; 04/2014;08/2015    2014--NORMAL.  2015--focal basal septal hypertrophy, EF 55-60%, grade I diast dysfxn, mild LAE.  08/2015 EF 55-60%, nl LV syst fxn, grade I DD, valves wnl  . Dilation and curettage of uterus  ? 1970's  . Eye surgery Left 2012-2013    "injections for ~ 1 yr; don't really know what for" (07/12/2013)  . Spirometry  04/25/14    In hosp for acute asthma/COPD flare: mixed obstructive and restrictive lung disease.  The FEV1 is severely reduced at 45% predicted.  FEV1 signif decreased compared to prior spirometry 07/23/13.  [name] Kitchen Esophagogastroduodenoscopy N/A 07/19/2014    Gastritis found + in the setting of supratherapeutic INR, +plavix, + meloxicam.  . Left heart catheterization with coronary angiogram N/A 01/30/2013    Procedure: LEFT HEART CATHETERIZATION WITH CORONARY ANGIOGRAM;  Surgeon: Clent Demark, MD;  Location[facility] CATH LAB;  Service: Cardiovascular;  Laterality: N/A;  . Cardiovascular stress test  02/22/15    Low risk myocard perf imaging; wall motion normal, normal EF     Medications: Current Outpatient Prescriptions  Medication Sig Dispense Refill  . albuterol (PROVENTIL HFA;VENTOLIN HFA) 108 (90 BASE) MCG/ACT inhaler Inhale 2 puffs into the lungs every 6 (six) hours as needed  for wheezing or shortness of breath. 1 Inhaler 1  . ALPRAZolam (XANAX) 1 MG tablet Take 1 tablet (1 mg total) by mouth 3 (three) times daily as needed for anxiety. 90 tablet 5  . ARIPiprazole (ABILIFY) 10 MG tablet Take 1 tablet (10 mg total) by mouth daily. 30 tablet 2  . aspirin 81 MG tablet Take 81 mg by mouth at bedtime.    [name] Kitchen atorvastatin (LIPITOR) 20 MG tablet Take 20 mg by mouth at bedtime.    . budesonide-formoterol (SYMBICORT) 160-4.5 MCG/ACT inhaler Inhale 2 puffs into the lungs 2 (two) times daily. 1 Inhaler 12  . Cholecalciferol (VITAMIN D3) 50000 units CAPS Take 1 capsule by mouth every Monday.     . diclofenac sodium (VOLTAREN) 1 % GEL Apply 1 application topically 3 (three) times daily as needed (pain).     . DULoxetine (CYMBALTA) 30 MG capsule Take 30 mg by mouth daily. TAKE 1 CAPSULE BY MOUTH DAILY AT BEDTIME - WITH A 60 MG CAPSULE FOR A 90 MG DOSE    . DULoxetine (CYMBALTA) 60 MG capsule Take 60 mg by mouth at bedtime. Take with a 30 mg capsule for a 90 mg dose    . folic acid (FOLVITE) 1 MG tablet Take 2 tablets (2 mg total) by mouth daily. 60 tablet 6  . furosemide (LASIX) 40 MG tablet Take 40 mg by mouth  daily.    [name] Kitchen ibuprofen (ADVIL,MOTRIN) 800 MG tablet Take 800 mg by mouth daily as needed (pain).    . isosorbide mononitrate (IMDUR) 30 MG 24 hr tablet Take 30 mg by mouth daily.    . Misc Natural Products (OSTEO BI-FLEX ADV JOINT SHIELD) TABS Take 1 tablet by mouth daily.    . Multiple Vitamin (MULTIVITAMIN WITH MINERALS) TABS tablet Take 1 tablet by mouth daily.    . nebivolol (BYSTOLIC) 5 MG tablet Take 5 mg by mouth daily.    . nitroGLYCERIN (NITROSTAT) 0.4 MG SL tablet Place 0.4 mg under the tongue every 5 (five) minutes as needed for chest pain (x 3 doses).     . ondansetron (ZOFRAN-ODT) 4 MG disintegrating tablet Take 4 mg by mouth every 8 (eight) hours as needed for nausea or vomiting.    [name] Kitchen oxyCODONE-acetaminophen (PERCOCET) 10-325 MG tablet Take 1 tablet by mouth every 8 (eight) hours as needed for pain. ONE MONTH SUPPLY 100 tablet 0  . OXYGEN Inhale 3 L into the lungs continuous.    . pantoprazole (PROTONIX) 40 MG tablet Take 1 tablet (40 mg total) by mouth daily. 90 tablet 1  . polyethylene glycol (MIRALAX / GLYCOLAX) packet Take 17 g by mouth daily as needed (constipation). Mix in 8 oz liquid and drink    . potassium chloride SA (K-DUR,KLOR-CON) 20 MEQ tablet Take 20-40 mEq by mouth 2 (two) times daily. Take 2 tablets (40 meq) by mouth every morning and 1 tablet (20 meq) at night    . pregabalin (LYRICA) 300 MG capsule Take 300 mg by mouth 2 (two) times daily.    . traZODone (DESYREL) 50 MG tablet Take 50 mg by mouth at bedtime. INSOMNIA     No current facility-administered medications for this visit.   Facility-Administered Medications Ordered in Other Visits  Medication Dose Route Frequency Provider Last Rate Last Dose  . ferumoxytol (FERAHEME) 510 mg in sodium chloride 0.9 % 100 mL IVPB  510 mg Intravenous Once Volanda Napoleon, MD        Allergies: Allergies  Allergen Reactions  .  Penicillins Itching, Swelling and Rash    Tolerated Cefepime in ED. Has patient had a PCN  reaction causing immediate rash, facial/tongue/throat swelling, SOB or lightheadedness with hypotension: Yes Has patient had a PCN reaction causing severe rash involving mucus membranes or skin necrosis: No Has patient had a PCN reaction that required hospitalization: No  Has patient had a PCN reaction occurring within the last 10 years: No     Social History: The patient  reports that she has never smoked. She has never used smokeless tobacco. She reports that she does not drink alcohol or use illicit drugs.   Family History: The patient's family history includes Arthritis in her mother; Diabetes in her father; Heart attack in her father and paternal grandmother; Heart disease in her father; Hypertension in her father; Kidney disease in her mother; Stroke in her father.   Review of Systems: Please see the history of present illness.   Otherwise, the review of systems is positive for SOB, DOE, PND.   All other systems are reviewed and negative.   Physical Exam: VS:  BP 120/70 mmHg  Pulse 74  Ht 5\' 4"  (1.626 m)  Wt 177 lb 9.6 oz (80.559 kg)  BMI 30.47 kg/m2  SpO2 96% .  BMI Body mass index is 30.47 kg/(m^2).  Wt Readings from Last 3 Encounters:  05/30/16 177 lb 9.6 oz (80.559 kg)  05/14/16 172 lb 9.6 oz (78.291 kg)  05/06/16 173 lb (78.472 kg)    General: Pleasant. Well developed, well nourished and in no acute distress.  HEENT: Normal. Neck: Supple, no JVD, carotid bruits, or masses noted.  Cardiac: Regular rate and rhythm. No murmurs, rubs, or gallops. +2-3 edema.  Respiratory:  Bibasilar rale with diminished breath sound, R > L GI: Soft and nontender.  MS: No deformity or atrophy. Gait and ROM intact. Skin: Warm and dry. Color is normal.  Neuro:  Strength and sensation are intact and no gross focal deficits noted.  Psych: Alert, appropriate and with normal affect.   LABORATORY DATA:  EKG:  EKG is ordered today. This demonstrates chronic RBBB.  Lab Results  Component  Value Date   WBC 6.1 04/08/2016   HGB 13.9 04/08/2016   HCT 42.5 04/08/2016   PLT 134* 04/08/2016   GLUCOSE 122* 04/11/2016   CHOL 152 01/31/2013   TRIG 92 01/31/2013   HDL 44 01/31/2013   LDLCALC 90 01/31/2013   ALT 13* 04/08/2016   AST 18 04/08/2016   NA 141 04/11/2016   K 4.1 04/11/2016   CL 100* 04/11/2016   CREATININE 0.85 04/11/2016   BUN 14 04/11/2016   CO2 29 04/11/2016   TSH 1.002 04/08/2016   INR 1.05 04/08/2016   HGBA1C 5.6 01/02/2016    BNP (last 3 results)  Recent Labs  10/16/15 1500 10/27/15 1602 04/08/16 1800  BNP 15.4 19.9 30.2    ProBNP (last 3 results)  Recent Labs  08/24/15 1105  PROBNP 92.0     Other Studies Reviewed Today:  Myoview 2015-03-21 IMPRESSION: 1. No reversible ischemia or infarction.  2. Normal left ventricular wall motion.  3. Left ventricular ejection fraction 96%. Ejection fractions are typical overestimated with this technique and small cardiac volumes.  4. Low-risk stress test findings*.    Echo 08/24/2015 LV EF: 60% - 65%  ------------------------------------------------------------------- Indications: Chest Pain (R07.9).  ------------------------------------------------------------------- History: PMH: Right Bundle Branch Block, Asthma, Obstructive Sleep Apnea with CPAP. Syncope and dyspnea. Congestive heart failure. Chronic obstructive pulmonary disease. Risk factors: Family history  of coronary artery disease. Hypertension.  ------------------------------------------------------------------- Study Conclusions  - Left ventricle: The cavity size was normal. Systolic function was  normal. The estimated ejection fraction was in the range of 60%  to 65%. Wall motion was normal; there were no regional wall  motion abnormalities. There was an increased relative  contribution of atrial contraction to ventricular filling.  Doppler parameters are consistent with abnormal left  ventricular  relaxation (grade 1 diastolic dysfunction). - Tricuspid valve: There was trivial regurgitation.   Cath 01/2013     Assessment/Plan:  1. Acute on chronic respiratory failure: likely multifactorial with underlying COPD/abestosis and acute HF  - see #2  2. Acute on chronic diastolic HF  - her weight is 9 lbs > than her discharge weight in Apr, and 4 lbs higher since last office visit. She has been compliant with Na restriction, bu she does drink 4 bottles of water  - she has been having weight gain, LE edema, orthopnea, PND and cough for several days  - she lives alone, and has not noticed significant improvement after an increased dose of lasix. Fortunately her O2 sat on 3L oxygen is 96%   - I discussed with DOD Dr. Tamala Julian, she does appears to be in acute HF and appears very dyspneic. After discussing various option with her regarding close outpatient obs in 3 days vs direct admit, she is agreeable for direct admission for IV diuresis which I think is reasonable given the degree of swelling and dyspenia  - CXR, BNP, CMP, CBC, CXR, start IV lasix 40mg  BID  3. Chest pressure:   - previous cath 01/2013 showed EF 60-65%, 4+ MR, patent left main, 15-20% mid LAD stenosis, 20-30% ostial D2 stenosis, 40-50% OM1 stenosis., 15-20% OM 2 stenosis, 50-60% mid RCA stenosis  - unclear if related to HF, she has a negative myoview on 02/22/2015   4. COPD  5. Label HTN  - per patient, her BP usually run in 180s at home before her meds and back to normal after medication.  - will change bystolic to metoprolol BID dosing to make her BP more even    Current medicines are reviewed with the patient today.  The patient does not have concerns regarding medicines other than what has been noted above.  The following changes have been made:  See above.  Labs/ tests ordered today include:    Orders Placed This Encounter  Procedures  . EKG 12-Lead     Disposition:   FU with Dr. Stanford Breed in 3  months.   Patient is agreeable to this plan and will call if any problems develop in the interim.   Signed: Almyra Deforest PA-C 05/30/2016 11:41 AM  [facility] [address] [place], [place]  [address] Phone: [phone] Fax: [phone]      The patient has been seen in conjunction with Almyra Deforest, PAC. All aspects of care have been considered and discussed. The patient has been personally interviewed, examined, and all clinical data has been reviewed.   Severe chronic O2 dependent respiratory failure with increasing dyspnea and volume overload. Unable to lie down due to dyspnea.  Admit to improve respiratory status via diuresis and perhaps pulmonary bronchodilator therapy. Overall, this may be more Cor Pulmonale than LV diastolic failure.  Overall poor prognosis.

## 2016-05-30 NOTE — H&P (Signed)
CARDIOLOGY OFFICE NOTE  Date:  05/30/2016    [name] Date of Birth: July 25, 1946 Medical Record [id number]  PCP:  Tammi Sou, MD  Cardiologist:  Dr. Stanford Breed  (previously followed by Dr. Terrence Dupont)  No chief complaint on file.   History of Present Illness: Linda [name] is a 70 y.o. female who presents today for a cardiology office visit. Patient has history of nonobstructive CAD in 01/2013, severe MR, history of presumed orthostatic syncope, HTN. His last cardiac catheterization in 01/2013 showed EF 60-65%, 4+ MR, patent left main, 15-20% mid LAD stenosis, 20-30% ostial D2 stenosis, 40-50% OM1 stenosis., 15-20% OM 2 stenosis, 50-60% mid RCA stenosis. Interestingly, mild echocardiogram since that time only shows mild MR. Last echocardiogram obtained on 08/24/2015 showed EF 123456, grade 1 diastolic dysfunction.  Patient was admitted in April 2017 for CHF and underwent diuresis. He was seen in the clinic on 05/06/2016, at which time he had multiple complaints including persistent dyspnea and chest heaviness both at rest and exertion. She also had labile blood pressure as well. As for chest pain, was felt that she does not need any further ischemic workup given nonischemic EKG. She contacted cardiology office on 05/29/2016 due to complaining of persistent dyspnea, lower extremity edema, weight gain, orthopnea, PND and nonproductive cough. She was instructed to increase her Lasix to 40 mg twice a day. She presents today for cardiology office evaluation in the flex clinic.  He continued to have persistent shortness of breath and chest discomfort with and without exertion. She says she has gained roughly 6 pounds in the last few days. Based on our record, she has gained roughly 4 pounds compared to the last office visit and 9 pounds compared to the previous discharge weight in April. She appears to be dyspneic in the clinic. She has 2-3+ pitting edema in the lower extremities. She did  notice increased urinary output since increasing Lasix dose yesterday, however has not caused any reasonable improvement in her breathing at this time. She lives alone and drove to the clinic today. She also says her blood pressure has been labile and wish to consider a twice a day dosing for her blood pressure medication. She says her systolic blood pressure is usually greater than 180 prior to taking her blood pressure medication however it returned back to normal after she takes it.     Past Medical History  Diagnosis Date  . HTN (hypertension)   . Depression   . Recurrent UTI     hx of hospitalization for pyelonephritis; started abx prophylaxis 06/2015  . Hay fever   . Mixed incontinence urge and stress   . Diverticular disease   . Insomnia   . Fibromyalgia     Patient states dx was around her late 63s but she had sx's for years prior to this.  . Syncope     Hypotensive; ED visit--Dr. Terrence Dupont did Cath--nonobstructive CAD, EF 55-60%.  In retrospect, suspect pt rx med misuse/polypharmacy  . Idiopathic angio-edema-urticaria 72014    Angioedema component was very minimal  . Asthma     w/ asbestososis   . History of pneumonia     hospitalized 12/2011, 02/2013, and 07/2013 [facility]) for this  . Anginal pain ([place])     Nonobstructive CAD 2014; however, her cardiologist put her on a statin for this and NOT for hyperlipidemia per pt report.  . OSA on CPAP     prior to move to [place]--had another sleep study 10/2015  w/pulm Dr. Camillo Flaming.  . H/O hiatal hernia   . Migraine syndrome     "not as often anymore; used to be ~ q wk" (07/12/2013)  . Tension headache, chronic   . DDD (degenerative disc disease)     lumbar and cervical.   . Osteoarthritis     "severe; progressing fast" (07/12/2013); multiple joints-not surgical candidate for TKR (03/2015)  . Chronic lower back pain   . Anxiety     panic attacks  . Nephrolithiasis     "passed all on my own or they are still in there" (07/12/2013)  . Pyelonephritis      "several times over the last 30 yr" (07/12/2013)  . Diastolic congestive heart failure ([place])   . COPD (chronic obstructive pulmonary disease) ([place])   . Pulmonary embolism ([place]) 07/2013    Dx at [facility] with very small peripheral upper lobe pe 07/2013: pt took coumadin for about 8-9 mo  . Pleural plaque with presence of asbestos 07/22/2013  . BPPV (benign paroxysmal positional vertigo) 12/16/2012  . RBBB (right bundle branch block)   . Acute upper GI bleed 06/2014    while pt taking coumadin, plavix, and meloxicam---despite being told not to take coumadin.  . Iron deficiency anemia     Hematologist in [place], [place] did extensive w/u; no cause found; failed oral supplement;; gets fairly regular (q35m or so) IV iron infusions (Venofer -iron sucrose- 200mg  with procrit.  "for 14 yr I've been getting blood work q month & getting infusions prn" (07/12/2013).  Dr. Marin Olp locally, iron infusions done, EPO deficiency dx'd  . Pernicious anemia 08/24/2014    Past Surgical History  Procedure Laterality Date  . Appendectomy  1960  . Total abdominal hysterectomy  1974  . Tendon release  1996    Right forearm and hand  . Knee surgery  2005  . Heel spur surgery Left 2008  . Plantar fascia release Left 2008  . Axillary surgery Left 1978    Multiple "lump" in armpit per pt  . Coccyx removal  1972  . Cardiac catheterization  01/2013    nonobstructive CAD, EF 55-60%  . Transthoracic echocardiogram  01/2013; 04/2014;08/2015    2014--NORMAL.  2015--focal basal septal hypertrophy, EF 55-60%, grade I diast dysfxn, mild LAE.  08/2015 EF 55-60%, nl LV syst fxn, grade I DD, valves wnl  . Dilation and curettage of uterus  ? 1970's  . Eye surgery Left 2012-2013    "injections for ~ 1 yr; don't really know what for" (07/12/2013)  . Spirometry  04/25/14    In hosp for acute asthma/COPD flare: mixed obstructive and restrictive lung disease. The FEV1 is severely reduced at 45% predicted.  FEV1 signif decreased compared to prior  spirometry 07/23/13.  [name] Kitchen Esophagogastroduodenoscopy N/A 07/19/2014    Gastritis found + in the setting of supratherapeutic INR, +plavix, + meloxicam.  . Left heart catheterization with coronary angiogram N/A 01/30/2013    Procedure: LEFT HEART CATHETERIZATION WITH CORONARY ANGIOGRAM;  Surgeon: Clent Demark, MD;  Location: [facility] CATH LAB;  Service: Cardiovascular;  Laterality: N/A;  . Cardiovascular stress test  02/22/15    Low risk myocard perf imaging; wall motion normal, normal EF     Medications: Current Facility-Administered Medications  Medication Dose Route Frequency Provider Last Rate Last Dose  . 0.9 %  sodium chloride infusion  250 mL Intravenous PRN Almyra Deforest, PA      . acetaminophen (TYLENOL) tablet 650 mg  650 mg Oral Q4H PRN Isaac Laud  Meng, PA      . furosemide (LASIX) injection 40 mg  40 mg Intravenous BID Almyra Deforest, [place]      . heparin injection 5,000 Units  5,000 Units Subcutaneous Q8H Almyra Deforest, [place]      . metoprolol tartrate (LOPRESSOR) tablet 12.5 mg  12.5 mg Oral BID Almyra Deforest, PA      . ondansetron [facility]) injection 4 mg  4 mg Intravenous Q6H PRN Almyra Deforest, PA      . sodium chloride flush (NS) 0.9 % injection 3 mL  3 mL Intravenous Q12H Almyra Deforest, PA   3 mL at 05/30/16 1245  . sodium chloride flush (NS) 0.9 % injection 3 mL  3 mL Intravenous PRN Almyra Deforest, PA       Facility-Administered Medications Ordered in Other Encounters  Medication Dose Route Frequency Provider Last Rate Last Dose  . ferumoxytol (FERAHEME) 510 mg in sodium chloride 0.9 % 100 mL IVPB  510 mg Intravenous Once Volanda Napoleon, MD        Allergies: Allergies  Allergen Reactions  . Penicillins Itching, Swelling and Rash    Tolerated Cefepime in ED. Has patient had a PCN reaction causing immediate rash, facial/tongue/throat swelling, SOB or lightheadedness with hypotension: Yes Has patient had a PCN reaction causing severe rash involving mucus membranes or skin necrosis: No Has patient had a PCN reaction that required  hospitalization: No  Has patient had a PCN reaction occurring within the last 10 years: No     Social History: The patient  reports that she has never smoked. She has never used smokeless tobacco. She reports that she does not drink alcohol or use illicit drugs.   Family History: The patient's family history includes Arthritis in her mother; Diabetes in her father; Heart attack in her father and paternal grandmother; Heart disease in her father; Hypertension in her father; Kidney disease in her mother; Stroke in her father.   Review of Systems: Please see the history of present illness.   Otherwise, the review of systems is positive for SOB, DOE, PND.   All other systems are reviewed and negative.   Physical Exam: VS:  BP 137/67 mmHg  Pulse 62  Temp(Src) 98.2 F (36.8 C) (Oral)  Resp 18  Ht 5\' 4"  (1.626 m)  Wt 176 lb 12.8 oz (80.196 kg)  BMI 30.33 kg/m2  SpO2 96% .  BMI Body mass index is 30.33 kg/(m^2).  Wt Readings from Last 3 Encounters:  05/30/16 176 lb 12.8 oz (80.196 kg)  05/30/16 177 lb 9.6 oz (80.559 kg)  05/14/16 172 lb 9.6 oz (78.291 kg)    General: Pleasant. Well developed, well nourished and in no acute distress.  HEENT: Normal. Neck: Supple, no JVD, carotid bruits, or masses noted.  Cardiac: Regular rate and rhythm. No murmurs, rubs, or gallops. +2-3 edema.  Respiratory:  Bibasilar rale with diminished breath sound, R > L GI: Soft and nontender.  MS: No deformity or atrophy. Gait and ROM intact. Skin: Warm and dry. Color is normal.  Neuro:  Strength and sensation are intact and no gross focal deficits noted.  Psych: Alert, appropriate and with normal affect.   LABORATORY DATA:  EKG:  EKG is ordered today. This demonstrates chronic RBBB.  Lab Results  Component Value Date   WBC 6.1 04/08/2016   HGB 13.9 04/08/2016   HCT 42.5 04/08/2016   PLT 134* 04/08/2016   GLUCOSE 122* 04/11/2016   CHOL 152 01/31/2013   TRIG 92  01/31/2013   HDL 44 01/31/2013    LDLCALC 90 01/31/2013   ALT 13* 04/08/2016   AST 18 04/08/2016   NA 141 04/11/2016   K 4.1 04/11/2016   CL 100* 04/11/2016   CREATININE 0.85 04/11/2016   BUN 14 04/11/2016   CO2 29 04/11/2016   TSH 1.002 04/08/2016   INR 1.05 04/08/2016   HGBA1C 5.6 01/02/2016    BNP (last 3 results)  Recent Labs  10/16/15 1500 10/27/15 1602 04/08/16 1800  BNP 15.4 19.9 30.2    ProBNP (last 3 results)  Recent Labs  08/24/15 1105  PROBNP 92.0     Other Studies Reviewed Today:  Myoview 2015-03-17 IMPRESSION: 1. No reversible ischemia or infarction.  2. Normal left ventricular wall motion.  3. Left ventricular ejection fraction 96%. Ejection fractions are typical overestimated with this technique and small cardiac volumes.  4. Low-risk stress test findings*.    Echo 08/24/2015 LV EF: 60% - 65%  ------------------------------------------------------------------- Indications: Chest Pain (R07.9).  ------------------------------------------------------------------- History: PMH: Right Bundle Branch Block, Asthma, Obstructive Sleep Apnea with CPAP. Syncope and dyspnea. Congestive heart failure. Chronic obstructive pulmonary disease. Risk factors: Family history of coronary artery disease. Hypertension.  ------------------------------------------------------------------- Study Conclusions  - Left ventricle: The cavity size was normal. Systolic function was  normal. The estimated ejection fraction was in the range of 60%  to 65%. Wall motion was normal; there were no regional wall  motion abnormalities. There was an increased relative  contribution of atrial contraction to ventricular filling.  Doppler parameters are consistent with abnormal left ventricular  relaxation (grade 1 diastolic dysfunction). - Tricuspid valve: There was trivial regurgitation.   Cath 01/2013     Assessment/Plan:  1. Acute on chronic respiratory failure: likely  multifactorial with underlying COPD/abestosis and acute HF  - see #2  2. Acute on chronic diastolic HF  - her weight is 9 lbs > than her discharge weight in Apr, and 4 lbs higher since last office visit. She has been compliant with Na restriction, bu she does drink 4 bottles of water  - she has been having weight gain, LE edema, orthopnea, PND and cough for several days  - she lives alone, and has not noticed significant improvement after an increased dose of lasix. Fortunately her O2 sat on 3L oxygen is 96%   - I discussed with DOD Dr. Tamala Julian, she does appears to be in acute HF and appears very dyspneic. After discussing various option with her regarding close outpatient obs in 3 days vs direct admit, she is agreeable for direct admission for IV diuresis which I think is reasonable given the degree of swelling and dyspenia  - CXR, BNP, CMP, CBC, CXR, start IV lasix 40mg  BID  3. Chest pressure:   - previous cath 01/2013 showed EF 60-65%, 4+ MR, patent left main, 15-20% mid LAD stenosis, 20-30% ostial D2 stenosis, 40-50% OM1 stenosis., 15-20% OM 2 stenosis, 50-60% mid RCA stenosis  - unclear if related to HF, she has a negative myoview on Mar 17, 2015   4. COPD  5. Label HTN  - per patient, her BP usually run in 180s at home before her meds and back to normal after medication.  - will change bystolic to metoprolol BID dosing to make her BP more even    Current medicines are reviewed with the patient today.  The patient does not have concerns regarding medicines other than what has been noted above.  The following changes have been made:  See above.  Labs/ tests ordered today include:    Orders Placed This Encounter  Procedures  . DG Chest 2 View  . Basic metabolic panel  . Brain natriuretic peptide  . CBC WITH DIFFERENTIAL  . Comprehensive metabolic panel  . Protime-INR  . Troponin I  . CBC  . Diet Heart Room service appropriate?: Yes; Fluid consistency:: Thin; Fluid restriction::  2000 mL Fluid  . RN may order Cardiology PRN Orders (through additional orders) for the following patient needs; indigestion (Maalox)), cough (Robitussin DM), constipation (MOM), diarrhea (Imodium), hemorrhoids (Tucks), or minor skin irritation (Hydrocortisone Cream)  . Activity per Heart Failure Clinical Pathway  . Cardiac monitoring  . Notify physician   . Vital signs  . No ACE Inhibitor / ARB  . Daily weights  . If diabetic or glucose greater than 140mg /dl, notify physician for Gylcemic Control (SSI) Order Set  . In and Out Cath  . Initiate Heart Failure clinical pathway  . Maintain IV access or saline lock  . Patient education (specify):  . Strict intake and output  . Weigh patient on admission  . Full code  . Consult to case management  . Oxygen therapy Mode or (Route): Nasal cannula; Keep 02 saturation: greater than 92 %  . Admit to Inpatient (patient's expected length of stay will be greater than 2 midnights or inpatient only procedure)     Disposition:   FU with Dr. Stanford Breed in 3 months.   Patient is agreeable to this plan and will call if any problems develop in the interim.   Signed: Almyra Deforest PA-C 05/30/2016 12:57 PM  [facility] [address] [place], [place]  [address] Phone: [phone] The patient has been seen in conjunction with Almyra Deforest, PAC. All aspects of care have been considered and discussed. The patient has been personally interviewed, examined, and all clinical data has been reviewed.   Severe chronic O2 dependent respiratory failure with increasing dyspnea and volume overload. Unable to lie down due to dyspnea.  Admit to improve respiratory status via diuresis and perhaps pulmonary bronchodilator therapy. Overall, this may be more Cor Pulmonale than LV diastolic failure.  Overall poor prognosis.

## 2016-05-30 NOTE — Patient Instructions (Signed)
Medication Instructions:  None  Labwork: None  Testing/Procedures: None  Follow-Up: Will be determined at hospital  Any Other Special Instructions Will Be Listed Below (If Applicable).  You are being admitted to [facility].   If you need a refill on your cardiac medications before your next appointment, please call your pharmacy.

## 2016-05-31 ENCOUNTER — Ambulatory Visit: Payer: Self-pay | Admitting: *Deleted

## 2016-05-31 ENCOUNTER — Inpatient Hospital Stay (HOSPITAL_COMMUNITY): Payer: Medicare Other

## 2016-05-31 DIAGNOSIS — R600 Localized edema: Secondary | ICD-10-CM

## 2016-05-31 LAB — BASIC METABOLIC PANEL
ANION GAP: 6 (ref 5–15)
BUN: 9 mg/dL (ref 6–20)
CALCIUM: 8.6 mg/dL — AB (ref 8.9–10.3)
CO2: 34 mmol/L — AB (ref 22–32)
CREATININE: 1.04 mg/dL — AB (ref 0.44–1.00)
Chloride: 102 mmol/L (ref 101–111)
GFR calc Af Amer: >60 mL/min (ref >60)
GFR calc non Af Amer: 53 mL/min — ABNORMAL LOW (ref >60)
GLUCOSE: 104 mg/dL — AB (ref 65–99)
Potassium: 3.4 mmol/L — ABNORMAL LOW (ref 3.5–5.1)
Sodium: 142 mmol/L (ref 135–145)

## 2016-05-31 LAB — D-DIMER, QUANTITATIVE: D-Dimer, Quant: 2.19 ug/mL-FEU — ABNORMAL HIGH (ref 0.00–0.50)

## 2016-05-31 LAB — TROPONIN I: Troponin I: <0.03 ng/mL (ref <0.031)

## 2016-05-31 MED ORDER — TRAZODONE HCL 150 MG PO TABS
150.0000 mg | ORAL_TABLET | Freq: Every day | ORAL | Status: DC
  Administered 2016-05-31 – 2016-06-05 (×6): 150 mg via ORAL
  Filled 2016-05-31 (×6): qty 1

## 2016-05-31 NOTE — Progress Notes (Signed)
1510 report received from [place] ,[place]. Pt asleep .Respiration easy and regular . Cardiac monitor on and showing NSR

## 2016-05-31 NOTE — Consult Note (Signed)
   [facility] CM Inpatient Consult   05/31/2016  Linda [name] [birth year] [id number]  Patient is currently active [up to admission]  with [facility] for chronic disease management services.  Patient has been engaged by a [employer].Patient  is a 70 y.o. female who presents with HX of  nonobstructive CAD in 01/2013, severe MR, history of presumed orthostatic syncope, HTN.  Our community based plan of care has focused on disease management and community resource support. Met with patient at the bedside.  Patient consents to ongoing post hospital follow up.  She states she is active with [place] as well.  States she did not sleep well last night and felt like she didn't get her usual dose of her Trazadone. States she forgot to mention this to the MD this morning.   Reported this to her nurse for follow up. Active consent on file.   Patient will receive a post discharge transition of care call and will be evaluated for monthly home visits for assessments and disease process education.  Made Inpatient Case Manager aware that [facility] following. Of note, [facility] Care Management services does not replace or interfere with any services that are needed or arranged by inpatient case management or social work.  For additional questions or referrals please contact:  Natividad Brood, RN BSN [facility] Liaison  [phone] business mobile phone Toll free office [phone]

## 2016-05-31 NOTE — Progress Notes (Signed)
SUBJECTIVE:  Feels better but still SHOB  OBJECTIVE:   Vitals:   Filed Vitals:   05/31/16 0106 05/31/16 0529 05/31/16 0904 05/31/16 1013  BP: 108/55 108/50 122/56   Pulse: 62 64 78   Temp: 97.9 F (36.6 C) 98.1 F (36.7 C) 98.2 F (36.8 C)   TempSrc: Oral Oral Oral   Resp: 17 18 18    Height:      Weight:  176 lb 8 oz (80.06 kg)    SpO2: 100% 98% 90% 96%   I&O's:   Intake/Output Summary (Last 24 hours) at 05/31/16 1130 Last data filed at 05/31/16 1040  Gross per 24 hour  Intake   1060 ml  Output   1350 ml  Net   -290 ml   TELEMETRY: Reviewed telemetry pt in NSR:     PHYSICAL EXAM General: Well developed, well nourished, in no acute distress Head:   Normal cephalic and atramatic  Lungs:   Clear bilaterally to auscultation. Heart:   HRRR S1 S2  No JVD.   Abdomen: abdomen soft and non-tender Msk:  Back normal,  Normal strength and tone for age. Extremities:  Bilateral edema.   Neuro: Alert and oriented. Psych:  Flat affect, responds appropriately Skin: No rash   LABS: Basic Metabolic Panel:  Recent Labs  05/30/16 1302 05/31/16 0030  NA 142 142  K 3.7 3.4*  CL 102 102  CO2 33* 34*  GLUCOSE 96 104*  BUN 9 9  CREATININE 0.85 1.04*  CALCIUM 9.1 8.6*   Liver Function Tests:  Recent Labs  05/30/16 1302  AST 19  ALT 15  ALKPHOS 75  BILITOT 0.7  PROT 6.5  ALBUMIN 3.5   No results for input(s): LIPASE, AMYLASE in the last 72 hours. CBC:  Recent Labs  05/30/16 1302  WBC 5.9  NEUTROABS 3.5  HGB 11.8*  HCT 37.2  MCV 89.9  PLT 171   Cardiac Enzymes:  Recent Labs  05/30/16 1302 05/30/16 1815 05/31/16 0030  TROPONINI <0.03 <0.03 <0.03   BNP: Invalid input(s): POCBNP D-Dimer:  Recent Labs  05/31/16 0030  DDIMER 2.19*   Hemoglobin A1C: No results for input(s): HGBA1C in the last 72 hours. Fasting Lipid Panel: No results for input(s): CHOL, HDL, LDLCALC, TRIG, CHOLHDL, LDLDIRECT in the last 72 hours. Thyroid Function Tests: No  results for input(s): TSH, T4TOTAL, T3FREE, THYROIDAB in the last 72 hours.  Invalid input(s): FREET3 Anemia Panel: No results for input(s): VITAMINB12, FOLATE, FERRITIN, TIBC, IRON, RETICCTPCT in the last 72 hours. Coag Panel:   Lab Results  Component Value Date   INR 1.07 05/30/2016   INR 1.05 04/08/2016   INR 1.14 01/02/2016   PROTIME 12.0 10/24/2014   PROTIME 12.0 08/24/2014   PROTIME 33.6* 07/08/2014    RADIOLOGY: Dg Chest 2 View  05/31/2016  CLINICAL DATA:  Shortness of breath and swelling. EXAM: CHEST  2 VIEW COMPARISON:  04/09/2016 FINDINGS: Mild cardiac enlargement. There is aortic atherosclerosis noted. No pleural effusion or edema. No airspace consolidation. Calcified pleural plaques are identified bilaterally. IMPRESSION: 1. No acute findings. 2. Cardiac enlargement and aortic atherosclerosis 3. Calcified pleural plaques noted compatible with prior asbestos exposure. Electronically Signed   By: Kerby Moors M.D.   On: 05/31/2016 09:35      ASSESSMENT: Kathyrn Lass:    1) Acute on chronic diastolic HF - at admission, her weight was 9 lbs > than her discharge weight in Apr, and 4 lbs higher since last office visit. She has been  compliant with Na restriction - she has been having weight gain, LE edema, orthopnea, PND and cough for several days  Continue diuresis with IV Lasix.  Leg swelling.  She is very concerned about a LE DVT.  I think exam is not consistnet but she was asking about her D-dimer and was quite fixated on teh notion of a DVT.  She reports the left leg swells more, but both are swollen.  Will order LE venous Duplex.  2) Chronic lung disease.  Jettie Booze, MD  05/31/2016  11:30 AM

## 2016-05-31 NOTE — Progress Notes (Signed)
Pt takes 150mg  of trazadone at hs. Called on call MD, Dr. Percival Spanish made him aware. Orders given.

## 2016-06-01 ENCOUNTER — Encounter (HOSPITAL_COMMUNITY): Payer: Medicare Other

## 2016-06-01 LAB — BASIC METABOLIC PANEL
ANION GAP: 7 (ref 5–15)
BUN: 12 mg/dL (ref 6–20)
CALCIUM: 9.1 mg/dL (ref 8.9–10.3)
CO2: 33 mmol/L — AB (ref 22–32)
Chloride: 100 mmol/L — ABNORMAL LOW (ref 101–111)
Creatinine, Ser: 0.82 mg/dL (ref 0.44–1.00)
GFR calc Af Amer: >60 mL/min (ref >60)
GFR calc non Af Amer: >60 mL/min (ref >60)
GLUCOSE: 118 mg/dL — AB (ref 65–99)
POTASSIUM: 3.9 mmol/L (ref 3.5–5.1)
Sodium: 140 mmol/L (ref 135–145)

## 2016-06-01 LAB — URINE MICROSCOPIC-ADD ON: RBC / HPF: NONE SEEN RBC/hpf (ref 0–5)

## 2016-06-01 LAB — URINALYSIS, ROUTINE W REFLEX MICROSCOPIC
BILIRUBIN URINE: NEGATIVE
GLUCOSE, UA: NEGATIVE mg/dL
HGB URINE DIPSTICK: NEGATIVE
KETONES UR: NEGATIVE mg/dL
Nitrite: NEGATIVE
PROTEIN: NEGATIVE mg/dL
Specific Gravity, Urine: 1.011 (ref 1.005–1.030)
pH: 6.5 (ref 5.0–8.0)

## 2016-06-01 MED ORDER — FUROSEMIDE 10 MG/ML IJ SOLN
60.0000 mg | Freq: Two times a day (BID) | INTRAMUSCULAR | Status: DC
  Administered 2016-06-01 – 2016-06-04 (×6): 60 mg via INTRAVENOUS
  Filled 2016-06-01 (×6): qty 6

## 2016-06-01 MED ORDER — CETYLPYRIDINIUM CHLORIDE 0.05 % MT LIQD
7.0000 mL | Freq: Two times a day (BID) | OROMUCOSAL | Status: DC
  Administered 2016-06-01 – 2016-06-06 (×9): 7 mL via OROMUCOSAL

## 2016-06-01 NOTE — Progress Notes (Signed)
Primary cardiologist: Dr. [name]  Seen for followup: Acute on chronic diastolic heart failure  Subjective:    No complains of shortness of breath. No chest pain or palpitations. Chronic trouble with insomnia.  Objective:   Temp:  [97.5 F (36.4 C)-98.3 F (36.8 C)] 97.7 F (36.5 C) (06/10 0424) Pulse Rate:  [61-96] 96 (06/10 0800) Resp:  [16-18] 16 (06/10 0800) BP: (91-113)/(42-59) 113/57 mmHg (06/10 0800) SpO2:  [96 %-99 %] 99 % (06/10 0800) Weight:  [175 lb 12.8 oz (79.742 kg)] 175 lb 12.8 oz (79.742 kg) (06/10 0424) Last BM Date: 05/29/16  Filed Weights   05/30/16 1245 05/31/16 0529 06/01/16 0424  Weight: 176 lb 12.8 oz (80.196 kg) 176 lb 8 oz (80.06 kg) 175 lb 12.8 oz (79.742 kg)    Intake/Output Summary (Last 24 hours) at 06/01/16 1208 Last data filed at 06/01/16 0938  Gross per 24 hour  Intake    684 ml  Output    750 ml  Net    -66 ml    Telemetry: Sinus rhythm.  Exam:  General: Overweight woman in no distress.  Lungs: No crackles.  Cardiac: RRR with soft apical systolic murmur.  Abdomen: NABS.  Extremities: 1-2+ lower leg edema.  Lab Results:  Basic Metabolic Panel:  Recent Labs Lab 05/30/16 1302 05/31/16 0030 06/01/16 0406  NA 142 142 140  K 3.7 3.4* 3.9  CL 102 102 100*  CO2 33* 34* 33*  GLUCOSE 96 104* 118*  BUN 9 9 12   CREATININE 0.85 1.04* 0.82  CALCIUM 9.1 8.6* 9.1    Liver Function Tests:  Recent Labs Lab 05/30/16 1302  AST 19  ALT 15  ALKPHOS 75  BILITOT 0.7  PROT 6.5  ALBUMIN 3.5    CBC:  Recent Labs Lab 05/30/16 1302  WBC 5.9  HGB 11.8*  HCT 37.2  MCV 89.9  PLT 171    Cardiac Enzymes:  Recent Labs Lab 05/30/16 1302 05/30/16 1815 05/31/16 0030  TROPONINI <0.03 <0.03 <0.03    BNP:  Recent Labs  08/24/15 1105  PROBNP 92.0    Coagulation:  Recent Labs Lab 05/30/16 1302  INR 1.07   Echocardiogram 08/24/2015: Study Conclusions  - Left ventricle: The cavity size was normal.  Systolic function was  normal. The estimated ejection fraction was in the range of 60%  to 65%. Wall motion was normal; there were no regional wall  motion abnormalities. There was an increased relative  contribution of atrial contraction to ventricular filling.  Doppler parameters are consistent with abnormal left ventricular  relaxation (grade 1 diastolic dysfunction). - Tricuspid valve: There was trivial regurgitation.   Medications:   Scheduled Medications: . antiseptic oral rinse  7 mL Mouth Rinse BID  . ARIPiprazole  10 mg Oral Daily  . aspirin EC  81 mg Oral QHS  . atorvastatin  20 mg Oral QHS  . DULoxetine  90 mg Oral QHS  . folic acid  2 mg Oral Daily  . furosemide  40 mg Intravenous BID  . heparin  5,000 Units Subcutaneous Q8H  . isosorbide mononitrate  30 mg Oral Daily  . metoprolol tartrate  12.5 mg Oral BID  . mometasone-formoterol  2 puff Inhalation BID  . multivitamin with minerals  1 tablet Oral Daily  . pantoprazole  40 mg Oral Daily  . potassium chloride SA  40 mEq Oral BID  . pregabalin  300 mg Oral BID  . sodium chloride flush  3 mL Intravenous Q12H  .  traZODone  150 mg Oral QHS  . [START ON 06/03/2016] Vitamin D (Ergocalciferol)  50,000 Units Oral Q Mon     PRN Medications:  sodium chloride, acetaminophen, albuterol, ALPRAZolam, ibuprofen, nitroGLYCERIN, ondansetron (ZOFRAN) IV, oxyCODONE-acetaminophen **AND** oxyCODONE, polyethylene glycol, sodium chloride flush   Assessment:   1. Acute on chronic diastolic heart failure, last LVEF 60-65% with grade 1 diastolic dysfunction as of September 2016.  2. History of mitral regurgitation, previously severe although not reported as significant by last echocardiogram in September 2016.  3. Mild to moderate nonobstructive CAD in multivessel distribution by last cardiac catheterization in 2014.  4. COPD.  5. OSA on CPAP.  6. Insomnia. Patient states that she takes trazodone 150 mg in the evening. She  asked me to check on this in the chart, and I see that an order is already written.   Plan/Discussion:    Place Foley catheter and increase Lasix dose. Follow-up echocardiogram will be obtained to ensure stability in LVEF and also reassess mitral regurgitation. Continue aspirin, Lipitor, Imdur, Lopressor and potassium supplements. Follow-up BMET and CBC in a.m.   Satira Sark, M.D., F.A.C.C.

## 2016-06-02 ENCOUNTER — Inpatient Hospital Stay (HOSPITAL_COMMUNITY): Payer: Medicare Other

## 2016-06-02 DIAGNOSIS — R0600 Dyspnea, unspecified: Secondary | ICD-10-CM

## 2016-06-02 DIAGNOSIS — R609 Edema, unspecified: Secondary | ICD-10-CM

## 2016-06-02 LAB — CBC
HEMATOCRIT: 40.1 % (ref 36.0–46.0)
HEMOGLOBIN: 12.4 g/dL (ref 12.0–15.0)
MCH: 28.8 pg (ref 26.0–34.0)
MCHC: 30.9 g/dL (ref 30.0–36.0)
MCV: 93 fL (ref 78.0–100.0)
Platelets: 194 10*3/uL (ref 150–400)
RBC: 4.31 MIL/uL (ref 3.87–5.11)
RDW: 13.3 % (ref 11.5–15.5)
WBC: 7.1 10*3/uL (ref 4.0–10.5)

## 2016-06-02 LAB — BASIC METABOLIC PANEL
ANION GAP: 7 (ref 5–15)
BUN: 13 mg/dL (ref 6–20)
CHLORIDE: 97 mmol/L — AB (ref 101–111)
CO2: 36 mmol/L — AB (ref 22–32)
Calcium: 9.6 mg/dL (ref 8.9–10.3)
Creatinine, Ser: 0.94 mg/dL (ref 0.44–1.00)
GFR calc Af Amer: >60 mL/min (ref >60)
GFR calc non Af Amer: >60 mL/min (ref >60)
GLUCOSE: 109 mg/dL — AB (ref 65–99)
POTASSIUM: 4.1 mmol/L (ref 3.5–5.1)
Sodium: 140 mmol/L (ref 135–145)

## 2016-06-02 NOTE — Progress Notes (Signed)
Patient ID: Linda [name], female   DOB: 1946/08/25, 70 y.o.   MRN: [id number]    Primary cardiologist: Dr. [name]  Seen for followup: Acute on chronic diastolic heart failure  Subjective:    Breathing better sitting in chair with legs up Had LE venous duplex Still no echo  Objective:   Temp:  [97.7 F (36.5 C)-97.8 F (36.6 C)] 97.8 F (36.6 C) (06/11 0515) Pulse Rate:  [62-79] 78 (06/11 0925) Resp:  [18-20] 18 (06/11 0515) BP: (93-135)/(48-89) 93/48 mmHg (06/11 0925) SpO2:  [94 %-97 %] 97 % (06/11 0515) Weight:  [78.971 kg (174 lb 1.6 oz)] 78.971 kg (174 lb 1.6 oz) (06/11 0515) Last BM Date: 06/01/16  Filed Weights   05/31/16 0529 06/01/16 0424 06/02/16 0515  Weight: 80.06 kg (176 lb 8 oz) 79.742 kg (175 lb 12.8 oz) 78.971 kg (174 lb 1.6 oz)    Intake/Output Summary (Last 24 hours) at 06/02/16 1116 Last data filed at 06/02/16 0600  Gross per 24 hour  Intake   2600 ml  Output   2250 ml  Net    350 ml    Telemetry: Sinus rhythm. 06/02/2016   Exam:  Affect appropriate Obese white female  HEENT: normal Neck supple with no adenopathy JVP normal no bruits no thyromegaly Lungs clear with no wheezing and good diaphragmatic motion Heart:  S1/S2 MR  murmur, no rub, gallop or click PMI normal Abdomen: benighn, BS positve, no tenderness, no AAA no bruit.  No HSM or HJR Distal pulses intact with no bruits Plus 2 bilateral LE  edema Neuro non-focal Skin warm and dry No muscular weakness Foley in place    Lab Results:  Basic Metabolic Panel:  Recent Labs Lab 05/31/16 0030 06/01/16 0406 06/02/16 0300  NA 142 140 140  K 3.4* 3.9 4.1  CL 102 100* 97*  CO2 34* 33* 36*  GLUCOSE 104* 118* 109*  BUN 9 12 13   CREATININE 1.04* 0.82 0.94  CALCIUM 8.6* 9.1 9.6    Liver Function Tests:  Recent Labs Lab 05/30/16 1302  AST 19  ALT 15  ALKPHOS 75  BILITOT 0.7  PROT 6.5  ALBUMIN 3.5    CBC:  Recent Labs Lab 05/30/16 1302 06/02/16 0300    WBC 5.9 7.1  HGB 11.8* 12.4  HCT 37.2 40.1  MCV 89.9 93.0  PLT 171 194    Cardiac Enzymes:  Recent Labs Lab 05/30/16 1302 05/30/16 1815 05/31/16 0030  TROPONINI <0.03 <0.03 <0.03    BNP:  Recent Labs  08/24/15 1105  PROBNP 92.0    Coagulation:  Recent Labs Lab 05/30/16 1302  INR 1.07   Echocardiogram 08/24/2015: Study Conclusions  - Left ventricle: The cavity size was normal. Systolic function was  normal. The estimated ejection fraction was in the range of 60%  to 65%. Wall motion was normal; there were no regional wall  motion abnormalities. There was an increased relative  contribution of atrial contraction to ventricular filling.  Doppler parameters are consistent with abnormal left ventricular  relaxation (grade 1 diastolic dysfunction). - Tricuspid valve: There was trivial regurgitation.   Medications:   Scheduled Medications: . antiseptic oral rinse  7 mL Mouth Rinse BID  . ARIPiprazole  10 mg Oral Daily  . aspirin EC  81 mg Oral QHS  . atorvastatin  20 mg Oral QHS  . DULoxetine  90 mg Oral QHS  . folic acid  2 mg Oral Daily  . furosemide  60 mg Intravenous BID  .  heparin  5,000 Units Subcutaneous Q8H  . isosorbide mononitrate  30 mg Oral Daily  . metoprolol tartrate  12.5 mg Oral BID  . mometasone-formoterol  2 puff Inhalation BID  . multivitamin with minerals  1 tablet Oral Daily  . pantoprazole  40 mg Oral Daily  . potassium chloride SA  40 mEq Oral BID  . pregabalin  300 mg Oral BID  . sodium chloride flush  3 mL Intravenous Q12H  . traZODone  150 mg Oral QHS  . [START ON 06/03/2016] Vitamin D (Ergocalciferol)  50,000 Units Oral Q Mon    PRN Medications: sodium chloride, acetaminophen, albuterol, ALPRAZolam, ibuprofen, nitroGLYCERIN, ondansetron (ZOFRAN) IV, oxyCODONE-acetaminophen **AND** oxyCODONE, polyethylene glycol, sodium chloride flush   Assessment:   1. Acute on chronic diastolic heart failure, last LVEF 60-65% with  grade 1 diastolic dysfunction as of September 2016.  2. History of mitral regurgitation, previously severe although not reported as significant by last echocardiogram in September 2016.  3. Mild to moderate nonobstructive CAD in multivessel distribution by last cardiac catheterization in 2014.  4. COPD.  5. OSA on CPAP.   Plan/Discussion:    Not clear to me high volume overloaded she is LE duplex and echo results pending Lasix increased Yesterday Cr/K ok this am BNP only [address]

## 2016-06-02 NOTE — Progress Notes (Signed)
VASCULAR LAB PRELIMINARY  PRELIMINARY  PRELIMINARY  PRELIMINARY  Bilateral lower extremity venous duplex completed.    Preliminary report:  There is no DVT or SVT noted in the bilateral lower extremities.   Chanice Brenton, RVT 06/02/2016, 9:59 AM

## 2016-06-02 NOTE — Progress Notes (Signed)
*  PRELIMINARY RESULTS* Echocardiogram 2D Echocardiogram has been performed.  Linda [name] 06/02/2016, 2:30 PM

## 2016-06-03 LAB — BASIC METABOLIC PANEL
Anion gap: 11 (ref 5–15)
BUN: 23 mg/dL — AB (ref 6–20)
CHLORIDE: 93 mmol/L — AB (ref 101–111)
CO2: 32 mmol/L (ref 22–32)
CREATININE: 1.18 mg/dL — AB (ref 0.44–1.00)
Calcium: 9.9 mg/dL (ref 8.9–10.3)
GFR calc Af Amer: 53 mL/min — ABNORMAL LOW (ref >60)
GFR calc non Af Amer: 46 mL/min — ABNORMAL LOW (ref >60)
GLUCOSE: 130 mg/dL — AB (ref 65–99)
POTASSIUM: 4.8 mmol/L (ref 3.5–5.1)
Sodium: 136 mmol/L (ref 135–145)

## 2016-06-03 LAB — ECHOCARDIOGRAM COMPLETE
Height: 64 in
Weight: 2785.6 oz

## 2016-06-03 MED ORDER — CIPROFLOXACIN HCL 500 MG PO TABS
500.0000 mg | ORAL_TABLET | Freq: Two times a day (BID) | ORAL | Status: DC
  Administered 2016-06-03 – 2016-06-04 (×3): 500 mg via ORAL
  Filled 2016-06-03 (×3): qty 1

## 2016-06-03 NOTE — Care Management Important Message (Signed)
Important Message  Patient Details  Name: Linda [name] MRN: [id number] Date of Birth: 01-26-1946   Medicare Important Message Given:  Yes    Loann Quill 06/03/2016, 1:47 PM

## 2016-06-03 NOTE — Progress Notes (Signed)
Patient Name: Linda [name] Date of Encounter: 06/03/2016  Principal Problem:   Acute on chronic diastolic heart failure (HCC) Active Problems:   HTN (hypertension), benign   Chronic respiratory failure ([place])   UTI (urinary tract infection)   Primary Cardiologist: Dr Stanford Breed  Patient Profile: 70 yo female w/ hx severe MR, history of presumed orthostatic syncope, HTN, COPD. Cardiac cath 01/2013 w/ EF 60-65%, 4+ MR, patent left main, 15-20% mid LAD, 20-30% ostial D2, 40-50% OM1, 15-20% OM 2, 50-60% mid RCA. Interestingly, echo 2016 w/ MV gradient 4, EF [id number], grade 1 diastolic dysfunction. Admitted 06/08 w/ resp failure, CHF, ?cor pulmonale  Per verbal report, > 100,000 colonies G- rods, sensitivities available 06/13, echo now ready to be read.  SUBJECTIVE: Says she is breathing better today, still not at baseline. Says dry weight upper 160s. C/o being lightheaded. Lives alone but in independent living so has meals available and help w/ housework. On 3 lpm O2 at home.  OBJECTIVE Filed Vitals:   06/02/16 1700 06/02/16 2200 06/03/16 0615 06/03/16 0657  BP: 104/53 122/58 116/59   Pulse: 68 85 88   Temp:  98.8 F (37.1 C) 98.9 F (37.2 C)   TempSrc:  Oral Oral   Resp:  20 18   Height:      Weight:    171 lb 9.6 oz (77.837 kg)  SpO2:  99% 100%     Intake/Output Summary (Last 24 hours) at 06/03/16 1013 Last data filed at 06/03/16 0900  Gross per 24 hour  Intake    700 ml  Output   2875 ml  Net  -2175 ml   Filed Weights   06/01/16 0424 06/02/16 0515 06/03/16 0657  Weight: 175 lb 12.8 oz (79.742 kg) 174 lb 1.6 oz (78.971 kg) 171 lb 9.6 oz (77.837 kg)    PHYSICAL EXAM General: Well developed, well nourished, female in no acute distress. Head: Normocephalic, atraumatic.  Neck: Supple without bruits, JVD 8-9 cm. Lungs:  Resp regular and unlabored, bibasilar rales. Heart: RRR, S1, S2, no S3, S4, soft murmur; no rub. Abdomen: Soft, non-tender, non-distended, BS + x  4.  Extremities: No clubbing, cyanosis, edema.  Neuro: Alert and oriented X 3. Moves all extremities spontaneously. Psych: Normal affect.  LABS: CBC:  Recent Labs  06/02/16 0300  WBC 7.1  HGB 12.4  HCT 40.1  MCV 93.0  PLT [id number]   Basic Metabolic Panel:  Recent Labs  06/01/16 0406 06/02/16 0300  NA 140 140  K 3.9 4.1  CL 100* 97*  CO2 33* 36*  GLUCOSE 118* 109*  BUN 12 13  CREATININE 0.82 0.94  CALCIUM 9.1 9.6   BNP:  B NATRIURETIC PEPTIDE  Date/Time Value Ref Range Status  05/30/2016 01:02 PM 87.9 0.0 - 100.0 pg/mL Final  04/08/2016 06:00 PM 30.2 0.0 - 100.0 pg/mL Final    TELE: SR, no sig ectopy       Radiology/Studies: Dg Chest 2 View 05/31/2016  CLINICAL DATA:  Shortness of breath and swelling. EXAM: CHEST  2 VIEW COMPARISON:  04/09/2016 FINDINGS: Mild cardiac enlargement. There is aortic atherosclerosis noted. No pleural effusion or edema. No airspace consolidation. Calcified pleural plaques are identified bilaterally. IMPRESSION: 1. No acute findings. 2. Cardiac enlargement and aortic atherosclerosis 3. Calcified pleural plaques noted compatible with prior asbestos exposure. Electronically Signed   By: Kerby Moors M.D.   On: 05/31/2016 09:35    Current Medications:  . antiseptic oral rinse  7 mL Mouth  Rinse BID  . ARIPiprazole  10 mg Oral Daily  . aspirin EC  81 mg Oral QHS  . atorvastatin  20 mg Oral QHS  . DULoxetine  90 mg Oral QHS  . folic acid  2 mg Oral Daily  . furosemide  60 mg Intravenous BID  . heparin  5,000 Units Subcutaneous Q8H  . isosorbide mononitrate  30 mg Oral Daily  . metoprolol tartrate  12.5 mg Oral BID  . mometasone-formoterol  2 puff Inhalation BID  . multivitamin with minerals  1 tablet Oral Daily  . pantoprazole  40 mg Oral Daily  . potassium chloride SA  40 mEq Oral BID  . pregabalin  300 mg Oral BID  . sodium chloride flush  3 mL Intravenous Q12H  . traZODone  150 mg Oral QHS  . Vitamin D (Ergocalciferol)  50,000 Units  Oral Q Mon      ASSESSMENT AND PLAN: Principal Problem:   Acute on chronic diastolic heart failure (HCC) - continue IV Lasix today, hopefully change to PO in am - ck orthostatic VS since she is dizzy - echo results pending - follow BMET, recheck today The patient is diuresing. We will continue this approach.  Active Problems:   HTN (hypertension), benign - good control on current rx    Chronic respiratory failure (HCC) - home O2 3 lpm - need assessment of functional status, PT to see    UTI (urinary tract infection) - start Cipro 500 mg bid - sensitivities pending.  Signed, Lenoard Aden 10:13 AM 06/03/2016  Patient seen and examined. I agree with the assessment and plan as detailed above. See also my additional thoughts below.    The patient is diuresing. She appears to be debilitated at this time. We will ask for physical therapy assessment. Dola Argyle, MD, [facility] 06/03/2016 10:54 AM

## 2016-06-04 DIAGNOSIS — I10 Essential (primary) hypertension: Secondary | ICD-10-CM

## 2016-06-04 LAB — URINE CULTURE: Culture: >=100000 — AB

## 2016-06-04 LAB — BASIC METABOLIC PANEL
ANION GAP: 9 (ref 5–15)
BUN: 27 mg/dL — ABNORMAL HIGH (ref 6–20)
CALCIUM: 9.6 mg/dL (ref 8.9–10.3)
CHLORIDE: 93 mmol/L — AB (ref 101–111)
CO2: 35 mmol/L — AB (ref 22–32)
Creatinine, Ser: 1.29 mg/dL — ABNORMAL HIGH (ref 0.44–1.00)
GFR calc non Af Amer: 41 mL/min — ABNORMAL LOW (ref >60)
GFR, EST AFRICAN AMERICAN: 47 mL/min — AB (ref >60)
GLUCOSE: 119 mg/dL — AB (ref 65–99)
POTASSIUM: 5 mmol/L (ref 3.5–5.1)
Sodium: 137 mmol/L (ref 135–145)

## 2016-06-04 MED ORDER — SULFAMETHOXAZOLE-TRIMETHOPRIM 800-160 MG PO TABS
1.0000 | ORAL_TABLET | Freq: Two times a day (BID) | ORAL | Status: AC
  Administered 2016-06-04 – 2016-06-05 (×3): 1 via ORAL
  Filled 2016-06-04 (×3): qty 1

## 2016-06-04 MED ORDER — PREGABALIN 100 MG PO CAPS
100.0000 mg | ORAL_CAPSULE | Freq: Every day | ORAL | Status: DC
  Administered 2016-06-05: 100 mg via ORAL
  Filled 2016-06-04: qty 1

## 2016-06-04 MED ORDER — PREGABALIN 100 MG PO CAPS
300.0000 mg | ORAL_CAPSULE | Freq: Every day | ORAL | Status: DC
  Administered 2016-06-04: 300 mg via ORAL
  Filled 2016-06-04: qty 3

## 2016-06-04 MED ORDER — FUROSEMIDE 40 MG PO TABS
40.0000 mg | ORAL_TABLET | Freq: Two times a day (BID) | ORAL | Status: DC
  Administered 2016-06-04 – 2016-06-06 (×4): 40 mg via ORAL
  Filled 2016-06-04 (×4): qty 1

## 2016-06-04 NOTE — NC FL2 (Addendum)
[place] LEVEL OF CARE SCREENING TOOL     IDENTIFICATION  Patient Name: Linda [name] Birthdate: 08-15-1946 Sex: female Admission Date (Current Location): 05/30/2016  [facility] and [place] Number:  Herbalist and Address:  [facility] [place]. [facility], [place] [address], [place], [place] [address]      Provider Number: [id number]  Attending Physician Name and Address:  Belva Crome, MD  Relative Name and Phone Number:       Current Level of Care: Hospital Recommended Level of Care: [place] Prior Approval Number:    Date Approved/Denied:   PASRR Number: [id number]  Discharge Plan: SNF    Current Diagnoses: Patient Active Problem List   Diagnosis Date Noted  . Acute on chronic diastolic heart failure ([place]) 05/30/2016  . SOB (shortness of breath)   . [name] ([place])   . Rotator cuff syndrome of right shoulder 01/10/2016  . UTI (urinary tract infection) 01/01/2016  . Encephalopathy, metabolic 99991111  . CAP (community acquired pneumonia) 01/01/2016  . Fall at home 01/01/2016  . Rhabdomyolysis 01/01/2016  . Acute on chronic diastolic CHF (congestive heart failure), NYHA class 1 ([place]) 10/17/2015  . Diastolic heart failure ([place]) 10/16/2015  . CAD (coronary artery disease) 08/16/2015  . Chest pain 08/16/2015  . Narrowing of intervertebral disc space 07/17/2015  . Bilateral lower leg pain 01/24/2015  . 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 Chronic pain syndrome 11/21/2014  . Anxiety and depression 11/21/2014  . Abnormal grief reaction 11/21/2014  . Severe persistent asthma 11/21/2014  . Hypokalemia 11/21/2014  . Hyperlipidemia 11/21/2014  . Osteoarthritis of right knee 10/18/2014  . Pernicious anemia 08/24/2014  . Generalized anxiety disorder--with occasional panic attacks.  08/05/2014  . Recurrent major depression-severe ([place]) 08/05/2014  . Multifactorial gait disorder 07/26/2014  . Chronic respiratory  failure ([place]) 07/23/2014  . Epistaxis 07/18/2014  . Acute GI bleeding 07/17/2014  . Anemia associated with acute blood loss 07/17/2014  . Syncope 07/17/2014  . History of pulmonary embolism 07/17/2014  . GI bleed 07/17/2014  . Arthritis 05/11/2014  . DDD (degenerative disc disease) 05/11/2014  . Fibrositis 05/11/2014  . Amianthosis ([place]) 05/11/2014  . Grief 04/28/2014  . OSA (obstructive sleep apnea) 04/24/2014  . Chronic diastolic CHF, NYHA class 1 04/24/2014  . Acute pulmonary embolism ([place]) 08/13/2013  . Pleural plaque with presence of asbestos 07/22/2013  . Polypharmacy 04/26/2013  . Fibromyalgia syndrome 03/01/2013  . Insomnia 11/12/2012  . HTN (hypertension), benign 10/25/2012    Orientation RESPIRATION BLADDER Height & Weight     Self, Time, Situation, Place  O2 (Nasal Canula 2.5 L) Continent Weight: 172 lb 9.6 oz (78.291 kg) Height:  5\' 4"  (162.6 cm)  BEHAVIORAL SYMPTOMS/MOOD NEUROLOGICAL BOWEL NUTRITION STATUS   (None)  (None) Continent Diet (Heart healthy. Fluid restriction 2000 mL.)  AMBULATORY STATUS COMMUNICATION OF NEEDS Skin   Limited Assist Verbally Normal                       Personal Care Assistance Level of Assistance  Bathing, Feeding, Dressing Bathing Assistance: Limited assistance Feeding assistance: Independent Dressing Assistance: Limited assistance     Functional Limitations Info  Sight, Hearing, Speech Sight Info: Impaired Hearing Info: Adequate Speech Info: Adequate    SPECIAL CARE FACTORS FREQUENCY  PT (By licensed PT), Blood pressure     PT Frequency: 5 x week  Contractures Contractures Info: Not present    Additional Factors Info  Code Status, Allergies, Psychotropic Code Status Info: Full Allergies Info: Penicillins Psychotropic Info: Anxiety, Depression: Abilify 10 mg PO daily, Cymbalta DR 90 mg PO QHS, Trazodone 150 mg PO QHS, Xanax 1 mg PO TID prn.         Current Medications (06/04/2016):  This is  the current hospital active medication list Current Facility-Administered Medications  Medication Dose Route Frequency Provider Last Rate Last Dose  . 0.9 %  sodium chloride infusion  250 mL Intravenous PRN Almyra Deforest, PA      . acetaminophen (TYLENOL) tablet 650 mg  650 mg Oral Q4H PRN Almyra Deforest, PA      . albuterol (PROVENTIL) (2.5 MG/3ML) 0.083% nebulizer solution 3 mL  3 mL Inhalation Q6H PRN Almyra Deforest, PA      . ALPRAZolam Duanne Moron) tablet 1 mg  1 mg Oral TID PRN Almyra Deforest, PA   1 mg at 06/04/16 0559  . antiseptic oral rinse (CPC / CETYLPYRIDINIUM CHLORIDE 0.05%) solution 7 mL  7 mL Mouth Rinse BID Belva Crome, MD   7 mL at 06/04/16 1045  . ARIPiprazole (ABILIFY) tablet 10 mg  10 mg Oral Daily Belva Crome, MD   10 mg at 06/04/16 1040  . aspirin EC tablet 81 mg  81 mg Oral QHS Almyra Deforest, PA   81 mg at 06/03/16 2255  . atorvastatin (LIPITOR) tablet 20 mg  20 mg Oral QHS Almyra Deforest, PA   20 mg at 06/03/16 2255  . DULoxetine (CYMBALTA) DR capsule 90 mg  90 mg Oral QHS Almyra Deforest, PA   90 mg at 06/03/16 2255  . folic acid (FOLVITE) tablet 2 mg  2 mg Oral Daily Belva Crome, MD   2 mg at 06/04/16 1040  . furosemide (LASIX) tablet 40 mg  40 mg Oral BID Rhonda G Barrett, PA-C      . heparin injection 5,000 Units  5,000 Units Subcutaneous Q8H Almyra Deforest, PA   5,000 Units at 06/04/16 0531  . ibuprofen (ADVIL,MOTRIN) tablet 800 mg  800 mg Oral Daily PRN Almyra Deforest, PA   800 mg at 06/02/16 0410  . isosorbide mononitrate (IMDUR) 24 hr tablet 30 mg  30 mg Oral Daily Almyra Deforest, [place]   30 mg at 06/04/16 1041  . metoprolol tartrate (LOPRESSOR) tablet 12.5 mg  12.5 mg Oral BID Almyra Deforest, PA   12.5 mg at 06/04/16 1041  . mometasone-formoterol (DULERA) 200-5 MCG/ACT inhaler 2 puff  2 puff Inhalation BID Belva Crome, MD   2 puff at 06/04/16 [phone]  . multivitamin with minerals tablet 1 tablet  1 tablet Oral Daily Belva Crome, MD   1 tablet at 06/04/16 1040  . nitroGLYCERIN (NITROSTAT) SL tablet 0.4 mg  0.4 mg Sublingual Q5  min PRN Almyra Deforest, PA      . ondansetron (ZOFRAN) injection 4 mg  4 mg Intravenous Q6H PRN Almyra Deforest, PA   4 mg at 06/01/16 2124  . oxyCODONE-acetaminophen (PERCOCET/ROXICET) 5-325 MG per tablet 1 tablet  1 tablet Oral Q8H PRN Belva Crome, MD   1 tablet at 06/04/16 0559   And  . oxyCODONE (Oxy IR/ROXICODONE) immediate release tablet 5 mg  5 mg Oral Q8H PRN Belva Crome, MD   5 mg at 06/04/16 0559  . pantoprazole (PROTONIX) EC tablet 40 mg  40 mg Oral Daily Belva Crome, MD   40 mg at  06/04/16 1040  . polyethylene glycol (MIRALAX / GLYCOLAX) packet 17 g  17 g Oral Daily PRN Almyra Deforest, PA   17 g at 06/03/16 1539  . pregabalin (LYRICA) capsule 100 mg  100 mg Oral Daily Belva Crome, MD       And  . pregabalin (LYRICA) capsule 300 mg  300 mg Oral QHS Belva Crome, MD      . sodium chloride flush (NS) 0.9 % injection 3 mL  3 mL Intravenous Q12H Almyra Deforest, PA   3 mL at 06/04/16 1000  . sodium chloride flush (NS) 0.9 % injection 3 mL  3 mL Intravenous PRN Almyra Deforest, PA      . sulfamethoxazole-trimethoprim (BACTRIM DS,SEPTRA DS) 800-160 MG per tablet 1 tablet  1 tablet Oral Q12H Rhonda G Barrett, PA-C      . traZODone (DESYREL) tablet 150 mg  150 mg Oral QHS Minus Breeding, MD   150 mg at 06/03/16 2255  . Vitamin D (Ergocalciferol) (DRISDOL) capsule 50,000 Units  50,000 Units Oral Q Mon Belva Crome, MD   50,000 Units at 06/03/16 1032   Facility-Administered Medications Ordered in Other Encounters  Medication Dose Route Frequency Provider Last Rate Last Dose  . ferumoxytol (FERAHEME) 510 mg in sodium chloride 0.9 % 100 mL IVPB  510 mg Intravenous Once Volanda Napoleon, MD         Discharge Medications: Please see discharge summary for a list of discharge medications.  Relevant Imaging Results:  Relevant Lab Results:   Additional Information SS#: [id number]  Candie Chroman, LCSW

## 2016-06-04 NOTE — Evaluation (Signed)
Physical Therapy Evaluation Patient Details Name: Linda [name] MRN: [id number] DOB: 1946-02-09 Today's Date: 06/04/2016   History of Present Illness  Adm 6/8 with acute on chronic CHF. Significant PMHx-OA, fibromyalgia, CAD, COPD  Clinical Impression  Pt admitted with above diagnosis. Patient with knees repeatedly partial buckling and catching upon first standing at EOB. Family reports this has been present for ~1 month and have been told it is due to endstage bil knee OA. Patient reporting she is at her baseline, however family does not feel she's been this weak (recently drove herself to restaurant for family event). Pt currently with functional limitations due to the deficits listed below (see PT Problem List).  Pt will benefit from skilled PT to increase their independence and safety with mobility to allow discharge to the venue listed below.      Follow Up Recommendations SNF (at her facility);Supervision for mobility/OOB   Patient is agreeable with this plan    Equipment Recommendations  None recommended by PT    Recommendations for Other Services OT consult     Precautions / Restrictions Precautions Precautions: Fall Precaution Comments: reports near falls (falls back into sitting at EOB); reports last fell Nov 2016 (sitting on her rollator fixing her breakfast and slid off the seat onto her backside). Restrictions Weight Bearing Restrictions: No      Mobility  Bed Mobility Overal bed mobility: Modified Independent             General bed mobility comments: significantly incr time (HOB flat and no rail); states it takes her 20 minutes sometimes  Transfers Overall transfer level: Needs assistance Equipment used: Rolling walker (2 wheeled) Transfers: Sit to/from Stand Sit to Stand: Min guard         General transfer comment: pt slowly able to power up to stand; cues to fully extend to upright position; knees began repeatedly buckling and catch herself with  return to sit (unintentional x 1). She reports this is normal for her and she stays close to bed when she first gets up each day.  Ambulation/Gait Ambulation/Gait assistance: Min assist Ambulation Distance (Feet): 9 Feet Assistive device: Rolling walker (2 wheeled) Gait Pattern/deviations: Step-through pattern;Shuffle;Trunk flexed Gait velocity: slow Gait velocity interpretation: Below normal speed for age/gender General Gait Details: as walking progressed, became more smooth and knee "buckling" stopped  Stairs            Wheelchair Mobility    Modified Rankin (Stroke Patients Only)       Balance Overall balance assessment: Needs assistance;History of Falls Sitting-balance support: No upper extremity supported;Feet supported Sitting balance-Leahy Scale: Fair     Standing balance support: Bilateral upper extremity supported Standing balance-Leahy Scale: Poor                               Pertinent Vitals/Pain SaO2 92% on 2L at rest; 94% on 2L with ambulation  Pain Assessment: No/denies pain    Home Living Family/patient expects to be discharged to:: Private residence Living Arrangements: Alone Available Help at Discharge: Family;Friend(s);Personal care attendant;Available PRN/intermittently Type of Home: Independent living facility Home Access: Level entry     Home Layout: One level Home Equipment: Cane - single point;Shower seat;Grab bars - toilet;Grab bars - tub/shower;Hand held shower head;Walker - 4 wheels;Other (comment);Wheelchair - Brewing technologist - built in Additional Comments: Uses rollator for ambulation; walks down long hall to elevator, down to first floor and down another hallway  to get to dining room. States she only does this for dinner usually    Prior Function Level of Independence: Needs assistance   Gait / Transfers Assistance Needed: using rollator inside; takes manual w/c with others pushing when she goes out            Hand Dominance   Dominant Hand: Right    Extremity/Trunk Assessment   Upper Extremity Assessment: Generalized weakness           Lower Extremity Assessment: Generalized weakness;RLE deficits/detail;LLE deficits/detail RLE Deficits / Details: knees buckling when she first stands (somewhat asteryxis-like); eventually sat unexpectedly LLE Deficits / Details: knees buckling when she first stands (somewhat asteryxis-like); eventually sat unexpectedly  Cervical / Trunk Assessment: Kyphotic  Communication   Communication: No difficulties  Cognition Arousal/Alertness: Lethargic;Suspect due to medications (awake on arrival; slow to respond) Behavior During Therapy: Flat affect Overall Cognitive Status: Within Functional Limits for tasks assessed                      General Comments      Exercises        Assessment/Plan    PT Assessment Patient needs continued PT services  PT Diagnosis Difficulty walking;Generalized weakness   PT Problem List Decreased strength;Decreased activity tolerance;Decreased balance;Decreased mobility;Decreased knowledge of use of DME;Obesity;Decreased safety awareness  PT Treatment Interventions DME instruction;Gait training;Functional mobility training;Therapeutic activities;Therapeutic exercise;Balance training;Patient/family education   PT Goals (Current goals can be found in the Care Plan section) Acute Rehab PT Goals Patient Stated Goal: regain strength and go back to her apartment PT Goal Formulation: With patient Time For Goal Achievement: 06/18/16 Potential to Achieve Goals: Good    Frequency Min 2X/week   Barriers to discharge Decreased caregiver support      Co-evaluation               End of Session Equipment Utilized During Treatment: Gait belt;Oxygen Activity Tolerance: Patient limited by fatigue Patient left: in chair;with call bell/phone within reach;with family/visitor present Nurse Communication: Mobility  status         Time: 1128-1202 PT Time Calculation (min) (ACUTE ONLY): 34 min   Charges:   PT Evaluation $PT Eval Moderate Complexity: 1 Procedure PT Treatments $Gait Training: 8-22 mins   PT G Codes:        Violett Hobbs 2016-06-26, 12:39 PM Pager [phone]

## 2016-06-04 NOTE — Progress Notes (Signed)
Patient Name: Linda [name] Date of Encounter: 06/04/2016  Principal Problem:   Acute on chronic diastolic heart failure ([place]) Active Problems:   HTN (hypertension), benign   Chronic respiratory failure ([place])   UTI (urinary tract infection)   Primary Cardiologist: Dr Stanford Breed  Patient Profile: 70 yo female w/ hx severe MR, history of presumed orthostatic syncope, HTN, COPD. Cardiac cath 01/2013 w/ EF 60-65%, 4+ MR, patent left main, 15-20% mid LAD, 20-30% ostial D2, 40-50% OM1, 15-20% OM 2, 50-60% mid RCA. Interestingly, echo 2016 w/ MV gradient 4, EF 123456, grade 1 diastolic dysfunction. Admitted 06/08 w/ resp failure, CHF, ?cor pulmonale. Pt also w/ Klebsiella UTI.  SUBJECTIVE: Family members in the room Feels she still has to take deep breaths at times. No chest pain  OBJECTIVE Filed Vitals:   06/03/16 2112 06/04/16 0630 06/04/16 0851 06/04/16 1206  BP:  109/71  97/62  Pulse: 81 87  89  Temp:  98.5 F (36.9 C)  98.2 F (36.8 C)  TempSrc:  Oral  Oral  Resp: 18 18  20   Height:      Weight:  172 lb 9.6 oz (78.291 kg)    SpO2: 96% 97% 94% 95%    Intake/Output Summary (Last 24 hours) at 06/04/16 1209 Last data filed at 06/04/16 1031  Gross per 24 hour  Intake    720 ml  Output   1800 ml  Net  -1080 ml   Filed Weights   06/02/16 0515 06/03/16 0657 06/04/16 0630  Weight: 174 lb 1.6 oz (78.971 kg) 171 lb 9.6 oz (77.837 kg) 172 lb 9.6 oz (78.291 kg)    PHYSICAL EXAM General: Well developed, well nourished, female in no acute distress. Head: Normocephalic, atraumatic.  Neck: Supple without bruits, JVD not elevated. Lungs:  Resp regular and unlabored, scattered rales bilaterally. Heart: Slightly irregular R&R, S1, S2, no S3, S4, soft murmur; no rub. Abdomen: Soft, non-tender, non-distended, BS + x 4.  Extremities: No clubbing, cyanosis, edema.  Neuro: Alert and oriented X 3. Moves all extremities spontaneously. Psych: Normal affect.  LABS: CBC: Recent  Labs  06/02/16 0300  WBC 7.1  HGB 12.4  HCT 40.1  MCV 93.0  PLT [id number]   Basic Metabolic Panel: Recent Labs  06/03/16 1037 06/04/16 0309  NA 136 137  K 4.8 5.0  CL 93* 93*  CO2 32 35*  GLUCOSE 130* 119*  BUN 23* 27*  CREATININE 1.18* 1.29*  CALCIUM 9.9 9.6   BNP:  B NATRIURETIC PEPTIDE  Date/Time Value Ref Range Status  05/30/2016 01:02 PM 87.9 0.0 - 100.0 pg/mL Final  04/08/2016 06:00 PM 30.2 0.0 - 100.0 pg/mL Final   TELE:  SR, w/ frequent PACs      ECHO: 06/12 - Left ventricle: The cavity size was normal. Systolic function was  normal. The estimated ejection fraction was in the range of 60%  to 65%. Wall motion was normal; there were no regional wall  motion abnormalities. Doppler parameters are consistent with  abnormal left ventricular relaxation (grade 1 diastolic  dysfunction). - Mitral valve: Calcified annulus. Impressions: - Compared to the prior study, there has been no significant  interval change.  Radiology/Studies: Dg Chest 2 View 05/31/2016  CLINICAL DATA:  Shortness of breath and swelling. EXAM: CHEST  2 VIEW COMPARISON:  04/09/2016 FINDINGS: Mild cardiac enlargement. There is aortic atherosclerosis noted. No pleural effusion or edema. No airspace consolidation. Calcified pleural plaques are identified bilaterally. IMPRESSION: 1. No acute findings. 2.  Cardiac enlargement and aortic atherosclerosis 3. Calcified pleural plaques noted compatible with prior asbestos exposure. Electronically Signed   By: Kerby Moors M.D.   On: 05/31/2016 09:35    Current Medications:  . antiseptic oral rinse  7 mL Mouth Rinse BID  . ARIPiprazole  10 mg Oral Daily  . aspirin EC  81 mg Oral QHS  . atorvastatin  20 mg Oral QHS  . ciprofloxacin  500 mg Oral BID  . DULoxetine  90 mg Oral QHS  . folic acid  2 mg Oral Daily  . furosemide  60 mg Intravenous BID  . heparin  5,000 Units Subcutaneous Q8H  . isosorbide mononitrate  30 mg Oral Daily  . metoprolol  tartrate  12.5 mg Oral BID  . mometasone-formoterol  2 puff Inhalation BID  . multivitamin with minerals  1 tablet Oral Daily  . pantoprazole  40 mg Oral Daily  . potassium chloride SA  40 mEq Oral BID  . pregabalin  100 mg Oral Daily   And  . pregabalin  300 mg Oral QHS  . sodium chloride flush  3 mL Intravenous Q12H  . traZODone  150 mg Oral QHS  . Vitamin D (Ergocalciferol)  50,000 Units Oral Q Mon      ASSESSMENT AND PLAN: Principal Problem:  Acute on chronic diastolic heart failure ([place]) - had some dizziness yesterday, orthostatic VS were negative (chronically on oxycodone and Xanax) - echo w/ nl EF, grade 1 dd - follow BMET, d/c Kdur - feel the patient is approaching her respiratory baseline - has IV Lasix this am, will change to po. - home dose was 40 mg qd, will change to 40 mg bid  Active Problems:  HTN (hypertension), benign - good control on current rx   Chronic respiratory failure (HCC) - home O2 3 lpm - need assessment of functional status, PT has seen, results pending   UTI (urinary tract infection) - start Cipro 500 mg bid - sensitive to Cipro but with cardiac issues, pharmacy feels Septra is better, ok to change - treat for 3 days - remove foley  Plan: d/c when medically stable, may need higher level of care as OP, currently in independent living.  Jonetta Speak , PA-C 12:09 PM 06/04/2016  Agree with note by Rosaria Ferries PA-C  Good diuresis > 3L. NSR. VSS. Diastolic CHF. Appears euvolemic. ATBX for KLEB UTI. OK for DC to SNF as interim prior to returning home. Pt of Dr Liliane Bade   Lorretta Harp, M.D., [place], [facility], [place] [address] [place], [place]  [address]  [phone] 06/04/2016 1:21 PM

## 2016-06-04 NOTE — Clinical Social Work Note (Signed)
Clinical Social Work Assessment  Patient Details  Name: Linda [name] MRN: [id number] Date of Birth: July 14, 1946  Date of referral:  06/04/16               Reason for consult:  Facility Placement, Discharge Planning                Permission sought to share information with:  Facility Sport and exercise psychologist, Family Supports Permission granted to share information::  Yes, Verbal Permission Granted  Name::     Linda [name]  Agency::  SNF's  Relationship::  Son  Contact Information:  [phone]  Housing/Transportation Living arrangements for the past 2 months:  [place] of Information:  Patient, Medical Team Patient Interpreter Needed:  None Criminal Activity/Legal Involvement Pertinent to Current Situation/Hospitalization:  No - Comment as needed Significant Relationships:  Adult Children Lives with:  Facility Resident, Self Do you feel safe going back to the place where you live?  Yes Need for family participation in patient care:  Yes (Comment)  Care giving concerns:  PT recommending SNF.   Social Worker assessment / plan:  CSW met with patient. No supports at bedside. CSW introduced role and explained that discharge planning would be discussed. Patient agreeable to SNF but says she gets rehab at her ILF, [facility] 3-5 days per week. Explained that there is concern that at this time she needs more supervision until she builds up her strength. Patient concerned about getting clothes before she goes to SNF. CSW encouraged patient to have her son or daughter-in-law do so as she will likely need PTAR and they will not be able to take her home to pick that up. Discussed SNF preferences. No further concerns. CSW encouraged patient to contact CSW as needed. CSW will continue to follow patient and facilitate discharge to SNF once medically stable.  Employment status:  Retired Forensic scientist:  Medicare PT Recommendations:  [place] / Referral to community resources:  [place]  Patient/Family's Response to care:  Patient agreeable to SNF placement. Patient's family reportedly supportive and involved in patient's care. Patient polite and appreciated social work intervention.  Patient/Family's Understanding of and Emotional Response to Diagnosis, Current Treatment, and Prognosis:  Patient appears to be aware of medical intervention. She is slow to respond in conversation. Patient aware of plan for discharge to SNF once medically stable.  Emotional Assessment Appearance:  Appears stated age Attitude/Demeanor/Rapport:   (Pleasant) Affect (typically observed):  Accepting, Appropriate, Calm, Pleasant Orientation:  Oriented to Self, Oriented to Place, Oriented to  Time, Oriented to Situation Alcohol / Substance use:  Never Used Psych involvement (Current and /or in the community):  No (Comment)  Discharge Needs  Concerns to be addressed:  Care Coordination Readmission within the last 30 days:  No Current discharge risk:  Dependent with Mobility, Lives alone Barriers to Discharge:  No Barriers Identified   Candie Chroman, LCSW 06/04/2016, 2:53 PM

## 2016-06-04 NOTE — Clinical Social Work Placement (Signed)
   CLINICAL SOCIAL WORK PLACEMENT  NOTE  Date:  06/04/2016  Patient Details  Name: Linda [name] MRN: [id number] Date of Birth: 1946-01-23  Clinical Social Work is seeking post-discharge placement for this patient at the [place] level of care (*CSW will initial, date and re-position this form in  chart as items are completed):  Yes   Patient/family provided with [place] Work Department's list of facilities offering this level of care within the geographic area requested by the patient (or if unable, by the patient's family).  Yes   Patient/family informed of their freedom to choose among providers that offer the needed level of care, that participate in Medicare, Medicaid or managed care program needed by the patient, have an available bed and are willing to accept the patient.  Yes   Patient/family informed of [facility]'s ownership interest in [facility] and [facility], as well as of the fact that they are under no obligation to receive care at these facilities.  PASRR submitted to EDS on 06/04/16     PASRR number received on       Existing PASRR number confirmed on       FL2 transmitted to all facilities in geographic area requested by pt/family on       FL2 transmitted to all facilities within larger geographic area on       Patient informed that his/her managed care company has contracts with or will negotiate with certain facilities, including the following:            Patient/family informed of bed offers received.  Patient chooses bed at       Physician recommends and patient chooses bed at      Patient to be transferred to   on  .  Patient to be transferred to facility by       Patient family notified on   of transfer.  Name of family member notified:        PHYSICIAN       Additional Comment:    _______________________________________________ Candie Chroman, LCSW 06/04/2016, 3:19 PM

## 2016-06-04 NOTE — Progress Notes (Signed)
Received message from [facility] with Arville Go (new name of [place] agency [facility]) for [facility] and PT as prior to admission; Aneta Mins [phone]

## 2016-06-05 LAB — BASIC METABOLIC PANEL
ANION GAP: 9 (ref 5–15)
BUN: 32 mg/dL — ABNORMAL HIGH (ref 6–20)
CALCIUM: 9.7 mg/dL (ref 8.9–10.3)
CO2: 33 mmol/L — ABNORMAL HIGH (ref 22–32)
Chloride: 94 mmol/L — ABNORMAL LOW (ref 101–111)
Creatinine, Ser: 1.25 mg/dL — ABNORMAL HIGH (ref 0.44–1.00)
GFR, EST AFRICAN AMERICAN: 49 mL/min — AB (ref >60)
GFR, EST NON AFRICAN AMERICAN: 43 mL/min — AB (ref >60)
Glucose, Bld: 127 mg/dL — ABNORMAL HIGH (ref 65–99)
Potassium: 4.7 mmol/L (ref 3.5–5.1)
SODIUM: 136 mmol/L (ref 135–145)

## 2016-06-05 MED ORDER — PREGABALIN 100 MG PO CAPS: 300.0000 mg | ORAL_CAPSULE | Freq: Every day | ORAL | Status: DC

## 2016-06-05 MED ORDER — BISACODYL 10 MG RE SUPP
10.0000 mg | Freq: Every day | RECTAL | Status: DC | PRN
  Administered 2016-06-06: 10 mg via RECTAL
  Filled 2016-06-05: qty 1

## 2016-06-05 NOTE — Clinical Social Work Note (Signed)
CSW met with patient. No supports at bedside. CSW made patient aware that she has been accepted at [facility] but there are probably only semi-private rooms available right now. Jolyne Loa, admissions coordinator stated that she is working on finding a private room for her. Patient wanted CSW to contact her son, Astrid Vides and ask his preferences on facilities. CSW contacted patient's son and he said that he did not have a preference but just wanted to her to go somewhere to be comfortable. Patient's on concerned about recent weight gain.  Dayton Scrape, [place]

## 2016-06-05 NOTE — Progress Notes (Addendum)
Spoke with pt's daughter. Daughter is concerned that pt is receiving too much Lyrica. Paged cardiology PA on call. Received orders to hold today's dose of Lyrica. Will continue to monitor.

## 2016-06-05 NOTE — Progress Notes (Signed)
Patient Name: Linda [name] Date of Encounter: 06/05/2016  Principal Problem:   Acute on chronic diastolic heart failure ([place]) Active Problems:   HTN (hypertension), benign   Chronic respiratory failure ([place])   CAD-minor 2014   UTI (urinary tract infection)   Primary Cardiologist: Dr Stanford Breed  Patient Profile: 70 yo female w/ hx MR (not seen on echo 06/02/16), history of presumed orthostatic syncope, HTN, COPD. Cardiac cath 01/2013 w/ EF 60-65%, 4+ MR then, patent left main, 15-20% mid LAD, 20-30% ostial D2, 40-50% OM1, 15-20% OM 2, 50-60% mid RCA. Interestingly, echo 2016 w/ MV gradient 4, EF 123456, grade 1 diastolic dysfunction. Admitted 05/30/16 w/ resp failure, CHF, ?cor pulmonale. Pt also w/ Klebsiella UTI.  SUBJECTIVE: Better, still SOB with activity. She has not had BM since adm.  No chest pain  OBJECTIVE Filed Vitals:   06/04/16 2051 06/04/16 2219 06/05/16 0529 06/05/16 0654  BP: 113/62   110/58  Pulse: 84   77  Temp: 98.3 F (36.8 C)   97.9 F (36.6 C)  TempSrc: Oral   Oral  Resp: 17   18  Height:      Weight:   174 lb 1.6 oz (78.971 kg)   SpO2: 93% 96%  97%    Intake/Output Summary (Last 24 hours) at 06/05/16 1022 Last data filed at 06/05/16 0858  Gross per 24 hour  Intake    780 ml  Output   1125 ml  Net   -345 ml   Filed Weights   06/03/16 0657 06/04/16 0630 06/05/16 0529  Weight: 171 lb 9.6 oz (77.837 kg) 172 lb 9.6 oz (78.291 kg) 174 lb 1.6 oz (78.971 kg)    PHYSICAL EXAM General: Well developed, well nourished, female in no acute distress. Head: Normocephalic, atraumatic.  Neck: Supple without bruits, JVD not elevated. Lungs:  Resp regular and unlabored, scattered rales bilaterally. Heart: Slightly irregular R&R, S1, S2, no S3, S4, soft murmur; no rub. Abdomen: Soft, non-tender, non-distended, BS + x 4.  Extremities: No clubbing, cyanosis, edema.  Neuro: Alert and oriented X 3. Moves all extremities spontaneously. Psych: Normal  affect.  LABS: CBC:No results for input(s): WBC, NEUTROABS, HGB, HCT, MCV, PLT in the last 72 hours. Basic Metabolic Panel:  Recent Labs  06/04/16 0309 06/05/16 0422  NA 137 136  K 5.0 4.7  CL 93* 94*  CO2 35* 33*  GLUCOSE 119* 127*  BUN 27* 32*  CREATININE 1.29* 1.25*  CALCIUM 9.6 9.7   BNP:  B NATRIURETIC PEPTIDE  Date/Time Value Ref Range Status  05/30/2016 01:02 PM 87.9 0.0 - 100.0 pg/mL Final  04/08/2016 06:00 PM 30.2 0.0 - 100.0 pg/mL Final   TELE:  SR, w/ frequent PACs      ECHO: 06/12 - Left ventricle: The cavity size was normal. Systolic function was  normal. The estimated ejection fraction was in the range of 60%  to 65%. Wall motion was normal; there were no regional wall  motion abnormalities. Doppler parameters are consistent with  abnormal left ventricular relaxation (grade 1 diastolic  dysfunction). - Mitral valve: Calcified annulus. Impressions: - Compared to the prior study, there has been no significant  interval change.  Radiology/Studies: Dg Chest 2 View 05/31/2016  CLINICAL DATA:  Shortness of breath and swelling. EXAM: CHEST  2 VIEW COMPARISON:  04/09/2016 FINDINGS: Mild cardiac enlargement. There is aortic atherosclerosis noted. No pleural effusion or edema. No airspace consolidation. Calcified pleural plaques are identified bilaterally. IMPRESSION: 1. No acute findings.  2. Cardiac enlargement and aortic atherosclerosis 3. Calcified pleural plaques noted compatible with prior asbestos exposure. Electronically Signed   By: Kerby Moors M.D.   On: 05/31/2016 09:35    Current Medications:  . antiseptic oral rinse  7 mL Mouth Rinse BID  . ARIPiprazole  10 mg Oral Daily  . aspirin EC  81 mg Oral QHS  . atorvastatin  20 mg Oral QHS  . DULoxetine  90 mg Oral QHS  . folic acid  2 mg Oral Daily  . furosemide  40 mg Oral BID  . heparin  5,000 Units Subcutaneous Q8H  . isosorbide mononitrate  30 mg Oral Daily  . metoprolol tartrate  12.5 mg  Oral BID  . mometasone-formoterol  2 puff Inhalation BID  . multivitamin with minerals  1 tablet Oral Daily  . pantoprazole  40 mg Oral Daily  . pregabalin  100 mg Oral Daily   And  . pregabalin  300 mg Oral QHS  . sodium chloride flush  3 mL Intravenous Q12H  . sulfamethoxazole-trimethoprim  1 tablet Oral Q12H  . traZODone  150 mg Oral QHS  . Vitamin D (Ergocalciferol)  50,000 Units Oral Q Mon      ASSESSMENT AND PLAN: Principal Problem:  Acute on chronic diastolic heart failure ([place]) - had some dizziness, orthostatic VS were negative (chronically on oxycodone and Xanax) - echo w/ nl EF, grade 1 dd- no significant MR on recent echo - feel the patient is approaching her respiratory baseline - Lasix changed to po,  home dose was 40 mg qd, will change to 40 mg bid  Active Problems:  HTN (hypertension), benign - good control on current rx   Chronic respiratory failure ([place]) - home O2 3 lpm -  PT has seen, SNF recomended   UTI (urinary tract infection) - on Cipro 500 mg bid - sensitive to Septra- will complete 3 days as ordered - remove foley  Plan: SNF search in progress. Add Dulcolax.   Angelena Form , PA-C 10:22 AM 06/05/2016 Patient seen and examined. I agree with the assessment and plan as detailed above. See also my additional thoughts below.   The patient continues to slowly improve. I agree completely that nursing home placement will be appropriate. She is on all appropriate medications. I agree with the note as outlined above.  Dola Argyle, MD, [facility] 06/05/2016 10:36 AM

## 2016-06-05 NOTE — Progress Notes (Signed)
Removed foley per order. Will continue to monitor pt.

## 2016-06-06 ENCOUNTER — Ambulatory Visit: Payer: Medicare Other | Admitting: Family Medicine

## 2016-06-06 ENCOUNTER — Other Ambulatory Visit: Payer: Self-pay | Admitting: *Deleted

## 2016-06-06 DIAGNOSIS — J9610 Chronic respiratory failure, unspecified whether with hypoxia or hypercapnia: Secondary | ICD-10-CM | POA: Diagnosis not present

## 2016-06-06 DIAGNOSIS — R2681 Unsteadiness on feet: Secondary | ICD-10-CM | POA: Diagnosis not present

## 2016-06-06 DIAGNOSIS — F329 Major depressive disorder, single episode, unspecified: Secondary | ICD-10-CM | POA: Diagnosis not present

## 2016-06-06 DIAGNOSIS — Z8719 Personal history of other diseases of the digestive system: Secondary | ICD-10-CM | POA: Diagnosis not present

## 2016-06-06 DIAGNOSIS — M797 Fibromyalgia: Secondary | ICD-10-CM | POA: Diagnosis not present

## 2016-06-06 DIAGNOSIS — I509 Heart failure, unspecified: Secondary | ICD-10-CM | POA: Diagnosis not present

## 2016-06-06 DIAGNOSIS — R5381 Other malaise: Secondary | ICD-10-CM | POA: Diagnosis present

## 2016-06-06 DIAGNOSIS — J9611 Chronic respiratory failure with hypoxia: Secondary | ICD-10-CM | POA: Diagnosis not present

## 2016-06-06 DIAGNOSIS — R531 Weakness: Secondary | ICD-10-CM | POA: Diagnosis not present

## 2016-06-06 DIAGNOSIS — M25561 Pain in right knee: Secondary | ICD-10-CM | POA: Diagnosis not present

## 2016-06-06 DIAGNOSIS — K5900 Constipation, unspecified: Secondary | ICD-10-CM | POA: Diagnosis not present

## 2016-06-06 DIAGNOSIS — E785 Hyperlipidemia, unspecified: Secondary | ICD-10-CM | POA: Diagnosis not present

## 2016-06-06 DIAGNOSIS — Z5189 Encounter for other specified aftercare: Secondary | ICD-10-CM | POA: Diagnosis not present

## 2016-06-06 DIAGNOSIS — G8929 Other chronic pain: Secondary | ICD-10-CM | POA: Diagnosis not present

## 2016-06-06 DIAGNOSIS — I25119 Atherosclerotic heart disease of native coronary artery with unspecified angina pectoris: Secondary | ICD-10-CM | POA: Diagnosis not present

## 2016-06-06 DIAGNOSIS — M25511 Pain in right shoulder: Secondary | ICD-10-CM | POA: Diagnosis not present

## 2016-06-06 DIAGNOSIS — I10 Essential (primary) hypertension: Secondary | ICD-10-CM | POA: Diagnosis not present

## 2016-06-06 DIAGNOSIS — E876 Hypokalemia: Secondary | ICD-10-CM | POA: Diagnosis not present

## 2016-06-06 DIAGNOSIS — R262 Difficulty in walking, not elsewhere classified: Secondary | ICD-10-CM | POA: Diagnosis not present

## 2016-06-06 DIAGNOSIS — M545 Low back pain: Secondary | ICD-10-CM | POA: Diagnosis not present

## 2016-06-06 DIAGNOSIS — M25562 Pain in left knee: Secondary | ICD-10-CM | POA: Diagnosis not present

## 2016-06-06 DIAGNOSIS — N289 Disorder of kidney and ureter, unspecified: Secondary | ICD-10-CM | POA: Diagnosis not present

## 2016-06-06 DIAGNOSIS — F419 Anxiety disorder, unspecified: Secondary | ICD-10-CM | POA: Diagnosis not present

## 2016-06-06 DIAGNOSIS — G4700 Insomnia, unspecified: Secondary | ICD-10-CM | POA: Diagnosis not present

## 2016-06-06 DIAGNOSIS — M6281 Muscle weakness (generalized): Secondary | ICD-10-CM | POA: Diagnosis not present

## 2016-06-06 DIAGNOSIS — I5033 Acute on chronic diastolic (congestive) heart failure: Secondary | ICD-10-CM | POA: Diagnosis not present

## 2016-06-06 DIAGNOSIS — E559 Vitamin D deficiency, unspecified: Secondary | ICD-10-CM | POA: Diagnosis not present

## 2016-06-06 DIAGNOSIS — M150 Primary generalized (osteo)arthritis: Secondary | ICD-10-CM | POA: Diagnosis not present

## 2016-06-06 DIAGNOSIS — R0602 Shortness of breath: Secondary | ICD-10-CM | POA: Diagnosis not present

## 2016-06-06 DIAGNOSIS — K219 Gastro-esophageal reflux disease without esophagitis: Secondary | ICD-10-CM | POA: Diagnosis not present

## 2016-06-06 DIAGNOSIS — I5032 Chronic diastolic (congestive) heart failure: Secondary | ICD-10-CM | POA: Diagnosis not present

## 2016-06-06 MED ORDER — DULOXETINE HCL 60 MG PO CPEP: 60.0000 mg | ORAL_CAPSULE | Freq: Every day | ORAL | Status: DC

## 2016-06-06 MED ORDER — ALPRAZOLAM 0.5 MG PO TABS: 0.5000 mg | ORAL_TABLET | Freq: Three times a day (TID) | ORAL | Status: DC | PRN

## 2016-06-06 MED ORDER — ACETAMINOPHEN 325 MG PO TABS: 650.0000 mg | ORAL_TABLET | ORAL | Status: DC | PRN

## 2016-06-06 MED ORDER — ALPRAZOLAM 1 MG PO TABS: 0.5000 mg | ORAL_TABLET | Freq: Three times a day (TID) | ORAL | Status: DC | PRN

## 2016-06-06 MED ORDER — OXYCODONE-ACETAMINOPHEN 5-325 MG PO TABS: 1.0000 | ORAL_TABLET | Freq: Four times a day (QID) | ORAL | Status: DC | PRN

## 2016-06-06 NOTE — Care Management Important Message (Signed)
Important Message  Patient Details  Name: Linda [name] MRN: [id number] Date of Birth: Apr 05, 1946   Medicare Important Message Given:  Yes    Loann Quill 06/06/2016, 9:42 AM

## 2016-06-06 NOTE — Care Management Note (Signed)
Case Management Note  Patient Details  Name: ADASYN CAPPUCCI MRN: [id number] Date of Birth: July 29, 1946  Subjective/Objective:   70 y.o. F admitted 05/30/2016 with Acute on Chronic Diastolic Heart Failure. Will be returning to [facility] at discharge. Discharge has been deferred to [place].                  Action/Plan: Anticipate discharge to [facility] today. No further CM needs but will be available should additional discharge needs arise.   Expected Discharge Date:                  Expected Discharge Plan:     In-House Referral:  Clinical Social Work  Discharge planning Services  CM Consult  Post Acute Care Choice:    Choice offered to:     DME Arranged:    DME Agency:     HH Arranged:    [place] Agency:     Status of Service:  Completed, signed off  Medicare Important Message Given:  Yes Date Medicare IM Given:    Medicare IM give by:    Date Additional Medicare IM Given:    Additional Medicare Important Message give by:     If discussed at [place] of Stay Meetings, dates discussed:    Additional Comments:  Delrae Sawyers, RN 06/06/2016, 11:12 AM

## 2016-06-06 NOTE — Progress Notes (Signed)
Patient Name:  Linda [name], DOB: Oct 26, 1946, MRN: [id number] Primary Doctor: Tammi Sou, MD Primary Cardiologist:   Date: 06/06/2016   SUBJECTIVE:  The patient has no significant complaints today.   Past Medical History  Diagnosis Date  . HTN (hypertension)   . Depression   . Recurrent UTI     hx of hospitalization for pyelonephritis; started abx prophylaxis 06/2015  . Hay fever   . Mixed incontinence urge and stress   . Diverticular disease   . Insomnia   . Fibromyalgia     Patient states dx was around her late 47s but she had sx's for years prior to this.  . Syncope     Hypotensive; ED visit--Dr. Terrence Dupont did Cath--nonobstructive CAD, EF 55-60%.  In retrospect, suspect pt rx med misuse/polypharmacy  . Idiopathic angio-edema-urticaria 72014    Angioedema component was very minimal  . Asthma     w/ asbestososis   . History of pneumonia     hospitalized 12/2011, 02/2013, and 07/2013 [facility]) for this  . Anginal pain ([place])     Nonobstructive CAD 2014; however, her cardiologist put her on a statin for this and NOT for hyperlipidemia per pt report.  . OSA on CPAP     prior to move to [place]--had another sleep study 10/2015 w/pulm Dr. Camillo Flaming.  . H/O hiatal hernia   . Migraine syndrome     "not as often anymore; used to be ~ q wk" (07/12/2013)  . Tension headache, chronic   . DDD (degenerative disc disease)     lumbar and cervical.   . Osteoarthritis     "severe; progressing fast" (07/12/2013); multiple joints-not surgical candidate for TKR (03/2015)  . Chronic lower back pain   . Anxiety     panic attacks  . Nephrolithiasis     "passed all on my own or they are still in there" (07/12/2013)  . Pyelonephritis     "several times over the last 30 yr" (07/12/2013)  . Diastolic congestive heart failure ([place])   . COPD (chronic obstructive pulmonary disease) ([place])   . Pulmonary embolism ([place]) 07/2013    Dx at [facility] with very small peripheral upper lobe pe 07/2013: pt took  coumadin for about 8-9 mo  . Pleural plaque with presence of asbestos 07/22/2013  . BPPV (benign paroxysmal positional vertigo) 12/16/2012  . RBBB (right bundle branch block)   . Acute upper GI bleed 06/2014    while pt taking coumadin, plavix, and meloxicam---despite being told not to take coumadin.  . Iron deficiency anemia     Hematologist in [place], [place] did extensive w/u; no cause found; failed oral supplement;; gets fairly regular (q51m or so) IV iron infusions (Venofer -iron sucrose- 200mg  with procrit.  "for 14 yr I've been getting blood work q month & getting infusions prn" (07/12/2013).  Dr. Marin Olp locally, iron infusions done, EPO deficiency dx'd  . Pernicious anemia 08/24/2014   Filed Vitals:   06/05/16 1133 06/05/16 2004 06/06/16 0506 06/06/16 0812  BP: 106/51 105/55 119/62   Pulse: 76 67 82   Temp: 98.4 F (36.9 C) 98.2 F (36.8 C) 97.8 F (36.6 C)   TempSrc: Oral Oral Oral   Resp: 18 18 19    Height:      Weight:   174 lb 8 oz (79.153 kg)   SpO2: 97% 98% 96% 96%    Intake/Output Summary (Last 24 hours) at 06/06/16 [id number] Last data filed at 06/06/16 0011  Gross per  24 hour  Intake    360 ml  Output      0 ml  Net    360 ml   Filed Weights   06/04/16 0630 06/05/16 0529 06/06/16 0506  Weight: 172 lb 9.6 oz (78.291 kg) 174 lb 1.6 oz (78.971 kg) 174 lb 8 oz (79.153 kg)     LABS: Basic Metabolic Panel:  Recent Labs  06/04/16 0309 06/05/16 0422  NA 137 136  K 5.0 4.7  CL 93* 94*  CO2 35* 33*  GLUCOSE 119* 127*  BUN 27* 32*  CREATININE 1.29* 1.25*  CALCIUM 9.6 9.7   Liver Function Tests: No results for input(s): AST, ALT, ALKPHOS, BILITOT, PROT, ALBUMIN in the last 72 hours. No results for input(s): LIPASE, AMYLASE in the last 72 hours. CBC: No results for input(s): WBC, NEUTROABS, HGB, HCT, MCV, PLT in the last 72 hours. Cardiac Enzymes: No results for input(s): CKTOTAL, CKMB, CKMBINDEX, TROPONINI in the last 72 hours. BNP: Invalid input(s):  POCBNP D-Dimer: No results for input(s): DDIMER in the last 72 hours. Thyroid Function Tests: No results for input(s): TSH, T4TOTAL, T3FREE, THYROIDAB in the last 72 hours.  Invalid input(s): FREET3  RADIOLOGY: Dg Chest 2 View  05/31/2016  CLINICAL DATA:  Shortness of breath and swelling. EXAM: CHEST  2 VIEW COMPARISON:  04/09/2016 FINDINGS: Mild cardiac enlargement. There is aortic atherosclerosis noted. No pleural effusion or edema. No airspace consolidation. Calcified pleural plaques are identified bilaterally. IMPRESSION: 1. No acute findings. 2. Cardiac enlargement and aortic atherosclerosis 3. Calcified pleural plaques noted compatible with prior asbestos exposure. Electronically Signed   By: Kerby Moors M.D.   On: 05/31/2016 09:35    PHYSICAL EXAM  The patient is oriented to person time and place. Affect is normal. She is very frail. Lungs reveal scattered rhonchi. Cardiac exam reveals S1 and S2. There is no significant peripheral edema.   TELEMETRY: I have reviewed telemetry today June 06, 2016. There is normal sinus rhythm.    ASSESSMENT AND PLAN:  Acute on chronic diastolic heart failure ([place]) - had some dizziness, orthostatic VS were negative (chronically on oxycodone and Xanax) - echo w/ nl EF, grade 1 dd- no significant MR on recent echo It appears that the patient is at baseline. She is weak and frail. She will be going to a nursing home. We are adjusting her diuretic dosing carefully.  Active Problems:  HTN (hypertension), benign - good control on current rx   Chronic respiratory failure (HCC) - home O2 3 lpm - PT has seen, SNF is being planned for today.   UTI (urinary tract infection) - on Cipro 500 mg bid - sensitive to Septra- will complete 3 days as ordered - remove foley  Overall the patient seems to be at baseline. She is weak and frail. She is going to a nursing home.   Dola Argyle 06/06/2016 9:03 AM

## 2016-06-06 NOTE — Clinical Social Work Note (Signed)
CSW facilitated patient discharge including contacting patient family and facility to confirm patient discharge plans. Clinical information faxed to facility and family agreeable with plan. CSW arranged ambulance transport via [facility] to [facility]. RN to call report prior to discharge ([phone]).  CSW will sign off for now as social work intervention is no longer needed. Please consult us again if new needs arise.  Jillienne Egner, CSW [phone]   

## 2016-06-06 NOTE — Discharge Summary (Signed)
Patient ID: Linda [name],  MRN: [id number], DOB/AGE: July 02, 1946 70 y.o.  Admit date: 05/30/2016 Discharge date: 06/06/2016  Primary Care Provider: Tammi Sou, MD Primary Cardiologist: Dr Stanford Breed  Discharge Diagnoses Principal Problem:   Acute on chronic diastolic heart failure [facility]) Active Problems:   Debilitated patient   HTN (hypertension), benign   Chronic respiratory failure [facility])   CAD-minor 2014   UTI (urinary tract infection)    Procedures: Echo 06/02/16   Hospital Course:  70 yo female, lives alone,  w/ hx MR (not seen on echo 06/02/16), history of presumed orthostatic syncope, HTN, and COPD. Cardiac cath done 01/2013 w/ EF 60-65%, 4+ MR then, patent left main, 15-20% mid LAD, 20-30% ostial D2, 40-50% OM1, 15-20% OM 2, 50-60% mid RCA.  She was admitted from the office 05/30/16 after she presented with increasing edema and weakness. Her BNP was only 89 on admission, CXR showed no CHF with asbestosis. The pt was placed on diuretics and diuresed 2 lbs, 3L. Echo done 06/02/16 showed her EF to be 60-65% with grade 1 diastolic dysfunction (no MR noted). The pt is frail and after evaluation by PT it was felt she would benifit from SNF placement. We feel she is stable for discharge to [facility] 06/06/16. Also during this hospitalization she was noted to have a Klebsiella UTI and this was treated. It's noted that the pt is on several psychotropic medications. Her daughter lives in [place]. These medications should probably adjusted as an OP by the SNF providers. I have cut her Lyrica, Cymbalta, and Xanax back at discharge.   Discharge Vitals:  Blood pressure 112/68, pulse 78, temperature 98.3 F (36.8 C), temperature source Oral, resp. rate 18, height 5\' 4"  (1.626 m), weight 174 lb 8 oz (79.153 kg), SpO2 92 %.    Labs: No results found for this or any previous visit (from the past 24 hour(s)).  Disposition:      Follow-up Information    Follow up with [name], MD.    Specialty:  Cardiology   Why:  office will conatct you   Contact information:   [address] [place] [address] [phone]       Follow up with [facility] .   Specialty:  [place] information:   [place] [phone]      Discharge Medications:    Medication List    STOP taking these medications        HAIR SKIN NAILS PO     oxyCODONE-acetaminophen 10-325 MG tablet  Commonly known as:  PERCOCET  Replaced by:  oxyCODONE-acetaminophen 5-325 MG tablet      TAKE these medications        acetaminophen 325 MG tablet  Commonly known as:  TYLENOL  Take 2 tablets (650 mg total) by mouth every 4 (four) hours as needed for headache or mild pain.     albuterol 108 (90 Base) MCG/ACT inhaler  Commonly known as:  PROVENTIL HFA;VENTOLIN HFA  Inhale 2 puffs into the lungs every 6 (six) hours as needed for wheezing or shortness of breath.     ALPRAZolam 0.5 MG tablet  Commonly known as:  XANAX  Take 1 tablet (0.5 mg total) by mouth 3 (three) times daily as needed for anxiety.     ARIPiprazole 10 MG tablet  Commonly known as:  ABILIFY  Take 1 tablet (10 mg total) by mouth daily.  aspirin 81 MG tablet  Take 81 mg by mouth at bedtime.     atorvastatin 20 MG tablet  Commonly known as:  LIPITOR  Take 20 mg by mouth at bedtime.     budesonide-formoterol 160-4.5 MCG/ACT inhaler  Commonly known as:  SYMBICORT  Inhale 2 puffs into the lungs 2 (two) times daily.     diclofenac sodium 1 % Gel  Commonly known as:  VOLTAREN  Apply 1 application topically 3 (three) times daily as needed (pain).     DULoxetine 60 MG capsule  Commonly known as:  CYMBALTA  Take 1 capsule (60 mg total) by mouth at bedtime.     folic acid 1 MG tablet  Commonly known as:  FOLVITE  Take 2 tablets (2 mg total) by mouth daily.     furosemide 40 MG tablet  Commonly known as:  LASIX  Take 40 mg by mouth  daily.     ibuprofen 800 MG tablet  Commonly known as:  ADVIL,MOTRIN  Take 800 mg by mouth daily as needed (pain).     isosorbide mononitrate 30 MG 24 hr tablet  Commonly known as:  IMDUR  Take 30 mg by mouth daily.     multivitamin with minerals Tabs tablet  Take 1 tablet by mouth daily.     naphazoline-pheniramine 0.025-0.3 % ophthalmic solution  Commonly known as:  NAPHCON-A  Place 1 drop into both eyes 4 (four) times daily as needed for irritation.     nebivolol 5 MG tablet  Commonly known as:  BYSTOLIC  Take 5 mg by mouth daily.     nitroGLYCERIN 0.4 MG SL tablet  Commonly known as:  NITROSTAT  Place 0.4 mg under the tongue every 5 (five) minutes as needed for chest pain (x 3 doses).     ondansetron 4 MG disintegrating tablet  Commonly known as:  ZOFRAN-ODT  Take 4 mg by mouth every 8 (eight) hours as needed for nausea or vomiting.     OSTEO BI-FLEX ADV JOINT SHIELD Tabs  Take 1 tablet by mouth daily.     oxyCODONE-acetaminophen 5-325 MG tablet  Commonly known as:  ROXICET  Take 1 tablet by mouth every 6 (six) hours as needed for severe pain.     OXYGEN  Inhale 3 L into the lungs continuous.     pantoprazole 40 MG tablet  Commonly known as:  PROTONIX  Take 1 tablet (40 mg total) by mouth daily.     polyethylene glycol packet  Commonly known as:  MIRALAX / GLYCOLAX  Take 17 g by mouth daily as needed (constipation). Mix in 8 oz liquid and drink     potassium chloride SA 20 MEQ tablet  Commonly known as:  K-DUR,KLOR-CON  Take 20-40 mEq by mouth 2 (two) times daily. Take 2 tablets (40 meq) by mouth every morning and 1 tablet (20 meq) at night     pregabalin 300 MG capsule  Commonly known as:  LYRICA  Take 300 mg by mouth at bedtime. Take 100 mg in the morning and 300 mg at bedtime     traZODone 50 MG tablet  Commonly known as:  DESYREL  Take 50 mg by mouth at bedtime. INSOMNIA     Vitamin D3 50000 units Caps  Take 1 capsule by mouth every Monday.          Duration of Discharge Encounter: Greater than 30 minutes including physician time.  Angelena Form PA-C 06/06/2016 12:43 PM  Patient seen and  examined. I agree with the assessment and plan as detailed above. See also my additional thoughts below.   Progress note for today has been written. I made the decision for discharge. The patient is being discharged to a nursing facility. I agree with the discharge note and plans as outlined above.  Dola Argyle, MD, [facility] 06/07/2016 6:23 AM

## 2016-06-06 NOTE — Clinical Social Work Placement (Signed)
   CLINICAL SOCIAL WORK PLACEMENT  NOTE  Date:  06/06/2016  Patient Details  Name: Linda [name] MRN: [id number] Date of Birth: [birth year]  Clinical Social Work is seeking post-discharge placement for this patient at the [place] level of care (*CSW will initial, date and re-position this form in  chart as items are completed):  Yes   Patient/family provided with [place] Work Department's list of facilities offering this level of care within the geographic area requested by the patient (or if unable, by the patient's family).  Yes   Patient/family informed of their freedom to choose among providers that offer the needed level of care, that participate in Medicare, Medicaid or managed care program needed by the patient, have an available bed and are willing to accept the patient.  Yes   Patient/family informed of [place]'s ownership interest in [facility] and [facility], as well as of the fact that they are under no obligation to receive care at these facilities.  PASRR submitted to [facility] on 06/04/16     PASRR number received on 06/06/16     Existing PASRR number confirmed on       FL2 transmitted to all facilities in geographic area requested by pt/family on 06/04/16     FL2 transmitted to all facilities within larger geographic area on       Patient informed that his/her managed care company has contracts with or will negotiate with certain facilities, including the following:        Yes   Patient/family informed of bed offers received.  Patient [facility] bed at [facility]     Physician recommends and patient chooses bed at      Patient to be transferred to [facility] on 06/06/16.  Patient to be transferred to facility by [facility]     Patient family notified on 06/06/16 of transfer.  Name of family member notified:  Tom     PHYSICIAN Please prepare prescriptions     Additional Comment:     _______________________________________________ Candie Chroman, LCSW 06/06/2016, 11:42 AM

## 2016-06-06 NOTE — Patient Outreach (Signed)
[facility]) Care Management  06/06/2016  Linda [name] [birth year] [id number]  Pt discharged from the hospital to SNF [facility]) and will be followed by [facility] social worker. This RN will re-evaluate if pt continues to need community case management services upon her discharge from the facility for ongoing home visits.   [name], RN Care Management Coordinator [place] Office [phone]

## 2016-06-06 NOTE — Clinical Social Work Note (Signed)
30-day note signed and submitted to [facility] for PASRR. CSW made them aware that PASRR was needed ASAP because patient is discharging today.  Dayton Scrape, [place]

## 2016-06-06 NOTE — Progress Notes (Signed)
Called report to [facility]. Pt picked up by [facility].

## 2016-06-07 ENCOUNTER — Non-Acute Institutional Stay (SKILLED_NURSING_FACILITY): Payer: Medicare Other | Admitting: Adult Health

## 2016-06-07 ENCOUNTER — Encounter: Payer: Self-pay | Admitting: Adult Health

## 2016-06-07 DIAGNOSIS — I10 Essential (primary) hypertension: Secondary | ICD-10-CM

## 2016-06-07 DIAGNOSIS — E785 Hyperlipidemia, unspecified: Secondary | ICD-10-CM

## 2016-06-07 DIAGNOSIS — J9610 Chronic respiratory failure, unspecified whether with hypoxia or hypercapnia: Secondary | ICD-10-CM

## 2016-06-07 DIAGNOSIS — R531 Weakness: Secondary | ICD-10-CM | POA: Diagnosis not present

## 2016-06-07 DIAGNOSIS — E876 Hypokalemia: Secondary | ICD-10-CM

## 2016-06-07 DIAGNOSIS — I25119 Atherosclerotic heart disease of native coronary artery with unspecified angina pectoris: Secondary | ICD-10-CM | POA: Diagnosis not present

## 2016-06-07 DIAGNOSIS — Z8719 Personal history of other diseases of the digestive system: Secondary | ICD-10-CM

## 2016-06-07 DIAGNOSIS — I5033 Acute on chronic diastolic (congestive) heart failure: Secondary | ICD-10-CM

## 2016-06-07 DIAGNOSIS — K5901 Slow transit constipation: Secondary | ICD-10-CM

## 2016-06-07 DIAGNOSIS — M797 Fibromyalgia: Secondary | ICD-10-CM

## 2016-06-07 DIAGNOSIS — G4700 Insomnia, unspecified: Secondary | ICD-10-CM

## 2016-06-07 DIAGNOSIS — E559 Vitamin D deficiency, unspecified: Secondary | ICD-10-CM | POA: Diagnosis not present

## 2016-06-07 DIAGNOSIS — M159 Polyosteoarthritis, unspecified: Secondary | ICD-10-CM

## 2016-06-07 DIAGNOSIS — F329 Major depressive disorder, single episode, unspecified: Secondary | ICD-10-CM

## 2016-06-07 DIAGNOSIS — E538 Deficiency of other specified B group vitamins: Secondary | ICD-10-CM

## 2016-06-07 DIAGNOSIS — M150 Primary generalized (osteo)arthritis: Secondary | ICD-10-CM

## 2016-06-07 DIAGNOSIS — F419 Anxiety disorder, unspecified: Secondary | ICD-10-CM

## 2016-06-07 NOTE — Progress Notes (Signed)
Patient ID: Linda [name], female   DOB: 11/17/1946, 70 y.o.   MRN: [id number]    DATE:  06/07/2016   MRN:  [id number]  BIRTHDAY: Jun 25, 1946  Facility:  Nursing Home Location:  [place] Room Number: 207-P  LEVEL OF CARE:  SNF (31)  Contact Information    Name Relation Home Work [place] Son   [phone]   Welford Roche Daughter   [phone]   Verta Ellen   [phone]       Code Status History    Date Active Date Inactive Code Status Order ID Comments User Context   05/30/2016 12:44 PM 06/06/2016  5:38 PM Full Code [id number]  Almyra Deforest, PA Inpatient   04/08/2016  5:18 PM 04/11/2016  7:16 PM Full Code [id number]  Isaiah Serge, NP Inpatient   01/01/2016  9:49 PM 01/03/2016  8:24 PM Full Code [id number]  Nat Math, MD Inpatient   10/16/2015  1:06 PM 10/20/2015  6:30 PM Full Code [id number]  Burtis Junes, NP Inpatient   07/26/2014  3:45 PM 08/08/2014  5:21 PM Full Code [id number]  Bary Leriche, PA-C Inpatient   07/17/2014 10:48 PM 07/26/2014  3:45 PM Full Code [id number]  Rise Patience, MD Inpatient   04/24/2014  1:40 AM 04/28/2014  4:35 PM Full Code [id number]  Theressa Millard, MD Inpatient   07/20/2013  7:19 PM 07/26/2013  8:28 PM Full Code [id number]  Orson Eva, MD Inpatient   07/12/2013  5:26 PM 07/19/2013  4:31 PM Full Code [id number]  Ripudeep Krystal Eaton, MD Inpatient       Chief Complaint  Patient presents with  . Hospitalization Follow-up    HISTORY OF PRESENT ILLNESS:  This is a 70 year old female who has been admitted to [facility] on 06/06/16 from [facility]. She has history of pressumed orthostatic syncope, hypertension and COPD. She was treated in the hospital for increasing edema and weakness. Her BNP was only 89 and CXR showed no CHF. She was given diuretics and diuresed 2 lbs, 3L. Echo done on 06/02/16 showed EF 60-65% with grade 1 diastolic dysfunction (no MR noted). She was treated in the hospital for Klebsiella  UTI. It was noted that she is on several psychotropic medications and dosage for Lyrica, Cymbalta and Xanax were decreased.  She has been admitted for a short-term rehabilitation.  PAST MEDICAL HISTORY:  Past Medical History  Diagnosis Date  . HTN (hypertension)   . Depression   . Recurrent UTI     hx of hospitalization for pyelonephritis; started abx prophylaxis 06/2015  . Hay fever   . Mixed incontinence urge and stress   . Diverticular disease   . Insomnia   . Fibromyalgia     Patient states dx was around her late 67s but she had sx's for years prior to this.  . Syncope     Hypotensive; ED visit--Dr. Terrence Dupont did Cath--nonobstructive CAD, EF 55-60%.  In retrospect, suspect pt rx med misuse/polypharmacy  . Idiopathic angio-edema-urticaria 72014    Angioedema component was very minimal  . Asthma     w/ asbestososis   . History of pneumonia     hospitalized 12/2011, 02/2013, and 07/2013 [facility]) for this  . Anginal pain ([place])     Nonobstructive CAD 2014; however, her cardiologist put her on a statin for this and NOT for hyperlipidemia per pt report.  . OSA on CPAP     prior to move to  [place]--had another sleep study 10/2015 w/pulm Dr. Camillo Flaming.  . H/O hiatal hernia   . Migraine syndrome     "not as often anymore; used to be ~ q wk" (07/12/2013)  . Tension headache, chronic   . DDD (degenerative disc disease)     lumbar and cervical.   . Osteoarthritis     "severe; progressing fast" (07/12/2013); multiple joints-not surgical candidate for TKR (03/2015)  . Chronic lower back pain   . Anxiety     panic attacks  . Nephrolithiasis     "passed all on my own or they are still in there" (07/12/2013)  . Pyelonephritis     "several times over the last 30 yr" (07/12/2013)  . Diastolic congestive heart failure ([place])   . COPD (chronic obstructive pulmonary disease) ([place])   . Pulmonary embolism ([place]) 07/2013    Dx at [facility] with very small peripheral upper lobe pe 07/2013: pt took coumadin for about 8-9 mo   . Pleural plaque with presence of asbestos 07/22/2013  . BPPV (benign paroxysmal positional vertigo) 12/16/2012  . RBBB (right bundle branch block)   . Acute upper GI bleed 06/2014    while pt taking coumadin, plavix, and meloxicam---despite being told not to take coumadin.  . Iron deficiency anemia     Hematologist in [place], [place] did extensive w/u; no cause found; failed oral supplement;; gets fairly regular (q30m or so) IV iron infusions (Venofer -iron sucrose- 200mg  with procrit.  "for 14 yr I've been getting blood work q month & getting infusions prn" (07/12/2013).  Dr. Marin Olp locally, iron infusions done, EPO deficiency dx'd  . Pernicious anemia 08/24/2014     CURRENT MEDICATIONS: Reviewed  Patient's Medications  New Prescriptions   No medications on file  Previous Medications   ACETAMINOPHEN (TYLENOL) 325 MG TABLET    Take 2 tablets (650 mg total) by mouth every 4 (four) hours as needed for headache or mild pain.   ALBUTEROL (PROVENTIL HFA;VENTOLIN HFA) 108 (90 BASE) MCG/ACT INHALER    Inhale 2 puffs into the lungs every 6 (six) hours as needed for wheezing or shortness of breath.   ALPRAZOLAM (XANAX) 0.5 MG TABLET    Take 1 tablet (0.5 mg total) by mouth 3 (three) times daily as needed for anxiety.   ARIPIPRAZOLE (ABILIFY) 10 MG TABLET    Take 1 tablet (10 mg total) by mouth daily.   ASPIRIN 81 MG TABLET    Take 81 mg by mouth at bedtime.   ATORVASTATIN (LIPITOR) 20 MG TABLET    Take 20 mg by mouth at bedtime.   BUDESONIDE-FORMOTEROL (SYMBICORT) 160-4.5 MCG/ACT INHALER    Inhale 2 puffs into the lungs 2 (two) times daily.   CHOLECALCIFEROL (VITAMIN D3) 50000 UNITS CAPS    Take 1 capsule by mouth every Monday.    DICLOFENAC SODIUM (VOLTAREN) 1 % GEL    Apply 1 application topically 3 (three) times daily as needed (pain).    DULOXETINE (CYMBALTA) 60 MG CAPSULE    Take 1 capsule (60 mg total) by mouth at bedtime.   FOLIC ACID (FOLVITE) 1 MG TABLET    Take 2 tablets (2 mg total) by mouth  daily.   FUROSEMIDE (LASIX) 40 MG TABLET    Take 40 mg by mouth daily.   IBUPROFEN (ADVIL,MOTRIN) 800 MG TABLET    Take 800 mg by mouth daily as needed (pain).   ISOSORBIDE MONONITRATE (IMDUR) 30 MG 24 HR TABLET    Take 30 mg by mouth daily.  MULTIPLE VITAMIN (MULTIVITAMIN WITH MINERALS) TABS TABLET    Take 1 tablet by mouth daily.   NAPHAZOLINE-PHENIRAMINE (NAPHCON-A) 0.025-0.3 % OPHTHALMIC SOLUTION    Place 1 drop into both eyes 4 (four) times daily as needed for irritation.   NEBIVOLOL (BYSTOLIC) 5 MG TABLET    Take 5 mg by mouth daily.   NITROGLYCERIN (NITROSTAT) 0.4 MG SL TABLET    Place 0.4 mg under the tongue every 5 (five) minutes as needed for chest pain (x 3 doses).    ONDANSETRON (ZOFRAN-ODT) 4 MG DISINTEGRATING TABLET    Take 4 mg by mouth every 8 (eight) hours as needed for nausea or vomiting.   OXYCODONE-ACETAMINOPHEN (PERCOCET) 10-325 MG TABLET    Take 1 tablet by mouth every 8 (eight) hours as needed for pain.   OXYGEN    Inhale 3 L into the lungs continuous.   PANTOPRAZOLE (PROTONIX) 40 MG TABLET    Take 1 tablet (40 mg total) by mouth daily.   POLYETHYLENE GLYCOL (MIRALAX / GLYCOLAX) PACKET    Take 17 g by mouth daily as needed (constipation). Mix in 8 oz liquid and drink   POTASSIUM CHLORIDE SA (K-DUR,KLOR-CON) 20 MEQ TABLET    Take 20-40 mEq by mouth 2 (two) times daily. Take 2 tablets (40 meq) by mouth every morning and 1 tablet (20 meq) at night   PREGABALIN (LYRICA) 100 MG CAPSULE    Take 100 mg by mouth daily. Take QAM   PREGABALIN (LYRICA) 300 MG CAPSULE    Take 300 mg by mouth at bedtime.    TRAZODONE (DESYREL) 50 MG TABLET    Take 50 mg by mouth at bedtime. INSOMNIA  Modified Medications   No medications on file  Discontinued Medications   MISC NATURAL PRODUCTS (OSTEO BI-FLEX ADV JOINT SHIELD) TABS    Take 1 tablet by mouth daily.   OXYCODONE-ACETAMINOPHEN (ROXICET) 5-325 MG TABLET    Take 1 tablet by mouth every 6 (six) hours as needed for severe pain.      Allergies  Allergen Reactions  . Penicillins Itching, Swelling and Rash    Tolerated Cefepime in ED. Has patient had a PCN reaction causing immediate rash, facial/tongue/throat swelling, SOB or lightheadedness with hypotension: Yes Has patient had a PCN reaction causing severe rash involving mucus membranes or skin necrosis: No Has patient had a PCN reaction that required hospitalization: No  Has patient had a PCN reaction occurring within the last 10 years: No      REVIEW OF SYSTEMS:  GENERAL: no change in appetite, no fatigue, no weight changes, no fever, chills or weakness SKIN: Denies rash, itching, wounds, ulcer sores, or nail abnormality EYES: Denies change in vision, dry eyes, eye pain, itching or discharge EARS: Denies change in hearing, ringing in ears, or earache NOSE: Denies nasal congestion or epistaxis MOUTH and THROAT: Denies oral discomfort, gingival pain or bleeding, pain from teeth or hoarseness   RESPIRATORY: no cough, SOB, DOE, wheezing, hemoptysis CARDIAC: no chest pain, edema or palpitations GI: no abdominal pain, diarrhea, constipation, heart burn, nausea or vomiting GU: Denies dysuria, frequency, hematuria, incontinence, or discharge PSYCHIATRIC: Denies feeling of depression or anxiety. No report of hallucinations, insomnia, paranoia, or agitation    PHYSICAL EXAMINATION  GENERAL APPEARANCE: Well nourished. In no acute distress. Normal body habitus SKIN:  Skin is warm and dry.  HEAD: Normal in size and contour. No evidence of trauma EYES: Lids open and close normally. No blepharitis, entropion or ectropion. PERRL. Conjunctivae are clear and  sclerae are white. Lenses are without opacity EARS: Pinnae are normal. Patient hears normal voice tunes of the examiner MOUTH and THROAT: Lips are without lesions. Oral mucosa is moist and without lesions. Tongue is normal in shape, size, and color and without lesions NECK: supple, trachea midline, no neck masses,  no thyroid tenderness, no thyromegaly LYMPHATICS: no LAN in the neck, no supraclavicular LAN RESPIRATORY: breathing is even & unlabored, BS CTAB; on 3L/min O2 via [place] CARDIAC: RRR, no murmur,no extra heart sounds, no edema GI: abdomen soft, normal BS, no masses, no tenderness, no hepatomegaly, no splenomegaly EXTREMITIES:  Able to move X 4 extremities PSYCHIATRIC: Alert and oriented X 3. Affect and behavior are appropriate  LABS/RADIOLOGY: Labs reviewed: Basic Metabolic Panel:  Recent Labs  01/02/16 0610 01/03/16 0515  04/08/16 1800  06/03/16 1037 06/04/16 0309 06/05/16 0422  NA 145 143  < > 142  < > 136 137 136  K 4.0 3.8  < > 4.1  < > 4.8 5.0 4.7  CL 107 107  < > 101  < > 93* 93* 94*  CO2 30 28  < > 31  < > 32 35* 33*  GLUCOSE 154* 134*  < > 91  < > 130* 119* 127*  BUN 7 9  < > 11  < > 23* 27* 32*  CREATININE 0.67 0.75  < > 0.94  < > 1.18* 1.29* 1.25*  CALCIUM 8.4* 8.5*  < > 9.5  < > 9.9 9.6 9.7  MG 1.7 2.0  --  2.0  --   --   --   --   PHOS 3.4 3.6  --   --   --   --   --   --   < > = values in this interval not displayed. Liver Function Tests:  Recent Labs  01/03/16 0515 04/08/16 1800 05/30/16 1302  AST 24 18 19   ALT 12* 13* 15  ALKPHOS 64 73 75  BILITOT 0.6 1.0 0.7  PROT 6.0* 6.8 6.5  ALBUMIN 3.3* 3.7 3.5    CBC:  Recent Labs  01/08/16 1353 04/08/16 1800 05/30/16 1302 06/02/16 0300  WBC 6.6 6.1 5.9 7.1  NEUTROABS 3.7 3.3 3.5  --   HGB 13.1 13.9 11.8* 12.4  HCT 39.9 42.5 37.2 40.1  MCV 88.2 89.5 89.9 93.0  PLT 215.0 134* 171 194   Cardiac Enzymes:  Recent Labs  01/01/16 1127 01/02/16 0622  05/30/16 1302 05/30/16 1815 05/31/16 0030  CKTOTAL 880* 864*  --   --   --   --   TROPONINI  --   --   < > <0.03 <0.03 <0.03  < > = values in this interval not displayed.  CBG:  Recent Labs  10/12/15 1915 10/12/15 2014  GLUCAP 64* 97     Dg Chest 2 View  05/31/2016  CLINICAL DATA:  Shortness of breath and swelling. EXAM: CHEST  2 VIEW  COMPARISON:  04/09/2016 FINDINGS: Mild cardiac enlargement. There is aortic atherosclerosis noted. No pleural effusion or edema. No airspace consolidation. Calcified pleural plaques are identified bilaterally. IMPRESSION: 1. No acute findings. 2. Cardiac enlargement and aortic atherosclerosis 3. Calcified pleural plaques noted compatible with prior asbestos exposure. Electronically Signed   By: Kerby Moors M.D.   On: 05/31/2016 09:35    ASSESSMENT/PLAN:  Generalized weakness - for rehabilitation, PT and OT  Chronic respiratory failure - continue O2 at 3 L/minute via [place], Proventil HFA 90 g inhaler inhale 2 puffs into lungs  every 6 hours when necessary, Symbicort 160-4 0.5 g inhaler inhale 2 puffs into lungs twice a day  Acute on chronic diastolic heart failure - she was diuresed 2 lbs, 3 L/min via [place], Lasix 40 mg 1 tab by mouth daily, Bystolic 5 mg 1 tab by mouth daily; check BMP; follow-up with cardiology, Dr. [name]; weigh every M-W-F  CAD - stable; continue isosorbide mononitrate ER 30 mg 1 tab by mouth daily, nitroglycerin when necessary, aspirin 81 mg 1 tab by mouth daily at bedtime  Hypertension - continue Bystolic 5 mg 1 tab by mouth daily  History of GI bleed - continue pantoprazole sodium DR 40 mg 1 tab by mouth daily  Hypokalemia - continue KCL ER 20 meq give 2 tabs = 40 meq by mouth every morning  Fibromyalgia - continue Lyrica 100 mg 1 capsule by mouth every a.m. and 3 mg by mouth daily at bedtime, acetaminophen 325 mg take 2 tabs = 650 mg by mouth every 4 hours when necessary, Percocet 10/325 mg 1 tab by mouth every 8 hours when necessary; physiatry consult  Vitamin D deficiency - continue vitamin D3 50,000 units 1 capsule by mouth every Mondays  Insomnia - continue trazodone 50 mg 1 tab by mouth daily at bedtime  Major depression - continue Abilify 10 mg 1 tab by mouth daily, Cymbalta 60 mg 1 capsule by mouth daily at bedtime, psychiatric consult with  [facility]  Hyperlipidemia - continue atorvastatin 20 mg 1 tab by mouth daily at bedtime Lab Results  Component Value Date   CHOL 152 01/31/2013   HDL 44 01/31/2013   LDLCALC 90 01/31/2013   TRIG 92 01/31/2013   CHOLHDL 3.5 01/31/2013   Anxiety -continue alprazolam 0.5 mg 1 tab by mouth 3 times a day when necessary  Osteoarthritis - continue Voltaren 1% gel topically 3 times a day when necessary, ibuprofen 200 mg give 4 tabs = 800 mg by mouth daily when necessary  Constipation - continue MiraLAX 17 g by mouth daily when necessary  Folic acid deficiency - continue folic acid 1 mg take 2 tabs = 2 mg by mouth daily; check CBC     Goals of care:  Short-term rehabilitation    Durenda Age, NP [place] [phone]

## 2016-06-10 ENCOUNTER — Other Ambulatory Visit: Payer: Self-pay | Admitting: *Deleted

## 2016-06-10 DIAGNOSIS — R5381 Other malaise: Secondary | ICD-10-CM | POA: Diagnosis not present

## 2016-06-10 DIAGNOSIS — R262 Difficulty in walking, not elsewhere classified: Secondary | ICD-10-CM | POA: Diagnosis not present

## 2016-06-10 DIAGNOSIS — M25511 Pain in right shoulder: Secondary | ICD-10-CM | POA: Diagnosis not present

## 2016-06-10 DIAGNOSIS — M25562 Pain in left knee: Secondary | ICD-10-CM | POA: Diagnosis not present

## 2016-06-10 DIAGNOSIS — R2681 Unsteadiness on feet: Secondary | ICD-10-CM | POA: Diagnosis not present

## 2016-06-10 DIAGNOSIS — M545 Low back pain: Secondary | ICD-10-CM | POA: Diagnosis not present

## 2016-06-10 DIAGNOSIS — G8929 Other chronic pain: Secondary | ICD-10-CM | POA: Diagnosis not present

## 2016-06-10 DIAGNOSIS — M25561 Pain in right knee: Secondary | ICD-10-CM | POA: Diagnosis not present

## 2016-06-10 DIAGNOSIS — M6281 Muscle weakness (generalized): Secondary | ICD-10-CM | POA: Diagnosis not present

## 2016-06-10 NOTE — Patient Outreach (Signed)
CSW attempted to reach patient via phone on 06/07/16. CSW also left message for Melisa at [facility] where patient is residing.  CSW will await callback and/or have CSW covering this week to follow up.   Eduard Clos, MSW, [place] Worker  [place] [phone]

## 2016-06-11 ENCOUNTER — Ambulatory Visit: Payer: Medicare Other | Admitting: Physical Medicine & Rehabilitation

## 2016-06-11 ENCOUNTER — Encounter: Payer: Self-pay | Admitting: Internal Medicine

## 2016-06-11 ENCOUNTER — Non-Acute Institutional Stay (SKILLED_NURSING_FACILITY): Payer: Medicare Other | Admitting: Internal Medicine

## 2016-06-11 DIAGNOSIS — F329 Major depressive disorder, single episode, unspecified: Secondary | ICD-10-CM

## 2016-06-11 DIAGNOSIS — I25119 Atherosclerotic heart disease of native coronary artery with unspecified angina pectoris: Secondary | ICD-10-CM | POA: Diagnosis not present

## 2016-06-11 DIAGNOSIS — G4700 Insomnia, unspecified: Secondary | ICD-10-CM

## 2016-06-11 DIAGNOSIS — M797 Fibromyalgia: Secondary | ICD-10-CM

## 2016-06-11 DIAGNOSIS — M25561 Pain in right knee: Secondary | ICD-10-CM | POA: Diagnosis not present

## 2016-06-11 DIAGNOSIS — N289 Disorder of kidney and ureter, unspecified: Secondary | ICD-10-CM

## 2016-06-11 DIAGNOSIS — E876 Hypokalemia: Secondary | ICD-10-CM

## 2016-06-11 DIAGNOSIS — M25511 Pain in right shoulder: Secondary | ICD-10-CM | POA: Diagnosis not present

## 2016-06-11 DIAGNOSIS — M545 Low back pain: Secondary | ICD-10-CM | POA: Diagnosis not present

## 2016-06-11 DIAGNOSIS — R5381 Other malaise: Secondary | ICD-10-CM | POA: Diagnosis not present

## 2016-06-11 DIAGNOSIS — M6281 Muscle weakness (generalized): Secondary | ICD-10-CM | POA: Diagnosis not present

## 2016-06-11 DIAGNOSIS — K5900 Constipation, unspecified: Secondary | ICD-10-CM

## 2016-06-11 DIAGNOSIS — I10 Essential (primary) hypertension: Secondary | ICD-10-CM | POA: Diagnosis not present

## 2016-06-11 DIAGNOSIS — E785 Hyperlipidemia, unspecified: Secondary | ICD-10-CM

## 2016-06-11 DIAGNOSIS — J9611 Chronic respiratory failure with hypoxia: Secondary | ICD-10-CM

## 2016-06-11 DIAGNOSIS — I5032 Chronic diastolic (congestive) heart failure: Secondary | ICD-10-CM | POA: Diagnosis not present

## 2016-06-11 DIAGNOSIS — M25562 Pain in left knee: Secondary | ICD-10-CM | POA: Diagnosis not present

## 2016-06-11 DIAGNOSIS — G8929 Other chronic pain: Secondary | ICD-10-CM | POA: Diagnosis not present

## 2016-06-11 DIAGNOSIS — R262 Difficulty in walking, not elsewhere classified: Secondary | ICD-10-CM | POA: Diagnosis not present

## 2016-06-11 DIAGNOSIS — K219 Gastro-esophageal reflux disease without esophagitis: Secondary | ICD-10-CM | POA: Diagnosis not present

## 2016-06-11 DIAGNOSIS — R2681 Unsteadiness on feet: Secondary | ICD-10-CM | POA: Diagnosis not present

## 2016-06-11 NOTE — Progress Notes (Signed)
LOCATION: [place]  PCP: Tammi Sou, MD   Code Status: Full Code  Goals of care: Advanced Directive information Advanced Directives 05/30/2016  Does patient have an advance directive? No  Type of Advance Directive -  Does patient want to make changes to advanced directive? -  Copy of advanced directive(s) in chart? -       Extended Emergency Contact Information Primary Emergency Contact: [facility] Address: [place], [place] [address] Johnnette Litter of [place] Phone: [phone] Mobile Phone: [phone] Relation: Son Secondary Emergency Contact: Caffie Damme [place] of [place] Phone: [phone] Mobile Phone: [phone] Relation: Daughter   Allergies  Allergen Reactions  . Penicillins Itching, Swelling and Rash    Tolerated Cefepime in ED. Has patient had a PCN reaction causing immediate rash, facial/tongue/throat swelling, SOB or lightheadedness with hypotension: Yes Has patient had a PCN reaction causing severe rash involving mucus membranes or skin necrosis: No Has patient had a PCN reaction that required hospitalization: No  Has patient had a PCN reaction occurring within the last 10 years: No     Chief Complaint  Patient presents with  . New Admit To SNF    New Admission     HPI:  Patient is a 70 y.o. female seen today for short term rehabilitation post hospital admission from 05/30/16-06/06/16 with acute on chronic diastolic heart failure with acute on chronic respiratory failure. She required diuresis and breathing treatment. She was also treated for klebsiella UTI. She was taken off some of her psychotropic medications. She is seen in her room today.  Review of Systems:  Constitutional: Negative for fever, chills, diaphoresis. Energy level is slowly returning.  HENT: Negative for congestion, sore throat, difficulty swallowing. Positive for occasional headaches and nasal discharges.   Eyes: Negative for  blurred vision, double vision and discharge.  Respiratory: Negative for cough and wheezing. On 3 liters of oxygen.  has dyspnea with exertion.  Cardiovascular: Negative for chest pain, palpitations, leg swelling.  Gastrointestinal: Negative for heartburn, nausea, vomiting, abdominal pain. Last bowel movement was today. Genitourinary: Negative for dysuria Musculoskeletal: Negative for back pain, fall in the facility.  Skin: Negative for itching, rash.  Neurological: Positive for dizziness with change of position. Psychiatric/Behavioral: Negative for depression   Past Medical History  Diagnosis Date  . HTN (hypertension)   . Depression   . Recurrent UTI     hx of hospitalization for pyelonephritis; started abx prophylaxis 06/2015  . Hay fever   . Mixed incontinence urge and stress   . Diverticular disease   . Insomnia   . Fibromyalgia     Patient states dx was around her late 5s but she had sx's for years prior to this.  . Syncope     Hypotensive; ED visit--Dr. Terrence Dupont did Cath--nonobstructive CAD, EF 55-60%.  In retrospect, suspect pt rx med misuse/polypharmacy  . Idiopathic angio-edema-urticaria 72014    Angioedema component was very minimal  . Asthma     w/ asbestososis   . History of pneumonia     hospitalized 12/2011, 02/2013, and 07/2013 [facility]) for this  . Anginal pain ([place])     Nonobstructive CAD 2014; however, her cardiologist put her on a statin for this and NOT for hyperlipidemia per pt report.  . OSA on CPAP     prior to move to [place]--had another sleep study 10/2015 w/pulm Dr. Camillo Flaming.  . H/O hiatal hernia   .  Migraine syndrome     "not as often anymore; used to be ~ q wk" (07/12/2013)  . Tension headache, chronic   . DDD (degenerative disc disease)     lumbar and cervical.   . Osteoarthritis     "severe; progressing fast" (07/12/2013); multiple joints-not surgical candidate for TKR (03/2015)  . Chronic lower back pain   . Anxiety     panic attacks  . Nephrolithiasis      "passed all on my own or they are still in there" (07/12/2013)  . Pyelonephritis     "several times over the last 30 yr" (07/12/2013)  . Diastolic congestive heart failure ([place])   . COPD (chronic obstructive pulmonary disease) ([place])   . Pulmonary embolism ([place]) 07/2013    Dx at [facility] with very small peripheral upper lobe pe 07/2013: pt took coumadin for about 8-9 mo  . Pleural plaque with presence of asbestos 07/22/2013  . BPPV (benign paroxysmal positional vertigo) 12/16/2012  . RBBB (right bundle branch block)   . Acute upper GI bleed 06/2014    while pt taking coumadin, plavix, and meloxicam---despite being told not to take coumadin.  . Iron deficiency anemia     Hematologist in [place], [place] did extensive w/u; no cause found; failed oral supplement;; gets fairly regular (q11m or so) IV iron infusions (Venofer -iron sucrose- 200mg  with procrit.  "for 14 yr I've been getting blood work q month & getting infusions prn" (07/12/2013).  Dr. Marin Olp locally, iron infusions done, EPO deficiency dx'd  . Pernicious anemia 08/24/2014   Past Surgical History  Procedure Laterality Date  . Appendectomy  1960  . Total abdominal hysterectomy  1974  . Tendon release  1996    Right forearm and hand  . Knee surgery  2005  . Heel spur surgery Left 2008  . Plantar fascia release Left 2008  . Axillary surgery Left 1978    Multiple "lump" in armpit per pt  . Coccyx removal  1972  . Cardiac catheterization  01/2013    nonobstructive CAD, EF 55-60%  . Transthoracic echocardiogram  01/2013; 04/2014;08/2015    2014--NORMAL.  2015--focal basal septal hypertrophy, EF 55-60%, grade I diast dysfxn, mild LAE.  08/2015 EF 55-60%, nl LV syst fxn, grade I DD, valves wnl  . Dilation and curettage of uterus  ? 1970's  . Eye surgery Left 2012-2013    "injections for ~ 1 yr; don't really know what for" (07/12/2013)  . Spirometry  04/25/14    In hosp for acute asthma/COPD flare: mixed obstructive and restrictive lung disease. The FEV1  is severely reduced at 45% predicted.  FEV1 signif decreased compared to prior spirometry 07/23/13.  [name] Kitchen Esophagogastroduodenoscopy N/A 07/19/2014    Gastritis found + in the setting of supratherapeutic INR, +plavix, + meloxicam.  . Left heart catheterization with coronary angiogram N/A 01/30/2013    Procedure: LEFT HEART CATHETERIZATION WITH CORONARY ANGIOGRAM;  Surgeon: Clent Demark, MD;  Location: [facility] CATH LAB;  Service: Cardiovascular;  Laterality: N/A;  . Cardiovascular stress test  02/22/15    Low risk myocard perf imaging; wall motion normal, normal EF   Social History:   reports that she has never smoked. She has never used smokeless tobacco. She reports that she does not drink alcohol or use illicit drugs.  Family History  Problem Relation Age of Onset  . Arthritis Mother   . Kidney disease Mother   . Heart disease Father   . Stroke Father   . Hypertension  Father   . Diabetes Father   . Heart attack Father   . Heart attack Paternal Grandmother     Medications:   Medication List       This list is accurate as of: 06/11/16  3:07 PM.  Always use your most recent med list.               acetaminophen 325 MG tablet  Commonly known as:  TYLENOL  Take 2 tablets (650 mg total) by mouth every 4 (four) hours as needed for headache or mild pain.     albuterol 108 (90 Base) MCG/ACT inhaler  Commonly known as:  PROVENTIL HFA;VENTOLIN HFA  Inhale 2 puffs into the lungs every 6 (six) hours as needed for wheezing or shortness of breath.     ALPRAZolam 0.5 MG tablet  Commonly known as:  XANAX  Take 1 tablet (0.5 mg total) by mouth 3 (three) times daily as needed for anxiety.     ARIPiprazole 10 MG tablet  Commonly known as:  ABILIFY  Take 1 tablet (10 mg total) by mouth daily.     aspirin 81 MG tablet  Take 81 mg by mouth at bedtime.     atorvastatin 20 MG tablet  Commonly known as:  LIPITOR  Take 20 mg by mouth at bedtime.     budesonide-formoterol 160-4.5 MCG/ACT inhaler    Commonly known as:  SYMBICORT  Inhale 2 puffs into the lungs 2 (two) times daily.     diclofenac sodium 1 % Gel  Commonly known as:  VOLTAREN  Apply 1 application topically 3 (three) times daily as needed (pain).     DULoxetine 60 MG capsule  Commonly known as:  CYMBALTA  Take 1 capsule (60 mg total) by mouth at bedtime.     folic acid 1 MG tablet  Commonly known as:  FOLVITE  Take 2 tablets (2 mg total) by mouth daily.     furosemide 40 MG tablet  Commonly known as:  LASIX  Take 40 mg by mouth daily.     ibuprofen 800 MG tablet  Commonly known as:  ADVIL,MOTRIN  Take 800 mg by mouth daily as needed (pain).     isosorbide mononitrate 30 MG 24 hr tablet  Commonly known as:  IMDUR  Take 30 mg by mouth daily.     multivitamin with minerals Tabs tablet  Take 1 tablet by mouth daily.     naphazoline-pheniramine 0.025-0.3 % ophthalmic solution  Commonly known as:  NAPHCON-A  Place 1 drop into both eyes 4 (four) times daily as needed for irritation.     nebivolol 5 MG tablet  Commonly known as:  BYSTOLIC  Take 5 mg by mouth daily.     nitroGLYCERIN 0.4 MG SL tablet  Commonly known as:  NITROSTAT  Place 0.4 mg under the tongue every 5 (five) minutes as needed for chest pain (x 3 doses).     ondansetron 4 MG disintegrating tablet  Commonly known as:  ZOFRAN-ODT  Take 4 mg by mouth every 8 (eight) hours as needed for nausea or vomiting.     oxyCODONE-acetaminophen 10-325 MG tablet  Commonly known as:  PERCOCET  Take 1 tablet by mouth every 6 (six) hours as needed for pain.     OXYGEN  Inhale 3 L into the lungs continuous.     pantoprazole 40 MG tablet  Commonly known as:  PROTONIX  Take 1 tablet (40 mg total) by mouth daily.  polyethylene glycol packet  Commonly known as:  MIRALAX / GLYCOLAX  Take 17 g by mouth daily as needed (constipation). Mix in 8 oz liquid and drink     potassium chloride SA 20 MEQ tablet  Commonly known as:  K-DUR,KLOR-CON  Take 20-40  mEq by mouth 2 (two) times daily. Take 2 tablets (40 meq) by mouth every morning and 1 tablet (20 meq) at night     pregabalin 300 MG capsule  Commonly known as:  LYRICA  Take 300 mg by mouth at bedtime.     pregabalin 100 MG capsule  Commonly known as:  LYRICA  Take 100 mg by mouth daily. Take QAM     traZODone 50 MG tablet  Commonly known as:  DESYREL  Take 50 mg by mouth at bedtime. INSOMNIA     Vitamin D3 50000 units Caps  Take 1 capsule by mouth every Monday.        Immunizations: Immunization History  Administered Date(s) Administered  . Influenza Split 08/23/2012  . Influenza,inj,Quad PF,36+ Mos 09/28/2013, 10/05/2014  . Pneumococcal Conjugate-13 05/03/2015  . Pneumococcal Polysaccharide-23 03/24/2011     Physical Exam: Filed Vitals:   06/11/16 1459  BP: 127/65  Pulse: 61  Temp: 97.6 F (36.4 C)  TempSrc: Oral  Resp: 20  Height: 5\' 4"  (1.626 m)  Weight: 172 lb (78.019 kg)  SpO2: 95%   Body mass index is 29.51 kg/(m^2).  General- elderly female, overweight, in no acute distress Head- normocephalic, atraumatic Nose- no maxillary or frontal sinus tenderness, no nasal discharge Throat- moist mucus membrane  Eyes- PERRLA, EOMI, no pallor, no icterus, no discharge, normal conjunctiva, normal sclera Neck- no cervical lymphadenopathy Cardiovascular- normal s1,s2, no murmur, no leg edema Respiratory- bilateral poor air movement, no wheeze, no rhonchi, no crackles, no use of accessory muscles Abdomen- bowel sounds present, soft, non tender Musculoskeletal- able to move all 4 extremities, generalized weakness, arthritis changes to her fingers Neurological- alert and oriented to person, place and time Skin- warm and dry Psychiatry- normal mood and affect    Labs reviewed: Basic Metabolic Panel:  Recent Labs  01/02/16 0610 01/03/16 0515  04/08/16 1800  06/03/16 1037 06/04/16 0309 06/05/16 0422  NA 145 143  < > 142  < > 136 137 136  K 4.0 3.8  < > 4.1   < > 4.8 5.0 4.7  CL 107 107  < > 101  < > 93* 93* 94*  CO2 30 28  < > 31  < > 32 35* 33*  GLUCOSE 154* 134*  < > 91  < > 130* 119* 127*  BUN 7 9  < > 11  < > 23* 27* 32*  CREATININE 0.67 0.75  < > 0.94  < > 1.18* 1.29* 1.25*  CALCIUM 8.4* 8.5*  < > 9.5  < > 9.9 9.6 9.7  MG 1.7 2.0  --  2.0  --   --   --   --   PHOS 3.4 3.6  --   --   --   --   --   --   < > = values in this interval not displayed. Liver Function Tests:  Recent Labs  01/03/16 0515 04/08/16 1800 05/30/16 1302  AST 24 18 19   ALT 12* 13* 15  ALKPHOS 64 73 75  BILITOT 0.6 1.0 0.7  PROT 6.0* 6.8 6.5  ALBUMIN 3.3* 3.7 3.5   No results for input(s): LIPASE, AMYLASE in the last 8760 hours. No  results for input(s): AMMONIA in the last 8760 hours. CBC:  Recent Labs  01/08/16 1353 04/08/16 1800 05/30/16 1302 06/02/16 0300  WBC 6.6 6.1 5.9 7.1  NEUTROABS 3.7 3.3 3.5  --   HGB 13.1 13.9 11.8* 12.4  HCT 39.9 42.5 37.2 40.1  MCV 88.2 89.5 89.9 93.0  PLT 215.0 134* 171 194   Cardiac Enzymes:  Recent Labs  01/01/16 1127 01/02/16 0622  05/30/16 1302 05/30/16 1815 05/31/16 0030  CKTOTAL 880* 864*  --   --   --   --   TROPONINI  --   --   < > <0.03 <0.03 <0.03  < > = values in this interval not displayed. BNP: Invalid input(s): POCBNP CBG:  Recent Labs  10/12/15 1915 10/12/15 2014  GLUCAP 64* 97    Radiological Exams: Dg Chest 2 View  05/31/2016  CLINICAL DATA:  Shortness of breath and swelling. EXAM: CHEST  2 VIEW COMPARISON:  04/09/2016 FINDINGS: Mild cardiac enlargement. There is aortic atherosclerosis noted. No pleural effusion or edema. No airspace consolidation. Calcified pleural plaques are identified bilaterally. IMPRESSION: 1. No acute findings. 2. Cardiac enlargement and aortic atherosclerosis 3. Calcified pleural plaques noted compatible with prior asbestos exposure. Electronically Signed   By: Kerby Moors M.D.   On: 05/31/2016 09:35    Assessment/Plan  Physical deconditioning Will  have her work with physical therapy and occupational therapy team to help with gait training and muscle strengthening exercises.fall precautions. Skin care. Encourage to be out of bed.   CHF S/p diuresis for chf exacerbation. Continue o2 3l/min by nasal canula. Continue imdur 30 mg daily, lasix 40 mg daily with bystolic 5 mg daily. Monitor weight.   Chronic respiratory failure Continue symbicort bid and proventil prn and monitor, continue o2 by nasal canula 3l/min  HTN Stable BP, continue imdur and bystolic and monitor bmp  CAD  Chest pain free. continue isosorbide mononitrate ER 30 mg daily, bystolic 5 mg daily and prn NTG. Continue baby aspirin and statin  HLD Continue atorvastatin  Fibromyalgia To be followed by physiatry team. continue Lyrica 100 mg am and 300 mg qhs. Continue precocet current regimen  Insomnia  continue trazodone 50 mg daily   Major depression continue Abilify 10 mg daily, Cymbalta 60 mg daily and to be seen by psych services  Impaired renal function On lasix, monitor bmp  Constipation  Continue miralax daily as needed  gerd With hx of gi bleed. continue pantoprazole 40 mg daily  Hypokalemia  Monitor bmp, on laisx, continue kcl    Goals of care: short term rehabilitation   Labs/tests ordered: cbc, cmp 06/12/16  Family/ staff Communication: reviewed care plan with patient and nursing supervisor    Blanchie Serve, MD Internal Medicine [place], [place] [address] Cell Phone (Monday-Friday 8 am - 5 pm): [phone] On Call: [phone] and follow prompts after 5 pm and on weekends Office Phone: [phone] Office Fax: [phone]

## 2016-06-12 ENCOUNTER — Other Ambulatory Visit: Payer: Self-pay | Admitting: *Deleted

## 2016-06-12 ENCOUNTER — Encounter: Payer: Self-pay | Admitting: *Deleted

## 2016-06-12 DIAGNOSIS — M25561 Pain in right knee: Secondary | ICD-10-CM | POA: Diagnosis not present

## 2016-06-12 DIAGNOSIS — R262 Difficulty in walking, not elsewhere classified: Secondary | ICD-10-CM | POA: Diagnosis not present

## 2016-06-12 DIAGNOSIS — R5381 Other malaise: Secondary | ICD-10-CM | POA: Diagnosis not present

## 2016-06-12 DIAGNOSIS — M25562 Pain in left knee: Secondary | ICD-10-CM | POA: Diagnosis not present

## 2016-06-12 DIAGNOSIS — M6281 Muscle weakness (generalized): Secondary | ICD-10-CM | POA: Diagnosis not present

## 2016-06-12 DIAGNOSIS — M25511 Pain in right shoulder: Secondary | ICD-10-CM | POA: Diagnosis not present

## 2016-06-12 DIAGNOSIS — M545 Low back pain: Secondary | ICD-10-CM | POA: Diagnosis not present

## 2016-06-12 DIAGNOSIS — R2681 Unsteadiness on feet: Secondary | ICD-10-CM | POA: Diagnosis not present

## 2016-06-12 DIAGNOSIS — G8929 Other chronic pain: Secondary | ICD-10-CM | POA: Diagnosis not present

## 2016-06-12 NOTE — Patient Outreach (Signed)
[facility]) Care [name]  06/12/2016  Linda [name] 06-07-1946 [id number]  CSW was able to make contact with patient today to perform the initial assessment, as well as assess and assist with social work needs and services, when Linda [name] met with patient at [name].  Linda [name] is the [name] where patient currently resides to receive short-term rehabilitative services, prior to returning home to live with her son, Linda [name].  CSW introduced self, explained role and types of services provided through Linda [name] (Linda [name]).  CSW further explained to patient that CSW works with patient's RNCM, also with Linda [name], Linda [name]. CSW then explained the reason for the call, indicating that Linda [name] thought that patient would benefit from social work services and resources to assist with possible discharge planning needs and services from the skilled nursing facility.  CSW obtained two HIPAA compliant identifiers from patient, which included patient's name and date of birth. Patient admits that she was recently hospitalized from 05/30/2016-06/06/2016, due to Acute on Chronic Diastolic Heart Failure with Acute on Chronic Respiratory Failure. Patient required diuresis, as well as breathing treatments. Patient reported that she was taken off some of her psychotropic medications, for which is currently experiencing symptoms of mild depression and anxiety.  Patient admits not being motivated to work with therapies (both physical and occupational), but anxious to return home to "get back into her routine of life".  No family members present at time of CSW's visit. CSW will meet with patient for the next routine visit at [facility] [name], [facility] [name] where patient currently resides to receive short-term rehabilitative services, on Wednesday, July 5th at 9:00am to assess and assist with possible discharge  planning needs and services.  CSW will contact patient's RNCM with [place] [name], Linda [name] to report findings of initial visit with patient today.  CSW will fax a correspondence letter to patient's Primary Care Physician, Linda [name] to ensure that Linda [name] is aware of CSW's involvement with patient's care.  [name], BSW, MSW, LCSW  Licensed Education officer, environmental Health [name]  Mailing [place] [address] [place], [place] [address] Physical Address-[address], [place], [place] [address] Toll Free Main # [phone] Fax # [phone] Cell # [phone]  Fax # [phone]  [email or web address]

## 2016-06-13 ENCOUNTER — Ambulatory Visit: Payer: Self-pay | Admitting: *Deleted

## 2016-06-13 DIAGNOSIS — M545 Low back pain: Secondary | ICD-10-CM | POA: Diagnosis not present

## 2016-06-13 DIAGNOSIS — M25561 Pain in right knee: Secondary | ICD-10-CM | POA: Diagnosis not present

## 2016-06-13 DIAGNOSIS — M6281 Muscle weakness (generalized): Secondary | ICD-10-CM | POA: Diagnosis not present

## 2016-06-13 DIAGNOSIS — M25562 Pain in left knee: Secondary | ICD-10-CM | POA: Diagnosis not present

## 2016-06-13 DIAGNOSIS — G8929 Other chronic pain: Secondary | ICD-10-CM | POA: Diagnosis not present

## 2016-06-13 DIAGNOSIS — R5381 Other malaise: Secondary | ICD-10-CM | POA: Diagnosis not present

## 2016-06-13 DIAGNOSIS — R262 Difficulty in walking, not elsewhere classified: Secondary | ICD-10-CM | POA: Diagnosis not present

## 2016-06-13 DIAGNOSIS — M25511 Pain in right shoulder: Secondary | ICD-10-CM | POA: Diagnosis not present

## 2016-06-13 DIAGNOSIS — R2681 Unsteadiness on feet: Secondary | ICD-10-CM | POA: Diagnosis not present

## 2016-06-14 ENCOUNTER — Non-Acute Institutional Stay (SKILLED_NURSING_FACILITY): Payer: Medicare Other | Admitting: Adult Health

## 2016-06-14 ENCOUNTER — Encounter: Payer: Self-pay | Admitting: Adult Health

## 2016-06-14 DIAGNOSIS — R531 Weakness: Secondary | ICD-10-CM

## 2016-06-14 DIAGNOSIS — F419 Anxiety disorder, unspecified: Secondary | ICD-10-CM | POA: Diagnosis not present

## 2016-06-14 DIAGNOSIS — I5033 Acute on chronic diastolic (congestive) heart failure: Secondary | ICD-10-CM | POA: Diagnosis not present

## 2016-06-14 DIAGNOSIS — E876 Hypokalemia: Secondary | ICD-10-CM | POA: Diagnosis not present

## 2016-06-14 DIAGNOSIS — F329 Major depressive disorder, single episode, unspecified: Secondary | ICD-10-CM | POA: Diagnosis not present

## 2016-06-14 DIAGNOSIS — M159 Polyosteoarthritis, unspecified: Secondary | ICD-10-CM

## 2016-06-14 DIAGNOSIS — M150 Primary generalized (osteo)arthritis: Secondary | ICD-10-CM

## 2016-06-14 DIAGNOSIS — E785 Hyperlipidemia, unspecified: Secondary | ICD-10-CM | POA: Diagnosis not present

## 2016-06-14 DIAGNOSIS — I10 Essential (primary) hypertension: Secondary | ICD-10-CM

## 2016-06-14 DIAGNOSIS — Z8719 Personal history of other diseases of the digestive system: Secondary | ICD-10-CM

## 2016-06-14 DIAGNOSIS — E559 Vitamin D deficiency, unspecified: Secondary | ICD-10-CM

## 2016-06-14 DIAGNOSIS — K5901 Slow transit constipation: Secondary | ICD-10-CM

## 2016-06-14 DIAGNOSIS — I25119 Atherosclerotic heart disease of native coronary artery with unspecified angina pectoris: Secondary | ICD-10-CM | POA: Diagnosis not present

## 2016-06-14 DIAGNOSIS — J9610 Chronic respiratory failure, unspecified whether with hypoxia or hypercapnia: Secondary | ICD-10-CM | POA: Diagnosis not present

## 2016-06-14 DIAGNOSIS — E538 Deficiency of other specified B group vitamins: Secondary | ICD-10-CM

## 2016-06-14 DIAGNOSIS — G4700 Insomnia, unspecified: Secondary | ICD-10-CM

## 2016-06-14 NOTE — Progress Notes (Signed)
Patient ID: Linda [name], female   DOB: Sep 25, 1946, 70 y.o.   MRN: [id number] Patient ID: Linda [name], female   DOB: August 23, 1946, 70 y.o.   MRN: [id number]    DATE:  06/14/2016   MRN:  [id number]  BIRTHDAY: 1946-05-28  Facility:  Nursing Home Location:  [place] Room Number: 207-P  LEVEL OF CARE:  SNF (31)  Contact Information    Name Relation Home Work [place] Son [phone]  [phone]   Welford Roche Daughter [phone]  [phone]   Verta Ellen   [phone]       Code Status History    Date Active Date Inactive Code Status Order ID Comments User Context   05/30/2016 12:44 PM 06/06/2016  5:38 PM Full Code [id number]  Almyra Deforest, PA Inpatient   04/08/2016  5:18 PM 04/11/2016  7:16 PM Full Code [id number]  Isaiah Serge, NP Inpatient   01/01/2016  9:49 PM 01/03/2016  8:24 PM Full Code [id number]  Nat Math, MD Inpatient   10/16/2015  1:06 PM 10/20/2015  6:30 PM Full Code [id number]  Burtis Junes, NP Inpatient   07/26/2014  3:45 PM 08/08/2014  5:21 PM Full Code [id number]  Bary Leriche, PA-C Inpatient   07/17/2014 10:48 PM 07/26/2014  3:45 PM Full Code [id number]  Rise Patience, MD Inpatient   04/24/2014  1:40 AM 04/28/2014  4:35 PM Full Code [id number]  Theressa Millard, MD Inpatient   07/20/2013  7:19 PM 07/26/2013  8:28 PM Full Code [id number]  Orson Eva, MD Inpatient   07/12/2013  5:26 PM 07/19/2013  4:31 PM Full Code [id number]  Ripudeep Krystal Eaton, MD Inpatient       Chief Complaint  Patient presents with  . Discharge Note    HISTORY OF PRESENT ILLNESS:  This is a 70 year old female who is for discharge home with home health PT, OT, CNA and Nursing.  She has been admitted to [facility] on 06/06/16 from [facility]. She has history of pressumed orthostatic syncope, hypertension and COPD. She was treated in the hospital for increasing edema and weakness. Her BNP was only 89 and CXR showed no CHF. She was given  diuretics and diuresed 2 lbs, 3L. Echo done on 06/02/16 showed EF 60-65% with grade 1 diastolic dysfunction (no MR noted). She was treated in the hospital for Klebsiella UTI. It was noted that she is on several psychotropic medications and dosage for Lyrica, Cymbalta and Xanax were decreased.  Patient was admitted to this facility for short-term rehabilitation after the patient's recent hospitalization.  Patient has completed SNF rehabilitation and therapy has cleared the patient for discharge.  PAST MEDICAL HISTORY:  Past Medical History  Diagnosis Date  . HTN (hypertension)   . Depression   . Recurrent UTI     hx of hospitalization for pyelonephritis; started abx prophylaxis 06/2015  . Hay fever   . Mixed incontinence urge and stress   . Diverticular disease   . Insomnia   . Fibromyalgia     Patient states dx was around her late 45s but she had sx's for years prior to this.  . Syncope     Hypotensive; ED visit--Dr. Terrence Dupont did Cath--nonobstructive CAD, EF 55-60%.  In retrospect, suspect pt rx med misuse/polypharmacy  . Idiopathic angio-edema-urticaria 72014    Angioedema component was very minimal  . Asthma     w/ asbestososis   . History of pneumonia  hospitalized 12/2011, 02/2013, and 07/2013 [facility]) for this  . Anginal pain ([place])     Nonobstructive CAD 2014; however, her cardiologist put her on a statin for this and NOT for hyperlipidemia per pt report.  . OSA on CPAP     prior to move to [place]--had another sleep study 10/2015 w/pulm Dr. Camillo Flaming.  . H/O hiatal hernia   . Migraine syndrome     "not as often anymore; used to be ~ q wk" (07/12/2013)  . Tension headache, chronic   . DDD (degenerative disc disease)     lumbar and cervical.   . Osteoarthritis     "severe; progressing fast" (07/12/2013); multiple joints-not surgical candidate for TKR (03/2015)  . Chronic lower back pain   . Anxiety     panic attacks  . Nephrolithiasis     "passed all on my own or they are still in there"  (07/12/2013)  . Pyelonephritis     "several times over the last 30 yr" (07/12/2013)  . Diastolic congestive heart failure ([place])   . COPD (chronic obstructive pulmonary disease) ([place])   . Pulmonary embolism ([place]) 07/2013    Dx at [facility] with very small peripheral upper lobe pe 07/2013: pt took coumadin for about 8-9 mo  . Pleural plaque with presence of asbestos 07/22/2013  . BPPV (benign paroxysmal positional vertigo) 12/16/2012  . RBBB (right bundle branch block)   . Acute upper GI bleed 06/2014    while pt taking coumadin, plavix, and meloxicam---despite being told not to take coumadin.  . Iron deficiency anemia     Hematologist in [place], [place] did extensive w/u; no cause found; failed oral supplement;; gets fairly regular (q24m or so) IV iron infusions (Venofer -iron sucrose- 200mg  with procrit.  "for 14 yr I've been getting blood work q month & getting infusions prn" (07/12/2013).  Dr. Marin Olp locally, iron infusions done, EPO deficiency dx'd  . Pernicious anemia 08/24/2014     CURRENT MEDICATIONS: Reviewed  Patient's Medications  New Prescriptions   No medications on file  Previous Medications   ACETAMINOPHEN (TYLENOL) 325 MG TABLET    Take 2 tablets (650 mg total) by mouth every 4 (four) hours as needed for headache or mild pain.   ALBUTEROL (PROVENTIL HFA;VENTOLIN HFA) 108 (90 BASE) MCG/ACT INHALER    Inhale 2 puffs into the lungs every 6 (six) hours as needed for wheezing or shortness of breath.   ALPRAZOLAM (XANAX) 0.5 MG TABLET    Take 1 tablet (0.5 mg total) by mouth 3 (three) times daily as needed for anxiety.   ARIPIPRAZOLE (ABILIFY) 10 MG TABLET    Take 1 tablet (10 mg total) by mouth daily.   ASPIRIN 81 MG TABLET    Take 81 mg by mouth at bedtime.   ATORVASTATIN (LIPITOR) 20 MG TABLET    Take 20 mg by mouth at bedtime.   BUDESONIDE-FORMOTEROL (SYMBICORT) 160-4.5 MCG/ACT INHALER    Inhale 2 puffs into the lungs 2 (two) times daily.   CHOLECALCIFEROL (VITAMIN D3) 50000 UNITS CAPS     Take 1 capsule by mouth every Monday.    DICLOFENAC SODIUM (VOLTAREN) 1 % GEL    Apply 1 application topically 3 (three) times daily as needed (pain).    DULOXETINE (CYMBALTA) 60 MG CAPSULE    Take 1 capsule (60 mg total) by mouth at bedtime.   FOLIC ACID (FOLVITE) 1 MG TABLET    Take 2 tablets (2 mg total) by mouth daily.   FUROSEMIDE (LASIX)  40 MG TABLET    Take 40 mg by mouth daily.   IBUPROFEN (ADVIL,MOTRIN) 800 MG TABLET    Take 800 mg by mouth daily as needed (pain).   ISOSORBIDE MONONITRATE (IMDUR) 30 MG 24 HR TABLET    Take 30 mg by mouth daily.   MULTIPLE VITAMIN (MULTIVITAMIN WITH MINERALS) TABS TABLET    Take 1 tablet by mouth daily.   NAPHAZOLINE-PHENIRAMINE (NAPHCON-A) 0.025-0.3 % OPHTHALMIC SOLUTION    Place 1 drop into both eyes 4 (four) times daily as needed for irritation.   NEBIVOLOL (BYSTOLIC) 5 MG TABLET    Take 5 mg by mouth daily.   NITROGLYCERIN (NITROSTAT) 0.4 MG SL TABLET    Place 0.4 mg under the tongue every 5 (five) minutes as needed for chest pain (x 3 doses).    ONDANSETRON (ZOFRAN-ODT) 4 MG DISINTEGRATING TABLET    Take 4 mg by mouth every 8 (eight) hours as needed for nausea or vomiting.   OXYCODONE-ACETAMINOPHEN (PERCOCET) 10-325 MG TABLET    Take 1 tablet by mouth every 8 (eight) hours. Take at 8AM, 2PM, 8PM   OXYCODONE-ACETAMINOPHEN (PERCOCET) 10-325 MG TABLET    Take 1 tablet by mouth as needed for pain.   OXYGEN    Inhale 3 L into the lungs continuous.   PANTOPRAZOLE (PROTONIX) 40 MG TABLET    Take 1 tablet (40 mg total) by mouth daily.   POLYETHYLENE GLYCOL (MIRALAX / GLYCOLAX) PACKET    Take 17 g by mouth daily as needed (constipation). Mix in 8 oz liquid and drink   POTASSIUM CHLORIDE SA (K-DUR,KLOR-CON) 20 MEQ TABLET    Take 20-40 mEq by mouth 2 (two) times daily. Take 2 tablets (40 meq) by mouth every morning and 1 tablet (20 meq) at night   PREGABALIN (LYRICA) 100 MG CAPSULE    Take 100 mg by mouth daily. Take QAM   PREGABALIN (LYRICA) 300 MG CAPSULE     Take 300 mg by mouth at bedtime.    TRAZODONE (DESYREL) 50 MG TABLET    Take 50 mg by mouth at bedtime. INSOMNIA  Modified Medications   No medications on file  Discontinued Medications   No medications on file     Allergies  Allergen Reactions  . Penicillins Itching, Swelling and Rash    Tolerated Cefepime in ED. Has patient had a PCN reaction causing immediate rash, facial/tongue/throat swelling, SOB or lightheadedness with hypotension: Yes Has patient had a PCN reaction causing severe rash involving mucus membranes or skin necrosis: No Has patient had a PCN reaction that required hospitalization: No  Has patient had a PCN reaction occurring within the last 10 years: No      REVIEW OF SYSTEMS:  GENERAL: no change in appetite, no fatigue, no weight changes, no fever, chills or weakness SKIN: Denies rash, itching, wounds, ulcer sores, or nail abnormality EYES: Denies change in vision, dry eyes, eye pain, itching or discharge EARS: Denies change in hearing, ringing in ears, or earache NOSE: Denies nasal congestion or epistaxis MOUTH and THROAT: Denies oral discomfort, gingival pain or bleeding, pain from teeth or hoarseness   RESPIRATORY: no cough, SOB, DOE, wheezing, hemoptysis CARDIAC: no chest pain, edema or palpitations GI: no abdominal pain, diarrhea, constipation, heart burn, nausea or vomiting GU: Denies dysuria, frequency, hematuria, incontinence, or discharge PSYCHIATRIC: Denies feeling of depression or anxiety. No report of hallucinations, insomnia, paranoia, or agitation    PHYSICAL EXAMINATION  GENERAL APPEARANCE: Well nourished. In no acute distress. Normal body habitus  SKIN:  Skin is warm and dry.  HEAD: Normal in size and contour. No evidence of trauma EYES: Lids open and close normally. No blepharitis, entropion or ectropion. PERRL. Conjunctivae are clear and sclerae are white. Lenses are without opacity EARS: Pinnae are normal. Patient hears normal voice  tunes of the examiner MOUTH and THROAT: Lips are without lesions. Oral mucosa is moist and without lesions. Tongue is normal in shape, size, and color and without lesions NECK: supple, trachea midline, no neck masses, no thyroid tenderness, no thyromegaly LYMPHATICS: no LAN in the neck, no supraclavicular LAN RESPIRATORY: breathing is even & unlabored, BS CTAB; on 3L/min O2 via [place] CARDIAC: RRR, no murmur,no extra heart sounds, no edema GI: abdomen soft, normal BS, no masses, no tenderness, no hepatomegaly, no splenomegaly EXTREMITIES:  Able to move X 4 extremities PSYCHIATRIC: Alert and oriented X 3. Affect and behavior are appropriate  LABS/RADIOLOGY: Labs reviewed: Basic Metabolic Panel:  Recent Labs  01/02/16 0610 01/03/16 0515  04/08/16 1800  06/03/16 1037 06/04/16 0309 06/05/16 0422  NA 145 143  < > 142  < > 136 137 136  K 4.0 3.8  < > 4.1  < > 4.8 5.0 4.7  CL 107 107  < > 101  < > 93* 93* 94*  CO2 30 28  < > 31  < > 32 35* 33*  GLUCOSE 154* 134*  < > 91  < > 130* 119* 127*  BUN 7 9  < > 11  < > 23* 27* 32*  CREATININE 0.67 0.75  < > 0.94  < > 1.18* 1.29* 1.25*  CALCIUM 8.4* 8.5*  < > 9.5  < > 9.9 9.6 9.7  MG 1.7 2.0  --  2.0  --   --   --   --   PHOS 3.4 3.6  --   --   --   --   --   --   < > = values in this interval not displayed. Liver Function Tests:  Recent Labs  01/03/16 0515 04/08/16 1800 05/30/16 1302  AST 24 18 19   ALT 12* 13* 15  ALKPHOS 64 73 75  BILITOT 0.6 1.0 0.7  PROT 6.0* 6.8 6.5  ALBUMIN 3.3* 3.7 3.5    CBC:  Recent Labs  01/08/16 1353 04/08/16 1800 05/30/16 1302 06/02/16 0300  WBC 6.6 6.1 5.9 7.1  NEUTROABS 3.7 3.3 3.5  --   HGB 13.1 13.9 11.8* 12.4  HCT 39.9 42.5 37.2 40.1  MCV 88.2 89.5 89.9 93.0  PLT 215.0 134* 171 194   Cardiac Enzymes:  Recent Labs  01/01/16 1127 01/02/16 0622  05/30/16 1302 05/30/16 1815 05/31/16 0030  CKTOTAL 880* 864*  --   --   --   --   TROPONINI  --   --   < > <0.03 <0.03 <0.03  < > = values  in this interval not displayed.  CBG:  Recent Labs  10/12/15 1915 10/12/15 2014  GLUCAP 64* 97     Dg Chest 2 View  05/31/2016  CLINICAL DATA:  Shortness of breath and swelling. EXAM: CHEST  2 VIEW COMPARISON:  04/09/2016 FINDINGS: Mild cardiac enlargement. There is aortic atherosclerosis noted. No pleural effusion or edema. No airspace consolidation. Calcified pleural plaques are identified bilaterally. IMPRESSION: 1. No acute findings. 2. Cardiac enlargement and aortic atherosclerosis 3. Calcified pleural plaques noted compatible with prior asbestos exposure. Electronically Signed   By: Kerby Moors M.D.   On: 05/31/2016 09:35  ASSESSMENT/PLAN:  Generalized weakness - for Home health PT, Nursing, CNA and OT  Chronic respiratory failure - continue O2 at 3 L/minute via [place], Proventil HFA 90 g inhaler inhale 2 puffs into lungs every 6 hours when necessary, Symbicort 160-4 0.5 g inhaler inhale 2 puffs into lungs twice a day  Acute on chronic diastolic heart failure - she was diuresed 2 lbs, 3 L/min via , Lasix 40 mg 1 tab by mouth daily, Bystolic 5 mg 1 tab by mouth daily;  follow-up with cardiology, Dr. [name]; weigh every M-W-F  CAD - stable; continue isosorbide mononitrate ER 30 mg 1 tab by mouth daily, nitroglycerin when necessary, aspirin 81 mg 1 tab by mouth daily at bedtime  Hypertension - continue Bystolic 5 mg 1 tab by mouth daily  History of GI bleed - continue pantoprazole sodium DR 40 mg 1 tab by mouth daily  Hypokalemia - continue KCL ER 20 meq give 2 tabs = 40 meq by mouth every morning Lab Results  Component Value Date   K 4.7 06/05/2016    Fibromyalgia - continue Lyrica 100 mg 1 capsule by mouth every a.m. and 3 mg by mouth daily at bedtime, acetaminophen 325 mg take 2 tabs = 650 mg by mouth every 4 hours when necessary, Percocet 10/325 mg 1 tab by mouth every 8 hours and 1 tab PO Q D  when necessary  Vitamin D deficiency - continue vitamin D3 50,000  units 1 capsule by mouth every Mondays  Insomnia - continue trazodone 50 mg 1 tab by mouth daily at bedtime  Major depression - continue Abilify 10 mg 1 tab by mouth daily, Cymbalta 60 mg 1 capsule by mouth daily at bedtime  Hyperlipidemia - continue atorvastatin 20 mg 1 tab by mouth daily at bedtime Lab Results  Component Value Date   CHOL 152 01/31/2013   HDL 44 01/31/2013   LDLCALC 90 01/31/2013   TRIG 92 01/31/2013   CHOLHDL 3.5 01/31/2013   Anxiety -continue alprazolam 0.5 mg 1 tab by mouth 3 times a day when necessary  Osteoarthritis - continue Voltaren 1% gel topically 3 times a day when necessary, ibuprofen 200 mg give 4 tabs = 800 mg by mouth daily when necessary  Constipation - continue MiraLAX 17 g by mouth daily when necessary  Folic acid deficiency - continue folic acid 1 mg take 2 tabs = 2 mg by mouth daily     I have filled out patient's discharge paperwork and written prescriptions.  Patient will receive home health PT, OT, Nursing and CNA.  DME provided:  None  Total discharge time: Greater than 30 minutes  Discharge time involved coordination of the discharge process with social worker, nursing staff and therapy department. Medical justification for home health services verified.     Durenda Age, NP [employer] [phone]

## 2016-06-15 DIAGNOSIS — I5033 Acute on chronic diastolic (congestive) heart failure: Secondary | ICD-10-CM | POA: Diagnosis not present

## 2016-06-15 DIAGNOSIS — I110 Hypertensive heart disease with heart failure: Secondary | ICD-10-CM | POA: Diagnosis not present

## 2016-06-15 DIAGNOSIS — G8929 Other chronic pain: Secondary | ICD-10-CM | POA: Diagnosis not present

## 2016-06-15 DIAGNOSIS — I2510 Atherosclerotic heart disease of native coronary artery without angina pectoris: Secondary | ICD-10-CM | POA: Diagnosis not present

## 2016-06-15 DIAGNOSIS — M797 Fibromyalgia: Secondary | ICD-10-CM | POA: Diagnosis not present

## 2016-06-15 DIAGNOSIS — F329 Major depressive disorder, single episode, unspecified: Secondary | ICD-10-CM | POA: Diagnosis not present

## 2016-06-17 ENCOUNTER — Telehealth: Payer: Self-pay | Admitting: Cardiology

## 2016-06-17 DIAGNOSIS — J449 Chronic obstructive pulmonary disease, unspecified: Secondary | ICD-10-CM

## 2016-06-17 DIAGNOSIS — I5033 Acute on chronic diastolic (congestive) heart failure: Secondary | ICD-10-CM

## 2016-06-17 DIAGNOSIS — F339 Major depressive disorder, recurrent, unspecified: Secondary | ICD-10-CM | POA: Diagnosis not present

## 2016-06-17 DIAGNOSIS — J61 Pneumoconiosis due to asbestos and other mineral fibers: Secondary | ICD-10-CM

## 2016-06-17 DIAGNOSIS — I110 Hypertensive heart disease with heart failure: Secondary | ICD-10-CM | POA: Diagnosis not present

## 2016-06-17 DIAGNOSIS — J9610 Chronic respiratory failure, unspecified whether with hypoxia or hypercapnia: Secondary | ICD-10-CM

## 2016-06-17 NOTE — Telephone Encounter (Signed)
New message    Needs a order to say for CHF manage 2 times a week for 4 weeks with 2 PRNS visits for any problems, so the home health can continue the visits, please

## 2016-06-17 NOTE — Telephone Encounter (Signed)
Agree Linda [name]  

## 2016-06-17 NOTE — Telephone Encounter (Signed)
Spoke to [employer]  order given to continue with visit  CHF management  2 times for 4 weeks with 2 prns visit for problems

## 2016-06-19 ENCOUNTER — Encounter: Payer: Self-pay | Admitting: Physical Medicine & Rehabilitation

## 2016-06-19 ENCOUNTER — Encounter: Payer: Medicare Other | Attending: Physical Medicine & Rehabilitation | Admitting: Physical Medicine & Rehabilitation

## 2016-06-19 VITALS — BP 107/59 | HR 77

## 2016-06-19 DIAGNOSIS — J449 Chronic obstructive pulmonary disease, unspecified: Secondary | ICD-10-CM | POA: Diagnosis not present

## 2016-06-19 DIAGNOSIS — I110 Hypertensive heart disease with heart failure: Secondary | ICD-10-CM | POA: Diagnosis not present

## 2016-06-19 DIAGNOSIS — M1711 Unilateral primary osteoarthritis, right knee: Secondary | ICD-10-CM | POA: Diagnosis not present

## 2016-06-19 DIAGNOSIS — J61 Pneumoconiosis due to asbestos and other mineral fibers: Secondary | ICD-10-CM | POA: Diagnosis not present

## 2016-06-19 DIAGNOSIS — M797 Fibromyalgia: Secondary | ICD-10-CM | POA: Insufficient documentation

## 2016-06-19 DIAGNOSIS — Z5181 Encounter for therapeutic drug level monitoring: Secondary | ICD-10-CM | POA: Insufficient documentation

## 2016-06-19 DIAGNOSIS — Z79899 Other long term (current) drug therapy: Secondary | ICD-10-CM | POA: Insufficient documentation

## 2016-06-19 DIAGNOSIS — I2510 Atherosclerotic heart disease of native coronary artery without angina pectoris: Secondary | ICD-10-CM

## 2016-06-19 DIAGNOSIS — F339 Major depressive disorder, recurrent, unspecified: Secondary | ICD-10-CM | POA: Diagnosis not present

## 2016-06-19 DIAGNOSIS — R2689 Other abnormalities of gait and mobility: Secondary | ICD-10-CM | POA: Insufficient documentation

## 2016-06-19 DIAGNOSIS — M1712 Unilateral primary osteoarthritis, left knee: Secondary | ICD-10-CM | POA: Insufficient documentation

## 2016-06-19 DIAGNOSIS — M75101 Unspecified rotator cuff tear or rupture of right shoulder, not specified as traumatic: Secondary | ICD-10-CM | POA: Diagnosis not present

## 2016-06-19 DIAGNOSIS — J9610 Chronic respiratory failure, unspecified whether with hypoxia or hypercapnia: Secondary | ICD-10-CM | POA: Diagnosis not present

## 2016-06-19 DIAGNOSIS — I5033 Acute on chronic diastolic (congestive) heart failure: Secondary | ICD-10-CM | POA: Diagnosis not present

## 2016-06-19 MED ORDER — OXYCODONE HCL 10 MG PO TABS: 10.0000 mg | ORAL_TABLET | Freq: Four times a day (QID) | ORAL | Status: DC | PRN

## 2016-06-19 NOTE — Patient Instructions (Addendum)
PLEASE CALL ME WITH ANY PROBLEMS OR QUESTIONS [phone])   Supplements which can help your pain: TURMERIC, GINGER, TART CHERRY EXTRACT   YOU CAN TAKE UP TO 2000MG  OF TYLENOL DAILY FOR PAIN

## 2016-06-19 NOTE — Progress Notes (Signed)
Subjective:    Patient ID: Linda [name], female    DOB: 10-08-1946, 70 y.o.   MRN: [id number]  HPI   Linda [name] is here in follow up of her chronic pain. Her knee pain is worsening. She's had injections from ortho without much success. She is here in fact today with a wheelchair.  She still tries to walk around at home with her walker.  She was in the hospital earlier last month with CHF. She was diuresed and has been home since. She is a non-surgical candidate for her knees given her lung and heart conditions.   Depression continues to be an issue as it pertains to the loss of her husband.    Pain Inventory Average Pain 6 Pain Right Now 8 My pain is aching  In the last 24 hours, has pain interfered with the following? General activity 10 Relation with others 10 Enjoyment of life 10 What TIME of day is your pain at its worst? all times Sleep (in general) Fair  Pain is worse with: walking, bending, standing and some activites Pain improves with: rest, heat/ice and medication Relief from Meds: 5  Mobility use a walker how many minutes can you walk? 10 ability to climb steps?  no do you drive?  yes transfers alone Do you have any goals in this area?  yes  Function retired I need assistance with the following:  meal prep, household duties and shopping Do you have any goals in this area?  yes  Neuro/Psych bladder control problems weakness trouble walking spasms anxiety  Prior Studies Any changes since last visit?  no  Physicians involved in your care Any changes since last visit?  no   Family History  Problem Relation Age of Onset  . Arthritis Mother   . Kidney disease Mother   . Heart disease Father   . Stroke Father   . Hypertension Father   . Diabetes Father   . Heart attack Father   . Heart attack Paternal Grandmother    Social History   Social History  . Marital Status: Widowed    Spouse Name: N/A  . Number of Children: 2  . Years of  Education: N/A   Social History Main Topics  . Smoking status: Never Smoker   . Smokeless tobacco: Never Used     Comment: never used tobacco  . Alcohol Use: No  . Drug Use: No  . Sexual Activity: Not Currently   Other Topics Concern  . None   Social History Narrative   Widowed, 2 sons.  Relocated to [place] 09/2012 to be closer to her son who has MS.   Husband d 2015--mesothelioma.   Occupation: former Pharmacist, hospital.   Education: masters degree level.   No T/A/Ds.   Past Surgical History  Procedure Laterality Date  . Appendectomy  1960  . Total abdominal hysterectomy  1974  . Tendon release  1996    Right forearm and hand  . Knee surgery  2005  . Heel spur surgery Left 2008  . Plantar fascia release Left 2008  . Axillary surgery Left 1978    Multiple "lump" in armpit per pt  . Coccyx removal  1972  . Cardiac catheterization  01/2013    nonobstructive CAD, EF 55-60%  . Transthoracic echocardiogram  01/2013; 04/2014;08/2015    2014--NORMAL.  2015--focal basal septal hypertrophy, EF 55-60%, grade I diast dysfxn, mild LAE.  08/2015 EF 55-60%, nl LV syst fxn, grade I DD, valves wnl  .  Dilation and curettage of uterus  ? 1970's  . Eye surgery Left 2012-2013    "injections for ~ 1 yr; don't really know what for" (07/12/2013)  . Spirometry  04/25/14    In hosp for acute asthma/COPD flare: mixed obstructive and restrictive lung disease. The FEV1 is severely reduced at 45% predicted.  FEV1 signif decreased compared to prior spirometry 07/23/13.  [name] Kitchen Esophagogastroduodenoscopy N/A 07/19/2014    Gastritis found + in the setting of supratherapeutic INR, +plavix, + meloxicam.  . Left heart catheterization with coronary angiogram N/A 01/30/2013    Procedure: LEFT HEART CATHETERIZATION WITH CORONARY ANGIOGRAM;  Surgeon: Clent Demark, MD;  Location: [facility] CATH LAB;  Service: Cardiovascular;  Laterality: N/A;  . Cardiovascular stress test  02/22/15    Low risk myocard perf imaging; wall motion normal, normal EF    Past Medical History  Diagnosis Date  . HTN (hypertension)   . Depression   . Recurrent UTI     hx of hospitalization for pyelonephritis; started abx prophylaxis 06/2015  . Hay fever   . Mixed incontinence urge and stress   . Diverticular disease   . Insomnia   . Fibromyalgia     Patient states dx was around her late 31s but she had sx's for years prior to this.  . Syncope     Hypotensive; ED visit--Dr. Terrence Dupont did Cath--nonobstructive CAD, EF 55-60%.  In retrospect, suspect pt rx med misuse/polypharmacy  . Idiopathic angio-edema-urticaria 72014    Angioedema component was very minimal  . Asthma     w/ asbestososis   . History of pneumonia     hospitalized 12/2011, 02/2013, and 07/2013 [facility]) for this  . Anginal pain ([place])     Nonobstructive CAD 2014; however, her cardiologist put her on a statin for this and NOT for hyperlipidemia per pt report.  . OSA on CPAP     prior to move to [place]--had another sleep study 10/2015 w/pulm Dr. Camillo Flaming.  . H/O hiatal hernia   . Migraine syndrome     "not as often anymore; used to be ~ q wk" (07/12/2013)  . Tension headache, chronic   . DDD (degenerative disc disease)     lumbar and cervical.   . Osteoarthritis     "severe; progressing fast" (07/12/2013); multiple joints-not surgical candidate for TKR (03/2015)  . Chronic lower back pain   . Anxiety     panic attacks  . Nephrolithiasis     "passed all on my own or they are still in there" (07/12/2013)  . Pyelonephritis     "several times over the last 30 yr" (07/12/2013)  . Diastolic congestive heart failure ([place])   . COPD (chronic obstructive pulmonary disease) ([place])   . Pulmonary embolism ([place]) 07/2013    Dx at [facility] with very small peripheral upper lobe pe 07/2013: pt took coumadin for about 8-9 mo  . Pleural plaque with presence of asbestos 07/22/2013  . BPPV (benign paroxysmal positional vertigo) 12/16/2012  . RBBB (right bundle branch block)   . Acute upper GI bleed 06/2014    while pt taking  coumadin, plavix, and meloxicam---despite being told not to take coumadin.  . Iron deficiency anemia     Hematologist in [place], [place] did extensive w/u; no cause found; failed oral supplement;; gets fairly regular (q71m or so) IV iron infusions (Venofer -iron sucrose- 200mg  with procrit.  "for 14 yr I've been getting blood work q month & getting infusions prn" (07/12/2013).  Dr. Marin Olp locally,  iron infusions done, EPO deficiency dx'd  . Pernicious anemia 08/24/2014   There were no vitals taken for this visit.  Opioid Risk Score:   Fall Risk Score:  `1  Depression screen PHQ 2/9  Depression screen [facility] 2/9 06/12/2016 04/19/2016 08/11/2015 07/12/2015 03/27/2015 03/31/2014  Decreased Interest 1 0 3 0 3 0  Down, Depressed, Hopeless 0 0 3 1 3  0  PHQ - 2 Score 1 0 6 1 6  0  Altered sleeping - - 2 - 2 -  Tired, decreased energy - - 3 - 3 -  Change in appetite - - 0 - 2 -  Feeling bad or failure about yourself  - - 2 - 2 -  Trouble concentrating - - 0 - 2 -  Moving slowly or fidgety/restless - - 0 - 0 -  Suicidal thoughts - - 0 - 0 -  PHQ-9 Score - - 13 - 17 -     Review of Systems  HENT: Negative.   Respiratory: Negative.   Cardiovascular: Negative.   Gastrointestinal: Negative.   Endocrine: Negative.   Genitourinary: Negative.   Musculoskeletal: Positive for back pain.  Skin: Negative.   Allergic/Immunologic: Negative.   Neurological: Positive for weakness.  Hematological: Negative.   Psychiatric/Behavioral: Negative.        Objective:   Physical Exam Constitutional: she is in a w/c. Portable oxygen connected at 2L.  HENT: oral mucosa pink, dentition good  Head: Normocephalic and atraumatic.  Eyes: Conjunctivae are normal. Pupils are equal, round, and reactive to light.  Neck: Normal range of motion. Neck supple.  Cardiovascular: Normal rate and regular rhythm. No murmurs  Respiratory: Effort normal and breath sounds normal. No respiratory distress. She has no wheezes.  GI: Soft.  Bowel sounds are normal. She exhibits no distension. There is no tenderness.  Neurological: She is alert and oriented to person, place, and time. Mild sensory loss over right foot and ulnar hand is persistent---otherwise normal sensory exam. Strength grossly 4+/5 in bilateral UE's. LE's 4/5 HF and Knees, 4/5 ankles.. Normal rom and fmc. Cognitively displays good insight and awareness.  Skin: Skin is warm and dry.  M/S: substantial pain in right knee and left. Right shoulder with rotator cuff impingement signs. Has pain with abduction past 50 degrees. There is mild anterior pain also in the shoulder along biceps tendon. Point tenderness along subacromial space. Edema 1+ LLE trace RLE. Left calf and tibial area tender. No warmth.  Psychiatric: Flat, more interactive.   Assessment/Plan:  1. Functional deficits secondary to Gait disorder due to multiple medical and psychosocial issues--- .  2. Low back pain/fibromyalgia/R>L Knee OA  -change to oxycodone IR 10mg  for breakthrough, one q6 prn -may use additional tylenol for pain relief up to 2000mg  daily -reviewed supplements including tart cherrry, turmeric, ginger 3. Right shoulder pain, likely rotator cuff injury after fall. Xray of chest from last week doesn't reveal a fracture. early adhesive capsulitis.   -needs to maintain right shoulder ROM  4. Depression with anxiety/Grief reaction/Mood: Trazodone/cymbalta .  -this continues to be a big problem-- .  5. [name] with asthma: Oxygen dependent. Albuterol prn.    Thirty minutes of face to face patient care time were spent during this visit. All questions were encouraged and answered. Follow up in about a month.

## 2016-06-20 ENCOUNTER — Ambulatory Visit: Payer: Medicare Other | Admitting: Physician Assistant

## 2016-06-20 ENCOUNTER — Ambulatory Visit (INDEPENDENT_AMBULATORY_CARE_PROVIDER_SITE_OTHER): Payer: Medicare Other | Admitting: Physician Assistant

## 2016-06-20 ENCOUNTER — Encounter: Payer: Self-pay | Admitting: Physician Assistant

## 2016-06-20 VITALS — BP 120/68 | HR 94 | Ht 64.0 in | Wt 175.8 lb

## 2016-06-20 DIAGNOSIS — Z79899 Other long term (current) drug therapy: Secondary | ICD-10-CM

## 2016-06-20 DIAGNOSIS — R079 Chest pain, unspecified: Secondary | ICD-10-CM

## 2016-06-20 DIAGNOSIS — I2510 Atherosclerotic heart disease of native coronary artery without angina pectoris: Secondary | ICD-10-CM

## 2016-06-20 DIAGNOSIS — I5032 Chronic diastolic (congestive) heart failure: Secondary | ICD-10-CM | POA: Diagnosis not present

## 2016-06-20 LAB — BASIC METABOLIC PANEL
BUN: 16 mg/dL (ref 7–25)
CALCIUM: 9.5 mg/dL (ref 8.6–10.4)
CO2: 26 mmol/L (ref 20–31)
CREATININE: 0.93 mg/dL (ref 0.60–0.93)
Chloride: 101 mmol/L (ref 98–110)
GLUCOSE: 97 mg/dL (ref 65–99)
Potassium: 4.3 mmol/L (ref 3.5–5.3)
Sodium: 141 mmol/L (ref 135–146)

## 2016-06-20 NOTE — Patient Instructions (Signed)
Medication Instructions: Rosaria Ferries, PA-C has recommended making the following medication changes: 1. INCREASE Furosemide to 40 mg TWICE DAILY for 1 week  Labwork: Your physician recommends that you return for lab work TODAY and again in 1 week prior to office visit.  Testing/Procedures: NONE ORDERED  Follow-up: Suanne Marker recommends that you schedule a follow-up appointment on Thursday, 06/27/2016. Okay to double book in the morning.  If you need a refill on your cardiac medications before your next appointment, please call your pharmacy.

## 2016-06-20 NOTE — Progress Notes (Signed)
Cardiology Office Note   Date:  06/20/2016   ID:  Linda [name], DOB Aug 04, 1946, MRN [id number]  PCP:  Tammi Sou, MD  Cardiologist:  Dr Alcide Evener, PA-C   Chief Complaint  Patient presents with  . Fatigue    History of Present Illness: Linda [name] is a 70 y.o. female with a history of COPD on O2 3 lpm, HTN, ?orthostatic syncope, D-CHF, mod CAD by cath 2014 w/ med rx, fibromyalgia w/ chronic pain issues.  D/C to [facility] 06/15 after admit for CHF, Echo done on 06/02/16 showed EF 60-65% with grade 1 diastolic dysfunction (no MR noted).   D/C home from [facility] 06/23  Linda [name] presents for Follow-up after her hospital stay.  Since she has gone home from the hospital, her weight is up, about 4 pounds in the last 2 days. She feels that she is a little short of breath with minimal activity. She has some lower extremity edema. She describes PND and possibly orthopnea. She is already on oxygen but really feels it when the oxygen is not working properly.  She has also been getting chest tightness with and without exertion. There will recheck 45/10. She has occasional nausea and diaphoresis as well. Currently she is not having any chest tightness and her shortness of breath improved once her oxygen was restarted.  She has been working with physical therapy at home. She is compliant with her Lasix at 40 mg daily.    Past Medical History  Diagnosis Date  . HTN (hypertension)   . Depression   . Recurrent UTI     hx of hospitalization for pyelonephritis; started abx prophylaxis 06/2015  . Hay fever   . Mixed incontinence urge and stress   . Diverticular disease   . Insomnia   . Fibromyalgia     Patient states dx was around her late 66s but she had sx's for years prior to this.  . Syncope     Hypotensive; ED visit--Dr. Terrence Dupont did Cath--nonobstructive CAD, EF 55-60%.  In retrospect, suspect pt rx med misuse/polypharmacy  . Idiopathic  angio-edema-urticaria 72014    Angioedema component was very minimal  . Asthma     w/ asbestososis   . History of pneumonia     hospitalized 12/2011, 02/2013, and 07/2013 [facility]) for this  . Anginal pain ([place])     Nonobstructive CAD 2014; however, her cardiologist put her on a statin for this and NOT for hyperlipidemia per pt report.  . OSA on CPAP     prior to move to [place]--had another sleep study 10/2015 w/pulm Dr. Camillo Flaming.  . H/O hiatal hernia   . Migraine syndrome     "not as often anymore; used to be ~ q wk" (07/12/2013)  . Tension headache, chronic   . DDD (degenerative disc disease)     lumbar and cervical.   . Osteoarthritis     "severe; progressing fast" (07/12/2013); multiple joints-not surgical candidate for TKR (03/2015)  . Chronic lower back pain   . Anxiety     panic attacks  . Nephrolithiasis     "passed all on my own or they are still in there" (07/12/2013)  . Pyelonephritis     "several times over the last 30 yr" (07/12/2013)  . Diastolic congestive heart failure ([place])   . COPD (chronic obstructive pulmonary disease) ([place])   . Pulmonary embolism ([place]) 07/2013    Dx at [facility] with very small peripheral upper lobe  pe 07/2013: pt took coumadin for about 8-9 mo  . Pleural plaque with presence of asbestos 07/22/2013  . BPPV (benign paroxysmal positional vertigo) 12/16/2012  . RBBB (right bundle branch block)   . Acute upper GI bleed 06/2014    while pt taking coumadin, plavix, and meloxicam---despite being told not to take coumadin.  . Iron deficiency anemia     Hematologist in [place], [place] did extensive w/u; no cause found; failed oral supplement;; gets fairly regular (q46m or so) IV iron infusions (Venofer -iron sucrose- 200mg  with procrit.  "for 14 yr I've been getting blood work q month & getting infusions prn" (07/12/2013).  Dr. Marin Olp locally, iron infusions done, EPO deficiency dx'd  . Pernicious anemia 08/24/2014    Past Surgical History  Procedure Laterality Date  .  Appendectomy  1960  . Total abdominal hysterectomy  1974  . Tendon release  1996    Right forearm and hand  . Knee surgery  2005  . Heel spur surgery Left 2008  . Plantar fascia release Left 2008  . Axillary surgery Left 1978    Multiple "lump" in armpit per pt  . Coccyx removal  1972  . Cardiac catheterization  01/2013    nonobstructive CAD, EF 55-60%  . Transthoracic echocardiogram  01/2013; 04/2014;08/2015    2014--NORMAL.  2015--focal basal septal hypertrophy, EF 55-60%, grade I diast dysfxn, mild LAE.  08/2015 EF 55-60%, nl LV syst fxn, grade I DD, valves wnl  . Dilation and curettage of uterus  ? 1970's  . Eye surgery Left 2012-2013    "injections for ~ 1 yr; don't really know what for" (07/12/2013)  . Spirometry  04/25/14    In hosp for acute asthma/COPD flare: mixed obstructive and restrictive lung disease. The FEV1 is severely reduced at 45% predicted.  FEV1 signif decreased compared to prior spirometry 07/23/13.  [name] Kitchen Esophagogastroduodenoscopy N/A 07/19/2014    Gastritis found + in the setting of supratherapeutic INR, +plavix, + meloxicam.  . Left heart catheterization with coronary angiogram N/A 01/30/2013    Procedure: LEFT HEART CATHETERIZATION WITH CORONARY ANGIOGRAM;  Surgeon: Clent Demark, MD;  Location: [facility] CATH LAB;  Service: Cardiovascular;  Laterality: N/A;  . Cardiovascular stress test  02/22/15    Low risk myocard perf imaging; wall motion normal, normal EF    Current Outpatient Prescriptions  Medication Sig Dispense Refill  . acetaminophen (TYLENOL) 325 MG tablet Take 2 tablets (650 mg total) by mouth every 4 (four) hours as needed for headache or mild pain.    [name] Kitchen albuterol (PROVENTIL HFA;VENTOLIN HFA) 108 (90 BASE) MCG/ACT inhaler Inhale 2 puffs into the lungs every 6 (six) hours as needed for wheezing or shortness of breath. 1 Inhaler 1  . ALPRAZolam (XANAX) 1 MG tablet Take 1 mg by mouth 3 (three) times daily as needed for anxiety.    . ARIPiprazole (ABILIFY) 10 MG tablet  Take 1 tablet (10 mg total) by mouth daily. 30 tablet 2  . aspirin 81 MG tablet Take 81 mg by mouth at bedtime.    [name] Kitchen atorvastatin (LIPITOR) 20 MG tablet Take 20 mg by mouth at bedtime.    . budesonide-formoterol (SYMBICORT) 160-4.5 MCG/ACT inhaler Inhale 2 puffs into the lungs 2 (two) times daily. 1 Inhaler 12  . Cholecalciferol (VITAMIN D3) 50000 units CAPS Take 1 capsule by mouth every Monday.     . diclofenac sodium (VOLTAREN) 1 % GEL Apply 1 application topically 3 (three) times daily as needed (pain).     [name] Kitchen  DULoxetine (CYMBALTA) 60 MG capsule Take 1 capsule (60 mg total) by mouth at bedtime.  3  . folic acid (FOLVITE) 1 MG tablet Take 2 tablets (2 mg total) by mouth daily. 60 tablet 6  . furosemide (LASIX) 40 MG tablet Take 40 mg by mouth daily.    [name] Kitchen ibuprofen (ADVIL,MOTRIN) 800 MG tablet Take 800 mg by mouth daily as needed (pain).    . isosorbide mononitrate (IMDUR) 30 MG 24 hr tablet Take 30 mg by mouth daily.    . Multiple Vitamin (MULTIVITAMIN WITH MINERALS) TABS tablet Take 1 tablet by mouth daily.    . naphazoline-pheniramine (NAPHCON-A) 0.025-0.3 % ophthalmic solution Place 1 drop into both eyes 4 (four) times daily as needed for irritation.    . nebivolol (BYSTOLIC) 5 MG tablet Take 5 mg by mouth daily.    . nitroGLYCERIN (NITROSTAT) 0.4 MG SL tablet Place 0.4 mg under the tongue every 5 (five) minutes as needed for chest pain (x 3 doses).     . ondansetron (ZOFRAN-ODT) 4 MG disintegrating tablet Take 4 mg by mouth every 8 (eight) hours as needed for nausea or vomiting.    . Oxycodone HCl 10 MG TABS Take 1 tablet (10 mg total) by mouth every 6 (six) hours as needed (pain). 100 tablet 0  . OXYGEN Inhale 3 L into the lungs continuous.    . pantoprazole (PROTONIX) 40 MG tablet Take 1 tablet (40 mg total) by mouth daily. 90 tablet 1  . polyethylene glycol (MIRALAX / GLYCOLAX) packet Take 17 g by mouth daily as needed (constipation). Mix in 8 oz liquid and drink    . potassium chloride  SA (K-DUR,KLOR-CON) 20 MEQ tablet Take 20-40 mEq by mouth 2 (two) times daily. Take 2 tablets (40 meq) by mouth every morning and 1 tablet (20 meq) at night    . pregabalin (LYRICA) 100 MG capsule Take 100 mg by mouth daily. Take QAM    . pregabalin (LYRICA) 300 MG capsule Take 300 mg by mouth at bedtime.     . traZODone (DESYREL) 50 MG tablet Take 50 mg by mouth at bedtime. INSOMNIA. Patient taking 2-4 tablets at bedtime .     No current facility-administered medications for this visit.   Facility-Administered Medications Ordered in Other Visits  Medication Dose Route Frequency Provider Last Rate Last Dose  . ferumoxytol (FERAHEME) 510 mg in sodium chloride 0.9 % 100 mL IVPB  510 mg Intravenous Once Volanda Napoleon, MD        Allergies:   Penicillins    Social History:  The patient  reports that she has never smoked. She has never used smokeless tobacco. She reports that she does not drink alcohol or use illicit drugs.   Family History:  The patient's family history includes Arthritis in her mother; Diabetes in her father; Heart attack in her father and paternal grandmother; Heart disease in her father; Hypertension in her father; Kidney disease in her mother; Stroke in her father.    ROS:  Please see the history of present illness. All other systems are reviewed and negative.    PHYSICAL EXAM: VS:  BP 120/68 mmHg  Pulse 94  Ht 5\' 4"  (1.626 m)  Wt 175 lb 12.8 oz (79.742 kg)  BMI 30.16 kg/m2 , BMI Body mass index is 30.16 kg/(m^2). GEN: Well nourished, well developed, female in no acute distress HEENT: normal for age  Neck: JVD at 9 cm, no carotid bruit, no masses Cardiac: RRR; no murmur,  no rubs, or gallops Respiratory:  Decreased breath sounds bases bilaterally, chronically increased work of breathing GI: soft, nontender, nondistended, + BS MS: no deformity or atrophy; one-2+ edema; distal pulses are 2+ in all 4 extremities  Skin: warm and dry, no rash Neuro:  Strength and  sensation are intact Psych: euthymic mood, full affect   EKG:  EKG is ordered today. The ekg ordered today demonstrates normal sinus rhythm with a right bundle branch block which is old   Recent Labs: 08/24/2015: Pro B Natriuretic peptide (BNP) 92.0 04/08/2016: Magnesium 2.0; TSH 1.002 05/30/2016: ALT 15; B Natriuretic Peptide 87.9 06/02/2016: Hemoglobin 12.4; Platelets 194 06/05/2016: BUN 32*; Creatinine, Ser 1.25*; Potassium 4.7; Sodium 136    Lipid Panel    Component Value Date/Time   CHOL 152 01/31/2013 0500   TRIG 92 01/31/2013 0500   HDL 44 01/31/2013 0500   CHOLHDL 3.5 01/31/2013 0500   VLDL 18 01/31/2013 0500   LDLCALC 90 01/31/2013 0500     Wt Readings from Last 3 Encounters:  06/20/16 175 lb 12.8 oz (79.742 kg)  06/14/16 172 lb 9.6 oz (78.291 kg)  06/11/16 172 lb (78.019 kg)     Other studies Reviewed: Additional studies/ records that were reviewed today include: Office notes and hospital records.  ASSESSMENT AND PLAN:  1.  Acute on chronic diastolic CHF: Her weight is up 4 pounds in the last 48 hours. She has significant lower extremity edema and JVD on physical exam. We will increase her Lasix to 40 mg twice a day for a week. She is encouraged to continue other medications as prescribed and stick to a low sodium diet. We will check a BMET today. We will bring her back in a week for early follow-up and a repeat BMET.  2. Chest pain: She is not having consistent chest pain with exertion. Her symptoms may be coming from volume overload. For now, continue medical therapy with aspirin, nitrates, beta blocker and statin. Reassess her chest pain once she is diuresed to see if further testing is indicated.   Current medicines are reviewed at length with the patient today.  The patient does not have concerns regarding medicines.  The following changes have been made:  Increase Lasix for one week  Labs/ tests ordered today include: BMET now and BMET in one week     Disposition:   FU with Dr Stanford Breed  Signed, Rosaria Ferries, PA-C  06/20/2016 11:33 AM    [place] Phone: [phone]; Fax: [phone]  This note was written with the assistance of speech recognition software. Please excuse any transcriptional errors.

## 2016-06-21 DIAGNOSIS — F339 Major depressive disorder, recurrent, unspecified: Secondary | ICD-10-CM | POA: Diagnosis not present

## 2016-06-21 DIAGNOSIS — I110 Hypertensive heart disease with heart failure: Secondary | ICD-10-CM | POA: Diagnosis not present

## 2016-06-21 DIAGNOSIS — J61 Pneumoconiosis due to asbestos and other mineral fibers: Secondary | ICD-10-CM | POA: Diagnosis not present

## 2016-06-21 DIAGNOSIS — J9610 Chronic respiratory failure, unspecified whether with hypoxia or hypercapnia: Secondary | ICD-10-CM | POA: Diagnosis not present

## 2016-06-21 DIAGNOSIS — I5033 Acute on chronic diastolic (congestive) heart failure: Secondary | ICD-10-CM | POA: Diagnosis not present

## 2016-06-21 DIAGNOSIS — J449 Chronic obstructive pulmonary disease, unspecified: Secondary | ICD-10-CM | POA: Diagnosis not present

## 2016-06-24 DIAGNOSIS — J449 Chronic obstructive pulmonary disease, unspecified: Secondary | ICD-10-CM | POA: Diagnosis not present

## 2016-06-24 DIAGNOSIS — F339 Major depressive disorder, recurrent, unspecified: Secondary | ICD-10-CM | POA: Diagnosis not present

## 2016-06-24 DIAGNOSIS — I5033 Acute on chronic diastolic (congestive) heart failure: Secondary | ICD-10-CM | POA: Diagnosis not present

## 2016-06-24 DIAGNOSIS — I110 Hypertensive heart disease with heart failure: Secondary | ICD-10-CM | POA: Diagnosis not present

## 2016-06-24 DIAGNOSIS — J61 Pneumoconiosis due to asbestos and other mineral fibers: Secondary | ICD-10-CM | POA: Diagnosis not present

## 2016-06-24 DIAGNOSIS — J9610 Chronic respiratory failure, unspecified whether with hypoxia or hypercapnia: Secondary | ICD-10-CM | POA: Diagnosis not present

## 2016-06-26 ENCOUNTER — Telehealth: Payer: Self-pay | Admitting: Cardiology

## 2016-06-26 ENCOUNTER — Other Ambulatory Visit: Payer: Self-pay | Admitting: *Deleted

## 2016-06-26 ENCOUNTER — Other Ambulatory Visit: Payer: Self-pay | Admitting: Hematology & Oncology

## 2016-06-26 DIAGNOSIS — M170 Bilateral primary osteoarthritis of knee: Secondary | ICD-10-CM | POA: Diagnosis not present

## 2016-06-26 DIAGNOSIS — R262 Difficulty in walking, not elsewhere classified: Secondary | ICD-10-CM | POA: Diagnosis not present

## 2016-06-26 DIAGNOSIS — M25562 Pain in left knee: Secondary | ICD-10-CM | POA: Diagnosis not present

## 2016-06-26 DIAGNOSIS — M25561 Pain in right knee: Secondary | ICD-10-CM | POA: Diagnosis not present

## 2016-06-26 NOTE — Patient Outreach (Signed)
[facility]) Care Management  06/26/2016  BABARA BUFFALO [birth year] [id number]   [place] drove to [facility] today, [place] where patient was residing to receive short-term rehabilitative services, only to learn that patient had been discharged back home on 06/14/2016.  CSW was able to make phone contact with patient today to confirm that she was discharged home with home health services and durable medical equipment.  Patient denies having any additional social work needs at present.   CSW will perform a case closure on patient, as all goals of treatment have been met from social work standpoint and no additional social work needs have been identified at this time.  CSW will notify patient's RNCM with [facility], of CSW's plans to close patient's case.  CSW will fax an update to patient's Primary Care Physician, [name] to ensure that they are aware of CSW's involvement with patient's plan of care.  CSW will submit a case closure request to Verlon Setting , Care Management Assistant with [facility], in the form of an [employer].  CSW will ensure that Mrs. Comer is aware of [name], RNCM with [place] [facility], continued involvement with patient's care. [name], BSW, MSW, LCSW  Licensed Education officer, [facility]  Mailing [place] [address] [place], [place] [address] Physical Address-[address], [place], [place] [address] Toll Free Main # [phone] Fax # [phone] Cell # [phone]  Fax # [phone]  [email or web address]

## 2016-06-26 NOTE — Telephone Encounter (Signed)
New Message  Physical therapist calling to get verbal orders to continue PT- 2x week for 6 weeks. Please call back and discuss.

## 2016-06-26 NOTE — Telephone Encounter (Signed)
Returned call to [place] [facility] with [place] calling to get a order from [facility] for PT for activity tolerance,gate training,strenghting twice a week for 6 weeks.Message sent to [place].

## 2016-06-26 NOTE — Patient Outreach (Signed)
Covering for L. Matthews:  Noted per LCSW note, J. Saporito, and progress notes, member was discharged from facility on 6/23.  Call placed to initiate transition of care program, no answer.  HIPAA compliant voice message left.  Will await call back, if no call back will make second attempt to contact later this week.  Valente David, [place], MSN [place] [phone]

## 2016-06-27 ENCOUNTER — Telehealth: Payer: Self-pay | Admitting: Cardiology

## 2016-06-27 ENCOUNTER — Ambulatory Visit (INDEPENDENT_AMBULATORY_CARE_PROVIDER_SITE_OTHER): Payer: Medicare Other | Admitting: Physician Assistant

## 2016-06-27 ENCOUNTER — Encounter: Payer: Self-pay | Admitting: Physician Assistant

## 2016-06-27 VITALS — BP 118/68 | HR 75 | Ht 64.0 in | Wt 177.2 lb

## 2016-06-27 DIAGNOSIS — I2510 Atherosclerotic heart disease of native coronary artery without angina pectoris: Secondary | ICD-10-CM

## 2016-06-27 DIAGNOSIS — J449 Chronic obstructive pulmonary disease, unspecified: Secondary | ICD-10-CM | POA: Diagnosis not present

## 2016-06-27 DIAGNOSIS — F339 Major depressive disorder, recurrent, unspecified: Secondary | ICD-10-CM | POA: Diagnosis not present

## 2016-06-27 DIAGNOSIS — I10 Essential (primary) hypertension: Secondary | ICD-10-CM

## 2016-06-27 DIAGNOSIS — I5033 Acute on chronic diastolic (congestive) heart failure: Secondary | ICD-10-CM | POA: Diagnosis not present

## 2016-06-27 DIAGNOSIS — J61 Pneumoconiosis due to asbestos and other mineral fibers: Secondary | ICD-10-CM | POA: Diagnosis not present

## 2016-06-27 DIAGNOSIS — Z79899 Other long term (current) drug therapy: Secondary | ICD-10-CM | POA: Diagnosis not present

## 2016-06-27 DIAGNOSIS — J9610 Chronic respiratory failure, unspecified whether with hypoxia or hypercapnia: Secondary | ICD-10-CM | POA: Diagnosis not present

## 2016-06-27 DIAGNOSIS — I110 Hypertensive heart disease with heart failure: Secondary | ICD-10-CM | POA: Diagnosis not present

## 2016-06-27 MED ORDER — FUROSEMIDE 40 MG PO TABS: 40.0000 mg | ORAL_TABLET | Freq: Two times a day (BID) | ORAL | Status: DC

## 2016-06-27 MED ORDER — METOLAZONE 2.5 MG PO TABS: 2.5000 mg | ORAL_TABLET | ORAL | Status: DC

## 2016-06-27 NOTE — Telephone Encounter (Signed)
Spoke with gracee, verbal given for PT.

## 2016-06-27 NOTE — Progress Notes (Signed)
Cardiology Office Note   Date:  06/27/2016   ID:  Linda [name], DOB 30-Jun-1946, MRN [id number]  PCP:  Tammi Sou, MD  Cardiologist:  Dr Alcide Evener, PA-C   Chief Complaint  Patient presents with  . Follow-up    History of Present Illness: Linda [name] is a 70 y.o. female with a history of COPD on O2 3 lpm, HTN, ?orthostatic syncope, D-CHF, mod CAD by cath 2014 w/ med rx, fibromyalgia w/ chronic pain issues.   CHF admit 05/2016>>d/c to [facility] and then home. Seen in office 06/29 w/ D-CHF, early f/u planned  Linda [name] presents for CHF follow-up.  Since the last office visit, she feels that she is breathing a little bit better, but admits that she has not lost any weight. She is compliant with Lasix taking 40 mg twice a day. She has not had any loss cramping, weakness or dizziness. Her lower extremity edema is much worse in the afternoons, she is having minimal edema when she wakes up in the morning.  She is still having significant dyspnea on exertion and can't do much without getting very short of breath, even on oxygen. She also describes orthopnea but is not having PND. She is trying to avoid adding sodium to foods, but admits there may be sodium in the foods she is eating.   Past Medical History  Diagnosis Date  . HTN (hypertension)   . Depression   . Recurrent UTI     hx of hospitalization for pyelonephritis; started abx prophylaxis 06/2015  . Hay fever   . Mixed incontinence urge and stress   . Diverticular disease   . Insomnia   . Fibromyalgia     Patient states dx was around her late 79s but she had sx's for years prior to this.  . Syncope     Hypotensive; ED visit--Dr. Terrence Dupont did Cath--nonobstructive CAD, EF 55-60%.  In retrospect, suspect pt rx med misuse/polypharmacy  . Idiopathic angio-edema-urticaria 72014    Angioedema component was very minimal  . Asthma     w/ asbestososis   . History of pneumonia    hospitalized 12/2011, 02/2013, and 07/2013 [facility]) for this  . Anginal pain (Linda)     Nonobstructive CAD 2014; however, her cardiologist put her on a statin for this and NOT for hyperlipidemia per pt report.  . OSA on CPAP     prior to move to [place]--had another sleep study 10/2015 w/pulm Dr. Camillo Flaming.  . H/O hiatal hernia   . Migraine syndrome     "not as often anymore; used to be ~ q wk" (07/12/2013)  . Tension headache, chronic   . DDD (degenerative disc disease)     lumbar and cervical.   . Osteoarthritis     "severe; progressing fast" (07/12/2013); multiple joints-not surgical candidate for TKR (03/2015)  . Chronic lower back pain   . Anxiety     panic attacks  . Nephrolithiasis     "passed all on my own or they are still in there" (07/12/2013)  . Pyelonephritis     "several times over the last 30 yr" (07/12/2013)  . Diastolic congestive heart failure ([place])   . COPD (chronic obstructive pulmonary disease) ([place])   . Pulmonary embolism ([place]) 07/2013    Dx at [facility] with very small peripheral upper lobe pe 07/2013: pt took coumadin for about 8-9 mo  . Pleural plaque with presence of asbestos 07/22/2013  . BPPV (benign paroxysmal  positional vertigo) 12/16/2012  . RBBB (right bundle branch block)   . Acute upper GI bleed 06/2014    while pt taking coumadin, plavix, and meloxicam---despite being told not to take coumadin.  . Iron deficiency anemia     Hematologist in [place], [place] did extensive w/u; no cause found; failed oral supplement;; gets fairly regular (q70m or so) IV iron infusions (Venofer -iron sucrose- 200mg  with procrit.  "for 14 yr I've been getting blood work q month & getting infusions prn" (07/12/2013).  Dr. Marin Olp locally, iron infusions done, EPO deficiency dx'd  . Pernicious anemia 08/24/2014    Past Surgical History  Procedure Laterality Date  . Appendectomy  1960  . Total abdominal hysterectomy  1974  . Tendon release  1996    Right forearm and hand  . Knee surgery  2005  . Heel  spur surgery Left 2008  . Plantar fascia release Left 2008  . Axillary surgery Left 1978    Multiple "lump" in armpit per pt  . Coccyx removal  1972  . Cardiac catheterization  01/2013    nonobstructive CAD, EF 55-60%  . Transthoracic echocardiogram  01/2013; 04/2014;08/2015    2014--NORMAL.  2015--focal basal septal hypertrophy, EF 55-60%, grade I diast dysfxn, mild LAE.  08/2015 EF 55-60%, nl LV syst fxn, grade I DD, valves wnl  . Dilation and curettage of uterus  ? 1970's  . Eye surgery Left 2012-2013    "injections for ~ 1 yr; don't really know what for" (07/12/2013)  . Spirometry  04/25/14    In hosp for acute asthma/COPD flare: mixed obstructive and restrictive lung disease. The FEV1 is severely reduced at 45% predicted.  FEV1 signif decreased compared to prior spirometry 07/23/13.  [name] Kitchen Esophagogastroduodenoscopy N/A 07/19/2014    Gastritis found + in the setting of supratherapeutic INR, +plavix, + meloxicam.  . Left heart catheterization with coronary angiogram N/A 01/30/2013    Procedure: LEFT HEART CATHETERIZATION WITH CORONARY ANGIOGRAM;  Surgeon: Clent Demark, MD;  Location: [facility] CATH LAB;  Service: Cardiovascular;  Laterality: N/A;  . Cardiovascular stress test  02/22/15    Low risk myocard perf imaging; wall motion normal, normal EF    Current Outpatient Prescriptions  Medication Sig Dispense Refill  . acetaminophen (TYLENOL) 325 MG tablet Take 2 tablets (650 mg total) by mouth every 4 (four) hours as needed for headache or mild pain.    [name] Kitchen albuterol (PROVENTIL HFA;VENTOLIN HFA) 108 (90 BASE) MCG/ACT inhaler Inhale 2 puffs into the lungs every 6 (six) hours as needed for wheezing or shortness of breath. 1 Inhaler 1  . ALPRAZolam (XANAX) 1 MG tablet Take 1 mg by mouth 3 (three) times daily as needed for anxiety.    . ARIPiprazole (ABILIFY) 10 MG tablet Take 1 tablet (10 mg total) by mouth daily. 30 tablet 2  . aspirin 81 MG tablet Take 81 mg by mouth at bedtime.    [name] Kitchen atorvastatin (LIPITOR) 20  MG tablet Take 20 mg by mouth at bedtime.    . budesonide-formoterol (SYMBICORT) 160-4.5 MCG/ACT inhaler Inhale 2 puffs into the lungs 2 (two) times daily. 1 Inhaler 12  . Cholecalciferol (VITAMIN D3) 50000 units CAPS Take 1 capsule by mouth every Monday.     . diclofenac sodium (VOLTAREN) 1 % GEL Apply 1 application topically 3 (three) times daily as needed (pain).     . DULoxetine (CYMBALTA) 60 MG capsule Take 1 capsule (60 mg total) by mouth at bedtime.  3  . folic acid (  FOLVITE) 1 MG tablet Take 2 tablets (2 mg total) by mouth daily. 60 tablet 6  . furosemide (LASIX) 40 MG tablet Take 80 mg by mouth daily.     [name] Kitchen ibuprofen (ADVIL,MOTRIN) 800 MG tablet Take 800 mg by mouth daily as needed (pain).    . isosorbide mononitrate (IMDUR) 30 MG 24 hr tablet Take 30 mg by mouth daily.    [name] Kitchen LYRICA 300 MG capsule TAKE (1) CAPSULE TWICE DAILY. 60 capsule 0  . Multiple Vitamin (MULTIVITAMIN WITH MINERALS) TABS tablet Take 1 tablet by mouth daily.    . naphazoline-pheniramine (NAPHCON-A) 0.025-0.3 % ophthalmic solution Place 1 drop into both eyes 4 (four) times daily as needed for irritation.    . nebivolol (BYSTOLIC) 5 MG tablet Take 5 mg by mouth daily.    . nitroGLYCERIN (NITROSTAT) 0.4 MG SL tablet Place 0.4 mg under the tongue every 5 (five) minutes as needed for chest pain (x 3 doses).     . ondansetron (ZOFRAN-ODT) 4 MG disintegrating tablet Take 4 mg by mouth every 8 (eight) hours as needed for nausea or vomiting.    . Oxycodone HCl 20 MG TABS Take 1 tablet by mouth every 6 (six) hours.    . OXYGEN Inhale 3 L into the lungs continuous.    . pantoprazole (PROTONIX) 40 MG tablet Take 1 tablet (40 mg total) by mouth daily. 90 tablet 1  . polyethylene glycol (MIRALAX / GLYCOLAX) packet Take 17 g by mouth daily as needed (constipation). Mix in 8 oz liquid and drink    . potassium chloride SA (K-DUR,KLOR-CON) 20 MEQ tablet Take 20-40 mEq by mouth 2 (two) times daily. Take 2 tablets (40 meq) by mouth every  morning and 1 tablet (20 meq) at night    . pregabalin (LYRICA) 100 MG capsule Take 100 mg by mouth daily. Take QAM    . traZODone (DESYREL) 50 MG tablet Take 50 mg by mouth at bedtime. INSOMNIA. Patient taking 2-4 tablets at bedtime .     No current facility-administered medications for this visit.   Facility-Administered Medications Ordered in Other Visits  Medication Dose Route Frequency Provider Last Rate Last Dose  . ferumoxytol (FERAHEME) 510 mg in sodium chloride 0.9 % 100 mL IVPB  510 mg Intravenous Once Volanda Napoleon, MD        Allergies:   Penicillins    Social History:  The patient  reports that she has never smoked. She has never used smokeless tobacco. She reports that she does not drink alcohol or use illicit drugs.   Family History:  The patient's family history includes Arthritis in her mother; Diabetes in her father; Heart attack in her father and paternal grandmother; Heart disease in her father; Hypertension in her father; Kidney disease in her mother; Stroke in her father.    ROS:  Please see the history of present illness. All other systems are reviewed and negative.    PHYSICAL EXAM: VS:  BP 118/68 mmHg  Pulse 75  Ht 5\' 4"  (1.626 m)  Wt 177 lb 3.2 oz (80.377 kg)  BMI 30.40 kg/m2  SpO2 98% , BMI Body mass index is 30.4 kg/(m^2). GEN: Well nourished, well developed, female in no acute distress HEENT: normal for age  Neck: JVD 8 cm, +HJR, no carotid bruit, no masses Cardiac: RRR; no murmur, no rubs, or gallops Respiratory: decreased BS bases bilaterally, normal work of breathing GI: soft, nontender, nondistended, + BS MS: no deformity or atrophy; 1+ bilateral LE  edema; distal pulses are 2+ in all 4 extremities  Skin: warm and dry, no rash Neuro:  Strength and sensation are intact Psych: euthymic mood, full affect   EKG:  EKG is not ordered today.  Recent Labs: 08/24/2015: Pro B Natriuretic peptide (BNP) 92.0 04/08/2016: Magnesium 2.0; TSH  1.002 05/30/2016: ALT 15; B Natriuretic Peptide 87.9 06/02/2016: Hemoglobin 12.4; Platelets 194 06/20/2016: BUN 16; Creat 0.93; Potassium 4.3; Sodium 141    Lipid Panel    Component Value Date/Time   CHOL 152 01/31/2013 0500   TRIG 92 01/31/2013 0500   HDL 44 01/31/2013 0500   CHOLHDL 3.5 01/31/2013 0500   VLDL 18 01/31/2013 0500   LDLCALC 90 01/31/2013 0500     Wt Readings from Last 3 Encounters:  06/27/16 177 lb 3.2 oz (80.377 kg)  06/20/16 175 lb 12.8 oz (79.742 kg)  06/14/16 172 lb 9.6 oz (78.291 kg)     Other studies Reviewed: Additional studies/ records that were reviewed today include: Previous office notes, hospital records and testing.  ASSESSMENT AND PLAN:  1.  Acute on chronic diastolic CHF: Her weight is trending up, but her volume status seems a little bit better on the higher dose of Lasix.  She needs a little more diuresis. She is very reluctant to continue taking the increased dose of Lasix as it makes her urinate too much. Because of her respiratory status, it is hard for her to go back and forth to the bathroom multiple times in an hour. She wonders if week do this with the night or 2 of an inpatient stay, as that I did not feel her volume status was bad enough to warrant this.  We will add Zaroxolyn 2.5 mg to her medication list. She is to take this tomorrow, and then once a week as needed or edema or weight gain. We will check a BMET today, and then a BMET in a month. She is to take an additional potassium tablet every time she takes the Zaroxolyn.  2. Hypertension: Her blood pressure is well controlled on current medical therapy. We will have to watch her blood pressure carefully on the Zaroxolyn, but that she will only be taking it once a week, I believe she will tolerate it.   Current medicines are reviewed at length with the patient today.  The patient does not have concerns regarding medicines.  The following changes have been made:  Add Zaroxolyn and  increased potassium  Labs/ tests ordered today include:   Orders Placed This Encounter  Procedures  . Basic metabolic panel  . Basic metabolic panel     Disposition:   FU with Dr. Stanford Breed  Signed, Rosaria Ferries, PA-C  06/27/2016 3:27 PM    [place] Phone: [phone]; Fax: [phone]  This note was written with the assistance of speech recognition software. Please excuse any transcriptional errors.

## 2016-06-27 NOTE — Telephone Encounter (Signed)
New message     The physical therapist needs a verbal order for occupational therapy at home and frequencey  1 time a for 1 week and 2 times a week for 4 weeks

## 2016-06-27 NOTE — Patient Instructions (Signed)
Your physician has recommended you make the following change in your medication...  1. START metolazone 2.5mg  once a week  -- on the day you take metolazone - take potassium 2 tablets by mouth twice daily  Your physician recommends that you weigh, daily, at the same time every day, and in the same amount of clothing. Please record your daily weights on the handout provided and bring it to your next appointment.  Your physician recommends that you return for lab work TODAY and then in [place] (prior to next appointment)  Your physician recommends that you schedule a follow-up appointment in [place] with Dr. Stanford Breed or [facility] [place]

## 2016-06-27 NOTE — Telephone Encounter (Signed)
Called, no answer. Patient to see Suanne Marker today at 10:30 for OV. Will route for advice.

## 2016-06-27 NOTE — Telephone Encounter (Signed)
[place] for pt [employer]

## 2016-06-28 ENCOUNTER — Telehealth: Payer: Self-pay | Admitting: *Deleted

## 2016-06-28 ENCOUNTER — Encounter: Payer: Self-pay | Admitting: *Deleted

## 2016-06-28 ENCOUNTER — Other Ambulatory Visit: Payer: Self-pay | Admitting: *Deleted

## 2016-06-28 DIAGNOSIS — F339 Major depressive disorder, recurrent, unspecified: Secondary | ICD-10-CM | POA: Diagnosis not present

## 2016-06-28 DIAGNOSIS — J449 Chronic obstructive pulmonary disease, unspecified: Secondary | ICD-10-CM | POA: Diagnosis not present

## 2016-06-28 DIAGNOSIS — N289 Disorder of kidney and ureter, unspecified: Secondary | ICD-10-CM

## 2016-06-28 DIAGNOSIS — J61 Pneumoconiosis due to asbestos and other mineral fibers: Secondary | ICD-10-CM | POA: Diagnosis not present

## 2016-06-28 DIAGNOSIS — I5033 Acute on chronic diastolic (congestive) heart failure: Secondary | ICD-10-CM | POA: Diagnosis not present

## 2016-06-28 DIAGNOSIS — I110 Hypertensive heart disease with heart failure: Secondary | ICD-10-CM | POA: Diagnosis not present

## 2016-06-28 DIAGNOSIS — J9610 Chronic respiratory failure, unspecified whether with hypoxia or hypercapnia: Secondary | ICD-10-CM | POA: Diagnosis not present

## 2016-06-28 NOTE — Patient Outreach (Signed)
Call placed to member to initiate transition of care program.  This care manager introduced self and purpose of call, explained [facility] care management services.  She state she is "doing well" and denies any needs at this time.  She state that she had her follow up appointment with the cardiologist and is does not have to present back until next month.  She state she has home health nursing, PT and OT.  She is declining [facility] services at this time, but is receptive of contact information to use in the future if needed.  Will notify assigned care manager, care management assistant, and PCP of case closure.  Valente David, [place], MSN [place] [phone]

## 2016-06-28 NOTE — Telephone Encounter (Addendum)
Spoke with pt, she voiced understanding of medication changes and repeated them back to me. Lab orders placed. Pt will let me know what day she will be coming for lab work so I can weigh her.

## 2016-06-28 NOTE — Telephone Encounter (Signed)
-----   Message from Lonn Georgia, PA-C sent at 06/28/2016  4:18 PM EDT ----- Please ask her to hold her Lasix for 2 days, then restart at previous dose of 40 mg qd. Stop the Zaroxolyn (she has only taken 1 dose at most). Recheck BMET in 1 week. Can just do lab or get RN visit with weight and BP check also. RN visit with weight would be ideal, but may not happen. Much easier for her to come here, would not put her at [facility]. Thanks

## 2016-06-29 NOTE — Telephone Encounter (Signed)
I am happy to sign that but will not be in the office next week. Can you take a verbal? Thanks

## 2016-07-01 ENCOUNTER — Telehealth: Payer: Self-pay | Admitting: Physician Assistant

## 2016-07-01 DIAGNOSIS — F339 Major depressive disorder, recurrent, unspecified: Secondary | ICD-10-CM | POA: Diagnosis not present

## 2016-07-01 DIAGNOSIS — I110 Hypertensive heart disease with heart failure: Secondary | ICD-10-CM | POA: Diagnosis not present

## 2016-07-01 DIAGNOSIS — J61 Pneumoconiosis due to asbestos and other mineral fibers: Secondary | ICD-10-CM | POA: Diagnosis not present

## 2016-07-01 DIAGNOSIS — M1711 Unilateral primary osteoarthritis, right knee: Secondary | ICD-10-CM | POA: Diagnosis not present

## 2016-07-01 DIAGNOSIS — I5033 Acute on chronic diastolic (congestive) heart failure: Secondary | ICD-10-CM | POA: Diagnosis not present

## 2016-07-01 DIAGNOSIS — J9610 Chronic respiratory failure, unspecified whether with hypoxia or hypercapnia: Secondary | ICD-10-CM | POA: Diagnosis not present

## 2016-07-01 DIAGNOSIS — J449 Chronic obstructive pulmonary disease, unspecified: Secondary | ICD-10-CM | POA: Diagnosis not present

## 2016-07-01 DIAGNOSIS — M25561 Pain in right knee: Secondary | ICD-10-CM | POA: Diagnosis not present

## 2016-07-01 LAB — BASIC METABOLIC PANEL
BUN: 15 mg/dL (ref 7–25)
CO2: 28 mmol/L (ref 20–31)
Calcium: 9.4 mg/dL (ref 8.6–10.4)
Chloride: 101 mmol/L (ref 98–110)
Creat: 0.93 mg/dL (ref 0.60–0.93)
Glucose, Bld: 92 mg/dL (ref 65–99)
POTASSIUM: 3.5 mmol/L (ref 3.5–5.3)
Sodium: 143 mmol/L (ref 135–146)

## 2016-07-01 NOTE — Telephone Encounter (Signed)
New message      Talk to a nurse to give lab report

## 2016-07-01 NOTE — Telephone Encounter (Signed)
No answer. Left message to call back.   

## 2016-07-01 NOTE — Telephone Encounter (Signed)
Follow up   Linda [name] is returning all because she has not heard back pertaining to the verbal order

## 2016-07-01 NOTE — Telephone Encounter (Signed)
Creatinine amended to 0.93 per [facility] lab staff  There was an instrument issue when processing initial labs  A follow up report will be sent over  Message routed to R. Barrett, PA and primary cardiologist Dr. Stanford Breed for advice

## 2016-07-01 NOTE — Telephone Encounter (Signed)
Verbal order given  

## 2016-07-01 NOTE — Telephone Encounter (Signed)
Routing to Dr Stanford Breed for approval of verbal order for occupational therapy at home for patient.  Please advise.

## 2016-07-01 NOTE — Telephone Encounter (Signed)
Ok for order Deedee Lybarger  

## 2016-07-03 ENCOUNTER — Telehealth: Payer: Self-pay | Admitting: Cardiology

## 2016-07-03 DIAGNOSIS — M25562 Pain in left knee: Secondary | ICD-10-CM | POA: Diagnosis not present

## 2016-07-03 DIAGNOSIS — J449 Chronic obstructive pulmonary disease, unspecified: Secondary | ICD-10-CM | POA: Diagnosis not present

## 2016-07-03 DIAGNOSIS — J9610 Chronic respiratory failure, unspecified whether with hypoxia or hypercapnia: Secondary | ICD-10-CM | POA: Diagnosis not present

## 2016-07-03 DIAGNOSIS — I110 Hypertensive heart disease with heart failure: Secondary | ICD-10-CM | POA: Diagnosis not present

## 2016-07-03 DIAGNOSIS — I5033 Acute on chronic diastolic (congestive) heart failure: Secondary | ICD-10-CM | POA: Diagnosis not present

## 2016-07-03 DIAGNOSIS — M1712 Unilateral primary osteoarthritis, left knee: Secondary | ICD-10-CM | POA: Diagnosis not present

## 2016-07-03 DIAGNOSIS — F339 Major depressive disorder, recurrent, unspecified: Secondary | ICD-10-CM | POA: Diagnosis not present

## 2016-07-03 DIAGNOSIS — J61 Pneumoconiosis due to asbestos and other mineral fibers: Secondary | ICD-10-CM | POA: Diagnosis not present

## 2016-07-03 NOTE — Telephone Encounter (Signed)
Pt can fu with pa Linda [name]

## 2016-07-03 NOTE — Telephone Encounter (Signed)
New Message  OT w/ [facility] called to report 9 lb weight loss over the weekend- wanted RN to follow up w/ pt. Please call back and discuss.

## 2016-07-03 NOTE — Telephone Encounter (Signed)
Spoke with patient regarding recent weight loss States she is feeling washed out, no energy and hard for her to do anything.  This am was hard for her get dress and feed herself Did hold her Lasix for 2 days, decreased to 40 mg daily, and stopped Zaroxolyn as advised 06/28/16 Swelling better but still has shortness of breath just up walking around.  Compared to visit last week breathing about the same but fatigue worse She is supposed to come to office for weight this week and labs downstairs Will forward to Dr Stanford Breed for review

## 2016-07-03 NOTE — Telephone Encounter (Signed)
Spoke with pt, no change in medications at this time. She has a follow up appt with pa 07-23-16. Encouraged pt to follow up with PCP.

## 2016-07-05 DIAGNOSIS — F339 Major depressive disorder, recurrent, unspecified: Secondary | ICD-10-CM | POA: Diagnosis not present

## 2016-07-05 DIAGNOSIS — J9610 Chronic respiratory failure, unspecified whether with hypoxia or hypercapnia: Secondary | ICD-10-CM | POA: Diagnosis not present

## 2016-07-05 DIAGNOSIS — J449 Chronic obstructive pulmonary disease, unspecified: Secondary | ICD-10-CM | POA: Diagnosis not present

## 2016-07-05 DIAGNOSIS — J61 Pneumoconiosis due to asbestos and other mineral fibers: Secondary | ICD-10-CM | POA: Diagnosis not present

## 2016-07-05 DIAGNOSIS — I110 Hypertensive heart disease with heart failure: Secondary | ICD-10-CM | POA: Diagnosis not present

## 2016-07-05 DIAGNOSIS — I5033 Acute on chronic diastolic (congestive) heart failure: Secondary | ICD-10-CM | POA: Diagnosis not present

## 2016-07-08 DIAGNOSIS — J449 Chronic obstructive pulmonary disease, unspecified: Secondary | ICD-10-CM | POA: Diagnosis not present

## 2016-07-08 DIAGNOSIS — I110 Hypertensive heart disease with heart failure: Secondary | ICD-10-CM | POA: Diagnosis not present

## 2016-07-08 DIAGNOSIS — M1711 Unilateral primary osteoarthritis, right knee: Secondary | ICD-10-CM | POA: Diagnosis not present

## 2016-07-08 DIAGNOSIS — J61 Pneumoconiosis due to asbestos and other mineral fibers: Secondary | ICD-10-CM | POA: Diagnosis not present

## 2016-07-08 DIAGNOSIS — F339 Major depressive disorder, recurrent, unspecified: Secondary | ICD-10-CM | POA: Diagnosis not present

## 2016-07-08 DIAGNOSIS — I5033 Acute on chronic diastolic (congestive) heart failure: Secondary | ICD-10-CM | POA: Diagnosis not present

## 2016-07-08 DIAGNOSIS — M25561 Pain in right knee: Secondary | ICD-10-CM | POA: Diagnosis not present

## 2016-07-08 DIAGNOSIS — J9610 Chronic respiratory failure, unspecified whether with hypoxia or hypercapnia: Secondary | ICD-10-CM | POA: Diagnosis not present

## 2016-07-10 DIAGNOSIS — M1712 Unilateral primary osteoarthritis, left knee: Secondary | ICD-10-CM | POA: Diagnosis not present

## 2016-07-10 DIAGNOSIS — M25562 Pain in left knee: Secondary | ICD-10-CM | POA: Diagnosis not present

## 2016-07-11 ENCOUNTER — Other Ambulatory Visit: Payer: Self-pay | Admitting: Family Medicine

## 2016-07-11 DIAGNOSIS — J61 Pneumoconiosis due to asbestos and other mineral fibers: Secondary | ICD-10-CM | POA: Diagnosis not present

## 2016-07-11 DIAGNOSIS — I110 Hypertensive heart disease with heart failure: Secondary | ICD-10-CM | POA: Diagnosis not present

## 2016-07-11 DIAGNOSIS — F339 Major depressive disorder, recurrent, unspecified: Secondary | ICD-10-CM | POA: Diagnosis not present

## 2016-07-11 DIAGNOSIS — J9610 Chronic respiratory failure, unspecified whether with hypoxia or hypercapnia: Secondary | ICD-10-CM | POA: Diagnosis not present

## 2016-07-11 DIAGNOSIS — J449 Chronic obstructive pulmonary disease, unspecified: Secondary | ICD-10-CM | POA: Diagnosis not present

## 2016-07-11 DIAGNOSIS — I5033 Acute on chronic diastolic (congestive) heart failure: Secondary | ICD-10-CM | POA: Diagnosis not present

## 2016-07-12 DIAGNOSIS — L814 Other melanin hyperpigmentation: Secondary | ICD-10-CM | POA: Diagnosis not present

## 2016-07-12 DIAGNOSIS — L570 Actinic keratosis: Secondary | ICD-10-CM | POA: Diagnosis not present

## 2016-07-12 DIAGNOSIS — F339 Major depressive disorder, recurrent, unspecified: Secondary | ICD-10-CM | POA: Diagnosis not present

## 2016-07-12 DIAGNOSIS — J9610 Chronic respiratory failure, unspecified whether with hypoxia or hypercapnia: Secondary | ICD-10-CM | POA: Diagnosis not present

## 2016-07-12 DIAGNOSIS — I5033 Acute on chronic diastolic (congestive) heart failure: Secondary | ICD-10-CM | POA: Diagnosis not present

## 2016-07-12 DIAGNOSIS — J61 Pneumoconiosis due to asbestos and other mineral fibers: Secondary | ICD-10-CM | POA: Diagnosis not present

## 2016-07-12 DIAGNOSIS — I110 Hypertensive heart disease with heart failure: Secondary | ICD-10-CM | POA: Diagnosis not present

## 2016-07-12 DIAGNOSIS — J449 Chronic obstructive pulmonary disease, unspecified: Secondary | ICD-10-CM | POA: Diagnosis not present

## 2016-07-12 NOTE — Telephone Encounter (Signed)
RF request for aripiprazole LOV: 04/01/16 Next ov: None Last written: 02/02/16 #30 w/ 2RF

## 2016-07-15 DIAGNOSIS — M170 Bilateral primary osteoarthritis of knee: Secondary | ICD-10-CM | POA: Diagnosis not present

## 2016-07-15 DIAGNOSIS — J61 Pneumoconiosis due to asbestos and other mineral fibers: Secondary | ICD-10-CM | POA: Diagnosis not present

## 2016-07-15 DIAGNOSIS — J9610 Chronic respiratory failure, unspecified whether with hypoxia or hypercapnia: Secondary | ICD-10-CM | POA: Diagnosis not present

## 2016-07-15 DIAGNOSIS — M25562 Pain in left knee: Secondary | ICD-10-CM | POA: Diagnosis not present

## 2016-07-15 DIAGNOSIS — F339 Major depressive disorder, recurrent, unspecified: Secondary | ICD-10-CM | POA: Diagnosis not present

## 2016-07-15 DIAGNOSIS — J449 Chronic obstructive pulmonary disease, unspecified: Secondary | ICD-10-CM | POA: Diagnosis not present

## 2016-07-15 DIAGNOSIS — I110 Hypertensive heart disease with heart failure: Secondary | ICD-10-CM | POA: Diagnosis not present

## 2016-07-15 DIAGNOSIS — I5033 Acute on chronic diastolic (congestive) heart failure: Secondary | ICD-10-CM | POA: Diagnosis not present

## 2016-07-15 DIAGNOSIS — M25561 Pain in right knee: Secondary | ICD-10-CM | POA: Diagnosis not present

## 2016-07-16 DIAGNOSIS — Z79899 Other long term (current) drug therapy: Secondary | ICD-10-CM | POA: Diagnosis not present

## 2016-07-16 DIAGNOSIS — M67811 Other specified disorders of synovium, right shoulder: Secondary | ICD-10-CM | POA: Diagnosis not present

## 2016-07-16 DIAGNOSIS — M25511 Pain in right shoulder: Secondary | ICD-10-CM | POA: Diagnosis not present

## 2016-07-17 DIAGNOSIS — H16143 Punctate keratitis, bilateral: Secondary | ICD-10-CM | POA: Diagnosis not present

## 2016-07-17 LAB — BASIC METABOLIC PANEL
BUN: 10 mg/dL (ref 7–25)
CHLORIDE: 103 mmol/L (ref 98–110)
CO2: 34 mmol/L — ABNORMAL HIGH (ref 20–31)
Calcium: 9.1 mg/dL (ref 8.6–10.4)
Creat: 0.99 mg/dL — ABNORMAL HIGH (ref 0.60–0.93)
GLUCOSE: 84 mg/dL (ref 65–99)
POTASSIUM: 3.6 mmol/L (ref 3.5–5.3)
SODIUM: 146 mmol/L (ref 135–146)

## 2016-07-18 ENCOUNTER — Encounter: Payer: Medicare Other | Attending: Physical Medicine & Rehabilitation | Admitting: Registered Nurse

## 2016-07-18 ENCOUNTER — Encounter: Payer: Self-pay | Admitting: Registered Nurse

## 2016-07-18 VITALS — BP 114/73 | HR 65

## 2016-07-18 DIAGNOSIS — M5416 Radiculopathy, lumbar region: Secondary | ICD-10-CM

## 2016-07-18 DIAGNOSIS — M1711 Unilateral primary osteoarthritis, right knee: Secondary | ICD-10-CM

## 2016-07-18 DIAGNOSIS — M797 Fibromyalgia: Secondary | ICD-10-CM | POA: Insufficient documentation

## 2016-07-18 DIAGNOSIS — Z79899 Other long term (current) drug therapy: Secondary | ICD-10-CM | POA: Diagnosis not present

## 2016-07-18 DIAGNOSIS — Z5181 Encounter for therapeutic drug level monitoring: Secondary | ICD-10-CM | POA: Insufficient documentation

## 2016-07-18 DIAGNOSIS — R2689 Other abnormalities of gait and mobility: Secondary | ICD-10-CM | POA: Insufficient documentation

## 2016-07-18 DIAGNOSIS — M1712 Unilateral primary osteoarthritis, left knee: Secondary | ICD-10-CM

## 2016-07-18 DIAGNOSIS — I2510 Atherosclerotic heart disease of native coronary artery without angina pectoris: Secondary | ICD-10-CM | POA: Diagnosis not present

## 2016-07-18 DIAGNOSIS — G894 Chronic pain syndrome: Secondary | ICD-10-CM

## 2016-07-18 MED ORDER — OXYCODONE HCL 10 MG PO TABS: 10.0000 mg | ORAL_TABLET | Freq: Four times a day (QID) | ORAL | 0 refills | Status: DC | PRN

## 2016-07-18 NOTE — Progress Notes (Signed)
Subjective:    Patient ID: Linda [name], female    DOB: 27-Apr-1946, 70 y.o.   MRN: [id number]  HPI: [name] is a 70 year old female who returns for follow up appointment for chronic pain and medication refill. She states her pain is located in her  lower back radiating into her lower extremites laterally and bilateral knee pain.  She rates her pain 7. Her current exercise regime is attending physical therapy two days a week at [facility] and walking. Also asked if she could have her percocets back due to pain not being controlled, Dr. Naaman Plummer changed her to Oxycodone she was instructed she can take tylenol and not to exceed 2000 mg daily. She hasn't been using the tylenol, we will prescribe the oxycodone and follow Dr. Naaman Plummer instructions. She verbalizes understanding.    Pain Inventory Average Pain 7 Pain Right Now 7 My pain is sharp and aching  In the last 24 hours, has pain interfered with the following? General activity 9 Relation with others 9 Enjoyment of life 10 What TIME of day is your pain at its worst? morning, daytime and evening Sleep (in general) Fair  Pain is worse with: walking, bending, standing and some activites Pain improves with: medication Relief from Meds: 2  Mobility walk with assistance use a walker how many minutes can you walk? 5 ability to climb steps?  no do you drive?  yes transfers alone  Function retired I need assistance with the following:  meal prep, household duties and shopping Do you have any goals in this area?  yes  Neuro/Psych bladder control problems weakness trouble walking depression  Prior Studies Any changes since last visit?  no  Physicians involved in your care Any changes since last visit?  no   Family History  Problem Relation Age of Onset  . Arthritis Mother   . Kidney disease Mother   . Heart disease Father   . Stroke Father   . Hypertension Father   . Diabetes Father   . Heart  attack Father   . Heart attack Paternal Grandmother    Social History   Social History  . Marital status: Widowed    Spouse name: N/A  . Number of children: 2  . Years of education: N/A   Social History Main Topics  . Smoking status: Never Smoker  . Smokeless tobacco: Never Used     Comment: never used tobacco  . Alcohol use No  . Drug use: No  . Sexual activity: Not Currently   Other Topics Concern  . None   Social History Narrative   Widowed, 2 sons.  Relocated to [place] 09/2012 to be closer to her son who has MS.   Husband d 2015--mesothelioma.   Occupation: former Pharmacist, hospital.   Education: masters degree level.   No T/A/Ds.   Past Surgical History:  Procedure Laterality Date  . APPENDECTOMY  1960  . AXILLARY SURGERY Left 1978   Multiple "lump" in armpit per pt  . CARDIAC CATHETERIZATION  01/2013   nonobstructive CAD, EF 55-60%  . CARDIOVASCULAR STRESS TEST  02/22/15   Low risk myocard perf imaging; wall motion normal, normal EF  . COCCYX REMOVAL  1972  . DILATION AND CURETTAGE OF UTERUS  ? 1970's  . ESOPHAGOGASTRODUODENOSCOPY N/A 07/19/2014   Gastritis found + in the setting of supratherapeutic INR, +plavix, + meloxicam.  . EYE SURGERY Left 2012-2013   "injections for ~ 1 yr; don't really know what  for" (07/12/2013)  . HEEL SPUR SURGERY Left 2008  . KNEE SURGERY  2005  . LEFT HEART CATHETERIZATION WITH CORONARY ANGIOGRAM N/A 01/30/2013   Procedure: LEFT HEART CATHETERIZATION WITH CORONARY ANGIOGRAM;  Surgeon: Clent Demark, MD;  Location: [facility] LAB;  Service: Cardiovascular;  Laterality: N/A;  . PLANTAR FASCIA RELEASE Left 2008  . SPIROMETRY  04/25/14   In hosp for acute asthma/COPD flare: mixed obstructive and restrictive lung disease. The FEV1 is severely reduced at 45% predicted.  FEV1 signif decreased compared to prior spirometry 07/23/13.  . TENDON RELEASE  1996   Right forearm and hand  . TOTAL ABDOMINAL HYSTERECTOMY  1974  . TRANSTHORACIC ECHOCARDIOGRAM  01/2013;  04/2014;08/2015   2014--NORMAL.  2015--focal basal septal hypertrophy, EF 55-60%, grade I diast dysfxn, mild LAE.  08/2015 EF 55-60%, nl LV syst fxn, grade I DD, valves wnl   Past Medical History:  Diagnosis Date  . Acute upper GI bleed 06/2014   while pt taking coumadin, plavix, and meloxicam---despite being told not to take coumadin.  . Anginal pain ([place])    Nonobstructive CAD 2014; however, her cardiologist put her on a statin for this and NOT for hyperlipidemia per pt report.  . Anxiety    panic attacks  . Asthma    w/ asbestososis   . BPPV (benign paroxysmal positional vertigo) 12/16/2012  . Chronic lower back pain   . COPD (chronic obstructive pulmonary disease) ([place])   . DDD (degenerative disc disease)    lumbar and cervical.   . Depression   . Diastolic congestive heart failure ([place])    zaroxolyn 2.5mg  q week prn dose added 06/27/16 by cardiology  . Diverticular disease   . Fibromyalgia    Patient states dx was around her late 89s but she had sx's for years prior to this.  . H/O hiatal hernia   . Hay fever   . History of pneumonia    hospitalized 12/2011, 02/2013, and 07/2013 [facility]) for this  . HTN (hypertension)   . Idiopathic angio-edema-urticaria 72014   Angioedema component was very minimal  . Insomnia   . Iron deficiency anemia    Hematologist in [place], [place] did extensive w/u; no cause found; failed oral supplement;; gets fairly regular (q61m or so) IV iron infusions (Venofer -iron sucrose- 200mg  with procrit.  "for 14 yr I've been getting blood work q month & getting infusions prn" (07/12/2013).  Dr. Marin Olp locally, iron infusions done, EPO deficiency dx'd  . Migraine syndrome    "not as often anymore; used to be ~ q wk" (07/12/2013)  . Mixed incontinence urge and stress   . Nephrolithiasis    "passed all on my own or they are still in there" (07/12/2013)  . OSA on CPAP    prior to move to --had another sleep study 10/2015 w/pulm Dr. Camillo Flaming.  . Osteoarthritis     "severe; progressing fast" (07/12/2013); multiple joints-not surgical candidate for TKR (03/2015)  . Pernicious anemia 08/24/2014  . Pleural plaque with presence of asbestos 07/22/2013  . Pulmonary embolism ([place]) 07/2013   Dx at [facility] with very small peripheral upper lobe pe 07/2013: pt took coumadin for about 8-9 mo  . Pyelonephritis    "several times over the last 30 yr" (07/12/2013)  . RBBB (right bundle branch block)   . Recurrent UTI    hx of hospitalization for pyelonephritis; started abx prophylaxis 06/2015  . Syncope    Hypotensive; ED visit--Dr. Terrence Dupont did Cath--nonobstructive CAD, EF 55-60%.  In retrospect, suspect pt rx med misuse/polypharmacy  . Tension headache, chronic    There were no vitals taken for this visit.  Opioid Risk Score:   Fall Risk Score:  `1  Depression screen PHQ 2/9  Depression screen [facility] 2/9 06/12/2016 04/19/2016 08/11/2015 07/12/2015 03/27/2015 03/31/2014  Decreased Interest 1 0 3 0 3 0  Down, Depressed, Hopeless 0 0 3 1 3  0  PHQ - 2 Score 1 0 6 1 6  0  Altered sleeping - - 2 - 2 -  Tired, decreased energy - - 3 - 3 -  Change in appetite - - 0 - 2 -  Feeling bad or failure about yourself  - - 2 - 2 -  Trouble concentrating - - 0 - 2 -  Moving slowly or fidgety/restless - - 0 - 0 -  Suicidal thoughts - - 0 - 0 -  PHQ-9 Score - - 13 - 17 -  Some recent data might be hidden    Review of Systems  HENT: Negative.   Eyes: Negative.   Respiratory: Negative.   Cardiovascular: Negative.   Gastrointestinal: Negative.   Endocrine: Negative.   Genitourinary: Negative.   Musculoskeletal: Negative.   Skin: Negative.   Allergic/Immunologic: Negative.   Neurological: Positive for weakness.  Hematological: Negative.   Psychiatric/Behavioral: Negative.        Objective:   Physical Exam  Constitutional: She is oriented to person, place, and time. She appears well-developed and well-nourished.  HENT:  Head: Normocephalic and atraumatic.  Neck: Normal range of  motion. Neck supple.  Cardiovascular: Normal rate and regular rhythm.   Pulmonary/Chest: Effort normal and breath sounds normal.  Musculoskeletal:  Normal Muscle Bulk and Muscle testing Reveals: Upper Extremities: Full ROM and Muscle Strength 5/5 Lumbar Paraspinal Tenderness: L-4- L-5 Lower Extremities: Decreased ROM and Muscle Strength 5/5 Bilateral Lower extremities Flexion Produces pain into Patella's Arises from table slowly using walker for support Antalgic gait  Neurological: She is alert and oriented to person, place, and time.  Skin: Skin is warm and dry.  Psychiatric: She has a normal mood and affect.  Nursing note and vitals reviewed.         Assessment & Plan:  1. Functional deficits secondary to Gait disorder: Continue with Physical Therapy. 2. Chronic Back pain/ Lumbar Radiculitis/fibromyalgia/R>L Knee OA Pain Management:  Refilled: Oxycodone 10/325 one tablet every 6 hours #100/ Continue Lyrica and Voltaren Gel  3. Depression with anxiety/Grief reaction/Mood: Continue Trazodone and Cymbalta .  4. [name] with asthma: Oxygen dependent. Albuterol prn.  On Continuous Oxygen Therapy 3 liters/ Pulmonology Following. 5. Bilateral Osteoarthritis of Bilateral Knees: Dr. Juanda Bond Following. 6. Fibromyalgia: Continue Lyrica and exercise regime.  20 minutes of face to face patient care time was spent during this visit. All questions were encouraged and answered.

## 2016-07-18 NOTE — Patient Instructions (Signed)
Call Office the second week in August to evaluate medications.  [phone]

## 2016-07-23 ENCOUNTER — Encounter: Payer: Self-pay | Admitting: Physician Assistant

## 2016-07-23 ENCOUNTER — Ambulatory Visit (INDEPENDENT_AMBULATORY_CARE_PROVIDER_SITE_OTHER): Payer: Medicare Other | Admitting: Physician Assistant

## 2016-07-23 VITALS — BP 130/80 | HR 80 | Ht 64.0 in | Wt 172.0 lb

## 2016-07-23 DIAGNOSIS — I5032 Chronic diastolic (congestive) heart failure: Secondary | ICD-10-CM | POA: Diagnosis not present

## 2016-07-23 DIAGNOSIS — I10 Essential (primary) hypertension: Secondary | ICD-10-CM | POA: Diagnosis not present

## 2016-07-23 DIAGNOSIS — J9611 Chronic respiratory failure with hypoxia: Secondary | ICD-10-CM

## 2016-07-23 DIAGNOSIS — I110 Hypertensive heart disease with heart failure: Secondary | ICD-10-CM | POA: Diagnosis not present

## 2016-07-23 DIAGNOSIS — J449 Chronic obstructive pulmonary disease, unspecified: Secondary | ICD-10-CM | POA: Diagnosis not present

## 2016-07-23 DIAGNOSIS — J61 Pneumoconiosis due to asbestos and other mineral fibers: Secondary | ICD-10-CM | POA: Diagnosis not present

## 2016-07-23 DIAGNOSIS — F339 Major depressive disorder, recurrent, unspecified: Secondary | ICD-10-CM | POA: Diagnosis not present

## 2016-07-23 DIAGNOSIS — I2510 Atherosclerotic heart disease of native coronary artery without angina pectoris: Secondary | ICD-10-CM

## 2016-07-23 DIAGNOSIS — I5033 Acute on chronic diastolic (congestive) heart failure: Secondary | ICD-10-CM | POA: Diagnosis not present

## 2016-07-23 DIAGNOSIS — J9610 Chronic respiratory failure, unspecified whether with hypoxia or hypercapnia: Secondary | ICD-10-CM | POA: Diagnosis not present

## 2016-07-23 MED ORDER — METOPROLOL SUCCINATE ER 25 MG PO TB24: 25.0000 mg | ORAL_TABLET | Freq: Every day | ORAL | 11 refills | Status: DC

## 2016-07-23 NOTE — Progress Notes (Signed)
Cardiology Office Note   Date:  07/23/2016   ID:  Linda [name], DOB [birth year], MRN [id number]  PCP:  Tammi Sou, MD  Cardiologist:  Dr Alcide Evener, PA-C   Chief Complaint  Patient presents with  . Follow-up    has some shortness breath, has pain and cramping in legs, has some lightheadedness, no other conplaints    History of Present Illness: Linda [name] is a 70 y.o. female with a history of COPD on O2 3 lpm, HTN, ?orthostatic syncope, D-CHF, mod CAD by cath 2014 w/ med rx, fibromyalgia w/ chronic pain issues.   Seen 07/06 after admit for CHF>>rehab>>o.v. 06/29. More diuresis needed, Zaroxolyn added. Lab error made Cr appear elevated, meds changed and then changed back.  Linda [name] presents for follow-up of her CHF  Her shortness of breath is significant but at baseline. She has not had lower extremity edema. She is taking her Lasix at 40 mg once a day. She urinates very frequently because of this.  She has a weight chart with her and her weight will jump up sometimes but only for a day or so, and then go back down. At home, her base weight is approximately 168 pounds. She denies orthopnea or PND. She is on oxygen 3 L continuously at home. In the office today, her oxygen saturation was 85% on room air. The reason she does not have oxygen with her today is because she was unable to find transportation and had to drive herself. She cannot get her role later which holds her oxygen obtained in the car alone. Therefore, she did not bring it. She is not acutely short of breath at rest, but gets short of breath with minimal activity. She had not had chest pain.  She complains of occasional cramping in her legs and hands.  Past Medical History:  Diagnosis Date  . Acute upper GI bleed 06/2014   while pt taking coumadin, plavix, and meloxicam---despite being told not to take coumadin.  . Anginal pain ([place])    Nonobstructive CAD 2014; however, her  cardiologist put her on a statin for this and NOT for hyperlipidemia per pt report.  . Anxiety    panic attacks  . Asthma    w/ asbestososis   . BPPV (benign paroxysmal positional vertigo) 12/16/2012  . Chronic lower back pain   . COPD (chronic obstructive pulmonary disease) ([place])   . DDD (degenerative disc disease)    lumbar and cervical.   . Depression   . Diastolic congestive heart failure ([place])    zaroxolyn 2.5mg  q week prn dose added 06/27/16 by cardiology  . Diverticular disease   . Fibromyalgia    Patient states dx was around her late 84s but she had sx's for years prior to this.  . H/O hiatal hernia   . Hay fever   . History of pneumonia    hospitalized 12/2011, 02/2013, and 07/2013 [facility]) for this  . HTN (hypertension)   . Idiopathic angio-edema-urticaria 72014   Angioedema component was very minimal  . Insomnia   . Iron deficiency anemia    Hematologist in [place], [place] did extensive w/u; no cause found; failed oral supplement;; gets fairly regular (q85m or so) IV iron infusions (Venofer -iron sucrose- 200mg  with procrit.  "for 14 yr I've been getting blood work q month & getting infusions prn" (07/12/2013).  Dr. Marin Olp locally, iron infusions done, EPO deficiency dx'd  . Migraine syndrome    "not  as often anymore; used to be ~ q wk" (07/12/2013)  . Mixed incontinence urge and stress   . Nephrolithiasis    "passed all on my own or they are still in there" (07/12/2013)  . OSA on CPAP    prior to move to [place]--had another sleep study 10/2015 w/pulm Dr. Camillo Flaming.  . Osteoarthritis    "severe; progressing fast" (07/12/2013); multiple joints-not surgical candidate for TKR (03/2015)  . Pernicious anemia 08/24/2014  . Pleural plaque with presence of asbestos 07/22/2013  . Pulmonary embolism ([place]) 07/2013   Dx at [facility] with very small peripheral upper lobe pe 07/2013: pt took coumadin for about 8-9 mo  . Pyelonephritis    "several times over the last 30 yr" (07/12/2013)  . RBBB (right bundle  branch block)   . Recurrent UTI    hx of hospitalization for pyelonephritis; started abx prophylaxis 06/2015  . Syncope    Hypotensive; ED visit--Dr. Terrence Dupont did Cath--nonobstructive CAD, EF 55-60%.  In retrospect, suspect pt rx med misuse/polypharmacy  . Tension headache, chronic     Past Surgical History:  Procedure Laterality Date  . APPENDECTOMY  1960  . AXILLARY SURGERY Left 1978   Multiple "lump" in armpit per pt  . CARDIAC CATHETERIZATION  01/2013   nonobstructive CAD, EF 55-60%  . CARDIOVASCULAR STRESS TEST  02/22/15   Low risk myocard perf imaging; wall motion normal, normal EF  . COCCYX REMOVAL  1972  . DILATION AND CURETTAGE OF UTERUS  ? 1970's  . ESOPHAGOGASTRODUODENOSCOPY N/A 07/19/2014   Gastritis found + in the setting of supratherapeutic INR, +plavix, + meloxicam.  . EYE SURGERY Left 2012-2013   "injections for ~ 1 yr; don't really know what for" (07/12/2013)  . HEEL SPUR SURGERY Left 2008  . KNEE SURGERY  2005  . LEFT HEART CATHETERIZATION WITH CORONARY ANGIOGRAM N/A 01/30/2013   Procedure: LEFT HEART CATHETERIZATION WITH CORONARY ANGIOGRAM;  Surgeon: Clent Demark, MD;  Location: [facility] CATH LAB;  Service: Cardiovascular;  Laterality: N/A;  . PLANTAR FASCIA RELEASE Left 2008  . SPIROMETRY  04/25/14   In hosp for acute asthma/COPD flare: mixed obstructive and restrictive lung disease. The FEV1 is severely reduced at 45% predicted.  FEV1 signif decreased compared to prior spirometry 07/23/13.  . TENDON RELEASE  1996   Right forearm and hand  . TOTAL ABDOMINAL HYSTERECTOMY  1974  . TRANSTHORACIC ECHOCARDIOGRAM  01/2013; 04/2014;08/2015   2014--NORMAL.  2015--focal basal septal hypertrophy, EF 55-60%, grade I diast dysfxn, mild LAE.  08/2015 EF 55-60%, nl LV syst fxn, grade I DD, valves wnl    Current Outpatient Prescriptions  Medication Sig Dispense Refill  . acetaminophen (TYLENOL) 325 MG tablet Take 2 tablets (650 mg total) by mouth every 4 (four) hours as needed for headache or  mild pain.    [name] Kitchen albuterol (PROVENTIL HFA;VENTOLIN HFA) 108 (90 BASE) MCG/ACT inhaler Inhale 2 puffs into the lungs every 6 (six) hours as needed for wheezing or shortness of breath. 1 Inhaler 1  . ALPRAZolam (XANAX) 1 MG tablet Take 1 mg by mouth 3 (three) times daily as needed for anxiety.    . ARIPiprazole (ABILIFY) 10 MG tablet TAKE 1 TABLET ONCE DAILY. 30 tablet 2  . aspirin 81 MG tablet Take 81 mg by mouth at bedtime.    [name] Kitchen atorvastatin (LIPITOR) 20 MG tablet Take 20 mg by mouth at bedtime.    . budesonide-formoterol (SYMBICORT) 160-4.5 MCG/ACT inhaler Inhale 2 puffs into the lungs 2 (two) times daily. 1  Inhaler 12  . Cholecalciferol (VITAMIN D3) 50000 units CAPS Take 1 capsule by mouth every Monday.     . diclofenac sodium (VOLTAREN) 1 % GEL Apply 1 application topically 3 (three) times daily as needed (pain).     . DULoxetine (CYMBALTA) 60 MG capsule Take 1 capsule (60 mg total) by mouth at bedtime.  3  . folic acid (FOLVITE) 1 MG tablet Take 2 tablets (2 mg total) by mouth daily. 60 tablet 6  . furosemide (LASIX) 40 MG tablet Take 40 mg by mouth.    [name] Kitchen ibuprofen (ADVIL,MOTRIN) 800 MG tablet Take 800 mg by mouth daily as needed (pain).    . isosorbide mononitrate (IMDUR) 30 MG 24 hr tablet Take 30 mg by mouth daily.    [name] Kitchen LYRICA 300 MG capsule TAKE (1) CAPSULE TWICE DAILY. 60 capsule 0  . Multiple Vitamin (MULTIVITAMIN WITH MINERALS) TABS tablet Take 1 tablet by mouth daily.    . naphazoline-pheniramine (NAPHCON-A) 0.025-0.3 % ophthalmic solution Place 1 drop into both eyes 4 (four) times daily as needed for irritation.    . nebivolol (BYSTOLIC) 5 MG tablet Take 5 mg by mouth daily.    . nitroGLYCERIN (NITROSTAT) 0.4 MG SL tablet Place 0.4 mg under the tongue every 5 (five) minutes as needed for chest pain (x 3 doses).     . ondansetron (ZOFRAN-ODT) 4 MG disintegrating tablet Take 4 mg by mouth every 8 (eight) hours as needed for nausea or vomiting.    . Oxycodone HCl 10 MG TABS Take 1 tablet  (10 mg total) by mouth every 6 (six) hours as needed. 100 tablet 0  . OXYGEN Inhale 3 L into the lungs continuous.    . pantoprazole (PROTONIX) 40 MG tablet Take 1 tablet (40 mg total) by mouth daily. 90 tablet 1  . polyethylene glycol (MIRALAX / GLYCOLAX) packet Take 17 g by mouth daily as needed (constipation). Mix in 8 oz liquid and drink    . potassium chloride SA (K-DUR,KLOR-CON) 20 MEQ tablet Take 20-40 mEq by mouth 2 (two) times daily. Take 2 tablets (40 meq) by mouth every morning and 1 tablet (20 meq) at night    . pregabalin (LYRICA) 100 MG capsule Take 100 mg by mouth daily. Take QAM    . traZODone (DESYREL) 50 MG tablet Take 50 mg by mouth at bedtime. INSOMNIA. Patient taking 2-4 tablets at bedtime .     No current facility-administered medications for this visit.    Facility-Administered Medications Ordered in Other Visits  Medication Dose Route Frequency Provider Last Rate Last Dose  . ferumoxytol (FERAHEME) 510 mg in sodium chloride 0.9 % 100 mL IVPB  510 mg Intravenous Once Volanda Napoleon, MD        Allergies:   Penicillins    Social History:  The patient  reports that she has never smoked. She has never used smokeless tobacco. She reports that she does not drink alcohol or use drugs.   Family History:  The patient's family history includes Arthritis in her mother; Diabetes in her father; Heart attack in her father and paternal grandmother; Heart disease in her father; Hypertension in her father; Kidney disease in her mother; Stroke in her father.    ROS:  Please see the history of present illness. All other systems are reviewed and negative.    PHYSICAL EXAM: VS:  BP 130/80 (BP Location: Left Arm, Patient Position: Sitting, Cuff Size: Normal)   Pulse 80   Ht 5\' 4"  (1.626  m)   Wt 172 lb (78 kg)   SpO2 95%   BMI 29.52 kg/m  , BMI Body mass index is 29.52 kg/m. GEN: Well nourished, well developed, female in no acute distress  HEENT: normal for age  Neck: Minimal  JVD, no hepatojugular reflux, no carotid bruit, no masses Cardiac: RRR; no murmur, no rubs, or gallops Respiratory: Decreased breath sounds bases bilaterally, normal work of breathing GI: soft, nontender, nondistended, + BS MS: no deformity or atrophy; no edema; distal pulses are 2+ in all 4 extremities   Skin: warm and dry, no rash Neuro:  Strength and sensation are intact Psych: euthymic mood, full affect   EKG:  EKG is not ordered today.  Recent Labs: 08/24/2015: Pro B Natriuretic peptide (BNP) 92.0 04/08/2016: Magnesium 2.0; TSH 1.002 05/30/2016: ALT 15; B Natriuretic Peptide 87.9 06/02/2016: Hemoglobin 12.4; Platelets 194 07/16/2016: BUN 10; Creat 0.99; Potassium 3.6; Sodium 146    Lipid Panel    Component Value Date/Time   CHOL 152 01/31/2013 0500   TRIG 92 01/31/2013 0500   HDL 44 01/31/2013 0500   CHOLHDL 3.5 01/31/2013 0500   VLDL 18 01/31/2013 0500   LDLCALC 90 01/31/2013 0500     Wt Readings from Last 3 Encounters:  07/23/16 172 lb (78 kg)  06/27/16 177 lb 3.2 oz (80.4 kg)  06/20/16 175 lb 12.8 oz (79.7 kg)     Other studies Reviewed: Additional studies/ records that were reviewed today include: Office notes, hospital records and testing.  ASSESSMENT AND PLAN:  1.  Chronic diastolic CHF: Her weight is down and her respiratory status is at baseline. She is encouraged to continue current medical therapy. A recent BMET showed a normal BUN and creatinine. Her potassium was low-normal.  Continue current diuretic therapy. She is to take an extra potassium tablet today. She is also clear to take an extra Lasix and potassium tablet daily when necessary or weight gain or edema.  She is concerned about managing her volume status as an outpatient. We'll see her in a month, she may require monthly visits to help manage her volume and keep her out of the hospital.  2. Hypertension: Her blood pressure and heart rate are well controlled today. However, she dislikes the Bystolic  and states that she did better on the metoprolol. We will change her from Bystolic 5 mg daily to Toprol-XL 25 mg daily as she prefers once a day dosing  3. Hypoxia: She is on O2 at home at 3 L/m chronically. She has not had any recent problems with wheezing. Her volume status is good. She is to continue home oxygen at current level and is encouraged to make appointments for when she can take her oxygen with her. He was given information on [place] as they may be able to help her transportation issues as well.  Current medicines are reviewed at length with the patient today.  The patient does not have concerns regarding medicines.  The following changes have been made:  1 extra potassium tablet today, and an additional Lasix/potassium tablet daily when necessary  Labs/ tests ordered today include:   recheck BMET in one month when she comes for follow-up visit   Disposition:   FU with me or Dr. Stanford Breed in one month  Signed, Lenoard Aden  07/23/2016 1:03 PM    [place] Phone: [phone]; Fax: [phone]  This note was written with the assistance of speech recognition software. Please excuse  any transcriptional errors.

## 2016-07-23 NOTE — Patient Instructions (Addendum)
Medication Instructions:  START Toprol XL 25 mg daily and Take an extra tablet of Lasix for swelling and an extra potassium  Labwork: NONE  Testing/Procedures: NONE  Follow-Up: Your physician recommends that you schedule a follow-up appointment in: 1 Month   Any Other Special Instructions Will Be Listed Below (If Applicable).   If you need a refill on your cardiac medications before your next appointment, please call your pharmacy.

## 2016-07-25 ENCOUNTER — Other Ambulatory Visit: Payer: Self-pay | Admitting: Family Medicine

## 2016-07-29 ENCOUNTER — Telehealth: Payer: Self-pay | Admitting: Physical Medicine & Rehabilitation

## 2016-07-29 NOTE — Telephone Encounter (Signed)
Patient was told to call Linda [name] this week to evaluate her medication.  Please call her at [phone].

## 2016-07-30 NOTE — Telephone Encounter (Signed)
Return Ms. Kosik call, no answer. Left message to return the call.

## 2016-08-01 ENCOUNTER — Encounter: Payer: Self-pay | Admitting: Registered Nurse

## 2016-08-01 ENCOUNTER — Encounter: Payer: Medicare Other | Attending: Physical Medicine & Rehabilitation | Admitting: Registered Nurse

## 2016-08-01 VITALS — BP 147/78 | HR 77 | Resp 16

## 2016-08-01 DIAGNOSIS — M797 Fibromyalgia: Secondary | ICD-10-CM | POA: Diagnosis not present

## 2016-08-01 DIAGNOSIS — Z79899 Other long term (current) drug therapy: Secondary | ICD-10-CM | POA: Insufficient documentation

## 2016-08-01 DIAGNOSIS — M1712 Unilateral primary osteoarthritis, left knee: Secondary | ICD-10-CM

## 2016-08-01 DIAGNOSIS — I2510 Atherosclerotic heart disease of native coronary artery without angina pectoris: Secondary | ICD-10-CM

## 2016-08-01 DIAGNOSIS — M1711 Unilateral primary osteoarthritis, right knee: Secondary | ICD-10-CM | POA: Insufficient documentation

## 2016-08-01 DIAGNOSIS — M5416 Radiculopathy, lumbar region: Secondary | ICD-10-CM

## 2016-08-01 DIAGNOSIS — G894 Chronic pain syndrome: Secondary | ICD-10-CM

## 2016-08-01 DIAGNOSIS — R2689 Other abnormalities of gait and mobility: Secondary | ICD-10-CM | POA: Insufficient documentation

## 2016-08-01 DIAGNOSIS — Z5181 Encounter for therapeutic drug level monitoring: Secondary | ICD-10-CM | POA: Diagnosis not present

## 2016-08-01 MED ORDER — OXYCODONE HCL 10 MG PO TABS: 10.0000 mg | ORAL_TABLET | Freq: Four times a day (QID) | ORAL | 0 refills | Status: AC | PRN

## 2016-08-01 NOTE — Progress Notes (Signed)
Subjective:    Patient ID: Linda [name], female    DOB: June 27, 1946, 70 y.o.   MRN: [id number]  HPI:  Linda [name] is a 70 year old female who returns for follow up appointment for chronic pain and medication refill. She states her pain is located in herlower back radiating into her lower extremites posterioraly, right hip pain and bilateral knee pain. She rates her pain 5. Her current exercise regime is performing stretching exercises, using the Nu-Step three days a week and walking.  Pain Inventory Average Pain 6 Pain Right Now 5 My pain is intermittent, sharp and aching  In the last 24 hours, has pain interfered with the following? General activity 9 Relation with others 9 Enjoyment of life 10 What TIME of day is your pain at its worst? daytime Sleep (in general) Fair  Pain is worse with: walking, bending, standing and some activites Pain improves with: medication Relief from Meds: 5  Mobility use a walker how many minutes can you walk? 5-10 ability to climb steps?  no do you drive?  yes  Function retired I need assistance with the following:  meal prep, household duties and shopping  Neuro/Psych bladder control problems numbness tingling trouble walking spasms anxiety  Prior Studies Any changes since last visit?  no  Physicians involved in your care Any changes since last visit?  no   Family History  Problem Relation Age of Onset  . Arthritis Mother   . Kidney disease Mother   . Heart disease Father   . Stroke Father   . Hypertension Father   . Diabetes Father   . Heart attack Father   . Heart attack Paternal Grandmother    Social History   Social History  . Marital status: Widowed    Spouse name: N/A  . Number of children: 2  . Years of education: N/A   Occupational History  . Retired    Social History Main Topics  . Smoking status: Never Smoker  . Smokeless tobacco: Never Used     Comment: never used tobacco  . Alcohol  use No  . Drug use: No  . Sexual activity: Not Currently   Other Topics Concern  . None   Social History Narrative   Widowed, 2 sons.  Relocated to [place] 09/2012 to be closer to her son who has MS.   Husband d 2015--mesothelioma.   Occupation: former Pharmacist, hospital.   Education: masters degree level.   No T/A/Ds.   Lives in [place], independent living.   Past Surgical History:  Procedure Laterality Date  . APPENDECTOMY  1960  . AXILLARY SURGERY Left 1978   Multiple "lump" in armpit per pt  . CARDIAC CATHETERIZATION  01/2013   nonobstructive CAD, EF 55-60%  . CARDIOVASCULAR STRESS TEST  02/22/15   Low risk myocard perf imaging; wall motion normal, normal EF  . COCCYX REMOVAL  1972  . DILATION AND CURETTAGE OF UTERUS  ? 1970's  . ESOPHAGOGASTRODUODENOSCOPY N/A 07/19/2014   Gastritis found + in the setting of supratherapeutic INR, +plavix, + meloxicam.  . EYE SURGERY Left 2012-2013   "injections for ~ 1 yr; don't really know what for" (07/12/2013)  . HEEL SPUR SURGERY Left 2008  . KNEE SURGERY  2005  . LEFT HEART CATHETERIZATION WITH CORONARY ANGIOGRAM N/A 01/30/2013   Procedure: LEFT HEART CATHETERIZATION WITH CORONARY ANGIOGRAM;  Surgeon: Clent Demark, MD;  Location: [facility] CATH LAB;  Service: Cardiovascular;  Laterality: N/A;  . PLANTAR  FASCIA RELEASE Left 2008  . SPIROMETRY  04/25/14   In hosp for acute asthma/COPD flare: mixed obstructive and restrictive lung disease. The FEV1 is severely reduced at 45% predicted.  FEV1 signif decreased compared to prior spirometry 07/23/13.  . TENDON RELEASE  1996   Right forearm and hand  . TOTAL ABDOMINAL HYSTERECTOMY  1974  . TRANSTHORACIC ECHOCARDIOGRAM  01/2013; 04/2014;08/2015   2014--NORMAL.  2015--focal basal septal hypertrophy, EF 55-60%, grade I diast dysfxn, mild LAE.  08/2015 EF 55-60%, nl LV syst fxn, grade I DD, valves wnl   Past Medical History:  Diagnosis Date  . Acute upper GI bleed 06/2014   while pt taking coumadin, plavix, and  meloxicam---despite being told not to take coumadin.  . Anginal pain ([place])    Nonobstructive CAD 2014; however, her cardiologist put her on a statin for this and NOT for hyperlipidemia per pt report.  . Anxiety    panic attacks  . Asthma    w/ asbestososis   . BPPV (benign paroxysmal positional vertigo) 12/16/2012  . Chronic lower back pain   . COPD (chronic obstructive pulmonary disease) ([place])   . DDD (degenerative disc disease)    lumbar and cervical.   . Depression   . Diastolic congestive heart failure ([place])    zaroxolyn 2.5mg  q week prn dose added 06/27/16 by cardiology  . Diverticular disease   . Fibromyalgia    Patient states dx was around her late 27s but she had sx's for years prior to this.  . H/O hiatal hernia   . Hay fever   . History of pneumonia    hospitalized 12/2011, 02/2013, and 07/2013 [facility]) for this  . HTN (hypertension)   . Idiopathic angio-edema-urticaria 72014   Angioedema component was very minimal  . Insomnia   . Iron deficiency anemia    Hematologist in [place] did extensive w/u; no cause found; failed oral supplement;; gets fairly regular (q63m or so) IV iron infusions (Venofer -iron sucrose- 200mg  with procrit.  "for 14 yr I've been getting blood work q month & getting infusions prn" (07/12/2013).  Dr. Marin Olp locally, iron infusions done, EPO deficiency dx'd  . Migraine syndrome    "not as often anymore; used to be ~ q wk" (07/12/2013)  . Mixed incontinence urge and stress   . Nephrolithiasis    "passed all on my own or they are still in there" (07/12/2013)  . OSA on CPAP    prior to move to [place]--had another sleep study 10/2015 w/pulm Dr. Camillo Flaming.  . Osteoarthritis    "severe; progressing fast" (07/12/2013); multiple joints-not surgical candidate for TKR (03/2015)  . Pernicious anemia 08/24/2014  . Pleural plaque with presence of asbestos 07/22/2013  . Pulmonary embolism ([place]) 07/2013   Dx at [facility] with very small peripheral upper lobe pe 07/2013: pt took  coumadin for about 8-9 mo  . Pyelonephritis    "several times over the last 30 yr" (07/12/2013)  . RBBB (right bundle branch block)   . Recurrent UTI    hx of hospitalization for pyelonephritis; started abx prophylaxis 06/2015  . Syncope    Hypotensive; ED visit--Dr. Terrence Dupont did Cath--nonobstructive CAD, EF 55-60%.  In retrospect, suspect pt rx med misuse/polypharmacy  . Tension headache, chronic    BP (!) 147/78 (BP Location: Left Arm, Patient Position: Sitting, Cuff Size: Normal)   Pulse 77   Resp 16   SpO2 92%   Opioid Risk Score:   Fall Risk Score:  `1  Depression  screen PHQ 2/9  Depression screen [facility] 2/9 08/01/2016 06/12/2016 04/19/2016 08/11/2015 07/12/2015 03/27/2015 03/31/2014  Decreased Interest 3 1 0 3 0 3 0  Down, Depressed, Hopeless 3 0 0 3 1 3  0  PHQ - 2 Score 6 1 0 6 1 6  0  Altered sleeping - - - 2 - 2 -  Tired, decreased energy - - - 3 - 3 -  Change in appetite - - - 0 - 2 -  Feeling bad or failure about yourself  - - - 2 - 2 -  Trouble concentrating - - - 0 - 2 -  Moving slowly or fidgety/restless - - - 0 - 0 -  Suicidal thoughts - - - 0 - 0 -  PHQ-9 Score - - - 13 - 17 -  Some recent data might be hidden     Review of Systems  Respiratory: Positive for shortness of breath.   Gastrointestinal: Positive for nausea.  Genitourinary: Positive for dysuria.  All other systems reviewed and are negative.      Objective:   Physical Exam  Constitutional: She is oriented to person, place, and time. She appears well-developed and well-nourished.  HENT:  Head: Normocephalic and atraumatic.  Neck: Normal range of motion. Neck supple.  Cardiovascular: Normal rate and regular rhythm.   Pulmonary/Chest: Effort normal and breath sounds normal.  Musculoskeletal:  Normal Muscle Bulk and Muscle Testing Reveals: Upper Extremities: Full ROM and Muscle Strength 5/5 Thoracic Paraspinal Tenderness: T-1- T-3 Right Rhomboid Tenderness Lumbar Paraspinal Tenderness: L-3- L-5 Lower  Extremities: Full ROM and Muscle Strength 5/5 Arises from chair slowly using walker for support Antalgic gait   Neurological: She is alert and oriented to person, place, and time.  Skin: Skin is warm and dry.  Psychiatric: She has a normal mood and affect.  Nursing note and vitals reviewed.         Assessment & Plan:  1. Functional deficits secondary to Gait disorder: Continue with HEP. 2. Chronic Back pain/ Lumbar Radiculitis/fibromyalgia/R>L Knee OA Pain Management:  Refilled: Oxycodone 10 mg one tablet every 6 hours #100/ Continue Lyrica and Voltaren Gel We will continue the opioid monitoring program, this consists of regular clinic visits, examinations, urine drug screen, pill counts as well as use of [place] Controlled Substance reporting System.  3. Depression with anxiety/Grief reaction/Mood: Continue Trazodone and Cymbalta .  4. [name] with asthma: Oxygen dependent. Albuterol prn.  On Continuous Oxygen Therapy 3 liters/ Pulmonology Following. 5. Bilateral Osteoarthritis of Bilateral Knees: Dr. Juanda Bond Following. 6. Fibromyalgia: Continue Lyrica and exercise regime.  20 minutes of face to face patient care time was spent during this visit. All questions were encouraged and answered.

## 2016-08-02 ENCOUNTER — Other Ambulatory Visit: Payer: Self-pay | Admitting: Family Medicine

## 2016-08-02 NOTE — Telephone Encounter (Signed)
RF request for isosobide LOV: 04/01/16 Next ov: None Last written: 12/12/15 #30 w/ 6RF

## 2016-08-06 DIAGNOSIS — J9611 Chronic respiratory failure with hypoxia: Secondary | ICD-10-CM | POA: Diagnosis not present

## 2016-08-06 DIAGNOSIS — G4733 Obstructive sleep apnea (adult) (pediatric): Secondary | ICD-10-CM | POA: Diagnosis not present

## 2016-08-06 DIAGNOSIS — J984 Other disorders of lung: Secondary | ICD-10-CM | POA: Diagnosis not present

## 2016-08-13 ENCOUNTER — Other Ambulatory Visit: Payer: Self-pay | Admitting: Family Medicine

## 2016-08-13 NOTE — Telephone Encounter (Signed)
RF request for pantoprazole LOV: 04/01/16 Next ov: None Last written: 05/03/15 #90 w/ 1RF

## 2016-08-23 ENCOUNTER — Other Ambulatory Visit: Payer: Self-pay | Admitting: Hematology & Oncology

## 2016-08-27 ENCOUNTER — Other Ambulatory Visit: Payer: Self-pay | Admitting: Hematology & Oncology

## 2016-08-27 DIAGNOSIS — M25511 Pain in right shoulder: Secondary | ICD-10-CM | POA: Diagnosis not present

## 2016-08-27 DIAGNOSIS — M19011 Primary osteoarthritis, right shoulder: Secondary | ICD-10-CM | POA: Diagnosis not present

## 2016-08-28 ENCOUNTER — Encounter: Payer: Self-pay | Admitting: Physician Assistant

## 2016-08-28 ENCOUNTER — Ambulatory Visit (INDEPENDENT_AMBULATORY_CARE_PROVIDER_SITE_OTHER): Payer: Medicare Other | Admitting: Physician Assistant

## 2016-08-28 VITALS — BP 130/64 | HR 74 | Ht 64.0 in | Wt 172.0 lb

## 2016-08-28 DIAGNOSIS — I5032 Chronic diastolic (congestive) heart failure: Secondary | ICD-10-CM

## 2016-08-28 DIAGNOSIS — I2510 Atherosclerotic heart disease of native coronary artery without angina pectoris: Secondary | ICD-10-CM

## 2016-08-28 DIAGNOSIS — I5033 Acute on chronic diastolic (congestive) heart failure: Secondary | ICD-10-CM | POA: Diagnosis not present

## 2016-08-28 LAB — BASIC METABOLIC PANEL WITH GFR
BUN: 15 mg/dL (ref 7–25)
CALCIUM: 9.4 mg/dL (ref 8.6–10.4)
CO2: 30 mmol/L (ref 20–31)
CREATININE: 0.88 mg/dL (ref 0.60–0.93)
Chloride: 100 mmol/L (ref 98–110)
GFR, EST AFRICAN AMERICAN: 77 mL/min (ref >=60)
GFR, EST NON AFRICAN AMERICAN: 67 mL/min (ref >=60)
Glucose, Bld: 82 mg/dL (ref 65–99)
Potassium: 3.6 mmol/L (ref 3.5–5.3)
SODIUM: 140 mmol/L (ref 135–146)

## 2016-08-28 NOTE — Patient Instructions (Signed)
Medications:  Your physician recommends that you continue on your current medications as directed. Please refer to the Current Medication list given to you today.   Labwork:  Your physician recommends that you return for lab work today: BMET   Other Instructions:  Your physician recommends that you continue to weigh, daily, at the same time every day, and in the same amount of clothing. Please record your daily weights on the handout provided and bring it to your next appointment.  Try to stay on a low sodium diet while on vacation.   Follow-Up:  Your physician recommends that you schedule a follow-up appointment in: 2 months with Rosaria Ferries, PA-C or Dr. Stanford Breed.

## 2016-08-28 NOTE — Progress Notes (Signed)
Cardiology Office Note   Date:  08/28/2016   ID:  Linda [name], DOB 11-13-1946, MRN [id number]  PCP:  Tammi Sou, MD  Cardiologist:  Dr. Alcide Evener, PA-C   Chief Complaint  Patient presents with  . Follow-up  . Dizziness  . Shortness of Breath  . Edema    History of Present Illness: Linda [name] is a 70 y.o. female with a history of COPD on O2 3 lpm, HTN, ?orthostatic syncope, D-CHF, mod CAD by cath 2014 w/ med rx, fibromyalgia w/ chronic pain issues.   Seen in the office 08/01, on oxygen at 3 L at home, no portable oxygen available. Ongoing volume management needed, follow-up 1 month. Weight 172 pounds  Linda [name] presents for Follow-up and further management of her CHF.  She is tracking her weight carefully on a daily basis. By her records, her weight has varied at times, up to 8 pounds different. She has taken the extra Lasix twice, and her weight has returned to normal both times. When she takes the extra Lasix, she takes the extra potassium. She has not had cramping or spasms.  Since she took the extra Lasix, she has not had lower extremity edema and feels that her respiratory status although poor, is at baseline. Her dyspnea on exertion is chronic and has not changed recently. She denies any new orthopnea or PND. She is trying to eat a low sodium diet. She will cough occasionally when doing deep breathing but she does not exert herself very much so she does not take a deep breath very often.  She mentions that she can do her ADLs, but it takes her a long time to get finished. She states she has been more active than usual lately, because of appointments, etc. She gets tired when she has to get out and about, but recovers by resting at home. She now has portable oxygen that she can carry with her and is using it today.   Past Medical History:  Diagnosis Date  . Acute upper GI bleed 06/2014   while pt taking coumadin, plavix, and  meloxicam---despite being told not to take coumadin.  . Anginal pain ([place])    Nonobstructive CAD 2014; however, her cardiologist put her on a statin for this and NOT for hyperlipidemia per pt report.  . Anxiety    panic attacks  . Asthma    w/ asbestososis   . BPPV (benign paroxysmal positional vertigo) 12/16/2012  . Chronic lower back pain   . COPD (chronic obstructive pulmonary disease) ([place])   . DDD (degenerative disc disease)    lumbar and cervical.   . Depression   . Diastolic congestive heart failure ([place])    zaroxolyn 2.5mg  q week prn dose added 06/27/16 by cardiology  . Diverticular disease   . Fibromyalgia    Patient states dx was around her late 80s but she had sx's for years prior to this.  . H/O hiatal hernia   . Hay fever   . History of pneumonia    hospitalized 12/2011, 02/2013, and 07/2013 [facility]) for this  . HTN (hypertension)   . Idiopathic angio-edema-urticaria 72014   Angioedema component was very minimal  . Insomnia   . Iron deficiency anemia    Hematologist in [place], [place] did extensive w/u; no cause found; failed oral supplement;; gets fairly regular (q18m or so) IV iron infusions (Venofer -iron sucrose- 200mg  with procrit.  "for 14 yr I've been getting blood  work q month & getting infusions prn" (07/12/2013).  Dr. Marin Olp locally, iron infusions done, EPO deficiency dx'd  . Migraine syndrome    "not as often anymore; used to be ~ q wk" (07/12/2013)  . Mixed incontinence urge and stress   . Nephrolithiasis    "passed all on my own or they are still in there" (07/12/2013)  . OSA on CPAP    prior to move to [place]--had another sleep study 10/2015 w/pulm Dr. Camillo Flaming.  . Osteoarthritis    "severe; progressing fast" (07/12/2013); multiple joints-not surgical candidate for TKR (03/2015)  . Pernicious anemia 08/24/2014  . Pleural plaque with presence of asbestos 07/22/2013  . Pulmonary embolism ([place]) 07/2013   Dx at [facility] with very small peripheral upper lobe pe 07/2013: pt took  coumadin for about 8-9 mo  . Pyelonephritis    "several times over the last 30 yr" (07/12/2013)  . RBBB (right bundle branch block)   . Recurrent UTI    hx of hospitalization for pyelonephritis; started abx prophylaxis 06/2015  . Syncope    Hypotensive; ED visit--Dr. Terrence Dupont did Cath--nonobstructive CAD, EF 55-60%.  In retrospect, suspect pt rx med misuse/polypharmacy  . Tension headache, chronic     Past Surgical History:  Procedure Laterality Date  . APPENDECTOMY  1960  . AXILLARY SURGERY Left 1978   Multiple "lump" in armpit per pt  . CARDIAC CATHETERIZATION  01/2013   nonobstructive CAD, EF 55-60%  . CARDIOVASCULAR STRESS TEST  02/22/15   Low risk myocard perf imaging; wall motion normal, normal EF  . COCCYX REMOVAL  1972  . DILATION AND CURETTAGE OF UTERUS  ? 1970's  . ESOPHAGOGASTRODUODENOSCOPY N/A 07/19/2014   Gastritis found + in the setting of supratherapeutic INR, +plavix, + meloxicam.  . EYE SURGERY Left 2012-2013   "injections for ~ 1 yr; don't really know what for" (07/12/2013)  . HEEL SPUR SURGERY Left 2008  . KNEE SURGERY  2005  . LEFT HEART CATHETERIZATION WITH CORONARY ANGIOGRAM N/A 01/30/2013   Procedure: LEFT HEART CATHETERIZATION WITH CORONARY ANGIOGRAM;  Surgeon: Clent Demark, MD;  Location: [facility] CATH LAB;  Service: Cardiovascular;  Laterality: N/A;  . PLANTAR FASCIA RELEASE Left 2008  . SPIROMETRY  04/25/14   In hosp for acute asthma/COPD flare: mixed obstructive and restrictive lung disease. The FEV1 is severely reduced at 45% predicted.  FEV1 signif decreased compared to prior spirometry 07/23/13.  . TENDON RELEASE  1996   Right forearm and hand  . TOTAL ABDOMINAL HYSTERECTOMY  1974  . TRANSTHORACIC ECHOCARDIOGRAM  01/2013; 04/2014;08/2015   2014--NORMAL.  2015--focal basal septal hypertrophy, EF 55-60%, grade I diast dysfxn, mild LAE.  08/2015 EF 55-60%, nl LV syst fxn, grade I DD, valves wnl    Current Outpatient Prescriptions  Medication Sig Dispense Refill  .  acetaminophen (TYLENOL) 325 MG tablet Take 2 tablets (650 mg total) by mouth every 4 (four) hours as needed for headache or mild pain.    [name] Kitchen albuterol (PROVENTIL HFA;VENTOLIN HFA) 108 (90 BASE) MCG/ACT inhaler Inhale 2 puffs into the lungs every 6 (six) hours as needed for wheezing or shortness of breath. 1 Inhaler 1  . ALPRAZolam (XANAX) 1 MG tablet Take 1 mg by mouth 3 (three) times daily as needed for anxiety.    . ARIPiprazole (ABILIFY) 10 MG tablet TAKE 1 TABLET ONCE DAILY. 30 tablet 2  . aspirin 81 MG tablet Take 81 mg by mouth at bedtime.    [name] Kitchen atorvastatin (LIPITOR) 20 MG tablet TAKE  1 TABLET EVERY DAY AT 6PM. 90 tablet 3  . budesonide-formoterol (SYMBICORT) 160-4.5 MCG/ACT inhaler Inhale 2 puffs into the lungs 2 (two) times daily. 1 Inhaler 12  . Cholecalciferol (VITAMIN D3) 50000 units CAPS Take 1 capsule by mouth every Monday.     . diclofenac sodium (VOLTAREN) 1 % GEL Apply 1 application topically 3 (three) times daily as needed (pain).     . DULoxetine (CYMBALTA) 30 MG capsule Take 30 mg by mouth daily.    . DULoxetine (CYMBALTA) 60 MG capsule TAKE 1 CAPSULE A DAY TO BE COMBINED WITH 30MG  CAPSULE 90 capsule 3  . folic acid (FOLVITE) 1 MG tablet Take 2 tablets (2 mg total) by mouth daily. 60 tablet 6  . furosemide (LASIX) 40 MG tablet Take 40 mg by mouth daily. Take 1 extra tablets for swelling    . ibuprofen (ADVIL,MOTRIN) 800 MG tablet Take 800 mg by mouth daily as needed (pain).    . isosorbide mononitrate (IMDUR) 30 MG 24 hr tablet TAKE 1 TABLET ONCE DAILY. 30 tablet 3  . LYRICA 300 MG capsule TAKE (1) CAPSULE TWICE DAILY. 60 capsule 0  . metoprolol succinate (TOPROL XL) 25 MG 24 hr tablet Take 1 tablet (25 mg total) by mouth daily. 30 tablet 11  . Multiple Vitamin (MULTIVITAMIN WITH MINERALS) TABS tablet Take 1 tablet by mouth daily.    . nitroGLYCERIN (NITROSTAT) 0.4 MG SL tablet Place 0.4 mg under the tongue every 5 (five) minutes as needed for chest pain (x 3 doses).     .  ondansetron (ZOFRAN-ODT) 4 MG disintegrating tablet Take 4 mg by mouth every 8 (eight) hours as needed for nausea or vomiting.    . Oxycodone HCl 10 MG TABS Take 1 tablet (10 mg total) by mouth every 6 (six) hours as needed. 100 tablet 0  . OXYGEN Inhale 3 L into the lungs continuous.    . pantoprazole (PROTONIX) 40 MG tablet TAKE 1 TABLET DAILY. 90 tablet 1  . polyethylene glycol (MIRALAX / GLYCOLAX) packet Take 17 g by mouth daily as needed (constipation). Mix in 8 oz liquid and drink    . potassium chloride SA (K-DUR,KLOR-CON) 20 MEQ tablet Take 2 tablets (40 meq) by mouth every morning and 1 tablet (20 meq) at night    . pregabalin (LYRICA) 100 MG capsule Take 100 mg by mouth daily. Take QAM    . traZODone (DESYREL) 50 MG tablet Take 50 mg by mouth at bedtime. INSOMNIA. Patient taking 2-4 tablets at bedtime .     No current facility-administered medications for this visit.    Facility-Administered Medications Ordered in Other Visits  Medication Dose Route Frequency Provider Last Rate Last Dose  . ferumoxytol (FERAHEME) 510 mg in sodium chloride 0.9 % 100 mL IVPB  510 mg Intravenous Once Volanda Napoleon, MD        Allergies:   Penicillins    Social History:  The patient  reports that she has never smoked. She has never used smokeless tobacco. She reports that she does not drink alcohol or use drugs.   Family History:  The patient's family history includes Arthritis in her mother; Diabetes in her father; Heart attack in her father and paternal grandmother; Heart disease in her father; Hypertension in her father; Kidney disease in her mother; Stroke in her father.    ROS:  Please see the history of present illness. All other systems are reviewed and negative.    PHYSICAL EXAM: VS:  BP  130/64   Pulse 74   Ht 5\' 4"  (1.626 m)   Wt 172 lb (78 kg)   BMI 29.52 kg/m  , BMI Body mass index is 29.52 kg/m. GEN: Well nourished, well developed, female in no acute distress  HEENT: normal for  age  Neck: minimal JVD, no carotid bruit, no masses Cardiac: RRR; no murmur, no rubs, or gallops Respiratory: Decreased breath sounds with basilar rales bilaterally, normal work of breathing GI: soft, nontender, nondistended, + BS MS: no deformity or atrophy; trace left pedal edema; distal pulses are 2+ in all 4 extremities   Skin: warm and dry, no rash Neuro:  Strength and sensation are intact Psych: euthymic mood, full affect   EKG:  EKG is not ordered today.  Recent Labs: 04/08/2016: Magnesium 2.0; TSH 1.002 05/30/2016: ALT 15; B Natriuretic Peptide 87.9 06/02/2016: Hemoglobin 12.4; Platelets 194 07/16/2016: BUN 10; Creat 0.99; Potassium 3.6; Sodium 146    Lipid Panel    Component Value Date/Time   CHOL 152 01/31/2013 0500   TRIG 92 01/31/2013 0500   HDL 44 01/31/2013 0500   CHOLHDL 3.5 01/31/2013 0500   VLDL 18 01/31/2013 0500   LDLCALC 90 01/31/2013 0500     Wt Readings from Last 3 Encounters:  08/28/16 172 lb (78 kg)  07/23/16 172 lb (78 kg)  06/27/16 177 lb 3.2 oz (80.4 kg)     Other studies Reviewed: Additional studies/ records that were reviewed today include: Office notes and previous testing  ASSESSMENT AND PLAN:  1.  Chronic diastolic CHF: Her weight is stable. She is encouraged to continue tracking her daily weights, taking extra Lasix when needed, and contacting us if she is not able to control her volume unaided. She benefits from close follow-up, but we will try to stretch to 2 months. Check a BMET today to follow her renal function.  Of note, she is leaving for the [place] of [place] tomorrow and [employer] may be going there as well. She is encouraged to make sure that she has low sodium food options, water to drink, and scales available so she can continue to track her weight while visiting family.   Current medicines are reviewed at length with the patient today.  The patient does not have concerns regarding medicines.  The following changes  have been made:  no change  Labs/ tests ordered today include:   Orders Placed This Encounter  Procedures  . BASIC METABOLIC PANEL WITH GFR     Disposition:   FU with Dr. Stanford Breed  Signed, Rosaria Ferries, PA-C  08/28/2016 5:02 PM    [facility] Phone: [phone]; Fax: [phone]  This note was written with the assistance of speech recognition software. Please excuse any transcriptional errors.

## 2016-09-12 ENCOUNTER — Encounter: Payer: Self-pay | Admitting: Registered Nurse

## 2016-09-12 ENCOUNTER — Encounter: Payer: Medicare Other | Attending: Physical Medicine & Rehabilitation | Admitting: Registered Nurse

## 2016-09-12 VITALS — BP 101/64 | HR 85

## 2016-09-12 DIAGNOSIS — M1711 Unilateral primary osteoarthritis, right knee: Secondary | ICD-10-CM | POA: Diagnosis not present

## 2016-09-12 DIAGNOSIS — I2510 Atherosclerotic heart disease of native coronary artery without angina pectoris: Secondary | ICD-10-CM

## 2016-09-12 DIAGNOSIS — M797 Fibromyalgia: Secondary | ICD-10-CM | POA: Insufficient documentation

## 2016-09-12 DIAGNOSIS — M5416 Radiculopathy, lumbar region: Secondary | ICD-10-CM | POA: Diagnosis not present

## 2016-09-12 DIAGNOSIS — G894 Chronic pain syndrome: Secondary | ICD-10-CM

## 2016-09-12 DIAGNOSIS — M1712 Unilateral primary osteoarthritis, left knee: Secondary | ICD-10-CM

## 2016-09-12 DIAGNOSIS — Z79899 Other long term (current) drug therapy: Secondary | ICD-10-CM | POA: Insufficient documentation

## 2016-09-12 DIAGNOSIS — R2689 Other abnormalities of gait and mobility: Secondary | ICD-10-CM | POA: Diagnosis not present

## 2016-09-12 DIAGNOSIS — Z5181 Encounter for therapeutic drug level monitoring: Secondary | ICD-10-CM | POA: Insufficient documentation

## 2016-09-12 MED ORDER — OXYCODONE HCL 10 MG PO TABS: 10.0000 mg | ORAL_TABLET | Freq: Four times a day (QID) | ORAL | 0 refills | Status: DC

## 2016-09-12 NOTE — Progress Notes (Signed)
Subjective:    Patient ID: Linda [name], female    DOB: [birth year], 70 y.o.   MRN: [id number]  HPI: [name] is a 70 year old female who returns for follow up appointmentfor chronic pain and medication refill. She states her pain is located in herlower back radiating into her lower extremitesposterioraly, bilateral hip pain and bilateral knee pain. She rates her pain 7. Her current exercise regime is performing stretching exercises, stationary peddle bicycle for upper and lower extremities ( upper for 10 minutes and lower for 5 minutes) every other day, also using the Nu-Step three days a week and walking.  Pain Inventory Average Pain 7 Pain Right Now 7 My pain is constant, sharp, burning and aching  In the last 24 hours, has pain interfered with the following? General activity 9 Relation with others 9 Enjoyment of life 10 What TIME of day is your pain at its worst? n/a Sleep (in general) NA  Pain is worse with: n/a Pain improves with: n/a Relief from Meds: n/a  Mobility walk with assistance use a walker how many minutes can you walk? 10 ability to climb steps?  no do you drive?  yes Do you have any goals in this area?  yes  Function retired I need assistance with the following:  meal prep, household duties and shopping Do you have any goals in this area?  yes  Neuro/Psych bladder control problems weakness trouble walking spasms anxiety  Prior Studies Any changes since last visit?  no  Physicians involved in your care Any changes since last visit?  no Primary care Dr. Thornell Sartorius.   Family History  Problem Relation Age of Onset  . Arthritis Mother   . Kidney disease Mother   . Heart disease Father   . Stroke Father   . Hypertension Father   . Diabetes Father   . Heart attack Father   . Heart attack Paternal Grandmother    Social History   Social History  . Marital status: Widowed    Spouse name: N/A  . Number  of children: 2  . Years of education: N/A   Occupational History  . Retired    Social History Main Topics  . Smoking status: Never Smoker  . Smokeless tobacco: Never Used     Comment: never used tobacco  . Alcohol use No  . Drug use: No  . Sexual activity: Not Currently   Other Topics Concern  . Not on file   Social History Narrative   Widowed, 2 sons.  Relocated to [place] 09/2012 to be closer to her son who has MS.   Husband d 2015--mesothelioma.   Occupation: former Pharmacist, hospital.   Education: masters degree level.   No T/A/Ds.   Lives in [place], independent living.   Past Surgical History:  Procedure Laterality Date  . APPENDECTOMY  1960  . AXILLARY SURGERY Left 1978   Multiple "lump" in armpit per pt  . CARDIAC CATHETERIZATION  01/2013   nonobstructive CAD, EF 55-60%  . CARDIOVASCULAR STRESS TEST  02/22/15   Low risk myocard perf imaging; wall motion normal, normal EF  . COCCYX REMOVAL  1972  . DILATION AND CURETTAGE OF UTERUS  ? 1970's  . ESOPHAGOGASTRODUODENOSCOPY N/A 07/19/2014   Gastritis found + in the setting of supratherapeutic INR, +plavix, + meloxicam.  . EYE SURGERY Left 2012-2013   "injections for ~ 1 yr; don't really know what for" (07/12/2013)  . HEEL SPUR SURGERY Left 2008  .  KNEE SURGERY  2005  . LEFT HEART CATHETERIZATION WITH CORONARY ANGIOGRAM N/A 01/30/2013   Procedure: LEFT HEART CATHETERIZATION WITH CORONARY ANGIOGRAM;  Surgeon: Clent Demark, MD;  Location: [facility] CATH LAB;  Service: Cardiovascular;  Laterality: N/A;  . PLANTAR FASCIA RELEASE Left 2008  . SPIROMETRY  04/25/14   In hosp for acute asthma/COPD flare: mixed obstructive and restrictive lung disease. The FEV1 is severely reduced at 45% predicted.  FEV1 signif decreased compared to prior spirometry 07/23/13.  . TENDON RELEASE  1996   Right forearm and hand  . TOTAL ABDOMINAL HYSTERECTOMY  1974  . TRANSTHORACIC ECHOCARDIOGRAM  01/2013; 04/2014;08/2015   2014--NORMAL.  2015--focal basal septal  hypertrophy, EF 55-60%, grade I diast dysfxn, mild LAE.  08/2015 EF 55-60%, nl LV syst fxn, grade I DD, valves wnl   Past Medical History:  Diagnosis Date  . Acute upper GI bleed 06/2014   while pt taking coumadin, plavix, and meloxicam---despite being told not to take coumadin.  . Anginal pain ([place])    Nonobstructive CAD 2014; however, her cardiologist put her on a statin for this and NOT for hyperlipidemia per pt report.  . Anxiety    panic attacks  . Asthma    w/ asbestososis   . BPPV (benign paroxysmal positional vertigo) 12/16/2012  . Chronic lower back pain   . COPD (chronic obstructive pulmonary disease) ([place])   . DDD (degenerative disc disease)    lumbar and cervical.   . Depression   . Diastolic congestive heart failure ([place])    zaroxolyn 2.5mg  q week prn dose added 06/27/16 by cardiology  . Diverticular disease   . Fibromyalgia    Patient states dx was around her late 44s but she had sx's for years prior to this.  . H/O hiatal hernia   . Hay fever   . History of pneumonia    hospitalized 12/2011, 02/2013, and 07/2013 [facility]) for this  . HTN (hypertension)   . Idiopathic angio-edema-urticaria 72014   Angioedema component was very minimal  . Insomnia   . Iron deficiency anemia    Hematologist in [place], [place] did [place] w/u; no cause found; failed oral supplement;; gets fairly regular (q93m or so) IV iron infusions (Venofer -iron sucrose- 200mg  with procrit.  "for 14 yr I've been getting blood work q month & getting infusions prn" (07/12/2013).  Dr. Marin Olp locally, iron infusions done, EPO deficiency dx'd  . Migraine syndrome    "not as often anymore; used to be ~ q wk" (07/12/2013)  . Mixed incontinence urge and stress   . Nephrolithiasis    "passed all on my own or they are still in there" (07/12/2013)  . OSA on CPAP    prior to move to [place]--had another sleep study 10/2015 w/pulm Dr. Camillo Flaming.  . Osteoarthritis    "severe; progressing fast" (07/12/2013); multiple joints-not  surgical candidate for TKR (03/2015)  . Pernicious anemia 08/24/2014  . Pleural plaque with presence of asbestos 07/22/2013  . Pulmonary embolism ([place]) 07/2013   Dx at [facility] with very small peripheral upper lobe pe 07/2013: pt took coumadin for about 8-9 mo  . Pyelonephritis    "several times over the last 30 yr" (07/12/2013)  . RBBB (right bundle branch block)   . Recurrent UTI    hx of hospitalization for pyelonephritis; started abx prophylaxis 06/2015  . Syncope    Hypotensive; ED visit--Dr. Terrence Dupont did Cath--nonobstructive CAD, EF 55-60%.  In retrospect, suspect pt rx med misuse/polypharmacy  . Tension headache,  chronic    There were no vitals taken for this visit.  Opioid Risk Score:   Fall Risk Score:  `1  Depression screen PHQ 2/9  Depression screen [facility] 2/9 08/01/2016 06/12/2016 04/19/2016 08/11/2015 07/12/2015 03/27/2015 03/31/2014  Decreased Interest 3 1 0 3 0 3 0  Down, Depressed, Hopeless 3 0 0 3 1 3  0  PHQ - 2 Score 6 1 0 6 1 6  0  Altered sleeping - - - 2 - 2 -  Tired, decreased energy - - - 3 - 3 -  Change in appetite - - - 0 - 2 -  Feeling bad or failure about yourself  - - - 2 - 2 -  Trouble concentrating - - - 0 - 2 -  Moving slowly or fidgety/restless - - - 0 - 0 -  Suicidal thoughts - - - 0 - 0 -  PHQ-9 Score - - - 13 - 17 -  Some recent data might be hidden      Review of Systems  Genitourinary:       Bladder problems  Neurological: Positive for weakness.       Spasms Trouble walking  Psychiatric/Behavioral: The patient is nervous/anxious.   All other systems reviewed and are negative.      Objective:   Physical Exam  Constitutional: She is oriented to person, place, and time. She appears well-developed and well-nourished.  HENT:  Head: Normocephalic and atraumatic.  Neck: Normal range of motion. Neck supple.  Cardiovascular: Normal rate and regular rhythm.   Pulmonary/Chest: Effort normal and breath sounds normal.  Musculoskeletal:  Normal Muscle Bulk and  Muscle Testing Reveals: Upper Extremities: Full ROM and Muscle Strength 5/5 Lumbar Hypersensitivity Lower Extremities: Decreased ROM and Muscle Strength 5/5 Bilateral Lower Extremities Flexion Produces Pain into Patella's Arises from chair slowly using walker for support Antalgic Gait  Neurological: She is alert and oriented to person, place, and time.  Skin: Skin is warm and dry.  Psychiatric: She has a normal mood and affect.  Nursing note and vitals reviewed.         Assessment & Plan:  1. Functional deficits secondary to Gait disorder: Continue with HEP. 2. Chronic Back pain/ Lumbar Radiculitis/fibromyalgia/R>L Knee OA Pain Management:  Refilled: Oxycodone 10 mg one tablet every 6 hours #100/ Continue Lyrica and Voltaren Gel We will continue the opioid monitoring program, this consists of regular clinic visits, examinations, urine drug screen, pill counts as well as use of [place] Controlled Substance reporting System.  3. Depression with anxiety/Grief reaction/Mood: Continue Trazodone and Cymbalta .  4. [name] with asthma: Oxygen dependent. Albuterol prn.  On Continuous Oxygen Therapy: Not wearing Oxygen today. Usually uses  3 liters [facility] Following. 5. Bilateral Osteoarthritis of Bilateral Knees: Dr. Juanda Bond Following. 6. Fibromyalgia: Continue Lyrica andexercise regime.  20 minutes of face to face patient care time was spent during this visit. All questions were encouraged and answered.

## 2016-09-18 ENCOUNTER — Telehealth (HOSPITAL_COMMUNITY): Payer: Self-pay | Admitting: Cardiology

## 2016-09-18 NOTE — Telephone Encounter (Signed)
Spoke to patient . She states weight has increase over the last days. She states she feels uncomfortable. Weight gain in her abdomen not legs.  No chest pain   not taken blood pressure + for shortness of breath  ,no notice with rapid heart rate Today's weight 177 lbs Yesterday weight 176.5 lbs             09/16/16    175.5 lbs             09/15/16     173.0 lbs  09/14/16      170  LBS  9//22/17      169 LBS Patient states she has been taking an extra 40 mg lasix for the last 5 days.  She has not taken extra et today she states she will take @ 1 pm today.  RN in informed patient to take extra lasix , will defer to Dr Stanford Breed ,   if appointment is needed patient states she can come tomorrow ,she will not be able to get a ride today

## 2016-09-18 NOTE — Telephone Encounter (Signed)
Appointment schedule for tomorrow 09/19/16 AT 2 PM with [facility]- will make arrangement with facility's transport

## 2016-09-18 NOTE — Telephone Encounter (Signed)
New message    Patient calling C/O weight today  177   Pt C/O Shortness Of Breath: STAT if SOB developed within the last 24 hours or pt is noticeably SOB on the phone  1. Are you currently SOB (can you hear that pt is SOB on the phone)? Yes   2. How long have you been experiencing SOB? Several days   3. Are you SOB when sitting or when up moving around? Both   4. Are you currently experiencing any other symptoms? Swelling in feet.

## 2016-09-18 NOTE — Telephone Encounter (Signed)
Agree with paov Brian Crenshaw  

## 2016-09-18 NOTE — Telephone Encounter (Signed)
Patient aware.

## 2016-09-19 ENCOUNTER — Encounter: Payer: Self-pay | Admitting: Physician Assistant

## 2016-09-19 ENCOUNTER — Inpatient Hospital Stay (HOSPITAL_COMMUNITY)
Admission: AD | Admit: 2016-09-19 | Discharge: 2016-09-23 | DRG: 292 | Disposition: A | Discharge status: Home / Self Care | Payer: Medicare Other | Source: Ambulatory Visit | Attending: Cardiovascular Disease | Admitting: Cardiovascular Disease

## 2016-09-19 ENCOUNTER — Ambulatory Visit (INDEPENDENT_AMBULATORY_CARE_PROVIDER_SITE_OTHER): Payer: Medicare Other | Admitting: Cardiovascular Disease

## 2016-09-19 VITALS — BP 120/70 | HR 90 | Ht 64.0 in | Wt 177.0 lb

## 2016-09-19 DIAGNOSIS — I509 Heart failure, unspecified: Secondary | ICD-10-CM | POA: Diagnosis not present

## 2016-09-19 DIAGNOSIS — I5033 Acute on chronic diastolic (congestive) heart failure: Secondary | ICD-10-CM | POA: Diagnosis not present

## 2016-09-19 DIAGNOSIS — Z86711 Personal history of pulmonary embolism: Secondary | ICD-10-CM | POA: Diagnosis not present

## 2016-09-19 DIAGNOSIS — G894 Chronic pain syndrome: Secondary | ICD-10-CM | POA: Diagnosis present

## 2016-09-19 DIAGNOSIS — I4510 Unspecified right bundle-branch block: Secondary | ICD-10-CM | POA: Diagnosis present

## 2016-09-19 DIAGNOSIS — I951 Orthostatic hypotension: Secondary | ICD-10-CM | POA: Diagnosis present

## 2016-09-19 DIAGNOSIS — R011 Cardiac murmur, unspecified: Secondary | ICD-10-CM | POA: Diagnosis present

## 2016-09-19 DIAGNOSIS — M5136 Other intervertebral disc degeneration, lumbar region: Secondary | ICD-10-CM | POA: Diagnosis present

## 2016-09-19 DIAGNOSIS — Z79899 Other long term (current) drug therapy: Secondary | ICD-10-CM | POA: Diagnosis not present

## 2016-09-19 DIAGNOSIS — J9610 Chronic respiratory failure, unspecified whether with hypoxia or hypercapnia: Secondary | ICD-10-CM | POA: Diagnosis present

## 2016-09-19 DIAGNOSIS — I110 Hypertensive heart disease with heart failure: Secondary | ICD-10-CM | POA: Diagnosis present

## 2016-09-19 DIAGNOSIS — G4733 Obstructive sleep apnea (adult) (pediatric): Secondary | ICD-10-CM | POA: Diagnosis present

## 2016-09-19 DIAGNOSIS — Z880 Allergy status to penicillin: Secondary | ICD-10-CM

## 2016-09-19 DIAGNOSIS — I2510 Atherosclerotic heart disease of native coronary artery without angina pectoris: Secondary | ICD-10-CM | POA: Diagnosis present

## 2016-09-19 DIAGNOSIS — M797 Fibromyalgia: Secondary | ICD-10-CM | POA: Diagnosis present

## 2016-09-19 DIAGNOSIS — Z7982 Long term (current) use of aspirin: Secondary | ICD-10-CM | POA: Diagnosis not present

## 2016-09-19 DIAGNOSIS — M5030 Other cervical disc degeneration, unspecified cervical region: Secondary | ICD-10-CM | POA: Diagnosis present

## 2016-09-19 DIAGNOSIS — R079 Chest pain, unspecified: Secondary | ICD-10-CM | POA: Diagnosis not present

## 2016-09-19 DIAGNOSIS — G8929 Other chronic pain: Secondary | ICD-10-CM | POA: Diagnosis present

## 2016-09-19 DIAGNOSIS — R0602 Shortness of breath: Secondary | ICD-10-CM | POA: Diagnosis not present

## 2016-09-19 DIAGNOSIS — D509 Iron deficiency anemia, unspecified: Secondary | ICD-10-CM | POA: Diagnosis present

## 2016-09-19 DIAGNOSIS — I5032 Chronic diastolic (congestive) heart failure: Secondary | ICD-10-CM | POA: Diagnosis present

## 2016-09-19 DIAGNOSIS — F419 Anxiety disorder, unspecified: Secondary | ICD-10-CM | POA: Diagnosis present

## 2016-09-19 DIAGNOSIS — J449 Chronic obstructive pulmonary disease, unspecified: Secondary | ICD-10-CM | POA: Diagnosis present

## 2016-09-19 DIAGNOSIS — E785 Hyperlipidemia, unspecified: Secondary | ICD-10-CM | POA: Diagnosis present

## 2016-09-19 DIAGNOSIS — M1990 Unspecified osteoarthritis, unspecified site: Secondary | ICD-10-CM | POA: Diagnosis present

## 2016-09-19 LAB — HEPATIC FUNCTION PANEL
ALBUMIN: 3.9 g/dL (ref 3.5–5.0)
ALT: 15 U/L (ref 14–54)
AST: 21 U/L (ref 15–41)
Alkaline Phosphatase: 72 U/L (ref 38–126)
Bilirubin, Direct: <0.1 mg/dL — ABNORMAL LOW (ref 0.1–0.5)
TOTAL PROTEIN: 6.9 g/dL (ref 6.5–8.1)
Total Bilirubin: 1 mg/dL (ref 0.3–1.2)

## 2016-09-19 LAB — BASIC METABOLIC PANEL
ANION GAP: 11 (ref 5–15)
BUN: 9 mg/dL (ref 6–20)
CALCIUM: 9.4 mg/dL (ref 8.9–10.3)
CO2: 28 mmol/L (ref 22–32)
Chloride: 100 mmol/L — ABNORMAL LOW (ref 101–111)
Creatinine, Ser: 0.85 mg/dL (ref 0.44–1.00)
Glucose, Bld: 87 mg/dL (ref 65–99)
POTASSIUM: 3.2 mmol/L — AB (ref 3.5–5.1)
Sodium: 139 mmol/L (ref 135–145)

## 2016-09-19 LAB — CBC
HCT: 39.8 % (ref 36.0–46.0)
Hemoglobin: 12.8 g/dL (ref 12.0–15.0)
MCH: 29 pg (ref 26.0–34.0)
MCHC: 32.2 g/dL (ref 30.0–36.0)
MCV: 90.2 fL (ref 78.0–100.0)
PLATELETS: 220 10*3/uL (ref 150–400)
RBC: 4.41 MIL/uL (ref 3.87–5.11)
RDW: 12.8 % (ref 11.5–15.5)
WBC: 8 10*3/uL (ref 4.0–10.5)

## 2016-09-19 LAB — BRAIN NATRIURETIC PEPTIDE: B NATRIURETIC PEPTIDE 5: 14.1 pg/mL (ref 0.0–100.0)

## 2016-09-19 LAB — TROPONIN I: Troponin I: <0.03 ng/mL (ref <0.03)

## 2016-09-19 MED ORDER — PREGABALIN 100 MG PO CAPS
300.0000 mg | ORAL_CAPSULE | Freq: Every day | ORAL | Status: DC
  Administered 2016-09-20 – 2016-09-22 (×4): 300 mg via ORAL
  Filled 2016-09-19 (×4): qty 3

## 2016-09-19 MED ORDER — OXYCODONE HCL 5 MG PO TABS
10.0000 mg | ORAL_TABLET | Freq: Four times a day (QID) | ORAL | Status: DC
  Administered 2016-09-19 – 2016-09-23 (×15): 10 mg via ORAL
  Filled 2016-09-19 (×15): qty 2

## 2016-09-19 MED ORDER — ARIPIPRAZOLE 5 MG PO TABS
10.0000 mg | ORAL_TABLET | Freq: Every day | ORAL | Status: DC
  Administered 2016-09-20 – 2016-09-23 (×4): 10 mg via ORAL
  Filled 2016-09-19 (×4): qty 2

## 2016-09-19 MED ORDER — ASPIRIN EC 81 MG PO TBEC
81.0000 mg | DELAYED_RELEASE_TABLET | Freq: Every day | ORAL | Status: DC
  Administered 2016-09-20 – 2016-09-22 (×4): 81 mg via ORAL
  Filled 2016-09-19 (×4): qty 1

## 2016-09-19 MED ORDER — FUROSEMIDE 10 MG/ML IJ SOLN
INTRAMUSCULAR | Status: AC
  Filled 2016-09-19: qty 4

## 2016-09-19 MED ORDER — SODIUM CHLORIDE 0.9% FLUSH: 3.0000 mL | INTRAVENOUS | Status: DC | PRN

## 2016-09-19 MED ORDER — ALPRAZOLAM 0.5 MG PO TABS
1.0000 mg | ORAL_TABLET | Freq: Three times a day (TID) | ORAL | Status: DC | PRN
  Administered 2016-09-20: 1 mg via ORAL
  Filled 2016-09-19: qty 2

## 2016-09-19 MED ORDER — SODIUM CHLORIDE 0.9% FLUSH
3.0000 mL | Freq: Two times a day (BID) | INTRAVENOUS | Status: DC
  Administered 2016-09-19 – 2016-09-23 (×8): 3 mL via INTRAVENOUS

## 2016-09-19 MED ORDER — POTASSIUM CHLORIDE CRYS ER 20 MEQ PO TBCR
40.0000 meq | EXTENDED_RELEASE_TABLET | Freq: Two times a day (BID) | ORAL | Status: DC
  Administered 2016-09-20 – 2016-09-23 (×8): 40 meq via ORAL
  Filled 2016-09-19 (×8): qty 2

## 2016-09-19 MED ORDER — MOMETASONE FURO-FORMOTEROL FUM 200-5 MCG/ACT IN AERO
2.0000 | INHALATION_SPRAY | Freq: Two times a day (BID) | RESPIRATORY_TRACT | Status: DC
  Administered 2016-09-20 – 2016-09-22 (×6): 2 via RESPIRATORY_TRACT
  Filled 2016-09-19 (×2): qty 8.8

## 2016-09-19 MED ORDER — FUROSEMIDE 10 MG/ML IJ SOLN
40.0000 mg | Freq: Two times a day (BID) | INTRAMUSCULAR | Status: DC
  Administered 2016-09-19 – 2016-09-22 (×6): 40 mg via INTRAVENOUS

## 2016-09-19 MED ORDER — ENOXAPARIN SODIUM 40 MG/0.4ML ~~LOC~~ SOLN
40.0000 mg | SUBCUTANEOUS | Status: DC
  Administered 2016-09-20 – 2016-09-22 (×4): 40 mg via SUBCUTANEOUS
  Filled 2016-09-19 (×4): qty 0.4

## 2016-09-19 MED ORDER — ENOXAPARIN SODIUM 40 MG/0.4ML ~~LOC~~ SOLN
40.0000 mg | SUBCUTANEOUS | Status: DC
Start: 2016-09-19 — End: 2016-09-19

## 2016-09-19 MED ORDER — ADULT MULTIVITAMIN W/MINERALS CH
1.0000 | ORAL_TABLET | Freq: Every day | ORAL | Status: DC
  Administered 2016-09-20 – 2016-09-22 (×3): 1 via ORAL
  Filled 2016-09-19: qty 1

## 2016-09-19 MED ORDER — DULOXETINE HCL 60 MG PO CPEP
90.0000 mg | ORAL_CAPSULE | Freq: Every day | ORAL | Status: DC
  Administered 2016-09-20 – 2016-09-22 (×4): 90 mg via ORAL
  Filled 2016-09-19 (×4): qty 1

## 2016-09-19 MED ORDER — ONDANSETRON HCL 4 MG/2ML IJ SOLN: 4.0000 mg | Freq: Four times a day (QID) | INTRAMUSCULAR | Status: DC | PRN

## 2016-09-19 MED ORDER — ZOLPIDEM TARTRATE 5 MG PO TABS: 5.0000 mg | ORAL_TABLET | Freq: Every evening | ORAL | Status: DC | PRN

## 2016-09-19 MED ORDER — ATORVASTATIN CALCIUM 20 MG PO TABS
20.0000 mg | ORAL_TABLET | Freq: Every day | ORAL | Status: DC
  Administered 2016-09-20 – 2016-09-22 (×4): 20 mg via ORAL
  Filled 2016-09-19 (×3): qty 1

## 2016-09-19 MED ORDER — TRAZODONE HCL 50 MG PO TABS: 50.0000 mg | ORAL_TABLET | Freq: Every day | ORAL | Status: DC

## 2016-09-19 MED ORDER — PREGABALIN 100 MG PO CAPS: 300.0000 mg | ORAL_CAPSULE | Freq: Two times a day (BID) | ORAL | Status: DC

## 2016-09-19 MED ORDER — SODIUM CHLORIDE 0.9 % IV SOLN: 250.0000 mL | INTRAVENOUS | Status: DC | PRN

## 2016-09-19 MED ORDER — ALPRAZOLAM 0.25 MG PO TABS: 0.2500 mg | ORAL_TABLET | Freq: Two times a day (BID) | ORAL | Status: DC | PRN

## 2016-09-19 MED ORDER — TRAZODONE HCL 100 MG PO TABS
100.0000 mg | ORAL_TABLET | Freq: Every day | ORAL | Status: DC
  Administered 2016-09-20: 100 mg via ORAL
  Filled 2016-09-19: qty 1

## 2016-09-19 MED ORDER — ACETAMINOPHEN 325 MG PO TABS
650.0000 mg | ORAL_TABLET | ORAL | Status: DC | PRN
  Filled 2016-09-19: qty 2

## 2016-09-19 MED ORDER — ISOSORBIDE MONONITRATE ER 30 MG PO TB24
30.0000 mg | ORAL_TABLET | Freq: Every day | ORAL | Status: DC
  Administered 2016-09-20 – 2016-09-23 (×4): 30 mg via ORAL
  Filled 2016-09-19 (×4): qty 1

## 2016-09-19 MED ORDER — FOLIC ACID 1 MG PO TABS
2.0000 mg | ORAL_TABLET | Freq: Every day | ORAL | Status: DC
  Administered 2016-09-20 – 2016-09-23 (×4): 2 mg via ORAL
  Filled 2016-09-19 (×5): qty 2

## 2016-09-19 MED ORDER — DICLOFENAC SODIUM 1 % TD GEL
1.0000 "application " | Freq: Three times a day (TID) | TRANSDERMAL | Status: DC | PRN
  Filled 2016-09-19: qty 100

## 2016-09-19 MED ORDER — METOPROLOL SUCCINATE ER 25 MG PO TB24
25.0000 mg | ORAL_TABLET | Freq: Every day | ORAL | Status: DC
  Administered 2016-09-21 – 2016-09-23 (×3): 25 mg via ORAL
  Filled 2016-09-19 (×3): qty 1

## 2016-09-19 MED ORDER — PREGABALIN 100 MG PO CAPS: 100.0000 mg | ORAL_CAPSULE | Freq: Every day | ORAL | Status: DC

## 2016-09-19 MED ORDER — ALBUTEROL SULFATE (2.5 MG/3ML) 0.083% IN NEBU: 3.0000 mL | INHALATION_SOLUTION | Freq: Four times a day (QID) | RESPIRATORY_TRACT | Status: DC | PRN

## 2016-09-19 MED ORDER — PANTOPRAZOLE SODIUM 40 MG PO TBEC
40.0000 mg | DELAYED_RELEASE_TABLET | Freq: Every day | ORAL | Status: DC
  Administered 2016-09-20 – 2016-09-23 (×4): 40 mg via ORAL
  Filled 2016-09-19 (×4): qty 1

## 2016-09-19 MED ORDER — POLYETHYLENE GLYCOL 3350 17 G PO PACK: 17.0000 g | PACK | Freq: Every day | ORAL | Status: DC | PRN

## 2016-09-19 MED ORDER — NITROGLYCERIN 0.4 MG SL SUBL: 0.4000 mg | SUBLINGUAL_TABLET | SUBLINGUAL | Status: DC | PRN

## 2016-09-19 NOTE — H&P (Signed)
Cardiology History and Physical   Date:  09/19/2016   ID:  Linda [name], DOB [birth year], MRN [id number]  PCP:  Tammi Sou, MD  Cardiologist:  Dr Dineen Kid, Suanne Marker, PA-C   History of Present Illness: Linda [name] is a 70 y.o. female with a history of  COPD on O2 3 lpm, HTN, ?orthostatic syncope, D-CHF, mod CAD by cath 2014 w/ med rx, fibromyalgia w/ chronic pain issues. RBBB and inf Q waves on ECG.  Seen in office early August and early September, wt stable, she was taking prn Lasix.   [name] presents for evaluation and treatment of her CHF.  She went on vacation with her family and did not gain any weight. She stated her weight was 169 pounds when she got back. However, since then, her weight is begun to trend up. She denies dietary indiscretions, but states that her weight has been steadily trending up. She is now almost 10 pounds above her dry weight. In association with the increase in her weight, she has noticed increasing lower extremity edema and orthopnea. She has been on oxygen at 3 L which is normal for her, but has noticed her oxygen saturations at home dropping lower than usual, into the 80s at times.  She has taken the extra Lasix as directed for 3 days, without any improvement in her weight or in her symptoms.  Today, she is also complaining of chest pressure. She cannot say exactly when it started. She is very concerned about the chest pressure. It is greater than a 5/10. He gets a little worse with deep inspiration.  She has not had palpitations or presyncope.   Past Medical History:  Diagnosis Date  . Acute upper GI bleed 06/2014   while pt taking coumadin, plavix, and meloxicam---despite being told not to take coumadin.  . Anginal pain ([place])    Nonobstructive CAD 2014; however, her cardiologist put her on a statin for this and NOT for hyperlipidemia per pt report.  . Anxiety    panic attacks  . Asthma    w/ asbestososis   .  BPPV (benign paroxysmal positional vertigo) 12/16/2012  . Chronic lower back pain   . COPD (chronic obstructive pulmonary disease) ([place])   . DDD (degenerative disc disease)    lumbar and cervical.   . Depression   . Diastolic congestive heart failure ([place])    zaroxolyn 2.5mg  q week prn dose added 06/27/16 by cardiology  . Diverticular disease   . Fibromyalgia    Patient states dx was around her late 78s but she had sx's for years prior to this.  . H/O hiatal hernia   . Hay fever   . History of pneumonia    hospitalized 12/2011, 02/2013, and 07/2013 [facility]) for this  . HTN (hypertension)   . Idiopathic angio-edema-urticaria 72014   Angioedema component was very minimal  . Insomnia   . Iron deficiency anemia    Hematologist in [place], [place] did extensive w/u; no cause found; failed oral supplement;; gets fairly regular (q27m or so) IV iron infusions (Venofer -iron sucrose- 200mg  with procrit.  "for 14 yr I've been getting blood work q month & getting infusions prn" (07/12/2013).  Dr. Marin Olp locally, iron infusions done, EPO deficiency dx'd  . Migraine syndrome    "not as often anymore; used to be ~ q wk" (07/12/2013)  . Mixed incontinence urge and stress   . Nephrolithiasis    "passed all on my own  or they are still in there" (07/12/2013)  . OSA on CPAP    prior to move to [place]--had another sleep study 10/2015 w/pulm Dr. Camillo Flaming.  . Osteoarthritis    "severe; progressing fast" (07/12/2013); multiple joints-not surgical candidate for TKR (03/2015)  . Pernicious anemia 08/24/2014  . Pleural plaque with presence of asbestos 07/22/2013  . Pulmonary embolism ([place]) 07/2013   Dx at [facility] with very small peripheral upper lobe pe 07/2013: pt took coumadin for about 8-9 mo  . Pyelonephritis    "several times over the last 30 yr" (07/12/2013)  . RBBB (right bundle branch block)   . Recurrent UTI    hx of hospitalization for pyelonephritis; started abx prophylaxis 06/2015  . Syncope    Hypotensive; ED visit--Dr.  Terrence Dupont did Cath--nonobstructive CAD, EF 55-60%.  In retrospect, suspect pt rx med misuse/polypharmacy  . Tension headache, chronic     Past Surgical History:  Procedure Laterality Date  . APPENDECTOMY  1960  . AXILLARY SURGERY Left 1978   Multiple "lump" in armpit per pt  . CARDIAC CATHETERIZATION  01/2013   nonobstructive CAD, EF 55-60%  . CARDIOVASCULAR STRESS TEST  02/22/15   Low risk myocard perf imaging; wall motion normal, normal EF  . COCCYX REMOVAL  1972  . DILATION AND CURETTAGE OF UTERUS  ? 1970's  . ESOPHAGOGASTRODUODENOSCOPY N/A 07/19/2014   Gastritis found + in the setting of supratherapeutic INR, +plavix, + meloxicam.  . EYE SURGERY Left 2012-2013   "injections for ~ 1 yr; don't really know what for" (07/12/2013)  . HEEL SPUR SURGERY Left 2008  . KNEE SURGERY  2005  . LEFT HEART CATHETERIZATION WITH CORONARY ANGIOGRAM N/A 01/30/2013   Procedure: LEFT HEART CATHETERIZATION WITH CORONARY ANGIOGRAM;  Surgeon: Clent Demark, MD;  Location: [facility] CATH LAB;  Service: Cardiovascular;  Laterality: N/A;  . PLANTAR FASCIA RELEASE Left 2008  . SPIROMETRY  04/25/14   In hosp for acute asthma/COPD flare: mixed obstructive and restrictive lung disease. The FEV1 is severely reduced at 45% predicted.  FEV1 signif decreased compared to prior spirometry 07/23/13.  . TENDON RELEASE  1996   Right forearm and hand  . TOTAL ABDOMINAL HYSTERECTOMY  1974  . TRANSTHORACIC ECHOCARDIOGRAM  01/2013; 04/2014;08/2015   2014--NORMAL.  2015--focal basal septal hypertrophy, EF 55-60%, grade I diast dysfxn, mild LAE.  08/2015 EF 55-60%, nl LV syst fxn, grade I DD, valves wnl    Current Outpatient Prescriptions  Medication Sig Dispense Refill  . acetaminophen (TYLENOL) 325 MG tablet Take 2 tablets (650 mg total) by mouth every 4 (four) hours as needed for headache or mild pain.    [name] Kitchen albuterol (PROVENTIL HFA;VENTOLIN HFA) 108 (90 BASE) MCG/ACT inhaler Inhale 2 puffs into the lungs every 6 (six) hours as needed for  wheezing or shortness of breath. 1 Inhaler 1  . ALPRAZolam (XANAX) 1 MG tablet Take 1 mg by mouth 3 (three) times daily as needed for anxiety.    . ARIPiprazole (ABILIFY) 10 MG tablet Take 10 mg by mouth daily.    [name] Kitchen aspirin 81 MG tablet Take 81 mg by mouth at bedtime.    [name] Kitchen atorvastatin (LIPITOR) 20 MG tablet Take 20 mg by mouth daily.    . budesonide-formoterol (SYMBICORT) 160-4.5 MCG/ACT inhaler Inhale 2 puffs into the lungs 2 (two) times daily. 1 Inhaler 12  . Cholecalciferol (VITAMIN D3) 50000 units CAPS Take 1 capsule by mouth every Monday.     . diclofenac sodium (VOLTAREN) 1 % GEL Apply 1  application topically 3 (three) times daily as needed (pain).     . DULoxetine (CYMBALTA) 30 MG capsule Take 30 mg by mouth daily.    . DULoxetine (CYMBALTA) 60 MG capsule Take 60 mg by mouth daily. TO BE COMBINED WITH 30 MG TAB TO EQUAL 90 MG    . folic acid (FOLVITE) 1 MG tablet Take 2 tablets (2 mg total) by mouth daily. 60 tablet 6  . furosemide (LASIX) 40 MG tablet Take 40 mg by mouth daily. Take 1 extra tablets for swelling    . ibuprofen (ADVIL,MOTRIN) 800 MG tablet Take 800 mg by mouth daily as needed (pain).    . isosorbide mononitrate (IMDUR) 30 MG 24 hr tablet Take 30 mg by mouth daily.    . metoprolol succinate (TOPROL XL) 25 MG 24 hr tablet Take 1 tablet (25 mg total) by mouth daily. 30 tablet 11  . Multiple Vitamin (MULTIVITAMIN WITH MINERALS) TABS tablet Take 1 tablet by mouth daily.    . nitroGLYCERIN (NITROSTAT) 0.4 MG SL tablet Place 0.4 mg under the tongue every 5 (five) minutes as needed for chest pain (x 3 doses).     . ondansetron (ZOFRAN-ODT) 4 MG disintegrating tablet Take 4 mg by mouth every 8 (eight) hours as needed for nausea or vomiting.    . Oxycodone HCl 10 MG TABS Take 1 tablet (10 mg total) by mouth every 6 (six) hours. 100 tablet 0  . OXYGEN Inhale 3 L into the lungs continuous.    . pantoprazole (PROTONIX) 40 MG tablet Take 40 mg by mouth daily.    . polyethylene glycol  (MIRALAX / GLYCOLAX) packet Take 17 g by mouth daily as needed (constipation). Mix in 8 oz liquid and drink    . potassium chloride SA (K-DUR,KLOR-CON) 20 MEQ tablet Take 2 tablets (40 meq) by mouth every morning and 1 tablet (20 meq) at night. Take 1 tablet whenever you take extra tablet of Lasix.    . pregabalin (LYRICA) 100 MG capsule Take 100 mg by mouth daily. Take QAM    . pregabalin (LYRICA) 300 MG capsule Take 300 mg by mouth 2 (two) times daily.    . traZODone (DESYREL) 50 MG tablet Take 50 mg by mouth at bedtime. INSOMNIA. Patient taking 3  tablets at bedtime .     Facility-Administered Medications Ordered in Other Visits  Medication Dose Route Frequency Provider Last Rate Last Dose  . ferumoxytol (FERAHEME) 510 mg in sodium chloride 0.9 % 100 mL IVPB  510 mg Intravenous Once Volanda Napoleon, MD        Allergies:   Penicillins    Social History:  The patient  reports that she has never smoked. She has never used smokeless tobacco. She reports that she does not drink alcohol or use drugs.   Family History:  The patient's family history includes Arthritis in her mother; Diabetes in her father; Heart attack in her father and paternal grandmother; Heart disease in her father; Hypertension in her father; Kidney disease in her mother; Stroke in her father.    ROS:  Please see the history of present illness. All other systems are reviewed and negative.    PHYSICAL EXAM: VS:  BP 120/70   Pulse 90   Ht 5\' 4"  (1.626 m)   Wt 177 lb (80.3 kg)   SpO2 92% Comment: 3LITERS  BMI 30.38 kg/m  , BMI Body mass index is 30.38 kg/m. GEN: Well nourished, well developed, female in no acute distress  HEENT: normal for age  Neck: JVD 9 cm, +HJR, no carotid bruit, no masses Cardiac: RRR; soft murmur, no rubs, or gallops Respiratory: Decreased breath sounds bases with rails bilaterally, slightly increased work of breathing GI: soft, nontender, nondistended, + BS MS: no deformity or atrophy; 2+  edema; distal pulses are 2+ in all 4 extremities, but hard to palpate her lower extremities due to edema  Skin: warm and dry, no rash Neuro:  Strength and sensation are intact Psych: euthymic mood, full affect   EKG:  EKG is ordered today. The ekg ordered today demonstrates sinus rhythm, heart rate 90, right bundle branch block which is old and inferior Q waves, also old  ECHO: 06/02/2016 - Left ventricle: The cavity size was normal. Systolic function was   normal. The estimated ejection fraction was in the range of 60%   to 65%. Wall motion was normal; there were no regional wall   motion abnormalities. Doppler parameters are consistent with   abnormal left ventricular relaxation (grade 1 diastolic   dysfunction). - Mitral valve: Calcified annulus. Impressions: - Compared to the prior study, there has been no significant   interval change.  MYOVIEW: 2016 IMPRESSION: 1. No reversible ischemia or infarction.  2. Normal left ventricular wall motion.  3. Left ventricular ejection fraction 96%. Ejection fractions are typical overestimated with this technique and small cardiac volumes.  4. Low-risk stress test findings*.  Recent Labs: 04/08/2016: Magnesium 2.0; TSH 1.002 05/30/2016: ALT 15; B Natriuretic Peptide 87.9 06/02/2016: Hemoglobin 12.4; Platelets 194 08/28/2016: BUN 15; Creat 0.88; Potassium 3.6; Sodium 140    Lipid Panel    Component Value Date/Time   CHOL 152 01/31/2013 0500   TRIG 92 01/31/2013 0500   HDL 44 01/31/2013 0500   CHOLHDL 3.5 01/31/2013 0500   VLDL 18 01/31/2013 0500   LDLCALC 90 01/31/2013 0500     Wt Readings from Last 3 Encounters:  09/19/16 177 lb (80.3 kg)  08/28/16 172 lb (78 kg)  07/23/16 172 lb (78 kg)     Other studies Reviewed: Additional studies/ records that were reviewed today include: Previous notes, other records and testing.  ASSESSMENT AND PLAN: Dr. Claiborne Billings was in to see the patient  1. Acute on chronic diastolic CHF: Fire  home scales, she is up 9 pounds. Also at home, she states her oxygen saturation was dropping into the 80s with ambulation. She requires 3 L of oxygen a baseline to maintain her sats. She is frail and increasing her home Lasix has not been successful in pulling the fluid off.  Dr. Claiborne Billings was in to see the patient and agrees that admission is needed with IV diuresis to improve her volume status. We will check her daily weights, follow strict intake/output, and monitor her renal function daily.  2. Chest discomfort: She has a history of moderate coronary artery disease by heart catheterization in 2014. We will cycle cardiac enzymes. Area has previously been normal and heart catheterization in 2014 was nonobstructive. Myoview in 2016 was without scar or ischemia. The chest discomfort may be related to her respiratory problems.  If her cardiac enzymes remain negative, consider outpatient Myoview for evaluation, once her respiratory status is at baseline. At this time, I do not think cardiac catheterization is indicated.   Current medicines are reviewed at length with the patient today.  The patient does not have concerns regarding medicines.  The following changes have been made:  Changes to be made as an inpatient  Disposition:  FU with Dr. Stanford Breed  Signed, Barrett, [address]  09/19/2016 2:29 PM    [place] Phone: [phone]; Fax: [phone]  This note was written with the assistance of speech recognition software. Please excuse any transcriptional errors.   Patient seen and examined. Agree with assessment and plan.  Ms. Myani Busam is a 70 year old Caucasian female is a history of mild nonobstructive CAD, COPD, asthma, and diastolic heart failure.  An echo Doppler study in June 2017 showed an ejection fraction of 60-65% with grade 1 diastolic dysfunction.  Rest nuclear perfusion study in 2016 was negative for ischemia.  She has right bundle branch  block.  Her she had recently B6 been seen in the office over the last month with stable weight.  However, recently she had gone on vacation, but upon returning home has gained approximate 9-10 pounds.  She has noticed progressive lower extremity edema, PND and orthopnea, and has developed progressive oxygen desaturation.  The last several days.  She has taken additional furosemide without resolution of her symptoms.  She also has noticed chest pressure associated with the sensation of increasing shortness of breath and presented to the office today for evaluation.  She is felt to have volume overload.  Her pulse is in the 90s.  Blood pressure is 120/70.  JVD measured 9 cm.  There was hepatojugular reflux.  She had decreased breath sounds bilaterally.  Her rhythm was regular with a 1/6 systolic murmur.  Her abdomen was nontender with positive bowel sounds.  She had 2+ peripheral edema.  I agree with the workup of Rosaria Ferries and feel the patient is best treated with hospital admission for intravenous diuresis and medication adjustment.   Troy Sine, MD, [facility] 09/19/2016 7:36 PM

## 2016-09-19 NOTE — Progress Notes (Addendum)
Cardiology Office Note   Date:  09/19/2016   ID:  Linda [name], DOB [birth year], MRN [id number]  PCP:  Tammi Sou, MD  Cardiologist:  Dr Dineen Kid, Suanne Marker, PA-C   History of Present Illness: Linda [name] is a 70 y.o. female with a history of  COPD on O2 3 lpm, HTN, ?orthostatic syncope, D-CHF, mod CAD by cath 2014 w/ med rx, fibromyalgia w/ chronic pain issues. RBBB and inf Q waves on ECG.  Seen in office early August and early September, wt stable, she was taking prn Lasix.   [name] presents for evaluation and treatment of her CHF.  She went on vacation with her family and did not gain any weight. She stated her weight was 169 pounds when she got back. However, since then, her weight is begun to trend up. She denies dietary indiscretions, but states that her weight has been steadily trending up. She is now almost 10 pounds above her dry weight. In association with the increase in her weight, she has noticed increasing lower extremity edema and orthopnea. She has been on oxygen at 3 L which is normal for her, but has noticed her oxygen saturations at home dropping lower than usual, into the 80s at times.  She has taken the extra Lasix as directed for 3 days, without any improvement in her weight or in her symptoms.  Today, she is also complaining of chest pressure. She cannot say exactly when it started. She is very concerned about the chest pressure. It is greater than a 5/10. He gets a little worse with deep inspiration.  She has not had palpitations or presyncope.   Past Medical History:  Diagnosis Date  . Acute upper GI bleed 06/2014   while pt taking coumadin, plavix, and meloxicam---despite being told not to take coumadin.  . Anginal pain ([place])    Nonobstructive CAD 2014; however, her cardiologist put her on a statin for this and NOT for hyperlipidemia per pt report.  . Anxiety    panic attacks  . Asthma    w/ asbestososis   . BPPV  (benign paroxysmal positional vertigo) 12/16/2012  . Chronic lower back pain   . COPD (chronic obstructive pulmonary disease) ([place])   . DDD (degenerative disc disease)    lumbar and cervical.   . Depression   . Diastolic congestive heart failure ([place])    zaroxolyn 2.5mg  q week prn dose added 06/27/16 by cardiology  . Diverticular disease   . Fibromyalgia    Patient states dx was around her late 42s but she had sx's for years prior to this.  . H/O hiatal hernia   . Hay fever   . History of pneumonia    hospitalized 12/2011, 02/2013, and 07/2013 [facility]) for this  . HTN (hypertension)   . Idiopathic angio-edema-urticaria 72014   Angioedema component was very minimal  . Insomnia   . Iron deficiency anemia    Hematologist in [place], [place] did extensive w/u; no cause found; failed oral supplement;; gets fairly regular (q3m or so) IV iron infusions (Venofer -iron sucrose- 200mg  with procrit.  "for 14 yr I've been getting blood work q month & getting infusions prn" (07/12/2013).  Dr. Marin Olp locally, iron infusions done, EPO deficiency dx'd  . Migraine syndrome    "not as often anymore; used to be ~ q wk" (07/12/2013)  . Mixed incontinence urge and stress   . Nephrolithiasis    "passed all on my own or  they are still in there" (07/12/2013)  . OSA on CPAP    prior to move to [place]--had another sleep study 10/2015 w/pulm Dr. Camillo Flaming.  . Osteoarthritis    "severe; progressing fast" (07/12/2013); multiple joints-not surgical candidate for TKR (03/2015)  . Pernicious anemia 08/24/2014  . Pleural plaque with presence of asbestos 07/22/2013  . Pulmonary embolism ([place]) 07/2013   Dx at [facility] with very small peripheral upper lobe pe 07/2013: pt took coumadin for about 8-9 mo  . Pyelonephritis    "several times over the last 30 yr" (07/12/2013)  . RBBB (right bundle branch block)   . Recurrent UTI    hx of hospitalization for pyelonephritis; started abx prophylaxis 06/2015  . Syncope    Hypotensive; ED visit--Dr.  Terrence Dupont did Cath--nonobstructive CAD, EF 55-60%.  In retrospect, suspect pt rx med misuse/polypharmacy  . Tension headache, chronic     Past Surgical History:  Procedure Laterality Date  . APPENDECTOMY  1960  . AXILLARY SURGERY Left 1978   Multiple "lump" in armpit per pt  . CARDIAC CATHETERIZATION  01/2013   nonobstructive CAD, EF 55-60%  . CARDIOVASCULAR STRESS TEST  02/22/15   Low risk myocard perf imaging; wall motion normal, normal EF  . COCCYX REMOVAL  1972  . DILATION AND CURETTAGE OF UTERUS  ? 1970's  . ESOPHAGOGASTRODUODENOSCOPY N/A 07/19/2014   Gastritis found + in the setting of supratherapeutic INR, +plavix, + meloxicam.  . EYE SURGERY Left 2012-2013   "injections for ~ 1 yr; don't really know what for" (07/12/2013)  . HEEL SPUR SURGERY Left 2008  . KNEE SURGERY  2005  . LEFT HEART CATHETERIZATION WITH CORONARY ANGIOGRAM N/A 01/30/2013   Procedure: LEFT HEART CATHETERIZATION WITH CORONARY ANGIOGRAM;  Surgeon: Clent Demark, MD;  Location: [facility] CATH LAB;  Service: Cardiovascular;  Laterality: N/A;  . PLANTAR FASCIA RELEASE Left 2008  . SPIROMETRY  04/25/14   In hosp for acute asthma/COPD flare: mixed obstructive and restrictive lung disease. The FEV1 is severely reduced at 45% predicted.  FEV1 signif decreased compared to prior spirometry 07/23/13.  . TENDON RELEASE  1996   Right forearm and hand  . TOTAL ABDOMINAL HYSTERECTOMY  1974  . TRANSTHORACIC ECHOCARDIOGRAM  01/2013; 04/2014;08/2015   2014--NORMAL.  2015--focal basal septal hypertrophy, EF 55-60%, grade I diast dysfxn, mild LAE.  08/2015 EF 55-60%, nl LV syst fxn, grade I DD, valves wnl    Current Outpatient Prescriptions  Medication Sig Dispense Refill  . acetaminophen (TYLENOL) 325 MG tablet Take 2 tablets (650 mg total) by mouth every 4 (four) hours as needed for headache or mild pain.    [name] Kitchen albuterol (PROVENTIL HFA;VENTOLIN HFA) 108 (90 BASE) MCG/ACT inhaler Inhale 2 puffs into the lungs every 6 (six) hours as needed for  wheezing or shortness of breath. 1 Inhaler 1  . ALPRAZolam (XANAX) 1 MG tablet Take 1 mg by mouth 3 (three) times daily as needed for anxiety.    . ARIPiprazole (ABILIFY) 10 MG tablet Take 10 mg by mouth daily.    [name] Kitchen aspirin 81 MG tablet Take 81 mg by mouth at bedtime.    [name] Kitchen atorvastatin (LIPITOR) 20 MG tablet Take 20 mg by mouth daily.    . budesonide-formoterol (SYMBICORT) 160-4.5 MCG/ACT inhaler Inhale 2 puffs into the lungs 2 (two) times daily. 1 Inhaler 12  . Cholecalciferol (VITAMIN D3) 50000 units CAPS Take 1 capsule by mouth every Monday.     . diclofenac sodium (VOLTAREN) 1 % GEL Apply 1 application  topically 3 (three) times daily as needed (pain).     . DULoxetine (CYMBALTA) 30 MG capsule Take 30 mg by mouth daily.    . DULoxetine (CYMBALTA) 60 MG capsule Take 60 mg by mouth daily. TO BE COMBINED WITH 30 MG TAB TO EQUAL 90 MG    . folic acid (FOLVITE) 1 MG tablet Take 2 tablets (2 mg total) by mouth daily. 60 tablet 6  . furosemide (LASIX) 40 MG tablet Take 40 mg by mouth daily. Take 1 extra tablets for swelling    . ibuprofen (ADVIL,MOTRIN) 800 MG tablet Take 800 mg by mouth daily as needed (pain).    . isosorbide mononitrate (IMDUR) 30 MG 24 hr tablet Take 30 mg by mouth daily.    . metoprolol succinate (TOPROL XL) 25 MG 24 hr tablet Take 1 tablet (25 mg total) by mouth daily. 30 tablet 11  . Multiple Vitamin (MULTIVITAMIN WITH MINERALS) TABS tablet Take 1 tablet by mouth daily.    . nitroGLYCERIN (NITROSTAT) 0.4 MG SL tablet Place 0.4 mg under the tongue every 5 (five) minutes as needed for chest pain (x 3 doses).     . ondansetron (ZOFRAN-ODT) 4 MG disintegrating tablet Take 4 mg by mouth every 8 (eight) hours as needed for nausea or vomiting.    . Oxycodone HCl 10 MG TABS Take 1 tablet (10 mg total) by mouth every 6 (six) hours. 100 tablet 0  . OXYGEN Inhale 3 L into the lungs continuous.    . pantoprazole (PROTONIX) 40 MG tablet Take 40 mg by mouth daily.    . polyethylene glycol  (MIRALAX / GLYCOLAX) packet Take 17 g by mouth daily as needed (constipation). Mix in 8 oz liquid and drink    . potassium chloride SA (K-DUR,KLOR-CON) 20 MEQ tablet Take 2 tablets (40 meq) by mouth every morning and 1 tablet (20 meq) at night. Take 1 tablet whenever you take extra tablet of Lasix.    . pregabalin (LYRICA) 100 MG capsule Take 100 mg by mouth daily. Take QAM    . pregabalin (LYRICA) 300 MG capsule Take 300 mg by mouth 2 (two) times daily.    . traZODone (DESYREL) 50 MG tablet Take 50 mg by mouth at bedtime. INSOMNIA. Patient taking 3  tablets at bedtime .     Facility-Administered Medications Ordered in Other Visits  Medication Dose Route Frequency Provider Last Rate Last Dose  . ferumoxytol (FERAHEME) 510 mg in sodium chloride 0.9 % 100 mL IVPB  510 mg Intravenous Once Volanda Napoleon, MD        Allergies:   Penicillins    Social History:  The patient  reports that she has never smoked. She has never used smokeless tobacco. She reports that she does not drink alcohol or use drugs.   Family History:  The patient's family history includes Arthritis in her mother; Diabetes in her father; Heart attack in her father and paternal grandmother; Heart disease in her father; Hypertension in her father; Kidney disease in her mother; Stroke in her father.    ROS:  Please see the history of present illness. All other systems are reviewed and negative.    PHYSICAL EXAM: VS:  BP 120/70   Pulse 90   Ht 5\' 4"  (1.626 m)   Wt 177 lb (80.3 kg)   SpO2 92% Comment: 3LITERS  BMI 30.38 kg/m  , BMI Body mass index is 30.38 kg/m. GEN: Well nourished, well developed, female in no acute distress  HEENT: normal for age  Neck: JVD 9 cm, +HJR, no carotid bruit, no masses Cardiac: RRR; soft murmur, no rubs, or gallops Respiratory: Decreased breath sounds bases with rails bilaterally, slightly increased work of breathing GI: soft, nontender, nondistended, + BS MS: no deformity or atrophy; 2+  edema; distal pulses are 2+ in all 4 extremities, but hard to palpate her lower extremities due to edema  Skin: warm and dry, no rash Neuro:  Strength and sensation are intact Psych: euthymic mood, full affect   EKG:  EKG is ordered today. The ekg ordered today demonstrates sinus rhythm, heart rate 90, right bundle branch block which is old and inferior Q waves, also old  ECHO: 06/02/2016 - Left ventricle: The cavity size was normal. Systolic function was   normal. The estimated ejection fraction was in the range of 60%   to 65%. Wall motion was normal; there were no regional wall   motion abnormalities. Doppler parameters are consistent with   abnormal left ventricular relaxation (grade 1 diastolic   dysfunction). - Mitral valve: Calcified annulus. Impressions: - Compared to the prior study, there has been no significant   interval change.  MYOVIEW: 2016 IMPRESSION: 1. No reversible ischemia or infarction.  2. Normal left ventricular wall motion.  3. Left ventricular ejection fraction 96%. Ejection fractions are typical overestimated with this technique and small cardiac volumes.  4. Low-risk stress test findings*.  Recent Labs: 04/08/2016: Magnesium 2.0; TSH 1.002 05/30/2016: ALT 15; B Natriuretic Peptide 87.9 06/02/2016: Hemoglobin 12.4; Platelets 194 08/28/2016: BUN 15; Creat 0.88; Potassium 3.6; Sodium 140    Lipid Panel    Component Value Date/Time   CHOL 152 01/31/2013 0500   TRIG 92 01/31/2013 0500   HDL 44 01/31/2013 0500   CHOLHDL 3.5 01/31/2013 0500   VLDL 18 01/31/2013 0500   LDLCALC 90 01/31/2013 0500     Wt Readings from Last 3 Encounters:  09/19/16 177 lb (80.3 kg)  08/28/16 172 lb (78 kg)  07/23/16 172 lb (78 kg)     Other studies Reviewed: Additional studies/ records that were reviewed today include: Previous notes, other records and testing.  ASSESSMENT AND PLAN: Dr. Claiborne Billings was in to see the patient  1. Acute on chronic diastolic CHF: Fire  home scales, she is up 9 pounds. Also at home, she states her oxygen saturation was dropping into the 80s with ambulation. She requires 3 L of oxygen a baseline to maintain her sats. She is frail and increasing her home Lasix has not been successful in pulling the fluid off.  Dr. Claiborne Billings was in to see the patient and agrees that admission is needed with IV diuresis to improve her volume status. We will check her daily weights, follow strict intake/output, and monitor her renal function daily.  2. Chest discomfort: She has a history of moderate coronary artery disease by heart catheterization in 2014. We will cycle cardiac enzymes. Area has previously been normal and heart catheterization in 2014 was nonobstructive. Myoview in 2016 was without scar or ischemia. The chest discomfort may be related to her respiratory problems.  If her cardiac enzymes remain negative, consider outpatient Myoview for evaluation, once her respiratory status is at baseline. At this time, I do not think cardiac catheterization is indicated.   Current medicines are reviewed at length with the patient today.  The patient does not have concerns regarding medicines.  The following changes have been made:  Changes to be made as an inpatient  Labs/ tests ordered  today include:  No orders of the defined types were placed in this encounter.    Disposition:   FU with Dr. Stanford Breed  Signed, Rosaria Ferries, PA-C  09/19/2016 2:29 PM    [place] Phone: [phone]; Fax: [phone]  This note was written with the assistance of speech recognition software. Please excuse any transcriptional errors.   Patient seen and examined. Agree with assessment and plan.  Ms. Merrell Luttrell is a 70 year old Caucasian female is a history of mild nonobstructive CAD, COPD, asthma, and diastolic heart failure.  An echo Doppler study in June 2017 showed an ejection fraction of 60-65% with grade 1 diastolic  dysfunction.  Rest nuclear perfusion study in 2016 was negative for ischemia.  She has right bundle branch block.  Her she had recently B6 been seen in the office over the last month with stable weight.  However, recently she had gone on vacation, but upon returning home has gained approximate 9-10 pounds.  She has noticed progressive lower extremity edema, PND and orthopnea, and has developed progressive oxygen desaturation.  The last several days.  She has taken additional furosemide without resolution of her symptoms.  She also has noticed chest pressure associated with the sensation of increasing shortness of breath and presented to the office today for evaluation.  She is felt to have volume overload.  Her pulse is in the 90s.  Blood pressure is 120/70.  JVD measured 9 cm.  There was hepatojugular reflux.  She had decreased breath sounds bilaterally.  Her rhythm was regular with a 1/6 systolic murmur.  Her abdomen was nontender with positive bowel sounds.  She had 2+ peripheral edema.  I agree with the workup of Rosaria Ferries and feel the patient is best treated with hospital admission for intravenous diuresis and medication adjustment.  Troy Sine, MD 09/19/2016  7:47 PM

## 2016-09-19 NOTE — Progress Notes (Signed)
Direct Admission via heartcare office. Pt brought to the floor in stable condition. Vitals taken. Initial Assessment done. All immediate pertinent needs to patient addressed. Patient Guide given to patient. Important safety instructions relating to hospitalization reviewed with patient. Patient verbalized understanding. Will continue to monitor pt.  Maurene Capes RN

## 2016-09-20 DIAGNOSIS — E785 Hyperlipidemia, unspecified: Secondary | ICD-10-CM

## 2016-09-20 LAB — BASIC METABOLIC PANEL
Anion gap: 9 (ref 5–15)
BUN: 9 mg/dL (ref 6–20)
CO2: 30 mmol/L (ref 22–32)
Calcium: 9 mg/dL (ref 8.9–10.3)
Chloride: 102 mmol/L (ref 101–111)
Creatinine, Ser: 0.79 mg/dL (ref 0.44–1.00)
GFR calc Af Amer: >60 mL/min (ref >60)
GFR calc non Af Amer: >60 mL/min (ref >60)
Glucose, Bld: 86 mg/dL (ref 65–99)
Potassium: 3 mmol/L — ABNORMAL LOW (ref 3.5–5.1)
Sodium: 141 mmol/L (ref 135–145)

## 2016-09-20 LAB — TROPONIN I: Troponin I: <0.03 ng/mL (ref <0.03)

## 2016-09-20 MED ORDER — FUROSEMIDE 10 MG/ML IJ SOLN
INTRAMUSCULAR | Status: AC
  Administered 2016-09-20: 09:00:00
  Filled 2016-09-20: qty 4

## 2016-09-20 MED ORDER — ALPRAZOLAM 0.5 MG PO TABS
1.0000 mg | ORAL_TABLET | Freq: Two times a day (BID) | ORAL | Status: DC
  Administered 2016-09-20 – 2016-09-23 (×6): 1 mg via ORAL
  Filled 2016-09-20 (×6): qty 2

## 2016-09-20 MED ORDER — ALPRAZOLAM 0.5 MG PO TABS: 1.0000 mg | ORAL_TABLET | Freq: Every day | ORAL | Status: DC | PRN

## 2016-09-20 MED ORDER — TRAZODONE HCL 150 MG PO TABS
150.0000 mg | ORAL_TABLET | Freq: Every day | ORAL | Status: DC
  Administered 2016-09-20 – 2016-09-22 (×3): 150 mg via ORAL
  Filled 2016-09-20 (×3): qty 1

## 2016-09-20 MED ORDER — FUROSEMIDE 10 MG/ML IJ SOLN
INTRAMUSCULAR | Status: AC
  Administered 2016-09-20: 18:00:00
  Filled 2016-09-20: qty 4

## 2016-09-20 NOTE — Evaluation (Signed)
Physical Therapy Evaluation Patient Details Name: Linda [name] MRN: [id number] DOB: July 27, 1946 Today's Date: 09/20/2016   History of Present Illness  70 y.o. female with a history of COPD on O2 3 lpm, HTN, ?orthostatic syncope, D-CHF, mod CAD by cath 2014 w/ med rx, fibromyalgia w/ chronic pain issues. Admitted with increasing lower extremity edema and orthopnea. +10 lb incr over baseline weight with +acute CHF    Clinical Impression  Pt admitted with above diagnosis. Patient moves slowly and carefully. Lives alone with good support and reports no falls. Pt currently with functional limitations due to the deficits listed below (see PT Problem List).  Pt will benefit from skilled PT to increase their independence and safety with mobility to allow discharge to the venue listed below.       Follow Up Recommendations No PT follow up (patient has had HHPT and still does her ex's);Supervision - Intermittent    Equipment Recommendations  None recommended by PT    Recommendations for Other Services OT consult     Precautions / Restrictions Precautions Precautions: Fall      Mobility  Bed Mobility Overal bed mobility: Modified Independent             General bed mobility comments: moves very slowly  Transfers Overall transfer level: Needs assistance Equipment used: None Transfers: Sit to/from Stand;Stand Pivot Transfers Sit to Stand: Min guard Stand pivot transfers: Min guard       General transfer comment: assist due to dizziness/safety; no unsafe behaviors noted  Ambulation/Gait             General Gait Details: deferred  Stairs            Wheelchair Mobility    Modified Rankin (Stroke Patients Only)       Balance Overall balance assessment: Needs assistance Sitting-balance support: No upper extremity supported;Feet unsupported Sitting balance-Leahy Scale: Good     Standing balance support: No upper extremity supported Standing  balance-Leahy Scale: Good Standing balance comment: able to reach/bend to perform pericare after using BSC                             Pertinent Vitals/Pain See vitals flow sheet.   Pain Assessment: No/denies pain    Home Living Family/patient expects to be discharged to:: Private residence Living Arrangements: Alone Available Help at Discharge: Family;Friend(s);Available PRN/intermittently Type of Home: Independent living facility Home Access: Level entry     Home Layout: One level Home Equipment: Cane - single point;Shower seat;Grab bars - toilet;Grab bars - tub/shower;Hand held shower head;Walker - 4 wheels;Other (comment);Wheelchair - Brewing technologist - built in      Prior Function Level of Independence: Needs assistance   Gait / Transfers Assistance Needed: using rollator   ADL's / Homemaking Assistance Needed: modified independent (bathes @sink  or in shower); wears depends for incontinence; reported takes her 10 minutes to reach/get her feet into her underwear or pants        Hand Dominance   Dominant Hand: Right    Extremity/Trunk Assessment   Upper Extremity Assessment: Generalized weakness           Lower Extremity Assessment: Generalized weakness (+edema)      Cervical / Trunk Assessment: Kyphotic  Communication   Communication: No difficulties (slow speech)  Cognition Arousal/Alertness: Awake/alert Behavior During Therapy: Flat affect Overall Cognitive Status: Within Functional Limits for tasks assessed  General Comments      Exercises General Exercises - Upper Extremity Elbow Flexion: AROM;Both;20 reps (for increasing BP) Digit Composite Flexion: AROM;Both;20 reps (for increasing BP) Low Level/ICU Exercises Ankle Circles/Pumps: AROM;Both;10 reps Stabilized Bridging: AROM;Both;Right;Left;5 reps (single leg bridging less painful for knees)   Assessment/Plan    PT Assessment Patient needs continued  PT services  PT Problem List Decreased activity tolerance;Decreased balance;Decreased mobility;Decreased knowledge of use of DME;Cardiopulmonary status limiting activity;Obesity          PT Treatment Interventions DME instruction;Gait training;Functional mobility training;Therapeutic activities;Therapeutic exercise;Balance training;Patient/family education    PT Goals (Current goals can be found in the Care Plan section)  Acute Rehab PT Goals Patient Stated Goal: return home PT Goal Formulation: With patient Time For Goal Achievement: 09/27/16 Potential to Achieve Goals: Good    Frequency Min 3X/week   Barriers to discharge Decreased caregiver support intermittent assist only    Co-evaluation               End of Session Equipment Utilized During Treatment: Gait belt;Oxygen Activity Tolerance: Patient limited by fatigue;Treatment limited secondary to medical complications (Comment) (dizziness) Patient left: in bed;with call bell/phone within reach;with bed alarm set Nurse Communication: Mobility status;Other (comment) (dizziness; BP decr in standing)         Time: 1112-1204 PT Time Calculation (min) (ACUTE ONLY): 52 min   Charges:   PT Evaluation $PT Eval Moderate Complexity: 1 Procedure PT Treatments $Therapeutic Activity: 23-37 mins   PT G Codes:        Blane Worthington September 28, 2016, 2:20 PM

## 2016-09-20 NOTE — Progress Notes (Signed)
PROGRESS NOTE  Subjective:   70 y.o. female with a history of COPD on O2 3 lpm, HTN, ?orthostatic syncope, D-CHF, mod CAD by cath 2014 w/ med rx, fibromyalgia w/ chronic pain issues. RBBB and inf Q waves on ECG.  She was admitted from the office with increased shortness of breath and volume overload. She has diuresed some.  Is feeling slightly better   Objective:    Vital Signs:   Temp:  [97.5 F (36.4 C)-98.1 F (36.7 C)] 97.8 F (36.6 C) (09/29 0930) Pulse Rate:  [66-90] 67 (09/29 0930) Resp:  [12-18] 12 (09/29 0930) BP: (90-131)/(48-73) 90/48 (09/29 0930) SpO2:  [92 %-100 %] 98 % (09/29 0948) Weight:  [174 lb 11.2 oz (79.2 kg)-177 lb (80.3 kg)] 174 lb 11.2 oz (79.2 kg) (09/29 0641)  Last BM Date: 09/18/16   24-hour weight change: Weight change:   Weight trends: Filed Weights   09/19/16 1749 09/20/16 0641  Weight: 176 lb 8 oz (80.1 kg) 174 lb 11.2 oz (79.2 kg)    Intake/Output:  09/28 0701 - 09/29 0700 In: 320 [P.O.:320] Out: 1500 [Urine:1500] Total I/O In: -  Out: 100 [Urine:100]   Physical Exam: BP (!) 90/48 (BP Location: Left Arm)   Pulse 67   Temp 97.8 F (36.6 C) (Oral)   Resp 12   Ht 5\' 4"  (1.626 m)   Wt 174 lb 11.2 oz (79.2 kg) Comment: scale b  SpO2 98%   BMI 29.99 kg/m   Wt Readings from Last 3 Encounters:  09/20/16 174 lb 11.2 oz (79.2 kg)  09/19/16 177 lb (80.3 kg)  08/28/16 172 lb (78 kg)    General: Vital signs reviewed and noted.   Head: Normocephalic, atraumatic.  Eyes: conjunctivae/corneas clear.  EOM's intact.   Throat: normal  Neck:  normal   Lungs:    few rales in bases   Heart:  RR  Abdomen:  Soft, non-tender, non-distended    Extremities: 1+ pitting edema    Neurologic: A&O X3, CN II - XII are grossly intact.   Psych: Normal     Labs: BMET:  Recent Labs  09/19/16 1800 09/20/16 0542  NA 139 141  K 3.2* 3.0*  CL 100* 102  CO2 28 30  GLUCOSE 87 86  BUN 9 9  CREATININE 0.85 0.79  CALCIUM 9.4 9.0     Liver function tests:  Recent Labs  09/19/16 1800  AST 21  ALT 15  ALKPHOS 72  BILITOT 1.0  PROT 6.9  ALBUMIN 3.9   No results for input(s): LIPASE, AMYLASE in the last 72 hours.  CBC:  Recent Labs  09/19/16 1800  WBC 8.0  HGB 12.8  HCT 39.8  MCV 90.2  PLT 220    Cardiac Enzymes:  Recent Labs  09/19/16 1800 09/19/16 2301 09/20/16 0542  TROPONINI <0.03 <0.03 <0.03    Coagulation Studies: No results for input(s): LABPROT, INR in the last 72 hours.  Other: Invalid input(s): POCBNP No results for input(s): DDIMER in the last 72 hours. No results for input(s): HGBA1C in the last 72 hours. No results for input(s): CHOL, HDL, LDLCALC, TRIG, CHOLHDL in the last 72 hours. No results for input(s): TSH, T4TOTAL, T3FREE, THYROIDAB in the last 72 hours.  Invalid input(s): FREET3 No results for input(s): VITAMINB12, FOLATE, FERRITIN, TIBC, IRON, RETICCTPCT in the last 72 hours.   Other results:  EKG  ( personally reviewed )  -  NSR , RBBB   Medications:  Infusions:    Scheduled Medications: . ARIPiprazole  10 mg Oral Daily  . aspirin EC  81 mg Oral QHS  . atorvastatin  20 mg Oral Daily  . DULoxetine  90 mg Oral Daily  . enoxaparin (LOVENOX) injection  40 mg Subcutaneous Q24H  . folic acid  2 mg Oral Daily  . furosemide  40 mg Intravenous BID  . isosorbide mononitrate  30 mg Oral Daily  . metoprolol succinate  25 mg Oral Daily  . mometasone-formoterol  2 puff Inhalation BID  . multivitamin with minerals  1 tablet Oral Daily  . oxyCODONE  10 mg Oral Q6H  . pantoprazole  40 mg Oral Daily  . potassium chloride SA  40 mEq Oral BID  . pregabalin  300 mg Oral QHS  . sodium chloride flush  3 mL Intravenous Q12H  . traZODone  100 mg Oral QHS    Assessment/ Plan:   Principal Problem:   Acute on chronic diastolic CHF (congestive heart failure), NYHA class 3 (HCC) Active Problems:   Chronic respiratory failure (HCC)   Chronic pain syndrome   Chest  pain with moderate risk for cardiac etiology   Chest pain, moderate coronary artery risk  1. Acute on chronic diastolic CHF:   She is negative 1.2 liters so far. Still has led edema Will continue the current IV lasix dose for several more days.  Continue imdur, metoprolol  2. Hyperlipidemia :  Continue atorvastatin   Disposition:  Length of Stay: 1  Ramond Dial., MD, [facility] 09/20/2016, 10:31 AM Office [phone] Pager [phone]

## 2016-09-20 NOTE — Consult Note (Signed)
   [facility] CM Inpatient Consult   09/20/2016  [name] October 20, 1946 [id number]   Referral received to assess for care management services. This is the patient's 3rd admission in 6 months. Chart review reveals per MD notes, 'Linda [name] is a 70 y.o. female with a history of COPD on O2 3 lpm, HTN, ?orthostatic syncope, diastolic HF, mod CAD by cath 2014 w/ med rx, fibromyalgia w/ chronic pain issues. RBBB and inf Q waves on ECG.She went on vacation with her family and did not gain any weight. She stated her weight was 169 pounds when she got back. However, since then, her weight is begun to trend up. She denies dietary indiscretions, but states that her weight has been steadily trending up. She is now almost 10 pounds above her dry weight on admission. In association with the increase in her weight, she has noticed increasing lower extremity edema and orthopnea. She has been on oxygen at 3 L which is normal for her, but has noticed her oxygen saturations at home dropping lower than usual, into the 80s at times.'  Met with the patient regarding the benefits of [facility] services as a benefit of her Medicare.  Patient endorses [name] as her primary care provider .  Explained that [facility] is a covered benefit of insurance. Review information for [facility] Care Management and a brochure was provided with contact information.  Explained that [facility] does not interfere with or replace any services arranged by the inpatient care management staff.  Patient pleasantly declined services with [facility] states, " I don't think I will need that again right now."  Explained that this is no cost and encouraged the 24 hour nurse line if needs arise.  For questions, please contact:  Natividad Brood, RN BSN [facility] Liaison  [phone] business mobile phone Toll free office [phone]

## 2016-09-20 NOTE — Progress Notes (Signed)
Physical Therapy Brief Note     Full eval to follow    09/20/16 1210  PT Assessment  PT Recommendation/Assessment Patient needs continued PT services while hospitalized  PT Recommendation  Recommendations for Other Services OT consult (for long-handled AE to assist with ADLs)  Follow Up Recommendations No PT follow up  PT equipment None recommended by PT     09/20/2016 Barry Brunner, PT Pager: [phone]

## 2016-09-21 LAB — BASIC METABOLIC PANEL
ANION GAP: 8 (ref 5–15)
BUN: 10 mg/dL (ref 6–20)
CALCIUM: 9.1 mg/dL (ref 8.9–10.3)
CO2: 33 mmol/L — AB (ref 22–32)
Chloride: 101 mmol/L (ref 101–111)
Creatinine, Ser: 0.81 mg/dL (ref 0.44–1.00)
GFR calc Af Amer: >60 mL/min (ref >60)
GFR calc non Af Amer: >60 mL/min (ref >60)
GLUCOSE: 106 mg/dL — AB (ref 65–99)
Potassium: 3.7 mmol/L (ref 3.5–5.1)
Sodium: 142 mmol/L (ref 135–145)

## 2016-09-21 NOTE — Progress Notes (Signed)
PROGRESS NOTE  Subjective:   70 y.o. female with a history of COPD on O2 3 lpm, HTN, ?orthostatic syncope, D-CHF, mod CAD by cath 2014 w/ med rx, fibromyalgia w/ chronic pain issues. RBBB and inf Q waves on ECG.  She was admitted from the office with increased shortness of breath and volume overload. She has diuresed 2 liters .  Is feeling slightly better   Objective:    Vital Signs:   Temp:  [97.7 F (36.5 C)-98.8 F (37.1 C)] 97.7 F (36.5 C) (09/30 1125) Pulse Rate:  [79-86] 86 (09/30 1125) Resp:  [18-20] 18 (09/30 1125) BP: (92-119)/(47-60) 99/54 (09/30 1125) SpO2:  [91 %-98 %] 95 % (09/30 1125)  Last BM Date: 09/18/16   24-hour weight change: Weight change:   Weight trends: Filed Weights   09/19/16 1749 09/20/16 0641  Weight: 176 lb 8 oz (80.1 kg) 174 lb 11.2 oz (79.2 kg)    Intake/Output:  09/29 0701 - 09/30 0700 In: 720 [P.O.:720] Out: 950 [Urine:950] Total I/O In: 240 [P.O.:240] Out: 850 [Urine:850]   Physical Exam: BP (!) 99/54 (BP Location: Right Arm)   Pulse 86   Temp 97.7 F (36.5 C) (Oral)   Resp 18   Ht 5\' 4"  (1.626 m)   Wt 174 lb 11.2 oz (79.2 kg) Comment: scale b  SpO2 95%   BMI 29.99 kg/m   Wt Readings from Last 3 Encounters:  09/20/16 174 lb 11.2 oz (79.2 kg)  09/19/16 177 lb (80.3 kg)  08/28/16 172 lb (78 kg)    General: Vital signs reviewed and noted.   Head: Normocephalic, atraumatic.  Eyes: conjunctivae/corneas clear.  EOM's intact.   Throat: normal  Neck:  normal   Lungs:    few rales in bases   Heart:  RR  Abdomen:  Soft, non-tender, non-distended    Extremities: 1+ pitting edema    Neurologic: A&O X3, CN II - XII are grossly intact.   Psych: Normal     Labs: BMET:  Recent Labs  09/20/16 0542 09/21/16 0449  NA 141 142  K 3.0* 3.7  CL 102 101  CO2 30 33*  GLUCOSE 86 106*  BUN 9 10  CREATININE 0.79 0.81  CALCIUM 9.0 9.1    Liver function tests:  Recent Labs  09/19/16 1800  AST 21  ALT 15    ALKPHOS 72  BILITOT 1.0  PROT 6.9  ALBUMIN 3.9   No results for input(s): LIPASE, AMYLASE in the last 72 hours.  CBC:  Recent Labs  09/19/16 1800  WBC 8.0  HGB 12.8  HCT 39.8  MCV 90.2  PLT 220    Cardiac Enzymes:  Recent Labs  09/19/16 1800 09/19/16 2301 09/20/16 0542  TROPONINI <0.03 <0.03 <0.03    Coagulation Studies: No results for input(s): LABPROT, INR in the last 72 hours.  Other: Invalid input(s): POCBNP No results for input(s): DDIMER in the last 72 hours. No results for input(s): HGBA1C in the last 72 hours. No results for input(s): CHOL, HDL, LDLCALC, TRIG, CHOLHDL in the last 72 hours. No results for input(s): TSH, T4TOTAL, T3FREE, THYROIDAB in the last 72 hours.  Invalid input(s): FREET3 No results for input(s): VITAMINB12, FOLATE, FERRITIN, TIBC, IRON, RETICCTPCT in the last 72 hours.   Other results:  EKG  ( personally reviewed )  -  NSR , RBBB   Medications:    Infusions:    Scheduled Medications: . ALPRAZolam  1 mg Oral BID  .  ARIPiprazole  10 mg Oral Daily  . aspirin EC  81 mg Oral QHS  . atorvastatin  20 mg Oral Daily  . DULoxetine  90 mg Oral Daily  . enoxaparin (LOVENOX) injection  40 mg Subcutaneous Q24H  . folic acid  2 mg Oral Daily  . furosemide  40 mg Intravenous BID  . isosorbide mononitrate  30 mg Oral Daily  . metoprolol succinate  25 mg Oral Daily  . mometasone-formoterol  2 puff Inhalation BID  . multivitamin with minerals  1 tablet Oral Daily  . oxyCODONE  10 mg Oral Q6H  . pantoprazole  40 mg Oral Daily  . potassium chloride SA  40 mEq Oral BID  . pregabalin  300 mg Oral QHS  . sodium chloride flush  3 mL Intravenous Q12H  . traZODone  150 mg Oral QHS    Assessment/ Plan:   Principal Problem:   Acute on chronic diastolic CHF (congestive heart failure), NYHA class 3 (HCC) Active Problems:   Chronic respiratory failure (HCC)   Chronic pain syndrome   Chest pain with moderate risk for cardiac etiology    Chest pain, moderate coronary artery risk  1. Acute on chronic diastolic CHF:   She is negative 2  liters so far. Still has leg edema Will continue the current IV lasix for another day  Consider changing to PO tomorrow Continue imdur, metoprolol Possible DC to home Sunday or Monday  troponins are normal  BNP is low - 14.   2. Hyperlipidemia :  Continue atorvastatin   Disposition:  Length of Stay: 2  Ramond Dial., MD, [facility] 09/21/2016, 12:03 PM Office [phone] Pager [phone]

## 2016-09-22 LAB — BASIC METABOLIC PANEL
ANION GAP: 11 (ref 5–15)
BUN: 11 mg/dL (ref 6–20)
CHLORIDE: 98 mmol/L — AB (ref 101–111)
CO2: 31 mmol/L (ref 22–32)
CREATININE: 0.81 mg/dL (ref 0.44–1.00)
Calcium: 9.6 mg/dL (ref 8.9–10.3)
GFR calc Af Amer: >60 mL/min (ref >60)
GFR calc non Af Amer: >60 mL/min (ref >60)
GLUCOSE: 105 mg/dL — AB (ref 65–99)
POTASSIUM: 4.1 mmol/L (ref 3.5–5.1)
SODIUM: 140 mmol/L (ref 135–145)

## 2016-09-22 MED ORDER — FUROSEMIDE 40 MG PO TABS: 40.0000 mg | ORAL_TABLET | Freq: Every day | ORAL | Status: DC

## 2016-09-22 MED ORDER — FUROSEMIDE 40 MG PO TABS
40.0000 mg | ORAL_TABLET | Freq: Every day | ORAL | Status: DC
  Administered 2016-09-23: 40 mg via ORAL
  Filled 2016-09-22: qty 1

## 2016-09-22 NOTE — Progress Notes (Addendum)
PROGRESS NOTE  Subjective:   70 y.o. female with a history of COPD on O2 3 lpm, HTN, ?orthostatic syncope, D-CHF, mod CAD by cath 2014 w/ med rx, fibromyalgia w/ chronic pain issues. RBBB and inf Q waves on ECG.  She was admitted from the office with increased shortness of breath and volume overload. She has diuresed 3.7 liters .  Is feeling slightly better   Objective:    Vital Signs:   Temp:  [97.7 F (36.5 C)-98 F (36.7 C)] 97.8 F (36.6 C) (10/01 0601) Pulse Rate:  [71-86] 71 (10/01 0601) Resp:  [16-18] 18 (10/01 0601) BP: (91-121)/(54-56) 121/56 (10/01 0601) SpO2:  [95 %-98 %] 98 % (10/01 0920) Weight:  [172 lb 4.8 oz (78.2 kg)] 172 lb 4.8 oz (78.2 kg) (10/01 0601)  Last BM Date: 09/18/16   24-hour weight change: Weight change:   Weight trends: Filed Weights   09/19/16 1749 09/20/16 0641 09/22/16 0601  Weight: 176 lb 8 oz (80.1 kg) 174 lb 11.2 oz (79.2 kg) 172 lb 4.8 oz (78.2 kg)    Intake/Output:  09/30 0701 - 10/01 0700 In: 600 [P.O.:600] Out: 2575 [Urine:2575] Total I/O In: 240 [P.O.:240] Out: 600 [Urine:600]   Physical Exam: BP (!) 121/56 (BP Location: Left Arm)   Pulse 71   Temp 97.8 F (36.6 C) (Oral)   Resp 18   Ht 5\' 4"  (1.626 m)   Wt 172 lb 4.8 oz (78.2 kg) Comment: scale b  SpO2 98%   BMI 29.58 kg/m   Wt Readings from Last 3 Encounters:  09/22/16 172 lb 4.8 oz (78.2 kg)  09/19/16 177 lb (80.3 kg)  08/28/16 172 lb (78 kg)    General: Vital signs reviewed and noted.   Head: Normocephalic, atraumatic.  Eyes: conjunctivae/corneas clear.  EOM's intact.   Throat: normal  Neck:  normal   Lungs:    few rales in bases   Heart:  RR  Abdomen:  Soft, non-tender, non-distended    Extremities: Trace  pitting edema  - much better   Neurologic: A&O X3, CN II - XII are grossly intact.   Psych: She has a flat affect.   Slow to answer questions    Labs: BMET:  Recent Labs  09/21/16 0449 09/22/16 0435  NA 142 140  K 3.7 4.1  CL  101 98*  CO2 33* 31  GLUCOSE 106* 105*  BUN 10 11  CREATININE 0.81 0.81  CALCIUM 9.1 9.6    Liver function tests:  Recent Labs  09/19/16 1800  AST 21  ALT 15  ALKPHOS 72  BILITOT 1.0  PROT 6.9  ALBUMIN 3.9   No results for input(s): LIPASE, AMYLASE in the last 72 hours.  CBC:  Recent Labs  09/19/16 1800  WBC 8.0  HGB 12.8  HCT 39.8  MCV 90.2  PLT 220    Cardiac Enzymes:  Recent Labs  09/19/16 1800 09/19/16 2301 09/20/16 0542  TROPONINI <0.03 <0.03 <0.03    Coagulation Studies: No results for input(s): LABPROT, INR in the last 72 hours.  Other: Invalid input(s): POCBNP No results for input(s): DDIMER in the last 72 hours. No results for input(s): HGBA1C in the last 72 hours. No results for input(s): CHOL, HDL, LDLCALC, TRIG, CHOLHDL in the last 72 hours. No results for input(s): TSH, T4TOTAL, T3FREE, THYROIDAB in the last 72 hours.  Invalid input(s): FREET3 No results for input(s): VITAMINB12, FOLATE, FERRITIN, TIBC, IRON, RETICCTPCT in the last 72 hours.  Other results:  EKG  ( personally reviewed )  -  NSR , RBBB   Medications:    Infusions:    Scheduled Medications: . ALPRAZolam  1 mg Oral BID  . ARIPiprazole  10 mg Oral Daily  . aspirin EC  81 mg Oral QHS  . atorvastatin  20 mg Oral Daily  . DULoxetine  90 mg Oral Daily  . enoxaparin (LOVENOX) injection  40 mg Subcutaneous Q24H  . folic acid  2 mg Oral Daily  . furosemide  40 mg Intravenous BID  . isosorbide mononitrate  30 mg Oral Daily  . metoprolol succinate  25 mg Oral Daily  . mometasone-formoterol  2 puff Inhalation BID  . multivitamin with minerals  1 tablet Oral Daily  . oxyCODONE  10 mg Oral Q6H  . pantoprazole  40 mg Oral Daily  . potassium chloride SA  40 mEq Oral BID  . pregabalin  300 mg Oral QHS  . sodium chloride flush  3 mL Intravenous Q12H  . traZODone  150 mg Oral QHS    Assessment/ Plan:   Principal Problem:   Acute on chronic diastolic CHF (congestive  heart failure), NYHA class 3 (HCC) Active Problems:   Chronic respiratory failure (HCC)   Chronic pain syndrome   Chest pain with moderate risk for cardiac etiology   Chest pain, moderate coronary artery risk  1. Acute on chronic diastolic CHF:   She is negative  3.7   liters so far. Leg edema is considerably better Only has trace edema now  Possible  DC  Monday , she did not think she was ready for DC today   troponins are normal  BNP is low - 14.   2. Hyperlipidemia :  Continue atorvastatin   Disposition:  Length of Stay: 3  Thayer Headings, Brooke Bonito., MD, [facility] 09/22/2016, 10:58 AM Office [phone] Pager [phone]

## 2016-09-23 DIAGNOSIS — I4510 Unspecified right bundle-branch block: Secondary | ICD-10-CM | POA: Diagnosis present

## 2016-09-23 MED ORDER — POTASSIUM CHLORIDE CRYS ER 20 MEQ PO TBCR: 40.0000 meq | EXTENDED_RELEASE_TABLET | Freq: Every day | ORAL | Status: DC

## 2016-09-23 NOTE — Care Management Important Message (Signed)
Important Message  Patient Details  Name: TRENELL RAYL MRN: [id number] Date of Birth: [birth year]   Medicare Important Message Given:  Yes    Kassie Keng 09/23/2016, 1:09 PM

## 2016-09-23 NOTE — Progress Notes (Signed)
PROGRESS NOTE  Subjective:   70 y.o. female with a history of COPD on O2 3 lpm, HTN, ?orthostatic syncope, D-CHF, mod CAD by cath 2014 w/ med rx, fibromyalgia w/ chronic pain issues. RBBB and inf Q waves on ECG.  She was admitted from the office with increased shortness of breath and volume overload.  She has diuresed 3.8 liters .  Is feeling better. Slow talker.    Objective:    Vital Signs:   Temp:  [97.8 F (36.6 C)-98.1 F (36.7 C)] 98 F (36.7 C) (10/02 0602) Pulse Rate:  [70-86] 77 (10/02 1017) Resp:  [17-18] 17 (10/02 0602) BP: (101-119)/(55-61) 101/61 (10/02 1017) SpO2:  [96 %-98 %] 96 % (10/02 0602) Weight:  [176 lb 3.2 oz (79.9 kg)] 176 lb 3.2 oz (79.9 kg) (10/02 0602)  Last BM Date: 09/18/16   24-hour weight change: Weight change: 3 lb 14.4 oz (1.769 kg)  Weight trends: Filed Weights   09/20/16 0641 09/22/16 0601 09/23/16 0602  Weight: 174 lb 11.2 oz (79.2 kg) 172 lb 4.8 oz (78.2 kg) 176 lb 3.2 oz (79.9 kg)    Intake/Output:  10/01 0701 - 10/02 0700 In: 960 [P.O.:960] Out: 1150 [Urine:1150] Total I/O In: 240 [P.O.:240] Out: 550 [Urine:550]   Physical Exam: BP 101/61   Pulse 77   Temp 98 F (36.7 C) (Oral)   Resp 17   Ht 5\' 4"  (1.626 m)   Wt 176 lb 3.2 oz (79.9 kg)   SpO2 96%   BMI 30.24 kg/m   Wt Readings from Last 3 Encounters:  09/23/16 176 lb 3.2 oz (79.9 kg)  09/19/16 177 lb (80.3 kg)  08/28/16 172 lb (78 kg)    General: Vital signs reviewed and noted.   Head: Normocephalic, atraumatic.  Eyes: conjunctivae/corneas clear.  EOM's intact.   Throat: normal  Neck:  normal   Lungs:    few rales in bases   Heart:  RR  Abdomen:  Soft, non-tender, non-distended    Extremities: Trace  pitting edema  - much better   Neurologic: A&O X3, CN II - XII are grossly intact.   Psych: She has a flat affect.   Slow to answer questions    Labs: BMET:  Recent Labs  09/21/16 0449 09/22/16 0435  NA 142 140  K 3.7 4.1  CL 101 98*    CO2 33* 31  GLUCOSE 106* 105*  BUN 10 11  CREATININE 0.81 0.81  CALCIUM 9.1 9.6      Other results:  EKG  ( personally reviewed )  -  NSR , RBBB   Medications:    Infusions:    Scheduled Medications: . ALPRAZolam  1 mg Oral BID  . ARIPiprazole  10 mg Oral Daily  . aspirin EC  81 mg Oral QHS  . atorvastatin  20 mg Oral Daily  . DULoxetine  90 mg Oral Daily  . enoxaparin (LOVENOX) injection  40 mg Subcutaneous Q24H  . folic acid  2 mg Oral Daily  . furosemide  40 mg Oral Daily  . isosorbide mononitrate  30 mg Oral Daily  . metoprolol succinate  25 mg Oral Daily  . mometasone-formoterol  2 puff Inhalation BID  . multivitamin with minerals  1 tablet Oral Daily  . oxyCODONE  10 mg Oral Q6H  . pantoprazole  40 mg Oral Daily  . potassium chloride SA  40 mEq Oral BID  . pregabalin  300 mg Oral QHS  . sodium  chloride flush  3 mL Intravenous Q12H  . traZODone  150 mg Oral QHS    Assessment/ Plan:   Principal Problem:   Acute on chronic diastolic CHF (congestive heart failure), NYHA class 3 (HCC) Active Problems:   Chronic respiratory failure (HCC)   Chronic pain syndrome   Chest pain with moderate risk for cardiac etiology   Chest pain, moderate coronary artery risk  1. Acute on chronic diastolic CHF:   She is negative  3.8 liters. Leg edema is considerably better No edema now  DC home troponins are normal  BNP is low - 14.   2. Hyperlipidemia :  Continue atorvastatin   Candee Furbish, MD

## 2016-09-23 NOTE — Discharge Instructions (Signed)
Heart Failure  Heart failure means your heart has trouble pumping blood. This makes it hard for your body to work well. Heart failure is usually a long-term (chronic) condition. You must take good care of yourself and follow your doctor's treatment plan.  HOME CARE   Take your heart medicine as told by your doctor.    Do not stop taking medicine unless your doctor tells you to.    Do not skip any dose of medicine.    Refill your medicines before they run out.    Take other medicines only as told by your doctor or pharmacist.   Stay active if told by your doctor. The elderly and people with severe heart failure should talk with a doctor about physical activity.   Eat heart-healthy foods. Choose foods that are without trans fat and are low in saturated fat, cholesterol, and salt (sodium). This includes fresh or frozen fruits and vegetables, fish, lean meats, fat-free or low-fat dairy foods, whole grains, and high-fiber foods. Lentils and dried peas and beans (legumes) are also good choices.   Limit salt if told by your doctor.   Cook in a healthy way. Roast, grill, broil, bake, poach, steam, or stir-fry foods.   Limit fluids as told by your doctor.   Weigh yourself every morning. Do this after you pee (urinate) and before you eat breakfast. Write down your weight to give to your doctor.   Take your blood pressure and write it down if your doctor tells you to.   Ask your doctor how to check your pulse. Check your pulse as told.   Lose weight if told by your doctor.   Stop smoking or chewing tobacco. Do not use gum or patches that help you quit without your doctor's approval.   Schedule and go to doctor visits as told.   Nonpregnant women should have no more than 1 drink a day. Men should have no more than 2 drinks a day. Talk to your doctor about drinking alcohol.   Stop illegal drug use.   Stay current with shots (immunizations).   Manage your health conditions as told by your doctor.   Learn to  manage your stress.   Rest when you are tired.   If it is really hot outside:    Avoid intense activities.    Use air conditioning or fans, or get in a cooler place.    Avoid caffeine and alcohol.    Wear loose-fitting, lightweight, and light-colored clothing.   If it is really cold outside:    Avoid intense activities.    Layer your clothing.    Wear mittens or gloves, a hat, and a scarf when going outside.    Avoid alcohol.   Learn about heart failure and get support as needed.   Get help to maintain or improve your quality of life and your ability to care for yourself as needed.  GET HELP IF:    You gain weight quickly.   You are more short of breath than usual.   You cannot do your normal activities.   You tire easily.   You cough more than normal, especially with activity.   You have any or more puffiness (swelling) in areas such as your hands, feet, ankles, or belly (abdomen).   You cannot sleep because it is hard to breathe.   You feel like your heart is beating fast (palpitations).   You get dizzy or light-headed when you stand up.  GET HELP   RIGHT AWAY IF:    You have trouble breathing.   There is a change in mental status, such as becoming less alert or not being able to focus.   You have chest pain or discomfort.   You faint.  MAKE SURE YOU:    Understand these instructions.   Will watch your condition.   Will get help right away if you are not doing well or get worse.     This information is not intended to replace advice given to you by your health care provider. Make sure you discuss any questions you have with your health care provider.     Document Released: 09/17/2008 Document Revised: 12/30/2014 Document Reviewed: 01/25/2013  [employer] Interactive Patient Education 2016 [employer]

## 2016-09-23 NOTE — Discharge Summary (Signed)
Patient ID: Linda [name],  MRN: [id number], DOB/AGE: 07-22-1946 70 y.o.  Admit date: 09/19/2016 Discharge date: 09/23/2016  Primary Care Provider: Tammi Sou, MD Primary Cardiologist: Dr Stanford Breed  Discharge Diagnoses Principal Problem:   Acute on chronic diastolic CHF (congestive heart failure), NYHA class 3 (HCC) Active Problems:   Fibromyalgia syndrome   Chronic respiratory failure (HCC)   Chronic pain syndrome   CAD-minor 2014   Chest pain with moderate risk for cardiac etiology   RBBB    Hospital Course:  70 y.o. female followed by Dr Stanford Breed with a history of COPD on O2 3 lpm, HTN, ?orthostatic syncope, D-CHF, mod CAD by cath 2014 - med rx, fibromyalgia w/ chronic pain issues. RBBB and inf Q waves on ECG. She was seen in the office 09/19/16 by Dr Claiborne Billings. She had recently been on vacation with her family and did not gain any weight. She stated her weight was 169 pounds when she got back. However, since then, her weight began to trend up. She denies dietary indiscretions.  When seen in the office her wgt was 177. In association with the increase in her weight, she had also noticed increasing lower extremity edema and orthopnea. She was admitted to [facility] telemetry and placed on IV diuretics. She diuresed 3.8L with improvement in he symptoms. It's not clear her wgts were accurate as they ranged from 176 on admission to 171 on 10/1, to 176 at discharge. As noted though she did improve symptomatically and Dr Marlou Porch feels she can be discharged 09/23/16 with plans for OP f/u in a week with an APP. Her home medications are unchanged.    Discharge Vitals:  Blood pressure 101/61, pulse 77, temperature 98 F (36.7 C), temperature source Oral, resp. rate 17, height 5\' 4"  (1.626 m), weight 176 lb 3.2 oz (79.9 kg), SpO2 96 %.    Labs: No results found for this or any previous visit (from the past 24 hour(s)).  Disposition:  Follow-up Information    [name], MD.     Specialty:  Cardiology Why:  Office will contact you Contact information: [address] [place] [address] [phone]        Tammi Sou, MD. Daphane Shepherd on 10/01/2016.   Specialty:  Family Medicine Why:  @ 1130am Contact information: [address] [place] [address] [phone]           Discharge Medications:    Medication List    STOP taking these medications   ibuprofen 800 MG tablet Commonly known as:  ADVIL,MOTRIN     TAKE these medications   acetaminophen 325 MG tablet Commonly known as:  TYLENOL Take 2 tablets (650 mg total) by mouth every 4 (four) hours as needed for headache or mild pain.   albuterol 108 (90 Base) MCG/ACT inhaler Commonly known as:  PROVENTIL HFA;VENTOLIN HFA Inhale 2 puffs into the lungs every 6 (six) hours as needed for wheezing or shortness of breath.   ALPRAZolam 1 MG tablet Commonly known as:  XANAX Take 1 mg by mouth See admin instructions. Pt takes 1 tablet every morning and 1 tablet every night at bedtime, and may take a dose throughout the day as needed   ARIPiprazole 10 MG tablet Commonly known as:  ABILIFY Take 10 mg by mouth daily.   aspirin 81 MG tablet Take 81 mg by mouth at bedtime.   atorvastatin 20 MG tablet Commonly known as:  LIPITOR Take 20 mg by mouth  at bedtime.   budesonide-formoterol 160-4.5 MCG/ACT inhaler Commonly known as:  SYMBICORT Inhale 2 puffs into the lungs 2 (two) times daily.   diclofenac sodium 1 % Gel Commonly known as:  VOLTAREN Apply 1 application topically 3 (three) times daily as needed (pain).   DULoxetine 30 MG capsule Commonly known as:  CYMBALTA Take 30 mg by mouth at bedtime.   DULoxetine 60 MG capsule Commonly known as:  CYMBALTA Take 60 mg by mouth at bedtime. TO BE COMBINED WITH 30 MG TAB TO EQUAL 90 MG   folic acid 1 MG tablet Commonly known as:  FOLVITE Take 2 tablets (2 mg total) by mouth daily.   furosemide 40 MG tablet Commonly known  as:  LASIX Take 40 mg by mouth See admin instructions. Pt takes 1 tablet at 0900 every morning, and may Take 1 extra tablets for swelling in the afternoon at 1400.   isosorbide mononitrate 30 MG 24 hr tablet Commonly known as:  IMDUR Take 30 mg by mouth daily.   metoprolol succinate 25 MG 24 hr tablet Commonly known as:  TOPROL XL Take 1 tablet (25 mg total) by mouth daily.   multivitamin with minerals Tabs tablet Take 1 tablet by mouth daily.   nitroGLYCERIN 0.4 MG SL tablet Commonly known as:  NITROSTAT Place 0.4 mg under the tongue every 5 (five) minutes as needed for chest pain (x 3 doses).   ondansetron 4 MG disintegrating tablet Commonly known as:  ZOFRAN-ODT Take 4 mg by mouth every 8 (eight) hours as needed for nausea or vomiting.   Oxycodone HCl 10 MG Tabs Take 1 tablet (10 mg total) by mouth every 6 (six) hours.   OXYGEN Inhale 3 L into the lungs continuous.   pantoprazole 40 MG tablet Commonly known as:  PROTONIX Take 40 mg by mouth daily.   polyethylene glycol packet Commonly known as:  MIRALAX / GLYCOLAX Take 17 g by mouth daily as needed (constipation). Mix in 8 oz liquid and drink   potassium chloride SA 20 MEQ tablet Commonly known as:  K-DUR,KLOR-CON Take 2 tablets (40 mEq total) by mouth daily. Take (40 meq) by mouth every morning. Take 1 tablet whenever you take extra tablet of Lasix. What changed:  how much to take  how to take this  when to take this  additional instructions   pregabalin 300 MG capsule Commonly known as:  LYRICA Take 300 mg by mouth at bedtime.   traZODone 50 MG tablet Commonly known as:  DESYREL Take 100-200 mg by mouth See admin instructions. INSOMNIA. Patient may take 2, 3 or 4 tablets at bedtime .   Vitamin D3 50000 units Caps Take 1 capsule by mouth every Monday.        Duration of Discharge Encounter: Greater than 30 minutes including physician time.  Angelena Form PA-C 09/23/2016 11:45 AM    Personally seen and examined. Agree with above. Subjective:   70 y.o. female with a history of COPD on O2 3 lpm, HTN, ?orthostatic syncope, D-CHF, mod CAD by cath 2014 w/ med rx, fibromyalgia w/ chronic pain issues. RBBB and inf Q waves on ECG.  She was admitted from the office with increased shortness of breath and volume overload.  She has diuresed 3.8 liters .  Is feeling better. Slow talker.    Objective:    Vital Signs:                 Temp:  [97.8 F (36.6 C)-98.1 F (  36.7 C)] 98 F (36.7 C) (10/02 0602) Pulse Rate:  [70-86] 77 (10/02 1017) Resp:  [17-18] 17 (10/02 0602) BP: (101-119)/(55-61) 101/61 (10/02 1017) SpO2:  [96 %-98 %] 96 % (10/02 0602) Weight:  [176 lb 3.2 oz (79.9 kg)] 176 lb 3.2 oz (79.9 kg) (10/02 0602)  Last BM Date: 09/18/16   24-hour weight change: Weight change: 3 lb 14.4 oz (1.769 kg)  Weight trends:      Filed Weights   09/20/16 0641 09/22/16 0601 09/23/16 0602  Weight: 174 lb 11.2 oz (79.2 kg) 172 lb 4.8 oz (78.2 kg) 176 lb 3.2 oz (79.9 kg)    Intake/Output:            10/01 0701 - 10/02 0700 In: 960 [P.O.:960] Out: 1150 [Urine:1150] Total I/O In: 240 [P.O.:240] Out: 550 [Urine:550]   Physical Exam: BP 101/61   Pulse 77   Temp 98 F (36.7 C) (Oral)   Resp 17   Ht 5\' 4"  (1.626 m)   Wt 176 lb 3.2 oz (79.9 kg)   SpO2 96%   BMI 30.24 kg/m      Wt Readings from Last 3 Encounters:  09/23/16 176 lb 3.2 oz (79.9 kg)  09/19/16 177 lb (80.3 kg)  08/28/16 172 lb (78 kg)    General: Vital signs reviewed and noted.   Head: Normocephalic, atraumatic.  Eyes: conjunctivae/corneas clear.  EOM's intact.   Throat: normal  Neck:  normal   Lungs:    few rales in bases   Heart:  RR  Abdomen:  Soft, non-tender, non-distended    Extremities: Trace  pitting edema  - much better   Neurologic: A&O X3, CN II - XII are grossly intact.   Psych: She has a flat affect.   Slow to answer questions    Labs: BMET:  Recent  Labs (last 2 labs)    Recent Labs  09/21/16 0449 09/22/16 0435  NA 142 140  K 3.7 4.1  CL 101 98*  CO2 33* 31  GLUCOSE 106* 105*  BUN 10 11  CREATININE 0.81 0.81  CALCIUM 9.1 9.6        Other results:  EKG  ( personally reviewed )  -  NSR , RBBB   Medications:    Infusions:    Scheduled Medications: . ALPRAZolam  1 mg Oral BID  . ARIPiprazole  10 mg Oral Daily  . aspirin EC  81 mg Oral QHS  . atorvastatin  20 mg Oral Daily  . DULoxetine  90 mg Oral Daily  . enoxaparin (LOVENOX) injection  40 mg Subcutaneous Q24H  . folic acid  2 mg Oral Daily  . furosemide  40 mg Oral Daily  . isosorbide mononitrate  30 mg Oral Daily  . metoprolol succinate  25 mg Oral Daily  . mometasone-formoterol  2 puff Inhalation BID  . multivitamin with minerals  1 tablet Oral Daily  . oxyCODONE  10 mg Oral Q6H  . pantoprazole  40 mg Oral Daily  . potassium chloride SA  40 mEq Oral BID  . pregabalin  300 mg Oral QHS  . sodium chloride flush  3 mL Intravenous Q12H  . traZODone  150 mg Oral QHS    Assessment/ Plan:   Principal Problem:   Acute on chronic diastolic CHF (congestive heart failure), NYHA class 3 (HCC) Active Problems:   Chronic respiratory failure (HCC)   Chronic pain syndrome   Chest pain with moderate risk for cardiac etiology   Chest pain,  moderate coronary artery risk  1. Acute on chronic diastolic CHF:   She is negative  3.8 liters. Leg edema is considerably better No edema now  DC home troponins are normal  BNP is low - 14.   2. Hyperlipidemia :  Continue atorvastatin   Candee Furbish, MD

## 2016-09-25 ENCOUNTER — Telehealth: Payer: Self-pay | Admitting: Family Medicine

## 2016-09-25 NOTE — Telephone Encounter (Signed)
Tried to contact patient to discuss TCM.   Left message for pt to CB.

## 2016-09-26 ENCOUNTER — Telehealth: Payer: Self-pay | Admitting: Cardiology

## 2016-09-26 NOTE — Telephone Encounter (Signed)
Closed encounter °

## 2016-09-27 NOTE — Telephone Encounter (Signed)
Pt hasn't returned my call, but per [name].  She doesn't need to come here for TCM.

## 2016-09-30 ENCOUNTER — Other Ambulatory Visit: Payer: Self-pay | Admitting: *Deleted

## 2016-09-30 NOTE — Patient Outreach (Addendum)
[facility]) Care Management  09/30/2016  [name] 03/28/1946 [id number]   RN received a referral to follow up with pt who is in the RED zone on the EMMI report. RN contact pt and inquired further after reintroduction of the [facility] program and services. Pt reports she is a little SOB today both at rest and during activities. Pt states he has permission from her provider to take an extra fluid medication when needed with weight gain or fluid retention. RN inquired about pt's weight as she reports last week 168 lbs, yesterday 171 lbs and today 174 lbs (3 lbs gained overnight). Pt will take the extra fluid medication. RN strongly encouraged pt to contact her provider if symptoms do not improve with the added medication (pt with understanding). RN also offered community home visits to assist further with managing her HF (pt declined). Another option offered for health coaching however pt opt to decline this service also. Due to pt's extra fluid retention with SOB RN offered to follow up tomorrow to inquire further on any improvement. Pt receptive and allowed RN to scheduled a follow up call tomorrow.   [name], RN Care Management Coordinator [place] Office [phone]

## 2016-10-01 ENCOUNTER — Other Ambulatory Visit: Payer: Self-pay | Admitting: *Deleted

## 2016-10-01 ENCOUNTER — Encounter: Payer: Self-pay | Admitting: Family Medicine

## 2016-10-01 ENCOUNTER — Ambulatory Visit (INDEPENDENT_AMBULATORY_CARE_PROVIDER_SITE_OTHER): Payer: Medicare Other | Admitting: Family Medicine

## 2016-10-01 VITALS — BP 143/78 | HR 77 | Temp 97.9°F | Resp 18 | Wt 172.8 lb

## 2016-10-01 DIAGNOSIS — Z23 Encounter for immunization: Secondary | ICD-10-CM | POA: Diagnosis not present

## 2016-10-01 DIAGNOSIS — I2510 Atherosclerotic heart disease of native coronary artery without angina pectoris: Secondary | ICD-10-CM | POA: Diagnosis not present

## 2016-10-01 DIAGNOSIS — I2583 Coronary atherosclerosis due to lipid rich plaque: Secondary | ICD-10-CM | POA: Diagnosis not present

## 2016-10-01 DIAGNOSIS — I5032 Chronic diastolic (congestive) heart failure: Secondary | ICD-10-CM

## 2016-10-01 DIAGNOSIS — N3000 Acute cystitis without hematuria: Secondary | ICD-10-CM | POA: Diagnosis not present

## 2016-10-01 LAB — POCT URINALYSIS DIPSTICK
Blood, UA: NEGATIVE
Glucose, UA: NEGATIVE
Nitrite, UA: POSITIVE
PH UA: 6
SPEC GRAV UA: 1.02
UROBILINOGEN UA: 1

## 2016-10-01 LAB — BASIC METABOLIC PANEL
BUN: 11 mg/dL (ref 6–23)
CHLORIDE: 101 meq/L (ref 96–112)
CO2: 32 mEq/L (ref 19–32)
CREATININE: 0.88 mg/dL (ref 0.40–1.20)
Calcium: 9.8 mg/dL (ref 8.4–10.5)
GFR: 67.41 mL/min (ref >60.00)
Glucose, Bld: 92 mg/dL (ref 70–99)
POTASSIUM: 3.9 meq/L (ref 3.5–5.1)
Sodium: 142 mEq/L (ref 135–145)

## 2016-10-01 MED ORDER — CIPROFLOXACIN HCL 500 MG PO TABS: 500.0000 mg | ORAL_TABLET | Freq: Two times a day (BID) | ORAL | 0 refills | Status: AC

## 2016-10-01 NOTE — Progress Notes (Signed)
Pre visit review using our clinic review tool, if applicable. No additional management support is needed unless otherwise documented below in the visit note. 

## 2016-10-01 NOTE — Progress Notes (Signed)
10/01/2016  CC:  Chief Complaint  Patient presents with  . Hospitalization Follow-up    Heart failure    Patient is a 70 y.o. Caucasian female who presents for  hospital follow up. Dates hospitalized: 9/28-10/2, 2017. Days since d/c from hospital: 8 Patient was discharged from hospital to home.  Has home health aid. Reason for admission to hospital: acute on chronic diastolic HF, NYHA class 3. She diuresed 3.8L in hosp and was feeling improved at d/c.   I have reviewed patient's discharge summary plus pertinent specific notes, labs, and imaging from the hospitalization.    Since getting out of hosp it sounds like pt has had some days of feeling dizzy and having orthopnea, but other days in which she was not bothered by this.  Wt 168-175 at home.  If her wt goes up 3 lbs in 1 day she has permission to take an afternoon dose of lasix.  She has done this about every 3rd day since d/c home. No fevers.  No change in baseline SOB/DOE, oxygen sats 90-95% consistently at home on 3L oxygen. Eating and drinking fine.   Started having urinary burning, urgency, and frequency, and foul odor to urine PRIOR to d/c from hospital.  Medication reconciliation was done today and patient is taking meds as recommended by discharging hospitalist/specialist.    PMH:  Past Medical History:  Diagnosis Date  . Acute upper GI bleed 06/2014   while pt taking coumadin, plavix, and meloxicam---despite being told not to take coumadin.  . Anginal pain ([place])    Nonobstructive CAD 2014; however, her cardiologist put her on a statin for this and NOT for hyperlipidemia per pt report.  . Anxiety    panic attacks  . Asthma    w/ asbestososis   . BPPV (benign paroxysmal positional vertigo) 12/16/2012  . Chronic lower back pain   . COPD (chronic obstructive pulmonary disease) ([place])   . DDD (degenerative disc disease)    lumbar and cervical.   . Depression   . Diastolic congestive heart failure ([place])    zaroxolyn  2.5mg  q week prn dose added 06/27/16 by cardiology  . Diverticular disease   . Fibromyalgia    Patient states dx was around her late 32s but she had sx's for years prior to this.  . H/O hiatal hernia   . Hay fever   . History of pneumonia    hospitalized 12/2011, 02/2013, and 07/2013 [facility]) for this  . HTN (hypertension)   . Idiopathic angio-edema-urticaria 72014   Angioedema component was very minimal  . Insomnia   . Iron deficiency anemia    Hematologist in [place], [place] did extensive w/u; no cause found; failed oral supplement;; gets fairly regular (q69m or so) IV iron infusions (Venofer -iron sucrose- 200mg  with procrit.  "for 14 yr I've been getting blood work q month & getting infusions prn" (07/12/2013).  Dr. Marin Olp locally, iron infusions done, EPO deficiency dx'd  . Migraine syndrome    "not as often anymore; used to be ~ q wk" (07/12/2013)  . Mixed incontinence urge and stress   . Nephrolithiasis    "passed all on my own or they are still in there" (07/12/2013)  . OSA on CPAP    prior to move to [place]--had another sleep study 10/2015 w/pulm Dr. Camillo Flaming.  . Osteoarthritis    "severe; progressing fast" (07/12/2013); multiple joints-not surgical candidate for TKR (03/2015)  . Pernicious anemia 08/24/2014  . Pleural plaque with presence of asbestos 07/22/2013  .  Pulmonary embolism ([place]) 07/2013   Dx at [facility] with very small peripheral upper lobe pe 07/2013: pt took coumadin for about 8-9 mo  . Pyelonephritis    "several times over the last 30 yr" (07/12/2013)  . RBBB (right bundle branch block)   . Recurrent UTI    hx of hospitalization for pyelonephritis; started abx prophylaxis 06/2015  . Syncope    Hypotensive; ED visit--Dr. Terrence Dupont did Cath--nonobstructive CAD, EF 55-60%.  In retrospect, suspect pt rx med misuse/polypharmacy  . Tension headache, chronic     PSH:  Past Surgical History:  Procedure Laterality Date  . APPENDECTOMY  1960  . AXILLARY SURGERY Left 1978   Multiple "lump" in  armpit per pt  . CARDIAC CATHETERIZATION  01/2013   nonobstructive CAD, EF 55-60%  . CARDIOVASCULAR STRESS TEST  02/22/15   Low risk myocard perf imaging; wall motion normal, normal EF  . COCCYX REMOVAL  1972  . DILATION AND CURETTAGE OF UTERUS  ? 1970's  . ESOPHAGOGASTRODUODENOSCOPY N/A 07/19/2014   Gastritis found + in the setting of supratherapeutic INR, +plavix, + meloxicam.  . EYE SURGERY Left 2012-2013   "injections for ~ 1 yr; don't really know what for" (07/12/2013)  . HEEL SPUR SURGERY Left 2008  . KNEE SURGERY  2005  . LEFT HEART CATHETERIZATION WITH CORONARY ANGIOGRAM N/A 01/30/2013   Procedure: LEFT HEART CATHETERIZATION WITH CORONARY ANGIOGRAM;  Surgeon: Clent Demark, MD;  Location: [facility] CATH LAB;  Service: Cardiovascular;  Laterality: N/A;  . PLANTAR FASCIA RELEASE Left 2008  . SPIROMETRY  04/25/14   In hosp for acute asthma/COPD flare: mixed obstructive and restrictive lung disease. The FEV1 is severely reduced at 45% predicted.  FEV1 signif decreased compared to prior spirometry 07/23/13.  . TENDON RELEASE  1996   Right forearm and hand  . TOTAL ABDOMINAL HYSTERECTOMY  1974  . TRANSTHORACIC ECHOCARDIOGRAM  01/2013; 04/2014;08/2015   2014--NORMAL.  2015--focal basal septal hypertrophy, EF 55-60%, grade I diast dysfxn, mild LAE.  08/2015 EF 55-60%, nl LV syst fxn, grade I DD, valves wnl    MEDS:  Outpatient Medications Prior to Visit  Medication Sig Dispense Refill  . acetaminophen (TYLENOL) 325 MG tablet Take 2 tablets (650 mg total) by mouth every 4 (four) hours as needed for headache or mild pain.    [name] Kitchen albuterol (PROVENTIL HFA;VENTOLIN HFA) 108 (90 BASE) MCG/ACT inhaler Inhale 2 puffs into the lungs every 6 (six) hours as needed for wheezing or shortness of breath. 1 Inhaler 1  . ALPRAZolam (XANAX) 1 MG tablet Take 1 mg by mouth See admin instructions. Pt takes 1 tablet every morning and 1 tablet every night at bedtime, and may take a dose throughout the day as needed    .  ARIPiprazole (ABILIFY) 10 MG tablet Take 10 mg by mouth daily.    [name] Kitchen aspirin 81 MG tablet Take 81 mg by mouth at bedtime.    [name] Kitchen atorvastatin (LIPITOR) 20 MG tablet Take 20 mg by mouth at bedtime.     . budesonide-formoterol (SYMBICORT) 160-4.5 MCG/ACT inhaler Inhale 2 puffs into the lungs 2 (two) times daily. 1 Inhaler 12  . Cholecalciferol (VITAMIN D3) 50000 units CAPS Take 1 capsule by mouth every Monday.     . diclofenac sodium (VOLTAREN) 1 % GEL Apply 1 application topically 3 (three) times daily as needed (pain).     . DULoxetine (CYMBALTA) 30 MG capsule Take 30 mg by mouth at bedtime.     . DULoxetine (CYMBALTA) 60  MG capsule Take 60 mg by mouth at bedtime. TO BE COMBINED WITH 30 MG TAB TO EQUAL 90 MG     . folic acid (FOLVITE) 1 MG tablet Take 2 tablets (2 mg total) by mouth daily. 60 tablet 6  . furosemide (LASIX) 40 MG tablet Take 40 mg by mouth See admin instructions. Pt takes 1 tablet at 0900 every morning, and may Take 1 extra tablets for swelling in the afternoon at 1400.    [name] Kitchen isosorbide mononitrate (IMDUR) 30 MG 24 hr tablet Take 30 mg by mouth daily.    . metoprolol succinate (TOPROL XL) 25 MG 24 hr tablet Take 1 tablet (25 mg total) by mouth daily. 30 tablet 11  . Multiple Vitamin (MULTIVITAMIN WITH MINERALS) TABS tablet Take 1 tablet by mouth daily.    . nitroGLYCERIN (NITROSTAT) 0.4 MG SL tablet Place 0.4 mg under the tongue every 5 (five) minutes as needed for chest pain (x 3 doses).     . ondansetron (ZOFRAN-ODT) 4 MG disintegrating tablet Take 4 mg by mouth every 8 (eight) hours as needed for nausea or vomiting.    . Oxycodone HCl 10 MG TABS Take 1 tablet (10 mg total) by mouth every 6 (six) hours. 100 tablet 0  . OXYGEN Inhale 3 L into the lungs continuous.    . pantoprazole (PROTONIX) 40 MG tablet Take 40 mg by mouth daily.    . polyethylene glycol (MIRALAX / GLYCOLAX) packet Take 17 g by mouth daily as needed (constipation). Mix in 8 oz liquid and drink    . potassium  chloride SA (K-DUR,KLOR-CON) 20 MEQ tablet Take 2 tablets (40 mEq total) by mouth daily. Take (40 meq) by mouth every morning. Take 1 tablet whenever you take extra tablet of Lasix.    . pregabalin (LYRICA) 300 MG capsule Take 300 mg by mouth at bedtime.     . traZODone (DESYREL) 50 MG tablet Take 100-200 mg by mouth See admin instructions. INSOMNIA. Patient may take 2, 3 or 4 tablets at bedtime .     Facility-Administered Medications Prior to Visit  Medication Dose Route Frequency Provider Last Rate Last Dose  . ferumoxytol (FERAHEME) 510 mg in sodium chloride 0.9 % 100 mL IVPB  510 mg Intravenous Once Volanda Napoleon, MD       EXAM: BP (!) 143/78 (BP Location: Left Arm, Patient Position: Sitting, Cuff Size: Normal)   Pulse 77   Temp 97.9 F (36.6 C) (Oral)   Resp 18   Wt 172 lb 12.8 oz (78.4 kg)   SpO2 95% Comment: 3 L of O2  BMI 29.66 kg/m  Gen: Alert, well appearing.  Patient is oriented to person, place, time, and situation. AFFECT: blunted affect, slow talker, lucid thought and speech. [id number]: no injection, icteris, swelling, or exudate.  EOMI, PERRLA. Mouth: lips without lesion/swelling.  Oral mucosa pink and moist. Oropharynx without erythema, exudate, or swelling.  CV: RRR with occ ectopic beat, no m/r/g.   LUNGS: CTA bilat, nonlabored resps, good aeration in all lung fields. EXT: no c/c/e  Pertinent labs/imaging  CC UA today: trace ketones, trace protein, +nitrites, small LEU, no blood.  Lab Results  Component Value Date   WBC 8.0 09/19/2016   HGB 12.8 09/19/2016   HCT 39.8 09/19/2016   MCV 90.2 09/19/2016   PLT 220 09/19/2016     Chemistry      Component Value Date/Time   NA 140 09/22/2016 0435   NA 141 06/05/2015 1315  K 4.1 09/22/2016 0435   K 3.9 06/05/2015 1315   CL 98 (L) 09/22/2016 0435   CL 103 06/05/2015 1315   CO2 31 09/22/2016 0435   CO2 28 06/05/2015 1315   BUN 11 09/22/2016 0435   BUN 14 06/05/2015 1315   CREATININE 0.81 09/22/2016 0435    CREATININE 0.88 08/28/2016 1413      Component Value Date/Time   CALCIUM 9.6 09/22/2016 0435   CALCIUM 9.0 06/05/2015 1315   ALKPHOS 72 09/19/2016 1800   ALKPHOS 84 06/05/2015 1315   AST 21 09/19/2016 1800   AST 23 06/05/2015 1315   ALT 15 09/19/2016 1800   ALT 17 06/05/2015 1315   BILITOT 1.0 09/19/2016 1800   BILITOT 1.00 06/05/2015 1315       ASSESSMENT/PLAN:  1) Chronic diastolic heart failure: stable at this time.  Monitor wt's, take 40mg  lasix qAM and then add a 2nd dose of 40mg  lasix if wt increased 3 lb over 24h or if she notices fluid retention in legs or begins to note more orthopnea.  She is to see cardiology for f/u 10/11/16. BMET today.  2) UTI: start cipro 500 mg bid x 5d.  Hx of recurrent UTIs. Sent urine for c/s.  An After Visit Summary was printed and given to the patient.  FOLLOW UP:  2 mo  Signed:  Crissie Sickles, MD           10/01/2016

## 2016-10-02 ENCOUNTER — Other Ambulatory Visit: Payer: Self-pay | Admitting: *Deleted

## 2016-10-02 NOTE — Patient Outreach (Addendum)
[facility]) Care Management  10/02/2016  Linda [name] August 16, 1946 [id number]   Received a notification pt in [place] RED with several issues on the last screening. RN contacted pt today and spoke with pt concerning the area of distress. Pt reports she is "alot better" with reduced swelling to her lower ankles, legs and feet. Other areas discussed related to pt being sad, hopeless or empty and lack of interest in doing things. Pt states she is better and declined all services or agencies to assist (counseling via [place] worker or a referral for a psychiatrist if further in-dept counseling is needed.  RN discussed mobile crises and offered contact  information however pt declined this indicated things are much better now. RN made pt aware if she is screen via EMMI that this [facility] would follow up and inquire further on her results (pt with understanding).    RN inquired on pt's HF and verified her weight has reduced from 174 lbs with symptoms on Monday and Tuesday with a provider's visit on yesterday. Pt states with the extra fluid medication her weight is 171 lbs with reduced symptoms. Pt now on her regular dosage of medication.   RN will follow up as scheduled for later this week to inquire on pt's ongoing management of health and continue to provider needed resources accordingly. No other inquires or request at this time.   [name], RN Care Management Coordinator [place] Office [phone]

## 2016-10-02 NOTE — Patient Outreach (Signed)
[name]) Care Management  10/01/2016  Linda [name] [birth year] [id number]   RN followed up with pt as discussed on yesterday to inquire further on her ongoing management of care. Pt reports she visited her provider today and alerted her provider that she has taken an extra fluid medication due to her weight of 174 lbs. Pt states she remains at 174 lbs but her symptoms are not has bad as yesterday. Pt indicated she would take another fluid medications to reduce her ongoing swelling for today. Based upon having her provider's permission to take extra fluid medication with any ongoing swelling or symptoms that may occur from HF this is a preventive measure for ED visit or possibly admission. Pt denies all other symptoms at this time. Pt states she does not feel as bad as she did on yesterday but remains very tired.  No other interventions reported from her office visit on today. RN again extended an offer for home visits (pt declined at this time). RN has requested to continues contact of inquires and offered to follow up on Friday this week. RN also discussed a Engineer, maintenance however hesitated at this time.  [name], RN Care Management Coordinator [place] Office [phone]

## 2016-10-03 LAB — URINE CULTURE

## 2016-10-04 ENCOUNTER — Other Ambulatory Visit: Payer: Self-pay | Admitting: *Deleted

## 2016-10-04 NOTE — Patient Outreach (Signed)
[facility]) Care Management  10/04/2016  Linda [name] [birth year] [id number]   EMMI RED FOLLOW UP  RN attempted outreach call to pt today to follow up weights related to HF monitoring however pt not available. RN able to leave HIPAA approved voice message requesting a call back to inquire further. Will rescheduled another call for next week to follow.   [name], RN Care Management Coordinator [place] Office [phone]

## 2016-10-07 ENCOUNTER — Other Ambulatory Visit: Payer: Self-pay | Admitting: *Deleted

## 2016-10-07 NOTE — Patient Outreach (Signed)
[facility]) Care Management  10/07/2016  SOVANNA LUQUETTE 10-11-1946 [id number]   RN attempted another outreach to this pt however remains unsuccessful. RN able to leave a HIPAA approved voice message requesting a cal back. Will rescheduled another a outreach and inquire further at that time.   [name], RN Care Management Coordinator [place] Office [phone]

## 2016-10-08 ENCOUNTER — Other Ambulatory Visit: Payer: Self-pay | Admitting: *Deleted

## 2016-10-08 NOTE — Patient Outreach (Signed)
[facility]) Care Management  10/08/2016  INARA ADAIR [birth year] [id number]   EMMI RED FOLLOW UP ALL  RN followed up with pt concerning her ongoing HF monitoring. RN inquired about her symptoms experienced via EMMI red. Pt reports all symptoms are improved in all areas and weight today 172 lbs, yesterday 176 lbs as pt took an extra fluid pill as pt's provider has recommended. RN reviewed all abnormal symptoms reported via EMMI RED as pt once again indicated symptoms are much better today then yesterday with no fevers or chills. Pt reports she is experiencing a lot of thrist but her symptoms have improved. Pt denies any history of diabetes and will discussed on upcoming appointment with her provider.  States she will follow up with Dr. Stanford Breed on 10/20 and Dr. Janey Greaser on 10/26. If symptoms get worse pt aware to contact her provider for additional interventions. RN once again extend the offer for community home visit however pt opt to declined but remains receptive to ongiong telephonic follow ups. RN also discussed possible health coach however pt hesitate but receptive to this RN following up later this week. Will re-evaluate at that time and discussed plan of care and goals today (pt receptive).   [name], RN Care Management Coordinator [place] Office [phone]

## 2016-10-09 DIAGNOSIS — M19011 Primary osteoarthritis, right shoulder: Secondary | ICD-10-CM | POA: Diagnosis not present

## 2016-10-09 DIAGNOSIS — M67811 Other specified disorders of synovium, right shoulder: Secondary | ICD-10-CM | POA: Diagnosis not present

## 2016-10-09 DIAGNOSIS — M25511 Pain in right shoulder: Secondary | ICD-10-CM | POA: Diagnosis not present

## 2016-10-11 ENCOUNTER — Encounter: Payer: Self-pay | Admitting: Physician Assistant

## 2016-10-11 ENCOUNTER — Ambulatory Visit (INDEPENDENT_AMBULATORY_CARE_PROVIDER_SITE_OTHER): Payer: Medicare Other | Admitting: Physician Assistant

## 2016-10-11 ENCOUNTER — Other Ambulatory Visit: Payer: Self-pay | Admitting: *Deleted

## 2016-10-11 VITALS — BP 113/70 | HR 77 | Ht 64.0 in | Wt 177.0 lb

## 2016-10-11 DIAGNOSIS — I5033 Acute on chronic diastolic (congestive) heart failure: Secondary | ICD-10-CM

## 2016-10-11 DIAGNOSIS — I2510 Atherosclerotic heart disease of native coronary artery without angina pectoris: Secondary | ICD-10-CM

## 2016-10-11 DIAGNOSIS — Z79899 Other long term (current) drug therapy: Secondary | ICD-10-CM

## 2016-10-11 DIAGNOSIS — I5032 Chronic diastolic (congestive) heart failure: Secondary | ICD-10-CM | POA: Diagnosis not present

## 2016-10-11 DIAGNOSIS — I2583 Coronary atherosclerosis due to lipid rich plaque: Secondary | ICD-10-CM

## 2016-10-11 LAB — BASIC METABOLIC PANEL
BUN: 10 mg/dL (ref 7–25)
CALCIUM: 8.9 mg/dL (ref 8.6–10.4)
CO2: 31 mmol/L (ref 20–31)
CREATININE: 0.96 mg/dL — AB (ref 0.60–0.93)
Chloride: 102 mmol/L (ref 98–110)
GLUCOSE: 81 mg/dL (ref 65–99)
Potassium: 4 mmol/L (ref 3.5–5.3)
Sodium: 143 mmol/L (ref 135–146)

## 2016-10-11 MED ORDER — FUROSEMIDE 40 MG PO TABS: 40.0000 mg | ORAL_TABLET | Freq: Every day | ORAL | 11 refills | Status: DC

## 2016-10-11 MED ORDER — POTASSIUM CHLORIDE CRYS ER 20 MEQ PO TBCR: 40.0000 meq | EXTENDED_RELEASE_TABLET | Freq: Every day | ORAL | 11 refills | Status: DC

## 2016-10-11 NOTE — Patient Instructions (Signed)
Medication Instructions: Rosaria Ferries, PA-C has recommended making the following medication changes: 1. INCREASE Furosemide to 80 mg ONCE DAILY for 3 days then go down to 40 mg ONCE DAILY   >>OKAY to take 2 tablets any day your weight is up 2. INCREASE Potassium - take 2 extra tablet ANY TIME YOU TAKE 80 MG OF FUROSEMIDE 3. OKAY to hold Isosorbide if you are feeling lightheaded or dizzy.  Labwork: Your physician recommends that you return for lab work in [place].  Your physician recommends that you return for lab work in 35 month. At least 2-3 days prior to your follow-up appointment.  Testing/Procedures: NONE ORDERED  Follow-up: Suanne Marker recommends that you schedule a follow-up appointment in 1 month.  If you need a refill on your cardiac medications before your next appointment, please call your pharmacy.

## 2016-10-11 NOTE — Progress Notes (Signed)
Cardiology Office Note   Date:  10/11/2016   ID:  Linda [name], DOB 1946/05/12, MRN [id number]  PCP:  Tammi Sou, MD  Cardiologist:  Dr Alcide Evener, PA-C   Chief Complaint  Patient presents with  . Hospitalization Follow-up    History of Present Illness: Linda [name] is a 70 y.o. female with a history of COPD on O2 3 lpm, HTN, ?orthostatic syncope, D-CHF, mod CAD by cath 2014 - med rx, fibromyalgia w/ chronic pain issues. RBBB and inf Q waves on ECG  Seen in ofc 09/28 and admitted for CHF, d/c 10/02, wt 176 lbs  [name] presents for Post hospital follow-up. Her son is with her today.  Ms. Fickle is tracking her weight in a binder. She has been taking Lasix 40 mg a day and taking an extra Lasix in the afternoon for the last 3 days. Right after discharge, her weight went down 271. Since then, it has been trending up and today it was 177. She is extremely sensitive to changes in her weight. When her weight goes up, she feels more short of breath and gets tired much more easily.  She is compliant with her other medications. She complains of thirst. She also will get lightheaded occasionally, but this is not consistent.  She is an independent living in a facility, she eats toast and coffee for breakfast with a little marmalade and eats only one other meal a day, in the dining room. She tries to make heart healthy choices, these are marked. None of the food is labeled low sodium. She does not add salt to anything, and uses Mrs. Dash when needed. She drinks 3-1/2 L bottles of water daily, and will also have a cup of coffee with breakfast, and ginger ale.   Past Medical History:  Diagnosis Date  . Acute upper GI bleed 06/2014   while pt taking coumadin, plavix, and meloxicam---despite being told not to take coumadin.  . Anginal pain ([place])    Nonobstructive CAD 2014; however, her cardiologist put her on a statin for this and NOT for  hyperlipidemia per pt report.  . Anxiety    panic attacks  . Asthma    w/ asbestososis   . BPPV (benign paroxysmal positional vertigo) 12/16/2012  . Chronic lower back pain   . COPD (chronic obstructive pulmonary disease) ([place])   . DDD (degenerative disc disease)    lumbar and cervical.   . Depression   . Diastolic congestive heart failure ([place])    zaroxolyn 2.5mg  q week prn dose added 06/27/16 by cardiology  . Diverticular disease   . Fibromyalgia    Patient states dx was around her late 64s but she had sx's for years prior to this.  . H/O hiatal hernia   . Hay fever   . History of pneumonia    hospitalized 12/2011, 02/2013, and 07/2013 [facility]) for this  . HTN (hypertension)   . Idiopathic angio-edema-urticaria 72014   Angioedema component was very minimal  . Insomnia   . Iron deficiency anemia    Hematologist in [place], [place] did extensive w/u; no cause found; failed oral supplement;; gets fairly regular (q79m or so) IV iron infusions (Venofer -iron sucrose- 200mg  with procrit.  "for 14 yr I've been getting blood work q month & getting infusions prn" (07/12/2013).  Dr. Marin Olp locally, iron infusions done, EPO deficiency dx'd  . Migraine syndrome    "not as often anymore; used to be ~  q wk" (07/12/2013)  . Mixed incontinence urge and stress   . Nephrolithiasis    "passed all on my own or they are still in there" (07/12/2013)  . OSA on CPAP    prior to move to [place]--had another sleep study 10/2015 w/pulm Dr. Camillo Flaming.  . Osteoarthritis    "severe; progressing fast" (07/12/2013); multiple joints-not surgical candidate for TKR (03/2015)  . Pernicious anemia 08/24/2014  . Pleural plaque with presence of asbestos 07/22/2013  . Pulmonary embolism ([place]) 07/2013   Dx at [facility] with very small peripheral upper lobe pe 07/2013: pt took coumadin for about 8-9 mo  . Pyelonephritis    "several times over the last 30 yr" (07/12/2013)  . RBBB (right bundle branch block)   . Recurrent UTI    hx of hospitalization  for pyelonephritis; started abx prophylaxis 06/2015  . Syncope    Hypotensive; ED visit--Dr. Terrence Dupont did Cath--nonobstructive CAD, EF 55-60%.  In retrospect, suspect pt rx med misuse/polypharmacy  . Tension headache, chronic     Past Surgical History:  Procedure Laterality Date  . APPENDECTOMY  1960  . AXILLARY SURGERY Left 1978   Multiple "lump" in armpit per pt  . CARDIAC CATHETERIZATION  01/2013   nonobstructive CAD, EF 55-60%  . CARDIOVASCULAR STRESS TEST  02/22/15   Low risk myocard perf imaging; wall motion normal, normal EF  . COCCYX REMOVAL  1972  . DILATION AND CURETTAGE OF UTERUS  ? 1970's  . ESOPHAGOGASTRODUODENOSCOPY N/A 07/19/2014   Gastritis found + in the setting of supratherapeutic INR, +plavix, + meloxicam.  . EYE SURGERY Left 2012-2013   "injections for ~ 1 yr; don't really know what for" (07/12/2013)  . HEEL SPUR SURGERY Left 2008  . KNEE SURGERY  2005  . LEFT HEART CATHETERIZATION WITH CORONARY ANGIOGRAM N/A 01/30/2013   Procedure: LEFT HEART CATHETERIZATION WITH CORONARY ANGIOGRAM;  Surgeon: Clent Demark, MD;  Location: [facility] CATH LAB;  Service: Cardiovascular;  Laterality: N/A;  . PLANTAR FASCIA RELEASE Left 2008  . SPIROMETRY  04/25/14   In hosp for acute asthma/COPD flare: mixed obstructive and restrictive lung disease. The FEV1 is severely reduced at 45% predicted.  FEV1 signif decreased compared to prior spirometry 07/23/13.  . TENDON RELEASE  1996   Right forearm and hand  . TOTAL ABDOMINAL HYSTERECTOMY  1974  . TRANSTHORACIC ECHOCARDIOGRAM  01/2013; 04/2014;08/2015   2014--NORMAL.  2015--focal basal septal hypertrophy, EF 55-60%, grade I diast dysfxn, mild LAE.  08/2015 EF 55-60%, nl LV syst fxn, grade I DD, valves wnl    Current Outpatient Prescriptions  Medication Sig Dispense Refill  . acetaminophen (TYLENOL) 325 MG tablet Take 2 tablets (650 mg total) by mouth every 4 (four) hours as needed for headache or mild pain.    [name] Kitchen albuterol (PROVENTIL HFA;VENTOLIN HFA)  108 (90 BASE) MCG/ACT inhaler Inhale 2 puffs into the lungs every 6 (six) hours as needed for wheezing or shortness of breath. 1 Inhaler 1  . ALPRAZolam (XANAX) 1 MG tablet Take 1 mg by mouth See admin instructions. Pt takes 1 tablet every morning and 1 tablet every night at bedtime, and may take a dose throughout the day as needed    . ARIPiprazole (ABILIFY) 10 MG tablet Take 10 mg by mouth daily.    [name] Kitchen aspirin 81 MG tablet Take 81 mg by mouth at bedtime.    [name] Kitchen atorvastatin (LIPITOR) 20 MG tablet Take 20 mg by mouth at bedtime.     . budesonide-formoterol (SYMBICORT) 160-4.5 MCG/ACT  inhaler Inhale 2 puffs into the lungs 2 (two) times daily. 1 Inhaler 12  . Cholecalciferol (VITAMIN D3) 50000 units CAPS Take 1 capsule by mouth every Monday.     . diclofenac sodium (VOLTAREN) 1 % GEL Apply 1 application topically 3 (three) times daily as needed (pain).     . DULoxetine (CYMBALTA) 30 MG capsule Take 30 mg by mouth at bedtime.     . DULoxetine (CYMBALTA) 60 MG capsule Take 60 mg by mouth at bedtime. TO BE COMBINED WITH 30 MG TAB TO EQUAL 90 MG     . folic acid (FOLVITE) 1 MG tablet Take 2 tablets (2 mg total) by mouth daily. 60 tablet 6  . furosemide (LASIX) 40 MG tablet Take 40 mg by mouth See admin instructions. Pt takes 1 tablet at 0900 every morning, and may Take 1 extra tablets for swelling in the afternoon at 1400.    [name] Kitchen isosorbide mononitrate (IMDUR) 30 MG 24 hr tablet Take 30 mg by mouth daily.    . metoprolol succinate (TOPROL XL) 25 MG 24 hr tablet Take 1 tablet (25 mg total) by mouth daily. 30 tablet 11  . Multiple Vitamin (MULTIVITAMIN WITH MINERALS) TABS tablet Take 1 tablet by mouth daily.    . nitroGLYCERIN (NITROSTAT) 0.4 MG SL tablet Place 0.4 mg under the tongue every 5 (five) minutes as needed for chest pain (x 3 doses).     . ondansetron (ZOFRAN-ODT) 4 MG disintegrating tablet Take 4 mg by mouth every 8 (eight) hours as needed for nausea or vomiting.    . Oxycodone HCl 10 MG TABS Take 1  tablet (10 mg total) by mouth every 6 (six) hours. 100 tablet 0  . OXYGEN Inhale 3 L into the lungs continuous.    . pantoprazole (PROTONIX) 40 MG tablet Take 40 mg by mouth daily.    . polyethylene glycol (MIRALAX / GLYCOLAX) packet Take 17 g by mouth daily as needed (constipation). Mix in 8 oz liquid and drink    . potassium chloride SA (K-DUR,KLOR-CON) 20 MEQ tablet Take 2 tablets (40 mEq total) by mouth daily. Take (40 meq) by mouth every morning. Take 1 tablet whenever you take extra tablet of Lasix.    . pregabalin (LYRICA) 300 MG capsule Take 300 mg by mouth at bedtime.     . traZODone (DESYREL) 50 MG tablet Take 100-200 mg by mouth See admin instructions. INSOMNIA. Patient may take 2, 3 or 4 tablets at bedtime .     No current facility-administered medications for this visit.    Facility-Administered Medications Ordered in Other Visits  Medication Dose Route Frequency Provider Last Rate Last Dose  . ferumoxytol (FERAHEME) 510 mg in sodium chloride 0.9 % 100 mL IVPB  510 mg Intravenous Once Volanda Napoleon, MD        Allergies:   Penicillins    Social History:  The patient  reports that she has never smoked. She has never used smokeless tobacco. She reports that she does not drink alcohol or use drugs.   Family History:  The patient's family history includes Arthritis in her mother; Diabetes in her father; Heart attack in her father and paternal grandmother; Heart disease in her father; Hypertension in her father; Kidney disease in her mother; Stroke in her father.    ROS:  Please see the history of present illness. All other systems are reviewed and negative.    PHYSICAL EXAM: VS:  BP 113/70   Pulse 77  Ht 5\' 4"  (1.626 m)   Wt 177 lb (80.3 kg)   SpO2 92%   BMI 30.38 kg/m  , BMI Body mass index is 30.38 kg/m. GEN: Well nourished, well developed, female in no acute distress  HEENT: normal for age  Neck: JVD 7-8 centimeters mildly positive hepatojugular reflux, no carotid  bruit, no masses Cardiac: RRR; soft murmur, no rubs, or gallops Respiratory:  Decreased breath sounds bases bilaterally, normal work of breathing GI: soft, nontender, nondistended, + BS MS: no deformity or atrophy; trace edema; distal pulses are 2+ in all 4 extremities   Skin: warm and dry, no rash Neuro:  Strength and sensation are intact Psych: euthymic mood, full affect   EKG:  EKG is ordered today. The ekg ordered today demonstrates sinus rhythm, right bundle branch block is old   Recent Labs: 04/08/2016: Magnesium 2.0; TSH 1.002 09/19/2016: ALT 15; B Natriuretic Peptide 14.1; Hemoglobin 12.8; Platelets 220 10/01/2016: BUN 11; Creatinine, Ser 0.88; Potassium 3.9; Sodium 142    Lipid Panel    Component Value Date/Time   CHOL 152 01/31/2013 0500   TRIG 92 01/31/2013 0500   HDL 44 01/31/2013 0500   CHOLHDL 3.5 01/31/2013 0500   VLDL 18 01/31/2013 0500   LDLCALC 90 01/31/2013 0500     Wt Readings from Last 3 Encounters:  10/11/16 177 lb (80.3 kg)  10/01/16 172 lb 12.8 oz (78.4 kg)  09/23/16 176 lb 3.2 oz (79.9 kg)     Other studies Reviewed: Additional studies/ records that were reviewed today include: Office notes, hospital records and testing.  ASSESSMENT AND PLAN:  1.  Acute on chronic diastolic CHF: The patient wishes to get IV Lasix, but I explained that if she is going to the hospital every week or 2 for IV Lasix, her living situation is not adequate. She likes being an independent living so we will try to make that work. We will increase her Lasix to 2-40 milligram tablets every morning for 3 days. She doesn't feel that the afternoon dose does very much good, so we will discontinue that.   Once her volume status is back to baseline, she is to go back to 40 mg daily, but increase it to 80 mg in a day when her weight is up, or her edema is worse. She was advised that she will likely have to take the 80 mg dose at least 3 or 4 days a week, possibly daily. I reassured she  and her son that we would follow her electrolytes and kidney function carefully, but they were normal the last time they were checked. I asked her to drink a little less water, but let her know that she has to drink at least a liter of water a day for her kidneys to work. A total of 2 L daily may be too much. I don't know how much sodium is in the food the facility serves, but she is not making high sodium choices. We will just increase her Lasix to maintain her weight. Every time she takes the extra Lasix, she must take 2 extra potassium tablet.  2. Being lightheaded: Anytime she feels lightheaded, it is okay to hold the isosorbide.   Current medicines are reviewed at length with the patient today.  The patient does not have concerns regarding medicines.  The following changes have been made:  Increase Lasix and potassium as draped in  Labs/ tests ordered today include:   Orders Placed This Encounter  Procedures  .  Basic metabolic panel  . Basic metabolic panel  . EKG 12-Lead     Disposition:   FU with Dr. Stanford Breed for me in a month with another BMET  Signed, Lenoard Aden  10/11/2016 12:47 PM    [place] Phone: [phone]; Fax: [phone]  This note was written with the assistance of speech recognition software. Please excuse any transcriptional errors.

## 2016-10-11 NOTE — Patient Outreach (Signed)
[place] [facility]) Care Management  10/11/2016  Linda [name] January 14, 1946 [id number]  Linda [name] 10/19 RN attempted outreach call to pt however unsuccessful. RN able to leave a HIPAA approved voice message requesting a call back. Will inquire further at that time on pt's symptoms and needs. Will reschedule call for next week.  [name], RN Care Management Coordinator [place] Office [phone]

## 2016-10-14 ENCOUNTER — Other Ambulatory Visit: Payer: Self-pay | Admitting: *Deleted

## 2016-10-14 NOTE — Patient Outreach (Addendum)
[place] [facility]) Care Management  10/14/2016  ANELIS WAVRA 1946/06/15 [id number]   Pt was with several EMMI REDS over the last week as RN has several outreach calls. Pt returned a call after hours over the weekend and left a voice message. RN returned that call this morning to inquire further on her ongoing HF symptoms however unsuccessful and only able to leave a HIPAA approved voice message once again requesting pt to return the call. Will inquire further at that time concerning her management of care concerning her HF symptoms. Will reschedule another follow up call of inquire this week.  [name], RN Care Management Coordinator [place] Office [phone]

## 2016-10-15 ENCOUNTER — Encounter: Payer: Self-pay | Admitting: Registered Nurse

## 2016-10-15 ENCOUNTER — Encounter: Payer: Medicare Other | Attending: Physical Medicine & Rehabilitation | Admitting: Registered Nurse

## 2016-10-15 ENCOUNTER — Other Ambulatory Visit: Payer: Medicare Other | Admitting: *Deleted

## 2016-10-15 VITALS — BP 97/66 | HR 87 | Resp 14

## 2016-10-15 DIAGNOSIS — M5416 Radiculopathy, lumbar region: Secondary | ICD-10-CM | POA: Diagnosis not present

## 2016-10-15 DIAGNOSIS — I2583 Coronary atherosclerosis due to lipid rich plaque: Secondary | ICD-10-CM | POA: Diagnosis not present

## 2016-10-15 DIAGNOSIS — Z5181 Encounter for therapeutic drug level monitoring: Secondary | ICD-10-CM | POA: Insufficient documentation

## 2016-10-15 DIAGNOSIS — M797 Fibromyalgia: Secondary | ICD-10-CM | POA: Insufficient documentation

## 2016-10-15 DIAGNOSIS — M1711 Unilateral primary osteoarthritis, right knee: Secondary | ICD-10-CM | POA: Diagnosis not present

## 2016-10-15 DIAGNOSIS — I2510 Atherosclerotic heart disease of native coronary artery without angina pectoris: Secondary | ICD-10-CM

## 2016-10-15 DIAGNOSIS — M75101 Unspecified rotator cuff tear or rupture of right shoulder, not specified as traumatic: Secondary | ICD-10-CM

## 2016-10-15 DIAGNOSIS — Z79899 Other long term (current) drug therapy: Secondary | ICD-10-CM | POA: Insufficient documentation

## 2016-10-15 DIAGNOSIS — R2689 Other abnormalities of gait and mobility: Secondary | ICD-10-CM | POA: Diagnosis not present

## 2016-10-15 DIAGNOSIS — G894 Chronic pain syndrome: Secondary | ICD-10-CM

## 2016-10-15 DIAGNOSIS — M1712 Unilateral primary osteoarthritis, left knee: Secondary | ICD-10-CM

## 2016-10-15 MED ORDER — OXYCODONE HCL 10 MG PO TABS: 10.0000 mg | ORAL_TABLET | Freq: Four times a day (QID) | ORAL | 0 refills | Status: DC

## 2016-10-15 NOTE — Addendum Note (Signed)
Addended by: Elinor Parkinson I on: 10/15/2016 03:02 PM   Modules accepted: Orders

## 2016-10-15 NOTE — Patient Outreach (Signed)
[place]) Care Management  10/15/2016  Linda [name] Mar 30, 1946 [id number]   RN attempted another outreach call today however pt not available and RN left a HIPAA approved voice message requesting a call back. Will inquire further on the recent EMMI red reports concerning pt's HF. Pt scheduled for another follow up call tomorrow if unsuccessful will mail outreach letter.   [name], RN Care Management Coordinator [place] Office [phone]

## 2016-10-15 NOTE — Progress Notes (Signed)
Subjective:    Patient ID: Linda [name], female    DOB: Apr 05, 1946, 70 y.o.   MRN: [id number]  HPI: [name] is a 70 year old female who returns for follow up appointmentfor chronic pain and medication refill. She states her pain is located in herlower back radiating into her right hip and lower extremities laterally and posteriorly and bilateral knee pain. She rates her pain 7. Her current exercise regime is performing stretching exercises, stationary peddle bicycle for upper and lower extremities  ( upper for 10 minutes and lower for 5 minutes) every other day, also using the Nu-Step three days a week and walking.  Pain Inventory Average Pain 6 Pain Right Now 7 My pain is sharp and aching  In the last 24 hours, has pain interfered with the following? General activity 10 Relation with others 10 Enjoyment of life 10 What TIME of day is your pain at its worst? daytime Sleep (in general) Fair  Pain is worse with: walking, bending, standing and some activites Pain improves with: rest and medication Relief from Meds: 7  Mobility use a walker how many minutes can you walk? 5 Do you have any goals in this area?  yes  Function retired  Neuro/Psych No problems in this area  Prior Studies Any changes since last visit?  no  Physicians involved in your care Any changes since last visit?  no   Family History  Problem Relation Age of Onset  . Arthritis Mother   . Kidney disease Mother   . Heart disease Father   . Stroke Father   . Hypertension Father   . Diabetes Father   . Heart attack Father   . Heart attack Paternal Grandmother    Social History   Social History  . Marital status: Widowed    Spouse name: N/A  . Number of children: 2  . Years of education: N/A   Occupational History  . Retired    Social History Main Topics  . Smoking status: Never Smoker  . Smokeless tobacco: Never Used     Comment: never used tobacco  . Alcohol use No    . Drug use: No  . Sexual activity: Not Currently   Other Topics Concern  . None   Social History Narrative   Widowed, 2 sons.  Relocated to [place] 09/2012 to be closer to her son who has MS.   Husband d 2015--mesothelioma.   Occupation: former Pharmacist, hospital.   Education: masters degree level.   No T/A/Ds.   Lives in [place], independent living.   Past Surgical History:  Procedure Laterality Date  . APPENDECTOMY  1960  . AXILLARY SURGERY Left 1978   Multiple "lump" in armpit per pt  . CARDIAC CATHETERIZATION  01/2013   nonobstructive CAD, EF 55-60%  . CARDIOVASCULAR STRESS TEST  02/22/15   Low risk myocard perf imaging; wall motion normal, normal EF  . COCCYX REMOVAL  1972  . DILATION AND CURETTAGE OF UTERUS  ? 1970's  . ESOPHAGOGASTRODUODENOSCOPY N/A 07/19/2014   Gastritis found + in the setting of supratherapeutic INR, +plavix, + meloxicam.  . EYE SURGERY Left 2012-2013   "injections for ~ 1 yr; don't really know what for" (07/12/2013)  . HEEL SPUR SURGERY Left 2008  . KNEE SURGERY  2005  . LEFT HEART CATHETERIZATION WITH CORONARY ANGIOGRAM N/A 01/30/2013   Procedure: LEFT HEART CATHETERIZATION WITH CORONARY ANGIOGRAM;  Surgeon: Clent Demark, MD;  Location: [facility] CATH LAB;  Service:  Cardiovascular;  Laterality: N/A;  . PLANTAR FASCIA RELEASE Left 2008  . SPIROMETRY  04/25/14   In hosp for acute asthma/COPD flare: mixed obstructive and restrictive lung disease. The FEV1 is severely reduced at 45% predicted.  FEV1 signif decreased compared to prior spirometry 07/23/13.  . TENDON RELEASE  1996   Right forearm and hand  . TOTAL ABDOMINAL HYSTERECTOMY  1974  . TRANSTHORACIC ECHOCARDIOGRAM  01/2013; 04/2014;08/2015   2014--NORMAL.  2015--focal basal septal hypertrophy, EF 55-60%, grade I diast dysfxn, mild LAE.  08/2015 EF 55-60%, nl LV syst fxn, grade I DD, valves wnl   Past Medical History:  Diagnosis Date  . Acute upper GI bleed 06/2014   while pt taking coumadin, plavix, and  meloxicam---despite being told not to take coumadin.  . Anginal pain ([place])    Nonobstructive CAD 2014; however, her cardiologist put her on a statin for this and NOT for hyperlipidemia per pt report.  . Anxiety    panic attacks  . Asthma    w/ asbestososis   . BPPV (benign paroxysmal positional vertigo) 12/16/2012  . Chronic lower back pain   . COPD (chronic obstructive pulmonary disease) ([place])   . DDD (degenerative disc disease)    lumbar and cervical.   . Depression   . Diastolic congestive heart failure ([place])    zaroxolyn 2.5mg  q week prn dose added 06/27/16 by cardiology  . Diverticular disease   . Fibromyalgia    Patient states dx was around her late 15s but she had sx's for years prior to this.  . H/O hiatal hernia   . Hay fever   . History of pneumonia    hospitalized 12/2011, 02/2013, and 07/2013 [facility]) for this  . HTN (hypertension)   . Idiopathic angio-edema-urticaria 72014   Angioedema component was very minimal  . Insomnia   . Iron deficiency anemia    [place] in [place] did [place] w/u; no cause found; failed oral supplement;; gets fairly regular (q37m or so) IV iron infusions (Venofer -iron sucrose- 200mg  with procrit.  "for 14 yr I've been getting blood work q month & getting infusions prn" (07/12/2013).  Dr. Marin Olp locally, iron infusions done, EPO deficiency dx'd  . Migraine syndrome    "not as often anymore; used to be ~ q wk" (07/12/2013)  . Mixed incontinence urge and stress   . Nephrolithiasis    "passed all on my own or they are still in there" (07/12/2013)  . OSA on CPAP    prior to move to [place]--had another sleep study 10/2015 w/pulm Dr. Camillo Flaming.  . Osteoarthritis    "severe; progressing fast" (07/12/2013); multiple joints-not surgical candidate for TKR (03/2015)  . Pernicious anemia 08/24/2014  . Pleural plaque with presence of asbestos 07/22/2013  . Pulmonary embolism ([place]) 07/2013   Dx at [facility] with very small peripheral upper lobe pe 07/2013: pt took  coumadin for about 8-9 mo  . Pyelonephritis    "several times over the last 30 yr" (07/12/2013)  . RBBB (right bundle branch block)   . Recurrent UTI    hx of hospitalization for pyelonephritis; started abx prophylaxis 06/2015  . Syncope    Hypotensive; ED visit--Dr. Terrence Dupont did Cath--nonobstructive CAD, EF 55-60%.  In retrospect, suspect pt rx med misuse/polypharmacy  . Tension headache, chronic    BP 97/66   Pulse 87   Resp 14   SpO2 93%   Opioid Risk Score:   Fall Risk Score:  `1  Depression screen [facility] 2/9  Depression screen [facility] 2/9 08/01/2016 06/12/2016 04/19/2016 08/11/2015 07/12/2015 03/27/2015  Decreased Interest 3 1 0 3 0 3  Down, Depressed, Hopeless 3 0 0 3 1 3   PHQ - 2 Score 6 1 0 6 1 6   Altered sleeping - - - 2 - 2  Tired, decreased energy - - - 3 - 3  Change in appetite - - - 0 - 2  Feeling bad or failure about yourself  - - - 2 - 2  Trouble concentrating - - - 0 - 2  Moving slowly or fidgety/restless - - - 0 - 0  Suicidal thoughts - - - 0 - 0  PHQ-9 Score - - - 13 - 17  Some recent data might be hidden     Review of Systems  All other systems reviewed and are negative.      Objective:   Physical Exam  Constitutional: She is oriented to person, place, and time. She appears well-developed and well-nourished.  HENT:  Head: Normocephalic and atraumatic.  Neck: Normal range of motion. Neck supple.  Cardiovascular: Normal rate and regular rhythm.   Pulmonary/Chest: Effort normal and breath sounds normal.  Musculoskeletal:  Normal Muscle Bulk and Muscle Testing Reveals: Upper Extremities: Full ROM and Muscle Strength 5/5 Lumbar Paraspinal Tenderness: L-3- L-5 Lower Extremities: Full ROM and Muscle Strength 5/5 Bilateral Lower Extremities Flexion Produces Pain into Bilateral Patella's Arises from chair slowly using walker for support Antalgic Gait  Neurological: She is alert and oriented to person, place, and time.  Skin: Skin is warm and dry.  Psychiatric: She  has a normal mood and affect.  Nursing note and vitals reviewed.         Assessment & Plan:  1. Functional deficits secondary to Gait disorder: Continue with HEP. 2. Chronic Back pain/ Lumbar Radiculitis/fibromyalgia/R>L Knee OA Pain Management:  Refilled: Oxycodone 10 mgone tablet every 6 hours #100/ Continue Lyrica and Voltaren Gel We will continue the opioid monitoring program, this consists of regular clinic visits, examinations, urine drug screen, pill counts as well as use of [place] Controlled Substance reporting System. 3. Depression with anxiety/Grief reaction/Mood: Continue Trazodone and Cymbalta .  4. [name] with asthma: Oxygen dependent. Albuterol prn.  On Continuous Oxygen Therapy: Wearing 3 liters [place] :Pulmonology Following. 5. Bilateral Osteoarthritis of Bilateral Knees: Dr. Juanda Bond Following. 6. Fibromyalgia: Continue Lyrica andexercise regime.  20 minutes of face to face patient care time was spent during this visit. All questions were encouraged and answered.

## 2016-10-16 ENCOUNTER — Encounter: Payer: Self-pay | Admitting: *Deleted

## 2016-10-16 ENCOUNTER — Other Ambulatory Visit: Payer: Self-pay | Admitting: *Deleted

## 2016-10-16 NOTE — Patient Outreach (Signed)
[facility]) Care Management  10/16/2016  Linda [name] 13-May-1946 [id number]   RN attempted to reach pt once again today due to [facility] RED however unsuccessful. RN able to leave a HIPAA approved voice message requesting a call back. Will further intervene at that time. Will mail an outreach letter to pt based upon the unsuccessful attempts to reach this pt and allow 10 days to response prior to closure of this case. Note the following recent Emmi weights related to her HF calls:  Saturday 178 lbs Sunday   174 lbs Tuesday 171 lbs  No other symptoms presented on the report  [name], RN Care Management Coordinator [place] Office [phone]

## 2016-10-17 ENCOUNTER — Other Ambulatory Visit: Payer: Self-pay | Admitting: *Deleted

## 2016-10-17 ENCOUNTER — Encounter: Payer: Self-pay | Admitting: *Deleted

## 2016-10-17 ENCOUNTER — Other Ambulatory Visit: Payer: Self-pay | Admitting: Family

## 2016-10-17 ENCOUNTER — Other Ambulatory Visit: Payer: Self-pay | Admitting: Family Medicine

## 2016-10-17 DIAGNOSIS — R7989 Other specified abnormal findings of blood chemistry: Secondary | ICD-10-CM | POA: Insufficient documentation

## 2016-10-17 NOTE — Telephone Encounter (Signed)
Please advise RF's on patient's xanax, abilify, and cymbalta.  I don't see that you Rx'd these before.

## 2016-10-17 NOTE — Patient Outreach (Signed)
[facility]) Care Management  10/17/2016  TAKERRA QUINTILIANI [birth year] [id number]  EMMI RED  RN outreached to pt today and reintroduced the [facility] program and purpose of today's call. Explained the EMMI reports and inquired further on pt's results. Pt corrected the report and indicated her weight was 175 lbs with 3.5 lbs gained overnight. Pt states she had an office visit with her cardiologist on Friday and they are aware of her regimen with weight gained. Pt states when she has fluid retention she takes extra lasix of 40mg  for three days until resolved. States today no swelling  Or discomfort with no heaviness or pain but some increased SOB but tolerable. RN strongly encouraged pt to keep close observation of her weight gain and seek medical attending if her symptoms get worse (pt with understanding).  RN offered once again to completed a home visit for further involvement but pt has opt to decline. Also offered a health coach for ongoing telephonic disease management services but pt has also declined this opportunity for services. Pt is aware that she will be discharged completely from the [facility] services based upon her decline for ongoing services. Pt is aware of the risk involved if she does not continue to monitor her ongoing medical issues. No additional inquires or request at this time as RN will close this case and notify pt's primary provide.  Case will be closed.  [name], RN Care Management Coordinator [place] Office [phone]

## 2016-10-18 ENCOUNTER — Other Ambulatory Visit: Payer: Self-pay | Admitting: Family Medicine

## 2016-10-21 DIAGNOSIS — M25562 Pain in left knee: Secondary | ICD-10-CM | POA: Diagnosis not present

## 2016-10-21 DIAGNOSIS — M25561 Pain in right knee: Secondary | ICD-10-CM | POA: Diagnosis not present

## 2016-10-21 DIAGNOSIS — M170 Bilateral primary osteoarthritis of knee: Secondary | ICD-10-CM | POA: Diagnosis not present

## 2016-10-21 LAB — TOXASSURE SELECT,+ANTIDEPR,UR

## 2016-10-22 NOTE — Progress Notes (Signed)
This encounter was created in error - please disregard.

## 2016-10-23 DIAGNOSIS — M19011 Primary osteoarthritis, right shoulder: Secondary | ICD-10-CM | POA: Diagnosis not present

## 2016-10-23 DIAGNOSIS — M25561 Pain in right knee: Secondary | ICD-10-CM | POA: Diagnosis not present

## 2016-10-23 DIAGNOSIS — M25511 Pain in right shoulder: Secondary | ICD-10-CM | POA: Diagnosis not present

## 2016-10-23 DIAGNOSIS — M25562 Pain in left knee: Secondary | ICD-10-CM | POA: Diagnosis not present

## 2016-10-23 NOTE — Progress Notes (Signed)
Urine drug screen for this encounter is consistent for prescribed medication 

## 2016-10-27 ENCOUNTER — Other Ambulatory Visit: Payer: Self-pay | Admitting: Hematology & Oncology

## 2016-10-28 ENCOUNTER — Ambulatory Visit: Payer: Medicare Other | Admitting: Physician Assistant

## 2016-10-29 ENCOUNTER — Other Ambulatory Visit: Payer: Self-pay | Admitting: Hematology & Oncology

## 2016-10-31 ENCOUNTER — Other Ambulatory Visit: Payer: Self-pay | Admitting: Hematology & Oncology

## 2016-10-31 DIAGNOSIS — M19011 Primary osteoarthritis, right shoulder: Secondary | ICD-10-CM | POA: Diagnosis not present

## 2016-10-31 DIAGNOSIS — G8929 Other chronic pain: Secondary | ICD-10-CM | POA: Diagnosis not present

## 2016-10-31 DIAGNOSIS — M25511 Pain in right shoulder: Secondary | ICD-10-CM | POA: Diagnosis not present

## 2016-11-01 ENCOUNTER — Other Ambulatory Visit: Payer: Self-pay | Admitting: Hematology & Oncology

## 2016-11-07 ENCOUNTER — Encounter: Payer: Self-pay | Admitting: Physician Assistant

## 2016-11-07 ENCOUNTER — Ambulatory Visit (INDEPENDENT_AMBULATORY_CARE_PROVIDER_SITE_OTHER): Payer: Medicare Other | Admitting: Physician Assistant

## 2016-11-07 VITALS — BP 160/90 | HR 100 | Ht 64.0 in | Wt 170.4 lb

## 2016-11-07 DIAGNOSIS — I5032 Chronic diastolic (congestive) heart failure: Secondary | ICD-10-CM | POA: Diagnosis not present

## 2016-11-07 DIAGNOSIS — Z79899 Other long term (current) drug therapy: Secondary | ICD-10-CM

## 2016-11-07 DIAGNOSIS — E876 Hypokalemia: Secondary | ICD-10-CM

## 2016-11-07 DIAGNOSIS — I10 Essential (primary) hypertension: Secondary | ICD-10-CM | POA: Diagnosis not present

## 2016-11-07 DIAGNOSIS — I2510 Atherosclerotic heart disease of native coronary artery without angina pectoris: Secondary | ICD-10-CM

## 2016-11-07 DIAGNOSIS — I2583 Coronary atherosclerosis due to lipid rich plaque: Secondary | ICD-10-CM

## 2016-11-07 LAB — BASIC METABOLIC PANEL
BUN: 12 mg/dL (ref 7–25)
CHLORIDE: 102 mmol/L (ref 98–110)
CO2: 28 mmol/L (ref 20–31)
CREATININE: 0.79 mg/dL (ref 0.60–0.93)
Calcium: 9.4 mg/dL (ref 8.6–10.4)
Glucose, Bld: 92 mg/dL (ref 65–99)
POTASSIUM: 3.4 mmol/L — AB (ref 3.5–5.3)
Sodium: 142 mmol/L (ref 135–146)

## 2016-11-07 MED ORDER — METOPROLOL SUCCINATE ER 25 MG PO TB24: 25.0000 mg | ORAL_TABLET | Freq: Two times a day (BID) | ORAL | 11 refills | Status: DC

## 2016-11-07 NOTE — Patient Instructions (Addendum)
Medication Instructions:  INCREASE- Metoprolol Succ 25 mg twice a day Keep adjusting Lasix dose base on your weight  Labwork: BMP  Testing/Procedures: None Ordered  Follow-Up: Your physician recommends that you schedule a follow-up appointment in: 1 Month with Rosaria Ferries and 3 Months with Dr Stanford Breed   Any Other Special Instructions Will Be Listed Below (If Applicable).     If you need a refill on your cardiac medications before your next appointment, please call your pharmacy.

## 2016-11-07 NOTE — Progress Notes (Signed)
Cardiology Office Note   Date:  11/07/2016   ID:  Linda [name], DOB [birth year], MRN [id number]  PCP:  Tammi Sou, MD  Cardiologist:  Dr Dineen Kid, Suanne Marker, PA-C   CC: F/U of CHF  History of Present Illness: Linda [name] is a 70 y.o. female with a history of  COPD on O2 3 lpm, HTN, ?orthostatic syncope, D-CHF, mod CAD by cath 2014 -med rx, fibromyalgia w/ chronic pain issues. RBBB and inf Q waves on ECG  10/02, d/c after admit for CHF, d/c weight 176 lbs 10/20, office visit, wt 177 lbs w/ home weights trending up about 5 lbs. Pt feels IV Lasix works better but u/a to give that in the office, pt encouraged to do extra 40 mg Lasix qd x 3 days when her weight goes up.   Linda [name] presents for 1 month followup  She has continued to track her weight daily. She brings her logbook in today. She has taken the extra Lasix several times. In her logbook, her peak weight is 179 lbs. With careful attention to diet and extra Lasix, her weight today is 168 lbs on her home scale, 170.4 on ours.   With the decrease in her weight, her respiratory status has improved. She is getting out more, struggling less getting to appointments and moving around. She has no LE edema. She denies orthopnea or PND. She has not been particularly dizzy or lightheaded. She is drinking 2 half-liter bottles of water daily, along with some ginger ale and her morning coffee.  For the last 2 days, she has not taken Lasix due to appointments. However, her weight has not increased.   Her oxygen needs are at baseline.   She had nerve block injections in both knees, hopes it will help the pain. Otherwise, no recent procedures or changes in her condition.   Past Medical History:  Diagnosis Date  . Acute upper GI bleed 06/2014   while pt taking coumadin, plavix, and meloxicam---despite being told not to take coumadin.  . Anginal pain ([place])    Nonobstructive CAD 2014; however, her  cardiologist put her on a statin for this and NOT for hyperlipidemia per pt report.  . Anxiety    panic attacks  . Asthma    w/ asbestososis   . BPPV (benign paroxysmal positional vertigo) 12/16/2012  . Chronic lower back pain   . COPD (chronic obstructive pulmonary disease) ([place])   . DDD (degenerative disc disease)    lumbar and cervical.   . Depression   . Diastolic congestive heart failure ([place])    zaroxolyn 2.5mg  q week prn dose added 06/27/16 by cardiology  . Diverticular disease   . Fibromyalgia    Patient states dx was around her late 70s but she had sx's for years prior to this.  . H/O hiatal hernia   . Hay fever   . History of pneumonia    hospitalized 12/2011, 02/2013, and 07/2013 [facility]) for this  . HTN (hypertension)   . Idiopathic angio-edema-urticaria 72014   Angioedema component was very minimal  . Insomnia   . Iron deficiency anemia    Hematologist in [place], [place] did extensive w/u; no cause found; failed oral supplement;; gets fairly regular (q62m or so) IV iron infusions (Venofer -iron sucrose- 200mg  with procrit.  "for 14 yr I've been getting blood work q month & getting infusions prn" (07/12/2013).  Dr. Marin Olp locally, iron infusions done, EPO deficiency dx'd  . Migraine  syndrome    "not as often anymore; used to be ~ q wk" (07/12/2013)  . Mixed incontinence urge and stress   . Nephrolithiasis    "passed all on my own or they are still in there" (07/12/2013)  . OSA on CPAP    prior to move to [place]--had another sleep study 10/2015 w/pulm Dr. Camillo Flaming.  . Osteoarthritis    "severe; progressing fast" (07/12/2013); multiple joints-not surgical candidate for TKR (03/2015)  . Pernicious anemia 08/24/2014  . Pleural plaque with presence of asbestos 07/22/2013  . Pulmonary embolism ([place]) 07/2013   Dx at [facility] with very small peripheral upper lobe pe 07/2013: pt took coumadin for about 8-9 mo  . Pyelonephritis    "several times over the last 30 yr" (07/12/2013)  . RBBB (right bundle  branch block)   . Recurrent UTI    hx of hospitalization for pyelonephritis; started abx prophylaxis 06/2015  . Syncope    Hypotensive; ED visit--Dr. Terrence Dupont did Cath--nonobstructive CAD, EF 55-60%.  In retrospect, suspect pt rx med misuse/polypharmacy  . Tension headache, chronic     Past Surgical History:  Procedure Laterality Date  . APPENDECTOMY  1960  . AXILLARY SURGERY Left 1978   Multiple "lump" in armpit per pt  . CARDIAC CATHETERIZATION  01/2013   nonobstructive CAD, EF 55-60%  . CARDIOVASCULAR STRESS TEST  02/22/15   Low risk myocard perf imaging; wall motion normal, normal EF  . COCCYX REMOVAL  1972  . DILATION AND CURETTAGE OF UTERUS  ? 1970's  . ESOPHAGOGASTRODUODENOSCOPY N/A 07/19/2014   Gastritis found + in the setting of supratherapeutic INR, +plavix, + meloxicam.  . EYE SURGERY Left 2012-2013   "injections for ~ 1 yr; don't really know what for" (07/12/2013)  . HEEL SPUR SURGERY Left 2008  . KNEE SURGERY  2005  . LEFT HEART CATHETERIZATION WITH CORONARY ANGIOGRAM N/A 01/30/2013   Procedure: LEFT HEART CATHETERIZATION WITH CORONARY ANGIOGRAM;  Surgeon: Clent Demark, MD;  Location[facility] C[facility] LAB;  Service: Cardiovascular;  Laterality: N/A;  . PLANTAR FASCIA RELEASE Left 2008  . SPIROMETRY  04/25/14   In hosp for acute asthma/COPD flare: mixed obstructive and restrictive lung disease. The FEV1 is severely reduced at 45% predicted.  FEV1 signif decreased compared to prior spirometry 07/23/13.  . TENDON RELEASE  1996   Right forearm and hand  . TOTAL ABDOMINAL HYSTERECTOMY  1974  . TRANSTHORACIC ECHOCARDIOGRAM  01/2013; 04/2014;08/2015   2014--NORMAL.  2015--focal basal septal hypertrophy, EF 55-60%, grade I diast dysfxn, mild LAE.  08/2015 EF 55-60%, nl LV syst fxn, grade I DD, valves wnl    Current Outpatient Prescriptions  Medication Sig Dispense Refill  . acetaminophen (TYLENOL) 325 MG tablet Take 2 tablets (650 mg total) by mouth every 4 (four) hours as needed for headache or  mild pain.    [name] Kitchen albuterol (PROVENTIL HFA;VENTOLIN HFA) 108 (90 BASE) MCG/ACT inhaler Inhale 2 puffs into the lungs every 6 (six) hours as needed for wheezing or shortness of breath. 1 Inhaler 1  . ALPRAZolam (XANAX) 1 MG tablet TAKE 1 TABLET THREE TIMES DAILY AS NEEDED FOR ANXIETY. 90 tablet 5  . ARIPiprazole (ABILIFY) 10 MG tablet TAKE 1 TABLET ONCE DAILY. 90 tablet 3  . aspirin 81 MG tablet Take 81 mg by mouth at bedtime.    [name] Kitchen atorvastatin (LIPITOR) 20 MG tablet Take 20 mg by mouth at bedtime.     . budesonide-formoterol (SYMBICORT) 160-4.5 MCG/ACT inhaler Inhale 2 puffs into the lungs 2 (  two) times daily. 1 Inhaler 12  . Cholecalciferol (VITAMIN D3) 50000 units CAPS Take 1 capsule by mouth every Monday.     . diclofenac sodium (VOLTAREN) 1 % GEL Apply 1 application topically 3 (three) times daily as needed (pain).     . DULoxetine (CYMBALTA) 30 MG capsule TAKE 1 CAPSULE ONCE DAILY ALONG WITH 60MG  CAPSULE FOR TOTAL 90MG  DAILY. 90 capsule 3  . DULoxetine (CYMBALTA) 60 MG capsule Take 60 mg by mouth at bedtime. TO BE COMBINED WITH 30 MG TAB TO EQUAL 90 MG     . folic acid (FOLVITE) 1 MG tablet Take 2 tablets (2 mg total) by mouth daily. 60 tablet 6  . furosemide (LASIX) 40 MG tablet Take 1-2 tablets (40-80 mg total) by mouth daily. 60 tablet 11  . isosorbide mononitrate (IMDUR) 30 MG 24 hr tablet Take 30 mg by mouth daily.    [name] Kitchen LYRICA 300 MG capsule TAKE (1) CAPSULE TWICE DAILY. 60 capsule 0  . metoprolol succinate (TOPROL XL) 25 MG 24 hr tablet Take 1 tablet (25 mg total) by mouth daily. 30 tablet 11  . Multiple Vitamin (MULTIVITAMIN WITH MINERALS) TABS tablet Take 1 tablet by mouth daily.    . nitroGLYCERIN (NITROSTAT) 0.4 MG SL tablet Place 0.4 mg under the tongue every 5 (five) minutes as needed for chest pain (x 3 doses).     . ondansetron (ZOFRAN-ODT) 4 MG disintegrating tablet Take 4 mg by mouth every 8 (eight) hours as needed for nausea or vomiting.    . Oxycodone HCl 10 MG TABS Take 1  tablet (10 mg total) by mouth every 6 (six) hours. 100 tablet 0  . OXYGEN Inhale 3 L into the lungs continuous.    . pantoprazole (PROTONIX) 40 MG tablet Take 40 mg by mouth daily.    . polyethylene glycol (MIRALAX / GLYCOLAX) packet Take 17 g by mouth daily as needed (constipation). Mix in 8 oz liquid and drink    . potassium chloride SA (K-DUR,KLOR-CON) 20 MEQ tablet Take 2-4 tablets (40-80 mEq total) by mouth daily. 120 tablet 11  . traZODone (DESYREL) 50 MG tablet TAKE 2-4 TABLETS AT BEDTIME 120 tablet 6   No current facility-administered medications for this visit.    Facility-Administered Medications Ordered in Other Visits  Medication Dose Route Frequency Provider Last Rate Last Dose  . ferumoxytol (FERAHEME) 510 mg in sodium chloride 0.9 % 100 mL IVPB  510 mg Intravenous Once Volanda Napoleon, MD        Allergies:   Penicillins    Social History:  The patient  reports that she has never smoked. She has never used smokeless tobacco. She reports that she does not drink alcohol or use drugs.   Family History:  The patient's family history includes Arthritis in her mother; Diabetes in her father; Heart attack in her father and paternal grandmother; Heart disease in her father; Hypertension in her father; Kidney disease in her mother; Stroke in her father.    ROS:  Please see the history of present illness. All other systems are reviewed and negative.    PHYSICAL EXAM: VS:  BP (!) 160/90   Pulse 100   Ht 5\' 4"  (1.626 m)   Wt 170 lb 6.4 oz (77.3 kg)   BMI 29.25 kg/m  , BMI Body mass index is 29.25 kg/m. GEN: Well nourished, well developed, female in no acute distress  HEENT: normal for age. R eye is a little red, she has chronic dry  eye problem Neck: minimal JVD, no sig HJR, no carotid bruit, no masses Cardiac: RRR; soft murmur, no rubs, or gallops Respiratory:  clear to auscultation bilaterally, normal work of breathing GI: soft, nontender, nondistended, + BS Linda: no deformity  or atrophy; no edema; distal pulses are 2+ in all 4 extremities   Skin: warm and dry, no rash Neuro:  Strength and sensation are intact Psych: euthymic mood, full affect   EKG:  EKG is not ordered today.  ECHO: 05/2016 - Left ventricle: The cavity size was normal. Systolic function was   normal. The estimated ejection fraction was in the range of 60%   to 65%. Wall motion was normal; there were no regional wall   motion abnormalities. Doppler parameters are consistent with   abnormal left ventricular relaxation (grade 1 diastolic   dysfunction). - Mitral valve: Calcified annulus. Impressions: - Compared to the prior study, there has been no significant   interval change.  Recent Labs: 04/08/2016: Magnesium 2.0; TSH 1.002 09/19/2016: ALT 15; B Natriuretic Peptide 14.1; Hemoglobin 12.8; Platelets 220 10/11/2016: BUN 10; Creat 0.96; Potassium 4.0; Sodium 143    Lipid Panel    Component Value Date/Time   CHOL 152 01/31/2013 0500   TRIG 92 01/31/2013 0500   HDL 44 01/31/2013 0500   CHOLHDL 3.5 01/31/2013 0500   VLDL 18 01/31/2013 0500   LDLCALC 90 01/31/2013 0500     Wt Readings from Last 3 Encounters:  11/07/16 170 lb 6.4 oz (77.3 kg)  10/11/16 177 lb (80.3 kg)  10/01/16 172 lb 12.8 oz (78.4 kg)     Other studies Reviewed: Additional studies/ records that were reviewed today include: office notes and testing.  ASSESSMENT AND PLAN:  1.  Chronic diastolic CHF: Linda [name] was congratulated on her success at managing her volume status. She is drinking adequate fluids and watching the sodium. She is getting more comfortable taking extra Lasix as needed.   Discussed that her dry weight is now 168 lbs on her home scales. She should try to stay within 3 lbs of that. We will ck a BMET today to follow her renal function and electrolytes. Follow up in 1 month with me, ok to cancel that appt if she is doing well. Warned her that T-day tends to involve high sodium foods, she will have  to be careful.   2. HTN: with less Lasix, her BP may run higher consistently. Will increase Toprol XL to 25 mg bid and see how tolerated.   3. Hypokalemia: she is on Kdur, ck BMET today to make sure she is taking enough, not too much   Current medicines are reviewed at length with the patient today.  The patient does not have concerns regarding medicines.  The following changes have been made:  Increase metoprolol  Labs/ tests ordered today include:   Orders Placed This Encounter  Procedures  . Basic Metabolic Panel (BMET)    Disposition:   FU with myself and Dr Stanford Breed  Signed, Rosaria Ferries, PA-C  11/07/2016 3:16 PM    [facility] Phone: [phone]; Fax: [phone]  This note was written with the assistance of speech recognition software. Please excuse any transcriptional errors.

## 2016-11-10 ENCOUNTER — Encounter: Payer: Self-pay | Admitting: Family Medicine

## 2016-11-19 ENCOUNTER — Encounter: Payer: Self-pay | Admitting: Registered Nurse

## 2016-11-19 ENCOUNTER — Encounter: Payer: Medicare Other | Attending: Physical Medicine & Rehabilitation | Admitting: Registered Nurse

## 2016-11-19 VITALS — BP 116/65 | HR 77

## 2016-11-19 DIAGNOSIS — M5416 Radiculopathy, lumbar region: Secondary | ICD-10-CM | POA: Diagnosis not present

## 2016-11-19 DIAGNOSIS — R2689 Other abnormalities of gait and mobility: Secondary | ICD-10-CM | POA: Insufficient documentation

## 2016-11-19 DIAGNOSIS — M1711 Unilateral primary osteoarthritis, right knee: Secondary | ICD-10-CM | POA: Insufficient documentation

## 2016-11-19 DIAGNOSIS — I2510 Atherosclerotic heart disease of native coronary artery without angina pectoris: Secondary | ICD-10-CM | POA: Diagnosis not present

## 2016-11-19 DIAGNOSIS — Z5181 Encounter for therapeutic drug level monitoring: Secondary | ICD-10-CM | POA: Diagnosis not present

## 2016-11-19 DIAGNOSIS — G894 Chronic pain syndrome: Secondary | ICD-10-CM

## 2016-11-19 DIAGNOSIS — M1712 Unilateral primary osteoarthritis, left knee: Secondary | ICD-10-CM

## 2016-11-19 DIAGNOSIS — I2583 Coronary atherosclerosis due to lipid rich plaque: Secondary | ICD-10-CM | POA: Diagnosis not present

## 2016-11-19 DIAGNOSIS — M797 Fibromyalgia: Secondary | ICD-10-CM | POA: Diagnosis not present

## 2016-11-19 DIAGNOSIS — Z79899 Other long term (current) drug therapy: Secondary | ICD-10-CM

## 2016-11-19 MED ORDER — DICLOFENAC SODIUM 1 % TD GEL: 2.0000 g | Freq: Three times a day (TID) | TRANSDERMAL | 2 refills | Status: DC | PRN

## 2016-11-19 MED ORDER — OXYCODONE HCL 10 MG PO TABS: 10.0000 mg | ORAL_TABLET | Freq: Four times a day (QID) | ORAL | 0 refills | Status: DC

## 2016-11-19 NOTE — Progress Notes (Signed)
Subjective:    Patient ID: Linda [name], female    DOB: 1946-10-02, 70 y.o.   MRN: [id number]  HPI: [name] is a 70 year old female who returns for follow up appointmentfor chronic pain and medication refill. She states her pain is located in herlower back radiating into her right  lower extremities laterally and posteriorly and bilateral knee pain. She rates her pain 6. Her current exercise regime is performing stretching exercises, stationary peddle bicycle for upper and lower extremities  ( upper for 10 minutes and lower for 5 minutes) three to four times a week, also using the Nu-Step three days a week and walking.   Pain Inventory Average Pain 8 Pain Right Now 6 My pain is sharp, stabbing and aching  In the last 24 hours, has pain interfered with the following? General activity 9 Relation with others 9 Enjoyment of life 10 What TIME of day is your pain at its worst? all Sleep (in general) Fair  Pain is worse with: walking, standing and some activites Pain improves with: rest and medication Relief from Meds: 6  Mobility walk without assistance use a walker how many minutes can you walk? 10 ability to climb steps?  no do you drive?  yes Do you have any goals in this area?  yes  Function retired I need assistance with the following:  meal prep and shopping  Neuro/Psych bladder control problems weakness trouble walking  Prior Studies Any changes since last visit?  no  Physicians involved in your care Any changes since last visit?  no   Family History  Problem Relation Age of Onset  . Arthritis Mother   . Kidney disease Mother   . Heart disease Father   . Stroke Father   . Hypertension Father   . Diabetes Father   . Heart attack Father   . Heart attack Paternal Grandmother    Social History   Social History  . Marital status: Widowed    Spouse name: N/A  . Number of children: 2  . Years of education: N/A   Occupational  History  . Retired    Social History Main Topics  . Smoking status: Never Smoker  . Smokeless tobacco: Never Used     Comment: never used tobacco  . Alcohol use No  . Drug use: No  . Sexual activity: Not Currently   Other Topics Concern  . Not on file   Social History Narrative   Widowed, 2 sons.  Relocated to [place] 09/2012 to be closer to her son who has MS.   Husband d 2015--mesothelioma.   Occupation: former Pharmacist, hospital.   Education: masters degree level.   No T/A/Ds.   Lives in [place], independent living.   Past Surgical History:  Procedure Laterality Date  . APPENDECTOMY  1960  . AXILLARY SURGERY Left 1978   Multiple "lump" in armpit per pt  . CARDIAC CATHETERIZATION  01/2013   nonobstructive CAD, EF 55-60%  . CARDIOVASCULAR STRESS TEST  02/22/15   Low risk myocard perf imaging; wall motion normal, normal EF  . COCCYX REMOVAL  1972  . DILATION AND CURETTAGE OF UTERUS  ? 1970's  . ESOPHAGOGASTRODUODENOSCOPY N/A 07/19/2014   Gastritis found + in the setting of supratherapeutic INR, +plavix, + meloxicam.  . EYE SURGERY Left 2012-2013   "injections for ~ 1 yr; don't really know what for" (07/12/2013)  . HEEL SPUR SURGERY Left 2008  . KNEE SURGERY  2005  .  LEFT HEART CATHETERIZATION WITH CORONARY ANGIOGRAM N/A 01/30/2013   Procedure: LEFT HEART CATHETERIZATION WITH CORONARY ANGIOGRAM;  Surgeon: Clent Demark, MD;  Location: [facility] CATH LAB;  Service: Cardiovascular;  Laterality: N/A;  . PLANTAR FASCIA RELEASE Left 2008  . SPIROMETRY  04/25/14   In hosp for acute asthma/COPD flare: mixed obstructive and restrictive lung disease. The FEV1 is severely reduced at 45% predicted.  FEV1 signif decreased compared to prior spirometry 07/23/13.  . TENDON RELEASE  1996   Right forearm and hand  . TOTAL ABDOMINAL HYSTERECTOMY  1974  . TRANSTHORACIC ECHOCARDIOGRAM  01/2013; 04/2014;08/2015   2014--NORMAL.  2015--focal basal septal hypertrophy, EF 55-60%, grade I diast dysfxn, mild LAE.  08/2015 EF  55-60%, nl LV syst fxn, grade I DD, valves wnl   Past Medical History:  Diagnosis Date  . Acute upper GI bleed 06/2014   while pt taking coumadin, plavix, and meloxicam---despite being told not to take coumadin.  . Anginal pain ([place])    Nonobstructive CAD 2014; however, her cardiologist put her on a statin for this and NOT for hyperlipidemia per pt report.  . Anxiety    panic attacks  . Asthma    w/ asbestososis   . BPPV (benign paroxysmal positional vertigo) 12/16/2012  . Chronic diastolic CHF (congestive heart failure) ([facility])    dry wt as of 11/06/16 is 168 lbs.  . Chronic lower back pain   . COPD (chronic obstructive pulmonary disease) ([place])   . DDD (degenerative disc disease)    lumbar and cervical.   . Depression   . Diverticular disease   . Fibromyalgia    Patient states dx was around her late 71s but she had sx's for years prior to this.  . H/O hiatal hernia   . Hay fever   . History of pneumonia    hospitalized 12/2011, 02/2013, and 07/2013 [facility]) for this  . HTN (hypertension)   . Idiopathic angio-edema-urticaria 72014   Angioedema component was very minimal  . Insomnia   . Iron deficiency anemia    Hematologist in [place], [place] did extensive w/u; no cause found; failed oral supplement;; gets fairly regular (q5m or so) IV iron infusions (Venofer -iron sucrose- 200mg  with procrit.  "for 14 yr I've been getting blood work q month & getting infusions prn" (07/12/2013).  Dr. Marin Olp locally, iron infusions done, EPO deficiency dx'd  . Migraine syndrome    "not as often anymore; used to be ~ q wk" (07/12/2013)  . Mixed incontinence urge and stress   . Nephrolithiasis    "passed all on my own or they are still in there" (07/12/2013)  . OSA on CPAP    prior to move to --had another sleep study 10/2015 w/pulm Dr. Camillo Flaming.  . Osteoarthritis    "severe; progressing fast" (07/12/2013); multiple joints-not surgical candidate for TKR (03/2015)  . Pernicious anemia 08/24/2014  . Pleural  plaque with presence of asbestos 07/22/2013  . Pulmonary embolism ([place]) 07/2013   Dx at [facility] with very small peripheral upper lobe pe 07/2013: pt took coumadin for about 8-9 mo  . Pyelonephritis    "several times over the last 30 yr" (07/12/2013)  . RBBB (right bundle branch block)   . Recurrent UTI    hx of hospitalization for pyelonephritis; started abx prophylaxis 06/2015  . Syncope    Hypotensive; ED visit--Dr. Terrence Dupont did Cath--nonobstructive CAD, EF 55-60%.  In retrospect, suspect pt rx med misuse/polypharmacy  . Tension headache, chronic    There were  no vitals taken for this visit.  Opioid Risk Score:   Fall Risk Score:  `1  Depression screen PHQ 2/9  Depression screen [facility] 2/9 08/01/2016 06/12/2016 04/19/2016 08/11/2015 07/12/2015 03/27/2015  Decreased Interest 3 1 0 3 0 3  Down, Depressed, Hopeless 3 0 0 3 1 3   PHQ - 2 Score 6 1 0 6 1 6   Altered sleeping - - - 2 - 2  Tired, decreased energy - - - 3 - 3  Change in appetite - - - 0 - 2  Feeling bad or failure about yourself  - - - 2 - 2  Trouble concentrating - - - 0 - 2  Moving slowly or fidgety/restless - - - 0 - 0  Suicidal thoughts - - - 0 - 0  PHQ-9 Score - - - 13 - 17  Some recent data might be hidden   Review of Systems  HENT: Negative.   Eyes: Negative.   Respiratory: Positive for shortness of breath.   Cardiovascular: Negative.   Gastrointestinal: Positive for nausea.  Endocrine: Negative.   Genitourinary: Positive for difficulty urinating.  Musculoskeletal: Positive for back pain, gait problem and joint swelling.  Skin: Negative.   Allergic/Immunologic: Negative.   Neurological: Positive for weakness.  Hematological: Negative.   Psychiatric/Behavioral: The patient is nervous/anxious.   All other systems reviewed and are negative.      Objective:   Physical Exam  Constitutional: She is oriented to person, place, and time. She appears well-developed and well-nourished.  HENT:  Head: Normocephalic and  atraumatic.  Neck: Normal range of motion. Neck supple.  Cardiovascular: Normal rate and regular rhythm.   Pulmonary/Chest: Effort normal and breath sounds normal.  Continuous Oxygen 3 liters nasal cannula  Musculoskeletal:  Normal Muscle Bulk and Muscle Testing Reveals: Upper Extremities: Full ROM and Muscle Strength 5/5 Lumbar Paraspinal Tenderness: L-3-L-5 Lower Extremities: Full ROM and Muscle Strength 5/5 Bilateral Lower Extremities Flexion Produces pain into Bilateral Patella's Arises from chair slowly using walker for support Narrow Based Gait   Neurological: She is alert and oriented to person, place, and time.  Skin: Skin is warm and dry.  Psychiatric: She has a normal mood and affect.  Nursing note and vitals reviewed.         Assessment & Plan:  1. Functional deficits secondary to Gait disorder: Continue with HEP. 2. Chronic Back pain/ Lumbar Radiculitis/fibromyalgia/R>L Knee OA Pain Management:  Refilled: Oxycodone 10 mgone tablet every 6 hours #100/ Continue Lyrica and Voltaren Gel We will continue the opioid monitoring program, this consists of regular clinic visits, examinations, urine drug screen, pill counts as well as use of [place] Controlled Substance reporting System. 3. Depression with anxiety/Grief reaction/Mood: Continue Xanax, Trazodone and Cymbalta .  4. [name] with asthma: Oxygen dependent. Albuterol prn.  On Continuous Oxygen Therapy: Wearing 3 liters [id number] Following. 5. Bilateral Osteoarthritis of Bilateral Knees: Dr. Juanda Bond Following. 6. Fibromyalgia: Continue Lyrica andexercise regime.  20 minutes of face to face patient care time was spent during this visit. All questions were encouraged and answered.  F/U in 1 month

## 2016-12-04 ENCOUNTER — Encounter (HOSPITAL_COMMUNITY): Payer: Self-pay | Admitting: Emergency Medicine

## 2016-12-04 ENCOUNTER — Emergency Department (HOSPITAL_COMMUNITY): Payer: Medicare Other

## 2016-12-04 ENCOUNTER — Inpatient Hospital Stay (HOSPITAL_COMMUNITY)
Admission: EM | Admit: 2016-12-04 | Discharge: 2016-12-06 | DRG: 641 | Disposition: A | Discharge status: Home Health | Payer: Medicare Other | Attending: Internal Medicine | Admitting: Internal Medicine

## 2016-12-04 DIAGNOSIS — E876 Hypokalemia: Secondary | ICD-10-CM | POA: Diagnosis not present

## 2016-12-04 DIAGNOSIS — Z86711 Personal history of pulmonary embolism: Secondary | ICD-10-CM

## 2016-12-04 DIAGNOSIS — Z7951 Long term (current) use of inhaled steroids: Secondary | ICD-10-CM

## 2016-12-04 DIAGNOSIS — F419 Anxiety disorder, unspecified: Secondary | ICD-10-CM | POA: Diagnosis present

## 2016-12-04 DIAGNOSIS — M545 Low back pain: Secondary | ICD-10-CM | POA: Diagnosis not present

## 2016-12-04 DIAGNOSIS — Z7982 Long term (current) use of aspirin: Secondary | ICD-10-CM

## 2016-12-04 DIAGNOSIS — Z66 Do not resuscitate: Secondary | ICD-10-CM | POA: Diagnosis present

## 2016-12-04 DIAGNOSIS — M546 Pain in thoracic spine: Secondary | ICD-10-CM | POA: Diagnosis not present

## 2016-12-04 DIAGNOSIS — S299XXA Unspecified injury of thorax, initial encounter: Secondary | ICD-10-CM | POA: Diagnosis not present

## 2016-12-04 DIAGNOSIS — J9611 Chronic respiratory failure with hypoxia: Secondary | ICD-10-CM | POA: Diagnosis not present

## 2016-12-04 DIAGNOSIS — J61 Pneumoconiosis due to asbestos and other mineral fibers: Secondary | ICD-10-CM | POA: Diagnosis not present

## 2016-12-04 DIAGNOSIS — I5032 Chronic diastolic (congestive) heart failure: Secondary | ICD-10-CM | POA: Diagnosis present

## 2016-12-04 DIAGNOSIS — I110 Hypertensive heart disease with heart failure: Secondary | ICD-10-CM | POA: Diagnosis present

## 2016-12-04 DIAGNOSIS — R531 Weakness: Secondary | ICD-10-CM | POA: Diagnosis not present

## 2016-12-04 DIAGNOSIS — Z9981 Dependence on supplemental oxygen: Secondary | ICD-10-CM

## 2016-12-04 DIAGNOSIS — S79912A Unspecified injury of left hip, initial encounter: Secondary | ICD-10-CM | POA: Diagnosis not present

## 2016-12-04 DIAGNOSIS — M549 Dorsalgia, unspecified: Secondary | ICD-10-CM | POA: Diagnosis not present

## 2016-12-04 DIAGNOSIS — F329 Major depressive disorder, single episode, unspecified: Secondary | ICD-10-CM | POA: Diagnosis present

## 2016-12-04 DIAGNOSIS — E860 Dehydration: Principal | ICD-10-CM | POA: Diagnosis present

## 2016-12-04 DIAGNOSIS — F418 Other specified anxiety disorders: Secondary | ICD-10-CM | POA: Diagnosis not present

## 2016-12-04 DIAGNOSIS — M797 Fibromyalgia: Secondary | ICD-10-CM | POA: Diagnosis present

## 2016-12-04 DIAGNOSIS — S79911A Unspecified injury of right hip, initial encounter: Secondary | ICD-10-CM | POA: Diagnosis not present

## 2016-12-04 DIAGNOSIS — I10 Essential (primary) hypertension: Secondary | ICD-10-CM | POA: Diagnosis present

## 2016-12-04 DIAGNOSIS — Z79899 Other long term (current) drug therapy: Secondary | ICD-10-CM

## 2016-12-04 DIAGNOSIS — S199XXA Unspecified injury of neck, initial encounter: Secondary | ICD-10-CM | POA: Diagnosis not present

## 2016-12-04 DIAGNOSIS — R42 Dizziness and giddiness: Secondary | ICD-10-CM | POA: Diagnosis not present

## 2016-12-04 DIAGNOSIS — I2510 Atherosclerotic heart disease of native coronary artery without angina pectoris: Secondary | ICD-10-CM | POA: Diagnosis present

## 2016-12-04 DIAGNOSIS — M25552 Pain in left hip: Secondary | ICD-10-CM | POA: Diagnosis not present

## 2016-12-04 DIAGNOSIS — G8929 Other chronic pain: Secondary | ICD-10-CM | POA: Diagnosis present

## 2016-12-04 DIAGNOSIS — J9610 Chronic respiratory failure, unspecified whether with hypoxia or hypercapnia: Secondary | ICD-10-CM | POA: Diagnosis present

## 2016-12-04 DIAGNOSIS — J449 Chronic obstructive pulmonary disease, unspecified: Secondary | ICD-10-CM | POA: Diagnosis present

## 2016-12-04 DIAGNOSIS — H8110 Benign paroxysmal vertigo, unspecified ear: Secondary | ICD-10-CM | POA: Diagnosis present

## 2016-12-04 DIAGNOSIS — M6282 Rhabdomyolysis: Secondary | ICD-10-CM | POA: Diagnosis present

## 2016-12-04 DIAGNOSIS — M25551 Pain in right hip: Secondary | ICD-10-CM | POA: Diagnosis not present

## 2016-12-04 LAB — COMPREHENSIVE METABOLIC PANEL
ALBUMIN: 5.2 g/dL — AB (ref 3.5–5.0)
ALT: 23 U/L (ref 14–54)
ANION GAP: 14 (ref 5–15)
AST: 33 U/L (ref 15–41)
Alkaline Phosphatase: 108 U/L (ref 38–126)
BILIRUBIN TOTAL: 1.3 mg/dL — AB (ref 0.3–1.2)
BUN: 8 mg/dL (ref 6–20)
CO2: 30 mmol/L (ref 22–32)
Calcium: 9.9 mg/dL (ref 8.9–10.3)
Chloride: 95 mmol/L — ABNORMAL LOW (ref 101–111)
Creatinine, Ser: 0.64 mg/dL (ref 0.44–1.00)
GFR calc Af Amer: >60 mL/min (ref >60)
GFR calc non Af Amer: >60 mL/min (ref >60)
GLUCOSE: 126 mg/dL — AB (ref 65–99)
POTASSIUM: 2.6 mmol/L — AB (ref 3.5–5.1)
Sodium: 139 mmol/L (ref 135–145)
TOTAL PROTEIN: 9.1 g/dL — AB (ref 6.5–8.1)

## 2016-12-04 LAB — CBC WITH DIFFERENTIAL/PLATELET
BASOS ABS: 0 10*3/uL (ref 0.0–0.1)
Basophils Relative: 0 %
Eosinophils Absolute: 0 10*3/uL (ref 0.0–0.7)
Eosinophils Relative: 0 %
HEMATOCRIT: 47 % — AB (ref 36.0–46.0)
Hemoglobin: 16.1 g/dL — ABNORMAL HIGH (ref 12.0–15.0)
LYMPHS PCT: 8 %
Lymphs Abs: 1 10*3/uL (ref 0.7–4.0)
MCH: 30.2 pg (ref 26.0–34.0)
MCHC: 34.3 g/dL (ref 30.0–36.0)
MCV: 88.2 fL (ref 78.0–100.0)
MONO ABS: 0.7 10*3/uL (ref 0.1–1.0)
Monocytes Relative: 5 %
NEUTROS ABS: 11.8 10*3/uL — AB (ref 1.7–7.7)
Neutrophils Relative %: 87 %
Platelets: 229 10*3/uL (ref 150–400)
RBC: 5.33 MIL/uL — AB (ref 3.87–5.11)
RDW: 12.7 % (ref 11.5–15.5)
WBC: 13.5 10*3/uL — ABNORMAL HIGH (ref 4.0–10.5)

## 2016-12-04 LAB — URINALYSIS, ROUTINE W REFLEX MICROSCOPIC
BILIRUBIN URINE: NEGATIVE
Glucose, UA: NEGATIVE mg/dL
Ketones, ur: 5 mg/dL — AB
LEUKOCYTES UA: NEGATIVE
NITRITE: NEGATIVE
Protein, ur: NEGATIVE mg/dL
Specific Gravity, Urine: 1.011 (ref 1.005–1.030)
pH: 5 (ref 5.0–8.0)

## 2016-12-04 LAB — CK: Total CK: 763 U/L — ABNORMAL HIGH (ref 38–234)

## 2016-12-04 LAB — I-STAT CG4 LACTIC ACID, ED: LACTIC ACID, VENOUS: 1.81 mmol/L (ref 0.5–1.9)

## 2016-12-04 LAB — I-STAT TROPONIN, ED: TROPONIN I, POC: 0 ng/mL (ref 0.00–0.08)

## 2016-12-04 MED ORDER — FOLIC ACID 1 MG PO TABS
1.0000 mg | ORAL_TABLET | Freq: Every day | ORAL | Status: DC
  Administered 2016-12-05 – 2016-12-06 (×2): 1 mg via ORAL
  Filled 2016-12-04 (×2): qty 1

## 2016-12-04 MED ORDER — POTASSIUM CHLORIDE CRYS ER 20 MEQ PO TBCR
40.0000 meq | EXTENDED_RELEASE_TABLET | Freq: Once | ORAL | Status: AC
  Administered 2016-12-04: 40 meq via ORAL
  Filled 2016-12-04: qty 2

## 2016-12-04 MED ORDER — ISOSORBIDE MONONITRATE ER 30 MG PO TB24
30.0000 mg | ORAL_TABLET | Freq: Every day | ORAL | Status: DC
  Administered 2016-12-05 – 2016-12-06 (×2): 30 mg via ORAL
  Filled 2016-12-04 (×2): qty 1

## 2016-12-04 MED ORDER — TRAZODONE HCL 50 MG PO TABS
100.0000 mg | ORAL_TABLET | Freq: Every evening | ORAL | Status: DC | PRN
  Administered 2016-12-04 – 2016-12-05 (×2): 100 mg via ORAL
  Filled 2016-12-04 (×2): qty 2

## 2016-12-04 MED ORDER — ATORVASTATIN CALCIUM 10 MG PO TABS
20.0000 mg | ORAL_TABLET | Freq: Every day | ORAL | Status: DC
  Administered 2016-12-04: 20 mg via ORAL
  Filled 2016-12-04: qty 2

## 2016-12-04 MED ORDER — ENOXAPARIN SODIUM 40 MG/0.4ML ~~LOC~~ SOLN
40.0000 mg | SUBCUTANEOUS | Status: DC
  Administered 2016-12-04 – 2016-12-05 (×2): 40 mg via SUBCUTANEOUS
  Filled 2016-12-04 (×2): qty 0.4

## 2016-12-04 MED ORDER — SODIUM CHLORIDE 0.9 % IV SOLN
Freq: Once | INTRAVENOUS | Status: AC
  Administered 2016-12-04: 125 mL/h via INTRAVENOUS

## 2016-12-04 MED ORDER — OXYCODONE HCL 5 MG PO TABS
10.0000 mg | ORAL_TABLET | Freq: Four times a day (QID) | ORAL | Status: DC | PRN
  Administered 2016-12-04 – 2016-12-06 (×6): 10 mg via ORAL
  Filled 2016-12-04 (×6): qty 2

## 2016-12-04 MED ORDER — ALBUTEROL SULFATE (2.5 MG/3ML) 0.083% IN NEBU: 2.5000 mg | INHALATION_SOLUTION | Freq: Four times a day (QID) | RESPIRATORY_TRACT | Status: DC | PRN

## 2016-12-04 MED ORDER — SODIUM CHLORIDE 0.9 % IV BOLUS (SEPSIS)
1000.0000 mL | Freq: Once | INTRAVENOUS | Status: AC
  Administered 2016-12-04: 1000 mL via INTRAVENOUS

## 2016-12-04 MED ORDER — VITAMIN D (ERGOCALCIFEROL) 1.25 MG (50000 UNIT) PO CAPS: 50000.0000 [IU] | ORAL_CAPSULE | ORAL | Status: DC

## 2016-12-04 MED ORDER — DULOXETINE HCL 60 MG PO CPEP
90.0000 mg | ORAL_CAPSULE | Freq: Every day | ORAL | Status: DC
  Administered 2016-12-05: 11:00:00 90 mg via ORAL
  Filled 2016-12-04: qty 1

## 2016-12-04 MED ORDER — ALPRAZOLAM 0.5 MG PO TABS
0.5000 mg | ORAL_TABLET | Freq: Three times a day (TID) | ORAL | Status: DC | PRN
  Administered 2016-12-04 – 2016-12-05 (×3): 0.5 mg via ORAL
  Filled 2016-12-04 (×3): qty 1

## 2016-12-04 MED ORDER — ONDANSETRON HCL 4 MG/2ML IJ SOLN
4.0000 mg | Freq: Once | INTRAMUSCULAR | Status: AC
  Administered 2016-12-04: 4 mg via INTRAVENOUS
  Filled 2016-12-04: qty 2

## 2016-12-04 MED ORDER — PANTOPRAZOLE SODIUM 40 MG PO TBEC
40.0000 mg | DELAYED_RELEASE_TABLET | Freq: Every day | ORAL | Status: DC
  Administered 2016-12-05 – 2016-12-06 (×2): 40 mg via ORAL
  Filled 2016-12-04 (×2): qty 1

## 2016-12-04 MED ORDER — SODIUM CHLORIDE 0.9 % IV SOLN
INTRAVENOUS | Status: DC
  Administered 2016-12-04: 23:00:00 via INTRAVENOUS
  Administered 2016-12-06: 1000 mL via INTRAVENOUS

## 2016-12-04 MED ORDER — FENTANYL CITRATE (PF) 100 MCG/2ML IJ SOLN
50.0000 ug | Freq: Once | INTRAMUSCULAR | Status: AC
  Administered 2016-12-04: 50 ug via INTRAVENOUS
  Filled 2016-12-04: qty 2

## 2016-12-04 MED ORDER — DICLOFENAC SODIUM 1 % TD GEL
2.0000 g | Freq: Three times a day (TID) | TRANSDERMAL | Status: DC | PRN
  Administered 2016-12-06: 08:00:00 2 g via TOPICAL
  Filled 2016-12-04: qty 100

## 2016-12-04 MED ORDER — METOPROLOL SUCCINATE ER 25 MG PO TB24
25.0000 mg | ORAL_TABLET | Freq: Two times a day (BID) | ORAL | Status: DC
  Administered 2016-12-04 – 2016-12-06 (×4): 25 mg via ORAL
  Filled 2016-12-04 (×4): qty 1

## 2016-12-04 MED ORDER — ASPIRIN EC 81 MG PO TBEC
81.0000 mg | DELAYED_RELEASE_TABLET | Freq: Every day | ORAL | Status: DC
  Administered 2016-12-04 – 2016-12-05 (×2): 81 mg via ORAL
  Filled 2016-12-04 (×2): qty 1

## 2016-12-04 MED ORDER — POLYETHYLENE GLYCOL 3350 17 G PO PACK
17.0000 g | PACK | Freq: Every day | ORAL | Status: DC | PRN
  Administered 2016-12-06: 17 g via ORAL
  Filled 2016-12-04: qty 1

## 2016-12-04 MED ORDER — ADULT MULTIVITAMIN W/MINERALS CH
1.0000 | ORAL_TABLET | Freq: Every day | ORAL | Status: DC
  Administered 2016-12-05 – 2016-12-06 (×2): 1 via ORAL
  Filled 2016-12-04 (×2): qty 1

## 2016-12-04 MED ORDER — PREGABALIN 75 MG PO CAPS
150.0000 mg | ORAL_CAPSULE | Freq: Two times a day (BID) | ORAL | Status: DC
  Administered 2016-12-04: 150 mg via ORAL
  Filled 2016-12-04 (×2): qty 2

## 2016-12-04 MED ORDER — ARIPIPRAZOLE 10 MG PO TABS
10.0000 mg | ORAL_TABLET | Freq: Every day | ORAL | Status: DC
  Administered 2016-12-05 – 2016-12-06 (×2): 10 mg via ORAL
  Filled 2016-12-04 (×2): qty 1

## 2016-12-04 MED ORDER — SODIUM CHLORIDE 0.9 % IV SOLN
INTRAVENOUS | Status: DC
  Administered 2016-12-04: 20:00:00 via INTRAVENOUS

## 2016-12-04 MED ORDER — MOMETASONE FURO-FORMOTEROL FUM 200-5 MCG/ACT IN AERO
2.0000 | INHALATION_SPRAY | Freq: Two times a day (BID) | RESPIRATORY_TRACT | Status: DC
  Administered 2016-12-04 – 2016-12-05 (×3): 2 via RESPIRATORY_TRACT
  Filled 2016-12-04: qty 8.8

## 2016-12-04 NOTE — Progress Notes (Signed)
CSW spoke with patient's son, Gershon Mussel at bedside to obtain information. He stated he is patient's main support. He stated patient became dizzy last night and fell. He stated she laid in the floor until this morning. Patient is from [place] and per patient's son, she has lived there for two years. He stated patient was completing her own ADL's and she uses a walker and oxygen. No questions were noted for CSW at this time.   Merry Proud, LCSWA Clinical Social Worker [phone] 12:26 PM

## 2016-12-04 NOTE — ED Notes (Signed)
Spoke with Butch Penny RN on [place], was told she can't take report at this time and will call me back.

## 2016-12-04 NOTE — H&P (Signed)
History and Physical    Linda [name] [id number] DOB: 09/28/1946 DOA: 12/04/2016  PCP: Tammi Sou, MD  Patient coming from: [facility]  Chief Complaint: Fall  HPI: Linda [name] is a 70 y.o. female with medical history significant of BPPV, anxiety, depression, asbestosis, diastolic CHF, fibromyalgia, hypertension. Yesterday, while trying to walk across her room (around 15-20 feet) she felt lightheaded and dizziness before falling face first into her chair. She did not lose consciousness. She got immediately, but then fell again on her back, where she lay for about 14 hours. before someone from her ILF called to check and responded to her cries for help.  ED Course: Vitals: Afebrile. Slightly hypertensive. Labs: Potassium of 2.6. CK of 763, WBC of 13.5 Imaging: CXR significant for no acute disease. Back x-rays significant for degenerative disease at L3-4. CT head and spine significant for no acute intracranial abnormality, stable cerebral atrophy and degenerative changes of C1-2 with mild anterior/posterior spurring at C5-6 and C6-7. Medications/Course: fentanyl  Review of Systems: Review of Systems  Constitutional: Negative for chills and fever.  Respiratory: Positive for shortness of breath. Negative for cough, sputum production and wheezing.   Cardiovascular: Negative for chest pain and palpitations.  Gastrointestinal: Positive for nausea. Negative for abdominal pain, blood in stool, constipation, diarrhea and vomiting.  Neurological: Positive for dizziness.  All other systems reviewed and are negative.   Past Medical History:  Diagnosis Date  . Acute upper GI bleed 06/2014   while pt taking coumadin, plavix, and meloxicam---despite being told not to take coumadin.  . Anginal pain ([place])    Nonobstructive CAD 2014; however, her cardiologist put her on a statin for this and NOT for hyperlipidemia per pt report.  . Anxiety    panic attacks  . Asthma    w/ asbestososis   . BPPV (benign paroxysmal positional vertigo) 12/16/2012  . Chronic diastolic CHF (congestive heart failure) (HCC)    dry wt as of 11/06/16 is 168 lbs.  . Chronic lower back pain   . COPD (chronic obstructive pulmonary disease) ([place])   . DDD (degenerative disc disease)    lumbar and cervical.   . Depression   . Diverticular disease   . Fibromyalgia    Patient states dx was around her late 15s but she had sx's for years prior to this.  . H/O hiatal hernia   . Hay fever   . History of pneumonia    hospitalized 12/2011, 02/2013, and 07/2013 [facility]) for this  . HTN (hypertension)   . Idiopathic angio-edema-urticaria 72014   Angioedema component was very minimal  . Insomnia   . Iron deficiency anemia    Hematologist in [place], [place] did extensive w/u; no cause found; failed oral supplement;; gets fairly regular (q63m or so) IV iron infusions (Venofer -iron sucrose- 200mg  with procrit.  "for 14 yr I've been getting blood work q month & getting infusions prn" (07/12/2013).  Dr. Marin Olp locally, iron infusions done, EPO deficiency dx'd  . Migraine syndrome    "not as often anymore; used to be ~ q wk" (07/12/2013)  . Mixed incontinence urge and stress   . Nephrolithiasis    "passed all on my own or they are still in there" (07/12/2013)  . OSA on CPAP    prior to move to [place]--had another sleep study 10/2015 w/pulm Dr. Camillo Flaming.  . Osteoarthritis    "severe; progressing fast" (07/12/2013); multiple joints-not surgical candidate for TKR (03/2015)  . Pernicious anemia 08/24/2014  .  Pleural plaque with presence of asbestos 07/22/2013  . Pulmonary embolism ([place]) 07/2013   Dx at [facility] with very small peripheral upper lobe pe 07/2013: pt took coumadin for about 8-9 mo  . Pyelonephritis    "several times over the last 30 yr" (07/12/2013)  . RBBB (right bundle branch block)   . Recurrent UTI    hx of hospitalization for pyelonephritis; started abx prophylaxis 06/2015  . Syncope    Hypotensive; ED  visit--Dr. Terrence Dupont did Cath--nonobstructive CAD, EF 55-60%.  In retrospect, suspect pt rx med misuse/polypharmacy  . Tension headache, chronic     Past Surgical History:  Procedure Laterality Date  . APPENDECTOMY  1960  . AXILLARY SURGERY Left 1978   Multiple "lump" in armpit per pt  . CARDIAC CATHETERIZATION  01/2013   nonobstructive CAD, EF 55-60%  . CARDIOVASCULAR STRESS TEST  02/22/15   Low risk myocard perf imaging; wall motion normal, normal EF  . COCCYX REMOVAL  1972  . DILATION AND CURETTAGE OF UTERUS  ? 1970's  . ESOPHAGOGASTRODUODENOSCOPY N/A 07/19/2014   Gastritis found + in the setting of supratherapeutic INR, +plavix, + meloxicam.  . EYE SURGERY Left 2012-2013   "injections for ~ 1 yr; don't really know what for" (07/12/2013)  . HEEL SPUR SURGERY Left 2008  . KNEE SURGERY  2005  . LEFT HEART CATHETERIZATION WITH CORONARY ANGIOGRAM N/A 01/30/2013   Procedure: LEFT HEART CATHETERIZATION WITH CORONARY ANGIOGRAM;  Surgeon: Clent Demark, MD;  [facility] LAB;  Service: Cardiovascular;  Laterality: N/A;  . PLANTAR FASCIA RELEASE Left 2008  . SPIROMETRY  04/25/14   In hosp for acute asthma/COPD flare: mixed obstructive and restrictive lung disease. The FEV1 is severely reduced at 45% predicted.  FEV1 signif decreased compared to prior spirometry 07/23/13.  . TENDON RELEASE  1996   Right forearm and hand  . TOTAL ABDOMINAL HYSTERECTOMY  1974  . TRANSTHORACIC ECHOCARDIOGRAM  01/2013; 04/2014;08/2015   2014--NORMAL.  2015--focal basal septal hypertrophy, EF 55-60%, grade I diast dysfxn, mild LAE.  08/2015 EF 55-60%, nl LV syst fxn, grade I DD, valves wnl     reports that she has never smoked. She has never used smokeless tobacco. She reports that she does not drink alcohol or use drugs.  Allergies  Allergen Reactions  . Penicillins Itching, Swelling and Rash    Tolerated Cefepime in ED. Has patient had a PCN reaction causing immediate rash, facial/tongue/throat swelling, SOB or  lightheadedness with hypotension: Yes Has patient had a PCN reaction causing severe rash involving mucus membranes or skin necrosis: No Has patient had a PCN reaction that required hospitalization: No  Has patient had a PCN reaction occurring within the last 10 years: No     Family History  Problem Relation Age of Onset  . Arthritis Mother   . Kidney disease Mother   . Heart disease Father   . Stroke Father   . Hypertension Father   . Diabetes Father   . Heart attack Father   . Heart attack Paternal Grandmother     Prior to Admission medications   Medication Sig Start Date End Date Taking? Authorizing Provider  acetaminophen (TYLENOL) 325 MG tablet Take 2 tablets (650 mg total) by mouth every 4 (four) hours as needed for headache or mild pain. 06/06/16  Yes Luke K Kilroy, PA-C  albuterol (PROVENTIL HFA;VENTOLIN HFA) 108 (90 BASE) MCG/ACT inhaler Inhale 2 puffs into the lungs every 6 (six) hours as needed for wheezing or shortness of  breath. 09/15/14  Yes Tammi Sou, MD  ALPRAZolam (XANAX) 1 MG tablet TAKE 1 TABLET THREE TIMES DAILY AS NEEDED FOR ANXIETY. 10/17/16  Yes Tammi Sou, MD  ARIPiprazole (ABILIFY) 10 MG tablet TAKE 1 TABLET ONCE DAILY. 10/17/16  Yes Tammi Sou, MD  aspirin 81 MG tablet Take 81 mg by mouth at bedtime.   Yes Historical Provider, MD  atorvastatin (LIPITOR) 20 MG tablet Take 20 mg by mouth at bedtime.    Yes Historical Provider, MD  budesonide-formoterol (SYMBICORT) 160-4.5 MCG/ACT inhaler Inhale 2 puffs into the lungs 2 (two) times daily. 10/06/14  Yes Irene Pap, NP  Cholecalciferol (VITAMIN D3) 50000 units CAPS Take 1 capsule by mouth every Monday.    Yes Historical Provider, MD  diclofenac sodium (VOLTAREN) 1 % GEL Apply 2 g topically 3 (three) times daily as needed (pain). 11/19/16  Yes Bayard Hugger, NP  DULoxetine (CYMBALTA) 30 MG capsule TAKE 1 CAPSULE ONCE DAILY ALONG WITH 60MG  CAPSULE FOR TOTAL 90MG  DAILY. Patient taking  differently: TAKE 30MG  BY MOUTH ONCE DAILY, ALONG WITH 60MG  CAPSULE FOR TOTAL 90MG  DAILY. 10/17/16  Yes Tammi Sou, MD  DULoxetine (CYMBALTA) 60 MG capsule Take 60 mg by mouth at bedtime. TO BE COMBINED WITH 30 MG TAB TO EQUAL 90 MG    Yes Historical Provider, MD  folic acid (FOLVITE) 1 MG tablet Take 2 tablets (2 mg total) by mouth daily. Patient taking differently: Take 1 mg by mouth daily.  09/15/14  Yes Tammi Sou, MD  furosemide (LASIX) 40 MG tablet Take 1-2 tablets (40-80 mg total) by mouth daily. 10/11/16  Yes Rhonda G Barrett, PA-C  isosorbide mononitrate (IMDUR) 30 MG 24 hr tablet Take 30 mg by mouth daily.   Yes Historical Provider, MD  LYRICA 300 MG capsule TAKE (1) CAPSULE TWICE DAILY. Patient taking differently: TAKE 300MG  DAILY. 11/01/16  Yes Volanda Napoleon, MD  metoprolol succinate (TOPROL XL) 25 MG 24 hr tablet Take 1 tablet (25 mg total) by mouth 2 (two) times daily. 11/07/16  Yes Rhonda G Barrett, PA-C  Multiple Vitamin (MULTIVITAMIN WITH MINERALS) TABS tablet Take 1 tablet by mouth daily.   Yes Historical Provider, MD  nitroGLYCERIN (NITROSTAT) 0.4 MG SL tablet Place 0.4 mg under the tongue every 5 (five) minutes as needed for chest pain (x 3 doses).    Yes Historical Provider, MD  Oxycodone HCl 10 MG TABS Take 1 tablet (10 mg total) by mouth every 6 (six) hours. 11/19/16  Yes Bayard Hugger, NP  OXYGEN Inhale 3 L into the lungs continuous.   Yes Historical Provider, MD  pantoprazole (PROTONIX) 40 MG tablet Take 40 mg by mouth daily.   Yes Historical Provider, MD  polyethylene glycol (MIRALAX / GLYCOLAX) packet Take 17 g by mouth daily as needed (constipation). Mix in 8 oz liquid and drink   Yes Historical Provider, MD  potassium chloride SA (K-DUR,KLOR-CON) 20 MEQ tablet Take 2-4 tablets (40-80 mEq total) by mouth daily. 10/11/16  Yes Rhonda G Barrett, PA-C  traZODone (DESYREL) 50 MG tablet TAKE 2-4 TABLETS AT BEDTIME 10/18/16  Yes Tammi Sou, MD    Physical  Exam: Vitals:   12/04/16 1149 12/04/16 1409 12/04/16 1600 12/04/16 1610  BP: 155/84 185/89 153/75 153/75  Pulse: 85 82 90 92  Resp: 19 22 17 23   Temp: 97.7 F (36.5 C)     TempSrc: Oral     SpO2: 92% 98% 96% 100%  Constitutional: NAD, calm, comfortable Eyes: PERRL, lids and conjunctivae normal ENMT: Mucous membranes are moist. Posterior pharynx clear of any exudate or lesions. Neck: normal, supple, no masses, no thyromegaly Respiratory: clear to auscultation bilaterally, no wheezing, no crackles. Normal respiratory effort. No accessory muscle use.  Cardiovascular: Regular rate and rhythm, no murmurs / rubs / gallops. No extremity edema. 2+ pedal pulses.  Abdomen: no tenderness, no masses palpated. No hepatosplenomegaly. Bowel sounds positive.  Musculoskeletal: mild tenderness along paraspinal muscles. Skin: no rashes, lesions, ulcers. No induration Neurologic: CN 2-12 grossly intact. Sensation intact, DTR normal. Strength 5/5 in all 4.  Psychiatric: Normal judgment and insight. Alert and oriented x 3. Normal mood.    Labs on Admission: I have personally reviewed following labs and imaging studies  CBC:  Recent Labs Lab 12/04/16 1349  WBC 13.5*  NEUTROABS 11.8*  HGB 16.1*  HCT 47.0*  MCV 88.2  PLT [id number]   Basic Metabolic Panel:  Recent Labs Lab 12/04/16 1349  NA 139  K 2.6*  CL 95*  CO2 30  GLUCOSE 126*  BUN 8  CREATININE 0.64  CALCIUM 9.9   GFR: CrCl cannot be calculated (Unknown ideal weight.). Liver Function Tests:  Recent Labs Lab 12/04/16 1349  AST 33  ALT 23  ALKPHOS 108  BILITOT 1.3*  PROT 9.1*  ALBUMIN 5.2*   No results for input(s): LIPASE, AMYLASE in the last 168 hours. No results for input(s): AMMONIA in the last 168 hours. Coagulation Profile: No results for input(s): INR, PROTIME in the last 168 hours. Cardiac Enzymes:  Recent Labs Lab 12/04/16 1349  CKTOTAL 763*   BNP (last 3 results) No results for input(s): PROBNP in the  last 8760 hours. HbA1C: No results for input(s): HGBA1C in the last 72 hours. CBG: No results for input(s): GLUCAP in the last 168 hours. Lipid Profile: No results for input(s): CHOL, HDL, LDLCALC, TRIG, CHOLHDL, LDLDIRECT in the last 72 hours. Thyroid Function Tests: No results for input(s): TSH, T4TOTAL, FREET4, T3FREE, THYROIDAB in the last 72 hours. Anemia Panel: No results for input(s): VITAMINB12, FOLATE, FERRITIN, TIBC, IRON, RETICCTPCT in the last 72 hours. Urine analysis:    Component Value Date/Time   COLORURINE YELLOW 12/04/2016 [place] 12/04/2016 1425   LABSPEC 1.011 12/04/2016 1425   PHURINE 5.0 12/04/2016 1425   GLUCOSEU NEGATIVE 12/04/2016 1425   HGBUR SMALL (A) 12/04/2016 1425   BILIRUBINUR NEGATIVE 12/04/2016 1425   BILIRUBINUR small 10/01/2016 1253   KETONESUR 5 (A) 12/04/2016 1425   PROTEINUR NEGATIVE 12/04/2016 1425   UROBILINOGEN 1.0 10/01/2016 1253   UROBILINOGEN 1.0 10/16/2015 2009   NITRITE NEGATIVE 12/04/2016 1425   LEUKOCYTESUR NEGATIVE 12/04/2016 1425   Sepsis Labs: !!!!!!!!!!!!!!!!!!!!!!!!!!!!!!!!!!!!!!!!!!!! @LABRCNTIP (procalcitonin:4,lacticidven:4) )No results found for this or any previous visit (from the past 240 hour(s)).   Radiological Exams on Admission: Dg Chest 2 View  Result Date: 12/04/2016 CLINICAL DATA:  Back pain after fall. EXAM: CHEST  2 VIEW COMPARISON:  Radiographs of May 31, 2016. FINDINGS: Stable cardiomediastinal silhouette. Atherosclerosis of thoracic aorta is noted. Stable calcified bilateral pleural plaques are noted. Both lungs are clear. The visualized skeletal structures are unremarkable. IMPRESSION: Aortic atherosclerosis. Stable calcified pleural plaques. No acute cardiopulmonary abnormality seen. Electronically Signed   By: Marijo Conception, M.D.   On: 12/04/2016 13:34   Dg Thoracic Spine 2 View  Result Date: 12/04/2016 CLINICAL DATA:  Upper back pain after fall. EXAM: THORACIC SPINE 2 VIEWS  COMPARISON:  CT scan of January 01, 2016. FINDINGS: There is no evidence of thoracic spine fracture. Alignment is normal. No other significant bone abnormalities are identified. IMPRESSION: No significant abnormality seen in the thoracic spine. Electronically Signed   By: Marijo Conception, M.D.   On: 12/04/2016 13:38   Dg Lumbar Spine Complete  Result Date: 12/04/2016 CLINICAL DATA:  Low back pain after fall. EXAM: LUMBAR SPINE - COMPLETE 4+ VIEW COMPARISON:  Radiographs of September 13, 2015. FINDINGS: No fracture is noted. Stable grade 1 anterolisthesis of L5-S1 is noted secondary to posterior facet joint hypertrophy. Mild degenerative disc disease is noted at L3-4. Remaining disc spaces appear intact. IMPRESSION: Mild degenerative disc disease is noted L3-4. No acute abnormality seen in the lumbar spine. Electronically Signed   By: Marijo Conception, M.D.   On: 12/04/2016 13:36   Ct Head Wo Contrast  Result Date: 12/04/2016 CLINICAL DATA:  Dizziness, back pain, fall EXAM: CT HEAD WITHOUT CONTRAST CT CERVICAL SPINE WITHOUT CONTRAST TECHNIQUE: Multidetector CT imaging of the head and cervical spine was performed following the standard protocol without intravenous contrast. Multiplanar CT image reconstructions of the cervical spine were also generated. COMPARISON:  01/01/2016 FINDINGS: CT HEAD FINDINGS Brain: No intracranial hemorrhage, mass effect or midline shift. Stable mild cerebral atrophy. No acute cortical infarction. Ventricular size is stable from prior exam. No mass lesion is noted on this unenhanced scan. Vascular: No hyperdense vessel or unexpected calcification. Skull: No skull fracture is noted. Sinuses/Orbits: Paranasal sinuses are unremarkable. No intraorbital hematoma. A Other: None CT CERVICAL SPINE FINDINGS Alignment: There is normal alignment. Skull base and vertebrae: Diffuse osteopenia is noted. Degenerative changes are noted C1-C2 articulation. Mild anterior and mild posterior spurring  at C5-C6 and C6-C7 level. Soft tissues and spinal canal: No prevertebral soft tissue swelling. Spinal canal is patent. Disc levels: There is disc space flattening at C5-C6 and C6-C7 level. Upper chest: No pneumothorax in visualized lung apices. There is pleuroparenchymal scarring in right apex medially Other: Atherosclerotic calcifications are noted bilateral carotid bifurcation IMPRESSION: 1. No acute intracranial abnormality.  Stable cerebral atrophy. 2. No cervical spine acute fracture or subluxation. Degenerative changes as described above. Electronically Signed   By: Lahoma Crocker M.D.   On: 12/04/2016 13:14   Ct Cervical Spine Wo Contrast  Result Date: 12/04/2016 CLINICAL DATA:  Dizziness, back pain, fall EXAM: CT HEAD WITHOUT CONTRAST CT CERVICAL SPINE WITHOUT CONTRAST TECHNIQUE: Multidetector CT imaging of the head and cervical spine was performed following the standard protocol without intravenous contrast. Multiplanar CT image reconstructions of the cervical spine were also generated. COMPARISON:  01/01/2016 FINDINGS: CT HEAD FINDINGS Brain: No intracranial hemorrhage, mass effect or midline shift. Stable mild cerebral atrophy. No acute cortical infarction. Ventricular size is stable from prior exam. No mass lesion is noted on this unenhanced scan. Vascular: No hyperdense vessel or unexpected calcification. Skull: No skull fracture is noted. Sinuses/Orbits: Paranasal sinuses are unremarkable. No intraorbital hematoma. A Other: None CT CERVICAL SPINE FINDINGS Alignment: There is normal alignment. Skull base and vertebrae: Diffuse osteopenia is noted. Degenerative changes are noted C1-C2 articulation. Mild anterior and mild posterior spurring at C5-C6 and C6-C7 level. Soft tissues and spinal canal: No prevertebral soft tissue swelling. Spinal canal is patent. Disc levels: There is disc space flattening at C5-C6 and C6-C7 level. Upper chest: No pneumothorax in visualized lung apices. There is  pleuroparenchymal scarring in right apex medially Other: Atherosclerotic calcifications are noted bilateral carotid bifurcation IMPRESSION: 1. No acute intracranial abnormality.  Stable cerebral atrophy. 2.  No cervical spine acute fracture or subluxation. Degenerative changes as described above. Electronically Signed   By: Lahoma Crocker M.D.   On: 12/04/2016 13:14   Dg Hips Bilat W Or Wo Pelvis 5 Views  Result Date: 12/04/2016 CLINICAL DATA:  Bilateral hip pain after fall. EXAM: DG HIP (WITH OR WITHOUT PELVIS) 5+V BILAT COMPARISON:  None. FINDINGS: There is no evidence of hip fracture or dislocation. There is no evidence of arthropathy or other focal bone abnormality. IMPRESSION: Normal bilateral hips. Electronically Signed   By: Marijo Conception, M.D.   On: 12/04/2016 13:40    EKG: Independently reviewed. Sinus. RBBB. Appears similar to previous  Assessment/Plan Principal Problem:   Rhabdomyolysis Active Problems:   HTN (hypertension), benign   Fibromyalgia syndrome   Chronic diastolic CHF, NYHA class 1   Anxiety and depression   [name] ([place])   Dehydration  Rhabdomyolysis Mild. Secondary to long lie syndrome -IV fluids  Dehydration Improved with fluids -continue IV fluids  Fall Likely secondary to polypharmacy. Patient also has a history of BPPV which may have played a part. Does not sound concerning for heart etiology. Imaging negative for acute abnormalities. No syncope. -decrease trazodone -change lyrica to twice daily dosing -PT eval  Chronic respiratory failure with hypoxia [name] On 3L O2 at home. Stable. -continue Dulera  Diastolic heart failure Dry on exam -daily weight  Hypokalemia Replete -recheck BMP in AM  Fibromyalgia -continued home oxycodone as PRN instead of scheduled -continue Cymbalta  Anxiety/Depression -continue abilify -continue Cymbalta -continue Xanax TID prn  CAD -continue aspirin -continue atorvastatin  Hypertension -continue  metoprolol -continue Imdur  Hypertension -continue metoprolol    DVT prophylaxis: Lovenox Code Status: DNR Family Communication: None at bedside Disposition Plan: Anticipate discharge tomorrow Consults called: None Admission status: Observation, medical floor    Cordelia Poche, MD [facility] Pager [phone]  If 7PM-7AM, please contact night-coverage [email or web address] Password [facility]  12/04/2016, 5:39 PM

## 2016-12-04 NOTE — ED Notes (Signed)
Patient has small veins. Made Charge RN aware that will need ultrasound IV needed.

## 2016-12-04 NOTE — ED Notes (Signed)
Tim RN attempted IV access x 1, but was unsuccessful.

## 2016-12-04 NOTE — ED Notes (Signed)
Gave patient family member coke and ham sandwich.

## 2016-12-04 NOTE — ED Notes (Signed)
Called floor to give report, RN unavailable at this time.

## 2016-12-04 NOTE — ED Triage Notes (Signed)
Per [facility] patient comes from [place] and fell around 8pm last night after getting dizzy. Staff was doing rounds this morning and found patient on the ground. Patient states that she was on floor all night. Patient c/o neck and back pain and bilat knee pain.

## 2016-12-04 NOTE — ED Notes (Signed)
Patient transported to CT 

## 2016-12-04 NOTE — Progress Notes (Signed)
Patient arrived to unit at around 1845. Patient is alert and oriented x 3. Oriented to room and environment. Admission MD notified. IVF infusing. Will endorsed to night nurse.

## 2016-12-04 NOTE — ED Notes (Signed)
Patient returned from radiology

## 2016-12-04 NOTE — ED Provider Notes (Signed)
[facility] DEPT Provider Note   CSN: [id number] Arrival date & time: 12/04/16  1129     History   Chief Complaint Chief Complaint  Patient presents with  . Fall  . Neck Pain  . Back Pain    HPI Linda [name] is a 70 y.o. female.  HPI   Pt with hx CAD, PE, CHF, COPD, DDD, fibromyalgia, syncope who lives in independent living at [facility] reports she has been "feeling bad" for the past 3 days, became dizzy last night (spinning) and fell forward, righted herself, then fell backwards, hitting the back of her head on the ground.  Had her head between a stool and a table and was on floor for 14 hours.  Currently reports constant pain in her head, neck, entire back, has chronic SOB and is on 3L [place] but has worsened SOB.  Denies fevers, sore throat, CP, sweats, abdominal pain, vomiting, diarrhea, urinary symptoms.   Son visited with patient yesterday and notes that she was doing well, had no complaints.    Past Medical History:  Diagnosis Date  . Acute upper GI bleed 06/2014   while pt taking coumadin, plavix, and meloxicam---despite being told not to take coumadin.  . Anginal pain ([place])    Nonobstructive CAD 2014; however, her cardiologist put her on a statin for this and NOT for hyperlipidemia per pt report.  . Anxiety    panic attacks  . Asthma    w/ asbestososis   . BPPV (benign paroxysmal positional vertigo) 12/16/2012  . Chronic diastolic CHF (congestive heart failure) (HCC)    dry wt as of 11/06/16 is 168 lbs.  . Chronic lower back pain   . COPD (chronic obstructive pulmonary disease) ([place])   . DDD (degenerative disc disease)    lumbar and cervical.   . Depression   . Diverticular disease   . Fibromyalgia    Patient states dx was around her late 19s but she had sx's for years prior to this.  . H/O hiatal hernia   . Hay fever   . History of pneumonia    hospitalized 12/2011, 02/2013, and 07/2013 [facility]) for this  . HTN (hypertension)   . Idiopathic  angio-edema-urticaria 72014   Angioedema component was very minimal  . Insomnia   . Iron deficiency anemia    Hematologist in [place], [place] did extensive w/u; no cause found; failed oral supplement;; gets fairly regular (q79m or so) IV iron infusions (Venofer -iron sucrose- 200mg  with procrit.  "for 14 yr I've been getting blood work q month & getting infusions prn" (07/12/2013).  Dr. Marin Olp locally, iron infusions done, EPO deficiency dx'd  . Migraine syndrome    "not as often anymore; used to be ~ q wk" (07/12/2013)  . Mixed incontinence urge and stress   . Nephrolithiasis    "passed all on my own or they are still in there" (07/12/2013)  . OSA on CPAP    prior to move to [place]--had another sleep study 10/2015 w/pulm Dr. Camillo Flaming.  . Osteoarthritis    "severe; progressing fast" (07/12/2013); multiple joints-not surgical candidate for TKR (03/2015)  . Pernicious anemia 08/24/2014  . Pleural plaque with presence of asbestos 07/22/2013  . Pulmonary embolism ([place]) 07/2013   Dx at [facility] with very small peripheral upper lobe pe 07/2013: pt took coumadin for about 8-9 mo  . Pyelonephritis    "several times over the last 30 yr" (07/12/2013)  . RBBB (right bundle branch block)   . Recurrent  UTI    hx of hospitalization for pyelonephritis; started abx prophylaxis 06/2015  . Syncope    Hypotensive; ED visit--Dr. Terrence Dupont did Cath--nonobstructive CAD, EF 55-60%.  In retrospect, suspect pt rx med misuse/polypharmacy  . Tension headache, chronic     Patient Active Problem List   Diagnosis Date Noted  . Dehydration 12/04/2016  . Low serum erythropoietin level 10/17/2016  . RBBB 09/23/2016  . Primary osteoarthritis of left knee 06/19/2016  . Debilitated patient 06/06/2016  . Acute on chronic diastolic CHF (congestive heart failure), NYHA class 3 ([place]) 05/30/2016  . SOB (shortness of breath)   . [name] ([place])   . Rotator cuff syndrome of right shoulder 01/10/2016  . UTI (urinary tract infection) 01/01/2016  .  Encephalopathy, metabolic 99991111  . CAP (community acquired pneumonia) 01/01/2016  . Fall at home 01/01/2016  . Rhabdomyolysis 01/01/2016  . Diastolic heart failure ([place]) 10/16/2015  . CAD-minor 2014 08/16/2015  . Chest pain with moderate risk for cardiac etiology 08/16/2015  . Narrowing of intervertebral disc space 07/17/2015  . Bilateral lower leg pain 01/24/2015  . 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 Chronic pain syndrome 11/21/2014  . Anxiety and depression 11/21/2014  . Abnormal grief reaction 11/21/2014  . Severe persistent asthma 11/21/2014  . Hypokalemia 11/21/2014  . Hyperlipidemia 11/21/2014  . Osteoarthritis of right knee 10/18/2014  . Pernicious anemia 08/24/2014  . Generalized anxiety disorder--with occasional panic attacks.  08/05/2014  . Recurrent major depression-severe ([place]) 08/05/2014  . Multifactorial gait disorder 07/26/2014  . Chronic respiratory failure ([place]) 07/23/2014  . Epistaxis 07/18/2014  . Acute GI bleeding 07/17/2014  . Anemia associated with acute blood loss 07/17/2014  . Syncope 07/17/2014  . History of pulmonary embolism 07/17/2014  . GI bleed 07/17/2014  . Arthritis 05/11/2014  . DDD (degenerative disc disease) 05/11/2014  . Fibrositis 05/11/2014  . Amianthosis ([place]) 05/11/2014  . Grief 04/28/2014  . OSA (obstructive sleep apnea) 04/24/2014  . Chronic diastolic CHF, NYHA class 1 04/24/2014  . Acute pulmonary embolism ([place]) 08/13/2013  . Pleural plaque with presence of asbestos 07/22/2013  . Polypharmacy 04/26/2013  . Fibromyalgia syndrome 03/01/2013  . Insomnia 11/12/2012  . HTN (hypertension), benign 10/25/2012    Past Surgical History:  Procedure Laterality Date  . APPENDECTOMY  1960  . AXILLARY SURGERY Left 1978   Multiple "lump" in armpit per pt  . CARDIAC CATHETERIZATION  01/2013   nonobstructive CAD, EF 55-60%  . CARDIOVASCULAR STRESS TEST  02/22/15   Low risk myocard perf imaging; wall motion normal, normal EF  .  COCCYX REMOVAL  1972  . DILATION AND CURETTAGE OF UTERUS  ? 1970's  . ESOPHAGOGASTRODUODENOSCOPY N/A 07/19/2014   Gastritis found + in the setting of supratherapeutic INR, +plavix, + meloxicam.  . EYE SURGERY Left 2012-2013   "injections for ~ 1 yr; don't really know what for" (07/12/2013)  . HEEL SPUR SURGERY Left 2008  . KNEE SURGERY  2005  . LEFT HEART CATHETERIZATION WITH CORONARY ANGIOGRAM N/A 01/30/2013   Procedure: LEFT HEART CATHETERIZATION WITH CORONARY ANGIOGRAM;  Surgeon: Clent Demark, MD;  Location: [facility] CATH LAB;  Service: Cardiovascular;  Laterality: N/A;  . PLANTAR FASCIA RELEASE Left 2008  . SPIROMETRY  04/25/14   In hosp for acute asthma/COPD flare: mixed obstructive and restrictive lung disease. The FEV1 is severely reduced at 45% predicted.  FEV1 signif decreased compared to prior spirometry 07/23/13.  . TENDON RELEASE  1996   Right forearm and hand  . TOTAL  ABDOMINAL HYSTERECTOMY  1974  . TRANSTHORACIC ECHOCARDIOGRAM  01/2013; 04/2014;08/2015   2014--NORMAL.  2015--focal basal septal hypertrophy, EF 55-60%, grade I diast dysfxn, mild LAE.  08/2015 EF 55-60%, nl LV syst fxn, grade I DD, valves wnl    OB History    No data available       Home Medications    Prior to Admission medications   Medication Sig Start Date End Date Taking? Authorizing Provider  acetaminophen (TYLENOL) 325 MG tablet Take 2 tablets (650 mg total) by mouth every 4 (four) hours as needed for headache or mild pain. 06/06/16  Yes Luke K Kilroy, PA-C  albuterol (PROVENTIL HFA;VENTOLIN HFA) 108 (90 BASE) MCG/ACT inhaler Inhale 2 puffs into the lungs every 6 (six) hours as needed for wheezing or shortness of breath. 09/15/14  Yes Tammi Sou, MD  ALPRAZolam (XANAX) 1 MG tablet TAKE 1 TABLET THREE TIMES DAILY AS NEEDED FOR ANXIETY. 10/17/16  Yes Tammi Sou, MD  ARIPiprazole (ABILIFY) 10 MG tablet TAKE 1 TABLET ONCE DAILY. 10/17/16  Yes Tammi Sou, MD  aspirin 81 MG tablet Take 81 mg by mouth at  bedtime.   Yes Historical Provider, MD  atorvastatin (LIPITOR) 20 MG tablet Take 20 mg by mouth at bedtime.    Yes Historical Provider, MD  budesonide-formoterol (SYMBICORT) 160-4.5 MCG/ACT inhaler Inhale 2 puffs into the lungs 2 (two) times daily. 10/06/14  Yes Irene Pap, NP  Cholecalciferol (VITAMIN D3) 50000 units CAPS Take 1 capsule by mouth every Monday.    Yes Historical Provider, MD  diclofenac sodium (VOLTAREN) 1 % GEL Apply 2 g topically 3 (three) times daily as needed (pain). 11/19/16  Yes Bayard Hugger, NP  DULoxetine (CYMBALTA) 30 MG capsule TAKE 1 CAPSULE ONCE DAILY ALONG WITH 60MG  CAPSULE FOR TOTAL 90MG  DAILY. Patient taking differently: TAKE 30MG  BY MOUTH ONCE DAILY, ALONG WITH 60MG  CAPSULE FOR TOTAL 90MG  DAILY. 10/17/16  Yes Tammi Sou, MD  DULoxetine (CYMBALTA) 60 MG capsule Take 60 mg by mouth at bedtime. TO BE COMBINED WITH 30 MG TAB TO EQUAL 90 MG    Yes Historical Provider, MD  folic acid (FOLVITE) 1 MG tablet Take 2 tablets (2 mg total) by mouth daily. Patient taking differently: Take 1 mg by mouth daily.  09/15/14  Yes Tammi Sou, MD  furosemide (LASIX) 40 MG tablet Take 1-2 tablets (40-80 mg total) by mouth daily. 10/11/16  Yes Rhonda G Barrett, PA-C  isosorbide mononitrate (IMDUR) 30 MG 24 hr tablet Take 30 mg by mouth daily.   Yes Historical Provider, MD  LYRICA 300 MG capsule TAKE (1) CAPSULE TWICE DAILY. Patient taking differently: TAKE 300MG  DAILY. 11/01/16  Yes Volanda Napoleon, MD  metoprolol succinate (TOPROL XL) 25 MG 24 hr tablet Take 1 tablet (25 mg total) by mouth 2 (two) times daily. 11/07/16  Yes Rhonda G Barrett, PA-C  Multiple Vitamin (MULTIVITAMIN WITH MINERALS) TABS tablet Take 1 tablet by mouth daily.   Yes Historical Provider, MD  nitroGLYCERIN (NITROSTAT) 0.4 MG SL tablet Place 0.4 mg under the tongue every 5 (five) minutes as needed for chest pain (x 3 doses).    Yes Historical Provider, MD  Oxycodone HCl 10 MG TABS Take 1 tablet (10 mg  total) by mouth every 6 (six) hours. 11/19/16  Yes Bayard Hugger, NP  OXYGEN Inhale 3 L into the lungs continuous.   Yes Historical Provider, MD  pantoprazole (PROTONIX) 40 MG tablet Take 40 mg by mouth daily.  Yes Historical Provider, MD  polyethylene glycol (MIRALAX / GLYCOLAX) packet Take 17 g by mouth daily as needed (constipation). Mix in 8 oz liquid and drink   Yes Historical Provider, MD  potassium chloride SA (K-DUR,KLOR-CON) 20 MEQ tablet Take 2-4 tablets (40-80 mEq total) by mouth daily. 10/11/16  Yes Rhonda G Barrett, PA-C  traZODone (DESYREL) 50 MG tablet TAKE 2-4 TABLETS AT BEDTIME 10/18/16  Yes Tammi Sou, MD    Family History Family History  Problem Relation Age of Onset  . Arthritis Mother   . Kidney disease Mother   . Heart disease Father   . Stroke Father   . Hypertension Father   . Diabetes Father   . Heart attack Father   . Heart attack Paternal Grandmother     Social History Social History  Substance Use Topics  . Smoking status: Never Smoker  . Smokeless tobacco: Never Used     Comment: never used tobacco  . Alcohol use No     Allergies   Penicillins   Review of Systems Review of Systems  All other systems reviewed and are negative.    Physical Exam Updated Vital Signs BP 185/89 (BP Location: Right Arm)   Pulse 82   Temp 97.7 F (36.5 C) (Oral)   Resp 22   SpO2 98%   Physical Exam  Constitutional: She appears well-developed and well-nourished. No distress.  HENT:  Head: Normocephalic and atraumatic.  Mouth/Throat: Mucous membranes are dry.  Neck:  In c-collar   Cardiovascular: Normal rate and regular rhythm.   Pulmonary/Chest: Effort normal and breath sounds normal. No respiratory distress. She has no wheezes. She has no rales.  Abdominal: Soft. She exhibits no distension. There is no tenderness. There is no rebound and no guarding.  Neurological: She is alert.  Skin: She is not diaphoretic.  Nursing note and vitals  reviewed.    ED Treatments / Results  Labs (all labs ordered are listed, but only abnormal results are displayed) Labs Reviewed  COMPREHENSIVE METABOLIC PANEL - Abnormal; Notable for the following:       Result Value   Potassium 2.6 (*)    Chloride 95 (*)    Glucose, Bld 126 (*)    Total Protein 9.1 (*)    Albumin 5.2 (*)    Total Bilirubin 1.3 (*)    All other components within normal limits  CBC WITH DIFFERENTIAL/PLATELET - Abnormal; Notable for the following:    WBC 13.5 (*)    RBC 5.33 (*)    Hemoglobin 16.1 (*)    HCT 47.0 (*)    Neutro Abs 11.8 (*)    All other components within normal limits  CK - Abnormal; Notable for the following:    Total CK 763 (*)    All other components within normal limits  URINALYSIS, ROUTINE W REFLEX MICROSCOPIC - Abnormal; Notable for the following:    Hgb urine dipstick SMALL (*)    Ketones, ur 5 (*)    Bacteria, UA RARE (*)    Squamous Epithelial / LPF 0-5 (*)    All other components within normal limits  URINE CULTURE  I-STAT TROPOININ, ED  I-STAT CG4 LACTIC ACID, ED    EKG  EKG Interpretation  Date/Time:  Wednesday December 04 2016 11:45:12 EST Ventricular Rate:  84 PR Interval:    QRS Duration: 158 QT Interval:  436 QTC Calculation: 516 R Axis:   6 Text Interpretation:  Sinus rhythm Probable left atrial enlargement Right bundle branch  block Borderline ST depression, lateral leads Baseline wander in lead(s) V5 No significant change since last tracing Confirmed by [facility] MD, [place] ([id number]) on 12/04/2016 1:06:33 PM       Radiology Dg Chest 2 View  Result Date: 12/04/2016 CLINICAL DATA:  Back pain after fall. EXAM: CHEST  2 VIEW COMPARISON:  Radiographs of May 31, 2016. FINDINGS: Stable cardiomediastinal silhouette. Atherosclerosis of thoracic aorta is noted. Stable calcified bilateral pleural plaques are noted. Both lungs are clear. The visualized skeletal structures are unremarkable. IMPRESSION: Aortic atherosclerosis.  Stable calcified pleural plaques. No acute cardiopulmonary abnormality seen. Electronically Signed   By: Marijo Conception, M.D.   On: 12/04/2016 13:34   Dg Thoracic Spine 2 View  Result Date: 12/04/2016 CLINICAL DATA:  Upper back pain after fall. EXAM: THORACIC SPINE 2 VIEWS COMPARISON:  CT scan of January 01, 2016. FINDINGS: There is no evidence of thoracic spine fracture. Alignment is normal. No other significant bone abnormalities are identified. IMPRESSION: No significant abnormality seen in the thoracic spine. Electronically Signed   By: Marijo Conception, M.D.   On: 12/04/2016 13:38   Dg Lumbar Spine Complete  Result Date: 12/04/2016 CLINICAL DATA:  Low back pain after fall. EXAM: LUMBAR SPINE - COMPLETE 4+ VIEW COMPARISON:  Radiographs of September 13, 2015. FINDINGS: No fracture is noted. Stable grade 1 anterolisthesis of L5-S1 is noted secondary to posterior facet joint hypertrophy. Mild degenerative disc disease is noted at L3-4. Remaining disc spaces appear intact. IMPRESSION: Mild degenerative disc disease is noted L3-4. No acute abnormality seen in the lumbar spine. Electronically Signed   By: Marijo Conception, M.D.   On: 12/04/2016 13:36   Ct Head Wo Contrast  Result Date: 12/04/2016 CLINICAL DATA:  Dizziness, back pain, fall EXAM: CT HEAD WITHOUT CONTRAST CT CERVICAL SPINE WITHOUT CONTRAST TECHNIQUE: Multidetector CT imaging of the head and cervical spine was performed following the standard protocol without intravenous contrast. Multiplanar CT image reconstructions of the cervical spine were also generated. COMPARISON:  01/01/2016 FINDINGS: CT HEAD FINDINGS Brain: No intracranial hemorrhage, mass effect or midline shift. Stable mild cerebral atrophy. No acute cortical infarction. Ventricular size is stable from prior exam. No mass lesion is noted on this unenhanced scan. Vascular: No hyperdense vessel or unexpected calcification. Skull: No skull fracture is noted. Sinuses/Orbits: Paranasal  sinuses are unremarkable. No intraorbital hematoma. A Other: None CT CERVICAL SPINE FINDINGS Alignment: There is normal alignment. Skull base and vertebrae: Diffuse osteopenia is noted. Degenerative changes are noted C1-C2 articulation. Mild anterior and mild posterior spurring at C5-C6 and C6-C7 level. Soft tissues and spinal canal: No prevertebral soft tissue swelling. Spinal canal is patent. Disc levels: There is disc space flattening at C5-C6 and C6-C7 level. Upper chest: No pneumothorax in visualized lung apices. There is pleuroparenchymal scarring in right apex medially Other: Atherosclerotic calcifications are noted bilateral carotid bifurcation IMPRESSION: 1. No acute intracranial abnormality.  Stable cerebral atrophy. 2. No cervical spine acute fracture or subluxation. Degenerative changes as described above. Electronically Signed   By: Lahoma Crocker M.D.   On: 12/04/2016 13:14   Ct Cervical Spine Wo Contrast  Result Date: 12/04/2016 CLINICAL DATA:  Dizziness, back pain, fall EXAM: CT HEAD WITHOUT CONTRAST CT CERVICAL SPINE WITHOUT CONTRAST TECHNIQUE: Multidetector CT imaging of the head and cervical spine was performed following the standard protocol without intravenous contrast. Multiplanar CT image reconstructions of the cervical spine were also generated. COMPARISON:  01/01/2016 FINDINGS: CT HEAD FINDINGS Brain: No intracranial hemorrhage, mass effect  or midline shift. Stable mild cerebral atrophy. No acute cortical infarction. Ventricular size is stable from prior exam. No mass lesion is noted on this unenhanced scan. Vascular: No hyperdense vessel or unexpected calcification. Skull: No skull fracture is noted. Sinuses/Orbits: Paranasal sinuses are unremarkable. No intraorbital hematoma. A Other: None CT CERVICAL SPINE FINDINGS Alignment: There is normal alignment. Skull base and vertebrae: Diffuse osteopenia is noted. Degenerative changes are noted C1-C2 articulation. Mild anterior and mild  posterior spurring at C5-C6 and C6-C7 level. Soft tissues and spinal canal: No prevertebral soft tissue swelling. Spinal canal is patent. Disc levels: There is disc space flattening at C5-C6 and C6-C7 level. Upper chest: No pneumothorax in visualized lung apices. There is pleuroparenchymal scarring in right apex medially Other: Atherosclerotic calcifications are noted bilateral carotid bifurcation IMPRESSION: 1. No acute intracranial abnormality.  Stable cerebral atrophy. 2. No cervical spine acute fracture or subluxation. Degenerative changes as described above. Electronically Signed   By: Lahoma Crocker M.D.   On: 12/04/2016 13:14   Dg Hips Bilat W Or Wo Pelvis 5 Views  Result Date: 12/04/2016 CLINICAL DATA:  Bilateral hip pain after fall. EXAM: DG HIP (WITH OR WITHOUT PELVIS) 5+V BILAT COMPARISON:  None. FINDINGS: There is no evidence of hip fracture or dislocation. There is no evidence of arthropathy or other focal bone abnormality. IMPRESSION: Normal bilateral hips. Electronically Signed   By: Marijo Conception, M.D.   On: 12/04/2016 13:40    Procedures Procedures (including critical care time)  Medications Ordered in ED Medications  0.9 %  sodium chloride infusion (not administered)  potassium chloride SA (K-DUR,KLOR-CON) CR tablet 40 mEq (not administered)  ondansetron (ZOFRAN) injection 4 mg (4 mg Intravenous Given 12/04/16 1442)  sodium chloride 0.9 % bolus 1,000 mL (0 mLs Intravenous Stopped 12/04/16 1443)  fentaNYL (SUBLIMAZE) injection 50 mcg (50 mcg Intravenous Given 12/04/16 1442)     Initial Impression / Assessment and Plan / ED Course  I have reviewed the triage vital signs and the nursing notes.  Pertinent labs & imaging results that were available during my care of the patient were reviewed by me and considered in my medical decision making (see chart for details).  Clinical Course    Afebrile nontoxic patient who appears very dry and dehydrated, presents after getting dizzy  and falling last night, getting stuck on the floor x 13 hours.  Workup significant for hypokalemia, elevated CK, leukocytosis. Admitted to [facility], Dr Teryl Lucy accepting.   Final Clinical Impressions(s) / ED Diagnoses   Final diagnoses:  Dehydration  Hypokalemia  Non-traumatic rhabdomyolysis    New Prescriptions New Prescriptions   No medications on file     Clayton Bibles, PA-C 12/04/16 [place], MD 12/08/16 1234

## 2016-12-05 ENCOUNTER — Ambulatory Visit: Payer: Medicare Other | Admitting: Physician Assistant

## 2016-12-05 DIAGNOSIS — Z86711 Personal history of pulmonary embolism: Secondary | ICD-10-CM | POA: Diagnosis not present

## 2016-12-05 DIAGNOSIS — E876 Hypokalemia: Secondary | ICD-10-CM

## 2016-12-05 DIAGNOSIS — I2510 Atherosclerotic heart disease of native coronary artery without angina pectoris: Secondary | ICD-10-CM | POA: Diagnosis present

## 2016-12-05 DIAGNOSIS — J449 Chronic obstructive pulmonary disease, unspecified: Secondary | ICD-10-CM | POA: Diagnosis present

## 2016-12-05 DIAGNOSIS — Z7982 Long term (current) use of aspirin: Secondary | ICD-10-CM | POA: Diagnosis not present

## 2016-12-05 DIAGNOSIS — J61 Pneumoconiosis due to asbestos and other mineral fibers: Secondary | ICD-10-CM | POA: Diagnosis present

## 2016-12-05 DIAGNOSIS — Z79899 Other long term (current) drug therapy: Secondary | ICD-10-CM | POA: Diagnosis not present

## 2016-12-05 DIAGNOSIS — Z7951 Long term (current) use of inhaled steroids: Secondary | ICD-10-CM | POA: Diagnosis not present

## 2016-12-05 DIAGNOSIS — H8110 Benign paroxysmal vertigo, unspecified ear: Secondary | ICD-10-CM | POA: Diagnosis present

## 2016-12-05 DIAGNOSIS — F418 Other specified anxiety disorders: Secondary | ICD-10-CM

## 2016-12-05 DIAGNOSIS — J9611 Chronic respiratory failure with hypoxia: Secondary | ICD-10-CM | POA: Diagnosis present

## 2016-12-05 DIAGNOSIS — I110 Hypertensive heart disease with heart failure: Secondary | ICD-10-CM | POA: Diagnosis present

## 2016-12-05 DIAGNOSIS — R531 Weakness: Secondary | ICD-10-CM | POA: Diagnosis not present

## 2016-12-05 DIAGNOSIS — Z66 Do not resuscitate: Secondary | ICD-10-CM | POA: Diagnosis present

## 2016-12-05 DIAGNOSIS — M6282 Rhabdomyolysis: Secondary | ICD-10-CM | POA: Diagnosis not present

## 2016-12-05 DIAGNOSIS — Z9981 Dependence on supplemental oxygen: Secondary | ICD-10-CM | POA: Diagnosis not present

## 2016-12-05 DIAGNOSIS — F419 Anxiety disorder, unspecified: Secondary | ICD-10-CM | POA: Diagnosis present

## 2016-12-05 DIAGNOSIS — I5032 Chronic diastolic (congestive) heart failure: Secondary | ICD-10-CM | POA: Diagnosis present

## 2016-12-05 DIAGNOSIS — E860 Dehydration: Secondary | ICD-10-CM | POA: Diagnosis not present

## 2016-12-05 DIAGNOSIS — F329 Major depressive disorder, single episode, unspecified: Secondary | ICD-10-CM | POA: Diagnosis present

## 2016-12-05 DIAGNOSIS — M797 Fibromyalgia: Secondary | ICD-10-CM | POA: Diagnosis present

## 2016-12-05 LAB — COMPREHENSIVE METABOLIC PANEL
ALK PHOS: 71 U/L (ref 38–126)
ALT: 17 U/L (ref 14–54)
ANION GAP: 7 (ref 5–15)
AST: 25 U/L (ref 15–41)
Albumin: 3.5 g/dL (ref 3.5–5.0)
BILIRUBIN TOTAL: 0.8 mg/dL (ref 0.3–1.2)
BUN: 7 mg/dL (ref 6–20)
CALCIUM: 8.3 mg/dL — AB (ref 8.9–10.3)
CO2: 30 mmol/L (ref 22–32)
Chloride: 104 mmol/L (ref 101–111)
Creatinine, Ser: 0.57 mg/dL (ref 0.44–1.00)
GFR calc Af Amer: >60 mL/min (ref >60)
GFR calc non Af Amer: >60 mL/min (ref >60)
Glucose, Bld: 112 mg/dL — ABNORMAL HIGH (ref 65–99)
POTASSIUM: 2.9 mmol/L — AB (ref 3.5–5.1)
Sodium: 141 mmol/L (ref 135–145)
Total Protein: 6.5 g/dL (ref 6.5–8.1)

## 2016-12-05 LAB — CBC
HEMATOCRIT: 39.5 % (ref 36.0–46.0)
HEMOGLOBIN: 12.9 g/dL (ref 12.0–15.0)
MCH: 29 pg (ref 26.0–34.0)
MCHC: 32.7 g/dL (ref 30.0–36.0)
MCV: 88.8 fL (ref 78.0–100.0)
Platelets: 192 10*3/uL (ref 150–400)
RBC: 4.45 MIL/uL (ref 3.87–5.11)
RDW: 13.1 % (ref 11.5–15.5)
WBC: 10.1 10*3/uL (ref 4.0–10.5)

## 2016-12-05 LAB — MAGNESIUM: MAGNESIUM: 1.7 mg/dL (ref 1.7–2.4)

## 2016-12-05 LAB — CK: CK TOTAL: 724 U/L — AB (ref 38–234)

## 2016-12-05 MED ORDER — POTASSIUM CHLORIDE CRYS ER 20 MEQ PO TBCR
40.0000 meq | EXTENDED_RELEASE_TABLET | ORAL | Status: AC
  Administered 2016-12-05 (×2): 40 meq via ORAL
  Filled 2016-12-05 (×2): qty 2

## 2016-12-05 MED ORDER — MAGNESIUM SULFATE 2 GM/50ML IV SOLN
2.0000 g | Freq: Once | INTRAVENOUS | Status: AC
  Administered 2016-12-05: 2 g via INTRAVENOUS
  Filled 2016-12-05: qty 50

## 2016-12-05 MED ORDER — PREGABALIN 50 MG PO CAPS
100.0000 mg | ORAL_CAPSULE | Freq: Two times a day (BID) | ORAL | Status: DC
  Administered 2016-12-05 – 2016-12-06 (×2): 100 mg via ORAL
  Filled 2016-12-05 (×2): qty 2

## 2016-12-05 MED ORDER — DULOXETINE HCL 60 MG PO CPEP
60.0000 mg | ORAL_CAPSULE | Freq: Every day | ORAL | Status: DC
  Administered 2016-12-06: 60 mg via ORAL
  Filled 2016-12-05: qty 1

## 2016-12-05 MED ORDER — IBUPROFEN 200 MG PO TABS
400.0000 mg | ORAL_TABLET | Freq: Four times a day (QID) | ORAL | Status: DC | PRN
Start: 2016-12-05 — End: 2016-12-06

## 2016-12-05 NOTE — Evaluation (Signed)
Physical Therapy Evaluation Patient Details Name: Linda [name] MRN: [id number] DOB: Apr 15, 1946 Today's Date: 12/05/2016   History of Present Illness  70 y.o. female with a history of BPPV, anxiety, depression, asbestosis, COPD on Oxygen 3 lpm, HTN, ?orthostatic syncope, D-CHF, mod CAD by cath 2014 w/ med rx, fibromyalgia w/ chronic pain issues.  Pt [facility] at [facility] and was on the floor for 14 hours, rhabdomyolysis  Clinical Impression  Pt admitted with above diagnosis. Pt currently with functional limitations due to the deficits listed below (see PT Problem List).  Pt will benefit from skilled PT to increase their independence and safety with mobility to allow discharge to the venue listed below.   Pt's mobility is slow and with stiff movement at this time.  Pt assisted up to recliner and requiring mod assist.  Would recommend SNF upon d/c.  Pt states she plans to d/c back to [facility], however uncertain they could provide her current level of assist.     Follow Up Recommendations SNF    Equipment Recommendations  None recommended by PT    Recommendations for Other Services       Precautions / Restrictions Precautions Precautions: Fall Precaution Comments: 3L O2 at baseline      Mobility  Bed Mobility Overal bed mobility: Needs Assistance Bed Mobility: Supine to Sit     Supine to sit: Min assist     General bed mobility comments: increased time and effort, assist for trunk upright  Transfers Overall transfer level: Needs assistance Equipment used: Rolling walker (2 wheeled) Transfers: Sit to/from [facility] Sit to Stand: Mod assist Stand pivot transfers: Min assist       General transfer comment: verbal cues for safe technique, orthostatics obtained by RN, pt reports increased back pain  Ambulation/Gait                Stairs            Wheelchair Mobility    Modified Rankin (Stroke Patients Only)       Balance Overall  balance assessment: History of Falls;Needs assistance         Standing balance support: Bilateral upper extremity supported;During functional activity Standing balance-Leahy Scale: Poor                               Pertinent Vitals/Pain Pain Assessment: Faces Faces Pain Scale: Hurts little more Pain Location: entire back Pain Descriptors / Indicators: Sore Pain Intervention(s): Limited activity within patient's tolerance;Monitored during session;Repositioned;Patient requesting pain meds-RN notified    Home Living       Type of Home: Independent living facility         Home Equipment: Kasandra Knudsen - single point;Shower seat;Grab bars - toilet;Grab bars - tub/shower;Hand held shower head;Walker - 4 wheels;Other (comment);Wheelchair - Brewing technologist - built in Additional Comments: Uses rollator for ambulation; walks down long hall to elevator, down to first floor and down another hallway to get to dining room. States she only does this for dinner usually    Prior Function Level of Independence: Needs assistance   Gait / Transfers Assistance Needed: using rollator   ADL's / Homemaking Assistance Needed: modified independent (bathes @sink  or in shower); wears depends for incontinence; reported takes her 10 minutes to reach/get her feet into her underwear or pants  Comments: above per previous admission     Hand Dominance        Extremity/Trunk  Assessment   Upper Extremity Assessment Upper Extremity Assessment: Generalized weakness    Lower Extremity Assessment Lower Extremity Assessment: Generalized weakness    Cervical / Trunk Assessment Cervical / Trunk Assessment: Kyphotic;Other exceptions Cervical / Trunk Exceptions: forward head/neck posture   Communication   Communication: No difficulties (very slow speech, ?slow processing)  Cognition Arousal/Alertness: Awake/alert Behavior During Therapy: Flat affect Overall Cognitive Status: Difficult to  assess                 General Comments: pt requiring increased time to answer questions, ?slow processing, slow speech, uncertain of baseline    General Comments      Exercises     Assessment/Plan    PT Assessment Patient needs continued PT services  PT Problem List Decreased strength;Decreased activity tolerance;Decreased mobility;Decreased coordination;Decreased knowledge of use of DME;Pain          PT Treatment Interventions Gait training;DME instruction;Therapeutic activities;Therapeutic exercise;Patient/family education;Functional mobility training    PT Goals (Current goals can be found in the Care Plan section)  Acute Rehab PT Goals PT Goal Formulation: With patient Time For Goal Achievement: 12/12/16 Potential to Achieve Goals: Good    Frequency Min 3X/week   Barriers to discharge        Co-evaluation               End of Session Equipment Utilized During Treatment: Gait belt Activity Tolerance: Patient limited by pain Patient left: in chair;with call bell/phone within reach Nurse Communication: Mobility status (RN present for transfer and obtained orthostatics)         Time: [id number] PT Time Calculation (min) (ACUTE ONLY): 23 min   Charges:   PT Evaluation $PT Eval Moderate Complexity: 1 Procedure     PT G Codes:        Emmalyn Hinson,KATHrine E 12/05/2016, 12:35 PM Carmelia Bake, PT, DPT 12/05/2016 Pager: [phone]

## 2016-12-05 NOTE — Progress Notes (Signed)
Admission Summary indicates pt is from [facility] and has been admitted under observation status. PT eval is pending. Pt has [employer] which will not cover SNF placement, if required, due to observation status. CSW will remain available to assist with pvt pay SNF placement. If needed.  Werner Lean LCSW [phone]

## 2016-12-05 NOTE — Clinical Social Work Note (Signed)
Clinical Social Work Assessment  Patient Details  Name: Linda [name] MRN: [id number] Date of Birth: 06-11-1946  Date of referral:  12/05/16               Reason for consult:  Discharge Planning                Permission sought to share information with:    Permission granted to share information::     Name::        Agency::     Relationship::     Contact Information:     Housing/Transportation Living arrangements for the past 2 months:  [place] of Information:  Patient Patient Interpreter Needed:  None Criminal Activity/Legal Involvement Pertinent to Current Situation/Hospitalization:    Significant Relationships:  Adult Children Lives with:  Self Do you feel safe going back to the place where you live?  Yes Need for family participation in patient care:  No (Coment)  Care giving concerns:  Pt lacks support at home.   Social Worker assessment / plan: Pt hospitalized on 12/13 under observation from [facility]. PT has recommended SNF at d/c. CSW met with pt to assist wit d/c planning. CSW explained to pt that [place] has been recommended but due to her observation status medicare will not cover cost of rehab. CSW offered to assist with pvt pay SNF placement. Pt has denied SNF placement at this time. Pt reports her denial is not due to cost of SNF but that she just wants to return home. CSW requested to call her son to see if he may be able to assist with her care at home. Pt declined allowing CSW to call her son. " My son has MS and isn't able to help me. " Pt reports that she may have a person that she can hire to assist her at home. CSW encouraged pt to contact caregiver to ask for assistance prior to d/c. CSW will remain available to assist with SNF placement in case pt changes her mind and will reconsider placement.  Employment status:  Retired Forensic scientist:  Medicare PT Recommendations:  [place] / Referral to community resources:     Patient/Family's Response to care:  No family at bedside. Pt as declined SNF placement.  Patient/Family's Understanding of and Emotional Response to Diagnosis, Current Treatment, and Prognosis:  Pt is aware of her medical status. " I hurt when I move but I feel better than I did yesterday. "   Emotional Assessment Appearance:  Appears stated age Attitude/Demeanor/Rapport:   (cooperative) Affect (typically observed):  Calm, Appropriate Orientation:  Oriented to Self, Oriented to Place, Oriented to  Time, Oriented to Situation Alcohol / Substance use:  Not Applicable Psych involvement (Current and /or in the community):  No (Comment)  Discharge Needs  Concerns to be addressed:  Discharge Planning Concerns Readmission within the last 30 days:  No Current discharge risk:  Lives alone, Lack of support system, Physical Impairment Barriers to Discharge:  No Barriers Identified   Loraine Maple [phone] 12/05/2016, 3:31 PM

## 2016-12-05 NOTE — Progress Notes (Signed)
PROGRESS NOTE                                                                                                                                                                                                             Patient Demographics:    Linda [name], is a 70 y.o. female, DOB - 18-Jun-1946, [id number]  Admit date - 12/04/2016   Admitting Physician Mariel Aloe, MD  Outpatient Primary MD for the patient is Tammi Sou, MD  LOS - 0  Outpatient Specialists:None  Chief Complaint  Patient presents with  . Fall  . Neck Pain  . Back Pain       Brief Narrative   70 year old female with BPPV, anxiety and depression, history of fibromyalgia, diastolic CHF asbestosis and hypertension resistant of independent living felt lightheaded and dizzy and fell at her residence. She was stuck between 2 chairs for almost 14 hours and could not get help all night. She denies loss of consciousness, bowel or urinary incontinence. In the ED vitals were stable except for mild hypertension labs showed CK of 763, WBC of 13.5 K and potassium of 2.6. Chest x-ray unremarkable. X-ray of the cervical and lumbar spine showed degenerative changes.    Subjective:   Patient complains of upper back pain.   Assessment  & Plan :    Principal Problem:  Acute Rhabdomyolysis Secondary to long lie syndrome. Continue IV hydration. CK slowly trending down. Replete low potassium and magnesium. hold Statin.   Active Problems: Fall with dizziness Likely combination of dehydration, BPPV and polypharmacy. No syncope. EKG unremarkable. Will reduce dose of trazodone and Lyrica. Reduce dose of Cymbalta (60 mg daily). Patient is also on oxycodone 10 mg every 6 hours as needed (which I think she takes on a scheduled basis). Reduce dose of Xanax to 0.5 mg 3 times a day. I will start her on NSAIDs and try to decrease her oxycodone  dose/frequency. Seen by PT and recommends SNF.   Chronic respiratory failure with hypoxia Underlying asbestosis. On 3 L O2 at home. Continue Dulera.  Chronic diastolic CHF Appears dehydrated on admission. Getting IV fluids.  Hypokalemia/hypomagnesemia Replenished  Fibromyalgia Reduce dose of Cymbalta. On oxycodone at home (which she probably takes scheduled.). Switch to when necessary and add NSAIDs.  Coronary artery disease Continue metoprolol and aspirin. Hold statin.  Anxiety/depression  Previous dose of Cymbalta and Xanax. Continue Abilify.  BPPV Not on any medications at home. Patient not dizzy at present.   Code Status : DO NOT RESUSCITATE  Family Communication  : None at bedside  Disposition Plan  : SNF possibly tomorrow  Barriers For Discharge : Improving symptoms  Consults  :  None   Procedures  : CT head and cervical spine  DVT Prophylaxis  :  Lovenox -   Lab Results  Component Value Date   PLT 192 12/05/2016    Antibiotics  :    Anti-infectives    None        Objective:   Vitals:   12/04/16 2158 12/05/16 0653 12/05/16 0714 12/05/16 0753  BP: (!) 164/71 (!) 151/60    Pulse: 95 81    Resp: 16 18    Temp: 98.4 F (36.9 C) 98 F (36.7 C)    TempSrc: Oral Oral    SpO2: 98% 99%  94%  Weight:   77.8 kg (171 lb 8.3 oz)     Wt Readings from Last 3 Encounters:  12/05/16 77.8 kg (171 lb 8.3 oz)  11/07/16 77.3 kg (170 lb 6.4 oz)  10/11/16 80.3 kg (177 lb)     Intake/Output Summary (Last 24 hours) at 12/05/16 1331 Last data filed at 12/05/16 1015  Gross per 24 hour  Intake             1200 ml  Output                0 ml  Net             1200 ml     Physical Exam  Gen: not in distress of flat affect HEENT: moist mucosa, supple neck Chest: clear b/l, no added sounds CVS: N S1&S2, no murmurs, rubs or gallop GI: soft, NT, ND, BS+ Musculoskeletal: warm, no edema CNS: Alert and oriented, has flat affect and communicate slowly.     Data Review:    CBC  Recent Labs Lab 12/04/16 1349 12/05/16 0401  WBC 13.5* 10.1  HGB 16.1* 12.9  HCT 47.0* 39.5  PLT 229 192  MCV 88.2 88.8  MCH 30.2 29.0  MCHC 34.3 32.7  RDW 12.7 13.1  LYMPHSABS 1.0  --   MONOABS 0.7  --   EOSABS 0.0  --   BASOSABS 0.0  --     Chemistries   Recent Labs Lab 12/04/16 1349 12/05/16 0401  NA 139 141  K 2.6* 2.9*  CL 95* 104  CO2 30 30  GLUCOSE 126* 112*  BUN 8 7  CREATININE 0.64 0.57  CALCIUM 9.9 8.3*  MG  --  1.7  AST 33 25  ALT 23 17  ALKPHOS 108 71  BILITOT 1.3* 0.8   ------------------------------------------------------------------------------------------------------------------ No results for input(s): CHOL, HDL, LDLCALC, TRIG, CHOLHDL, LDLDIRECT in the last 72 hours.  Lab Results  Component Value Date   HGBA1C 5.6 01/02/2016   ------------------------------------------------------------------------------------------------------------------ No results for input(s): TSH, T4TOTAL, T3FREE, THYROIDAB in the last 72 hours.  Invalid input(s): FREET3 ------------------------------------------------------------------------------------------------------------------ No results for input(s): VITAMINB12, FOLATE, FERRITIN, TIBC, IRON, RETICCTPCT in the last 72 hours.  Coagulation profile No results for input(s): INR, PROTIME in the last 168 hours.  No results for input(s): DDIMER in the last 72 hours.  Cardiac Enzymes No results for input(s): CKMB, TROPONINI, MYOGLOBIN in the last 168 hours.  Invalid input(s): CK ------------------------------------------------------------------------------------------------------------------    Component Value Date/Time   BNP 14.1 09/19/2016 1800  BNP 19.9 10/27/2015 1602    Inpatient Medications  Scheduled Meds: . ARIPiprazole  10 mg Oral Daily  . aspirin EC  81 mg Oral QHS  . atorvastatin  20 mg Oral QHS  . DULoxetine  90 mg Oral Daily  . enoxaparin (LOVENOX) injection  40  mg Subcutaneous Q24H  . folic acid  1 mg Oral Daily  . isosorbide mononitrate  30 mg Oral Daily  . metoprolol succinate  25 mg Oral BID  . mometasone-formoterol  2 puff Inhalation BID  . multivitamin with minerals  1 tablet Oral Daily  . pantoprazole  40 mg Oral Daily  . potassium chloride  40 mEq Oral Q4H  . pregabalin  150 mg Oral BID  . [START ON 12/09/2016] Vitamin D (Ergocalciferol)  50,000 Units Oral Q Mon   Continuous Infusions: . sodium chloride 75 mL/hr at 12/04/16 2259   PRN Meds:.albuterol, ALPRAZolam, diclofenac sodium, oxyCODONE, polyethylene glycol, traZODone  Micro Results No results found for this or any previous visit (from the past 240 hour(s)).  Radiology Reports Dg Chest 2 View  Result Date: 12/04/2016 CLINICAL DATA:  Back pain after fall. EXAM: CHEST  2 VIEW COMPARISON:  Radiographs of May 31, 2016. FINDINGS: Stable cardiomediastinal silhouette. Atherosclerosis of thoracic aorta is noted. Stable calcified bilateral pleural plaques are noted. Both lungs are clear. The visualized skeletal structures are unremarkable. IMPRESSION: Aortic atherosclerosis. Stable calcified pleural plaques. No acute cardiopulmonary abnormality seen. Electronically Signed   By: Marijo Conception, M.D.   On: 12/04/2016 13:34   Dg Thoracic Spine 2 View  Result Date: 12/04/2016 CLINICAL DATA:  Upper back pain after fall. EXAM: THORACIC SPINE 2 VIEWS COMPARISON:  CT scan of January 01, 2016. FINDINGS: There is no evidence of thoracic spine fracture. Alignment is normal. No other significant bone abnormalities are identified. IMPRESSION: No significant abnormality seen in the thoracic spine. Electronically Signed   By: Marijo Conception, M.D.   On: 12/04/2016 13:38   Dg Lumbar Spine Complete  Result Date: 12/04/2016 CLINICAL DATA:  Low back pain after fall. EXAM: LUMBAR SPINE - COMPLETE 4+ VIEW COMPARISON:  Radiographs of September 13, 2015. FINDINGS: No fracture is noted. Stable grade 1  anterolisthesis of L5-S1 is noted secondary to posterior facet joint hypertrophy. Mild degenerative disc disease is noted at L3-4. Remaining disc spaces appear intact. IMPRESSION: Mild degenerative disc disease is noted L3-4. No acute abnormality seen in the lumbar spine. Electronically Signed   By: Marijo Conception, M.D.   On: 12/04/2016 13:36   Ct Head Wo Contrast  Result Date: 12/04/2016 CLINICAL DATA:  Dizziness, back pain, fall EXAM: CT HEAD WITHOUT CONTRAST CT CERVICAL SPINE WITHOUT CONTRAST TECHNIQUE: Multidetector CT imaging of the head and cervical spine was performed following the standard protocol without intravenous contrast. Multiplanar CT image reconstructions of the cervical spine were also generated. COMPARISON:  01/01/2016 FINDINGS: CT HEAD FINDINGS Brain: No intracranial hemorrhage, mass effect or midline shift. Stable mild cerebral atrophy. No acute cortical infarction. Ventricular size is stable from prior exam. No mass lesion is noted on this unenhanced scan. Vascular: No hyperdense vessel or unexpected calcification. Skull: No skull fracture is noted. Sinuses/Orbits: Paranasal sinuses are unremarkable. No intraorbital hematoma. A Other: None CT CERVICAL SPINE FINDINGS Alignment: There is normal alignment. Skull base and vertebrae: Diffuse osteopenia is noted. Degenerative changes are noted C1-C2 articulation. Mild anterior and mild posterior spurring at C5-C6 and C6-C7 level. Soft tissues and spinal canal: No prevertebral soft tissue swelling. Spinal  canal is patent. Disc levels: There is disc space flattening at C5-C6 and C6-C7 level. Upper chest: No pneumothorax in visualized lung apices. There is pleuroparenchymal scarring in right apex medially Other: Atherosclerotic calcifications are noted bilateral carotid bifurcation IMPRESSION: 1. No acute intracranial abnormality.  Stable cerebral atrophy. 2. No cervical spine acute fracture or subluxation. Degenerative changes as described above.  Electronically Signed   By: Lahoma Crocker M.D.   On: 12/04/2016 13:14   Ct Cervical Spine Wo Contrast  Result Date: 12/04/2016 CLINICAL DATA:  Dizziness, back pain, fall EXAM: CT HEAD WITHOUT CONTRAST CT CERVICAL SPINE WITHOUT CONTRAST TECHNIQUE: Multidetector CT imaging of the head and cervical spine was performed following the standard protocol without intravenous contrast. Multiplanar CT image reconstructions of the cervical spine were also generated. COMPARISON:  01/01/2016 FINDINGS: CT HEAD FINDINGS Brain: No intracranial hemorrhage, mass effect or midline shift. Stable mild cerebral atrophy. No acute cortical infarction. Ventricular size is stable from prior exam. No mass lesion is noted on this unenhanced scan. Vascular: No hyperdense vessel or unexpected calcification. Skull: No skull fracture is noted. Sinuses/Orbits: Paranasal sinuses are unremarkable. No intraorbital hematoma. A Other: None CT CERVICAL SPINE FINDINGS Alignment: There is normal alignment. Skull base and vertebrae: Diffuse osteopenia is noted. Degenerative changes are noted C1-C2 articulation. Mild anterior and mild posterior spurring at C5-C6 and C6-C7 level. Soft tissues and spinal canal: No prevertebral soft tissue swelling. Spinal canal is patent. Disc levels: There is disc space flattening at C5-C6 and C6-C7 level. Upper chest: No pneumothorax in visualized lung apices. There is pleuroparenchymal scarring in right apex medially Other: Atherosclerotic calcifications are noted bilateral carotid bifurcation IMPRESSION: 1. No acute intracranial abnormality.  Stable cerebral atrophy. 2. No cervical spine acute fracture or subluxation. Degenerative changes as described above. Electronically Signed   By: Lahoma Crocker M.D.   On: 12/04/2016 13:14   Dg Hips Bilat W Or Wo Pelvis 5 Views  Result Date: 12/04/2016 CLINICAL DATA:  Bilateral hip pain after fall. EXAM: DG HIP (WITH OR WITHOUT PELVIS) 5+V BILAT COMPARISON:  None. FINDINGS: There is  no evidence of hip fracture or dislocation. There is no evidence of arthropathy or other focal bone abnormality. IMPRESSION: Normal bilateral hips. Electronically Signed   By: Marijo Conception, M.D.   On: 12/04/2016 13:40    Time Spent in minutes  25   Louellen Molder M.D on 12/05/2016 at 1:31 PM  Between 7am to 7pm - Pager - [phone]  After 7pm go to [email or web address] - password [facility]  [facility] -  Office  [phone]

## 2016-12-06 DIAGNOSIS — E860 Dehydration: Principal | ICD-10-CM

## 2016-12-06 LAB — BASIC METABOLIC PANEL
Anion gap: 3 — ABNORMAL LOW (ref 5–15)
BUN: 10 mg/dL (ref 6–20)
CHLORIDE: 105 mmol/L (ref 101–111)
CO2: 28 mmol/L (ref 22–32)
Calcium: 7.9 mg/dL — ABNORMAL LOW (ref 8.9–10.3)
Creatinine, Ser: 0.6 mg/dL (ref 0.44–1.00)
GFR calc non Af Amer: >60 mL/min (ref >60)
Glucose, Bld: 97 mg/dL (ref 65–99)
POTASSIUM: 3.7 mmol/L (ref 3.5–5.1)
SODIUM: 136 mmol/L (ref 135–145)

## 2016-12-06 LAB — URINE CULTURE

## 2016-12-06 LAB — CK: Total CK: 364 U/L — ABNORMAL HIGH (ref 38–234)

## 2016-12-06 MED ORDER — ALPRAZOLAM 1 MG PO TABS: 0.5000 mg | ORAL_TABLET | Freq: Three times a day (TID) | ORAL | 0 refills | Status: DC | PRN

## 2016-12-06 MED ORDER — IBUPROFEN 400 MG PO TABS: 400.0000 mg | ORAL_TABLET | Freq: Four times a day (QID) | ORAL | 0 refills | Status: DC | PRN

## 2016-12-06 MED ORDER — PREGABALIN 75 MG PO CAPS: 75.0000 mg | ORAL_CAPSULE | Freq: Two times a day (BID) | ORAL | 0 refills | Status: DC

## 2016-12-06 MED ORDER — FUROSEMIDE 40 MG PO TABS: 40.0000 mg | ORAL_TABLET | Freq: Every day | ORAL | 11 refills | Status: DC

## 2016-12-06 MED ORDER — POTASSIUM CHLORIDE CRYS ER 20 MEQ PO TBCR: 40.0000 meq | EXTENDED_RELEASE_TABLET | Freq: Every day | ORAL | 11 refills | Status: DC

## 2016-12-06 MED ORDER — BISACODYL 10 MG RE SUPP
10.0000 mg | Freq: Once | RECTAL | Status: AC
  Administered 2016-12-06: 10 mg via RECTAL
  Filled 2016-12-06: qty 1

## 2016-12-06 MED ORDER — DULOXETINE HCL 60 MG PO CPEP: 60.0000 mg | ORAL_CAPSULE | Freq: Every day | ORAL | 0 refills | Status: DC

## 2016-12-06 MED ORDER — OXYCODONE HCL 5 MG PO TABS: 5.0000 mg | ORAL_TABLET | Freq: Four times a day (QID) | ORAL | 0 refills | Status: DC | PRN

## 2016-12-06 NOTE — Progress Notes (Signed)
CSW met with pt again this am to assist with d/c planning. Pt is aware she is ready for d/c. Pt continues to decline SNF placement. Pt reports that her son will be at hospital this am. CSW encouraged pt to have nsg contact CSW when he arrives so CSW can continue assisting with d/c planning. RNCM is aware that pt may return to independent living apt at [facility] if pt continue to decline SNF, and will need assistance with [facility] services.  Werner Lean LCSW [phone]

## 2016-12-06 NOTE — Progress Notes (Signed)
[id number] wants to use [facility]/Rep for [facility] notifed of services for RN,PT and SW/Elisabet Gutzmer,BSN,RN3,CCM

## 2016-12-06 NOTE — Discharge Summary (Signed)
Physician Discharge Summary  Linda [name] [id number] DOB: 1946-03-12 DOA: 12/04/2016  PCP: Tammi Sou, MD  Admit date: 12/04/2016 Discharge date: 12/06/2016  Admitted From: home Disposition:  Home ( independent living)  Recommendations for Outpatient Follow-up:  1. Follow up with PCP in 1-2 weeks 2. Extreme concern for polypharmacy Which is contributing to patient's unsteadiness and weakness.  I have made the following changes to her home medications. Please address and adjust them during outpatient follow-up.  #1. Cymbalta dose reduced to 60 mg once a day (was taking 60mg + 30 mg daily).   #2 Xanax dose reduced to 0.5 mg 3 times a day as needed for anxiety (was taking 1 mg 3 times a day).  #3  oxycodone dose reduced to 5 mg every 6 hours as needed for pain (was taking 10 mg every 6 hours, suspect taking 8 scheduled). Please taper the medication to discontinue if possible.  #4. Lyrica dose reduced to 75 mg twice daily. (Was taking 150 mg twice a day).  #5. Trazodone discontinued.  #6. Lipitor held until outpatient follow-up, please check CPK and resume Lipitor if levels improved or normalized.      Home Health: RN/PT and social worker Equipment/Devices: Oxygen 3 L via nasal cannula, encouraged to use walker with ambulation at all times.  Discharge Condition: Fair CODE STATUS: DO NOT RESUSCITATE Diet recommendation: Heart Healthy    Discharge Diagnoses:  Principal Problem:   Rhabdomyolysis  Active Problems:   HTN (hypertension), benign   Fibromyalgia syndrome   Polypharmacy   Chronic diastolic CHF, NYHA class 1   Chronic respiratory failure (HCC)   Anxiety and depression   Hypokalemia   [name] ([name])   Dehydration  Brief narrative/history of present illness Please refer to admission H&P for details, in brief,70 year old female with BPPV, anxiety and depression, history of fibromyalgia, diastolic CHF asbestosis and hypertension resistant of  independent living felt lightheaded and dizzy and fell at her residence. She was stuck between 2 chairs for almost 14 hours and could not get help all night. She denies loss of consciousness, bowel or urinary incontinence. In the ED vitals were stable except for mild hypertension labs showed CK of 763, WBC of 13.5 K and potassium of 2.6. Chest x-ray unremarkable. X-ray of the cervical and lumbar spine showed degenerative changes.  Hospital course  Fall with dizziness and acute rhabdomyolysis Likely combination of dehydration and polypharmacy. No syncope. EKG unremarkable. -Patient is on multiple medications including Xanax, oxycodone, Abilify, Cymbalta, trazodone and Lyrica. -Medication doses adjusted/discontinued (see above). Added ibuprofen to assist with pain. -CPK improving. Lipitor held. -Patient seen by CT and recommended SNF however she is refusing and wants to go home. -We will arrange home health RN, PT and social work. -Discussed at length with patient's son and went over her medications and plan to adjust the dosing/frequency. Also informed him that I will write new prescriptions for some of the medications that will be changed and that he should discard the medications she already has at home. -Son agrees with patient going home and setting up home health.   Chronic respiratory failure with hypoxia Underlying asbestosis. On 3 L O2 at home. Continue Dulera.  Chronic diastolic CHF Appears dehydrated on admission. Received IV fluids. Now stable. Resume Lasix at lower dose. Continue potassium supplement.  Hypokalemia/hypomagnesemia Replenished  Fibromyalgia Reduced dose of both Cymbalta and oxycodone (see above). Also on Lyrica (dose reduced by 50%)  Coronary artery disease Continue metoprolol and aspirin. Hold statin until CPK checked  as outpatient..  Anxiety/depression Reduce dose of both Cymbalta and Xanax. Continue Abilify. Adjust dose as outpatient. Discontinue  trazodone (prescribed for sleep)  BPPV Not on any medications at home. Patient not dizzy at present.  ?UTI Urine culture growing enterococcus. Patient asymptomatic. Will not treat.    Family Communication  :  discussed in detail with son on the phone  Disposition Plan  :  home with home health    Consults  :  None   Procedures  : CT head and cervical spine   Discharge Instructions   Allergies as of 12/06/2016      Reactions   Penicillins Itching, Swelling, Rash   Tolerated Cefepime in ED. Has patient had a PCN reaction causing immediate rash, facial/tongue/throat swelling, SOB or lightheadedness with hypotension: Yes Has patient had a PCN reaction causing severe rash involving mucus membranes or skin necrosis: No Has patient had a PCN reaction that required hospitalization: No  Has patient had a PCN reaction occurring within the last 10 years: No      Medication List    STOP taking these medications   atorvastatin 20 MG tablet Commonly known as:  LIPITOR   traZODone 50 MG tablet Commonly known as:  DESYREL     TAKE these medications   acetaminophen 325 MG tablet Commonly known as:  TYLENOL Take 2 tablets (650 mg total) by mouth every 4 (four) hours as needed for headache or mild pain.   albuterol 108 (90 Base) MCG/ACT inhaler Commonly known as:  PROVENTIL HFA;VENTOLIN HFA Inhale 2 puffs into the lungs every 6 (six) hours as needed for wheezing or shortness of breath.   ALPRAZolam 1 MG tablet Commonly known as:  XANAX Take 0.5 tablets (0.5 mg total) by mouth 3 (three) times daily as needed for anxiety. What changed:  See the new instructions.   ARIPiprazole 10 MG tablet Commonly known as:  ABILIFY TAKE 1 TABLET ONCE DAILY.   aspirin 81 MG tablet Take 81 mg by mouth at bedtime.   budesonide-formoterol 160-4.5 MCG/ACT inhaler Commonly known as:  SYMBICORT Inhale 2 puffs into the lungs 2 (two) times daily.   diclofenac sodium 1 %  Gel Commonly known as:  VOLTAREN Apply 2 g topically 3 (three) times daily as needed (pain).   DULoxetine 60 MG capsule Commonly known as:  CYMBALTA Take 1 capsule (60 mg total) by mouth at bedtime. What changed:  additional instructions  Another medication with the same name was removed. Continue taking this medication, and follow the directions you see here.   folic acid 1 MG tablet Commonly known as:  FOLVITE Take 2 tablets (2 mg total) by mouth daily. What changed:  how much to take   furosemide 40 MG tablet Commonly known as:  LASIX Take 1 tablet (40 mg total) by mouth daily. What changed:  how much to take   ibuprofen 400 MG tablet Commonly known as:  ADVIL,MOTRIN Take 1 tablet (400 mg total) by mouth every 6 (six) hours as needed for moderate pain.   isosorbide mononitrate 30 MG 24 hr tablet Commonly known as:  IMDUR Take 30 mg by mouth daily.   metoprolol succinate 25 MG 24 hr tablet Commonly known as:  TOPROL XL Take 1 tablet (25 mg total) by mouth 2 (two) times daily.   multivitamin with minerals Tabs tablet Take 1 tablet by mouth daily.   nitroGLYCERIN 0.4 MG SL tablet Commonly known as:  NITROSTAT Place 0.4 mg under the tongue every 5 (five)  minutes as needed for chest pain (x 3 doses).   oxyCODONE 5 MG immediate release tablet Commonly known as:  Oxy IR/ROXICODONE Take 1 tablet (5 mg total) by mouth every 6 (six) hours as needed for moderate pain or breakthrough pain. What changed:  medication strength  how much to take  when to take this  reasons to take this   OXYGEN Inhale 3 L into the lungs continuous.   pantoprazole 40 MG tablet Commonly known as:  PROTONIX Take 40 mg by mouth daily.   polyethylene glycol packet Commonly known as:  MIRALAX / GLYCOLAX Take 17 g by mouth daily as needed (constipation). Mix in 8 oz liquid and drink   potassium chloride SA 20 MEQ tablet Commonly known as:  K-DUR,KLOR-CON Take 2 tablets (40 mEq total)  by mouth daily. What changed:  how much to take   pregabalin 75 MG capsule Commonly known as:  LYRICA Take 1 capsule (75 mg total) by mouth 2 (two) times daily. What changed:  See the new instructions.   Vitamin D3 50000 units Caps Take 1 capsule by mouth every Monday.      Follow-up Information    MCGOWEN,PHILIP H, MD. Schedule an appointment as soon as possible for a visit in 1 week(s).   Specialty:  Family Medicine Contact information: [address] [place]  [address] [phone]          Allergies  Allergen Reactions  . Penicillins Itching, Swelling and Rash    Tolerated Cefepime in ED. Has patient had a PCN reaction causing immediate rash, facial/tongue/throat swelling, SOB or lightheadedness with hypotension: Yes Has patient had a PCN reaction causing severe rash involving mucus membranes or skin necrosis: No Has patient had a PCN reaction that required hospitalization: No  Has patient had a PCN reaction occurring within the last 10 years: No        Procedures/Studies: Dg Chest 2 View  Result Date: 12/04/2016 CLINICAL DATA:  Back pain after fall. EXAM: CHEST  2 VIEW COMPARISON:  Radiographs of May 31, 2016. FINDINGS: Stable cardiomediastinal silhouette. Atherosclerosis of thoracic aorta is noted. Stable calcified bilateral pleural plaques are noted. Both lungs are clear. The visualized skeletal structures are unremarkable. IMPRESSION: Aortic atherosclerosis. Stable calcified pleural plaques. No acute cardiopulmonary abnormality seen. Electronically Signed   By: Marijo Conception, M.D.   On: 12/04/2016 13:34   Dg Thoracic Spine 2 View  Result Date: 12/04/2016 CLINICAL DATA:  Upper back pain after fall. EXAM: THORACIC SPINE 2 VIEWS COMPARISON:  CT scan of January 01, 2016. FINDINGS: There is no evidence of thoracic spine fracture. Alignment is normal. No other significant bone abnormalities are identified. IMPRESSION: No significant abnormality seen in the  thoracic spine. Electronically Signed   By: Marijo Conception, M.D.   On: 12/04/2016 13:38   Dg Lumbar Spine Complete  Result Date: 12/04/2016 CLINICAL DATA:  Low back pain after fall. EXAM: LUMBAR SPINE - COMPLETE 4+ VIEW COMPARISON:  Radiographs of September 13, 2015. FINDINGS: No fracture is noted. Stable grade 1 anterolisthesis of L5-S1 is noted secondary to posterior facet joint hypertrophy. Mild degenerative disc disease is noted at L3-4. Remaining disc spaces appear intact. IMPRESSION: Mild degenerative disc disease is noted L3-4. No acute abnormality seen in the lumbar spine. Electronically Signed   By: Marijo Conception, M.D.   On: 12/04/2016 13:36   Ct Head Wo Contrast  Result Date: 12/04/2016 CLINICAL DATA:  Dizziness, back pain, fall EXAM: CT HEAD  WITHOUT CONTRAST CT CERVICAL SPINE WITHOUT CONTRAST TECHNIQUE: Multidetector CT imaging of the head and cervical spine was performed following the standard protocol without intravenous contrast. Multiplanar CT image reconstructions of the cervical spine were also generated. COMPARISON:  01/01/2016 FINDINGS: CT HEAD FINDINGS Brain: No intracranial hemorrhage, mass effect or midline shift. Stable mild cerebral atrophy. No acute cortical infarction. Ventricular size is stable from prior exam. No mass lesion is noted on this unenhanced scan. Vascular: No hyperdense vessel or unexpected calcification. Skull: No skull fracture is noted. Sinuses/Orbits: Paranasal sinuses are unremarkable. No intraorbital hematoma. A Other: None CT CERVICAL SPINE FINDINGS Alignment: There is normal alignment. Skull base and vertebrae: Diffuse osteopenia is noted. Degenerative changes are noted C1-C2 articulation. Mild anterior and mild posterior spurring at C5-C6 and C6-C7 level. Soft tissues and spinal canal: No prevertebral soft tissue swelling. Spinal canal is patent. Disc levels: There is disc space flattening at C5-C6 and C6-C7 level. Upper chest: No pneumothorax in  visualized lung apices. There is pleuroparenchymal scarring in right apex medially Other: Atherosclerotic calcifications are noted bilateral carotid bifurcation IMPRESSION: 1. No acute intracranial abnormality.  Stable cerebral atrophy. 2. No cervical spine acute fracture or subluxation. Degenerative changes as described above. Electronically Signed   By: Lahoma Crocker M.D.   On: 12/04/2016 13:14   Ct Cervical Spine Wo Contrast  Result Date: 12/04/2016 CLINICAL DATA:  Dizziness, back pain, fall EXAM: CT HEAD WITHOUT CONTRAST CT CERVICAL SPINE WITHOUT CONTRAST TECHNIQUE: Multidetector CT imaging of the head and cervical spine was performed following the standard protocol without intravenous contrast. Multiplanar CT image reconstructions of the cervical spine were also generated. COMPARISON:  01/01/2016 FINDINGS: CT HEAD FINDINGS Brain: No intracranial hemorrhage, mass effect or midline shift. Stable mild cerebral atrophy. No acute cortical infarction. Ventricular size is stable from prior exam. No mass lesion is noted on this unenhanced scan. Vascular: No hyperdense vessel or unexpected calcification. Skull: No skull fracture is noted. Sinuses/Orbits: Paranasal sinuses are unremarkable. No intraorbital hematoma. A Other: None CT CERVICAL SPINE FINDINGS Alignment: There is normal alignment. Skull base and vertebrae: Diffuse osteopenia is noted. Degenerative changes are noted C1-C2 articulation. Mild anterior and mild posterior spurring at C5-C6 and C6-C7 level. Soft tissues and spinal canal: No prevertebral soft tissue swelling. Spinal canal is patent. Disc levels: There is disc space flattening at C5-C6 and C6-C7 level. Upper chest: No pneumothorax in visualized lung apices. There is pleuroparenchymal scarring in right apex medially Other: Atherosclerotic calcifications are noted bilateral carotid bifurcation IMPRESSION: 1. No acute intracranial abnormality.  Stable cerebral atrophy. 2. No cervical spine acute  fracture or subluxation. Degenerative changes as described above. Electronically Signed   By: Lahoma Crocker M.D.   On: 12/04/2016 13:14   Dg Hips Bilat W Or Wo Pelvis 5 Views  Result Date: 12/04/2016 CLINICAL DATA:  Bilateral hip pain after fall. EXAM: DG HIP (WITH OR WITHOUT PELVIS) 5+V BILAT COMPARISON:  None. FINDINGS: There is no evidence of hip fracture or dislocation. There is no evidence of arthropathy or other focal bone abnormality. IMPRESSION: Normal bilateral hips. Electronically Signed   By: Marijo Conception, M.D.   On: 12/04/2016 13:40       Subjective: No overnight events.  Discharge Exam: Vitals:   12/06/16 0930 12/06/16 1401  BP: (!) 140/52 (!) 149/80  Pulse: 81 62  Resp:  14  Temp:  98.6 F (37 C)   Vitals:   12/06/16 0559 12/06/16 0609 12/06/16 0930 12/06/16 1401  BP: 133/61  [name] Kitchen)  140/52 (!) 149/80  Pulse: 69  81 62  Resp: 16   14  Temp: 97.4 F (36.3 C)   98.6 F (37 C)  TempSrc: Oral   Oral  SpO2: 97%   97%  Weight:  79.2 kg (174 lb 9.7 oz)      Gen: not in distress of flat affect HEENT: moist mucosa, supple neck Chest: clear b/l, no added sounds CVS: N S1&S2, no murmurs, rubs or gallop GI: soft, NT, ND, BS+ Musculoskeletal: warm, no edema CNS: Alert and oriented, has flat affect and communicate slowly.    The results of significant diagnostics from this hospitalization (including imaging, microbiology, ancillary and laboratory) are listed below for reference.     Microbiology: Recent Results (from the past 240 hour(s))  Urine culture     Status: Abnormal   Collection Time: 12/04/16  2:25 PM  Result Value Ref Range Status   Specimen Description URINE, CLEAN CATCH  Final   Special Requests NONE  Final   Culture (A)  Final    >=100,000 COLONIES/mL AEROCOCCUS URINAE Standardized susceptibility testing for this organism is not available. Performed at [facility]    Report Status 12/06/2016 FINAL  Final     Labs: BNP (last 3  results)  Recent Labs  04/08/16 1800 05/30/16 1302 09/19/16 1800  BNP 30.2 87.9 [id number]   Basic Metabolic Panel:  Recent Labs Lab 12/04/16 1349 12/05/16 0401 12/06/16 0426  NA 139 141 136  K 2.6* 2.9* 3.7  CL 95* 104 105  CO2 30 30 28   GLUCOSE 126* 112* 97  BUN 8 7 10   CREATININE 0.64 0.57 0.60  CALCIUM 9.9 8.3* 7.9*  MG  --  1.7  --    Liver Function Tests:  Recent Labs Lab 12/04/16 1349 12/05/16 0401  AST 33 25  ALT 23 17  ALKPHOS 108 71  BILITOT 1.3* 0.8  PROT 9.1* 6.5  ALBUMIN 5.2* 3.5   No results for input(s): LIPASE, AMYLASE in the last 168 hours. No results for input(s): AMMONIA in the last 168 hours. CBC:  Recent Labs Lab 12/04/16 1349 12/05/16 0401  WBC 13.5* 10.1  NEUTROABS 11.8*  --   HGB 16.1* 12.9  HCT 47.0* 39.5  MCV 88.2 88.8  PLT 229 192   Cardiac Enzymes:  Recent Labs Lab 12/04/16 1349 12/05/16 0401 12/06/16 0426  CKTOTAL 763* 724* 364*   BNP: Invalid input(s): POCBNP CBG: No results for input(s): GLUCAP in the last 168 hours. D-Dimer No results for input(s): DDIMER in the last 72 hours. Hgb A1c No results for input(s): HGBA1C in the last 72 hours. Lipid Profile No results for input(s): CHOL, HDL, LDLCALC, TRIG, CHOLHDL, LDLDIRECT in the last 72 hours. Thyroid function studies No results for input(s): TSH, T4TOTAL, T3FREE, THYROIDAB in the last 72 hours.  Invalid input(s): FREET3 Anemia work up No results for input(s): VITAMINB12, FOLATE, FERRITIN, TIBC, IRON, RETICCTPCT in the last 72 hours. Urinalysis    Component Value Date/Time   COLORURINE YELLOW 12/04/2016 [place] 12/04/2016 1425   LABSPEC 1.011 12/04/2016 1425   PHURINE 5.0 12/04/2016 1425   GLUCOSEU NEGATIVE 12/04/2016 1425   HGBUR SMALL (A) 12/04/2016 1425   BILIRUBINUR NEGATIVE 12/04/2016 1425   BILIRUBINUR small 10/01/2016 1253   KETONESUR 5 (A) 12/04/2016 1425   PROTEINUR NEGATIVE 12/04/2016 1425   UROBILINOGEN 1.0 10/01/2016 1253    UROBILINOGEN 1.0 10/16/2015 2009   NITRITE NEGATIVE 12/04/2016 1425   LEUKOCYTESUR NEGATIVE 12/04/2016 1425  Sepsis Labs Invalid input(s): PROCALCITONIN,  WBC,  LACTICIDVEN Microbiology Recent Results (from the past 240 hour(s))  Urine culture     Status: Abnormal   Collection Time: 12/04/16  2:25 PM  Result Value Ref Range Status   Specimen Description URINE, CLEAN CATCH  Final   Special Requests NONE  Final   Culture (A)  Final    >=100,000 COLONIES/mL AEROCOCCUS URINAE Standardized susceptibility testing for this organism is not available. Performed at [facility]    Report Status 12/06/2016 FINAL  Final     Time coordinating discharge: <30 minutes  SIGNED:   Louellen Molder, MD  [facility] 12/06/2016, 3:37 PM Pager   If 7PM-7AM, please contact night-coverage [email or web address] Password [email or web address]

## 2016-12-06 NOTE — Progress Notes (Signed)
Has a bowel movement after Bisacodyl suppository administered.

## 2016-12-06 NOTE — Progress Notes (Signed)
Patient d/c home. Stable. No c/o pain. 

## 2016-12-06 NOTE — Progress Notes (Signed)
Discharge instructions discussed with patient, verbalized understanding. I also explained to her re: home meds that she needed to discard per Dr. Clementeen Graham, lists were also provided. She is familiar with her other home meds. Prescriptions handed to her. All questions answered appropriately. Awaiting for son to come transport her back to the Independent Living.

## 2016-12-09 ENCOUNTER — Other Ambulatory Visit: Payer: Self-pay | Admitting: Family Medicine

## 2016-12-09 NOTE — Telephone Encounter (Signed)
RF request for isosobride LOV: 10/01/16 Next ov: 12/12/16 Last written: 08/02/16 #30 w/ 3RF

## 2016-12-11 DIAGNOSIS — M170 Bilateral primary osteoarthritis of knee: Secondary | ICD-10-CM | POA: Diagnosis not present

## 2016-12-11 DIAGNOSIS — M25561 Pain in right knee: Secondary | ICD-10-CM | POA: Diagnosis not present

## 2016-12-11 DIAGNOSIS — M25562 Pain in left knee: Secondary | ICD-10-CM | POA: Diagnosis not present

## 2016-12-12 ENCOUNTER — Ambulatory Visit: Payer: Medicare Other | Admitting: Family Medicine

## 2016-12-12 DIAGNOSIS — H8110 Benign paroxysmal vertigo, unspecified ear: Secondary | ICD-10-CM | POA: Diagnosis not present

## 2016-12-12 DIAGNOSIS — M797 Fibromyalgia: Secondary | ICD-10-CM | POA: Diagnosis not present

## 2016-12-12 DIAGNOSIS — M6282 Rhabdomyolysis: Secondary | ICD-10-CM | POA: Diagnosis not present

## 2016-12-12 DIAGNOSIS — F419 Anxiety disorder, unspecified: Secondary | ICD-10-CM | POA: Diagnosis not present

## 2016-12-12 DIAGNOSIS — W19XXXD Unspecified fall, subsequent encounter: Secondary | ICD-10-CM | POA: Diagnosis not present

## 2016-12-12 DIAGNOSIS — I110 Hypertensive heart disease with heart failure: Secondary | ICD-10-CM | POA: Diagnosis not present

## 2016-12-12 DIAGNOSIS — I5032 Chronic diastolic (congestive) heart failure: Secondary | ICD-10-CM | POA: Diagnosis not present

## 2016-12-12 DIAGNOSIS — Z9981 Dependence on supplemental oxygen: Secondary | ICD-10-CM | POA: Diagnosis not present

## 2016-12-12 DIAGNOSIS — F329 Major depressive disorder, single episode, unspecified: Secondary | ICD-10-CM | POA: Diagnosis not present

## 2016-12-12 DIAGNOSIS — J9611 Chronic respiratory failure with hypoxia: Secondary | ICD-10-CM | POA: Diagnosis not present

## 2016-12-12 DIAGNOSIS — J61 Pneumoconiosis due to asbestos and other mineral fibers: Secondary | ICD-10-CM | POA: Diagnosis not present

## 2016-12-12 DIAGNOSIS — D510 Vitamin B12 deficiency anemia due to intrinsic factor deficiency: Secondary | ICD-10-CM | POA: Diagnosis not present

## 2016-12-13 ENCOUNTER — Ambulatory Visit: Payer: Medicare Other | Admitting: Family Medicine

## 2016-12-13 DIAGNOSIS — I110 Hypertensive heart disease with heart failure: Secondary | ICD-10-CM | POA: Diagnosis not present

## 2016-12-13 DIAGNOSIS — M797 Fibromyalgia: Secondary | ICD-10-CM | POA: Diagnosis not present

## 2016-12-13 DIAGNOSIS — J61 Pneumoconiosis due to asbestos and other mineral fibers: Secondary | ICD-10-CM | POA: Diagnosis not present

## 2016-12-13 DIAGNOSIS — I5032 Chronic diastolic (congestive) heart failure: Secondary | ICD-10-CM | POA: Diagnosis not present

## 2016-12-13 DIAGNOSIS — F329 Major depressive disorder, single episode, unspecified: Secondary | ICD-10-CM | POA: Diagnosis not present

## 2016-12-13 DIAGNOSIS — M6282 Rhabdomyolysis: Secondary | ICD-10-CM | POA: Diagnosis not present

## 2016-12-19 DIAGNOSIS — I5032 Chronic diastolic (congestive) heart failure: Secondary | ICD-10-CM | POA: Diagnosis not present

## 2016-12-19 DIAGNOSIS — M797 Fibromyalgia: Secondary | ICD-10-CM | POA: Diagnosis not present

## 2016-12-19 DIAGNOSIS — M6282 Rhabdomyolysis: Secondary | ICD-10-CM | POA: Diagnosis not present

## 2016-12-19 DIAGNOSIS — F329 Major depressive disorder, single episode, unspecified: Secondary | ICD-10-CM | POA: Diagnosis not present

## 2016-12-19 DIAGNOSIS — J61 Pneumoconiosis due to asbestos and other mineral fibers: Secondary | ICD-10-CM | POA: Diagnosis not present

## 2016-12-19 DIAGNOSIS — I110 Hypertensive heart disease with heart failure: Secondary | ICD-10-CM | POA: Diagnosis not present

## 2016-12-20 ENCOUNTER — Encounter: Payer: Self-pay | Admitting: Physician Assistant

## 2016-12-20 ENCOUNTER — Encounter: Payer: Medicare Other | Admitting: Registered Nurse

## 2016-12-20 ENCOUNTER — Ambulatory Visit (INDEPENDENT_AMBULATORY_CARE_PROVIDER_SITE_OTHER): Payer: Medicare Other | Admitting: Physician Assistant

## 2016-12-20 VITALS — BP 118/68 | HR 79 | Ht 64.0 in | Wt 166.0 lb

## 2016-12-20 DIAGNOSIS — I5032 Chronic diastolic (congestive) heart failure: Secondary | ICD-10-CM | POA: Diagnosis not present

## 2016-12-20 DIAGNOSIS — M6282 Rhabdomyolysis: Secondary | ICD-10-CM | POA: Diagnosis not present

## 2016-12-20 DIAGNOSIS — Z79899 Other long term (current) drug therapy: Secondary | ICD-10-CM | POA: Diagnosis not present

## 2016-12-20 DIAGNOSIS — R0981 Nasal congestion: Secondary | ICD-10-CM

## 2016-12-20 DIAGNOSIS — I2583 Coronary atherosclerosis due to lipid rich plaque: Secondary | ICD-10-CM

## 2016-12-20 DIAGNOSIS — T796XXD Traumatic ischemia of muscle, subsequent encounter: Secondary | ICD-10-CM

## 2016-12-20 DIAGNOSIS — I2510 Atherosclerotic heart disease of native coronary artery without angina pectoris: Secondary | ICD-10-CM

## 2016-12-20 MED ORDER — IPRATROPIUM BROMIDE 0.03 % NA SOLN: 2.0000 | Freq: Two times a day (BID) | NASAL | 0 refills | Status: DC

## 2016-12-20 MED ORDER — FEXOFENADINE HCL 180 MG PO TABS: 180.0000 mg | ORAL_TABLET | Freq: Every day | ORAL | 0 refills | Status: DC

## 2016-12-20 NOTE — Patient Instructions (Signed)
Medication Instructions:  REFILLED ALLEGRA AND IPRATROPIUM CONTACT PRIMARY CARE FOR FUTURE REFILLS   If you need a refill on your cardiac medications before your next appointment, please call your pharmacy.  Labwork: CK AND BMET TODAY AT [facility] ON THE 1ST FLOOR  Follow-Up: AS CURRENTLY SCHEDULED WITH DR Stanford Breed 02-03-2017 @3PM   Special Instructions: OK TO TAKE EXTRA LASIX FOR FUTURE WEIGHT GAIN  CONTINUE FLUIDS AS DIRECTED  START LOW SODIUM DIET    Thank you for choosing [facility] at [employer], LPN RHONDA BARRETT, PA-C   DASH Eating Plan DASH stands for "Dietary Approaches to Stop Hypertension." The DASH eating plan is a healthy eating plan that has been shown to reduce high blood pressure (hypertension). Additional health benefits may include reducing the risk of type 2 diabetes mellitus, heart disease, and stroke. The DASH eating plan may also help with weight loss. What do I need to know about the DASH eating plan? For the DASH eating plan, you will follow these general guidelines:  Choose foods with less than 150 milligrams of sodium per serving (as listed on the food label).  Use salt-free seasonings or herbs instead of table salt or sea salt.  Check with your health care provider or pharmacist before using salt substitutes.  Eat lower-sodium products. These are often labeled as "low-sodium" or "no salt added."  Eat fresh foods. Avoid eating a lot of canned foods.  Eat more vegetables, fruits, and low-fat dairy products.  Choose whole grains. Look for the word "whole" as the first word in the ingredient list.  Choose fish and skinless chicken or [place] more often than red meat. Limit fish, poultry, and meat to 6 oz (170 g) each day.  Limit sweets, desserts, sugars, and sugary drinks.  Choose heart-healthy fats.  Eat more home-cooked food and less restaurant, buffet, and fast food.  Limit fried foods.  Do not fry foods. Cook foods  using methods such as baking, boiling, grilling, and broiling instead.  When eating at a restaurant, ask that your food be prepared with less salt, or no salt if possible. What foods can I eat? Seek help from a dietitian for individual calorie needs. Grains  Whole grain or whole wheat bread. Brown rice. Whole grain or whole wheat pasta. Quinoa, bulgur, and whole grain cereals. Low-sodium cereals. Corn or whole wheat flour tortillas. Whole grain cornbread. Whole grain crackers. Low-sodium crackers. Vegetables  Fresh or frozen vegetables (raw, steamed, roasted, or grilled). Low-sodium or reduced-sodium tomato and vegetable juices. Low-sodium or reduced-sodium tomato sauce and paste. Low-sodium or reduced-sodium canned vegetables. Fruits  All fresh, canned (in natural juice), or frozen fruits. Meat and Other Protein Products  Ground beef (85% or leaner), grass-fed beef, or beef trimmed of fat. Skinless chicken or [place]. Ground chicken or [place]. Pork trimmed of fat. All fish and seafood. Eggs. Dried beans, peas, or lentils. Unsalted nuts and seeds. Unsalted canned beans. Dairy  Low-fat dairy products, such as skim or 1% milk, 2% or reduced-fat cheeses, low-fat ricotta or cottage cheese, or plain low-fat yogurt. Low-sodium or reduced-sodium cheeses. Fats and Oils  Tub margarines without trans fats. Light or reduced-fat mayonnaise and salad dressings (reduced sodium). Avocado. Safflower, olive, or canola oils. Natural peanut or almond butter. Other  Unsalted popcorn and pretzels. The items listed above may not be a complete list of recommended foods or beverages. Contact your dietitian for more options.  What foods are not recommended? Grains  White bread. White pasta. White  rice. Refined cornbread. Bagels and croissants. Crackers that contain trans fat. Vegetables  Creamed or fried vegetables. Vegetables in a cheese sauce. Regular canned vegetables. Regular canned tomato sauce and paste. Regular  tomato and vegetable juices. Fruits  Canned fruit in light or heavy syrup. Fruit juice. Meat and Other Protein Products  Fatty cuts of meat. Ribs, chicken wings, bacon, sausage, bologna, salami, chitterlings, fatback, hot dogs, bratwurst, and packaged luncheon meats. Salted nuts and seeds. Canned beans with salt. Dairy  Whole or 2% milk, cream, half-and-half, and cream cheese. Whole-fat or sweetened yogurt. Full-fat cheeses or blue cheese. Nondairy creamers and whipped toppings. Processed cheese, cheese spreads, or cheese curds. Condiments  Onion and garlic salt, seasoned salt, table salt, and sea salt. Canned and packaged gravies. Worcestershire sauce. Tartar sauce. Barbecue sauce. Teriyaki sauce. Soy sauce, including reduced sodium. Steak sauce. Fish sauce. Oyster sauce. Cocktail sauce. Horseradish. Ketchup and mustard. Meat flavorings and tenderizers. Bouillon cubes. Hot sauce. Tabasco sauce. Marinades. Taco seasonings. Relishes. Fats and Oils  Butter, stick margarine, lard, shortening, ghee, and bacon fat. Coconut, palm kernel, or palm oils. Regular salad dressings. Other  Pickles and olives. Salted popcorn and pretzels. The items listed above may not be a complete list of foods and beverages to avoid. Contact your dietitian for more information.  Where can I find more information? National Heart, Lung, and Blood Institute: travelstabloid.com This information is not intended to replace advice given to you by your health care provider. Make sure you discuss any questions you have with your health care provider. Document Released: 11/28/2011 Document Revised: 05/16/2016 Document Reviewed: 10/13/2013 Elsevier Interactive Patient Education  2017 [employer].

## 2016-12-20 NOTE — Progress Notes (Signed)
Cardiology Office Note   Date:  12/20/2016   ID:  Linda [name], DOB May 17, 1946, MRN [id number]  PCP:  Tammi Sou, MD  Cardiologist:  Dr Alcide Evener, PA-C   Chief Complaint  [name] presents with  . Follow-up    History of Present Illness: Linda [name] is a 70 y.o. female with a history of BPPV, anxiety and depression, diastolic CHF, asbestosis, COPD on O2 3 lpm, HTN, mod CAD by cath 2014 -med rx, fibromyalgia w/ chronic pain issues. RBBB and inf Q waves on ECG  Admit 12/13-12/15 for Rhabdo after a fall where she was down for 14 hours, Lipitor and Trazodone d/c'd, Cymbalta, Xanax, oxycodone, Lyrica doses decreased  Linda [name] presents for follow up  She was dizzy and then fell, laid on Linda floor 14.5 hours before being found prior to admission.  Since she went home from [facility] hospital, she has been doing well. She has continued to have pain and soreness in multiple areas. She is getting able to take a deep breath without pain. Other pain is improving.   Her weight is stable, trending down a little. She is compliant with her dietary restrictions and is taking meds as prescribed.   She gets a little chest pain going out in [place] cold, when she breathes. She is walking a little more, doing well with that.   Her weight is 163 lbs today, it has been stable, within 2 lbs for over a week. This will be her new dry weight.  Her nose runs constantly, she has tried Benadryl, Allegra, and Claritin, without effect.  She requests ipratropium nasal spray   Past Medical History:  Diagnosis Date  . Acute upper GI bleed 06/2014   while pt taking coumadin, plavix, and meloxicam---despite being told not to take coumadin.  . Anginal pain ([place])    Nonobstructive CAD 2014; however, her cardiologist put her on a statin for this and NOT for hyperlipidemia per pt report.  . Anxiety    panic attacks  . Asthma    w/ asbestososis   . BPPV (benign paroxysmal  positional vertigo) 12/16/2012  . Chronic diastolic CHF (congestive heart failure) (H[facility])    dry wt as of 11/06/16 is 168 lbs.  . Chronic lower back pain   . COPD (chronic obstructive pulmonary disease) ([place])   . DDD (degenerative disc disease)    lumbar and cervical.   . Depression   . Diverticular disease   . Fibromyalgia    [name] states dx was around her late 54s but she had sx's for years prior to this.  . H/O hiatal hernia   . Hay fever   . History of pneumonia    hospitalized 12/2011, 02/2013, and 07/2013 [facility]) for this  . HTN (hypertension)   . Idiopathic angio-edema-urticaria 72014   Angioedema component was very minimal  . Insomnia   . Iron deficiency anemia    Hematologist in [place], [place] did extensive w/u; no cause found; failed oral supplement;; gets fairly regular (q25m or so) IV iron infusions (Venofer -iron sucrose- 200mg  with procrit.  "for 14 yr I've been getting blood work q month & getting infusions prn" (07/12/2013).  Dr. Marin Olp locally, iron infusions done, EPO deficiency dx'd  . Migraine syndrome    "not as often anymore; used to be ~ q wk" (07/12/2013)  . Mixed incontinence urge and stress   . Nephrolithiasis    "passed all on my own or they are still  in there" (07/12/2013)  . OSA on CPAP    prior to move to [place]--had another sleep study 10/2015 w/pulm Dr. Camillo Flaming.  . Osteoarthritis    "severe; progressing fast" (07/12/2013); multiple joints-not surgical candidate for TKR (03/2015)  . Pernicious anemia 08/24/2014  . Pleural plaque with presence of asbestos 07/22/2013  . Pulmonary embolism ([place]) 07/2013   Dx at [facility] with very small peripheral upper lobe pe 07/2013: pt took coumadin for about 8-9 mo  . Pyelonephritis    "several times over Linda last 30 yr" (07/12/2013)  . RBBB (right bundle branch block)   . Recurrent UTI    hx of hospitalization for pyelonephritis; started abx prophylaxis 06/2015  . Syncope    Hypotensive; ED visit--Dr. Terrence Dupont did Cath--nonobstructive  CAD, EF 55-60%.  In retrospect, suspect pt rx med misuse/polypharmacy  . Tension headache, chronic     Past Surgical History:  Procedure Laterality Date  . APPENDECTOMY  1960  . AXILLARY SURGERY Left 1978   Multiple "lump" in armpit per pt  . CARDIAC CATHETERIZATION  01/2013   nonobstructive CAD, EF 55-60%  . CARDIOVASCULAR STRESS TEST  02/22/15   Low risk myocard perf imaging; wall motion normal, normal EF  . COCCYX REMOVAL  1972  . DILATION AND CURETTAGE OF UTERUS  ? 1970's  . ESOPHAGOGASTRODUODENOSCOPY N/A 07/19/2014   Gastritis found + in Linda setting of supratherapeutic INR, +plavix, + meloxicam.  . EYE SURGERY Left 2012-2013   "injections for ~ 1 yr; don't really know what for" (07/12/2013)  . HEEL SPUR SURGERY Left 2008  . KNEE SURGERY  2005  . LEFT HEART CATHETERIZATION WITH CORONARY ANGIOGRAM N/A 01/30/2013   Procedure: LEFT HEART CATHETERIZATION WITH CORONARY ANGIOGRAM;  Surgeon: Clent Demark, MD;  Location[facility] C[facility] LAB;  Service: Cardiovascular;  Laterality: N/A;  . PLANTAR FASCIA RELEASE Left 2008  . SPIROMETRY  04/25/14   In hosp for acute asthma/COPD flare: mixed obstructive and restrictive lung disease. Linda FEV1 is severely reduced at 45% predicted.  FEV1 signif decreased compared to prior spirometry 07/23/13.  . TENDON RELEASE  1996   Right forearm and hand  . TOTAL ABDOMINAL HYSTERECTOMY  1974  . TRANSTHORACIC ECHOCARDIOGRAM  01/2013; 04/2014;08/2015   2014--NORMAL.  2015--focal basal septal hypertrophy, EF 55-60%, grade I diast dysfxn, mild LAE.  08/2015 EF 55-60%, nl LV syst fxn, grade I DD, valves wnl    Current Outpatient Prescriptions  Medication Sig Dispense Refill  . acetaminophen (TYLENOL) 325 MG tablet Take 2 tablets (650 mg total) by mouth every 4 (four) hours as needed for headache or mild pain.    [name] Kitchen albuterol (PROVENTIL HFA;VENTOLIN HFA) 108 (90 BASE) MCG/ACT inhaler Inhale 2 puffs into Linda lungs every 6 (six) hours as needed for wheezing or shortness of breath. 1  Inhaler 1  . ALPRAZolam (XANAX) 1 MG tablet Take 1 mg by mouth 3 (three) times daily as needed for anxiety.    . ARIPiprazole (ABILIFY) 10 MG tablet TAKE 1 TABLET ONCE DAILY. 90 tablet 3  . aspirin 81 MG tablet Take 81 mg by mouth at bedtime.    [name] Kitchen atorvastatin (LIPITOR) 20 MG tablet Take 1 tablet by mouth at bedtime.    . budesonide-formoterol (SYMBICORT) 160-4.5 MCG/ACT inhaler Inhale 2 puffs into Linda lungs 2 (two) times daily. 1 Inhaler 12  . Cholecalciferol (VITAMIN D3) 50000 units CAPS Take 1 capsule by mouth every Monday.     . diclofenac sodium (VOLTAREN) 1 % GEL Apply 2 g topically 3 (  three) times daily as needed (pain). 2 Tube 2  . DULoxetine (CYMBALTA) 60 MG capsule Take 1 capsule (60 mg total) by mouth at bedtime. 10 capsule 0  . folic acid (FOLVITE) 1 MG tablet Take 2 tablets (2 mg total) by mouth daily. ([name] taking differently: Take 1 mg by mouth daily. ) 60 tablet 6  . furosemide (LASIX) 40 MG tablet Take 1 tablet (40 mg total) by mouth daily. 60 tablet 11  . ibuprofen (ADVIL,MOTRIN) 400 MG tablet Take 1 tablet (400 mg total) by mouth every 6 (six) hours as needed for moderate pain. 30 tablet 0  . isosorbide mononitrate (IMDUR) 30 MG 24 hr tablet TAKE 1 TABLET ONCE DAILY. 30 tablet 6  . LYRICA 300 MG capsule Take 1 tablet by mouth daily.    . metoprolol succinate (TOPROL XL) 25 MG 24 hr tablet Take 1 tablet (25 mg total) by mouth 2 (two) times daily. 60 tablet 11  . Multiple Vitamin (MULTIVITAMIN WITH MINERALS) TABS tablet Take 1 tablet by mouth daily.    . nitroGLYCERIN (NITROSTAT) 0.4 MG SL tablet Place 0.4 mg under Linda tongue every 5 (five) minutes as needed for chest pain (x 3 doses).     [name] Kitchen oxyCODONE (OXY IR/ROXICODONE) 5 MG immediate release tablet Take 1 tablet (5 mg total) by mouth every 6 (six) hours as needed for moderate pain or breakthrough pain. 30 tablet 0  . OXYGEN Inhale 3 L into Linda lungs continuous.    . pantoprazole (PROTONIX) 40 MG tablet Take 40 mg by mouth  daily.    . polyethylene glycol (MIRALAX / GLYCOLAX) packet Take 17 g by mouth daily as needed (constipation). Mix in 8 oz liquid and drink    . potassium chloride SA (K-DUR,KLOR-CON) 20 MEQ tablet Take 2 tablets (40 mEq total) by mouth daily. 120 tablet 11  . traZODone (DESYREL) 50 MG tablet Take 2-4 tablets by mouth at bedtime.     No current facility-administered medications for this visit.    Facility-Administered Medications Ordered in Other Visits  Medication Dose Route Frequency Provider Last Rate Last Dose  . ferumoxytol (FERAHEME) 510 mg in sodium chloride 0.9 % 100 mL IVPB  510 mg Intravenous Once Volanda Napoleon, MD        Allergies:   Penicillins    Social History:  Linda [name]  reports that she has never smoked. She has never used smokeless tobacco. She reports that she does not drink alcohol or use drugs.   Family History:  Linda [name]'s family history includes Arthritis in her mother; Diabetes in her father; Heart attack in her father and paternal grandmother; Heart disease in her father; Hypertension in her father; Kidney disease in her mother; Stroke in her father.    ROS:  Please see Linda history of present illness. All other systems are reviewed and negative.    PHYSICAL EXAM: VS:  BP 118/68   Pulse 79   Ht 5\' 4"  (1.626 m)   Wt 166 lb (75.3 kg)   BMI 28.49 kg/m  , BMI Body mass index is 28.49 kg/m. GEN: Well nourished, well developed, female in no acute distress  HEENT: normal for age  Neck: no JVD, no carotid bruit, no masses Cardiac: RRR; soft murmur, no rubs, or gallops Respiratory:  Decreased BS bases bilaterally, normal work of breathing GI: soft, nontender, nondistended, + BS MS: no deformity or atrophy; no edema; distal pulses are 2+ in all 4 extremities   Skin: warm and dry,  no rash Neuro:  Strength and sensation are intact Psych: euthymic mood, full affect   EKG:  EKG is ordered today. Linda ekg ordered today demonstrates SR, RBBB (old)   Recent  Labs: 04/08/2016: TSH 1.002 09/19/2016: B Natriuretic Peptide 14.1 12/05/2016: ALT 17; Hemoglobin 12.9; Magnesium 1.7; Platelets 192 12/06/2016: BUN 10; Creatinine, Ser 0.60; Potassium 3.7; Sodium 136    Lipid Panel    Component Value Date/Time   CHOL 152 01/31/2013 0500   TRIG 92 01/31/2013 0500   HDL 44 01/31/2013 0500   CHOLHDL 3.5 01/31/2013 0500   VLDL 18 01/31/2013 0500   LDLCALC 90 01/31/2013 0500     Wt Readings from Last 3 Encounters:  12/20/16 166 lb (75.3 kg)  12/06/16 174 lb 9.7 oz (79.2 kg)  11/07/16 170 lb 6.4 oz (77.3 kg)     Other studies Reviewed: Additional studies/ records that were reviewed today include: office notes, hospital records.  ASSESSMENT AND PLAN:  1.  Chronic diastolic CHF: her home weight is 163 now. She is congratulated on achieving that and maintaining it over Christmas. Continue current therapy. Ck BMET today.  2. Recent fall with Rhabdo: last CK was 364, down from peak of 763. She now has a LifeAlert. She is on lower doses of sedating meds. Mgt per PCP but will ck a CK. If CK has normalized, would restart Lipitor.  3. Nasal dripping: advised I could send in rx for Allegra and Atrovent nasal spray. Pt aware PCP to refill if needed.   Current medicines are reviewed at length with Linda [name] today.  Linda [name] does not have concerns regarding medicines.  Linda following changes have been made:  Temp rx for Allegra, Atrovent nasal spray  Labs/ tests ordered today include:   Orders Placed This Encounter  Procedures  . CK (Creatine Kinase)  . Basic metabolic panel  . EKG 12-Lead     Disposition:   FU with Dr Stanford Breed  Signed, Rosaria Ferries, PA-C  12/20/2016 10:15 AM    [place] Phone: [phone]; Fax: [phone]  This note was written with Linda assistance of speech recognition software. Please excuse any transcriptional errors.

## 2016-12-21 LAB — BASIC METABOLIC PANEL
BUN: 15 mg/dL (ref 7–25)
CALCIUM: 8.8 mg/dL (ref 8.6–10.4)
CO2: 24 mmol/L (ref 20–31)
Chloride: 106 mmol/L (ref 98–110)
Creat: 0.76 mg/dL (ref 0.60–0.93)
GLUCOSE: 80 mg/dL (ref 65–99)
Potassium: 4 mmol/L (ref 3.5–5.3)
SODIUM: 141 mmol/L (ref 135–146)

## 2016-12-21 LAB — CK: Total CK: 65 U/L (ref 7–177)

## 2016-12-23 DIAGNOSIS — T43505A Adverse effect of unspecified antipsychotics and neuroleptics, initial encounter: Secondary | ICD-10-CM

## 2016-12-23 DIAGNOSIS — G2111 Neuroleptic induced parkinsonism: Secondary | ICD-10-CM

## 2016-12-23 HISTORY — DX: Adverse effect of unspecified antipsychotics and neuroleptics, initial encounter: T43.505A

## 2016-12-23 HISTORY — DX: Neuroleptic induced parkinsonism: G21.11

## 2016-12-26 ENCOUNTER — Encounter: Payer: Self-pay | Admitting: Registered Nurse

## 2016-12-26 ENCOUNTER — Encounter: Payer: Medicare Other | Admitting: Registered Nurse

## 2016-12-26 ENCOUNTER — Encounter: Payer: Medicare Other | Attending: Physical Medicine & Rehabilitation | Admitting: Registered Nurse

## 2016-12-26 VITALS — BP 102/66 | HR 77 | Resp 14

## 2016-12-26 DIAGNOSIS — G894 Chronic pain syndrome: Secondary | ICD-10-CM

## 2016-12-26 DIAGNOSIS — Z5181 Encounter for therapeutic drug level monitoring: Secondary | ICD-10-CM | POA: Diagnosis not present

## 2016-12-26 DIAGNOSIS — G8929 Other chronic pain: Secondary | ICD-10-CM

## 2016-12-26 DIAGNOSIS — M1712 Unilateral primary osteoarthritis, left knee: Secondary | ICD-10-CM

## 2016-12-26 DIAGNOSIS — M797 Fibromyalgia: Secondary | ICD-10-CM | POA: Diagnosis not present

## 2016-12-26 DIAGNOSIS — M545 Low back pain: Secondary | ICD-10-CM

## 2016-12-26 DIAGNOSIS — R2689 Other abnormalities of gait and mobility: Secondary | ICD-10-CM | POA: Insufficient documentation

## 2016-12-26 DIAGNOSIS — M1711 Unilateral primary osteoarthritis, right knee: Secondary | ICD-10-CM | POA: Diagnosis not present

## 2016-12-26 DIAGNOSIS — Z79899 Other long term (current) drug therapy: Secondary | ICD-10-CM | POA: Diagnosis not present

## 2016-12-26 MED ORDER — OXYCODONE HCL 10 MG PO TABS: 10.0000 mg | ORAL_TABLET | Freq: Four times a day (QID) | ORAL | 0 refills | Status: DC | PRN

## 2016-12-26 NOTE — Progress Notes (Signed)
Subjective:    Patient ID: Linda [name], female    DOB: September 14, 1946, 71 y.o.   MRN: [id number]  HPI: Linda [name] is a 71 year old female who returns for follow up appointmentfor chronic pain and medication refill. She states her pain is located in herlower backand bilateral knee pain. She rates her pain 8. Her current exercise regime is performing stretching exercises, stationary peddle bicycle for upper and lower extremities ( upper for 15 minutes) three  times a week.  Pain Inventory Average Pain 7 Pain Right Now 8 My pain is sharp and aching  In the last 24 hours, has pain interfered with the following? General activity 9 Relation with others 10 Enjoyment of life 10 What TIME of day is your pain at its worst? Morning, daytime, night Sleep (in general) Fair  Pain is worse with: walking, bending and standing Pain improves with: rest and medication Relief from Meds: n/a  Mobility use a walker how many minutes can you walk? 10 ability to climb steps?  no do you drive?  yes Do you have any goals in this area?  yes  Function disabled: date disabled 2002 retired I need assistance with the following:  dressing and shopping Do you have any goals in this area?  yes  Neuro/Psych bladder control problems weakness trouble walking dizziness anxiety  Prior Studies Any changes since last visit?  no  Physicians involved in your care Any changes since last visit?  no   Family History  Problem Relation Age of Onset  . Arthritis Mother   . Kidney disease Mother   . Heart disease Father   . Stroke Father   . Hypertension Father   . Diabetes Father   . Heart attack Father   . Heart attack Paternal Grandmother    Social History   Social History  . Marital status: Widowed    Spouse name: N/A  . Number of children: 2  . Years of education: N/A   Occupational History  . Retired    Social History Main Topics  . Smoking status: Never Smoker  .  Smokeless tobacco: Never Used     Comment: never used tobacco  . Alcohol use No  . Drug use: No  . Sexual activity: Not Currently   Other Topics Concern  . None   Social History Narrative   Widowed, 2 sons.  Relocated to [place] 09/2012 to be closer to her son who has MS.   Husband d 2015--mesothelioma.   Occupation: former Pharmacist, hospital.   Education: masters degree level.   No T/A/Ds.   Lives in [place], independent living.   Past Surgical History:  Procedure Laterality Date  . APPENDECTOMY  1960  . AXILLARY SURGERY Left 1978   Multiple "lump" in armpit per pt  . CARDIAC CATHETERIZATION  01/2013   nonobstructive CAD, EF 55-60%  . CARDIOVASCULAR STRESS TEST  02/22/15   Low risk myocard perf imaging; wall motion normal, normal EF  . COCCYX REMOVAL  1972  . DILATION AND CURETTAGE OF UTERUS  ? 1970's  . ESOPHAGOGASTRODUODENOSCOPY N/A 07/19/2014   Gastritis found + in the setting of supratherapeutic INR, +plavix, + meloxicam.  . EYE SURGERY Left 2012-2013   "injections for ~ 1 yr; don't really know what for" (07/12/2013)  . HEEL SPUR SURGERY Left 2008  . KNEE SURGERY  2005  . LEFT HEART CATHETERIZATION WITH CORONARY ANGIOGRAM N/A 01/30/2013   Procedure: LEFT HEART CATHETERIZATION WITH CORONARY ANGIOGRAM;  Surgeon:  Clent Demark, MD;  Location: [facility] CATH LAB;  Service: Cardiovascular;  Laterality: N/A;  . PLANTAR FASCIA RELEASE Left 2008  . SPIROMETRY  04/25/14   In hosp for acute asthma/COPD flare: mixed obstructive and restrictive lung disease. The FEV1 is severely reduced at 45% predicted.  FEV1 signif decreased compared to prior spirometry 07/23/13.  . TENDON RELEASE  1996   Right forearm and hand  . TOTAL ABDOMINAL HYSTERECTOMY  1974  . TRANSTHORACIC ECHOCARDIOGRAM  01/2013; 04/2014;08/2015   2014--NORMAL.  2015--focal basal septal hypertrophy, EF 55-60%, grade I diast dysfxn, mild LAE.  08/2015 EF 55-60%, nl LV syst fxn, grade I DD, valves wnl   Past Medical History:  Diagnosis Date  .  Acute upper GI bleed 06/2014   while pt taking coumadin, plavix, and meloxicam---despite being told not to take coumadin.  . Anginal pain ([place])    Nonobstructive CAD 2014; however, her cardiologist put her on a statin for this and NOT for hyperlipidemia per pt report.  . Anxiety    panic attacks  . Asthma    w/ asbestososis   . BPPV (benign paroxysmal positional vertigo) 12/16/2012  . Chronic diastolic CHF (congestive heart failure) (HCC)    dry wt as of 11/06/16 is 168 lbs.  . Chronic lower back pain   . COPD (chronic obstructive pulmonary disease) ([place])   . DDD (degenerative disc disease)    lumbar and cervical.   . Depression   . Diverticular disease   . Fibromyalgia    Patient states dx was around her late 70s but she had sx's for years prior to this.  . H/O hiatal hernia   . Hay fever   . History of pneumonia    hospitalized 12/2011, 02/2013, and 07/2013 [facility]) for this  . HTN (hypertension)   . Idiopathic angio-edema-urticaria 72014   Angioedema component was very minimal  . Insomnia   . Iron deficiency anemia    Hematologist in [place] did extensive w/u; no cause found; failed oral supplement;; gets fairly regular (q28m or so) IV iron infusions (Venofer -iron sucrose- 200mg  with procrit.  "for 14 yr I've been getting blood work q month & getting infusions prn" (07/12/2013).  Dr. Marin Olp locally, iron infusions done, EPO deficiency dx'd  . Migraine syndrome    "not as often anymore; used to be ~ q wk" (07/12/2013)  . Mixed incontinence urge and stress   . Nephrolithiasis    "passed all on my own or they are still in there" (07/12/2013)  . OSA on CPAP    prior to move to [place]--had another sleep study 10/2015 w/pulm Dr. Camillo Flaming.  . Osteoarthritis    "severe; progressing fast" (07/12/2013); multiple joints-not surgical candidate for TKR (03/2015)  . Pernicious anemia 08/24/2014  . Pleural plaque with presence of asbestos 07/22/2013  . Pulmonary embolism ([place]) 07/2013   Dx at [facility] with  very small peripheral upper lobe pe 07/2013: pt took coumadin for about 8-9 mo  . Pyelonephritis    "several times over the last 30 yr" (07/12/2013)  . RBBB (right bundle branch block)   . Recurrent UTI    hx of hospitalization for pyelonephritis; started abx prophylaxis 06/2015  . Syncope    Hypotensive; ED visit--Dr. Terrence Dupont did Cath--nonobstructive CAD, EF 55-60%.  In retrospect, suspect pt rx med misuse/polypharmacy  . Tension headache, chronic    BP 102/66   Pulse 77   Resp 14   SpO2 94%   Opioid Risk Score:  Fall Risk Score:  `1  Depression screen PHQ 2/9  Depression screen [facility] 2/9 08/01/2016 06/12/2016 04/19/2016 08/11/2015 07/12/2015 03/27/2015  Decreased Interest 3 1 0 3 0 3  Down, Depressed, Hopeless 3 0 0 3 1 3   PHQ - 2 Score 6 1 0 6 1 6   Altered sleeping - - - 2 - 2  Tired, decreased energy - - - 3 - 3  Change in appetite - - - 0 - 2  Feeling bad or failure about yourself  - - - 2 - 2  Trouble concentrating - - - 0 - 2  Moving slowly or fidgety/restless - - - 0 - 0  Suicidal thoughts - - - 0 - 0  PHQ-9 Score - - - 13 - 17  Some recent data might be hidden     Review of Systems  Constitutional: Negative.   HENT: Negative.   Eyes: Negative.   Respiratory: Positive for shortness of breath.   Cardiovascular: Negative.   Gastrointestinal: Positive for nausea.  Endocrine: Negative.   Genitourinary: Negative.   Musculoskeletal:       Limb swelling  Skin: Negative.   Allergic/Immunologic: Negative.   Neurological: Negative.   Hematological: Negative.   Psychiatric/Behavioral: Negative.   All other systems reviewed and are negative.      Objective:   Physical Exam  Constitutional: She is oriented to person, place, and time. She appears well-developed and well-nourished.  HENT:  Head: Normocephalic and atraumatic.  Neck: Normal range of motion. Neck supple.  Cardiovascular: Normal rate and regular rhythm.   Pulmonary/Chest: Effort normal and breath sounds  normal.  Continuous Oxygen 3 liters nasal cannula  Musculoskeletal:  Normal Muscle Bulk and Muscle Testing Reveals: Upper Extremities: Full ROM and Muscle Strength 5/5 Thoracic Paraspinal Tenderness: T-1-T-3 Lumbar Paraspinal Tenderness: L-3-L-5 Lower Extremities: Full ROM and Muscle Strength 5/5 Bilateral Lower Extremities Flexion Produces Pain into Bilateral Patella's Arises from chair slowly using walker for support Narrow Based gait  Neurological: She is alert and oriented to person, place, and time.  Skin: Skin is warm and dry.  Psychiatric: She has a normal mood and affect.  Nursing note and vitals reviewed.         Assessment & Plan:  1. Functional deficits secondary to Gait disorder: Continue with HEP. 2. Chronic Back pain/ Lumbar Radiculitis/fibromyalgia/R>L Knee OA Pain Management:  Refilled: Oxycodone 10 mgone tablet every 6 hours #100/ Continue Lyrica and Voltaren Gel We will continue the opioid monitoring program, this consists of regular clinic visits, examinations, urine drug screen, pill counts as well as use of [place] Controlled Substance reporting System. 3. Depression with anxiety/Grief reaction/Mood: Continue Xanax, Trazodone and Cymbalta .  4. [name] with asthma: Oxygen dependent. Albuterol prn.  On Continuous Oxygen Therapy: Wearing 3 liters [id number] Following. 5. Bilateral Osteoarthritis of Bilateral Knees: Dr. Juanda Bond Following. 6. Fibromyalgia: Continue Lyrica andexercise regime.  20 minutes of face to face patient care time was spent during this visit. All questions were encouraged and answered.  F/U in 1 month

## 2016-12-27 ENCOUNTER — Other Ambulatory Visit: Payer: Self-pay | Admitting: Hematology & Oncology

## 2016-12-27 DIAGNOSIS — J61 Pneumoconiosis due to asbestos and other mineral fibers: Secondary | ICD-10-CM | POA: Diagnosis not present

## 2016-12-27 DIAGNOSIS — I110 Hypertensive heart disease with heart failure: Secondary | ICD-10-CM | POA: Diagnosis not present

## 2016-12-27 DIAGNOSIS — F329 Major depressive disorder, single episode, unspecified: Secondary | ICD-10-CM | POA: Diagnosis not present

## 2016-12-27 DIAGNOSIS — I5032 Chronic diastolic (congestive) heart failure: Secondary | ICD-10-CM | POA: Diagnosis not present

## 2016-12-27 DIAGNOSIS — M797 Fibromyalgia: Secondary | ICD-10-CM | POA: Diagnosis not present

## 2016-12-27 DIAGNOSIS — M6282 Rhabdomyolysis: Secondary | ICD-10-CM | POA: Diagnosis not present

## 2017-01-01 DIAGNOSIS — J61 Pneumoconiosis due to asbestos and other mineral fibers: Secondary | ICD-10-CM | POA: Diagnosis not present

## 2017-01-01 DIAGNOSIS — M797 Fibromyalgia: Secondary | ICD-10-CM | POA: Diagnosis not present

## 2017-01-01 DIAGNOSIS — I5032 Chronic diastolic (congestive) heart failure: Secondary | ICD-10-CM | POA: Diagnosis not present

## 2017-01-01 DIAGNOSIS — M6282 Rhabdomyolysis: Secondary | ICD-10-CM | POA: Diagnosis not present

## 2017-01-01 DIAGNOSIS — I110 Hypertensive heart disease with heart failure: Secondary | ICD-10-CM | POA: Diagnosis not present

## 2017-01-01 DIAGNOSIS — F329 Major depressive disorder, single episode, unspecified: Secondary | ICD-10-CM | POA: Diagnosis not present

## 2017-01-09 ENCOUNTER — Telehealth: Payer: Self-pay | Admitting: Registered Nurse

## 2017-01-09 NOTE — Telephone Encounter (Signed)
On January 18,2018 NCCSR was reviewed: No conflict was seen on the [place] with Multiple Prescribers. Linda [name] has a signed  Narcotic Contract with our office. If there were any discrepancies this would have been  reported to her  Physcian.

## 2017-01-15 ENCOUNTER — Telehealth: Payer: Self-pay | Admitting: *Deleted

## 2017-01-15 DIAGNOSIS — M6282 Rhabdomyolysis: Secondary | ICD-10-CM | POA: Diagnosis not present

## 2017-01-15 DIAGNOSIS — I5032 Chronic diastolic (congestive) heart failure: Secondary | ICD-10-CM | POA: Diagnosis not present

## 2017-01-15 DIAGNOSIS — M797 Fibromyalgia: Secondary | ICD-10-CM | POA: Diagnosis not present

## 2017-01-15 DIAGNOSIS — I110 Hypertensive heart disease with heart failure: Secondary | ICD-10-CM | POA: Diagnosis not present

## 2017-01-15 DIAGNOSIS — F329 Major depressive disorder, single episode, unspecified: Secondary | ICD-10-CM | POA: Diagnosis not present

## 2017-01-15 DIAGNOSIS — J61 Pneumoconiosis due to asbestos and other mineral fibers: Secondary | ICD-10-CM | POA: Diagnosis not present

## 2017-01-15 NOTE — Telephone Encounter (Signed)
Linda [name] with [facility] LMOM on 01/15/17 at 12:35pm stating that pt fell over the weekend in her bathroom and hit the back of her head. Pt refused to go to the ER. She stated that there is a little lump there but pt denied blurry vision and headaches.

## 2017-01-15 NOTE — Telephone Encounter (Signed)
Noted  

## 2017-01-28 NOTE — Progress Notes (Signed)
HPI: FU diastolic congestive heart failure. Cardiac catheterization February 2014 showed nonobstructive disease (most significant 50-60 RCA) and normal LV function; note of 4+ MR. Renal Dopplers May 2014 showed no stenosis. Nuclear study March 2016 showed ejection fraction 96% and no ischemia or infarction. Also with history of presumed orthostatic syncope. Last echocardiogram June 2017 showed normal LV systolic function and grade 1 diastolic dysfunction. She has had problems with diastolic CHF. Since last seen, she denies dyspnea, chest pain, palpitations, syncope or pedal edema. No dizziness.  Current Outpatient Prescriptions  Medication Sig Dispense Refill  . acetaminophen (TYLENOL) 325 MG tablet Take 2 tablets (650 mg total) by mouth every 4 (four) hours as needed for headache or mild pain.    [name] Kitchen albuterol (PROVENTIL HFA;VENTOLIN HFA) 108 (90 BASE) MCG/ACT inhaler Inhale 2 puffs into the lungs every 6 (six) hours as needed for wheezing or shortness of breath. 1 Inhaler 1  . ALPRAZolam (XANAX) 1 MG tablet Take 1 mg by mouth 3 (three) times daily as needed for anxiety.    . ARIPiprazole (ABILIFY) 10 MG tablet TAKE 1 TABLET ONCE DAILY. 90 tablet 3  . aspirin 81 MG tablet Take 81 mg by mouth at bedtime.    [name] Kitchen atorvastatin (LIPITOR) 20 MG tablet Take 1 tablet by mouth at bedtime.    . budesonide-formoterol (SYMBICORT) 160-4.5 MCG/ACT inhaler Inhale 2 puffs into the lungs 2 (two) times daily. 1 Inhaler 12  . Cholecalciferol (VITAMIN D3) 50000 units CAPS Take 1 capsule by mouth every Monday.     . diclofenac sodium (VOLTAREN) 1 % GEL Apply 2 g topically 3 (three) times daily as needed (pain). 2 Tube 2  . DULoxetine (CYMBALTA) 30 MG capsule Take 30 mg by mouth at bedtime.    . DULoxetine (CYMBALTA) 60 MG capsule Take 1 capsule (60 mg total) by mouth at bedtime. 10 capsule 0  . folic acid (FOLVITE) 1 MG tablet Take 2 tablets (2 mg total) by mouth daily. (Patient taking differently: Take 1 mg by  mouth daily. ) 60 tablet 6  . furosemide (LASIX) 40 MG tablet Take 1 tablet (40 mg total) by mouth daily. 60 tablet 11  . ibuprofen (ADVIL,MOTRIN) 400 MG tablet Take 1 tablet (400 mg total) by mouth every 6 (six) hours as needed for moderate pain. 30 tablet 0  . ipratropium (ATROVENT) 0.03 % nasal spray Place 2 sprays into both nostrils every 12 (twelve) hours. Contact pcp for future refills 30 mL 0  . isosorbide mononitrate (IMDUR) 30 MG 24 hr tablet TAKE 1 TABLET ONCE DAILY. 30 tablet 6  . LYRICA 300 MG capsule TAKE (1) CAPSULE TWICE DAILY. 60 capsule 0  . metoprolol succinate (TOPROL XL) 25 MG 24 hr tablet Take 1 tablet (25 mg total) by mouth 2 (two) times daily. 60 tablet 11  . Multiple Vitamin (MULTIVITAMIN WITH MINERALS) TABS tablet Take 1 tablet by mouth daily.    . nitroGLYCERIN (NITROSTAT) 0.4 MG SL tablet Place 0.4 mg under the tongue every 5 (five) minutes as needed for chest pain (x 3 doses).     . Oxycodone HCl 10 MG TABS Take 1 tablet (10 mg total) by mouth every 6 (six) hours as needed. 100 tablet 0  . OXYGEN Inhale 3 L into the lungs continuous.    . pantoprazole (PROTONIX) 40 MG tablet Take 40 mg by mouth daily.    . polyethylene glycol (MIRALAX / GLYCOLAX) packet Take 17 g by mouth daily as needed (constipation).  Mix in 8 oz liquid and drink    . potassium chloride SA (K-DUR,KLOR-CON) 20 MEQ tablet Take 2 tablets (40 mEq total) by mouth daily. 120 tablet 11  . traZODone (DESYREL) 50 MG tablet Take 2-4 tablets by mouth at bedtime.     No current facility-administered medications for this visit.    Facility-Administered Medications Ordered in Other Visits  Medication Dose Route Frequency Provider Last Rate Last Dose  . ferumoxytol (FERAHEME) 510 mg in sodium chloride 0.9 % 100 mL IVPB  510 mg Intravenous Once Volanda Napoleon, MD         Past Medical History:  Diagnosis Date  . Acute upper GI bleed 06/2014   while pt taking coumadin, plavix, and meloxicam---despite being told  not to take coumadin.  . Anginal pain ([place])    Nonobstructive CAD 2014; however, her cardiologist put her on a statin for this and NOT for hyperlipidemia per pt report.  . Anxiety    panic attacks  . Asthma    w/ asbestososis   . BPPV (benign paroxysmal positional vertigo) 12/16/2012  . Chronic diastolic CHF (congestive heart failure) (HCC)    dry wt as of 11/06/16 is 168 lbs.  . Chronic lower back pain   . COPD (chronic obstructive pulmonary disease) ([place])   . DDD (degenerative disc disease)    lumbar and cervical.   . Depression   . Diverticular disease   . Fibromyalgia    Patient states dx was around her late 49s but she had sx's for years prior to this.  . H/O hiatal hernia   . Hay fever   . History of pneumonia    hospitalized 12/2011, 02/2013, and 07/2013 [facility]) for this  . HTN (hypertension)   . Idiopathic angio-edema-urticaria 72014   Angioedema component was very minimal  . Insomnia   . Iron deficiency anemia    Hematologist in [place], [place] did extensive w/u; no cause found; failed oral supplement;; gets fairly regular (q2m or so) IV iron infusions (Venofer -iron sucrose- 200mg  with procrit.  "for 14 yr I've been getting blood work q month & getting infusions prn" (07/12/2013).  Dr. Marin Olp locally, iron infusions done, EPO deficiency dx'd  . Migraine syndrome    "not as often anymore; used to be ~ q wk" (07/12/2013)  . Mixed incontinence urge and stress   . Nephrolithiasis    "passed all on my own or they are still in there" (07/12/2013)  . OSA on CPAP    prior to move to [place]--had another sleep study 10/2015 w/pulm Dr. Camillo Flaming.  . Osteoarthritis    "severe; progressing fast" (07/12/2013); multiple joints-not surgical candidate for TKR (03/2015)  . Pernicious anemia 08/24/2014  . Pleural plaque with presence of asbestos 07/22/2013  . Pulmonary embolism ([place]) 07/2013   Dx at [facility] with very small peripheral upper lobe pe 07/2013: pt took coumadin for about 8-9 mo  . Pyelonephritis     "several times over the last 30 yr" (07/12/2013)  . RBBB (right bundle branch block)   . Recurrent UTI    hx of hospitalization for pyelonephritis; started abx prophylaxis 06/2015  . Syncope    Hypotensive; ED visit--Dr. Terrence Dupont did Cath--nonobstructive CAD, EF 55-60%.  In retrospect, suspect pt rx med misuse/polypharmacy  . Tension headache, chronic     Past Surgical History:  Procedure Laterality Date  . APPENDECTOMY  1960  . AXILLARY SURGERY Left 1978   Multiple "lump" in armpit per pt  . CARDIAC CATHETERIZATION  01/2013   nonobstructive CAD, EF 55-60%  . CARDIOVASCULAR STRESS TEST  02/22/15   Low risk myocard perf imaging; wall motion normal, normal EF  . COCCYX REMOVAL  1972  . DILATION AND CURETTAGE OF UTERUS  ? 1970's  . ESOPHAGOGASTRODUODENOSCOPY N/A 07/19/2014   Gastritis found + in the setting of supratherapeutic INR, +plavix, + meloxicam.  . EYE SURGERY Left 2012-2013   "injections for ~ 1 yr; don't really know what for" (07/12/2013)  . HEEL SPUR SURGERY Left 2008  . KNEE SURGERY  2005  . LEFT HEART CATHETERIZATION WITH CORONARY ANGIOGRAM N/A 01/30/2013   Procedure: LEFT HEART CATHETERIZATION WITH CORONARY ANGIOGRAM;  Surgeon: Clent Demark, MD;  Location: [facility] CATH LAB;  Service: Cardiovascular;  Laterality: N/A;  . PLANTAR FASCIA RELEASE Left 2008  . SPIROMETRY  04/25/14   In hosp for acute asthma/COPD flare: mixed obstructive and restrictive lung disease. The FEV1 is severely reduced at 45% predicted.  FEV1 signif decreased compared to prior spirometry 07/23/13.  . TENDON RELEASE  1996   Right forearm and hand  . TOTAL ABDOMINAL HYSTERECTOMY  1974  . TRANSTHORACIC ECHOCARDIOGRAM  01/2013; 04/2014;08/2015   2014--NORMAL.  2015--focal basal septal hypertrophy, EF 55-60%, grade I diast dysfxn, mild LAE.  08/2015 EF 55-60%, nl LV syst fxn, grade I DD, valves wnl    Social History   Social History  . Marital status: Widowed    Spouse name: N/A  . Number of children: 2  . Years of  education: N/A   Occupational History  . Retired    Social History Main Topics  . Smoking status: Never Smoker  . Smokeless tobacco: Never Used     Comment: never used tobacco  . Alcohol use No  . Drug use: No  . Sexual activity: Not Currently   Other Topics Concern  . Not on file   Social History Narrative   Widowed, 2 sons.  Relocated to [place] 09/2012 to be closer to her son who has MS.   Husband d 2015--mesothelioma.   Occupation: former Pharmacist, hospital.   Education: masters degree level.   No T/A/Ds.   Lives in [place], independent living.    Family History  Problem Relation Age of Onset  . Arthritis Mother   . Kidney disease Mother   . Heart disease Father   . Stroke Father   . Hypertension Father   . Diabetes Father   . Heart attack Father   . Heart attack Paternal Grandmother     ROS: Problems with sinus buy no fevers or chills, productive cough, hemoptysis, dysphasia, odynophagia, melena, hematochezia, dysuria, hematuria, rash, seizure activity, orthopnea, PND, pedal edema, claudication. Remaining systems are negative.  Physical Exam: Well-developed chronically ill appearing in no acute distress.  Skin is warm and dry.  HEENT is normal.  Neck is supple.  Chest is clear to auscultation with normal expansion.  Cardiovascular exam is regular rate and rhythm.  Abdominal exam nontender or distended. No masses palpated. Extremities show no edema. neuro grossly intact  A/P  1 Chronic diastolic congestive heart failure- patient is euvolemic on examination. Her weight has decreased to 154. I will check potassium and renal function to make sure she is not becoming dehydrated. Otherwise continue present regimen. She does state she does not have much of an appetite.  2 coronary artery disease-continue aspirin and statin.  3 hypertension-blood pressure controlled. Continue present medications.  4 hyperlipidemia-continue statin.  5 chest pain-no recent  symptoms.  [name], MD

## 2017-02-03 ENCOUNTER — Ambulatory Visit (INDEPENDENT_AMBULATORY_CARE_PROVIDER_SITE_OTHER): Payer: Medicare Other | Admitting: Cardiology

## 2017-02-03 ENCOUNTER — Encounter: Payer: Self-pay | Admitting: Cardiology

## 2017-02-03 VITALS — BP 106/63 | HR 83 | Ht 64.0 in | Wt 154.6 lb

## 2017-02-03 DIAGNOSIS — I509 Heart failure, unspecified: Secondary | ICD-10-CM | POA: Diagnosis not present

## 2017-02-03 DIAGNOSIS — I10 Essential (primary) hypertension: Secondary | ICD-10-CM | POA: Diagnosis not present

## 2017-02-03 DIAGNOSIS — I2510 Atherosclerotic heart disease of native coronary artery without angina pectoris: Secondary | ICD-10-CM

## 2017-02-03 DIAGNOSIS — E7800 Pure hypercholesterolemia, unspecified: Secondary | ICD-10-CM

## 2017-02-03 LAB — BASIC METABOLIC PANEL
BUN: 9 mg/dL (ref 7–25)
CHLORIDE: 100 mmol/L (ref 98–110)
CO2: 31 mmol/L (ref 20–31)
Calcium: 9.4 mg/dL (ref 8.6–10.4)
Creat: 0.87 mg/dL (ref 0.60–0.93)
Glucose, Bld: 88 mg/dL (ref 65–99)
POTASSIUM: 3.3 mmol/L — AB (ref 3.5–5.3)
SODIUM: 141 mmol/L (ref 135–146)

## 2017-02-03 NOTE — Patient Instructions (Signed)
Medication Instructions:   NO CHANGE  Labwork:  Your physician recommends that you HAVE LAB WORK TODAY  Follow-Up:  Your physician recommends that you schedule a follow-up appointment in: [place] wants you to follow-up in: [place] will receive a reminder letter in the mail two months in advance. If you don't receive a letter, please call our office to schedule the follow-up appointment.    If you need a refill on your cardiac medications before your next appointment, please call your pharmacy.

## 2017-02-04 ENCOUNTER — Telehealth: Payer: Self-pay | Admitting: *Deleted

## 2017-02-04 ENCOUNTER — Ambulatory Visit: Payer: Medicare Other | Admitting: Physical Medicine & Rehabilitation

## 2017-02-04 DIAGNOSIS — E876 Hypokalemia: Secondary | ICD-10-CM

## 2017-02-04 NOTE — Telephone Encounter (Signed)
Advised patient of lab results, med change, and to recheck labs in 2 weeks. Verbalized understanding

## 2017-02-04 NOTE — Telephone Encounter (Signed)
-----   Message from Lelon Perla, MD sent at 02/04/2017  7:17 AM EST ----- Make sure pt taking kcl 40 meq daily; if so increase to 40 in AM and 20 in Pm with BMET 2 weeks [name]

## 2017-02-05 ENCOUNTER — Ambulatory Visit: Payer: Medicare Other | Admitting: Physical Medicine & Rehabilitation

## 2017-02-10 ENCOUNTER — Encounter: Payer: Self-pay | Admitting: Physical Medicine & Rehabilitation

## 2017-02-10 ENCOUNTER — Encounter: Payer: Medicare Other | Attending: Physical Medicine & Rehabilitation | Admitting: Physical Medicine & Rehabilitation

## 2017-02-10 VITALS — BP 96/63 | HR 89 | Resp 14

## 2017-02-10 DIAGNOSIS — I2510 Atherosclerotic heart disease of native coronary artery without angina pectoris: Secondary | ICD-10-CM

## 2017-02-10 DIAGNOSIS — M797 Fibromyalgia: Secondary | ICD-10-CM | POA: Insufficient documentation

## 2017-02-10 DIAGNOSIS — R2689 Other abnormalities of gait and mobility: Secondary | ICD-10-CM | POA: Insufficient documentation

## 2017-02-10 DIAGNOSIS — M5416 Radiculopathy, lumbar region: Secondary | ICD-10-CM | POA: Insufficient documentation

## 2017-02-10 DIAGNOSIS — M75101 Unspecified rotator cuff tear or rupture of right shoulder, not specified as traumatic: Secondary | ICD-10-CM | POA: Diagnosis not present

## 2017-02-10 DIAGNOSIS — Z5181 Encounter for therapeutic drug level monitoring: Secondary | ICD-10-CM | POA: Insufficient documentation

## 2017-02-10 DIAGNOSIS — M1711 Unilateral primary osteoarthritis, right knee: Secondary | ICD-10-CM | POA: Diagnosis not present

## 2017-02-10 DIAGNOSIS — M1712 Unilateral primary osteoarthritis, left knee: Secondary | ICD-10-CM | POA: Diagnosis not present

## 2017-02-10 DIAGNOSIS — Z79899 Other long term (current) drug therapy: Secondary | ICD-10-CM | POA: Insufficient documentation

## 2017-02-10 DIAGNOSIS — R0981 Nasal congestion: Secondary | ICD-10-CM | POA: Diagnosis not present

## 2017-02-10 MED ORDER — OXYMETAZOLINE HCL 0.05 % NA SOLN: 1.0000 | Freq: Two times a day (BID) | NASAL | 2 refills | Status: DC

## 2017-02-10 MED ORDER — OXYCODONE-ACETAMINOPHEN 10-325 MG PO TABS: 1.0000 | ORAL_TABLET | Freq: Four times a day (QID) | ORAL | 0 refills | Status: DC | PRN

## 2017-02-10 NOTE — Patient Instructions (Signed)
CONTINUE TO FOCUS ON SAFETY AT HOME.    PLEASE FEEL FREE TO CALL OUR OFFICE WITH ANY PROBLEMS OR QUESTIONS [id number])

## 2017-02-10 NOTE — Progress Notes (Signed)
Subjective:    Patient ID: Linda [name], female    DOB: 05/21/1946, 71 y.o.   MRN: [id number]  HPI   Linda [name] is here in follow up of her chronic pain and gait disorder. She has been struggling for the last few weeks with a sinus congestion and post nasal drip. This has held her back from a mobility standpoint. She had a fall in December which resulted in a hospitalization in December with a diagnosis of rhabdomyolysis. She hasn't had any other falls since then and feels that it was a "freak" incident. HH PT came to the house and she has become a lot more better with her judgment and safety awareness.   She felt that the percocet was more effective than the oxycodone. I switched her to oxycodone last year. She asked if we could go back to the percocet again.   Pain Inventory Average Pain 7 Pain Right Now 6 My pain is constant, sharp and aching  In the last 24 hours, has pain interfered with the following? General activity 7 Relation with others 7 Enjoyment of life 8 What TIME of day is your pain at its worst? Daytime,evening Sleep (in general) Fair  Pain is worse with: walking, bending, standing and some activites Pain improves with: rest and medication Relief from Meds: 6  Mobility use a walker how many minutes can you walk? 10 ability to climb steps?  no do you drive?  yes transfers alone  Function retired I need assistance with the following:  household duties and shopping  Neuro/Psych bladder control problems trouble walking anxiety  Prior Studies Any changes since last visit?  no  Physicians involved in your care Any changes since last visit?  no   Family History  Problem Relation Age of Onset  . Arthritis Mother   . Kidney disease Mother   . Heart disease Father   . Stroke Father   . Hypertension Father   . Diabetes Father   . Heart attack Father   . Heart attack Paternal Grandmother    Social History   Social History  . Marital status:  Widowed    Spouse name: N/A  . Number of children: 2  . Years of education: N/A   Occupational History  . Retired    Social History Main Topics  . Smoking status: Never Smoker  . Smokeless tobacco: Never Used     Comment: never used tobacco  . Alcohol use No  . Drug use: No  . Sexual activity: Not Currently   Other Topics Concern  . None   Social History Narrative   Widowed, 2 sons.  Relocated to [place] 09/2012 to be closer to her son who has MS.   Husband d 2015--mesothelioma.   Occupation: former Pharmacist, hospital.   Education: masters degree level.   No T/A/Ds.   Lives in [place], independent living.   Past Surgical History:  Procedure Laterality Date  . APPENDECTOMY  1960  . AXILLARY SURGERY Left 1978   Multiple "lump" in armpit per pt  . CARDIAC CATHETERIZATION  01/2013   nonobstructive CAD, EF 55-60%  . CARDIOVASCULAR STRESS TEST  02/22/15   Low risk myocard perf imaging; wall motion normal, normal EF  . COCCYX REMOVAL  1972  . DILATION AND CURETTAGE OF UTERUS  ? 1970's  . ESOPHAGOGASTRODUODENOSCOPY N/A 07/19/2014   Gastritis found + in the setting of supratherapeutic INR, +plavix, + meloxicam.  . EYE SURGERY Left 2012-2013   "injections for ~ 1  yr; don't really know what for" (07/12/2013)  . HEEL SPUR SURGERY Left 2008  . KNEE SURGERY  2005  . LEFT HEART CATHETERIZATION WITH CORONARY ANGIOGRAM N/A 01/30/2013   Procedure: LEFT HEART CATHETERIZATION WITH CORONARY ANGIOGRAM;  Surgeon: Clent Demark, MD;  Location: [facility] CATH LAB;  Service: Cardiovascular;  Laterality: N/A;  . PLANTAR FASCIA RELEASE Left 2008  . SPIROMETRY  04/25/14   In hosp for acute asthma/COPD flare: mixed obstructive and restrictive lung disease. The FEV1 is severely reduced at 45% predicted.  FEV1 signif decreased compared to prior spirometry 07/23/13.  . TENDON RELEASE  1996   Right forearm and hand  . TOTAL ABDOMINAL HYSTERECTOMY  1974  . TRANSTHORACIC ECHOCARDIOGRAM  01/2013; 04/2014;08/2015   2014--NORMAL.   2015--focal basal septal hypertrophy, EF 55-60%, grade I diast dysfxn, mild LAE.  08/2015 EF 55-60%, nl LV syst fxn, grade I DD, valves wnl   Past Medical History:  Diagnosis Date  . Acute upper GI bleed 06/2014   while pt taking coumadin, plavix, and meloxicam---despite being told not to take coumadin.  . Anginal pain ([place])    Nonobstructive CAD 2014; however, her cardiologist put her on a statin for this and NOT for hyperlipidemia per pt report.  . Anxiety    panic attacks  . Asthma    w/ asbestososis   . BPPV (benign paroxysmal positional vertigo) 12/16/2012  . Chronic diastolic CHF (congestive heart failure) (HCC)    dry wt as of 11/06/16 is 168 lbs.  . Chronic lower back pain   . COPD (chronic obstructive pulmonary disease) ([place])   . DDD (degenerative disc disease)    lumbar and cervical.   . Depression   . Diverticular disease   . Fibromyalgia    Patient states dx was around her late 43s but she had sx's for years prior to this.  . H/O hiatal hernia   . Hay fever   . History of pneumonia    hospitalized 12/2011, 02/2013, and 07/2013 [facility]) for this  . HTN (hypertension)   . Idiopathic angio-edema-urticaria 72014   Angioedema component was very minimal  . Insomnia   . Iron deficiency anemia    Hematologist in [place], [place] did extensive w/u; no cause found; failed oral supplement;; gets fairly regular (q62m or so) IV iron infusions (Venofer -iron sucrose- 200mg  with procrit.  "for 14 yr I've been getting blood work q month & getting infusions prn" (07/12/2013).  Dr. Marin Olp locally, iron infusions done, EPO deficiency dx'd  . Migraine syndrome    "not as often anymore; used to be ~ q wk" (07/12/2013)  . Mixed incontinence urge and stress   . Nephrolithiasis    "passed all on my own or they are still in there" (07/12/2013)  . OSA on CPAP    prior to move to --had another sleep study 10/2015 w/pulm Dr. Camillo Flaming.  . Osteoarthritis    "severe; progressing fast" (07/12/2013); multiple  joints-not surgical candidate for TKR (03/2015)  . Pernicious anemia 08/24/2014  . Pleural plaque with presence of asbestos 07/22/2013  . Pulmonary embolism ([place]) 07/2013   Dx at [facility] with very small peripheral upper lobe pe 07/2013: pt took coumadin for about 8-9 mo  . Pyelonephritis    "several times over the last 30 yr" (07/12/2013)  . RBBB (right bundle branch block)   . Recurrent UTI    hx of hospitalization for pyelonephritis; started abx prophylaxis 06/2015  . Syncope    Hypotensive; ED visit--Dr. Terrence Dupont did  Cath--nonobstructive CAD, EF 55-60%.  In retrospect, suspect pt rx med misuse/polypharmacy  . Tension headache, chronic    BP 96/63   Pulse 89   Resp 14   SpO2 92%   Opioid Risk Score:   Fall Risk Score:  `1  Depression screen PHQ 2/9  Depression screen [facility] 2/9 08/01/2016 06/12/2016 04/19/2016 08/11/2015 07/12/2015 03/27/2015  Decreased Interest 3 1 0 3 0 3  Down, Depressed, Hopeless 3 0 0 3 1 3   PHQ - 2 Score 6 1 0 6 1 6   Altered sleeping - - - 2 - 2  Tired, decreased energy - - - 3 - 3  Change in appetite - - - 0 - 2  Feeling bad or failure about yourself  - - - 2 - 2  Trouble concentrating - - - 0 - 2  Moving slowly or fidgety/restless - - - 0 - 0  Suicidal thoughts - - - 0 - 0  PHQ-9 Score - - - 13 - 17  Some recent data might be hidden      Review of Systems  Constitutional: Positive for appetite change and unexpected weight change.  HENT: Negative.   Eyes: Negative.   Respiratory: Positive for shortness of breath.   Cardiovascular: Negative.   Gastrointestinal: Positive for nausea.  Endocrine: Negative.   Genitourinary: Negative.   Musculoskeletal: Negative.   Skin: Negative.   Allergic/Immunologic: Negative.   Neurological: Negative.   Hematological: Negative.   Psychiatric/Behavioral: Negative.   All other systems reviewed and are negative.      Objective:   Physical Exam Constitutional: She is oriented to person, place, and time. She appears  well-developed and well-nourished.  HENT:  Head: Normocephalic and atraumatic.  Neck: Normal range of motion. Neck supple.  Cardiovascular: RRR   Pulmonary/Chest: regular effort.  Musculoskeletal:  Normal Muscle Bulk and Muscle Testing Reveals: Upper motor strength 5/5. LE 4/5 HF to ADF/PF. Generalize thoraclumbar tenderness to palpation Arises fairly easily from chair. Utilizes walker effectively. Fair standing/walking mechanics Neurological: She is alert and oriented to person, place, and time.  Skin: Skin is warm and dry.  Psychiatric: flat but pleasant, cooperative.           Assessment & Plan:  1. Functional deficits secondary to Gait disorder:  -spoke to the patient at length regarding home safety  -I believe she's doing a better job at this point  -her balance is functional with her walker  -she is handling her meds safely in my opinion as well. 2. Chronic Back pain/ Lumbar Radiculitis/fibromyalgia/R>L Knee OA Pain Management:  Changed oxycodone 10mg  to percocet 10 q6 prn #100.  Continue Lyrica and Voltaren Gel We will continue the opioid monitoring program, this consists of regular clinic visits, examinations, urine drug screen, pill counts as well as use of [place] Controlled Substance Reporting System.  3. Depression with anxiety/Grief reaction/Mood: Continue Xanax,Trazodone and Cymbalta. She remains flat but has improved.  4. [name] with asthma: Oxygen dependent. Albuterol prn.  On Continuous Oxygen Therapy: Wearing 3 liters [id number] Following. 5. Bilateral Osteoarthritis of Bilateral Knees: Dr. Juanda Bond Following. 6. Fibromyalgia: Continue Lyrica.   -exercise regimen was discussed again today. 7. Sinus congestion/post nasal drip: 5 weeks duration  -afrin nasal spray  -netti pot  15 minutes of face to face patient care time was spent during this visit. All questions were encouraged and answered.  F/U in 1 month

## 2017-02-25 ENCOUNTER — Other Ambulatory Visit: Payer: Self-pay | Admitting: Family Medicine

## 2017-02-26 NOTE — Telephone Encounter (Signed)
[employer].  RF request for panotprazole LOV: 10/13/16 Next ov: None Last written: 08/13/16 #90 w/ 1RF

## 2017-03-10 ENCOUNTER — Encounter: Payer: Self-pay | Admitting: Physical Medicine & Rehabilitation

## 2017-03-10 ENCOUNTER — Encounter: Payer: Medicare Other | Attending: Physical Medicine & Rehabilitation | Admitting: Physical Medicine & Rehabilitation

## 2017-03-10 VITALS — BP 97/61 | HR 82

## 2017-03-10 DIAGNOSIS — I2510 Atherosclerotic heart disease of native coronary artery without angina pectoris: Secondary | ICD-10-CM | POA: Diagnosis not present

## 2017-03-10 DIAGNOSIS — Z79899 Other long term (current) drug therapy: Secondary | ICD-10-CM | POA: Insufficient documentation

## 2017-03-10 DIAGNOSIS — R2689 Other abnormalities of gait and mobility: Secondary | ICD-10-CM | POA: Diagnosis not present

## 2017-03-10 DIAGNOSIS — M5416 Radiculopathy, lumbar region: Secondary | ICD-10-CM

## 2017-03-10 DIAGNOSIS — M75101 Unspecified rotator cuff tear or rupture of right shoulder, not specified as traumatic: Secondary | ICD-10-CM

## 2017-03-10 DIAGNOSIS — M797 Fibromyalgia: Secondary | ICD-10-CM | POA: Insufficient documentation

## 2017-03-10 DIAGNOSIS — M1711 Unilateral primary osteoarthritis, right knee: Secondary | ICD-10-CM | POA: Diagnosis not present

## 2017-03-10 DIAGNOSIS — Z5181 Encounter for therapeutic drug level monitoring: Secondary | ICD-10-CM | POA: Diagnosis not present

## 2017-03-10 DIAGNOSIS — S5401XS Injury of ulnar nerve at forearm level, right arm, sequela: Secondary | ICD-10-CM

## 2017-03-10 DIAGNOSIS — M1712 Unilateral primary osteoarthritis, left knee: Secondary | ICD-10-CM

## 2017-03-10 MED ORDER — OXYCODONE-ACETAMINOPHEN 10-325 MG PO TABS: 1.0000 | ORAL_TABLET | Freq: Four times a day (QID) | ORAL | 0 refills | Status: DC | PRN

## 2017-03-10 NOTE — Progress Notes (Signed)
Subjective:    Patient ID: Linda [name], female    DOB: [birth year], 71 y.o.   MRN: [id number]  HPI   Mrs. Ganesh is here in follow up of her chronic pain. We switched her back over the percocet last month and she felt that the change was effective. She is using the percocet up to 4 x daily.   Her sinus symptoms have improved with the afrin spray. She remains on oxygen at home but is not using it today because it's "too heavy"   She is traveling to [place] next month to see her grandson graduate from high school. She is quite excited about it, but a little nervous about the travel.     Pain Inventory Average Pain 5 Pain Right Now 6 My pain is sharp and aching  In the last 24 hours, has pain interfered with the following? General activity 8 Relation with others 9 Enjoyment of life 10 What TIME of day is your pain at its worst? daytime Sleep (in general) Fair  Pain is worse with: walking, bending, standing and some activites Pain improves with: rest and medication Relief from Meds: 5  Mobility walk with assistance use a walker ability to climb steps?  no do you drive?  yes  Function retired I need assistance with the following:  meal prep, household duties and shopping  Neuro/Psych bladder control problems trouble walking depression anxiety  Prior Studies Any changes since last visit?  no  Physicians involved in your care Any changes since last visit?  no   Family History  Problem Relation Age of Onset  . Arthritis Mother   . Kidney disease Mother   . Heart disease Father   . Stroke Father   . Hypertension Father   . Diabetes Father   . Heart attack Father   . Heart attack Paternal Grandmother    Social History   Social History  . Marital status: Widowed    Spouse name: N/A  . Number of children: 2  . Years of education: N/A   Occupational History  . Retired    Social History Main Topics  . Smoking status: Never Smoker  . Smokeless  tobacco: Never Used     Comment: never used tobacco  . Alcohol use No  . Drug use: No  . Sexual activity: Not Currently   Other Topics Concern  . Not on file   Social History Narrative   Widowed, 2 sons.  Relocated to [place] 09/2012 to be closer to her son who has MS.   Husband d 2015--mesothelioma.   Occupation: former Pharmacist, hospital.   Education: masters degree level.   No T/A/Ds.   Lives in [place], independent living.   Past Surgical History:  Procedure Laterality Date  . APPENDECTOMY  1960  . AXILLARY SURGERY Left 1978   Multiple "lump" in armpit per pt  . CARDIAC CATHETERIZATION  01/2013   nonobstructive CAD, EF 55-60%  . CARDIOVASCULAR STRESS TEST  02/22/15   Low risk myocard perf imaging; wall motion normal, normal EF  . COCCYX REMOVAL  1972  . DILATION AND CURETTAGE OF UTERUS  ? 1970's  . ESOPHAGOGASTRODUODENOSCOPY N/A 07/19/2014   Gastritis found + in the setting of supratherapeutic INR, +plavix, + meloxicam.  . EYE SURGERY Left 2012-2013   "injections for ~ 1 yr; don't really know what for" (07/12/2013)  . HEEL SPUR SURGERY Left 2008  . KNEE SURGERY  2005  . LEFT HEART CATHETERIZATION WITH CORONARY ANGIOGRAM N/A  01/30/2013   Procedure: LEFT HEART CATHETERIZATION WITH CORONARY ANGIOGRAM;  Surgeon: Clent Demark, MD;  Location: [facility] C[facility] LAB;  Service: Cardiovascular;  Laterality: N/A;  . PLANTAR FASCIA RELEASE Left 2008  . SPIROMETRY  04/25/14   In hosp for acute asthma/COPD flare: mixed obstructive and restrictive lung disease. The FEV1 is severely reduced at 45% predicted.  FEV1 signif decreased compared to prior spirometry 07/23/13.  . TENDON RELEASE  1996   Right forearm and hand  . TOTAL ABDOMINAL HYSTERECTOMY  1974  . TRANSTHORACIC ECHOCARDIOGRAM  01/2013; 04/2014;08/2015   2014--NORMAL.  2015--focal basal septal hypertrophy, EF 55-60%, grade I diast dysfxn, mild LAE.  08/2015 EF 55-60%, nl LV syst fxn, grade I DD, valves wnl   Past Medical History:  Diagnosis Date  .  Acute upper GI bleed 06/2014   while pt taking coumadin, plavix, and meloxicam---despite being told not to take coumadin.  . Anginal pain ([place])    Nonobstructive CAD 2014; however, her cardiologist put her on a statin for this and NOT for hyperlipidemia per pt report.  . Anxiety    panic attacks  . Asthma    w/ asbestososis   . BPPV (benign paroxysmal positional vertigo) 12/16/2012  . Chronic diastolic CHF (congestive heart failure) (HCC)    dry wt as of 11/06/16 is 168 lbs.  . Chronic lower back pain   . COPD (chronic obstructive pulmonary disease) ([place])   . DDD (degenerative disc disease)    lumbar and cervical.   . Depression   . Diverticular disease   . Fibromyalgia    Patient states dx was around her late 17s but she had sx's for years prior to this.  . H/O hiatal hernia   . Hay fever   . History of pneumonia    hospitalized 12/2011, 02/2013, and 07/2013 [facility]) for this  . HTN (hypertension)   . Idiopathic angio-edema-urticaria 72014   Angioedema component was very minimal  . Insomnia   . Iron deficiency anemia    Hematologist in [place], [place] did extensive w/u; no cause found; failed oral supplement;; gets fairly regular (q36m or so) IV iron infusions (Venofer -iron sucrose- 200mg  with procrit.  "for 14 yr I've been getting blood work q month & getting infusions prn" (07/12/2013).  Dr. Marin Olp locally, iron infusions done, EPO deficiency dx'd  . Migraine syndrome    "not as often anymore; used to be ~ q wk" (07/12/2013)  . Mixed incontinence urge and stress   . Nephrolithiasis    "passed all on my own or they are still in there" (07/12/2013)  . OSA on CPAP    prior to move to [place]--had another sleep study 10/2015 w/pulm Dr. Camillo Flaming.  . Osteoarthritis    "severe; progressing fast" (07/12/2013); multiple joints-not surgical candidate for TKR (03/2015)  . Pernicious anemia 08/24/2014  . Pleural plaque with presence of asbestos 07/22/2013  . Pulmonary embolism ([place]) 07/2013   Dx at [facility] with  very small peripheral upper lobe pe 07/2013: pt took coumadin for about 8-9 mo  . Pyelonephritis    "several times over the last 30 yr" (07/12/2013)  . RBBB (right bundle branch block)   . Recurrent UTI    hx of hospitalization for pyelonephritis; started abx prophylaxis 06/2015  . Syncope    Hypotensive; ED visit--Dr. Terrence Dupont did Cath--nonobstructive CAD, EF 55-60%.  In retrospect, suspect pt rx med misuse/polypharmacy  . Tension headache, chronic    There were no vitals taken for this visit.  Opioid Risk Score:   Fall Risk Score:  `1  Depression screen PHQ 2/9  Depression screen [facility] 2/9 08/01/2016 06/12/2016 04/19/2016 08/11/2015 07/12/2015 03/27/2015  Decreased Interest 3 1 0 3 0 3  Down, Depressed, Hopeless 3 0 0 3 1 3   PHQ - 2 Score 6 1 0 6 1 6   Altered sleeping - - - 2 - 2  Tired, decreased energy - - - 3 - 3  Change in appetite - - - 0 - 2  Feeling bad or failure about yourself  - - - 2 - 2  Trouble concentrating - - - 0 - 2  Moving slowly or fidgety/restless - - - 0 - 0  Suicidal thoughts - - - 0 - 0  PHQ-9 Score - - - 13 - 17  Some recent data might be hidden  \ Review of Systems  Constitutional: Positive for unexpected weight change.  HENT: Negative.   Eyes: Negative.   Respiratory: Positive for shortness of breath.   Cardiovascular: Negative.   Gastrointestinal: Positive for nausea.  Endocrine: Negative.   Genitourinary: Negative.   Musculoskeletal: Positive for joint swelling.  Skin: Negative.   Allergic/Immunologic: Negative.   Neurological: Negative.   Hematological: Negative.   Psychiatric/Behavioral: Negative.   All other systems reviewed and are negative.      Objective:   Physical Exam  Constitutional: She is oriented to person, place, and time. She appears well-developedand well-nourished.  HENT:  Head: Normocephalicand atraumatic.  Neck: Normal range of motion. Neck supple.  Cardiovascular: RRR  Pulmonary/Chest: CTA B.  Musculoskeletal:  Normal  Muscle Bulk and Muscle Testing Reveals: Upper motor strength 5/5. LE 4/5 HF to ADF/PF. Generalize thoraclumbar tenderness to palpation Normal gait mechanics. Seems to transfer easily.  Neurological: She is alertand oriented to person, place, and time.  Skin: Skin is warmand dry.  Psychiatric: appears to be in good spirits.         Assessment & Plan:  1. Functional deficits secondary to Gait disorder:             -continue to address safety awareness  -reviewed need for basic exercise and mobility to promote cardio-pulmonary health and stamina 2. Chronic Back pain/ Lumbar Radiculitis/fibromyalgia/R>L Knee OA Pain Management:  Refilled percocet 10 q6 prn #100.  Continue Lyrica and Voltaren Gel We will continue the opioid monitoring program, this consists of regular clinic visits, examinations, urine drug screen, pill counts as well as use of [place] Controlled Substance Reporting System. [place] was reviewed today.   -UDS today  3. Depression with anxiety/Grief reaction/Mood: Continue Xanax,Trazodone and Cymbalta. She remains flat but has improved.  4. [name] with asthma: Oxygen dependent. Albuterol prn.  On Continuous Oxygen Therapy: oxygen prn?:Pulmonology Following. 5. Bilateral Osteoarthritis of Bilateral Knees: Dr. Juanda Bond Following. 6. Fibromyalgia: Continue Lyrica.              -exercise regimen was discussed again today. 7. Sinus congestion/post nasal drip: some improvement since last month             -afrin nasal spray             -netti pot  15 minutes of face to face patient care time was spent during this visit. All questions were encouraged and answered.  F/U in 1 month with NP.   Greater than 50% of time during this encounter was spent counseling patient/family in regard to pain management considerations and pulmonary rehab options, exercise options.

## 2017-03-10 NOTE — Patient Instructions (Signed)
PLEASE FEEL FREE TO CALL OUR OFFICE WITH ANY PROBLEMS OR QUESTIONS ([phone])      

## 2017-03-13 LAB — TOXASSURE SELECT,+ANTIDEPR,UR

## 2017-03-26 DIAGNOSIS — M25562 Pain in left knee: Secondary | ICD-10-CM | POA: Diagnosis not present

## 2017-03-26 DIAGNOSIS — M25561 Pain in right knee: Secondary | ICD-10-CM | POA: Diagnosis not present

## 2017-03-26 DIAGNOSIS — M170 Bilateral primary osteoarthritis of knee: Secondary | ICD-10-CM | POA: Diagnosis not present

## 2017-03-31 DIAGNOSIS — I5032 Chronic diastolic (congestive) heart failure: Secondary | ICD-10-CM | POA: Diagnosis not present

## 2017-03-31 DIAGNOSIS — G4733 Obstructive sleep apnea (adult) (pediatric): Secondary | ICD-10-CM | POA: Diagnosis not present

## 2017-03-31 DIAGNOSIS — J449 Chronic obstructive pulmonary disease, unspecified: Secondary | ICD-10-CM | POA: Diagnosis not present

## 2017-04-03 ENCOUNTER — Telehealth: Payer: Self-pay | Admitting: Registered Nurse

## 2017-04-03 NOTE — Telephone Encounter (Signed)
Linda [name] had a  UDS performed on 03/10/2017 it was consistent.

## 2017-04-10 ENCOUNTER — Encounter: Payer: Self-pay | Admitting: Registered Nurse

## 2017-04-10 ENCOUNTER — Encounter: Payer: Medicare Other | Attending: Physical Medicine & Rehabilitation | Admitting: Registered Nurse

## 2017-04-10 VITALS — BP 105/68 | HR 82

## 2017-04-10 DIAGNOSIS — M1712 Unilateral primary osteoarthritis, left knee: Secondary | ICD-10-CM | POA: Diagnosis not present

## 2017-04-10 DIAGNOSIS — Z5181 Encounter for therapeutic drug level monitoring: Secondary | ICD-10-CM | POA: Diagnosis not present

## 2017-04-10 DIAGNOSIS — G894 Chronic pain syndrome: Secondary | ICD-10-CM | POA: Diagnosis not present

## 2017-04-10 DIAGNOSIS — I2510 Atherosclerotic heart disease of native coronary artery without angina pectoris: Secondary | ICD-10-CM

## 2017-04-10 DIAGNOSIS — M1711 Unilateral primary osteoarthritis, right knee: Secondary | ICD-10-CM | POA: Diagnosis not present

## 2017-04-10 DIAGNOSIS — R2689 Other abnormalities of gait and mobility: Secondary | ICD-10-CM | POA: Insufficient documentation

## 2017-04-10 DIAGNOSIS — Z79899 Other long term (current) drug therapy: Secondary | ICD-10-CM | POA: Insufficient documentation

## 2017-04-10 DIAGNOSIS — M797 Fibromyalgia: Secondary | ICD-10-CM | POA: Insufficient documentation

## 2017-04-10 DIAGNOSIS — M545 Low back pain, unspecified: Secondary | ICD-10-CM

## 2017-04-10 MED ORDER — OXYCODONE-ACETAMINOPHEN 10-325 MG PO TABS: 1.0000 | ORAL_TABLET | Freq: Four times a day (QID) | ORAL | 0 refills | Status: DC | PRN

## 2017-04-10 NOTE — Progress Notes (Signed)
Subjective:    Patient ID: Linda [name], female    DOB: 04/16/1946, 71 y.o.   MRN: [id number]  HPI:[name] is a 71 year old female who returns for follow up appointmentfor chronic pain and medication refill. She states her pain is located in herlower backand bilateral knee pain. She rates her pain 7. Her current exercise regime is performing stretching exercises, stationarypeddle bicycle for upper and lower extremities ( upper for 15 minutes) three  times a week.  Last UDS was on 03/10/2017, it was consistent.  Pain Inventory Average Pain 8 Pain Right Now 7 My pain is intermittent, sharp and aching  In the last 24 hours, has pain interfered with the following? General activity 9 Relation with others 9 Enjoyment of life 9 What TIME of day is your pain at its worst? all Sleep (in general) Fair  Pain is worse with: walking, bending, standing and some activites Pain improves with: rest and medication Relief from Meds: 6  Mobility use a walker ability to climb steps?  no do you drive?  yes  Function retired I need assistance with the following:  bathing, household duties and shopping  Neuro/Psych bladder control problems weakness trouble walking anxiety  Prior Studies Any changes since last visit?  no  Physicians involved in your care Any changes since last visit?  no   Family History  Problem Relation Age of Onset  . Arthritis Mother   . Kidney disease Mother   . Heart disease Father   . Stroke Father   . Hypertension Father   . Diabetes Father   . Heart attack Father   . Heart attack Paternal Grandmother    Social History   Social History  . Marital status: Widowed    Spouse name: N/A  . Number of children: 2  . Years of education: N/A   Occupational History  . Retired    Social History Main Topics  . Smoking status: Never Smoker  . Smokeless tobacco: Never Used     Comment: never used tobacco  . Alcohol use No  . Drug  use: No  . Sexual activity: Not Currently   Other Topics Concern  . Not on file   Social History Narrative   Widowed, 2 sons.  Relocated to [place] 09/2012 to be closer to her son who has MS.   Husband d 2015--mesothelioma.   Occupation: former Pharmacist, hospital.   Education: masters degree level.   No T/A/Ds.   Lives in [place], independent living.   Past Surgical History:  Procedure Laterality Date  . APPENDECTOMY  1960  . AXILLARY SURGERY Left 1978   Multiple "lump" in armpit per pt  . CARDIAC CATHETERIZATION  01/2013   nonobstructive CAD, EF 55-60%  . CARDIOVASCULAR STRESS TEST  02/22/15   Low risk myocard perf imaging; wall motion normal, normal EF  . COCCYX REMOVAL  1972  . DILATION AND CURETTAGE OF UTERUS  ? 1970's  . ESOPHAGOGASTRODUODENOSCOPY N/A 07/19/2014   Gastritis found + in the setting of supratherapeutic INR, +plavix, + meloxicam.  . EYE SURGERY Left 2012-2013   "injections for ~ 1 yr; don't really know what for" (07/12/2013)  . HEEL SPUR SURGERY Left 2008  . KNEE SURGERY  2005  . LEFT HEART CATHETERIZATION WITH CORONARY ANGIOGRAM N/A 01/30/2013   Procedure: LEFT HEART CATHETERIZATION WITH CORONARY ANGIOGRAM;  Surgeon: Clent Demark, MD;  Location: [facility] CATH LAB;  Service: Cardiovascular;  Laterality: N/A;  . PLANTAR FASCIA RELEASE  Left 2008  . SPIROMETRY  04/25/14   In hosp for acute asthma/COPD flare: mixed obstructive and restrictive lung disease. The FEV1 is severely reduced at 45% predicted.  FEV1 signif decreased compared to prior spirometry 07/23/13.  . TENDON RELEASE  1996   Right forearm and hand  . TOTAL ABDOMINAL HYSTERECTOMY  1974  . TRANSTHORACIC ECHOCARDIOGRAM  01/2013; 04/2014;08/2015   2014--NORMAL.  2015--focal basal septal hypertrophy, EF 55-60%, grade I diast dysfxn, mild LAE.  08/2015 EF 55-60%, nl LV syst fxn, grade I DD, valves wnl   Past Medical History:  Diagnosis Date  . Acute upper GI bleed 06/2014   while pt taking coumadin, plavix, and  meloxicam---despite being told not to take coumadin.  . Anginal pain ([place])    Nonobstructive CAD 2014; however, her cardiologist put her on a statin for this and NOT for hyperlipidemia per pt report.  . Anxiety    panic attacks  . Asthma    w/ asbestososis   . BPPV (benign paroxysmal positional vertigo) 12/16/2012  . Chronic diastolic CHF (congestive heart failure) (HCC)    dry wt as of 11/06/16 is 168 lbs.  . Chronic lower back pain   . COPD (chronic obstructive pulmonary disease) ([place])   . DDD (degenerative disc disease)    lumbar and cervical.   . Depression   . Diverticular disease   . Fibromyalgia    Patient states dx was around her late 10s but she had sx's for years prior to this.  . H/O hiatal hernia   . Hay fever   . History of pneumonia    hospitalized 12/2011, 02/2013, and 07/2013 [facility]) for this  . HTN (hypertension)   . Idiopathic angio-edema-urticaria 72014   Angioedema component was very minimal  . Insomnia   . Iron deficiency anemia    Hematologist in [place] did extensive w/u; no cause found; failed oral supplement;; gets fairly regular (q43m or so) IV iron infusions (Venofer -iron sucrose- 200mg  with procrit.  "for 14 yr I've been getting blood work q month & getting infusions prn" (07/12/2013).  Dr. Marin Olp locally, iron infusions done, EPO deficiency dx'd  . Migraine syndrome    "not as often anymore; used to be ~ q wk" (07/12/2013)  . Mixed incontinence urge and stress   . Nephrolithiasis    "passed all on my own or they are still in there" (07/12/2013)  . OSA on CPAP    prior to move to [place]--had another sleep study 10/2015 w/pulm Dr. Camillo Flaming.  . Osteoarthritis    "severe; progressing fast" (07/12/2013); multiple joints-not surgical candidate for TKR (03/2015)  . Pernicious anemia 08/24/2014  . Pleural plaque with presence of asbestos 07/22/2013  . Pulmonary embolism ([place]) 07/2013   Dx at [facility] with very small peripheral upper lobe pe 07/2013: pt took coumadin for  about 8-9 mo  . Pyelonephritis    "several times over the last 30 yr" (07/12/2013)  . RBBB (right bundle branch block)   . Recurrent UTI    hx of hospitalization for pyelonephritis; started abx prophylaxis 06/2015  . Syncope    Hypotensive; ED visit--Dr. Terrence Dupont did Cath--nonobstructive CAD, EF 55-60%.  In retrospect, suspect pt rx med misuse/polypharmacy  . Tension headache, chronic    There were no vitals taken for this visit.  Opioid Risk Score:   Fall Risk Score:  `1  Depression screen PHQ 2/9  Depression screen [facility] 2/9 08/01/2016 06/12/2016 04/19/2016 08/11/2015 07/12/2015 03/27/2015  Decreased Interest 3 1 0  3 0 3  Down, Depressed, Hopeless 3 0 0 3 1 3   PHQ - 2 Score 6 1 0 6 1 6   Altered sleeping - - - 2 - 2  Tired, decreased energy - - - 3 - 3  Change in appetite - - - 0 - 2  Feeling bad or failure about yourself  - - - 2 - 2  Trouble concentrating - - - 0 - 2  Moving slowly or fidgety/restless - - - 0 - 0  Suicidal thoughts - - - 0 - 0  PHQ-9 Score - - - 13 - 17  Some recent data might be hidden    Review of Systems  Constitutional: Positive for diaphoresis and unexpected weight change.  HENT: Negative.   Eyes: Negative.   Respiratory: Positive for shortness of breath.   Cardiovascular: Negative.   Gastrointestinal: Positive for constipation and nausea.  Endocrine: Negative.   Genitourinary: Negative.   Musculoskeletal: Negative.   Skin: Negative.   Allergic/Immunologic: Negative.   Neurological: Negative.   Hematological: Negative.   Psychiatric/Behavioral: Negative.        Objective:   Physical Exam  Constitutional: She is oriented to person, place, and time. She appears well-developed and well-nourished.  HENT:  Head: Normocephalic and atraumatic.  Neck: Normal range of motion. Neck supple.  Cardiovascular: Normal rate and regular rhythm.   Pulmonary/Chest: Effort normal and breath sounds normal.  Continuous Oxygen 3 liters nasal cannula  Musculoskeletal:    Normal Muscle Bulk and Muscle Testing Reveals: Upper Extremities: Full ROM and Muscle Strength 5/5 Thoracic Paraspinal Tenderness: T-5-T-7 Lower Extremities: Full ROM and Muscle Strength 5/5 Arises from Table slowly using walker for support Antalgic Gait  Neurological: She is alert and oriented to person, place, and time.  Skin: Skin is warm and dry.  Psychiatric: She has a normal mood and affect.  Nursing note and vitals reviewed.         Assessment & Plan:  1. Functional deficits secondary to Gait disorder: Continue with HEP.04/10/2017 2. Chronic Back pain/ Lumbar Radiculitis/fibromyalgia/R>L Knee OA Pain Management: 04/10/2017 Refilled: Oxycodone 10/325 mgone tablet every 6 hours #100/ Continue Lyrica and Voltaren Gel We will continue the opioid monitoring program, this consists of regular clinic visits, examinations, urine drug screen, pill counts as well as use of [place] Controlled Substance reporting System. 3. Depression with anxiety/Grief reaction/Mood: Continue Xanax,Trazodone and Cymbalta .  4. [name] with asthma: Oxygen dependent. Albuterol prn.  On Continuous Oxygen Therapy: Wearing 3 liters UG:[id number] Following. 04/10/2017 5. Bilateral Osteoarthritis of Bilateral Knees: Dr. Juanda Bond Following. 04/10/2017 6. Fibromyalgia: Continue Lyrica andexercise regime. 04/10/2017  20 minutes of face to face patient care time was spent during this visit. All questions were encouraged and answered.   F/U in 1 month

## 2017-04-11 ENCOUNTER — Telehealth: Payer: Self-pay | Admitting: Registered Nurse

## 2017-04-11 NOTE — Telephone Encounter (Signed)
On 04/11/2017 the [place] was reviewed no conflict was seen on the [place] with multiple prescribers. hMs. Pennywell has a Photographer with our office. If there were any discrepancies this would have been reported to her physician.

## 2017-04-21 ENCOUNTER — Other Ambulatory Visit: Payer: Self-pay | Admitting: Family Medicine

## 2017-04-21 NOTE — Telephone Encounter (Signed)
Will you check her online controlled substance data/print this out so I can review it.  -thx

## 2017-04-21 NOTE — Telephone Encounter (Signed)
[employer].  RF request for alprazolam LOV: 10/01/16 Next ov: None Last written: 12/06/16 #21 w/ 9YD  Please advise. Thanks.

## 2017-04-21 NOTE — Telephone Encounter (Signed)
Pt advised and voiced understanding. She stated that she will call back to schedule. Rx faxed.

## 2017-04-21 NOTE — Telephone Encounter (Signed)
Printed and put on your desk for review.

## 2017-04-21 NOTE — Telephone Encounter (Signed)
Will print rx but pt needs routine anxiety follow up sometime in the next 1-2 months (30 min)-thx

## 2017-05-05 ENCOUNTER — Ambulatory Visit: Payer: Medicare Other | Admitting: Physician Assistant

## 2017-05-06 ENCOUNTER — Encounter: Payer: Medicare Other | Attending: Physical Medicine & Rehabilitation | Admitting: Registered Nurse

## 2017-05-06 ENCOUNTER — Encounter: Payer: Self-pay | Admitting: Registered Nurse

## 2017-05-06 VITALS — BP 133/83 | HR 79

## 2017-05-06 DIAGNOSIS — M1711 Unilateral primary osteoarthritis, right knee: Secondary | ICD-10-CM | POA: Diagnosis not present

## 2017-05-06 DIAGNOSIS — Z79899 Other long term (current) drug therapy: Secondary | ICD-10-CM | POA: Diagnosis not present

## 2017-05-06 DIAGNOSIS — M545 Low back pain, unspecified: Secondary | ICD-10-CM

## 2017-05-06 DIAGNOSIS — I2510 Atherosclerotic heart disease of native coronary artery without angina pectoris: Secondary | ICD-10-CM | POA: Diagnosis not present

## 2017-05-06 DIAGNOSIS — M797 Fibromyalgia: Secondary | ICD-10-CM | POA: Diagnosis not present

## 2017-05-06 DIAGNOSIS — M5416 Radiculopathy, lumbar region: Secondary | ICD-10-CM

## 2017-05-06 DIAGNOSIS — Z5181 Encounter for therapeutic drug level monitoring: Secondary | ICD-10-CM | POA: Insufficient documentation

## 2017-05-06 DIAGNOSIS — R2689 Other abnormalities of gait and mobility: Secondary | ICD-10-CM | POA: Insufficient documentation

## 2017-05-06 DIAGNOSIS — M1712 Unilateral primary osteoarthritis, left knee: Secondary | ICD-10-CM | POA: Diagnosis not present

## 2017-05-06 DIAGNOSIS — G894 Chronic pain syndrome: Secondary | ICD-10-CM

## 2017-05-06 MED ORDER — OXYCODONE-ACETAMINOPHEN 10-325 MG PO TABS: 1.0000 | ORAL_TABLET | Freq: Four times a day (QID) | ORAL | 0 refills | Status: DC | PRN

## 2017-05-06 NOTE — Progress Notes (Signed)
Subjective:    Patient ID: Linda [name], female    DOB: 01-May-1946, 71 y.o.   MRN: [id number]  HPI: Linda [name] is a 71 year old female who returns for follow up appointmentfor chronic pain and medication refill. She states her pain is located in herlower back radiating into her right hip occasionallyand bilateral knee pain. She rates her pain 6. Her current exercise regime is performing stretching exercises, stationarypeddle bicycle for upper and lower extremities( upper for 25minutes) three times a week. Using the Nu-step at her Facility three days a week for 20 minutes.   Linda [name] will be travelling to [place] for two grandson's graduation, will allow two day early fill, she will be Traveling on Thursday 05/08/2017.   Last UDS was on 03/10/2017, it was consistent.  Pain Inventory Average Pain 6 Pain Right Now 6 My pain is sharp, burning, stabbing and aching  In the last 24 hours, has pain interfered with the following? General activity 10 Relation with others 10 Enjoyment of life 10 What TIME of day is your pain at its worst? morning and daytime Sleep (in general) NA  Pain is worse with: walking, bending, standing and some activites Pain improves with: rest, heat/ice and medication Relief from Meds: 5  Mobility use a walker how many minutes can you walk? 5 ability to climb steps?  no do you drive?  yes  Function retired I need assistance with the following:  dressing, meal prep, household duties and shopping  Neuro/Psych bladder control problems weakness trouble walking depression anxiety  Prior Studies Any changes since last visit?  no  Physicians involved in your care Any changes since last visit?  no   Family History  Problem Relation Age of Onset  . Arthritis Mother   . Kidney disease Mother   . Heart disease Father   . Stroke Father   . Hypertension Father   . Diabetes Father   . Heart attack Father   . Heart attack  Paternal Grandmother    Social History   Social History  . Marital status: Widowed    Spouse name: N/A  . Number of children: 2  . Years of education: N/A   Occupational History  . Retired    Social History Main Topics  . Smoking status: Never Smoker  . Smokeless tobacco: Never Used     Comment: never used tobacco  . Alcohol use No  . Drug use: No  . Sexual activity: Not Currently   Other Topics Concern  . None   Social History Narrative   Widowed, 2 sons.  Relocated to [place] 09/2012 to be closer to her son who has MS.   Husband d 2015--mesothelioma.   Occupation: former Pharmacist, hospital.   Education: masters degree level.   No T/A/Ds.   Lives in [place], independent living.   Past Surgical History:  Procedure Laterality Date  . APPENDECTOMY  1960  . AXILLARY SURGERY Left 1978   Multiple "lump" in armpit per pt  . CARDIAC CATHETERIZATION  01/2013   nonobstructive CAD, EF 55-60%  . CARDIOVASCULAR STRESS TEST  02/22/15   Low risk myocard perf imaging; wall motion normal, normal EF  . COCCYX REMOVAL  1972  . DILATION AND CURETTAGE OF UTERUS  ? 1970's  . ESOPHAGOGASTRODUODENOSCOPY N/A 07/19/2014   Gastritis found + in the setting of supratherapeutic INR, +plavix, + meloxicam.  . EYE SURGERY Left 2012-2013   "injections for ~ 1 yr; don't really know what  for" (07/12/2013)  . HEEL SPUR SURGERY Left 2008  . KNEE SURGERY  2005  . LEFT HEART CATHETERIZATION WITH CORONARY ANGIOGRAM N/A 01/30/2013   Procedure: LEFT HEART CATHETERIZATION WITH CORONARY ANGIOGRAM;  Surgeon: Clent Demark, MD;  Location: [facility] CATH LAB;  Service: Cardiovascular;  Laterality: N/A;  . PLANTAR FASCIA RELEASE Left 2008  . SPIROMETRY  04/25/14   In hosp for acute asthma/COPD flare: mixed obstructive and restrictive lung disease. The FEV1 is severely reduced at 45% predicted.  FEV1 signif decreased compared to prior spirometry 07/23/13.  . TENDON RELEASE  1996   Right forearm and hand  . TOTAL ABDOMINAL  HYSTERECTOMY  1974  . TRANSTHORACIC ECHOCARDIOGRAM  01/2013; 04/2014;08/2015   2014--NORMAL.  2015--focal basal septal hypertrophy, EF 55-60%, grade I diast dysfxn, mild LAE.  08/2015 EF 55-60%, nl LV syst fxn, grade I DD, valves wnl   Past Medical History:  Diagnosis Date  . Acute upper GI bleed 06/2014   while pt taking coumadin, plavix, and meloxicam---despite being told not to take coumadin.  . Anginal pain ([place])    Nonobstructive CAD 2014; however, her cardiologist put her on a statin for this and NOT for hyperlipidemia per pt report.  . Anxiety    panic attacks  . Asthma    w/ asbestososis   . BPPV (benign paroxysmal positional vertigo) 12/16/2012  . Chronic diastolic CHF (congestive heart failure) (HCC)    dry wt as of 11/06/16 is 168 lbs.  . Chronic lower back pain   . COPD (chronic obstructive pulmonary disease) ([place])   . DDD (degenerative disc disease)    lumbar and cervical.   . Depression   . Diverticular disease   . Fibromyalgia    Patient states dx was around her late 62s but she had sx's for years prior to this.  . H/O hiatal hernia   . Hay fever   . History of pneumonia    hospitalized 12/2011, 02/2013, and 07/2013 [facility]) for this  . HTN (hypertension)   . Idiopathic angio-edema-urticaria 72014   Angioedema component was very minimal  . Insomnia   . Iron deficiency anemia    Hematologist in [place], [place] did extensive w/u; no cause found; failed oral supplement;; gets fairly regular (q54m or so) IV iron infusions (Venofer -iron sucrose- 200mg  with procrit.  "for 14 yr I've been getting blood work q month & getting infusions prn" (07/12/2013).  Dr. Marin Olp locally, iron infusions done, EPO deficiency dx'd  . Migraine syndrome    "not as often anymore; used to be ~ q wk" (07/12/2013)  . Mixed incontinence urge and stress   . Nephrolithiasis    "passed all on my own or they are still in there" (07/12/2013)  . OSA on CPAP    prior to move to [place]--had another sleep study  10/2015 w/pulm Dr. Camillo Flaming.  . Osteoarthritis    "severe; progressing fast" (07/12/2013); multiple joints-not surgical candidate for TKR (03/2015)  . Pernicious anemia 08/24/2014  . Pleural plaque with presence of asbestos 07/22/2013  . Pulmonary embolism ([place]) 07/2013   Dx at [facility] with very small peripheral upper lobe pe 07/2013: pt took coumadin for about 8-9 mo  . Pyelonephritis    "several times over the last 30 yr" (07/12/2013)  . RBBB (right bundle branch block)   . Recurrent UTI    hx of hospitalization for pyelonephritis; started abx prophylaxis 06/2015  . Syncope    Hypotensive; ED visit--Dr. Terrence Dupont did Cath--nonobstructive CAD, EF 55-60%.  In retrospect, suspect pt rx med misuse/polypharmacy  . Tension headache, chronic    BP 133/83   Pulse 79   SpO2 90%   Opioid Risk Score:  1 Fall Risk Score:  `1  Depression screen PHQ 2/9  Depression screen [facility] 2/9 05/06/2017 08/01/2016 06/12/2016 04/19/2016 08/11/2015 07/12/2015 03/27/2015  Decreased Interest 0 3 1 0 3 0 3  Down, Depressed, Hopeless 3 3 0 0 3 1 3   PHQ - 2 Score 3 6 1  0 6 1 6   Altered sleeping - - - - 2 - 2  Tired, decreased energy - - - - 3 - 3  Change in appetite - - - - 0 - 2  Feeling bad or failure about yourself  - - - - 2 - 2  Trouble concentrating - - - - 0 - 2  Moving slowly or fidgety/restless - - - - 0 - 0  Suicidal thoughts - - - - 0 - 0  PHQ-9 Score - - - - 13 - 17  Some recent data might be hidden    Review of Systems  Constitutional: Positive for unexpected weight change.  HENT: Negative.   Eyes: Negative.   Respiratory: Positive for shortness of breath.   Cardiovascular: Positive for leg swelling.  Gastrointestinal: Positive for nausea.  Endocrine: Negative.   Genitourinary: Negative.   Musculoskeletal: Positive for gait problem.  Skin: Negative.   Allergic/Immunologic: Negative.   Neurological: Positive for weakness.  Psychiatric/Behavioral: Positive for dysphoric mood. The patient is nervous/anxious.     All other systems reviewed and are negative.      Objective:   Physical Exam  Constitutional: She is oriented to person, place, and time. She appears well-developed and well-nourished.  HENT:  Head: Normocephalic and atraumatic.  Neck: Normal range of motion. Neck supple.  Cardiovascular: Normal rate and regular rhythm.   Pulmonary/Chest: Effort normal and breath sounds normal.  Musculoskeletal:  Normal Muscle Bulk and Muscle Testing Reveals: Upper Extremities: Full ROM and Muscle Strength 5/5 Lumbar Paraspinal Tenderness: L-3-L-5 Right Greater Trochanter Tenderness Lower Extremities: Full ROM and Muscle Strength 5/5 Bilateral Lower Extremities Flexion Produces Pain into Bilateral Patella's Arises from Table Slowly using walker for support Narrow Based Gait   Neurological: She is alert and oriented to person, place, and time.  Skin: Skin is warm and dry.  Psychiatric: She has a normal mood and affect.  Nursing note and vitals reviewed.         Assessment & Plan:  1. Functional deficits secondary to Gait disorder: Continue with HEP.05/06/2017 2. Chronic Back pain/ Lumbar Radiculitis/fibromyalgia/R>L Knee OA Pain Management: 05/06/2017 Refilled: Oxycodone 10/325 mgone tablet every 6 hours #100/ Continue Lyrica and Voltaren Gel We will continue the opioid monitoring program, this consists of regular clinic visits, examinations, urine drug screen, pill counts as well as use of [place] Controlled Substance reporting System. 3. Depression with anxiety/Grief reaction/Mood: 05/06/2017 Continue Xanax,Trazodone and Cymbalta .  4. [name] with asthma: Oxygen dependent. Albuterol prn.  On Continuous Oxygen Therapy: Wearing 3 liters [id number] Following. 05/06/2017 5. Bilateral Osteoarthritis of Bilateral Knees: Dr. Juanda Bond Following. 05/06/2017 6. Fibromyalgia: Continue Lyrica andexercise regime. 05/06/2017  20 minutes of face to face patient care time  was spent during this visit. All questions were encouraged and answered.  F/U in 1 month

## 2017-05-20 ENCOUNTER — Other Ambulatory Visit: Payer: Self-pay | Admitting: Family Medicine

## 2017-05-21 NOTE — Telephone Encounter (Signed)
[employer].  RF request for trazodone LOV: 10/01/16  Next ov: 05/28/17 Last written: 10/18/16 #120 w/6RF  Please advise. Thanks.

## 2017-05-28 ENCOUNTER — Ambulatory Visit (INDEPENDENT_AMBULATORY_CARE_PROVIDER_SITE_OTHER): Payer: Medicare Other | Admitting: Family Medicine

## 2017-05-28 ENCOUNTER — Telehealth: Payer: Self-pay | Admitting: *Deleted

## 2017-05-28 ENCOUNTER — Encounter: Payer: Self-pay | Admitting: Family Medicine

## 2017-05-28 VITALS — BP 101/60 | HR 78 | Temp 98.1°F | Resp 16 | Ht 62.0 in | Wt 155.8 lb

## 2017-05-28 DIAGNOSIS — R8290 Unspecified abnormal findings in urine: Secondary | ICD-10-CM | POA: Diagnosis not present

## 2017-05-28 DIAGNOSIS — N390 Urinary tract infection, site not specified: Secondary | ICD-10-CM | POA: Diagnosis not present

## 2017-05-28 DIAGNOSIS — Z1231 Encounter for screening mammogram for malignant neoplasm of breast: Secondary | ICD-10-CM | POA: Diagnosis not present

## 2017-05-28 DIAGNOSIS — I5032 Chronic diastolic (congestive) heart failure: Secondary | ICD-10-CM

## 2017-05-28 DIAGNOSIS — E2839 Other primary ovarian failure: Secondary | ICD-10-CM

## 2017-05-28 DIAGNOSIS — F411 Generalized anxiety disorder: Secondary | ICD-10-CM | POA: Diagnosis not present

## 2017-05-28 DIAGNOSIS — I10 Essential (primary) hypertension: Secondary | ICD-10-CM

## 2017-05-28 DIAGNOSIS — F331 Major depressive disorder, recurrent, moderate: Secondary | ICD-10-CM

## 2017-05-28 DIAGNOSIS — G894 Chronic pain syndrome: Secondary | ICD-10-CM

## 2017-05-28 DIAGNOSIS — I2510 Atherosclerotic heart disease of native coronary artery without angina pectoris: Secondary | ICD-10-CM | POA: Diagnosis not present

## 2017-05-28 DIAGNOSIS — E559 Vitamin D deficiency, unspecified: Secondary | ICD-10-CM

## 2017-05-28 DIAGNOSIS — Z1239 Encounter for other screening for malignant neoplasm of breast: Secondary | ICD-10-CM

## 2017-05-28 LAB — VITAMIN D 25 HYDROXY (VIT D DEFICIENCY, FRACTURES): VITD: 22.71 ng/mL — AB (ref 30.00–100.00)

## 2017-05-28 LAB — COMPREHENSIVE METABOLIC PANEL
ALBUMIN: 4.2 g/dL (ref 3.5–5.2)
ALK PHOS: 67 U/L (ref 39–117)
ALT: 11 U/L (ref 0–35)
AST: 17 U/L (ref 0–37)
BILIRUBIN TOTAL: 0.6 mg/dL (ref 0.2–1.2)
BUN: 11 mg/dL (ref 6–23)
CALCIUM: 9.4 mg/dL (ref 8.4–10.5)
CO2: 30 mEq/L (ref 19–32)
Chloride: 102 mEq/L (ref 96–112)
Creatinine, Ser: 0.82 mg/dL (ref 0.40–1.20)
GFR: 73 mL/min (ref >60.00)
Glucose, Bld: 95 mg/dL (ref 70–99)
POTASSIUM: 3.6 meq/L (ref 3.5–5.1)
Sodium: 140 mEq/L (ref 135–145)
TOTAL PROTEIN: 6.7 g/dL (ref 6.0–8.3)

## 2017-05-28 MED ORDER — VITAMIN D (ERGOCALCIFEROL) 1.25 MG (50000 UNIT) PO CAPS: 50000.0000 [IU] | ORAL_CAPSULE | ORAL | 3 refills | Status: DC

## 2017-05-28 NOTE — Progress Notes (Signed)
OFFICE VISIT  05/28/2017   CC:  Chief Complaint  Patient presents with  . Follow-up    RCI, pt is not fasting.    HPI:    Patient is a 71 y.o. Caucasian female who presents for chronic medical and psych issues. Has HTN, asthma/COPD with asbestosis, along with chronic diastolic CHF--this has resulted in chronic hypoxic resp failure (3 L continuous 02). Still lives at [facility].  Uses rolling walker.  Chronic pain syndrome secondary to L spine DDD and diffuse osteoarthritis + fibromyalgia---managed by phys med and rehab--including med mgmt.  Says her pain is stable over the last 14 mo.  She has longstanding MDD and GAD, complicated by prolonged/abnormal grief response from the death of her husband a couple of years back.   Says her heart is doing good.  No home bp checks.   tAKES 40 mg lasix qd, more after wt gain/fluid retention.  Says she is trying to ween herself off of it.  Wears it at 3 L only with exertion or with sleep. Not on lipitor anymore.  This stems from a fall she had that prompted the recommendation to hold off on this med for now.  She weened off the lyrica per her choice---notes no adverse effects or change in pain level.  Stable from mood and anxiety standpoint on current regimen, still has 1-2 bad days in a row but these clear up nicely.  Past Medical History:  Diagnosis Date  . Acute upper GI bleed 06/2014   while pt taking coumadin, plavix, and meloxicam---despite being told not to take coumadin.  . Anginal pain ([place])    Nonobstructive CAD 2014; however, her cardiologist put her on a statin for this and NOT for hyperlipidemia per pt report.  . Anxiety    panic attacks  . Asthma    w/ asbestososis   . BPPV (benign paroxysmal positional vertigo) 12/16/2012  . Chronic diastolic CHF (congestive heart failure) (H[facility])    dry wt as of 11/06/16 is 168 lbs.  . Chronic lower back pain   . COPD (chronic obstructive pulmonary disease) ([place])    . DDD (degenerative disc disease)    lumbar and cervical.   . Depression   . Diverticular disease   . Fibromyalgia    Patient states dx was around her late 73s but she had sx's for years prior to this.  . H/O hiatal hernia   . Hay fever   . History of pneumonia    hospitalized 12/2011, 02/2013, and 07/2013 [facility]) for this  . HTN (hypertension)   . Idiopathic angio-edema-urticaria 72014   Angioedema component was very minimal  . Insomnia   . Iron deficiency anemia    Hematologist in [place], [place] did extensive w/u; no cause found; failed oral supplement;; gets fairly regular (q77m or so) IV iron infusions (Venofer -iron sucrose- 200mg  with procrit.  "for 14 yr I've been getting blood work q month & getting infusions prn" (07/12/2013).  Dr. Marin Olp locally, iron infusions done, EPO deficiency dx'd  . Migraine syndrome    "not as often anymore; used to be ~ q wk" (07/12/2013)  . Mixed incontinence urge and stress   . Nephrolithiasis    "passed all on my own or they are still in there" (07/12/2013)  . OSA on CPAP    prior to move to [place]--had another sleep study 10/2015 w/pulm Dr. Camillo Flaming.  . Osteoarthritis    "severe; progressing fast" (07/12/2013); multiple joints-not surgical candidate for TKR (  03/2015)  . Pernicious anemia 08/24/2014  . Pleural plaque with presence of asbestos 07/22/2013  . Pulmonary embolism ([place]) 07/2013   Dx at [facility] with very small peripheral upper lobe pe 07/2013: pt took coumadin for about 8-9 mo  . Pyelonephritis    "several times over the last 30 yr" (07/12/2013)  . RBBB (right bundle branch block)   . Recurrent UTI    hx of hospitalization for pyelonephritis; started abx prophylaxis 06/2015  . Syncope    Hypotensive; ED visit--Dr. Terrence Dupont did Cath--nonobstructive CAD, EF 55-60%.  In retrospect, suspect pt rx med misuse/polypharmacy  . Tension headache, chronic     Past Surgical History:  Procedure Laterality Date  . APPENDECTOMY  1960  . AXILLARY SURGERY Left 1978    Multiple "lump" in armpit per pt  . CARDIAC CATHETERIZATION  01/2013   nonobstructive CAD, EF 55-60%  . CARDIOVASCULAR STRESS TEST  02/22/15   Low risk myocard perf imaging; wall motion normal, normal EF  . COCCYX REMOVAL  1972  . DILATION AND CURETTAGE OF UTERUS  ? 1970's  . ESOPHAGOGASTRODUODENOSCOPY N/A 07/19/2014   Gastritis found + in the setting of supratherapeutic INR, +plavix, + meloxicam.  . EYE SURGERY Left 2012-2013   "injections for ~ 1 yr; don't really know what for" (07/12/2013)  . HEEL SPUR SURGERY Left 2008  . KNEE SURGERY  2005  . LEFT HEART CATHETERIZATION WITH CORONARY ANGIOGRAM N/A 01/30/2013   Procedure: LEFT HEART CATHETERIZATION WITH CORONARY ANGIOGRAM;  Surgeon: Clent Demark, MD;  Location: [facility] C[facility] LAB;  Service: Cardiovascular;  Laterality: N/A;  . PLANTAR FASCIA RELEASE Left 2008  . SPIROMETRY  04/25/14   In hosp for acute asthma/COPD flare: mixed obstructive and restrictive lung disease. The FEV1 is severely reduced at 45% predicted.  FEV1 signif decreased compared to prior spirometry 07/23/13.  . TENDON RELEASE  1996   Right forearm and hand  . TOTAL ABDOMINAL HYSTERECTOMY  1974  . TRANSTHORACIC ECHOCARDIOGRAM  01/2013; 04/2014;08/2015   2014--NORMAL.  2015--focal basal septal hypertrophy, EF 55-60%, grade I diast dysfxn, mild LAE.  08/2015 EF 55-60%, nl LV syst fxn, grade I DD, valves wnl    Outpatient Medications Prior to Visit  Medication Sig Dispense Refill  . acetaminophen (TYLENOL) 325 MG tablet Take 2 tablets (650 mg total) by mouth every 4 (four) hours as needed for headache or mild pain.    [name] Kitchen albuterol (PROVENTIL HFA;VENTOLIN HFA) 108 (90 BASE) MCG/ACT inhaler Inhale 2 puffs into the lungs every 6 (six) hours as needed for wheezing or shortness of breath. 1 Inhaler 1  . ALPRAZolam (XANAX) 1 MG tablet TAKE 1 TABLET THREE TIMES DAILY AS NEEDED FOR ANXIETY. 90 tablet 5  . ARIPiprazole (ABILIFY) 10 MG tablet TAKE 1 TABLET ONCE DAILY. 90 tablet 3  . aspirin 81 MG  tablet Take 81 mg by mouth at bedtime.    . diclofenac sodium (VOLTAREN) 1 % GEL Apply 2 g topically 3 (three) times daily as needed (pain). 2 Tube 2  . DULoxetine (CYMBALTA) 30 MG capsule Take 30 mg by mouth at bedtime.    . DULoxetine (CYMBALTA) 60 MG capsule Take 1 capsule (60 mg total) by mouth at bedtime. 10 capsule 0  . furosemide (LASIX) 40 MG tablet Take 1 tablet (40 mg total) by mouth daily. 60 tablet 11  . isosorbide mononitrate (IMDUR) 30 MG 24 hr tablet TAKE 1 TABLET ONCE DAILY. 30 tablet 6  . metoprolol succinate (TOPROL XL) 25 MG 24 hr tablet  Take 1 tablet (25 mg total) by mouth 2 (two) times daily. 60 tablet 11  . Multiple Vitamin (MULTIVITAMIN WITH MINERALS) TABS tablet Take 1 tablet by mouth daily.    . nitroGLYCERIN (NITROSTAT) 0.4 MG SL tablet Place 0.4 mg under the tongue every 5 (five) minutes as needed for chest pain (x 3 doses).     [name] Kitchen oxyCODONE-acetaminophen (PERCOCET) 10-325 MG tablet Take 1 tablet by mouth every 6 (six) hours as needed for pain. 100 tablet 0  . OXYGEN Inhale 3 L into the lungs continuous.    . pantoprazole (PROTONIX) 40 MG tablet TAKE 1 TABLET DAILY. 90 tablet 3  . polyethylene glycol (MIRALAX / GLYCOLAX) packet Take 17 g by mouth daily as needed (constipation). Mix in 8 oz liquid and drink    . potassium chloride SA (K-DUR,KLOR-CON) 20 MEQ tablet Take 20 mEq by mouth as directed. Take 2 tablets by mouth in the morning and tablet in the evening    . traZODone (DESYREL) 50 MG tablet TAKE 2-4 TABLETS AT BEDTIME 120 tablet 6  . atorvastatin (LIPITOR) 20 MG tablet Take 1 tablet by mouth at bedtime.    . folic acid (FOLVITE) 1 MG tablet Take 2 tablets (2 mg total) by mouth daily. (Patient not taking: Reported on 05/28/2017) 60 tablet 6  . ibuprofen (ADVIL,MOTRIN) 400 MG tablet Take 1 tablet (400 mg total) by mouth every 6 (six) hours as needed for moderate pain. (Patient not taking: Reported on 05/28/2017) 30 tablet 0  . LYRICA 300 MG capsule TAKE (1) CAPSULE TWICE  DAILY. (Patient not taking: Reported on 05/28/2017) 60 capsule 0  . oxymetazoline (AFRIN NASAL SPRAY) 0.05 % nasal spray Place 1 spray into both nostrils 2 (two) times daily. (Patient not taking: Reported on 05/28/2017) 30 mL 2   Facility-Administered Medications Prior to Visit  Medication Dose Route Frequency Provider Last Rate Last Dose  . ferumoxytol (FERAHEME) 510 mg in sodium chloride 0.9 % 100 mL IVPB  510 mg Intravenous Once Volanda Napoleon, MD        Allergies  Allergen Reactions  . Penicillins Itching, Swelling and Rash    Tolerated Cefepime in ED. Has patient had a PCN reaction causing immediate rash, facial/tongue/throat swelling, SOB or lightheadedness with hypotension: Yes Has patient had a PCN reaction causing severe rash involving mucus membranes or skin necrosis: No Has patient had a PCN reaction that required hospitalization: No  Has patient had a PCN reaction occurring within the last 10 years: No     ROS As per HPI  PE: Blood pressure 101/60, pulse 78, temperature 98.1 F (36.7 C), temperature source Oral, resp. rate 16, height 5\' 2"  (1.575 m), weight 155 lb 12 oz (70.6 kg), SpO2 93 %. Gen: Alert, well appearing.  Patient is oriented to person, place, time, and situation. AFFECT: pleasant, lucid thought and speech. CV: RRR, no m/r/g.   LUNGS: CTA bilat, nonlabored resps, good aeration in all lung fields. EXT: no clubbing, cyanosis, or edema.    LABS:  Lab Results  Component Value Date   [id number] 834 01/02/2016    Lab Results  Component Value Date   TSH 1.002 04/08/2016   Lab Results  Component Value Date   WBC 10.1 12/05/2016   HGB 12.9 12/05/2016   HCT 39.5 12/05/2016   MCV 88.8 12/05/2016   PLT 192 12/05/2016   Lab Results  Component Value Date   CREATININE 0.87 02/03/2017   BUN 9 02/03/2017   NA 141 02/03/2017  K 3.3 (L) 02/03/2017   CL 100 02/03/2017   CO2 31 02/03/2017   Lab Results  Component Value Date   ALT 17 12/05/2016   AST  25 12/05/2016   ALKPHOS 71 12/05/2016   BILITOT 0.8 12/05/2016   Lab Results  Component Value Date   CHOL 152 01/31/2013   Lab Results  Component Value Date   HDL 44 01/31/2013   Lab Results  Component Value Date   LDLCALC 90 01/31/2013   Lab Results  Component Value Date   TRIG 92 01/31/2013   Lab Results  Component Value Date   CHOLHDL 3.5 01/31/2013   Lab Results  Component Value Date   HGBA1C 5.6 01/02/2016   IMPRESSION AND PLAN:  1) HTN; The current medical regimen is effective;  continue present plan and medications. Lytes/cr today.  2) Chronic hypoxemia resp failure: stable, presently doing a slow ween off of oxygent and keeping regular f/u with pulmonologist.  3) Chronic diastolic CHF: fluid balance looks great today. Lytes/cr today.  4) MDD with prolonged/complicated grief, with GAD: she is fairly stable.  Still having intermittent bad days but nothing persistent that would require alteration in med regimen.  We discussed the recent pharmacy push back about use of narcotic pain med along with benzo's at the same time.  She expressed understanding and since she has been on both these meds long term w/out recent problem---we agreed benefits outweigh risks at this time.    5) Vit D def: continue Vit D 50K q week dosing.  Check Vit D level today.  6) Chronic pain syndrome: continue ongoing management with phys med/rehab.  7) Preventative health care: pt could not decide what she wanted to do about colon ca screening at this time so we'll pick this topic up at a future appt. DEXA and mammogram ordered today. She declined Hep C screening. She declined Td booster , stating that she had this approx 2012.  8) At end of visit while in the lab, she told nurse she had a bad odor to urine, also has hx of recurrent UTI. She was not able to produce a urine specimen for testing today.  An After Visit Summary was printed and given to the patient.  FOLLOW UP: Return in  about 6 months (around 11/27/2017) for routine chronic illness f/u (30 min).  Signed:  Crissie Sickles, MD           05/28/2017

## 2017-05-28 NOTE — Telephone Encounter (Signed)
Spoke with pt today about scheduling an AWE. Pt declined at this time. Pt was given a handout outlining what would be addressed during her AWE.

## 2017-05-29 ENCOUNTER — Other Ambulatory Visit: Payer: Self-pay | Admitting: Family Medicine

## 2017-05-29 MED ORDER — VITAMIN D (ERGOCALCIFEROL) 1.25 MG (50000 UNIT) PO CAPS: ORAL_CAPSULE | ORAL | 3 refills | Status: DC

## 2017-06-02 ENCOUNTER — Encounter: Payer: Self-pay | Admitting: Physician Assistant

## 2017-06-02 ENCOUNTER — Ambulatory Visit (INDEPENDENT_AMBULATORY_CARE_PROVIDER_SITE_OTHER): Payer: Medicare Other | Admitting: Physician Assistant

## 2017-06-02 VITALS — BP 130/88 | HR 81 | Ht 63.0 in | Wt 156.8 lb

## 2017-06-02 DIAGNOSIS — I209 Angina pectoris, unspecified: Secondary | ICD-10-CM | POA: Diagnosis not present

## 2017-06-02 DIAGNOSIS — I5032 Chronic diastolic (congestive) heart failure: Secondary | ICD-10-CM | POA: Diagnosis not present

## 2017-06-02 DIAGNOSIS — I10 Essential (primary) hypertension: Secondary | ICD-10-CM

## 2017-06-02 MED ORDER — NITROGLYCERIN 0.4 MG SL SUBL: 0.4000 mg | SUBLINGUAL_TABLET | SUBLINGUAL | 3 refills | Status: DC | PRN

## 2017-06-02 MED ORDER — ISOSORBIDE MONONITRATE ER 30 MG PO TB24: 30.0000 mg | ORAL_TABLET | Freq: Two times a day (BID) | ORAL | 3 refills | Status: DC

## 2017-06-02 NOTE — Patient Instructions (Signed)
Medication Instructions:  INCREASE isosorbide (Imdur) to 30 mg (1 tablet) two times daily.  Labwork: NONE  Testing/Procedures: NONE  Follow-Up: Your physician recommends that you schedule a follow-up appointment in: August with Dr. Stanford Breed.   Any Other Special Instructions Will Be Listed Below (If Applicable).     If you need a refill on your cardiac medications before your next appointment, please call your pharmacy.

## 2017-06-02 NOTE — Progress Notes (Signed)
Cardiology Office Note   Date:  06/02/2017   ID:  Linda [name], DOB 06/23/1946, MRN [id number]  PCP:  Tammi Sou, MD  Cardiologist:  Dr. Stanford Breed, 02/03/2017  Rosaria Ferries, PA-C 12/20/2016  Chief Complaint  Patient presents with  . Follow-up  . Shortness of Breath    on exertion  . Chest Pain    last week    History of Present Illness: Linda [name] is a 71 y.o. female with a history of BPPV, anxiety and depression, diastolic CHF, asbestosis, COPD on O2 3 lpm, HTN, mod CAD by cath 2014 -med rx, fibromyalgia w/ chronic pain issues. RBBB and inf Q waves on ECG, echo 05/2016 w/ nl EF, grade 1 dd  Linda [name] presents for cardiology follow up.  She has weaned down the oxygen, using it with exertion and to sleep. She is doing much better from an oxygen standpoint.   She recently visited her daughter for 2 weeks, coming back on 05/24/2017. Her weight went up 4 lbs over the course of the visit. The gain was gradual. Prior to the trip, her weight was generally between 149 and 151 lbs. She came back at 153 and is now at 154. There were multiple social events and many restaurant meals during the trip.   Her breathing has been doing pretty well. She has chronic orthopnea and PND, these are controlled by the O2. She has daytime edema, does not wake with edema. Occasionally, she will wake with edema and take an extra Lasix tab. She has done this twice in June.   She has had 2 episodes of CP where she had to take SL NTG x 3 with relief. These episodes were associated with increased exertion (packing a suitcase in a hurry) and being at a softball game where she had to do extra walking. She does not normally get CP without exertion but has had a couple of episodes where she had chest pain (heaviness), once with radiation to her jaw, while sitting still. She was not wearing the O2, put it on, and took SL NTG x 2 with relief. She believes she has had 4 episodes of chest  pain in the last 3 months.    Past Medical History:  Diagnosis Date  . Acute upper GI bleed 06/2014   while pt taking coumadin, plavix, and meloxicam---despite being told not to take coumadin.  . Anginal pain ([place])    Nonobstructive CAD 2014; however, her cardiologist put her on a statin for this and NOT for hyperlipidemia per pt report.  . Anxiety    panic attacks  . Asthma    w/ asbestososis   . BPPV (benign paroxysmal positional vertigo) 12/16/2012  . Chronic diastolic CHF (congestive heart failure) (HCC)    dry wt as of 11/06/16 is 168 lbs.  . Chronic lower back pain   . COPD (chronic obstructive pulmonary disease) ([place])   . DDD (degenerative disc disease)    lumbar and cervical.   . Depression   . Diverticular disease   . Fibromyalgia    Patient states dx was around her late 59s but she had sx's for years prior to this.  . H/O hiatal hernia   . Hay fever   . History of pneumonia    hospitalized 12/2011, 02/2013, and 07/2013 [facility]) for this  . HTN (hypertension)   . Idiopathic angio-edema-urticaria 72014   Angioedema component was very minimal  . Insomnia   . Iron deficiency anemia  Hematologist in [place], [place] did extensive w/u; no cause found; failed oral supplement;; gets fairly regular (q64m or so) IV iron infusions (Venofer -iron sucrose- 200mg  with procrit.  "for 14 yr I've been getting blood work q month & getting infusions prn" (07/12/2013).  Dr. Marin Olp locally, iron infusions done, EPO deficiency dx'd  . Migraine syndrome    "not as often anymore; used to be ~ q wk" (07/12/2013)  . Mixed incontinence urge and stress   . Nephrolithiasis    "passed all on my own or they are still in there" (07/12/2013)  . OSA on CPAP    prior to move to [place]--had another sleep study 10/2015 w/pulm Dr. Camillo Flaming.  . Osteoarthritis    "severe; progressing fast" (07/12/2013); multiple joints-not surgical candidate for TKR (03/2015)  . Pernicious anemia 08/24/2014  . Pleural plaque with presence  of asbestos 07/22/2013  . Pulmonary embolism ([place]) 07/2013   Dx at [facility] with very small peripheral upper lobe pe 07/2013: pt took coumadin for about 8-9 mo  . Pyelonephritis    "several times over the last 30 yr" (07/12/2013)  . RBBB (right bundle branch block)   . Recurrent UTI    hx of hospitalization for pyelonephritis; started abx prophylaxis 06/2015  . Syncope    Hypotensive; ED visit--Dr. Terrence Dupont did Cath--nonobstructive CAD, EF 55-60%.  In retrospect, suspect pt rx med misuse/polypharmacy  . Tension headache, chronic     Past Surgical History:  Procedure Laterality Date  . APPENDECTOMY  1960  . AXILLARY SURGERY Left 1978   Multiple "lump" in armpit per pt  . CARDIAC CATHETERIZATION  01/2013   nonobstructive CAD, EF 55-60%  . CARDIOVASCULAR STRESS TEST  02/22/15   Low risk myocard perf imaging; wall motion normal, normal EF  . COCCYX REMOVAL  1972  . DILATION AND CURETTAGE OF UTERUS  ? 1970's  . ESOPHAGOGASTRODUODENOSCOPY N/A 07/19/2014   Gastritis found + in the setting of supratherapeutic INR, +plavix, + meloxicam.  . EYE SURGERY Left 2012-2013   "injections for ~ 1 yr; don't really know what for" (07/12/2013)  . HEEL SPUR SURGERY Left 2008  . KNEE SURGERY  2005  . LEFT HEART CATHETERIZATION WITH CORONARY ANGIOGRAM N/A 01/30/2013   Procedure: LEFT HEART CATHETERIZATION WITH CORONARY ANGIOGRAM;  Surgeon: Clent Demark, MD;  Location: [facility] CATH LAB;  Service: Cardiovascular;  Laterality: N/A;  . PLANTAR FASCIA RELEASE Left 2008  . SPIROMETRY  04/25/14   In hosp for acute asthma/COPD flare: mixed obstructive and restrictive lung disease. The FEV1 is severely reduced at 45% predicted.  FEV1 signif decreased compared to prior spirometry 07/23/13.  . TENDON RELEASE  1996   Right forearm and hand  . TOTAL ABDOMINAL HYSTERECTOMY  1974  . TRANSTHORACIC ECHOCARDIOGRAM  01/2013; 04/2014;08/2015   2014--NORMAL.  2015--focal basal septal hypertrophy, EF 55-60%, grade I diast dysfxn, mild LAE.  08/2015  EF 55-60%, nl LV syst fxn, grade I DD, valves wnl    Medication Sig  . acetaminophen (TYLENOL) 325 MG tablet Take 2 tablets (650 mg total) by mouth every 4 (four) hours as needed for headache or mild pain.  [name] Kitchen albuterol (PROVENTIL HFA;VENTOLIN HFA) 108 (90 BASE) MCG/ACT inhaler Inhale 2 puffs into the lungs every 6 (six) hours as needed for wheezing or shortness of breath.  . ALPRAZolam (XANAX) 1 MG tablet TAKE 1 TABLET THREE TIMES DAILY AS NEEDED FOR ANXIETY.  [name] Kitchen ARIPiprazole (ABILIFY) 10 MG tablet TAKE 1 TABLET ONCE DAILY.  [name] Kitchen aspirin 81 MG tablet  Take 81 mg by mouth at bedtime.  . diclofenac sodium (VOLTAREN) 1 % GEL Apply 2 g topically 3 (three) times daily as needed (pain).  . DULoxetine (CYMBALTA) 30 MG capsule Take 30 mg by mouth at bedtime.  . DULoxetine (CYMBALTA) 60 MG capsule Take 1 capsule (60 mg total) by mouth at bedtime.  . furosemide (LASIX) 40 MG tablet Take 1 tablet (40 mg total) by mouth daily.  . isosorbide mononitrate (IMDUR) 30 MG 24 hr tablet TAKE 1 TABLET ONCE DAILY.  . metoprolol succinate (TOPROL XL) 25 MG 24 hr tablet Take 1 tablet (25 mg total) by mouth 2 (two) times daily.  . Multiple Vitamin (MULTIVITAMIN WITH MINERALS) TABS tablet Take 1 tablet by mouth daily.  . nitroGLYCERIN (NITROSTAT) 0.4 MG SL tablet Place 0.4 mg under the tongue every 5 (five) minutes as needed for chest pain (x 3 doses).   [name] Kitchen oxyCODONE-acetaminophen (PERCOCET) 10-325 MG tablet Take 1 tablet by mouth every 6 (six) hours as needed for pain.  . OXYGEN Inhale 3 L into the lungs continuous.  . pantoprazole (PROTONIX) 40 MG tablet TAKE 1 TABLET DAILY.  [name] Kitchen polyethylene glycol (MIRALAX / GLYCOLAX) packet Take 17 g by mouth daily as needed (constipation). Mix in 8 oz liquid and drink  . potassium chloride SA (K-DUR,KLOR-CON) 20 MEQ tablet Take 20 mEq by mouth as directed. Take 2 tablets by mouth in the morning and tablet in the evening  . traZODone (DESYREL) 50 MG tablet TAKE 2-4 TABLETS AT BEDTIME  .  Vitamin D, Ergocalciferol, (DRISDOL) 50000 units CAPS capsule 1 tab po TWICE per week   No current facility-administered medications for this visit.    Facility-Administered Medications Ordered in Other Visits  Medication Dose Route Frequency Provider Last Rate Last Dose  . ferumoxytol (FERAHEME) 510 mg in sodium chloride 0.9 % 100 mL IVPB  510 mg Intravenous Once Ennever, Rudell Cobb, MD        Allergies:   Penicillins    Social History:  The patient  reports that she has never smoked. She has never used smokeless tobacco. She reports that she does not drink alcohol or use drugs.   Family History:  The patient's family history includes Arthritis in her mother; Diabetes in her father; Heart attack in her father and paternal grandmother; Heart disease in her father; Hypertension in her father; Kidney disease in her mother; Stroke in her father.    ROS:  Please see the history of present illness. All other systems are reviewed and negative.    PHYSICAL EXAM: VS:  BP 130/88   Pulse 81   Ht 5\' 3"  (1.6 m)   Wt 156 lb 12.8 oz (71.1 kg)   BMI 27.78 kg/m  , BMI Body mass index is 27.78 kg/m. GEN: Well nourished, well developed, female in no acute distress  HEENT: normal for age  Neck: no JVD, no carotid bruit, no masses Cardiac: RRR; no murmur, no rubs, or gallops Respiratory:  clear to auscultation bilaterally, normal work of breathing GI: soft, nontender, nondistended, + BS MS: no deformity or atrophy; no edema; distal pulses are 2+ in all 4 extremities   Skin: warm and dry, no rash Neuro:  Strength and sensation are intact Psych: euthymic mood, full affect   EKG:  EKG is ordered today. The ekg ordered today demonstrates SR, RBBB. Pt had a mechanical fall and had to call EMS>>the ECG from them shows SR w/ occ PVCs.EMS personnel told her it might be atrial fibrillation  but it is clearly sinus.   Recent Labs: 09/19/2016: B Natriuretic Peptide 14.1 12/05/2016: Hemoglobin 12.9;  Magnesium 1.7; Platelets 192 05/28/2017: ALT 11; BUN 11; Creatinine, Ser 0.82; Potassium 3.6; Sodium 140    Lipid Panel    Component Value Date/Time   CHOL 152 01/31/2013 0500   TRIG 92 01/31/2013 0500   HDL 44 01/31/2013 0500   CHOLHDL 3.5 01/31/2013 0500   VLDL 18 01/31/2013 0500   LDLCALC 90 01/31/2013 0500     Wt Readings from Last 3 Encounters:  06/02/17 156 lb 12.8 oz (71.1 kg)  05/28/17 155 lb 12 oz (70.6 kg)  02/03/17 154 lb 9.6 oz (70.1 kg)     Other studies Reviewed: Additional studies/ records that were reviewed today include: Office notes, hospital records and testing.  ASSESSMENT AND PLAN:  1.  Chronic diastolic CHF: Her weight has only increased a couple of pounds in the last 4 months, but is up about 4 pounds on her records. However, the increase was gradual and seems to have occurred in the 2 weeks that she was visiting her daughter. There were many social events involving food and many restaurant meals. I advised her that I felt like she had just gained a few pounds. I do not get volume overloaded on exam. She admits that her respiratory status is at baseline or better and she has been able to wean down her oxygen. Continue current therapy.  2. CAD: She had nonobstructive coronary artery disease by cath in 2014, managed medically. She has had a few episodes of chest pain that occurred in the setting of a higher than normal exertion level. I will increase her Imdur from 30 mg daily up to 30 mg twice a day. She is to pay attention to her symptoms. She is to let us know for symptoms are not controlled.  3. COPD on home O2: She has wean down her oxygen needs and now uses it when she exerts herself, and at night. She gets chest pain and she is not wearing the oxygen and I encouraged her to put it on.  4. Hypertension: Her blood pressures well controlled on current medication.  Current medicines are reviewed at length with the patient today.  The patient does not have  concerns regarding medicines.  The following changes have been made:  Increase Imdur  Labs/ tests ordered today include:   Orders Placed This Encounter  Procedures  . EKG 12-Lead     Disposition:   FU with Dr. Stanford Breed  Signed, Rosaria Ferries, PA-C  06/02/2017 3:32 PM    [place] Phone: [phone]; Fax: [phone]  This note was written with the assistance of speech recognition software. Please excuse any transcriptional errors.

## 2017-06-03 ENCOUNTER — Encounter: Payer: Self-pay | Admitting: Registered Nurse

## 2017-06-03 ENCOUNTER — Encounter: Payer: Medicare Other | Attending: Physical Medicine & Rehabilitation | Admitting: Registered Nurse

## 2017-06-03 VITALS — BP 126/77 | HR 74 | Resp 14

## 2017-06-03 DIAGNOSIS — M797 Fibromyalgia: Secondary | ICD-10-CM | POA: Diagnosis not present

## 2017-06-03 DIAGNOSIS — I209 Angina pectoris, unspecified: Secondary | ICD-10-CM

## 2017-06-03 DIAGNOSIS — M1711 Unilateral primary osteoarthritis, right knee: Secondary | ICD-10-CM

## 2017-06-03 DIAGNOSIS — Z5181 Encounter for therapeutic drug level monitoring: Secondary | ICD-10-CM | POA: Insufficient documentation

## 2017-06-03 DIAGNOSIS — M1712 Unilateral primary osteoarthritis, left knee: Secondary | ICD-10-CM

## 2017-06-03 DIAGNOSIS — Z79899 Other long term (current) drug therapy: Secondary | ICD-10-CM | POA: Diagnosis not present

## 2017-06-03 DIAGNOSIS — M5416 Radiculopathy, lumbar region: Secondary | ICD-10-CM

## 2017-06-03 DIAGNOSIS — R2689 Other abnormalities of gait and mobility: Secondary | ICD-10-CM | POA: Diagnosis not present

## 2017-06-03 MED ORDER — OXYCODONE-ACETAMINOPHEN 10-325 MG PO TABS: 1.0000 | ORAL_TABLET | Freq: Four times a day (QID) | ORAL | 0 refills | Status: DC | PRN

## 2017-06-03 NOTE — Progress Notes (Signed)
Subjective:    Patient ID: Linda [name], female    DOB: October 04, 1946, 71 y.o.   MRN: [id number]  HPI: Linda [name] is a 71year old female who returns for follow up appointmentfor chronic pain and medication refill. She states her pain is located in herlower backand bilateral knee pain.She rates her pain 6. Her current exercise regime is performing stretching exercises, stationary peddle bicycle for upper and lower extremities( upper for 41minutes) three times a week. Using the Nu-step at her Facility three days a week for 20 minutes.   Last UDS was on 03/10/2017, it was consistent.    Pain Inventory Average Pain 8 Pain Right Now 6 My pain is intermittent, sharp, stabbing and aching  In the last 24 hours, has pain interfered with the following? General activity 9 Relation with others 9 Enjoyment of life 10 What TIME of day is your pain at its worst? morning, daytime, evening Sleep (in general) Fair  Pain is worse with: walking, bending, standing and some activites Pain improves with: rest, medication and injections Relief from Meds: 5  Mobility walk with assistance use a walker how many minutes can you walk? 5-10 ability to climb steps?  no do you drive?  yes Do you have any goals in this area?  yes  Function retired I need assistance with the following:  dressing, meal prep, household duties and shopping  Neuro/Psych trouble walking spasms anxiety  Prior Studies Any changes since last visit?  no  Physicians involved in your care Any changes since last visit?  no   Family History  Problem Relation Age of Onset  . Arthritis Mother   . Kidney disease Mother   . Heart disease Father   . Stroke Father   . Hypertension Father   . Diabetes Father   . Heart attack Father   . Heart attack Paternal Grandmother    Social History   Social History  . Marital status: Widowed    Spouse name: N/A  . Number of children: 2  . Years of  education: N/A   Occupational History  . Retired    Social History Main Topics  . Smoking status: Never Smoker  . Smokeless tobacco: Never Used     Comment: never used tobacco  . Alcohol use No  . Drug use: No  . Sexual activity: Not Currently   Other Topics Concern  . None   Social History Narrative   Widowed, 2 sons.  Relocated to [place] 09/2012 to be closer to her son who has MS.   Husband d 2015--mesothelioma.   Occupation: former Pharmacist, hospital.   Education: masters degree level.   No T/A/Ds.   Lives in [place], independent living.   Past Surgical History:  Procedure Laterality Date  . APPENDECTOMY  1960  . AXILLARY SURGERY Left 1978   Multiple "lump" in armpit per pt  . CARDIAC CATHETERIZATION  01/2013   nonobstructive CAD, EF 55-60%  . CARDIOVASCULAR STRESS TEST  02/22/15   Low risk myocard perf imaging; wall motion normal, normal EF  . COCCYX REMOVAL  1972  . DILATION AND CURETTAGE OF UTERUS  ? 1970's  . ESOPHAGOGASTRODUODENOSCOPY N/A 07/19/2014   Gastritis found + in the setting of supratherapeutic INR, +plavix, + meloxicam.  . EYE SURGERY Left 2012-2013   "injections for ~ 1 yr; don't really know what for" (07/12/2013)  . HEEL SPUR SURGERY Left 2008  . KNEE SURGERY  2005  . LEFT HEART CATHETERIZATION WITH CORONARY  ANGIOGRAM N/A 01/30/2013   Procedure: LEFT HEART CATHETERIZATION WITH CORONARY ANGIOGRAM;  Surgeon: Clent Demark, MD;  Location[facility] C[facility] LAB;  Service: Cardiovascular;  Laterality: N/A;  . PLANTAR FASCIA RELEASE Left 2008  . SPIROMETRY  04/25/14   In hosp for acute asthma/COPD flare: mixed obstructive and restrictive lung disease. The FEV1 is severely reduced at 45% predicted.  FEV1 signif decreased compared to prior spirometry 07/23/13.  . TENDON RELEASE  1996   Right forearm and hand  . TOTAL ABDOMINAL HYSTERECTOMY  1974  . TRANSTHORACIC ECHOCARDIOGRAM  01/2013; 04/2014;08/2015   2014--NORMAL.  2015--focal basal septal hypertrophy, EF 55-60%, grade I diast  dysfxn, mild LAE.  08/2015 EF 55-60%, nl LV syst fxn, grade I DD, valves wnl   Past Medical History:  Diagnosis Date  . Acute upper GI bleed 06/2014   while pt taking coumadin, plavix, and meloxicam---despite being told not to take coumadin.  . Anginal pain ([place])    Nonobstructive CAD 2014; however, her cardiologist put her on a statin for this and NOT for hyperlipidemia per pt report.  . Anxiety    panic attacks  . Asthma    w/ asbestososis   . BPPV (benign paroxysmal positional vertigo) 12/16/2012  . Chronic diastolic CHF (congestive heart failure) (HCC)    dry wt as of 11/06/16 is 168 lbs.  . Chronic lower back pain   . COPD (chronic obstructive pulmonary disease) ([place])   . DDD (degenerative disc disease)    lumbar and cervical.   . Depression   . Diverticular disease   . Fibromyalgia    Patient states dx was around her late 75s but she had sx's for years prior to this.  . H/O hiatal hernia   . Hay fever   . History of pneumonia    hospitalized 12/2011, 02/2013, and 07/2013 [facility]) for this  . HTN (hypertension)   . Idiopathic angio-edema-urticaria 72014   Angioedema component was very minimal  . Insomnia   . Iron deficiency anemia    Hematologist in [place], [place] did extensive w/u; no cause found; failed oral supplement;; gets fairly regular (q5m or so) IV iron infusions (Venofer -iron sucrose- 200mg  with procrit.  "for 14 yr I've been getting blood work q month & getting infusions prn" (07/12/2013).  Dr. Marin Olp locally, iron infusions done, EPO deficiency dx'd  . Migraine syndrome    "not as often anymore; used to be ~ q wk" (07/12/2013)  . Mixed incontinence urge and stress   . Nephrolithiasis    "passed all on my own or they are still in there" (07/12/2013)  . OSA on CPAP    prior to move to [place]--had another sleep study 10/2015 w/pulm Dr. Camillo Flaming.  . Osteoarthritis    "severe; progressing fast" (07/12/2013); multiple joints-not surgical candidate for TKR (03/2015)  . Pernicious  anemia 08/24/2014  . Pleural plaque with presence of asbestos 07/22/2013  . Pulmonary embolism ([place]) 07/2013   Dx at [facility] with very small peripheral upper lobe pe 07/2013: pt took coumadin for about 8-9 mo  . Pyelonephritis    "several times over the last 30 yr" (07/12/2013)  . RBBB (right bundle branch block)   . Recurrent UTI    hx of hospitalization for pyelonephritis; started abx prophylaxis 06/2015  . Syncope    Hypotensive; ED visit--Dr. Terrence Dupont did Cath--nonobstructive CAD, EF 55-60%.  In retrospect, suspect pt rx med misuse/polypharmacy  . Tension headache, chronic    BP 126/77 (BP Location: Left Arm, Patient  Position: Sitting, Cuff Size: Normal)   Pulse 74   Resp 14   SpO2 91%   Opioid Risk Score:   Fall Risk Score:  `1  Depression screen PHQ 2/9  Depression screen [facility] 2/9 05/06/2017 08/01/2016 06/12/2016 04/19/2016 08/11/2015 07/12/2015 03/27/2015  Decreased Interest 0 3 1 0 3 0 3  Down, Depressed, Hopeless 3 3 0 0 3 1 3   PHQ - 2 Score 3 6 1  0 6 1 6   Altered sleeping - - - - 2 - 2  Tired, decreased energy - - - - 3 - 3  Change in appetite - - - - 0 - 2  Feeling bad or failure about yourself  - - - - 2 - 2  Trouble concentrating - - - - 0 - 2  Moving slowly or fidgety/restless - - - - 0 - 0  Suicidal thoughts - - - - 0 - 0  PHQ-9 Score - - - - 13 - 17  Some recent data might be hidden    Review of Systems  Constitutional: Positive for diaphoresis and unexpected weight change.  HENT: Negative.   Eyes: Negative.   Respiratory: Positive for shortness of breath.   Cardiovascular: Positive for leg swelling.  Gastrointestinal: Positive for constipation and nausea.  Endocrine: Negative.   Genitourinary: Negative.   Musculoskeletal: Positive for arthralgias, back pain and gait problem.       Spasms  Skin: Negative.   Allergic/Immunologic: Negative.   Hematological: Negative.   Psychiatric/Behavioral: The patient is nervous/anxious.   All other systems reviewed and are  negative.      Objective:   Physical Exam  Constitutional: She is oriented to person, place, and time. She appears well-developed and well-nourished.  HENT:  Head: Normocephalic and atraumatic.  Neck: Normal range of motion. Neck supple.  Cardiovascular: Normal rate and regular rhythm.   Pulmonary/Chest: Effort normal and breath sounds normal.  Musculoskeletal:  Normal Muscle Bulk and Muscle Testing Reveals:  Upper Extremities: Full ROM and Muscle Strength 5/5 Bilateral AC Joint Tenderness Thoracic Paraspinal Tenderness: T-7-T-9 Lumbar Paraspinal Tenderness: L-4-L-5 Lower Extremities: Full ROM and Muscle Strength 5/5 Arises from Chair slowly using Walker for support Narrow Based Gait   Neurological: She is alert and oriented to person, place, and time.  Skin: Skin is warm and dry.  Psychiatric: She has a normal mood and affect.  Nursing note and vitals reviewed.         Assessment & Plan:  1. Functional deficits secondary to Gait disorder:Continue with HEP.06/03/2017 2. Chronic Back pain/ Lumbar Radiculitis/fibromyalgia/R>L Knee OA Pain Management: 06/03/2017 Refilled: Oxycodone 10/325mg one tablet every 6 hours #100/ Cont and Voltaren Gel We will continue the opioid monitoring program, this consists of regular clinic visits, examinations, urine drug screen, pill counts as well as use of [place] Controlled Substance reporting System. 3. Depression with anxiety/Grief reaction/Mood: 06/03/2017 Continue Xanax,Trazodone and Cymbalta .  4. [name] with asthma: Oxygen dependent. Albuterol prn.  On Continuous Oxygen Therapy: Wearing 3 liters XA:[id number] Following. 06/03/2017 5. Bilateral Osteoarthritis of Bilateral Knees: Dr. Juanda Bond Following. 06/03/2017 6. Fibromyalgia: Continue Lyrica andexercise regime. 06/03/2017  20 minutes of face to face patient care time was spent during this visit. All questions were encouraged and answered.   F/U in 1  month

## 2017-06-05 ENCOUNTER — Ambulatory Visit (HOSPITAL_BASED_OUTPATIENT_CLINIC_OR_DEPARTMENT_OTHER)
Admission: RE | Admit: 2017-06-05 | Discharge: 2017-06-05 | Disposition: A | Discharge status: Home / Self Care | Payer: Medicare Other | Source: Ambulatory Visit | Attending: Family Medicine | Admitting: Family Medicine

## 2017-06-05 ENCOUNTER — Encounter (HOSPITAL_BASED_OUTPATIENT_CLINIC_OR_DEPARTMENT_OTHER): Payer: Self-pay

## 2017-06-05 ENCOUNTER — Other Ambulatory Visit: Payer: Self-pay | Admitting: *Deleted

## 2017-06-05 DIAGNOSIS — Z1231 Encounter for screening mammogram for malignant neoplasm of breast: Secondary | ICD-10-CM | POA: Diagnosis not present

## 2017-06-05 DIAGNOSIS — M818 Other osteoporosis without current pathological fracture: Secondary | ICD-10-CM | POA: Insufficient documentation

## 2017-06-05 DIAGNOSIS — E2839 Other primary ovarian failure: Secondary | ICD-10-CM | POA: Insufficient documentation

## 2017-06-05 DIAGNOSIS — M85852 Other specified disorders of bone density and structure, left thigh: Secondary | ICD-10-CM | POA: Insufficient documentation

## 2017-06-05 DIAGNOSIS — Z1239 Encounter for other screening for malignant neoplasm of breast: Secondary | ICD-10-CM

## 2017-06-05 DIAGNOSIS — M810 Age-related osteoporosis without current pathological fracture: Secondary | ICD-10-CM | POA: Diagnosis not present

## 2017-06-05 HISTORY — PX: OTHER SURGICAL HISTORY: SHX169

## 2017-06-05 MED ORDER — ALENDRONATE SODIUM 70 MG PO TABS: 70.0000 mg | ORAL_TABLET | ORAL | 3 refills | Status: DC

## 2017-06-07 LAB — TOXASSURE SELECT,+ANTIDEPR,UR

## 2017-06-10 ENCOUNTER — Telehealth: Payer: Self-pay | Admitting: *Deleted

## 2017-06-10 NOTE — Telephone Encounter (Signed)
Urine drug screen for this encounter is consistent for prescribed medication 

## 2017-06-30 ENCOUNTER — Telehealth: Payer: Self-pay | Admitting: Physical Medicine & Rehabilitation

## 2017-07-02 DIAGNOSIS — M25562 Pain in left knee: Secondary | ICD-10-CM | POA: Diagnosis not present

## 2017-07-02 DIAGNOSIS — M25561 Pain in right knee: Secondary | ICD-10-CM | POA: Diagnosis not present

## 2017-07-02 DIAGNOSIS — M170 Bilateral primary osteoarthritis of knee: Secondary | ICD-10-CM | POA: Diagnosis not present

## 2017-07-04 NOTE — Telephone Encounter (Signed)
Error

## 2017-07-09 DIAGNOSIS — M25561 Pain in right knee: Secondary | ICD-10-CM | POA: Diagnosis not present

## 2017-07-09 DIAGNOSIS — M25562 Pain in left knee: Secondary | ICD-10-CM | POA: Diagnosis not present

## 2017-07-09 DIAGNOSIS — M170 Bilateral primary osteoarthritis of knee: Secondary | ICD-10-CM | POA: Diagnosis not present

## 2017-07-14 ENCOUNTER — Ambulatory Visit: Payer: Medicare Other | Admitting: Registered Nurse

## 2017-07-16 ENCOUNTER — Ambulatory Visit: Payer: Medicare Other | Admitting: Registered Nurse

## 2017-07-21 ENCOUNTER — Encounter: Payer: Self-pay | Admitting: Registered Nurse

## 2017-07-21 ENCOUNTER — Telehealth: Payer: Self-pay | Admitting: Registered Nurse

## 2017-07-21 ENCOUNTER — Encounter: Payer: Medicare Other | Attending: Physical Medicine & Rehabilitation | Admitting: Registered Nurse

## 2017-07-21 VITALS — BP 127/78 | HR 83

## 2017-07-21 DIAGNOSIS — M797 Fibromyalgia: Secondary | ICD-10-CM | POA: Diagnosis not present

## 2017-07-21 DIAGNOSIS — G894 Chronic pain syndrome: Secondary | ICD-10-CM

## 2017-07-21 DIAGNOSIS — M5416 Radiculopathy, lumbar region: Secondary | ICD-10-CM

## 2017-07-21 DIAGNOSIS — I209 Angina pectoris, unspecified: Secondary | ICD-10-CM | POA: Diagnosis not present

## 2017-07-21 DIAGNOSIS — Z5181 Encounter for therapeutic drug level monitoring: Secondary | ICD-10-CM | POA: Insufficient documentation

## 2017-07-21 DIAGNOSIS — M1711 Unilateral primary osteoarthritis, right knee: Secondary | ICD-10-CM | POA: Insufficient documentation

## 2017-07-21 DIAGNOSIS — Z79899 Other long term (current) drug therapy: Secondary | ICD-10-CM | POA: Diagnosis not present

## 2017-07-21 DIAGNOSIS — R2689 Other abnormalities of gait and mobility: Secondary | ICD-10-CM | POA: Insufficient documentation

## 2017-07-21 DIAGNOSIS — M1712 Unilateral primary osteoarthritis, left knee: Secondary | ICD-10-CM | POA: Diagnosis not present

## 2017-07-21 MED ORDER — OXYCODONE-ACETAMINOPHEN 10-325 MG PO TABS: 1.0000 | ORAL_TABLET | Freq: Four times a day (QID) | ORAL | 0 refills | Status: DC | PRN

## 2017-07-21 NOTE — Telephone Encounter (Signed)
On 07/21/2017 the  [place] was reviewed no conflict was seen on the [place] with multiple prescribers. Linda [name] has a signed narcotic contract with our office. If there were any discrepancies this would have been reported to her physician.

## 2017-07-21 NOTE — Progress Notes (Signed)
Subjective:    Patient ID: Linda [name], female    DOB: [birth year], 71 y.o.   MRN: [id number]  HPI: Linda [name] is a 71year old female who returns for follow up appointmentfor chronic pain and medication refill. She states her pain is located in herlower back radiating into her bilateral lower extremities posteriorlyand bilateral knee pain.She rates her pain 6. Her current exercise regime is performing stretching exercises, stationary peddle bicycle for upper and lower extremities( upper for 42minutes) three times a week. Using the Nu-step at her Facility three days a week for 20 minutes.   Last UDS was on 06/03/2017, it was consistent.  Pain Inventory Average Pain 6 Pain Right Now 6 My pain is intermittent, dull, stabbing and aching  In the last 24 hours, has pain interfered with the following? General activity 7 Relation with others 8 Enjoyment of life 9 What TIME of day is your pain at its worst? morning daytime and evening Sleep (in general) Fair  Pain is worse with: walking, bending, standing and some activites Pain improves with: rest and medication Relief from Meds: 6  Mobility use a walker how many minutes can you walk? 5-10 ability to climb steps?  no do you drive?  yes  Function retired I need assistance with the following:  meal prep, household duties and shopping  Neuro/Psych bladder control problems weakness trouble walking spasms anxiety  Prior Studies Any changes since last visit?  no  Physicians involved in your care Any changes since last visit?  no   Family History  Problem Relation Age of Onset  . Arthritis Mother   . Kidney disease Mother   . Heart disease Father   . Stroke Father   . Hypertension Father   . Diabetes Father   . Heart attack Father   . Heart attack Paternal Grandmother    Social History   Social History  . Marital status: Widowed    Spouse name: N/A  . Number of children: 2  . Years of  education: N/A   Occupational History  . Retired    Social History Main Topics  . Smoking status: Never Smoker  . Smokeless tobacco: Never Used     Comment: never used tobacco  . Alcohol use No  . Drug use: No  . Sexual activity: Not Currently   Other Topics Concern  . None   Social History Narrative   Widowed, 2 sons.  Relocated to [place] 09/2012 to be closer to her son who has MS.   Husband d 2015--mesothelioma.   Occupation: former Pharmacist, hospital.   Education: masters degree level.   No T/A/Ds.   Lives in [place], independent living.   Past Surgical History:  Procedure Laterality Date  . APPENDECTOMY  1960  . AXILLARY SURGERY Left 1978   Multiple "lump" in armpit per pt  . CARDIAC CATHETERIZATION  01/2013   nonobstructive CAD, EF 55-60%  . CARDIOVASCULAR STRESS TEST  02/22/15   Low risk myocard perf imaging; wall motion normal, normal EF  . COCCYX REMOVAL  1972  . DEXA  06/05/2017   T-score -3.1  . DILATION AND CURETTAGE OF UTERUS  ? 1970's  . ESOPHAGOGASTRODUODENOSCOPY N/A 07/19/2014   Gastritis found + in the setting of supratherapeutic INR, +plavix, + meloxicam.  . EYE SURGERY Left 2012-2013   "injections for ~ 1 yr; don't really know what for" (07/12/2013)  . HEEL SPUR SURGERY Left 2008  . KNEE SURGERY  2005  . LEFT  HEART CATHETERIZATION WITH CORONARY ANGIOGRAM N/A 01/30/2013   Procedure: LEFT HEART CATHETERIZATION WITH CORONARY ANGIOGRAM;  Surgeon: Clent Demark, MD;  Location: [facility] CATH LAB;  Service: Cardiovascular;  Laterality: N/A;  . PLANTAR FASCIA RELEASE Left 2008  . SPIROMETRY  04/25/14   In hosp for acute asthma/COPD flare: mixed obstructive and restrictive lung disease. The FEV1 is severely reduced at 45% predicted.  FEV1 signif decreased compared to prior spirometry 07/23/13.  . TENDON RELEASE  1996   Right forearm and hand  . TOTAL ABDOMINAL HYSTERECTOMY  1974  . TRANSTHORACIC ECHOCARDIOGRAM  01/2013; 04/2014;08/2015   2014--NORMAL.  2015--focal basal septal  hypertrophy, EF 55-60%, grade I diast dysfxn, mild LAE.  08/2015 EF 55-60%, nl LV syst fxn, grade I DD, valves wnl   Past Medical History:  Diagnosis Date  . Acute upper GI bleed 06/2014   while pt taking coumadin, plavix, and meloxicam---despite being told not to take coumadin.  . Anginal pain ([place])    Nonobstructive CAD 2014; however, her cardiologist put her on a statin for this and NOT for hyperlipidemia per pt report.  . Anxiety    panic attacks  . Asthma    w/ asbestososis   . BPPV (benign paroxysmal positional vertigo) 12/16/2012  . Chronic diastolic CHF (congestive heart failure) (HCC)    dry wt as of 11/06/16 is 168 lbs.  . Chronic lower back pain   . COPD (chronic obstructive pulmonary disease) ([place])   . DDD (degenerative disc disease)    lumbar and cervical.   . Depression   . Diverticular disease   . Fibromyalgia    Patient states dx was around her late 34s but she had sx's for years prior to this.  . H/O hiatal hernia   . Hay fever   . History of pneumonia    hospitalized 12/2011, 02/2013, and 07/2013 [facility]) for this  . HTN (hypertension)   . Idiopathic angio-edema-urticaria 72014   Angioedema component was very minimal  . Insomnia   . Iron deficiency anemia    Hematologist in [place], [place] did extensive w/u; no cause found; failed oral supplement;; gets fairly regular (q40m or so) IV iron infusions (Venofer -iron sucrose- 200mg  with procrit.  "for 14 yr I've been getting blood work q month & getting infusions prn" (07/12/2013).  Dr. Marin Olp locally, iron infusions done, EPO deficiency dx'd  . Migraine syndrome    "not as often anymore; used to be ~ q wk" (07/12/2013)  . Mixed incontinence urge and stress   . Nephrolithiasis    "passed all on my own or they are still in there" (07/12/2013)  . OSA on CPAP    prior to move to [place]--had another sleep study 10/2015 w/pulm Dr. Camillo Flaming.  . Osteoarthritis    "severe; progressing fast" (07/12/2013); multiple joints-not surgical  candidate for TKR (03/2015)  . Pernicious anemia 08/24/2014  . Pleural plaque with presence of asbestos 07/22/2013  . Pulmonary embolism ([place]) 07/2013   Dx at [facility] with very small peripheral upper lobe pe 07/2013: pt took coumadin for about 8-9 mo  . Pyelonephritis    "several times over the last 30 yr" (07/12/2013)  . RBBB (right bundle branch block)   . Recurrent UTI    hx of hospitalization for pyelonephritis; started abx prophylaxis 06/2015  . Syncope    Hypotensive; ED visit--Dr. Terrence Dupont did Cath--nonobstructive CAD, EF 55-60%.  In retrospect, suspect pt rx med misuse/polypharmacy  . Tension headache, chronic    BP 127/78 (BP  Location: Left Arm, Patient Position: Sitting, Cuff Size: Normal)   Pulse 83   SpO2 94%   Opioid Risk Score:  1 Fall Risk Score:  `1  Depression screen PHQ 2/9  Depression screen [facility] 2/9 07/21/2017 05/06/2017 08/01/2016 06/12/2016 04/19/2016 08/11/2015 07/12/2015  Decreased Interest 0 0 3 1 0 3 0  Down, Depressed, Hopeless 3 3 3  0 0 3 1  PHQ - 2 Score 3 3 6 1  0 6 1  Altered sleeping - - - - - 2 -  Tired, decreased energy - - - - - 3 -  Change in appetite - - - - - 0 -  Feeling bad or failure about yourself  - - - - - 2 -  Trouble concentrating - - - - - 0 -  Moving slowly or fidgety/restless - - - - - 0 -  Suicidal thoughts - - - - - 0 -  PHQ-9 Score - - - - - 13 -  Some recent data might be hidden    Review of Systems  Constitutional: Positive for diaphoresis and unexpected weight change.  HENT: Negative.   Eyes: Negative.   Respiratory: Positive for shortness of breath.   Cardiovascular: Positive for leg swelling.  Gastrointestinal: Positive for constipation and nausea.  Genitourinary:       Bladder control  Musculoskeletal: Positive for gait problem.       Spasms  Allergic/Immunologic: Negative.   Neurological: Positive for weakness.  Hematological: Negative.   Psychiatric/Behavioral: The patient is nervous/anxious.   All other systems reviewed and  are negative.      Objective:   Physical Exam  Constitutional: She is oriented to person, place, and time. She appears well-developed and well-nourished.  HENT:  Head: Normocephalic and atraumatic.  Neck: Normal range of motion. Neck supple.  Cardiovascular: Normal rate and regular rhythm.   Pulmonary/Chest: Effort normal and breath sounds normal.  Musculoskeletal:  Normal Muscle Bulk and Muscle Testing Reveals: Upper Extremities: Full ROM and Muscle Strength 5/5 Thoracic Hypersensitivity: T-1-T-3 Lumbar Paraspinal Tenderness: L-3-L-5 Lower Extremities: Full ROM and Muscle Strength 5/5 Arises from Table Slowly using Walker for Support Narrow Based Gait   Neurological: She is alert and oriented to person, place, and time.  Skin: Skin is warm and dry.  Psychiatric: She has a normal mood and affect.  Nursing note and vitals reviewed.         Assessment & Plan:  1. Functional deficits secondary to Gait disorder:Continue with HEP.07/21/2017 2. Chronic Back pain/ Lumbar Radiculitis/fibromyalgia/R>L Knee OA Pain Management: 07/21/2017 Refilled: Oxycodone 10/325mg one tablet every 6 hours #100/ Cont and Voltaren Gel We will continue the opioid monitoring program, this consists of regular clinic visits, examinations, urine drug screen, pill counts as well as use of [place] Controlled Substance reporting System. 3. Depression with anxiety/Grief reaction/Mood: 07/21/2017 Continue Xanax,Trazodone and Cymbalta .  4. [name] with asthma: Oxygen dependent. Albuterol prn.  On Continuous Oxygen Therapy: Wearing 3 liters EO:[id number] Following. 07/21/2017 5. Bilateral Osteoarthritis of Bilateral Knees: Dr. Juanda Bond Following. 07/21/2017 6. Fibromyalgia: Continue Lyrica andexercise regime. 07/21/2017  20 minutes of face to face patient care time was spent during this visit. All questions were encouraged and answered.    F/U in 1 month

## 2017-07-24 ENCOUNTER — Other Ambulatory Visit: Payer: Self-pay | Admitting: Family Medicine

## 2017-08-18 ENCOUNTER — Encounter: Payer: Medicare Other | Attending: Physical Medicine & Rehabilitation | Admitting: Registered Nurse

## 2017-08-18 ENCOUNTER — Encounter: Payer: Self-pay | Admitting: Registered Nurse

## 2017-08-18 VITALS — BP 135/85 | HR 68

## 2017-08-18 DIAGNOSIS — M1711 Unilateral primary osteoarthritis, right knee: Secondary | ICD-10-CM | POA: Diagnosis not present

## 2017-08-18 DIAGNOSIS — M5416 Radiculopathy, lumbar region: Secondary | ICD-10-CM

## 2017-08-18 DIAGNOSIS — Z79899 Other long term (current) drug therapy: Secondary | ICD-10-CM | POA: Insufficient documentation

## 2017-08-18 DIAGNOSIS — M1712 Unilateral primary osteoarthritis, left knee: Secondary | ICD-10-CM

## 2017-08-18 DIAGNOSIS — I209 Angina pectoris, unspecified: Secondary | ICD-10-CM | POA: Diagnosis not present

## 2017-08-18 DIAGNOSIS — Z5181 Encounter for therapeutic drug level monitoring: Secondary | ICD-10-CM | POA: Diagnosis not present

## 2017-08-18 DIAGNOSIS — M797 Fibromyalgia: Secondary | ICD-10-CM | POA: Insufficient documentation

## 2017-08-18 DIAGNOSIS — G894 Chronic pain syndrome: Secondary | ICD-10-CM

## 2017-08-18 DIAGNOSIS — R2689 Other abnormalities of gait and mobility: Secondary | ICD-10-CM | POA: Insufficient documentation

## 2017-08-18 MED ORDER — OXYCODONE-ACETAMINOPHEN 10-325 MG PO TABS: 1.0000 | ORAL_TABLET | Freq: Four times a day (QID) | ORAL | 0 refills | Status: DC | PRN

## 2017-08-18 NOTE — Progress Notes (Signed)
Subjective:    Patient ID: Linda [name], female    DOB: [birth year], 71 y.o.   MRN: [id number]  HPI: [name] is a 71year old female who returns for follow up appointmentfor chronic pain and medication refill. She states her pain is located in herupper- lower back radiating into her bilateral lower extremities posteriorlyand bilateral knee pain.She rates her pain 6. Her current exercise regime is performing stretching exercises, stationary peddle bicycle for upper and lower extremities ( upper for 73minutes) three times a week. Using the Nu-step at her Facility three days a week for 20 minutes.   Ms. Shimon and son going on vacation on August 29,2018 to the [place] for ten days  leaving on Wednesday 08/20/2017. Will allow her to pick up her prescription early, she was instructed to fill her pill organizer and lock up the remaining oxycodone. She verbalizes understanding.   Last UDS was on 06/03/2017, it was consistent.  Pain Inventory Average Pain 8 Pain Right Now 6 My pain is intermittent, sharp, burning and aching  In the last 24 hours, has pain interfered with the following? General activity 8 Relation with others 8 Enjoyment of life 10 What TIME of day is your pain at its worst? morning daytime and evening Sleep (in general) Fair  Pain is worse with: walking, bending, standing and some activites Pain improves with: medication Relief from Meds: 5  Mobility walk with assistance use a walker how many minutes can you walk? 5-10 ability to climb steps?  no do you drive?  yes  Function retired I need assistance with the following:  dressing, meal prep, household duties and shopping  Neuro/Psych bladder control problems weakness trouble walking anxiety  Prior Studies Any changes since last visit?  no  Physicians involved in your care Any changes since last visit?  no   Family History  Problem Relation Age of Onset  . Arthritis  Mother   . Kidney disease Mother   . Heart disease Father   . Stroke Father   . Hypertension Father   . Diabetes Father   . Heart attack Father   . Heart attack Paternal Grandmother    Social History   Social History  . Marital status: Widowed    Spouse name: N/A  . Number of children: 2  . Years of education: N/A   Occupational History  . Retired    Social History Main Topics  . Smoking status: Never Smoker  . Smokeless tobacco: Never Used     Comment: never used tobacco  . Alcohol use No  . Drug use: No  . Sexual activity: Not Currently   Other Topics Concern  . None   Social History Narrative   Widowed, 2 sons.  Relocated to [place] 09/2012 to be closer to her son who has MS.   Husband d 2015--mesothelioma.   Occupation: former Pharmacist, hospital.   Education: masters degree level.   No T/A/Ds.   Lives in [place], independent living.   Past Surgical History:  Procedure Laterality Date  . APPENDECTOMY  1960  . AXILLARY SURGERY Left 1978   Multiple "lump" in armpit per pt  . CARDIAC CATHETERIZATION  01/2013   nonobstructive CAD, EF 55-60%  . CARDIOVASCULAR STRESS TEST  02/22/15   Low risk myocard perf imaging; wall motion normal, normal EF  . COCCYX REMOVAL  1972  . DEXA  06/05/2017   T-score -3.1  . DILATION AND CURETTAGE OF UTERUS  ? 1970's  .  ESOPHAGOGASTRODUODENOSCOPY N/A 07/19/2014   Gastritis found + in the setting of supratherapeutic INR, +plavix, + meloxicam.  . EYE SURGERY Left 2012-2013   "injections for ~ 1 yr; don't really know what for" (07/12/2013)  . HEEL SPUR SURGERY Left 2008  . KNEE SURGERY  2005  . LEFT HEART CATHETERIZATION WITH CORONARY ANGIOGRAM N/A 01/30/2013   Procedure: LEFT HEART CATHETERIZATION WITH CORONARY ANGIOGRAM;  Surgeon: Clent Demark, MD;  Location: [facility] CATH LAB;  Service: Cardiovascular;  Laterality: N/A;  . PLANTAR FASCIA RELEASE Left 2008  . SPIROMETRY  04/25/14   In hosp for acute asthma/COPD flare: mixed obstructive and  restrictive lung disease. The FEV1 is severely reduced at 45% predicted.  FEV1 signif decreased compared to prior spirometry 07/23/13.  . TENDON RELEASE  1996   Right forearm and hand  . TOTAL ABDOMINAL HYSTERECTOMY  1974  . TRANSTHORACIC ECHOCARDIOGRAM  01/2013; 04/2014;08/2015   2014--NORMAL.  2015--focal basal septal hypertrophy, EF 55-60%, grade I diast dysfxn, mild LAE.  08/2015 EF 55-60%, nl LV syst fxn, grade I DD, valves wnl   Past Medical History:  Diagnosis Date  . Acute upper GI bleed 06/2014   while pt taking coumadin, plavix, and meloxicam---despite being told not to take coumadin.  . Anginal pain ([place])    Nonobstructive CAD 2014; however, her cardiologist put her on a statin for this and NOT for hyperlipidemia per pt report.  . Anxiety    panic attacks  . Asthma    w/ asbestososis   . BPPV (benign paroxysmal positional vertigo) 12/16/2012  . Chronic diastolic CHF (congestive heart failure) (HCC)    dry wt as of 11/06/16 is 168 lbs.  . Chronic lower back pain   . COPD (chronic obstructive pulmonary disease) ([place])   . DDD (degenerative disc disease)    lumbar and cervical.   . Depression   . Diverticular disease   . Fibromyalgia    Patient states dx was around her late 21s but she had sx's for years prior to this.  . H/O hiatal hernia   . Hay fever   . History of pneumonia    hospitalized 12/2011, 02/2013, and 07/2013 [facility]) for this  . HTN (hypertension)   . Idiopathic angio-edema-urticaria 72014   Angioedema component was very minimal  . Insomnia   . Iron deficiency anemia    Hematologist in [place], [place] did extensive w/u; no cause found; failed oral supplement;; gets fairly regular (q64m or so) IV iron infusions (Venofer -iron sucrose- 200mg  with procrit.  "for 14 yr I've been getting blood work q month & getting infusions prn" (07/12/2013).  Dr. Marin Olp locally, iron infusions done, EPO deficiency dx'd  . Migraine syndrome    "not as often anymore; used to be ~ q wk"  (07/12/2013)  . Mixed incontinence urge and stress   . Nephrolithiasis    "passed all on my own or they are still in there" (07/12/2013)  . OSA on CPAP    prior to move to [place]--had another sleep study 10/2015 w/pulm Dr. Camillo Flaming.  . Osteoarthritis    "severe; progressing fast" (07/12/2013); multiple joints-not surgical candidate for TKR (03/2015)  . Pernicious anemia 08/24/2014  . Pleural plaque with presence of asbestos 07/22/2013  . Pulmonary embolism ([place]) 07/2013   Dx at [facility] with very small peripheral upper lobe pe 07/2013: pt took coumadin for about 8-9 mo  . Pyelonephritis    "several times over the last 30 yr" (07/12/2013)  . RBBB (right bundle branch  block)   . Recurrent UTI    hx of hospitalization for pyelonephritis; started abx prophylaxis 06/2015  . Syncope    Hypotensive; ED visit--Dr. Terrence Dupont did Cath--nonobstructive CAD, EF 55-60%.  In retrospect, suspect pt rx med misuse/polypharmacy  . Tension headache, chronic    BP 135/85 (BP Location: Left Arm, Patient Position: Sitting, Cuff Size: Normal)   Pulse 68   SpO2 93%   Opioid Risk Score:  1 Fall Risk Score:  `1  Depression screen PHQ 2/9  Depression screen [facility] 2/9 08/18/2017 07/21/2017 05/06/2017 08/01/2016 06/12/2016 04/19/2016 08/11/2015  Decreased Interest 0 0 0 3 1 0 3  Down, Depressed, Hopeless 3 3 3 3  0 0 3  PHQ - 2 Score 3 3 3 6 1  0 6  Altered sleeping - - - - - - 2  Tired, decreased energy - - - - - - 3  Change in appetite - - - - - - 0  Feeling bad or failure about yourself  - - - - - - 2  Trouble concentrating - - - - - - 0  Moving slowly or fidgety/restless - - - - - - 0  Suicidal thoughts - - - - - - 0  PHQ-9 Score - - - - - - 13  Some recent data might be hidden    Review of Systems  Constitutional: Positive for diaphoresis.  HENT: Negative.   Eyes: Negative.   Respiratory: Positive for cough and shortness of breath.   Cardiovascular: Negative.   Gastrointestinal: Positive for constipation and nausea.    Endocrine: Negative.   Genitourinary:       Bladder control  Musculoskeletal: Positive for gait problem.  Skin: Negative.   Allergic/Immunologic: Negative.   Neurological: Positive for weakness.  Hematological: Negative.   Psychiatric/Behavioral: The patient is nervous/anxious.   All other systems reviewed and are negative.      Objective:   Physical Exam  Constitutional: She appears well-developed and well-nourished.  HENT:  Head: Normocephalic and atraumatic.  Musculoskeletal:  Normal Muscle Bulk and Muscle Testing Reveals: Upper Extremities: Full ROM and Muscle Strength 5/5 Thoracic Paraspinal Tenderness: T-7-T-9 Lumbar Paraspinal Tenderness: L-3-L-5 Lower Extremities: Full ROM and Muscle Strength 5/5 Arises from Table Slowly using walker for support. Narrow Based Gait  Nursing note and vitals reviewed.         Assessment & Plan:  1. Functional deficits secondary to Gait disorder:Continue with HEP.08/18/2017 2. Chronic Back pain/ Lumbar Radiculitis/fibromyalgia/R>L Knee OA Pain Management: 08/18/2017 Refilled: Oxycodone 10/325mg one tablet every 6 hours #100/ Cont and Voltaren Gel We will continue the opioid monitoring program, this consists of regular clinic visits, examinations, urine drug screen, pill counts as well as use of [place] Controlled Substance reporting System. 3. Depression with anxiety/Grief reaction/Mood: 08/18/2017 Continue Xanax,Trazodone and Cymbalta .  4. [name] with asthma: Oxygen dependent. Albuterol prn.  On Continuous Oxygen Therapy: Wearing 3 liters UT:[id number] Following. 08/18/2017 5. Bilateral Osteoarthritis of Bilateral Knees: Dr. Juanda Bond Following. 08/18/2017 6. Fibromyalgia: Continue Lyrica andexercise regime. 08/18/2017  20 minutes of face to face patient care time was spent during this visit. All questions were encouraged and answered.  F/U in 1 month

## 2017-09-01 NOTE — Progress Notes (Deleted)
HPI: FU diastolic congestive heart failure. Cardiac catheterization February 2014 showed nonobstructive disease (most significant 50-60 RCA) and normal LV function; note of 4+ MR. Renal Dopplers May 2014 showed no stenosis. Nuclear study March 2016 showed ejection fraction 96% and no ischemia or infarction. Also with history of presumed orthostatic syncope. Last echocardiogram June 2017 showed normal LV systolic function and grade 1 diastolic dysfunction. She has had problems with diastolic CHF. Since last seen,   Current Outpatient Prescriptions  Medication Sig Dispense Refill  . acetaminophen (TYLENOL) 325 MG tablet Take 2 tablets (650 mg total) by mouth every 4 (four) hours as needed for headache or mild pain.    [name] Kitchen albuterol (PROVENTIL HFA;VENTOLIN HFA) 108 (90 BASE) MCG/ACT inhaler Inhale 2 puffs into the lungs every 6 (six) hours as needed for wheezing or shortness of breath. 1 Inhaler 1  . alendronate (FOSAMAX) 70 MG tablet Take 1 tablet (70 mg total) by mouth every 7 (seven) days. Take with a full glass of water on an empty stomach, first thing in the morning and remain up right for 30 minutes after taking. 12 tablet 3  . ALPRAZolam (XANAX) 1 MG tablet TAKE 1 TABLET THREE TIMES DAILY AS NEEDED FOR ANXIETY. 90 tablet 5  . ARIPiprazole (ABILIFY) 10 MG tablet TAKE 1 TABLET ONCE DAILY. 90 tablet 3  . aspirin 81 MG tablet Take 81 mg by mouth at bedtime.    . diclofenac sodium (VOLTAREN) 1 % GEL Apply 2 g topically 3 (three) times daily as needed (pain). 2 Tube 2  . DULoxetine (CYMBALTA) 30 MG capsule Take 30 mg by mouth at bedtime.    . DULoxetine (CYMBALTA) 60 MG capsule TAKE 1 CAPSULE A DAY TO BE COMBINED WITH 30MG  CAPSULE 90 capsule 1  . furosemide (LASIX) 40 MG tablet Take 1 tablet (40 mg total) by mouth daily. 60 tablet 11  . isosorbide mononitrate (IMDUR) 30 MG 24 hr tablet Take 1 tablet (30 mg total) by mouth 2 (two) times daily. 60 tablet 3  . metoprolol succinate (TOPROL XL) 25 MG  24 hr tablet Take 1 tablet (25 mg total) by mouth 2 (two) times daily. 60 tablet 11  . Multiple Vitamin (MULTIVITAMIN WITH MINERALS) TABS tablet Take 1 tablet by mouth daily.    . nitroGLYCERIN (NITROSTAT) 0.4 MG SL tablet Place 1 tablet (0.4 mg total) under the tongue every 5 (five) minutes as needed for chest pain (x 3 doses). 25 tablet 3  . oxyCODONE-acetaminophen (PERCOCET) 10-325 MG tablet Take 1 tablet by mouth every 6 (six) hours as needed for pain. 100 tablet 0  . OXYGEN Inhale 3 L into the lungs continuous.    . pantoprazole (PROTONIX) 40 MG tablet TAKE 1 TABLET DAILY. 90 tablet 3  . polyethylene glycol (MIRALAX / GLYCOLAX) packet Take 17 g by mouth daily as needed (constipation). Mix in 8 oz liquid and drink    . potassium chloride SA (K-DUR,KLOR-CON) 20 MEQ tablet Take 20 mEq by mouth as directed. Take 2 tablets by mouth in the morning and tablet in the evening    . traZODone (DESYREL) 50 MG tablet TAKE 2-4 TABLETS AT BEDTIME 120 tablet 6  . Vitamin D, Ergocalciferol, (DRISDOL) 50000 units CAPS capsule 1 tab po TWICE per week 24 capsule 3   No current facility-administered medications for this visit.    Facility-Administered Medications Ordered in Other Visits  Medication Dose Route Frequency Provider Last Rate Last Dose  . ferumoxytol (FERAHEME) 510 mg  in sodium chloride 0.9 % 100 mL IVPB  510 mg Intravenous Once Volanda Napoleon, MD         Past Medical History:  Diagnosis Date  . Acute upper GI bleed 06/2014   while pt taking coumadin, plavix, and meloxicam---despite being told not to take coumadin.  . Anginal pain ([place])    Nonobstructive CAD 2014; however, her cardiologist put her on a statin for this and NOT for hyperlipidemia per pt report.  . Anxiety    panic attacks  . Asthma    w/ asbestososis   . BPPV (benign paroxysmal positional vertigo) 12/16/2012  . Chronic diastolic CHF (congestive heart failure) (HCC)    dry wt as of 11/06/16 is 168 lbs.  . Chronic lower back  pain   . COPD (chronic obstructive pulmonary disease) ([place])   . DDD (degenerative disc disease)    lumbar and cervical.   . Depression   . Diverticular disease   . Fibromyalgia    Patient states dx was around her late 23s but she had sx's for years prior to this.  . H/O hiatal hernia   . Hay fever   . History of pneumonia    hospitalized 12/2011, 02/2013, and 07/2013 [facility]) for this  . HTN (hypertension)   . Idiopathic angio-edema-urticaria 72014   Angioedema component was very minimal  . Insomnia   . Iron deficiency anemia    Hematologist in [place], [place] did extensive w/u; no cause found; failed oral supplement;; gets fairly regular (q38m or so) IV iron infusions (Venofer -iron sucrose- 200mg  with procrit.  "for 14 yr I've been getting blood work q month & getting infusions prn" (07/12/2013).  Dr. Marin Olp locally, iron infusions done, EPO deficiency dx'd  . Migraine syndrome    "not as often anymore; used to be ~ q wk" (07/12/2013)  . Mixed incontinence urge and stress   . Nephrolithiasis    "passed all on my own or they are still in there" (07/12/2013)  . OSA on CPAP    prior to move to [place]--had another sleep study 10/2015 w/pulm Dr. Camillo Flaming.  . Osteoarthritis    "severe; progressing fast" (07/12/2013); multiple joints-not surgical candidate for TKR (03/2015)  . Pernicious anemia 08/24/2014  . Pleural plaque with presence of asbestos 07/22/2013  . Pulmonary embolism ([place]) 07/2013   Dx at [facility] with very small peripheral upper lobe pe 07/2013: pt took coumadin for about 8-9 mo  . Pyelonephritis    "several times over the last 30 yr" (07/12/2013)  . RBBB (right bundle branch block)   . Recurrent UTI    hx of hospitalization for pyelonephritis; started abx prophylaxis 06/2015  . Syncope    Hypotensive; ED visit--Dr. Terrence Dupont did Cath--nonobstructive CAD, EF 55-60%.  In retrospect, suspect pt rx med misuse/polypharmacy  . Tension headache, chronic     Past Surgical History:  Procedure Laterality  Date  . APPENDECTOMY  1960  . AXILLARY SURGERY Left 1978   Multiple "lump" in armpit per pt  . CARDIAC CATHETERIZATION  01/2013   nonobstructive CAD, EF 55-60%  . CARDIOVASCULAR STRESS TEST  02/22/15   Low risk myocard perf imaging; wall motion normal, normal EF  . COCCYX REMOVAL  1972  . DEXA  06/05/2017   T-score -3.1  . DILATION AND CURETTAGE OF UTERUS  ? 1970's  . ESOPHAGOGASTRODUODENOSCOPY N/A 07/19/2014   Gastritis found + in the setting of supratherapeutic INR, +plavix, + meloxicam.  . EYE SURGERY Left 2012-2013   "injections  for ~ 1 yr; don't really know what for" (07/12/2013)  . HEEL SPUR SURGERY Left 2008  . KNEE SURGERY  2005  . LEFT HEART CATHETERIZATION WITH CORONARY ANGIOGRAM N/A 01/30/2013   Procedure: LEFT HEART CATHETERIZATION WITH CORONARY ANGIOGRAM;  Surgeon: Clent Demark, MD;  Location: [facility] CATH LAB;  Service: Cardiovascular;  Laterality: N/A;  . PLANTAR FASCIA RELEASE Left 2008  . SPIROMETRY  04/25/14   In hosp for acute asthma/COPD flare: mixed obstructive and restrictive lung disease. The FEV1 is severely reduced at 45% predicted.  FEV1 signif decreased compared to prior spirometry 07/23/13.  . TENDON RELEASE  1996   Right forearm and hand  . TOTAL ABDOMINAL HYSTERECTOMY  1974  . TRANSTHORACIC ECHOCARDIOGRAM  01/2013; 04/2014;08/2015   2014--NORMAL.  2015--focal basal septal hypertrophy, EF 55-60%, grade I diast dysfxn, mild LAE.  08/2015 EF 55-60%, nl LV syst fxn, grade I DD, valves wnl    Social History   Social History  . Marital status: Widowed    Spouse name: N/A  . Number of children: 2  . Years of education: N/A   Occupational History  . Retired    Social History Main Topics  . Smoking status: Never Smoker  . Smokeless tobacco: Never Used     Comment: never used tobacco  . Alcohol use No  . Drug use: No  . Sexual activity: Not Currently   Other Topics Concern  . Not on file   Social History Narrative   Widowed, 2 sons.  Relocated to [place] 09/2012 to  be closer to her son who has MS.   Husband d 2015--mesothelioma.   Occupation: former Pharmacist, hospital.   Education: masters degree level.   No T/A/Ds.   Lives in [place], independent living.    Family History  Problem Relation Age of Onset  . Arthritis Mother   . Kidney disease Mother   . Heart disease Father   . Stroke Father   . Hypertension Father   . Diabetes Father   . Heart attack Father   . Heart attack Paternal Grandmother     ROS: no fevers or chills, productive cough, hemoptysis, dysphasia, odynophagia, melena, hematochezia, dysuria, hematuria, rash, seizure activity, orthopnea, PND, pedal edema, claudication. Remaining systems are negative.  Physical Exam: Well-developed well-nourished in no acute distress.  Skin is warm and dry.  HEENT is normal.  Neck is supple.  Chest is clear to auscultation with normal expansion.  Cardiovascular exam is regular rate and rhythm.  Abdominal exam nontender or distended. No masses palpated. Extremities show no edema. neuro grossly intact  ECG- personally reviewed  A/P  1  [name], MD

## 2017-09-05 ENCOUNTER — Ambulatory Visit: Payer: Medicare Other | Admitting: Cardiology

## 2017-09-10 DIAGNOSIS — M25462 Effusion, left knee: Secondary | ICD-10-CM | POA: Diagnosis not present

## 2017-09-10 DIAGNOSIS — M25562 Pain in left knee: Secondary | ICD-10-CM | POA: Diagnosis not present

## 2017-09-10 DIAGNOSIS — M170 Bilateral primary osteoarthritis of knee: Secondary | ICD-10-CM | POA: Diagnosis not present

## 2017-09-10 DIAGNOSIS — M25561 Pain in right knee: Secondary | ICD-10-CM | POA: Diagnosis not present

## 2017-09-16 ENCOUNTER — Ambulatory Visit (INDEPENDENT_AMBULATORY_CARE_PROVIDER_SITE_OTHER): Payer: Medicare Other | Admitting: Physician Assistant

## 2017-09-16 ENCOUNTER — Encounter: Payer: Self-pay | Admitting: Physician Assistant

## 2017-09-16 VITALS — BP 130/82 | HR 78 | Ht 63.0 in | Wt 157.4 lb

## 2017-09-16 DIAGNOSIS — I209 Angina pectoris, unspecified: Secondary | ICD-10-CM

## 2017-09-16 DIAGNOSIS — I5032 Chronic diastolic (congestive) heart failure: Secondary | ICD-10-CM | POA: Diagnosis not present

## 2017-09-16 DIAGNOSIS — R079 Chest pain, unspecified: Secondary | ICD-10-CM | POA: Diagnosis not present

## 2017-09-16 DIAGNOSIS — I10 Essential (primary) hypertension: Secondary | ICD-10-CM

## 2017-09-16 MED ORDER — ISOSORBIDE MONONITRATE ER 30 MG PO TB24: 30.0000 mg | ORAL_TABLET | Freq: Two times a day (BID) | ORAL | 3 refills | Status: DC

## 2017-09-16 NOTE — Patient Instructions (Addendum)
Medication Instructions:  IMDUR 60MG  IN THE AM AND 30MG  IN THE PM If you need a refill on your cardiac medications before your next appointment, please call your pharmacy.  Testing/Procedures: Your physician has requested that you have a lexiscan myoview. For further information please visit [email or web address]. Please follow instruction sheet, as given.  Follow-Up: Your physician wants you to follow-up in: LATE November WITH RHODA PA-C -and- AS PLANNED IN February 2019 WITH DR CRENSHAW. You should receive a reminder letter in the mail two months in advance. If you do not receive a letter, please call our office December 2019 to schedule the February 2019 follow-up appointment.   Thank you for choosing [facility] at [facility]!!

## 2017-09-16 NOTE — Progress Notes (Signed)
Cardiology Office Note   Date:  09/16/2017   ID:  Linda [name], DOB February 09, 1946, MRN [id number]  PCP:  Tammi Sou, MD  Cardiologist:  Dr. Stanford Breed, 02/03/2017  Rosaria Ferries, PA-C 06/02/2017  Chief Complaint  Patient presents with  . Follow-up    History of Present Illness: Linda [name] is a 71 y.o. female with a history of BPPV, anxiety and depression,diastolic CHF,asbestosis, COPD on O2 3 lpm, HTN, mod CAD by cath 2014 -med rx, fibromyalgia w/ chronic pain issues. RBBB and inf Q waves on ECG, echo 05/2016 w/ nl EF, grade 1 dd  06/02/2017 office visit, patient using less oxygen, taking in a Lasix tablet as needed, chest pain treated with nitroglycerin successfully, Imdur increased  Linda [name] presents for cardiology follow up.  She is getting chest pain about once a week, treating it with nitro, 2-3 tabs each time. She feels this is mostly with exertion. It reaches an 8/10. It resolves with nitro. It is a smothering tightness, there is also prickly feeling that is in the same area (mid-sternal) and stays there even when the tightness goes away.   She is still having some problems with LE edema. Her weight at home is 155-157 lbs. She will take a second Lasix once or twice a week for swelling. She is doing very well with a low sodium diet. She does not usually wake with LE edema, but will upon occasion.   She gets SOB w/ exertion. Her exertion level is limited by her knee pain. She feels she cannot walk much over 5 minutes without her knees feeling like they are going to buckle and the pain is very bad.   She was put on Vit D and Fosamax, one or both of these is bothering her stomach.    Past Medical History:  Diagnosis Date  . Acute upper GI bleed 06/2014   while pt taking coumadin, plavix, and meloxicam---despite being told not to take coumadin.  . Anginal pain ([place])    Nonobstructive CAD 2014; however, her cardiologist put her on a statin for  this and NOT for hyperlipidemia per pt report.  . Anxiety    panic attacks  . Asthma    w/ asbestososis   . BPPV (benign paroxysmal positional vertigo) 12/16/2012  . Chronic diastolic CHF (congestive heart failure) (HCC)    dry wt as of 11/06/16 is 168 lbs.  . Chronic lower back pain   . COPD (chronic obstructive pulmonary disease) ([place])   . DDD (degenerative disc disease)    lumbar and cervical.   . Depression   . Diverticular disease   . Fibromyalgia    Patient states dx was around her late 64s but she had sx's for years prior to this.  . H/O hiatal hernia   . Hay fever   . History of pneumonia    hospitalized 12/2011, 02/2013, and 07/2013 [facility]) for this  . HTN (hypertension)   . Idiopathic angio-edema-urticaria 72014   Angioedema component was very minimal  . Insomnia   . Iron deficiency anemia    Hematologist in [place], [place] did extensive w/u; no cause found; failed oral supplement;; gets fairly regular (q2m or so) IV iron infusions (Venofer -iron sucrose- 200mg  with procrit.  "for 14 yr I've been getting blood work q month & getting infusions prn" (07/12/2013).  Dr. Marin Olp locally, iron infusions done, EPO deficiency dx'd  . Migraine syndrome    "not as often anymore; used to be ~  q wk" (07/12/2013)  . Mixed incontinence urge and stress   . Nephrolithiasis    "passed all on my own or they are still in there" (07/12/2013)  . OSA on CPAP    prior to move to [place]--had another sleep study 10/2015 w/pulm Dr. Camillo Flaming.  . Osteoarthritis    "severe; progressing fast" (07/12/2013); multiple joints-not surgical candidate for TKR (03/2015)  . Pernicious anemia 08/24/2014  . Pleural plaque with presence of asbestos 07/22/2013  . Pulmonary embolism ([place]) 07/2013   Dx at [facility] with very small peripheral upper lobe pe 07/2013: pt took coumadin for about 8-9 mo  . Pyelonephritis    "several times over the last 30 yr" (07/12/2013)  . RBBB (right bundle branch block)   . Recurrent UTI    hx of  hospitalization for pyelonephritis; started abx prophylaxis 06/2015  . Syncope    Hypotensive; ED visit--Dr. Terrence Dupont did Cath--nonobstructive CAD, EF 55-60%.  In retrospect, suspect pt rx med misuse/polypharmacy  . Tension headache, chronic     Past Surgical History:  Procedure Laterality Date  . APPENDECTOMY  1960  . AXILLARY SURGERY Left 1978   Multiple "lump" in armpit per pt  . CARDIAC CATHETERIZATION  01/2013   nonobstructive CAD, EF 55-60%  . CARDIOVASCULAR STRESS TEST  02/22/15   Low risk myocard perf imaging; wall motion normal, normal EF  . COCCYX REMOVAL  1972  . DEXA  06/05/2017   T-score -3.1  . DILATION AND CURETTAGE OF UTERUS  ? 1970's  . ESOPHAGOGASTRODUODENOSCOPY N/A 07/19/2014   Gastritis found + in the setting of supratherapeutic INR, +plavix, + meloxicam.  . EYE SURGERY Left 2012-2013   "injections for ~ 1 yr; don't really know what for" (07/12/2013)  . HEEL SPUR SURGERY Left 2008  . KNEE SURGERY  2005  . LEFT HEART CATHETERIZATION WITH CORONARY ANGIOGRAM N/A 01/30/2013   Procedure: LEFT HEART CATHETERIZATION WITH CORONARY ANGIOGRAM;  Surgeon: Clent Demark, MD;  Location: [facility] CATH LAB;  Service: Cardiovascular;  Laterality: N/A;  . PLANTAR FASCIA RELEASE Left 2008  . SPIROMETRY  04/25/14   In hosp for acute asthma/COPD flare: mixed obstructive and restrictive lung disease. The FEV1 is severely reduced at 45% predicted.  FEV1 signif decreased compared to prior spirometry 07/23/13.  . TENDON RELEASE  1996   Right forearm and hand  . TOTAL ABDOMINAL HYSTERECTOMY  1974  . TRANSTHORACIC ECHOCARDIOGRAM  01/2013; 04/2014;08/2015   2014--NORMAL.  2015--focal basal septal hypertrophy, EF 55-60%, grade I diast dysfxn, mild LAE.  08/2015 EF 55-60%, nl LV syst fxn, grade I DD, valves wnl    Current Outpatient Prescriptions  Medication Sig Dispense Refill  . albuterol (PROVENTIL HFA;VENTOLIN HFA) 108 (90 BASE) MCG/ACT inhaler Inhale 2 puffs into the lungs every 6 (six) hours as needed  for wheezing or shortness of breath. 1 Inhaler 1  . alendronate (FOSAMAX) 70 MG tablet Take 1 tablet (70 mg total) by mouth every 7 (seven) days. Take with a full glass of water on an empty stomach, first thing in the morning and remain up right for 30 minutes after taking. 12 tablet 3  . ALPRAZolam (XANAX) 1 MG tablet TAKE 1 TABLET THREE TIMES DAILY AS NEEDED FOR ANXIETY. 90 tablet 5  . ARIPiprazole (ABILIFY) 10 MG tablet TAKE 1 TABLET ONCE DAILY. 90 tablet 3  . aspirin 81 MG tablet Take 81 mg by mouth at bedtime.    . diclofenac sodium (VOLTAREN) 1 % GEL Apply 2 g topically 3 (three) times  daily as needed (pain). 2 Tube 2  . DULoxetine (CYMBALTA) 30 MG capsule Take 30 mg by mouth at bedtime.    . DULoxetine (CYMBALTA) 60 MG capsule TAKE 1 CAPSULE A DAY TO BE COMBINED WITH 30MG  CAPSULE 90 capsule 1  . furosemide (LASIX) 40 MG tablet Take 1 tablet (40 mg total) by mouth daily. 60 tablet 11  . isosorbide mononitrate (IMDUR) 30 MG 24 hr tablet Take 1 tablet (30 mg total) by mouth 2 (two) times daily. 60 tablet 3  . metoprolol succinate (TOPROL XL) 25 MG 24 hr tablet Take 1 tablet (25 mg total) by mouth 2 (two) times daily. 60 tablet 11  . Multiple Vitamin (MULTIVITAMIN WITH MINERALS) TABS tablet Take 1 tablet by mouth daily.    . nitroGLYCERIN (NITROSTAT) 0.4 MG SL tablet Place 1 tablet (0.4 mg total) under the tongue every 5 (five) minutes as needed for chest pain (x 3 doses). 25 tablet 3  . oxyCODONE-acetaminophen (PERCOCET) 10-325 MG tablet Take 1 tablet by mouth every 6 (six) hours as needed for pain. 100 tablet 0  . OXYGEN Inhale 3 L into the lungs continuous.    . pantoprazole (PROTONIX) 40 MG tablet TAKE 1 TABLET DAILY. 90 tablet 3  . polyethylene glycol (MIRALAX / GLYCOLAX) packet Take 17 g by mouth daily as needed (constipation). Mix in 8 oz liquid and drink    . potassium chloride SA (K-DUR,KLOR-CON) 20 MEQ tablet Take 20 mEq by mouth as directed. Take 2 tablets by mouth in the morning and  tablet in the evening    . traZODone (DESYREL) 50 MG tablet TAKE 2-4 TABLETS AT BEDTIME 120 tablet 6  . Vitamin D, Ergocalciferol, (DRISDOL) 50000 units CAPS capsule 1 tab po TWICE per week 24 capsule 3   No current facility-administered medications for this visit.    Facility-Administered Medications Ordered in Other Visits  Medication Dose Route Frequency Provider Last Rate Last Dose  . ferumoxytol (FERAHEME) 510 mg in sodium chloride 0.9 % 100 mL IVPB  510 mg Intravenous Once Ennever, Rudell Cobb, MD        Allergies:   Penicillins    Social History:  The patient  reports that she has never smoked. She has never used smokeless tobacco. She reports that she does not drink alcohol or use drugs.   Family History:  The patient's family history includes Arthritis in her mother; Diabetes in her father; Heart attack in her father and paternal grandmother; Heart disease in her father; Hypertension in her father; Kidney disease in her mother; Stroke in her father.    ROS:  Please see the history of present illness. All other systems are reviewed and negative.    PHYSICAL EXAM: VS:  BP 130/82   Pulse 78   Ht 5\' 3"  (1.6 m)   Wt 157 lb 6.4 oz (71.4 kg)   BMI 27.88 kg/m  , BMI Body mass index is 27.88 kg/m. GEN: Well nourished, well developed, female in no acute distress  HEENT: normal for age  Neck: minimal JVD, no carotid bruit, no masses Cardiac: RRR; soft murmur, no rubs, or gallops Respiratory: decreased BS bases bilaterally, few rales, normal work of breathing GI: soft, nontender, nondistended, + BS MS: no deformity or atrophy; no edema; distal pulses are 2+ in all 4 extremities   Skin: warm and dry, no rash Neuro:  Strength and sensation are intact Psych: euthymic mood, full affect   EKG:  EKG is ordered today. The ekg ordered today  demonstrates SR, RBBB is old. No acute changes.  No change from 05/2017  MYOVIEW: 02/22/2015 FINDINGS: Perfusion: No decreased activity in the  left ventricle on stressimaging to  suggest reversible ischemia or infarction. Wall Motion: Normal left ventricular wall motion. No left ventricular dilation. Left Ventricular Ejection Fraction: 96 % End diastolic volume 46 ml End systolic volume 2 ml IMPRESSION: 1. No reversible ischemia or infarction. 2. Normal left ventricular wall motion. 3. Left ventricular ejection fraction 96%. Ejection fractions are typical overestimated with this technique and small cardiac volumes. 4. Low-risk stress test findings*.  ECHO: 08/24/2015 - Left ventricle: The cavity size was normal. Systolic function was   normal. The estimated ejection fraction was in the range of 60%   to 65%. Wall motion was normal; there were no regional wall   motion abnormalities. There was an increased relative   contribution of atrial contraction to ventricular filling.   Doppler parameters are consistent with abnormal left ventricular   relaxation (grade 1 diastolic dysfunction). - Tricuspid valve: There was trivial regurgitation.  Recent Labs: 09/19/2016: B Natriuretic Peptide 14.1 12/05/2016: Hemoglobin 12.9; Magnesium 1.7; Platelets 192 05/28/2017: ALT 11; BUN 11; Creatinine, Ser 0.82; Potassium 3.6; Sodium 140    Lipid Panel    Component Value Date/Time   CHOL 152 01/31/2013 0500   TRIG 92 01/31/2013 0500   HDL 44 01/31/2013 0500   CHOLHDL 3.5 01/31/2013 0500   VLDL 18 01/31/2013 0500   LDLCALC 90 01/31/2013 0500     Wt Readings from Last 3 Encounters:  09/16/17 157 lb 6.4 oz (71.4 kg)  06/02/17 156 lb 12.8 oz (71.1 kg)  05/28/17 155 lb 12 oz (70.6 kg)     Other studies Reviewed: Additional studies/ records that were reviewed today include: office notes, hospital records and testing.  ASSESSMENT AND PLAN:  1.  Chest pain, mod CAD risk: She was having Chest pain similar to this in 2016 when her Myoview was normal. She had moderate coronary artery disease by cath in 2014. Her symptoms have  typical and atypical qualities. I discussed the situation with her and feel that a stress test is the best option right now. She understands that if this is abnormal, cardiac catheterization is indicated. We will schedule the stress test at her convenience. I will increase the Imdur to 60 mg a.m. and 30 mg p.m.  2. Chronic diastolic CHF: She has no significant volume overload by exam. Her weight is only up 1 pound in 3 months. She does pretty well with a baseline Lasix dose of 40 mg a day, but has to take an extra dose a couple of times a week for edema. I feel this is a good way to handle this. Her renal function and electrolytes were within normal limits when they were checked last in June. [name] has labs ordered for the future.   She is encouraged to continue the daily weights, low sodium diet and daily Lasix with a when necessary dose as needed. Call us for problems or concerns.  3. Hypertension: Her blood pressure control is adequate on current medications.   Current medicines are reviewed at length with the patient today.  The patient does not have concerns regarding medicines.  The following changes have been made:  Increase Imdur  Labs/ tests ordered today include:   Orders Placed This Encounter  Procedures  . Myocardial Perfusion Imaging  . EKG 12-Lead     Disposition:   FU with Dr. Stanford Breed  Signed, Bettyann Birchler,  [address]  09/16/2017 2:11 PM    [facility] Phone: [phone]; Fax: [phone]  This note was written with the assistance of speech recognition software. Please excuse any transcriptional errors.

## 2017-09-17 ENCOUNTER — Encounter: Payer: Self-pay | Admitting: Registered Nurse

## 2017-09-17 ENCOUNTER — Encounter: Payer: Medicare Other | Attending: Physical Medicine & Rehabilitation | Admitting: Registered Nurse

## 2017-09-17 VITALS — BP 111/76 | HR 81

## 2017-09-17 DIAGNOSIS — I209 Angina pectoris, unspecified: Secondary | ICD-10-CM | POA: Diagnosis not present

## 2017-09-17 DIAGNOSIS — G894 Chronic pain syndrome: Secondary | ICD-10-CM

## 2017-09-17 DIAGNOSIS — Z79899 Other long term (current) drug therapy: Secondary | ICD-10-CM | POA: Insufficient documentation

## 2017-09-17 DIAGNOSIS — F329 Major depressive disorder, single episode, unspecified: Secondary | ICD-10-CM

## 2017-09-17 DIAGNOSIS — M5416 Radiculopathy, lumbar region: Secondary | ICD-10-CM

## 2017-09-17 DIAGNOSIS — M1711 Unilateral primary osteoarthritis, right knee: Secondary | ICD-10-CM | POA: Diagnosis not present

## 2017-09-17 DIAGNOSIS — M1712 Unilateral primary osteoarthritis, left knee: Secondary | ICD-10-CM

## 2017-09-17 DIAGNOSIS — Z5181 Encounter for therapeutic drug level monitoring: Secondary | ICD-10-CM | POA: Diagnosis not present

## 2017-09-17 DIAGNOSIS — F411 Generalized anxiety disorder: Secondary | ICD-10-CM

## 2017-09-17 DIAGNOSIS — R2689 Other abnormalities of gait and mobility: Secondary | ICD-10-CM | POA: Insufficient documentation

## 2017-09-17 DIAGNOSIS — M797 Fibromyalgia: Secondary | ICD-10-CM | POA: Diagnosis not present

## 2017-09-17 MED ORDER — OXYCODONE-ACETAMINOPHEN 10-325 MG PO TABS: 1.0000 | ORAL_TABLET | Freq: Four times a day (QID) | ORAL | 0 refills | Status: DC | PRN

## 2017-09-17 NOTE — Progress Notes (Signed)
Subjective:    Patient ID: Linda [name], female    DOB: [birth year], 71 y.o.   MRN: [id number]  HPI: Linda [name] is a 71year old female who returns for follow up appointmentfor chronic pain and medication refill. She states her pain is located in herupper- lower back radiating into her bilateral lower extremities posteriorly and bilateral knee pain.She rates her pain 7. Her current exercise regime is walking.   Last UDS was on 06/03/2017, it was consistent.  Pain Inventory Average Pain 8 Pain Right Now 7 My pain is intermittent, sharp, stabbing and aching  In the last 24 hours, has pain interfered with the following? General activity 10 Relation with others 10 Enjoyment of life 10 What TIME of day is your pain at its worst? morning daytime  Sleep (in general) Fair  Pain is worse with: walking, bending, standing and some activites Pain improves with: rest and medication Relief from Meds: not answered  Mobility walk with assistance use a walker how many minutes can you walk? 5-10 ability to climb steps?  no do you drive?  yes  Function retired I need assistance with the following:  dressing, meal prep, household duties and shopping  Neuro/Psych bladder control problems weakness trouble walking spasms anxiety  Prior Studies Any changes since last visit?  no  Physicians involved in your care Any changes since last visit?  no   Family History  Problem Relation Age of Onset  . Arthritis Mother   . Kidney disease Mother   . Heart disease Father   . Stroke Father   . Hypertension Father   . Diabetes Father   . Heart attack Father   . Heart attack Paternal Grandmother    Social History   Social History  . Marital status: Widowed    Spouse name: N/A  . Number of children: 2  . Years of education: N/A   Occupational History  . Retired    Social History Main Topics  . Smoking status: Never Smoker  . Smokeless tobacco: Never Used   Comment: never used tobacco  . Alcohol use No  . Drug use: No  . Sexual activity: Not Currently   Other Topics Concern  . Not on file   Social History Narrative   Widowed, 2 sons.  Relocated to [place] 09/2012 to be closer to her son who has MS.   Husband d 2015--mesothelioma.   Occupation: former Pharmacist, hospital.   Education: masters degree level.   No T/A/Ds.   Lives in [place], independent living.   Past Surgical History:  Procedure Laterality Date  . APPENDECTOMY  1960  . AXILLARY SURGERY Left 1978   Multiple "lump" in armpit per pt  . CARDIAC CATHETERIZATION  01/2013   nonobstructive CAD, EF 55-60%  . CARDIOVASCULAR STRESS TEST  02/22/15   Low risk myocard perf imaging; wall motion normal, normal EF  . COCCYX REMOVAL  1972  . DEXA  06/05/2017   T-score -3.1  . DILATION AND CURETTAGE OF UTERUS  ? 1970's  . ESOPHAGOGASTRODUODENOSCOPY N/A 07/19/2014   Gastritis found + in the setting of supratherapeutic INR, +plavix, + meloxicam.  . EYE SURGERY Left 2012-2013   "injections for ~ 1 yr; don't really know what for" (07/12/2013)  . HEEL SPUR SURGERY Left 2008  . KNEE SURGERY  2005  . LEFT HEART CATHETERIZATION WITH CORONARY ANGIOGRAM N/A 01/30/2013   Procedure: LEFT HEART CATHETERIZATION WITH CORONARY ANGIOGRAM;  Surgeon: Clent Demark, MD;  Location: [facility]  CATH LAB;  Service: Cardiovascular;  Laterality: N/A;  . PLANTAR FASCIA RELEASE Left 2008  . SPIROMETRY  04/25/14   In hosp for acute asthma/COPD flare: mixed obstructive and restrictive lung disease. The FEV1 is severely reduced at 45% predicted.  FEV1 signif decreased compared to prior spirometry 07/23/13.  . TENDON RELEASE  1996   Right forearm and hand  . TOTAL ABDOMINAL HYSTERECTOMY  1974  . TRANSTHORACIC ECHOCARDIOGRAM  01/2013; 04/2014;08/2015   2014--NORMAL.  2015--focal basal septal hypertrophy, EF 55-60%, grade I diast dysfxn, mild LAE.  08/2015 EF 55-60%, nl LV syst fxn, grade I DD, valves wnl   Past Medical History:  Diagnosis  Date  . Acute upper GI bleed 06/2014   while pt taking coumadin, plavix, and meloxicam---despite being told not to take coumadin.  . Anginal pain ([place])    Nonobstructive CAD 2014; however, her cardiologist put her on a statin for this and NOT for hyperlipidemia per pt report.  . Anxiety    panic attacks  . Asthma    w/ asbestososis   . BPPV (benign paroxysmal positional vertigo) 12/16/2012  . Chronic diastolic CHF (congestive heart failure) (H[facility])    dry wt as of 11/06/16 is 168 lbs.  . Chronic lower back pain   . COPD (chronic obstructive pulmonary disease) ([place])   . DDD (degenerative disc disease)    lumbar and cervical.   . Depression   . Diverticular disease   . Fibromyalgia    Patient states dx was around her late 71s but she had sx's for years prior to this.  . H/O hiatal hernia   . Hay fever   . History of pneumonia    hospitalized 12/2011, 02/2013, and 07/2013 [facility]) for this  . HTN (hypertension)   . Idiopathic angio-edema-urticaria 72014   Angioedema component was very minimal  . Insomnia   . Iron deficiency anemia    Hematologist in [place], [place] did extensive w/u; no cause found; failed oral supplement;; gets fairly regular (q6m or so) IV iron infusions (Venofer -iron sucrose- 200mg  with procrit.  "for 14 yr I've been getting blood work q month & getting infusions prn" (07/12/2013).  Dr. Marin Olp locally, iron infusions done, EPO deficiency dx'd  . Migraine syndrome    "not as often anymore; used to be ~ q wk" (07/12/2013)  . Mixed incontinence urge and stress   . Nephrolithiasis    "passed all on my own or they are still in there" (07/12/2013)  . OSA on CPAP    prior to move to [place]--had another sleep study 11/71/2016 w/pulm Dr. Camillo Flaming.  . Osteoarthritis    "severe; progressing fast" (07/12/2013); multiple joints-not surgical candidate for TKR (03/2015)  . Pernicious anemia 08/24/2014  . Pleural plaque with presence of asbestos 07/22/2013  . Pulmonary embolism ([place]) 07/2013   Dx at  [facility] with very small peripheral upper lobe pe 07/2013: pt took coumadin for about 8-9 mo  . Pyelonephritis    "several times over the last 30 yr" (07/12/2013)  . RBBB (right bundle branch block)   . Recurrent UTI    hx of hospitalization for pyelonephritis; started abx prophylaxis 06/2015  . Syncope    Hypotensive; ED visit--Dr. Terrence Dupont did Cath--nonobstructive CAD, EF 55-60%.  In retrospect, suspect pt rx med misuse/polypharmacy  . Tension headache, chronic    BP 111/76   Pulse 81   SpO2 94%   Opioid Risk Score:  1 Fall Risk Score:  `1  Depression screen [facility] 2/9  Depression screen [facility] 2/9 09/17/2017 08/18/2017 07/21/2017 05/06/2017 08/01/2016 06/12/2016 04/19/2016  Decreased Interest 1 0 0 0 3 1 0  Down, Depressed, Hopeless 1 3 3 3 3  0 0  PHQ - 2 Score 2 3 3 3 6 1  0  Altered sleeping - - - - - - -  Tired, decreased energy - - - - - - -  Change in appetite - - - - - - -  Feeling bad or failure about yourself  - - - - - - -  Trouble concentrating - - - - - - -  Moving slowly or fidgety/restless - - - - - - -  Suicidal thoughts - - - - - - -  PHQ-9 Score - - - - - - -  Some recent data might be hidden    Review of Systems  Constitutional: Positive for diaphoresis.  HENT: Negative.   Eyes: Negative.   Respiratory: Positive for cough and shortness of breath.   Cardiovascular: Negative.   Gastrointestinal: Positive for constipation and nausea.  Endocrine: Negative.   Genitourinary:       Bladder control  Musculoskeletal: Positive for gait problem.  Skin: Negative.   Allergic/Immunologic: Negative.   Neurological: Positive for weakness.  Hematological: Negative.   Psychiatric/Behavioral: The patient is nervous/anxious.   All other systems reviewed and are negative.      Objective:   Physical Exam  Constitutional: She is oriented to person, place, and time. She appears well-developed and well-nourished.  HENT:  Head: Normocephalic and atraumatic.  Neck: Normal range of  motion. Neck supple.  Cardiovascular: Normal rate and regular rhythm.   Pulmonary/Chest: Effort normal and breath sounds normal.  Musculoskeletal:  Normal Muscle Bulk and Muscle Testing Reveals: Upper Extremities: Full ROM and Muscle Strength 5/5 Bilateral AC Joint Tenderness Thoracic Paraspinal Tenderness: T-1-T-6 Lumbar Paraspinal Tenderness: L-3-L-5 Lower Extremities: Full ROM and Muscle Strength 5/5 Bilateral Lower Extremities Flexion Produces Pain into Bilateral Patella's Arises from Table Slowly using walker for support. Narrow Based Gait  Neurological: She is alert and oriented to person, place, and time.  Skin: Skin is warm and dry.  Psychiatric: She has a normal mood and affect.  Nursing note and vitals reviewed.         Assessment & Plan:  1. Functional deficits secondary to Gait disorder:Continue with HEP.09/17/2017 2. Chronic Back pain/ Lumbar Radiculitis/fibromyalgia /R>L Knee OA Pain Management: 09/17/2017 Refilled: Oxycodone 10/325mg one tablet every 6 hours #100 and Voltaren Gel We will continue the opioid monitoring program, this consists of regular clinic visits, examinations, urine drug screen, pill counts as well as use of [place] Controlled Substance reporting System. 3. Depression with anxiety/Grief reaction/Mood: 09/17/2017 Continue Xanax,Trazodone and Cymbalta .  4. [name] with asthma: Oxygen dependent. Albuterol prn.  On Continuous Oxygen Therapy:Pulmonology Following. 09/17/2017 5. Bilateral Osteoarthritis of Bilateral Knees: Dr. Juanda Bond Following. 09/17/2017 6. Fibromyalgia: Continue Home exercise regime. 09/17/2017  20 minutes of face to face patient care time was spent during this visit. All questions were encouraged and answered. F/U in 1 month

## 2017-10-02 ENCOUNTER — Telehealth (HOSPITAL_COMMUNITY): Payer: Self-pay

## 2017-10-02 NOTE — Telephone Encounter (Signed)
Encounter complete. 

## 2017-10-07 ENCOUNTER — Encounter: Payer: Self-pay | Admitting: Family Medicine

## 2017-10-08 ENCOUNTER — Ambulatory Visit (HOSPITAL_COMMUNITY)
Admission: RE | Admit: 2017-10-08 | Discharge: 2017-10-08 | Disposition: A | Discharge status: Home / Self Care | Payer: Medicare Other | Source: Ambulatory Visit | Attending: Cardiovascular Disease | Admitting: Cardiovascular Disease

## 2017-10-08 DIAGNOSIS — J9610 Chronic respiratory failure, unspecified whether with hypoxia or hypercapnia: Secondary | ICD-10-CM | POA: Diagnosis not present

## 2017-10-08 DIAGNOSIS — I5032 Chronic diastolic (congestive) heart failure: Secondary | ICD-10-CM | POA: Insufficient documentation

## 2017-10-08 DIAGNOSIS — G4733 Obstructive sleep apnea (adult) (pediatric): Secondary | ICD-10-CM | POA: Insufficient documentation

## 2017-10-08 DIAGNOSIS — R079 Chest pain, unspecified: Secondary | ICD-10-CM | POA: Insufficient documentation

## 2017-10-08 DIAGNOSIS — I2510 Atherosclerotic heart disease of native coronary artery without angina pectoris: Secondary | ICD-10-CM | POA: Diagnosis not present

## 2017-10-08 DIAGNOSIS — I110 Hypertensive heart disease with heart failure: Secondary | ICD-10-CM | POA: Diagnosis not present

## 2017-10-08 DIAGNOSIS — I4510 Unspecified right bundle-branch block: Secondary | ICD-10-CM | POA: Insufficient documentation

## 2017-10-08 DIAGNOSIS — R9439 Abnormal result of other cardiovascular function study: Secondary | ICD-10-CM | POA: Insufficient documentation

## 2017-10-08 DIAGNOSIS — Z8249 Family history of ischemic heart disease and other diseases of the circulatory system: Secondary | ICD-10-CM | POA: Diagnosis not present

## 2017-10-08 DIAGNOSIS — J449 Chronic obstructive pulmonary disease, unspecified: Secondary | ICD-10-CM | POA: Insufficient documentation

## 2017-10-08 LAB — MYOCARDIAL PERFUSION IMAGING
CHL CUP NUCLEAR SRS: 5
CHL CUP NUCLEAR SSS: 10
LV dias vol: 9 mL (ref 46–106)
LVSYSVOL: 49 mL
Peak HR: 90 {beats}/min
Rest HR: 65 {beats}/min
SDS: 5
TID: 1.54

## 2017-10-08 MED ORDER — REGADENOSON 0.4 MG/5ML IV SOLN
0.4000 mg | Freq: Once | INTRAVENOUS | Status: AC
  Administered 2017-10-08: 0.4 mg via INTRAVENOUS

## 2017-10-08 MED ORDER — TECHNETIUM TC 99M TETROFOSMIN IV KIT
10.1000 | PACK | Freq: Once | INTRAVENOUS | Status: AC | PRN
  Administered 2017-10-08: 10.1 via INTRAVENOUS
  Filled 2017-10-08: qty 11

## 2017-10-08 MED ORDER — TECHNETIUM TC 99M TETROFOSMIN IV KIT
31.5000 | PACK | Freq: Once | INTRAVENOUS | Status: AC | PRN
  Administered 2017-10-08: 31.5 via INTRAVENOUS
  Filled 2017-10-08: qty 32

## 2017-10-08 MED ORDER — AMINOPHYLLINE 25 MG/ML IV SOLN
75.0000 mg | Freq: Once | INTRAVENOUS | Status: AC
  Administered 2017-10-08: 75 mg via INTRAVENOUS

## 2017-10-14 ENCOUNTER — Other Ambulatory Visit: Payer: Self-pay | Admitting: Family Medicine

## 2017-10-14 NOTE — Telephone Encounter (Signed)
[place]  RF request for alprazolam LOV: 05/28/17 Next ov: 11/27/17 Last written: 04/21/17 #90 w/ 5RF  Please advise. Thanks.

## 2017-10-15 ENCOUNTER — Ambulatory Visit: Payer: Medicare Other | Admitting: Registered Nurse

## 2017-10-15 NOTE — Telephone Encounter (Signed)
Rx faxed

## 2017-10-16 ENCOUNTER — Ambulatory Visit: Payer: Medicare Other | Admitting: Registered Nurse

## 2017-10-20 ENCOUNTER — Emergency Department (HOSPITAL_COMMUNITY): Payer: Medicare Other

## 2017-10-20 ENCOUNTER — Encounter: Payer: Self-pay | Admitting: Registered Nurse

## 2017-10-20 ENCOUNTER — Encounter: Payer: Medicare Other | Attending: Physical Medicine & Rehabilitation | Admitting: Registered Nurse

## 2017-10-20 ENCOUNTER — Inpatient Hospital Stay (HOSPITAL_COMMUNITY)
Admission: EM | Admit: 2017-10-20 | Discharge: 2017-10-23 | DRG: 071 | Disposition: A | Discharge status: Home Health | Payer: Medicare Other | Attending: Internal Medicine | Admitting: Internal Medicine

## 2017-10-20 VITALS — BP 132/80 | HR 81

## 2017-10-20 DIAGNOSIS — Z7982 Long term (current) use of aspirin: Secondary | ICD-10-CM

## 2017-10-20 DIAGNOSIS — Z9981 Dependence on supplemental oxygen: Secondary | ICD-10-CM

## 2017-10-20 DIAGNOSIS — Z8261 Family history of arthritis: Secondary | ICD-10-CM

## 2017-10-20 DIAGNOSIS — G9349 Other encephalopathy: Principal | ICD-10-CM | POA: Diagnosis present

## 2017-10-20 DIAGNOSIS — I209 Angina pectoris, unspecified: Secondary | ICD-10-CM | POA: Diagnosis not present

## 2017-10-20 DIAGNOSIS — E876 Hypokalemia: Secondary | ICD-10-CM | POA: Diagnosis present

## 2017-10-20 DIAGNOSIS — M5136 Other intervertebral disc degeneration, lumbar region: Secondary | ICD-10-CM | POA: Diagnosis present

## 2017-10-20 DIAGNOSIS — J61 Pneumoconiosis due to asbestos and other mineral fibers: Secondary | ICD-10-CM | POA: Diagnosis present

## 2017-10-20 DIAGNOSIS — R404 Transient alteration of awareness: Secondary | ICD-10-CM | POA: Diagnosis not present

## 2017-10-20 DIAGNOSIS — M1711 Unilateral primary osteoarthritis, right knee: Secondary | ICD-10-CM | POA: Insufficient documentation

## 2017-10-20 DIAGNOSIS — I110 Hypertensive heart disease with heart failure: Secondary | ICD-10-CM | POA: Diagnosis present

## 2017-10-20 DIAGNOSIS — Z79899 Other long term (current) drug therapy: Secondary | ICD-10-CM | POA: Insufficient documentation

## 2017-10-20 DIAGNOSIS — Z880 Allergy status to penicillin: Secondary | ICD-10-CM

## 2017-10-20 DIAGNOSIS — R0602 Shortness of breath: Secondary | ICD-10-CM | POA: Diagnosis not present

## 2017-10-20 DIAGNOSIS — M79604 Pain in right leg: Secondary | ICD-10-CM

## 2017-10-20 DIAGNOSIS — N390 Urinary tract infection, site not specified: Secondary | ICD-10-CM | POA: Diagnosis not present

## 2017-10-20 DIAGNOSIS — R55 Syncope and collapse: Secondary | ICD-10-CM | POA: Diagnosis not present

## 2017-10-20 DIAGNOSIS — S0990XA Unspecified injury of head, initial encounter: Secondary | ICD-10-CM | POA: Diagnosis not present

## 2017-10-20 DIAGNOSIS — G8929 Other chronic pain: Secondary | ICD-10-CM

## 2017-10-20 DIAGNOSIS — F329 Major depressive disorder, single episode, unspecified: Secondary | ICD-10-CM | POA: Diagnosis not present

## 2017-10-20 DIAGNOSIS — I5032 Chronic diastolic (congestive) heart failure: Secondary | ICD-10-CM | POA: Diagnosis present

## 2017-10-20 DIAGNOSIS — R4182 Altered mental status, unspecified: Secondary | ICD-10-CM | POA: Diagnosis not present

## 2017-10-20 DIAGNOSIS — Z7983 Long term (current) use of bisphosphonates: Secondary | ICD-10-CM

## 2017-10-20 DIAGNOSIS — M797 Fibromyalgia: Secondary | ICD-10-CM | POA: Diagnosis not present

## 2017-10-20 DIAGNOSIS — J449 Chronic obstructive pulmonary disease, unspecified: Secondary | ICD-10-CM | POA: Diagnosis present

## 2017-10-20 DIAGNOSIS — W19XXXA Unspecified fall, initial encounter: Secondary | ICD-10-CM | POA: Diagnosis present

## 2017-10-20 DIAGNOSIS — S199XXA Unspecified injury of neck, initial encounter: Secondary | ICD-10-CM | POA: Diagnosis not present

## 2017-10-20 DIAGNOSIS — R51 Headache: Secondary | ICD-10-CM | POA: Diagnosis not present

## 2017-10-20 DIAGNOSIS — M1712 Unilateral primary osteoarthritis, left knee: Secondary | ICD-10-CM

## 2017-10-20 DIAGNOSIS — F411 Generalized anxiety disorder: Secondary | ICD-10-CM | POA: Diagnosis not present

## 2017-10-20 DIAGNOSIS — M25461 Effusion, right knee: Secondary | ICD-10-CM | POA: Diagnosis not present

## 2017-10-20 DIAGNOSIS — R2689 Other abnormalities of gait and mobility: Secondary | ICD-10-CM | POA: Insufficient documentation

## 2017-10-20 DIAGNOSIS — I2510 Atherosclerotic heart disease of native coronary artery without angina pectoris: Secondary | ICD-10-CM | POA: Diagnosis present

## 2017-10-20 DIAGNOSIS — R52 Pain, unspecified: Secondary | ICD-10-CM

## 2017-10-20 DIAGNOSIS — M545 Low back pain, unspecified: Secondary | ICD-10-CM

## 2017-10-20 DIAGNOSIS — M542 Cervicalgia: Secondary | ICD-10-CM | POA: Diagnosis not present

## 2017-10-20 DIAGNOSIS — M25511 Pain in right shoulder: Secondary | ICD-10-CM | POA: Diagnosis not present

## 2017-10-20 DIAGNOSIS — R296 Repeated falls: Secondary | ICD-10-CM | POA: Diagnosis present

## 2017-10-20 DIAGNOSIS — M549 Dorsalgia, unspecified: Secondary | ICD-10-CM

## 2017-10-20 DIAGNOSIS — R531 Weakness: Secondary | ICD-10-CM | POA: Diagnosis not present

## 2017-10-20 DIAGNOSIS — I4510 Unspecified right bundle-branch block: Secondary | ICD-10-CM | POA: Diagnosis present

## 2017-10-20 DIAGNOSIS — Z5181 Encounter for therapeutic drug level monitoring: Secondary | ICD-10-CM | POA: Insufficient documentation

## 2017-10-20 DIAGNOSIS — Z8249 Family history of ischemic heart disease and other diseases of the circulatory system: Secondary | ICD-10-CM

## 2017-10-20 DIAGNOSIS — B9689 Other specified bacterial agents as the cause of diseases classified elsewhere: Secondary | ICD-10-CM | POA: Diagnosis present

## 2017-10-20 DIAGNOSIS — F419 Anxiety disorder, unspecified: Secondary | ICD-10-CM | POA: Diagnosis present

## 2017-10-20 MED ORDER — OXYCODONE-ACETAMINOPHEN 10-325 MG PO TABS: 1.0000 | ORAL_TABLET | Freq: Four times a day (QID) | ORAL | 0 refills | Status: DC | PRN

## 2017-10-20 NOTE — ED Notes (Signed)
Bed: [facility] Expected date:  Expected time:  Means of arrival:  Comments: EMS 71 yo female from SNF-fall-no LOC-no injury

## 2017-10-20 NOTE — ED Triage Notes (Signed)
Pt coming from [facility] with complaint of a fall. PT found on bathroom floor. Pt states she got weak when she tried to stand up from the toilet and fell forward. Pt denies hitting her head. Pt has history of falls in the past. Pt has chronic pain in the right knee. Pt A&O x4. Not on any blood thinners.  140/76 HR 84 Resp 16 93% RA  [id number]

## 2017-10-20 NOTE — ED Provider Notes (Signed)
[facility] DEPT Provider Note   CSN: [id number] Arrival date & time: 10/20/17  2309     History   Chief Complaint Chief Complaint  Patient presents with  . Fall    HPI Linda [name] is a 71 y.o. female with a hx of BPPV, anxiety and depression,diastolic CHF,asbestosis, COPD on O2 3 lpm, HTN, moderate CAD by cath 2014 -med rx, fibromyalgia w/ chronic pain issues on oxycodone,  RBBB and inf Q waves on ECG, echo 05/2016 w/ nl EF, grade 1 diastolic dysfunction presents to the Emergency Department after reports of 2 falls today.  Patient reports that she had a syncopal episode this morning and fell, hitting her head.  She reports this evening she lost her balance transferring from her wheelchair to her toilet and fell hitting her right shoulder.  Patient denies hitting her hip or having difficulty moving her legs.  She reports compliance with her medications including her Lasix.  She denies chest pain or shortness of breath today.  No abdominal pain.  Pt does report nausea with several bouts of emesis tonight.  She also reports mild SOB and chest heaviness.  She denies fevers or chills, rash, diarrhea.  With no known aggravating or alleviating factors.  Patient currently reports right shoulder pain and right knee pain.  Records show that she has a history of chronic right knee pain.  Pt record also with recurrent syncope suspected to be 2/2 polypharmacy.      Per EMS, pt was transported tonight due to being found on the bathroom floor after her 2nd fall.     The history is provided by the patient and medical records. No language interpreter was used.    Past Medical History:  Diagnosis Date  . Acute upper GI bleed 06/2014   while pt taking coumadin, plavix, and meloxicam---despite being told not to take coumadin.  . Anginal pain ([place])    Nonobstructive CAD 2014; however, her cardiologist put her on a statin for this and NOT for hyperlipidemia per pt  report.  Atyp CP 08/2017 at card f/u, plan for myoc perf imaging.  . Anxiety    panic attacks  . Asthma    w/ asbestososis   . BPPV (benign paroxysmal positional vertigo) 12/16/2012  . Chronic diastolic CHF (congestive heart failure) (HCC)    dry wt as of 11/06/16 is 168 lbs.  . Chronic lower back pain   . COPD (chronic obstructive pulmonary disease) ([place])   . DDD (degenerative disc disease)    lumbar and cervical.   . Depression   . Diverticular disease   . Fibromyalgia    Patient states dx was around her late 69s but she had sx's for years prior to this.  . H/O hiatal hernia   . Hay fever   . History of pneumonia    hospitalized 12/2011, 02/2013, and 07/2013 [facility]) for this  . HTN (hypertension)   . Idiopathic angio-edema-urticaria 72014   Angioedema component was very minimal  . Insomnia   . Iron deficiency anemia    Hematologist in [place], [place] did extensive w/u; no cause found; failed oral supplement;; gets fairly regular (q54m or so) IV iron infusions (Venofer -iron sucrose- 200mg  with procrit.  "for 14 yr I've been getting blood work q month & getting infusions prn" (07/12/2013).  Dr. Marin Olp locally, iron infusions done, EPO deficiency dx'd  . Migraine syndrome    "not as often anymore; used to be ~ q wk" (07/12/2013)  .  Mixed incontinence urge and stress   . Nephrolithiasis    "passed all on my own or they are still in there" (07/12/2013)  . OSA on CPAP    prior to move to [place]--had another sleep study 10/2015 w/pulm Dr. Camillo Flaming.  . Osteoarthritis    "severe; progressing fast" (07/12/2013); multiple joints-not surgical candidate for TKR (03/2015)  . Pernicious anemia 08/24/2014  . Pleural plaque with presence of asbestos 07/22/2013  . Pulmonary embolism ([place]) 07/2013   Dx at [facility] with very small peripheral upper lobe pe 07/2013: pt took coumadin for about 8-9 mo  . Pyelonephritis    "several times over the last 30 yr" (07/12/2013)  . RBBB (right bundle branch block)   . Recurrent UTI      hx of hospitalization for pyelonephritis; started abx prophylaxis 06/2015  . Syncope    Hypotensive; ED visit--Dr. Terrence Dupont did Cath--nonobstructive CAD, EF 55-60%.  In retrospect, suspect pt rx med misuse/polypharmacy  . Tension headache, chronic     Patient Active Problem List   Diagnosis Date Noted  . Lumbar radiculitis 02/10/2017  . Dehydration 12/04/2016  . Low serum erythropoietin level 10/17/2016  . RBBB 09/23/2016  . Primary osteoarthritis of left knee 06/19/2016  . Debilitated patient 06/06/2016  . Acute on chronic diastolic CHF (congestive heart failure), NYHA class 3 ([place]) 05/30/2016  . SOB (shortness of breath)   . [name] ([place])   . Rotator cuff syndrome of right shoulder 01/10/2016  . UTI (urinary tract infection) 01/01/2016  . Encephalopathy, metabolic 63/12/6008  . CAP (community acquired pneumonia) 01/01/2016  . Fall at home 01/01/2016  . Rhabdomyolysis 01/01/2016  . Diastolic heart failure ([place]) 10/16/2015  . CAD-minor 2014 08/16/2015  . Chest pain with moderate risk for cardiac etiology 08/16/2015  . Narrowing of intervertebral disc space 07/17/2015  . Bilateral lower leg pain 01/24/2015  . 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 Chronic pain syndrome 11/21/2014  . Anxiety and depression 11/21/2014  . Abnormal grief reaction 11/21/2014  . Severe persistent asthma 11/21/2014  . Hypokalemia 11/21/2014  . Hyperlipidemia 11/21/2014  . Osteoarthritis of right knee 10/18/2014  . Pernicious anemia 08/24/2014  . Generalized anxiety disorder--with occasional panic attacks.  08/05/2014  . Recurrent major depression-severe ([place]) 08/05/2014  . Multifactorial gait disorder 07/26/2014  . Chronic respiratory failure ([place]) 07/23/2014  . Epistaxis 07/18/2014  . Acute GI bleeding 07/17/2014  . Anemia associated with acute blood loss 07/17/2014  . Syncope 07/17/2014  . History of pulmonary embolism 07/17/2014  . GI bleed 07/17/2014  . Arthritis 05/11/2014  . DDD  (degenerative disc disease) 05/11/2014  . Fibrositis 05/11/2014  . Amianthosis ([place]) 05/11/2014  . Grief 04/28/2014  . OSA (obstructive sleep apnea) 04/24/2014  . Chronic diastolic CHF, NYHA class 1 04/24/2014  . Acute pulmonary embolism ([place]) 08/13/2013  . Pleural plaque with presence of asbestos 07/22/2013  . Polypharmacy 04/26/2013  . Fibromyalgia syndrome 03/01/2013  . Insomnia 11/12/2012  . HTN (hypertension), benign 10/25/2012    Past Surgical History:  Procedure Laterality Date  . APPENDECTOMY  1960  . AXILLARY SURGERY Left 1978   Multiple "lump" in armpit per pt  . CARDIAC CATHETERIZATION  01/2013   nonobstructive CAD, EF 55-60%  . CARDIOVASCULAR STRESS TEST  02/22/15   Low risk myocard perf imaging; wall motion normal, normal EF  . COCCYX REMOVAL  1972  . DEXA  06/05/2017   T-score -3.1  . DILATION AND CURETTAGE OF UTERUS  ? 1970's  . ESOPHAGOGASTRODUODENOSCOPY N/A  07/19/2014   Gastritis found + in the setting of supratherapeutic INR, +plavix, + meloxicam.  . EYE SURGERY Left 2012-2013   "injections for ~ 1 yr; don't really know what for" (07/12/2013)  . HEEL SPUR SURGERY Left 2008  . KNEE SURGERY  2005  . LEFT HEART CATHETERIZATION WITH CORONARY ANGIOGRAM N/A 01/30/2013   Procedure: LEFT HEART CATHETERIZATION WITH CORONARY ANGIOGRAM;  Surgeon: Clent Demark, MD;  Location: [facility] CATH LAB;  Service: Cardiovascular;  Laterality: N/A;  . PLANTAR FASCIA RELEASE Left 2008  . SPIROMETRY  04/25/14   In hosp for acute asthma/COPD flare: mixed obstructive and restrictive lung disease. The FEV1 is severely reduced at 45% predicted.  FEV1 signif decreased compared to prior spirometry 07/23/13.  . TENDON RELEASE  1996   Right forearm and hand  . TOTAL ABDOMINAL HYSTERECTOMY  1974  . TRANSTHORACIC ECHOCARDIOGRAM  01/2013; 04/2014;08/2015   2014--NORMAL.  2015--focal basal septal hypertrophy, EF 55-60%, grade I diast dysfxn, mild LAE.  08/2015 EF 55-60%, nl LV syst fxn, grade I DD, valves wnl      OB History    No data available       Home Medications    Prior to Admission medications   Medication Sig Start Date End Date Taking? Authorizing Provider  albuterol (PROVENTIL HFA;VENTOLIN HFA) 108 (90 BASE) MCG/ACT inhaler Inhale 2 puffs into the lungs every 6 (six) hours as needed for wheezing or shortness of breath. 09/15/14   McGowen, Adrian Blackwater, MD  alendronate (FOSAMAX) 70 MG tablet Take 1 tablet (70 mg total) by mouth every 7 (seven) days. Take with a full glass of water on an empty stomach, first thing in the morning and remain up right for 30 minutes after taking. 06/05/17   McGowen, Adrian Blackwater, MD  ALPRAZolam Duanne Moron) 1 MG tablet TAKE 1 TABLET THREE TIMES DAILY AS NEEDED FOR ANXIETY. 10/15/17   McGowen, Adrian Blackwater, MD  ARIPiprazole (ABILIFY) 10 MG tablet TAKE 1 TABLET ONCE DAILY. 10/17/16   McGowenAdrian Blackwater, MD  aspirin 81 MG tablet Take 81 mg by mouth at bedtime.    [provider]  diclofenac sodium (VOLTAREN) 1 % GEL Apply 2 g topically 3 (three) times daily as needed (pain). 11/19/16   Bayard Hugger, NP  DULoxetine (CYMBALTA) 30 MG capsule TAKE 1 CAPSULE ONCE DAILY ALONG WITH 60MG  CAPSULE FOR TOTAL 90MG  DAILY. 10/14/17   McGowen, Adrian Blackwater, MD  DULoxetine (CYMBALTA) 60 MG capsule TAKE 1 CAPSULE A DAY TO BE COMBINED WITH 30MG  CAPSULE 07/25/17   McGowen, Adrian Blackwater, MD  furosemide (LASIX) 40 MG tablet Take 1 tablet (40 mg total) by mouth daily. 12/06/16   Dhungel, Flonnie Overman, MD  isosorbide mononitrate (IMDUR) 30 MG 24 hr tablet Take 1 tablet (30 mg total) by mouth 2 (two) times daily. 60mg  in the am and 30mg  in the pm 09/16/17   Lelon Perla, MD  metoprolol succinate (TOPROL XL) 25 MG 24 hr tablet Take 1 tablet (25 mg total) by mouth 2 (two) times daily. 11/07/16   Barrett, Evelene Croon, PA-C  Multiple Vitamin (MULTIVITAMIN WITH MINERALS) TABS tablet Take 1 tablet by mouth daily.    [provider]  nitroGLYCERIN (NITROSTAT) 0.4 MG SL tablet Place 1 tablet (0.4 mg  total) under the tongue every 5 (five) minutes as needed for chest pain (x 3 doses). 06/02/17   Barrett, Evelene Croon, PA-C  oxyCODONE-acetaminophen (PERCOCET) 10-325 MG tablet Take 1 tablet by mouth every 6 (six) hours as needed for  pain. 10/20/17   Bayard Hugger, NP  OXYGEN Inhale 3 L into the lungs continuous.    [provider]  pantoprazole (PROTONIX) 40 MG tablet TAKE 1 TABLET DAILY. 02/26/17   McGowen, Adrian Blackwater, MD  polyethylene glycol (MIRALAX / GLYCOLAX) packet Take 17 g by mouth daily as needed (constipation). Mix in 8 oz liquid and drink    [provider]  potassium chloride SA (K-DUR,KLOR-CON) 20 MEQ tablet Take 20 mEq by mouth as directed. Take 2 tablets by mouth in the morning and tablet in the evening    [provider]  traZODone (DESYREL) 50 MG tablet TAKE 2-4 TABLETS AT BEDTIME 05/21/17   McGowen, Adrian Blackwater, MD  Vitamin D, Ergocalciferol, (DRISDOL) 50000 units CAPS capsule 1 tab po TWICE per week 05/29/17   McGowen, Adrian Blackwater, MD    Family History Family History  Problem Relation Age of Onset  . Arthritis Mother   . Kidney disease Mother   . Heart disease Father   . Stroke Father   . Hypertension Father   . Diabetes Father   . Heart attack Father   . Heart attack Paternal Grandmother     Social History Social History  Substance Use Topics  . Smoking status: Never Smoker  . Smokeless tobacco: Never Used     Comment: never used tobacco  . Alcohol use No     Allergies   Penicillins   Review of Systems Review of Systems  Constitutional: Negative for appetite change, diaphoresis, fatigue, fever and unexpected weight change.  HENT: Negative for mouth sores.   Eyes: Negative for visual disturbance.  Respiratory: Negative for cough, chest tightness, shortness of breath and wheezing.   Cardiovascular: Negative for chest pain.  Gastrointestinal: Negative for abdominal pain, constipation, diarrhea, nausea and vomiting.  Endocrine: Negative for  polydipsia, polyphagia and polyuria.  Genitourinary: Negative for dysuria, frequency, hematuria and urgency.  Musculoskeletal: Positive for arthralgias. Negative for back pain and neck stiffness.  Skin: Negative for rash.  Allergic/Immunologic: Negative for immunocompromised state.  Neurological: Positive for syncope. Negative for light-headedness and headaches.  Hematological: Does not bruise/bleed easily.  Psychiatric/Behavioral: Negative for sleep disturbance. The patient is not nervous/anxious.      Physical Exam Updated Vital Signs BP (!) 153/66   Pulse 79   Resp 16   Ht 5\' 3"  (1.6 m)   Wt 68.9 kg (152 lb)   SpO2 100%   BMI 26.93 kg/m   Physical Exam  Constitutional: She appears well-developed and well-nourished. She appears lethargic. No distress.  Awake, alert, nontoxic appearance Pt is sleepy, but is able to arouse and answer questions  HENT:  Head: Normocephalic and atraumatic.  Mouth/Throat: Oropharynx is clear and moist. No oropharyngeal exudate.  Eyes: Conjunctivae are normal. No scleral icterus.  Neck: Normal range of motion. Neck supple. No spinous process tenderness and no muscular tenderness present. No neck rigidity. Normal range of motion present.  No midline or paraspinal tenderness No step-off or deformity No nuchal rigidity  Cardiovascular: Normal rate, regular rhythm and intact distal pulses.   Pulses:      Radial pulses are 2+ on the right side, and 2+ on the left side.       Dorsalis pedis pulses are 2+ on the right side, and 2+ on the left side.  Pulmonary/Chest: Effort normal. No respiratory distress. She has no wheezes. She has rales in the right lower field and the left lower field.  Equal chest expansion  Abdominal: Soft.  Bowel sounds are normal. She exhibits no distension and no mass. There is no tenderness. There is no rebound and no guarding.  Musculoskeletal: She exhibits edema ( mild 1+, BLE).       Right shoulder: She exhibits tenderness  and pain. She exhibits normal range of motion.       Right knee: She exhibits decreased range of motion ( mild). She exhibits no swelling, no effusion, no ecchymosis, no deformity, no laceration and no erythema. Tenderness ( throughout) found.       Arms: FROM of the BUE including elbow, wrist and hand  Neurological: She appears lethargic.  Speech is clear and goal oriented Moves extremities without ataxia  Skin: Skin is warm and dry. She is not diaphoretic.  Psychiatric: She has a normal mood and affect.  Nursing note and vitals reviewed.    ED Treatments / Results  Labs (all labs ordered are listed, but only abnormal results are displayed) Labs Reviewed  CBC WITH DIFFERENTIAL/PLATELET - Abnormal; Notable for the following:       Result Value   WBC 11.2 (*)    Neutro Abs 8.7 (*)    All other components within normal limits  COMPREHENSIVE METABOLIC PANEL - Abnormal; Notable for the following:    Potassium 3.2 (*)    Chloride 100 (*)    Glucose, Bld 106 (*)    Calcium 8.8 (*)    AST 42 (*)    All other components within normal limits  URINALYSIS, ROUTINE W REFLEX MICROSCOPIC - Abnormal; Notable for the following:    APPearance HAZY (*)    Hgb urine dipstick SMALL (*)    Protein, ur 30 (*)    Nitrite POSITIVE (*)    Leukocytes, UA SMALL (*)    Bacteria, UA MANY (*)    Squamous Epithelial / LPF 0-5 (*)    All other components within normal limits  RAPID URINE DRUG SCREEN, HOSP PERFORMED - Abnormal; Notable for the following:    Opiates POSITIVE (*)    Benzodiazepines POSITIVE (*)    All other components within normal limits  URINE CULTURE  BRAIN NATRIURETIC PEPTIDE  LIPASE, BLOOD  CBG MONITORING, ED  I-STAT TROPONIN, ED    EKG  EKG Interpretation  Date/Time:  Tuesday October 21 2017 00:45:51 EDT Ventricular Rate:  80 PR Interval:    QRS Duration: 157 QT Interval:  438 QTC Calculation: 506 R Axis:     Text Interpretation:  Sinus rhythm Right bundle branch block  No significant change since last tracing Reconfirmed by Pryor Curia [phone]) on 10/21/2017 12:50:27 AM       Radiology Dg Chest 2 View  Result Date: 10/21/2017 CLINICAL DATA:  Shortness of breath. Fall out of bed at nursing home. EXAM: CHEST  2 VIEW COMPARISON:  Chest radiograph 12/04/2016.  Chest CT 01/01/2016 FINDINGS: The cardiomediastinal contours are unchanged with aortic arch atherosclerosis. Bilateral calcified pleural plaques are unchanged from prior exam. Pulmonary vasculature is normal. No consolidation, pleural effusion, or pneumothorax. No acute osseous abnormalities are seen. IMPRESSION: 1. No acute abnormality. 2. Bilateral calcified pleural plaques, unchanged. Electronically Signed   By: Jeb Levering M.D.   On: 10/21/2017 01:31   Dg Shoulder Right  Result Date: 10/21/2017 CLINICAL DATA:  Right shoulder pain after fall out of bed. EXAM: RIGHT SHOULDER - 2+ VIEW COMPARISON:  None. FINDINGS: There is no evidence of acute fracture or dislocation. Undersurface spurring at the [facility] joint consistent with osteoarthritis. Calcified pleural plaque is noted of  the adjacent included lung. No adjacent rib fracture. There Soft tissues are unremarkable. IMPRESSION: 1. No acute fracture of the right shoulder. 2. No joint dislocations. 3. AC joint osteoarthritis. 4. Calcified pleural plaque consistent with prior asbestos exposure. Electronically Signed   By: Ashley Royalty M.D.   On: 10/21/2017 00:27   Ct Head Wo Contrast  Result Date: 10/21/2017 CLINICAL DATA:  Weakness. Patient fell and was found on floor. Head and neck pain. EXAM: CT HEAD WITHOUT CONTRAST CT CERVICAL SPINE WITHOUT CONTRAST TECHNIQUE: Multidetector CT imaging of the head and cervical spine was performed following the standard protocol without intravenous contrast. Multiplanar CT image reconstructions of the cervical spine were also generated. COMPARISON:  12/04/2016 FINDINGS: CT HEAD FINDINGS Brain: Mild superficial atrophy with  chronic minimal small vessel ischemic disease. No large vascular territory infarct, hemorrhage midline shift. No hydrocephalus. No intra-axial mass nor extra-axial fluid collections. Vascular: No hyperdense vessels. Skull: No acute skull fracture or suspicious osseous lesions. Sinuses/Orbits: No acute finding. Other: None. CT CERVICAL SPINE FINDINGS Alignment: Mild levoconvex curvature of the cervical spine. Maintained cervical lordosis. Skull base and vertebrae: No acute fracture. No primary bone lesion or focal pathologic process. Soft tissues and spinal canal: No prevertebral fluid or swelling. No visible canal hematoma. Disc levels: Moderate disc space narrowing C5-6 and C6-7 with small posterior marginal osteophytes and uncovertebral joint spurring. Mild C5-6 bilateral neural foraminal narrowing from osteophytes. Upper chest: No acute pulmonary abnormality. Other: Bilateral atherosclerotic calcifications of the extracranial carotid arteries. IMPRESSION: 1. Stable cerebral atrophy with minimal small vessel ischemic disease. No acute intracranial abnormality. 2. Cervical spondylosis with moderate disc space narrowing at C5-6 and C6-7 with associated degenerative uncovertebral joint osteoarthritis and endplate spurring. No acute osseous abnormality. Electronically Signed   By: Ashley Royalty M.D.   On: 10/21/2017 00:33   Ct Cervical Spine Wo Contrast  Result Date: 10/21/2017 CLINICAL DATA:  Weakness. Patient fell and was found on floor. Head and neck pain. EXAM: CT HEAD WITHOUT CONTRAST CT CERVICAL SPINE WITHOUT CONTRAST TECHNIQUE: Multidetector CT imaging of the head and cervical spine was performed following the standard protocol without intravenous contrast. Multiplanar CT image reconstructions of the cervical spine were also generated. COMPARISON:  12/04/2016 FINDINGS: CT HEAD FINDINGS Brain: Mild superficial atrophy with chronic minimal small vessel ischemic disease. No large vascular territory infarct,  hemorrhage midline shift. No hydrocephalus. No intra-axial mass nor extra-axial fluid collections. Vascular: No hyperdense vessels. Skull: No acute skull fracture or suspicious osseous lesions. Sinuses/Orbits: No acute finding. Other: None. CT CERVICAL SPINE FINDINGS Alignment: Mild levoconvex curvature of the cervical spine. Maintained cervical lordosis. Skull base and vertebrae: No acute fracture. No primary bone lesion or focal pathologic process. Soft tissues and spinal canal: No prevertebral fluid or swelling. No visible canal hematoma. Disc levels: Moderate disc space narrowing C5-6 and C6-7 with small posterior marginal osteophytes and uncovertebral joint spurring. Mild C5-6 bilateral neural foraminal narrowing from osteophytes. Upper chest: No acute pulmonary abnormality. Other: Bilateral atherosclerotic calcifications of the extracranial carotid arteries. IMPRESSION: 1. Stable cerebral atrophy with minimal small vessel ischemic disease. No acute intracranial abnormality. 2. Cervical spondylosis with moderate disc space narrowing at C5-6 and C6-7 with associated degenerative uncovertebral joint osteoarthritis and endplate spurring. No acute osseous abnormality. Electronically Signed   By: Ashley Royalty M.D.   On: 10/21/2017 00:33   Dg Knee Complete 4 Views Right  Result Date: 10/21/2017 CLINICAL DATA:  Pain and ecchymosis after fall. EXAM: RIGHT KNEE - COMPLETE 4+ VIEW COMPARISON:  10/19/2014 FINDINGS: Re- demonstration of tricompartmental osteoarthritis of the knee. Small suprapatellar joint effusion. No acute displaced fracture or joint dislocation. IMPRESSION: Tricompartmental osteoarthritis with small joint effusion. No acute osseous abnormality. Electronically Signed   By: Ashley Royalty M.D.   On: 10/21/2017 00:28    Procedures Procedures (including critical care time)  Medications Ordered in ED Medications  cefTRIAXone (ROCEPHIN) 1 g in dextrose 5 % 50 mL IVPB (not administered)      Initial Impression / Assessment and Plan / ED Course  I have reviewed the triage vital signs and the nursing notes.  Pertinent labs & imaging results that were available during my care of the patient were reviewed by me and considered in my medical decision making (see chart for details).  Clinical Course as of Oct 22 307  Tue Oct 21, 2017  0306 Discussed with Dr. Maudie Mercury who will admit.  [HM]    Clinical Course User Index [HM] Lamond Glantz, Gwenlyn Perking    Presents with syncope and recurrent falls today.  Mild ecchymosis along the right shoulder but no evidence of fracture.  Patient with good range of motion of the shoulder.  CT of the head and neck are without acute abnormality.  Chest x-ray without evidence of infection.  Labs reassuring.  EKG is unchanged and negative troponin.  Patient does have UTI and is slightly more lethargic than usual.  She is able to answer orientation questions.  Admitted for additional syncope workup and treatment of her UTI.  The patient was discussed with and seen by Dr. Leonides Schanz who agrees with the treatment plan.   Final Clinical Impressions(s) / ED Diagnoses   Final diagnoses:  Altered mental status, unspecified altered mental status type  Urinary tract infection without hematuria, site unspecified  Syncope, unspecified syncope type  Recurrent falls    New Prescriptions New Prescriptions   No medications on file     Agapito Games 10/21/17 [place], Delice Bison, DO 10/21/17 [phone]

## 2017-10-20 NOTE — Progress Notes (Signed)
Subjective:    Patient ID: Linda [name], female    DOB: [birth year], 71 y.o.   MRN: [id number]  HPI: [name] is a 71year old female who returns for follow up appointmentfor chronic pain and medication refill. She states her pain is located in herupper- lower back radiating into her bilateral lower extremities posteriorly and bilateral knee pain.She rates her pain 10. Her current exercise regime is walking.   Linda [name] reports she was in her wheelchair in her apartment when the wheel became stuck on the rug and she slid out of the wheelchair and landed on her buttocks. Her driver helped her up. Also states the wheelchair was not locked. Reviewed safety and falls precautions, she verbalizes understanding.   Linda [name] Morphine equivalent is 60.00 MME. She  is also prescribed Alprrazolam  by [name].We have discussed the black box warning of using opioids and benzodiazepines.I highlighted the dangers of using these drugs together and discussed the adverse events including respiratory suppression, overdose, cognitive impairment and importance of  compliance with current regimen. She  verbalizes understanding, we will continue to monitor and adjust as indicated.     Last UDS was on 06/03/2017, it was consistent.  Pain Inventory Average Pain 8 Pain Right Now 10 My pain is constant, sharp and aching  In the last 24 hours, has pain interfered with the following? General activity 10 Relation with others 10 Enjoyment of life 10 What TIME of day is your pain at its worst? all Sleep (in general) Fair  Pain is worse with: walking, bending, standing and some activites Pain improves with: rest and medication Relief from Meds: 6  Mobility walk with assistance use a walker how many minutes can you walk? 5-10 ability to climb steps?  no do you drive?  yes  Function retired I need assistance with the following:  dressing, meal prep, household duties and  shopping  Neuro/Psych bladder control problems trouble walking anxiety  Prior Studies Any changes since last visit?  no  Physicians involved in your care Any changes since last visit?  no   Family History  Problem Relation Age of Onset  . Arthritis Mother   . Kidney disease Mother   . Heart disease Father   . Stroke Father   . Hypertension Father   . Diabetes Father   . Heart attack Father   . Heart attack Paternal Grandmother    Social History   Social History  . Marital status: Widowed    Spouse name: N/A  . Number of children: 2  . Years of education: N/A   Occupational History  . Retired    Social History Main Topics  . Smoking status: Never Smoker  . Smokeless tobacco: Never Used     Comment: never used tobacco  . Alcohol use No  . Drug use: No  . Sexual activity: Not Currently   Other Topics Concern  . Not on file   Social History Narrative   Widowed, 2 sons.  Relocated to [place] 09/2012 to be closer to her son who has MS.   Husband d 2015--mesothelioma.   Occupation: former Pharmacist, hospital.   Education: masters degree level.   No T/A/Ds.   Lives in [place], independent living.   Past Surgical History:  Procedure Laterality Date  . APPENDECTOMY  1960  . AXILLARY SURGERY Left 1978   Multiple "lump" in armpit per pt  . CARDIAC CATHETERIZATION  01/2013   nonobstructive CAD, EF 55-60%  .  CARDIOVASCULAR STRESS TEST  02/22/15   Low risk myocard perf imaging; wall motion normal, normal EF  . COCCYX REMOVAL  1972  . DEXA  06/05/2017   T-score -3.1  . DILATION AND CURETTAGE OF UTERUS  ? 1970's  . ESOPHAGOGASTRODUODENOSCOPY N/A 07/19/2014   Gastritis found + in the setting of supratherapeutic INR, +plavix, + meloxicam.  . EYE SURGERY Left 2012-2013   "injections for ~ 1 yr; don't really know what for" (07/12/2013)  . HEEL SPUR SURGERY Left 2008  . KNEE SURGERY  2005  . LEFT HEART CATHETERIZATION WITH CORONARY ANGIOGRAM N/A 01/30/2013   Procedure: LEFT HEART  CATHETERIZATION WITH CORONARY ANGIOGRAM;  Surgeon: Clent Demark, MD;  Location: [facility] CATH LAB;  Service: Cardiovascular;  Laterality: N/A;  . PLANTAR FASCIA RELEASE Left 2008  . SPIROMETRY  04/25/14   In hosp for acute asthma/COPD flare: mixed obstructive and restrictive lung disease. The FEV1 is severely reduced at 45% predicted.  FEV1 signif decreased compared to prior spirometry 07/23/13.  . TENDON RELEASE  1996   Right forearm and hand  . TOTAL ABDOMINAL HYSTERECTOMY  1974  . TRANSTHORACIC ECHOCARDIOGRAM  01/2013; 04/2014;08/2015   2014--NORMAL.  2015--focal basal septal hypertrophy, EF 55-60%, grade I diast dysfxn, mild LAE.  08/2015 EF 55-60%, nl LV syst fxn, grade I DD, valves wnl   Past Medical History:  Diagnosis Date  . Acute upper GI bleed 06/2014   while pt taking coumadin, plavix, and meloxicam---despite being told not to take coumadin.  . Anginal pain ([place])    Nonobstructive CAD 2014; however, her cardiologist put her on a statin for this and NOT for hyperlipidemia per pt report.  Atyp CP 08/2017 at card f/u, plan for myoc perf imaging.  . Anxiety    panic attacks  . Asthma    w/ asbestososis   . BPPV (benign paroxysmal positional vertigo) 12/16/2012  . Chronic diastolic CHF (congestive heart failure) (HCC)    dry wt as of 11/06/16 is 168 lbs.  . Chronic lower back pain   . COPD (chronic obstructive pulmonary disease) ([place])   . DDD (degenerative disc disease)    lumbar and cervical.   . Depression   . Diverticular disease   . Fibromyalgia    Patient states dx was around her late 24s but she had sx's for years prior to this.  . H/O hiatal hernia   . Hay fever   . History of pneumonia    hospitalized 12/2011, 02/2013, and 07/2013 [facility]) for this  . HTN (hypertension)   . Idiopathic angio-edema-urticaria 72014   Angioedema component was very minimal  . Insomnia   . Iron deficiency anemia    Hematologist in [place], [place] did extensive w/u; no cause found; failed oral  supplement;; gets fairly regular (q27m or so) IV iron infusions (Venofer -iron sucrose- 200mg  with procrit.  "for 14 yr I've been getting blood work q month & getting infusions prn" (07/12/2013).  Dr. Marin Olp locally, iron infusions done, EPO deficiency dx'd  . Migraine syndrome    "not as often anymore; used to be ~ q wk" (07/12/2013)  . Mixed incontinence urge and stress   . Nephrolithiasis    "passed all on my own or they are still in there" (07/12/2013)  . OSA on CPAP    prior to move to [place]--had another sleep study 10/2015 w/pulm Dr. Camillo Flaming.  . Osteoarthritis    "severe; progressing fast" (07/12/2013); multiple joints-not surgical candidate for TKR (03/2015)  . Pernicious anemia  08/24/2014  . Pleural plaque with presence of asbestos 07/22/2013  . Pulmonary embolism ([place]) 07/2013   Dx at [facility] with very small peripheral upper lobe pe 07/2013: pt took coumadin for about 8-9 mo  . Pyelonephritis    "several times over the last 30 yr" (07/12/2013)  . RBBB (right bundle branch block)   . Recurrent UTI    hx of hospitalization for pyelonephritis; started abx prophylaxis 06/2015  . Syncope    Hypotensive; ED visit--Dr. Terrence Dupont did Cath--nonobstructive CAD, EF 55-60%.  In retrospect, suspect pt rx med misuse/polypharmacy  . Tension headache, chronic    There were no vitals taken for this visit.  Opioid Risk Score:  1 Fall Risk Score:  `1  Depression screen PHQ 2/9  Depression screen [facility] 2/9 10/20/2017 09/17/2017 08/18/2017 07/21/2017 05/06/2017 08/01/2016 06/12/2016  Decreased Interest 1 1 0 0 0 3 1  Down, Depressed, Hopeless 1 1 3 3 3 3  0  PHQ - 2 Score 2 2 3 3 3 6 1   Altered sleeping - - - - - - -  Tired, decreased energy - - - - - - -  Change in appetite - - - - - - -  Feeling bad or failure about yourself  - - - - - - -  Trouble concentrating - - - - - - -  Moving slowly or fidgety/restless - - - - - - -  Suicidal thoughts - - - - - - -  PHQ-9 Score - - - - - - -  Some recent data might be hidden      Review of Systems  Constitutional: Positive for diaphoresis.  HENT: Negative.   Eyes: Negative.   Respiratory: Positive for cough and shortness of breath.   Cardiovascular: Negative.   Gastrointestinal: Positive for constipation and nausea.  Endocrine: Negative.   Genitourinary:       Bladder control  Musculoskeletal: Positive for gait problem.  Skin: Negative.   Allergic/Immunologic: Negative.   Neurological: Positive for weakness.  Hematological: Negative.   Psychiatric/Behavioral: The patient is nervous/anxious.   All other systems reviewed and are negative.      Objective:   Physical Exam  Constitutional: She is oriented to person, place, and time. She appears well-developed and well-nourished.  HENT:  Head: Normocephalic and atraumatic.  Neck: Normal range of motion. Neck supple.  Cardiovascular: Normal rate and regular rhythm.   Pulmonary/Chest: Effort normal and breath sounds normal.  Musculoskeletal:  Normal Muscle Bulk and Muscle Testing Reveals: Upper Extremities: Full ROM and Muscle Strength 5/5 Bilateral AC Joint Tenderness Thoracic Paraspinal Tenderness: T-1-T-6 Lumbar Paraspinal Tenderness: L-3-L-5 Lower Extremities: Full ROM and Muscle Strength 5/5 Bilateral Lower Extremities Flexion Produces Pain into Bilateral Patella's Arises from Table Slowly using walker for support. Narrow Based Gait  Neurological: She is alert and oriented to person, place, and time.  Skin: Skin is warm and dry.  Psychiatric: She has a normal mood and affect.  Nursing note and vitals reviewed.         Assessment & Plan:  1. Functional deficits secondary to Gait disorder:Continue with HEP.10/20/2017 2. Chronic Back pain/ Lumbar Radiculitis/fibromyalgia /R>L Knee OA Pain Management: 10/20/2017 Refilled: Oxycodone 10/325mg one tablet every 6 hours #100 and Voltaren Gel We will continue the opioid monitoring program, this consists of regular clinic visits, examinations,  urine drug screen, pill counts as well as use of [place] Controlled Substance reporting System. 3. Depression with anxiety/Grief reaction/Mood: 10/20/2017 Continue Xanax,Trazodone and Cymbalta .  4.  [name] with asthma: Oxygen dependent. Albuterol prn.  On Continuous Oxygen Therapy:Pulmonology Following. 10/20/2017 5. Bilateral Osteoarthritis of Bilateral Knees: Dr. Juanda Bond Following. 10/20/2017 6. Fibromyalgia: Continue Home exercise regime. 10/20/2017  20 minutes of face to face patient care time was spent during this visit. All questions were encouraged and answered.  F/U in 1 month

## 2017-10-21 ENCOUNTER — Encounter (HOSPITAL_COMMUNITY): Payer: Self-pay

## 2017-10-21 ENCOUNTER — Emergency Department (HOSPITAL_COMMUNITY): Payer: Medicare Other

## 2017-10-21 ENCOUNTER — Observation Stay (HOSPITAL_COMMUNITY): Payer: Medicare Other

## 2017-10-21 ENCOUNTER — Observation Stay (HOSPITAL_COMMUNITY)
Admit: 2017-10-21 | Discharge: 2017-10-21 | Disposition: A | Discharge status: Home / Self Care | Payer: Medicare Other | Attending: Internal Medicine | Admitting: Internal Medicine

## 2017-10-21 DIAGNOSIS — R55 Syncope and collapse: Secondary | ICD-10-CM | POA: Diagnosis not present

## 2017-10-21 DIAGNOSIS — R4182 Altered mental status, unspecified: Secondary | ICD-10-CM | POA: Diagnosis not present

## 2017-10-21 DIAGNOSIS — R296 Repeated falls: Secondary | ICD-10-CM | POA: Diagnosis not present

## 2017-10-21 DIAGNOSIS — I2510 Atherosclerotic heart disease of native coronary artery without angina pectoris: Secondary | ICD-10-CM | POA: Diagnosis present

## 2017-10-21 DIAGNOSIS — Z7982 Long term (current) use of aspirin: Secondary | ICD-10-CM | POA: Diagnosis not present

## 2017-10-21 DIAGNOSIS — N390 Urinary tract infection, site not specified: Secondary | ICD-10-CM | POA: Diagnosis not present

## 2017-10-21 DIAGNOSIS — E876 Hypokalemia: Secondary | ICD-10-CM | POA: Diagnosis not present

## 2017-10-21 DIAGNOSIS — M1712 Unilateral primary osteoarthritis, left knee: Secondary | ICD-10-CM | POA: Diagnosis present

## 2017-10-21 DIAGNOSIS — Z7983 Long term (current) use of bisphosphonates: Secondary | ICD-10-CM | POA: Diagnosis not present

## 2017-10-21 DIAGNOSIS — G9349 Other encephalopathy: Secondary | ICD-10-CM | POA: Diagnosis present

## 2017-10-21 DIAGNOSIS — M545 Low back pain: Secondary | ICD-10-CM | POA: Diagnosis not present

## 2017-10-21 DIAGNOSIS — S79911A Unspecified injury of right hip, initial encounter: Secondary | ICD-10-CM | POA: Diagnosis not present

## 2017-10-21 DIAGNOSIS — Z8249 Family history of ischemic heart disease and other diseases of the circulatory system: Secondary | ICD-10-CM | POA: Diagnosis not present

## 2017-10-21 DIAGNOSIS — M25551 Pain in right hip: Secondary | ICD-10-CM | POA: Diagnosis not present

## 2017-10-21 DIAGNOSIS — G8929 Other chronic pain: Secondary | ICD-10-CM | POA: Diagnosis present

## 2017-10-21 DIAGNOSIS — I5032 Chronic diastolic (congestive) heart failure: Secondary | ICD-10-CM | POA: Diagnosis present

## 2017-10-21 DIAGNOSIS — M542 Cervicalgia: Secondary | ICD-10-CM | POA: Diagnosis not present

## 2017-10-21 DIAGNOSIS — M25511 Pain in right shoulder: Secondary | ICD-10-CM | POA: Diagnosis not present

## 2017-10-21 DIAGNOSIS — W19XXXA Unspecified fall, initial encounter: Secondary | ICD-10-CM | POA: Diagnosis present

## 2017-10-21 DIAGNOSIS — F419 Anxiety disorder, unspecified: Secondary | ICD-10-CM | POA: Diagnosis present

## 2017-10-21 DIAGNOSIS — I4510 Unspecified right bundle-branch block: Secondary | ICD-10-CM | POA: Diagnosis present

## 2017-10-21 DIAGNOSIS — Z79899 Other long term (current) drug therapy: Secondary | ICD-10-CM | POA: Diagnosis not present

## 2017-10-21 DIAGNOSIS — I110 Hypertensive heart disease with heart failure: Secondary | ICD-10-CM | POA: Diagnosis present

## 2017-10-21 DIAGNOSIS — Z880 Allergy status to penicillin: Secondary | ICD-10-CM | POA: Diagnosis not present

## 2017-10-21 DIAGNOSIS — M25461 Effusion, right knee: Secondary | ICD-10-CM | POA: Diagnosis not present

## 2017-10-21 DIAGNOSIS — S0990XA Unspecified injury of head, initial encounter: Secondary | ICD-10-CM | POA: Diagnosis not present

## 2017-10-21 DIAGNOSIS — Z8261 Family history of arthritis: Secondary | ICD-10-CM | POA: Diagnosis not present

## 2017-10-21 DIAGNOSIS — R51 Headache: Secondary | ICD-10-CM | POA: Diagnosis not present

## 2017-10-21 DIAGNOSIS — M549 Dorsalgia, unspecified: Secondary | ICD-10-CM | POA: Diagnosis not present

## 2017-10-21 DIAGNOSIS — M4186 Other forms of scoliosis, lumbar region: Secondary | ICD-10-CM | POA: Diagnosis not present

## 2017-10-21 DIAGNOSIS — M5136 Other intervertebral disc degeneration, lumbar region: Secondary | ICD-10-CM | POA: Diagnosis present

## 2017-10-21 DIAGNOSIS — J61 Pneumoconiosis due to asbestos and other mineral fibers: Secondary | ICD-10-CM | POA: Diagnosis present

## 2017-10-21 DIAGNOSIS — R52 Pain, unspecified: Secondary | ICD-10-CM | POA: Diagnosis not present

## 2017-10-21 DIAGNOSIS — R0602 Shortness of breath: Secondary | ICD-10-CM | POA: Diagnosis not present

## 2017-10-21 DIAGNOSIS — M797 Fibromyalgia: Secondary | ICD-10-CM | POA: Diagnosis present

## 2017-10-21 DIAGNOSIS — B9689 Other specified bacterial agents as the cause of diseases classified elsewhere: Secondary | ICD-10-CM | POA: Diagnosis present

## 2017-10-21 DIAGNOSIS — S199XXA Unspecified injury of neck, initial encounter: Secondary | ICD-10-CM | POA: Diagnosis not present

## 2017-10-21 DIAGNOSIS — J449 Chronic obstructive pulmonary disease, unspecified: Secondary | ICD-10-CM | POA: Diagnosis present

## 2017-10-21 DIAGNOSIS — Z9981 Dependence on supplemental oxygen: Secondary | ICD-10-CM | POA: Diagnosis not present

## 2017-10-21 HISTORY — PX: OTHER SURGICAL HISTORY: SHX169

## 2017-10-21 LAB — URINALYSIS, ROUTINE W REFLEX MICROSCOPIC
Bilirubin Urine: NEGATIVE
Glucose, UA: NEGATIVE mg/dL
Ketones, ur: NEGATIVE mg/dL
Nitrite: POSITIVE — AB
Protein, ur: 30 mg/dL — AB
SPECIFIC GRAVITY, URINE: 1.016 (ref 1.005–1.030)
pH: 6 (ref 5.0–8.0)

## 2017-10-21 LAB — TSH: TSH: 0.745 u[IU]/mL (ref 0.350–4.500)

## 2017-10-21 LAB — COMPREHENSIVE METABOLIC PANEL
ALBUMIN: 3.9 g/dL (ref 3.5–5.0)
ALK PHOS: 59 U/L (ref 38–126)
ALT: 19 U/L (ref 14–54)
ALT: 18 U/L (ref 14–54)
ANION GAP: 8 (ref 5–15)
AST: 42 U/L — AB (ref 15–41)
AST: 46 U/L — ABNORMAL HIGH (ref 15–41)
Albumin: 3.5 g/dL (ref 3.5–5.0)
Alkaline Phosphatase: 53 U/L (ref 38–126)
Anion gap: 10 (ref 5–15)
BILIRUBIN TOTAL: 0.8 mg/dL (ref 0.3–1.2)
BUN: 11 mg/dL (ref 6–20)
BUN: 12 mg/dL (ref 6–20)
CALCIUM: 8.8 mg/dL — AB (ref 8.9–10.3)
CHLORIDE: 105 mmol/L (ref 101–111)
CO2: 27 mmol/L (ref 22–32)
CO2: 28 mmol/L (ref 22–32)
CREATININE: 0.73 mg/dL (ref 0.44–1.00)
Calcium: 8.6 mg/dL — ABNORMAL LOW (ref 8.9–10.3)
Chloride: 100 mmol/L — ABNORMAL LOW (ref 101–111)
Creatinine, Ser: 0.73 mg/dL (ref 0.44–1.00)
GFR calc Af Amer: >60 mL/min (ref >60)
GFR calc non Af Amer: >60 mL/min (ref >60)
GLUCOSE: 106 mg/dL — AB (ref 65–99)
Glucose, Bld: 109 mg/dL — ABNORMAL HIGH (ref 65–99)
Potassium: 3.2 mmol/L — ABNORMAL LOW (ref 3.5–5.1)
Potassium: 3.1 mmol/L — ABNORMAL LOW (ref 3.5–5.1)
Sodium: 137 mmol/L (ref 135–145)
Sodium: 141 mmol/L (ref 135–145)
TOTAL PROTEIN: 7.1 g/dL (ref 6.5–8.1)
Total Bilirubin: 0.8 mg/dL (ref 0.3–1.2)
Total Protein: 6.5 g/dL (ref 6.5–8.1)

## 2017-10-21 LAB — BRAIN NATRIURETIC PEPTIDE: B NATRIURETIC PEPTIDE 5: 34.5 pg/mL (ref 0.0–100.0)

## 2017-10-21 LAB — CBG MONITORING, ED: GLUCOSE-CAPILLARY: 81 mg/dL (ref 65–99)

## 2017-10-21 LAB — LIPASE, BLOOD: LIPASE: 22 U/L (ref 11–51)

## 2017-10-21 LAB — CBC WITH DIFFERENTIAL/PLATELET
Basophils Absolute: 0 10*3/uL (ref 0.0–0.1)
Basophils Relative: 0 %
EOS PCT: 0 %
Eosinophils Absolute: 0 10*3/uL (ref 0.0–0.7)
HEMATOCRIT: 40.9 % (ref 36.0–46.0)
Hemoglobin: 13.8 g/dL (ref 12.0–15.0)
LYMPHS PCT: 14 %
Lymphs Abs: 1.5 10*3/uL (ref 0.7–4.0)
MCH: 30.1 pg (ref 26.0–34.0)
MCHC: 33.7 g/dL (ref 30.0–36.0)
MCV: 89.1 fL (ref 78.0–100.0)
MONO ABS: 0.9 10*3/uL (ref 0.1–1.0)
MONOS PCT: 8 %
NEUTROS ABS: 8.7 10*3/uL — AB (ref 1.7–7.7)
Neutrophils Relative %: 78 %
Platelets: 172 10*3/uL (ref 150–400)
RBC: 4.59 MIL/uL (ref 3.87–5.11)
RDW: 12.2 % (ref 11.5–15.5)
WBC: 11.2 10*3/uL — ABNORMAL HIGH (ref 4.0–10.5)

## 2017-10-21 LAB — RAPID URINE DRUG SCREEN, HOSP PERFORMED
AMPHETAMINES: NOT DETECTED
Barbiturates: NOT DETECTED
Benzodiazepines: POSITIVE — AB
Cocaine: NOT DETECTED
Opiates: POSITIVE — AB
Tetrahydrocannabinol: NOT DETECTED

## 2017-10-21 LAB — TROPONIN I: Troponin I: <0.03 ng/mL (ref <0.03)

## 2017-10-21 LAB — CBC
HCT: 38.1 % (ref 36.0–46.0)
HEMOGLOBIN: 12.8 g/dL (ref 12.0–15.0)
MCH: 29.9 pg (ref 26.0–34.0)
MCHC: 33.6 g/dL (ref 30.0–36.0)
MCV: 89 fL (ref 78.0–100.0)
PLATELETS: 168 10*3/uL (ref 150–400)
RBC: 4.28 MIL/uL (ref 3.87–5.11)
RDW: 12.1 % (ref 11.5–15.5)
WBC: 7.5 10*3/uL (ref 4.0–10.5)

## 2017-10-21 LAB — ECHOCARDIOGRAM COMPLETE
Height: 63 in
Weight: 2432 oz

## 2017-10-21 LAB — I-STAT TROPONIN, ED: TROPONIN I, POC: 0.01 ng/mL (ref 0.00–0.08)

## 2017-10-21 LAB — MAGNESIUM: Magnesium: 1.9 mg/dL (ref 1.7–2.4)

## 2017-10-21 MED ORDER — ARIPIPRAZOLE 10 MG PO TABS
10.0000 mg | ORAL_TABLET | Freq: Every day | ORAL | Status: DC
  Administered 2017-10-21 – 2017-10-23 (×3): 10 mg via ORAL
  Filled 2017-10-21 (×3): qty 1

## 2017-10-21 MED ORDER — ACETAMINOPHEN 500 MG PO TABS
1000.0000 mg | ORAL_TABLET | Freq: Once | ORAL | Status: AC
  Administered 2017-10-21: 1000 mg via ORAL
  Filled 2017-10-21: qty 2

## 2017-10-21 MED ORDER — POLYETHYLENE GLYCOL 3350 17 G PO PACK
17.0000 g | PACK | Freq: Every day | ORAL | Status: DC | PRN
  Administered 2017-10-23: 17 g via ORAL
  Filled 2017-10-21: qty 1

## 2017-10-21 MED ORDER — LIP MEDEX EX OINT
TOPICAL_OINTMENT | CUTANEOUS | Status: DC | PRN
  Filled 2017-10-21: qty 7

## 2017-10-21 MED ORDER — ALBUTEROL SULFATE (2.5 MG/3ML) 0.083% IN NEBU: 2.5000 mg | INHALATION_SOLUTION | Freq: Four times a day (QID) | RESPIRATORY_TRACT | Status: DC | PRN

## 2017-10-21 MED ORDER — ALENDRONATE SODIUM 70 MG PO TABS: 70.0000 mg | ORAL_TABLET | ORAL | Status: DC

## 2017-10-21 MED ORDER — METOPROLOL SUCCINATE ER 25 MG PO TB24
25.0000 mg | ORAL_TABLET | Freq: Two times a day (BID) | ORAL | Status: DC
  Administered 2017-10-21 – 2017-10-23 (×4): 25 mg via ORAL
  Filled 2017-10-21 (×4): qty 1

## 2017-10-21 MED ORDER — POTASSIUM CHLORIDE CRYS ER 20 MEQ PO TBCR
20.0000 meq | EXTENDED_RELEASE_TABLET | Freq: Every day | ORAL | Status: DC
  Administered 2017-10-21 – 2017-10-22 (×2): 20 meq via ORAL
  Filled 2017-10-21 (×2): qty 1

## 2017-10-21 MED ORDER — OXYCODONE-ACETAMINOPHEN 10-325 MG PO TABS: 1.0000 | ORAL_TABLET | Freq: Four times a day (QID) | ORAL | Status: DC | PRN

## 2017-10-21 MED ORDER — DEXTROSE 5 % IV SOLN
1.0000 g | Freq: Once | INTRAVENOUS | Status: AC
  Administered 2017-10-21: 1 g via INTRAVENOUS
  Filled 2017-10-21: qty 10

## 2017-10-21 MED ORDER — ALBUTEROL SULFATE HFA 108 (90 BASE) MCG/ACT IN AERS
2.0000 | INHALATION_SPRAY | Freq: Four times a day (QID) | RESPIRATORY_TRACT | Status: DC | PRN
  Filled 2017-10-21: qty 6.7

## 2017-10-21 MED ORDER — POTASSIUM CHLORIDE IN NACL 20-0.9 MEQ/L-% IV SOLN
INTRAVENOUS | Status: AC
  Administered 2017-10-21: 07:00:00 via INTRAVENOUS
  Filled 2017-10-21: qty 1000

## 2017-10-21 MED ORDER — POTASSIUM CHLORIDE CRYS ER 20 MEQ PO TBCR
40.0000 meq | EXTENDED_RELEASE_TABLET | Freq: Once | ORAL | Status: AC
  Administered 2017-10-21: 40 meq via ORAL
  Filled 2017-10-21: qty 2

## 2017-10-21 MED ORDER — PANTOPRAZOLE SODIUM 40 MG PO TBEC
40.0000 mg | DELAYED_RELEASE_TABLET | Freq: Every day | ORAL | Status: DC
  Administered 2017-10-21 – 2017-10-23 (×3): 40 mg via ORAL
  Filled 2017-10-21 (×3): qty 1

## 2017-10-21 MED ORDER — ADULT MULTIVITAMIN W/MINERALS CH
1.0000 | ORAL_TABLET | Freq: Every day | ORAL | Status: DC
  Administered 2017-10-21 – 2017-10-23 (×3): 1 via ORAL
  Filled 2017-10-21 (×3): qty 1

## 2017-10-21 MED ORDER — ISOSORBIDE MONONITRATE ER 30 MG PO TB24
30.0000 mg | ORAL_TABLET | Freq: Every day | ORAL | Status: DC
  Administered 2017-10-21 – 2017-10-22 (×2): 30 mg via ORAL
  Filled 2017-10-21 (×2): qty 1

## 2017-10-21 MED ORDER — OXYCODONE HCL 5 MG PO TABS
5.0000 mg | ORAL_TABLET | Freq: Four times a day (QID) | ORAL | Status: DC | PRN
  Administered 2017-10-21 – 2017-10-23 (×4): 5 mg via ORAL
  Filled 2017-10-21 (×4): qty 1

## 2017-10-21 MED ORDER — ACETAMINOPHEN 650 MG RE SUPP: 650.0000 mg | Freq: Four times a day (QID) | RECTAL | Status: DC | PRN

## 2017-10-21 MED ORDER — POTASSIUM CHLORIDE CRYS ER 20 MEQ PO TBCR
40.0000 meq | EXTENDED_RELEASE_TABLET | Freq: Every day | ORAL | Status: DC
  Administered 2017-10-21 – 2017-10-23 (×3): 40 meq via ORAL
  Filled 2017-10-21 (×3): qty 2

## 2017-10-21 MED ORDER — DICLOFENAC SODIUM 1 % TD GEL
2.0000 g | Freq: Three times a day (TID) | TRANSDERMAL | Status: DC | PRN
  Filled 2017-10-21: qty 100

## 2017-10-21 MED ORDER — DEXTROSE 5 % IV SOLN
1.0000 g | INTRAVENOUS | Status: DC
  Administered 2017-10-22 – 2017-10-23 (×2): 1 g via INTRAVENOUS
  Filled 2017-10-21 (×2): qty 10

## 2017-10-21 MED ORDER — TRAZODONE HCL 50 MG PO TABS
50.0000 mg | ORAL_TABLET | Freq: Every evening | ORAL | Status: DC | PRN
  Administered 2017-10-22: 50 mg via ORAL
  Filled 2017-10-21: qty 1

## 2017-10-21 MED ORDER — ENOXAPARIN SODIUM 40 MG/0.4ML ~~LOC~~ SOLN
40.0000 mg | SUBCUTANEOUS | Status: DC
  Administered 2017-10-21 – 2017-10-22 (×2): 40 mg via SUBCUTANEOUS
  Filled 2017-10-21 (×3): qty 0.4

## 2017-10-21 MED ORDER — ACETAMINOPHEN 325 MG PO TABS: 650.0000 mg | ORAL_TABLET | Freq: Four times a day (QID) | ORAL | Status: DC | PRN

## 2017-10-21 MED ORDER — ISOSORBIDE MONONITRATE ER 60 MG PO TB24
60.0000 mg | ORAL_TABLET | Freq: Every day | ORAL | Status: DC
  Administered 2017-10-21 – 2017-10-23 (×3): 60 mg via ORAL
  Filled 2017-10-21 (×3): qty 1

## 2017-10-21 MED ORDER — NITROGLYCERIN 0.4 MG SL SUBL: 0.4000 mg | SUBLINGUAL_TABLET | SUBLINGUAL | Status: DC | PRN

## 2017-10-21 MED ORDER — POTASSIUM CHLORIDE CRYS ER 20 MEQ PO TBCR
20.0000 meq | EXTENDED_RELEASE_TABLET | Freq: Once | ORAL | Status: AC
Start: 2017-10-21 — End: 2017-10-21
  Administered 2017-10-21: 20 meq via ORAL
  Filled 2017-10-21: qty 1

## 2017-10-21 MED ORDER — POTASSIUM CHLORIDE CRYS ER 20 MEQ PO TBCR: 20.0000 meq | EXTENDED_RELEASE_TABLET | ORAL | Status: DC

## 2017-10-21 MED ORDER — OXYCODONE-ACETAMINOPHEN 5-325 MG PO TABS
1.0000 | ORAL_TABLET | Freq: Four times a day (QID) | ORAL | Status: DC | PRN
  Administered 2017-10-21 – 2017-10-23 (×6): 1 via ORAL
  Filled 2017-10-21 (×6): qty 1

## 2017-10-21 MED ORDER — FUROSEMIDE 40 MG PO TABS
40.0000 mg | ORAL_TABLET | Freq: Every day | ORAL | Status: DC
  Administered 2017-10-21 – 2017-10-23 (×3): 40 mg via ORAL
  Filled 2017-10-21 (×3): qty 1

## 2017-10-21 MED ORDER — ALPRAZOLAM 0.5 MG PO TABS
0.5000 mg | ORAL_TABLET | Freq: Three times a day (TID) | ORAL | Status: DC | PRN
  Administered 2017-10-21 – 2017-10-22 (×2): 0.5 mg via ORAL
  Filled 2017-10-21 (×2): qty 1

## 2017-10-21 MED ORDER — DULOXETINE HCL 30 MG PO CPEP
30.0000 mg | ORAL_CAPSULE | Freq: Two times a day (BID) | ORAL | Status: DC
  Administered 2017-10-21 – 2017-10-23 (×5): 30 mg via ORAL
  Filled 2017-10-21 (×5): qty 1

## 2017-10-21 MED ORDER — ASPIRIN 81 MG PO CHEW
81.0000 mg | CHEWABLE_TABLET | Freq: Every day | ORAL | Status: DC
  Administered 2017-10-21 – 2017-10-22 (×2): 81 mg via ORAL
  Filled 2017-10-21 (×2): qty 1

## 2017-10-21 NOTE — Progress Notes (Signed)

## 2017-10-21 NOTE — Progress Notes (Signed)
PROGRESS NOTE    Linda [name]  QIH:[id number] DOB: 1946-01-01 DOA: 10/20/2017 PCP: Tammi Sou, MD    Brief Narrative:  71 y.o. female, 71 y.o.femalewith a hx of BPPV, anxiety and depression,diastolic CHF,asbestosis, COPD on O2 3 lpm, HTN, moderateCAD by cath 2014 -med rx, fibromyalgia w/ chronic pain issues on oxycodone, who apparently presented to ED after 2 falls.  Pt had ?syncopal episode in the morning and fell hitting her head, and then later this past evening lost her balance while transferring from her wheelchair to the toilet and fell hitting her right shoulder.  Pt is a poor historian, denies fever, chills, cough, cp, palp, sob.  She actually doesn't feel like she had syncope w the first fall after thinking of it.    Assessment & Plan:   Principal Problem:   Altered mental status Active Problems:   Hypokalemia   UTI (urinary tract infection)   ? Near syncope  Tele Trop I q6h x3 Carotid ultrasound Cardiac echo Orthostatic VS  Fall:  First fall, she slipped out of wheelchair.  2nd she was unable to get up from bathroom.  Has pain to R shoulder, RLE.   She has some mild R hip pain to palpation and mild midline tenderness to lower thoracic spine.  Pt notes chronic knee pain and feels she's been declining slowly over past 2 years.  Head CT with no acute intracranial abnormality C spine with spondylosis, but no acute abnormality Imaging of R shoulder and knee without acute abnormality L and T spine imaging without acute abnormality [ ]  R hip films [ ]  R tib/fib films - she notes that she feels like she is having a hard time walking due to pain in her R shin.   PT/OT   AMS:  A&Ox2 this morning (thought [place]).  Seems to be improving.  ? If her chronic opiates/benzodiazepines may have contributed to above picture.    ? Secondary to Uti rocephin iv   UTI UA with postive nitrite, pyuria.  Awaiting urine culture Rocephin  iv  Hypokalemia Replete F/u mag  Hypertension Continue metoprolol,   CHF (diastolic) Cont metoprolol, imdur, furosemide, potassium   Anxiety Cont cymbalta, xanax, abilify  Elevated AST: mild, ctm  DVT prophylaxis: lovenox Code Status: full  Family Communication: none Disposition Plan: pending   Consultants:   none  Procedures: (Don't include imaging studies which can be auto populated. Include things that cannot be auto populated i.e. Echo, Carotid and venous dopplers, Foley, Bipap, HD, tubes/drains, wound vac, central lines etc)  pending  Antimicrobials: (specify start and planned stop date. Auto populated tables are space occupying and do not give end dates)  Ceftriaxone 10/29-    Subjective: No LH, dizziness.  C/o some chronic back and knee pain. Things have been going downhill past 2 years with knee pain.  No CP.  Chronic SOB.  Feels weak in general.    Objective: Vitals:   10/21/17 0845 10/21/17 0900 10/21/17 1127 10/21/17 1408  BP:  (!) 141/81 (!) 151/72 (!) 142/67  Pulse: 68 78 77 78  Resp: 19 12 19 16   Temp:    98 F (36.7 C)  TempSrc:    Oral  SpO2: 98% 100% 100% 100%  Weight:      Height:    5\' 3"  (1.6 m)    Intake/Output Summary (Last 24 hours) at 10/21/17 1519 Last data filed at 10/21/17 1500  Gross per 24 hour  Intake  615 ml  Output                0 ml  Net              615 ml   Filed Weights   10/21/17 0057  Weight: 68.9 kg (152 lb)    Examination:  General exam: Appears calm and comfortable  Respiratory system: Clear to auscultation. Respiratory effort normal. Cardiovascular system: S1 & S2 heard, RRR. No JVD, murmurs, rubs, gallops or clicks. No pedal edema. Gastrointestinal system: Abdomen is nondistended, soft and nontender. No organomegaly or masses felt. Normal bowel sounds heard. Central nervous system: Alert and oriented. No focal neurological deficits. Extremities: Symmetric 5 x 5 power. MSK: TTP to R  shoulder, spinous processes of lower T spine, mild to R hip Skin: No rashes, lesions or ulcers Psychiatry: Judgement and insight appear normal. Mood & affect appropriate.     Data Reviewed: I have personally reviewed following labs and imaging studies  CBC:  Recent Labs Lab 10/21/17 0058 10/21/17 0620  WBC 11.2* 7.5  NEUTROABS 8.7*  --   HGB 13.8 12.8  HCT 40.9 38.1  MCV 89.1 89.0  PLT 172 027   Basic Metabolic Panel:  Recent Labs Lab 10/21/17 0058 10/21/17 0620  NA 137 141  K 3.2* 3.1*  CL 100* 105  CO2 27 28  GLUCOSE 106* 109*  BUN 12 11  CREATININE 0.73 0.73  CALCIUM 8.8* 8.6*   GFR: Estimated Creatinine Clearance: 60.1 mL/min (by C-G formula based on SCr of 0.73 mg/dL). Liver Function Tests:  Recent Labs Lab 10/21/17 0058 10/21/17 0620  AST 42* 46*  ALT 19 18  ALKPHOS 59 53  BILITOT 0.8 0.8  PROT 7.1 6.5  ALBUMIN 3.9 3.5    Recent Labs Lab 10/21/17 0058  LIPASE 22   No results for input(s): AMMONIA in the last 168 hours. Coagulation Profile: No results for input(s): INR, PROTIME in the last 168 hours. Cardiac Enzymes:  Recent Labs Lab 10/21/17 0620  TROPONINI <0.03   BNP (last 3 results) No results for input(s): PROBNP in the last 8760 hours. HbA1C: No results for input(s): HGBA1C in the last 72 hours. CBG:  Recent Labs Lab 10/21/17 0205  GLUCAP 81   Lipid Profile: No results for input(s): CHOL, HDL, LDLCALC, TRIG, CHOLHDL, LDLDIRECT in the last 72 hours. Thyroid Function Tests:  Recent Labs  10/21/17 0620  TSH 0.745   Anemia Panel: No results for input(s): VITAMINB12, FOLATE, FERRITIN, TIBC, IRON, RETICCTPCT in the last 72 hours. Sepsis Labs: No results for input(s): PROCALCITON, LATICACIDVEN in the last 168 hours.  No results found for this or any previous visit (from the past 240 hour(s)).       Radiology Studies: Dg Chest 2 View  Result Date: 10/21/2017 CLINICAL DATA:  Shortness of breath. Fall out of bed at  nursing home. EXAM: CHEST  2 VIEW COMPARISON:  Chest radiograph 12/04/2016.  Chest CT 01/01/2016 FINDINGS: The cardiomediastinal contours are unchanged with aortic arch atherosclerosis. Bilateral calcified pleural plaques are unchanged from prior exam. Pulmonary vasculature is normal. No consolidation, pleural effusion, or pneumothorax. No acute osseous abnormalities are seen. IMPRESSION: 1. No acute abnormality. 2. Bilateral calcified pleural plaques, unchanged. Electronically Signed   By: Jeb Levering M.D.   On: 10/21/2017 01:31   Dg Thoracic Spine 2 View  Result Date: 10/21/2017 CLINICAL DATA:  Back pain EXAM: THORACIC SPINE 2 VIEWS COMPARISON:  10/21/2017 FINDINGS: There is a mild curvature of the  thoracic spine which is convex towards the right. The vertebral body heights and disc spaces are well preserved. No fractures identified. Aortic atherosclerosis. Extensive bilateral calcified pleural plaques are noted. IMPRESSION: 1. Mild curvature of the thoracic spine. 2. No fractures noted. Electronically Signed   By: Kerby Moors M.D.   On: 10/21/2017 12:58   Dg Lumbar Spine 2-3 Views  Result Date: 10/21/2017 CLINICAL DATA:  Back pain. EXAM: LUMBAR SPINE - 2-3 VIEW COMPARISON:  12/04/2016. FINDINGS: Diffuse multilevel degenerative change. No acute bony abnormality identified. No evidence of fracture. Aortoiliac atherosclerotic vascular calcification. IMPRESSION: 1. Diffuse osteopenia and degenerative change. Mild scoliosis. No acute abnormality. 2. Aortoiliac atherosclerotic vascular disease. Electronically Signed   By: Marcello Moores  Register   On: 10/21/2017 12:56   Dg Shoulder Right  Result Date: 10/21/2017 CLINICAL DATA:  Right shoulder pain after fall out of bed. EXAM: RIGHT SHOULDER - 2+ VIEW COMPARISON:  None. FINDINGS: There is no evidence of acute fracture or dislocation. Undersurface spurring at the [facility] joint consistent with osteoarthritis. Calcified pleural plaque is noted of the adjacent  included lung. No adjacent rib fracture. There Soft tissues are unremarkable. IMPRESSION: 1. No acute fracture of the right shoulder. 2. No joint dislocations. 3. AC joint osteoarthritis. 4. Calcified pleural plaque consistent with prior asbestos exposure. Electronically Signed   By: Ashley Royalty M.D.   On: 10/21/2017 00:27   Dg Tibia/fibula Right  Result Date: 10/21/2017 CLINICAL DATA:  Recent falls with lower leg pain, initial encounter EXAM: RIGHT TIBIA AND FIBULA - 2 VIEW COMPARISON:  10/20/2017 FINDINGS: Degenerative changes about the right knee joint are seen. No acute fracture or dislocation is noted. No soft tissue abnormality is seen. IMPRESSION: No acute abnormality noted. Electronically Signed   By: Inez Catalina M.D.   On: 10/21/2017 12:56   Ct Head Wo Contrast  Result Date: 10/21/2017 CLINICAL DATA:  Weakness. Patient fell and was found on floor. Head and neck pain. EXAM: CT HEAD WITHOUT CONTRAST CT CERVICAL SPINE WITHOUT CONTRAST TECHNIQUE: Multidetector CT imaging of the head and cervical spine was performed following the standard protocol without intravenous contrast. Multiplanar CT image reconstructions of the cervical spine were also generated. COMPARISON:  12/04/2016 FINDINGS: CT HEAD FINDINGS Brain: Mild superficial atrophy with chronic minimal small vessel ischemic disease. No large vascular territory infarct, hemorrhage midline shift. No hydrocephalus. No intra-axial mass nor extra-axial fluid collections. Vascular: No hyperdense vessels. Skull: No acute skull fracture or suspicious osseous lesions. Sinuses/Orbits: No acute finding. Other: None. CT CERVICAL SPINE FINDINGS Alignment: Mild levoconvex curvature of the cervical spine. Maintained cervical lordosis. Skull base and vertebrae: No acute fracture. No primary bone lesion or focal pathologic process. Soft tissues and spinal canal: No prevertebral fluid or swelling. No visible canal hematoma. Disc levels: Moderate disc space  narrowing C5-6 and C6-7 with small posterior marginal osteophytes and uncovertebral joint spurring. Mild C5-6 bilateral neural foraminal narrowing from osteophytes. Upper chest: No acute pulmonary abnormality. Other: Bilateral atherosclerotic calcifications of the extracranial carotid arteries. IMPRESSION: 1. Stable cerebral atrophy with minimal small vessel ischemic disease. No acute intracranial abnormality. 2. Cervical spondylosis with moderate disc space narrowing at C5-6 and C6-7 with associated degenerative uncovertebral joint osteoarthritis and endplate spurring. No acute osseous abnormality. Electronically Signed   By: Ashley Royalty M.D.   On: 10/21/2017 00:33   Ct Cervical Spine Wo Contrast  Result Date: 10/21/2017 CLINICAL DATA:  Weakness. Patient fell and was found on floor. Head and neck pain. EXAM: CT HEAD WITHOUT CONTRAST  CT CERVICAL SPINE WITHOUT CONTRAST TECHNIQUE: Multidetector CT imaging of the head and cervical spine was performed following the standard protocol without intravenous contrast. Multiplanar CT image reconstructions of the cervical spine were also generated. COMPARISON:  12/04/2016 FINDINGS: CT HEAD FINDINGS Brain: Mild superficial atrophy with chronic minimal small vessel ischemic disease. No large vascular territory infarct, hemorrhage midline shift. No hydrocephalus. No intra-axial mass nor extra-axial fluid collections. Vascular: No hyperdense vessels. Skull: No acute skull fracture or suspicious osseous lesions. Sinuses/Orbits: No acute finding. Other: None. CT CERVICAL SPINE FINDINGS Alignment: Mild levoconvex curvature of the cervical spine. Maintained cervical lordosis. Skull base and vertebrae: No acute fracture. No primary bone lesion or focal pathologic process. Soft tissues and spinal canal: No prevertebral fluid or swelling. No visible canal hematoma. Disc levels: Moderate disc space narrowing C5-6 and C6-7 with small posterior marginal osteophytes and uncovertebral  joint spurring. Mild C5-6 bilateral neural foraminal narrowing from osteophytes. Upper chest: No acute pulmonary abnormality. Other: Bilateral atherosclerotic calcifications of the extracranial carotid arteries. IMPRESSION: 1. Stable cerebral atrophy with minimal small vessel ischemic disease. No acute intracranial abnormality. 2. Cervical spondylosis with moderate disc space narrowing at C5-6 and C6-7 with associated degenerative uncovertebral joint osteoarthritis and endplate spurring. No acute osseous abnormality. Electronically Signed   By: Ashley Royalty M.D.   On: 10/21/2017 00:33   Dg Knee Complete 4 Views Right  Result Date: 10/21/2017 CLINICAL DATA:  Pain and ecchymosis after fall. EXAM: RIGHT KNEE - COMPLETE 4+ VIEW COMPARISON:  10/19/2014 FINDINGS: Re- demonstration of tricompartmental osteoarthritis of the knee. Small suprapatellar joint effusion. No acute displaced fracture or joint dislocation. IMPRESSION: Tricompartmental osteoarthritis with small joint effusion. No acute osseous abnormality. Electronically Signed   By: Ashley Royalty M.D.   On: 10/21/2017 00:28        Scheduled Meds: . ARIPiprazole  10 mg Oral Daily  . aspirin  81 mg Oral QHS  . DULoxetine  30 mg Oral BID  . enoxaparin (LOVENOX) injection  40 mg Subcutaneous Q24H  . furosemide  40 mg Oral Daily  . isosorbide mononitrate  30 mg Oral QHS  . isosorbide mononitrate  60 mg Oral Daily  . metoprolol succinate  25 mg Oral BID  . multivitamin with minerals  1 tablet Oral Daily  . pantoprazole  40 mg Oral Daily  . potassium chloride SA  20 mEq Oral UD   Continuous Infusions: . 0.9 % NaCl with KCl 20 mEq / L 75 mL/hr at 10/21/17 0728  . [START ON 10/22/2017] cefTRIAXone (ROCEPHIN)  IV       LOS: 0 days    Time spent: over 30 min    Fayrene Helper, MD [facility] Pager [phone]   If 7PM-7AM, please contact night-coverage [email or web address] Password [email or web address] 10/21/2017, 3:19 PM

## 2017-10-21 NOTE — ED Notes (Signed)
Patient transported to X-ray 

## 2017-10-21 NOTE — H&P (Signed)
TRH H&P   Patient Demographics:    Linda [name], is a 71 y.o. female  MRN: [id number]   DOB - 1946-07-04  Admit Date - 10/20/2017  Outpatient Primary MD for the patient is McGowen, Adrian Blackwater, MD  Referring MD/NP/PA:   [name]  Outpatient Specialists:   Patient coming from: [facility]  Chief Complaint  Patient presents with  . Fall      HPI:    Linda [name]  is a 71 y.o. female, 71 y.o. female with a hx of BPPV, anxiety and depression,diastolic CHF,asbestosis, COPD on O2 3 lpm, HTN, moderate CAD by cath 2014 -med rx, fibromyalgia w/ chronic pain issues on oxycodone,  who apparently presented to ED after 2 falls.  Pt had ?syncopal episode in the morning and fell hitting her head, and then later this past evening lost her balance while transferring from her wheelchair to the toilet and fell hitting her right shoulder.  Pt is a poor historian, denies fever, chills, cough, cp, palp, sob.  She actually doesn't feel like she had syncope w the first fall after thinking of it.   In ED,  EKG NSR at 80, nl axis, RBBB, no st-t changes c/w ischemia. Trop I 0.01,   CT brain / C- spine IMPRESSION: 1. Stable cerebral atrophy with minimal small vessel ischemic disease. No acute intracranial abnormality. 2. Cervical spondylosis with moderate disc space narrowing at C5-6 and C6-7 with associated degenerative uncovertebral joint osteoarthritis and endplate spurring. No acute osseous abnormality.  CXR  IMPRESSION: 1. No acute abnormality. 2. Bilateral calcified pleural plaques, unchanged.  K 3.2 Bun 12, Creatinine 0.73 Wbc 11.2, Hgb 13.8, Plt 172 ua wbc 6-30 UDS + opioids and benzodiazepines  Pt will be admitted for possible syncope as well as altered mental status secondary to UTI.    Review of systems:    In addition to the HPI above,  No Fever-chills, No  Headache, No changes with Vision or hearing, No problems swallowing food or Liquids, No Chest pain, Cough or Shortness of Breath, No Abdominal pain, No Nausea or Vommitting, Bowel movements are regular, No Blood in stool or Urine, No dysuria, No new skin rashes or bruises,  No new weakness, tingling, numbness in any extremity, No recent weight gain or loss, No polyuria, polydypsia or polyphagia, No significant Mental Stressors.  A full 10 point Review of Systems was done, except as stated above, all other Review of Systems were negative.   With Past History of the following :    Past Medical History:  Diagnosis Date  . Acute upper GI bleed 06/2014   while pt taking coumadin, plavix, and meloxicam---despite being told not to take coumadin.  . Anginal pain ([place])    Nonobstructive CAD 2014; however, her cardiologist put her on a statin for this and NOT for hyperlipidemia per pt report.  Atyp CP 08/2017 at  card f/u, plan for myoc perf imaging.  . Anxiety    panic attacks  . Asthma    w/ asbestososis   . BPPV (benign paroxysmal positional vertigo) 12/16/2012  . Chronic diastolic CHF (congestive heart failure) (HCC)    dry wt as of 11/06/16 is 168 lbs.  . Chronic lower back pain   . COPD (chronic obstructive pulmonary disease) ([place])   . DDD (degenerative disc disease)    lumbar and cervical.   . Depression   . Diverticular disease   . Fibromyalgia    Patient states dx was around her late 25s but she had sx's for years prior to this.  . H/O hiatal hernia   . Hay fever   . History of pneumonia    hospitalized 12/2011, 02/2013, and 07/2013 [facility]) for this  . HTN (hypertension)   . Idiopathic angio-edema-urticaria 72014   Angioedema component was very minimal  . Insomnia   . Iron deficiency anemia    Hematologist in [place], [place] did extensive w/u; no cause found; failed oral supplement;; gets fairly regular (q82m or so) IV iron infusions (Venofer -iron sucrose- 200mg  with procrit.   "for 14 yr I've been getting blood work q month & getting infusions prn" (07/12/2013).  Dr. Marin Olp locally, iron infusions done, EPO deficiency dx'd  . Migraine syndrome    "not as often anymore; used to be ~ q wk" (07/12/2013)  . Mixed incontinence urge and stress   . Nephrolithiasis    "passed all on my own or they are still in there" (07/12/2013)  . OSA on CPAP    prior to move to [place]--had another sleep study 10/2015 w/pulm Dr. Camillo Flaming.  . Osteoarthritis    "severe; progressing fast" (07/12/2013); multiple joints-not surgical candidate for TKR (03/2015)  . Pernicious anemia 08/24/2014  . Pleural plaque with presence of asbestos 07/22/2013  . Pulmonary embolism ([place]) 07/2013   Dx at [facility] with very small peripheral upper lobe pe 07/2013: pt took coumadin for about 8-9 mo  . Pyelonephritis    "several times over the last 30 yr" (07/12/2013)  . RBBB (right bundle branch block)   . Recurrent UTI    hx of hospitalization for pyelonephritis; started abx prophylaxis 06/2015  . Syncope    Hypotensive; ED visit--Dr. Terrence Dupont did Cath--nonobstructive CAD, EF 55-60%.  In retrospect, suspect pt rx med misuse/polypharmacy  . Tension headache, chronic       Past Surgical History:  Procedure Laterality Date  . APPENDECTOMY  1960  . AXILLARY SURGERY Left 1978   Multiple "lump" in armpit per pt  . CARDIAC CATHETERIZATION  01/2013   nonobstructive CAD, EF 55-60%  . CARDIOVASCULAR STRESS TEST  02/22/15   Low risk myocard perf imaging; wall motion normal, normal EF  . COCCYX REMOVAL  1972  . DEXA  06/05/2017   T-score -3.1  . DILATION AND CURETTAGE OF UTERUS  ? 1970's  . ESOPHAGOGASTRODUODENOSCOPY N/A 07/19/2014   Gastritis found + in the setting of supratherapeutic INR, +plavix, + meloxicam.  . EYE SURGERY Left 2012-2013   "injections for ~ 1 yr; don't really know what for" (07/12/2013)  . HEEL SPUR SURGERY Left 2008  . KNEE SURGERY  2005  . LEFT HEART CATHETERIZATION WITH CORONARY ANGIOGRAM N/A 01/30/2013    Procedure: LEFT HEART CATHETERIZATION WITH CORONARY ANGIOGRAM;  Surgeon: Clent Demark, MD;  Location: [facility] CATH LAB;  Service: Cardiovascular;  Laterality: N/A;  . PLANTAR FASCIA RELEASE Left 2008  . SPIROMETRY  04/25/14  In hosp for acute asthma/COPD flare: mixed obstructive and restrictive lung disease. The FEV1 is severely reduced at 45% predicted.  FEV1 signif decreased compared to prior spirometry 07/23/13.  . TENDON RELEASE  1996   Right forearm and hand  . TOTAL ABDOMINAL HYSTERECTOMY  1974  . TRANSTHORACIC ECHOCARDIOGRAM  01/2013; 04/2014;08/2015   2014--NORMAL.  2015--focal basal septal hypertrophy, EF 55-60%, grade I diast dysfxn, mild LAE.  08/2015 EF 55-60%, nl LV syst fxn, grade I DD, valves wnl      Social History:     Social History  Substance Use Topics  . Smoking status: Never Smoker  . Smokeless tobacco: Never Used     Comment: never used tobacco  . Alcohol use No     Lives - at [facility] - walks by self   Family History :     Family History  Problem Relation Age of Onset  . Arthritis Mother   . Kidney disease Mother   . Heart disease Father   . Stroke Father   . Hypertension Father   . Diabetes Father   . Heart attack Father   . Heart attack Paternal Grandmother       Home Medications:   Prior to Admission medications   Medication Sig Start Date End Date Taking? Authorizing Provider  albuterol (PROVENTIL HFA;VENTOLIN HFA) 108 (90 BASE) MCG/ACT inhaler Inhale 2 puffs into the lungs every 6 (six) hours as needed for wheezing or shortness of breath. 09/15/14  Yes McGowen, Adrian Blackwater, MD  alendronate (FOSAMAX) 70 MG tablet Take 1 tablet (70 mg total) by mouth every 7 (seven) days. Take with a full glass of water on an empty stomach, first thing in the morning and remain up right for 30 minutes after taking. 06/05/17  Yes McGowen, Adrian Blackwater, MD  ALPRAZolam Duanne Moron) 1 MG tablet TAKE 1 TABLET THREE TIMES DAILY AS NEEDED FOR ANXIETY. 10/15/17  Yes  McGowen, Adrian Blackwater, MD  ARIPiprazole (ABILIFY) 10 MG tablet TAKE 1 TABLET ONCE DAILY. 10/17/16  Yes McGowen, Adrian Blackwater, MD  aspirin 81 MG tablet Take 81 mg by mouth at bedtime.   Yes [provider]  diclofenac sodium (VOLTAREN) 1 % GEL Apply 2 g topically 3 (three) times daily as needed (pain). 11/19/16  Yes Bayard Hugger, NP  DULoxetine (CYMBALTA) 30 MG capsule TAKE 1 CAPSULE ONCE DAILY ALONG WITH 60MG  CAPSULE FOR TOTAL 90MG  DAILY. 10/14/17  Yes McGowen, Adrian Blackwater, MD  DULoxetine (CYMBALTA) 60 MG capsule TAKE 1 CAPSULE A DAY TO BE COMBINED WITH 30MG  CAPSULE 07/25/17  Yes McGowen, Adrian Blackwater, MD  furosemide (LASIX) 40 MG tablet Take 1 tablet (40 mg total) by mouth daily. 12/06/16  Yes Dhungel, Nishant, MD  isosorbide mononitrate (IMDUR) 30 MG 24 hr tablet Take 1 tablet (30 mg total) by mouth 2 (two) times daily. 60mg  in the am and 30mg  in the pm 09/16/17  Yes Crenshaw, Denice Bors, MD  metoprolol succinate (TOPROL XL) 25 MG 24 hr tablet Take 1 tablet (25 mg total) by mouth 2 (two) times daily. 11/07/16  Yes Barrett, Evelene Croon, PA-C  Multiple Vitamin (MULTIVITAMIN WITH MINERALS) TABS tablet Take 1 tablet by mouth daily.   Yes [provider]  nitroGLYCERIN (NITROSTAT) 0.4 MG SL tablet Place 1 tablet (0.4 mg total) under the tongue every 5 (five) minutes as needed for chest pain (x 3 doses). 06/02/17  Yes Barrett, Evelene Croon, PA-C  oxyCODONE-acetaminophen (PERCOCET) 10-325 MG tablet Take 1 tablet by mouth  every 6 (six) hours as needed for pain. 10/20/17  Yes Bayard Hugger, NP  pantoprazole (PROTONIX) 40 MG tablet TAKE 1 TABLET DAILY. 02/26/17  Yes McGowen, Adrian Blackwater, MD  polyethylene glycol (MIRALAX / GLYCOLAX) packet Take 17 g by mouth daily as needed (constipation). Mix in 8 oz liquid and drink   Yes [provider]  potassium chloride SA (K-DUR,KLOR-CON) 20 MEQ tablet Take 20 mEq by mouth as directed. Take 2 tablets by mouth in the morning and tablet in the evening   Yes [provider]  traZODone (DESYREL) 50 MG tablet TAKE 2-4 TABLETS AT BEDTIME 05/21/17  Yes McGowen, Adrian Blackwater, MD  Vitamin D, Ergocalciferol, (DRISDOL) 50000 units CAPS capsule 1 tab po TWICE per week 05/29/17  Yes McGowen, Adrian Blackwater, MD  OXYGEN Inhale 3 L into the lungs continuous.    [provider]     Allergies:     Allergies  Allergen Reactions  . Penicillins Itching, Swelling and Rash    Tolerated Cefepime in ED. Has patient had a PCN reaction causing immediate rash, facial/tongue/throat swelling, SOB or lightheadedness with hypotension: Yes Has patient had a PCN reaction causing severe rash involving mucus membranes or skin necrosis: No Has patient had a PCN reaction that required hospitalization: No  Has patient had a PCN reaction occurring within the last 10 years: No      Physical Exam:   Vitals  Blood pressure (!) 131/55, pulse 77, temperature 98.3 F (36.8 C), temperature source Oral, resp. rate 17, height 5\' 3"  (1.6 m), weight 68.9 kg (152 lb), SpO2 98 %.   1. General  lying in bed in NAD,    2. Normal affect and insight, Not Suicidal or Homicidal, Awake Alert, Oriented X 3.  3. No F.N deficits, ALL C.Nerves Intact, Strength 5/5 all 4 extremities, Sensation intact all 4 extremities, Plantars down going.  4. Ears and Eyes appear Normal, Conjunctivae clear, PERRLA. Moist Oral Mucosa.  5. Supple Neck, No JVD, No cervical lymphadenopathy appriciated, No Carotid Bruits.  6. Symmetrical Chest wall movement, Good air movement bilaterally, CTAB.  7. RRR, No Gallops, Rubs or Murmurs, No Parasternal Heave.  8. Positive Bowel Sounds, Abdomen Soft, No tenderness, No organomegaly appriciated,No rebound -guarding or rigidity.  9.  No Cyanosis, Normal Skin Turgor, No Skin Rash or Bruise.  10. Good muscle tone,  joints appear normal , no effusions, Normal ROM.  11. No Palpable Lymph Nodes in Neck or Axillae  Slight crepitus bilateral knees    Data Review:     CBC  Recent Labs Lab 10/21/17 0058  WBC 11.2*  HGB 13.8  HCT 40.9  PLT 172  MCV 89.1  MCH 30.1  MCHC 33.7  RDW 12.2  LYMPHSABS 1.5  MONOABS 0.9  EOSABS 0.0  BASOSABS 0.0   ------------------------------------------------------------------------------------------------------------------  Chemistries   Recent Labs Lab 10/21/17 0058  NA 137  K 3.2*  CL 100*  CO2 27  GLUCOSE 106*  BUN 12  CREATININE 0.73  CALCIUM 8.8*  AST 42*  ALT 19  ALKPHOS 59  BILITOT 0.8   ------------------------------------------------------------------------------------------------------------------ estimated creatinine clearance is 60.1 mL/min (by C-G formula based on SCr of 0.73 mg/dL). ------------------------------------------------------------------------------------------------------------------ No results for input(s): TSH, T4TOTAL, T3FREE, THYROIDAB in the last 72 hours.  Invalid input(s): FREET3  Coagulation profile No results for input(s): INR, PROTIME in the last 168 hours. ------------------------------------------------------------------------------------------------------------------- No results for input(s): DDIMER in the last 72 hours. -------------------------------------------------------------------------------------------------------------------  Cardiac Enzymes No results for input(s): CKMB,  TROPONINI, MYOGLOBIN in the last 168 hours.  Invalid input(s): CK ------------------------------------------------------------------------------------------------------------------    Component Value Date/Time   BNP 34.5 10/21/2017 0058   BNP 19.9 10/27/2015 1602     ---------------------------------------------------------------------------------------------------------------  Urinalysis    Component Value Date/Time   COLORURINE YELLOW 10/21/2017 0200   APPEARANCEUR HAZY (A) 10/21/2017 0200   LABSPEC 1.016 10/21/2017 0200   PHURINE 6.0 10/21/2017 0200   GLUCOSEU  NEGATIVE 10/21/2017 0200   HGBUR SMALL (A) 10/21/2017 0200   BILIRUBINUR NEGATIVE 10/21/2017 0200   BILIRUBINUR small 10/01/2016 1253   KETONESUR NEGATIVE 10/21/2017 0200   PROTEINUR 30 (A) 10/21/2017 0200   UROBILINOGEN 1.0 10/01/2016 1253   UROBILINOGEN 1.0 10/16/2015 2009   NITRITE POSITIVE (A) 10/21/2017 0200   LEUKOCYTESUR SMALL (A) 10/21/2017 0200    ----------------------------------------------------------------------------------------------------------------   Imaging Results:    Dg Chest 2 View  Result Date: 10/21/2017 CLINICAL DATA:  Shortness of breath. Fall out of bed at nursing home. EXAM: CHEST  2 VIEW COMPARISON:  Chest radiograph 12/04/2016.  Chest CT 01/01/2016 FINDINGS: The cardiomediastinal contours are unchanged with aortic arch atherosclerosis. Bilateral calcified pleural plaques are unchanged from prior exam. Pulmonary vasculature is normal. No consolidation, pleural effusion, or pneumothorax. No acute osseous abnormalities are seen. IMPRESSION: 1. No acute abnormality. 2. Bilateral calcified pleural plaques, unchanged. Electronically Signed   By: Jeb Levering M.D.   On: 10/21/2017 01:31   Dg Shoulder Right  Result Date: 10/21/2017 CLINICAL DATA:  Right shoulder pain after fall out of bed. EXAM: RIGHT SHOULDER - 2+ VIEW COMPARISON:  None. FINDINGS: There is no evidence of acute fracture or dislocation. Undersurface spurring at the [facility] joint consistent with osteoarthritis. Calcified pleural plaque is noted of the adjacent included lung. No adjacent rib fracture. There Soft tissues are unremarkable. IMPRESSION: 1. No acute fracture of the right shoulder. 2. No joint dislocations. 3. AC joint osteoarthritis. 4. Calcified pleural plaque consistent with prior asbestos exposure. Electronically Signed   By: Ashley Royalty M.D.   On: 10/21/2017 00:27   Ct Head Wo Contrast  Result Date: 10/21/2017 CLINICAL DATA:  Weakness. Patient fell and was found on floor. Head and neck  pain. EXAM: CT HEAD WITHOUT CONTRAST CT CERVICAL SPINE WITHOUT CONTRAST TECHNIQUE: Multidetector CT imaging of the head and cervical spine was performed following the standard protocol without intravenous contrast. Multiplanar CT image reconstructions of the cervical spine were also generated. COMPARISON:  12/04/2016 FINDINGS: CT HEAD FINDINGS Brain: Mild superficial atrophy with chronic minimal small vessel ischemic disease. No large vascular territory infarct, hemorrhage midline shift. No hydrocephalus. No intra-axial mass nor extra-axial fluid collections. Vascular: No hyperdense vessels. Skull: No acute skull fracture or suspicious osseous lesions. Sinuses/Orbits: No acute finding. Other: None. CT CERVICAL SPINE FINDINGS Alignment: Mild levoconvex curvature of the cervical spine. Maintained cervical lordosis. Skull base and vertebrae: No acute fracture. No primary bone lesion or focal pathologic process. Soft tissues and spinal canal: No prevertebral fluid or swelling. No visible canal hematoma. Disc levels: Moderate disc space narrowing C5-6 and C6-7 with small posterior marginal osteophytes and uncovertebral joint spurring. Mild C5-6 bilateral neural foraminal narrowing from osteophytes. Upper chest: No acute pulmonary abnormality. Other: Bilateral atherosclerotic calcifications of the extracranial carotid arteries. IMPRESSION: 1. Stable cerebral atrophy with minimal small vessel ischemic disease. No acute intracranial abnormality. 2. Cervical spondylosis with moderate disc space narrowing at C5-6 and C6-7 with associated degenerative uncovertebral joint osteoarthritis and endplate spurring. No acute osseous abnormality. Electronically Signed   By: Meredith Leeds.D.  On: 10/21/2017 00:33   Ct Cervical Spine Wo Contrast  Result Date: 10/21/2017 CLINICAL DATA:  Weakness. Patient fell and was found on floor. Head and neck pain. EXAM: CT HEAD WITHOUT CONTRAST CT CERVICAL SPINE WITHOUT CONTRAST TECHNIQUE:  Multidetector CT imaging of the head and cervical spine was performed following the standard protocol without intravenous contrast. Multiplanar CT image reconstructions of the cervical spine were also generated. COMPARISON:  12/04/2016 FINDINGS: CT HEAD FINDINGS Brain: Mild superficial atrophy with chronic minimal small vessel ischemic disease. No large vascular territory infarct, hemorrhage midline shift. No hydrocephalus. No intra-axial mass nor extra-axial fluid collections. Vascular: No hyperdense vessels. Skull: No acute skull fracture or suspicious osseous lesions. Sinuses/Orbits: No acute finding. Other: None. CT CERVICAL SPINE FINDINGS Alignment: Mild levoconvex curvature of the cervical spine. Maintained cervical lordosis. Skull base and vertebrae: No acute fracture. No primary bone lesion or focal pathologic process. Soft tissues and spinal canal: No prevertebral fluid or swelling. No visible canal hematoma. Disc levels: Moderate disc space narrowing C5-6 and C6-7 with small posterior marginal osteophytes and uncovertebral joint spurring. Mild C5-6 bilateral neural foraminal narrowing from osteophytes. Upper chest: No acute pulmonary abnormality. Other: Bilateral atherosclerotic calcifications of the extracranial carotid arteries. IMPRESSION: 1. Stable cerebral atrophy with minimal small vessel ischemic disease. No acute intracranial abnormality. 2. Cervical spondylosis with moderate disc space narrowing at C5-6 and C6-7 with associated degenerative uncovertebral joint osteoarthritis and endplate spurring. No acute osseous abnormality. Electronically Signed   By: Ashley Royalty M.D.   On: 10/21/2017 00:33   Dg Knee Complete 4 Views Right  Result Date: 10/21/2017 CLINICAL DATA:  Pain and ecchymosis after fall. EXAM: RIGHT KNEE - COMPLETE 4+ VIEW COMPARISON:  10/19/2014 FINDINGS: Re- demonstration of tricompartmental osteoarthritis of the knee. Small suprapatellar joint effusion. No acute displaced  fracture or joint dislocation. IMPRESSION: Tricompartmental osteoarthritis with small joint effusion. No acute osseous abnormality. Electronically Signed   By: Ashley Royalty M.D.   On: 10/21/2017 00:28      Assessment & Plan:    Principal Problem:   Altered mental status Active Problems:   Hypokalemia   UTI (urinary tract infection)    ? Near syncope Tele Trop I q6h x3 Carotid ultrasound Cardiac echo  AMS Improving ? Secondary to Uti rocephin iv   UTI Awaiting urine culture Rocephin iv  Hypokalemia Replete Check cmp in am  Hypertension Continue metoprolol,   CHF (diastolic) Cont metoprolol, imdur, furosemide, potassium   Anxiety Cont cymbalta, xanax, abilify      DVT Prophylaxis Lovenox - SCDs  AM Labs Ordered, also please review Full Orders  Family Communication: Admission, patients condition and plan of care including tests being ordered have been discussed with the patient  who indicate understanding and agree with the plan and Code Status.  Code Status FULL CODE  Likely DC to  home  Condition GUARDED    Consults called: none  Admission status: observation   Time spent in minutes : 45   Jani Gravel M.D on 10/21/2017 at 4:49 AM  Between 7am to 7pm - Pager - [phone]. After 7pm go to [email or web address] - password [facility]  [facility] - Office  [phone]

## 2017-10-21 NOTE — ED Notes (Signed)
Blood draw was attempted x2, no success for troponin.

## 2017-10-21 NOTE — Progress Notes (Signed)
  Echocardiogram 2D Echocardiogram has been performed.  Cambree Hendrix T Everson Mott 10/21/2017, 2:17 PM

## 2017-10-21 NOTE — Progress Notes (Signed)
Carotid artery duplex has been completed. Preliminary results can be found in chart review -> CV Proc  10/21/17 2:44 PM Carlos Levering RVT

## 2017-10-22 DIAGNOSIS — E876 Hypokalemia: Secondary | ICD-10-CM

## 2017-10-22 DIAGNOSIS — R52 Pain, unspecified: Secondary | ICD-10-CM

## 2017-10-22 DIAGNOSIS — N390 Urinary tract infection, site not specified: Secondary | ICD-10-CM

## 2017-10-22 DIAGNOSIS — R4182 Altered mental status, unspecified: Secondary | ICD-10-CM

## 2017-10-22 LAB — BASIC METABOLIC PANEL
Anion gap: 10 (ref 5–15)
BUN: 12 mg/dL (ref 6–20)
CALCIUM: 8.5 mg/dL — AB (ref 8.9–10.3)
CO2: 28 mmol/L (ref 22–32)
CREATININE: 0.76 mg/dL (ref 0.44–1.00)
Chloride: 102 mmol/L (ref 101–111)
GFR calc Af Amer: >60 mL/min (ref >60)
GFR calc non Af Amer: >60 mL/min (ref >60)
GLUCOSE: 107 mg/dL — AB (ref 65–99)
Potassium: 3.3 mmol/L — ABNORMAL LOW (ref 3.5–5.1)
Sodium: 140 mmol/L (ref 135–145)

## 2017-10-22 LAB — CBC
HCT: 38.8 % (ref 36.0–46.0)
HEMOGLOBIN: 12.4 g/dL (ref 12.0–15.0)
MCH: 29 pg (ref 26.0–34.0)
MCHC: 32 g/dL (ref 30.0–36.0)
MCV: 90.7 fL (ref 78.0–100.0)
PLATELETS: 185 10*3/uL (ref 150–400)
RBC: 4.28 MIL/uL (ref 3.87–5.11)
RDW: 12.2 % (ref 11.5–15.5)
WBC: 6.3 10*3/uL (ref 4.0–10.5)

## 2017-10-22 MED ORDER — ALPRAZOLAM 1 MG PO TABS
1.0000 mg | ORAL_TABLET | Freq: Three times a day (TID) | ORAL | Status: DC | PRN
  Administered 2017-10-22 – 2017-10-23 (×3): 1 mg via ORAL
  Filled 2017-10-22 (×3): qty 1

## 2017-10-22 NOTE — Progress Notes (Signed)
[facility] PROGRESS NOTE  Linda [name] DQQ:[id number] DOB: 05-Mar-1946 DOA: 10/20/2017  PCP: Tammi Sou, MD  Brief History/Interval Summary: 71 y.o.femalewith a hx of BPPV, anxiety and depression,diastolic CHF,asbestosis, COPD on O2 3 lpm, HTN, moderateCAD by cath 2014 -med rx, fibromyalgia w/ chronic pain issues on oxycodone, who apparently presented to ED after 2 falls.  There was some concern for syncope although it is not entirely clear if she did pass out or not.  Patient also uses a wheelchair only rarely.  Typically walks using a walker or cane.  Reason for Visit: Urinary tract infection  Consultants: None  Procedures:   Transthoracic echocardiogram Study Conclusions  - Left ventricle: The cavity size was normal. Systolic function was   vigorous. The estimated ejection fraction was in the range of 65%   to 70%. Wall motion was normal; there were no regional wall   motion abnormalities. Doppler parameters are consistent with   abnormal left ventricular relaxation (grade 1 diastolic   dysfunction).  Antibiotics: Ceftriaxone  Subjective/Interval History: Patient feels slightly better.  Continues to have on and off pain in her lower extremities which is chronic for her.  She does have severe degenerative disc disease in her back.  Occasionally gets radiculopathic pain.  However when she fell she did mention that her right leg almost gave out.  Denies any weakness at this time.  ROS: Denies any chest pain or shortness of breath  Objective:  Vital Signs  Vitals:   10/21/17 1408 10/21/17 2019 10/22/17 0544 10/22/17 1420  BP: (!) 142/67 (!) 140/59 (!) 116/56 129/64  Pulse: 78 70 71 81  Resp: 16 16 16    Temp: 98 F (36.7 C) 98 F (36.7 C) 97.8 F (36.6 C) 98.1 F (36.7 C)  TempSrc: Oral Oral Oral Oral  SpO2: 100% 95% 96% 99%  Weight:      Height: 5\' 3"  (1.6 m)       Intake/Output Summary (Last 24 hours) at 10/22/17 1442 Last data filed  at 10/21/17 1700  Gross per 24 hour  Intake              805 ml  Output                0 ml  Net              805 ml   Filed Weights   10/21/17 0057  Weight: 68.9 kg (152 lb)    General appearance: alert, cooperative, appears stated age and no distress Resp: clear to auscultation bilaterally Cardio: regular rate and rhythm, S1, S2 normal, no murmur, click, rub or gallop GI: soft, non-tender; bowel sounds normal; no masses,  no organomegaly Extremities: extremities normal, atraumatic, no cyanosis or edema.  Mildly positive SLR on right Neurologic: No focal deficits.  Able to lift both legs off the bed.  Good reflexes bilaterally.  Lab Results:  Data Reviewed: I have personally reviewed following labs and imaging studies  CBC:  Recent Labs Lab 10/21/17 0058 10/21/17 0620 10/22/17 0607  WBC 11.2* 7.5 6.3  NEUTROABS 8.7*  --   --   HGB 13.8 12.8 12.4  HCT 40.9 38.1 38.8  MCV 89.1 89.0 90.7  PLT 172 168 194    Basic Metabolic Panel:  Recent Labs Lab 10/21/17 0058 10/21/17 0620 10/21/17 1439 10/22/17 0607  NA 137 141  --  140  K 3.2* 3.1*  --  3.3*  CL 100* 105  --  102  CO2 27 28  --  28  GLUCOSE 106* 109*  --  107*  BUN 12 11  --  12  CREATININE 0.73 0.73  --  0.76  CALCIUM 8.8* 8.6*  --  8.5*  MG  --   --  1.9  --     GFR: Estimated Creatinine Clearance: 60.1 mL/min (by C-G formula based on SCr of 0.76 mg/dL).  Liver Function Tests:  Recent Labs Lab 10/21/17 0058 10/21/17 0620  AST 42* 46*  ALT 19 18  ALKPHOS 59 53  BILITOT 0.8 0.8  PROT 7.1 6.5  ALBUMIN 3.9 3.5     Recent Labs Lab 10/21/17 0058  LIPASE 22   Cardiac Enzymes:  Recent Labs Lab 10/21/17 0620 10/21/17 1439 10/21/17 2010  TROPONINI <0.03 <0.03 <0.03   CBG:  Recent Labs Lab 10/21/17 0205  GLUCAP 81   Thyroid Function Tests:  Recent Labs  10/21/17 0620  TSH 0.745     Recent Results (from the past 240 hour(s))  Urine culture     Status: Abnormal  (Preliminary result)   Collection Time: 10/21/17  2:45 AM  Result Value Ref Range Status   Specimen Description URINE, CATHETERIZED  Final   Special Requests NONE  Final   Culture >=100,000 COLONIES/mL GRAM NEGATIVE RODS (A)  Final   Report Status PENDING  Incomplete      Radiology Studies: Dg Chest 2 View  Result Date: 10/21/2017 CLINICAL DATA:  Shortness of breath. Fall out of bed at nursing home. EXAM: CHEST  2 VIEW COMPARISON:  Chest radiograph 12/04/2016.  Chest CT 01/01/2016 FINDINGS: The cardiomediastinal contours are unchanged with aortic arch atherosclerosis. Bilateral calcified pleural plaques are unchanged from prior exam. Pulmonary vasculature is normal. No consolidation, pleural effusion, or pneumothorax. No acute osseous abnormalities are seen. IMPRESSION: 1. No acute abnormality. 2. Bilateral calcified pleural plaques, unchanged. Electronically Signed   By: Jeb Levering M.D.   On: 10/21/2017 01:31   Dg Thoracic Spine 2 View  Result Date: 10/21/2017 CLINICAL DATA:  Back pain EXAM: THORACIC SPINE 2 VIEWS COMPARISON:  10/21/2017 FINDINGS: There is a mild curvature of the thoracic spine which is convex towards the right. The vertebral body heights and disc spaces are well preserved. No fractures identified. Aortic atherosclerosis. Extensive bilateral calcified pleural plaques are noted. IMPRESSION: 1. Mild curvature of the thoracic spine. 2. No fractures noted. Electronically Signed   By: Kerby Moors M.D.   On: 10/21/2017 12:58   Dg Lumbar Spine 2-3 Views  Result Date: 10/21/2017 CLINICAL DATA:  Back pain. EXAM: LUMBAR SPINE - 2-3 VIEW COMPARISON:  12/04/2016. FINDINGS: Diffuse multilevel degenerative change. No acute bony abnormality identified. No evidence of fracture. Aortoiliac atherosclerotic vascular calcification. IMPRESSION: 1. Diffuse osteopenia and degenerative change. Mild scoliosis. No acute abnormality. 2. Aortoiliac atherosclerotic vascular disease.  Electronically Signed   By: Marcello Moores  Register   On: 10/21/2017 12:56   Dg Shoulder Right  Result Date: 10/21/2017 CLINICAL DATA:  Right shoulder pain after fall out of bed. EXAM: RIGHT SHOULDER - 2+ VIEW COMPARISON:  None. FINDINGS: There is no evidence of acute fracture or dislocation. Undersurface spurring at the [facility] joint consistent with osteoarthritis. Calcified pleural plaque is noted of the adjacent included lung. No adjacent rib fracture. There Soft tissues are unremarkable. IMPRESSION: 1. No acute fracture of the right shoulder. 2. No joint dislocations. 3. AC joint osteoarthritis. 4. Calcified pleural plaque consistent with prior asbestos exposure. Electronically Signed   By: Meredith Leeds.D.  On: 10/21/2017 00:27   Dg Tibia/fibula Right  Result Date: 10/21/2017 CLINICAL DATA:  Recent falls with lower leg pain, initial encounter EXAM: RIGHT TIBIA AND FIBULA - 2 VIEW COMPARISON:  10/20/2017 FINDINGS: Degenerative changes about the right knee joint are seen. No acute fracture or dislocation is noted. No soft tissue abnormality is seen. IMPRESSION: No acute abnormality noted. Electronically Signed   By: Inez Catalina M.D.   On: 10/21/2017 12:56   Ct Head Wo Contrast  Result Date: 10/21/2017 CLINICAL DATA:  Weakness. Patient fell and was found on floor. Head and neck pain. EXAM: CT HEAD WITHOUT CONTRAST CT CERVICAL SPINE WITHOUT CONTRAST TECHNIQUE: Multidetector CT imaging of the head and cervical spine was performed following the standard protocol without intravenous contrast. Multiplanar CT image reconstructions of the cervical spine were also generated. COMPARISON:  12/04/2016 FINDINGS: CT HEAD FINDINGS Brain: Mild superficial atrophy with chronic minimal small vessel ischemic disease. No large vascular territory infarct, hemorrhage midline shift. No hydrocephalus. No intra-axial mass nor extra-axial fluid collections. Vascular: No hyperdense vessels. Skull: No acute skull fracture or suspicious  osseous lesions. Sinuses/Orbits: No acute finding. Other: None. CT CERVICAL SPINE FINDINGS Alignment: Mild levoconvex curvature of the cervical spine. Maintained cervical lordosis. Skull base and vertebrae: No acute fracture. No primary bone lesion or focal pathologic process. Soft tissues and spinal canal: No prevertebral fluid or swelling. No visible canal hematoma. Disc levels: Moderate disc space narrowing C5-6 and C6-7 with small posterior marginal osteophytes and uncovertebral joint spurring. Mild C5-6 bilateral neural foraminal narrowing from osteophytes. Upper chest: No acute pulmonary abnormality. Other: Bilateral atherosclerotic calcifications of the extracranial carotid arteries. IMPRESSION: 1. Stable cerebral atrophy with minimal small vessel ischemic disease. No acute intracranial abnormality. 2. Cervical spondylosis with moderate disc space narrowing at C5-6 and C6-7 with associated degenerative uncovertebral joint osteoarthritis and endplate spurring. No acute osseous abnormality. Electronically Signed   By: Ashley Royalty M.D.   On: 10/21/2017 00:33   Ct Cervical Spine Wo Contrast  Result Date: 10/21/2017 CLINICAL DATA:  Weakness. Patient fell and was found on floor. Head and neck pain. EXAM: CT HEAD WITHOUT CONTRAST CT CERVICAL SPINE WITHOUT CONTRAST TECHNIQUE: Multidetector CT imaging of the head and cervical spine was performed following the standard protocol without intravenous contrast. Multiplanar CT image reconstructions of the cervical spine were also generated. COMPARISON:  12/04/2016 FINDINGS: CT HEAD FINDINGS Brain: Mild superficial atrophy with chronic minimal small vessel ischemic disease. No large vascular territory infarct, hemorrhage midline shift. No hydrocephalus. No intra-axial mass nor extra-axial fluid collections. Vascular: No hyperdense vessels. Skull: No acute skull fracture or suspicious osseous lesions. Sinuses/Orbits: No acute finding. Other: None. CT CERVICAL SPINE  FINDINGS Alignment: Mild levoconvex curvature of the cervical spine. Maintained cervical lordosis. Skull base and vertebrae: No acute fracture. No primary bone lesion or focal pathologic process. Soft tissues and spinal canal: No prevertebral fluid or swelling. No visible canal hematoma. Disc levels: Moderate disc space narrowing C5-6 and C6-7 with small posterior marginal osteophytes and uncovertebral joint spurring. Mild C5-6 bilateral neural foraminal narrowing from osteophytes. Upper chest: No acute pulmonary abnormality. Other: Bilateral atherosclerotic calcifications of the extracranial carotid arteries. IMPRESSION: 1. Stable cerebral atrophy with minimal small vessel ischemic disease. No acute intracranial abnormality. 2. Cervical spondylosis with moderate disc space narrowing at C5-6 and C6-7 with associated degenerative uncovertebral joint osteoarthritis and endplate spurring. No acute osseous abnormality. Electronically Signed   By: Ashley Royalty M.D.   On: 10/21/2017 00:33   Dg Knee Complete  4 Views Right  Result Date: 10/21/2017 CLINICAL DATA:  Pain and ecchymosis after fall. EXAM: RIGHT KNEE - COMPLETE 4+ VIEW COMPARISON:  10/19/2014 FINDINGS: Re- demonstration of tricompartmental osteoarthritis of the knee. Small suprapatellar joint effusion. No acute displaced fracture or joint dislocation. IMPRESSION: Tricompartmental osteoarthritis with small joint effusion. No acute osseous abnormality. Electronically Signed   By: Ashley Royalty M.D.   On: 10/21/2017 00:28   Dg Hip Unilat With Pelvis 2-3 Views Right  Result Date: 10/21/2017 CLINICAL DATA:  Status post fall at home 1 day ago with generalize right hip pain which increases with weight-bearing. EXAM: DG HIP (WITH OR WITHOUT PELVIS) 2-3V RIGHT COMPARISON:  Bilateral hip series dated December 04, 2016 FINDINGS: The bones are subjectively osteopenic. The bony pelvis exhibits no acute abnormality. AP and lateral views of the right hip reveal mild  symmetric joint space loss. The femoral head, neck, intertrochanteric, and immediate sub trochanteric regions are normal. IMPRESSION: There is no acute fracture nor dislocation of the right hip. There is mild symmetric hip joint space loss consistent with osteoarthritis. Electronically Signed   By: David  Martinique M.D.   On: 10/21/2017 16:56     Medications:  Scheduled: . ARIPiprazole  10 mg Oral Daily  . aspirin  81 mg Oral QHS  . DULoxetine  30 mg Oral BID  . enoxaparin (LOVENOX) injection  40 mg Subcutaneous Q24H  . furosemide  40 mg Oral Daily  . isosorbide mononitrate  30 mg Oral QHS  . isosorbide mononitrate  60 mg Oral Daily  . metoprolol succinate  25 mg Oral BID  . multivitamin with minerals  1 tablet Oral Daily  . pantoprazole  40 mg Oral Daily  . potassium chloride  40 mEq Oral Daily   And  . potassium chloride  20 mEq Oral QHS   Continuous: . cefTRIAXone (ROCEPHIN)  IV 1 g (10/22/17 0427)   ZOX:[id number] **OR** acetaminophen, albuterol, ALPRAZolam, diclofenac sodium, lip balm, nitroGLYCERIN, oxyCODONE-acetaminophen **AND** oxyCODONE, polyethylene glycol, traZODone  Assessment/Plan:  Principal Problem:   Altered mental status Active Problems:   Hypokalemia   UTI (urinary tract infection)   Fall    Questionable near syncope It remains unclear if the patient did experience loss of consciousness or not.  Evaluation here has been unremarkable including carotid Dopplers and echocardiogram.  No arrhythmias noted.  Mobilize.  Fall No obvious injuries identified.  X-rays do not show any fractures.  She has mobility of all her joints.  Lower extremity pain No edema is noted.  No erythema.  No obvious swelling.  Some of her pain characteristics are suggestive of neuropathic pain.  Hesitate starting new medication that the patient is already on multiple psychotropic meds.  PT evaluation.  They could also be an element of radiculopathic pain considering history of  degenerative disc disease.  Acute encephalopathy No abnormalities noted on imaging studies.  Could have been due to the UTI.  Improved.  No focal deficits.  Urinary tract infection Urine culture is growing gram-negative rods.  Await final identification and sensitivities.  Hypokalemia Will be repleted.  Magnesium was 1.9 yesterday  History of essential hypertension/chronic diastolic CHF Continue home medication  History of anxiety disorder Patient noted to be on multiple psychotropic medications.  Continue the same for now  DVT Prophylaxis: Lovenox    Code Status: Full code Family Communication: Discussed with the patient Disposition Plan: Management as outlined above.  Mobilize.    LOS: 1 day   [facility] Pager  [phone] 10/22/2017, 2:42 PM  If 7PM-7AM, please contact night-coverage at [email or web address], password [facility]

## 2017-10-22 NOTE — Care Management Note (Signed)
Case Management Note  Patient Details  Name: Linda [name] MRN: [id number] Date of Birth: [birth year]  Subjective/Objective:                  fall  Action/Plan: Date:  October 22 2017 Chart reviewed for concurrent status and case management needs.  Will continue to follow patient progress.  Discharge Planning: following for needs  Expected discharge date: October 25, 2017  Velva Harman, BSN, [place], [place]   Expected Discharge Date:   (UNKNOWN)               Expected Discharge Plan:  Home/Self Care  In-House Referral:     Discharge planning Services  CM Consult  Post Acute Care Choice:    Choice offered to:     DME Arranged:    DME Agency:     HH Arranged:    [place] Agency:     Status of Service:  In process, will continue to follow  If discussed at Long Length of Stay Meetings, dates discussed:    Additional Comments:  Leeroy Cha, RN 10/22/2017, 9:43 AM

## 2017-10-22 NOTE — Evaluation (Signed)
Physical Therapy Evaluation Patient Details Name: Linda [name] MRN: [id number] DOB: 01-Sep-1946 Today's Date: 10/22/2017   History of Present Illness  71 yo female admited AMS, multiple falls at home (-) fx. Hx of BPPV, COPD-O2 dep, CAD.   Clinical Impression  On eval, pt required Min assist for mobility. She was able to stand and pivot to the recliner with a RW. Pt c/o moderate pain with activity. Remained on [place] O2 during session. Discussed d/c plan-pt prefers to return to her Ind Living facility. At this time, recommendation is for ST rehab at SNF if pt will agree. Will follow and progress activity as tolerated.     Follow Up Recommendations SNF (if pt will agree. If she declines SNF, recommend HHPT and 24 hour supervision/assist)    Equipment Recommendations  None recommended by PT    Recommendations for Other Services       Precautions / Restrictions Precautions Precautions: Fall Precaution Comments: O2 dep Restrictions Weight Bearing Restrictions: No      Mobility  Bed Mobility Overal bed mobility: Needs Assistance Bed Mobility: Supine to Sit     Supine to sit: Supervision     General bed mobility comments: for safety. increased time.  Transfers Overall transfer level: Needs assistance Equipment used: Rolling walker (2 wheeled) Transfers: Sit to/from Stand Sit to Stand: Min assist Stand pivot transfers: Min assist       General transfer comment: Assist to rise, stabilize, control descent. Stand pivot, bed to recliner, with RW. Increased time.   Ambulation/Gait             General Gait Details: NT  Stairs            Wheelchair Mobility    Modified Rankin (Stroke Patients Only)       Balance Overall balance assessment: Needs assistance;History of Falls         Standing balance support: Bilateral upper extremity supported Standing balance-Leahy Scale: Poor Standing balance comment: requires RW                              Pertinent Vitals/Pain Pain Assessment: Faces Faces Pain Scale: Hurts even more Pain Location: R LE, bil knees Pain Descriptors / Indicators: Sore;Aching Pain Intervention(s): Limited activity within patient's tolerance;Repositioned    Home Living Family/patient expects to be discharged to:: Private residence Living Arrangements: Alone   Type of Home: Independent living facility Home Access: Level entry     Home Layout: One level Home Equipment: Environmental consultant - 4 wheels Additional Comments: Uses rollator for ambulation; walks down long hall to elevator, down to first floor and down another hallway to get to dining room. States she only does this for dinner usually    Prior Function Level of Independence: Independent with assistive device(s)         Comments: per pt     Hand Dominance        Extremity/Trunk Assessment   Upper Extremity Assessment Upper Extremity Assessment: Generalized weakness    Lower Extremity Assessment Lower Extremity Assessment: Generalized weakness    Cervical / Trunk Assessment Cervical / Trunk Assessment: Normal  Communication   Communication: No difficulties  Cognition Arousal/Alertness: Awake/alert Behavior During Therapy: WFL for tasks assessed/performed Overall Cognitive Status: Within Functional Limits for tasks assessed  General Comments: some minor memory issues noted today      General Comments      Exercises     Assessment/Plan    PT Assessment Patient needs continued PT services  PT Problem List Decreased strength;Decreased mobility;Decreased activity tolerance;Decreased balance;Decreased knowledge of use of DME;Pain       PT Treatment Interventions DME instruction;Therapeutic activities;Therapeutic exercise;Patient/family education;Gait training;Balance training;Functional mobility training    PT Goals (Current goals can be found in the Care Plan section)  Acute Rehab  PT Goals Patient Stated Goal: return home to [facility] PT Goal Formulation: With patient Time For Goal Achievement: 11/05/17 Potential to Achieve Goals: Good    Frequency Min 3X/week   Barriers to discharge        Co-evaluation   Reason for Co-Treatment: For patient/therapist safety PT goals addressed during session: Mobility/safety with mobility OT goals addressed during session: ADL's and self-care       AM-PAC PT "6 Clicks" Daily Activity  Outcome Measure Difficulty turning over in bed (including adjusting bedclothes, sheets and blankets)?: A Little Difficulty moving from lying on back to sitting on the side of the bed? : A Little Difficulty sitting down on and standing up from a chair with arms (e.g., wheelchair, bedside commode, etc,.)?: Unable Help needed moving to and from a bed to chair (including a wheelchair)?: A Little Help needed walking in hospital room?: A Little Help needed climbing 3-5 steps with a railing? : A Little 6 Click Score: 16    End of Session Equipment Utilized During Treatment: Gait belt;Oxygen Activity Tolerance: Patient limited by fatigue;Patient limited by pain Patient left: in chair;with call bell/phone within reach   PT Visit Diagnosis: Muscle weakness (generalized) (M62.81);Difficulty in walking, not elsewhere classified (R26.2)    Time: 1025-8527 PT Time Calculation (min) (ACUTE ONLY): 25 min   Charges:   PT Evaluation $PT Eval Moderate Complexity: 1 Mod     PT G Codes:          Weston Anna, MPT Pager: [phone]

## 2017-10-22 NOTE — Evaluation (Signed)
Occupational Therapy Evaluation Patient Details Name: Linda [name] MRN: [id number] DOB: 1946-03-12 Today's Date: 10/22/2017    History of Present Illness pt was admitted with AMS.  She sustained a fall without fxs. PMH:  BPPV and COPD, on 3 liters 02 at baseline, and CAD   Clinical Impression   This 71 year old female was admitted for the above.  She reports that she is mod I for adls and uses rollator at her independent living.  Pt did not c/o dizziness during evaluation. She needs min A for sit to stand and tolerated getting up to chair. Goals in acute are for supervision level    Follow Up Recommendations  SNF (vs hired assistance and HHOT; pt doesn't want SNF)    Equipment Recommendations  None recommended by OT    Recommendations for Other Services       Precautions / Restrictions Precautions Precautions: Fall Precaution Comments: pt states she could not put weight on RLE prior to fall:  Tolerated this session. Noted pt was not orthostatic Restrictions Weight Bearing Restrictions: No      Mobility Bed Mobility Overal bed mobility: Modified Independent             General bed mobility comments: extra time  Transfers Overall transfer level: Needs assistance Equipment used: Rolling walker (2 wheeled) Transfers: Sit to/from [facility] Sit to Stand: Min assist Stand pivot transfers: Min guard       General transfer comment: light assistance to stand and steady.  Min guard for safety during transfer    Balance                                           ADL either performed or assessed with clinical judgement   ADL Overall ADL's : Needs assistance/impaired Eating/Feeding: Independent   Grooming: Set up;Sitting   Upper Body Bathing: Set up;Sitting   Lower Body Bathing: Minimal assistance;Sit to/from stand   Upper Body Dressing : Set up;Sitting   Lower Body Dressing: Minimal assistance;Sit to/from stand    Toilet Transfer: Minimal assistance;Stand-pivot;RW (chair)   Toileting- Clothing Manipulation and Hygiene: Minimal assistance;Sit to/from stand         General ADL Comments: pt was able to reach to adjust sock in the bed. Min A for sit to stand for adls.  Pt states she struggles every day with adls but gets it done.  Offered to show her AE to make it easier, but she wasn't interested at this time     Vision         Perception     Praxis      Pertinent Vitals/Pain Pain Assessment: Faces Faces Pain Scale: Hurts little more Pain Location: RLE, bil knees Pain Descriptors / Indicators: Aching Pain Intervention(s): Limited activity within patient's tolerance;Monitored during session;Repositioned     Hand Dominance     Extremity/Trunk Assessment Upper Extremity Assessment Upper Extremity Assessment: Generalized weakness           Communication Communication Communication: No difficulties   Cognition Arousal/Alertness: Awake/alert Behavior During Therapy: WFL for tasks assessed/performed                                   General Comments: decreased memory   General Comments       Exercises  Shoulder Instructions      Home Living Family/patient expects to be discharged to::  (independent living; [facility])                                 Additional Comments: Uses rollator for ambulation; walks down long hall to elevator, down to first floor and down another hallway to get to dining room. States she only does this for dinner usually      Prior Functioning/Environment Level of Independence: Independent with assistive device(s)        Comments: per pt        OT Problem List: Decreased strength;Decreased activity tolerance;Impaired balance (sitting and/or standing);Decreased knowledge of use of DME or AE;Pain      OT Treatment/Interventions: Self-care/ADL training;Energy conservation;DME and/or AE instruction;Balance  training;Patient/family education;Therapeutic activities    OT Goals(Current goals can be found in the care plan section) Acute Rehab OT Goals Patient Stated Goal: return home to [facility] OT Goal Formulation: With patient Time For Goal Achievement: 10/29/17 Potential to Achieve Goals: Good ADL Goals Pt Will Transfer to Toilet: with supervision;ambulating;grab bars (high commode) Pt Will Perform Toileting - Clothing Manipulation and hygiene: with supervision;sit to/from stand Additional ADL Goal #1: pt will gather clothes and perform ADL with supervision  OT Frequency: Min 2X/week   Barriers to D/C:            Co-evaluation PT/OT/SLP Co-Evaluation/Treatment: Yes Reason for Co-Treatment: For patient/therapist safety PT goals addressed during session: Mobility/safety with mobility OT goals addressed during session: ADL's and self-care      AM-PAC PT "6 Clicks" Daily Activity     Outcome Measure Help from another person eating meals?: None Help from another person taking care of personal grooming?: A Little Help from another person toileting, which includes using toliet, bedpan, or urinal?: A Little Help from another person bathing (including washing, rinsing, drying)?: A Little Help from another person to put on and taking off regular upper body clothing?: A Little Help from another person to put on and taking off regular lower body clothing?: A Little 6 Click Score: 19   End of Session Nurse Communication:  (needs alarm pad and clean linen)  Activity Tolerance: Patient tolerated treatment well Patient left: in chair;with call bell/phone within reach  OT Visit Diagnosis: Pain Pain - Right/Left: Right Pain - part of body: Leg                Time: 4765-4650 OT Time Calculation (min): 25 min Charges:  OT General Charges $OT Visit: 1 Visit OT Evaluation $OT Eval Low Complexity: 1 Low G-Codes:     [place],  OTR/L [phone] 10/22/2017  Makenna Macaluso 10/22/2017, 2:09 PM

## 2017-10-23 LAB — BASIC METABOLIC PANEL
Anion gap: 10 (ref 5–15)
BUN: 19 mg/dL (ref 6–20)
CALCIUM: 8.5 mg/dL — AB (ref 8.9–10.3)
CO2: 28 mmol/L (ref 22–32)
CREATININE: 0.74 mg/dL (ref 0.44–1.00)
Chloride: 101 mmol/L (ref 101–111)
GFR calc Af Amer: >60 mL/min (ref >60)
GLUCOSE: 104 mg/dL — AB (ref 65–99)
Potassium: 3.8 mmol/L (ref 3.5–5.1)
Sodium: 139 mmol/L (ref 135–145)

## 2017-10-23 LAB — MAGNESIUM: Magnesium: 2 mg/dL (ref 1.7–2.4)

## 2017-10-23 MED ORDER — CIPROFLOXACIN HCL 500 MG PO TABS: 500.0000 mg | ORAL_TABLET | Freq: Two times a day (BID) | ORAL | 0 refills | Status: AC

## 2017-10-23 MED ORDER — CIPROFLOXACIN HCL 500 MG PO TABS
500.0000 mg | ORAL_TABLET | Freq: Two times a day (BID) | ORAL | Status: DC
  Administered 2017-10-23: 500 mg via ORAL
  Filled 2017-10-23: qty 1

## 2017-10-23 NOTE — Discharge Summary (Addendum)
[facility]  Physician Discharge Summary   Patient ID: Linda [name] MRN: [id number] DOB/AGE: 1946-01-17 71 y.o.  Admit date: 10/20/2017 Discharge date: 10/23/2017  PCP: Tammi Sou, MD  DISCHARGE DIAGNOSES:  Principal Problem:   Altered mental status Active Problems:   Hypokalemia   UTI (urinary tract infection)   Fall   RECOMMENDATIONS FOR OUTPATIENT FOLLOW UP: 1. Patient to return to independent living facility with home health to be provided by [facility] 2. Follow-up with PCP in 1-2 weeks. 3. May need further evaluation for abnormal carotid Doppler study as outpatient.    DISCHARGE CONDITION: fair  Diet recommendation: Heart healthy  Filed Weights   10/21/17 0057  Weight: 68.9 kg (152 lb)    INITIAL HISTORY: 71 y.o.femalewith a hx of BPPV, anxiety and depression,diastolic CHF,asbestosis, COPD on O2 3 lpm, HTN, moderateCAD by cath 2014 -med rx, fibromyalgia w/ chronic pain issues on oxycodone, who apparently presented to ED after 2 falls.  There was some concern for syncope although it is not entirely clear if she did pass out or not.  Patient uses a wheelchair only rarely.  Typically walks using a walker or cane.  Consultations:  None  Procedures: Transthoracic echocardiogram Study Conclusions  - Left ventricle: The cavity size was normal. Systolic function was vigorous. The estimated ejection fraction was in the range of 65% to 70%. Wall motion was normal; there were no regional wall motion abnormalities. Doppler parameters are consistent with abnormal left ventricular relaxation (grade 1 diastolic dysfunction).  Carotid Doppler Final Interpretation: Right Carotid: There is evidence in the right ICA of a 1-39% stenosis. Left Carotid: There is evidence in the left ICA of a 1-39% stenosis. Vertebrals: Both vertebral arteries were patent with antegrade flow. Highresistant flow in the right vertebral artery may suggest  distalobstruction.   HOSPITAL COURSE:   Questionable near syncope It remains unclear if the patient did experience loss of consciousness or not.  Patient is not certain. Evaluation here has been unremarkable including echocardiogram.  No arrhythmias noted.  Carotid Doppler shows only minimal disease in both the carotid arteries.  Vertebral arteries also had antegrade flow however there was high resistance noted in the right vertebral artery.  Patient does not have any neurological deficits.  This will have to be pursued as an outpatient..  Fall No obvious injuries identified.  X-rays do not show any fractures.  She has mobility of all her joints.  Seen by physical therapy.  They recommend skilled nursing facility for short-term rehab versus home health.  Patient chooses home health.  This will be arranged at her facility.  Lower extremity pain No edema is noted.  No erythema.  No obvious swelling.  Some of her pain characteristics are suggestive of neuropathic pain.  Hesitate starting new medication that the patient is already on multiple psychotropic meds.  There could also be an element of radiculopathic pain considering history of degenerative disc disease.  Symptoms have improved.  She is able to ambulate with a walker.  Home health will be ordered as discussed above.  Outpatient follow-up with PCP.  Acute metabolic encephalopathy secondary to UTI No abnormalities noted on imaging studies.  Could have been due to the UTI.  Appears to be back to baseline.  Urinary tract infection Urine culture is growing Citrobacter.  Sensitivities reviewed.  Patient has tolerated ciprofloxacin previously.  Will be discharged on the same.    Hypokalemia This was repleted.  History of essential hypertension/chronic diastolic CHF Stable.  Continue  home medications.  History of anxiety disorder Patient noted to be on multiple psychotropic medications.  Continue the same for now.  Overall stable.   Okay for discharge back to her independent living facility with home health.    PERTINENT LABS:  The results of significant diagnostics from this hospitalization (including imaging, microbiology, ancillary and laboratory) are listed below for reference.    Microbiology: Recent Results (from the past 240 hour(s))  Urine culture     Status: Abnormal (Preliminary result)   Collection Time: 10/21/17  2:45 AM  Result Value Ref Range Status   Specimen Description URINE, CATHETERIZED  Final   Special Requests NONE  Final   Culture (A)  Final    >=100,000 COLONIES/mL CITROBACTER FREUNDII CULTURE REINCUBATED FOR BETTER GROWTH Performed at [facility], [address] [place], [place] [address]    Report Status PENDING  Incomplete   Organism ID, Bacteria CITROBACTER FREUNDII (A)  Final      Susceptibility   Citrobacter freundii - MIC*    CEFAZOLIN RESISTANT Resistant     CEFTRIAXONE <=1 SENSITIVE Sensitive     CIPROFLOXACIN <=0.25 SENSITIVE Sensitive     GENTAMICIN <=1 SENSITIVE Sensitive     IMIPENEM <=0.25 SENSITIVE Sensitive     NITROFURANTOIN 64 INTERMEDIATE Intermediate     TRIMETH/SULFA <=20 SENSITIVE Sensitive     PIP/TAZO <=4 SENSITIVE Sensitive     * >=100,000 COLONIES/mL CITROBACTER FREUNDII     Labs: Basic Metabolic Panel:  Recent Labs Lab 10/21/17 0058 10/21/17 0620 10/21/17 1439 10/22/17 0607 10/23/17 0547  NA 137 141  --  140 139  K 3.2* 3.1*  --  3.3* 3.8  CL 100* 105  --  102 101  CO2 27 28  --  28 28  GLUCOSE 106* 109*  --  107* 104*  BUN 12 11  --  12 19  CREATININE 0.73 0.73  --  0.76 0.74  CALCIUM 8.8* 8.6*  --  8.5* 8.5*  MG  --   --  1.9  --  2.0   Liver Function Tests:  Recent Labs Lab 10/21/17 0058 10/21/17 0620  AST 42* 46*  ALT 19 18  ALKPHOS 59 53  BILITOT 0.8 0.8  PROT 7.1 6.5  ALBUMIN 3.9 3.5    Recent Labs Lab 10/21/17 0058  LIPASE 22   CBC:  Recent Labs Lab 10/21/17 0058 10/21/17 0620 10/22/17 0607  WBC 11.2*  7.5 6.3  NEUTROABS 8.7*  --   --   HGB 13.8 12.8 12.4  HCT 40.9 38.1 38.8  MCV 89.1 89.0 90.7  PLT 172 168 185   Cardiac Enzymes:  Recent Labs Lab 10/21/17 0620 10/21/17 1439 10/21/17 2010  TROPONINI <0.03 <0.03 <0.03   BNP: BNP (last 3 results)  Recent Labs  10/21/17 0058  BNP 34.5    CBG:  Recent Labs Lab 10/21/17 0205  GLUCAP 81     IMAGING STUDIES Dg Chest 2 View  Result Date: 10/21/2017 CLINICAL DATA:  Shortness of breath. Fall out of bed at nursing home. EXAM: CHEST  2 VIEW COMPARISON:  Chest radiograph 12/04/2016.  Chest CT 01/01/2016 FINDINGS: The cardiomediastinal contours are unchanged with aortic arch atherosclerosis. Bilateral calcified pleural plaques are unchanged from prior exam. Pulmonary vasculature is normal. No consolidation, pleural effusion, or pneumothorax. No acute osseous abnormalities are seen. IMPRESSION: 1. No acute abnormality. 2. Bilateral calcified pleural plaques, unchanged. Electronically Signed   By: Jeb Levering M.D.   On: 10/21/2017 01:31  Dg Thoracic Spine 2 View  Result Date: 10/21/2017 CLINICAL DATA:  Back pain EXAM: THORACIC SPINE 2 VIEWS COMPARISON:  10/21/2017 FINDINGS: There is a mild curvature of the thoracic spine which is convex towards the right. The vertebral body heights and disc spaces are well preserved. No fractures identified. Aortic atherosclerosis. Extensive bilateral calcified pleural plaques are noted. IMPRESSION: 1. Mild curvature of the thoracic spine. 2. No fractures noted. Electronically Signed   By: Kerby Moors M.D.   On: 10/21/2017 12:58   Dg Lumbar Spine 2-3 Views  Result Date: 10/21/2017 CLINICAL DATA:  Back pain. EXAM: LUMBAR SPINE - 2-3 VIEW COMPARISON:  12/04/2016. FINDINGS: Diffuse multilevel degenerative change. No acute bony abnormality identified. No evidence of fracture. Aortoiliac atherosclerotic vascular calcification. IMPRESSION: 1. Diffuse osteopenia and degenerative change. Mild  scoliosis. No acute abnormality. 2. Aortoiliac atherosclerotic vascular disease. Electronically Signed   By: Marcello Moores  Register   On: 10/21/2017 12:56   Dg Shoulder Right  Result Date: 10/21/2017 CLINICAL DATA:  Right shoulder pain after fall out of bed. EXAM: RIGHT SHOULDER - 2+ VIEW COMPARISON:  None. FINDINGS: There is no evidence of acute fracture or dislocation. Undersurface spurring at the [facility] joint consistent with osteoarthritis. Calcified pleural plaque is noted of the adjacent included lung. No adjacent rib fracture. There Soft tissues are unremarkable. IMPRESSION: 1. No acute fracture of the right shoulder. 2. No joint dislocations. 3. AC joint osteoarthritis. 4. Calcified pleural plaque consistent with prior asbestos exposure. Electronically Signed   By: Ashley Royalty M.D.   On: 10/21/2017 00:27   Dg Tibia/fibula Right  Result Date: 10/21/2017 CLINICAL DATA:  Recent falls with lower leg pain, initial encounter EXAM: RIGHT TIBIA AND FIBULA - 2 VIEW COMPARISON:  10/20/2017 FINDINGS: Degenerative changes about the right knee joint are seen. No acute fracture or dislocation is noted. No soft tissue abnormality is seen. IMPRESSION: No acute abnormality noted. Electronically Signed   By: Inez Catalina M.D.   On: 10/21/2017 12:56   Ct Head Wo Contrast  Result Date: 10/21/2017 CLINICAL DATA:  Weakness. Patient fell and was found on floor. Head and neck pain. EXAM: CT HEAD WITHOUT CONTRAST CT CERVICAL SPINE WITHOUT CONTRAST TECHNIQUE: Multidetector CT imaging of the head and cervical spine was performed following the standard protocol without intravenous contrast. Multiplanar CT image reconstructions of the cervical spine were also generated. COMPARISON:  12/04/2016 FINDINGS: CT HEAD FINDINGS Brain: Mild superficial atrophy with chronic minimal small vessel ischemic disease. No large vascular territory infarct, hemorrhage midline shift. No hydrocephalus. No intra-axial mass nor extra-axial fluid  collections. Vascular: No hyperdense vessels. Skull: No acute skull fracture or suspicious osseous lesions. Sinuses/Orbits: No acute finding. Other: None. CT CERVICAL SPINE FINDINGS Alignment: Mild levoconvex curvature of the cervical spine. Maintained cervical lordosis. Skull base and vertebrae: No acute fracture. No primary bone lesion or focal pathologic process. Soft tissues and spinal canal: No prevertebral fluid or swelling. No visible canal hematoma. Disc levels: Moderate disc space narrowing C5-6 and C6-7 with small posterior marginal osteophytes and uncovertebral joint spurring. Mild C5-6 bilateral neural foraminal narrowing from osteophytes. Upper chest: No acute pulmonary abnormality. Other: Bilateral atherosclerotic calcifications of the extracranial carotid arteries. IMPRESSION: 1. Stable cerebral atrophy with minimal small vessel ischemic disease. No acute intracranial abnormality. 2. Cervical spondylosis with moderate disc space narrowing at C5-6 and C6-7 with associated degenerative uncovertebral joint osteoarthritis and endplate spurring. No acute osseous abnormality. Electronically Signed   By: Ashley Royalty M.D.   On: 10/21/2017 00:33  Ct Cervical Spine Wo Contrast  Result Date: 10/21/2017 CLINICAL DATA:  Weakness. Patient fell and was found on floor. Head and neck pain. EXAM: CT HEAD WITHOUT CONTRAST CT CERVICAL SPINE WITHOUT CONTRAST TECHNIQUE: Multidetector CT imaging of the head and cervical spine was performed following the standard protocol without intravenous contrast. Multiplanar CT image reconstructions of the cervical spine were also generated. COMPARISON:  12/04/2016 FINDINGS: CT HEAD FINDINGS Brain: Mild superficial atrophy with chronic minimal small vessel ischemic disease. No large vascular territory infarct, hemorrhage midline shift. No hydrocephalus. No intra-axial mass nor extra-axial fluid collections. Vascular: No hyperdense vessels. Skull: No acute skull fracture or  suspicious osseous lesions. Sinuses/Orbits: No acute finding. Other: None. CT CERVICAL SPINE FINDINGS Alignment: Mild levoconvex curvature of the cervical spine. Maintained cervical lordosis. Skull base and vertebrae: No acute fracture. No primary bone lesion or focal pathologic process. Soft tissues and spinal canal: No prevertebral fluid or swelling. No visible canal hematoma. Disc levels: Moderate disc space narrowing C5-6 and C6-7 with small posterior marginal osteophytes and uncovertebral joint spurring. Mild C5-6 bilateral neural foraminal narrowing from osteophytes. Upper chest: No acute pulmonary abnormality. Other: Bilateral atherosclerotic calcifications of the extracranial carotid arteries. IMPRESSION: 1. Stable cerebral atrophy with minimal small vessel ischemic disease. No acute intracranial abnormality. 2. Cervical spondylosis with moderate disc space narrowing at C5-6 and C6-7 with associated degenerative uncovertebral joint osteoarthritis and endplate spurring. No acute osseous abnormality. Electronically Signed   By: Ashley Royalty M.D.   On: 10/21/2017 00:33   Dg Knee Complete 4 Views Right  Result Date: 10/21/2017 CLINICAL DATA:  Pain and ecchymosis after fall. EXAM: RIGHT KNEE - COMPLETE 4+ VIEW COMPARISON:  10/19/2014 FINDINGS: Re- demonstration of tricompartmental osteoarthritis of the knee. Small suprapatellar joint effusion. No acute displaced fracture or joint dislocation. IMPRESSION: Tricompartmental osteoarthritis with small joint effusion. No acute osseous abnormality. Electronically Signed   By: Ashley Royalty M.D.   On: 10/21/2017 00:28   Dg Hip Unilat With Pelvis 2-3 Views Right  Result Date: 10/21/2017 CLINICAL DATA:  Status post fall at home 1 day ago with generalize right hip pain which increases with weight-bearing. EXAM: DG HIP (WITH OR WITHOUT PELVIS) 2-3V RIGHT COMPARISON:  Bilateral hip series dated December 04, 2016 FINDINGS: The bones are subjectively osteopenic. The bony  pelvis exhibits no acute abnormality. AP and lateral views of the right hip reveal mild symmetric joint space loss. The femoral head, neck, intertrochanteric, and immediate sub trochanteric regions are normal. IMPRESSION: There is no acute fracture nor dislocation of the right hip. There is mild symmetric hip joint space loss consistent with osteoarthritis. Electronically Signed   By: David  Martinique M.D.   On: 10/21/2017 16:56    DISCHARGE EXAMINATION: Vitals:   10/22/17 1420 10/22/17 2045 10/23/17 0508 10/23/17 0700  BP: 129/64 118/71 (!) 109/58 123/62  Pulse: 81 76 70 71  Resp:  18 18   Temp: 98.1 F (36.7 C) 98.1 F (36.7 C) (!) 97.5 F (36.4 C) 97.8 F (36.6 C)  TempSrc: Oral Oral Oral Oral  SpO2: 99% 99% 98% 98%  Weight:      Height:       General appearance: alert, cooperative, appears stated age and no distress Resp: clear to auscultation bilaterally Cardio: regular rate and rhythm, S1, S2 normal, no murmur, click, rub or gallop GI: soft, non-tender; bowel sounds normal; no masses,  no organomegaly Extremities: extremities normal, atraumatic, no cyanosis or edema Neurologic: Alert and oriented X 3, normal strength and tone.  Normal symmetric reflexes. Normal coordination and gait  DISPOSITION: Independent living facility home health provided by [facility]  Discharge Instructions    Call MD for:  extreme fatigue    Complete by:  As directed    Call MD for:  persistant dizziness or light-headedness    Complete by:  As directed    Call MD for:  persistant nausea and vomiting    Complete by:  As directed    Call MD for:  severe uncontrolled pain    Complete by:  As directed    Call MD for:  temperature >100.4    Complete by:  As directed    Discharge instructions    Complete by:  As directed    Please review instructions on the discharge summary.  You were cared for by a hospitalist during your hospital stay. If you have any questions about your discharge medications or the  care you received while you were in the hospital after you are discharged, you can call the unit and asked to speak with the hospitalist on call if the hospitalist that took care of you is not available. Once you are discharged, your primary care physician will handle any further medical issues. Please note that NO REFILLS for any discharge medications will be authorized once you are discharged, as it is imperative that you return to your primary care physician (or establish a relationship with a primary care physician if you do not have one) for your aftercare needs so that they can reassess your need for medications and monitor your lab values. If you do not have a primary care physician, you can call [phone] for a physician referral.   Increase activity slowly    Complete by:  As directed       ALLERGIES:  Allergies  Allergen Reactions  . Penicillins Itching, Swelling and Rash    Tolerated Cefepime in ED. Has patient had a PCN reaction causing immediate rash, facial/tongue/throat swelling, SOB or lightheadedness with hypotension: Yes Has patient had a PCN reaction causing severe rash involving mucus membranes or skin necrosis: No Has patient had a PCN reaction that required hospitalization: No  Has patient had a PCN reaction occurring within the last 10 years: No      Current Discharge Medication List    START taking these medications   Details  ciprofloxacin (CIPRO) 500 MG tablet Take 1 tablet (500 mg total) by mouth 2 (two) times daily. Qty: 10 tablet, Refills: 0      CONTINUE these medications which have NOT CHANGED   Details  albuterol (PROVENTIL HFA;VENTOLIN HFA) 108 (90 BASE) MCG/ACT inhaler Inhale 2 puffs into the lungs every 6 (six) hours as needed for wheezing or shortness of breath. Qty: 1 Inhaler, Refills: 1   Associated Diagnoses: Left leg swelling    alendronate (FOSAMAX) 70 MG tablet Take 1 tablet (70 mg total) by mouth every 7 (seven) days. Take with a full glass  of water on an empty stomach, first thing in the morning and remain up right for 30 minutes after taking. Qty: 12 tablet, Refills: 3    ALPRAZolam (XANAX) 1 MG tablet TAKE 1 TABLET THREE TIMES DAILY AS NEEDED FOR ANXIETY. Qty: 90 tablet, Refills: 5    ARIPiprazole (ABILIFY) 10 MG tablet TAKE 1 TABLET ONCE DAILY. Qty: 90 tablet, Refills: 3    aspirin 81 MG tablet Take 81 mg by mouth at bedtime.    diclofenac sodium (VOLTAREN) 1 % GEL Apply 2 g topically 3 (  three) times daily as needed (pain). Qty: 2 Tube, Refills: 2    !! DULoxetine (CYMBALTA) 30 MG capsule TAKE 1 CAPSULE ONCE DAILY ALONG WITH 60MG  CAPSULE FOR TOTAL 90MG  DAILY. Qty: 30 capsule, Refills: 1    !! DULoxetine (CYMBALTA) 60 MG capsule TAKE 1 CAPSULE A DAY TO BE COMBINED WITH 30MG  CAPSULE Qty: 90 capsule, Refills: 1    furosemide (LASIX) 40 MG tablet Take 1 tablet (40 mg total) by mouth daily. Qty: 60 tablet, Refills: 11    isosorbide mononitrate (IMDUR) 30 MG 24 hr tablet Take 1 tablet (30 mg total) by mouth 2 (two) times daily. 60mg  in the am and 30mg  in the pm Qty: 90 tablet, Refills: 3    metoprolol succinate (TOPROL XL) 25 MG 24 hr tablet Take 1 tablet (25 mg total) by mouth 2 (two) times daily. Qty: 60 tablet, Refills: 11    Multiple Vitamin (MULTIVITAMIN WITH MINERALS) TABS tablet Take 1 tablet by mouth daily.    nitroGLYCERIN (NITROSTAT) 0.4 MG SL tablet Place 1 tablet (0.4 mg total) under the tongue every 5 (five) minutes as needed for chest pain (x 3 doses). Qty: 25 tablet, Refills: 3    oxyCODONE-acetaminophen (PERCOCET) 10-325 MG tablet Take 1 tablet by mouth every 6 (six) hours as needed for pain. Qty: 100 tablet, Refills: 0   Associated Diagnoses: Primary osteoarthritis of left knee; Fibromyalgia syndrome    pantoprazole (PROTONIX) 40 MG tablet TAKE 1 TABLET DAILY. Qty: 90 tablet, Refills: 3    polyethylene glycol (MIRALAX / GLYCOLAX) packet Take 17 g by mouth daily as needed (constipation). Mix in 8  oz liquid and drink    potassium chloride SA (K-DUR,KLOR-CON) 20 MEQ tablet Take 20 mEq by mouth as directed. Take 2 tablets by mouth in the morning and tablet in the evening    traZODone (DESYREL) 50 MG tablet TAKE 2-4 TABLETS AT BEDTIME Qty: 120 tablet, Refills: 6    Vitamin D, Ergocalciferol, (DRISDOL) 50000 units CAPS capsule 1 tab po TWICE per week Qty: 24 capsule, Refills: 3    OXYGEN Inhale 3 L into the lungs continuous.     !! - Potential duplicate medications found. Please discuss with provider.       Follow-up Information    McGowen, Adrian Blackwater, MD. Schedule an appointment as soon as possible for a visit in 1 week(s).   Specialty:  Family Medicine Contact information: [address] [place] [address] [phone]           TOTAL DISCHARGE TIME: 63 minutes  [facility] Pager [phone]  10/23/2017, 12:38 PM

## 2017-10-23 NOTE — Progress Notes (Signed)
Occupational Therapy Treatment Patient Details Name: IVY PURYEAR MRN: [id number] DOB: 1946-01-03 Today's Date: 10/23/2017    History of present illness 71 yo female admited AMS, multiple falls at home (-) fx. Hx of BPPV, COPD-O2 dep, CAD.    OT comments  Pt progressing; min guard for spt today. She had already completed adl and plans to hire assistance at [facility].  Educated on AE for adls. Tolerating weight on RLE, although she still has pain  Follow Up Recommendations  Home health OT (pt plans to hire assistance)  Would benefit from SNF, but pt is not agreeable to this   Equipment Recommendations       Recommendations for Other Services      Precautions / Restrictions Precautions Precautions: Fall Precaution Comments: O2 dep Restrictions Weight Bearing Restrictions: No       Mobility Bed Mobility         Supine to sit: Supervision     General bed mobility comments: extra time  Transfers   Equipment used: Rolling walker (2 wheeled)   Sit to Stand: Min guard Stand pivot transfers: Min guard       General transfer comment: for safety    Balance                                           ADL either performed or assessed with clinical judgement   ADL                           Toilet Transfer: Min guard;Stand-pivot;BSC;RW             General ADL Comments: pt had completed adl. She said she is going to work on someone coming to help her for adls.  Demonstrated sock aide for her. She has a Secondary school teacher, and educated her on use of this for adls.       Vision       Perception     Praxis      Cognition Arousal/Alertness: Awake/alert Behavior During Therapy: WFL for tasks assessed/performed Overall Cognitive Status: Within Functional Limits for tasks assessed                                          Exercises     Shoulder Instructions       General Comments      Pertinent Vitals/  Pain       Faces Pain Scale: Hurts little more Pain Location: R LE, bil knees Pain Descriptors / Indicators: Sore Pain Intervention(s): Limited activity within patient's tolerance;Monitored during session;Premedicated before session;Repositioned  Home Living                                          Prior Functioning/Environment              Frequency           Progress Toward Goals  OT Goals(current goals can now be found in the care plan section)  Progress towards OT goals: Progressing toward goals     Plan      Co-evaluation  AM-PAC PT "6 Clicks" Daily Activity     Outcome Measure   Help from another person eating meals?: None Help from another person taking care of personal grooming?: A Little Help from another person toileting, which includes using toliet, bedpan, or urinal?: A Little Help from another person bathing (including washing, rinsing, drying)?: A Little Help from another person to put on and taking off regular upper body clothing?: A Little Help from another person to put on and taking off regular lower body clothing?: A Little 6 Click Score: 19    End of Session    OT Visit Diagnosis: Pain Pain - Right/Left: Right Pain - part of body: Leg   Activity Tolerance Patient tolerated treatment well   Patient Left in chair;with call bell/phone within reach   Nurse Communication          Time: 7948-0165 OT Time Calculation (min): 24 min  Charges: OT General Charges $OT Visit: 1 Visit OT Treatments $Self Care/Home Management : 8-22 mins $Therapeutic Activity: 8-22 mins  Lesle Chris, OTR/L [phone] 10/23/2017   [place] 10/23/2017, 11:19 AM

## 2017-10-23 NOTE — Progress Notes (Signed)
LCSW consulted for SNF placement.  Patient from [facility] facility. Patient is not agreeable to SNF and would like to return to her home with home health. [facility] uses [facility] for therapy.  Patient reports that she has used her facilities services before and she has access to additional assistance through the facility if needed.   LCSW signing off. No further CSW needs at this time.

## 2017-10-23 NOTE — Progress Notes (Signed)
[id number] Linda [name],BSN,RN3,[phone]/patient would like to use advanced hhc representative Santiago Glad alerted.

## 2017-10-23 NOTE — Discharge Instructions (Signed)

## 2017-10-24 ENCOUNTER — Telehealth: Payer: Self-pay

## 2017-10-24 LAB — URINE CULTURE

## 2017-10-24 NOTE — Telephone Encounter (Signed)
Noted--phone contact by nurse for TCM today.

## 2017-10-24 NOTE — Telephone Encounter (Addendum)
Transition Care Management Follow-up Telephone Call   Date discharged? 10/23/2017   How have you been since you were released from the hospital? "Still bruised up and sore"   Do you understand why you were in the hospital? yes   Do you understand the discharge instructions? yes   Where were you discharged to? Lives alone at [facility] independent living.    Items Reviewed:  Medications reviewed: no. Pt did not have meds/list at time of call.   Allergies reviewed: yes  Dietary changes reviewed: yes  Referrals reviewed: no   Functional Questionnaire:   Activities of Daily Living (ADLs):   She states they are independent in the following: ambulation, bathing and hygiene, feeding, continence, grooming, toileting and dressing. Walks with walker.  States they require assistance with the following: Patient states ADLs are difficult to complete. [place] Nurse to call back today to schedule visit.    Any transportation issues/concerns?: yes, Transportation provided by facility will only drive 10 miles out. Patient states she will drive herself. Advised patient not to drive, have son bring her to appt.   Any patient concerns? No.    Confirmed importance and date/time of follow-up visits scheduled yes  Provider Appointment booked with PCP on Wed, 10/29/17 @ 11. Left message with son, Gershon Mussel to call back and discuss mothers care and transportation need. Call returned,  confirmed he would bring patient to appointment next week.   Confirmed with patient if condition begins to worsen call PCP or go to the ER.  Patient was given the office number and encouraged to call back with question or concerns.  : yes

## 2017-10-26 DIAGNOSIS — J61 Pneumoconiosis due to asbestos and other mineral fibers: Secondary | ICD-10-CM | POA: Diagnosis not present

## 2017-10-26 DIAGNOSIS — I2510 Atherosclerotic heart disease of native coronary artery without angina pectoris: Secondary | ICD-10-CM | POA: Diagnosis not present

## 2017-10-26 DIAGNOSIS — M47892 Other spondylosis, cervical region: Secondary | ICD-10-CM | POA: Diagnosis not present

## 2017-10-26 DIAGNOSIS — M1990 Unspecified osteoarthritis, unspecified site: Secondary | ICD-10-CM | POA: Diagnosis not present

## 2017-10-26 DIAGNOSIS — Z7982 Long term (current) use of aspirin: Secondary | ICD-10-CM | POA: Diagnosis not present

## 2017-10-26 DIAGNOSIS — Z9981 Dependence on supplemental oxygen: Secondary | ICD-10-CM | POA: Diagnosis not present

## 2017-10-26 DIAGNOSIS — F419 Anxiety disorder, unspecified: Secondary | ICD-10-CM | POA: Diagnosis not present

## 2017-10-26 DIAGNOSIS — I5030 Unspecified diastolic (congestive) heart failure: Secondary | ICD-10-CM | POA: Diagnosis not present

## 2017-10-26 DIAGNOSIS — B9689 Other specified bacterial agents as the cause of diseases classified elsewhere: Secondary | ICD-10-CM | POA: Diagnosis not present

## 2017-10-26 DIAGNOSIS — M797 Fibromyalgia: Secondary | ICD-10-CM | POA: Diagnosis not present

## 2017-10-26 DIAGNOSIS — J449 Chronic obstructive pulmonary disease, unspecified: Secondary | ICD-10-CM | POA: Diagnosis not present

## 2017-10-26 DIAGNOSIS — D510 Vitamin B12 deficiency anemia due to intrinsic factor deficiency: Secondary | ICD-10-CM | POA: Diagnosis not present

## 2017-10-26 DIAGNOSIS — N390 Urinary tract infection, site not specified: Secondary | ICD-10-CM | POA: Diagnosis not present

## 2017-10-26 DIAGNOSIS — Z79891 Long term (current) use of opiate analgesic: Secondary | ICD-10-CM | POA: Diagnosis not present

## 2017-10-26 DIAGNOSIS — Z9181 History of falling: Secondary | ICD-10-CM | POA: Diagnosis not present

## 2017-10-26 DIAGNOSIS — F329 Major depressive disorder, single episode, unspecified: Secondary | ICD-10-CM | POA: Diagnosis not present

## 2017-10-26 DIAGNOSIS — I110 Hypertensive heart disease with heart failure: Secondary | ICD-10-CM | POA: Diagnosis not present

## 2017-10-28 DIAGNOSIS — I2510 Atherosclerotic heart disease of native coronary artery without angina pectoris: Secondary | ICD-10-CM | POA: Diagnosis not present

## 2017-10-28 DIAGNOSIS — I110 Hypertensive heart disease with heart failure: Secondary | ICD-10-CM | POA: Diagnosis not present

## 2017-10-28 DIAGNOSIS — J61 Pneumoconiosis due to asbestos and other mineral fibers: Secondary | ICD-10-CM | POA: Diagnosis not present

## 2017-10-28 DIAGNOSIS — N390 Urinary tract infection, site not specified: Secondary | ICD-10-CM | POA: Diagnosis not present

## 2017-10-28 DIAGNOSIS — B9689 Other specified bacterial agents as the cause of diseases classified elsewhere: Secondary | ICD-10-CM | POA: Diagnosis not present

## 2017-10-28 DIAGNOSIS — I5030 Unspecified diastolic (congestive) heart failure: Secondary | ICD-10-CM | POA: Diagnosis not present

## 2017-10-29 ENCOUNTER — Encounter: Payer: Self-pay | Admitting: Family Medicine

## 2017-10-29 ENCOUNTER — Other Ambulatory Visit: Payer: Self-pay

## 2017-10-29 ENCOUNTER — Ambulatory Visit (INDEPENDENT_AMBULATORY_CARE_PROVIDER_SITE_OTHER): Payer: Medicare Other | Admitting: Family Medicine

## 2017-10-29 VITALS — BP 107/69 | HR 89 | Temp 98.0°F | Resp 16 | Ht 63.0 in | Wt 150.8 lb

## 2017-10-29 DIAGNOSIS — N3000 Acute cystitis without hematuria: Secondary | ICD-10-CM | POA: Diagnosis not present

## 2017-10-29 DIAGNOSIS — G20 Parkinson's disease: Secondary | ICD-10-CM

## 2017-10-29 DIAGNOSIS — I6502 Occlusion and stenosis of left vertebral artery: Secondary | ICD-10-CM

## 2017-10-29 DIAGNOSIS — Z8659 Personal history of other mental and behavioral disorders: Secondary | ICD-10-CM

## 2017-10-29 DIAGNOSIS — W19XXXD Unspecified fall, subsequent encounter: Secondary | ICD-10-CM | POA: Diagnosis not present

## 2017-10-29 NOTE — Progress Notes (Addendum)
10/29/2017  CC:  Chief Complaint  Patient presents with  . Hospitalization Follow-up    TCM    Patient is a 71 y.o. Caucasian female who presents accompanied by her son for hospital follow up, specifically Transitional Care Services face-to-face visit. Dates hospitalized: 10/29-11/1, 2018. Days since d/c from hospital: 6 days. Patient was discharged from hospital to Patient to return to independent living facility with home health to be provided by [facility] Reason for admission to hospital: AMS with fall; found to have UTI. Date of interactive (phone) contact with patient and/or caregiver:: 10/24/17.  I have reviewed patient's discharge summary plus pertinent specific notes, labs, and imaging from the hospitalization.  Many radiographs of skeleton, also chest, also CT head--no acute abnormalities.  Echo stable. Carotid doppler with unclear abnormality--resistant flow in right vertebral artery may suggest distal obstruction.  Urine grew citrobacter sensitive to quinolones so she was d/c'd home on cipro.  Current condition:  Significantly improved: she had been having dysuria, urinary urgency, etc prior to getting sick/hospitalized. She is still finishing up her cipro and sx's have almost completely abated.  She has been at baseline mental status since d/c from hospital. Significantly fatigued/generalized weakness OVER her baseline.  Using walker regularly. Using oxygen on/off.  Weening off the oxygen under the instruction of pulm, Dr. Camillo Flaming.  In further evaluation today, we noted several signs of parkinsonism: mask-like facies, mild bilat hand tremor at rest (?L>R).  At her baseline, she cannot roll over in bed, has shuffling gait, bradykinesia, micrographia, and has had a few years of declining cognitive function.  She is on abilify but pt, her son, and I seemed to agree today that these sx's pre-dated starting this med.  Could certainly have been made worse or "unmasked" by this  med.  Medication reconciliation was done today and patient is taking meds as recommended by discharging hospitalist/specialist.    PMH:  Past Medical History:  Diagnosis Date  . Acute upper GI bleed 06/2014   while pt taking coumadin, plavix, and meloxicam---despite being told not to take coumadin.  . Anginal pain ([place])    Nonobstructive CAD 2014; however, her cardiologist put her on a statin for this and NOT for hyperlipidemia per pt report.  Atyp CP 08/2017 at card f/u, plan for myoc perf imaging.  . Anxiety    panic attacks  . Asthma    w/ asbestososis   . BPPV (benign paroxysmal positional vertigo) 12/16/2012  . Chronic diastolic CHF (congestive heart failure) (HCC)    dry wt as of 11/06/16 is 168 lbs.  . Chronic lower back pain   . COPD (chronic obstructive pulmonary disease) ([place])   . DDD (degenerative disc disease)    lumbar and cervical.   . Depression   . Diverticular disease   . Fibromyalgia    Patient states dx was around her late 80s but she had sx's for years prior to this.  . H/O hiatal hernia   . Hay fever   . History of pneumonia    hospitalized 12/2011, 02/2013, and 07/2013 [facility]) for this  . HTN (hypertension)   . Idiopathic angio-edema-urticaria 72014   Angioedema component was very minimal  . Insomnia   . Iron deficiency anemia    Hematologist in [place], [place] did extensive w/u; no cause found; failed oral supplement;; gets fairly regular (q54m or so) IV iron infusions (Venofer -iron sucrose- 200mg  with procrit.  "for 14 yr I've been getting blood work q month & getting infusions prn" (  07/12/2013).  Dr. Marin Olp locally, iron infusions done, EPO deficiency dx'd  . Migraine syndrome    "not as often anymore; used to be ~ q wk" (07/12/2013)  . Mixed incontinence urge and stress   . Nephrolithiasis    "passed all on my own or they are still in there" (07/12/2013)  . OSA on CPAP    prior to move to --had another sleep study 10/2015 w/pulm Dr. Camillo Flaming.  .  Osteoarthritis    "severe; progressing fast" (07/12/2013); multiple joints-not surgical candidate for TKR (03/2015)  . Pernicious anemia 08/24/2014  . Pleural plaque with presence of asbestos 07/22/2013  . Pulmonary embolism ([place]) 07/2013   Dx at [facility] with very small peripheral upper lobe pe 07/2013: pt took coumadin for about 8-9 mo  . Pyelonephritis    "several times over the last 30 yr" (07/12/2013)  . RBBB (right bundle branch block)   . Recurrent UTI    hx of hospitalization for pyelonephritis; started abx prophylaxis 06/2015  . Syncope    Hypotensive; ED visit--Dr. Terrence Dupont did Cath--nonobstructive CAD, EF 55-60%.  In retrospect, suspect pt rx med misuse/polypharmacy  . Tension headache, chronic     PSH:  Past Surgical History:  Procedure Laterality Date  . APPENDECTOMY  1960  . AXILLARY SURGERY Left 1978   Multiple "lump" in armpit per pt  . CARDIAC CATHETERIZATION  01/2013   nonobstructive CAD, EF 55-60%  . CARDIOVASCULAR STRESS TEST  02/22/15   Low risk myocard perf imaging; wall motion normal, normal EF  . COCCYX REMOVAL  1972  . DEXA  06/05/2017   T-score -3.1  . DILATION AND CURETTAGE OF UTERUS  ? 1970's  . EYE SURGERY Left 2012-2013   "injections for ~ 1 yr; don't really know what for" (07/12/2013)  . HEEL SPUR SURGERY Left 2008  . KNEE SURGERY  2005  . PLANTAR FASCIA RELEASE Left 2008  . SPIROMETRY  04/25/14   In hosp for acute asthma/COPD flare: mixed obstructive and restrictive lung disease. The FEV1 is severely reduced at 45% predicted.  FEV1 signif decreased compared to prior spirometry 07/23/13.  . TENDON RELEASE  1996   Right forearm and hand  . TOTAL ABDOMINAL HYSTERECTOMY  1974  . TRANSTHORACIC ECHOCARDIOGRAM  01/2013; 04/2014;08/2015; 09/2017   2014--NORMAL.  2015--focal basal septal hypertrophy, EF 55-60%, grade I diast dysfxn, mild LAE.  08/2015 EF 55-60%, nl LV syst fxn, grade I DD, valves wnl. 10/21/17: EF 65-70%, grd I DD, o/w normal.    MEDS:  Outpatient Medications  Prior to Visit  Medication Sig Dispense Refill  . albuterol (PROVENTIL HFA;VENTOLIN HFA) 108 (90 BASE) MCG/ACT inhaler Inhale 2 puffs into the lungs every 6 (six) hours as needed for wheezing or shortness of breath. 1 Inhaler 1  . alendronate (FOSAMAX) 70 MG tablet Take 1 tablet (70 mg total) by mouth every 7 (seven) days. Take with a full glass of water on an empty stomach, first thing in the morning and remain up right for 30 minutes after taking. 12 tablet 3  . ALPRAZolam (XANAX) 1 MG tablet TAKE 1 TABLET THREE TIMES DAILY AS NEEDED FOR ANXIETY. 90 tablet 5  . ARIPiprazole (ABILIFY) 10 MG tablet TAKE 1 TABLET ONCE DAILY. 90 tablet 3  . aspirin 81 MG tablet Take 81 mg by mouth at bedtime.    . diclofenac sodium (VOLTAREN) 1 % GEL Apply 2 g topically 3 (three) times daily as needed (pain). 2 Tube 2  . DULoxetine (CYMBALTA) 30 MG capsule  TAKE 1 CAPSULE ONCE DAILY ALONG WITH 60MG  CAPSULE FOR TOTAL 90MG  DAILY. 30 capsule 1  . DULoxetine (CYMBALTA) 60 MG capsule TAKE 1 CAPSULE A DAY TO BE COMBINED WITH 30MG  CAPSULE 90 capsule 1  . furosemide (LASIX) 40 MG tablet Take 1 tablet (40 mg total) by mouth daily. 60 tablet 11  . isosorbide mononitrate (IMDUR) 30 MG 24 hr tablet Take 1 tablet (30 mg total) by mouth 2 (two) times daily. 60mg  in the am and 30mg  in the pm 90 tablet 3  . metoprolol succinate (TOPROL XL) 25 MG 24 hr tablet Take 1 tablet (25 mg total) by mouth 2 (two) times daily. 60 tablet 11  . Multiple Vitamin (MULTIVITAMIN WITH MINERALS) TABS tablet Take 1 tablet by mouth daily.    . nitroGLYCERIN (NITROSTAT) 0.4 MG SL tablet Place 1 tablet (0.4 mg total) under the tongue every 5 (five) minutes as needed for chest pain (x 3 doses). 25 tablet 3  . oxyCODONE-acetaminophen (PERCOCET) 10-325 MG tablet Take 1 tablet by mouth every 6 (six) hours as needed for pain. 100 tablet 0  . OXYGEN Inhale 3 L into the lungs continuous.    . pantoprazole (PROTONIX) 40 MG tablet TAKE 1 TABLET DAILY. 90 tablet 3  .  polyethylene glycol (MIRALAX / GLYCOLAX) packet Take 17 g by mouth daily as needed (constipation). Mix in 8 oz liquid and drink    . potassium chloride SA (K-DUR,KLOR-CON) 20 MEQ tablet Take 20 mEq by mouth as directed. Take 2 tablets by mouth in the morning and tablet in the evening    . traZODone (DESYREL) 50 MG tablet TAKE 2-4 TABLETS AT BEDTIME 120 tablet 6  . Vitamin D, Ergocalciferol, (DRISDOL) 50000 units CAPS capsule 1 tab po TWICE per week 24 capsule 3   Facility-Administered Medications Prior to Visit  Medication Dose Route Frequency Provider Last Rate Last Dose  . ferumoxytol (FERAHEME) 510 mg in sodium chloride 0.9 % 100 mL IVPB  510 mg Intravenous Once Ennever, Rudell Cobb, MD       EXAM: BP 107/69 (BP Location: Left Arm, Patient Position: Sitting, Cuff Size: Normal)   Pulse 89   Temp 98 F (36.7 C) (Oral)   Resp 16   Ht 5\' 3"  (1.6 m)   Wt 150 lb 12 oz (68.4 kg)   SpO2 94%   BMI 26.70 kg/m  Gen: alert, in NAD, mask-like facial expression, oriented x 4. GUR:KYHC: no injection, icteris, swelling, or exudate.  EOMI, PERRLA. Mouth: lips without lesion/swelling.  Oral mucosa pink and moist. Oropharynx without erythema, exudate, or swelling.  Neck - No masses or thyromegaly or limitation in range of motion CV: RRR, no m/r/g Chest is clear, no wheezing or rales. Normal symmetric air entry throughout both lung fields. No chest wall deformities or tenderness. ABD: soft, ND/NT Neuro: patient with "stooped" posture, + mild bilat hand tremor (?L>R) with hands resting on lap and with arms held outstretched.  Strength 5-/5 bilat prox/dist.  Ambulates with shuffling gait, bradykinesia noted. No cogwheel rigidity.     Pertinent labs/imaging  Carotid duplex bilateral 10/21/17: Final Interpretation: Right Carotid: There is evidence in the right ICA of a 1-39% stenosis.  Left Carotid: There is evidence in the left ICA of a 1-39% stenosis.  Vertebrals: Both vertebral arteries were patent  with antegrade flow. High      resistant flow in the right vertebral artery may suggest distal      obstruction.    Chemistry  Component Value Date/Time   NA 139 10/23/2017 0547   NA 141 06/05/2015 1315   K 3.8 10/23/2017 0547   K 3.9 06/05/2015 1315   CL 101 10/23/2017 0547   CL 103 06/05/2015 1315   CO2 28 10/23/2017 0547   CO2 28 06/05/2015 1315   BUN 19 10/23/2017 0547   BUN 14 06/05/2015 1315   CREATININE 0.74 10/23/2017 0547   CREATININE 0.87 02/03/2017 1523      Component Value Date/Time   CALCIUM 8.5 (L) 10/23/2017 0547   CALCIUM 9.0 06/05/2015 1315   ALKPHOS 53 10/21/2017 0620   ALKPHOS 84 06/05/2015 1315   AST 46 (H) 10/21/2017 0620   AST 23 06/05/2015 1315   ALT 18 10/21/2017 0620   ALT 17 06/05/2015 1315   BILITOT 0.8 10/21/2017 0620   BILITOT 1.00 06/05/2015 1315     Lab Results  Component Value Date   WBC 6.3 10/22/2017   HGB 12.4 10/22/2017   HCT 38.8 10/22/2017   MCV 90.7 10/22/2017   PLT 185 10/22/2017    ASSESSMENT/PLAN:  1) UTI--resolving appropriately on cipro.  2) Altered mental status: resolved with treatment of UTI.  3) Fall, subsequent encounter---no injury sustained.    4) Left vertebral artery with potential obstruction: as per abnormal carotid duplex doppler noted in lab/imaging section above.  Pt and son are choosing to do no further evaluation of this at the present time b/c she is in the midst of recovery from hospitalization and beginning rehab.  We'll revisit this issue in the future (confirm w/CT angio head w or w/o contrast)+/-  Vasc surg referral?).  5) Parkinsonism: The consensus seems to be that her symptoms preceded the time of starting her on abilify, but we did discuss possible ween off this med to see how she responds.  However, we held off on this b/c the med has helped so much with her treatment resistant depression. Refer to neurology.  Medical decision making of high complexity was utilized  today.  An After Visit Summary was printed and given to the patient.  FOLLOW UP:  Keep appt already arranged with me for about a month from now.  Signed:  Crissie Sickles, MD           10/29/2017

## 2017-10-30 DIAGNOSIS — N390 Urinary tract infection, site not specified: Secondary | ICD-10-CM | POA: Diagnosis not present

## 2017-10-30 DIAGNOSIS — J61 Pneumoconiosis due to asbestos and other mineral fibers: Secondary | ICD-10-CM | POA: Diagnosis not present

## 2017-10-30 DIAGNOSIS — B9689 Other specified bacterial agents as the cause of diseases classified elsewhere: Secondary | ICD-10-CM | POA: Diagnosis not present

## 2017-10-30 DIAGNOSIS — I2510 Atherosclerotic heart disease of native coronary artery without angina pectoris: Secondary | ICD-10-CM | POA: Diagnosis not present

## 2017-10-30 DIAGNOSIS — I5030 Unspecified diastolic (congestive) heart failure: Secondary | ICD-10-CM | POA: Diagnosis not present

## 2017-10-30 DIAGNOSIS — I110 Hypertensive heart disease with heart failure: Secondary | ICD-10-CM | POA: Diagnosis not present

## 2017-11-03 DIAGNOSIS — I110 Hypertensive heart disease with heart failure: Secondary | ICD-10-CM | POA: Diagnosis not present

## 2017-11-03 DIAGNOSIS — B9689 Other specified bacterial agents as the cause of diseases classified elsewhere: Secondary | ICD-10-CM | POA: Diagnosis not present

## 2017-11-03 DIAGNOSIS — J61 Pneumoconiosis due to asbestos and other mineral fibers: Secondary | ICD-10-CM | POA: Diagnosis not present

## 2017-11-03 DIAGNOSIS — I2510 Atherosclerotic heart disease of native coronary artery without angina pectoris: Secondary | ICD-10-CM | POA: Diagnosis not present

## 2017-11-03 DIAGNOSIS — N390 Urinary tract infection, site not specified: Secondary | ICD-10-CM | POA: Diagnosis not present

## 2017-11-03 DIAGNOSIS — I5030 Unspecified diastolic (congestive) heart failure: Secondary | ICD-10-CM | POA: Diagnosis not present

## 2017-11-04 ENCOUNTER — Telehealth: Payer: Self-pay | Admitting: *Deleted

## 2017-11-04 DIAGNOSIS — I2510 Atherosclerotic heart disease of native coronary artery without angina pectoris: Secondary | ICD-10-CM | POA: Diagnosis not present

## 2017-11-04 DIAGNOSIS — I110 Hypertensive heart disease with heart failure: Secondary | ICD-10-CM | POA: Diagnosis not present

## 2017-11-04 DIAGNOSIS — B9689 Other specified bacterial agents as the cause of diseases classified elsewhere: Secondary | ICD-10-CM | POA: Diagnosis not present

## 2017-11-04 DIAGNOSIS — J61 Pneumoconiosis due to asbestos and other mineral fibers: Secondary | ICD-10-CM | POA: Diagnosis not present

## 2017-11-04 DIAGNOSIS — N390 Urinary tract infection, site not specified: Secondary | ICD-10-CM | POA: Diagnosis not present

## 2017-11-04 DIAGNOSIS — I5030 Unspecified diastolic (congestive) heart failure: Secondary | ICD-10-CM | POA: Diagnosis not present

## 2017-11-04 NOTE — Telephone Encounter (Signed)
Linda [name] PT with [facility] called requesting verbal order for PT 2 days a week x 4 weeks for fall risk and reduction.   I gave verbal okay for PT as requested.   Linda [name] PT with [facility] called back to request verbal order for manual therapy on knee and hips to reduce pain and increase motion.   Please advise. Thanks.

## 2017-11-04 NOTE — Telephone Encounter (Signed)
These orders are fine with me.

## 2017-11-05 ENCOUNTER — Ambulatory Visit: Payer: Medicare Other | Admitting: Physician Assistant

## 2017-11-05 ENCOUNTER — Encounter: Payer: Self-pay | Admitting: Neurology

## 2017-11-05 DIAGNOSIS — B9689 Other specified bacterial agents as the cause of diseases classified elsewhere: Secondary | ICD-10-CM | POA: Diagnosis not present

## 2017-11-05 DIAGNOSIS — N390 Urinary tract infection, site not specified: Secondary | ICD-10-CM | POA: Diagnosis not present

## 2017-11-05 DIAGNOSIS — J61 Pneumoconiosis due to asbestos and other mineral fibers: Secondary | ICD-10-CM | POA: Diagnosis not present

## 2017-11-05 DIAGNOSIS — I110 Hypertensive heart disease with heart failure: Secondary | ICD-10-CM | POA: Diagnosis not present

## 2017-11-05 DIAGNOSIS — I2510 Atherosclerotic heart disease of native coronary artery without angina pectoris: Secondary | ICD-10-CM | POA: Diagnosis not present

## 2017-11-05 DIAGNOSIS — I5030 Unspecified diastolic (congestive) heart failure: Secondary | ICD-10-CM | POA: Diagnosis not present

## 2017-11-05 NOTE — Telephone Encounter (Signed)
[name] advised and voiced understanding. 

## 2017-11-07 ENCOUNTER — Telehealth: Payer: Self-pay | Admitting: *Deleted

## 2017-11-07 NOTE — Telephone Encounter (Signed)
Manuela Schwartz OT with [facility] called requesting verbal orders for 2 days a week x 3 weeks and 1 day a week x 3 weeks with goal to help pt improve ADLs.   SW [name] and he gave verbal okay of these orders.   Manuela Schwartz advised and voiced understanding.

## 2017-11-08 DIAGNOSIS — I5030 Unspecified diastolic (congestive) heart failure: Secondary | ICD-10-CM | POA: Diagnosis not present

## 2017-11-08 DIAGNOSIS — J61 Pneumoconiosis due to asbestos and other mineral fibers: Secondary | ICD-10-CM | POA: Diagnosis not present

## 2017-11-08 DIAGNOSIS — N390 Urinary tract infection, site not specified: Secondary | ICD-10-CM | POA: Diagnosis not present

## 2017-11-08 DIAGNOSIS — B9689 Other specified bacterial agents as the cause of diseases classified elsewhere: Secondary | ICD-10-CM | POA: Diagnosis not present

## 2017-11-08 DIAGNOSIS — I110 Hypertensive heart disease with heart failure: Secondary | ICD-10-CM | POA: Diagnosis not present

## 2017-11-08 DIAGNOSIS — I2510 Atherosclerotic heart disease of native coronary artery without angina pectoris: Secondary | ICD-10-CM | POA: Diagnosis not present

## 2017-11-10 DIAGNOSIS — I5030 Unspecified diastolic (congestive) heart failure: Secondary | ICD-10-CM | POA: Diagnosis not present

## 2017-11-10 DIAGNOSIS — I110 Hypertensive heart disease with heart failure: Secondary | ICD-10-CM | POA: Diagnosis not present

## 2017-11-10 DIAGNOSIS — B9689 Other specified bacterial agents as the cause of diseases classified elsewhere: Secondary | ICD-10-CM | POA: Diagnosis not present

## 2017-11-10 DIAGNOSIS — N390 Urinary tract infection, site not specified: Secondary | ICD-10-CM | POA: Diagnosis not present

## 2017-11-10 DIAGNOSIS — I2510 Atherosclerotic heart disease of native coronary artery without angina pectoris: Secondary | ICD-10-CM | POA: Diagnosis not present

## 2017-11-10 DIAGNOSIS — J61 Pneumoconiosis due to asbestos and other mineral fibers: Secondary | ICD-10-CM | POA: Diagnosis not present

## 2017-11-12 DIAGNOSIS — I5030 Unspecified diastolic (congestive) heart failure: Secondary | ICD-10-CM | POA: Diagnosis not present

## 2017-11-12 DIAGNOSIS — I2510 Atherosclerotic heart disease of native coronary artery without angina pectoris: Secondary | ICD-10-CM | POA: Diagnosis not present

## 2017-11-12 DIAGNOSIS — J61 Pneumoconiosis due to asbestos and other mineral fibers: Secondary | ICD-10-CM | POA: Diagnosis not present

## 2017-11-12 DIAGNOSIS — I110 Hypertensive heart disease with heart failure: Secondary | ICD-10-CM | POA: Diagnosis not present

## 2017-11-12 DIAGNOSIS — B9689 Other specified bacterial agents as the cause of diseases classified elsewhere: Secondary | ICD-10-CM | POA: Diagnosis not present

## 2017-11-12 DIAGNOSIS — N390 Urinary tract infection, site not specified: Secondary | ICD-10-CM | POA: Diagnosis not present

## 2017-11-14 DIAGNOSIS — N390 Urinary tract infection, site not specified: Secondary | ICD-10-CM | POA: Diagnosis not present

## 2017-11-14 DIAGNOSIS — I110 Hypertensive heart disease with heart failure: Secondary | ICD-10-CM | POA: Diagnosis not present

## 2017-11-14 DIAGNOSIS — J61 Pneumoconiosis due to asbestos and other mineral fibers: Secondary | ICD-10-CM | POA: Diagnosis not present

## 2017-11-14 DIAGNOSIS — B9689 Other specified bacterial agents as the cause of diseases classified elsewhere: Secondary | ICD-10-CM | POA: Diagnosis not present

## 2017-11-14 DIAGNOSIS — I5030 Unspecified diastolic (congestive) heart failure: Secondary | ICD-10-CM | POA: Diagnosis not present

## 2017-11-14 DIAGNOSIS — I2510 Atherosclerotic heart disease of native coronary artery without angina pectoris: Secondary | ICD-10-CM | POA: Diagnosis not present

## 2017-11-15 ENCOUNTER — Other Ambulatory Visit: Payer: Self-pay | Admitting: Family Medicine

## 2017-11-17 ENCOUNTER — Encounter: Payer: Medicare Other | Attending: Physical Medicine & Rehabilitation | Admitting: Physical Medicine & Rehabilitation

## 2017-11-17 ENCOUNTER — Other Ambulatory Visit: Payer: Self-pay

## 2017-11-17 ENCOUNTER — Encounter: Payer: Self-pay | Admitting: Physical Medicine & Rehabilitation

## 2017-11-17 VITALS — BP 128/77 | HR 85

## 2017-11-17 DIAGNOSIS — M5416 Radiculopathy, lumbar region: Secondary | ICD-10-CM | POA: Diagnosis not present

## 2017-11-17 DIAGNOSIS — R2689 Other abnormalities of gait and mobility: Secondary | ICD-10-CM | POA: Insufficient documentation

## 2017-11-17 DIAGNOSIS — M1711 Unilateral primary osteoarthritis, right knee: Secondary | ICD-10-CM | POA: Diagnosis not present

## 2017-11-17 DIAGNOSIS — Z5181 Encounter for therapeutic drug level monitoring: Secondary | ICD-10-CM | POA: Diagnosis not present

## 2017-11-17 DIAGNOSIS — I209 Angina pectoris, unspecified: Secondary | ICD-10-CM | POA: Diagnosis not present

## 2017-11-17 DIAGNOSIS — Z79899 Other long term (current) drug therapy: Secondary | ICD-10-CM | POA: Insufficient documentation

## 2017-11-17 DIAGNOSIS — M1712 Unilateral primary osteoarthritis, left knee: Secondary | ICD-10-CM | POA: Diagnosis not present

## 2017-11-17 DIAGNOSIS — M797 Fibromyalgia: Secondary | ICD-10-CM

## 2017-11-17 MED ORDER — OXYCODONE-ACETAMINOPHEN 10-325 MG PO TABS: 1.0000 | ORAL_TABLET | Freq: Four times a day (QID) | ORAL | 0 refills | Status: DC | PRN

## 2017-11-17 NOTE — Telephone Encounter (Signed)
[facility]  RF request for abilify LOV: 10/29/17 Next ov: 11/27/17 Last written: 10/19/16 #90 w/ 3RF  Please advise. Thanks.

## 2017-11-17 NOTE — Patient Instructions (Signed)
PLEASE FEEL FREE TO CALL OUR OFFICE WITH ANY PROBLEMS OR QUESTIONS ([phone])      

## 2017-11-17 NOTE — Progress Notes (Signed)
Subjective:    Patient ID: Linda [name], female    DOB: Feb 14, 1946, 71 y.o.   MRN: [id number]  HPI [name] is here in follow-up of her chronic back and knee pain.  She has been seeing my nurse practitioner at the bulk of this year, her last visit being in March.  She is been in the hospital a couple times since I last saw her and had a fall last month which may have been related to a urinary tract infection.  She feels that over the last few months her balance has worsened substantially however.  Her knee pain is ongoing.  She is a not a surgical candidate.  She has gone to a [facility] for a holistic treatment plan but really has not had results.  She is using a walker for balance currently.  For pain currently she is taking Percocet 10/325 1 every 6 hours as needed.  She also uses Voltaren gel with some benefit. Pain Inventory Average Pain 5 Pain Right Now 7 My pain is .  In the last 24 hours, has pain interfered with the following? General activity 10 Relation with others 10 Enjoyment of life 10 What TIME of day is your pain at its worst? all Sleep (in general) Fair  Pain is worse with: walking, bending, standing and some activites Pain improves with: rest and medication Relief from Meds: 4  Mobility use a walker how many minutes can you walk? 5 ability to climb steps?  no do you drive?  yes Do you have any goals in this area?  yes  Function retired I need assistance with the following:  meal prep, household duties and shopping Do you have any goals in this area?  no  Neuro/Psych bladder control problems weakness trouble walking anxiety  Prior Studies Any changes since last visit?  no  Physicians involved in your care Any changes since last visit?  no   Family History  Problem Relation Age of Onset  . Arthritis Mother   . Kidney disease Mother   . Heart disease Father   . Stroke Father   . Hypertension Father   . Diabetes Father   . Heart attack  Father   . Heart attack Paternal Grandmother    Social History   Socioeconomic History  . Marital status: Widowed    Spouse name: Not on file  . Number of children: 2  . Years of education: Not on file  . Highest education level: Not on file  Social Needs  . Financial resource strain: Not on file  . Food insecurity - worry: Not on file  . Food insecurity - inability: Not on file  . Transportation needs - medical: Not on file  . Transportation needs - non-medical: Not on file  Occupational History  . Occupation: Retired  Tobacco Use  . Smoking status: Never Smoker  . Smokeless tobacco: Never Used  . Tobacco comment: never used tobacco  Substance and Sexual Activity  . Alcohol use: No    Alcohol/week: 0.0 oz  . Drug use: No  . Sexual activity: Not Currently  Other Topics Concern  . Not on file  Social History Narrative   Widowed, 2 sons.  Relocated to [place] 09/2012 to be closer to her son who has MS.   Husband d 2015--mesothelioma.   Occupation: former Pharmacist, hospital.   Education: masters degree level.   No T/A/Ds.   Lives in [place], independent living.   Past Surgical History:  Procedure Laterality  Date  . APPENDECTOMY  1960  . AXILLARY SURGERY Left 1978   Multiple "lump" in armpit per pt  . CARDIAC CATHETERIZATION  01/2013   nonobstructive CAD, EF 55-60%  . CARDIOVASCULAR STRESS TEST  02/22/15   Low risk myocard perf imaging; wall motion normal, normal EF  . carotid duplex doppler  10/21/2017   R vertebral flow suggestive of possible distal obstruction.  Pt declined further w/u as of 10/29/17 but need to revisit this problem periodically.  . COCCYX REMOVAL  1972  . DEXA  06/05/2017   T-score -3.1  . DILATION AND CURETTAGE OF UTERUS  ? 1970's  . ESOPHAGOGASTRODUODENOSCOPY N/A 07/19/2014   Gastritis found + in the setting of supratherapeutic INR, +plavix, + meloxicam.  . EYE SURGERY Left 2012-2013   "injections for ~ 1 yr; don't really know what for" (07/12/2013)  .  HEEL SPUR SURGERY Left 2008  . KNEE SURGERY  2005  . LEFT HEART CATHETERIZATION WITH CORONARY ANGIOGRAM N/A 01/30/2013   Procedure: LEFT HEART CATHETERIZATION WITH CORONARY ANGIOGRAM;  Surgeon: Clent Demark, MD;  Location: [facility] CATH LAB;  Service: Cardiovascular;  Laterality: N/A;  . PLANTAR FASCIA RELEASE Left 2008  . SPIROMETRY  04/25/14   In hosp for acute asthma/COPD flare: mixed obstructive and restrictive lung disease. The FEV1 is severely reduced at 45% predicted.  FEV1 signif decreased compared to prior spirometry 07/23/13.  . TENDON RELEASE  1996   Right forearm and hand  . TOTAL ABDOMINAL HYSTERECTOMY  1974  . TRANSTHORACIC ECHOCARDIOGRAM  01/2013; 04/2014;08/2015; 09/2017   2014--NORMAL.  2015--focal basal septal hypertrophy, EF 55-60%, grade I diast dysfxn, mild LAE.  08/2015 EF 55-60%, nl LV syst fxn, grade I DD, valves wnl. 10/21/17: EF 65-70%, grd I DD, o/w normal.   Past Medical History:  Diagnosis Date  . Acute upper GI bleed 06/2014   while pt taking coumadin, plavix, and meloxicam---despite being told not to take coumadin.  . Anginal pain ([place])    Nonobstructive CAD 2014; however, her cardiologist put her on a statin for this and NOT for hyperlipidemia per pt report.  Atyp CP 08/2017 at card f/u, plan for myoc perf imaging.  . Anxiety    panic attacks  . Asthma    w/ asbestososis   . BPPV (benign paroxysmal positional vertigo) 12/16/2012  . Chronic diastolic CHF (congestive heart failure) (HCC)    dry wt as of 11/06/16 is 168 lbs.  . Chronic lower back pain   . COPD (chronic obstructive pulmonary disease) ([place])   . DDD (degenerative disc disease)    lumbar and cervical.   . Depression   . Diverticular disease   . Fibromyalgia    Patient states dx was around her late 1s but she had sx's for years prior to this.  . H/O hiatal hernia   . Hay fever   . History of pneumonia    hospitalized 12/2011, 02/2013, and 07/2013 [facility]) for this  . HTN (hypertension)   . Idiopathic  angio-edema-urticaria 72014   Angioedema component was very minimal  . Insomnia   . Iron deficiency anemia    Hematologist in [place], [place] did extensive w/u; no cause found; failed oral supplement;; gets fairly regular (q60m or so) IV iron infusions (Venofer -iron sucrose- 200mg  with procrit.  "for 14 yr I've been getting blood work q month & getting infusions prn" (07/12/2013).  Dr. Marin Olp locally, iron infusions done, EPO deficiency dx'd  . Migraine syndrome    "not as often  anymore; used to be ~ q wk" (07/12/2013)  . Mixed incontinence urge and stress   . Nephrolithiasis    "passed all on my own or they are still in there" (07/12/2013)  . OSA on CPAP    prior to move to --had another sleep study 10/2015 w/pulm Dr. Camillo Flaming.  . Osteoarthritis    "severe; progressing fast" (07/12/2013); multiple joints-not surgical candidate for TKR (03/2015)  . Parkinsonism ([place]) 2018   Question of: referred to neuro 10/29/17  . Pernicious anemia 08/24/2014  . Pleural plaque with presence of asbestos 07/22/2013  . Pulmonary embolism ([place]) 07/2013   [facility]x at [facility] with very small peripheral upper lobe pe 07/2013: pt took coumadin for about 8-9 mo  . Pyelonephritis    "several times over the last 30 yr" (07/12/2013)  . RBBB (right bundle branch block)   . Recurrent UTI    hx of hospitalization for pyelonephritis; started abx prophylaxis 06/2015  . Syncope    Hypotensive; ED visit--Dr. Terrence Dupont did Cath--nonobstructive CAD, EF 55-60%.  In retrospect, suspect pt rx med misuse/polypharmacy  . Tension headache, chronic    There were no vitals taken for this visit.  Opioid Risk Score:  1 Fall Risk Score:  `1  Depression screen PHQ 2/9  Depression screen [facility] 2/9 11/17/2017 10/20/2017 09/17/2017 08/18/2017 07/21/2017 05/06/2017 08/01/2016  Decreased Interest 1 1 1  0 0 0 3  Down, Depressed, Hopeless 1 1 1 3 3 3 3   PHQ - 2 Score 2 2 2 3 3 3 6   Altered sleeping - - - - - - -  Tired, decreased energy - - - - - - -  Change in  appetite - - - - - - -  Feeling bad or failure about yourself  - - - - - - -  Trouble concentrating - - - - - - -  Moving slowly or fidgety/restless - - - - - - -  Suicidal thoughts - - - - - - -  PHQ-9 Score - - - - - - -  Some recent data might be hidden      Review of Systems  Constitutional: Positive for appetite change.  HENT: Negative.   Eyes: Negative.   Respiratory: Positive for shortness of breath.   Cardiovascular: Positive for leg swelling.  Gastrointestinal: Positive for nausea.  Endocrine: Negative.   Genitourinary: Negative.   Musculoskeletal: Negative.   Skin: Negative.   Allergic/Immunologic: Negative.   Neurological: Negative.   Hematological: Negative.   Psychiatric/Behavioral: Negative.        Objective:   Physical Exam  Constitutional: She is oriented to person, place, and time. She appears well-developedand well-nourished.  HENT:  Head: Normocephalicand atraumatic.  Neck: Normal range of motion. Neck supple.  Cardiovascular: RRR Pulmonary/Chest: CTA B.  Musculoskeletal:   Bilateral knees tender with palpation along medial and lateral joint line.  No noticeable effusions noted today.  She is antalgic somewhat on either leg. Neurological: She is alertand oriented to person, place, and time.  Patient demonstrates masked facies.  Speech is slightly muffled but intelligible.  Noted bradykinesias.  Needs substantial amount of time moving from a sitting to standing position.  Gait is slightly shuffling but stride length is reasonable.  Needs extra time to change directions.  Mild resting tremor noted.  No focal rigidity appreciated today. Skin: Skin is warmand dry.  Psychiatric: appears to be in good spirits.         Assessment & Plan:  1. Functional deficits  secondary to Gait disorder, presentation is highly suggestive of Parkinson's disease   -she is seeing neurology next week, so we'll hold off on initiating treatment right  now  -might be a candidate for Parkinson's/balance  therapies at [facility] neuro-rehab 2. Chronic Back pain/ Lumbar Radiculitis/fibromyalgia/R>L Knee OA Pain Management:  Refilled percocet 10 q6 prn #100.  Continue Lyrica and Voltaren Gel We will continue the opioid monitoring program, this consists of regular clinic visits, examinations, routine drug screening, pill counts as well as use of [place] Controlled Substance Reporting System. [place] today.   3. Depression with anxiety/Grief reaction/Mood: Continue Xanax,Trazodone and Cymbalta.   4. [name] with asthma: Oxygen dependent. Albuterol prn.  On Continuous Oxygen Therapy: oxygen prn?:Pulmonology Following. 5. Bilateral Osteoarthritis of Bilateral Knees:   -will set up for Zilretta injections to both knees.  6. Fibromyalgia: Continue Lyrica.  -exercise regimen was discussed again today.  22minutes of face to face patient care time was spent during this visit. All questions were encouraged and answered.  F/U within the month for knee injections.

## 2017-11-19 DIAGNOSIS — B9689 Other specified bacterial agents as the cause of diseases classified elsewhere: Secondary | ICD-10-CM | POA: Diagnosis not present

## 2017-11-19 DIAGNOSIS — N390 Urinary tract infection, site not specified: Secondary | ICD-10-CM | POA: Diagnosis not present

## 2017-11-19 DIAGNOSIS — I5030 Unspecified diastolic (congestive) heart failure: Secondary | ICD-10-CM | POA: Diagnosis not present

## 2017-11-19 DIAGNOSIS — J61 Pneumoconiosis due to asbestos and other mineral fibers: Secondary | ICD-10-CM | POA: Diagnosis not present

## 2017-11-19 DIAGNOSIS — I110 Hypertensive heart disease with heart failure: Secondary | ICD-10-CM | POA: Diagnosis not present

## 2017-11-19 DIAGNOSIS — I2510 Atherosclerotic heart disease of native coronary artery without angina pectoris: Secondary | ICD-10-CM | POA: Diagnosis not present

## 2017-11-20 DIAGNOSIS — I5030 Unspecified diastolic (congestive) heart failure: Secondary | ICD-10-CM | POA: Diagnosis not present

## 2017-11-20 DIAGNOSIS — B9689 Other specified bacterial agents as the cause of diseases classified elsewhere: Secondary | ICD-10-CM | POA: Diagnosis not present

## 2017-11-20 DIAGNOSIS — I2510 Atherosclerotic heart disease of native coronary artery without angina pectoris: Secondary | ICD-10-CM | POA: Diagnosis not present

## 2017-11-20 DIAGNOSIS — I110 Hypertensive heart disease with heart failure: Secondary | ICD-10-CM | POA: Diagnosis not present

## 2017-11-20 DIAGNOSIS — J61 Pneumoconiosis due to asbestos and other mineral fibers: Secondary | ICD-10-CM | POA: Diagnosis not present

## 2017-11-20 DIAGNOSIS — N390 Urinary tract infection, site not specified: Secondary | ICD-10-CM | POA: Diagnosis not present

## 2017-11-21 DIAGNOSIS — B9689 Other specified bacterial agents as the cause of diseases classified elsewhere: Secondary | ICD-10-CM | POA: Diagnosis not present

## 2017-11-21 DIAGNOSIS — N390 Urinary tract infection, site not specified: Secondary | ICD-10-CM | POA: Diagnosis not present

## 2017-11-21 DIAGNOSIS — I2510 Atherosclerotic heart disease of native coronary artery without angina pectoris: Secondary | ICD-10-CM | POA: Diagnosis not present

## 2017-11-21 DIAGNOSIS — I110 Hypertensive heart disease with heart failure: Secondary | ICD-10-CM | POA: Diagnosis not present

## 2017-11-21 DIAGNOSIS — I5030 Unspecified diastolic (congestive) heart failure: Secondary | ICD-10-CM | POA: Diagnosis not present

## 2017-11-21 DIAGNOSIS — J61 Pneumoconiosis due to asbestos and other mineral fibers: Secondary | ICD-10-CM | POA: Diagnosis not present

## 2017-11-21 NOTE — Progress Notes (Signed)
Linda [name] was seen today in the movement disorders clinic for neurologic consultation at the request of McGowen, Adrian Blackwater, MD.  The consultation is for the evaluation of tremor, gait instability and to rule out Parkinson's disease. Pt states that sx's have been going on for 2 years but son thinks about 3 years, but he isn't quite sure.  It is getting worse slowly.  This patient is accompanied in the office by her son who supplements the history.  Specific Symptoms:  Tremor: Yes.  , both hands, L more than R.  Worse when holding something in the hand, like her telephone.  She is R hand dominant Family hx of similar:  No. Voice: getting weaker Sleep: sleeping fair  Vivid Dreams:  No.  Acting out dreams:  No. Wet Pillows: No. Postural symptoms:  Yes.    Falls?  Yes.  , last fall about a month ago (woke up and R foot/leg gave out); is currently in PT.  Uses walker all of the time and has used that for 3 year Bradykinesia symptoms: shuffling gait, slow movements and difficulty getting out of a chair; decreased stride length Loss of smell:  No. Loss of taste:  No. Urinary Incontinence:  Yes.   Difficulty Swallowing:  No. Handwriting, micrographia: Yes.   Trouble with ADL's:  Yes.  , very, very slow  Trouble buttoning clothing: Yes.   - cannot do some things Depression:  Yes.   (she is currently on Abilify.  Records indicate that she has been on this at least since May, 2016). Memory changes:  Yes.   Hallucinations:  No.  visual distortions: No. N/V:  No. Lightheaded:  Yes.    Syncope: No. Diplopia:  Yes.  , horizontal, lasts only seconds "until I refocus" Dyskinesia:  No.  Neuroimaging of the brain has previously been performed.  I reviewed CT brain done on 10/21/17.  It demonstrated mild atrophy.  PREVIOUS MEDICATIONS: none to date  ALLERGIES:   Allergies  Allergen Reactions  . Penicillins Itching, Swelling and Rash    Tolerated Cefepime in ED. Has patient had a PCN  reaction causing immediate rash, facial/tongue/throat swelling, SOB or lightheadedness with hypotension: Yes Has patient had a PCN reaction causing severe rash involving mucus membranes or skin necrosis: No Has patient had a PCN reaction that required hospitalization: No  Has patient had a PCN reaction occurring within the last 10 years: No     CURRENT MEDICATIONS:  Outpatient Encounter Medications as of 11/24/2017  Medication Sig  . albuterol (PROVENTIL HFA;VENTOLIN HFA) 108 (90 BASE) MCG/ACT inhaler Inhale 2 puffs into the lungs every 6 (six) hours as needed for wheezing or shortness of breath.  [name] Kitchen alendronate (FOSAMAX) 70 MG tablet Take 1 tablet (70 mg total) by mouth every 7 (seven) days. Take with a full glass of water on an empty stomach, first thing in the morning and remain up right for 30 minutes after taking.  [name] Kitchen ALPRAZolam (XANAX) 1 MG tablet TAKE 1 TABLET THREE TIMES DAILY AS NEEDED FOR ANXIETY.  [name] Kitchen ARIPiprazole (ABILIFY) 10 MG tablet TAKE 1 TABLET ONCE DAILY.  [name] Kitchen aspirin 81 MG tablet Take 81 mg by mouth at bedtime.  . diclofenac sodium (VOLTAREN) 1 % GEL Apply 2 g topically 3 (three) times daily as needed (pain).  . DULoxetine (CYMBALTA) 30 MG capsule TAKE 1 CAPSULE ONCE DAILY ALONG WITH 60MG  CAPSULE FOR TOTAL 90MG  DAILY.  . DULoxetine (CYMBALTA) 60 MG capsule TAKE 1 CAPSULE A DAY  TO BE COMBINED WITH 30MG  CAPSULE  . furosemide (LASIX) 40 MG tablet Take 1 tablet (40 mg total) by mouth daily.  . isosorbide mononitrate (IMDUR) 30 MG 24 hr tablet Take 1 tablet (30 mg total) by mouth 2 (two) times daily. 60mg  in the am and 30mg  in the pm  . metoprolol succinate (TOPROL XL) 25 MG 24 hr tablet Take 1 tablet (25 mg total) by mouth 2 (two) times daily.  . Multiple Vitamin (MULTIVITAMIN WITH MINERALS) TABS tablet Take 1 tablet by mouth daily.  [name] Kitchen oxyCODONE-acetaminophen (PERCOCET) 10-325 MG tablet Take 1 tablet by mouth every 6 (six) hours as needed for pain.  . OXYGEN Inhale 3 L into the lungs  continuous.  . pantoprazole (PROTONIX) 40 MG tablet TAKE 1 TABLET DAILY.  [name] Kitchen polyethylene glycol (MIRALAX / GLYCOLAX) packet Take 17 g by mouth daily as needed (constipation). Mix in 8 oz liquid and drink  . potassium chloride SA (K-DUR,KLOR-CON) 20 MEQ tablet Take 20 mEq by mouth as directed. Take 2 tablets by mouth in the morning and tablet in the evening  . traZODone (DESYREL) 50 MG tablet TAKE 2-4 TABLETS AT BEDTIME  . Vitamin D, Ergocalciferol, (DRISDOL) 50000 units CAPS capsule 1 tab po TWICE per week  . nitroGLYCERIN (NITROSTAT) 0.4 MG SL tablet Place 1 tablet (0.4 mg total) under the tongue every 5 (five) minutes as needed for chest pain (x 3 doses). (Patient not taking: Reported on 11/24/2017)   Facility-Administered Encounter Medications as of 11/24/2017  Medication  . ferumoxytol (FERAHEME) 510 mg in sodium chloride 0.9 % 100 mL IVPB    PAST MEDICAL HISTORY:   Past Medical History:  Diagnosis Date  . Acute upper GI bleed 06/2014   while pt taking coumadin, plavix, and meloxicam---despite being told not to take coumadin.  . Anginal pain ([place])    Nonobstructive CAD 2014; however, her cardiologist put her on a statin for this and NOT for hyperlipidemia per pt report.  Atyp CP 08/2017 at card f/u, plan for myoc perf imaging.  . Anxiety    panic attacks  . Asthma    w/ asbestososis   . BPPV (benign paroxysmal positional vertigo) 12/16/2012  . Chronic diastolic CHF (congestive heart failure) ([facility])    dry wt as of 11/06/16 is 168 lbs.  . Chronic lower back pain   . COPD (chronic obstructive pulmonary disease) ([place])   . DDD (degenerative disc disease)    lumbar and cervical.   . Depression   . Diverticular disease   . Fibromyalgia    Patient states dx was around her late 14s but she had sx's for years prior to this.  . H/O hiatal hernia   . Hay fever   . History of pneumonia    hospitalized 12/2011, 02/2013, and 07/2013 [facility]) for this  . HTN (hypertension)   . Idiopathic  angio-edema-urticaria 72014   Angioedema component was very minimal  . Insomnia   . Iron deficiency anemia    Hematologist in [place], [place] did extensive w/u; no cause found; failed oral supplement;; gets fairly regular (q85m or so) IV iron infusions (Venofer -iron sucrose- 200mg  with procrit.  "for 14 yr I've been getting blood work q month & getting infusions prn" (07/12/2013).  Dr. Marin Olp locally, iron infusions done, EPO deficiency dx'd  . Migraine syndrome    "not as often anymore; used to be ~ q wk" (07/12/2013)  . Mixed incontinence urge and stress   . Nephrolithiasis    "passed all  on my own or they are still in there" (07/12/2013)  . OSA on CPAP    prior to move to [place]--had another sleep study 10/2015 w/pulm Dr. Camillo Flaming.  . Osteoarthritis    "severe; progressing fast" (07/12/2013); multiple joints-not surgical candidate for TKR (03/2015)  . Parkinsonism ([place]) 2018   Question of: referred to neuro 10/29/17  . Pernicious anemia 08/24/2014  . Pleural plaque with presence of asbestos 07/22/2013  . Pulmonary embolism ([place]) 07/2013   Dx at [facility] with very small peripheral upper lobe pe 07/2013: pt took coumadin for about 8-9 mo  . Pyelonephritis    "several times over the last 30 yr" (07/12/2013)  . RBBB (right bundle branch block)   . Recurrent UTI    hx of hospitalization for pyelonephritis; started abx prophylaxis 06/2015  . Syncope    Hypotensive; ED visit--Dr. Terrence Dupont did Cath--nonobstructive CAD, EF 55-60%.  In retrospect, suspect pt rx med misuse/polypharmacy  . Tension headache, chronic     PAST SURGICAL HISTORY:   Past Surgical History:  Procedure Laterality Date  . APPENDECTOMY  1960  . AXILLARY SURGERY Left 1978   Multiple "lump" in armpit per pt  . CARDIAC CATHETERIZATION  01/2013   nonobstructive CAD, EF 55-60%  . CARDIOVASCULAR STRESS TEST  02/22/15   Low risk myocard perf imaging; wall motion normal, normal EF  . carotid duplex doppler  10/21/2017   R vertebral flow suggestive  of possible distal obstruction.  Pt declined further w/u as of 10/29/17 but need to revisit this problem periodically.  . COCCYX REMOVAL  1972  . DEXA  06/05/2017   T-score -3.1  . DILATION AND CURETTAGE OF UTERUS  ? 1970's  . ESOPHAGOGASTRODUODENOSCOPY N/A 07/19/2014   Gastritis found + in the setting of supratherapeutic INR, +plavix, + meloxicam.  . EYE SURGERY Left 2012-2013   "injections for ~ 1 yr; don't really know what for" (07/12/2013)  . HEEL SPUR SURGERY Left 2008  . KNEE SURGERY  2005  . LEFT HEART CATHETERIZATION WITH CORONARY ANGIOGRAM N/A 01/30/2013   Procedure: LEFT HEART CATHETERIZATION WITH CORONARY ANGIOGRAM;  Surgeon: Clent Demark, MD;  Location[facility] C[facility] LAB;  Service: Cardiovascular;  Laterality: N/A;  . PLANTAR FASCIA RELEASE Left 2008  . SPIROMETRY  04/25/14   In hosp for acute asthma/COPD flare: mixed obstructive and restrictive lung disease. The FEV1 is severely reduced at 45% predicted.  FEV1 signif decreased compared to prior spirometry 07/23/13.  . TENDON RELEASE  1996   Right forearm and hand  . TOTAL ABDOMINAL HYSTERECTOMY  1974  . TRANSTHORACIC ECHOCARDIOGRAM  01/2013; 04/2014;08/2015; 09/2017   2014--NORMAL.  2015--focal basal septal hypertrophy, EF 55-60%, grade I diast dysfxn, mild LAE.  08/2015 EF 55-60%, nl LV syst fxn, grade I DD, valves wnl. 10/21/17: EF 65-70%, grd I DD, o/w normal.    SOCIAL HISTORY:   Social History   Socioeconomic History  . Marital status: Widowed    Spouse name: Not on file  . Number of children: 2  . Years of education: Not on file  . Highest education level: Not on file  Social Needs  . Financial resource strain: Not on file  . Food insecurity - worry: Not on file  . Food insecurity - inability: Not on file  . Transportation needs - medical: Not on file  . Transportation needs - non-medical: Not on file  Occupational History  . Occupation: Retired  Tobacco Use  . Smoking status: Never Smoker  . Smokeless tobacco: Never  Used   . Tobacco comment: never used tobacco  Substance and Sexual Activity  . Alcohol use: No    Alcohol/week: 0.0 oz  . Drug use: No  . Sexual activity: Not Currently  Other Topics Concern  . Not on file  Social History Narrative   Widowed, 2 sons.  Relocated to [place] 09/2012 to be closer to her son who has MS.   Husband d 2015--mesothelioma.   Occupation: former Pharmacist, hospital.   Education: masters degree level.   No T/A/Ds.   Lives in [place], independent living.    FAMILY HISTORY:   Family Status  Relation Name Status  . Mother  Deceased  . Father  Deceased  . PGM  (Not Specified)  . Sister x3 Alive  . Brother Social research officer, government  . Daughter  Alive  . Son  Alive    ROS:  Has knee pain - on average takes percocet bid.  A complete 10 system review of systems was obtained and was unremarkable apart from what is mentioned above.  PHYSICAL EXAMINATION:    VITALS:   Vitals:   11/24/17 1250  BP: 128/70  Pulse: 88  SpO2: 92%  Weight: 149 lb (67.6 kg)  Height: 5\' 3"  (1.6 m)    GEN:  The patient appears stated age and is in NAD. HEENT:  Normocephalic, atraumatic.  The mucous membranes are moist. The superficial temporal arteries are without ropiness or tenderness. CV:  RRR Lungs:  CTAB Neck/HEME:  There are no carotid bruits bilaterally.  Neurological examination:  Orientation: The patient is alert and oriented x3. Fund of knowledge is appropriate.  Recent and remote memory are intact.  Attention and concentration are normal.    Able to name objects and repeat phrases. Cranial nerves: There is good facial symmetry. There is facial hypomimia.  Pupils are equal round and reactive to light bilaterally. Fundoscopic exam reveals clear margins bilaterally. Extraocular muscles are intact. The visual fields are full to confrontational testing. The speech is fluent and clear but it is hypophonic. Soft palate rises symmetrically and there is no tongue deviation. Hearing is intact to conversational  tone. Sensation: Sensation is intact to light and pinprick throughout (facial, trunk, extremities). Vibration is intact at the bilateral big toe. There is no extinction with double simultaneous stimulation. There is no sensory dermatomal level identified. Motor: Strength is 5/5 in the bilateral upper and lower extremities.   Shoulder shrug is equal and symmetric.  There is no pronator drift. Deep tendon reflexes: Deep tendon reflexes are 3/4 at the bilateral biceps, triceps, brachioradialis, patella and achilles. Plantar responses are downgoing bilaterally.  Movement examination: Tone: There is mod increased tone in the R>LUE.  The tone in the lower extremities is increased, mildly so.  Abnormal movements: There is rare tremor in the thumbs bilaterally Coordination:  There is mild decremation with RAM's, with any form of RAMS, including alternating supination and pronation of the forearm, hand opening and closing, finger taps, heel taps and toe taps bilaterally Gait and Station: The patient has difficulty arising out of a deep-seated chair without the use of the hands.  She makes 2 unsuccessful attempts without the hand and then pushes off of the chair.  She gets her walker and walks fairly well with that.  Without the walker, she slightly drags the left leg and at times catches the toes on the ground.    Labs:    Chemistry      Component Value Date/Time   NA 139  10/23/2017 0547   NA 141 06/05/2015 1315   K 3.8 10/23/2017 0547   K 3.9 06/05/2015 1315   CL 101 10/23/2017 0547   CL 103 06/05/2015 1315   CO2 28 10/23/2017 0547   CO2 28 06/05/2015 1315   BUN 19 10/23/2017 0547   BUN 14 06/05/2015 1315   CREATININE 0.74 10/23/2017 0547   CREATININE 0.87 02/03/2017 1523      Component Value Date/Time   CALCIUM 8.5 (L) 10/23/2017 0547   CALCIUM 9.0 06/05/2015 1315   ALKPHOS 53 10/21/2017 0620   ALKPHOS 84 06/05/2015 1315   AST 46 (H) 10/21/2017 0620   AST 23 06/05/2015 1315   ALT 18  10/21/2017 0620   ALT 17 06/05/2015 1315   BILITOT 0.8 10/21/2017 0620   BILITOT 1.00 06/05/2015 1315     Lab Results  Component Value Date   TSH 0.745 10/21/2017   Lab Results  Component Value Date   VITAMINB12 349 01/02/2016     ASSESSMENT/PLAN:  1.  Parkinsonism  -I had a long counseling session with the patient today.  I discussed with the patient that one cannot make a diagnosis of idiopathic Parkinson's disease while one is on an antipsychotic such as Abilify.  She has been on Abilify since May, 2016.  I did go back to the notes preceding that date and did not see that she had symptoms prior to that.  Regardless, the diagnosis of Parkinson's disease cannot be made while on this medication.  In addition, if patients are able to get off of these types of medications, they have to be off of the medication at least 6 months before the diagnosis of Parkinson's disease can be made.  It was my suggestion that she follow-up with her prescribing physician to talk about alternatives to this medication that do not block the D2 receptor.  I explained that Abilify blocks the D2 receptor about 10 times stronger than many other of antipsychotic agents.  In theory, it would be ideal if she was off of the antipsychotic class of medications altogether.  Obviously, her mood would dictate this and I will defer to her treating physician.  She has a follow-up with her primary care physician on Thursday and is going to talk about this.  We talked about the importance of not stopping this medication without guidance from her treating physician.  I will plan on seeing the patient back in 6 months, sooner should new neurologic issues arise.  Cc:  Tammi Sou, MD

## 2017-11-22 DIAGNOSIS — B9689 Other specified bacterial agents as the cause of diseases classified elsewhere: Secondary | ICD-10-CM | POA: Diagnosis not present

## 2017-11-22 DIAGNOSIS — I2510 Atherosclerotic heart disease of native coronary artery without angina pectoris: Secondary | ICD-10-CM | POA: Diagnosis not present

## 2017-11-22 DIAGNOSIS — J61 Pneumoconiosis due to asbestos and other mineral fibers: Secondary | ICD-10-CM | POA: Diagnosis not present

## 2017-11-22 DIAGNOSIS — I5030 Unspecified diastolic (congestive) heart failure: Secondary | ICD-10-CM | POA: Diagnosis not present

## 2017-11-22 DIAGNOSIS — N390 Urinary tract infection, site not specified: Secondary | ICD-10-CM | POA: Diagnosis not present

## 2017-11-22 DIAGNOSIS — I110 Hypertensive heart disease with heart failure: Secondary | ICD-10-CM | POA: Diagnosis not present

## 2017-11-24 ENCOUNTER — Ambulatory Visit (INDEPENDENT_AMBULATORY_CARE_PROVIDER_SITE_OTHER): Payer: Medicare Other | Admitting: Neurology

## 2017-11-24 ENCOUNTER — Encounter: Payer: Self-pay | Admitting: Neurology

## 2017-11-24 VITALS — BP 128/70 | HR 88 | Ht 63.0 in | Wt 149.0 lb

## 2017-11-24 DIAGNOSIS — I209 Angina pectoris, unspecified: Secondary | ICD-10-CM

## 2017-11-24 DIAGNOSIS — F331 Major depressive disorder, recurrent, moderate: Secondary | ICD-10-CM | POA: Diagnosis not present

## 2017-11-24 DIAGNOSIS — G2111 Neuroleptic induced parkinsonism: Secondary | ICD-10-CM

## 2017-11-24 NOTE — Patient Instructions (Signed)
Good to see you today!!  Make a follow up appointment with McGowen, Adrian Blackwater, MD and talk to him about your abilify.  He may need to refer you to psychiatry and he may be able to take care of your depression medication himself, but do NOT stop it without talking to him about an alternative plan.  I will see you in 6 months!  Merry Christmas!

## 2017-11-25 ENCOUNTER — Telehealth: Payer: Self-pay | Admitting: Family Medicine

## 2017-11-25 DIAGNOSIS — B9689 Other specified bacterial agents as the cause of diseases classified elsewhere: Secondary | ICD-10-CM | POA: Diagnosis not present

## 2017-11-25 DIAGNOSIS — I110 Hypertensive heart disease with heart failure: Secondary | ICD-10-CM | POA: Diagnosis not present

## 2017-11-25 DIAGNOSIS — J61 Pneumoconiosis due to asbestos and other mineral fibers: Secondary | ICD-10-CM | POA: Diagnosis not present

## 2017-11-25 DIAGNOSIS — I2510 Atherosclerotic heart disease of native coronary artery without angina pectoris: Secondary | ICD-10-CM | POA: Diagnosis not present

## 2017-11-25 DIAGNOSIS — I5030 Unspecified diastolic (congestive) heart failure: Secondary | ICD-10-CM | POA: Diagnosis not present

## 2017-11-25 DIAGNOSIS — N390 Urinary tract infection, site not specified: Secondary | ICD-10-CM | POA: Diagnosis not present

## 2017-11-25 NOTE — Telephone Encounter (Signed)
Noted. I saw the neuro note.

## 2017-11-25 NOTE — Telephone Encounter (Signed)
Copied from [place] [id number]. Topic: Quick Communication - See Telephone Encounter >> Nov 25, 2017 11:28 AM Clack, Laban Emperor wrote: CRM for notification. See Telephone encounter for:  Pt son Gershon Mussel wanted to call and let the provider know that the pt went to her appt to get tested for parkinsonism and the dr believe her symptoms to be related to the medication ARIPiprazole (ABILIFY) 10 MG tablet [[id number]]. Tom wanted to let the provider know before the pt came in for her appt on 11/27/17 11/25/17.

## 2017-11-25 NOTE — Telephone Encounter (Signed)
FYI

## 2017-11-26 NOTE — Telephone Encounter (Signed)
Copied from [place] [id number]. Topic: Inquiry >> Nov 25, 2017  4:24 PM Oliver Pila B wrote: Reason for CRM: Clair Gulling from [facility] called to relate verbal orders for medical social worker for commute resources, contact [place] @ [phone]

## 2017-11-26 NOTE — Telephone Encounter (Signed)
Left detailed message on Linda [name]'s vm.

## 2017-11-26 NOTE — Telephone Encounter (Signed)
Please advise. Thanks.  

## 2017-11-26 NOTE — Telephone Encounter (Signed)
Verbal order for this ok.

## 2017-11-27 ENCOUNTER — Encounter: Payer: Self-pay | Admitting: Family Medicine

## 2017-11-27 ENCOUNTER — Ambulatory Visit (INDEPENDENT_AMBULATORY_CARE_PROVIDER_SITE_OTHER): Payer: Medicare Other | Admitting: Family Medicine

## 2017-11-27 VITALS — BP 124/66 | HR 85 | Temp 97.7°F | Resp 16 | Ht 63.0 in | Wt 148.0 lb

## 2017-11-27 DIAGNOSIS — I110 Hypertensive heart disease with heart failure: Secondary | ICD-10-CM | POA: Diagnosis not present

## 2017-11-27 DIAGNOSIS — J61 Pneumoconiosis due to asbestos and other mineral fibers: Secondary | ICD-10-CM | POA: Diagnosis not present

## 2017-11-27 DIAGNOSIS — I209 Angina pectoris, unspecified: Secondary | ICD-10-CM | POA: Diagnosis not present

## 2017-11-27 DIAGNOSIS — I2510 Atherosclerotic heart disease of native coronary artery without angina pectoris: Secondary | ICD-10-CM | POA: Diagnosis not present

## 2017-11-27 DIAGNOSIS — G2111 Neuroleptic induced parkinsonism: Secondary | ICD-10-CM

## 2017-11-27 DIAGNOSIS — F3341 Major depressive disorder, recurrent, in partial remission: Secondary | ICD-10-CM | POA: Diagnosis not present

## 2017-11-27 DIAGNOSIS — I5030 Unspecified diastolic (congestive) heart failure: Secondary | ICD-10-CM | POA: Diagnosis not present

## 2017-11-27 DIAGNOSIS — N390 Urinary tract infection, site not specified: Secondary | ICD-10-CM | POA: Diagnosis not present

## 2017-11-27 DIAGNOSIS — B9689 Other specified bacterial agents as the cause of diseases classified elsewhere: Secondary | ICD-10-CM | POA: Diagnosis not present

## 2017-11-27 MED ORDER — ARIPIPRAZOLE 5 MG PO TABS: ORAL_TABLET | ORAL | 0 refills | Status: DC

## 2017-11-27 MED ORDER — LAMOTRIGINE 25 MG PO TABS: ORAL_TABLET | ORAL | 0 refills | Status: DC

## 2017-11-27 NOTE — Progress Notes (Signed)
OFFICE VISIT  11/27/2017   CC:  Chief Complaint  Patient presents with  . Follow-up    RCI, pt is not fasting.    HPI:    Patient is a 71 y.o. Caucasian female who presents accompanied by her son for f/u of her recently dx'd parkinsonism. Neurologist (Dr. Carles Collet), who saw pt 11/24/17, feels this is neuroleptic induced until proven otherwise, and recommended she be taken off her abilify.  She has longstanding MDD/recurrent, and GAD-- complicated by prolonged/abnormal grief response from the death of her husband a couple of years back. Additionally complicated by deteriorating health over the last several years. Abilify had been helping significantly.  Discussed alternative med options for ongoing tx of her quite problematic depression:  Lamictal=first choice, but also consider valproate, carbamazapine, lithium.  Past Medical History:  Diagnosis Date  . Acute upper GI bleed 06/2014   while pt taking coumadin, plavix, and meloxicam---despite being told not to take coumadin.  . Anginal pain ([place])    Nonobstructive CAD 2014; however, her cardiologist put her on a statin for this and NOT for hyperlipidemia per pt report.  Atyp CP 08/2017 at card f/u, plan for myoc perf imaging.  . Anxiety    panic attacks  . Asthma    w/ asbestososis   . BPPV (benign paroxysmal positional vertigo) 12/16/2012  . Chronic diastolic CHF (congestive heart failure) (HCC)    dry wt as of 11/06/16 is 168 lbs.  . Chronic lower back pain   . COPD (chronic obstructive pulmonary disease) ([place])   . DDD (degenerative disc disease)    lumbar and cervical.   . Diverticular disease   . Fibromyalgia    Patient states dx was around her late 55s but she had sx's for years prior to this.  . H/O hiatal hernia   . Hay fever   . History of pneumonia    hospitalized 12/2011, 02/2013, and 07/2013 [facility]) for this  . HTN (hypertension)   . Idiopathic angio-edema-urticaria 72014   Angioedema component was very minimal  .  Insomnia   . Iron deficiency anemia    Hematologist in [place], [place] did extensive w/u; no cause found; failed oral supplement;; gets fairly regular (q30m or so) IV iron infusions (Venofer -iron sucrose- 200mg  with procrit.  "for 14 yr I've been getting blood work q month & getting infusions prn" (07/12/2013).  Dr. Marin Olp locally, iron infusions done, EPO deficiency dx'd  . Migraine syndrome    "not as often anymore; used to be ~ q wk" (07/12/2013)  . Mixed incontinence urge and stress   . Nephrolithiasis    "passed all on my own or they are still in there" (07/12/2013)  . OSA on CPAP    prior to move to [place]--had another sleep study 10/2015 w/pulm Dr. Camillo Flaming.  . Osteoarthritis    "severe; progressing fast" (07/12/2013); multiple joints-not surgical candidate for TKR (03/2015)  . Parkinsonism [facility]) 2018   Dr. Carles Collet, neuro, saw her 11/24/17 and recommended d/c of abilify as first step.   [name] Kitchen Pernicious anemia 08/24/2014  . Pleural plaque with presence of asbestos 07/22/2013  . Pulmonary embolism ([place]) 07/2013   Dx at [facility] with very small peripheral upper lobe pe 07/2013: pt took coumadin for about 8-9 mo  . Pyelonephritis    "several times over the last 30 yr" (07/12/2013)  . RBBB (right bundle branch block)   . Recurrent major depression ([place])   . Recurrent UTI    hx of hospitalization for  pyelonephritis; started abx prophylaxis 06/2015  . Syncope    Hypotensive; ED visit--Dr. Terrence Dupont did Cath--nonobstructive CAD, EF 55-60%.  In retrospect, suspect pt rx med misuse/polypharmacy  . Tension headache, chronic     Past Surgical History:  Procedure Laterality Date  . APPENDECTOMY  1960  . AXILLARY SURGERY Left 1978   Multiple "lump" in armpit per pt  . CARDIAC CATHETERIZATION  01/2013   nonobstructive CAD, EF 55-60%  . CARDIOVASCULAR STRESS TEST  02/22/15   Low risk myocard perf imaging; wall motion normal, normal EF  . carotid duplex doppler  10/21/2017   R vertebral flow suggestive of possible distal  obstruction.  Pt declined further w/u as of 10/29/17 but need to revisit this problem periodically.  . COCCYX REMOVAL  1972  . DEXA  06/05/2017   T-score -3.1  . DILATION AND CURETTAGE OF UTERUS  ? 1970's  . ESOPHAGOGASTRODUODENOSCOPY N/A 07/19/2014   Gastritis found + in the setting of supratherapeutic INR, +plavix, + meloxicam.  . EYE SURGERY Left 2012-2013   "injections for ~ 1 yr; don't really know what for" (07/12/2013)  . HEEL SPUR SURGERY Left 2008  . KNEE SURGERY  2005  . LEFT HEART CATHETERIZATION WITH CORONARY ANGIOGRAM N/A 01/30/2013   Procedure: LEFT HEART CATHETERIZATION WITH CORONARY ANGIOGRAM;  Surgeon: Clent Demark, MD;  Location: [facility] CATH LAB;  Service: Cardiovascular;  Laterality: N/A;  . PLANTAR FASCIA RELEASE Left 2008  . SPIROMETRY  04/25/14   In hosp for acute asthma/COPD flare: mixed obstructive and restrictive lung disease. The FEV1 is severely reduced at 45% predicted.  FEV1 signif decreased compared to prior spirometry 07/23/13.  . TENDON RELEASE  1996   Right forearm and hand  . TOTAL ABDOMINAL HYSTERECTOMY  1974  . TRANSTHORACIC ECHOCARDIOGRAM  01/2013; 04/2014;08/2015; 09/2017   2014--NORMAL.  2015--focal basal septal hypertrophy, EF 55-60%, grade I diast dysfxn, mild LAE.  08/2015 EF 55-60%, nl LV syst fxn, grade I DD, valves wnl. 10/21/17: EF 65-70%, grd I DD, o/w normal.    Outpatient Medications Prior to Visit  Medication Sig Dispense Refill  . albuterol (PROVENTIL HFA;VENTOLIN HFA) 108 (90 BASE) MCG/ACT inhaler Inhale 2 puffs into the lungs every 6 (six) hours as needed for wheezing or shortness of breath. 1 Inhaler 1  . alendronate (FOSAMAX) 70 MG tablet Take 1 tablet (70 mg total) by mouth every 7 (seven) days. Take with a full glass of water on an empty stomach, first thing in the morning and remain up right for 30 minutes after taking. 12 tablet 3  . ALPRAZolam (XANAX) 1 MG tablet TAKE 1 TABLET THREE TIMES DAILY AS NEEDED FOR ANXIETY. 90 tablet 5  . aspirin 81  MG tablet Take 81 mg by mouth at bedtime.    . diclofenac sodium (VOLTAREN) 1 % GEL Apply 2 g topically 3 (three) times daily as needed (pain). 2 Tube 2  . DULoxetine (CYMBALTA) 30 MG capsule TAKE 1 CAPSULE ONCE DAILY ALONG WITH 60MG  CAPSULE FOR TOTAL 90MG  DAILY. 30 capsule 1  . DULoxetine (CYMBALTA) 60 MG capsule TAKE 1 CAPSULE A DAY TO BE COMBINED WITH 30MG  CAPSULE 90 capsule 1  . furosemide (LASIX) 40 MG tablet Take 1 tablet (40 mg total) by mouth daily. 60 tablet 11  . isosorbide mononitrate (IMDUR) 30 MG 24 hr tablet Take 1 tablet (30 mg total) by mouth 2 (two) times daily. 60mg  in the am and 30mg  in the pm 90 tablet 3  . metoprolol succinate (TOPROL XL) 25  MG 24 hr tablet Take 1 tablet (25 mg total) by mouth 2 (two) times daily. 60 tablet 11  . Multiple Vitamin (MULTIVITAMIN WITH MINERALS) TABS tablet Take 1 tablet by mouth daily.    . nitroGLYCERIN (NITROSTAT) 0.4 MG SL tablet Place 1 tablet (0.4 mg total) under the tongue every 5 (five) minutes as needed for chest pain (x 3 doses). 25 tablet 3  . oxyCODONE-acetaminophen (PERCOCET) 10-325 MG tablet Take 1 tablet by mouth every 6 (six) hours as needed for pain. 100 tablet 0  . OXYGEN Inhale 3 L into the lungs continuous.    . pantoprazole (PROTONIX) 40 MG tablet TAKE 1 TABLET DAILY. 90 tablet 3  . polyethylene glycol (MIRALAX / GLYCOLAX) packet Take 17 g by mouth daily as needed (constipation). Mix in 8 oz liquid and drink    . potassium chloride SA (K-DUR,KLOR-CON) 20 MEQ tablet Take 20 mEq by mouth as directed. Take 2 tablets by mouth in the morning and tablet in the evening    . traZODone (DESYREL) 50 MG tablet TAKE 2-4 TABLETS AT BEDTIME 120 tablet 6  . Vitamin D, Ergocalciferol, (DRISDOL) 50000 units CAPS capsule 1 tab po TWICE per week 24 capsule 3  . ARIPiprazole (ABILIFY) 10 MG tablet TAKE 1 TABLET ONCE DAILY. 90 tablet 3   Facility-Administered Medications Prior to Visit  Medication Dose Route Frequency Provider Last Rate Last Dose   . ferumoxytol (FERAHEME) 510 mg in sodium chloride 0.9 % 100 mL IVPB  510 mg Intravenous Once Volanda Napoleon, MD        Allergies  Allergen Reactions  . Penicillins Itching, Swelling and Rash    Tolerated Cefepime in ED. Has patient had a PCN reaction causing immediate rash, facial/tongue/throat swelling, SOB or lightheadedness with hypotension: Yes Has patient had a PCN reaction causing severe rash involving mucus membranes or skin necrosis: No Has patient had a PCN reaction that required hospitalization: No  Has patient had a PCN reaction occurring within the last 10 years: No     ROS As per HPI  PE: Blood pressure 124/66, pulse 85, temperature 97.7 F (36.5 C), temperature source Oral, resp. rate 16, height 5\' 3"  (1.6 m), weight 148 lb (67.1 kg), SpO2 95 %. Gen: Alert, well appearing.  Patient is oriented to person, place, time, and situation. AFFECT: pleasant, lucid thought and speech. Flattened facial expression. No further exam today.  LABS:    Chemistry      Component Value Date/Time   NA 139 10/23/2017 0547   NA 141 06/05/2015 1315   K 3.8 10/23/2017 0547   K 3.9 06/05/2015 1315   CL 101 10/23/2017 0547   CL 103 06/05/2015 1315   CO2 28 10/23/2017 0547   CO2 28 06/05/2015 1315   BUN 19 10/23/2017 0547   BUN 14 06/05/2015 1315   CREATININE 0.74 10/23/2017 0547   CREATININE 0.87 02/03/2017 1523      Component Value Date/Time   CALCIUM 8.5 (L) 10/23/2017 0547   CALCIUM 9.0 06/05/2015 1315   ALKPHOS 53 10/21/2017 0620   ALKPHOS 84 06/05/2015 1315   AST 46 (H) 10/21/2017 0620   AST 23 06/05/2015 1315   ALT 18 10/21/2017 0620   ALT 17 06/05/2015 1315   BILITOT 0.8 10/21/2017 0620   BILITOT 1.00 06/05/2015 1315     Lab Results  Component Value Date   CHOL 152 01/31/2013   HDL 44 01/31/2013   LDLCALC 90 01/31/2013   TRIG 92 01/31/2013  CHOLHDL 3.5 01/31/2013   Lab Results  Component Value Date   HGBA1C 5.6 01/02/2016   IMPRESSION AND PLAN:  1)  Neuroleptic-induced parkinsonism.  2) Recurrent MDD, in partial remission.  Will ween off of abilify over the next 2 wks: 5mg  qd x 7d, then 5mg  every other day x 4 doses. My choice of replacement is lamictal.  I confirmed with Dr. Carles Collet today that this med was ok and she said it was. Plan: start lamictal now at 25mg  once daily dosing x 15d, then increase to 50mg  qd x 15d---and I'll see her back in the office in 1 mo. Pt understands that it could be 6 mo or so before clear improvement in her parkinsonism is seen from being off of her abilify.  An After Visit Summary was printed and given to the patient.  FOLLOW UP: Return in about 4 weeks (around 12/25/2017) for f/u depression med.  Signed:  Crissie Sickles, MD           11/27/2017

## 2017-11-28 DIAGNOSIS — J61 Pneumoconiosis due to asbestos and other mineral fibers: Secondary | ICD-10-CM | POA: Diagnosis not present

## 2017-11-28 DIAGNOSIS — N390 Urinary tract infection, site not specified: Secondary | ICD-10-CM | POA: Diagnosis not present

## 2017-11-28 DIAGNOSIS — I5030 Unspecified diastolic (congestive) heart failure: Secondary | ICD-10-CM | POA: Diagnosis not present

## 2017-11-28 DIAGNOSIS — I2510 Atherosclerotic heart disease of native coronary artery without angina pectoris: Secondary | ICD-10-CM | POA: Diagnosis not present

## 2017-11-28 DIAGNOSIS — B9689 Other specified bacterial agents as the cause of diseases classified elsewhere: Secondary | ICD-10-CM | POA: Diagnosis not present

## 2017-11-28 DIAGNOSIS — I110 Hypertensive heart disease with heart failure: Secondary | ICD-10-CM | POA: Diagnosis not present

## 2017-12-01 DIAGNOSIS — I110 Hypertensive heart disease with heart failure: Secondary | ICD-10-CM | POA: Diagnosis not present

## 2017-12-01 DIAGNOSIS — N390 Urinary tract infection, site not specified: Secondary | ICD-10-CM | POA: Diagnosis not present

## 2017-12-01 DIAGNOSIS — J61 Pneumoconiosis due to asbestos and other mineral fibers: Secondary | ICD-10-CM | POA: Diagnosis not present

## 2017-12-01 DIAGNOSIS — B9689 Other specified bacterial agents as the cause of diseases classified elsewhere: Secondary | ICD-10-CM | POA: Diagnosis not present

## 2017-12-01 DIAGNOSIS — I5030 Unspecified diastolic (congestive) heart failure: Secondary | ICD-10-CM | POA: Diagnosis not present

## 2017-12-01 DIAGNOSIS — I2510 Atherosclerotic heart disease of native coronary artery without angina pectoris: Secondary | ICD-10-CM | POA: Diagnosis not present

## 2017-12-02 ENCOUNTER — Other Ambulatory Visit: Payer: Self-pay | Admitting: Physician Assistant

## 2017-12-03 ENCOUNTER — Telehealth: Payer: Self-pay | Admitting: Neurology

## 2017-12-03 NOTE — Telephone Encounter (Signed)
Orders given to [name] with [facility] to continue PT.

## 2017-12-08 DIAGNOSIS — B9689 Other specified bacterial agents as the cause of diseases classified elsewhere: Secondary | ICD-10-CM | POA: Diagnosis not present

## 2017-12-08 DIAGNOSIS — I5030 Unspecified diastolic (congestive) heart failure: Secondary | ICD-10-CM | POA: Diagnosis not present

## 2017-12-08 DIAGNOSIS — J61 Pneumoconiosis due to asbestos and other mineral fibers: Secondary | ICD-10-CM | POA: Diagnosis not present

## 2017-12-08 DIAGNOSIS — I110 Hypertensive heart disease with heart failure: Secondary | ICD-10-CM | POA: Diagnosis not present

## 2017-12-08 DIAGNOSIS — N390 Urinary tract infection, site not specified: Secondary | ICD-10-CM | POA: Diagnosis not present

## 2017-12-08 DIAGNOSIS — I2510 Atherosclerotic heart disease of native coronary artery without angina pectoris: Secondary | ICD-10-CM | POA: Diagnosis not present

## 2017-12-09 DIAGNOSIS — B9689 Other specified bacterial agents as the cause of diseases classified elsewhere: Secondary | ICD-10-CM | POA: Diagnosis not present

## 2017-12-09 DIAGNOSIS — N390 Urinary tract infection, site not specified: Secondary | ICD-10-CM | POA: Diagnosis not present

## 2017-12-09 DIAGNOSIS — I110 Hypertensive heart disease with heart failure: Secondary | ICD-10-CM | POA: Diagnosis not present

## 2017-12-09 DIAGNOSIS — I2510 Atherosclerotic heart disease of native coronary artery without angina pectoris: Secondary | ICD-10-CM | POA: Diagnosis not present

## 2017-12-09 DIAGNOSIS — J61 Pneumoconiosis due to asbestos and other mineral fibers: Secondary | ICD-10-CM | POA: Diagnosis not present

## 2017-12-09 DIAGNOSIS — I5030 Unspecified diastolic (congestive) heart failure: Secondary | ICD-10-CM | POA: Diagnosis not present

## 2017-12-10 ENCOUNTER — Telehealth: Payer: Self-pay | Admitting: Family Medicine

## 2017-12-10 NOTE — Telephone Encounter (Signed)
Noted  

## 2017-12-10 NOTE — Telephone Encounter (Signed)
Copied from [place] [id number]. Topic: General - Other >> Dec 10, 2017  2:57 PM Darl Householder, RMA wrote: Reason for CRM: Margaretha Sheffield from [facility] called to inform [name] that services are not able to start due to pt being out of town for the holidays until 12/22/2017 then services will resume at once a week x4 weeks for skilled nursing, if you have any questions please feel free to call Margaretha Sheffield at [phone]

## 2017-12-10 NOTE — Telephone Encounter (Signed)
See note

## 2017-12-10 NOTE — Telephone Encounter (Signed)
FYI

## 2017-12-11 ENCOUNTER — Other Ambulatory Visit: Payer: Self-pay

## 2017-12-11 ENCOUNTER — Encounter: Payer: Medicare Other | Attending: Physical Medicine & Rehabilitation | Admitting: Registered Nurse

## 2017-12-11 ENCOUNTER — Encounter: Payer: Self-pay | Admitting: Registered Nurse

## 2017-12-11 VITALS — BP 134/74 | HR 92

## 2017-12-11 DIAGNOSIS — Z5181 Encounter for therapeutic drug level monitoring: Secondary | ICD-10-CM | POA: Insufficient documentation

## 2017-12-11 DIAGNOSIS — M1712 Unilateral primary osteoarthritis, left knee: Secondary | ICD-10-CM | POA: Diagnosis not present

## 2017-12-11 DIAGNOSIS — M5416 Radiculopathy, lumbar region: Secondary | ICD-10-CM

## 2017-12-11 DIAGNOSIS — Z79899 Other long term (current) drug therapy: Secondary | ICD-10-CM

## 2017-12-11 DIAGNOSIS — R2689 Other abnormalities of gait and mobility: Secondary | ICD-10-CM | POA: Diagnosis not present

## 2017-12-11 DIAGNOSIS — G894 Chronic pain syndrome: Secondary | ICD-10-CM

## 2017-12-11 DIAGNOSIS — F329 Major depressive disorder, single episode, unspecified: Secondary | ICD-10-CM | POA: Diagnosis not present

## 2017-12-11 DIAGNOSIS — F411 Generalized anxiety disorder: Secondary | ICD-10-CM | POA: Diagnosis not present

## 2017-12-11 DIAGNOSIS — M797 Fibromyalgia: Secondary | ICD-10-CM | POA: Diagnosis not present

## 2017-12-11 DIAGNOSIS — M1711 Unilateral primary osteoarthritis, right knee: Secondary | ICD-10-CM | POA: Diagnosis not present

## 2017-12-11 DIAGNOSIS — I209 Angina pectoris, unspecified: Secondary | ICD-10-CM

## 2017-12-11 MED ORDER — OXYCODONE-ACETAMINOPHEN 10-325 MG PO TABS: 1.0000 | ORAL_TABLET | Freq: Four times a day (QID) | ORAL | 0 refills | Status: DC | PRN

## 2017-12-11 NOTE — Progress Notes (Signed)
Subjective:    Patient ID: Linda [name], female    DOB: 01-08-1946, 71 y.o.   MRN: [id number]  HPI: [name] is a 71year old female who returns for follow up appointmentfor chronic pain and medication refill. She states her pain is located in herlower back radiating into her bilateral lower extremities  and bilateral knee pain.She rates her pain 5. Her current exercise regime is attending physical therapy twice a week and walking with her walker.   Also reports her PCP/Neurologist believe the Abilify was causing the parkinsonian symptoms, she has been weaned off the Abilify. She is under the care of her neurologist and PCP.   Last UDS was on 06/03/2017, it was consistent.  Pain Inventory Average Pain 7 Pain Right Now 5 My pain is sharp, stabbing and aching  In the last 24 hours, has pain interfered with the following? General activity 10 Relation with others 10 Enjoyment of life 10 What TIME of day is your pain at its worst? daytime evening Sleep (in general) Fair  Pain is worse with: walking, bending, standing and some activites Pain improves with: rest and medication Relief from Meds: 5  Mobility walk with assistance use a walker how many minutes can you walk? 5-10 ability to climb steps?  no do you drive?  yes  Function retired I need assistance with the following:  dressing, meal prep, household duties and shopping  Neuro/Psych bladder control problems weakness trouble walking spasms anxiety  Prior Studies Any changes since last visit?  no  Physicians involved in your care Any changes since last visit?  no   Family History  Problem Relation Age of Onset  . Arthritis Mother   . Kidney disease Mother   . Heart disease Father   . Stroke Father   . Hypertension Father   . Diabetes Father   . Heart attack Father   . Heart attack Paternal Grandmother   . Diabetes Sister        one sister  . Hypertension Sister   . Hypertension  Brother   . Multiple sclerosis Son    Social History   Socioeconomic History  . Marital status: Widowed    Spouse name: Not on file  . Number of children: 2  . Years of education: Not on file  . Highest education level: Not on file  Social Needs  . Financial resource strain: Not on file  . Food insecurity - worry: Not on file  . Food insecurity - inability: Not on file  . Transportation needs - medical: Not on file  . Transportation needs - non-medical: Not on file  Occupational History  . 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 Smoking status: Never Smoker  . Smokeless tobacco: Never Used  . Tobacco comment: never used tobacco  Substance and Sexual Activity  . Alcohol use: No    Alcohol/week: 0.0 oz  . Drug use: No  . Sexual activity: Not Currently  Other Topics Concern  . Not on file  Social History Narrative   Widowed, 2 sons.  Relocated to [place] 09/2012 to be closer to her son who has MS.   Husband d 2015--mesothelioma.   Occupation: former Pharmacist, hospital.   Education: masters degree level.   No T/A/Ds.   Lives in [place], independent living.   Past Surgical History:  Procedure Laterality Date  . APPENDECTOMY  1960  . AXILLARY SURGERY Left 1978   Multiple "  lump" in armpit per pt  . CARDIAC CATHETERIZATION  01/2013   nonobstructive CAD, EF 55-60%  . CARDIOVASCULAR STRESS TEST  02/22/15   Low risk myocard perf imaging; wall motion normal, normal EF  . carotid duplex doppler  10/21/2017   R vertebral flow suggestive of possible distal obstruction.  Pt declined further w/u as of 10/29/17 but need to revisit this problem periodically.  . COCCYX REMOVAL  1972  . DEXA  06/05/2017   T-score -3.1  . DILATION AND CURETTAGE OF UTERUS  ? 1970's  . ESOPHAGOGASTRODUODENOSCOPY N/A 07/19/2014   Gastritis found + in the setting of supratherapeutic INR, +plavix, + meloxicam.  . EYE SURGERY Left 2012-2013   "injections for ~ 1 yr; don't really know what  for" (07/12/2013)  . HEEL SPUR SURGERY Left 2008  . KNEE SURGERY  2005  . LEFT HEART CATHETERIZATION WITH CORONARY ANGIOGRAM N/A 01/30/2013   Procedure: LEFT HEART CATHETERIZATION WITH CORONARY ANGIOGRAM;  Surgeon: Clent Demark, MD;  Location: [facility] CATH LAB;  Service: Cardiovascular;  Laterality: N/A;  . PLANTAR FASCIA RELEASE Left 2008  . SPIROMETRY  04/25/14   In hosp for acute asthma/COPD flare: mixed obstructive and restrictive lung disease. The FEV1 is severely reduced at 45% predicted.  FEV1 signif decreased compared to prior spirometry 07/23/13.  . TENDON RELEASE  1996   Right forearm and hand  . TOTAL ABDOMINAL HYSTERECTOMY  1974  . TRANSTHORACIC ECHOCARDIOGRAM  01/2013; 04/2014;08/2015; 09/2017   2014--NORMAL.  2015--focal basal septal hypertrophy, EF 55-60%, grade I diast dysfxn, mild LAE.  08/2015 EF 55-60%, nl LV syst fxn, grade I DD, valves wnl. 10/21/17: EF 65-70%, grd I DD, o/w normal.   Past Medical History:  Diagnosis Date  . Acute upper GI bleed 06/2014   while pt taking coumadin, plavix, and meloxicam---despite being told not to take coumadin.  . Anginal pain ([place])    Nonobstructive CAD 2014; however, her cardiologist put her on a statin for this and NOT for hyperlipidemia per pt report.  Atyp CP 08/2017 at card f/u, plan for myoc perf imaging.  . Anxiety    panic attacks  . Asthma    w/ asbestososis   . BPPV (benign paroxysmal positional vertigo) 12/16/2012  . Chronic diastolic CHF (congestive heart failure) (HCC)    dry wt as of 11/06/16 is 168 lbs.  . Chronic lower back pain   . COPD (chronic obstructive pulmonary disease) ([place])   . DDD (degenerative disc disease)    lumbar and cervical.   . Diverticular disease   . Fibromyalgia    Patient states dx was around her late 35s but she had sx's for years prior to this.  . H/O hiatal hernia   . Hay fever   . History of pneumonia    hospitalized 12/2011, 02/2013, and 07/2013 [facility]) for this  . HTN (hypertension)   . Idiopathic  angio-edema-urticaria 72014   Angioedema component was very minimal  . Insomnia   . Iron deficiency anemia    Hematologist in [place], [place] did extensive w/u; no cause found; failed oral supplement;; gets fairly regular (q43m or so) IV iron infusions (Venofer -iron sucrose- 200mg  with procrit.  "for 14 yr I've been getting blood work q month & getting infusions prn" (07/12/2013).  Dr. Marin Olp locally, iron infusions done, EPO deficiency dx'd  . Migraine syndrome    "not as often anymore; used to be ~ q wk" (07/12/2013)  . Mixed incontinence urge and stress   . Nephrolithiasis    "  passed all on my own or they are still in there" (07/12/2013)  . OSA on CPAP    prior to move to [place]--had another sleep study 10/2015 w/pulm Dr. Camillo Flaming.  . Osteoarthritis    "severe; progressing fast" (07/12/2013); multiple joints-not surgical candidate for TKR (03/2015)  . Parkinsonism [facility]) 2018   Dr. Carles Collet, neuro, saw her 11/24/17 and recommended d/c of abilify as first step.   [name] Kitchen Pernicious anemia 08/24/2014  . Pleural plaque with presence of asbestos 07/22/2013  . Pulmonary embolism ([place]) 07/2013   Dx at [facility] with very small peripheral upper lobe pe 07/2013: pt took coumadin for about 8-9 mo  . Pyelonephritis    "several times over the last 30 yr" (07/12/2013)  . RBBB (right bundle branch block)   . Recurrent major depression ([place])   . Recurrent UTI    hx of hospitalization for pyelonephritis; started abx prophylaxis 06/2015  . Syncope    Hypotensive; ED visit--Dr. Terrence Dupont did Cath--nonobstructive CAD, EF 55-60%.  In retrospect, suspect pt rx med misuse/polypharmacy  . Tension headache, chronic    There were no vitals taken for this visit.  Opioid Risk Score:  1 Fall Risk Score:  `1  Depression screen PHQ 2/9  Depression screen [facility] 2/9 12/11/2017 11/17/2017 10/20/2017 09/17/2017 08/18/2017 07/21/2017 05/06/2017  Decreased Interest 1 1 1 1  0 0 0  Down, Depressed, Hopeless 1 1 1 1 3 3 3   PHQ - 2 Score 2 2 2 2 3 3 3     Altered sleeping - - - - - - -  Tired, decreased energy - - - - - - -  Change in appetite - - - - - - -  Feeling bad or failure about yourself  - - - - - - -  Trouble concentrating - - - - - - -  Moving slowly or fidgety/restless - - - - - - -  Suicidal thoughts - - - - - - -  PHQ-9 Score - - - - - - -  Some recent data might be hidden    Review of Systems  Constitutional: Positive for diaphoresis.  HENT: Negative.   Eyes: Negative.   Respiratory: Positive for cough and shortness of breath.   Cardiovascular: Negative.   Gastrointestinal: Positive for constipation and nausea.  Endocrine: Negative.   Genitourinary:       Bladder control  Musculoskeletal: Positive for gait problem.  Skin: Negative.   Allergic/Immunologic: Negative.   Neurological: Positive for weakness.  Hematological: Negative.   Psychiatric/Behavioral: The patient is nervous/anxious.   All other systems reviewed and are negative.      Objective:   Physical Exam  Constitutional: She is oriented to person, place, and time. She appears well-developed and well-nourished.  HENT:  Head: Normocephalic and atraumatic.  Neck: Normal range of motion. Neck supple.  Cardiovascular: Normal rate and regular rhythm.  Pulmonary/Chest: Effort normal and breath sounds normal.  Musculoskeletal:  Normal Muscle Bulk and Muscle Testing Reveals: Upper Extremities: Full ROM and Muscle Strength 5/5 Thoracic Paraspinal Tenderness: T-7-T-9 Lumbar Paraspinal Tenderness: L-3-L-5 Lower Extremities: Decreased ROM and Muscle Strength 5/5 Bilateral Lower Extremities Flexion Produces Pain into Bilateral Patella's Arises from Table Slowly using walker for support. Narrow Based Gait  Neurological: She is alert and oriented to person, place, and time.  Skin: Skin is warm and dry.  Psychiatric: She has a normal mood and affect.  Nursing note and vitals reviewed.         Assessment & Plan:  1. Functional deficits secondary to  Gait disorder:Continue with HEP.12/11/2017 2. Chronic Back pain/ Lumbar Radiculitis/fibromyalgia /R>L Knee OA Pain Management: 12/11/2017 Refilled: Oxycodone 10/325mg one tablet every 6 hours #100 and Voltaren Gel We will continue the opioid monitoring program, this consists of regular clinic visits, examinations, urine drug screen, pill counts as well as use of [place] Controlled Substance reporting System. 3. Depression with anxiety/Grief reaction/Mood: 12/11/2017 Continue Xanax,Trazodone and Cymbalta .  4. [name] with asthma: Oxygen dependent. Albuterol prn.  On Continuous Oxygen Therapy:Pulmonology Following. 12/11/2017 5. Bilateral Osteoarthritis of Bilateral Knees: Schedule for Zilretta Injection with Dr. Naaman Plummer. Dr. Juanda Bond Orthopedist.. 12/11/2017 6. Fibromyalgia: Continue Home exercise regime. 12/11/2017 7. Deconditioning: Continue Physical Therapy. 12/11/2017  20 minutes of face to face patient care time was spent during this visit. All questions were encouraged and answered. F/U in 1 month

## 2017-12-15 LAB — DRUG TOX MONITOR 1 W/CONF, ORAL FLD
AMPHETAMINES: NEGATIVE ng/mL (ref <10)
BARBITURATES: NEGATIVE ng/mL (ref <10)
BENZODIAZEPINES: NEGATIVE ng/mL (ref <0.50)
BUPRENORPHINE: NEGATIVE ng/mL (ref <0.10)
CHLORDIAZEPOXIDE: NEGATIVE ng/mL (ref <0.50)
CLONAZEPAM: NEGATIVE ng/mL (ref <0.50)
CODEINE: NEGATIVE ng/mL (ref <2.5)
Cocaine: NEGATIVE ng/mL (ref <5.0)
DIAZEPAM: NEGATIVE ng/mL (ref <0.50)
Dihydrocodeine: NEGATIVE ng/mL (ref <2.5)
FLUNITRAZEPAM: NEGATIVE ng/mL (ref <0.50)
FLURAZEPAM: NEGATIVE ng/mL (ref <0.50)
Fentanyl: NEGATIVE ng/mL (ref <0.10)
Heroin Metabolite: NEGATIVE ng/mL (ref <1.0)
Hydrocodone: NEGATIVE ng/mL (ref <2.5)
Hydromorphone: NEGATIVE ng/mL (ref <2.5)
Lorazepam: NEGATIVE ng/mL (ref <0.50)
MARIJUANA: NEGATIVE ng/mL (ref <2.5)
MDMA: NEGATIVE ng/mL (ref <10)
MEPROBAMATE: NEGATIVE ng/mL (ref <2.5)
METHADONE: NEGATIVE ng/mL (ref <5.0)
Midazolam: NEGATIVE ng/mL (ref <0.50)
Morphine: NEGATIVE ng/mL (ref <2.5)
NORDIAZEPAM: NEGATIVE ng/mL (ref <0.50)
NORHYDROCODONE: NEGATIVE ng/mL (ref <2.5)
NOROXYCODONE: 10.9 ng/mL — AB (ref <2.5)
Nicotine Metabolite: NEGATIVE ng/mL (ref <5.0)
OXAZEPAM: NEGATIVE ng/mL (ref <0.50)
OXYCODONE: 22.6 ng/mL — AB (ref <2.5)
Opiates: POSITIVE ng/mL — AB (ref <2.5)
Oxymorphone: NEGATIVE ng/mL (ref <2.5)
Phencyclidine: NEGATIVE ng/mL (ref <10)
TAPENTADOL: NEGATIVE ng/mL (ref <5.0)
TRIAZOLAM: NEGATIVE ng/mL (ref <0.50)
Temazepam: NEGATIVE ng/mL (ref <0.50)
Tramadol: NEGATIVE ng/mL (ref <5.0)
Zolpidem: NEGATIVE ng/mL (ref <5.0)

## 2017-12-15 LAB — DRUG TOX ALC METAB W/CON, ORAL FLD: Alcohol Metabolite: NEGATIVE ng/mL (ref <25)

## 2017-12-17 DIAGNOSIS — H1033 Unspecified acute conjunctivitis, bilateral: Secondary | ICD-10-CM | POA: Diagnosis not present

## 2017-12-17 DIAGNOSIS — H16223 Keratoconjunctivitis sicca, not specified as Sjogren's, bilateral: Secondary | ICD-10-CM | POA: Diagnosis not present

## 2017-12-24 ENCOUNTER — Encounter: Payer: Medicare Other | Admitting: Physical Medicine & Rehabilitation

## 2017-12-25 ENCOUNTER — Encounter: Payer: Self-pay | Admitting: Family Medicine

## 2017-12-25 ENCOUNTER — Telehealth: Payer: Self-pay | Admitting: Family Medicine

## 2017-12-25 ENCOUNTER — Ambulatory Visit (INDEPENDENT_AMBULATORY_CARE_PROVIDER_SITE_OTHER): Payer: Medicare Other | Admitting: Family Medicine

## 2017-12-25 VITALS — BP 126/77 | HR 79 | Temp 98.1°F | Resp 16 | Ht 63.0 in | Wt 140.2 lb

## 2017-12-25 DIAGNOSIS — J209 Acute bronchitis, unspecified: Secondary | ICD-10-CM

## 2017-12-25 DIAGNOSIS — F330 Major depressive disorder, recurrent, mild: Secondary | ICD-10-CM | POA: Diagnosis not present

## 2017-12-25 DIAGNOSIS — R5381 Other malaise: Secondary | ICD-10-CM

## 2017-12-25 DIAGNOSIS — G894 Chronic pain syndrome: Secondary | ICD-10-CM | POA: Diagnosis not present

## 2017-12-25 DIAGNOSIS — J0100 Acute maxillary sinusitis, unspecified: Secondary | ICD-10-CM

## 2017-12-25 DIAGNOSIS — R269 Unspecified abnormalities of gait and mobility: Secondary | ICD-10-CM | POA: Diagnosis not present

## 2017-12-25 DIAGNOSIS — G2111 Neuroleptic induced parkinsonism: Secondary | ICD-10-CM | POA: Diagnosis not present

## 2017-12-25 MED ORDER — LAMOTRIGINE 25 MG PO TBDP: ORAL_TABLET | ORAL | 0 refills | Status: DC

## 2017-12-25 MED ORDER — AZITHROMYCIN 250 MG PO TABS: ORAL_TABLET | ORAL | 0 refills | Status: DC

## 2017-12-25 MED ORDER — PREDNISONE 20 MG PO TABS: ORAL_TABLET | ORAL | 0 refills | Status: DC

## 2017-12-25 NOTE — Patient Instructions (Signed)
Get otc generic robitussin DM OR Mucinex DM and use as directed on the packaging for cough and congestion. Use otc generic saline nasal spray 2-3 times per day to irrigate/moisturize your nasal passages.   

## 2017-12-25 NOTE — Progress Notes (Addendum)
OFFICE VISIT  12/25/2017   CC:  Chief Complaint  Patient presents with  . Cough  . Congestion    Nasal and Chest  . Follow-up    new med   HPI:    Patient is a 72 y.o. Caucasian female who presents for respiratory complaints. Onset of nasal congestion/runny nose, eyes watering, sneezing, coughing about 6 days ago. Tmax 100.  Rattly chest but no wheezing.  Has not been requiring any oxygen. +ST and HA.  Also some body aches. Sx's not improving.  Psych: due to parkinsonism on abilify, we weened her off this at last visit and started her on lamictal 25mg  qd and she is tolerating this well.  She did not increase lamictal to 50mg  after 2 wks as instructed b/c she "forgot". No improvement or worsening of mood.  I have reviewed her issues regarding chronic pain (physical med and rehab f/u regularly), disability due to chronic debilitated state, chronic hypoxemic resp failure, and gait abnormality.  She is unsafe to leave him unassisted and I do feel like ongoing [place] and PT/OT are needed on a chronic basis.  ROS: no rash, no n/v/d.  NO SI or HI.  No hallucinations or delusions.  Past Medical History:  Diagnosis Date  . Acute upper GI bleed 06/2014   while pt taking coumadin, plavix, and meloxicam---despite being told not to take coumadin.  . Anginal pain ([place])    Nonobstructive CAD 2014; however, her cardiologist put her on a statin for this and NOT for hyperlipidemia per pt report.  Atyp CP 08/2017 at card f/u, plan for myoc perf imaging.  . Anxiety    panic attacks  . Asthma    w/ asbestososis   . BPPV (benign paroxysmal positional vertigo) 12/16/2012  . Chronic diastolic CHF (congestive heart failure) (HCC)    dry wt as of 11/06/16 is 168 lbs.  . Chronic lower back pain   . COPD (chronic obstructive pulmonary disease) ([place])   . DDD (degenerative disc disease)    lumbar and cervical.   . Diverticular disease   . Fibromyalgia    Patient states dx was around her late 54s  but she had sx's for years prior to this.  . H/O hiatal hernia   . Hay fever   . History of pneumonia    hospitalized 12/2011, 02/2013, and 07/2013 [facility]) for this  . HTN (hypertension)   . Idiopathic angio-edema-urticaria 72014   Angioedema component was very minimal  . Insomnia   . Iron deficiency anemia    Hematologist in [place], [place] did extensive w/u; no cause found; failed oral supplement;; gets fairly regular (q51m or so) IV iron infusions (Venofer -iron sucrose- 200mg  with procrit.  "for 14 yr I've been getting blood work q month & getting infusions prn" (07/12/2013).  Dr. Marin Olp locally, iron infusions done, EPO deficiency dx'd  . Migraine syndrome    "not as often anymore; used to be ~ q wk" (07/12/2013)  . Mixed incontinence urge and stress   . Nephrolithiasis    "passed all on my own or they are still in there" (07/12/2013)  . OSA on CPAP    prior to move to [place]--had another sleep study 10/2015 w/pulm Dr. Camillo Flaming.  . Osteoarthritis    "severe; progressing fast" (07/12/2013); multiple joints-not surgical candidate for TKR (03/2015)  . Parkinsonism [facility]) 2018   Dr. Carles Collet, neuro, saw her 11/24/17 and recommended d/c of abilify as first step.   [name] Kitchen Pernicious anemia 08/24/2014  .  Pleural plaque with presence of asbestos 07/22/2013  . Pulmonary embolism ([place]) 07/2013   Dx at [facility] with very small peripheral upper lobe pe 07/2013: pt took coumadin for about 8-9 mo  . Pyelonephritis    "several times over the last 30 yr" (07/12/2013)  . RBBB (right bundle branch block)   . Recurrent major depression ([place])   . Recurrent UTI    hx of hospitalization for pyelonephritis; started abx prophylaxis 06/2015  . Syncope    Hypotensive; ED visit--Dr. Terrence Dupont did Cath--nonobstructive CAD, EF 55-60%.  In retrospect, suspect pt rx med misuse/polypharmacy  . Tension headache, chronic     Past Surgical History:  Procedure Laterality Date  . APPENDECTOMY  1960  . AXILLARY SURGERY Left 1978   Multiple "lump" in  armpit per pt  . CARDIAC CATHETERIZATION  01/2013   nonobstructive CAD, EF 55-60%  . CARDIOVASCULAR STRESS TEST  02/22/15   Low risk myocard perf imaging; wall motion normal, normal EF  . carotid duplex doppler  10/21/2017   R vertebral flow suggestive of possible distal obstruction.  Pt declined further w/u as of 10/29/17 but need to revisit this problem periodically.  . COCCYX REMOVAL  1972  . DEXA  06/05/2017   T-score -3.1  . DILATION AND CURETTAGE OF UTERUS  ? 1970's  . ESOPHAGOGASTRODUODENOSCOPY N/A 07/19/2014   Gastritis found + in the setting of supratherapeutic INR, +plavix, + meloxicam.  . EYE SURGERY Left 2012-2013   "injections for ~ 1 yr; don't really know what for" (07/12/2013)  . HEEL SPUR SURGERY Left 2008  . KNEE SURGERY  2005  . LEFT HEART CATHETERIZATION WITH CORONARY ANGIOGRAM N/A 01/30/2013   Procedure: LEFT HEART CATHETERIZATION WITH CORONARY ANGIOGRAM;  Surgeon: Clent Demark, MD;  Location: [facility] C[facility] LAB;  Service: Cardiovascular;  Laterality: N/A;  . PLANTAR FASCIA RELEASE Left 2008  . SPIROMETRY  04/25/14   In hosp for acute asthma/COPD flare: mixed obstructive and restrictive lung disease. The FEV1 is severely reduced at 45% predicted.  FEV1 signif decreased compared to prior spirometry 07/23/13.  . TENDON RELEASE  1996   Right forearm and hand  . TOTAL ABDOMINAL HYSTERECTOMY  1974  . TRANSTHORACIC ECHOCARDIOGRAM  01/2013; 04/2014;08/2015; 09/2017   2014--NORMAL.  2015--focal basal septal hypertrophy, EF 55-60%, grade I diast dysfxn, mild LAE.  08/2015 EF 55-60%, nl LV syst fxn, grade I DD, valves wnl. 10/21/17: EF 65-70%, grd I DD, o/w normal.    Outpatient Medications Prior to Visit  Medication Sig Dispense Refill  . albuterol (PROVENTIL HFA;VENTOLIN HFA) 108 (90 BASE) MCG/ACT inhaler Inhale 2 puffs into the lungs every 6 (six) hours as needed for wheezing or shortness of breath. 1 Inhaler 1  . alendronate (FOSAMAX) 70 MG tablet Take 1 tablet (70 mg total) by mouth every  7 (seven) days. Take with a full glass of water on an empty stomach, first thing in the morning and remain up right for 30 minutes after taking. 12 tablet 3  . ALPRAZolam (XANAX) 1 MG tablet TAKE 1 TABLET THREE TIMES DAILY AS NEEDED FOR ANXIETY. 90 tablet 5  . aspirin 81 MG tablet Take 81 mg by mouth at bedtime.    . diclofenac sodium (VOLTAREN) 1 % GEL Apply 2 g topically 3 (three) times daily as needed (pain). 2 Tube 2  . DULoxetine (CYMBALTA) 30 MG capsule TAKE 1 CAPSULE ONCE DAILY ALONG WITH 60MG  CAPSULE FOR TOTAL 90MG  DAILY. 30 capsule 1  . DULoxetine (CYMBALTA) 60 MG capsule TAKE  1 CAPSULE A DAY TO BE COMBINED WITH 30MG  CAPSULE 90 capsule 1  . furosemide (LASIX) 40 MG tablet Take 1 tablet (40 mg total) by mouth daily. 60 tablet 11  . isosorbide mononitrate (IMDUR) 30 MG 24 hr tablet Take 1 tablet (30 mg total) by mouth 2 (two) times daily. 60mg  in the am and 30mg  in the pm 90 tablet 3  . Multiple Vitamin (MULTIVITAMIN WITH MINERALS) TABS tablet Take 1 tablet by mouth daily.    . nitroGLYCERIN (NITROSTAT) 0.4 MG SL tablet Place 1 tablet (0.4 mg total) under the tongue every 5 (five) minutes as needed for chest pain (x 3 doses). 25 tablet 3  . oxyCODONE-acetaminophen (PERCOCET) 10-325 MG tablet Take 1 tablet by mouth every 6 (six) hours as needed for pain. 100 tablet 0  . OXYGEN Inhale 3 L into the lungs continuous.    . pantoprazole (PROTONIX) 40 MG tablet TAKE 1 TABLET DAILY. 90 tablet 3  . polyethylene glycol (MIRALAX / GLYCOLAX) packet Take 17 g by mouth daily as needed (constipation). Mix in 8 oz liquid and drink    . potassium chloride SA (K-DUR,KLOR-CON) 20 MEQ tablet Take 20 mEq by mouth as directed. Take 2 tablets by mouth in the morning and tablet in the evening    . traZODone (DESYREL) 50 MG tablet TAKE 2-4 TABLETS AT BEDTIME 120 tablet 6  . Vitamin D, Ergocalciferol, (DRISDOL) 50000 units CAPS capsule 1 tab po TWICE per week 24 capsule 3   Facility-Administered Medications Prior to  Visit  Medication Dose Route Frequency Provider Last Rate Last Dose  . ferumoxytol (FERAHEME) 510 mg in sodium chloride 0.9 % 100 mL IVPB  510 mg Intravenous Once Volanda Napoleon, MD        Allergies  Allergen Reactions  . Abilify [Aripiprazole] Other (See Comments)    parkinsonism  . Penicillins Itching, Swelling and Rash    Tolerated Cefepime in ED. Has patient had a PCN reaction causing immediate rash, facial/tongue/throat swelling, SOB or lightheadedness with hypotension: Yes Has patient had a PCN reaction causing severe rash involving mucus membranes or skin necrosis: No Has patient had a PCN reaction that required hospitalization: No  Has patient had a PCN reaction occurring within the last 10 years: No     ROS As per HPI  PE: Blood pressure 126/77, pulse 79, temperature 98.1 F (36.7 C), temperature source Oral, resp. rate 16, height 5\' 3"  (1.6 m), weight 140 lb 4 oz (63.6 kg), SpO2 95 %. VS: noted--normal. Gen: alert, NAD, NONTOXIC APPEARING. HEENT: eyes without injection, drainage, or swelling.  Ears: EACs clear, TMs with normal light reflex and landmarks.  Nose: Clear rhinorrhea, with some dried, crusty exudate adherent to mildly injected mucosa.  No purulent d/c.  Bilat maxillary sinus TTP, R>L.[name] Kitchen  No facial swelling.  Throat and mouth without focal lesion.  No pharyngial swelling, erythema, or exudate.   Neck: supple, no LAD.   LUNGS: CTA bilat, nonlabored resps.  She does cough some with a forced expiration. CV: RRR, no m/r/g. EXT: no c/c/ SKIN: no rash    LABS:    Chemistry      Component Value Date/Time   NA 139 10/23/2017 0547   NA 141 06/05/2015 1315   K 3.8 10/23/2017 0547   K 3.9 06/05/2015 1315   CL 101 10/23/2017 0547   CL 103 06/05/2015 1315   CO2 28 10/23/2017 0547   CO2 28 06/05/2015 1315   BUN 19 10/23/2017 0547  BUN 14 06/05/2015 1315   CREATININE 0.74 10/23/2017 0547   CREATININE 0.87 02/03/2017 1523      Component Value Date/Time    CALCIUM 8.5 (L) 10/23/2017 0547   CALCIUM 9.0 06/05/2015 1315   ALKPHOS 53 10/21/2017 0620   ALKPHOS 84 06/05/2015 1315   AST 46 (H) 10/21/2017 0620   AST 23 06/05/2015 1315   ALT 18 10/21/2017 0620   ALT 17 06/05/2015 1315   BILITOT 0.8 10/21/2017 0620   BILITOT 1.00 06/05/2015 1315     Lab Results  Component Value Date   WBC 6.3 10/22/2017   HGB 12.4 10/22/2017   HCT 38.8 10/22/2017   MCV 90.7 10/22/2017   PLT 185 10/22/2017    IMPRESSION AND PLAN:  1) Acute sinusitis with acute bronchitis, possible bacterial etiology. Z-pack. Prednisone 40mg  qd x 5d. Get otc generic robitussin DM OR Mucinex DM and use as directed on the packaging for cough and congestion. Use otc generic saline nasal spray 2-3 times per day to irrigate/moisturize your nasal passages.  2) Recurrent MDD, treatment resistant.  Good response to abilify but this ended up causing parkinsonism.  She is off abilify and tolerating low dose lamictal.  Will increase lamictal to 50mg  qd x 15d, then increase to 100 mg qd x 15d.  3) Chronic debilitation, unsafe to leave home unattended/unassisted.  Continue with home health nursing, PT, and OT.  An After Visit Summary was printed and given to the patient.  FOLLOW UP: Return in about 4 weeks (around 01/22/2018) for f/u psych med.  Signed:  Crissie Sickles, MD           12/25/2017

## 2017-12-25 NOTE — Telephone Encounter (Signed)
Copied from [place] [id number]. Topic: Quick Communication - See Telephone Encounter >> Dec 25, 2017  4:53 PM Ivar Drape wrote: CRM for notification. See Telephone encounter for:  12/25/17. Loleta Books w/[facility] [phone] were treating the patient and was close to the end of the certification period. She would have benefited from continuing the therapy, however she went on vacation and became sick and they were unable to reach her for a visit until after the window of allowance, which was last night.  To restart home health services, they will need a new referral. Please fax referral to [phone].

## 2017-12-26 NOTE — Telephone Encounter (Signed)
Please advise. Thanks.  

## 2017-12-28 NOTE — Telephone Encounter (Signed)
Can you call [facility] and just see if we can give a verbal order for reinstating her [facility] services? If not, check and see what she was receiving so I can order it correctly in EPIC (nursing? PT? OT?).-thx

## 2017-12-29 ENCOUNTER — Telehealth: Payer: Self-pay | Admitting: *Deleted

## 2017-12-29 ENCOUNTER — Ambulatory Visit: Payer: Medicare Other | Admitting: Family Medicine

## 2017-12-29 ENCOUNTER — Other Ambulatory Visit: Payer: Self-pay | Admitting: Family Medicine

## 2017-12-29 DIAGNOSIS — J9611 Chronic respiratory failure with hypoxia: Secondary | ICD-10-CM

## 2017-12-29 DIAGNOSIS — G894 Chronic pain syndrome: Secondary | ICD-10-CM

## 2017-12-29 DIAGNOSIS — R5381 Other malaise: Secondary | ICD-10-CM

## 2017-12-29 DIAGNOSIS — R269 Unspecified abnormalities of gait and mobility: Secondary | ICD-10-CM

## 2017-12-29 NOTE — Telephone Encounter (Signed)
Linda [name] calling back and stating the will need referral faxed to  [phone]

## 2017-12-29 NOTE — Telephone Encounter (Signed)
Left message for Linda [name] with [facility] to call back.  Okay for PEC to find out if we can give verbal order for reinstating pts [facility] services? If note, check and see what she was receiving so we can order if correctly in Epic (nursing?PT?OT?).

## 2017-12-29 NOTE — Telephone Encounter (Signed)
Oral swab drug screen was consistent for prescribed medications.  ?

## 2017-12-29 NOTE — Telephone Encounter (Signed)
Left message for Clair Gulling to call back.  Need to know what type of therapy pt was receiving and what was it for.

## 2017-12-29 NOTE — Telephone Encounter (Signed)
I ordered this in EPIC. Can you fax the order as requested??

## 2017-12-29 NOTE — Telephone Encounter (Signed)
Linda [name] from [facility] called stating they need referral for all- PT for functional mobility, OT for ADL's and also nursing for disease and medication management.   Orders will need to be sent as a referral.  Linda [name] was unsure of orders just being in Epic, need to be faxed as well please )  Fax number is [phone].  Please advise.

## 2017-12-30 ENCOUNTER — Telehealth: Payer: Self-pay | Admitting: Family Medicine

## 2017-12-30 NOTE — Telephone Encounter (Signed)
Patient aware that Dr Anitra Lauth is not in office this afternoon.  Patient has not tried anything OTC because she can't get to pharmacy but her pharmacy delivers.  Please advise.

## 2017-12-30 NOTE — Telephone Encounter (Signed)
Copied from [place]. Topic: Quick Communication - See Telephone Encounter >> Dec 30, 2017  2:54 PM Vernona Rieger wrote: CRM for notification. See Telephone encounter for:   12/30/17.  Pt states she was in the office last week for a sinus infection, she said she needs something for the cough & a decongestant  [place], [place] She would like the medicine delivered to her if he will scribe her something

## 2017-12-30 NOTE — Telephone Encounter (Signed)
Noted  

## 2017-12-30 NOTE — Telephone Encounter (Signed)
Order faxed.

## 2017-12-31 NOTE — Telephone Encounter (Signed)
Left detailed message on pt's cell to use saline nasal spray frequently and to get OTC mucinex DM.      **Okay for PEC to advise if she calls back.

## 2017-12-31 NOTE — Telephone Encounter (Signed)
Tell her that due to the other meds she is on and the chronic medical problems that she has, the only thing I can recommend to her for these symptoms is mucinex DM and frequent use of nasal saline spray. -thx

## 2018-01-01 ENCOUNTER — Inpatient Hospital Stay (HOSPITAL_COMMUNITY)
Admission: EM | Admit: 2018-01-01 | Discharge: 2018-01-06 | DRG: 194 | Disposition: A | Discharge status: Home Health | Payer: Medicare Other | Attending: Internal Medicine | Admitting: Internal Medicine

## 2018-01-01 ENCOUNTER — Emergency Department (HOSPITAL_COMMUNITY): Payer: Medicare Other

## 2018-01-01 ENCOUNTER — Other Ambulatory Visit: Payer: Self-pay

## 2018-01-01 ENCOUNTER — Telehealth: Payer: Self-pay

## 2018-01-01 ENCOUNTER — Encounter (HOSPITAL_COMMUNITY): Payer: Self-pay

## 2018-01-01 DIAGNOSIS — I9589 Other hypotension: Secondary | ICD-10-CM

## 2018-01-01 DIAGNOSIS — Z87440 Personal history of urinary (tract) infections: Secondary | ICD-10-CM | POA: Diagnosis not present

## 2018-01-01 DIAGNOSIS — G4700 Insomnia, unspecified: Secondary | ICD-10-CM | POA: Diagnosis present

## 2018-01-01 DIAGNOSIS — B9620 Unspecified Escherichia coli [E. coli] as the cause of diseases classified elsewhere: Secondary | ICD-10-CM | POA: Diagnosis present

## 2018-01-01 DIAGNOSIS — N179 Acute kidney failure, unspecified: Secondary | ICD-10-CM | POA: Diagnosis not present

## 2018-01-01 DIAGNOSIS — E860 Dehydration: Secondary | ICD-10-CM | POA: Diagnosis not present

## 2018-01-01 DIAGNOSIS — J9611 Chronic respiratory failure with hypoxia: Secondary | ICD-10-CM | POA: Diagnosis not present

## 2018-01-01 DIAGNOSIS — I5032 Chronic diastolic (congestive) heart failure: Secondary | ICD-10-CM | POA: Diagnosis present

## 2018-01-01 DIAGNOSIS — R627 Adult failure to thrive: Secondary | ICD-10-CM | POA: Diagnosis not present

## 2018-01-01 DIAGNOSIS — I110 Hypertensive heart disease with heart failure: Secondary | ICD-10-CM | POA: Diagnosis present

## 2018-01-01 DIAGNOSIS — J440 Chronic obstructive pulmonary disease with acute lower respiratory infection: Secondary | ICD-10-CM | POA: Diagnosis present

## 2018-01-01 DIAGNOSIS — G4733 Obstructive sleep apnea (adult) (pediatric): Secondary | ICD-10-CM | POA: Diagnosis present

## 2018-01-01 DIAGNOSIS — J101 Influenza due to other identified influenza virus with other respiratory manifestations: Secondary | ICD-10-CM | POA: Diagnosis not present

## 2018-01-01 DIAGNOSIS — Z86711 Personal history of pulmonary embolism: Secondary | ICD-10-CM

## 2018-01-01 DIAGNOSIS — Z9981 Dependence on supplemental oxygen: Secondary | ICD-10-CM | POA: Diagnosis not present

## 2018-01-01 DIAGNOSIS — R634 Abnormal weight loss: Secondary | ICD-10-CM | POA: Diagnosis present

## 2018-01-01 DIAGNOSIS — E876 Hypokalemia: Secondary | ICD-10-CM | POA: Diagnosis not present

## 2018-01-01 DIAGNOSIS — J209 Acute bronchitis, unspecified: Secondary | ICD-10-CM | POA: Diagnosis not present

## 2018-01-01 DIAGNOSIS — N390 Urinary tract infection, site not specified: Secondary | ICD-10-CM | POA: Diagnosis present

## 2018-01-01 DIAGNOSIS — J9610 Chronic respiratory failure, unspecified whether with hypoxia or hypercapnia: Secondary | ICD-10-CM | POA: Diagnosis present

## 2018-01-01 DIAGNOSIS — F329 Major depressive disorder, single episode, unspecified: Secondary | ICD-10-CM | POA: Diagnosis present

## 2018-01-01 DIAGNOSIS — I951 Orthostatic hypotension: Secondary | ICD-10-CM | POA: Diagnosis present

## 2018-01-01 DIAGNOSIS — J61 Pneumoconiosis due to asbestos and other mineral fibers: Secondary | ICD-10-CM | POA: Diagnosis not present

## 2018-01-01 DIAGNOSIS — J9600 Acute respiratory failure, unspecified whether with hypoxia or hypercapnia: Secondary | ICD-10-CM

## 2018-01-01 DIAGNOSIS — Z87442 Personal history of urinary calculi: Secondary | ICD-10-CM

## 2018-01-01 DIAGNOSIS — I2510 Atherosclerotic heart disease of native coronary artery without angina pectoris: Secondary | ICD-10-CM | POA: Diagnosis present

## 2018-01-01 DIAGNOSIS — Z79899 Other long term (current) drug therapy: Secondary | ICD-10-CM

## 2018-01-01 DIAGNOSIS — F419 Anxiety disorder, unspecified: Secondary | ICD-10-CM | POA: Diagnosis present

## 2018-01-01 DIAGNOSIS — E785 Hyperlipidemia, unspecified: Secondary | ICD-10-CM | POA: Diagnosis present

## 2018-01-01 DIAGNOSIS — J40 Bronchitis, not specified as acute or chronic: Secondary | ICD-10-CM | POA: Diagnosis not present

## 2018-01-01 DIAGNOSIS — Z6823 Body mass index (BMI) 23.0-23.9, adult: Secondary | ICD-10-CM

## 2018-01-01 DIAGNOSIS — R0600 Dyspnea, unspecified: Secondary | ICD-10-CM | POA: Diagnosis present

## 2018-01-01 DIAGNOSIS — G894 Chronic pain syndrome: Secondary | ICD-10-CM | POA: Diagnosis present

## 2018-01-01 DIAGNOSIS — T502X5A Adverse effect of carbonic-anhydrase inhibitors, benzothiadiazides and other diuretics, initial encounter: Secondary | ICD-10-CM | POA: Diagnosis present

## 2018-01-01 DIAGNOSIS — Z7982 Long term (current) use of aspirin: Secondary | ICD-10-CM

## 2018-01-01 DIAGNOSIS — F32A Depression, unspecified: Secondary | ICD-10-CM | POA: Diagnosis present

## 2018-01-01 LAB — CBC WITH DIFFERENTIAL/PLATELET
Basophils Absolute: 0 10*3/uL (ref 0.0–0.1)
Basophils Relative: 0 %
EOS ABS: 0 10*3/uL (ref 0.0–0.7)
Eosinophils Relative: 0 %
HEMATOCRIT: 44.7 % (ref 36.0–46.0)
HEMOGLOBIN: 15 g/dL (ref 12.0–15.0)
LYMPHS ABS: 1.6 10*3/uL (ref 0.7–4.0)
LYMPHS PCT: 31 %
MCH: 29.2 pg (ref 26.0–34.0)
MCHC: 33.6 g/dL (ref 30.0–36.0)
MCV: 87.1 fL (ref 78.0–100.0)
Monocytes Absolute: 0.5 10*3/uL (ref 0.1–1.0)
Monocytes Relative: 10 %
NEUTROS ABS: 2.9 10*3/uL (ref 1.7–7.7)
NEUTROS PCT: 59 %
Platelets: 181 10*3/uL (ref 150–400)
RBC: 5.13 MIL/uL — AB (ref 3.87–5.11)
RDW: 12.6 % (ref 11.5–15.5)
WBC: 5 10*3/uL (ref 4.0–10.5)

## 2018-01-01 LAB — TROPONIN I: Troponin I: <0.03 ng/mL (ref <0.03)

## 2018-01-01 LAB — URINALYSIS, ROUTINE W REFLEX MICROSCOPIC
Bilirubin Urine: NEGATIVE
Glucose, UA: NEGATIVE mg/dL
HGB URINE DIPSTICK: NEGATIVE
Ketones, ur: NEGATIVE mg/dL
Leukocytes, UA: NEGATIVE
NITRITE: NEGATIVE
Protein, ur: NEGATIVE mg/dL
SPECIFIC GRAVITY, URINE: 1.009 (ref 1.005–1.030)
pH: 6 (ref 5.0–8.0)

## 2018-01-01 LAB — COMPREHENSIVE METABOLIC PANEL
ALT: 16 U/L (ref 14–54)
ANION GAP: 12 (ref 5–15)
AST: 29 U/L (ref 15–41)
Albumin: 3.7 g/dL (ref 3.5–5.0)
Alkaline Phosphatase: 58 U/L (ref 38–126)
BUN: 14 mg/dL (ref 6–20)
CHLORIDE: 95 mmol/L — AB (ref 101–111)
CO2: 31 mmol/L (ref 22–32)
CREATININE: 1.2 mg/dL — AB (ref 0.44–1.00)
Calcium: 8.5 mg/dL — ABNORMAL LOW (ref 8.9–10.3)
GFR calc non Af Amer: 44 mL/min — ABNORMAL LOW (ref >60)
GFR, EST AFRICAN AMERICAN: 51 mL/min — AB (ref >60)
Glucose, Bld: 136 mg/dL — ABNORMAL HIGH (ref 65–99)
POTASSIUM: 2.4 mmol/L — AB (ref 3.5–5.1)
SODIUM: 138 mmol/L (ref 135–145)
Total Bilirubin: 1.2 mg/dL (ref 0.3–1.2)
Total Protein: 7 g/dL (ref 6.5–8.1)

## 2018-01-01 LAB — I-STAT CG4 LACTIC ACID, ED
LACTIC ACID, VENOUS: 2.64 mmol/L — AB (ref 0.5–1.9)
LACTIC ACID, VENOUS: 1.25 mmol/L (ref 0.5–1.9)

## 2018-01-01 LAB — INFLUENZA PANEL BY PCR (TYPE A & B)
INFLAPCR: POSITIVE — AB
Influenza B By PCR: NEGATIVE

## 2018-01-01 LAB — MAGNESIUM: Magnesium: 1.7 mg/dL (ref 1.7–2.4)

## 2018-01-01 LAB — CREATININE, SERUM
CREATININE: 1 mg/dL (ref 0.44–1.00)
GFR, EST NON AFRICAN AMERICAN: 55 mL/min — AB (ref >60)

## 2018-01-01 LAB — PREALBUMIN: PREALBUMIN: 15.7 mg/dL — AB (ref 18–38)

## 2018-01-01 MED ORDER — PANTOPRAZOLE SODIUM 40 MG PO TBEC
40.0000 mg | DELAYED_RELEASE_TABLET | Freq: Every day | ORAL | Status: DC
  Administered 2018-01-02 – 2018-01-06 (×5): 40 mg via ORAL
  Filled 2018-01-01 (×5): qty 1

## 2018-01-01 MED ORDER — POTASSIUM CHLORIDE CRYS ER 20 MEQ PO TBCR
40.0000 meq | EXTENDED_RELEASE_TABLET | Freq: Once | ORAL | Status: AC
  Administered 2018-01-01: 40 meq via ORAL
  Filled 2018-01-01: qty 2

## 2018-01-01 MED ORDER — ASPIRIN EC 81 MG PO TBEC
81.0000 mg | DELAYED_RELEASE_TABLET | Freq: Every day | ORAL | Status: DC
  Administered 2018-01-01 – 2018-01-05 (×5): 81 mg via ORAL
  Filled 2018-01-01 (×6): qty 1

## 2018-01-01 MED ORDER — POTASSIUM CHLORIDE 10 MEQ/100ML IV SOLN
10.0000 meq | Freq: Once | INTRAVENOUS | Status: AC
Start: 2018-01-01 — End: 2018-01-01
  Administered 2018-01-01: 10 meq via INTRAVENOUS
  Filled 2018-01-01: qty 100

## 2018-01-01 MED ORDER — AZITHROMYCIN 500 MG PO TABS
500.0000 mg | ORAL_TABLET | Freq: Every day | ORAL | Status: DC
  Administered 2018-01-01 – 2018-01-04 (×4): 500 mg via ORAL
  Filled 2018-01-01 (×2): qty 1
  Filled 2018-01-01: qty 2
  Filled 2018-01-01: qty 1

## 2018-01-01 MED ORDER — POTASSIUM CHLORIDE CRYS ER 20 MEQ PO TBCR
40.0000 meq | EXTENDED_RELEASE_TABLET | Freq: Every day | ORAL | Status: DC
  Administered 2018-01-02: 40 meq via ORAL
  Filled 2018-01-01: qty 2

## 2018-01-01 MED ORDER — AZITHROMYCIN 250 MG PO TABS: 250.0000 mg | ORAL_TABLET | Freq: Every day | ORAL | Status: DC

## 2018-01-01 MED ORDER — ALBUTEROL SULFATE (2.5 MG/3ML) 0.083% IN NEBU
5.0000 mg | INHALATION_SOLUTION | Freq: Once | RESPIRATORY_TRACT | Status: AC
  Administered 2018-01-01: 5 mg via RESPIRATORY_TRACT
  Filled 2018-01-01: qty 6

## 2018-01-01 MED ORDER — NITROGLYCERIN 0.4 MG SL SUBL: 0.4000 mg | SUBLINGUAL_TABLET | SUBLINGUAL | Status: DC | PRN

## 2018-01-01 MED ORDER — SODIUM CHLORIDE 0.9 % IV SOLN
INTRAVENOUS | Status: AC
  Administered 2018-01-01: 19:00:00 via INTRAVENOUS

## 2018-01-01 MED ORDER — TRAZODONE HCL 50 MG PO TABS: 150.0000 mg | ORAL_TABLET | Freq: Every evening | ORAL | Status: DC | PRN

## 2018-01-01 MED ORDER — SALINE SPRAY 0.65 % NA SOLN
1.0000 | NASAL | Status: DC | PRN
  Filled 2018-01-01: qty 44

## 2018-01-01 MED ORDER — DULOXETINE HCL 30 MG PO CPEP: 30.0000 mg | ORAL_CAPSULE | Freq: Every day | ORAL | Status: DC

## 2018-01-01 MED ORDER — ISOSORBIDE MONONITRATE ER 30 MG PO TB24: 30.0000 mg | ORAL_TABLET | ORAL | Status: DC

## 2018-01-01 MED ORDER — ACETAMINOPHEN 650 MG RE SUPP: 650.0000 mg | Freq: Four times a day (QID) | RECTAL | Status: DC | PRN

## 2018-01-01 MED ORDER — DULOXETINE HCL 30 MG PO CPEP
90.0000 mg | ORAL_CAPSULE | Freq: Every day | ORAL | Status: DC
  Administered 2018-01-01 – 2018-01-05 (×5): 90 mg via ORAL
  Filled 2018-01-01 (×6): qty 3

## 2018-01-01 MED ORDER — ENOXAPARIN SODIUM 40 MG/0.4ML ~~LOC~~ SOLN
40.0000 mg | SUBCUTANEOUS | Status: DC
  Administered 2018-01-01 – 2018-01-05 (×5): 40 mg via SUBCUTANEOUS
  Filled 2018-01-01 (×5): qty 0.4

## 2018-01-01 MED ORDER — OXYCODONE HCL 5 MG PO TABS
5.0000 mg | ORAL_TABLET | Freq: Four times a day (QID) | ORAL | Status: DC | PRN
  Administered 2018-01-01 – 2018-01-06 (×16): 5 mg via ORAL
  Filled 2018-01-01 (×16): qty 1

## 2018-01-01 MED ORDER — SODIUM CHLORIDE 0.9 % IV BOLUS (SEPSIS)
500.0000 mL | Freq: Once | INTRAVENOUS | Status: AC
  Administered 2018-01-01: 500 mL via INTRAVENOUS

## 2018-01-01 MED ORDER — OXYCODONE-ACETAMINOPHEN 5-325 MG PO TABS
1.0000 | ORAL_TABLET | Freq: Four times a day (QID) | ORAL | Status: DC | PRN
  Administered 2018-01-01 – 2018-01-06 (×16): 1 via ORAL
  Filled 2018-01-01 (×16): qty 1

## 2018-01-01 MED ORDER — ISOSORBIDE MONONITRATE ER 30 MG PO TB24
30.0000 mg | ORAL_TABLET | Freq: Every evening | ORAL | Status: DC
  Administered 2018-01-01 – 2018-01-05 (×5): 30 mg via ORAL
  Filled 2018-01-01 (×5): qty 1

## 2018-01-01 MED ORDER — IPRATROPIUM BROMIDE 0.02 % IN SOLN
0.5000 mg | Freq: Once | RESPIRATORY_TRACT | Status: AC
  Administered 2018-01-01: 0.5 mg via RESPIRATORY_TRACT
  Filled 2018-01-01: qty 2.5

## 2018-01-01 MED ORDER — ONDANSETRON HCL 4 MG/2ML IJ SOLN
4.0000 mg | Freq: Four times a day (QID) | INTRAMUSCULAR | Status: DC | PRN
  Administered 2018-01-01 – 2018-01-06 (×8): 4 mg via INTRAVENOUS
  Filled 2018-01-01 (×8): qty 2

## 2018-01-01 MED ORDER — ACETAMINOPHEN 325 MG PO TABS
650.0000 mg | ORAL_TABLET | Freq: Four times a day (QID) | ORAL | Status: DC | PRN
  Administered 2018-01-01: 650 mg via ORAL
  Filled 2018-01-01: qty 2

## 2018-01-01 MED ORDER — OXYCODONE-ACETAMINOPHEN 10-325 MG PO TABS: 1.0000 | ORAL_TABLET | Freq: Four times a day (QID) | ORAL | Status: DC | PRN

## 2018-01-01 MED ORDER — IBUPROFEN 400 MG PO TABS
800.0000 mg | ORAL_TABLET | Freq: Three times a day (TID) | ORAL | Status: DC | PRN
  Administered 2018-01-05: 800 mg via ORAL
  Filled 2018-01-01: qty 2

## 2018-01-01 MED ORDER — METHYLPREDNISOLONE SODIUM SUCC 125 MG IJ SOLR
125.0000 mg | Freq: Once | INTRAMUSCULAR | Status: AC
  Administered 2018-01-01: 125 mg via INTRAVENOUS
  Filled 2018-01-01: qty 2

## 2018-01-01 MED ORDER — ONDANSETRON HCL 4 MG PO TABS
4.0000 mg | ORAL_TABLET | Freq: Four times a day (QID) | ORAL | Status: DC | PRN
  Administered 2018-01-02 – 2018-01-05 (×8): 4 mg via ORAL
  Filled 2018-01-01 (×8): qty 1

## 2018-01-01 MED ORDER — ALPRAZOLAM 0.25 MG PO TABS
0.2500 mg | ORAL_TABLET | Freq: Three times a day (TID) | ORAL | Status: DC | PRN
  Administered 2018-01-01 – 2018-01-05 (×7): 0.25 mg via ORAL
  Filled 2018-01-01 (×7): qty 1

## 2018-01-01 MED ORDER — SODIUM CHLORIDE 0.9 % IV BOLUS (SEPSIS)
1000.0000 mL | Freq: Once | INTRAVENOUS | Status: AC
  Administered 2018-01-01: 1000 mL via INTRAVENOUS

## 2018-01-01 MED ORDER — BISACODYL 5 MG PO TBEC: 5.0000 mg | DELAYED_RELEASE_TABLET | Freq: Every day | ORAL | Status: DC | PRN

## 2018-01-01 MED ORDER — POLYETHYLENE GLYCOL 3350 17 G PO PACK: 17.0000 g | PACK | Freq: Every day | ORAL | Status: DC | PRN

## 2018-01-01 MED ORDER — ISOSORBIDE MONONITRATE ER 60 MG PO TB24
60.0000 mg | ORAL_TABLET | Freq: Every day | ORAL | Status: DC
  Administered 2018-01-02 – 2018-01-06 (×5): 60 mg via ORAL
  Filled 2018-01-01 (×5): qty 1

## 2018-01-01 MED ORDER — METHYLPREDNISOLONE SODIUM SUCC 40 MG IJ SOLR
40.0000 mg | Freq: Two times a day (BID) | INTRAMUSCULAR | Status: DC
  Administered 2018-01-02 – 2018-01-05 (×7): 40 mg via INTRAVENOUS
  Filled 2018-01-01 (×7): qty 1

## 2018-01-01 MED ORDER — ALBUTEROL SULFATE (2.5 MG/3ML) 0.083% IN NEBU
2.5000 mg | INHALATION_SOLUTION | Freq: Four times a day (QID) | RESPIRATORY_TRACT | Status: DC
  Administered 2018-01-01 – 2018-01-02 (×2): 2.5 mg via RESPIRATORY_TRACT
  Filled 2018-01-01 (×2): qty 3

## 2018-01-01 MED ORDER — POTASSIUM CHLORIDE CRYS ER 20 MEQ PO TBCR
20.0000 meq | EXTENDED_RELEASE_TABLET | Freq: Every evening | ORAL | Status: DC
  Filled 2018-01-01: qty 1

## 2018-01-01 MED ORDER — GUAIFENESIN ER 600 MG PO TB12
600.0000 mg | ORAL_TABLET | Freq: Two times a day (BID) | ORAL | Status: DC
  Administered 2018-01-01 – 2018-01-06 (×10): 600 mg via ORAL
  Filled 2018-01-01 (×10): qty 1

## 2018-01-01 NOTE — ED Provider Notes (Signed)
[facility] EMERGENCY DEPARTMENT Provider Note   CSN: [id number] Arrival date & time: 01/01/18  1036     History   Chief Complaint Chief Complaint  Patient presents with  . Bronchitis  . Hypotension    HPI Linda [name] is a 72 y.o. female.  HPI  Patient with history of asthma/COPD, CHF presenting after 2 weeks of illness.  She has had cough congestion and generalized malaise over the past 2 weeks.  She has had no appetite and has been drinking liquids but somewhat less than her usual.  At the beginning of the illness approximately 1-1/2 weeks ago she was started on a Z-Pak and given a course of prednisone.  These medications did not help her symptoms very much.  She has finished both of these medications.  She was rechecked at her primary doctor's office and found to be hypotensive with orthostatic changes today.  She was sent to the ED for further evaluation.  She has no further fevers.  She continues to have cough.  She uses 3 L of oxygen at home and that is what she is currently on.  There are no other associated systemic symptoms, there are no other alleviating or modifying factors.    Past Medical History:  Diagnosis Date  . Acute upper GI bleed 06/2014   while pt taking coumadin, plavix, and meloxicam---despite being told not to take coumadin.  . Anginal pain ([place])    Nonobstructive CAD 2014; however, her cardiologist put her on a statin for this and NOT for hyperlipidemia per pt report.  Atyp CP 08/2017 at card f/u, plan for myoc perf imaging.  . Anxiety    panic attacks  . Asthma    w/ asbestososis   . BPPV (benign paroxysmal positional vertigo) 12/16/2012  . Chronic diastolic CHF (congestive heart failure) (HCC)    dry wt as of 11/06/16 is 168 lbs.  . Chronic lower back pain   . COPD (chronic obstructive pulmonary disease) ([place])   . DDD (degenerative disc disease)    lumbar and cervical.   . Diverticular disease   . Fibromyalgia    Patient  states dx was around her late 40s but she had sx's for years prior to this.  . H/O hiatal hernia   . Hay fever   . History of pneumonia    hospitalized 12/2011, 02/2013, and 07/2013 [facility]) for this  . HTN (hypertension)   . Idiopathic angio-edema-urticaria 72014   Angioedema component was very minimal  . Insomnia   . Iron deficiency anemia    Hematologist in [place], [place] did extensive w/u; no cause found; failed oral supplement;; gets fairly regular (q71m or so) IV iron infusions (Venofer -iron sucrose- 200mg  with procrit.  "for 14 yr I've been getting blood work q month & getting infusions prn" (07/12/2013).  Dr. Marin Olp locally, iron infusions done, EPO deficiency dx'd  . Migraine syndrome    "not as often anymore; used to be ~ q wk" (07/12/2013)  . Mixed incontinence urge and stress   . Nephrolithiasis    "passed all on my own or they are still in there" (07/12/2013)  . OSA on CPAP    prior to move to [place]--had another sleep study 10/2015 w/pulm Dr. Camillo Flaming.  . Osteoarthritis    "severe; progressing fast" (07/12/2013); multiple joints-not surgical candidate for TKR (03/2015)  . Parkinsonism [facility]) 2018   Dr. Carles Collet, neuro, saw her 11/24/17 and recommended d/c of abilify as first step.   [name] Kitchen  Pernicious anemia 08/24/2014  . Pleural plaque with presence of asbestos 07/22/2013  . Pulmonary embolism ([place]) 07/2013   Dx at [facility] with very small peripheral upper lobe pe 07/2013: pt took coumadin for about 8-9 mo  . Pyelonephritis    "several times over the last 30 yr" (07/12/2013)  . RBBB (right bundle branch block)   . Recurrent major depression ([place])   . Recurrent UTI    hx of hospitalization for pyelonephritis; started abx prophylaxis 06/2015  . Syncope    Hypotensive; ED visit--Dr. Terrence Dupont did Cath--nonobstructive CAD, EF 55-60%.  In retrospect, suspect pt rx med misuse/polypharmacy  . Tension headache, chronic     Patient Active Problem List   Diagnosis Date Noted  . Altered mental status 10/21/2017  .  Fall 10/21/2017  . Lumbar radiculitis 02/10/2017  . Dehydration 12/04/2016  . Low serum erythropoietin level 10/17/2016  . RBBB 09/23/2016  . Primary osteoarthritis of left knee 06/19/2016  . Debilitated patient 06/06/2016  . Acute on chronic diastolic CHF (congestive heart failure), NYHA class 3 ([place]) 05/30/2016  . SOB (shortness of breath)   . [name] ([place])   . Rotator cuff syndrome of right shoulder 01/10/2016  . UTI (urinary tract infection) 01/01/2016  . Encephalopathy, metabolic 03/50/0938  . CAP (community acquired pneumonia) 01/01/2016  . Fall at home 01/01/2016  . Rhabdomyolysis 01/01/2016  . Diastolic heart failure ([place]) 10/16/2015  . CAD-minor 2014 08/16/2015  . Chest pain with moderate risk for cardiac etiology 08/16/2015  . Narrowing of intervertebral disc space 07/17/2015  . Bilateral lower leg pain 01/24/2015  . 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 Chronic pain syndrome 11/21/2014  . Anxiety and depression 11/21/2014  . Abnormal grief reaction 11/21/2014  . Severe persistent asthma 11/21/2014  . Hypokalemia 11/21/2014  . Hyperlipidemia 11/21/2014  . Osteoarthritis of right knee 10/18/2014  . Pernicious anemia 08/24/2014  . Generalized anxiety disorder--with occasional panic attacks.  08/05/2014  . Recurrent major depression-severe ([place]) 08/05/2014  . Multifactorial gait disorder 07/26/2014  . Chronic respiratory failure ([place]) 07/23/2014  . Epistaxis 07/18/2014  . Acute GI bleeding 07/17/2014  . Anemia associated with acute blood loss 07/17/2014  . Syncope 07/17/2014  . History of pulmonary embolism 07/17/2014  . GI bleed 07/17/2014  . Arthritis 05/11/2014  . DDD (degenerative disc disease) 05/11/2014  . Fibrositis 05/11/2014  . Amianthosis ([place]) 05/11/2014  . Grief 04/28/2014  . OSA (obstructive sleep apnea) 04/24/2014  . Chronic diastolic CHF, NYHA class 1 04/24/2014  . Acute pulmonary embolism ([place]) 08/13/2013  . Pleural plaque with presence of  asbestos 07/22/2013  . Polypharmacy 04/26/2013  . Fibromyalgia syndrome 03/01/2013  . Insomnia 11/12/2012  . HTN (hypertension), benign 10/25/2012    Past Surgical History:  Procedure Laterality Date  . APPENDECTOMY  1960  . AXILLARY SURGERY Left 1978   Multiple "lump" in armpit per pt  . CARDIAC CATHETERIZATION  01/2013   nonobstructive CAD, EF 55-60%  . CARDIOVASCULAR STRESS TEST  02/22/15   Low risk myocard perf imaging; wall motion normal, normal EF  . carotid duplex doppler  10/21/2017   R vertebral flow suggestive of possible distal obstruction.  Pt declined further w/u as of 10/29/17 but need to revisit this problem periodically.  . COCCYX REMOVAL  1972  . DEXA  06/05/2017   T-score -3.1  . DILATION AND CURETTAGE OF UTERUS  ? 1970's  . ESOPHAGOGASTRODUODENOSCOPY N/A 07/19/2014   Gastritis found + in the setting of supratherapeutic INR, +plavix, + meloxicam.  .  EYE SURGERY Left 2012-2013   "injections for ~ 1 yr; don't really know what for" (07/12/2013)  . HEEL SPUR SURGERY Left 2008  . KNEE SURGERY  2005  . LEFT HEART CATHETERIZATION WITH CORONARY ANGIOGRAM N/A 01/30/2013   Procedure: LEFT HEART CATHETERIZATION WITH CORONARY ANGIOGRAM;  Surgeon: Clent Demark, MD;  Location: [facility] C[facility] LAB;  Service: Cardiovascular;  Laterality: N/A;  . PLANTAR FASCIA RELEASE Left 2008  . SPIROMETRY  04/25/14   In hosp for acute asthma/COPD flare: mixed obstructive and restrictive lung disease. The FEV1 is severely reduced at 45% predicted.  FEV1 signif decreased compared to prior spirometry 07/23/13.  . TENDON RELEASE  1996   Right forearm and hand  . TOTAL ABDOMINAL HYSTERECTOMY  1974  . TRANSTHORACIC ECHOCARDIOGRAM  01/2013; 04/2014;08/2015; 09/2017   2014--NORMAL.  2015--focal basal septal hypertrophy, EF 55-60%, grade I diast dysfxn, mild LAE.  08/2015 EF 55-60%, nl LV syst fxn, grade I DD, valves wnl. 10/21/17: EF 65-70%, grd I DD, o/w normal.    OB History    No data available       Home  Medications    Prior to Admission medications   Medication Sig Start Date End Date Taking? Authorizing Provider  aspirin 81 MG tablet Take 81 mg by mouth at bedtime.   Yes [provider]  DULoxetine (CYMBALTA) 30 MG capsule TAKE 1 CAPSULE ONCE DAILY ALONG WITH 60MG  CAPSULE FOR TOTAL 90MG  DAILY. 10/14/17  Yes McGowen, Adrian Blackwater, MD  DULoxetine (CYMBALTA) 60 MG capsule TAKE 1 CAPSULE A DAY TO BE COMBINED WITH 30MG  CAPSULE 07/25/17  Yes McGowen, Adrian Blackwater, MD  GuaiFENesin (MUCINEX PO) Take 1 tablet by mouth as needed (Congestion and Mucus build up).   Yes [provider]  ibuprofen (ADVIL,MOTRIN) 800 MG tablet Take 800 mg by mouth every 8 (eight) hours as needed for moderate pain.   Yes [provider]  isosorbide mononitrate (IMDUR) 30 MG 24 hr tablet Take 1 tablet (30 mg total) by mouth 2 (two) times daily. 60mg  in the am and 30mg  in the pm Patient taking differently: Take 30-60 mg by mouth See admin instructions. 60mg  in the am and 30mg  in the pm 09/16/17  Yes Crenshaw, Denice Bors, MD  lamotrigine (LAMICTAL) 25 MG disintegrating tablet 2 tabs po qd x 15d, then take 4 tabs po qd x 15d 12/25/17  Yes McGowen, Adrian Blackwater, MD  Multiple Vitamin (MULTIVITAMIN WITH MINERALS) TABS tablet Take 1 tablet by mouth daily.   Yes [provider]  oxyCODONE-acetaminophen (PERCOCET) 10-325 MG tablet Take 1 tablet by mouth every 6 (six) hours as needed for pain. 12/11/17  Yes Bayard Hugger, NP  OXYGEN Inhale 3 L into the lungs continuous.   Yes [provider]  pantoprazole (PROTONIX) 40 MG tablet TAKE 1 TABLET DAILY. 02/26/17  Yes McGowen, Adrian Blackwater, MD  potassium chloride SA (K-DUR,KLOR-CON) 20 MEQ tablet Take 20 mEq by mouth as directed. Take 2 tablets by mouth in the morning and tablet in the evening   Yes [provider]  sodium chloride (OCEAN) 0.65 % SOLN nasal spray Place 1-2 sprays into both nostrils as needed for congestion.   Yes [provider]    albuterol (PROVENTIL HFA;VENTOLIN HFA) 108 (90 BASE) MCG/ACT inhaler Inhale 2 puffs into the lungs every 6 (six) hours as needed for wheezing or shortness of breath. 09/15/14   McGowen, Adrian Blackwater, MD  alendronate (FOSAMAX) 70 MG tablet Take 1 tablet (70 mg total) by mouth  every 7 (seven) days. Take with a full glass of water on an empty stomach, first thing in the morning and remain up right for 30 minutes after taking. 06/05/17   McGowen, Adrian Blackwater, MD  ALPRAZolam Duanne Moron) 1 MG tablet TAKE 1 TABLET THREE TIMES DAILY AS NEEDED FOR ANXIETY. 10/15/17   McGowen, Adrian Blackwater, MD  azithromycin (ZITHROMAX) 250 MG tablet 2 tabs po qd x 1d, then 1 tab po qd x 4d Patient not taking: Reported on 01/01/2018 12/25/17   McGowen, Adrian Blackwater, MD  diclofenac sodium (VOLTAREN) 1 % GEL Apply 2 g topically 3 (three) times daily as needed (pain). 11/19/16   Bayard Hugger, NP  furosemide (LASIX) 40 MG tablet Take 1 tablet (40 mg total) by mouth daily. Patient taking differently: Take 40 mg by mouth as needed for fluid.  12/06/16   Dhungel, Flonnie Overman, MD  nitroGLYCERIN (NITROSTAT) 0.4 MG SL tablet Place 1 tablet (0.4 mg total) under the tongue every 5 (five) minutes as needed for chest pain (x 3 doses). 06/02/17   Barrett, Evelene Croon, PA-C  polyethylene glycol (MIRALAX / GLYCOLAX) packet Take 17 g by mouth daily as needed (constipation). Mix in 8 oz liquid and drink    [provider]  predniSONE (DELTASONE) 20 MG tablet 2 tabs po qd x 5d Patient not taking: Reported on 01/01/2018 12/25/17   Tammi Sou, MD  traZODone (DESYREL) 50 MG tablet TAKE 2-4 TABLETS AT BEDTIME Patient taking differently: TAKE 2-4 TABLETS AT BEDTIME FOR SLEEP 05/21/17   McGowen, Adrian Blackwater, MD  Vitamin D, Ergocalciferol, (DRISDOL) 50000 units CAPS capsule 1 tab po TWICE per week 05/29/17   McGowen, Adrian Blackwater, MD    Family History Family History  Problem Relation Age of Onset  . Arthritis Mother   . Kidney disease Mother   . Heart disease Father   .  Stroke Father   . Hypertension Father   . Diabetes Father   . Heart attack Father   . Heart attack Paternal Grandmother   . Diabetes Sister        one sister  . Hypertension Sister   . Hypertension Brother   . Multiple sclerosis Son     Social History Social History   Tobacco Use  . Smoking status: Never Smoker  . Smokeless tobacco: Never Used  . Tobacco comment: never used tobacco  Substance Use Topics  . Alcohol use: No    Alcohol/week: 0.0 oz  . Drug use: No     Allergies   Abilify [aripiprazole] and Penicillins   Review of Systems Review of Systems  ROS reviewed and all otherwise negative except for mentioned in HPI   Physical Exam Updated Vital Signs BP (!) 123/50   Pulse 84   Temp 97.7 F (36.5 C) (Oral)   Resp 13   Ht 5\' 3"  (1.6 m)   Wt 60.8 kg (134 lb)   SpO2 100%   BMI 23.74 kg/m  Vitals reviewed Physical Exam  Physical Examination: General appearance - alert, fatigued  appearing, and in no distress Mental status - alert, oriented to person, place, and time Eyes - no scleral icterus, no conjunctival injection, dark circles under eyes Mouth -mucous membranes dry Neck - supple, no significant adenopathy Chest - decreased breath sounds, miild wheezing, no dyspnea Heart - normal rate, regular rhythm, normal S1, S2, no murmurs, rubs, clicks or gallops Abdomen - soft, nontender, nondistended, no masses or organomegaly Neurological - alert, oriented, normal speech Extremities - peripheral  pulses normal, no pedal edema, no clubbing or cyanosis Skin - normal coloration and turgor, no rashes   ED Treatments / Results  Labs (all labs ordered are listed, but only abnormal results are displayed) Labs Reviewed  COMPREHENSIVE METABOLIC PANEL - Abnormal; Notable for the following components:      Result Value   Potassium 2.4 (*)    Chloride 95 (*)    Glucose, Bld 136 (*)    Creatinine, Ser 1.20 (*)    Calcium 8.5 (*)    GFR calc non Af Amer 44 (*)     GFR calc Af Amer 51 (*)    All other components within normal limits  CBC WITH DIFFERENTIAL/PLATELET - Abnormal; Notable for the following components:   RBC 5.13 (*)    All other components within normal limits  URINALYSIS, ROUTINE W REFLEX MICROSCOPIC - Abnormal; Notable for the following components:   APPearance HAZY (*)    All other components within normal limits  I-STAT CG4 LACTIC ACID, ED - Abnormal; Notable for the following components:   Lactic Acid, Venous 2.64 (*)    All other components within normal limits  RESPIRATORY PANEL BY PCR  INFLUENZA PANEL BY PCR (TYPE A & B)  I-STAT CG4 LACTIC ACID, ED    EKG  EKG Interpretation  Date/Time:  Thursday January 01 2018 11:53:29 EST Ventricular Rate:  85 PR Interval:    QRS Duration: 147 QT Interval:  452 QTC Calculation: 538 R Axis:   22 Text Interpretation:  Sinus rhythm Probable left atrial enlargement Right bundle branch block No significant change since last tracing Confirmed by Alfonzo Beers [phone]) on 01/01/2018 11:59:59 AM       Radiology Dg Chest Port 1 View  Result Date: 01/01/2018 CLINICAL DATA:  Bronchitis EXAM: PORTABLE CHEST 1 VIEW COMPARISON:  10/21/2017 FINDINGS: Calcified pleural plaques bilaterally. Heart is normal size. No confluent airspace opacities or effusions. No acute bony abnormality. IMPRESSION: Bilateral calcified pleural plaques. No acute cardiopulmonary disease. Electronically Signed   By: Rolm Baptise M.D.   On: 01/01/2018 12:06    Procedures Procedures (including critical care time)  Medications Ordered in ED Medications  sodium chloride 0.9 % bolus 1,000 mL (0 mLs Intravenous Stopped 01/01/18 1300)  albuterol (PROVENTIL) (2.5 MG/3ML) 0.083% nebulizer solution 5 mg (5 mg Nebulization Given 01/01/18 1219)  ipratropium (ATROVENT) nebulizer solution 0.5 mg (0.5 mg Nebulization Given 01/01/18 1219)  potassium chloride 10 mEq in 100 mL IVPB (0 mEq Intravenous Stopped 01/01/18 1439)  potassium  chloride SA (K-DUR,KLOR-CON) CR tablet 40 mEq (40 mEq Oral Given 01/01/18 1343)  sodium chloride 0.9 % bolus 500 mL (0 mLs Intravenous Stopped 01/01/18 1500)  albuterol (PROVENTIL) (2.5 MG/3ML) 0.083% nebulizer solution 5 mg (5 mg Nebulization Given 01/01/18 1606)  ipratropium (ATROVENT) nebulizer solution 0.5 mg (0.5 mg Nebulization Given 01/01/18 1606)  methylPREDNISolone sodium succinate (SOLU-MEDROL) 125 mg/2 mL injection 125 mg (125 mg Intravenous Given 01/01/18 1606)     Initial Impression / Assessment and Plan / ED Course  I have reviewed the triage vital signs and the nursing notes.  Pertinent labs & imaging results that were available during my care of the patient were reviewed by me and considered in my medical decision making (see chart for details).    3:57 PM  D/w [facility] for admission.  They will see patient in the ED.    Patient presenting with complaint of weakness and ongoing bronchitis.  Also appears very dehydrated.  Patient treated with nebulizers, Solu-Medrol  and IV fluids.  On recheck she still appears very fatigued and dehydrated.  Discussed with hospitalist for admission and patient is agreeable with this plan.    Final Clinical Impressions(s) / ED Diagnoses   Final diagnoses:  Bronchitis  Dehydration  Other specified hypotension    ED Discharge Orders    None       Rand Etchison, Forbes Cellar, MD 01/01/18 1712

## 2018-01-01 NOTE — Telephone Encounter (Signed)
Noted agree

## 2018-01-01 NOTE — ED Notes (Signed)
Care handoff to [facility]

## 2018-01-01 NOTE — ED Notes (Signed)
Pt (and family) notified that we need a UA from her.

## 2018-01-01 NOTE — H&P (Signed)
History and Physical    TASNIA SPEGAL UDJ:[id number] DOB: 08-24-1946 DOA: 01/01/2018  PCP: Tammi Sou, MD Patient coming from: home  Chief Complaint: weakness/hypotension HPI: Linda [name] is a 72 y.o. female with medical history significant for COPD/chronic bronchitis, diastolic heart failure, chronic pain syndrome, CAD, anemia, presents to the emergency department with chief complaint generalized weakness hypotension persistent worsening bronchitis. Initial evaluation reveals hypokalemia, dehydration.  Information is obtained from the patient and the chart. Patient reports over the last 2 weeks she's had persistent cough with chest congestion some head congestion and worsening shortness of breath. She does not wear oxygen at home and that what has been prescribed as she states "I'm trying to wean myself off of that". She saw primary care provider was given a Z-Pak and a course of prednisone which she completed. She states she does not think she improved at all. He was rechecked by primary care provider today and found to be hypotensive with orthostatic changes a systolic blood pressure in the 60s. Patient does endorse some decreased oral intake over the last week. She reports unintentional weight loss of 45lb over last 12 months. Also reports she believes she had fever 2 days ago but doesn't have a murmur. She denies lower extremity edema chest pain palpitations headache dizziness syncope or near-syncope. She denies dysuria hematuria frequency or urgency but states that she believes her urine has an odor  ED Course: In the emergency department she's afebrile hypotensive. She is provided with IV fluids and at the time of admission systolic blood pressure 588. She is not hypoxic.  Review of Systems: As per HPI otherwise all other systems reviewed and are negative.   Ambulatory Status: Ambulates independently with an unsteady gait. She did have a fall last week dependent with  ADLs  Past Medical History:  Diagnosis Date  . Acute upper GI bleed 06/2014   while pt taking coumadin, plavix, and meloxicam---despite being told not to take coumadin.  . Anginal pain ([place])    Nonobstructive CAD 2014; however, her cardiologist put her on a statin for this and NOT for hyperlipidemia per pt report.  Atyp CP 08/2017 at card f/u, plan for myoc perf imaging.  . Anxiety    panic attacks  . Asthma    w/ asbestososis   . BPPV (benign paroxysmal positional vertigo) 12/16/2012  . Chronic diastolic CHF (congestive heart failure) (HCC)    dry wt as of 11/06/16 is 168 lbs.  . Chronic lower back pain   . COPD (chronic obstructive pulmonary disease) ([place])   . DDD (degenerative disc disease)    lumbar and cervical.   . Diverticular disease   . Fibromyalgia    Patient states dx was around her late 28s but she had sx's for years prior to this.  . H/O hiatal hernia   . Hay fever   . History of pneumonia    hospitalized 12/2011, 02/2013, and 07/2013 [facility]) for this  . HTN (hypertension)   . Idiopathic angio-edema-urticaria 72014   Angioedema component was very minimal  . Insomnia   . Iron deficiency anemia    Hematologist in [place], [place] did extensive w/u; no cause found; failed oral supplement;; gets fairly regular (q8m or so) IV iron infusions (Venofer -iron sucrose- 200mg  with procrit.  "for 14 yr I've been getting blood work q month & getting infusions prn" (07/12/2013).  Dr. Marin Olp locally, iron infusions done, EPO deficiency dx'd  . Migraine syndrome    "not as  often anymore; used to be ~ q wk" (07/12/2013)  . Mixed incontinence urge and stress   . Nephrolithiasis    "passed all on my own or they are still in there" (07/12/2013)  . OSA on CPAP    prior to move to [place]--had another sleep study 10/2015 w/pulm Dr. Camillo Flaming.  . Osteoarthritis    "severe; progressing fast" (07/12/2013); multiple joints-not surgical candidate for TKR (03/2015)  . Parkinsonism [facility]) 2018   Dr. Carles Collet, neuro,  saw her 11/24/17 and recommended d/c of abilify as first step.   [name] Kitchen Pernicious anemia 08/24/2014  . Pleural plaque with presence of asbestos 07/22/2013  . Pulmonary embolism ([place]) 07/2013   Dx at [facility] with very small peripheral upper lobe pe 07/2013: pt took coumadin for about 8-9 mo  . Pyelonephritis    "several times over the last 30 yr" (07/12/2013)  . RBBB (right bundle branch block)   . Recurrent major depression ([place])   . Recurrent UTI    hx of hospitalization for pyelonephritis; started abx prophylaxis 06/2015  . Syncope    Hypotensive; ED visit--Dr. Terrence Dupont did Cath--nonobstructive CAD, EF 55-60%.  In retrospect, suspect pt rx med misuse/polypharmacy  . Tension headache, chronic     Past Surgical History:  Procedure Laterality Date  . APPENDECTOMY  1960  . AXILLARY SURGERY Left 1978   Multiple "lump" in armpit per pt  . CARDIAC CATHETERIZATION  01/2013   nonobstructive CAD, EF 55-60%  . CARDIOVASCULAR STRESS TEST  02/22/15   Low risk myocard perf imaging; wall motion normal, normal EF  . carotid duplex doppler  10/21/2017   R vertebral flow suggestive of possible distal obstruction.  Pt declined further w/u as of 10/29/17 but need to revisit this problem periodically.  . COCCYX REMOVAL  1972  . DEXA  06/05/2017   T-score -3.1  . DILATION AND CURETTAGE OF UTERUS  ? 1970's  . ESOPHAGOGASTRODUODENOSCOPY N/A 07/19/2014   Gastritis found + in the setting of supratherapeutic INR, +plavix, + meloxicam.  . EYE SURGERY Left 2012-2013   "injections for ~ 1 yr; don't really know what for" (07/12/2013)  . HEEL SPUR SURGERY Left 2008  . KNEE SURGERY  2005  . LEFT HEART CATHETERIZATION WITH CORONARY ANGIOGRAM N/A 01/30/2013   Procedure: LEFT HEART CATHETERIZATION WITH CORONARY ANGIOGRAM;  Surgeon: Clent Demark, MD;  Location[facility] CATH LAB;  Service: Cardiovascular;  Laterality: N/A;  . PLANTAR FASCIA RELEASE Left 2008  . SPIROMETRY  04/25/14   In hosp for acute asthma/COPD flare: mixed obstructive  and restrictive lung disease. The FEV1 is severely reduced at 45% predicted.  FEV1 signif decreased compared to prior spirometry 07/23/13.  . TENDON RELEASE  1996   Right forearm and hand  . TOTAL ABDOMINAL HYSTERECTOMY  1974  . TRANSTHORACIC ECHOCARDIOGRAM  01/2013; 04/2014;08/2015; 09/2017   2014--NORMAL.  2015--focal basal septal hypertrophy, EF 55-60%, grade I diast dysfxn, mild LAE.  08/2015 EF 55-60%, nl LV syst fxn, grade I DD, valves wnl. 10/21/17: EF 65-70%, grd I DD, o/w normal.    Social History   Socioeconomic History  . Marital status: Widowed    Spouse name: Not on file  . Number of children: 2  . Years of education: Not on file  . Highest education level: Not on file  Social Needs  . Financial resource strain: Not on file  . Food insecurity - worry: Not on file  . Food insecurity - inability: Not on file  . Transportation needs - medical:  Not on file  . Transportation needs - non-medical: Not on file  Occupational History  . 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 Smoking status: Never Smoker  . Smokeless tobacco: Never Used  . Tobacco comment: never used tobacco  Substance and Sexual Activity  . Alcohol use: No    Alcohol/week: 0.0 oz  . Drug use: No  . Sexual activity: Not Currently  Other Topics Concern  . Not on file  Social History Narrative   Widowed, 2 sons.  Relocated to [place] 09/2012 to be closer to her son who has MS.   Husband d 2015--mesothelioma.   Occupation: former Pharmacist, hospital.   Education: masters degree level.   No T/A/Ds.   Lives in [place], independent living.    Allergies  Allergen Reactions  . Abilify [Aripiprazole] Other (See Comments)    parkinsonism  . Penicillins Itching, Swelling and Rash    Tolerated Cefepime in ED. Has patient had a PCN reaction causing immediate rash, facial/tongue/throat swelling, SOB or lightheadedness with hypotension: Yes Has patient had a PCN reaction causing severe rash  involving mucus membranes or skin necrosis: No Has patient had a PCN reaction that required hospitalization: No  Has patient had a PCN reaction occurring within the last 10 years: No     Family History  Problem Relation Age of Onset  . Arthritis Mother   . Kidney disease Mother   . Heart disease Father   . Stroke Father   . Hypertension Father   . Diabetes Father   . Heart attack Father   . Heart attack Paternal Grandmother   . Diabetes Sister        one sister  . Hypertension Sister   . Hypertension Brother   . Multiple sclerosis Son     Prior to Admission medications   Medication Sig Start Date End Date Taking? Authorizing Provider  aspirin 81 MG tablet Take 81 mg by mouth at bedtime.   Yes [provider]  DULoxetine (CYMBALTA) 30 MG capsule TAKE 1 CAPSULE ONCE DAILY ALONG WITH 60MG  CAPSULE FOR TOTAL 90MG  DAILY. 10/14/17  Yes McGowen, Adrian Blackwater, MD  DULoxetine (CYMBALTA) 60 MG capsule TAKE 1 CAPSULE A DAY TO BE COMBINED WITH 30MG  CAPSULE 07/25/17  Yes McGowen, Adrian Blackwater, MD  GuaiFENesin (MUCINEX PO) Take 1 tablet by mouth as needed (Congestion and Mucus build up).   Yes [provider]  ibuprofen (ADVIL,MOTRIN) 800 MG tablet Take 800 mg by mouth every 8 (eight) hours as needed for moderate pain.   Yes [provider]  isosorbide mononitrate (IMDUR) 30 MG 24 hr tablet Take 1 tablet (30 mg total) by mouth 2 (two) times daily. 60mg  in the am and 30mg  in the pm Patient taking differently: Take 30-60 mg by mouth See admin instructions. 60mg  in the am and 30mg  in the pm 09/16/17  Yes Lelon Perla, MD  lamotrigine (LAMICTAL) 25 MG disintegrating tablet 2 tabs po qd x 15d, then take 4 tabs po qd x 15d 12/25/17  Yes McGowen, Adrian Blackwater, MD  Multiple Vitamin (MULTIVITAMIN WITH MINERALS) TABS tablet Take 1 tablet by mouth daily.   Yes [provider]  oxyCODONE-acetaminophen (PERCOCET) 10-325 MG tablet Take 1 tablet by mouth every 6 (six) hours as needed for  pain. 12/11/17  Yes Bayard Hugger, NP  OXYGEN Inhale 3 L into the lungs continuous.   Yes [provider]  pantoprazole (PROTONIX) 40 MG tablet TAKE  1 TABLET DAILY. 02/26/17  Yes McGowen, Adrian Blackwater, MD  potassium chloride SA (K-DUR,KLOR-CON) 20 MEQ tablet Take 20 mEq by mouth as directed. Take 2 tablets by mouth in the morning and tablet in the evening   Yes [provider]  sodium chloride (OCEAN) 0.65 % SOLN nasal spray Place 1-2 sprays into both nostrils as needed for congestion.   Yes [provider]  albuterol (PROVENTIL HFA;VENTOLIN HFA) 108 (90 BASE) MCG/ACT inhaler Inhale 2 puffs into the lungs every 6 (six) hours as needed for wheezing or shortness of breath. 09/15/14   McGowen, Adrian Blackwater, MD  alendronate (FOSAMAX) 70 MG tablet Take 1 tablet (70 mg total) by mouth every 7 (seven) days. Take with a full glass of water on an empty stomach, first thing in the morning and remain up right for 30 minutes after taking. 06/05/17   McGowen, Adrian Blackwater, MD  ALPRAZolam Duanne Moron) 1 MG tablet TAKE 1 TABLET THREE TIMES DAILY AS NEEDED FOR ANXIETY. 10/15/17   McGowen, Adrian Blackwater, MD  azithromycin (ZITHROMAX) 250 MG tablet 2 tabs po qd x 1d, then 1 tab po qd x 4d Patient not taking: Reported on 01/01/2018 12/25/17   McGowen, Adrian Blackwater, MD  diclofenac sodium (VOLTAREN) 1 % GEL Apply 2 g topically 3 (three) times daily as needed (pain). 11/19/16   Bayard Hugger, NP  furosemide (LASIX) 40 MG tablet Take 1 tablet (40 mg total) by mouth daily. Patient taking differently: Take 40 mg by mouth as needed for fluid.  12/06/16   Dhungel, Flonnie Overman, MD  nitroGLYCERIN (NITROSTAT) 0.4 MG SL tablet Place 1 tablet (0.4 mg total) under the tongue every 5 (five) minutes as needed for chest pain (x 3 doses). 06/02/17   Barrett, Evelene Croon, PA-C  polyethylene glycol (MIRALAX / GLYCOLAX) packet Take 17 g by mouth daily as needed (constipation). Mix in 8 oz liquid and drink    [provider]  predniSONE  (DELTASONE) 20 MG tablet 2 tabs po qd x 5d Patient not taking: Reported on 01/01/2018 12/25/17   Tammi Sou, MD  traZODone (DESYREL) 50 MG tablet TAKE 2-4 TABLETS AT BEDTIME Patient taking differently: TAKE 2-4 TABLETS AT BEDTIME FOR SLEEP 05/21/17   McGowen, Adrian Blackwater, MD  Vitamin D, Ergocalciferol, (DRISDOL) 50000 units CAPS capsule 1 tab po TWICE per week 05/29/17   Tammi Sou, MD    Physical Exam: Vitals:   01/01/18 1515 01/01/18 1545 01/01/18 1615 01/01/18 1645  BP: (!) 96/47 (!) 123/50 124/65 (!) 118/50  Pulse: 79 84 81 92  Resp: 17 13 (!) 21 12  Temp:      TempSrc:      SpO2: 97% 100% 100% 100%  Weight:      Height:         General:  Appears calm and comfortable in no acute distress Eyes:  PERRL, EOMI, normal lids, iris ENT:  grossly normal hearing, lips & tongue, mucous membranes of her mouth are pink but dry Neck:  no LAD, masses or thyromegaly Cardiovascular:  Regular rate and rhythm no murmur gallop or rub no lower extremity edema Respiratory:  Mild increased work of breathing with conversation. She is able to complete sentences. Sounds are fair but very coarse throughout yes frequent wet sounding but nonproductive cough during exam faint wheeze no crackles Abdomen:  soft, ntnd, positive bowel sounds throughout no guarding or rebounding Skin:  no rash or induration seen on limited exam Musculoskeletal:  grossly normal tone BUE/BLE, good  ROM, no bony abnormality Psychiatric:  grossly normal mood and affect, speech fluent and appropriate, AOx3 Neurologic:  CN 2-12 grossly intact, moves all extremities in coordinated fashion, sensation intact alert and oriented 3  Labs on Admission: I have personally reviewed following labs and imaging studies  CBC: Recent Labs  Lab 01/01/18 1052  WBC 5.0  NEUTROABS 2.9  HGB 15.0  HCT 44.7  MCV 87.1  PLT 660   Basic Metabolic Panel: Recent Labs  Lab 01/01/18 1052  NA 138  K 2.4*  CL 95*  CO2 31  GLUCOSE 136*  BUN  14  CREATININE 1.20*  CALCIUM 8.5*   GFR: Estimated Creatinine Clearance: 35.6 mL/min (A) (by C-G formula based on SCr of 1.2 mg/dL (H)). Liver Function Tests: Recent Labs  Lab 01/01/18 1052  AST 29  ALT 16  ALKPHOS 58  BILITOT 1.2  PROT 7.0  ALBUMIN 3.7   No results for input(s): LIPASE, AMYLASE in the last 168 hours. No results for input(s): AMMONIA in the last 168 hours. Coagulation Profile: No results for input(s): INR, PROTIME in the last 168 hours. Cardiac Enzymes: No results for input(s): CKTOTAL, CKMB, CKMBINDEX, TROPONINI in the last 168 hours. BNP (last 3 results) No results for input(s): PROBNP in the last 8760 hours. HbA1C: No results for input(s): HGBA1C in the last 72 hours. CBG: No results for input(s): GLUCAP in the last 168 hours. Lipid Profile: No results for input(s): CHOL, HDL, LDLCALC, TRIG, CHOLHDL, LDLDIRECT in the last 72 hours. Thyroid Function Tests: No results for input(s): TSH, T4TOTAL, FREET4, T3FREE, THYROIDAB in the last 72 hours. Anemia Panel: No results for input(s): VITAMINB12, FOLATE, FERRITIN, TIBC, IRON, RETICCTPCT in the last 72 hours. Urine analysis:    Component Value Date/Time   COLORURINE YELLOW 01/01/2018 1056   APPEARANCEUR HAZY (A) 01/01/2018 1056   LABSPEC 1.009 01/01/2018 1056   PHURINE 6.0 01/01/2018 1056   GLUCOSEU NEGATIVE 01/01/2018 1056   HGBUR NEGATIVE 01/01/2018 1056   BILIRUBINUR NEGATIVE 01/01/2018 1056   BILIRUBINUR small 10/01/2016 1253   KETONESUR NEGATIVE 01/01/2018 1056   PROTEINUR NEGATIVE 01/01/2018 1056   UROBILINOGEN 1.0 10/01/2016 1253   UROBILINOGEN 1.0 10/16/2015 2009   NITRITE NEGATIVE 01/01/2018 1056   LEUKOCYTESUR NEGATIVE 01/01/2018 1056    Creatinine Clearance: Estimated Creatinine Clearance: 35.6 mL/min (A) (by C-G formula based on SCr of 1.2 mg/dL (H)).  Sepsis Labs: @LABRCNTIP (procalcitonin:4,lacticidven:4) )No results found for this or any previous visit (from the past 240 hour(s)).    Radiological Exams on Admission: Dg Chest Port 1 View  Result Date: 01/01/2018 CLINICAL DATA:  Bronchitis EXAM: PORTABLE CHEST 1 VIEW COMPARISON:  10/21/2017 FINDINGS: Calcified pleural plaques bilaterally. Heart is normal size. No confluent airspace opacities or effusions. No acute bony abnormality. IMPRESSION: Bilateral calcified pleural plaques. No acute cardiopulmonary disease. Electronically Signed   By: Rolm Baptise M.D.   On: 01/01/2018 12:06    EKG: Independently reviewed. Sinus rhythm Probable left atrial enlargement Right bundle branch block No significant change since last tracing  Assessment/Plan Principal Problem:   Hypokalemia Active Problems:   HTN (hypertension), benign   Chronic diastolic CHF, NYHA class 1   Chronic respiratory failure (HCC)   Chronic pain syndrome   Anxiety and depression   Hyperlipidemia   Dehydration   Dyspnea   Failure to thrive in adult   #1. Hypokalemia. Likely related to decreased oral intake in the setting of diuretics. Potassium level II.4. She is provided with 10 mEq of potassium IV in the  emergency department as well as 20 mEq orally.  EKG as noted above -Admit to telemetry -Check magnesium level -Replete -Recheck in the morning  #2. Dehydration/hypotension. Reportedly systolic blood pressure 80 in the outpatient setting. Likely related to decreased oral intake in the setting of Lasix. -Gentle IV fluids -Hold Lasix for now -Check orthostatic vital signs in the morning -continue imdur -monitor  #3. Chronic respiratory failure related to COPD/chronic bronchitis. Patient reports she has been prescribed oxygen but has "been weaning herself off. Chart review indicates 2 days ago she called her primary care provider complaining of worsening cough and sinus congestion requesting a decongestant. Chest x-ray as noted above. She has a mild leukocytosis but has been on steroids recently she's afebrile nontoxic appearing. Initially lactic acid  2.64. Provided with IV fluids and repeat 1.25. Oxygen saturation level greater than 90% on 4 L. -Continue with scheduled nebs -Continue steroids -Continue oxygen supplementation -Monitor oxygen saturation level -Mucinex -If no improvement consider repeat chest x-ray  #4. Chronic diastolic heart failure. Appears on the dry side. Home medications include Lasix, imdur. Echo done 2 months ago reveals an EF of 60% and grade 1 diastolic dysfunction. -Obtain daily weights -Intake and output -Hold Lasix for now -continue imdur -monitor  #5. Hypertension. Blood pressure on the low end of normal at the time of admission. Patient was found to be hypotensive earlier today. -Hold Lasix for now -continue imdur -monitor -obtain orthostatic vital signs  #6. Chronic pain syndrome. Appears stable at baseline -Continue home meds  #7. Failure to thrive. Patient reports an unintentional weight loss of 45 pounds over the last well months. -nutritional consult -PT consult   DVT prophylaxis: lovenox  Code Status: full  Family Communication: none present  Disposition Plan: back to facility  Consults called: none  Admission status: inpatient    Radene Gunning MD [facility]  If 7PM-7AM, please contact night-coverage [email or web address] Password [email or web address]  01/01/2018, 5:12 PM

## 2018-01-01 NOTE — ED Notes (Signed)
UA was attempted to be collected by bedpan but Pt sat up prior to assistance from tech or RN. Not enough urine was produced for both culture and UA. Only UA was collected and sent to lab.

## 2018-01-01 NOTE — ED Notes (Addendum)
Pt's son had to go to work and requested that we call him with results and/or decisions about whether she is being admitted/discharged.  He is her current emergency contact on file Gavriella Hearst: [phone]).

## 2018-01-01 NOTE — ED Triage Notes (Signed)
Per Pt, Pt was sent over from doctor with reports of bronchitis, sinusitis, and hypotension. Pt wears chronic oxygen of 3L at home. Pt has had symptoms for two weeks. Reports fevers in the beginning.

## 2018-01-01 NOTE — ED Notes (Addendum)
Lab called this RN with critical K+ lab result, message relayed to pt's nurse, Etta Quill

## 2018-01-01 NOTE — Telephone Encounter (Signed)
Phone call received from [place], [facility], regarding patient.  Patient is having c/o feeling very weak and not eating very much, she was seen 12/25/17 for bronchitis. BP sitting 86/48 and standing 68/38. [name] notified and instructed patient to go to ED. Patient was instructed per Margarita Grizzle with [place].

## 2018-01-01 NOTE — ED Notes (Signed)
Meal ordered for PT 

## 2018-01-01 NOTE — ED Notes (Signed)
Pt on bedpan. RN at bedside.

## 2018-01-02 DIAGNOSIS — J9611 Chronic respiratory failure with hypoxia: Secondary | ICD-10-CM

## 2018-01-02 DIAGNOSIS — N179 Acute kidney failure, unspecified: Secondary | ICD-10-CM

## 2018-01-02 DIAGNOSIS — J101 Influenza due to other identified influenza virus with other respiratory manifestations: Secondary | ICD-10-CM

## 2018-01-02 DIAGNOSIS — E876 Hypokalemia: Secondary | ICD-10-CM

## 2018-01-02 DIAGNOSIS — J61 Pneumoconiosis due to asbestos and other mineral fibers: Secondary | ICD-10-CM

## 2018-01-02 DIAGNOSIS — R627 Adult failure to thrive: Secondary | ICD-10-CM

## 2018-01-02 DIAGNOSIS — E860 Dehydration: Secondary | ICD-10-CM

## 2018-01-02 DIAGNOSIS — J209 Acute bronchitis, unspecified: Secondary | ICD-10-CM

## 2018-01-02 LAB — RESPIRATORY PANEL BY PCR
Adenovirus: NOT DETECTED
Bordetella pertussis: NOT DETECTED
CORONAVIRUS NL63-RVPPCR: NOT DETECTED
CORONAVIRUS OC43-RVPPCR: NOT DETECTED
Chlamydophila pneumoniae: NOT DETECTED
Coronavirus 229E: NOT DETECTED
Coronavirus HKU1: NOT DETECTED
INFLUENZA A H1 2009-RVPPR: DETECTED — AB
INFLUENZA B-RVPPCR: NOT DETECTED
METAPNEUMOVIRUS-RVPPCR: NOT DETECTED
Mycoplasma pneumoniae: NOT DETECTED
PARAINFLUENZA VIRUS 2-RVPPCR: NOT DETECTED
PARAINFLUENZA VIRUS 3-RVPPCR: NOT DETECTED
PARAINFLUENZA VIRUS 4-RVPPCR: NOT DETECTED
Parainfluenza Virus 1: NOT DETECTED
RESPIRATORY SYNCYTIAL VIRUS-RVPPCR: NOT DETECTED
Rhinovirus / Enterovirus: NOT DETECTED

## 2018-01-02 LAB — BASIC METABOLIC PANEL
ANION GAP: 11 (ref 5–15)
BUN: 9 mg/dL (ref 6–20)
CHLORIDE: 106 mmol/L (ref 101–111)
CO2: 23 mmol/L (ref 22–32)
Calcium: 8.5 mg/dL — ABNORMAL LOW (ref 8.9–10.3)
Creatinine, Ser: 0.88 mg/dL (ref 0.44–1.00)
GFR calc Af Amer: >60 mL/min (ref >60)
GLUCOSE: 217 mg/dL — AB (ref 65–99)
POTASSIUM: 2.5 mmol/L — AB (ref 3.5–5.1)
Sodium: 140 mmol/L (ref 135–145)

## 2018-01-02 LAB — TROPONIN I: Troponin I: <0.03 ng/mL (ref <0.03)

## 2018-01-02 LAB — MAGNESIUM: Magnesium: 1.5 mg/dL — ABNORMAL LOW (ref 1.7–2.4)

## 2018-01-02 LAB — MRSA PCR SCREENING: MRSA BY PCR: NEGATIVE

## 2018-01-02 MED ORDER — LEVALBUTEROL HCL 0.63 MG/3ML IN NEBU: 0.6300 mg | INHALATION_SOLUTION | Freq: Four times a day (QID) | RESPIRATORY_TRACT | Status: DC | PRN

## 2018-01-02 MED ORDER — POTASSIUM CHLORIDE CRYS ER 20 MEQ PO TBCR: 40.0000 meq | EXTENDED_RELEASE_TABLET | ORAL | Status: DC

## 2018-01-02 MED ORDER — MAGNESIUM SULFATE 2 GM/50ML IV SOLN
2.0000 g | Freq: Once | INTRAVENOUS | Status: AC
  Administered 2018-01-02: 2 g via INTRAVENOUS
  Filled 2018-01-02: qty 50

## 2018-01-02 MED ORDER — OSELTAMIVIR PHOSPHATE 30 MG PO CAPS
30.0000 mg | ORAL_CAPSULE | Freq: Two times a day (BID) | ORAL | Status: DC
  Administered 2018-01-02 (×2): 30 mg via ORAL
  Filled 2018-01-02 (×3): qty 1

## 2018-01-02 MED ORDER — ALBUTEROL SULFATE (2.5 MG/3ML) 0.083% IN NEBU
2.5000 mg | INHALATION_SOLUTION | Freq: Four times a day (QID) | RESPIRATORY_TRACT | Status: DC
  Administered 2018-01-02: 2.5 mg via RESPIRATORY_TRACT
  Filled 2018-01-02: qty 3

## 2018-01-02 MED ORDER — POTASSIUM CHLORIDE CRYS ER 20 MEQ PO TBCR
40.0000 meq | EXTENDED_RELEASE_TABLET | ORAL | Status: AC
  Administered 2018-01-02 (×2): 40 meq via ORAL
  Filled 2018-01-02 (×2): qty 2

## 2018-01-02 MED ORDER — TRAZODONE HCL 50 MG PO TABS
150.0000 mg | ORAL_TABLET | Freq: Every day | ORAL | Status: DC
  Administered 2018-01-02 – 2018-01-05 (×4): 150 mg via ORAL
  Filled 2018-01-02 (×4): qty 1

## 2018-01-02 MED ORDER — ENSURE ENLIVE PO LIQD
237.0000 mL | Freq: Two times a day (BID) | ORAL | Status: DC
  Administered 2018-01-03 – 2018-01-05 (×2): 237 mL via ORAL

## 2018-01-02 NOTE — Progress Notes (Signed)
Initial Nutrition Assessment  DOCUMENTATION CODES:   Not applicable  INTERVENTION:   Ensure Enlive (pt prefers strawberry) po BID, each supplement provides 350 kcal and 20 grams of protein  NUTRITION DIAGNOSIS:   Inadequate oral intake related to poor appetite as evidenced by per patient/family report  GOAL:   Patient will meet greater than or equal to 90% of their needs  MONITOR:   PO intake, Supplement acceptance, Weight trends, I & O's  REASON FOR ASSESSMENT:   Malnutrition Screening Tool    ASSESSMENT:   Pt with PMH of COPD, HTN, OSA on CPAP, DDD, and anemia presents with hypokalemia and dehydration. Pt found to be influenza A positive.    Per chart pt from [facility] that offers 3 meals in the dining room daily.  Pt reports she is nauseous at visit however reports no other nutrition impact symptoms at this time.   Per chart, pt consuming between 25-85% of meals at this time. Pt consumes 2 meals per day, cinnamon raisin toast with orange marmalade and coffee and then a "typical" dinner. Pt also reports she may consume a midday snack of yogurt. Pt was consuming Boost daily however states she has been unable to go buy them at the store. Pt amenable to supplementation while admitted.   Pt reports 45 lb weight loss over the past year. Per chart, pt shows slight decline in weight however not as significant. Pt weighed 154 lb on 02/03/17. This is 7.8% weight loss in 1 year, not significant for time frame.   Labs reviewed; K 2.5, Magnesium 1.5 Medications reviewed; Solu-medrol, Protonix, potassium chloride  NUTRITION - FOCUSED PHYSICAL EXAM:    Most Recent Value  Orbital Region  No depletion  Upper Arm Region  No depletion  Thoracic and Lumbar Region  No depletion  Buccal Region  No depletion  Temple Region  Mild depletion  Clavicle Bone Region  Mild depletion  Clavicle and Acromion Bone Region  Mild depletion  Scapular Bone Region  Unable to assess   Dorsal Hand  Unable to assess  Patellar Region  No depletion  Anterior Thigh Region  Mild depletion  Posterior Calf Region  No depletion  Edema (RD Assessment)  None      Diet Order:  Diet Heart Room service appropriate? Yes; Fluid consistency: Thin  EDUCATION NEEDS:   No education needs have been identified at this time  Skin:  Skin Assessment: Reviewed RN Assessment  Last BM:  Unknown BM Date  Height:   Ht Readings from Last 1 Encounters:  01/01/18 5\' 3"  (1.6 m)   Weight:   Wt Readings from Last 1 Encounters:  01/01/18 142 lb 6.7 oz (64.6 kg)   Ideal Body Weight:  52.3 kg  BMI:  Body mass index is 25.23 kg/m.  Estimated Nutritional Needs:   Kcal:  1600-1800  Protein:  80-90 grams  Fluid:  >/= 1.6 L/d  [place] Ranger, MS, RDN, LDN 01/02/2018 4:35 PM

## 2018-01-02 NOTE — NC FL2 (Signed)
[place] LEVEL OF CARE SCREENING TOOL     IDENTIFICATION  Patient Name: GRACIEMAE DELISLE Birthdate: June 21, 1946 Sex: female Admission Date (Current Location): 01/01/2018  [facility] and [place] Number:  Herbalist and Address:  [place], [place] [address] [place], [place] [address]      Provider Number: [id number]  Attending Physician Name and Address:  Samuella Cota, MD  Relative Name and Phone Number:  Jaidan, Stachnik; [phone]     Current Level of Care: Hospital Recommended Level of Care: [place] Prior Approval Number:    Date Approved/Denied:   PASRR Number: (Submitted for PASRR 01/02/18)  Discharge Plan: SNF    Current Diagnoses: Patient Active Problem List   Diagnosis Date Noted  . Influenza A 01/02/2018  . Chronic respiratory failure with hypoxia ([place]) 01/02/2018  . Hypomagnesemia 01/02/2018  . Acute bronchitis 01/02/2018  . Failure to thrive in adult 01/01/2018  . Altered mental status 10/21/2017  . Fall 10/21/2017  . Lumbar radiculitis 02/10/2017  . Dehydration 12/04/2016  . Low serum erythropoietin level 10/17/2016  . RBBB 09/23/2016  . Primary osteoarthritis of left knee 06/19/2016  . Debilitated patient 06/06/2016  . Acute on chronic diastolic CHF (congestive heart failure), NYHA class 3 ([place]) 05/30/2016  . SOB (shortness of breath)   . [name] ([place])   . Rotator cuff syndrome of right shoulder 01/10/2016  . UTI (urinary tract infection) 01/01/2016  . Encephalopathy, metabolic 41/66/0630  . CAP (community acquired pneumonia) 01/01/2016  . Fall at home 01/01/2016  . Rhabdomyolysis 01/01/2016  . Diastolic heart failure ([place]) 10/16/2015  . CAD-minor 2014 08/16/2015  . Chest pain with moderate risk for cardiac etiology 08/16/2015  . Narrowing of intervertebral disc space 07/17/2015  . Bilateral lower leg pain 01/24/2015  . 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 Chronic  pain syndrome 11/21/2014  . Anxiety and depression 11/21/2014  . Abnormal grief reaction 11/21/2014  . Severe persistent asthma 11/21/2014  . Hypokalemia 11/21/2014  . Hyperlipidemia 11/21/2014  . Osteoarthritis of right knee 10/18/2014  . Pernicious anemia 08/24/2014  . Generalized anxiety disorder--with occasional panic attacks.  08/05/2014  . Recurrent major depression-severe ([place]) 08/05/2014  . Multifactorial gait disorder 07/26/2014  . Epistaxis 07/18/2014  . Acute GI bleeding 07/17/2014  . Anemia associated with acute blood loss 07/17/2014  . Syncope 07/17/2014  . History of pulmonary embolism 07/17/2014  . GI bleed 07/17/2014  . Arthritis 05/11/2014  . DDD (degenerative disc disease) 05/11/2014  . Fibrositis 05/11/2014  . Amianthosis ([place]) 05/11/2014  . Grief 04/28/2014  . OSA (obstructive sleep apnea) 04/24/2014  . Chronic diastolic CHF, NYHA class 1 04/24/2014  . Acute pulmonary embolism ([place]) 08/13/2013  . Pleural plaque with presence of asbestos 07/22/2013  . Polypharmacy 04/26/2013  . Fibromyalgia syndrome 03/01/2013  . Insomnia 11/12/2012  . HTN (hypertension), benign 10/25/2012    Orientation RESPIRATION BLADDER Height & Weight     Self, Time, Situation, Place  Normal Continent Weight: 142 lb 6.7 oz (64.6 kg) Height:  5\' 3"  (160 cm)  BEHAVIORAL SYMPTOMS/MOOD NEUROLOGICAL BOWEL NUTRITION STATUS      Continent Diet(Heart healthy)  AMBULATORY STATUS COMMUNICATION OF NEEDS Skin   Total Care(Patient was unable to ambulate with PT during evaluation) Verbally Skin abrasions, Other (Comment)(Ecchymosis right jaw and abrasion right jaw)                       Personal Care  Assistance Level of Assistance  Bathing, Feeding, Dressing Bathing Assistance: Limited assistance Feeding assistance: Independent Dressing Assistance: Limited assistance     Functional Limitations Info  Sight, Hearing, Speech Sight Info: Adequate Hearing Info: Adequate Speech Info:  Adequate    SPECIAL CARE FACTORS FREQUENCY  PT (By licensed PT)     PT Frequency: Evaluated 1/11 and a minimum of 3X per week therapy recommended during acute inpatient stay              Contractures Contractures Info: Not present    Additional Factors Info  Code Status, Allergies Code Status Info: Full Allergies Info: Abilify, Penicillins           Current Medications (01/02/2018):  This is the current hospital active medication list Current Facility-Administered Medications  Medication Dose Route Frequency Provider Last Rate Last Dose  . 0.9 %  sodium chloride infusion   Intravenous Continuous Radene Gunning, NP 50 mL/hr at 01/01/18 1834    . acetaminophen (TYLENOL) tablet 650 mg  650 mg Oral Q6H PRN Radene Gunning, NP   650 mg at 01/01/18 1741   Or  . acetaminophen (TYLENOL) suppository 650 mg  650 mg Rectal Q6H PRN Radene Gunning, NP      . ALPRAZolam Duanne Moron) tablet 0.25 mg  0.25 mg Oral TID PRN Radene Gunning, NP   0.25 mg at 01/01/18 2233  . aspirin EC tablet 81 mg  81 mg Oral QHS Radene Gunning, NP   81 mg at 01/01/18 2233  . azithromycin (ZITHROMAX) tablet 500 mg  500 mg Oral Daily Radene Gunning, NP   500 mg at 01/02/18 0906  . bisacodyl (DULCOLAX) EC tablet 5 mg  5 mg Oral Daily PRN Radene Gunning, NP      . DULoxetine (CYMBALTA) DR capsule 90 mg  90 mg Oral QHS Radene Gunning, NP   90 mg at 01/01/18 2233  . enoxaparin (LOVENOX) injection 40 mg  40 mg Subcutaneous Q24H Radene Gunning, NP   40 mg at 01/01/18 1742  . guaiFENesin (MUCINEX) 12 hr tablet 600 mg  600 mg Oral BID Radene Gunning, NP   600 mg at 01/02/18 0906  . ibuprofen (ADVIL,MOTRIN) tablet 800 mg  800 mg Oral Q8H PRN Radene Gunning, NP      . isosorbide mononitrate (IMDUR) 24 hr tablet 30 mg  30 mg Oral QPM Lady Deutscher, MD   30 mg at 01/01/18 2023  . isosorbide mononitrate (IMDUR) 24 hr tablet 60 mg  60 mg Oral Daily Lady Deutscher, MD   60 mg at 01/02/18 0906  . levalbuterol (XOPENEX)  nebulizer solution 0.63 mg  0.63 mg Nebulization Q6H PRN Samuella Cota, MD      . methylPREDNISolone sodium succinate (SOLU-MEDROL) 40 mg/mL injection 40 mg  40 mg Intravenous Q12H Radene Gunning, NP   40 mg at 01/02/18 0341  . nitroGLYCERIN (NITROSTAT) SL tablet 0.4 mg  0.4 mg Sublingual Q5 min PRN Radene Gunning, NP      . ondansetron [facility]) tablet 4 mg  4 mg Oral Q6H PRN Radene Gunning, NP   4 mg at 01/02/18 0917   Or  . ondansetron (ZOFRAN) injection 4 mg  4 mg Intravenous Q6H PRN Radene Gunning, NP   4 mg at 01/01/18 2234  . oseltamivir (TAMIFLU) capsule 30 mg  30 mg Oral BID Samuella Cota, MD   30 mg at 01/02/18 1242  .  oxyCODONE-acetaminophen (PERCOCET/ROXICET) 5-325 MG per tablet 1 tablet  1 tablet Oral Q6H PRN Lady Deutscher, MD   1 tablet at 01/02/18 7026   And  . oxyCODONE (Oxy IR/ROXICODONE) immediate release tablet 5 mg  5 mg Oral Q6H PRN Lady Deutscher, MD   5 mg at 01/02/18 0341  . pantoprazole (PROTONIX) EC tablet 40 mg  40 mg Oral Daily Radene Gunning, NP   40 mg at 01/02/18 0906  . polyethylene glycol (MIRALAX / GLYCOLAX) packet 17 g  17 g Oral Daily PRN Black, Karen M, NP      . potassium chloride SA (K-DUR,KLOR-CON) CR tablet 40 mEq  40 mEq Oral Q4H Samuella Cota, MD      . sodium chloride (OCEAN) 0.65 % nasal spray 1-2 spray  1-2 spray Each Nare PRN Radene Gunning, NP      . traZODone (DESYREL) tablet 150 mg  150 mg Oral QHS Samuella Cota, MD       Facility-Administered Medications Ordered in Other Encounters  Medication Dose Route Frequency Provider Last Rate Last Dose  . ferumoxytol (FERAHEME) 510 mg in sodium chloride 0.9 % 100 mL IVPB  510 mg Intravenous Once Volanda Napoleon, MD         Discharge Medications: Please see discharge summary for a list of discharge medications.  Relevant Imaging Results:  Relevant Lab Results:   Additional Information [phone]  Sable Feil, LCSW

## 2018-01-02 NOTE — Clinical Social Work Note (Signed)
Clinical Social Work Assessment  Patient Details  Name: Linda [name] MRN: [id number] Date of Birth: 02-15-1946  Date of referral:  01/02/18               Reason for consult:  Facility Placement                Permission sought to share information with:  Family Supports Permission granted to share information::  Yes, Verbal Permission Granted  Name::     [place]::     Relationship::  Daughter-in-law  Contact Information:  [phone]  Housing/Transportation Living arrangements for the past 2 months:  Independent Living Facility([facility]) Source of Information:  Patient, Other (Comment Required)(Chart review) Patient Interpreter Needed:  None Criminal Activity/Legal Involvement Pertinent to Current Situation/Hospitalization:  No - Comment as needed Significant Relationships:  Adult Children, Other Family Members Lives with:  Self Do you feel safe going back to the place where you live?  Yes Need for family participation in patient care:  Yes (Comment)  Care giving concerns:  Patient reported that she currently has [place] [facility] through [place] 3 times a week .  Social Worker assessment / plan:  CSW talked with patient at the bedside regarding discharge plan and recommendation by PT of SNF. Linda [name] was lying in bed,  awake, alert and oriented and patient was agreeable to talking with CSW.  Patient is currently on oxygen in the hospital and at home, but explained that she is weaning herself off of oxygen at home. Linda [name] talked to [place] about her family and reported that she has a daughter that lives in [place], [place]. And a son who lives in [place]. She reported that her son has MS and is sick right now and her 2 grandchildren are also sick with a sinus infection and respiratory problems respectively. Her daughter-in-law and be called once she is ready for discharge and she may be able to transport her back to her  apartment and if not, she is agreeable to non-emergency ambulance.  Linda [name] reported that she gets 3 meals a day provided and once a week house cleaning. She goes to the dining hall for meals or can pay to have her meals brought to her. Patient added that for the first 3 days after discharge from the hospital they will bring her meals to her for 3 days and she will not be charged. Linda [name] also indicated that they are called everyday and if they don't respond by pushing a button in their apartment, someone comes to check on them. When asked, patient responded that she does not have anyone that can come and stay with her for a few days after discharge from hospital. Linda [name] expressed that she will be fine at home with the services she has in place and declined SNF at discharge.  Employment status:  Retired Forensic scientist:  Medicare, Engineer, agricultural Other) PT Recommendations:  [place] / Referral to community resources:  Other (Comment Required)(Patient declined SNF placement)  Patient/Family's Response to care:  Patient had no concerns regarding her care during hospitalization.  Patient/Family's Understanding of and Emotional Response to Diagnosis, Current Treatment, and Prognosis: Patient appeared knowledgeable about her illness and reason for hospitalization.  Emotional Assessment Appearance:  Appears stated age Attitude/Demeanor/Rapport:  Other(Appropriate) Affect (typically observed):  Appropriate, Pleasant Orientation:  Oriented to Self, Oriented to Situation, Oriented to Place, Oriented to  Time Alcohol / Substance  use:  Never Used Psych involvement (Current and /or in the community):  No (Comment)  Discharge Needs  Concerns to be addressed:  Discharge Planning Concerns Readmission within the last 30 days:  No Current discharge risk:  None Barriers to Discharge:  Continued Medical Work up   [employer] Linda [name], Linda [name] 01/02/2018,  2:29 PM

## 2018-01-02 NOTE — Progress Notes (Signed)
  PROGRESS NOTE  Linda [name]:[id number] DOB: February 03, 1946 DOA: 01/01/2018 PCP: Tammi Sou, MD  Brief Narrative: 55yow PMH COPD, home oxygen, chronic pain presented with generalized weakness, hypotension, bronchitis. Admitted for dehydration, hypotension   Assessment/Plan Influenza A. CXR NAD. - afebrile, VSS but SOB and appears ill - Tamiflu x5 days. Continue droplet precautions  AKI, hypotension, dehydration secondary to diuretics, poor oral intake - secondary to above - AKI resolved; continue IVF until PO intake improves  Hypokalemia, hypomagnesemia - replete; repeat BMP and Mg in AM  Acute bronchitis.  - azithromycin x 5days  [name], asthma, chronic hypoxic resp failure on 3 L  - continue supplemental oxygen  FTT with reported unintentional weight loss 45lbs last 12 monhts. - dietician consult - f/u with PCP as outpt for further evaluation  Chronic diastolic CHF - appears dry. Lasix on hold - follow volume status daily  DVT prophylaxis: enoxaparin Code Status: full Family Communication: none Disposition Plan: return to independent living at [facility], [place]  [facility] Direct contact: [phone] --[place]  --[email or web address]; password [email or web address]  7PM-7AM contact night coverage as above 01/02/2018, 12:08 PM  LOS: 1 day   Consultants:    Procedures:    Antimicrobials:  Azithromycin 1/10 >>  Interval history/Subjective: Feels weak. Coughing. Tremulous with albuterol neb. Chronic pain. Didn't sleep well last night.  Objective: Vitals:  Vitals:   01/02/18 0739 01/02/18 0925  BP:  (!) 146/56  Pulse:  (!) 110  Resp:  16  Temp:  98 F (36.7 C)  SpO2: 97% 99%    Exam:  Constitutional:  . Appears calm, uncomfortable, tremulous but not toxic Respiratory:  . CTA bilaterally, no w/r/r.  . Respiratory effort mildly increased Cardiovascular:  . RRR, no m/r/g . No LE extremity edema   Abdomen:   . Abdomen appears normal; no tenderness or masses . No hernias . No HSM Psychiatric:  . Mental status o Mood, affect appropriate  I have personally reviewed the following:   Labs:  K+ 2.5  Mg 1.5  Creatinine 1.2 >> 0.88  Troponins negative  Influenza A positive  Imaging studies:  CXR no acute disease  Medical tests:  EKG SR, RBBB old compared to 10/21/2017    Scheduled Meds: . aspirin EC  81 mg Oral QHS  . azithromycin  500 mg Oral Daily  . DULoxetine  90 mg Oral QHS  . enoxaparin (LOVENOX) injection  40 mg Subcutaneous Q24H  . guaiFENesin  600 mg Oral BID  . isosorbide mononitrate  30 mg Oral QPM  . isosorbide mononitrate  60 mg Oral Daily  . methylPREDNISolone (SOLU-MEDROL) injection  40 mg Intravenous Q12H  . oseltamivir  30 mg Oral BID  . pantoprazole  40 mg Oral Daily  . potassium chloride SA  40 mEq Oral Q4H  . traZODone  150 mg Oral QHS   Continuous Infusions: . sodium chloride 50 mL/hr at 01/01/18 1834  . magnesium sulfate 1 - 4 g bolus IVPB      Principal Problem:   Influenza A Active Problems:   Chronic diastolic CHF, NYHA class 1   Chronic pain syndrome   Anxiety and depression   Hypokalemia   [name] ([place])   Dehydration   Failure to thrive in adult   Chronic respiratory failure with hypoxia ([facility])   Hypomagnesemia   Acute bronchitis   LOS: 1 day

## 2018-01-02 NOTE — Care Management Note (Addendum)
Case Management Note  Patient Details  Name: Linda [name] MRN: [id number] Date of Birth: [birth year]  Subjective/Objective:                 Patient admitted from [facility] for hypokalemia, Hx COPD, [name], currently on 2L, no home oxygen. + FLU. Patient states she is active w [facility]. LM w [facility]ch Penny [facility] to notify her that patient is admitted.    Action/Plan:  CM will continue to follow.  Expected Discharge Date:                  Expected Discharge Plan:  Assisted Living / Rest Home  In-House Referral:  Clinical Social Work  Discharge planning Services  CM Consult  Post Acute Care Choice:    Choice offered to:     DME Arranged:    DME Agency:     HH Arranged:    HH Agency:     Status of Service:  In process, will continue to follow  If discussed at Long Length of Stay Meetings, dates discussed:    Additional Comments:  Carles Collet, RN 01/02/2018, 10:33 AM

## 2018-01-02 NOTE — Progress Notes (Signed)
[facility]  Patient Status: Active (receiving services up to time of hospitalization)  [facility] is providing the following services: RN, PT and OT  If patient discharges after hours, please call [phone].   Linda [name] 01/02/2018, 3:27 PM

## 2018-01-02 NOTE — Progress Notes (Signed)
Critical lab KCL 2.5 paged Dr. Sarajane Jews pt on KCL 40 meq daily and 20 meq @ 1800, FYI.

## 2018-01-02 NOTE — Evaluation (Signed)
Physical Therapy Evaluation Patient Details Name: Linda [name] MRN: [id number] DOB: 05/17/1946 Today's Date: 01/02/2018   History of Present Illness  Pt is a 72 y/o female admitted secondary to weakness and found to be hypokalemic. PMH including but not limited to CAD, COPD, diastolic HF and HTN.    Clinical Impression  Pt presented supine in bed with HOB elevated, awake and willing to participate in therapy session. Prior to admission, pt reported that she was living in an ILF, ambulating with use of RW and independent with ADLs. Pt currently very limited secondary to fatigue. Pt performed bed mobility with modified independent and transfers with min guard for safety. Pt would benefit from ST post-acute rehab prior to returning home alone to maximize her independence with functional mobility. Pt would continue to benefit from skilled physical therapy services at this time while admitted and after d/c to address the below listed limitations in order to improve overall safety and independence with functional mobility.     Follow Up Recommendations SNF;Supervision/Assistance - 24 hour;Other (comment)(for ST rehab)    Equipment Recommendations  None recommended by PT    Recommendations for Other Services       Precautions / Restrictions Precautions Precautions: Fall Precaution Comments: Droplet Restrictions Weight Bearing Restrictions: No      Mobility  Bed Mobility Overal bed mobility: Modified Independent                Transfers Overall transfer level: Needs assistance Equipment used: None Transfers: Sit to/from Stand;Stand Pivot Transfers Sit to Stand: Min guard Stand pivot transfers: Min guard       General transfer comment: increased time and effort, min guard for safety  Ambulation/Gait                Stairs            Wheelchair Mobility    Modified Rankin (Stroke Patients Only)       Balance Overall balance assessment: Needs  assistance Sitting-balance support: Feet supported Sitting balance-Leahy Scale: Fair     Standing balance support: During functional activity;Single extremity supported Standing balance-Leahy Scale: Poor                               Pertinent Vitals/Pain Pain Assessment: No/denies pain    Home Living Family/patient expects to be discharged to:: Private residence Living Arrangements: Alone Available Help at Discharge: Other (Comment)(Independent Living Facility) Type of Home: Independent living facility Home Access: [place]: One level Home Equipment: [place] - 2 wheels;Bedside commode;Hand held shower head;Shower seat      Prior Function Level of Independence: Independent with assistive device(s)         Comments: ambulates with RW and is independent with ADLs; pt reported that she was receiving HHPT 2-3x/week     Hand Dominance        Extremity/Trunk Assessment   Upper Extremity Assessment Upper Extremity Assessment: Generalized weakness(constant tremoring in bilateral UEs)    Lower Extremity Assessment Lower Extremity Assessment: Generalized weakness       Communication   Communication: No difficulties  Cognition Arousal/Alertness: Awake/alert Behavior During Therapy: WFL for tasks assessed/performed Overall Cognitive Status: Within Functional Limits for tasks assessed  General Comments      Exercises     Assessment/Plan    PT Assessment Patient needs continued PT services  PT Problem List Decreased strength;Decreased activity tolerance;Decreased balance;Decreased mobility;Decreased coordination       PT Treatment Interventions DME instruction;Gait training;Stair training;Functional mobility training;Therapeutic activities;Therapeutic exercise;Balance training;Neuromuscular re-education;Patient/family education    PT Goals (Current goals can be found in the Care  Plan section)  Acute Rehab PT Goals Patient Stated Goal: to feel better PT Goal Formulation: With patient Time For Goal Achievement: 01/16/18 Potential to Achieve Goals: Good    Frequency Min 3X/week   Barriers to discharge        Co-evaluation               AM-PAC PT "6 Clicks" Daily Activity  Outcome Measure Difficulty turning over in bed (including adjusting bedclothes, sheets and blankets)?: None Difficulty moving from lying on back to sitting on the side of the bed? : None Difficulty sitting down on and standing up from a chair with arms (e.g., wheelchair, bedside commode, etc,.)?: A Little Help needed moving to and from a bed to chair (including a wheelchair)?: A Little Help needed walking in hospital room?: A Little Help needed climbing 3-5 steps with a railing? : A Lot 6 Click Score: 19    End of Session Equipment Utilized During Treatment: Oxygen Activity Tolerance: Patient limited by fatigue Patient left: in bed;with call bell/phone within reach;with bed alarm set;Other (comment)(Dietician present) Nurse Communication: Mobility status PT Visit Diagnosis: Other abnormalities of gait and mobility (R26.89);Muscle weakness (generalized) (M62.81)    Time: 1040-1053 PT Time Calculation (min) (ACUTE ONLY): 13 min   Charges:   PT Evaluation $PT Eval Moderate Complexity: 1 Mod     PT G Codes:        [place], PT, DPT [place] 01/02/2018, 11:11 AM

## 2018-01-03 DIAGNOSIS — J101 Influenza due to other identified influenza virus with other respiratory manifestations: Principal | ICD-10-CM

## 2018-01-03 LAB — URINALYSIS, ROUTINE W REFLEX MICROSCOPIC
Bilirubin Urine: NEGATIVE
Glucose, UA: NEGATIVE mg/dL
Ketones, ur: NEGATIVE mg/dL
NITRITE: NEGATIVE
PH: 7 (ref 5.0–8.0)
Protein, ur: NEGATIVE mg/dL
Specific Gravity, Urine: 1.009 (ref 1.005–1.030)

## 2018-01-03 LAB — MAGNESIUM: Magnesium: 2.1 mg/dL (ref 1.7–2.4)

## 2018-01-03 LAB — BASIC METABOLIC PANEL
Anion gap: 7 (ref 5–15)
BUN: 10 mg/dL (ref 6–20)
CHLORIDE: 105 mmol/L (ref 101–111)
CO2: 27 mmol/L (ref 22–32)
CREATININE: 0.75 mg/dL (ref 0.44–1.00)
Calcium: 8.3 mg/dL — ABNORMAL LOW (ref 8.9–10.3)
GFR calc non Af Amer: >60 mL/min (ref >60)
Glucose, Bld: 133 mg/dL — ABNORMAL HIGH (ref 65–99)
Potassium: 4.4 mmol/L (ref 3.5–5.1)
Sodium: 139 mmol/L (ref 135–145)

## 2018-01-03 MED ORDER — OSELTAMIVIR PHOSPHATE 75 MG PO CAPS
75.0000 mg | ORAL_CAPSULE | Freq: Two times a day (BID) | ORAL | Status: DC
  Administered 2018-01-03 – 2018-01-06 (×7): 75 mg via ORAL
  Filled 2018-01-03 (×8): qty 1

## 2018-01-03 MED ORDER — AMLODIPINE BESYLATE 5 MG PO TABS
5.0000 mg | ORAL_TABLET | Freq: Every day | ORAL | Status: DC
  Administered 2018-01-03 – 2018-01-06 (×4): 5 mg via ORAL
  Filled 2018-01-03 (×4): qty 1

## 2018-01-03 NOTE — Clinical Social Work Note (Signed)
Pt declining SNF at d/c. Clinical Social Worker will sign off for now as social work intervention is no longer needed. Please consult [place] again if new need arises.   Linda [name] [name] 01/03/2018

## 2018-01-03 NOTE — Progress Notes (Addendum)
PROGRESS NOTE    Linda [name]  ZOX:[id number] DOB: 05-Apr-1946 DOA: 01/01/2018 PCP: Tammi Sou, MD   Brief Narrative: Patient is a 72 year old female with past medical history of chronic respiratory insufficiency, history of asbestosis on home oxygen, chronic pain syndrome who presented with generalized weakness, hypotension and respiratory distress.  Patient was found to have flu positive and currently being managed for that.    Assessment & Plan:   Principal Problem:   Influenza A Active Problems:   Chronic diastolic CHF, NYHA class 1   Chronic pain syndrome   Anxiety and depression   Hypokalemia   [name] ([place])   Dehydration   Failure to thrive in adult   Chronic respiratory failure with hypoxia (HCC)   Hypomagnesemia   Acute bronchitis  Influenza A: Chest x-ray did not show any pneumonia.  Patient remains afebrile.  Still appears weak.  Continue Tamiflu.  Continue droplet precaution  Acute kidney injury/hypotension/dehydration: Improving with IV fluids.  Hypokalemia/hypomagnesemia:  Supplemented and corrected.  Acute bronchitis: Continue azithromycin.  History of asbestosis/asthma/chronic respiratory failure: On 3 L oxygen via nasal cannula.  Continue supplemental oxygen.  Failure to thrive: Reported unintentional weight loss of 45 pounds in last 12 months.  Nutrition following.  Chronic diastolic CHF: Currently compensated.  Lasix on hold for AKI  Generalized weakness: Evaluated by physical therapy and recommended skilled nursing facility on discharge.  Patient denies it.  HTN: Home medications resumed.  Added amlodipine  DVT prophylaxis: Lovenox Code Status: Full Family Communication: None present at the bed side Disposition Plan: Return to independent living at [facility]   Consultants: None  Procedures:None  Antimicrobials:On Tamiflu and Azithro since 1/10  Subjective: Patient seen and examined at the bedside.  Still remains weak.   Complains of tremulousness due to bronchodilators.  Objective: Vitals:   01/02/18 1734 01/02/18 2150 01/03/18 0452 01/03/18 0954  BP: (!) 152/62 (!) 176/84 (!) 152/82 (!) 171/88  Pulse: 94 82 76 80  Resp: 18 16 18 18   Temp: 98 F (36.7 C) 97.7 F (36.5 C) 98 F (36.7 C) 98 F (36.7 C)  TempSrc: Oral Oral Oral Oral  SpO2: 99% 98% 96% 98%  Weight:      Height:        Intake/Output Summary (Last 24 hours) at 01/03/2018 1436 Last data filed at 01/03/2018 0900 Gross per 24 hour  Intake 1200 ml  Output 0 ml  Net 1200 ml   Filed Weights   01/01/18 1042 01/01/18 1054 01/01/18 2127  Weight: 60.8 kg (134 lb) 60.8 kg (134 lb) 64.6 kg (142 lb 6.7 oz)    Examination:  General exam: Appears calm and comfortable ,Not in distress,average built Respiratory system: Bilateral entry, occasional wheezes  cardiovascular system: S1 & S2 heard, RRR. No JVD, murmurs, rubs, gallops or clicks. No pedal edema. Gastrointestinal system: Abdomen is nondistended, soft and nontender. No organomegaly or masses felt. Normal bowel sounds heard. Central nervous system: Alert and oriented. No focal neurological deficits. Extremities: No edema, no clubbing ,no cyanosis, distal peripheral pulses palpable. Skin: No rashes, lesions or ulcers,no icterus ,no pallor Psychiatry: Judgement and insight appear normal. Mood & affect appropriate.     Data Reviewed: I have personally reviewed following labs and imaging studies  CBC: Recent Labs  Lab 01/01/18 1052  WBC 5.0  NEUTROABS 2.9  HGB 15.0  HCT 44.7  MCV 87.1  PLT 811   Basic Metabolic Panel: Recent Labs  Lab 01/01/18 1052 01/01/18 1635 01/01/18 2210 01/02/18  3664 01/03/18 0618  NA 138  --   --  140 139  K 2.4*  --   --  2.5* 4.4  CL 95*  --   --  106 105  CO2 31  --   --  23 27  GLUCOSE 136*  --   --  217* 133*  BUN 14  --   --  9 10  CREATININE 1.20* 1.00  --  0.88 0.75  CALCIUM 8.5*  --   --  8.5* 8.3*  MG  --   --  1.7 1.5* 2.1    GFR: Estimated Creatinine Clearance: 58.3 mL/min (by C-G formula based on SCr of 0.75 mg/dL). Liver Function Tests: Recent Labs  Lab 01/01/18 1052  AST 29  ALT 16  ALKPHOS 58  BILITOT 1.2  PROT 7.0  ALBUMIN 3.7   No results for input(s): LIPASE, AMYLASE in the last 168 hours. No results for input(s): AMMONIA in the last 168 hours. Coagulation Profile: No results for input(s): INR, PROTIME in the last 168 hours. Cardiac Enzymes: Recent Labs  Lab 01/01/18 1635 01/01/18 2210 01/02/18 0420  TROPONINI <0.03 <0.03 <0.03   BNP (last 3 results) No results for input(s): PROBNP in the last 8760 hours. HbA1C: No results for input(s): HGBA1C in the last 72 hours. CBG: No results for input(s): GLUCAP in the last 168 hours. Lipid Profile: No results for input(s): CHOL, HDL, LDLCALC, TRIG, CHOLHDL, LDLDIRECT in the last 72 hours. Thyroid Function Tests: No results for input(s): TSH, T4TOTAL, FREET4, T3FREE, THYROIDAB in the last 72 hours. Anemia Panel: No results for input(s): VITAMINB12, FOLATE, FERRITIN, TIBC, IRON, RETICCTPCT in the last 72 hours. Sepsis Labs: Recent Labs  Lab 01/01/18 1110 01/01/18 1313  LATICACIDVEN 2.64* 1.25    Recent Results (from the past 240 hour(s))  Respiratory Panel by PCR     Status: Abnormal   Collection Time: 01/01/18  3:44 PM  Result Value Ref Range Status   Adenovirus NOT DETECTED NOT DETECTED Final   Coronavirus 229E NOT DETECTED NOT DETECTED Final   Coronavirus HKU1 NOT DETECTED NOT DETECTED Final   Coronavirus NL63 NOT DETECTED NOT DETECTED Final   Coronavirus OC43 NOT DETECTED NOT DETECTED Final   Metapneumovirus NOT DETECTED NOT DETECTED Final   Rhinovirus / Enterovirus NOT DETECTED NOT DETECTED Final   Influenza A H1 2009 DETECTED (A) NOT DETECTED Final   Influenza B NOT DETECTED NOT DETECTED Final   Parainfluenza Virus 1 NOT DETECTED NOT DETECTED Final   Parainfluenza Virus 2 NOT DETECTED NOT DETECTED Final   Parainfluenza  Virus 3 NOT DETECTED NOT DETECTED Final   Parainfluenza Virus 4 NOT DETECTED NOT DETECTED Final   Respiratory Syncytial Virus NOT DETECTED NOT DETECTED Final   Bordetella pertussis NOT DETECTED NOT DETECTED Final   Chlamydophila pneumoniae NOT DETECTED NOT DETECTED Final   Mycoplasma pneumoniae NOT DETECTED NOT DETECTED Final  MRSA PCR Screening     Status: None   Collection Time: 01/01/18 11:37 PM  Result Value Ref Range Status   MRSA by PCR NEGATIVE NEGATIVE Final    Comment:        The GeneXpert MRSA Assay (FDA approved for NASAL specimens only), is one component of a comprehensive MRSA colonization surveillance program. It is not intended to diagnose MRSA infection nor to guide or monitor treatment for MRSA infections.          Radiology Studies: No results found.      Scheduled Meds: . amLODipine  5  mg Oral Daily  . aspirin EC  81 mg Oral QHS  . azithromycin  500 mg Oral Daily  . DULoxetine  90 mg Oral QHS  . enoxaparin (LOVENOX) injection  40 mg Subcutaneous Q24H  . feeding supplement (ENSURE ENLIVE)  237 mL Oral BID BM  . guaiFENesin  600 mg Oral BID  . isosorbide mononitrate  30 mg Oral QPM  . isosorbide mononitrate  60 mg Oral Daily  . methylPREDNISolone (SOLU-MEDROL) injection  40 mg Intravenous Q12H  . oseltamivir  75 mg Oral BID  . pantoprazole  40 mg Oral Daily  . traZODone  150 mg Oral QHS   Continuous Infusions:   LOS: 2 days    Time spent: 25 minutes    Sotero Brinkmeyer Jodie Echevaria, MD [facility] Pager [phone]  If 7PM-7AM, please contact night-coverage [email or web address] Password [facility] 01/03/2018, 2:36 PM

## 2018-01-04 MED ORDER — CIPROFLOXACIN HCL 500 MG PO TABS
500.0000 mg | ORAL_TABLET | Freq: Two times a day (BID) | ORAL | Status: DC
  Administered 2018-01-04 – 2018-01-05 (×2): 500 mg via ORAL
  Filled 2018-01-04 (×2): qty 1

## 2018-01-04 NOTE — Progress Notes (Addendum)
PROGRESS NOTE    Linda [name]  VFI:[id number] DOB: 1945/12/25 DOA: 01/01/2018 PCP: Tammi Sou, MD   Brief Narrative: Patient is a 72 year old female with past medical history of chronic respiratory insufficiency, history of asbestosis on home oxygen, chronic pain syndrome who presented with generalized weakness, hypotension and respiratory distress.  Patient was found to have flu positive and currently being managed for that.    Assessment & Plan:   Principal Problem:   Influenza A Active Problems:   Chronic diastolic CHF, NYHA class 1   Chronic pain syndrome   Anxiety and depression   Hypokalemia   [name] ([place])   Dehydration   Failure to thrive in adult   Chronic respiratory failure with hypoxia (HCC)   Hypomagnesemia   Acute bronchitis  Influenza A: Chest x-ray did not show any pneumonia.  Patient remains afebrile.  Still appears weak.  Continue Tamiflu.  Continue droplet precaution  Acute kidney injury/hypotension/dehydration: Resolved with IV fluids.  Hypokalemia/hypomagnesemia:  Supplemented and corrected.  Acute bronchitis: Continue azithromycin.  History of asbestosis/asthma/chronic respiratory failure: On 3 L oxygen via nasal cannula.  Continue supplemental oxygen.  Failure to thrive: Reported unintentional weight loss of 45 pounds in last 12 months.  Nutrition following.  Chronic diastolic CHF: Currently compensated.  Lasix on hold for AKI  Generalized weakness: Evaluated by physical therapy and recommended skilled nursing facility on discharge.  Patient denies it.  HTN: Home medications resumed.  Added amlodipine.BP stable this morning  UTI: Patient complained of foul-smelling urine and it was also dark in color.  She had history of recurrent urinary tract infection sensitive to ciprofloxacin.  UA done here showed bacteria.  Will check urine culture.  Started on Cipro.  DVT prophylaxis: Lovenox Code Status: Full Family Communication: None  present at the bed side Disposition Plan: Return to independent living at [facility]   Consultants: None  Procedures:None  Antimicrobials:On Tamiflu and Azithro since 1/10  Subjective: Patient seen and examined the bedside this morning.  Respiratory status has improved.  But she still remains weak.  Continues to deny discharge to skilled nursing facility.. Objective: Vitals:   01/03/18 2117 01/03/18 2359 01/04/18 0433 01/04/18 0923  BP: (!) 162/78  139/79 136/86  Pulse: 84  93 88  Resp: 16  18 18   Temp: 98.4 F (36.9 C)  97.7 F (36.5 C) 97.6 F (36.4 C)  TempSrc: Oral  Oral Oral  SpO2: 95%  100% 98%  Weight:  62 kg (136 lb 9.6 oz)    Height:        Intake/Output Summary (Last 24 hours) at 01/04/2018 1104 Last data filed at 01/04/2018 4166 Gross per 24 hour  Intake 1200 ml  Output 1050 ml  Net 150 ml   Filed Weights   01/01/18 1054 01/01/18 2127 01/03/18 2359  Weight: 60.8 kg (134 lb) 64.6 kg (142 lb 6.7 oz) 62 kg (136 lb 9.6 oz)    Examination:  General exam: Appears calm and comfortable ,Not in distress,average built Respiratory system: Bilateral decreased air entry, mild expiratory wheezes  cardiovascular system: S1 & S2 heard, RRR. No JVD, murmurs, rubs, gallops or clicks. No pedal edema. Gastrointestinal system: Abdomen is nondistended, soft and nontender. No organomegaly or masses felt. Normal bowel sounds heard. Central nervous system: Alert and oriented. No focal neurological deficits. Extremities: No edema, no clubbing ,no cyanosis, distal peripheral pulses palpable. Skin: No cyanosis,No pallor,No Rash,No Ulcer Psychiatry: Judgement and insight appear normal. Mood & affect appropriate.  Data Reviewed: I have personally reviewed following labs and imaging studies  CBC: Recent Labs  Lab 01/01/18 1052  WBC 5.0  NEUTROABS 2.9  HGB 15.0  HCT 44.7  MCV 87.1  PLT 621   Basic Metabolic Panel: Recent Labs  Lab 01/01/18 1052 01/01/18 1635  01/01/18 2210 01/02/18 0850 01/03/18 0618  NA 138  --   --  140 139  K 2.4*  --   --  2.5* 4.4  CL 95*  --   --  106 105  CO2 31  --   --  23 27  GLUCOSE 136*  --   --  217* 133*  BUN 14  --   --  9 10  CREATININE 1.20* 1.00  --  0.88 0.75  CALCIUM 8.5*  --   --  8.5* 8.3*  MG  --   --  1.7 1.5* 2.1   GFR: Estimated Creatinine Clearance: 53.4 mL/min (by C-G formula based on SCr of 0.75 mg/dL). Liver Function Tests: Recent Labs  Lab 01/01/18 1052  AST 29  ALT 16  ALKPHOS 58  BILITOT 1.2  PROT 7.0  ALBUMIN 3.7   No results for input(s): LIPASE, AMYLASE in the last 168 hours. No results for input(s): AMMONIA in the last 168 hours. Coagulation Profile: No results for input(s): INR, PROTIME in the last 168 hours. Cardiac Enzymes: Recent Labs  Lab 01/01/18 1635 01/01/18 2210 01/02/18 0420  TROPONINI <0.03 <0.03 <0.03   BNP (last 3 results) No results for input(s): PROBNP in the last 8760 hours. HbA1C: No results for input(s): HGBA1C in the last 72 hours. CBG: No results for input(s): GLUCAP in the last 168 hours. Lipid Profile: No results for input(s): CHOL, HDL, LDLCALC, TRIG, CHOLHDL, LDLDIRECT in the last 72 hours. Thyroid Function Tests: No results for input(s): TSH, T4TOTAL, FREET4, T3FREE, THYROIDAB in the last 72 hours. Anemia Panel: No results for input(s): VITAMINB12, FOLATE, FERRITIN, TIBC, IRON, RETICCTPCT in the last 72 hours. Sepsis Labs: Recent Labs  Lab 01/01/18 1110 01/01/18 1313  LATICACIDVEN 2.64* 1.25    Recent Results (from the past 240 hour(s))  Respiratory Panel by PCR     Status: Abnormal   Collection Time: 01/01/18  3:44 PM  Result Value Ref Range Status   Adenovirus NOT DETECTED NOT DETECTED Final   Coronavirus 229E NOT DETECTED NOT DETECTED Final   Coronavirus HKU1 NOT DETECTED NOT DETECTED Final   Coronavirus NL63 NOT DETECTED NOT DETECTED Final   Coronavirus OC43 NOT DETECTED NOT DETECTED Final   Metapneumovirus NOT DETECTED  NOT DETECTED Final   Rhinovirus / Enterovirus NOT DETECTED NOT DETECTED Final   Influenza A H1 2009 DETECTED (A) NOT DETECTED Final   Influenza B NOT DETECTED NOT DETECTED Final   Parainfluenza Virus 1 NOT DETECTED NOT DETECTED Final   Parainfluenza Virus 2 NOT DETECTED NOT DETECTED Final   Parainfluenza Virus 3 NOT DETECTED NOT DETECTED Final   Parainfluenza Virus 4 NOT DETECTED NOT DETECTED Final   Respiratory Syncytial Virus NOT DETECTED NOT DETECTED Final   Bordetella pertussis NOT DETECTED NOT DETECTED Final   Chlamydophila pneumoniae NOT DETECTED NOT DETECTED Final   Mycoplasma pneumoniae NOT DETECTED NOT DETECTED Final  MRSA PCR Screening     Status: None   Collection Time: 01/01/18 11:37 PM  Result Value Ref Range Status   MRSA by PCR NEGATIVE NEGATIVE Final    Comment:        The GeneXpert MRSA Assay (FDA approved for NASAL specimens  only), is one component of a comprehensive MRSA colonization surveillance program. It is not intended to diagnose MRSA infection nor to guide or monitor treatment for MRSA infections.          Radiology Studies: No results found.      Scheduled Meds: . amLODipine  5 mg Oral Daily  . aspirin EC  81 mg Oral QHS  . azithromycin  500 mg Oral Daily  . DULoxetine  90 mg Oral QHS  . enoxaparin (LOVENOX) injection  40 mg Subcutaneous Q24H  . feeding supplement (ENSURE ENLIVE)  237 mL Oral BID BM  . guaiFENesin  600 mg Oral BID  . isosorbide mononitrate  30 mg Oral QPM  . isosorbide mononitrate  60 mg Oral Daily  . methylPREDNISolone (SOLU-MEDROL) injection  40 mg Intravenous Q12H  . oseltamivir  75 mg Oral BID  . pantoprazole  40 mg Oral Daily  . traZODone  150 mg Oral QHS   Continuous Infusions:   LOS: 3 days    Time spent: 25 minutes    Zaim Nitta Jodie Echevaria, MD [facility] Pager [phone]  If 7PM-7AM, please contact night-coverage [email or web address] Password [facility] 01/04/2018, 11:04 AM

## 2018-01-05 LAB — CBC WITH DIFFERENTIAL/PLATELET
BASOS PCT: 0 %
Basophils Absolute: 0 10*3/uL (ref 0.0–0.1)
EOS ABS: 0 10*3/uL (ref 0.0–0.7)
EOS PCT: 0 %
HCT: 42.1 % (ref 36.0–46.0)
Hemoglobin: 13.9 g/dL (ref 12.0–15.0)
Lymphocytes Relative: 15 %
Lymphs Abs: 1.2 10*3/uL (ref 0.7–4.0)
MCH: 29.1 pg (ref 26.0–34.0)
MCHC: 33 g/dL (ref 30.0–36.0)
MCV: 88.3 fL (ref 78.0–100.0)
MONO ABS: 0.6 10*3/uL (ref 0.1–1.0)
MONOS PCT: 8 %
Neutro Abs: 5.9 10*3/uL (ref 1.7–7.7)
Neutrophils Relative %: 77 %
PLATELETS: 182 10*3/uL (ref 150–400)
RBC: 4.77 MIL/uL (ref 3.87–5.11)
RDW: 12.3 % (ref 11.5–15.5)
WBC: 7.7 10*3/uL (ref 4.0–10.5)

## 2018-01-05 LAB — BASIC METABOLIC PANEL
Anion gap: 11 (ref 5–15)
BUN: 19 mg/dL (ref 6–20)
CALCIUM: 8.8 mg/dL — AB (ref 8.9–10.3)
CO2: 28 mmol/L (ref 22–32)
CREATININE: 0.6 mg/dL (ref 0.44–1.00)
Chloride: 98 mmol/L — ABNORMAL LOW (ref 101–111)
GFR calc non Af Amer: >60 mL/min (ref >60)
Glucose, Bld: 109 mg/dL — ABNORMAL HIGH (ref 65–99)
Potassium: 3.4 mmol/L — ABNORMAL LOW (ref 3.5–5.1)
SODIUM: 137 mmol/L (ref 135–145)

## 2018-01-05 MED ORDER — PREDNISONE 20 MG PO TABS
40.0000 mg | ORAL_TABLET | Freq: Every day | ORAL | Status: DC
  Administered 2018-01-06: 40 mg via ORAL
  Filled 2018-01-05: qty 2

## 2018-01-05 MED ORDER — CEPHALEXIN 500 MG PO CAPS
500.0000 mg | ORAL_CAPSULE | Freq: Two times a day (BID) | ORAL | Status: DC
  Administered 2018-01-05 – 2018-01-06 (×2): 500 mg via ORAL
  Filled 2018-01-05 (×2): qty 1

## 2018-01-05 NOTE — Progress Notes (Signed)
Physical Therapy Treatment Patient Details Name: Linda [name] MRN: [id number] DOB: 25-Aug-1946 Today's Date: 01/05/2018    History of Present Illness Pt is a 72 y/o female admitted secondary to weakness and found to be hypokalemic. PMH including but not limited to CAD, COPD, diastolic HF and HTN.    PT Comments    Patient seen for activity progression. Improved overall mobility today but remains limited by fatigue. Ambulated in room and tolerated functional task performance well.  Progressing towards PT goals. Will continue to see and progress as tolerated.  Follow Up Recommendations  Home health PT;Supervision/Assistance - 24 hour    Equipment Recommendations  None recommended by PT    Recommendations for Other Services       Precautions / Restrictions Precautions Precautions: Fall Precaution Comments: Droplet Restrictions Weight Bearing Restrictions: No    Mobility  Bed Mobility Overal bed mobility: Modified Independent                Transfers Overall transfer level: Needs assistance Equipment used: None Transfers: Sit to/from Stand;Stand Pivot Transfers Sit to Stand: Supervision         General transfer comment: supervision for safety, no physical assist required  Ambulation/Gait Ambulation/Gait assistance: Supervision Ambulation Distance (Feet): 30 Feet Assistive device: Rolling walker (2 wheeled) Gait Pattern/deviations: Decreased stride length;Step-through pattern;Trunk flexed Gait velocity: decreased Gait velocity interpretation: Below normal speed for age/gender General Gait Details: patient steady with ambulation but demonstrates very limited activity tolerance. Ambulated on room air with desaturation to 89% and HR elevated to 129   Stairs            Wheelchair Mobility    Modified Rankin (Stroke Patients Only)       Balance Overall balance assessment: Needs assistance Sitting-balance support: Feet supported Sitting  balance-Leahy Scale: Fair     Standing balance support: During functional activity;Single extremity supported Standing balance-Leahy Scale: Fair                              Cognition Arousal/Alertness: Awake/alert Behavior During Therapy: WFL for tasks assessed/performed Overall Cognitive Status: Within Functional Limits for tasks assessed                                        Exercises      General Comments        Pertinent Vitals/Pain Pain Assessment: No/denies pain    Home Living                      Prior Function            PT Goals (current goals can now be found in the care plan section) Acute Rehab PT Goals Patient Stated Goal: to feel better PT Goal Formulation: With patient Time For Goal Achievement: 01/16/18 Potential to Achieve Goals: Good Progress towards PT goals: Progressing toward goals    Frequency    Min 3X/week      PT Plan Discharge plan needs to be updated    Co-evaluation              AM-PAC PT "6 Clicks" Daily Activity  Outcome Measure  Difficulty turning over in bed (including adjusting bedclothes, sheets and blankets)?: None Difficulty moving from lying on back to sitting on the side of the bed? : None Difficulty sitting down on  and standing up from a chair with arms (e.g., wheelchair, bedside commode, etc,.)?: A Little Help needed moving to and from a bed to chair (including a wheelchair)?: A Little Help needed walking in hospital room?: A Little Help needed climbing 3-5 steps with a railing? : A Lot 6 Click Score: 19    End of Session Equipment Utilized During Treatment: Oxygen Activity Tolerance: Patient limited by fatigue Patient left: in bed;with call bell/phone within reach;with bed alarm set;Other (comment) Nurse Communication: Mobility status PT Visit Diagnosis: Other abnormalities of gait and mobility (R26.89);Muscle weakness (generalized) (M62.81)     Time:  3500-9381 PT Time Calculation (min) (ACUTE ONLY): 17 min  Charges:  $Therapeutic Activity: 8-22 mins                    G Codes:       Alben Deeds, PT DPT  Board Certified Neurologic Specialist [place] 01/05/2018, 4:12 PM

## 2018-01-05 NOTE — Progress Notes (Signed)
PROGRESS NOTE    Linda [name]  BDZ:[id number] DOB: 01-31-1946 DOA: 01/01/2018 PCP: Tammi Sou, MD   Brief Narrative: Patient is a 71 year old female with past medical history of chronic respiratory insufficiency, history of asbestosis on home oxygen, chronic pain syndrome who presented with generalized weakness, hypotension and respiratory distress.  Patient was found to have flu positive and currently being managed for that.    Assessment & Plan:   Principal Problem:   Influenza A Active Problems:   Chronic diastolic CHF, NYHA class 1   Chronic pain syndrome   Anxiety and depression   Hypokalemia   [name] ([place])   Dehydration   Failure to thrive in adult   Chronic respiratory failure with hypoxia (HCC)   Hypomagnesemia   Acute bronchitis  Influenza A: Chest x-ray did not show any pneumonia.  Patient remains afebrile.  Still appears weak.  Continue Tamiflu.  Continue droplet precaution  Acute kidney injury/hypotension/dehydration: Resolved with IV fluids.  Hypokalemia/hypomagnesemia:  Supplemented and corrected.  Acute bronchitis: Completed course of  azithromycin.  History of asbestosis/asthma/chronic respiratory failure: On 3 L oxygen via nasal cannula at home .  Continue supplemental oxygen.  Failure to thrive: Reported unintentional weight loss of 45 pounds in last 12 months.  Nutrition following.  Chronic diastolic CHF: Currently compensated.  Lasix on hold for AKI  Generalized weakness: Evaluated by physical therapy and recommended skilled nursing facility on discharge.  Patient denies it.  HTN: Home medications resumed.  Added amlodipine.BP stable this morning  UTI: Patient complained of foul-smelling urine and it was also dark in color.  She had history of recurrent urinary tract infection sensitive to ciprofloxacin.  UA done here showed bacteria.  Urine culture sent .  Cipro changed to Keflex today. Urine culture growing E. Coli.  We will  follow-up the sensitivity.   DVT prophylaxis: Lovenox Code Status: Full Family Communication: None present at the bed side Disposition Plan: Return to independent living at [facility] tomorrow   Consultants: None  Procedures:None  Antimicrobials:On Tamiflu since 1/10.On Keflex since 01/05/17  Subjective: Patient seen and examined at bedside this morning.  Still looks weak.  Encouraged to sit on the chair and ambulate.  Respiratory status is  currently stable   Objective: Vitals:   01/04/18 1712 01/04/18 2147 01/05/18 0407 01/05/18 0758  BP: (!) 145/83 (!) 167/93 (!) 154/77 140/78  Pulse: 99 87 84 80  Resp: 18 18 16 17   Temp: 98.2 F (36.8 C) 97.8 F (36.6 C) 98.1 F (36.7 C) 98.4 F (36.9 C)  TempSrc: Oral Oral Oral Oral  SpO2: 99% 99% 96% 98%  Weight:  63.9 kg (140 lb 14 oz)    Height:        Intake/Output Summary (Last 24 hours) at 01/05/2018 1327 Last data filed at 01/05/2018 1059 Gross per 24 hour  Intake 640 ml  Output 1375 ml  Net -735 ml   Filed Weights   01/01/18 2127 01/03/18 2359 01/04/18 2147  Weight: 64.6 kg (142 lb 6.7 oz) 62 kg (136 lb 9.6 oz) 63.9 kg (140 lb 14 oz)    Examination:  General exam: Appears calm and comfortable ,Not in distress,thin  Respiratory system: Bilateral decreased air entry  cardiovascular system: S1 & S2 heard, RRR. No JVD, murmurs, rubs, gallops or clicks. No pedal edema. Gastrointestinal system: Abdomen is nondistended, soft and nontender. No organomegaly or masses felt. Normal bowel sounds heard. Central nervous system: Alert and oriented. No focal neurological deficits. Extremities: No  edema, no clubbing ,no cyanosis, distal peripheral pulses palpable. Skin: No cyanosis,No pallor,No Rash,No Ulcer Psychiatry: Judgement and insight appear normal. Mood & affect appropriate.   Data Reviewed: I have personally reviewed following labs and imaging studies  CBC: Recent Labs  Lab 01/01/18 1052 01/05/18 0633  WBC 5.0  7.7  NEUTROABS 2.9 5.9  HGB 15.0 13.9  HCT 44.7 42.1  MCV 87.1 88.3  PLT 181 676   Basic Metabolic Panel: Recent Labs  Lab 01/01/18 1052 01/01/18 1635 01/01/18 2210 01/02/18 0850 01/03/18 0618 01/05/18 0633  NA 138  --   --  140 139 137  K 2.4*  --   --  2.5* 4.4 3.4*  CL 95*  --   --  106 105 98*  CO2 31  --   --  23 27 28   GLUCOSE 136*  --   --  217* 133* 109*  BUN 14  --   --  9 10 19   CREATININE 1.20* 1.00  --  0.88 0.75 0.60  CALCIUM 8.5*  --   --  8.5* 8.3* 8.8*  MG  --   --  1.7 1.5* 2.1  --    GFR: Estimated Creatinine Clearance: 58 mL/min (by C-G formula based on SCr of 0.6 mg/dL). Liver Function Tests: Recent Labs  Lab 01/01/18 1052  AST 29  ALT 16  ALKPHOS 58  BILITOT 1.2  PROT 7.0  ALBUMIN 3.7   No results for input(s): LIPASE, AMYLASE in the last 168 hours. No results for input(s): AMMONIA in the last 168 hours. Coagulation Profile: No results for input(s): INR, PROTIME in the last 168 hours. Cardiac Enzymes: Recent Labs  Lab 01/01/18 1635 01/01/18 2210 01/02/18 0420  TROPONINI <0.03 <0.03 <0.03   BNP (last 3 results) No results for input(s): PROBNP in the last 8760 hours. HbA1C: No results for input(s): HGBA1C in the last 72 hours. CBG: No results for input(s): GLUCAP in the last 168 hours. Lipid Profile: No results for input(s): CHOL, HDL, LDLCALC, TRIG, CHOLHDL, LDLDIRECT in the last 72 hours. Thyroid Function Tests: No results for input(s): TSH, T4TOTAL, FREET4, T3FREE, THYROIDAB in the last 72 hours. Anemia Panel: No results for input(s): VITAMINB12, FOLATE, FERRITIN, TIBC, IRON, RETICCTPCT in the last 72 hours. Sepsis Labs: Recent Labs  Lab 01/01/18 1110 01/01/18 1313  LATICACIDVEN 2.64* 1.25    Recent Results (from the past 240 hour(s))  Respiratory Panel by PCR     Status: Abnormal   Collection Time: 01/01/18  3:44 PM  Result Value Ref Range Status   Adenovirus NOT DETECTED NOT DETECTED Final   Coronavirus 229E NOT  DETECTED NOT DETECTED Final   Coronavirus HKU1 NOT DETECTED NOT DETECTED Final   Coronavirus NL63 NOT DETECTED NOT DETECTED Final   Coronavirus OC43 NOT DETECTED NOT DETECTED Final   Metapneumovirus NOT DETECTED NOT DETECTED Final   Rhinovirus / Enterovirus NOT DETECTED NOT DETECTED Final   Influenza A H1 2009 DETECTED (A) NOT DETECTED Final   Influenza B NOT DETECTED NOT DETECTED Final   Parainfluenza Virus 1 NOT DETECTED NOT DETECTED Final   Parainfluenza Virus 2 NOT DETECTED NOT DETECTED Final   Parainfluenza Virus 3 NOT DETECTED NOT DETECTED Final   Parainfluenza Virus 4 NOT DETECTED NOT DETECTED Final   Respiratory Syncytial Virus NOT DETECTED NOT DETECTED Final   Bordetella pertussis NOT DETECTED NOT DETECTED Final   Chlamydophila pneumoniae NOT DETECTED NOT DETECTED Final   Mycoplasma pneumoniae NOT DETECTED NOT DETECTED Final  MRSA PCR  Screening     Status: None   Collection Time: 01/01/18 11:37 PM  Result Value Ref Range Status   MRSA by PCR NEGATIVE NEGATIVE Final    Comment:        The GeneXpert MRSA Assay (FDA approved for NASAL specimens only), is one component of a comprehensive MRSA colonization surveillance program. It is not intended to diagnose MRSA infection nor to guide or monitor treatment for MRSA infections.   Culture, Urine     Status: Abnormal (Preliminary result)   Collection Time: 01/04/18  3:29 PM  Result Value Ref Range Status   Specimen Description URINE, CLEAN CATCH  Final   Special Requests NONE  Final   Culture (A)  Final    >=100,000 COLONIES/mL ESCHERICHIA COLI SUSCEPTIBILITIES TO FOLLOW    Report Status PENDING  Incomplete         Radiology Studies: No results found.      Scheduled Meds: . amLODipine  5 mg Oral Daily  . aspirin EC  81 mg Oral QHS  . cephALEXin  500 mg Oral Q12H  . DULoxetine  90 mg Oral QHS  . enoxaparin (LOVENOX) injection  40 mg Subcutaneous Q24H  . feeding supplement (ENSURE ENLIVE)  237 mL Oral BID BM   . guaiFENesin  600 mg Oral BID  . isosorbide mononitrate  30 mg Oral QPM  . isosorbide mononitrate  60 mg Oral Daily  . oseltamivir  75 mg Oral BID  . pantoprazole  40 mg Oral Daily  . [START ON 01/06/2018] predniSONE  40 mg Oral Q breakfast  . traZODone  150 mg Oral QHS   Continuous Infusions:   LOS: 4 days    Time spent: 25 minutes    Yamaira Spinner Jodie Echevaria, MD [facility] Pager [phone]  If 7PM-7AM, please contact night-coverage [email or web address] Password [email or web address] 01/05/2018, 1:27 PM

## 2018-01-06 LAB — URINE CULTURE

## 2018-01-06 MED ORDER — POTASSIUM CHLORIDE CRYS ER 20 MEQ PO TBCR: 20.0000 meq | EXTENDED_RELEASE_TABLET | Freq: Every day | ORAL | 0 refills | Status: DC

## 2018-01-06 MED ORDER — CEPHALEXIN 500 MG PO CAPS: 500.0000 mg | ORAL_CAPSULE | Freq: Two times a day (BID) | ORAL | 0 refills | Status: DC

## 2018-01-06 MED ORDER — POTASSIUM CHLORIDE CRYS ER 20 MEQ PO TBCR
40.0000 meq | EXTENDED_RELEASE_TABLET | Freq: Once | ORAL | Status: AC
Start: 2018-01-06 — End: 2018-01-06
  Administered 2018-01-06: 40 meq via ORAL
  Filled 2018-01-06: qty 2

## 2018-01-06 MED ORDER — POLYETHYLENE GLYCOL 3350 17 G PO PACK
17.0000 g | PACK | Freq: Every day | ORAL | Status: DC
  Administered 2018-01-06: 17 g via ORAL
  Filled 2018-01-06: qty 1

## 2018-01-06 MED ORDER — BISACODYL 10 MG RE SUPP
10.0000 mg | Freq: Once | RECTAL | Status: AC
  Administered 2018-01-06: 10 mg via RECTAL
  Filled 2018-01-06: qty 1

## 2018-01-06 MED ORDER — OSELTAMIVIR PHOSPHATE 75 MG PO CAPS: 75.0000 mg | ORAL_CAPSULE | Freq: Two times a day (BID) | ORAL | 0 refills | Status: DC

## 2018-01-06 NOTE — Discharge Summary (Signed)
Physician Discharge Summary  Linda [name] NLZ:[id number] DOB: 07-13-1946 DOA: 01/01/2018  PCP: Tammi Sou, MD  Admit date: 01/01/2018 Discharge date: 01/06/2018  Admitted From: Home Disposition: Home   Home Health:Yes   Discharge Condition: Stable CODE STATUS:Full Diet recommendation: Heart Healthy Brief/Interim Summary:  Patient is a 72 year old female with past medical history of chronic respiratory insufficiency, history of asbestosis on home oxygen, chronic pain syndrome who presented with generalized weakness, hypotension and respiratory distress.  Patient was found to have flu positive and was currently being managed for that.  Following problems were addressed during hospitalization:  Influenza A: Chest x-ray did not show any pneumonia.  Patient remains afebrile.    Continue Tamiflu to complete the course.  Acute kidney injury/hypotension/dehydration: Resolved with IV fluids.  Hypokalemia/hypomagnesemia:  Supplemented and corrected.  Acute bronchitis: Completed course of  azithromycin.  History of asbestosis/asthma/chronic respiratory failure: On 3 L oxygen via nasal cannula at home .  Continue supplemental oxygen.  Failure to thrive: Reported unintentional weight loss of 45 pounds in last 12 months. Nutrition was  following.  Chronic diastolic CHF: Currently compensated.  Lasix on hold for AKI.Can be resumed on DC.  Generalized weakness: Evaluated by physical therapy and recommended skilled nursing facility on discharge.  Patient denied. She has active home health for PT.  HTN: Home medications resumed.  BP stable this morning  UTI: Patient complained of foul-smelling urine and it was also dark in color.  She had history of recurrent urinary tract infection .  UA done here showed bacteria. Urine culture grew E. Coli which was pansensitive.  Being treated with Keflex    Discharge Diagnoses:  Principal Problem:   Influenza A Active Problems:    Chronic diastolic CHF, NYHA class 1   Chronic pain syndrome   Anxiety and depression   Hypokalemia   [name] ([place])   Dehydration   Failure to thrive in adult   Chronic respiratory failure with hypoxia (HCC)   Hypomagnesemia   Acute bronchitis    Discharge Instructions  Discharge Instructions    Diet - low sodium heart healthy   Complete by:  As directed    Discharge instructions   Complete by:  As directed    1) Follow up with your PCP in a week. 2) Do CBC/BMP testing a week. 3) Take prescribed medications as instructed.   Increase activity slowly   Complete by:  As directed      Allergies as of 01/06/2018      Reactions   Abilify [aripiprazole] Other (See Comments)   parkinsonism   Penicillins Itching, Swelling, Rash   Tolerated Cefepime in ED. Has patient had a PCN reaction causing immediate rash, facial/tongue/throat swelling, SOB or lightheadedness with hypotension: Yes Has patient had a PCN reaction causing severe rash involving mucus membranes or skin necrosis: No Has patient had a PCN reaction that required hospitalization: No  Has patient had a PCN reaction occurring within the last 10 years: No      Medication List    STOP taking these medications   azithromycin 250 MG tablet Commonly known as:  ZITHROMAX     TAKE these medications   albuterol 108 (90 Base) MCG/ACT inhaler Commonly known as:  PROVENTIL HFA;VENTOLIN HFA Inhale 2 puffs into the lungs every 6 (six) hours as needed for wheezing or shortness of breath.   alendronate 70 MG tablet Commonly known as:  FOSAMAX Take 1 tablet (70 mg total) by mouth every 7 (seven) days. Take with  a full glass of water on an empty stomach, first thing in the morning and remain up right for 30 minutes after taking.   ALPRAZolam 1 MG tablet Commonly known as:  XANAX TAKE 1 TABLET THREE TIMES DAILY AS NEEDED FOR ANXIETY.   aspirin 81 MG tablet Take 81 mg by mouth at bedtime.   cephALEXin 500 MG  capsule Commonly known as:  KEFLEX Take 1 capsule (500 mg total) by mouth every 12 (twelve) hours.   diclofenac sodium 1 % Gel Commonly known as:  VOLTAREN Apply 2 g topically 3 (three) times daily as needed (pain).   DULoxetine 60 MG capsule Commonly known as:  CYMBALTA TAKE 1 CAPSULE A DAY TO BE COMBINED WITH 30MG  CAPSULE   DULoxetine 30 MG capsule Commonly known as:  CYMBALTA TAKE 1 CAPSULE ONCE DAILY ALONG WITH 60MG  CAPSULE FOR TOTAL 90MG  DAILY.   furosemide 40 MG tablet Commonly known as:  LASIX Take 1 tablet (40 mg total) by mouth daily. What changed:    when to take this  reasons to take this   ibuprofen 800 MG tablet Commonly known as:  ADVIL,MOTRIN Take 800 mg by mouth every 8 (eight) hours as needed for moderate pain.   isosorbide mononitrate 30 MG 24 hr tablet Commonly known as:  IMDUR Take 1 tablet (30 mg total) by mouth 2 (two) times daily. 60mg  in the am and 30mg  in the pm What changed:    how much to take  when to take this  additional instructions   lamotrigine 25 MG disintegrating tablet Commonly known as:  LAMICTAL 2 tabs po qd x 15d, then take 4 tabs po qd x 15d   MUCINEX PO Take 1 tablet by mouth as needed (Congestion and Mucus build up).   multivitamin with minerals Tabs tablet Take 1 tablet by mouth daily.   nitroGLYCERIN 0.4 MG SL tablet Commonly known as:  NITROSTAT Place 1 tablet (0.4 mg total) under the tongue every 5 (five) minutes as needed for chest pain (x 3 doses).   oseltamivir 75 MG capsule Commonly known as:  TAMIFLU Take 1 capsule (75 mg total) by mouth 2 (two) times daily.   oxyCODONE-acetaminophen 10-325 MG tablet Commonly known as:  PERCOCET Take 1 tablet by mouth every 6 (six) hours as needed for pain.   OXYGEN Inhale 3 L into the lungs continuous.   pantoprazole 40 MG tablet Commonly known as:  PROTONIX TAKE 1 TABLET DAILY.   polyethylene glycol packet Commonly known as:  MIRALAX / GLYCOLAX Take 17 g by  mouth daily as needed (constipation). Mix in 8 oz liquid and drink   potassium chloride SA 20 MEQ tablet Commonly known as:  K-DUR,KLOR-CON Take 1 tablet (20 mEq total) by mouth daily. What changed:    when to take this  additional instructions   predniSONE 20 MG tablet Commonly known as:  DELTASONE 2 tabs po qd x 5d   sodium chloride 0.65 % Soln nasal spray Commonly known as:  OCEAN Place 1-2 sprays into both nostrils as needed for congestion.   traZODone 50 MG tablet Commonly known as:  DESYREL TAKE 2-4 TABLETS AT BEDTIME What changed:  See the new instructions.   Vitamin D (Ergocalciferol) 50000 units Caps capsule Commonly known as:  DRISDOL 1 tab po TWICE per week      [facility] Follow up.   Why:  For resumption of services established prior to admission.  Contact  information: [place] [address] [phone]          Allergies  Allergen Reactions  . Abilify [Aripiprazole] Other (See Comments)    parkinsonism  . Penicillins Itching, Swelling and Rash    Tolerated Cefepime in ED. Has patient had a PCN reaction causing immediate rash, facial/tongue/throat swelling, SOB or lightheadedness with hypotension: Yes Has patient had a PCN reaction causing severe rash involving mucus membranes or skin necrosis: No Has patient had a PCN reaction that required hospitalization: No  Has patient had a PCN reaction occurring within the last 10 years: No     Consultations: None  Procedures/Studies: Dg Chest Port 1 View  Result Date: 01/01/2018 CLINICAL DATA:  Bronchitis EXAM: PORTABLE CHEST 1 VIEW COMPARISON:  10/21/2017 FINDINGS: Calcified pleural plaques bilaterally. Heart is normal size. No confluent airspace opacities or effusions. No acute bony abnormality. IMPRESSION: Bilateral calcified pleural plaques. No acute cardiopulmonary disease. Electronically Signed   By: Rolm Baptise M.D.   On:  01/01/2018 12:06      Subjective: Patient seen and examined the bedside this morning.  Remains comfortable.  No new issues/complaints.  Stable for discharge today.  Discharge Exam: Vitals:   01/06/18 0507 01/06/18 0852  BP: (!) 160/80 134/79  Pulse: 86 (!) 105  Resp: 16 16  Temp: 98.1 F (36.7 C) 97.6 F (36.4 C)  SpO2: 96% 100%   Vitals:   01/05/18 1615 01/05/18 2045 01/06/18 0507 01/06/18 0852  BP: (!) 160/78 (!) 177/91 (!) 160/80 134/79  Pulse: 100 92 86 (!) 105  Resp: 18 16 16 16   Temp: 98.2 F (36.8 C) 98 F (36.7 C) 98.1 F (36.7 C) 97.6 F (36.4 C)  TempSrc: Oral   Oral  SpO2: 98% 95% 96% 100%  Weight:  62.6 kg (138 lb)    Height:        General: Pt is alert, awake, not in acute distress Cardiovascular: RRR, S1/S2 +, no rubs, no gallops Respiratory: CTA bilaterally, no wheezing, no rhonchi Abdominal: Soft, NT, ND, bowel sounds  Extremities: no edema, no cyanosis    The results of significant diagnostics from this hospitalization (including imaging, microbiology, ancillary and laboratory) are listed below for reference.     Microbiology: Recent Results (from the past 240 hour(s))  Respiratory Panel by PCR     Status: Abnormal   Collection Time: 01/01/18  3:44 PM  Result Value Ref Range Status   Adenovirus NOT DETECTED NOT DETECTED Final   Coronavirus 229E NOT DETECTED NOT DETECTED Final   Coronavirus HKU1 NOT DETECTED NOT DETECTED Final   Coronavirus NL63 NOT DETECTED NOT DETECTED Final   Coronavirus OC43 NOT DETECTED NOT DETECTED Final   Metapneumovirus NOT DETECTED NOT DETECTED Final   Rhinovirus / Enterovirus NOT DETECTED NOT DETECTED Final   Influenza A H1 2009 DETECTED (A) NOT DETECTED Final   Influenza B NOT DETECTED NOT DETECTED Final   Parainfluenza Virus 1 NOT DETECTED NOT DETECTED Final   Parainfluenza Virus 2 NOT DETECTED NOT DETECTED Final   Parainfluenza Virus 3 NOT DETECTED NOT DETECTED Final   Parainfluenza Virus 4 NOT DETECTED NOT  DETECTED Final   Respiratory Syncytial Virus NOT DETECTED NOT DETECTED Final   Bordetella pertussis NOT DETECTED NOT DETECTED Final   Chlamydophila pneumoniae NOT DETECTED NOT DETECTED Final   Mycoplasma pneumoniae NOT DETECTED NOT DETECTED Final  MRSA PCR Screening     Status: None   Collection Time: 01/01/18 11:37 PM  Result Value Ref Range  Status   MRSA by PCR NEGATIVE NEGATIVE Final    Comment:        The GeneXpert MRSA Assay (FDA approved for NASAL specimens only), is one component of a comprehensive MRSA colonization surveillance program. It is not intended to diagnose MRSA infection nor to guide or monitor treatment for MRSA infections.   Culture, Urine     Status: Abnormal   Collection Time: 01/04/18  3:29 PM  Result Value Ref Range Status   Specimen Description URINE, CLEAN CATCH  Final   Special Requests NONE  Final   Culture >=100,000 COLONIES/mL ESCHERICHIA COLI (A)  Final   Report Status 01/06/2018 FINAL  Final   Organism ID, Bacteria ESCHERICHIA COLI (A)  Final      Susceptibility   Escherichia coli - MIC*    AMPICILLIN <=2 SENSITIVE Sensitive     CEFAZOLIN <=4 SENSITIVE Sensitive     CEFTRIAXONE <=1 SENSITIVE Sensitive     CIPROFLOXACIN <=0.25 SENSITIVE Sensitive     GENTAMICIN <=1 SENSITIVE Sensitive     IMIPENEM <=0.25 SENSITIVE Sensitive     NITROFURANTOIN <=16 SENSITIVE Sensitive     TRIMETH/SULFA <=20 SENSITIVE Sensitive     AMPICILLIN/SULBACTAM <=2 SENSITIVE Sensitive     PIP/TAZO <=4 SENSITIVE Sensitive     Extended ESBL NEGATIVE Sensitive     * >=100,000 COLONIES/mL ESCHERICHIA COLI     Labs: BNP (last 3 results) Recent Labs    10/21/17 0058  BNP 12.4   Basic Metabolic Panel: Recent Labs  Lab 01/01/18 1052 01/01/18 1635 01/01/18 2210 01/02/18 0850 01/03/18 0618 01/05/18 0633  NA 138  --   --  140 139 137  K 2.4*  --   --  2.5* 4.4 3.4*  CL 95*  --   --  106 105 98*  CO2 31  --   --  23 27 28   GLUCOSE 136*  --   --  217* 133* 109*   BUN 14  --   --  9 10 19   CREATININE 1.20* 1.00  --  0.88 0.75 0.60  CALCIUM 8.5*  --   --  8.5* 8.3* 8.8*  MG  --   --  1.7 1.5* 2.1  --    Liver Function Tests: Recent Labs  Lab 01/01/18 1052  AST 29  ALT 16  ALKPHOS 58  BILITOT 1.2  PROT 7.0  ALBUMIN 3.7   No results for input(s): LIPASE, AMYLASE in the last 168 hours. No results for input(s): AMMONIA in the last 168 hours. CBC: Recent Labs  Lab 01/01/18 1052 01/05/18 0633  WBC 5.0 7.7  NEUTROABS 2.9 5.9  HGB 15.0 13.9  HCT 44.7 42.1  MCV 87.1 88.3  PLT 181 182   Cardiac Enzymes: Recent Labs  Lab 01/01/18 1635 01/01/18 2210 01/02/18 0420  TROPONINI <0.03 <0.03 <0.03   BNP: Invalid input(s): POCBNP CBG: No results for input(s): GLUCAP in the last 168 hours. D-Dimer No results for input(s): DDIMER in the last 72 hours. Hgb A1c No results for input(s): HGBA1C in the last 72 hours. Lipid Profile No results for input(s): CHOL, HDL, LDLCALC, TRIG, CHOLHDL, LDLDIRECT in the last 72 hours. Thyroid function studies No results for input(s): TSH, T4TOTAL, T3FREE, THYROIDAB in the last 72 hours.  Invalid input(s): FREET3 Anemia work up No results for input(s): VITAMINB12, FOLATE, FERRITIN, TIBC, IRON, RETICCTPCT in the last 72 hours. Urinalysis    Component Value Date/Time   COLORURINE YELLOW 01/03/2018 [place] 01/03/2018 1415  LABSPEC 1.009 01/03/2018 1415   PHURINE 7.0 01/03/2018 1415   GLUCOSEU NEGATIVE 01/03/2018 1415   HGBUR MODERATE (A) 01/03/2018 1415   BILIRUBINUR NEGATIVE 01/03/2018 1415   BILIRUBINUR small 10/01/2016 1253   KETONESUR NEGATIVE 01/03/2018 1415   PROTEINUR NEGATIVE 01/03/2018 1415   UROBILINOGEN 1.0 10/01/2016 1253   UROBILINOGEN 1.0 10/16/2015 2009   NITRITE NEGATIVE 01/03/2018 1415   LEUKOCYTESUR SMALL (A) 01/03/2018 1415   Sepsis Labs Invalid input(s): PROCALCITONIN,  WBC,  LACTICIDVEN Microbiology Recent Results (from the past 240 hour(s))  Respiratory  Panel by PCR     Status: Abnormal   Collection Time: 01/01/18  3:44 PM  Result Value Ref Range Status   Adenovirus NOT DETECTED NOT DETECTED Final   Coronavirus 229E NOT DETECTED NOT DETECTED Final   Coronavirus HKU1 NOT DETECTED NOT DETECTED Final   Coronavirus NL63 NOT DETECTED NOT DETECTED Final   Coronavirus OC43 NOT DETECTED NOT DETECTED Final   Metapneumovirus NOT DETECTED NOT DETECTED Final   Rhinovirus / Enterovirus NOT DETECTED NOT DETECTED Final   Influenza A H1 2009 DETECTED (A) NOT DETECTED Final   Influenza B NOT DETECTED NOT DETECTED Final   Parainfluenza Virus 1 NOT DETECTED NOT DETECTED Final   Parainfluenza Virus 2 NOT DETECTED NOT DETECTED Final   Parainfluenza Virus 3 NOT DETECTED NOT DETECTED Final   Parainfluenza Virus 4 NOT DETECTED NOT DETECTED Final   Respiratory Syncytial Virus NOT DETECTED NOT DETECTED Final   Bordetella pertussis NOT DETECTED NOT DETECTED Final   Chlamydophila pneumoniae NOT DETECTED NOT DETECTED Final   Mycoplasma pneumoniae NOT DETECTED NOT DETECTED Final  MRSA PCR Screening     Status: None   Collection Time: 01/01/18 11:37 PM  Result Value Ref Range Status   MRSA by PCR NEGATIVE NEGATIVE Final    Comment:        The GeneXpert MRSA Assay (FDA approved for NASAL specimens only), is one component of a comprehensive MRSA colonization surveillance program. It is not intended to diagnose MRSA infection nor to guide or monitor treatment for MRSA infections.   Culture, Urine     Status: Abnormal   Collection Time: 01/04/18  3:29 PM  Result Value Ref Range Status   Specimen Description URINE, CLEAN CATCH  Final   Special Requests NONE  Final   Culture >=100,000 COLONIES/mL ESCHERICHIA COLI (A)  Final   Report Status 01/06/2018 FINAL  Final   Organism ID, Bacteria ESCHERICHIA COLI (A)  Final      Susceptibility   Escherichia coli - MIC*    AMPICILLIN <=2 SENSITIVE Sensitive     CEFAZOLIN <=4 SENSITIVE Sensitive     CEFTRIAXONE <=1  SENSITIVE Sensitive     CIPROFLOXACIN <=0.25 SENSITIVE Sensitive     GENTAMICIN <=1 SENSITIVE Sensitive     IMIPENEM <=0.25 SENSITIVE Sensitive     NITROFURANTOIN <=16 SENSITIVE Sensitive     TRIMETH/SULFA <=20 SENSITIVE Sensitive     AMPICILLIN/SULBACTAM <=2 SENSITIVE Sensitive     PIP/TAZO <=4 SENSITIVE Sensitive     Extended ESBL NEGATIVE Sensitive     * >=100,000 COLONIES/mL ESCHERICHIA COLI     Time coordinating discharge: Over 30 minutes  SIGNED:   Marene Lenz, MD  [facility] 01/06/2018, 11:43 AM Pager [phone]  If 7PM-7AM, please contact night-coverage [email or web address] Password [email or web address]

## 2018-01-06 NOTE — Care Management Important Message (Signed)
Important Message  Patient Details  Name: Linda [name] MRN: [id number] Date of Birth: 1946/05/29   Medicare Important Message Given:  Yes    Carles Collet, RN 01/06/2018, 9:53 AM

## 2018-01-06 NOTE — Progress Notes (Signed)
Physical Therapy Treatment Patient Details Name: Linda [name] MRN: [id number] DOB: February 23, 1946 Today's Date: 01/06/2018    History of Present Illness Pt is a 72 y/o female admitted secondary to weakness and found to be hypokalemic. PMH including but not limited to CAD, COPD, diastolic HF and HTN.    PT Comments    Patient seen for activity progression, ambulated with patien ton room air. At rest saturations 94%, dropped to 90% on room air with activity improved to 95% with 2 liters [place].  OF NOTE: HR elevating to upper 130s with minimal activity.    Patient states that she does not have assist at home, continue to feel patient will need 24/7 supervision/assist. If unable to arrange then would still consider ST SNF although patient has declined this recommendation. Concerned regarding patient's ability to care for herself given current activity tolerance.   Follow Up Recommendations  Home health PT;Supervision/Assistance - 24 hour;Other (comment)     Equipment Recommendations  None recommended by PT    Recommendations for Other Services       Precautions / Restrictions Precautions Precautions: Fall Precaution Comments: Droplet Restrictions Weight Bearing Restrictions: No    Mobility  Bed Mobility Overal bed mobility: Modified Independent                Transfers Overall transfer level: Needs assistance Equipment used: None Transfers: Sit to/from Stand;Stand Pivot Transfers Sit to Stand: Supervision         General transfer comment: supervision for safety, no physical assist required  Ambulation/Gait Ambulation/Gait assistance: Supervision Ambulation Distance (Feet): 110 Feet Assistive device: Rolling walker (2 wheeled) Gait Pattern/deviations: Decreased stride length;Step-through pattern;Trunk flexed Gait velocity: decreased   General Gait Details: patient slow but steady with use of 4 wheeled rollator, continues to endorse modest dizziness and HR  elevated upper 130s with activity   Stairs            Wheelchair Mobility    Modified Rankin (Stroke Patients Only)       Balance Overall balance assessment: Needs assistance Sitting-balance support: Feet supported Sitting balance-Leahy Scale: Fair     Standing balance support: During functional activity;Single extremity supported Standing balance-Leahy Scale: Fair                              Cognition Arousal/Alertness: Awake/alert Behavior During Therapy: WFL for tasks assessed/performed Overall Cognitive Status: Within Functional Limits for tasks assessed                                        Exercises      General Comments        Pertinent Vitals/Pain Pain Assessment: No/denies pain    Home Living                      Prior Function            PT Goals (current goals can now be found in the care plan section) Acute Rehab PT Goals Patient Stated Goal: to feel better PT Goal Formulation: With patient Time For Goal Achievement: 01/16/18 Potential to Achieve Goals: Good Progress towards PT goals: Progressing toward goals    Frequency    Min 3X/week      PT Plan Discharge plan needs to be updated    Co-evaluation  AM-PAC PT "6 Clicks" Daily Activity  Outcome Measure  Difficulty turning over in bed (including adjusting bedclothes, sheets and blankets)?: None Difficulty moving from lying on back to sitting on the side of the bed? : None Difficulty sitting down on and standing up from a chair with arms (e.g., wheelchair, bedside commode, etc,.)?: A Little Help needed moving to and from a bed to chair (including a wheelchair)?: A Little Help needed walking in hospital room?: A Little Help needed climbing 3-5 steps with a railing? : A Lot 6 Click Score: 19    End of Session Equipment Utilized During Treatment: Oxygen Activity Tolerance: Patient limited by fatigue Patient left: in  bed;with call bell/phone within reach;with bed alarm set;Other (comment) Nurse Communication: Mobility status PT Visit Diagnosis: Other abnormalities of gait and mobility (R26.89);Muscle weakness (generalized) (M62.81)     Time: 6606-0045 PT Time Calculation (min) (ACUTE ONLY): 20 min  Charges:  $Gait Training: 8-22 mins                    G Codes:       Alben Deeds, PT DPT  Board Certified Neurologic Specialist [place] 01/06/2018, 2:50 PM

## 2018-01-06 NOTE — Care Management Note (Signed)
Case Management Note  Patient Details  Name: Linda [name] MRN: [id number] Date of Birth: 16-Nov-1946  Subjective/Objective:                 Spoke w patient at the bedside. She states she is active w [facility] for [facility] PT OT RN. Confirmed w Butch Penny, [facility]. CM placed resumption of care orders. Patient states she has O2 through [facility] at home, and has her portable device at bedside for transportation home today.    Action/Plan:  DC to home w [place].   Expected Discharge Date:                  Expected Discharge Plan:  [place]  In-House Referral:  Clinical Social Work  Discharge planning Services  CM Consult  Post Acute Care Choice:  Home Health Choice offered to:  Patient  DME Arranged:    DME Agency:     HH Arranged:  RN, PT, OT HH Agency:  [place]  Status of Service:  Completed, signed off  If discussed at [place] of Stay Meetings, dates discussed:    Additional Comments:  Carles Collet, RN 01/06/2018, 10:34 AM

## 2018-01-06 NOTE — Progress Notes (Signed)
Patient Discharge: Disposition: Patient discharged to home.  Education: Reviewed medications, prescriptions, discharge instructions, follow-up appointments, verbalized understanding. IV: Discontinued IV before discharge. Telemetry: N/A Transportation: Patient escorted out of the unit in w/c till the ride. Belongings: Patient took all her belongings with her.

## 2018-01-07 ENCOUNTER — Telehealth: Payer: Self-pay

## 2018-01-07 ENCOUNTER — Other Ambulatory Visit: Payer: Self-pay | Admitting: Family Medicine

## 2018-01-07 DIAGNOSIS — I2510 Atherosclerotic heart disease of native coronary artery without angina pectoris: Secondary | ICD-10-CM | POA: Diagnosis not present

## 2018-01-07 DIAGNOSIS — D510 Vitamin B12 deficiency anemia due to intrinsic factor deficiency: Secondary | ICD-10-CM | POA: Diagnosis not present

## 2018-01-07 DIAGNOSIS — Z7951 Long term (current) use of inhaled steroids: Secondary | ICD-10-CM | POA: Diagnosis not present

## 2018-01-07 DIAGNOSIS — J09X2 Influenza due to identified novel influenza A virus with other respiratory manifestations: Secondary | ICD-10-CM | POA: Diagnosis not present

## 2018-01-07 DIAGNOSIS — Z86711 Personal history of pulmonary embolism: Secondary | ICD-10-CM | POA: Diagnosis not present

## 2018-01-07 DIAGNOSIS — M797 Fibromyalgia: Secondary | ICD-10-CM | POA: Diagnosis not present

## 2018-01-07 DIAGNOSIS — G3183 Dementia with Lewy bodies: Secondary | ICD-10-CM | POA: Diagnosis not present

## 2018-01-07 DIAGNOSIS — Z9981 Dependence on supplemental oxygen: Secondary | ICD-10-CM | POA: Diagnosis not present

## 2018-01-07 DIAGNOSIS — E785 Hyperlipidemia, unspecified: Secondary | ICD-10-CM | POA: Diagnosis not present

## 2018-01-07 DIAGNOSIS — F0280 Dementia in other diseases classified elsewhere without behavioral disturbance: Secondary | ICD-10-CM | POA: Diagnosis not present

## 2018-01-07 DIAGNOSIS — B9620 Unspecified Escherichia coli [E. coli] as the cause of diseases classified elsewhere: Secondary | ICD-10-CM | POA: Diagnosis not present

## 2018-01-07 DIAGNOSIS — G4733 Obstructive sleep apnea (adult) (pediatric): Secondary | ICD-10-CM | POA: Diagnosis not present

## 2018-01-07 DIAGNOSIS — I110 Hypertensive heart disease with heart failure: Secondary | ICD-10-CM | POA: Diagnosis not present

## 2018-01-07 DIAGNOSIS — I5032 Chronic diastolic (congestive) heart failure: Secondary | ICD-10-CM | POA: Diagnosis not present

## 2018-01-07 DIAGNOSIS — R627 Adult failure to thrive: Secondary | ICD-10-CM | POA: Diagnosis not present

## 2018-01-07 DIAGNOSIS — F419 Anxiety disorder, unspecified: Secondary | ICD-10-CM | POA: Diagnosis not present

## 2018-01-07 DIAGNOSIS — Z7982 Long term (current) use of aspirin: Secondary | ICD-10-CM | POA: Diagnosis not present

## 2018-01-07 DIAGNOSIS — N390 Urinary tract infection, site not specified: Secondary | ICD-10-CM | POA: Diagnosis not present

## 2018-01-07 DIAGNOSIS — J61 Pneumoconiosis due to asbestos and other mineral fibers: Secondary | ICD-10-CM | POA: Diagnosis not present

## 2018-01-07 DIAGNOSIS — M1990 Unspecified osteoarthritis, unspecified site: Secondary | ICD-10-CM | POA: Diagnosis not present

## 2018-01-07 DIAGNOSIS — G894 Chronic pain syndrome: Secondary | ICD-10-CM | POA: Diagnosis not present

## 2018-01-07 DIAGNOSIS — F329 Major depressive disorder, single episode, unspecified: Secondary | ICD-10-CM | POA: Diagnosis not present

## 2018-01-07 NOTE — Telephone Encounter (Signed)
NOted.  TCM phone contact by nurse today.

## 2018-01-07 NOTE — Telephone Encounter (Signed)
Transition Care Management Follow-up Telephone Call   Date discharged? 01/06/2018   How have you been since you were released from the hospital? "I'm a little better"   Do you understand why you were in the hospital? yes, "Flu and Pneumonia"   Do you understand the discharge instructions? yes   Where were you discharged to? [facility]   Items Reviewed:  Medications reviewed: no  Allergies reviewed: yes  Dietary changes reviewed: yes  Referrals reviewed: PT working with patient several times a week, per patient.    Functional Questionnaire:   Activities of Daily Living (ADLs):   She states they are independent in the following: ambulation, bathing and hygiene, feeding, continence, grooming, toileting and dressing States they require assistance with the following: None.    Any transportation issues/concerns?: no, plans to have family drive to appt.    Any patient concerns? no   Confirmed importance and date/time of follow-up visits scheduled yes  Provider Appointment booked with PCP on 01/13/18 @ 1130.   Confirmed with patient if condition begins to worsen call PCP or go to the ER.  Patient was given the office number and encouraged to call back with question or concerns.  : yes

## 2018-01-09 ENCOUNTER — Telehealth: Payer: Self-pay | Admitting: Family Medicine

## 2018-01-09 DIAGNOSIS — J09X2 Influenza due to identified novel influenza A virus with other respiratory manifestations: Secondary | ICD-10-CM | POA: Diagnosis not present

## 2018-01-09 DIAGNOSIS — J61 Pneumoconiosis due to asbestos and other mineral fibers: Secondary | ICD-10-CM | POA: Diagnosis not present

## 2018-01-09 DIAGNOSIS — N390 Urinary tract infection, site not specified: Secondary | ICD-10-CM | POA: Diagnosis not present

## 2018-01-09 DIAGNOSIS — I5032 Chronic diastolic (congestive) heart failure: Secondary | ICD-10-CM | POA: Diagnosis not present

## 2018-01-09 DIAGNOSIS — I110 Hypertensive heart disease with heart failure: Secondary | ICD-10-CM | POA: Diagnosis not present

## 2018-01-09 DIAGNOSIS — I2510 Atherosclerotic heart disease of native coronary artery without angina pectoris: Secondary | ICD-10-CM | POA: Diagnosis not present

## 2018-01-09 NOTE — Telephone Encounter (Signed)
Verbal OK to start home health care. I can do her labs when I see her in the office next week.  No need for HH to do them.-thx

## 2018-01-09 NOTE — Telephone Encounter (Signed)
Caller name: Claiborne Billings  Relation to pt: PT  Call back number: [phone]    Reason for call:  Requesting verbal orders for PT 1x to 2x for 4 weeks.

## 2018-01-09 NOTE — Telephone Encounter (Signed)
Copied from [place] [id number]. Topic: Quick Communication - See Telephone Encounter >> Jan 09, 2018  8:52 AM Ahmed Prima L wrote: CRM for notification. See Telephone encounter for:   01/09/18.   Lori from [facility] needs to verify the start of care orders for nursing home health. She wants to know if she needs to re check her blood work next week. Please call back @ [phone]

## 2018-01-09 NOTE — Telephone Encounter (Signed)
Please advise. Thanks.  

## 2018-01-09 NOTE — Telephone Encounter (Signed)
Linda [name] advised and voiced understanding.   SW [name] in regards to message from [place], [place]. He gave verbal okay for PT as requested. Left detailed message on Kelly's vm.

## 2018-01-12 DIAGNOSIS — J09X2 Influenza due to identified novel influenza A virus with other respiratory manifestations: Secondary | ICD-10-CM | POA: Diagnosis not present

## 2018-01-12 DIAGNOSIS — I110 Hypertensive heart disease with heart failure: Secondary | ICD-10-CM | POA: Diagnosis not present

## 2018-01-12 DIAGNOSIS — I2510 Atherosclerotic heart disease of native coronary artery without angina pectoris: Secondary | ICD-10-CM | POA: Diagnosis not present

## 2018-01-12 DIAGNOSIS — N390 Urinary tract infection, site not specified: Secondary | ICD-10-CM | POA: Diagnosis not present

## 2018-01-12 DIAGNOSIS — I5032 Chronic diastolic (congestive) heart failure: Secondary | ICD-10-CM | POA: Diagnosis not present

## 2018-01-12 DIAGNOSIS — J61 Pneumoconiosis due to asbestos and other mineral fibers: Secondary | ICD-10-CM | POA: Diagnosis not present

## 2018-01-13 ENCOUNTER — Inpatient Hospital Stay: Payer: Medicare Other | Admitting: Family Medicine

## 2018-01-13 NOTE — Progress Notes (Deleted)
01/13/2018  CC: No chief complaint on file.   Patient is a 72 y.o. Caucasian female who presents for  hospital follow up, specifically Transitional Care Services face-to-face visit. Dates hospitalized: 1/10-1/15, 2019. Days since d/c from hospital: 7 days Patient was discharged from hospital to home, with [facility] PT. Reason for admission to hospital: acute on chronic resp distress, found to have + influenza test. Tamiflu and oxygen supplementation given.  Pt was also found to have a pansensitive e coli UTI during hospitalization so she was put on keflex. Date of interactive (phone) contact with patient and/or caregiver: 01/07/18.  She did have AKI during admission but this resolved and she was restarted on her lasix at d/c. Pt had very mild hypokalemia on 01/05/17 (3.4).    I have reviewed patient's discharge summary plus pertinent specific notes, labs, and imaging from the hospitalization.    {current status of patient, symptoms, etc}  Medication reconciliation was done today and patient {is not} {is} taking meds as recommended by discharging hospitalist/specialist.    PMH:  Past Medical History:  Diagnosis Date  . Acute upper GI bleed 06/2014   while pt taking coumadin, plavix, and meloxicam---despite being told not to take coumadin.  . Anginal pain ([place])    Nonobstructive CAD 2014; however, her cardiologist put her on a statin for this and NOT for hyperlipidemia per pt report.  Atyp CP 08/2017 at card f/u, plan for myoc perf imaging.  . Anxiety    panic attacks  . Asthma    w/ asbestososis   . BPPV (benign paroxysmal positional vertigo) 12/16/2012  . Chronic diastolic CHF (congestive heart failure) (HCC)    dry wt as of 11/06/16 is 168 lbs.  . Chronic lower back pain   . COPD (chronic obstructive pulmonary disease) ([place])   . DDD (degenerative disc disease)    lumbar and cervical.   . Diverticular disease   . Fibromyalgia    Patient states dx was around her late 56s but she had sx's  for years prior to this.  . H/O hiatal hernia   . Hay fever   . History of pneumonia    hospitalized 12/2011, 02/2013, and 07/2013 [facility]) for this  . HTN (hypertension)   . Idiopathic angio-edema-urticaria 72014   Angioedema component was very minimal  . Insomnia   . Iron deficiency anemia    Hematologist in [place], [place] did extensive w/u; no cause found; failed oral supplement;; gets fairly regular (q1m or so) IV iron infusions (Venofer -iron sucrose- 200mg  with procrit.  "for 14 yr I've been getting blood work q month & getting infusions prn" (07/12/2013).  Dr. Marin Olp locally, iron infusions done, EPO deficiency dx'd  . Migraine syndrome    "not as often anymore; used to be ~ q wk" (07/12/2013)  . Mixed incontinence urge and stress   . Nephrolithiasis    "passed all on my own or they are still in there" (07/12/2013)  . OSA on CPAP    prior to move to [place]--had another sleep study 10/2015 w/pulm Dr. Camillo Flaming.  . Osteoarthritis    "severe; progressing fast" (07/12/2013); multiple joints-not surgical candidate for TKR (03/2015)  . Parkinsonism [facility]) 2018   Dr. Carles Collet, neuro, saw her 11/24/17 and recommended d/c of abilify as first step.   [name] Kitchen Pernicious anemia 08/24/2014  . Pleural plaque with presence of asbestos 07/22/2013  . Pulmonary embolism ([place]) 07/2013   Dx at [facility] with very small peripheral upper lobe pe 07/2013: pt took coumadin  for about 8-9 mo  . Pyelonephritis    "several times over the last 30 yr" (07/12/2013)  . RBBB (right bundle branch block)   . Recurrent major depression ([place])   . Recurrent UTI    hx of hospitalization for pyelonephritis; started abx prophylaxis 06/2015  . Syncope    Hypotensive; ED visit--Dr. Terrence Dupont did Cath--nonobstructive CAD, EF 55-60%.  In retrospect, suspect pt rx med misuse/polypharmacy  . Tension headache, chronic     PSH:  Past Surgical History:  Procedure Laterality Date  . APPENDECTOMY  1960  . AXILLARY SURGERY Left 1978   Multiple "lump" in armpit per  pt  . CARDIAC CATHETERIZATION  01/2013   nonobstructive CAD, EF 55-60%  . CARDIOVASCULAR STRESS TEST  02/22/15   Low risk myocard perf imaging; wall motion normal, normal EF  . carotid duplex doppler  10/21/2017   R vertebral flow suggestive of possible distal obstruction.  Pt declined further w/u as of 10/29/17 but need to revisit this problem periodically.  . COCCYX REMOVAL  1972  . DEXA  06/05/2017   T-score -3.1  . DILATION AND CURETTAGE OF UTERUS  ? 1970's  . ESOPHAGOGASTRODUODENOSCOPY N/A 07/19/2014   Gastritis found + in the setting of supratherapeutic INR, +plavix, + meloxicam.  . EYE SURGERY Left 2012-2013   "injections for ~ 1 yr; don't really know what for" (07/12/2013)  . HEEL SPUR SURGERY Left 2008  . KNEE SURGERY  2005  . LEFT HEART CATHETERIZATION WITH CORONARY ANGIOGRAM N/A 01/30/2013   Procedure: LEFT HEART CATHETERIZATION WITH CORONARY ANGIOGRAM;  Surgeon: Clent Demark, MD;  Location: [facility] CATH LAB;  Service: Cardiovascular;  Laterality: N/A;  . PLANTAR FASCIA RELEASE Left 2008  . SPIROMETRY  04/25/14   In hosp for acute asthma/COPD flare: mixed obstructive and restrictive lung disease. The FEV1 is severely reduced at 45% predicted.  FEV1 signif decreased compared to prior spirometry 07/23/13.  . TENDON RELEASE  1996   Right forearm and hand  . TOTAL ABDOMINAL HYSTERECTOMY  1974  . TRANSTHORACIC ECHOCARDIOGRAM  01/2013; 04/2014;08/2015; 09/2017   2014--NORMAL.  2015--focal basal septal hypertrophy, EF 55-60%, grade I diast dysfxn, mild LAE.  08/2015 EF 55-60%, nl LV syst fxn, grade I DD, valves wnl. 10/21/17: EF 65-70%, grd I DD, o/w normal.    MEDS:  Outpatient Medications Prior to Visit  Medication Sig Dispense Refill  . albuterol (PROVENTIL HFA;VENTOLIN HFA) 108 (90 BASE) MCG/ACT inhaler Inhale 2 puffs into the lungs every 6 (six) hours as needed for wheezing or shortness of breath. 1 Inhaler 1  . alendronate (FOSAMAX) 70 MG tablet Take 1 tablet (70 mg total) by mouth every 7  (seven) days. Take with a full glass of water on an empty stomach, first thing in the morning and remain up right for 30 minutes after taking. 12 tablet 3  . ALPRAZolam (XANAX) 1 MG tablet TAKE 1 TABLET THREE TIMES DAILY AS NEEDED FOR ANXIETY. 90 tablet 5  . aspirin 81 MG tablet Take 81 mg by mouth at bedtime.    . cephALEXin (KEFLEX) 500 MG capsule Take 1 capsule (500 mg total) by mouth every 12 (twelve) hours. 7 capsule 0  . diclofenac sodium (VOLTAREN) 1 % GEL Apply 2 g topically 3 (three) times daily as needed (pain). 2 Tube 2  . DULoxetine (CYMBALTA) 30 MG capsule TAKE 1 CAPSULE ONCE DAILY ALONG WITH 60MG  CAPSULE FOR TOTAL 90MG  DAILY. 30 capsule 5  . DULoxetine (CYMBALTA) 60 MG capsule TAKE 1 CAPSULE A  DAY TO BE COMBINED WITH 30MG  CAPSULE 90 capsule 1  . furosemide (LASIX) 40 MG tablet Take 1 tablet (40 mg total) by mouth daily. (Patient taking differently: Take 40 mg by mouth as needed for fluid. ) 60 tablet 11  . GuaiFENesin (MUCINEX PO) Take 1 tablet by mouth as needed (Congestion and Mucus build up).    [name] Kitchen ibuprofen (ADVIL,MOTRIN) 800 MG tablet Take 800 mg by mouth every 8 (eight) hours as needed for moderate pain.    . isosorbide mononitrate (IMDUR) 30 MG 24 hr tablet Take 1 tablet (30 mg total) by mouth 2 (two) times daily. 60mg  in the am and 30mg  in the pm (Patient taking differently: Take 30-60 mg by mouth See admin instructions. 60mg  in the am and 30mg  in the pm) 90 tablet 3  . lamotrigine (LAMICTAL) 25 MG disintegrating tablet 2 tabs po qd x 15d, then take 4 tabs po qd x 15d 90 tablet 0  . Multiple Vitamin (MULTIVITAMIN WITH MINERALS) TABS tablet Take 1 tablet by mouth daily.    . nitroGLYCERIN (NITROSTAT) 0.4 MG SL tablet Place 1 tablet (0.4 mg total) under the tongue every 5 (five) minutes as needed for chest pain (x 3 doses). 25 tablet 3  . oseltamivir (TAMIFLU) 75 MG capsule Take 1 capsule (75 mg total) by mouth 2 (two) times daily. 3 capsule 0  . oxyCODONE-acetaminophen (PERCOCET)  10-325 MG tablet Take 1 tablet by mouth every 6 (six) hours as needed for pain. 100 tablet 0  . OXYGEN Inhale 3 L into the lungs continuous.    . pantoprazole (PROTONIX) 40 MG tablet TAKE 1 TABLET DAILY. 90 tablet 3  . polyethylene glycol (MIRALAX / GLYCOLAX) packet Take 17 g by mouth daily as needed (constipation). Mix in 8 oz liquid and drink    . potassium chloride SA (K-DUR,KLOR-CON) 20 MEQ tablet Take 1 tablet (20 mEq total) by mouth daily. 7 tablet 0  . predniSONE (DELTASONE) 20 MG tablet 2 tabs po qd x 5d (Patient not taking: Reported on 01/01/2018) 10 tablet 0  . sodium chloride (OCEAN) 0.65 % SOLN nasal spray Place 1-2 sprays into both nostrils as needed for congestion.    . traZODone (DESYREL) 50 MG tablet TAKE 2-4 TABLETS AT BEDTIME (Patient taking differently: TAKE 2-4 TABLETS AT BEDTIME FOR SLEEP) 120 tablet 6  . Vitamin D, Ergocalciferol, (DRISDOL) 50000 units CAPS capsule 1 tab po TWICE per week 24 capsule 3   Facility-Administered Medications Prior to Visit  Medication Dose Route Frequency Provider Last Rate Last Dose  . ferumoxytol (FERAHEME) 510 mg in sodium chloride 0.9 % 100 mL IVPB  510 mg Intravenous Once Volanda Napoleon, MD       EXAM: ***  Pertinent labs/imaging Lab Results  Component Value Date   TSH 0.745 10/21/2017   Lab Results  Component Value Date   WBC 7.7 01/05/2018   HGB 13.9 01/05/2018   HCT 42.1 01/05/2018   MCV 88.3 01/05/2018   PLT 182 01/05/2018   Lab Results  Component Value Date   CREATININE 0.60 01/05/2018   BUN 19 01/05/2018   NA 137 01/05/2018   K 3.4 (L) 01/05/2018   CL 98 (L) 01/05/2018   CO2 28 01/05/2018   Lab Results  Component Value Date   ALT 16 01/01/2018   AST 29 01/01/2018   ALKPHOS 58 01/01/2018   BILITOT 1.2 01/01/2018    ASSESSMENT/PLAN:  *** Hypokalemia (mild)--pt restarted lasix at d/c from hospital so I'll recheck BMET  today.  {Medical decision making of moderate complexity was utilized today}  99495  {Medical decision making of high complexity was utilized today} 99496  FOLLOW UP:  ***

## 2018-01-14 ENCOUNTER — Encounter: Payer: Self-pay | Admitting: Physical Medicine & Rehabilitation

## 2018-01-14 ENCOUNTER — Encounter: Payer: Medicare Other | Attending: Physical Medicine & Rehabilitation | Admitting: Physical Medicine & Rehabilitation

## 2018-01-14 ENCOUNTER — Other Ambulatory Visit: Payer: Self-pay

## 2018-01-14 DIAGNOSIS — Z79899 Other long term (current) drug therapy: Secondary | ICD-10-CM | POA: Diagnosis not present

## 2018-01-14 DIAGNOSIS — M797 Fibromyalgia: Secondary | ICD-10-CM | POA: Diagnosis not present

## 2018-01-14 DIAGNOSIS — M1712 Unilateral primary osteoarthritis, left knee: Secondary | ICD-10-CM | POA: Diagnosis not present

## 2018-01-14 DIAGNOSIS — M1711 Unilateral primary osteoarthritis, right knee: Secondary | ICD-10-CM | POA: Diagnosis not present

## 2018-01-14 DIAGNOSIS — R2689 Other abnormalities of gait and mobility: Secondary | ICD-10-CM | POA: Diagnosis not present

## 2018-01-14 DIAGNOSIS — Z5181 Encounter for therapeutic drug level monitoring: Secondary | ICD-10-CM | POA: Diagnosis not present

## 2018-01-14 MED ORDER — OXYCODONE-ACETAMINOPHEN 10-325 MG PO TABS: 1.0000 | ORAL_TABLET | Freq: Four times a day (QID) | ORAL | 0 refills | Status: DC | PRN

## 2018-01-14 NOTE — Progress Notes (Signed)
PROCEDURE NOTE  DIAGNOSIS: Bilateral OA knees  INTERVENTION:  BILATERAL ZILRETTA INJECTION     After informed consent and preparation of the skin with betadine and isopropyl alcohol, I injected 32MG  of zilretta diluted in 5cc diluent into the bilateral knees via anterolateral approach. Contents of syring were shaken vigorously and aspiration was performed prior to injection. The patient tolerated well, and no complications were encountered. Afterward the area was cleaned and dressed. Post- injection instructions were provided including ice to knees if swelling or pain should occur.   Additionally, I refilled her percocet today   Meredith Staggers, MD, [facility] 01/14/2018

## 2018-01-14 NOTE — Patient Instructions (Signed)
PLEASE FEEL FREE TO CALL OUR OFFICE WITH ANY PROBLEMS OR QUESTIONS ([phone])     APPLY ICE TO YOUR KNEES IF YOU EXPERIENCE PAIN OR SWELLING TODAY OR TOMORROW

## 2018-01-15 ENCOUNTER — Telehealth: Payer: Self-pay | Admitting: Family Medicine

## 2018-01-15 DIAGNOSIS — I2510 Atherosclerotic heart disease of native coronary artery without angina pectoris: Secondary | ICD-10-CM | POA: Diagnosis not present

## 2018-01-15 DIAGNOSIS — I5032 Chronic diastolic (congestive) heart failure: Secondary | ICD-10-CM | POA: Diagnosis not present

## 2018-01-15 DIAGNOSIS — N390 Urinary tract infection, site not specified: Secondary | ICD-10-CM | POA: Diagnosis not present

## 2018-01-15 DIAGNOSIS — J09X2 Influenza due to identified novel influenza A virus with other respiratory manifestations: Secondary | ICD-10-CM | POA: Diagnosis not present

## 2018-01-15 DIAGNOSIS — I110 Hypertensive heart disease with heart failure: Secondary | ICD-10-CM | POA: Diagnosis not present

## 2018-01-15 DIAGNOSIS — J61 Pneumoconiosis due to asbestos and other mineral fibers: Secondary | ICD-10-CM | POA: Diagnosis not present

## 2018-01-15 NOTE — Telephone Encounter (Signed)
Yes, this is fine.

## 2018-01-15 NOTE — Telephone Encounter (Signed)
Lori notified and verbal order given per [name].

## 2018-01-15 NOTE — Telephone Encounter (Signed)
Copied from [place] [id number]. Topic: Quick Communication - See Telephone Encounter >> Jan 15, 2018  2:13 PM Robina Ade, Helene Kelp D wrote: CRM for notification. See Telephone encounter for: 01/15/18. Kelly from [place] called to let provider [place] know about a missed session due to patient forgot. She will see her again next week for PT. If any questions she can be reached at [phone]

## 2018-01-15 NOTE — Telephone Encounter (Signed)
Copied from [place] [id number]. Topic: Quick Communication - See Telephone Encounter >> Jan 15, 2018 10:14 AM Percell Belt A wrote: CRM for notification. See Telephone encounter for: [facility] with [facility] -[phone] Verbal order for home health aid to get strength back   01/15/18.

## 2018-01-15 NOTE — Telephone Encounter (Signed)
Noted  

## 2018-01-19 ENCOUNTER — Encounter: Payer: Self-pay | Admitting: Family Medicine

## 2018-01-20 ENCOUNTER — Telehealth: Payer: Self-pay | Admitting: *Deleted

## 2018-01-20 DIAGNOSIS — I2510 Atherosclerotic heart disease of native coronary artery without angina pectoris: Secondary | ICD-10-CM | POA: Diagnosis not present

## 2018-01-20 DIAGNOSIS — J61 Pneumoconiosis due to asbestos and other mineral fibers: Secondary | ICD-10-CM | POA: Diagnosis not present

## 2018-01-20 DIAGNOSIS — J09X2 Influenza due to identified novel influenza A virus with other respiratory manifestations: Secondary | ICD-10-CM | POA: Diagnosis not present

## 2018-01-20 DIAGNOSIS — N390 Urinary tract infection, site not specified: Secondary | ICD-10-CM | POA: Diagnosis not present

## 2018-01-20 DIAGNOSIS — I5032 Chronic diastolic (congestive) heart failure: Secondary | ICD-10-CM | POA: Diagnosis not present

## 2018-01-20 DIAGNOSIS — I110 Hypertensive heart disease with heart failure: Secondary | ICD-10-CM | POA: Diagnosis not present

## 2018-01-20 NOTE — Telephone Encounter (Signed)
[facility] Nurse called stating that pt has been constipated since she was d/c from hospital. She has tried otc miralax once daily and suppositories, with no relief. [facility] Nurse would like to know if pt can increase dose of Miralax to twice daily. SW [name], okay to increase Miralax to twice daily. [facility] Nurse advised and voiced understanding. Pt has apt with [name] on 01/22/18.

## 2018-01-21 ENCOUNTER — Encounter: Payer: Self-pay | Admitting: *Deleted

## 2018-01-21 DIAGNOSIS — J09X2 Influenza due to identified novel influenza A virus with other respiratory manifestations: Secondary | ICD-10-CM | POA: Diagnosis not present

## 2018-01-21 DIAGNOSIS — M797 Fibromyalgia: Secondary | ICD-10-CM | POA: Insufficient documentation

## 2018-01-21 DIAGNOSIS — J449 Chronic obstructive pulmonary disease, unspecified: Secondary | ICD-10-CM | POA: Insufficient documentation

## 2018-01-21 DIAGNOSIS — I2510 Atherosclerotic heart disease of native coronary artery without angina pectoris: Secondary | ICD-10-CM | POA: Diagnosis not present

## 2018-01-21 DIAGNOSIS — I5032 Chronic diastolic (congestive) heart failure: Secondary | ICD-10-CM | POA: Diagnosis not present

## 2018-01-21 DIAGNOSIS — J45909 Unspecified asthma, uncomplicated: Secondary | ICD-10-CM

## 2018-01-21 DIAGNOSIS — N390 Urinary tract infection, site not specified: Secondary | ICD-10-CM | POA: Diagnosis not present

## 2018-01-21 DIAGNOSIS — I110 Hypertensive heart disease with heart failure: Secondary | ICD-10-CM | POA: Diagnosis not present

## 2018-01-21 DIAGNOSIS — J61 Pneumoconiosis due to asbestos and other mineral fibers: Secondary | ICD-10-CM | POA: Insufficient documentation

## 2018-01-22 ENCOUNTER — Ambulatory Visit (INDEPENDENT_AMBULATORY_CARE_PROVIDER_SITE_OTHER): Payer: Medicare Other | Admitting: Family Medicine

## 2018-01-22 ENCOUNTER — Encounter: Payer: Self-pay | Admitting: Family Medicine

## 2018-01-22 VITALS — BP 122/72 | HR 75 | Temp 97.9°F | Resp 16 | Ht 63.0 in | Wt 146.8 lb

## 2018-01-22 DIAGNOSIS — G3183 Dementia with Lewy bodies: Secondary | ICD-10-CM | POA: Diagnosis not present

## 2018-01-22 DIAGNOSIS — K5900 Constipation, unspecified: Secondary | ICD-10-CM

## 2018-01-22 DIAGNOSIS — N390 Urinary tract infection, site not specified: Secondary | ICD-10-CM | POA: Diagnosis not present

## 2018-01-22 DIAGNOSIS — I2510 Atherosclerotic heart disease of native coronary artery without angina pectoris: Secondary | ICD-10-CM | POA: Diagnosis not present

## 2018-01-22 DIAGNOSIS — J3489 Other specified disorders of nose and nasal sinuses: Secondary | ICD-10-CM

## 2018-01-22 DIAGNOSIS — D510 Vitamin B12 deficiency anemia due to intrinsic factor deficiency: Secondary | ICD-10-CM | POA: Diagnosis not present

## 2018-01-22 DIAGNOSIS — I110 Hypertensive heart disease with heart failure: Secondary | ICD-10-CM | POA: Diagnosis not present

## 2018-01-22 DIAGNOSIS — G2111 Neuroleptic induced parkinsonism: Secondary | ICD-10-CM

## 2018-01-22 DIAGNOSIS — J09X2 Influenza due to identified novel influenza A virus with other respiratory manifestations: Secondary | ICD-10-CM | POA: Diagnosis not present

## 2018-01-22 DIAGNOSIS — J61 Pneumoconiosis due to asbestos and other mineral fibers: Secondary | ICD-10-CM | POA: Diagnosis not present

## 2018-01-22 DIAGNOSIS — B9620 Unspecified Escherichia coli [E. coli] as the cause of diseases classified elsewhere: Secondary | ICD-10-CM | POA: Diagnosis not present

## 2018-01-22 DIAGNOSIS — J302 Other seasonal allergic rhinitis: Secondary | ICD-10-CM

## 2018-01-22 DIAGNOSIS — G894 Chronic pain syndrome: Secondary | ICD-10-CM | POA: Diagnosis not present

## 2018-01-22 DIAGNOSIS — I5032 Chronic diastolic (congestive) heart failure: Secondary | ICD-10-CM | POA: Diagnosis not present

## 2018-01-22 DIAGNOSIS — F0280 Dementia in other diseases classified elsewhere without behavioral disturbance: Secondary | ICD-10-CM | POA: Diagnosis not present

## 2018-01-22 DIAGNOSIS — R627 Adult failure to thrive: Secondary | ICD-10-CM | POA: Diagnosis not present

## 2018-01-22 DIAGNOSIS — F3341 Major depressive disorder, recurrent, in partial remission: Secondary | ICD-10-CM

## 2018-01-22 MED ORDER — FLUTICASONE PROPIONATE 50 MCG/ACT NA SUSP: 2.0000 | Freq: Every day | NASAL | 6 refills | Status: DC

## 2018-01-22 MED ORDER — IPRATROPIUM BROMIDE 0.03 % NA SOLN: 2.0000 | Freq: Two times a day (BID) | NASAL | 12 refills | Status: DC

## 2018-01-22 MED ORDER — LUBIPROSTONE 24 MCG PO CAPS: 24.0000 ug | ORAL_CAPSULE | Freq: Two times a day (BID) | ORAL | 6 refills | Status: DC

## 2018-01-22 MED ORDER — LAMOTRIGINE 100 MG PO TABS: 100.0000 mg | ORAL_TABLET | Freq: Every day | ORAL | 2 refills | Status: DC

## 2018-01-22 NOTE — Patient Instructions (Signed)
Take miralax TWICE every day. Start the new constipation med I prescribed for you called amitiza--as instructed on the bottle.  I sent in lamictal (lamotrigine) 100mg  tabs for you to take (1 tab per day).  I sent in flonase AND ipratropium nasal sprays--take as directed on packaging.

## 2018-01-22 NOTE — Progress Notes (Signed)
OFFICE VISIT  01/22/2018   CC:  Chief Complaint  Patient presents with  . Follow-up    Psych Med   HPI:    Patient is a 72 y.o. Caucasian female who presents for 1 mo f/u psychiatric meds. She has antipsychotic med induced parkinsonism and over the last 6-8 weeks we have been getting her titrated up on lamictal (after d/c of abilify). Last visit's instructions were to titrate to 50mg  qd x 2 wks, then increase to 100 mg qd x 2 weeks. She is improved on lamictal compared to abilify!.  No adverse side effects. No noticeable improvement in her parkinsonism at this time.  Since last visit: pt admitted 1/10-1/15, 2019 for influenza and dehydration.  She had AKI, improved clinically with IVF--Cr returned to normal prior to d/c.  Lasix to be restarted at d/c from hospital. No pneumonia found.  UTI dx'd in hospital (pansensitive e coli, pt was rx'd a course of keflex). She missed her [facility] hosp f/u visit here.  Says she is still having nasal congestion with PND, mild facial pain/pressure and HA, nausea due to the PND. No vomiting.  Clearing throat a lot.  No signif cough.  No fevers.  No ear pain.  Having frequent clear mucous run out nostrils esp when eating.  Constipated :  Last BM 2 wks ago!  Taking miralax qd and suppository x 2 total doses.  Just started taking miralax bid. She did get a lot of pain med in hosp recently for flu. No abd pain or bloating.  Takes oxycodone on daily basis.  ROS: no SOB, no vomiting, no dysuria.   Past Medical History:  Diagnosis Date  . Acute upper GI bleed 06/2014   while pt taking coumadin, plavix, and meloxicam---despite being told not to take coumadin.  . Anginal pain ([place])    Nonobstructive CAD 2014; however, her cardiologist put her on a statin for this and NOT for hyperlipidemia per pt report.  Atyp CP 08/2017 at card f/u, plan for myoc perf imaging.  . Anxiety    panic attacks  . Asthma    w/ asbestososis   . BPPV (benign paroxysmal positional  vertigo) 12/16/2012  . Chronic diastolic CHF (congestive heart failure) (HCC)    dry wt as of 11/06/16 is 168 lbs.  . Chronic lower back pain   . COPD (chronic obstructive pulmonary disease) ([place])   . DDD (degenerative disc disease)    lumbar and cervical.   . Diverticular disease   . Fibromyalgia    Patient states dx was around her late 57s but she had sx's for years prior to this.  . H/O hiatal hernia   . Hay fever   . History of pneumonia    hospitalized 12/2011, 02/2013, and 07/2013 [facility]) for this  . HTN (hypertension)   . Idiopathic angio-edema-urticaria 72014   Angioedema component was very minimal  . Insomnia   . Iron deficiency anemia    Hematologist in [place], [place] did extensive w/u; no cause found; failed oral supplement;; gets fairly regular (q56m or so) IV iron infusions (Venofer -iron sucrose- 200mg  with procrit.  "for 14 yr I've been getting blood work q month & getting infusions prn" (07/12/2013).  Dr. Marin Olp locally, iron infusions done, EPO deficiency dx'd  . Migraine syndrome    "not as often anymore; used to be ~ q wk" (07/12/2013)  . Mixed incontinence urge and stress   . Nephrolithiasis    "passed all on my own or they  are still in there" (07/12/2013)  . OSA on CPAP    prior to move to [place]--had another sleep study 10/2015 w/pulm Dr. Camillo Flaming.  . Osteoarthritis    "severe; progressing fast" (07/12/2013); multiple joints-not surgical candidate for TKR (03/2015).  Triamcinolon knee injections by Dr. Tessa Lerner 12/2017.  [name] Kitchen Parkinsonism [facility]) 2018   Dr. Carles Collet, neuro, saw her 11/24/17 and recommended d/c of abilify as first step.   [name] Kitchen Pernicious anemia 08/24/2014  . Pleural plaque with presence of asbestos 07/22/2013  . Pulmonary embolism ([place]) 07/2013   Dx at [facility] with very small peripheral upper lobe pe 07/2013: pt took coumadin for about 8-9 mo  . Pyelonephritis    "several times over the last 30 yr" (07/12/2013)  . RBBB (right bundle branch block)   . Recurrent major depression ([place])    . Recurrent UTI    hx of hospitalization for pyelonephritis; started abx prophylaxis 06/2015  . Syncope    Hypotensive; ED visit--Dr. Terrence Dupont did Cath--nonobstructive CAD, EF 55-60%.  In retrospect, suspect pt rx med misuse/polypharmacy  . Tension headache, chronic     Past Surgical History:  Procedure Laterality Date  . APPENDECTOMY  1960  . AXILLARY SURGERY Left 1978   Multiple "lump" in armpit per pt  . CARDIAC CATHETERIZATION  01/2013   nonobstructive CAD, EF 55-60%  . CARDIOVASCULAR STRESS TEST  02/22/15   Low risk myocard perf imaging; wall motion normal, normal EF  . carotid duplex doppler  10/21/2017   R vertebral flow suggestive of possible distal obstruction.  Pt declined further w/u as of 10/29/17 but need to revisit this problem periodically.  . COCCYX REMOVAL  1972  . DEXA  06/05/2017   T-score -3.1  . DILATION AND CURETTAGE OF UTERUS  ? 1970's  . ESOPHAGOGASTRODUODENOSCOPY N/A 07/19/2014   Gastritis found + in the setting of supratherapeutic INR, +plavix, + meloxicam.  . EYE SURGERY Left 2012-2013   "injections for ~ 1 yr; don't really know what for" (07/12/2013)  . HEEL SPUR SURGERY Left 2008  . KNEE SURGERY  2005  . LEFT HEART CATHETERIZATION WITH CORONARY ANGIOGRAM N/A 01/30/2013   Procedure: LEFT HEART CATHETERIZATION WITH CORONARY ANGIOGRAM;  Surgeon: Clent Demark, MD;  Location: [facility] C[facility] LAB;  Service: Cardiovascular;  Laterality: N/A;  . PLANTAR FASCIA RELEASE Left 2008  . SPIROMETRY  04/25/14   In hosp for acute asthma/COPD flare: mixed obstructive and restrictive lung disease. The FEV1 is severely reduced at 45% predicted.  FEV1 signif decreased compared to prior spirometry 07/23/13.  . TENDON RELEASE  1996   Right forearm and hand  . TOTAL ABDOMINAL HYSTERECTOMY  1974  . TRANSTHORACIC ECHOCARDIOGRAM  01/2013; 04/2014;08/2015; 09/2017   2014--NORMAL.  2015--focal basal septal hypertrophy, EF 55-60%, grade I diast dysfxn, mild LAE.  08/2015 EF 55-60%, nl LV syst fxn,  grade I DD, valves wnl. 10/21/17: EF 65-70%, grd I DD, o/w normal.    Outpatient Medications Prior to Visit  Medication Sig Dispense Refill  . albuterol (PROVENTIL HFA;VENTOLIN HFA) 108 (90 BASE) MCG/ACT inhaler Inhale 2 puffs into the lungs every 6 (six) hours as needed for wheezing or shortness of breath. 1 Inhaler 1  . alendronate (FOSAMAX) 70 MG tablet Take 1 tablet (70 mg total) by mouth every 7 (seven) days. Take with a full glass of water on an empty stomach, first thing in the morning and remain up right for 30 minutes after taking. 12 tablet 3  . ALPRAZolam (XANAX) 1 MG tablet TAKE 1  TABLET THREE TIMES DAILY AS NEEDED FOR ANXIETY. 90 tablet 5  . aspirin 81 MG tablet Take 81 mg by mouth at bedtime.    . diclofenac sodium (VOLTAREN) 1 % GEL Apply 2 g topically 3 (three) times daily as needed (pain). 2 Tube 2  . DULoxetine (CYMBALTA) 30 MG capsule TAKE 1 CAPSULE ONCE DAILY ALONG WITH 60MG  CAPSULE FOR TOTAL 90MG  DAILY. 30 capsule 5  . DULoxetine (CYMBALTA) 60 MG capsule TAKE 1 CAPSULE A DAY TO BE COMBINED WITH 30MG  CAPSULE 90 capsule 1  . furosemide (LASIX) 40 MG tablet Take 1 tablet (40 mg total) by mouth daily. (Patient taking differently: Take 40 mg by mouth as needed for fluid. ) 60 tablet 11  . isosorbide mononitrate (IMDUR) 30 MG 24 hr tablet Take 1 tablet (30 mg total) by mouth 2 (two) times daily. 60mg  in the am and 30mg  in the pm (Patient taking differently: Take 30-60 mg by mouth See admin instructions. 60mg  in the am and 30mg  in the pm) 90 tablet 3  . metoprolol succinate (TOPROL-XL) 25 MG 24 hr tablet Take 1 tablet by mouth daily.    . Multiple Vitamin (MULTIVITAMIN WITH MINERALS) TABS tablet Take 1 tablet by mouth daily.    . nitroGLYCERIN (NITROSTAT) 0.4 MG SL tablet Place 1 tablet (0.4 mg total) under the tongue every 5 (five) minutes as needed for chest pain (x 3 doses). 25 tablet 3  . oxyCODONE-acetaminophen (PERCOCET) 10-325 MG tablet Take 1 tablet by mouth every 6 (six) hours  as needed for pain. 100 tablet 0  . OXYGEN Inhale 3 L into the lungs continuous.    . pantoprazole (PROTONIX) 40 MG tablet TAKE 1 TABLET DAILY. 90 tablet 3  . polyethylene glycol (MIRALAX / GLYCOLAX) packet Take 17 g by mouth daily as needed (constipation). Mix in 8 oz liquid and drink    . traZODone (DESYREL) 50 MG tablet TAKE 2-4 TABLETS AT BEDTIME (Patient taking differently: TAKE 2-4 TABLETS AT BEDTIME FOR SLEEP) 120 tablet 6  . Vitamin D, Ergocalciferol, (DRISDOL) 50000 units CAPS capsule 1 tab po TWICE per week 24 capsule 3  . lamotrigine (LAMICTAL) 25 MG disintegrating tablet 2 tabs po qd x 15d, then take 4 tabs po qd x 15d 90 tablet 0  . cephALEXin (KEFLEX) 500 MG capsule Take 1 capsule (500 mg total) by mouth every 12 (twelve) hours. (Patient not taking: Reported on 01/22/2018) 7 capsule 0  . GuaiFENesin (MUCINEX PO) Take 1 tablet by mouth as needed (Congestion and Mucus build up).    [name] Kitchen ibuprofen (ADVIL,MOTRIN) 800 MG tablet Take 800 mg by mouth every 8 (eight) hours as needed for moderate pain.    [name] Kitchen oseltamivir (TAMIFLU) 75 MG capsule Take 1 capsule (75 mg total) by mouth 2 (two) times daily. (Patient not taking: Reported on 01/22/2018) 3 capsule 0  . potassium chloride SA (K-DUR,KLOR-CON) 20 MEQ tablet Take 1 tablet (20 mEq total) by mouth daily. (Patient not taking: Reported on 01/22/2018) 7 tablet 0  . predniSONE (DELTASONE) 20 MG tablet 2 tabs po qd x 5d (Patient not taking: Reported on 01/22/2018) 10 tablet 0  . sodium chloride (OCEAN) 0.65 % SOLN nasal spray Place 1-2 sprays into both nostrils as needed for congestion.     Facility-Administered Medications Prior to Visit  Medication Dose Route Frequency Provider Last Rate Last Dose  . ferumoxytol (FERAHEME) 510 mg in sodium chloride 0.9 % 100 mL IVPB  510 mg Intravenous Once Volanda Napoleon, MD  Allergies  Allergen Reactions  . Abilify [Aripiprazole] Other (See Comments)    parkinsonism  . Penicillins Itching, Swelling and  Rash    Tolerated Cefepime in ED. Has patient had a PCN reaction causing immediate rash, facial/tongue/throat swelling, SOB or lightheadedness with hypotension: Yes Has patient had a PCN reaction causing severe rash involving mucus membranes or skin necrosis: No Has patient had a PCN reaction that required hospitalization: No  Has patient had a PCN reaction occurring within the last 10 years: No     ROS As per HPI  PE: Blood pressure 122/72, pulse 75, temperature 97.9 F (36.6 C), temperature source Oral, resp. rate 16, height 5\' 3"  (1.6 m), weight 146 lb 12 oz (66.6 kg), SpO2 98 %. VS: noted--normal. Gen: alert, NAD, NONTOXIC APPEARING. HEENT: eyes without injection, drainage, or swelling.  Ears: EACs clear, TMs with normal light reflex and landmarks.  Nose: no rhinorrhea or signif nasal congestion or injection.  No purulent d/c.  No paranasal sinus TTP.  No facial swelling.  Throat and mouth without focal lesion.  No pharyngial swelling, erythema, or exudate.   Neck: supple, no LAD.   LUNGS: CTA bilat, nonlabored resps.   CV: RRR, no m/r/g.  ABD: soft, NT/ND EXT: no c/c/e SKIN: no rash    LABS:    Chemistry      Component Value Date/Time   NA 137 01/05/2018 0633   NA 141 06/05/2015 1315   K 3.4 (L) 01/05/2018 0633   K 3.9 06/05/2015 1315   CL 98 (L) 01/05/2018 0633   CL 103 06/05/2015 1315   CO2 28 01/05/2018 0633   CO2 28 06/05/2015 1315   BUN 19 01/05/2018 0633   BUN 14 06/05/2015 1315   CREATININE 0.60 01/05/2018 0633   CREATININE 0.87 02/03/2017 1523      Component Value Date/Time   CALCIUM 8.8 (L) 01/05/2018 0633   CALCIUM 9.0 06/05/2015 1315   ALKPHOS 58 01/01/2018 1052   ALKPHOS 84 06/05/2015 1315   AST 29 01/01/2018 1052   AST 23 06/05/2015 1315   ALT 16 01/01/2018 1052   ALT 17 06/05/2015 1315   BILITOT 1.2 01/01/2018 1052   BILITOT 1.00 06/05/2015 1315     Lab Results  Component Value Date   WBC 7.7 01/05/2018   HGB 13.9 01/05/2018   HCT 42.1  01/05/2018   MCV 88.3 01/05/2018   PLT 182 01/05/2018    IMPRESSION AND PLAN:  1) Antipsychotic med induced parkinsonism: no change in sx's since d/c abilify and switch to lamictal. She is doing well from a psych standpoint on lamictal.  Will continue 100 mg once daily. She has neurologist f/u set for June 2019.  2) Chronic nasal cong/PND and clear rhinorrhea. No sign of acute sinus infection. Start flonase---she says this has helped with nasal congestion and PND in the past but not runny nose. Start ipratropium nasal spray for chronic rhinorrhea (+gustatory rhinitis).  3) Constipation, acute on chronic: suspect this is largely opioid related. Increase miralax to bid scheduled AND start amitiza 24 mcg bid. Fortunately, she is not uncomfortable (no physical sx's) with the constipation at this time.  4) Influenza A, AKI, and UTI: recent hospitalization.  She is fortunately recovered completely from all of this.  An After Visit Summary was printed and given to the patient.  FOLLOW UP: Return in about 2 months (around 03/22/2018) for f/u psych/constip/nasal sx's (30 min).  Signed:  Crissie Sickles, MD  01/22/2018     

## 2018-01-23 DIAGNOSIS — I2510 Atherosclerotic heart disease of native coronary artery without angina pectoris: Secondary | ICD-10-CM | POA: Diagnosis not present

## 2018-01-23 DIAGNOSIS — I5032 Chronic diastolic (congestive) heart failure: Secondary | ICD-10-CM | POA: Diagnosis not present

## 2018-01-23 DIAGNOSIS — N390 Urinary tract infection, site not specified: Secondary | ICD-10-CM | POA: Diagnosis not present

## 2018-01-23 DIAGNOSIS — J09X2 Influenza due to identified novel influenza A virus with other respiratory manifestations: Secondary | ICD-10-CM | POA: Diagnosis not present

## 2018-01-23 DIAGNOSIS — J61 Pneumoconiosis due to asbestos and other mineral fibers: Secondary | ICD-10-CM | POA: Diagnosis not present

## 2018-01-23 DIAGNOSIS — I110 Hypertensive heart disease with heart failure: Secondary | ICD-10-CM | POA: Diagnosis not present

## 2018-01-25 ENCOUNTER — Other Ambulatory Visit: Payer: Self-pay | Admitting: Family Medicine

## 2018-01-26 ENCOUNTER — Telehealth: Payer: Self-pay | Admitting: Family Medicine

## 2018-01-26 DIAGNOSIS — J09X2 Influenza due to identified novel influenza A virus with other respiratory manifestations: Secondary | ICD-10-CM | POA: Diagnosis not present

## 2018-01-26 DIAGNOSIS — I5032 Chronic diastolic (congestive) heart failure: Secondary | ICD-10-CM | POA: Diagnosis not present

## 2018-01-26 DIAGNOSIS — I110 Hypertensive heart disease with heart failure: Secondary | ICD-10-CM | POA: Diagnosis not present

## 2018-01-26 DIAGNOSIS — N390 Urinary tract infection, site not specified: Secondary | ICD-10-CM | POA: Diagnosis not present

## 2018-01-26 DIAGNOSIS — J61 Pneumoconiosis due to asbestos and other mineral fibers: Secondary | ICD-10-CM | POA: Diagnosis not present

## 2018-01-26 DIAGNOSIS — I2510 Atherosclerotic heart disease of native coronary artery without angina pectoris: Secondary | ICD-10-CM | POA: Diagnosis not present

## 2018-01-26 NOTE — Telephone Encounter (Signed)
Please advise. Thanks.  

## 2018-01-26 NOTE — Telephone Encounter (Signed)
Left detailed message on Linda [name]'s vm.

## 2018-01-26 NOTE — Telephone Encounter (Signed)
Yes, verbal ok for this.-thx

## 2018-01-26 NOTE — Telephone Encounter (Signed)
Left detailed message of okay for OT.  Okay to leave message per phone note.

## 2018-01-26 NOTE — Telephone Encounter (Signed)
Copied from [place]. Topic: Quick Communication - See Telephone Encounter >> Jan 26, 2018  1:14 PM Arletha Grippe wrote: CRM for notification. See Telephone encounter for:   01/26/18. [place] [facility] calling for verbal orders Occupational therapy 1 week 4 Cb# [phone] Confidential voicemail, leave message

## 2018-01-27 DIAGNOSIS — J09X2 Influenza due to identified novel influenza A virus with other respiratory manifestations: Secondary | ICD-10-CM | POA: Diagnosis not present

## 2018-01-27 DIAGNOSIS — I2510 Atherosclerotic heart disease of native coronary artery without angina pectoris: Secondary | ICD-10-CM | POA: Diagnosis not present

## 2018-01-27 DIAGNOSIS — I5032 Chronic diastolic (congestive) heart failure: Secondary | ICD-10-CM | POA: Diagnosis not present

## 2018-01-27 DIAGNOSIS — I110 Hypertensive heart disease with heart failure: Secondary | ICD-10-CM | POA: Diagnosis not present

## 2018-01-27 DIAGNOSIS — N390 Urinary tract infection, site not specified: Secondary | ICD-10-CM | POA: Diagnosis not present

## 2018-01-27 DIAGNOSIS — J61 Pneumoconiosis due to asbestos and other mineral fibers: Secondary | ICD-10-CM | POA: Diagnosis not present

## 2018-01-30 DIAGNOSIS — I2510 Atherosclerotic heart disease of native coronary artery without angina pectoris: Secondary | ICD-10-CM | POA: Diagnosis not present

## 2018-01-30 DIAGNOSIS — I5032 Chronic diastolic (congestive) heart failure: Secondary | ICD-10-CM | POA: Diagnosis not present

## 2018-01-30 DIAGNOSIS — I110 Hypertensive heart disease with heart failure: Secondary | ICD-10-CM | POA: Diagnosis not present

## 2018-01-30 DIAGNOSIS — J61 Pneumoconiosis due to asbestos and other mineral fibers: Secondary | ICD-10-CM | POA: Diagnosis not present

## 2018-01-30 DIAGNOSIS — N390 Urinary tract infection, site not specified: Secondary | ICD-10-CM | POA: Diagnosis not present

## 2018-01-30 DIAGNOSIS — J09X2 Influenza due to identified novel influenza A virus with other respiratory manifestations: Secondary | ICD-10-CM | POA: Diagnosis not present

## 2018-02-05 DIAGNOSIS — I2510 Atherosclerotic heart disease of native coronary artery without angina pectoris: Secondary | ICD-10-CM | POA: Diagnosis not present

## 2018-02-05 DIAGNOSIS — I5032 Chronic diastolic (congestive) heart failure: Secondary | ICD-10-CM | POA: Diagnosis not present

## 2018-02-05 DIAGNOSIS — N390 Urinary tract infection, site not specified: Secondary | ICD-10-CM | POA: Diagnosis not present

## 2018-02-05 DIAGNOSIS — I110 Hypertensive heart disease with heart failure: Secondary | ICD-10-CM | POA: Diagnosis not present

## 2018-02-05 DIAGNOSIS — J09X2 Influenza due to identified novel influenza A virus with other respiratory manifestations: Secondary | ICD-10-CM | POA: Diagnosis not present

## 2018-02-05 DIAGNOSIS — J61 Pneumoconiosis due to asbestos and other mineral fibers: Secondary | ICD-10-CM | POA: Diagnosis not present

## 2018-02-06 DIAGNOSIS — J09X2 Influenza due to identified novel influenza A virus with other respiratory manifestations: Secondary | ICD-10-CM | POA: Diagnosis not present

## 2018-02-06 DIAGNOSIS — I110 Hypertensive heart disease with heart failure: Secondary | ICD-10-CM | POA: Diagnosis not present

## 2018-02-06 DIAGNOSIS — I5032 Chronic diastolic (congestive) heart failure: Secondary | ICD-10-CM | POA: Diagnosis not present

## 2018-02-06 DIAGNOSIS — I2510 Atherosclerotic heart disease of native coronary artery without angina pectoris: Secondary | ICD-10-CM | POA: Diagnosis not present

## 2018-02-06 DIAGNOSIS — N390 Urinary tract infection, site not specified: Secondary | ICD-10-CM | POA: Diagnosis not present

## 2018-02-06 DIAGNOSIS — J61 Pneumoconiosis due to asbestos and other mineral fibers: Secondary | ICD-10-CM | POA: Diagnosis not present

## 2018-02-10 DIAGNOSIS — D1801 Hemangioma of skin and subcutaneous tissue: Secondary | ICD-10-CM | POA: Diagnosis not present

## 2018-02-10 DIAGNOSIS — J61 Pneumoconiosis due to asbestos and other mineral fibers: Secondary | ICD-10-CM | POA: Diagnosis not present

## 2018-02-10 DIAGNOSIS — I5032 Chronic diastolic (congestive) heart failure: Secondary | ICD-10-CM | POA: Diagnosis not present

## 2018-02-10 DIAGNOSIS — I110 Hypertensive heart disease with heart failure: Secondary | ICD-10-CM | POA: Diagnosis not present

## 2018-02-10 DIAGNOSIS — J09X2 Influenza due to identified novel influenza A virus with other respiratory manifestations: Secondary | ICD-10-CM | POA: Diagnosis not present

## 2018-02-10 DIAGNOSIS — N390 Urinary tract infection, site not specified: Secondary | ICD-10-CM | POA: Diagnosis not present

## 2018-02-10 DIAGNOSIS — I2510 Atherosclerotic heart disease of native coronary artery without angina pectoris: Secondary | ICD-10-CM | POA: Diagnosis not present

## 2018-02-10 DIAGNOSIS — L821 Other seborrheic keratosis: Secondary | ICD-10-CM | POA: Diagnosis not present

## 2018-02-10 DIAGNOSIS — L814 Other melanin hyperpigmentation: Secondary | ICD-10-CM | POA: Diagnosis not present

## 2018-02-11 ENCOUNTER — Other Ambulatory Visit: Payer: Self-pay | Admitting: Family Medicine

## 2018-02-11 ENCOUNTER — Encounter: Payer: Self-pay | Admitting: Registered Nurse

## 2018-02-11 ENCOUNTER — Encounter: Payer: Medicare Other | Attending: Physical Medicine & Rehabilitation | Admitting: Registered Nurse

## 2018-02-11 ENCOUNTER — Other Ambulatory Visit: Payer: Self-pay

## 2018-02-11 VITALS — BP 110/68 | HR 85

## 2018-02-11 DIAGNOSIS — M1712 Unilateral primary osteoarthritis, left knee: Secondary | ICD-10-CM

## 2018-02-11 DIAGNOSIS — M1711 Unilateral primary osteoarthritis, right knee: Secondary | ICD-10-CM | POA: Diagnosis not present

## 2018-02-11 DIAGNOSIS — Z79899 Other long term (current) drug therapy: Secondary | ICD-10-CM | POA: Insufficient documentation

## 2018-02-11 DIAGNOSIS — G894 Chronic pain syndrome: Secondary | ICD-10-CM | POA: Diagnosis not present

## 2018-02-11 DIAGNOSIS — M25512 Pain in left shoulder: Secondary | ICD-10-CM | POA: Diagnosis not present

## 2018-02-11 DIAGNOSIS — M542 Cervicalgia: Secondary | ICD-10-CM | POA: Diagnosis not present

## 2018-02-11 DIAGNOSIS — Z5181 Encounter for therapeutic drug level monitoring: Secondary | ICD-10-CM | POA: Diagnosis not present

## 2018-02-11 DIAGNOSIS — M25511 Pain in right shoulder: Secondary | ICD-10-CM | POA: Diagnosis not present

## 2018-02-11 DIAGNOSIS — M797 Fibromyalgia: Secondary | ICD-10-CM | POA: Insufficient documentation

## 2018-02-11 DIAGNOSIS — R2689 Other abnormalities of gait and mobility: Secondary | ICD-10-CM | POA: Diagnosis not present

## 2018-02-11 DIAGNOSIS — M5416 Radiculopathy, lumbar region: Secondary | ICD-10-CM

## 2018-02-11 MED ORDER — OXYCODONE-ACETAMINOPHEN 10-325 MG PO TABS: 1.0000 | ORAL_TABLET | Freq: Four times a day (QID) | ORAL | 0 refills | Status: DC | PRN

## 2018-02-11 NOTE — Progress Notes (Signed)
Subjective:    Patient ID: Linda [name], female    DOB: 26-Dec-1945, 72 y.o.   MRN: [id number]  HPI: [name] is a 72year old female who returns for follow up appointmentfor chronic pain and medication refill. She states her pain is located in her neck, bilateral shoulders, lower back and bilateral knees. She rates her pain 5. Her current exercise regime is walking, also reports she has completed two months of physical therapy. S/P Bilateral Zilretta injection with good relief noted she states.  Ms. Stumpp Morphine equivalent is  50.00MME.  She  is also prescribed Alprazolam by [name]. .We have discussed the black box warning of using opioids and benzodiazepines. I highlighted the dangers of using these drugs together and discussed the adverse events including respiratory suppression, overdose, cognitive impairment and importance of  compliance with current regimen. Sheverbalizes understanding, we will continue to monitor and adjust as indicated.    Oral Swab was performed on 12/11/2017, it was consistent.  Pain Inventory Average Pain 6 Pain Right Now 5 My pain is intermittent, stabbing and aching  In the last 24 hours, has pain interfered with the following? General activity 7 Relation with others 7 Enjoyment of life 8 What TIME of day is your pain at its worst? daytime evening Sleep (in general) Fair  Pain is worse with: walking, bending, standing and some activites Pain improves with: rest and medication Relief from Meds: 7  Mobility walk with assistance use a walker how many minutes can you walk? 10 ability to climb steps?  no do you drive?  yes  Function retired I need assistance with the following:  dressing, meal prep, household duties and shopping  Neuro/Psych bladder control problems weakness anxiety  Prior Studies Any changes since last visit?  no  Physicians involved in your care Any changes since last visit?  no   Family  History  Problem Relation Age of Onset  . Arthritis Mother   . Kidney disease Mother   . Heart disease Father   . Stroke Father   . Hypertension Father   . Diabetes Father   . Heart attack Father   . Heart attack Paternal Grandmother   . Diabetes Sister        one sister  . Hypertension Sister   . Hypertension Brother   . Multiple sclerosis Son    Social History   Socioeconomic History  . Marital status: Widowed    Spouse name: Not on file  . Number of children: 2  . Years of education: Not on file  . Highest education level: Not on file  Social Needs  . Financial resource strain: Not on file  . Food insecurity - worry: Not on file  . Food insecurity - inability: Not on file  . Transportation needs - medical: Not on file  . Transportation needs - non-medical: Not on file  Occupational History  . 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 Smoking status: Never Smoker  . Smokeless tobacco: Never Used  . Tobacco comment: never used tobacco  Substance and Sexual Activity  . Alcohol use: No    Alcohol/week: 0.0 oz  . Drug use: No  . Sexual activity: Not Currently  Other Topics Concern  . Not on file  Social History Narrative   Widowed, 2 sons.  Relocated to [place] 09/2012 to be closer to her son who has MS.   Husband d 2015--mesothelioma.  Occupation: former Pharmacist, hospital.   Education: masters degree level.   No T/A/Ds.   Lives in [place], independent living.   Past Surgical History:  Procedure Laterality Date  . APPENDECTOMY  1960  . AXILLARY SURGERY Left 1978   Multiple "lump" in armpit per pt  . CARDIAC CATHETERIZATION  01/2013   nonobstructive CAD, EF 55-60%  . CARDIOVASCULAR STRESS TEST  02/22/15   Low risk myocard perf imaging; wall motion normal, normal EF  . carotid duplex doppler  10/21/2017   R vertebral flow suggestive of possible distal obstruction.  Pt declined further w/u as of 10/29/17 but need to revisit this problem  periodically.  . COCCYX REMOVAL  1972  . DEXA  06/05/2017   T-score -3.1  . DILATION AND CURETTAGE OF UTERUS  ? 1970's  . ESOPHAGOGASTRODUODENOSCOPY N/A 07/19/2014   Gastritis found + in the setting of supratherapeutic INR, +plavix, + meloxicam.  . EYE SURGERY Left 2012-2013   "injections for ~ 1 yr; don't really know what for" (07/12/2013)  . HEEL SPUR SURGERY Left 2008  . KNEE SURGERY  2005  . LEFT HEART CATHETERIZATION WITH CORONARY ANGIOGRAM N/A 01/30/2013   Procedure: LEFT HEART CATHETERIZATION WITH CORONARY ANGIOGRAM;  Surgeon: Clent Demark, MD;  Location: [facility] C[facility] LAB;  Service: Cardiovascular;  Laterality: N/A;  . PLANTAR FASCIA RELEASE Left 2008  . SPIROMETRY  04/25/14   In hosp for acute asthma/COPD flare: mixed obstructive and restrictive lung disease. The FEV1 is severely reduced at 45% predicted.  FEV1 signif decreased compared to prior spirometry 07/23/13.  . TENDON RELEASE  1996   Right forearm and hand  . TOTAL ABDOMINAL HYSTERECTOMY  1974  . TRANSTHORACIC ECHOCARDIOGRAM  01/2013; 04/2014;08/2015; 09/2017   2014--NORMAL.  2015--focal basal septal hypertrophy, EF 55-60%, grade I diast dysfxn, mild LAE.  08/2015 EF 55-60%, nl LV syst fxn, grade I DD, valves wnl. 10/21/17: EF 65-70%, grd I DD, o/w normal.   Past Medical History:  Diagnosis Date  . Acute upper GI bleed 06/2014   while pt taking coumadin, plavix, and meloxicam---despite being told not to take coumadin.  . Anginal pain ([place])    Nonobstructive CAD 2014; however, her cardiologist put her on a statin for this and NOT for hyperlipidemia per pt report.  Atyp CP 08/2017 at card f/u, plan for myoc perf imaging.  . Anxiety    panic attacks  . Asthma    w/ asbestososis   . BPPV (benign paroxysmal positional vertigo) 12/16/2012  . Chronic diastolic CHF (congestive heart failure) (HCC)    dry wt as of 11/06/16 is 168 lbs.  . Chronic lower back pain   . COPD (chronic obstructive pulmonary disease) ([place])   . DDD (degenerative  disc disease)    lumbar and cervical.   . Diverticular disease   . Fibromyalgia    Patient states dx was around her late 35s but she had sx's for years prior to this.  . H/O hiatal hernia   . Hay fever   . History of pneumonia    hospitalized 12/2011, 02/2013, and 07/2013 [facility]) for this  . HTN (hypertension)   . Idiopathic angio-edema-urticaria 72014   Angioedema component was very minimal  . Insomnia   . Iron deficiency anemia    Hematologist in [place], [place] did extensive w/u; no cause found; failed oral supplement;; gets fairly regular (q80m or so) IV iron infusions (Venofer -iron sucrose- 200mg  with procrit.  "for 14 yr I've been getting blood work q month &  getting infusions prn" (07/12/2013).  Dr. Marin Olp locally, iron infusions done, EPO deficiency dx'd  . Migraine syndrome    "not as often anymore; used to be ~ q wk" (07/12/2013)  . Mixed incontinence urge and stress   . Nephrolithiasis    "passed all on my own or they are still in there" (07/12/2013)  . OSA on CPAP    prior to move to [place]--had another sleep study 10/2015 w/pulm Dr. Camillo Flaming.  . Osteoarthritis    "severe; progressing fast" (07/12/2013); multiple joints-not surgical candidate for TKR (03/2015).  Triamcinolon knee injections by Dr. Tessa Lerner 12/2017.  [name] Kitchen Parkinsonism [facility]) 2018   Dr. Carles Collet, neuro, saw her 11/24/17 and recommended d/c of abilify as first step.   [name] Kitchen Pernicious anemia 08/24/2014  . Pleural plaque with presence of asbestos 07/22/2013  . Pulmonary embolism ([place]) 07/2013   Dx at [facility] with very small peripheral upper lobe pe 07/2013: pt took coumadin for about 8-9 mo  . Pyelonephritis    "several times over the last 30 yr" (07/12/2013)  . RBBB (right bundle branch block)   . Recurrent major depression ([place])   . Recurrent UTI    hx of hospitalization for pyelonephritis; started abx prophylaxis 06/2015  . Syncope    Hypotensive; ED visit--Dr. Terrence Dupont did Cath--nonobstructive CAD, EF 55-60%.  In retrospect, suspect pt rx med  misuse/polypharmacy  . Tension headache, chronic    BP 110/68 (Patient Position: Sitting, Cuff Size: Normal)   Pulse 85   SpO2 96%   Opioid Risk Score:  1 Fall Risk Score:  `1  Depression screen PHQ 2/9  Depression screen [facility] 2/9 02/11/2018 01/14/2018 12/11/2017 11/17/2017 10/20/2017 09/17/2017 08/18/2017  Decreased Interest 1 0 1 1 1 1  0  Down, Depressed, Hopeless 1 0 1 1 1 1 3   PHQ - 2 Score 2 0 2 2 2 2 3   Altered sleeping - - - - - - -  Tired, decreased energy - - - - - - -  Change in appetite - - - - - - -  Feeling bad or failure about yourself  - - - - - - -  Trouble concentrating - - - - - - -  Moving slowly or fidgety/restless - - - - - - -  Suicidal thoughts - - - - - - -  PHQ-9 Score - - - - - - -  Some recent data might be hidden    Review of Systems  Constitutional: Positive for unexpected weight change.  HENT: Negative.   Eyes: Negative.   Respiratory: Positive for shortness of breath.   Cardiovascular: Positive for leg swelling.  Gastrointestinal: Positive for constipation and nausea.  Endocrine: Negative.   Genitourinary:       Bladder control  Musculoskeletal: Positive for gait problem.  Skin: Negative.   Allergic/Immunologic: Negative.   Neurological: Positive for weakness.  Hematological: Negative.   Psychiatric/Behavioral: The patient is nervous/anxious.   All other systems reviewed and are negative.      Objective:   Physical Exam  Constitutional: She is oriented to person, place, and time. She appears well-developed and well-nourished.  HENT:  Head: Normocephalic and atraumatic.  Neck: Normal range of motion. Neck supple.  Cervical Paraspinal Tenderness: C-5-C-6  Cardiovascular: Normal rate and regular rhythm.  Pulmonary/Chest: Effort normal and breath sounds normal.  Musculoskeletal:  Normal Muscle Bulk and Muscle Testing Reveals: Upper Extremities: Full ROM and Muscle Strength 5/5 Bilateral AC Joint Tenderness Thoracic Paraspinal  Tenderness: T-7-T-9 Lumbar Paraspinal Tenderness:  L-4-L-5 Lower Extremities: Full ROM and Muscle Strength 5/5 Arises from chair with ease Narrow Based Gait  Neurological: She is alert and oriented to person, place, and time.  Skin: Skin is warm and dry.  Psychiatric: She has a normal mood and affect.  Nursing note and vitals reviewed.         Assessment & Plan:  1. Functional deficits secondary to Gait disorder:Continue with HEP.02/11/2018 2. Chronic Back pain/ Lumbar Radiculitis/fibromyalgia /R>L Knee OA Pain Management: 02/11/2018 Refilled: Oxycodone 10/325mg one tablet every 6 hours #100 and Voltaren Gel We will continue the opioid monitoring program, this consists of regular clinic visits, examinations, urine drug screen, pill counts as well as use of [place] Controlled Substance reporting System. 3. Depression with anxiety/Grief reaction/Mood: 02/11/2018 Continue Xanax,Trazodone and Cymbalta .  4. [name] with asthma:. Albuterol prn. :Pulmonology Following. 02/11/2018 5. Bilateral Osteoarthritis of Bilateral Knees: S/PZilretta Injection with Good Relief Noted.  Dr. Juanda Bond Orthopedist.. 02/11/2018 6. Fibromyalgia: Continue Home exercise regime. 02/11/2018  20 minutes of face to face patient care time was spent during this visit. All questions were encouraged and answered.  F/U in 1 month

## 2018-02-11 NOTE — Telephone Encounter (Signed)
[facility]  RF request for trazodone LOV: 01/22/18 Next ov: 03/23/18 Last written: 05/21/17 #120 w/ 6RF  Please advise. Thanks.

## 2018-02-12 ENCOUNTER — Ambulatory Visit: Payer: Medicare Other | Admitting: Registered Nurse

## 2018-03-11 ENCOUNTER — Encounter: Payer: Medicare Other | Attending: Physical Medicine & Rehabilitation | Admitting: Registered Nurse

## 2018-03-11 ENCOUNTER — Encounter: Payer: Self-pay | Admitting: Registered Nurse

## 2018-03-11 ENCOUNTER — Other Ambulatory Visit: Payer: Self-pay

## 2018-03-11 ENCOUNTER — Other Ambulatory Visit: Payer: Self-pay | Admitting: Family Medicine

## 2018-03-11 ENCOUNTER — Ambulatory Visit: Payer: Medicare Other | Admitting: Registered Nurse

## 2018-03-11 ENCOUNTER — Other Ambulatory Visit: Payer: Self-pay | Admitting: Cardiology

## 2018-03-11 VITALS — BP 107/69 | HR 79

## 2018-03-11 DIAGNOSIS — Z5181 Encounter for therapeutic drug level monitoring: Secondary | ICD-10-CM

## 2018-03-11 DIAGNOSIS — R2689 Other abnormalities of gait and mobility: Secondary | ICD-10-CM | POA: Insufficient documentation

## 2018-03-11 DIAGNOSIS — M797 Fibromyalgia: Secondary | ICD-10-CM

## 2018-03-11 DIAGNOSIS — M542 Cervicalgia: Secondary | ICD-10-CM | POA: Diagnosis not present

## 2018-03-11 DIAGNOSIS — Z79899 Other long term (current) drug therapy: Secondary | ICD-10-CM | POA: Insufficient documentation

## 2018-03-11 DIAGNOSIS — M1711 Unilateral primary osteoarthritis, right knee: Secondary | ICD-10-CM | POA: Insufficient documentation

## 2018-03-11 DIAGNOSIS — G894 Chronic pain syndrome: Secondary | ICD-10-CM

## 2018-03-11 DIAGNOSIS — M5416 Radiculopathy, lumbar region: Secondary | ICD-10-CM

## 2018-03-11 DIAGNOSIS — M1712 Unilateral primary osteoarthritis, left knee: Secondary | ICD-10-CM | POA: Diagnosis not present

## 2018-03-11 MED ORDER — OXYCODONE-ACETAMINOPHEN 10-325 MG PO TABS: 1.0000 | ORAL_TABLET | Freq: Four times a day (QID) | ORAL | 0 refills | Status: DC | PRN

## 2018-03-11 NOTE — Patient Instructions (Signed)
Call Office the week of April 15th regarding knee injection with Dr. Naaman Plummer.

## 2018-03-11 NOTE — Progress Notes (Signed)
Subjective:    Patient ID: Linda [name], female    DOB: [birth year], 72 y.o.   MRN: [id number]  HPI: Linda [name] is a 72year old female who returns for follow up appointmentfor chronic pain and medication refill. She states her pain is located in her neck, lower back and bilateral knees. Also reports she is experiencing constipation medication list reviewed she was prescribed Amitiza by [name] in January. Placed a call to [facility], spoke with pharmacist she didn't call for a refill, a refill will be sent to Linda [name]. Linda [name] instructed to call office if no relief noted, then we will give [employer] samples, she verbalizes understanding. She rates her pain 7. Her current exercise regime is walking.  Linda [name] equivalent is  52.00MME.  She  is also prescribed Alprazolam by [name]. .We have reviewedthe black box warning of using opioids and benzodiazepines. I highlighted the dangers of using these drugs together and discussed the adverse events including respiratory suppression, overdose, cognitive impairment and importance of  compliance with current regimen. Sheverbalizes understanding, we will continue to monitor and adjust as indicated.    Oral Swab was performed on 12/11/2017, it was consistent.  Pain Inventory Average Pain 7 Pain Right Now 7 My pain is intermittent, stabbing and aching  In the last 24 hours, has pain interfered with the following? General activity 8 Relation with others 8 Enjoyment of life 8 What TIME of day is your pain at its worst? daytime evening Sleep (in general) Fair  Pain is worse with: walking, bending, standing and some activites Pain improves with: rest and medication Relief from Meds: 7  Mobility walk with assistance use a walker how many minutes can you walk? 15 ability to climb steps?  no do you drive?  yes  Function retired I need assistance with the following:  dressing, meal prep, household  duties and shopping  Neuro/Psych bladder control problems weakness anxiety  Prior Studies Any changes since last visit?  no  Physicians involved in your care Any changes since last visit?  no   Family History  Problem Relation Age of Onset  . Arthritis Mother   . Kidney disease Mother   . Heart disease Father   . Stroke Father   . Hypertension Father   . Diabetes Father   . Heart attack Father   . Heart attack Paternal Grandmother   . Diabetes Sister        one sister  . Hypertension Sister   . Hypertension Brother   . Multiple sclerosis Son    Social History   Socioeconomic History  . Marital status: Widowed    Spouse name: None  . Number of children: 2  . Years of education: None  . Highest education level: None  Social Needs  . Financial resource strain: None  . Food insecurity - worry: None  . Food insecurity - inability: None  . Transportation needs - medical: None  . Transportation needs - non-medical: None  Occupational History  . 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 Smoking status: Never Smoker  . Smokeless tobacco: Never Used  . Tobacco comment: never used tobacco  Substance and Sexual Activity  . Alcohol use: No    Alcohol/week: 0.0 oz  . Drug use: No  . Sexual activity: Not Currently  Other Topics Concern  . None  Social History Narrative   Widowed, 2 sons.  Relocated to [place] 09/2012 to be closer to her son who has MS.   Husband d 2015--mesothelioma.   Occupation: former Pharmacist, hospital.   Education: masters degree level.   No T/A/Ds.   Lives in [place], independent living.   Past Surgical History:  Procedure Laterality Date  . APPENDECTOMY  1960  . AXILLARY SURGERY Left 1978   Multiple "lump" in armpit per pt  . CARDIAC CATHETERIZATION  01/2013   nonobstructive CAD, EF 55-60%  . CARDIOVASCULAR STRESS TEST  02/22/15   Low risk myocard perf imaging; wall motion normal, normal EF  . carotid duplex doppler   10/21/2017   R vertebral flow suggestive of possible distal obstruction.  Pt declined further w/u as of 10/29/17 but need to revisit this problem periodically.  . COCCYX REMOVAL  1972  . DEXA  06/05/2017   T-score -3.1  . DILATION AND CURETTAGE OF UTERUS  ? 1970's  . ESOPHAGOGASTRODUODENOSCOPY N/A 07/19/2014   Gastritis found + in the setting of supratherapeutic INR, +plavix, + meloxicam.  . EYE SURGERY Left 2012-2013   "injections for ~ 1 yr; don't really know what for" (07/12/2013)  . HEEL SPUR SURGERY Left 2008  . KNEE SURGERY  2005  . LEFT HEART CATHETERIZATION WITH CORONARY ANGIOGRAM N/A 01/30/2013   Procedure: LEFT HEART CATHETERIZATION WITH CORONARY ANGIOGRAM;  Surgeon: Clent Demark, MD;  Location: [facility] CATH LAB;  Service: Cardiovascular;  Laterality: N/A;  . PLANTAR FASCIA RELEASE Left 2008  . SPIROMETRY  04/25/14   In hosp for acute asthma/COPD flare: mixed obstructive and restrictive lung disease. The FEV1 is severely reduced at 45% predicted.  FEV1 signif decreased compared to prior spirometry 07/23/13.  . TENDON RELEASE  1996   Right forearm and hand  . TOTAL ABDOMINAL HYSTERECTOMY  1974  . TRANSTHORACIC ECHOCARDIOGRAM  01/2013; 04/2014;08/2015; 09/2017   2014--NORMAL.  2015--focal basal septal hypertrophy, EF 55-60%, grade I diast dysfxn, mild LAE.  08/2015 EF 55-60%, nl LV syst fxn, grade I DD, valves wnl. 10/21/17: EF 65-70%, grd I DD, o/w normal.   Past Medical History:  Diagnosis Date  . Acute upper GI bleed 06/2014   while pt taking coumadin, plavix, and meloxicam---despite being told not to take coumadin.  . Anginal pain ([place])    Nonobstructive CAD 2014; however, her cardiologist put her on a statin for this and NOT for hyperlipidemia per pt report.  Atyp CP 08/2017 at card f/u, plan for myoc perf imaging.  . Anxiety    panic attacks  . Asthma    w/ asbestososis   . BPPV (benign paroxysmal positional vertigo) 12/16/2012  . Chronic diastolic CHF (congestive heart failure) (HCC)     dry wt as of 11/06/16 is 168 lbs.  . Chronic lower back pain   . COPD (chronic obstructive pulmonary disease) ([place])   . DDD (degenerative disc disease)    lumbar and cervical.   . Diverticular disease   . Fibromyalgia    Patient states dx was around her late 35s but she had sx's for years prior to this.  . H/O hiatal hernia   . Hay fever   . History of pneumonia    hospitalized 12/2011, 02/2013, and 07/2013 [facility]) for this  . HTN (hypertension)   . Idiopathic angio-edema-urticaria 72014   Angioedema component was very minimal  . Insomnia   . Iron deficiency anemia    Hematologist in [place], [place] did extensive w/u; no cause found; failed oral supplement;; gets fairly regular (q54m or so)  IV iron infusions (Venofer -iron sucrose- 200mg  with procrit.  "for 14 yr I've been getting blood work q month & getting infusions prn" (07/12/2013).  Dr. Marin Olp locally, iron infusions done, EPO deficiency dx'd  . Migraine syndrome    "not as often anymore; used to be ~ q wk" (07/12/2013)  . Mixed incontinence urge and stress   . Nephrolithiasis    "passed all on my own or they are still in there" (07/12/2013)  . OSA on CPAP    prior to move to --had another sleep study 10/2015 w/pulm Dr. Camillo Flaming.  . Osteoarthritis    "severe; progressing fast" (07/12/2013); multiple joints-not surgical candidate for TKR (03/2015).  Triamcinolon knee injections by Dr. Tessa Lerner 12/2017.  [name] Kitchen Parkinsonism [facility]) 2018   Dr. Carles Collet, neuro, saw her 11/24/17 and recommended d/c of abilify as first step.   [name] Kitchen Pernicious anemia 08/24/2014  . Pleural plaque with presence of asbestos 07/22/2013  . Pulmonary embolism ([place]) 07/2013   Dx at [facility] with very small peripheral upper lobe pe 07/2013: pt took coumadin for about 8-9 mo  . Pyelonephritis    "several times over the last 30 yr" (07/12/2013)  . RBBB (right bundle branch block)   . Recurrent major depression ([place])   . Recurrent UTI    hx of hospitalization for pyelonephritis; started abx  prophylaxis 06/2015  . Syncope    Hypotensive; ED visit--Dr. Terrence Dupont did Cath--nonobstructive CAD, EF 55-60%.  In retrospect, suspect pt rx med misuse/polypharmacy  . Tension headache, chronic    BP 107/69   Pulse 79   SpO2 95%   Opioid Risk Score:  1 Fall Risk Score:  `1  Depression screen PHQ 2/9  Depression screen [facility] 2/9 03/11/2018 02/11/2018 01/14/2018 12/11/2017 11/17/2017 10/20/2017 09/17/2017  Decreased Interest 1 1 0 1 1 1 1   Down, Depressed, Hopeless 1 1 0 1 1 1 1   PHQ - 2 Score 2 2 0 2 2 2 2   Altered sleeping - - - - - - -  Tired, decreased energy - - - - - - -  Change in appetite - - - - - - -  Feeling bad or failure about yourself  - - - - - - -  Trouble concentrating - - - - - - -  Moving slowly or fidgety/restless - - - - - - -  Suicidal thoughts - - - - - - -  PHQ-9 Score - - - - - - -  Some recent data might be hidden    Review of Systems  Constitutional: Positive for unexpected weight change.  HENT: Negative.   Eyes: Negative.   Respiratory: Positive for shortness of breath.   Cardiovascular: Positive for leg swelling.  Gastrointestinal: Positive for constipation and nausea.  Endocrine: Negative.   Genitourinary:       Bladder control  Musculoskeletal: Positive for gait problem.  Skin: Negative.   Allergic/Immunologic: Negative.   Neurological: Positive for weakness.  Hematological: Negative.   Psychiatric/Behavioral: The patient is nervous/anxious.   All other systems reviewed and are negative.      Objective:   Physical Exam  Constitutional: She is oriented to person, place, and time. She appears well-developed and well-nourished.  HENT:  Head: Normocephalic and atraumatic.  Neck: Normal range of motion. Neck supple.  Cervical Paraspinal Tenderness: C-5-C-6  Cardiovascular: Normal rate and regular rhythm.  Pulmonary/Chest: Effort normal and breath sounds normal.  Musculoskeletal:  Normal Muscle Bulk and Muscle Testing Reveals: Upper  Extremities: Full ROM  and Muscle Strength 5/5 Thoracic Hypersensitivity Lumbar Paraspinal Tenderness: L-4-L-5 Lower Extremities: Full ROM and Muscle Strength 5/5 Bilateral Lower Extremities Flexion Produces Pain into Bilateral Patella's Arises from chair with ease Narrow Based Gait  Neurological: She is alert and oriented to person, place, and time.  Skin: Skin is warm and dry.  Psychiatric: She has a normal mood and affect.  Nursing note and vitals reviewed.         Assessment & Plan:  1. Functional deficits secondary to Gait disorder:Continue with HEP.03/11/2018 2. Chronic Back pain/ Lumbar Radiculitis/fibromyalgia /R>L Knee OA Pain Management: 03/11/2018 Refilled: Oxycodone 10/325mg one tablet every 6 hours #100 and Voltaren Gel We will continue the opioid monitoring program, this consists of regular clinic visits, examinations, urine drug screen, pill counts as well as use of [place] Controlled Substance reporting System. 3. Depression with anxiety/Grief reaction/Mood: 03/11/2018 Continue Xanax,Trazodone and Cymbalta .  4. [name] with asthma:. Albuterol prn. :Pulmonology Following. 03/11/2018 5. Bilateral Osteoarthritis of Bilateral Knees: S/PZilretta Injection on 01/14/2018 with Good Relief Noted.  Dr. Juanda Bond Orthopedist.. 03/11/2018 6. Fibromyalgia: Continue Home exercise regime. 03/11/2018 7. Cervicalgia: Continue HEP as tolerated. Alternate with heat and ice therapy. Continue to monitor. 03/11/18  20 minutes of face to face patient care time was spent during this visit. All questions were encouraged and answered.  F/U in 1 month

## 2018-03-23 ENCOUNTER — Ambulatory Visit: Payer: Medicare Other | Admitting: Family Medicine

## 2018-03-25 ENCOUNTER — Ambulatory Visit (INDEPENDENT_AMBULATORY_CARE_PROVIDER_SITE_OTHER): Payer: Medicare Other | Admitting: Family Medicine

## 2018-03-25 ENCOUNTER — Encounter: Payer: Self-pay | Admitting: Family Medicine

## 2018-03-25 VITALS — BP 109/66 | HR 81 | Temp 98.1°F | Resp 16 | Ht 63.0 in | Wt 143.2 lb

## 2018-03-25 DIAGNOSIS — J310 Chronic rhinitis: Secondary | ICD-10-CM | POA: Diagnosis not present

## 2018-03-25 DIAGNOSIS — K5903 Drug induced constipation: Secondary | ICD-10-CM | POA: Diagnosis not present

## 2018-03-25 DIAGNOSIS — G2111 Neuroleptic induced parkinsonism: Secondary | ICD-10-CM

## 2018-03-25 DIAGNOSIS — F411 Generalized anxiety disorder: Secondary | ICD-10-CM | POA: Diagnosis not present

## 2018-03-25 DIAGNOSIS — F39 Unspecified mood [affective] disorder: Secondary | ICD-10-CM | POA: Diagnosis not present

## 2018-03-25 DIAGNOSIS — J302 Other seasonal allergic rhinitis: Secondary | ICD-10-CM | POA: Diagnosis not present

## 2018-03-25 DIAGNOSIS — T402X5A Adverse effect of other opioids, initial encounter: Secondary | ICD-10-CM | POA: Diagnosis not present

## 2018-03-25 MED ORDER — NALDEMEDINE TOSYLATE 0.2 MG PO TABS: 1.0000 | ORAL_TABLET | Freq: Every day | ORAL | 6 refills | Status: DC

## 2018-03-25 NOTE — Progress Notes (Signed)
OFFICE VISIT  03/25/2018   CC:  Chief Complaint  Patient presents with  . Follow-up    psych/constipation/nasal symptoms     HPI:    Patient is a 72 y.o. Caucasian female who presents for 2 mo f/u abilify-induced parkinsonism.  Switch to lamictal at the time of last visit was helping significantly. Continues to improve!  Mood/anxiety stable on lamictal, cymbalta, and xanax. She is very happy with her return to normal since getting off of abilify.  Also, significant constipation last visit and I felt this was acutely worse b/c of increased opioid requirement in the time leading up to last visit: increased miralax to 1 cap full bid and rx'd amitiza 24 mcg bid. This has led to a bit of improvement but pt this has only small BM every few days.  Takes colace daily. No abd pain.  Has to strain with BMs and BMs are hard.  Chronic nasal cong nose + gustatory rhinitis last visit: I started her on flonase and ipratropium nasal spray. This has helped!  No more signif nasal sx's.  ROS: no focal weakness, no rash, no cough, no fevers.  Past Medical History:  Diagnosis Date  . Acute upper GI bleed 06/2014   while pt taking coumadin, plavix, and meloxicam---despite being told not to take coumadin.  . Anginal pain ([place])    Nonobstructive CAD 2014; however, her cardiologist put her on a statin for this and NOT for hyperlipidemia per pt report.  Atyp CP 08/2017 at card f/u, plan for myoc perf imaging.  . Anxiety    panic attacks  . Asthma    w/ asbestososis   . BPPV (benign paroxysmal positional vertigo) 12/16/2012  . Chronic diastolic CHF (congestive heart failure) (HCC)    dry wt as of 11/06/16 is 168 lbs.  . Chronic lower back pain   . COPD (chronic obstructive pulmonary disease) ([place])   . DDD (degenerative disc disease)    lumbar and cervical.   . Diverticular disease   . Fibromyalgia    Patient states dx was around her late 78s but she had sx's for years prior to this.  . H/O hiatal  hernia   . Hay fever   . History of pneumonia    hospitalized 12/2011, 02/2013, and 07/2013 [facility]) for this  . HTN (hypertension)   . Idiopathic angio-edema-urticaria 72014   Angioedema component was very minimal  . Insomnia   . Iron deficiency anemia    Hematologist in [place], [place] did extensive w/u; no cause found; failed oral supplement;; gets fairly regular (q65m or so) IV iron infusions (Venofer -iron sucrose- 200mg  with procrit.  "for 14 yr I've been getting blood work q month & getting infusions prn" (07/12/2013).  Dr. Marin Olp locally, iron infusions done, EPO deficiency dx'd  . Migraine syndrome    "not as often anymore; used to be ~ q wk" (07/12/2013)  . Mixed incontinence urge and stress   . Nephrolithiasis    "passed all on my own or they are still in there" (07/12/2013)  . OSA on CPAP    prior to move to [place]--had another sleep study 10/2015 w/pulm Dr. Camillo Flaming.  . Osteoarthritis    "severe; progressing fast" (07/12/2013); multiple joints-not surgical candidate for TKR (03/2015).  Triamcinolon knee injections by Dr. Tessa Lerner 12/2017.  [name] Kitchen Parkinsonism [facility]) 2018   Dr. Carles Collet, neuro, saw her 11/24/17 and recommended d/c of abilify as first step.   [name] Kitchen Pernicious anemia 08/24/2014  . Pleural plaque with presence  of asbestos 07/22/2013  . Pulmonary embolism ([place]) 07/2013   Dx at [facility] with very small peripheral upper lobe pe 07/2013: pt took coumadin for about 8-9 mo  . Pyelonephritis    "several times over the last 30 yr" (07/12/2013)  . RBBB (right bundle branch block)   . Recurrent major depression ([place])   . Recurrent UTI    hx of hospitalization for pyelonephritis; started abx prophylaxis 06/2015  . Syncope    Hypotensive; ED visit--Dr. Terrence Dupont did Cath--nonobstructive CAD, EF 55-60%.  In retrospect, suspect pt rx med misuse/polypharmacy  . Tension headache, chronic     Past Surgical History:  Procedure Laterality Date  . APPENDECTOMY  1960  . AXILLARY SURGERY Left 1978   Multiple "lump" in  armpit per pt  . CARDIAC CATHETERIZATION  01/2013   nonobstructive CAD, EF 55-60%  . CARDIOVASCULAR STRESS TEST  02/22/15   Low risk myocard perf imaging; wall motion normal, normal EF  . carotid duplex doppler  10/21/2017   R vertebral flow suggestive of possible distal obstruction.  Pt declined further w/u as of 10/29/17 but need to revisit this problem periodically.  . COCCYX REMOVAL  1972  . DEXA  06/05/2017   T-score -3.1  . DILATION AND CURETTAGE OF UTERUS  ? 1970's  . ESOPHAGOGASTRODUODENOSCOPY N/A 07/19/2014   Gastritis found + in the setting of supratherapeutic INR, +plavix, + meloxicam.  . EYE SURGERY Left 2012-2013   "injections for ~ 1 yr; don't really know what for" (07/12/2013)  . HEEL SPUR SURGERY Left 2008  . KNEE SURGERY  2005  . LEFT HEART CATHETERIZATION WITH CORONARY ANGIOGRAM N/A 01/30/2013   Procedure: LEFT HEART CATHETERIZATION WITH CORONARY ANGIOGRAM;  Surgeon: Clent Demark, MD;  Location: [facility] CATH LAB;  Service: Cardiovascular;  Laterality: N/A;  . PLANTAR FASCIA RELEASE Left 2008  . SPIROMETRY  04/25/14   In hosp for acute asthma/COPD flare: mixed obstructive and restrictive lung disease. The FEV1 is severely reduced at 45% predicted.  FEV1 signif decreased compared to prior spirometry 07/23/13.  . TENDON RELEASE  1996   Right forearm and hand  . TOTAL ABDOMINAL HYSTERECTOMY  1974  . TRANSTHORACIC ECHOCARDIOGRAM  01/2013; 04/2014;08/2015; 09/2017   2014--NORMAL.  2015--focal basal septal hypertrophy, EF 55-60%, grade I diast dysfxn, mild LAE.  08/2015 EF 55-60%, nl LV syst fxn, grade I DD, valves wnl. 10/21/17: EF 65-70%, grd I DD, o/w normal.    Outpatient Medications Prior to Visit  Medication Sig Dispense Refill  . albuterol (PROVENTIL HFA;VENTOLIN HFA) 108 (90 BASE) MCG/ACT inhaler Inhale 2 puffs into the lungs every 6 (six) hours as needed for wheezing or shortness of breath. 1 Inhaler 1  . alendronate (FOSAMAX) 70 MG tablet Take 1 tablet (70 mg total) by mouth every  7 (seven) days. Take with a full glass of water on an empty stomach, first thing in the morning and remain up right for 30 minutes after taking. 12 tablet 3  . ALPRAZolam (XANAX) 1 MG tablet TAKE 1 TABLET THREE TIMES DAILY AS NEEDED FOR ANXIETY. 90 tablet 5  . aspirin 81 MG tablet Take 81 mg by mouth at bedtime.    . diclofenac sodium (VOLTAREN) 1 % GEL Apply 2 g topically 3 (three) times daily as needed (pain). 2 Tube 2  . DULoxetine (CYMBALTA) 30 MG capsule TAKE 1 CAPSULE ONCE DAILY ALONG WITH 60MG  CAPSULE FOR TOTAL 90MG  DAILY. 30 capsule 5  . DULoxetine (CYMBALTA) 60 MG capsule TAKE 1 CAPSULE A DAY  TO BE COMBINED WITH 30MG  CAPSULE 90 capsule 1  . fluticasone (FLONASE) 50 MCG/ACT nasal spray Place 2 sprays into both nostrils daily. 16 g 6  . furosemide (LASIX) 40 MG tablet Take 1 tablet (40 mg total) by mouth daily. (Patient taking differently: Take 40 mg by mouth as needed for fluid. ) 60 tablet 11  . ipratropium (ATROVENT) 0.03 % nasal spray Place 2 sprays into both nostrils every 12 (twelve) hours. 30 mL 12  . isosorbide mononitrate (IMDUR) 30 MG 24 hr tablet Take 1 tablet (30 mg total) by mouth 2 (two) times daily. 60mg  in the am and 30mg  in the pm (Patient taking differently: Take 30-60 mg by mouth See admin instructions. 60mg  in the am and 30mg  in the pm) 90 tablet 3  . isosorbide mononitrate (IMDUR) 30 MG 24 hr tablet TAKE 2 TABLETS IN THE AM AND 1 TABLET IN THE PM. 90 tablet 0  . lamoTRIgine (LAMICTAL) 100 MG tablet Take 1 tablet (100 mg total) by mouth daily. 30 tablet 2  . metoprolol succinate (TOPROL-XL) 25 MG 24 hr tablet Take 1 tablet by mouth daily.    . Multiple Vitamin (MULTIVITAMIN WITH MINERALS) TABS tablet Take 1 tablet by mouth daily.    . nitroGLYCERIN (NITROSTAT) 0.4 MG SL tablet Place 1 tablet (0.4 mg total) under the tongue every 5 (five) minutes as needed for chest pain (x 3 doses). 25 tablet 3  . oxyCODONE-acetaminophen (PERCOCET) 10-325 MG tablet Take 1 tablet by mouth  every 6 (six) hours as needed for pain. 100 tablet 0  . OXYGEN Inhale 3 L into the lungs continuous.    . pantoprazole (PROTONIX) 40 MG tablet TAKE 1 TABLET BY MOUTH DAILY. 90 tablet 1  . traZODone (DESYREL) 50 MG tablet TAKE 2-4 TABLETS AT BEDTIME 120 tablet 6  . Vitamin D, Ergocalciferol, (DRISDOL) 50000 units CAPS capsule 1 tab po TWICE per week 24 capsule 3  . lubiprostone (AMITIZA) 24 MCG capsule Take 1 capsule (24 mcg total) by mouth 2 (two) times daily with a meal. 60 capsule 6   Facility-Administered Medications Prior to Visit  Medication Dose Route Frequency Provider Last Rate Last Dose  . ferumoxytol (FERAHEME) 510 mg in sodium chloride 0.9 % 100 mL IVPB  510 mg Intravenous Once Volanda Napoleon, MD        Allergies  Allergen Reactions  . Abilify [Aripiprazole] Other (See Comments)    parkinsonism  . Penicillins Itching, Swelling and Rash    Tolerated Cefepime in ED. Has patient had a PCN reaction causing immediate rash, facial/tongue/throat swelling, SOB or lightheadedness with hypotension: Yes Has patient had a PCN reaction causing severe rash involving mucus membranes or skin necrosis: No Has patient had a PCN reaction that required hospitalization: No  Has patient had a PCN reaction occurring within the last 10 years: No     ROS As per HPI  PE: Blood pressure 109/66, pulse 81, temperature 98.1 F (36.7 C), temperature source Oral, resp. rate 16, height 5\' 3"  (1.6 m), weight 143 lb 4 oz (65 kg), SpO2 98 %. Gen: Alert, well appearing.  Patient is oriented to person, place, time, and situation. AFFECT: pleasant, lucid thought and speech. Smiling frequently.  Naturally has a wide range of facial expressions now. No resting or intention tremor or pill rolling tremor. She does mover her tongue over her lips and does gentle lip smacking repetitively but when I mention this she is able to stop doing this immediately.  LABS:    Chemistry      Component Value Date/Time    NA 137 01/05/2018 0633   NA 141 06/05/2015 1315   K 3.4 (L) 01/05/2018 0633   K 3.9 06/05/2015 1315   CL 98 (L) 01/05/2018 0633   CL 103 06/05/2015 1315   CO2 28 01/05/2018 0633   CO2 28 06/05/2015 1315   BUN 19 01/05/2018 0633   BUN 14 06/05/2015 1315   CREATININE 0.60 01/05/2018 0633   CREATININE 0.87 02/03/2017 1523      Component Value Date/Time   CALCIUM 8.8 (L) 01/05/2018 0633   CALCIUM 9.0 06/05/2015 1315   ALKPHOS 58 01/01/2018 1052   ALKPHOS 84 06/05/2015 1315   AST 29 01/01/2018 1052   AST 23 06/05/2015 1315   ALT 16 01/01/2018 1052   ALT 17 06/05/2015 1315   BILITOT 1.2 01/01/2018 1052   BILITOT 1.00 06/05/2015 1315     Lab Results  Component Value Date   WBC 7.7 01/05/2018   HGB 13.9 01/05/2018   HCT 42.1 01/05/2018   MCV 88.3 01/05/2018   PLT 182 01/05/2018   Lab Results  Component Value Date   TSH 0.745 10/21/2017     IMPRESSION AND PLAN:  1) Neuroleptic-induced parkinsonism: essentially resolved. She has some habitual lip licking/lips smacking that is likely habit due to her dry mouth from meds. She can voluntarily stop doing these motions----tardive dyskinesia unlikely.  Watch over time.  2) Mood disorder; treatment resistant depression : doing well on lamictal 100 mg qd and cymbalta 90 mg qd.  3) GAD: doing well on xanax 1mg  tid, no new rx given for this today.  4) Opioid-induced constipation: no improvement with increase in miralax and addition of amitiza. D/C amitiza, start symproic 0.2mg  qd.  5) Allergic rhinitis + gustatory rhinitis: she is doing well now on flonase qd and ipratropium nasal spray.  An After Visit Summary was printed and given to the patient.  FOLLOW UP: Return in about 2 months (around 05/25/2018) for f/u psych/const.  Signed:  Crissie Sickles, MD           03/25/2018

## 2018-04-09 ENCOUNTER — Telehealth: Payer: Self-pay

## 2018-04-09 DIAGNOSIS — M1712 Unilateral primary osteoarthritis, left knee: Secondary | ICD-10-CM

## 2018-04-09 DIAGNOSIS — M797 Fibromyalgia: Secondary | ICD-10-CM

## 2018-04-09 MED ORDER — OXYCODONE-ACETAMINOPHEN 10-325 MG PO TABS: 1.0000 | ORAL_TABLET | Freq: Four times a day (QID) | ORAL | 0 refills | Status: DC | PRN

## 2018-04-09 NOTE — Telephone Encounter (Signed)
Oxycodone e- scribe,  To accommodate schedule appointment.

## 2018-04-09 NOTE — Telephone Encounter (Signed)
Pt called wanting to cancel appt with Dr. Naaman Plummer on 4/23 for injection and reschedule with [facility] for medication management. She is running out of meds

## 2018-04-13 ENCOUNTER — Other Ambulatory Visit: Payer: Self-pay | Admitting: Family Medicine

## 2018-04-13 NOTE — Telephone Encounter (Signed)
[facility]  RF request for alprazolam LOV: 03/25/18 Next ov: 05/27/18 Last written: 10/15/17 #90 w/ 5RF  Please advise. Thanks.

## 2018-04-14 ENCOUNTER — Encounter: Payer: Medicare Other | Attending: Physical Medicine & Rehabilitation | Admitting: Registered Nurse

## 2018-04-14 ENCOUNTER — Encounter: Payer: Medicare Other | Admitting: Physical Medicine & Rehabilitation

## 2018-04-14 ENCOUNTER — Encounter: Payer: Self-pay | Admitting: Registered Nurse

## 2018-04-14 VITALS — BP 149/79 | HR 85 | Ht 64.0 in | Wt 140.4 lb

## 2018-04-14 DIAGNOSIS — M797 Fibromyalgia: Secondary | ICD-10-CM | POA: Diagnosis not present

## 2018-04-14 DIAGNOSIS — G894 Chronic pain syndrome: Secondary | ICD-10-CM

## 2018-04-14 DIAGNOSIS — M1711 Unilateral primary osteoarthritis, right knee: Secondary | ICD-10-CM | POA: Insufficient documentation

## 2018-04-14 DIAGNOSIS — M1712 Unilateral primary osteoarthritis, left knee: Secondary | ICD-10-CM | POA: Diagnosis not present

## 2018-04-14 DIAGNOSIS — M5416 Radiculopathy, lumbar region: Secondary | ICD-10-CM

## 2018-04-14 DIAGNOSIS — Z79899 Other long term (current) drug therapy: Secondary | ICD-10-CM

## 2018-04-14 DIAGNOSIS — R2689 Other abnormalities of gait and mobility: Secondary | ICD-10-CM | POA: Insufficient documentation

## 2018-04-14 DIAGNOSIS — Z5181 Encounter for therapeutic drug level monitoring: Secondary | ICD-10-CM | POA: Insufficient documentation

## 2018-04-14 NOTE — Telephone Encounter (Signed)
Rx faxed

## 2018-04-14 NOTE — Progress Notes (Signed)
Subjective:    Patient ID: Linda [name], female    DOB: 04-Feb-1946, 72 y.o.   MRN: [id number]  HPI: Linda [name] is a 72 year old female who returns for follow up  appointment for chronic pain and medication refill. She states her pain is located in her lower back and bilateral knees. She rates her pain 5. Her current exercise regime is walking.   Linda [name] Morphine Equivalent is 52.00 MME. She is also prescribe Alprazolam by [name] .We have discussed the black box warning of using opioids and benzodiazepines.I highlighted the dangers of using these drugs together and discussed the adverse events including respiratory suppression, overdose, cognitive impairment and importance of  compliance with current regimen. She verbalizes understanding, we will continue to monitor and adjust as indicated.    Last Oral Swab was Performed on 12/11/2017, it was consistent.   Pain Inventory Average Pain 5 Pain Right Now 5 My pain is .  In the last 24 hours, has pain interfered with the following? General activity 6 Relation with others 6 Enjoyment of life 5 What TIME of day is your pain at its worst? daytime Sleep (in general) Fair  Pain is worse with: walking, standing and some activites Pain improves with: rest, medication and injections Relief from Meds: 9  Mobility walk without assistance use a walker ability to climb steps?  no do you drive?  yes  Function retired  Neuro/Psych trouble walking anxiety  Prior Studies Any changes since last visit?  no  Physicians involved in your care Any changes since last visit?  no   Family History  Problem Relation Age of Onset  . Arthritis Mother   . Kidney disease Mother   . Heart disease Father   . Stroke Father   . Hypertension Father   . Diabetes Father   . Heart attack Father   . Heart attack Paternal Grandmother   . Diabetes Sister        one sister  . Hypertension Sister   . Hypertension Brother   .  Multiple sclerosis Son    Social History   Socioeconomic History  . Marital status: Widowed    Spouse name: Not on file  . Number of children: 2  . Years of education: Not on file  . Highest education level: Not on file  Occupational History  . 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 Financial resource strain: Not on file  . Food insecurity:    Worry: Not on file    Inability: Not on file  . Transportation needs:    Medical: Not on file    Non-medical: Not on file  Tobacco Use  . Smoking status: Never Smoker  . Smokeless tobacco: Never Used  . Tobacco comment: never used tobacco  Substance and Sexual Activity  . Alcohol use: No    Alcohol/week: 0.0 oz  . Drug use: No  . Sexual activity: Not Currently  Lifestyle  . Physical activity:    Days per week: Not on file    Minutes per session: Not on file  . Stress: Not on file  Relationships  . Social connections:    Talks on phone: Not on file    Gets together: Not on file    Attends religious service: Not on file    Active member of club or organization: Not on file    Attends meetings of clubs or organizations: Not on  file    Relationship status: Not on file  Other Topics Concern  . Not on file  Social History Narrative   Widowed, 2 sons.  Relocated to [place] 09/2012 to be closer to her son who has MS.   Husband d 2015--mesothelioma.   Occupation: former Pharmacist, hospital.   Education: masters degree level.   No T/A/Ds.   Lives in [place], independent living.   Past Surgical History:  Procedure Laterality Date  . APPENDECTOMY  1960  . AXILLARY SURGERY Left 1978   Multiple "lump" in armpit per pt  . CARDIAC CATHETERIZATION  01/2013   nonobstructive CAD, EF 55-60%  . CARDIOVASCULAR STRESS TEST  02/22/15   Low risk myocard perf imaging; wall motion normal, normal EF  . carotid duplex doppler  10/21/2017   R vertebral flow suggestive of possible distal obstruction.  Pt declined further w/u as  of 10/29/17 but need to revisit this problem periodically.  . COCCYX REMOVAL  1972  . DEXA  06/05/2017   T-score -3.1  . DILATION AND CURETTAGE OF UTERUS  ? 1970's  . ESOPHAGOGASTRODUODENOSCOPY N/A 07/19/2014   Gastritis found + in the setting of supratherapeutic INR, +plavix, + meloxicam.  . EYE SURGERY Left 2012-2013   "injections for ~ 1 yr; don't really know what for" (07/12/2013)  . HEEL SPUR SURGERY Left 2008  . KNEE SURGERY  2005  . LEFT HEART CATHETERIZATION WITH CORONARY ANGIOGRAM N/A 01/30/2013   Procedure: LEFT HEART CATHETERIZATION WITH CORONARY ANGIOGRAM;  Surgeon: Clent Demark, MD;  Location[facility] C[facility] LAB;  Service: Cardiovascular;  Laterality: N/A;  . PLANTAR FASCIA RELEASE Left 2008  . SPIROMETRY  04/25/14   In hosp for acute asthma/COPD flare: mixed obstructive and restrictive lung disease. The FEV1 is severely reduced at 45% predicted.  FEV1 signif decreased compared to prior spirometry 07/23/13.  . TENDON RELEASE  1996   Right forearm and hand  . TOTAL ABDOMINAL HYSTERECTOMY  1974  . TRANSTHORACIC ECHOCARDIOGRAM  01/2013; 04/2014;08/2015; 09/2017   2014--NORMAL.  2015--focal basal septal hypertrophy, EF 55-60%, grade I diast dysfxn, mild LAE.  08/2015 EF 55-60%, nl LV syst fxn, grade I DD, valves wnl. 10/21/17: EF 65-70%, grd I DD, o/w normal.   Past Medical History:  Diagnosis Date  . Acute upper GI bleed 06/2014   while pt taking coumadin, plavix, and meloxicam---despite being told not to take coumadin.  . Anginal pain ([place])    Nonobstructive CAD 2014; however, her cardiologist put her on a statin for this and NOT for hyperlipidemia per pt report.  Atyp CP 08/2017 at card f/u, plan for myoc perf imaging.  . Anxiety    panic attacks  . Asthma    w/ asbestososis   . BPPV (benign paroxysmal positional vertigo) 12/16/2012  . Chronic diastolic CHF (congestive heart failure) (HCC)    dry wt as of 11/06/16 is 168 lbs.  . Chronic lower back pain   . COPD (chronic obstructive  pulmonary disease) ([place])   . DDD (degenerative disc disease)    lumbar and cervical.   . Diverticular disease   . Fibromyalgia    Patient states dx was around her late 39s but she had sx's for years prior to this.  . H/O hiatal hernia   . Hay fever   . History of pneumonia    hospitalized 12/2011, 02/2013, and 07/2013 [facility]) for this  . HTN (hypertension)   . Idiopathic angio-edema-urticaria 72014   Angioedema component was very minimal  . Insomnia   .  Iron deficiency anemia    Hematologist in [place], [place] did extensive w/u; no cause found; failed oral supplement;; gets fairly regular (q19m or so) IV iron infusions (Venofer -iron sucrose- 200mg  with procrit.  "for 14 yr I've been getting blood work q month & getting infusions prn" (07/12/2013).  Dr. Marin Olp locally, iron infusions done, EPO deficiency dx'd  . Migraine syndrome    "not as often anymore; used to be ~ q wk" (07/12/2013)  . Mixed incontinence urge and stress   . Nephrolithiasis    "passed all on my own or they are still in there" (07/12/2013)  . OSA on CPAP    prior to move to [place]--had another sleep study 10/2015 w/pulm Dr. Camillo Flaming.  . Osteoarthritis    "severe; progressing fast" (07/12/2013); multiple joints-not surgical candidate for TKR (03/2015).  Triamcinolon knee injections by Dr. Tessa Lerner 12/2017.  [name] Kitchen Parkinsonism [facility]) 2018   Dr. Carles Collet, neuro, saw her 11/24/17 and recommended d/c of abilify as first step.   [name] Kitchen Pernicious anemia 08/24/2014  . Pleural plaque with presence of asbestos 07/22/2013  . Pulmonary embolism ([place]) 07/2013   Dx at [facility] with very small peripheral upper lobe pe 07/2013: pt took coumadin for about 8-9 mo  . Pyelonephritis    "several times over the last 30 yr" (07/12/2013)  . RBBB (right bundle branch block)   . Recurrent major depression ([place])   . Recurrent UTI    hx of hospitalization for pyelonephritis; started abx prophylaxis 06/2015  . Syncope    Hypotensive; ED visit--Dr. Terrence Dupont did Cath--nonobstructive  CAD, EF 55-60%.  In retrospect, suspect pt rx med misuse/polypharmacy  . Tension headache, chronic    There were no vitals taken for this visit.  Opioid Risk Score:   Fall Risk Score:  `1  Depression screen PHQ 2/9  Depression screen [facility] 2/9 03/11/2018 02/11/2018 01/14/2018 12/11/2017 11/17/2017 10/20/2017 09/17/2017  Decreased Interest 1 1 0 1 1 1 1   Down, Depressed, Hopeless 1 1 0 1 1 1 1   PHQ - 2 Score 2 2 0 2 2 2 2   Altered sleeping - - - - - - -  Tired, decreased energy - - - - - - -  Change in appetite - - - - - - -  Feeling bad or failure about yourself  - - - - - - -  Trouble concentrating - - - - - - -  Moving slowly or fidgety/restless - - - - - - -  Suicidal thoughts - - - - - - -  PHQ-9 Score - - - - - - -  Some recent data might be hidden     Review of Systems  Constitutional: Positive for unexpected weight change.  Eyes: Negative.   Respiratory: Positive for shortness of breath.   Cardiovascular: Negative.   Gastrointestinal: Positive for constipation and nausea.  Endocrine: Negative.   Genitourinary: Negative.   Musculoskeletal: Positive for arthralgias, back pain, joint swelling and myalgias.  Skin: Negative.   Allergic/Immunologic: Negative.   Neurological: Negative.   Hematological: Negative.   Psychiatric/Behavioral: The patient is nervous/anxious.   All other systems reviewed and are negative.      Objective:   Physical Exam  Constitutional: She is oriented to person, place, and time. She appears well-developed and well-nourished.  HENT:  Head: Normocephalic and atraumatic.  Neck: Normal range of motion. Neck supple.  Cardiovascular: Normal rate and regular rhythm.  Pulmonary/Chest: Effort normal and breath sounds normal.  Musculoskeletal:  Normal Muscle  Bulk and Muscle Testing Reveals: Upper Extremities: Full ROM and Muscle Strength 5/5 Thoracic Paraspinal Tenderness: T-3-T-6 Lumbar Paraspinal Tenderness: L-3-L-5 Lower Extremities: Full ROM  and Muscle Strength 5/5 Bilateral Lower Extremities Flexion Produces Pain into Bilateral Patella's Arises from Table slowly using walker for support Narrow Based Gait   Neurological: She is alert and oriented to person, place, and time.  Skin: Skin is warm and dry.  Psychiatric: She has a normal mood and affect.  Nursing note and vitals reviewed.         Assessment & Plan:  1. Functional deficits secondary to Gait disorder:Continue with HEP.04/14/2018 2. Chronic Back pain/ Lumbar Radiculitis/fibromyalgia /R>L Knee OA Pain Management: 04/14/2018 Refilled: Oxycodone 10/325mg one tablet every 6 hours #100 and Voltaren Gel We will continue the opioid monitoring program, this consists of regular clinic visits, examinations, urine drug screen, pill counts as well as use of [place] Controlled Substance reporting System. 3. Depression with anxiety/Grief reaction/Mood: 04/14/2018 Continue Xanax,Trazodone and Cymbalta .  4. [name] with asthma:. Albuterol prn. :Pulmonology Following. 04/14/2018 5. Bilateral Osteoarthritis of Bilateral Knees: S/PZilretta Injection on 01/14/2018 with [facility] Noted.  Dr. Juanda Bond Orthopedist.. 04/14/2018 6. Fibromyalgia: Continue Home exercise regime. 04/14/2018 7. Cervicalgia: Continue HEP as tolerated. Alternate with heat and ice therapy. Continue to monitor. 04/14/18  20 minutes of face to face patient care time was spent during this visit. All questions were encouraged and answered.  F/U in 1 month

## 2018-04-16 LAB — DRUG TOX ALC METAB W/CON, ORAL FLD: Alcohol Metabolite: NEGATIVE ng/mL (ref <25)

## 2018-04-17 LAB — DRUG TOX MONITOR 1 W/CONF, ORAL FLD
ALPRAZOLAM: 0.99 ng/mL — AB (ref <0.50)
AMPHETAMINES: NEGATIVE ng/mL (ref <10)
BARBITURATES: NEGATIVE ng/mL (ref <10)
BENZODIAZEPINES: POSITIVE ng/mL — AB (ref <0.50)
BUPRENORPHINE: NEGATIVE ng/mL (ref <0.10)
CHLORDIAZEPOXIDE: NEGATIVE ng/mL (ref <0.50)
CODEINE: NEGATIVE ng/mL (ref <2.5)
Clonazepam: NEGATIVE ng/mL (ref <0.50)
Cocaine: NEGATIVE ng/mL (ref <5.0)
Diazepam: NEGATIVE ng/mL (ref <0.50)
Dihydrocodeine: NEGATIVE ng/mL (ref <2.5)
FLUNITRAZEPAM: NEGATIVE ng/mL (ref <0.50)
Fentanyl: NEGATIVE ng/mL (ref <0.10)
Flurazepam: NEGATIVE ng/mL (ref <0.50)
HYDROMORPHONE: NEGATIVE ng/mL (ref <2.5)
Heroin Metabolite: NEGATIVE ng/mL (ref <1.0)
Hydrocodone: NEGATIVE ng/mL (ref <2.5)
LORAZEPAM: NEGATIVE ng/mL (ref <0.50)
MARIJUANA: NEGATIVE ng/mL (ref <2.5)
MDMA: NEGATIVE ng/mL (ref <10)
MEPROBAMATE: NEGATIVE ng/mL (ref <2.5)
Methadone: NEGATIVE ng/mL (ref <5.0)
Midazolam: NEGATIVE ng/mL (ref <0.50)
Morphine: NEGATIVE ng/mL (ref <2.5)
NORDIAZEPAM: NEGATIVE ng/mL (ref <0.50)
NOROXYCODONE: 17.1 ng/mL — AB (ref <2.5)
Nicotine Metabolite: NEGATIVE ng/mL (ref <5.0)
Norhydrocodone: NEGATIVE ng/mL (ref <2.5)
Opiates: POSITIVE ng/mL — AB (ref <2.5)
Oxazepam: NEGATIVE ng/mL (ref <0.50)
Oxycodone: 44.7 ng/mL — ABNORMAL HIGH (ref <2.5)
Oxymorphone: NEGATIVE ng/mL (ref <2.5)
Phencyclidine: NEGATIVE ng/mL (ref <10)
TAPENTADOL: NEGATIVE ng/mL (ref <5.0)
TRAMADOL: NEGATIVE ng/mL (ref <5.0)
Temazepam: NEGATIVE ng/mL (ref <0.50)
Triazolam: NEGATIVE ng/mL (ref <0.50)
Zolpidem: NEGATIVE ng/mL (ref <5.0)

## 2018-04-23 ENCOUNTER — Telehealth: Payer: Self-pay | Admitting: *Deleted

## 2018-04-23 NOTE — Telephone Encounter (Signed)
Oral swab drug screen was consistent for prescribed medications.  ?

## 2018-04-29 ENCOUNTER — Other Ambulatory Visit: Payer: Self-pay | Admitting: Family Medicine

## 2018-04-29 NOTE — Telephone Encounter (Signed)
[facility]  RF request for lamotrigine LOV: 03/25/18 Next ov: 05/27/18 Last written: 01/22/18 #30 w/ 2RF  Please advise. Thanks.

## 2018-05-06 ENCOUNTER — Telehealth: Payer: Self-pay | Admitting: Registered Nurse

## 2018-05-06 ENCOUNTER — Other Ambulatory Visit: Payer: Self-pay | Admitting: Cardiology

## 2018-05-06 DIAGNOSIS — M1712 Unilateral primary osteoarthritis, left knee: Secondary | ICD-10-CM

## 2018-05-06 DIAGNOSIS — M797 Fibromyalgia: Secondary | ICD-10-CM

## 2018-05-06 MED ORDER — OXYCODONE-ACETAMINOPHEN 10-325 MG PO TABS
1.0000 | ORAL_TABLET | Freq: Four times a day (QID) | ORAL | 0 refills | Status: DC | PRN
Start: 2018-05-06 — End: 2018-06-02

## 2018-05-06 NOTE — Telephone Encounter (Signed)
Oxycodone e-scribe, she wasn't given prescription on 04/14/2018. She's scheduled to see [name] on 05/20/2018.Last prescription was filled on 04/09/2018, according to the PMP Aware Web-Site.

## 2018-05-06 NOTE — Telephone Encounter (Signed)
REFILL 

## 2018-05-19 ENCOUNTER — Other Ambulatory Visit: Payer: Self-pay

## 2018-05-19 ENCOUNTER — Encounter: Payer: Medicare Other | Attending: Physical Medicine & Rehabilitation | Admitting: Physical Medicine & Rehabilitation

## 2018-05-19 ENCOUNTER — Encounter: Payer: Self-pay | Admitting: Physical Medicine & Rehabilitation

## 2018-05-19 DIAGNOSIS — M170 Bilateral primary osteoarthritis of knee: Secondary | ICD-10-CM | POA: Insufficient documentation

## 2018-05-19 DIAGNOSIS — M1712 Unilateral primary osteoarthritis, left knee: Secondary | ICD-10-CM | POA: Diagnosis not present

## 2018-05-19 DIAGNOSIS — M1711 Unilateral primary osteoarthritis, right knee: Secondary | ICD-10-CM

## 2018-05-19 DIAGNOSIS — M797 Fibromyalgia: Secondary | ICD-10-CM

## 2018-05-19 NOTE — Patient Instructions (Signed)
PLEASE FEEL FREE TO CALL OUR OFFICE WITH ANY PROBLEMS OR QUESTIONS ([phone])      

## 2018-05-19 NOTE — Progress Notes (Signed)
PROCEDURE NOTE  DIAGNOSIS:  Osteoarthritis of bilateral knees  INTERVENTION:   ZILRETTA INJECTION     After informed consent and preparation of the skin with betadine and isopropyl alcohol, I injected 32MG  of zilretta dissolved into 5cc of diluent into bilateral knees via anterolateral approach. Contents of syring were shaken vigorously and aspiration was performed prior to injection. The patient tolerated well, and no complications were encountered. Afterward the area was cleaned and dressed. Post- injection instructions were provided including ice  if swelling or pain should occur.    She will see our NP back next month.    Meredith Staggers, MD, [facility] 05/19/2018

## 2018-05-20 ENCOUNTER — Ambulatory Visit: Payer: Medicare Other | Admitting: Physical Medicine & Rehabilitation

## 2018-05-21 NOTE — Progress Notes (Signed)
Linda [name] was seen today in the movement disorders clinic for neurologic consultation at the request of McGowen, Adrian Blackwater, MD.  The consultation is for the evaluation of tremor, gait instability and to rule out Parkinson's disease. Pt states that sx's have been going on for 2 years but son thinks about 3 years, but he isn't quite sure.  It is getting worse slowly.  This patient is accompanied in the office by her son who supplements the history.  05/25/18 update:  Pt seen in follow up for parkinsonism, felt likely due to abilify.  The records that were made available to me were reviewed. This patient is accompanied in the office by her son who supplements the history. She was taken off of Abilify not long after our last visit.  She was started on Lamictal.  Pts son states "you have changed her life.  She doesn't need a walker.  People all the time ask how she got better."  Pt states that she feels great.  Mood is good.  PREVIOUS MEDICATIONS: none to date  ALLERGIES:   Allergies  Allergen Reactions  . Abilify [Aripiprazole] Other (See Comments)    parkinsonism  . Penicillins Itching, Swelling and Rash    Tolerated Cefepime in ED. Has patient had a PCN reaction causing immediate rash, facial/tongue/throat swelling, SOB or lightheadedness with hypotension: Yes Has patient had a PCN reaction causing severe rash involving mucus membranes or skin necrosis: No Has patient had a PCN reaction that required hospitalization: No  Has patient had a PCN reaction occurring within the last 10 years: No     CURRENT MEDICATIONS:  Outpatient Encounter Medications as of 05/25/2018  Medication Sig  . albuterol (PROVENTIL HFA;VENTOLIN HFA) 108 (90 BASE) MCG/ACT inhaler Inhale 2 puffs into the lungs every 6 (six) hours as needed for wheezing or shortness of breath.  [name] Kitchen alendronate (FOSAMAX) 70 MG tablet Take 1 tablet (70 mg total) by mouth every 7 (seven) days. Take with a full glass of water on an empty  stomach, first thing in the morning and remain up right for 30 minutes after taking.  [name] Kitchen ALPRAZolam (XANAX) 1 MG tablet TAKE 1 TABLET THREE TIMES DAILY AS NEEDED FOR ANXIETY.  [name] Kitchen aspirin 81 MG tablet Take 81 mg by mouth at bedtime.  . diclofenac sodium (VOLTAREN) 1 % GEL Apply 2 g topically 3 (three) times daily as needed (pain).  . DULoxetine (CYMBALTA) 30 MG capsule TAKE 1 CAPSULE ONCE DAILY ALONG WITH 60MG  CAPSULE FOR TOTAL 90MG  DAILY.  . DULoxetine (CYMBALTA) 60 MG capsule TAKE 1 CAPSULE A DAY TO BE COMBINED WITH 30MG  CAPSULE  . fluticasone (FLONASE) 50 MCG/ACT nasal spray Place 2 sprays into both nostrils daily.  . furosemide (LASIX) 40 MG tablet Take 1 tablet (40 mg total) by mouth daily. (Patient taking differently: Take 40 mg by mouth as needed for fluid. )  . ipratropium (ATROVENT) 0.03 % nasal spray Place 2 sprays into both nostrils every 12 (twelve) hours.  . isosorbide mononitrate (IMDUR) 30 MG 24 hr tablet Take 1 tablet (30 mg total) by mouth 2 (two) times daily. 60mg  in the am and 30mg  in the pm (Patient taking differently: Take 30-60 mg by mouth See admin instructions. 60mg  in the am and 30mg  in the pm)  . isosorbide mononitrate (IMDUR) 30 MG 24 hr tablet TAKE 2 TABLETS IN THE AM AND 1 TABLET IN THE PM.  . isosorbide mononitrate (IMDUR) 30 MG 24 hr tablet TAKE 2 TABLETS  IN THE AM AND 1 TABLET IN THE PM.  . lamoTRIgine (LAMICTAL) 100 MG tablet TAKE 1 TABLET EACH DAY.  . metoprolol succinate (TOPROL-XL) 25 MG 24 hr tablet Take 1 tablet by mouth daily.  . Multiple Vitamin (MULTIVITAMIN WITH MINERALS) TABS tablet Take 1 tablet by mouth daily.  Melynda Ripple Tosylate (SYMPROIC) 0.2 MG TABS Take 1 tablet by mouth daily.  . nitroGLYCERIN (NITROSTAT) 0.4 MG SL tablet Place 1 tablet (0.4 mg total) under the tongue every 5 (five) minutes as needed for chest pain (x 3 doses).  [name] Kitchen oxyCODONE-acetaminophen (PERCOCET) 10-325 MG tablet Take 1 tablet by mouth every 6 (six) hours as needed for pain.  .  OXYGEN Inhale 3 L into the lungs continuous.  . pantoprazole (PROTONIX) 40 MG tablet TAKE 1 TABLET BY MOUTH DAILY.  . traZODone (DESYREL) 50 MG tablet TAKE 2-4 TABLETS AT BEDTIME  . Vitamin D, Ergocalciferol, (DRISDOL) 50000 units CAPS capsule 1 tab po TWICE per week   Facility-Administered Encounter Medications as of 05/25/2018  Medication  . ferumoxytol (FERAHEME) 510 mg in sodium chloride 0.9 % 100 mL IVPB    PAST MEDICAL HISTORY:   Past Medical History:  Diagnosis Date  . Acute upper GI bleed 06/2014   while pt taking coumadin, plavix, and meloxicam---despite being told not to take coumadin.  . Anginal pain ([place])    Nonobstructive CAD 2014; however, her cardiologist put her on a statin for this and NOT for hyperlipidemia per pt report.  Atyp CP 08/2017 at card f/u, plan for myoc perf imaging.  . Anxiety    panic attacks  . Asthma    w/ asbestososis   . BPPV (benign paroxysmal positional vertigo) 12/16/2012  . Chronic diastolic CHF (congestive heart failure) (HCC)    dry wt as of 11/06/16 is 168 lbs.  . Chronic lower back pain   . COPD (chronic obstructive pulmonary disease) ([place])   . DDD (degenerative disc disease)    lumbar and cervical.   . Diverticular disease   . Fibromyalgia    Patient states dx was around her late 20s but she had sx's for years prior to this.  . H/O hiatal hernia   . Hay fever   . History of pneumonia    hospitalized 12/2011, 02/2013, and 07/2013 [facility]) for this  . HTN (hypertension)   . Idiopathic angio-edema-urticaria 72014   Angioedema component was very minimal  . Insomnia   . Iron deficiency anemia    Hematologist in [place], [place] did extensive w/u; no cause found; failed oral supplement;; gets fairly regular (q56m or so) IV iron infusions (Venofer -iron sucrose- 200mg  with procrit.  "for 14 yr I've been getting blood work q month & getting infusions prn" (07/12/2013).  Dr. Marin Olp locally, iron infusions done, EPO deficiency dx'd  . Migraine syndrome     "not as often anymore; used to be ~ q wk" (07/12/2013)  . Mixed incontinence urge and stress   . Nephrolithiasis    "passed all on my own or they are still in there" (07/12/2013)  . OSA on CPAP    prior to move to [place]--had another sleep study 10/2015 w/pulm Dr. Camillo Flaming.  . Osteoarthritis    "severe; progressing fast" (07/12/2013); multiple joints-not surgical candidate for TKR (03/2015).  Triamcinolon knee injections by Dr. Tessa Lerner 12/2017.  [name] Kitchen Parkinsonism [facility]) 2018   Dr. Carles Collet, neuro, saw her 11/24/17 and recommended d/c of abilify as first step.   [name] Kitchen Pernicious anemia 08/24/2014  . Pleural plaque  with presence of asbestos 07/22/2013  . Pulmonary embolism ([place]) 07/2013   Dx at [facility] with very small peripheral upper lobe pe 07/2013: pt took coumadin for about 8-9 mo  . Pyelonephritis    "several times over the last 30 yr" (07/12/2013)  . RBBB (right bundle branch block)   . Recurrent major depression ([place])   . Recurrent UTI    hx of hospitalization for pyelonephritis; started abx prophylaxis 06/2015  . Syncope    Hypotensive; ED visit--Dr. Terrence Dupont did Cath--nonobstructive CAD, EF 55-60%.  In retrospect, suspect pt rx med misuse/polypharmacy  . Tension headache, chronic     PAST SURGICAL HISTORY:   Past Surgical History:  Procedure Laterality Date  . APPENDECTOMY  1960  . AXILLARY SURGERY Left 1978   Multiple "lump" in armpit per pt  . CARDIAC CATHETERIZATION  01/2013   nonobstructive CAD, EF 55-60%  . CARDIOVASCULAR STRESS TEST  02/22/15   Low risk myocard perf imaging; wall motion normal, normal EF  . carotid duplex doppler  10/21/2017   R vertebral flow suggestive of possible distal obstruction.  Pt declined further w/u as of 10/29/17 but need to revisit this problem periodically.  . COCCYX REMOVAL  1972  . DEXA  06/05/2017   T-score -3.1  . DILATION AND CURETTAGE OF UTERUS  ? 1970's  . ESOPHAGOGASTRODUODENOSCOPY N/A 07/19/2014   Gastritis found + in the setting of supratherapeutic INR,  +plavix, + meloxicam.  . EYE SURGERY Left 2012-2013   "injections for ~ 1 yr; don't really know what for" (07/12/2013)  . HEEL SPUR SURGERY Left 2008  . KNEE SURGERY  2005  . LEFT HEART CATHETERIZATION WITH CORONARY ANGIOGRAM N/A 01/30/2013   Procedure: LEFT HEART CATHETERIZATION WITH CORONARY ANGIOGRAM;  Surgeon: Clent Demark, MD;  Location: [facility] CATH LAB;  Service: Cardiovascular;  Laterality: N/A;  . PLANTAR FASCIA RELEASE Left 2008  . SPIROMETRY  04/25/14   In hosp for acute asthma/COPD flare: mixed obstructive and restrictive lung disease. The FEV1 is severely reduced at 45% predicted.  FEV1 signif decreased compared to prior spirometry 07/23/13.  . TENDON RELEASE  1996   Right forearm and hand  . TOTAL ABDOMINAL HYSTERECTOMY  1974  . TRANSTHORACIC ECHOCARDIOGRAM  01/2013; 04/2014;08/2015; 09/2017   2014--NORMAL.  2015--focal basal septal hypertrophy, EF 55-60%, grade I diast dysfxn, mild LAE.  08/2015 EF 55-60%, nl LV syst fxn, grade I DD, valves wnl. 10/21/17: EF 65-70%, grd I DD, o/w normal.    SOCIAL HISTORY:   Social History   Socioeconomic History  . Marital status: Widowed    Spouse name: Not on file  . Number of children: 2  . Years of education: Not on file  . Highest education level: Not on file  Occupational History  . 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 Financial resource strain: Not on file  . Food insecurity:    Worry: Not on file    Inability: Not on file  . Transportation needs:    Medical: Not on file    Non-medical: Not on file  Tobacco Use  . Smoking status: Never Smoker  . Smokeless tobacco: Never Used  . Tobacco comment: never used tobacco  Substance and Sexual Activity  . Alcohol use: No    Alcohol/week: 0.0 oz  . Drug use: No  . Sexual activity: Not Currently  Lifestyle  . Physical activity:    Days per week: Not on file    Minutes  per session: Not on file  . Stress: Not on file  Relationships  . Social  connections:    Talks on phone: Not on file    Gets together: Not on file    Attends religious service: Not on file    Active member of club or organization: Not on file    Attends meetings of clubs or organizations: Not on file    Relationship status: Not on file  . Intimate partner violence:    Fear of current or ex partner: Not on file    Emotionally abused: Not on file    Physically abused: Not on file    Forced sexual activity: Not on file  Other Topics Concern  . Not on file  Social History Narrative   Widowed, 2 sons.  Relocated to [place] 09/2012 to be closer to her son who has MS.   Husband d 2015--mesothelioma.   Occupation: former Pharmacist, hospital.   Education: masters degree level.   No T/A/Ds.   Lives in [place], independent living.    FAMILY HISTORY:   Family Status  Relation Name Status  . Mother  Deceased  . Father  Deceased  . PGM  (Not Specified)  . Sister x3 Alive  . Brother Social research officer, government  . Daughter  Alive  . Son  Alive    ROS:  10 system review neg  PHYSICAL EXAMINATION:    VITALS:   Vitals:   05/25/18 1346  BP: 112/64  Pulse: 85  SpO2: 95%  Weight: 139 lb (63 kg)  Height: 5\' 4"  (1.626 m)   GEN:  The patient appears stated age and is in NAD. HEENT:  Normocephalic, atraumatic.  The mucous membranes are moist. The superficial temporal arteries are without ropiness or tenderness. Tongue is moving about in the mouth and when distracted with other tasks, will protrude from the mouth. CV:  RRR Lungs:  CTAB Neck/HEME:  There are no carotid bruits bilaterally.  Neurological examination:  Orientation: The patient is alert and oriented x3. Cranial nerves: There is good facial symmetry. The speech is fluent and clear. Soft palate rises symmetrically and there is no tongue deviation. Hearing is intact to conversational tone. Sensation: Sensation is intact to light touch throughout Motor: Strength is 5/5 in the bilateral upper and lower extremities.   Shoulder  shrug is equal and symmetric.  There is no pronator drift.  Movement examination: Tone: There is normal tone in the RUE.  There is normal tone in the LUE.  There is normal tone in the RLE.  There is normal tone in the LLE.  Abnormal movements: None Coordination:  There is no decremation with RAM's, with any form of RAMS, including alternating supination and pronation of the forearm, hand opening and closing, finger taps, heel taps and toe taps. Gait and Station: The patient has no difficulty arising out of a deep-seated chair without the use of the hands. The patient's stride length is normal with good arm swing.     Labs:    Chemistry      Component Value Date/Time   NA 137 01/05/2018 0633   NA 141 06/05/2015 1315   K 3.4 (L) 01/05/2018 0633   K 3.9 06/05/2015 1315   CL 98 (L) 01/05/2018 0633   CL 103 06/05/2015 1315   CO2 28 01/05/2018 0633   CO2 28 06/05/2015 1315   BUN 19 01/05/2018 0633   BUN 14 06/05/2015 1315   CREATININE 0.60 01/05/2018 0633   CREATININE 0.87  02/03/2017 1523      Component Value Date/Time   CALCIUM 8.8 (L) 01/05/2018 0633   CALCIUM 9.0 06/05/2015 1315   ALKPHOS 58 01/01/2018 1052   ALKPHOS 84 06/05/2015 1315   AST 29 01/01/2018 1052   AST 23 06/05/2015 1315   ALT 16 01/01/2018 1052   ALT 17 06/05/2015 1315   BILITOT 1.2 01/01/2018 1052   BILITOT 1.00 06/05/2015 1315     Lab Results  Component Value Date   TSH 0.745 10/21/2017   Lab Results  Component Value Date   VITAMINB12 349 01/02/2016     ASSESSMENT/PLAN:  1.  Parkinsonism  -This is now resolved off of Abilify.  The patient looks markedly better and examination is markedly improved.  She is very happy.  2.  Tardive dyskinesia  -Patient does have oral buccal lingual dyskinesia, likely as a long-term side effect of Abilify.  She thought that this was from her dentures or dry mouth.  Talked to her about the fact that this symptom can be permanent.  Talked to her about Ingrezza/Austedo.   She will think about these meds and talk to her PCP about them.  For now, she is happy and TD isn't that bothersome to her.  3.  F/u prn  Cc:  McGowen, Adrian Blackwater, MD

## 2018-05-25 ENCOUNTER — Encounter: Payer: Self-pay | Admitting: Neurology

## 2018-05-25 ENCOUNTER — Other Ambulatory Visit: Payer: Self-pay

## 2018-05-25 ENCOUNTER — Ambulatory Visit (INDEPENDENT_AMBULATORY_CARE_PROVIDER_SITE_OTHER): Payer: Medicare Other | Admitting: Neurology

## 2018-05-25 VITALS — BP 112/64 | HR 85 | Ht 64.0 in | Wt 139.0 lb

## 2018-05-25 DIAGNOSIS — G2401 Drug induced subacute dyskinesia: Secondary | ICD-10-CM

## 2018-05-25 DIAGNOSIS — G2111 Neuroleptic induced parkinsonism: Secondary | ICD-10-CM | POA: Diagnosis not present

## 2018-05-25 DIAGNOSIS — T43505A Adverse effect of unspecified antipsychotics and neuroleptics, initial encounter: Secondary | ICD-10-CM

## 2018-05-27 ENCOUNTER — Encounter: Payer: Self-pay | Admitting: Family Medicine

## 2018-05-27 ENCOUNTER — Ambulatory Visit (INDEPENDENT_AMBULATORY_CARE_PROVIDER_SITE_OTHER): Payer: Medicare Other | Admitting: Family Medicine

## 2018-05-27 VITALS — BP 129/76 | HR 77 | Temp 98.1°F | Resp 16 | Ht 64.0 in | Wt 140.0 lb

## 2018-05-27 DIAGNOSIS — E559 Vitamin D deficiency, unspecified: Secondary | ICD-10-CM

## 2018-05-27 DIAGNOSIS — K5909 Other constipation: Secondary | ICD-10-CM | POA: Diagnosis not present

## 2018-05-27 DIAGNOSIS — M7989 Other specified soft tissue disorders: Secondary | ICD-10-CM

## 2018-05-27 DIAGNOSIS — G2401 Drug induced subacute dyskinesia: Secondary | ICD-10-CM

## 2018-05-27 DIAGNOSIS — F3341 Major depressive disorder, recurrent, in partial remission: Secondary | ICD-10-CM | POA: Diagnosis not present

## 2018-05-27 DIAGNOSIS — Z79899 Other long term (current) drug therapy: Secondary | ICD-10-CM

## 2018-05-27 DIAGNOSIS — G2111 Neuroleptic induced parkinsonism: Secondary | ICD-10-CM

## 2018-05-27 MED ORDER — ALBUTEROL SULFATE HFA 108 (90 BASE) MCG/ACT IN AERS: 2.0000 | INHALATION_SPRAY | Freq: Four times a day (QID) | RESPIRATORY_TRACT | 1 refills | Status: DC | PRN

## 2018-05-27 MED ORDER — VITAMIN D (ERGOCALCIFEROL) 1.25 MG (50000 UNIT) PO CAPS: ORAL_CAPSULE | ORAL | 3 refills | Status: DC

## 2018-05-27 MED ORDER — LACTULOSE 10 GM/15ML PO SOLN: 10.0000 g | Freq: Every day | ORAL | 5 refills | Status: DC

## 2018-05-27 NOTE — Progress Notes (Signed)
OFFICE VISIT  05/27/2018   CC:  Chief Complaint  Patient presents with  . Follow-up    psych/const   HPI:    Patient is a 72 y.o. Caucasian female who presents for 2 mo f/u neuroleptic induced parkinsonism, Mood d/o, GAD, and opioid induced constipation.  1) Neuroleptic induced parkinsonism: recently had f/u with neuro 05/25/18-->doing great since off abilify but +tardive dyskinesia dx'd.  Handwriting not back to normal yet: bigger but not her normal.  Also not neat like before. Still with lipsmacking, mild--she cannot make it stop.  2) Constip:Last visit: D/C amitiza, start symproic 0.2mg  qd.  Taking symproic once a day and has a BM once a week. Takes stool softener.  BM is soft and not difficult to pass and she feels like a full evacuation of colon does occur. Miralax and senna lost efficacy for her in the recent past so she stopped them.   3) Mood/anxiety:good, stable, only bad days are when she gets lonely. Has her interest in reading again -->first time this has been present since her husband died 4 yrs ago. Socializes more.  She is using a bit less pain medication.  Starting to get knee injections again and feels like this is helping.  Walking with her walking club.  4) Vitamin D def: has not been on this for 3 mo b/c ran out and didn't request RF.  ROS: still using 3L oxygen prn ambulation/exertion.  Past Medical History:  Diagnosis Date  . Acute upper GI bleed 06/2014   while pt taking coumadin, plavix, and meloxicam---despite being told not to take coumadin.  . Anginal pain ([place])    Nonobstructive CAD 2014; however, her cardiologist put her on a statin for this and NOT for hyperlipidemia per pt report.  Atyp CP 08/2017 at card f/u, plan for myoc perf imaging.  . Anxiety    panic attacks  . Asthma    w/ asbestososis   . BPPV (benign paroxysmal positional vertigo) 12/16/2012  . Chronic diastolic CHF (congestive heart failure) (HCC)    dry wt as of 11/06/16 is 168 lbs.  .  Chronic lower back pain   . COPD (chronic obstructive pulmonary disease) ([place])   . DDD (degenerative disc disease)    lumbar and cervical.   . Diverticular disease   . Fibromyalgia    Patient states dx was around her late 55s but she had sx's for years prior to this.  . H/O hiatal hernia   . Hay fever   . History of pneumonia    hospitalized 12/2011, 02/2013, and 07/2013 [facility]) for this  . HTN (hypertension)   . Idiopathic angio-edema-urticaria 72014   Angioedema component was very minimal  . Insomnia   . Iron deficiency anemia    Hematologist in [place], [place] did extensive w/u; no cause found; failed oral supplement;; gets fairly regular (q76m or so) IV iron infusions (Venofer -iron sucrose- 200mg  with procrit.  "for 14 yr I've been getting blood work q month & getting infusions prn" (07/12/2013).  Dr. Marin Olp locally, iron infusions done, EPO deficiency dx'd  . Migraine syndrome    "not as often anymore; used to be ~ q wk" (07/12/2013)  . Mixed incontinence urge and stress   . Nephrolithiasis    "passed all on my own or they are still in there" (07/12/2013)  . OSA on CPAP    prior to move to [place]--had another sleep study 10/2015 w/pulm Dr. Camillo Flaming.  . Osteoarthritis    "severe; progressing  fast" (07/12/2013); multiple joints-not surgical candidate for TKR (03/2015).  Triamcinolon knee injections by Dr. Tessa Lerner 12/2017.  [name] Kitchen Parkinsonism [facility]) 2018   Dr. Carles Collet, neuro, saw her 11/24/17 and recommended d/c of abilify as first step.   [name] Kitchen Pernicious anemia 08/24/2014  . Pleural plaque with presence of asbestos 07/22/2013  . Pulmonary embolism ([place]) 07/2013   Dx at [facility] with very small peripheral upper lobe pe 07/2013: pt took coumadin for about 8-9 mo  . Pyelonephritis    "several times over the last 30 yr" (07/12/2013)  . RBBB (right bundle branch block)   . Recurrent major depression ([place])   . Recurrent UTI    hx of hospitalization for pyelonephritis; started abx prophylaxis 06/2015  . Syncope     Hypotensive; ED visit--Dr. Terrence Dupont did Cath--nonobstructive CAD, EF 55-60%.  In retrospect, suspect pt rx med misuse/polypharmacy  . Tension headache, chronic     Past Surgical History:  Procedure Laterality Date  . APPENDECTOMY  1960  . AXILLARY SURGERY Left 1978   Multiple "lump" in armpit per pt  . CARDIAC CATHETERIZATION  01/2013   nonobstructive CAD, EF 55-60%  . CARDIOVASCULAR STRESS TEST  02/22/15   Low risk myocard perf imaging; wall motion normal, normal EF  . carotid duplex doppler  10/21/2017   R vertebral flow suggestive of possible distal obstruction.  Pt declined further w/u as of 10/29/17 but need to revisit this problem periodically.  . COCCYX REMOVAL  1972  . DEXA  06/05/2017   T-score -3.1  . DILATION AND CURETTAGE OF UTERUS  ? 1970's  . ESOPHAGOGASTRODUODENOSCOPY N/A 07/19/2014   Gastritis found + in the setting of supratherapeutic INR, +plavix, + meloxicam.  . EYE SURGERY Left 2012-2013   "injections for ~ 1 yr; don't really know what for" (07/12/2013)  . HEEL SPUR SURGERY Left 2008  . KNEE SURGERY  2005  . LEFT HEART CATHETERIZATION WITH CORONARY ANGIOGRAM N/A 01/30/2013   Procedure: LEFT HEART CATHETERIZATION WITH CORONARY ANGIOGRAM;  Surgeon: Clent Demark, MD;  Location: [facility] CATH LAB;  Service: Cardiovascular;  Laterality: N/A;  . PLANTAR FASCIA RELEASE Left 2008  . SPIROMETRY  04/25/14   In hosp for acute asthma/COPD flare: mixed obstructive and restrictive lung disease. The FEV1 is severely reduced at 45% predicted.  FEV1 signif decreased compared to prior spirometry 07/23/13.  . TENDON RELEASE  1996   Right forearm and hand  . TOTAL ABDOMINAL HYSTERECTOMY  1974  . TRANSTHORACIC ECHOCARDIOGRAM  01/2013; 04/2014;08/2015; 09/2017   2014--NORMAL.  2015--focal basal septal hypertrophy, EF 55-60%, grade I diast dysfxn, mild LAE.  08/2015 EF 55-60%, nl LV syst fxn, grade I DD, valves wnl. 10/21/17: EF 65-70%, grd I DD, o/w normal.    Outpatient Medications Prior to Visit   Medication Sig Dispense Refill  . alendronate (FOSAMAX) 70 MG tablet Take 1 tablet (70 mg total) by mouth every 7 (seven) days. Take with a full glass of water on an empty stomach, first thing in the morning and remain up right for 30 minutes after taking. 12 tablet 3  . ALPRAZolam (XANAX) 1 MG tablet TAKE 1 TABLET THREE TIMES DAILY AS NEEDED FOR ANXIETY. 90 tablet 5  . aspirin 81 MG tablet Take 81 mg by mouth at bedtime.    . diclofenac sodium (VOLTAREN) 1 % GEL Apply 2 g topically 3 (three) times daily as needed (pain). 2 Tube 2  . DULoxetine (CYMBALTA) 30 MG capsule TAKE 1 CAPSULE ONCE DAILY ALONG WITH 60MG  CAPSULE FOR  TOTAL 90MG  DAILY. 30 capsule 5  . DULoxetine (CYMBALTA) 60 MG capsule TAKE 1 CAPSULE A DAY TO BE COMBINED WITH 30MG  CAPSULE 90 capsule 1  . fluticasone (FLONASE) 50 MCG/ACT nasal spray Place 2 sprays into both nostrils daily. 16 g 6  . furosemide (LASIX) 40 MG tablet Take 1 tablet (40 mg total) by mouth daily. (Patient taking differently: Take 40 mg by mouth as needed for fluid. ) 60 tablet 11  . ipratropium (ATROVENT) 0.03 % nasal spray Place 2 sprays into both nostrils every 12 (twelve) hours. 30 mL 12  . isosorbide mononitrate (IMDUR) 30 MG 24 hr tablet TAKE 2 TABLETS IN THE AM AND 1 TABLET IN THE PM. 90 tablet 2  . lamoTRIgine (LAMICTAL) 100 MG tablet TAKE 1 TABLET EACH DAY. 90 tablet 3  . metoprolol succinate (TOPROL-XL) 25 MG 24 hr tablet Take 1 tablet by mouth daily.    . Multiple Vitamin (MULTIVITAMIN WITH MINERALS) TABS tablet Take 1 tablet by mouth daily.    . nitroGLYCERIN (NITROSTAT) 0.4 MG SL tablet Place 1 tablet (0.4 mg total) under the tongue every 5 (five) minutes as needed for chest pain (x 3 doses). 25 tablet 3  . oxyCODONE-acetaminophen (PERCOCET) 10-325 MG tablet Take 1 tablet by mouth every 6 (six) hours as needed for pain. 100 tablet 0  . OXYGEN Inhale 3 L into the lungs continuous.    . pantoprazole (PROTONIX) 40 MG tablet TAKE 1 TABLET BY MOUTH DAILY. 90  tablet 1  . traZODone (DESYREL) 50 MG tablet TAKE 2-4 TABLETS AT BEDTIME 120 tablet 6  . albuterol (PROVENTIL HFA;VENTOLIN HFA) 108 (90 BASE) MCG/ACT inhaler Inhale 2 puffs into the lungs every 6 (six) hours as needed for wheezing or shortness of breath. 1 Inhaler 1  . Vitamin D, Ergocalciferol, (DRISDOL) 50000 units CAPS capsule 1 tab po TWICE per week 24 capsule 3  . isosorbide mononitrate (IMDUR) 30 MG 24 hr tablet Take 1 tablet (30 mg total) by mouth 2 (two) times daily. 60mg  in the am and 30mg  in the pm (Patient not taking: Reported on 05/27/2018) 90 tablet 3  . isosorbide mononitrate (IMDUR) 30 MG 24 hr tablet TAKE 2 TABLETS IN THE AM AND 1 TABLET IN THE PM. (Patient not taking: Reported on 05/27/2018) 90 tablet 0  . Naldemedine Tosylate (SYMPROIC) 0.2 MG TABS Take 1 tablet by mouth daily. (Patient not taking: Reported on 05/27/2018) 30 tablet 6   Facility-Administered Medications Prior to Visit  Medication Dose Route Frequency Provider Last Rate Last Dose  . ferumoxytol (FERAHEME) 510 mg in sodium chloride 0.9 % 100 mL IVPB  510 mg Intravenous Once Volanda Napoleon, MD        Allergies  Allergen Reactions  . Abilify [Aripiprazole] Other (See Comments)    parkinsonism  . Penicillins Itching, Swelling and Rash    Tolerated Cefepime in ED. Has patient had a PCN reaction causing immediate rash, facial/tongue/throat swelling, SOB or lightheadedness with hypotension: Yes Has patient had a PCN reaction causing severe rash involving mucus membranes or skin necrosis: No Has patient had a PCN reaction that required hospitalization: No  Has patient had a PCN reaction occurring within the last 10 years: No     ROS As per HPI  PE: Blood pressure 129/76, pulse 77, temperature 98.1 F (36.7 C), temperature source Oral, resp. rate 16, height 5\' 4"  (1.626 m), weight 140 lb (63.5 kg), SpO2 96 %. Body mass index is 24.03 kg/m.  Gen: Alert, well appearing.  Patient is oriented to person, place, time,  and situation. AFFECT: pleasant, lucid thought and speech. Bright affect--has very mild licking of lips, brief opening of lips/mouth.  LABS:   Vit D 05/28/17= 23 (Vit D 50K increased to twice a week at that time).  Lab Results  Component Value Date   TSH 0.745 10/21/2017   Lab Results  Component Value Date   WBC 7.7 01/05/2018   HGB 13.9 01/05/2018   HCT 42.1 01/05/2018   MCV 88.3 01/05/2018   PLT 182 01/05/2018   Lab Results  Component Value Date   CREATININE 0.60 01/05/2018   BUN 19 01/05/2018   NA 137 01/05/2018   K 3.4 (L) 01/05/2018   CL 98 (L) 01/05/2018   CO2 28 01/05/2018   Lab Results  Component Value Date   ALT 16 01/01/2018   AST 29 01/01/2018   ALKPHOS 58 01/01/2018   BILITOT 1.2 01/01/2018   Lab Results  Component Value Date   CHOL 152 01/31/2013   Lab Results  Component Value Date   HDL 44 01/31/2013   Lab Results  Component Value Date   LDLCALC 90 01/31/2013   Lab Results  Component Value Date   TRIG 92 01/31/2013   Lab Results  Component Value Date   CHOLHDL 3.5 01/31/2013   Lab Results  Component Value Date   HGBA1C 5.6 01/02/2016    IMPRESSION AND PLAN:  1) Neuroleptic induced parkinsonism (abilify): resolved.  Some tardive dyskinesia remains and it does bother pt some and some people comment on this to her.  I want to avoid any trial of ingrezza or austedo b/c of risk of worsening depression on these meds.  Watchful waiting for now.  2) Mood disorder--treatment resistant depression.  GAD. Doing very well.  No change in med regimen at this time.  3) Constipation, opioid induced: some improvement with symproic, but pt still uncomfortable with having 1 BM q week. Add lactulose 10g qd.  4) Vit D def, not taking this med x 82mo;  will recheck vit D level at next f/u in 37mo after she has been back on this med. New rx sent in today.  Will check BMET at that time as well.  An After Visit Summary was printed and given to the  patient.  FOLLOW UP: Return in about 3 months (around 08/27/2018) for routine chronic illness f/u (30 min).  Signed:  Crissie Sickles, MD           05/27/2018

## 2018-06-02 ENCOUNTER — Encounter: Payer: Self-pay | Admitting: Registered Nurse

## 2018-06-02 ENCOUNTER — Encounter: Payer: Medicare Other | Attending: Physical Medicine & Rehabilitation | Admitting: Registered Nurse

## 2018-06-02 VITALS — BP 131/77 | HR 72 | Ht 64.0 in | Wt 142.0 lb

## 2018-06-02 DIAGNOSIS — M5416 Radiculopathy, lumbar region: Secondary | ICD-10-CM

## 2018-06-02 DIAGNOSIS — R2689 Other abnormalities of gait and mobility: Secondary | ICD-10-CM | POA: Diagnosis not present

## 2018-06-02 DIAGNOSIS — M542 Cervicalgia: Secondary | ICD-10-CM

## 2018-06-02 DIAGNOSIS — Z79899 Other long term (current) drug therapy: Secondary | ICD-10-CM | POA: Insufficient documentation

## 2018-06-02 DIAGNOSIS — M1712 Unilateral primary osteoarthritis, left knee: Secondary | ICD-10-CM | POA: Diagnosis not present

## 2018-06-02 DIAGNOSIS — M797 Fibromyalgia: Secondary | ICD-10-CM | POA: Insufficient documentation

## 2018-06-02 DIAGNOSIS — Z5181 Encounter for therapeutic drug level monitoring: Secondary | ICD-10-CM | POA: Insufficient documentation

## 2018-06-02 DIAGNOSIS — G894 Chronic pain syndrome: Secondary | ICD-10-CM

## 2018-06-02 DIAGNOSIS — M1711 Unilateral primary osteoarthritis, right knee: Secondary | ICD-10-CM

## 2018-06-02 DIAGNOSIS — F329 Major depressive disorder, single episode, unspecified: Secondary | ICD-10-CM | POA: Diagnosis not present

## 2018-06-02 DIAGNOSIS — F411 Generalized anxiety disorder: Secondary | ICD-10-CM | POA: Diagnosis not present

## 2018-06-02 MED ORDER — OXYCODONE-ACETAMINOPHEN 10-325 MG PO TABS: 1.0000 | ORAL_TABLET | Freq: Four times a day (QID) | ORAL | 0 refills | Status: DC | PRN

## 2018-06-02 NOTE — Progress Notes (Signed)
Subjective:    Patient ID: Linda [name], female    DOB: [birth year], 72 y.o.   MRN: [id number]  HPI: [name] is a 72 year old female who returns for follow up appointment for chronic pain and medication refill. She states her pain is located in her lower back radiating into her bilateral lower extremities and bilateral knees.  She rates her pain 6. Her current exercise regime is walking.   Linda [name] is 60.00 MME. She is also prescribed Alprazolam by Dr. Anitra [name].We have discussed the black box warning of using opioids and benzodiazepines. I highlighted the dangers of using these drugs together and discussed the adverse events including respiratory suppression, overdose, cognitive impairment and importance of compliance with current regimen. We will continue to monitor and adjust as indicated.   Pain Inventory Average Pain 5 Pain Right Now 6 My pain is constant, sharp, burning and aching  In the last 24 hours, has pain interfered with the following? General activity 7 Relation with others 6 Enjoyment of life 5 What TIME of day is your pain at its worst? daytime Sleep (in general) Fair  Pain is worse with: walking, bending, standing and some activites Pain improves with: medication and injections Relief from Meds: 8  Mobility walk without assistance use a walker ability to climb steps?  no do you drive?  yes  Function retired  Neuro/Psych bladder control problems weakness trouble walking spasms anxiety  Prior Studies Any changes since last visit?  no  Physicians involved in your care Any changes since last visit?  no   Family History  Problem Relation Age of Onset  . Arthritis Mother   . Kidney disease Mother   . Heart disease Father   . Stroke Father   . Hypertension Father   . Diabetes Father   . Heart attack Father   . Heart attack Paternal Grandmother   . Diabetes Sister        one sister  . Hypertension Sister   .  Hypertension Brother   . Multiple sclerosis Son    Social History   Socioeconomic History  . Marital status: Widowed    Spouse name: Not on file  . Number of children: 2  . Years of education: Not on file  . Highest education level: Not on file  Occupational History  . 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 Financial resource strain: Not on file  . Food insecurity:    Worry: Not on file    Inability: Not on file  . Transportation needs:    Medical: Not on file    Non-medical: Not on file  Tobacco Use  . Smoking status: Never Smoker  . Smokeless tobacco: Never Used  . Tobacco comment: never used tobacco  Substance and Sexual Activity  . Alcohol use: No    Alcohol/week: 0.0 oz  . Drug use: No  . Sexual activity: Not Currently  Lifestyle  . Physical activity:    Days per week: Not on file    Minutes per session: Not on file  . Stress: Not on file  Relationships  . Social connections:    Talks on phone: Not on file    Gets together: Not on file    Attends religious service: Not on file    Active member of club or organization: Not on file    Attends meetings of clubs or organizations: Not on file  Relationship status: Not on file  Other Topics Concern  . Not on file  Social History Narrative   Widowed, 2 sons.  Relocated to [place] 09/2012 to be closer to her son who has MS.   Husband d 2015--mesothelioma.   Occupation: former Pharmacist, hospital.   Education: masters degree level.   No T/A/Ds.   Lives in [place], independent living.   Past Surgical History:  Procedure Laterality Date  . APPENDECTOMY  1960  . AXILLARY SURGERY Left 1978   Multiple "lump" in armpit per pt  . CARDIAC CATHETERIZATION  01/2013   nonobstructive CAD, EF 55-60%  . CARDIOVASCULAR STRESS TEST  02/22/15   Low risk myocard perf imaging; wall motion normal, normal EF  . carotid duplex doppler  10/21/2017   R vertebral flow suggestive of possible distal obstruction.   Pt declined further w/u as of 10/29/17 but need to revisit this problem periodically.  . COCCYX REMOVAL  1972  . DEXA  06/05/2017   T-score -3.1  . DILATION AND CURETTAGE OF UTERUS  ? 1970's  . ESOPHAGOGASTRODUODENOSCOPY N/A 07/19/2014   Gastritis found + in the setting of supratherapeutic INR, +plavix, + meloxicam.  . EYE SURGERY Left 2012-2013   "injections for ~ 1 yr; don't really know what for" (07/12/2013)  . HEEL SPUR SURGERY Left 2008  . KNEE SURGERY  2005  . LEFT HEART CATHETERIZATION WITH CORONARY ANGIOGRAM N/A 01/30/2013   Procedure: LEFT HEART CATHETERIZATION WITH CORONARY ANGIOGRAM;  Surgeon: Clent Demark, MD;  Location[facility] CATH LAB;  Service: Cardiovascular;  Laterality: N/A;  . PLANTAR FASCIA RELEASE Left 2008  . SPIROMETRY  04/25/14   In hosp for acute asthma/COPD flare: mixed obstructive and restrictive lung disease. The FEV1 is severely reduced at 45% predicted.  FEV1 signif decreased compared to prior spirometry 07/23/13.  . TENDON RELEASE  1996   Right forearm and hand  . TOTAL ABDOMINAL HYSTERECTOMY  1974  . TRANSTHORACIC ECHOCARDIOGRAM  01/2013; 04/2014;08/2015; 09/2017   2014--NORMAL.  2015--focal basal septal hypertrophy, EF 55-60%, grade I diast dysfxn, mild LAE.  08/2015 EF 55-60%, nl LV syst fxn, grade I DD, valves wnl. 10/21/17: EF 65-70%, grd I DD, o/w normal.   Past Medical History:  Diagnosis Date  . Acute upper GI bleed 06/2014   while pt taking coumadin, plavix, and meloxicam---despite being told not to take coumadin.  . Anginal pain ([place])    Nonobstructive CAD 2014; however, her cardiologist put her on a statin for this and NOT for hyperlipidemia per pt report.  Atyp CP 08/2017 at card f/u, plan for myoc perf imaging.  . Anxiety    panic attacks  . Asthma    w/ asbestososis   . BPPV (benign paroxysmal positional vertigo) 12/16/2012  . Chronic diastolic CHF (congestive heart failure) (HCC)    dry wt as of 11/06/16 is 168 lbs.  . Chronic lower back pain   . COPD  (chronic obstructive pulmonary disease) ([place])   . DDD (degenerative disc disease)    lumbar and cervical.   . Diverticular disease   . Fibromyalgia    Patient states dx was around her late 39s but she had sx's for years prior to this.  . H/O hiatal hernia   . Hay fever   . History of pneumonia    hospitalized 12/2011, 02/2013, and 07/2013 [facility]) for this  . HTN (hypertension)   . Idiopathic angio-edema-urticaria 72014   Angioedema component was very minimal  . Insomnia   . Iron  deficiency anemia    Hematologist in [place], [place] did extensive w/u; no cause found; failed oral supplement;; gets fairly regular (q73m or so) IV iron infusions (Venofer -iron sucrose- 200mg  with procrit.  "for 14 yr I've been getting blood work q month & getting infusions prn" (07/12/2013).  Dr. Marin Olp locally, iron infusions done, EPO deficiency dx'd  . Migraine syndrome    "not as often anymore; used to be ~ q wk" (07/12/2013)  . Mixed incontinence urge and stress   . Nephrolithiasis    "passed all on my own or they are still in there" (07/12/2013)  . OSA on CPAP    prior to move to [place]--had another sleep study 10/2015 w/pulm Dr. Camillo Flaming.  . Osteoarthritis    "severe; progressing fast" (07/12/2013); multiple joints-not surgical candidate for TKR (03/2015).  Triamcinolon knee injections by Dr. Tessa Lerner 12/2017.  [name] Kitchen Parkinsonism [facility]) 2018   Dr. Carles Collet, neuro, saw her 11/24/17 and recommended d/c of abilify as first step.   [name] Kitchen Pernicious anemia 08/24/2014  . Pleural plaque with presence of asbestos 07/22/2013  . Pulmonary embolism ([place]) 07/2013   Dx at [facility] with very small peripheral upper lobe pe 07/2013: pt took coumadin for about 8-9 mo  . Pyelonephritis    "several times over the last 30 yr" (07/12/2013)  . RBBB (right bundle branch block)   . Recurrent major depression ([place])   . Recurrent UTI    hx of hospitalization for pyelonephritis; started abx prophylaxis 06/2015  . Syncope    Hypotensive; ED visit--Dr. Terrence Dupont did  Cath--nonobstructive CAD, EF 55-60%.  In retrospect, suspect pt rx med misuse/polypharmacy  . Tension headache, chronic    There were no vitals taken for this visit.  Opioid Risk Score:   Fall Risk Score:  `1  Depression screen PHQ 2/9  Depression screen [facility] 2/9 05/27/2018 05/19/2018 03/11/2018 02/11/2018 01/14/2018 12/11/2017 11/17/2017  Decreased Interest 1 0 1 1 0 1 1  Down, Depressed, Hopeless 1 0 1 1 0 1 1  PHQ - 2 Score 2 0 2 2 0 2 2  Altered sleeping 0 - - - - - -  Tired, decreased energy 1 - - - - - -  Change in appetite 0 - - - - - -  Feeling bad or failure about yourself  0 - - - - - -  Trouble concentrating 0 - - - - - -  Moving slowly or fidgety/restless 0 - - - - - -  Suicidal thoughts 0 - - - - - -  PHQ-9 Score 3 - - - - - -  Difficult doing work/chores Not difficult at all - - - - - -  Some recent data might be hidden     Review of Systems  Constitutional: Negative.   HENT: Negative.   Eyes: Negative.   Respiratory: Negative.   Cardiovascular: Negative.   Gastrointestinal: Positive for constipation and nausea.  Endocrine: Negative.   Genitourinary: Negative.   Musculoskeletal: Positive for arthralgias, back pain, joint swelling and myalgias.  Skin: Negative.   Allergic/Immunologic: Negative.   Neurological: Negative.   Hematological: Negative.   Psychiatric/Behavioral: Negative.   All other systems reviewed and are negative.      Objective:   Physical Exam  Constitutional: She appears well-developed and well-nourished.  HENT:  Head: Normocephalic and atraumatic.  Neck: Normal range of motion. Neck supple.  Cardiovascular: Normal rate and regular rhythm.  Pulmonary/Chest: Effort normal and breath sounds normal.  Musculoskeletal:  Normal Muscle Bulk  and Muscle Testing Reveals: Upper Extremities: Full ROM and Muscle Strength 5/5 Thoracic Paraspinal Tenderness: T-1-T-3 T-7-T-9 Lumbar Paraspinal Tenderness: L-3-L-5 Lower Extremities: Full ROM and Muscle  Strength 5/5 Arises from chair slowly using walker for support Antalgic gait  Neurological: She is alert.  Skin: Skin is warm and dry.  Psychiatric: She has a normal mood and affect.  Nursing note and vitals reviewed.         Assessment & Plan:  1. Functional deficits secondary to Gait disorder:Continue with HEP.06/02/2018 2. Chronic Back pain/ Lumbar Radiculitis/fibromyalgia /R>L Knee OA Pain Management: 06/02/2018 Refilled: Oxycodone 10/325mg one tablet every 6 hours #100 and Voltaren Gel We will continue the opioid monitoring program, this consists of regular clinic visits, examinations, urine drug screen, pill counts as well as use of [place] Controlled Substance reporting System. 3. Depression with anxiety/Grief reaction/Mood: 06/02/2018 Continue Xanax,Trazodone and Cymbalta .  4. [name] with asthma:. Albuterol prn. :Pulmonology Following. 06/02/2018 5. Bilateral Osteoarthritis of Bilateral Knees: S/PZilretta Injectionon 05/28/2019with Good Relief Noted. Dr. Juanda Bond Orthopedist.. 06/02/2018 6. Fibromyalgia: Continue Home exercise regime. 06/02/2018 7. Cervicalgia: No complaints today.Continue HEP as tolerated. Alternate with heat and ice therapy. Continue to monitor. 06/02/18  20 minutes of face to face patient care time was spent during this visit. All questions were encouraged and answered.  F/U in 1 month

## 2018-06-12 ENCOUNTER — Other Ambulatory Visit: Payer: Self-pay | Admitting: Physician Assistant

## 2018-07-01 ENCOUNTER — Encounter: Payer: Self-pay | Admitting: Registered Nurse

## 2018-07-01 ENCOUNTER — Encounter: Payer: Medicare Other | Attending: Physical Medicine & Rehabilitation | Admitting: Registered Nurse

## 2018-07-01 ENCOUNTER — Other Ambulatory Visit: Payer: Self-pay

## 2018-07-01 VITALS — BP 103/67 | HR 79 | Ht 64.0 in | Wt 143.0 lb

## 2018-07-01 DIAGNOSIS — F411 Generalized anxiety disorder: Secondary | ICD-10-CM | POA: Diagnosis not present

## 2018-07-01 DIAGNOSIS — R2689 Other abnormalities of gait and mobility: Secondary | ICD-10-CM | POA: Diagnosis not present

## 2018-07-01 DIAGNOSIS — M1711 Unilateral primary osteoarthritis, right knee: Secondary | ICD-10-CM

## 2018-07-01 DIAGNOSIS — Z79899 Other long term (current) drug therapy: Secondary | ICD-10-CM | POA: Insufficient documentation

## 2018-07-01 DIAGNOSIS — Z5181 Encounter for therapeutic drug level monitoring: Secondary | ICD-10-CM | POA: Diagnosis not present

## 2018-07-01 DIAGNOSIS — G894 Chronic pain syndrome: Secondary | ICD-10-CM | POA: Diagnosis not present

## 2018-07-01 DIAGNOSIS — M797 Fibromyalgia: Secondary | ICD-10-CM

## 2018-07-01 DIAGNOSIS — M542 Cervicalgia: Secondary | ICD-10-CM

## 2018-07-01 DIAGNOSIS — M5416 Radiculopathy, lumbar region: Secondary | ICD-10-CM | POA: Diagnosis not present

## 2018-07-01 DIAGNOSIS — M1712 Unilateral primary osteoarthritis, left knee: Secondary | ICD-10-CM | POA: Diagnosis not present

## 2018-07-01 DIAGNOSIS — F329 Major depressive disorder, single episode, unspecified: Secondary | ICD-10-CM | POA: Diagnosis not present

## 2018-07-01 MED ORDER — OXYCODONE-ACETAMINOPHEN 10-325 MG PO TABS: 1.0000 | ORAL_TABLET | Freq: Four times a day (QID) | ORAL | 0 refills | Status: DC | PRN

## 2018-07-01 NOTE — Progress Notes (Signed)
Subjective:    Patient ID: Linda [name], female    DOB: 08-31-1946, 72 y.o.   MRN: [id number]  HPI: [name] is a 72 year old female who returns for follow up appointment for chronic pain and medication refill. She states her pain is located in her neck, lower back radiating into her bilateral lower extremities and bilateral knees. She rates her pain 6. Her current exercise regime is walking.   [name] Morphine Equivalent is 60.00 MME. She is also prescribed Alprazolam by [name] .We have discussed the black box warning of using opioids and benzodiazepines. I highlighted the dangers of using these drugs together and discussed the adverse events including respiratory suppression, overdose, cognitive impairment and importance of compliance with current regimen. We will continue to monitor and adjust as indicated.   Last Oral Swab was Performed on 04/14/2018, it was consistent.      Pain Inventory Average Pain 6 Pain Right Now 6 My pain is constant, dull and aching  In the last 24 hours, has pain interfered with the following? General activity 5 Relation with others 6 Enjoyment of life 9 What TIME of day is your pain at its worst? varies Sleep (in general) Fair  Pain is worse with: walking, bending, standing and some activites Pain improves with: rest, medication and injections Relief from Meds: 5  Mobility how many minutes can you walk? 10 ability to climb steps?  no do you drive?  yes  Function not employed: date last employed 2002 I need assistance with the following:  meal prep, household duties and shopping  Neuro/Psych bladder control problems weakness tingling anxiety  Prior Studies Any changes since last visit?  no  Physicians involved in your care Any changes since last visit?  no   Family History  Problem Relation Age of Onset  . Arthritis Mother   . Kidney disease Mother   . Heart disease Father   . Stroke Father   .  Hypertension Father   . Diabetes Father   . Heart attack Father   . Heart attack Paternal Grandmother   . Diabetes Sister        one sister  . Hypertension Sister   . Hypertension Brother   . Multiple sclerosis Son    Social History   Socioeconomic History  . Marital status: Widowed    Spouse name: Not on file  . Number of children: 2  . Years of education: Not on file  . Highest education level: Not on file  Occupational History  . 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 Financial resource strain: Not on file  . Food insecurity:    Worry: Not on file    Inability: Not on file  . Transportation needs:    Medical: Not on file    Non-medical: Not on file  Tobacco Use  . Smoking status: Never Smoker  . Smokeless tobacco: Never Used  . Tobacco comment: never used tobacco  Substance and Sexual Activity  . Alcohol use: No    Alcohol/week: 0.0 oz  . Drug use: No  . Sexual activity: Not Currently  Lifestyle  . Physical activity:    Days per week: Not on file    Minutes per session: Not on file  . Stress: Not on file  Relationships  . Social connections:    Talks on phone: Not on file    Gets together: Not on file  Attends religious service: Not on file    Active member of club or organization: Not on file    Attends meetings of clubs or organizations: Not on file    Relationship status: Not on file  Other Topics Concern  . Not on file  Social History Narrative   Widowed, 2 sons.  Relocated to [place] 09/2012 to be closer to her son who has MS.   Husband d 2015--mesothelioma.   Occupation: former Pharmacist, hospital.   Education: masters degree level.   No T/A/Ds.   Lives in [place], independent living.   Past Surgical History:  Procedure Laterality Date  . APPENDECTOMY  1960  . AXILLARY SURGERY Left 1978   Multiple "lump" in armpit per pt  . CARDIAC CATHETERIZATION  01/2013   nonobstructive CAD, EF 55-60%  . CARDIOVASCULAR STRESS TEST   02/22/15   Low risk myocard perf imaging; wall motion normal, normal EF  . carotid duplex doppler  10/21/2017   R vertebral flow suggestive of possible distal obstruction.  Pt declined further w/u as of 10/29/17 but need to revisit this problem periodically.  . COCCYX REMOVAL  1972  . DEXA  06/05/2017   T-score -3.1  . DILATION AND CURETTAGE OF UTERUS  ? 1970's  . ESOPHAGOGASTRODUODENOSCOPY N/A 07/19/2014   Gastritis found + in the setting of supratherapeutic INR, +plavix, + meloxicam.  . EYE SURGERY Left 2012-2013   "injections for ~ 1 yr; don't really know what for" (07/12/2013)  . HEEL SPUR SURGERY Left 2008  . KNEE SURGERY  2005  . LEFT HEART CATHETERIZATION WITH CORONARY ANGIOGRAM N/A 01/30/2013   Procedure: LEFT HEART CATHETERIZATION WITH CORONARY ANGIOGRAM;  Surgeon: Clent Demark, MD;  Location: [facility] CATH LAB;  Service: Cardiovascular;  Laterality: N/A;  . PLANTAR FASCIA RELEASE Left 2008  . SPIROMETRY  04/25/14   In hosp for acute asthma/COPD flare: mixed obstructive and restrictive lung disease. The FEV1 is severely reduced at 45% predicted.  FEV1 signif decreased compared to prior spirometry 07/23/13.  . TENDON RELEASE  1996   Right forearm and hand  . TOTAL ABDOMINAL HYSTERECTOMY  1974  . TRANSTHORACIC ECHOCARDIOGRAM  01/2013; 04/2014;08/2015; 09/2017   2014--NORMAL.  2015--focal basal septal hypertrophy, EF 55-60%, grade I diast dysfxn, mild LAE.  08/2015 EF 55-60%, nl LV syst fxn, grade I DD, valves wnl. 10/21/17: EF 65-70%, grd I DD, o/w normal.   Past Medical History:  Diagnosis Date  . Acute upper GI bleed 06/2014   while pt taking coumadin, plavix, and meloxicam---despite being told not to take coumadin.  . Anginal pain ([place])    Nonobstructive CAD 2014; however, her cardiologist put her on a statin for this and NOT for hyperlipidemia per pt report.  Atyp CP 08/2017 at card f/u, plan for myoc perf imaging.  . Anxiety    panic attacks  . Asthma    w/ asbestososis   . BPPV (benign  paroxysmal positional vertigo) 12/16/2012  . Chronic diastolic CHF (congestive heart failure) (HCC)    dry wt as of 11/06/16 is 168 lbs.  . Chronic lower back pain   . COPD (chronic obstructive pulmonary disease) ([place])   . DDD (degenerative disc disease)    lumbar and cervical.   . Diverticular disease   . Fibromyalgia    Patient states dx was around her late 69s but she had sx's for years prior to this.  . H/O hiatal hernia   . Hay fever   . History of pneumonia  hospitalized 12/2011, 02/2013, and 07/2013 [facility]) for this  . HTN (hypertension)   . Idiopathic angio-edema-urticaria 72014   Angioedema component was very minimal  . Insomnia   . Iron deficiency anemia    Hematologist in [place], [place] did extensive w/u; no cause found; failed oral supplement;; gets fairly regular (q24m or so) IV iron infusions (Venofer -iron sucrose- 200mg  with procrit.  "for 14 yr I've been getting blood work q month & getting infusions prn" (07/12/2013).  Dr. Marin Olp locally, iron infusions done, EPO deficiency dx'd  . Migraine syndrome    "not as often anymore; used to be ~ q wk" (07/12/2013)  . Mixed incontinence urge and stress   . Nephrolithiasis    "passed all on my own or they are still in there" (07/12/2013)  . OSA on CPAP    prior to move to [place]--had another sleep study 10/2015 w/pulm Dr. Camillo Flaming.  . Osteoarthritis    "severe; progressing fast" (07/12/2013); multiple joints-not surgical candidate for TKR (03/2015).  Triamcinolon knee injections by Dr. Tessa Lerner 12/2017.  [name] Kitchen Parkinsonism [facility]) 2018   Dr. Carles Collet, neuro, saw her 11/24/17 and recommended d/c of abilify as first step.   [name] Kitchen Pernicious anemia 08/24/2014  . Pleural plaque with presence of asbestos 07/22/2013  . Pulmonary embolism ([place]) 07/2013   Dx at [facility] with very small peripheral upper lobe pe 07/2013: pt took coumadin for about 8-9 mo  . Pyelonephritis    "several times over the last 30 yr" (07/12/2013)  . RBBB (right bundle branch block)   . Recurrent  major depression ([place])   . Recurrent UTI    hx of hospitalization for pyelonephritis; started abx prophylaxis 06/2015  . Syncope    Hypotensive; ED visit--Dr. Terrence Dupont did Cath--nonobstructive CAD, EF 55-60%.  In retrospect, suspect pt rx med misuse/polypharmacy  . Tension headache, chronic    BP 103/67   Pulse 79   Ht 5\' 4"  (1.626 m)   Wt 143 lb (64.9 kg)   SpO2 95%   BMI 24.55 kg/m   Opioid Risk Score:   Fall Risk Score:  `1  Depression screen PHQ 2/9  Depression screen [facility] 2/9 07/01/2018 05/27/2018 05/19/2018 03/11/2018 02/11/2018 01/14/2018 12/11/2017  Decreased Interest 1 1 0 1 1 0 1  Down, Depressed, Hopeless 1 1 0 1 1 0 1  PHQ - 2 Score 2 2 0 2 2 0 2  Altered sleeping - 0 - - - - -  Tired, decreased energy - 1 - - - - -  Change in appetite - 0 - - - - -  Feeling bad or failure about yourself  - 0 - - - - -  Trouble concentrating - 0 - - - - -  Moving slowly or fidgety/restless - 0 - - - - -  Suicidal thoughts - 0 - - - - -  PHQ-9 Score - 3 - - - - -  Difficult doing work/chores - Not difficult at all - - - - -  Some recent data might be hidden   Review of Systems  Constitutional: Negative.   HENT: Negative.   Eyes: Negative.   Respiratory: Negative.   Cardiovascular:       Limb swelling  Gastrointestinal: Positive for constipation and nausea.  Endocrine: Negative.   Genitourinary: Negative.   Musculoskeletal: Negative.   Skin: Negative.   Allergic/Immunologic: Negative.   Neurological: Negative.   Hematological: Negative.   Psychiatric/Behavioral: Negative.   All other systems reviewed and are negative.  Objective:   Physical Exam  Constitutional: She is oriented to person, place, and time. She appears well-developed and well-nourished.  HENT:  Head: Normocephalic and atraumatic.  Neck: Normal range of motion. Neck supple.  Cervical Paraspinal Tenderness: C-5-C-6  Cardiovascular: Normal rate and regular rhythm.  Pulmonary/Chest: Effort normal and  breath sounds normal.  Musculoskeletal:  Normal Muscle Bulk and Muscle testing Reveals: Upper Extremities: Full ROM and Muscle Strength 5/5 Bilateral AC Joint Tenderness Thoracic Paraspinal Tenderness: T-1T-3 Lumbar Paraspinal Tenderness: L-3-L-5 Lower Extremities: Full ROM and Muscle Strength 5/5 Arises from chair slowly Narrow Based Gait  Neurological: She is alert and oriented to person, place, and time.  Skin: Skin is warm and dry.  Psychiatric: She has a normal mood and affect. Her behavior is normal.  Nursing note and vitals reviewed.         Assessment & Plan:  1. Functional deficits secondary to Gait disorder:Continue with HEP.07/01/2018 2. Chronic Back pain/ Lumbar Radiculitis/fibromyalgia /R>L Knee OA Pain Management: 07/01/2018 Refilled: Oxycodone 10/325mg one tablet every 6 hours #100 and Voltaren Gel We will continue the opioid monitoring program, this consists of regular clinic visits, examinations, urine drug screen, pill counts as well as use of [place] Controlled Substance reporting System. 3. Depression with anxiety/Grief reaction/Mood: 07/01/2018 Continue Xanax,Trazodone and Cymbalta .  4. [name] with asthma:. Albuterol prn. :Pulmonology Following. 07/01/2018 5. Bilateral Osteoarthritis of Bilateral Knees: S/PZilretta Injectionon 05/28/2019with Good Relief Noted. Dr. Juanda Bond Orthopedist.. 07/01/2018 6. Fibromyalgia: Continue Home exercise regime. 07/01/2018 7. Cervicalgia: No complaints today.Continue HEP as tolerated. Alternate with heat and ice therapy. Continue to monitor. 07/01/18  20 minutes of face to face patient care time was spent during this visit. All questions were encouraged and answered.  F/U in 1 month

## 2018-07-14 ENCOUNTER — Other Ambulatory Visit: Payer: Self-pay | Admitting: Family Medicine

## 2018-07-27 DIAGNOSIS — H2513 Age-related nuclear cataract, bilateral: Secondary | ICD-10-CM | POA: Diagnosis not present

## 2018-07-31 ENCOUNTER — Encounter: Payer: Medicare Other | Attending: Physical Medicine & Rehabilitation | Admitting: Registered Nurse

## 2018-07-31 ENCOUNTER — Encounter: Payer: Self-pay | Admitting: Registered Nurse

## 2018-07-31 VITALS — BP 105/68 | HR 73 | Ht 64.0 in | Wt 145.0 lb

## 2018-07-31 DIAGNOSIS — F411 Generalized anxiety disorder: Secondary | ICD-10-CM | POA: Diagnosis not present

## 2018-07-31 DIAGNOSIS — R2689 Other abnormalities of gait and mobility: Secondary | ICD-10-CM | POA: Diagnosis not present

## 2018-07-31 DIAGNOSIS — M1712 Unilateral primary osteoarthritis, left knee: Secondary | ICD-10-CM | POA: Diagnosis not present

## 2018-07-31 DIAGNOSIS — M7061 Trochanteric bursitis, right hip: Secondary | ICD-10-CM | POA: Diagnosis not present

## 2018-07-31 DIAGNOSIS — M5416 Radiculopathy, lumbar region: Secondary | ICD-10-CM

## 2018-07-31 DIAGNOSIS — G894 Chronic pain syndrome: Secondary | ICD-10-CM | POA: Diagnosis not present

## 2018-07-31 DIAGNOSIS — M1711 Unilateral primary osteoarthritis, right knee: Secondary | ICD-10-CM | POA: Diagnosis not present

## 2018-07-31 DIAGNOSIS — Z79899 Other long term (current) drug therapy: Secondary | ICD-10-CM | POA: Diagnosis not present

## 2018-07-31 DIAGNOSIS — Z5181 Encounter for therapeutic drug level monitoring: Secondary | ICD-10-CM

## 2018-07-31 DIAGNOSIS — M7062 Trochanteric bursitis, left hip: Secondary | ICD-10-CM

## 2018-07-31 DIAGNOSIS — I950 Idiopathic hypotension: Secondary | ICD-10-CM | POA: Diagnosis not present

## 2018-07-31 DIAGNOSIS — M797 Fibromyalgia: Secondary | ICD-10-CM

## 2018-07-31 DIAGNOSIS — F329 Major depressive disorder, single episode, unspecified: Secondary | ICD-10-CM

## 2018-07-31 MED ORDER — OXYCODONE-ACETAMINOPHEN 10-325 MG PO TABS: 1.0000 | ORAL_TABLET | Freq: Four times a day (QID) | ORAL | 0 refills | Status: DC | PRN

## 2018-07-31 NOTE — Progress Notes (Signed)
Subjective:    Patient ID: Linda [name], female    DOB: 1945-12-24, 72 y.o.   MRN: [id number]  HPI: Mr. Linda [name] is a 72 year old female who returns for follow up appointment for chronic pain and medication refill. She states her pain is located in her lower back radiating into her bilateral lower extremities, bilateral hips and bilateral knee pain. She rates her pain 5. Her current exercise regime is walking.   Linda [name] arrived to office hypotensive, blood pressure re-checked. She refuses ED evaluation. Also states she will call her cardiologist, she was instructed to keep blood pressure log, she verbalizes understanding. She was transferred to wheelchair and escorted to lobby.   Linda [name] Morphine Equivalent is 60.00 MME. She is also prescribed Alprazolam by [name] .We have discussed the black box warning of using opioids and benzodiazepines. I highlighted the dangers of using these drugs together and discussed the adverse events including respiratory suppression, overdose, cognitive impairment and importance of compliance with current regimen. We will continue to monitor and adjust as indicated.   Last Oral Swab was Performed on 04/14/2018, it was consistent.   Pain Inventory Average Pain 5 Pain Right Now 5 My pain is sharp and aching  In the last 24 hours, has pain interfered with the following? General activity 7 Relation with others 7 Enjoyment of life 7 What TIME of day is your pain at its worst? daytime Sleep (in general) Fair  Pain is worse with: walking, bending, standing and some activites Pain improves with: rest, medication and injections Relief from Meds: 7  Mobility walk without assistance use a walker ability to climb steps?  no do you drive?  yes  Function retired  Neuro/Psych bladder control problems trouble walking spasms anxiety  Prior Studies Any changes since last visit?  no  Physicians involved in your care Any changes  since last visit?  no   Family History  Problem Relation Age of Onset  . Arthritis Mother   . Kidney disease Mother   . Heart disease Father   . Stroke Father   . Hypertension Father   . Diabetes Father   . Heart attack Father   . Heart attack Paternal Grandmother   . Diabetes Sister        one sister  . Hypertension Sister   . Hypertension Brother   . Multiple sclerosis Son    Social History   Socioeconomic History  . Marital status: Widowed    Spouse name: Not on file  . Number of children: 2  . Years of education: Not on file  . Highest education level: Not on file  Occupational History  . 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 Financial resource strain: Not on file  . Food insecurity:    Worry: Not on file    Inability: Not on file  . Transportation needs:    Medical: Not on file    Non-medical: Not on file  Tobacco Use  . Smoking status: Never Smoker  . Smokeless tobacco: Never Used  . Tobacco comment: never used tobacco  Substance and Sexual Activity  . Alcohol use: No    Alcohol/week: 0.0 standard drinks  . Drug use: No  . Sexual activity: Not Currently  Lifestyle  . Physical activity:    Days per week: Not on file    Minutes per session: Not on file  . Stress: Not on file  Relationships  . Social connections:    Talks on phone: Not on file    Gets together: Not on file    Attends religious service: Not on file    Active member of club or organization: Not on file    Attends meetings of clubs or organizations: Not on file    Relationship status: Not on file  Other Topics Concern  . Not on file  Social History Narrative   Widowed, 2 sons.  Relocated to [place] 09/2012 to be closer to her son who has MS.   Husband d 2015--mesothelioma.   Occupation: former Pharmacist, hospital.   Education: masters degree level.   No T/A/Ds.   Lives in [place], independent living.   Past Surgical History:  Procedure Laterality Date  .  APPENDECTOMY  1960  . AXILLARY SURGERY Left 1978   Multiple "lump" in armpit per pt  . CARDIAC CATHETERIZATION  01/2013   nonobstructive CAD, EF 55-60%  . CARDIOVASCULAR STRESS TEST  02/22/15   Low risk myocard perf imaging; wall motion normal, normal EF  . carotid duplex doppler  10/21/2017   R vertebral flow suggestive of possible distal obstruction.  Pt declined further w/u as of 10/29/17 but need to revisit this problem periodically.  . COCCYX REMOVAL  1972  . DEXA  06/05/2017   T-score -3.1  . DILATION AND CURETTAGE OF UTERUS  ? 1970's  . ESOPHAGOGASTRODUODENOSCOPY N/A 07/19/2014   Gastritis found + in the setting of supratherapeutic INR, +plavix, + meloxicam.  . EYE SURGERY Left 2012-2013   "injections for ~ 1 yr; don't really know what for" (07/12/2013)  . HEEL SPUR SURGERY Left 2008  . KNEE SURGERY  2005  . LEFT HEART CATHETERIZATION WITH CORONARY ANGIOGRAM N/A 01/30/2013   Procedure: LEFT HEART CATHETERIZATION WITH CORONARY ANGIOGRAM;  Surgeon: Clent Demark, MD;  Location[facility] CATH LAB;  Service: Cardiovascular;  Laterality: N/A;  . PLANTAR FASCIA RELEASE Left 2008  . SPIROMETRY  04/25/14   In hosp for acute asthma/COPD flare: mixed obstructive and restrictive lung disease. The FEV1 is severely reduced at 45% predicted.  FEV1 signif decreased compared to prior spirometry 07/23/13.  . TENDON RELEASE  1996   Right forearm and hand  . TOTAL ABDOMINAL HYSTERECTOMY  1974  . TRANSTHORACIC ECHOCARDIOGRAM  01/2013; 04/2014;08/2015; 09/2017   2014--NORMAL.  2015--focal basal septal hypertrophy, EF 55-60%, grade I diast dysfxn, mild LAE.  08/2015 EF 55-60%, nl LV syst fxn, grade I DD, valves wnl. 10/21/17: EF 65-70%, grd I DD, o/w normal.   Past Medical History:  Diagnosis Date  . Acute upper GI bleed 06/2014   while pt taking coumadin, plavix, and meloxicam---despite being told not to take coumadin.  . Anginal pain ([place])    Nonobstructive CAD 2014; however, her cardiologist put her on a statin  for this and NOT for hyperlipidemia per pt report.  Atyp CP 08/2017 at card f/u, plan for myoc perf imaging.  . Anxiety    panic attacks  . Asthma    w/ asbestososis   . BPPV (benign paroxysmal positional vertigo) 12/16/2012  . Chronic diastolic CHF (congestive heart failure) (HCC)    dry wt as of 11/06/16 is 168 lbs.  . Chronic lower back pain   . COPD (chronic obstructive pulmonary disease) ([place])   . DDD (degenerative disc disease)    lumbar and cervical.   . Diverticular disease   . Fibromyalgia    Patient states dx was around her late 79s but she  had sx's for years prior to this.  . H/O hiatal hernia   . Hay fever   . History of pneumonia    hospitalized 12/2011, 02/2013, and 07/2013 [facility]) for this  . HTN (hypertension)   . Idiopathic angio-edema-urticaria 72014   Angioedema component was very minimal  . Insomnia   . Iron deficiency anemia    Hematologist in [place], [place] did extensive w/u; no cause found; failed oral supplement;; gets fairly regular (q31m or so) IV iron infusions (Venofer -iron sucrose- 200mg  with procrit.  "for 14 yr I've been getting blood work q month & getting infusions prn" (07/12/2013).  Dr. Marin Olp locally, iron infusions done, EPO deficiency dx'd  . Migraine syndrome    "not as often anymore; used to be ~ q wk" (07/12/2013)  . Mixed incontinence urge and stress   . Nephrolithiasis    "passed all on my own or they are still in there" (07/12/2013)  . OSA on CPAP    prior to move to [place]--had another sleep study 10/2015 w/pulm Dr. Camillo Flaming.  . Osteoarthritis    "severe; progressing fast" (07/12/2013); multiple joints-not surgical candidate for TKR (03/2015).  Triamcinolon knee injections by Dr. Tessa Lerner 12/2017.  [name] Kitchen Parkinsonism [facility]) 2018   Dr. Carles Collet, neuro, saw her 11/24/17 and recommended d/c of abilify as first step.   [name] Kitchen Pernicious anemia 08/24/2014  . Pleural plaque with presence of asbestos 07/22/2013  . Pulmonary embolism ([place]) 07/2013   Dx at [facility] with very small  peripheral upper lobe pe 07/2013: pt took coumadin for about 8-9 mo  . Pyelonephritis    "several times over the last 30 yr" (07/12/2013)  . RBBB (right bundle branch block)   . Recurrent major depression ([place])   . Recurrent UTI    hx of hospitalization for pyelonephritis; started abx prophylaxis 06/2015  . Syncope    Hypotensive; ED visit--Dr. Terrence Dupont did Cath--nonobstructive CAD, EF 55-60%.  In retrospect, suspect pt rx med misuse/polypharmacy  . Tension headache, chronic    BP (!) 86/47 (BP Location: Left Arm, Patient Position: Sitting, Cuff Size: Normal)   Pulse 76   Ht 5\' 4"  (1.626 m)   Wt 145 lb (65.8 kg)   SpO2 95%   BMI 24.89 kg/m   Opioid Risk Score:   Fall Risk Score:  `1  Depression screen PHQ 2/9  Depression screen [place] 2/9 07/01/2018 05/27/2018 05/19/2018 03/11/2018 02/11/2018 01/14/2018 12/11/2017  Decreased Interest 1 1 0 1 1 0 1  Down, Depressed, Hopeless 1 1 0 1 1 0 1  PHQ - 2 Score 2 2 0 2 2 0 2  Altered sleeping - 0 - - - - -  Tired, decreased energy - 1 - - - - -  Change in appetite - 0 - - - - -  Feeling bad or failure about yourself  - 0 - - - - -  Trouble concentrating - 0 - - - - -  Moving slowly or fidgety/restless - 0 - - - - -  Suicidal thoughts - 0 - - - - -  PHQ-9 Score - 3 - - - - -  Difficult doing work/chores - Not difficult at all - - - - -  Some recent data might be hidden     Review of Systems  Constitutional: Negative.   HENT: Negative.   Eyes: Negative.   Respiratory: Positive for shortness of breath.   Cardiovascular: Negative.   Gastrointestinal: Positive for constipation and nausea.  Endocrine: Negative.  Genitourinary: Negative.   Musculoskeletal: Positive for gait problem and joint swelling.  Skin: Negative.   Allergic/Immunologic: Negative.   Neurological: Positive for dizziness.  Hematological: Negative.   Psychiatric/Behavioral: The patient is nervous/anxious.   All other systems reviewed and are negative.      Objective:     Physical Exam  Constitutional: She is oriented to person, place, and time. She appears well-developed and well-nourished.  HENT:  Head: Normocephalic and atraumatic.  Neck: Normal range of motion. Neck supple.  Cardiovascular: Normal rate and regular rhythm.  Pulmonary/Chest: Effort normal and breath sounds normal.  Musculoskeletal:  Normal Muscle Bulk and Muscle Testing Reveals:  Upper Extremities: Full ROM and Muscle Strength 5/5 Thoracic Paraspinal Tenderness: T-1-T-3 Lumbar Paraspinal Tenderness: L-3-L-5 Bilateral Greater Trochanter Tenderness Lower Extremities: Full ROM and Muscle Strength 5/5 Transfered to wheelchair  Neurological: She is alert and oriented to person, place, and time.  Skin: Skin is warm and dry.  Psychiatric: She has a normal mood and affect. Her behavior is normal.  Nursing note and vitals reviewed.         Assessment & Plan:  1. Functional deficits secondary to Gait disorder:Continue with HEP.07/31/2018 2. Chronic Back pain/ Lumbar Radiculitis/fibromyalgia /R>L Knee OA Pain Management: 07/31/2018 Refilled: Oxycodone 10/325mg one tablet every 6 hours #100 and Voltaren Gel We will continue the opioid monitoring program, this consists of regular clinic visits, examinations, urine drug screen, pill counts as well as use of [place] Controlled Substance reporting System. 3. Depression with anxiety/Grief reaction/Mood: 07/31/2018 Continue Xanax,Trazodone and Cymbalta .  4. [name] with asthma:. Albuterol prn. :Pulmonology Following. 07/31/2018 5. Bilateral Osteoarthritis of Bilateral Knees: S/PZilretta Injectionon 05/28/2019with Good Relief Noted. Dr. Juanda Bond Orthopedist.. 07/31/2018 6. Fibromyalgia: Continue Home exercise regime. 07/31/2018 7. Cervicalgia:No complaints today.Continue HEP as tolerated. Alternate with heat and ice therapy. Continue to monitor. 07/31/18 8. Idiopathic Hypotension: Refuses ED Evaluation. She states she will  call her cardiologist. Blood Pressure re-checked, instructed to keep blood pressure log. 9. Bilateral Greater Trochanteric Bursitis: Alternate Ice and Heat Therapy. Continue to Monitor.   20 minutes of face to face patient care time was spent during this visit. All questions were encouraged and answered.  F/U in 1 month

## 2018-08-04 ENCOUNTER — Ambulatory Visit: Payer: Self-pay

## 2018-08-04 NOTE — Telephone Encounter (Signed)
SW patient regarding blood pressure readings.  Blood pressure low over the weekend, high the past 2 days, does report headache and lightheadedness. Patient states she is unavailable until Friday d/t other obligations. Advised patient to call cardiology to report readings and symptoms for sooner appointment (currently f/u in September). Patient is scheduled to see PCP this Friday (08/07/18). Also advised to go to the Emergency Room for worsening symptoms, patient verbalized understanding.

## 2018-08-04 NOTE — Telephone Encounter (Signed)
Incoming call from patient with complaint of elevated blood blood pressure.  Stated  Over the weekend blood preesure ran low.  In the area  Of 70's/40's.  Patient states she did not take her metoprolol 25mg   Pt. States  Cardiologist added second dose  In the evening.  Blood pressure yesterday  Was 200/99 , 193/98 today 199/99 second reading approximately 30 minutes later 130/83.  Uses an automatic machine. PCP has no availability today.   Attempted to get her on schedule for later in week.   Appointment scheduled twice.  Patient states she was unable to make both appointments provided.  Provided care advice along with the importance of taking her blood pressure medication.  Instructed to call if dizziness or lightheadedness occurred, or to go to ER.  Pt. Voiced understanding. Patient states that she has an appointment scheduled for Sept. 5th.with Dr.  Aletha Halim keep that appointment with [name]  Reason for Disposition . [0] Systolic BP  >= 109 OR Diastolic >= 323  AND [5] having NO cardiac or neurologic symptoms  Answer Assessment - Initial Assessment Questions 1. BLOOD PRESSURE: "What is the blood pressure?" "Did you take at least two measurements 5 minutes apart?"     199/99 2. ONSET: "When did you take your blood pressure?"     This am 3. HOW: "How did you obtain the blood pressure?" (e.g., visiting nurse, automatic home BP monitor)     automatic 4. HISTORY: "Do you have a history of high blood pressure?"     yes 5. MEDICATIONS: "Are you taking any medications for blood pressure?" "Have you missed any doses recently?"     no 6. OTHER SYMPTOMS: "Do you have any symptoms?" (e.g., headache, chest pain, blurred vision, difficulty breathing, weakness)     Difficulty breathing light headed.  7. PREGNANCY: "Is there any chance you are pregnant?" "When was your last menstrual period?"     na  Protocols used: HIGH BLOOD PRESSURE-A-AH

## 2018-08-04 NOTE — Telephone Encounter (Signed)
Noted  

## 2018-08-07 ENCOUNTER — Encounter: Payer: Self-pay | Admitting: Family Medicine

## 2018-08-07 ENCOUNTER — Ambulatory Visit (INDEPENDENT_AMBULATORY_CARE_PROVIDER_SITE_OTHER): Payer: Medicare Other | Admitting: Family Medicine

## 2018-08-07 VITALS — BP 127/79 | HR 76 | Resp 16 | Ht 64.0 in | Wt 145.0 lb

## 2018-08-07 DIAGNOSIS — R350 Frequency of micturition: Secondary | ICD-10-CM | POA: Diagnosis not present

## 2018-08-07 DIAGNOSIS — I959 Hypotension, unspecified: Secondary | ICD-10-CM

## 2018-08-07 DIAGNOSIS — I10 Essential (primary) hypertension: Secondary | ICD-10-CM

## 2018-08-07 LAB — BASIC METABOLIC PANEL
BUN: 11 mg/dL (ref 6–23)
CO2: 32 meq/L (ref 19–32)
Calcium: 9.8 mg/dL (ref 8.4–10.5)
Chloride: 101 mEq/L (ref 96–112)
Creatinine, Ser: 0.91 mg/dL (ref 0.40–1.20)
GFR: 64.52 mL/min (ref >60.00)
Glucose, Bld: 88 mg/dL (ref 70–99)
Potassium: 4.3 mEq/L (ref 3.5–5.1)
SODIUM: 140 meq/L (ref 135–145)

## 2018-08-07 LAB — CBC WITH DIFFERENTIAL/PLATELET
Basophils Absolute: 0 10*3/uL (ref 0.0–0.1)
Basophils Relative: 0.4 % (ref 0.0–3.0)
Eosinophils Absolute: 0.1 10*3/uL (ref 0.0–0.7)
Eosinophils Relative: 1.1 % (ref 0.0–5.0)
HCT: 41.2 % (ref 36.0–46.0)
HEMOGLOBIN: 13.6 g/dL (ref 12.0–15.0)
LYMPHS ABS: 2.6 10*3/uL (ref 0.7–4.0)
Lymphocytes Relative: 34.6 % (ref 12.0–46.0)
MCHC: 33.1 g/dL (ref 30.0–36.0)
MCV: 90.9 fl (ref 78.0–100.0)
MONO ABS: 0.6 10*3/uL (ref 0.1–1.0)
MONOS PCT: 7.7 % (ref 3.0–12.0)
NEUTROS PCT: 56.2 % (ref 43.0–77.0)
Neutro Abs: 4.2 10*3/uL (ref 1.4–7.7)
Platelets: 215 10*3/uL (ref 150.0–400.0)
RBC: 4.53 Mil/uL (ref 3.87–5.11)
RDW: 12.7 % (ref 11.5–15.5)
WBC: 7.6 10*3/uL (ref 4.0–10.5)

## 2018-08-07 LAB — POCT URINALYSIS DIPSTICK
Bilirubin, UA: NEGATIVE
Blood, UA: NEGATIVE
Glucose, UA: NEGATIVE
KETONES UA: NEGATIVE
LEUKOCYTES UA: NEGATIVE
NITRITE UA: NEGATIVE
PROTEIN UA: NEGATIVE
Spec Grav, UA: 1.015 (ref 1.010–1.025)
UROBILINOGEN UA: 0.2 U/dL
pH, UA: 6 (ref 5.0–8.0)

## 2018-08-07 MED ORDER — METRONIDAZOLE 0.75 % EX GEL: 1.0000 "application " | Freq: Two times a day (BID) | CUTANEOUS | 3 refills | Status: DC

## 2018-08-07 NOTE — Progress Notes (Signed)
OFFICE VISIT  08/07/2018   CC:  Chief Complaint  Patient presents with  . Hypertension   HPI:    Patient is a 72 y.o. Caucasian female with hx of HTN who presents for elevated blood pressures.  At Dr. Ephriam Knuckles visit 1 week ago she had bp 70/40.   She felt a little unsteady, slight mental fogginess.  They wanted her to go to hosp but she declined. Cardiol appt made for 09/10/18.  BP next 2d <100/50.  Pt stopped her toprol at that time. Then, for the next few days her bp was up to 200/110.  Since RESTARTING toprol she feels better and bp is normal. Currently taking toprol xl 25mg  qd.  Before low bp detected, she recalls 3-4 d of decreased energy and some SOB.  Has only occ cough.  No fevers. Had been hydrating well lots of water and ginger ale.  Intake of solids has been down.  Small BF, small lunch sometimes, eats dinner fine. Denies CP, but some feeling of heart racing at both times of low bp and high bp.  No irregularity felt. No excessive heat/sweating prior to these bp issues. No dysuria.  Has had more frequency than her usual--and not taking fluid pill at all lately.  No swelling/edema. Now she feels better since bp back into normal range.  Bp normal today at home.    Review of bp's over the last 10 mo at MD visits in our EMR show almost all in normal range (highest systolic 409, highest diastolic 77).  Past Medical History:  Diagnosis Date  . Acute upper GI bleed 06/2014   while pt taking coumadin, plavix, and meloxicam---despite being told not to take coumadin.  . Anginal pain ([place])    Nonobstructive CAD 2014; however, her cardiologist put her on a statin for this and NOT for hyperlipidemia per pt report.  Atyp CP 08/2017 at card f/u, plan for myoc perf imaging.  . Anxiety    panic attacks  . Asthma    w/ asbestososis   . BPPV (benign paroxysmal positional vertigo) 12/16/2012  . Chronic diastolic CHF (congestive heart failure) (H[facility])    dry wt as of 11/06/16 is 168 lbs.  .  Chronic lower back pain   . COPD (chronic obstructive pulmonary disease) ([place])   . DDD (degenerative disc disease)    lumbar and cervical.   . Diverticular disease   . Fibromyalgia    Patient states dx was around her late 51s but she had sx's for years prior to this.  . H/O hiatal hernia   . Hay fever   . History of pneumonia    hospitalized 12/2011, 02/2013, and 07/2013 [facility]) for this  . HTN (hypertension)    Renal artery dopplers 04/2013 neg for stenosis.  . Idiopathic angio-edema-urticaria 72014   Angioedema component was very minimal  . Insomnia   . Iron deficiency anemia    Hematologist in [place], [place] did extensive w/u; no cause found; failed oral supplement;; gets fairly regular (q28m or so) IV iron infusions (Venofer -iron sucrose- 200mg  with procrit.  "for 14 yr I've been getting blood work q month & getting infusions prn" (07/12/2013).  Dr. Marin Olp locally, iron infusions done, EPO deficiency dx'd  . Migraine syndrome    "not as often anymore; used to be ~ q wk" (07/12/2013)  . Mixed incontinence urge and stress   . Nephrolithiasis    "passed all on my own or they are still in there" (07/12/2013)  .  Neuroleptic induced Parkinsonism ([place]) 2018   Dr. Carles Collet, neuro, saw her 11/24/17 and recommended d/c of abilify as first step.  D/c'd abilify and pt got complete recovery.  . OSA on CPAP    prior to move to [place]--had another sleep study 10/2015 w/pulm Dr. Camillo Flaming.  . Osteoarthritis    "severe; progressing fast" (07/12/2013); multiple joints-not surgical candidate for TKR (03/2015).  Triamcinolon knee injections by Dr. Tessa Lerner 12/2017.  [name] Kitchen Pernicious anemia 08/24/2014  . Pleural plaque with presence of asbestos 07/22/2013  . Pulmonary embolism ([place]) 07/2013   Dx at [facility] with very small peripheral upper lobe pe 07/2013: pt took coumadin for about 8-9 mo  . Pyelonephritis    "several times over the last 30 yr" (07/12/2013)  . RBBB (right bundle branch block)   . Recurrent major depression ([place])   .  Recurrent UTI    hx of hospitalization for pyelonephritis; started abx prophylaxis 06/2015  . Syncope    Hypotensive; ED visit--Dr. Terrence Dupont did Cath--nonobstructive CAD, EF 55-60%.  In retrospect, suspect pt rx med misuse/polypharmacy  . Tension headache, chronic     Past Surgical History:  Procedure Laterality Date  . APPENDECTOMY  1960  . AXILLARY SURGERY Left 1978   Multiple "lump" in armpit per pt  . CARDIAC CATHETERIZATION  01/2013   nonobstructive CAD, EF 55-60%  . CARDIOVASCULAR STRESS TEST  02/22/15   Low risk myocard perf imaging; wall motion normal, normal EF  . carotid duplex doppler  10/21/2017   R vertebral flow suggestive of possible distal obstruction.  Pt declined further w/u as of 10/29/17 but need to revisit this problem periodically.  . COCCYX REMOVAL  1972  . DEXA  06/05/2017   T-score -3.1  . DILATION AND CURETTAGE OF UTERUS  ? 1970's  . ESOPHAGOGASTRODUODENOSCOPY N/A 07/19/2014   Gastritis found + in the setting of supratherapeutic INR, +plavix, + meloxicam.  . EYE SURGERY Left 2012-2013   "injections for ~ 1 yr; don't really know what for" (07/12/2013)  . HEEL SPUR SURGERY Left 2008  . KNEE SURGERY  2005  . LEFT HEART CATHETERIZATION WITH CORONARY ANGIOGRAM N/A 01/30/2013   Procedure: LEFT HEART CATHETERIZATION WITH CORONARY ANGIOGRAM;  Surgeon: Clent Demark, MD;  Location[facility] C[facility] LAB;  Service: Cardiovascular;  Laterality: N/A;  . PLANTAR FASCIA RELEASE Left 2008  . SPIROMETRY  04/25/14   In hosp for acute asthma/COPD flare: mixed obstructive and restrictive lung disease. The FEV1 is severely reduced at 45% predicted.  FEV1 signif decreased compared to prior spirometry 07/23/13.  . TENDON RELEASE  1996   Right forearm and hand  . TOTAL ABDOMINAL HYSTERECTOMY  1974  . TRANSTHORACIC ECHOCARDIOGRAM  01/2013; 04/2014;08/2015; 09/2017   2014--NORMAL.  2015--focal basal septal hypertrophy, EF 55-60%, grade I diast dysfxn, mild LAE.  08/2015 EF 55-60%, nl LV syst fxn, grade I  DD, valves wnl. 10/21/17: EF 65-70%, grd I DD, o/w normal.    Outpatient Medications Prior to Visit  Medication Sig Dispense Refill  . albuterol (PROVENTIL HFA;VENTOLIN HFA) 108 (90 Base) MCG/ACT inhaler Inhale 2 puffs into the lungs every 6 (six) hours as needed for wheezing or shortness of breath. 1 Inhaler 1  . alendronate (FOSAMAX) 70 MG tablet Take 1 tablet (70 mg total) by mouth every 7 (seven) days. Take with a full glass of water on an empty stomach, first thing in the morning and remain up right for 30 minutes after taking. 12 tablet 3  . ALPRAZolam (XANAX) 1 MG tablet  TAKE 1 TABLET THREE TIMES DAILY AS NEEDED FOR ANXIETY. 90 tablet 5  . aspirin 81 MG tablet Take 81 mg by mouth at bedtime.    . diclofenac sodium (VOLTAREN) 1 % GEL Apply 2 g topically 3 (three) times daily as needed (pain). 2 Tube 2  . DULoxetine (CYMBALTA) 30 MG capsule TAKE 1 CAPSULE ONCE DAILY ALONG WITH 60MG  CAPSULE FOR TOTAL 90MG  DAILY. 30 capsule 5  . DULoxetine (CYMBALTA) 60 MG capsule TAKE 1 CAPSULE A DAY TO BE COMBINED WITH 30MG  CAPSULE 90 capsule 1  . fluticasone (FLONASE) 50 MCG/ACT nasal spray Place 2 sprays into both nostrils daily. 16 g 6  . furosemide (LASIX) 40 MG tablet Take 1 tablet (40 mg total) by mouth daily. (Patient taking differently: Take 40 mg by mouth as needed for fluid. ) 60 tablet 11  . isosorbide mononitrate (IMDUR) 30 MG 24 hr tablet TAKE 2 TABLETS IN THE AM AND 1 TABLET IN THE PM. 90 tablet 2  . lamoTRIgine (LAMICTAL) 100 MG tablet TAKE 1 TABLET EACH DAY. 90 tablet 3  . metoprolol succinate (TOPROL-XL) 25 MG 24 hr tablet TAKE 1 TABLET BY MOUTH TWICE DAILY. 60 tablet 2  . Multiple Vitamin (MULTIVITAMIN WITH MINERALS) TABS tablet Take 1 tablet by mouth daily.    . nitroGLYCERIN (NITROSTAT) 0.4 MG SL tablet Place 1 tablet (0.4 mg total) under the tongue every 5 (five) minutes as needed for chest pain (x 3 doses). 25 tablet 3  . oxyCODONE-acetaminophen (PERCOCET) 10-325 MG tablet Take 1 tablet  by mouth every 6 (six) hours as needed for pain. 100 tablet 0  . OXYGEN Inhale 3 L into the lungs continuous.    . pantoprazole (PROTONIX) 40 MG tablet TAKE 1 TABLET BY MOUTH DAILY. 90 tablet 1  . traZODone (DESYREL) 50 MG tablet TAKE 2-4 TABLETS AT BEDTIME 120 tablet 6  . Vitamin D, Ergocalciferol, (DRISDOL) 50000 units CAPS capsule 1 tab po TWICE per week 24 capsule 3   Facility-Administered Medications Prior to Visit  Medication Dose Route Frequency Provider Last Rate Last Dose  . ferumoxytol (FERAHEME) 510 mg in sodium chloride 0.9 % 100 mL IVPB  510 mg Intravenous Once Volanda Napoleon, MD        Allergies  Allergen Reactions  . Abilify [Aripiprazole] Other (See Comments)    parkinsonism  . Penicillins Itching, Swelling and Rash    Tolerated Cefepime in ED. Has patient had a PCN reaction causing immediate rash, facial/tongue/throat swelling, SOB or lightheadedness with hypotension: Yes Has patient had a PCN reaction causing severe rash involving mucus membranes or skin necrosis: No Has patient had a PCN reaction that required hospitalization: No  Has patient had a PCN reaction occurring within the last 10 years: No     ROS As per HPI  PE: Blood pressure 127/79, pulse 76, resp. rate 16, height 5\' 4"  (1.626 m), weight 145 lb (65.8 kg), SpO2 96 %. Gen: Alert, well appearing.  Patient is oriented to person, place, time, and situation. AFFECT: pleasant, lucid thought and speech. CV: RRR (rate 70s), no m/r/g.   LUNGS: CTA bilat, nonlabored resps, good aeration in all lung fields. EXT: no clubbing, cyanosis, or edema.  LABS:    Chemistry      Component Value Date/Time   NA 137 01/05/2018 0633   NA 141 06/05/2015 1315   K 3.4 (L) 01/05/2018 0633   K 3.9 06/05/2015 1315   CL 98 (L) 01/05/2018 0633   CL 103 06/05/2015 1315  CO2 28 01/05/2018 0633   CO2 28 06/05/2015 1315   BUN 19 01/05/2018 0633   BUN 14 06/05/2015 1315   CREATININE 0.60 01/05/2018 0633   CREATININE 0.87  02/03/2017 1523      Component Value Date/Time   CALCIUM 8.8 (L) 01/05/2018 0633   CALCIUM 9.0 06/05/2015 1315   ALKPHOS 58 01/01/2018 1052   ALKPHOS 84 06/05/2015 1315   AST 29 01/01/2018 1052   AST 23 06/05/2015 1315   ALT 16 01/01/2018 1052   ALT 17 06/05/2015 1315   BILITOT 1.2 01/01/2018 1052   BILITOT 1.00 06/05/2015 1315     CC UA today: normal  IMPRESSION AND PLAN:  1) Hypotension: unknown etiology. ? Viral syndrome-->relative dehydration-->hypotension? No red flags for sepsis, acute coronary syndrome, pulm/urinary infection. Continue metoprolol xl 25mg  qd and continue home bp and HR monitoring. Check CBC w/diff and BMET today. If labile bp is a recurrent thing over the next couple of weeks, esp if symptomatic, then we'll likely get an event monitor for her and r/o arrhythmia.  Spent 25 min with pt today, with >50% of this time spent in counseling and care coordination regarding the above problems.  An After Visit Summary was printed and given to the patient.  FOLLOW UP: Return in about 2 weeks (around 08/21/2018) for f/u bp issues.  Signed:  Crissie Sickles, MD           08/07/2018

## 2018-08-12 ENCOUNTER — Other Ambulatory Visit: Payer: Self-pay | Admitting: Family Medicine

## 2018-08-19 ENCOUNTER — Telehealth: Payer: Self-pay | Admitting: Physical Medicine & Rehabilitation

## 2018-08-19 NOTE — Telephone Encounter (Signed)
That would be fine 

## 2018-08-19 NOTE — Telephone Encounter (Signed)
Is this to change appt?

## 2018-08-19 NOTE — Telephone Encounter (Signed)
Pt would like to change her 9/9 appt with Zella Ball to see ZS for knee injections bc her pain has increased.   Please call to advise. Thanks,  Vilinda Blanks.

## 2018-08-20 NOTE — Telephone Encounter (Signed)
Last injection was Zil[place], bilateral knee, osteoarthritis. Will check will Kathyrn Lass for authorization

## 2018-08-20 NOTE — Telephone Encounter (Signed)
Contacted patient and rescheduled appointment (09/01/2018 11 AM) with Dr. Naaman Plummer for biaterall knee injections with medication refill.  Cancelled appointment with Zella Ball (08/31/2018)

## 2018-08-21 ENCOUNTER — Encounter: Payer: Self-pay | Admitting: Family Medicine

## 2018-08-21 ENCOUNTER — Ambulatory Visit (INDEPENDENT_AMBULATORY_CARE_PROVIDER_SITE_OTHER): Payer: Medicare Other | Admitting: Family Medicine

## 2018-08-21 VITALS — BP 147/82 | HR 82 | Temp 98.0°F | Resp 16 | Ht 64.0 in | Wt 147.0 lb

## 2018-08-21 DIAGNOSIS — N3000 Acute cystitis without hematuria: Secondary | ICD-10-CM | POA: Diagnosis not present

## 2018-08-21 DIAGNOSIS — I10 Essential (primary) hypertension: Secondary | ICD-10-CM

## 2018-08-21 MED ORDER — CIPROFLOXACIN HCL 500 MG PO TABS: 500.0000 mg | ORAL_TABLET | Freq: Two times a day (BID) | ORAL | 0 refills | Status: AC

## 2018-08-21 MED ORDER — METOPROLOL SUCCINATE ER 50 MG PO TB24: ORAL_TABLET | ORAL | 3 refills | Status: DC

## 2018-08-21 NOTE — Progress Notes (Signed)
OFFICE VISIT  08/21/2018   CC:  Chief Complaint  Patient presents with  . Follow-up    HTN / ?UTI    HPI:    Patient is a 72 y.o. Caucasian female who presents for 2 wk f/u labile bp.  She had a couple days of unexplained low bp. Stopped metoprolol and bp shot up so she restarted it and bp's were back in normal range and she was feeling much better in office 2 wks ago. Plan was to f/u today to see how bp's have been trending and see how she feels.  She says her bp has still been running high some, esp in latter part of the day, sometimes as high as 190 over 100, other times bp 130s/80 but sounds like for the majority of bp checks her bp is elevated.  No low bp's since I last saw her.  No dizziness, no HAs, no vision c/o, no CP, no n/v, no palpitations. She has been taking 1/2 of toprol xl 25mg  tab twice per day.  Also, has c/o about 1 wk of urinary urgency, dysuria, and foul odor to urine, sx's getting worse each day.  No fevers.  Some lower back pain diffusely.  No CVA region pain or flank or groin pain. Intake of solids not optimal but drinking plenty of clear liquids. She urinated just prior to coming in office today and can't produce a urine sample for me today while here.  Past Medical History:  Diagnosis Date  . Acute upper GI bleed 06/2014   while pt taking coumadin, plavix, and meloxicam---despite being told not to take coumadin.  . Anginal pain ([place])    Nonobstructive CAD 2014; however, her cardiologist put her on a statin for this and NOT for hyperlipidemia per pt report.  Atyp CP 08/2017 at card f/u, plan for myoc perf imaging.  . Anxiety    panic attacks  . Asthma    w/ asbestososis   . BPPV (benign paroxysmal positional vertigo) 12/16/2012  . Chronic diastolic CHF (congestive heart failure) (HCC)    dry wt as of 11/06/16 is 168 lbs.  . Chronic lower back pain   . COPD (chronic obstructive pulmonary disease) ([place])   . DDD (degenerative disc disease)    lumbar and  cervical.   . Diverticular disease   . Fibromyalgia    Patient states dx was around her late 17s but she had sx's for years prior to this.  . H/O hiatal hernia   . Hay fever   . History of pneumonia    hospitalized 12/2011, 02/2013, and 07/2013 [facility]) for this  . HTN (hypertension)    Renal artery dopplers 04/2013 neg for stenosis.  . Idiopathic angio-edema-urticaria 72014   Angioedema component was very minimal  . Insomnia   . Iron deficiency anemia    Hematologist in [place], [place] did extensive w/u; no cause found; failed oral supplement;; gets fairly regular (q18m or so) IV iron infusions (Venofer -iron sucrose- 200mg  with procrit.  "for 14 yr I've been getting blood work q month & getting infusions prn" (07/12/2013).  Dr. Marin Olp locally, iron infusions done, EPO deficiency dx'd  . Migraine syndrome    "not as often anymore; used to be ~ q wk" (07/12/2013)  . Mixed incontinence urge and stress   . Nephrolithiasis    "passed all on my own or they are still in there" (07/12/2013)  . Neuroleptic induced Parkinsonism ([place]) 2018   Dr. Carles Collet, neuro, saw her 11/24/17 and  recommended d/c of abilify as first step.  D/c'd abilify and pt got complete recovery.  . OSA on CPAP    prior to move to [place]--had another sleep study 10/2015 w/pulm Dr. Camillo Flaming.  . Osteoarthritis    "severe; progressing fast" (07/12/2013); multiple joints-not surgical candidate for TKR (03/2015).  Triamcinolon knee injections by Dr. Tessa Lerner 12/2017.  [name] Kitchen Pernicious anemia 08/24/2014  . Pleural plaque with presence of asbestos 07/22/2013  . Pulmonary embolism ([place]) 07/2013   Dx at [facility] with very small peripheral upper lobe pe 07/2013: pt took coumadin for about 8-9 mo  . Pyelonephritis    "several times over the last 30 yr" (07/12/2013)  . RBBB (right bundle branch block)   . Recurrent major depression ([place])   . Recurrent UTI    hx of hospitalization for pyelonephritis; started abx prophylaxis 06/2015  . Syncope    Hypotensive; ED visit--Dr.  Terrence Dupont did Cath--nonobstructive CAD, EF 55-60%.  In retrospect, suspect pt rx med misuse/polypharmacy  . Tension headache, chronic     Past Surgical History:  Procedure Laterality Date  . APPENDECTOMY  1960  . AXILLARY SURGERY Left 1978   Multiple "lump" in armpit per pt  . CARDIAC CATHETERIZATION  01/2013   nonobstructive CAD, EF 55-60%  . CARDIOVASCULAR STRESS TEST  02/22/15   Low risk myocard perf imaging; wall motion normal, normal EF  . carotid duplex doppler  10/21/2017   R vertebral flow suggestive of possible distal obstruction.  Pt declined further w/u as of 10/29/17 but need to revisit this problem periodically.  . COCCYX REMOVAL  1972  . DEXA  06/05/2017   T-score -3.1  . DILATION AND CURETTAGE OF UTERUS  ? 1970's  . ESOPHAGOGASTRODUODENOSCOPY N/A 07/19/2014   Gastritis found + in the setting of supratherapeutic INR, +plavix, + meloxicam.  . EYE SURGERY Left 2012-2013   "injections for ~ 1 yr; don't really know what for" (07/12/2013)  . HEEL SPUR SURGERY Left 2008  . KNEE SURGERY  2005  . LEFT HEART CATHETERIZATION WITH CORONARY ANGIOGRAM N/A 01/30/2013   Procedure: LEFT HEART CATHETERIZATION WITH CORONARY ANGIOGRAM;  Surgeon: Clent Demark, MD;  Location: [facility] CATH LAB;  Service: Cardiovascular;  Laterality: N/A;  . PLANTAR FASCIA RELEASE Left 2008  . SPIROMETRY  04/25/14   In hosp for acute asthma/COPD flare: mixed obstructive and restrictive lung disease. The FEV1 is severely reduced at 45% predicted.  FEV1 signif decreased compared to prior spirometry 07/23/13.  . TENDON RELEASE  1996   Right forearm and hand  . TOTAL ABDOMINAL HYSTERECTOMY  1974  . TRANSTHORACIC ECHOCARDIOGRAM  01/2013; 04/2014;08/2015; 09/2017   2014--NORMAL.  2015--focal basal septal hypertrophy, EF 55-60%, grade I diast dysfxn, mild LAE.  08/2015 EF 55-60%, nl LV syst fxn, grade I DD, valves wnl. 10/21/17: EF 65-70%, grd I DD, o/w normal.    Outpatient Medications Prior to Visit  Medication Sig Dispense  Refill  . albuterol (PROVENTIL HFA;VENTOLIN HFA) 108 (90 Base) MCG/ACT inhaler Inhale 2 puffs into the lungs every 6 (six) hours as needed for wheezing or shortness of breath. 1 Inhaler 1  . alendronate (FOSAMAX) 70 MG tablet Take 1 tablet (70 mg total) by mouth every 7 (seven) days. Take with a full glass of water on an empty stomach, first thing in the morning and remain up right for 30 minutes after taking. 12 tablet 3  . ALPRAZolam (XANAX) 1 MG tablet TAKE 1 TABLET THREE TIMES DAILY AS NEEDED FOR ANXIETY. 90 tablet 5  .  aspirin 81 MG tablet Take 81 mg by mouth at bedtime.    . diclofenac sodium (VOLTAREN) 1 % GEL Apply 2 g topically 3 (three) times daily as needed (pain). 2 Tube 2  . DULoxetine (CYMBALTA) 30 MG capsule TAKE 1 CAPSULE ONCE DAILY ALONG WITH 60MG  CAPSULE FOR TOTAL 90MG  DAILY. 30 capsule 5  . DULoxetine (CYMBALTA) 60 MG capsule TAKE 1 CAPSULE A DAY TO BE COMBINED WITH 30MG  CAPSULE 90 capsule 1  . fluticasone (FLONASE) 50 MCG/ACT nasal spray USE 2 SPRAYS IN EACH NOSTRIL ONCE DAILY 16 g 5  . furosemide (LASIX) 40 MG tablet Take 1 tablet (40 mg total) by mouth daily. (Patient taking differently: Take 40 mg by mouth as needed for fluid. ) 60 tablet 11  . isosorbide mononitrate (IMDUR) 30 MG 24 hr tablet TAKE 2 TABLETS IN THE AM AND 1 TABLET IN THE PM. 90 tablet 2  . lamoTRIgine (LAMICTAL) 100 MG tablet TAKE 1 TABLET EACH DAY. 90 tablet 3  . metoprolol succinate (TOPROL-XL) 25 MG 24 hr tablet TAKE 1 TABLET BY MOUTH TWICE DAILY. 60 tablet 2  . metroNIDAZOLE (METROGEL) 0.75 % gel Apply 1 application topically 2 (two) times daily. 45 g 3  . Multiple Vitamin (MULTIVITAMIN WITH MINERALS) TABS tablet Take 1 tablet by mouth daily.    . nitroGLYCERIN (NITROSTAT) 0.4 MG SL tablet Place 1 tablet (0.4 mg total) under the tongue every 5 (five) minutes as needed for chest pain (x 3 doses). 25 tablet 3  . oxyCODONE-acetaminophen (PERCOCET) 10-325 MG tablet Take 1 tablet by mouth every 6 (six) hours as  needed for pain. 100 tablet 0  . OXYGEN Inhale 3 L into the lungs continuous.    . pantoprazole (PROTONIX) 40 MG tablet TAKE 1 TABLET BY MOUTH DAILY. 90 tablet 1  . traZODone (DESYREL) 50 MG tablet TAKE 2-4 TABLETS AT BEDTIME 120 tablet 6  . Vitamin D, Ergocalciferol, (DRISDOL) 50000 units CAPS capsule 1 tab po TWICE per week 24 capsule 3  . fluticasone (FLONASE) 50 MCG/ACT nasal spray Place 2 sprays into both nostrils daily. 16 g 6   Facility-Administered Medications Prior to Visit  Medication Dose Route Frequency Provider Last Rate Last Dose  . ferumoxytol (FERAHEME) 510 mg in sodium chloride 0.9 % 100 mL IVPB  510 mg Intravenous Once Volanda Napoleon, MD        Allergies  Allergen Reactions  . Abilify [Aripiprazole] Other (See Comments)    parkinsonism  . Penicillins Itching, Swelling and Rash    Tolerated Cefepime in ED. Has patient had a PCN reaction causing immediate rash, facial/tongue/throat swelling, SOB or lightheadedness with hypotension: Yes Has patient had a PCN reaction causing severe rash involving mucus membranes or skin necrosis: No Has patient had a PCN reaction that required hospitalization: No  Has patient had a PCN reaction occurring within the last 10 years: No     ROS As per HPI  PE: Blood pressure (!) 147/82, pulse 82, temperature 98 F (36.7 C), temperature source Oral, resp. rate 16, height 5\' 4"  (1.626 m), weight 147 lb (66.7 kg), SpO2 98 %. Body mass index is 25.23 kg/m.  Gen: Alert, well appearing.  Patient is oriented to person, place, time, and situation. AFFECT: pleasant, lucid thought and speech. CV: RRR (rate 85 by me) no m/r/g.   LUNGS: CTA bilat, nonlabored resps, good aeration in all lung fields. EXT: no clubbing or cyanosis.  No edema.   LABS:    Chemistry  Component Value Date/Time   NA 140 08/07/2018 1429   NA 141 06/05/2015 1315   K 4.3 08/07/2018 1429   K 3.9 06/05/2015 1315   CL 101 08/07/2018 1429   CL 103 06/05/2015 1315    CO2 32 08/07/2018 1429   CO2 28 06/05/2015 1315   BUN 11 08/07/2018 1429   BUN 14 06/05/2015 1315   CREATININE 0.91 08/07/2018 1429   CREATININE 0.87 02/03/2017 1523      Component Value Date/Time   CALCIUM 9.8 08/07/2018 1429   CALCIUM 9.0 06/05/2015 1315   ALKPHOS 58 01/01/2018 1052   ALKPHOS 84 06/05/2015 1315   AST 29 01/01/2018 1052   AST 23 06/05/2015 1315   ALT 16 01/01/2018 1052   ALT 17 06/05/2015 1315   BILITOT 1.2 01/01/2018 1052   BILITOT 1.00 06/05/2015 1315     IMPRESSION AND PLAN:  1) Uncontrolled HTN: will increase her toprol xl to 50mg  and have her take whole dose in the morning. Continue home bp monitoring.  2) UTI: classic sx's, pt unable to provide urine sample today. Empiric cipro 500 mg bid x 5d. If not approp responding in 2-3 d then she needs to return to give urine sample to send for c/s.  An After Visit Summary was printed and given to the patient.  FOLLOW UP: 2 wk, recheck HTN  Signed:  Crissie Sickles, MD           08/21/2018

## 2018-08-27 ENCOUNTER — Ambulatory Visit: Payer: Medicare Other | Admitting: Family Medicine

## 2018-08-31 ENCOUNTER — Encounter: Payer: Medicare Other | Admitting: Registered Nurse

## 2018-09-01 ENCOUNTER — Ambulatory Visit: Payer: Medicare Other | Admitting: Registered Nurse

## 2018-09-01 ENCOUNTER — Encounter: Payer: Medicare Other | Admitting: Physical Medicine & Rehabilitation

## 2018-09-02 ENCOUNTER — Encounter: Payer: Medicare Other | Attending: Physical Medicine & Rehabilitation | Admitting: Physical Medicine & Rehabilitation

## 2018-09-02 ENCOUNTER — Encounter: Payer: Self-pay | Admitting: Physical Medicine & Rehabilitation

## 2018-09-02 ENCOUNTER — Other Ambulatory Visit: Payer: Self-pay

## 2018-09-02 VITALS — BP 103/58 | HR 67 | Ht 64.0 in | Wt 146.6 lb

## 2018-09-02 DIAGNOSIS — Z79899 Other long term (current) drug therapy: Secondary | ICD-10-CM | POA: Diagnosis not present

## 2018-09-02 DIAGNOSIS — M1712 Unilateral primary osteoarthritis, left knee: Secondary | ICD-10-CM

## 2018-09-02 DIAGNOSIS — Z5181 Encounter for therapeutic drug level monitoring: Secondary | ICD-10-CM | POA: Insufficient documentation

## 2018-09-02 DIAGNOSIS — M797 Fibromyalgia: Secondary | ICD-10-CM | POA: Diagnosis not present

## 2018-09-02 DIAGNOSIS — R2689 Other abnormalities of gait and mobility: Secondary | ICD-10-CM | POA: Insufficient documentation

## 2018-09-02 DIAGNOSIS — M1711 Unilateral primary osteoarthritis, right knee: Secondary | ICD-10-CM | POA: Diagnosis not present

## 2018-09-02 MED ORDER — OXYCODONE-ACETAMINOPHEN 10-325 MG PO TABS: 1.0000 | ORAL_TABLET | Freq: Four times a day (QID) | ORAL | 0 refills | Status: DC | PRN

## 2018-09-02 NOTE — Patient Instructions (Signed)
PLEASE FEEL FREE TO CALL OUR OFFICE WITH ANY PROBLEMS OR QUESTIONS ([phone])      

## 2018-09-02 NOTE — Progress Notes (Signed)
PROCEDURE NOTE  DIAGNOSIS: Bilaeral OA of knees  PROCEDURE NOTE  DIAGNOSIS:    INTERVENTION:   ZILRETTA INJECTION (2)     After informed consent and preparation of the skin with betadine and isopropyl alcohol, I injected 32MG  of zilretta dissolved into 5cc of diluent into bilateral knees via anterolateral approach. Contents of syring were shaken vigorously and aspiration was performed prior to injection. The patient tolerated well, and no complications were encountered. Afterward the area was cleaned and dressed. Post- injection instructions were provided including ice  if swelling or pain should occur.     F/u with NP in about a month.   Percocet was refilled today. We will continue the controlled substance monitoring program, this consists of regular clinic visits, examinations, routine drug screening, pill counts as well as use of [place] Controlled Substance Reporting System. [place].    Meredith Staggers, MD, [facility] 09/02/2018

## 2018-09-04 ENCOUNTER — Ambulatory Visit (INDEPENDENT_AMBULATORY_CARE_PROVIDER_SITE_OTHER): Payer: Medicare Other | Admitting: Family Medicine

## 2018-09-04 ENCOUNTER — Encounter: Payer: Self-pay | Admitting: Family Medicine

## 2018-09-04 VITALS — BP 132/70 | HR 63 | Temp 98.4°F | Resp 16 | Ht 64.0 in | Wt 144.4 lb

## 2018-09-04 DIAGNOSIS — I959 Hypotension, unspecified: Secondary | ICD-10-CM

## 2018-09-04 DIAGNOSIS — I10 Essential (primary) hypertension: Secondary | ICD-10-CM | POA: Diagnosis not present

## 2018-09-04 DIAGNOSIS — I2510 Atherosclerotic heart disease of native coronary artery without angina pectoris: Secondary | ICD-10-CM | POA: Diagnosis not present

## 2018-09-04 DIAGNOSIS — Z23 Encounter for immunization: Secondary | ICD-10-CM

## 2018-09-04 MED ORDER — METOPROLOL SUCCINATE ER 50 MG PO TB24: ORAL_TABLET | ORAL | 3 refills | Status: DC

## 2018-09-04 NOTE — Patient Instructions (Signed)
Check blood pressure every morning.  If <110/60 or if >160/100, recheck your blood pressure about 2 hours later.

## 2018-09-04 NOTE — Progress Notes (Signed)
OFFICE VISIT  09/16/2018   CC:  Chief Complaint  Patient presents with  . Follow-up    HTN   HPI:    Patient is a 72 y.o. Caucasian female who presents for 2 wk f/u HTN. Last visit I increased her toprol xl to 50mg  and had her take the whole tab in the morning.  BPs still fluctuating: always check bp in mornings.   lowest 100/50, yesterday around 11 AM.  At times up to 180-190 over 110.  HR 80s. Feels lightheaded with low bp, some SOB and malaise with elevated bp.  Also feels like her heart is irregular during these times.  Says she is drinking fluids well, but is not a great eater.  Recent UTI sx's resolved.  ROS: no CP. no HAs, no rashes, no melena/hematochezia.  No polyuria or polydipsia. No fevers.   Past Medical History:  Diagnosis Date  . Acute upper GI bleed 06/2014   while pt taking coumadin, plavix, and meloxicam---despite being told not to take coumadin.  . Anginal pain ([place])    Nonobstructive CAD 2014; however, her cardiologist put her on a statin for this and NOT for hyperlipidemia per pt report.  Atyp CP 08/2017 at card f/u, plan for myoc perf imaging.  . Anxiety    panic attacks  . Asthma    w/ asbestososis   . BPPV (benign paroxysmal positional vertigo) 12/16/2012  . Chronic diastolic CHF (congestive heart failure) (HCC)    dry wt as of 11/06/16 is 168 lbs.  . Chronic lower back pain   . COPD (chronic obstructive pulmonary disease) ([place])   . DDD (degenerative disc disease)    lumbar and cervical.   . Diverticular disease   . Fibromyalgia    Patient states dx was around her late 23s but she had sx's for years prior to this.  . H/O hiatal hernia   . Hay fever   . History of pneumonia    hospitalized 12/2011, 02/2013, and 07/2013 [facility] [place] [facility]) for this  . HTN (hypertension)    Renal artery dopplers 04/2013 neg for stenosis.  . Idiopathic angio-edema-urticaria 72014   Angioedema component was very minimal  . Insomnia   . Iron deficiency anemia    Hematologist in  [place], [place] did extensive w/u; no cause found; failed oral supplement;; gets fairly regular (q6m or so) IV iron infusions (Venofer -iron sucrose- 200mg  with procrit.  "for 14 yr I've been getting blood work q month & getting infusions prn" (07/12/2013).  Dr. Marin Olp locally, iron infusions done, EPO deficiency dx'd  . Migraine syndrome    "not as often anymore; used to be ~ q wk" (07/12/2013)  . Mixed incontinence urge and stress   . Nephrolithiasis    "passed all on my own or they are still in there" (07/12/2013)  . Neuroleptic induced Parkinsonism ([place]) 2018   Dr. Carles Collet, neuro, saw her 11/24/17 and recommended d/c of abilify as first step.  D/c'd abilify and pt got complete recovery.  . OSA on CPAP    prior to move to [place]--had another sleep study 10/2015 w/pulm Dr. Camillo Flaming.  . Osteoarthritis    "severe; progressing fast" (07/12/2013); multiple joints-not surgical candidate for TKR (03/2015).  Triamcinolon knee injections by Dr. Tessa Lerner 12/2017.  [name] Kitchen Pernicious anemia 08/24/2014  . Pleural plaque with presence of asbestos 07/22/2013  . Pulmonary embolism ([place]) 07/2013   Dx at [facility] with very small peripheral upper lobe pe 07/2013: pt took coumadin for about 8-9 mo  .  Pyelonephritis    "several times over the last 30 yr" (07/12/2013)  . RBBB (right bundle branch block)   . Recurrent major depression ([place])   . Recurrent UTI    hx of hospitalization for pyelonephritis; started abx prophylaxis 06/2015  . Syncope    Hypotensive; ED visit--Dr. Terrence Dupont did Cath--nonobstructive CAD, EF 55-60%.  In retrospect, suspect pt rx med misuse/polypharmacy  . Tension headache, chronic     Past Surgical History:  Procedure Laterality Date  . APPENDECTOMY  1960  . AXILLARY SURGERY Left 1978   Multiple "lump" in armpit per pt  . CARDIAC CATHETERIZATION  01/2013   nonobstructive CAD, EF 55-60%  . CARDIOVASCULAR STRESS TEST  02/22/15   Low risk myocard perf imaging; wall motion normal, normal EF  . carotid duplex doppler   10/21/2017   R vertebral flow suggestive of possible distal obstruction.  Pt declined further w/u as of 10/29/17 but need to revisit this problem periodically.  . COCCYX REMOVAL  1972  . DEXA  06/05/2017   T-score -3.1  . DILATION AND CURETTAGE OF UTERUS  ? 1970's  . ESOPHAGOGASTRODUODENOSCOPY N/A 07/19/2014   Gastritis found + in the setting of supratherapeutic INR, +plavix, + meloxicam.  . EYE SURGERY Left 2012-2013   "injections for ~ 1 yr; don't really know what for" (07/12/2013)  . HEEL SPUR SURGERY Left 2008  . KNEE SURGERY  2005  . LEFT HEART CATHETERIZATION WITH CORONARY ANGIOGRAM N/A 01/30/2013   Procedure: LEFT HEART CATHETERIZATION WITH CORONARY ANGIOGRAM;  Surgeon: Clent Demark, MD;  Location: [facility] CATH LAB;  Service: Cardiovascular;  Laterality: N/A;  . PLANTAR FASCIA RELEASE Left 2008  . SPIROMETRY  04/25/14   In hosp for acute asthma/COPD flare: mixed obstructive and restrictive lung disease. The FEV1 is severely reduced at 45% predicted.  FEV1 signif decreased compared to prior spirometry 07/23/13.  . TENDON RELEASE  1996   Right forearm and hand  . TOTAL ABDOMINAL HYSTERECTOMY  1974  . TRANSTHORACIC ECHOCARDIOGRAM  01/2013; 04/2014;08/2015; 09/2017   2014--NORMAL.  2015--focal basal septal hypertrophy, EF 55-60%, grade I diast dysfxn, mild LAE.  08/2015 EF 55-60%, nl LV syst fxn, grade I DD, valves wnl. 10/21/17: EF 65-70%, grd I DD, o/w normal.    Outpatient Medications Prior to Visit  Medication Sig Dispense Refill  . albuterol (PROVENTIL HFA;VENTOLIN HFA) 108 (90 Base) MCG/ACT inhaler Inhale 2 puffs into the lungs every 6 (six) hours as needed for wheezing or shortness of breath. 1 Inhaler 1  . alendronate (FOSAMAX) 70 MG tablet Take 1 tablet (70 mg total) by mouth every 7 (seven) days. Take with a full glass of water on an empty stomach, first thing in the morning and remain up right for 30 minutes after taking. 12 tablet 3  . ALPRAZolam (XANAX) 1 MG tablet TAKE 1 TABLET THREE  TIMES DAILY AS NEEDED FOR ANXIETY. 90 tablet 5  . aspirin 81 MG tablet Take 81 mg by mouth at bedtime.    . diclofenac sodium (VOLTAREN) 1 % GEL Apply 2 g topically 3 (three) times daily as needed (pain). 2 Tube 2  . DULoxetine (CYMBALTA) 30 MG capsule TAKE 1 CAPSULE ONCE DAILY ALONG WITH 60MG  CAPSULE FOR TOTAL 90MG  DAILY. 30 capsule 5  . DULoxetine (CYMBALTA) 60 MG capsule TAKE 1 CAPSULE A DAY TO BE COMBINED WITH 30MG  CAPSULE 90 capsule 1  . fluticasone (FLONASE) 50 MCG/ACT nasal spray USE 2 SPRAYS IN EACH NOSTRIL ONCE DAILY 16 g 5  . furosemide (  LASIX) 40 MG tablet Take 1 tablet (40 mg total) by mouth daily. (Patient taking differently: Take 40 mg by mouth as needed for fluid. ) 60 tablet 11  . lamoTRIgine (LAMICTAL) 100 MG tablet TAKE 1 TABLET EACH DAY. 90 tablet 3  . metroNIDAZOLE (METROGEL) 0.75 % gel Apply 1 application topically 2 (two) times daily. 45 g 3  . Multiple Vitamin (MULTIVITAMIN WITH MINERALS) TABS tablet Take 1 tablet by mouth daily.    . nitroGLYCERIN (NITROSTAT) 0.4 MG SL tablet Place 1 tablet (0.4 mg total) under the tongue every 5 (five) minutes as needed for chest pain (x 3 doses). 25 tablet 3  . oxyCODONE-acetaminophen (PERCOCET) 10-325 MG tablet Take 1 tablet by mouth every 6 (six) hours as needed for pain. 100 tablet 0  . OXYGEN Inhale 3 L into the lungs continuous.    . traZODone (DESYREL) 50 MG tablet TAKE 2-4 TABLETS AT BEDTIME 120 tablet 6  . Vitamin D, Ergocalciferol, (DRISDOL) 50000 units CAPS capsule 1 tab po TWICE per week 24 capsule 3  . isosorbide mononitrate (IMDUR) 30 MG 24 hr tablet TAKE 2 TABLETS IN THE AM AND 1 TABLET IN THE PM. 90 tablet 2  . metoprolol succinate (TOPROL-XL) 50 MG 24 hr tablet 1 tab po qAM 90 tablet 3  . pantoprazole (PROTONIX) 40 MG tablet TAKE 1 TABLET BY MOUTH DAILY. 90 tablet 1   Facility-Administered Medications Prior to Visit  Medication Dose Route Frequency Provider Last Rate Last Dose  . ferumoxytol (FERAHEME) 510 mg in sodium  chloride 0.9 % 100 mL IVPB  510 mg Intravenous Once Volanda Napoleon, MD        Allergies  Allergen Reactions  . Abilify [Aripiprazole] Other (See Comments)    parkinsonism  . Penicillins Itching, Swelling and Rash    Tolerated Cefepime in ED. Has patient had a PCN reaction causing immediate rash, facial/tongue/throat swelling, SOB or lightheadedness with hypotension: Yes Has patient had a PCN reaction causing severe rash involving mucus membranes or skin necrosis: No Has patient had a PCN reaction that required hospitalization: No  Has patient had a PCN reaction occurring within the last 10 years: No     ROS As per HPI  PE: Blood pressure 132/70, pulse 63, temperature 98.4 F (36.9 C), temperature source Oral, resp. rate 16, height 5\' 4"  (1.626 m), weight 144 lb 6 oz (65.5 kg), SpO2 96 %. Gen: Alert, well appearing.  Patient is oriented to person, place, time, and situation.  Gen: Alert, well appearing.  Patient is oriented to person, place, time, and situation. AFFECT: pleasant, lucid thought and speech. CV: RRR, no m/r/g.   LUNGS: CTA bilat, nonlabored resps, good aeration in all lung fields.   LABS:    Chemistry      Component Value Date/Time   NA 140 08/07/2018 1429   NA 141 06/05/2015 1315   K 4.3 08/07/2018 1429   K 3.9 06/05/2015 1315   CL 101 08/07/2018 1429   CL 103 06/05/2015 1315   CO2 32 08/07/2018 1429   CO2 28 06/05/2015 1315   BUN 11 08/07/2018 1429   BUN 14 06/05/2015 1315   CREATININE 0.91 08/07/2018 1429   CREATININE 0.87 02/03/2017 1523      Component Value Date/Time   CALCIUM 9.8 08/07/2018 1429   CALCIUM 9.0 06/05/2015 1315   ALKPHOS 58 01/01/2018 1052   ALKPHOS 84 06/05/2015 1315   AST 29 01/01/2018 1052   AST 23 06/05/2015 1315   ALT 16  01/01/2018 1052   ALT 17 06/05/2015 1315   BILITOT 1.2 01/01/2018 1052   BILITOT 1.00 06/05/2015 1315     Lab Results  Component Value Date   WBC 7.6 08/07/2018   HGB 13.6 08/07/2018   HCT 41.2  08/07/2018   MCV 90.9 08/07/2018   PLT 215.0 08/07/2018    IMPRESSION AND PLAN:  Labile blood pressure.  No changes in regimen, but I told her she could use an extra 1/2 of toprol prn bp >160/100. Further instructions:  Check blood pressure every morning.  If <110/60 or if >160/100, recheck your blood pressure about 2 hours later.  She sees Dr. Jacalyn Lefevre PA in 3d. No labs due today.  An After Visit Summary was printed and given to the patient.  FOLLOW UP: Return in about 2 weeks (around 09/18/2018) for f/u HTN.  Signed:  Crissie Sickles, MD           09/16/2018

## 2018-09-07 ENCOUNTER — Encounter: Payer: Self-pay | Admitting: Physician Assistant

## 2018-09-07 ENCOUNTER — Ambulatory Visit (INDEPENDENT_AMBULATORY_CARE_PROVIDER_SITE_OTHER): Payer: Medicare Other | Admitting: Physician Assistant

## 2018-09-07 VITALS — BP 120/69 | HR 76 | Ht 64.0 in | Wt 145.2 lb

## 2018-09-07 DIAGNOSIS — I10 Essential (primary) hypertension: Secondary | ICD-10-CM | POA: Diagnosis not present

## 2018-09-07 DIAGNOSIS — I2510 Atherosclerotic heart disease of native coronary artery without angina pectoris: Secondary | ICD-10-CM

## 2018-09-07 DIAGNOSIS — I5032 Chronic diastolic (congestive) heart failure: Secondary | ICD-10-CM

## 2018-09-07 MED ORDER — ISOSORBIDE MONONITRATE ER 30 MG PO TB24: ORAL_TABLET | ORAL | 2 refills | Status: DC

## 2018-09-07 NOTE — Patient Instructions (Addendum)
Medication Instructions:  No changes. Refills submitted. If you need a refill on your cardiac medications before your next appointment, please call your pharmacy.  Labwork: None Ordered Please have Primary Care Physician order a lipid profile at appointment.  Testing/Procedures: None Ordered.  Follow-Up: Your physician wants you to follow-up in: 12 months with Dr.Crenshaw. You should receive a reminder letter in the mail two months in advance. If you do not receive a letter, please call our office in July 2020 to schedule.   Thank you for choosing [facility]!!

## 2018-09-07 NOTE — Progress Notes (Signed)
Cardiology Office Note   Date:  09/07/2018   ID:  Linda [name], DOB 1946-03-29, MRN [id number]  PCP:  Tammi Sou, MD  Cardiologist: Dr. Stanford Breed, 02/03/2017 Linda Ferries, PA-C 09/16/2017  Chief Complaint  Patient presents with  . Follow-up  . Shortness of Breath    with activity  . Dizziness    randomly  . Leg Pain    frequently  . Edema    ankles,legs    History of Present Illness: Linda [name] is a 72 y.o. female with a history of BPPV, anxiety and depression,diastolic CHF,asbestosis, COPD on O2 3 lpm, HTN, mod CAD by cath 2014 -med rx, fibromyalgia w/ chronic pain issues. RBBB and inf Q waves on ECG, echo 05/2016 w/ nl EF, grade 1 dd  08/2017 office visit, occasional chest pain, extra Lasix once or twice a week due to edema, stress test echo and Dopplers ordered and Imdur increased  Linda [name] presents for cardiology follow up.  She was having significant MS issues, was falling. MDs thought it was Parkinson's, but it turned out to be side effects from Abilify. She got off it in late January. She has made a miraculous recovery since then.   She is no longer using a walker. Lives at [facility]. She is not over the death of her husband, and giving up her house, but is improving.   She is doing much better now. Injections in her knees allow her to walk w/out a walker.   She has aching in her calves and thighs. It is not exertional. It is relieved by getting up and moving around.   Occasionally has daytime edema, does not wake with it.  Denies orthopnea or PND.  Dyspnea on exertion has improved some.  She has not had recent lipids, prefers her PCP do it.   Her BP has been erratic at times, as low as 70/40. It stayed that low for 3 days and then came up. Her BP is also high at times. If it is over 160/100, she takes another 1/2 metoprolol.    Past Medical History:  Diagnosis Date  . Acute upper GI bleed 06/2014   while pt taking  coumadin, plavix, and meloxicam---despite being told not to take coumadin.  . Anginal pain ([place])    Nonobstructive CAD 2014; however, her cardiologist put her on a statin for this and NOT for hyperlipidemia per pt report.  Atyp CP 08/2017 at card f/u, plan for myoc perf imaging.  . Anxiety    panic attacks  . Asthma    w/ asbestososis   . BPPV (benign paroxysmal positional vertigo) 12/16/2012  . Chronic diastolic CHF (congestive heart failure) (HCC)    dry wt as of 11/06/16 is 168 lbs.  . Chronic lower back pain   . COPD (chronic obstructive pulmonary disease) ([place])   . DDD (degenerative disc disease)    lumbar and cervical.   . Diverticular disease   . Fibromyalgia    Patient states dx was around her late 61s but she had sx's for years prior to this.  . H/O hiatal hernia   . Hay fever   . History of pneumonia    hospitalized 12/2011, 02/2013, and 07/2013 [facility]) for this  . HTN (hypertension)    Renal artery dopplers 04/2013 neg for stenosis.  . Idiopathic angio-edema-urticaria 72014   Angioedema component was very minimal  . Insomnia   . Iron deficiency anemia    Hematologist in [place],  [place] did extensive w/u; no cause found; failed oral supplement;; gets fairly regular (q36m or so) IV iron infusions (Venofer -iron sucrose- 200mg  with procrit.  "for 14 yr I've been getting blood work q month & getting infusions prn" (07/12/2013).  Dr. Marin Olp locally, iron infusions done, EPO deficiency dx'd  . Migraine syndrome    "not as often anymore; used to be ~ q wk" (07/12/2013)  . Mixed incontinence urge and stress   . Nephrolithiasis    "passed all on my own or they are still in there" (07/12/2013)  . Neuroleptic induced Parkinsonism ([place]) 2018   Dr. Carles Collet, neuro, saw her 11/24/17 and recommended d/c of abilify as first step.  D/c'd abilify and pt got complete recovery.  . OSA on CPAP    prior to move to [place]--had another sleep study 10/2015 w/pulm Dr. Camillo Flaming.  . Osteoarthritis    "severe;  progressing fast" (07/12/2013); multiple joints-not surgical candidate for TKR (03/2015).  Triamcinolon knee injections by Dr. Tessa Lerner 12/2017.  [name] Kitchen Pernicious anemia 08/24/2014  . Pleural plaque with presence of asbestos 07/22/2013  . Pulmonary embolism ([place]) 07/2013   Dx at [facility] with very small peripheral upper lobe pe 07/2013: pt took coumadin for about 8-9 mo  . Pyelonephritis    "several times over the last 30 yr" (07/12/2013)  . RBBB (right bundle branch block)   . Recurrent major depression ([place])   . Recurrent UTI    hx of hospitalization for pyelonephritis; started abx prophylaxis 06/2015  . Syncope    Hypotensive; ED visit--Dr. Terrence Dupont did Cath--nonobstructive CAD, EF 55-60%.  In retrospect, suspect pt rx med misuse/polypharmacy  . Tension headache, chronic     Past Surgical History:  Procedure Laterality Date  . APPENDECTOMY  1960  . AXILLARY SURGERY Left 1978   Multiple "lump" in armpit per pt  . CARDIAC CATHETERIZATION  01/2013   nonobstructive CAD, EF 55-60%  . CARDIOVASCULAR STRESS TEST  02/22/15   Low risk myocard perf imaging; wall motion normal, normal EF  . carotid duplex doppler  10/21/2017   R vertebral flow suggestive of possible distal obstruction.  Pt declined further w/u as of 10/29/17 but need to revisit this problem periodically.  . COCCYX REMOVAL  1972  . DEXA  06/05/2017   T-score -3.1  . DILATION AND CURETTAGE OF UTERUS  ? 1970's  . ESOPHAGOGASTRODUODENOSCOPY N/A 07/19/2014   Gastritis found + in the setting of supratherapeutic INR, +plavix, + meloxicam.  . EYE SURGERY Left 2012-2013   "injections for ~ 1 yr; don't really know what for" (07/12/2013)  . HEEL SPUR SURGERY Left 2008  . KNEE SURGERY  2005  . LEFT HEART CATHETERIZATION WITH CORONARY ANGIOGRAM N/A 01/30/2013   Procedure: LEFT HEART CATHETERIZATION WITH CORONARY ANGIOGRAM;  Surgeon: Clent Demark, MD;  Location: [facility] C[facility] LAB;  Service: Cardiovascular;  Laterality: N/A;  . PLANTAR FASCIA RELEASE Left 2008  .  SPIROMETRY  04/25/14   In hosp for acute asthma/COPD flare: mixed obstructive and restrictive lung disease. The FEV1 is severely reduced at 45% predicted.  FEV1 signif decreased compared to prior spirometry 07/23/13.  . TENDON RELEASE  1996   Right forearm and hand  . TOTAL ABDOMINAL HYSTERECTOMY  1974  . TRANSTHORACIC ECHOCARDIOGRAM  01/2013; 04/2014;08/2015; 09/2017   2014--NORMAL.  2015--focal basal septal hypertrophy, EF 55-60%, grade I diast dysfxn, mild LAE.  08/2015 EF 55-60%, nl LV syst fxn, grade I DD, valves wnl. 10/21/17: EF 65-70%, grd I DD, o/w normal.  Current Outpatient Medications  Medication Sig Dispense Refill  . albuterol (PROVENTIL HFA;VENTOLIN HFA) 108 (90 Base) MCG/ACT inhaler Inhale 2 puffs into the lungs every 6 (six) hours as needed for wheezing or shortness of breath. 1 Inhaler 1  . alendronate (FOSAMAX) 70 MG tablet Take 1 tablet (70 mg total) by mouth every 7 (seven) days. Take with a full glass of water on an empty stomach, first thing in the morning and remain up right for 30 minutes after taking. 12 tablet 3  . ALPRAZolam (XANAX) 1 MG tablet TAKE 1 TABLET THREE TIMES DAILY AS NEEDED FOR ANXIETY. 90 tablet 5  . aspirin 81 MG tablet Take 81 mg by mouth at bedtime.    . diclofenac sodium (VOLTAREN) 1 % GEL Apply 2 g topically 3 (three) times daily as needed (pain). 2 Tube 2  . DULoxetine (CYMBALTA) 30 MG capsule TAKE 1 CAPSULE ONCE DAILY ALONG WITH 60MG  CAPSULE FOR TOTAL 90MG  DAILY. 30 capsule 5  . DULoxetine (CYMBALTA) 60 MG capsule TAKE 1 CAPSULE A DAY TO BE COMBINED WITH 30MG  CAPSULE 90 capsule 1  . fluticasone (FLONASE) 50 MCG/ACT nasal spray USE 2 SPRAYS IN EACH NOSTRIL ONCE DAILY 16 g 5  . furosemide (LASIX) 40 MG tablet Take 1 tablet (40 mg total) by mouth daily. (Patient taking differently: Take 40 mg by mouth as needed for fluid. ) 60 tablet 11  . isosorbide mononitrate (IMDUR) 30 MG 24 hr tablet TAKE 2 TABLETS IN THE AM AND 1 TABLET IN THE PM. 90 tablet 2  .  lamoTRIgine (LAMICTAL) 100 MG tablet TAKE 1 TABLET EACH DAY. 90 tablet 3  . metoprolol succinate (TOPROL-XL) 50 MG 24 hr tablet 1 tab po qAM, then take another 1/2-1 tab if bp is >160/100. 90 tablet 3  . metroNIDAZOLE (METROGEL) 0.75 % gel Apply 1 application topically 2 (two) times daily. 45 g 3  . Multiple Vitamin (MULTIVITAMIN WITH MINERALS) TABS tablet Take 1 tablet by mouth daily.    . nitroGLYCERIN (NITROSTAT) 0.4 MG SL tablet Place 1 tablet (0.4 mg total) under the tongue every 5 (five) minutes as needed for chest pain (x 3 doses). 25 tablet 3  . oxyCODONE-acetaminophen (PERCOCET) 10-325 MG tablet Take 1 tablet by mouth every 6 (six) hours as needed for pain. 100 tablet 0  . OXYGEN Inhale 3 L into the lungs continuous.    . pantoprazole (PROTONIX) 40 MG tablet TAKE 1 TABLET BY MOUTH DAILY. 90 tablet 1  . traZODone (DESYREL) 50 MG tablet TAKE 2-4 TABLETS AT BEDTIME 120 tablet 6  . Vitamin D, Ergocalciferol, (DRISDOL) 50000 units CAPS capsule 1 tab po TWICE per week 24 capsule 3   No current facility-administered medications for this visit.    Facility-Administered Medications Ordered in Other Visits  Medication Dose Route Frequency Provider Last Rate Last Dose  . ferumoxytol (FERAHEME) 510 mg in sodium chloride 0.9 % 100 mL IVPB  510 mg Intravenous Once Ennever, Rudell Cobb, MD        Allergies:   Abilify [aripiprazole] and Penicillins    Social History:  The patient  reports that she has never smoked. She has never used smokeless tobacco. She reports that she does not drink alcohol or use drugs.   Family History:  The patient's family history includes Arthritis in her mother; Diabetes in her father and sister; Heart attack in her father and paternal grandmother; Heart disease in her father; Hypertension in her brother, father, and sister; Kidney disease in her mother;  Multiple sclerosis in her son; Stroke in her father.    ROS:  Please see the history of present illness. All other  systems are reviewed and negative.    PHYSICAL EXAM: VS:  BP 120/69   Pulse 76   Ht 5\' 4"  (1.626 m)   Wt 145 lb 3.2 oz (65.9 kg)   BMI 24.92 kg/m  , BMI Body mass index is 24.92 kg/m. GEN: Well nourished, well developed, female in no acute distress  HEENT: normal for age  Neck: no JVD, no carotid bruit, no masses Cardiac: RRR; soft murmur, no rubs, or gallops Respiratory: Decreased breath sounds bases bilaterally, normal work of breathing GI: soft, nontender, nondistended, + BS MS: no deformity or atrophy; no edema; distal pulses are 2+ in all 4 extremities   Skin: warm and dry, no rash Neuro:  Strength and sensation are intact Psych: euthymic mood, full affect   EKG:  EKG is ordered today. The ekg ordered today demonstrates sinus rhythm, heart rate 76, right bundle branch block is old  MYOVIEW: 10/08/2017   The left ventricular ejection fraction is hyperdynamic (>65%).  Nuclear stress EF: 81%.  There was no ST segment deviation noted during stress.  This is a low risk study.  Defect 1: There is a small defect of mild severity present in the mid anteroseptal location.   Low risk stress nuclear study with probable shifting breast attenuation; cannot R/O very mild mid/distal anteroseptal ischemia; EF 81 with normal wall motion. (Results reviewed by Dr. Stanford Breed and felt likely normal, continue medical therapy)   ECHO: 10/21/2017 - Left ventricle: The cavity size was normal. Systolic function was   vigorous. The estimated ejection fraction was in the range of 65%   to 70%. Wall motion was normal; there were no regional wall   motion abnormalities. Doppler parameters are consistent with   abnormal left ventricular relaxation (grade 1 diastolic   dysfunction).  CAROTID DOPPLERS: 10/21/2017 Final Interpretation: Right Carotid: There is evidence in the right ICA of a 1-39% stenosis.  Left Carotid: There is evidence in the left ICA of a 1-39% stenosis.  Vertebrals:  Both vertebral arteries were patent with antegrade flow. High      resistant flow in the right vertebral artery may suggest distal      obstruction.  Recent Labs: 10/21/2017: B Natriuretic Peptide 34.5; TSH 0.745 01/01/2018: ALT 16 01/03/2018: Magnesium 2.1 08/07/2018: BUN 11; Creatinine, Ser 0.91; Hemoglobin 13.6; Platelets 215.0; Potassium 4.3; Sodium 140    Lipid Panel    Component Value Date/Time   CHOL 152 01/31/2013 0500   TRIG 92 01/31/2013 0500   HDL 44 01/31/2013 0500   CHOLHDL 3.5 01/31/2013 0500   VLDL 18 01/31/2013 0500   LDLCALC 90 01/31/2013 0500     Wt Readings from Last 3 Encounters:  09/07/18 145 lb 3.2 oz (65.9 kg)  09/04/18 144 lb 6 oz (65.5 kg)  09/02/18 146 lb 9.6 oz (66.5 kg)     Other studies Reviewed: Additional studies/ records that were reviewed today include: Office notes, hospital records and testing.  ASSESSMENT AND PLAN:  1.  Chronic diastolic CHF: Her weight is down 10 pounds from a year ago.  Her volume status is good by exam.  She is on Lasix 40 mg a day.  Continue this and watch for volume overload.  Labs are followed by her PCP.  2.  History of nonobstructive coronary artery disease by remote cath: Her stress test a year ago was without  ischemia and EF is normal.  She is taking Imdur 60 mg a.m. and 30 mg p.m., continue this.  Continue metoprolol at the current dosing.  Okay to take an extra half or whole tablet of metoprolol as needed for hypertension.  Continue baby aspirin.  3.  Hypertension: I explained that some people have hypertension with a large range between the highest readings and the lowest readings.  I explained the concept of permissive hypertension.  Her blood pressure is good today.  [name] has been following her carefully and adjusting meds as needed.   Current medicines are reviewed at length with the patient today.  The patient has concerns regarding medicines.  Concerns were addressed  The following  changes have been made: None  Labs/ tests ordered today include:   Orders Placed This Encounter  Procedures  . EKG 12-Lead     Disposition:   FU with Dr. Stanford Breed in a year  Signed, Linda Ferries, PA-C  09/07/2018 1:20 PM    [place] Phone: [phone]; Fax: [phone]  This note was written with the assistance of speech recognition software. Please excuse any transcriptional errors.

## 2018-09-10 ENCOUNTER — Other Ambulatory Visit: Payer: Self-pay | Admitting: Family Medicine

## 2018-09-22 ENCOUNTER — Encounter: Payer: Self-pay | Admitting: Family Medicine

## 2018-09-22 ENCOUNTER — Ambulatory Visit (INDEPENDENT_AMBULATORY_CARE_PROVIDER_SITE_OTHER): Payer: Medicare Other | Admitting: Family Medicine

## 2018-09-22 VITALS — BP 104/65 | HR 70 | Temp 98.4°F | Resp 16 | Ht 64.0 in | Wt 143.5 lb

## 2018-09-22 DIAGNOSIS — I2510 Atherosclerotic heart disease of native coronary artery without angina pectoris: Secondary | ICD-10-CM | POA: Diagnosis not present

## 2018-09-22 DIAGNOSIS — E7800 Pure hypercholesterolemia, unspecified: Secondary | ICD-10-CM

## 2018-09-22 DIAGNOSIS — I10 Essential (primary) hypertension: Secondary | ICD-10-CM | POA: Diagnosis not present

## 2018-09-22 LAB — BASIC METABOLIC PANEL
BUN: 14 mg/dL (ref 6–23)
CHLORIDE: 103 meq/L (ref 96–112)
CO2: 30 mEq/L (ref 19–32)
Calcium: 8.9 mg/dL (ref 8.4–10.5)
Creatinine, Ser: 0.85 mg/dL (ref 0.40–1.20)
GFR: 69.78 mL/min (ref >60.00)
GLUCOSE: 68 mg/dL — AB (ref 70–99)
POTASSIUM: 3.9 meq/L (ref 3.5–5.1)
Sodium: 140 mEq/L (ref 135–145)

## 2018-09-22 LAB — LIPID PANEL
Cholesterol: 172 mg/dL (ref 0–200)
HDL: 43.7 mg/dL (ref >39.00)
LDL CALC: 92 mg/dL (ref 0–99)
NonHDL: 128.76
Total CHOL/HDL Ratio: 4
Triglycerides: 184 mg/dL — ABNORMAL HIGH (ref 0.0–149.0)
VLDL: 36.8 mg/dL (ref 0.0–40.0)

## 2018-09-22 LAB — TSH: TSH: 1.15 u[IU]/mL (ref 0.35–4.50)

## 2018-09-22 NOTE — Progress Notes (Signed)
OFFICE VISIT  09/22/2018   CC:  Chief Complaint  Patient presents with  . Follow-up    HTN, pt is not fasting.   HPI:    Patient is a 72 y.o. Caucasian female who presents for 2 week f/u HTN.  Bp's fluctuating still but not as much.  Lowest systolic 497.  Highest systolic 026V. Many normal bp's as well. No CP or palpitations.  She continues to improve in functioning since her neuroleptic induced parkinsonism has resolved. Even has more stamina walking, less DOE, far less need for use of O2 in daytime.   Past Medical History:  Diagnosis Date  . Acute upper GI bleed 06/2014   while pt taking coumadin, plavix, and meloxicam---despite being told not to take coumadin.  . Anginal pain ([place])    Nonobstructive CAD 2014; however, her cardiologist put her on a statin for this and NOT for hyperlipidemia per pt report.  Atyp CP 08/2017 at card f/u, plan for myoc perf imaging.  . Anxiety    panic attacks  . Asthma    w/ asbestososis   . BPPV (benign paroxysmal positional vertigo) 12/16/2012  . Chronic diastolic CHF (congestive heart failure) (HCC)    dry wt as of 11/06/16 is 168 lbs.  . Chronic lower back pain   . COPD (chronic obstructive pulmonary disease) ([place])   . DDD (degenerative disc disease)    lumbar and cervical.   . Diverticular disease   . Fibromyalgia    Patient states dx was around her late 15s but she had sx's for years prior to this.  . H/O hiatal hernia   . Hay fever   . History of pneumonia    hospitalized 12/2011, 02/2013, and 07/2013 [facility]) for this  . HTN (hypertension)    Renal artery dopplers 04/2013 neg for stenosis.  . Idiopathic angio-edema-urticaria 72014   Angioedema component was very minimal  . Insomnia   . Iron deficiency anemia    Hematologist in [place], [place] did extensive w/u; no cause found; failed oral supplement;; gets fairly regular (q14m or so) IV iron infusions (Venofer -iron sucrose- 200mg  with procrit.  "for 14 yr I've been getting blood work  q month & getting infusions prn" (07/12/2013).  Dr. Marin Olp locally, iron infusions done, EPO deficiency dx'd  . Migraine syndrome    "not as often anymore; used to be ~ q wk" (07/12/2013)  . Mixed incontinence urge and stress   . Nephrolithiasis    "passed all on my own or they are still in there" (07/12/2013)  . Neuroleptic induced Parkinsonism ([place]) 2018   Dr. Carles Collet, neuro, saw her 11/24/17 and recommended d/c of abilify as first step.  D/c'd abilify and pt got complete recovery.  . OSA on CPAP    prior to move to [place]--had another sleep study 10/2015 w/pulm Dr. Camillo Flaming.  . Osteoarthritis    "severe; progressing fast" (07/12/2013); multiple joints-not surgical candidate for TKR (03/2015).  Triamcinolon knee injections by Dr. Tessa Lerner 12/2017.  [name] Kitchen Pernicious anemia 08/24/2014  . Pleural plaque with presence of asbestos 07/22/2013  . Pulmonary embolism ([place]) 07/2013   Dx at [facility] with very small peripheral upper lobe pe 07/2013: pt took coumadin for about 8-9 mo  . Pyelonephritis    "several times over the last 30 yr" (07/12/2013)  . RBBB (right bundle branch block)   . Recurrent major depression ([place])   . Recurrent UTI    hx of hospitalization for pyelonephritis; started abx prophylaxis 06/2015  . Syncope  Hypotensive; ED visit--Dr. Terrence Dupont did Cath--nonobstructive CAD, EF 55-60%.  In retrospect, suspect pt rx med misuse/polypharmacy  . Tension headache, chronic     Past Surgical History:  Procedure Laterality Date  . APPENDECTOMY  1960  . AXILLARY SURGERY Left 1978   Multiple "lump" in armpit per pt  . CARDIAC CATHETERIZATION  01/2013   nonobstructive CAD, EF 55-60%  . CARDIOVASCULAR STRESS TEST  02/22/15   Low risk myocard perf imaging; wall motion normal, normal EF  . carotid duplex doppler  10/21/2017   R vertebral flow suggestive of possible distal obstruction.  Pt declined further w/u as of 10/29/17 but need to revisit this problem periodically.  . COCCYX REMOVAL  1972  . DEXA  06/05/2017    T-score -3.1  . DILATION AND CURETTAGE OF UTERUS  ? 1970's  . ESOPHAGOGASTRODUODENOSCOPY N/A 07/19/2014   Gastritis found + in the setting of supratherapeutic INR, +plavix, + meloxicam.  . EYE SURGERY Left 2012-2013   "injections for ~ 1 yr; don't really know what for" (07/12/2013)  . HEEL SPUR SURGERY Left 2008  . KNEE SURGERY  2005  . LEFT HEART CATHETERIZATION WITH CORONARY ANGIOGRAM N/A 01/30/2013   Procedure: LEFT HEART CATHETERIZATION WITH CORONARY ANGIOGRAM;  Surgeon: Clent Demark, MD;  Location: [facility] CATH LAB;  Service: Cardiovascular;  Laterality: N/A;  . PLANTAR FASCIA RELEASE Left 2008  . SPIROMETRY  04/25/14   In hosp for acute asthma/COPD flare: mixed obstructive and restrictive lung disease. The FEV1 is severely reduced at 45% predicted.  FEV1 signif decreased compared to prior spirometry 07/23/13.  . TENDON RELEASE  1996   Right forearm and hand  . TOTAL ABDOMINAL HYSTERECTOMY  1974  . TRANSTHORACIC ECHOCARDIOGRAM  01/2013; 04/2014;08/2015; 09/2017   2014--NORMAL.  2015--focal basal septal hypertrophy, EF 55-60%, grade I diast dysfxn, mild LAE.  08/2015 EF 55-60%, nl LV syst fxn, grade I DD, valves wnl. 10/21/17: EF 65-70%, grd I DD, o/w normal.    Outpatient Medications Prior to Visit  Medication Sig Dispense Refill  . albuterol (PROVENTIL HFA;VENTOLIN HFA) 108 (90 Base) MCG/ACT inhaler Inhale 2 puffs into the lungs every 6 (six) hours as needed for wheezing or shortness of breath. 1 Inhaler 1  . alendronate (FOSAMAX) 70 MG tablet Take 1 tablet (70 mg total) by mouth every 7 (seven) days. Take with a full glass of water on an empty stomach, first thing in the morning and remain up right for 30 minutes after taking. 12 tablet 3  . ALPRAZolam (XANAX) 1 MG tablet TAKE 1 TABLET THREE TIMES DAILY AS NEEDED FOR ANXIETY. 90 tablet 5  . aspirin 81 MG tablet Take 81 mg by mouth at bedtime.    . diclofenac sodium (VOLTAREN) 1 % GEL Apply 2 g topically 3 (three) times daily as needed (pain). 2  Tube 2  . DULoxetine (CYMBALTA) 30 MG capsule TAKE 1 CAPSULE ONCE DAILY ALONG WITH 60MG  CAPSULE FOR TOTAL 90MG  DAILY. 30 capsule 5  . DULoxetine (CYMBALTA) 60 MG capsule TAKE 1 CAPSULE A DAY TO BE COMBINED WITH 30MG  CAPSULE 90 capsule 1  . fluconazole (DIFLUCAN) 100 MG tablet     . fluticasone (FLONASE) 50 MCG/ACT nasal spray USE 2 SPRAYS IN EACH NOSTRIL ONCE DAILY 16 g 5  . furosemide (LASIX) 40 MG tablet Take 1 tablet (40 mg total) by mouth daily. (Patient taking differently: Take 40 mg by mouth as needed for fluid. ) 60 tablet 11  . isosorbide mononitrate (IMDUR) 30 MG 24 hr tablet  TAKE 2 TABLETS IN THE AM AND 1 TABLET IN THE PM. 90 tablet 2  . lamoTRIgine (LAMICTAL) 100 MG tablet TAKE 1 TABLET EACH DAY. 90 tablet 3  . metoprolol succinate (TOPROL-XL) 50 MG 24 hr tablet 1 tab po qAM, then take another 1/2-1 tab if bp is >160/100. 90 tablet 3  . metroNIDAZOLE (METROGEL) 0.75 % gel Apply 1 application topically 2 (two) times daily. 45 g 3  . Multiple Vitamin (MULTIVITAMIN WITH MINERALS) TABS tablet Take 1 tablet by mouth daily.    . nitroGLYCERIN (NITROSTAT) 0.4 MG SL tablet Place 1 tablet (0.4 mg total) under the tongue every 5 (five) minutes as needed for chest pain (x 3 doses). 25 tablet 3  . oxyCODONE-acetaminophen (PERCOCET) 10-325 MG tablet Take 1 tablet by mouth every 6 (six) hours as needed for pain. 100 tablet 0  . OXYGEN Inhale 3 L into the lungs continuous.    . pantoprazole (PROTONIX) 40 MG tablet TAKE 1 TABLET BY MOUTH DAILY. 90 tablet 1  . traZODone (DESYREL) 50 MG tablet TAKE 2-4 TABLETS AT BEDTIME 120 tablet 6  . Vitamin D, Ergocalciferol, (DRISDOL) 50000 units CAPS capsule 1 tab po TWICE per week 24 capsule 3   Facility-Administered Medications Prior to Visit  Medication Dose Route Frequency Provider Last Rate Last Dose  . ferumoxytol (FERAHEME) 510 mg in sodium chloride 0.9 % 100 mL IVPB  510 mg Intravenous Once Volanda Napoleon, MD        Allergies  Allergen Reactions  .  Abilify [Aripiprazole] Other (See Comments)    parkinsonism  . Penicillins Itching, Swelling and Rash    Tolerated Cefepime in ED. Has patient had a PCN reaction causing immediate rash, facial/tongue/throat swelling, SOB or lightheadedness with hypotension: Yes Has patient had a PCN reaction causing severe rash involving mucus membranes or skin necrosis: No Has patient had a PCN reaction that required hospitalization: No  Has patient had a PCN reaction occurring within the last 10 years: No     ROS As per HPI  PE: Blood pressure 104/65, pulse 70, temperature 98.4 F (36.9 C), temperature source Oral, resp. rate 16, height 5\' 4"  (1.626 m), weight 143 lb 8 oz (65.1 kg), SpO2 95 %.  She walked in our office today w/out cane or walker, walking pretty upright--looking very well!  CV: RRR, no m/r/g.   LUNGS: CTA bilat, nonlabored resps, good aeration in all lung fields.   LABS:   Lab Results  Component Value Date   TSH 0.745 10/21/2017      Chemistry      Component Value Date/Time   NA 140 08/07/2018 1429   NA 141 06/05/2015 1315   K 4.3 08/07/2018 1429   K 3.9 06/05/2015 1315   CL 101 08/07/2018 1429   CL 103 06/05/2015 1315   CO2 32 08/07/2018 1429   CO2 28 06/05/2015 1315   BUN 11 08/07/2018 1429   BUN 14 06/05/2015 1315   CREATININE 0.91 08/07/2018 1429   CREATININE 0.87 02/03/2017 1523      Component Value Date/Time   CALCIUM 9.8 08/07/2018 1429   CALCIUM 9.0 06/05/2015 1315   ALKPHOS 58 01/01/2018 1052   ALKPHOS 84 06/05/2015 1315   AST 29 01/01/2018 1052   AST 23 06/05/2015 1315   ALT 16 01/01/2018 1052   ALT 17 06/05/2015 1315   BILITOT 1.2 01/01/2018 1052   BILITOT 1.00 06/05/2015 1315     Lab Results  Component Value Date   WBC  7.6 08/07/2018   HGB 13.6 08/07/2018   HCT 41.2 08/07/2018   MCV 90.9 08/07/2018   PLT 215.0 08/07/2018   Lab Results  Component Value Date   CHOL 152 01/31/2013   HDL 44 01/31/2013   LDLCALC 90 01/31/2013   TRIG 92  01/31/2013   CHOLHDL 3.5 01/31/2013    IMPRESSION AND PLAN:  HTN, her wide fluctuations in bp's have gradually lessened. She has had opportunity to try taking an extra 1/2 of her toprol for bp >160 syst and this helped. BP excellent here today.  Monitor BMET today. Due for FLP today (she has not eaten in 4 hours). Will also check TSH (due to her labile bp's).  Will forward/cc lab results to her cardiologist.  An After Visit Summary was printed and given to the patient.  FOLLOW UP: Return in about 6 months (around 03/24/2019) for routine chronic illness f/u.  Signed:  Crissie Sickles, MD           09/22/2018

## 2018-09-23 ENCOUNTER — Ambulatory Visit: Payer: Medicare Other | Admitting: Physical Medicine & Rehabilitation

## 2018-09-30 ENCOUNTER — Encounter: Payer: Medicare Other | Attending: Physical Medicine & Rehabilitation | Admitting: Registered Nurse

## 2018-09-30 ENCOUNTER — Encounter: Payer: Self-pay | Admitting: Registered Nurse

## 2018-09-30 VITALS — BP 125/79 | HR 75 | Resp 14 | Ht 64.0 in | Wt 145.0 lb

## 2018-09-30 DIAGNOSIS — M797 Fibromyalgia: Secondary | ICD-10-CM | POA: Diagnosis not present

## 2018-09-30 DIAGNOSIS — Z5181 Encounter for therapeutic drug level monitoring: Secondary | ICD-10-CM

## 2018-09-30 DIAGNOSIS — Z79899 Other long term (current) drug therapy: Secondary | ICD-10-CM | POA: Diagnosis not present

## 2018-09-30 DIAGNOSIS — F329 Major depressive disorder, single episode, unspecified: Secondary | ICD-10-CM | POA: Diagnosis not present

## 2018-09-30 DIAGNOSIS — F411 Generalized anxiety disorder: Secondary | ICD-10-CM | POA: Diagnosis not present

## 2018-09-30 DIAGNOSIS — M5416 Radiculopathy, lumbar region: Secondary | ICD-10-CM | POA: Diagnosis not present

## 2018-09-30 DIAGNOSIS — G894 Chronic pain syndrome: Secondary | ICD-10-CM | POA: Diagnosis not present

## 2018-09-30 DIAGNOSIS — I2510 Atherosclerotic heart disease of native coronary artery without angina pectoris: Secondary | ICD-10-CM | POA: Diagnosis not present

## 2018-09-30 DIAGNOSIS — M1712 Unilateral primary osteoarthritis, left knee: Secondary | ICD-10-CM

## 2018-09-30 DIAGNOSIS — R2689 Other abnormalities of gait and mobility: Secondary | ICD-10-CM | POA: Diagnosis not present

## 2018-09-30 DIAGNOSIS — M1711 Unilateral primary osteoarthritis, right knee: Secondary | ICD-10-CM | POA: Diagnosis not present

## 2018-09-30 MED ORDER — OXYCODONE-ACETAMINOPHEN 10-325 MG PO TABS: 1.0000 | ORAL_TABLET | Freq: Four times a day (QID) | ORAL | 0 refills | Status: DC | PRN

## 2018-09-30 NOTE — Progress Notes (Signed)
Subjective:    Patient ID: Linda [name], female    DOB: 1946-03-27, 72 y.o.   MRN: [id number]  HPI: Linda [name] is a 72 year old old female who returns for follow up appointment for chronic pain and medication refill. She states her pain is located in her lower back and right knee. She rates her pain 7. Also reports several times over the last two weeks her right knee gives out, she denies falling. She doesn't have her walker today, she was instructed to use either her cane or walker at all times. Educated on Falls prevention she verbalizes understanding.   [name] Morphine equivalent is 60.00MME. She is also prescribed Alprazolam by [name] .We have discussed the black box warning of using opioids and benzodiazepines. I highlighted the dangers of using these drugs together and discussed the adverse events including respiratory suppression, overdose, cognitive impairment and importance of compliance with current regimen. We will continue to monitor and adjust as indicated.   Last Oral Swab was Performed on 04/14/2018, it was consistent.   Pain Inventory Average Pain 7 Pain Right Now 7 My pain is constant, sharp, stabbing and aching  In the last 24 hours, has pain interfered with the following? General activity 8 Relation with others 8 Enjoyment of life 8 What TIME of day is your pain at its worst? morning, daytime, eveing Sleep (in general) Fair  Pain is worse with: walking, bending, standing and some activites Pain improves with: rest, medication and injections Relief from Meds: 9  Mobility walk with assistance use a cane use a walker how many minutes can you walk? 10 ability to climb steps?  no do you drive?  yes Do you have any goals in this area?  yes  Function retired I need assistance with the following:  meal prep, household duties and shopping Do you have any goals in this area?  yes  Neuro/Psych bladder control problems weakness trouble  walking spasms anxiety  Prior Studies Any changes since last visit?  no  Physicians involved in your care Any changes since last visit?  no   Family History  Problem Relation Age of Onset  . Arthritis Mother   . Kidney disease Mother   . Heart disease Father   . Stroke Father   . Hypertension Father   . Diabetes Father   . Heart attack Father   . Heart attack Paternal Grandmother   . Diabetes Sister        one sister  . Hypertension Sister   . Hypertension Brother   . Multiple sclerosis Son    Social History   Socioeconomic History  . Marital status: Widowed    Spouse name: Not on file  . Number of children: 2  . Years of education: Not on file  . Highest education level: Not on file  Occupational History  . 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 Financial resource strain: Not on file  . Food insecurity:    Worry: Not on file    Inability: Not on file  . Transportation needs:    Medical: Not on file    Non-medical: Not on file  Tobacco Use  . Smoking status: Never Smoker  . Smokeless tobacco: Never Used  . Tobacco comment: never used tobacco  Substance and Sexual Activity  . Alcohol use: No    Alcohol/week: 0.0 standard drinks  . Drug use: No  . Sexual  activity: Not Currently  Lifestyle  . Physical activity:    Days per week: Not on file    Minutes per session: Not on file  . Stress: Not on file  Relationships  . Social connections:    Talks on phone: Not on file    Gets together: Not on file    Attends religious service: Not on file    Active member of club or organization: Not on file    Attends meetings of clubs or organizations: Not on file    Relationship status: Not on file  Other Topics Concern  . Not on file  Social History Narrative   Widowed, 2 sons.  Relocated to [place] 09/2012 to be closer to her son who has MS.   Husband d 2015--mesothelioma.   Occupation: former Pharmacist, hospital.   Education: masters degree  level.   No T/A/Ds.   Lives in [place], independent living.   Past Surgical History:  Procedure Laterality Date  . APPENDECTOMY  1960  . AXILLARY SURGERY Left 1978   Multiple "lump" in armpit per pt  . CARDIAC CATHETERIZATION  01/2013   nonobstructive CAD, EF 55-60%  . CARDIOVASCULAR STRESS TEST  02/22/15   Low risk myocard perf imaging; wall motion normal, normal EF  . carotid duplex doppler  10/21/2017   R vertebral flow suggestive of possible distal obstruction.  Pt declined further w/u as of 10/29/17 but need to revisit this problem periodically.  . COCCYX REMOVAL  1972  . DEXA  06/05/2017   T-score -3.1  . DILATION AND CURETTAGE OF UTERUS  ? 1970's  . ESOPHAGOGASTRODUODENOSCOPY N/A 07/19/2014   Gastritis found + in the setting of supratherapeutic INR, +plavix, + meloxicam.  . EYE SURGERY Left 2012-2013   "injections for ~ 1 yr; don't really know what for" (07/12/2013)  . HEEL SPUR SURGERY Left 2008  . KNEE SURGERY  2005  . LEFT HEART CATHETERIZATION WITH CORONARY ANGIOGRAM N/A 01/30/2013   Procedure: LEFT HEART CATHETERIZATION WITH CORONARY ANGIOGRAM;  Surgeon: Clent Demark, MD;  Location: [facility] CATH LAB;  Service: Cardiovascular;  Laterality: N/A;  . PLANTAR FASCIA RELEASE Left 2008  . SPIROMETRY  04/25/14   In hosp for acute asthma/COPD flare: mixed obstructive and restrictive lung disease. The FEV1 is severely reduced at 45% predicted.  FEV1 signif decreased compared to prior spirometry 07/23/13.  . TENDON RELEASE  1996   Right forearm and hand  . TOTAL ABDOMINAL HYSTERECTOMY  1974  . TRANSTHORACIC ECHOCARDIOGRAM  01/2013; 04/2014;08/2015; 09/2017   2014--NORMAL.  2015--focal basal septal hypertrophy, EF 55-60%, grade I diast dysfxn, mild LAE.  08/2015 EF 55-60%, nl LV syst fxn, grade I DD, valves wnl. 10/21/17: EF 65-70%, grd I DD, o/w normal.   Past Medical History:  Diagnosis Date  . Acute upper GI bleed 06/2014   while pt taking coumadin, plavix, and meloxicam---despite being  told not to take coumadin.  . Anginal pain ([place])    Nonobstructive CAD 2014; however, her cardiologist put her on a statin for this and NOT for hyperlipidemia per pt report.  Atyp CP 08/2017 at card f/u, plan for myoc perf imaging.  . Anxiety    panic attacks  . Asthma    w/ asbestososis   . BPPV (benign paroxysmal positional vertigo) 12/16/2012  . Chronic diastolic CHF (congestive heart failure) (HCC)    dry wt as of 11/06/16 is 168 lbs.  . Chronic lower back pain   . COPD (chronic obstructive pulmonary disease) ([place])   .  DDD (degenerative disc disease)    lumbar and cervical.   . Diverticular disease   . Fibromyalgia    Patient states dx was around her late 64s but she had sx's for years prior to this.  . H/O hiatal hernia   . Hay fever   . History of pneumonia    hospitalized 12/2011, 02/2013, and 07/2013 [facility]) for this  . HTN (hypertension)    Renal artery dopplers 04/2013 neg for stenosis.  . Idiopathic angio-edema-urticaria 72014   Angioedema component was very minimal  . Insomnia   . Iron deficiency anemia    Hematologist in [place], [place] did extensive w/u; no cause found; failed oral supplement;; gets fairly regular (q82m or so) IV iron infusions (Venofer -iron sucrose- 200mg  with procrit.  "for 14 yr I've been getting blood work q month & getting infusions prn" (07/12/2013).  Dr. [place] Olp locally, iron infusions done, EPO deficiency dx'd  . Migraine syndrome    "not as often anymore; used to be ~ q wk" (07/12/2013)  . Mixed incontinence urge and stress   . Nephrolithiasis    "passed all on my own or they are still in there" (07/12/2013)  . Neuroleptic induced Parkinsonism ([place]) 2018   Dr. Carles Collet, neuro, saw her 11/24/17 and recommended d/c of abilify as first step.  D/c'd abilify and pt got complete recovery.  . OSA on CPAP    prior to move to [place]--had another sleep study 10/2015 w/pulm Dr. Camillo Flaming.  . Osteoarthritis    "severe; progressing fast" (07/12/2013); multiple joints-not  surgical candidate for TKR (03/2015).  Triamcinolon knee injections by Dr. Tessa Lerner 12/2017.  [name] Kitchen Pernicious anemia 08/24/2014  . Pleural plaque with presence of asbestos 07/22/2013  . Pulmonary embolism ([place]) 07/2013   Dx at [facility] with very small peripheral upper lobe pe 07/2013: pt took coumadin for about 8-9 mo  . Pyelonephritis    "several times over the last 30 yr" (07/12/2013)  . RBBB (right bundle branch block)   . Recurrent major depression ([place])   . Recurrent UTI    hx of hospitalization for pyelonephritis; started abx prophylaxis 06/2015  . Syncope    Hypotensive; ED visit--Dr. Terrence Dupont did Cath--nonobstructive CAD, EF 55-60%.  In retrospect, suspect pt rx med misuse/polypharmacy  . Tension headache, chronic    BP 125/79   Pulse 75   Resp 14   Ht 5\' 4"  (1.626 m)   Wt 145 lb (65.8 kg)   SpO2 91%   BMI 24.89 kg/m   Opioid Risk Score:   Fall Risk Score:  `1  Depression screen PHQ 2/9  Depression screen [facility] 2/9 09/02/2018 07/01/2018 05/27/2018 05/19/2018 03/11/2018 02/11/2018 01/14/2018  Decreased Interest 1 1 1  0 1 1 0  Down, Depressed, Hopeless 1 1 1  0 1 1 0  PHQ - 2 Score 2 2 2  0 2 2 0  Altered sleeping - - 0 - - - -  Tired, decreased energy - - 1 - - - -  Change in appetite - - 0 - - - -  Feeling bad or failure about yourself  - - 0 - - - -  Trouble concentrating - - 0 - - - -  Moving slowly or fidgety/restless - - 0 - - - -  Suicidal thoughts - - 0 - - - -  PHQ-9 Score - - 3 - - - -  Difficult doing work/chores - - Not difficult at all - - - -  Some recent data might be hidden  Review of Systems  Constitutional: Negative.   HENT: Negative.   Eyes: Negative.   Respiratory: Negative.   Cardiovascular: Positive for leg swelling.  Gastrointestinal: Positive for constipation and nausea.  Endocrine: Negative.   Genitourinary: Positive for difficulty urinating.  Musculoskeletal: Positive for arthralgias, back pain and gait problem.       Spasms   Allergic/Immunologic:  Negative.   Neurological: Positive for weakness.  Hematological: Negative.   Psychiatric/Behavioral: The patient is nervous/anxious.   All other systems reviewed and are negative.      Objective:   Physical Exam  Constitutional: She is oriented to person, place, and time. She appears well-developed and well-nourished.  HENT:  Head: Normocephalic and atraumatic.  Neck: Normal range of motion. Neck supple.  Cervical Paraspinal Tenderness: C-5-C-6  Cardiovascular: Normal rate and regular rhythm.  Pulmonary/Chest: Effort normal and breath sounds normal.  Musculoskeletal:  Normal Muscle Bulk and Muscle Testing Reveals:  Upper Extremities: Full ROM and Muscle Strength 5/5 Bilateral AC Joint Tenderness Lumbar Paraspinal Tenderness: L-3-L-5  Lower Extremities: Full ROM and Muscle Strength 5/5 Arises from chair with ease  Narrow Based Gait   Neurological: She is alert and oriented to person, place, and time.  Skin: Skin is warm and dry.  Psychiatric: She has a normal mood and affect. Her behavior is normal.  Nursing note and vitals reviewed.         Assessment & Plan:  1. Functional deficits secondary to Gait disorder:Continue with HEP.09/30/2018 2. Chronic Back pain/ Lumbar Radiculitis/fibromyalgia /R>L Knee OA Pain Management: 09/30/2018 Refilled: Oxycodone 10/325mg one tablet every 6 hours #100 and Voltaren Gel We will continue the opioid monitoring program, this consists of regular clinic visits, examinations, urine drug screen, pill counts as well as use of [place] Controlled Substance reporting System. 3. Depression with anxiety/Grief reaction/Mood: 09/30/2018 Continue Xanax,Trazodone and Cymbalta .  4. [name] with asthma:. Albuterol prn. :Pulmonology Following. 09/30/2018 5. Bilateral Osteoarthritis of Bilateral Knees: S/P Zilretta Injectionon 09/11/2019with Good Relief Noted. Dr. Juanda Bond Orthopedist.. 09/30/2018 6. Fibromyalgia: Continue Home exercise  regime. 09/30/2018 7. Cervicalgia:No complaints today.Continue HEP as tolerated. Alternate with heat and ice therapy. Continue to monitor. 09/30/18 8. Bilateral Greater Trochanteric Bursitis:  No complaints Today. Alternate Ice and Heat Therapy. Continue to Monitor.   20 minutes of face to face patient care time was spent during this visit. All questions were encouraged and answered.  F/U in 1 month

## 2018-10-08 ENCOUNTER — Other Ambulatory Visit: Payer: Self-pay | Admitting: Family Medicine

## 2018-10-08 NOTE — Telephone Encounter (Signed)
RF request for alprazolam.  Last OV 09/22/18 Next OV 03/24/19 Last RX 04/13/18 # 90 x 5 RF.  Please advise.

## 2018-10-09 ENCOUNTER — Other Ambulatory Visit: Payer: Self-pay | Admitting: Family Medicine

## 2018-10-09 NOTE — Telephone Encounter (Signed)
RF request for trazodone LOV: 09/22/18 Next ov: 03/24/19 Last written: 02/11/18 #120 w/6RF  Please advise. Thanks.

## 2018-10-26 ENCOUNTER — Encounter: Payer: Self-pay | Admitting: Registered Nurse

## 2018-10-26 ENCOUNTER — Encounter: Payer: Medicare Other | Attending: Physical Medicine & Rehabilitation | Admitting: Registered Nurse

## 2018-10-26 VITALS — BP 124/78 | HR 73 | Resp 14 | Ht 64.0 in | Wt 146.0 lb

## 2018-10-26 DIAGNOSIS — M25512 Pain in left shoulder: Secondary | ICD-10-CM

## 2018-10-26 DIAGNOSIS — Z79899 Other long term (current) drug therapy: Secondary | ICD-10-CM | POA: Diagnosis not present

## 2018-10-26 DIAGNOSIS — M5416 Radiculopathy, lumbar region: Secondary | ICD-10-CM | POA: Diagnosis not present

## 2018-10-26 DIAGNOSIS — F411 Generalized anxiety disorder: Secondary | ICD-10-CM

## 2018-10-26 DIAGNOSIS — G894 Chronic pain syndrome: Secondary | ICD-10-CM | POA: Diagnosis not present

## 2018-10-26 DIAGNOSIS — F329 Major depressive disorder, single episode, unspecified: Secondary | ICD-10-CM

## 2018-10-26 DIAGNOSIS — M1711 Unilateral primary osteoarthritis, right knee: Secondary | ICD-10-CM | POA: Diagnosis not present

## 2018-10-26 DIAGNOSIS — I2510 Atherosclerotic heart disease of native coronary artery without angina pectoris: Secondary | ICD-10-CM

## 2018-10-26 DIAGNOSIS — M25511 Pain in right shoulder: Secondary | ICD-10-CM

## 2018-10-26 DIAGNOSIS — M797 Fibromyalgia: Secondary | ICD-10-CM | POA: Diagnosis not present

## 2018-10-26 DIAGNOSIS — Z5181 Encounter for therapeutic drug level monitoring: Secondary | ICD-10-CM | POA: Diagnosis not present

## 2018-10-26 DIAGNOSIS — M1712 Unilateral primary osteoarthritis, left knee: Secondary | ICD-10-CM

## 2018-10-26 DIAGNOSIS — M7061 Trochanteric bursitis, right hip: Secondary | ICD-10-CM | POA: Diagnosis not present

## 2018-10-26 DIAGNOSIS — M542 Cervicalgia: Secondary | ICD-10-CM | POA: Diagnosis not present

## 2018-10-26 DIAGNOSIS — R2689 Other abnormalities of gait and mobility: Secondary | ICD-10-CM | POA: Insufficient documentation

## 2018-10-26 MED ORDER — OXYCODONE-ACETAMINOPHEN 10-325 MG PO TABS: 1.0000 | ORAL_TABLET | Freq: Four times a day (QID) | ORAL | 0 refills | Status: DC | PRN

## 2018-10-26 NOTE — Progress Notes (Signed)
Subjective:    Patient ID: [name], female    DOB: 10-Feb-1946, 71 y.o.   MRN: [id number]  HPI: [name] is a 72 year old female who returns for follow up appointment for chronic pain and medication refill. She states her pain is located in her neck, bilateral shoulders, lower back pain radiating into his right hip and right lower extremity and right knee pain.  She rates her pain 7. Her current exercise regime is walking.   Ms. Mcguirk walked into clinic, when she laid her bag on banister to be weighed, her bag hit the floor and 14 of her Percocet's fell on the floor, medication was picked up and destroyed per office policy. Spoke with pharmacist and her prescription will be filled today, she verbalizes understanding.   Ms. Innocent Morphine Equivalent is 60.00 MME. She is also prescribed Alprazolam by [name]. We have discussed the black box warning of using opioids and benzodiazepines. I highlighted the dangers of using these drugs together and discussed the adverse events including respiratory suppression, overdose, cognitive impairment and importance of compliance with current regimen. We will continue to monitor and adjust as indicated.   Last Oral Swab was Performed on 04/14/2018, it was consistent.   Pain Inventory Average Pain 6 Pain Right Now 7 My pain is intermittent, sharp, stabbing and aching  In the last 24 hours, has pain interfered with the following? General activity 9 Relation with others 9 Enjoyment of life 9 What TIME of day is your pain at its worst? morning,daytime, evening Sleep (in general) Fair  Pain is worse with: walking, bending, standing and some activites Pain improves with: rest, medication and injections Relief from Meds: 5  Mobility walk with assistance use a walker how many minutes can you walk? 10 ability to climb steps?  no do you drive?  yes Do you have any goals in this area?  yes  Function retired I need  assistance with the following:  meal prep, household duties and shopping Do you have any goals in this area?  yes  Neuro/Psych bladder control problems weakness trouble walking dizziness anxiety  Prior Studies Any changes since last visit?  no  Physicians involved in your care Any changes since last visit?  no   Family History  Problem Relation Age of Onset  . Arthritis Mother   . Kidney disease Mother   . Heart disease Father   . Stroke Father   . Hypertension Father   . Diabetes Father   . Heart attack Father   . Heart attack Paternal Grandmother   . Diabetes Sister        one sister  . Hypertension Sister   . Hypertension Brother   . Multiple sclerosis Son    Social History   Socioeconomic History  . Marital status: Widowed    Spouse name: Not on file  . Number of children: 2  . Years of education: Not on file  . Highest education level: Not on file  Occupational History  . 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 Financial resource strain: Not on file  . Food insecurity:    Worry: Not on file    Inability: Not on file  . Transportation needs:    Medical: Not on file    Non-medical: Not on file  Tobacco Use  . Smoking status: Never Smoker  . Smokeless tobacco: Never Used  . Tobacco comment: never  used tobacco  Substance and Sexual Activity  . Alcohol use: No    Alcohol/week: 0.0 standard drinks  . Drug use: No  . Sexual activity: Not Currently  Lifestyle  . Physical activity:    Days per week: Not on file    Minutes per session: Not on file  . Stress: Not on file  Relationships  . Social connections:    Talks on phone: Not on file    Gets together: Not on file    Attends religious service: Not on file    Active member of club or organization: Not on file    Attends meetings of clubs or organizations: Not on file    Relationship status: Not on file  Other Topics Concern  . Not on file  Social History Narrative     Widowed, 2 sons.  Relocated to [place] 09/2012 to be closer to her son who has MS.   Husband d 2015--mesothelioma.   Occupation: former Pharmacist, hospital.   Education: masters degree level.   No T/A/Ds.   Lives in [place], independent living.   Past Surgical History:  Procedure Laterality Date  . APPENDECTOMY  1960  . AXILLARY SURGERY Left 1978   Multiple "lump" in armpit per pt  . CARDIAC CATHETERIZATION  01/2013   nonobstructive CAD, EF 55-60%  . CARDIOVASCULAR STRESS TEST  02/22/15   Low risk myocard perf imaging; wall motion normal, normal EF  . carotid duplex doppler  10/21/2017   R vertebral flow suggestive of possible distal obstruction.  Pt declined further w/u as of 10/29/17 but need to revisit this problem periodically.  . COCCYX REMOVAL  1972  . DEXA  06/05/2017   T-score -3.1  . DILATION AND CURETTAGE OF UTERUS  ? 1970's  . ESOPHAGOGASTRODUODENOSCOPY N/A 07/19/2014   Gastritis found + in the setting of supratherapeutic INR, +plavix, + meloxicam.  . EYE SURGERY Left 2012-2013   "injections for ~ 1 yr; don't really know what for" (07/12/2013)  . HEEL SPUR SURGERY Left 2008  . KNEE SURGERY  2005  . LEFT HEART CATHETERIZATION WITH CORONARY ANGIOGRAM N/A 01/30/2013   Procedure: LEFT HEART CATHETERIZATION WITH CORONARY ANGIOGRAM;  Surgeon: Clent Demark, MD;  Location[facility] C[facility] LAB;  Service: Cardiovascular;  Laterality: N/A;  . PLANTAR FASCIA RELEASE Left 2008  . SPIROMETRY  04/25/14   In hosp for acute asthma/COPD flare: mixed obstructive and restrictive lung disease. The FEV1 is severely reduced at 45% predicted.  FEV1 signif decreased compared to prior spirometry 07/23/13.  . TENDON RELEASE  1996   Right forearm and hand  . TOTAL ABDOMINAL HYSTERECTOMY  1974  . TRANSTHORACIC ECHOCARDIOGRAM  01/2013; 04/2014;08/2015; 09/2017   2014--NORMAL.  2015--focal basal septal hypertrophy, EF 55-60%, grade I diast dysfxn, mild LAE.  08/2015 EF 55-60%, nl LV syst fxn, grade I DD, valves wnl. 10/21/17:  EF 65-70%, grd I DD, o/w normal.   Past Medical History:  Diagnosis Date  . Acute upper GI bleed 06/2014   while pt taking coumadin, plavix, and meloxicam---despite being told not to take coumadin.  . Anginal pain ([place])    Nonobstructive CAD 2014; however, her cardiologist put her on a statin for this and NOT for hyperlipidemia per pt report.  Atyp CP 08/2017 at card f/u, plan for myoc perf imaging.  . Anxiety    panic attacks  . Asthma    w/ asbestososis   . BPPV (benign paroxysmal positional vertigo) 12/16/2012  . Chronic diastolic CHF (congestive heart failure) (  [address])    dry wt as of 11/06/16 is 168 lbs.  . Chronic lower back pain   . COPD (chronic obstructive pulmonary disease) ([place])   . DDD (degenerative disc disease)    lumbar and cervical.   . Diverticular disease   . Fibromyalgia    Patient states dx was around her late 47s but she had sx's for years prior to this.  . H/O hiatal hernia   . Hay fever   . History of pneumonia    hospitalized 12/2011, 02/2013, and 07/2013 [facility]) for this  . HTN (hypertension)    Renal artery dopplers 04/2013 neg for stenosis.  . Idiopathic angio-edema-urticaria 72014   Angioedema component was very minimal  . Insomnia   . Iron deficiency anemia    Hematologist in [place], [place] did extensive w/u; no cause found; failed oral supplement;; gets fairly regular (q38m or so) IV iron infusions (Venofer -iron sucrose- 200mg  with procrit.  "for 14 yr I've been getting blood work q month & getting infusions prn" (07/12/2013).  Dr. Marin Olp locally, iron infusions done, EPO deficiency dx'd  . Migraine syndrome    "not as often anymore; used to be ~ q wk" (07/12/2013)  . Mixed incontinence urge and stress   . Nephrolithiasis    "passed all on my own or they are still in there" (07/12/2013)  . Neuroleptic induced Parkinsonism ([place]) 2018   Dr. Carles Collet, neuro, saw her 11/24/17 and recommended d/c of abilify as first step.  D/c'd abilify and pt got complete recovery.  .  OSA on CPAP    prior to move to [place]--had another sleep study 10/2015 w/pulm Dr. Camillo Flaming.  . Osteoarthritis    "severe; progressing fast" (07/12/2013); multiple joints-not surgical candidate for TKR (03/2015).  Triamcinolon knee injections by Dr. Tessa Lerner 12/2017.  [name] Kitchen Pernicious anemia 08/24/2014  . Pleural plaque with presence of asbestos 07/22/2013  . Pulmonary embolism ([place]) 07/2013   Dx at [facility] with very small peripheral upper lobe pe 07/2013: pt took coumadin for about 8-9 mo  . Pyelonephritis    "several times over the last 30 yr" (07/12/2013)  . RBBB (right bundle branch block)   . Recurrent major depression ([place])   . Recurrent UTI    hx of hospitalization for pyelonephritis; started abx prophylaxis 06/2015  . Syncope    Hypotensive; ED visit--Dr. Terrence Dupont did Cath--nonobstructive CAD, EF 55-60%.  In retrospect, suspect pt rx med misuse/polypharmacy  . Tension headache, chronic    BP 124/78 (BP Location: Left Arm, Patient Position: Sitting, Cuff Size: Normal)   Pulse 73   Resp 14   Ht 5\' 4"  (1.626 m)   Wt 146 lb (66.2 kg)   SpO2 96%   BMI 25.06 kg/m   Opioid Risk Score:   Fall Risk Score:  `1  Depression screen PHQ 2/9  Depression screen [facility] 2/9 09/02/2018 07/01/2018 05/27/2018 05/19/2018 03/11/2018 02/11/2018 01/14/2018  Decreased Interest 1 1 1  0 1 1 0  Down, Depressed, Hopeless 1 1 1  0 1 1 0  PHQ - 2 Score 2 2 2  0 2 2 0  Altered sleeping - - 0 - - - -  Tired, decreased energy - - 1 - - - -  Change in appetite - - 0 - - - -  Feeling bad or failure about yourself  - - 0 - - - -  Trouble concentrating - - 0 - - - -  Moving slowly or fidgety/restless - - 0 - - - -  Suicidal thoughts - - 0 - - - -  PHQ-9 Score - - 3 - - - -  Difficult doing work/chores - - Not difficult at all - - - -  Some recent data might be hidden    Review of Systems  Constitutional: Negative.   HENT: Negative.   Eyes: Negative.   Respiratory: Positive for shortness of breath.   Cardiovascular: Negative.     Gastrointestinal: Positive for constipation, nausea and vomiting.  Endocrine: Negative.   Genitourinary: Negative.   Musculoskeletal: Positive for arthralgias, back pain, gait problem and neck pain.  Skin: Negative.   Allergic/Immunologic: Negative.   Neurological: Positive for dizziness and weakness.  Psychiatric/Behavioral: The patient is nervous/anxious.   All other systems reviewed and are negative.      Objective:   Physical Exam  Constitutional: She is oriented to person, place, and time. She appears well-developed and well-nourished.  HENT:  Head: Normocephalic and atraumatic.  Neck: Normal range of motion. Neck supple.  Cardiovascular: Normal rate and regular rhythm.  Pulmonary/Chest: Effort normal and breath sounds normal.  Musculoskeletal:  Normal Muscle Bulk and Muscle Testing Reveals: Upper Extremities: Full ROM and Muscle Strength 5/5 Bilateral AC Joint Tenderness Lumbar Paraspinal Tenderness: L-4-L-5 Right Greater Trochanter Tenderness Lower Extremities: Left: Full ROM and Muscle Strength 5/5 Right: Decreased ROM and Muscle Strength 5/5 Right Lower Extremity Flexion Produces Pain into Patella Arises from chair with ease Narrow Based Gait   Neurological: She is alert and oriented to person, place, and time.  Skin: Skin is warm and dry.  Psychiatric: She has a normal mood and affect. Her behavior is normal.  Nursing note and vitals reviewed.         Assessment & Plan:  1. Functional deficits secondary to Gait disorder:Continue with HEP.10/26/2018 2. Chronic Back pain/ Lumbar Radiculitis/fibromyalgia /R>L Knee OA Pain Management: 10/26/2018 Refilled: Oxycodone 10/325mg one tablet every 6 hours #100 and Voltaren Gel We will continue the opioid monitoring program, this consists of regular clinic visits, examinations, urine drug screen, pill counts as well as use of [place] Controlled Substance reporting System. 3. Depression with anxiety/Grief  reaction/Mood: 10/26/2018 Continue Xanax,Trazodone and Cymbalta .  4. [name] with asthma:. Albuterol prn. :Pulmonology Following. 10/26/2018 5. Bilateral Osteoarthritis of Bilateral Knees: Schedule for Bilateral Zilretta Injection. 10/26/2018 6. Fibromyalgia: Continue Home exercise regime. 10/26/2018 7. Cervicalgia:Continue HEP as tolerated. Alternate with heat and ice therapy. Continue to monitor. 10/26/18 8. Right Greater Trochanteric Bursitis:  Alternate Ice and Heat Therapy. Continue to Monitor.10/26/2018. 9. Chronic Bilateral Shoulder Pain: Continue HEP as Tolerated. Continue current medication regimen. Continue to Monitor.   20 minutes of face to face patient care time was spent during this visit. All questions were encouraged and answered.  F/U in 1 month

## 2018-10-28 ENCOUNTER — Ambulatory Visit: Payer: Medicare Other | Admitting: Registered Nurse

## 2018-11-25 ENCOUNTER — Encounter: Payer: Self-pay | Admitting: Physical Medicine & Rehabilitation

## 2018-11-25 ENCOUNTER — Encounter: Payer: Medicare Other | Attending: Physical Medicine & Rehabilitation | Admitting: Physical Medicine & Rehabilitation

## 2018-11-25 VITALS — BP 161/64 | HR 72 | Ht 64.0 in | Wt 152.0 lb

## 2018-11-25 DIAGNOSIS — M1711 Unilateral primary osteoarthritis, right knee: Secondary | ICD-10-CM

## 2018-11-25 DIAGNOSIS — Z79899 Other long term (current) drug therapy: Secondary | ICD-10-CM | POA: Diagnosis not present

## 2018-11-25 DIAGNOSIS — Z5181 Encounter for therapeutic drug level monitoring: Secondary | ICD-10-CM | POA: Diagnosis not present

## 2018-11-25 DIAGNOSIS — M797 Fibromyalgia: Secondary | ICD-10-CM | POA: Diagnosis not present

## 2018-11-25 DIAGNOSIS — R2689 Other abnormalities of gait and mobility: Secondary | ICD-10-CM | POA: Diagnosis not present

## 2018-11-25 DIAGNOSIS — M1712 Unilateral primary osteoarthritis, left knee: Secondary | ICD-10-CM

## 2018-11-25 MED ORDER — OXYCODONE-ACETAMINOPHEN 10-325 MG PO TABS: 1.0000 | ORAL_TABLET | Freq: Four times a day (QID) | ORAL | 0 refills | Status: DC | PRN

## 2018-11-25 NOTE — Progress Notes (Signed)
PROCEDURE NOTE  DIAGNOSIS:  Endstage OA of bilateral knees  INTERVENTION:   ZILRETTA INJECTION--both knees     After informed consent and preparation of the skin with betadine and isopropyl alcohol, I injected 32MG  of zilretta dissolved into 5cc of diluent into the bilateral knees via anterolateral approach. Contents of syring were shaken vigorously and aspiration was performed prior to injection. The patient tolerated well, and no complications were encountered. Afterward the area was cleaned and dressed. Post- injection instructions were provided including ice  if swelling or pain should occur.   Additionally, I refilled percocet today.   Will consider change to 2 month visits at next appt   Meredith Staggers, MD, [facility] 11/25/2018

## 2018-11-25 NOTE — Patient Instructions (Signed)
PLEASE FEEL FREE TO CALL OUR OFFICE WITH ANY PROBLEMS OR QUESTIONS ([phone])      

## 2018-12-25 ENCOUNTER — Encounter: Payer: Self-pay | Admitting: Registered Nurse

## 2018-12-25 ENCOUNTER — Encounter: Payer: Medicare Other | Attending: Physical Medicine & Rehabilitation | Admitting: Registered Nurse

## 2018-12-25 ENCOUNTER — Other Ambulatory Visit: Payer: Self-pay

## 2018-12-25 VITALS — BP 137/80 | HR 74 | Ht 64.0 in | Wt 153.0 lb

## 2018-12-25 DIAGNOSIS — Z79899 Other long term (current) drug therapy: Secondary | ICD-10-CM | POA: Diagnosis not present

## 2018-12-25 DIAGNOSIS — G894 Chronic pain syndrome: Secondary | ICD-10-CM

## 2018-12-25 DIAGNOSIS — M1712 Unilateral primary osteoarthritis, left knee: Secondary | ICD-10-CM | POA: Diagnosis not present

## 2018-12-25 DIAGNOSIS — M1711 Unilateral primary osteoarthritis, right knee: Secondary | ICD-10-CM | POA: Diagnosis not present

## 2018-12-25 DIAGNOSIS — R2689 Other abnormalities of gait and mobility: Secondary | ICD-10-CM | POA: Insufficient documentation

## 2018-12-25 DIAGNOSIS — F329 Major depressive disorder, single episode, unspecified: Secondary | ICD-10-CM | POA: Diagnosis not present

## 2018-12-25 DIAGNOSIS — M797 Fibromyalgia: Secondary | ICD-10-CM | POA: Diagnosis not present

## 2018-12-25 DIAGNOSIS — F411 Generalized anxiety disorder: Secondary | ICD-10-CM | POA: Diagnosis not present

## 2018-12-25 DIAGNOSIS — Z5181 Encounter for therapeutic drug level monitoring: Secondary | ICD-10-CM

## 2018-12-25 DIAGNOSIS — M5416 Radiculopathy, lumbar region: Secondary | ICD-10-CM | POA: Diagnosis not present

## 2018-12-25 DIAGNOSIS — Z79891 Long term (current) use of opiate analgesic: Secondary | ICD-10-CM | POA: Diagnosis not present

## 2018-12-25 MED ORDER — OXYCODONE-ACETAMINOPHEN 10-325 MG PO TABS: 1.0000 | ORAL_TABLET | Freq: Four times a day (QID) | ORAL | 0 refills | Status: DC | PRN

## 2018-12-25 NOTE — Progress Notes (Signed)
Subjective:    Patient ID: Linda [name], female    DOB: 18-Jun-1946, 73 y.o.   MRN: [id number]  HPI: Linda [name] is a 73 y.o. female who returns for follow up appointment for chronic pain and medication refill. She states her pain is located in her neck, lower back radiating into her bilateral lower extremities and right knee pain. She rates her pain 6. Her current exercise regime is walking.  [name] Morphine equivalent is 60.00  MMEShe is also prescribed Alprazolam  by [name].We have discussed the black box warning of using opioids and benzodiazepines. I highlighted the dangers of using these drugs together and discussed the adverse events including respiratory suppression, overdose, cognitive impairment and importance of compliance with current regimen. We will continue to monitor and adjust as indicated.   Last Oral Swab was Performed on 04/14/2018, it was consistent.    Pain Inventory Average Pain 6 Pain Right Now 6 My pain is constant, sharp, stabbing and aching  In the last 24 hours, has pain interfered with the following? General activity 7 Relation with others 7 Enjoyment of life 7 What TIME of day is your pain at its worst? morning daytime evening Sleep (in general) Fair  Pain is worse with: walking, bending, standing and some activites Pain improves with: rest, medication and injections Relief from Meds: 5  Mobility walk without assistance use a walker how many minutes can you walk? 10 ability to climb steps?  no do you drive?  yes Do you have any goals in this area?  yes  Function retired I need assistance with the following:  meal prep, household duties and shopping  Neuro/Psych bladder control problems trouble walking dizziness anxiety  Prior Studies Any changes since last visit?  no  Physicians involved in your care Any changes since last visit?  no   Family History  Problem Relation Age of Onset  . Arthritis Mother   .  Kidney disease Mother   . Heart disease Father   . Stroke Father   . Hypertension Father   . Diabetes Father   . Heart attack Father   . Heart attack Paternal Grandmother   . Diabetes Sister        one sister  . Hypertension Sister   . Hypertension Brother   . Multiple sclerosis Son    Social History   Socioeconomic History  . Marital status: Widowed    Spouse name: Not on file  . Number of children: 2  . Years of education: Not on file  . Highest education level: Not on file  Occupational History  . 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 Financial resource strain: Not on file  . Food insecurity:    Worry: Not on file    Inability: Not on file  . Transportation needs:    Medical: Not on file    Non-medical: Not on file  Tobacco Use  . Smoking status: Never Smoker  . Smokeless tobacco: Never Used  . Tobacco comment: never used tobacco  Substance and Sexual Activity  . Alcohol use: No    Alcohol/week: 0.0 standard drinks  . Drug use: No  . Sexual activity: Not Currently  Lifestyle  . Physical activity:    Days per week: Not on file    Minutes per session: Not on file  . Stress: Not on file  Relationships  . Social connections:    Talks on phone:  Not on file    Gets together: Not on file    Attends religious service: Not on file    Active member of club or organization: Not on file    Attends meetings of clubs or organizations: Not on file    Relationship status: Not on file  Other Topics Concern  . Not on file  Social History Narrative   Widowed, 2 sons.  Relocated to [place] 09/2012 to be closer to her son who has MS.   Husband d 2015--mesothelioma.   Occupation: former Pharmacist, hospital.   Education: masters degree level.   No T/A/Ds.   Lives in [place], independent living.   Past Surgical History:  Procedure Laterality Date  . APPENDECTOMY  1960  . AXILLARY SURGERY Left 1978   Multiple "lump" in armpit per pt  . CARDIAC  CATHETERIZATION  01/2013   nonobstructive CAD, EF 55-60%  . CARDIOVASCULAR STRESS TEST  02/22/15   Low risk myocard perf imaging; wall motion normal, normal EF  . carotid duplex doppler  10/21/2017   R vertebral flow suggestive of possible distal obstruction.  Pt declined further w/u as of 10/29/17 but need to revisit this problem periodically.  . COCCYX REMOVAL  1972  . DEXA  06/05/2017   T-score -3.1  . DILATION AND CURETTAGE OF UTERUS  ? 1970's  . ESOPHAGOGASTRODUODENOSCOPY N/A 07/19/2014   Gastritis found + in the setting of supratherapeutic INR, +plavix, + meloxicam.  . EYE SURGERY Left 2012-2013   "injections for ~ 1 yr; don't really know what for" (07/12/2013)  . HEEL SPUR SURGERY Left 2008  . KNEE SURGERY  2005  . LEFT HEART CATHETERIZATION WITH CORONARY ANGIOGRAM N/A 01/30/2013   Procedure: LEFT HEART CATHETERIZATION WITH CORONARY ANGIOGRAM;  Surgeon: Clent Demark, MD;  [facility] C[facility] LAB;  Service: Cardiovascular;  Laterality: N/A;  . PLANTAR FASCIA RELEASE Left 2008  . SPIROMETRY  04/25/14   In hosp for acute asthma/COPD flare: mixed obstructive and restrictive lung disease. The FEV1 is severely reduced at 45% predicted.  FEV1 signif decreased compared to prior spirometry 07/23/13.  . TENDON RELEASE  1996   Right forearm and hand  . TOTAL ABDOMINAL HYSTERECTOMY  1974  . TRANSTHORACIC ECHOCARDIOGRAM  01/2013; 04/2014;08/2015; 09/2017   2014--NORMAL.  2015--focal basal septal hypertrophy, EF 55-60%, grade I diast dysfxn, mild LAE.  08/2015 EF 55-60%, nl LV syst fxn, grade I DD, valves wnl. 10/21/17: EF 65-70%, grd I DD, o/w normal.   Past Medical History:  Diagnosis Date  . Acute upper GI bleed 06/2014   while pt taking coumadin, plavix, and meloxicam---despite being told not to take coumadin.  . Anginal pain ([place])    Nonobstructive CAD 2014; however, her cardiologist put her on a statin for this and NOT for hyperlipidemia per pt report.  Atyp CP 08/2017 at card f/u, plan for myoc perf  imaging.  . Anxiety    panic attacks  . Asthma    w/ asbestososis   . BPPV (benign paroxysmal positional vertigo) 12/16/2012  . Chronic diastolic CHF (congestive heart failure) (HCC)    dry wt as of 11/06/16 is 168 lbs.  . Chronic lower back pain   . COPD (chronic obstructive pulmonary disease) ([place])   . DDD (degenerative disc disease)    lumbar and cervical.   . Diverticular disease   . Fibromyalgia    Patient states dx was around her late 40s but she had sx's for years prior to this.  . H/O hiatal  hernia   . Hay fever   . History of pneumonia    hospitalized 12/2011, 02/2013, and 07/2013 [facility]) for this  . HTN (hypertension)    Renal artery dopplers 04/2013 neg for stenosis.  . Idiopathic angio-edema-urticaria 72014   Angioedema component was very minimal  . Insomnia   . Iron deficiency anemia    Hematologist in [place], [place] did extensive w/u; no cause found; failed oral supplement;; gets fairly regular (q76m or so) IV iron infusions (Venofer -iron sucrose- 200mg  with procrit.  "for 14 yr I've been getting blood work q month & getting infusions prn" (07/12/2013).  Dr. Marin Olp locally, iron infusions done, EPO deficiency dx'd  . Migraine syndrome    "not as often anymore; used to be ~ q wk" (07/12/2013)  . Mixed incontinence urge and stress   . Nephrolithiasis    "passed all on my own or they are still in there" (07/12/2013)  . Neuroleptic induced Parkinsonism ([place]) 2018   Dr. Carles Collet, neuro, saw her 11/24/17 and recommended d/c of abilify as first step.  D/c'd abilify and pt got complete recovery.  . OSA on CPAP    prior to move to [place]--had another sleep study 10/2015 w/pulm Dr. Camillo Flaming.  . Osteoarthritis    "severe; progressing fast" (07/12/2013); multiple joints-not surgical candidate for TKR (03/2015).  Triamcinolon knee injections by Dr. Tessa Lerner 12/2017.  [name] Kitchen Pernicious anemia 08/24/2014  . Pleural plaque with presence of asbestos 07/22/2013  . Pulmonary embolism ([place]) 07/2013   Dx at [facility] with  very small peripheral upper lobe pe 07/2013: pt took coumadin for about 8-9 mo  . Pyelonephritis    "several times over the last 30 yr" (07/12/2013)  . RBBB (right bundle branch block)   . Recurrent major depression ([place])   . Recurrent UTI    hx of hospitalization for pyelonephritis; started abx prophylaxis 06/2015  . Syncope    Hypotensive; ED visit--Dr. Terrence Dupont did Cath--nonobstructive CAD, EF 55-60%.  In retrospect, suspect pt rx med misuse/polypharmacy  . Tension headache, chronic    BP 137/80   Pulse 74   Ht 5\' 4"  (1.626 m)   Wt 153 lb (69.4 kg)   SpO2 93%   BMI 26.26 kg/m   Opioid Risk Score:   Fall Risk Score:  `1  Depression screen PHQ 2/9  Depression screen [facility] 2/9 12/25/2018 09/02/2018 07/01/2018 05/27/2018 05/19/2018 03/11/2018 02/11/2018  Decreased Interest 1 1 1 1  0 1 1  Down, Depressed, Hopeless 1 1 1 1  0 1 1  PHQ - 2 Score 2 2 2 2  0 2 2  Altered sleeping - - - 0 - - -  Tired, decreased energy - - - 1 - - -  Change in appetite - - - 0 - - -  Feeling bad or failure about yourself  - - - 0 - - -  Trouble concentrating - - - 0 - - -  Moving slowly or fidgety/restless - - - 0 - - -  Suicidal thoughts - - - 0 - - -  PHQ-9 Score - - - 3 - - -  Difficult doing work/chores - - - Not difficult at all - - -  Some recent data might be hidden     Review of Systems  Constitutional: Positive for unexpected weight change.  HENT: Negative.   Eyes: Negative.   Respiratory: Positive for shortness of breath.   Cardiovascular: Positive for leg swelling.  Gastrointestinal: Positive for constipation and nausea.  Endocrine: Negative.  Genitourinary: Negative.   Musculoskeletal: Negative.   Skin: Negative.   Allergic/Immunologic: Negative.   Neurological: Negative.   Hematological: Negative.   Psychiatric/Behavioral: Negative.   All other systems reviewed and are negative.      Objective:   Physical Exam Vitals signs and nursing note reviewed.  Constitutional:       Appearance: Normal appearance.  Neck:     Musculoskeletal: Normal range of motion and neck supple.  Cardiovascular:     Rate and Rhythm: Normal rate and regular rhythm.     Pulses: Normal pulses.     Heart sounds: Normal heart sounds.  Pulmonary:     Effort: Pulmonary effort is normal.     Breath sounds: Normal breath sounds.  Musculoskeletal:     Comments: Normal Muscle Bulk and Muscle Testing Reveals:  Upper Extremities: Full ROM and Muscle Strength 5/5 Bilateral AC Joint Tenderness  Lumbar Paraspinal Tenderness: L-3-L-5 Lower Extremities: Full ROM and Muscle Strength 5/5 Arises from Table with ease Narrow Based Gait   Skin:    General: Skin is warm and dry.  Neurological:     Mental Status: She is alert and oriented to person, place, and time.  Psychiatric:        Mood and Affect: Mood normal.        Behavior: Behavior normal.           Assessment & Plan:  1. Functional deficits secondary to Gait disorder:Continue with HEP.12/25/2018 2. Chronic Back pain/ Lumbar Radiculitis/fibromyalgia /R>L Knee OA Pain Management:12/25/2018 Refilled: Oxycodone 10/325mg one tablet every 6 hours #100. Second script sent for the following month. Continue Voltaren Gel We will continue the opioid monitoring program, this consists of regular clinic visits, examinations, urine drug screen, pill counts as well as use of [place] Controlled Substance reporting System. 3. Depression with anxiety/Grief reaction/Mood:12/25/2018 Continue Xanax,Trazodone and Cymbalta .  4. [name] with asthma:. Albuterol prn. :Pulmonology Following.12/25/2018 5. Bilateral Osteoarthritis of Bilateral Knees: S/P Bilateral Zilretta Injection with relief noted. 12/24/2018 6. Fibromyalgia: Continue Home exercise regime.12/25/2018 7. Cervicalgia:Continue HEP as tolerated. Alternate with heat and ice therapy. Continue to monitor.12/25/2018 8. Right Greater Trochanteric Bursitis:No complaints today.  Continue to Alternate Ice and Heat Therapy. Continue to Monitor.12/25/2018. 9. Chronic Bilateral Shoulder Pain: No complaints today. Continue HEP as Tolerated. Continue current medication regimen. Continue to Monitor. 12/25/2018  20 minutes of face to face patient care time was spent during this visit. All questions were encouraged and answered.  F/U in 1 month

## 2018-12-30 ENCOUNTER — Telehealth: Payer: Self-pay | Admitting: *Deleted

## 2018-12-30 LAB — DRUG TOX MONITOR 1 W/CONF, ORAL FLD
Alprazolam: 3.98 ng/mL — ABNORMAL HIGH (ref <0.50)
Amphetamines: NEGATIVE ng/mL (ref <10)
BENZODIAZEPINES: POSITIVE ng/mL — AB (ref <0.50)
BUPRENORPHINE: NEGATIVE ng/mL (ref <0.10)
Barbiturates: NEGATIVE ng/mL (ref <10)
CODEINE: NEGATIVE ng/mL (ref <2.5)
Chlordiazepoxide: NEGATIVE ng/mL (ref <0.50)
Clonazepam: NEGATIVE ng/mL (ref <0.50)
Cocaine: NEGATIVE ng/mL (ref <5.0)
Diazepam: NEGATIVE ng/mL (ref <0.50)
Dihydrocodeine: NEGATIVE ng/mL (ref <2.5)
FLUNITRAZEPAM: NEGATIVE ng/mL (ref <0.50)
Fentanyl: NEGATIVE ng/mL (ref <0.10)
Flurazepam: NEGATIVE ng/mL (ref <0.50)
Heroin Metabolite: NEGATIVE ng/mL (ref <1.0)
Hydrocodone: NEGATIVE ng/mL (ref <2.5)
Hydromorphone: NEGATIVE ng/mL (ref <2.5)
Lorazepam: NEGATIVE ng/mL (ref <0.50)
MARIJUANA: NEGATIVE ng/mL (ref <2.5)
MDMA: NEGATIVE ng/mL (ref <10)
MEPROBAMATE: NEGATIVE ng/mL (ref <2.5)
METHADONE: NEGATIVE ng/mL (ref <5.0)
Midazolam: NEGATIVE ng/mL (ref <0.50)
Morphine: NEGATIVE ng/mL (ref <2.5)
NOROXYCODONE: 136.4 ng/mL — AB (ref <2.5)
Nicotine Metabolite: NEGATIVE ng/mL (ref <5.0)
Nordiazepam: NEGATIVE ng/mL (ref <0.50)
Norhydrocodone: NEGATIVE ng/mL (ref <2.5)
Opiates: POSITIVE ng/mL — AB (ref <2.5)
Oxazepam: NEGATIVE ng/mL (ref <0.50)
Oxymorphone: NEGATIVE ng/mL (ref <2.5)
Phencyclidine: NEGATIVE ng/mL (ref <10)
Tapentadol: NEGATIVE ng/mL (ref <5.0)
Temazepam: NEGATIVE ng/mL (ref <0.50)
Tramadol: NEGATIVE ng/mL (ref <5.0)
Triazolam: NEGATIVE ng/mL (ref <0.50)
Zolpidem: NEGATIVE ng/mL (ref <5.0)

## 2018-12-30 LAB — DRUG TOX ALC METAB W/CON, ORAL FLD: Alcohol Metabolite: NEGATIVE ng/mL (ref <25)

## 2018-12-30 NOTE — Telephone Encounter (Signed)
Oral swab drug screen was consistent for prescribed medications.  ?

## 2019-01-12 ENCOUNTER — Other Ambulatory Visit: Payer: Self-pay | Admitting: Physician Assistant

## 2019-01-13 NOTE — Telephone Encounter (Signed)
This is Dr. Crenshaw's pt 

## 2019-01-21 ENCOUNTER — Other Ambulatory Visit: Payer: Self-pay | Admitting: Family Medicine

## 2019-02-01 ENCOUNTER — Telehealth: Payer: Self-pay | Admitting: Physical Medicine & Rehabilitation

## 2019-02-01 NOTE — Telephone Encounter (Signed)
Linda [name] had Zilretta injection in December next appointment will be in March, she verbalizes understanding.

## 2019-02-01 NOTE — Telephone Encounter (Signed)
Patient called wanting to change appointment with Zella Ball to Dr. Naaman Plummer to get a knee injection.  I am not sure what type of knee injection he would want to do.  It looks like she had a Zilretta knee injection in December.  Please advise for  Insurance auth purposes.

## 2019-02-03 ENCOUNTER — Ambulatory Visit: Payer: Medicare Other | Admitting: Physical Medicine & Rehabilitation

## 2019-02-05 ENCOUNTER — Other Ambulatory Visit: Payer: Self-pay | Admitting: Family Medicine

## 2019-02-05 NOTE — Telephone Encounter (Signed)
RF request for lamotrigine LOV:  09/22/18 Next ov: 03/24/19 Last written: 08/12/18 #16g w/ 5RF  Please advise. Thanks.

## 2019-02-16 ENCOUNTER — Telehealth: Payer: Self-pay | Admitting: Registered Nurse

## 2019-02-16 DIAGNOSIS — M797 Fibromyalgia: Secondary | ICD-10-CM

## 2019-02-16 DIAGNOSIS — M1712 Unilateral primary osteoarthritis, left knee: Secondary | ICD-10-CM

## 2019-02-16 MED ORDER — OXYCODONE-ACETAMINOPHEN 10-325 MG PO TABS: 1.0000 | ORAL_TABLET | Freq: Four times a day (QID) | ORAL | 0 refills | Status: DC | PRN

## 2019-02-16 NOTE — Telephone Encounter (Signed)
Linda [name] has an appointment with [name] on 02/24/2019. Last Oxycodone prescription filled on 01/21/2019. Oxycodone prescription e- scribe, to accommodate scheduled appointment.

## 2019-02-19 ENCOUNTER — Ambulatory Visit: Payer: Medicare Other | Admitting: Registered Nurse

## 2019-02-22 ENCOUNTER — Ambulatory Visit: Payer: Medicare Other | Admitting: Registered Nurse

## 2019-02-24 ENCOUNTER — Encounter: Payer: Self-pay | Admitting: Physical Medicine & Rehabilitation

## 2019-02-24 ENCOUNTER — Other Ambulatory Visit: Payer: Self-pay

## 2019-02-24 ENCOUNTER — Encounter: Payer: Medicare Other | Attending: Physical Medicine & Rehabilitation | Admitting: Physical Medicine & Rehabilitation

## 2019-02-24 VITALS — BP 133/79 | HR 72 | Ht 64.0 in | Wt 157.6 lb

## 2019-02-24 DIAGNOSIS — M1712 Unilateral primary osteoarthritis, left knee: Secondary | ICD-10-CM

## 2019-02-24 DIAGNOSIS — M1711 Unilateral primary osteoarthritis, right knee: Secondary | ICD-10-CM

## 2019-02-24 DIAGNOSIS — R2689 Other abnormalities of gait and mobility: Secondary | ICD-10-CM | POA: Insufficient documentation

## 2019-02-24 DIAGNOSIS — M797 Fibromyalgia: Secondary | ICD-10-CM | POA: Insufficient documentation

## 2019-02-24 DIAGNOSIS — Z5181 Encounter for therapeutic drug level monitoring: Secondary | ICD-10-CM | POA: Insufficient documentation

## 2019-02-24 DIAGNOSIS — Z79899 Other long term (current) drug therapy: Secondary | ICD-10-CM | POA: Insufficient documentation

## 2019-02-24 NOTE — Patient Instructions (Signed)
PLEASE FEEL FREE TO CALL OUR OFFICE WITH ANY PROBLEMS OR QUESTIONS ([phone])      

## 2019-02-24 NOTE — Progress Notes (Signed)
PROCEDURE NOTE  DIAGNOSIS:  OA right knee  INTERVENTION:   ZILRETTA INJECTION     After informed consent and preparation of the skin with betadine and isopropyl alcohol, I injected 32MG  of zilretta dissolved into 5cc of diluent into the right knee via anterolateral approach. Contents of syring were shaken vigorously and aspiration was performed prior to injection. The patient tolerated well, and no complications were encountered. Afterward the area was cleaned and dressed. Post- injection instructions were provided including ice  if swelling or pain should occur.    Meredith Staggers, MD, [facility] 02/24/2019

## 2019-03-01 ENCOUNTER — Encounter (HOSPITAL_BASED_OUTPATIENT_CLINIC_OR_DEPARTMENT_OTHER): Payer: Medicare Other | Admitting: Physical Medicine & Rehabilitation

## 2019-03-01 ENCOUNTER — Other Ambulatory Visit: Payer: Self-pay

## 2019-03-01 ENCOUNTER — Encounter: Payer: Self-pay | Admitting: Physical Medicine & Rehabilitation

## 2019-03-01 VITALS — BP 128/78 | HR 75 | Ht 64.0 in | Wt 153.4 lb

## 2019-03-01 DIAGNOSIS — M1712 Unilateral primary osteoarthritis, left knee: Secondary | ICD-10-CM

## 2019-03-01 DIAGNOSIS — M797 Fibromyalgia: Secondary | ICD-10-CM | POA: Diagnosis not present

## 2019-03-01 DIAGNOSIS — R2689 Other abnormalities of gait and mobility: Secondary | ICD-10-CM | POA: Diagnosis not present

## 2019-03-01 DIAGNOSIS — Z5181 Encounter for therapeutic drug level monitoring: Secondary | ICD-10-CM | POA: Diagnosis not present

## 2019-03-01 DIAGNOSIS — Z79899 Other long term (current) drug therapy: Secondary | ICD-10-CM | POA: Diagnosis not present

## 2019-03-01 DIAGNOSIS — M1711 Unilateral primary osteoarthritis, right knee: Secondary | ICD-10-CM | POA: Diagnosis not present

## 2019-03-01 NOTE — Progress Notes (Signed)
PROCEDURE NOTE  DIAGNOSIS:  OA left knee  INTERVENTION:   ZILRETTA INJECTION     After informed consent and preparation of the skin with betadine and isopropyl alcohol, I injected 32MG  of zilretta dissolved into 5cc of diluent into left knee via anterolateral approach. Contents of syring were shaken vigorously and aspiration was performed prior to injection. The patient tolerated well, and no complications were encountered. Afterward the area was cleaned and dressed. Post- injection instructions were provided including ice  if swelling or pain should occur.    Meredith Staggers, MD, [facility] 03/01/2019

## 2019-03-01 NOTE — Patient Instructions (Signed)
PLEASE FEEL FREE TO CALL OUR OFFICE WITH ANY PROBLEMS OR QUESTIONS ([phone])      

## 2019-03-03 ENCOUNTER — Ambulatory Visit: Payer: Medicare Other | Admitting: Physical Medicine & Rehabilitation

## 2019-03-08 ENCOUNTER — Telehealth: Payer: Self-pay

## 2019-03-08 DIAGNOSIS — M797 Fibromyalgia: Secondary | ICD-10-CM

## 2019-03-08 DIAGNOSIS — M1712 Unilateral primary osteoarthritis, left knee: Secondary | ICD-10-CM

## 2019-03-08 MED ORDER — OXYCODONE-ACETAMINOPHEN 10-325 MG PO TABS: 1.0000 | ORAL_TABLET | Freq: Four times a day (QID) | ORAL | 0 refills | Status: DC | PRN

## 2019-03-08 NOTE — Telephone Encounter (Signed)
PMP was reviewed: Oxycodone e-scribed. Placed a call to Ms. Caris, she is aware of the above. She verbalizes understanding.

## 2019-03-08 NOTE — Telephone Encounter (Signed)
Patient called states that [place] asked her to call and remind Zella Ball to send in her medication for next month this week.

## 2019-03-09 ENCOUNTER — Ambulatory Visit: Payer: Medicare Other | Admitting: Physical Medicine & Rehabilitation

## 2019-03-10 ENCOUNTER — Other Ambulatory Visit: Payer: Self-pay | Admitting: Family Medicine

## 2019-03-19 ENCOUNTER — Telehealth: Payer: Self-pay | Admitting: Family Medicine

## 2019-03-19 DIAGNOSIS — N10 Acute tubulo-interstitial nephritis: Secondary | ICD-10-CM | POA: Diagnosis not present

## 2019-03-19 NOTE — Telephone Encounter (Signed)
Spoke with patients son Jessy Oto per DPR, who stated he was on his way to visit his mother and he would speak with her about her symptoms and give [place] a call back. Son was advised that since we are not seeing patients at the office and it is Friday afternoon with Dr Anitra Lauth not here that a urgent care may be the best option for patient. Son was also advised we could get urine sample and then have a telephone visit with patient. He stated he would call back.

## 2019-03-19 NOTE — Telephone Encounter (Signed)
Pt showed up to the office to give urine sample at 1609. Pt was told we have to have a order to run the urine and that her son was supposed to call me back. Pt was upset because they drove from [place]. Pt was told they could visit urgent care next door. Urine collection cup was given and she was told she could drop off between 8:30-11am and then video/telephone visit could take place. Pt was educated on how to collect urine sample.

## 2019-03-19 NOTE — Telephone Encounter (Signed)
Called and spoke with patient and she states these S/S have been going on for x1 month. She lives at [facility] and says if they leave the property they are on lock down for 14 days in their apt. Pt states she is unable to get into my chart to do my chart visit. When pt was asked if there was a house MD or nurse at [facility] she stated nobody was allowed in the building. Son was called and message was left to return call to speak with him about moms s/s and if MD is at [facility]

## 2019-03-19 NOTE — Telephone Encounter (Signed)
When I called patient to confirm next week's follow up visit she states she is having urinary frequency, back pain, dark colored urine, and urine is foul smelling. Patient is requesting an antibiotic to be called in to her pharmacy.

## 2019-03-22 NOTE — Telephone Encounter (Signed)
Received records from [facility], pt was diagnosed with pyelonephritis. Does not appear to have had culture or antibiotics prescribed.   Spoke with patient regarding symptoms. Patient reports she continues to have symptoms, would not elaborate. Offered to schedule a visit with [name], patient declined. States she is taking an old antibiotic ("some type of sulfa") and she will "hope for the best". Encouraged patient to have f/u with PCP or lab visit patient declines.

## 2019-03-24 ENCOUNTER — Telehealth: Payer: Self-pay | Admitting: Family Medicine

## 2019-03-24 ENCOUNTER — Other Ambulatory Visit: Payer: Self-pay | Admitting: Family Medicine

## 2019-03-24 ENCOUNTER — Ambulatory Visit: Payer: Medicare Other | Admitting: Family Medicine

## 2019-03-24 ENCOUNTER — Other Ambulatory Visit: Payer: Self-pay

## 2019-03-24 MED ORDER — CIPROFLOXACIN HCL 500 MG PO TABS: 500.0000 mg | ORAL_TABLET | Freq: Two times a day (BID) | ORAL | 0 refills | Status: AC

## 2019-03-24 NOTE — Telephone Encounter (Signed)
Noted. I talked with pt today and addressed all of this.  Signed:  Crissie Sickles, MD           03/24/2019

## 2019-03-24 NOTE — Telephone Encounter (Signed)
Pt was scheduled for 1 PM virtual visit today but she had cancelled so I called her. Apologized for recent logistical issues surrounding her urinary problem. She has been taking one Bactrim DS qd (From an old bottle that was rx'd for UTI prophylaxis) for the last few days.  Her sx's have improved just a litle bit (foul odor, dysuria, urgency, frequency).  No fever. No pain in back or CVA/flank regions. She under strict quarantine b/c of a person testing positive for COVID -19 at her ALF [facility]). She denies any other current problems that she would like to discuss. I recommended she d/c the bactrim today and I sent in rx for cipro 500 mg bid x 7d for her to start right away. Phone f/u in 1 wk.  Signed:  Crissie Sickles, MD           03/24/2019

## 2019-03-24 NOTE — Progress Notes (Signed)
A user error has taken place: encounter opened in error, closed for administrative reasons.

## 2019-03-29 ENCOUNTER — Encounter: Payer: Self-pay | Admitting: Family Medicine

## 2019-03-29 ENCOUNTER — Ambulatory Visit (INDEPENDENT_AMBULATORY_CARE_PROVIDER_SITE_OTHER): Payer: Medicare Other | Admitting: Family Medicine

## 2019-03-29 ENCOUNTER — Other Ambulatory Visit: Payer: Self-pay

## 2019-03-29 DIAGNOSIS — N10 Acute tubulo-interstitial nephritis: Secondary | ICD-10-CM | POA: Diagnosis not present

## 2019-03-29 NOTE — Progress Notes (Signed)
OFFICE VISIT  03/29/2019   CC:  Chief Complaint  Patient presents with  . Follow-up    urine infection     HPI:    Patient is a 73 y.o. Caucasian female With whom I am doing a telephone visit today (due to COVID-19 pandemic restrictions). Prior to proceeding with our visit today, the telephone visit process was explained  and pt gave consent to proceed and understood that this process would be used to assess and treat the patient.  Telemedicine visit is a necessity given the COVID-19 restrictions in place at the current time.  I talked to her on telephone 03/24/19 regarding recent UTI.  Unfortunately, due to some logistical problems surrounding COVID-19 quarantine at her ALF and our office hours, we have not obtained a urine sample from her. She was improving minimally taking some old bactrim DS once a day for a few days when I spoke with her, so I switched her to cipro 500 mg bid x 7d.  I asked her to do a telephone f/u 1 week later. If not improving significantly, our plan was to get a urine sample if her COVID-19 quarantine restrictions could allow for her to come here and give one.  Interim Hx: She no longer having pain in CVA, no dysuria, color changing back to yellow and not brown, odor is less, frequency and urgency are much better.  No fever, no suprapubic pain, no nausea.    Past Medical History:  Diagnosis Date  . Acute upper GI bleed 06/2014   while pt taking coumadin, plavix, and meloxicam---despite being told not to take coumadin.  . Anginal pain ([place])    Nonobstructive CAD 2014; however, her cardiologist put her on a statin for this and NOT for hyperlipidemia per pt report.  Atyp CP 08/2017 at card f/u, plan for myoc perf imaging.  . Anxiety    panic attacks  . Asthma    w/ asbestososis   . BPPV (benign paroxysmal positional vertigo) 12/16/2012  . Chronic diastolic CHF (congestive heart failure) (HCC)    dry wt as of 11/06/16 is 168 lbs.  . Chronic lower back pain    . COPD (chronic obstructive pulmonary disease) ([place])   . DDD (degenerative disc disease)    lumbar and cervical.   . Diverticular disease   . Fibromyalgia    Patient states dx was around her late 34s but she had sx's for years prior to this.  . H/O hiatal hernia   . Hay fever   . History of pneumonia    hospitalized 12/2011, 02/2013, and 07/2013 [facility]) for this  . HTN (hypertension)    Renal artery dopplers 04/2013 neg for stenosis.  . Idiopathic angio-edema-urticaria 72014   Angioedema component was very minimal  . Insomnia   . Iron deficiency anemia    Hematologist in [place], [place] did extensive w/u; no cause found; failed oral supplement;; gets fairly regular (q74m or so) IV iron infusions (Venofer -iron sucrose- 200mg  with procrit.  "for 14 yr I've been getting blood work q month & getting infusions prn" (07/12/2013).  Dr. Marin Olp locally, iron infusions done, EPO deficiency dx'd  . Migraine syndrome    "not as often anymore; used to be ~ q wk" (07/12/2013)  . Mixed incontinence urge and stress   . Nephrolithiasis    "passed all on my own or they are still in there" (07/12/2013)  . Neuroleptic induced Parkinsonism ([place]) 2018   Dr. Carles Collet, neuro, saw her 11/24/17 and recommended  d/c of abilify as first step.  D/c'd abilify and pt got complete recovery.  . OSA on CPAP    prior to move to [place]--had another sleep study 10/2015 w/pulm Dr. Camillo Flaming.  . Osteoarthritis    "severe; progressing fast" (07/12/2013); multiple joints-not surgical candidate for TKR (03/2015).  Triamcinolon knee injections by Dr. Tessa Lerner 12/2017.  [name] Kitchen Pernicious anemia 08/24/2014  . Pleural plaque with presence of asbestos 07/22/2013  . Pulmonary embolism ([place]) 07/2013   Dx at [facility] with very small peripheral upper lobe pe 07/2013: pt took coumadin for about 8-9 mo  . Pyelonephritis    "several times over the last 30 yr" (07/12/2013)  . RBBB (right bundle branch block)   . Recurrent major depression ([place])   . Recurrent UTI    hx of  hospitalization for pyelonephritis; started abx prophylaxis 06/2015  . Syncope    Hypotensive; ED visit--Dr. Terrence Dupont did Cath--nonobstructive CAD, EF 55-60%.  In retrospect, suspect pt rx med misuse/polypharmacy  . Tension headache, chronic     Past Surgical History:  Procedure Laterality Date  . APPENDECTOMY  1960  . AXILLARY SURGERY Left 1978   Multiple "lump" in armpit per pt  . CARDIAC CATHETERIZATION  01/2013   nonobstructive CAD, EF 55-60%  . CARDIOVASCULAR STRESS TEST  02/22/15   Low risk myocard perf imaging; wall motion normal, normal EF  . carotid duplex doppler  10/21/2017   R vertebral flow suggestive of possible distal obstruction.  Pt declined further w/u as of 10/29/17 but need to revisit this problem periodically.  . COCCYX REMOVAL  1972  . DEXA  06/05/2017   T-score -3.1  . DILATION AND CURETTAGE OF UTERUS  ? 1970's  . ESOPHAGOGASTRODUODENOSCOPY N/A 07/19/2014   Gastritis found + in the setting of supratherapeutic INR, +plavix, + meloxicam.  . EYE SURGERY Left 2012-2013   "injections for ~ 1 yr; don't really know what for" (07/12/2013)  . HEEL SPUR SURGERY Left 2008  . KNEE SURGERY  2005  . LEFT HEART CATHETERIZATION WITH CORONARY ANGIOGRAM N/A 01/30/2013   Procedure: LEFT HEART CATHETERIZATION WITH CORONARY ANGIOGRAM;  Surgeon: Clent Demark, MD;  Location: [facility] C[facility] LAB;  Service: Cardiovascular;  Laterality: N/A;  . PLANTAR FASCIA RELEASE Left 2008  . SPIROMETRY  04/25/14   In hosp for acute asthma/COPD flare: mixed obstructive and restrictive lung disease. The FEV1 is severely reduced at 45% predicted.  FEV1 signif decreased compared to prior spirometry 07/23/13.  . TENDON RELEASE  1996   Right forearm and hand  . TOTAL ABDOMINAL HYSTERECTOMY  1974  . TRANSTHORACIC ECHOCARDIOGRAM  01/2013; 04/2014;08/2015; 09/2017   2014--NORMAL.  2015--focal basal septal hypertrophy, EF 55-60%, grade I diast dysfxn, mild LAE.  08/2015 EF 55-60%, nl LV syst fxn, grade I DD, valves wnl.  10/21/17: EF 65-70%, grd I DD, o/w normal.    Outpatient Medications Prior to Visit  Medication Sig Dispense Refill  . alendronate (FOSAMAX) 70 MG tablet Take 1 tablet (70 mg total) by mouth every 7 (seven) days. Take with a full glass of water on an empty stomach, first thing in the morning and remain up right for 30 minutes after taking. 12 tablet 3  . ALPRAZolam (XANAX) 1 MG tablet TAKE 1 TABLET THREE TIMES DAILY AS NEEDED FOR ANXIETY. 90 tablet 5  . aspirin 81 MG tablet Take 81 mg by mouth at bedtime.    . ciprofloxacin (CIPRO) 500 MG tablet Take 1 tablet (500 mg total) by mouth 2 (two) times daily  for 7 days. 14 tablet 0  . diclofenac sodium (VOLTAREN) 1 % GEL Apply 2 g topically 3 (three) times daily as needed (pain). 2 Tube 2  . DULoxetine (CYMBALTA) 30 MG capsule TAKE 1 CAPSULE ONCE DAILY ALONG WITH 60MG  CAPSULE FOR TOTAL 90MG  DAILY. 90 capsule 1  . DULoxetine (CYMBALTA) 60 MG capsule TAKE 1 CAPSULE A DAY TO BE COMBINED WITH 30MG  CAPSULE 90 capsule 1  . furosemide (LASIX) 40 MG tablet Take 1 tablet (40 mg total) by mouth daily. (Patient taking differently: Take 40 mg by mouth as needed for fluid. ) 60 tablet 11  . isosorbide mononitrate (IMDUR) 30 MG 24 hr tablet TAKE 2 TABLETS IN THE AM AND 1 TABLET IN THE PM. 90 tablet 2  . lamoTRIgine (LAMICTAL) 100 MG tablet TAKE 1 TABLET EACH DAY. 90 tablet 1  . metoprolol succinate (TOPROL-XL) 50 MG 24 hr tablet 1 tab po qAM, then take another 1/2-1 tab if bp is >160/100. 90 tablet 3  . metroNIDAZOLE (METROGEL) 0.75 % gel Apply 1 application topically 2 (two) times daily. 45 g 3  . Multiple Vitamin (MULTIVITAMIN WITH MINERALS) TABS tablet Take 1 tablet by mouth daily.    . nitroGLYCERIN (NITROSTAT) 0.4 MG SL tablet Place 1 tablet (0.4 mg total) under the tongue every 5 (five) minutes as needed for chest pain (x 3 doses). 25 tablet 3  . oxyCODONE-acetaminophen (PERCOCET) 10-325 MG tablet Take 1 tablet by mouth every 6 (six) hours as needed for pain.  100 tablet 0  . pantoprazole (PROTONIX) 40 MG tablet TAKE 1 TABLET BY MOUTH DAILY. 90 tablet 1  . traZODone (DESYREL) 50 MG tablet TAKE 2-4 TABLETS AT BEDTIME 120 tablet 5  . Vitamin D, Ergocalciferol, (DRISDOL) 50000 units CAPS capsule 1 tab po TWICE per week 24 capsule 3  . albuterol (PROVENTIL HFA;VENTOLIN HFA) 108 (90 Base) MCG/ACT inhaler Inhale 2 puffs into the lungs every 6 (six) hours as needed for wheezing or shortness of breath. (Patient not taking: Reported on 03/29/2019) 1 Inhaler 1  . fluconazole (DIFLUCAN) 100 MG tablet     . fluticasone (FLONASE) 50 MCG/ACT nasal spray USE 2 SPRAYS IN EACH NOSTRIL ONCE DAILY (Patient not taking: Reported on 03/29/2019) 16 g 3  . OXYGEN Inhale 3 L into the lungs continuous.     Facility-Administered Medications Prior to Visit  Medication Dose Route Frequency Provider Last Rate Last Dose  . ferumoxytol (FERAHEME) 510 mg in sodium chloride 0.9 % 100 mL IVPB  510 mg Intravenous Once Volanda Napoleon, MD        Allergies  Allergen Reactions  . Abilify [Aripiprazole] Other (See Comments)    parkinsonism  . Penicillins Itching, Swelling and Rash    Tolerated Cefepime in ED. Has patient had a PCN reaction causing immediate rash, facial/tongue/throat swelling, SOB or lightheadedness with hypotension: Yes Has patient had a PCN reaction causing severe rash involving mucus membranes or skin necrosis: No Has patient had a PCN reaction that required hospitalization: No  Has patient had a PCN reaction occurring within the last 10 years: No     ROS As per HPI  PE: There were no vitals taken for this visit.  Gen: Alert, NAD by voice/interaction on phone.  Patient is oriented to person, place, time, and situation. Lucid thought and speech.   LABS:    Chemistry      Component Value Date/Time   NA 140 09/22/2018 1037   NA 141 06/05/2015 1315   K 3.9  09/22/2018 1037   K 3.9 06/05/2015 1315   CL 103 09/22/2018 1037   CL 103 06/05/2015 1315   CO2 30  09/22/2018 1037   CO2 28 06/05/2015 1315   BUN 14 09/22/2018 1037   BUN 14 06/05/2015 1315   CREATININE 0.85 09/22/2018 1037   CREATININE 0.87 02/03/2017 1523      Component Value Date/Time   CALCIUM 8.9 09/22/2018 1037   CALCIUM 9.0 06/05/2015 1315   ALKPHOS 58 01/01/2018 1052   ALKPHOS 84 06/05/2015 1315   AST 29 01/01/2018 1052   AST 23 06/05/2015 1315   ALT 16 01/01/2018 1052   ALT 17 06/05/2015 1315   BILITOT 1.2 01/01/2018 1052   BILITOT 1.00 06/05/2015 1315     Lab Results  Component Value Date   WBC 7.6 08/07/2018   HGB 13.6 08/07/2018   HCT 41.2 08/07/2018   MCV 90.9 08/07/2018   PLT 215.0 08/07/2018   Lab Results  Component Value Date   TSH 1.15 09/22/2018   Lab Results  Component Value Date   HGBA1C 5.6 01/02/2016     IMPRESSION AND PLAN:  UTI, ? Pyelo-->MUCH improved on 4 and 1/2 days of cipro 500 mg bid. She'll finish out her abx and if any ongoing sx's she'll call.  She is frustrated with the strict quarantine for all residents at [facility], but is making it through with the help of God-->devotionals, bible, christian movies.  Spent 10 min with pt today, with >50% of this time spent in counseling and care coordination regarding the above problems.  An After Visit Summary was printed and given to the patient.  FOLLOW UP: Return if symptoms worsen or fail to improve.  Signed:  Crissie Sickles, MD           03/29/2019

## 2019-04-02 ENCOUNTER — Ambulatory Visit: Payer: Self-pay

## 2019-04-02 ENCOUNTER — Other Ambulatory Visit: Payer: Self-pay | Admitting: Family Medicine

## 2019-04-02 NOTE — Telephone Encounter (Signed)
Pt c/o red quarter sized rash to both upper legs to ankles. Pt counted 17 spots on each leg. Rash started yesterday. No itching and no pain. Skin appears red under rash. Pt given care advice for the rash and pt verbalized understanding.  Pt also c/o moderate dizziness with standing and bending over and worse when she lays down. Symptom began yesterday morning.  Pt stated that she wanted to page Dr Anitra Lauth to talk to her directly. Informed pt that office is closed and she could have a Saturday appt. If available. Saturday clinic is full. Offered to make appt for next week or go to [facility]. Pt refused to go to [facility] stating she is in quarantine. Advised pt to seek emergency care or go to [facility] if worsens over the weekend.             Reason for Disposition . [1] Looks infected (spreading redness, pus) AND [2] no fever  Answer Assessment - Initial Assessment Questions 1. APPEARANCE of RASH: "Describe the rash."      Red rash 2. LOCATION: "Where is the rash located?"      Upper legs to ankle 3. NUMBER: "How many spots are there?"      17 on both legs 4. SIZE: "How big are the spots?" (Inches, centimeters or compare to size of a coin)      Quarter sized  5. ONSET: "When did the rash start?"      yesterday 6. ITCHING: "Does the rash itch?" If so, ask: "How bad is the itch?"  (Scale 1-10; or mild, moderate, severe)     no 7. PAIN: "Does the rash hurt?" If so, ask: "How bad is the pain?"  (Scale 1-10; or mild, moderate, severe)     no 8. OTHER SYMPTOMS: "Do you have any other symptoms?" (e.g., fever)     no 9. PREGNANCY: "Is there any chance you are pregnant?" "When was your last menstrual period?"     n/a  Answer Assessment - Initial Assessment Questions 1. DESCRIPTION: "Describe your dizziness."     At times doesn't see very well things look hazy, if your bend over you lose balance and almost fall 2. LIGHTHEADED: "Do you feel lightheaded?" (e.g., somewhat faint, woozy, weak upon  standing)     yes 3. VERTIGO: "Do you feel like either you or the room is spinning or tilting?" (i.e. vertigo)     At times 4. SEVERITY: "How bad is it?"  "Do you feel like you are going to faint?" "Can you stand and walk?"   - MILD - walking normally   - MODERATE - interferes with normal activities (e.g., work, school)    - SEVERE - unable to stand, requires support to walk, feels like passing out now.      moderate 5. ONSET:  "When did the dizziness begin?"     Yesterday morning 6. AGGRAVATING FACTORS: "Does anything make it worse?" (e.g., standing, change in head position)     Bending over- sudden movement when standing- worse when laying down 7. HEART RATE: "Can you tell me your heart rate?" "How many beats in 15 seconds?"  (Note: not all patients can do this)       Pt cannot do this 8. CAUSE: "What do you think is causing the dizziness?"     Pt does not know  Protocols used: RASH OR REDNESS - LOCALIZED-A-AH, DIZZINESS - LIGHTHEADEDNESS-A-AH

## 2019-04-02 NOTE — Telephone Encounter (Signed)
Pt called x 2  Unable to leave message "mailbox is full"

## 2019-04-05 NOTE — Telephone Encounter (Signed)
RF request for alprazolam Last RX 10/08/18 # 90 x 5 rfs. Last OV 03/29/2019 No upcoming ov.  Please advise.

## 2019-04-05 NOTE — Telephone Encounter (Signed)
SW pt this morning regarding rash and she stated nothing has changed since Friday.   Please advise, thanks.

## 2019-04-05 NOTE — Telephone Encounter (Signed)
OK 

## 2019-04-05 NOTE — Telephone Encounter (Signed)
Thanks for calling her. Pls see if her urinary infection symptoms are all gone. I suspect she has a drug rash from the recent antibiotic (cipro). As long as the rash is not worsening + her bladder infection symptoms have resolved, then there's nothing different to do at this time.  Let me know-thx

## 2019-04-05 NOTE — Telephone Encounter (Signed)
Pt is not having any urinary symptoms and stated she needed you Friday but is not expecting you to do anything.

## 2019-04-19 ENCOUNTER — Other Ambulatory Visit: Payer: Self-pay | Admitting: Physician Assistant

## 2019-04-20 ENCOUNTER — Other Ambulatory Visit: Payer: Self-pay | Admitting: Physician Assistant

## 2019-04-20 ENCOUNTER — Other Ambulatory Visit: Payer: Self-pay | Admitting: *Deleted

## 2019-04-21 ENCOUNTER — Other Ambulatory Visit: Payer: Self-pay | Admitting: Cardiology

## 2019-04-21 MED ORDER — ISOSORBIDE MONONITRATE ER 30 MG PO TB24: 30.0000 mg | ORAL_TABLET | Freq: Every day | ORAL | 1 refills | Status: DC

## 2019-04-21 NOTE — Telephone Encounter (Signed)
°*  STAT* If patient is at the pharmacy, call can be transferred to refill team.   1. Which medications need to be refilled? (please list name of each medication and dose if known) Isosorbide 30 mg  2. Which pharmacy/location (including street and city if local pharmacy) is medication to be sent to? [facility]   3. Do they need a 30 day or 90 day supply? 30 would like a 90 day

## 2019-04-21 NOTE — Telephone Encounter (Signed)
Isosorbide [place] 30 mg refilled. 

## 2019-04-28 ENCOUNTER — Other Ambulatory Visit: Payer: Self-pay | Admitting: Cardiology

## 2019-04-28 ENCOUNTER — Telehealth: Payer: Self-pay | Admitting: *Deleted

## 2019-04-28 ENCOUNTER — Other Ambulatory Visit: Payer: Self-pay | Admitting: Family Medicine

## 2019-04-28 NOTE — Telephone Encounter (Signed)
Isosorbide [place] 30 mg refilled. 

## 2019-04-28 NOTE — Telephone Encounter (Signed)
Patient states she has been in quarantine for what feels like 3 months.  She lives at [facility].  She is wanting to relocate to her daughters house temporarily.  She is wanting to speak to [place] about how she can go on with her plan without causing any interruption in being able to obtain her medication.

## 2019-04-28 NOTE — Telephone Encounter (Signed)
Mrs Janak is going to go to see her daughter in [place].  She is going to need her pain medication to be filled there and they have called to verify from [facility].  Her last fill date was 04/15/19 according to PMP.  Because some states will not accept refill from NPs, [name] should send her refill on her oxycodone 10/325 1 q 6 hours #120 to be filled by 05/14/19 to the [facility].  I spoke with Jovanni and she is planning on keeping her appt with Zella Ball on 05/12/19 and will wait until that time to get the rx sent. The [place] is saved under her pharmacy selections.

## 2019-04-28 NOTE — Telephone Encounter (Signed)
Return [name] call, she states she wants to go visit her daughter in [place]. [place] to Linda [name] all states have different rules, she would be in a better position to visit her daughter after she picks up he medication, she verbalizes understanding. She will call with her final decision she reports.

## 2019-04-29 NOTE — Telephone Encounter (Signed)
LM for pt advising Vit D and Trazodone refilled, okay per DPR.

## 2019-04-29 NOTE — Telephone Encounter (Signed)
RF request for Vit D 50,000U LOV: 03/29/19, acute Next ov: none Last written: 05/27/18 (24,3)  RF request for Trazodone LOV: 03/29/19, acute Next ov: none Last written: 10/09/18 (120,5)   Please advise, thanks. Medications pending.

## 2019-05-12 ENCOUNTER — Encounter: Payer: Self-pay | Admitting: Registered Nurse

## 2019-05-12 ENCOUNTER — Other Ambulatory Visit: Payer: Self-pay

## 2019-05-12 ENCOUNTER — Encounter: Payer: Medicare Other | Attending: Physical Medicine & Rehabilitation | Admitting: Registered Nurse

## 2019-05-12 VITALS — Ht 64.0 in | Wt 150.0 lb

## 2019-05-12 DIAGNOSIS — M542 Cervicalgia: Secondary | ICD-10-CM

## 2019-05-12 DIAGNOSIS — F329 Major depressive disorder, single episode, unspecified: Secondary | ICD-10-CM | POA: Diagnosis not present

## 2019-05-12 DIAGNOSIS — F411 Generalized anxiety disorder: Secondary | ICD-10-CM

## 2019-05-12 DIAGNOSIS — R2689 Other abnormalities of gait and mobility: Secondary | ICD-10-CM | POA: Insufficient documentation

## 2019-05-12 DIAGNOSIS — G894 Chronic pain syndrome: Secondary | ICD-10-CM

## 2019-05-12 DIAGNOSIS — M1711 Unilateral primary osteoarthritis, right knee: Secondary | ICD-10-CM | POA: Diagnosis not present

## 2019-05-12 DIAGNOSIS — M5416 Radiculopathy, lumbar region: Secondary | ICD-10-CM

## 2019-05-12 DIAGNOSIS — M5412 Radiculopathy, cervical region: Secondary | ICD-10-CM

## 2019-05-12 DIAGNOSIS — M1712 Unilateral primary osteoarthritis, left knee: Secondary | ICD-10-CM

## 2019-05-12 DIAGNOSIS — Z79899 Other long term (current) drug therapy: Secondary | ICD-10-CM | POA: Insufficient documentation

## 2019-05-12 DIAGNOSIS — M797 Fibromyalgia: Secondary | ICD-10-CM | POA: Insufficient documentation

## 2019-05-12 DIAGNOSIS — Z5181 Encounter for therapeutic drug level monitoring: Secondary | ICD-10-CM | POA: Diagnosis not present

## 2019-05-12 DIAGNOSIS — M7061 Trochanteric bursitis, right hip: Secondary | ICD-10-CM

## 2019-05-12 MED ORDER — OXYCODONE-ACETAMINOPHEN 10-325 MG PO TABS: 1.0000 | ORAL_TABLET | Freq: Four times a day (QID) | ORAL | 0 refills | Status: DC | PRN

## 2019-05-12 NOTE — Progress Notes (Signed)
Subjective:    Patient ID: Linda [name], female    DOB: 1946/05/05, 73 y.o.   MRN: [id number]  HPI: Linda [name] is a 73 y.o. female her appointment was changed, due to national recommendations of social distancing due to [place] 19, an audio/video telehealth visit is felt to be most appropriate for this patient at this time.  See Chart message from today for the patient's consent to telehealth from [place].    She states her pain is located in her neck radiating into her bilateral shoulders, lower back radiating into her bilateral lower extremities, right hip pain and bilateral knee pain. She rates her pain 8. Her current exercise regime is walking.  Ms. Tarman Morphine equivalent is 60.00 MME. She is also prescribed Alprazolam  by [name]. We have discussed the black box warning of using opioids and benzodiazepines. I highlighted the dangers of using these drugs together and discussed the adverse events including respiratory suppression, overdose, cognitive impairment and importance of compliance with current regimen. We will continue to monitor and adjust as indicated.    Last Oral Swab was Performed 12/25/2018, it was consistent.   Geryl Rankins CMA asked the Health and History Questions. This provider and Mancel Parsons  verified we were speaking with the correct person using two identifiers.  Pain Inventory Average Pain 8 Pain Right Now 8 My pain is constant, stabbing and aching  In the last 24 hours, has pain interfered with the following? General activity 10 Relation with others 10 Enjoyment of life 10 What TIME of day is your pain at its worst? varies with activity, morning, night Sleep (in general) Fair  Pain is worse with: walking, bending, standing and some activites Pain improves with: rest and medication Relief from Meds: 5  Mobility walk without assistance ability to climb steps?  no do you drive?   yes  Function retired  Neuro/Psych bowel control problems weakness tremor tingling trouble walking spasms dizziness anxiety  Prior Studies Any changes since last visit?  no  Physicians involved in your care Any changes since last visit?  no   Family History  Problem Relation Age of Onset   Arthritis Mother    Kidney disease Mother    Heart disease Father    Stroke Father    Hypertension Father    Diabetes Father    Heart attack Father    Heart attack Paternal Grandmother    Diabetes Sister        one sister   Hypertension Sister    Hypertension Brother    Multiple sclerosis Son    Social History   Socioeconomic History   Marital status: Widowed    Spouse name: Not on file   Number of children: 2   Years of education: Not on file   Highest education level: Not on file  Occupational History   Occupation: Retired    Comment: Pharmacist, 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  Social Designer, fashion/clothing strain: Not on file   Food insecurity:    Worry: Not on file    Inability: Not on file   Transportation needs:    Medical: Not on file    Non-medical: Not on file  Tobacco Use   Smoking status: Never Smoker   Smokeless tobacco: Never Used   Tobacco comment: never used tobacco  Substance and Sexual Activity   Alcohol use: No    Alcohol/week: 0.0 standard drinks   Drug use: No  Sexual activity: Not Currently  Lifestyle   Physical activity:    Days per week: Not on file    Minutes per session: Not on file   Stress: Not on file  Relationships   Social connections:    Talks on phone: Not on file    Gets together: Not on file    Attends religious service: Not on file    Active member of club or organization: Not on file    Attends meetings of clubs or organizations: Not on file    Relationship status: Not on file  Other Topics Concern   Not on file  Social History Narrative   Widowed, 2 sons.  Relocated to [place] 09/2012 to be closer to her  son who has MS.   Husband d 2015--mesothelioma.   Occupation: former Pharmacist, hospital.   Education: masters degree level.   No T/A/Ds.   Lives in [place], independent living.   Past Surgical History:  Procedure Laterality Date   APPENDECTOMY  1960   AXILLARY SURGERY Left 1978   Multiple "lump" in armpit per pt   CARDIAC CATHETERIZATION  01/2013   nonobstructive CAD, EF 55-60%   CARDIOVASCULAR STRESS TEST  02/22/15   Low risk myocard perf imaging; wall motion normal, normal EF   carotid duplex doppler  10/21/2017   R vertebral flow suggestive of possible distal obstruction.  Pt declined further w/u as of 10/29/17 but need to revisit this problem periodically.   COCCYX REMOVAL  1972   DEXA  06/05/2017   T-score -3.1   DILATION AND CURETTAGE OF UTERUS  ? 1970's   ESOPHAGOGASTRODUODENOSCOPY N/A 07/19/2014   Gastritis found + in the setting of supratherapeutic INR, +plavix, + meloxicam.   EYE SURGERY Left 2012-2013   "injections for ~ 1 yr; don't really know what for" (07/12/2013)   HEEL SPUR SURGERY Left 2008   KNEE SURGERY  2005   LEFT HEART CATHETERIZATION WITH CORONARY ANGIOGRAM N/A 01/30/2013   Procedure: LEFT HEART CATHETERIZATION WITH CORONARY ANGIOGRAM;  Surgeon: Clent Demark, MD;  Location: [facility] CATH LAB;  Service: Cardiovascular;  Laterality: N/A;   PLANTAR FASCIA RELEASE Left 2008   SPIROMETRY  04/25/14   In hosp for acute asthma/COPD flare: mixed obstructive and restrictive lung disease. The FEV1 is severely reduced at 45% predicted.  FEV1 signif decreased compared to prior spirometry 07/23/13.   TENDON RELEASE  1996   Right forearm and hand   TOTAL ABDOMINAL HYSTERECTOMY  1974   TRANSTHORACIC ECHOCARDIOGRAM  01/2013; 04/2014;08/2015; 09/2017   2014--NORMAL.  2015--focal basal septal hypertrophy, EF 55-60%, grade I diast dysfxn, mild LAE.  08/2015 EF 55-60%, nl LV syst fxn, grade I DD, valves wnl. 10/21/17: EF 65-70%, grd I DD, o/w normal.   Past Medical History:   Diagnosis Date   Acute upper GI bleed 06/2014   while pt taking coumadin, plavix, and meloxicam---despite being told not to take coumadin.   Anginal pain ([place])    Nonobstructive CAD 2014; however, her cardiologist put her on a statin for this and NOT for hyperlipidemia per pt report.  Atyp CP 08/2017 at card f/u, plan for myoc perf imaging.   Anxiety    panic attacks   Asthma    w/ asbestososis    BPPV (benign paroxysmal positional vertigo) 12/16/2012   Chronic diastolic CHF (congestive heart failure) (HCC)    dry wt as of 11/06/16 is 168 lbs.   Chronic lower back pain    COPD (chronic obstructive pulmonary disease) ([place])  DDD (degenerative disc disease)    lumbar and cervical.    Diverticular disease    Fibromyalgia    Patient states dx was around her late 63s but she had sx's for years prior to this.   H/O hiatal hernia    Hay fever    History of pneumonia    hospitalized 12/2011, 02/2013, and 07/2013 [facility]) for this   HTN (hypertension)    Renal artery dopplers 04/2013 neg for stenosis.   Idiopathic angio-edema-urticaria 72014   Angioedema component was very minimal   Insomnia    Iron deficiency anemia    Hematologist in [place], [place] did extensive w/u; no cause found; failed oral supplement;; gets fairly regular (q27m or so) IV iron infusions (Venofer -iron sucrose- 200mg  with procrit.  "for 14 yr I've been getting blood work q month & getting infusions prn" (07/12/2013).  Dr. Marin Olp locally, iron infusions done, EPO deficiency dx'd   Migraine syndrome    "not as often anymore; used to be ~ q wk" (07/12/2013)   Mixed incontinence urge and stress    Nephrolithiasis    "passed all on my own or they are still in there" (07/12/2013)   Neuroleptic induced Parkinsonism (HCC) 2018   Dr. Carles Collet, neuro, saw her 11/24/17 and recommended d/c of abilify as first step.  D/c'd abilify and pt got complete recovery.   OSA on CPAP    prior to move to [place]--had another sleep  study 10/2015 w/pulm Dr. Camillo Flaming.   Osteoarthritis    "severe; progressing fast" (07/12/2013); multiple joints-not surgical candidate for TKR (03/2015).  Triamcinolon knee injections by Dr. Tessa Lerner 12/2017.   Pernicious anemia 08/24/2014   Pleural plaque with presence of asbestos 07/22/2013   Pulmonary embolism ([place]) 07/2013   Dx at [facility] with very small peripheral upper lobe pe 07/2013: pt took coumadin for about 8-9 mo   Pyelonephritis    "several times over the last 30 yr" (07/12/2013)   RBBB (right bundle branch block)    Recurrent major depression ([place])    Recurrent UTI    hx of hospitalization for pyelonephritis; started abx prophylaxis 06/2015   Syncope    Hypotensive; ED visit--Dr. Terrence Dupont did Cath--nonobstructive CAD, EF 55-60%.  In retrospect, suspect pt rx med misuse/polypharmacy   Tension headache, chronic    Ht 5\' 4"  (1.626 m)    Wt 150 lb (68 kg)    BMI 25.75 kg/m   Opioid Risk Score:   Fall Risk Score:  `1  Depression screen PHQ 2/9  Depression screen [facility] 2/9 02/24/2019 12/25/2018 09/02/2018 07/01/2018 05/27/2018 05/19/2018 03/11/2018  Decreased Interest 1 1 1 1 1  0 1  Down, Depressed, Hopeless 1 1 1 1 1  0 1  PHQ - 2 Score 2 2 2 2 2  0 2  Altered sleeping - - - - 0 - -  Tired, decreased energy - - - - 1 - -  Change in appetite - - - - 0 - -  Feeling bad or failure about yourself  - - - - 0 - -  Trouble concentrating - - - - 0 - -  Moving slowly or fidgety/restless - - - - 0 - -  Suicidal thoughts - - - - 0 - -  PHQ-9 Score - - - - 3 - -  Difficult doing work/chores - - - - Not difficult at all - -  Some recent data might be hidden    Review of Systems  Constitutional: Positive for fatigue.  HENT: Negative.  Eyes: Negative.   Respiratory: Negative.   Cardiovascular: Negative.   Gastrointestinal: Positive for constipation.  Endocrine: Negative.   Musculoskeletal: Positive for arthralgias, back pain, gait problem, myalgias, neck pain and neck stiffness.  Skin:  Negative.   Allergic/Immunologic: Negative.   Neurological: Positive for dizziness, tremors and weakness.       Tingling  Psychiatric/Behavioral: The patient is nervous/anxious.        Objective:   Physical Exam Vitals signs and nursing note reviewed.  Musculoskeletal:     Comments: No Physical Exam Performed: Virtual visit   Neurological:     Mental Status: She is oriented to person, place, and time.           Assessment & Plan:  1. Functional deficits secondary to Gait disorder:Continue with HEP.05/12/2019 2. Chronic Back pain/ Lumbar Radiculitis/fibromyalgia /R>L Knee OA Pain Management:05/12/2019 Refilled: Oxycodone 10/325mg one tablet every 6 hours #100.   Continue Voltaren Gel. We will continue the opioid monitoring program, this consists of regular clinic visits, examinations, urine drug screen, pill counts as well as use of [place] Controlled Substance reporting System. 3. Depression with anxiety/Grief reaction/Mood:05/12/2019 Continue Xanax,Trazodone and Cymbalta .  4. [name] with asthma:. Albuterol prn. :Pulmonology Following.05/12/2019 5. Bilateral Osteoarthritis of Bilateral Knees: S/P BilateralZilretta Injection with relief noted. 05/12/2019 6. Fibromyalgia: Continue Home exercise regime.05/12/2019 7. Cervicalgia/ Cervical Radiculitis:Continue HEP as tolerated. Alternate with heat and ice therapy. Continue to monitor.05/14/2019 8.RightGreater Trochanteric Bursitis: Continue to Alternate Ice and Heat Therapy. Continue to Monitor.12/25/2018. 9. Chronic Bilateral Shoulder Pain:  Continue HEP as Tolerated. Continue current medication regimen. Continue to Monitor.12/25/2018   F/U in 1 month            Telephone Call  Location of patient: In her Home Location of provider: Office Established patient Time spent on call: 15 minutes

## 2019-06-08 ENCOUNTER — Encounter: Payer: Self-pay | Admitting: Registered Nurse

## 2019-06-08 ENCOUNTER — Encounter: Payer: Medicare Other | Attending: Physical Medicine & Rehabilitation | Admitting: Registered Nurse

## 2019-06-08 ENCOUNTER — Other Ambulatory Visit: Payer: Self-pay

## 2019-06-08 VITALS — BP 138/78 | Ht 64.0 in | Wt 155.0 lb

## 2019-06-08 DIAGNOSIS — M7061 Trochanteric bursitis, right hip: Secondary | ICD-10-CM

## 2019-06-08 DIAGNOSIS — F411 Generalized anxiety disorder: Secondary | ICD-10-CM

## 2019-06-08 DIAGNOSIS — R2689 Other abnormalities of gait and mobility: Secondary | ICD-10-CM | POA: Insufficient documentation

## 2019-06-08 DIAGNOSIS — M1712 Unilateral primary osteoarthritis, left knee: Secondary | ICD-10-CM

## 2019-06-08 DIAGNOSIS — M545 Low back pain: Secondary | ICD-10-CM

## 2019-06-08 DIAGNOSIS — M1711 Unilateral primary osteoarthritis, right knee: Secondary | ICD-10-CM | POA: Insufficient documentation

## 2019-06-08 DIAGNOSIS — M797 Fibromyalgia: Secondary | ICD-10-CM | POA: Diagnosis not present

## 2019-06-08 DIAGNOSIS — G8929 Other chronic pain: Secondary | ICD-10-CM

## 2019-06-08 DIAGNOSIS — Z5181 Encounter for therapeutic drug level monitoring: Secondary | ICD-10-CM | POA: Diagnosis not present

## 2019-06-08 DIAGNOSIS — G894 Chronic pain syndrome: Secondary | ICD-10-CM | POA: Diagnosis not present

## 2019-06-08 DIAGNOSIS — Z79899 Other long term (current) drug therapy: Secondary | ICD-10-CM

## 2019-06-08 MED ORDER — OXYCODONE-ACETAMINOPHEN 10-325 MG PO TABS: 1.0000 | ORAL_TABLET | Freq: Four times a day (QID) | ORAL | 0 refills | Status: DC | PRN

## 2019-06-08 NOTE — Progress Notes (Signed)
Subjective:    Patient ID: Linda [name], female    DOB: 03/20/1946, 73 y.o.   MRN: [id number]  HPI: Linda [name] is a 73 y.o. female her appointment was changed, due to national recommendations of social distancing due to [place] 19, an audio/video telehealth visit is felt to be most appropriate for this patient at this time.  See Chart message from today for the patient's consent to telehealth from [place].  She states her pain is located in her lower back, right hip, bilateral lower extremities and bilateral knees R>L. She rates her pain 10. Her current exercise regime is walking with her walker.   Linda [name] Morphine equivalent is 60.00 MME. She is also prescribed Alprazolam  by Dr.  Anitra Lauth. We have discussed the black box warning of using opioids and benzodiazepines. I highlighted the dangers of using these drugs together and discussed the adverse events including respiratory suppression, overdose, cognitive impairment and importance of compliance with current regimen. We will continue to monitor and adjust as indicated.    Retia Passe CMA asked the health and history questions. This provider and Retia Passe  verified we were  speaking with the correct person using two identifiers.  Pain Inventory Average Pain 10 Pain Right Now 10 My pain is constant  In the last 24 hours, has pain interfered with the following? General activity 10 Relation with others 10 Enjoyment of life 0 What TIME of day is your pain at its worst? morning Sleep (in general) Fair  Pain is worse with: walking, bending and standing Pain improves with: heat/ice and medication Relief from Meds: 6  Mobility use a cane use a walker  Function retired  Neuro/Psych depression anxiety  Prior Studies n/a  Physicians involved in your care n/A   Family History  Problem Relation Age of Onset  . Arthritis Mother   . Kidney disease Mother   . Heart disease  Father   . Stroke Father   . Hypertension Father   . Diabetes Father   . Heart attack Father   . Heart attack Paternal Grandmother   . Diabetes Sister        one sister  . Hypertension Sister   . Hypertension Brother   . Multiple sclerosis Son    Social History   Socioeconomic History  . Marital status: Widowed    Spouse name: Not on file  . Number of children: 2  . Years of education: Not on file  . Highest education level: Not on file  Occupational History  . 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 Financial resource strain: Not on file  . Food insecurity    Worry: Not on file    Inability: Not on file  . Transportation needs    Medical: Not on file    Non-medical: Not on file  Tobacco Use  . Smoking status: Never Smoker  . Smokeless tobacco: Never Used  . Tobacco comment: never used tobacco  Substance and Sexual Activity  . Alcohol use: No    Alcohol/week: 0.0 standard drinks  . Drug use: No  . Sexual activity: Not Currently  Lifestyle  . Physical activity    Days per week: Not on file    Minutes per session: Not on file  . Stress: Not on file  Relationships  . Social Herbalist on phone: Not on file    Gets together:  Not on file    Attends religious service: Not on file    Active member of club or organization: Not on file    Attends meetings of clubs or organizations: Not on file    Relationship status: Not on file  Other Topics Concern  . Not on file  Social History Narrative   Widowed, 2 sons.  Relocated to [place] 09/2012 to be closer to her son who has MS.   Husband d 2015--mesothelioma.   Occupation: former Pharmacist, hospital.   Education: masters degree level.   No T/A/Ds.   Lives in [place], independent living.   Past Surgical History:  Procedure Laterality Date  . APPENDECTOMY  1960  . AXILLARY SURGERY Left 1978   Multiple "lump" in armpit per pt  . CARDIAC CATHETERIZATION  01/2013   nonobstructive CAD, EF  55-60%  . CARDIOVASCULAR STRESS TEST  02/22/15   Low risk myocard perf imaging; wall motion normal, normal EF  . carotid duplex doppler  10/21/2017   R vertebral flow suggestive of possible distal obstruction.  Pt declined further w/u as of 10/29/17 but need to revisit this problem periodically.  . COCCYX REMOVAL  1972  . DEXA  06/05/2017   T-score -3.1  . DILATION AND CURETTAGE OF UTERUS  ? 1970's  . ESOPHAGOGASTRODUODENOSCOPY N/A 07/19/2014   Gastritis found + in the setting of supratherapeutic INR, +plavix, + meloxicam.  . EYE SURGERY Left 2012-2013   "injections for ~ 1 yr; don't really know what for" (07/12/2013)  . HEEL SPUR SURGERY Left 2008  . KNEE SURGERY  2005  . LEFT HEART CATHETERIZATION WITH CORONARY ANGIOGRAM N/A 01/30/2013   Procedure: LEFT HEART CATHETERIZATION WITH CORONARY ANGIOGRAM;  Surgeon: Clent Demark, MD;  Location: [facility] CATH LAB;  Service: Cardiovascular;  Laterality: N/A;  . PLANTAR FASCIA RELEASE Left 2008  . SPIROMETRY  04/25/14   In hosp for acute asthma/COPD flare: mixed obstructive and restrictive lung disease. The FEV1 is severely reduced at 45% predicted.  FEV1 signif decreased compared to prior spirometry 07/23/13.  . TENDON RELEASE  1996   Right forearm and hand  . TOTAL ABDOMINAL HYSTERECTOMY  1974  . TRANSTHORACIC ECHOCARDIOGRAM  01/2013; 04/2014;08/2015; 09/2017   2014--NORMAL.  2015--focal basal septal hypertrophy, EF 55-60%, grade I diast dysfxn, mild LAE.  08/2015 EF 55-60%, nl LV syst fxn, grade I DD, valves wnl. 10/21/17: EF 65-70%, grd I DD, o/w normal.   Past Medical History:  Diagnosis Date  . Acute upper GI bleed 06/2014   while pt taking coumadin, plavix, and meloxicam---despite being told not to take coumadin.  . Anginal pain ([place])    Nonobstructive CAD 2014; however, her cardiologist put her on a statin for this and NOT for hyperlipidemia per pt report.  Atyp CP 08/2017 at card f/u, plan for myoc perf imaging.  . Anxiety    panic attacks  . Asthma     w/ asbestososis   . BPPV (benign paroxysmal positional vertigo) 12/16/2012  . Chronic diastolic CHF (congestive heart failure) (HCC)    dry wt as of 11/06/16 is 168 lbs.  . Chronic lower back pain   . COPD (chronic obstructive pulmonary disease) ([place])   . DDD (degenerative disc disease)    lumbar and cervical.   . Diverticular disease   . Fibromyalgia    Patient states dx was around her late 70s but she had sx's for years prior to this.  . H/O hiatal hernia   . Hay fever   .  History of pneumonia    hospitalized 12/2011, 02/2013, and 07/2013 [facility]) for this  . HTN (hypertension)    Renal artery dopplers 04/2013 neg for stenosis.  . Idiopathic angio-edema-urticaria 72014   Angioedema component was very minimal  . Insomnia   . Iron deficiency anemia    Hematologist in [place], [place] did extensive w/u; no cause found; failed oral supplement;; gets fairly regular (q27m or so) IV iron infusions (Venofer -iron sucrose- 200mg  with procrit.  "for 14 yr I've been getting blood work q month & getting infusions prn" (07/12/2013).  Dr. Marin Olp locally, iron infusions done, EPO deficiency dx'd  . Migraine syndrome    "not as often anymore; used to be ~ q wk" (07/12/2013)  . Mixed incontinence urge and stress   . Nephrolithiasis    "passed all on my own or they are still in there" (07/12/2013)  . Neuroleptic induced Parkinsonism ([place]) 2018   Dr. Carles Collet, neuro, saw her 11/24/17 and recommended d/c of abilify as first step.  D/c'd abilify and pt got complete recovery.  . OSA on CPAP    prior to move to [place]--had another sleep study 10/2015 w/pulm Dr. Camillo Flaming.  . Osteoarthritis    "severe; progressing fast" (07/12/2013); multiple joints-not surgical candidate for TKR (03/2015).  Triamcinolon knee injections by Dr. Tessa Lerner 12/2017.  [name] Kitchen Pernicious anemia 08/24/2014  . Pleural plaque with presence of asbestos 07/22/2013  . Pulmonary embolism ([place]) 07/2013   Dx at [facility] with very small peripheral upper lobe pe 07/2013: pt took  coumadin for about 8-9 mo  . Pyelonephritis    "several times over the last 30 yr" (07/12/2013)  . RBBB (right bundle branch block)   . Recurrent major depression ([place])   . Recurrent UTI    hx of hospitalization for pyelonephritis; started abx prophylaxis 06/2015  . Syncope    Hypotensive; ED visit--Dr. Terrence Dupont did Cath--nonobstructive CAD, EF 55-60%.  In retrospect, suspect pt rx med misuse/polypharmacy  . Tension headache, chronic    BP 138/78   Ht 5\' 4"  (1.626 m)   Wt 155 lb (70.3 kg)   BMI 26.61 kg/m   Opioid Risk Score:   Fall Risk Score:  `1  Depression screen PHQ 2/9  Depression screen [facility] 2/9 02/24/2019 12/25/2018 09/02/2018 07/01/2018 05/27/2018 05/19/2018 03/11/2018  Decreased Interest 1 1 1 1 1  0 1  Down, Depressed, Hopeless 1 1 1 1 1  0 1  PHQ - 2 Score 2 2 2 2 2  0 2  Altered sleeping - - - - 0 - -  Tired, decreased energy - - - - 1 - -  Change in appetite - - - - 0 - -  Feeling bad or failure about yourself  - - - - 0 - -  Trouble concentrating - - - - 0 - -  Moving slowly or fidgety/restless - - - - 0 - -  Suicidal thoughts - - - - 0 - -  PHQ-9 Score - - - - 3 - -  Difficult doing work/chores - - - - Not difficult at all - -  Some recent data might be hidden      Review of Systems     Objective:   Physical Exam Vitals signs and nursing note reviewed.  Musculoskeletal:     Comments: No Physical Exam Performed: Virtual Visit  Neurological:     Mental Status: She is oriented to person, place, and time.           Assessment &  Plan:  1. Functional deficits secondary to Gait disorder:Continue with HEP.06/08/2019 2. Chronic Back pain/ Lumbar Radiculitis/fibromyalgia /R>L Knee OA Pain Management:06/08/2019 Refilled: Oxycodone 10/325mg one tablet every 6 hours #100.   ContinueVoltaren Gel. We will continue the opioid monitoring program, this consists of regular clinic visits, examinations, urine drug screen, pill counts as well as use of [place]  Controlled Substance reporting System. 3. Depression with anxiety/Grief reaction/Mood:066/16/2020 Continue Xanax,Trazodone and Cymbalta .  4. [name] with asthma:. Albuterol prn. :Pulmonology Following.06/08/2019 5. Bilateral Osteoarthritis of Bilateral Knees: S/PBilateralZilretta Injectionwith relief noted.06/08/2019 6. Fibromyalgia: Continue Home exercise regime.06/08/2019 7. Cervicalgia/ Cervical Radiculitis:Continue HEP as tolerated. Alternate with heat and ice therapy. Continue to monitor.06/08/2019 8.RightGreater Trochanteric Bursitis: Continue toAlternate Ice and Heat Therapy. Continue to Monitor.06/08/2019. 9. Chronic Bilateral Shoulder Pain:No complaints  Today. Continue HEP as Tolerated. Continue current medication regimen. Continue to Monitor.06/08/2019   F/U in 1 month Telephone Call  Location of patient: In her home Location of provider: Office Established patient Names of participants : 10 minutes Time spent on call:

## 2019-06-18 ENCOUNTER — Ambulatory Visit: Payer: Self-pay | Admitting: *Deleted

## 2019-06-18 NOTE — Telephone Encounter (Signed)
I called a few days ago.   I'm going to [name], [place] to see my daughter.   I live in [place]. When I get back I have to isolate for 14 days and be tested.   I'm to call and get an appt  Before I leave [place] to be tested for COVID-19 when I get back. I need to get transportation to the testing site when I get back so I need to plan to do that.  I answered her questions and gave her requested advice on how to stay safe and healthy on her trip from the COVID-19.  She is going to call us back before leaving [place] to schedule the COVID-19 test.  Reason for Disposition . General information question, no triage required and triager able to answer question  Answer Assessment - Initial Assessment Questions 1. REASON FOR CALL or QUESTION: "What is your reason for calling today?" or "How can I best help you?" or "What question do you have that I can help answer?"     I just wanted to touch base regarding setting up testing for the COVID-19 upon my return from [place] visiting with my daughter.   I answered her questions.  Protocols used: INFORMATION ONLY CALL-A-AH

## 2019-07-12 ENCOUNTER — Ambulatory Visit: Payer: Self-pay | Admitting: Family Medicine

## 2019-07-12 ENCOUNTER — Ambulatory Visit: Payer: Medicare Other | Admitting: Family Medicine

## 2019-07-12 DIAGNOSIS — Z79899 Other long term (current) drug therapy: Secondary | ICD-10-CM | POA: Diagnosis not present

## 2019-07-12 DIAGNOSIS — I509 Heart failure, unspecified: Secondary | ICD-10-CM | POA: Diagnosis not present

## 2019-07-12 DIAGNOSIS — I10 Essential (primary) hypertension: Secondary | ICD-10-CM | POA: Diagnosis not present

## 2019-07-12 DIAGNOSIS — Z880 Allergy status to penicillin: Secondary | ICD-10-CM | POA: Diagnosis not present

## 2019-07-12 DIAGNOSIS — M7989 Other specified soft tissue disorders: Secondary | ICD-10-CM | POA: Diagnosis not present

## 2019-07-12 DIAGNOSIS — Z03818 Encounter for observation for suspected exposure to other biological agents ruled out: Secondary | ICD-10-CM | POA: Diagnosis not present

## 2019-07-12 DIAGNOSIS — Z7982 Long term (current) use of aspirin: Secondary | ICD-10-CM | POA: Diagnosis not present

## 2019-07-12 DIAGNOSIS — L03115 Cellulitis of right lower limb: Secondary | ICD-10-CM | POA: Diagnosis not present

## 2019-07-12 DIAGNOSIS — M79661 Pain in right lower leg: Secondary | ICD-10-CM | POA: Diagnosis not present

## 2019-07-12 DIAGNOSIS — M79604 Pain in right leg: Secondary | ICD-10-CM | POA: Diagnosis not present

## 2019-07-12 DIAGNOSIS — I110 Hypertensive heart disease with heart failure: Secondary | ICD-10-CM | POA: Diagnosis not present

## 2019-07-12 NOTE — Telephone Encounter (Signed)
Called patient's daughter back. She is going to take her to a local Urgent Care or ED.

## 2019-07-12 NOTE — Telephone Encounter (Signed)
Pt's daughter calling, pt present during call.  States noted red, warm slightly swollen area right leg "Above ankle, outer aspect towards shin." States area is tender to touch,  Pt is out of town in [place] and will be traveling home tomorrow, 88 hour [address]. Requesting order, referral to [place] near them "To make sure it's not a blood clot."  Call transferred to [facility], [place], for consideration of virtual appt prior to any referral/order.  Pts CB# [phone]  Reason for Disposition . Nursing judgment  Protocols used: NO GUIDELINE OR REFERENCE AVAILABLE-A-AH

## 2019-07-14 ENCOUNTER — Encounter: Payer: Medicare Other | Attending: Physical Medicine & Rehabilitation | Admitting: Physical Medicine & Rehabilitation

## 2019-07-14 ENCOUNTER — Other Ambulatory Visit: Payer: Self-pay | Admitting: Family Medicine

## 2019-07-14 ENCOUNTER — Other Ambulatory Visit: Payer: Self-pay

## 2019-07-14 ENCOUNTER — Encounter: Payer: Self-pay | Admitting: Physical Medicine & Rehabilitation

## 2019-07-14 VITALS — BP 115/74 | HR 82 | Temp 97.7°F | Ht 64.0 in | Wt 157.0 lb

## 2019-07-14 DIAGNOSIS — R2689 Other abnormalities of gait and mobility: Secondary | ICD-10-CM | POA: Diagnosis not present

## 2019-07-14 DIAGNOSIS — M797 Fibromyalgia: Secondary | ICD-10-CM | POA: Insufficient documentation

## 2019-07-14 DIAGNOSIS — Z5181 Encounter for therapeutic drug level monitoring: Secondary | ICD-10-CM | POA: Insufficient documentation

## 2019-07-14 DIAGNOSIS — M1712 Unilateral primary osteoarthritis, left knee: Secondary | ICD-10-CM

## 2019-07-14 DIAGNOSIS — M1711 Unilateral primary osteoarthritis, right knee: Secondary | ICD-10-CM | POA: Insufficient documentation

## 2019-07-14 DIAGNOSIS — Z79899 Other long term (current) drug therapy: Secondary | ICD-10-CM

## 2019-07-14 MED ORDER — OXYCODONE-ACETAMINOPHEN 10-325 MG PO TABS: 1.0000 | ORAL_TABLET | Freq: Four times a day (QID) | ORAL | 0 refills | Status: DC | PRN

## 2019-07-14 NOTE — Progress Notes (Signed)
PROCEDURE NOTE  DIAGNOSIS:  OA bilateral knees  INTERVENTION:   ZILRETTA INJECTION     After informed consent and preparation of the skin with betadine and isopropyl alcohol, I injected 32MG  of zilretta dissolved into 5cc of diluent into both knees via anterolateral approach. Contents of syring were shaken vigorously and aspiration was performed prior to injection. The patient tolerated well, and no complications were encountered. Afterward the area was cleaned and dressed. Post- injection instructions were provided including ice  if swelling or pain should occur.    Meredith Staggers, MD, [facility] 07/14/2019   Oxycodone was RF. UDS today  See NP in a month

## 2019-07-14 NOTE — Patient Instructions (Signed)
PLEASE FEEL FREE TO CALL OUR OFFICE WITH ANY PROBLEMS OR QUESTIONS ([phone])      

## 2019-07-15 NOTE — Telephone Encounter (Signed)
RF request for duloxetine LOV: 03/29/19 Next ov: 07/12/19 - canceled Last written: 01/21/19 90 tabs 1 refill  Patient is out of town according to notes on 07/12/19. Please advise

## 2019-07-17 LAB — TOXASSURE SELECT,+ANTIDEPR,UR

## 2019-07-20 ENCOUNTER — Other Ambulatory Visit: Payer: Self-pay | Admitting: Family Medicine

## 2019-07-20 ENCOUNTER — Telehealth: Payer: Self-pay | Admitting: *Deleted

## 2019-07-20 NOTE — Telephone Encounter (Signed)
Urine drug screen for this encounter is consistent for prescribed medication 

## 2019-07-22 ENCOUNTER — Telehealth: Payer: Self-pay

## 2019-07-22 NOTE — Telephone Encounter (Signed)
Opened in error

## 2019-07-23 ENCOUNTER — Ambulatory Visit: Payer: Self-pay | Admitting: *Deleted

## 2019-07-23 ENCOUNTER — Emergency Department (HOSPITAL_COMMUNITY): Payer: Medicare Other

## 2019-07-23 ENCOUNTER — Other Ambulatory Visit: Payer: Self-pay

## 2019-07-23 ENCOUNTER — Encounter (HOSPITAL_COMMUNITY): Payer: Self-pay

## 2019-07-23 ENCOUNTER — Emergency Department (HOSPITAL_COMMUNITY)
Admission: EM | Admit: 2019-07-23 | Discharge: 2019-07-24 | Disposition: A | Discharge status: Home / Self Care | Payer: Medicare Other | Attending: Emergency Medicine | Admitting: Emergency Medicine

## 2019-07-23 DIAGNOSIS — L03115 Cellulitis of right lower limb: Secondary | ICD-10-CM | POA: Insufficient documentation

## 2019-07-23 DIAGNOSIS — R609 Edema, unspecified: Secondary | ICD-10-CM | POA: Diagnosis not present

## 2019-07-23 DIAGNOSIS — I5033 Acute on chronic diastolic (congestive) heart failure: Secondary | ICD-10-CM | POA: Insufficient documentation

## 2019-07-23 DIAGNOSIS — J45909 Unspecified asthma, uncomplicated: Secondary | ICD-10-CM | POA: Insufficient documentation

## 2019-07-23 DIAGNOSIS — R600 Localized edema: Secondary | ICD-10-CM | POA: Diagnosis not present

## 2019-07-23 DIAGNOSIS — L0390 Cellulitis, unspecified: Secondary | ICD-10-CM | POA: Diagnosis not present

## 2019-07-23 DIAGNOSIS — Z79899 Other long term (current) drug therapy: Secondary | ICD-10-CM | POA: Insufficient documentation

## 2019-07-23 DIAGNOSIS — I110 Hypertensive heart disease with heart failure: Secondary | ICD-10-CM | POA: Diagnosis not present

## 2019-07-23 DIAGNOSIS — Z7982 Long term (current) use of aspirin: Secondary | ICD-10-CM | POA: Diagnosis not present

## 2019-07-23 DIAGNOSIS — J449 Chronic obstructive pulmonary disease, unspecified: Secondary | ICD-10-CM | POA: Insufficient documentation

## 2019-07-23 DIAGNOSIS — L539 Erythematous condition, unspecified: Secondary | ICD-10-CM | POA: Diagnosis present

## 2019-07-23 DIAGNOSIS — R0902 Hypoxemia: Secondary | ICD-10-CM | POA: Diagnosis not present

## 2019-07-23 LAB — CBC WITH DIFFERENTIAL/PLATELET
Abs Immature Granulocytes: 0.03 10*3/uL (ref 0.00–0.07)
Basophils Absolute: 0 10*3/uL (ref 0.0–0.1)
Basophils Relative: 0 %
Eosinophils Absolute: 0.1 10*3/uL (ref 0.0–0.5)
Eosinophils Relative: 1 %
HCT: 45.7 % (ref 36.0–46.0)
Hemoglobin: 14.8 g/dL (ref 12.0–15.0)
Immature Granulocytes: 0 %
Lymphocytes Relative: 29 %
Lymphs Abs: 2.6 10*3/uL (ref 0.7–4.0)
MCH: 29.7 pg (ref 26.0–34.0)
MCHC: 32.4 g/dL (ref 30.0–36.0)
MCV: 91.8 fL (ref 80.0–100.0)
Monocytes Absolute: 0.7 10*3/uL (ref 0.1–1.0)
Monocytes Relative: 8 %
Neutro Abs: 5.6 10*3/uL (ref 1.7–7.7)
Neutrophils Relative %: 62 %
Platelets: 256 10*3/uL (ref 150–400)
RBC: 4.98 MIL/uL (ref 3.87–5.11)
RDW: 12.1 % (ref 11.5–15.5)
WBC: 9 10*3/uL (ref 4.0–10.5)
nRBC: 0 % (ref 0.0–0.2)

## 2019-07-23 LAB — BASIC METABOLIC PANEL
Anion gap: 10 (ref 5–15)
BUN: 15 mg/dL (ref 8–23)
CO2: 25 mmol/L (ref 22–32)
Calcium: 9.1 mg/dL (ref 8.9–10.3)
Chloride: 103 mmol/L (ref 98–111)
Creatinine, Ser: 0.87 mg/dL (ref 0.44–1.00)
GFR calc Af Amer: >60 mL/min (ref >60)
GFR calc non Af Amer: >60 mL/min (ref >60)
Glucose, Bld: 102 mg/dL — ABNORMAL HIGH (ref 70–99)
Potassium: 4.5 mmol/L (ref 3.5–5.1)
Sodium: 138 mmol/L (ref 135–145)

## 2019-07-23 MED ORDER — VANCOMYCIN HCL 10 G IV SOLR
1500.0000 mg | Freq: Once | INTRAVENOUS | Status: AC
  Administered 2019-07-23: 1500 mg via INTRAVENOUS
  Filled 2019-07-23: qty 1500

## 2019-07-23 MED ORDER — MORPHINE SULFATE (PF) 4 MG/ML IV SOLN
4.0000 mg | Freq: Once | INTRAVENOUS | Status: AC
  Administered 2019-07-23: 4 mg via INTRAVENOUS
  Filled 2019-07-23: qty 1

## 2019-07-23 MED ORDER — CLINDAMYCIN HCL 300 MG PO CAPS: 300.0000 mg | ORAL_CAPSULE | Freq: Three times a day (TID) | ORAL | 0 refills | Status: DC

## 2019-07-23 MED ORDER — VANCOMYCIN HCL IN DEXTROSE 1-5 GM/200ML-% IV SOLN
1000.0000 mg | Freq: Once | INTRAVENOUS | Status: DC
  Filled 2019-07-23: qty 200

## 2019-07-23 MED ORDER — VANCOMYCIN HCL 10 G IV SOLR: 1250.0000 mg | INTRAVENOUS | Status: DC

## 2019-07-23 MED ORDER — SODIUM CHLORIDE 0.9 % IV SOLN: 2.0000 g | Freq: Two times a day (BID) | INTRAVENOUS | Status: DC

## 2019-07-23 MED ORDER — SODIUM CHLORIDE 0.9 % IV SOLN
2.0000 g | Freq: Once | INTRAVENOUS | Status: AC
  Administered 2019-07-23: 2 g via INTRAVENOUS
  Filled 2019-07-23: qty 2

## 2019-07-23 NOTE — Telephone Encounter (Signed)
Forwarding to [name]

## 2019-07-23 NOTE — Telephone Encounter (Signed)
Patient was seen 1 week ago Monday- she was diagnosed with cellulitis. Patient is using antibiotic and antiinflammatory. Doxycycline. Patient was told she needed to be hospitalized when she was diagnosed. Patient is not getting better and the office does not have an appointment to see her before closing- patient advised to go to UC/ED- ED will be choice- she is in assisted living and she prefers to be seen at ED in case they want to keep her for treatment.  Reason for Disposition . [1] Taking antibiotic > 24 hours AND [2] cellulitis symptoms are WORSE (e.g., spreading redness, pain, swelling)  Answer Assessment - Initial Assessment Questions 1. SYMPTOM: "What's the main symptom you're concerned about?" (e.g., redness, swelling, pain, fever, weakness)     Swelling and redness are spreading- lump on leg(2 days) spreading discoloration 2. CELLULITIS LOCATION: "Where is the cellulitis  located?" (e.g., hand, arm, foot, leg, face)     Lower right foot and leg 3. CELLULITIS  SIZE: "What is the size of the red area?" (e.g., inches, centimeters; compare to size of a coin) .     Was one small spot- now several inches long wrapping half around the lag- often hot to touch. Shin and on both sides of leg 4. BETTER-SAME-WORSE: "Are you getting better, staying the same, or getting worse compared to the day you started the antibiotics?"      Not better- seems worse 5.  PAIN: Do you have any pain?"  If so, "How bad is the pain?"  (e.g., Scale 1-10; mild, moderate, or severe)    - MILD (1-3): doesn't interfere with normal activities     - MODERATE (4-7): interferes with normal activities or awakens from sleep    - SEVERE (8-10): excruciating pain, unable to do any normal activities       Throbbing pain- knee down- 8-9 6.  FEVER: "Do you have a fever?" If so, ask: "What is it, how was it measured and when did it start?"     Has not check- does not think so 7. OTHER SYMPTOMS: "Do you have any other symptoms?"  (e.g., pus coming from a wound, red streaks, weakness)     Some weakness- hard to put weight on leg to walk 8.  DIAGNOSIS DATE: "When was the cellulitis diagnosed?" "By whom?"      [facility] [place] [facility] [facility]  ANTIBIOTIC NAME: "What antibiotic(s) are you taking?"  "How many times per day?" (Be sure the patient is receiving the antibiotic as directed).      doxycycline 100 mg- bid 10. ANTIBIOTIC DATE: "When was the antibiotic started?"       7/20 11. FOLLOW-UP APPOINTMENT: "Do you have follow-up appointment with your doctor?"       Patient does have appointment on Monday  Protocols used: CELLULITIS ON ANTIBIOTIC FOLLOW-UP CALL-A-AH

## 2019-07-23 NOTE — Progress Notes (Signed)
Pharmacy Antibiotic Note  KENZLI BARRITT is a 73 y.o. female admitted on 07/23/2019 with a RLE wound infection.  Pharmacy has been consulted for vancomycin and cefepime dosing.  WBC wnl, AF, SCr 0.87 CrCl ~ 55 ml/min  Plan: Vancomycin 1500mg  IV x1, then 1250mg  IV every 12 hours Goal AUC 400-550. Expected AUC: 540 SCr used: 0.87  Cefepime 2g IV every 12 hours Monitor renal function, Cx and clinical progression to narrow Vancomycin levels at steady state  Height: 5\' 4"  (162.6 cm) Weight: 155 lb (70.3 kg) IBW/kg (Calculated) : 54.7  Temp (24hrs), Avg:98.2 F (36.8 C), Min:98.2 F (36.8 C), Max:98.2 F (36.8 C)  No results for input(s): WBC, CREATININE, LATICACIDVEN, VANCOTROUGH, VANCOPEAK, VANCORANDOM, GENTTROUGH, GENTPEAK, GENTRANDOM, TOBRATROUGH, TOBRAPEAK, TOBRARND, AMIKACINPEAK, AMIKACINTROU, AMIKACIN in the last 168 hours.  CrCl cannot be calculated (Patient's most recent lab result is older than the maximum 21 days allowed.).    Allergies  Allergen Reactions  . Abilify [Aripiprazole] Other (See Comments)    parkinsonism  . Penicillins Itching, Swelling and Rash    Tolerated Cefepime in ED. Has patient had a PCN reaction causing immediate rash, facial/tongue/throat swelling, SOB or lightheadedness with hypotension: Yes Has patient had a PCN reaction causing severe rash involving mucus membranes or skin necrosis: No Has patient had a PCN reaction that required hospitalization: No  Has patient had a PCN reaction occurring within the last 10 years: No     Antimicrobials this admission: Vanc 7/31>> Cefepime 7/31>>  Dose adjustments this admission: n/a  Microbiology results: 7/31 BCx: sent  Bertis Ruddy, PharmD Clinical Pharmacist Please check AMION for all [place] numbers 07/23/2019 6:49 PM

## 2019-07-23 NOTE — ED Triage Notes (Signed)
Pt brought via [facility], pt has cellulitis of RLE. Pt has been on atx for the past 6 days and has 1 day of dose left. Pt states the redness on her leg has spread, it's 8/10 throbbing pain. Pt's PCP told her to come to ED for IV atx. PCP did not visually see leg. Pt is A&Ox4. Pt's RLE is erythematous, 1+ edema. Pt does not appear to be in any distress.

## 2019-07-23 NOTE — Telephone Encounter (Signed)
Noted  

## 2019-07-23 NOTE — ED Notes (Signed)
Patient back from  X-ray 

## 2019-07-24 DIAGNOSIS — L03115 Cellulitis of right lower limb: Secondary | ICD-10-CM | POA: Diagnosis not present

## 2019-07-24 NOTE — ED Provider Notes (Signed)
[facility] EMERGENCY DEPARTMENT Provider Note   CSN: [id number] Arrival date & time: 07/23/19  1733     History   Chief Complaint Chief Complaint  Patient presents with  . Leg Pain    HPI [name] is a 73 y.o. female.     HPI 73 year old female with a seven 9 cm region of mild erythema on her anterior right lower leg presents the emergency department reporting no improvement with doxycycline.  She reports this been present for the past 2 weeks.  She is on her last 2 days of doxycycline and reports ongoing pain and erythema.  She does not believe it is improving.  She has no other systemic symptoms.  No fevers or chills.  Denies nausea vomiting.  No abdominal pain.  No known injury or trauma to her leg.  She contacted her primary care physician today regarding the lack of improvement with doxycycline and it was recommended she come to the ER for further evaluation and possible IV abx.  Symptoms are mild to moderate in severity.  Worse with palpation.  No drainage.  No other complaints.   Past Medical History:  Diagnosis Date  . Acute upper GI bleed 06/2014   while pt taking coumadin, plavix, and meloxicam---despite being told not to take coumadin.  . Anginal pain ([place])    Nonobstructive CAD 2014; however, her cardiologist put her on a statin for this and NOT for hyperlipidemia per pt report.  Atyp CP 08/2017 at card f/u, plan for myoc perf imaging.  . Anxiety    panic attacks  . Asthma    w/ asbestososis   . BPPV (benign paroxysmal positional vertigo) 12/16/2012  . Chronic diastolic CHF (congestive heart failure) (HCC)    dry wt as of 11/06/16 is 168 lbs.  . Chronic lower back pain   . COPD (chronic obstructive pulmonary disease) ([place])   . DDD (degenerative disc disease)    lumbar and cervical.   . Diverticular disease   . Fibromyalgia    Patient states dx was around her late 52s but she had sx's for years prior to this.  . H/O hiatal hernia   .  Hay fever   . History of pneumonia    hospitalized 12/2011, 02/2013, and 07/2013 [facility]) for this  . HTN (hypertension)    Renal artery dopplers 04/2013 neg for stenosis.  . Idiopathic angio-edema-urticaria 72014   Angioedema component was very minimal  . Insomnia   . Iron deficiency anemia    Hematologist in [place], [place] did extensive w/u; no cause found; failed oral supplement;; gets fairly regular (q48m or so) IV iron infusions (Venofer -iron sucrose- 200mg  with procrit.  "for 14 yr I've been getting blood work q month & getting infusions prn" (07/12/2013).  Dr. Marin Olp locally, iron infusions done, EPO deficiency dx'd  . Migraine syndrome    "not as often anymore; used to be ~ q wk" (07/12/2013)  . Mixed incontinence urge and stress   . Nephrolithiasis    "passed all on my own or they are still in there" (07/12/2013)  . Neuroleptic induced Parkinsonism ([place]) 2018   Dr. Carles Collet, neuro, saw her 11/24/17 and recommended d/c of abilify as first step.  D/c'd abilify and pt got complete recovery.  . OSA on CPAP    prior to move to [place]--had another sleep study 10/2015 w/pulm Dr. Camillo Flaming.  . Osteoarthritis    "severe; progressing fast" (07/12/2013); multiple joints-not surgical candidate for TKR (03/2015).  Triamcinolon knee injections by Dr. Tessa Lerner 12/2017.  [name] Kitchen Pernicious anemia 08/24/2014  . Pleural plaque with presence of asbestos 07/22/2013  . Pulmonary embolism ([place]) 07/2013   Dx at [facility] with very small peripheral upper lobe pe 07/2013: pt took coumadin for about 8-9 mo  . Pyelonephritis    "several times over the last 30 yr" (07/12/2013)  . RBBB (right bundle branch block)   . Recurrent major depression ([place])   . Recurrent UTI    hx of hospitalization for pyelonephritis; started abx prophylaxis 06/2015  . Syncope    Hypotensive; ED visit--Dr. Terrence Dupont did Cath--nonobstructive CAD, EF 55-60%.  In retrospect, suspect pt rx med misuse/polypharmacy  . Tension headache, chronic     Patient Active Problem List    Diagnosis Date Noted  . Asthma 01/21/2018  . COPD (chronic obstructive pulmonary disease) ([place]) 01/21/2018  . Fibromyalgia 01/21/2018  . Pulmonary asbestosis ([place]) 01/21/2018  . Influenza A 01/02/2018  . Chronic respiratory failure with hypoxia ([place]) 01/02/2018  . Hypomagnesemia 01/02/2018  . Acute bronchitis 01/02/2018  . Failure to thrive in adult 01/01/2018  . Altered mental status 10/21/2017  . Fall 10/21/2017  . Lumbar radiculitis 02/10/2017  . Dehydration 12/04/2016  . Low serum erythropoietin level 10/17/2016  . RBBB 09/23/2016  . Primary osteoarthritis of left knee 06/19/2016  . Debilitated patient 06/06/2016  . Acute on chronic diastolic CHF (congestive heart failure), NYHA class 3 ([place]) 05/30/2016  . SOB (shortness of breath)   . [name] ([place])   . Restrictive lung disease 02/07/2016  . Rotator cuff syndrome of right shoulder 01/10/2016  . UTI (urinary tract infection) 01/01/2016  . Encephalopathy, metabolic 37/85/8850  . CAP (community acquired pneumonia) 01/01/2016  . Fall at home 01/01/2016  . Rhabdomyolysis 01/01/2016  . Diastolic heart failure ([place]) 10/16/2015  . CAD-minor 2014 08/16/2015  . Chest pain with moderate risk for cardiac etiology 08/16/2015  . Narrowing of intervertebral disc space 07/17/2015  . Bilateral lower leg pain 01/24/2015  . 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 Chronic pain syndrome 11/21/2014  . Anxiety and depression 11/21/2014  . Abnormal grief reaction 11/21/2014  . Severe persistent asthma 11/21/2014  . Hypokalemia 11/21/2014  . Hyperlipidemia 11/21/2014  . Primary osteoarthritis of right knee 10/18/2014  . Pernicious anemia 08/24/2014  . Generalized anxiety disorder--with occasional panic attacks.  08/05/2014  . Recurrent major depression-severe ([place]) 08/05/2014  . Multifactorial gait disorder 07/26/2014  . Epistaxis 07/18/2014  . Acute GI bleeding 07/17/2014  . Anemia associated with acute blood loss 07/17/2014  .  Syncope 07/17/2014  . History of pulmonary embolism 07/17/2014  . GI bleed 07/17/2014  . Arthritis 05/11/2014  . DDD (degenerative disc disease) 05/11/2014  . Fibrositis 05/11/2014  . Amianthosis ([place]) 05/11/2014  . Grief 04/28/2014  . OSA (obstructive sleep apnea) 04/24/2014  . Chronic diastolic CHF, NYHA class 1 04/24/2014  . Acute pulmonary embolism ([place]) 08/13/2013  . Pulmonary embolism ([place]) 08/04/2013  . Pleural plaque with presence of asbestos 07/22/2013  . Polypharmacy 04/26/2013  . Fibromyalgia syndrome 03/01/2013  . Insomnia 11/12/2012  . HTN (hypertension), benign 10/25/2012    Past Surgical History:  Procedure Laterality Date  . APPENDECTOMY  1960  . AXILLARY SURGERY Left 1978   Multiple "lump" in armpit per pt  . CARDIAC CATHETERIZATION  01/2013   nonobstructive CAD, EF 55-60%  . CARDIOVASCULAR STRESS TEST  02/22/15   Low risk myocard perf imaging; wall motion normal, normal EF  . carotid duplex doppler  10/21/2017  R vertebral flow suggestive of possible distal obstruction.  Pt declined further w/u as of 10/29/17 but need to revisit this problem periodically.  . COCCYX REMOVAL  1972  . DEXA  06/05/2017   T-score -3.1  . DILATION AND CURETTAGE OF UTERUS  ? 1970's  . ESOPHAGOGASTRODUODENOSCOPY N/A 07/19/2014   Gastritis found + in the setting of supratherapeutic INR, +plavix, + meloxicam.  . EYE SURGERY Left 2012-2013   "injections for ~ 1 yr; don't really know what for" (07/12/2013)  . HEEL SPUR SURGERY Left 2008  . KNEE SURGERY  2005  . LEFT HEART CATHETERIZATION WITH CORONARY ANGIOGRAM N/A 01/30/2013   Procedure: LEFT HEART CATHETERIZATION WITH CORONARY ANGIOGRAM;  Surgeon: Clent Demark, MD;  Location: [facility] CATH LAB;  Service: Cardiovascular;  Laterality: N/A;  . PLANTAR FASCIA RELEASE Left 2008  . SPIROMETRY  04/25/14   In hosp for acute asthma/COPD flare: mixed obstructive and restrictive lung disease. The FEV1 is severely reduced at 45% predicted.  FEV1 signif  decreased compared to prior spirometry 07/23/13.  . TENDON RELEASE  1996   Right forearm and hand  . TOTAL ABDOMINAL HYSTERECTOMY  1974  . TRANSTHORACIC ECHOCARDIOGRAM  01/2013; 04/2014;08/2015; 09/2017   2014--NORMAL.  2015--focal basal septal hypertrophy, EF 55-60%, grade I diast dysfxn, mild LAE.  08/2015 EF 55-60%, nl LV syst fxn, grade I DD, valves wnl. 10/21/17: EF 65-70%, grd I DD, o/w normal.     OB History   No obstetric history on file.      Home Medications    Prior to Admission medications   Medication Sig Start Date End Date Taking? Authorizing Provider  alendronate (FOSAMAX) 70 MG tablet Take 1 tablet (70 mg total) by mouth every 7 (seven) days. Take with a full glass of water on an empty stomach, first thing in the morning and remain up right for 30 minutes after taking. 06/05/17   McGowen, Adrian Blackwater, MD  ALPRAZolam Duanne Moron) 1 MG tablet TAKE 1 TABLET THREE TIMES DAILY AS NEEDED FOR ANXIETY. 04/05/19   McGowen, Adrian Blackwater, MD  aspirin 81 MG tablet Take 81 mg by mouth at bedtime.    [provider]  clindamycin (CLEOCIN) 300 MG capsule Take 1 capsule (300 mg total) by mouth 3 (three) times daily. 07/23/19   Jola Schmidt, MD  diclofenac sodium (VOLTAREN) 1 % GEL Apply 2 g topically 3 (three) times daily as needed (pain). 11/19/16   Bayard Hugger, NP  DULoxetine (CYMBALTA) 30 MG capsule TAKE 1 CAPSULE ONCE DAILY ALONG WITH 60MG  CAPSULE FOR TOTAL 90MG  DAILY. 07/15/19   McGowen, Adrian Blackwater, MD  DULoxetine (CYMBALTA) 60 MG capsule TAKE 1 CAPSULE A DAY TO BE COMBINED WITH 30MG  CAPSULE 01/21/19   McGowen, Adrian Blackwater, MD  furosemide (LASIX) 40 MG tablet Take 1 tablet (40 mg total) by mouth daily. Patient taking differently: Take 40 mg by mouth as needed for fluid.  12/06/16   Dhungel, Flonnie Overman, MD  isosorbide mononitrate (IMDUR) 30 MG 24 hr tablet TAKE 2 TABLETS IN THE AM AND 1 TABLET IN THE PM. 04/28/19   Lelon Perla, MD  lamoTRIgine (LAMICTAL) 100 MG tablet TAKE 1 TABLET EACH DAY.  02/05/19   McGowen, Adrian Blackwater, MD  metoprolol succinate (TOPROL-XL) 50 MG 24 hr tablet 1 tab po qAM, then take another 1/2-1 tab if bp is >160/100. 09/04/18   McGowen, Adrian Blackwater, MD  metroNIDAZOLE (METROGEL) 0.75 % gel Apply 1 application topically 2 (two) times daily. 08/07/18   Shawnie Dapper  H, MD  Multiple Vitamin (MULTIVITAMIN WITH MINERALS) TABS tablet Take 1 tablet by mouth daily.    [provider]  nitroGLYCERIN (NITROSTAT) 0.4 MG SL tablet Place 1 tablet (0.4 mg total) under the tongue every 5 (five) minutes as needed for chest pain (x 3 doses). 06/02/17   Barrett, Evelene Croon, PA-C  oxyCODONE-acetaminophen (PERCOCET) 10-325 MG tablet Take 1 tablet by mouth every 6 (six) hours as needed for pain. 07/14/19   Meredith Staggers, MD  pantoprazole (PROTONIX) 40 MG tablet TAKE 1 TABLET BY MOUTH DAILY. 03/10/19   McGowen, Adrian Blackwater, MD  traZODone (DESYREL) 50 MG tablet TAKE 2-4 TABLETS AT BEDTIME 04/29/19   McGowen, Adrian Blackwater, MD  Vitamin D, Ergocalciferol, (DRISDOL) 1.25 MG (50000 UT) CAPS capsule TAKE (1) CAPSULE TWICE A WEEK. 04/29/19   McGowen, Adrian Blackwater, MD    Family History Family History  Problem Relation Age of Onset  . Arthritis Mother   . Kidney disease Mother   . Heart disease Father   . Stroke Father   . Hypertension Father   . Diabetes Father   . Heart attack Father   . Heart attack Paternal Grandmother   . Diabetes Sister        one sister  . Hypertension Sister   . Hypertension Brother   . Multiple sclerosis Son     Social History Social History   Tobacco Use  . Smoking status: Never Smoker  . Smokeless tobacco: Never Used  . Tobacco comment: never used tobacco  Substance Use Topics  . Alcohol use: No    Alcohol/week: 0.0 standard drinks  . Drug use: No     Allergies   Abilify [aripiprazole] and Penicillins   Review of Systems Review of Systems  All other systems reviewed and are negative.    Physical Exam Updated Vital Signs BP (!) 163/74 (BP  Location: Right Arm)   Pulse 64   Temp 98.2 F (36.8 C) (Oral)   Resp 18   Ht 5\' 4"  (1.626 m)   Wt 70.3 kg   SpO2 95%   BMI 26.61 kg/m   Physical Exam Vitals signs and nursing note reviewed.  Constitutional:      Appearance: She is well-developed.  HENT:     Head: Normocephalic.  Neck:     Musculoskeletal: Normal range of motion.  Pulmonary:     Effort: Pulmonary effort is normal.  Abdominal:     General: There is no distension.  Musculoskeletal: Normal range of motion.     Comments: Distal one third of the anterior tibia with overlying erythema without frank fluctuance.  No obvious abscess.  Mild warmth and tenderness.  Remainder of right leg is normal as compared to left.  Normal PT and DP pulse in the right foot.  Neurological:     Mental Status: She is alert and oriented to person, place, and time.      ED Treatments / Results  Labs (all labs ordered are listed, but only abnormal results are displayed) Labs Reviewed  BASIC METABOLIC PANEL - Abnormal; Notable for the following components:      Result Value   Glucose, Bld 102 (*)    All other components within normal limits  CULTURE, BLOOD (ROUTINE X 2)  CULTURE, BLOOD (ROUTINE X 2)  CBC WITH DIFFERENTIAL/PLATELET    EKG None  Radiology Dg Tibia/fibula Right  Result Date: 07/23/2019 CLINICAL DATA:  Infection swelling erythema EXAM: RIGHT TIBIA AND FIBULA - 2 VIEW COMPARISON:  None.  FINDINGS: No fracture or dislocation. There is diffuse osteopenia. Overlying pretibial soft tissue swelling is seen. No large joint effusion. Osteoarthritis seen at the knee. IMPRESSION: No acute osseous findings.  Overlying soft tissue edema. Electronically Signed   By: Prudencio Pair M.D.   On: 07/23/2019 19:48    Procedures Procedures (including critical care time)  Medications Ordered in ED Medications  ceFEPIme (MAXIPIME) 2 g in sodium chloride 0.9 % 100 mL IVPB (0 g Intravenous Stopped 07/23/19 2155)  vancomycin (VANCOCIN)  1,500 mg in sodium chloride 0.9 % 500 mL IVPB (0 mg Intravenous Stopped 07/24/19 0034)  morphine 4 MG/ML injection 4 mg (4 mg Intravenous Given 07/23/19 2119)     Initial Impression / Assessment and Plan / ED Course  I have reviewed the triage vital signs and the nursing notes.  Pertinent labs & imaging results that were available during my care of the patient were reviewed by me and considered in my medical decision making (see chart for details).        Treated with IV antibiotics here in the emergency department.  Erythema completely resolved here in the emergency department with plain film demonstrating no evidence of osteomyelitis or air in the soft tissue.  Initial plan was for MRI to evaluate for deep space infection but with complete resolution of erythema with single dose of IV antibiotics this is unlikely.  I will discharge the patient home with clindamycin and primary care follow-up.  I do not think she needs additional advanced imaging through the emergency department this time.  MRI could be warranted as an outpatient if her erythema returns or pain continues despite change in her course of antibiotics.  Home with clindamycin.  Vital signs are stable.  White blood cell count is normal.  Patient is agreeable to outpatient plan she understands return to the ER for new or worsening symptoms  Final Clinical Impressions(s) / ED Diagnoses   Final diagnoses:  Cellulitis of right lower extremity    ED Discharge Orders         Ordered    clindamycin (CLEOCIN) 300 MG capsule  3 times daily     07/23/19 2342           Jola Schmidt, MD 07/24/19 2324

## 2019-07-26 ENCOUNTER — Other Ambulatory Visit: Payer: Self-pay | Admitting: Family Medicine

## 2019-07-26 ENCOUNTER — Encounter: Payer: Self-pay | Admitting: Family Medicine

## 2019-07-26 ENCOUNTER — Other Ambulatory Visit: Payer: Self-pay

## 2019-07-26 ENCOUNTER — Ambulatory Visit (INDEPENDENT_AMBULATORY_CARE_PROVIDER_SITE_OTHER): Payer: Medicare Other | Admitting: Family Medicine

## 2019-07-26 VITALS — BP 118/73 | HR 76 | Temp 97.2°F | Resp 16 | Ht 64.0 in | Wt 155.0 lb

## 2019-07-26 DIAGNOSIS — L03115 Cellulitis of right lower limb: Secondary | ICD-10-CM

## 2019-07-26 DIAGNOSIS — R413 Other amnesia: Secondary | ICD-10-CM | POA: Diagnosis not present

## 2019-07-26 MED ORDER — DONEPEZIL HCL 5 MG PO TABS: ORAL_TABLET | ORAL | 1 refills | Status: DC

## 2019-07-26 MED ORDER — FLUCONAZOLE 100 MG PO TABS: ORAL_TABLET | ORAL | 1 refills | Status: DC

## 2019-07-26 MED ORDER — ONDANSETRON HCL 8 MG PO TABS: 8.0000 mg | ORAL_TABLET | Freq: Three times a day (TID) | ORAL | 1 refills | Status: DC | PRN

## 2019-07-26 MED ORDER — ALENDRONATE SODIUM 70 MG PO TABS: 70.0000 mg | ORAL_TABLET | ORAL | 3 refills | Status: DC

## 2019-07-26 NOTE — Progress Notes (Signed)
OFFICE VISIT  07/26/2019   CC:  Chief Complaint  Patient presents with  . Leg Pain    Right lower   HPI:    Patient is a 73 y.o. Caucasian female who presents for right lower leg erythema and pain, was put on doxy early last week by an outpt provider, then went to ED for eval of this problem on 07/23/19 b/c was not improving. In the ED she got an x-ray of R tib/fib which showed overlying soft tissue edema but no acute osseous findings. She was given vancomycin and cefipime IV in ED and her erythema completely resolved.  She was d/c'd home on clindamycin.  Interim hx: Pain is lessened, "tolerable now". Redness is much better.  Taking clindamycin as rx'd. Occ nausea, which is a common symptom for her chronically. No vomiting.  Asks for rx for zofran b/c this has helped her regularly in the past. No fevers or malaise.  She also wanted to talk about some memory problems.  Describes some short term memory problems she has noted over the last couple of years that came on insidiously and has gradually progressed.  Describes some trouble with names of people she knows, taking a while to recall it sometimes.  Can't recollect in detail what she recently read in a book or a movie she watched.  No impairment in ADLs, driving, etc.  Doing fine in relationships with family, friends. Seems happy living in her retirement home.  Long hx of depression but says she has felt stable from this standpoint lately.  Past Medical History:  Diagnosis Date  . Acute upper GI bleed 06/2014   while pt taking coumadin, plavix, and meloxicam---despite being told not to take coumadin.  . Anginal pain ([place])    Nonobstructive CAD 2014; however, her cardiologist put her on a statin for this and NOT for hyperlipidemia per pt report.  Atyp CP 08/2017 at card f/u, plan for myoc perf imaging.  . Anxiety    panic attacks  . Asthma    w/ asbestososis   . BPPV (benign paroxysmal positional vertigo) 12/16/2012  . Chronic  diastolic CHF (congestive heart failure) (HCC)    dry wt as of 11/06/16 is 168 lbs.  . Chronic lower back pain   . COPD (chronic obstructive pulmonary disease) ([place])   . DDD (degenerative disc disease)    lumbar and cervical.   . Diverticular disease   . Fibromyalgia    Patient states dx was around her late 43s but she had sx's for years prior to this.  . H/O hiatal hernia   . Hay fever   . History of pneumonia    hospitalized 12/2011, 02/2013, and 07/2013 [facility]) for this  . HTN (hypertension)    Renal artery dopplers 04/2013 neg for stenosis.  . Idiopathic angio-edema-urticaria 72014   Angioedema component was very minimal  . Insomnia   . Iron deficiency anemia    Hematologist in [place], [place] did extensive w/u; no cause found; failed oral supplement;; gets fairly regular (q69m or so) IV iron infusions (Venofer -iron sucrose- 200mg  with procrit.  "for 14 yr I've been getting blood work q month & getting infusions prn" (07/12/2013).  Dr. Marin Olp locally, iron infusions done, EPO deficiency dx'd  . Migraine syndrome    "not as often anymore; used to be ~ q wk" (07/12/2013)  . Mixed incontinence urge and stress   . Nephrolithiasis    "passed all on my own or they are still in there" (  07/12/2013)  . Neuroleptic induced Parkinsonism ([place]) 2018   Dr. Carles Collet, neuro, saw her 11/24/17 and recommended d/c of abilify as first step.  D/c'd abilify and pt got complete recovery.  . OSA on CPAP    prior to move to [place]--had another sleep study 10/2015 w/pulm Dr. Camillo Flaming.  . Osteoarthritis    "severe; progressing fast" (07/12/2013); multiple joints-not surgical candidate for TKR (03/2015).  Triamcinolon knee injections by Dr. Tessa Lerner 12/2017.  [name] Kitchen Pernicious anemia 08/24/2014  . Pleural plaque with presence of asbestos 07/22/2013  . Pulmonary embolism ([place]) 07/2013   Dx at [facility] with very small peripheral upper lobe pe 07/2013: pt took coumadin for about 8-9 mo  . Pyelonephritis    "several times over the last 30 yr"  (07/12/2013)  . RBBB (right bundle branch block)   . Recurrent major depression ([place])   . Recurrent UTI    hx of hospitalization for pyelonephritis; started abx prophylaxis 06/2015  . Syncope    Hypotensive; ED visit--Dr. Terrence Dupont did Cath--nonobstructive CAD, EF 55-60%.  In retrospect, suspect pt rx med misuse/polypharmacy  . Tension headache, chronic     Past Surgical History:  Procedure Laterality Date  . APPENDECTOMY  1960  . AXILLARY SURGERY Left 1978   Multiple "lump" in armpit per pt  . CARDIAC CATHETERIZATION  01/2013   nonobstructive CAD, EF 55-60%  . CARDIOVASCULAR STRESS TEST  02/22/15   Low risk myocard perf imaging; wall motion normal, normal EF  . carotid duplex doppler  10/21/2017   R vertebral flow suggestive of possible distal obstruction.  Pt declined further w/u as of 10/29/17 but need to revisit this problem periodically.  . COCCYX REMOVAL  1972  . DEXA  06/05/2017   T-score -3.1  . DILATION AND CURETTAGE OF UTERUS  ? 1970's  . ESOPHAGOGASTRODUODENOSCOPY N/A 07/19/2014   Gastritis found + in the setting of supratherapeutic INR, +plavix, + meloxicam.  . EYE SURGERY Left 2012-2013   "injections for ~ 1 yr; don't really know what for" (07/12/2013)  . HEEL SPUR SURGERY Left 2008  . KNEE SURGERY  2005  . LEFT HEART CATHETERIZATION WITH CORONARY ANGIOGRAM N/A 01/30/2013   Procedure: LEFT HEART CATHETERIZATION WITH CORONARY ANGIOGRAM;  Surgeon: Clent Demark, MD;  Location: [facility] CATH LAB;  Service: Cardiovascular;  Laterality: N/A;  . PLANTAR FASCIA RELEASE Left 2008  . SPIROMETRY  04/25/14   In hosp for acute asthma/COPD flare: mixed obstructive and restrictive lung disease. The FEV1 is severely reduced at 45% predicted.  FEV1 signif decreased compared to prior spirometry 07/23/13.  . TENDON RELEASE  1996   Right forearm and hand  . TOTAL ABDOMINAL HYSTERECTOMY  1974  . TRANSTHORACIC ECHOCARDIOGRAM  01/2013; 04/2014;08/2015; 09/2017   2014--NORMAL.  2015--focal basal septal  hypertrophy, EF 55-60%, grade I diast dysfxn, mild LAE.  08/2015 EF 55-60%, nl LV syst fxn, grade I DD, valves wnl. 10/21/17: EF 65-70%, grd I DD, o/w normal.    Outpatient Medications Prior to Visit  Medication Sig Dispense Refill  . ALPRAZolam (XANAX) 1 MG tablet TAKE 1 TABLET THREE TIMES DAILY AS NEEDED FOR ANXIETY. 90 tablet 5  . aspirin 81 MG tablet Take 81 mg by mouth at bedtime.    . clindamycin (CLEOCIN) 300 MG capsule Take 1 capsule (300 mg total) by mouth 3 (three) times daily. 21 capsule 0  . diclofenac sodium (VOLTAREN) 1 % GEL Apply 2 g topically 3 (three) times daily as needed (pain). 2 Tube 2  . DULoxetine (CYMBALTA)  30 MG capsule TAKE 1 CAPSULE ONCE DAILY ALONG WITH 60MG  CAPSULE FOR TOTAL 90MG  DAILY. 90 capsule 1  . DULoxetine (CYMBALTA) 60 MG capsule TAKE 1 CAPSULE A DAY TO BE COMBINED WITH 30MG  CAPSULE 90 capsule 1  . isosorbide mononitrate (IMDUR) 30 MG 24 hr tablet TAKE 2 TABLETS IN THE AM AND 1 TABLET IN THE PM. 90 tablet 3  . lamoTRIgine (LAMICTAL) 100 MG tablet TAKE 1 TABLET EACH DAY. 90 tablet 1  . metoprolol succinate (TOPROL-XL) 50 MG 24 hr tablet 1 tab po qAM, then take another 1/2-1 tab if bp is >160/100. 90 tablet 3  . Multiple Vitamin (MULTIVITAMIN WITH MINERALS) TABS tablet Take 1 tablet by mouth daily.    [name] Kitchen oxyCODONE-acetaminophen (PERCOCET) 10-325 MG tablet Take 1 tablet by mouth every 6 (six) hours as needed for pain. 100 tablet 0  . pantoprazole (PROTONIX) 40 MG tablet TAKE 1 TABLET BY MOUTH DAILY. 90 tablet 1  . traZODone (DESYREL) 50 MG tablet TAKE 2-4 TABLETS AT BEDTIME 120 tablet 5  . Vitamin D, Ergocalciferol, (DRISDOL) 1.25 MG (50000 UT) CAPS capsule TAKE (1) CAPSULE TWICE A WEEK. 24 capsule 0  . alendronate (FOSAMAX) 70 MG tablet Take 1 tablet (70 mg total) by mouth every 7 (seven) days. Take with a full glass of water on an empty stomach, first thing in the morning and remain up right for 30 minutes after taking. 12 tablet 3  . furosemide (LASIX) 40 MG  tablet Take 1 tablet (40 mg total) by mouth daily. (Patient not taking: Reported on 07/26/2019) 60 tablet 11  . metroNIDAZOLE (METROGEL) 0.75 % gel Apply 1 application topically 2 (two) times daily. (Patient not taking: Reported on 07/26/2019) 45 g 3  . nitroGLYCERIN (NITROSTAT) 0.4 MG SL tablet Place 1 tablet (0.4 mg total) under the tongue every 5 (five) minutes as needed for chest pain (x 3 doses). (Patient not taking: Reported on 07/26/2019) 25 tablet 3   Facility-Administered Medications Prior to Visit  Medication Dose Route Frequency Provider Last Rate Last Dose  . ferumoxytol (FERAHEME) 510 mg in sodium chloride 0.9 % 100 mL IVPB  510 mg Intravenous Once Volanda Napoleon, MD        Allergies  Allergen Reactions  . Abilify [Aripiprazole] Other (See Comments)    parkinsonism  . Penicillins Itching, Swelling and Rash    Tolerated Cefepime in ED. Has patient had a PCN reaction causing immediate rash, facial/tongue/throat swelling, SOB or lightheadedness with hypotension: Yes Has patient had a PCN reaction causing severe rash involving mucus membranes or skin necrosis: No Has patient had a PCN reaction that required hospitalization: No  Has patient had a PCN reaction occurring within the last 10 years: No     ROS As per HPI  PE: Blood pressure 118/73, pulse 76, temperature (!) 97.2 F (36.2 C), temperature source Temporal, resp. rate 16, height 5\' 4"  (1.626 m), weight 155 lb (70.3 kg), SpO2 96 %. Gen: Alert, well appearing.  Patient is oriented to person, place, time, and situation. AFFECT: pleasant, lucid thought and speech. R lower leg without erythema, swelling, or focal lesion.  No warmth.  Mod TTP diffusely in pretibial region of R LL.  LABS:  Lab Results  Component Value Date   WBC 9.0 07/23/2019   HGB 14.8 07/23/2019   HCT 45.7 07/23/2019   MCV 91.8 07/23/2019   PLT 256 07/23/2019     Chemistry      Component Value Date/Time   NA 138 07/23/2019  1926   NA 141 06/05/2015  1315   K 4.5 07/23/2019 1926   K 3.9 06/05/2015 1315   CL 103 07/23/2019 1926   CL 103 06/05/2015 1315   CO2 25 07/23/2019 1926   CO2 28 06/05/2015 1315   BUN 15 07/23/2019 1926   BUN 14 06/05/2015 1315   CREATININE 0.87 07/23/2019 1926   CREATININE 0.87 02/03/2017 1523      Component Value Date/Time   CALCIUM 9.1 07/23/2019 1926   CALCIUM 9.0 06/05/2015 1315   ALKPHOS 58 01/01/2018 1052   ALKPHOS 84 06/05/2015 1315   AST 29 01/01/2018 1052   AST 23 06/05/2015 1315   ALT 16 01/01/2018 1052   ALT 17 06/05/2015 1315   BILITOT 1.2 01/01/2018 1052   BILITOT 1.00 06/05/2015 1315       IMPRESSION AND PLAN:  1) Right lower leg cellulitis--improved significantly with IV abx in ED. Continues to do well on clinda. Finish 7d course of clinda. RF zofran.  2) Memory impairment/mild cognitive impairment. NO impairment of functioning.  No workup indicated at this time. Discussed med trial -->pt very interested in this b/c a friend of hers has recently developed alzheimer's dz, plus pt has been seeing commercials talking about how great some of the newer dementia meds are. We discussed the nature of meds for this condition (primarlily the fact that the meds DO NOT slow/halt the progression of dementia.  Also we discussed side effect profile of med. She expressed understanding and wanted to proceed with trial. Start trial of aricept 5mg  qd for memory impairment.  An After Visit Summary was printed and given to the patient.  FOLLOW UP: Return in about 2 months (around 09/25/2019) for routine chronic illness f/u.  Signed:  Crissie Sickles, MD           07/26/2019

## 2019-07-28 LAB — CULTURE, BLOOD (ROUTINE X 2)
Culture: NO GROWTH
Culture: NO GROWTH
Special Requests: ADEQUATE
Special Requests: ADEQUATE

## 2019-08-06 ENCOUNTER — Encounter: Payer: Medicare Other | Attending: Physical Medicine & Rehabilitation | Admitting: Registered Nurse

## 2019-08-06 ENCOUNTER — Other Ambulatory Visit: Payer: Self-pay

## 2019-08-06 VITALS — BP 110/69 | HR 82 | Temp 98.2°F | Resp 14 | Ht 64.0 in | Wt 153.0 lb

## 2019-08-06 DIAGNOSIS — M545 Low back pain, unspecified: Secondary | ICD-10-CM

## 2019-08-06 DIAGNOSIS — M1711 Unilateral primary osteoarthritis, right knee: Secondary | ICD-10-CM | POA: Diagnosis not present

## 2019-08-06 DIAGNOSIS — M1712 Unilateral primary osteoarthritis, left knee: Secondary | ICD-10-CM | POA: Diagnosis not present

## 2019-08-06 DIAGNOSIS — G8929 Other chronic pain: Secondary | ICD-10-CM | POA: Diagnosis not present

## 2019-08-06 DIAGNOSIS — R2689 Other abnormalities of gait and mobility: Secondary | ICD-10-CM | POA: Diagnosis not present

## 2019-08-06 DIAGNOSIS — M797 Fibromyalgia: Secondary | ICD-10-CM | POA: Diagnosis not present

## 2019-08-06 DIAGNOSIS — G894 Chronic pain syndrome: Secondary | ICD-10-CM

## 2019-08-06 DIAGNOSIS — Z5181 Encounter for therapeutic drug level monitoring: Secondary | ICD-10-CM | POA: Diagnosis not present

## 2019-08-06 DIAGNOSIS — Z79899 Other long term (current) drug therapy: Secondary | ICD-10-CM

## 2019-08-06 MED ORDER — OXYCODONE-ACETAMINOPHEN 10-325 MG PO TABS: 1.0000 | ORAL_TABLET | Freq: Four times a day (QID) | ORAL | 0 refills | Status: DC | PRN

## 2019-08-06 NOTE — Progress Notes (Signed)
Subjective:    Patient ID: Linda [name], female    DOB: 05-21-1946, 73 y.o.   MRN: [id number]  HPI: Linda [name] is a 73 y.o. female who returns for follow up appointment for chronic pain and medication refill. She states her pain is located in her lower back radiating into her right lower extremity and bilateral knee pain. She rates her pain 7. Her current exercise regime is walking.  Linda [name] Morphine equivalent is 60.00 MME. She is also prescribed Alprazolam  by Dr. Anitra [name]. .We have discussed the black box warning of using opioids and benzodiazepines. I highlighted the dangers of using these drugs together and discussed the adverse events including respiratory suppression, overdose, cognitive impairment and importance of compliance with current regimen. We will continue to monitor and adjust as indicated.   Last UDS was Performed on 07/14/2019, it was consistent.   Pain Inventory Average Pain 7 Pain Right Now 7 My pain is sharp, stabbing, aching and throbbing  In the last 24 hours, has pain interfered with the following? General activity 10 Relation with others 9 Enjoyment of life 9 What TIME of day is your pain at its worst? all Sleep (in general) Poor  Pain is worse with: walking, bending, standing and some activites Pain improves with: rest, heat/ice, medication and injections Relief from Meds: 5  Mobility walk with assistance use a cane use a walker ability to climb steps?  no do you drive?  yes  Function retired I need assistance with the following:  bathing, meal prep, household duties and shopping  Neuro/Psych bladder control problems trouble walking anxiety  Prior Studies Any changes since last visit?  no  Physicians involved in your care Any changes since last visit?  no   Family History  Problem Relation Age of Onset  . Arthritis Mother   . Kidney disease Mother   . Heart disease Father   . Stroke Father   . Hypertension Father   .  Diabetes Father   . Heart attack Father   . Heart attack Paternal Grandmother   . Diabetes Sister        one sister  . Hypertension Sister   . Hypertension Brother   . Multiple sclerosis Son    Social History   Socioeconomic History  . Marital status: Widowed    Spouse name: Not on file  . Number of children: 2  . Years of education: Not on file  . Highest education level: Not on file  Occupational History  . 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 Financial resource strain: Not on file  . Food insecurity    Worry: Not on file    Inability: Not on file  . Transportation needs    Medical: Not on file    Non-medical: Not on file  Tobacco Use  . Smoking status: Never Smoker  . Smokeless tobacco: Never Used  . Tobacco comment: never used tobacco  Substance and Sexual Activity  . Alcohol use: No    Alcohol/week: 0.0 standard drinks  . Drug use: No  . Sexual activity: Not Currently  Lifestyle  . Physical activity    Days per week: Not on file    Minutes per session: Not on file  . Stress: Not on file  Relationships  . Social Herbalist on phone: Not on file    Gets together: Not on file    Attends religious service:  Not on file    Active member of club or organization: Not on file    Attends meetings of clubs or organizations: Not on file    Relationship status: Not on file  Other Topics Concern  . Not on file  Social History Narrative   Widowed, 2 sons.  Relocated to [place] 09/2012 to be closer to her son who has MS.   Husband d 2015--mesothelioma.   Occupation: former Pharmacist, hospital.   Education: masters degree level.   No T/A/Ds.   Lives in [place], independent living.   Past Surgical History:  Procedure Laterality Date  . APPENDECTOMY  1960  . AXILLARY SURGERY Left 1978   Multiple "lump" in armpit per pt  . CARDIAC CATHETERIZATION  01/2013   nonobstructive CAD, EF 55-60%  . CARDIOVASCULAR STRESS TEST  02/22/15   Low  risk myocard perf imaging; wall motion normal, normal EF  . carotid duplex doppler  10/21/2017   R vertebral flow suggestive of possible distal obstruction.  Pt declined further w/u as of 10/29/17 but need to revisit this problem periodically.  . COCCYX REMOVAL  1972  . DEXA  06/05/2017   T-score -3.1  . DILATION AND CURETTAGE OF UTERUS  ? 1970's  . ESOPHAGOGASTRODUODENOSCOPY N/A 07/19/2014   Gastritis found + in the setting of supratherapeutic INR, +plavix, + meloxicam.  . EYE SURGERY Left 2012-2013   "injections for ~ 1 yr; don't really know what for" (07/12/2013)  . HEEL SPUR SURGERY Left 2008  . KNEE SURGERY  2005  . LEFT HEART CATHETERIZATION WITH CORONARY ANGIOGRAM N/A 01/30/2013   Procedure: LEFT HEART CATHETERIZATION WITH CORONARY ANGIOGRAM;  Surgeon: Clent Demark, MD;  Location: [facility] CATH LAB;  Service: Cardiovascular;  Laterality: N/A;  . PLANTAR FASCIA RELEASE Left 2008  . SPIROMETRY  04/25/14   In hosp for acute asthma/COPD flare: mixed obstructive and restrictive lung disease. The FEV1 is severely reduced at 45% predicted.  FEV1 signif decreased compared to prior spirometry 07/23/13.  . TENDON RELEASE  1996   Right forearm and hand  . TOTAL ABDOMINAL HYSTERECTOMY  1974  . TRANSTHORACIC ECHOCARDIOGRAM  01/2013; 04/2014;08/2015; 09/2017   2014--NORMAL.  2015--focal basal septal hypertrophy, EF 55-60%, grade I diast dysfxn, mild LAE.  08/2015 EF 55-60%, nl LV syst fxn, grade I DD, valves wnl. 10/21/17: EF 65-70%, grd I DD, o/w normal.   Past Medical History:  Diagnosis Date  . Acute upper GI bleed 06/2014   while pt taking coumadin, plavix, and meloxicam---despite being told not to take coumadin.  . Anginal pain ([place])    Nonobstructive CAD 2014; however, her cardiologist put her on a statin for this and NOT for hyperlipidemia per pt report.  Atyp CP 08/2017 at card f/u, plan for myoc perf imaging.  . Anxiety    panic attacks  . Asthma    w/ asbestososis   . BPPV (benign paroxysmal  positional vertigo) 12/16/2012  . Chronic diastolic CHF (congestive heart failure) (HCC)    dry wt as of 11/06/16 is 168 lbs.  . Chronic lower back pain   . COPD (chronic obstructive pulmonary disease) ([place])   . DDD (degenerative disc disease)    lumbar and cervical.   . Diverticular disease   . Fibromyalgia    Patient states dx was around her late 34s but she had sx's for years prior to this.  . H/O hiatal hernia   . Hay fever   . History of pneumonia    hospitalized 12/2011,  02/2013, and 07/2013 [facility]) for this  . HTN (hypertension)    Renal artery dopplers 04/2013 neg for stenosis.  . Idiopathic angio-edema-urticaria 72014   Angioedema component was very minimal  . Insomnia   . Iron deficiency anemia    Hematologist in [place], [place] did extensive w/u; no cause found; failed oral supplement;; gets fairly regular (q26m or so) IV iron infusions (Venofer -iron sucrose- 200mg  with procrit.  "for 14 yr I've been getting blood work q month & getting infusions prn" (07/12/2013).  Dr. Marin Olp locally, iron infusions done, EPO deficiency dx'd  . Migraine syndrome    "not as often anymore; used to be ~ q wk" (07/12/2013)  . Mixed incontinence urge and stress   . Nephrolithiasis    "passed all on my own or they are still in there" (07/12/2013)  . Neuroleptic induced Parkinsonism ([place]) 2018   Dr. Carles Collet, neuro, saw her 11/24/17 and recommended d/c of abilify as first step.  D/c'd abilify and pt got complete recovery.  . OSA on CPAP    prior to move to [place]--had another sleep study 10/2015 w/pulm Dr. Camillo Flaming.  . Osteoarthritis    "severe; progressing fast" (07/12/2013); multiple joints-not surgical candidate for TKR (03/2015).  Triamcinolon knee injections by Dr. Tessa Lerner 12/2017.  [name] Kitchen Pernicious anemia 08/24/2014  . Pleural plaque with presence of asbestos 07/22/2013  . Pulmonary embolism ([place]) 07/2013   Dx at [facility] with very small peripheral upper lobe pe 07/2013: pt took coumadin for about 8-9 mo  . Pyelonephritis     "several times over the last 30 yr" (07/12/2013)  . RBBB (right bundle branch block)   . Recurrent major depression ([place])   . Recurrent UTI    hx of hospitalization for pyelonephritis; started abx prophylaxis 06/2015  . Syncope    Hypotensive; ED visit--Dr. Terrence Dupont did Cath--nonobstructive CAD, EF 55-60%.  In retrospect, suspect pt rx med misuse/polypharmacy  . Tension headache, chronic    There were no vitals taken for this visit.  Opioid Risk Score:   Fall Risk Score:  `1  Depression screen PHQ 2/9  Depression screen [facility] 2/9 02/24/2019 12/25/2018 09/02/2018 07/01/2018 05/27/2018 05/19/2018 03/11/2018  Decreased Interest 1 1 1 1 1  0 1  Down, Depressed, Hopeless 1 1 1 1 1  0 1  PHQ - 2 Score 2 2 2 2 2  0 2  Altered sleeping - - - - 0 - -  Tired, decreased energy - - - - 1 - -  Change in appetite - - - - 0 - -  Feeling bad or failure about yourself  - - - - 0 - -  Trouble concentrating - - - - 0 - -  Moving slowly or fidgety/restless - - - - 0 - -  Suicidal thoughts - - - - 0 - -  PHQ-9 Score - - - - 3 - -  Difficult doing work/chores - - - - Not difficult at all - -  Some recent data might be hidden     Review of Systems  Constitutional: Positive for unexpected weight change.  HENT: Negative.   Eyes: Negative.   Respiratory: Positive for shortness of breath.   Cardiovascular: Positive for leg swelling.  Gastrointestinal: Positive for constipation, nausea and vomiting.  Endocrine: Negative.   Genitourinary: Negative.   Musculoskeletal: Positive for gait problem and myalgias.  Skin: Positive for rash.  Allergic/Immunologic: Negative.   Hematological: Negative.   Psychiatric/Behavioral: The patient is nervous/anxious.   All other systems reviewed and  are negative.      Objective:   Physical Exam Vitals signs and nursing note reviewed.  Constitutional:      Appearance: Normal appearance.  Neck:     Musculoskeletal: Normal range of motion and neck supple.  Cardiovascular:      Rate and Rhythm: Normal rate and regular rhythm.     Pulses: Normal pulses.     Heart sounds: Normal heart sounds.  Pulmonary:     Effort: Pulmonary effort is normal.     Breath sounds: Normal breath sounds.  Musculoskeletal:     Comments: Normal Muscle Bulk and Muscle Testing Reveals:  Upper Extremities: Full ROM and Muscle Strength 5/5  Lumbar Hypersensitivity Lower Extremities: Full ROM and Muscle Strength 5/5 Arises from table with ease Narrow Based Gait   Skin:    General: Skin is warm and dry.  Neurological:     Mental Status: She is alert and oriented to person, place, and time.  Psychiatric:        Mood and Affect: Mood normal.        Behavior: Behavior normal.           Assessment & Plan:  1. Functional deficits secondary to Gait disorder:Continue with HEP.08/06/2019 2. Chronic Back pain/ Lumbar Radiculitis/fibromyalgia /R>L Knee OA Pain Management:08/06/2019 Refilled: Oxycodone 10/325mg one tablet every 6 hours #100. ContinueVoltaren Gel. We will continue the opioid monitoring program, this consists of regular clinic visits, examinations, urine drug screen, pill counts as well as use of [place] Controlled Substance reporting System. 3. Depression with anxiety/Grief reaction/Mood:08/06/2019 Continue Xanax,Trazodone and Cymbalta .  4. [name] with asthma:. Albuterol prn. :Pulmonology Following.08/06/2019 5. Bilateral Osteoarthritis of Bilateral Knees: S/PBilateralZilretta Injectionwith relief noted.08/06/2019 6. Fibromyalgia: Continue Home exercise regime.08/06/2019 7. Cervicalgia/ Cervical Radiculitis:Continue HEP as tolerated. Alternate with heat and ice therapy. Continue to monitor.08/06/2019 8.RightGreater Trochanteric Bursitis:No complaints today.  Continue toAlternate Ice and Heat Therapy. Continue to Monitor.08/06/2019. 9. Chronic Bilateral Shoulder Pain:No complaints  Today. Continue HEP as Tolerated. Continue current  medication regimen. Continue to Monitor.08/06/2019.  15 minutes of face to face patient care time was spent during this visit. All questions were encouraged and answered.  F/U in 1 month

## 2019-08-09 ENCOUNTER — Encounter: Payer: Self-pay | Admitting: Registered Nurse

## 2019-09-01 ENCOUNTER — Other Ambulatory Visit: Payer: Self-pay | Admitting: Family Medicine

## 2019-09-06 ENCOUNTER — Encounter: Payer: Medicare Other | Attending: Physical Medicine & Rehabilitation | Admitting: Registered Nurse

## 2019-09-06 ENCOUNTER — Other Ambulatory Visit: Payer: Self-pay

## 2019-09-06 VITALS — BP 104/64 | HR 75 | Temp 97.6°F | Ht 64.0 in | Wt 156.0 lb

## 2019-09-06 DIAGNOSIS — R2689 Other abnormalities of gait and mobility: Secondary | ICD-10-CM | POA: Diagnosis not present

## 2019-09-06 DIAGNOSIS — Z79899 Other long term (current) drug therapy: Secondary | ICD-10-CM | POA: Insufficient documentation

## 2019-09-06 DIAGNOSIS — M1711 Unilateral primary osteoarthritis, right knee: Secondary | ICD-10-CM

## 2019-09-06 DIAGNOSIS — M797 Fibromyalgia: Secondary | ICD-10-CM

## 2019-09-06 DIAGNOSIS — M5416 Radiculopathy, lumbar region: Secondary | ICD-10-CM

## 2019-09-06 DIAGNOSIS — M7062 Trochanteric bursitis, left hip: Secondary | ICD-10-CM | POA: Diagnosis not present

## 2019-09-06 DIAGNOSIS — G894 Chronic pain syndrome: Secondary | ICD-10-CM

## 2019-09-06 DIAGNOSIS — M7061 Trochanteric bursitis, right hip: Secondary | ICD-10-CM

## 2019-09-06 DIAGNOSIS — M1712 Unilateral primary osteoarthritis, left knee: Secondary | ICD-10-CM

## 2019-09-06 DIAGNOSIS — Z5181 Encounter for therapeutic drug level monitoring: Secondary | ICD-10-CM | POA: Insufficient documentation

## 2019-09-06 MED ORDER — OXYCODONE-ACETAMINOPHEN 10-325 MG PO TABS: 1.0000 | ORAL_TABLET | Freq: Four times a day (QID) | ORAL | 0 refills | Status: DC | PRN

## 2019-09-06 NOTE — Progress Notes (Signed)
Subjective:    Patient ID: Linda [name], female    DOB: [birth year], 73 y.o.   MRN: [id number]  HPI: Linda [name] is a 73 y.o. female who returns for follow up appointment for chronic pain and medication refill. She states her pain is located in her lower back radiating into her bilateral hips R>L and bilateral lower extremities. Also reports she has pain in her bilateral knees. She rates her  Pain 5. Her  current exercise regime is walking.  Linda [name] Morphine equivalent is 60.00MME. She  is also prescribed Alprazolam by Dr. Ernestine Conrad. We have discussed the black box warning of using opioids and benzodiazepines. I highlighted the dangers of using these drugs together and discussed the adverse events including respiratory suppression, overdose, cognitive impairment and importance of compliance with current regimen. We will continue to monitor and adjust as indicated.   Last UDS was Performed on 07/14/2019, it was consistent.    Pain Inventory Average Pain 7 Pain Right Now 5 My pain is constant, sharp, stabbing and aching  In the last 24 hours, has pain interfered with the following? General activity 8 Relation with others 9 Enjoyment of life 8 What TIME of day is your pain at its worst? morning,evening,night Sleep (in general) Fair  Pain is worse with: walking, bending, standing and some activites Pain improves with: rest, medication and injections Relief from Meds: 5  Mobility walk with assistance use a cane how many minutes can you walk? 10 ability to climb steps?  no do you drive?  yes Do you have any goals in this area?  yes  Function retired I need assistance with the following:  meal prep, household duties and shopping Do you have any goals in this area?  yes  Neuro/Psych bowel control problems weakness trouble walking anxiety  Prior Studies Any changes since last visit?  no  Physicians involved in your care Any changes since last visit?  no    Family History  Problem Relation Age of Onset  . Arthritis Mother   . Kidney disease Mother   . Heart disease Father   . Stroke Father   . Hypertension Father   . Diabetes Father   . Heart attack Father   . Heart attack Paternal Grandmother   . Diabetes Sister        one sister  . Hypertension Sister   . Hypertension Brother   . Multiple sclerosis Son    Social History   Socioeconomic History  . Marital status: Widowed    Spouse name: Not on file  . Number of children: 2  . Years of education: Not on file  . Highest education level: Not on file  Occupational History  . 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 Financial resource strain: Not on file  . Food insecurity    Worry: Not on file    Inability: Not on file  . Transportation needs    Medical: Not on file    Non-medical: Not on file  Tobacco Use  . Smoking status: Never Smoker  . Smokeless tobacco: Never Used  . Tobacco comment: never used tobacco  Substance and Sexual Activity  . Alcohol use: No    Alcohol/week: 0.0 standard drinks  . Drug use: No  . Sexual activity: Not Currently  Lifestyle  . Physical activity    Days per week: Not on file    Minutes per session: Not on file  .  Stress: Not on file  Relationships  . Social Herbalist on phone: Not on file    Gets together: Not on file    Attends religious service: Not on file    Active member of club or organization: Not on file    Attends meetings of clubs or organizations: Not on file    Relationship status: Not on file  Other Topics Concern  . Not on file  Social History Narrative   Widowed, 2 sons.  Relocated to [place] 09/2012 to be closer to her son who has MS.   Husband d 2015--mesothelioma.   Occupation: former Pharmacist, hospital.   Education: masters degree level.   No T/A/Ds.   Lives in [place], independent living.   Past Surgical History:  Procedure Laterality Date  . APPENDECTOMY  1960  . AXILLARY  SURGERY Left 1978   Multiple "lump" in armpit per pt  . CARDIAC CATHETERIZATION  01/2013   nonobstructive CAD, EF 55-60%  . CARDIOVASCULAR STRESS TEST  02/22/15   Low risk myocard perf imaging; wall motion normal, normal EF  . carotid duplex doppler  10/21/2017   R vertebral flow suggestive of possible distal obstruction.  Pt declined further w/u as of 10/29/17 but need to revisit this problem periodically.  . COCCYX REMOVAL  1972  . DEXA  06/05/2017   T-score -3.1  . DILATION AND CURETTAGE OF UTERUS  ? 1970's  . ESOPHAGOGASTRODUODENOSCOPY N/A 07/19/2014   Gastritis found + in the setting of supratherapeutic INR, +plavix, + meloxicam.  . EYE SURGERY Left 2012-2013   "injections for ~ 1 yr; don't really know what for" (07/12/2013)  . HEEL SPUR SURGERY Left 2008  . KNEE SURGERY  2005  . LEFT HEART CATHETERIZATION WITH CORONARY ANGIOGRAM N/A 01/30/2013   Procedure: LEFT HEART CATHETERIZATION WITH CORONARY ANGIOGRAM;  Surgeon: Clent Demark, MD;  Location: [facility] CATH LAB;  Service: Cardiovascular;  Laterality: N/A;  . PLANTAR FASCIA RELEASE Left 2008  . SPIROMETRY  04/25/14   In hosp for acute asthma/COPD flare: mixed obstructive and restrictive lung disease. The FEV1 is severely reduced at 45% predicted.  FEV1 signif decreased compared to prior spirometry 07/23/13.  . TENDON RELEASE  1996   Right forearm and hand  . TOTAL ABDOMINAL HYSTERECTOMY  1974  . TRANSTHORACIC ECHOCARDIOGRAM  01/2013; 04/2014;08/2015; 09/2017   2014--NORMAL.  2015--focal basal septal hypertrophy, EF 55-60%, grade I diast dysfxn, mild LAE.  08/2015 EF 55-60%, nl LV syst fxn, grade I DD, valves wnl. 10/21/17: EF 65-70%, grd I DD, o/w normal.   Past Medical History:  Diagnosis Date  . Acute upper GI bleed 06/2014   while pt taking coumadin, plavix, and meloxicam---despite being told not to take coumadin.  . Anginal pain ([place])    Nonobstructive CAD 2014; however, her cardiologist put her on a statin for this and NOT for  hyperlipidemia per pt report.  Atyp CP 08/2017 at card f/u, plan for myoc perf imaging.  . Anxiety    panic attacks  . Asthma    w/ asbestososis   . BPPV (benign paroxysmal positional vertigo) 12/16/2012  . Chronic diastolic CHF (congestive heart failure) (HCC)    dry wt as of 11/06/16 is 168 lbs.  . Chronic lower back pain   . COPD (chronic obstructive pulmonary disease) ([place])   . DDD (degenerative disc disease)    lumbar and cervical.   . Diverticular disease   . Fibromyalgia    Patient states dx was around  her late 31s but she had sx's for years prior to this.  . H/O hiatal hernia   . Hay fever   . History of pneumonia    hospitalized 12/2011, 02/2013, and 07/2013 [facility]) for this  . HTN (hypertension)    Renal artery dopplers 04/2013 neg for stenosis.  . Idiopathic angio-edema-urticaria 72014   Angioedema component was very minimal  . Insomnia   . Iron deficiency anemia    Hematologist in [place] did extensive w/u; no cause found; failed oral supplement;; gets fairly regular (q48m or so) IV iron infusions (Venofer -iron sucrose- 200mg  with procrit.  "for 14 yr I've been getting blood work q month & getting infusions prn" (07/12/2013).  Dr. Marin Olp locally, iron infusions done, EPO deficiency dx'd  . Migraine syndrome    "not as often anymore; used to be ~ q wk" (07/12/2013)  . Mixed incontinence urge and stress   . Nephrolithiasis    "passed all on my own or they are still in there" (07/12/2013)  . Neuroleptic induced Parkinsonism ([place]) 2018   Dr. Carles Collet, neuro, saw her 11/24/17 and recommended d/c of abilify as first step.  D/c'd abilify and pt got complete recovery.  . OSA on CPAP    prior to move to [place]--had another sleep study 10/2015 w/pulm Dr. Camillo Flaming.  . Osteoarthritis    "severe; progressing fast" (07/12/2013); multiple joints-not surgical candidate for TKR (03/2015).  Triamcinolon knee injections by Dr. Tessa Lerner 12/2017.  [name] Kitchen Pernicious anemia 08/24/2014  . Pleural plaque with  presence of asbestos 07/22/2013  . Pulmonary embolism ([place]) 07/2013   Dx at [facility] with very small peripheral upper lobe pe 07/2013: pt took coumadin for about 8-9 mo  . Pyelonephritis    "several times over the last 30 yr" (07/12/2013)  . RBBB (right bundle branch block)   . Recurrent major depression ([place])   . Recurrent UTI    hx of hospitalization for pyelonephritis; started abx prophylaxis 06/2015  . Syncope    Hypotensive; ED visit--Dr. Terrence Dupont did Cath--nonobstructive CAD, EF 55-60%.  In retrospect, suspect pt rx med misuse/polypharmacy  . Tension headache, chronic    BP 104/64   Pulse 75   Temp 97.6 F (36.4 C)   Ht 5\' 4"  (1.626 m)   Wt 156 lb (70.8 kg)   SpO2 93%   BMI 26.78 kg/m   Opioid Risk Score:   Fall Risk Score:  `1  Depression screen PHQ 2/9  Depression screen [facility] 2/9 02/24/2019 12/25/2018 09/02/2018 07/01/2018 05/27/2018 05/19/2018 03/11/2018  Decreased Interest 1 1 1 1 1  0 1  Down, Depressed, Hopeless 1 1 1 1 1  0 1  PHQ - 2 Score 2 2 2 2 2  0 2  Altered sleeping - - - - 0 - -  Tired, decreased energy - - - - 1 - -  Change in appetite - - - - 0 - -  Feeling bad or failure about yourself  - - - - 0 - -  Trouble concentrating - - - - 0 - -  Moving slowly or fidgety/restless - - - - 0 - -  Suicidal thoughts - - - - 0 - -  PHQ-9 Score - - - - 3 - -  Difficult doing work/chores - - - - Not difficult at all - -  Some recent data might be hidden    Review of Systems  Constitutional: Negative.   HENT: Negative.   Eyes: Negative.   Respiratory: Positive for shortness of  breath.   Gastrointestinal: Positive for constipation and nausea.  Endocrine: Negative.   Genitourinary: Negative.   Musculoskeletal: Positive for back pain, gait problem and joint swelling.  Skin: Negative.   Allergic/Immunologic: Negative.   Neurological: Positive for weakness.  Hematological: Negative.   Psychiatric/Behavioral: The patient is nervous/anxious.        Objective:   Physical Exam  Vitals signs and nursing note reviewed.  Constitutional:      Appearance: Normal appearance.  Neck:     Musculoskeletal: Normal range of motion and neck supple.     Comments: Cervical Paraspinal Tenderness: C-5-C-6 Cardiovascular:     Rate and Rhythm: Normal rate and regular rhythm.     Pulses: Normal pulses.     Heart sounds: Normal heart sounds.  Pulmonary:     Effort: Pulmonary effort is normal.     Breath sounds: Normal breath sounds.  Musculoskeletal:     Comments: Normal Muscle Bulk and Muscle Testing Reveals:  Upper Extremities: Full ROM and Muscle Strength 5/5 Thoracic Paraspinal Tenderness: T-7-T-9 Lumbar Paraspinal Tenderness: L-3-l-5 Bilateral Greater Trochanter Tenderness Lower Extremities: Full ROM and Muscle Strength 5/5 Bilateral Lower Extremities Flexion Produces Pain into Bilateral Patella's Arises from Chair slowly Antalgic Gait   Skin:    General: Skin is warm and dry.  Neurological:     Mental Status: She is alert and oriented to person, place, and time.  Psychiatric:        Mood and Affect: Mood normal.        Behavior: Behavior normal.           Assessment & Plan:  1. Functional deficits secondary to Gait disorder:Continue with HEP.09/06/2019 2. Chronic Back pain/ Lumbar Radiculitis/fibromyalgia /R>L Knee OA Pain Management:09/06/2019 Refilled: Oxycodone 10/325mg one tablet every 6 hours #100. ContinueVoltaren Gel. We will continue the opioid monitoring program, this consists of regular clinic visits, examinations, urine drug screen, pill counts as well as use of [place] Controlled Substance reporting System. 3. Depression with anxiety/Grief reaction/Mood:09/06/2019 Continue Xanax,Trazodone and Cymbalta . PCP Following.  4. [name] with asthma:. Albuterol prn. :Pulmonology Following.09/06/2019 5. Bilateral Osteoarthritis of Bilateral Knees: S/PBilateralZilretta Injections on 07/22/2020with relief noted.Continue to Monitor.  09/06/2019 6. Fibromyalgia: Continue Home exercise regimen as tolerated. Continue to Monitor.09/06/2019 7. Cervicalgia/ Cervical Radiculitis:Continue HEP as tolerated. Alternate with heat and ice therapy. Continue to monitor.09/06/2019 8.Bilateral Greater Trochanteric Bursitis: Continue toAlternate Ice and Heat Therapy. Continue to Monitor.09/06/2019. 9. Chronic Bilateral Shoulder Pain:No complaints Today. Continue HEP as Tolerated. Continue current medication regimen. Continue to Monitor.09/06/2019.  15 minutes of face to face patient care time was spent during this visit. All questions were encouraged and answered.  F/U in 1 month

## 2019-09-07 ENCOUNTER — Encounter: Payer: Self-pay | Admitting: Registered Nurse

## 2019-09-13 ENCOUNTER — Other Ambulatory Visit: Payer: Self-pay

## 2019-09-13 ENCOUNTER — Other Ambulatory Visit: Payer: Self-pay | Admitting: Family Medicine

## 2019-09-13 NOTE — Telephone Encounter (Signed)
Rx sent for nasal spray

## 2019-09-17 DIAGNOSIS — Z23 Encounter for immunization: Secondary | ICD-10-CM | POA: Diagnosis not present

## 2019-09-21 NOTE — Progress Notes (Signed)
Virtual Visit via Video Note changed to phone visit at patient request.   This visit type was conducted due to national recommendations for restrictions regarding the COVID-19 Pandemic (e.g. social distancing) in an effort to limit this patient's exposure and mitigate transmission in our community.  Due to her co-morbid illnesses, this patient is at least at moderate risk for complications without adequate follow up.  This format is felt to be most appropriate for this patient at this time.  All issues noted in this document were discussed and addressed.  A limited physical exam was performed with this format.  Please refer to the patient's chart for her consent to telehealth for [facility].   Date:  09/23/2019   ID:  Linda [name], DOB Dec 12, 1946, MRN [id number]  Patient Location:Home Provider Location: Home  PCP:  Tammi Sou, MD  Cardiologist:  Dr Stanford Breed  Evaluation Performed:  Follow-Up Visit  Chief Complaint:  FU diastolic CHF  History of Present Illness:    FU diastolic congestive heart failure. Cardiac catheterization February 2014 showed nonobstructive disease (most significant 50-60 RCA) and normal LV function; note of 4+ MR. Renal Dopplers May 2014 showed no stenosis. Also with history of presumed orthostatic syncope. She has had problems with diastolic CHF. Nuclear study October 2018 showed ejection fraction 81%, probable shifting breast attenuation though very mild distal anteroseptal ischemia cannot be excluded. Treated medically. Echocardiogram October 2018 showed normal LV function, grade 1 diastolic dysfunction. Carotid Dopplers October 2018 showed 1 to 39% bilateral stenosis. Since last seen, has some DOE; no pedal edema; continues with occasional CP (both with exertion and at rest); no change compared to previous.  The patient does not have symptoms concerning for COVID-19 infection (fever, chills, cough, or new shortness of breath).    Past Medical  History:  Diagnosis Date  . Acute upper GI bleed 06/2014   while pt taking coumadin, plavix, and meloxicam---despite being told not to take coumadin.  . Anginal pain ([place])    Nonobstructive CAD 2014; however, her cardiologist put her on a statin for this and NOT for hyperlipidemia per pt report.  Atyp CP 08/2017 at card f/u, plan for myoc perf imaging.  . Anxiety    panic attacks  . Asthma    w/ asbestososis   . BPPV (benign paroxysmal positional vertigo) 12/16/2012  . Chronic diastolic CHF (congestive heart failure) (HCC)    dry wt as of 11/06/16 is 168 lbs.  . Chronic lower back pain   . COPD (chronic obstructive pulmonary disease) ([place])   . DDD (degenerative disc disease)    lumbar and cervical.   . Diverticular disease   . Fibromyalgia    Patient states dx was around her late 21s but she had sx's for years prior to this.  . H/O hiatal hernia   . Hay fever   . History of pneumonia    hospitalized 12/2011, 02/2013, and 07/2013 [facility]) for this  . HTN (hypertension)    Renal artery dopplers 04/2013 neg for stenosis.  . Idiopathic angio-edema-urticaria 72014   Angioedema component was very minimal  . Insomnia   . Iron deficiency anemia    Hematologist in [place], [place] did extensive w/u; no cause found; failed oral supplement;; gets fairly regular (q57m or so) IV iron infusions (Venofer -iron sucrose- 200mg  with procrit.  "for 14 yr I've been getting blood work q month & getting infusions prn" (07/12/2013).  Dr. Marin Olp locally, iron infusions done, EPO deficiency dx'd  .  Migraine syndrome    "not as often anymore; used to be ~ q wk" (07/12/2013)  . Mixed incontinence urge and stress   . Nephrolithiasis    "passed all on my own or they are still in there" (07/12/2013)  . Neuroleptic induced Parkinsonism ([place]) 2018   Dr. Carles Collet, neuro, saw her 11/24/17 and recommended d/c of abilify as first step.  D/c'd abilify and pt got complete recovery.  . OSA on CPAP    prior to move to [place]--had another  sleep study 10/2015 w/pulm Dr. Camillo Flaming.  . Osteoarthritis    "severe; progressing fast" (07/12/2013); multiple joints-not surgical candidate for TKR (03/2015).  Triamcinolon knee injections by Dr. Tessa Lerner 12/2017.  [name] Kitchen Pernicious anemia 08/24/2014  . Pleural plaque with presence of asbestos 07/22/2013  . Pulmonary embolism ([place]) 07/2013   Dx at [facility] with very small peripheral upper lobe pe 07/2013: pt took coumadin for about 8-9 mo  . Pyelonephritis    "several times over the last 30 yr" (07/12/2013)  . RBBB (right bundle branch block)   . Recurrent major depression ([place])   . Recurrent UTI    hx of hospitalization for pyelonephritis; started abx prophylaxis 06/2015  . Syncope    Hypotensive; ED visit--Dr. Terrence Dupont did Cath--nonobstructive CAD, EF 55-60%.  In retrospect, suspect pt rx med misuse/polypharmacy  . Tension headache, chronic    Past Surgical History:  Procedure Laterality Date  . APPENDECTOMY  1960  . AXILLARY SURGERY Left 1978   Multiple "lump" in armpit per pt  . CARDIAC CATHETERIZATION  01/2013   nonobstructive CAD, EF 55-60%  . CARDIOVASCULAR STRESS TEST  02/22/15   Low risk myocard perf imaging; wall motion normal, normal EF  . carotid duplex doppler  10/21/2017   R vertebral flow suggestive of possible distal obstruction.  Pt declined further w/u as of 10/29/17 but need to revisit this problem periodically.  . COCCYX REMOVAL  1972  . DEXA  06/05/2017   T-score -3.1  . DILATION AND CURETTAGE OF UTERUS  ? 1970's  . ESOPHAGOGASTRODUODENOSCOPY N/A 07/19/2014   Gastritis found + in the setting of supratherapeutic INR, +plavix, + meloxicam.  . EYE SURGERY Left 2012-2013   "injections for ~ 1 yr; don't really know what for" (07/12/2013)  . HEEL SPUR SURGERY Left 2008  . KNEE SURGERY  2005  . LEFT HEART CATHETERIZATION WITH CORONARY ANGIOGRAM N/A 01/30/2013   Procedure: LEFT HEART CATHETERIZATION WITH CORONARY ANGIOGRAM;  Surgeon: Clent Demark, MD;  Location: [facility] CATH LAB;  Service:  Cardiovascular;  Laterality: N/A;  . PLANTAR FASCIA RELEASE Left 2008  . SPIROMETRY  04/25/14   In hosp for acute asthma/COPD flare: mixed obstructive and restrictive lung disease. The FEV1 is severely reduced at 45% predicted.  FEV1 signif decreased compared to prior spirometry 07/23/13.  . TENDON RELEASE  1996   Right forearm and hand  . TOTAL ABDOMINAL HYSTERECTOMY  1974  . TRANSTHORACIC ECHOCARDIOGRAM  01/2013; 04/2014;08/2015; 09/2017   2014--NORMAL.  2015--focal basal septal hypertrophy, EF 55-60%, grade I diast dysfxn, mild LAE.  08/2015 EF 55-60%, nl LV syst fxn, grade I DD, valves wnl. 10/21/17: EF 65-70%, grd I DD, o/w normal.     Current Meds  Medication Sig  . alendronate (FOSAMAX) 70 MG tablet Take 1 tablet (70 mg total) by mouth every 7 (seven) days. Take with a full glass of water on an empty stomach, first thing in the morning and remain up right for 30 minutes after taking.  [name] Kitchen ALPRAZolam (  XANAX) 1 MG tablet TAKE 1 TABLET THREE TIMES DAILY AS NEEDED FOR ANXIETY.  [name] Kitchen aspirin 81 MG tablet Take 81 mg by mouth at bedtime.  . diclofenac sodium (VOLTAREN) 1 % GEL Apply 2 g topically 3 (three) times daily as needed (pain).  [name] Kitchen donepezil (ARICEPT) 5 MG tablet 1 tab po qd  . DULoxetine (CYMBALTA) 30 MG capsule TAKE 1 CAPSULE ONCE DAILY ALONG WITH 60MG  CAPSULE FOR TOTAL 90MG  DAILY.  . DULoxetine (CYMBALTA) 60 MG capsule TAKE 1 CAPSULE A DAY TO BE COMBINED WITH 30MG  CAPSULE  . fluconazole (DIFLUCAN) 100 MG tablet 1 tab po qd prn thrush  . fluticasone (FLONASE) 50 MCG/ACT nasal spray USE 2 SPRAYS IN EACH NOSTRIL ONCE DAILY  . furosemide (LASIX) 40 MG tablet Take 1 tablet (40 mg total) by mouth daily. (Patient taking differently: Take 40 mg by mouth daily as needed. )  . isosorbide mononitrate (IMDUR) 30 MG 24 hr tablet TAKE 2 TABLETS IN THE AM AND 1 TABLET IN THE PM.  . lamoTRIgine (LAMICTAL) 100 MG tablet TAKE 1 TABLET EACH DAY.  . metoprolol succinate (TOPROL-XL) 50 MG 24 hr tablet TAKE (1)  TABLET DAILY IN THE MORNING.  . Multiple Vitamin (MULTIVITAMIN WITH MINERALS) TABS tablet Take 1 tablet by mouth daily.  . nitroGLYCERIN (NITROSTAT) 0.4 MG SL tablet Place 1 tablet (0.4 mg total) under the tongue every 5 (five) minutes as needed for chest pain (x 3 doses).  . ondansetron (ZOFRAN) 8 MG tablet Take 1 tablet (8 mg total) by mouth every 8 (eight) hours as needed for nausea or vomiting.  [name] Kitchen oxyCODONE-acetaminophen (PERCOCET) 10-325 MG tablet Take 1 tablet by mouth every 6 (six) hours as needed for pain.  . pantoprazole (PROTONIX) 40 MG tablet TAKE 1 TABLET BY MOUTH DAILY.  . traZODone (DESYREL) 50 MG tablet TAKE 2-4 TABLETS AT BEDTIME  . Vitamin D, Ergocalciferol, (DRISDOL) 1.25 MG (50000 UT) CAPS capsule TAKE (1) CAPSULE TWICE A WEEK.     Allergies:   Abilify [aripiprazole] and Penicillins   Social History   Tobacco Use  . Smoking status: Never Smoker  . Smokeless tobacco: Never Used  . Tobacco comment: never used tobacco  Substance Use Topics  . Alcohol use: No    Alcohol/week: 0.0 standard drinks  . Drug use: No     Family Hx: The patient's family history includes Arthritis in her mother; Diabetes in her father and sister; Heart attack in her father and paternal grandmother; Heart disease in her father; Hypertension in her brother, father, and sister; Kidney disease in her mother; Multiple sclerosis in her son; Stroke in her father.  ROS:   Please see the history of present illness.    No Fever, chills  or productive cough; unsteady; arthralgias; fibromyalgia; back pain; nausea; dizziness All other systems reviewed and are negative.   Recent Labs: 07/23/2019: BUN 15; Creatinine, Ser 0.87; Hemoglobin 14.8; Platelets 256; Potassium 4.5; Sodium 138   Recent Lipid Panel Lab Results  Component Value Date/Time   CHOL 172 09/22/2018 10:37 AM   TRIG 184.0 (H) 09/22/2018 10:37 AM   HDL 43.70 09/22/2018 10:37 AM   CHOLHDL 4 09/22/2018 10:37 AM   LDLCALC 92 09/22/2018  10:37 AM    Wt Readings from Last 3 Encounters:  09/23/19 153 lb (69.4 kg)  09/06/19 156 lb (70.8 kg)  08/06/19 153 lb (69.4 kg)     Objective:    Vital Signs:  Ht 5\' 4"  (1.626 m)   Wt 153 lb (69.4  kg)   BMI 26.26 kg/m    VITAL SIGNS:  reviewed NAD Answers questions appropriately Normal affect Remainder of physical examination not performed (telehealth visit; coronavirus pandemic)  ASSESSMENT & PLAN:    1. Chronic diastolic congestive heart failure-patient appears to be doing well from a volume standpoint based on history.  Continue present dose of diuretic (lasix as needed).  Continue fluid restriction and low-sodium diet.  Check potassium and renal function. 2. Coronary artery disease-no chest pain.  Continue aspirin and resume statin. 3. Hypertension-blood pressure is controlled today.  Continue present medical regimen and follow. 4. Hyperlipidemia-resume crestor 20 mg daily; lipids and liver six weeks. 5. History of chest pain-symptoms are chronic and unchanged; will not pursue further ischemia evaluation unless symptoms worsen.  COVID-19 Education: The importance of social distancing was discussed today.  Time:   Today, I have spent 19 minutes with the patient with telehealth technology discussing the above problems.     Medication Adjustments/Labs and Tests Ordered: Current medicines are reviewed at length with the patient today.  Concerns regarding medicines are outlined above.   Tests Ordered: No orders of the defined types were placed in this encounter.   Medication Changes: No orders of the defined types were placed in this encounter.   Follow Up:  Virtual Visit or In Person in 1 year(s)  Signed, [name], MD  09/23/2019 8:14 AM    [place]

## 2019-09-23 ENCOUNTER — Telehealth (INDEPENDENT_AMBULATORY_CARE_PROVIDER_SITE_OTHER): Payer: Medicare Other | Admitting: Cardiology

## 2019-09-23 ENCOUNTER — Encounter: Payer: Self-pay | Admitting: Cardiology

## 2019-09-23 VITALS — Ht 64.0 in | Wt 153.0 lb

## 2019-09-23 DIAGNOSIS — I5032 Chronic diastolic (congestive) heart failure: Secondary | ICD-10-CM

## 2019-09-23 DIAGNOSIS — I2510 Atherosclerotic heart disease of native coronary artery without angina pectoris: Secondary | ICD-10-CM

## 2019-09-23 DIAGNOSIS — E7800 Pure hypercholesterolemia, unspecified: Secondary | ICD-10-CM

## 2019-09-23 DIAGNOSIS — I10 Essential (primary) hypertension: Secondary | ICD-10-CM

## 2019-09-23 MED ORDER — ROSUVASTATIN CALCIUM 20 MG PO TABS: 20.0000 mg | ORAL_TABLET | Freq: Every day | ORAL | 3 refills | Status: DC

## 2019-09-23 MED ORDER — NITROGLYCERIN 0.4 MG SL SUBL: 0.4000 mg | SUBLINGUAL_TABLET | SUBLINGUAL | 11 refills | Status: DC | PRN

## 2019-09-23 NOTE — Patient Instructions (Signed)
Medication Instructions:  START ROSUVASTATIN 20 MG ONCE DAILY  If you need a refill on your cardiac medications before your next appointment, please call your pharmacy.   Lab work: Your physician recommends that you return for lab work in: [place]  If you have labs (blood work) drawn today and your tests are completely normal, you will receive your results only by: [name] Kitchen MyChart Message (if you have MyChart) OR . A paper copy in the mail If you have any lab test that is abnormal or we need to change your treatment, we will call you to review the results.  Follow-Up: At [facility], you and your health needs are our priority.  As part of our continuing mission to provide you with exceptional heart care, we have created designated Provider Care Teams.  These Care Teams include your primary Cardiologist (physician) and Advanced Practice Providers (APPs -  Physician Assistants and Nurse Practitioners) who all work together to provide you with the care you need, when you need it. You will need a follow up appointment in 12 months.  Please call our office 2 months in advance to schedule this appointment.  You may see [name] MD or one of the following Advanced Practice Providers on your designated Care Team:   Kerin Ransom, PA-C Roby Lofts, [place] . Sande Rives, PA-C

## 2019-09-24 ENCOUNTER — Other Ambulatory Visit: Payer: Self-pay | Admitting: Cardiology

## 2019-09-27 ENCOUNTER — Other Ambulatory Visit: Payer: Self-pay

## 2019-09-27 ENCOUNTER — Telehealth: Payer: Self-pay

## 2019-09-27 ENCOUNTER — Encounter: Payer: Self-pay | Admitting: Family Medicine

## 2019-09-27 ENCOUNTER — Ambulatory Visit (INDEPENDENT_AMBULATORY_CARE_PROVIDER_SITE_OTHER): Payer: Medicare Other | Admitting: Family Medicine

## 2019-09-27 VITALS — BP 106/68 | HR 78 | Temp 98.7°F | Resp 16 | Ht 64.0 in | Wt 155.4 lb

## 2019-09-27 DIAGNOSIS — F339 Major depressive disorder, recurrent, unspecified: Secondary | ICD-10-CM

## 2019-09-27 DIAGNOSIS — J310 Chronic rhinitis: Secondary | ICD-10-CM | POA: Diagnosis not present

## 2019-09-27 DIAGNOSIS — L719 Rosacea, unspecified: Secondary | ICD-10-CM | POA: Diagnosis not present

## 2019-09-27 DIAGNOSIS — Z1231 Encounter for screening mammogram for malignant neoplasm of breast: Secondary | ICD-10-CM

## 2019-09-27 DIAGNOSIS — E781 Pure hyperglyceridemia: Secondary | ICD-10-CM

## 2019-09-27 DIAGNOSIS — I10 Essential (primary) hypertension: Secondary | ICD-10-CM

## 2019-09-27 DIAGNOSIS — M810 Age-related osteoporosis without current pathological fracture: Secondary | ICD-10-CM | POA: Diagnosis not present

## 2019-09-27 DIAGNOSIS — I2510 Atherosclerotic heart disease of native coronary artery without angina pectoris: Secondary | ICD-10-CM

## 2019-09-27 DIAGNOSIS — E673 Hypervitaminosis D: Secondary | ICD-10-CM

## 2019-09-27 HISTORY — DX: Hypervitaminosis D: E67.3

## 2019-09-27 LAB — COMPREHENSIVE METABOLIC PANEL
ALT: 10 U/L (ref 0–35)
AST: 16 U/L (ref 0–37)
Albumin: 3.9 g/dL (ref 3.5–5.2)
Alkaline Phosphatase: 67 U/L (ref 39–117)
BUN: 11 mg/dL (ref 6–23)
CO2: 28 mEq/L (ref 19–32)
Calcium: 9 mg/dL (ref 8.4–10.5)
Chloride: 104 mEq/L (ref 96–112)
Creatinine, Ser: 0.69 mg/dL (ref 0.40–1.20)
GFR: 83.28 mL/min (ref >60.00)
Glucose, Bld: 108 mg/dL — ABNORMAL HIGH (ref 70–99)
Potassium: 3.7 mEq/L (ref 3.5–5.1)
Sodium: 141 mEq/L (ref 135–145)
Total Bilirubin: 0.5 mg/dL (ref 0.2–1.2)
Total Protein: 6.2 g/dL (ref 6.0–8.3)

## 2019-09-27 LAB — VITAMIN D 25 HYDROXY (VIT D DEFICIENCY, FRACTURES): VITD: 102.69 ng/mL (ref 30.00–100.00)

## 2019-09-27 MED ORDER — LAMOTRIGINE 150 MG PO TABS: 150.0000 mg | ORAL_TABLET | Freq: Every day | ORAL | 1 refills | Status: DC

## 2019-09-27 MED ORDER — MINOCYCLINE HCL 50 MG PO TABS: 50.0000 mg | ORAL_TABLET | Freq: Two times a day (BID) | ORAL | 1 refills | Status: DC

## 2019-09-27 MED ORDER — IPRATROPIUM BROMIDE 0.03 % NA SOLN: 2.0000 | Freq: Two times a day (BID) | NASAL | 11 refills | Status: DC

## 2019-09-27 MED ORDER — FLUTICASONE PROPIONATE 50 MCG/ACT NA SUSP: 2.0000 | Freq: Every day | NASAL | 11 refills | Status: DC

## 2019-09-27 NOTE — Progress Notes (Addendum)
OFFICE VISIT  09/27/2019   CC:  Chief Complaint  Patient presents with  . Follow-up    RCI, pt is fasting   HPI:    Patient is a 73 y.o. Caucasian female who presents for f/u recurrent MDD, GAD, atypical/protracted grief response, HTN, and HLD.  GAD: anxiety level generally ok given her quarantine circumstances, but mood consistently lower than her baseline.  No highs.  No side effects from any of her psych meds at this time.  No SI or HI.  HLD: Dr. Stanford Breed recently got her back on crestor 20 mg, plans on repeat labs 6 wks. HTN: bp good here.  Home bp monitoring not being done lately.  Says rosacea is getting worse, asks for something stronger than the metrogel I've had her on for quite a while. No itching or pain.  Gustatory rhinitis helped a lot by atrovent nasal spray.  Asks for more rx.  Past Medical History:  Diagnosis Date  . Acute upper GI bleed 06/2014   while pt taking coumadin, plavix, and meloxicam---despite being told not to take coumadin.  . Anginal pain ([place])    Nonobstructive CAD 2014; however, her cardiologist put her on a statin for this and NOT for hyperlipidemia per pt report.  Atyp CP 08/2017 at card f/u, plan for myoc perf imaging.  . Anxiety    panic attacks  . Asthma    w/ asbestososis   . BPPV (benign paroxysmal positional vertigo) 12/16/2012  . Chronic diastolic CHF (congestive heart failure) (HCC)    dry wt as of 11/06/16 is 168 lbs.  . Chronic lower back pain   . COPD (chronic obstructive pulmonary disease) ([place])   . DDD (degenerative disc disease)    lumbar and cervical.   . Diverticular disease   . Fibromyalgia    Patient states dx was around her late 43s but she had sx's for years prior to this.  . H/O hiatal hernia   . Hay fever   . History of pneumonia    hospitalized 12/2011, 02/2013, and 07/2013 [facility]) for this  . HTN (hypertension)    Renal artery dopplers 04/2013 neg for stenosis.  . Idiopathic angio-edema-urticaria 72014   Angioedema  component was very minimal  . Insomnia   . Iron deficiency anemia    Hematologist in [place], [place] did extensive w/u; no cause found; failed oral supplement;; gets fairly regular (q64m or so) IV iron infusions (Venofer -iron sucrose- 200mg  with procrit.  "for 14 yr I've been getting blood work q month & getting infusions prn" (07/12/2013).  Dr. Marin Olp locally, iron infusions done, EPO deficiency dx'd  . Migraine syndrome    "not as often anymore; used to be ~ q wk" (07/12/2013)  . Mixed incontinence urge and stress   . Nephrolithiasis    "passed all on my own or they are still in there" (07/12/2013)  . Neuroleptic induced Parkinsonism ([place]) 2018   Dr. Carles Collet, neuro, saw her 11/24/17 and recommended d/c of abilify as first step.  D/c'd abilify and pt got complete recovery.  . OSA on CPAP    prior to move to [place]--had another sleep study 10/2015 w/pulm Dr. Camillo Flaming.  . Osteoarthritis    "severe; progressing fast" (07/12/2013); multiple joints-not surgical candidate for TKR (03/2015).  Triamcinolon knee injections by Dr. Tessa Lerner 12/2017.  [name] Kitchen Pernicious anemia 08/24/2014  . Pleural plaque with presence of asbestos 07/22/2013  . Pulmonary embolism ([place]) 07/2013   Dx at [facility] with very small peripheral upper lobe  pe 07/2013: pt took coumadin for about 8-9 mo  . Pyelonephritis    "several times over the last 30 yr" (07/12/2013)  . RBBB (right bundle branch block)   . Recurrent major depression ([place])   . Recurrent UTI    hx of hospitalization for pyelonephritis; started abx prophylaxis 06/2015  . Syncope    Hypotensive; ED visit--Dr. Terrence Dupont did Cath--nonobstructive CAD, EF 55-60%.  In retrospect, suspect pt rx med misuse/polypharmacy  . Tension headache, chronic     Past Surgical History:  Procedure Laterality Date  . APPENDECTOMY  1960  . AXILLARY SURGERY Left 1978   Multiple "lump" in armpit per pt  . CARDIAC CATHETERIZATION  01/2013   nonobstructive CAD, EF 55-60%  . CARDIOVASCULAR STRESS TEST  02/22/15   Low  risk myocard perf imaging; wall motion normal, normal EF  . carotid duplex doppler  10/21/2017   R vertebral flow suggestive of possible distal obstruction.  Pt declined further w/u as of 10/29/17 but need to revisit this problem periodically.  . COCCYX REMOVAL  1972  . DEXA  06/05/2017   T-score -3.1  . DILATION AND CURETTAGE OF UTERUS  ? 1970's  . ESOPHAGOGASTRODUODENOSCOPY N/A 07/19/2014   Gastritis found + in the setting of supratherapeutic INR, +plavix, + meloxicam.  . EYE SURGERY Left 2012-2013   "injections for ~ 1 yr; don't really know what for" (07/12/2013)  . HEEL SPUR SURGERY Left 2008  . KNEE SURGERY  2005  . LEFT HEART CATHETERIZATION WITH CORONARY ANGIOGRAM N/A 01/30/2013   Procedure: LEFT HEART CATHETERIZATION WITH CORONARY ANGIOGRAM;  Surgeon: Clent Demark, MD;  Location: [facility] C[facility] LAB;  Service: Cardiovascular;  Laterality: N/A;  . PLANTAR FASCIA RELEASE Left 2008  . SPIROMETRY  04/25/14   In hosp for acute asthma/COPD flare: mixed obstructive and restrictive lung disease. The FEV1 is severely reduced at 45% predicted.  FEV1 signif decreased compared to prior spirometry 07/23/13.  . TENDON RELEASE  1996   Right forearm and hand  . TOTAL ABDOMINAL HYSTERECTOMY  1974  . TRANSTHORACIC ECHOCARDIOGRAM  01/2013; 04/2014;08/2015; 09/2017   2014--NORMAL.  2015--focal basal septal hypertrophy, EF 55-60%, grade I diast dysfxn, mild LAE.  08/2015 EF 55-60%, nl LV syst fxn, grade I DD, valves wnl. 10/21/17: EF 65-70%, grd I DD, o/w normal.    Outpatient Medications Prior to Visit  Medication Sig Dispense Refill  . alendronate (FOSAMAX) 70 MG tablet Take 1 tablet (70 mg total) by mouth every 7 (seven) days. Take with a full glass of water on an empty stomach, first thing in the morning and remain up right for 30 minutes after taking. 12 tablet 3  . ALPRAZolam (XANAX) 1 MG tablet TAKE 1 TABLET THREE TIMES DAILY AS NEEDED FOR ANXIETY. 90 tablet 5  . aspirin 81 MG tablet Take 81 mg by mouth at  bedtime.    . diclofenac sodium (VOLTAREN) 1 % GEL Apply 2 g topically 3 (three) times daily as needed (pain). 2 Tube 2  . donepezil (ARICEPT) 5 MG tablet 1 tab po qd 30 tablet 1  . DULoxetine (CYMBALTA) 30 MG capsule TAKE 1 CAPSULE ONCE DAILY ALONG WITH 60MG  CAPSULE FOR TOTAL 90MG  DAILY. 90 capsule 1  . DULoxetine (CYMBALTA) 60 MG capsule TAKE 1 CAPSULE A DAY TO BE COMBINED WITH 30MG  CAPSULE 90 capsule 3  . fluconazole (DIFLUCAN) 100 MG tablet 1 tab po qd prn thrush 60 tablet 1  . furosemide (LASIX) 40 MG tablet Take 1 tablet (40 mg total)  by mouth daily. (Patient taking differently: Take 40 mg by mouth daily as needed. ) 60 tablet 11  . isosorbide mononitrate (IMDUR) 30 MG 24 hr tablet TAKE 2 TABLETS IN THE AM AND 1 TABLET IN THE PM. 90 tablet 3  . metoprolol succinate (TOPROL-XL) 50 MG 24 hr tablet TAKE (1) TABLET DAILY IN THE MORNING. 90 tablet 3  . Multiple Vitamin (MULTIVITAMIN WITH MINERALS) TABS tablet Take 1 tablet by mouth daily.    . ondansetron (ZOFRAN) 8 MG tablet Take 1 tablet (8 mg total) by mouth every 8 (eight) hours as needed for nausea or vomiting. 20 tablet 1  . oxyCODONE-acetaminophen (PERCOCET) 10-325 MG tablet Take 1 tablet by mouth every 6 (six) hours as needed for pain. 100 tablet 0  . pantoprazole (PROTONIX) 40 MG tablet TAKE 1 TABLET BY MOUTH DAILY. 90 tablet 0  . rosuvastatin (CRESTOR) 20 MG tablet Take 1 tablet (20 mg total) by mouth daily. 90 tablet 3  . traZODone (DESYREL) 50 MG tablet TAKE 2-4 TABLETS AT BEDTIME 120 tablet 5  . Vitamin D, Ergocalciferol, (DRISDOL) 1.25 MG (50000 UT) CAPS capsule TAKE (1) CAPSULE TWICE A WEEK. 24 capsule 0  . fluticasone (FLONASE) 50 MCG/ACT nasal spray USE 2 SPRAYS IN EACH NOSTRIL ONCE DAILY 16 g 0  . lamoTRIgine (LAMICTAL) 100 MG tablet TAKE 1 TABLET EACH DAY. 90 tablet 1  . nitroGLYCERIN (NITROSTAT) 0.4 MG SL tablet Place 1 tablet (0.4 mg total) under the tongue every 5 (five) minutes as needed for chest pain (x 3 doses). (Patient  not taking: Reported on 09/27/2019) 25 tablet 11   Facility-Administered Medications Prior to Visit  Medication Dose Route Frequency Provider Last Rate Last Dose  . ferumoxytol (FERAHEME) 510 mg in sodium chloride 0.9 % 100 mL IVPB  510 mg Intravenous Once Volanda Napoleon, MD        Allergies  Allergen Reactions  . Abilify [Aripiprazole] Other (See Comments)    parkinsonism  . Penicillins Itching, Swelling and Rash    Tolerated Cefepime in ED. Has patient had a PCN reaction causing immediate rash, facial/tongue/throat swelling, SOB or lightheadedness with hypotension: Yes Has patient had a PCN reaction causing severe rash involving mucus membranes or skin necrosis: No Has patient had a PCN reaction that required hospitalization: No  Has patient had a PCN reaction occurring within the last 10 years: No     ROS As per HPI  PE: Blood pressure 106/68, pulse 78, temperature 98.7 F (37.1 C), temperature source Temporal, resp. rate 16, height 5\' 4"  (1.626 m), weight 155 lb 6.4 oz (70.5 kg), SpO2 92 %. Body mass index is 26.67 kg/m.  Gen: Alert, well appearing.  Patient is oriented to person, place, time, and situation. AFFECT: pleasant, lucid thought and speech. CV: RRR, no m/r/g.   LUNGS: CTA bilat, nonlabored resps, good aeration in all lung fields. EXT: no clubbing or cyanosis.  no edema.  Face: she has make-up on that prevents visualization of her skin/rash at all.  No nodules/pustules/erosions are noted.  LABS:  Lab Results  Component Value Date   TSH 1.15 09/22/2018   Lab Results  Component Value Date   WBC 9.0 07/23/2019   HGB 14.8 07/23/2019   HCT 45.7 07/23/2019   MCV 91.8 07/23/2019   PLT 256 07/23/2019   Lab Results  Component Value Date   IRON 116 06/05/2015   TIBC 284 06/05/2015   FERRITIN 1,612 (H) 06/05/2015   Lab Results  Component Value Date  VITAMINB12 349 01/02/2016    Lab Results  Component Value Date   CREATININE 0.87 07/23/2019   BUN 15  07/23/2019   NA 138 07/23/2019   K 4.5 07/23/2019   CL 103 07/23/2019   CO2 25 07/23/2019   Lab Results  Component Value Date   ALT 16 01/01/2018   AST 29 01/01/2018   ALKPHOS 58 01/01/2018   BILITOT 1.2 01/01/2018   Lab Results  Component Value Date   CHOL 172 09/22/2018   Lab Results  Component Value Date   HDL 43.70 09/22/2018   Lab Results  Component Value Date   LDLCALC 92 09/22/2018   Lab Results  Component Value Date   TRIG 184.0 (H) 09/22/2018   Lab Results  Component Value Date   CHOLHDL 4 09/22/2018   Lab Results  Component Value Date   HGBA1C 5.6 01/02/2016    IMPRESSION AND PLAN:  1) HTN: The current medical regimen is effective;  continue present plan and medications. Lytes/cr today.  2) HLD: rosuva 20mg  just restarted by Dr. Stanford Breed 4 d/a so he'll be rechecking her lipids in 6 wks or so.  Hepatic panel today.  3) GAD: stable.    4) MDD: in a bit of a down swing but only mild and mainly having to do with the isolation of the quarantine rules of her living situation.  Increase lamictal to 150mg  qd, continue cymbalta 90 mg qd.  5) Rosacea: will start minocycline 50mg  bid.  6) Gustatory rhinitis: good response to ipratropium nasal spray so I rx'd this again for her today.  7) Preventative health care: She declined to schedule mammogram or bone density testing at this time b/c of living situation/transportation prob's and other inconveniences.  Will address again at future visit.  An After Visit Summary was printed and given to the patient.  FOLLOW UP: Return in about 2 months (around 11/27/2019) for routine chronic illness f/u.  Signed:  Crissie Sickles, MD           09/27/2019   ADDENDUM 09/28/19, 11:45 AM: Pt called and stated that she forgot to ask if she could increase her donepezil to 10mg  since she feels no effect from the 5mg  dose.  I said this would be fine, rx for 10mg  donepezil eRx'd. Signed:  Crissie Sickles, MD           09/28/2019

## 2019-09-27 NOTE — Telephone Encounter (Signed)
Noted. Result note has been sent to [facility].

## 2019-09-27 NOTE — Telephone Encounter (Signed)
Critical lab called in by [facility] [place] lab. Reporting pts Vit D is critical at 103. Sent to Dr Anitra Lauth to review. Labs in chart.

## 2019-09-28 ENCOUNTER — Telehealth: Payer: Self-pay | Admitting: Family Medicine

## 2019-09-28 ENCOUNTER — Other Ambulatory Visit: Payer: Self-pay | Admitting: Family Medicine

## 2019-09-28 ENCOUNTER — Other Ambulatory Visit: Payer: Self-pay | Admitting: Cardiology

## 2019-09-28 MED ORDER — DONEPEZIL HCL 10 MG PO TABS: 10.0000 mg | ORAL_TABLET | Freq: Every day | ORAL | 6 refills | Status: DC

## 2019-09-28 NOTE — Telephone Encounter (Signed)
Yes, she may increase donepezil from 5mg  to 10mg  daily. I'll send in new rx.

## 2019-09-28 NOTE — Addendum Note (Signed)
Addended by: Tammi Sou on: 09/28/2019 11:50 AM   Modules accepted: Orders

## 2019-09-28 NOTE — Telephone Encounter (Signed)
Please call regarding her visit from yesterday. She had a few questions about meds,etc  ?  She can be contacted at [phone]

## 2019-09-28 NOTE — Telephone Encounter (Signed)
Patient was notified regarding Rx increase and med being sent.

## 2019-09-28 NOTE — Telephone Encounter (Signed)
Spoke with patient, she wanted to know if she can increase dosage for donepezil from 5 to 10mg . She does not feel like it is helping currently. She forgot to mention it during her appt yesterday.   Please advise, thanks.

## 2019-10-04 ENCOUNTER — Emergency Department (HOSPITAL_COMMUNITY): Payer: Medicare Other

## 2019-10-04 ENCOUNTER — Encounter: Payer: Self-pay | Admitting: Registered Nurse

## 2019-10-04 ENCOUNTER — Encounter (HOSPITAL_COMMUNITY): Payer: Self-pay | Admitting: *Deleted

## 2019-10-04 ENCOUNTER — Other Ambulatory Visit: Payer: Self-pay

## 2019-10-04 ENCOUNTER — Encounter (HOSPITAL_COMMUNITY): Payer: Self-pay | Admitting: Emergency Medicine

## 2019-10-04 ENCOUNTER — Encounter: Payer: Medicare Other | Attending: Physical Medicine & Rehabilitation | Admitting: Registered Nurse

## 2019-10-04 ENCOUNTER — Inpatient Hospital Stay (HOSPITAL_COMMUNITY)
Admission: EM | Admit: 2019-10-04 | Discharge: 2019-10-07 | DRG: 315 | Disposition: A | Discharge status: Home / Self Care | Payer: Medicare Other | Source: Ambulatory Visit | Attending: Internal Medicine | Admitting: Internal Medicine

## 2019-10-04 VITALS — BP 90/57 | HR 77 | Temp 98.5°F | Ht 64.0 in | Wt 155.0 lb

## 2019-10-04 DIAGNOSIS — M7061 Trochanteric bursitis, right hip: Secondary | ICD-10-CM

## 2019-10-04 DIAGNOSIS — I5032 Chronic diastolic (congestive) heart failure: Secondary | ICD-10-CM | POA: Diagnosis present

## 2019-10-04 DIAGNOSIS — E876 Hypokalemia: Secondary | ICD-10-CM | POA: Diagnosis present

## 2019-10-04 DIAGNOSIS — I952 Hypotension due to drugs: Secondary | ICD-10-CM | POA: Diagnosis not present

## 2019-10-04 DIAGNOSIS — R627 Adult failure to thrive: Secondary | ICD-10-CM | POA: Diagnosis present

## 2019-10-04 DIAGNOSIS — Z7982 Long term (current) use of aspirin: Secondary | ICD-10-CM | POA: Diagnosis not present

## 2019-10-04 DIAGNOSIS — I2510 Atherosclerotic heart disease of native coronary artery without angina pectoris: Secondary | ICD-10-CM | POA: Diagnosis present

## 2019-10-04 DIAGNOSIS — Z86711 Personal history of pulmonary embolism: Secondary | ICD-10-CM | POA: Diagnosis present

## 2019-10-04 DIAGNOSIS — Z823 Family history of stroke: Secondary | ICD-10-CM

## 2019-10-04 DIAGNOSIS — M7062 Trochanteric bursitis, left hip: Secondary | ICD-10-CM | POA: Diagnosis not present

## 2019-10-04 DIAGNOSIS — F0390 Unspecified dementia without behavioral disturbance: Secondary | ICD-10-CM | POA: Diagnosis present

## 2019-10-04 DIAGNOSIS — J301 Allergic rhinitis due to pollen: Secondary | ICD-10-CM | POA: Diagnosis present

## 2019-10-04 DIAGNOSIS — R531 Weakness: Secondary | ICD-10-CM

## 2019-10-04 DIAGNOSIS — D509 Iron deficiency anemia, unspecified: Secondary | ICD-10-CM | POA: Diagnosis present

## 2019-10-04 DIAGNOSIS — Z841 Family history of disorders of kidney and ureter: Secondary | ICD-10-CM

## 2019-10-04 DIAGNOSIS — Z8701 Personal history of pneumonia (recurrent): Secondary | ICD-10-CM

## 2019-10-04 DIAGNOSIS — N390 Urinary tract infection, site not specified: Secondary | ICD-10-CM | POA: Diagnosis present

## 2019-10-04 DIAGNOSIS — R0902 Hypoxemia: Secondary | ICD-10-CM | POA: Diagnosis not present

## 2019-10-04 DIAGNOSIS — R2689 Other abnormalities of gait and mobility: Secondary | ICD-10-CM | POA: Insufficient documentation

## 2019-10-04 DIAGNOSIS — I9589 Other hypotension: Secondary | ICD-10-CM | POA: Diagnosis not present

## 2019-10-04 DIAGNOSIS — M797 Fibromyalgia: Secondary | ICD-10-CM | POA: Diagnosis present

## 2019-10-04 DIAGNOSIS — M1711 Unilateral primary osteoarthritis, right knee: Secondary | ICD-10-CM | POA: Insufficient documentation

## 2019-10-04 DIAGNOSIS — J439 Emphysema, unspecified: Secondary | ICD-10-CM | POA: Diagnosis not present

## 2019-10-04 DIAGNOSIS — E785 Hyperlipidemia, unspecified: Secondary | ICD-10-CM | POA: Diagnosis present

## 2019-10-04 DIAGNOSIS — F411 Generalized anxiety disorder: Secondary | ICD-10-CM | POA: Diagnosis not present

## 2019-10-04 DIAGNOSIS — Z20828 Contact with and (suspected) exposure to other viral communicable diseases: Secondary | ICD-10-CM | POA: Diagnosis present

## 2019-10-04 DIAGNOSIS — R5381 Other malaise: Secondary | ICD-10-CM | POA: Diagnosis not present

## 2019-10-04 DIAGNOSIS — Z7401 Bed confinement status: Secondary | ICD-10-CM | POA: Diagnosis not present

## 2019-10-04 DIAGNOSIS — Z5181 Encounter for therapeutic drug level monitoring: Secondary | ICD-10-CM | POA: Diagnosis not present

## 2019-10-04 DIAGNOSIS — F339 Major depressive disorder, recurrent, unspecified: Secondary | ICD-10-CM | POA: Diagnosis present

## 2019-10-04 DIAGNOSIS — Z79899 Other long term (current) drug therapy: Secondary | ICD-10-CM

## 2019-10-04 DIAGNOSIS — Z7983 Long term (current) use of bisphosphonates: Secondary | ICD-10-CM | POA: Diagnosis not present

## 2019-10-04 DIAGNOSIS — G894 Chronic pain syndrome: Secondary | ICD-10-CM

## 2019-10-04 DIAGNOSIS — M5416 Radiculopathy, lumbar region: Secondary | ICD-10-CM

## 2019-10-04 DIAGNOSIS — M2550 Pain in unspecified joint: Secondary | ICD-10-CM | POA: Diagnosis not present

## 2019-10-04 DIAGNOSIS — M1712 Unilateral primary osteoarthritis, left knee: Secondary | ICD-10-CM

## 2019-10-04 DIAGNOSIS — F410 Panic disorder [episodic paroxysmal anxiety] without agoraphobia: Secondary | ICD-10-CM | POA: Diagnosis present

## 2019-10-04 DIAGNOSIS — I950 Idiopathic hypotension: Secondary | ICD-10-CM | POA: Diagnosis not present

## 2019-10-04 DIAGNOSIS — Z820 Family history of epilepsy and other diseases of the nervous system: Secondary | ICD-10-CM

## 2019-10-04 DIAGNOSIS — I959 Hypotension, unspecified: Secondary | ICD-10-CM | POA: Diagnosis not present

## 2019-10-04 DIAGNOSIS — Z8249 Family history of ischemic heart disease and other diseases of the circulatory system: Secondary | ICD-10-CM

## 2019-10-04 DIAGNOSIS — Z8261 Family history of arthritis: Secondary | ICD-10-CM

## 2019-10-04 DIAGNOSIS — J449 Chronic obstructive pulmonary disease, unspecified: Secondary | ICD-10-CM | POA: Diagnosis present

## 2019-10-04 DIAGNOSIS — Z880 Allergy status to penicillin: Secondary | ICD-10-CM | POA: Diagnosis not present

## 2019-10-04 DIAGNOSIS — I4510 Unspecified right bundle-branch block: Secondary | ICD-10-CM | POA: Diagnosis not present

## 2019-10-04 DIAGNOSIS — I10 Essential (primary) hypertension: Secondary | ICD-10-CM | POA: Diagnosis present

## 2019-10-04 DIAGNOSIS — G8929 Other chronic pain: Secondary | ICD-10-CM | POA: Diagnosis present

## 2019-10-04 DIAGNOSIS — I5030 Unspecified diastolic (congestive) heart failure: Secondary | ICD-10-CM | POA: Diagnosis present

## 2019-10-04 DIAGNOSIS — I110 Hypertensive heart disease with heart failure: Secondary | ICD-10-CM | POA: Diagnosis present

## 2019-10-04 DIAGNOSIS — G4700 Insomnia, unspecified: Secondary | ICD-10-CM | POA: Diagnosis present

## 2019-10-04 LAB — CBC WITH DIFFERENTIAL/PLATELET
Abs Immature Granulocytes: 0.02 10*3/uL (ref 0.00–0.07)
Basophils Absolute: 0.1 10*3/uL (ref 0.0–0.1)
Basophils Relative: 1 %
Eosinophils Absolute: 0.2 10*3/uL (ref 0.0–0.5)
Eosinophils Relative: 2 %
HCT: 39.4 % (ref 36.0–46.0)
Hemoglobin: 12.9 g/dL (ref 12.0–15.0)
Immature Granulocytes: 0 %
Lymphocytes Relative: 32 %
Lymphs Abs: 2.3 10*3/uL (ref 0.7–4.0)
MCH: 30.3 pg (ref 26.0–34.0)
MCHC: 32.7 g/dL (ref 30.0–36.0)
MCV: 92.5 fL (ref 80.0–100.0)
Monocytes Absolute: 0.7 10*3/uL (ref 0.1–1.0)
Monocytes Relative: 9 %
Neutro Abs: 3.9 10*3/uL (ref 1.7–7.7)
Neutrophils Relative %: 56 %
Platelets: 197 10*3/uL (ref 150–400)
RBC: 4.26 MIL/uL (ref 3.87–5.11)
RDW: 12.4 % (ref 11.5–15.5)
WBC: 7.1 10*3/uL (ref 4.0–10.5)
nRBC: 0 % (ref 0.0–0.2)

## 2019-10-04 LAB — COMPREHENSIVE METABOLIC PANEL
ALT: 13 U/L (ref 0–44)
AST: 22 U/L (ref 15–41)
Albumin: 3.5 g/dL (ref 3.5–5.0)
Alkaline Phosphatase: 65 U/L (ref 38–126)
Anion gap: 13 (ref 5–15)
BUN: 10 mg/dL (ref 8–23)
CO2: 25 mmol/L (ref 22–32)
Calcium: 8.6 mg/dL — ABNORMAL LOW (ref 8.9–10.3)
Chloride: 99 mmol/L (ref 98–111)
Creatinine, Ser: 0.96 mg/dL (ref 0.44–1.00)
GFR calc Af Amer: >60 mL/min (ref >60)
GFR calc non Af Amer: 59 mL/min — ABNORMAL LOW (ref >60)
Glucose, Bld: 129 mg/dL — ABNORMAL HIGH (ref 70–99)
Potassium: 3.1 mmol/L — ABNORMAL LOW (ref 3.5–5.1)
Sodium: 137 mmol/L (ref 135–145)
Total Bilirubin: 0.6 mg/dL (ref 0.3–1.2)
Total Protein: 6.3 g/dL — ABNORMAL LOW (ref 6.5–8.1)

## 2019-10-04 LAB — LACTIC ACID, PLASMA
Lactic Acid, Venous: 1.9 mmol/L (ref 0.5–1.9)
Lactic Acid, Venous: 1.2 mmol/L (ref 0.5–1.9)

## 2019-10-04 LAB — URINALYSIS, ROUTINE W REFLEX MICROSCOPIC
Bilirubin Urine: NEGATIVE
Glucose, UA: NEGATIVE mg/dL
Hgb urine dipstick: NEGATIVE
Ketones, ur: NEGATIVE mg/dL
Leukocytes,Ua: NEGATIVE
Nitrite: POSITIVE — AB
Protein, ur: NEGATIVE mg/dL
Specific Gravity, Urine: 1.004 — ABNORMAL LOW (ref 1.005–1.030)
pH: 6 (ref 5.0–8.0)

## 2019-10-04 LAB — C-REACTIVE PROTEIN: CRP: <0.8 mg/dL (ref <1.0)

## 2019-10-04 LAB — PROTIME-INR
INR: 1 (ref 0.8–1.2)
Prothrombin Time: 13 s (ref 11.4–15.2)

## 2019-10-04 LAB — MAGNESIUM: Magnesium: 2 mg/dL (ref 1.7–2.4)

## 2019-10-04 LAB — TSH: TSH: 2.75 u[IU]/mL (ref 0.350–4.500)

## 2019-10-04 LAB — SARS CORONAVIRUS 2 (TAT 6-24 HRS): SARS Coronavirus 2: NEGATIVE

## 2019-10-04 MED ORDER — CEFTRIAXONE SODIUM 1 G IJ SOLR
1.0000 g | INTRAMUSCULAR | Status: DC
  Administered 2019-10-04: 1 g via INTRAMUSCULAR
  Filled 2019-10-04: qty 10

## 2019-10-04 MED ORDER — DONEPEZIL HCL 10 MG PO TABS
10.0000 mg | ORAL_TABLET | Freq: Every day | ORAL | Status: DC
  Administered 2019-10-04 – 2019-10-06 (×3): 10 mg via ORAL
  Filled 2019-10-04 (×4): qty 1

## 2019-10-04 MED ORDER — IPRATROPIUM BROMIDE 0.06 % NA SOLN
2.0000 | Freq: Two times a day (BID) | NASAL | Status: DC
  Administered 2019-10-05 – 2019-10-07 (×5): 2 via NASAL
  Filled 2019-10-04: qty 15

## 2019-10-04 MED ORDER — OXYCODONE-ACETAMINOPHEN 5-325 MG PO TABS
1.0000 | ORAL_TABLET | Freq: Four times a day (QID) | ORAL | Status: DC | PRN
  Administered 2019-10-04 – 2019-10-06 (×7): 1 via ORAL
  Filled 2019-10-04 (×7): qty 1

## 2019-10-04 MED ORDER — ONDANSETRON HCL 4 MG PO TABS
4.0000 mg | ORAL_TABLET | Freq: Four times a day (QID) | ORAL | Status: DC | PRN
  Administered 2019-10-07: 4 mg via ORAL
  Filled 2019-10-04: qty 1

## 2019-10-04 MED ORDER — NITROGLYCERIN 0.4 MG SL SUBL: 0.4000 mg | SUBLINGUAL_TABLET | SUBLINGUAL | Status: DC | PRN

## 2019-10-04 MED ORDER — ENOXAPARIN SODIUM 30 MG/0.3ML ~~LOC~~ SOLN: 30.0000 mg | SUBCUTANEOUS | Status: DC

## 2019-10-04 MED ORDER — POTASSIUM CHLORIDE CRYS ER 20 MEQ PO TBCR
40.0000 meq | EXTENDED_RELEASE_TABLET | Freq: Once | ORAL | Status: AC
  Administered 2019-10-04: 40 meq via ORAL
  Filled 2019-10-04: qty 2

## 2019-10-04 MED ORDER — LAMOTRIGINE 150 MG PO TABS
150.0000 mg | ORAL_TABLET | Freq: Every day | ORAL | Status: DC
  Administered 2019-10-05 – 2019-10-07 (×3): 150 mg via ORAL
  Filled 2019-10-04 (×4): qty 1

## 2019-10-04 MED ORDER — FENTANYL CITRATE (PF) 100 MCG/2ML IJ SOLN
30.0000 ug | INTRAMUSCULAR | Status: DC | PRN
  Administered 2019-10-04 – 2019-10-06 (×5): 30 ug via INTRAVENOUS
  Filled 2019-10-04 (×5): qty 2

## 2019-10-04 MED ORDER — METOPROLOL SUCCINATE ER 50 MG PO TB24
50.0000 mg | ORAL_TABLET | Freq: Every day | ORAL | Status: DC
  Administered 2019-10-05 – 2019-10-07 (×3): 50 mg via ORAL
  Filled 2019-10-04 (×3): qty 1

## 2019-10-04 MED ORDER — DEXTROSE-NACL 5-0.45 % IV SOLN
INTRAVENOUS | Status: DC
  Administered 2019-10-04 – 2019-10-06 (×3): via INTRAVENOUS

## 2019-10-04 MED ORDER — FLUTICASONE PROPIONATE 50 MCG/ACT NA SUSP
2.0000 | Freq: Every day | NASAL | Status: DC
  Administered 2019-10-05 – 2019-10-07 (×3): 2 via NASAL
  Filled 2019-10-04: qty 16

## 2019-10-04 MED ORDER — IPRATROPIUM BROMIDE 0.03 % NA SOLN
2.0000 | Freq: Two times a day (BID) | NASAL | Status: DC
  Filled 2019-10-04: qty 30

## 2019-10-04 MED ORDER — LIDOCAINE HCL (PF) 1 % IJ SOLN
INTRAMUSCULAR | Status: AC
  Filled 2019-10-04: qty 5

## 2019-10-04 MED ORDER — PANTOPRAZOLE SODIUM 40 MG PO TBEC
40.0000 mg | DELAYED_RELEASE_TABLET | Freq: Every day | ORAL | Status: DC
  Administered 2019-10-05 – 2019-10-07 (×3): 40 mg via ORAL
  Filled 2019-10-04 (×3): qty 1

## 2019-10-04 MED ORDER — DULOXETINE HCL 60 MG PO CPEP
60.0000 mg | ORAL_CAPSULE | Freq: Every day | ORAL | Status: DC
  Administered 2019-10-05 – 2019-10-07 (×3): 60 mg via ORAL
  Filled 2019-10-04 (×4): qty 1

## 2019-10-04 MED ORDER — SODIUM CHLORIDE 0.9 % IV BOLUS
500.0000 mL | Freq: Once | INTRAVENOUS | Status: AC
  Administered 2019-10-04: 500 mL via INTRAVENOUS

## 2019-10-04 MED ORDER — OXYCODONE-ACETAMINOPHEN 10-325 MG PO TABS: 1.0000 | ORAL_TABLET | Freq: Four times a day (QID) | ORAL | Status: DC | PRN

## 2019-10-04 MED ORDER — POTASSIUM CHLORIDE 10 MEQ/100ML IV SOLN
10.0000 meq | INTRAVENOUS | Status: AC
  Administered 2019-10-04: 10 meq via INTRAVENOUS
  Filled 2019-10-04: qty 100

## 2019-10-04 MED ORDER — OXYCODONE HCL 5 MG PO TABS
5.0000 mg | ORAL_TABLET | Freq: Four times a day (QID) | ORAL | Status: DC | PRN
  Administered 2019-10-04 – 2019-10-06 (×7): 5 mg via ORAL
  Filled 2019-10-04 (×7): qty 1

## 2019-10-04 MED ORDER — ALPRAZOLAM 0.25 MG PO TABS
0.2500 mg | ORAL_TABLET | Freq: Three times a day (TID) | ORAL | Status: DC | PRN
  Administered 2019-10-05 – 2019-10-07 (×5): 0.25 mg via ORAL
  Filled 2019-10-04 (×5): qty 1

## 2019-10-04 MED ORDER — ASPIRIN 81 MG PO CHEW
81.0000 mg | CHEWABLE_TABLET | Freq: Every day | ORAL | Status: DC
  Administered 2019-10-04 – 2019-10-06 (×3): 81 mg via ORAL
  Filled 2019-10-04 (×3): qty 1

## 2019-10-04 MED ORDER — SODIUM CHLORIDE 0.9 % IV SOLN
1.0000 g | Freq: Once | INTRAVENOUS | Status: AC
  Administered 2019-10-04: 1 g via INTRAVENOUS
  Filled 2019-10-04: qty 10

## 2019-10-04 MED ORDER — TRAZODONE HCL 100 MG PO TABS
100.0000 mg | ORAL_TABLET | Freq: Every day | ORAL | Status: DC
  Administered 2019-10-04 – 2019-10-06 (×3): 100 mg via ORAL
  Filled 2019-10-04 (×3): qty 1

## 2019-10-04 MED ORDER — ROSUVASTATIN CALCIUM 20 MG PO TABS
20.0000 mg | ORAL_TABLET | Freq: Every day | ORAL | Status: DC
  Administered 2019-10-05 – 2019-10-07 (×3): 20 mg via ORAL
  Filled 2019-10-04 (×3): qty 1

## 2019-10-04 MED ORDER — ONDANSETRON HCL 4 MG/2ML IJ SOLN
4.0000 mg | Freq: Four times a day (QID) | INTRAMUSCULAR | Status: DC | PRN
  Administered 2019-10-06 (×2): 4 mg via INTRAVENOUS
  Filled 2019-10-04 (×2): qty 2

## 2019-10-04 MED ORDER — ENOXAPARIN SODIUM 40 MG/0.4ML ~~LOC~~ SOLN
40.0000 mg | SUBCUTANEOUS | Status: DC
  Administered 2019-10-04 – 2019-10-06 (×3): 40 mg via SUBCUTANEOUS
  Filled 2019-10-04 (×2): qty 0.4

## 2019-10-04 MED ORDER — DULOXETINE HCL 30 MG PO CPEP
30.0000 mg | ORAL_CAPSULE | Freq: Every day | ORAL | Status: DC
  Administered 2019-10-05 – 2019-10-06 (×2): 30 mg via ORAL
  Filled 2019-10-04 (×3): qty 1

## 2019-10-04 NOTE — Progress Notes (Signed)
Notified bedside nurse of need to administer antibiotics.  

## 2019-10-04 NOTE — Progress Notes (Signed)
Subjective:    Patient ID: Linda [name], female    DOB: 1946-07-17, 73 y.o.   MRN: [id number]  HPI: Linda [name] is a 73 y.o. female who returns for follow up appointment for chronic pain and medication refill. She states her pain is located in her neck, lower back pain radiating into her bilateral lower extremities, bilateral hip pain and bilateral knee pain R>L. She rates her pain 7. Her current exercise regime is walking.   Ms. Djuric arrived to office hypotensive with oxygen desaturation, she reports she is light headed and fatigue, orthostatic blood pressures was performed, medication list reviewed and EMS was called. She was transferred to [facility] ED for Evaluation via EMS.   Ms. Tuchman Morphine equivalent is 60.00  MME. She  is also prescribed Alprazolam by [name]. We have discussed the black box warning of using opioids and benzodiazepines. I highlighted the dangers of using these drugs together and discussed the adverse events including respiratory suppression, overdose, cognitive impairment and importance of compliance with current regimen. We will continue to monitor and adjust as indicated.  Last UDS was Performed on 07/14/2019, it was consistent.    Pain Inventory Average Pain 6 Pain Right Now 7 My pain is intermittent, burning, stabbing and aching  In the last 24 hours, has pain interfered with the following? General activity 9 Relation with others 9 Enjoyment of life 9 What TIME of day is your pain at its worst? night Sleep (in general) Fair  Pain is worse with: walking, bending, standing and some activites Pain improves with: rest, heat/ice, medication and injections Relief from Meds: 5  Mobility use a cane use a walker ability to climb steps?  no do you drive?  yes  Function retired I need assistance with the following:  meal prep, household duties and shopping  Neuro/Psych bladder control problems weakness trouble walking anxiety   Prior Studies Any changes since last visit?  no  Physicians involved in your care Any changes since last visit?  no   Family History  Problem Relation Age of Onset  . Arthritis Mother   . Kidney disease Mother   . Heart disease Father   . Stroke Father   . Hypertension Father   . Diabetes Father   . Heart attack Father   . Heart attack Paternal Grandmother   . Diabetes Sister        one sister  . Hypertension Sister   . Hypertension Brother   . Multiple sclerosis Son    Social History   Socioeconomic History  . Marital status: Widowed    Spouse name: Not on file  . Number of children: 2  . Years of education: Not on file  . Highest education level: Not on file  Occupational History  . 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 Financial resource strain: Not on file  . Food insecurity    Worry: Not on file    Inability: Not on file  . Transportation needs    Medical: Not on file    Non-medical: Not on file  Tobacco Use  . Smoking status: Never Smoker  . Smokeless tobacco: Never Used  . Tobacco comment: never used tobacco  Substance and Sexual Activity  . Alcohol use: No    Alcohol/week: 0.0 standard drinks  . Drug use: No  . Sexual activity: Not Currently  Lifestyle  . Physical activity    Days per week:  Not on file    Minutes per session: Not on file  . Stress: Not on file  Relationships  . Social Herbalist on phone: Not on file    Gets together: Not on file    Attends religious service: Not on file    Active member of club or organization: Not on file    Attends meetings of clubs or organizations: Not on file    Relationship status: Not on file  Other Topics Concern  . Not on file  Social History Narrative   Widowed, 2 sons.  Relocated to [place] 09/2012 to be closer to her son who has MS.   Husband d 2015--mesothelioma.   Occupation: former Pharmacist, hospital.   Education: masters degree level.   No T/A/Ds.   Lives in  [place], independent living.   Past Surgical History:  Procedure Laterality Date  . APPENDECTOMY  1960  . AXILLARY SURGERY Left 1978   Multiple "lump" in armpit per pt  . CARDIAC CATHETERIZATION  01/2013   nonobstructive CAD, EF 55-60%  . CARDIOVASCULAR STRESS TEST  02/22/15   Low risk myocard perf imaging; wall motion normal, normal EF  . carotid duplex doppler  10/21/2017   R vertebral flow suggestive of possible distal obstruction.  Pt declined further w/u as of 10/29/17 but need to revisit this problem periodically.  . COCCYX REMOVAL  1972  . DEXA  06/05/2017   T-score -3.1  . DILATION AND CURETTAGE OF UTERUS  ? 1970's  . ESOPHAGOGASTRODUODENOSCOPY N/A 07/19/2014   Gastritis found + in the setting of supratherapeutic INR, +plavix, + meloxicam.  . EYE SURGERY Left 2012-2013   "injections for ~ 1 yr; don't really know what for" (07/12/2013)  . HEEL SPUR SURGERY Left 2008  . KNEE SURGERY  2005  . LEFT HEART CATHETERIZATION WITH CORONARY ANGIOGRAM N/A 01/30/2013   Procedure: LEFT HEART CATHETERIZATION WITH CORONARY ANGIOGRAM;  Surgeon: Clent Demark, MD;  Location: [facility] CATH LAB;  Service: Cardiovascular;  Laterality: N/A;  . PLANTAR FASCIA RELEASE Left 2008  . SPIROMETRY  04/25/14   In hosp for acute asthma/COPD flare: mixed obstructive and restrictive lung disease. The FEV1 is severely reduced at 45% predicted.  FEV1 signif decreased compared to prior spirometry 07/23/13.  . TENDON RELEASE  1996   Right forearm and hand  . TOTAL ABDOMINAL HYSTERECTOMY  1974  . TRANSTHORACIC ECHOCARDIOGRAM  01/2013; 04/2014;08/2015; 09/2017   2014--NORMAL.  2015--focal basal septal hypertrophy, EF 55-60%, grade I diast dysfxn, mild LAE.  08/2015 EF 55-60%, nl LV syst fxn, grade I DD, valves wnl. 10/21/17: EF 65-70%, grd I DD, o/w normal.   Past Medical History:  Diagnosis Date  . Acute upper GI bleed 06/2014   while pt taking coumadin, plavix, and meloxicam---despite being told not to take coumadin.  .  Anginal pain ([place])    Nonobstructive CAD 2014; however, her cardiologist put her on a statin for this and NOT for hyperlipidemia per pt report.  Atyp CP 08/2017 at card f/u, plan for myoc perf imaging.  . Anxiety    panic attacks  . Asthma    w/ asbestososis   . BPPV (benign paroxysmal positional vertigo) 12/16/2012  . Chronic diastolic CHF (congestive heart failure) (HCC)    dry wt as of 11/06/16 is 168 lbs.  . Chronic lower back pain   . COPD (chronic obstructive pulmonary disease) ([place])   . DDD (degenerative disc disease)    lumbar and cervical.   .  Diverticular disease   . Fibromyalgia    Patient states dx was around her late 63s but she had sx's for years prior to this.  . H/O hiatal hernia   . Hay fever   . History of pneumonia    hospitalized 12/2011, 02/2013, and 07/2013 [facility] for this  . HTN (hypertension)    Renal artery dopplers 04/2013 neg for stenosis.  . Hypervitaminosis D 09/27/2019   over-supplemented.  Stopped vit D and plan recheck 2 mo.  . Idiopathic angio-edema-urticaria 72014   Angioedema component was very minimal  . Insomnia   . Iron deficiency anemia    Hematologist in [place], [place] did extensive w/u; no cause found; failed oral supplement;; gets fairly regular (q63m or so) IV iron infusions (Venofer -iron sucrose- 200mg  with procrit.  "for 14 yr I've been getting blood work q month & getting infusions prn" (07/12/2013).  Dr. Marin Olp locally, iron infusions done, EPO deficiency dx'd  . Migraine syndrome    "not as often anymore; used to be ~ q wk" (07/12/2013)  . Mixed incontinence urge and stress   . Nephrolithiasis    "passed all on my own or they are still in there" (07/12/2013)  . Neuroleptic induced Parkinsonism ([place]) 2018   Dr. Carles Collet, neuro, saw her 11/24/17 and recommended d/c of abilify as first step.  D/c'd abilify and pt got complete recovery.  . OSA on CPAP    prior to move to [place]--had another sleep study 10/2015 w/pulm Dr. Camillo Flaming.  . Osteoarthritis     "severe; progressing fast" (07/12/2013); multiple joints-not surgical candidate for TKR (03/2015).  Triamcinolon knee injections by Dr. Tessa Lerner 12/2017.  [name] Kitchen Pernicious anemia 08/24/2014  . Pleural plaque with presence of asbestos 07/22/2013  . Pulmonary embolism ([place]) 07/2013   Dx at [facility] with very small peripheral upper lobe pe 07/2013: pt took coumadin for about 8-9 mo  . Pyelonephritis    "several times over the last 30 yr" (07/12/2013)  . RBBB (right bundle branch block)   . Recurrent major depression ([place])   . Recurrent UTI    hx of hospitalization for pyelonephritis; started abx prophylaxis 06/2015  . Syncope    Hypotensive; ED visit--Dr. Terrence Dupont did Cath--nonobstructive CAD, EF 55-60%.  In retrospect, suspect pt rx med misuse/polypharmacy  . Tension headache, chronic    BP (!) 89/53 (BP Location: Right Arm)   Pulse 82   Temp 98.5 F (36.9 C)   Ht 5\' 4"  (1.626 m)   Wt 155 lb (70.3 kg)   SpO2 (!) 89%   BMI 26.61 kg/m   Opioid Risk Score:   Fall Risk Score:  `1  Depression screen PHQ 2/9  Depression screen [facility] 2/9 09/27/2019 02/24/2019 12/25/2018 09/02/2018 07/01/2018 05/27/2018 05/19/2018  Decreased Interest 1 1 1 1 1 1  0  Down, Depressed, Hopeless 2 1 1 1 1 1  0  PHQ - 2 Score 3 2 2 2 2 2  0  Altered sleeping 1 - - - - 0 -  Tired, decreased energy 1 - - - - 1 -  Change in appetite 0 - - - - 0 -  Feeling bad or failure about yourself  0 - - - - 0 -  Trouble concentrating 1 - - - - 0 -  Moving slowly or fidgety/restless 0 - - - - 0 -  Suicidal thoughts 0 - - - - 0 -  PHQ-9 Score 6 - - - - 3 -  Difficult doing work/chores Not difficult at all - - - -  Not difficult at all -  Some recent data might be hidden     Review of Systems  Constitutional: Positive for unexpected weight change.  HENT: Negative.   Eyes: Negative.   Respiratory: Positive for shortness of breath.   Cardiovascular: Negative.   Gastrointestinal: Positive for constipation and nausea.  Endocrine: Negative.    Genitourinary: Positive for difficulty urinating.  Musculoskeletal: Positive for arthralgias, gait problem and myalgias.  Skin: Negative.   Allergic/Immunologic: Negative.   Neurological: Positive for weakness.  Hematological: Negative.   Psychiatric/Behavioral: The patient is nervous/anxious.   All other systems reviewed and are negative.      Objective:   Physical Exam Vitals signs and nursing note reviewed.  Constitutional:      Appearance: Normal appearance.  Neck:     Musculoskeletal: Normal range of motion and neck supple.  Cardiovascular:     Rate and Rhythm: Normal rate.     Pulses: Normal pulses.     Heart sounds: Normal heart sounds.  Pulmonary:     Effort: Pulmonary effort is normal.     Breath sounds: Normal breath sounds.  Musculoskeletal:     Comments: Normal Muscle Bulk and Muscle Testing Reveals:  Upper Extremities: Full ROM and Muscle Strength 5/5 Bilateral AV Joint Tenderness  Thoracic Paraspinal Tenderness: T-1-T-3 Lumbar Paraspinal Tenderness: L-3-L-5 Lower Extremities: Decreased ROM and Muscle Strength 4/5 Bilateral Lower Extremities Flexion Produces Pain into Bilateral Patella's Transferred from Table to [facility] [employer]  Skin:    General: Skin is warm and dry.  Neurological:     Mental Status: She is alert and oriented to person, place, and time.  Psychiatric:        Mood and Affect: Mood normal.        Behavior: Behavior normal.           Assessment & Plan:  1. Functional deficits secondary to Gait disorder:Continue with HEP.10/04/2019 2. Chronic Back pain/ Lumbar Radiculitis/fibromyalgia /R>L Knee OA Pain Management:10/04/2019 Refilled: Oxycodone 10/325mg one tablet every 6 hours #100. ContinueVoltaren Gel. We will continue the opioid monitoring program, this consists of regular clinic visits, examinations, urine drug screen, pill counts as well as use of [place] Controlled Substance reporting System. 3. Depression with anxiety/Grief  reaction/Mood:10/04/2019 Continue Xanax,Trazodone and Cymbalta . PCP Following.  4. [name] with asthma:. Albuterol prn. :Pulmonology Following.10/04/2019 5. Bilateral Osteoarthritis of Bilateral Knees: S/PBilateralZilretta Injections on 07/22/2020with relief noted.Continue to Monitor. 10/04/2019 6. Fibromyalgia: Continue Home exercise regimen as tolerated. Continue to Monitor.10/04/2019 7. Cervicalgia/ Cervical Radiculitis:Continue HEP as tolerated. Alternate with heat and ice therapy. Continue to monitor.10/04/2019 8.Bilateral Greater Trochanteric Bursitis:Continue toAlternate Ice and Heat Therapy. Continue to Monitor.10/04/2019. 9. Chronic Bilateral Shoulder Pain:No complaints Today. Continue HEP as Tolerated. Continue current medication regimen. Continue to Monitor.10/04/2019. 10. Hypotensive: Orthostatic Blood Pressures obtain: Transferred to [facility] ED via EMS for Evaluation.  11. Oxygen Desaturation: Transferred to [facility] ED for Evaluation.  103minutes of face to face patient care time was spent during this visit. All questions were encouraged and answered.  F/U in 1 month

## 2019-10-04 NOTE — ED Notes (Signed)
Lab to add on magnesium  

## 2019-10-04 NOTE — ED Notes (Signed)
Per pt history of CHF pt given 500 cc fluid per EDP.

## 2019-10-04 NOTE — H&P (Signed)
[facility]                                                                                    Patient Demographics  Linda [name], is a 73 y.o. female  CSN: [id number]  MRN: [id number]  DOB - 16-Oct-1946  Admit Date - 10/04/2019  Outpatient Primary MD for the patient is McGowen, Adrian Blackwater, MD   With History of -  Past Medical History:  Diagnosis Date  . Acute upper GI bleed 06/2014   while pt taking coumadin, plavix, and meloxicam---despite being told not to take coumadin.  . Anginal pain ([place])    Nonobstructive CAD 2014; however, her cardiologist put her on a statin for this and NOT for hyperlipidemia per pt report.  Atyp CP 08/2017 at card f/u, plan for myoc perf imaging.  . Anxiety    panic attacks  . Asthma    w/ asbestososis   . BPPV (benign paroxysmal positional vertigo) 12/16/2012  . Chronic diastolic CHF (congestive heart failure) (HCC)    dry wt as of 11/06/16 is 168 lbs.  . Chronic lower back pain   . COPD (chronic obstructive pulmonary disease) ([place])   . DDD (degenerative disc disease)    lumbar and cervical.   . Diverticular disease   . Fibromyalgia    Patient states dx was around her late 10s but she had sx's for years prior to this.  . H/O hiatal hernia   . Hay fever   . History of pneumonia    hospitalized 12/2011, 02/2013, and 07/2013 [facility]) for this  . HTN (hypertension)    Renal artery dopplers 04/2013 neg for stenosis.  . Hypervitaminosis D 09/27/2019   over-supplemented.  Stopped vit D and plan recheck 2 mo.  . Idiopathic angio-edema-urticaria 72014   Angioedema component was very minimal  . Insomnia   . Iron deficiency anemia    Hematologist in [place], [place] did extensive w/u; no cause found; failed oral supplement;; gets fairly regular (q74m or so) IV iron infusions (Venofer -iron sucrose- 200mg  with procrit.  "for 14 yr I've been getting blood work q month & getting infusions prn" (07/12/2013).  Dr. Marin Olp locally, iron infusions  done, EPO deficiency dx'd  . Migraine syndrome    "not as often anymore; used to be ~ q wk" (07/12/2013)  . Mixed incontinence urge and stress   . Nephrolithiasis    "passed all on my own or they are still in there" (07/12/2013)  . Neuroleptic induced Parkinsonism ([place]) 2018   Dr. Carles Collet, neuro, saw her 11/24/17 and recommended d/c of abilify as first step.  D/c'd abilify and pt got complete recovery.  . OSA on CPAP    prior to move to [place]--had another sleep study 10/2015 w/pulm Dr. Camillo Flaming.  . Osteoarthritis    "severe; progressing fast" (07/12/2013); multiple joints-not surgical candidate for TKR (03/2015).  Triamcinolon knee injections by Dr. Tessa Lerner 12/2017.  [name] Kitchen Pernicious anemia 08/24/2014  . Pleural plaque with presence of asbestos 07/22/2013  . Pulmonary embolism ([place]) 07/2013   Dx at [facility] with very small peripheral upper lobe pe 07/2013: pt took coumadin for about 8-9 mo  . Pyelonephritis    "  several times over the last 30 yr" (07/12/2013)  . RBBB (right bundle branch block)   . Recurrent major depression ([place])   . Recurrent UTI    hx of hospitalization for pyelonephritis; started abx prophylaxis 06/2015  . Syncope    Hypotensive; ED visit--Dr. Terrence Dupont did Cath--nonobstructive CAD, EF 55-60%.  In retrospect, suspect pt rx med misuse/polypharmacy  . Tension headache, chronic       Past Surgical History:  Procedure Laterality Date  . APPENDECTOMY  1960  . AXILLARY SURGERY Left 1978   Multiple "lump" in armpit per pt  . CARDIAC CATHETERIZATION  01/2013   nonobstructive CAD, EF 55-60%  . CARDIOVASCULAR STRESS TEST  02/22/15   Low risk myocard perf imaging; wall motion normal, normal EF  . carotid duplex doppler  10/21/2017   R vertebral flow suggestive of possible distal obstruction.  Pt declined further w/u as of 10/29/17 but need to revisit this problem periodically.  . COCCYX REMOVAL  1972  . DEXA  06/05/2017   T-score -3.1  . DILATION AND CURETTAGE OF UTERUS  ? 1970's  .  ESOPHAGOGASTRODUODENOSCOPY N/A 07/19/2014   Gastritis found + in the setting of supratherapeutic INR, +plavix, + meloxicam.  . EYE SURGERY Left 2012-2013   "injections for ~ 1 yr; don't really know what for" (07/12/2013)  . HEEL SPUR SURGERY Left 2008  . KNEE SURGERY  2005  . LEFT HEART CATHETERIZATION WITH CORONARY ANGIOGRAM N/A 01/30/2013   Procedure: LEFT HEART CATHETERIZATION WITH CORONARY ANGIOGRAM;  Surgeon: Clent Demark, MD;  Location: [facility] CATH LAB;  Service: Cardiovascular;  Laterality: N/A;  . PLANTAR FASCIA RELEASE Left 2008  . SPIROMETRY  04/25/14   In hosp for acute asthma/COPD flare: mixed obstructive and restrictive lung disease. The FEV1 is severely reduced at 45% predicted.  FEV1 signif decreased compared to prior spirometry 07/23/13.  . TENDON RELEASE  1996   Right forearm and hand  . TOTAL ABDOMINAL HYSTERECTOMY  1974  . TRANSTHORACIC ECHOCARDIOGRAM  01/2013; 04/2014;08/2015; 09/2017   2014--NORMAL.  2015--focal basal septal hypertrophy, EF 55-60%, grade I diast dysfxn, mild LAE.  08/2015 EF 55-60%, nl LV syst fxn, grade I DD, valves wnl. 10/21/17: EF 65-70%, grd I DD, o/w normal.    in for   Chief Complaint  Patient presents with  . Hypotension  . Recurrent UTI     HPI  Linda [name]  is a 73 y.o. female, with past medical history significant for anxiety/fibromyalgia , CAD, history of GI bleed with history of diastolic congestive heart failure presenting today for general fatigue and weakness for the last 3 weeks gradually worsening.  She went to her primary medical doctor today where she was found to be hypotensive in the 17s. Patient denies any history of chest pain, fever or chills.  She gives a history of dysuria. Discussed with son who told me that she had similar episode 4 years ago where she was found to be iron deficient.  He reports that she had a good dinner last Friday. In the emergency room, the patient's blood pressure responded to IV hydration.  She was noted  to be hypokalemic, otherwise her blood work was basically normal.  She had general fatigue and reports decreased p.o. intake for the last few weeks.   Review of Systems    In addition to the HPI above,  No Fever-chills, No Headache, No changes with Vision or hearing, No problems swallowing food or Liquids, No Chest pain, Cough or Shortness of Breath,  No Abdominal pain, No Nausea or Vommitting, Bowel movements are regular, No Blood in stool or Urine, No dysuria, No new skin rashes or bruises, No new joints pains-aches,  No new weakness, tingling, numbness in any extremity, No recent weight gain or loss, No polyuria, polydypsia or polyphagia, No significant Mental Stressors.  All systems were reviewed and were negative.   Social History Social History   Tobacco Use  . Smoking status: Never Smoker  . Smokeless tobacco: Never Used  . Tobacco comment: never used tobacco  Substance Use Topics  . Alcohol use: No    Alcohol/week: 0.0 standard drinks     Family History Family History  Problem Relation Age of Onset  . Arthritis Mother   . Kidney disease Mother   . Heart disease Father   . Stroke Father   . Hypertension Father   . Diabetes Father   . Heart attack Father   . Heart attack Paternal Grandmother   . Diabetes Sister        one sister  . Hypertension Sister   . Hypertension Brother   . Multiple sclerosis Son      Prior to Admission medications   Medication Sig Start Date End Date Taking? Authorizing Provider  alendronate (FOSAMAX) 70 MG tablet Take 1 tablet (70 mg total) by mouth every 7 (seven) days. Take with a full glass of water on an empty stomach, first thing in the morning and remain up right for 30 minutes after taking. 07/26/19  Yes McGowen, Adrian Blackwater, MD  ALPRAZolam Duanne Moron) 1 MG tablet TAKE 1 TABLET THREE TIMES DAILY AS NEEDED FOR ANXIETY. Patient taking differently: Take 1 mg by mouth 3 (three) times daily as needed for anxiety.  04/05/19  Yes McGowen,  Adrian Blackwater, MD  aspirin 81 MG tablet Take 81 mg by mouth at bedtime.   Yes [provider]  diclofenac sodium (VOLTAREN) 1 % GEL Apply 2 g topically 3 (three) times daily as needed (pain). 11/19/16  Yes Bayard Hugger, NP  donepezil (ARICEPT) 10 MG tablet Take 1 tablet (10 mg total) by mouth at bedtime. 09/28/19  Yes McGowen, Adrian Blackwater, MD  DULoxetine (CYMBALTA) 30 MG capsule TAKE 1 CAPSULE ONCE DAILY ALONG WITH 60MG  CAPSULE FOR TOTAL 90MG  DAILY. Patient taking differently: Take 30 mg by mouth daily. Take with 60mg  to total 90mg  daily. 07/15/19  Yes McGowen, Adrian Blackwater, MD  DULoxetine (CYMBALTA) 60 MG capsule TAKE 1 CAPSULE A DAY TO BE COMBINED WITH 30MG  CAPSULE Patient taking differently: Take 60 mg by mouth daily. Take with 30mg  to total 90mg  daily. 07/27/19  Yes McGowen, Adrian Blackwater, MD  fluconazole (DIFLUCAN) 100 MG tablet 1 tab po qd prn thrush Patient taking differently: Take 100 mg by mouth daily as needed (Thrush).  07/26/19  Yes McGowen, Adrian Blackwater, MD  fluticasone (FLONASE) 50 MCG/ACT nasal spray Place 2 sprays into both nostrils daily. 09/27/19  Yes McGowen, Adrian Blackwater, MD  furosemide (LASIX) 40 MG tablet Take 1 tablet (40 mg total) by mouth daily. Patient taking differently: Take 40 mg by mouth daily as needed for fluid.  12/06/16  Yes Dhungel, Nishant, MD  ipratropium (ATROVENT) 0.03 % nasal spray Place 2 sprays into both nostrils every 12 (twelve) hours. 09/27/19  Yes McGowen, Adrian Blackwater, MD  isosorbide mononitrate (IMDUR) 30 MG 24 hr tablet TAKE 2 TABLETS IN THE AM AND 1 TABLET IN THE PM. Patient taking differently: Take 30-60 mg by mouth See admin instructions. Take 60mg  in the  AM and 30mg  in the PM. 09/28/19  Yes Crenshaw, Denice Bors, MD  lamoTRIgine (LAMICTAL) 150 MG tablet Take 1 tablet (150 mg total) by mouth daily. 09/27/19  Yes McGowen, Adrian Blackwater, MD  metoprolol succinate (TOPROL-XL) 50 MG 24 hr tablet TAKE (1) TABLET DAILY IN THE MORNING. Patient taking differently: Take 50 mg by mouth daily.   07/27/19  Yes McGowen, Adrian Blackwater, MD  minocycline (DYNACIN) 50 MG tablet Take 1 tablet (50 mg total) by mouth 2 (two) times daily. 09/27/19  Yes McGowen, Adrian Blackwater, MD  Multiple Vitamin (MULTIVITAMIN WITH MINERALS) TABS tablet Take 1 tablet by mouth daily.   Yes [provider]  nitroGLYCERIN (NITROSTAT) 0.4 MG SL tablet Place 1 tablet (0.4 mg total) under the tongue every 5 (five) minutes as needed for chest pain (x 3 doses). 09/23/19  Yes Lelon Perla, MD  ondansetron (ZOFRAN) 8 MG tablet Take 1 tablet (8 mg total) by mouth every 8 (eight) hours as needed for nausea or vomiting. 07/26/19  Yes McGowen, Adrian Blackwater, MD  oxyCODONE-acetaminophen (PERCOCET) 10-325 MG tablet Take 1 tablet by mouth every 6 (six) hours as needed for pain. 09/06/19  Yes Bayard Hugger, NP  pantoprazole (PROTONIX) 40 MG tablet TAKE 1 TABLET BY MOUTH DAILY. Patient taking differently: Take 40 mg by mouth daily.  09/01/19  Yes McGowen, Adrian Blackwater, MD  rosuvastatin (CRESTOR) 20 MG tablet Take 1 tablet (20 mg total) by mouth daily. 09/23/19 12/22/19 Yes Lelon Perla, MD  traZODone (DESYREL) 50 MG tablet TAKE 2-4 TABLETS AT BEDTIME Patient taking differently: Take 150 mg by mouth at bedtime as needed for sleep.  04/29/19  Yes McGowen, Adrian Blackwater, MD  Vitamin D, Ergocalciferol, (DRISDOL) 1.25 MG (50000 UT) CAPS capsule TAKE (1) CAPSULE TWICE A WEEK. Patient taking differently: Take 50,000 Units by mouth 2 (two) times a week.  07/27/19  Yes McGowen, Adrian Blackwater, MD    Allergies  Allergen Reactions  . Abilify [Aripiprazole] Other (See Comments)    parkinsonism  . Penicillins Itching, Swelling and Rash    Tolerated Cefepime in ED. Has patient had a PCN reaction causing immediate rash, facial/tongue/throat swelling, SOB or lightheadedness with hypotension: Yes Has patient had a PCN reaction causing severe rash involving mucus membranes or skin necrosis: No Has patient had a PCN reaction that required hospitalization: No  Has  patient had a PCN reaction occurring within the last 10 years: No     Physical Exam  Vitals  Blood pressure (!) 152/77, pulse 60, temperature 98.1 F (36.7 C), temperature source Oral, resp. rate 12, SpO2 100 %. General appearance in no acute distress HEENT no jaundice or pallor, no facial deviation oral thrush Neck supple, no neck vein distention Chest clear and resonant Heart normal S1-S2, no murmurs gallops or rubs Abdomen soft, nontender, bowel sounds present Extremities no clubbing cyanosis or edema Neuro grossly nonfocal, patient moving all extremities Skin no rashes or ulcers   Data Review  CBC Recent Labs  Lab 10/04/19 1124  WBC 7.1  HGB 12.9  HCT 39.4  PLT 197  MCV 92.5  MCH 30.3  MCHC 32.7  RDW 12.4  LYMPHSABS 2.3  MONOABS 0.7  EOSABS 0.2  BASOSABS 0.1   ------------------------------------------------------------------------------------------------------------------  Chemistries  Recent Labs  Lab 10/04/19 1124 10/04/19 1139  NA 137  --   K 3.1*  --   CL 99  --   CO2 25  --   GLUCOSE 129*  --   BUN 10  --  CREATININE 0.96  --   CALCIUM 8.6*  --   MG  --  2.0  AST 22  --   ALT 13  --   ALKPHOS 65  --   BILITOT 0.6  --    ------------------------------------------------------------------------------------------------------------------ estimated creatinine clearance is 50.2 mL/min (by C-G formula based on SCr of 0.96 mg/dL). ------------------------------------------------------------------------------------------------------------------ Recent Labs    10/04/19 1124  TSH 2.750     Coagulation profile Recent Labs  Lab 10/04/19 1124  INR 1.0   ------------------------------------------------------------------------------------------------------------------- No results for input(s): DDIMER in the last 72  hours. -------------------------------------------------------------------------------------------------------------------  Cardiac Enzymes No results for input(s): CKMB, TROPONINI, MYOGLOBIN in the last 168 hours.  Invalid input(s): CK ------------------------------------------------------------------------------------------------------------------ Invalid input(s): POCBNP   ---------------------------------------------------------------------------------------------------------------  Urinalysis    Component Value Date/Time   COLORURINE YELLOW 10/04/2019 [place] 10/04/2019 1305   LABSPEC 1.004 (L) 10/04/2019 1305   PHURINE 6.0 10/04/2019 1305   GLUCOSEU NEGATIVE 10/04/2019 1305   HGBUR NEGATIVE 10/04/2019 [place] 10/04/2019 1305   BILIRUBINUR Negative 08/07/2018 [place] 10/04/2019 1305   PROTEINUR NEGATIVE 10/04/2019 1305   UROBILINOGEN 0.2 08/07/2018 1437   UROBILINOGEN 1.0 10/16/2015 2009   NITRITE POSITIVE (A) 10/04/2019 1305   LEUKOCYTESUR NEGATIVE 10/04/2019 1305    ----------------------------------------------------------------------------------------------------------------     Imaging results:   Dg Chest Port 1 View  Result Date: 10/04/2019 CLINICAL DATA:  Weakness. Unsteady gait. EXAM: PORTABLE CHEST 1 VIEW COMPARISON:  07/23/2019 FINDINGS: Artifact overlies the chest. Heart size is normal. Chronic aortic atherosclerosis. Chronic calcified pleural plaques. No sign of active infiltrate, mass, effusion or collapse. No acute bone finding. IMPRESSION: 1. No active disease. Chronic calcified pleural plaques. 2. Aortic atherosclerosis. Electronically Signed   By: Nelson Chimes M.D.   On: 10/04/2019 11:50    My personal review of EKG: Rhythm NSR, at 69 bpm with right bundle branch block with no acute changes  Assessment & Plan  Generalized weakness and hypotension seems to be related to failure to thrive and  acute hypotension Will admit patient to continue IV fluids PT/OT evaluation  Dysuria with negative urinalysis Urine culture pending Continue with Rocephin  CAD, asymptomatic Monitor  Anxiety/fibromyalgia On significant number of medications,?  Polypharmacy  History of dementia Continue with Aricept   DVT Prophylaxis Lovenox  AM Labs Ordered, also please review Full Orders  Family Communication: D/W Son Gershon Mussel.  Code Status full  Disposition Plan: To be determined  Time spent in minutes : 43 minutes  Condition GUARDED   @SIGNATURE @

## 2019-10-04 NOTE — ED Triage Notes (Signed)
Patient presents to the ED by EMS with from her PCP for an annual visit. It was noted by the provider that the pt was weak appearing. Bp's in office 71/48 then 89/53. She reports Lethargy, weakness, foul odor to urine without blood or dysuria. Hx of UTI's. She is from [place] [employer]. Answers questions appropriately. NAD.

## 2019-10-04 NOTE — Progress Notes (Signed)
Notified bedside nurse of need to administer fluid bolus 2109 cc needed.

## 2019-10-04 NOTE — ED Provider Notes (Signed)
[facility] EMERGENCY DEPARTMENT Provider Note   CSN: [id number] Arrival date & time: 10/04/19  1043     History   Chief Complaint Chief Complaint  Patient presents with  . Hypotension  . Recurrent UTI    HPI Linda [name] is a 73 y.o. female.     Patient with multiple medical problems presents for low blood pressure and concern for possible urine infection.  Patient had episode of low blood pressure at primary care's office and sent over for further work-up.  On route patient's blood pressure ranged up to [id number] systolic and was in [id number] in the office.  Patient is felt general fatigue and general weakness and lethargy the past 3 weeks gradually worsening.  Patient denies any new medications.  Patient takes pain meds however is not had extra doses and no new blood pressure medications.  Patient has history of fibromyalgia and arthritis.  No fevers or chills.     Past Medical History:  Diagnosis Date  . Acute upper GI bleed 06/2014   while pt taking coumadin, plavix, and meloxicam---despite being told not to take coumadin.  . Anginal pain ([place])    Nonobstructive CAD 2014; however, her cardiologist put her on a statin for this and NOT for hyperlipidemia per pt report.  Atyp CP 08/2017 at card f/u, plan for myoc perf imaging.  . Anxiety    panic attacks  . Asthma    w/ asbestososis   . BPPV (benign paroxysmal positional vertigo) 12/16/2012  . Chronic diastolic CHF (congestive heart failure) (HCC)    dry wt as of 11/06/16 is 168 lbs.  . Chronic lower back pain   . COPD (chronic obstructive pulmonary disease) ([place])   . DDD (degenerative disc disease)    lumbar and cervical.   . Diverticular disease   . Fibromyalgia    Patient states dx was around her late 29s but she had sx's for years prior to this.  . H/O hiatal hernia   . Hay fever   . History of pneumonia    hospitalized 12/2011, 02/2013, and 07/2013 [facility]) for this  . HTN (hypertension)    Renal artery  dopplers 04/2013 neg for stenosis.  . Hypervitaminosis D 09/27/2019   over-supplemented.  Stopped vit D and plan recheck 2 mo.  . Idiopathic angio-edema-urticaria 72014   Angioedema component was very minimal  . Insomnia   . Iron deficiency anemia    Hematologist in [place], [place] did extensive w/u; no cause found; failed oral supplement;; gets fairly regular (q34m or so) IV iron infusions (Venofer -iron sucrose- 200mg  with procrit.  "for 14 yr I've been getting blood work q month & getting infusions prn" (07/12/2013).  Dr. Marin Olp locally, iron infusions done, EPO deficiency dx'd  . Migraine syndrome    "not as often anymore; used to be ~ q wk" (07/12/2013)  . Mixed incontinence urge and stress   . Nephrolithiasis    "passed all on my own or they are still in there" (07/12/2013)  . Neuroleptic induced Parkinsonism ([place]) 2018   Dr. Carles Collet, neuro, saw her 11/24/17 and recommended d/c of abilify as first step.  D/c'd abilify and pt got complete recovery.  . OSA on CPAP    prior to move to [place]--had another sleep study 10/2015 w/pulm Dr. Camillo Flaming.  . Osteoarthritis    "severe; progressing fast" (07/12/2013); multiple joints-not surgical candidate for TKR (03/2015).  Triamcinolon knee injections by Dr. Tessa Lerner 12/2017.  [name] Kitchen Pernicious anemia 08/24/2014  .  Pleural plaque with presence of asbestos 07/22/2013  . Pulmonary embolism ([place]) 07/2013   Dx at [facility] with very small peripheral upper lobe pe 07/2013: pt took coumadin for about 8-9 mo  . Pyelonephritis    "several times over the last 30 yr" (07/12/2013)  . RBBB (right bundle branch block)   . Recurrent major depression ([place])   . Recurrent UTI    hx of hospitalization for pyelonephritis; started abx prophylaxis 06/2015  . Syncope    Hypotensive; ED visit--Dr. Terrence Dupont did Cath--nonobstructive CAD, EF 55-60%.  In retrospect, suspect pt rx med misuse/polypharmacy  . Tension headache, chronic     Patient Active Problem List   Diagnosis Date Noted  . Hypotension  10/04/2019  . Asthma 01/21/2018  . COPD (chronic obstructive pulmonary disease) ([place]) 01/21/2018  . Fibromyalgia 01/21/2018  . Pulmonary asbestosis ([place]) 01/21/2018  . Influenza A 01/02/2018  . Chronic respiratory failure with hypoxia ([place]) 01/02/2018  . Hypomagnesemia 01/02/2018  . Acute bronchitis 01/02/2018  . Failure to thrive in adult 01/01/2018  . Altered mental status 10/21/2017  . Fall 10/21/2017  . Lumbar radiculitis 02/10/2017  . Dehydration 12/04/2016  . Low serum erythropoietin level 10/17/2016  . RBBB 09/23/2016  . Primary osteoarthritis of left knee 06/19/2016  . Debilitated patient 06/06/2016  . Acute on chronic diastolic CHF (congestive heart failure), NYHA class 3 ([place]) 05/30/2016  . SOB (shortness of breath)   . [name] ([place])   . Restrictive lung disease 02/07/2016  . Rotator cuff syndrome of right shoulder 01/10/2016  . UTI (urinary tract infection) 01/01/2016  . Encephalopathy, metabolic 99991111  . CAP (community acquired pneumonia) 01/01/2016  . Fall at home 01/01/2016  . Rhabdomyolysis 01/01/2016  . Diastolic heart failure ([place]) 10/16/2015  . CAD-minor 2014 08/16/2015  . Chest pain with moderate risk for cardiac etiology 08/16/2015  . Narrowing of intervertebral disc space 07/17/2015  . Bilateral lower leg pain 01/24/2015  . 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 Chronic pain syndrome 11/21/2014  . Anxiety and depression 11/21/2014  . Abnormal grief reaction 11/21/2014  . Severe persistent asthma 11/21/2014  . Hypokalemia 11/21/2014  . Hyperlipidemia 11/21/2014  . Primary osteoarthritis of right knee 10/18/2014  . Pernicious anemia 08/24/2014  . Generalized anxiety disorder--with occasional panic attacks.  08/05/2014  . Recurrent major depression-severe ([place]) 08/05/2014  . Multifactorial gait disorder 07/26/2014  . Epistaxis 07/18/2014  . Acute GI bleeding 07/17/2014  . Anemia associated with acute blood loss 07/17/2014  . Syncope  07/17/2014  . History of pulmonary embolism 07/17/2014  . GI bleed 07/17/2014  . Arthritis 05/11/2014  . DDD (degenerative disc disease) 05/11/2014  . Fibrositis 05/11/2014  . Amianthosis ([place]) 05/11/2014  . Grief 04/28/2014  . OSA (obstructive sleep apnea) 04/24/2014  . Chronic diastolic CHF, NYHA class 1 04/24/2014  . Acute pulmonary embolism ([place]) 08/13/2013  . Pulmonary embolism ([place]) 08/04/2013  . Pleural plaque with presence of asbestos 07/22/2013  . Polypharmacy 04/26/2013  . Fibromyalgia syndrome 03/01/2013  . Insomnia 11/12/2012  . HTN (hypertension), benign 10/25/2012    Past Surgical History:  Procedure Laterality Date  . APPENDECTOMY  1960  . AXILLARY SURGERY Left 1978   Multiple "lump" in armpit per pt  . CARDIAC CATHETERIZATION  01/2013   nonobstructive CAD, EF 55-60%  . CARDIOVASCULAR STRESS TEST  02/22/15   Low risk myocard perf imaging; wall motion normal, normal EF  . carotid duplex doppler  10/21/2017   R vertebral flow suggestive of possible distal obstruction.  Pt declined further w/u as of 10/29/17 but need to revisit this problem periodically.  . COCCYX REMOVAL  1972  . DEXA  06/05/2017   T-score -3.1  . DILATION AND CURETTAGE OF UTERUS  ? 1970's  . ESOPHAGOGASTRODUODENOSCOPY N/A 07/19/2014   Gastritis found + in the setting of supratherapeutic INR, +plavix, + meloxicam.  . EYE SURGERY Left 2012-2013   "injections for ~ 1 yr; don't really know what for" (07/12/2013)  . HEEL SPUR SURGERY Left 2008  . KNEE SURGERY  2005  . LEFT HEART CATHETERIZATION WITH CORONARY ANGIOGRAM N/A 01/30/2013   Procedure: LEFT HEART CATHETERIZATION WITH CORONARY ANGIOGRAM;  Surgeon: Clent Demark, MD;  Location: [facility] CATH LAB;  Service: Cardiovascular;  Laterality: N/A;  . PLANTAR FASCIA RELEASE Left 2008  . SPIROMETRY  04/25/14   In hosp for acute asthma/COPD flare: mixed obstructive and restrictive lung disease. The FEV1 is severely reduced at 45% predicted.  FEV1 signif decreased  compared to prior spirometry 07/23/13.  . TENDON RELEASE  1996   Right forearm and hand  . TOTAL ABDOMINAL HYSTERECTOMY  1974  . TRANSTHORACIC ECHOCARDIOGRAM  01/2013; 04/2014;08/2015; 09/2017   2014--NORMAL.  2015--focal basal septal hypertrophy, EF 55-60%, grade I diast dysfxn, mild LAE.  08/2015 EF 55-60%, nl LV syst fxn, grade I DD, valves wnl. 10/21/17: EF 65-70%, grd I DD, o/w normal.     OB History   No obstetric history on file.      Home Medications    Prior to Admission medications   Medication Sig Start Date End Date Taking? Authorizing Provider  alendronate (FOSAMAX) 70 MG tablet Take 1 tablet (70 mg total) by mouth every 7 (seven) days. Take with a full glass of water on an empty stomach, first thing in the morning and remain up right for 30 minutes after taking. 07/26/19  Yes McGowen, Adrian Blackwater, MD  ALPRAZolam Duanne Moron) 1 MG tablet TAKE 1 TABLET THREE TIMES DAILY AS NEEDED FOR ANXIETY. Patient taking differently: Take 1 mg by mouth 3 (three) times daily as needed for anxiety.  04/05/19  Yes McGowen, Adrian Blackwater, MD  aspirin 81 MG tablet Take 81 mg by mouth at bedtime.   Yes [provider]  diclofenac sodium (VOLTAREN) 1 % GEL Apply 2 g topically 3 (three) times daily as needed (pain). 11/19/16  Yes Bayard Hugger, NP  donepezil (ARICEPT) 10 MG tablet Take 1 tablet (10 mg total) by mouth at bedtime. 09/28/19  Yes McGowen, Adrian Blackwater, MD  DULoxetine (CYMBALTA) 30 MG capsule TAKE 1 CAPSULE ONCE DAILY ALONG WITH 60MG  CAPSULE FOR TOTAL 90MG  DAILY. Patient taking differently: Take 30 mg by mouth daily. Take with 60mg  to total 90mg  daily. 07/15/19  Yes McGowen, Adrian Blackwater, MD  DULoxetine (CYMBALTA) 60 MG capsule TAKE 1 CAPSULE A DAY TO BE COMBINED WITH 30MG  CAPSULE Patient taking differently: Take 60 mg by mouth daily. Take with 30mg  to total 90mg  daily. 07/27/19  Yes McGowen, Adrian Blackwater, MD  fluconazole (DIFLUCAN) 100 MG tablet 1 tab po qd prn thrush Patient taking differently: Take 100 mg by  mouth daily as needed (Thr[place]).  07/26/19  Yes McGowen, Adrian Blackwater, MD  fluticasone (FLONASE) 50 MCG/ACT nasal spray Place 2 sprays into both nostrils daily. 09/27/19  Yes McGowen, Adrian Blackwater, MD  furosemide (LASIX) 40 MG tablet Take 1 tablet (40 mg total) by mouth daily. Patient taking differently: Take 40 mg by mouth daily as needed for fluid.  12/06/16  Yes Dhungel, Flonnie Overman, MD  ipratropium (ATROVENT) 0.03 % nasal spray Place 2 sprays into both nostrils every 12 (twelve) hours. 09/27/19  Yes McGowen, Adrian Blackwater, MD  isosorbide mononitrate (IMDUR) 30 MG 24 hr tablet TAKE 2 TABLETS IN THE AM AND 1 TABLET IN THE PM. Patient taking differently: Take 30-60 mg by mouth See admin instructions. Take 60mg  in the AM and 30mg  in the PM. 09/28/19  Yes Crenshaw, Denice Bors, MD  lamoTRIgine (LAMICTAL) 150 MG tablet Take 1 tablet (150 mg total) by mouth daily. 09/27/19  Yes McGowen, Adrian Blackwater, MD  metoprolol succinate (TOPROL-XL) 50 MG 24 hr tablet TAKE (1) TABLET DAILY IN THE MORNING. Patient taking differently: Take 50 mg by mouth daily.  07/27/19  Yes McGowen, Adrian Blackwater, MD  minocycline (DYNACIN) 50 MG tablet Take 1 tablet (50 mg total) by mouth 2 (two) times daily. 09/27/19  Yes McGowen, Adrian Blackwater, MD  Multiple Vitamin (MULTIVITAMIN WITH MINERALS) TABS tablet Take 1 tablet by mouth daily.   Yes [provider]  nitroGLYCERIN (NITROSTAT) 0.4 MG SL tablet Place 1 tablet (0.4 mg total) under the tongue every 5 (five) minutes as needed for chest pain (x 3 doses). 09/23/19  Yes Lelon Perla, MD  ondansetron (ZOFRAN) 8 MG tablet Take 1 tablet (8 mg total) by mouth every 8 (eight) hours as needed for nausea or vomiting. 07/26/19  Yes McGowen, Adrian Blackwater, MD  oxyCODONE-acetaminophen (PERCOCET) 10-325 MG tablet Take 1 tablet by mouth every 6 (six) hours as needed for pain. 09/06/19  Yes Bayard Hugger, NP  pantoprazole (PROTONIX) 40 MG tablet TAKE 1 TABLET BY MOUTH DAILY. Patient taking differently: Take 40 mg by mouth daily.   09/01/19  Yes McGowen, Adrian Blackwater, MD  rosuvastatin (CRESTOR) 20 MG tablet Take 1 tablet (20 mg total) by mouth daily. 09/23/19 12/22/19 Yes Lelon Perla, MD  traZODone (DESYREL) 50 MG tablet TAKE 2-4 TABLETS AT BEDTIME Patient taking differently: Take 150 mg by mouth at bedtime as needed for sleep.  04/29/19  Yes McGowen, Adrian Blackwater, MD  Vitamin D, Ergocalciferol, (DRISDOL) 1.25 MG (50000 UT) CAPS capsule TAKE (1) CAPSULE TWICE A WEEK. Patient taking differently: Take 50,000 Units by mouth 2 (two) times a week.  07/27/19  Yes McGowen, Adrian Blackwater, MD    Family History Family History  Problem Relation Age of Onset  . Arthritis Mother   . Kidney disease Mother   . Heart disease Father   . Stroke Father   . Hypertension Father   . Diabetes Father   . Heart attack Father   . Heart attack Paternal Grandmother   . Diabetes Sister        one sister  . Hypertension Sister   . Hypertension Brother   . Multiple sclerosis Son     Social History Social History   Tobacco Use  . Smoking status: Never Smoker  . Smokeless tobacco: Never Used  . Tobacco comment: never used tobacco  Substance Use Topics  . Alcohol use: No    Alcohol/week: 0.0 standard drinks  . Drug use: No     Allergies   Abilify [aripiprazole] and Penicillins   Review of Systems Review of Systems  Constitutional: Positive for fatigue. Negative for chills and fever.  HENT: Negative for congestion.   Eyes: Negative for visual disturbance.  Respiratory: Negative for shortness of breath.   Cardiovascular: Negative for chest pain.  Gastrointestinal: Positive for nausea. Negative for abdominal pain and vomiting.  Genitourinary: Positive for frequency. Negative for dysuria and  flank pain.  Musculoskeletal: Negative for back pain, neck pain and neck stiffness.  Skin: Negative for rash.  Neurological: Positive for weakness and light-headedness. Negative for headaches.     Physical Exam Updated Vital Signs BP 135/90    Pulse (!) 55   Temp 98.1 F (36.7 C) (Oral)   Resp 18   SpO2 94%   Physical Exam Vitals signs and nursing note reviewed.  Constitutional:      Appearance: She is well-developed.  HENT:     Head: Normocephalic and atraumatic.     Mouth/Throat:     Mouth: Mucous membranes are dry.  Eyes:     General:        Right eye: No discharge.        Left eye: No discharge.     Conjunctiva/sclera: Conjunctivae normal.  Neck:     Musculoskeletal: Normal range of motion and neck supple.     Trachea: No tracheal deviation.  Cardiovascular:     Rate and Rhythm: Normal rate and regular rhythm.  Pulmonary:     Effort: Pulmonary effort is normal.     Breath sounds: Normal breath sounds.  Abdominal:     General: There is no distension.     Palpations: Abdomen is soft.     Tenderness: There is no abdominal tenderness. There is no guarding.  Skin:    General: Skin is warm.     Coloration: Skin is pale.     Findings: No rash.  Neurological:     Mental Status: She is alert and oriented to person, place, and time.     Comments: General weakness and fatigue on exam.  Moves all extremities equal bilateral.  Pupils equal.  Psychiatric:        Thought Content: Thought content normal.      ED Treatments / Results  Labs (all labs ordered are listed, but only abnormal results are displayed) Labs Reviewed  COMPREHENSIVE METABOLIC PANEL - Abnormal; Notable for the following components:      Result Value   Potassium 3.1 (*)    Glucose, Bld 129 (*)    Calcium 8.6 (*)    Total Protein 6.3 (*)    GFR calc non Af Amer 59 (*)    All other components within normal limits  URINALYSIS, ROUTINE W REFLEX MICROSCOPIC - Abnormal; Notable for the following components:   Specific Gravity, Urine 1.004 (*)    Nitrite POSITIVE (*)    Bacteria, UA RARE (*)    All other components within normal limits  CULTURE, BLOOD (ROUTINE X 2)  CULTURE, BLOOD (ROUTINE X 2)  URINE CULTURE  SARS CORONAVIRUS 2 (TAT 6-24  HRS)  LACTIC ACID, PLASMA  LACTIC ACID, PLASMA  CBC WITH DIFFERENTIAL/PLATELET  PROTIME-INR  TSH  MAGNESIUM  C-REACTIVE PROTEIN    EKG EKG Interpretation  Date/Time:  Monday October 04 2019 11:05:23 EDT Ventricular Rate:  69 PR Interval:    QRS Duration: 158 QT Interval:  458 QTC Calculation: 491 R Axis:   41 Text Interpretation:  Sinus rhythm Right bundle branch block similar previous Confirmed by Elnora Morrison [phone]) on 10/04/2019 11:25:11 AM   Radiology Dg Chest Port 1 View  Result Date: 10/04/2019 CLINICAL DATA:  Weakness. Unsteady gait. EXAM: PORTABLE CHEST 1 VIEW COMPARISON:  07/23/2019 FINDINGS: Artifact overlies the chest. Heart size is normal. Chronic aortic atherosclerosis. Chronic calcified pleural plaques. No sign of active infiltrate, mass, effusion or collapse. No acute bone finding. IMPRESSION: 1. No active disease. Chronic calcified  pleural plaques. 2. Aortic atherosclerosis. Electronically Signed   By: Nelson Chimes M.D.   On: 10/04/2019 11:50    Procedures .Critical Care Performed by: Elnora Morrison, MD Authorized by: Elnora Morrison, MD   Critical care provider statement:    Critical care time (minutes):  40   Critical care start time:  10/04/2019 12:00 PM   Critical care end time:  10/04/2019 12:40 PM   Critical care time was exclusive of:  Separately billable procedures and treating other patients and teaching time   Critical care was necessary to treat or prevent imminent or life-threatening deterioration of the following conditions:  Sepsis   Critical care was time spent personally by me on the following activities:  Evaluation of patient's response to treatment, examination of patient, ordering and performing treatments and interventions, ordering and review of laboratory studies, ordering and review of radiographic studies, pulse oximetry, re-evaluation of patient's condition, obtaining history from patient or surrogate and review of old charts    (including critical care time)  Medications Ordered in ED Medications  fentaNYL (SUBLIMAZE) injection 30 mcg (has no administration in time range)  sodium chloride 0.9 % bolus 500 mL (has no administration in time range)  sodium chloride 0.9 % bolus 500 mL (0 mLs Intravenous Stopped 10/04/19 1335)  potassium chloride SA (KLOR-CON) CR tablet 40 mEq (40 mEq Oral Given 10/04/19 1320)  cefTRIAXone (ROCEPHIN) 1 g in sodium chloride 0.9 % 100 mL IVPB (0 g Intravenous Stopped 10/04/19 1425)     Initial Impression / Assessment and Plan / ED Course  I have reviewed the triage vital signs and the nursing notes.  Pertinent labs & imaging results that were available during my care of the patient were reviewed by me and considered in my medical decision making (see chart for details).       Patient presents with general weakness and fatigue symptoms with low blood pressure in the office.  Patient has had decreased appetite and symptoms worsening this week.  Concern clinically for dehydration, possible early sepsis from UTI, electrolyte/kidney abnormalities, anemia and other differentials.  Sepsis screening ordered lactate, cultures, start with small IV fluid bolus with cardiac history.  No fever documented yet.  [place] test will be added.  Chest x-ray pending. Patient's blood pressure improved with small IV fluid bolus, lactate is normal, no fever.  Concern for infection and dehydration however not concern for sepsis or septic shock at this time.  Patient will be continue to be monitored, repeat IV fluids and admission. Delay in UA, Rocephin ordered.   Sepsis - Repeat Assessment  Performed at:    3 pm  Vitals     Blood pressure 135/90, pulse (!) 55, temperature 98.1 F (36.7 C), temperature source Oral, resp. rate 18, SpO2 94 %.  Heart:     Regular rate and rhythm  Lungs:    CTA  Capillary Refill:   <2 sec  Peripheral Pulse:   Radial pulse palpable  Skin:     Normal Color    Final  Clinical Impressions(s) / ED Diagnoses   Final diagnoses:  Transient hypotension  General weakness  Hypokalemia    ED Discharge Orders    None       Elnora Morrison, MD 10/04/19 1650

## 2019-10-05 DIAGNOSIS — N390 Urinary tract infection, site not specified: Secondary | ICD-10-CM

## 2019-10-05 DIAGNOSIS — J439 Emphysema, unspecified: Secondary | ICD-10-CM

## 2019-10-05 LAB — BASIC METABOLIC PANEL
Anion gap: 11 (ref 5–15)
BUN: 7 mg/dL — ABNORMAL LOW (ref 8–23)
CO2: 26 mmol/L (ref 22–32)
Calcium: 8.7 mg/dL — ABNORMAL LOW (ref 8.9–10.3)
Chloride: 105 mmol/L (ref 98–111)
Creatinine, Ser: 0.79 mg/dL (ref 0.44–1.00)
GFR calc Af Amer: >60 mL/min (ref >60)
GFR calc non Af Amer: >60 mL/min (ref >60)
Glucose, Bld: 99 mg/dL (ref 70–99)
Potassium: 3.8 mmol/L (ref 3.5–5.1)
Sodium: 142 mmol/L (ref 135–145)

## 2019-10-05 LAB — CK: Total CK: 80 U/L (ref 38–234)

## 2019-10-05 MED ORDER — SODIUM CHLORIDE 0.9 % IV SOLN: 1.0000 g | INTRAVENOUS | Status: DC

## 2019-10-05 MED ORDER — SODIUM CHLORIDE 0.9 % IV SOLN
1.0000 g | INTRAVENOUS | Status: DC
  Administered 2019-10-05: 1 g via INTRAVENOUS
  Filled 2019-10-05: qty 1
  Filled 2019-10-05: qty 10

## 2019-10-05 NOTE — Progress Notes (Signed)
PROGRESS NOTE    Linda [name]  [id number] DOB: 08/18/1946 DOA: 10/04/2019 PCP: Tammi Sou, MD     Brief Narrative:  73 year old lady with prior history of hypertension, asthma, dementia, iron deficiency anemia, COPD, chronic diastolic heart failure, anxiety and depression presents to ED weakness and fatigue for 3 weeks.  She was seen at PCP office where she was found to be hypotensive with blood pressure 70s over 40s.  She was referred to ED and admitted for further evaluation.  Labs revealed positive urinary tract infection and she was admitted to [facility] service for further evaluation.   10/13 patient seen and examined today she reports generalized body aches and weakness and pain all over.  She denies any nausea vomiting diarrhea.  She denies any chest pain or shortness of breath.    Assessment & Plan:   Active Problems:   HTN (hypertension), benign   History of pulmonary embolism   Generalized anxiety disorder--with occasional panic attacks.    Diastolic heart failure (HCC)   UTI (urinary tract infection)   COPD (chronic obstructive pulmonary disease) (HCC)   Hypotension  Generalized weakness and fatigue secondary to failure to thrive and hypotension and urinary tract infection But the patient on IV fluids and IV antibiotics and follow urine and blood cultures.  Get CK levels and recommend PT and OT evaluation. Check orthostatic vital signs tomorrow Lactic acid and CRP are within normal limits. Patient is afebrile and WBC count within normal limits.    History of dementia Patient is alert and answering all questions appropriately Continue with Aricept   History of chronic diastolic heart failure She appears to be compensated. Echocardiogram in 99991111 showed Systolic function was vigorous. The estimated ejection fraction was in the range of 65%   to 70%. Wall motion was normal; there were no regional wall   motion abnormalities. Doppler parameters are  consistent with   abnormal left ventricular relaxation (grade 1 diastolic   dysfunction).     Hypokalemia Replaced    Anxiety and depression continue with Cymbalta, Xanax and Lamictal. No suicidal or homicidal ideations   Hyperlipidemia Continue with Crestor 20 mg daily.   COPD and asthma,  No wheezing heard, resume bronchodilators as needed.   DVT prophylaxis: Lovenox.  Code Status: full code.  Family Communication: none at bedside.  Disposition Plan: pending clinical improvement and urine culture report.    Consultants:   None.    Procedures: none.   Antimicrobials:  Rocephin since admission.   Subjective:  Pt reports body aches all over, , some abdominal discomfort, no nausea or vomiting.   Objective: Vitals:   10/04/19 2248 10/05/19 0008 10/05/19 0446 10/05/19 0954  BP:  131/61 (!) 152/68   Pulse:  64 62   Resp:   18   Temp:   98.2 F (36.8 C)   TempSrc:   Oral   SpO2:   94% 94%  Weight: 67.5 kg  69.4 kg   Height: 5\' 4"  (1.626 m)       Intake/Output Summary (Last 24 hours) at 10/05/2019 1207 Last data filed at 10/05/2019 1004 Gross per 24 hour  Intake 600 ml  Output 500 ml  Net 100 ml   Filed Weights   10/04/19 2248 10/05/19 0446  Weight: 67.5 kg 69.4 kg    Examination:  General exam: Appears calm and comfortable  Respiratory system: Clear to auscultation. Respiratory effort normal. Cardiovascular system: S1 & S2 heard, RRR. No JVD, No pedal edema. Gastrointestinal system:  Abdomen is soft, mild generalized tenderness, bowel sounds good.  Central nervous system: Alert and oriented. No focal neurological deficits. Extremities: Symmetric 5 x 5 power. Skin: No rashes, lesions or ulcers Psychiatry: Mood & affect appropriate.     Data Reviewed: I have personally reviewed following labs and imaging studies  CBC: Recent Labs  Lab 10/04/19 1124  WBC 7.1  NEUTROABS 3.9  HGB 12.9  HCT 39.4  MCV 92.5  PLT [id number]   Basic Metabolic  Panel: Recent Labs  Lab 10/04/19 1124 10/04/19 1139 10/05/19 0401  NA 137  --  142  K 3.1*  --  3.8  CL 99  --  105  CO2 25  --  26  GLUCOSE 129*  --  99  BUN 10  --  7*  CREATININE 0.96  --  0.79  CALCIUM 8.6*  --  8.7*  MG  --  2.0  --    GFR: Estimated Creatinine Clearance: 59.9 mL/min (by C-G formula based on SCr of 0.79 mg/dL). Liver Function Tests: Recent Labs  Lab 10/04/19 1124  AST 22  ALT 13  ALKPHOS 65  BILITOT 0.6  PROT 6.3*  ALBUMIN 3.5   No results for input(s): LIPASE, AMYLASE in the last 168 hours. No results for input(s): AMMONIA in the last 168 hours. Coagulation Profile: Recent Labs  Lab 10/04/19 1124  INR 1.0   Cardiac Enzymes: No results for input(s): CKTOTAL, CKMB, CKMBINDEX, TROPONINI in the last 168 hours. BNP (last 3 results) No results for input(s): PROBNP in the last 8760 hours. HbA1C: No results for input(s): HGBA1C in the last 72 hours. CBG: No results for input(s): GLUCAP in the last 168 hours. Lipid Profile: No results for input(s): CHOL, HDL, LDLCALC, TRIG, CHOLHDL, LDLDIRECT in the last 72 hours. Thyroid Function Tests: Recent Labs    10/04/19 1124  TSH 2.750   Anemia Panel: No results for input(s): VITAMINB12, FOLATE, FERRITIN, TIBC, IRON, RETICCTPCT in the last 72 hours. Sepsis Labs: Recent Labs  Lab 10/04/19 1124 10/04/19 1242  LATICACIDVEN 1.9 1.2    Recent Results (from the past 240 hour(s))  SARS CORONAVIRUS 2 (TAT 6-24 HRS) Nasopharyngeal Nasopharyngeal Swab     Status: None   Collection Time: 10/04/19 12:00 PM   Specimen: Nasopharyngeal Swab  Result Value Ref Range Status   SARS Coronavirus 2 NEGATIVE NEGATIVE Final    Comment: (NOTE) SARS-CoV-2 target nucleic acids are NOT DETECTED. The SARS-CoV-2 RNA is generally detectable in upper and lower respiratory specimens during the acute phase of infection. Negative results do not preclude SARS-CoV-2 infection, do not rule out co-infections with other  pathogens, and should not be used as the sole basis for treatment or other patient management decisions. Negative results must be combined with clinical observations, patient history, and epidemiological information. The expected result is Negative. Fact Sheet for Patients: SugarRoll.be Fact Sheet for Healthcare Providers: https://www.woods-mathews.com/ This test is not yet approved or cleared by the [place] FDA and  has been authorized for detection and/or diagnosis of SARS-CoV-2 by FDA under an Emergency Use Authorization (EUA). This EUA will remain  in effect (meaning this test can be used) for the duration of the COVID-19 declaration under Section 56 4(b)(1) of the Act, 21 U.S.C. section 360bbb-3(b)(1), unless the authorization is terminated or revoked sooner. Performed at [facility], [place] [address], [place], [place] [address]          Radiology Studies: Dg Chest Port 1 View  Result Date: 10/04/2019  CLINICAL DATA:  Weakness. Unsteady gait. EXAM: PORTABLE CHEST 1 VIEW COMPARISON:  07/23/2019 FINDINGS: Artifact overlies the chest. Heart size is normal. Chronic aortic atherosclerosis. Chronic calcified pleural plaques. No sign of active infiltrate, mass, effusion or collapse. No acute bone finding. IMPRESSION: 1. No active disease. Chronic calcified pleural plaques. 2. Aortic atherosclerosis. Electronically Signed   By: Nelson Chimes M.D.   On: 10/04/2019 11:50        Scheduled Meds: . aspirin  81 mg Oral QHS  . donepezil  10 mg Oral QHS  . DULoxetine  30 mg Oral Daily  . DULoxetine  60 mg Oral Daily  . enoxaparin (LOVENOX) injection  40 mg Subcutaneous Q24H  . fluticasone  2 spray Each Nare Daily  . ipratropium  2 spray Each Nare BID  . lamoTRIgine  150 mg Oral Daily  . metoprolol succinate  50 mg Oral Daily  . pantoprazole  40 mg Oral Daily  . rosuvastatin  20 mg Oral Daily  . traZODone  100 mg Oral QHS    Continuous Infusions: . cefTRIAXone (ROCEPHIN)  IV    . dextrose 5 % and 0.45% NaCl 50 mL/hr at 10/04/19 2123     LOS: 1 day        Hosie Poisson, MD [facility] Pager [phone]   If 7PM-7AM, please contact night-coverage [email or web address] Password [email or web address] 10/05/2019, 12:07 PM

## 2019-10-05 NOTE — Evaluation (Signed)
Physical Therapy Evaluation Patient Details Name: Linda [name] MRN: [id number] DOB: 1946/11/23 Today's Date: 10/05/2019   History of Present Illness  73 year old female admitted with weakness, hypotension, general fatigue. PMH CAD, COPD, HF, HTN  Clinical Impression  Patient received in bed, appears anxious, uncomfortable. Reports chronic pain issues in back and B knees. Feeling a little better, but still weak. Patient performs bed mobility with min guard, increased time and effort. Performed transfer with min assist and was able to walk to door and back to bed with hand held assist and SPC. Labored ambulation due to B knee pain. One LOB requiring min assist to regain. Patient will benefit from continued skilled PT while here to improve strength and mobility to hopefully return to independent living with HHPT upon discharge. If she does not progress much she will need SNF.     Follow Up Recommendations Home health PT    Equipment Recommendations  None recommended by PT    Recommendations for Other Services       Precautions / Restrictions Precautions Precautions: Fall Restrictions Weight Bearing Restrictions: No      Mobility  Bed Mobility Overal bed mobility: Modified Independent             General bed mobility comments: increased time, and effort  Transfers Overall transfer level: Needs assistance Equipment used: Straight cane Transfers: Sit to/from Stand Sit to Stand: Min assist         General transfer comment: intial unsteadiness  Ambulation/Gait Ambulation/Gait assistance: Min assist Gait Distance (Feet): 20 Feet Assistive device: Straight cane Gait Pattern/deviations: Step-to pattern;Antalgic;Decreased stride length;Decreased step length - right;Decreased step length - left Gait velocity: decreased   General Gait Details: patient has pain in B knees that is chronic. She is due for shot in knees. Patient ambulates with slow labored pace  Stairs            Wheelchair Mobility    Modified Rankin (Stroke Patients Only)       Balance Overall balance assessment: Needs assistance Sitting-balance support: Feet supported Sitting balance-Leahy Scale: Good     Standing balance support: Bilateral upper extremity supported Standing balance-Leahy Scale: Fair Standing balance comment: would benefit from RW- has one at home                             Pertinent Vitals/Pain      Home Living Family/patient expects to be discharged to:: Private residence Living Arrangements: Alone Available Help at Discharge: Available PRN/intermittently;Friend(s) Type of Home: Independent living facility       Home Layout: One level Home Equipment: [place] - single point;Wheelchair - manual;Shower seat;Hand held Tourist information centre manager - 4 wheels      Prior Function                 Hand Dominance        Extremity/Trunk Assessment   Upper Extremity Assessment Upper Extremity Assessment: Generalized weakness    Lower Extremity Assessment Lower Extremity Assessment: Generalized weakness    Cervical / Trunk Assessment Cervical / Trunk Assessment: Normal  Communication   Communication: No difficulties  Cognition Arousal/Alertness: Awake/alert Behavior During Therapy: WFL for tasks assessed/performed Overall Cognitive Status: Within Functional Limits for tasks assessed  General Comments      Exercises     Assessment/Plan    PT Assessment Patient needs continued PT services  PT Problem List Decreased strength;Decreased mobility;Decreased activity tolerance;Decreased balance;Pain       PT Treatment Interventions Therapeutic activities;Gait training;Therapeutic exercise;Functional mobility training;Balance training;Patient/family education    PT Goals (Current goals can be found in the Care Plan section)  Acute Rehab PT Goals Patient Stated Goal: to  hopefully go back home, get stronger PT Goal Formulation: With patient Time For Goal Achievement: 10/12/19 Potential to Achieve Goals: Fair    Frequency Min 3X/week   Barriers to discharge Decreased caregiver support lives at independent living    Co-evaluation               AM-PAC PT "6 Clicks" Mobility  Outcome Measure Help needed turning from your back to your side while in a flat bed without using bedrails?: None Help needed moving from lying on your back to sitting on the side of a flat bed without using bedrails?: A Little Help needed moving to and from a bed to a chair (including a wheelchair)?: A Little Help needed standing up from a chair using your arms (e.g., wheelchair or bedside chair)?: A Little Help needed to walk in hospital room?: A Lot Help needed climbing 3-5 steps with a railing? : A Lot 6 Click Score: 17    End of Session Equipment Utilized During Treatment: Gait belt Activity Tolerance: Patient limited by pain;Patient limited by fatigue Patient left: in bed;with bed alarm set;with call bell/phone within reach;with nursing/sitter in room Nurse Communication: Mobility status PT Visit Diagnosis: Unsteadiness on feet (R26.81);Muscle weakness (generalized) (M62.81);Difficulty in walking, not elsewhere classified (R26.2);Pain Pain - Right/Left: (bilateral knees with walking, low back- chronic pain)    Time: [id number] PT Time Calculation (min) (ACUTE ONLY): 19 min   Charges:   PT Evaluation $PT Eval Moderate Complexity: 1 Mod          Charonda Hefter, PT, GCS 10/05/19,11:17 AM

## 2019-10-05 NOTE — Social Work (Signed)
CSW acknowledging consult for SNF placement. Will follow for therapy recommendations needed to best determine disposition/for insurance authorization.   Sabella Traore, MSW, LCSWA [place] Clinical Social Work [phone]   

## 2019-10-05 NOTE — TOC Initial Note (Signed)
Transition of Care [facility]) - Initial/Assessment Note    Patient Details  Name: Linda [name] MRN: [id number] Date of Birth: [birth year]  Transition of Care [facility]) CM/SW Contact:    Marilu Favre, RN Phone Number: 10/05/2019, 3:54 PM  Clinical Narrative:                  Patient from independent living [facility]. Patient has cane already . Discussed home health PT at length, patient does not feel that she needs HHPT at this time. Offered to call son, patient declined. Expected Discharge Plan: Home/Self Care Barriers to Discharge: Continued Medical Work up   Patient Goals and CMS Choice Patient states their goals for this hospitalization and ongoing recovery are:: to go home CMS Medicare.gov Compare Post Acute Care list provided to:: Patient Choice offered to / list presented to : Patient  Expected Discharge Plan and Services Expected Discharge Plan: Home/Self Care   Discharge Planning Services: CM Consult Post Acute Care Choice: [place] arrangements for the past 2 months: Apartment                 DME Arranged: N/A         HH Arranged: Patient Refused HH          Prior Living Arrangements/Services Living arrangements for the past 2 months: Apartment Lives with:: Self   Do you feel safe going back to the place where you live?: Yes            Criminal Activity/Legal Involvement Pertinent to Current Situation/Hospitalization: No - Comment as needed  Activities of Daily Living Home Assistive Devices/Equipment: Cane (specify quad or straight), Dentures (specify type), Blood pressure cuff, Shower chair with back, Built-in shower seat, Eyeglasses, Scales ADL Screening (condition at time of admission) Patient's cognitive ability adequate to safely complete daily activities?: Yes Is the patient deaf or have difficulty hearing?: No Does the patient have difficulty seeing, even when wearing glasses/contacts?: No Does the patient have difficulty  concentrating, remembering, or making decisions?: Yes Patient able to express need for assistance with ADLs?: Yes Does the patient have difficulty dressing or bathing?: No Independently performs ADLs?: Yes (appropriate for developmental age) Does the patient have difficulty walking or climbing stairs?: Yes Weakness of Legs: Both Weakness of Arms/Hands: None  Permission Sought/Granted   Permission granted to share information with : No              Emotional Assessment Appearance:: Appears stated age Attitude/Demeanor/Rapport: Engaged Affect (typically observed): Accepting Orientation: : Oriented to Situation, Oriented to  Time, Oriented to Place, Oriented to Self Alcohol / Substance Use: Not Applicable Psych Involvement: No (comment)  Admission diagnosis:  Hypokalemia [E87.6] General weakness [R53.1] Transient hypotension [I95.9] Hypotension [I95.9] Patient Active Problem List   Diagnosis Date Noted  . Hypotension 10/04/2019  . Asthma 01/21/2018  . COPD (chronic obstructive pulmonary disease) ([place]) 01/21/2018  . Fibromyalgia 01/21/2018  . Pulmonary asbestosis ([place]) 01/21/2018  . Influenza A 01/02/2018  . Chronic respiratory failure with hypoxia ([place]) 01/02/2018  . Hypomagnesemia 01/02/2018  . Acute bronchitis 01/02/2018  . Failure to thrive in adult 01/01/2018  . Altered mental status 10/21/2017  . Fall 10/21/2017  . Lumbar radiculitis 02/10/2017  . Dehydration 12/04/2016  . Low serum erythropoietin level 10/17/2016  . RBBB 09/23/2016  . Primary osteoarthritis of left knee 06/19/2016  . Debilitated patient 06/06/2016  . Acute on chronic diastolic CHF (congestive heart failure), NYHA class 3 ([place]) 05/30/2016  . SOB (  shortness of breath)   . [name] ([place])   . Restrictive lung disease 02/07/2016  . Rotator cuff syndrome of right shoulder 01/10/2016  . UTI (urinary tract infection) 01/01/2016  . Encephalopathy, metabolic 99991111  . CAP (community acquired  pneumonia) 01/01/2016  . Fall at home 01/01/2016  . Rhabdomyolysis 01/01/2016  . Diastolic heart failure ([place]) 10/16/2015  . CAD-minor 2014 08/16/2015  . Chest pain with moderate risk for cardiac etiology 08/16/2015  . Narrowing of intervertebral disc space 07/17/2015  . Bilateral lower leg pain 01/24/2015  . 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 Chronic pain syndrome 11/21/2014  . Anxiety and depression 11/21/2014  . Abnormal grief reaction 11/21/2014  . Severe persistent asthma 11/21/2014  . Hypokalemia 11/21/2014  . Hyperlipidemia 11/21/2014  . Primary osteoarthritis of right knee 10/18/2014  . Pernicious anemia 08/24/2014  . Generalized anxiety disorder--with occasional panic attacks.  08/05/2014  . Recurrent major depression-severe ([place]) 08/05/2014  . Multifactorial gait disorder 07/26/2014  . Epistaxis 07/18/2014  . Acute GI bleeding 07/17/2014  . Anemia associated with acute blood loss 07/17/2014  . Syncope 07/17/2014  . History of pulmonary embolism 07/17/2014  . GI bleed 07/17/2014  . Arthritis 05/11/2014  . DDD (degenerative disc disease) 05/11/2014  . Fibrositis 05/11/2014  . Amianthosis ([place]) 05/11/2014  . Grief 04/28/2014  . OSA (obstructive sleep apnea) 04/24/2014  . Chronic diastolic CHF, NYHA class 1 04/24/2014  . Acute pulmonary embolism ([place]) 08/13/2013  . Pulmonary embolism ([place]) 08/04/2013  . Pleural plaque with presence of asbestos 07/22/2013  . Polypharmacy 04/26/2013  . Fibromyalgia syndrome 03/01/2013  . Insomnia 11/12/2012  . HTN (hypertension), benign 10/25/2012   PCP:  Tammi Sou, MD Pharmacy:   [facility], [place] [address] Phone: [phone] Fax: [phone]     Social Determinants of Health (SDOH) Interventions    Readmission Risk Interventions No flowsheet data found.

## 2019-10-05 NOTE — Plan of Care (Signed)
  Problem: Clinical Measurements: Goal: Cardiovascular complication will be avoided Outcome: Progressing   Problem: Activity: Goal: Risk for activity intolerance will decrease Outcome: Progressing   Problem: Nutrition: Goal: Adequate nutrition will be maintained Outcome: Progressing   Problem: Pain Managment: Goal: General experience of comfort will improve Outcome: Progressing   Problem: Safety: Goal: Ability to remain free from injury will improve Outcome: Progressing   Problem: Skin Integrity: Goal: Risk for impaired skin integrity will decrease Outcome: Progressing

## 2019-10-06 DIAGNOSIS — F411 Generalized anxiety disorder: Secondary | ICD-10-CM

## 2019-10-06 DIAGNOSIS — I5032 Chronic diastolic (congestive) heart failure: Secondary | ICD-10-CM

## 2019-10-06 LAB — URINE CULTURE: Culture: >=100000 — AB

## 2019-10-06 MED ORDER — CEPHALEXIN 500 MG PO CAPS
500.0000 mg | ORAL_CAPSULE | Freq: Two times a day (BID) | ORAL | Status: DC
  Administered 2019-10-06 – 2019-10-07 (×2): 500 mg via ORAL
  Filled 2019-10-06 (×2): qty 1

## 2019-10-06 MED ORDER — OXYCODONE-ACETAMINOPHEN 5-325 MG PO TABS
1.0000 | ORAL_TABLET | Freq: Four times a day (QID) | ORAL | Status: DC | PRN
  Administered 2019-10-06 – 2019-10-07 (×3): 1 via ORAL
  Filled 2019-10-06 (×3): qty 1

## 2019-10-06 MED ORDER — LIP MEDEX EX OINT
TOPICAL_OINTMENT | CUTANEOUS | Status: DC | PRN
  Administered 2019-10-06: 1 via TOPICAL
  Filled 2019-10-06: qty 7

## 2019-10-06 MED ORDER — OXYCODONE HCL 5 MG PO TABS
5.0000 mg | ORAL_TABLET | Freq: Four times a day (QID) | ORAL | Status: DC | PRN
  Administered 2019-10-06 – 2019-10-07 (×3): 5 mg via ORAL
  Filled 2019-10-06 (×3): qty 1

## 2019-10-06 MED ORDER — KETOROLAC TROMETHAMINE 15 MG/ML IJ SOLN
15.0000 mg | Freq: Once | INTRAMUSCULAR | Status: AC
  Administered 2019-10-06: 15 mg via INTRAVENOUS
  Filled 2019-10-06: qty 1

## 2019-10-06 NOTE — Progress Notes (Signed)
[name] Kitchen  PROGRESS NOTE    Linda [name]  [id number] DOB: 1946/06/14 DOA: 10/04/2019 PCP: Tammi Sou, MD   Brief Narrative:   73 year old lady with prior history of hypertension, asthma, dementia, iron deficiency anemia, COPD, chronic diastolic heart failure, anxiety and depression presents to ED weakness and fatigue for 3 weeks.  She was seen at PCP office where she was found to be hypotensive with blood pressure 70s over 40s.  She was referred to ED and admitted for further evaluation.  Labs revealed positive urinary tract infection and she was admitted to [facility] service for further evaluation  10/14: C/o of pain in knees and dehydration.   Assessment & Plan:   Active Problems:   HTN (hypertension), benign   History of pulmonary embolism   Generalized anxiety disorder--with occasional panic attacks.    Diastolic heart failure (HCC)   UTI (urinary tract infection)   COPD (chronic obstructive pulmonary disease) (HCC)   Hypotension   Generalized weakness/fatigue hypotension urinary tract infection     - on IV rocephin; transition to keflex     - PT eval: HHPT; OT eval pending.     - Lactic acid and CRP are within normal limits.     - afebrile and WBC count wnl.  History of dementia     - Patient is alert and answering all questions appropriately     - Continue with Aricept  History of chronic diastolic heart failure     - She appears to be compensated.      - Echocardiogram in [id number] showed Systolic function was vigorous. The estimated ejection fraction was in the range of 65% to 70%. Wall motion was normal; Doppler parameters are consistent with abnormal left ventricular relaxation (grade 1 diastolic dysfunction).  Chronic pain     - d/c fentanyl     - continue PRN, oxycodone/percocet  Hypokalemia     - resolved  Anxiety and depression      - continue Cymbalta, Xanax and Lamictal.     - No suicidal or homicidal ideations  Hyperlipidemia     - Continue with  Crestor 20 mg daily.  COPD and asthma,      - atrovent  Awaiting OT eval. Arrange for HHPT. Transition to keflex. Still complains of pain all over, but currently on fentanyl. Will not escalate. Actually let's wean as a lot of this sounds chronic. Monitor. Hopeful for d/c in AM.   DVT prophylaxis: lovenox Code Status: FULL   Disposition Plan: TBD   Antimicrobials:  . Rocephin; now keflex   ROS:  Reports all over pain. Denies CP, dyspnea, HA, N, V. Remainder 10-pt ROS is negative for all not previously mentioned.  Subjective: "It just hurts. I have bone on bone, you know."  Objective: Vitals:   10/05/19 1837 10/05/19 2322 10/06/19 0551 10/06/19 1224  BP: (!) 175/69 (!) 145/63 140/79 (!) 146/66  Pulse: 66 69 66 72  Resp: 18 18 16 16   Temp: 98.1 F (36.7 C) 97.9 F (36.6 C) (!) 97.5 F (36.4 C) 97.8 F (36.6 C)  TempSrc: Oral Oral Oral Oral  SpO2: 96% 97% 98% 96%  Weight:      Height:        Intake/Output Summary (Last 24 hours) at 10/06/2019 1430 Last data filed at 10/06/2019 0700 Gross per 24 hour  Intake 1851.78 ml  Output 1900 ml  Net -48.22 ml   Filed Weights   10/04/19 2248 10/05/19 0446  Weight: 67.5 kg 69.4  kg    Examination:  General: 73 y.o. female resting in bed in NAD Cardiovascular: RRR, +S1, S2, no m/g/r, equal pulses throughout Respiratory: CTABL, no w/r/r, normal WOB GI: BS+, NDNT, no masses noted, no organomegaly noted MSK: No e/c/c Skin: No rashes, bruises, ulcerations noted Neuro: alert to name, follows commands   Data Reviewed: I have personally reviewed following labs and imaging studies.  CBC: Recent Labs  Lab 10/04/19 1124  WBC 7.1  NEUTROABS 3.9  HGB 12.9  HCT 39.4  MCV 92.5  PLT [id number]   Basic Metabolic Panel: Recent Labs  Lab 10/04/19 1124 10/04/19 1139 10/05/19 0401  NA 137  --  142  K 3.1*  --  3.8  CL 99  --  105  CO2 25  --  26  GLUCOSE 129*  --  99  BUN 10  --  7*  CREATININE 0.96  --  0.79  CALCIUM 8.6*   --  8.7*  MG  --  2.0  --    GFR: Estimated Creatinine Clearance: 59.9 mL/min (by C-G formula based on SCr of 0.79 mg/dL). Liver Function Tests: Recent Labs  Lab 10/04/19 1124  AST 22  ALT 13  ALKPHOS 65  BILITOT 0.6  PROT 6.3*  ALBUMIN 3.5   No results for input(s): LIPASE, AMYLASE in the last 168 hours. No results for input(s): AMMONIA in the last 168 hours. Coagulation Profile: Recent Labs  Lab 10/04/19 1124  INR 1.0   Cardiac Enzymes: Recent Labs  Lab 10/05/19 0401  CKTOTAL 80   BNP (last 3 results) No results for input(s): PROBNP in the last 8760 hours. HbA1C: No results for input(s): HGBA1C in the last 72 hours. CBG: No results for input(s): GLUCAP in the last 168 hours. Lipid Profile: No results for input(s): CHOL, HDL, LDLCALC, TRIG, CHOLHDL, LDLDIRECT in the last 72 hours. Thyroid Function Tests: Recent Labs    10/04/19 1124  TSH 2.750   Anemia Panel: No results for input(s): VITAMINB12, FOLATE, FERRITIN, TIBC, IRON, RETICCTPCT in the last 72 hours. Sepsis Labs: Recent Labs  Lab 10/04/19 1124 10/04/19 1242  LATICACIDVEN 1.9 1.2    Recent Results (from the past 240 hour(s))  Blood Culture (routine x 2)     Status: None (Preliminary result)   Collection Time: 10/04/19 11:24 AM   Specimen: BLOOD RIGHT HAND  Result Value Ref Range Status   Specimen Description BLOOD RIGHT HAND  Final   Special Requests   Final    BOTTLES DRAWN AEROBIC AND ANAEROBIC Blood Culture adequate volume   Culture   Final    NO GROWTH 1 DAY Performed at [facility], [place] [address] [place], [place] [address]    Report Status PENDING  Incomplete  SARS CORONAVIRUS 2 (TAT 6-24 HRS) Nasopharyngeal Nasopharyngeal Swab     Status: None   Collection Time: 10/04/19 12:00 PM   Specimen: Nasopharyngeal Swab  Result Value Ref Range Status   SARS Coronavirus 2 NEGATIVE NEGATIVE Final    Comment: (NOTE) SARS-CoV-2 target nucleic acids are NOT DETECTED. The SARS-CoV-2 RNA  is generally detectable in upper and lower respiratory specimens during the acute phase of infection. Negative results do not preclude SARS-CoV-2 infection, do not rule out co-infections with other pathogens, and should not be used as the sole basis for treatment or other patient management decisions. Negative results must be combined with clinical observations, patient history, and epidemiological information. The expected result is Negative. Fact Sheet for Patients: SugarRoll.be Fact Sheet  for Healthcare Providers: https://www.woods-mathews.com/ This test is not yet approved or cleared by the [place] and  has been authorized for detection and/or diagnosis of SARS-CoV-2 by FDA under an Emergency Use Authorization (EUA). This EUA will remain  in effect (meaning this test can be used) for the duration of the COVID-19 declaration under Section 56 4(b)(1) of the Act, 21 U.S.C. section 360bbb-3(b)(1), unless the authorization is terminated or revoked sooner. Performed at [facility], [place] [address], [place], [place] [address]   Blood Culture (routine x 2)     Status: None (Preliminary result)   Collection Time: 10/04/19 12:30 PM   Specimen: BLOOD RIGHT ARM  Result Value Ref Range Status   Specimen Description BLOOD RIGHT ARM  Final   Special Requests   Final    BOTTLES DRAWN AEROBIC AND ANAEROBIC Blood Culture adequate volume   Culture   Final    NO GROWTH 1 DAY Performed at [facility], [place] [address] [place], [place] [address]    Report Status PENDING  Incomplete  Urine culture     Status: Abnormal   Collection Time: 10/04/19  1:09 PM   Specimen: In/Out Cath Urine  Result Value Ref Range Status   Specimen Description IN/OUT CATH URINE  Final   Special Requests   Final    NONE Performed at [facility], [place] [address], [place], [place] [address]    Culture >=100,000 COLONIES/mL ESCHERICHIA COLI (A)  Final    Report Status 10/06/2019 FINAL  Final   Organism ID, Bacteria ESCHERICHIA COLI (A)  Final      Susceptibility   Escherichia coli - MIC*    AMPICILLIN 4 SENSITIVE Sensitive     CEFAZOLIN <=4 SENSITIVE Sensitive     CEFTRIAXONE <=1 SENSITIVE Sensitive     CIPROFLOXACIN <=0.25 SENSITIVE Sensitive     GENTAMICIN <=1 SENSITIVE Sensitive     IMIPENEM <=0.25 SENSITIVE Sensitive     NITROFURANTOIN 64 INTERMEDIATE Intermediate     TRIMETH/SULFA <=20 SENSITIVE Sensitive     AMPICILLIN/SULBACTAM <=2 SENSITIVE Sensitive     PIP/TAZO <=4 SENSITIVE Sensitive     Extended ESBL NEGATIVE Sensitive     * >=100,000 COLONIES/mL ESCHERICHIA COLI      Radiology Studies: No results found.   Scheduled Meds: . aspirin  81 mg Oral QHS  . donepezil  10 mg Oral QHS  . DULoxetine  30 mg Oral Daily  . DULoxetine  60 mg Oral Daily  . enoxaparin (LOVENOX) injection  40 mg Subcutaneous Q24H  . fluticasone  2 spray Each Nare Daily  . ipratropium  2 spray Each Nare BID  . lamoTRIgine  150 mg Oral Daily  . metoprolol succinate  50 mg Oral Daily  . pantoprazole  40 mg Oral Daily  . rosuvastatin  20 mg Oral Daily  . traZODone  100 mg Oral QHS   Continuous Infusions: . cefTRIAXone (ROCEPHIN)  IV 1 g (10/05/19 2030)  . dextrose 5 % and 0.45% NaCl 50 mL/hr at 10/05/19 1752     LOS: 2 days    Time spent: 25 minutes spent in the coordination of care today.    Jonnie Finner, DO [facility] Pager [phone]  If 7PM-7AM, please contact night-coverage [email or web address] Password [email or web address] 10/06/2019, 2:30 PM

## 2019-10-06 NOTE — Progress Notes (Signed)
Pt requesting to take 1mg  of xanax PRN per home dose instead of 0.25mg . Provider paged and made aware.

## 2019-10-06 NOTE — Progress Notes (Signed)
Physical Therapy Treatment Patient Details Name: Linda [name] MRN: [id number] DOB: 06-Aug-1946 Today's Date: 10/06/2019    History of Present Illness 73 year old female admitted with weakness, hypotension, general fatigue. PMH CAD, COPD, HF, HTN    PT Comments    Patient received in bed, reports she has been up some already this morning. Reluctant to participate initially, then agrees to walk in room. Patient demonstrates bed mobility with mod independence, transfers with min guard and increased effort due to B Knee pain. Ambulated with RW 60 feet in room with min guard/supervision. Improved ability this visit. Less labored with gait and no LOB. Patient will continue to benefit from skilled PT while here to improve functional independence and strength for return home.     Follow Up Recommendations  Home health PT     Equipment Recommendations  None recommended by PT    Recommendations for Other Services       Precautions / Restrictions Precautions Precautions: Fall Restrictions Weight Bearing Restrictions: No    Mobility  Bed Mobility Overal bed mobility: Independent                Transfers Overall transfer level: Needs assistance Equipment used: Rolling walker (2 wheeled) Transfers: Sit to/from Stand Sit to Stand: Min guard         General transfer comment: difficulty standing due to B Knee pain and back pain. No dizziness reported  Ambulation/Gait Ambulation/Gait assistance: Min guard;Supervision Gait Distance (Feet): 60 Feet   Gait Pattern/deviations: Step-to pattern;Decreased stride length;Antalgic;Trunk flexed Gait velocity: probably close to baseline   General Gait Details: patient has pain in B knees that is chronic. She is due for shot in knees. Improved gait ability today, improved steadiness, less labored.   Stairs             Wheelchair Mobility    Modified Rankin (Stroke Patients Only)       Balance Overall balance  assessment: Needs assistance Sitting-balance support: Feet supported Sitting balance-Leahy Scale: Good     Standing balance support: Bilateral upper extremity supported Standing balance-Leahy Scale: Good Standing balance comment: Improved balance using RW this visit. No LOB or unsteadiness noted.                            Cognition Arousal/Alertness: Awake/alert Behavior During Therapy: WFL for tasks assessed/performed Overall Cognitive Status: Within Functional Limits for tasks assessed                                        Exercises      General Comments        Pertinent Vitals/Pain Pain Assessment: 0-10 Pain Score: 6  Pain Location: B knees, spine Pain Descriptors / Indicators: Aching;Sore Pain Intervention(s): Limited activity within patient's tolerance;Monitored during session;Repositioned;Premedicated before session    Home Living                      Prior Function            PT Goals (current goals can now be found in the care plan section) Acute Rehab PT Goals Patient Stated Goal: to hopefully go back home, get stronger PT Goal Formulation: With patient Time For Goal Achievement: 10/12/19 Potential to Achieve Goals: Good Progress towards PT goals: Progressing toward goals    Frequency  Min 3X/week      PT Plan Current plan remains appropriate    Co-evaluation              AM-PAC PT "6 Clicks" Mobility   Outcome Measure  Help needed turning from your back to your side while in a flat bed without using bedrails?: None Help needed moving from lying on your back to sitting on the side of a flat bed without using bedrails?: None   Help needed standing up from a chair using your arms (e.g., wheelchair or bedside chair)?: A Little Help needed to walk in hospital room?: A Little Help needed climbing 3-5 steps with a railing? : A Lot 6 Click Score: 16    End of Session Equipment Utilized During  Treatment: Gait belt Activity Tolerance: Patient tolerated treatment well Patient left: in bed;with bed alarm set;with call bell/phone within reach Nurse Communication: Mobility status PT Visit Diagnosis: Muscle weakness (generalized) (M62.81);Difficulty in walking, not elsewhere classified (R26.2);Pain Pain - Right/Left: (bilateral knees, spine)     Time: [id number] PT Time Calculation (min) (ACUTE ONLY): 26 min  Charges:  $Gait Training: 23-37 mins                     Brandun Pinn, PT, GCS 10/06/19,10:50 AM

## 2019-10-07 ENCOUNTER — Telehealth: Payer: Self-pay | Admitting: Registered Nurse

## 2019-10-07 DIAGNOSIS — M1712 Unilateral primary osteoarthritis, left knee: Secondary | ICD-10-CM

## 2019-10-07 DIAGNOSIS — M797 Fibromyalgia: Secondary | ICD-10-CM

## 2019-10-07 LAB — CBC WITH DIFFERENTIAL/PLATELET
Abs Immature Granulocytes: 0.01 10*3/uL (ref 0.00–0.07)
Basophils Absolute: 0 10*3/uL (ref 0.0–0.1)
Basophils Relative: 1 %
Eosinophils Absolute: 0.2 10*3/uL (ref 0.0–0.5)
Eosinophils Relative: 3 %
HCT: 45.5 % (ref 36.0–46.0)
Hemoglobin: 14.8 g/dL (ref 12.0–15.0)
Immature Granulocytes: 0 %
Lymphocytes Relative: 28 %
Lymphs Abs: 1.7 10*3/uL (ref 0.7–4.0)
MCH: 29.3 pg (ref 26.0–34.0)
MCHC: 32.5 g/dL (ref 30.0–36.0)
MCV: 90.1 fL (ref 80.0–100.0)
Monocytes Absolute: 0.5 10*3/uL (ref 0.1–1.0)
Monocytes Relative: 9 %
Neutro Abs: 3.6 10*3/uL (ref 1.7–7.7)
Neutrophils Relative %: 59 %
Platelets: 216 10*3/uL (ref 150–400)
RBC: 5.05 MIL/uL (ref 3.87–5.11)
RDW: 11.9 % (ref 11.5–15.5)
WBC: 6 10*3/uL (ref 4.0–10.5)
nRBC: 0 % (ref 0.0–0.2)

## 2019-10-07 LAB — RENAL FUNCTION PANEL
Albumin: 3.8 g/dL (ref 3.5–5.0)
Anion gap: 12 (ref 5–15)
BUN: 6 mg/dL — ABNORMAL LOW (ref 8–23)
CO2: 27 mmol/L (ref 22–32)
Calcium: 9.1 mg/dL (ref 8.9–10.3)
Chloride: 101 mmol/L (ref 98–111)
Creatinine, Ser: 0.77 mg/dL (ref 0.44–1.00)
GFR calc Af Amer: >60 mL/min (ref >60)
GFR calc non Af Amer: >60 mL/min (ref >60)
Glucose, Bld: 100 mg/dL — ABNORMAL HIGH (ref 70–99)
Phosphorus: 4.4 mg/dL (ref 2.5–4.6)
Potassium: 3.2 mmol/L — ABNORMAL LOW (ref 3.5–5.1)
Sodium: 140 mmol/L (ref 135–145)

## 2019-10-07 LAB — MAGNESIUM: Magnesium: 2 mg/dL (ref 1.7–2.4)

## 2019-10-07 MED ORDER — CEPHALEXIN 500 MG PO CAPS: 500.0000 mg | ORAL_CAPSULE | Freq: Two times a day (BID) | ORAL | 0 refills | Status: AC

## 2019-10-07 MED ORDER — FUROSEMIDE 40 MG PO TABS: 40.0000 mg | ORAL_TABLET | Freq: Every day | ORAL | Status: DC | PRN

## 2019-10-07 MED ORDER — POTASSIUM CHLORIDE CRYS ER 20 MEQ PO TBCR
40.0000 meq | EXTENDED_RELEASE_TABLET | Freq: Once | ORAL | Status: AC
  Administered 2019-10-07: 40 meq via ORAL
  Filled 2019-10-07: qty 2

## 2019-10-07 NOTE — Progress Notes (Signed)
Pt has trouble sleeping during the night . Takes nap but is usually awake. Pain appears to be controlled with PO meds, pt appeared comfortable despite patient stating she needs IV medications.

## 2019-10-07 NOTE — Telephone Encounter (Signed)
Miss Rosine called our office as well.  She said she is being discharged today and will need her medication refilled

## 2019-10-07 NOTE — TOC Transition Note (Signed)
Transition of Care [facility]) - CM/SW Discharge Note   Patient Details  Name: CATRINIA RIGGINS MRN: [id number] Date of Birth: Apr 23, 1946  Transition of Care [facility]) CM/SW Contact:  Alexander Mt, [place] Phone Number: 10/07/2019, 11:17 AM   Clinical Narrative:    Pt bedside RN requesting PTAR transport home to [place]. Paperwork provided for RN.   PTAR # is [phone], all needed information for call is on the paperwork.    Final next level of care: Home/Self Care Barriers to Discharge: Barriers Resolved   Patient Goals and CMS Choice Patient states their goals for this hospitalization and ongoing recovery are:: to go home CMS Medicare.gov Compare Post Acute Care list provided to:: Patient Choice offered to / list presented to : Patient  Discharge Placement        Patient chooses bed at: [facility] Patient to be transferred to facility by: [place] Name of family member notified: pt will update family Patient and family notified of of transfer: 10/07/19  Discharge Plan and Services   Discharge Planning Services: CM Consult Post Acute Care Choice: Home Health          DME Arranged: N/A HH Arranged: Patient Refused [place]  Social Determinants of Health (SDOH) Interventions     Readmission Risk Interventions No flowsheet data found.

## 2019-10-07 NOTE — Progress Notes (Signed)
Mrs. Cihlar to be D/C'd Home per MD order.  Discussed with the patient and all questions fully answered.   VSS, Skin clean, dry and intact without evidence of skin break down, no evidence of skin tears noted. IV catheter discontinued intact. Site without signs and symptoms of complications. Dressing and pressure applied.   An After Visit Summary was printed and given to the patient.    D/C education completed with patient/family including follow up instructions, medication list, d/c activities limitations if indicated, with other d/c instructions as indicated by MD - patient able to verbalize understanding, all questions fully answered.    Patient instructed to return to ED, call 911, or call MD for any changes in condition.    Patient escorted via [place], and D/C home via PTAR to [facility].

## 2019-10-07 NOTE — TOC Progression Note (Addendum)
Transition of Care [facility]) - Progression Note    Patient Details  Name: Linda [name] MRN: [id number] Date of Birth: 1946/12/14  [facility] CM/SW Contact  Jacalyn Lefevre Edson Snowball, RN Phone Number: 10/07/2019, 8:38 AM  Clinical Narrative:     Discussed home health PT again with patient at bedside. Patient continues to decline HHPT. Patient stated that if she changes her mind after discharge , she will ask PCP to arrange. Paged Dr Marylyn Ishihara to make aware.   Expected Discharge Plan: Home/Self Care Barriers to Discharge: Continued Medical Work up  Expected Discharge Plan and Services Expected Discharge Plan: Home/Self Care   Discharge Planning Services: CM Consult Post Acute Care Choice: [place] arrangements for the past 2 months: Apartment                 DME Arranged: N/A         HH Arranged: Patient Refused [place]           Social Determinants of Health (SDOH) Interventions    Readmission Risk Interventions No flowsheet data found.

## 2019-10-07 NOTE — Progress Notes (Signed)
Occupational Therapy Evaluation Patient Details Name: Linda [name] MRN: [id number] DOB: Jul 26, 1946 Today's Date: 10/07/2019    History of Present Illness 73 year old female admitted with weakness, hypotension, general fatigue. PMH CAD, COPD, HF, HTN   Clinical Impression   Pt lives in [place] facility at [facility]. Pt upset with care at hospital and repeating same story several times during visit. Listening provided - nsg aware. Pt with mild unsteadiness and is at risk for falls. Recommend HHOT, which pt declines and states  That she "will order it if she needs it".  Recommend nsg staff at [facility] supervise medication management given pt's apparent cognitive deficits.  Pt states she needs to go home with ambulance transport due to her son being very ill.Nsg notified.      Follow Up Recommendations  Home health OT    Equipment Recommendations  None recommended by OT    Recommendations for Other Services       Precautions / Restrictions Precautions Precautions: Fall      Mobility Bed Mobility Overal bed mobility: Modified Independent                Transfers Overall transfer level: Needs assistance Equipment used: Rolling walker (2 wheeled) Transfers: Sit to/from [facility] Sit to Stand: Min guard Stand pivot transfers: Min guard       General transfer comment: complained of mild dizziness with standing.    Balance Overall balance assessment: Needs assistance   Sitting balance-Leahy Scale: Good       Standing balance-Leahy Scale: Fair                             ADL either performed or assessed with clinical judgement   ADL Overall ADL's : Needs assistance/impaired     Grooming: Set up;Sitting   Upper Body Bathing: Set up;Sitting   Lower Body Bathing: Min guard;Sit to/from stand   Upper Body Dressing : Set up;Sitting   Lower Body Dressing: Minimal assistance;Sit to/from stand Lower  Body Dressing Details (indicate cue type and reason): difficulty with socks Toilet Transfer: Min guard;Ambulation;RW   Toileting- Clothing Manipulation and Hygiene: Supervision/safety Toileting - Clothing Manipulation Details (indicate cue type and reason): Pt incontinent adn wears depends. discussed how she needs to change her depends frequently to reduce risk of developing UTI     Functional mobility during ADLs: Min guard;Rolling walker;Cueing for safety  Educated pt on availability of AE to help with ADL - pt states she is aware of equipment and will order it if needed.      Vision Baseline Vision/History: Wears glasses       Perception     Praxis      Pertinent Vitals/Pain Pain Assessment: Faces Faces Pain Scale: Hurts little more Pain Location: B knees, spine Pain Descriptors / Indicators: Aching;Sore Pain Intervention(s): Limited activity within patient's tolerance     Hand Dominance Right   Extremity/Trunk Assessment Upper Extremity Assessment Upper Extremity Assessment: Generalized weakness   Lower Extremity Assessment Lower Extremity Assessment: Defer to PT evaluation   Cervical / Trunk Assessment Cervical / Trunk Assessment: Normal   Communication Communication Communication: No difficulties   Cognition Arousal/Alertness: Awake/alert Behavior During Therapy: Anxious Overall Cognitive Status: No family/caregiver present to determine baseline cognitive functioning  General Comments: Pt with cognitive deficits at baseline; repeating same stroey 3 x during session regarding her disappointment with her care. Pt did talk with nursing director. Listening provided. Pt able to discuss her medicaiton management. Pt has new medicaiton added to list, which she was able to explain how she is to take her new meds.    General Comments  Reviewed information on fall prevention. Handout provided.     Exercises     Shoulder  Instructions      Home Living Family/patient expects to be discharged to:: Other (Comment)(independent Living apt - [facility]) Living Arrangements: Alone Available Help at Discharge: Available 24 hours/day;Personal care attendant(if needed) Type of Home: Independent living facility       Home Layout: One level     Bathroom Shower/Tub: Walk-in shower         Home Equipment: Kasandra Knudsen - single point;Wheelchair - manual;Shower seat;Hand held Tourist information centre manager - 4 wheels          Prior Functioning/Environment Level of Independence: Independent with assistive device(s)        Comments: used cane        OT Problem List: Decreased strength;Decreased activity tolerance;Impaired balance (sitting and/or standing);Decreased safety awareness;Decreased cognition;Decreased knowledge of use of DME or AE;Pain      OT Treatment/Interventions:      OT Goals(Current goals can be found in the care plan section) Acute Rehab OT Goals Patient Stated Goal: to go home OT Goal Formulation: All assessment and education complete, DC therapy  OT Frequency:     Barriers to D/C:            Co-evaluation              AM-PAC OT "6 Clicks" Daily Activity     Outcome Measure Help from another person eating meals?: None Help from another person taking care of personal grooming?: A Little Help from another person toileting, which includes using toliet, bedpan, or urinal?: A Little Help from another person bathing (including washing, rinsing, drying)?: A Little Help from another person to put on and taking off regular upper body clothing?: A Little Help from another person to put on and taking off regular lower body clothing?: A Little 6 Click Score: 19   End of Session Equipment Utilized During Treatment: Rolling walker Nurse Communication: Mobility status;Other (comment)(DC needs)  Activity Tolerance: Patient tolerated treatment well Patient left: in bed;with call bell/phone within  reach;with bed alarm set  OT Visit Diagnosis: Unsteadiness on feet (R26.81);Muscle weakness (generalized) (M62.81);Other symptoms and signs involving cognitive function;Pain Pain - part of body: (B knees)                Time: 1020-1045 OT Time Calculation (min): 25 min Charges:  OT General Charges $OT Visit: 1 Visit OT Evaluation $OT Eval Low Complexity: 1 Low OT Treatments $Self Care/Home Management : 8-22 mins  Maurie Boettcher, OT/L   Acute OT Clinical Specialist [place] Pager [phone] Office [phone]   [facility] 10/07/2019, 10:54 AM

## 2019-10-07 NOTE — Discharge Summary (Signed)
. Physician Discharge Summary  Linda [name] [id number] DOB: 10/27/1946 DOA: 10/04/2019  PCP: Tammi Sou, MD  Admit date: 10/04/2019 Discharge date: 10/07/2019  Admitted From: Home Disposition:  Discharged to home.   Recommendations for Outpatient Follow-up:  1. Follow up with PCP in 1 week 2. Please obtain BMP/CBC in one week  Discharge Condition: Stable  CODE STATUS: FULL   Brief/Interim Summary: 73 year old lady with prior history of hypertension, asthma, dementia, iron deficiency anemia, COPD, chronic diastolic heart failure, anxiety and depression presents to ED weakness and fatigue for 3 weeks.  She was seen at PCP office where she was found to be hypotensive with blood pressure 70s over 40s.  She was referred to ED and admitted for further evaluation.  Labs revealed positive urinary tract infection and she was admitted to [facility] service for further evaluation  10/14: C/o of pain in knees and dehydration. 10/15: C/o nursing interaction ON and slamming of doors. C/o was not getting home meds  Discharge Diagnoses:  Active Problems:   HTN (hypertension), benign   History of pulmonary embolism   Generalized anxiety disorder--with occasional panic attacks.    Diastolic heart failure (HCC)   UTI (urinary tract infection)   COPD (chronic obstructive pulmonary disease) (HCC)   Hypotension  Generalized weakness/fatigue hypotension urinary tract infection     - on IV rocephin; transition to keflex     - PT eval: HHPT; OT eval pending.     - PT rec HHPT; but patient is declining; she says she will have PCP arrange if she changes her mind     - Lactic acid and CRP are within normal limits.     - afebrile and WBC count wnl.     - BP is ok today     - stable on keflex; continue for 3 more days; ok for discharge.   History of dementia     - Patient is alert and answering all questions appropriately     - Continue with Aricept  History of chronic diastolic heart  failure     - She appears to be compensated.      - Echocardiogram in [id number] showed Systolic function was vigorous. The estimated ejection fraction was in the range of 65% to 70%. Wall motion was normal; Doppler parameters are consistent with abnormal left ventricular relaxation (grade 1 diastolic dysfunction).     - takes PRN lasix at home; does not currently appear overloaded; continue home regimen at discharge  Chronic pain     - d/c fentanyl     - continue PRN, oxycodone/percocet  Hypokalemia     - low again today; will give PO KCl     - follow up with PCP for labwork  Anxiety and depression      - continue Cymbalta, Xanax and Lamictal.     - No suicidal or homicidal ideations  Hyperlipidemia     - Continue with Crestor 20 mg daily.  COPD and asthma     - atrovent   Upset about nursing interaction ON. Upset about medication choices. Saying she will leave today. She no longer needs acute inpatient care. She is stable on keflex and labwork is largely ok. Needs some potassium. Will get her some before she goes. She needs to follow up with PCP in the next week. PT rec'd HHPT but she has declined. She states, "I don't need it"; however, she then talks about limited mobility. Again offered HHPT, but she states that she  will have PCP arrange if she changes her mind. Discharge plan discussed and agreed to by patient.    Discharge Instructions   Allergies as of 10/07/2019      Reactions   Abilify [aripiprazole] Other (See Comments)   parkinsonism   Penicillins Itching, Swelling, Rash   Tolerated Cefepime in ED. Has patient had a PCN reaction causing immediate rash, facial/tongue/throat swelling, SOB or lightheadedness with hypotension: Yes Has patient had a PCN reaction causing severe rash involving mucus membranes or skin necrosis: No Has patient had a PCN reaction that required hospitalization: No  Has patient had a PCN reaction occurring within the last 10 years: No       Medication List    TAKE these medications   alendronate 70 MG tablet Commonly known as: FOSAMAX Take 1 tablet (70 mg total) by mouth every 7 (seven) days. Take with a full glass of water on an empty stomach, first thing in the morning and remain up right for 30 minutes after taking.   ALPRAZolam 1 MG tablet Commonly known as: XANAX TAKE 1 TABLET THREE TIMES DAILY AS NEEDED FOR ANXIETY. What changed: See the new instructions.   aspirin 81 MG tablet Take 81 mg by mouth at bedtime.   cephALEXin 500 MG capsule Commonly known as: KEFLEX Take 1 capsule (500 mg total) by mouth 2 (two) times daily for 3 days.   diclofenac sodium 1 % Gel Commonly known as: VOLTAREN Apply 2 g topically 3 (three) times daily as needed (pain).   donepezil 10 MG tablet Commonly known as: Aricept Take 1 tablet (10 mg total) by mouth at bedtime.   DULoxetine 30 MG capsule Commonly known as: CYMBALTA TAKE 1 CAPSULE ONCE DAILY ALONG WITH 60MG  CAPSULE FOR TOTAL 90MG  DAILY. What changed: See the new instructions.   DULoxetine 60 MG capsule Commonly known as: CYMBALTA TAKE 1 CAPSULE A DAY TO BE COMBINED WITH 30MG  CAPSULE What changed: See the new instructions.   fluconazole 100 MG tablet Commonly known as: Diflucan 1 tab po qd prn thrush What changed:   how much to take  how to take this  when to take this  reasons to take this  additional instructions   fluticasone 50 MCG/ACT nasal spray Commonly known as: FLONASE Place 2 sprays into both nostrils daily.   furosemide 40 MG tablet Commonly known as: LASIX Take 1 tablet (40 mg total) by mouth daily as needed for fluid.   ipratropium 0.03 % nasal spray Commonly known as: ATROVENT Place 2 sprays into both nostrils every 12 (twelve) hours.   isosorbide mononitrate 30 MG 24 hr tablet Commonly known as: IMDUR TAKE 2 TABLETS IN THE AM AND 1 TABLET IN THE PM. What changed: See the new instructions.   lamoTRIgine 150 MG tablet Commonly  known as: LaMICtal Take 1 tablet (150 mg total) by mouth daily.   metoprolol succinate 50 MG 24 hr tablet Commonly known as: TOPROL-XL TAKE (1) TABLET DAILY IN THE MORNING. What changed:   how much to take  how to take this  when to take this  additional instructions   minocycline 50 MG tablet Commonly known as: DYNACIN Take 1 tablet (50 mg total) by mouth 2 (two) times daily.   multivitamin with minerals Tabs tablet Take 1 tablet by mouth daily.   nitroGLYCERIN 0.4 MG SL tablet Commonly known as: NITROSTAT Place 1 tablet (0.4 mg total) under the tongue every 5 (five) minutes as needed for chest pain (x 3 doses).  ondansetron 8 MG tablet Commonly known as: Zofran Take 1 tablet (8 mg total) by mouth every 8 (eight) hours as needed for nausea or vomiting.   oxyCODONE-acetaminophen 10-325 MG tablet Commonly known as: Percocet Take 1 tablet by mouth every 6 (six) hours as needed for pain.   pantoprazole 40 MG tablet Commonly known as: PROTONIX TAKE 1 TABLET BY MOUTH DAILY.   rosuvastatin 20 MG tablet Commonly known as: CRESTOR Take 1 tablet (20 mg total) by mouth daily.   traZODone 50 MG tablet Commonly known as: DESYREL TAKE 2-4 TABLETS AT BEDTIME What changed: See the new instructions.   Vitamin D (Ergocalciferol) 1.25 MG (50000 UT) Caps capsule Commonly known as: DRISDOL TAKE (1) CAPSULE TWICE A WEEK. What changed: See the new instructions.       Allergies  Allergen Reactions  . Abilify [Aripiprazole] Other (See Comments)    parkinsonism  . Penicillins Itching, Swelling and Rash    Tolerated Cefepime in ED. Has patient had a PCN reaction causing immediate rash, facial/tongue/throat swelling, SOB or lightheadedness with hypotension: Yes Has patient had a PCN reaction causing severe rash involving mucus membranes or skin necrosis: No Has patient had a PCN reaction that required hospitalization: No  Has patient had a PCN reaction occurring within the last  10 years: No      Procedures/Studies: Dg Chest Port 1 View  Result Date: 10/04/2019 CLINICAL DATA:  Weakness. Unsteady gait. EXAM: PORTABLE CHEST 1 VIEW COMPARISON:  07/23/2019 FINDINGS: Artifact overlies the chest. Heart size is normal. Chronic aortic atherosclerosis. Chronic calcified pleural plaques. No sign of active infiltrate, mass, effusion or collapse. No acute bone finding. IMPRESSION: 1. No active disease. Chronic calcified pleural plaques. 2. Aortic atherosclerosis. Electronically Signed   By: Nelson Chimes M.D.   On: 10/04/2019 11:50      Subjective: "I am disappointed."  Discharge Exam: Vitals:   10/07/19 0001 10/07/19 0437  BP: 139/77 (!) 156/77  Pulse: 66 75  Resp: 18 18  Temp: 97.6 F (36.4 C) 97.8 F (36.6 C)  SpO2: 96% 95%   Vitals:   10/06/19 1224 10/06/19 1808 10/07/19 0001 10/07/19 0437  BP: (!) 146/66 (!) 148/79 139/77 (!) 156/77  Pulse: 72 72 66 75  Resp: 16 18 18 18   Temp: 97.8 F (36.6 C) 98.2 F (36.8 C) 97.6 F (36.4 C) 97.8 F (36.6 C)  TempSrc: Oral Oral Oral Oral  SpO2: 96% 95% 96% 95%  Weight:      Height:        General: 73 y.o. female resting in bed in NAD Cardiovascular: RRR, +S1, S2, no m/g/r, equal pulses throughout Respiratory: CTABL, no w/r/r, normal WOB GI: BS+, NDNT, no masses noted, no organomegaly noted MSK: No e/c/c Skin: No rashes, bruises, ulcerations noted Neuro: alert to name, follows commands    The results of significant diagnostics from this hospitalization (including imaging, microbiology, ancillary and laboratory) are listed below for reference.     Microbiology: Recent Results (from the past 240 hour(s))  Blood Culture (routine x 2)     Status: None (Preliminary result)   Collection Time: 10/04/19 11:24 AM   Specimen: BLOOD RIGHT HAND  Result Value Ref Range Status   Specimen Description BLOOD RIGHT HAND  Final   Special Requests   Final    BOTTLES DRAWN AEROBIC AND ANAEROBIC Blood Culture adequate  volume   Culture   Final    NO GROWTH 1 DAY Performed at [facility], [address] [place], [place] [address]    Report Status PENDING  Incomplete  SARS CORONAVIRUS 2 (TAT 6-24 HRS) Nasopharyngeal Nasopharyngeal Swab     Status: None   Collection Time: 10/04/19 12:00 PM   Specimen: Nasopharyngeal Swab  Result Value Ref Range Status   SARS Coronavirus 2 NEGATIVE NEGATIVE Final    Comment: (NOTE) SARS-CoV-2 target nucleic acids are NOT DETECTED. The SARS-CoV-2 RNA is generally detectable in upper and lower respiratory specimens during the acute phase of infection. Negative results do not preclude SARS-CoV-2 infection, do not rule out co-infections with other pathogens, and should not be used as the sole basis for treatment or other patient management decisions. Negative results must be combined with clinical observations, patient history, and epidemiological information. The expected result is Negative. Fact Sheet for Patients: SugarRoll.be Fact Sheet for Healthcare Providers: https://www.woods-mathews.com/ This test is not yet approved or cleared by the [place] FDA and  has been authorized for detection and/or diagnosis of SARS-CoV-2 by FDA under an Emergency Use Authorization (EUA). This EUA will remain  in effect (meaning this test can be used) for the duration of the COVID-19 declaration under Section 56 4(b)(1) of the Act, 21 U.S.C. section 360bbb-3(b)(1), unless the authorization is terminated or revoked sooner. Performed at [facility], [place] [address], [place], [place] [address]   Blood Culture (routine x 2)     Status: None (Preliminary result)   Collection Time: 10/04/19 12:30 PM   Specimen: BLOOD RIGHT ARM  Result Value Ref Range Status   Specimen Description BLOOD RIGHT ARM  Final   Special Requests   Final    BOTTLES DRAWN AEROBIC AND ANAEROBIC Blood Culture adequate volume   Culture   Final    NO  GROWTH 1 DAY Performed at [facility], [place] [address], [place], [place] [address]    Report Status PENDING  Incomplete  Urine culture     Status: Abnormal   Collection Time: 10/04/19  1:09 PM   Specimen: In/Out Cath Urine  Result Value Ref Range Status   Specimen Description IN/OUT CATH URINE  Final   Special Requests   Final    NONE Performed at [facility], [place] [address], [place], [place] [address]    Culture >=100,000 COLONIES/mL ESCHERICHIA COLI (A)  Final   Report Status 10/06/2019 FINAL  Final   Organism ID, Bacteria ESCHERICHIA COLI (A)  Final      Susceptibility   Escherichia coli - MIC*    AMPICILLIN 4 SENSITIVE Sensitive     CEFAZOLIN <=4 SENSITIVE Sensitive     CEFTRIAXONE <=1 SENSITIVE Sensitive     CIPROFLOXACIN <=0.25 SENSITIVE Sensitive     GENTAMICIN <=1 SENSITIVE Sensitive     IMIPENEM <=0.25 SENSITIVE Sensitive     NITROFURANTOIN 64 INTERMEDIATE Intermediate     TRIMETH/SULFA <=20 SENSITIVE Sensitive     AMPICILLIN/SULBACTAM <=2 SENSITIVE Sensitive     PIP/TAZO <=4 SENSITIVE Sensitive     Extended ESBL NEGATIVE Sensitive     * >=100,000 COLONIES/mL ESCHERICHIA COLI     Labs: BNP (last 3 results) No results for input(s): BNP in the last 8760 hours. Basic Metabolic Panel: Recent Labs  Lab 10/04/19 1124 10/04/19 1139 10/05/19 0401  NA 137  --  142  K 3.1*  --  3.8  CL 99  --  105  CO2 25  --  26  GLUCOSE 129*  --  99  BUN 10  --  7*  CREATININE  0.96  --  0.79  CALCIUM 8.6*  --  8.7*  MG  --  2.0  --    Liver Function Tests: Recent Labs  Lab 10/04/19 1124  AST 22  ALT 13  ALKPHOS 65  BILITOT 0.6  PROT 6.3*  ALBUMIN 3.5   No results for input(s): LIPASE, AMYLASE in the last 168 hours. No results for input(s): AMMONIA in the last 168 hours. CBC: Recent Labs  Lab 10/04/19 1124  WBC 7.1  NEUTROABS 3.9  HGB 12.9  HCT 39.4  MCV 92.5  PLT 197   Cardiac Enzymes: Recent Labs  Lab 10/05/19 0401  CKTOTAL 80    BNP: Invalid input(s): POCBNP CBG: No results for input(s): GLUCAP in the last 168 hours. D-Dimer No results for input(s): DDIMER in the last 72 hours. Hgb A1c No results for input(s): HGBA1C in the last 72 hours. Lipid Profile No results for input(s): CHOL, HDL, LDLCALC, TRIG, CHOLHDL, LDLDIRECT in the last 72 hours. Thyroid function studies Recent Labs    10/04/19 1124  TSH 2.750   Anemia work up No results for input(s): VITAMINB12, FOLATE, FERRITIN, TIBC, IRON, RETICCTPCT in the last 72 hours. Urinalysis    Component Value Date/Time   COLORURINE YELLOW 10/04/2019 [place] 10/04/2019 1305   LABSPEC 1.004 (L) 10/04/2019 1305   PHURINE 6.0 10/04/2019 1305   GLUCOSEU NEGATIVE 10/04/2019 1305   HGBUR NEGATIVE 10/04/2019 [place] 10/04/2019 1305   BILIRUBINUR Negative 08/07/2018 1437   KETONESUR NEGATIVE 10/04/2019 1305   PROTEINUR NEGATIVE 10/04/2019 1305   UROBILINOGEN 0.2 08/07/2018 1437   UROBILINOGEN 1.0 10/16/2015 2009   NITRITE POSITIVE (A) 10/04/2019 1305   LEUKOCYTESUR NEGATIVE 10/04/2019 1305   Sepsis Labs Invalid input(s): PROCALCITONIN,  WBC,  LACTICIDVEN Microbiology Recent Results (from the past 240 hour(s))  Blood Culture (routine x 2)     Status: None (Preliminary result)   Collection Time: 10/04/19 11:24 AM   Specimen: BLOOD RIGHT HAND  Result Value Ref Range Status   Specimen Description BLOOD RIGHT HAND  Final   Special Requests   Final    BOTTLES DRAWN AEROBIC AND ANAEROBIC Blood Culture adequate volume   Culture   Final    NO GROWTH 1 DAY Performed at [facility], [place] [address], [place], [place] [address]    Report Status PENDING  Incomplete  SARS CORONAVIRUS 2 (TAT 6-24 HRS) Nasopharyngeal Nasopharyngeal Swab     Status: None   Collection Time: 10/04/19 12:00 PM   Specimen: Nasopharyngeal Swab  Result Value Ref Range Status   SARS Coronavirus 2 NEGATIVE NEGATIVE Final    Comment:  (NOTE) SARS-CoV-2 target nucleic acids are NOT DETECTED. The SARS-CoV-2 RNA is generally detectable in upper and lower respiratory specimens during the acute phase of infection. Negative results do not preclude SARS-CoV-2 infection, do not rule out co-infections with other pathogens, and should not be used as the sole basis for treatment or other patient management decisions. Negative results must be combined with clinical observations, patient history, and epidemiological information. The expected result is Negative. Fact Sheet for Patients: SugarRoll.be Fact Sheet for Healthcare Providers: https://www.woods-mathews.com/ This test is not yet approved or cleared by the [place] FDA and  has been authorized for detection and/or diagnosis of SARS-CoV-2 by FDA under an Emergency Use Authorization (EUA). This EUA will remain  in effect (meaning this test can be used) for the duration of the COVID-19 declaration under Section 56 4(b)(1) of the  Act, 21 U.S.C. section 360bbb-3(b)(1), unless the authorization is terminated or revoked sooner. Performed at [facility], [place] [address] [place], [place] [address]   Blood Culture (routine x 2)     Status: None (Preliminary result)   Collection Time: 10/04/19 12:30 PM   Specimen: BLOOD RIGHT ARM  Result Value Ref Range Status   Specimen Description BLOOD RIGHT ARM  Final   Special Requests   Final    BOTTLES DRAWN AEROBIC AND ANAEROBIC Blood Culture adequate volume   Culture   Final    NO GROWTH 1 DAY Performed at [facility], [place] [address] [place], [place] [address]    Report Status PENDING  Incomplete  Urine culture     Status: Abnormal   Collection Time: 10/04/19  1:09 PM   Specimen: In/Out Cath Urine  Result Value Ref Range Status   Specimen Description IN/OUT CATH URINE  Final   Special Requests   Final    NONE Performed at [facility], [place] [address],  [place], [place] [address]    Culture >=100,000 COLONIES/mL ESCHERICHIA COLI (A)  Final   Report Status 10/06/2019 FINAL  Final   Organism ID, Bacteria ESCHERICHIA COLI (A)  Final      Susceptibility   Escherichia coli - MIC*    AMPICILLIN 4 SENSITIVE Sensitive     CEFAZOLIN <=4 SENSITIVE Sensitive     CEFTRIAXONE <=1 SENSITIVE Sensitive     CIPROFLOXACIN <=0.25 SENSITIVE Sensitive     GENTAMICIN <=1 SENSITIVE Sensitive     IMIPENEM <=0.25 SENSITIVE Sensitive     NITROFURANTOIN 64 INTERMEDIATE Intermediate     TRIMETH/SULFA <=20 SENSITIVE Sensitive     AMPICILLIN/SULBACTAM <=2 SENSITIVE Sensitive     PIP/TAZO <=4 SENSITIVE Sensitive     Extended ESBL NEGATIVE Sensitive     * >=100,000 COLONIES/mL ESCHERICHIA COLI     Time coordinating discharge:35 minutes  SIGNED:   Jonnie Finner, DO  [facility] 10/07/2019, 7:25 AM Pager   If 7PM-7AM, please contact night-coverage [email or web address] Password [email or web address]

## 2019-10-07 NOTE — Care Management Important Message (Signed)
Important Message  Patient Details  Name: Linda [name] MRN: [id number] Date of Birth: 1946/07/21   Medicare Important Message Given:  Yes     Orbie Pyo 10/07/2019, 2:33 PM

## 2019-10-07 NOTE — Discharge Instructions (Signed)
Hypotension °As your heart beats, it forces blood through your body. This force is called blood pressure. If you have hypotension, you have low blood pressure. When your blood pressure is too low, you may not get enough blood to your brain or other parts of your body. This may cause you to feel weak, light-headed, have a fast heartbeat, or even pass out (faint). Low blood pressure may be harmless, or it may cause serious problems. °What are the causes? °· Blood loss. °· Not enough water in the body (dehydration). °· Heart problems. °· Hormone problems. °· Pregnancy. °· A very bad infection. °· Not having enough of certain nutrients. °· Very bad allergic reactions. °· Certain medicines. °What increases the risk? °· Age. The risk increases as you get older. °· Conditions that affect the heart or the brain and spinal cord (central nervous system). °· Taking certain medicines. °· Being pregnant. °What are the signs or symptoms? °· Feeling: °? Weak. °? Light-headed. °? Dizzy. °? Tired (fatigued). °· Blurred vision. °· Fast heartbeat. °· Passing out, in very bad cases. °How is this treated? °· Changing your diet. This may involve eating more salt (sodium) or drinking more water. °· Taking medicines to raise your blood pressure. °· Changing how much you take (the dosage) of some of your medicines. °· Wearing compression stockings. These stockings help to prevent blood clots and reduce swelling in your legs. °In some cases, you may need to go to the hospital for: °· Fluid replacement. This means you will receive fluids through an IV tube. °· Blood replacement. This means you will receive donated blood through an IV tube (transfusion). °· Treating an infection or heart problems, if this applies. °· Monitoring. You may need to be monitored while medicines that you are taking wear off. °Follow these instructions at home: °Eating and drinking ° °· Drink enough fluids to keep your pee (urine) pale yellow. °· Eat a healthy diet.  Follow instructions from your doctor about what you can eat or drink. A healthy diet includes: °? Fresh fruits and vegetables. °? Whole grains. °? Low-fat (lean) meats. °? Low-fat dairy products. °· Eat extra salt only as told. Do not add extra salt to your diet unless your doctor tells you to. °· Eat small meals often. °· Avoid standing up quickly after you eat. °Medicines °· Take over-the-counter and prescription medicines only as told by your doctor. °? Follow instructions from your doctor about changing how much you take of your medicines, if this applies. °? Do not stop or change any of your medicines on your own. °General instructions ° °· Wear compression stockings as told by your doctor. °· Get up slowly from lying down or sitting. °· Avoid hot showers and a lot of heat as told by your doctor. °· Return to your normal activities as told by your doctor. Ask what activities are safe for you. °· Do not use any products that contain nicotine or tobacco, such as cigarettes, e-cigarettes, and chewing tobacco. If you need help quitting, ask your doctor. °· Keep all follow-up visits as told by your doctor. This is important. °Contact a doctor if: °· You throw up (vomit). °· You have watery poop (diarrhea). °· You have a fever for more than 2-3 days. °· You feel more thirsty than normal. °· You feel weak and tired. °Get help right away if: °· You have chest pain. °· You have a fast or uneven heartbeat. °· You lose feeling (have numbness) in any   part of your body. °· You cannot move your arms or your legs. °· You have trouble talking. °· You get sweaty or feel light-headed. °· You pass out. °· You have trouble breathing. °· You have trouble staying awake. °· You feel mixed up (confused). °Summary °· Hypotension is also called low blood pressure. It is when the force of blood pumping through your arteries is too weak. °· Hypotension may be harmless, or it may cause serious problems. °· Treatment may include changing  your diet and medicines, and wearing compression stockings. °· In very bad cases, you may need to go to the hospital. °This information is not intended to replace advice given to you by your health care provider. Make sure you discuss any questions you have with your health care provider. °Document Released: 03/05/2010 Document Revised: 06/04/2018 Document Reviewed: 06/04/2018 °Elsevier Patient Education © 2020 [employer] ° °

## 2019-10-08 ENCOUNTER — Telehealth: Payer: Self-pay | Admitting: Family Medicine

## 2019-10-08 NOTE — Telephone Encounter (Signed)
Patient was very upset and rude demanding to be on the schedule or talk to PCP to get advice about what other meds she can take for UTI/sepsis. Patient stated "she did not receive good care in the hospital and could care for herself better at home." I advised that all I could do at the moment was send a message to PCP but it would not be something that could happen immediately due to still seeing patients in office. I tried to offer hospital follow up for next week and she declined that at the moment stating she did not want to go into the weekend with this issue. I restated that a message would be sent. Patient continued to get upset stating her kids "questioned why she had not heard back from PCP yet" and demanding something to be done. Patient hung up the phone.

## 2019-10-08 NOTE — Telephone Encounter (Signed)
Pt was called and was given all information. She states she is not any better and is worse. Pt asked for referral to go to emergency room, pt was told she did not need referral and she needed to go to ER. Pt agreed.

## 2019-10-08 NOTE — Telephone Encounter (Signed)
(  I reviewed pt's hospital d/c summary. It documents that she clinically improved with the treatments she received in the hospital and was improved enough to safely continue treatment out of the hospital.   It also documents that she was upset about "nursing interaction overnight" and questioned the med choices of the hospital providers (no specific names or meds were noted).  She told them she was going to leave on the day of discharge and they essentially agreed that she was ok to go home and finish antibiotics. PT saw her in hosp and recommended [facility] but she declined.)  Pls call pt and tell her I reviewed her chart from hospital. In my opinion, she got the appropriate treatments for her problems and the decision to discharge her was based on her adequate clinical improvement with their treatments.  It is documented that she wanted to leave on the day of discharge as well, stating negative interaction with nursing and disagreement with med choices of the treating providers in hospital.  She was to continue treatment with oral antibiotics after discharge.   I reviewed her urine culture and the results showed that the bacteria that was in it would be adequately treated with the antibiotics that she was given. If she feels that she is still too ill to be at home then she should return to the emergency department.  Signed:  Crissie Sickles, MD           10/08/2019

## 2019-10-08 NOTE — Telephone Encounter (Signed)
Placed a call to Ms. Dimas Millin, PMP was reviewed and Oxycodone was e-scribed. She verbalizes understanding. She has a scheduled appointment with Dr. Naaman Plummer on 11/03/2019.

## 2019-10-08 NOTE — Telephone Encounter (Signed)
Patient was discharged from hospital yesterday. They diagnosed her with Sepsis. She is very concerned if gone into her blood stream, why did they discharge her?    Please contact patient.

## 2019-10-09 LAB — CULTURE, BLOOD (ROUTINE X 2)
Culture: NO GROWTH
Culture: NO GROWTH
Special Requests: ADEQUATE
Special Requests: ADEQUATE

## 2019-10-11 ENCOUNTER — Other Ambulatory Visit: Payer: Self-pay | Admitting: Family Medicine

## 2019-10-11 ENCOUNTER — Telehealth: Payer: Self-pay | Admitting: Family Medicine

## 2019-10-11 NOTE — Telephone Encounter (Signed)
Requesting: alprazolam Contract:09/27/19 UDS:n/a Last Visit:09/27/19 Next Visit:10/14/19 Last Refill:04/05/19 (90,5)   Please Advise. Medication pending

## 2019-10-11 NOTE — Telephone Encounter (Signed)
Patient has been scheduled for 10/14/19

## 2019-10-11 NOTE — Telephone Encounter (Signed)
Transition Care Management Follow-up Telephone Call  Admit date: 10/04/2019 Discharge date: 10/07/2019 Diagnosis: Hypotension; Weakness; UTI   How have you been since you were released from the hospital? "I've been trying to see my doctor since Friday". Patient reports some improvement with symptoms. Reports on 10/10/2019, she reached over bed and couldn't move or reach pull cord d/t "my knees didn't work". She reports vomiting at that time. Patient denies fever or emesis today. Discussed PT to increase strength, patient declines.    Do you understand why you were in the hospital? yes   Do you understand the discharge instructions? Yes.    Where were you discharged to? Home. Resides in [employer].    Items Reviewed:  Medications reviewed: no  Allergies reviewed: no  Dietary changes reviewed: yes  Referrals reviewed: yes   Functional Questionnaire:   Activities of Daily Living (ADLs):   She states they are independent in the following: ambulation, bathing and hygiene, feeding, continence, grooming, toileting and dressing States they require assistance with the following: None. However, states walking is difficult.    Any transportation issues/concerns?: no   Any patient concerns? Pt states she will be seeing another provider before Thursday (appt with PCP). "Someone that my daughter in law sees". I requested pt keep [place] posted on status of other appt and to call if needed.    Confirmed importance and date/time of follow-up visits scheduled yes  Provider Appointment booked with PCP Thursday, 10/14/2019.   Confirmed with patient if condition begins to worsen call PCP or go to the ER.  Patient was given the office number and encouraged to call back with question or concerns.  : yes

## 2019-10-11 NOTE — Telephone Encounter (Signed)
Please schedule patient for hospital follow up within the next 2 weeks.

## 2019-10-11 NOTE — Telephone Encounter (Signed)
Patient is requesting hospital follow up appointment this week. No appointments available this week. Please advise.

## 2019-10-12 DIAGNOSIS — Z09 Encounter for follow-up examination after completed treatment for conditions other than malignant neoplasm: Secondary | ICD-10-CM | POA: Diagnosis not present

## 2019-10-12 DIAGNOSIS — N390 Urinary tract infection, site not specified: Secondary | ICD-10-CM | POA: Diagnosis not present

## 2019-10-12 DIAGNOSIS — Z8619 Personal history of other infectious and parasitic diseases: Secondary | ICD-10-CM | POA: Diagnosis not present

## 2019-10-12 NOTE — Telephone Encounter (Signed)
Noted: nurse phone contact with patient for TCM. Signed:  Crissie Sickles, MD           10/12/2019

## 2019-10-14 ENCOUNTER — Inpatient Hospital Stay: Payer: BLUE CROSS/BLUE SHIELD | Admitting: Family Medicine

## 2019-10-14 NOTE — Progress Notes (Deleted)
Patient did not show for this appt today. 10/14/2019  CC: No chief complaint on file.   Patient is a 73 y.o. Caucasian female who presents accompanied by her son for hospital follow up, specifically [facility] face-to-face visit. Dates hospitalized: 10/12-10/15, 2020. Days since d/c from hospital: 7 days Patient was discharged from hospital to her independent living facility. Reason for admission to hospital: hypotension when at [facility] office visit, sent to ED and found to have UTI, possible sepsis->admitted and put on rocephin->e coli sensitive to many abx including keflex which she was transitioned to and d/c'd home on.  Some pt dissatisfaction with care and negative nursing/MD interactions were noted while hospitalized. Date of interactive (phone) contact with patient and/or caregiver: 10/11/19  I have reviewed patient's discharge summary plus pertinent specific notes, labs, and imaging from the hospitalization.   See above->also, Blood cultures NEG x 5d.  {current status of patient, symptoms, etc}  Medication reconciliation was done today and patient {is not} {is} taking meds as recommended by discharging hospitalist/specialist.    PMH:  Past Medical History:  Diagnosis Date  . Acute upper GI bleed 06/2014   while pt taking coumadin, plavix, and meloxicam---despite being told not to take coumadin.  . Anginal pain ([place])    Nonobstructive CAD 2014; however, her cardiologist put her on a statin for this and NOT for hyperlipidemia per pt report.  Atyp CP 08/2017 at card f/u, plan for myoc perf imaging.  . Anxiety    panic attacks  . Asthma    w/ asbestososis   . BPPV (benign paroxysmal positional vertigo) 12/16/2012  . Chronic diastolic CHF (congestive heart failure) (HCC)    dry wt as of 11/06/16 is 168 lbs.  . Chronic lower back pain   . COPD (chronic obstructive pulmonary disease) ([place])   . DDD (degenerative disc disease)    lumbar and cervical.    . Diverticular disease   . Fibromyalgia    Patient states dx was around her late 57s but she had sx's for years prior to this.  . H/O hiatal hernia   . Hay fever   . History of pneumonia    hospitalized 12/2011, 02/2013, and 07/2013 [facility]) for this  . HTN (hypertension)    Renal artery dopplers 04/2013 neg for stenosis.  . Hypervitaminosis D 09/27/2019   over-supplemented.  Stopped vit D and plan recheck 2 mo.  . Idiopathic angio-edema-urticaria 72014   Angioedema component was very minimal  . Insomnia   . Iron deficiency anemia    Hematologist in [place], [place] did extensive w/u; no cause found; failed oral supplement;; gets fairly regular (q49m or so) IV iron infusions (Venofer -iron sucrose- 200mg  with procrit.  "for 14 yr I've been getting blood work q month & getting infusions prn" (07/12/2013).  Dr. Marin Olp locally, iron infusions done, EPO deficiency dx'd  . Migraine syndrome    "not as often anymore; used to be ~ q wk" (07/12/2013)  . Mixed incontinence urge and stress   . Nephrolithiasis    "passed all on my own or they are still in there" (07/12/2013)  . Neuroleptic induced Parkinsonism ([place]) 2018   Dr. Carles Collet, neuro, saw her 11/24/17 and recommended d/c of abilify as first step.  D/c'd abilify and pt got complete recovery.  . OSA on CPAP    prior to move to [place]--had another sleep study 10/2015 w/pulm Dr. Camillo Flaming.  . Osteoarthritis    "severe; progressing fast" (07/12/2013); multiple joints-not  surgical candidate for TKR (03/2015).  Triamcinolon knee injections by Dr. Tessa Lerner 12/2017.  [name] Kitchen Pernicious anemia 08/24/2014  . Pleural plaque with presence of asbestos 07/22/2013  . Pulmonary embolism ([place]) 07/2013   Dx at [facility] with very small peripheral upper lobe pe 07/2013: pt took coumadin for about 8-9 mo  . Pyelonephritis    "several times over the last 30 yr" (07/12/2013)  . RBBB (right bundle branch block)   . Recurrent major depression ([place])   . Recurrent UTI    hx of hospitalization for  pyelonephritis; started abx prophylaxis 06/2015  . Syncope    Hypotensive; ED visit--Dr. Terrence Dupont did Cath--nonobstructive CAD, EF 55-60%.  In retrospect, suspect pt rx med misuse/polypharmacy  . Tension headache, chronic     PSH:  Past Surgical History:  Procedure Laterality Date  . APPENDECTOMY  1960  . AXILLARY SURGERY Left 1978   Multiple "lump" in armpit per pt  . CARDIAC CATHETERIZATION  01/2013   nonobstructive CAD, EF 55-60%  . CARDIOVASCULAR STRESS TEST  02/22/15   Low risk myocard perf imaging; wall motion normal, normal EF  . carotid duplex doppler  10/21/2017   R vertebral flow suggestive of possible distal obstruction.  Pt declined further w/u as of 10/29/17 but need to revisit this problem periodically.  . COCCYX REMOVAL  1972  . DEXA  06/05/2017   T-score -3.1  . DILATION AND CURETTAGE OF UTERUS  ? 1970's  . ESOPHAGOGASTRODUODENOSCOPY N/A 07/19/2014   Gastritis found + in the setting of supratherapeutic INR, +plavix, + meloxicam.  . EYE SURGERY Left 2012-2013   "injections for ~ 1 yr; don't really know what for" (07/12/2013)  . HEEL SPUR SURGERY Left 2008  . KNEE SURGERY  2005  . LEFT HEART CATHETERIZATION WITH CORONARY ANGIOGRAM N/A 01/30/2013   Procedure: LEFT HEART CATHETERIZATION WITH CORONARY ANGIOGRAM;  Surgeon: Clent Demark, MD;  Location: [facility] CATH LAB;  Service: Cardiovascular;  Laterality: N/A;  . PLANTAR FASCIA RELEASE Left 2008  . SPIROMETRY  04/25/14   In hosp for acute asthma/COPD flare: mixed obstructive and restrictive lung disease. The FEV1 is severely reduced at 45% predicted.  FEV1 signif decreased compared to prior spirometry 07/23/13.  . TENDON RELEASE  1996   Right forearm and hand  . TOTAL ABDOMINAL HYSTERECTOMY  1974  . TRANSTHORACIC ECHOCARDIOGRAM  01/2013; 04/2014;08/2015; 09/2017   2014--NORMAL.  2015--focal basal septal hypertrophy, EF 55-60%, grade I diast dysfxn, mild LAE.  08/2015 EF 55-60%, nl LV syst fxn, grade I DD, valves wnl. 10/21/17: EF 65-70%,  grd I DD, o/w normal.    MEDS:  Outpatient Medications Prior to Visit  Medication Sig Dispense Refill  . alendronate (FOSAMAX) 70 MG tablet Take 1 tablet (70 mg total) by mouth every 7 (seven) days. Take with a full glass of water on an empty stomach, first thing in the morning and remain up right for 30 minutes after taking. 12 tablet 3  . ALPRAZolam (XANAX) 1 MG tablet TAKE 1 TABLET THREE TIMES DAILY AS NEEDED FOR ANXIETY. 90 tablet 5  . aspirin 81 MG tablet Take 81 mg by mouth at bedtime.    . diclofenac sodium (VOLTAREN) 1 % GEL Apply 2 g topically 3 (three) times daily as needed (pain). 2 Tube 2  . donepezil (ARICEPT) 10 MG tablet Take 1 tablet (10 mg total) by mouth at bedtime. 30 tablet 6  . DULoxetine (CYMBALTA) 30 MG capsule TAKE 1 CAPSULE ONCE DAILY ALONG WITH 60MG  CAPSULE FOR TOTAL 90MG   DAILY. (Patient taking differently: Take 30 mg by mouth daily. Take with 60mg  to total 90mg  daily.) 90 capsule 1  . DULoxetine (CYMBALTA) 60 MG capsule TAKE 1 CAPSULE A DAY TO BE COMBINED WITH 30MG  CAPSULE (Patient taking differently: Take 60 mg by mouth daily. Take with 30mg  to total 90mg  daily.) 90 capsule 3  . fluconazole (DIFLUCAN) 100 MG tablet 1 tab po qd prn thrush (Patient taking differently: Take 100 mg by mouth daily as needed (Thrush). ) 60 tablet 1  . fluticasone (FLONASE) 50 MCG/ACT nasal spray Place 2 sprays into both nostrils daily. 16 g 11  . furosemide (LASIX) 40 MG tablet Take 1 tablet (40 mg total) by mouth daily as needed for fluid.    [name] Kitchen ipratropium (ATROVENT) 0.03 % nasal spray Place 2 sprays into both nostrils every 12 (twelve) hours. 30 mL 11  . isosorbide mononitrate (IMDUR) 30 MG 24 hr tablet TAKE 2 TABLETS IN THE AM AND 1 TABLET IN THE PM. (Patient taking differently: Take 30-60 mg by mouth See admin instructions. Take 60mg  in the AM and 30mg  in the PM.) 90 tablet 0  . lamoTRIgine (LAMICTAL) 150 MG tablet Take 1 tablet (150 mg total) by mouth daily. 30 tablet 1  . metoprolol  succinate (TOPROL-XL) 50 MG 24 hr tablet TAKE (1) TABLET DAILY IN THE MORNING. (Patient taking differently: Take 50 mg by mouth daily. ) 90 tablet 3  . minocycline (DYNACIN) 50 MG tablet Take 1 tablet (50 mg total) by mouth 2 (two) times daily. 60 tablet 1  . Multiple Vitamin (MULTIVITAMIN WITH MINERALS) TABS tablet Take 1 tablet by mouth daily.    . nitroGLYCERIN (NITROSTAT) 0.4 MG SL tablet Place 1 tablet (0.4 mg total) under the tongue every 5 (five) minutes as needed for chest pain (x 3 doses). 25 tablet 11  . ondansetron (ZOFRAN) 8 MG tablet Take 1 tablet (8 mg total) by mouth every 8 (eight) hours as needed for nausea or vomiting. 20 tablet 1  . oxyCODONE-acetaminophen (PERCOCET) 10-325 MG tablet TAKE (1) TABLET EVERY SIX HOURS AS NEEDED FOR PAIN. 100 tablet 0  . pantoprazole (PROTONIX) 40 MG tablet TAKE 1 TABLET BY MOUTH DAILY. (Patient taking differently: Take 40 mg by mouth daily. ) 90 tablet 0  . rosuvastatin (CRESTOR) 20 MG tablet Take 1 tablet (20 mg total) by mouth daily. 90 tablet 3  . traZODone (DESYREL) 50 MG tablet TAKE 2-4 TABLETS AT BEDTIME (Patient taking differently: Take 150 mg by mouth at bedtime as needed for sleep. ) 120 tablet 5  . Vitamin D, Ergocalciferol, (DRISDOL) 1.25 MG (50000 UT) CAPS capsule TAKE (1) CAPSULE TWICE A WEEK. (Patient taking differently: Take 50,000 Units by mouth 2 (two) times a week. ) 24 capsule 0   Facility-Administered Medications Prior to Visit  Medication Dose Route Frequency Provider Last Rate Last Dose  . ferumoxytol (FERAHEME) 510 mg in sodium chloride 0.9 % 100 mL IVPB  510 mg Intravenous Once Volanda Napoleon, MD        Pertinent labs/imaging   Chemistry      Component Value Date/Time   NA 140 10/07/2019 0715   NA 141 06/05/2015 1315   K 3.2 (L) 10/07/2019 0715   K 3.9 06/05/2015 1315   CL 101 10/07/2019 0715   CL 103 06/05/2015 1315   CO2 27 10/07/2019 0715   CO2 28 06/05/2015 1315   BUN 6 (L) 10/07/2019 0715   BUN 14 06/05/2015  1315   CREATININE 0.77 10/07/2019  0715   CREATININE 0.87 02/03/2017 1523      Component Value Date/Time   CALCIUM 9.1 10/07/2019 0715   CALCIUM 9.0 06/05/2015 1315   ALKPHOS 65 10/04/2019 1124   ALKPHOS 84 06/05/2015 1315   AST 22 10/04/2019 1124   AST 23 06/05/2015 1315   ALT 13 10/04/2019 1124   ALT 17 06/05/2015 1315   BILITOT 0.6 10/04/2019 1124   BILITOT 1.00 06/05/2015 1315     Lab Results  Component Value Date   WBC 6.0 10/07/2019   HGB 14.8 10/07/2019   HCT 45.5 10/07/2019   MCV 90.1 10/07/2019   PLT 216 10/07/2019   Lab Results  Component Value Date   HGBA1C 5.6 01/02/2016   Covid 19 NEG on 10/04/19  Lab Results  Component Value Date   CKTOTAL 80 10/05/2019   Lab Results  Component Value Date   TSH 2.750 10/04/2019   ASSESSMENT/PLAN:  ***  {Medical decision making of moderate complexity was utilized today} 99495  {Medical decision making of high complexity was utilized today} [id number]  FOLLOW UP:  ***  Signed:  Crissie Sickles, MD           10/14/2019

## 2019-10-20 ENCOUNTER — Other Ambulatory Visit: Payer: Self-pay

## 2019-10-20 ENCOUNTER — Encounter: Payer: Self-pay | Admitting: Physical Medicine & Rehabilitation

## 2019-10-20 ENCOUNTER — Encounter (HOSPITAL_BASED_OUTPATIENT_CLINIC_OR_DEPARTMENT_OTHER): Payer: Medicare Other | Admitting: Physical Medicine & Rehabilitation

## 2019-10-20 VITALS — BP 128/70 | HR 78 | Temp 97.5°F | Ht 64.0 in | Wt 155.0 lb

## 2019-10-20 DIAGNOSIS — R2689 Other abnormalities of gait and mobility: Secondary | ICD-10-CM | POA: Diagnosis not present

## 2019-10-20 DIAGNOSIS — M797 Fibromyalgia: Secondary | ICD-10-CM | POA: Diagnosis not present

## 2019-10-20 DIAGNOSIS — M1711 Unilateral primary osteoarthritis, right knee: Secondary | ICD-10-CM | POA: Diagnosis not present

## 2019-10-20 DIAGNOSIS — Z5181 Encounter for therapeutic drug level monitoring: Secondary | ICD-10-CM | POA: Diagnosis not present

## 2019-10-20 DIAGNOSIS — M1712 Unilateral primary osteoarthritis, left knee: Secondary | ICD-10-CM | POA: Diagnosis not present

## 2019-10-20 DIAGNOSIS — Z79899 Other long term (current) drug therapy: Secondary | ICD-10-CM | POA: Diagnosis not present

## 2019-10-20 MED ORDER — OXYCODONE-ACETAMINOPHEN 10-325 MG PO TABS: ORAL_TABLET | ORAL | 0 refills | Status: DC

## 2019-10-20 MED ORDER — DICLOFENAC SODIUM 1 % TD GEL: 2.0000 g | Freq: Four times a day (QID) | TRANSDERMAL | 4 refills | Status: DC

## 2019-10-20 NOTE — Progress Notes (Signed)
PROCEDURE NOTE  DIAGNOSIS:  endstage OA of bilateral knees  INTERVENTION:   ZILRETTA INJECTION     After informed consent and preparation of the skin with betadine and isopropyl alcohol, I injected 32MG  of zilretta dissolved into 5cc of diluent into the bilateral knees via anterolateral approach. Contents of syring were shaken vigorously and aspiration was performed prior to injection. The patient tolerated well, and no complications were encountered. Afterward the area was cleaned and dressed. Post- injection instructions were provided including ice  if swelling or pain should occur.   She'll see NP Danella Sensing in about 6-7 weeks time. I refilled voltaren gel today   Meredith Staggers, MD, [facility] 10/20/2019

## 2019-10-20 NOTE — Patient Instructions (Signed)
PLEASE FEEL FREE TO CALL OUR OFFICE WITH ANY PROBLEMS OR QUESTIONS ([phone])      

## 2019-10-22 ENCOUNTER — Encounter: Payer: Self-pay | Admitting: Family Medicine

## 2019-10-22 ENCOUNTER — Other Ambulatory Visit: Payer: Self-pay

## 2019-10-22 ENCOUNTER — Ambulatory Visit (INDEPENDENT_AMBULATORY_CARE_PROVIDER_SITE_OTHER): Payer: Medicare Other | Admitting: Family Medicine

## 2019-10-22 ENCOUNTER — Telehealth: Payer: Self-pay

## 2019-10-22 VITALS — BP 124/77 | HR 72 | Temp 98.4°F | Resp 16 | Ht 64.0 in | Wt 155.0 lb

## 2019-10-22 DIAGNOSIS — E673 Hypervitaminosis D: Secondary | ICD-10-CM | POA: Diagnosis not present

## 2019-10-22 DIAGNOSIS — N390 Urinary tract infection, site not specified: Secondary | ICD-10-CM

## 2019-10-22 DIAGNOSIS — I959 Hypotension, unspecified: Secondary | ICD-10-CM | POA: Diagnosis not present

## 2019-10-22 DIAGNOSIS — E876 Hypokalemia: Secondary | ICD-10-CM | POA: Diagnosis not present

## 2019-10-22 DIAGNOSIS — I2510 Atherosclerotic heart disease of native coronary artery without angina pectoris: Secondary | ICD-10-CM | POA: Diagnosis not present

## 2019-10-22 DIAGNOSIS — E860 Dehydration: Secondary | ICD-10-CM

## 2019-10-22 DIAGNOSIS — N3000 Acute cystitis without hematuria: Secondary | ICD-10-CM

## 2019-10-22 DIAGNOSIS — E7800 Pure hypercholesterolemia, unspecified: Secondary | ICD-10-CM

## 2019-10-22 LAB — BASIC METABOLIC PANEL
BUN: 13 mg/dL (ref 6–23)
CO2: 27 mEq/L (ref 19–32)
Calcium: 8.6 mg/dL (ref 8.4–10.5)
Chloride: 102 mEq/L (ref 96–112)
Creatinine, Ser: 0.73 mg/dL (ref 0.40–1.20)
GFR: 78.02 mL/min (ref >60.00)
Glucose, Bld: 97 mg/dL (ref 70–99)
Potassium: 4 mEq/L (ref 3.5–5.1)
Sodium: 138 mEq/L (ref 135–145)

## 2019-10-22 LAB — VITAMIN D 25 HYDROXY (VIT D DEFICIENCY, FRACTURES): VITD: 87.03 ng/mL (ref 30.00–100.00)

## 2019-10-22 MED ORDER — CIPROFLOXACIN HCL 250 MG PO TABS: 250.0000 mg | ORAL_TABLET | Freq: Two times a day (BID) | ORAL | 3 refills | Status: DC

## 2019-10-22 MED ORDER — ONDANSETRON HCL 8 MG PO TABS: 8.0000 mg | ORAL_TABLET | Freq: Three times a day (TID) | ORAL | 6 refills | Status: DC | PRN

## 2019-10-22 MED ORDER — LUBIPROSTONE 24 MCG PO CAPS: 24.0000 ug | ORAL_CAPSULE | Freq: Two times a day (BID) | ORAL | 1 refills | Status: DC

## 2019-10-22 MED ORDER — SOLIFENACIN SUCCINATE 5 MG PO TABS: 5.0000 mg | ORAL_TABLET | Freq: Every day | ORAL | 1 refills | Status: DC

## 2019-10-22 NOTE — Telephone Encounter (Signed)
Pt was unable to get urine at visit. Driver of [facility] assisted living stated they could bring sample back but would not be until next week. Per Dr Anitra Lauth pt is not to get sample and start abx. Pt was called on cell phone and detailed message was left letting patient know we did not sample and abx had been sent to pharmacy and to begin taking that.

## 2019-10-22 NOTE — Progress Notes (Signed)
10/22/2019  CC:  Chief Complaint  Patient presents with  . Hospitalization Follow-up    Patient is a 73 y.o. Caucasian female who presents for  hospital follow up, specifically [facility] face-to-face visit. Dates hospitalized: 10/12-10/15, 2020. Days since d/c from hospital: 15 total (11 business days). Patient was discharged from hospital to home. Reason for admission to hospital: hypotension, UTI dx'd in ED->admitted for IVF and IV abx, obs. Date of interactive (phone) contact with patient and/or caregiver: 10/11/19  I have reviewed patient's discharge summary plus pertinent specific notes, labs, and imaging from the hospitalization.   Now had some burning w/urination and odor that got better but now she feels like it has returned, urgency and incontinence is worse than usual.  No blood in urine.  No fevers.  No flank pain. Appetite up and down.  Quite a bit of nausea, vomits approx 2 x/day.  Not necessarily postprandial sx's.  Says chronic severe pain often makes her n/v come on. Working on nutrition, also working on good fluid intake. Has not been taking lasix, only takes occ for LE swelling.   Noted in d/c summary:  "10/14: C/o of pain in knees and dehydration. 10/15: C/o nursing interaction ON and slamming of doors. C/o was not getting home meds". Per documentation, pt was set to leave on 10/07/19 regardless of whether the MD was going to discharge her or not.  It did turn out that the MD felt she was appropriate d/c home that day. D/c'd home to finish 3 more days of keflex (which her urine c/s showed was an appropriate antibiotic).   She fell in early morning hours yesterday when she got up to go to the bathroom. Lost balance, hit a piece of furnature and then into a door, then bouced into an armour, then hit floor. Hit head on the heavy piece of cherry wood furniture.  No head lac, no LOC.   She lost her bladder contents.  Hurting ribs.  This is her first  fall in the last 18 mo or so. She is feeling soreness but not severe pain anywhere.  No focal weakness, no paresthesias, no cognitive problems, no vision or hearing problems.  No swallowing or speech c/o.  Knee pain/stiffness is her biggest problem affecting her gait/instability. However, her knees are feeling better since getting more injections in them 2 d/a. She does not want HH PT.  Chronic constipation, worsened the last several years due to opioid use chronically. Says her pain mgmt MD was going to prescribe a constip med that she doesn't recall the name of but this got overlooked at last f/u visit.  Medication reconciliation was done today and patient is taking meds as recommended by discharging hospitalist/specialist.    PMH:  Past Medical History:  Diagnosis Date  . Acute upper GI bleed 06/2014   while pt taking coumadin, plavix, and meloxicam---despite being told not to take coumadin.  . Anginal pain ([place])    Nonobstructive CAD 2014; however, her cardiologist put her on a statin for this and NOT for hyperlipidemia per pt report.  Atyp CP 08/2017 at card f/u, plan for myoc perf imaging.  . Anxiety    panic attacks  . Asthma    w/ asbestososis   . BPPV (benign paroxysmal positional vertigo) 12/16/2012  . Chronic diastolic CHF (congestive heart failure) (HCC)    dry wt as of 11/06/16 is 168 lbs.  . Chronic lower back pain   . COPD (chronic obstructive pulmonary disease) (  [place])   . DDD (degenerative disc disease)    lumbar and cervical.   . Diverticular disease   . Fibromyalgia    Patient states dx was around her late 9s but she had sx's for years prior to this.  . H/O hiatal hernia   . Hay fever   . History of pneumonia    hospitalized 12/2011, 02/2013, and 07/2013 [facility]) for this  . HTN (hypertension)    Renal artery dopplers 04/2013 neg for stenosis.  . Hypervitaminosis D 09/27/2019   over-supplemented.  Stopped vit D and plan recheck 2 mo.  . Idiopathic  angio-edema-urticaria 72014   Angioedema component was very minimal  . Insomnia   . Iron deficiency anemia    Hematologist in [place], [place] did extensive w/u; no cause found; failed oral supplement;; gets fairly regular (q41m or so) IV iron infusions (Venofer -iron sucrose- 200mg  with procrit.  "for 14 yr I've been getting blood work q month & getting infusions prn" (07/12/2013).  Dr. Marin Olp locally, iron infusions done, EPO deficiency dx'd  . Migraine syndrome    "not as often anymore; used to be ~ q wk" (07/12/2013)  . Mixed incontinence urge and stress   . Nephrolithiasis    "passed all on my own or they are still in there" (07/12/2013)  . Neuroleptic induced Parkinsonism ([place]) 2018   Dr. Carles Collet, neuro, saw her 11/24/17 and recommended d/c of abilify as first step.  D/c'd abilify and pt got complete recovery.  . OSA on CPAP    prior to move to [employer]--had another sleep study 10/2015 w/pulm Dr. Camillo Flaming.  . Osteoarthritis    "severe; progressing fast" (07/12/2013); multiple joints-not surgical candidate for TKR (03/2015).  Triamcinolon knee injections by Dr. Tessa Lerner 12/2017.  [name] Kitchen Pernicious anemia 08/24/2014  . Pleural plaque with presence of asbestos 07/22/2013  . Pulmonary embolism ([place]) 07/2013   Dx at [facility] with very small peripheral upper lobe pe 07/2013: pt took coumadin for about 8-9 mo  . Pyelonephritis    "several times over the last 30 yr" (07/12/2013)  . RBBB (right bundle branch block)   . Recurrent major depression ([place])   . Recurrent UTI    hx of hospitalization for pyelonephritis; started abx prophylaxis 06/2015  . Syncope    Hypotensive; ED visit--Dr. Terrence Dupont did Cath--nonobstructive CAD, EF 55-60%.  In retrospect, suspect pt rx med misuse/polypharmacy  . Tension headache, chronic     PSH:  Past Surgical History:  Procedure Laterality Date  . APPENDECTOMY  1960  . AXILLARY SURGERY Left 1978   Multiple "lump" in armpit per pt  . CARDIAC CATHETERIZATION  01/2013   nonobstructive CAD, EF  55-60%  . CARDIOVASCULAR STRESS TEST  02/22/15   Low risk myocard perf imaging; wall motion normal, normal EF  . carotid duplex doppler  10/21/2017   R vertebral flow suggestive of possible distal obstruction.  Pt declined further w/u as of 10/29/17 but need to revisit this problem periodically.  . COCCYX REMOVAL  1972  . DEXA  06/05/2017   T-score -3.1  . DILATION AND CURETTAGE OF UTERUS  ? 1970's  . ESOPHAGOGASTRODUODENOSCOPY N/A 07/19/2014   Gastritis found + in the setting of supratherapeutic INR, +plavix, + meloxicam.  . EYE SURGERY Left 2012-2013   "injections for ~ 1 yr; don't really know what for" (07/12/2013)  . HEEL SPUR SURGERY Left 2008  . KNEE SURGERY  2005  . LEFT HEART CATHETERIZATION WITH CORONARY ANGIOGRAM N/A 01/30/2013   Procedure: LEFT HEART  CATHETERIZATION WITH CORONARY ANGIOGRAM;  Surgeon: Clent Demark, MD;  Location: [facility] CATH LAB;  Service: Cardiovascular;  Laterality: N/A;  . PLANTAR FASCIA RELEASE Left 2008  . SPIROMETRY  04/25/14   In hosp for acute asthma/COPD flare: mixed obstructive and restrictive lung disease. The FEV1 is severely reduced at 45% predicted.  FEV1 signif decreased compared to prior spirometry 07/23/13.  . TENDON RELEASE  1996   Right forearm and hand  . TOTAL ABDOMINAL HYSTERECTOMY  1974  . TRANSTHORACIC ECHOCARDIOGRAM  01/2013; 04/2014;08/2015; 09/2017   2014--NORMAL.  2015--focal basal septal hypertrophy, EF 55-60%, grade I diast dysfxn, mild LAE.  08/2015 EF 55-60%, nl LV syst fxn, grade I DD, valves wnl. 10/21/17: EF 65-70%, grd I DD, o/w normal.   Review of Systems  Constitutional: Positive for fatigue. Negative for fever.  HENT: Negative for congestion and sore throat.   Eyes: Negative for visual disturbance.  Respiratory: Negative for cough.   Cardiovascular: Negative for chest pain.  Gastrointestinal: Negative for abdominal pain and nausea.  Genitourinary: Positive for dysuria, frequency and urgency.  Musculoskeletal: Negative for back pain  and joint swelling.  Skin: Negative for rash.  Neurological: Negative for weakness and headaches.  Hematological: Negative for adenopathy.  Psychiatric/Behavioral: Negative for confusion.    MEDS:  Outpatient Medications Prior to Visit  Medication Sig Dispense Refill  . alendronate (FOSAMAX) 70 MG tablet Take 1 tablet (70 mg total) by mouth every 7 (seven) days. Take with a full glass of water on an empty stomach, first thing in the morning and remain up right for 30 minutes after taking. 12 tablet 3  . ALPRAZolam (XANAX) 1 MG tablet TAKE 1 TABLET THREE TIMES DAILY AS NEEDED FOR ANXIETY. 90 tablet 5  . aspirin 81 MG tablet Take 81 mg by mouth at bedtime.    . diclofenac sodium (VOLTAREN) 1 % GEL Apply 2 g topically 4 (four) times daily. 350 g 4  . donepezil (ARICEPT) 10 MG tablet Take 1 tablet (10 mg total) by mouth at bedtime. 30 tablet 6  . DULoxetine (CYMBALTA) 30 MG capsule TAKE 1 CAPSULE ONCE DAILY ALONG WITH 60MG  CAPSULE FOR TOTAL 90MG  DAILY. (Patient taking differently: Take 30 mg by mouth daily. Take with 60mg  to total 90mg  daily.) 90 capsule 1  . DULoxetine (CYMBALTA) 60 MG capsule TAKE 1 CAPSULE A DAY TO BE COMBINED WITH 30MG  CAPSULE (Patient taking differently: Take 60 mg by mouth daily. Take with 30mg  to total 90mg  daily.) 90 capsule 3  . fluconazole (DIFLUCAN) 100 MG tablet 1 tab po qd prn thrush (Patient taking differently: Take 100 mg by mouth daily as needed (Thrush). ) 60 tablet 1  . fluticasone (FLONASE) 50 MCG/ACT nasal spray Place 2 sprays into both nostrils daily. 16 g 11  . furosemide (LASIX) 40 MG tablet Take 1 tablet (40 mg total) by mouth daily as needed for fluid.    [name] Kitchen ipratropium (ATROVENT) 0.03 % nasal spray Place 2 sprays into both nostrils every 12 (twelve) hours. 30 mL 11  . isosorbide mononitrate (IMDUR) 30 MG 24 hr tablet TAKE 2 TABLETS IN THE AM AND 1 TABLET IN THE PM. (Patient taking differently: Take 30-60 mg by mouth See admin instructions. Take 60mg  in the  AM and 30mg  in the PM.) 90 tablet 0  . lamoTRIgine (LAMICTAL) 150 MG tablet Take 1 tablet (150 mg total) by mouth daily. 30 tablet 1  . metoprolol succinate (TOPROL-XL) 50 MG 24 hr tablet TAKE (1) TABLET DAILY IN THE  MORNING. (Patient taking differently: Take 50 mg by mouth daily. ) 90 tablet 3  . minocycline (DYNACIN) 50 MG tablet Take 1 tablet (50 mg total) by mouth 2 (two) times daily. 60 tablet 1  . Multiple Vitamin (MULTIVITAMIN WITH MINERALS) TABS tablet Take 1 tablet by mouth daily.    . nitroGLYCERIN (NITROSTAT) 0.4 MG SL tablet Place 1 tablet (0.4 mg total) under the tongue every 5 (five) minutes as needed for chest pain (x 3 doses). 25 tablet 11  . ondansetron (ZOFRAN) 8 MG tablet Take 1 tablet (8 mg total) by mouth every 8 (eight) hours as needed for nausea or vomiting. 20 tablet 1  . oxyCODONE-acetaminophen (PERCOCET) 10-325 MG tablet TAKE (1) TABLET EVERY SIX HOURS AS NEEDED FOR PAIN. 100 tablet 0  . pantoprazole (PROTONIX) 40 MG tablet TAKE 1 TABLET BY MOUTH DAILY. (Patient taking differently: Take 40 mg by mouth daily. ) 90 tablet 0  . rosuvastatin (CRESTOR) 20 MG tablet Take 1 tablet (20 mg total) by mouth daily. 90 tablet 3  . traZODone (DESYREL) 50 MG tablet TAKE 2-4 TABLETS AT BEDTIME (Patient taking differently: Take 150 mg by mouth at bedtime as needed for sleep. ) 120 tablet 5  . Vitamin D, Ergocalciferol, (DRISDOL) 1.25 MG (50000 UT) CAPS capsule TAKE (1) CAPSULE TWICE A WEEK. (Patient taking differently: Take 50,000 Units by mouth 2 (two) times a week. ) 24 capsule 0   Facility-Administered Medications Prior to Visit  Medication Dose Route Frequency Provider Last Rate Last Dose  . ferumoxytol (FERAHEME) 510 mg in sodium chloride 0.9 % 100 mL IVPB  510 mg Intravenous Once Ennever, Rudell Cobb, MD       EXAM: BP 124/77 (BP Location: Left Arm, Patient Position: Sitting, Cuff Size: Normal)   Pulse 72   Temp 98.4 F (36.9 C) (Temporal)   Resp 16   Ht 5\' 4"  (1.626 m)   Wt 155 lb  (70.3 kg)   SpO2 92%   BMI 26.61 kg/m  Gen: Alert, well appearing.  Patient is oriented to person, place, time, and situation. Using rolling walker, ambulates slowly. Grunts in pain quite a bit when she tries to get up from seated position and when walking. [id number]: no injection, icteris, swelling, or exudate.  EOMI, PERRLA. Mouth: lips without lesion/swelling.  Oral mucosa pink and moist. Oropharynx without erythema, exudate, or swelling.  CV: RRR, no murmur LUNGS: Clear, nonlabored resps. ABD: soft NT/ND EXT: no c/c/e She is diffusely TTP from her recent fall.  Pertinent labs/imaging Lab Results  Component Value Date   TSH 2.750 10/04/2019   Lab Results  Component Value Date   WBC 6.0 10/07/2019   HGB 14.8 10/07/2019   HCT 45.5 10/07/2019   MCV 90.1 10/07/2019   PLT 216 10/07/2019   Lab Results  Component Value Date   CREATININE 0.77 10/07/2019   BUN 6 (L) 10/07/2019   NA 140 10/07/2019   K 3.2 (L) 10/07/2019   CL 101 10/07/2019   CO2 27 10/07/2019   Lab Results  Component Value Date   ALT 13 10/04/2019   AST 22 10/04/2019   ALKPHOS 65 10/04/2019   BILITOT 0.6 10/04/2019   Lab Results  Component Value Date   CHOL 172 09/22/2018   Lab Results  Component Value Date   HDL 43.70 09/22/2018   Lab Results  Component Value Date   LDLCALC 92 09/22/2018   Lab Results  Component Value Date   TRIG 184.0 (H) 09/22/2018   Lab  Results  Component Value Date   CHOLHDL 4 09/22/2018   Lab Results  Component Value Date   HGBA1C 5.6 01/02/2016   09/27/19: vitamin D 102.  10/04/19: SARS CORONAVIRUS: NEGATIVE  ASSESSMENT/PLAN:  1) UTI, possible sepsis.  +Hx of recurrent UTI (some causing her to be quite ill). Her presenting issue this time was hypotension/dehydration, but she seems to endorse some UTI sx's at the time as well.  She received appropriate treatment but she says dysuria, foul smelling urine, and urge incontinence have returned to mild degree after  getting moderately better.  She is most bothered by her incontinence, which is a long term problem which she has not been on meds for. She was unable to give urine specimen today. Started daily cipro 250mg  for UTI prophylaxis.  2) Urge/stress incontinence.  We'll try to help with use of vesicare 5mg , 1 qd.  3) Chronic constipation, mostly from ongoing opioid use for her pain. Start trial of amitiza 24 mcg bid.  4) Elevated vit D level; was on high dose supplement twice per week. Recheck vit D today.  5) Hypokalemia, mild. This was on last day of hospitalization. Repeat lytes/cr today.  Medical decision making of moderate complexity was utilized today.  FOLLOW UP:  3-4 wks f/u urinary incont and constipation, UTIs  Signed:  Crissie Sickles, MD           10/22/2019

## 2019-10-25 ENCOUNTER — Telehealth: Payer: Self-pay | Admitting: Family Medicine

## 2019-10-25 ENCOUNTER — Telehealth: Payer: Self-pay

## 2019-10-25 DIAGNOSIS — M6281 Muscle weakness (generalized): Secondary | ICD-10-CM | POA: Diagnosis not present

## 2019-10-25 NOTE — Telephone Encounter (Signed)
Provider will fax physical therapy and occupational therapy orders to be signed. Please send back ASAP.

## 2019-10-25 NOTE — Telephone Encounter (Signed)
Started and sent a PA through [employer].    KeyTed Mcalpine PA Case ID: [id number]  BIN: [id number] PCN: [id number] Group: [id number]  Medication: Amitiza 24 mcg   Will check the status in 24-48 hours.   MEDICATION APPROVED

## 2019-10-25 NOTE — Telephone Encounter (Signed)
Orders were faxed back today.

## 2019-10-26 DIAGNOSIS — M6281 Muscle weakness (generalized): Secondary | ICD-10-CM | POA: Diagnosis not present

## 2019-10-27 DIAGNOSIS — M6281 Muscle weakness (generalized): Secondary | ICD-10-CM | POA: Diagnosis not present

## 2019-10-28 DIAGNOSIS — M6281 Muscle weakness (generalized): Secondary | ICD-10-CM | POA: Diagnosis not present

## 2019-11-01 DIAGNOSIS — M6281 Muscle weakness (generalized): Secondary | ICD-10-CM | POA: Diagnosis not present

## 2019-11-02 ENCOUNTER — Telehealth: Payer: Self-pay

## 2019-11-02 DIAGNOSIS — M6281 Muscle weakness (generalized): Secondary | ICD-10-CM | POA: Diagnosis not present

## 2019-11-02 NOTE — Telephone Encounter (Signed)
Pt left a message regarding rib pain. She had a fall on 10/29 and pain is worsened since visit on 10/30. She wanted to know if you would put in a order for her to have an XR or CT scan of area.  Please advise, thanks.

## 2019-11-03 ENCOUNTER — Encounter: Payer: Medicare Other | Admitting: Physical Medicine & Rehabilitation

## 2019-11-03 ENCOUNTER — Other Ambulatory Visit: Payer: Self-pay | Admitting: Family Medicine

## 2019-11-03 DIAGNOSIS — W19XXXA Unspecified fall, initial encounter: Secondary | ICD-10-CM

## 2019-11-03 DIAGNOSIS — R0781 Pleurodynia: Secondary | ICD-10-CM

## 2019-11-03 DIAGNOSIS — M6281 Muscle weakness (generalized): Secondary | ICD-10-CM | POA: Diagnosis not present

## 2019-11-03 NOTE — Telephone Encounter (Signed)
Will order x-ray of ribs now ([facility]).

## 2019-11-03 NOTE — Telephone Encounter (Signed)
Patient was advised and provided office number for that location.

## 2019-11-05 ENCOUNTER — Other Ambulatory Visit: Payer: Self-pay

## 2019-11-05 ENCOUNTER — Ambulatory Visit (HOSPITAL_BASED_OUTPATIENT_CLINIC_OR_DEPARTMENT_OTHER)
Admission: RE | Admit: 2019-11-05 | Discharge: 2019-11-05 | Disposition: A | Discharge status: Home / Self Care | Payer: Medicare Other | Source: Ambulatory Visit | Attending: Family Medicine | Admitting: Family Medicine

## 2019-11-05 ENCOUNTER — Telehealth: Payer: Self-pay | Admitting: *Deleted

## 2019-11-05 DIAGNOSIS — R0781 Pleurodynia: Secondary | ICD-10-CM

## 2019-11-05 DIAGNOSIS — W19XXXA Unspecified fall, initial encounter: Secondary | ICD-10-CM | POA: Diagnosis not present

## 2019-11-05 NOTE — Telephone Encounter (Signed)
Linda [name] left a message stating they have a script for pain medication that stipulates do not fill until tomorrow 11/06/2019.  Patient has meds delivered to her home.  They are asking for permission to fill 1 day early to facilitate.

## 2019-11-05 NOTE — Telephone Encounter (Signed)
This Provider placed a call to [facility], spoke with the pharmacist. She was given permission for early refill. She verbalizes understanding.

## 2019-11-08 DIAGNOSIS — M6281 Muscle weakness (generalized): Secondary | ICD-10-CM | POA: Diagnosis not present

## 2019-11-11 DIAGNOSIS — M6281 Muscle weakness (generalized): Secondary | ICD-10-CM | POA: Diagnosis not present

## 2019-11-12 DIAGNOSIS — M6281 Muscle weakness (generalized): Secondary | ICD-10-CM | POA: Diagnosis not present

## 2019-11-22 ENCOUNTER — Other Ambulatory Visit: Payer: Self-pay | Admitting: Family Medicine

## 2019-11-22 ENCOUNTER — Other Ambulatory Visit: Payer: Self-pay | Admitting: Cardiology

## 2019-11-22 DIAGNOSIS — M6281 Muscle weakness (generalized): Secondary | ICD-10-CM | POA: Diagnosis not present

## 2019-11-22 NOTE — Telephone Encounter (Signed)
RF request for lamictal to be filled at [facility] 09/27/2019 Next OV 11/29/2019 Last RX 09/27/2019 # 30 x 1 rf.  Please advise.

## 2019-11-29 ENCOUNTER — Ambulatory Visit (INDEPENDENT_AMBULATORY_CARE_PROVIDER_SITE_OTHER): Payer: Medicare Other | Admitting: Family Medicine

## 2019-11-29 ENCOUNTER — Other Ambulatory Visit: Payer: Self-pay

## 2019-11-29 ENCOUNTER — Encounter: Payer: Self-pay | Admitting: Family Medicine

## 2019-11-29 VITALS — BP 160/90 | HR 80

## 2019-11-29 DIAGNOSIS — G3184 Mild cognitive impairment, so stated: Secondary | ICD-10-CM | POA: Diagnosis not present

## 2019-11-29 DIAGNOSIS — R0989 Other specified symptoms and signs involving the circulatory and respiratory systems: Secondary | ICD-10-CM

## 2019-11-29 DIAGNOSIS — F411 Generalized anxiety disorder: Secondary | ICD-10-CM | POA: Diagnosis not present

## 2019-11-29 DIAGNOSIS — R278 Other lack of coordination: Secondary | ICD-10-CM | POA: Diagnosis not present

## 2019-11-29 DIAGNOSIS — L719 Rosacea, unspecified: Secondary | ICD-10-CM

## 2019-11-29 DIAGNOSIS — R0600 Dyspnea, unspecified: Secondary | ICD-10-CM

## 2019-11-29 DIAGNOSIS — R0609 Other forms of dyspnea: Secondary | ICD-10-CM

## 2019-11-29 DIAGNOSIS — F3340 Major depressive disorder, recurrent, in remission, unspecified: Secondary | ICD-10-CM

## 2019-11-29 DIAGNOSIS — I2510 Atherosclerotic heart disease of native coronary artery without angina pectoris: Secondary | ICD-10-CM

## 2019-11-29 DIAGNOSIS — I10 Essential (primary) hypertension: Secondary | ICD-10-CM | POA: Diagnosis not present

## 2019-11-29 MED ORDER — METRONIDAZOLE 0.75 % EX GEL: 1.0000 "application " | Freq: Two times a day (BID) | CUTANEOUS | 3 refills | Status: DC

## 2019-11-29 NOTE — Progress Notes (Signed)
Virtual Visit via Video Note  I connected with pt on 11/29/19 at  1:00 PM EST by a video enabled telemedicine application and verified that I am speaking with the correct person using two identifiers.  Location patient: home Location provider:work or home office Persons participating in the virtual visit: patient, provider  I discussed the limitations of evaluation and management by telemedicine and the availability of in person appointments. The patient expressed understanding and agreed to proceed.  Telemedicine visit is a necessity given the COVID-19 restrictions in place at the current time.  HPI: 73 y/o WF being seen today for f/u 2 mo f/u MDD/GAD, rosacea, and MCI. Since last visit pt called to request increase in aricept to 10mg  qd. Started minocycline 50mg  bid for rosacea.  Interim hx: Doing pretty well. BP up 180/125 at times, several days this has happened.  Some normal as well.   Feels some DOE more than her normal.  No signif coughing or wheezing. Can't finish sentences quite like she used to. Ribs contusions sustained recently have impaired her ability to take deep breaths or bend in certain ways Due to the rib discomfort.  Some aching of LL's but no swellng. Some balance probs, some probs writing, has fallen some.  Some times when these sx's are not a problem.  No tremors, lip smacking, or dystonia. No low bp problems lately. NO new medications.  Increased dose of aricept never started b/c pharmacy sent 5mg  dose to her again.  No tremors. No signif change in mood/anxiety.  Compliant with cymbalta 90mg  qd and lamictal 150 mg qd + alprazolam 1mg  tid.  Says rosacea not improved any on minocycline.  Possibly even more problems No itching or burning.  ROS: See pertinent positives and negatives per HPI.  Past Medical History:  Diagnosis Date  . Acute upper GI bleed 06/2014   while pt taking coumadin, plavix, and meloxicam---despite being told not to take coumadin.  .  Anginal pain ([place])    Nonobstructive CAD 2014; however, her cardiologist put her on a statin for this and NOT for hyperlipidemia per pt report.  Atyp CP 08/2017 at card f/u, plan for myoc perf imaging.  . Anxiety    panic attacks  . Asthma    w/ asbestososis   . BPPV (benign paroxysmal positional vertigo) 12/16/2012  . Chronic diastolic CHF (congestive heart failure) (HCC)    dry wt as of 11/06/16 is 168 lbs.  . Chronic lower back pain   . COPD (chronic obstructive pulmonary disease) ([place])   . DDD (degenerative disc disease)    lumbar and cervical.   . Diverticular disease   . Fibromyalgia    Patient states dx was around her late 65s but she had sx's for years prior to this.  . H/O hiatal hernia   . Hay fever   . History of pneumonia    hospitalized 12/2011, 02/2013, and 07/2013 [facility]) for this  . HTN (hypertension)    Renal artery dopplers 04/2013 neg for stenosis.  . Hypervitaminosis D 09/27/2019   over-supplemented.  Stopped vit D and plan recheck 2 mo.  . Idiopathic angio-edema-urticaria 72014   Angioedema component was very minimal  . Insomnia   . Iron deficiency anemia    Hematologist in [place], [place] did extensive w/u; no cause found; failed oral supplement;; gets fairly regular (q3m or so) IV iron infusions (Venofer -iron sucrose- 200mg  with procrit.  "for 14 yr I've been getting blood work q month & getting infusions prn" (  07/12/2013).  Dr. Marin Olp locally, iron infusions done, EPO deficiency dx'd  . Migraine syndrome    "not as often anymore; used to be ~ q wk" (07/12/2013)  . Mixed incontinence urge and stress   . Nephrolithiasis    "passed all on my own or they are still in there" (07/12/2013)  . Neuroleptic induced Parkinsonism ([place]) 2018   Dr. Carles Collet, neuro, saw her 11/24/17 and recommended d/c of abilify as first step.  D/c'd abilify and pt got complete recovery.  . OSA on CPAP    prior to move to [place]--had another sleep study 10/2015 w/pulm Dr. Camillo Flaming.  . Osteoarthritis     "severe; progressing fast" (07/12/2013); multiple joints-not surgical candidate for TKR (03/2015).  Triamcinolon knee injections by Dr. Tessa Lerner 12/2017.  [name] Kitchen Pernicious anemia 08/24/2014  . Pleural plaque with presence of asbestos 07/22/2013  . Pulmonary embolism ([place]) 07/2013   Dx at [facility] with very small peripheral upper lobe pe 07/2013: pt took coumadin for about 8-9 mo  . Pyelonephritis    "several times over the last 30 yr" (07/12/2013)  . RBBB (right bundle branch block)   . Recurrent major depression ([place])   . Recurrent UTI    hx of hospitalization for pyelonephritis; started abx prophylaxis 06/2015  . Syncope    Hypotensive; ED visit--Dr. Terrence Dupont did Cath--nonobstructive CAD, EF 55-60%.  In retrospect, suspect pt rx med misuse/polypharmacy  . Tension headache, chronic     Past Surgical History:  Procedure Laterality Date  . APPENDECTOMY  1960  . AXILLARY SURGERY Left 1978   Multiple "lump" in armpit per pt  . CARDIAC CATHETERIZATION  01/2013   nonobstructive CAD, EF 55-60%  . CARDIOVASCULAR STRESS TEST  02/22/15   Low risk myocard perf imaging; wall motion normal, normal EF  . carotid duplex doppler  10/21/2017   R vertebral flow suggestive of possible distal obstruction.  Pt declined further w/u as of 10/29/17 but need to revisit this problem periodically.  . COCCYX REMOVAL  1972  . DEXA  06/05/2017   T-score -3.1  . DILATION AND CURETTAGE OF UTERUS  ? 1970's  . ESOPHAGOGASTRODUODENOSCOPY N/A 07/19/2014   Gastritis found + in the setting of supratherapeutic INR, +plavix, + meloxicam.  . EYE SURGERY Left 2012-2013   "injections for ~ 1 yr; don't really know what for" (07/12/2013)  . HEEL SPUR SURGERY Left 2008  . KNEE SURGERY  2005  . LEFT HEART CATHETERIZATION WITH CORONARY ANGIOGRAM N/A 01/30/2013   Procedure: LEFT HEART CATHETERIZATION WITH CORONARY ANGIOGRAM;  Surgeon: Clent Demark, MD;  Location: [facility] CATH LAB;  Service: Cardiovascular;  Laterality: N/A;  . PLANTAR FASCIA RELEASE Left  2008  . SPIROMETRY  04/25/14   In hosp for acute asthma/COPD flare: mixed obstructive and restrictive lung disease. The FEV1 is severely reduced at 45% predicted.  FEV1 signif decreased compared to prior spirometry 07/23/13.  . TENDON RELEASE  1996   Right forearm and hand  . TOTAL ABDOMINAL HYSTERECTOMY  1974  . TRANSTHORACIC ECHOCARDIOGRAM  01/2013; 04/2014;08/2015; 09/2017   2014--NORMAL.  2015--focal basal septal hypertrophy, EF 55-60%, grade I diast dysfxn, mild LAE.  08/2015 EF 55-60%, nl LV syst fxn, grade I DD, valves wnl. 10/21/17: EF 65-70%, grd I DD, o/w normal.    Family History  Problem Relation Age of Onset  . Arthritis Mother   . Kidney disease Mother   . Heart disease Father   . Stroke Father   . Hypertension Father   . Diabetes Father   .  Heart attack Father   . Heart attack Paternal Grandmother   . Diabetes Sister        one sister  . Hypertension Sister   . Hypertension Brother   . Multiple sclerosis Son      Current Outpatient Medications:  .  alendronate (FOSAMAX) 70 MG tablet, Take 1 tablet (70 mg total) by mouth every 7 (seven) days. Take with a full glass of water on an empty stomach, first thing in the morning and remain up right for 30 minutes after taking., Disp: 12 tablet, Rfl: 3 .  ALPRAZolam (XANAX) 1 MG tablet, TAKE 1 TABLET THREE TIMES DAILY AS NEEDED FOR ANXIETY., Disp: 90 tablet, Rfl: 5 .  aspirin 81 MG tablet, Take 81 mg by mouth at bedtime., Disp: , Rfl:  .  ciprofloxacin (CIPRO) 250 MG tablet, Take 1 tablet (250 mg total) by mouth 2 (two) times daily., Disp: 30 tablet, Rfl: 3 .  diclofenac sodium (VOLTAREN) 1 % GEL, Apply 2 g topically 4 (four) times daily., Disp: 350 g, Rfl: 4 .  donepezil (ARICEPT) 10 MG tablet, Take 1 tablet (10 mg total) by mouth at bedtime., Disp: 30 tablet, Rfl: 6 .  DULoxetine (CYMBALTA) 30 MG capsule, TAKE 1 CAPSULE ONCE DAILY ALONG WITH 60MG  CAPSULE FOR TOTAL 90MG  DAILY. (Patient taking differently: Take 30 mg by mouth daily.  Take with 60mg  to total 90mg  daily.), Disp: 90 capsule, Rfl: 1 .  DULoxetine (CYMBALTA) 60 MG capsule, TAKE 1 CAPSULE A DAY TO BE COMBINED WITH 30MG  CAPSULE (Patient taking differently: Take 60 mg by mouth daily. Take with 30mg  to total 90mg  daily.), Disp: 90 capsule, Rfl: 3 .  fluconazole (DIFLUCAN) 100 MG tablet, 1 tab po qd prn thrush (Patient taking differently: Take 100 mg by mouth daily as needed (Thrush). ), Disp: 60 tablet, Rfl: 1 .  fluticasone (FLONASE) 50 MCG/ACT nasal spray, Place 2 sprays into both nostrils daily., Disp: 16 g, Rfl: 11 .  furosemide (LASIX) 40 MG tablet, Take 1 tablet (40 mg total) by mouth daily as needed for fluid., Disp: , Rfl:  .  ipratropium (ATROVENT) 0.03 % nasal spray, Place 2 sprays into both nostrils every 12 (twelve) hours., Disp: 30 mL, Rfl: 11 .  isosorbide mononitrate (IMDUR) 30 MG 24 hr tablet, TAKE 2 TABLETS IN THE AM AND 1 TABLET IN THE PM., Disp: 90 tablet, Rfl: 0 .  lamoTRIgine (LAMICTAL) 150 MG tablet, TAKE 1 TABLET EACH DAY., Disp: 90 tablet, Rfl: 1 .  lubiprostone (AMITIZA) 24 MCG capsule, Take 1 capsule (24 mcg total) by mouth 2 (two) times daily with a meal., Disp: 60 capsule, Rfl: 1 .  metoprolol succinate (TOPROL-XL) 50 MG 24 hr tablet, TAKE (1) TABLET DAILY IN THE MORNING. (Patient taking differently: Take 50 mg by mouth daily. ), Disp: 90 tablet, Rfl: 3 .  Multiple Vitamin (MULTIVITAMIN WITH MINERALS) TABS tablet, Take 1 tablet by mouth daily., Disp: , Rfl:  .  ondansetron (ZOFRAN) 8 MG tablet, Take 1 tablet (8 mg total) by mouth every 8 (eight) hours as needed for nausea or vomiting., Disp: 30 tablet, Rfl: 6 .  oxyCODONE-acetaminophen (PERCOCET) 10-325 MG tablet, TAKE (1) TABLET EVERY SIX HOURS AS NEEDED FOR PAIN., Disp: 100 tablet, Rfl: 0 .  pantoprazole (PROTONIX) 40 MG tablet, TAKE 1 TABLET BY MOUTH DAILY. (Patient taking differently: Take 40 mg by mouth daily. ), Disp: 90 tablet, Rfl: 0 .  rosuvastatin (CRESTOR) 20 MG tablet, Take 1 tablet  (20 mg total) by mouth  daily., Disp: 90 tablet, Rfl: 3 .  solifenacin (VESICARE) 5 MG tablet, Take 1 tablet (5 mg total) by mouth daily., Disp: 30 tablet, Rfl: 1 .  traZODone (DESYREL) 50 MG tablet, TAKE 2-4 TABLETS AT BEDTIME (Patient taking differently: Take 150 mg by mouth at bedtime as needed for sleep. ), Disp: 120 tablet, Rfl: 5 .  Vitamin D, Ergocalciferol, (DRISDOL) 1.25 MG (50000 UT) CAPS capsule, TAKE (1) CAPSULE TWICE A WEEK. (Patient taking differently: Take 50,000 Units by mouth 2 (two) times a week. ), Disp: 24 capsule, Rfl: 0 .  metroNIDAZOLE (METROGEL) 0.75 % gel, Apply 1 application topically 2 (two) times daily., Disp: 45 g, Rfl: 3 .  nitroGLYCERIN (NITROSTAT) 0.4 MG SL tablet, Place 1 tablet (0.4 mg total) under the tongue every 5 (five) minutes as needed for chest pain (x 3 doses). (Patient not taking: Reported on 11/29/2019), Disp: 25 tablet, Rfl: 11 No current facility-administered medications for this visit.   Facility-Administered Medications Ordered in Other Visits:  .  ferumoxytol (FERAHEME) 510 mg in sodium chloride 0.9 % 100 mL IVPB, 510 mg, Intravenous, Once, Ennever, Rudell Cobb, MD  EXAM:  VITALS per patient if applicable: BP (!) [id number] (BP Location: Left Arm, Patient Position: Sitting, Cuff Size: Normal)   Pulse 80   SpO2 92%    GENERAL: alert, oriented, appears well and in no acute distress  HEENT: atraumatic, conjunttiva clear, no obvious abnormalities on inspection of external nose and ears  NECK: normal movements of the head and neck  LUNGS: on inspection no signs of respiratory distress, breathing rate appears normal, no obvious gross SOB, gasping or wheezing  CV: no obvious cyanosis  MS: moves all visible extremities without noticeable abnormality  PSYCH/NEURO: pleasant and cooperative, no obvious depression or anxiety, speech and thought processing grossly intact  LABS: none today  Lab Results  Component Value Date   TSH 2.750 10/04/2019   Lab  Results  Component Value Date   WBC 6.0 10/07/2019   HGB 14.8 10/07/2019   HCT 45.5 10/07/2019   MCV 90.1 10/07/2019   PLT 216 10/07/2019   Lab Results  Component Value Date   CREATININE 0.73 10/22/2019   BUN 13 10/22/2019   NA 138 10/22/2019   K 4.0 10/22/2019   CL 102 10/22/2019   CO2 27 10/22/2019   Lab Results  Component Value Date   ALT 13 10/04/2019   AST 22 10/04/2019   ALKPHOS 65 10/04/2019   BILITOT 0.6 10/04/2019   Lab Results  Component Value Date   CHOL 172 09/22/2018   Lab Results  Component Value Date   HDL 43.70 09/22/2018   Lab Results  Component Value Date   LDLCALC 92 09/22/2018   Lab Results  Component Value Date   TRIG 184.0 (H) 09/22/2018   Lab Results  Component Value Date   CHOLHDL 4 09/22/2018   Lab Results  Component Value Date   HGBA1C 5.6 01/02/2016   ASSESSMENT AND PLAN:  Discussed the following assessment and plan:  1) Uncontrolled HTN/labile HTN:  Will work on this carefully since she also historically had problems with (unexplained?) episodes of low bp in remote past.  Initially she will increase toprol xl to taking an extra 1/2 of 50 mg tab at night while continuing a whole 50mg  tab qAM.  Increase to 1 tab bid in 2-3 days if bp not consistently 130/80 or better. Renal function and K have been good->next add on med should be ARB or ACE-I.  2) Vague on/off spells of problems writing fluidly/neatly, some sense of walking unsteadily, occ falls. Unclear if these ar coinciding with periods of very high bp. Low suspicion that these are re-emerging parkinson's-like sx's that she got from taking abilify. Obs approach at this time.  3) Rosacea: no improvement with 2 mo of minocycline 50mg  bid. D/c this med. Start metronidazole gel bid.  If not signif improved in 4-6 wks then may need to add minocycline back.  4) MCI: pt never got on the increased dose (pharmacy issue): she will call the pharmcy and see if the rx I did 2 mo ago is  still on file.  She will call back and request rx again if pharmacy doesn't have it.  5) Mood disorder/treatment resistant depression-in remission. GAD stable. No changes today.  6) DOE, mild worsening of her chronic DOE that is from RAD/asbestosis, and hypertensive heart disease. (LABS at NEXT in-office visit FLP, BMET).  -we discussed possible serious and likely etiologies, options for evaluation and workup, limitations of telemedicine visit vs in person visit, treatment, treatment risks and precautions. Pt prefers to treat via telemedicine empirically rather then risking or undertaking an in person visit at this moment. Patient agrees to seek prompt in person care if worsening, new symptoms arise, or if is not improving with treatment.   I discussed the assessment and treatment plan with the patient. The patient was provided an opportunity to ask questions and all were answered. The patient agreed with the plan and demonstrated an understanding of the instructions.   The patient was advised to call back or seek an in-person evaluation if the symptoms worsen or if the condition fails to improve as anticipated.  F/u: 1 wk virtual, f/u HTN and coordination issues.  Signed:  Crissie Sickles, MD           11/29/2019

## 2019-12-01 ENCOUNTER — Encounter: Payer: Medicare Other | Attending: Physical Medicine & Rehabilitation | Admitting: Physical Medicine & Rehabilitation

## 2019-12-01 ENCOUNTER — Encounter: Payer: Self-pay | Admitting: Physical Medicine & Rehabilitation

## 2019-12-01 ENCOUNTER — Other Ambulatory Visit: Payer: Self-pay

## 2019-12-01 VITALS — BP 115/73 | HR 69 | Temp 97.7°F | Ht 64.0 in | Wt 152.0 lb

## 2019-12-01 DIAGNOSIS — M1711 Unilateral primary osteoarthritis, right knee: Secondary | ICD-10-CM | POA: Insufficient documentation

## 2019-12-01 DIAGNOSIS — M5416 Radiculopathy, lumbar region: Secondary | ICD-10-CM

## 2019-12-01 DIAGNOSIS — M1712 Unilateral primary osteoarthritis, left knee: Secondary | ICD-10-CM | POA: Diagnosis not present

## 2019-12-01 DIAGNOSIS — R2689 Other abnormalities of gait and mobility: Secondary | ICD-10-CM | POA: Insufficient documentation

## 2019-12-01 DIAGNOSIS — Z79899 Other long term (current) drug therapy: Secondary | ICD-10-CM | POA: Insufficient documentation

## 2019-12-01 DIAGNOSIS — Z5181 Encounter for therapeutic drug level monitoring: Secondary | ICD-10-CM | POA: Diagnosis present

## 2019-12-01 DIAGNOSIS — M797 Fibromyalgia: Secondary | ICD-10-CM | POA: Insufficient documentation

## 2019-12-01 DIAGNOSIS — I2510 Atherosclerotic heart disease of native coronary artery without angina pectoris: Secondary | ICD-10-CM

## 2019-12-01 MED ORDER — OXYCODONE-ACETAMINOPHEN 10-325 MG PO TABS: ORAL_TABLET | ORAL | 0 refills | Status: DC

## 2019-12-01 NOTE — Patient Instructions (Addendum)
PLEASE FEEL FREE TO CALL OUR OFFICE WITH ANY PROBLEMS OR QUESTIONS [phone]  I WANT YOU ALWAYS HAVING YOUR WALKER AT YOUR SIDE. YOU SHOULD NEVER BE GETTING UP WITHOUT IT!

## 2019-12-01 NOTE — Progress Notes (Signed)
Subjective:    Patient ID: Linda [name], female    DOB: [birth year], 73 y.o.   MRN: [id number]  HPI   Linda [name] is here in follow up of her chronic pain. She states that she's had increased problems with her balance. She has fallen 4 x's since she was last here. Every time she's fallen, it has been without her walker. She feels that her knees won't hold her up.  Occupational therapy has been at the house to help her with some safety awareness and with mobility and self-care items.  She had modest results with the last Zilretta injections.  She asked when we can do them again and we just did them 6 weeks ago.  She is using Percocet for pain 3-4 times a day.  She has "what can I have in between".  She sometimes uses some heat but generally not.  She does utilize her Voltaren gel with some relief.  Mood has been fairly stable although she has some chronic anxiety over her own issues as well as her son who has multiple sclerosis.  Pain Inventory Average Pain 9 Pain Right Now 8 My pain is constant, sharp, stabbing and aching  In the last 24 hours, has pain interfered with the following? General activity 10 Relation with others 10 Enjoyment of life 10 What TIME of day is your pain at its worst? all Sleep (in general) Poor  Pain is worse with: walking, bending, standing and some activites Pain improves with: rest, heat/ice, medication and injections Relief from Meds: 4  Mobility walk with assistance use a walker how many minutes can you walk? 5 ability to climb steps?  no do you drive?  yes  Function retired I need assistance with the following:  bathing, meal prep, household duties and shopping  Neuro/Psych bladder control problems weakness numbness trouble walking depression anxiety  Prior Studies x-rays  Physicians involved in your care Primary care [facility]   Family History  Problem Relation Age of Onset  . Arthritis Mother   . Kidney disease Mother   .  Heart disease Father   . Stroke Father   . Hypertension Father   . Diabetes Father   . Heart attack Father   . Heart attack Paternal Grandmother   . Diabetes Sister        one sister  . Hypertension Sister   . Hypertension Brother   . Multiple sclerosis Son    Social History   Socioeconomic History  . Marital status: Widowed    Spouse name: Not on file  . Number of children: 2  . Years of education: Not on file  . Highest education level: Not on file  Occupational History  . 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 Financial resource strain: Not on file  . Food insecurity    Worry: Not on file    Inability: Not on file  . Transportation needs    Medical: Not on file    Non-medical: Not on file  Tobacco Use  . Smoking status: Never Smoker  . Smokeless tobacco: Never Used  . Tobacco comment: never used tobacco  Substance and Sexual Activity  . Alcohol use: No    Alcohol/week: 0.0 standard drinks  . Drug use: No  . Sexual activity: Not Currently  Lifestyle  . Physical activity    Days per week: Not on file    Minutes per session: Not on file  .  Stress: Not on file  Relationships  . Social Herbalist on phone: Not on file    Gets together: Not on file    Attends religious service: Not on file    Active member of club or organization: Not on file    Attends meetings of clubs or organizations: Not on file    Relationship status: Not on file  Other Topics Concern  . Not on file  Social History Narrative   Widowed, 2 sons.  Relocated to [place] 09/2012 to be closer to her son who has MS.   Husband d 2015--mesothelioma.   Occupation: former Pharmacist, hospital.   Education: masters degree level.   No T/A/Ds.   Lives in [place], independent living.   Past Surgical History:  Procedure Laterality Date  . APPENDECTOMY  1960  . AXILLARY SURGERY Left 1978   Multiple "lump" in armpit per pt  . CARDIAC CATHETERIZATION  01/2013    nonobstructive CAD, EF 55-60%  . CARDIOVASCULAR STRESS TEST  02/22/15   Low risk myocard perf imaging; wall motion normal, normal EF  . carotid duplex doppler  10/21/2017   R vertebral flow suggestive of possible distal obstruction.  Pt declined further w/u as of 10/29/17 but need to revisit this problem periodically.  . COCCYX REMOVAL  1972  . DEXA  06/05/2017   T-score -3.1  . DILATION AND CURETTAGE OF UTERUS  ? 1970's  . ESOPHAGOGASTRODUODENOSCOPY N/A 07/19/2014   Gastritis found + in the setting of supratherapeutic INR, +plavix, + meloxicam.  . EYE SURGERY Left 2012-2013   "injections for ~ 1 yr; don't really know what for" (07/12/2013)  . HEEL SPUR SURGERY Left 2008  . KNEE SURGERY  2005  . LEFT HEART CATHETERIZATION WITH CORONARY ANGIOGRAM N/A 01/30/2013   Procedure: LEFT HEART CATHETERIZATION WITH CORONARY ANGIOGRAM;  Surgeon: Clent Demark, MD;  Location: [facility] CATH LAB;  Service: Cardiovascular;  Laterality: N/A;  . PLANTAR FASCIA RELEASE Left 2008  . SPIROMETRY  04/25/14   In hosp for acute asthma/COPD flare: mixed obstructive and restrictive lung disease. The FEV1 is severely reduced at 45% predicted.  FEV1 signif decreased compared to prior spirometry 07/23/13.  . TENDON RELEASE  1996   Right forearm and hand  . TOTAL ABDOMINAL HYSTERECTOMY  1974  . TRANSTHORACIC ECHOCARDIOGRAM  01/2013; 04/2014;08/2015; 09/2017   2014--NORMAL.  2015--focal basal septal hypertrophy, EF 55-60%, grade I diast dysfxn, mild LAE.  08/2015 EF 55-60%, nl LV syst fxn, grade I DD, valves wnl. 10/21/17: EF 65-70%, grd I DD, o/w normal.   Past Medical History:  Diagnosis Date  . Acute upper GI bleed 06/2014   while pt taking coumadin, plavix, and meloxicam---despite being told not to take coumadin.  . Anginal pain ([place])    Nonobstructive CAD 2014; however, her cardiologist put her on a statin for this and NOT for hyperlipidemia per pt report.  Atyp CP 08/2017 at card f/u, plan for myoc perf imaging.  . Anxiety     panic attacks  . Asthma    w/ asbestososis   . BPPV (benign paroxysmal positional vertigo) 12/16/2012  . Chronic diastolic CHF (congestive heart failure) (H[facility])    dry wt as of 11/06/16 is 168 lbs.  . Chronic lower back pain   . COPD (chronic obstructive pulmonary disease) ([place])   . DDD (degenerative disc disease)    lumbar and cervical.   . Diverticular disease   . Fibromyalgia    Patient states dx was around  her late 27s but she had sx's for years prior to this.  . H/O hiatal hernia   . Hay fever   . History of pneumonia    hospitalized 12/2011, 02/2013, and 07/2013 [facility]) for this  . HTN (hypertension)    Renal artery dopplers 04/2013 neg for stenosis.  . Hypervitaminosis D 09/27/2019   over-supplemented.  Stopped vit D and plan recheck 2 mo.  . Idiopathic angio-edema-urticaria 72014   Angioedema component was very minimal  . Insomnia   . Iron deficiency anemia    Hematologist in [place], [place] did extensive w/u; no cause found; failed oral supplement;; gets fairly regular (q40m or so) IV iron infusions (Venofer -iron sucrose- 200mg  with procrit.  "for 14 yr I've been getting blood work q month & getting infusions prn" (07/12/2013).  Dr. Marin Olp locally, iron infusions done, EPO deficiency dx'd  . Migraine syndrome    "not as often anymore; used to be ~ q wk" (07/12/2013)  . Mixed incontinence urge and stress   . Nephrolithiasis    "passed all on my own or they are still in there" (07/12/2013)  . Neuroleptic induced Parkinsonism ([place]) 2018   Dr. Carles Collet, neuro, saw her 11/24/17 and recommended d/c of abilify as first step.  D/c'd abilify and pt got complete recovery.  . OSA on CPAP    prior to move to [place]--had another sleep study 10/2015 w/pulm Dr. Camillo Flaming.  . Osteoarthritis    "severe; progressing fast" (07/12/2013); multiple joints-not surgical candidate for TKR (03/2015).  Triamcinolon knee injections by Dr. Tessa Lerner 12/2017.  [name] Kitchen Pernicious anemia 08/24/2014  . Pleural plaque with presence of  asbestos 07/22/2013  . Pulmonary embolism ([place]) 07/2013   Dx at [facility] with very small peripheral upper lobe pe 07/2013: pt took coumadin for about 8-9 mo  . Pyelonephritis    "several times over the last 30 yr" (07/12/2013)  . RBBB (right bundle branch block)   . Recurrent major depression ([place])   . Recurrent UTI    hx of hospitalization for pyelonephritis; started abx prophylaxis 06/2015  . Syncope    Hypotensive; ED visit--Dr. Terrence Dupont did Cath--nonobstructive CAD, EF 55-60%.  In retrospect, suspect pt rx med misuse/polypharmacy  . Tension headache, chronic    BP 115/73   Pulse 69   Temp 97.7 F (36.5 C)   Ht 5\' 4"  (1.626 m)   Wt 152 lb (68.9 kg)   SpO2 92%   BMI 26.09 kg/m   Opioid Risk Score:   Fall Risk Score:  `1  Depression screen PHQ 2/9  Depression screen [facility] 2/9 09/27/2019 02/24/2019 12/25/2018 09/02/2018 07/01/2018 05/27/2018 05/19/2018  Decreased Interest 1 1 1 1 1 1  0  Down, Depressed, Hopeless 2 1 1 1 1 1  0  PHQ - 2 Score 3 2 2 2 2 2  0  Altered sleeping 1 - - - - 0 -  Tired, decreased energy 1 - - - - 1 -  Change in appetite 0 - - - - 0 -  Feeling bad or failure about yourself  0 - - - - 0 -  Trouble concentrating 1 - - - - 0 -  Moving slowly or fidgety/restless 0 - - - - 0 -  Suicidal thoughts 0 - - - - 0 -  PHQ-9 Score 6 - - - - 3 -  Difficult doing work/chores Not difficult at all - - - - Not difficult at all -  Some recent data might be hidden   Review of  Systems  Respiratory: Positive for shortness of breath.   Gastrointestinal: Positive for constipation and nausea.  Genitourinary: Negative for hematuria.  Musculoskeletal: Positive for gait problem.  Neurological: Positive for weakness.  Psychiatric/Behavioral: Positive for dysphoric mood. The patient is nervous/anxious.   All other systems reviewed and are negative.      Objective:   Physical Exam Gen: no distress, normal appearing HEENT: oral mucosa pink and moist, NCAT Cardio: Reg rate Chest: normal  effort, normal rate of breathing Abd: soft, non-distended Ext: no edema Skin: intact Neuro:UE motor 5/5. LE 5/5 with pain inhibition at knees Musculoskeletal: crepitus in bilateral knees, no effusions, tender to palpation, had TP's elsewhere as well. Head forward posture Psych: pleasant, affect flat.        Assessment & Plan:  1.  Chronic bilateral osteoarthritis of the knees. 2.  Chronic low back pain and lumbar radiculitis 3.  Fibromyalgia 4.  Chronic depression 5.  History of asbestos with associated asthma 6.  Chronic bilateral greater trochanter bursitis 7.  Cervicalgia/cervical radiculitis 8.  Chronic bilateral shoulder pain   Plan: 1.  Refilled oxycodone 10/325 1 every 6 hours as needed #100 We will continue the controlled substance monitoring program, this consists of regular clinic visits, examinations, routine drug screening, pill counts as well as use of [place] Controlled Substance Reporting System. [place].   Medication was refilled and a second prescription was sent to the patient's pharmacy for next month.  2. Discussed fall prevention at length. Needs to ALWAYS have walker within arms reach. Can use heat prior to activity and ice after activities. voltaren gel also has been prescribed.    Fifteen minutes of face to face patient care time were spent during this visit. All questions were encouraged and answered.  Follow up with NP in 2 mos .

## 2019-12-06 ENCOUNTER — Emergency Department (HOSPITAL_COMMUNITY): Payer: Medicare Other

## 2019-12-06 ENCOUNTER — Ambulatory Visit (INDEPENDENT_AMBULATORY_CARE_PROVIDER_SITE_OTHER): Payer: Medicare Other | Admitting: Family Medicine

## 2019-12-06 ENCOUNTER — Encounter: Payer: Self-pay | Admitting: Family Medicine

## 2019-12-06 ENCOUNTER — Other Ambulatory Visit: Payer: Self-pay

## 2019-12-06 ENCOUNTER — Encounter (HOSPITAL_COMMUNITY): Payer: Self-pay

## 2019-12-06 ENCOUNTER — Emergency Department (HOSPITAL_COMMUNITY)
Admission: EM | Admit: 2019-12-06 | Discharge: 2019-12-06 | Disposition: A | Discharge status: Home / Self Care | Payer: Medicare Other | Attending: Emergency Medicine | Admitting: Emergency Medicine

## 2019-12-06 VITALS — BP 115/73 | HR 69 | Ht 64.0 in | Wt 152.0 lb

## 2019-12-06 DIAGNOSIS — Z79899 Other long term (current) drug therapy: Secondary | ICD-10-CM | POA: Insufficient documentation

## 2019-12-06 DIAGNOSIS — R7989 Other specified abnormal findings of blood chemistry: Secondary | ICD-10-CM | POA: Diagnosis not present

## 2019-12-06 DIAGNOSIS — R4182 Altered mental status, unspecified: Secondary | ICD-10-CM

## 2019-12-06 DIAGNOSIS — Z20828 Contact with and (suspected) exposure to other viral communicable diseases: Secondary | ICD-10-CM | POA: Diagnosis not present

## 2019-12-06 DIAGNOSIS — I2510 Atherosclerotic heart disease of native coronary artery without angina pectoris: Secondary | ICD-10-CM | POA: Diagnosis not present

## 2019-12-06 DIAGNOSIS — J9611 Chronic respiratory failure with hypoxia: Secondary | ICD-10-CM | POA: Diagnosis not present

## 2019-12-06 DIAGNOSIS — R410 Disorientation, unspecified: Secondary | ICD-10-CM | POA: Insufficient documentation

## 2019-12-06 DIAGNOSIS — J449 Chronic obstructive pulmonary disease, unspecified: Secondary | ICD-10-CM | POA: Insufficient documentation

## 2019-12-06 DIAGNOSIS — J4530 Mild persistent asthma, uncomplicated: Secondary | ICD-10-CM

## 2019-12-06 DIAGNOSIS — R0602 Shortness of breath: Secondary | ICD-10-CM | POA: Diagnosis not present

## 2019-12-06 DIAGNOSIS — I5032 Chronic diastolic (congestive) heart failure: Secondary | ICD-10-CM | POA: Diagnosis not present

## 2019-12-06 DIAGNOSIS — J9601 Acute respiratory failure with hypoxia: Secondary | ICD-10-CM | POA: Diagnosis not present

## 2019-12-06 DIAGNOSIS — I110 Hypertensive heart disease with heart failure: Secondary | ICD-10-CM | POA: Insufficient documentation

## 2019-12-06 DIAGNOSIS — R7309 Other abnormal glucose: Secondary | ICD-10-CM | POA: Insufficient documentation

## 2019-12-06 LAB — COMPREHENSIVE METABOLIC PANEL
ALT: 15 U/L (ref 0–44)
AST: 21 U/L (ref 15–41)
Albumin: 3.5 g/dL (ref 3.5–5.0)
Alkaline Phosphatase: 78 U/L (ref 38–126)
Anion gap: 11 (ref 5–15)
BUN: 11 mg/dL (ref 8–23)
CO2: 27 mmol/L (ref 22–32)
Calcium: 8.7 mg/dL — ABNORMAL LOW (ref 8.9–10.3)
Chloride: 103 mmol/L (ref 98–111)
Creatinine, Ser: 0.74 mg/dL (ref 0.44–1.00)
GFR calc Af Amer: >60 mL/min (ref >60)
GFR calc non Af Amer: >60 mL/min (ref >60)
Glucose, Bld: 91 mg/dL (ref 70–99)
Potassium: 3.6 mmol/L (ref 3.5–5.1)
Sodium: 141 mmol/L (ref 135–145)
Total Bilirubin: 0.3 mg/dL (ref 0.3–1.2)
Total Protein: 6.5 g/dL (ref 6.5–8.1)

## 2019-12-06 LAB — CBC WITH DIFFERENTIAL/PLATELET
Abs Immature Granulocytes: 0.01 10*3/uL (ref 0.00–0.07)
Basophils Absolute: 0 10*3/uL (ref 0.0–0.1)
Basophils Relative: 1 %
Eosinophils Absolute: 0.1 10*3/uL (ref 0.0–0.5)
Eosinophils Relative: 2 %
HCT: 37.7 % (ref 36.0–46.0)
Hemoglobin: 11.8 g/dL — ABNORMAL LOW (ref 12.0–15.0)
Immature Granulocytes: 0 %
Lymphocytes Relative: 37 %
Lymphs Abs: 2 10*3/uL (ref 0.7–4.0)
MCH: 29.4 pg (ref 26.0–34.0)
MCHC: 31.3 g/dL (ref 30.0–36.0)
MCV: 93.8 fL (ref 80.0–100.0)
Monocytes Absolute: 0.5 10*3/uL (ref 0.1–1.0)
Monocytes Relative: 9 %
Neutro Abs: 2.8 10*3/uL (ref 1.7–7.7)
Neutrophils Relative %: 51 %
Platelets: 195 10*3/uL (ref 150–400)
RBC: 4.02 MIL/uL (ref 3.87–5.11)
RDW: 12 % (ref 11.5–15.5)
WBC: 5.4 10*3/uL (ref 4.0–10.5)
nRBC: 0 % (ref 0.0–0.2)

## 2019-12-06 LAB — TROPONIN I (HIGH SENSITIVITY): Troponin I (High Sensitivity): <3 ng/L (ref <18)

## 2019-12-06 LAB — BRAIN NATRIURETIC PEPTIDE: B Natriuretic Peptide: 26.8 pg/mL (ref 0.0–100.0)

## 2019-12-06 MED ORDER — ALBUTEROL SULFATE HFA 108 (90 BASE) MCG/ACT IN AERS: 1.0000 | INHALATION_SPRAY | Freq: Four times a day (QID) | RESPIRATORY_TRACT | 1 refills | Status: DC | PRN

## 2019-12-06 NOTE — ED Provider Notes (Signed)
[facility] DEPT Provider Note   CSN: [id number] Arrival date & time: 12/06/19  1550     History Chief Complaint  Patient presents with  . Altered Mental Status  . Shortness of Breath    Linda [name] is a 74 y.o. female.  73 year old female with prior medical history as detailed below presents for evaluation of transient confusion.  Patient reported a had a virtual visit with her primary care provider earlier today.  EMS was contacted given patient's apparent confusion and mild hypoxia.  Upon arrival to the ED patient is without specific complaint.  She denies shortness of breath.  She denies chest pain.  She denies fever.  She is alert and oriented.  She is otherwise without specific complaint.  The history is provided by medical records, the patient and a relative.  Altered Mental Status Presenting symptoms: confusion   Severity:  Mild Most recent episode:  Today Episode history:  Single Timing:  Rare Progression:  Resolved Chronicity:  New      Past Medical History:  Diagnosis Date  . Acute upper GI bleed 06/2014   while pt taking coumadin, plavix, and meloxicam---despite being told not to take coumadin.  . Anginal pain ([place])    Nonobstructive CAD 2014; however, her cardiologist put her on a statin for this and NOT for hyperlipidemia per pt report.  Atyp CP 08/2017 at card f/u, plan for myoc perf imaging.  . Anxiety    panic attacks  . Asthma    w/ asbestososis   . BPPV (benign paroxysmal positional vertigo) 12/16/2012  . Chronic diastolic CHF (congestive heart failure) (HCC)    dry wt as of 11/06/16 is 168 lbs.  . Chronic lower back pain   . COPD (chronic obstructive pulmonary disease) ([place])   . DDD (degenerative disc disease)    lumbar and cervical.   . Diverticular disease   . Fibromyalgia    Patient states dx was around her late 45s but she had sx's for years prior to this.  . H/O hiatal hernia   . Hay fever   . History  of pneumonia    hospitalized 12/2011, 02/2013, and 07/2013 [facility]) for this  . HTN (hypertension)    Renal artery dopplers 04/2013 neg for stenosis.  . Hypervitaminosis D 09/27/2019   over-supplemented.  Stopped vit D and plan recheck 2 mo.  . Idiopathic angio-edema-urticaria 72014   Angioedema component was very minimal  . Insomnia   . Iron deficiency anemia    Hematologist in [place], [place] did extensive w/u; no cause found; failed oral supplement;; gets fairly regular (q40m or so) IV iron infusions (Venofer -iron sucrose- 200mg  with procrit.  "for 14 yr I've been getting blood work q month & getting infusions prn" (07/12/2013).  Dr. Marin Olp locally, iron infusions done, EPO deficiency dx'd  . Migraine syndrome    "not as often anymore; used to be ~ q wk" (07/12/2013)  . Mixed incontinence urge and stress   . Nephrolithiasis    "passed all on my own or they are still in there" (07/12/2013)  . Neuroleptic induced Parkinsonism ([place]) 2018   Dr. Carles Collet, neuro, saw her 11/24/17 and recommended d/c of abilify as first step.  D/c'd abilify and pt got complete recovery.  . OSA on CPAP    prior to move to [place]--had another sleep study 10/2015 w/pulm Dr. Camillo Flaming.  . Osteoarthritis    "severe; progressing fast" (07/12/2013); multiple joints-not surgical candidate for TKR (03/2015).  Triamcinolon  knee injections by Dr. Tessa Lerner 12/2017.  [name] Kitchen Pernicious anemia 08/24/2014  . Pleural plaque with presence of asbestos 07/22/2013  . Pulmonary embolism ([place]) 07/2013   Dx at [facility] with very small peripheral upper lobe pe 07/2013: pt took coumadin for about 8-9 mo  . Pyelonephritis    "several times over the last 30 yr" (07/12/2013)  . RBBB (right bundle branch block)   . Recurrent major depression ([place])   . Recurrent UTI    hx of hospitalization for pyelonephritis; started abx prophylaxis 06/2015  . Syncope    Hypotensive; ED visit--Dr. Terrence Dupont did Cath--nonobstructive CAD, EF 55-60%.  In retrospect, suspect pt rx med  misuse/polypharmacy  . Tension headache, chronic     Patient Active Problem List   Diagnosis Date Noted  . Hypotension 10/04/2019  . COPD (chronic obstructive pulmonary disease) ([place]) 01/21/2018  . Pulmonary asbestosis ([place]) 01/21/2018  . Influenza A 01/02/2018  . Hypomagnesemia 01/02/2018  . Failure to thrive in adult 01/01/2018  . Altered mental status 10/21/2017  . Lumbar radiculitis 02/10/2017  . Dehydration 12/04/2016  . Low serum erythropoietin level 10/17/2016  . RBBB 09/23/2016  . Primary osteoarthritis of left knee 06/19/2016  . Debilitated patient 06/06/2016  . CHF (congestive heart failure) ([place]) 05/30/2016  . SOB (shortness of breath)   . Chronic respiratory failure with hypoxia ([place]) 02/07/2016  . Restrictive lung disease 02/07/2016  . Rotator cuff syndrome of right shoulder 01/10/2016  . UTI (urinary tract infection) 01/01/2016  . Encephalopathy, metabolic 99991111  . CAP (community acquired pneumonia) 01/01/2016  . Fall at home 01/01/2016  . Rhabdomyolysis 01/01/2016  . Diastolic heart failure ([place]) 10/16/2015  . CAD-minor 2014 08/16/2015  . Chest pain with moderate risk for cardiac etiology 08/16/2015  . Narrowing of intervertebral disc space 07/17/2015  . Bilateral lower leg pain 01/24/2015  . 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 Chronic pain syndrome 11/21/2014  . Anxiety and depression 11/21/2014  . Abnormal grief reaction 11/21/2014  . Severe persistent asthma 11/21/2014  . Hypokalemia 11/21/2014  . Hyperlipidemia 11/21/2014  . Chronic pain disorder 11/21/2014  . Primary osteoarthritis of right knee 10/18/2014  . Pernicious anemia 08/24/2014  . Generalized anxiety disorder--with occasional panic attacks.  08/05/2014  . Recurrent major depression-severe ([place]) 08/05/2014  . Multifactorial gait disorder 07/26/2014  . Epistaxis 07/18/2014  . Acute GI bleeding 07/17/2014  . Anemia associated with acute blood loss 07/17/2014  . Syncope 07/17/2014   . History of pulmonary embolism 07/17/2014  . GI bleed 07/17/2014  . Arthritis 05/11/2014  . DDD (degenerative disc disease) 05/11/2014  . Fibrositis 05/11/2014  . Amianthosis ([place]) 05/11/2014  . [name] ([place]) 05/11/2014  . Grief 04/28/2014  . OSA (obstructive sleep apnea) 04/24/2014  . Chronic diastolic CHF, NYHA class 1 04/24/2014  . Acute respiratory failure ([place]) 08/20/2013  . Atelectasis 08/06/2013  . Acute pulmonary embolism ([place]) 08/04/2013  . Pulmonary embolism ([place]) 08/04/2013  . Pleural plaque with presence of asbestos 07/22/2013  . Polypharmacy 04/26/2013  . Fibromyalgia syndrome 03/01/2013  . Insomnia 11/12/2012  . HTN (hypertension), benign 10/25/2012    Past Surgical History:  Procedure Laterality Date  . APPENDECTOMY  1960  . AXILLARY SURGERY Left 1978   Multiple "lump" in armpit per pt  . CARDIAC CATHETERIZATION  01/2013   nonobstructive CAD, EF 55-60%  . CARDIOVASCULAR STRESS TEST  02/22/15   Low risk myocard perf imaging; wall motion normal, normal EF  . carotid duplex doppler  10/21/2017   R vertebral  flow suggestive of possible distal obstruction.  Pt declined further w/u as of 10/29/17 but need to revisit this problem periodically.  . COCCYX REMOVAL  1972  . DEXA  06/05/2017   T-score -3.1  . DILATION AND CURETTAGE OF UTERUS  ? 1970's  . ESOPHAGOGASTRODUODENOSCOPY N/A 07/19/2014   Gastritis found + in the setting of supratherapeutic INR, +plavix, + meloxicam.  . EYE SURGERY Left 2012-2013   "injections for ~ 1 yr; don't really know what for" (07/12/2013)  . HEEL SPUR SURGERY Left 2008  . KNEE SURGERY  2005  . LEFT HEART CATHETERIZATION WITH CORONARY ANGIOGRAM N/A 01/30/2013   Procedure: LEFT HEART CATHETERIZATION WITH CORONARY ANGIOGRAM;  Surgeon: Clent Demark, MD;  Location: [facility] CATH LAB;  Service: Cardiovascular;  Laterality: N/A;  . PLANTAR FASCIA RELEASE Left 2008  . SPIROMETRY  04/25/14   In hosp for acute asthma/COPD flare: mixed obstructive and  restrictive lung disease. The FEV1 is severely reduced at 45% predicted.  FEV1 signif decreased compared to prior spirometry 07/23/13.  . TENDON RELEASE  1996   Right forearm and hand  . TOTAL ABDOMINAL HYSTERECTOMY  1974  . TRANSTHORACIC ECHOCARDIOGRAM  01/2013; 04/2014;08/2015; 09/2017   2014--NORMAL.  2015--focal basal septal hypertrophy, EF 55-60%, grade I diast dysfxn, mild LAE.  08/2015 EF 55-60%, nl LV syst fxn, grade I DD, valves wnl. 10/21/17: EF 65-70%, grd I DD, o/w normal.     OB History   No obstetric history on file.     Family History  Problem Relation Age of Onset  . Arthritis Mother   . Kidney disease Mother   . Heart disease Father   . Stroke Father   . Hypertension Father   . Diabetes Father   . Heart attack Father   . Heart attack Paternal Grandmother   . Diabetes Sister        one sister  . Hypertension Sister   . Hypertension Brother   . Multiple sclerosis Son     Social History   Tobacco Use  . Smoking status: Never Smoker  . Smokeless tobacco: Never Used  . Tobacco comment: never used tobacco  Substance Use Topics  . Alcohol use: No    Alcohol/week: 0.0 standard drinks  . Drug use: No    Home Medications Prior to Admission medications   Medication Sig Start Date End Date Taking? Authorizing Provider  albuterol (VENTOLIN HFA) 108 (90 Base) MCG/ACT inhaler Inhale 1-2 puffs into the lungs every 6 (six) hours as needed for wheezing or shortness of breath. 12/06/19   McGowen, Adrian Blackwater, MD  alendronate (FOSAMAX) 70 MG tablet Take 1 tablet (70 mg total) by mouth every 7 (seven) days. Take with a full glass of water on an empty stomach, first thing in the morning and remain up right for 30 minutes after taking. 07/26/19   McGowen, Adrian Blackwater, MD  ALPRAZolam (XANAX) 1 MG tablet TAKE 1 TABLET THREE TIMES DAILY AS NEEDED FOR ANXIETY. 10/11/19   McGowen, Adrian Blackwater, MD  aspirin 81 MG tablet Take 81 mg by mouth at bedtime.    [provider]  ciprofloxacin  (CIPRO) 250 MG tablet Take 1 tablet (250 mg total) by mouth 2 (two) times daily. 10/22/19   McGowen, Adrian Blackwater, MD  diclofenac sodium (VOLTAREN) 1 % GEL Apply 2 g topically 4 (four) times daily. 10/20/19   Meredith Staggers, MD  donepezil (ARICEPT) 10 MG tablet Take 1 tablet (10 mg total) by mouth at bedtime. 09/28/19  McGowen, Adrian Blackwater, MD  DULoxetine (CYMBALTA) 30 MG capsule TAKE 1 CAPSULE ONCE DAILY ALONG WITH 60MG  CAPSULE FOR TOTAL 90MG  DAILY. Patient taking differently: Take 30 mg by mouth daily. Take with 60mg  to total 90mg  daily. 07/15/19   McGowen, Adrian Blackwater, MD  DULoxetine (CYMBALTA) 60 MG capsule TAKE 1 CAPSULE A DAY TO BE COMBINED WITH 30MG  CAPSULE Patient taking differently: Take 60 mg by mouth daily. Take with 30mg  to total 90mg  daily. 07/27/19   McGowen, Adrian Blackwater, MD  fluconazole (DIFLUCAN) 100 MG tablet 1 tab po qd prn thrush Patient taking differently: Take 100 mg by mouth daily as needed (Thrush).  07/26/19   McGowen, Adrian Blackwater, MD  fluticasone (FLONASE) 50 MCG/ACT nasal spray Place 2 sprays into both nostrils daily. 09/27/19   McGowen, Adrian Blackwater, MD  furosemide (LASIX) 40 MG tablet Take 1 tablet (40 mg total) by mouth daily as needed for fluid. 10/07/19   Cherylann Ratel A, DO  ipratropium (ATROVENT) 0.03 % nasal spray Place 2 sprays into both nostrils every 12 (twelve) hours. 09/27/19   McGowen, Adrian Blackwater, MD  isosorbide mononitrate (IMDUR) 30 MG 24 hr tablet TAKE 2 TABLETS IN THE AM AND 1 TABLET IN THE PM. 11/24/19   Lelon Perla, MD  lamoTRIgine (LAMICTAL) 150 MG tablet TAKE 1 TABLET EACH DAY. 11/22/19   McGowen, Adrian Blackwater, MD  lubiprostone (AMITIZA) 24 MCG capsule Take 1 capsule (24 mcg total) by mouth 2 (two) times daily with a meal. 10/22/19   McGowen, Adrian Blackwater, MD  metoprolol succinate (TOPROL-XL) 50 MG 24 hr tablet TAKE (1) TABLET DAILY IN THE MORNING. Patient taking differently: Take 50 mg by mouth daily.  07/27/19   McGowen, Adrian Blackwater, MD  metroNIDAZOLE (METROGEL) 0.75 % gel Apply 1  application topically 2 (two) times daily. 11/29/19   McGowen, Adrian Blackwater, MD  Multiple Vitamin (MULTIVITAMIN WITH MINERALS) TABS tablet Take 1 tablet by mouth daily.    [provider]  nitroGLYCERIN (NITROSTAT) 0.4 MG SL tablet Place 1 tablet (0.4 mg total) under the tongue every 5 (five) minutes as needed for chest pain (x 3 doses). Patient not taking: Reported on 11/29/2019 09/23/19   Lelon Perla, MD  ondansetron (ZOFRAN) 8 MG tablet Take 1 tablet (8 mg total) by mouth every 8 (eight) hours as needed for nausea or vomiting. 10/22/19   McGowen, Adrian Blackwater, MD  oxyCODONE-acetaminophen (PERCOCET) 10-325 MG tablet TAKE (1) TABLET EVERY SIX HOURS AS NEEDED FOR PAIN. 12/01/19   Meredith Staggers, MD  pantoprazole (PROTONIX) 40 MG tablet TAKE 1 TABLET BY MOUTH DAILY. Patient taking differently: Take 40 mg by mouth daily.  09/01/19   McGowen, Adrian Blackwater, MD  rosuvastatin (CRESTOR) 20 MG tablet Take 1 tablet (20 mg total) by mouth daily. 09/23/19 12/22/19  Lelon Perla, MD  solifenacin (VESICARE) 5 MG tablet Take 1 tablet (5 mg total) by mouth daily. 10/22/19   McGowen, Adrian Blackwater, MD  traZODone (DESYREL) 50 MG tablet TAKE 2-4 TABLETS AT BEDTIME Patient taking differently: Take 150 mg by mouth at bedtime as needed for sleep.  04/29/19   McGowen, Adrian Blackwater, MD  Vitamin D, Ergocalciferol, (DRISDOL) 1.25 MG (50000 UT) CAPS capsule TAKE (1) CAPSULE TWICE A WEEK. Patient taking differently: Take 50,000 Units by mouth 2 (two) times a week.  07/27/19   McGowen, Adrian Blackwater, MD    Allergies    Abilify [aripiprazole] and Penicillins  Review of Systems   Review of Systems  Psychiatric/Behavioral: Positive  for confusion.  All other systems reviewed and are negative.   Physical Exam Updated Vital Signs BP (!) 148/74   Pulse 63   Temp 98.3 F (36.8 C)   Resp 14   SpO2 99%   Physical Exam Vitals and nursing note reviewed.  Constitutional:      General: She is not in acute distress.    Appearance: She  is well-developed.  HENT:     Head: Normocephalic and atraumatic.  Eyes:     Conjunctiva/sclera: Conjunctivae normal.     Pupils: Pupils are equal, round, and reactive to light.  Cardiovascular:     Rate and Rhythm: Normal rate and regular rhythm.     Heart sounds: Normal heart sounds.  Pulmonary:     Effort: Pulmonary effort is normal. No respiratory distress.     Breath sounds: Normal breath sounds.  Abdominal:     General: There is no distension.     Palpations: Abdomen is soft.     Tenderness: There is no abdominal tenderness.  Musculoskeletal:        General: No deformity. Normal range of motion.     Cervical back: Normal range of motion and neck supple.  Skin:    General: Skin is warm and dry.  Neurological:     Mental Status: She is alert and oriented to person, place, and time.     ED Results / Procedures / Treatments   Labs (all labs ordered are listed, but only abnormal results are displayed) Labs Reviewed  CBC WITH DIFFERENTIAL/PLATELET - Abnormal; Notable for the following components:      Result Value   Hemoglobin 11.8 (*)    All other components within normal limits  COMPREHENSIVE METABOLIC PANEL - Abnormal; Notable for the following components:   Calcium 8.7 (*)    All other components within normal limits  CULTURE, BLOOD (ROUTINE X 2)  CULTURE, BLOOD (ROUTINE X 2)  SARS CORONAVIRUS 2 (TAT 6-24 HRS)  BRAIN NATRIURETIC PEPTIDE  URINALYSIS, ROUTINE W REFLEX MICROSCOPIC  TROPONIN I (HIGH SENSITIVITY)  TROPONIN I (HIGH SENSITIVITY)    EKG EKG Interpretation  Date/Time:  Monday December 06 2019 16:54:03 EST Ventricular Rate:  63 PR Interval:    QRS Duration: 161 QT Interval:  462 QTC Calculation: 473 R Axis:   3 Text Interpretation: Sinus rhythm Probable left atrial enlargement Right bundle branch block Confirmed by Dene Gentry [phone]) on 12/06/2019 4:56:44 PM   Radiology CT Head Wo Contrast  Result Date: 12/06/2019 CLINICAL DATA:  Altered  mental status EXAM: CT HEAD WITHOUT CONTRAST TECHNIQUE: Contiguous axial images were obtained from the base of the skull through the vertex without intravenous contrast. COMPARISON:  2018 FINDINGS: Brain: There is no acute intracranial hemorrhage, mass-effect, or edema. Gray-white differentiation is preserved. There is no extra-axial fluid collection. Ventricles and sulci are within normal limits in size and configuration. Patchy hypoattenuation in the supratentorial white matter is nonspecific but may reflect mild chronic microvascular ischemic changes. Vascular: There is atherosclerotic calcification at the skull base. Skull: Calvarium is unremarkable. Sinuses/Orbits: Aerated.  Bilateral lens replacements. Other: Mastoid air cells are clear. IMPRESSION: No acute intracranial abnormality. Stable chronic findings detailed above. Electronically Signed   By: Macy Mis M.D.   On: 12/06/2019 16:57   DG Chest Port 1 View  Result Date: 12/06/2019 CLINICAL DATA:  Shortness of breath. EXAM: PORTABLE CHEST 1 VIEW COMPARISON:  10/04/2019 FINDINGS: The cardiac silhouette remains borderline enlarged. Stable bilateral calcified pleural plaques. Mild increase in linear  density at the left lung base. Diffuse osteopenia. IMPRESSION: 1. Increased linear atelectasis or scarring at the left lung base. 2. Stable bilateral calcified pleural plaques compatible with previous asbestos exposure. 3. Stable mild cardiomegaly. Electronically Signed   By: Claudie Revering M.D.   On: 12/06/2019 16:57    Procedures Procedures (including critical care time)  Medications Ordered in ED Medications - No data to display  ED Course  I have reviewed the triage vital signs and the nursing notes.  Pertinent labs & imaging results that were available during my care of the patient were reviewed by me and considered in my medical decision making (see chart for details).    MDM Rules/Calculators/A&P                      MDM  Screen  complete  AMANTHA MCCOMMON was evaluated in Emergency Department on 12/06/2019 for the symptoms described in the history of present illness. She was evaluated in the context of the global COVID-19 pandemic, which necessitated consideration that the patient might be at risk for infection with the SARS-CoV-2 virus that causes COVID-19. Institutional protocols and algorithms that pertain to the evaluation of patients at risk for COVID-19 are in a state of rapid change based on information released by regulatory bodies including the CDC and federal and state organizations. These policies and algorithms were followed during the patient's care in the ED.  Patient is presenting for evaluation.  She is currently without specific complaint.  Screening work-up in the ED did not reveal significant acute pathology.  Patient now desires discharge home.  She does understand the need for close follow-up.  Her son was contacted and understands the course of the work-up today.  He reports that he will be able to take her home.  Strict return precautions given to the patient and understood by the patient.   Final Clinical Impression(s) / ED Diagnoses Final diagnoses:  Intermittent confusion    Rx / DC Orders ED Discharge Orders    None       Valarie Merino, MD 12/06/19 [phone]

## 2019-12-06 NOTE — Discharge Instructions (Addendum)
Please return for any problem.  Follow-up with your regular care provider as instructed. °

## 2019-12-06 NOTE — Progress Notes (Signed)
Virtual Visit via Video Note  I connected with pt on 12/06/19 at  2:00 PM EST by a video enabled telemedicine application and verified that I am speaking with the correct person using two identifiers.  Location patient: home Location provider:work or home office Persons participating in the virtual visit: patient, provider  I discussed the limitations of evaluation and management by telemedicine and the availability of in person appointments. The patient expressed understanding and agreed to proceed.  Telemedicine visit is a necessity given the COVID-19 restrictions in place at the current time.  HPI: 73 y/o WF being seen today for 1 week f/u uncontrolled HTN. We decided to titrate toprol xl slowly, with plan to add on ACE-I or ARB if this did not get bp controlled adequately.  For her rosacea we started metronidazole gel and stopped her minocycline.  Last week she got bp at ortho and it was <130/80.  Says she is feeling "kinda bad"--pain in hips from bruising/contusion, ? Sciatica on R,  She sees oxygen around 92% when she checks it-up until last couple months?? Pt not clear on much today.  She notes sat 87% today, then 85% on recheck 10 min later.  Having more SOB, can't finish sentences like normal.  This has been gradually progressing the last 2 weeks.  Very sleepy a lot lately, not focusing well, some intermittent dizziness Last oxygen sat before today was at ortho visit "about 92%).  She does still have oxygen at home but has to have this set up again before being able to utilize it.  Has not been to Dr. Camillo Flaming, her pulm MD, b/c after 4 rescheduled visits by him she decided she could not count on him.  No persistent wheezing.  No exertional CP.  Chest wall pain constant since relatively recent falls (rib x-rays normal). Denies exertion CP but she hardly exerts herself at all.  No left arm pain, no jaw pain, no diaphoresis.    ROS: minimal nonproductive cough, no HAs, no rashes, no  melena/hematochezia.  No polyuria or polydipsia.   +constipation.  Some nausea but no vomiting.  No abd pain.  No blurry or double vision.  No focal weakness, no slurred speech or problems swallowing.     Past Medical History:  Diagnosis Date  . Acute upper GI bleed 06/2014   while pt taking coumadin, plavix, and meloxicam---despite being told not to take coumadin.  . Anginal pain ([place])    Nonobstructive CAD 2014; however, her cardiologist put her on a statin for this and NOT for hyperlipidemia per pt report.  Atyp CP 08/2017 at card f/u, plan for myoc perf imaging.  . Anxiety    panic attacks  . Asthma    w/ asbestososis   . BPPV (benign paroxysmal positional vertigo) 12/16/2012  . Chronic diastolic CHF (congestive heart failure) (H[facility])    dry wt as of 11/06/16 is 168 lbs.  . Chronic lower back pain   . COPD (chronic obstructive pulmonary disease) ([place])   . DDD (degenerative disc disease)    lumbar and cervical.   . Diverticular disease   . Fibromyalgia    Patient states dx was around her late 22s but she had sx's for years prior to this.  . H/O hiatal hernia   . Hay fever   . History of pneumonia    hospitalized 12/2011, 02/2013, and 07/2013 [facility]) for this  . HTN (hypertension)    Renal artery dopplers 04/2013 neg for stenosis.  . Hypervitaminosis D  09/27/2019   over-supplemented.  Stopped vit D and plan recheck 2 mo.  . Idiopathic angio-edema-urticaria 72014   Angioedema component was very minimal  . Insomnia   . Iron deficiency anemia    Hematologist in [place], [place] did extensive w/u; no cause found; failed oral supplement;; gets fairly regular (q10m or so) IV iron infusions (Venofer -iron sucrose- 200mg  with procrit.  "for 14 yr I've been getting blood work q month & getting infusions prn" (07/12/2013).  Dr. Marin Olp locally, iron infusions done, EPO deficiency dx'd  . Migraine syndrome    "not as often anymore; used to be ~ q wk" (07/12/2013)  . Mixed incontinence urge and stress    . Nephrolithiasis    "passed all on my own or they are still in there" (07/12/2013)  . Neuroleptic induced Parkinsonism ([place]) 2018   Dr. Carles Collet, neuro, saw her 11/24/17 and recommended d/c of abilify as first step.  D/c'd abilify and pt got complete recovery.  . OSA on CPAP    prior to move to [place]--had another sleep study 10/2015 w/pulm Dr. Camillo Flaming.  . Osteoarthritis    "severe; progressing fast" (07/12/2013); multiple joints-not surgical candidate for TKR (03/2015).  Triamcinolon knee injections by Dr. Tessa Lerner 12/2017.  [name] Kitchen Pernicious anemia 08/24/2014  . Pleural plaque with presence of asbestos 07/22/2013  . Pulmonary embolism ([place]) 07/2013   Dx at [facility] with very small peripheral upper lobe pe 07/2013: pt took coumadin for about 8-9 mo  . Pyelonephritis    "several times over the last 30 yr" (07/12/2013)  . RBBB (right bundle branch block)   . Recurrent major depression ([place])   . Recurrent UTI    hx of hospitalization for pyelonephritis; started abx prophylaxis 06/2015  . Syncope    Hypotensive; ED visit--Dr. Terrence Dupont did Cath--nonobstructive CAD, EF 55-60%.  In retrospect, suspect pt rx med misuse/polypharmacy  . Tension headache, chronic     Past Surgical History:  Procedure Laterality Date  . APPENDECTOMY  1960  . AXILLARY SURGERY Left 1978   Multiple "lump" in armpit per pt  . CARDIAC CATHETERIZATION  01/2013   nonobstructive CAD, EF 55-60%  . CARDIOVASCULAR STRESS TEST  02/22/15   Low risk myocard perf imaging; wall motion normal, normal EF  . carotid duplex doppler  10/21/2017   R vertebral flow suggestive of possible distal obstruction.  Pt declined further w/u as of 10/29/17 but need to revisit this problem periodically.  . COCCYX REMOVAL  1972  . DEXA  06/05/2017   T-score -3.1  . DILATION AND CURETTAGE OF UTERUS  ? 1970's  . ESOPHAGOGASTRODUODENOSCOPY N/A 07/19/2014   Gastritis found + in the setting of supratherapeutic INR, +plavix, + meloxicam.  . EYE SURGERY Left 2012-2013    "injections for ~ 1 yr; don't really know what for" (07/12/2013)  . HEEL SPUR SURGERY Left 2008  . KNEE SURGERY  2005  . LEFT HEART CATHETERIZATION WITH CORONARY ANGIOGRAM N/A 01/30/2013   Procedure: LEFT HEART CATHETERIZATION WITH CORONARY ANGIOGRAM;  Surgeon: Clent Demark, MD;  Location: [facility] CATH LAB;  Service: Cardiovascular;  Laterality: N/A;  . PLANTAR FASCIA RELEASE Left 2008  . SPIROMETRY  04/25/14   In hosp for acute asthma/COPD flare: mixed obstructive and restrictive lung disease. The FEV1 is severely reduced at 45% predicted.  FEV1 signif decreased compared to prior spirometry 07/23/13.  . TENDON RELEASE  1996   Right forearm and hand  . TOTAL ABDOMINAL HYSTERECTOMY  1974  . TRANSTHORACIC ECHOCARDIOGRAM  01/2013; 04/2014;08/2015;  09/2017   2014--NORMAL.  2015--focal basal septal hypertrophy, EF 55-60%, grade I diast dysfxn, mild LAE.  08/2015 EF 55-60%, nl LV syst fxn, grade I DD, valves wnl. 10/21/17: EF 65-70%, grd I DD, o/w normal.    Family History  Problem Relation Age of Onset  . Arthritis Mother   . Kidney disease Mother   . Heart disease Father   . Stroke Father   . Hypertension Father   . Diabetes Father   . Heart attack Father   . Heart attack Paternal Grandmother   . Diabetes Sister        one sister  . Hypertension Sister   . Hypertension Brother   . Multiple sclerosis Son      Current Outpatient Medications:  .  alendronate (FOSAMAX) 70 MG tablet, Take 1 tablet (70 mg total) by mouth every 7 (seven) days. Take with a full glass of water on an empty stomach, first thing in the morning and remain up right for 30 minutes after taking., Disp: 12 tablet, Rfl: 3 .  ALPRAZolam (XANAX) 1 MG tablet, TAKE 1 TABLET THREE TIMES DAILY AS NEEDED FOR ANXIETY., Disp: 90 tablet, Rfl: 5 .  aspirin 81 MG tablet, Take 81 mg by mouth at bedtime., Disp: , Rfl:  .  ciprofloxacin (CIPRO) 250 MG tablet, Take 1 tablet (250 mg total) by mouth 2 (two) times daily., Disp: 30 tablet, Rfl: 3 .   diclofenac sodium (VOLTAREN) 1 % GEL, Apply 2 g topically 4 (four) times daily., Disp: 350 g, Rfl: 4 .  donepezil (ARICEPT) 10 MG tablet, Take 1 tablet (10 mg total) by mouth at bedtime., Disp: 30 tablet, Rfl: 6 .  DULoxetine (CYMBALTA) 30 MG capsule, TAKE 1 CAPSULE ONCE DAILY ALONG WITH 60MG  CAPSULE FOR TOTAL 90MG  DAILY. (Patient taking differently: Take 30 mg by mouth daily. Take with 60mg  to total 90mg  daily.), Disp: 90 capsule, Rfl: 1 .  DULoxetine (CYMBALTA) 60 MG capsule, TAKE 1 CAPSULE A DAY TO BE COMBINED WITH 30MG  CAPSULE (Patient taking differently: Take 60 mg by mouth daily. Take with 30mg  to total 90mg  daily.), Disp: 90 capsule, Rfl: 3 .  fluconazole (DIFLUCAN) 100 MG tablet, 1 tab po qd prn thrush (Patient taking differently: Take 100 mg by mouth daily as needed (Thrush). ), Disp: 60 tablet, Rfl: 1 .  fluticasone (FLONASE) 50 MCG/ACT nasal spray, Place 2 sprays into both nostrils daily., Disp: 16 g, Rfl: 11 .  furosemide (LASIX) 40 MG tablet, Take 1 tablet (40 mg total) by mouth daily as needed for fluid., Disp: , Rfl:  .  ipratropium (ATROVENT) 0.03 % nasal spray, Place 2 sprays into both nostrils every 12 (twelve) hours., Disp: 30 mL, Rfl: 11 .  isosorbide mononitrate (IMDUR) 30 MG 24 hr tablet, TAKE 2 TABLETS IN THE AM AND 1 TABLET IN THE PM., Disp: 90 tablet, Rfl: 0 .  lamoTRIgine (LAMICTAL) 150 MG tablet, TAKE 1 TABLET EACH DAY., Disp: 90 tablet, Rfl: 1 .  lubiprostone (AMITIZA) 24 MCG capsule, Take 1 capsule (24 mcg total) by mouth 2 (two) times daily with a meal., Disp: 60 capsule, Rfl: 1 .  metoprolol succinate (TOPROL-XL) 50 MG 24 hr tablet, TAKE (1) TABLET DAILY IN THE MORNING. (Patient taking differently: Take 50 mg by mouth daily. ), Disp: 90 tablet, Rfl: 3 .  metroNIDAZOLE (METROGEL) 0.75 % gel, Apply 1 application topically 2 (two) times daily., Disp: 45 g, Rfl: 3 .  Multiple Vitamin (MULTIVITAMIN WITH MINERALS) TABS tablet, Take 1 tablet  by mouth daily., Disp: , Rfl:  .   ondansetron (ZOFRAN) 8 MG tablet, Take 1 tablet (8 mg total) by mouth every 8 (eight) hours as needed for nausea or vomiting., Disp: 30 tablet, Rfl: 6 .  oxyCODONE-acetaminophen (PERCOCET) 10-325 MG tablet, TAKE (1) TABLET EVERY SIX HOURS AS NEEDED FOR PAIN., Disp: 100 tablet, Rfl: 0 .  pantoprazole (PROTONIX) 40 MG tablet, TAKE 1 TABLET BY MOUTH DAILY. (Patient taking differently: Take 40 mg by mouth daily. ), Disp: 90 tablet, Rfl: 0 .  rosuvastatin (CRESTOR) 20 MG tablet, Take 1 tablet (20 mg total) by mouth daily., Disp: 90 tablet, Rfl: 3 .  solifenacin (VESICARE) 5 MG tablet, Take 1 tablet (5 mg total) by mouth daily., Disp: 30 tablet, Rfl: 1 .  traZODone (DESYREL) 50 MG tablet, TAKE 2-4 TABLETS AT BEDTIME (Patient taking differently: Take 150 mg by mouth at bedtime as needed for sleep. ), Disp: 120 tablet, Rfl: 5 .  Vitamin D, Ergocalciferol, (DRISDOL) 1.25 MG (50000 UT) CAPS capsule, TAKE (1) CAPSULE TWICE A WEEK. (Patient taking differently: Take 50,000 Units by mouth 2 (two) times a week. ), Disp: 24 capsule, Rfl: 0 .  nitroGLYCERIN (NITROSTAT) 0.4 MG SL tablet, Place 1 tablet (0.4 mg total) under the tongue every 5 (five) minutes as needed for chest pain (x 3 doses). (Patient not taking: Reported on 11/29/2019), Disp: 25 tablet, Rfl: 11 No current facility-administered medications for this visit.  Facility-Administered Medications Ordered in Other Visits:  .  ferumoxytol (FERAHEME) 510 mg in sodium chloride 0.9 % 100 mL IVPB, 510 mg, Intravenous, Once, Ennever, Rudell Cobb, MD  EXAM:  VITALS per patient if applicable: BP [id number] (BP Location: Left Arm, Patient Position: Sitting, Cuff Size: Normal)   Pulse 69   Ht 5\' 4"  (1.626 m)   Wt 152 lb (68.9 kg)   SpO2 (!) 87%   BMI 26.09 kg/m    GENERAL: alert, oriented, appears tired, sometimes speeks slow and has trouble with word finding.  No acute distress.  She can attend well. She is constantly licking her lips between periods of speech, but  no involuntary tongue thrusting or lips gestures. No dsytonia, no tremor.  HEENT: atraumatic, conjuntiva clear, no obvious abnormalities on inspection of external nose and ears  NECK: normal movements of the head and neck  LUNGS: on inspection no signs of respiratory distress, breathing rate appears normal, no obvious gross SOB, gasping or wheezing. She stops and catches her breath after 8-10 words.  CV: no obvious cyanosis  MS: moves all visible extremities without noticeable abnormality  PSYCH/NEURO: pleasant and cooperative, no obvious depression or anxiety, speech and thought processing grossly intact but at times she did not attend well, took long periods of time to think/find words.  LABS: none today    Chemistry      Component Value Date/Time   NA 138 10/22/2019 1155   NA 141 06/05/2015 1315   K 4.0 10/22/2019 1155   K 3.9 06/05/2015 1315   CL 102 10/22/2019 1155   CL 103 06/05/2015 1315   CO2 27 10/22/2019 1155   CO2 28 06/05/2015 1315   BUN 13 10/22/2019 1155   BUN 14 06/05/2015 1315   CREATININE 0.73 10/22/2019 1155   CREATININE 0.87 02/03/2017 1523      Component Value Date/Time   CALCIUM 8.6 10/22/2019 1155   CALCIUM 9.0 06/05/2015 1315   ALKPHOS 65 10/04/2019 1124   ALKPHOS 84 06/05/2015 1315   AST 22 10/04/2019 1124  AST 23 06/05/2015 1315   ALT 13 10/04/2019 1124   ALT 17 06/05/2015 1315   BILITOT 0.6 10/04/2019 1124   BILITOT 1.00 06/05/2015 1315     Lab Results  Component Value Date   WBC 6.0 10/07/2019   HGB 14.8 10/07/2019   HCT 45.5 10/07/2019   MCV 90.1 10/07/2019   PLT 216 10/07/2019   ASSESSMENT AND PLAN:  Discussed the following assessment and plan:  1) Hypoxia, hx of chronic hypoxic RF stemming from pleural/pulm asbestosis + RAD. She is not acting like her normal mental status.  Fortunately she is in NAD, but I feel like she needs emergency evaluation. Our nurse called 911 while pt on telemedicine for acute hypoxia with  lethargy/delirium-->although this may be a periodic "dip" in oxygen associated with her asbestos lung dz, I worry about covid 19 infection/peumonia.  Will eRx albuterol for her-->she says she has none. Refer to [facility] for ongoing mgmt of chronic hypoxic RF due to her asbestosis lung dz plus mgmt of her RAD which likely stems from her asbestos lung dz.  She formerly saw Dr. Camillo Flaming, pulm MD, but she refuses to return to him b/c she feels like she has no confidence/trust in him due to his repeatedly canceled visits.  -we discussed possible serious and likely etiologies, options for evaluation and workup, limitations of telemedicine visit vs in person visit, treatment, treatment risks and precautions. Pt prefers to treat via telemedicine empirically rather then risking or undertaking an in person visit at this moment. Patient agrees to seek prompt in person care if worsening, new symptoms arise, or if is not improving with treatment.   I discussed the assessment and treatment plan with the patient. The patient was provided an opportunity to ask questions and all were answered. The patient agreed with the plan and demonstrated an understanding of the instructions.   The patient was advised to call back or seek an in-person evaluation if the symptoms worsen or if the condition fails to improve as anticipated.  F/u: f/u to be determined based on ED w/u results/plan  Spent 45 min with pt today, with >50% of this time spent in counseling and care coordination regarding the above problems.  Signed:  Crissie Sickles, MD           12/06/2019

## 2019-12-06 NOTE — ED Notes (Signed)
Pt transported home by son Gershon Mussel.

## 2019-12-06 NOTE — ED Triage Notes (Signed)
EMS reports from [place], call by PCP conducting virtual visit, Pt c/o SOB, EMS states Pt was not on 02 on arrival. SATs on scene 90% RA. Pt stated she took herself of 02 a year ago. EMS reports AMS during encounter and unknown scattered pills strewn about, Staff states she is non-compliant with Meds. Hx of CHF.  BP 143/79 HR 67 RR 16 Sp02 90 RA CBG 104  20 wrist

## 2019-12-07 LAB — SARS CORONAVIRUS 2 (TAT 6-24 HRS): SARS Coronavirus 2: NEGATIVE

## 2019-12-08 ENCOUNTER — Other Ambulatory Visit: Payer: Self-pay | Admitting: Family Medicine

## 2019-12-10 NOTE — Telephone Encounter (Signed)
RF request for pantoprazole LOV:12/06/19 Next ov: n/a Last written: 09/01/19 (90,0)  RF request for trazodone LOV:12/06/19 Next ov: n/a Last written: 04/29/19(120,5)  Please Advise. Medications pending.

## 2019-12-13 ENCOUNTER — Other Ambulatory Visit: Payer: Self-pay | Admitting: Family Medicine

## 2019-12-20 ENCOUNTER — Ambulatory Visit: Payer: BLUE CROSS/BLUE SHIELD | Admitting: Registered Nurse

## 2019-12-29 ENCOUNTER — Telehealth: Payer: Self-pay

## 2019-12-29 NOTE — Telephone Encounter (Signed)
Received from for delayed certfication for skilled therapy services. PCP will review and sign, if appropriate.

## 2019-12-30 ENCOUNTER — Other Ambulatory Visit: Payer: Self-pay | Admitting: Cardiology

## 2019-12-31 ENCOUNTER — Telehealth: Payer: Self-pay

## 2019-12-31 NOTE — Telephone Encounter (Signed)
Received forms for OT plan of care. PCP will review and sign, if appropriate.

## 2020-01-03 NOTE — Telephone Encounter (Signed)
Signed and put in box to go up front.  

## 2020-01-09 ENCOUNTER — Other Ambulatory Visit: Payer: Self-pay | Admitting: Family Medicine

## 2020-01-10 ENCOUNTER — Telehealth: Payer: Self-pay | Admitting: Family Medicine

## 2020-01-10 NOTE — Telephone Encounter (Signed)
Unless she has any acute issue she wants to discuss then I think f/u visit in 6-8 weeks is fine--telemedicine is best. Also, I never ordered her referral to [facility] pulmonology like we discussed last visit, but if she is still ok with this plan then I'll order referral now.  Let me know-thx

## 2020-01-10 NOTE — Telephone Encounter (Signed)
Retrieived voicemail from patient   Patient wanted to know when her next appt is scheduled.  Nothing scheduled with [name].  Please advise.  Patient can be contacted at [phone].

## 2020-01-10 NOTE — Telephone Encounter (Signed)
OK, noted

## 2020-01-10 NOTE — Telephone Encounter (Signed)
Patient advised and voiced understanding. She has been scheduled for 02/21/20. She is going to call her old pulmonologist, Dr.Ejaz and see if he can see her. She will let us know about needing a referral.

## 2020-01-20 ENCOUNTER — Emergency Department (HOSPITAL_COMMUNITY): Payer: Medicare Other

## 2020-01-20 ENCOUNTER — Other Ambulatory Visit: Payer: Self-pay

## 2020-01-20 ENCOUNTER — Inpatient Hospital Stay (HOSPITAL_COMMUNITY)
Admission: EM | Admit: 2020-01-20 | Discharge: 2020-01-22 | DRG: 916 | Disposition: A | Discharge status: Home / Self Care | Payer: Medicare Other | Attending: Internal Medicine | Admitting: Internal Medicine

## 2020-01-20 ENCOUNTER — Observation Stay (HOSPITAL_COMMUNITY): Payer: Medicare Other

## 2020-01-20 DIAGNOSIS — Z86711 Personal history of pulmonary embolism: Secondary | ICD-10-CM

## 2020-01-20 DIAGNOSIS — R471 Dysarthria and anarthria: Secondary | ICD-10-CM

## 2020-01-20 DIAGNOSIS — Z7983 Long term (current) use of bisphosphonates: Secondary | ICD-10-CM

## 2020-01-20 DIAGNOSIS — M797 Fibromyalgia: Secondary | ICD-10-CM | POA: Diagnosis present

## 2020-01-20 DIAGNOSIS — Z820 Family history of epilepsy and other diseases of the nervous system: Secondary | ICD-10-CM

## 2020-01-20 DIAGNOSIS — Z880 Allergy status to penicillin: Secondary | ICD-10-CM

## 2020-01-20 DIAGNOSIS — E785 Hyperlipidemia, unspecified: Secondary | ICD-10-CM | POA: Diagnosis present

## 2020-01-20 DIAGNOSIS — R4781 Slurred speech: Secondary | ICD-10-CM | POA: Diagnosis not present

## 2020-01-20 DIAGNOSIS — Z20822 Contact with and (suspected) exposure to covid-19: Secondary | ICD-10-CM | POA: Diagnosis present

## 2020-01-20 DIAGNOSIS — J449 Chronic obstructive pulmonary disease, unspecified: Secondary | ICD-10-CM | POA: Diagnosis present

## 2020-01-20 DIAGNOSIS — Z823 Family history of stroke: Secondary | ICD-10-CM

## 2020-01-20 DIAGNOSIS — G2111 Neuroleptic induced parkinsonism: Secondary | ICD-10-CM | POA: Diagnosis present

## 2020-01-20 DIAGNOSIS — I110 Hypertensive heart disease with heart failure: Secondary | ICD-10-CM | POA: Diagnosis present

## 2020-01-20 DIAGNOSIS — Z8261 Family history of arthritis: Secondary | ICD-10-CM

## 2020-01-20 DIAGNOSIS — Z833 Family history of diabetes mellitus: Secondary | ICD-10-CM

## 2020-01-20 DIAGNOSIS — F419 Anxiety disorder, unspecified: Secondary | ICD-10-CM | POA: Diagnosis present

## 2020-01-20 DIAGNOSIS — K449 Diaphragmatic hernia without obstruction or gangrene: Secondary | ICD-10-CM | POA: Diagnosis present

## 2020-01-20 DIAGNOSIS — I4510 Unspecified right bundle-branch block: Secondary | ICD-10-CM | POA: Diagnosis present

## 2020-01-20 DIAGNOSIS — T783XXA Angioneurotic edema, initial encounter: Secondary | ICD-10-CM | POA: Diagnosis not present

## 2020-01-20 DIAGNOSIS — I5032 Chronic diastolic (congestive) heart failure: Secondary | ICD-10-CM | POA: Diagnosis not present

## 2020-01-20 DIAGNOSIS — Z7982 Long term (current) use of aspirin: Secondary | ICD-10-CM

## 2020-01-20 DIAGNOSIS — Z888 Allergy status to other drugs, medicaments and biological substances status: Secondary | ICD-10-CM

## 2020-01-20 DIAGNOSIS — R29810 Facial weakness: Secondary | ICD-10-CM | POA: Diagnosis present

## 2020-01-20 DIAGNOSIS — G4733 Obstructive sleep apnea (adult) (pediatric): Secondary | ICD-10-CM | POA: Diagnosis present

## 2020-01-20 DIAGNOSIS — R531 Weakness: Secondary | ICD-10-CM | POA: Diagnosis not present

## 2020-01-20 DIAGNOSIS — I10 Essential (primary) hypertension: Secondary | ICD-10-CM | POA: Diagnosis present

## 2020-01-20 DIAGNOSIS — F332 Major depressive disorder, recurrent severe without psychotic features: Secondary | ICD-10-CM | POA: Diagnosis present

## 2020-01-20 DIAGNOSIS — E673 Hypervitaminosis D: Secondary | ICD-10-CM | POA: Diagnosis present

## 2020-01-20 DIAGNOSIS — I4891 Unspecified atrial fibrillation: Secondary | ICD-10-CM | POA: Diagnosis not present

## 2020-01-20 DIAGNOSIS — R2689 Other abnormalities of gait and mobility: Secondary | ICD-10-CM | POA: Diagnosis present

## 2020-01-20 DIAGNOSIS — G4700 Insomnia, unspecified: Secondary | ICD-10-CM | POA: Diagnosis present

## 2020-01-20 DIAGNOSIS — Z8249 Family history of ischemic heart disease and other diseases of the circulatory system: Secondary | ICD-10-CM

## 2020-01-20 DIAGNOSIS — R29818 Other symptoms and signs involving the nervous system: Secondary | ICD-10-CM | POA: Diagnosis not present

## 2020-01-20 DIAGNOSIS — R0902 Hypoxemia: Secondary | ICD-10-CM | POA: Diagnosis not present

## 2020-01-20 DIAGNOSIS — I2510 Atherosclerotic heart disease of native coronary artery without angina pectoris: Secondary | ICD-10-CM | POA: Diagnosis present

## 2020-01-20 DIAGNOSIS — M5136 Other intervertebral disc degeneration, lumbar region: Secondary | ICD-10-CM | POA: Diagnosis present

## 2020-01-20 DIAGNOSIS — Z9981 Dependence on supplemental oxygen: Secondary | ICD-10-CM

## 2020-01-20 DIAGNOSIS — J9611 Chronic respiratory failure with hypoxia: Secondary | ICD-10-CM | POA: Diagnosis present

## 2020-01-20 DIAGNOSIS — R269 Unspecified abnormalities of gait and mobility: Secondary | ICD-10-CM | POA: Diagnosis present

## 2020-01-20 DIAGNOSIS — J984 Other disorders of lung: Secondary | ICD-10-CM

## 2020-01-20 DIAGNOSIS — K5790 Diverticulosis of intestine, part unspecified, without perforation or abscess without bleeding: Secondary | ICD-10-CM | POA: Diagnosis present

## 2020-01-20 DIAGNOSIS — F329 Major depressive disorder, single episode, unspecified: Secondary | ICD-10-CM | POA: Diagnosis present

## 2020-01-20 DIAGNOSIS — N3946 Mixed incontinence: Secondary | ICD-10-CM | POA: Diagnosis present

## 2020-01-20 DIAGNOSIS — G8929 Other chronic pain: Secondary | ICD-10-CM | POA: Diagnosis present

## 2020-01-20 DIAGNOSIS — Z79899 Other long term (current) drug therapy: Secondary | ICD-10-CM

## 2020-01-20 DIAGNOSIS — Z841 Family history of disorders of kidney and ureter: Secondary | ICD-10-CM

## 2020-01-20 LAB — I-STAT CHEM 8, ED
BUN: 14 mg/dL (ref 8–23)
Calcium, Ion: 1.06 mmol/L — ABNORMAL LOW (ref 1.15–1.40)
Chloride: 105 mmol/L (ref 98–111)
Creatinine, Ser: 0.8 mg/dL (ref 0.44–1.00)
Glucose, Bld: 90 mg/dL (ref 70–99)
HCT: 42 % (ref 36.0–46.0)
Hemoglobin: 14.3 g/dL (ref 12.0–15.0)
Potassium: 3.7 mmol/L (ref 3.5–5.1)
Sodium: 137 mmol/L (ref 135–145)
TCO2: 23 mmol/L (ref 22–32)

## 2020-01-20 LAB — CBC
HCT: 44.7 % (ref 36.0–46.0)
Hemoglobin: 14.1 g/dL (ref 12.0–15.0)
MCH: 28.8 pg (ref 26.0–34.0)
MCHC: 31.5 g/dL (ref 30.0–36.0)
MCV: 91.4 fL (ref 80.0–100.0)
Platelets: 250 10*3/uL (ref 150–400)
RBC: 4.89 MIL/uL (ref 3.87–5.11)
RDW: 12.5 % (ref 11.5–15.5)
WBC: 8.5 10*3/uL (ref 4.0–10.5)
nRBC: 0 % (ref 0.0–0.2)

## 2020-01-20 LAB — COMPREHENSIVE METABOLIC PANEL
ALT: 15 U/L (ref 0–44)
AST: 21 U/L (ref 15–41)
Albumin: 3.9 g/dL (ref 3.5–5.0)
Alkaline Phosphatase: 62 U/L (ref 38–126)
Anion gap: 11 (ref 5–15)
BUN: 13 mg/dL (ref 8–23)
CO2: 20 mmol/L — ABNORMAL LOW (ref 22–32)
Calcium: 8.9 mg/dL (ref 8.9–10.3)
Chloride: 106 mmol/L (ref 98–111)
Creatinine, Ser: 0.96 mg/dL (ref 0.44–1.00)
GFR calc Af Amer: >60 mL/min (ref >60)
GFR calc non Af Amer: 59 mL/min — ABNORMAL LOW (ref >60)
Glucose, Bld: 94 mg/dL (ref 70–99)
Potassium: 3.7 mmol/L (ref 3.5–5.1)
Sodium: 137 mmol/L (ref 135–145)
Total Bilirubin: 0.5 mg/dL (ref 0.3–1.2)
Total Protein: 6.6 g/dL (ref 6.5–8.1)

## 2020-01-20 LAB — PROTIME-INR
INR: 1 (ref 0.8–1.2)
Prothrombin Time: 13.3 seconds (ref 11.4–15.2)

## 2020-01-20 LAB — DIFFERENTIAL
Abs Immature Granulocytes: 0.01 10*3/uL (ref 0.00–0.07)
Basophils Absolute: 0 10*3/uL (ref 0.0–0.1)
Basophils Relative: 0 %
Eosinophils Absolute: 0.1 10*3/uL (ref 0.0–0.5)
Eosinophils Relative: 1 %
Immature Granulocytes: 0 %
Lymphocytes Relative: 25 %
Lymphs Abs: 2.1 10*3/uL (ref 0.7–4.0)
Monocytes Absolute: 0.5 10*3/uL (ref 0.1–1.0)
Monocytes Relative: 5 %
Neutro Abs: 5.8 10*3/uL (ref 1.7–7.7)
Neutrophils Relative %: 69 %

## 2020-01-20 LAB — CBG MONITORING, ED: Glucose-Capillary: 89 mg/dL (ref 70–99)

## 2020-01-20 LAB — APTT: aPTT: 34 seconds (ref 24–36)

## 2020-01-20 MED ORDER — METHYLPREDNISOLONE SODIUM SUCC 125 MG IJ SOLR
125.0000 mg | Freq: Once | INTRAMUSCULAR | Status: AC
  Administered 2020-01-20: 125 mg via INTRAVENOUS

## 2020-01-20 MED ORDER — DIPHENHYDRAMINE HCL 25 MG PO CAPS: 25.0000 mg | ORAL_CAPSULE | Freq: Once | ORAL | Status: DC

## 2020-01-20 MED ORDER — SODIUM CHLORIDE 0.9% FLUSH
3.0000 mL | Freq: Once | INTRAVENOUS | Status: DC
Start: 2020-01-20 — End: 2020-01-22

## 2020-01-20 MED ORDER — DIPHENHYDRAMINE HCL 50 MG/ML IJ SOLN
25.0000 mg | Freq: Once | INTRAMUSCULAR | Status: AC
  Administered 2020-01-20: 25 mg via INTRAVENOUS

## 2020-01-20 MED ORDER — METHYLPREDNISOLONE SODIUM SUCC 125 MG IJ SOLR
60.0000 mg | Freq: Four times a day (QID) | INTRAMUSCULAR | Status: DC
  Administered 2020-01-21 (×3): 60 mg via INTRAVENOUS
  Filled 2020-01-20 (×3): qty 2

## 2020-01-20 MED ORDER — ENOXAPARIN SODIUM 40 MG/0.4ML ~~LOC~~ SOLN
40.0000 mg | Freq: Every day | SUBCUTANEOUS | Status: DC
  Administered 2020-01-21 – 2020-01-22 (×2): 40 mg via SUBCUTANEOUS
  Filled 2020-01-20 (×2): qty 0.4

## 2020-01-20 MED ORDER — EPINEPHRINE 0.3 MG/0.3ML IJ SOAJ
0.3000 mg | Freq: Once | INTRAMUSCULAR | Status: AC
  Administered 2020-01-20: 0.3 mg via INTRAMUSCULAR

## 2020-01-20 MED ORDER — LORATADINE 10 MG PO TABS
10.0000 mg | ORAL_TABLET | Freq: Every day | ORAL | Status: DC
  Administered 2020-01-21 – 2020-01-22 (×2): 10 mg via ORAL
  Filled 2020-01-20 (×2): qty 1

## 2020-01-20 MED ORDER — IPRATROPIUM-ALBUTEROL 0.5-2.5 (3) MG/3ML IN SOLN: 3.0000 mL | Freq: Four times a day (QID) | RESPIRATORY_TRACT | Status: DC | PRN

## 2020-01-20 NOTE — H&P (Addendum)
History and Physical    Linda [name] YTK:[id number] DOB: January 23, 1946 DOA: 01/20/2020  PCP: Tammi Sou, MD Patient coming from: Home  Chief Complaint: Code stroke  HPI: Linda [name] is a 74 y.o. female with medical history significant of idiopathic angioedema, nonobstructive CAD, chronic diastolic congestive heart failure, COPD on 3 L home oxygen, PE, neuroleptic induced parkinsonism, hypertension, fibromyalgia, anxiety, asthma, and conditions listed below presenting to the ED with complaints of slurred speech, facial droop, and swelling of lips and tongue.  Patient states in the afternoon around 3 PM she noticed that her lips and tongue were swollen.  She was having difficulty speaking.  She was told that her face was drooping.  Denies any recent insect sting.  She has not tried any new foods.  No new make-up or lipstick.  No additional history could be obtained from her.  ED Course: On arrival, patient noted to have swollen lips and tongue.  No dyspnea, stridor, or drooling.  No signs of airway obstruction.  Head CT negative for acute stroke.  Patient was seen by neurology and no facial droop noted.  Her dysarthria was thought to be secondary to edema of lips and tongue.  In addition, she is on multiple sedating medications at home which were also thought to be possibly contributing.  Brain MRI was done which also did not show evidence of acute intracranial abnormality. Patient received EpiPen, Benadryl, Solu-Medrol  Review of Systems:  All systems reviewed and apart from history of presenting illness, are negative.  Past Medical History:  Diagnosis Date  . Acute upper GI bleed 06/2014   while pt taking coumadin, plavix, and meloxicam---despite being told not to take coumadin.  . Anginal pain ([place])    Nonobstructive CAD 2014; however, her cardiologist put her on a statin for this and NOT for hyperlipidemia per pt report.  Atyp CP 08/2017 at card f/u, plan for myoc perf imaging.    . Anxiety    panic attacks  . Asthma    w/ asbestososis   . BPPV (benign paroxysmal positional vertigo) 12/16/2012  . Chronic diastolic CHF (congestive heart failure) (HCC)    dry wt as of 11/06/16 is 168 lbs.  . Chronic lower back pain   . COPD (chronic obstructive pulmonary disease) ([place])   . DDD (degenerative disc disease)    lumbar and cervical.   . Diverticular disease   . Fibromyalgia    Patient states dx was around her late 28s but she had sx's for years prior to this.  . H/O hiatal hernia   . Hay fever   . History of pneumonia    hospitalized 12/2011, 02/2013, and 07/2013 [facility]) for this  . HTN (hypertension)    Renal artery dopplers 04/2013 neg for stenosis.  . Hypervitaminosis D 09/27/2019   over-supplemented.  Stopped vit D and plan recheck 2 mo.  . Idiopathic angio-edema-urticaria 72014   Angioedema component was very minimal  . Insomnia   . Iron deficiency anemia    Hematologist in [place], [place] did extensive w/u; no cause found; failed oral supplement;; gets fairly regular (q56mor so) IV iron infusions (Venofer -iron sucrose- 2049mwith procrit.  "for 14 yr I've been getting blood work q month & getting infusions prn" (07/12/2013).  Dr. EnMarin Olpocally, iron infusions done, EPO deficiency dx'd  . Migraine syndrome    "not as often anymore; used to be ~ q wk" (07/12/2013)  . Mixed incontinence urge and stress   .  Nephrolithiasis    "passed all on my own or they are still in there" (07/12/2013)  . Neuroleptic induced Parkinsonism ([place]) 2018   Dr. Carles Collet, neuro, saw her 11/24/17 and recommended d/c of abilify as first step.  D/c'd abilify and pt got complete recovery.  . OSA on CPAP    prior to move to [place]--had another sleep study 10/2015 w/pulm Dr. Camillo Flaming.  . Osteoarthritis    "severe; progressing fast" (07/12/2013); multiple joints-not surgical candidate for TKR (03/2015).  Triamcinolon knee injections by Dr. Tessa Lerner 12/2017.  [name] Kitchen Pernicious anemia 08/24/2014  . Pleural plaque with  presence of asbestos 07/22/2013  . Pulmonary embolism ([place]) 07/2013   Dx at [facility] with very small peripheral upper lobe pe 07/2013: pt took coumadin for about 8-9 mo  . Pyelonephritis    "several times over the last 30 yr" (07/12/2013)  . RBBB (right bundle branch block)   . Recurrent major depression ([place])   . Recurrent UTI    hx of hospitalization for pyelonephritis; started abx prophylaxis 06/2015  . Syncope    Hypotensive; ED visit--Dr. Terrence Dupont did Cath--nonobstructive CAD, EF 55-60%.  In retrospect, suspect pt rx med misuse/polypharmacy  . Tension headache, chronic     Past Surgical History:  Procedure Laterality Date  . APPENDECTOMY  1960  . AXILLARY SURGERY Left 1978   Multiple "lump" in armpit per pt  . CARDIAC CATHETERIZATION  01/2013   nonobstructive CAD, EF 55-60%  . CARDIOVASCULAR STRESS TEST  02/22/15   Low risk myocard perf imaging; wall motion normal, normal EF  . carotid duplex doppler  10/21/2017   R vertebral flow suggestive of possible distal obstruction.  Pt declined further w/u as of 10/29/17 but need to revisit this problem periodically.  . COCCYX REMOVAL  1972  . DEXA  06/05/2017   T-score -3.1  . DILATION AND CURETTAGE OF UTERUS  ? 1970's  . ESOPHAGOGASTRODUODENOSCOPY N/A 07/19/2014   Gastritis found + in the setting of supratherapeutic INR, +plavix, + meloxicam.  . EYE SURGERY Left 2012-2013   "injections for ~ 1 yr; don't really know what for" (07/12/2013)  . HEEL SPUR SURGERY Left 2008  . KNEE SURGERY  2005  . LEFT HEART CATHETERIZATION WITH CORONARY ANGIOGRAM N/A 01/30/2013   Procedure: LEFT HEART CATHETERIZATION WITH CORONARY ANGIOGRAM;  Surgeon: Clent Demark, MD;  Location: [facility] CATH LAB;  Service: Cardiovascular;  Laterality: N/A;  . PLANTAR FASCIA RELEASE Left 2008  . SPIROMETRY  04/25/14   In hosp for acute asthma/COPD flare: mixed obstructive and restrictive lung disease. The FEV1 is severely reduced at 45% predicted.  FEV1 signif decreased compared to prior  spirometry 07/23/13.  . TENDON RELEASE  1996   Right forearm and hand  . TOTAL ABDOMINAL HYSTERECTOMY  1974  . TRANSTHORACIC ECHOCARDIOGRAM  01/2013; 04/2014;08/2015; 09/2017   2014--NORMAL.  2015--focal basal septal hypertrophy, EF 55-60%, grade I diast dysfxn, mild LAE.  08/2015 EF 55-60%, nl LV syst fxn, grade I DD, valves wnl. 10/21/17: EF 65-70%, grd I DD, o/w normal.     reports that she has never smoked. She has never used smokeless tobacco. She reports that she does not drink alcohol or use drugs.  Allergies  Allergen Reactions  . Abilify [Aripiprazole] Other (See Comments)    parkinsonism  . Penicillins Itching, Swelling and Rash    Tolerated Cefepime in ED. Has patient had a PCN reaction causing immediate rash, facial/tongue/throat swelling, SOB or lightheadedness with hypotension: Yes Has patient had a PCN reaction causing  severe rash involving mucus membranes or skin necrosis: No Has patient had a PCN reaction that required hospitalization: No  Has patient had a PCN reaction occurring within the last 10 years: No     Family History  Problem Relation Age of Onset  . Arthritis Mother   . Kidney disease Mother   . Heart disease Father   . Stroke Father   . Hypertension Father   . Diabetes Father   . Heart attack Father   . Heart attack Paternal Grandmother   . Diabetes Sister        one sister  . Hypertension Sister   . Hypertension Brother   . Multiple sclerosis Son     Prior to Admission medications   Medication Sig Start Date End Date Taking? Authorizing Provider  albuterol (VENTOLIN HFA) 108 (90 Base) MCG/ACT inhaler Inhale 1-2 puffs into the lungs every 6 (six) hours as needed for wheezing or shortness of breath. 12/06/19   McGowen, Adrian Blackwater, MD  alendronate (FOSAMAX) 70 MG tablet Take 1 tablet (70 mg total) by mouth every 7 (seven) days. Take with a full glass of water on an empty stomach, first thing in the morning and remain up right for 30 minutes after taking.  07/26/19   McGowen, Adrian Blackwater, MD  ALPRAZolam (XANAX) 1 MG tablet TAKE 1 TABLET THREE TIMES DAILY AS NEEDED FOR ANXIETY. Patient taking differently: Take 1 mg by mouth 3 (three) times daily as needed for anxiety.  10/11/19   McGowen, Adrian Blackwater, MD  aspirin 81 MG tablet Take 81 mg by mouth at bedtime.    [provider]  ciprofloxacin (CIPRO) 250 MG tablet Take 1 tablet (250 mg total) by mouth 2 (two) times daily. 10/22/19   McGowen, Adrian Blackwater, MD  diclofenac sodium (VOLTAREN) 1 % GEL Apply 2 g topically 4 (four) times daily. 10/20/19   Meredith Staggers, MD  donepezil (ARICEPT) 10 MG tablet Take 1 tablet (10 mg total) by mouth at bedtime. 09/28/19   McGowen, Adrian Blackwater, MD  DULoxetine (CYMBALTA) 30 MG capsule TAKE 1 CAPSULE ONCE DAILY ALONG WITH 60MG CAPSULE FOR TOTAL 90MG DAILY. 01/10/20   McGowen, Adrian Blackwater, MD  DULoxetine (CYMBALTA) 60 MG capsule TAKE 1 CAPSULE A DAY TO BE COMBINED WITH 30MG CAPSULE Patient taking differently: Take 60 mg by mouth daily. Take with 22m to total 939mdaily. 07/27/19   McGowen, PhAdrian BlackwaterMD  fluconazole (DIFLUCAN) 100 MG tablet 1 tab po qd prn thrush Patient taking differently: Take 100 mg by mouth daily as needed (Thrush).  07/26/19   McGowen, PhAdrian BlackwaterMD  fluticasone (FLONASE) 50 MCG/ACT nasal spray Place 2 sprays into both nostrils daily. 09/27/19   McGowen, PhAdrian BlackwaterMD  furosemide (LASIX) 40 MG tablet Take 1 tablet (40 mg total) by mouth daily as needed for fluid. 10/07/19   KyCherylann Ratel, DO  ipratropium (ATROVENT) 0.03 % nasal spray Place 2 sprays into both nostrils every 12 (twelve) hours. 09/27/19   McGowen, PhAdrian BlackwaterMD  isosorbide mononitrate (IMDUR) 30 MG 24 hr tablet TAKE 2 TABLETS IN THE AM AND 1 TABLET IN THE PM. 12/31/19   CrLelon PerlaMD  lamoTRIgine (LAMICTAL) 150 MG tablet TAKE 1 TABLET EACH DAY. 11/22/19   McGowen, PhAdrian BlackwaterMD  lubiprostone (AMITIZA) 24 MCG capsule Take 1 capsule (24 mcg total) by mouth 2 (two) times daily with a meal. 10/22/19    McGowen, PhAdrian BlackwaterMD  metoprolol succinate (TOPROL-XL) 50 MG  24 hr tablet TAKE (1) TABLET DAILY IN THE MORNING. Patient taking differently: Take 50 mg by mouth daily.  07/27/19   McGowen, Adrian Blackwater, MD  metroNIDAZOLE (METROGEL) 0.75 % gel Apply 1 application topically 2 (two) times daily. 11/29/19   McGowen, Adrian Blackwater, MD  Multiple Vitamin (MULTIVITAMIN WITH MINERALS) TABS tablet Take 1 tablet by mouth daily.    [provider]  nitroGLYCERIN (NITROSTAT) 0.4 MG SL tablet Place 1 tablet (0.4 mg total) under the tongue every 5 (five) minutes as needed for chest pain (x 3 doses). Patient not taking: Reported on 11/29/2019 09/23/19   Lelon Perla, MD  ondansetron (ZOFRAN) 8 MG tablet Take 1 tablet (8 mg total) by mouth every 8 (eight) hours as needed for nausea or vomiting. 10/22/19   McGowen, Adrian Blackwater, MD  oxyCODONE-acetaminophen (PERCOCET) 10-325 MG tablet TAKE (1) TABLET EVERY SIX HOURS AS NEEDED FOR PAIN. 12/01/19   Meredith Staggers, MD  pantoprazole (PROTONIX) 40 MG tablet TAKE 1 TABLET BY MOUTH DAILY. 12/10/19   McGowen, Adrian Blackwater, MD  rosuvastatin (CRESTOR) 20 MG tablet Take 1 tablet (20 mg total) by mouth daily. 09/23/19 12/22/19  Lelon Perla, MD  solifenacin (VESICARE) 5 MG tablet TAKE 1 TABLET ONCE DAILY. 12/14/19   Tammi Sou, MD  traZODone (DESYREL) 50 MG tablet TAKE 2-4 TABLETS AT BEDTIME 12/10/19   McGowen, Adrian Blackwater, MD  Vitamin D, Ergocalciferol, (DRISDOL) 1.25 MG (50000 UT) CAPS capsule TAKE (1) CAPSULE TWICE A WEEK. Patient taking differently: Take 50,000 Units by mouth 2 (two) times a week.  07/27/19   Tammi Sou, MD    Physical Exam: Vitals:   01/20/20 2030 01/20/20 2100 01/20/20 2115 01/20/20 2300  BP: 119/60 123/78 124/69 128/61  Pulse: 71 82 75 67  Resp: _0 Temp:      TempSrc:      SpO2: 95% 97% 93% 96%  Weight:      Height:        Physical Exam  Constitutional: She is oriented to person, place, and time. She appears well-developed  and well-nourished. No distress.  HENT:  Head: Normocephalic.  Mouth/Throat: Oropharynx is clear and moist.  Lips and tongue appear swollen No drooling  Eyes: Right eye exhibits no discharge. Left eye exhibits no discharge.  Cardiovascular: Normal rate, regular rhythm and intact distal pulses.  Pulmonary/Chest: Effort normal and breath sounds normal. No respiratory distress. She has no wheezes. She has no rales.  No stridor  Abdominal: Soft. Bowel sounds are normal. She exhibits no distension. There is no abdominal tenderness. There is no guarding.  Musculoskeletal:        General: No edema.     Cervical back: Neck supple.  Neurological: She is alert and oriented to person, place, and time.  Speech slightly dysarthric No facial droop No focal motor or sensory deficit  Skin: Skin is warm and dry. She is not diaphoretic.     Labs on Admission: I have personally reviewed following labs and imaging studies  CBC: Recent Labs  Lab 01/20/20 1712 01/20/20 1720  WBC 8.5  --   NEUTROABS 5.8  --   HGB 14.1 14.3  HCT 44.7 42.0  MCV 91.4  --   PLT 250  --    Basic Metabolic Panel: Recent Labs  Lab 01/20/20 1712 01/20/20 1720  NA 137 137  K 3.7 3.7  CL 106 105  CO2 20*  --   GLUCOSE 94 90  BUN  13 14  CREATININE 0.96 0.80  CALCIUM 8.9  --    GFR: Estimated Creatinine Clearance: 60.1 mL/min (by C-G formula based on SCr of 0.8 mg/dL). Liver Function Tests: Recent Labs  Lab 01/20/20 1712  AST 21  ALT 15  ALKPHOS 62  BILITOT 0.5  PROT 6.6  ALBUMIN 3.9   No results for input(s): LIPASE, AMYLASE in the last 168 hours. No results for input(s): AMMONIA in the last 168 hours. Coagulation Profile: Recent Labs  Lab 01/20/20 1712  INR 1.0   Cardiac Enzymes: No results for input(s): CKTOTAL, CKMB, CKMBINDEX, TROPONINI in the last 168 hours. BNP (last 3 results) No results for input(s): PROBNP in the last 8760 hours. HbA1C: No results for input(s): HGBA1C in the last 72  hours. CBG: Recent Labs  Lab 01/20/20 1712  GLUCAP 89   Lipid Profile: No results for input(s): CHOL, HDL, LDLCALC, TRIG, CHOLHDL, LDLDIRECT in the last 72 hours. Thyroid Function Tests: No results for input(s): TSH, T4TOTAL, FREET4, T3FREE, THYROIDAB in the last 72 hours. Anemia Panel: No results for input(s): VITAMINB12, FOLATE, FERRITIN, TIBC, IRON, RETICCTPCT in the last 72 hours. Urine analysis:    Component Value Date/Time   COLORURINE YELLOW 10/04/2019 [place] 10/04/2019 1305   LABSPEC 1.004 (L) 10/04/2019 1305   PHURINE 6.0 10/04/2019 1305   GLUCOSEU NEGATIVE 10/04/2019 1305   HGBUR NEGATIVE 10/04/2019 1305   [place] 10/04/2019 1305   BILIRUBINUR Negative 08/07/2018 1437   KETONESUR NEGATIVE 10/04/2019 1305   PROTEINUR NEGATIVE 10/04/2019 1305   UROBILINOGEN 0.2 08/07/2018 1437   UROBILINOGEN 1.0 10/16/2015 2009   NITRITE POSITIVE (A) 10/04/2019 1305   LEUKOCYTESUR NEGATIVE 10/04/2019 1305    Radiological Exams on Admission: MR BRAIN WO CONTRAST  Result Date: 01/20/2020 CLINICAL DATA:  74 year old female code stroke presentation today. Slurred speech, facial droop. EXAM: MRI HEAD WITHOUT CONTRAST TECHNIQUE: Multiplanar, multiecho pulse sequences of the brain and surrounding structures were obtained without intravenous contrast. COMPARISON:  Head CT 1728 hours today. Brain MRI 09/03/2014. FINDINGS: Brain: No restricted diffusion to suggest acute infarction. No midline shift, mass effect, evidence of mass lesion, ventriculomegaly, extra-axial collection or acute intracranial hemorrhage. Cervicomedullary junction and pituitary are within normal limits. Evidence of a small chronic lacunar infarct of the right caudate nucleus (series 10 image 15) which is new or larger since 2015. Otherwise largely normal for age gray and white matter signal throughout the brain; mild for age nonspecific scattered white matter T2 and FLAIR hyperintensity. No  cortical encephalomalacia or chronic cerebral blood products identified. Other deep gray nuclei, brainstem and cerebellum appear normal. Vascular: Major intracranial vascular flow voids are preserved and appear stable since 2015. Skull and upper cervical spine: Negative visible cervical spine. Visualized bone marrow signal is within normal limits. Sinuses/Orbits: Postoperative changes to both globes otherwise negative orbits. Paranasal sinuses and mastoids are stable and well pneumatized. Other: Visible internal auditory structures appear normal. Scalp and face soft tissues appear negative. IMPRESSION: No acute intracranial abnormality and largely normal for age noncontrast MRI appearance of the brain. Small chronic lacune of the right caudate nucleus. Electronically Signed   By: Genevie Ann M.D.   On: 01/20/2020 22:19   CT HEAD CODE STROKE WO CONTRAST  Result Date: 01/20/2020 CLINICAL DATA:  Code stroke.  74 year old female EXAM: CT HEAD WITHOUT CONTRAST TECHNIQUE: Contiguous axial images were obtained from the base of the skull through the vertex without intravenous contrast. COMPARISON:  Head CT 12/06/2019. Brain MRI 08/24/2014. FINDINGS:  Brain: Cerebral volume is stable. No acute intracranial hemorrhage identified. No midline shift, mass effect, or evidence of intracranial mass lesion. Stable gray-white matter differentiation throughout the brain. No cortically based acute infarct identified. No cortical encephalomalacia identified. Vascular: Calcified atherosclerosis at the skull base. No suspicious intracranial vascular hyperdensity. Skull: No acute osseous abnormality identified. Sinuses/Orbits: Visualized paranasal sinuses and mastoids are stable and well pneumatized. Benign appearing bone lesion left maxillary sinus alveolar recess on series 3, image 4. Other: No acute orbit or scalp soft tissue findings. ASPECTS [facility] Stroke Program Early CT Score) Total score (0-10 with 10 being normal): 10 IMPRESSION:  1. Stable and negative for age noncontrast CT appearance of the brain. 2. ASPECTS is 10. 3. These results were communicated to Dr. Rory Percy at 5:41 pm on 01/20/2020 by text page via the [facility] messaging system. Electronically Signed   By: Genevie Ann M.D.   On: 01/20/2020 17:41    EKG: Independently reviewed.  Sinus rhythm, RBBB, QTC 505.  QT interval increased since prior tracing.  Assessment/Plan Principal Problem:   Angioedema Active Problems:   HTN (hypertension), benign   Chronic diastolic CHF, NYHA class 1   COPD (chronic obstructive pulmonary disease) (HCC)   Dysarthria   Recurrent angioedema Patient noted to have swollen lips and tongue.  No dyspnea, stridor, or drooling.  No signs of airway obstruction.  Angioedema could be possibly due to an allergic component but she does have a documented history of idiopathic angioedema.  Takes aspirin at home which could possibly be contributing.  No ACE inhibitor or ARB listed in home medications.  Patient received EpiPen, Benadryl, and Solu-Medrol 125 mg. -Monitor closely for signs of airway compromise -Continuous pulse ox, supplemental oxygen as needed -Received EpiPen by EMS -Continue Solu-Medrol 60 mg every 6 hours with plan to switch to oral steroids when there is clinical improvement -Claritin daily -Check ESR, CRP, complement C4 levels -Hold aspirin -Please ensure follow-up with an allergist.  Dysarthria CT and brain MRI not suggestive of acute stroke.  Seen by neurology and her dysarthria is thought to be secondary to edema of lips and tongue.  In addition, takes multiple sedating medications at home which could also possibly be contributing. -Continue to monitor -Avoid sedating medications  Chronic diastolic congestive heart failure Stable.  Currently euvolemic on exam. -Hold diuretic  COPD Currently stable on 2 L home oxygen.  No wheezing, cough, or shortness of breath. -Combivent inhaler as needed -Continue home  oxygen  Hypertension -Stable.  Currently normotensive.  Pharmacy med rec pending.  DVT prophylaxis: Lovenox Code Status: Discussed CODE STATUS with the patient and her son over the phone.  Patient is okay with intubation and mechanical ventilation if her respiratory status worsened.  She does not want CPR, defibrillation, or ACLS medications. Disposition Plan: Anticipate discharge after clinical improvement. Consults called: Neurology Admission status: It is my clinical opinion that referral for OBSERVATION is reasonable and necessary in this patient based on the above information provided. The aforementioned taken together are felt to place the patient at high risk for further clinical deterioration. However it is anticipated that the patient may be medically stable for discharge from the hospital within 24 to 48 hours.  The medical decision making on this patient was of high complexity and the patient is at high risk for clinical deterioration, therefore this is a level 3 visit.  Shela Leff MD [facility]  If 7PM-7AM, please contact night-coverage [email or web address] Password [facility]  01/20/2020, 11:39 PM

## 2020-01-20 NOTE — Consult Note (Signed)
Neurology Consultation  Reason for Consult: Code stroke Referring Physician: Chinita Pester, MD  CC: Slurred speech  History is obtained from: Patient  HPI: Linda [name] is a 74 y.o. female with history of syncope, pulmonary embolism no longer on anticoagulant, neuroleptic induced parkinsonism, migraine syndrome, hypertension.  Patient lives at [facility] and was getting ready to attend a party at the facility when her staff noted that she had slurred speech and face did not look right.  EMS was called and brought patient in as a code stroke.  On arrival patient was noted to have enlarged lips and also enlarged tongue.  Presentation appeared to be more of an allergic reaction with both tongue and lip edema.  Patient denied any new lipstick, perfume, cosmetics, detergent.  She denied eating any food prior to the symptoms.  While on EMS stretcher at the bridge, patient was given 1 EpiPen.  Other than some decreased sensation on the lower left leg she denied any other symptoms.  Due to symptoms not resolving after EpiPen she was brought to CT.  While on CT table she did describe some difficulty swallowing.  No other complaints.  ED course  Relevant labs include -calcium 1.06, CBG 89, CT head shows-no acute stroke  LKW: 1400 hrs. tpa given?: no, minimal symptoms with most likely allergic reaction Premorbid modified Rankin scale (mRS): 1 NIH stroke scale of 2   Past Medical History:  Diagnosis Date  . Acute upper GI bleed 06/2014   while pt taking coumadin, plavix, and meloxicam---despite being told not to take coumadin.  . Anginal pain ([place])    Nonobstructive CAD 2014; however, her cardiologist put her on a statin for this and NOT for hyperlipidemia per pt report.  Atyp CP 08/2017 at card f/u, plan for myoc perf imaging.  . Anxiety    panic attacks  . Asthma    w/ asbestososis   . BPPV (benign paroxysmal positional vertigo) 12/16/2012  . Chronic diastolic CHF  (congestive heart failure) (HCC)    dry wt as of 11/06/16 is 168 lbs.  . Chronic lower back pain   . COPD (chronic obstructive pulmonary disease) ([place])   . DDD (degenerative disc disease)    lumbar and cervical.   . Diverticular disease   . Fibromyalgia    Patient states dx was around her late 41s but she had sx's for years prior to this.  . H/O hiatal hernia   . Hay fever   . History of pneumonia    hospitalized 12/2011, 02/2013, and 07/2013 [facility]) for this  . HTN (hypertension)    Renal artery dopplers 04/2013 neg for stenosis.  . Hypervitaminosis D 09/27/2019   over-supplemented.  Stopped vit D and plan recheck 2 mo.  . Idiopathic angio-edema-urticaria 72014   Angioedema component was very minimal  . Insomnia   . Iron deficiency anemia    Hematologist in [place], [place] did extensive w/u; no cause found; failed oral supplement;; gets fairly regular (q50m or so) IV iron infusions (Venofer -iron sucrose- 200mg  with procrit.  "for 14 yr I've been getting blood work q month & getting infusions prn" (07/12/2013).  Dr. Marin Olp locally, iron infusions done, EPO deficiency dx'd  . Migraine syndrome    "not as often anymore; used to be ~ q wk" (07/12/2013)  . Mixed incontinence urge and stress   . Nephrolithiasis    "passed all on my own or they are still in there" (07/12/2013)  . Neuroleptic induced Parkinsonism ([place])  2018   Dr. Carles Collet, neuro, saw her 11/24/17 and recommended d/c of abilify as first step.  D/c'd abilify and pt got complete recovery.  . OSA on CPAP    prior to move to [place]--had another sleep study 10/2015 w/pulm Dr. Camillo Flaming.  . Osteoarthritis    "severe; progressing fast" (07/12/2013); multiple joints-not surgical candidate for TKR (03/2015).  Triamcinolon knee injections by Dr. Tessa Lerner 12/2017.  [name] Kitchen Pernicious anemia 08/24/2014  . Pleural plaque with presence of asbestos 07/22/2013  . Pulmonary embolism ([place]) 07/2013   Dx at [facility] with very small peripheral upper lobe pe 07/2013: pt took coumadin  for about 8-9 mo  . Pyelonephritis    "several times over the last 30 yr" (07/12/2013)  . RBBB (right bundle branch block)   . Recurrent major depression ([place])   . Recurrent UTI    hx of hospitalization for pyelonephritis; started abx prophylaxis 06/2015  . Syncope    Hypotensive; ED visit--Dr. Terrence Dupont did Cath--nonobstructive CAD, EF 55-60%.  In retrospect, suspect pt rx med misuse/polypharmacy  . Tension headache, chronic      Family History  Problem Relation Age of Onset  . Arthritis Mother   . Kidney disease Mother   . Heart disease Father   . Stroke Father   . Hypertension Father   . Diabetes Father   . Heart attack Father   . Heart attack Paternal Grandmother   . Diabetes Sister        one sister  . Hypertension Sister   . Hypertension Brother   . Multiple sclerosis Son    Social History:   reports that she has never smoked. She has never used smokeless tobacco. She reports that she does not drink alcohol or use drugs.  Medications  Current Facility-Administered Medications:  .  sodium chloride flush (NS) 0.9 % injection 3 mL, 3 mL, Intravenous, Once, Nanavati, Ankit, MD  Current Outpatient Medications:  .  albuterol (VENTOLIN HFA) 108 (90 Base) MCG/ACT inhaler, Inhale 1-2 puffs into the lungs every 6 (six) hours as needed for wheezing or shortness of breath., Disp: 18 g, Rfl: 1 .  alendronate (FOSAMAX) 70 MG tablet, Take 1 tablet (70 mg total) by mouth every 7 (seven) days. Take with a full glass of water on an empty stomach, first thing in the morning and remain up right for 30 minutes after taking., Disp: 12 tablet, Rfl: 3 .  ALPRAZolam (XANAX) 1 MG tablet, TAKE 1 TABLET THREE TIMES DAILY AS NEEDED FOR ANXIETY., Disp: 90 tablet, Rfl: 5 .  aspirin 81 MG tablet, Take 81 mg by mouth at bedtime., Disp: , Rfl:  .  ciprofloxacin (CIPRO) 250 MG tablet, Take 1 tablet (250 mg total) by mouth 2 (two) times daily., Disp: 30 tablet, Rfl: 3 .  diclofenac sodium (VOLTAREN) 1 %  GEL, Apply 2 g topically 4 (four) times daily., Disp: 350 g, Rfl: 4 .  donepezil (ARICEPT) 10 MG tablet, Take 1 tablet (10 mg total) by mouth at bedtime., Disp: 30 tablet, Rfl: 6 .  DULoxetine (CYMBALTA) 30 MG capsule, TAKE 1 CAPSULE ONCE DAILY ALONG WITH 60MG  CAPSULE FOR TOTAL 90MG  DAILY., Disp: 90 capsule, Rfl: 0 .  DULoxetine (CYMBALTA) 60 MG capsule, TAKE 1 CAPSULE A DAY TO BE COMBINED WITH 30MG  CAPSULE (Patient taking differently: Take 60 mg by mouth daily. Take with 30mg  to total 90mg  daily.), Disp: 90 capsule, Rfl: 3 .  fluconazole (DIFLUCAN) 100 MG tablet, 1 tab po qd prn thrush (Patient taking differently: Take  100 mg by mouth daily as needed (Thrush). ), Disp: 60 tablet, Rfl: 1 .  fluticasone (FLONASE) 50 MCG/ACT nasal spray, Place 2 sprays into both nostrils daily., Disp: 16 g, Rfl: 11 .  furosemide (LASIX) 40 MG tablet, Take 1 tablet (40 mg total) by mouth daily as needed for fluid., Disp: , Rfl:  .  ipratropium (ATROVENT) 0.03 % nasal spray, Place 2 sprays into both nostrils every 12 (twelve) hours., Disp: 30 mL, Rfl: 11 .  isosorbide mononitrate (IMDUR) 30 MG 24 hr tablet, TAKE 2 TABLETS IN THE AM AND 1 TABLET IN THE PM., Disp: 90 tablet, Rfl: 3 .  lamoTRIgine (LAMICTAL) 150 MG tablet, TAKE 1 TABLET EACH DAY., Disp: 90 tablet, Rfl: 1 .  lubiprostone (AMITIZA) 24 MCG capsule, Take 1 capsule (24 mcg total) by mouth 2 (two) times daily with a meal., Disp: 60 capsule, Rfl: 1 .  metoprolol succinate (TOPROL-XL) 50 MG 24 hr tablet, TAKE (1) TABLET DAILY IN THE MORNING. (Patient taking differently: Take 50 mg by mouth daily. ), Disp: 90 tablet, Rfl: 3 .  metroNIDAZOLE (METROGEL) 0.75 % gel, Apply 1 application topically 2 (two) times daily., Disp: 45 g, Rfl: 3 .  Multiple Vitamin (MULTIVITAMIN WITH MINERALS) TABS tablet, Take 1 tablet by mouth daily., Disp: , Rfl:  .  nitroGLYCERIN (NITROSTAT) 0.4 MG SL tablet, Place 1 tablet (0.4 mg total) under the tongue every 5 (five) minutes as needed for  chest pain (x 3 doses). (Patient not taking: Reported on 11/29/2019), Disp: 25 tablet, Rfl: 11 .  ondansetron (ZOFRAN) 8 MG tablet, Take 1 tablet (8 mg total) by mouth every 8 (eight) hours as needed for nausea or vomiting., Disp: 30 tablet, Rfl: 6 .  oxyCODONE-acetaminophen (PERCOCET) 10-325 MG tablet, TAKE (1) TABLET EVERY SIX HOURS AS NEEDED FOR PAIN., Disp: 100 tablet, Rfl: 0 .  pantoprazole (PROTONIX) 40 MG tablet, TAKE 1 TABLET BY MOUTH DAILY., Disp: 90 tablet, Rfl: 3 .  rosuvastatin (CRESTOR) 20 MG tablet, Take 1 tablet (20 mg total) by mouth daily., Disp: 90 tablet, Rfl: 3 .  solifenacin (VESICARE) 5 MG tablet, TAKE 1 TABLET ONCE DAILY., Disp: 30 tablet, Rfl: 1 .  traZODone (DESYREL) 50 MG tablet, TAKE 2-4 TABLETS AT BEDTIME, Disp: 120 tablet, Rfl: 5 .  Vitamin D, Ergocalciferol, (DRISDOL) 1.25 MG (50000 UT) CAPS capsule, TAKE (1) CAPSULE TWICE A WEEK. (Patient taking differently: Take 50,000 Units by mouth 2 (two) times a week. ), Disp: 24 capsule, Rfl: 0  Facility-Administered Medications Ordered in Other Encounters:  .  ferumoxytol (FERAHEME) 510 mg in sodium chloride 0.9 % 100 mL IVPB, 510 mg, Intravenous, Once, Ennever, Rudell Cobb, MD  ROS:    General ROS: negative for - chills, fatigue, fever, night sweats, weight gain or weight loss Psychological ROS: negative for - behavioral disorder, hallucinations, memory difficulties, mood swings or suicidal ideation Ophthalmic ROS: negative for - blurry vision, double vision, eye pain or loss of vision ENT ROS: Positive for -patient has swollen tongue and swallowing lips Allergy and Immunology ROS: Positive for -arms are itching Hematological and Lymphatic ROS: negative for - bleeding problems, bruising or swollen lymph nodes Endocrine ROS: negative for - galactorrhea, hair pattern changes, polydipsia/polyuria or temperature intolerance Respiratory ROS: negative for - cough, hemoptysis, shortness of breath or wheezing Cardiovascular ROS:  negative for - chest pain, dyspnea on exertion, edema or irregular heartbeat Gastrointestinal ROS: negative for - abdominal pain, diarrhea, hematemesis, nausea/vomiting or stool incontinence Genito-Urinary ROS: negative for - dysuria, hematuria,  incontinence or urinary frequency/urgency Musculoskeletal ROS: negative for - joint swelling or muscular weakness Neurological ROS: as noted in HPI Dermatological ROS: negative for rash and skin lesion changes  Exam: Current vital signs: BP (!) 155/94 (BP Location: Left Arm)   Pulse 70   Temp 98.4 F (36.9 C) (Oral)   Resp 14   Ht 5\' 4"  (1.626 m)   Wt 69.9 kg   SpO2 97%   BMI 26.43 kg/m  Vital signs in last 24 hours: Temp:  [98.4 F (36.9 C)] 98.4 F (36.9 C) (01/28 1743) Pulse Rate:  [70] 70 (01/28 1743) Resp:  [14] 14 (01/28 1743) BP: (155)/(94) 155/94 (01/28 1743) SpO2:  [97 %-99 %] 97 % (01/28 1743) Weight:  [69.9 kg] 69.9 kg (01/28 1743)   Constitutional: Appears well-developed and well-nourished.  Psych: Affect appropriate to situation Eyes: No scleral injection HENT: Swollen lips and swollen tongue Head: Normocephalic.  Cardiovascular: Normal rate and regular rhythm.  Respiratory: Effort normal, non-labored breathing GI: Soft.  No distension. There is no tenderness.  Skin: WDI  Neuro: Mental Status: Patient is awake, alert, oriented to person, place, month, year, and situation. Speech- naming, repeating, comprehension Dysarthria secondary to [place] and also swollen tongue and lips Cranial Nerves: II: Visual Fields are full.  III,IV, VI: EOMI without ptosis or diploplia. Pupils equal, round and reactive to light V: Facial sensation is symmetric to temperature VII: Facial movement is symmetric.  VIII: hearing is intact to voice X: Palat elevates symmetrically XI: Shoulder shrug is symmetric. XII: tongue is midline without atrophy or fasciculations.  Motor: Tone is normal. Bulk is normal. 5/5 strength was  present in all four extremities.  Sensory: Intact throughout with the exception of decreased sensation on her left leg from ankle to foot Deep Tendon Reflexes: 2+ and symmetric in the biceps and patellae.  Plantars: Toes are downgoing bilaterally. Cerebellar: FNF and HKS are intact bilaterally  Labs I have reviewed labs in epic and the results pertinent to this consultation are:   CBC    Component Value Date/Time   WBC 8.5 01/20/2020 1712   RBC 4.89 01/20/2020 1712   HGB 14.3 01/20/2020 1720   HGB 13.6 06/05/2015 1315   HCT 42.0 01/20/2020 1720   HCT 34.4 01/02/2016 0610   HCT 40.8 06/05/2015 1315   PLT 250 01/20/2020 1712   PLT 242 06/05/2015 1315   MCV 91.4 01/20/2020 1712   MCV 92 06/05/2015 1315   MCH 28.8 01/20/2020 1712   MCHC 31.5 01/20/2020 1712   RDW 12.5 01/20/2020 1712   RDW 13.5 06/05/2015 1315   LYMPHSABS 2.1 01/20/2020 1712   LYMPHSABS 2.4 06/05/2015 1315   MONOABS 0.5 01/20/2020 1712   EOSABS 0.1 01/20/2020 1712   EOSABS 0.0 06/05/2015 1315   BASOSABS 0.0 01/20/2020 1712   BASOSABS 0.0 06/05/2015 1315    CMP     Component Value Date/Time   NA 137 01/20/2020 1720   NA 141 06/05/2015 1315   K 3.7 01/20/2020 1720   K 3.9 06/05/2015 1315   CL 105 01/20/2020 1720   CL 103 06/05/2015 1315   CO2 27 12/06/2019 1625   CO2 28 06/05/2015 1315   GLUCOSE 90 01/20/2020 1720   GLUCOSE 108 06/05/2015 1315   BUN 14 01/20/2020 1720   BUN 14 06/05/2015 1315   CREATININE 0.80 01/20/2020 1720   CREATININE 0.87 02/03/2017 1523   CALCIUM 8.7 (L) 12/06/2019 1625   CALCIUM 9.0 06/05/2015 1315   PROT 6.5 12/06/2019 1625  PROT 7.5 06/05/2015 1315   ALBUMIN 3.5 12/06/2019 1625   ALBUMIN 3.8 06/05/2015 1315   AST 21 12/06/2019 1625   AST 23 06/05/2015 1315   ALT 15 12/06/2019 1625   ALT 17 06/05/2015 1315   ALKPHOS 78 12/06/2019 1625   ALKPHOS 84 06/05/2015 1315   BILITOT 0.3 12/06/2019 1625   BILITOT 1.00 06/05/2015 1315   GFRNONAA >60 12/06/2019 1625    GFRNONAA 67 08/28/2016 1413   GFRAA >60 12/06/2019 1625   GFRAA 77 08/28/2016 1413    Lipid Panel     Component Value Date/Time   CHOL 172 09/22/2018 1037   TRIG 184.0 (H) 09/22/2018 1037   HDL 43.70 09/22/2018 1037   CHOLHDL 4 09/22/2018 1037   VLDL 36.8 09/22/2018 1037   LDLCALC 92 09/22/2018 1037     Imaging I have reviewed the images obtained:  CT-scan of the brain-stable and negative for age noncontrast CT appearance of the brain   Etta Quill PA-C [facility] [phone]  M-F  (9:00 am- 5:00 PM)  01/20/2020, 5:47 PM   Attending addendum Patient seen and examined as an acute code stroke for slurred speech. On examination, she has swelling around her lips and tongue. No other focal deficits.   Assessment:  This is a pleasant 74 year old female presenting to [facility] as a code stroke secondary to slurred speech.  On the bridge patient was noted to have swollen lips and also edema of the tongue.  Has noted above patient states that she has not changed any perfumes, cosmetics or changed any detergents.  Patient had dysarthria most likely secondary to edema of tongue and lips.  Patient was administered EpiPen however due to the fact that the slurred speech and edema did not quickly subside patient was brought to [place].  At this point most likely this is a allergic reaction however, if patient does not improve will bring to MRI to further evaluate for possible cerebellar stroke.  Not a candidate for TPA due to low stroke scale low suspicion for stroke. No cortical signs to suggest large vessel occlusion.  Impression: -Slurred speech with tongue and lip edema-less likely to be a stroke.  Recommendations: -Agree with Benadryl, Solu-Medrol and continued evaluation of airway -If does not improve consider MRI brain evaluate for stroke. -Patient is also on multiple sedating medications.  Would recommend decreasing sedating medications as much as  possible.  -- Amie Portland, MD [facility] Pager: [phone] If 7pm to 7am, please call on call as listed on AMION.

## 2020-01-20 NOTE — Progress Notes (Signed)
At 2335, patient was received to room 3W38 from ER via stretcher. Assisted to bed and positioned for comfort. Oriented to room, bed and unit. No signs of distress noted.

## 2020-01-20 NOTE — ED Provider Notes (Signed)
[facility] EMERGENCY DEPARTMENT Provider Note   CSN: [id number] Arrival date & time: 01/20/20  1709  An emergency department physician performed an initial assessment on this suspected stroke patient at 34.  History Chief Complaint  Patient presents with  . Code Stroke  . Facial Swelling    Linda [name] is a 74 y.o. female.  HPI    74 year old female comes in a chief complaint of facial swelling and code stroke.  Patient has history of COPD on chronic oxygen, hypertension, angina, acute GI bleed and idiopathic angioedema (per records).  Per EMS, patient resides in an independent living facility and they were supposed to have an event, and when they went to check on the patient and they noted that patient had slurring of her speech and facial droop.  Patient also had some swelling to her lip per EMS.  Patient reports to [place] that she feels like her tongue is swollen and she is having mild difficulty swallowing.  She has no difficulty in breathing.   Patient has history of penicillin allergy.  She denies any history of swelling to her lips.  She denies any new exposures or new medications.  Past Medical History:  Diagnosis Date  . Acute upper GI bleed 06/2014   while pt taking coumadin, plavix, and meloxicam---despite being told not to take coumadin.  . Anginal pain ([place])    Nonobstructive CAD 2014; however, her cardiologist put her on a statin for this and NOT for hyperlipidemia per pt report.  Atyp CP 08/2017 at card f/u, plan for myoc perf imaging.  . Anxiety    panic attacks  . Asthma    w/ asbestososis   . BPPV (benign paroxysmal positional vertigo) 12/16/2012  . Chronic diastolic CHF (congestive heart failure) (HCC)    dry wt as of 11/06/16 is 168 lbs.  . Chronic lower back pain   . COPD (chronic obstructive pulmonary disease) ([place])   . DDD (degenerative disc disease)    lumbar and cervical.   . Diverticular disease   . Fibromyalgia    Patient  states dx was around her late 69s but she had sx's for years prior to this.  . H/O hiatal hernia   . Hay fever   . History of pneumonia    hospitalized 12/2011, 02/2013, and 07/2013 [facility]) for this  . HTN (hypertension)    Renal artery dopplers 04/2013 neg for stenosis.  . Hypervitaminosis D 09/27/2019   over-supplemented.  Stopped vit D and plan recheck 2 mo.  . Idiopathic angio-edema-urticaria 72014   Angioedema component was very minimal  . Insomnia   . Iron deficiency anemia    Hematologist in [place], [place] did extensive w/u; no cause found; failed oral supplement;; gets fairly regular (q21m or so) IV iron infusions (Venofer -iron sucrose- 200mg  with procrit.  "for 14 yr I've been getting blood work q month & getting infusions prn" (07/12/2013).  Dr. Marin Olp locally, iron infusions done, EPO deficiency dx'd  . Migraine syndrome    "not as often anymore; used to be ~ q wk" (07/12/2013)  . Mixed incontinence urge and stress   . Nephrolithiasis    "passed all on my own or they are still in there" (07/12/2013)  . Neuroleptic induced Parkinsonism ([place]) 2018   Dr. Carles Collet, neuro, saw her 11/24/17 and recommended d/c of abilify as first step.  D/c'd abilify and pt got complete recovery.  . OSA on CPAP    prior to move to [place]--had  another sleep study 10/2015 w/pulm Dr. Camillo Flaming.  . Osteoarthritis    "severe; progressing fast" (07/12/2013); multiple joints-not surgical candidate for TKR (03/2015).  Triamcinolon knee injections by Dr. Tessa Lerner 12/2017.  [name] Kitchen Pernicious anemia 08/24/2014  . Pleural plaque with presence of asbestos 07/22/2013  . Pulmonary embolism ([place]) 07/2013   Dx at [facility] with very small peripheral upper lobe pe 07/2013: pt took coumadin for about 8-9 mo  . Pyelonephritis    "several times over the last 30 yr" (07/12/2013)  . RBBB (right bundle branch block)   . Recurrent major depression ([place])   . Recurrent UTI    hx of hospitalization for pyelonephritis; started abx prophylaxis 06/2015  . Syncope      Hypotensive; ED visit--Dr. Terrence Dupont did Cath--nonobstructive CAD, EF 55-60%.  In retrospect, suspect pt rx med misuse/polypharmacy  . Tension headache, chronic     Patient Active Problem List   Diagnosis Date Noted  . Slurred speech 01/20/2020  . Hypotension 10/04/2019  . COPD (chronic obstructive pulmonary disease) ([place]) 01/21/2018  . Pulmonary asbestosis ([place]) 01/21/2018  . Influenza A 01/02/2018  . Hypomagnesemia 01/02/2018  . Failure to thrive in adult 01/01/2018  . Altered mental status 10/21/2017  . Lumbar radiculitis 02/10/2017  . Dehydration 12/04/2016  . Low serum erythropoietin level 10/17/2016  . RBBB 09/23/2016  . Primary osteoarthritis of left knee 06/19/2016  . Debilitated patient 06/06/2016  . CHF (congestive heart failure) ([place]) 05/30/2016  . SOB (shortness of breath)   . Chronic respiratory failure with hypoxia ([place]) 02/07/2016  . Restrictive lung disease 02/07/2016  . Rotator cuff syndrome of right shoulder 01/10/2016  . UTI (urinary tract infection) 01/01/2016  . Encephalopathy, metabolic 99991111  . CAP (community acquired pneumonia) 01/01/2016  . Fall at home 01/01/2016  . Rhabdomyolysis 01/01/2016  . Diastolic heart failure ([place]) 10/16/2015  . CAD-minor 2014 08/16/2015  . Chest pain with moderate risk for cardiac etiology 08/16/2015  . Narrowing of intervertebral disc space 07/17/2015  . Bilateral lower leg pain 01/24/2015  . 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 Chronic pain syndrome 11/21/2014  . Anxiety and depression 11/21/2014  . Abnormal grief reaction 11/21/2014  . Severe persistent asthma 11/21/2014  . Hypokalemia 11/21/2014  . Hyperlipidemia 11/21/2014  . Chronic pain disorder 11/21/2014  . Primary osteoarthritis of right knee 10/18/2014  . Pernicious anemia 08/24/2014  . Generalized anxiety disorder--with occasional panic attacks.  08/05/2014  . Recurrent major depression-severe ([place]) 08/05/2014  . Multifactorial gait disorder  07/26/2014  . Epistaxis 07/18/2014  . Acute GI bleeding 07/17/2014  . Anemia associated with acute blood loss 07/17/2014  . Syncope 07/17/2014  . History of pulmonary embolism 07/17/2014  . GI bleed 07/17/2014  . Arthritis 05/11/2014  . DDD (degenerative disc disease) 05/11/2014  . Fibrositis 05/11/2014  . Amianthosis ([place]) 05/11/2014  . [name] ([place]) 05/11/2014  . Grief 04/28/2014  . OSA (obstructive sleep apnea) 04/24/2014  . Chronic diastolic CHF, NYHA class 1 04/24/2014  . Acute respiratory failure ([place]) 08/20/2013  . Atelectasis 08/06/2013  . Acute pulmonary embolism ([place]) 08/04/2013  . Pulmonary embolism ([place]) 08/04/2013  . Pleural plaque with presence of asbestos 07/22/2013  . Polypharmacy 04/26/2013  . Fibromyalgia syndrome 03/01/2013  . Insomnia 11/12/2012  . HTN (hypertension), benign 10/25/2012    Past Surgical History:  Procedure Laterality Date  . APPENDECTOMY  1960  . AXILLARY SURGERY Left 1978   Multiple "lump" in armpit per pt  . CARDIAC CATHETERIZATION  01/2013   nonobstructive CAD,  EF 55-60%  . CARDIOVASCULAR STRESS TEST  02/22/15   Low risk myocard perf imaging; wall motion normal, normal EF  . carotid duplex doppler  10/21/2017   R vertebral flow suggestive of possible distal obstruction.  Pt declined further w/u as of 10/29/17 but need to revisit this problem periodically.  . COCCYX REMOVAL  1972  . DEXA  06/05/2017   T-score -3.1  . DILATION AND CURETTAGE OF UTERUS  ? 1970's  . ESOPHAGOGASTRODUODENOSCOPY N/A 07/19/2014   Gastritis found + in the setting of supratherapeutic INR, +plavix, + meloxicam.  . EYE SURGERY Left 2012-2013   "injections for ~ 1 yr; don't really know what for" (07/12/2013)  . HEEL SPUR SURGERY Left 2008  . KNEE SURGERY  2005  . LEFT HEART CATHETERIZATION WITH CORONARY ANGIOGRAM N/A 01/30/2013   Procedure: LEFT HEART CATHETERIZATION WITH CORONARY ANGIOGRAM;  Surgeon: Clent Demark, MD;  Location: [facility] CATH LAB;  Service:  Cardiovascular;  Laterality: N/A;  . PLANTAR FASCIA RELEASE Left 2008  . SPIROMETRY  04/25/14   In hosp for acute asthma/COPD flare: mixed obstructive and restrictive lung disease. The FEV1 is severely reduced at 45% predicted.  FEV1 signif decreased compared to prior spirometry 07/23/13.  . TENDON RELEASE  1996   Right forearm and hand  . TOTAL ABDOMINAL HYSTERECTOMY  1974  . TRANSTHORACIC ECHOCARDIOGRAM  01/2013; 04/2014;08/2015; 09/2017   2014--NORMAL.  2015--focal basal septal hypertrophy, EF 55-60%, grade I diast dysfxn, mild LAE.  08/2015 EF 55-60%, nl LV syst fxn, grade I DD, valves wnl. 10/21/17: EF 65-70%, grd I DD, o/w normal.     OB History   No obstetric history on file.     Family History  Problem Relation Age of Onset  . Arthritis Mother   . Kidney disease Mother   . Heart disease Father   . Stroke Father   . Hypertension Father   . Diabetes Father   . Heart attack Father   . Heart attack Paternal Grandmother   . Diabetes Sister        one sister  . Hypertension Sister   . Hypertension Brother   . Multiple sclerosis Son     Social History   Tobacco Use  . Smoking status: Never Smoker  . Smokeless tobacco: Never Used  . Tobacco comment: never used tobacco  Substance Use Topics  . Alcohol use: No    Alcohol/week: 0.0 standard drinks  . Drug use: No    Home Medications Prior to Admission medications   Medication Sig Start Date End Date Taking? Authorizing Provider  albuterol (VENTOLIN HFA) 108 (90 Base) MCG/ACT inhaler Inhale 1-2 puffs into the lungs every 6 (six) hours as needed for wheezing or shortness of breath. 12/06/19   McGowen, Adrian Blackwater, MD  alendronate (FOSAMAX) 70 MG tablet Take 1 tablet (70 mg total) by mouth every 7 (seven) days. Take with a full glass of water on an empty stomach, first thing in the morning and remain up right for 30 minutes after taking. 07/26/19   McGowen, Adrian Blackwater, MD  ALPRAZolam (XANAX) 1 MG tablet TAKE 1 TABLET THREE TIMES DAILY AS  NEEDED FOR ANXIETY. Patient taking differently: Take 1 mg by mouth 3 (three) times daily as needed for anxiety.  10/11/19   McGowen, Adrian Blackwater, MD  aspirin 81 MG tablet Take 81 mg by mouth at bedtime.    [provider]  ciprofloxacin (CIPRO) 250 MG tablet Take 1 tablet (250 mg total) by mouth 2 (two)  times daily. 10/22/19   McGowen, Adrian Blackwater, MD  diclofenac sodium (VOLTAREN) 1 % GEL Apply 2 g topically 4 (four) times daily. 10/20/19   Meredith Staggers, MD  donepezil (ARICEPT) 10 MG tablet Take 1 tablet (10 mg total) by mouth at bedtime. 09/28/19   McGowen, Adrian Blackwater, MD  DULoxetine (CYMBALTA) 30 MG capsule TAKE 1 CAPSULE ONCE DAILY ALONG WITH 60MG  CAPSULE FOR TOTAL 90MG  DAILY. 01/10/20   McGowen, Adrian Blackwater, MD  DULoxetine (CYMBALTA) 60 MG capsule TAKE 1 CAPSULE A DAY TO BE COMBINED WITH 30MG  CAPSULE Patient taking differently: Take 60 mg by mouth daily. Take with 30mg  to total 90mg  daily. 07/27/19   McGowen, Adrian Blackwater, MD  fluconazole (DIFLUCAN) 100 MG tablet 1 tab po qd prn thrush Patient taking differently: Take 100 mg by mouth daily as needed (Thrush).  07/26/19   McGowen, Adrian Blackwater, MD  fluticasone (FLONASE) 50 MCG/ACT nasal spray Place 2 sprays into both nostrils daily. 09/27/19   McGowen, Adrian Blackwater, MD  furosemide (LASIX) 40 MG tablet Take 1 tablet (40 mg total) by mouth daily as needed for fluid. 10/07/19   Cherylann Ratel A, DO  ipratropium (ATROVENT) 0.03 % nasal spray Place 2 sprays into both nostrils every 12 (twelve) hours. 09/27/19   McGowen, Adrian Blackwater, MD  isosorbide mononitrate (IMDUR) 30 MG 24 hr tablet TAKE 2 TABLETS IN THE AM AND 1 TABLET IN THE PM. 12/31/19   Lelon Perla, MD  lamoTRIgine (LAMICTAL) 150 MG tablet TAKE 1 TABLET EACH DAY. 11/22/19   McGowen, Adrian Blackwater, MD  lubiprostone (AMITIZA) 24 MCG capsule Take 1 capsule (24 mcg total) by mouth 2 (two) times daily with a meal. 10/22/19   McGowen, Adrian Blackwater, MD  metoprolol succinate (TOPROL-XL) 50 MG 24 hr tablet TAKE (1) TABLET DAILY  IN THE MORNING. Patient taking differently: Take 50 mg by mouth daily.  07/27/19   McGowen, Adrian Blackwater, MD  metroNIDAZOLE (METROGEL) 0.75 % gel Apply 1 application topically 2 (two) times daily. 11/29/19   McGowen, Adrian Blackwater, MD  Multiple Vitamin (MULTIVITAMIN WITH MINERALS) TABS tablet Take 1 tablet by mouth daily.    [provider]  nitroGLYCERIN (NITROSTAT) 0.4 MG SL tablet Place 1 tablet (0.4 mg total) under the tongue every 5 (five) minutes as needed for chest pain (x 3 doses). Patient not taking: Reported on 11/29/2019 09/23/19   Lelon Perla, MD  ondansetron (ZOFRAN) 8 MG tablet Take 1 tablet (8 mg total) by mouth every 8 (eight) hours as needed for nausea or vomiting. 10/22/19   McGowen, Adrian Blackwater, MD  oxyCODONE-acetaminophen (PERCOCET) 10-325 MG tablet TAKE (1) TABLET EVERY SIX HOURS AS NEEDED FOR PAIN. 12/01/19   Meredith Staggers, MD  pantoprazole (PROTONIX) 40 MG tablet TAKE 1 TABLET BY MOUTH DAILY. 12/10/19   McGowen, Adrian Blackwater, MD  rosuvastatin (CRESTOR) 20 MG tablet Take 1 tablet (20 mg total) by mouth daily. 09/23/19 12/22/19  Lelon Perla, MD  solifenacin (VESICARE) 5 MG tablet TAKE 1 TABLET ONCE DAILY. 12/14/19   Tammi Sou, MD  traZODone (DESYREL) 50 MG tablet TAKE 2-4 TABLETS AT BEDTIME 12/10/19   McGowen, Adrian Blackwater, MD  Vitamin D, Ergocalciferol, (DRISDOL) 1.25 MG (50000 UT) CAPS capsule TAKE (1) CAPSULE TWICE A WEEK. Patient taking differently: Take 50,000 Units by mouth 2 (two) times a week.  07/27/19   McGowen, Adrian Blackwater, MD    Allergies    Abilify [aripiprazole] and Penicillins  Review of Systems   Review  of Systems  Constitutional: Positive for activity change.  Respiratory: Negative for shortness of breath.   Cardiovascular: Negative for chest pain.  Gastrointestinal: Negative for nausea and vomiting.  Allergic/Immunologic: Negative for immunocompromised state.  Neurological: Positive for weakness. Negative for dizziness, syncope, speech difficulty,  light-headedness, numbness and headaches.  Hematological: Does not bruise/bleed easily.  All other systems reviewed and are negative.   Physical Exam Updated Vital Signs BP 124/69   Pulse 75   Temp 98.4 F (36.9 C) (Oral)   Resp 18   Ht 5\' 4"  (1.626 m)   Wt 69.9 kg   SpO2 93%   BMI 26.43 kg/m   Physical Exam Vitals and nursing note reviewed.  Constitutional:      Appearance: She is well-developed.  HENT:     Head: Normocephalic and atraumatic.     Mouth/Throat:     Comments: Patient has swelling to her lower lip.  Tongue appears mildly edematous, mainly at the base. Cardiovascular:     Rate and Rhythm: Normal rate.  Pulmonary:     Effort: Pulmonary effort is normal.  Abdominal:     General: Bowel sounds are normal.  Musculoskeletal:     Cervical back: Normal range of motion and neck supple.  Skin:    General: Skin is warm and dry.  Neurological:     Mental Status: She is alert and oriented to person, place, and time.     Sensory: No sensory deficit.     Motor: No weakness.     Comments: Patient has slurred speech. No signs of any facial droop.     ED Results / Procedures / Treatments   Labs (all labs ordered are listed, but only abnormal results are displayed) Labs Reviewed  COMPREHENSIVE METABOLIC PANEL - Abnormal; Notable for the following components:      Result Value   CO2 20 (*)    GFR calc non Af Amer 59 (*)    All other components within normal limits  I-STAT CHEM 8, ED - Abnormal; Notable for the following components:   Calcium, Ion 1.06 (*)    All other components within normal limits  SARS CORONAVIRUS 2 (TAT 6-24 HRS)  PROTIME-INR  APTT  CBC  DIFFERENTIAL  CBG MONITORING, ED    EKG EKG Interpretation  Date/Time:  Thursday January 20 2020 17:45:48 EST Ventricular Rate:  71 PR Interval:    QRS Duration: 161 QT Interval:  464 QTC Calculation: 505 R Axis:   16 Text Interpretation: Atrial fibrillation Right bundle branch block No acute  changes No significant change since last tracing Confirmed by Varney Biles [phone]) on 01/20/2020 7:22:49 PM   Radiology CT HEAD CODE STROKE WO CONTRAST  Result Date: 01/20/2020 CLINICAL DATA:  Code stroke.  74 year old female EXAM: CT HEAD WITHOUT CONTRAST TECHNIQUE: Contiguous axial images were obtained from the base of the skull through the vertex without intravenous contrast. COMPARISON:  Head CT 12/06/2019. Brain MRI 08/24/2014. FINDINGS: Brain: Cerebral volume is stable. No acute intracranial hemorrhage identified. No midline shift, mass effect, or evidence of intracranial mass lesion. Stable gray-white matter differentiation throughout the brain. No cortically based acute infarct identified. No cortical encephalomalacia identified. Vascular: Calcified atherosclerosis at the skull base. No suspicious intracranial vascular hyperdensity. Skull: No acute osseous abnormality identified. Sinuses/Orbits: Visualized paranasal sinuses and mastoids are stable and well pneumatized. Benign appearing bone lesion left maxillary sinus alveolar recess on series 3, image 4. Other: No acute orbit or scalp soft tissue findings. ASPECTS [facility] Stroke Program  Early CT Score) Total score (0-10 with 10 being normal): 10 IMPRESSION: 1. Stable and negative for age noncontrast CT appearance of the brain. 2. ASPECTS is 10. 3. These results were communicated to Dr. Rory Percy at 5:41 pm on 01/20/2020 by text page via the [facility] messaging system. Electronically Signed   By: Genevie Ann M.D.   On: 01/20/2020 17:41    Procedures .Critical Care Performed by: Varney Biles, MD Authorized by: Varney Biles, MD   Critical care provider statement:    Critical care time (minutes):  36   Critical care was time spent personally by me on the following activities:  Discussions with consultants, evaluation of patient's response to treatment, examination of patient, ordering and performing treatments and interventions, ordering and review  of laboratory studies, ordering and review of radiographic studies, pulse oximetry, re-evaluation of patient's condition, obtaining history from patient or surrogate and review of old charts   (including critical care time)  Medications Ordered in ED Medications  sodium chloride flush (NS) 0.9 % injection 3 mL (has no administration in time range)  methylPREDNISolone sodium succinate (SOLU-MEDROL) 125 mg/2 mL injection 125 mg (125 mg Intravenous Given 01/20/20 1741)  diphenhydrAMINE (BENADRYL) injection 25 mg (25 mg Intravenous Given 01/20/20 1745)  EPINEPHrine (EPI-PEN) injection 0.3 mg (0.3 mg Intramuscular Given 01/20/20 1750)  EPINEPHrine (EPI-PEN) injection 0.3 mg (0.3 mg Intramuscular Given 01/20/20 1740)    ED Course  I have reviewed the triage vital signs and the nursing notes.  Pertinent labs & imaging results that were available during my care of the patient were reviewed by me and considered in my medical decision making (see chart for details).    MDM Rules/Calculators/A&P                       74 year old comes in a chief complaint of code stroke and facial swelling. Code stroke was activated on the field.  Neuro team is at the bedside.  The suspicion for stroke is low.  They recommend that we treat the patient for possible hypersensitivity and if patient does not get better than she should get stroke work-up (MRI).  Patient denies any known history of allergies and any new exposures.  Her symptoms were sudden onset, therefore we think hypersensitivity is possible.  She was given EpiPen IM x 2 and Benadryl IV.  Her swelling actually has improved.  However patient continues to feel cottonmouth type sensation.  She continues to have no shortness of breath and there is no stridor, drooling.  No objective signs of airway obstruction.   Reassessment: Patient reassessed about an hour after her arrival.  She feels better now.  She looks better.  Benadryl was given.  Patient is now  more sleepy.  Reassessment: Nursing staff informed me that patient informed her that she is having some difficulty swallowing.  Patient informs me that she is no worse.  She still has no stridor, drooling.  Oral exam continues to be at baseline normal.  We have the airway cart at the bedside but do not think we need to proceed with intubation or evaluation.  Reassessment: Chart review reveals that patient has idiopathic angioedema history.  She does not know anything about that condition.  I spoke with patient's daughter who also does not recall any episode of swelling in the past.  I have discussed the plan with patient's daughter and we will admit her for stroke rule out and close monitoring of her airway.  It  appears that patient is on multiple medications and she might need optimization of her polypharmacy.  Final Clinical Impression(s) / ED Diagnoses Final diagnoses:  Slurred speech    Rx / DC Orders ED Discharge Orders    None       Varney Biles, MD 01/20/20 2220

## 2020-01-20 NOTE — ED Triage Notes (Signed)
Pt brought in by EMS with complaints of slurred speech and facial droop. Pt from [facility]. Lips and tongue appear swollen at the bridge during assessment.

## 2020-01-21 ENCOUNTER — Encounter (HOSPITAL_COMMUNITY): Payer: Self-pay | Admitting: Internal Medicine

## 2020-01-21 DIAGNOSIS — R4781 Slurred speech: Secondary | ICD-10-CM | POA: Diagnosis not present

## 2020-01-21 DIAGNOSIS — G4700 Insomnia, unspecified: Secondary | ICD-10-CM | POA: Diagnosis present

## 2020-01-21 DIAGNOSIS — R29810 Facial weakness: Secondary | ICD-10-CM | POA: Diagnosis present

## 2020-01-21 DIAGNOSIS — T783XXA Angioneurotic edema, initial encounter: Secondary | ICD-10-CM | POA: Diagnosis present

## 2020-01-21 DIAGNOSIS — Z86711 Personal history of pulmonary embolism: Secondary | ICD-10-CM | POA: Diagnosis not present

## 2020-01-21 DIAGNOSIS — N3946 Mixed incontinence: Secondary | ICD-10-CM | POA: Diagnosis present

## 2020-01-21 DIAGNOSIS — E785 Hyperlipidemia, unspecified: Secondary | ICD-10-CM | POA: Diagnosis present

## 2020-01-21 DIAGNOSIS — I5032 Chronic diastolic (congestive) heart failure: Secondary | ICD-10-CM | POA: Diagnosis present

## 2020-01-21 DIAGNOSIS — R471 Dysarthria and anarthria: Secondary | ICD-10-CM | POA: Diagnosis present

## 2020-01-21 DIAGNOSIS — K449 Diaphragmatic hernia without obstruction or gangrene: Secondary | ICD-10-CM | POA: Diagnosis present

## 2020-01-21 DIAGNOSIS — F332 Major depressive disorder, recurrent severe without psychotic features: Secondary | ICD-10-CM | POA: Diagnosis present

## 2020-01-21 DIAGNOSIS — Z20822 Contact with and (suspected) exposure to covid-19: Secondary | ICD-10-CM | POA: Diagnosis present

## 2020-01-21 DIAGNOSIS — Z880 Allergy status to penicillin: Secondary | ICD-10-CM | POA: Diagnosis not present

## 2020-01-21 DIAGNOSIS — M797 Fibromyalgia: Secondary | ICD-10-CM | POA: Diagnosis present

## 2020-01-21 DIAGNOSIS — E673 Hypervitaminosis D: Secondary | ICD-10-CM | POA: Diagnosis present

## 2020-01-21 DIAGNOSIS — G4733 Obstructive sleep apnea (adult) (pediatric): Secondary | ICD-10-CM | POA: Diagnosis present

## 2020-01-21 DIAGNOSIS — M5136 Other intervertebral disc degeneration, lumbar region: Secondary | ICD-10-CM | POA: Diagnosis present

## 2020-01-21 DIAGNOSIS — J9611 Chronic respiratory failure with hypoxia: Secondary | ICD-10-CM | POA: Diagnosis present

## 2020-01-21 DIAGNOSIS — G8929 Other chronic pain: Secondary | ICD-10-CM | POA: Diagnosis present

## 2020-01-21 DIAGNOSIS — Z79899 Other long term (current) drug therapy: Secondary | ICD-10-CM | POA: Diagnosis not present

## 2020-01-21 DIAGNOSIS — I2510 Atherosclerotic heart disease of native coronary artery without angina pectoris: Secondary | ICD-10-CM | POA: Diagnosis present

## 2020-01-21 DIAGNOSIS — J449 Chronic obstructive pulmonary disease, unspecified: Secondary | ICD-10-CM | POA: Diagnosis present

## 2020-01-21 DIAGNOSIS — G2111 Neuroleptic induced parkinsonism: Secondary | ICD-10-CM | POA: Diagnosis present

## 2020-01-21 DIAGNOSIS — I110 Hypertensive heart disease with heart failure: Secondary | ICD-10-CM | POA: Diagnosis present

## 2020-01-21 DIAGNOSIS — K5790 Diverticulosis of intestine, part unspecified, without perforation or abscess without bleeding: Secondary | ICD-10-CM | POA: Diagnosis present

## 2020-01-21 DIAGNOSIS — Z9981 Dependence on supplemental oxygen: Secondary | ICD-10-CM | POA: Diagnosis not present

## 2020-01-21 LAB — MRSA PCR SCREENING: MRSA by PCR: NEGATIVE

## 2020-01-21 LAB — SEDIMENTATION RATE: Sed Rate: 15 mm/hr (ref 0–22)

## 2020-01-21 LAB — SARS CORONAVIRUS 2 (TAT 6-24 HRS): SARS Coronavirus 2: NEGATIVE

## 2020-01-21 LAB — C-REACTIVE PROTEIN: CRP: 1.1 mg/dL — ABNORMAL HIGH (ref <1.0)

## 2020-01-21 MED ORDER — DARIFENACIN HYDROBROMIDE ER 7.5 MG PO TB24
7.5000 mg | ORAL_TABLET | Freq: Every day | ORAL | Status: DC
  Administered 2020-01-21 – 2020-01-22 (×2): 7.5 mg via ORAL
  Filled 2020-01-21 (×2): qty 1

## 2020-01-21 MED ORDER — METOPROLOL SUCCINATE ER 25 MG PO TB24
25.0000 mg | ORAL_TABLET | Freq: Every day | ORAL | Status: DC
  Administered 2020-01-21 – 2020-01-22 (×2): 25 mg via ORAL
  Filled 2020-01-21 (×2): qty 1

## 2020-01-21 MED ORDER — PANTOPRAZOLE SODIUM 40 MG PO TBEC
40.0000 mg | DELAYED_RELEASE_TABLET | Freq: Every day | ORAL | Status: DC
  Administered 2020-01-21 – 2020-01-22 (×2): 40 mg via ORAL
  Filled 2020-01-21 (×2): qty 1

## 2020-01-21 MED ORDER — TRAZODONE HCL 100 MG PO TABS
200.0000 mg | ORAL_TABLET | Freq: Once | ORAL | Status: AC
  Administered 2020-01-21: 200 mg via ORAL
  Filled 2020-01-21: qty 2

## 2020-01-21 MED ORDER — ROSUVASTATIN CALCIUM 20 MG PO TABS
20.0000 mg | ORAL_TABLET | Freq: Every day | ORAL | Status: DC
  Administered 2020-01-21: 20 mg via ORAL
  Filled 2020-01-21: qty 1

## 2020-01-21 MED ORDER — OXYCODONE-ACETAMINOPHEN 5-325 MG PO TABS
2.0000 | ORAL_TABLET | Freq: Four times a day (QID) | ORAL | Status: DC | PRN
  Administered 2020-01-21 – 2020-01-22 (×4): 2 via ORAL
  Filled 2020-01-21 (×5): qty 2

## 2020-01-21 MED ORDER — ISOSORBIDE MONONITRATE ER 60 MG PO TB24
60.0000 mg | ORAL_TABLET | Freq: Every day | ORAL | Status: DC
  Administered 2020-01-21 – 2020-01-22 (×2): 60 mg via ORAL
  Filled 2020-01-21 (×2): qty 1

## 2020-01-21 MED ORDER — DOCUSATE SODIUM 100 MG PO CAPS
100.0000 mg | ORAL_CAPSULE | Freq: Two times a day (BID) | ORAL | Status: DC
  Administered 2020-01-21 – 2020-01-22 (×3): 100 mg via ORAL
  Filled 2020-01-21 (×3): qty 1

## 2020-01-21 MED ORDER — ALPRAZOLAM 0.5 MG PO TABS
1.0000 mg | ORAL_TABLET | Freq: Two times a day (BID) | ORAL | Status: DC
  Administered 2020-01-21 (×2): 1 mg via ORAL
  Filled 2020-01-21 (×2): qty 2

## 2020-01-21 MED ORDER — DULOXETINE HCL 60 MG PO CPEP
60.0000 mg | ORAL_CAPSULE | Freq: Every day | ORAL | Status: DC
  Administered 2020-01-21 – 2020-01-22 (×2): 60 mg via ORAL
  Filled 2020-01-21 (×2): qty 1

## 2020-01-21 MED ORDER — DONEPEZIL HCL 10 MG PO TABS
10.0000 mg | ORAL_TABLET | Freq: Every day | ORAL | Status: DC
  Administered 2020-01-21: 10 mg via ORAL
  Filled 2020-01-21: qty 1

## 2020-01-21 MED ORDER — PREDNISONE 50 MG PO TABS
50.0000 mg | ORAL_TABLET | Freq: Every day | ORAL | Status: DC
  Administered 2020-01-22: 50 mg via ORAL
  Filled 2020-01-21: qty 1

## 2020-01-21 NOTE — Progress Notes (Addendum)
[facility] PROGRESS NOTE  Linda [name] [id number] DOB: 07-16-1946 DOA: 01/20/2020 PCP: Tammi Sou, MD  Assessment/Plan:  Recurrent angioedema. Patient noted to have swollen lips and tongue on admission.   No dyspnea, stridor, or drooling.  No signs of airway obstruction.  Angioedema could be possibly due to an allergic component but she does have a documented history of idiopathic angioedema. No ACE inhibitor or ARB listed in home medications. CRP 1.1. C4 complement pending.  Patient received EpiPen, Benadryl, and Solu-Medrol x3 doses. Much improved this am.  - continue to monitor closely for signs of airway compromise -Continuous pulse ox, supplemental oxygen as needed -transition Solu-Medrol to po prednisnone -Claritin daily -Hold aspirin -Please ensure follow-up with an allergist.  Dysarthria. Much improved this am. Initially speech clear and fluid. Later in morning suddenly developed stutter. Significance not clear.  CT and brain MRI not suggestive of acute stroke.  Seen by neurology and her dysarthria is thought to be secondary to edema of lips and tongue.  In addition, takes multiple sedating medications at home which could also possibly be contributing. Evaluated by ST who opine no risk aspiration and likely improve as swelling continues to resolve. Recommended dysphagia 3 diet. -Continue to monitor -Avoid sedating medications  Chronic diastolic congestive heart failure. Compensated.  -Hold diuretic  COPD. Oxygen saturation level 97% on room air. No wheezing, cough, or shortness of breath. -Combivent inhaler as needed  Hypertension. BP high end of normal. Home meds include Imdur and Metoprolol -resume home meds -monitor  Anxiety/depression. Appears somewhat anxious. Home meds include anti-anxiety and anti-depressants.  -will resume home meds judiciously -monitor  Chronic pain. Home meds include oxy 10mg  every 6 hours.  -resume home meds   Code  Status: limited Family Communication: patient Disposition Plan: back to [place] when ready   Consultants:  [facility] neurology  Procedures:    Antibiotics:    HPI/Subjective: Awake alert complains of back and leg pain per her chronic issues. Denies difficulty swallowing. Speech is clear  Objective: Vitals:   01/21/20 0957 01/21/20 1202  BP:  (!) 151/89  Pulse:  88  Resp:  16  Temp:  98.1 F (36.7 C)  SpO2: 93% 97%    Intake/Output Summary (Last 24 hours) at 01/21/2020 1235 Last data filed at 01/21/2020 0600 Gross per 24 hour  Intake --  Output 900 ml  Net -900 ml   Filed Weights   01/20/20 1743  Weight: 69.9 kg    Exam:   General:  Awake alert somewhat anxious no acute distress  Cardiovascular: rrr no mgr no LE edema  Respiratory: normal effort BS clear bilaterally no wheeze  Abdomen: non-distended non-tender to palpation. +BS no guarding or rebouding  Musculoskeletal: joints without swelling/erythema   Data Reviewed: Basic Metabolic Panel: Recent Labs  Lab 01/20/20 1712 01/20/20 1720  NA 137 137  K 3.7 3.7  CL 106 105  CO2 20*  --   GLUCOSE 94 90  BUN 13 14  CREATININE 0.96 0.80  CALCIUM 8.9  --    Liver Function Tests: Recent Labs  Lab 01/20/20 1712  AST 21  ALT 15  ALKPHOS 62  BILITOT 0.5  PROT 6.6  ALBUMIN 3.9   No results for input(s): LIPASE, AMYLASE in the last 168 hours. No results for input(s): AMMONIA in the last 168 hours. CBC: Recent Labs  Lab 01/20/20 1712 01/20/20 1720  WBC 8.5  --   NEUTROABS 5.8  --   HGB 14.1 14.3  HCT 44.7 42.0  MCV 91.4  --   PLT 250  --    Cardiac Enzymes: No results for input(s): CKTOTAL, CKMB, CKMBINDEX, TROPONINI in the last 168 hours. BNP (last 3 results) Recent Labs    12/06/19 1625  BNP 26.8    ProBNP (last 3 results) No results for input(s): PROBNP in the last 8760 hours.  CBG: Recent Labs  Lab 01/20/20 1712  GLUCAP 89    Recent Results (from the past 240  hour(s))  SARS CORONAVIRUS 2 (TAT 6-24 HRS) Nasopharyngeal Nasopharyngeal Swab     Status: None   Collection Time: 01/20/20  9:13 PM   Specimen: Nasopharyngeal Swab  Result Value Ref Range Status   SARS Coronavirus 2 NEGATIVE NEGATIVE Final    Comment: (NOTE) SARS-CoV-2 target nucleic acids are NOT DETECTED. The SARS-CoV-2 RNA is generally detectable in upper and lower respiratory specimens during the acute phase of infection. Negative results do not preclude SARS-CoV-2 infection, do not rule out co-infections with other pathogens, and should not be used as the sole basis for treatment or other patient management decisions. Negative results must be combined with clinical observations, patient history, and epidemiological information. The expected result is Negative. Fact Sheet for Patients: SugarRoll.be Fact Sheet for Healthcare Providers: https://www.woods-mathews.com/ This test is not yet approved or cleared by the [place] FDA and  has been authorized for detection and/or diagnosis of SARS-CoV-2 by FDA under an Emergency Use Authorization (EUA). This EUA will remain  in effect (meaning this test can be used) for the duration of the COVID-19 declaration under Section 56 4(b)(1) of the Act, 21 U.S.C. section 360bbb-3(b)(1), unless the authorization is terminated or revoked sooner. Performed at [facility], [place] [address] [place], [place] [address]      Studies: MR BRAIN WO CONTRAST  Result Date: 01/20/2020 CLINICAL DATA:  74 year old female code stroke presentation today. Slurred speech, facial droop. EXAM: MRI HEAD WITHOUT CONTRAST TECHNIQUE: Multiplanar, multiecho pulse sequences of the brain and surrounding structures were obtained without intravenous contrast. COMPARISON:  Head CT 1728 hours today. Brain MRI 09/03/2014. FINDINGS: Brain: No restricted diffusion to suggest acute infarction. No midline shift, mass effect,  evidence of mass lesion, ventriculomegaly, extra-axial collection or acute intracranial hemorrhage. Cervicomedullary junction and pituitary are within normal limits. Evidence of a small chronic lacunar infarct of the right caudate nucleus (series 10 image 15) which is new or larger since 2015. Otherwise largely normal for age gray and white matter signal throughout the brain; mild for age nonspecific scattered white matter T2 and FLAIR hyperintensity. No cortical encephalomalacia or chronic cerebral blood products identified. Other deep gray nuclei, brainstem and cerebellum appear normal. Vascular: Major intracranial vascular flow voids are preserved and appear stable since 2015. Skull and upper cervical spine: Negative visible cervical spine. Visualized bone marrow signal is within normal limits. Sinuses/Orbits: Postoperative changes to both globes otherwise negative orbits. Paranasal sinuses and mastoids are stable and well pneumatized. Other: Visible internal auditory structures appear normal. Scalp and face soft tissues appear negative. IMPRESSION: No acute intracranial abnormality and largely normal for age noncontrast MRI appearance of the brain. Small chronic lacune of the right caudate nucleus. Electronically Signed   By: Genevie Ann M.D.   On: 01/20/2020 22:19   CT HEAD CODE STROKE WO CONTRAST  Result Date: 01/20/2020 CLINICAL DATA:  Code stroke.  74 year old female EXAM: CT HEAD WITHOUT CONTRAST TECHNIQUE: Contiguous axial images were obtained from the base of the skull through the vertex without  intravenous contrast. COMPARISON:  Head CT 12/06/2019. Brain MRI 08/24/2014. FINDINGS: Brain: Cerebral volume is stable. No acute intracranial hemorrhage identified. No midline shift, mass effect, or evidence of intracranial mass lesion. Stable gray-white matter differentiation throughout the brain. No cortically based acute infarct identified. No cortical encephalomalacia identified. Vascular: Calcified  atherosclerosis at the skull base. No suspicious intracranial vascular hyperdensity. Skull: No acute osseous abnormality identified. Sinuses/Orbits: Visualized paranasal sinuses and mastoids are stable and well pneumatized. Benign appearing bone lesion left maxillary sinus alveolar recess on series 3, image 4. Other: No acute orbit or scalp soft tissue findings. ASPECTS [facility] Stroke Program Early CT Score) Total score (0-10 with 10 being normal): 10 IMPRESSION: 1. Stable and negative for age noncontrast CT appearance of the brain. 2. ASPECTS is 10. 3. These results were communicated to Dr. Rory Percy at 5:41 pm on 01/20/2020 by text page via the [facility] messaging system. Electronically Signed   By: Genevie Ann M.D.   On: 01/20/2020 17:41    Scheduled Meds: . ALPRAZolam  1 mg Oral BID  . darifenacin  7.5 mg Oral Daily  . donepezil  10 mg Oral QHS  . DULoxetine  60 mg Oral Daily  . enoxaparin (LOVENOX) injection  40 mg Subcutaneous Daily  . isosorbide mononitrate  60 mg Oral Daily  . loratadine  10 mg Oral Daily  . metoprolol succinate  25 mg Oral Daily  . pantoprazole  40 mg Oral Daily  . [START ON 01/22/2020] predniSONE  50 mg Oral Q breakfast  . rosuvastatin  20 mg Oral q1800  . sodium chloride flush  3 mL Intravenous Once   Continuous Infusions:  Principal Problem:   Angioedema Active Problems:   HTN (hypertension), benign   Dysarthria   Chronic diastolic CHF, NYHA class 1   Multifactorial gait disorder   Anxiety and depression   Restrictive lung disease   Polypharmacy   Recurrent major depression-severe (HCC)   Hyperlipidemia    Time spent: 33 minutes    [place] NP  [facility]  If 7PM-7AM, please contact night-coverage at [email or web address] 01/21/2020, 12:35 PM  LOS: 0 days

## 2020-01-21 NOTE — Evaluation (Signed)
Clinical/Bedside Swallow Evaluation Patient Details  Name: Linda [name] MRN: [id number] Date of Birth: August 07, 1946  Today's Date: 01/21/2020 Time: SLP Start Time (ACUTE ONLY): 0850 SLP Stop Time (ACUTE ONLY): 0911 SLP Time Calculation (min) (ACUTE ONLY): 21 min  Past Medical History:  Past Medical History:  Diagnosis Date  . Acute upper GI bleed 06/2014   while pt taking coumadin, plavix, and meloxicam---despite being told not to take coumadin.  . Anginal pain ([place])    Nonobstructive CAD 2014; however, her cardiologist put her on a statin for this and NOT for hyperlipidemia per pt report.  Atyp CP 08/2017 at card f/u, plan for myoc perf imaging.  . Anxiety    panic attacks  . Asthma    w/ asbestososis   . BPPV (benign paroxysmal positional vertigo) 12/16/2012  . Chronic diastolic CHF (congestive heart failure) (HCC)    dry wt as of 11/06/16 is 168 lbs.  . Chronic lower back pain   . COPD (chronic obstructive pulmonary disease) ([place])   . DDD (degenerative disc disease)    lumbar and cervical.   . Diverticular disease   . Fibromyalgia    Patient states dx was around her late 23s but she had sx's for years prior to this.  . H/O hiatal hernia   . Hay fever   . History of pneumonia    hospitalized 12/2011, 02/2013, and 07/2013 [facility]) for this  . HTN (hypertension)    Renal artery dopplers 04/2013 neg for stenosis.  . Hypervitaminosis D 09/27/2019   over-supplemented.  Stopped vit D and plan recheck 2 mo.  . Idiopathic angio-edema-urticaria 72014   Angioedema component was very minimal  . Insomnia   . Iron deficiency anemia    Hematologist in [place], [place] did extensive w/u; no cause found; failed oral supplement;; gets fairly regular (q43m or so) IV iron infusions (Venofer -iron sucrose- 200mg  with procrit.  "for 14 yr I've been getting blood work q month & getting infusions prn" (07/12/2013).  Dr. Marin Olp locally, iron infusions done, EPO deficiency dx'd  . Migraine syndrome     "not as often anymore; used to be ~ q wk" (07/12/2013)  . Mixed incontinence urge and stress   . Nephrolithiasis    "passed all on my own or they are still in there" (07/12/2013)  . Neuroleptic induced Parkinsonism ([place]) 2018   Dr. Carles Collet, neuro, saw her 11/24/17 and recommended d/c of abilify as first step.  D/c'd abilify and pt got complete recovery.  . OSA on CPAP    prior to move to [place]--had another sleep study 10/2015 w/pulm Dr. Camillo Flaming.  . Osteoarthritis    "severe; progressing fast" (07/12/2013); multiple joints-not surgical candidate for TKR (03/2015).  Triamcinolon knee injections by Dr. Tessa Lerner 12/2017.  [name] Kitchen Pernicious anemia 08/24/2014  . Pleural plaque with presence of asbestos 07/22/2013  . Pulmonary embolism ([place]) 07/2013   Dx at [facility] with very small peripheral upper lobe pe 07/2013: pt took coumadin for about 8-9 mo  . Pyelonephritis    "several times over the last 30 yr" (07/12/2013)  . RBBB (right bundle branch block)   . Recurrent major depression ([place])   . Recurrent UTI    hx of hospitalization for pyelonephritis; started abx prophylaxis 06/2015  . Syncope    Hypotensive; ED visit--Dr. Terrence Dupont did Cath--nonobstructive CAD, EF 55-60%.  In retrospect, suspect pt rx med misuse/polypharmacy  . Tension headache, chronic    Past Surgical History:  Past Surgical History:  Procedure Laterality Date  .  APPENDECTOMY  1960  . AXILLARY SURGERY Left 1978   Multiple "lump" in armpit per pt  . CARDIAC CATHETERIZATION  01/2013   nonobstructive CAD, EF 55-60%  . CARDIOVASCULAR STRESS TEST  02/22/15   Low risk myocard perf imaging; wall motion normal, normal EF  . carotid duplex doppler  10/21/2017   R vertebral flow suggestive of possible distal obstruction.  Pt declined further w/u as of 10/29/17 but need to revisit this problem periodically.  . COCCYX REMOVAL  1972  . DEXA  06/05/2017   T-score -3.1  . DILATION AND CURETTAGE OF UTERUS  ? 1970's  . ESOPHAGOGASTRODUODENOSCOPY N/A 07/19/2014    Gastritis found + in the setting of supratherapeutic INR, +plavix, + meloxicam.  . EYE SURGERY Left 2012-2013   "injections for ~ 1 yr; don't really know what for" (07/12/2013)  . HEEL SPUR SURGERY Left 2008  . KNEE SURGERY  2005  . LEFT HEART CATHETERIZATION WITH CORONARY ANGIOGRAM N/A 01/30/2013   Procedure: LEFT HEART CATHETERIZATION WITH CORONARY ANGIOGRAM;  Surgeon: Clent Demark, MD;  Location: [facility] CATH LAB;  Service: Cardiovascular;  Laterality: N/A;  . PLANTAR FASCIA RELEASE Left 2008  . SPIROMETRY  04/25/14   In hosp for acute asthma/COPD flare: mixed obstructive and restrictive lung disease. The FEV1 is severely reduced at 45% predicted.  FEV1 signif decreased compared to prior spirometry 07/23/13.  . TENDON RELEASE  1996   Right forearm and hand  . TOTAL ABDOMINAL HYSTERECTOMY  1974  . TRANSTHORACIC ECHOCARDIOGRAM  01/2013; 04/2014;08/2015; 09/2017   2014--NORMAL.  2015--focal basal septal hypertrophy, EF 55-60%, grade I diast dysfxn, mild LAE.  08/2015 EF 55-60%, nl LV syst fxn, grade I DD, valves wnl. 10/21/17: EF 65-70%, grd I DD, o/w normal.   HPI:  Linda [name] is a 74 y.o. female with medical history significant of idiopathic angioedema, nonobstructive CAD, chronic diastolic congestive heart failure, COPD on 3 L home oxygen, PE, neuroleptic induced parkinsonism, hypertension, fibromyalgia, anxiety, asthma, and conditions listed below presenting to the ED with complaints of slurred speech, facial droop, and swelling of lips and tongue.  Pt denied dysphagia hx.    Assessment / Plan / Recommendation Clinical Impression  Pt was seen for a bedside swallow evaluation and she presents with oral dysphagia and possible pharyngeal dysphagia in the setting of angioedema.  Pt reported that she had been having difficulty swallowing beginning yesterday afternoon secondary to lingual, labial, and pharyngeal edema.  She reported that edema has decreased, but that she still requires 2-3 swallows per  bolus to clear her pharynx when consuming solid foods.  Oral mechanism exam was remarkable for generalized oral weakness, otherwise it was [facility]. Pt consumed trials of thin liquid, puree, and regular solids.  She exhibited good bolus acceptance, timely AP transport, and consistent hyolaryngeal elevation/excursion with thin liquid and puree trials.  Mastication and AP transport of regular solids was prolonged and pt stated that she would prefer to consume softer solids.  No clinical s/sx of aspiration were observed with any trials.  Suspect that the pt's oropharyngeal swallowing abilities will improve as edema continues to resolve.  Recommend diet change to Dysphagia 3 (soft) solids and thin liquids with medications administered whole with thin liquid or puree (per pt preference).  SLP will briefly f/u to monitor diet tolerance and to determine readiness for clinical diet upgrade.     SLP Visit Diagnosis: Dysphagia, unspecified (R13.10)    Aspiration Risk  Mild aspiration risk    Diet Recommendation  Dysphagia 3 (Mech soft);Thin liquid   Liquid Administration via: Cup;Straw Medication Administration: Whole meds with liquid Supervision: Patient able to self feed Compensations: Slow rate;Small sips/bites Postural Changes: Seated upright at 90 degrees    Other  Recommendations Oral Care Recommendations: Oral care BID   Follow up Recommendations None      Frequency and Duration min 1 x/week  2 weeks       Prognosis Prognosis for Safe Diet Advancement: Good      Swallow Study   General HPI: KAELIE BOISVERT is a 74 y.o. female with medical history significant of idiopathic angioedema, nonobstructive CAD, chronic diastolic congestive heart failure, COPD on 3 L home oxygen, PE, neuroleptic induced parkinsonism, hypertension, fibromyalgia, anxiety, asthma, and conditions listed below presenting to the ED with complaints of slurred speech, facial droop, and swelling of lips and tongue.  Pt denied  dysphagia hx.  Type of Study: Bedside Swallow Evaluation Previous Swallow Assessment: None  Diet Prior to this Study: Regular;Thin liquids Temperature Spikes Noted: No Respiratory Status: Nasal cannula History of Recent Intubation: No Behavior/Cognition: Alert;Cooperative;Pleasant mood Oral Cavity Assessment: Within Functional Limits Oral Care Completed by SLP: No Oral Cavity - Dentition: Dentures, top(natural dentition on bottom ) Vision: Functional for self-feeding Self-Feeding Abilities: Able to feed self Patient Positioning: Upright in bed Baseline Vocal Quality: Low vocal intensity Volitional Swallow: Able to elicit    Oral/Motor/Sensory Function Overall Oral Motor/Sensory Function: Generalized oral weakness Facial ROM: Within Functional Limits Facial Symmetry: Within Functional Limits Facial Sensation: Within Functional Limits Lingual ROM: Reduced right;Reduced left Lingual Symmetry: Within Functional Limits Mandible: Within Functional Limits   Ice Chips Ice chips: Not tested   Thin Liquid Thin Liquid: Within functional limits Presentation: Cup;Straw    Nectar Thick Nectar Thick Liquid: Not tested   Honey Thick Honey Thick Liquid: Not tested   Puree Puree: Within functional limits Presentation: Spoon   Solid     Solid: Impaired Presentation: Self Fed Oral Phase Impairments: Impaired mastication Oral Phase Functional Implications: Impaired mastication;Prolonged oral transit     Colin Mulders M.S., CCC-SLP Acute Rehabilitation Services Office: [phone]  [place] 01/21/2020,9:22 AM

## 2020-01-21 NOTE — Evaluation (Signed)
Physical Therapy Evaluation Patient Details Name: Linda [name] MRN: [id number] DOB: 1946/07/10 Today's Date: 01/21/2020   History of Present Illness  Patient is 74 year old female admitted with swollen tounge and lips, difficulty speaking. Testing negative for CVA. PMH includes: CAD, CHF, COPD, home O2, HTN, Fibromyalgia, Chronic back pain  Clinical Impression  Patient received in bed, reports significant back pain and rib pain while lying in bed. Reports she usually takes pain medicine to control her chronic pain, but has not had any since here. Patient agrees to PT assessment. Reports she is talking better this morning. Continues to have difficulty swallowing. Patient performed bed mobility and transfers with supervision. Ambulated 25 feet with rolling walker and min guard assist on 3 lpm O2. Patient limited by pain at this time. She will continue to benefit from skilled PT while here to improve mobility and strength.      Follow Up Recommendations No PT follow up    Equipment Recommendations  None recommended by PT    Recommendations for Other Services       Precautions / Restrictions Precautions Precautions: Fall Precaution Comments: mod fall Restrictions Weight Bearing Restrictions: No      Mobility  Bed Mobility Overal bed mobility: Independent             General bed mobility comments: independent supine>< sit  Transfers Overall transfer level: Needs assistance Equipment used: Rolling walker (2 wheeled) Transfers: Sit to/from Stand Sit to Stand: Supervision         General transfer comment: performs sit to stand with supervision only  Ambulation/Gait Ambulation/Gait assistance: Min guard;Supervision Gait Distance (Feet): 25 Feet Assistive device: Rolling walker (2 wheeled) Gait Pattern/deviations: Step-through pattern Gait velocity: WNL   General Gait Details: patient ambulates well despite pain reported, good pace, no LOB  Stairs             Wheelchair Mobility    Modified Rankin (Stroke Patients Only)       Balance Overall balance assessment: Modified Independent                                           Pertinent Vitals/Pain Pain Assessment: Faces Faces Pain Scale: Hurts whole lot Pain Location: back, ribs Pain Descriptors / Indicators: Aching;Discomfort;Grimacing;Guarding Pain Intervention(s): Monitored during session;RN gave pain meds during session    Home Living Family/patient expects to be discharged to:: Other (Comment)(independent living at [facility])                 Additional Comments: Ambulates with rollator at baseline    Prior Function Level of Independence: Independent with assistive device(s)               Hand Dominance   Dominant Hand: Right    Extremity/Trunk Assessment   Upper Extremity Assessment Upper Extremity Assessment: Overall WFL for tasks assessed    Lower Extremity Assessment Lower Extremity Assessment: Overall WFL for tasks assessed       Communication   Communication: Expressive difficulties  Cognition Arousal/Alertness: Awake/alert Behavior During Therapy: WFL for tasks assessed/performed Overall Cognitive Status: Within Functional Limits for tasks assessed                                        General Comments  Exercises     Assessment/Plan    PT Assessment Patient needs continued PT services  PT Problem List Decreased activity tolerance;Pain;Decreased mobility       PT Treatment Interventions Therapeutic exercise;Gait training;Functional mobility training;Therapeutic activities;Patient/family education    PT Goals (Current goals can be found in the Care Plan section)  Acute Rehab PT Goals Patient Stated Goal: to improve speech and swallowing and return home PT Goal Formulation: With patient Time For Goal Achievement: 01/28/20 Potential to Achieve Goals: Good    Frequency Min 2X/week    Barriers to discharge        Co-evaluation               AM-PAC PT "6 Clicks" Mobility  Outcome Measure Help needed turning from your back to your side while in a flat bed without using bedrails?: None Help needed moving from lying on your back to sitting on the side of a flat bed without using bedrails?: None Help needed moving to and from a bed to a chair (including a wheelchair)?: A Little Help needed standing up from a chair using your arms (e.g., wheelchair or bedside chair)?: A Little Help needed to walk in hospital room?: A Little Help needed climbing 3-5 steps with a railing? : A Little 6 Click Score: 20    End of Session Equipment Utilized During Treatment: Gait belt Activity Tolerance: Patient tolerated treatment well;Patient limited by pain Patient left: in bed;with bed alarm set;with call bell/phone within reach Nurse Communication: Mobility status PT Visit Diagnosis: Difficulty in walking, not elsewhere classified (R26.2);Pain Pain - Right/Left: Left Pain - part of body: (back and ribs)    Time: [id number] PT Time Calculation (min) (ACUTE ONLY): 22 min   Charges:   PT Evaluation $PT Eval Moderate Complexity: 1 Mod PT Treatments $Gait Training: 8-22 mins        Effie Janoski, PT, GCS 01/21/20,10:06 AM

## 2020-01-21 NOTE — Evaluation (Signed)
Speech Language Pathology Evaluation Patient Details Name: Linda [name] MRN: [id number] DOB: [birth year] Today's Date: 01/21/2020 Time: [id number] SLP Time Calculation (min) (ACUTE ONLY): 39 min  Problem List:  Patient Active Problem List   Diagnosis Date Noted  . Slurred speech 01/21/2020  . Angioedema 01/20/2020  . Dysarthria 01/20/2020  . Hypotension 10/04/2019  . COPD (chronic obstructive pulmonary disease) ([place]) 01/21/2018  . Pulmonary asbestosis ([place]) 01/21/2018  . Influenza A 01/02/2018  . Hypomagnesemia 01/02/2018  . Failure to thrive in adult 01/01/2018  . Altered mental status 10/21/2017  . Lumbar radiculitis 02/10/2017  . Dehydration 12/04/2016  . Low serum erythropoietin level 10/17/2016  . RBBB 09/23/2016  . Primary osteoarthritis of left knee 06/19/2016  . Debilitated patient 06/06/2016  . CHF (congestive heart failure) ([place]) 05/30/2016  . SOB (shortness of breath)   . Chronic respiratory failure with hypoxia ([place]) 02/07/2016  . Restrictive lung disease 02/07/2016  . Rotator cuff syndrome of right shoulder 01/10/2016  . UTI (urinary tract infection) 01/01/2016  . Encephalopathy, metabolic [id number]  . CAP (community acquired pneumonia) 01/01/2016  . Fall at home 01/01/2016  . Rhabdomyolysis 01/01/2016  . Diastolic heart failure ([place]) 10/16/2015  . CAD-minor 2014 08/16/2015  . Chest pain with moderate risk for cardiac etiology 08/16/2015  . Narrowing of intervertebral disc space 07/17/2015  . Bilateral lower leg pain 01/24/2015  . 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 Chronic pain syndrome 11/21/2014  . Anxiety and depression 11/21/2014  . Abnormal grief reaction 11/21/2014  . Severe persistent asthma 11/21/2014  . Hypokalemia 11/21/2014  . Hyperlipidemia 11/21/2014  . Chronic pain disorder 11/21/2014  . Primary osteoarthritis of right knee 10/18/2014  . Pernicious anemia 08/24/2014  . Generalized anxiety disorder--with occasional panic attacks.   08/05/2014  . Recurrent major depression-severe ([place]) 08/05/2014  . Multifactorial gait disorder 07/26/2014  . Epistaxis 07/18/2014  . Acute GI bleeding 07/17/2014  . Anemia associated with acute blood loss 07/17/2014  . Syncope 07/17/2014  . History of pulmonary embolism 07/17/2014  . GI bleed 07/17/2014  . Arthritis 05/11/2014  . DDD (degenerative disc disease) 05/11/2014  . Fibrositis 05/11/2014  . Amianthosis ([place]) 05/11/2014  . [name] ([place]) 05/11/2014  . Grief 04/28/2014  . OSA (obstructive sleep apnea) 04/24/2014  . Chronic diastolic CHF, NYHA class 1 04/24/2014  . Acute respiratory failure ([place]) 08/20/2013  . Atelectasis 08/06/2013  . Acute pulmonary embolism ([place]) 08/04/2013  . Pulmonary embolism ([place]) 08/04/2013  . Pleural plaque with presence of asbestos 07/22/2013  . Polypharmacy 04/26/2013  . Fibromyalgia syndrome 03/01/2013  . Insomnia 11/12/2012  . HTN (hypertension), benign 10/25/2012   Past Medical History:  Past Medical History:  Diagnosis Date  . Acute upper GI bleed 06/2014   while pt taking coumadin, plavix, and meloxicam---despite being told not to take coumadin.  . Anginal pain ([place])    Nonobstructive CAD 2014; however, her cardiologist put her on a statin for this and NOT for hyperlipidemia per pt report.  Atyp CP 08/2017 at card f/u, plan for myoc perf imaging.  . Anxiety    panic attacks  . Asthma    w/ asbestososis   . BPPV (benign paroxysmal positional vertigo) 12/16/2012  . Chronic diastolic CHF (congestive heart failure) ([facility])    dry wt as of 11/06/16 is 168 lbs.  . Chronic lower back pain   . COPD (chronic obstructive pulmonary disease) ([place])   . DDD (degenerative disc disease)    lumbar and cervical.   . Diverticular  disease   . Fibromyalgia    Patient states dx was around her late 5s but she had sx's for years prior to this.  . H/O hiatal hernia   . Hay fever   . History of pneumonia    hospitalized 12/2011, 02/2013, and 07/2013  [facility]) for this  . HTN (hypertension)    Renal artery dopplers 04/2013 neg for stenosis.  . Hypervitaminosis D 09/27/2019   over-supplemented.  Stopped vit D and plan recheck 2 mo.  . Idiopathic angio-edema-urticaria 72014   Angioedema component was very minimal  . Insomnia   . Iron deficiency anemia    Hematologist in [place], [place] did extensive w/u; no cause found; failed oral supplement;; gets fairly regular (q76m or so) IV iron infusions (Venofer -iron sucrose- 200mg  with procrit.  "for 14 yr I've been getting blood work q month & getting infusions prn" (07/12/2013).  Dr. Marin Olp locally, iron infusions done, EPO deficiency dx'd  . Migraine syndrome    "not as often anymore; used to be ~ q wk" (07/12/2013)  . Mixed incontinence urge and stress   . Nephrolithiasis    "passed all on my own or they are still in there" (07/12/2013)  . Neuroleptic induced Parkinsonism ([place]) 2018   Dr. Carles Collet, neuro, saw her 11/24/17 and recommended d/c of abilify as first step.  D/c'd abilify and pt got complete recovery.  . OSA on CPAP    prior to move to [place]--had another sleep study 10/2015 w/pulm Dr. Camillo Flaming.  . Osteoarthritis    "severe; progressing fast" (07/12/2013); multiple joints-not surgical candidate for TKR (03/2015).  Triamcinolon knee injections by Dr. Tessa Lerner 12/2017.  [name] Kitchen Pernicious anemia 08/24/2014  . Pleural plaque with presence of asbestos 07/22/2013  . Pulmonary embolism ([place]) 07/2013   Dx at [facility] with very small peripheral upper lobe pe 07/2013: pt took coumadin for about 8-9 mo  . Pyelonephritis    "several times over the last 30 yr" (07/12/2013)  . RBBB (right bundle branch block)   . Recurrent major depression ([place])   . Recurrent UTI    hx of hospitalization for pyelonephritis; started abx prophylaxis 06/2015  . Syncope    Hypotensive; ED visit--Dr. Terrence Dupont did Cath--nonobstructive CAD, EF 55-60%.  In retrospect, suspect pt rx med misuse/polypharmacy  . Tension headache, chronic    Past Surgical  History:  Past Surgical History:  Procedure Laterality Date  . APPENDECTOMY  1960  . AXILLARY SURGERY Left 1978   Multiple "lump" in armpit per pt  . CARDIAC CATHETERIZATION  01/2013   nonobstructive CAD, EF 55-60%  . CARDIOVASCULAR STRESS TEST  02/22/15   Low risk myocard perf imaging; wall motion normal, normal EF  . carotid duplex doppler  10/21/2017   R vertebral flow suggestive of possible distal obstruction.  Pt declined further w/u as of 10/29/17 but need to revisit this problem periodically.  . COCCYX REMOVAL  1972  . DEXA  06/05/2017   T-score -3.1  . DILATION AND CURETTAGE OF UTERUS  ? 1970's  . ESOPHAGOGASTRODUODENOSCOPY N/A 07/19/2014   Gastritis found + in the setting of supratherapeutic INR, +plavix, + meloxicam.  . EYE SURGERY Left 2012-2013   "injections for ~ 1 yr; don't really know what for" (07/12/2013)  . HEEL SPUR SURGERY Left 2008  . KNEE SURGERY  2005  . LEFT HEART CATHETERIZATION WITH CORONARY ANGIOGRAM N/A 01/30/2013   Procedure: LEFT HEART CATHETERIZATION WITH CORONARY ANGIOGRAM;  Surgeon: Clent Demark, MD;  Location: [facility] CATH LAB;  Service:  Cardiovascular;  Laterality: N/A;  . PLANTAR FASCIA RELEASE Left 2008  . SPIROMETRY  04/25/14   In hosp for acute asthma/COPD flare: mixed obstructive and restrictive lung disease. The FEV1 is severely reduced at 45% predicted.  FEV1 signif decreased compared to prior spirometry 07/23/13.  . TENDON RELEASE  1996   Right forearm and hand  . TOTAL ABDOMINAL HYSTERECTOMY  1974  . TRANSTHORACIC ECHOCARDIOGRAM  01/2013; 04/2014;08/2015; 09/2017   2014--NORMAL.  2015--focal basal septal hypertrophy, EF 55-60%, grade I diast dysfxn, mild LAE.  08/2015 EF 55-60%, nl LV syst fxn, grade I DD, valves wnl. 10/21/17: EF 65-70%, grd I DD, o/w normal.   HPI:  Linda [name] is a 74 y.o. female with medical history significant of idiopathic angioedema, nonobstructive CAD, chronic diastolic congestive heart failure, COPD on 3 L home oxygen, PE,  neuroleptic induced parkinsonism, hypertension, fibromyalgia, anxiety, asthma, and conditions listed below presenting to the ED with complaints of slurred speech, facial droop, and swelling of lips and tongue.  Pt denied dysphagia hx.    Assessment / Plan / Recommendation Clinical Impression  Pt was seen for a cognitive-linguistic evaluation and she completed the [facility] Cognitive Assessment [facility]) in addition to informal evaluation measures.  Pt was encountered awake/alert and her son was present at bedside for this evaluation.  She reported that she lives at an independent living facility and that she is responsible for most of her IADLs (medications, finances, etc.) at baseline.  She scored overall 28/30 on the [facility] which is WNL (norm is >/= 26/30).  See below for full results.  Pt exhibited some difficulty with the executive functioning tasks including the clock drawing; otherwise no cognitive deficits were observed.  Expressive and receptive language abilities were functional.  Oral mechanism exam was remarkable for generalized oral weakness and mild dysarthria was observed at the word, phrase, and sentence level.  Speech was approximately 85% intelligible to an unfamiliar listener and pt was observed to independently over-articulate and slow her rate of speech to increase intelligibility.  Pt and pt's son both reported improvement in speech since last night, but they also stated that the pt's speech has not yet returned to baseline.  Diadochokinetic speech tasks revealed slowed rate and varying prosody of speech.  Additionally observed intermittent stuttering quality in the pt's speech when she appeared more stressed or frustrated.  If dysarthria and additional speech changes persist, recommend home health ST at time of discharge.  Additionally recommend brief supervision with IADLs when pt is first discharged.  Spoke with pt and son in depth regarding results of evaluation and recommendations.  Both  verbalized understanding.  SLP will f/u acutely targeting dysarthria.     [place] Cognitive Assessment (MOCA)  Visuospatial/Executive function  3/5  Naming 3/3  Memory  5/5  Attention 6/6  Language  3/3  Abstraction 2/2  Orientation 6/6  Total  28/30  Norm is >/= 26/30      SLP Assessment  SLP Recommendation/Assessment: Patient needs continued Speech Lanaguage Pathology Services SLP Visit Diagnosis: Cognitive communication deficit (R41.841);Dysarthria and anarthria (R47.1)    Follow Up Recommendations  Home health SLP    Frequency and Duration min 2x/week  2 weeks      SLP Evaluation Cognition  Overall Cognitive Status: Within Functional Limits for tasks assessed Arousal/Alertness: Awake/alert Orientation Level: Oriented X4 Attention: Sustained;Alternating Sustained Attention: Appears intact Alternating Attention: Impaired Alternating Attention Impairment: Functional complex Memory: Appears intact Immediate Memory Recall: Sock;Blue;Bed Memory Recall Sock: Without Cue Memory Recall  Blue: Without Cue Memory Recall Bed: Without Cue Awareness: Appears intact Problem Solving: Appears intact Executive Function: Sequencing;Organizing Sequencing: Impaired Sequencing Impairment: Verbal complex Organizing: Impaired Organizing Impairment: Verbal complex Safety/Judgment: Appears intact       Comprehension  Auditory Comprehension Overall Auditory Comprehension: Appears within functional limits for tasks assessed    Expression Expression Primary Mode of Expression: Verbal Verbal Expression Overall Verbal Expression: Appears within functional limits for tasks assessed Written Expression Dominant Hand: Right   Oral / Motor  Oral Motor/Sensory Function Overall Oral Motor/Sensory Function: Generalized oral weakness Facial ROM: Within Functional Limits Facial Symmetry: Within Functional Limits Facial Sensation: Within Functional Limits Lingual ROM: Reduced  right;Reduced left Lingual Symmetry: Within Functional Limits Motor Speech Overall Motor Speech: Impaired Respiration: Within functional limits Phonation: Low vocal intensity Resonance: Within functional limits Articulation: Impaired Level of Impairment: Sentence Intelligibility: Intelligibility reduced Word: 75-100% accurate Phrase: 75-100% accurate Sentence: 75-100% accurate Conversation: 75-100% accurate Motor Planning: Witnin functional limits Effective Techniques: Slow rate;Increased vocal intensity;Over-articulate;Pause                      Colin Mulders., M.S., [facility] Office: [phone]  [place] 01/21/2020, 3:59 PM

## 2020-01-21 NOTE — Progress Notes (Signed)
MRI neg for stroke. No neurological work up further. Please call with questions  -- Amie Portland, MD [facility]

## 2020-01-21 NOTE — TOC Initial Note (Signed)
Transition of Care [facility]) - Initial/Assessment Note    Patient Details  Name: Linda [name] MRN: [id number] Date of Birth: [birth year]  Transition of Care [facility]) CM/SW Contact:    Pollie Friar, RN Phone Number: 01/21/2020, 1:10 PM  Clinical Narrative:                 Pt is from [place]. She lives alone but states the facility checks on them each am.  She states [facility] provides transportation to MD appts as needed.  She denies any issues with her home medications.  TOC following for d/c needs.   Expected Discharge Plan: Home/Self Care Barriers to Discharge: Continued Medical Work up   Patient Goals and CMS Choice        Expected Discharge Plan and Services Expected Discharge Plan: Home/Self Care   Discharge Planning Services: CM Consult   Living arrangements for the past 2 months: Apartment, [place]                                      Prior Living Arrangements/Services Living arrangements for the past 2 months: [place], [place] Lives with:: Facility Resident Patient language and need for interpreter reviewed:: Yes Do you feel safe going back to the place where you live?: Yes      Need for Family Participation in Patient Care: No (Comment) Care giver support system in place?: No (comment)(facility checks on the residents daily) Current home services: DME(rollator, oxygen through [employer], cane, shower seat, elevated toilet) Criminal Activity/Legal Involvement Pertinent to Current Situation/Hospitalization: No - Comment as needed  Activities of Daily Living      Permission Sought/Granted                  Emotional Assessment Appearance:: Appears stated age Attitude/Demeanor/Rapport: Engaged Affect (typically observed): Accepting Orientation: : Oriented to Self, Oriented to Place, Oriented to  Time, Oriented to Situation   Psych Involvement: No (comment)  Admission diagnosis:  Slurred  speech [R47.81] Patient Active Problem List   Diagnosis Date Noted  . Angioedema 01/20/2020  . Dysarthria 01/20/2020  . Hypotension 10/04/2019  . COPD (chronic obstructive pulmonary disease) ([place]) 01/21/2018  . Pulmonary asbestosis ([place]) 01/21/2018  . Influenza A 01/02/2018  . Hypomagnesemia 01/02/2018  . Failure to thrive in adult 01/01/2018  . Altered mental status 10/21/2017  . Lumbar radiculitis 02/10/2017  . Dehydration 12/04/2016  . Low serum erythropoietin level 10/17/2016  . RBBB 09/23/2016  . Primary osteoarthritis of left knee 06/19/2016  . Debilitated patient 06/06/2016  . CHF (congestive heart failure) ([place]) 05/30/2016  . SOB (shortness of breath)   . Chronic respiratory failure with hypoxia ([place]) 02/07/2016  . Restrictive lung disease 02/07/2016  . Rotator cuff syndrome of right shoulder 01/10/2016  . UTI (urinary tract infection) 01/01/2016  . Encephalopathy, metabolic [id number]  . CAP (community acquired pneumonia) 01/01/2016  . Fall at home 01/01/2016  . Rhabdomyolysis 01/01/2016  . Diastolic heart failure ([place]) 10/16/2015  . CAD-minor 2014 08/16/2015  . Chest pain with moderate risk for cardiac etiology 08/16/2015  . Narrowing of intervertebral disc space 07/17/2015  . Bilateral lower leg pain 01/24/2015  . 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 Chronic pain syndrome 11/21/2014  . Anxiety and depression 11/21/2014  . Abnormal grief reaction 11/21/2014  . Severe persistent asthma 11/21/2014  . Hypokalemia 11/21/2014  . Hyperlipidemia 11/21/2014  .  Chronic pain disorder 11/21/2014  . Primary osteoarthritis of right knee 10/18/2014  . Pernicious anemia 08/24/2014  . Generalized anxiety disorder--with occasional panic attacks.  08/05/2014  . Recurrent major depression-severe ([place]) 08/05/2014  . Multifactorial gait disorder 07/26/2014  . Epistaxis 07/18/2014  . Acute GI bleeding 07/17/2014  . Anemia associated with acute blood loss 07/17/2014  .  Syncope 07/17/2014  . History of pulmonary embolism 07/17/2014  . GI bleed 07/17/2014  . Arthritis 05/11/2014  . DDD (degenerative disc disease) 05/11/2014  . Fibrositis 05/11/2014  . Amianthosis ([place]) 05/11/2014  . [name] ([place]) 05/11/2014  . Grief 04/28/2014  . OSA (obstructive sleep apnea) 04/24/2014  . Chronic diastolic CHF, NYHA class 1 04/24/2014  . Acute respiratory failure ([place]) 08/20/2013  . Atelectasis 08/06/2013  . Acute pulmonary embolism ([place]) 08/04/2013  . Pulmonary embolism ([place]) 08/04/2013  . Pleural plaque with presence of asbestos 07/22/2013  . Polypharmacy 04/26/2013  . Fibromyalgia syndrome 03/01/2013  . Insomnia 11/12/2012  . HTN (hypertension), benign 10/25/2012   PCP:  Tammi Sou, MD Pharmacy:   [facility], [address] [place] [address] Phone: [phone] Fax: [phone]     Social Determinants of Health (SDOH) Interventions    Readmission Risk Interventions No flowsheet data found.

## 2020-01-22 DIAGNOSIS — Z79899 Other long term (current) drug therapy: Secondary | ICD-10-CM

## 2020-01-22 DIAGNOSIS — R471 Dysarthria and anarthria: Secondary | ICD-10-CM

## 2020-01-22 DIAGNOSIS — T783XXA Angioneurotic edema, initial encounter: Principal | ICD-10-CM

## 2020-01-22 MED ORDER — ALPRAZOLAM 0.5 MG PO TABS
1.0000 mg | ORAL_TABLET | Freq: Three times a day (TID) | ORAL | Status: DC
  Administered 2020-01-22: 1 mg via ORAL
  Filled 2020-01-22 (×2): qty 2

## 2020-01-22 MED ORDER — PREDNISONE 10 MG PO TABS: ORAL_TABLET | ORAL | 0 refills | Status: DC

## 2020-01-22 MED ORDER — EPINEPHRINE 0.3 MG/0.3ML IJ SOAJ: 0.3000 mg | INTRAMUSCULAR | 1 refills | Status: DC | PRN

## 2020-01-22 NOTE — Progress Notes (Signed)
AVS reviewed with patient, and patient given a copy. All lines removed, patient dressed, belongings packed, and patient taken via wheelchair by nurse to son's car for discharge.

## 2020-01-22 NOTE — Discharge Summary (Signed)
Physician Discharge Summary  Linda [name] [id number] DOB: 08-09-1946 DOA: 01/20/2020  PCP: Tammi Sou, MD  Admit date: 01/20/2020 Discharge date: 01/22/2020  Admitted From: Independent living Discharge disposition: inDependent living   Recommendations for Outpatient Follow-Up:   1. Patient declined home speech therapy 2. Polypharmacy 3. Prednisone taper 4. Referral to allergy   Discharge Diagnosis:   Principal Problem:   Angioedema Active Problems:   HTN (hypertension), benign   Polypharmacy   Chronic diastolic CHF, NYHA class 1   Multifactorial gait disorder   Recurrent major depression-severe (HCC)   Anxiety and depression   Hyperlipidemia   Restrictive lung disease   Dysarthria   Slurred speech    Discharge Condition: Improved.  Diet recommendation: Low sodium, heart healthy  Wound care: None.  Code status: Full.   History of Present Illness:   Linda [name] is a 74 y.o. female with medical history significant of idiopathic angioedema, nonobstructive CAD, chronic diastolic congestive heart failure, COPD on 3 L home oxygen, PE, neuroleptic induced parkinsonism, hypertension, fibromyalgia, anxiety, asthma, and conditions listed below presenting to the ED with complaints of slurred speech, facial droop, and swelling of lips and tongue.  Patient states in the afternoon around 3 PM she noticed that her lips and tongue were swollen.  She was having difficulty speaking.  She was told that her face was drooping.  Denies any recent insect sting.  She has not tried any new foods.  No new make-up or lipstick.  No additional history could be obtained from her.  ED Course: On arrival, patient noted to have swollen lips and tongue.  No dyspnea, stridor, or drooling.  No signs of airway obstruction.  Head CT negative for acute stroke.  Patient was seen by neurology and no facial droop noted.  Her dysarthria was thought to be secondary to edema of  lips and tongue.  In addition, she is on multiple sedating medications at home which were also thought to be possibly contributing.  Brain MRI was done which also did not show evidence of acute intracranial abnormality. Patient received EpiPen, Benadryl, Solu-Medrol   Hospital Course by Problem:    angioedema: Patient states the only other episode she had was back 53 years ago when she was allergic to penicillin  -Patient noted to have swollen lips and tongue on admission.  No dyspnea, stridor, or drooling. No signs of airway obstruction. Angioedema could be possibly due to an allergic component but there was report of a documented history of idiopathic angioedema?[name] Kitchen No ACE inhibitor or ARB listed in home medications.  -Denies any new medicine or any new foods  C4 complement pending.  Patient received EpiPen, Benadryl, and Solu-Medrol x3 doses.  -transition Solu-Medrol to po prednisnone -Please ensure follow-up with an allergist-- referral placed  Dysarthria.  - Initially speech clear and fluid. Later in morning suddenly developed stutter. Significance not clear.  CT and brain MRI not suggestive of acute stroke. Seen by neurology and her dysarthria is thought to be secondary to edema of lips and tongue. In addition, takes multiple sedating medications at home which could also possibly be contributing. Evaluated by ST who opine no risk aspiration and likely improve as swelling continues to resolve. -Avoid sedating medications Patient declines outpatient speech therapy  Chronic diastolic congestive heart failure. Compensated.   COPD.  -Combivent inhaler as needed  Hypertension. BP high end of normal. Home meds include Imdur and Metoprolol -resume home meds -monitor  Anxiety/depression. Appears  somewhat anxious. Home meds include anti-anxiety and anti-depressants.  -will resume home meds judiciously -monitor  Chronic pain. Home meds include oxy 10mg  every 6 hours.  -resume  home meds    Medical Consultants:   Neurology   Discharge Exam:   Vitals:   01/22/20 0307 01/22/20 0752  BP: (!) 142/71 137/72  Pulse: 67 69  Resp: 15 17  Temp: 97.7 F (36.5 C) 98 F (36.7 C)  SpO2: 96%    Vitals:   01/21/20 1912 01/21/20 2329 01/22/20 0307 01/22/20 0752  BP: (!) 153/74 134/69 (!) 142/71 137/72  Pulse: 93 80 67 69  Resp: 18 18 15 17   Temp: 98.1 F (36.7 C) 97.8 F (36.6 C) 97.7 F (36.5 C) 98 F (36.7 C)  TempSrc: Oral Oral Oral Oral  SpO2: 97% 95% 96%   Weight:      Height:        General exam: Appears calm and comfortable.  The results of significant diagnostics from this hospitalization (including imaging, microbiology, ancillary and laboratory) are listed below for reference.     Procedures and Diagnostic Studies:   MR BRAIN WO CONTRAST  Result Date: 01/20/2020 CLINICAL DATA:  74 year old female code stroke presentation today. Slurred speech, facial droop. EXAM: MRI HEAD WITHOUT CONTRAST TECHNIQUE: Multiplanar, multiecho pulse sequences of the brain and surrounding structures were obtained without intravenous contrast. COMPARISON:  Head CT 1728 hours today. Brain MRI 09/03/2014. FINDINGS: Brain: No restricted diffusion to suggest acute infarction. No midline shift, mass effect, evidence of mass lesion, ventriculomegaly, extra-axial collection or acute intracranial hemorrhage. Cervicomedullary junction and pituitary are within normal limits. Evidence of a small chronic lacunar infarct of the right caudate nucleus (series 10 image 15) which is new or larger since 2015. Otherwise largely normal for age gray and white matter signal throughout the brain; mild for age nonspecific scattered white matter T2 and FLAIR hyperintensity. No cortical encephalomalacia or chronic cerebral blood products identified. Other deep gray nuclei, brainstem and cerebellum appear normal. Vascular: Major intracranial vascular flow voids are preserved and appear stable since  2015. Skull and upper cervical spine: Negative visible cervical spine. Visualized bone marrow signal is within normal limits. Sinuses/Orbits: Postoperative changes to both globes otherwise negative orbits. Paranasal sinuses and mastoids are stable and well pneumatized. Other: Visible internal auditory structures appear normal. Scalp and face soft tissues appear negative. IMPRESSION: No acute intracranial abnormality and largely normal for age noncontrast MRI appearance of the brain. Small chronic lacune of the right caudate nucleus. Electronically Signed   By: Genevie Ann M.D.   On: 01/20/2020 22:19   CT HEAD CODE STROKE WO CONTRAST  Result Date: 01/20/2020 CLINICAL DATA:  Code stroke.  74 year old female EXAM: CT HEAD WITHOUT CONTRAST TECHNIQUE: Contiguous axial images were obtained from the base of the skull through the vertex without intravenous contrast. COMPARISON:  Head CT 12/06/2019. Brain MRI 08/24/2014. FINDINGS: Brain: Cerebral volume is stable. No acute intracranial hemorrhage identified. No midline shift, mass effect, or evidence of intracranial mass lesion. Stable gray-white matter differentiation throughout the brain. No cortically based acute infarct identified. No cortical encephalomalacia identified. Vascular: Calcified atherosclerosis at the skull base. No suspicious intracranial vascular hyperdensity. Skull: No acute osseous abnormality identified. Sinuses/Orbits: Visualized paranasal sinuses and mastoids are stable and well pneumatized. Benign appearing bone lesion left maxillary sinus alveolar recess on series 3, image 4. Other: No acute orbit or scalp soft tissue findings. ASPECTS [facility] Stroke Program Early CT Score) Total score (0-10 with 10 being normal):  10 IMPRESSION: 1. Stable and negative for age noncontrast CT appearance of the brain. 2. ASPECTS is 10. 3. These results were communicated to Dr. Rory Percy at 5:41 pm on 01/20/2020 by text page via the [facility] messaging system. Electronically  Signed   By: Genevie Ann M.D.   On: 01/20/2020 17:41     Labs:   Basic Metabolic Panel: Recent Labs  Lab 01/20/20 1712 01/20/20 1720  NA 137 137  K 3.7 3.7  CL 106 105  CO2 20*  --   GLUCOSE 94 90  BUN 13 14  CREATININE 0.96 0.80  CALCIUM 8.9  --    GFR Estimated Creatinine Clearance: 60.1 mL/min (by C-G formula based on SCr of 0.8 mg/dL). Liver Function Tests: Recent Labs  Lab 01/20/20 1712  AST 21  ALT 15  ALKPHOS 62  BILITOT 0.5  PROT 6.6  ALBUMIN 3.9   No results for input(s): LIPASE, AMYLASE in the last 168 hours. No results for input(s): AMMONIA in the last 168 hours. Coagulation profile Recent Labs  Lab 01/20/20 1712  INR 1.0    CBC: Recent Labs  Lab 01/20/20 1712 01/20/20 1720  WBC 8.5  --   NEUTROABS 5.8  --   HGB 14.1 14.3  HCT 44.7 42.0  MCV 91.4  --   PLT 250  --    Cardiac Enzymes: No results for input(s): CKTOTAL, CKMB, CKMBINDEX, TROPONINI in the last 168 hours. BNP: Invalid input(s): POCBNP CBG: Recent Labs  Lab 01/20/20 1712  GLUCAP 89   D-Dimer No results for input(s): DDIMER in the last 72 hours. Hgb A1c No results for input(s): HGBA1C in the last 72 hours. Lipid Profile No results for input(s): CHOL, HDL, LDLCALC, TRIG, CHOLHDL, LDLDIRECT in the last 72 hours. Thyroid function studies No results for input(s): TSH, T4TOTAL, T3FREE, THYROIDAB in the last 72 hours.  Invalid input(s): FREET3 Anemia work up No results for input(s): VITAMINB12, FOLATE, FERRITIN, TIBC, IRON, RETICCTPCT in the last 72 hours. Microbiology Recent Results (from the past 240 hour(s))  SARS CORONAVIRUS 2 (TAT 6-24 HRS) Nasopharyngeal Nasopharyngeal Swab     Status: None   Collection Time: 01/20/20  9:13 PM   Specimen: Nasopharyngeal Swab  Result Value Ref Range Status   SARS Coronavirus 2 NEGATIVE NEGATIVE Final    Comment: (NOTE) SARS-CoV-2 target nucleic acids are NOT DETECTED. The SARS-CoV-2 RNA is generally detectable in upper and  lower respiratory specimens during the acute phase of infection. Negative results do not preclude SARS-CoV-2 infection, do not rule out co-infections with other pathogens, and should not be used as the sole basis for treatment or other patient management decisions. Negative results must be combined with clinical observations, patient history, and epidemiological information. The expected result is Negative. Fact Sheet for Patients: SugarRoll.be Fact Sheet for Healthcare Providers: https://www.woods-mathews.com/ This test is not yet approved or cleared by the [place] FDA and  has been authorized for detection and/or diagnosis of SARS-CoV-2 by FDA under an Emergency Use Authorization (EUA). This EUA will remain  in effect (meaning this test can be used) for the duration of the COVID-19 declaration under Section 56 4(b)(1) of the Act, 21 U.S.C. section 360bbb-3(b)(1), unless the authorization is terminated or revoked sooner. Performed at [facility], [place] [address] [place], [place] [address]   MRSA PCR Screening     Status: None   Collection Time: 01/21/20 12:48 PM   Specimen: Nasal Mucosa; Nasopharyngeal  Result Value Ref Range Status   MRSA  by PCR NEGATIVE NEGATIVE Final    Comment:        The GeneXpert MRSA Assay (FDA approved for NASAL specimens only), is one component of a comprehensive MRSA colonization surveillance program. It is not intended to diagnose MRSA infection nor to guide or monitor treatment for MRSA infections. Performed at [facility], [place] [address], [place], [place] [address]      Discharge Instructions:   Discharge Instructions    Ambulatory referral to Allergy   Complete by: As directed    Diet - low sodium heart healthy   Complete by: As directed    Discharge instructions   Complete by: As directed    If you decide that you would like follow up with Speech therapy, please let your PCP  know   Increase activity slowly   Complete by: As directed      Allergies as of 01/22/2020      Reactions   Abilify [aripiprazole] Other (See Comments)   parkinsonism   Penicillins Itching, Swelling, Rash   Tolerated Cefepime in ED. Has patient had a PCN reaction causing immediate rash, facial/tongue/throat swelling, SOB or lightheadedness with hypotension: Yes Has patient had a PCN reaction causing severe rash involving mucus membranes or skin necrosis: No Has patient had a PCN reaction that required hospitalization: No  Has patient had a PCN reaction occurring within the last 10 years: No      Medication List    STOP taking these medications   ciprofloxacin 250 MG tablet Commonly known as: Cipro     TAKE these medications   albuterol 108 (90 Base) MCG/ACT inhaler Commonly known as: VENTOLIN HFA Inhale 1-2 puffs into the lungs every 6 (six) hours as needed for wheezing or shortness of breath.   alendronate 70 MG tablet Commonly known as: FOSAMAX Take 1 tablet (70 mg total) by mouth every 7 (seven) days. Take with a full glass of water on an empty stomach, first thing in the morning and remain up right for 30 minutes after taking.   ALPRAZolam 1 MG tablet Commonly known as: XANAX TAKE 1 TABLET THREE TIMES DAILY AS NEEDED FOR ANXIETY. What changed: See the new instructions.   aspirin 81 MG tablet Take 81 mg by mouth at bedtime.   diclofenac sodium 1 % Gel Commonly known as: VOLTAREN Apply 2 g topically 4 (four) times daily.   donepezil 10 MG tablet Commonly known as: Aricept Take 1 tablet (10 mg total) by mouth at bedtime.   DULoxetine 60 MG capsule Commonly known as: CYMBALTA TAKE 1 CAPSULE A DAY TO BE COMBINED WITH 30MG  CAPSULE What changed: See the new instructions.   DULoxetine 30 MG capsule Commonly known as: CYMBALTA TAKE 1 CAPSULE ONCE DAILY ALONG WITH 60MG  CAPSULE FOR TOTAL 90MG  DAILY. What changed: Another medication with the same name was changed. Make  sure you understand how and when to take each.   EPINEPHrine 0.3 mg/0.3 mL Soaj injection Commonly known as: EPI-PEN Inject 0.3 mLs (0.3 mg total) into the muscle as needed for anaphylaxis.   fluconazole 100 MG tablet Commonly known as: Diflucan 1 tab po qd prn thrush What changed:   how much to take  how to take this  when to take this  reasons to take this  additional instructions   fluticasone 50 MCG/ACT nasal spray Commonly known as: FLONASE Place 2 sprays into both nostrils daily.   furosemide 40 MG tablet Commonly known as: LASIX Take 1 tablet (40 mg  total) by mouth daily as needed for fluid.   ipratropium 0.03 % nasal spray Commonly known as: ATROVENT Place 2 sprays into both nostrils every 12 (twelve) hours.   isosorbide mononitrate 30 MG 24 hr tablet Commonly known as: IMDUR TAKE 2 TABLETS IN THE AM AND 1 TABLET IN THE PM.   lamoTRIgine 150 MG tablet Commonly known as: LAMICTAL TAKE 1 TABLET EACH DAY.   lubiprostone 24 MCG capsule Commonly known as: Amitiza Take 1 capsule (24 mcg total) by mouth 2 (two) times daily with a meal.   metoprolol succinate 50 MG 24 hr tablet Commonly known as: TOPROL-XL TAKE (1) TABLET DAILY IN THE MORNING. What changed:   how much to take  how to take this  when to take this  additional instructions   metroNIDAZOLE 0.75 % gel Commonly known as: METROGEL Apply 1 application topically 2 (two) times daily.   multivitamin with minerals Tabs tablet Take 1 tablet by mouth daily.   nitroGLYCERIN 0.4 MG SL tablet Commonly known as: NITROSTAT Place 1 tablet (0.4 mg total) under the tongue every 5 (five) minutes as needed for chest pain (x 3 doses).   ondansetron 8 MG tablet Commonly known as: Zofran Take 1 tablet (8 mg total) by mouth every 8 (eight) hours as needed for nausea or vomiting.   oxyCODONE-acetaminophen 10-325 MG tablet Commonly known as: PERCOCET TAKE (1) TABLET EVERY SIX HOURS AS NEEDED FOR PAIN.    pantoprazole 40 MG tablet Commonly known as: PROTONIX TAKE 1 TABLET BY MOUTH DAILY.   predniSONE 10 MG tablet Commonly known as: DELTASONE 50 mg PO x 1 day then 40 mg PO x 1 day then 30 mg PO x 1 day then 20 mg PO x 1 day then 10mg  PO x 1 day and stop Start taking on: January 23, 2020   rosuvastatin 20 MG tablet Commonly known as: CRESTOR Take 1 tablet (20 mg total) by mouth daily.   solifenacin 5 MG tablet Commonly known as: VESICARE TAKE 1 TABLET ONCE DAILY.   traZODone 50 MG tablet Commonly known as: DESYREL TAKE 2-4 TABLETS AT BEDTIME   Vitamin D (Ergocalciferol) 1.25 MG (50000 UNIT) Caps capsule Commonly known as: DRISDOL TAKE (1) CAPSULE TWICE A WEEK. What changed: See the new instructions.         Time coordinating discharge: 35 min  Signed:  Geradine Girt DO  [facility] 01/22/2020, 10:51 AM

## 2020-01-22 NOTE — Discharge Instructions (Signed)
Angioedema  Angioedema is sudden swelling in the body. The swelling can happen in any part of the body. It often happens on the skin and causes itchy, bumpy patches (hives) to form. This condition may:  Happen only one time.  Happen more than one time. It may come back at random times.  Keep coming back for a number of years. Someday it may stop coming back. Follow these instructions at home:  Take over-the-counter and prescription medicines only as told by your doctor.  If you were given medicines for emergency allergy treatment, always carry them with you.  Wear a medical bracelet as told by your doctor.  Avoid the things that cause your attacks (triggers).  If this condition was passed to you from your parents and you want to have kids, talk to your doctor. Your kids may also have this condition. Contact a doctor if:  You have another attack.  Your attacks happen more often, even after you take steps to prevent them.  This condition was passed to you by your parents and you want to have kids. Get help right away if:  Your mouth, tongue, or lips get very swollen.  You have trouble breathing.  You have trouble swallowing.  You pass out (faint). This information is not intended to replace advice given to you by your health care provider. Make sure you discuss any questions you have with your health care provider. Document Revised: 11/21/2017 Document Reviewed: 06/18/2016 Elsevier Patient Education  2020 Elsevier Inc.  

## 2020-01-23 LAB — C4 COMPLEMENT: Complement C4, Body Fluid: 37 mg/dL (ref 12–38)

## 2020-01-24 ENCOUNTER — Telehealth: Payer: Self-pay

## 2020-01-24 NOTE — Telephone Encounter (Signed)
Noted: nurse phone contact with patient for TCM. Signed:  Crissie Sickles, MD           01/24/2020

## 2020-01-24 NOTE — Telephone Encounter (Signed)
Transition Care Management Follow-up Telephone Call   Date admitted: 01/20/2020-01/22/2020  Diagnosis: Angioedema, slurred speech    How have you been since you were released from the hospital? "Speaking is better" Patient with slurred/slow speech while on call, which she states is an improvement. Declines speech therapy. Negative head CT and brain MRI. Discharged with Prednisone and Epi-Pen.    Do you understand why you were in the hospital? yes   Do you understand the discharge instructions? yes   Where were you discharged to? Home.    Items Reviewed:  Medications reviewed: yes  Allergies reviewed: yes  Dietary changes reviewed: yes  Referrals reviewed: yes, referral for allergist.    Functional Questionnaire:   Activities of Daily Living (ADLs):   She states they are independent in the following: ambulation, bathing and hygiene, feeding, continence, grooming, toileting and dressing States they require assistance with the following: None.    Any transportation issues/concerns?: no   Any patient concerns? no   Confirmed importance and date/time of follow-up visits scheduled yes  Provider Appointment booked with PCP on Monday, 01/31/2020 @ 2:30, virtually.   Confirmed with patient if condition begins to worsen call PCP or go to the ER.  Patient was given the office number and encouraged to call back with question or concerns.  : yes

## 2020-01-26 ENCOUNTER — Other Ambulatory Visit: Payer: Self-pay | Admitting: *Deleted

## 2020-01-26 NOTE — Patient Outreach (Signed)
Telephone follow up for a red flag on an EMMI discharge call: What to do if sxs reoccur.  Left message to return call to answer her questions.  Will call again tomorrow.  [name]. [name], MSN, [facility] Gerontological Nurse Practitioner [facility] Care Management [phone]

## 2020-01-27 ENCOUNTER — Ambulatory Visit: Payer: Self-pay | Admitting: *Deleted

## 2020-01-28 ENCOUNTER — Other Ambulatory Visit: Payer: Self-pay | Admitting: *Deleted

## 2020-01-28 NOTE — Patient Outreach (Signed)
Telephone outreach for red flag on EMMI #2.  Called and there was no answer, left message for a return call.  [name]. [name], MSN, [facility] Gerontological Nurse Practitioner [facility] Care Management [phone]

## 2020-01-31 ENCOUNTER — Encounter: Payer: Self-pay | Admitting: Family Medicine

## 2020-01-31 ENCOUNTER — Other Ambulatory Visit: Payer: Self-pay

## 2020-01-31 ENCOUNTER — Ambulatory Visit (INDEPENDENT_AMBULATORY_CARE_PROVIDER_SITE_OTHER): Payer: Medicare Other | Admitting: Family Medicine

## 2020-01-31 VITALS — BP 140/75 | HR 70

## 2020-01-31 DIAGNOSIS — I2699 Other pulmonary embolism without acute cor pulmonale: Secondary | ICD-10-CM | POA: Diagnosis not present

## 2020-01-31 DIAGNOSIS — T783XXD Angioneurotic edema, subsequent encounter: Secondary | ICD-10-CM

## 2020-01-31 DIAGNOSIS — E559 Vitamin D deficiency, unspecified: Secondary | ICD-10-CM

## 2020-01-31 DIAGNOSIS — J9611 Chronic respiratory failure with hypoxia: Secondary | ICD-10-CM | POA: Diagnosis not present

## 2020-01-31 DIAGNOSIS — I5032 Chronic diastolic (congestive) heart failure: Secondary | ICD-10-CM | POA: Diagnosis not present

## 2020-01-31 DIAGNOSIS — Z79899 Other long term (current) drug therapy: Secondary | ICD-10-CM | POA: Diagnosis not present

## 2020-01-31 DIAGNOSIS — G4733 Obstructive sleep apnea (adult) (pediatric): Secondary | ICD-10-CM | POA: Diagnosis not present

## 2020-01-31 DIAGNOSIS — J984 Other disorders of lung: Secondary | ICD-10-CM | POA: Diagnosis not present

## 2020-01-31 MED ORDER — DONEPEZIL HCL 10 MG PO TABS: 10.0000 mg | ORAL_TABLET | Freq: Every day | ORAL | 3 refills | Status: DC

## 2020-01-31 NOTE — Progress Notes (Signed)
Virtual Visit via Video Note  I connected with pt on 01/31/20 at  2:30 PM EST by a video enabled telemedicine application and verified that I am speaking with the correct person using two identifiers.  Location patient: home Location provider:work or home office Persons participating in the virtual visit: patient, provider  I discussed the limitations of evaluation and management by telemedicine and the availability of in person appointments. The patient expressed understanding and agreed to proceed.  Telemedicine visit is a necessity given the COVID-19 restrictions in place at the current time.  HPI: 74 y/o WF being seen today for hospital follow up. Admitted 1/28-1/30, 2021. Reason for admission: angioedema, unknown cause. D/c'd to her independent living facility, has been home 9 days now. I reviewed all ED and inpatient data. CT and MR imaging of brain all NEG ACUTE. Neurology saw her for question of facial droop along with slurred speech->no CVA.  Speech issues coming from swelling of tongue/lips.  Also, chronic home meds with sedative properties contributing. No etiology to pt's angioedema could be found.  She was referred to allergist.  Interim hx: Went home on prednisone x 5d, was provided with epi-pens. Speech is improved.  She no longer has any tongue/lips swelling.  No rash.  No cough, wheeze, or SOB. She has appt with allergist tomorrow for allergy testing. We reviewed her med list in detail today and she insists that her meds do not make her drowsy or impaired. When I brought up the possibility of cutting back on the strength of any of her meds (such as trazodone, cymbalta, alprazolam, aricept, lamictal) or d/c'ing any of them she stated she would not be very comfortable with this b/c these meds help her a lot. No new complaints.  ROS: no CP, no SOB, no wheezing, no cough, no dizziness, no HAs, no rashes, no melena/hematochezia.  No polyuria or polydipsia.  Chronic diffuse  myalgias and arthralgias.  Past Medical History:  Diagnosis Date  . Acute upper GI bleed 06/2014   while pt taking coumadin, plavix, and meloxicam---despite being told not to take coumadin.  . Anginal pain ([place])    Nonobstructive CAD 2014; however, her cardiologist put her on a statin for this and NOT for hyperlipidemia per pt report.  Atyp CP 08/2017 at card f/u, plan for myoc perf imaging.  . Anxiety    panic attacks  . Asthma    w/ asbestososis   . BPPV (benign paroxysmal positional vertigo) 12/16/2012  . Chronic diastolic CHF (congestive heart failure) (HCC)    dry wt as of 11/06/16 is 168 lbs.  . Chronic lower back pain   . COPD (chronic obstructive pulmonary disease) ([place])   . DDD (degenerative disc disease)    lumbar and cervical.   . Diverticular disease   . Fibromyalgia    Patient states dx was around her late 72s but she had sx's for years prior to this.  . H/O hiatal hernia   . Hay fever   . History of pneumonia    hospitalized 12/2011, 02/2013, and 07/2013 [facility]) for this  . HTN (hypertension)    Renal artery dopplers 04/2013 neg for stenosis.  . Hypervitaminosis D 09/27/2019   over-supplemented.  Stopped vit D and plan recheck 2 mo.  . Idiopathic angio-edema-urticaria 72014   Angioedema component was very minimal  . Insomnia   . Iron deficiency anemia    Hematologist in [place], [place] did extensive w/u; no cause found; failed oral supplement;; gets fairly regular (q36m  or so) IV iron infusions (Venofer -iron sucrose- 200mg  with procrit.  "for 14 yr I've been getting blood work q month & getting infusions prn" (07/12/2013).  Dr. Marin Olp locally, iron infusions done, EPO deficiency dx'd  . Migraine syndrome    "not as often anymore; used to be ~ q wk" (07/12/2013)  . Mixed incontinence urge and stress   . Nephrolithiasis    "passed all on my own or they are still in there" (07/12/2013)  . Neuroleptic induced Parkinsonism ([place]) 2018   Dr. Carles Collet, neuro, saw her 11/24/17 and  recommended d/c of abilify as first step.  D/c'd abilify and pt got complete recovery.  . OSA on CPAP    prior to move to [place]--had another sleep study 10/2015 w/pulm Dr. Camillo Flaming.  . Osteoarthritis    "severe; progressing fast" (07/12/2013); multiple joints-not surgical candidate for TKR (03/2015).  Triamcinolon knee injections by Dr. Tessa Lerner 12/2017.  [name] Kitchen Pernicious anemia 08/24/2014  . Pleural plaque with presence of asbestos 07/22/2013  . Pulmonary embolism ([place]) 07/2013   Dx at [facility] with very small peripheral upper lobe pe 07/2013: pt took coumadin for about 8-9 mo  . Pyelonephritis    "several times over the last 30 yr" (07/12/2013)  . RBBB (right bundle branch block)   . Recurrent major depression ([place])   . Recurrent UTI    hx of hospitalization for pyelonephritis; started abx prophylaxis 06/2015  . Syncope    Hypotensive; ED visit--Dr. Terrence Dupont did Cath--nonobstructive CAD, EF 55-60%.  In retrospect, suspect pt rx med misuse/polypharmacy  . Tension headache, chronic     Past Surgical History:  Procedure Laterality Date  . APPENDECTOMY  1960  . AXILLARY SURGERY Left 1978   Multiple "lump" in armpit per pt  . CARDIAC CATHETERIZATION  01/2013   nonobstructive CAD, EF 55-60%  . CARDIOVASCULAR STRESS TEST  02/22/15   Low risk myocard perf imaging; wall motion normal, normal EF  . carotid duplex doppler  10/21/2017   R vertebral flow suggestive of possible distal obstruction.  Pt declined further w/u as of 10/29/17 but need to revisit this problem periodically.  . COCCYX REMOVAL  1972  . DEXA  06/05/2017   T-score -3.1  . DILATION AND CURETTAGE OF UTERUS  ? 1970's  . ESOPHAGOGASTRODUODENOSCOPY N/A 07/19/2014   Gastritis found + in the setting of supratherapeutic INR, +plavix, + meloxicam.  . EYE SURGERY Left 2012-2013   "injections for ~ 1 yr; don't really know what for" (07/12/2013)  . HEEL SPUR SURGERY Left 2008  . KNEE SURGERY  2005  . LEFT HEART CATHETERIZATION WITH CORONARY ANGIOGRAM N/A  01/30/2013   Procedure: LEFT HEART CATHETERIZATION WITH CORONARY ANGIOGRAM;  Surgeon: Clent Demark, MD;  Location: [facility] CATH LAB;  Service: Cardiovascular;  Laterality: N/A;  . PLANTAR FASCIA RELEASE Left 2008  . SPIROMETRY  04/25/14   In hosp for acute asthma/COPD flare: mixed obstructive and restrictive lung disease. The FEV1 is severely reduced at 45% predicted.  FEV1 signif decreased compared to prior spirometry 07/23/13.  . TENDON RELEASE  1996   Right forearm and hand  . TOTAL ABDOMINAL HYSTERECTOMY  1974  . TRANSTHORACIC ECHOCARDIOGRAM  01/2013; 04/2014;08/2015; 09/2017   2014--NORMAL.  2015--focal basal septal hypertrophy, EF 55-60%, grade I diast dysfxn, mild LAE.  08/2015 EF 55-60%, nl LV syst fxn, grade I DD, valves wnl. 10/21/17: EF 65-70%, grd I DD, o/w normal.    Family History  Problem Relation Age of Onset  . Arthritis Mother   .  Kidney disease Mother   . Heart disease Father   . Stroke Father   . Hypertension Father   . Diabetes Father   . Heart attack Father   . Heart attack Paternal Grandmother   . Diabetes Sister        one sister  . Hypertension Sister   . Hypertension Brother   . Multiple sclerosis Son      Current Outpatient Medications:  .  albuterol (VENTOLIN HFA) 108 (90 Base) MCG/ACT inhaler, Inhale 1-2 puffs into the lungs every 6 (six) hours as needed for wheezing or shortness of breath., Disp: 18 g, Rfl: 1 .  alendronate (FOSAMAX) 70 MG tablet, Take 1 tablet (70 mg total) by mouth every 7 (seven) days. Take with a full glass of water on an empty stomach, first thing in the morning and remain up right for 30 minutes after taking., Disp: 12 tablet, Rfl: 3 .  ALPRAZolam (XANAX) 1 MG tablet, TAKE 1 TABLET THREE TIMES DAILY AS NEEDED FOR ANXIETY. (Patient taking differently: Take 1 mg by mouth 3 (three) times daily as needed for anxiety. ), Disp: 90 tablet, Rfl: 5 .  aspirin 81 MG tablet, Take 81 mg by mouth at bedtime., Disp: , Rfl:  .  diclofenac sodium (VOLTAREN)  1 % GEL, Apply 2 g topically 4 (four) times daily., Disp: 350 g, Rfl: 4 .  donepezil (ARICEPT) 10 MG tablet, Take 1 tablet (10 mg total) by mouth at bedtime., Disp: 30 tablet, Rfl: 6 .  DULoxetine (CYMBALTA) 30 MG capsule, TAKE 1 CAPSULE ONCE DAILY ALONG WITH 60MG  CAPSULE FOR TOTAL 90MG  DAILY., Disp: 90 capsule, Rfl: 0 .  DULoxetine (CYMBALTA) 60 MG capsule, TAKE 1 CAPSULE A DAY TO BE COMBINED WITH 30MG  CAPSULE (Patient taking differently: Take 60 mg by mouth daily. Take with 30mg  to total 90mg  daily.), Disp: 90 capsule, Rfl: 3 .  fluticasone (FLONASE) 50 MCG/ACT nasal spray, Place 2 sprays into both nostrils daily., Disp: 16 g, Rfl: 11 .  isosorbide mononitrate (IMDUR) 30 MG 24 hr tablet, TAKE 2 TABLETS IN THE AM AND 1 TABLET IN THE PM., Disp: 90 tablet, Rfl: 3 .  lamoTRIgine (LAMICTAL) 150 MG tablet, TAKE 1 TABLET EACH DAY., Disp: 90 tablet, Rfl: 1 .  metoprolol succinate (TOPROL-XL) 50 MG 24 hr tablet, TAKE (1) TABLET DAILY IN THE MORNING. (Patient taking differently: Take 50 mg by mouth daily. ), Disp: 90 tablet, Rfl: 3 .  metroNIDAZOLE (METROGEL) 0.75 % gel, Apply 1 application topically 2 (two) times daily., Disp: 45 g, Rfl: 3 .  Multiple Vitamin (MULTIVITAMIN WITH MINERALS) TABS tablet, Take 1 tablet by mouth daily., Disp: , Rfl:  .  nitroGLYCERIN (NITROSTAT) 0.4 MG SL tablet, Place 1 tablet (0.4 mg total) under the tongue every 5 (five) minutes as needed for chest pain (x 3 doses)., Disp: 25 tablet, Rfl: 11 .  ondansetron (ZOFRAN) 8 MG tablet, Take 1 tablet (8 mg total) by mouth every 8 (eight) hours as needed for nausea or vomiting., Disp: 30 tablet, Rfl: 6 .  oxyCODONE-acetaminophen (PERCOCET) 10-325 MG tablet, TAKE (1) TABLET EVERY SIX HOURS AS NEEDED FOR PAIN., Disp: 100 tablet, Rfl: 0 .  pantoprazole (PROTONIX) 40 MG tablet, TAKE 1 TABLET BY MOUTH DAILY., Disp: 90 tablet, Rfl: 3 .  solifenacin (VESICARE) 5 MG tablet, TAKE 1 TABLET ONCE DAILY., Disp: 30 tablet, Rfl: 1 .  traZODone  (DESYREL) 50 MG tablet, TAKE 2-4 TABLETS AT BEDTIME, Disp: 120 tablet, Rfl: 5 .  EPINEPHrine 0.3 mg/0.3  mL IJ SOAJ injection, Inject 0.3 mLs (0.3 mg total) into the muscle as needed for anaphylaxis. (Patient not taking: Reported on 01/31/2020), Disp: 1 each, Rfl: 1 .  fluconazole (DIFLUCAN) 100 MG tablet, 1 tab po qd prn thrush (Patient not taking: Reported on 01/31/2020), Disp: 60 tablet, Rfl: 1 .  furosemide (LASIX) 40 MG tablet, Take 1 tablet (40 mg total) by mouth daily as needed for fluid. (Patient not taking: Reported on 01/31/2020), Disp: , Rfl:  .  ipratropium (ATROVENT) 0.03 % nasal spray, Place 2 sprays into both nostrils every 12 (twelve) hours. (Patient not taking: Reported on 01/31/2020), Disp: 30 mL, Rfl: 11 .  lubiprostone (AMITIZA) 24 MCG capsule, Take 1 capsule (24 mcg total) by mouth 2 (two) times daily with a meal. (Patient not taking: Reported on 01/31/2020), Disp: 60 capsule, Rfl: 1 .  rosuvastatin (CRESTOR) 20 MG tablet, Take 1 tablet (20 mg total) by mouth daily., Disp: 90 tablet, Rfl: 3 .  Vitamin D, Ergocalciferol, (DRISDOL) 1.25 MG (50000 UT) CAPS capsule, TAKE (1) CAPSULE TWICE A WEEK. (Patient not taking: No sig reported), Disp: 24 capsule, Rfl: 0 No current facility-administered medications for this visit.  Facility-Administered Medications Ordered in Other Visits:  .  ferumoxytol (FERAHEME) 510 mg in sodium chloride 0.9 % 100 mL IVPB, 510 mg, Intravenous, Once, Ennever, Rudell Cobb, MD  EXAM:  VITALS per patient if applicable: BP [id number] (BP Location: Left Arm, Patient Position: Sitting, Cuff Size: Normal)   Pulse 70    GENERAL: alert, oriented, appears well and in no acute distress  HEENT: atraumatic, conjunttiva clear, no obvious abnormalities on inspection of external nose and ears  NECK: normal movements of the head and neck  LUNGS: on inspection no signs of respiratory distress, breathing rate appears normal, no obvious gross SOB, gasping or wheezing  CV: no obvious  cyanosis  MS: moves all visible extremities without noticeable abnormality  PSYCH/NEURO: pleasant and cooperative, no obvious depression or anxiety, speech and thought processing grossly intact  LABS: none today  COVID 19 NEG 01/20/20.  Lab Results  Component Value Date   TSH 2.750 10/04/2019   Lab Results  Component Value Date   WBC 8.5 01/20/2020   HGB 14.3 01/20/2020   HCT 42.0 01/20/2020   MCV 91.4 01/20/2020   PLT 250 01/20/2020   Lab Results  Component Value Date   CREATININE 0.80 01/20/2020   BUN 14 01/20/2020   NA 137 01/20/2020   K 3.7 01/20/2020   CL 105 01/20/2020   CO2 20 (L) 01/20/2020   Lab Results  Component Value Date   ALT 15 01/20/2020   AST 21 01/20/2020   ALKPHOS 62 01/20/2020   BILITOT 0.5 01/20/2020   Lab Results  Component Value Date   CHOL 172 09/22/2018   Lab Results  Component Value Date   HDL 43.70 09/22/2018   Lab Results  Component Value Date   LDLCALC 92 09/22/2018   Lab Results  Component Value Date   TRIG 184.0 (H) 09/22/2018   Lab Results  Component Value Date   CHOLHDL 4 09/22/2018   Lab Results  Component Value Date   HGBA1C 5.6 01/02/2016   ASSESSMENT AND PLAN:  Discussed the following assessment and plan:  1) Angioedema: resolved. Unknown etiology. No culprit meds. Pt to get allergist eval tomorrow. She has epi-pen on hand.  2) Polypharmacy: this has always been a concern with her. No changes at this time.  3) Vit D def: we rechecked her Vit  D level when she had been off her 50K weekly vit D dosing regimen:  It was at ULN level at that time so I told her not to resume the vit D. We'll recheck this at future visit (3 mo).  Continue OFF of vit D for now.   I discussed the assessment and treatment plan with the patient. The patient was provided an opportunity to ask questions and all were answered. The patient agreed with the plan and demonstrated an understanding of the instructions.   The patient was  advised to call back or seek an in-person evaluation if the symptoms worsen or if the condition fails to improve as anticipated.  F/u: 3 mo  Signed:  Crissie Sickles, MD           01/31/2020

## 2020-02-01 ENCOUNTER — Encounter: Payer: Self-pay | Admitting: Allergy and Immunology

## 2020-02-01 ENCOUNTER — Ambulatory Visit (INDEPENDENT_AMBULATORY_CARE_PROVIDER_SITE_OTHER): Payer: Medicare Other | Admitting: Allergy and Immunology

## 2020-02-01 VITALS — BP 130/78 | HR 88 | Temp 97.2°F | Resp 16 | Ht 64.0 in | Wt 149.8 lb

## 2020-02-01 DIAGNOSIS — T7840XA Allergy, unspecified, initial encounter: Secondary | ICD-10-CM | POA: Insufficient documentation

## 2020-02-01 DIAGNOSIS — T7840XD Allergy, unspecified, subsequent encounter: Secondary | ICD-10-CM | POA: Diagnosis not present

## 2020-02-01 DIAGNOSIS — T783XXD Angioneurotic edema, subsequent encounter: Secondary | ICD-10-CM

## 2020-02-01 DIAGNOSIS — J984 Other disorders of lung: Secondary | ICD-10-CM

## 2020-02-01 NOTE — Assessment & Plan Note (Addendum)
The patient's history suggests angioedema vs. anaphylactic reaction.  Given the timing of the event, we did not proceed with skin testing today due to the possibility of false negatives in the event of post-anaphylactic refractory period.  The following labs have been ordered: baseline serum tryptase, C4, C1 esterase inhibitor (quantitative and functional), C1q, factor XII, CBC, CMP, and galactose-alpha-1,3-galactose IgE level.  As she has just come off of a course of prednisone, we have asked that she delay having these labs drawn for 7 to 10 days.  The patient will be notified with further recommendations after lab results have returned.  When lab results have returned the patient will be called with further recommendations.  If lab work is unrevealing, the patient will return for further evaluation after the post-anaphylactic refractory period has ended.  Should symptoms recur, a journal is to be kept recording any foods eaten, beverages consumed, and medications taken within a 6 hour time period prior to the onset of symptoms, as well as record activities being performed, and environmental conditions. For any symptoms concerning for anaphylaxis, epinephrine is to be administered and 911 is to be called immediately.

## 2020-02-01 NOTE — Progress Notes (Signed)
New Patient Note  RE: Linda [name] MRN: [id number] DOB: [birth year] Date of Office Visit: 02/01/2020  Referring provider: Geradine Girt, DO Primary care provider: Tammi Sou, MD  Chief Complaint: Allergic Reaction and Angioedema   History of present illness: Linda [name] is a 74 y.o. female seen today in consultation requested by Eulogio Bear, DO.  She reports that on January 20, 2020 around 4:00 in the afternoon she was sitting at a table in the dining hall of her care facility and her friends noticed that she had slurred speech, was "speaking gibberish", and had "facial droop."  She noticed the sensation of tongue swelling which she reports progressed to the point that she "could not put it in (her) mouth".  She reports that the swelling progressed to her throat causing difficulty with swallowing. EMS was called and she was taken to [facility] emergency department.  Neurology team was at the ready to rule out CVA.  Head CT and head MRI revealed no acute changes.  She received IM epinephrine x2 and "massive doses of steroids."  She reports that the swelling of the tongue and throat gradually decreased over the next 2 days.  She is not and was not on an ACE inhibitor.  She has no known family history of angioedema.  Symptom onset began at approximately 4:15 PM. She had eaten cinnamon toast with raisins and orange marmalade along with coffee for breakfast and had skipped lunch.  She denies new medications or unusual environmental exposures during the day.  She does not recall having been bitten or stung by an insect. She reports that approximately 15 years ago she was planting pine trees on her property and experienced eyelid swelling and facial swelling.  The ER physician at that time suspected an allergic reaction to insect sting or bite, however she denies having been bitten or stung.   The patient reports that she has asbestosis and is followed by a pulmonologist for  restrictive lung disease.  She reports that she requires albuterol rescue, with benefit, 1-2 times per week on average.  Assessment and plan: Allergic reaction The patient's history suggests angioedema vs. anaphylactic reaction.  Given the timing of the event, we did not proceed with skin testing today due to the possibility of false negatives in the event of post-anaphylactic refractory period.  The following labs have been ordered: baseline serum tryptase, C4, C1 esterase inhibitor (quantitative and functional), C1q, factor XII, CBC, CMP, and galactose-alpha-1,3-galactose IgE level.  As she has just come off of a course of prednisone, we have asked that she delay having these labs drawn for 7 to 10 days.  The patient will be notified with further recommendations after lab results have returned.  When lab results have returned the patient will be called with further recommendations.  If lab work is unrevealing, the patient will return for further evaluation after the post-anaphylactic refractory period has ended.  Should symptoms recur, a journal is to be kept recording any foods eaten, beverages consumed, and medications taken within a 6 hour time period prior to the onset of symptoms, as well as record activities being performed, and environmental conditions. For any symptoms concerning for anaphylaxis, epinephrine is to be administered and 911 is to be called immediately.   Diagnostics: Spirometry: Spirometry reveals an FVC of 1.15 L (40% predicted) and an FEV1 of 0.92 L (42% predicted).  Severe restriction.  Please see scanned spirometry results for details. Allergy skin testing: Skin testing was  deferred because of the possibility of post anaphylactic refractory period.      Physical examination: Blood pressure 130/78, pulse 88, temperature (!) 97.2 F (36.2 C), temperature source Temporal, resp. rate 16, height 5\' 4"  (1.626 m), weight 149 lb 12 oz (67.9 kg), SpO2 94 %.  General:  Alert, interactive, in no acute distress. HEENT: TMs pearly gray, turbinates minimally edematous without discharge, post-pharynx unremarkable. Neck: Supple without lymphadenopathy. Lungs: Mildly decreased breath sounds bilaterally without wheezing, rhonchi or rales. CV: Normal S1, S2 without murmurs. Abdomen: Nondistended, nontender. Skin: Warm and dry, without lesions or rashes. Extremities:  No clubbing, cyanosis or edema. Neuro:   Grossly intact.  Review of systems:  Review of systems negative except as noted in HPI / PMHx or noted below: Review of Systems  Constitutional: Negative.   HENT: Negative.   Eyes: Negative.   Respiratory: Negative.   Cardiovascular: Negative.   Gastrointestinal: Negative.   Genitourinary: Negative.   Musculoskeletal: Negative.   Skin: Negative.   Neurological: Negative.   Endo/Heme/Allergies: Negative.   Psychiatric/Behavioral: Negative.     Past medical history:  Past Medical History:  Diagnosis Date  . Acute upper GI bleed 06/2014   while pt taking coumadin, plavix, and meloxicam---despite being told not to take coumadin.  . Anginal pain ([place])    Nonobstructive CAD 2014; however, her cardiologist put her on a statin for this and NOT for hyperlipidemia per pt report.  Atyp CP 08/2017 at card f/u, plan for myoc perf imaging.  . Anxiety    panic attacks  . Asthma    w/ asbestososis   . BPPV (benign paroxysmal positional vertigo) 12/16/2012  . Chronic diastolic CHF (congestive heart failure) (HCC)    dry wt as of 11/06/16 is 168 lbs.  . Chronic lower back pain   . COPD (chronic obstructive pulmonary disease) ([place])   . DDD (degenerative disc disease)    lumbar and cervical.   . Diverticular disease   . Fibromyalgia    Patient states dx was around her late 21s but she had sx's for years prior to this.  . H/O hiatal hernia   . Hay fever   . History of pneumonia    hospitalized 12/2011, 02/2013, and 07/2013 [facility]) for this  . HTN (hypertension)     Renal artery dopplers 04/2013 neg for stenosis.  . Hypervitaminosis D 09/27/2019   over-supplemented.  Stopped vit D and plan recheck 2 mo.  . Idiopathic angio-edema-urticaria 72014   Angioedema component was very minimal  . Insomnia   . Iron deficiency anemia    Hematologist in [place], [place] did extensive w/u; no cause found; failed oral supplement;; gets fairly regular (q71m or so) IV iron infusions (Venofer -iron sucrose- 200mg  with procrit.  "for 14 yr I've been getting blood work q month & getting infusions prn" (07/12/2013).  Dr. Marin Olp locally, iron infusions done, EPO deficiency dx'd  . Migraine syndrome    "not as often anymore; used to be ~ q wk" (07/12/2013)  . Mixed incontinence urge and stress   . Nephrolithiasis    "passed all on my own or they are still in there" (07/12/2013)  . Neuroleptic induced Parkinsonism ([place]) 2018   Dr. Carles Collet, neuro, saw her 11/24/17 and recommended d/c of abilify as first step.  D/c'd abilify and pt got complete recovery.  . OSA on CPAP    prior to move to [place]--had another sleep study 10/2015 w/pulm Dr. Camillo Flaming.  . Osteoarthritis    "severe; progressing  fast" (07/12/2013); multiple joints-not surgical candidate for TKR (03/2015).  Triamcinolon knee injections by Dr. Tessa Lerner 12/2017.  [name] Kitchen Pernicious anemia 08/24/2014  . Pleural plaque with presence of asbestos 07/22/2013  . Pulmonary embolism ([place]) 07/2013   Dx at [facility] with very small peripheral upper lobe pe 07/2013: pt took coumadin for about 8-9 mo  . Pyelonephritis    "several times over the last 30 yr" (07/12/2013)  . RBBB (right bundle branch block)   . Recurrent major depression ([place])   . Recurrent UTI    hx of hospitalization for pyelonephritis; started abx prophylaxis 06/2015  . Syncope    Hypotensive; ED visit--Dr. Terrence Dupont did Cath--nonobstructive CAD, EF 55-60%.  In retrospect, suspect pt rx med misuse/polypharmacy  . Tension headache, chronic     Past surgical history:  Past Surgical History:    Procedure Laterality Date  . APPENDECTOMY  1960  . AXILLARY SURGERY Left 1978   Multiple "lump" in armpit per pt  . CARDIAC CATHETERIZATION  01/2013   nonobstructive CAD, EF 55-60%  . CARDIOVASCULAR STRESS TEST  02/22/15   Low risk myocard perf imaging; wall motion normal, normal EF  . carotid duplex doppler  10/21/2017   R vertebral flow suggestive of possible distal obstruction.  Pt declined further w/u as of 10/29/17 but need to revisit this problem periodically.  . COCCYX REMOVAL  1972  . DEXA  06/05/2017   T-score -3.1  . DILATION AND CURETTAGE OF UTERUS  ? 1970's  . ESOPHAGOGASTRODUODENOSCOPY N/A 07/19/2014   Gastritis found + in the setting of supratherapeutic INR, +plavix, + meloxicam.  . EYE SURGERY Left 2012-2013   "injections for ~ 1 yr; don't really know what for" (07/12/2013)  . HEEL SPUR SURGERY Left 2008  . KNEE SURGERY  2005  . LEFT HEART CATHETERIZATION WITH CORONARY ANGIOGRAM N/A 01/30/2013   Procedure: LEFT HEART CATHETERIZATION WITH CORONARY ANGIOGRAM;  Surgeon: Clent Demark, MD;  Location: [facility] CATH LAB;  Service: Cardiovascular;  Laterality: N/A;  . PLANTAR FASCIA RELEASE Left 2008  . SPIROMETRY  04/25/14   In hosp for acute asthma/COPD flare: mixed obstructive and restrictive lung disease. The FEV1 is severely reduced at 45% predicted.  FEV1 signif decreased compared to prior spirometry 07/23/13.  . TENDON RELEASE  1996   Right forearm and hand  . TOTAL ABDOMINAL HYSTERECTOMY  1974  . TRANSTHORACIC ECHOCARDIOGRAM  01/2013; 04/2014;08/2015; 09/2017   2014--NORMAL.  2015--focal basal septal hypertrophy, EF 55-60%, grade I diast dysfxn, mild LAE.  08/2015 EF 55-60%, nl LV syst fxn, grade I DD, valves wnl. 10/21/17: EF 65-70%, grd I DD, o/w normal.    Family history: Family History  Problem Relation Age of Onset  . Arthritis Mother   . Kidney disease Mother   . Heart disease Father   . Stroke Father   . Hypertension Father   . Diabetes Father   . Heart attack Father    . Heart attack Paternal Grandmother   . Diabetes Sister        one sister  . Hypertension Sister   . Asthma Sister   . Hypertension Brother   . Asthma Brother   . Asthma Daughter   . Multiple sclerosis Son     Social history: Social History   Socioeconomic History  . Marital status: Widowed    Spouse name: Not on file  . Number of children: 2  . Years of education: Not on file  . Highest education level: Not on file  Occupational History  .  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 Smoking status: Never Smoker  . Smokeless tobacco: Never Used  . Tobacco comment: never used tobacco  Substance and Sexual Activity  . Alcohol use: No    Alcohol/week: 0.0 standard drinks  . Drug use: No  . Sexual activity: Not Currently  Other Topics Concern  . Not on file  Social History Narrative   Widowed, 2 sons.  Relocated to [place] 09/2012 to be closer to her son who has MS.   Husband d 2015--mesothelioma.   Occupation: former Pharmacist, hospital.   Education: masters degree level.   No T/A/Ds.   Lives in [place], independent living.   Social Determinants of Health   Financial Resource Strain:   . Difficulty of Paying Living Expenses: Not on file  Food Insecurity:   . Worried About Charity fundraiser in the Last Year: Not on file  . Ran Out of Food in the Last Year: Not on file  Transportation Needs:   . Lack of Transportation (Medical): Not on file  . Lack of Transportation (Non-Medical): Not on file  Physical Activity:   . Days of Exercise per Week: Not on file  . Minutes of Exercise per Session: Not on file  Stress:   . Feeling of Stress : Not on file  Social Connections:   . Frequency of Communication with Friends and Family: Not on file  . Frequency of Social Gatherings with Friends and Family: Not on file  . Attends Religious Services: Not on file  . Active Member of Clubs or Organizations: Not on file  . Attends Archivist Meetings:  Not on file  . Marital Status: Not on file  Intimate Partner Violence:   . Fear of Current or Ex-Partner: Not on file  . Emotionally Abused: Not on file  . Physically Abused: Not on file  . Sexually Abused: Not on file    Environmental History: The patient lives in an independent living community with carpeting in her home and central air/heat.  There is no known mold/water damage in the home.  There are no pets in the home.  She is a non-smoker.  Current Outpatient Medications  Medication Sig Dispense Refill  . albuterol (VENTOLIN HFA) 108 (90 Base) MCG/ACT inhaler Inhale 1-2 puffs into the lungs every 6 (six) hours as needed for wheezing or shortness of breath. 18 g 1  . alendronate (FOSAMAX) 70 MG tablet Take 1 tablet (70 mg total) by mouth every 7 (seven) days. Take with a full glass of water on an empty stomach, first thing in the morning and remain up right for 30 minutes after taking. 12 tablet 3  . ALPRAZolam (XANAX) 1 MG tablet TAKE 1 TABLET THREE TIMES DAILY AS NEEDED FOR ANXIETY. (Patient taking differently: Take 1 mg by mouth 3 (three) times daily as needed for anxiety. ) 90 tablet 5  . aspirin 81 MG tablet Take 81 mg by mouth at bedtime.    . diclofenac sodium (VOLTAREN) 1 % GEL Apply 2 g topically 4 (four) times daily. (Patient taking differently: Apply 2 g topically 4 (four) times daily as needed. ) 350 g 4  . donepezil (ARICEPT) 10 MG tablet Take 1 tablet (10 mg total) by mouth at bedtime. 90 tablet 3  . DULoxetine (CYMBALTA) 30 MG capsule TAKE 1 CAPSULE ONCE DAILY ALONG WITH 60MG  CAPSULE FOR TOTAL 90MG  DAILY. 90 capsule 0  . DULoxetine (CYMBALTA) 60 MG capsule  TAKE 1 CAPSULE A DAY TO BE COMBINED WITH 30MG  CAPSULE (Patient taking differently: Take 60 mg by mouth daily. Take with 30mg  to total 90mg  daily.) 90 capsule 3  . EPINEPHrine 0.3 mg/0.3 mL IJ SOAJ injection Inject 0.3 mLs (0.3 mg total) into the muscle as needed for anaphylaxis. 1 each 1  . fluconazole (DIFLUCAN) 100 MG  tablet 1 tab po qd prn thrush 60 tablet 1  . fluticasone (FLONASE) 50 MCG/ACT nasal spray Place 2 sprays into both nostrils daily. 16 g 11  . furosemide (LASIX) 40 MG tablet Take 1 tablet (40 mg total) by mouth daily as needed for fluid.    [name] Kitchen ipratropium (ATROVENT) 0.03 % nasal spray Place 2 sprays into both nostrils every 12 (twelve) hours. 30 mL 11  . isosorbide mononitrate (IMDUR) 30 MG 24 hr tablet TAKE 2 TABLETS IN THE AM AND 1 TABLET IN THE PM. 90 tablet 3  . lamoTRIgine (LAMICTAL) 150 MG tablet TAKE 1 TABLET EACH DAY. 90 tablet 1  . metoprolol succinate (TOPROL-XL) 50 MG 24 hr tablet TAKE (1) TABLET DAILY IN THE MORNING. (Patient taking differently: Take 50 mg by mouth daily. ) 90 tablet 3  . Multiple Vitamin (MULTIVITAMIN WITH MINERALS) TABS tablet Take 1 tablet by mouth daily.    . nitroGLYCERIN (NITROSTAT) 0.4 MG SL tablet Place 1 tablet (0.4 mg total) under the tongue every 5 (five) minutes as needed for chest pain (x 3 doses). 25 tablet 11  . ondansetron (ZOFRAN) 8 MG tablet Take 1 tablet (8 mg total) by mouth every 8 (eight) hours as needed for nausea or vomiting. 30 tablet 6  . oxyCODONE-acetaminophen (PERCOCET) 10-325 MG tablet TAKE (1) TABLET EVERY SIX HOURS AS NEEDED FOR PAIN. 100 tablet 0  . pantoprazole (PROTONIX) 40 MG tablet TAKE 1 TABLET BY MOUTH DAILY. 90 tablet 3  . solifenacin (VESICARE) 5 MG tablet TAKE 1 TABLET ONCE DAILY. (Patient taking differently: Take 10 mg by mouth daily. ) 30 tablet 1  . traZODone (DESYREL) 50 MG tablet TAKE 2-4 TABLETS AT BEDTIME 120 tablet 5  . lubiprostone (AMITIZA) 24 MCG capsule Take 1 capsule (24 mcg total) by mouth 2 (two) times daily with a meal. (Patient not taking: Reported on 01/31/2020) 60 capsule 1  . metroNIDAZOLE (METROGEL) 0.75 % gel Apply 1 application topically 2 (two) times daily. (Patient not taking: Reported on 02/01/2020) 45 g 3  . rosuvastatin (CRESTOR) 20 MG tablet Take 1 tablet (20 mg total) by mouth daily. 90 tablet 3  .  Vitamin D, Ergocalciferol, (DRISDOL) 1.25 MG (50000 UT) CAPS capsule TAKE (1) CAPSULE TWICE A WEEK. (Patient not taking: No sig reported) 24 capsule 0   No current facility-administered medications for this visit.   Facility-Administered Medications Ordered in Other Visits  Medication Dose Route Frequency Provider Last Rate Last Admin  . ferumoxytol (FERAHEME) 510 mg in sodium chloride 0.9 % 100 mL IVPB  510 mg Intravenous Once Volanda Napoleon, MD        Known medication allergies: Allergies  Allergen Reactions  . Abilify [Aripiprazole] Other (See Comments)    parkinsonism  . Penicillins Itching, Swelling and Rash    Tolerated Cefepime in ED. Has patient had a PCN reaction causing immediate rash, facial/tongue/throat swelling, SOB or lightheadedness with hypotension: Yes Has patient had a PCN reaction causing severe rash involving mucus membranes or skin necrosis: No Has patient had a PCN reaction that required hospitalization: No  Has patient had a PCN reaction occurring within the  last 10 years: No     I appreciate the opportunity to take part in [place] care. Please do not hesitate to contact me with questions.  Sincerely,   R. Edgar Frisk, MD

## 2020-02-01 NOTE — Patient Instructions (Addendum)
Allergic reaction The patient's history suggests angioedema vs. anaphylactic reaction.  Given the timing of the event, we did not proceed with skin testing today due to the possibility of false negatives in the event of post-anaphylactic refractory period.  The following labs have been ordered: baseline serum tryptase, C4, C1 esterase inhibitor (quantitative and functional), C1q, factor XII, CBC, CMP, and galactose-alpha-1,3-galactose IgE level.  As she has just come off of a course of prednisone, we have asked that she delay having these labs drawn for 7 to 10 days.  The patient will be notified with further recommendations after lab results have returned.  When lab results have returned the patient will be called with further recommendations.  If lab work is unrevealing, the patient will return for further evaluation after the post-anaphylactic refractory period has ended.  Should symptoms recur, a journal is to be kept recording any foods eaten, beverages consumed, and medications taken within a 6 hour time period prior to the onset of symptoms, as well as record activities being performed, and environmental conditions. For any symptoms concerning for anaphylaxis, epinephrine is to be administered and 911 is to be called immediately.   Return in about 4 weeks (around 02/29/2020) for allergy skin testing.

## 2020-02-02 ENCOUNTER — Telehealth: Payer: Self-pay | Admitting: Allergy and Immunology

## 2020-02-02 ENCOUNTER — Other Ambulatory Visit: Payer: Self-pay

## 2020-02-02 ENCOUNTER — Encounter: Payer: Medicare Other | Attending: Physical Medicine & Rehabilitation | Admitting: Registered Nurse

## 2020-02-02 ENCOUNTER — Telehealth: Payer: Self-pay

## 2020-02-02 ENCOUNTER — Encounter: Payer: Self-pay | Admitting: Registered Nurse

## 2020-02-02 VITALS — BP 138/70 | HR 85 | Ht 62.5 in | Wt 159.0 lb

## 2020-02-02 DIAGNOSIS — Z79899 Other long term (current) drug therapy: Secondary | ICD-10-CM | POA: Diagnosis not present

## 2020-02-02 DIAGNOSIS — M1711 Unilateral primary osteoarthritis, right knee: Secondary | ICD-10-CM

## 2020-02-02 DIAGNOSIS — M5416 Radiculopathy, lumbar region: Secondary | ICD-10-CM

## 2020-02-02 DIAGNOSIS — M1712 Unilateral primary osteoarthritis, left knee: Secondary | ICD-10-CM | POA: Diagnosis not present

## 2020-02-02 DIAGNOSIS — M7061 Trochanteric bursitis, right hip: Secondary | ICD-10-CM

## 2020-02-02 DIAGNOSIS — Z5181 Encounter for therapeutic drug level monitoring: Secondary | ICD-10-CM

## 2020-02-02 DIAGNOSIS — R2689 Other abnormalities of gait and mobility: Secondary | ICD-10-CM | POA: Insufficient documentation

## 2020-02-02 DIAGNOSIS — M797 Fibromyalgia: Secondary | ICD-10-CM | POA: Diagnosis not present

## 2020-02-02 DIAGNOSIS — G894 Chronic pain syndrome: Secondary | ICD-10-CM

## 2020-02-02 MED ORDER — OXYCODONE-ACETAMINOPHEN 10-325 MG PO TABS: ORAL_TABLET | ORAL | 0 refills | Status: DC

## 2020-02-02 NOTE — Telephone Encounter (Signed)
Patient called and said that she is coming in to get skin test on march 8. She gets [place] Injection in her knees every 4 month and she is schedule after that to get her injection. Wanted to know if she should worry about a reaction. [phone].

## 2020-02-02 NOTE — Progress Notes (Signed)
Subjective:    Patient ID: Linda [name], female    DOB: [birth year], 74 y.o.   MRN: [id number]  HPI: Linda [name] is a 74 y.o. female whose appointment was changed to a virtual office visit to reduce the risk of exposure to the COVID-19 virus and to help Linda [name] remain healthy and safe. The virtual visit will also provide continuity of care. Linda [name] agrees with virtual visit and verbalizes understanding. She states her pain is located in her lower back radiating into her right hip and right lower extremity. Also reports bilateral knee pain. She rates her pain 8. Her current exercise regime is walking.  Linda [name] was hospitalized at [facility] on 01/20/2020 and discharged on 01/22/2020 for slurred speech and facial droop. Discharge Summary was reviewed.    Linda [name] Morphine equivalent is 60.00  MME.  She  is also prescribed Alprazolam by [name] . We have discussed the black box warning of using opioids and benzodiazepines. I highlighted the dangers of using these drugs together and discussed the adverse events including respiratory suppression, overdose, cognitive impairment and importance of compliance with current regimen. We will continue to monitor and adjust as indicated.   Last UDS was Performed on 07/14/2019, it was consistent.     Pain Inventory Average Pain 8 Pain Right Now 8 My pain is constant, sharp, dull, stabbing and aching  In the last 24 hours, has pain interfered with the following? General activity 5 Relation with others 5 Enjoyment of life 5 What TIME of day is your pain at its worst? night Sleep (in general) Fair  Pain is worse with: walking, standing and some activites Pain improves with: rest, heat/ice and medication Relief from Meds: 8  Mobility use a walker ability to climb steps?  no do you drive?  yes  Function retired  Neuro/Psych weakness trouble walking  Prior Studies Any changes since last visit?   no  Physicians involved in your care Any changes since last visit?  no   Family History  Problem Relation Age of Onset  . Arthritis Mother   . Kidney disease Mother   . Heart disease Father   . Stroke Father   . Hypertension Father   . Diabetes Father   . Heart attack Father   . Heart attack Paternal Grandmother   . Diabetes Sister        one sister  . Hypertension Sister   . Asthma Sister   . Hypertension Brother   . Asthma Brother   . Asthma Daughter   . Multiple sclerosis Son    Social History   Socioeconomic History  . Marital status: Widowed    Spouse name: Not on file  . Number of children: 2  . Years of education: Not on file  . Highest education level: Not on file  Occupational History  . 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 Smoking status: Never Smoker  . Smokeless tobacco: Never Used  . Tobacco comment: never used tobacco  Substance and Sexual Activity  . Alcohol use: No    Alcohol/week: 0.0 standard drinks  . Drug use: No  . Sexual activity: Not Currently  Other Topics Concern  . Not on file  Social History Narrative   Widowed, 2 sons.  Relocated to [place] 09/2012 to be closer to her son who has MS.   Husband d 2015--mesothelioma.   Occupation: former Pharmacist, hospital.   Education:  masters degree level.   No T/A/Ds.   Lives in [place], independent living.   Social Determinants of Health   Financial Resource Strain:   . Difficulty of Paying Living Expenses: Not on file  Food Insecurity:   . Worried About Charity fundraiser in the Last Year: Not on file  . Ran Out of Food in the Last Year: Not on file  Transportation Needs:   . Lack of Transportation (Medical): Not on file  . Lack of Transportation (Non-Medical): Not on file  Physical Activity:   . Days of Exercise per Week: Not on file  . Minutes of Exercise per Session: Not on file  Stress:   . Feeling of Stress : Not on file  Social Connections:   . Frequency  of Communication with Friends and Family: Not on file  . Frequency of Social Gatherings with Friends and Family: Not on file  . Attends Religious Services: Not on file  . Active Member of Clubs or Organizations: Not on file  . Attends Archivist Meetings: Not on file  . Marital Status: Not on file   Past Surgical History:  Procedure Laterality Date  . APPENDECTOMY  1960  . AXILLARY SURGERY Left 1978   Multiple "lump" in armpit per pt  . CARDIAC CATHETERIZATION  01/2013   nonobstructive CAD, EF 55-60%  . CARDIOVASCULAR STRESS TEST  02/22/15   Low risk myocard perf imaging; wall motion normal, normal EF  . carotid duplex doppler  10/21/2017   R vertebral flow suggestive of possible distal obstruction.  Pt declined further w/u as of 10/29/17 but need to revisit this problem periodically.  . COCCYX REMOVAL  1972  . DEXA  06/05/2017   T-score -3.1  . DILATION AND CURETTAGE OF UTERUS  ? 1970's  . ESOPHAGOGASTRODUODENOSCOPY N/A 07/19/2014   Gastritis found + in the setting of supratherapeutic INR, +plavix, + meloxicam.  . EYE SURGERY Left 2012-2013   "injections for ~ 1 yr; don't really know what for" (07/12/2013)  . HEEL SPUR SURGERY Left 2008  . KNEE SURGERY  2005  . LEFT HEART CATHETERIZATION WITH CORONARY ANGIOGRAM N/A 01/30/2013   Procedure: LEFT HEART CATHETERIZATION WITH CORONARY ANGIOGRAM;  Surgeon: Clent Demark, MD;  Location: [facility] CATH LAB;  Service: Cardiovascular;  Laterality: N/A;  . PLANTAR FASCIA RELEASE Left 2008  . SPIROMETRY  04/25/14   In hosp for acute asthma/COPD flare: mixed obstructive and restrictive lung disease. The FEV1 is severely reduced at 45% predicted.  FEV1 signif decreased compared to prior spirometry 07/23/13.  . TENDON RELEASE  1996   Right forearm and hand  . TOTAL ABDOMINAL HYSTERECTOMY  1974  . TRANSTHORACIC ECHOCARDIOGRAM  01/2013; 04/2014;08/2015; 09/2017   2014--NORMAL.  2015--focal basal septal hypertrophy, EF 55-60%, grade I diast dysfxn, mild  LAE.  08/2015 EF 55-60%, nl LV syst fxn, grade I DD, valves wnl. 10/21/17: EF 65-70%, grd I DD, o/w normal.   Past Medical History:  Diagnosis Date  . Acute upper GI bleed 06/2014   while pt taking coumadin, plavix, and meloxicam---despite being told not to take coumadin.  . Anginal pain ([place])    Nonobstructive CAD 2014; however, her cardiologist put her on a statin for this and NOT for hyperlipidemia per pt report.  Atyp CP 08/2017 at card f/u, plan for myoc perf imaging.  . Anxiety    panic attacks  . Asthma    w/ asbestososis   . BPPV (benign paroxysmal positional vertigo) 12/16/2012  .  Chronic diastolic CHF (congestive heart failure) (HCC)    dry wt as of 11/06/16 is 168 lbs.  . Chronic lower back pain   . COPD (chronic obstructive pulmonary disease) ([place])   . DDD (degenerative disc disease)    lumbar and cervical.   . Diverticular disease   . Fibromyalgia    Patient states dx was around her late 84s but she had sx's for years prior to this.  . H/O hiatal hernia   . Hay fever   . History of pneumonia    hospitalized 12/2011, 02/2013, and 07/2013 [facility]) for this  . HTN (hypertension)    Renal artery dopplers 04/2013 neg for stenosis.  . Hypervitaminosis D 09/27/2019   over-supplemented.  Stopped vit D and plan recheck 2 mo.  . Idiopathic angio-edema-urticaria 72014   Angioedema component was very minimal  . Insomnia   . Iron deficiency anemia    Hematologist in [place], [place] did extensive w/u; no cause found; failed oral supplement;; gets fairly regular (q1m or so) IV iron infusions (Venofer -iron sucrose- 200mg  with procrit.  "for 14 yr I've been getting blood work q month & getting infusions prn" (07/12/2013).  Dr. Marin Olp locally, iron infusions done, EPO deficiency dx'd  . Migraine syndrome    "not as often anymore; used to be ~ q wk" (07/12/2013)  . Mixed incontinence urge and stress   . Nephrolithiasis    "passed all on my own or they are still in there" (07/12/2013)  .  Neuroleptic induced Parkinsonism ([place]) 2018   Dr. Carles Collet, neuro, saw her 11/24/17 and recommended d/c of abilify as first step.  D/c'd abilify and pt got complete recovery.  . OSA on CPAP    prior to move to [place]--had another sleep study 10/2015 w/pulm Dr. Camillo Flaming.  . Osteoarthritis    "severe; progressing fast" (07/12/2013); multiple joints-not surgical candidate for TKR (03/2015).  Triamcinolon knee injections by Dr. Tessa Lerner 12/2017.  [name] Kitchen Pernicious anemia 08/24/2014  . Pleural plaque with presence of asbestos 07/22/2013  . Pulmonary embolism ([place]) 07/2013   Dx at [facility] with very small peripheral upper lobe pe 07/2013: pt took coumadin for about 8-9 mo  . Pyelonephritis    "several times over the last 30 yr" (07/12/2013)  . RBBB (right bundle branch block)   . Recurrent major depression ([place])   . Recurrent UTI    hx of hospitalization for pyelonephritis; started abx prophylaxis 06/2015  . Syncope    Hypotensive; ED visit--Dr. Terrence Dupont did Cath--nonobstructive CAD, EF 55-60%.  In retrospect, suspect pt rx med misuse/polypharmacy  . Tension headache, chronic    There were no vitals taken for this visit.  Opioid Risk Score:   Fall Risk Score:  `1  Depression screen PHQ 2/9  Depression screen [facility] 2/9 09/27/2019 02/24/2019 12/25/2018 09/02/2018 07/01/2018 05/27/2018 05/19/2018  Decreased Interest 1 1 1 1 1 1  0  Down, Depressed, Hopeless 2 1 1 1 1 1  0  PHQ - 2 Score 3 2 2 2 2 2  0  Altered sleeping 1 - - - - 0 -  Tired, decreased energy 1 - - - - 1 -  Change in appetite 0 - - - - 0 -  Feeling bad or failure about yourself  0 - - - - 0 -  Trouble concentrating 1 - - - - 0 -  Moving slowly or fidgety/restless 0 - - - - 0 -  Suicidal thoughts 0 - - - - 0 -  PHQ-9 Score 6 - - - -  3 -  Difficult doing work/chores Not difficult at all - - - - Not difficult at all -  Some recent data might be hidden     Review of Systems  Constitutional: Negative.   HENT: Negative.   Eyes: Negative.   Respiratory: Negative.    Cardiovascular: Negative.   Gastrointestinal: Negative.   Endocrine: Negative.   Genitourinary: Negative.   Musculoskeletal: Positive for arthralgias and myalgias.  Skin: Negative.   Allergic/Immunologic: Negative.   Neurological: Positive for weakness and numbness.  Hematological: Negative.   Psychiatric/Behavioral: Negative.   All other systems reviewed and are negative.      Objective:   Physical Exam Vitals and nursing note reviewed.  Musculoskeletal:     Comments: No Physical Exam Performed: Virtual Visit           Assessment & Plan:  1. Functional deficits secondary to Gait disorder:Continue with HEP.02/02/2020. 2. Chronic Back pain/ Lumbar Radiculitis/fibromyalgia /R>L Knee OA Pain Management:02/02/2020 Refilled: Oxycodone 10/325mg one tablet every 6 hours #100. ContinueVoltaren Gel. We will continue the opioid monitoring program, this consists of regular clinic visits, examinations, urine drug screen, pill counts as well as use of [place] Controlled Substance reporting System. 3. Depression with anxiety/Grief reaction/Mood:02/02/2020 Continue Xanax,Trazodone and Cymbalta . PCP Following. 4. [name] with asthma:. Albuterol prn. :Pulmonology Following.02/02/2020. 5. Bilateral Osteoarthritis of Bilateral Knees: Ms. Strick Requesting Bilateral Zilretta Injection, she will speak with her allergist regarding the above.  S/PBilateralZilretta Injections on 10/28/2020with relief noted.Continue to Monitor.02/02/2020 6. Fibromyalgia: Continue Home exercise regimen as tolerated. Continue to Monitor.02/02/2020 7. Cervicalgia/ Cervical Radiculitis:No complaints today. Continue HEP as tolerated. Alternate with heat and ice therapy. Continue to monitor.02/01/2019 8.BilateralGreater Trochanteric Bursitis:Continue toAlternate Ice and Heat Therapy. Continue to Monitor.02/02/2020. 9. Chronic Bilateral Shoulder Pain:No complaints Today. Continue HEP  as Tolerated. Continue current medication regimen. Continue to Monitor.02/02/2020.  Tele-Health Visit Telephone Call Established Patient Location of Patient: [facility] Location of Provider: In the Office

## 2020-02-02 NOTE — Telephone Encounter (Signed)
It is better to have them on scheduled on separate days, so if there is some type of reaction we know what caused the issue. Thank you.

## 2020-02-02 NOTE — Telephone Encounter (Signed)
Received fax for rehab physician orders. Placed on PCP desk to review and sign, if appropriate.

## 2020-02-03 ENCOUNTER — Telehealth: Payer: Self-pay | Admitting: *Deleted

## 2020-02-03 NOTE — Telephone Encounter (Signed)
Call to patient.  Pt states that her appointments are already scheduled on separate days.  02/28/20 and 03/15/20.

## 2020-02-03 NOTE — Telephone Encounter (Signed)
Patient left a message stating that she is returning a call to Danella Sensing, ANP to inform what she found out from her allergy doctor.  She is asking for eunice to call her back

## 2020-02-03 NOTE — Telephone Encounter (Signed)
Return Linda [name] call, she reports the allergist said it was okay for her to receive the zilretta injections. She has an appointment with [name] on 03/15/2020.

## 2020-02-04 ENCOUNTER — Other Ambulatory Visit: Payer: Self-pay | Admitting: Family Medicine

## 2020-02-07 ENCOUNTER — Encounter: Payer: Self-pay | Admitting: Family Medicine

## 2020-02-08 NOTE — Telephone Encounter (Signed)
Signed by PCP and placed in bin.

## 2020-02-10 DIAGNOSIS — M25561 Pain in right knee: Secondary | ICD-10-CM | POA: Diagnosis not present

## 2020-02-10 DIAGNOSIS — R2681 Unsteadiness on feet: Secondary | ICD-10-CM | POA: Diagnosis not present

## 2020-02-10 DIAGNOSIS — I69398 Other sequelae of cerebral infarction: Secondary | ICD-10-CM | POA: Diagnosis not present

## 2020-02-10 DIAGNOSIS — R41841 Cognitive communication deficit: Secondary | ICD-10-CM | POA: Diagnosis not present

## 2020-02-10 DIAGNOSIS — M6281 Muscle weakness (generalized): Secondary | ICD-10-CM | POA: Diagnosis not present

## 2020-02-10 DIAGNOSIS — M25562 Pain in left knee: Secondary | ICD-10-CM | POA: Diagnosis not present

## 2020-02-11 DIAGNOSIS — M25562 Pain in left knee: Secondary | ICD-10-CM | POA: Diagnosis not present

## 2020-02-11 DIAGNOSIS — R41841 Cognitive communication deficit: Secondary | ICD-10-CM | POA: Diagnosis not present

## 2020-02-11 DIAGNOSIS — I69398 Other sequelae of cerebral infarction: Secondary | ICD-10-CM | POA: Diagnosis not present

## 2020-02-11 DIAGNOSIS — M25561 Pain in right knee: Secondary | ICD-10-CM | POA: Diagnosis not present

## 2020-02-11 DIAGNOSIS — M6281 Muscle weakness (generalized): Secondary | ICD-10-CM | POA: Diagnosis not present

## 2020-02-11 DIAGNOSIS — R2681 Unsteadiness on feet: Secondary | ICD-10-CM | POA: Diagnosis not present

## 2020-02-14 DIAGNOSIS — M6281 Muscle weakness (generalized): Secondary | ICD-10-CM | POA: Diagnosis not present

## 2020-02-14 DIAGNOSIS — M25562 Pain in left knee: Secondary | ICD-10-CM | POA: Diagnosis not present

## 2020-02-14 DIAGNOSIS — R2681 Unsteadiness on feet: Secondary | ICD-10-CM | POA: Diagnosis not present

## 2020-02-14 DIAGNOSIS — R41841 Cognitive communication deficit: Secondary | ICD-10-CM | POA: Diagnosis not present

## 2020-02-14 DIAGNOSIS — I69398 Other sequelae of cerebral infarction: Secondary | ICD-10-CM | POA: Diagnosis not present

## 2020-02-14 DIAGNOSIS — M25561 Pain in right knee: Secondary | ICD-10-CM | POA: Diagnosis not present

## 2020-02-15 DIAGNOSIS — R41841 Cognitive communication deficit: Secondary | ICD-10-CM | POA: Diagnosis not present

## 2020-02-15 DIAGNOSIS — R2681 Unsteadiness on feet: Secondary | ICD-10-CM | POA: Diagnosis not present

## 2020-02-15 DIAGNOSIS — M25562 Pain in left knee: Secondary | ICD-10-CM | POA: Diagnosis not present

## 2020-02-15 DIAGNOSIS — M6281 Muscle weakness (generalized): Secondary | ICD-10-CM | POA: Diagnosis not present

## 2020-02-15 DIAGNOSIS — M25561 Pain in right knee: Secondary | ICD-10-CM | POA: Diagnosis not present

## 2020-02-15 DIAGNOSIS — I69398 Other sequelae of cerebral infarction: Secondary | ICD-10-CM | POA: Diagnosis not present

## 2020-02-16 ENCOUNTER — Telehealth: Payer: Self-pay

## 2020-02-16 NOTE — Telephone Encounter (Signed)
Signed and put in box to go up front. Signed:  Crissie Sickles, MD           02/16/2020

## 2020-02-16 NOTE — Telephone Encounter (Signed)
Received speech therapy plan of care evaluation, Placed on PCP desk to review and sign, if appropriate.

## 2020-02-17 DIAGNOSIS — M6281 Muscle weakness (generalized): Secondary | ICD-10-CM | POA: Diagnosis not present

## 2020-02-17 DIAGNOSIS — R41841 Cognitive communication deficit: Secondary | ICD-10-CM | POA: Diagnosis not present

## 2020-02-17 DIAGNOSIS — I69398 Other sequelae of cerebral infarction: Secondary | ICD-10-CM | POA: Diagnosis not present

## 2020-02-17 DIAGNOSIS — M25561 Pain in right knee: Secondary | ICD-10-CM | POA: Diagnosis not present

## 2020-02-17 DIAGNOSIS — M25562 Pain in left knee: Secondary | ICD-10-CM | POA: Diagnosis not present

## 2020-02-17 DIAGNOSIS — R2681 Unsteadiness on feet: Secondary | ICD-10-CM | POA: Diagnosis not present

## 2020-02-18 DIAGNOSIS — M25562 Pain in left knee: Secondary | ICD-10-CM | POA: Diagnosis not present

## 2020-02-18 DIAGNOSIS — R2681 Unsteadiness on feet: Secondary | ICD-10-CM | POA: Diagnosis not present

## 2020-02-18 DIAGNOSIS — I69398 Other sequelae of cerebral infarction: Secondary | ICD-10-CM | POA: Diagnosis not present

## 2020-02-18 DIAGNOSIS — R41841 Cognitive communication deficit: Secondary | ICD-10-CM | POA: Diagnosis not present

## 2020-02-18 DIAGNOSIS — T7840XD Allergy, unspecified, subsequent encounter: Secondary | ICD-10-CM | POA: Diagnosis not present

## 2020-02-18 DIAGNOSIS — M25561 Pain in right knee: Secondary | ICD-10-CM | POA: Diagnosis not present

## 2020-02-18 DIAGNOSIS — M6281 Muscle weakness (generalized): Secondary | ICD-10-CM | POA: Diagnosis not present

## 2020-02-21 ENCOUNTER — Ambulatory Visit: Payer: BLUE CROSS/BLUE SHIELD | Admitting: Family Medicine

## 2020-02-24 DIAGNOSIS — M25561 Pain in right knee: Secondary | ICD-10-CM | POA: Diagnosis not present

## 2020-02-24 DIAGNOSIS — R2681 Unsteadiness on feet: Secondary | ICD-10-CM | POA: Diagnosis not present

## 2020-02-24 DIAGNOSIS — M25562 Pain in left knee: Secondary | ICD-10-CM | POA: Diagnosis not present

## 2020-02-24 DIAGNOSIS — I69398 Other sequelae of cerebral infarction: Secondary | ICD-10-CM | POA: Diagnosis not present

## 2020-02-24 DIAGNOSIS — M6281 Muscle weakness (generalized): Secondary | ICD-10-CM | POA: Diagnosis not present

## 2020-02-24 LAB — ALPHA-GAL PANEL
Alpha Gal IgE*: <0.1 kU/L (ref <0.10)
Beef (Bos spp) IgE: <0.1 kU/L (ref <0.35)
Class Interpretation: 0
Class Interpretation: 0
Class Interpretation: 0
Lamb/Mutton (Ovis spp) IgE: <0.1 kU/L (ref <0.35)
Pork (Sus spp) IgE: <0.1 kU/L (ref <0.35)

## 2020-02-25 DIAGNOSIS — M25561 Pain in right knee: Secondary | ICD-10-CM | POA: Diagnosis not present

## 2020-02-25 DIAGNOSIS — M25562 Pain in left knee: Secondary | ICD-10-CM | POA: Diagnosis not present

## 2020-02-25 DIAGNOSIS — I69398 Other sequelae of cerebral infarction: Secondary | ICD-10-CM | POA: Diagnosis not present

## 2020-02-25 DIAGNOSIS — R2681 Unsteadiness on feet: Secondary | ICD-10-CM | POA: Diagnosis not present

## 2020-02-25 DIAGNOSIS — M6281 Muscle weakness (generalized): Secondary | ICD-10-CM | POA: Diagnosis not present

## 2020-02-25 LAB — COMPLEMENT COMPONENT C1Q: Complement C1Q: 13.5 mg/dL (ref 10.3–20.5)

## 2020-02-25 LAB — C4 COMPLEMENT: Complement C4, Serum: 43 mg/dL — ABNORMAL HIGH (ref 12–38)

## 2020-02-25 LAB — FACTOR 12 ASSAY: Factor XII Activity: 127 % (ref 50–150)

## 2020-02-25 LAB — TRYPTASE: Tryptase: 10.1 ug/L (ref 2.2–13.2)

## 2020-02-25 LAB — C1 ESTERASE INHIBITOR: C1INH SerPl-mCnc: 38 mg/dL (ref 21–39)

## 2020-02-28 ENCOUNTER — Encounter: Payer: Self-pay | Admitting: Allergy and Immunology

## 2020-02-28 ENCOUNTER — Ambulatory Visit (INDEPENDENT_AMBULATORY_CARE_PROVIDER_SITE_OTHER): Payer: Medicare Other | Admitting: Allergy and Immunology

## 2020-02-28 ENCOUNTER — Other Ambulatory Visit: Payer: Self-pay

## 2020-02-28 VITALS — BP 150/88 | HR 89 | Temp 97.8°F | Resp 18

## 2020-02-28 DIAGNOSIS — J61 Pneumoconiosis due to asbestos and other mineral fibers: Secondary | ICD-10-CM

## 2020-02-28 DIAGNOSIS — T783XXD Angioneurotic edema, subsequent encounter: Secondary | ICD-10-CM

## 2020-02-28 DIAGNOSIS — T7840XD Allergy, unspecified, subsequent encounter: Secondary | ICD-10-CM

## 2020-02-28 NOTE — Assessment & Plan Note (Addendum)
   Follow with a pulmonologist.  I will defer pulmonary referral to primary care physician.

## 2020-02-28 NOTE — Progress Notes (Signed)
Follow-up Note  RE: Linda [name] MRN: [id number] DOB: [birth year] Date of Office Visit: 02/28/2020  Primary care provider: Tammi Sou, MD Referring provider: Tammi Sou, MD  History of present illness: Linda [name]"  Linda [name] is a 74 y.o. female with a history of CVA and angioedema presenting today for follow-up and allergy skin testing.  She was previously seen in this clinic for her initial evaluation on February 01, 2020.  She has had no episodes of angioedema in the interval since her previous visit.  Please see HPI from her visit on February 01, 2020 as well as ER summary for details.  Assessment and plan: Idiopathic angioedema The patients history suggests angioedema/allergic reaction with an unclear trigger. Food allergen skin tests were negative today despite a positive histamine control.  Lab work was negative.  The negative predictive value for skin tests is excellent (greater than 95%).   Should symptoms recur, a journal is to be kept recording any foods eaten, beverages consumed, medications taken within a 6 hour period prior to the onset of symptoms, as well as activities performed, and environmental conditions. For any symptoms concerning for anaphylaxis, epinephrine is to be administered and 911 is to be called immediately.  Pulmonary asbestosis ([place])  Follow with a pulmonologist.  I will defer pulmonary referral to primary care physician.   No orders of the defined types were placed in this encounter.   Diagnostics: Food allergen skin testing: Negative despite a positive histamine control.    Physical examination: Blood pressure (!) 150/88, pulse 89, temperature 97.8 F (36.6 C), temperature source Temporal, resp. rate 18, SpO2 94 %.  General: Alert, interactive, in no acute distress. Neck: Supple without lymphadenopathy. Lungs: Clear to auscultation without wheezing, rhonchi or rales. CV: Normal S1, S2 without murmurs. Skin: Warm and dry,  without lesions or rashes.  The following portions of the patient's history were reviewed and updated as appropriate: allergies, current medications, past family history, past medical history, past social history, past surgical history and problem list.  Current Outpatient Medications  Medication Sig Dispense Refill  . albuterol (VENTOLIN HFA) 108 (90 Base) MCG/ACT inhaler Inhale 1-2 puffs into the lungs every 6 (six) hours as needed for wheezing or shortness of breath. 18 g 1  . alendronate (FOSAMAX) 70 MG tablet Take 1 tablet (70 mg total) by mouth every 7 (seven) days. Take with a full glass of water on an empty stomach, first thing in the morning and remain up right for 30 minutes after taking. 12 tablet 3  . ALPRAZolam (XANAX) 1 MG tablet TAKE 1 TABLET THREE TIMES DAILY AS NEEDED FOR ANXIETY. (Patient taking differently: Take 1 mg by mouth 3 (three) times daily as needed for anxiety. ) 90 tablet 5  . aspirin 81 MG tablet Take 81 mg by mouth at bedtime.    . diclofenac sodium (VOLTAREN) 1 % GEL Apply 2 g topically 4 (four) times daily. (Patient taking differently: Apply 2 g topically 4 (four) times daily as needed. ) 350 g 4  . donepezil (ARICEPT) 10 MG tablet Take 1 tablet (10 mg total) by mouth at bedtime. 90 tablet 3  . DULoxetine (CYMBALTA) 60 MG capsule TAKE 1 CAPSULE A DAY TO BE COMBINED WITH 30MG  CAPSULE (Patient taking differently: Take 60 mg by mouth daily. Take with 30mg  to total 90mg  daily.) 90 capsule 3  . EPINEPHrine 0.3 mg/0.3 mL IJ SOAJ injection Inject 0.3 mLs (0.3 mg total) into the muscle as needed for  anaphylaxis. 1 each 1  . fluconazole (DIFLUCAN) 100 MG tablet 1 tab po qd prn thrush 60 tablet 1  . fluticasone (FLONASE) 50 MCG/ACT nasal spray Place 2 sprays into both nostrils daily. 16 g 11  . furosemide (LASIX) 40 MG tablet Take 1 tablet (40 mg total) by mouth daily as needed for fluid.    . isosorbide mononitrate (IMDUR) 30 MG 24 hr tablet TAKE 2 TABLETS IN THE AM AND 1  TABLET IN THE PM. 90 tablet 3  . lamoTRIgine (LAMICTAL) 150 MG tablet TAKE 1 TABLET EACH DAY. 90 tablet 1  . metoprolol succinate (TOPROL-XL) 50 MG 24 hr tablet TAKE (1) TABLET DAILY IN THE MORNING. (Patient taking differently: Take 50 mg by mouth daily. ) 90 tablet 3  . Multiple Vitamin (MULTIVITAMIN WITH MINERALS) TABS tablet Take 1 tablet by mouth daily.    . nitroGLYCERIN (NITROSTAT) 0.4 MG SL tablet Place 1 tablet (0.4 mg total) under the tongue every 5 (five) minutes as needed for chest pain (x 3 doses). 25 tablet 11  . ondansetron (ZOFRAN) 8 MG tablet Take 1 tablet (8 mg total) by mouth every 8 (eight) hours as needed for nausea or vomiting. 30 tablet 6  . oxyCODONE-acetaminophen (PERCOCET) 10-325 MG tablet TAKE (1) TABLET EVERY SIX HOURS AS NEEDED FOR PAIN. 100 tablet 0  . pantoprazole (PROTONIX) 40 MG tablet TAKE 1 TABLET BY MOUTH DAILY. 90 tablet 3  . rosuvastatin (CRESTOR) 20 MG tablet Take 1 tablet (20 mg total) by mouth daily. 90 tablet 3  . solifenacin (VESICARE) 5 MG tablet TAKE 1 TABLET ONCE DAILY. 30 tablet 1  . traZODone (DESYREL) 50 MG tablet TAKE 2-4 TABLETS AT BEDTIME 120 tablet 5  . Vitamin D, Ergocalciferol, (DRISDOL) 1.25 MG (50000 UT) CAPS capsule TAKE (1) CAPSULE TWICE A WEEK. 24 capsule 0  . ipratropium (ATROVENT) 0.03 % nasal spray Place 2 sprays into both nostrils every 12 (twelve) hours. (Patient not taking: Reported on 02/28/2020) 30 mL 11   No current facility-administered medications for this visit.   Facility-Administered Medications Ordered in Other Visits  Medication Dose Route Frequency Provider Last Rate Last Admin  . ferumoxytol (FERAHEME) 510 mg in sodium chloride 0.9 % 100 mL IVPB  510 mg Intravenous Once Volanda Napoleon, MD        Allergies  Allergen Reactions  . Abilify [Aripiprazole] Other (See Comments)    parkinsonism  . Penicillins Itching, Swelling and Rash    Tolerated Cefepime in ED. Has patient had a PCN reaction causing immediate rash,  facial/tongue/throat swelling, SOB or lightheadedness with hypotension: Yes Has patient had a PCN reaction causing severe rash involving mucus membranes or skin necrosis: No Has patient had a PCN reaction that required hospitalization: No  Has patient had a PCN reaction occurring within the last 10 years: No     I appreciate the opportunity to take part in [place] care. Please do not hesitate to contact me with questions.  Sincerely,   R. Edgar Frisk, MD

## 2020-02-28 NOTE — Assessment & Plan Note (Signed)
The patients history suggests angioedema/allergic reaction with an unclear trigger. Food allergen skin tests were negative today despite a positive histamine control.  Lab work was negative.  The negative predictive value for skin tests is excellent (greater than 95%).   Should symptoms recur, a journal is to be kept recording any foods eaten, beverages consumed, medications taken within a 6 hour period prior to the onset of symptoms, as well as activities performed, and environmental conditions. For any symptoms concerning for anaphylaxis, epinephrine is to be administered and 911 is to be called immediately.

## 2020-02-28 NOTE — Patient Instructions (Addendum)
Idiopathic angioedema The patients history suggests angioedema/allergic reaction with an unclear trigger. Food allergen skin tests were negative today despite a positive histamine control.  Lab work was negative.  The negative predictive value for skin tests is excellent (greater than 95%).   Should symptoms recur, a journal is to be kept recording any foods eaten, beverages consumed, medications taken within a 6 hour period prior to the onset of symptoms, as well as activities performed, and environmental conditions. For any symptoms concerning for anaphylaxis, epinephrine is to be administered and 911 is to be called immediately.  Pulmonary asbestosis ([place])  Follow with a pulmonologist.  I will defer pulmonary referral to primary care physician.   Follow up if needed.

## 2020-02-29 ENCOUNTER — Other Ambulatory Visit: Payer: Self-pay | Admitting: Registered Nurse

## 2020-02-29 ENCOUNTER — Other Ambulatory Visit: Payer: Self-pay | Admitting: Family Medicine

## 2020-02-29 DIAGNOSIS — M797 Fibromyalgia: Secondary | ICD-10-CM

## 2020-02-29 DIAGNOSIS — M1712 Unilateral primary osteoarthritis, left knee: Secondary | ICD-10-CM

## 2020-02-29 NOTE — Telephone Encounter (Signed)
RF request for Diflucan LOV:01/31/20 Next ov: 04/28/20 Last written:07/26/19( 60,1)  Please Advise. Medication pending

## 2020-03-01 NOTE — Telephone Encounter (Signed)
PMP was Reviewed: Last Oxycodone was was filled on 02/02/2020, Oxycodone e-scribed today. [name] has a scheduled appointment with [name] on 03/15/2020. [name] was called regarding the above and verbalizes understanding.

## 2020-03-03 ENCOUNTER — Telehealth: Payer: Self-pay

## 2020-03-03 MED ORDER — ALPRAZOLAM 1 MG PO TABS: ORAL_TABLET | ORAL | 5 refills | Status: DC

## 2020-03-03 NOTE — Telephone Encounter (Signed)
Nothing further needed 

## 2020-03-03 NOTE — Telephone Encounter (Signed)
Received fax for refill request regarding pt's alprazolam via pharmacy.  Requesting: alprazolam Contract:09/27/19 UDS:n/a Last Visit:01/31/20 Next Visit: advised to f/u 3 mo. Last Refill:10/11/19(90,5)  Please Advise. Fax placed on your desk.If approval, please sign fax and place on my desk.

## 2020-03-03 NOTE — Telephone Encounter (Signed)
OK, alpraz eRx'd 

## 2020-03-08 ENCOUNTER — Other Ambulatory Visit: Payer: Self-pay

## 2020-03-08 ENCOUNTER — Encounter: Payer: Self-pay | Admitting: Physical Medicine & Rehabilitation

## 2020-03-08 ENCOUNTER — Encounter: Payer: Medicare Other | Attending: Physical Medicine & Rehabilitation | Admitting: Physical Medicine & Rehabilitation

## 2020-03-08 VITALS — BP 112/72 | HR 75 | Temp 97.3°F | Ht 64.0 in | Wt 153.0 lb

## 2020-03-08 DIAGNOSIS — M170 Bilateral primary osteoarthritis of knee: Secondary | ICD-10-CM

## 2020-03-08 DIAGNOSIS — M1711 Unilateral primary osteoarthritis, right knee: Secondary | ICD-10-CM | POA: Diagnosis not present

## 2020-03-08 DIAGNOSIS — Z5181 Encounter for therapeutic drug level monitoring: Secondary | ICD-10-CM

## 2020-03-08 DIAGNOSIS — R2689 Other abnormalities of gait and mobility: Secondary | ICD-10-CM | POA: Insufficient documentation

## 2020-03-08 DIAGNOSIS — M1712 Unilateral primary osteoarthritis, left knee: Secondary | ICD-10-CM | POA: Diagnosis not present

## 2020-03-08 DIAGNOSIS — M797 Fibromyalgia: Secondary | ICD-10-CM | POA: Diagnosis not present

## 2020-03-08 DIAGNOSIS — Z79899 Other long term (current) drug therapy: Secondary | ICD-10-CM | POA: Insufficient documentation

## 2020-03-08 DIAGNOSIS — Z79891 Long term (current) use of opiate analgesic: Secondary | ICD-10-CM

## 2020-03-08 DIAGNOSIS — G894 Chronic pain syndrome: Secondary | ICD-10-CM

## 2020-03-08 MED ORDER — OXYCODONE-ACETAMINOPHEN 10-325 MG PO TABS: ORAL_TABLET | ORAL | 0 refills | Status: DC

## 2020-03-08 NOTE — [place] Notes (Signed)
PROCEDURE NOTE  DIAGNOSIS:  OA bilateral knees  INTERVENTION:   ZILRETTA INJECTION     After informed consent and preparation of the skin with betadine and isopropyl alcohol, I injected 32MG  of zilretta dissolved into 5cc of diluent into the bilateral knees via anterloateral approach. Contents of syring were shaken vigorously and aspiration was performed prior to injection. The patient tolerated well, and no complications were encountered. Afterward the area was cleaned and dressed. Post- injection instructions were provided including ice  if swelling or pain should occur.   Drug swab today  Filled percocet for April  See NP in 2 mos  Meredith Staggers, MD, [facility] 03/08/2020

## 2020-03-08 NOTE — Patient Instructions (Signed)
PLEASE FEEL FREE TO CALL OUR OFFICE WITH ANY PROBLEMS OR QUESTIONS ([phone])      

## 2020-03-11 LAB — DRUG TOX MONITOR 1 W/CONF, ORAL FLD
Alprazolam: 5.99 ng/mL — ABNORMAL HIGH (ref <0.50)
Amphetamines: NEGATIVE ng/mL (ref <10)
Barbiturates: NEGATIVE ng/mL (ref <10)
Benzodiazepines: POSITIVE ng/mL — AB (ref <0.50)
Buprenorphine: NEGATIVE ng/mL (ref <0.10)
Chlordiazepoxide: NEGATIVE ng/mL (ref <0.50)
Clonazepam: NEGATIVE ng/mL (ref <0.50)
Cocaine: NEGATIVE ng/mL (ref <5.0)
Codeine: NEGATIVE ng/mL (ref <2.5)
Diazepam: NEGATIVE ng/mL (ref <0.50)
Dihydrocodeine: NEGATIVE ng/mL (ref <2.5)
Fentanyl: NEGATIVE ng/mL (ref <0.10)
Flunitrazepam: NEGATIVE ng/mL (ref <0.50)
Flurazepam: NEGATIVE ng/mL (ref <0.50)
Heroin Metabolite: NEGATIVE ng/mL (ref <1.0)
Hydrocodone: NEGATIVE ng/mL (ref <2.5)
Hydromorphone: NEGATIVE ng/mL (ref <2.5)
Lorazepam: NEGATIVE ng/mL (ref <0.50)
MARIJUANA: NEGATIVE ng/mL (ref <2.5)
MDMA: NEGATIVE ng/mL (ref <10)
Meprobamate: NEGATIVE ng/mL (ref <2.5)
Methadone: NEGATIVE ng/mL (ref <5.0)
Midazolam: NEGATIVE ng/mL (ref <0.50)
Morphine: NEGATIVE ng/mL (ref <2.5)
Nicotine Metabolite: NEGATIVE ng/mL (ref <5.0)
Nordiazepam: NEGATIVE ng/mL (ref <0.50)
Norhydrocodone: NEGATIVE ng/mL (ref <2.5)
Noroxycodone: 195.9 ng/mL — ABNORMAL HIGH (ref <2.5)
Opiates: POSITIVE ng/mL — AB (ref <2.5)
Oxazepam: NEGATIVE ng/mL (ref <0.50)
Oxycodone: >250 ng/mL — ABNORMAL HIGH (ref <2.5)
Phencyclidine: NEGATIVE ng/mL (ref <10)
Tapentadol: NEGATIVE ng/mL (ref <5.0)
Temazepam: NEGATIVE ng/mL (ref <0.50)
Tramadol: NEGATIVE ng/mL (ref <5.0)
Triazolam: NEGATIVE ng/mL (ref <0.50)
Zolpidem: NEGATIVE ng/mL (ref <5.0)

## 2020-03-11 LAB — DRUG TOX ALC METAB W/CON, ORAL FLD: Alcohol Metabolite: NEGATIVE ng/mL (ref <25)

## 2020-03-13 ENCOUNTER — Telehealth: Payer: Self-pay

## 2020-03-13 NOTE — Telephone Encounter (Signed)
UDS RESULTS CONSISTENT WITH MEDICATIONS ON FILE  

## 2020-03-15 ENCOUNTER — Ambulatory Visit: Payer: Medicare Other | Admitting: Physical Medicine & Rehabilitation

## 2020-04-16 ENCOUNTER — Other Ambulatory Visit: Payer: Self-pay | Admitting: Family Medicine

## 2020-04-21 ENCOUNTER — Other Ambulatory Visit: Payer: Self-pay

## 2020-04-26 ENCOUNTER — Encounter: Payer: Medicare Other | Attending: Physical Medicine & Rehabilitation | Admitting: Registered Nurse

## 2020-04-26 ENCOUNTER — Encounter: Payer: Self-pay | Admitting: Registered Nurse

## 2020-04-26 ENCOUNTER — Other Ambulatory Visit: Payer: Self-pay

## 2020-04-26 VITALS — BP 110/68 | HR 73 | Temp 97.3°F | Ht 64.0 in | Wt 145.8 lb

## 2020-04-26 DIAGNOSIS — Z5181 Encounter for therapeutic drug level monitoring: Secondary | ICD-10-CM | POA: Diagnosis not present

## 2020-04-26 DIAGNOSIS — Z79899 Other long term (current) drug therapy: Secondary | ICD-10-CM | POA: Diagnosis not present

## 2020-04-26 DIAGNOSIS — G8929 Other chronic pain: Secondary | ICD-10-CM

## 2020-04-26 DIAGNOSIS — M797 Fibromyalgia: Secondary | ICD-10-CM | POA: Insufficient documentation

## 2020-04-26 DIAGNOSIS — M1711 Unilateral primary osteoarthritis, right knee: Secondary | ICD-10-CM | POA: Insufficient documentation

## 2020-04-26 DIAGNOSIS — M546 Pain in thoracic spine: Secondary | ICD-10-CM | POA: Diagnosis not present

## 2020-04-26 DIAGNOSIS — R2689 Other abnormalities of gait and mobility: Secondary | ICD-10-CM | POA: Diagnosis not present

## 2020-04-26 DIAGNOSIS — Z79891 Long term (current) use of opiate analgesic: Secondary | ICD-10-CM | POA: Insufficient documentation

## 2020-04-26 DIAGNOSIS — G894 Chronic pain syndrome: Secondary | ICD-10-CM | POA: Insufficient documentation

## 2020-04-26 DIAGNOSIS — M542 Cervicalgia: Secondary | ICD-10-CM | POA: Diagnosis not present

## 2020-04-26 DIAGNOSIS — M5416 Radiculopathy, lumbar region: Secondary | ICD-10-CM

## 2020-04-26 DIAGNOSIS — M1712 Unilateral primary osteoarthritis, left knee: Secondary | ICD-10-CM | POA: Diagnosis not present

## 2020-04-26 DIAGNOSIS — M5412 Radiculopathy, cervical region: Secondary | ICD-10-CM | POA: Diagnosis not present

## 2020-04-26 DIAGNOSIS — M7061 Trochanteric bursitis, right hip: Secondary | ICD-10-CM | POA: Diagnosis not present

## 2020-04-26 MED ORDER — OXYCODONE-ACETAMINOPHEN 10-325 MG PO TABS: ORAL_TABLET | ORAL | 0 refills | Status: DC

## 2020-04-26 NOTE — Progress Notes (Signed)
Subjective:    Patient ID: Linda [name], female    DOB: 03/03/1946, 74 y.o.   MRN: [id number]  HPI: Linda [name] is a 74 y.o. female who returns for follow up appointment for chronic pain and medication refill. She states her pain is located in her neck radiating into her bilateral shoulders, mid- lower back pain radiating into her right hip and right lower extremity and bilateral knee pain.She  rates her pain 7. Her current exercise regime is walking and performing stretching exercises.  Ms. Vullo Morphine equivalent is 60.00 MME.  She  is also prescribed Alprazolam by [name] We have discussed the black box warning of using opioids and benzodiazepines. I highlighted the dangers of using these drugs together and discussed the adverse events including respiratory suppression, overdose, cognitive impairment and importance of compliance with current regimen. We will continue to monitor and adjust as indicated.   Last Oral Swab was Performed on 03/08/2020, it was consistent.    Pain Inventory Average Pain 8 Pain Right Now 7 My pain is burning, stabbing and aching  In the last 24 hours, has pain interfered with the following? General activity 9 Relation with others 9 Enjoyment of life 8 What TIME of day is your pain at its worst? daytime evening and night Sleep (in general) Fair  Pain is worse with: walking, bending, standing and some activites Pain improves with: rest, heat/ice, medication and injections Relief from Meds: 5  Mobility use a cane use a walker how many minutes can you walk? 5-10 ability to climb steps?  yes do you drive?  yes  Function retired I need assistance with the following:  meal prep, household duties and shopping  Neuro/Psych bladder control problems trouble walking spasms anxiety  Prior Studies Any changes since last visit?  no  Physicians involved in your care Any changes since last visit?  no   Family History  Problem  Relation Age of Onset  . Arthritis Mother   . Kidney disease Mother   . Heart disease Father   . Stroke Father   . Hypertension Father   . Diabetes Father   . Heart attack Father   . Heart attack Paternal Grandmother   . Diabetes Sister        one sister  . Hypertension Sister   . Asthma Sister   . Hypertension Brother   . Asthma Brother   . Asthma Daughter   . Multiple sclerosis Son    Social History   Socioeconomic History  . Marital status: Widowed    Spouse name: Not on file  . Number of children: 2  . Years of education: Not on file  . Highest education level: Not on file  Occupational History  . 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 Smoking status: Never Smoker  . Smokeless tobacco: Never Used  . Tobacco comment: never used tobacco  Substance and Sexual Activity  . Alcohol use: No    Alcohol/week: 0.0 standard drinks  . Drug use: No  . Sexual activity: Not Currently  Other Topics Concern  . Not on file  Social History Narrative   Widowed, 2 sons.  Relocated to [place] 09/2012 to be closer to her son who has MS.   Husband d 2015--mesothelioma.   Occupation: former Pharmacist, hospital.   Education: masters degree level.   No T/A/Ds.   Lives in [place], independent living.   Social Determinants of Health  Financial Resource Strain:   . Difficulty of Paying Living Expenses:   Food Insecurity:   . Worried About Charity fundraiser in the Last Year:   . Arboriculturist in the Last Year:   Transportation Needs:   . Film/video editor (Medical):   [name] Kitchen Lack of Transportation (Non-Medical):   Physical Activity:   . Days of Exercise per Week:   . Minutes of Exercise per Session:   Stress:   . Feeling of Stress :   Social Connections:   . Frequency of Communication with Friends and Family:   . Frequency of Social Gatherings with Friends and Family:   . Attends Religious Services:   . Active Member of Clubs or Organizations:   .  Attends Archivist Meetings:   [name] Kitchen Marital Status:    Past Surgical History:  Procedure Laterality Date  . APPENDECTOMY  1960  . AXILLARY SURGERY Left 1978   Multiple "lump" in armpit per pt  . CARDIAC CATHETERIZATION  01/2013   nonobstructive CAD, EF 55-60%  . CARDIOVASCULAR STRESS TEST  02/22/15   Low risk myocard perf imaging; wall motion normal, normal EF  . carotid duplex doppler  10/21/2017   R vertebral flow suggestive of possible distal obstruction.  Pt declined further w/u as of 10/29/17 but need to revisit this problem periodically.  . COCCYX REMOVAL  1972  . DEXA  06/05/2017   T-score -3.1  . DILATION AND CURETTAGE OF UTERUS  ? 1970's  . ESOPHAGOGASTRODUODENOSCOPY N/A 07/19/2014   Gastritis found + in the setting of supratherapeutic INR, +plavix, + meloxicam.  . EYE SURGERY Left 2012-2013   "injections for ~ 1 yr; don't really know what for" (07/12/2013)  . HEEL SPUR SURGERY Left 2008  . KNEE SURGERY  2005  . LEFT HEART CATHETERIZATION WITH CORONARY ANGIOGRAM N/A 01/30/2013   Procedure: LEFT HEART CATHETERIZATION WITH CORONARY ANGIOGRAM;  Surgeon: Clent Demark, MD;  Location: [facility] CATH [facility];  Service: Cardiovascular;  Laterality: N/A;  . PLANTAR FASCIA RELEASE Left 2008  . SPIROMETRY  04/25/14   In hosp for acute asthma/COPD flare: mixed obstructive and restrictive lung disease. The FEV1 is severely reduced at 45% predicted.  FEV1 signif decreased compared to prior spirometry 07/23/13.  . TENDON RELEASE  1996   Right forearm and hand  . TOTAL ABDOMINAL HYSTERECTOMY  1974  . TRANSTHORACIC ECHOCARDIOGRAM  01/2013; 04/2014;08/2015; 09/2017   2014--NORMAL.  2015--focal basal septal hypertrophy, EF 55-60%, grade I diast dysfxn, mild LAE.  08/2015 EF 55-60%, nl LV syst fxn, grade I DD, valves wnl. 10/21/17: EF 65-70%, grd I DD, o/w normal.   Past Medical History:  Diagnosis Date  . Acute upper GI bleed 06/2014   while pt taking coumadin, plavix, and meloxicam---despite being told  not to take coumadin.  . Anginal pain ([place])    Nonobstructive CAD 2014; however, her cardiologist put her on a statin for this and NOT for hyperlipidemia per pt report.  Atyp CP 08/2017 at card f/u, plan for myoc perf imaging.  . Anxiety    panic attacks  . Asthma    w/ asbestososis   . BPPV (benign paroxysmal positional vertigo) 12/16/2012  . Chronic diastolic CHF (congestive heart failure) (HCC)    dry wt as of 11/06/16 is 168 lbs.  . Chronic lower back pain   . COPD (chronic obstructive pulmonary disease) ([place])   . DDD (degenerative disc disease)    lumbar and cervical.   . Diverticular  disease   . Fibromyalgia    Patient states dx was around her late 15s but she had sx's for years prior to this.  . H/O hiatal hernia   . Hay fever   . History of pneumonia    hospitalized 12/2011, 02/2013, and 07/2013 [facility]) for this  . HTN (hypertension)    Renal artery dopplers 04/2013 neg for stenosis.  . Hypervitaminosis D 09/27/2019   over-supplemented.  Stopped vit D and plan recheck 2 mo.  . Idiopathic angio-edema-urticaria [id number]; 2021   Angioedema component was very minimal.  2021->Dr. Bobbitt (allergist) eval.  . Insomnia   . Iron deficiency anemia    [place]ematologist in [name], [place] did [place] w/u; no cause found; failed oral supplement;; gets fairly regular (q50m or so) IV iron infusions (Venofer -iron sucrose- 200mg  with procrit.  "for 14 yr I've been getting blood work q month & getting infusions prn" (07/12/2013).  Dr. Marin Olp locally, iron infusions done, EPO deficiency dx'd  . Migraine syndrome    "not as often anymore; used to be ~ q wk" (07/12/2013)  . Mixed incontinence urge and stress   . Nephrolithiasis    "passed all on my own or they are still in there" (07/12/2013)  . Neuroleptic induced Parkinsonism ([place]) 2018   Dr. Carles Collet, neuro, saw her 11/24/17 and recommended d/c of abilify as first step.  D/c'd abilify and pt got complete recovery.  . OSA on CPAP    prior to move to [place]--had  another sleep study 10/2015 w/pulm Dr. Camillo Flaming.  . Osteoarthritis    "severe; progressing fast" (07/12/2013); multiple joints-not surgical candidate for TKR (03/2015).  Triamcinolon knee injections by Dr. Tessa Lerner 12/2017.  [name] Kitchen Pernicious anemia 08/24/2014  . Pleural plaque with presence of asbestos 07/22/2013  . Pulmonary embolism ([place]) 07/2013   Dx at [facility] with very small peripheral upper lobe pe 07/2013: pt took coumadin for about 8-9 mo  . Pyelonephritis    "several times over the last 30 yr" (07/12/2013)  . RBBB (right bundle branch block)   . Recurrent major depression ([place])   . Recurrent UTI    hx of hospitalization for pyelonephritis; started abx prophylaxis 06/2015  . Syncope    Hypotensive; ED visit--Dr. Terrence Dupont did Cath--nonobstructive CAD, EF 55-60%.  In retrospect, suspect pt rx med misuse/polypharmacy  . Tension headache, chronic    BP 110/68   Pulse 73   Temp (!) 97.3 F (36.3 C)   Ht 5\' 4"  (1.626 m)   Wt 145 lb 12.8 oz (66.1 kg)   SpO2 92%   BMI 25.03 kg/m   Opioid Risk Score:   Fall Risk Score:  `1  Depression screen PHQ 2/9  Depression screen [facility] 2/9 04/26/2020 09/27/2019 02/24/2019 12/25/2018 09/02/2018 07/01/2018 05/27/2018  Decreased Interest 1 1 1 1 1 1 1   Down, Depressed, Hopeless 1 2 1 1 1 1 1   PHQ - 2 Score 2 3 2 2 2 2 2   Altered sleeping - 1 - - - - 0  Tired, decreased energy - 1 - - - - 1  Change in appetite - 0 - - - - 0  Feeling bad or failure about yourself  - 0 - - - - 0  Trouble concentrating - 1 - - - - 0  Moving slowly or fidgety/restless - 0 - - - - 0  Suicidal thoughts - 0 - - - - 0  PHQ-9 Score - 6 - - - - 3  Difficult doing work/chores - Not  difficult at all - - - - Not difficult at all  Some recent data might be hidden    Review of Systems  Constitutional: Positive for unexpected weight change.  HENT: Negative.   Eyes: Negative.   Respiratory: Positive for shortness of breath.   Cardiovascular: Positive for leg swelling.  Gastrointestinal: Positive  for constipation and nausea.  Endocrine: Negative.   Genitourinary:       Bladder control  Musculoskeletal: Positive for gait problem.       Spasms  Skin: Negative.   Allergic/Immunologic: Negative.   Hematological: Negative.   Psychiatric/Behavioral: The patient is nervous/anxious.   All other systems reviewed and are negative.      Objective:   Physical Exam Vitals and nursing note reviewed.  Constitutional:      Appearance: Normal appearance.  Neck:     Comments: Cervical Paraspinal Tenderness: C-5-C-6 Cardiovascular:     Rate and Rhythm: Normal rate and regular rhythm.     Pulses: Normal pulses.     Heart sounds: Normal heart sounds.  Pulmonary:     Effort: Pulmonary effort is normal.     Breath sounds: Normal breath sounds.  Musculoskeletal:     Cervical back: Normal range of motion and neck supple.     Comments: Normal Muscle Bulk and Muscle Testing Reveals:  Upper Extremities: Full ROM and Muscle Strength 5/5 Bilateral AC Joint Tenderness  Thoracic Paraspinal Tenderness: T-7-T-9 Lumbar Paraspinal Tenderness: L-3-L-5 Lower Extremities: Full ROM and Muscle Strength 5/5 Right Lower Extremity Flexion Produces Pain into her Right Lower Extremity, Right Hip and Right Knee Arises from chair slowly Narrow Based Gait   Skin:    General: Skin is warm and dry.  Neurological:     Mental Status: She is alert and oriented to person, place, and time.  Psychiatric:        Mood and Affect: Mood normal.        Behavior: Behavior normal.           Assessment & Plan:  1. Functional deficits secondary to Gait disorder:Continue with HEP.04/26/2020. 2. Chronic Back pain/ Lumbar Radiculitis/fibromyalgia /R>L Knee OA Pain Management:04/26/2020 Refilled: Oxycodone 10/325mg one tablet every 6 hours #100. ContinueVoltaren Gel. We will continue the opioid monitoring program, this consists of regular clinic visits, examinations, urine drug screen, pill counts as well as use of  [place] Controlled Substance reporting System. 3. Depression with anxiety/Grief reaction/Mood:04/26/2020 Continue Xanax,Trazodone and Cymbalta . PCP Following. 4. [name] with asthma:. Albuterol prn. :Pulmonology Following.04/26/2020. 5. Bilateral Osteoarthritis of Bilateral Knees: Ms. Everist Requesting Bilateral Zilretta Injection, she will be schedule with [name] next month, she verbalizes understanding. S/PBilateralZilretta Injections on 03/17/2021with relief noted.Continue to Monitor.04/26/2020 6. Fibromyalgia: Continue Home exercise regimen as tolerated. Continue to Monitor.04/26/2020 7. Cervicalgia/ Cervical Radiculitis: Continue HEP as tolerated. Alternate with heat and ice therapy. Continue to monitor.04/26/2020. 8.RightGreater Trochanteric Bursitis:Continue toAlternate Ice and Heat Therapy. Continue to Monitor.04/26/2020. 9. Chronic Bilateral Shoulder Pain: Continue HEP as Tolerated. Continue current medication regimen. Continue to Monitor.04/26/2020.  15 minutes of face to face patient care time was spent during this visit. All questions were encouraged and answered.  F/U in 2 months

## 2020-04-27 ENCOUNTER — Other Ambulatory Visit: Payer: Self-pay | Admitting: Cardiology

## 2020-04-27 ENCOUNTER — Other Ambulatory Visit: Payer: Self-pay | Admitting: Family Medicine

## 2020-04-28 ENCOUNTER — Other Ambulatory Visit: Payer: Self-pay

## 2020-04-28 ENCOUNTER — Telehealth (INDEPENDENT_AMBULATORY_CARE_PROVIDER_SITE_OTHER): Payer: Medicare Other | Admitting: Family Medicine

## 2020-04-28 ENCOUNTER — Encounter: Payer: Self-pay | Admitting: Family Medicine

## 2020-04-28 VITALS — BP 110/68 | Ht 64.0 in

## 2020-04-28 DIAGNOSIS — N3941 Urge incontinence: Secondary | ICD-10-CM | POA: Diagnosis not present

## 2020-04-28 DIAGNOSIS — F411 Generalized anxiety disorder: Secondary | ICD-10-CM

## 2020-04-28 DIAGNOSIS — F339 Major depressive disorder, recurrent, unspecified: Secondary | ICD-10-CM | POA: Diagnosis not present

## 2020-04-28 DIAGNOSIS — E7800 Pure hypercholesterolemia, unspecified: Secondary | ICD-10-CM

## 2020-04-28 DIAGNOSIS — I10 Essential (primary) hypertension: Secondary | ICD-10-CM | POA: Diagnosis not present

## 2020-04-28 MED ORDER — SOLIFENACIN SUCCINATE 10 MG PO TABS
10.0000 mg | ORAL_TABLET | Freq: Every day | ORAL | 6 refills | Status: DC
Start: 2020-04-28 — End: 2020-11-23

## 2020-04-28 NOTE — Progress Notes (Signed)
Virtual Visit via Video Note  I connected with Linda [name] on 04/28/20 at  1:00 PM EDT by a video enabled telemedicine application and verified that I am speaking with the correct person using two identifiers.  Location patient: home Location provider:work or home office Persons participating in the virtual visit: patient, provider  I discussed the limitations of evaluation and management by telemedicine and the availability of in person appointments. The patient expressed understanding and agreed to proceed.  Telemedicine visit is a necessity given the COVID-19 restrictions in place at the current time.  HPI: 74 y/o WF being seen today for f/u HTN, MCI, recurrent MDD/GAD. She has COPD and hx of chronic diastolic HF.  Feeling well, no acute complaints. Mood and anxiety levels very stable lately. Functioning well from this standpoint. Taking lamictal 150mg  qd, cymbalta 90 mg qd and xanax 1mg  tid. PMP AWARE reviewed today: most recent rx for xanax 1mg  was filled 04/01/20, # 15, rx by me. No red flags.   BP normal lately, "but it goes up sometimes".  Says it has been normal lately, though. Chronic knee pain limiting mobility but she walks around as much as she can, using walker.  Still having urinary urgency, frequency, leaking, wearing depends all the time. Vesicare has helped at 5 mg qd dose but she asks for increase.  No side effects.  HLD: takes statin daily w/out problem.  ROS: no fevers, no CP, no SOB, no wheezing, no cough, no dizziness, no HAs, no rashes, no melena/hematochezia. No polydipsia.  No myalgias. No focal weakness, paresthesias, or tremors.  No acute vision or hearing abnormalities. No n/v/d or abd pain.  No palpitations.     Past Medical History:  Diagnosis Date  . Acute upper GI bleed 06/2014   while Linda [name] taking coumadin, plavix, and meloxicam---despite being told not to take coumadin.  . Anginal pain ([place])    Nonobstructive CAD 2014; however, her cardiologist put her on  a statin for this and NOT for hyperlipidemia per Linda [name] report.  Atyp CP 08/2017 at card f/u, plan for myoc perf imaging.  . Anxiety    panic attacks  . Asthma    w/ asbestososis   . BPPV (benign paroxysmal positional vertigo) 12/16/2012  . Chronic diastolic CHF (congestive heart failure) (HCC)    dry wt as of 11/06/16 is 168 lbs.  . Chronic lower back pain   . COPD (chronic obstructive pulmonary disease) ([place])   . DDD (degenerative disc disease)    lumbar and cervical.   . Diverticular disease   . Fibromyalgia    Patient states dx was around her late 51s but she had sx's for years prior to this.  . H/O hiatal hernia   . Hay fever   . History of pneumonia    hospitalized 12/2011, 02/2013, and 07/2013 [facility]) for this  . HTN (hypertension)    Renal artery dopplers 04/2013 neg for stenosis.  . Hypervitaminosis D 09/27/2019   over-supplemented.  Stopped vit D and plan recheck 2 mo.  . Idiopathic angio-edema-urticaria [id number]; 2021   Angioedema component was very minimal.  2021->Dr. Bobbitt (allergist) eval.  . Insomnia   . Iron deficiency anemia    Hematologist in [facility] [place] did extensive w/u; no cause found; failed oral supplement;; gets fairly regular (q82m or so) IV iron infusions (Venofer -iron sucrose- 200mg  with procrit.  "for 14 yr I've been getting blood work q month & getting infusions prn" (07/12/2013).  Dr. Marin Olp locally, iron infusions done, EPO  deficiency dx'd  . Migraine syndrome    "not as often anymore; used to be ~ q wk" (07/12/2013)  . Mixed incontinence urge and stress   . Nephrolithiasis    "passed all on my own or they are still in there" (07/12/2013)  . Neuroleptic induced Parkinsonism ([place]) 2018   Dr. Carles Collet, neuro, saw her 11/24/17 and recommended d/c of abilify as first step.  D/c'd abilify and Linda [name] got complete recovery.  . OSA on CPAP    prior to move to [place]--had another sleep study 10/2015 w/pulm Dr. Camillo Flaming.  . Osteoarthritis    "severe; progressing fast" (07/12/2013);  multiple joints-not surgical candidate for TKR (03/2015).  Triamcinolon knee injections by Dr. Tessa Lerner 12/2017.  [name] Kitchen Pernicious anemia 08/24/2014  . Pleural plaque with presence of asbestos 07/22/2013  . Pulmonary embolism ([place]) 07/2013   Dx at [facility] with very small peripheral upper lobe pe 07/2013: Linda [name] took coumadin for about 8-9 mo  . Pyelonephritis    "several times over the last 30 yr" (07/12/2013)  . RBBB (right bundle branch block)   . Recurrent major depression ([place])   . Recurrent UTI    hx of hospitalization for pyelonephritis; started abx prophylaxis 06/2015  . Syncope    Hypotensive; ED visit--Dr. Terrence Dupont did Cath--nonobstructive CAD, EF 55-60%.  In retrospect, suspect Linda [name] rx med misuse/polypharmacy  . Tension headache, chronic     Past Surgical History:  Procedure Laterality Date  . APPENDECTOMY  1960  . AXILLARY SURGERY Left 1978   Multiple "lump" in armpit per Linda [name]  . CARDIAC CATHETERIZATION  01/2013   nonobstructive CAD, EF 55-60%  . CARDIOVASCULAR STRESS TEST  02/22/15   Low risk myocard perf imaging; wall motion normal, normal EF  . carotid duplex doppler  10/21/2017   R vertebral flow suggestive of possible distal obstruction.  Linda [name] declined further w/u as of 10/29/17 but need to revisit this problem periodically.  . COCCYX REMOVAL  1972  . DEXA  06/05/2017   T-score -3.1  . DILATION AND CURETTAGE OF UTERUS  ? 1970's  . ESOPHAGOGASTRODUODENOSCOPY N/A 07/19/2014   Gastritis found + in the setting of supratherapeutic INR, +plavix, + meloxicam.  . EYE SURGERY Left 2012-2013   "injections for ~ 1 yr; don't really know what for" (07/12/2013)  . HEEL SPUR SURGERY Left 2008  . KNEE SURGERY  2005  . LEFT HEART CATHETERIZATION WITH CORONARY ANGIOGRAM N/A 01/30/2013   Procedure: LEFT HEART CATHETERIZATION WITH CORONARY ANGIOGRAM;  Surgeon: Clent Demark, MD;  Location: [facility] CATH LAB;  Service: Cardiovascular;  Laterality: N/A;  . PLANTAR FASCIA RELEASE Left 2008  . SPIROMETRY  04/25/14   In hosp  for acute asthma/COPD flare: mixed obstructive and restrictive lung disease. The FEV1 is severely reduced at 45% predicted.  FEV1 signif decreased compared to prior spirometry 07/23/13.  . TENDON RELEASE  1996   Right forearm and hand  . TOTAL ABDOMINAL HYSTERECTOMY  1974  . TRANSTHORACIC ECHOCARDIOGRAM  01/2013; 04/2014;08/2015; 09/2017   2014--NORMAL.  2015--focal basal septal hypertrophy, EF 55-60%, grade I diast dysfxn, mild LAE.  08/2015 EF 55-60%, nl LV syst fxn, grade I DD, valves wnl. 10/21/17: EF 65-70%, grd I DD, o/w normal.    Family History  Problem Relation Age of Onset  . Arthritis Mother   . Kidney disease Mother   . Heart disease Father   . Stroke Father   . Hypertension Father   . Diabetes Father   . Heart attack Father   .  Heart attack Paternal Grandmother   . Diabetes Sister        one sister  . Hypertension Sister   . Asthma Sister   . Hypertension Brother   . Asthma Brother   . Asthma Daughter   . Multiple sclerosis Son      Current Outpatient Medications:  .  albuterol (VENTOLIN HFA) 108 (90 Base) MCG/ACT inhaler, Inhale 1-2 puffs into the lungs every 6 (six) hours as needed for wheezing or shortness of breath., Disp: 18 g, Rfl: 1 .  alendronate (FOSAMAX) 70 MG tablet, Take 1 tablet (70 mg total) by mouth every 7 (seven) days. Take with a full glass of water on an empty stomach, first thing in the morning and remain up right for 30 minutes after taking., Disp: 12 tablet, Rfl: 3 .  ALPRAZolam (XANAX) 1 MG tablet, TAKE 1 TABLET THREE TIMES DAILY AS NEEDED FOR ANXIETY., Disp: 90 tablet, Rfl: 5 .  aspirin 81 MG tablet, Take 81 mg by mouth at bedtime., Disp: , Rfl:  .  diclofenac sodium (VOLTAREN) 1 % GEL, Apply 2 g topically 4 (four) times daily. (Patient taking differently: Apply 2 g topically 4 (four) times daily as needed. ), Disp: 350 g, Rfl: 4 .  donepezil (ARICEPT) 10 MG tablet, Take 1 tablet (10 mg total) by mouth at bedtime., Disp: 90 tablet, Rfl: 3 .   DULoxetine (CYMBALTA) 30 MG capsule, TAKE 1 CAPSULE ONCE DAILY ALONG WITH 60MG  CAPSULE FOR TOTAL 90MG  DAILY., Disp: 90 capsule, Rfl: 0 .  DULoxetine (CYMBALTA) 60 MG capsule, TAKE 1 CAPSULE A DAY TO BE COMBINED WITH 30MG  CAPSULE (Patient taking differently: Take 60 mg by mouth daily. Take with 30mg  to total 90mg  daily.), Disp: 90 capsule, Rfl: 3 .  EPINEPHrine 0.3 mg/0.3 mL IJ SOAJ injection, Inject 0.3 mLs (0.3 mg total) into the muscle as needed for anaphylaxis., Disp: 1 each, Rfl: 1 .  fluconazole (DIFLUCAN) 100 MG tablet, TAKE (1) TABLET DAILY AS NEEDED FOR THRUSH, Disp: 15 tablet, Rfl: 1 .  fluticasone (FLONASE) 50 MCG/ACT nasal spray, Place 2 sprays into both nostrils daily., Disp: 16 g, Rfl: 11 .  furosemide (LASIX) 40 MG tablet, Take 1 tablet (40 mg total) by mouth daily as needed for fluid., Disp: , Rfl:  .  ipratropium (ATROVENT) 0.03 % nasal spray, Place 2 sprays into both nostrils every 12 (twelve) hours., Disp: 30 mL, Rfl: 11 .  isosorbide mononitrate (IMDUR) 30 MG 24 hr tablet, TAKE 2 TABLETS IN THE AM AND 1 TABLET IN THE PM., Disp: 90 tablet, Rfl: 0 .  lamoTRIgine (LAMICTAL) 150 MG tablet, TAKE 1 TABLET EACH DAY., Disp: 90 tablet, Rfl: 1 .  metoprolol succinate (TOPROL-XL) 50 MG 24 hr tablet, TAKE (1) TABLET DAILY IN THE MORNING. (Patient taking differently: Take 50 mg by mouth daily. ), Disp: 90 tablet, Rfl: 3 .  minocycline (MINOCIN) 50 MG capsule, Take 50 mg by mouth 2 (two) times daily., Disp: , Rfl:  .  Multiple Vitamin (MULTIVITAMIN WITH MINERALS) TABS tablet, Take 1 tablet by mouth daily., Disp: , Rfl:  .  nitroGLYCERIN (NITROSTAT) 0.4 MG SL tablet, Place 1 tablet (0.4 mg total) under the tongue every 5 (five) minutes as needed for chest pain (x 3 doses)., Disp: 25 tablet, Rfl: 11 .  ondansetron (ZOFRAN) 8 MG tablet, Take 1 tablet (8 mg total) by mouth every 8 (eight) hours as needed for nausea or vomiting., Disp: 30 tablet, Rfl: 6 .  oxyCODONE-acetaminophen (PERCOCET) 10-325 MG  tablet, Every 6 hours as needed, Disp: 100 tablet, Rfl: 0 .  pantoprazole (PROTONIX) 40 MG tablet, TAKE 1 TABLET BY MOUTH DAILY., Disp: 90 tablet, Rfl: 3 .  rosuvastatin (CRESTOR) 20 MG tablet, Take 1 tablet (20 mg total) by mouth daily., Disp: 90 tablet, Rfl: 3 .  solifenacin (VESICARE) 5 MG tablet, TAKE 1 TABLET ONCE DAILY., Disp: 30 tablet, Rfl: 1 .  traZODone (DESYREL) 50 MG tablet, TAKE 2-4 TABLETS AT BEDTIME, Disp: 120 tablet, Rfl: 5 .  Vitamin D, Ergocalciferol, (DRISDOL) 1.25 MG (50000 UT) CAPS capsule, TAKE (1) CAPSULE TWICE A WEEK., Disp: 24 capsule, Rfl: 0 No current facility-administered medications for this visit.  Facility-Administered Medications Ordered in Other Visits:  .  ferumoxytol (FERAHEME) 510 mg in sodium chloride 0.9 % 100 mL IVPB, 510 mg, Intravenous, Once, Ennever, Rudell Cobb, MD  EXAM:  VITALS per patient if applicable: BP [id number]   Ht 5\' 4"  (1.626 m)   BMI 25.03 kg/m    GENERAL: alert, oriented, appears well and in no acute distress  HEENT: atraumatic, conjunttiva clear, no obvious abnormalities on inspection of external nose and ears  NECK: normal movements of the head and neck  LUNGS: on inspection no signs of respiratory distress, breathing rate appears normal, no obvious gross SOB, gasping or wheezing  CV: no obvious cyanosis  MS: moves all visible extremities without noticeable abnormality  PSYCH/NEURO: pleasant and cooperative, no obvious depression or anxiety, speech and thought processing grossly intact  LABS: none today  Lab Results  Component Value Date   TSH 2.750 10/04/2019   Lab Results  Component Value Date   WBC 8.5 01/20/2020   HGB 14.3 01/20/2020   HCT 42.0 01/20/2020   MCV 91.4 01/20/2020   PLT 250 01/20/2020   Lab Results  Component Value Date   IRON 116 06/05/2015   TIBC 284 06/05/2015   FERRITIN 1,612 (H) 06/05/2015   Lab Results  Component Value Date   CREATININE 0.80 01/20/2020   BUN 14 01/20/2020   NA 137  01/20/2020   K 3.7 01/20/2020   CL 105 01/20/2020   CO2 20 (L) 01/20/2020   Lab Results  Component Value Date   ALT 15 01/20/2020   AST 21 01/20/2020   ALKPHOS 62 01/20/2020   BILITOT 0.5 01/20/2020   Lab Results  Component Value Date   CHOL 172 09/22/2018   Lab Results  Component Value Date   HDL 43.70 09/22/2018   Lab Results  Component Value Date   LDLCALC 92 09/22/2018   Lab Results  Component Value Date   TRIG 184.0 (H) 09/22/2018   Lab Results  Component Value Date   CHOLHDL 4 09/22/2018   Lab Results  Component Value Date   HGBA1C 5.6 01/02/2016   ASSESSMENT AND PLAN:  Discussed the following assessment and plan:  1) Recurrent MDD + GAD: all stable on current med regimen. No changes, no new rx's/RF's needed today. CSC for alpraz UTD.  2) HTN: The current medical regimen is effective;  continue present plan and medications. Continue toprol xl 50mg  qd.  Also on imdur. BMET future.  3) HLD: tolerating crestor. FLP future.  4) Urge incontinence/OAB: improved with vesicare 5mg  but room for improvement, no side effects from med. Increase vesicare to 10mg  qd.   I discussed the assessment and treatment plan with the patient. The patient was provided an opportunity to ask questions and all were answered. The patient agreed with the plan and demonstrated an understanding of the  instructions.   The patient was advised to call back or seek an in-person evaluation if the symptoms worsen or if the condition fails to improve as anticipated.  F/u: 3 mo.  Lab visit at her earliest convenience for FLP and BMET.  Signed:  Crissie Sickles, MD           04/28/2020

## 2020-05-02 ENCOUNTER — Ambulatory Visit: Payer: Medicare Other

## 2020-05-03 ENCOUNTER — Other Ambulatory Visit: Payer: Self-pay | Admitting: Family Medicine

## 2020-05-04 ENCOUNTER — Ambulatory Visit: Payer: Medicare Other

## 2020-05-05 ENCOUNTER — Ambulatory Visit: Payer: Medicare Other

## 2020-05-19 ENCOUNTER — Other Ambulatory Visit: Payer: Self-pay | Admitting: Cardiology

## 2020-05-23 ENCOUNTER — Other Ambulatory Visit: Payer: Self-pay | Admitting: Cardiology

## 2020-05-23 ENCOUNTER — Telehealth: Payer: Self-pay

## 2020-05-23 NOTE — Telephone Encounter (Signed)
Pt was called and advised she should not take medication that was not RXed to her and reviewed by her MD. She was scheduled for appt to discuss medications/treatment

## 2020-05-23 NOTE — Telephone Encounter (Signed)
Patient called in wanting to let Dr know that she is in intense paine from her neck all the way down to her lower back. Even affecting the hip. Patient states that she would like Dr to prescribe her a muscle relaxer. States a friend came and gave her some yesterday because she couldn't do for her self at at all yesterday. She was given Gabapentin 100 mg 1 capsule 3x a day.      Please call and advise

## 2020-05-23 NOTE — Telephone Encounter (Signed)
*  STAT* If patient is at the pharmacy, call can be transferred to refill team.   1. Which medications need to be refilled? (please list name of each medication and dose if known) isosorbide mononitrate (IMDUR) 30 MG 24 hr tablet  2. Which pharmacy/location (including street and city if local pharmacy) is medication to be sent to? [place], [place].  3. Do they need a 30 day or 90 day supply? 90 day supply

## 2020-05-24 ENCOUNTER — Other Ambulatory Visit: Payer: Self-pay

## 2020-05-24 ENCOUNTER — Encounter: Payer: Medicare Other | Attending: Physical Medicine & Rehabilitation | Admitting: Physical Medicine & Rehabilitation

## 2020-05-24 ENCOUNTER — Encounter: Payer: Self-pay | Admitting: Physical Medicine & Rehabilitation

## 2020-05-24 VITALS — BP 115/72 | HR 77 | Ht 64.0 in | Wt 146.0 lb

## 2020-05-24 DIAGNOSIS — M1712 Unilateral primary osteoarthritis, left knee: Secondary | ICD-10-CM | POA: Diagnosis present

## 2020-05-24 DIAGNOSIS — Z5181 Encounter for therapeutic drug level monitoring: Secondary | ICD-10-CM | POA: Insufficient documentation

## 2020-05-24 DIAGNOSIS — M47816 Spondylosis without myelopathy or radiculopathy, lumbar region: Secondary | ICD-10-CM | POA: Diagnosis present

## 2020-05-24 DIAGNOSIS — Z79891 Long term (current) use of opiate analgesic: Secondary | ICD-10-CM | POA: Diagnosis present

## 2020-05-24 DIAGNOSIS — Z79899 Other long term (current) drug therapy: Secondary | ICD-10-CM | POA: Insufficient documentation

## 2020-05-24 DIAGNOSIS — R2689 Other abnormalities of gait and mobility: Secondary | ICD-10-CM | POA: Insufficient documentation

## 2020-05-24 DIAGNOSIS — M1711 Unilateral primary osteoarthritis, right knee: Secondary | ICD-10-CM | POA: Insufficient documentation

## 2020-05-24 DIAGNOSIS — G894 Chronic pain syndrome: Secondary | ICD-10-CM | POA: Diagnosis present

## 2020-05-24 DIAGNOSIS — M797 Fibromyalgia: Secondary | ICD-10-CM | POA: Diagnosis present

## 2020-05-24 MED ORDER — CYCLOBENZAPRINE HCL 5 MG PO TABS: 5.0000 mg | ORAL_TABLET | Freq: Three times a day (TID) | ORAL | 1 refills | Status: DC | PRN

## 2020-05-24 MED ORDER — OXYCODONE-ACETAMINOPHEN 10-325 MG PO TABS: ORAL_TABLET | ORAL | 0 refills | Status: DC

## 2020-05-24 MED ORDER — ISOSORBIDE MONONITRATE ER 30 MG PO TB24: ORAL_TABLET | ORAL | 1 refills | Status: DC

## 2020-05-24 NOTE — Patient Instructions (Signed)
PLEASE FEEL FREE TO CALL OUR OFFICE WITH ANY PROBLEMS OR QUESTIONS [phone])     DON'T FORGET TO USE HEAT ON YOUR BACK. TRY SOME GENTLE STRETCHING ALSO.

## 2020-05-24 NOTE — Progress Notes (Signed)
PROCEDURE NOTE  DIAGNOSIS:  OA OF BILATERAL KNEES  INTERVENTION:   ZILRETTA INJECTION     After informed consent and preparation of the skin with betadine and isopropyl alcohol, I injected 32MG  of zilretta dissolved into 5cc of diluent into bilateral knees via anterolateral approach. Contents of syring were shaken vigorously and aspiration was performed prior to injection. The patient tolerated well, and no complications were encountered. Afterward the area was cleaned and dressed. Post- injection instructions were provided including ice  if swelling or pain should occur.    Wrote rx for low back spasms. Asked patient to try gentle rom and heat as well. If no improvement, can consider imaging of back   Meredith Staggers, MD, [facility] 05/24/2020

## 2020-05-26 ENCOUNTER — Ambulatory Visit: Payer: Medicare Other | Admitting: Family Medicine

## 2020-05-29 ENCOUNTER — Other Ambulatory Visit: Payer: Self-pay

## 2020-05-29 MED ORDER — ALPRAZOLAM 1 MG PO TABS: ORAL_TABLET | ORAL | 0 refills | Status: DC

## 2020-05-29 NOTE — Telephone Encounter (Signed)
Requesting: alprazolam Contract:09/27/19 UDS: n/a Last Visit:04/28/20 Next Visit:advised to f/u 69mo Last Refill:03/03/20(90,5)  Please Advise. Medication pending. Pharmacy requesting 7 day early refill. Pt lost her medication

## 2020-06-14 ENCOUNTER — Observation Stay (HOSPITAL_COMMUNITY)
Admission: EM | Admit: 2020-06-14 | Discharge: 2020-06-15 | Disposition: A | Discharge status: Skilled Nursing Facility | Payer: Medicare Other | Source: Home / Self Care | Attending: Emergency Medicine | Admitting: Emergency Medicine

## 2020-06-14 ENCOUNTER — Other Ambulatory Visit: Payer: Self-pay

## 2020-06-14 ENCOUNTER — Encounter (HOSPITAL_COMMUNITY): Payer: Self-pay | Admitting: *Deleted

## 2020-06-14 ENCOUNTER — Emergency Department (HOSPITAL_COMMUNITY): Payer: Medicare Other

## 2020-06-14 DIAGNOSIS — Z8261 Family history of arthritis: Secondary | ICD-10-CM | POA: Insufficient documentation

## 2020-06-14 DIAGNOSIS — Z9981 Dependence on supplemental oxygen: Secondary | ICD-10-CM | POA: Insufficient documentation

## 2020-06-14 DIAGNOSIS — M797 Fibromyalgia: Secondary | ICD-10-CM | POA: Insufficient documentation

## 2020-06-14 DIAGNOSIS — M4854XA Collapsed vertebra, not elsewhere classified, thoracic region, initial encounter for fracture: Secondary | ICD-10-CM | POA: Insufficient documentation

## 2020-06-14 DIAGNOSIS — R0602 Shortness of breath: Secondary | ICD-10-CM | POA: Insufficient documentation

## 2020-06-14 DIAGNOSIS — S22000A Wedge compression fracture of unspecified thoracic vertebra, initial encounter for closed fracture: Secondary | ICD-10-CM

## 2020-06-14 DIAGNOSIS — Z8719 Personal history of other diseases of the digestive system: Secondary | ICD-10-CM | POA: Insufficient documentation

## 2020-06-14 DIAGNOSIS — F419 Anxiety disorder, unspecified: Secondary | ICD-10-CM | POA: Insufficient documentation

## 2020-06-14 DIAGNOSIS — Z20822 Contact with and (suspected) exposure to covid-19: Secondary | ICD-10-CM | POA: Insufficient documentation

## 2020-06-14 DIAGNOSIS — F0390 Unspecified dementia without behavioral disturbance: Secondary | ICD-10-CM | POA: Insufficient documentation

## 2020-06-14 DIAGNOSIS — M545 Low back pain: Secondary | ICD-10-CM | POA: Insufficient documentation

## 2020-06-14 DIAGNOSIS — Z881 Allergy status to other antibiotic agents status: Secondary | ICD-10-CM | POA: Insufficient documentation

## 2020-06-14 DIAGNOSIS — Z8249 Family history of ischemic heart disease and other diseases of the circulatory system: Secondary | ICD-10-CM | POA: Insufficient documentation

## 2020-06-14 DIAGNOSIS — Z79899 Other long term (current) drug therapy: Secondary | ICD-10-CM | POA: Insufficient documentation

## 2020-06-14 DIAGNOSIS — Z791 Long term (current) use of non-steroidal anti-inflammatories (NSAID): Secondary | ICD-10-CM | POA: Insufficient documentation

## 2020-06-14 DIAGNOSIS — Z825 Family history of asthma and other chronic lower respiratory diseases: Secondary | ICD-10-CM | POA: Insufficient documentation

## 2020-06-14 DIAGNOSIS — I110 Hypertensive heart disease with heart failure: Secondary | ICD-10-CM | POA: Insufficient documentation

## 2020-06-14 DIAGNOSIS — T17320A Food in larynx causing asphyxiation, initial encounter: Secondary | ICD-10-CM | POA: Diagnosis not present

## 2020-06-14 DIAGNOSIS — M1990 Unspecified osteoarthritis, unspecified site: Secondary | ICD-10-CM | POA: Insufficient documentation

## 2020-06-14 DIAGNOSIS — Z90710 Acquired absence of both cervix and uterus: Secondary | ICD-10-CM | POA: Insufficient documentation

## 2020-06-14 DIAGNOSIS — T17928A Food in respiratory tract, part unspecified causing other injury, initial encounter: Secondary | ICD-10-CM | POA: Insufficient documentation

## 2020-06-14 DIAGNOSIS — G4700 Insomnia, unspecified: Secondary | ICD-10-CM | POA: Insufficient documentation

## 2020-06-14 DIAGNOSIS — Z880 Allergy status to penicillin: Secondary | ICD-10-CM | POA: Insufficient documentation

## 2020-06-14 DIAGNOSIS — G4733 Obstructive sleep apnea (adult) (pediatric): Secondary | ICD-10-CM | POA: Insufficient documentation

## 2020-06-14 DIAGNOSIS — N3281 Overactive bladder: Secondary | ICD-10-CM | POA: Insufficient documentation

## 2020-06-14 DIAGNOSIS — M810 Age-related osteoporosis without current pathological fracture: Secondary | ICD-10-CM | POA: Insufficient documentation

## 2020-06-14 DIAGNOSIS — Z841 Family history of disorders of kidney and ureter: Secondary | ICD-10-CM | POA: Insufficient documentation

## 2020-06-14 DIAGNOSIS — J449 Chronic obstructive pulmonary disease, unspecified: Secondary | ICD-10-CM | POA: Insufficient documentation

## 2020-06-14 DIAGNOSIS — Z823 Family history of stroke: Secondary | ICD-10-CM | POA: Insufficient documentation

## 2020-06-14 DIAGNOSIS — X58XXXA Exposure to other specified factors, initial encounter: Secondary | ICD-10-CM | POA: Insufficient documentation

## 2020-06-14 DIAGNOSIS — I5032 Chronic diastolic (congestive) heart failure: Secondary | ICD-10-CM | POA: Insufficient documentation

## 2020-06-14 DIAGNOSIS — R1310 Dysphagia, unspecified: Secondary | ICD-10-CM | POA: Insufficient documentation

## 2020-06-14 DIAGNOSIS — J61 Pneumoconiosis due to asbestos and other mineral fibers: Secondary | ICD-10-CM | POA: Insufficient documentation

## 2020-06-14 DIAGNOSIS — I10 Essential (primary) hypertension: Secondary | ICD-10-CM

## 2020-06-14 DIAGNOSIS — Z7901 Long term (current) use of anticoagulants: Secondary | ICD-10-CM | POA: Insufficient documentation

## 2020-06-14 DIAGNOSIS — Z820 Family history of epilepsy and other diseases of the nervous system: Secondary | ICD-10-CM | POA: Insufficient documentation

## 2020-06-14 DIAGNOSIS — E785 Hyperlipidemia, unspecified: Secondary | ICD-10-CM | POA: Insufficient documentation

## 2020-06-14 DIAGNOSIS — R061 Stridor: Secondary | ICD-10-CM | POA: Insufficient documentation

## 2020-06-14 DIAGNOSIS — Z833 Family history of diabetes mellitus: Secondary | ICD-10-CM | POA: Insufficient documentation

## 2020-06-14 DIAGNOSIS — Z87440 Personal history of urinary (tract) infections: Secondary | ICD-10-CM | POA: Insufficient documentation

## 2020-06-14 DIAGNOSIS — R0989 Other specified symptoms and signs involving the circulatory and respiratory systems: Secondary | ICD-10-CM

## 2020-06-14 DIAGNOSIS — Z86711 Personal history of pulmonary embolism: Secondary | ICD-10-CM | POA: Insufficient documentation

## 2020-06-14 DIAGNOSIS — I700 Atherosclerosis of aorta: Secondary | ICD-10-CM | POA: Insufficient documentation

## 2020-06-14 DIAGNOSIS — N3941 Urge incontinence: Secondary | ICD-10-CM | POA: Insufficient documentation

## 2020-06-14 DIAGNOSIS — G8929 Other chronic pain: Secondary | ICD-10-CM | POA: Insufficient documentation

## 2020-06-14 DIAGNOSIS — I2510 Atherosclerotic heart disease of native coronary artery without angina pectoris: Secondary | ICD-10-CM | POA: Insufficient documentation

## 2020-06-14 DIAGNOSIS — Z7982 Long term (current) use of aspirin: Secondary | ICD-10-CM | POA: Insufficient documentation

## 2020-06-14 LAB — CBC WITH DIFFERENTIAL/PLATELET
Abs Immature Granulocytes: 0.02 10*3/uL (ref 0.00–0.07)
Basophils Absolute: 0 10*3/uL (ref 0.0–0.1)
Basophils Relative: 1 %
Eosinophils Absolute: 0.2 10*3/uL (ref 0.0–0.5)
Eosinophils Relative: 2 %
HCT: 41.4 % (ref 36.0–46.0)
Hemoglobin: 13.5 g/dL (ref 12.0–15.0)
Immature Granulocytes: 0 %
Lymphocytes Relative: 35 %
Lymphs Abs: 2.8 10*3/uL (ref 0.7–4.0)
MCH: 29.7 pg (ref 26.0–34.0)
MCHC: 32.6 g/dL (ref 30.0–36.0)
MCV: 91.2 fL (ref 80.0–100.0)
Monocytes Absolute: 0.6 10*3/uL (ref 0.1–1.0)
Monocytes Relative: 8 %
Neutro Abs: 4.4 10*3/uL (ref 1.7–7.7)
Neutrophils Relative %: 54 %
Platelets: 188 10*3/uL (ref 150–400)
RBC: 4.54 MIL/uL (ref 3.87–5.11)
RDW: 13.4 % (ref 11.5–15.5)
WBC: 8 10*3/uL (ref 4.0–10.5)
nRBC: 0 % (ref 0.0–0.2)

## 2020-06-14 LAB — BASIC METABOLIC PANEL
Anion gap: 9 (ref 5–15)
BUN: 20 mg/dL (ref 8–23)
CO2: 25 mmol/L (ref 22–32)
Calcium: 8.6 mg/dL — ABNORMAL LOW (ref 8.9–10.3)
Chloride: 104 mmol/L (ref 98–111)
Creatinine, Ser: 0.85 mg/dL (ref 0.44–1.00)
GFR calc Af Amer: >60 mL/min (ref >60)
GFR calc non Af Amer: >60 mL/min (ref >60)
Glucose, Bld: 106 mg/dL — ABNORMAL HIGH (ref 70–99)
Potassium: 4.1 mmol/L (ref 3.5–5.1)
Sodium: 138 mmol/L (ref 135–145)

## 2020-06-14 LAB — SARS CORONAVIRUS 2 BY RT PCR (HOSPITAL ORDER, PERFORMED IN ~~LOC~~ HOSPITAL LAB): SARS Coronavirus 2: NEGATIVE

## 2020-06-14 MED ORDER — ACETAMINOPHEN 650 MG RE SUPP: 650.0000 mg | Freq: Four times a day (QID) | RECTAL | Status: DC | PRN

## 2020-06-14 MED ORDER — ALBUTEROL SULFATE HFA 108 (90 BASE) MCG/ACT IN AERS: 1.0000 | INHALATION_SPRAY | Freq: Four times a day (QID) | RESPIRATORY_TRACT | Status: DC | PRN

## 2020-06-14 MED ORDER — ISOSORBIDE MONONITRATE ER 60 MG PO TB24
60.0000 mg | ORAL_TABLET | Freq: Every day | ORAL | Status: DC
  Administered 2020-06-15: 60 mg via ORAL
  Filled 2020-06-14: qty 1

## 2020-06-14 MED ORDER — METOPROLOL SUCCINATE ER 50 MG PO TB24
50.0000 mg | ORAL_TABLET | Freq: Every day | ORAL | Status: DC
  Administered 2020-06-15: 50 mg via ORAL
  Filled 2020-06-14 (×2): qty 1

## 2020-06-14 MED ORDER — ONDANSETRON HCL 4 MG PO TABS: 4.0000 mg | ORAL_TABLET | Freq: Four times a day (QID) | ORAL | Status: DC | PRN

## 2020-06-14 MED ORDER — FLUTICASONE PROPIONATE 50 MCG/ACT NA SUSP
2.0000 | Freq: Every day | NASAL | Status: DC
  Administered 2020-06-15: 2 via NASAL
  Filled 2020-06-14: qty 16

## 2020-06-14 MED ORDER — OXYCODONE HCL 5 MG/5ML PO SOLN
10.0000 mg | Freq: Once | ORAL | Status: AC
  Administered 2020-06-14: 10 mg via ORAL
  Filled 2020-06-14: qty 10

## 2020-06-14 MED ORDER — PANTOPRAZOLE SODIUM 40 MG PO TBEC
40.0000 mg | DELAYED_RELEASE_TABLET | Freq: Every day | ORAL | Status: DC
  Administered 2020-06-15: 40 mg via ORAL
  Filled 2020-06-14 (×2): qty 1

## 2020-06-14 MED ORDER — ALBUTEROL SULFATE (2.5 MG/3ML) 0.083% IN NEBU: 2.5000 mg | INHALATION_SOLUTION | RESPIRATORY_TRACT | Status: DC | PRN

## 2020-06-14 MED ORDER — ONDANSETRON HCL 4 MG/2ML IJ SOLN: 4.0000 mg | Freq: Four times a day (QID) | INTRAMUSCULAR | Status: DC | PRN

## 2020-06-14 MED ORDER — DEXAMETHASONE SODIUM PHOSPHATE 10 MG/ML IJ SOLN
10.0000 mg | Freq: Once | INTRAMUSCULAR | Status: AC
  Administered 2020-06-14: 10 mg via INTRAVENOUS
  Filled 2020-06-14: qty 1

## 2020-06-14 MED ORDER — DULOXETINE HCL 60 MG PO CPEP
60.0000 mg | ORAL_CAPSULE | Freq: Every day | ORAL | Status: DC
  Administered 2020-06-14 – 2020-06-15 (×2): 60 mg via ORAL
  Filled 2020-06-14: qty 1

## 2020-06-14 MED ORDER — ASPIRIN EC 81 MG PO TBEC
81.0000 mg | DELAYED_RELEASE_TABLET | Freq: Every day | ORAL | Status: DC
  Administered 2020-06-14: 81 mg via ORAL
  Filled 2020-06-14: qty 1

## 2020-06-14 MED ORDER — ALBUTEROL SULFATE (2.5 MG/3ML) 0.083% IN NEBU: 2.5000 mg | INHALATION_SOLUTION | Freq: Four times a day (QID) | RESPIRATORY_TRACT | Status: DC | PRN

## 2020-06-14 MED ORDER — TRAZODONE HCL 50 MG PO TABS
50.0000 mg | ORAL_TABLET | Freq: Every evening | ORAL | Status: DC | PRN
  Administered 2020-06-15: 50 mg via ORAL
  Filled 2020-06-14: qty 1

## 2020-06-14 MED ORDER — ALPRAZOLAM 0.5 MG PO TABS
0.5000 mg | ORAL_TABLET | Freq: Three times a day (TID) | ORAL | Status: DC | PRN
  Administered 2020-06-15 (×2): 0.5 mg via ORAL
  Filled 2020-06-14 (×2): qty 1

## 2020-06-14 MED ORDER — LORAZEPAM 2 MG/ML IJ SOLN
1.0000 mg | Freq: Once | INTRAMUSCULAR | Status: AC
  Administered 2020-06-14: 1 mg via INTRAVENOUS
  Filled 2020-06-14: qty 1

## 2020-06-14 MED ORDER — ROSUVASTATIN CALCIUM 20 MG PO TABS
20.0000 mg | ORAL_TABLET | Freq: Every day | ORAL | Status: DC
  Administered 2020-06-15: 20 mg via ORAL
  Filled 2020-06-14: qty 1

## 2020-06-14 MED ORDER — DULOXETINE HCL 30 MG PO CPEP
30.0000 mg | ORAL_CAPSULE | Freq: Every day | ORAL | Status: DC
  Administered 2020-06-15 (×2): 30 mg via ORAL
  Filled 2020-06-14 (×2): qty 1

## 2020-06-14 MED ORDER — DARIFENACIN HYDROBROMIDE ER 7.5 MG PO TB24
7.5000 mg | ORAL_TABLET | Freq: Every day | ORAL | Status: DC
  Administered 2020-06-15: 7.5 mg via ORAL
  Filled 2020-06-14: qty 1

## 2020-06-14 MED ORDER — ACETAMINOPHEN 325 MG PO TABS
650.0000 mg | ORAL_TABLET | Freq: Four times a day (QID) | ORAL | Status: DC | PRN
  Administered 2020-06-15: 650 mg via ORAL
  Filled 2020-06-14: qty 2

## 2020-06-14 MED ORDER — DULOXETINE HCL 60 MG PO CPEP: 60.0000 mg | ORAL_CAPSULE | Freq: Every day | ORAL | Status: DC

## 2020-06-14 MED ORDER — DULOXETINE HCL 30 MG PO CPEP: 30.0000 mg | ORAL_CAPSULE | Freq: Every day | ORAL | Status: DC

## 2020-06-14 MED ORDER — OXYCODONE-ACETAMINOPHEN 10-325 MG PO TABS
1.0000 | ORAL_TABLET | Freq: Four times a day (QID) | ORAL | Status: DC | PRN
  Filled 2020-06-14: qty 1

## 2020-06-14 MED ORDER — ISOSORBIDE MONONITRATE ER 30 MG PO TB24
30.0000 mg | ORAL_TABLET | Freq: Every day | ORAL | Status: DC
  Administered 2020-06-15: 30 mg via ORAL
  Filled 2020-06-14 (×2): qty 1

## 2020-06-14 MED ORDER — LAMOTRIGINE 25 MG PO TABS
150.0000 mg | ORAL_TABLET | Freq: Every day | ORAL | Status: DC
  Administered 2020-06-15: 150 mg via ORAL
  Filled 2020-06-14: qty 1

## 2020-06-14 MED ORDER — DONEPEZIL HCL 10 MG PO TABS
10.0000 mg | ORAL_TABLET | Freq: Every day | ORAL | Status: DC
  Administered 2020-06-15: 10 mg via ORAL
  Filled 2020-06-14: qty 1

## 2020-06-14 MED ORDER — ADULT MULTIVITAMIN W/MINERALS CH
1.0000 | ORAL_TABLET | Freq: Every day | ORAL | Status: DC
  Administered 2020-06-15: 1 via ORAL
  Filled 2020-06-14 (×2): qty 1

## 2020-06-14 NOTE — ED Notes (Signed)
ED TO INPATIENT HANDOFF REPORT  Name/Age/Gender Linda [name] 74 y.o. female  Code Status    Code Status Orders  (From admission, onward)         Start     Ordered   06/14/20 1910  Full code  Continuous        06/14/20 1909        Code Status History    Date Active Date Inactive Code Status Order ID Comments User Context   01/21/2020 0028 01/22/2020 1858 Partial Code [id number]  Shela Leff, MD Inpatient   01/20/2020 2323 01/21/2020 0028 Full Code [id number]  Shela Leff, MD ED   10/04/2019 2043 10/07/2019 1703 Full Code [id number]  Merton Border, MD Inpatient   10/04/2019 1752 10/04/2019 2043 Full Code [id number]  Merton Border, MD ED   01/01/2018 1613 01/06/2018 2052 Full Code [id number]  Radene Gunning, NP ED   10/21/2017 0528 10/23/2017 1845 Full Code [id number]  Jani Gravel, MD ED   12/04/2016 1902 12/06/2016 2026 DNR [id number]  Mariel Aloe, MD Inpatient   09/19/2016 1738 09/23/2016 1730 Full Code [id number]  Lonn Georgia, PA-C Inpatient   05/30/2016 1244 06/06/2016 1738 Full Code [id number]  Almyra Deforest, [place] Inpatient   04/08/2016 1718 04/11/2016 1916 Full Code [id number]  Isaiah Serge, NP Inpatient   01/01/2016 2149 01/03/2016 2024 Full Code [id number]  Nat Math, MD Inpatient   10/16/2015 1306 10/20/2015 1830 Full Code [id number]  Burtis Junes, NP Inpatient   07/26/2014 1545 08/08/2014 1721 Full Code [id number]  Bary Leriche, PA-C Inpatient   07/17/2014 2248 07/26/2014 1545 Full Code [id number]  Rise Patience, MD Inpatient   04/24/2014 0140 04/28/2014 1635 Full Code [id number]  Theressa Millard, MD Inpatient   07/20/2013 1919 07/26/2013 2028 Full Code [id number]  Orson Eva, MD Inpatient   07/12/2013 1726 07/19/2013 1631 Full Code [id number]  Mendel Corning, MD Inpatient   Advance Care Planning Activity    Advance Directive Documentation     Most Recent Value  Type of Advance Directive Out of facility DNR (pink MOST or yellow form), Living will  [pt states she has a  dnr]  Pre-existing out of facility DNR order (yellow form or pink MOST form) --  "MOST" Form in Place? --      Home/SNF/Other Home  Chief Complaint Choking due to food (regurgitated) [T17.320A]  Level of Care/Admitting Diagnosis ED Disposition    ED Disposition Condition [place]: [place] [[id number]]  Level of Care: Med-Surg [16]  Covid Evaluation: Confirmed COVID Negative  Diagnosis: Choking due to food (regurgitated) [[id number]]  Admitting Physician: Lang Snow [[id number]]  Attending Physician: Lang Snow [[id number]]       Medical History Past Medical History:  Diagnosis Date  . Acute upper GI bleed 06/2014   while pt taking coumadin, plavix, and meloxicam---despite being told not to take coumadin.  . Anginal pain ([place])    Nonobstructive CAD 2014; however, her cardiologist put her on a statin for this and NOT for hyperlipidemia per pt report.  Atyp CP 08/2017 at card f/u, plan for myoc perf imaging.  . Anxiety    panic attacks  . Asthma    w/ asbestososis   . BPPV (benign paroxysmal positional vertigo) 12/16/2012  . Chronic diastolic CHF (congestive heart failure) (HCC)    dry wt as of 11/06/16 is 168 lbs.  . Chronic lower back pain   . COPD (chronic obstructive pulmonary disease) ([place])   .  DDD (degenerative disc disease)    lumbar and cervical.   . Diverticular disease   . Fibromyalgia    Patient states dx was around her late 74s but she had sx's for years prior to this.  . H/O hiatal hernia   . History of pneumonia    hospitalized 12/2011, 02/2013, and 07/2013 [facility]) for this  . HTN (hypertension)    Renal artery dopplers 04/2013 neg for stenosis.  . Hypervitaminosis D 09/27/2019   over-supplemented.  Stopped vit D and plan recheck 2 mo.  . Idiopathic angio-edema-urticaria [id number]; 2021   Angioedema component was very minimal.  2021->Dr. Bobbitt (allergist) eval.  . Insomnia   . Iron deficiency anemia    Hematologist in  [place], [place] did extensive w/u; no cause found; failed oral supplement;; gets fairly regular (q31m or so) IV iron infusions (Venofer -iron sucrose- 200mg  with procrit.  "for 14 yr I've been getting blood work q month & getting infusions prn" (07/12/2013).  Dr. Marin Olp locally, iron infusions done, EPO deficiency dx'd  . Migraine syndrome    "not as often anymore; used to be ~ q wk" (07/12/2013)  . Mixed incontinence urge and stress   . Nephrolithiasis    "passed all on my own or they are still in there" (07/12/2013)  . Neuroleptic induced Parkinsonism ([place]) 2018   Dr. Carles Collet, neuro, saw her 11/24/17 and recommended d/c of abilify as first step.  D/c'd abilify and pt got complete recovery.  . OSA on CPAP    prior to move to [place]--had another sleep study 10/2015 w/pulm Dr. Camillo Flaming.  . Osteoarthritis    "severe; progressing fast" (07/12/2013); multiple joints-not surgical candidate for TKR (03/2015).  Triamcinolon knee injections by Dr. Tessa Lerner 12/2017.  [name] Kitchen Pernicious anemia 08/24/2014  . Pleural plaque with presence of asbestos 07/22/2013  . Pulmonary embolism ([place]) 07/2013   Dx at [facility] with very small peripheral upper lobe pe 07/2013: pt took coumadin for about 8-9 mo  . Pyelonephritis    "several times over the last 30 yr" (07/12/2013)  . RBBB (right bundle branch block)   . Recurrent major depression ([place])   . Recurrent UTI    hx of hospitalization for pyelonephritis; started abx prophylaxis 06/2015  . Syncope    Hypotensive; ED visit--Dr. Terrence Dupont did Cath--nonobstructive CAD, EF 55-60%.  In retrospect, suspect pt rx med misuse/polypharmacy    Allergies Allergies  Allergen Reactions  . Abilify [Aripiprazole] Other (See Comments)    parkinsonism  . Penicillins Itching, Swelling and Rash    Tolerated Cefepime in ED. Has patient had a PCN reaction causing immediate rash, facial/tongue/throat swelling, SOB or lightheadedness with hypotension: Yes Has patient had a PCN reaction causing severe rash involving  mucus membranes or skin necrosis: No Has patient had a PCN reaction that required hospitalization: No  Has patient had a PCN reaction occurring within the last 10 years: No     IV Location/Drains/Wounds Patient Lines/Drains/Airways Status    Active Line/Drains/Airways    Name Placement date Placement time Site Days   Peripheral IV 06/14/20 Right Hand 06/14/20  1402  Hand  less than 1          Labs/Imaging Results for orders placed or performed during the hospital encounter of 06/14/20 (from the past 48 hour(s))  SARS Coronavirus 2 by RT PCR (hospital order, performed in [facility] hospital lab) Nasopharyngeal Nasopharyngeal Swab     Status: None   Collection Time: 06/14/20  2:03 PM   Specimen: Nasopharyngeal Swab  Result Value Ref Range   SARS Coronavirus 2 NEGATIVE NEGATIVE    Comment: (NOTE) SARS-CoV-2 target nucleic acids are NOT DETECTED.  The SARS-CoV-2 RNA is generally detectable in upper and lower respiratory specimens during the acute phase of infection. The lowest concentration of SARS-CoV-2 viral copies this assay can detect is 250 copies / mL. A negative result does not preclude SARS-CoV-2 infection and should not be used as the sole basis for treatment or other patient management decisions.  A negative result may occur with improper specimen collection / handling, submission of specimen other than nasopharyngeal swab, presence of viral mutation(s) within the areas targeted by this assay, and inadequate number of viral copies (<250 copies / mL). A negative result must be combined with clinical observations, patient history, and epidemiological information.  Fact Sheet for Patients:   StrictlyIdeas.no  Fact Sheet for Healthcare Providers: BankingDealers.co.za  This test is not yet approved or  cleared by the [place] FDA and has been authorized for detection and/or diagnosis of SARS-CoV-2 by FDA under an  Emergency Use Authorization (EUA).  This EUA will remain in effect (meaning this test can be used) for the duration of the COVID-19 declaration under Section 564(b)(1) of the Act, 21 U.S.C. section 360bbb-3(b)(1), unless the authorization is terminated or revoked sooner.  Performed at [facility], [place] [address], [place], [name] [address]   CBC with Differential/Platelet     Status: None   Collection Time: 06/14/20  2:50 PM  Result Value Ref Range   WBC 8.0 4.0 - 10.5 K/uL   RBC 4.54 3.87 - 5.11 MIL/uL   Hemoglobin 13.5 12.0 - 15.0 g/dL   HCT 41.4 36 - 46 %   MCV 91.2 80.0 - 100.0 fL   MCH 29.7 26.0 - 34.0 pg   MCHC 32.6 30.0 - 36.0 g/dL   RDW 13.4 11.5 - 15.5 %   Platelets 188 150 - 400 K/uL   nRBC 0.0 0.0 - 0.2 %   Neutrophils Relative % 54 %   Neutro Abs 4.4 1.7 - 7.7 K/uL   Lymphocytes Relative 35 %   Lymphs Abs 2.8 0.7 - 4.0 K/uL   Monocytes Relative 8 %   Monocytes Absolute 0.6 0 - 1 K/uL   Eosinophils Relative 2 %   Eosinophils Absolute 0.2 0 - 0 K/uL   Basophils Relative 1 %   Basophils Absolute 0.0 0 - 0 K/uL   Immature Granulocytes 0 %   Abs Immature Granulocytes 0.02 0.00 - 0.07 K/uL    Comment: Performed at [facility], [place] [address], [place], [place] [address]  Basic metabolic panel     Status: Abnormal   Collection Time: 06/14/20  2:50 PM  Result Value Ref Range   Sodium 138 135 - 145 mmol/L   Potassium 4.1 3.5 - 5.1 mmol/L   Chloride 104 98 - 111 mmol/L   CO2 25 22 - 32 mmol/L   Glucose, Bld 106 (H) 70 - 99 mg/dL    Comment: Glucose reference range applies only to samples taken after fasting for at least 8 hours.   BUN 20 8 - 23 mg/dL   Creatinine, Ser 0.85 0.44 - 1.00 mg/dL   Calcium 8.6 (L) 8.9 - 10.3 mg/dL   GFR calc non Af Amer >60 >60 mL/min   GFR calc Af Amer >60 >60 mL/min   Anion gap 9 5 - 15    Comment: Performed at [facility], [address] [place], [place] [address]   *Note:  Due to a large number of results and/or encounters for the requested time period, some results have not been displayed. A complete set of results can be found in Results Review.   DG Neck Soft Tissue  Result Date: 06/14/2020 CLINICAL DATA:  Choking episode. EXAM: NECK SOFT TISSUES - 1+ VIEW COMPARISON:  Chest x-ray dated December 06, 2019. CT cervical spine dated October 21, 2017. FINDINGS: There is no evidence of retropharyngeal soft tissue swelling or epiglottic enlargement. The cervical airway is unremarkable and no radio-opaque foreign body identified. Bilateral carotid artery calcific atherosclerosis. Bilateral calcified pleural plaques in the lungs again noted. IMPRESSION: 1. Negative.  No radiopaque foreign body identified. Electronically Signed   By: Titus Dubin M.D.   On: 06/14/2020 14:39   CT CHEST WO CONTRAST  Result Date: 06/14/2020 CLINICAL DATA:  74 year old female with history of aspiration. EXAM: CT CHEST WITHOUT CONTRAST TECHNIQUE: Multidetector CT imaging of the chest was performed following the standard protocol without IV contrast. COMPARISON:  Chest CT 01/01/2016. FINDINGS: Cardiovascular: Heart size is normal. There is no significant pericardial fluid, thickening or pericardial calcification. There is aortic atherosclerosis, as well as atherosclerosis of the great vessels of the mediastinum and the coronary arteries, including calcified atherosclerotic plaque in the left main, left anterior descending, left circumflex and right coronary arteries. Mediastinum/Nodes: No pathologically enlarged mediastinal or hilar lymph nodes. Please note that accurate exclusion of hilar adenopathy is limited on noncontrast CT scans. Esophagus is unremarkable in appearance. No axillary lymphadenopathy. Lungs/Pleura: Numerous calcified pleural plaques bilaterally, indicative of asbestos related pleural disease. No pleural effusions. No suspicious appearing pulmonary nodules or masses are noted. No  acute consolidative airspace disease. Some peripheral predominant areas of ground-glass attenuation and parenchymal banding throughout the mid to lower lungs bilaterally. No honeycombing. Upper Abdomen: Aortic atherosclerosis. Musculoskeletal: New compression fractures of T7 and T9, most severe at T9 with 40% loss of anterior vertebral body height. There are no aggressive appearing lytic or blastic lesions noted in the visualized portions of the skeleton. IMPRESSION: 1. No imaging findings to suggest significant aspiration. 2. Asbestos related pleural disease with imaging findings in the lung bases bilaterally concerning for developing interstitial lung disease such as asbestosis. Outpatient referral to Pulmonology for appropriate follow-up is recommended. 3. Aortic atherosclerosis, in addition to left main and 3 vessel coronary artery disease. Assessment for potential risk factor modification, dietary therapy or pharmacologic therapy may be warranted, if clinically indicated. 4. New compression fractures of T7 and T9, most severe at T9 where there is 40% loss of anterior vertebral body height. Aortic Atherosclerosis (ICD10-I70.0). Electronically Signed   By: Vinnie Langton M.D.   On: 06/14/2020 16:17   DG Chest Portable 1 View  Result Date: 06/14/2020 CLINICAL DATA:  Choking episode today. EXAM: PORTABLE CHEST 1 VIEW COMPARISON:  Single-view of the chest 12/06/2019. FINDINGS: Calcified pleural plaques are again seen. Lungs are clear. Heart size is enlarged. Aortic atherosclerosis. No pneumothorax or pleural fluid. IMPRESSION: No acute disease. Calcified pleural plaques consistent with prior asbestos exposure. Cardiomegaly. Aortic Atherosclerosis (ICD10-I70.0). Electronically Signed   By: Inge Rise M.D.   On: 06/14/2020 14:37    Pending Labs Unresulted Labs (From admission, onward) Comment          Start     Ordered   06/15/20 9629  Basic metabolic panel  Tomorrow morning,   R        06/14/20  1909   06/15/20 0500  CBC  Tomorrow morning,   R        06/14/20 1909          Vitals/Pain Today's Vitals   06/14/20 2100 06/14/20 2130 06/14/20 2138 06/14/20 2200  BP: (!) 157/74 (!) 146/74  (!) 143/66  Pulse: 65 68  66  Resp: 15 11  13   Temp:      TempSrc:      SpO2: 95% 93%  91%  Weight:      Height:      PainSc:   4      Isolation Precautions No active isolations  Medications Medications  aspirin EC tablet 81 mg (has no administration in time range)  oxyCODONE-acetaminophen (PERCOCET) 10-325 MG per tablet 1 tablet (has no administration in time range)  isosorbide mononitrate (IMDUR) 24 hr tablet 30 mg (has no administration in time range)  metoprolol succinate (TOPROL-XL) 24 hr tablet 50 mg (50 mg Oral Refused 06/14/20 1926)  rosuvastatin (CRESTOR) tablet 20 mg (has no administration in time range)  ALPRAZolam (XANAX) tablet 0.5 mg (has no administration in time range)  donepezil (ARICEPT) tablet 10 mg (has no administration in time range)  DULoxetine (CYMBALTA) DR capsule 30 mg (has no administration in time range)  DULoxetine (CYMBALTA) DR capsule 60 mg (has no administration in time range)  traZODone (DESYREL) tablet 50 mg (has no administration in time range)  pantoprazole (PROTONIX) EC tablet 40 mg (40 mg Oral Refused 06/14/20 1927)  darifenacin (ENABLEX) 24 hr tablet 7.5 mg (has no administration in time range)  lamoTRIgine (LAMICTAL) tablet 150 mg (has no administration in time range)  multivitamin with minerals tablet 1 tablet (1 tablet Oral Refused 06/14/20 1927)  albuterol (VENTOLIN HFA) 108 (90 Base) MCG/ACT inhaler 1-2 puff (has no administration in time range)  fluticasone (FLONASE) 50 MCG/ACT nasal spray 2 spray (has no administration in time range)  acetaminophen (TYLENOL) tablet 650 mg (has no administration in time range)    Or  acetaminophen (TYLENOL) suppository 650 mg (has no administration in time range)  ondansetron (ZOFRAN) tablet 4 mg (has no  administration in time range)    Or  ondansetron (ZOFRAN) injection 4 mg (has no administration in time range)  albuterol (PROVENTIL) (2.5 MG/3ML) 0.083% nebulizer solution 2.5 mg (has no administration in time range)  isosorbide mononitrate (IMDUR) 24 hr tablet 60 mg (has no administration in time range)  LORazepam (ATIVAN) injection 1 mg (1 mg Intravenous Given 06/14/20 1524)  dexamethasone (DECADRON) injection 10 mg (10 mg Intravenous Given 06/14/20 1729)  oxyCODONE (ROXICODONE) 5 MG/5ML solution 10 mg (10 mg Oral Given 06/14/20 2036)    Mobility walks

## 2020-06-14 NOTE — Consult Note (Signed)
Reason for Consult: Breathing difficulty, possible upper airway obstruction  HPI:  Linda [name] is an 74 y.o. female who presents to the [facility] today after a choking episode. Abdominal thrusts were performed. She continues to have shortness of breath. No oxygen desaturation. ENT consulted for upper airway evaluation.  Past Medical History:  Diagnosis Date  . Acute upper GI bleed 06/2014   while pt taking coumadin, plavix, and meloxicam---despite being told not to take coumadin.  . Anginal pain ([place])    Nonobstructive CAD 2014; however, her cardiologist put her on a statin for this and NOT for hyperlipidemia per pt report.  Atyp CP 08/2017 at card f/u, plan for myoc perf imaging.  . Anxiety    panic attacks  . Asthma    w/ asbestososis   . BPPV (benign paroxysmal positional vertigo) 12/16/2012  . Chronic diastolic CHF (congestive heart failure) (HCC)    dry wt as of 11/06/16 is 168 lbs.  . Chronic lower back pain   . COPD (chronic obstructive pulmonary disease) ([place])   . DDD (degenerative disc disease)    lumbar and cervical.   . Diverticular disease   . Fibromyalgia    Patient states dx was around her late 72s but she had sx's for years prior to this.  . H/O hiatal hernia   . History of pneumonia    hospitalized 12/2011, 02/2013, and 07/2013 [facility]) for this  . HTN (hypertension)    Renal artery dopplers 04/2013 neg for stenosis.  . Hypervitaminosis D 09/27/2019   over-supplemented.  Stopped vit D and plan recheck 2 mo.  . Idiopathic angio-edema-urticaria [id number]; 2021   Angioedema component was very minimal.  2021->Dr. Bobbitt (allergist) eval.  . Insomnia   . Iron deficiency anemia    Hematologist in [place], [place] did extensive w/u; no cause found; failed oral supplement;; gets fairly regular (q78m or so) IV iron infusions (Venofer -iron sucrose- 200mg  with procrit.  "for 14 yr I've been getting blood work q month & getting infusions prn" (07/12/2013).  Dr. Marin Olp locally,  iron infusions done, EPO deficiency dx'd  . Migraine syndrome    "not as often anymore; used to be ~ q wk" (07/12/2013)  . Mixed incontinence urge and stress   . Nephrolithiasis    "passed all on my own or they are still in there" (07/12/2013)  . Neuroleptic induced Parkinsonism ([place]) 2018   Dr. Carles Collet, neuro, saw her 11/24/17 and recommended d/c of abilify as first step.  D/c'd abilify and pt got complete recovery.  . OSA on CPAP    prior to move to [place]--had another sleep study 10/2015 w/pulm Dr. Camillo Flaming.  . Osteoarthritis    "severe; progressing fast" (07/12/2013); multiple joints-not surgical candidate for TKR (03/2015).  Triamcinolon knee injections by Dr. Tessa Lerner 12/2017.  [name] Kitchen Pernicious anemia 08/24/2014  . Pleural plaque with presence of asbestos 07/22/2013  . Pulmonary embolism ([place]) 07/2013   Dx at [facility] with very small peripheral upper lobe pe 07/2013: pt took coumadin for about 8-9 mo  . Pyelonephritis    "several times over the last 30 yr" (07/12/2013)  . RBBB (right bundle branch block)   . Recurrent major depression ([place])   . Recurrent UTI    hx of hospitalization for pyelonephritis; started abx prophylaxis 06/2015  . Syncope    Hypotensive; ED visit--Dr. Terrence Dupont did Cath--nonobstructive CAD, EF 55-60%.  In retrospect, suspect pt rx med misuse/polypharmacy    Past Surgical History:  Procedure Laterality Date  .  APPENDECTOMY  1960  . AXILLARY SURGERY Left 1978   Multiple "lump" in armpit per pt  . CARDIAC CATHETERIZATION  01/2013   nonobstructive CAD, EF 55-60%  . CARDIOVASCULAR STRESS TEST  02/22/15   Low risk myocard perf imaging; wall motion normal, normal EF  . carotid duplex doppler  10/21/2017   R vertebral flow suggestive of possible distal obstruction.  Pt declined further w/u as of 10/29/17 but need to revisit this problem periodically.  . COCCYX REMOVAL  1972  . DEXA  06/05/2017   T-score -3.1  . DILATION AND CURETTAGE OF UTERUS  ? 1970's  . ESOPHAGOGASTRODUODENOSCOPY N/A  07/19/2014   Gastritis found + in the setting of supratherapeutic INR, +plavix, + meloxicam.  . EYE SURGERY Left 2012-2013   "injections for ~ 1 yr; don't really know what for" (07/12/2013)  . HEEL SPUR SURGERY Left 2008  . KNEE SURGERY  2005  . LEFT HEART CATHETERIZATION WITH CORONARY ANGIOGRAM N/A 01/30/2013   Procedure: LEFT HEART CATHETERIZATION WITH CORONARY ANGIOGRAM;  Surgeon: Clent Demark, MD;  Location: [facility] CATH LAB;  Service: Cardiovascular;  Laterality: N/A;  . PLANTAR FASCIA RELEASE Left 2008  . SPIROMETRY  04/25/14   In hosp for acute asthma/COPD flare: mixed obstructive and restrictive lung disease. The FEV1 is severely reduced at 45% predicted.  FEV1 signif decreased compared to prior spirometry 07/23/13.  . TENDON RELEASE  1996   Right forearm and hand  . TOTAL ABDOMINAL HYSTERECTOMY  1974  . TRANSTHORACIC ECHOCARDIOGRAM  01/2013; 04/2014;08/2015; 09/2017   2014--NORMAL.  2015--focal basal septal hypertrophy, EF 55-60%, grade I diast dysfxn, mild LAE.  08/2015 EF 55-60%, nl LV syst fxn, grade I DD, valves wnl. 10/21/17: EF 65-70%, grd I DD, o/w normal.    Family History  Problem Relation Age of Onset  . Arthritis Mother   . Kidney disease Mother   . Heart disease Father   . Stroke Father   . Hypertension Father   . Diabetes Father   . Heart attack Father   . Heart attack Paternal Grandmother   . Diabetes Sister        one sister  . Hypertension Sister   . Asthma Sister   . Hypertension Brother   . Asthma Brother   . Asthma Daughter   . Multiple sclerosis Son     Social History:  reports that she has never smoked. She has never used smokeless tobacco. She reports that she does not drink alcohol and does not use drugs.  Allergies:  Allergies  Allergen Reactions  . Abilify [Aripiprazole] Other (See Comments)    parkinsonism  . Penicillins Itching, Swelling and Rash    Tolerated Cefepime in ED. Has patient had a PCN reaction causing immediate rash,  facial/tongue/throat swelling, SOB or lightheadedness with hypotension: Yes Has patient had a PCN reaction causing severe rash involving mucus membranes or skin necrosis: No Has patient had a PCN reaction that required hospitalization: No  Has patient had a PCN reaction occurring within the last 10 years: No     Prior to Admission medications   Medication Sig Start Date End Date Taking? Authorizing Provider  albuterol (VENTOLIN HFA) 108 (90 Base) MCG/ACT inhaler Inhale 1-2 puffs into the lungs every 6 (six) hours as needed for wheezing or shortness of breath. 12/06/19   McGowen, Adrian Blackwater, MD  alendronate (FOSAMAX) 70 MG tablet Take 1 tablet (70 mg total) by mouth every 7 (seven) days. Take with a full glass of water on  an empty stomach, first thing in the morning and remain up right for 30 minutes after taking. 07/26/19   McGowen, Adrian Blackwater, MD  ALPRAZolam Duanne Moron) 1 MG tablet TAKE 1 TABLET THREE TIMES DAILY AS NEEDED FOR ANXIETY. 05/29/20   McGowen, Adrian Blackwater, MD  aspirin 81 MG tablet Take 81 mg by mouth at bedtime.    [provider]  cyclobenzaprine (FLEXERIL) 5 MG tablet Take 1 tablet (5 mg total) by mouth 3 (three) times daily as needed for muscle spasms. 05/24/20   Meredith Staggers, MD  diclofenac sodium (VOLTAREN) 1 % GEL Apply 2 g topically 4 (four) times daily. Patient taking differently: Apply 2 g topically 4 (four) times daily as needed.  10/20/19   Meredith Staggers, MD  donepezil (ARICEPT) 10 MG tablet Take 1 tablet (10 mg total) by mouth at bedtime. 01/31/20   McGowen, Adrian Blackwater, MD  DULoxetine (CYMBALTA) 30 MG capsule TAKE 1 CAPSULE ONCE DAILY ALONG WITH 60MG  CAPSULE FOR TOTAL 90MG  DAILY. 04/17/20   McGowen, Adrian Blackwater, MD  DULoxetine (CYMBALTA) 60 MG capsule TAKE 1 CAPSULE A DAY TO BE COMBINED WITH 30MG  CAPSULE Patient taking differently: Take 60 mg by mouth daily. Take with 30mg  to total 90mg  daily. 07/27/19   McGowen, Adrian Blackwater, MD  EPINEPHrine 0.3 mg/0.3 mL IJ SOAJ injection Inject 0.3  mLs (0.3 mg total) into the muscle as needed for anaphylaxis. 01/22/20   Geradine Girt, DO  fluconazole (DIFLUCAN) 100 MG tablet TAKE (1) TABLET DAILY AS NEEDED FOR THRUSH 05/03/20   McGowen, Adrian Blackwater, MD  fluticasone (FLONASE) 50 MCG/ACT nasal spray Place 2 sprays into both nostrils daily. 09/27/19   McGowen, Adrian Blackwater, MD  furosemide (LASIX) 40 MG tablet Take 1 tablet (40 mg total) by mouth daily as needed for fluid. 10/07/19   Cherylann Ratel A, DO  ipratropium (ATROVENT) 0.03 % nasal spray Place 2 sprays into both nostrils every 12 (twelve) hours. 09/27/19   McGowen, Adrian Blackwater, MD  isosorbide mononitrate (IMDUR) 30 MG 24 hr tablet TAKE 2 TABLETS IN THE MORNING AND 1 TABLET IN THE EVENING. 05/24/20   Lelon Perla, MD  lamoTRIgine (LAMICTAL) 150 MG tablet TAKE 1 TABLET EACH DAY. 11/22/19   McGowen, Adrian Blackwater, MD  metoprolol succinate (TOPROL-XL) 50 MG 24 hr tablet TAKE (1) TABLET DAILY IN THE MORNING. Patient taking differently: Take 50 mg by mouth daily.  07/27/19   McGowen, Adrian Blackwater, MD  minocycline (MINOCIN) 50 MG capsule Take 50 mg by mouth 2 (two) times daily. 10/26/19   [provider]  Multiple Vitamin (MULTIVITAMIN WITH MINERALS) TABS tablet Take 1 tablet by mouth daily.    [provider]  nitroGLYCERIN (NITROSTAT) 0.4 MG SL tablet Place 1 tablet (0.4 mg total) under the tongue every 5 (five) minutes as needed for chest pain (x 3 doses). 09/23/19   Lelon Perla, MD  ondansetron (ZOFRAN) 8 MG tablet Take 1 tablet (8 mg total) by mouth every 8 (eight) hours as needed for nausea or vomiting. 10/22/19   Tammi Sou, MD  oxyCODONE-acetaminophen (PERCOCET) 10-325 MG tablet Every 6 hours as needed 05/24/20   Meredith Staggers, MD  pantoprazole (PROTONIX) 40 MG tablet TAKE 1 TABLET BY MOUTH DAILY. 12/10/19   McGowen, Adrian Blackwater, MD  rosuvastatin (CRESTOR) 20 MG tablet Take 1 tablet (20 mg total) by mouth daily. 09/23/19 02/28/28  Lelon Perla, MD  solifenacin (VESICARE) 10 MG  tablet Take 1 tablet (10 mg total) by  mouth daily. 04/28/20   McGowen, Adrian Blackwater, MD  traZODone (DESYREL) 50 MG tablet TAKE 2-4 TABLETS AT BEDTIME 12/10/19   McGowen, Adrian Blackwater, MD  Vitamin D, Ergocalciferol, (DRISDOL) 1.25 MG (50000 UT) CAPS capsule TAKE (1) CAPSULE TWICE A WEEK. 07/27/19   McGowen, Adrian Blackwater, MD    Results for orders placed or performed during the hospital encounter of 06/14/20 (from the past 48 hour(s))  SARS Coronavirus 2 by RT PCR (hospital order, performed in [facility] hospital lab) Nasopharyngeal Nasopharyngeal Swab     Status: None   Collection Time: 06/14/20  2:03 PM   Specimen: Nasopharyngeal Swab  Result Value Ref Range   SARS Coronavirus 2 NEGATIVE NEGATIVE    Comment: (NOTE) SARS-CoV-2 target nucleic acids are NOT DETECTED.  The SARS-CoV-2 RNA is generally detectable in upper and lower respiratory specimens during the acute phase of infection. The lowest concentration of SARS-CoV-2 viral copies this assay can detect is 250 copies / mL. A negative result does not preclude SARS-CoV-2 infection and should not be used as the sole basis for treatment or other patient management decisions.  A negative result may occur with improper specimen collection / handling, submission of specimen other than nasopharyngeal swab, presence of viral mutation(s) within the areas targeted by this assay, and inadequate number of viral copies (<250 copies / mL). A negative result must be combined with clinical observations, patient history, and epidemiological information.  Fact Sheet for Patients:   StrictlyIdeas.no  Fact Sheet for Healthcare Providers: BankingDealers.co.za  This test is not yet approved or  cleared by the [place] FDA and has been authorized for detection and/or diagnosis of SARS-CoV-2 by FDA under an Emergency Use Authorization (EUA).  This EUA will remain in effect (meaning this test can be used) for the  duration of the COVID-19 declaration under Section 564(b)(1) of the Act, 21 U.S.C. section 360bbb-3(b)(1), unless the authorization is terminated or revoked sooner.  Performed at [facility], [place] [address] [place], [place] [address]   CBC with Differential/Platelet     Status: None   Collection Time: 06/14/20  2:50 PM  Result Value Ref Range   WBC 8.0 4.0 - 10.5 K/uL   RBC 4.54 3.87 - 5.11 MIL/uL   Hemoglobin 13.5 12.0 - 15.0 g/dL   HCT 41.4 36 - 46 %   MCV 91.2 80.0 - 100.0 fL   MCH 29.7 26.0 - 34.0 pg   MCHC 32.6 30.0 - 36.0 g/dL   RDW 13.4 11.5 - 15.5 %   Platelets 188 150 - 400 K/uL   nRBC 0.0 0.0 - 0.2 %   Neutrophils Relative % 54 %   Neutro Abs 4.4 1.7 - 7.7 K/uL   Lymphocytes Relative 35 %   Lymphs Abs 2.8 0.7 - 4.0 K/uL   Monocytes Relative 8 %   Monocytes Absolute 0.6 0 - 1 K/uL   Eosinophils Relative 2 %   Eosinophils Absolute 0.2 0 - 0 K/uL   Basophils Relative 1 %   Basophils Absolute 0.0 0 - 0 K/uL   Immature Granulocytes 0 %   Abs Immature Granulocytes 0.02 0.00 - 0.07 K/uL    Comment: Performed at [facility], [place] [address], [place], [place] [address]  Basic metabolic panel     Status: Abnormal   Collection Time: 06/14/20  2:50 PM  Result Value Ref Range   Sodium 138 135 - 145 mmol/L   Potassium 4.1 3.5 - 5.1 mmol/L  Chloride 104 98 - 111 mmol/L   CO2 25 22 - 32 mmol/L   Glucose, Bld 106 (H) 70 - 99 mg/dL    Comment: Glucose reference range applies only to samples taken after fasting for at least 8 hours.   BUN 20 8 - 23 mg/dL   Creatinine, Ser 0.85 0.44 - 1.00 mg/dL   Calcium 8.6 (L) 8.9 - 10.3 mg/dL   GFR calc non Af Amer >60 >60 mL/min   GFR calc Af Amer >60 >60 mL/min   Anion gap 9 5 - 15    Comment: Performed at [facility], [place] [address], [place], [place] [address]   *Note: Due to a large number of results and/or encounters for the requested time period, some results have not been  displayed. A complete set of results can be found in Results Review.    DG Neck Soft Tissue  Result Date: 06/14/2020 CLINICAL DATA:  Choking episode. EXAM: NECK SOFT TISSUES - 1+ VIEW COMPARISON:  Chest x-ray dated December 06, 2019. CT cervical spine dated October 21, 2017. FINDINGS: There is no evidence of retropharyngeal soft tissue swelling or epiglottic enlargement. The cervical airway is unremarkable and no radio-opaque foreign body identified. Bilateral carotid artery calcific atherosclerosis. Bilateral calcified pleural plaques in the lungs again noted. IMPRESSION: 1. Negative.  No radiopaque foreign body identified. Electronically Signed   By: Titus Dubin M.D.   On: 06/14/2020 14:39   CT CHEST WO CONTRAST  Result Date: 06/14/2020 CLINICAL DATA:  74 year old female with history of aspiration. EXAM: CT CHEST WITHOUT CONTRAST TECHNIQUE: Multidetector CT imaging of the chest was performed following the standard protocol without IV contrast. COMPARISON:  Chest CT 01/01/2016. FINDINGS: Cardiovascular: Heart size is normal. There is no significant pericardial fluid, thickening or pericardial calcification. There is aortic atherosclerosis, as well as atherosclerosis of the great vessels of the mediastinum and the coronary arteries, including calcified atherosclerotic plaque in the left main, left anterior descending, left circumflex and right coronary arteries. Mediastinum/Nodes: No pathologically enlarged mediastinal or hilar lymph nodes. Please note that accurate exclusion of hilar adenopathy is limited on noncontrast CT scans. Esophagus is unremarkable in appearance. No axillary lymphadenopathy. Lungs/Pleura: Numerous calcified pleural plaques bilaterally, indicative of asbestos related pleural disease. No pleural effusions. No suspicious appearing pulmonary nodules or masses are noted. No acute consolidative airspace disease. Some peripheral predominant areas of ground-glass attenuation and  parenchymal banding throughout the mid to lower lungs bilaterally. No honeycombing. Upper Abdomen: Aortic atherosclerosis. Musculoskeletal: New compression fractures of T7 and T9, most severe at T9 with 40% loss of anterior vertebral body height. There are no aggressive appearing lytic or blastic lesions noted in the visualized portions of the skeleton. IMPRESSION: 1. No imaging findings to suggest significant aspiration. 2. Asbestos related pleural disease with imaging findings in the lung bases bilaterally concerning for developing interstitial lung disease such as asbestosis. Outpatient referral to Pulmonology for appropriate follow-up is recommended. 3. Aortic atherosclerosis, in addition to left main and 3 vessel coronary artery disease. Assessment for potential risk factor modification, dietary therapy or pharmacologic therapy may be warranted, if clinically indicated. 4. New compression fractures of T7 and T9, most severe at T9 where there is 40% loss of anterior vertebral body height. Aortic Atherosclerosis (ICD10-I70.0). Electronically Signed   By: Vinnie Langton M.D.   On: 06/14/2020 16:17   DG Chest Portable 1 View  Result Date: 06/14/2020 CLINICAL DATA:  Choking episode today. EXAM: PORTABLE CHEST 1 VIEW COMPARISON:  Single-view  of the chest 12/06/2019. FINDINGS: Calcified pleural plaques are again seen. Lungs are clear. Heart size is enlarged. Aortic atherosclerosis. No pneumothorax or pleural fluid. IMPRESSION: No acute disease. Calcified pleural plaques consistent with prior asbestos exposure. Cardiomegaly. Aortic Atherosclerosis (ICD10-I70.0). Electronically Signed   By: Inge Rise M.D.   On: 06/14/2020 14:37   Review of Systems - History obtained from the patient General ROS: negative Psychological ROS: negative Ophthalmic ROS: negative ENT ROS: As in HPI Allergy and Immunology ROS: negative Hematological and Lymphatic ROS: negative Endocrine ROS: negative Respiratory ROS: As  in HPI Cardiovascular ROS: no chest pain or dyspnea on exertion Gastrointestinal ROS: no abdominal pain, change in bowel habits, or black or bloody stools Genito-Urinary ROS: no dysuria, trouble voiding, or hematuria Musculoskeletal ROS: negative Neurological ROS: no TIA or stroke symptoms Dermatological ROS: negative  Blood pressure 122/68, pulse 71, temperature 97.9 F (36.6 C), temperature source Oral, resp. rate 15, height 5\' 3"  (1.6 m), weight 66.2 kg, SpO2 94 %. Physical Exam General appearance: alert, cooperative, appears stated age and no distress Head: Normocephalic, without obvious abnormality, atraumatic. Eyes: Pupils are equal, round, reactive to light. Extraocular motion is intact.  Ears: Examination of the ears shows normal auricles and external auditory canals bilaterally.  Nose: Nasal examination shows normal mucosa, septum, turbinates.  Face: Facial examination shows no asymmetry. Palpation of the face elicit no significant tenderness.  Mouth: Oral cavity examination shows no mucosal lacerations. No significant trismus is noted.  Neck: Palpation of the neck reveals no lymphadenopathy or mass. The trachea is midline. The thyroid is not significantly enlarged. Occasional noisy breathing. Neuro: Cranial nerves 2-12 are all grossly in tact. Skin: Skin color, texture, turgor normal. No rashes or lesions Neurologic: Awake alert.  No obvious focal neurological deficits.   Procedure:  Flexible Fiberoptic Laryngoscopy Anesthesia: None Indication: Evaluation for possible upper airway obstruction Description: Risks, benefits, and alternatives of flexible endoscopy were explained to the patient. Specific mention was made of the risk of throat numbness with difficulty swallowing, possible bleeding from the nose and mouth, and pain from the procedure.  The patient gave oral consent to proceed. The flexible scope was inserted into the right nasal cavity and advanced towards the  nasopharynx.  Visualized mucosa over the turbinates and septum were normal.  The nasopharynx was clear.  Oropharyngeal walls were symmetric and mobile without lesion, mass, or edema.  Hypopharynx was also without  lesion or edema.  Larynx was mobile without lesions.  No lesions or asymmetry in the supraglottic larynx.  Arytenoid mucosa was normal.  Posterior commissure with normal mucosa.  True vocal folds were pale yellow and without mass or lesion. Glottic opening is widely patent.  No foreign body noted.   Assessment/Plan: Possible food aspiration, now with shortness of breath - The patient has a normal upper airway evaluation. No upper airway obstruction is noted on today's laryngoscopy exam. - Pulmonary consult and possible bronchoscopy. - No other ENT intervention is recommended at this time.  Kendrick Haapala W Kalee Mcclenathan 06/14/2020, 5:36 PM

## 2020-06-14 NOTE — ED Provider Notes (Signed)
[facility] DEPT Provider Note   CSN: [id number] Arrival date & time: 06/14/20  1345     History Chief Complaint  Patient presents with  . Foreign Body  . Airway Obstruction    Linda [name] is a 74 y.o. female brought in by EMS for evaluation of SOB, airway obstruction. Patient was at independent living where she was eating boneless chicken when she choked. The staff performed Heimlich maneuver on her and was able to expel some of the chicken but patient was still having difficulty breathing, prompting EMS call. On EMS arrival patient was stridorous but oxygen sats were good. They put her on 3 [place] for comfort. On ED arrival, patient still feels a sensation that something is stuck in her throat.  She feels like she is having trouble breathing.  EM LEVEL 5 CAVEAT DUE TO ACUITY OF CONDITION   The history is provided by the EMS personnel and the patient.       Past Medical History:  Diagnosis Date  . Acute upper GI bleed 06/2014   while pt taking coumadin, plavix, and meloxicam---despite being told not to take coumadin.  . Anginal pain ([place])    Nonobstructive CAD 2014; however, her cardiologist put her on a statin for this and NOT for hyperlipidemia per pt report.  Atyp CP 08/2017 at card f/u, plan for myoc perf imaging.  . Anxiety    panic attacks  . Asthma    w/ asbestososis   . BPPV (benign paroxysmal positional vertigo) 12/16/2012  . Chronic diastolic CHF (congestive heart failure) (HCC)    dry wt as of 11/06/16 is 168 lbs.  . Chronic lower back pain   . COPD (chronic obstructive pulmonary disease) ([place])   . DDD (degenerative disc disease)    lumbar and cervical.   . Diverticular disease   . Fibromyalgia    Patient states dx was around her late 43s but she had sx's for years prior to this.  . H/O hiatal hernia   . History of pneumonia    hospitalized 12/2011, 02/2013, and 07/2013 [facility]) for this  . HTN (hypertension)    Renal artery  dopplers 04/2013 neg for stenosis.  . Hypervitaminosis D 09/27/2019   over-supplemented.  Stopped vit D and plan recheck 2 mo.  . Idiopathic angio-edema-urticaria [id number]; 2021   Angioedema component was very minimal.  2021->Dr. Bobbitt (allergist) eval.  . Insomnia   . Iron deficiency anemia    Hematologist in [place], [place] did extensive w/u; no cause found; failed oral supplement;; gets fairly regular (q41m or so) IV iron infusions (Venofer -iron sucrose- 200mg  with procrit.  "for 14 yr I've been getting blood work q month & getting infusions prn" (07/12/2013).  Dr. Marin Olp locally, iron infusions done, EPO deficiency dx'd  . Migraine syndrome    "not as often anymore; used to be ~ q wk" (07/12/2013)  . Mixed incontinence urge and stress   . Nephrolithiasis    "passed all on my own or they are still in there" (07/12/2013)  . Neuroleptic induced Parkinsonism ([place]) 2018   Dr. Carles Collet, neuro, saw her 11/24/17 and recommended d/c of abilify as first step.  D/c'd abilify and pt got complete recovery.  . OSA on CPAP    prior to move to [place]--had another sleep study 10/2015 w/pulm Dr. Camillo Flaming.  . Osteoarthritis    "severe; progressing fast" (07/12/2013); multiple joints-not surgical candidate for TKR (03/2015).  Triamcinolon knee injections by Dr. Tessa Lerner 12/2017.  [name] Kitchen  Pernicious anemia 08/24/2014  . Pleural plaque with presence of asbestos 07/22/2013  . Pulmonary embolism ([place]) 07/2013   Dx at [facility] with very small peripheral upper lobe pe 07/2013: pt took coumadin for about 8-9 mo  . Pyelonephritis    "several times over the last 30 yr" (07/12/2013)  . RBBB (right bundle branch block)   . Recurrent major depression ([place])   . Recurrent UTI    hx of hospitalization for pyelonephritis; started abx prophylaxis 06/2015  . Syncope    Hypotensive; ED visit--Dr. Terrence Dupont did Cath--nonobstructive CAD, EF 55-60%.  In retrospect, suspect pt rx med misuse/polypharmacy    Patient Active Problem List   Diagnosis Date Noted  .  Choking due to food (regurgitated) 06/14/2020  . Allergic reaction 02/01/2020  . Slurred speech 01/21/2020  . Idiopathic angioedema 01/20/2020  . Dysarthria 01/20/2020  . Hypotension 10/04/2019  . COPD (chronic obstructive pulmonary disease) ([place]) 01/21/2018  . Pulmonary asbestosis ([place]) 01/21/2018  . Influenza A 01/02/2018  . Hypomagnesemia 01/02/2018  . Failure to thrive in adult 01/01/2018  . Altered mental status 10/21/2017  . Lumbar radiculitis 02/10/2017  . Dehydration 12/04/2016  . Low serum erythropoietin level 10/17/2016  . RBBB 09/23/2016  . Primary osteoarthritis of left knee 06/19/2016  . Debilitated patient 06/06/2016  . CHF (congestive heart failure) ([place]) 05/30/2016  . SOB (shortness of breath)   . Chronic respiratory failure with hypoxia ([place]) 02/07/2016  . Restrictive lung disease 02/07/2016  . Rotator cuff syndrome of right shoulder 01/10/2016  . UTI (urinary tract infection) 01/01/2016  . Encephalopathy, metabolic 22/29/7989  . CAP (community acquired pneumonia) 01/01/2016  . Fall at home 01/01/2016  . Rhabdomyolysis 01/01/2016  . Diastolic heart failure ([place]) 10/16/2015  . CAD-minor 2014 08/16/2015  . Chest pain with moderate risk for cardiac etiology 08/16/2015  . Narrowing of intervertebral disc space 07/17/2015  . Bilateral lower leg pain 01/24/2015  . 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 Chronic pain syndrome 11/21/2014  . Anxiety and depression 11/21/2014  . Abnormal grief reaction 11/21/2014  . Hypokalemia 11/21/2014  . Hyperlipidemia 11/21/2014  . Chronic pain disorder 11/21/2014  . Primary osteoarthritis of right knee 10/18/2014  . Pernicious anemia 08/24/2014  . Generalized anxiety disorder--with occasional panic attacks.  08/05/2014  . Recurrent major depression-severe ([place]) 08/05/2014  . Multifactorial gait disorder 07/26/2014  . Epistaxis 07/18/2014  . Acute GI bleeding 07/17/2014  . Anemia associated with acute blood loss 07/17/2014   . Syncope 07/17/2014  . History of pulmonary embolism 07/17/2014  . GI bleed 07/17/2014  . Arthritis 05/11/2014  . DDD (degenerative disc disease) 05/11/2014  . Fibrositis 05/11/2014  . Amianthosis ([place]) 05/11/2014  . [name] ([place]) 05/11/2014  . Grief 04/28/2014  . OSA (obstructive sleep apnea) 04/24/2014  . Chronic diastolic CHF, NYHA class 1 04/24/2014  . Acute respiratory failure ([place]) 08/20/2013  . Atelectasis 08/06/2013  . Acute pulmonary embolism ([place]) 08/04/2013  . Pulmonary embolism ([place]) 08/04/2013  . Pleural plaque with presence of asbestos 07/22/2013  . Polypharmacy 04/26/2013  . Fibromyalgia syndrome 03/01/2013  . Insomnia 11/12/2012  . HTN (hypertension), benign 10/25/2012    Past Surgical History:  Procedure Laterality Date  . APPENDECTOMY  1960  . AXILLARY SURGERY Left 1978   Multiple "lump" in armpit per pt  . CARDIAC CATHETERIZATION  01/2013   nonobstructive CAD, EF 55-60%  . CARDIOVASCULAR STRESS TEST  02/22/15   Low risk myocard perf imaging; wall motion normal, normal EF  . carotid duplex  doppler  10/21/2017   R vertebral flow suggestive of possible distal obstruction.  Pt declined further w/u as of 10/29/17 but need to revisit this problem periodically.  . COCCYX REMOVAL  1972  . DEXA  06/05/2017   T-score -3.1  . DILATION AND CURETTAGE OF UTERUS  ? 1970's  . ESOPHAGOGASTRODUODENOSCOPY N/A 07/19/2014   Gastritis found + in the setting of supratherapeutic INR, +plavix, + meloxicam.  . EYE SURGERY Left 2012-2013   "injections for ~ 1 yr; don't really know what for" (07/12/2013)  . HEEL SPUR SURGERY Left 2008  . KNEE SURGERY  2005  . LEFT HEART CATHETERIZATION WITH CORONARY ANGIOGRAM N/A 01/30/2013   Procedure: LEFT HEART CATHETERIZATION WITH CORONARY ANGIOGRAM;  Surgeon: Clent Demark, MD;  Location: [facility] C[facility] LAB;  Service: Cardiovascular;  Laterality: N/A;  . PLANTAR FASCIA RELEASE Left 2008  . SPIROMETRY  04/25/14   In hosp for acute asthma/COPD flare:  mixed obstructive and restrictive lung disease. The FEV1 is severely reduced at 45% predicted.  FEV1 signif decreased compared to prior spirometry 07/23/13.  . TENDON RELEASE  1996   Right forearm and hand  . TOTAL ABDOMINAL HYSTERECTOMY  1974  . TRANSTHORACIC ECHOCARDIOGRAM  01/2013; 04/2014;08/2015; 09/2017   2014--NORMAL.  2015--focal basal septal hypertrophy, EF 55-60%, grade I diast dysfxn, mild LAE.  08/2015 EF 55-60%, nl LV syst fxn, grade I DD, valves wnl. 10/21/17: EF 65-70%, grd I DD, o/w normal.     OB History   No obstetric history on file.     Family History  Problem Relation Age of Onset  . Arthritis Mother   . Kidney disease Mother   . Heart disease Father   . Stroke Father   . Hypertension Father   . Diabetes Father   . Heart attack Father   . Heart attack Paternal Grandmother   . Diabetes Sister        one sister  . Hypertension Sister   . Asthma Sister   . Hypertension Brother   . Asthma Brother   . Asthma Daughter   . Multiple sclerosis Son     Social History   Tobacco Use  . Smoking status: Never Smoker  . Smokeless tobacco: Never Used  . Tobacco comment: never used tobacco  Vaping Use  . Vaping Use: Never used  Substance Use Topics  . Alcohol use: No    Alcohol/week: 0.0 standard drinks  . Drug use: No    Home Medications Prior to Admission medications   Medication Sig Start Date End Date Taking? Authorizing Provider  albuterol (VENTOLIN HFA) 108 (90 Base) MCG/ACT inhaler Inhale 1-2 puffs into the lungs every 6 (six) hours as needed for wheezing or shortness of breath. 12/06/19   McGowen, Adrian Blackwater, MD  alendronate (FOSAMAX) 70 MG tablet Take 1 tablet (70 mg total) by mouth every 7 (seven) days. Take with a full glass of water on an empty stomach, first thing in the morning and remain up right for 30 minutes after taking. 07/26/19   McGowen, Adrian Blackwater, MD  ALPRAZolam Duanne Moron) 1 MG tablet TAKE 1 TABLET THREE TIMES DAILY AS NEEDED FOR ANXIETY. 05/29/20    McGowen, Adrian Blackwater, MD  aspirin 81 MG tablet Take 81 mg by mouth at bedtime.    [provider]  cyclobenzaprine (FLEXERIL) 5 MG tablet Take 1 tablet (5 mg total) by mouth 3 (three) times daily as needed for muscle spasms. 05/24/20   Meredith Staggers, MD  diclofenac sodium (VOLTAREN)  1 % GEL Apply 2 g topically 4 (four) times daily. Patient taking differently: Apply 2 g topically 4 (four) times daily as needed.  10/20/19   Meredith Staggers, MD  donepezil (ARICEPT) 10 MG tablet Take 1 tablet (10 mg total) by mouth at bedtime. 01/31/20   McGowen, Adrian Blackwater, MD  DULoxetine (CYMBALTA) 30 MG capsule TAKE 1 CAPSULE ONCE DAILY ALONG WITH 60MG  CAPSULE FOR TOTAL 90MG  DAILY. 04/17/20   McGowen, Adrian Blackwater, MD  DULoxetine (CYMBALTA) 60 MG capsule TAKE 1 CAPSULE A DAY TO BE COMBINED WITH 30MG  CAPSULE Patient taking differently: Take 60 mg by mouth daily. Take with 30mg  to total 90mg  daily. 07/27/19   McGowen, Adrian Blackwater, MD  EPINEPHrine 0.3 mg/0.3 mL IJ SOAJ injection Inject 0.3 mLs (0.3 mg total) into the muscle as needed for anaphylaxis. 01/22/20   Geradine Girt, DO  fluconazole (DIFLUCAN) 100 MG tablet TAKE (1) TABLET DAILY AS NEEDED FOR THRUSH 05/03/20   McGowen, Adrian Blackwater, MD  fluticasone (FLONASE) 50 MCG/ACT nasal spray Place 2 sprays into both nostrils daily. 09/27/19   McGowen, Adrian Blackwater, MD  furosemide (LASIX) 40 MG tablet Take 1 tablet (40 mg total) by mouth daily as needed for fluid. 10/07/19   Cherylann Ratel A, DO  ipratropium (ATROVENT) 0.03 % nasal spray Place 2 sprays into both nostrils every 12 (twelve) hours. 09/27/19   McGowen, Adrian Blackwater, MD  isosorbide mononitrate (IMDUR) 30 MG 24 hr tablet TAKE 2 TABLETS IN THE MORNING AND 1 TABLET IN THE EVENING. 05/24/20   Lelon Perla, MD  lamoTRIgine (LAMICTAL) 150 MG tablet TAKE 1 TABLET EACH DAY. 11/22/19   McGowen, Adrian Blackwater, MD  metoprolol succinate (TOPROL-XL) 50 MG 24 hr tablet TAKE (1) TABLET DAILY IN THE MORNING. Patient taking differently: Take 50 mg  by mouth daily.  07/27/19   McGowen, Adrian Blackwater, MD  minocycline (MINOCIN) 50 MG capsule Take 50 mg by mouth 2 (two) times daily. 10/26/19   [provider]  Multiple Vitamin (MULTIVITAMIN WITH MINERALS) TABS tablet Take 1 tablet by mouth daily.    [provider]  nitroGLYCERIN (NITROSTAT) 0.4 MG SL tablet Place 1 tablet (0.4 mg total) under the tongue every 5 (five) minutes as needed for chest pain (x 3 doses). 09/23/19   Lelon Perla, MD  ondansetron (ZOFRAN) 8 MG tablet Take 1 tablet (8 mg total) by mouth every 8 (eight) hours as needed for nausea or vomiting. 10/22/19   Tammi Sou, MD  oxyCODONE-acetaminophen (PERCOCET) 10-325 MG tablet Every 6 hours as needed 05/24/20   Meredith Staggers, MD  pantoprazole (PROTONIX) 40 MG tablet TAKE 1 TABLET BY MOUTH DAILY. 12/10/19   McGowen, Adrian Blackwater, MD  rosuvastatin (CRESTOR) 20 MG tablet Take 1 tablet (20 mg total) by mouth daily. 09/23/19 02/28/28  Lelon Perla, MD  solifenacin (VESICARE) 10 MG tablet Take 1 tablet (10 mg total) by mouth daily. 04/28/20   McGowen, Adrian Blackwater, MD  traZODone (DESYREL) 50 MG tablet TAKE 2-4 TABLETS AT BEDTIME 12/10/19   McGowen, Adrian Blackwater, MD  Vitamin D, Ergocalciferol, (DRISDOL) 1.25 MG (50000 UT) CAPS capsule TAKE (1) CAPSULE TWICE A WEEK. 07/27/19   McGowen, Adrian Blackwater, MD    Allergies    Abilify [aripiprazole] and Penicillins  Review of Systems   Review of Systems  Unable to perform ROS: Acuity of condition    Physical Exam Updated Vital Signs BP 122/68 (BP Location: Left Arm)   Pulse 71   Temp  97.9 F (36.6 C) (Oral)   Resp 15   Ht 5\' 3"  (1.6 m)   Wt 66.2 kg   SpO2 94%   BMI 25.86 kg/m   Physical Exam Vitals and nursing note reviewed.  Constitutional:      Appearance: She is ill-appearing.  HENT:     Head: Normocephalic and atraumatic.     Mouth/Throat:     Comments: Posterior oropharynx is clear without any erythema. No identifiable foreign body.  Eyes:     General: Lids are  normal.     Conjunctiva/sclera: Conjunctivae normal.     Pupils: Pupils are equal, round, and reactive to light.  Neck:     Comments: No edema. No crepitus.  Cardiovascular:     Rate and Rhythm: Normal rate and regular rhythm.     Pulses: Normal pulses.     Heart sounds: Normal heart sounds. No murmur heard.  No friction rub. No gallop.   Pulmonary:     Effort: Respiratory distress present.     Breath sounds: Stridor present. No wheezing.     Comments: Patient with stridor and appears in distress. She is speaking in 2-3 word sentences. No wheezing noted to lower lung fields.  Abdominal:     Palpations: Abdomen is soft. Abdomen is not rigid.     Tenderness: There is no abdominal tenderness. There is no guarding.     Comments: Abdomen is soft, non-distended, non-tender. No rigidity, No guarding. No peritoneal signs.  Musculoskeletal:        General: Normal range of motion.     Cervical back: Full passive range of motion without pain.  Skin:    General: Skin is warm and dry.     Capillary Refill: Capillary refill takes less than 2 seconds.  Neurological:     Mental Status: She is alert and oriented to person, place, and time.  Psychiatric:        Speech: Speech normal.     ED Results / Procedures / Treatments   Labs (all labs ordered are listed, but only abnormal results are displayed) Labs Reviewed  BASIC METABOLIC PANEL - Abnormal; Notable for the following components:      Result Value   Glucose, Bld 106 (*)    Calcium 8.6 (*)    All other components within normal limits  SARS CORONAVIRUS 2 BY RT PCR (HOSPITAL ORDER, [facility])  CBC WITH DIFFERENTIAL/PLATELET    EKG None  Radiology DG Neck Soft Tissue  Result Date: 06/14/2020 CLINICAL DATA:  Choking episode. EXAM: NECK SOFT TISSUES - 1+ VIEW COMPARISON:  Chest x-ray dated December 06, 2019. CT cervical spine dated October 21, 2017. FINDINGS: There is no evidence of retropharyngeal soft  tissue swelling or epiglottic enlargement. The cervical airway is unremarkable and no radio-opaque foreign body identified. Bilateral carotid artery calcific atherosclerosis. Bilateral calcified pleural plaques in the lungs again noted. IMPRESSION: 1. Negative.  No radiopaque foreign body identified. Electronically Signed   By: Titus Dubin M.D.   On: 06/14/2020 14:39   CT CHEST WO CONTRAST  Result Date: 06/14/2020 CLINICAL DATA:  74 year old female with history of aspiration. EXAM: CT CHEST WITHOUT CONTRAST TECHNIQUE: Multidetector CT imaging of the chest was performed following the standard protocol without IV contrast. COMPARISON:  Chest CT 01/01/2016. FINDINGS: Cardiovascular: Heart size is normal. There is no significant pericardial fluid, thickening or pericardial calcification. There is aortic atherosclerosis, as well as atherosclerosis of the great vessels of the mediastinum and  the coronary arteries, including calcified atherosclerotic plaque in the left main, left anterior descending, left circumflex and right coronary arteries. Mediastinum/Nodes: No pathologically enlarged mediastinal or hilar lymph nodes. Please note that accurate exclusion of hilar adenopathy is limited on noncontrast CT scans. Esophagus is unremarkable in appearance. No axillary lymphadenopathy. Lungs/Pleura: Numerous calcified pleural plaques bilaterally, indicative of asbestos related pleural disease. No pleural effusions. No suspicious appearing pulmonary nodules or masses are noted. No acute consolidative airspace disease. Some peripheral predominant areas of ground-glass attenuation and parenchymal banding throughout the mid to lower lungs bilaterally. No honeycombing. Upper Abdomen: Aortic atherosclerosis. Musculoskeletal: New compression fractures of T7 and T9, most severe at T9 with 40% loss of anterior vertebral body height. There are no aggressive appearing lytic or blastic lesions noted in the visualized portions of  the skeleton. IMPRESSION: 1. No imaging findings to suggest significant aspiration. 2. Asbestos related pleural disease with imaging findings in the lung bases bilaterally concerning for developing interstitial lung disease such as asbestosis. Outpatient referral to Pulmonology for appropriate follow-up is recommended. 3. Aortic atherosclerosis, in addition to left main and 3 vessel coronary artery disease. Assessment for potential risk factor modification, dietary therapy or pharmacologic therapy may be warranted, if clinically indicated. 4. New compression fractures of T7 and T9, most severe at T9 where there is 40% loss of anterior vertebral body height. Aortic Atherosclerosis (ICD10-I70.0). Electronically Signed   By: Vinnie Langton M.D.   On: 06/14/2020 16:17   DG Chest Portable 1 View  Result Date: 06/14/2020 CLINICAL DATA:  Choking episode today. EXAM: PORTABLE CHEST 1 VIEW COMPARISON:  Single-view of the chest 12/06/2019. FINDINGS: Calcified pleural plaques are again seen. Lungs are clear. Heart size is enlarged. Aortic atherosclerosis. No pneumothorax or pleural fluid. IMPRESSION: No acute disease. Calcified pleural plaques consistent with prior asbestos exposure. Cardiomegaly. Aortic Atherosclerosis (ICD10-I70.0). Electronically Signed   By: Inge Rise M.D.   On: 06/14/2020 14:37    Procedures .Critical Care Performed by: Volanda Napoleon, PA-C Authorized by: Volanda Napoleon, PA-C   Critical care provider statement:    Critical care time (minutes):  45   Critical care was necessary to treat or prevent imminent or life-threatening deterioration of the following conditions:  Respiratory failure   Critical care was time spent personally by me on the following activities:  Discussions with consultants, evaluation of patient's response to treatment, examination of patient, ordering and performing treatments and interventions, ordering and review of laboratory studies, ordering and  review of radiographic studies, pulse oximetry, re-evaluation of patient's condition, obtaining history from patient or surrogate and review of old charts   (including critical care time)  Medications Ordered in ED Medications  LORazepam (ATIVAN) injection 1 mg (1 mg Intravenous Given 06/14/20 1524)  dexamethasone (DECADRON) injection 10 mg (10 mg Intravenous Given 06/14/20 1729)    ED Course  I have reviewed the triage vital signs and the nursing notes.  Pertinent labs & imaging results that were available during my care of the patient were reviewed by me and considered in my medical decision making (see chart for details).    MDM Rules/Calculators/A&P                          74 year old female brought in by EMS for evaluation of airway obstruction.  She was eating some boneless chicken and became choked.  Staff at nursing home gave Heimlich with partial resolution, but patient was still having difficulty breathing.  She was maintaining good oxygen was put on 3 L of nasal cannula for comfort.  On initially arrival, she is afebrile, vitals are stable.  She does appear stridorous and in distress.  She has some degree of tripoding.  She is only able to speak a few words at a time.  She is maintaining O2 sats of about 97-98% on 3 L nasal cannula.  There may be some component of anxiety to this.  I do not visualize any foreign body in the posterior oropharynx.  Concern about upper airway obstruction.  Chest and soft tissue neck x-ray ordered.  Dr. Maryan Rued as discussed with Dr. Benjamine Mola (ENT).   Patient switched to nonrebreather.  She had some episodes where she was dropped into the low 90s.  She would bump up to about 93% on 3 L.  She feels more comfortable with the nonrebreather.  Dr. Benjamine Mola (ENT) is on his way to scope the patient.  Patient scoped by Dr. Benjamine Mola (ENT) at bedside.  There is no evidence of foreign body noted in the upper airway.  He is requesting critical care medicine to evaluate to see  if she needs bronchoscope.  Critical care is at bedside to evaluate patient.  They are requesting CT chest for evaluation of obstruction.  CT chest shows no evidence of significant aspiration.  She has new compression fractures of T7 and T9.  Critical care has evaluated patient.  They recommend hospitalist admission for observation overnight.  If patient continues to have issues, will need bronchoscope tomorrow.  Reevaluation.  Patient does appear much more comfortable.  She is maintaining O2 sats of 94% on room air.  She still has some occasional stridorous type sound.  Given critical care's recommendation, I feel that observation is best.  Patient is agreeable to plan.  Discussed with Dr. Maryland Pink (hospitalist) who accepts patient for admission.   Portions of this note were generated with [employer]. Dictation errors may occur despite best attempts at proofreading.  Final Clinical Impression(s) / ED Diagnoses Final diagnoses:  Foreign body sensation in throat    Rx / DC Orders ED Discharge Orders    None       Desma Mcgregor 06/14/20 1739    Blanchie Dessert, MD 06/16/20 [phone]

## 2020-06-14 NOTE — Consult Note (Addendum)
NAME:  Linda [name], MRN:  [id number], DOB:  04/23/1946, LOS: 0 ADMISSION DATE:  06/14/2020, CONSULTATION DATE: 6/23 REFERRING MD:  Dr. Maryan Rued, CHIEF COMPLAINT: Choking episode   Brief History   74 y/o F who presented to [facility] ER on 6/23 after a choking episode requiring abdominal thrusts.  Concern for retained chicken nugget.    History of present illness   74 y/o F, assisted living resident, who was eating lunch when she choked on a chicken nugget.  She required abdominal thrusts by staff but continued to have shortness of breath. On arrival to the ER, the patient reportedly had stridor with "gasping respirations". She was placed on a NRB for comfort (pt maintaining saturations).  Initial CXR showed known pleural plaques, no active disease.  ENT saw the patient in evaluation and assessed the upper airway due to upper airway noise which was negative.    PCCM called for evaluation of possible need for bronchoscopy.   Past Medical History  OSA on CPAP  COPD  Asbestos Exposure with Pleural Plaques Pernicious Anemia  PE - dx 2014 RBBB HTN Chronic Diastolic CHF Panic Attacks Neuroleptic induced Parkinsonism Idiopathic Angio-Edema Urticaria  Fibromyalgia  Anxiety UGIB - 2015  Significant Hospital Events     Consults:    Procedures:    Significant Diagnostic Tests:  CT Chest w/o Contrast 6/23 >>   Micro Data:  COVID 6/23 >> negative  Antimicrobials:     Interim history/subjective:  Pt reports feeling anxious  EDP notes upper airway noise improved with ativan   Objective   Blood pressure 108/65, pulse 68, temperature 97.9 F (36.6 C), temperature source Oral, resp. rate 17, height 5\' 3"  (1.6 m), weight 66.2 kg, SpO2 98 %.       No intake or output data in the 24 hours ending 06/14/20 1500 Filed Weights   06/14/20 1405  Weight: 66.2 kg    Examination: General: elderly female lying in bed in NAD   HEENT: MM pink/moist, no jvd, wearing glasses Neuro:  AAOx4, speech clear, MAE CV: s1s2 rrr, no m/r/g PULM: non-labored, normal effort, intermittently will take large deep breath with upper airway noise (no regular pattern or work of breathing), soft raspy voice, on RA with saturations ~ 97%, referred upper airway wheeze bilaterally  GI: soft, bsx4 active  Extremities: warm/dry, no edema  Skin: no rashes or lesions  Resolved Hospital Problem list     Assessment & Plan:   Choking Episode s/p Abdominal Thrusts Dyspnea  CXR negative for acute process. Upper airway laryngoscopy evaluation per ENT without retained foreign body.   -CT chest negative based on personal read, will wait for final radiology review -O2 as needed for comfort  -bronchoscopy tentatively arranged for 0900 6/24, will leave scheduled for now and cancel in am  -pulmonary hygiene -if breathing pattern returns to her baseline, she likely could return to the assisted living 6/23 or 6/24   Best practice:  Diet: NPO after MN DVT prophylaxis: per primary  GI prophylaxis: per primary  Glucose control: per primary  Mobility: as tolerated  Family Communication: Son updated at bedside 6/23 Disposition: observation, per [facility]  Labs   CBC: No results for input(s): WBC, NEUTROABS, HGB, HCT, MCV, PLT in the last 168 hours.  Basic Metabolic Panel: No results for input(s): NA, K, CL, CO2, GLUCOSE, BUN, CREATININE, CALCIUM, MG, PHOS in the last 168 hours. GFR: CrCl cannot be calculated (Patient's most recent lab result is older than the maximum 21  days allowed.). No results for input(s): PROCALCITON, WBC, LATICACIDVEN in the last 168 hours.  Liver Function Tests: No results for input(s): AST, ALT, ALKPHOS, BILITOT, PROT, ALBUMIN in the last 168 hours. No results for input(s): LIPASE, AMYLASE in the last 168 hours. No results for input(s): AMMONIA in the last 168 hours.  ABG    Component Value Date/Time   PHART 7.430 07/20/2013 2000   PCO2ART 41.0 07/20/2013 2000   PO2ART  57.4 (L) 07/20/2013 2000   HCO3 26.7 (H) 07/20/2013 2000   TCO2 23 01/20/2020 1720   ACIDBASEDEF 1.1 07/13/2013 1435   O2SAT 91.1 07/20/2013 2000     Coagulation Profile: No results for input(s): INR, PROTIME in the last 168 hours.  Cardiac Enzymes: No results for input(s): CKTOTAL, CKMB, CKMBINDEX, TROPONINI in the last 168 hours.  HbA1C: Hgb A1c MFr Bld  Date/Time Value Ref Range Status  01/02/2016 06:10 AM 5.6 4.8 - 5.6 % Final    Comment:    (NOTE)         Pre-diabetes: 5.7 - 6.4         Diabetes: >6.4         Glycemic control for adults with diabetes: <7.0   01/31/2013 05:00 AM 5.7 (H) <5.7 % Final    Comment:    (NOTE)                                                                       According to the ADA Clinical Practice Recommendations for 2011, when HbA1c is used as a screening test:  >=6.5%   Diagnostic of Diabetes Mellitus           (if abnormal result is confirmed) 5.7-6.4%   Increased risk of developing Diabetes Mellitus References:Diagnosis and Classification of Diabetes Mellitus,Diabetes [id number],66(AYTKZ 1):S62-S69 and Standards of Medical Care in         Diabetes - 2011,Diabetes SWFU,9323,55 (Suppl 1):S11-S61.    CBG: No results for input(s): GLUCAP in the last 168 hours.  Review of Systems:   Gen: Denies fever, chills, weight change, fatigue, night sweats HEENT: Denies blurred vision, double vision, hearing loss, tinnitus, sinus congestion, rhinorrhea, sore throat, neck stiffness, dysphagia PULM: Denies shortness of breath, cough, sputum production, hemoptysis, wheezing CV: Denies chest pain, edema, orthopnea, paroxysmal nocturnal dyspnea, palpitations GI: Denies abdominal pain, nausea, vomiting, diarrhea, hematochezia, melena, constipation, change in bowel habits GU: Denies dysuria, hematuria, polyuria, oliguria, urethral discharge Endocrine: Denies hot or cold intolerance, polyuria, polyphagia or appetite change Derm: Denies rash, dry skin,  scaling or peeling skin change Heme: Denies easy bruising, bleeding, bleeding gums Neuro: Denies headache, numbness, weakness, slurred speech, loss of memory or consciousness  Past Medical History  She,  has a past medical history of Acute upper GI bleed (06/2014), Anginal pain ([place]), Anxiety, Asthma, BPPV (benign paroxysmal positional vertigo) (12/16/2012), Chronic diastolic CHF (congestive heart failure) (HCC), Chronic lower back pain, COPD (chronic obstructive pulmonary disease) ([place]), DDD (degenerative disc disease), Diverticular disease, Fibromyalgia, H/O hiatal hernia, Hay fever, History of pneumonia, HTN (hypertension), Hypervitaminosis D (09/27/2019), Idiopathic angio-edema-urticaria (73220; 2021), Insomnia, Iron deficiency anemia, Migraine syndrome, Mixed incontinence urge and stress, Nephrolithiasis, Neuroleptic induced Parkinsonism ([place]) (2018), OSA on CPAP, Osteoarthritis, Pernicious anemia (08/24/2014), Pleural plaque with presence of  asbestos (07/22/2013), Pulmonary embolism ([place]) (07/2013), Pyelonephritis, RBBB (right bundle branch block), Recurrent major depression ([place]), Recurrent UTI, Syncope, and Tension headache, chronic.   Surgical History    Past Surgical History:  Procedure Laterality Date  . APPENDECTOMY  1960  . AXILLARY SURGERY Left 1978   Multiple "lump" in armpit per pt  . CARDIAC CATHETERIZATION  01/2013   nonobstructive CAD, EF 55-60%  . CARDIOVASCULAR STRESS TEST  02/22/15   Low risk myocard perf imaging; wall motion normal, normal EF  . carotid duplex doppler  10/21/2017   R vertebral flow suggestive of possible distal obstruction.  Pt declined further w/u as of 10/29/17 but need to revisit this problem periodically.  . COCCYX REMOVAL  1972  . DEXA  06/05/2017   T-score -3.1  . DILATION AND CURETTAGE OF UTERUS  ? 1970's  . ESOPHAGOGASTRODUODENOSCOPY N/A 07/19/2014   Gastritis found + in the setting of supratherapeutic INR, +plavix, + meloxicam.  . EYE SURGERY Left  2012-2013   "injections for ~ 1 yr; don't really know what for" (07/12/2013)  . HEEL SPUR SURGERY Left 2008  . KNEE SURGERY  2005  . LEFT HEART CATHETERIZATION WITH CORONARY ANGIOGRAM N/A 01/30/2013   Procedure: LEFT HEART CATHETERIZATION WITH CORONARY ANGIOGRAM;  Surgeon: Clent Demark, MD;  Location: [facility] CATH LAB;  Service: Cardiovascular;  Laterality: N/A;  . PLANTAR FASCIA RELEASE Left 2008  . SPIROMETRY  04/25/14   In hosp for acute asthma/COPD flare: mixed obstructive and restrictive lung disease. The FEV1 is severely reduced at 45% predicted.  FEV1 signif decreased compared to prior spirometry 07/23/13.  . TENDON RELEASE  1996   Right forearm and hand  . TOTAL ABDOMINAL HYSTERECTOMY  1974  . TRANSTHORACIC ECHOCARDIOGRAM  01/2013; 04/2014;08/2015; 09/2017   2014--NORMAL.  2015--focal basal septal hypertrophy, EF 55-60%, grade I diast dysfxn, mild LAE.  08/2015 EF 55-60%, nl LV syst fxn, grade I DD, valves wnl. 10/21/17: EF 65-70%, grd I DD, o/w normal.     Social History   reports that she has never smoked. She has never used smokeless tobacco. She reports that she does not drink alcohol and does not use drugs.   Family History   Her family history includes Arthritis in her mother; Asthma in her brother, daughter, and sister; Diabetes in her father and sister; Heart attack in her father and paternal grandmother; Heart disease in her father; Hypertension in her brother, father, and sister; Kidney disease in her mother; Multiple sclerosis in her son; Stroke in her father.   Allergies Allergies  Allergen Reactions  . Abilify [Aripiprazole] Other (See Comments)    parkinsonism  . Penicillins Itching, Swelling and Rash    Tolerated Cefepime in ED. Has patient had a PCN reaction causing immediate rash, facial/tongue/throat swelling, SOB or lightheadedness with hypotension: Yes Has patient had a PCN reaction causing severe rash involving mucus membranes or skin necrosis: No Has patient had a PCN  reaction that required hospitalization: No  Has patient had a PCN reaction occurring within the last 10 years: No      Home Medications  Prior to Admission medications   Medication Sig Start Date End Date Taking? Authorizing Provider  albuterol (VENTOLIN HFA) 108 (90 Base) MCG/ACT inhaler Inhale 1-2 puffs into the lungs every 6 (six) hours as needed for wheezing or shortness of breath. 12/06/19   McGowen, Adrian Blackwater, MD  alendronate (FOSAMAX) 70 MG tablet Take 1 tablet (70 mg total) by mouth every 7 (seven) days. Take with  a full glass of water on an empty stomach, first thing in the morning and remain up right for 30 minutes after taking. 07/26/19   McGowen, Adrian Blackwater, MD  ALPRAZolam Duanne Moron) 1 MG tablet TAKE 1 TABLET THREE TIMES DAILY AS NEEDED FOR ANXIETY. 05/29/20   McGowen, Adrian Blackwater, MD  aspirin 81 MG tablet Take 81 mg by mouth at bedtime.    [provider]  cyclobenzaprine (FLEXERIL) 5 MG tablet Take 1 tablet (5 mg total) by mouth 3 (three) times daily as needed for muscle spasms. 05/24/20   Meredith Staggers, MD  diclofenac sodium (VOLTAREN) 1 % GEL Apply 2 g topically 4 (four) times daily. Patient taking differently: Apply 2 g topically 4 (four) times daily as needed.  10/20/19   Meredith Staggers, MD  donepezil (ARICEPT) 10 MG tablet Take 1 tablet (10 mg total) by mouth at bedtime. 01/31/20   McGowen, Adrian Blackwater, MD  DULoxetine (CYMBALTA) 30 MG capsule TAKE 1 CAPSULE ONCE DAILY ALONG WITH 60MG  CAPSULE FOR TOTAL 90MG  DAILY. 04/17/20   McGowen, Adrian Blackwater, MD  DULoxetine (CYMBALTA) 60 MG capsule TAKE 1 CAPSULE A DAY TO BE COMBINED WITH 30MG  CAPSULE Patient taking differently: Take 60 mg by mouth daily. Take with 30mg  to total 90mg  daily. 07/27/19   McGowen, Adrian Blackwater, MD  EPINEPHrine 0.3 mg/0.3 mL IJ SOAJ injection Inject 0.3 mLs (0.3 mg total) into the muscle as needed for anaphylaxis. 01/22/20   Geradine Girt, DO  fluconazole (DIFLUCAN) 100 MG tablet TAKE (1) TABLET DAILY AS NEEDED FOR THRUSH  05/03/20   McGowen, Adrian Blackwater, MD  fluticasone (FLONASE) 50 MCG/ACT nasal spray Place 2 sprays into both nostrils daily. 09/27/19   McGowen, Adrian Blackwater, MD  furosemide (LASIX) 40 MG tablet Take 1 tablet (40 mg total) by mouth daily as needed for fluid. 10/07/19   Cherylann Ratel A, DO  ipratropium (ATROVENT) 0.03 % nasal spray Place 2 sprays into both nostrils every 12 (twelve) hours. 09/27/19   McGowen, Adrian Blackwater, MD  isosorbide mononitrate (IMDUR) 30 MG 24 hr tablet TAKE 2 TABLETS IN THE MORNING AND 1 TABLET IN THE EVENING. 05/24/20   Lelon Perla, MD  lamoTRIgine (LAMICTAL) 150 MG tablet TAKE 1 TABLET EACH DAY. 11/22/19   McGowen, Adrian Blackwater, MD  metoprolol succinate (TOPROL-XL) 50 MG 24 hr tablet TAKE (1) TABLET DAILY IN THE MORNING. Patient taking differently: Take 50 mg by mouth daily.  07/27/19   McGowen, Adrian Blackwater, MD  minocycline (MINOCIN) 50 MG capsule Take 50 mg by mouth 2 (two) times daily. 10/26/19   [provider]  Multiple Vitamin (MULTIVITAMIN WITH MINERALS) TABS tablet Take 1 tablet by mouth daily.    [provider]  nitroGLYCERIN (NITROSTAT) 0.4 MG SL tablet Place 1 tablet (0.4 mg total) under the tongue every 5 (five) minutes as needed for chest pain (x 3 doses). 09/23/19   Lelon Perla, MD  ondansetron (ZOFRAN) 8 MG tablet Take 1 tablet (8 mg total) by mouth every 8 (eight) hours as needed for nausea or vomiting. 10/22/19   Tammi Sou, MD  oxyCODONE-acetaminophen (PERCOCET) 10-325 MG tablet Every 6 hours as needed 05/24/20   Meredith Staggers, MD  pantoprazole (PROTONIX) 40 MG tablet TAKE 1 TABLET BY MOUTH DAILY. 12/10/19   McGowen, Adrian Blackwater, MD  rosuvastatin (CRESTOR) 20 MG tablet Take 1 tablet (20 mg total) by mouth daily. 09/23/19 02/28/28  Lelon Perla, MD  solifenacin (VESICARE) 10 MG tablet Take 1  tablet (10 mg total) by mouth daily. 04/28/20   McGowen, Adrian Blackwater, MD  traZODone (DESYREL) 50 MG tablet TAKE 2-4 TABLETS AT BEDTIME 12/10/19   McGowen, Adrian Blackwater, MD   Vitamin D, Ergocalciferol, (DRISDOL) 1.25 MG (50000 UT) CAPS capsule TAKE (1) CAPSULE TWICE A WEEK. 07/27/19   McGowen, Adrian Blackwater, MD     Critical care time: n/a    Noe Gens, MSN, NP-C [facility] 06/14/2020, 3:00 PM   Please see [email or web address] for pager details.

## 2020-06-14 NOTE — H&P (Addendum)
[facility] History and Physical  Linda [name] KVQ:[id number] DOB: February 20, 1946 DOA: 06/14/2020   PCP: Tammi Sou, MD  Specialists: None  Chief Complaint: Choked on food  HPI: Linda [name] is a 74 y.o. female with a past medical history of dementia, osteoporosis, osteoarthritis, degenerative disc disease, spinal stenosis, history of asbestosis, history of anxiety who lives in an independent living facility who was in her usual state of health till earlier today when she was eating her food and she started choking on the chicken.  Somebody at the facility had to perform Heimlich maneuver.  Some of the chicken was expelled.  Patient was subsequently noted to have some difficulty breathing with stridor.  EMS was called.  Patient was given oxygen and brought into the emergency department.  Patient mentions that she is feeling slightly better now.  Occasionally she has some difficulty breathing.  Her son is at the bedside.  She denies any chest pain.  She does have some pain in her upper abdomen where they performed the Heimlich maneuver.  Denies any nausea vomiting.  This has not happened previously.  Denies any headaches.  No passing out episodes.  In the emergency department she was seen by ENT due to stridor.  No foreign object was found on examination.  She was also seen by pulmonology.  She underwent a CT scan.  She will need to be observed overnight in the hospital.  She has been weaned off of oxygen.  Home Medications: Prior to Admission medications   Medication Sig Start Date End Date Taking? Authorizing Provider  albuterol (VENTOLIN HFA) 108 (90 Base) MCG/ACT inhaler Inhale 1-2 puffs into the lungs every 6 (six) hours as needed for wheezing or shortness of breath. 12/06/19   McGowen, Adrian Blackwater, MD  alendronate (FOSAMAX) 70 MG tablet Take 1 tablet (70 mg total) by mouth every 7 (seven) days. Take with a full glass of water on an empty stomach, first thing in the morning  and remain up right for 30 minutes after taking. 07/26/19   McGowen, Adrian Blackwater, MD  ALPRAZolam Duanne Moron) 1 MG tablet TAKE 1 TABLET THREE TIMES DAILY AS NEEDED FOR ANXIETY. 05/29/20   McGowen, Adrian Blackwater, MD  aspirin 81 MG tablet Take 81 mg by mouth at bedtime.    [provider]  cyclobenzaprine (FLEXERIL) 5 MG tablet Take 1 tablet (5 mg total) by mouth 3 (three) times daily as needed for muscle spasms. 05/24/20   Meredith Staggers, MD  diclofenac sodium (VOLTAREN) 1 % GEL Apply 2 g topically 4 (four) times daily. Patient taking differently: Apply 2 g topically 4 (four) times daily as needed.  10/20/19   Meredith Staggers, MD  donepezil (ARICEPT) 10 MG tablet Take 1 tablet (10 mg total) by mouth at bedtime. 01/31/20   McGowen, Adrian Blackwater, MD  DULoxetine (CYMBALTA) 30 MG capsule TAKE 1 CAPSULE ONCE DAILY ALONG WITH 60MG  CAPSULE FOR TOTAL 90MG  DAILY. 04/17/20   McGowen, Adrian Blackwater, MD  DULoxetine (CYMBALTA) 60 MG capsule TAKE 1 CAPSULE A DAY TO BE COMBINED WITH 30MG  CAPSULE Patient taking differently: Take 60 mg by mouth daily. Take with 30mg  to total 90mg  daily. 07/27/19   McGowen, Adrian Blackwater, MD  EPINEPHrine 0.3 mg/0.3 mL IJ SOAJ injection Inject 0.3 mLs (0.3 mg total) into the muscle as needed for anaphylaxis. 01/22/20   Geradine Girt, DO  fluconazole (DIFLUCAN) 100 MG tablet TAKE (1) TABLET DAILY AS NEEDED FOR THRUSH 05/03/20   McGowen, Arnette Norris  H, MD  fluticasone (FLONASE) 50 MCG/ACT nasal spray Place 2 sprays into both nostrils daily. 09/27/19   McGowen, Adrian Blackwater, MD  furosemide (LASIX) 40 MG tablet Take 1 tablet (40 mg total) by mouth daily as needed for fluid. 10/07/19   Cherylann Ratel A, DO  ipratropium (ATROVENT) 0.03 % nasal spray Place 2 sprays into both nostrils every 12 (twelve) hours. 09/27/19   McGowen, Adrian Blackwater, MD  isosorbide mononitrate (IMDUR) 30 MG 24 hr tablet TAKE 2 TABLETS IN THE MORNING AND 1 TABLET IN THE EVENING. 05/24/20   Lelon Perla, MD  lamoTRIgine (LAMICTAL) 150 MG tablet TAKE 1 TABLET  EACH DAY. 11/22/19   McGowen, Adrian Blackwater, MD  metoprolol succinate (TOPROL-XL) 50 MG 24 hr tablet TAKE (1) TABLET DAILY IN THE MORNING. Patient taking differently: Take 50 mg by mouth daily.  07/27/19   McGowen, Adrian Blackwater, MD  minocycline (MINOCIN) 50 MG capsule Take 50 mg by mouth 2 (two) times daily. 10/26/19   [provider]  Multiple Vitamin (MULTIVITAMIN WITH MINERALS) TABS tablet Take 1 tablet by mouth daily.    [provider]  nitroGLYCERIN (NITROSTAT) 0.4 MG SL tablet Place 1 tablet (0.4 mg total) under the tongue every 5 (five) minutes as needed for chest pain (x 3 doses). 09/23/19   Lelon Perla, MD  ondansetron (ZOFRAN) 8 MG tablet Take 1 tablet (8 mg total) by mouth every 8 (eight) hours as needed for nausea or vomiting. 10/22/19   Tammi Sou, MD  oxyCODONE-acetaminophen (PERCOCET) 10-325 MG tablet Every 6 hours as needed 05/24/20   Meredith Staggers, MD  pantoprazole (PROTONIX) 40 MG tablet TAKE 1 TABLET BY MOUTH DAILY. 12/10/19   McGowen, Adrian Blackwater, MD  rosuvastatin (CRESTOR) 20 MG tablet Take 1 tablet (20 mg total) by mouth daily. 09/23/19 02/28/28  Lelon Perla, MD  solifenacin (VESICARE) 10 MG tablet Take 1 tablet (10 mg total) by mouth daily. 04/28/20   McGowen, Adrian Blackwater, MD  traZODone (DESYREL) 50 MG tablet TAKE 2-4 TABLETS AT BEDTIME 12/10/19   McGowen, Adrian Blackwater, MD  Vitamin D, Ergocalciferol, (DRISDOL) 1.25 MG (50000 UT) CAPS capsule TAKE (1) CAPSULE TWICE A WEEK. 07/27/19   McGowen, Adrian Blackwater, MD    Allergies:  Allergies  Allergen Reactions  . Abilify [Aripiprazole] Other (See Comments)    parkinsonism  . Penicillins Itching, Swelling and Rash    Tolerated Cefepime in ED. Has patient had a PCN reaction causing immediate rash, facial/tongue/throat swelling, SOB or lightheadedness with hypotension: Yes Has patient had a PCN reaction causing severe rash involving mucus membranes or skin necrosis: No Has patient had a PCN reaction that required  hospitalization: No  Has patient had a PCN reaction occurring within the last 10 years: No     Past Medical History: Past Medical History:  Diagnosis Date  . Acute upper GI bleed 06/2014   while pt taking coumadin, plavix, and meloxicam---despite being told not to take coumadin.  . Anginal pain ([place])    Nonobstructive CAD 2014; however, her cardiologist put her on a statin for this and NOT for hyperlipidemia per pt report.  Atyp CP 08/2017 at card f/u, plan for myoc perf imaging.  . Anxiety    panic attacks  . Asthma    w/ asbestososis   . BPPV (benign paroxysmal positional vertigo) 12/16/2012  . Chronic diastolic CHF (congestive heart failure) (HCC)    dry wt as of 11/06/16 is 168 lbs.  . Chronic lower back  pain   . COPD (chronic obstructive pulmonary disease) ([place])   . DDD (degenerative disc disease)    lumbar and cervical.   . Diverticular disease   . Fibromyalgia    Patient states dx was around her late 8s but she had sx's for years prior to this.  . H/O hiatal hernia   . History of pneumonia    hospitalized 12/2011, 02/2013, and 07/2013 [facility]) for this  . HTN (hypertension)    Renal artery dopplers 04/2013 neg for stenosis.  . Hypervitaminosis D 09/27/2019   over-supplemented.  Stopped vit D and plan recheck 2 mo.  . Idiopathic angio-edema-urticaria [id number]; 2021   Angioedema component was very minimal.  2021->Dr. Bobbitt (allergist) eval.  . Insomnia   . Iron deficiency anemia    Hematologist in [place], [place] did extensive w/u; no cause found; failed oral supplement;; gets fairly regular (q78m or so) IV iron infusions (Venofer -iron sucrose- 200mg  with procrit.  "for 14 yr I've been getting blood work q month & getting infusions prn" (07/12/2013).  Dr. Marin Olp locally, iron infusions done, EPO deficiency dx'd  . Migraine syndrome    "not as often anymore; used to be ~ q wk" (07/12/2013)  . Mixed incontinence urge and stress   . Nephrolithiasis    "passed all on my own or they  are still in there" (07/12/2013)  . Neuroleptic induced Parkinsonism ([place]) 2018   Dr. Carles Collet, neuro, saw her 11/24/17 and recommended d/c of abilify as first step.  D/c'd abilify and pt got complete recovery.  . OSA on CPAP    prior to move to [place]--had another sleep study 10/2015 w/pulm Dr. Camillo Flaming.  . Osteoarthritis    "severe; progressing fast" (07/12/2013); multiple joints-not surgical candidate for TKR (03/2015).  Triamcinolon knee injections by Dr. Tessa Lerner 12/2017.  [name] Kitchen Pernicious anemia 08/24/2014  . Pleural plaque with presence of asbestos 07/22/2013  . Pulmonary embolism ([place]) 07/2013   Dx at [facility] with very small peripheral upper lobe pe 07/2013: pt took coumadin for about 8-9 mo  . Pyelonephritis    "several times over the last 30 yr" (07/12/2013)  . RBBB (right bundle branch block)   . Recurrent major depression ([place])   . Recurrent UTI    hx of hospitalization for pyelonephritis; started abx prophylaxis 06/2015  . Syncope    Hypotensive; ED visit--Dr. Terrence Dupont did Cath--nonobstructive CAD, EF 55-60%.  In retrospect, suspect pt rx med misuse/polypharmacy    Past Surgical History:  Procedure Laterality Date  . APPENDECTOMY  1960  . AXILLARY SURGERY Left 1978   Multiple "lump" in armpit per pt  . CARDIAC CATHETERIZATION  01/2013   nonobstructive CAD, EF 55-60%  . CARDIOVASCULAR STRESS TEST  02/22/15   Low risk myocard perf imaging; wall motion normal, normal EF  . carotid duplex doppler  10/21/2017   R vertebral flow suggestive of possible distal obstruction.  Pt declined further w/u as of 10/29/17 but need to revisit this problem periodically.  . COCCYX REMOVAL  1972  . DEXA  06/05/2017   T-score -3.1  . DILATION AND CURETTAGE OF UTERUS  ? 1970's  . ESOPHAGOGASTRODUODENOSCOPY N/A 07/19/2014   Gastritis found + in the setting of supratherapeutic INR, +plavix, + meloxicam.  . EYE SURGERY Left 2012-2013   "injections for ~ 1 yr; don't really know what for" (07/12/2013)  . HEEL SPUR SURGERY Left 2008   . KNEE SURGERY  2005  . LEFT HEART CATHETERIZATION WITH CORONARY ANGIOGRAM N/A 01/30/2013   Procedure:  LEFT HEART CATHETERIZATION WITH CORONARY ANGIOGRAM;  Surgeon: Clent Demark, MD;  Location: [facility] CATH LAB;  Service: Cardiovascular;  Laterality: N/A;  . PLANTAR FASCIA RELEASE Left 2008  . SPIROMETRY  04/25/14   In hosp for acute asthma/COPD flare: mixed obstructive and restrictive lung disease. The FEV1 is severely reduced at 45% predicted.  FEV1 signif decreased compared to prior spirometry 07/23/13.  . TENDON RELEASE  1996   Right forearm and hand  . TOTAL ABDOMINAL HYSTERECTOMY  1974  . TRANSTHORACIC ECHOCARDIOGRAM  01/2013; 04/2014;08/2015; 09/2017   2014--NORMAL.  2015--focal basal septal hypertrophy, EF 55-60%, grade I diast dysfxn, mild LAE.  08/2015 EF 55-60%, nl LV syst fxn, grade I DD, valves wnl. 10/21/17: EF 65-70%, grd I DD, o/w normal.    Social History: She lives in an independent living facility.  No history of smoking alcohol use or illicit drug use.  Usually independent with ambulation.  Family History:  Family History  Problem Relation Age of Onset  . Arthritis Mother   . Kidney disease Mother   . Heart disease Father   . Stroke Father   . Hypertension Father   . Diabetes Father   . Heart attack Father   . Heart attack Paternal Grandmother   . Diabetes Sister        one sister  . Hypertension Sister   . Asthma Sister   . Hypertension Brother   . Asthma Brother   . Asthma Daughter   . Multiple sclerosis Son      Review of Systems - History obtained from the patient General ROS: negative Psychological ROS: negative Ophthalmic ROS: negative ENT ROS: As in HPI Allergy and Immunology ROS: negative Hematological and Lymphatic ROS: negative Endocrine ROS: negative Respiratory ROS: As in HPI Cardiovascular ROS: no chest pain or dyspnea on exertion Gastrointestinal ROS: no abdominal pain, change in bowel habits, or black or bloody stools Genito-Urinary ROS: no  dysuria, trouble voiding, or hematuria Musculoskeletal ROS: negative Neurological ROS: no TIA or stroke symptoms Dermatological ROS: negative  Physical Examination  Vitals:   06/14/20 1500 06/14/20 1515 06/14/20 1547 06/14/20 1706  BP: 125/72 126/76 (!) 161/101 122/68  Pulse: 65 63 95 71  Resp: 14 14 15 15   Temp:      TempSrc:      SpO2: 97% 100% 100% 94%  Weight:      Height:        BP 122/68 (BP Location: Left Arm)   Pulse 71   Temp 97.9 F (36.6 C) (Oral)   Resp 15   Ht 5\' 3"  (1.6 m)   Wt 66.2 kg   SpO2 94%   BMI 25.86 kg/m   General appearance: alert, cooperative, appears stated age and no distress Head: Normocephalic, without obvious abnormality, atraumatic Eyes: conjunctivae/corneas clear. PERRL, EOM's intact.  Nose: Nares normal. Septum midline. Mucosa normal. No drainage or sinus tenderness. Throat: lips, mucosa, and tongue normal; teeth and gums normal and No oral lesions identified. Neck: no adenopathy, no carotid bruit, no JVD, supple, symmetrical, trachea midline, thyroid not enlarged, symmetric, no tenderness/mass/nodules and No stridor heard Resp: Normal effort.  Coarse breath sounds with few crackles at the bases.  No wheezing or rhonchi. Cardio: regular rate and rhythm, S1, S2 normal, no murmur, click, rub or gallop GI: soft, non-tender; bowel sounds normal; no masses,  no organomegaly Extremities: extremities normal, atraumatic, no cyanosis or edema Pulses: 2+ and symmetric Skin: Skin color, texture, turgor normal. No rashes or lesions Lymph nodes:  Cervical, supraclavicular, and axillary nodes normal. Neurologic: Awake alert.  No obvious focal neurological deficits.  Mildly distracted.   Labs on Admission: I have personally reviewed following labs and imaging studies  CBC: Recent Labs  Lab 06/14/20 1450  WBC 8.0  NEUTROABS 4.4  HGB 13.5  HCT 41.4  MCV 91.2  PLT 295   Basic Metabolic Panel: Recent Labs  Lab 06/14/20 1450  NA 138  K 4.1    CL 104  CO2 25  GLUCOSE 106*  BUN 20  CREATININE 0.85  CALCIUM 8.6*   GFR: Estimated Creatinine Clearance: 53.1 mL/min (by C-G formula based on SCr of 0.85 mg/dL).   Radiological Exams on Admission: DG Neck Soft Tissue  Result Date: 06/14/2020 CLINICAL DATA:  Choking episode. EXAM: NECK SOFT TISSUES - 1+ VIEW COMPARISON:  Chest x-ray dated December 06, 2019. CT cervical spine dated October 21, 2017. FINDINGS: There is no evidence of retropharyngeal soft tissue swelling or epiglottic enlargement. The cervical airway is unremarkable and no radio-opaque foreign body identified. Bilateral carotid artery calcific atherosclerosis. Bilateral calcified pleural plaques in the lungs again noted. IMPRESSION: 1. Negative.  No radiopaque foreign body identified. Electronically Signed   By: Titus Dubin M.D.   On: 06/14/2020 14:39   CT CHEST WO CONTRAST  Result Date: 06/14/2020 CLINICAL DATA:  74 year old female with history of aspiration. EXAM: CT CHEST WITHOUT CONTRAST TECHNIQUE: Multidetector CT imaging of the chest was performed following the standard protocol without IV contrast. COMPARISON:  Chest CT 01/01/2016. FINDINGS: Cardiovascular: Heart size is normal. There is no significant pericardial fluid, thickening or pericardial calcification. There is aortic atherosclerosis, as well as atherosclerosis of the great vessels of the mediastinum and the coronary arteries, including calcified atherosclerotic plaque in the left main, left anterior descending, left circumflex and right coronary arteries. Mediastinum/Nodes: No pathologically enlarged mediastinal or hilar lymph nodes. Please note that accurate exclusion of hilar adenopathy is limited on noncontrast CT scans. Esophagus is unremarkable in appearance. No axillary lymphadenopathy. Lungs/Pleura: Numerous calcified pleural plaques bilaterally, indicative of asbestos related pleural disease. No pleural effusions. No suspicious appearing pulmonary  nodules or masses are noted. No acute consolidative airspace disease. Some peripheral predominant areas of ground-glass attenuation and parenchymal banding throughout the mid to lower lungs bilaterally. No honeycombing. Upper Abdomen: Aortic atherosclerosis. Musculoskeletal: New compression fractures of T7 and T9, most severe at T9 with 40% loss of anterior vertebral body height. There are no aggressive appearing lytic or blastic lesions noted in the visualized portions of the skeleton. IMPRESSION: 1. No imaging findings to suggest significant aspiration. 2. Asbestos related pleural disease with imaging findings in the lung bases bilaterally concerning for developing interstitial lung disease such as asbestosis. Outpatient referral to Pulmonology for appropriate follow-up is recommended. 3. Aortic atherosclerosis, in addition to left main and 3 vessel coronary artery disease. Assessment for potential risk factor modification, dietary therapy or pharmacologic therapy may be warranted, if clinically indicated. 4. New compression fractures of T7 and T9, most severe at T9 where there is 40% loss of anterior vertebral body height. Aortic Atherosclerosis (ICD10-I70.0). Electronically Signed   By: Vinnie Langton M.D.   On: 06/14/2020 16:17   DG Chest Portable 1 View  Result Date: 06/14/2020 CLINICAL DATA:  Choking episode today. EXAM: PORTABLE CHEST 1 VIEW COMPARISON:  Single-view of the chest 12/06/2019. FINDINGS: Calcified pleural plaques are again seen. Lungs are clear. Heart size is enlarged. Aortic atherosclerosis. No pneumothorax or pleural fluid. IMPRESSION: No acute disease. Calcified pleural plaques consistent  with prior asbestos exposure. Cardiomegaly. Aortic Atherosclerosis (ICD10-I70.0). Electronically Signed   By: Inge Rise M.D.   On: 06/14/2020 14:37    Problem List  Active Problems:   Choking due to food (regurgitated)   Assessment: This is a 74 year old Caucasian female who lives in an  independent living facility at [facility] who comes in after she choked on chicken.  Heimlich maneuver had to be performed.  She was noted to be stridorous afterwards.  She was brought into the hospital.  She will need to be observed overnight.  Plan:  1. Choking episode due to food: Currently she does not have any stridor.  She has been weaned off of oxygen.  She has been observed to have stridor once in a while.  She seems to be quite stable at this time.  Pulmonology wants her to be observed overnight.  If she remains stable then she can be sent back to her facility tomorrow.  If not she may need a bronchoscopy tomorrow morning.  We will place her on liquid diet for now and n.p.o. past midnight.  May also consider having her seen by speech therapy tomorrow.  2. History of osteoarthritis/degenerative disc disease/osteoporosis/vertebral compression fractures: Stable.  She is usually independent with ambulation.  Once in a while she uses her cane or walker.  Continue with her home medications.  She tells me that she takes Percocet 10 mg up to every 6 hours as needed for her back problems.  Hold her bisphosphonate while she is in the hospital.  New T7 and T9 compression fractures noted on CT scan.  She does not have any acute pain at this time. Mobilize as tolerated.  3.  History of anxiety: Continue with Cymbalta and alprazolam as needed. Also noted to be on lamictal.  4. History of asbestosis: Pleural disease noted on CT scan.  Defer to primary care provider.  5. History of essential hypertension: Continue with metoprolol.  She is also on Imdur.  6.  History of hyperlipidemia: On Crestor.  7.  History of urge incontinence/overactive bladder: Continue with Vesicare.   DVT Prophylaxis: SCDs for now Code Status: Full code Family Communication: Discussed with patient and her son Disposition: Hopefully return home to her independent living facility Consults called: Pulmonology.   ENT. Admission Status: Status is: Observation  The patient remains OBS appropriate and will d/c before 2 midnights.  Dispo: The patient is from: Independent living at [facility]              Anticipated d/c is to: Back to her independent living              Anticipated d/c date is: 1 day              Patient currently is not medically stable to d/c.     Severity of Illness: The appropriate patient status for this patient is OBSERVATION. Observation status is judged to be reasonable and necessary in order to provide the required intensity of service to ensure the patient's safety. The patient's presenting symptoms, physical exam findings, and initial radiographic and laboratory data in the context of their medical condition is felt to place them at decreased risk for further clinical deterioration. Furthermore, it is anticipated that the patient will be medically stable for discharge from the hospital within 2 midnights of admission. The following factors support the patient status of observation.   " The patient's presenting symptoms include choking. " The physical exam findings include occasional  stridor. " The initial radiographic and laboratory data are unremarkable for any acute findings.   Further management decisions will depend on results of further testing and patient's response to treatment.   Tag Wurtz [employer]  [facility] operational officer on [email or web address]  06/14/2020, 5:28 PM

## 2020-06-14 NOTE — ED Notes (Signed)
M.Denny FNP contacted and pt room changed to Med-Surg from Telemetry.

## 2020-06-14 NOTE — ED Triage Notes (Signed)
BIB EMS from [facility], eating lunch, choked on piece of chicken, Heimlich maneuver with some expellison of chicken, still has partial airway obstruction, stridor breath sounds. Pt sitting up, able to speak a dew words, gasping resp.

## 2020-06-15 ENCOUNTER — Encounter (HOSPITAL_COMMUNITY): Payer: Self-pay | Admitting: Anesthesiology

## 2020-06-15 ENCOUNTER — Observation Stay (HOSPITAL_COMMUNITY): Payer: Medicare Other

## 2020-06-15 ENCOUNTER — Encounter (HOSPITAL_COMMUNITY)
Admission: EM | Disposition: A | Discharge status: Skilled Nursing Facility | Payer: Self-pay | Source: Home / Self Care | Attending: Emergency Medicine

## 2020-06-15 ENCOUNTER — Inpatient Hospital Stay (HOSPITAL_COMMUNITY)
Admission: EM | Admit: 2020-06-15 | Discharge: 2020-06-20 | DRG: 189 | Disposition: A | Discharge status: Home / Self Care | Payer: Medicare Other | Attending: Internal Medicine | Admitting: Internal Medicine

## 2020-06-15 ENCOUNTER — Encounter (HOSPITAL_COMMUNITY): Payer: Self-pay | Admitting: Emergency Medicine

## 2020-06-15 ENCOUNTER — Emergency Department (HOSPITAL_COMMUNITY): Payer: Medicare Other

## 2020-06-15 DIAGNOSIS — J9621 Acute and chronic respiratory failure with hypoxia: Principal | ICD-10-CM | POA: Diagnosis present

## 2020-06-15 DIAGNOSIS — I10 Essential (primary) hypertension: Secondary | ICD-10-CM | POA: Diagnosis not present

## 2020-06-15 DIAGNOSIS — G4733 Obstructive sleep apnea (adult) (pediatric): Secondary | ICD-10-CM | POA: Diagnosis present

## 2020-06-15 DIAGNOSIS — K219 Gastro-esophageal reflux disease without esophagitis: Secondary | ICD-10-CM | POA: Diagnosis present

## 2020-06-15 DIAGNOSIS — M1990 Unspecified osteoarthritis, unspecified site: Secondary | ICD-10-CM | POA: Diagnosis present

## 2020-06-15 DIAGNOSIS — F329 Major depressive disorder, single episode, unspecified: Secondary | ICD-10-CM | POA: Diagnosis not present

## 2020-06-15 DIAGNOSIS — D72829 Elevated white blood cell count, unspecified: Secondary | ICD-10-CM | POA: Diagnosis present

## 2020-06-15 DIAGNOSIS — G2111 Neuroleptic induced parkinsonism: Secondary | ICD-10-CM | POA: Diagnosis present

## 2020-06-15 DIAGNOSIS — Z79899 Other long term (current) drug therapy: Secondary | ICD-10-CM | POA: Diagnosis not present

## 2020-06-15 DIAGNOSIS — Z823 Family history of stroke: Secondary | ICD-10-CM

## 2020-06-15 DIAGNOSIS — T17928A Food in respiratory tract, part unspecified causing other injury, initial encounter: Secondary | ICD-10-CM | POA: Diagnosis present

## 2020-06-15 DIAGNOSIS — K2950 Unspecified chronic gastritis without bleeding: Secondary | ICD-10-CM | POA: Diagnosis not present

## 2020-06-15 DIAGNOSIS — M797 Fibromyalgia: Secondary | ICD-10-CM | POA: Diagnosis present

## 2020-06-15 DIAGNOSIS — Z20822 Contact with and (suspected) exposure to covid-19: Secondary | ICD-10-CM | POA: Diagnosis present

## 2020-06-15 DIAGNOSIS — J441 Chronic obstructive pulmonary disease with (acute) exacerbation: Secondary | ICD-10-CM | POA: Diagnosis present

## 2020-06-15 DIAGNOSIS — R061 Stridor: Secondary | ICD-10-CM | POA: Diagnosis present

## 2020-06-15 DIAGNOSIS — F339 Major depressive disorder, recurrent, unspecified: Secondary | ICD-10-CM | POA: Diagnosis present

## 2020-06-15 DIAGNOSIS — J383 Other diseases of vocal cords: Secondary | ICD-10-CM | POA: Diagnosis present

## 2020-06-15 DIAGNOSIS — Z86711 Personal history of pulmonary embolism: Secondary | ICD-10-CM | POA: Diagnosis present

## 2020-06-15 DIAGNOSIS — I110 Hypertensive heart disease with heart failure: Secondary | ICD-10-CM | POA: Diagnosis present

## 2020-06-15 DIAGNOSIS — Z880 Allergy status to penicillin: Secondary | ICD-10-CM

## 2020-06-15 DIAGNOSIS — R1310 Dysphagia, unspecified: Secondary | ICD-10-CM

## 2020-06-15 DIAGNOSIS — T380X5A Adverse effect of glucocorticoids and synthetic analogues, initial encounter: Secondary | ICD-10-CM | POA: Diagnosis present

## 2020-06-15 DIAGNOSIS — G894 Chronic pain syndrome: Secondary | ICD-10-CM | POA: Diagnosis present

## 2020-06-15 DIAGNOSIS — J449 Chronic obstructive pulmonary disease, unspecified: Secondary | ICD-10-CM | POA: Diagnosis present

## 2020-06-15 DIAGNOSIS — F419 Anxiety disorder, unspecified: Secondary | ICD-10-CM | POA: Diagnosis present

## 2020-06-15 DIAGNOSIS — Z9981 Dependence on supplemental oxygen: Secondary | ICD-10-CM

## 2020-06-15 DIAGNOSIS — E785 Hyperlipidemia, unspecified: Secondary | ICD-10-CM | POA: Diagnosis present

## 2020-06-15 DIAGNOSIS — R1314 Dysphagia, pharyngoesophageal phase: Secondary | ICD-10-CM | POA: Diagnosis present

## 2020-06-15 DIAGNOSIS — Z8249 Family history of ischemic heart disease and other diseases of the circulatory system: Secondary | ICD-10-CM

## 2020-06-15 DIAGNOSIS — Z841 Family history of disorders of kidney and ureter: Secondary | ICD-10-CM

## 2020-06-15 DIAGNOSIS — Z77090 Contact with and (suspected) exposure to asbestos: Secondary | ICD-10-CM | POA: Diagnosis present

## 2020-06-15 DIAGNOSIS — Z7983 Long term (current) use of bisphosphonates: Secondary | ICD-10-CM

## 2020-06-15 DIAGNOSIS — I5032 Chronic diastolic (congestive) heart failure: Secondary | ICD-10-CM | POA: Diagnosis present

## 2020-06-15 DIAGNOSIS — F0390 Unspecified dementia without behavioral disturbance: Secondary | ICD-10-CM | POA: Diagnosis present

## 2020-06-15 DIAGNOSIS — Z7982 Long term (current) use of aspirin: Secondary | ICD-10-CM | POA: Diagnosis not present

## 2020-06-15 DIAGNOSIS — Z820 Family history of epilepsy and other diseases of the nervous system: Secondary | ICD-10-CM

## 2020-06-15 DIAGNOSIS — N3281 Overactive bladder: Secondary | ICD-10-CM | POA: Diagnosis present

## 2020-06-15 DIAGNOSIS — D510 Vitamin B12 deficiency anemia due to intrinsic factor deficiency: Secondary | ICD-10-CM | POA: Diagnosis present

## 2020-06-15 DIAGNOSIS — Z825 Family history of asthma and other chronic lower respiratory diseases: Secondary | ICD-10-CM

## 2020-06-15 DIAGNOSIS — R1312 Dysphagia, oropharyngeal phase: Secondary | ICD-10-CM | POA: Diagnosis not present

## 2020-06-15 DIAGNOSIS — M810 Age-related osteoporosis without current pathological fracture: Secondary | ICD-10-CM | POA: Diagnosis present

## 2020-06-15 DIAGNOSIS — M4854XA Collapsed vertebra, not elsewhere classified, thoracic region, initial encounter for fracture: Secondary | ICD-10-CM | POA: Diagnosis present

## 2020-06-15 DIAGNOSIS — J439 Emphysema, unspecified: Secondary | ICD-10-CM | POA: Diagnosis not present

## 2020-06-15 DIAGNOSIS — I4510 Unspecified right bundle-branch block: Secondary | ICD-10-CM | POA: Diagnosis present

## 2020-06-15 DIAGNOSIS — G8929 Other chronic pain: Secondary | ICD-10-CM | POA: Diagnosis present

## 2020-06-15 DIAGNOSIS — T17320A Food in larynx causing asphyxiation, initial encounter: Secondary | ICD-10-CM | POA: Diagnosis not present

## 2020-06-15 DIAGNOSIS — K3189 Other diseases of stomach and duodenum: Secondary | ICD-10-CM | POA: Diagnosis not present

## 2020-06-15 DIAGNOSIS — Z833 Family history of diabetes mellitus: Secondary | ICD-10-CM

## 2020-06-15 LAB — CBC
HCT: 42.6 % (ref 36.0–46.0)
HCT: 45.7 % (ref 36.0–46.0)
Hemoglobin: 13.6 g/dL (ref 12.0–15.0)
Hemoglobin: 14.6 g/dL (ref 12.0–15.0)
MCH: 29.2 pg (ref 26.0–34.0)
MCH: 28.7 pg (ref 26.0–34.0)
MCHC: 31.9 g/dL (ref 30.0–36.0)
MCHC: 31.9 g/dL (ref 30.0–36.0)
MCV: 91.4 fL (ref 80.0–100.0)
MCV: 89.8 fL (ref 80.0–100.0)
Platelets: 191 10*3/uL (ref 150–400)
Platelets: 215 10*3/uL (ref 150–400)
RBC: 4.66 MIL/uL (ref 3.87–5.11)
RBC: 5.09 MIL/uL (ref 3.87–5.11)
RDW: 13.2 % (ref 11.5–15.5)
RDW: 13 % (ref 11.5–15.5)
WBC: 7.9 10*3/uL (ref 4.0–10.5)
WBC: 13.8 10*3/uL — ABNORMAL HIGH (ref 4.0–10.5)
nRBC: 0 % (ref 0.0–0.2)
nRBC: 0 % (ref 0.0–0.2)

## 2020-06-15 LAB — BASIC METABOLIC PANEL
Anion gap: 10 (ref 5–15)
Anion gap: 7 (ref 5–15)
BUN: 17 mg/dL (ref 8–23)
BUN: 20 mg/dL (ref 8–23)
CO2: 24 mmol/L (ref 22–32)
CO2: 27 mmol/L (ref 22–32)
Calcium: 8.7 mg/dL — ABNORMAL LOW (ref 8.9–10.3)
Calcium: 8.8 mg/dL — ABNORMAL LOW (ref 8.9–10.3)
Chloride: 103 mmol/L (ref 98–111)
Chloride: 101 mmol/L (ref 98–111)
Creatinine, Ser: 0.75 mg/dL (ref 0.44–1.00)
Creatinine, Ser: 0.82 mg/dL (ref 0.44–1.00)
GFR calc Af Amer: >60 mL/min (ref >60)
GFR calc Af Amer: >60 mL/min (ref >60)
GFR calc non Af Amer: >60 mL/min (ref >60)
GFR calc non Af Amer: >60 mL/min (ref >60)
Glucose, Bld: 132 mg/dL — ABNORMAL HIGH (ref 70–99)
Glucose, Bld: 108 mg/dL — ABNORMAL HIGH (ref 70–99)
Potassium: 4.3 mmol/L (ref 3.5–5.1)
Potassium: 4.1 mmol/L (ref 3.5–5.1)
Sodium: 137 mmol/L (ref 135–145)
Sodium: 135 mmol/L (ref 135–145)

## 2020-06-15 LAB — TROPONIN I (HIGH SENSITIVITY)
Troponin I (High Sensitivity): <2 ng/L (ref <18)
Troponin I (High Sensitivity): 2 ng/L (ref <18)

## 2020-06-15 SURGERY — CANCELLED PROCEDURE

## 2020-06-15 MED ORDER — LAMOTRIGINE 25 MG PO TABS
150.0000 mg | ORAL_TABLET | Freq: Every day | ORAL | Status: DC
  Administered 2020-06-16 – 2020-06-20 (×5): 150 mg via ORAL
  Filled 2020-06-15: qty 1
  Filled 2020-06-15: qty 2
  Filled 2020-06-15 (×3): qty 1

## 2020-06-15 MED ORDER — ROSUVASTATIN CALCIUM 20 MG PO TABS
20.0000 mg | ORAL_TABLET | Freq: Every day | ORAL | Status: DC
  Administered 2020-06-17 – 2020-06-20 (×4): 20 mg via ORAL
  Filled 2020-06-15 (×6): qty 1

## 2020-06-15 MED ORDER — PROPOFOL 1000 MG/100ML IV EMUL
INTRAVENOUS | Status: AC
  Filled 2020-06-15: qty 100

## 2020-06-15 MED ORDER — ACETAMINOPHEN 325 MG PO TABS: 650.0000 mg | ORAL_TABLET | Freq: Four times a day (QID) | ORAL | Status: DC | PRN

## 2020-06-15 MED ORDER — PROPOFOL 10 MG/ML IV BOLUS
INTRAVENOUS | Status: AC
  Filled 2020-06-15: qty 20

## 2020-06-15 MED ORDER — IPRATROPIUM-ALBUTEROL 0.5-2.5 (3) MG/3ML IN SOLN
3.0000 mL | Freq: Four times a day (QID) | RESPIRATORY_TRACT | Status: DC
  Administered 2020-06-15 – 2020-06-16 (×2): 3 mL via RESPIRATORY_TRACT
  Filled 2020-06-15 (×2): qty 3

## 2020-06-15 MED ORDER — PROPOFOL 500 MG/50ML IV EMUL
INTRAVENOUS | Status: AC
  Filled 2020-06-15: qty 100

## 2020-06-15 MED ORDER — DEXAMETHASONE SODIUM PHOSPHATE 10 MG/ML IJ SOLN
10.0000 mg | Freq: Once | INTRAMUSCULAR | Status: AC
  Administered 2020-06-15: 10 mg via INTRAVENOUS
  Filled 2020-06-15: qty 1

## 2020-06-15 MED ORDER — FENTANYL CITRATE (PF) 100 MCG/2ML IJ SOLN
INTRAMUSCULAR | Status: AC
  Filled 2020-06-15: qty 2

## 2020-06-15 MED ORDER — METHYLPREDNISOLONE SODIUM SUCC 40 MG IJ SOLR
40.0000 mg | Freq: Four times a day (QID) | INTRAMUSCULAR | Status: DC
  Administered 2020-06-15 – 2020-06-18 (×12): 40 mg via INTRAVENOUS
  Filled 2020-06-15 (×12): qty 1

## 2020-06-15 MED ORDER — ACETAMINOPHEN 650 MG RE SUPP: 650.0000 mg | Freq: Four times a day (QID) | RECTAL | Status: DC | PRN

## 2020-06-15 MED ORDER — HYDRALAZINE HCL 25 MG PO TABS: 25.0000 mg | ORAL_TABLET | Freq: Four times a day (QID) | ORAL | Status: DC | PRN

## 2020-06-15 MED ORDER — DOCUSATE SODIUM 100 MG PO CAPS
100.0000 mg | ORAL_CAPSULE | Freq: Two times a day (BID) | ORAL | Status: DC
  Administered 2020-06-15 – 2020-06-20 (×10): 100 mg via ORAL
  Filled 2020-06-15 (×10): qty 1

## 2020-06-15 MED ORDER — RACEPINEPHRINE HCL 2.25 % IN NEBU
0.5000 mL | INHALATION_SOLUTION | Freq: Once | RESPIRATORY_TRACT | Status: AC
  Administered 2020-06-15: 0.5 mL via RESPIRATORY_TRACT
  Filled 2020-06-15: qty 0.5

## 2020-06-15 MED ORDER — OXYCODONE HCL 5 MG PO TABS
5.0000 mg | ORAL_TABLET | Freq: Four times a day (QID) | ORAL | Status: DC | PRN
  Administered 2020-06-15 – 2020-06-20 (×11): 5 mg via ORAL
  Filled 2020-06-15 (×11): qty 1

## 2020-06-15 MED ORDER — ASPIRIN EC 81 MG PO TBEC
81.0000 mg | DELAYED_RELEASE_TABLET | Freq: Every day | ORAL | Status: DC
  Administered 2020-06-17 – 2020-06-19 (×3): 81 mg via ORAL
  Filled 2020-06-15 (×3): qty 1

## 2020-06-15 MED ORDER — ONDANSETRON HCL 4 MG/2ML IJ SOLN: 4.0000 mg | Freq: Four times a day (QID) | INTRAMUSCULAR | Status: DC | PRN

## 2020-06-15 MED ORDER — RACEPINEPHRINE HCL 2.25 % IN NEBU
0.5000 mL | INHALATION_SOLUTION | RESPIRATORY_TRACT | Status: DC | PRN
  Filled 2020-06-15: qty 0.5

## 2020-06-15 MED ORDER — TRAZODONE HCL 50 MG PO TABS
150.0000 mg | ORAL_TABLET | Freq: Every day | ORAL | Status: DC
  Administered 2020-06-15 – 2020-06-19 (×5): 150 mg via ORAL
  Filled 2020-06-15: qty 2
  Filled 2020-06-15 (×4): qty 1

## 2020-06-15 MED ORDER — PANTOPRAZOLE SODIUM 40 MG PO TBEC
40.0000 mg | DELAYED_RELEASE_TABLET | Freq: Two times a day (BID) | ORAL | Status: DC
  Administered 2020-06-15 – 2020-06-20 (×9): 40 mg via ORAL
  Filled 2020-06-15 (×9): qty 1

## 2020-06-15 MED ORDER — SODIUM CHLORIDE 0.9% FLUSH: 3.0000 mL | Freq: Once | INTRAVENOUS | Status: DC

## 2020-06-15 MED ORDER — OXYCODONE-ACETAMINOPHEN 5-325 MG PO TABS
1.0000 | ORAL_TABLET | Freq: Four times a day (QID) | ORAL | Status: DC | PRN
  Administered 2020-06-15 – 2020-06-20 (×11): 1 via ORAL
  Filled 2020-06-15 (×11): qty 1

## 2020-06-15 MED ORDER — ISOSORBIDE MONONITRATE ER 30 MG PO TB24
30.0000 mg | ORAL_TABLET | Freq: Every evening | ORAL | Status: DC
  Administered 2020-06-15 – 2020-06-19 (×5): 30 mg via ORAL
  Filled 2020-06-15 (×6): qty 1

## 2020-06-15 MED ORDER — OXYCODONE-ACETAMINOPHEN 10-325 MG PO TABS
1.0000 | ORAL_TABLET | Freq: Four times a day (QID) | ORAL | Status: DC | PRN
  Filled 2020-06-15: qty 1

## 2020-06-15 MED ORDER — ENOXAPARIN SODIUM 40 MG/0.4ML ~~LOC~~ SOLN
40.0000 mg | SUBCUTANEOUS | Status: DC
  Administered 2020-06-16 – 2020-06-19 (×4): 40 mg via SUBCUTANEOUS
  Filled 2020-06-15 (×4): qty 0.4

## 2020-06-15 MED ORDER — ALPRAZOLAM 0.5 MG PO TABS
0.5000 mg | ORAL_TABLET | Freq: Three times a day (TID) | ORAL | Status: DC | PRN
  Administered 2020-06-15 – 2020-06-16 (×3): 0.5 mg via ORAL
  Filled 2020-06-15 (×3): qty 1

## 2020-06-15 MED ORDER — OXYCODONE HCL 5 MG PO TABS
5.0000 mg | ORAL_TABLET | Freq: Four times a day (QID) | ORAL | Status: DC | PRN
  Administered 2020-06-15 (×2): 5 mg via ORAL
  Filled 2020-06-15 (×2): qty 1

## 2020-06-15 MED ORDER — DARIFENACIN HYDROBROMIDE ER 15 MG PO TB24
15.0000 mg | ORAL_TABLET | Freq: Every day | ORAL | Status: DC
  Administered 2020-06-17 – 2020-06-20 (×4): 15 mg via ORAL
  Filled 2020-06-15 (×6): qty 1

## 2020-06-15 MED ORDER — DULOXETINE HCL 60 MG PO CPEP
90.0000 mg | ORAL_CAPSULE | Freq: Every day | ORAL | Status: DC
  Administered 2020-06-15 – 2020-06-19 (×5): 90 mg via ORAL
  Filled 2020-06-15 (×3): qty 1
  Filled 2020-06-15: qty 3
  Filled 2020-06-15: qty 1

## 2020-06-15 MED ORDER — DONEPEZIL HCL 10 MG PO TABS
10.0000 mg | ORAL_TABLET | Freq: Every day | ORAL | Status: DC
  Administered 2020-06-15 – 2020-06-19 (×5): 10 mg via ORAL
  Filled 2020-06-15: qty 2
  Filled 2020-06-15 (×4): qty 1

## 2020-06-15 MED ORDER — METOPROLOL SUCCINATE ER 50 MG PO TB24
50.0000 mg | ORAL_TABLET | Freq: Every day | ORAL | Status: DC
  Administered 2020-06-16 – 2020-06-20 (×5): 50 mg via ORAL
  Filled 2020-06-15 (×5): qty 1

## 2020-06-15 MED ORDER — OXYCODONE-ACETAMINOPHEN 5-325 MG PO TABS
1.0000 | ORAL_TABLET | Freq: Four times a day (QID) | ORAL | Status: DC | PRN
  Administered 2020-06-15 (×2): 1 via ORAL
  Filled 2020-06-15 (×2): qty 1

## 2020-06-15 MED ORDER — FLUTICASONE PROPIONATE 50 MCG/ACT NA SUSP
2.0000 | Freq: Every day | NASAL | Status: DC
  Administered 2020-06-17 – 2020-06-20 (×4): 2 via NASAL
  Filled 2020-06-15 (×2): qty 16

## 2020-06-15 MED ORDER — ISOSORBIDE MONONITRATE ER 60 MG PO TB24
60.0000 mg | ORAL_TABLET | Freq: Every day | ORAL | Status: DC
  Administered 2020-06-17 – 2020-06-20 (×4): 60 mg via ORAL
  Filled 2020-06-15 (×6): qty 1

## 2020-06-15 MED ORDER — ONDANSETRON HCL 4 MG PO TABS: 4.0000 mg | ORAL_TABLET | Freq: Four times a day (QID) | ORAL | Status: DC | PRN

## 2020-06-15 NOTE — Progress Notes (Addendum)
   NAME:  Linda [name], MRN:  [id number], DOB:  09/07/1946, LOS: 0 ADMISSION DATE:  06/15/2020, CONSULTATION DATE: 6/23 REFERRING MD:  Dr. Maryan [name], CHIEF COMPLAINT: Choking episode   Brief History   74 y/o F who presented to [facility] ER on 6/23 after a choking episode requiring abdominal thrusts.  Concern for retained chicken nugget.    History of present illness   74 y/o F, assisted living resident, who was eating lunch when she choked on a chicken nugget.  She required abdominal thrusts by staff but continued to have shortness of breath. On arrival to the ER, the patient reportedly had stridor with "gasping respirations". She was placed on a NRB for comfort (pt maintaining saturations).  Initial CXR showed known pleural plaques, no active disease.  ENT saw the patient in evaluation and assessed the upper airway due to upper airway noise which was negative.    PCCM called for evaluation of possible need for bronchoscopy.   Past Medical History  OSA on CPAP  COPD  Asbestos Exposure with Pleural Plaques Pernicious Anemia  PE - dx 2014 RBBB HTN Chronic Diastolic CHF Panic Attacks Neuroleptic induced Parkinsonism Idiopathic Angio-Edema Urticaria  Fibromyalgia  Anxiety [facility] - 2015  Significant Hospital Events   6/23 Admit for obs after chicken nugget choking episode  6/24 Readmit with stridor and chest pain immediatly after discharge  Consults:  PCCM  Procedures:    Significant Diagnostic Tests:  CT Chest w/o Contrast 6/23 >> airways are clear with no findings of significant aspiration.  Asbestos related lung disease.  Micro Data:  COVID 6/23 >> negative  Antimicrobials:     Interim history/subjective:   Complains of chest pressure, throat discomfort, started  Objective   Blood pressure (!) 158/80, pulse 74, temperature 98.2 F (36.8 C), temperature source Oral, resp. rate (!) 22, SpO2 96 %.       No intake or output data in the 24 hours ending 06/15/20 1847 There  were no vitals filed for this visit.  Examination: Gen:      Mild distress HEENT:  EOMI, sclera anicteric Neck:     No masses; no thyromegaly Lungs:    Stridorous breath sounds on inspiration mainly transmitted from neck and upper airway CV:         Regular rate and rhythm; no murmurs Abd:      + bowel sounds; soft, non-tender; no palpable masses, no distension Ext:    No edema; adequate peripheral perfusion Skin:      Warm and dry; no rash Neuro: alert and oriented x 3 Psych: normal mood and affect  Resolved Hospital Problem list     Assessment & Plan:   Choking Episode s/p Abdominal Thrusts Dyspnea  CXR negative for acute process. Upper airway laryngoscopy evaluation per ENT without retained foreign body.   CT chest negative for any aspiration Recurrent stridor is suspicious for vocal cord dysfunction Since she has ongoing symptoms we will attempt bronchoscopy tomorrow Keep n.p.o. after midnight tonight.  I will need to call in the a.m. to see if she can be added onto the schedule. Solumedrol 40 mg IV q6 to reduce airway edema.  Critical care time: n/a   Linda Garfinkel MD [name] Please see [email or web address] for pager details.  06/15/2020, 6:47 PM

## 2020-06-15 NOTE — Consult Note (Deleted)
   NAME:  Linda [name], MRN:  [id number], DOB:  [birth year], LOS: 0 ADMISSION DATE:  06/14/2020, CONSULTATION DATE: 6/23 REFERRING MD:  Dr. Maryan Rued, CHIEF COMPLAINT: Choking episode   Brief History   74 y/o F who presented to [facility] ER on 6/23 after a choking episode requiring abdominal thrusts.  Concern for retained chicken nugget.    History of present illness   74 y/o F, assisted living resident, who was eating lunch when she choked on a chicken nugget.  She required abdominal thrusts by staff but continued to have shortness of breath. On arrival to the ER, the patient reportedly had stridor with "gasping respirations". She was placed on a NRB for comfort (pt maintaining saturations).  Initial CXR showed known pleural plaques, no active disease.  ENT saw the patient in evaluation and assessed the upper airway due to upper airway noise which was negative.    PCCM called for evaluation of possible need for bronchoscopy.   Past Medical History  OSA on CPAP  COPD  Asbestos Exposure with Pleural Plaques Pernicious Anemia  PE - dx 2014 RBBB HTN Chronic Diastolic CHF Panic Attacks Neuroleptic induced Parkinsonism Idiopathic Angio-Edema Urticaria  Fibromyalgia  Anxiety [facility] - 2015  Significant Hospital Events     Consults:    Procedures:    Significant Diagnostic Tests:  CT Chest w/o Contrast 6/23 >> airways are clear with no findings of significant aspiration.  Asbestos related lung disease.  Micro Data:  COVID 6/23 >> negative  Antimicrobials:     Interim history/subjective:   Feels better.  No stridor noted  Objective   Blood pressure (!) 129/93, pulse 75, temperature 97.9 F (36.6 C), temperature source Oral, resp. rate 16, height 5\' 3"  (1.6 m), weight 66.2 kg, SpO2 98 %.       No intake or output data in the 24 hours ending 06/15/20 0817 Filed Weights   06/14/20 1405  Weight: 66.2 kg    Examination: Gen:      No acute distress HEENT:  EOMI, sclera  anicteric Neck:     No masses; no thyromegaly Lungs:    Clear to auscultation bilaterally; normal respiratory effort CV:         Regular rate and rhythm; no murmurs Abd:      + bowel sounds; soft, non-tender; no palpable masses, no distension Ext:    No edema; adequate peripheral perfusion Skin:      Warm and dry; no rash Neuro: alert and oriented x 3 Psych: normal mood and affect  Resolved Hospital Problem list     Assessment & Plan:   Choking Episode s/p Abdominal Thrusts Dyspnea  CXR negative for acute process. Upper airway laryngoscopy evaluation per ENT without retained foreign body.   CT chest negative any aspiration No need for bronchoscopy at this point with improving stridor. Can be discharged from our viewpoint today We will arrange follow-up in pulmonary clinic for evaluation of asbestosis  Critical care time: n/a   Marshell Garfinkel MD [facility] Please see [email or web address] for pager details.  06/15/2020, 8:22 AM

## 2020-06-15 NOTE — Progress Notes (Signed)
Went over discharge instructions w/ patient. She is aware of speech therapist recommendations of no meat, to follow up w/ GI, she is ok to have thin liquids and soft chewable foods.

## 2020-06-15 NOTE — H&P (Signed)
[facility] History and Physical   Patient: Linda [name] [id number]:[id number]   PCP: Tammi Sou, MD DOB: 04-28-1946   DOA: 06/15/2020   DOS: 06/15/2020   DOS: the patient was seen and examined on 06/15/2020  Patient coming from: The patient is coming from Home  Chief Complaint: Choking episode on shraded chicken wrap  HPI: Linda [name] is a 74 y.o. female with Past medical history of dementia, osteoporosis, osteoarthritis, [name], asbestosis, anxiety, fibromyalgia, recent diagnosis of dysphagia and likely vocal cord dysfunction. Patient presented with complaints of choking on the food.  She was recently hospitalized on 06/14/2020 for the same.  Pulmonary was consulted due to presence of the stridor.  Patient was able to swallow safely and started resolved.  Patient was discharged home.  While waiting for the ride back home she was eating on a shredded chicken wrap and started having choking episode which led to severe chest pain for which she took 3 nitroglycerin and came back to the hospital. At the time of my evaluation she denies any complaints of nausea vomiting or chest pain abdominal pain.  No fever no chills.  No diarrhea no constipation. No dizziness or lightheadedness.  No focal deficit.  ED Course: Presents with above complaint.  Pulmonary was consulted.  Patient was scheduled for bronchoscopy tomorrow currently admitted to hospital service.  At her baseline ambulates without assistance independent for most of her ADL;  manages her medication on her own.  Review of Systems: as mentioned in the history of present illness.  All other systems reviewed and are negative.  Past Medical History:  Diagnosis Date  . Acute upper GI bleed 06/2014   while pt taking coumadin, plavix, and meloxicam---despite being told not to take coumadin.  . Anginal pain ([place])    Nonobstructive CAD 2014; however, her cardiologist put her on a statin for this and NOT for hyperlipidemia per pt  report.  Atyp CP 08/2017 at card f/u, plan for myoc perf imaging.  . Anxiety    panic attacks  . Asthma    w/ asbestososis   . BPPV (benign paroxysmal positional vertigo) 12/16/2012  . Chronic diastolic CHF (congestive heart failure) (HCC)    dry wt as of 11/06/16 is 168 lbs.  . Chronic lower back pain   . COPD (chronic obstructive pulmonary disease) ([place])   . DDD (degenerative disc disease)    lumbar and cervical.   . Diverticular disease   . Fibromyalgia    Patient states dx was around her late 32s but she had sx's for years prior to this.  . H/O hiatal hernia   . History of pneumonia    hospitalized 12/2011, 02/2013, and 07/2013 [facility]) for this  . HTN (hypertension)    Renal artery dopplers 04/2013 neg for stenosis.  . Hypervitaminosis D 09/27/2019   over-supplemented.  Stopped vit D and plan recheck 2 mo.  . Idiopathic angio-edema-urticaria [id number]; 2021   Angioedema component was very minimal.  2021->Dr. Bobbitt (allergist) eval.  . Insomnia   . Iron deficiency anemia    Hematologist in [place], [place] did extensive w/u; no cause found; failed oral supplement;; gets fairly regular (q19m or so) IV iron infusions (Venofer -iron sucrose- 200mg  with procrit.  "for 14 yr I've been getting blood work q month & getting infusions prn" (07/12/2013).  Dr. Marin Olp locally, iron infusions done, EPO deficiency dx'd  . Migraine syndrome    "not as often anymore; used to be ~ q wk" (07/12/2013)  .  Mixed incontinence urge and stress   . Nephrolithiasis    "passed all on my own or they are still in there" (07/12/2013)  . Neuroleptic induced Parkinsonism ([place]) 2018   Dr. Carles Collet, neuro, saw her 11/24/17 and recommended d/c of abilify as first step.  D/c'd abilify and pt got complete recovery.  . OSA on CPAP    prior to move to [place]--had another sleep study 10/2015 w/pulm Dr. Camillo Flaming.  . Osteoarthritis    "severe; progressing fast" (07/12/2013); multiple joints-not surgical candidate for TKR (03/2015).  Triamcinolon  knee injections by Dr. Tessa Lerner 12/2017.  [name] Kitchen Pernicious anemia 08/24/2014  . Pleural plaque with presence of asbestos 07/22/2013  . Pulmonary embolism ([place]) 07/2013   Dx at [facility] with very small peripheral upper lobe pe 07/2013: pt took coumadin for about 8-9 mo  . Pyelonephritis    "several times over the last 30 yr" (07/12/2013)  . RBBB (right bundle branch block)   . Recurrent major depression ([place])   . Recurrent UTI    hx of hospitalization for pyelonephritis; started abx prophylaxis 06/2015  . Syncope    Hypotensive; ED visit--Dr. Terrence Dupont did Cath--nonobstructive CAD, EF 55-60%.  In retrospect, suspect pt rx med misuse/polypharmacy   Past Surgical History:  Procedure Laterality Date  . APPENDECTOMY  1960  . AXILLARY SURGERY Left 1978   Multiple "lump" in armpit per pt  . CARDIAC CATHETERIZATION  01/2013   nonobstructive CAD, EF 55-60%  . CARDIOVASCULAR STRESS TEST  02/22/15   Low risk myocard perf imaging; wall motion normal, normal EF  . carotid duplex doppler  10/21/2017   R vertebral flow suggestive of possible distal obstruction.  Pt declined further w/u as of 10/29/17 but need to revisit this problem periodically.  . COCCYX REMOVAL  1972  . DEXA  06/05/2017   T-score -3.1  . DILATION AND CURETTAGE OF UTERUS  ? 1970's  . ESOPHAGOGASTRODUODENOSCOPY N/A 07/19/2014   Gastritis found + in the setting of supratherapeutic INR, +plavix, + meloxicam.  . EYE SURGERY Left 2012-2013   "injections for ~ 1 yr; don't really know what for" (07/12/2013)  . HEEL SPUR SURGERY Left 2008  . KNEE SURGERY  2005  . LEFT HEART CATHETERIZATION WITH CORONARY ANGIOGRAM N/A 01/30/2013   Procedure: LEFT HEART CATHETERIZATION WITH CORONARY ANGIOGRAM;  Surgeon: Clent Demark, MD;  Location: [facility] CATH LAB;  Service: Cardiovascular;  Laterality: N/A;  . PLANTAR FASCIA RELEASE Left 2008  . SPIROMETRY  04/25/14   In hosp for acute asthma/COPD flare: mixed obstructive and restrictive lung disease. The FEV1 is severely  reduced at 45% predicted.  FEV1 signif decreased compared to prior spirometry 07/23/13.  . TENDON RELEASE  1996   Right forearm and hand  . TOTAL ABDOMINAL HYSTERECTOMY  1974  . TRANSTHORACIC ECHOCARDIOGRAM  01/2013; 04/2014;08/2015; 09/2017   2014--NORMAL.  2015--focal basal septal hypertrophy, EF 55-60%, grade I diast dysfxn, mild LAE.  08/2015 EF 55-60%, nl LV syst fxn, grade I DD, valves wnl. 10/21/17: EF 65-70%, grd I DD, o/w normal.   Social History:  reports that she has never smoked. She has never used smokeless tobacco. She reports that she does not drink alcohol and does not use drugs.  Allergies  Allergen Reactions  . Abilify [Aripiprazole] Other (See Comments)    parkinsonism  . Penicillins Itching, Swelling and Rash    Tolerated Cefepime in ED. Has patient had a PCN reaction causing immediate rash, facial/tongue/throat swelling, SOB or lightheadedness with hypotension: Yes Has patient had  a PCN reaction causing severe rash involving mucus membranes or skin necrosis: No Has patient had a PCN reaction that required hospitalization: No  Has patient had a PCN reaction occurring within the last 10 years: No     Family history reviewed and not pertinent Family History  Problem Relation Age of Onset  . Arthritis Mother   . Kidney disease Mother   . Heart disease Father   . Stroke Father   . Hypertension Father   . Diabetes Father   . Heart attack Father   . Heart attack Paternal Grandmother   . Diabetes Sister        one sister  . Hypertension Sister   . Asthma Sister   . Hypertension Brother   . Asthma Brother   . Asthma Daughter   . Multiple sclerosis Son      Prior to Admission medications   Medication Sig Start Date End Date Taking? Authorizing Provider  albuterol (VENTOLIN HFA) 108 (90 Base) MCG/ACT inhaler Inhale 1-2 puffs into the lungs every 6 (six) hours as needed for wheezing or shortness of breath. 12/06/19   McGowen, Adrian Blackwater, MD  alendronate (FOSAMAX) 70 MG  tablet Take 1 tablet (70 mg total) by mouth every 7 (seven) days. Take with a full glass of water on an empty stomach, first thing in the morning and remain up right for 30 minutes after taking. 07/26/19   McGowen, Adrian Blackwater, MD  ALPRAZolam (XANAX) 1 MG tablet TAKE 1 TABLET THREE TIMES DAILY AS NEEDED FOR ANXIETY. Patient taking differently: Take 1 mg by mouth 3 (three) times daily as needed for anxiety or sleep.  05/29/20   McGowen, Adrian Blackwater, MD  aspirin 81 MG tablet Take 81 mg by mouth at bedtime.    [provider]  cyclobenzaprine (FLEXERIL) 5 MG tablet Take 1 tablet (5 mg total) by mouth 3 (three) times daily as needed for muscle spasms. 05/24/20   Meredith Staggers, MD  donepezil (ARICEPT) 10 MG tablet Take 1 tablet (10 mg total) by mouth at bedtime. 01/31/20   McGowen, Adrian Blackwater, MD  DULoxetine (CYMBALTA) 30 MG capsule TAKE 1 CAPSULE ONCE DAILY ALONG WITH 60MG  CAPSULE FOR TOTAL 90MG  DAILY. Patient taking differently: Take by mouth daily. Along with 60 mg capsule for total 90 mg. 04/17/20   McGowen, Adrian Blackwater, MD  DULoxetine (CYMBALTA) 60 MG capsule TAKE 1 CAPSULE A DAY TO BE COMBINED WITH 30MG  CAPSULE Patient taking differently: Take 60 mg by mouth daily. Take with 30mg  to total 90mg  daily. 07/27/19   McGowen, Adrian Blackwater, MD  EPINEPHrine 0.3 mg/0.3 mL IJ SOAJ injection Inject 0.3 mLs (0.3 mg total) into the muscle as needed for anaphylaxis. 01/22/20   Geradine Girt, DO  fluconazole (DIFLUCAN) 100 MG tablet TAKE (1) TABLET DAILY AS NEEDED FOR THRUSH Patient taking differently: Take 100 mg by mouth daily as needed (for thrush).  05/03/20   McGowen, Adrian Blackwater, MD  fluticasone (FLONASE) 50 MCG/ACT nasal spray Place 2 sprays into both nostrils daily. 09/27/19   McGowen, Adrian Blackwater, MD  furosemide (LASIX) 40 MG tablet Take 1 tablet (40 mg total) by mouth daily as needed for fluid. 10/07/19   Cherylann Ratel A, DO  ipratropium (ATROVENT) 0.03 % nasal spray Place 2 sprays into both nostrils every 12 (twelve) hours.  09/27/19   McGowen, Adrian Blackwater, MD  isosorbide mononitrate (IMDUR) 30 MG 24 hr tablet TAKE 2 TABLETS IN THE MORNING AND 1 TABLET IN THE EVENING. Patient  taking differently: Take 30 mg by mouth See admin instructions. Takes 2 tablets by mouth in the morning and 1 tablet in the evening. 05/24/20   Lelon Perla, MD  lamoTRIgine (LAMICTAL) 150 MG tablet TAKE 1 TABLET EACH DAY. Patient taking differently: Take 150 mg by mouth daily.  11/22/19   McGowen, Adrian Blackwater, MD  metoprolol succinate (TOPROL-XL) 50 MG 24 hr tablet TAKE (1) TABLET DAILY IN THE MORNING. Patient taking differently: Take 50 mg by mouth daily.  07/27/19   McGowen, Adrian Blackwater, MD  Multiple Vitamin (MULTIVITAMIN WITH MINERALS) TABS tablet Take 1 tablet by mouth daily.    [provider]  nitroGLYCERIN (NITROSTAT) 0.4 MG SL tablet Place 1 tablet (0.4 mg total) under the tongue every 5 (five) minutes as needed for chest pain (x 3 doses). 09/23/19   Lelon Perla, MD  ondansetron (ZOFRAN) 8 MG tablet Take 1 tablet (8 mg total) by mouth every 8 (eight) hours as needed for nausea or vomiting. 10/22/19   McGowen, Adrian Blackwater, MD  oxyCODONE-acetaminophen (PERCOCET) 10-325 MG tablet Every 6 hours as needed Patient taking differently: Take 1 tablet by mouth every 6 (six) hours as needed for pain. Every 6 hours as needed 05/24/20   Meredith Staggers, MD  pantoprazole (PROTONIX) 40 MG tablet TAKE 1 TABLET BY MOUTH DAILY. Patient taking differently: Take 40 mg by mouth daily.  12/10/19   McGowen, Adrian Blackwater, MD  rosuvastatin (CRESTOR) 20 MG tablet Take 1 tablet (20 mg total) by mouth daily. 09/23/19 02/28/28  Lelon Perla, MD  solifenacin (VESICARE) 10 MG tablet Take 1 tablet (10 mg total) by mouth daily. 04/28/20   McGowen, Adrian Blackwater, MD  traZODone (DESYREL) 50 MG tablet TAKE 2-4 TABLETS AT BEDTIME Patient taking differently: Take 150 mg by mouth at bedtime.  12/10/19   Tammi Sou, MD    Physical Exam: Vitals:   06/15/20 1915 06/15/20 2004  06/15/20 2053 06/15/20 2100  BP: (!) 185/109 (!) 203/90  (!) 167/78  Pulse: 80 73  89  Resp: 18 20  18   Temp:      TempSrc:      SpO2: 99% 100% 100% 99%    General: alert and oriented to time, place, and person. Appear in mild distress, affect appropriate Eyes: PERRL, Conjunctiva normal ENT: Oral Mucosa Clear, moist  Neck: no JVD, no Abnormal Mass Or lumps occasional stridor heard. Cardiovascular: S1 and S2 Present, no Murmur, peripheral pulses symmetrical Respiratory: good respiratory effort, Bilateral Air entry equal and Decreased, no signs of accessory muscle use, no Crackles, bilateral  wheezes Abdomen: Bowel Sound present, Soft and no tenderness, no hernia Skin: no rashes  Extremities: no Pedal edema, no calf tenderness Neurologic: without any new focal findings Gait not checked due to patient safety concerns  Data Reviewed: I have personally reviewed and interpreted labs, imaging as discussed below.  CBC: Recent Labs  Lab 06/14/20 1450 06/15/20 0327 06/15/20 1710  WBC 8.0 7.9 13.8*  NEUTROABS 4.4  --   --   HGB 13.5 13.6 14.6  HCT 41.4 42.6 45.7  MCV 91.2 91.4 89.8  PLT 188 191 256   Basic Metabolic Panel: Recent Labs  Lab 06/14/20 1450 06/15/20 0327 06/15/20 1710  NA 138 137 135  K 4.1 4.3 4.1  CL 104 103 101  CO2 25 24 27   GLUCOSE 106* 132* 108*  BUN 20 17 20   CREATININE 0.85 0.75 0.82  CALCIUM 8.6* 8.7* 8.8*   GFR: Estimated Creatinine  Clearance: 55 mL/min (by C-G formula based on SCr of 0.82 mg/dL). Liver Function Tests: No results for input(s): AST, ALT, ALKPHOS, BILITOT, PROT, ALBUMIN in the last 168 hours. No results for input(s): LIPASE, AMYLASE in the last 168 hours. No results for input(s): AMMONIA in the last 168 hours. Coagulation Profile: No results for input(s): INR, PROTIME in the last 168 hours. Cardiac Enzymes: No results for input(s): CKTOTAL, CKMB, CKMBINDEX, TROPONINI in the last 168 hours. BNP (last 3 results) No results for  input(s): PROBNP in the last 8760 hours. HbA1C: No results for input(s): HGBA1C in the last 72 hours. CBG: No results for input(s): GLUCAP in the last 168 hours. Lipid Profile: No results for input(s): CHOL, HDL, LDLCALC, TRIG, CHOLHDL, LDLDIRECT in the last 72 hours. Thyroid Function Tests: No results for input(s): TSH, T4TOTAL, FREET4, T3FREE, THYROIDAB in the last 72 hours. Anemia Panel: No results for input(s): VITAMINB12, FOLATE, FERRITIN, TIBC, IRON, RETICCTPCT in the last 72 hours. Urine analysis:    Component Value Date/Time   COLORURINE YELLOW 10/04/2019 [place] 10/04/2019 1305   LABSPEC 1.004 (L) 10/04/2019 1305   PHURINE 6.0 10/04/2019 1305   GLUCOSEU NEGATIVE 10/04/2019 1305   HGBUR NEGATIVE 10/04/2019 [place] 10/04/2019 1305   BILIRUBINUR Negative 08/07/2018 [place] 10/04/2019 1305   PROTEINUR NEGATIVE 10/04/2019 1305   UROBILINOGEN 0.2 08/07/2018 1437   UROBILINOGEN 1.0 10/16/2015 2009   NITRITE POSITIVE (A) 10/04/2019 1305   LEUKOCYTESUR NEGATIVE 10/04/2019 1305    Radiological Exams on Admission: DG Neck Soft Tissue  Result Date: 06/14/2020 CLINICAL DATA:  Choking episode. EXAM: NECK SOFT TISSUES - 1+ VIEW COMPARISON:  Chest x-ray dated December 06, 2019. CT cervical spine dated October 21, 2017. FINDINGS: There is no evidence of retropharyngeal soft tissue swelling or epiglottic enlargement. The cervical airway is unremarkable and no radio-opaque foreign body identified. Bilateral carotid artery calcific atherosclerosis. Bilateral calcified pleural plaques in the lungs again noted. IMPRESSION: 1. Negative.  No radiopaque foreign body identified. Electronically Signed   By: Titus Dubin M.D.   On: 06/14/2020 14:39   DG Chest 2 View  Result Date: 06/15/2020 CLINICAL DATA:  Chest pain. EXAM: CHEST - 2 VIEW COMPARISON:  CT chest and chest x-ray from yesterday. FINDINGS: The heart size and mediastinal contours  are within normal limits. Normal pulmonary vascularity. Bilateral calcified pleural plaques again noted. No focal consolidation, pleural effusion, or pneumothorax. Multiple thoracic compression deformities again noted. Oral contrast within the visualized bowel. IMPRESSION: 1. No acute cardiopulmonary disease. Electronically Signed   By: Titus Dubin M.D.   On: 06/15/2020 17:35   CT CHEST WO CONTRAST  Result Date: 06/14/2020 CLINICAL DATA:  74 year old female with history of aspiration. EXAM: CT CHEST WITHOUT CONTRAST TECHNIQUE: Multidetector CT imaging of the chest was performed following the standard protocol without IV contrast. COMPARISON:  Chest CT 01/01/2016. FINDINGS: Cardiovascular: Heart size is normal. There is no significant pericardial fluid, thickening or pericardial calcification. There is aortic atherosclerosis, as well as atherosclerosis of the great vessels of the mediastinum and the coronary arteries, including calcified atherosclerotic plaque in the left main, left anterior descending, left circumflex and right coronary arteries. Mediastinum/Nodes: No pathologically enlarged mediastinal or hilar lymph nodes. Please note that accurate exclusion of hilar adenopathy is limited on noncontrast CT scans. Esophagus is unremarkable in appearance. No axillary lymphadenopathy. Lungs/Pleura: Numerous calcified pleural plaques bilaterally, indicative of asbestos related pleural disease. No pleural effusions. No suspicious appearing pulmonary  nodules or masses are noted. No acute consolidative airspace disease. Some peripheral predominant areas of ground-glass attenuation and parenchymal banding throughout the mid to lower lungs bilaterally. No honeycombing. Upper Abdomen: Aortic atherosclerosis. Musculoskeletal: New compression fractures of T7 and T9, most severe at T9 with 40% loss of anterior vertebral body height. There are no aggressive appearing lytic or blastic lesions noted in the visualized  portions of the skeleton. IMPRESSION: 1. No imaging findings to suggest significant aspiration. 2. Asbestos related pleural disease with imaging findings in the lung bases bilaterally concerning for developing interstitial lung disease such as asbestosis. Outpatient referral to Pulmonology for appropriate follow-up is recommended. 3. Aortic atherosclerosis, in addition to left main and 3 vessel coronary artery disease. Assessment for potential risk factor modification, dietary therapy or pharmacologic therapy may be warranted, if clinically indicated. 4. New compression fractures of T7 and T9, most severe at T9 where there is 40% loss of anterior vertebral body height. Aortic Atherosclerosis (ICD10-I70.0). Electronically Signed   By: Vinnie Langton M.D.   On: 06/14/2020 16:17   DG Chest Portable 1 View  Result Date: 06/14/2020 CLINICAL DATA:  Choking episode today. EXAM: PORTABLE CHEST 1 VIEW COMPARISON:  Single-view of the chest 12/06/2019. FINDINGS: Calcified pleural plaques are again seen. Lungs are clear. Heart size is enlarged. Aortic atherosclerosis. No pneumothorax or pleural fluid. IMPRESSION: No acute disease. Calcified pleural plaques consistent with prior asbestos exposure. Cardiomegaly. Aortic Atherosclerosis (ICD10-I70.0). Electronically Signed   By: Inge Rise M.D.   On: 06/14/2020 14:37   EKG: Independently reviewed. normal sinus rhythm, nonspecific ST and T waves changes.  I reviewed all nursing notes, pharmacy notes, vitals, pertinent old records.  Assessment/Plan 1. [facility] consulted. Appreciate assistance for Schedule for bronchoscopy tomorrow. Racemic epi as needed.  2.  COPD with mild exacerbation Continue IV steroids. Monitor.  3.  Dysphagia. Per speech therapy evaluation MBS patient will be okay for dysphagia 3 diet although it appears that the patient is at risk for food lodging in the cervical esophagus. Recommended to avoid meat. Currently we will  give her dysphagia 1 diet thin liquids. Speech therapy consulted. Speech also recommends considering GI evaluation will defer to primary team in the morning to make a decision versus esophagogram after discussion with the speech.  4.  Fibromyalgia. Chronic anxiety. Chronic pain syndrome. Continue home regimen. Patient was informed that this medications can increase her risk for aspiration.  5.  Chronic diastolic CHF. Currently not volume overloaded. Continue home medication.  6.  Goals of care discussion. Patient would like to be full code for now protection patient had prior different code request. Also reportedly her living will suggest DNR.   Nutrition: Dysphagia type 1 thin Liquid DVT Prophylaxis: Subcutaneous Heparin   Advance goals of care discussion: Full code   Consults: EDP discussed with pulmonary.  Family Communication: family was present at bedside, at the time of interview.  Opportunity was given to ask question and all questions were answered satisfactorily.   Disposition:  Status is: Inpatient  Remains inpatient appropriate because:Ongoing diagnostic testing needed not appropriate for outpatient work up   Dispo: The patient is from: Home              Anticipated d/c is to: Home              Anticipated d/c date is: 2 days              Patient currently is not medically stable to d/c.  Author: Berle Mull, MD [facility] 06/15/2020 9:56 PM   To reach On-call, see care teams to locate the attending and reach out to them via [email or web address]. If 7PM-7AM, please contact night-coverage If you still have difficulty reaching the attending provider, please page the [facility] (Director on Call) for [facility] on amion for assistance.

## 2020-06-15 NOTE — Progress Notes (Signed)
Modified Barium Swallow Progress Note  Patient Details  Name: Linda [name] MRN: [id number] Date of Birth: 15-Apr-1946  Today's Date: 06/15/2020  Modified Barium Swallow completed.  Full report located under Chart Review in the Imaging Section.  Brief recommendations include the following:  Clinical Impression  Pt demonstrates a mild cervical esophageal dysphagia with appearance of decreased relaxation of cricopharyngeus muscle. Pt had one instance of traace frank flash penetration when liquid hit closed UES and was propelled into pharynx. Pt had trace residue post swallwo that she sensed and cleared with a second swallow. In general her behavior and function was much more controlled during exam than in the room. Esophageal sweep revealed open esophageal colum and distal residue, no radiologist present to confirm. Pt may be at ongoing risk of food lodging in the cervical esophagus. Recommend pt avoid meats for a time. She is otherwise safe to continue with thin liquids and soft chewable solids. Discussed with pt. May need to f/u with GI.    Swallow Evaluation Recommendations   Recommended Consults: Consider GI evaluation   SLP Diet Recommendations: Thin liquid;Dysphagia 3 (Mech soft) solids   Liquid Administration via: Cup;Straw   Medication Administration: Crushed with puree   Supervision: Patient able to self feed   Compensations: Slow rate;Small sips/bites   Postural Changes: Remain semi-upright after after feeds/meals (Comment);Seated upright at 90 degrees   Oral Care Recommendations: Oral care BID       Herbie [place], MA [place] Pager [phone] Office [phone]  Lynann Beaver 06/15/2020,1:48 PM

## 2020-06-15 NOTE — Progress Notes (Signed)
Pt arrived to room 1539 approx 2300. Voice is a soft whisper with occasional stridorous inspiration. States throat does hurt. Did well with swallowing multiple pill whole with water and 2 applesauce containers

## 2020-06-15 NOTE — Progress Notes (Signed)
   NAME:  Linda [name], MRN:  [id number], DOB:  08-01-1946, LOS: 0 ADMISSION DATE:  06/14/2020, CONSULTATION DATE: 6/23 REFERRING MD:  Dr. Maryan Rued, CHIEF COMPLAINT: Choking episode   Brief History   74 y/o F who presented to [facility] ER on 6/23 after a choking episode requiring abdominal thrusts.  Concern for retained chicken nugget.    History of present illness   74 y/o F, assisted living resident, who was eating lunch when she choked on a chicken nugget.  She required abdominal thrusts by staff but continued to have shortness of breath. On arrival to the ER, the patient reportedly had stridor with "gasping respirations". She was placed on a NRB for comfort (pt maintaining saturations).  Initial CXR showed known pleural plaques, no active disease.  ENT saw the patient in evaluation and assessed the upper airway due to upper airway noise which was negative.    PCCM called for evaluation of possible need for bronchoscopy.   Past Medical History  OSA on CPAP  COPD  Asbestos Exposure with Pleural Plaques Pernicious Anemia  PE - dx 2014 RBBB HTN Chronic Diastolic CHF Panic Attacks Neuroleptic induced Parkinsonism Idiopathic Angio-Edema Urticaria  Fibromyalgia  Anxiety UGIB - 2015  Significant Hospital Events     Consults:    Procedures:    Significant Diagnostic Tests:  CT Chest w/o Contrast 6/23 >> airways are clear with no findings of significant aspiration.  Asbestos related lung disease.  Micro Data:  COVID 6/23 >> negative  Antimicrobials:     Interim history/subjective:   Feels better.  No stridor noted  Objective   Blood pressure (!) 129/93, pulse 75, temperature 97.9 F (36.6 C), temperature source Oral, resp. rate 16, height 5\' 3"  (1.6 m), weight 66.2 kg, SpO2 98 %.       No intake or output data in the 24 hours ending 06/15/20 0824 Filed Weights   06/14/20 1405  Weight: 66.2 kg    Examination: Gen:      No acute distress HEENT:  EOMI, sclera  anicteric Neck:     No masses; no thyromegaly Lungs:    Clear to auscultation bilaterally; normal respiratory effort CV:         Regular rate and rhythm; no murmurs Abd:      + bowel sounds; soft, non-tender; no palpable masses, no distension Ext:    No edema; adequate peripheral perfusion Skin:      Warm and dry; no rash Neuro: alert and oriented x 3 Psych: normal mood and affect  Resolved Hospital Problem list     Assessment & Plan:   Choking Episode s/p Abdominal Thrusts Dyspnea  CXR negative for acute process. Upper airway laryngoscopy evaluation per ENT without retained foreign body.   CT chest negative any aspiration No need for bronchoscopy at this point with improving stridor. Can be discharged from our viewpoint today We will arrange follow-up in pulmonary clinic for evaluation of asbestosis  Critical care time: n/a   Marshell Garfinkel MD [facility] Please see [email or web address] for pager details.  06/15/2020, 8:24 AM

## 2020-06-15 NOTE — Evaluation (Signed)
Clinical/Bedside Swallow Evaluation Patient Details  Name: Linda [name] MRN: [id number] Date of Birth: [birth year]  Today's Date: 06/15/2020 Time: SLP Start Time (ACUTE ONLY): 1045 SLP Stop Time (ACUTE ONLY): 1115 SLP Time Calculation (min) (ACUTE ONLY): 30 min  Past Medical History:  Past Medical History:  Diagnosis Date  . Acute upper GI bleed 06/2014   while pt taking coumadin, plavix, and meloxicam---despite being told not to take coumadin.  . Anginal pain ([place])    Nonobstructive CAD 2014; however, her cardiologist put her on a statin for this and NOT for hyperlipidemia per pt report.  Atyp CP 08/2017 at card f/u, plan for myoc perf imaging.  . Anxiety    panic attacks  . Asthma    w/ asbestososis   . BPPV (benign paroxysmal positional vertigo) 12/16/2012  . Chronic diastolic CHF (congestive heart failure) (HCC)    dry wt as of 11/06/16 is 168 lbs.  . Chronic lower back pain   . COPD (chronic obstructive pulmonary disease) ([place])   . DDD (degenerative disc disease)    lumbar and cervical.   . Diverticular disease   . Fibromyalgia    Patient states dx was around her late 28s but she had sx's for years prior to this.  . H/O hiatal hernia   . History of pneumonia    hospitalized 12/2011, 02/2013, and 07/2013 [facility]) for this  . HTN (hypertension)    Renal artery dopplers 04/2013 neg for stenosis.  . Hypervitaminosis D 09/27/2019   over-supplemented.  Stopped vit D and plan recheck 2 mo.  . Idiopathic angio-edema-urticaria [id number]; 2021   Angioedema component was very minimal.  2021->Dr. Bobbitt (allergist) eval.  . Insomnia   . Iron deficiency anemia    Hematologist in [place], [place] did extensive w/u; no cause found; failed oral supplement;; gets fairly regular (q75m or so) IV iron infusions (Venofer -iron sucrose- 200mg  with procrit.  "for 14 yr I've been getting blood work q month & getting infusions prn" (07/12/2013).  Dr. Marin Olp locally, iron infusions done, EPO deficiency dx'd   . Migraine syndrome    "not as often anymore; used to be ~ q wk" (07/12/2013)  . Mixed incontinence urge and stress   . Nephrolithiasis    "passed all on my own or they are still in there" (07/12/2013)  . Neuroleptic induced Parkinsonism ([place]) 2018   Dr. Carles Collet, neuro, saw her 11/24/17 and recommended d/c of abilify as first step.  D/c'd abilify and pt got complete recovery.  . OSA on CPAP    prior to move to --had another sleep study 10/2015 w/pulm Dr. Camillo Flaming.  . Osteoarthritis    "severe; progressing fast" (07/12/2013); multiple joints-not surgical candidate for TKR (03/2015).  Triamcinolon knee injections by Dr. Tessa Lerner 12/2017.  [name] Kitchen Pernicious anemia 08/24/2014  . Pleural plaque with presence of asbestos 07/22/2013  . Pulmonary embolism ([place]) 07/2013   Dx at [facility] with very small peripheral upper lobe pe 07/2013: pt took coumadin for about 8-9 mo  . Pyelonephritis    "several times over the last 30 yr" (07/12/2013)  . RBBB (right bundle branch block)   . Recurrent major depression ([place])   . Recurrent UTI    hx of hospitalization for pyelonephritis; started abx prophylaxis 06/2015  . Syncope    Hypotensive; ED visit--Dr. Terrence Dupont did Cath--nonobstructive CAD, EF 55-60%.  In retrospect, suspect pt rx med misuse/polypharmacy   Past Surgical History:  Past Surgical History:  Procedure Laterality Date  . APPENDECTOMY  1960  .  AXILLARY SURGERY Left 1978   Multiple "lump" in armpit per pt  . CARDIAC CATHETERIZATION  01/2013   nonobstructive CAD, EF 55-60%  . CARDIOVASCULAR STRESS TEST  02/22/15   Low risk myocard perf imaging; wall motion normal, normal EF  . carotid duplex doppler  10/21/2017   R vertebral flow suggestive of possible distal obstruction.  Pt declined further w/u as of 10/29/17 but need to revisit this problem periodically.  . COCCYX REMOVAL  1972  . DEXA  06/05/2017   T-score -3.1  . DILATION AND CURETTAGE OF UTERUS  ? 1970's  . ESOPHAGOGASTRODUODENOSCOPY N/A 07/19/2014   Gastritis  found + in the setting of supratherapeutic INR, +plavix, + meloxicam.  . EYE SURGERY Left 2012-2013   "injections for ~ 1 yr; don't really know what for" (07/12/2013)  . HEEL SPUR SURGERY Left 2008  . KNEE SURGERY  2005  . LEFT HEART CATHETERIZATION WITH CORONARY ANGIOGRAM N/A 01/30/2013   Procedure: LEFT HEART CATHETERIZATION WITH CORONARY ANGIOGRAM;  Surgeon: Clent Demark, MD;  Location: [facility] CATH LAB;  Service: Cardiovascular;  Laterality: N/A;  . PLANTAR FASCIA RELEASE Left 2008  . SPIROMETRY  04/25/14   In hosp for acute asthma/COPD flare: mixed obstructive and restrictive lung disease. The FEV1 is severely reduced at 45% predicted.  FEV1 signif decreased compared to prior spirometry 07/23/13.  . TENDON RELEASE  1996   Right forearm and hand  . TOTAL ABDOMINAL HYSTERECTOMY  1974  . TRANSTHORACIC ECHOCARDIOGRAM  01/2013; 04/2014;08/2015; 09/2017   2014--NORMAL.  2015--focal basal septal hypertrophy, EF 55-60%, grade I diast dysfxn, mild LAE.  08/2015 EF 55-60%, nl LV syst fxn, grade I DD, valves wnl. 10/21/17: EF 65-70%, grd I DD, o/w normal.   HPI:  74 y/o F who presented to [facility] ER on 6/23 after a choking episode requiring abdominal thrusts.  Concern for retained chicken nugget.  ENT finding negative for Upper airway obstruction, pulmonology reports pts stridor has resolved and bronch not needed.  medical history significant of idiopathic angioedema, nonobstructive CAD, chronic diastolic congestive heart failure, COPD on 3 L home oxygen, PE, neuroleptic induced parkinsonism, hypertension, fibromyalgia, anxiety, asthma. Was seen early in 2021 for idiopathin angioedema of lips tongue causing some acute dysphagia which resolved quickly. Pt able to consume regular solids by the time of d/c. Upper endoscopy in 2015 showed some gastritis, but no stricture.    Assessment / Plan / Recommendation Clinical Impression  Pt reports a history of feeling food lodge (points to sternum). Her account of events  yesterday are conflicting. Pt reports the food being lodged for a prolonged period, but denies being able to breathe or speak, which seem impossible. She reports her breathing slightly improved after sweeping her finger in the back of her mouth, but not fully. Today under observation pt continues to by mildly dysphonic with an occasional irregular breathing pattern. When taking bites of sips, she has a short quick inspiration that does seem to cause poor coordination of swallow with risk of choking. She also struggles with posterior transit and flings her head back. All of these behaviors seem somewhat unnecessary and also contribute to risk. Need instrumental testing to determine if pt can swallow safely.  SLP Visit Diagnosis: Dysphagia, oropharyngeal phase (R13.12)    Aspiration Risk  Mild aspiration risk    Diet Recommendation Thin liquid;Dysphagia 1 (Puree)   Liquid Administration via: Cup;Straw Medication Administration: Crushed with puree Supervision: Patient able to self feed Compensations: Slow rate;Small sips/bites Postural Changes: Seated upright  at 90 degrees    Other  Recommendations     Follow up Recommendations 24 hour supervision/assistance      Frequency and Duration            Prognosis        Swallow Study   General HPI: 74 y/o F who presented to [facility] ER on 6/23 after a choking episode requiring abdominal thrusts.  Concern for retained chicken nugget.  ENT finding negative for Upper airway obstruction, pulmonology reports pts stridor has resolved and bronch not needed.  medical history significant of idiopathic angioedema, nonobstructive CAD, chronic diastolic congestive heart failure, COPD on 3 L home oxygen, PE, neuroleptic induced parkinsonism, hypertension, fibromyalgia, anxiety, asthma. Was seen early in 2021 for idiopathin angioedema of lips tongue causing some acute dysphagia which resolved quickly. Pt able to consume regular solids by the time of d/c. Upper  endoscopy in 2015 showed some gastritis, but no stricture.  Type of Study: Bedside Swallow Evaluation Previous Swallow Assessment: none Diet Prior to this Study: Regular;Thin liquids Temperature Spikes Noted: No Respiratory Status: Nasal cannula History of Recent Intubation: No Behavior/Cognition: Alert;Cooperative;Pleasant mood Oral Cavity Assessment: Within Functional Limits Oral Care Completed by SLP: No Oral Cavity - Dentition: Adequate natural dentition Vision: Functional for self-feeding Self-Feeding Abilities: Able to feed self Patient Positioning: Upright in bed Baseline Vocal Quality: Hoarse Volitional Cough: Weak Volitional Swallow: Able to elicit    Oral/Motor/Sensory Function     Ice Chips     Thin Liquid Thin Liquid: Impaired Presentation: Straw;Cup Oral Phase Functional Implications: Prolonged oral transit Pharyngeal  Phase Impairments: Multiple swallows    Nectar Thick Nectar Thick Liquid: Not tested   Honey Thick Honey Thick Liquid: Not tested   Puree Puree: Impaired Presentation: Spoon;Self Fed Oral Phase Functional Implications: Prolonged oral transit   Solid     Solid: Impaired Presentation: Self Fed Oral Phase Functional Implications: Prolonged oral transit Pharyngeal Phase Impairments:  (gagging)     Herbie Baltimore, MA CCC-SLP  Acute Rehabilitation Services Pager [phone] Office [phone]  Lynann Beaver 06/15/2020,12:22 PM

## 2020-06-15 NOTE — ED Triage Notes (Signed)
Pt was discharged from upstairs while ago. While waiting to leave started having chest pains. Report took 3 Nitro and family brought to ED. Reports starting to have relief now. Pt has esophageal obstruction and has irregular breathing that was same when was seen here yesterday for food obstruction. Pt has to follow up with Gi.

## 2020-06-15 NOTE — Discharge Summary (Addendum)
[facility]  Physician Discharge Summary   Patient ID: Linda [name] MRN: [id number] DOB/AGE: [birth year] 74 y.o.  Admit date: 06/14/2020 Discharge date: 06/15/2020  PCP: Tammi Sou, MD  DISCHARGE DIAGNOSES:  Choking episode requiring Heimlich maneuver History of osteoarthritis/degenerative disc disease Osteoporosis Vertebral compression fractures History of anxiety History of asbestosis Essential hypertension Hyperlipidemia Urge incontinence  RECOMMENDATIONS FOR OUTPATIENT FOLLOW UP: 1. Pulmonology will arrange outpatient follow-up for asbestosis 2. Seen by speech therapy.  Referral to GI is recommended.  Please address at follow-up.    Home Health: None Equipment/Devices: Continue using oxygen  CODE STATUS: Full code  DISCHARGE CONDITION: fair  Diet recommendation: Soft diet.  Patient to be seen by speech therapy prior to discharge who may change the dietary recommendations.    INITIAL HISTORY: 74 y.o. female with a past medical history of dementia, osteoporosis, osteoarthritis, degenerative disc disease, spinal stenosis, history of asbestosis, history of anxiety who lives in an independent living facility who was in her usual state of health till earlier today when she was eating her food and she started choking on the chicken.  Somebody at the facility had to perform Heimlich maneuver.  Some of the chicken was expelled.  Patient was subsequently noted to have some difficulty breathing with stridor.  EMS was called.  Patient was given oxygen and brought into the emergency department.  Patient mentions that she is feeling slightly better now.  Occasionally she has some difficulty breathing.  Her son is at the bedside.  She denies any chest pain.  She does have some pain in her upper abdomen where they performed the Heimlich maneuver.  Denies any nausea vomiting.  This has not happened previously.  Denies any headaches.  No passing out episodes.  In the  emergency department she was seen by ENT due to stridor.  No foreign object was found on examination.  She was also seen by pulmonology.  She underwent a CT scan.  She will need to be observed overnight in the hospital.  She has been weaned off of oxygen.  Consultations:  Pulmonology  ENT, Dr. Benjamine Mola  Procedures:  Direct laryngoscopy   HOSPITAL COURSE:   Choking episode due to food She choked on chicken.  Heimlich maneuver was performed at her independent living facility.  Patient was seen by ENT and underwent direct laryngoscopy which did not show any foreign body.  Seen by pulmonary medicine as well.  Patient was observed in the hospital.  She underwent a CT scan of her chest as well.  She was noted to have stridor initially which appears to have resolved.  She feels better.  She uses oxygen at home.  She may continue to do so.  Pulmonology will arrange follow-up with them for the asbestosis.  Since patient has improved she does not need to undergo bronchoscopy.  She will be seen by speech therapy prior to discharge.  Soft diet.  History of osteoarthritis/degenerative disc disease/osteoporosis/vertebral compression fractures She is usually independent with ambulation.  Once in a while she uses her cane or walker.  Continue with her home medications.  New T7 and T9 compression fractures noted on CT scan.  She does not have any acute pain at this time. Mobilize as tolerated.  Continue bisphosphonates.  History of anxiety Continue with Cymbalta and alprazolam as needed. Also noted to be on lamictal.  History of asbestosis Pleural disease noted on CT scan.    Pulmonology will arrange outpatient follow-up  History of essential hypertension  Continue with metoprolol.  She is also on Imdur.  History of hyperlipidemia On Crestor.  History of urge incontinence/overactive bladder Continue with Vesicare.  Overall stable.  Okay for discharge after seen by speech therapy.   PERTINENT  LABS:  The results of significant diagnostics from this hospitalization (including imaging, microbiology, ancillary and laboratory) are listed below for reference.    Microbiology: Recent Results (from the past 240 hour(s))  SARS Coronavirus 2 by RT PCR (hospital order, performed in [facility] hospital lab) Nasopharyngeal Nasopharyngeal Swab     Status: None   Collection Time: 06/14/20  2:03 PM   Specimen: Nasopharyngeal Swab  Result Value Ref Range Status   SARS Coronavirus 2 NEGATIVE NEGATIVE Final    Comment: (NOTE) SARS-CoV-2 target nucleic acids are NOT DETECTED.  The SARS-CoV-2 RNA is generally detectable in upper and lower respiratory specimens during the acute phase of infection. The lowest concentration of SARS-CoV-2 viral copies this assay can detect is 250 copies / mL. A negative result does not preclude SARS-CoV-2 infection and should not be used as the sole basis for treatment or other patient management decisions.  A negative result may occur with improper specimen collection / handling, submission of specimen other than nasopharyngeal swab, presence of viral mutation(s) within the areas targeted by this assay, and inadequate number of viral copies (<250 copies / mL). A negative result must be combined with clinical observations, patient history, and epidemiological information.  Fact Sheet for Patients:   StrictlyIdeas.no  Fact Sheet for Healthcare Providers: BankingDealers.co.za  This test is not yet approved or  cleared by the [place] FDA and has been authorized for detection and/or diagnosis of SARS-CoV-2 by FDA under an Emergency Use Authorization (EUA).  This EUA will remain in effect (meaning this test can be used) for the duration of the COVID-19 declaration under Section 564(b)(1) of the Act, 21 U.S.C. section 360bbb-3(b)(1), unless the authorization is terminated or revoked sooner.  Performed at  [facility], [place] [address], [place], [place] [address]      Labs:    Basic Metabolic Panel: Recent Labs  Lab 06/14/20 1450 06/15/20 0327  NA 138 137  K 4.1 4.3  CL 104 103  CO2 25 24  GLUCOSE 106* 132*  BUN 20 17  CREATININE 0.85 0.75  CALCIUM 8.6* 8.7*   CBC: Recent Labs  Lab 06/14/20 1450 06/15/20 0327  WBC 8.0 7.9  NEUTROABS 4.4  --   HGB 13.5 13.6  HCT 41.4 42.6  MCV 91.2 91.4  PLT 188 191     IMAGING STUDIES DG Neck Soft Tissue  Result Date: 06/14/2020 CLINICAL DATA:  Choking episode. EXAM: NECK SOFT TISSUES - 1+ VIEW COMPARISON:  Chest x-ray dated December 06, 2019. CT cervical spine dated October 21, 2017. FINDINGS: There is no evidence of retropharyngeal soft tissue swelling or epiglottic enlargement. The cervical airway is unremarkable and no radio-opaque foreign body identified. Bilateral carotid artery calcific atherosclerosis. Bilateral calcified pleural plaques in the lungs again noted. IMPRESSION: 1. Negative.  No radiopaque foreign body identified. Electronically Signed   By: Titus Dubin M.D.   On: 06/14/2020 14:39   CT CHEST WO CONTRAST  Result Date: 06/14/2020 CLINICAL DATA:  74 year old female with history of aspiration. EXAM: CT CHEST WITHOUT CONTRAST TECHNIQUE: Multidetector CT imaging of the chest was performed following the standard protocol without IV contrast. COMPARISON:  Chest CT 01/01/2016. FINDINGS: Cardiovascular: Heart size is normal. There is no significant pericardial fluid, thickening or pericardial  calcification. There is aortic atherosclerosis, as well as atherosclerosis of the great vessels of the mediastinum and the coronary arteries, including calcified atherosclerotic plaque in the left main, left anterior descending, left circumflex and right coronary arteries. Mediastinum/Nodes: No pathologically enlarged mediastinal or hilar lymph nodes. Please note that accurate exclusion of hilar adenopathy is limited on  noncontrast CT scans. Esophagus is unremarkable in appearance. No axillary lymphadenopathy. Lungs/Pleura: Numerous calcified pleural plaques bilaterally, indicative of asbestos related pleural disease. No pleural effusions. No suspicious appearing pulmonary nodules or masses are noted. No acute consolidative airspace disease. Some peripheral predominant areas of ground-glass attenuation and parenchymal banding throughout the mid to lower lungs bilaterally. No honeycombing. Upper Abdomen: Aortic atherosclerosis. Musculoskeletal: New compression fractures of T7 and T9, most severe at T9 with 40% loss of anterior vertebral body height. There are no aggressive appearing lytic or blastic lesions noted in the visualized portions of the skeleton. IMPRESSION: 1. No imaging findings to suggest significant aspiration. 2. Asbestos related pleural disease with imaging findings in the lung bases bilaterally concerning for developing interstitial lung disease such as asbestosis. Outpatient referral to Pulmonology for appropriate follow-up is recommended. 3. Aortic atherosclerosis, in addition to left main and 3 vessel coronary artery disease. Assessment for potential risk factor modification, dietary therapy or pharmacologic therapy may be warranted, if clinically indicated. 4. New compression fractures of T7 and T9, most severe at T9 where there is 40% loss of anterior vertebral body height. Aortic Atherosclerosis (ICD10-I70.0). Electronically Signed   By: Vinnie Langton M.D.   On: 06/14/2020 16:17   DG Chest Portable 1 View  Result Date: 06/14/2020 CLINICAL DATA:  Choking episode today. EXAM: PORTABLE CHEST 1 VIEW COMPARISON:  Single-view of the chest 12/06/2019. FINDINGS: Calcified pleural plaques are again seen. Lungs are clear. Heart size is enlarged. Aortic atherosclerosis. No pneumothorax or pleural fluid. IMPRESSION: No acute disease. Calcified pleural plaques consistent with prior asbestos exposure. Cardiomegaly.  Aortic Atherosclerosis (ICD10-I70.0). Electronically Signed   By: Inge Rise M.D.   On: 06/14/2020 14:37    DISCHARGE EXAMINATION: Vitals:   06/15/20 0256 06/15/20 0334 06/15/20 0727 06/15/20 0954  BP: 127/65  (!) 129/93 139/79  Pulse: 82  75 75  Resp: 16  16   Temp: (!) 97.5 F (36.4 C)  97.9 F (36.6 C)   TempSrc: Oral  Oral   SpO2: 96% 93% 98%   Weight:      Height:       General appearance: Awake alert.  In no distress Resp: Clear to auscultation bilaterally.  Normal effort.  No stridor heard. Cardio: S1-S2 is normal regular.  No S3-S4.  No rubs murmurs or bruit GI: Abdomen is soft.  Nontender nondistended.  Bowel sounds are present normal.  No masses organomegaly    DISPOSITION: Back to her independent living facility.  Discharge Instructions    Call MD for:  difficulty breathing, headache or visual disturbances   Complete by: As directed    Call MD for:  extreme fatigue   Complete by: As directed    Call MD for:  hives   Complete by: As directed    Call MD for:  persistant dizziness or light-headedness   Complete by: As directed    Call MD for:  persistant nausea and vomiting   Complete by: As directed    Call MD for:  severe uncontrolled pain   Complete by: As directed    Call MD for:  temperature >100.4   Complete by: As directed  Discharge instructions   Complete by: As directed    The lung doctor will arrange follow-up in their office.  Please see your primary care provider.  Please follow instructions provided by the speech therapist.  Use oxygen at home as before. Please have your primary care provider refer you to a gastroenterologist for evaluation of the esophagus.  You were cared for by a hospitalist during your hospital stay. If you have any questions about your discharge medications or the care you received while you were in the hospital after you are discharged, you can call the unit and asked to speak with the hospitalist on call if the  hospitalist that took care of you is not available. Once you are discharged, your primary care physician will handle any further medical issues. Please note that NO REFILLS for any discharge medications will be authorized once you are discharged, as it is imperative that you return to your primary care physician (or establish a relationship with a primary care physician if you do not have one) for your aftercare needs so that they can reassess your need for medications and monitor your lab values. If you do not have a primary care physician, you can call [phone] for a physician referral.   Increase activity slowly   Complete by: As directed        Allergies as of 06/15/2020      Reactions   Abilify [aripiprazole] Other (See Comments)   parkinsonism   Penicillins Itching, Swelling, Rash   Tolerated Cefepime in ED. Has patient had a PCN reaction causing immediate rash, facial/tongue/throat swelling, SOB or lightheadedness with hypotension: Yes Has patient had a PCN reaction causing severe rash involving mucus membranes or skin necrosis: No Has patient had a PCN reaction that required hospitalization: No  Has patient had a PCN reaction occurring within the last 10 years: No      Medication List    STOP taking these medications   diclofenac sodium 1 % Gel Commonly known as: VOLTAREN   minocycline 50 MG capsule Commonly known as: MINOCIN   Vitamin D (Ergocalciferol) 1.25 MG (50000 UNIT) Caps capsule Commonly known as: DRISDOL     TAKE these medications   albuterol 108 (90 Base) MCG/ACT inhaler Commonly known as: VENTOLIN HFA Inhale 1-2 puffs into the lungs every 6 (six) hours as needed for wheezing or shortness of breath.   alendronate 70 MG tablet Commonly known as: FOSAMAX Take 1 tablet (70 mg total) by mouth every 7 (seven) days. Take with a full glass of water on an empty stomach, first thing in the morning and remain up right for 30 minutes after taking.   ALPRAZolam 1 MG  tablet Commonly known as: XANAX TAKE 1 TABLET THREE TIMES DAILY AS NEEDED FOR ANXIETY. What changed:   how much to take  how to take this  when to take this  reasons to take this  additional instructions   aspirin 81 MG tablet Take 81 mg by mouth at bedtime.   cyclobenzaprine 5 MG tablet Commonly known as: FLEXERIL Take 1 tablet (5 mg total) by mouth 3 (three) times daily as needed for muscle spasms.   donepezil 10 MG tablet Commonly known as: Aricept Take 1 tablet (10 mg total) by mouth at bedtime.   DULoxetine 60 MG capsule Commonly known as: CYMBALTA TAKE 1 CAPSULE A DAY TO BE COMBINED WITH 30MG  CAPSULE What changed: See the new instructions.   DULoxetine 30 MG capsule Commonly known as: CYMBALTA TAKE  1 CAPSULE ONCE DAILY ALONG WITH 60MG  CAPSULE FOR TOTAL 90MG  DAILY. What changed: See the new instructions.   EPINEPHrine 0.3 mg/0.3 mL Soaj injection Commonly known as: EPI-PEN Inject 0.3 mLs (0.3 mg total) into the muscle as needed for anaphylaxis.   fluconazole 100 MG tablet Commonly known as: DIFLUCAN TAKE (1) TABLET DAILY AS NEEDED FOR THRUSH What changed: See the new instructions.   fluticasone 50 MCG/ACT nasal spray Commonly known as: FLONASE Place 2 sprays into both nostrils daily.   furosemide 40 MG tablet Commonly known as: LASIX Take 1 tablet (40 mg total) by mouth daily as needed for fluid.   ipratropium 0.03 % nasal spray Commonly known as: ATROVENT Place 2 sprays into both nostrils every 12 (twelve) hours.   isosorbide mononitrate 30 MG 24 hr tablet Commonly known as: IMDUR TAKE 2 TABLETS IN THE MORNING AND 1 TABLET IN THE EVENING. What changed:   how much to take  how to take this  when to take this  additional instructions   lamoTRIgine 150 MG tablet Commonly known as: LAMICTAL TAKE 1 TABLET EACH DAY. What changed: See the new instructions.   metoprolol succinate 50 MG 24 hr tablet Commonly known as: TOPROL-XL TAKE (1) TABLET  DAILY IN THE MORNING. What changed:   how much to take  how to take this  when to take this  additional instructions   multivitamin with minerals Tabs tablet Take 1 tablet by mouth daily.   nitroGLYCERIN 0.4 MG SL tablet Commonly known as: NITROSTAT Place 1 tablet (0.4 mg total) under the tongue every 5 (five) minutes as needed for chest pain (x 3 doses).   ondansetron 8 MG tablet Commonly known as: Zofran Take 1 tablet (8 mg total) by mouth every 8 (eight) hours as needed for nausea or vomiting.   oxyCODONE-acetaminophen 10-325 MG tablet Commonly known as: PERCOCET Every 6 hours as needed What changed:   how much to take  how to take this  when to take this  reasons to take this   pantoprazole 40 MG tablet Commonly known as: PROTONIX TAKE 1 TABLET BY MOUTH DAILY.   rosuvastatin 20 MG tablet Commonly known as: CRESTOR Take 1 tablet (20 mg total) by mouth daily.   solifenacin 10 MG tablet Commonly known as: VESIcare Take 1 tablet (10 mg total) by mouth daily.   traZODone 50 MG tablet Commonly known as: DESYREL TAKE 2-4 TABLETS AT BEDTIME What changed: See the new instructions.         Follow-up Information    McGowen, Adrian Blackwater, MD. Schedule an appointment as soon as possible for a visit in 1 week(s).   Specialty: Family Medicine Contact information: [address] [place] [address] [phone]        Brand Males, MD Follow up on 07/13/2020.   Specialty: Pulmonary Disease Why: Appt at 3:45 PM. Please arrive at 3:30 for check in Contact information: [place] [address] [phone]               TOTAL DISCHARGE TIME: 35 minutes  [facility] Pager on [email or web address]  06/15/2020, 1:55 PM

## 2020-06-15 NOTE — Progress Notes (Signed)
pts voice is a bit stronger/louder and minimal stridor noted

## 2020-06-15 NOTE — ED Provider Notes (Signed)
[facility] DEPT Provider Note   CSN: [id number] Arrival date & time: 06/15/20  1615     History Chief Complaint  Patient presents with  . Chest Pain    JANETTE HARVIE is a 74 y.o. female hx of COPD, HTN, who presenting with chest pain, stridor.  Patient has stridor after eating chicken nuggets yesterday.  Patient was admitted to the hospital.  She initially had a nasopharyngeal scope that showed no airway obstruction. Patient was given steroids and had a CT chest that shows no pneumomediastinum.  Patient was admitted overnight and was discharged this morning.  Patient had a speech and swallow study done this morning that showed some mild aspiration and was recommended to have GI follow-up as well as thickened liquids.  Patient states that 10 minutes after she left the hospital, she immediately has stridor again.  She denies aspirating on any medicines or food in the hospital.  Patient states that she took 3 nitros and felt only slightly better.  The history is provided by the patient.       Past Medical History:  Diagnosis Date  . Acute upper GI bleed 06/2014   while pt taking coumadin, plavix, and meloxicam---despite being told not to take coumadin.  . Anginal pain ([place])    Nonobstructive CAD 2014; however, her cardiologist put her on a statin for this and NOT for hyperlipidemia per pt report.  Atyp CP 08/2017 at card f/u, plan for myoc perf imaging.  . Anxiety    panic attacks  . Asthma    w/ asbestososis   . BPPV (benign paroxysmal positional vertigo) 12/16/2012  . Chronic diastolic CHF (congestive heart failure) (HCC)    dry wt as of 11/06/16 is 168 lbs.  . Chronic lower back pain   . COPD (chronic obstructive pulmonary disease) ([place])   . DDD (degenerative disc disease)    lumbar and cervical.   . Diverticular disease   . Fibromyalgia    Patient states dx was around her late 59s but she had sx's for years prior to this.  . H/O hiatal  hernia   . History of pneumonia    hospitalized 12/2011, 02/2013, and 07/2013 [facility]) for this  . HTN (hypertension)    Renal artery dopplers 04/2013 neg for stenosis.  . Hypervitaminosis D 09/27/2019   over-supplemented.  Stopped vit D and plan recheck 2 mo.  . Idiopathic angio-edema-urticaria [id number]; 2021   Angioedema component was very minimal.  2021->Dr. Bobbitt (allergist) eval.  . Insomnia   . Iron deficiency anemia    Hematologist in [place], [place] did extensive w/u; no cause found; failed oral supplement;; gets fairly regular (q61m or so) IV iron infusions (Venofer -iron sucrose- 200mg  with procrit.  "for 14 yr I've been getting blood work q month & getting infusions prn" (07/12/2013).  Dr. Marin Olp locally, iron infusions done, EPO deficiency dx'd  . Migraine syndrome    "not as often anymore; used to be ~ q wk" (07/12/2013)  . Mixed incontinence urge and stress   . Nephrolithiasis    "passed all on my own or they are still in there" (07/12/2013)  . Neuroleptic induced Parkinsonism ([place]) 2018   Dr. Carles Collet, neuro, saw her 11/24/17 and recommended d/c of abilify as first step.  D/c'd abilify and pt got complete recovery.  . OSA on CPAP    prior to move to [place]--had another sleep study 10/2015 w/pulm Dr. Camillo Flaming.  . Osteoarthritis    "severe; progressing fast" (  07/12/2013); multiple joints-not surgical candidate for TKR (03/2015).  Triamcinolon knee injections by Dr. Tessa Lerner 12/2017.  [name] Kitchen Pernicious anemia 08/24/2014  . Pleural plaque with presence of asbestos 07/22/2013  . Pulmonary embolism ([place]) 07/2013   Dx at [facility] with very small peripheral upper lobe pe 07/2013: pt took coumadin for about 8-9 mo  . Pyelonephritis    "several times over the last 30 yr" (07/12/2013)  . RBBB (right bundle branch block)   . Recurrent major depression ([place])   . Recurrent UTI    hx of hospitalization for pyelonephritis; started abx prophylaxis 06/2015  . Syncope    Hypotensive; ED visit--Dr. Terrence Dupont did Cath--nonobstructive  CAD, EF 55-60%.  In retrospect, suspect pt rx med misuse/polypharmacy    Patient Active Problem List   Diagnosis Date Noted  . Choking due to food (regurgitated) 06/14/2020  . Allergic reaction 02/01/2020  . Slurred speech 01/21/2020  . Idiopathic angioedema 01/20/2020  . Dysarthria 01/20/2020  . Hypotension 10/04/2019  . COPD (chronic obstructive pulmonary disease) ([place]) 01/21/2018  . Pulmonary asbestosis ([place]) 01/21/2018  . Influenza A 01/02/2018  . Hypomagnesemia 01/02/2018  . Failure to thrive in adult 01/01/2018  . Altered mental status 10/21/2017  . Lumbar radiculitis 02/10/2017  . Dehydration 12/04/2016  . Low serum erythropoietin level 10/17/2016  . RBBB 09/23/2016  . Primary osteoarthritis of left knee 06/19/2016  . Debilitated patient 06/06/2016  . CHF (congestive heart failure) ([place]) 05/30/2016  . SOB (shortness of breath)   . Chronic respiratory failure with hypoxia ([place]) 02/07/2016  . Restrictive lung disease 02/07/2016  . Rotator cuff syndrome of right shoulder 01/10/2016  . UTI (urinary tract infection) 01/01/2016  . Encephalopathy, metabolic 17/00/1749  . CAP (community acquired pneumonia) 01/01/2016  . Fall at home 01/01/2016  . Rhabdomyolysis 01/01/2016  . Diastolic heart failure ([place]) 10/16/2015  . CAD-minor 2014 08/16/2015  . Chest pain with moderate risk for cardiac etiology 08/16/2015  . Narrowing of intervertebral disc space 07/17/2015  . Bilateral lower leg pain 01/24/2015  . 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 Chronic pain syndrome 11/21/2014  . Anxiety and depression 11/21/2014  . Abnormal grief reaction 11/21/2014  . Hypokalemia 11/21/2014  . Hyperlipidemia 11/21/2014  . Chronic pain disorder 11/21/2014  . Primary osteoarthritis of right knee 10/18/2014  . Pernicious anemia 08/24/2014  . Generalized anxiety disorder--with occasional panic attacks.  08/05/2014  . Recurrent major depression-severe ([place]) 08/05/2014  . Multifactorial gait  disorder 07/26/2014  . Epistaxis 07/18/2014  . Acute GI bleeding 07/17/2014  . Anemia associated with acute blood loss 07/17/2014  . Syncope 07/17/2014  . History of pulmonary embolism 07/17/2014  . GI bleed 07/17/2014  . Arthritis 05/11/2014  . DDD (degenerative disc disease) 05/11/2014  . Fibrositis 05/11/2014  . Amianthosis ([place]) 05/11/2014  . [name] ([place]) 05/11/2014  . Grief 04/28/2014  . OSA (obstructive sleep apnea) 04/24/2014  . Chronic diastolic CHF, NYHA class 1 04/24/2014  . Acute respiratory failure ([place]) 08/20/2013  . Atelectasis 08/06/2013  . Acute pulmonary embolism ([place]) 08/04/2013  . Pulmonary embolism ([place]) 08/04/2013  . Pleural plaque with presence of asbestos 07/22/2013  . Polypharmacy 04/26/2013  . Fibromyalgia syndrome 03/01/2013  . Insomnia 11/12/2012  . HTN (hypertension), benign 10/25/2012    Past Surgical History:  Procedure Laterality Date  . APPENDECTOMY  1960  . AXILLARY SURGERY Left 1978   Multiple "lump" in armpit per pt  . CARDIAC CATHETERIZATION  01/2013   nonobstructive CAD, EF 55-60%  . CARDIOVASCULAR STRESS TEST  02/22/15   Low risk myocard perf imaging; wall motion normal, normal EF  . carotid duplex doppler  10/21/2017   R vertebral flow suggestive of possible distal obstruction.  Pt declined further w/u as of 10/29/17 but need to revisit this problem periodically.  . COCCYX REMOVAL  1972  . DEXA  06/05/2017   T-score -3.1  . DILATION AND CURETTAGE OF UTERUS  ? 1970's  . ESOPHAGOGASTRODUODENOSCOPY N/A 07/19/2014   Gastritis found + in the setting of supratherapeutic INR, +plavix, + meloxicam.  . EYE SURGERY Left 2012-2013   "injections for ~ 1 yr; don't really know what for" (07/12/2013)  . HEEL SPUR SURGERY Left 2008  . KNEE SURGERY  2005  . LEFT HEART CATHETERIZATION WITH CORONARY ANGIOGRAM N/A 01/30/2013   Procedure: LEFT HEART CATHETERIZATION WITH CORONARY ANGIOGRAM;  Surgeon: Clent Demark, MD;  Location: [facility] CATH LAB;  Service:  Cardiovascular;  Laterality: N/A;  . PLANTAR FASCIA RELEASE Left 2008  . SPIROMETRY  04/25/14   In hosp for acute asthma/COPD flare: mixed obstructive and restrictive lung disease. The FEV1 is severely reduced at 45% predicted.  FEV1 signif decreased compared to prior spirometry 07/23/13.  . TENDON RELEASE  1996   Right forearm and hand  . TOTAL ABDOMINAL HYSTERECTOMY  1974  . TRANSTHORACIC ECHOCARDIOGRAM  01/2013; 04/2014;08/2015; 09/2017   2014--NORMAL.  2015--focal basal septal hypertrophy, EF 55-60%, grade I diast dysfxn, mild LAE.  08/2015 EF 55-60%, nl LV syst fxn, grade I DD, valves wnl. 10/21/17: EF 65-70%, grd I DD, o/w normal.     OB History   No obstetric history on file.     Family History  Problem Relation Age of Onset  . Arthritis Mother   . Kidney disease Mother   . Heart disease Father   . Stroke Father   . Hypertension Father   . Diabetes Father   . Heart attack Father   . Heart attack Paternal Grandmother   . Diabetes Sister        one sister  . Hypertension Sister   . Asthma Sister   . Hypertension Brother   . Asthma Brother   . Asthma Daughter   . Multiple sclerosis Son     Social History   Tobacco Use  . Smoking status: Never Smoker  . Smokeless tobacco: Never Used  . Tobacco comment: never used tobacco  Vaping Use  . Vaping Use: Never used  Substance Use Topics  . Alcohol use: No    Alcohol/week: 0.0 standard drinks  . Drug use: No    Home Medications Prior to Admission medications   Medication Sig Start Date End Date Taking? Authorizing Provider  albuterol (VENTOLIN HFA) 108 (90 Base) MCG/ACT inhaler Inhale 1-2 puffs into the lungs every 6 (six) hours as needed for wheezing or shortness of breath. 12/06/19   McGowen, Adrian Blackwater, MD  alendronate (FOSAMAX) 70 MG tablet Take 1 tablet (70 mg total) by mouth every 7 (seven) days. Take with a full glass of water on an empty stomach, first thing in the morning and remain up right for 30 minutes after  taking. 07/26/19   McGowen, Adrian Blackwater, MD  ALPRAZolam (XANAX) 1 MG tablet TAKE 1 TABLET THREE TIMES DAILY AS NEEDED FOR ANXIETY. Patient taking differently: Take 1 mg by mouth 3 (three) times daily as needed for anxiety or sleep.  05/29/20   McGowen, Adrian Blackwater, MD  aspirin 81 MG tablet Take 81 mg by mouth at bedtime.    [provider]  cyclobenzaprine (FLEXERIL) 5 MG tablet Take 1 tablet (5 mg total) by mouth 3 (three) times daily as needed for muscle spasms. 05/24/20   Meredith Staggers, MD  donepezil (ARICEPT) 10 MG tablet Take 1 tablet (10 mg total) by mouth at bedtime. 01/31/20   McGowen, Adrian Blackwater, MD  DULoxetine (CYMBALTA) 30 MG capsule TAKE 1 CAPSULE ONCE DAILY ALONG WITH 60MG  CAPSULE FOR TOTAL 90MG  DAILY. Patient taking differently: Take by mouth daily. Along with 60 mg capsule for total 90 mg. 04/17/20   McGowen, Adrian Blackwater, MD  DULoxetine (CYMBALTA) 60 MG capsule TAKE 1 CAPSULE A DAY TO BE COMBINED WITH 30MG  CAPSULE Patient taking differently: Take 60 mg by mouth daily. Take with 30mg  to total 90mg  daily. 07/27/19   McGowen, Adrian Blackwater, MD  EPINEPHrine 0.3 mg/0.3 mL IJ SOAJ injection Inject 0.3 mLs (0.3 mg total) into the muscle as needed for anaphylaxis. 01/22/20   Geradine Girt, DO  fluconazole (DIFLUCAN) 100 MG tablet TAKE (1) TABLET DAILY AS NEEDED FOR THRUSH Patient taking differently: Take 100 mg by mouth daily as needed (for thrush).  05/03/20   McGowen, Adrian Blackwater, MD  fluticasone (FLONASE) 50 MCG/ACT nasal spray Place 2 sprays into both nostrils daily. 09/27/19   McGowen, Adrian Blackwater, MD  furosemide (LASIX) 40 MG tablet Take 1 tablet (40 mg total) by mouth daily as needed for fluid. 10/07/19   Cherylann Ratel A, DO  ipratropium (ATROVENT) 0.03 % nasal spray Place 2 sprays into both nostrils every 12 (twelve) hours. 09/27/19   McGowen, Adrian Blackwater, MD  isosorbide mononitrate (IMDUR) 30 MG 24 hr tablet TAKE 2 TABLETS IN THE MORNING AND 1 TABLET IN THE EVENING. Patient taking differently: Take 30 mg by  mouth See admin instructions. Takes 2 tablets by mouth in the morning and 1 tablet in the evening. 05/24/20   Lelon Perla, MD  lamoTRIgine (LAMICTAL) 150 MG tablet TAKE 1 TABLET EACH DAY. Patient taking differently: Take 150 mg by mouth daily.  11/22/19   McGowen, Adrian Blackwater, MD  metoprolol succinate (TOPROL-XL) 50 MG 24 hr tablet TAKE (1) TABLET DAILY IN THE MORNING. Patient taking differently: Take 50 mg by mouth daily.  07/27/19   McGowen, Adrian Blackwater, MD  Multiple Vitamin (MULTIVITAMIN WITH MINERALS) TABS tablet Take 1 tablet by mouth daily.    [provider]  nitroGLYCERIN (NITROSTAT) 0.4 MG SL tablet Place 1 tablet (0.4 mg total) under the tongue every 5 (five) minutes as needed for chest pain (x 3 doses). 09/23/19   Lelon Perla, MD  ondansetron (ZOFRAN) 8 MG tablet Take 1 tablet (8 mg total) by mouth every 8 (eight) hours as needed for nausea or vomiting. 10/22/19   McGowen, Adrian Blackwater, MD  oxyCODONE-acetaminophen (PERCOCET) 10-325 MG tablet Every 6 hours as needed Patient taking differently: Take 1 tablet by mouth every 6 (six) hours as needed for pain. Every 6 hours as needed 05/24/20   Meredith Staggers, MD  pantoprazole (PROTONIX) 40 MG tablet TAKE 1 TABLET BY MOUTH DAILY. Patient taking differently: Take 40 mg by mouth daily.  12/10/19   McGowen, Adrian Blackwater, MD  rosuvastatin (CRESTOR) 20 MG tablet Take 1 tablet (20 mg total) by mouth daily. 09/23/19 02/28/28  Lelon Perla, MD  solifenacin (VESICARE) 10 MG tablet Take 1 tablet (10 mg total) by mouth daily. 04/28/20   McGowen, Adrian Blackwater, MD  traZODone (DESYREL) 50 MG tablet TAKE 2-4 TABLETS AT BEDTIME Patient taking differently: Take 150  mg by mouth at bedtime.  12/10/19   McGowen, Adrian Blackwater, MD    Allergies    Abilify [aripiprazole] and Penicillins  Review of Systems   Review of Systems  Cardiovascular: Positive for chest pain.  All other systems reviewed and are negative.   Physical Exam Updated Vital Signs BP 138/82    Pulse 75   Temp 98.2 F (36.8 C) (Oral)   Resp (!) 24   SpO2 95%   Physical Exam Vitals and nursing note reviewed.  Constitutional:      Comments: Anxious   HENT:     Head: Normocephalic.  Eyes:     Pupils: Pupils are equal, round, and reactive to light.  Neck:     Comments: Upper airway noises vs stridor  Cardiovascular:     Rate and Rhythm: Normal rate and regular rhythm.     Heart sounds: Normal heart sounds.  Pulmonary:     Breath sounds: Normal breath sounds.  Abdominal:     General: Bowel sounds are normal.     Palpations: Abdomen is soft.  Musculoskeletal:     Cervical back: Normal range of motion.  Neurological:     General: No focal deficit present.  Psychiatric:     Comments: Anxious      ED Results / Procedures / Treatments   Labs (all labs ordered are listed, but only abnormal results are displayed) Labs Reviewed  BASIC METABOLIC PANEL - Abnormal; Notable for the following components:      Result Value   Glucose, Bld 108 (*)    Calcium 8.8 (*)    All other components within normal limits  CBC - Abnormal; Notable for the following components:   WBC 13.8 (*)    All other components within normal limits  TROPONIN I (HIGH SENSITIVITY)  TROPONIN I (HIGH SENSITIVITY)    EKG EKG Interpretation  Date/Time:  Thursday June 15 2020 16:17:46 EDT Ventricular Rate:  83 PR Interval:    QRS Duration: 146 QT Interval:  400 QTC Calculation: 470 R Axis:   -10 Text Interpretation: Sinus rhythm Probable left atrial enlargement Right bundle branch block 12 Lead; [place] No significant change since last tracing Confirmed by Wandra Arthurs [phone]) on 06/15/2020 5:53:17 PM   Radiology DG Neck Soft Tissue  Result Date: 06/14/2020 CLINICAL DATA:  Choking episode. EXAM: NECK SOFT TISSUES - 1+ VIEW COMPARISON:  Chest x-ray dated December 06, 2019. CT cervical spine dated October 21, 2017. FINDINGS: There is no evidence of retropharyngeal soft tissue swelling or  epiglottic enlargement. The cervical airway is unremarkable and no radio-opaque foreign body identified. Bilateral carotid artery calcific atherosclerosis. Bilateral calcified pleural plaques in the lungs again noted. IMPRESSION: 1. Negative.  No radiopaque foreign body identified. Electronically Signed   By: Titus Dubin M.D.   On: 06/14/2020 14:39   DG Chest 2 View  Result Date: 06/15/2020 CLINICAL DATA:  Chest pain. EXAM: CHEST - 2 VIEW COMPARISON:  CT chest and chest x-ray from yesterday. FINDINGS: The heart size and mediastinal contours are within normal limits. Normal pulmonary vascularity. Bilateral calcified pleural plaques again noted. No focal consolidation, pleural effusion, or pneumothorax. Multiple thoracic compression deformities again noted. Oral contrast within the visualized bowel. IMPRESSION: 1. No acute cardiopulmonary disease. Electronically Signed   By: Titus Dubin M.D.   On: 06/15/2020 17:35   CT CHEST WO CONTRAST  Result Date: 06/14/2020 CLINICAL DATA:  74 year old female with history of aspiration. EXAM: CT CHEST WITHOUT CONTRAST TECHNIQUE: Multidetector CT  imaging of the chest was performed following the standard protocol without IV contrast. COMPARISON:  Chest CT 01/01/2016. FINDINGS: Cardiovascular: Heart size is normal. There is no significant pericardial fluid, thickening or pericardial calcification. There is aortic atherosclerosis, as well as atherosclerosis of the great vessels of the mediastinum and the coronary arteries, including calcified atherosclerotic plaque in the left main, left anterior descending, left circumflex and right coronary arteries. Mediastinum/Nodes: No pathologically enlarged mediastinal or hilar lymph nodes. Please note that accurate exclusion of hilar adenopathy is limited on noncontrast CT scans. Esophagus is unremarkable in appearance. No axillary lymphadenopathy. Lungs/Pleura: Numerous calcified pleural plaques bilaterally, indicative of  asbestos related pleural disease. No pleural effusions. No suspicious appearing pulmonary nodules or masses are noted. No acute consolidative airspace disease. Some peripheral predominant areas of ground-glass attenuation and parenchymal banding throughout the mid to lower lungs bilaterally. No honeycombing. Upper Abdomen: Aortic atherosclerosis. Musculoskeletal: New compression fractures of T7 and T9, most severe at T9 with 40% loss of anterior vertebral body height. There are no aggressive appearing lytic or blastic lesions noted in the visualized portions of the skeleton. IMPRESSION: 1. No imaging findings to suggest significant aspiration. 2. Asbestos related pleural disease with imaging findings in the lung bases bilaterally concerning for developing interstitial lung disease such as asbestosis. Outpatient referral to Pulmonology for appropriate follow-up is recommended. 3. Aortic atherosclerosis, in addition to left main and 3 vessel coronary artery disease. Assessment for potential risk factor modification, dietary therapy or pharmacologic therapy may be warranted, if clinically indicated. 4. New compression fractures of T7 and T9, most severe at T9 where there is 40% loss of anterior vertebral body height. Aortic Atherosclerosis (ICD10-I70.0). Electronically Signed   By: Vinnie Langton M.D.   On: 06/14/2020 16:17   DG Chest Portable 1 View  Result Date: 06/14/2020 CLINICAL DATA:  Choking episode today. EXAM: PORTABLE CHEST 1 VIEW COMPARISON:  Single-view of the chest 12/06/2019. FINDINGS: Calcified pleural plaques are again seen. Lungs are clear. Heart size is enlarged. Aortic atherosclerosis. No pneumothorax or pleural fluid. IMPRESSION: No acute disease. Calcified pleural plaques consistent with prior asbestos exposure. Cardiomegaly. Aortic Atherosclerosis (ICD10-I70.0). Electronically Signed   By: Inge Rise M.D.   On: 06/14/2020 14:37    Procedures Procedures (including critical care  time)  CRITICAL CARE Performed by: Wandra Arthurs   Total critical care time: 30 minutes  Critical care time was exclusive of separately billable procedures and treating other patients.  Critical care was necessary to treat or prevent imminent or life-threatening deterioration.  Critical care was time spent personally by me on the following activities: development of treatment plan with patient and/or surrogate as well as nursing, discussions with consultants, evaluation of patient's response to treatment, examination of patient, obtaining history from patient or surrogate, ordering and performing treatments and interventions, ordering and review of laboratory studies, ordering and review of radiographic studies, pulse oximetry and re-evaluation of patient's condition.   Medications Ordered in ED Medications  dexamethasone (DECADRON) injection 10 mg (has no administration in time range)  Racepinephrine HCl 2.25 % nebulizer solution 0.5 mL (has no administration in time range)    ED Course  I have reviewed the triage vital signs and the nursing notes.  Pertinent labs & imaging results that were available during my care of the patient were reviewed by me and considered in my medical decision making (see chart for details).    MDM Rules/Calculators/A&P  ELISANDRA DESHMUKH is a 74 y.o. female here presenting with recurrent stridor. Reviewed chart from yesterday's admission.  Patient had a CT chest that was unremarkable.  Patient also had a a nasopharyngeal scope and speech and swallow study that showed mild aspiration and no airway obstruction.  Patient had a video bronchoscopy planned today but was canceled since her stridor improved.  Patient has some mild stridor again versus upper airway noises.  I think at this point, will consult critical care again regarding getting bronchoscopy.  We will try racemic epi and Decadron as well.  7:02 PM Patient received racemic  epi and still has some upper airway noises.  Patient's oxygen saturation is normal.  I discussed case with Dr. Vanessa Kick from critical care.  He saw patient this morning and reassess patient just now.  He recommend n.p.o. after midnight and bronchoscopy in the morning.  Thinks is likely vocal cord dysfunction and recommend IV steroids.  Hospitalist to readmit  Final Clinical Impression(s) / ED Diagnoses Final diagnoses:  None    Rx / DC Orders ED Discharge Orders    None       Drenda Freeze, MD 06/15/20 [phone]

## 2020-06-15 NOTE — Progress Notes (Signed)
Patient was in no distress at time. Took patient to main entrance and was waiting for her daughter in law Melissa to pick her up. Patient started to have chest pain. She stated that it felt like pressure. And she found and took her personal supply of nitro. When daughter in law arrived I informed her that patient was having chest pain and had taken one dose of sublingual nitro. Daughter in law and patient verbalized understanding and was aware to call or go to ED if the problem continued.   Patient daughter in law called back in less than 10 minutes of me returning to the unit. She stated that patient had already taking 3 doses of nitro and she was bringing patient back to ED. Charge Nurse Nancy Fetter, RN spoke w/ patient daughter in law and instructed her to take patient to the ED and that patient would be a priority with her symptoms of chest pain.

## 2020-06-16 ENCOUNTER — Encounter (HOSPITAL_COMMUNITY)
Admission: EM | Disposition: A | Discharge status: Home / Self Care | Payer: Self-pay | Source: Home / Self Care | Attending: Internal Medicine

## 2020-06-16 ENCOUNTER — Telehealth: Payer: Self-pay | Admitting: Family Medicine

## 2020-06-16 DIAGNOSIS — M797 Fibromyalgia: Secondary | ICD-10-CM

## 2020-06-16 DIAGNOSIS — F329 Major depressive disorder, single episode, unspecified: Secondary | ICD-10-CM

## 2020-06-16 DIAGNOSIS — J439 Emphysema, unspecified: Secondary | ICD-10-CM

## 2020-06-16 DIAGNOSIS — R1312 Dysphagia, oropharyngeal phase: Secondary | ICD-10-CM

## 2020-06-16 DIAGNOSIS — R061 Stridor: Secondary | ICD-10-CM

## 2020-06-16 DIAGNOSIS — F419 Anxiety disorder, unspecified: Secondary | ICD-10-CM

## 2020-06-16 DIAGNOSIS — I5032 Chronic diastolic (congestive) heart failure: Secondary | ICD-10-CM

## 2020-06-16 LAB — GLUCOSE, CAPILLARY: Glucose-Capillary: 137 mg/dL — ABNORMAL HIGH (ref 70–99)

## 2020-06-16 LAB — COMPREHENSIVE METABOLIC PANEL
ALT: 26 U/L (ref 0–44)
AST: 25 U/L (ref 15–41)
Albumin: 4.2 g/dL (ref 3.5–5.0)
Alkaline Phosphatase: 61 U/L (ref 38–126)
Anion gap: 10 (ref 5–15)
BUN: 21 mg/dL (ref 8–23)
CO2: 27 mmol/L (ref 22–32)
Calcium: 9.1 mg/dL (ref 8.9–10.3)
Chloride: 101 mmol/L (ref 98–111)
Creatinine, Ser: 0.72 mg/dL (ref 0.44–1.00)
GFR calc Af Amer: >60 mL/min (ref >60)
GFR calc non Af Amer: >60 mL/min (ref >60)
Glucose, Bld: 147 mg/dL — ABNORMAL HIGH (ref 70–99)
Potassium: 4 mmol/L (ref 3.5–5.1)
Sodium: 138 mmol/L (ref 135–145)
Total Bilirubin: 0.5 mg/dL (ref 0.3–1.2)
Total Protein: 7.6 g/dL (ref 6.5–8.1)

## 2020-06-16 LAB — CBC
HCT: 46.4 % — ABNORMAL HIGH (ref 36.0–46.0)
Hemoglobin: 15.2 g/dL — ABNORMAL HIGH (ref 12.0–15.0)
MCH: 29.6 pg (ref 26.0–34.0)
MCHC: 32.8 g/dL (ref 30.0–36.0)
MCV: 90.3 fL (ref 80.0–100.0)
Platelets: 203 10*3/uL (ref 150–400)
RBC: 5.14 MIL/uL — ABNORMAL HIGH (ref 3.87–5.11)
RDW: 13.1 % (ref 11.5–15.5)
WBC: 9.9 10*3/uL (ref 4.0–10.5)
nRBC: 0 % (ref 0.0–0.2)

## 2020-06-16 LAB — PROTIME-INR
INR: 1 (ref 0.8–1.2)
Prothrombin Time: 12.8 seconds (ref 11.4–15.2)

## 2020-06-16 SURGERY — CANCELLED PROCEDURE

## 2020-06-16 MED ORDER — IPRATROPIUM-ALBUTEROL 0.5-2.5 (3) MG/3ML IN SOLN
3.0000 mL | RESPIRATORY_TRACT | Status: DC | PRN
  Administered 2020-06-16: 3 mL via RESPIRATORY_TRACT
  Filled 2020-06-16: qty 3

## 2020-06-16 NOTE — Progress Notes (Signed)
Pt arrived to room 1533 from ED. Pt is currently having some neck and back pain but her VS are stable and she is resting comfortably. Will continue to monitor.

## 2020-06-16 NOTE — ED Notes (Signed)
Patient requesting to leave.  MD made aware.

## 2020-06-16 NOTE — ED Notes (Signed)
Pt has been off the floor longer than this chart appears, ED bed has returned .

## 2020-06-16 NOTE — ED Notes (Signed)
Though patient still states she would like to go home, she has now agreed to transfer upstair and talk with the MD.

## 2020-06-16 NOTE — Telephone Encounter (Signed)
Patient DC on 06/15/20 readmitted on 06/16/20.

## 2020-06-16 NOTE — Progress Notes (Signed)
PROGRESS NOTE  Linda [name] MBW:[id number] DOB: 06/19/1946 DOA: 06/15/2020 PCP: Tammi Sou, MD  HPI/Recap of past 24 hours: HPI from Dr Phineas Real is a 74 y.o. female with past medical history of dementia, osteoporosis, osteoarthritis, [name], asbestosis, anxiety, fibromyalgia, recent diagnosis of dysphagia and likely vocal cord dysfunction. Patient presented with complaints of choking on the food.  She was recently hospitalized on 06/14/2020 for the same.  Pulmonary was consulted due to presence of the stridor.  Patient was able to swallow safely, stridor resolved and was subsequently discharged. While waiting for the ride back home she was eating a shredded chicken wrap and started having choking episode which led to severe chest pain for which she took 3 nitroglycerin and came back to the hospital.  In the ED, patient also noted to have some stridor/upper airway noises, patient was started on racemic epi and Decadron.  PCCM was consulted for possible bronchoscopy.  Patient admitted for further management.    Today, patient reports feeling better, denies any further choking episodes, chest pain/discomfort, worsening shortness of breath, abdominal pain, fever/chills.  Spent about 30 minutes discussing extensively with patient about the need for possible work-up to determine cause of her choking episode.  Patient noted to be fixated on complaining about other staff members, miscommunication and care she has received so far.  Patient also noted to be anxious, worried about general anesthesia as she has not had a good experience therefore refusing scheduled bronchoscopy.  Initially reported she wanted to leave AMA, but eventually decided she would stay.    Assessment/Plan: Principal Problem:   Stridor Active Problems:   HTN (hypertension), benign   Fibromyalgia syndrome   OSA (obstructive sleep apnea)   Chronic diastolic CHF, NYHA class 1   History of pulmonary  embolism   Pernicious anemia   Anxiety and depression   RBBB   COPD (chronic obstructive pulmonary disease) (HCC)   Dysphagia   Possible stridor/esophageal impairment Possible vocal cord dysfunction Acute hypoxic respiratory failure Currently on about 2 L of O2, saturating well Chest x-ray, CT chest unremarkable for any evidence of aspiration ENT consulted performed flexible laryngoscopy on 6/23, reported normal upper airway evaluation.  No further recommendation PCCM consulted, patient refused bronchoscopy (concerns for anesthesia) SLP consulted, MBS done which showed patient at risk for food lodging in the cervical esophagus and recommended to avoid meats GI consulted for further evaluation, possible esophagram Continue racemic epinephrine neb as needed, steroids for now Continue dysphagia 1 diet thin liquids, avoid meats  History of COPD/asbestosis Continue steroids for now Follow-up with outpatient pulmonologist  Chronic diastolic HF Appears compensated Continue home meds  Fibromyalgia/chronic pain syndrome/anxiety Continue home pain regimen Advised for risk of aspiration   GERD Continue PPI  History of dementia Continue home regimen       Malnutrition Type:      Malnutrition Characteristics:      Nutrition Interventions:       Estimated body mass index is 25.86 kg/m as calculated from the following:   Height as of 06/14/20: 5\' 3"  (1.6 m).   Weight as of 06/14/20: 66.2 kg.     Code Status: Full  Family Communication: Discussed extensively with patient's, plan to discuss with son  Disposition Plan: Status is: Inpatient  Remains inpatient appropriate because:Inpatient level of care appropriate due to severity of illness   Dispo: The patient is from: ILF              Anticipated  d/c is to: ILF              Anticipated d/c date is: 1 day              Patient currently is not medically stable to  d/c.    Consultants:  PCCM  GI  Procedures: None  Antimicrobials:  None  DVT prophylaxis: Lovenox   Objective: Vitals:   06/16/20 1300 06/16/20 1315 06/16/20 1317 06/16/20 1330  BP: (!) 161/98  (!) 161/98   Pulse: 84 86 79 90  Resp: 13 14 11 17   Temp:      TempSrc:      SpO2: 99% 98% 98% 97%   No intake or output data in the 24 hours ending 06/16/20 1428 There were no vitals filed for this visit.  Exam:  General: NAD   Cardiovascular: S1, S2 present  Respiratory:  Diminished breath sounds bilaterally  Abdomen: Soft, nontender, nondistended, bowel sounds present  Musculoskeletal: No bilateral pedal edema noted  Skin: Normal  Psychiatry:  Anxious    Data Reviewed: CBC: Recent Labs  Lab 06/14/20 1450 06/15/20 0327 06/15/20 1710 06/16/20 0442  WBC 8.0 7.9 13.8* 9.9  NEUTROABS 4.4  --   --   --   HGB 13.5 13.6 14.6 15.2*  HCT 41.4 42.6 45.7 46.4*  MCV 91.2 91.4 89.8 90.3  PLT 188 191 215 893   Basic Metabolic Panel: Recent Labs  Lab 06/14/20 1450 06/15/20 0327 06/15/20 1710 06/16/20 0442  NA 138 137 135 138  K 4.1 4.3 4.1 4.0  CL 104 103 101 101  CO2 25 24 27 27   GLUCOSE 106* 132* 108* 147*  BUN 20 17 20 21   CREATININE 0.85 0.75 0.82 0.72  CALCIUM 8.6* 8.7* 8.8* 9.1   GFR: Estimated Creatinine Clearance: 56.4 mL/min (by C-G formula based on SCr of 0.72 mg/dL). Liver Function Tests: Recent Labs  Lab 06/16/20 0442  AST 25  ALT 26  ALKPHOS 61  BILITOT 0.5  PROT 7.6  ALBUMIN 4.2   No results for input(s): LIPASE, AMYLASE in the last 168 hours. No results for input(s): AMMONIA in the last 168 hours. Coagulation Profile: Recent Labs  Lab 06/16/20 0442  INR 1.0   Cardiac Enzymes: No results for input(s): CKTOTAL, CKMB, CKMBINDEX, TROPONINI in the last 168 hours. BNP (last 3 results) No results for input(s): PROBNP in the last 8760 hours. HbA1C: No results for input(s): HGBA1C in the last 72 hours. CBG: No results for  input(s): GLUCAP in the last 168 hours. Lipid Profile: No results for input(s): CHOL, HDL, LDLCALC, TRIG, CHOLHDL, LDLDIRECT in the last 72 hours. Thyroid Function Tests: No results for input(s): TSH, T4TOTAL, FREET4, T3FREE, THYROIDAB in the last 72 hours. Anemia Panel: No results for input(s): VITAMINB12, FOLATE, FERRITIN, TIBC, IRON, RETICCTPCT in the last 72 hours. Urine analysis:    Component Value Date/Time   COLORURINE YELLOW 10/04/2019 [place] 10/04/2019 1305   LABSPEC 1.004 (L) 10/04/2019 1305   PHURINE 6.0 10/04/2019 1305   GLUCOSEU NEGATIVE 10/04/2019 1305   HGBUR NEGATIVE 10/04/2019 [place] 10/04/2019 1305   BILIRUBINUR Negative 08/07/2018 [place] 10/04/2019 1305   PROTEINUR NEGATIVE 10/04/2019 1305   UROBILINOGEN 0.2 08/07/2018 1437   UROBILINOGEN 1.0 10/16/2015 2009   NITRITE POSITIVE (A) 10/04/2019 1305   LEUKOCYTESUR NEGATIVE 10/04/2019 1305   Sepsis Labs: @LABRCNTIP (procalcitonin:4,lacticidven:4)  ) Recent Results (from the past 240 hour(s))  SARS Coronavirus 2 by RT  PCR (hospital order, performed in [facility] hospital lab) Nasopharyngeal Nasopharyngeal Swab     Status: None   Collection Time: 06/14/20  2:03 PM   Specimen: Nasopharyngeal Swab  Result Value Ref Range Status   SARS Coronavirus 2 NEGATIVE NEGATIVE Final    Comment: (NOTE) SARS-CoV-2 target nucleic acids are NOT DETECTED.  The SARS-CoV-2 RNA is generally detectable in upper and lower respiratory specimens during the acute phase of infection. The lowest concentration of SARS-CoV-2 viral copies this assay can detect is 250 copies / mL. A negative result does not preclude SARS-CoV-2 infection and should not be used as the sole basis for treatment or other patient management decisions.  A negative result may occur with improper specimen collection / handling, submission of specimen other than nasopharyngeal swab, presence of viral  mutation(s) within the areas targeted by this assay, and inadequate number of viral copies (<250 copies / mL). A negative result must be combined with clinical observations, patient history, and epidemiological information.  Fact Sheet for Patients:   StrictlyIdeas.no  Fact Sheet for Healthcare Providers: BankingDealers.co.za  This test is not yet approved or  cleared by the [place] FDA and has been authorized for detection and/or diagnosis of SARS-CoV-2 by FDA under an Emergency Use Authorization (EUA).  This EUA will remain in effect (meaning this test can be used) for the duration of the COVID-19 declaration under Section 564(b)(1) of the Act, 21 U.S.C. section 360bbb-3(b)(1), unless the authorization is terminated or revoked sooner.  Performed at [facility], [place] [address], [place], [place] [address]       Studies: DG Chest 2 View  Result Date: 06/15/2020 CLINICAL DATA:  Chest pain. EXAM: CHEST - 2 VIEW COMPARISON:  CT chest and chest x-ray from yesterday. FINDINGS: The heart size and mediastinal contours are within normal limits. Normal pulmonary vascularity. Bilateral calcified pleural plaques again noted. No focal consolidation, pleural effusion, or pneumothorax. Multiple thoracic compression deformities again noted. Oral contrast within the visualized bowel. IMPRESSION: 1. No acute cardiopulmonary disease. Electronically Signed   By: Titus Dubin M.D.   On: 06/15/2020 17:35    Scheduled Meds: . [START ON 06/17/2020] aspirin EC  81 mg Oral QHS  . darifenacin  15 mg Oral Daily  . docusate sodium  100 mg Oral BID  . donepezil  10 mg Oral QHS  . DULoxetine  90 mg Oral Daily  . enoxaparin (LOVENOX) injection  40 mg Subcutaneous Q24H  . fluticasone  2 spray Each Nare Daily  . isosorbide mononitrate  30 mg Oral QPM  . isosorbide mononitrate  60 mg Oral Daily  . lamoTRIgine  150 mg Oral Daily  .  methylPREDNISolone (SOLU-MEDROL) injection  40 mg Intravenous Q6H  . metoprolol succinate  50 mg Oral Daily  . pantoprazole  40 mg Oral BID AC  . rosuvastatin  20 mg Oral Daily  . traZODone  150 mg Oral QHS    Continuous Infusions:   LOS: 1 day     Alma Friendly, MD [facility]  If 7PM-7AM, please contact night-coverage [email or web address] 06/16/2020, 2:28 PM

## 2020-06-16 NOTE — ED Notes (Signed)
Patient to endo at 11:30am

## 2020-06-16 NOTE — H&P (View-Only) (Signed)
Referring Provider:  [facility]         Primary Care Physician:  Tammi Sou, MD Primary Gastroenterologist:  unassigned           We were asked to see this patient for:  choking                ASSESSMENT /  PLAN    Linda [name] is a 74 y.o. female PMH significant for, but not necessarily limited to, OSA, asthma,  pernicious anemia, PE, RBBB, HTN, chronic diastolic heart failure, fibromyalgia, anxiety, idiopathic angioedema urticaria, appendectomy, fibromyalgia.                                                                                                                                   # Solid food dysphagia --Chronic intermittent. MBSS suggests oropharyngeal dysphagia with decreased relation of UES.  --We discussed EGD, perhaps we could dilated UES, will discuss with Dr. Havery Moros but patient is somewhat reluctant given need for sedation.     HPI:    Chief Complaint: choking.   Linda [name] is a 74 y.o. female who was admitted 06/14/2020 after choking on a piece of chicken.  A bystander did the Heimlich maneuver without results.  Patient stuck her finger down her throat and pulled out a small amount of chicken.  Ultimately she was unable to breathe or talk so EMS was called.  Seen by ENT in ED, got a bedside scope with no findings of foreign body/food.   PCCM was asked to see and evaluate for possible lower airway obstruction.  Chest CT scan did not show any obstructing foreign bodies.  Due to ongoing stridor she was kept overnight for consideration of bronchoscopy.  Inpatient MBSS revealed mild cervical esophageal dysphagia / decreased relaxation of cricopharyngeus muscle.    One instance of frank flash penetration when liquid hit closed UES.. Mechanical soft and thin liquids recommended. The PCCM did not feel bronchoscopy was indicated with improving stridor, patient was discharged home.  She would follow-up in the pulmonary clinic for evaluation of  asbestosis.  After discharge yesterday she started having chest pain, took 3 NTG. Daughter in law wanted to take her back to hospital so readmitted. PCCM had seen and scheduled bronchoscopy for today but patient declined. Apparently she has concerns about sedation.   Linda [name] gives a history of chronic intermittent solid food dysphagia but she also sometimes 'chokes" on liquids. No other GI complaints. Reports colonoscopy a few years ago in [place], ? Polyps removed.   PREVIOUS ENDOSCOPIC EVALUATIONS / GI STUDIES :  2015 EGD for melena Gastritis in the gastric body 2. Nodule in the gastric antrum; multiple biopsies  Biopsies : Stomach, biopsy, antral nodule - REACTIVE GASTROPATHY (ANTRAL MUCOSA) SEE COMMENT. - WARTHIN STARRY STAIN NEGATIVE FOR H. PYLORI - NEGATIVE FOR INTESTINAL METAPLASIA, DYSPLASIA, OR MALIGNANCY  Past Medical History:  Diagnosis Date  . Acute  upper GI bleed 06/2014   while pt taking coumadin, plavix, and meloxicam---despite being told not to take coumadin.  . Anginal pain ([place])    Nonobstructive CAD 2014; however, her cardiologist put her on a statin for this and NOT for hyperlipidemia per pt report.  Atyp CP 08/2017 at card f/u, plan for myoc perf imaging.  . Anxiety    panic attacks  . Asthma    w/ asbestososis   . BPPV (benign paroxysmal positional vertigo) 12/16/2012  . Chronic diastolic CHF (congestive heart failure) (HCC)    dry wt as of 11/06/16 is 168 lbs.  . Chronic lower back pain   . COPD (chronic obstructive pulmonary disease) ([place])   . DDD (degenerative disc disease)    lumbar and cervical.   . Diverticular disease   . Fibromyalgia    Patient states dx was around her late 26s but she had sx's for years prior to this.  . H/O hiatal hernia   . History of pneumonia    hospitalized 12/2011, 02/2013, and 07/2013 [facility]) for this  . HTN (hypertension)    Renal artery dopplers 04/2013 neg for stenosis.  . Hypervitaminosis D 09/27/2019    over-supplemented.  Stopped vit D and plan recheck 2 mo.  . Idiopathic angio-edema-urticaria [id number]; 2021   Angioedema component was very minimal.  2021->Dr. Bobbitt (allergist) eval.  . Insomnia   . Iron deficiency anemia    Hematologist in [place], [place] did extensive w/u; no cause found; failed oral supplement;; gets fairly regular (q55m or so) IV iron infusions (Venofer -iron sucrose- 200mg  with procrit.  "for 14 yr I've been getting blood work q month & getting infusions prn" (07/12/2013).  Dr. Marin Olp locally, iron infusions done, EPO deficiency dx'd  . Migraine syndrome    "not as often anymore; used to be ~ q wk" (07/12/2013)  . Mixed incontinence urge and stress   . Nephrolithiasis    "passed all on my own or they are still in there" (07/12/2013)  . Neuroleptic induced Parkinsonism ([place]) 2018   Dr. Carles Collet, neuro, saw her 11/24/17 and recommended d/c of abilify as first step.  D/c'd abilify and pt got complete recovery.  . OSA on CPAP    prior to move to [place]--had another sleep study 10/2015 w/pulm Dr. Camillo Flaming.  . Osteoarthritis    "severe; progressing fast" (07/12/2013); multiple joints-not surgical candidate for TKR (03/2015).  Triamcinolon knee injections by Dr. Tessa Lerner 12/2017.  [name] Kitchen Pernicious anemia 08/24/2014  . Pleural plaque with presence of asbestos 07/22/2013  . Pulmonary embolism ([place]) 07/2013   Dx at [facility] with very small peripheral upper lobe pe 07/2013: pt took coumadin for about 8-9 mo  . Pyelonephritis    "several times over the last 30 yr" (07/12/2013)  . RBBB (right bundle branch block)   . Recurrent major depression ([place])   . Recurrent UTI    hx of hospitalization for pyelonephritis; started abx prophylaxis 06/2015  . Syncope    Hypotensive; ED visit--Dr. Terrence [place] did Cath--nonobstructive CAD, EF 55-60%.  In retrospect, suspect pt rx med misuse/polypharmacy    Past Surgical History:  Procedure Laterality Date  . APPENDECTOMY  1960  . AXILLARY SURGERY Left 1978   Multiple "lump" in  armpit per pt  . CARDIAC CATHETERIZATION  01/2013   nonobstructive CAD, EF 55-60%  . CARDIOVASCULAR STRESS TEST  02/22/15   Low risk myocard perf imaging; wall motion normal, normal EF  . carotid duplex doppler  10/21/2017   R vertebral flow  suggestive of possible distal obstruction.  Pt declined further w/u as of 10/29/17 but need to revisit this problem periodically.  . COCCYX REMOVAL  1972  . DEXA  06/05/2017   T-score -3.1  . DILATION AND CURETTAGE OF UTERUS  ? 1970's  . ESOPHAGOGASTRODUODENOSCOPY N/A 07/19/2014   Gastritis found + in the setting of supratherapeutic INR, +plavix, + meloxicam.  . EYE SURGERY Left 2012-2013   "injections for ~ 1 yr; don't really know what for" (07/12/2013)  . HEEL SPUR SURGERY Left 2008  . KNEE SURGERY  2005  . LEFT HEART CATHETERIZATION WITH CORONARY ANGIOGRAM N/A 01/30/2013   Procedure: LEFT HEART CATHETERIZATION WITH CORONARY ANGIOGRAM;  Surgeon: Clent Demark, MD;  Location: [facility] CATH LAB;  Service: Cardiovascular;  Laterality: N/A;  . PLANTAR FASCIA RELEASE Left 2008  . SPIROMETRY  04/25/14   In hosp for acute asthma/COPD flare: mixed obstructive and restrictive lung disease. The FEV1 is severely reduced at 45% predicted.  FEV1 signif decreased compared to prior spirometry 07/23/13.  . TENDON RELEASE  1996   Right forearm and hand  . TOTAL ABDOMINAL HYSTERECTOMY  1974  . TRANSTHORACIC ECHOCARDIOGRAM  01/2013; 04/2014;08/2015; 09/2017   2014--NORMAL.  2015--focal basal septal hypertrophy, EF 55-60%, grade I diast dysfxn, mild LAE.  08/2015 EF 55-60%, nl LV syst fxn, grade I DD, valves wnl. 10/21/17: EF 65-70%, grd I DD, o/w normal.    Prior to Admission medications   Medication Sig Start Date End Date Taking? Authorizing Provider  albuterol (VENTOLIN HFA) 108 (90 Base) MCG/ACT inhaler Inhale 1-2 puffs into the lungs every 6 (six) hours as needed for wheezing or shortness of breath. 12/06/19   McGowen, Adrian Blackwater, MD  alendronate (FOSAMAX) 70 MG tablet Take 1  tablet (70 mg total) by mouth every 7 (seven) days. Take with a full glass of water on an empty stomach, first thing in the morning and remain up right for 30 minutes after taking. 07/26/19   McGowen, Adrian Blackwater, MD  ALPRAZolam (XANAX) 1 MG tablet TAKE 1 TABLET THREE TIMES DAILY AS NEEDED FOR ANXIETY. Patient taking differently: Take 1 mg by mouth 3 (three) times daily as needed for anxiety or sleep.  05/29/20   McGowen, Adrian Blackwater, MD  aspirin 81 MG tablet Take 81 mg by mouth at bedtime.    [provider]  cyclobenzaprine (FLEXERIL) 5 MG tablet Take 1 tablet (5 mg total) by mouth 3 (three) times daily as needed for muscle spasms. 05/24/20   Meredith Staggers, MD  donepezil (ARICEPT) 10 MG tablet Take 1 tablet (10 mg total) by mouth at bedtime. 01/31/20   McGowen, Adrian Blackwater, MD  DULoxetine (CYMBALTA) 30 MG capsule TAKE 1 CAPSULE ONCE DAILY ALONG WITH 60MG  CAPSULE FOR TOTAL 90MG  DAILY. Patient taking differently: Take by mouth daily. Along with 60 mg capsule for total 90 mg. 04/17/20   McGowen, Adrian Blackwater, MD  DULoxetine (CYMBALTA) 60 MG capsule TAKE 1 CAPSULE A DAY TO BE COMBINED WITH 30MG  CAPSULE Patient taking differently: Take 60 mg by mouth daily. Take with 30mg  to total 90mg  daily. 07/27/19   McGowen, Adrian Blackwater, MD  EPINEPHrine 0.3 mg/0.3 mL IJ SOAJ injection Inject 0.3 mLs (0.3 mg total) into the muscle as needed for anaphylaxis. 01/22/20   Geradine Girt, DO  fluconazole (DIFLUCAN) 100 MG tablet TAKE (1) TABLET DAILY AS NEEDED FOR THRUSH Patient taking differently: Take 100 mg by mouth daily as needed (for thrush).  05/03/20   McGowen, Adrian Blackwater, MD  fluticasone (FLONASE) 50 MCG/ACT nasal spray Place 2 sprays into both nostrils daily. 09/27/19   McGowen, Adrian Blackwater, MD  furosemide (LASIX) 40 MG tablet Take 1 tablet (40 mg total) by mouth daily as needed for fluid. 10/07/19   Cherylann Ratel A, DO  ipratropium (ATROVENT) 0.03 % nasal spray Place 2 sprays into both nostrils every 12 (twelve) hours. 09/27/19    McGowen, Adrian Blackwater, MD  isosorbide mononitrate (IMDUR) 30 MG 24 hr tablet TAKE 2 TABLETS IN THE MORNING AND 1 TABLET IN THE EVENING. Patient taking differently: Take 30 mg by mouth See admin instructions. Takes 2 tablets by mouth in the morning and 1 tablet in the evening. 05/24/20   Lelon Perla, MD  lamoTRIgine (LAMICTAL) 150 MG tablet TAKE 1 TABLET EACH DAY. Patient taking differently: Take 150 mg by mouth daily.  11/22/19   McGowen, Adrian Blackwater, MD  metoprolol succinate (TOPROL-XL) 50 MG 24 hr tablet TAKE (1) TABLET DAILY IN THE MORNING. Patient taking differently: Take 50 mg by mouth daily.  07/27/19   McGowen, Adrian Blackwater, MD  Multiple Vitamin (MULTIVITAMIN WITH MINERALS) TABS tablet Take 1 tablet by mouth daily.    [provider]  nitroGLYCERIN (NITROSTAT) 0.4 MG SL tablet Place 1 tablet (0.4 mg total) under the tongue every 5 (five) minutes as needed for chest pain (x 3 doses). 09/23/19   Lelon Perla, MD  ondansetron (ZOFRAN) 8 MG tablet Take 1 tablet (8 mg total) by mouth every 8 (eight) hours as needed for nausea or vomiting. 10/22/19   McGowen, Adrian Blackwater, MD  oxyCODONE-acetaminophen (PERCOCET) 10-325 MG tablet Every 6 hours as needed Patient taking differently: Take 1 tablet by mouth every 6 (six) hours as needed for pain. Every 6 hours as needed 05/24/20   Meredith Staggers, MD  pantoprazole (PROTONIX) 40 MG tablet TAKE 1 TABLET BY MOUTH DAILY. Patient taking differently: Take 40 mg by mouth daily.  12/10/19   McGowen, Adrian Blackwater, MD  rosuvastatin (CRESTOR) 20 MG tablet Take 1 tablet (20 mg total) by mouth daily. 09/23/19 02/28/28  Lelon Perla, MD  solifenacin (VESICARE) 10 MG tablet Take 1 tablet (10 mg total) by mouth daily. 04/28/20   McGowen, Adrian Blackwater, MD  traZODone (DESYREL) 50 MG tablet TAKE 2-4 TABLETS AT BEDTIME Patient taking differently: Take 150 mg by mouth at bedtime.  12/10/19   McGowen, Adrian Blackwater, MD    Current Facility-Administered Medications  Medication Dose  Route Frequency Provider Last Rate Last Admin  . acetaminophen (TYLENOL) tablet 650 mg  650 mg Oral Q6H PRN Lavina Hamman, MD       Or  . acetaminophen (TYLENOL) suppository 650 mg  650 mg Rectal Q6H PRN Lavina Hamman, MD      . ALPRAZolam Duanne Moron) tablet 0.5 mg  0.5 mg Oral TID PRN Lavina Hamman, MD   0.5 mg at 06/16/20 1326  . [START ON 06/17/2020] aspirin EC tablet 81 mg  81 mg Oral QHS Lavina Hamman, MD      . darifenacin (ENABLEX) 24 hr tablet 15 mg  15 mg Oral Daily Lavina Hamman, MD      . docusate sodium (COLACE) capsule 100 mg  100 mg Oral BID Lavina Hamman, MD   100 mg at 06/16/20 1318  . donepezil (ARICEPT) tablet 10 mg  10 mg Oral QHS Lavina Hamman, MD   10 mg at 06/15/20 2137  . DULoxetine (CYMBALTA) DR capsule 90 mg  90  mg Oral Daily Lavina Hamman, MD   90 mg at 06/15/20 2138  . enoxaparin (LOVENOX) injection 40 mg  40 mg Subcutaneous Q24H Lavina Hamman, MD   40 mg at 06/16/20 1319  . fluticasone (FLONASE) 50 MCG/ACT nasal spray 2 spray  2 spray Each Nare Daily Lavina Hamman, MD      . hydrALAZINE (APRESOLINE) tablet 25 mg  25 mg Oral Q6H PRN Lavina Hamman, MD      . ipratropium-albuterol (DUONEB) 0.5-2.5 (3) MG/3ML nebulizer solution 3 mL  3 mL Nebulization Q4H PRN Alma Friendly, MD      . isosorbide mononitrate (IMDUR) 24 hr tablet 30 mg  30 mg Oral QPM Lavina Hamman, MD   30 mg at 06/15/20 2101  . isosorbide mononitrate (IMDUR) 24 hr tablet 60 mg  60 mg Oral Daily Lavina Hamman, MD      . lamoTRIgine (LAMICTAL) tablet 150 mg  150 mg Oral Daily Lavina Hamman, MD   150 mg at 06/16/20 1318  . methylPREDNISolone sodium succinate (SOLU-MEDROL) 40 mg/mL injection 40 mg  40 mg Intravenous Q6H Mannam, Praveen, MD   40 mg at 06/16/20 1319  . metoprolol succinate (TOPROL-XL) 24 hr tablet 50 mg  50 mg Oral Daily Lavina Hamman, MD   50 mg at 06/16/20 1317  . ondansetron (ZOFRAN) tablet 4 mg  4 mg Oral Q6H PRN Lavina Hamman, MD       Or  . ondansetron  [facility]) injection 4 mg  4 mg Intravenous Q6H PRN Lavina Hamman, MD      . oxyCODONE-acetaminophen (PERCOCET/ROXICET) 5-325 MG per tablet 1 tablet  1 tablet Oral Q6H PRN Lavina Hamman, MD   1 tablet at 06/16/20 1325   And  . oxyCODONE (Oxy IR/ROXICODONE) immediate release tablet 5 mg  5 mg Oral Q6H PRN Lavina Hamman, MD   5 mg at 06/16/20 1325  . pantoprazole (PROTONIX) EC tablet 40 mg  40 mg Oral BID AC Lavina Hamman, MD   40 mg at 06/16/20 0730  . Racepinephrine HCl 2.25 % nebulizer solution 0.5 mL  0.5 mL Nebulization Q2H PRN Lavina Hamman, MD      . rosuvastatin (CRESTOR) tablet 20 mg  20 mg Oral Daily Lavina Hamman, MD      . traZODone (DESYREL) tablet 150 mg  150 mg Oral QHS Lavina Hamman, MD   150 mg at 06/15/20 2131    Allergies as of 06/15/2020 - Review Complete 06/15/2020  Allergen Reaction Noted  . Abilify [aripiprazole] Other (See Comments) 11/27/2017  . Penicillins Itching, Swelling, and Rash 10/16/2012    Family History  Problem Relation Age of Onset  . Arthritis Mother   . Kidney disease Mother   . Heart disease Father   . Stroke Father   . Hypertension Father   . Diabetes Father   . Heart attack Father   . Heart attack Paternal Grandmother   . Diabetes Sister        one sister  . Hypertension Sister   . Asthma Sister   . Hypertension Brother   . Asthma Brother   . Asthma Daughter   . Multiple sclerosis Son     Social History   Socioeconomic History  . Marital status: Widowed    Spouse name: Not on file  . Number of children: 2  . Years of education: Not on file  . Highest education level: Not on  file  Occupational History  . 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 Smoking status: Never Smoker  . Smokeless tobacco: Never Used  . Tobacco comment: never used tobacco  Vaping Use  . Vaping Use: Never used  Substance and Sexual Activity  . Alcohol use: No    Alcohol/week: 0.0 standard drinks  . Drug use: No   . Sexual activity: Not Currently  Other Topics Concern  . Not on file  Social History Narrative   Widowed, 2 sons.  Relocated to [place] 09/2012 to be closer to her son who has Linda.   Husband d 2015--mesothelioma.   Occupation: former Pharmacist, hospital.   Education: masters degree level.   No T/A/Ds.   Lives in [place], independent living.   Social Determinants of Health   Financial Resource Strain:   . Difficulty of Paying Living Expenses:   Food Insecurity:   . Worried About Charity fundraiser in the Last Year:   . Arboriculturist in the Last Year:   Transportation Needs:   . Film/video editor (Medical):   [name] Kitchen Lack of Transportation (Non-Medical):   Physical Activity:   . Days of Exercise per Week:   . Minutes of Exercise per Session:   Stress:   . Feeling of Stress :   Social Connections:   . Frequency of Communication with Friends and Family:   . Frequency of Social Gatherings with Friends and Family:   . Attends Religious Services:   . Active Member of Clubs or Organizations:   . Attends Archivist Meetings:   [name] Kitchen Marital Status:   Intimate Partner Violence:   . Fear of Current or Ex-Partner:   . Emotionally Abused:   [name] Kitchen Physically Abused:   . Sexually Abused:     Review of Systems: All systems reviewed and negative except where noted in HPI.  Physical Exam: Vital signs in last 24 hours: Temp:  [98.2 F (36.8 C)-98.6 F (37 C)] 98.6 F (37 C) (06/25 1432) Pulse Rate:  [72-97] 77 (06/25 1432) Resp:  [11-26] 15 (06/25 1432) BP: (96-203)/(67-138) 168/80 (06/25 1432) SpO2:  [93 %-100 %] 100 % (06/25 1432)   General:   Alert, well-developed, female in NAD Psych:  Pleasant, cooperative. Normal mood and affect. Eyes:  Pupils equal, sclera clear, no icterus.   Conjunctiva pink. Ears:  Normal auditory acuity. Nose:  No deformity, discharge,  or lesions. Neck:  Supple; no masses Lungs:  Clear throughout to auscultation.   No wheezes, crackles, or rhonchi.   Heart:  Regular rate and rhythm; no murmurs, no lower extremity edema Abdomen:  Soft, non-distended, nontender, BS active, no palp mass   Rectal:  Deferred  Msk:  Symmetrical without gross deformities. . Neurologic:  Alert and  oriented x4;  grossly normal neurologically. Skin:  Intact without significant lesions or rashes.   Intake/Output from previous day: No intake/output data recorded. Intake/Output this shift: No intake/output data recorded.  Lab Results: Recent Labs    06/15/20 0327 06/15/20 1710 06/16/20 0442  WBC 7.9 13.8* 9.9  HGB 13.6 14.6 15.2*  HCT 42.6 45.7 46.4*  PLT 191 215 203   BMET Recent Labs    06/15/20 0327 06/15/20 1710 06/16/20 0442  NA 137 135 138  K 4.3 4.1 4.0  CL 103 101 101  CO2 24 27 27   GLUCOSE 132* 108* 147*  BUN 17 20 21   CREATININE 0.75 0.82 0.72  CALCIUM 8.7*  8.8* 9.1   LFT Recent Labs    06/16/20 0442  PROT 7.6  ALBUMIN 4.2  AST 25  ALT 26  ALKPHOS 61  BILITOT 0.5   PT/INR Recent Labs    06/16/20 0442  LABPROT 12.8  INR 1.0   Hepatitis Panel No results for input(s): HEPBSAG, HCVAB, HEPAIGM, HEPBIGM in the last 72 hours.   . CBC Latest Ref Rng & Units 06/16/2020 06/15/2020 06/15/2020  WBC 4.0 - 10.5 K/uL 9.9 13.8(H) 7.9  Hemoglobin 12.0 - 15.0 g/dL 15.2(H) 14.6 13.6  Hematocrit 36 - 46 % 46.4(H) 45.7 42.6  Platelets 150 - 400 K/uL 203 215 191    . CMP Latest Ref Rng & Units 06/16/2020 06/15/2020 06/15/2020  Glucose 70 - 99 mg/dL 147(H) 108(H) 132(H)  BUN 8 - 23 mg/dL 21 20 17   Creatinine 0.44 - 1.00 mg/dL 0.72 0.82 0.75  Sodium 135 - 145 mmol/L 138 135 137  Potassium 3.5 - 5.1 mmol/L 4.0 4.1 4.3  Chloride 98 - 111 mmol/L 101 101 103  CO2 22 - 32 mmol/L 27 27 24   Calcium 8.9 - 10.3 mg/dL 9.1 8.8(L) 8.7(L)  Total Protein 6.5 - 8.1 g/dL 7.6 - -  Total Bilirubin 0.3 - 1.2 mg/dL 0.5 - -  Alkaline Phos 38 - 126 U/L 61 - -  AST 15 - 41 U/L 25 - -  ALT 0 - 44 U/L 26 - -   Studies/Results: DG Chest 2  View  Result Date: 06/15/2020 CLINICAL DATA:  Chest pain. EXAM: CHEST - 2 VIEW COMPARISON:  CT chest and chest x-ray from yesterday. FINDINGS: The heart size and mediastinal contours are within normal limits. Normal pulmonary vascularity. Bilateral calcified pleural plaques again noted. No focal consolidation, pleural effusion, or pneumothorax. Multiple thoracic compression deformities again noted. Oral contrast within the visualized bowel. IMPRESSION: 1. No acute cardiopulmonary disease. Electronically Signed   By: Titus Dubin M.D.   On: 06/15/2020 17:35   CT CHEST WO CONTRAST  Result Date: 06/14/2020 CLINICAL DATA:  74 year old female with history of aspiration. EXAM: CT CHEST WITHOUT CONTRAST TECHNIQUE: Multidetector CT imaging of the chest was performed following the standard protocol without IV contrast. COMPARISON:  Chest CT 01/01/2016. FINDINGS: Cardiovascular: Heart size is normal. There is no significant pericardial fluid, thickening or pericardial calcification. There is aortic atherosclerosis, as well as atherosclerosis of the great vessels of the mediastinum and the coronary arteries, including calcified atherosclerotic plaque in the left main, left anterior descending, left circumflex and right coronary arteries. Mediastinum/Nodes: No pathologically enlarged mediastinal or hilar lymph nodes. Please note that accurate exclusion of hilar adenopathy is limited on noncontrast CT scans. Esophagus is unremarkable in appearance. No axillary lymphadenopathy. Lungs/Pleura: Numerous calcified pleural plaques bilaterally, indicative of asbestos related pleural disease. No pleural effusions. No suspicious appearing pulmonary nodules or masses are noted. No acute consolidative airspace disease. Some peripheral predominant areas of ground-glass attenuation and parenchymal banding throughout the mid to lower lungs bilaterally. No honeycombing. Upper Abdomen: Aortic atherosclerosis. Musculoskeletal: New  compression fractures of T7 and T9, most severe at T9 with 40% loss of anterior vertebral body height. There are no aggressive appearing lytic or blastic lesions noted in the visualized portions of the skeleton. IMPRESSION: 1. No imaging findings to suggest significant aspiration. 2. Asbestos related pleural disease with imaging findings in the lung bases bilaterally concerning for developing interstitial lung disease such as asbestosis. Outpatient referral to Pulmonology for appropriate follow-up is recommended. 3. Aortic atherosclerosis, in addition to left main  and 3 vessel coronary artery disease. Assessment for potential risk factor modification, dietary therapy or pharmacologic therapy may be warranted, if clinically indicated. 4. New compression fractures of T7 and T9, most severe at T9 where there is 40% loss of anterior vertebral body height. Aortic Atherosclerosis (ICD10-I70.0). Electronically Signed   By: Vinnie Langton M.D.   On: 06/14/2020 16:17   DG Swallowing Func-Speech Pathology  Result Date: 06/16/2020 Objective Swallowing Evaluation: Type of Study: MBS-Modified Barium Swallow Study  Patient Details Name: Linda [name] MRN: [id number] Date of Birth: 05/09/1946 Today's Date: 06/16/2020 Time: SLP Start Time (ACUTE ONLY): 1326 -SLP Stop Time (ACUTE ONLY): 1340 SLP Time Calculation (min) (ACUTE ONLY): 14 min Past Medical History: Past Medical History: Diagnosis Date . Acute upper GI bleed 06/2014  while pt taking coumadin, plavix, and meloxicam---despite being told not to take coumadin. . Anginal pain ([place])   Nonobstructive CAD 2014; however, her cardiologist put her on a statin for this and NOT for hyperlipidemia per pt report.  Atyp CP 08/2017 at card f/u, plan for myoc perf imaging. . Anxiety   panic attacks . Asthma   w/ asbestososis  . BPPV (benign paroxysmal positional vertigo) 12/16/2012 . Chronic diastolic CHF (congestive heart failure) (HCC)   dry wt as of 11/06/16 is 168 lbs. . Chronic  lower back pain  . COPD (chronic obstructive pulmonary disease) ([place])  . DDD (degenerative disc disease)   lumbar and cervical.  . Diverticular disease  . Fibromyalgia   Patient states dx was around her late 65s but she had sx's for years prior to this. . H/O hiatal hernia  . History of pneumonia   hospitalized 12/2011, 02/2013, and 07/2013 [facility]) for this . HTN (hypertension)   Renal artery dopplers 04/2013 neg for stenosis. . Hypervitaminosis D 09/27/2019  over-supplemented.  Stopped vit D and plan recheck 2 mo. . Idiopathic angio-edema-urticaria [id number]; 2021  Angioedema component was very minimal.  2021->Dr. [place] (allergist) eval. . Insomnia  . Iron deficiency anemia   Hematologist in [place], [place] did extensive w/u; no cause found; failed oral supplement;; gets fairly regular (q65m or so) IV iron infusions (Venofer -iron sucrose- 200mg  with procrit.  "for 14 yr I've been getting blood work q month & getting infusions prn" (07/12/2013).  Dr. Marin Olp locally, iron infusions done, EPO deficiency dx'd . Migraine syndrome   "not as often anymore; used to be ~ q wk" (07/12/2013) . Mixed incontinence urge and stress  . Nephrolithiasis   "passed all on my own or they are still in there" (07/12/2013) . 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 Carles Collet, neuro, saw her 11/24/17 and recommended d/c of abilify as first step.  D/c'd abilify and pt got complete recovery. . OSA on CPAP   prior to move to [place]--had another sleep study 10/2015 w/pulm Dr. Camillo Flaming. . Osteoarthritis   "severe; progressing fast" (07/12/2013); multiple joints-not surgical candidate for TKR (03/2015).  Triamcinolon knee injections by Dr. Tessa Lerner 12/2017. [name] Kitchen Pernicious anemia 08/24/2014 . Pleural plaque with presence of asbestos 07/22/2013 . Pulmonary embolism ([place]) 07/2013  Dx at [facility] with very small peripheral upper lobe pe 07/2013: pt took coumadin for about 8-9 mo . Pyelonephritis   "several times over the last 30 yr" (07/12/2013) . RBBB (right bundle branch block)   . Recurrent major depression ([place])  . Recurrent UTI   hx of hospitalization for pyelonephritis; started abx prophylaxis 06/2015 . Syncope   Hypotensive; ED visit--Dr. Terrence Dupont did Cath--nonobstructive CAD, EF 55-60%.  In retrospect,  suspect pt rx med misuse/polypharmacy Past Surgical History: Past Surgical History: Procedure Laterality Date . APPENDECTOMY  1960 . AXILLARY SURGERY Left 1978  Multiple "lump" in armpit per pt . CARDIAC CATHETERIZATION  01/2013  nonobstructive CAD, EF 55-60% . CARDIOVASCULAR STRESS TEST  02/22/15  Low risk myocard perf imaging; wall motion normal, normal EF . carotid duplex doppler  10/21/2017  R vertebral flow suggestive of possible distal obstruction.  Pt declined further w/u as of 10/29/17 but need to revisit this problem periodically. . COCCYX REMOVAL  1972 . DEXA  06/05/2017  T-score -3.1 . DILATION AND CURETTAGE OF UTERUS  ? 1970's . ESOPHAGOGASTRODUODENOSCOPY N/A 07/19/2014  Gastritis found + in the setting of supratherapeutic INR, +plavix, + meloxicam. . EYE SURGERY Left 2012-2013  "injections for ~ 1 yr; don't really know what for" (07/12/2013) . HEEL SPUR SURGERY Left 2008 . KNEE SURGERY  2005 . LEFT HEART CATHETERIZATION WITH CORONARY ANGIOGRAM N/A 01/30/2013  Procedure: LEFT HEART CATHETERIZATION WITH CORONARY ANGIOGRAM;  Surgeon: Clent Demark, MD;  Location[facility] CATH LAB;  Service: Cardiovascular;  Laterality: N/A; . PLANTAR FASCIA RELEASE Left 2008 . SPIROMETRY  04/25/14  In hosp for acute asthma/COPD flare: mixed obstructive and restrictive lung disease. The FEV1 is severely reduced at 45% predicted.  FEV1 signif decreased compared to prior spirometry 07/23/13. . TENDON RELEASE  1996  Right forearm and hand . TOTAL ABDOMINAL HYSTERECTOMY  1974 . TRANSTHORACIC ECHOCARDIOGRAM  01/2013; 04/2014;08/2015; 09/2017  2014--NORMAL.  2015--focal basal septal hypertrophy, EF 55-60%, grade I diast dysfxn, mild LAE.  08/2015 EF 55-60%, nl LV syst fxn, grade I DD, valves wnl. 10/21/17: EF 65-70%, grd  I DD, o/w normal. HPI: 74 y/o F who [facility] ER [facility]/23 after a choking episode requiring abdominal thrusts.  Concern for retained chicken nugget.  ENT finding negative for Upper airway obstruction, pulmonology reports pts stridor has resolved and bronch not needed.  medical history significant of idiopathic angioedema, nonobstructive CAD, chronic diastolic congestive heart failure, COPD on 3 L home oxygen, PE, neuroleptic induced parkinsonism, hypertension, fibromyalgia, anxiety, asthma. Was seen early in 2021 for idiopathin angioedema of lips tongue causing some acute dysphagia which resolved quickly. Pt able to consume regular solids by the time of d/c. Upper endoscopy in 2015 showed some gastritis, but no stricture.  No data recorded Assessment / Plan / Recommendation CHL IP CLINICAL IMPRESSIONS 06/15/2020 Clinical Impression Pt demonstrates a mild cervical esophageal dysphagia with appearance of decreased relaxation of cricopharyngeus muscle. Pt had one instance of traace frank flash penetration when liquid hit closed UES and was propelled into pharynx. Pt had trace residue post swallwo that she sensed and cleared with a second swallow. In general her behavior and function was much more controlled during exam than in the room. Esophageal sweep revealed open esophageal colum and distal residue, no radiologist present to confirm. Pt may be at ongoing risk of food lodging in the cervical esophagus. Recommend pt avoid meats for a time. She is otherwise safe to continue with thin liquids and soft chewable solids. Discussed with pt. May need to f/u with GI.  SLP Visit Diagnosis Dysphagia, oropharyngeal phase (R13.12) Attention and concentration deficit following -- Frontal lobe and executive function deficit following -- Impact on safety and function Moderate aspiration risk   CHL IP TREATMENT RECOMMENDATION 06/15/2020 Treatment Recommendations Therapy as outlined in treatment plan below   Prognosis 01/21/2020  Prognosis for Safe Diet Advancement Good Barriers to Reach Goals -- Barriers/Prognosis Comment -- CHL IP  DIET RECOMMENDATION 06/15/2020 SLP Diet Recommendations Thin liquid;Dysphagia 3 (Mech soft) solids Liquid Administration via Cup;Straw Medication Administration Crushed with puree Compensations Slow rate;Small sips/bites Postural Changes Remain semi-upright after after feeds/meals (Comment);Seated upright at 90 degrees   CHL IP OTHER RECOMMENDATIONS 06/15/2020 Recommended Consults Consider GI evaluation Oral Care Recommendations Oral care BID Other Recommendations --   CHL IP FOLLOW UP RECOMMENDATIONS 06/15/2020 Follow up Recommendations Home health SLP   CHL IP FREQUENCY AND DURATION 01/21/2020 Speech Therapy Frequency (ACUTE ONLY) min 2x/week Treatment Duration --      CHL IP ORAL PHASE 06/15/2020 Oral Phase WFL Oral - Pudding Teaspoon -- Oral - Pudding Cup -- Oral - Honey Teaspoon -- Oral - Honey Cup -- Oral - Nectar Teaspoon -- Oral - Nectar Cup -- Oral - Nectar Straw -- Oral - Thin Teaspoon -- Oral - Thin Cup -- Oral - Thin Straw -- Oral - Puree -- Oral - Mech Soft -- Oral - Regular -- Oral - Multi-Consistency -- Oral - Pill -- Oral Phase - Comment --  CHL IP PHARYNGEAL PHASE 06/15/2020 Pharyngeal Phase WFL;Impaired Pharyngeal- Pudding Teaspoon -- Pharyngeal -- Pharyngeal- Pudding Cup -- Pharyngeal -- Pharyngeal- Honey Teaspoon -- Pharyngeal -- Pharyngeal- Honey Cup -- Pharyngeal -- Pharyngeal- Nectar Teaspoon -- Pharyngeal -- Pharyngeal- Nectar Cup -- Pharyngeal -- Pharyngeal- Nectar Straw -- Pharyngeal -- Pharyngeal- Thin Teaspoon -- Pharyngeal -- Pharyngeal- Thin Cup Penetration/Apiration after swallow;Trace aspiration Pharyngeal Material enters airway, CONTACTS cords and then ejected out;Material does not enter airway Pharyngeal- Thin Straw -- Pharyngeal -- Pharyngeal- Puree WFL Pharyngeal -- Pharyngeal- Mechanical Soft -- Pharyngeal -- Pharyngeal- Regular WFL Pharyngeal -- Pharyngeal- Multi-consistency --  Pharyngeal -- Pharyngeal- Pill -- Pharyngeal -- Pharyngeal Comment --  CHL IP CERVICAL ESOPHAGEAL PHASE 06/15/2020 Cervical Esophageal Phase Impaired Pudding Teaspoon -- Pudding Cup -- Honey Teaspoon -- Honey Cup -- Nectar Teaspoon -- Nectar Cup -- Nectar Straw -- Thin Teaspoon -- Thin Cup Prominent cricopharyngeal segment;Reduced cricopharyngeal relaxation Thin Straw Reduced cricopharyngeal relaxation;Prominent cricopharyngeal segment Puree Reduced cricopharyngeal relaxation;Prominent cricopharyngeal segment Mechanical Soft -- Regular Reduced cricopharyngeal relaxation;Prominent cricopharyngeal segment Multi-consistency -- Pill -- Cervical Esophageal Comment -- DeBlois, Katherene Ponto 06/16/2020, 12:33 PM               Principal Problem:   [place] Active Problems:   HTN (hypertension), benign   Fibromyalgia syndrome   OSA (obstructive sleep apnea)   Chronic diastolic CHF, NYHA class 1   History of pulmonary embolism   Pernicious anemia   Anxiety and depression   RBBB   COPD (chronic obstructive pulmonary disease) ([place])   Dysphagia    Tye Savoy, NP-C @  06/16/2020, 3:09 PM

## 2020-06-16 NOTE — Progress Notes (Signed)
SLP Cancellation Note  Patient Details Name: Linda [name] MRN: [id number] DOB: 02/25/1946   Cancelled treatment:       Reason Eval/Treat Not Completed: Other (comment). Pt readmitted after MBS yesterday with chest pain. NPO for bronchoscopy. No further acute SLP f/u warranted at this time. MBS recommendations still stand. Pt to resume a soft, no meat diet. She has the appearance of decreased cricopharyngeal relaxation, which is a probably place for meats to lodge. She also had the appearance of a primary esophageal impairment. F/u with GI is warranted at some point. To clarify, pt choked on a chicken chimichanga at her independent living facility prior to admit. I doubt that she ate another chicken wrap just before her d/c while waiting for pickup, right after I advised her to eat soft foods and avoid meat. I think she had chest pain but not a second choking episode, though I havent discussed with pt today. Will d/c current SLP eval orders. If diet is ordered after bronch. Recommend dys 3, no meat, thin liquids.    Rodolfo Gaster, Katherene Ponto 06/16/2020, 8:08 AM

## 2020-06-16 NOTE — ED Notes (Signed)
PT had a small BM on self .Writer clean and change PT. PT upset state she spoke to different MD and was not info of procedures time. Request to leave AMA. RN have been made aware.

## 2020-06-16 NOTE — Consult Note (Signed)
Referring Provider:  [facility]         Primary Care Physician:  Tammi Sou, MD Primary Gastroenterologist:  unassigned           We were asked to see this patient for:  choking                ASSESSMENT /  PLAN    Linda [name] is a 74 y.o. female PMH significant for, but not necessarily limited to, OSA, asthma,  pernicious anemia, PE, RBBB, HTN, chronic diastolic heart failure, fibromyalgia, anxiety, idiopathic angioedema urticaria, appendectomy, fibromyalgia.                                                                                                                                   # Solid food dysphagia --Chronic intermittent. MBSS suggests oropharyngeal dysphagia with decreased relation of UES.  --We discussed EGD, perhaps we could dilated UES, will discuss with Dr. Havery Moros but patient is somewhat reluctant given need for sedation.     HPI:    Chief Complaint: choking.   Linda [name] is a 74 y.o. female who was admitted 06/14/2020 after choking on a piece of chicken.  A bystander did the Heimlich maneuver without results.  Patient stuck her finger down her throat and pulled out a small amount of chicken.  Ultimately she was unable to breathe or talk so EMS was called.  Seen by ENT in ED, got a bedside scope with no findings of foreign body/food.   PCCM was asked to see and evaluate for possible lower airway obstruction.  Chest CT scan did not show any obstructing foreign bodies.  Due to ongoing stridor she was kept overnight for consideration of bronchoscopy.  Inpatient MBSS revealed mild cervical esophageal dysphagia / decreased relaxation of cricopharyngeus muscle.    One instance of frank flash penetration when liquid hit closed UES.. Mechanical soft and thin liquids recommended. The PCCM did not feel bronchoscopy was indicated with improving stridor, patient was discharged home.  She would follow-up in the pulmonary clinic for evaluation of  asbestosis.  After discharge yesterday she started having chest pain, took 3 NTG. Daughter in law wanted to take her back to hospital so readmitted. PCCM had seen and scheduled bronchoscopy for today but patient declined. Apparently she has concerns about sedation.   [name] gives a history of chronic intermittent solid food dysphagia but she also sometimes 'chokes" on liquids. No other GI complaints. Reports colonoscopy a few years ago in [place], ? Polyps removed.   PREVIOUS ENDOSCOPIC EVALUATIONS / GI STUDIES :  2015 EGD for melena Gastritis in the gastric body 2. Nodule in the gastric antrum; multiple biopsies  Biopsies : Stomach, biopsy, antral nodule - REACTIVE GASTROPATHY (ANTRAL MUCOSA) SEE COMMENT. - WARTHIN STARRY STAIN NEGATIVE FOR H. PYLORI - NEGATIVE FOR INTESTINAL METAPLASIA, DYSPLASIA, OR MALIGNANCY  Past Medical History:  Diagnosis Date  . Acute  upper GI bleed 06/2014   while pt taking coumadin, plavix, and meloxicam---despite being told not to take coumadin.  . Anginal pain ([place])    Nonobstructive CAD 2014; however, her cardiologist put her on a statin for this and NOT for hyperlipidemia per pt report.  Atyp CP 08/2017 at card f/u, plan for myoc perf imaging.  . Anxiety    panic attacks  . Asthma    w/ asbestososis   . BPPV (benign paroxysmal positional vertigo) 12/16/2012  . Chronic diastolic CHF (congestive heart failure) (HCC)    dry wt as of 11/06/16 is 168 lbs.  . Chronic lower back pain   . COPD (chronic obstructive pulmonary disease) ([place])   . DDD (degenerative disc disease)    lumbar and cervical.   . Diverticular disease   . Fibromyalgia    Patient states dx was around her late 53s but she had sx's for years prior to this.  . H/O hiatal hernia   . History of pneumonia    hospitalized 12/2011, 02/2013, and 07/2013 [facility]) for this  . HTN (hypertension)    Renal artery dopplers 04/2013 neg for stenosis.  . Hypervitaminosis D 09/27/2019    over-supplemented.  Stopped vit D and plan recheck 2 mo.  . Idiopathic angio-edema-urticaria [id number]; 2021   Angioedema component was very minimal.  2021->Dr. Bobbitt (allergist) eval.  . Insomnia   . Iron deficiency anemia    Hematologist in [place], [place] did extensive w/u; no cause found; failed oral supplement;; gets fairly regular (q33m or so) IV iron infusions (Venofer -iron sucrose- 200mg  with procrit.  "for 14 yr I've been getting blood work q month & getting infusions prn" (07/12/2013).  Dr. Marin Olp locally, iron infusions done, EPO deficiency dx'd  . Migraine syndrome    "not as often anymore; used to be ~ q wk" (07/12/2013)  . Mixed incontinence urge and stress   . Nephrolithiasis    "passed all on my own or they are still in there" (07/12/2013)  . Neuroleptic induced Parkinsonism ([place]) 2018   Dr. Carles Collet, neuro, saw her 11/24/17 and recommended d/c of abilify as first step.  D/c'd abilify and pt got complete recovery.  . OSA on CPAP    prior to move to [place]--had another sleep study 10/2015 w/pulm Dr. Camillo Flaming.  . Osteoarthritis    "severe; progressing fast" (07/12/2013); multiple joints-not surgical candidate for TKR (03/2015).  Triamcinolon knee injections by Dr. Tessa Lerner 12/2017.  [name] Kitchen Pernicious anemia 08/24/2014  . Pleural plaque with presence of asbestos 07/22/2013  . Pulmonary embolism ([place]) 07/2013   Dx at [facility] with very small peripheral upper lobe pe 07/2013: pt took coumadin for about 8-9 mo  . Pyelonephritis    "several times over the last 30 yr" (07/12/2013)  . RBBB (right bundle branch block)   . Recurrent major depression ([place])   . Recurrent UTI    hx of hospitalization for pyelonephritis; started abx prophylaxis 06/2015  . Syncope    Hypotensive; ED visit--Dr. Terrence Dupont did Cath--nonobstructive CAD, EF 55-60%.  In retrospect, suspect pt rx med misuse/polypharmacy    Past Surgical History:  Procedure Laterality Date  . APPENDECTOMY  1960  . AXILLARY SURGERY Left 1978   Multiple "lump" in  armpit per pt  . CARDIAC CATHETERIZATION  01/2013   nonobstructive CAD, EF 55-60%  . CARDIOVASCULAR STRESS TEST  02/22/15   Low risk myocard perf imaging; wall motion normal, normal EF  . carotid duplex doppler  10/21/2017   R vertebral flow  suggestive of possible distal obstruction.  Pt declined further w/u as of 10/29/17 but need to revisit this problem periodically.  . COCCYX REMOVAL  1972  . DEXA  06/05/2017   T-score -3.1  . DILATION AND CURETTAGE OF UTERUS  ? 1970's  . ESOPHAGOGASTRODUODENOSCOPY N/A 07/19/2014   Gastritis found + in the setting of supratherapeutic INR, +plavix, + meloxicam.  . EYE SURGERY Left 2012-2013   "injections for ~ 1 yr; don't really know what for" (07/12/2013)  . HEEL SPUR SURGERY Left 2008  . KNEE SURGERY  2005  . LEFT HEART CATHETERIZATION WITH CORONARY ANGIOGRAM N/A 01/30/2013   Procedure: LEFT HEART CATHETERIZATION WITH CORONARY ANGIOGRAM;  Surgeon: Clent Demark, MD;  Location: [facility] CATH LAB;  Service: Cardiovascular;  Laterality: N/A;  . PLANTAR FASCIA RELEASE Left 2008  . SPIROMETRY  04/25/14   In hosp for acute asthma/COPD flare: mixed obstructive and restrictive lung disease. The FEV1 is severely reduced at 45% predicted.  FEV1 signif decreased compared to prior spirometry 07/23/13.  . TENDON RELEASE  1996   Right forearm and hand  . TOTAL ABDOMINAL HYSTERECTOMY  1974  . TRANSTHORACIC ECHOCARDIOGRAM  01/2013; 04/2014;08/2015; 09/2017   2014--NORMAL.  2015--focal basal septal hypertrophy, EF 55-60%, grade I diast dysfxn, mild LAE.  08/2015 EF 55-60%, nl LV syst fxn, grade I DD, valves wnl. 10/21/17: EF 65-70%, grd I DD, o/w normal.    Prior to Admission medications   Medication Sig Start Date End Date Taking? Authorizing Provider  albuterol (VENTOLIN HFA) 108 (90 Base) MCG/ACT inhaler Inhale 1-2 puffs into the lungs every 6 (six) hours as needed for wheezing or shortness of breath. 12/06/19   McGowen, Adrian Blackwater, MD  alendronate (FOSAMAX) 70 MG tablet Take 1  tablet (70 mg total) by mouth every 7 (seven) days. Take with a full glass of water on an empty stomach, first thing in the morning and remain up right for 30 minutes after taking. 07/26/19   McGowen, Adrian Blackwater, MD  ALPRAZolam (XANAX) 1 MG tablet TAKE 1 TABLET THREE TIMES DAILY AS NEEDED FOR ANXIETY. Patient taking differently: Take 1 mg by mouth 3 (three) times daily as needed for anxiety or sleep.  05/29/20   McGowen, Adrian Blackwater, MD  aspirin 81 MG tablet Take 81 mg by mouth at bedtime.    [provider]  cyclobenzaprine (FLEXERIL) 5 MG tablet Take 1 tablet (5 mg total) by mouth 3 (three) times daily as needed for muscle spasms. 05/24/20   Meredith Staggers, MD  donepezil (ARICEPT) 10 MG tablet Take 1 tablet (10 mg total) by mouth at bedtime. 01/31/20   McGowen, Adrian Blackwater, MD  DULoxetine (CYMBALTA) 30 MG capsule TAKE 1 CAPSULE ONCE DAILY ALONG WITH 60MG  CAPSULE FOR TOTAL 90MG  DAILY. Patient taking differently: Take by mouth daily. Along with 60 mg capsule for total 90 mg. 04/17/20   McGowen, Adrian Blackwater, MD  DULoxetine (CYMBALTA) 60 MG capsule TAKE 1 CAPSULE A DAY TO BE COMBINED WITH 30MG  CAPSULE Patient taking differently: Take 60 mg by mouth daily. Take with 30mg  to total 90mg  daily. 07/27/19   McGowen, Adrian Blackwater, MD  EPINEPHrine 0.3 mg/0.3 mL IJ SOAJ injection Inject 0.3 mLs (0.3 mg total) into the muscle as needed for anaphylaxis. 01/22/20   Geradine Girt, DO  fluconazole (DIFLUCAN) 100 MG tablet TAKE (1) TABLET DAILY AS NEEDED FOR THRUSH Patient taking differently: Take 100 mg by mouth daily as needed (for thrush).  05/03/20   McGowen, Adrian Blackwater, MD  fluticasone (FLONASE) 50 MCG/ACT nasal spray Place 2 sprays into both nostrils daily. 09/27/19   McGowen, Adrian Blackwater, MD  furosemide (LASIX) 40 MG tablet Take 1 tablet (40 mg total) by mouth daily as needed for fluid. 10/07/19   Cherylann Ratel A, DO  ipratropium (ATROVENT) 0.03 % nasal spray Place 2 sprays into both nostrils every 12 (twelve) hours. 09/27/19    McGowen, Adrian Blackwater, MD  isosorbide mononitrate (IMDUR) 30 MG 24 hr tablet TAKE 2 TABLETS IN THE MORNING AND 1 TABLET IN THE EVENING. Patient taking differently: Take 30 mg by mouth See admin instructions. Takes 2 tablets by mouth in the morning and 1 tablet in the evening. 05/24/20   Lelon Perla, MD  lamoTRIgine (LAMICTAL) 150 MG tablet TAKE 1 TABLET EACH DAY. Patient taking differently: Take 150 mg by mouth daily.  11/22/19   McGowen, Adrian Blackwater, MD  metoprolol succinate (TOPROL-XL) 50 MG 24 hr tablet TAKE (1) TABLET DAILY IN THE MORNING. Patient taking differently: Take 50 mg by mouth daily.  07/27/19   McGowen, Adrian Blackwater, MD  Multiple Vitamin (MULTIVITAMIN WITH MINERALS) TABS tablet Take 1 tablet by mouth daily.    [provider]  nitroGLYCERIN (NITROSTAT) 0.4 MG SL tablet Place 1 tablet (0.4 mg total) under the tongue every 5 (five) minutes as needed for chest pain (x 3 doses). 09/23/19   Lelon Perla, MD  ondansetron (ZOFRAN) 8 MG tablet Take 1 tablet (8 mg total) by mouth every 8 (eight) hours as needed for nausea or vomiting. 10/22/19   McGowen, Adrian Blackwater, MD  oxyCODONE-acetaminophen (PERCOCET) 10-325 MG tablet Every 6 hours as needed Patient taking differently: Take 1 tablet by mouth every 6 (six) hours as needed for pain. Every 6 hours as needed 05/24/20   Meredith Staggers, MD  pantoprazole (PROTONIX) 40 MG tablet TAKE 1 TABLET BY MOUTH DAILY. Patient taking differently: Take 40 mg by mouth daily.  12/10/19   McGowen, Adrian Blackwater, MD  rosuvastatin (CRESTOR) 20 MG tablet Take 1 tablet (20 mg total) by mouth daily. 09/23/19 02/28/28  Lelon Perla, MD  solifenacin (VESICARE) 10 MG tablet Take 1 tablet (10 mg total) by mouth daily. 04/28/20   McGowen, Adrian Blackwater, MD  traZODone (DESYREL) 50 MG tablet TAKE 2-4 TABLETS AT BEDTIME Patient taking differently: Take 150 mg by mouth at bedtime.  12/10/19   McGowen, Adrian Blackwater, MD    Current Facility-Administered Medications  Medication Dose  Route Frequency Provider Last Rate Last Admin  . acetaminophen (TYLENOL) tablet 650 mg  650 mg Oral Q6H PRN Lavina Hamman, MD       Or  . acetaminophen (TYLENOL) suppository 650 mg  650 mg Rectal Q6H PRN Lavina Hamman, MD      . ALPRAZolam Duanne Moron) tablet 0.5 mg  0.5 mg Oral TID PRN Lavina Hamman, MD   0.5 mg at 06/16/20 1326  . [START ON 06/17/2020] aspirin EC tablet 81 mg  81 mg Oral QHS Lavina Hamman, MD      . darifenacin (ENABLEX) 24 hr tablet 15 mg  15 mg Oral Daily Lavina Hamman, MD      . docusate sodium (COLACE) capsule 100 mg  100 mg Oral BID Lavina Hamman, MD   100 mg at 06/16/20 1318  . donepezil (ARICEPT) tablet 10 mg  10 mg Oral QHS Lavina Hamman, MD   10 mg at 06/15/20 2137  . DULoxetine (CYMBALTA) DR capsule 90 mg  90  mg Oral Daily Lavina Hamman, MD   90 mg at 06/15/20 2138  . enoxaparin (LOVENOX) injection 40 mg  40 mg Subcutaneous Q24H Lavina Hamman, MD   40 mg at 06/16/20 1319  . fluticasone (FLONASE) 50 MCG/ACT nasal spray 2 spray  2 spray Each Nare Daily Lavina Hamman, MD      . hydrALAZINE (APRESOLINE) tablet 25 mg  25 mg Oral Q6H PRN Lavina Hamman, MD      . ipratropium-albuterol (DUONEB) 0.5-2.5 (3) MG/3ML nebulizer solution 3 mL  3 mL Nebulization Q4H PRN Alma Friendly, MD      . isosorbide mononitrate (IMDUR) 24 hr tablet 30 mg  30 mg Oral QPM Lavina Hamman, MD   30 mg at 06/15/20 2101  . isosorbide mononitrate (IMDUR) 24 hr tablet 60 mg  60 mg Oral Daily Lavina Hamman, MD      . lamoTRIgine (LAMICTAL) tablet 150 mg  150 mg Oral Daily Lavina Hamman, MD   150 mg at 06/16/20 1318  . methylPREDNISolone sodium succinate (SOLU-MEDROL) 40 mg/mL injection 40 mg  40 mg Intravenous Q6H Mannam, Praveen, MD   40 mg at 06/16/20 1319  . metoprolol succinate (TOPROL-XL) 24 hr tablet 50 mg  50 mg Oral Daily Lavina Hamman, MD   50 mg at 06/16/20 1317  . ondansetron (ZOFRAN) tablet 4 mg  4 mg Oral Q6H PRN Lavina Hamman, MD       Or  . ondansetron  [facility]) injection 4 mg  4 mg Intravenous Q6H PRN Lavina Hamman, MD      . oxyCODONE-acetaminophen (PERCOCET/ROXICET) 5-325 MG per tablet 1 tablet  1 tablet Oral Q6H PRN Lavina Hamman, MD   1 tablet at 06/16/20 1325   And  . oxyCODONE (Oxy IR/ROXICODONE) immediate release tablet 5 mg  5 mg Oral Q6H PRN Lavina Hamman, MD   5 mg at 06/16/20 1325  . pantoprazole (PROTONIX) EC tablet 40 mg  40 mg Oral BID AC Lavina Hamman, MD   40 mg at 06/16/20 0730  . Racepinephrine HCl 2.25 % nebulizer solution 0.5 mL  0.5 mL Nebulization Q2H PRN Lavina Hamman, MD      . rosuvastatin (CRESTOR) tablet 20 mg  20 mg Oral Daily Lavina Hamman, MD      . traZODone (DESYREL) tablet 150 mg  150 mg Oral QHS Lavina Hamman, MD   150 mg at 06/15/20 2131    Allergies as of 06/15/2020 - Review Complete 06/15/2020  Allergen Reaction Noted  . Abilify [aripiprazole] Other (See Comments) 11/27/2017  . Penicillins Itching, Swelling, and Rash 10/16/2012    Family History  Problem Relation Age of Onset  . Arthritis Mother   . Kidney disease Mother   . Heart disease Father   . Stroke Father   . Hypertension Father   . Diabetes Father   . Heart attack Father   . Heart attack Paternal Grandmother   . Diabetes Sister        one sister  . Hypertension Sister   . Asthma Sister   . Hypertension Brother   . Asthma Brother   . Asthma Daughter   . Multiple sclerosis Son     Social History   Socioeconomic History  . Marital status: Widowed    Spouse name: Not on file  . Number of children: 2  . Years of education: Not on file  . Highest education level: Not on  file  Occupational History  . 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 Smoking status: Never Smoker  . Smokeless tobacco: Never Used  . Tobacco comment: never used tobacco  Vaping Use  . Vaping Use: Never used  Substance and Sexual Activity  . Alcohol use: No    Alcohol/week: 0.0 standard drinks  . Drug use: No   . Sexual activity: Not Currently  Other Topics Concern  . Not on file  Social History Narrative   Widowed, 2 sons.  Relocated to [place] 09/2012 to be closer to her son who has MS.   Husband d 2015--mesothelioma.   Occupation: former Pharmacist, hospital.   Education: masters degree level.   No T/A/Ds.   Lives in [place], independent living.   Social Determinants of Health   Financial Resource Strain:   . Difficulty of Paying Living Expenses:   Food Insecurity:   . Worried About Charity fundraiser in the Last Year:   . Arboriculturist in the Last Year:   Transportation Needs:   . Film/video editor (Medical):   [name] Kitchen Lack of Transportation (Non-Medical):   Physical Activity:   . Days of Exercise per Week:   . Minutes of Exercise per Session:   Stress:   . Feeling of Stress :   Social Connections:   . Frequency of Communication with Friends and Family:   . Frequency of Social Gatherings with Friends and Family:   . Attends Religious Services:   . Active Member of Clubs or Organizations:   . Attends Archivist Meetings:   [name] Kitchen Marital Status:   Intimate Partner Violence:   . Fear of Current or Ex-Partner:   . Emotionally Abused:   [name] Kitchen Physically Abused:   . Sexually Abused:     Review of Systems: All systems reviewed and negative except where noted in HPI.  Physical Exam: Vital signs in last 24 hours: Temp:  [98.2 F (36.8 C)-98.6 F (37 C)] 98.6 F (37 C) (06/25 1432) Pulse Rate:  [72-97] 77 (06/25 1432) Resp:  [11-26] 15 (06/25 1432) BP: (96-203)/(67-138) 168/80 (06/25 1432) SpO2:  [93 %-100 %] 100 % (06/25 1432)   General:   Alert, well-developed, female in NAD Psych:  Pleasant, cooperative. Normal mood and affect. Eyes:  Pupils equal, sclera clear, no icterus.   Conjunctiva pink. Ears:  Normal auditory acuity. Nose:  No deformity, discharge,  or lesions. Neck:  Supple; no masses Lungs:  Clear throughout to auscultation.   No wheezes, crackles, or rhonchi.   Heart:  Regular rate and rhythm; no murmurs, no lower extremity edema Abdomen:  Soft, non-distended, nontender, BS active, no palp mass   Rectal:  Deferred  Msk:  Symmetrical without gross deformities. . Neurologic:  Alert and  oriented x4;  grossly normal neurologically. Skin:  Intact without significant lesions or rashes.   Intake/Output from previous day: No intake/output data recorded. Intake/Output this shift: No intake/output data recorded.  Lab Results: Recent Labs    06/15/20 0327 06/15/20 1710 06/16/20 0442  WBC 7.9 13.8* 9.9  HGB 13.6 14.6 15.2*  HCT 42.6 45.7 46.4*  PLT 191 215 203   BMET Recent Labs    06/15/20 0327 06/15/20 1710 06/16/20 0442  NA 137 135 138  K 4.3 4.1 4.0  CL 103 101 101  CO2 24 27 27   GLUCOSE 132* 108* 147*  BUN 17 20 21   CREATININE 0.75 0.82 0.72  CALCIUM 8.7*  8.8* 9.1   LFT Recent Labs    06/16/20 0442  PROT 7.6  ALBUMIN 4.2  AST 25  ALT 26  ALKPHOS 61  BILITOT 0.5   PT/INR Recent Labs    06/16/20 0442  LABPROT 12.8  INR 1.0   Hepatitis Panel No results for input(s): HEPBSAG, HCVAB, HEPAIGM, HEPBIGM in the last 72 hours.   . CBC Latest Ref Rng & Units 06/16/2020 06/15/2020 06/15/2020  WBC 4.0 - 10.5 K/uL 9.9 13.8(H) 7.9  Hemoglobin 12.0 - 15.0 g/dL 15.2(H) 14.6 13.6  Hematocrit 36 - 46 % 46.4(H) 45.7 42.6  Platelets 150 - 400 K/uL 203 215 191    . CMP Latest Ref Rng & Units 06/16/2020 06/15/2020 06/15/2020  Glucose 70 - 99 mg/dL 147(H) 108(H) 132(H)  BUN 8 - 23 mg/dL 21 20 17   Creatinine 0.44 - 1.00 mg/dL 0.72 0.82 0.75  Sodium 135 - 145 mmol/L 138 135 137  Potassium 3.5 - 5.1 mmol/L 4.0 4.1 4.3  Chloride 98 - 111 mmol/L 101 101 103  CO2 22 - 32 mmol/L 27 27 24   Calcium 8.9 - 10.3 mg/dL 9.1 8.8(L) 8.7(L)  Total Protein 6.5 - 8.1 g/dL 7.6 - -  Total Bilirubin 0.3 - 1.2 mg/dL 0.5 - -  Alkaline Phos 38 - 126 U/L 61 - -  AST 15 - 41 U/L 25 - -  ALT 0 - 44 U/L 26 - -   Studies/Results: DG Chest 2  View  Result Date: 06/15/2020 CLINICAL DATA:  Chest pain. EXAM: CHEST - 2 VIEW COMPARISON:  CT chest and chest x-ray from yesterday. FINDINGS: The heart size and mediastinal contours are within normal limits. Normal pulmonary vascularity. Bilateral calcified pleural plaques again noted. No focal consolidation, pleural effusion, or pneumothorax. Multiple thoracic compression deformities again noted. Oral contrast within the visualized bowel. IMPRESSION: 1. No acute cardiopulmonary disease. Electronically Signed   By: Titus Dubin M.D.   On: 06/15/2020 17:35   CT CHEST WO CONTRAST  Result Date: 06/14/2020 CLINICAL DATA:  74 year old female with history of aspiration. EXAM: CT CHEST WITHOUT CONTRAST TECHNIQUE: Multidetector CT imaging of the chest was performed following the standard protocol without IV contrast. COMPARISON:  Chest CT 01/01/2016. FINDINGS: Cardiovascular: Heart size is normal. There is no significant pericardial fluid, thickening or pericardial calcification. There is aortic atherosclerosis, as well as atherosclerosis of the great vessels of the mediastinum and the coronary arteries, including calcified atherosclerotic plaque in the left main, left anterior descending, left circumflex and right coronary arteries. Mediastinum/Nodes: No pathologically enlarged mediastinal or hilar lymph nodes. Please note that accurate exclusion of hilar adenopathy is limited on noncontrast CT scans. Esophagus is unremarkable in appearance. No axillary lymphadenopathy. Lungs/Pleura: Numerous calcified pleural plaques bilaterally, indicative of asbestos related pleural disease. No pleural effusions. No suspicious appearing pulmonary nodules or masses are noted. No acute consolidative airspace disease. Some peripheral predominant areas of ground-glass attenuation and parenchymal banding throughout the mid to lower lungs bilaterally. No honeycombing. Upper Abdomen: Aortic atherosclerosis. Musculoskeletal: New  compression fractures of T7 and T9, most severe at T9 with 40% loss of anterior vertebral body height. There are no aggressive appearing lytic or blastic lesions noted in the visualized portions of the skeleton. IMPRESSION: 1. No imaging findings to suggest significant aspiration. 2. Asbestos related pleural disease with imaging findings in the lung bases bilaterally concerning for developing interstitial lung disease such as asbestosis. Outpatient referral to Pulmonology for appropriate follow-up is recommended. 3. Aortic atherosclerosis, in addition to left main  and 3 vessel coronary artery disease. Assessment for potential risk factor modification, dietary therapy or pharmacologic therapy may be warranted, if clinically indicated. 4. New compression fractures of T7 and T9, most severe at T9 where there is 40% loss of anterior vertebral body height. Aortic Atherosclerosis (ICD10-I70.0). Electronically Signed   By: Vinnie Langton M.D.   On: 06/14/2020 16:17   DG Swallowing Func-Speech Pathology  Result Date: 06/16/2020 Objective Swallowing Evaluation: Type of Study: MBS-Modified Barium Swallow Study  Patient Details Name: ARWA YERO MRN: [id number] Date of Birth: 1946-01-03 Today's Date: 06/16/2020 Time: SLP Start Time (ACUTE ONLY): 1326 -SLP Stop Time (ACUTE ONLY): 1340 SLP Time Calculation (min) (ACUTE ONLY): 14 min Past Medical History: Past Medical History: Diagnosis Date . Acute upper GI bleed 06/2014  while pt taking coumadin, plavix, and meloxicam---despite being told not to take coumadin. . Anginal pain ([place])   Nonobstructive CAD 2014; however, her cardiologist put her on a statin for this and NOT for hyperlipidemia per pt report.  Atyp CP 08/2017 at card f/u, plan for myoc perf imaging. . Anxiety   panic attacks . Asthma   w/ asbestososis  . BPPV (benign paroxysmal positional vertigo) 12/16/2012 . Chronic diastolic CHF (congestive heart failure) (HCC)   dry wt as of 11/06/16 is 168 lbs. . Chronic  lower back pain  . COPD (chronic obstructive pulmonary disease) ([place])  . DDD (degenerative disc disease)   lumbar and cervical.  . Diverticular disease  . Fibromyalgia   Patient states dx was around her late 102s but she had sx's for years prior to this. . H/O hiatal hernia  . History of pneumonia   hospitalized 12/2011, 02/2013, and 07/2013 [facility]) for this . HTN (hypertension)   Renal artery dopplers 04/2013 neg for stenosis. . Hypervitaminosis D 09/27/2019  over-supplemented.  Stopped vit D and plan recheck 2 mo. . Idiopathic angio-edema-urticaria [id number]; 2021  Angioedema component was very minimal.  2021->Dr. Bobbitt (allergist) eval. . Insomnia  . Iron deficiency anemia   Hematologist in [place], [place] did extensive w/u; no cause found; failed oral supplement;; gets fairly regular (q50m or so) IV iron infusions (Venofer -iron sucrose- 200mg  with procrit.  "for 14 yr I've been getting blood work q month & getting infusions prn" (07/12/2013).  Dr. Marin Olp locally, iron infusions done, EPO deficiency dx'd . Migraine syndrome   "not as often anymore; used to be ~ q wk" (07/12/2013) . Mixed incontinence urge and stress  . Nephrolithiasis   "passed all on my own or they are still in there" (07/12/2013) . 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 Carles Collet, neuro, saw her 11/24/17 and recommended d/c of abilify as first step.  D/c'd abilify and pt got complete recovery. . OSA on CPAP   prior to move to [place]--had another sleep study 10/2015 w/pulm Dr. Camillo Flaming. . Osteoarthritis   "severe; progressing fast" (07/12/2013); multiple joints-not surgical candidate for TKR (03/2015).  Triamcinolon knee injections by Dr. Tessa Lerner 12/2017. [name] Kitchen Pernicious anemia 08/24/2014 . Pleural plaque with presence of asbestos 07/22/2013 . Pulmonary embolism ([place]) 07/2013  Dx at [facility] with very small peripheral upper lobe pe 07/2013: pt took coumadin for about 8-9 mo . Pyelonephritis   "several times over the last 30 yr" (07/12/2013) . RBBB (right bundle branch block)   . Recurrent major depression ([place])  . Recurrent UTI   hx of hospitalization for pyelonephritis; started abx prophylaxis 06/2015 . Syncope   Hypotensive; ED visit--Dr. Terrence Dupont did Cath--nonobstructive CAD, EF 55-60%.  In retrospect,  suspect pt rx med misuse/polypharmacy Past Surgical History: Past Surgical History: Procedure Laterality Date . APPENDECTOMY  1960 . AXILLARY SURGERY Left 1978  Multiple "lump" in armpit per pt . CARDIAC CATHETERIZATION  01/2013  nonobstructive CAD, EF 55-60% . CARDIOVASCULAR STRESS TEST  02/22/15  Low risk myocard perf imaging; wall motion normal, normal EF . carotid duplex doppler  10/21/2017  R vertebral flow suggestive of possible distal obstruction.  Pt declined further w/u as of 10/29/17 but need to revisit this problem periodically. . COCCYX REMOVAL  1972 . DEXA  06/05/2017  T-score -3.1 . DILATION AND CURETTAGE OF UTERUS  ? 1970's . ESOPHAGOGASTRODUODENOSCOPY N/A 07/19/2014  Gastritis found + in the setting of supratherapeutic INR, +plavix, + meloxicam. . EYE SURGERY Left 2012-2013  "injections for ~ 1 yr; don't really know what for" (07/12/2013) . HEEL SPUR SURGERY Left 2008 . KNEE SURGERY  2005 . LEFT HEART CATHETERIZATION WITH CORONARY ANGIOGRAM N/A 01/30/2013  Procedure: LEFT HEART CATHETERIZATION WITH CORONARY ANGIOGRAM;  Surgeon: Clent Demark, MD;  Location: [facility] CATH LAB;  Service: Cardiovascular;  Laterality: N/A; . PLANTAR FASCIA RELEASE Left 2008 . SPIROMETRY  04/25/14  In hosp for acute asthma/COPD flare: mixed obstructive and restrictive lung disease. The FEV1 is severely reduced at 45% predicted.  FEV1 signif decreased compared to prior spirometry 07/23/13. . TENDON RELEASE  1996  Right forearm and hand . TOTAL ABDOMINAL HYSTERECTOMY  1974 . TRANSTHORACIC ECHOCARDIOGRAM  01/2013; 04/2014;08/2015; 09/2017  2014--NORMAL.  2015--focal basal septal hypertrophy, EF 55-60%, grade I diast dysfxn, mild LAE.  08/2015 EF 55-60%, nl LV syst fxn, grade I DD, valves wnl. 10/21/17: EF 65-70%, grd  I DD, o/w normal. HPI: 74 y/o F who presented to [facility] ER on 6/23 after a choking episode requiring abdominal thrusts.  Concern for retained chicken nugget.  ENT finding negative for Upper airway obstruction, pulmonology reports pts stridor has resolved and bronch not needed.  medical history significant of idiopathic angioedema, nonobstructive CAD, chronic diastolic congestive heart failure, COPD on 3 L home oxygen, PE, neuroleptic induced parkinsonism, hypertension, fibromyalgia, anxiety, asthma. Was seen early in 2021 for idiopathin angioedema of lips tongue causing some acute dysphagia which resolved quickly. Pt able to consume regular solids by the time of d/c. Upper endoscopy in 2015 showed some gastritis, but no stricture.  No data recorded Assessment / Plan / Recommendation CHL IP CLINICAL IMPRESSIONS 06/15/2020 Clinical Impression Pt demonstrates a mild cervical esophageal dysphagia with appearance of decreased relaxation of cricopharyngeus muscle. Pt had one instance of traace frank flash penetration when liquid hit closed UES and was propelled into pharynx. Pt had trace residue post swallwo that she sensed and cleared with a second swallow. In general her behavior and function was much more controlled during exam than in the room. Esophageal sweep revealed open esophageal colum and distal residue, no radiologist present to confirm. Pt may be at ongoing risk of food lodging in the cervical esophagus. Recommend pt avoid meats for a time. She is otherwise safe to continue with thin liquids and soft chewable solids. Discussed with pt. May need to f/u with GI.  SLP Visit Diagnosis Dysphagia, oropharyngeal phase (R13.12) Attention and concentration deficit following -- Frontal lobe and executive function deficit following -- Impact on safety and function Moderate aspiration risk   CHL IP TREATMENT RECOMMENDATION 06/15/2020 Treatment Recommendations Therapy as outlined in treatment plan below   Prognosis 01/21/2020  Prognosis for Safe Diet Advancement Good Barriers to Reach Goals -- Barriers/Prognosis Comment -- CHL IP  DIET RECOMMENDATION 06/15/2020 SLP Diet Recommendations Thin liquid;Dysphagia 3 (Mech soft) solids Liquid Administration via Cup;Straw Medication Administration Crushed with puree Compensations Slow rate;Small sips/bites Postural Changes Remain semi-upright after after feeds/meals (Comment);Seated upright at 90 degrees   CHL IP OTHER RECOMMENDATIONS 06/15/2020 Recommended Consults Consider GI evaluation Oral Care Recommendations Oral care BID Other Recommendations --   CHL IP FOLLOW UP RECOMMENDATIONS 06/15/2020 Follow up Recommendations Home health SLP   CHL IP FREQUENCY AND DURATION 01/21/2020 Speech Therapy Frequency (ACUTE ONLY) min 2x/week Treatment Duration --      CHL IP ORAL PHASE 06/15/2020 Oral Phase WFL Oral - Pudding Teaspoon -- Oral - Pudding Cup -- Oral - Honey Teaspoon -- Oral - Honey Cup -- Oral - Nectar Teaspoon -- Oral - Nectar Cup -- Oral - Nectar Straw -- Oral - Thin Teaspoon -- Oral - Thin Cup -- Oral - Thin Straw -- Oral - Puree -- Oral - Mech Soft -- Oral - Regular -- Oral - Multi-Consistency -- Oral - Pill -- Oral Phase - Comment --  CHL IP PHARYNGEAL PHASE 06/15/2020 Pharyngeal Phase WFL;Impaired Pharyngeal- Pudding Teaspoon -- Pharyngeal -- Pharyngeal- Pudding Cup -- Pharyngeal -- Pharyngeal- Honey Teaspoon -- Pharyngeal -- Pharyngeal- Honey Cup -- Pharyngeal -- Pharyngeal- Nectar Teaspoon -- Pharyngeal -- Pharyngeal- Nectar Cup -- Pharyngeal -- Pharyngeal- Nectar Straw -- Pharyngeal -- Pharyngeal- Thin Teaspoon -- Pharyngeal -- Pharyngeal- Thin Cup Penetration/Apiration after swallow;Trace aspiration Pharyngeal Material enters airway, CONTACTS cords and then ejected out;Material does not enter airway Pharyngeal- Thin Straw -- Pharyngeal -- Pharyngeal- Puree WFL Pharyngeal -- Pharyngeal- Mechanical Soft -- Pharyngeal -- Pharyngeal- Regular WFL Pharyngeal -- Pharyngeal- Multi-consistency --  Pharyngeal -- Pharyngeal- Pill -- Pharyngeal -- Pharyngeal Comment --  CHL IP CERVICAL ESOPHAGEAL PHASE 06/15/2020 Cervical Esophageal Phase Impaired Pudding Teaspoon -- Pudding Cup -- Honey Teaspoon -- Honey Cup -- Nectar Teaspoon -- Nectar Cup -- Nectar Straw -- Thin Teaspoon -- Thin Cup Prominent cricopharyngeal segment;Reduced cricopharyngeal relaxation Thin Straw Reduced cricopharyngeal relaxation;Prominent cricopharyngeal segment Puree Reduced cricopharyngeal relaxation;Prominent cricopharyngeal segment Mechanical Soft -- Regular Reduced cricopharyngeal relaxation;Prominent cricopharyngeal segment Multi-consistency -- Pill -- Cervical Esophageal Comment -- DeBlois, Katherene Ponto 06/16/2020, 12:33 PM               Principal Problem:   Stridor Active Problems:   HTN (hypertension), benign   Fibromyalgia syndrome   OSA (obstructive sleep apnea)   Chronic diastolic CHF, NYHA class 1   History of pulmonary embolism   Pernicious anemia   Anxiety and depression   RBBB   COPD (chronic obstructive pulmonary disease) ([place])   Dysphagia    Tye Savoy, NP-C @  06/16/2020, 3:09 PM

## 2020-06-16 NOTE — Progress Notes (Addendum)
NAME:  Linda [name], MRN:  [id number], DOB:  1946/12/22, LOS: 1 ADMISSION DATE:  06/15/2020, CONSULTATION DATE: 6/23 REFERRING MD:  Dr. Maryan Rued, CHIEF COMPLAINT: Choking episode   Brief History   74 y/o F who presented to [facility] ER on 6/23 after a choking episode requiring abdominal thrusts.  Concern for retained chicken nugget.    History of present illness   74 y/o F, assisted living resident, who was eating lunch when she choked on a chicken nugget.  She required abdominal thrusts by staff but continued to have shortness of breath. On arrival to the ER, the patient reportedly had stridor with "gasping respirations". She was placed on a NRB for comfort (pt maintaining saturations).  Initial CXR showed known pleural plaques, no active disease.  ENT saw the patient in evaluation and assessed the upper airway due to upper airway noise which was negative.    PCCM called for evaluation of possible need for bronchoscopy.   Past Medical History  OSA on CPAP  COPD  Asbestos Exposure with Pleural Plaques Pernicious Anemia  PE - dx 2014 RBBB HTN Chronic Diastolic CHF Panic Attacks Neuroleptic induced Parkinsonism Idiopathic Angio-Edema Urticaria  Fibromyalgia  Anxiety [facility] - 2015  Significant Hospital Events   6/23 Admit for obs after chicken nugget choking episode. No bronch as pt improved and CT was ok.   6/24 Discharged and then immediatly readmit with stridor and chest pain.  Consults:  PCCM  Procedures:    Significant Diagnostic Tests:  CT Chest w/o Contrast 6/23 >> airways are clear with no findings of significant aspiration.  Asbestos related lung disease.  Micro Data:  COVID 6/23 >> negative  Antimicrobials:     Interim history/subjective:   Notified that patient is refusing to go ahead with bronchoscopy and wants to leave AMA.  Vitals are stable overnight.    No stridor noted today morning.  Patient is speaking full sentences.  Objective   Blood pressure  (!) 146/74, pulse 72, temperature 98.2 F (36.8 C), temperature source Oral, resp. rate 16, SpO2 99 %.       No intake or output data in the 24 hours ending 06/16/20 1221 There were no vitals filed for this visit.  Examination: Gen:      No acute distress HEENT:  EOMI, sclera anicteric Neck:     No masses; no thyromegaly Lungs:    Clear to auscultation bilaterally; normal respiratory effort CV:         Regular rate and rhythm; no murmurs Abd:      + bowel sounds; soft, non-tender; no palpable masses, no distension Ext:    No edema; adequate peripheral perfusion Skin:      Warm and dry; no rash Neuro: alert and oriented x 3 Psych: normal mood and affect  Resolved Hospital Problem list     Assessment & Plan:  Choking Episode s/p Abdominal Thrusts Dyspnea  Upper airway laryngoscopy evaluation per ENT without retained foreign body.   CT chest negative for any aspiration Recurrent stridor is suspicious for vocal cord dysfunction  We have scheduled bronchoscope at 12:30 p.m. today for airway inspection but patient has canceled.  She states that she is nervous about the procedure and side effects.  Also is upset about lack of communication about timing of procedure.  I explained to her that since we evaluated her late yesterday we could not get her on the schedule until today morning.   Procedure canceled by patient request.  Continue monitoring  for any recurrence of symptoms  [name] CT scan with pleural plaques and mild pulmonary fibrosis at the base.  She follows with Dr. Camillo Flaming pulmonary at [facility].   PCCM will be available as needed please call with questions.   Critical care time: n/a   Marshell Garfinkel MD [facility] Please see [email or web address] for pager details.  06/16/2020, 12:21 PM

## 2020-06-16 NOTE — Progress Notes (Signed)
Procedure canceled - pt refusing in ED.  Dr. Vaughan Browner aware.

## 2020-06-17 ENCOUNTER — Encounter (HOSPITAL_COMMUNITY): Payer: Self-pay | Admitting: Internal Medicine

## 2020-06-17 ENCOUNTER — Inpatient Hospital Stay (HOSPITAL_COMMUNITY): Payer: Medicare Other | Admitting: Registered Nurse

## 2020-06-17 ENCOUNTER — Encounter (HOSPITAL_COMMUNITY)
Admission: EM | Disposition: A | Discharge status: Home / Self Care | Payer: Self-pay | Source: Home / Self Care | Attending: Internal Medicine

## 2020-06-17 DIAGNOSIS — I10 Essential (primary) hypertension: Secondary | ICD-10-CM

## 2020-06-17 HISTORY — PX: BIOPSY: SHX5522

## 2020-06-17 HISTORY — PX: MALONEY DILATION: SHX5535

## 2020-06-17 HISTORY — PX: ESOPHAGOGASTRODUODENOSCOPY (EGD) WITH PROPOFOL: SHX5813

## 2020-06-17 LAB — CBC WITH DIFFERENTIAL/PLATELET
Abs Immature Granulocytes: 0.06 10*3/uL (ref 0.00–0.07)
Basophils Absolute: 0 10*3/uL (ref 0.0–0.1)
Basophils Relative: 0 %
Eosinophils Absolute: 0 10*3/uL (ref 0.0–0.5)
Eosinophils Relative: 0 %
HCT: 48.7 % — ABNORMAL HIGH (ref 36.0–46.0)
Hemoglobin: 15.4 g/dL — ABNORMAL HIGH (ref 12.0–15.0)
Immature Granulocytes: 1 %
Lymphocytes Relative: 11 %
Lymphs Abs: 1.5 10*3/uL (ref 0.7–4.0)
MCH: 28.7 pg (ref 26.0–34.0)
MCHC: 31.6 g/dL (ref 30.0–36.0)
MCV: 90.7 fL (ref 80.0–100.0)
Monocytes Absolute: 0.6 10*3/uL (ref 0.1–1.0)
Monocytes Relative: 5 %
Neutro Abs: 11 10*3/uL — ABNORMAL HIGH (ref 1.7–7.7)
Neutrophils Relative %: 83 %
Platelets: 211 10*3/uL (ref 150–400)
RBC: 5.37 MIL/uL — ABNORMAL HIGH (ref 3.87–5.11)
RDW: 13.5 % (ref 11.5–15.5)
WBC: 13.1 10*3/uL — ABNORMAL HIGH (ref 4.0–10.5)
nRBC: 0 % (ref 0.0–0.2)

## 2020-06-17 LAB — BASIC METABOLIC PANEL
Anion gap: 10 (ref 5–15)
BUN: 26 mg/dL — ABNORMAL HIGH (ref 8–23)
CO2: 25 mmol/L (ref 22–32)
Calcium: 8.4 mg/dL — ABNORMAL LOW (ref 8.9–10.3)
Chloride: 99 mmol/L (ref 98–111)
Creatinine, Ser: 0.66 mg/dL (ref 0.44–1.00)
GFR calc Af Amer: >60 mL/min (ref >60)
GFR calc non Af Amer: >60 mL/min (ref >60)
Glucose, Bld: 133 mg/dL — ABNORMAL HIGH (ref 70–99)
Potassium: 4.1 mmol/L (ref 3.5–5.1)
Sodium: 134 mmol/L — ABNORMAL LOW (ref 135–145)

## 2020-06-17 SURGERY — ESOPHAGOGASTRODUODENOSCOPY (EGD) WITH PROPOFOL
Anesthesia: Monitor Anesthesia Care

## 2020-06-17 MED ORDER — LORAZEPAM 2 MG/ML IJ SOLN
1.0000 mg | Freq: Three times a day (TID) | INTRAMUSCULAR | Status: DC | PRN
  Administered 2020-06-17 – 2020-06-19 (×5): 1 mg via INTRAVENOUS
  Filled 2020-06-17 (×5): qty 1

## 2020-06-17 MED ORDER — LIDOCAINE HCL (CARDIAC) PF 100 MG/5ML IV SOSY
PREFILLED_SYRINGE | INTRAVENOUS | Status: DC | PRN
  Administered 2020-06-17: 50 mg via INTRAVENOUS

## 2020-06-17 MED ORDER — MORPHINE SULFATE (PF) 2 MG/ML IV SOLN
2.0000 mg | INTRAVENOUS | Status: DC | PRN
  Administered 2020-06-17 – 2020-06-19 (×11): 2 mg via INTRAVENOUS
  Filled 2020-06-17 (×11): qty 1

## 2020-06-17 MED ORDER — PROPOFOL 10 MG/ML IV BOLUS
INTRAVENOUS | Status: DC | PRN
  Administered 2020-06-17: 40 mg via INTRAVENOUS
  Administered 2020-06-17: 30 mg via INTRAVENOUS

## 2020-06-17 MED ORDER — MIDAZOLAM HCL 5 MG/5ML IJ SOLN
INTRAMUSCULAR | Status: DC | PRN
  Administered 2020-06-17: .5 mg via INTRAVENOUS
  Administered 2020-06-17: 1.5 mg via INTRAVENOUS

## 2020-06-17 MED ORDER — PROPOFOL 500 MG/50ML IV EMUL
INTRAVENOUS | Status: DC | PRN
  Administered 2020-06-17: 200 ug/kg/min via INTRAVENOUS

## 2020-06-17 MED ORDER — LACTATED RINGERS IV SOLN
INTRAVENOUS | Status: AC | PRN
  Administered 2020-06-17: 1000 mL via INTRAVENOUS

## 2020-06-17 MED ORDER — MUSCLE RUB 10-15 % EX CREA
TOPICAL_CREAM | CUTANEOUS | Status: DC | PRN
  Filled 2020-06-17: qty 85

## 2020-06-17 MED ORDER — LACTATED RINGERS IV SOLN
INTRAVENOUS | Status: DC | PRN
Start: 2020-06-17 — End: 2020-06-17

## 2020-06-17 MED ORDER — MIDAZOLAM HCL 2 MG/2ML IJ SOLN
INTRAMUSCULAR | Status: AC
  Filled 2020-06-17: qty 2

## 2020-06-17 SURGICAL SUPPLY — 15 items

## 2020-06-17 NOTE — Anesthesia Preprocedure Evaluation (Addendum)
Anesthesia Evaluation  Patient identified by MRN, date of birth, ID band Patient awake    Reviewed: Allergy & Precautions, NPO status , Patient's Chart, lab work & pertinent test results, reviewed documented beta blocker date and time   History of Anesthesia Complications Negative for: history of anesthetic complications  Airway Mallampati: II  TM Distance: >3 FB Neck ROM: Full    Dental  (+) Dental Advisory Given, Upper Dentures   Pulmonary asthma , sleep apnea , COPD,  COPD inhaler, PE  [name]    Pulmonary exam normal        Cardiovascular hypertension, Pt. on medications and Pt. on home beta blockers + angina + CAD  Normal cardiovascular exam+ dysrhythmias    Nitroglycerin use several days ago for chest pain, temporally related to choking incident (unclear if cardiac related)  '18 TTE - Preserved EF, grade 1 diastolic dysfx  '18 Carotid US - 1-39% b/l ICAS    Neuro/Psych  Headaches, PSYCHIATRIC DISORDERS Anxiety Depression  BPPV     GI/Hepatic Neg liver ROS, hiatal hernia, GERD  Medicated and Controlled,  Endo/Other  negative endocrine ROS  Renal/GU negative Renal ROS  Female GU complaint     Musculoskeletal  (+) Arthritis , Fibromyalgia -  Abdominal   Peds  Hematology  Erythrocytosis (Hct 48.7) Leukocytosis (WBC 13.1)    Anesthesia Other Findings Covid test negative   Reproductive/Obstetrics                            Anesthesia Physical Anesthesia Plan  ASA: III  Anesthesia Plan: MAC   Post-op Pain Management:    Induction: Intravenous  PONV Risk Score and Plan: 2 and Propofol infusion and Treatment may vary due to age or medical condition  Airway Management Planned: Nasal Cannula and Natural Airway  Additional Equipment: None  Intra-op Plan:   Post-operative Plan:   Informed Consent: I have reviewed the patients History and Physical, chart, labs and  discussed the procedure including the risks, benefits and alternatives for the proposed anesthesia with the patient or authorized representative who has indicated his/her understanding and acceptance.       Plan Discussed with: CRNA and Anesthesiologist  Anesthesia Plan Comments:         Anesthesia Quick Evaluation

## 2020-06-17 NOTE — Progress Notes (Signed)
Dr Benson Norway at bedside

## 2020-06-17 NOTE — Progress Notes (Addendum)
PROGRESS NOTE  Linda [name]:[id number] DOB: 04-01-1946 DOA: 06/15/2020 PCP: Tammi Sou, MD  HPI/Recap of past 24 hours: HPI from Dr Phineas Real is a 74 y.o. female with past medical history of dementia, osteoporosis, osteoarthritis, [name], asbestosis, anxiety, fibromyalgia, recent diagnosis of dysphagia and likely vocal cord dysfunction. Patient presented with complaints of choking on the food.  She was recently hospitalized on 06/14/2020 for the same.  Pulmonary was consulted due to presence of the stridor.  Patient was able to swallow safely, stridor resolved and was subsequently discharged. While waiting for the ride back home she was eating a shredded chicken wrap and started having choking episode which led to severe chest pain for which she took 3 nitroglycerin and came back to the hospital.  In the ED, patient also noted to have some stridor/upper airway noises, patient was started on racemic epi and Decadron.  PCCM was consulted for possible bronchoscopy.  Patient admitted for further management.    Today, patient denies any new complaints, but noted to be very anxious and reporting acute on chronic back pain, still with some mild stridor/upper airway noises on inspiration.  Denies any chest pain, worsening shortness of breath, abdominal pain, nausea/vomiting, dysphagia, fever/chills.  Patient currently reconsidering bronchoscopy.    Assessment/Plan: Principal Problem:   Stridor Active Problems:   HTN (hypertension), benign   Fibromyalgia syndrome   OSA (obstructive sleep apnea)   Chronic diastolic CHF, NYHA class 1   History of pulmonary embolism   Pernicious anemia   Anxiety and depression   RBBB   COPD (chronic obstructive pulmonary disease) (HCC)   Dysphagia   Possible stridor/esophageal impairment Possible vocal cord dysfunction Acute hypoxic respiratory failure Currently on about 2 L of O2, saturating well Chest x-ray, CT chest  unremarkable for any evidence of aspiration ENT consulted performed flexible laryngoscopy on 6/23, reported normal upper airway evaluation.  No further recommendation PCCM consulted, patient initially refused bronchoscopy (concerns for anesthesia), but as of 06/17/2020, now reconsidering SLP consulted, MBS done which showed patient at risk for food lodging in the cervical esophagus and recommended to avoid meats GI consulted, EGD done on 06/17/2020 showed no endoscopic abnormality to explain patient's complaint of dysphagia.  Gross inspection of the vocal cords appeared to be normal.  Dilatation of the entire esophagus was done Continue racemic epinephrine neb as needed, steroids for now Continue clear liquid diet  Leukocytosis Currently afebrile, likely 2/2 steroid use Monitor closely, Daily CBC  History of COPD/asbestosis Continue steroids for now Follow-up with outpatient pulmonologist  Chronic diastolic HF Appears compensated Continue home meds  Fibromyalgia/chronic pain syndrome/anxiety Continue home pain regimen Advised for risk of aspiration   GERD Continue PPI  History of dementia Continue home regimen       Malnutrition Type:      Malnutrition Characteristics:      Nutrition Interventions:       Estimated body mass index is 25.86 kg/m as calculated from the following:   Height as of this encounter: 5\' 3"  (1.6 m).   Weight as of this encounter: 66.2 kg.     Code Status: Full  Family Communication: Discussed extensively with patient's, plan to discuss with son  Disposition Plan: Status is: Inpatient  Remains inpatient appropriate because:Inpatient level of care appropriate due to severity of illness   Dispo: The patient is from: ILF              Anticipated d/c is to: ILF  Anticipated d/c date is: 1 day              Patient currently is not medically stable to d/c.    Consultants:  PCCM  GI  Procedures: EGD on  06/17/20  Antimicrobials:  None  DVT prophylaxis: Lovenox   Objective: Vitals:   06/17/20 1147 06/17/20 1200 06/17/20 1205 06/17/20 1216  BP: (!) 151/64  (!) 152/87 (!) 164/71  Pulse:  64 67 63  Resp: 20 16 13 12   Temp: 98 F (36.7 C) 97.6 F (36.4 C)  98.1 F (36.7 C)  TempSrc:    Oral  SpO2: 96% 94% 95% 98%  Weight:      Height:        Intake/Output Summary (Last 24 hours) at 06/17/2020 1607 Last data filed at 06/17/2020 1421 Gross per 24 hour  Intake 740 ml  Output 450 ml  Net 290 ml   Filed Weights   06/16/20 1849 06/17/20 1000  Weight: 66.2 kg 66.2 kg    Exam:  General: NAD   Cardiovascular: S1, S2 present  Respiratory:  Diminished breath sounds bilaterally, noted some mild ?stridor  Abdomen: Soft, nontender, nondistended, bowel sounds present  Musculoskeletal: No bilateral pedal edema noted  Skin: Normal  Psychiatry:  Anxious    Data Reviewed: CBC: Recent Labs  Lab 06/14/20 1450 06/15/20 0327 06/15/20 1710 06/16/20 0442 06/17/20 0418  WBC 8.0 7.9 13.8* 9.9 13.1*  NEUTROABS 4.4  --   --   --  11.0*  HGB 13.5 13.6 14.6 15.2* 15.4*  HCT 41.4 42.6 45.7 46.4* 48.7*  MCV 91.2 91.4 89.8 90.3 90.7  PLT 188 191 215 203 250   Basic Metabolic Panel: Recent Labs  Lab 06/14/20 1450 06/15/20 0327 06/15/20 1710 06/16/20 0442 06/17/20 0418  NA 138 137 135 138 134*  K 4.1 4.3 4.1 4.0 4.1  CL 104 103 101 101 99  CO2 25 24 27 27 25   GLUCOSE 106* 132* 108* 147* 133*  BUN 20 17 20 21  26*  CREATININE 0.85 0.75 0.82 0.72 0.66  CALCIUM 8.6* 8.7* 8.8* 9.1 8.4*   GFR: Estimated Creatinine Clearance: 56.4 mL/min (by C-G formula based on SCr of 0.66 mg/dL). Liver Function Tests: Recent Labs  Lab 06/16/20 0442  AST 25  ALT 26  ALKPHOS 61  BILITOT 0.5  PROT 7.6  ALBUMIN 4.2   No results for input(s): LIPASE, AMYLASE in the last 168 hours. No results for input(s): AMMONIA in the last 168 hours. Coagulation Profile: Recent Labs  Lab  06/16/20 0442  INR 1.0   Cardiac Enzymes: No results for input(s): CKTOTAL, CKMB, CKMBINDEX, TROPONINI in the last 168 hours. BNP (last 3 results) No results for input(s): PROBNP in the last 8760 hours. HbA1C: No results for input(s): HGBA1C in the last 72 hours. CBG: Recent Labs  Lab 06/16/20 1532  GLUCAP 137*   Lipid Profile: No results for input(s): CHOL, HDL, LDLCALC, TRIG, CHOLHDL, LDLDIRECT in the last 72 hours. Thyroid Function Tests: No results for input(s): TSH, T4TOTAL, FREET4, T3FREE, THYROIDAB in the last 72 hours. Anemia Panel: No results for input(s): VITAMINB12, FOLATE, FERRITIN, TIBC, IRON, RETICCTPCT in the last 72 hours. Urine analysis:    Component Value Date/Time   COLORURINE YELLOW 10/04/2019 [place] 10/04/2019 1305   LABSPEC 1.004 (L) 10/04/2019 1305   PHURINE 6.0 10/04/2019 1305   GLUCOSEU NEGATIVE 10/04/2019 1305   [place] 10/04/2019 [place] 10/04/2019 1305   BILIRUBINUR Negative  08/07/2018 [place] 10/04/2019 1305   PROTEINUR NEGATIVE 10/04/2019 1305   UROBILINOGEN 0.2 08/07/2018 1437   UROBILINOGEN 1.0 10/16/2015 2009   NITRITE POSITIVE (A) 10/04/2019 1305   LEUKOCYTESUR NEGATIVE 10/04/2019 1305   Sepsis Labs: @LABRCNTIP (procalcitonin:4,lacticidven:4)  ) Recent Results (from the past 240 hour(s))  SARS Coronavirus 2 by RT PCR (hospital order, performed in [facility] hospital lab) Nasopharyngeal Nasopharyngeal Swab     Status: None   Collection Time: 06/14/20  2:03 PM   Specimen: Nasopharyngeal Swab  Result Value Ref Range Status   SARS Coronavirus 2 NEGATIVE NEGATIVE Final    Comment: (NOTE) SARS-CoV-2 target nucleic acids are NOT DETECTED.  The SARS-CoV-2 RNA is generally detectable in upper and lower respiratory specimens during the acute phase of infection. The lowest concentration of SARS-CoV-2 viral copies this assay can detect is 250 copies / mL. A negative result  does not preclude SARS-CoV-2 infection and should not be used as the sole basis for treatment or other patient management decisions.  A negative result may occur with improper specimen collection / handling, submission of specimen other than nasopharyngeal swab, presence of viral mutation(s) within the areas targeted by this assay, and inadequate number of viral copies (<250 copies / mL). A negative result must be combined with clinical observations, patient history, and epidemiological information.  Fact Sheet for Patients:   StrictlyIdeas.no  Fact Sheet for Healthcare Providers: BankingDealers.co.za  This test is not yet approved or  cleared by the [place] FDA and has been authorized for detection and/or diagnosis of SARS-CoV-2 by FDA under an Emergency Use Authorization (EUA).  This EUA will remain in effect (meaning this test can be used) for the duration of the COVID-19 declaration under Section 564(b)(1) of the Act, 21 U.S.C. section 360bbb-3(b)(1), unless the authorization is terminated or revoked sooner.  Performed at [facility], [place] [address] [place], [place] [address]       Studies: No results found.  Scheduled Meds: . aspirin EC  81 mg Oral QHS  . darifenacin  15 mg Oral Daily  . docusate sodium  100 mg Oral BID  . donepezil  10 mg Oral QHS  . DULoxetine  90 mg Oral Daily  . enoxaparin (LOVENOX) injection  40 mg Subcutaneous Q24H  . fluticasone  2 spray Each Nare Daily  . isosorbide mononitrate  30 mg Oral QPM  . isosorbide mononitrate  60 mg Oral Daily  . lamoTRIgine  150 mg Oral Daily  . methylPREDNISolone (SOLU-MEDROL) injection  40 mg Intravenous Q6H  . metoprolol succinate  50 mg Oral Daily  . pantoprazole  40 mg Oral BID AC  . rosuvastatin  20 mg Oral Daily  . traZODone  150 mg Oral QHS    Continuous Infusions:   LOS: 2 days     Alma Friendly, MD [facility]  If 7PM-7AM, please contact night-coverage [email or web address] 06/17/2020, 4:07 PM

## 2020-06-17 NOTE — Transfer of Care (Signed)
Immediate Anesthesia Transfer of Care Note  Patient: Linda [name]  Procedure(s) Performed: ESOPHAGOGASTRODUODENOSCOPY (EGD) WITH PROPOFOL (N/A ) [place]  Patient Location: PACU  Anesthesia Type:MAC  Level of Consciousness: awake, alert , oriented and patient cooperative  Airway & Oxygen Therapy: Patient Spontanous Breathing and Patient connected to face mask oxygen  Post-op Assessment: Report given to RN, Post -op Vital signs reviewed and stable and Patient moving all extremities X 4  Post vital signs: stable  Last Vitals:  Vitals Value Taken Time  BP 151/64 06/17/20 1147  Temp 36.7 C 06/17/20 1147  Pulse 64 06/17/20 1200  Resp 16 06/17/20 1200  SpO2 94 % 06/17/20 1200  Vitals shown include unvalidated device data.  Last Pain:  Vitals:   06/17/20 1000  TempSrc: Oral  PainSc: 6       Patients Stated Pain Goal: 2 (12/82/08 1388)  Complications: No complications documented.

## 2020-06-17 NOTE — Op Note (Signed)
[facility] Patient Name: Linda [name] Procedure Date: 06/17/2020 MRN: [id number] Attending MD: Carol Ada , MD Date of Birth: 1946/05/26 CSN: [id number] Age: 74 Admit Type: Inpatient Procedure:                Upper GI endoscopy Indications:              Dysphagia Providers:                Carol Ada, MD, Angus Seller, Laverda Sorenson,                            Technician, Tyrone Apple, Technician, Enrigue Catena, CRNA Referring MD:              Medicines:                 Complications:            No immediate complications. Estimated Blood Loss:     Estimated blood loss was minimal. Procedure:                Pre-Anesthesia Assessment:                           - Prior to the procedure, a History and Physical                            was performed, and patient medications and                            allergies were reviewed. The patient's tolerance of                            previous anesthesia was also reviewed. The risks                            and benefits of the procedure and the sedation                            options and risks were discussed with the patient.                            All questions were answered, and informed consent                            was obtained. Prior Anticoagulants: The patient has                            taken no previous anticoagulant or antiplatelet                            agents. ASA Grade Assessment: II - A patient with                            mild systemic disease. After reviewing  the risks                            and benefits, the patient was deemed in                            satisfactory condition to undergo the procedure.                           - Sedation was administered by an anesthesia                            professional. Deep sedation was attained.                           After obtaining informed consent, the endoscope was                             passed under direct vision. Throughout the                            procedure, the patient's blood pressure, pulse, and                            oxygen saturations were monitored continuously. The                            GIF-H190 ([id number]) Olympus gastroscope was                            introduced through the mouth, and advanced to the                            second part of duodenum. The upper GI endoscopy was                            accomplished without difficulty. The patient                            tolerated the procedure well. Scope In: Scope Out: Findings:      No endoscopic abnormality was evident in the esophagus to explain the       patient's complaint of dysphagia. It was decided, however, to proceed       with dilation of the entire esophagus. The scope was withdrawn. Dilation       was performed with a Maloney dilator with no resistance at 35 Fr. The       dilation site was examined following endoscope reinsertion and showed no       change. Estimated blood loss: none.      Diffuse mildly erythematous mucosa without bleeding was found in the       gastric body and in the gastric antrum. Biopsies were taken with a cold       forceps for histology.      The examined duodenum was normal.      Gross inspection of the vocal cords appeared to  be normal. It was widely       patent and there was no issue with stridor. Passage of the endoscope was       performed with ease. There was suggestion of an hypertensive UES or       stricturing in this area. Dilation with the [facility] 54 Fr dilator was       performed with ease and without any resistance. Impression:               - No endoscopic esophageal abnormality to explain                            patient's dysphagia. Esophagus dilated. Dilated.                           - Erythematous mucosa in the gastric body and                            antrum. Biopsied.                           - Normal examined  duodenum. Moderate Sedation:      Not Applicable - Patient had care per Anesthesia. Recommendation:           - Return patient to hospital ward for ongoing care.                           - Clear liquids and advance as tolerated.                           - Continue present medications.                           - Await pathology results. Procedure Code(s):        --- Professional ---                           [phone], Esophagogastroduodenoscopy, flexible,                            transoral; with biopsy, single or multiple                           43450, Dilation of esophagus, by unguided sound or                            bougie, single or multiple passes Diagnosis Code(s):        --- Professional ---                           R13.10, Dysphagia, unspecified                           K31.89, Other diseases of stomach and duodenum CPT copyright 2019 American Medical Association. All rights reserved. The codes documented in this report are preliminary and upon coder review may  be revised to meet current compliance requirements. Carol Ada, MD Carol Ada, MD 06/17/2020 11:48:15  AM This report has been signed electronically. Number of Addenda: 0

## 2020-06-17 NOTE — Anesthesia Procedure Notes (Signed)
Procedure Name: MAC Date/Time: 06/17/2020 11:21 AM Performed by: Lissa Morales, CRNA Pre-anesthesia Checklist: Patient identified, Emergency Drugs available, Suction available, Patient being monitored and Timeout performed Patient Re-evaluated:Patient Re-evaluated prior to induction Oxygen Delivery Method: Simple face mask Preoxygenation: POM mask. Placement Confirmation: positive ETCO2

## 2020-06-17 NOTE — Progress Notes (Signed)
PT Cancellation Note  Patient Details Name: Linda [name] MRN: [id number] DOB: Apr 14, 1946   Cancelled Treatment:    Reason Eval/Treat Not Completed: Patient at procedure or test/unavailable (endo)   Kenyon Ana 06/17/2020, 11:56 AM

## 2020-06-17 NOTE — Progress Notes (Signed)
Pt arrived back to room from ENDO, pt has c/o back pain as well as severe anxiety. Will continue to monitor.

## 2020-06-17 NOTE — Interval H&P Note (Signed)
History and Physical Interval Note:  06/17/2020 11:08 AM  Linda [name]  has presented today for surgery, with the diagnosis of dysphagia.  The various methods of treatment have been discussed with the patient and family. After consideration of risks, benefits and other options for treatment, the patient has consented to  Procedure(s): ESOPHAGOGASTRODUODENOSCOPY (EGD) WITH PROPOFOL (N/A) as a surgical intervention.  The patient's history has been reviewed, patient examined, no change in status, stable for surgery.  I have reviewed the patient's chart and labs.  Questions were answered to the patient's satisfaction.     Linda [name] D

## 2020-06-17 NOTE — Anesthesia Postprocedure Evaluation (Signed)
Anesthesia Post Note  Patient: [name]  Procedure(s) Performed: ESOPHAGOGASTRODUODENOSCOPY (EGD) WITH PROPOFOL (N/A ) [place]     Patient location during evaluation: PACU Anesthesia Type: MAC Level of consciousness: awake and alert Pain management: pain level controlled Vital Signs Assessment: post-procedure vital signs reviewed and stable Respiratory status: spontaneous breathing, nonlabored ventilation and respiratory function stable Cardiovascular status: stable and blood pressure returned to baseline Anesthetic complications: no   No complications documented.  Last Vitals:  Vitals:   06/17/20 1205 06/17/20 1216  BP: (!) 152/87 (!) 164/71  Pulse: 67 63  Resp: 13 12  Temp:  36.7 C  SpO2: 95% 98%                   Audry Pili

## 2020-06-18 LAB — CBC WITH DIFFERENTIAL/PLATELET
Abs Immature Granulocytes: 0.06 10*3/uL (ref 0.00–0.07)
Basophils Absolute: 0 10*3/uL (ref 0.0–0.1)
Basophils Relative: 0 %
Eosinophils Absolute: 0 10*3/uL (ref 0.0–0.5)
Eosinophils Relative: 0 %
HCT: 46.5 % — ABNORMAL HIGH (ref 36.0–46.0)
Hemoglobin: 14.7 g/dL (ref 12.0–15.0)
Immature Granulocytes: 1 %
Lymphocytes Relative: 13 %
Lymphs Abs: 1.4 10*3/uL (ref 0.7–4.0)
MCH: 28.8 pg (ref 26.0–34.0)
MCHC: 31.6 g/dL (ref 30.0–36.0)
MCV: 91 fL (ref 80.0–100.0)
Monocytes Absolute: 0.5 10*3/uL (ref 0.1–1.0)
Monocytes Relative: 5 %
Neutro Abs: 8.8 10*3/uL — ABNORMAL HIGH (ref 1.7–7.7)
Neutrophils Relative %: 81 %
Platelets: 202 10*3/uL (ref 150–400)
RBC: 5.11 MIL/uL (ref 3.87–5.11)
RDW: 13.2 % (ref 11.5–15.5)
WBC: 10.8 10*3/uL — ABNORMAL HIGH (ref 4.0–10.5)
nRBC: 0 % (ref 0.0–0.2)

## 2020-06-18 LAB — BASIC METABOLIC PANEL
Anion gap: 10 (ref 5–15)
BUN: 25 mg/dL — ABNORMAL HIGH (ref 8–23)
CO2: 25 mmol/L (ref 22–32)
Calcium: 8.2 mg/dL — ABNORMAL LOW (ref 8.9–10.3)
Chloride: 101 mmol/L (ref 98–111)
Creatinine, Ser: 0.74 mg/dL (ref 0.44–1.00)
GFR calc Af Amer: >60 mL/min (ref >60)
GFR calc non Af Amer: >60 mL/min (ref >60)
Glucose, Bld: 137 mg/dL — ABNORMAL HIGH (ref 70–99)
Potassium: 4.2 mmol/L (ref 3.5–5.1)
Sodium: 136 mmol/L (ref 135–145)

## 2020-06-18 MED ORDER — METHYLPREDNISOLONE SODIUM SUCC 40 MG IJ SOLR
40.0000 mg | Freq: Two times a day (BID) | INTRAMUSCULAR | Status: DC
  Administered 2020-06-19 (×2): 40 mg via INTRAVENOUS
  Filled 2020-06-18 (×2): qty 1

## 2020-06-18 NOTE — Evaluation (Signed)
Physical Therapy Evaluation Patient Details Name: Linda [name] MRN: [id number] DOB: 27-Aug-1946 Today's Date: 06/18/2020   History of Present Illness  74 y.o. female with past medical history of dementia, osteoporosis, osteoarthritis, [name], asbestosis, anxiety, fibromyalgia, recent diagnosis of dysphagia and likely vocal cord dysfunction. Patient presented with complaints of choking on food.  She was recently hospitalized on 06/14/2020 for the same.  Clinical Impression  Pt admitted with above diagnosis.  PT is very agreeable mobilizing with PT however is somewhat impulsive, unsteady with gait. Pt is however, likely close to her baseline and doubt she will need f/u post acute.   Pt currently with functional limitations due to the deficits listed below (see PT Problem List). Pt will benefit from skilled PT to increase their independence and safety with mobility to allow discharge to the venue listed below.       Follow Up Recommendations No PT follow up    Equipment Recommendations  None recommended by PT    Recommendations for Other Services       Precautions / Restrictions Precautions Precautions: Fall Precaution Comments: 2-3L O2 at baseline per pt Restrictions Weight Bearing Restrictions: No      Mobility  Bed Mobility Overal bed mobility: Needs Assistance Bed Mobility: Supine to Sit     Supine to sit: Supervision     General bed mobility comments: for safety, pt is mildly impulsive  Transfers Overall transfer level: Needs assistance Equipment used: None Transfers: Sit to/from Stand Sit to Stand: Supervision;Min guard         General transfer comment: for safety, unsteady with initial stand  Ambulation/Gait Ambulation/Gait assistance: Min guard;Min assist Gait Distance (Feet): 360 Feet Assistive device: None (pt pushing O2 tank per her request with poor safety awareness) Gait Pattern/deviations: Step-through pattern;Decreased stride length;Trunk  flexed;Drifts right/left     General Gait Details: O2 tank taken from pt. pt moves very quickly, decr safety awareness/decr anticipation of obstacles in hallway. LOB x2 with light min assist to recover  Stairs            Wheelchair Mobility    Modified Rankin (Stroke Patients Only)       Balance Overall balance assessment: Mild deficits observed, not formally tested                                           Pertinent Vitals/Pain Pain Assessment: No/denies pain    Home Living Family/patient expects to be discharged to:: Assisted living               Home Equipment: Kasandra Knudsen - single point;Wheelchair - manual;Shower seat;Hand held Tourist information centre manager - 4 wheels Additional Comments: pt denies amb with device (previous notes state pt amb with rollator)    Prior Function Level of Independence: Independent         Comments: see above     Hand Dominance        Extremity/Trunk Assessment   Upper Extremity Assessment Upper Extremity Assessment: Overall WFL for tasks assessed    Lower Extremity Assessment Lower Extremity Assessment: Overall WFL for tasks assessed    Cervical / Trunk Assessment Cervical / Trunk Assessment: Kyphotic  Communication   Communication: Expressive difficulties  Cognition Arousal/Alertness: Awake/alert Behavior During Therapy: Impulsive Overall Cognitive Status: History of cognitive impairments - at baseline  General Comments: hx demenita, follows commands consistently      General Comments      Exercises     Assessment/Plan    PT Assessment Patient needs continued PT services  PT Problem List Decreased balance;Decreased knowledge of use of DME;Decreased safety awareness;Decreased cognition       PT Treatment Interventions DME instruction;Therapeutic exercise;Functional mobility training;Therapeutic activities;Gait training;Balance training;Patient/family  education    PT Goals (Current goals can be found in the Care Plan section)  Acute Rehab PT Goals Patient Stated Goal: none stated PT Goal Formulation: With patient Time For Goal Achievement: 07/02/20 Potential to Achieve Goals: Good    Frequency Min 2X/week   Barriers to discharge        Co-evaluation               AM-PAC PT "6 Clicks" Mobility  Outcome Measure Help needed turning from your back to your side while in a flat bed without using bedrails?: A Little Help needed moving from lying on your back to sitting on the side of a flat bed without using bedrails?: None Help needed moving to and from a bed to a chair (including a wheelchair)?: A Little Help needed standing up from a chair using your arms (e.g., wheelchair or bedside chair)?: A Little Help needed to walk in hospital room?: A Little Help needed climbing 3-5 steps with a railing? : A Little 6 Click Score: 19    End of Session Equipment Utilized During Treatment: Gait belt Activity Tolerance: Patient tolerated treatment well Patient left: in chair;with call bell/phone within reach;with nursing/sitter in room (NT changing bed)   PT Visit Diagnosis: Difficulty in walking, not elsewhere classified (R26.2)    Time: 7680-8811 PT Time Calculation (min) (ACUTE ONLY): 14 min   Charges:   PT Evaluation $PT Eval Low Complexity: [place], PT  Acute Rehab Dept ([place]) [phone] Pager [phone]  06/18/2020   [facility] 06/18/2020, 4:01 PM

## 2020-06-18 NOTE — Progress Notes (Signed)
PROGRESS NOTE  Linda [name] DPO:[id number] DOB: 1946/04/18 DOA: 06/15/2020 PCP: Tammi Sou, MD  HPI/Recap of past 24 hours: HPI from Dr Phineas Real is a 74 y.o. female with past medical history of dementia, osteoporosis, osteoarthritis, [name], asbestosis, anxiety, fibromyalgia, recent diagnosis of dysphagia and likely vocal cord dysfunction. Patient presented with complaints of choking on the food.  She was recently hospitalized on 06/14/2020 for the same.  Pulmonary was consulted due to presence of the stridor.  Patient was able to swallow safely, stridor resolved and was subsequently discharged. While waiting for the ride back home she was eating a shredded chicken wrap and started having choking episode which led to severe chest pain for which she took 3 nitroglycerin and came back to the hospital.  In the ED, patient also noted to have some stridor/upper airway noises, patient was started on racemic epi and Decadron.  PCCM was consulted for possible bronchoscopy.  Patient admitted for further management.    Today, patient still reports feeling anxious, as well as chronic back pain.  Still anxious about feeding by mouth, denies any chest pain, shortness of breath, abdominal pain, nausea/vomiting.    Assessment/Plan: Principal Problem:   Stridor Active Problems:   HTN (hypertension), benign   Fibromyalgia syndrome   OSA (obstructive sleep apnea)   Chronic diastolic CHF, NYHA class 1   History of pulmonary embolism   Pernicious anemia   Anxiety and depression   RBBB   COPD (chronic obstructive pulmonary disease) (HCC)   Dysphagia   Possible stridor/esophageal impairment Possible vocal cord dysfunction Acute hypoxic respiratory failure Currently on about 2 L of O2, saturating well Chest x-ray, CT chest unremarkable for any evidence of aspiration ENT consulted performed flexible laryngoscopy on 6/23, reported normal upper airway evaluation.  No further  recommendation PCCM consulted, patient initially refused bronchoscopy (concerns for anesthesia), now reconsidering, will reconsult PCCM on 06/19/2020 SLP consulted, MBS done which showed patient at risk for food lodging in the cervical esophagus and recommended to avoid meats GI consulted, EGD done on 06/17/2020 showed no endoscopic abnormality to explain patient's complaint of dysphagia.  Gross inspection of the vocal cords appeared to be normal.  Dilatation of the entire esophagus was done Continue racemic epinephrine neb as needed, steroids for now Continue clear liquid diet  Leukocytosis Currently afebrile, likely 2/2 steroid use Monitor closely, Daily CBC  History of COPD/asbestosis Continue steroids for now Follow-up with outpatient pulmonologist  Chronic diastolic HF Appears compensated Continue home meds  Fibromyalgia/chronic pain syndrome/anxiety Continue home pain regimen Advised for risk of aspiration   GERD Continue PPI  History of dementia Continue home regimen       Malnutrition Type:      Malnutrition Characteristics:      Nutrition Interventions:       Estimated body mass index is 25.86 kg/m as calculated from the following:   Height as of this encounter: 5\' 3"  (1.6 m).   Weight as of this encounter: 66.2 kg.     Code Status: Full  Family Communication: Discussed extensively with patient  Disposition Plan: Status is: Inpatient  Remains inpatient appropriate because:Inpatient level of care appropriate due to severity of illness   Dispo: The patient is from: ILF              Anticipated d/c is to: ILF              Anticipated d/c date is: 1 day  Patient currently is not medically stable to d/c.    Consultants:  PCCM  GI  Procedures: EGD on 06/17/20  Antimicrobials:  None  DVT prophylaxis: Lovenox   Objective: Vitals:   06/17/20 1216 06/17/20 2009 06/18/20 0512 06/18/20 1341  BP: (!) 164/71 (!) 142/82 (!)  163/73 117/70  Pulse: 63 73 68 75  Resp: 12 20 20 18   Temp: 98.1 F (36.7 C) 97.7 F (36.5 C) (!) 97.5 F (36.4 C) (!) 97.5 F (36.4 C)  TempSrc: Oral Oral Oral Oral  SpO2: 98% 96% 100% 97%  Weight:      Height:        Intake/Output Summary (Last 24 hours) at 06/18/2020 1556 Last data filed at 06/18/2020 0500 Gross per 24 hour  Intake --  Output 825 ml  Net -825 ml   Filed Weights   06/16/20 1849 06/17/20 1000  Weight: 66.2 kg 66.2 kg    Exam:  General: NAD   Cardiovascular: S1, S2 present  Respiratory:  Diminished breath sounds bilaterally, noted some mild ?stridor  Abdomen: Soft, nontender, nondistended, bowel sounds present  Musculoskeletal: No bilateral pedal edema noted  Skin: Normal  Psychiatry:  Anxious    Data Reviewed: CBC: Recent Labs  Lab 06/14/20 1450 06/14/20 1450 06/15/20 0327 06/15/20 1710 06/16/20 0442 06/17/20 0418 06/18/20 0444  WBC 8.0   < > 7.9 13.8* 9.9 13.1* 10.8*  NEUTROABS 4.4  --   --   --   --  11.0* 8.8*  HGB 13.5   < > 13.6 14.6 15.2* 15.4* 14.7  HCT 41.4   < > 42.6 45.7 46.4* 48.7* 46.5*  MCV 91.2   < > 91.4 89.8 90.3 90.7 91.0  PLT 188   < > 191 215 203 211 202   < > = values in this interval not displayed.   Basic Metabolic Panel: Recent Labs  Lab 06/15/20 0327 06/15/20 1710 06/16/20 0442 06/17/20 0418 06/18/20 0444  NA 137 135 138 134* 136  K 4.3 4.1 4.0 4.1 4.2  CL 103 101 101 99 101  CO2 24 27 27 25 25   GLUCOSE 132* 108* 147* 133* 137*  BUN 17 20 21  26* 25*  CREATININE 0.75 0.82 0.72 0.66 0.74  CALCIUM 8.7* 8.8* 9.1 8.4* 8.2*   GFR: Estimated Creatinine Clearance: 56.4 mL/min (by C-G formula based on SCr of 0.74 mg/dL). Liver Function Tests: Recent Labs  Lab 06/16/20 0442  AST 25  ALT 26  ALKPHOS 61  BILITOT 0.5  PROT 7.6  ALBUMIN 4.2   No results for input(s): LIPASE, AMYLASE in the last 168 hours. No results for input(s): AMMONIA in the last 168 hours. Coagulation Profile: Recent Labs   Lab 06/16/20 0442  INR 1.0   Cardiac Enzymes: No results for input(s): CKTOTAL, CKMB, CKMBINDEX, TROPONINI in the last 168 hours. BNP (last 3 results) No results for input(s): PROBNP in the last 8760 hours. HbA1C: No results for input(s): HGBA1C in the last 72 hours. CBG: Recent Labs  Lab 06/16/20 1532  GLUCAP 137*   Lipid Profile: No results for input(s): CHOL, HDL, LDLCALC, TRIG, CHOLHDL, LDLDIRECT in the last 72 hours. Thyroid Function Tests: No results for input(s): TSH, T4TOTAL, FREET4, T3FREE, THYROIDAB in the last 72 hours. Anemia Panel: No results for input(s): VITAMINB12, FOLATE, FERRITIN, TIBC, IRON, RETICCTPCT in the last 72 hours. Urine analysis:    Component Value Date/Time   COLORURINE YELLOW 10/04/2019 [place] 10/04/2019 1305   LABSPEC 1.004 (L) 10/04/2019  [place] 6.0 10/04/2019 [place] 10/04/2019 [place] 10/04/2019 [place] 10/04/2019 1305   BILIRUBINUR Negative 08/07/2018 1437   [place] 10/04/2019 1305   PROTEINUR NEGATIVE 10/04/2019 1305   UROBILINOGEN 0.2 08/07/2018 1437   UROBILINOGEN 1.0 10/16/2015 2009   NITRITE POSITIVE (A) 10/04/2019 1305   LEUKOCYTESUR NEGATIVE 10/04/2019 1305   Sepsis Labs: @LABRCNTIP (procalcitonin:4,lacticidven:4)  ) Recent Results (from the past 240 hour(s))  SARS Coronavirus 2 by RT PCR (hospital order, performed in [place] hospital lab) Nasopharyngeal Nasopharyngeal Swab     Status: None   Collection Time: 06/14/20  2:03 PM   Specimen: Nasopharyngeal Swab  Result Value Ref Range Status   SARS Coronavirus 2 NEGATIVE NEGATIVE Final    Comment: (NOTE) SARS-CoV-2 target nucleic acids are NOT DETECTED.  The SARS-CoV-2 RNA is generally detectable in upper and lower respiratory specimens during the acute phase of infection. The lowest concentration of SARS-CoV-2 viral copies this assay can detect is 250 copies / mL. A negative  result does not preclude SARS-CoV-2 infection and should not be used as the sole basis for treatment or other patient management decisions.  A negative result may occur with improper specimen collection / handling, submission of specimen other than nasopharyngeal swab, presence of viral mutation(s) within the areas targeted by this assay, and inadequate number of viral copies (<250 copies / mL). A negative result must be combined with clinical observations, patient history, and epidemiological information.  Fact Sheet for Patients:   StrictlyIdeas.no  Fact Sheet for Healthcare Providers: BankingDealers.co.za  This test is not yet approved or  cleared by the [place] FDA and has been authorized for detection and/or diagnosis of SARS-CoV-2 by FDA under an Emergency Use Authorization (EUA).  This EUA will remain in effect (meaning this test can be used) for the duration of the COVID-19 declaration under Section 564(b)(1) of the Act, 21 U.S.C. section 360bbb-3(b)(1), unless the authorization is terminated or revoked sooner.  Performed at [facility], [place] [address] [place], [place] [address]       Studies: No results found.  Scheduled Meds: . aspirin EC  81 mg Oral QHS  . darifenacin  15 mg Oral Daily  . docusate sodium  100 mg Oral BID  . donepezil  10 mg Oral QHS  . DULoxetine  90 mg Oral Daily  . enoxaparin (LOVENOX) injection  40 mg Subcutaneous Q24H  . fluticasone  2 spray Each Nare Daily  . isosorbide mononitrate  30 mg Oral QPM  . isosorbide mononitrate  60 mg Oral Daily  . lamoTRIgine  150 mg Oral Daily  . [START ON 06/19/2020] methylPREDNISolone (SOLU-MEDROL) injection  40 mg Intravenous Q12H  . metoprolol succinate  50 mg Oral Daily  . pantoprazole  40 mg Oral BID AC  . rosuvastatin  20 mg Oral Daily  . traZODone  150 mg Oral QHS    Continuous Infusions:   LOS: 3 days     Alma Friendly, MD [facility]  If 7PM-7AM, please contact night-coverage [email or web address] 06/18/2020, 3:56 PM

## 2020-06-19 ENCOUNTER — Encounter (HOSPITAL_COMMUNITY): Payer: Self-pay | Admitting: Gastroenterology

## 2020-06-19 LAB — CBC WITH DIFFERENTIAL/PLATELET
Abs Immature Granulocytes: 0.03 10*3/uL (ref 0.00–0.07)
Basophils Absolute: 0 10*3/uL (ref 0.0–0.1)
Basophils Relative: 0 %
Eosinophils Absolute: 0 10*3/uL (ref 0.0–0.5)
Eosinophils Relative: 0 %
HCT: 47.2 % — ABNORMAL HIGH (ref 36.0–46.0)
Hemoglobin: 15.5 g/dL — ABNORMAL HIGH (ref 12.0–15.0)
Immature Granulocytes: 0 %
Lymphocytes Relative: 15 %
Lymphs Abs: 1.1 10*3/uL (ref 0.7–4.0)
MCH: 29 pg (ref 26.0–34.0)
MCHC: 32.8 g/dL (ref 30.0–36.0)
MCV: 88.4 fL (ref 80.0–100.0)
Monocytes Absolute: 0.4 10*3/uL (ref 0.1–1.0)
Monocytes Relative: 6 %
Neutro Abs: 6.2 10*3/uL (ref 1.7–7.7)
Neutrophils Relative %: 79 %
Platelets: 176 10*3/uL (ref 150–400)
RBC: 5.34 MIL/uL — ABNORMAL HIGH (ref 3.87–5.11)
RDW: 12.8 % (ref 11.5–15.5)
WBC: 7.8 10*3/uL (ref 4.0–10.5)
nRBC: 0 % (ref 0.0–0.2)

## 2020-06-19 MED ORDER — ALPRAZOLAM 1 MG PO TABS
1.0000 mg | ORAL_TABLET | Freq: Three times a day (TID) | ORAL | Status: DC | PRN
  Administered 2020-06-19 – 2020-06-20 (×3): 1 mg via ORAL
  Filled 2020-06-19 (×3): qty 1

## 2020-06-19 MED ORDER — PREDNISONE 20 MG PO TABS
40.0000 mg | ORAL_TABLET | Freq: Every day | ORAL | Status: DC
  Administered 2020-06-20: 40 mg via ORAL
  Filled 2020-06-19: qty 2

## 2020-06-19 MED ORDER — HYDRALAZINE HCL 20 MG/ML IJ SOLN
10.0000 mg | Freq: Three times a day (TID) | INTRAMUSCULAR | Status: DC | PRN
  Administered 2020-06-19: 10 mg via INTRAVENOUS
  Filled 2020-06-19: qty 1

## 2020-06-19 MED ORDER — NYSTATIN 100000 UNIT/ML MT SUSP
5.0000 mL | Freq: Four times a day (QID) | OROMUCOSAL | Status: DC
  Administered 2020-06-19 – 2020-06-20 (×4): 500000 [IU] via ORAL
  Filled 2020-06-19 (×4): qty 5

## 2020-06-19 NOTE — Progress Notes (Signed)
NAME:  Linda [name], MRN:  [id number], DOB:  [birth year], LOS: 4 ADMISSION DATE:  06/15/2020, CONSULTATION DATE: 6/23 REFERRING MD:  Dr. Maryan Rued, CHIEF COMPLAINT: Choking episode   Brief History   74 y/o F who presented to [facility] ER on 6/23 after a choking episode requiring abdominal thrusts.  Concern for retained chicken nugget.    History of present illness   74 y/o F, assisted living resident, who was eating lunch when she choked on a chicken nugget.  She required abdominal thrusts by staff but continued to have shortness of breath. On arrival to the ER, the patient reportedly had stridor with "gasping respirations". She was placed on a NRB for comfort (pt maintaining saturations).  Initial CXR showed known pleural plaques, no active disease.  ENT saw the patient in evaluation and assessed the upper airway due to upper airway noise which was negative.    PCCM called for evaluation of possible need for bronchoscopy.   Past Medical History  OSA on CPAP  COPD  Asbestos Exposure with Pleural Plaques Pernicious Anemia  PE - dx 2014 RBBB HTN Chronic Diastolic CHF Panic Attacks Neuroleptic induced Parkinsonism Idiopathic Angio-Edema Urticaria  Fibromyalgia  Anxiety UGIB - 2015  Significant Hospital Events   6/23 Admit for obs after chicken nugget choking episode. No bronch as pt improved and CT was ok.   6/24 Discharged and then immediatly readmit with stridor and chest pain.  Consults:  PCCM  Procedures:    Significant Diagnostic Tests:  CT Chest w/o Contrast 6/23 >> airways are clear with no findings of significant aspiration.  Asbestos related lung disease. EGD 6/26 > No endoscopic esophageal abnormality to explain patient's dysphagia. Esophagus dilated. Erythematous mucosa in the gastric body and antrum. Biopsied  Micro Data:  COVID 6/23 >> negative  Antimicrobials:     Interim history/subjective:  Reports breathing is still troublesome when she is exerting  herself and when she gets overwhelmed, but is overall much better thanks to steroids and breathing treatments.   Objective   Blood pressure (!) 159/80, pulse 84, temperature 97.8 F (36.6 C), temperature source Oral, resp. rate 20, height 5\' 3"  (1.6 m), weight 66.2 kg, SpO2 92 %.        Intake/Output Summary (Last 24 hours) at 06/19/2020 1326 Last data filed at 06/19/2020 0600 Gross per 24 hour  Intake --  Output 900 ml  Net -900 ml   Filed Weights   06/16/20 1849 06/17/20 1000  Weight: 66.2 kg 66.2 kg    Examination: General:  Elderly appearing female in NAD Neuro:  Alert, oriented, non-focal HEENT:  [place]/AT, No JVD noted, PERRL Cardiovascular:  RRR, no MRG Lungs:  Clear bilateral breath sounds. Upper airway wheeze only on forceful inspiration.  Abdomen:  Soft, non-distended,  Non-tender Musculoskeletal:  No acute deformity, no edema.  Skin:  Intact, MMM   Resolved Hospital Problem list     Assessment & Plan:   Choking Episode s/p Abdominal Thrusts Asthma without acute exacerbation Chronic hypoxemic respiratory failure (on 3L at home) Vocal cord dysfunction likely - Upper airway laryngoscopy evaluation per ENT without retained foreign body.   - CT chest negative for any aspiration  Plan: - Continue O2 at home flow rate 3L - DC duoneb, PRN albuterol - Transition steroids to prednisone for 5 day total course.  - No clear benefit from bronchoscopy - Restart home xanax 1mg  TID PRN - OOB to chair and push ambulation as tolerated.  - Incentive spirometry  [name] CT scan with pleural plaques and mild pulmonary fibrosis at the base.  She follows with Dr. Camillo Flaming pulmonary at [facility].      Georgann Housekeeper, AGACNP-BC [place]  See Amion for personal pager PCCM on call pager [phone]  06/19/2020 1:43 PM

## 2020-06-19 NOTE — Care Management Important Message (Signed)
Important Message  Patient Details IM Letter given to Evette Cristal SW Case Manager to present to the Patient Name: Linda [name] MRN: [id number] Date of Birth: November 16, 1946   Medicare Important Message Given:  Yes     Kerin Salen 06/19/2020, 10:49 AM

## 2020-06-19 NOTE — Progress Notes (Signed)
PROGRESS NOTE  Linda [name]:[id number] DOB: 02/12/1946 DOA: 06/15/2020 PCP: Tammi Sou, MD  HPI/Recap of past 24 hours: HPI from Dr Phineas Real is a 74 y.o. female with past medical history of dementia, osteoporosis, osteoarthritis, [name], asbestosis, anxiety, fibromyalgia, recent diagnosis of dysphagia and likely vocal cord dysfunction. Patient presented with complaints of choking on the food.  She was recently hospitalized on 06/14/2020 for the same.  Pulmonary was consulted due to presence of the stridor.  Patient was able to swallow safely, stridor resolved and was subsequently discharged. While waiting for the ride back home she was eating a shredded chicken wrap and started having choking episode which led to severe chest pain for which she took 3 nitroglycerin and came back to the hospital.  In the ED, patient also noted to have some stridor/upper airway noises, patient was started on racemic epi and Decadron.  PCCM was consulted for possible bronchoscopy.  Patient admitted for further management.    Today, patient denies any new complaints, reports anxiety and pain is under control.  Reports some burning in her mouth, unremarkable for significant thrush.  Patient denies any worsening shortness of breath, no further stridor noted, denies any chest pain, abdominal pain, nausea/vomiting, fever/chills.    Assessment/Plan: Principal Problem:   Stridor Active Problems:   HTN (hypertension), benign   Fibromyalgia syndrome   OSA (obstructive sleep apnea)   Chronic diastolic CHF, NYHA class 1   History of pulmonary embolism   Pernicious anemia   Anxiety and depression   RBBB   COPD (chronic obstructive pulmonary disease) (HCC)   Dysphagia   Possible stridor/esophageal impairment Possible vocal cord dysfunction Acute on chronic hypoxic respiratory failure Currently on about 2 L of O2, saturating well Chest x-ray, CT chest unremarkable for any evidence of  aspiration ENT consulted performed flexible laryngoscopy on 6/23, reported normal upper airway evaluation.  No further recommendation PCCM consulted, Dr. [place] Bells, does not recommend bronchoscopy as it may cause injury to the vocal cords, recommend speech therapy as outpatient, management of GERD and anxiety control SLP consulted, MBS done which showed patient at risk for food lodging in the cervical esophagus and recommended to avoid meats GI consulted, EGD done on 06/17/2020 showed no endoscopic abnormality to explain patient's complaint of dysphagia.  Gross inspection of the vocal cords appeared to be normal.  Dilatation of the entire esophagus was done Continue racemic epinephrine neb as needed, steroids for now Advance to soft diet  Leukocytosis Currently afebrile, likely 2/2 steroid use Monitor closely, Daily CBC  History of COPD/asbestosis Continue steroids for now Follow-up with outpatient pulmonologist  Chronic diastolic HF Appears compensated Continue home meds  Fibromyalgia/chronic pain syndrome/anxiety Continue home pain regimen Advised for risk of aspiration   GERD Continue PPI  History of dementia Continue home regimen       Malnutrition Type:      Malnutrition Characteristics:      Nutrition Interventions:       Estimated body mass index is 25.86 kg/m as calculated from the following:   Height as of this encounter: 5\' 3"  (1.6 m).   Weight as of this encounter: 66.2 kg.     Code Status: Full  Family Communication: Discussed extensively with patient  Disposition Plan: Status is: Inpatient  Remains inpatient appropriate because:Inpatient level of care appropriate due to severity of illness   Dispo: The patient is from: ILF  Anticipated d/c is to: ILF              Anticipated d/c date is: 1 day              Patient currently is not medically stable to d/c.    Consultants:  PCCM  GI  Procedures: EGD on  06/17/20  Antimicrobials:  None  DVT prophylaxis: Lovenox   Objective: Vitals:   06/19/20 0900 06/19/20 1025 06/19/20 1126 06/19/20 1426  BP: (!) 167/90 (!) 172/90 (!) 159/80 128/70  Pulse: 80 86 84 77  Resp:    17  Temp:    (!) 97.3 F (36.3 C)  TempSrc:    Oral  SpO2:    97%  Weight:      Height:        Intake/Output Summary (Last 24 hours) at 06/19/2020 1651 Last data filed at 06/19/2020 0600 Gross per 24 hour  Intake --  Output 900 ml  Net -900 ml   Filed Weights   06/16/20 1849 06/17/20 1000  Weight: 66.2 kg 66.2 kg    Exam:  General: NAD   Cardiovascular: S1, S2 present  Respiratory:  Diminished breath sounds bilaterally  Abdomen: Soft, nontender, nondistended, bowel sounds present  Musculoskeletal: No bilateral pedal edema noted  Skin: Normal  Psychiatry:  Anxious    Data Reviewed: CBC: Recent Labs  Lab 06/14/20 1450 06/15/20 0327 06/15/20 1710 06/16/20 0442 06/17/20 0418 06/18/20 0444 06/19/20 0437  WBC 8.0   < > 13.8* 9.9 13.1* 10.8* 7.8  NEUTROABS 4.4  --   --   --  11.0* 8.8* 6.2  HGB 13.5   < > 14.6 15.2* 15.4* 14.7 15.5*  HCT 41.4   < > 45.7 46.4* 48.7* 46.5* 47.2*  MCV 91.2   < > 89.8 90.3 90.7 91.0 88.4  PLT 188   < > 215 203 211 202 176   < > = values in this interval not displayed.   Basic Metabolic Panel: Recent Labs  Lab 06/15/20 0327 06/15/20 1710 06/16/20 0442 06/17/20 0418 06/18/20 0444  NA 137 135 138 134* 136  K 4.3 4.1 4.0 4.1 4.2  CL 103 101 101 99 101  CO2 24 27 27 25 25   GLUCOSE 132* 108* 147* 133* 137*  BUN 17 20 21  26* 25*  CREATININE 0.75 0.82 0.72 0.66 0.74  CALCIUM 8.7* 8.8* 9.1 8.4* 8.2*   GFR: Estimated Creatinine Clearance: 56.4 mL/min (by C-G formula based on SCr of 0.74 mg/dL). Liver Function Tests: Recent Labs  Lab 06/16/20 0442  AST 25  ALT 26  ALKPHOS 61  BILITOT 0.5  PROT 7.6  ALBUMIN 4.2   No results for input(s): LIPASE, AMYLASE in the last 168 hours. No results for  input(s): AMMONIA in the last 168 hours. Coagulation Profile: Recent Labs  Lab 06/16/20 0442  INR 1.0   Cardiac Enzymes: No results for input(s): CKTOTAL, CKMB, CKMBINDEX, TROPONINI in the last 168 hours. BNP (last 3 results) No results for input(s): PROBNP in the last 8760 hours. HbA1C: No results for input(s): HGBA1C in the last 72 hours. CBG: Recent Labs  Lab 06/16/20 1532  GLUCAP 137*   Lipid Profile: No results for input(s): CHOL, HDL, LDLCALC, TRIG, CHOLHDL, LDLDIRECT in the last 72 hours. Thyroid Function Tests: No results for input(s): TSH, T4TOTAL, FREET4, T3FREE, THYROIDAB in the last 72 hours. Anemia Panel: No results for input(s): VITAMINB12, FOLATE, FERRITIN, TIBC, IRON, RETICCTPCT in the last 72 hours. Urine analysis:  Component Value Date/Time   COLORURINE YELLOW 10/04/2019 [place] 10/04/2019 1305   LABSPEC 1.004 (L) 10/04/2019 1305   [place] 6.0 10/04/2019 1305   GLUCOSEU NEGATIVE 10/04/2019 1305   HGBUR NEGATIVE 10/04/2019 [place] 10/04/2019 1305   BILIRUBINUR Negative 08/07/2018 1437   KETONESUR NEGATIVE 10/04/2019 1305   PROTEINUR NEGATIVE 10/04/2019 1305   UROBILINOGEN 0.2 08/07/2018 1437   UROBILINOGEN 1.0 10/16/2015 2009   NITRITE POSITIVE (A) 10/04/2019 1305   LEUKOCYTESUR NEGATIVE 10/04/2019 1305   Sepsis Labs: @LABRCNTIP (procalcitonin:4,lacticidven:4)  ) Recent Results (from the past 240 hour(s))  SARS Coronavirus 2 by RT PCR (hospital order, performed in [place] hospital lab) Nasopharyngeal Nasopharyngeal Swab     Status: None   Collection Time: 06/14/20  2:03 PM   Specimen: Nasopharyngeal Swab  Result Value Ref Range Status   SARS Coronavirus 2 NEGATIVE NEGATIVE Final    Comment: (NOTE) SARS-CoV-2 target nucleic acids are NOT DETECTED.  The SARS-CoV-2 RNA is generally detectable in upper and lower respiratory specimens during the acute phase of infection. The lowest concentration of  SARS-CoV-2 viral copies this assay can detect is 250 copies / mL. A negative result does not preclude SARS-CoV-2 infection and should not be used as the sole basis for treatment or other patient management decisions.  A negative result may occur with improper specimen collection / handling, submission of specimen other than nasopharyngeal swab, presence of viral mutation(s) within the areas targeted by this assay, and inadequate number of viral copies (<250 copies / mL). A negative result must be combined with clinical observations, patient history, and epidemiological information.  Fact Sheet for Patients:   StrictlyIdeas.no  Fact Sheet for Healthcare Providers: BankingDealers.co.za  This test is not yet approved or  cleared by the [place] FDA and has been authorized for detection and/or diagnosis of SARS-CoV-2 by FDA under an Emergency Use Authorization (EUA).  This EUA will remain in effect (meaning this test can be used) for the duration of the COVID-19 declaration under Section 564(b)(1) of the Act, 21 U.S.C. section 360bbb-3(b)(1), unless the authorization is terminated or revoked sooner.  Performed at [facility], [place] [address], [place], [place] [address]       Studies: No results found.  Scheduled Meds: . aspirin EC  81 mg Oral QHS  . darifenacin  15 mg Oral Daily  . docusate sodium  100 mg Oral BID  . donepezil  10 mg Oral QHS  . DULoxetine  90 mg Oral Daily  . enoxaparin (LOVENOX) injection  40 mg Subcutaneous Q24H  . fluticasone  2 spray Each Nare Daily  . isosorbide mononitrate  30 mg Oral QPM  . isosorbide mononitrate  60 mg Oral Daily  . lamoTRIgine  150 mg Oral Daily  . metoprolol succinate  50 mg Oral Daily  . nystatin  5 mL Oral QID  . pantoprazole  40 mg Oral BID AC  . [START ON 06/20/2020] predniSONE  40 mg Oral Q breakfast  . rosuvastatin  20 mg Oral Daily  . traZODone  150 mg  Oral QHS    Continuous Infusions:   LOS: 4 days     Alma Friendly, MD [facility]  If 7PM-7AM, please contact night-coverage [email or web address] 06/19/2020, 4:51 PM

## 2020-06-20 ENCOUNTER — Other Ambulatory Visit: Payer: Self-pay

## 2020-06-20 LAB — SURGICAL PATHOLOGY

## 2020-06-20 LAB — GLUCOSE, CAPILLARY: Glucose-Capillary: 92 mg/dL (ref 70–99)

## 2020-06-20 MED ORDER — DOCUSATE SODIUM 100 MG PO CAPS: 100.0000 mg | ORAL_CAPSULE | Freq: Two times a day (BID) | ORAL | 0 refills | Status: DC

## 2020-06-20 MED ORDER — PREDNISONE 20 MG PO TABS: 40.0000 mg | ORAL_TABLET | Freq: Every day | ORAL | 0 refills | Status: AC

## 2020-06-20 MED ORDER — PANTOPRAZOLE SODIUM 40 MG PO TBEC: 40.0000 mg | DELAYED_RELEASE_TABLET | Freq: Two times a day (BID) | ORAL | 0 refills | Status: DC

## 2020-06-20 MED ORDER — NYSTATIN 100000 UNIT/ML MT SUSP: 5.0000 mL | Freq: Four times a day (QID) | OROMUCOSAL | 0 refills | Status: DC

## 2020-06-20 NOTE — Discharge Summary (Signed)
Discharge Summary  Linda [name] YJE:[id number] DOB: 1946/08/03  PCP: Tammi Sou, MD  Admit date: 06/15/2020 Discharge date: 06/20/2020  Time spent: 40 mins  Recommendations for Outpatient Follow-up:  1. Follow-up with PCP in 1 week with repeat labs 2. Follow-up with pulmonologist as scheduled    Discharge Diagnoses:  Active Hospital Problems   Diagnosis Date Noted  . Stridor 06/15/2020  . Dysphagia 06/15/2020  . COPD (chronic obstructive pulmonary disease) ([place]) 01/21/2018  . RBBB 09/23/2016  . Anxiety and depression 11/21/2014  . Pernicious anemia 08/24/2014  . History of pulmonary embolism 07/17/2014  . OSA (obstructive sleep apnea) 04/24/2014  . Chronic diastolic CHF, NYHA class 1 04/24/2014  . Fibromyalgia syndrome 03/01/2013  . HTN (hypertension), benign 10/25/2012    Resolved Hospital Problems  No resolved problems to display.    Discharge Condition: Stable  Diet recommendation: Soft diet, avoid meats  Vitals:   06/19/20 2054 06/20/20 0509  BP: 127/72 121/72  Pulse: 70 67  Resp: 20 18  Temp: 97.8 F (36.6 C) 98.2 F (36.8 C)  SpO2: 99% 98%    History of present illness:  Linda [name] Maloneis a 74 y.o.femalewith past medical history ofdementia, osteoporosis, osteoarthritis, [name], asbestosis, anxiety, fibromyalgia, recent diagnosis of dysphagia and likely vocal cord dysfunction. Patient presented with complaints of choking on the food. She was recently hospitalized on 06/14/2020 for the same. Pulmonary was consulted due to presence of the stridor. Patient was able to swallow safely, stridor resolved and was subsequently discharged. While waiting for the ride back home she was eating a shredded chicken wrap and started having choking episode which led to severe chest pain for which she took 3 nitroglycerin and came back to the hospital.  In the ED, patient also noted to have some stridor/upper airway noises, patient was started on racemic epi and  Decadron.  PCCM was consulted for possible bronchoscopy.  Patient admitted for further management.    Today, patient denies any new complaints, any worsening shortness of breath, no further stridor or any difficulties with breathing noted, denies any chest pain, abdominal pain, nausea/vomiting, fever/chills.  Patient was able to tolerate her soft diet this morning for breakfast without any issues.  Eager to be discharged.  Stable to be discharged to follow-up with primary care in 1 week and pulmonology as scheduled.    Hospital Course:  Principal Problem:   Stridor Active Problems:   HTN (hypertension), benign   Fibromyalgia syndrome   OSA (obstructive sleep apnea)   Chronic diastolic CHF, NYHA class 1   History of pulmonary embolism   Pernicious anemia   Anxiety and depression   RBBB   COPD (chronic obstructive pulmonary disease) (HCC)   Dysphagia   Possible stridor/esophageal impairment Possible vocal cord dysfunction Acute on chronic hypoxic respiratory failure Currently on baseline 3 L of O2, saturating well Chest x-ray, CT chest unremarkable for any evidence of aspiration ENT consulted performed flexible laryngoscopy on 6/23, reported normal upper airway evaluation.  No further recommendation PCCM consulted, Dr. Lake Bells, does not recommend bronchoscopy as it may cause injury to the vocal cords, recommend speech therapy as outpatient, management of GERD and anxiety control SLP consulted, MBS done which showed patient at risk for food lodging in the cervical esophagus and recommended to avoid meats, only soft diet GI consulted, EGD done on 06/17/2020 showed no endoscopic abnormality to explain patient's complaint of dysphagia.  Gross inspection of the vocal cords appeared to be normal.  Dilatation of the entire  esophagus was done Continue steroids for another 2 days to complete 5 days of therapy Continue soft diet, avoid meat Follow up with PCP and  pulmonologist  Leukocytosis Currently afebrile, likely 2/2 steroid use  History of COPD/asbestosis Continue steroids as mentioned above Follow-up with outpatient pulmonologist  Chronic diastolic HF Appears compensated Continue home meds  Fibromyalgia/chronic pain syndrome/anxiety Continue home pain regimen Advised for risk of aspiration   GERD Continue PPI  History of dementia Continue home regimen       Malnutrition Type:      Malnutrition Characteristics:      Nutrition Interventions:      Estimated body mass index is 25.86 kg/m as calculated from the following:   Height as of this encounter: 5\' 3"  (1.6 m).   Weight as of this encounter: 66.2 kg.    Procedures:  EGD on 06/17/2020  Consultations:  PCCM  GI    Discharge Exam: BP 121/72 (BP Location: Left Arm)   Pulse 67   Temp 98.2 F (36.8 C)   Resp 18   Ht 5\' 3"  (1.6 m)   Wt 66.2 kg   SpO2 98%   BMI 25.86 kg/m   General: NAD Cardiovascular: S1, S2 present Respiratory: Managed breath sounds bilaterally, no stridor or wheezing noted  Discharge Instructions You were cared for by a hospitalist during your hospital stay. If you have any questions about your discharge medications or the care you received while you were in the hospital after you are discharged, you can call the unit and asked to speak with the hospitalist on call if the hospitalist that took care of you is not available. Once you are discharged, your primary care physician will handle any further medical issues. Please note that NO REFILLS for any discharge medications will be authorized once you are discharged, as it is imperative that you return to your primary care physician (or establish a relationship with a primary care physician if you do not have one) for your aftercare needs so that they can reassess your need for medications and monitor your lab values.  Discharge Instructions    Diet - low sodium heart healthy    Complete by: As directed    Increase activity slowly   Complete by: As directed      Allergies as of 06/20/2020      Reactions   Abilify [aripiprazole] Other (See Comments)   parkinsonism   Penicillins Itching, Swelling, Rash   Tolerated Cefepime in ED. Has patient had a PCN reaction causing immediate rash, facial/tongue/throat swelling, SOB or lightheadedness with hypotension: Yes Has patient had a PCN reaction causing severe rash involving mucus membranes or skin necrosis: No Has patient had a PCN reaction that required hospitalization: No  Has patient had a PCN reaction occurring within the last 10 years: No      Medication List    TAKE these medications   albuterol 108 (90 Base) MCG/ACT inhaler Commonly known as: VENTOLIN HFA Inhale 1-2 puffs into the lungs every 6 (six) hours as needed for wheezing or shortness of breath.   alendronate 70 MG tablet Commonly known as: FOSAMAX Take 1 tablet (70 mg total) by mouth every 7 (seven) days. Take with a full glass of water on an empty stomach, first thing in the morning and remain up right for 30 minutes after taking.   ALPRAZolam 1 MG tablet Commonly known as: XANAX TAKE 1 TABLET THREE TIMES DAILY AS NEEDED FOR ANXIETY. What changed:   how much  to take  how to take this  when to take this  reasons to take this  additional instructions   aspirin 81 MG tablet Take 81 mg by mouth at bedtime.   cyclobenzaprine 5 MG tablet Commonly known as: FLEXERIL Take 1 tablet (5 mg total) by mouth 3 (three) times daily as needed for muscle spasms.   docusate sodium 100 MG capsule Commonly known as: COLACE Take 1 capsule (100 mg total) by mouth 2 (two) times daily.   donepezil 10 MG tablet Commonly known as: Aricept Take 1 tablet (10 mg total) by mouth at bedtime.   DULoxetine 30 MG capsule Commonly known as: CYMBALTA TAKE 1 CAPSULE ONCE DAILY ALONG WITH 60MG  CAPSULE FOR TOTAL 90MG  DAILY. What changed:   See the new  instructions.  Another medication with the same name was removed. Continue taking this medication, and follow the directions you see here.   EPINEPHrine 0.3 mg/0.3 mL Soaj injection Commonly known as: EPI-PEN Inject 0.3 mLs (0.3 mg total) into the muscle as needed for anaphylaxis.   fluconazole 100 MG tablet Commonly known as: DIFLUCAN TAKE (1) TABLET DAILY AS NEEDED FOR THRUSH What changed: See the new instructions.   fluticasone 50 MCG/ACT nasal spray Commonly known as: FLONASE Place 2 sprays into both nostrils daily.   furosemide 40 MG tablet Commonly known as: LASIX Take 1 tablet (40 mg total) by mouth daily as needed for fluid.   ipratropium 0.03 % nasal spray Commonly known as: ATROVENT Place 2 sprays into both nostrils every 12 (twelve) hours.   isosorbide mononitrate 30 MG 24 hr tablet Commonly known as: IMDUR TAKE 2 TABLETS IN THE MORNING AND 1 TABLET IN THE EVENING. What changed:   how much to take  how to take this  when to take this  additional instructions   lamoTRIgine 150 MG tablet Commonly known as: LAMICTAL TAKE 1 TABLET EACH DAY. What changed: See the new instructions.   metoprolol succinate 50 MG 24 hr tablet Commonly known as: TOPROL-XL TAKE (1) TABLET DAILY IN THE MORNING. What changed:   how much to take  how to take this  when to take this  additional instructions   multivitamin with minerals Tabs tablet Take 1 tablet by mouth daily.   nitroGLYCERIN 0.4 MG SL tablet Commonly known as: NITROSTAT Place 1 tablet (0.4 mg total) under the tongue every 5 (five) minutes as needed for chest pain (x 3 doses).   nystatin 100000 UNIT/ML suspension Commonly known as: MYCOSTATIN Take 5 mLs (500,000 Units total) by mouth 4 (four) times daily.   ondansetron 8 MG tablet Commonly known as: Zofran Take 1 tablet (8 mg total) by mouth every 8 (eight) hours as needed for nausea or vomiting.   oxyCODONE-acetaminophen 10-325 MG tablet Commonly  known as: PERCOCET Every 6 hours as needed What changed:   how much to take  how to take this  when to take this  reasons to take this   pantoprazole 40 MG tablet Commonly known as: PROTONIX Take 1 tablet (40 mg total) by mouth 2 (two) times daily. What changed: when to take this   predniSONE 20 MG tablet Commonly known as: DELTASONE Take 2 tablets (40 mg total) by mouth daily with breakfast for 2 days. Start taking on: June 21, 2020   rosuvastatin 20 MG tablet Commonly known as: CRESTOR Take 1 tablet (20 mg total) by mouth daily.   solifenacin 10 MG tablet Commonly known as: VESIcare Take 1 tablet (10 mg  total) by mouth daily.   traZODone 50 MG tablet Commonly known as: DESYREL TAKE 2-4 TABLETS AT BEDTIME What changed: See the new instructions.      Allergies  Allergen Reactions  . Abilify [Aripiprazole] Other (See Comments)    parkinsonism  . Penicillins Itching, Swelling and Rash    Tolerated Cefepime in ED. Has patient had a PCN reaction causing immediate rash, facial/tongue/throat swelling, SOB or lightheadedness with hypotension: Yes Has patient had a PCN reaction causing severe rash involving mucus membranes or skin necrosis: No Has patient had a PCN reaction that required hospitalization: No  Has patient had a PCN reaction occurring within the last 10 years: No     Follow-up Information    McGowen, Adrian Blackwater, MD. Schedule an appointment as soon as possible for a visit in 1 week(s).   Specialty: Family Medicine Contact information: [address] [place] [address] [phone]                The results of significant diagnostics from this hospitalization (including imaging, microbiology, ancillary and laboratory) are listed below for reference.    Significant Diagnostic Studies: DG Neck Soft Tissue  Result Date: 06/14/2020 CLINICAL DATA:  Choking episode. EXAM: NECK SOFT TISSUES - 1+ VIEW COMPARISON:  Chest x-ray dated December 06, 2019. CT cervical spine dated October 21, 2017. FINDINGS: There is no evidence of retropharyngeal soft tissue swelling or epiglottic enlargement. The cervical airway is unremarkable and no radio-opaque foreign body identified. Bilateral carotid artery calcific atherosclerosis. Bilateral calcified pleural plaques in the lungs again noted. IMPRESSION: 1. Negative.  No radiopaque foreign body identified. Electronically Signed   By: Titus Dubin M.D.   On: 06/14/2020 14:39   DG Chest 2 View  Result Date: 06/15/2020 CLINICAL DATA:  Chest pain. EXAM: CHEST - 2 VIEW COMPARISON:  CT chest and chest x-ray from yesterday. FINDINGS: The heart size and mediastinal contours are within normal limits. Normal pulmonary vascularity. Bilateral calcified pleural plaques again noted. No focal consolidation, pleural effusion, or pneumothorax. Multiple thoracic compression deformities again noted. Oral contrast within the visualized bowel. IMPRESSION: 1. No acute cardiopulmonary disease. Electronically Signed   By: Titus Dubin M.D.   On: 06/15/2020 17:35   CT CHEST WO CONTRAST  Result Date: 06/14/2020 CLINICAL DATA:  74 year old female with history of aspiration. EXAM: CT CHEST WITHOUT CONTRAST TECHNIQUE: Multidetector CT imaging of the chest was performed following the standard protocol without IV contrast. COMPARISON:  Chest CT 01/01/2016. FINDINGS: Cardiovascular: Heart size is normal. There is no significant pericardial fluid, thickening or pericardial calcification. There is aortic atherosclerosis, as well as atherosclerosis of the great vessels of the mediastinum and the coronary arteries, including calcified atherosclerotic plaque in the left main, left anterior descending, left circumflex and right coronary arteries. Mediastinum/Nodes: No pathologically enlarged mediastinal or hilar lymph nodes. Please note that accurate exclusion of hilar adenopathy is limited on noncontrast CT scans. Esophagus is unremarkable  in appearance. No axillary lymphadenopathy. Lungs/Pleura: Numerous calcified pleural plaques bilaterally, indicative of asbestos related pleural disease. No pleural effusions. No suspicious appearing pulmonary nodules or masses are noted. No acute consolidative airspace disease. Some peripheral predominant areas of ground-glass attenuation and parenchymal banding throughout the mid to lower lungs bilaterally. No honeycombing. Upper Abdomen: Aortic atherosclerosis. Musculoskeletal: New compression fractures of T7 and T9, most severe at T9 with 40% loss of anterior vertebral body height. There are no aggressive appearing lytic or blastic lesions noted in the visualized portions of  the skeleton. IMPRESSION: 1. No imaging findings to suggest significant aspiration. 2. Asbestos related pleural disease with imaging findings in the lung bases bilaterally concerning for developing interstitial lung disease such as asbestosis. Outpatient referral to Pulmonology for appropriate follow-up is recommended. 3. Aortic atherosclerosis, in addition to left main and 3 vessel coronary artery disease. Assessment for potential risk factor modification, dietary therapy or pharmacologic therapy may be warranted, if clinically indicated. 4. New compression fractures of T7 and T9, most severe at T9 where there is 40% loss of anterior vertebral body height. Aortic Atherosclerosis (ICD10-I70.0). Electronically Signed   By: Vinnie Langton M.D.   On: 06/14/2020 16:17   DG Chest Portable 1 View  Result Date: 06/14/2020 CLINICAL DATA:  Choking episode today. EXAM: PORTABLE CHEST 1 VIEW COMPARISON:  Single-view of the chest 12/06/2019. FINDINGS: Calcified pleural plaques are again seen. Lungs are clear. Heart size is enlarged. Aortic atherosclerosis. No pneumothorax or pleural fluid. IMPRESSION: No acute disease. Calcified pleural plaques consistent with prior asbestos exposure. Cardiomegaly. Aortic Atherosclerosis (ICD10-I70.0).  Electronically Signed   By: Inge Rise M.D.   On: 06/14/2020 14:37   DG Swallowing Func-Speech Pathology  Result Date: 06/16/2020 Objective Swallowing Evaluation: Type of Study: MBS-Modified Barium Swallow Study  Patient Details Name: SHWETA AMAN MRN: [id number] Date of Birth: 03/24/1946 Today's Date: 06/16/2020 Time: SLP Start Time (ACUTE ONLY): 1326 -SLP Stop Time (ACUTE ONLY): 1340 SLP Time Calculation (min) (ACUTE ONLY): 14 min Past Medical History: Past Medical History: Diagnosis Date . Acute upper GI bleed 06/2014  while pt taking coumadin, plavix, and meloxicam---despite being told not to take coumadin. . Anginal pain ([place])   Nonobstructive CAD 2014; however, her cardiologist put her on a statin for this and NOT for hyperlipidemia per pt report.  Atyp CP 08/2017 at card f/u, plan for myoc perf imaging. . Anxiety   panic attacks . Asthma   w/ asbestososis  . BPPV (benign paroxysmal positional vertigo) 12/16/2012 . Chronic diastolic CHF (congestive heart failure) (H[facility])   dry wt as of 11/06/16 is 168 lbs. . Chronic lower back pain  . COPD (chronic obstructive pulmonary disease) ([place])  . DDD (degenerative disc disease)   lumbar and cervical.  . Diverticular disease  . Fibromyalgia   Patient states dx was around her late 29s but she had sx's for years prior to this. . H/O hiatal hernia  . History of pneumonia   hospitalized 12/2011, 02/2013, and 07/2013 [facility]) for this . HTN (hypertension)   Renal artery dopplers 04/2013 neg for stenosis. . Hypervitaminosis D 09/27/2019  over-supplemented.  Stopped vit D and plan recheck 2 mo. . Idiopathic angio-edema-urticaria [id number]; 2021  Angioedema component was very minimal.  2021->Dr. Bobbitt (allergist) eval. . Insomnia  . Iron deficiency anemia   Hematologist in [place], [place] did extensive w/u; no cause found; failed oral supplement;; gets fairly regular (q20m or so) IV iron infusions (Venofer -iron sucrose- 200mg  with procrit.  "for 14 yr I've been getting blood  work q month & getting infusions prn" (07/12/2013).  Dr. Marin Olp locally, iron infusions done, EPO deficiency dx'd . Migraine syndrome   "not as often anymore; used to be ~ q wk" (07/12/2013) . Mixed incontinence urge and stress  . Nephrolithiasis   "passed all on my own or they are still in there" (07/12/2013) . Neuroleptic induced Parkinsonism ([place]) 2018  Dr. Carles Collet, neuro, saw her 11/24/17 and recommended d/c of abilify as first step.  D/c'd abilify and pt got complete recovery. [name] Kitchen OSA  on CPAP   prior to move to [place]--had another sleep study 10/2015 w/pulm Dr. Camillo Flaming. . Osteoarthritis   "severe; progressing fast" (07/12/2013); multiple joints-not surgical candidate for TKR (03/2015).  Triamcinolon knee injections by Dr. Tessa Lerner 12/2017. [name] Kitchen Pernicious anemia 08/24/2014 . Pleural plaque with presence of asbestos 07/22/2013 . Pulmonary embolism ([place]) 07/2013  Dx at [facility] with very small peripheral upper lobe pe 07/2013: pt took coumadin for about 8-9 mo . Pyelonephritis   "several times over the last 30 yr" (07/12/2013) . RBBB (right bundle branch block)  . Recurrent major depression ([place])  . Recurrent UTI   hx of hospitalization for pyelonephritis; started abx prophylaxis 06/2015 . Syncope   Hypotensive; ED visit--Dr. Terrence Dupont did Cath--nonobstructive CAD, EF 55-60%.  In retrospect, suspect pt rx med misuse/polypharmacy Past Surgical History: Past Surgical History: Procedure Laterality Date . APPENDECTOMY  1960 . AXILLARY SURGERY Left 1978  Multiple "lump" in armpit per pt . CARDIAC CATHETERIZATION  01/2013  nonobstructive CAD, EF 55-60% . CARDIOVASCULAR STRESS TEST  02/22/15  Low risk myocard perf imaging; wall motion normal, normal EF . carotid duplex doppler  10/21/2017  R vertebral flow suggestive of possible distal obstruction.  Pt declined further w/u as of 10/29/17 but need to revisit this problem periodically. . COCCYX REMOVAL  1972 . DEXA  06/05/2017  T-score -3.1 . DILATION AND CURETTAGE OF UTERUS  ? 1970's .  ESOPHAGOGASTRODUODENOSCOPY N/A 07/19/2014  Gastritis found + in the setting of supratherapeutic INR, +plavix, + meloxicam. . EYE SURGERY Left 2012-2013  "injections for ~ 1 yr; don't really know what for" (07/12/2013) . HEEL SPUR SURGERY Left 2008 . KNEE SURGERY  2005 . LEFT HEART CATHETERIZATION WITH CORONARY ANGIOGRAM N/A 01/30/2013  Procedure: LEFT HEART CATHETERIZATION WITH CORONARY ANGIOGRAM;  Surgeon: Clent Demark, MD;  Location[facility] C[facility] LAB;  Service: Cardiovascular;  Laterality: N/A; . PLANTAR FASCIA RELEASE Left 2008 . SPIROMETRY  04/25/14  In hosp for acute asthma/COPD flare: mixed obstructive and restrictive lung disease. The FEV1 is severely reduced at 45% predicted.  FEV1 signif decreased compared to prior spirometry 07/23/13. . TENDON RELEASE  1996  Right forearm and hand . TOTAL ABDOMINAL HYSTERECTOMY  1974 . TRANSTHORACIC ECHOCARDIOGRAM  01/2013; 04/2014;08/2015; 09/2017  2014--NORMAL.  2015--focal basal septal hypertrophy, EF 55-60%, grade I diast dysfxn, mild LAE.  08/2015 EF 55-60%, nl LV syst fxn, grade I DD, valves wnl. 10/21/17: EF 65-70%, grd I DD, o/w normal. HPI: 74 y/o F who presented to [facility] ER on 6/23 after a choking episode requiring abdominal thrusts.  Concern for retained chicken nugget.  ENT finding negative for Upper airway obstruction, pulmonology reports pts stridor has resolved and bronch not needed.  medical history significant of idiopathic angioedema, nonobstructive CAD, chronic diastolic congestive heart failure, COPD on 3 L home oxygen, PE, neuroleptic induced parkinsonism, hypertension, fibromyalgia, anxiety, asthma. Was seen early in 2021 for idiopathin angioedema of lips tongue causing some acute dysphagia which resolved quickly. Pt able to consume regular solids by the time of d/c. Upper endoscopy in 2015 showed some gastritis, but no stricture.  No data recorded Assessment / Plan / Recommendation CHL IP CLINICAL IMPRESSIONS 06/15/2020 Clinical Impression Pt demonstrates a mild  cervical esophageal dysphagia with appearance of decreased relaxation of cricopharyngeus muscle. Pt had one instance of traace frank flash penetration when liquid hit closed UES and was propelled into pharynx. Pt had trace residue post swallwo that she sensed and cleared with a second swallow. In general her behavior and function was much  more controlled during exam than in the room. Esophageal sweep revealed open esophageal colum and distal residue, no radiologist present to confirm. Pt may be at ongoing risk of food lodging in the cervical esophagus. Recommend pt avoid meats for a time. She is otherwise safe to continue with thin liquids and soft chewable solids. Discussed with pt. May need to f/u with GI.  SLP Visit Diagnosis Dysphagia, oropharyngeal phase (R13.12) Attention and concentration deficit following -- Frontal lobe and executive function deficit following -- Impact on safety and function Moderate aspiration risk   CHL IP TREATMENT RECOMMENDATION 06/15/2020 Treatment Recommendations Therapy as outlined in treatment plan below   Prognosis 01/21/2020 Prognosis for Safe Diet Advancement Good Barriers to Reach Goals -- Barriers/Prognosis Comment -- CHL IP DIET RECOMMENDATION 06/15/2020 SLP Diet Recommendations Thin liquid;Dysphagia 3 (Mech soft) solids Liquid Administration via Cup;Straw Medication Administration Crushed with puree Compensations Slow rate;Small sips/bites Postural Changes Remain semi-upright after after feeds/meals (Comment);Seated upright at 90 degrees   CHL IP OTHER RECOMMENDATIONS 06/15/2020 Recommended Consults Consider GI evaluation Oral Care Recommendations Oral care BID Other Recommendations --   CHL IP FOLLOW UP RECOMMENDATIONS 06/15/2020 Follow up Recommendations Home health SLP   CHL IP FREQUENCY AND DURATION 01/21/2020 Speech Therapy Frequency (ACUTE ONLY) min 2x/week Treatment Duration --      CHL IP ORAL PHASE 06/15/2020 Oral Phase WFL Oral - Pudding Teaspoon -- Oral - Pudding Cup --  Oral - Honey Teaspoon -- Oral - Honey Cup -- Oral - Nectar Teaspoon -- Oral - Nectar Cup -- Oral - Nectar Straw -- Oral - Thin Teaspoon -- Oral - Thin Cup -- Oral - Thin Straw -- Oral - Puree -- Oral - Mech Soft -- Oral - Regular -- Oral - Multi-Consistency -- Oral - Pill -- Oral Phase - Comment --  CHL IP PHARYNGEAL PHASE 06/15/2020 Pharyngeal Phase WFL;Impaired Pharyngeal- Pudding Teaspoon -- Pharyngeal -- Pharyngeal- Pudding Cup -- Pharyngeal -- Pharyngeal- Honey Teaspoon -- Pharyngeal -- Pharyngeal- Honey Cup -- Pharyngeal -- Pharyngeal- Nectar Teaspoon -- Pharyngeal -- Pharyngeal- Nectar Cup -- Pharyngeal -- Pharyngeal- Nectar Straw -- Pharyngeal -- Pharyngeal- Thin Teaspoon -- Pharyngeal -- Pharyngeal- Thin Cup Penetration/Apiration after swallow;Trace aspiration Pharyngeal Material enters airway, CONTACTS cords and then ejected out;Material does not enter airway Pharyngeal- Thin Straw -- Pharyngeal -- Pharyngeal- Puree WFL Pharyngeal -- Pharyngeal- Mechanical Soft -- Pharyngeal -- Pharyngeal- Regular WFL Pharyngeal -- Pharyngeal- Multi-consistency -- Pharyngeal -- Pharyngeal- Pill -- Pharyngeal -- Pharyngeal Comment --  CHL IP CERVICAL ESOPHAGEAL PHASE 06/15/2020 Cervical Esophageal Phase Impaired Pudding Teaspoon -- Pudding Cup -- Honey Teaspoon -- Honey Cup -- Nectar Teaspoon -- Nectar Cup -- Nectar Straw -- Thin Teaspoon -- Thin Cup Prominent cricopharyngeal segment;Reduced cricopharyngeal relaxation Thin Straw Reduced cricopharyngeal relaxation;Prominent cricopharyngeal segment Puree Reduced cricopharyngeal relaxation;Prominent cricopharyngeal segment Mechanical Soft -- Regular Reduced cricopharyngeal relaxation;Prominent cricopharyngeal segment Multi-consistency -- Pill -- Cervical Esophageal Comment -- DeBlois, Katherene Ponto 06/16/2020, 12:33 PM               Microbiology: Recent Results (from the past 240 hour(s))  SARS Coronavirus 2 by RT PCR (hospital order, performed in [place] hospital lab)  Nasopharyngeal Nasopharyngeal Swab     Status: None   Collection Time: 06/14/20  2:03 PM   Specimen: Nasopharyngeal Swab  Result Value Ref Range Status   SARS Coronavirus 2 NEGATIVE NEGATIVE Final    Comment: (NOTE) SARS-CoV-2 target nucleic acids are NOT DETECTED.  The SARS-CoV-2 RNA is generally detectable in upper and lower respiratory specimens  during the acute phase of infection. The lowest concentration of SARS-CoV-2 viral copies this assay can detect is 250 copies / mL. A negative result does not preclude SARS-CoV-2 infection and should not be used as the sole basis for treatment or other patient management decisions.  A negative result may occur with improper specimen collection / handling, submission of specimen other than nasopharyngeal swab, presence of viral mutation(s) within the areas targeted by this assay, and inadequate number of viral copies (<250 copies / mL). A negative result must be combined with clinical observations, patient history, and epidemiological information.  Fact Sheet for Patients:   StrictlyIdeas.no  Fact Sheet for Healthcare Providers: BankingDealers.co.za  This test is not yet approved or  cleared by the [place] FDA and has been authorized for detection and/or diagnosis of SARS-CoV-2 by FDA under an Emergency Use Authorization (EUA).  This EUA will remain in effect (meaning this test can be used) for the duration of the COVID-19 declaration under Section 564(b)(1) of the Act, 21 U.S.C. section 360bbb-3(b)(1), unless the authorization is terminated or revoked sooner.  Performed at [facility], [place] [address] [place],  [address]      Labs: Basic Metabolic Panel: Recent Labs  Lab 06/15/20 0327 06/15/20 1710 06/16/20 0442 06/17/20 0418 06/18/20 0444  NA 137 135 138 134* 136  K 4.3 4.1 4.0 4.1 4.2  CL 103 101 101 99 101  CO2 24 27 27 25 25   GLUCOSE 132*  108* 147* 133* 137*  BUN 17 20 21  26* 25*  CREATININE 0.75 0.82 0.72 0.66 0.74  CALCIUM 8.7* 8.8* 9.1 8.4* 8.2*   Liver Function Tests: Recent Labs  Lab 06/16/20 0442  AST 25  ALT 26  ALKPHOS 61  BILITOT 0.5  PROT 7.6  ALBUMIN 4.2   No results for input(s): LIPASE, AMYLASE in the last 168 hours. No results for input(s): AMMONIA in the last 168 hours. CBC: Recent Labs  Lab 06/14/20 1450 06/15/20 0327 06/15/20 1710 06/16/20 0442 06/17/20 0418 06/18/20 0444 06/19/20 0437  WBC 8.0   < > 13.8* 9.9 13.1* 10.8* 7.8  NEUTROABS 4.4  --   --   --  11.0* 8.8* 6.2  HGB 13.5   < > 14.6 15.2* 15.4* 14.7 15.5*  HCT 41.4   < > 45.7 46.4* 48.7* 46.5* 47.2*  MCV 91.2   < > 89.8 90.3 90.7 91.0 88.4  PLT 188   < > 215 203 211 202 176   < > = values in this interval not displayed.   Cardiac Enzymes: No results for input(s): CKTOTAL, CKMB, CKMBINDEX, TROPONINI in the last 168 hours. BNP: BNP (last 3 results) Recent Labs    12/06/19 1625  BNP 26.8    ProBNP (last 3 results) No results for input(s): PROBNP in the last 8760 hours.  CBG: Recent Labs  Lab 06/16/20 1532 06/20/20 0757  GLUCAP 137* 92       Signed:  Alma Friendly, MD [facility] 06/20/2020, 12:04 PM

## 2020-06-21 ENCOUNTER — Telehealth: Payer: Self-pay

## 2020-06-21 ENCOUNTER — Encounter: Payer: Self-pay | Admitting: Family Medicine

## 2020-06-21 NOTE — Telephone Encounter (Signed)
LM requesting call back to complete TCM and schedule hospital follow up.   

## 2020-06-22 NOTE — Telephone Encounter (Signed)
LM requesting call back to complete TCM and schedule hospital follow up.   

## 2020-06-23 NOTE — Telephone Encounter (Signed)
LM requesting call back to complete TCM and schedule hospital follow up.   

## 2020-06-26 ENCOUNTER — Other Ambulatory Visit: Payer: Self-pay | Admitting: Family Medicine

## 2020-06-28 ENCOUNTER — Other Ambulatory Visit: Payer: Self-pay

## 2020-06-28 ENCOUNTER — Encounter: Payer: Self-pay | Admitting: *Deleted

## 2020-06-28 ENCOUNTER — Ambulatory Visit (INDEPENDENT_AMBULATORY_CARE_PROVIDER_SITE_OTHER): Payer: Medicare Other | Admitting: Family Medicine

## 2020-06-28 ENCOUNTER — Other Ambulatory Visit: Payer: Self-pay | Admitting: *Deleted

## 2020-06-28 ENCOUNTER — Encounter: Payer: Self-pay | Admitting: Family Medicine

## 2020-06-28 VITALS — BP 95/63 | HR 84 | Temp 98.1°F | Resp 16 | Ht 64.0 in | Wt 145.2 lb

## 2020-06-28 DIAGNOSIS — J9611 Chronic respiratory failure with hypoxia: Secondary | ICD-10-CM | POA: Diagnosis not present

## 2020-06-28 DIAGNOSIS — J849 Interstitial pulmonary disease, unspecified: Secondary | ICD-10-CM

## 2020-06-28 DIAGNOSIS — J920 Pleural plaque with presence of asbestos: Secondary | ICD-10-CM

## 2020-06-28 DIAGNOSIS — R1310 Dysphagia, unspecified: Secondary | ICD-10-CM | POA: Diagnosis not present

## 2020-06-28 DIAGNOSIS — J45909 Unspecified asthma, uncomplicated: Secondary | ICD-10-CM

## 2020-06-28 DIAGNOSIS — R061 Stridor: Secondary | ICD-10-CM

## 2020-06-28 MED ORDER — NYSTATIN 100000 UNIT/ML MT SUSP: 5.0000 mL | Freq: Four times a day (QID) | OROMUCOSAL | 3 refills | Status: DC

## 2020-06-28 NOTE — Patient Outreach (Addendum)
[facility]) Care Management  06/28/2020  TANIJA GERMANI 18-Jul-1946 [id number]   Late entry for telephone call to f/u on [facility] on Emmi call made on 06/26/20.  Mrs. Auth had reported that she did not have a follow appt with her primary care provider and that she did not know who to call if she had a problem.  On Monday 06/26/20, she reported that she had not made her appt yet but was planning on calling [name] on Tuesday to schedule her appt.  NP explained [facility] services and pt agreed to participate.  Advised NP would call weekly over the next 4 weeks. Will send new pt information.  Mrs. Lague reported her breathing was much better after having her choking episode and subsequent hospitalization. She had completed her steroid Rx. She also reported that her furosemide was restarted.  Advised she will need to report any new sxs indicating worsening of her condition to [name] for instructions and to avoid readmission to the hospital.  We agreed to talk again in one week.  [name]. [name], MSN, [facility] Nurse Practitioner [facility] Care Management [phone]

## 2020-06-28 NOTE — Progress Notes (Signed)
06/28/2020  CC:  Chief Complaint  Patient presents with  . Hospitalization Follow-up    Patient is a 74 y.o. Caucasian female who presents for  hospital follow up, specifically [facility] face-to-face visit. Dates hospitalized: 6/24-6/29, 2021. Days since d/c from hospital: 8 days Patient was discharged from hospital to her independent living facility [facility]). Reason for admission to hospital: stridor, worry of aspiration.   Date of interactive (phone) contact with patient and/or caregiver: nurse attempted 6/30, 7/1, and 7/2.  I have reviewed patient's discharge summary plus pertinent specific notes, labs, and imaging from the hospitalization.   Presented with dysphagia related "choking on food", then stridor. She was put on racemic epi and steroids in ED-->admitted for further evaluation: Possible stridor/esophageal impairment/Possible vocal cord dysfunction: Stayed on baseline 3 L of O2 for her chronic hypoxic RF, saturating well. Chest x-ray, CT chest unremarkable for any evidence of aspiration ENT consulted performed flexible laryngoscopy on 6/23, reported normal upper airway evaluation. No further recommendation PCCM consulted,Dr. Lisabeth Register not recommend bronchoscopy as it may cause injury to the vocal cords,recommend speech therapy as outpatient, management of GERD and anxiety control SLP consulted, MBS done which showed patient at risk for food lodging in the cervical esophagus and recommended to avoid meats, only soft diet GI consulted, EGD done on 06/17/2020 showed no endoscopic abnormality to explain patient's complaint of dysphagia. Gross inspection of the vocal cords appeared to be normal. Dilatation of the entire esophagus was done Continue steroids for another 2 days to complete 5 days of therapy Continue soft diet, avoid meat Follow up with PCP and pulmonologist  CURRENTLY: Feeling much better! Says the esoph dilation helped. Finished  prednisone 2 d after d/c.   Continued soft diet at home.  No choking episodes.  Has some mild fullness in esoph as she swallows but no distinct feeling of dysphagia.   Breathing is good: no SoB or oxygen need at rest.   Gets SOB with ambulation and oxygen goes to 88% sometimes.  Only wears oxygen based on the severity of her feeling of DOE, uses 3L.  No oxygen use during the over the last 1 yr. Currently feeling a little drained, low energy, very mildly light headed. BP low here today.  Daily fluid intake: 16 oz water, 16 oz ginger ale, 1 cup coffee qAM and 16 oz tea a day.   Medication reconciliation was done today and patient is taking meds as recommended by discharging hospitalist/specialist.    ROS: no fevers, no CP, no SOB, no wheezing, no cough, no HAs, no rashes, no melena/hematochezia.  No polyuria or polydipsia.  No focal weakness, paresthesias, or tremors.  No acute vision or hearing abnormalities. No n/v/d or abd pain.  No palpitations.  No LE swelling.  PMH:  Past Medical History:  Diagnosis Date  . Acute upper GI bleed 06/2014   while pt taking coumadin, plavix, and meloxicam---despite being told not to take coumadin.  . Anginal pain ([place])    Nonobstructive CAD 2014; however, her cardiologist put her on a statin for this and NOT for hyperlipidemia per pt report.  Atyp CP 08/2017 at card f/u, plan for myoc perf imaging.  . Anxiety    panic attacks  . Asthma    w/ asbestososis   . BPPV (benign paroxysmal positional vertigo) 12/16/2012  . Chronic diastolic CHF (congestive heart failure) (HCC)    dry wt as of 11/06/16 is 168 lbs.  . Chronic lower back pain   .  COPD (chronic obstructive pulmonary disease) ([place])   . DDD (degenerative disc disease)    lumbar and cervical.   . Diverticular disease   . Fibromyalgia    Patient states dx was around her late 81s but she had sx's for years prior to this.  . H/O hiatal hernia   . History of pneumonia    hospitalized 12/2011, 02/2013,  and 07/2013 [facility]) for this  . HTN (hypertension)    Renal artery dopplers 04/2013 neg for stenosis.  . Hypervitaminosis D 09/27/2019   over-supplemented.  Stopped vit D and plan recheck 2 mo.  . Idiopathic angio-edema-urticaria [id number]; 2021   Angioedema component was very minimal.  2021->Dr. Bobbitt (allergist) eval.  . Insomnia   . Iron deficiency anemia    Hematologist in [place], [place] did extensive w/u; no cause found; failed oral supplement;; gets fairly regular (q22m or so) IV iron infusions (Venofer -iron sucrose- 200mg  with procrit.  "for 14 yr I've been getting blood work q month & getting infusions prn" (07/12/2013).  Dr. Marin Olp locally, iron infusions done, EPO deficiency dx'd  . Migraine syndrome    "not as often anymore; used to be ~ q wk" (07/12/2013)  . Mixed incontinence urge and stress   . Nephrolithiasis    "passed all on my own or they are still in there" (07/12/2013)  . 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 Carles Collet, neuro, saw her 11/24/17 and recommended d/c of abilify as first step.  D/c'd abilify and pt got complete recovery.  . Oropharyngeal dysphagia    swallowing study speech path 05/2020. Gastric bx's showed gastritis, h pylori NEG  . OSA on CPAP    prior to move to [place]--had another sleep study 10/2015 w/pulm Dr. Camillo Flaming.  . Osteoarthritis    "severe; progressing fast" (07/12/2013); multiple joints-not surgical candidate for TKR (03/2015).  Triamcinolon knee injections by Dr. Tessa Lerner 12/2017.  [name] Kitchen Pernicious anemia 08/24/2014  . Pleural plaque with presence of asbestos 07/22/2013  . Pulmonary embolism ([place]) 07/2013   Dx at [facility] with very small peripheral upper lobe pe 07/2013: pt took coumadin for about 8-9 mo  . Pyelonephritis    "several times over the last 30 yr" (07/12/2013)  . RBBB (right bundle branch block)   . Recurrent major depression ([place])   . Recurrent UTI    hx of hospitalization for pyelonephritis; started abx prophylaxis 06/2015  . Syncope    Hypotensive;  ED visit--Dr. Terrence Dupont did Cath--nonobstructive CAD, EF 55-60%.  In retrospect, suspect pt rx med misuse/polypharmacy    PSH:  Past Surgical History:  Procedure Laterality Date  . APPENDECTOMY  1960  . AXILLARY SURGERY Left 1978   Multiple "lump" in armpit per pt  . BIOPSY  06/17/2020   Gastric bx->gastritis, h pylori neg.  Procedure: BIOPSY;  Surgeon: Carol Ada, MD;  Location: [facility] ENDOSCOPY;  Service: Endoscopy;;  . CARDIAC CATHETERIZATION  01/2013   nonobstructive CAD, EF 55-60%  . CARDIOVASCULAR STRESS TEST  02/22/15   Low risk myocard perf imaging; wall motion normal, normal EF  . carotid duplex doppler  10/21/2017   R vertebral flow suggestive of possible distal obstruction.  Pt declined further w/u as of 10/29/17 but need to revisit this problem periodically.  . COCCYX REMOVAL  1972  . DEXA  06/05/2017   T-score -3.1  . DILATION AND CURETTAGE OF UTERUS  ? 1970's  . ESOPHAGOGASTRODUODENOSCOPY N/A 07/19/2014   Gastritis found + in the setting of supratherapeutic INR, +plavix, + meloxicam.  .  ESOPHAGOGASTRODUODENOSCOPY (EGD) WITH PROPOFOL N/A 06/17/2020   NO stricture or other prob to explain pt's dysphagia, dilation was done anyway.  Gastric bx's-->gastritis, h pylori neg. Procedure: ESOPHAGOGASTRODUODENOSCOPY (EGD) WITH PROPOFOL;  Surgeon: Carol Ada, MD;  Location: [facility];  Service: Endoscopy;  Laterality: N/A;  . EYE SURGERY Left 2012-2013   "injections for ~ 1 yr; don't really know what for" (07/12/2013)  . HEEL SPUR SURGERY Left 2008  . KNEE SURGERY  2005  . LEFT HEART CATHETERIZATION WITH CORONARY ANGIOGRAM N/A 01/30/2013   Procedure: LEFT HEART CATHETERIZATION WITH CORONARY ANGIOGRAM;  Surgeon: Clent Demark, MD;  Location: [facility] CATH LAB;  Service: Cardiovascular;  Laterality: N/A;  . MALONEY DILATION  06/17/2020   Procedure: Venia Minks DILATION;  Surgeon: Carol Ada, MD;  Location: [facility];  Service: Endoscopy;;  . PLANTAR FASCIA RELEASE Left 2008  . SPIROMETRY   04/25/14   In hosp for acute asthma/COPD flare: mixed obstructive and restrictive lung disease. The FEV1 is severely reduced at 45% predicted.  FEV1 signif decreased compared to prior spirometry 07/23/13.  . TENDON RELEASE  1996   Right forearm and hand  . TOTAL ABDOMINAL HYSTERECTOMY  1974  . TRANSTHORACIC ECHOCARDIOGRAM  01/2013; 04/2014;08/2015; 09/2017   2014--NORMAL.  2015--focal basal septal hypertrophy, EF 55-60%, grade I diast dysfxn, mild LAE.  08/2015 EF 55-60%, nl LV syst fxn, grade I DD, valves wnl. 10/21/17: EF 65-70%, grd I DD, o/w normal.    MEDS:  Outpatient Medications Prior to Visit  Medication Sig Dispense Refill  . albuterol (VENTOLIN HFA) 108 (90 Base) MCG/ACT inhaler Inhale 1-2 puffs into the lungs every 6 (six) hours as needed for wheezing or shortness of breath. 18 g 1  . alendronate (FOSAMAX) 70 MG tablet Take 1 tablet (70 mg total) by mouth every 7 (seven) days. Take with a full glass of water on an empty stomach, first thing in the morning and remain up right for 30 minutes after taking. 12 tablet 3  . ALPRAZolam (XANAX) 1 MG tablet TAKE 1 TABLET THREE TIMES DAILY AS NEEDED FOR ANXIETY. (Patient taking differently: Take 1 mg by mouth 3 (three) times daily as needed for anxiety or sleep. ) 90 tablet 0  . aspirin 81 MG tablet Take 81 mg by mouth at bedtime.    . cyclobenzaprine (FLEXERIL) 5 MG tablet Take 1 tablet (5 mg total) by mouth 3 (three) times daily as needed for muscle spasms. 60 tablet 1  . docusate sodium (COLACE) 100 MG capsule Take 1 capsule (100 mg total) by mouth 2 (two) times daily. 10 capsule 0  . donepezil (ARICEPT) 10 MG tablet Take 1 tablet (10 mg total) by mouth at bedtime. 90 tablet 3  . DULoxetine (CYMBALTA) 30 MG capsule TAKE 1 CAPSULE ONCE DAILY ALONG WITH 60MG  CAPSULE FOR TOTAL 90MG  DAILY. (Patient taking differently: Take by mouth daily. Along with 60 mg capsule for total 90 mg.) 90 capsule 0  . fluconazole (DIFLUCAN) 100 MG tablet TAKE (1) TABLET DAILY  AS NEEDED FOR THRUSH (Patient taking differently: Take 100 mg by mouth daily as needed (for thrush). ) 15 tablet 0  . fluticasone (FLONASE) 50 MCG/ACT nasal spray Place 2 sprays into both nostrils daily. 16 g 11  . furosemide (LASIX) 40 MG tablet Take 1 tablet (40 mg total) by mouth daily as needed for fluid.    [name] Kitchen ipratropium (ATROVENT) 0.03 % nasal spray Place 2 sprays into both nostrils every 12 (twelve) hours. 30 mL 11  . isosorbide mononitrate (  IMDUR) 30 MG 24 hr tablet TAKE 2 TABLETS IN THE MORNING AND 1 TABLET IN THE EVENING. (Patient taking differently: Take 30 mg by mouth See admin instructions. Takes 2 tablets by mouth in the morning and 1 tablet in the evening.) 270 tablet 1  . lamoTRIgine (LAMICTAL) 150 MG tablet TAKE 1 TABLET EACH DAY. 30 tablet 0  . metoprolol succinate (TOPROL-XL) 50 MG 24 hr tablet TAKE (1) TABLET DAILY IN THE MORNING. (Patient taking differently: Take 50 mg by mouth daily. ) 90 tablet 3  . Multiple Vitamin (MULTIVITAMIN WITH MINERALS) TABS tablet Take 1 tablet by mouth daily.    . nitroGLYCERIN (NITROSTAT) 0.4 MG SL tablet Place 1 tablet (0.4 mg total) under the tongue every 5 (five) minutes as needed for chest pain (x 3 doses). 25 tablet 11  . nystatin (MYCOSTATIN) 100000 UNIT/ML suspension Take 5 mLs (500,000 Units total) by mouth 4 (four) times daily. 60 mL 0  . oxyCODONE-acetaminophen (PERCOCET) 10-325 MG tablet Every 6 hours as needed (Patient taking differently: Take 1 tablet by mouth every 6 (six) hours as needed for pain. Every 6 hours as needed) 100 tablet 0  . pantoprazole (PROTONIX) 40 MG tablet Take 1 tablet (40 mg total) by mouth 2 (two) times daily. 90 tablet 0  . rosuvastatin (CRESTOR) 20 MG tablet Take 1 tablet (20 mg total) by mouth daily. 90 tablet 3  . solifenacin (VESICARE) 10 MG tablet Take 1 tablet (10 mg total) by mouth daily. 30 tablet 6  . traZODone (DESYREL) 50 MG tablet TAKE 2-4 TABLETS AT BEDTIME 120 tablet 0  . EPINEPHrine 0.3 mg/0.3 mL IJ  SOAJ injection Inject 0.3 mLs (0.3 mg total) into the muscle as needed for anaphylaxis. (Patient not taking: Reported on 06/28/2020) 1 each 1  . ondansetron (ZOFRAN) 8 MG tablet Take 1 tablet (8 mg total) by mouth every 8 (eight) hours as needed for nausea or vomiting. (Patient not taking: Reported on 06/28/2020) 30 tablet 6   No facility-administered medications prior to visit.   EXAM:  Vitals with BMI 06/28/2020 06/20/2020 06/19/2020  Height 5\' 4"  - -  Weight 145 lbs 3 oz - -  BMI 82.99 - -  Systolic 95 371 696  Diastolic 63 72 72  Pulse 84 67 70  O2 sat on RA today is 94%  Gen: Alert, well appearing.  Patient is oriented to person, place, time, and situation. AFFECT: pleasant, lucid thought and speech. VEL:FYBO: no injection, icteris, swelling, or exudate.  EOMI, PERRLA. Mouth: lips without lesion/swelling.  Oral mucosa pink and moist. Oropharynx without erythema, exudate, or swelling.  CV: RRR, no m/r/g.   LUNGS: CTA bilat, nonlabored resps, good aeration in all lung fields. EXT: no clubbing or cyanosis.  no edema.   Pertinent labs/imaging Lab Results  Component Value Date   TSH 2.750 10/04/2019   Lab Results  Component Value Date   WBC 7.8 06/19/2020   HGB 15.5 (H) 06/19/2020   HCT 47.2 (H) 06/19/2020   MCV 88.4 06/19/2020   PLT 176 06/19/2020   Lab Results  Component Value Date   CREATININE 0.74 06/18/2020   BUN 25 (H) 06/18/2020   NA 136 06/18/2020   K 4.2 06/18/2020   CL 101 06/18/2020   CO2 25 06/18/2020   Lab Results  Component Value Date   ALT 26 06/16/2020   AST 25 06/16/2020   ALKPHOS 61 06/16/2020   BILITOT 0.5 06/16/2020   Lab Results  Component Value Date   CHOL  172 09/22/2018   Lab Results  Component Value Date   HDL 43.70 09/22/2018   Lab Results  Component Value Date   LDLCALC 92 09/22/2018   Lab Results  Component Value Date   TRIG 184.0 (H) 09/22/2018   Lab Results  Component Value Date   CHOLHDL 4 09/22/2018   Lab Results   Component Value Date   HGBA1C 5.6 01/02/2016   Lab Results  Component Value Date   DDIMER 2.19 (H) 05/31/2016   ASSESSMENT/PLAN:  1) Swallowing dysfunction, question of aspiration. She feels better but still needs to always do a soft mechanical diet. CT chest reassuring no aspiration changes. ENT eval of upper airway was normal. Pulm: no bronchoscopy b/c risk outweighed potential benefits.  2) Asbestos-related pleural dz-->changes on CT this hospitalization suggesting asbestosis/fibrosis at lung bases, +asthma--->chronic hypoxic resp failure. No supplemental oxygen requirement for the most part at the current time. She requests referral to a  pulm MD--->she has been seeing Dr. Camillo Flaming and pt states she is tired of either always being rescheduled or having poor communication from Dr. Cruz Condon office.  New pulm referral ordered today.  3) HTN: her bp has always been labile.  Some periods of unexplainable highs and some of unexplainable lows.  She is a bit low today, feels some subtle/mild effects of this. No med changes at this time but I encouraged her to increase her daily fluid intake by 20 oz or so. Of note, she is NOT taking lasix at this time.  Medical decision making of moderate complexity was utilized today.  FOLLOW UP:  3 mo RCI  Signed:  Crissie Sickles, MD           06/28/2020

## 2020-07-03 ENCOUNTER — Other Ambulatory Visit: Payer: Self-pay | Admitting: *Deleted

## 2020-07-03 NOTE — Patient Outreach (Signed)
[facility]) Care Management  07/03/2020  Linda [name] 10/26/1946 [id number]  Transition of care call: no answer, left message to return my call.  Initial assessment  Outpatient Encounter Medications as of 07/03/2020  Medication Sig Note  . albuterol (VENTOLIN HFA) 108 (90 Base) MCG/ACT inhaler Inhale 1-2 puffs into the lungs every 6 (six) hours as needed for wheezing or shortness of breath.   [name] Kitchen alendronate (FOSAMAX) 70 MG tablet Take 1 tablet (70 mg total) by mouth every 7 (seven) days. Take with a full glass of water on an empty stomach, first thing in the morning and remain up right for 30 minutes after taking.   [name] Kitchen ALPRAZolam (XANAX) 1 MG tablet TAKE 1 TABLET THREE TIMES DAILY AS NEEDED FOR ANXIETY. (Patient taking differently: Take 1 mg by mouth 3 (three) times daily as needed for anxiety or sleep. )   . aspirin 81 MG tablet Take 81 mg by mouth at bedtime.   . cyclobenzaprine (FLEXERIL) 5 MG tablet Take 1 tablet (5 mg total) by mouth 3 (three) times daily as needed for muscle spasms.   [name] Kitchen docusate sodium (COLACE) 100 MG capsule Take 1 capsule (100 mg total) by mouth 2 (two) times daily.   [name] Kitchen donepezil (ARICEPT) 10 MG tablet Take 1 tablet (10 mg total) by mouth at bedtime.   . DULoxetine (CYMBALTA) 30 MG capsule TAKE 1 CAPSULE ONCE DAILY ALONG WITH 60MG  CAPSULE FOR TOTAL 90MG  DAILY. (Patient taking differently: Take by mouth daily. Along with 60 mg capsule for total 90 mg.) 06/14/2020: Pt takes 90mg  at bedtime   . EPINEPHrine 0.3 mg/0.3 mL IJ SOAJ injection Inject 0.3 mLs (0.3 mg total) into the muscle as needed for anaphylaxis. (Patient not taking: Reported on 06/28/2020)   . fluconazole (DIFLUCAN) 100 MG tablet TAKE (1) TABLET DAILY AS NEEDED FOR THRUSH (Patient taking differently: Take 100 mg by mouth daily as needed (for thrush). )   . fluticasone (FLONASE) 50 MCG/ACT nasal spray Place 2 sprays into both nostrils daily.   . furosemide (LASIX) 40 MG tablet Take 1 tablet  (40 mg total) by mouth daily as needed for fluid.   [name] Kitchen ipratropium (ATROVENT) 0.03 % nasal spray Place 2 sprays into both nostrils every 12 (twelve) hours.   . isosorbide mononitrate (IMDUR) 30 MG 24 hr tablet TAKE 2 TABLETS IN THE MORNING AND 1 TABLET IN THE EVENING. (Patient taking differently: Take 30 mg by mouth See admin instructions. Takes 2 tablets by mouth in the morning and 1 tablet in the evening.)   . lamoTRIgine (LAMICTAL) 150 MG tablet TAKE 1 TABLET EACH DAY.   . metoprolol succinate (TOPROL-XL) 50 MG 24 hr tablet TAKE (1) TABLET DAILY IN THE MORNING. (Patient taking differently: Take 50 mg by mouth daily. )   . Multiple Vitamin (MULTIVITAMIN WITH MINERALS) TABS tablet Take 1 tablet by mouth daily.   . nitroGLYCERIN (NITROSTAT) 0.4 MG SL tablet Place 1 tablet (0.4 mg total) under the tongue every 5 (five) minutes as needed for chest pain (x 3 doses).   . nystatin (MYCOSTATIN) 100000 UNIT/ML suspension Take 5 mLs (500,000 Units total) by mouth 4 (four) times daily.   . ondansetron (ZOFRAN) 8 MG tablet Take 1 tablet (8 mg total) by mouth every 8 (eight) hours as needed for nausea or vomiting. (Patient not taking: Reported on 06/28/2020)   . oxyCODONE-acetaminophen (PERCOCET) 10-325 MG tablet Every 6 hours as needed (Patient taking differently: Take 1 tablet by mouth every 6 (six) hours  as needed for pain. Every 6 hours as needed)   . pantoprazole (PROTONIX) 40 MG tablet Take 1 tablet (40 mg total) by mouth 2 (two) times daily.   . rosuvastatin (CRESTOR) 20 MG tablet Take 1 tablet (20 mg total) by mouth daily.   . solifenacin (VESICARE) 10 MG tablet Take 1 tablet (10 mg total) by mouth daily.   . traZODone (DESYREL) 50 MG tablet TAKE 2-4 TABLETS AT BEDTIME    No facility-administered encounter medications on file as of 07/03/2020.   [name]. [name], MSN, [facility] Gerontological Nurse Practitioner [facility] Care Management [phone]

## 2020-07-05 ENCOUNTER — Other Ambulatory Visit: Payer: Self-pay | Admitting: *Deleted

## 2020-07-05 NOTE — Patient Outreach (Addendum)
[facility]) Care Management  07/05/2020  Linda [name] 07-Nov-1946 [id number]  Telephone outreach.  [name] reports she is improving although she did have some swallowing issues with a pimento cheese sandwich. She said she ate the crust and that was difficult to swallow and she had to cough it up.  She says she is able to eat mashed potatoes, mac and cheese, sweet potatoes from the dining room. That is it. She does report a 10# wt lose since her choking episode.  She states her breathing is fine now.  Offered to call [facility] independent living, she agrees to allowing me to do this, which I did and I was told that the regulations of the independent living area does not allow for modifications of the offerings. The residents are able to make choices themselves. They can buy food that is preferable to have in their rooms.  I called [name] back and advised her of this. Made some suggestions for her to have in her apt: peanut butter, canned pinto beans, tuna fish, Carnation Instant Breakfast.   Will call her again in another week.  [name]. [name], MSN, [facility] Gerontological Nurse Practitioner [facility] Care Management [phone]

## 2020-07-06 DIAGNOSIS — H2513 Age-related nuclear cataract, bilateral: Secondary | ICD-10-CM | POA: Diagnosis not present

## 2020-07-12 ENCOUNTER — Other Ambulatory Visit: Payer: Self-pay | Admitting: *Deleted

## 2020-07-12 NOTE — Patient Outreach (Signed)
[facility]) Care Management  07/12/2020  Linda [name] [birth year] [id number]   Telephone assessment: Unanswered call. Will reschedule to discuss the following.  Diet progress:  High protein foods that are soft: eggs, cottage cheese, shredded parmesan, swiss, mozzarella, cheddar cheeses, oats, greek yogurt, milk, broccoli soup, scrambled lean hamburger, tuna, quinoa, lentils, Ezekial bread, chopped [place], all fish, peanut butter.  Continue to monitor for HF, COPD, HTN  Complete initial evaluation components.    [name]. [name], MSN, [facility] Gerontological Nurse Practitioner [facility] Care Management [phone]

## 2020-07-17 ENCOUNTER — Other Ambulatory Visit: Payer: Self-pay | Admitting: Family Medicine

## 2020-07-17 NOTE — Telephone Encounter (Signed)
Received a refill request for  Duloxetine 60mg , but duloxetine 60mg   is no longer on the patients medication list.   Should this medication still be prescribed for the patient?  Please advise

## 2020-07-20 ENCOUNTER — Ambulatory Visit: Payer: Self-pay | Admitting: *Deleted

## 2020-07-24 ENCOUNTER — Encounter: Payer: Self-pay | Admitting: Registered Nurse

## 2020-07-24 ENCOUNTER — Encounter: Payer: Medicare Other | Attending: Physical Medicine & Rehabilitation | Admitting: Registered Nurse

## 2020-07-24 ENCOUNTER — Other Ambulatory Visit: Payer: Self-pay

## 2020-07-24 VITALS — BP 107/66 | HR 81 | Temp 98.5°F | Ht 64.0 in | Wt 143.0 lb

## 2020-07-24 DIAGNOSIS — M47816 Spondylosis without myelopathy or radiculopathy, lumbar region: Secondary | ICD-10-CM | POA: Insufficient documentation

## 2020-07-24 DIAGNOSIS — Z79891 Long term (current) use of opiate analgesic: Secondary | ICD-10-CM | POA: Diagnosis not present

## 2020-07-24 DIAGNOSIS — Z5181 Encounter for therapeutic drug level monitoring: Secondary | ICD-10-CM | POA: Insufficient documentation

## 2020-07-24 DIAGNOSIS — M7062 Trochanteric bursitis, left hip: Secondary | ICD-10-CM

## 2020-07-24 DIAGNOSIS — R2689 Other abnormalities of gait and mobility: Secondary | ICD-10-CM | POA: Insufficient documentation

## 2020-07-24 DIAGNOSIS — G894 Chronic pain syndrome: Secondary | ICD-10-CM | POA: Diagnosis not present

## 2020-07-24 DIAGNOSIS — G8929 Other chronic pain: Secondary | ICD-10-CM

## 2020-07-24 DIAGNOSIS — M7061 Trochanteric bursitis, right hip: Secondary | ICD-10-CM

## 2020-07-24 DIAGNOSIS — M797 Fibromyalgia: Secondary | ICD-10-CM | POA: Diagnosis not present

## 2020-07-24 DIAGNOSIS — M1711 Unilateral primary osteoarthritis, right knee: Secondary | ICD-10-CM | POA: Diagnosis not present

## 2020-07-24 DIAGNOSIS — M546 Pain in thoracic spine: Secondary | ICD-10-CM | POA: Diagnosis not present

## 2020-07-24 DIAGNOSIS — M5416 Radiculopathy, lumbar region: Secondary | ICD-10-CM | POA: Diagnosis not present

## 2020-07-24 DIAGNOSIS — Z79899 Other long term (current) drug therapy: Secondary | ICD-10-CM | POA: Insufficient documentation

## 2020-07-24 DIAGNOSIS — M1712 Unilateral primary osteoarthritis, left knee: Secondary | ICD-10-CM | POA: Diagnosis not present

## 2020-07-24 MED ORDER — OXYCODONE-ACETAMINOPHEN 10-325 MG PO TABS: ORAL_TABLET | ORAL | 0 refills | Status: DC

## 2020-07-24 NOTE — Progress Notes (Signed)
Subjective:    Patient ID: Linda [name], female    DOB: 09-Feb-1946, 74 y.o.   MRN: [id number]  HPI: Linda [name] is a 74 y.o. female who returns for follow up appointment for chronic pain and medication refill. She states her pain is located in her mid- lower back pain radiating into her bilateral hips R>L and right lower extremity. Also reports bilateral knee pain R>L. She rates her pain 8. Her current exercise regime is walking short distances  and performing stretching exercises.  Linda [name] was hospitalized at [facility] on 06/15/2020 and discharged on 06/20/2020 for Stridor. Discharge Note was reviewed.   Linda [name] [name] equivalent is 60.00 MME.  She  is also prescribed Alprazolam by [name]. We have discussed the black box warning of using opioids and benzodiazepines. I highlighted the dangers of using these drugs together and discussed the adverse events including respiratory suppression, overdose, cognitive impairment and importance of compliance with current regimen. We will continue to monitor and adjust as indicated.   Oral Swab was Performed Today. .     Pain Inventory Average Pain 8 Pain Right Now 8 My pain is constant, sharp and aching & throbbin  In the last 24 hours, has pain interfered with the following? General activity 8 Relation with others 8 Enjoyment of life 9 What TIME of day is your pain at its worst? MORNING, EVENING, DAYTIME & NIGHT Sleep (in general) Fair  Pain is worse with: walking, bending, standing and some activites Pain improves with: rest, medication and injections Relief from Meds: 5  Mobility use a cane how many minutes can you walk? 2-10 MINS ability to climb steps?  no do you drive?  yes Do you have any goals in this area?  yes  Function retired I need assistance with the following:  meal prep, household duties and shopping Do you have any goals in this area?  yes  Neuro/Psych bladder control  problems trouble walking spasms anxiety  Prior Studies Any changes since last visit?  yes  Physicians involved in your care Any changes since last visit?  no   Family History  Problem Relation Age of Onset   Arthritis Mother    Kidney disease Mother    Heart disease Father    Stroke Father    Hypertension Father    Diabetes Father    Heart attack Father    Heart attack Paternal Grandmother    Diabetes Sister        one sister   Hypertension Sister    Asthma Sister    Hypertension Brother    Asthma Brother    Asthma Daughter    Multiple sclerosis Son    Social History   Socioeconomic History   Marital status: Widowed    Spouse name: Not on file   Number of children: 2   Years of education: Not on file   Highest education level: Not on file  Occupational History   Occupation: Retired    Comment: Pharmacist, 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  Relocated to [place] 09/2012 to be closer to her son who has MS.   Husband d 2015--mesothelioma.   Occupation: former Pharmacist, hospital.   Education: masters degree level.   No T/A/Ds.   Lives in [place], independent living.   Social Determinants of Health   Financial Resource Strain:    Difficulty of Paying Living Expenses:   Food Insecurity:    Worried About Charity fundraiser in the Last Year:    Arboriculturist in the Last Year:   Transportation Needs:    Film/video editor (Medical):    Lack of Transportation (Non-Medical):   Physical Activity:    Days of Exercise per Week:    Minutes of Exercise per Session:   Stress:    Feeling of  Stress :   Social Connections:    Frequency of Communication with Friends and Family:    Frequency of Social Gatherings with Friends and Family:    Attends Religious Services:    Active Member of Clubs or Organizations:    Attends Archivist Meetings:    Marital Status:    Past Surgical History:  Procedure Laterality Date   APPENDECTOMY  1960   AXILLARY SURGERY Left 1978   Multiple "lump" in armpit per pt   BIOPSY  06/17/2020   Gastric bx->gastritis, h pylori neg.  Procedure: BIOPSY;  Surgeon: Carol Ada, MD;  Location: [facility];  Service: Endoscopy;;   CARDIAC CATHETERIZATION  01/2013   nonobstructive CAD, EF 55-60%   CARDIOVASCULAR STRESS TEST  02/22/15   Low risk myocard perf imaging; wall motion normal, normal EF   carotid duplex doppler  10/21/2017   R vertebral flow suggestive of possible distal obstruction.  Pt declined further w/u as of 10/29/17 but need to revisit this problem periodically.   COCCYX REMOVAL  1972   DEXA  06/05/2017   T-score -3.1   DILATION AND CURETTAGE OF UTERUS  ? 1970's   ESOPHAGOGASTRODUODENOSCOPY N/A 07/19/2014   Gastritis found + in the setting of supratherapeutic INR, +plavix, + meloxicam.   ESOPHAGOGASTRODUODENOSCOPY (EGD) WITH PROPOFOL N/A 06/17/2020   NO stricture or other prob to explain pt's dysphagia, dilation was done anyway.  Gastric bx's-->gastritis, h pylori neg. Procedure: ESOPHAGOGASTRODUODENOSCOPY (EGD) WITH PROPOFOL;  Surgeon: Carol Ada, MD;  Location: [facility];  Service: Endoscopy;  Laterality: N/A;   EYE SURGERY Left 2012-2013   "injections for ~ 1 yr; don't really know what for" (07/12/2013)   HEEL SPUR SURGERY Left 2008   KNEE SURGERY  2005   LEFT HEART CATHETERIZATION WITH CORONARY ANGIOGRAM N/A 01/30/2013   Procedure: LEFT HEART CATHETERIZATION WITH CORONARY ANGIOGRAM;  Surgeon: Clent Demark, MD;  Location: [place] [facility];  Service: Cardiovascular;  Laterality: N/A;   MALONEY DILATION   06/17/2020   Procedure: Venia Minks DILATION;  Surgeon: Carol Ada, MD;  Location: [facility];  Service: Endoscopy;;   PLANTAR FASCIA RELEASE Left 2008   SPIROMETRY  04/25/14   In hosp for acute asthma/COPD flare: mixed obstructive and restrictive lung disease. The FEV1 is severely reduced at 45% predicted.  FEV1 signif decreased compared to prior spirometry 07/23/13.   TENDON RELEASE  1996   Right forearm and hand   TOTAL ABDOMINAL HYSTERECTOMY  1974   TRANSTHORACIC ECHOCARDIOGRAM  01/2013; 04/2014;08/2015; 09/2017   2014--NORMAL.  2015--focal basal septal hypertrophy, EF 55-60%, grade I diast dysfxn, mild LAE.  08/2015 EF 55-60%, nl LV syst fxn, grade I DD, valves wnl. 10/21/17: EF 65-70%, grd I DD, o/w  normal.   Past Medical History:  Diagnosis Date   Acute upper GI bleed 06/2014   while pt taking coumadin, plavix, and meloxicam---despite being told not to take coumadin.   Anginal pain ([place])    Nonobstructive CAD 2014; however, her cardiologist put her on a statin for this and NOT for hyperlipidemia per pt report.  Atyp CP 08/2017 at card f/u, plan for myoc perf imaging.   Anxiety    panic attacks   Asthma    w/ asbestososis    BPPV (benign paroxysmal positional vertigo) 12/16/2012   Chronic diastolic CHF (congestive heart failure) (HCC)    dry wt as of 11/06/16 is 168 lbs.   Chronic lower back pain    COPD (chronic obstructive pulmonary disease) (HCC)    DDD (degenerative disc disease)    lumbar and cervical.    Diverticular disease    Fibromyalgia    Patient states dx was around her late 55s but she had sx's for years prior to this.   H/O hiatal hernia    History of pneumonia    hospitalized 12/2011, 02/2013, and 07/2013 [facility]) for this   HTN (hypertension)    Renal artery dopplers 04/2013 neg for stenosis.   Hypervitaminosis D 09/27/2019   over-supplemented.  Stopped vit D and plan recheck 2 mo.   Idiopathic angio-edema-urticaria 72014; 2021   Angioedema component  was very minimal.  2021->Dr. Bobbitt (allergist) eval.   Insomnia    Iron deficiency anemia    [place]emat[place] in [place], [place] did extensive w/u; no cause found; failed oral supplement;; gets fairly regular (q34m or so) IV iron infusions (Venofer -iron sucrose- 200mg  with procrit.  "for 14 yr I've been getting blood work q month & getting infusions prn" (07/12/2013).  Dr. Marin Olp locally, iron infusions done, EPO deficiency dx'd   Migraine syndrome    "not as often anymore; used to be ~ q wk" (07/12/2013)   Mixed incontinence urge and stress    Nephrolithiasis    "passed all on my own or they are still in there" (07/12/2013)   Neuroleptic induced Parkinsonism (HCC) 2018   Dr. Carles Collet, neuro, saw her 11/24/17 and recommended d/c of abilify as first step.  D/c'd abilify and pt got complete recovery.   Oropharyngeal dysphagia    swallowing study speech path 05/2020. Gastric bx's showed gastritis, h pylori NEG   OSA on CPAP    prior to move to [place]--had another sleep study 10/2015 w/pulm Dr. Camillo Flaming.   Osteoarthritis    "severe; progressing fast" (07/12/2013); multiple joints-not surgical candidate for TKR (03/2015).  Triamcinolon knee injections by Dr. Tessa Lerner 12/2017.   Pernicious anemia 08/24/2014   Pleural plaque with presence of asbestos 07/22/2013   Pulmonary embolism ([place]) 07/2013   Dx at [facility] with very small peripheral upper lobe pe 07/2013: pt took coumadin for about 8-9 mo   Pyelonephritis    "several times over the last 30 yr" (07/12/2013)   RBBB (right bundle branch block)    Recurrent major depression ([place])    Recurrent UTI    hx of hospitalization for pyelonephritis; started abx prophylaxis 06/2015   Syncope    Hypotensive; ED visit--Dr. Terrence Dupont did Cath--nonobstructive CAD, EF 55-60%.  In retrospect, suspect pt rx med misuse/polypharmacy   BP 107/66    Pulse 81    Temp 98.5 F (36.9 C)    Ht 5\' 4"  (1.626 m)    Wt 143 lb (64.9 kg)    SpO2 91%    BMI  24.55 kg/m   Opioid Risk Score:    Fall Risk Score:  `1  Depression screen PHQ 2/9  Depression screen [facility] 2/9 04/26/2020 09/27/2019 02/24/2019 12/25/2018 09/02/2018 07/01/2018 05/27/2018  Decreased Interest 1 1 1 1 1 1 1   Down, Depressed, Hopeless 1 2 1 1 1 1 1   PHQ - 2 Score 2 3 2 2 2 2 2   Altered sleeping - 1 - - - - 0  Tired, decreased energy - 1 - - - - 1  Change in appetite - 0 - - - - 0  Feeling bad or failure about yourself  - 0 - - - - 0  Trouble concentrating - 1 - - - - 0  Moving slowly or fidgety/restless - 0 - - - - 0  Suicidal thoughts - 0 - - - - 0  PHQ-9 Score - 6 - - - - 3  Difficult doing work/chores - Not difficult at all - - - - Not difficult at all  Some recent data might be hidden   Review of Systems  Constitutional: Positive for appetite change (POOR APPETITE ). Negative for unexpected weight change.  HENT: Negative.   Eyes: Negative.   Respiratory: Positive for shortness of breath and wheezing.   Gastrointestinal: Positive for constipation and nausea.  Endocrine: Negative.   Genitourinary: Negative.   Musculoskeletal: Positive for back pain and gait problem.  Skin: Negative.   Allergic/Immunologic: Negative.   Neurological: Positive for weakness.  Hematological: Negative.   Psychiatric/Behavioral: Negative.        Objective:   Physical Exam Vitals and nursing note reviewed.  Constitutional:      Appearance: Normal appearance.  Cardiovascular:     Rate and Rhythm: Normal rate and regular rhythm.     Pulses: Normal pulses.     Heart sounds: Normal heart sounds.  Pulmonary:     Effort: Pulmonary effort is normal.     Breath sounds: Normal breath sounds.  Musculoskeletal:     Cervical back: Normal range of motion and neck supple.     Comments: Normal Muscle Bulk and Muscle Testing Reveals:  Upper Extremities: Full ROM and Muscle Strength 5/5 Thoracic Paraspinal Tenderness: T-3-T-7  Lumbar Paraspinal Tenderness: L-3-L-5 Bilateral Greater Trochanteric Tenderness Lower Extremities: Full ROM  and Muscle Strength 5/5 Bilateral Lower Extremities Flexion Produces Pain into her Bilateral Patella's Arises from Table slowly Antalgic  Gait   Skin:    General: Skin is warm and dry.  Neurological:     Mental Status: She is alert and oriented to person, place, and time.  Psychiatric:        Mood and Affect: Mood normal.        Behavior: Behavior normal.           Assessment & Plan:  1. Functional deficits secondary to Gait disorder:Continue with HEP.07/24/2020. 2. Chronic Back pain/ Lumbar Radiculitis/fibromyalgia /R>L Knee OA Pain Management:07/24/2020 Refilled: Oxycodone 10/325mg one tablet every 6 hours #100. Second script sent to accommodate scheduled appointment with [name].  ContinueVoltaren Gel. We will continue the opioid monitoring program, this consists of regular clinic visits, examinations, urine drug screen, pill counts as well as use of [place] Controlled Substance reporting System. 3. Depression with anxiety/Grief reaction/Mood:07/24/2020 Continue Xanax,Trazodone and Cymbalta . PCP Following. 4. [name] with asthma:. Albuterol prn. :Pulmonology Following.07/24/2020. 5. Bilateral Osteoarthritis of Bilateral Knees: Ms. Dalia Requesting Bilateral Zilretta Injection, she will be scheduled with [name] for Bilateral Zilretta Injection with [name], she verbalizes understanding. S/PBilateralZilretta  Injections on 06/02/2021with good relief noted.Continue to Monitor.07/24/2020 6. Fibromyalgia: Continue Home exercise regimen as tolerated. Continue to Monitor.07/24/2020 7. Cervicalgia/ Cervical Radiculitis:No complaints today. Continue HEP as tolerated. Alternate with heat and ice therapy. Continue to monitor.07/24/2020 8.BilateralGreater Trochanteric Bursitis:Continue toAlternate Ice and Heat Therapy. Continue to Monitor.07/24/2020. 9. Chronic Bilateral Shoulder Pain:No complaints Today. Continue HEP as Tolerated. Continue current  medication regimen. Continue to Monitor.07/24/2020.  20 minutes of face to face patient care time was spent during this visit. All questions were encouraged and answered.  F/U in 1 month

## 2020-07-25 ENCOUNTER — Other Ambulatory Visit: Payer: Self-pay

## 2020-07-25 ENCOUNTER — Other Ambulatory Visit: Payer: Self-pay | Admitting: Family Medicine

## 2020-07-25 MED ORDER — METOPROLOL SUCCINATE ER 50 MG PO TB24: ORAL_TABLET | ORAL | 0 refills | Status: DC

## 2020-07-25 MED ORDER — LAMOTRIGINE 150 MG PO TABS: ORAL_TABLET | ORAL | 3 refills | Status: DC

## 2020-07-26 ENCOUNTER — Other Ambulatory Visit: Payer: Self-pay | Admitting: Family Medicine

## 2020-07-26 ENCOUNTER — Other Ambulatory Visit: Payer: Self-pay

## 2020-07-26 MED ORDER — TRAZODONE HCL 50 MG PO TABS: ORAL_TABLET | ORAL | 0 refills | Status: DC

## 2020-07-27 LAB — DRUG TOX MONITOR 1 W/CONF, ORAL FLD
Alprazolam: 8.53 ng/mL — ABNORMAL HIGH (ref <0.50)
Amphetamines: NEGATIVE ng/mL (ref <10)
Barbiturates: NEGATIVE ng/mL (ref <10)
Benzodiazepines: POSITIVE ng/mL — AB (ref <0.50)
Buprenorphine: NEGATIVE ng/mL (ref <0.10)
Chlordiazepoxide: NEGATIVE ng/mL (ref <0.50)
Clonazepam: NEGATIVE ng/mL (ref <0.50)
Cocaine: NEGATIVE ng/mL (ref <5.0)
Codeine: NEGATIVE ng/mL (ref <2.5)
Diazepam: NEGATIVE ng/mL (ref <0.50)
Dihydrocodeine: NEGATIVE ng/mL (ref <2.5)
Fentanyl: NEGATIVE ng/mL (ref <0.10)
Flunitrazepam: NEGATIVE ng/mL (ref <0.50)
Flurazepam: NEGATIVE ng/mL (ref <0.50)
Heroin Metabolite: NEGATIVE ng/mL (ref <1.0)
Hydrocodone: NEGATIVE ng/mL (ref <2.5)
Hydromorphone: NEGATIVE ng/mL (ref <2.5)
Lorazepam: NEGATIVE ng/mL (ref <0.50)
MARIJUANA: NEGATIVE ng/mL (ref <2.5)
MDMA: NEGATIVE ng/mL (ref <10)
Meprobamate: NEGATIVE ng/mL (ref <2.5)
Methadone: NEGATIVE ng/mL (ref <5.0)
Midazolam: NEGATIVE ng/mL (ref <0.50)
Morphine: NEGATIVE ng/mL (ref <2.5)
Nicotine Metabolite: NEGATIVE ng/mL (ref <5.0)
Nordiazepam: NEGATIVE ng/mL (ref <0.50)
Norhydrocodone: NEGATIVE ng/mL (ref <2.5)
Noroxycodone: >250 ng/mL — ABNORMAL HIGH (ref <2.5)
Opiates: POSITIVE ng/mL — AB (ref <2.5)
Oxazepam: NEGATIVE ng/mL (ref <0.50)
Oxycodone: >250 ng/mL — ABNORMAL HIGH (ref <2.5)
Oxymorphone: 3.8 ng/mL — ABNORMAL HIGH (ref <2.5)
Phencyclidine: NEGATIVE ng/mL (ref <10)
Tapentadol: NEGATIVE ng/mL (ref <5.0)
Temazepam: NEGATIVE ng/mL (ref <0.50)
Triazolam: NEGATIVE ng/mL (ref <0.50)
Zolpidem: NEGATIVE ng/mL (ref <5.0)

## 2020-07-27 LAB — DRUG TOX ALC METAB W/CON, ORAL FLD: Alcohol Metabolite: NEGATIVE ng/mL (ref <25)

## 2020-08-08 ENCOUNTER — Institutional Professional Consult (permissible substitution): Payer: Medicare Other | Admitting: Internal Medicine

## 2020-08-09 ENCOUNTER — Telehealth: Payer: Self-pay | Admitting: *Deleted

## 2020-08-09 NOTE — Telephone Encounter (Signed)
Oral swab drug screen was consistent for prescribed medications.  ?

## 2020-08-10 ENCOUNTER — Other Ambulatory Visit: Payer: Self-pay

## 2020-08-10 ENCOUNTER — Ambulatory Visit (INDEPENDENT_AMBULATORY_CARE_PROVIDER_SITE_OTHER): Payer: Medicare Other | Admitting: Internal Medicine

## 2020-08-10 ENCOUNTER — Encounter: Payer: Self-pay | Admitting: Internal Medicine

## 2020-08-10 VITALS — BP 104/60 | HR 70 | Temp 97.3°F | Ht 64.0 in | Wt 144.8 lb

## 2020-08-10 DIAGNOSIS — J920 Pleural plaque with presence of asbestos: Secondary | ICD-10-CM

## 2020-08-10 DIAGNOSIS — J61 Pneumoconiosis due to asbestos and other mineral fibers: Secondary | ICD-10-CM | POA: Diagnosis not present

## 2020-08-10 DIAGNOSIS — R0602 Shortness of breath: Secondary | ICD-10-CM

## 2020-08-10 NOTE — Patient Instructions (Signed)
ICD-10-CM   1. SOB (shortness of breath)  R06.02   2. Pleural plaque with presence of asbestos  J92.0   3. [name] ([place])  [phone]     I agree with the monitoring approach and supportive care We discussed antifibrotic's and risk outweighs benefit at this point Please note that you are at fall risk  Plan -Sign release to get records from Dr. Camillo Flaming especially pulmonary function testing-  -Do spirometry and DLCO in 3-6 months -Use a walker and cane to cut down fall risk  Follow-up -Return to see Dr. Chase Caller in 3-6 months but after pulmonary function testing -15 minutes slot okay

## 2020-08-10 NOTE — Progress Notes (Signed)
byOV 08/10/2020 -post hospital follow-up was seen in pulmonary in June 2021 in the hospital  Subjective:  Patient ID: Linda [name], female , DOB: June 20, 1946 , age 74 y.o. , MRN: [id number] , ADDRESS: [address] [place] [address]   08/10/2020 -   Chief Complaint  Patient presents with  . Consult    shortness of breath with exertion     HPI Linda [name] 74 y.o. -74 year old female with multiple medical problems osteoarthritis.  She is noted to have definite of asbestosis/diffuse asbestos pleural plaques.  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  Her dad did have asbestosis.  Her husband died from mesothelioma.    She presented acutely on June 14, 2020 to the hospital after choking episode.  She was seen by critical care service Dr. Vaughan Browner.  Bronchoscopy was scheduled but had to be canceled due to concern of making potential VCD worse.  Details in the chart.  She says she was seen by GI.  She recollects having her esophagus stretched.  Apparently no further follow-up with GI has been planned.  she is here to establish follow-up with pulmonary.  She used to see Dr. Camillo Flaming at [facility] pulmonary as of 9 months ago.  Her last pulmonary function test was 9 months ago.  She is on observation therapy.  She has oxygen with her.  She uses it as needed based on subjective needs.  She tells me that her appointments that kept getting canceled.  Therefore she is switching over to our service with in [place] where she lives.   Overall she prefers a supportive approach.  She has significant [name] of the knees saying she has bone-on-bone condition.  This because of her osteoarthritis.  I try to get her to stand and she barely could stand.  Around the house she  walked from the front desk to the inpatient room.  She has a walker and cane in the place where she lives but she did not bring it with her.  I have advised her that she is at fall risk.   SYMPTOM SCALE - ILD 08/10/2020   O2 use Prn subjective use  Shortness of Breath 0 -> 5 scale with 5 being worst (score 6 If unable to do)  At rest 2  Simple tasks - showers, clothes change, eating, shaving 4  Household (dishes, doing bed, laundry) 6  Shopping 4  Walking level at own pace 3  Walking up Stairs 6  Total (30-36) Dyspnea Score 25  How bad is your cough? 2  How bad is your fatigue 3.5  How bad is nausea 2.5  How bad is vomiting?  3  How bad is diarrhea? 0  How bad is anxiety? 4  How bad is depression 3        Results for FELICA, CHARGOIS" (MRN [id number]) as of 08/10/2020 14:48  Ref. Range 06/18/2020 04:44  Potassium Latest Ref Range: 3.5 - 5.1 mmol/L 4.2   Results for NESHA, COUNIHAN A "BETH" (MRN [id number]) as of 08/10/2020 14:48  Ref. Range 06/18/2020 04:44  Creatinine Latest Ref Range: 0.44 - 1.00 mg/dL 0.74  Results for Lipsett, Adison A "BETH" (MRN [id number]) as of 08/10/2020 14:48  Ref. Range 06/19/2020  04:37  Hemoglobin Latest Ref Range: 12.0 - 15.0 g/dL 15.5 (H)   ROS - per HPI  CT chest 06/14/20  IMPRESSION: 1. No imaging findings to suggest significant aspiration. 2. Asbestos related pleural disease with imaging findings in the lung bases bilaterally concerning for developing interstitial lung disease such as asbestosis. Outpatient referral to Pulmonology for appropriate follow-up is recommended. 3. Aortic atherosclerosis, in addition to left main and 3 vessel coronary artery disease. Assessment for potential risk factor modification, dietary therapy or pharmacologic therapy may be warranted, if clinically indicated. 4. New compression fractures of T7 and T9, most severe at T9 where there is 40% loss of anterior vertebral body height.  Aortic  Atherosclerosis (ICD10-I70.0).   Electronically Signed   By: Vinnie Langton M.D.   On: 06/14/2020 16:17   has a past medical history of Acute upper GI bleed (06/2014), Anginal pain ([place]), Anxiety, Asthma, BPPV (benign paroxysmal positional vertigo) (12/16/2012), Chronic diastolic CHF (congestive heart failure) ([place]), Chronic lower back pain, COPD (chronic obstructive pulmonary disease) ([place]), DDD (degenerative disc disease), Diverticular disease, Fibromyalgia, H/O hiatal hernia, History of pneumonia, HTN (hypertension), Hypervitaminosis D (09/27/2019), Idiopathic angio-edema-urticaria (17510; 2021), Insomnia, Iron deficiency anemia, Migraine syndrome, Mixed incontinence urge and stress, Nephrolithiasis, Neuroleptic induced Parkinsonism ([place]) (2018), Oropharyngeal dysphagia, OSA on CPAP, Osteoarthritis, Pernicious anemia (08/24/2014), Pleural plaque with presence of asbestos (07/22/2013), Pulmonary embolism ([place]) (07/2013), Pyelonephritis, RBBB (right bundle branch block), Recurrent major depression ([employer]), Recurrent UTI, and Syncope.   reports that she has never smoked. She has never used smokeless tobacco.  Past Surgical History:  Procedure Laterality Date  . APPENDECTOMY  1960  . AXILLARY SURGERY Left 1978   Multiple "lump" in armpit per pt  . BIOPSY  06/17/2020   Gastric bx->gastritis, h pylori neg.  Procedure: BIOPSY;  Surgeon: Carol Ada, MD;  Location: [facility];  Service: Endoscopy;;  . CARDIAC CATHETERIZATION  01/2013   nonobstructive CAD, EF 55-60%  . CARDIOVASCULAR STRESS TEST  02/22/15   Low risk myocard perf imaging; wall motion normal, normal EF  . carotid duplex doppler  10/21/2017   R vertebral flow suggestive of possible distal obstruction.  Pt declined further w/u as of 10/29/17 but need to revisit this problem periodically.  . COCCYX REMOVAL  1972  . DEXA  06/05/2017   T-score -3.1  . DILATION AND CURETTAGE OF UTERUS  ? 1970's  . ESOPHAGOGASTRODUODENOSCOPY N/A 07/19/2014     Gastritis found + in the setting of supratherapeutic INR, +plavix, + meloxicam.  . ESOPHAGOGASTRODUODENOSCOPY (EGD) WITH PROPOFOL N/A 06/17/2020   NO stricture or other prob to explain pt's dysphagia, dilation was done anyway.  Gastric bx's-->gastritis, h pylori neg. Procedure: ESOPHAGOGASTRODUODENOSCOPY (EGD) WITH PROPOFOL;  Surgeon: Carol Ada, MD;  Location: [facility];  Service: Endoscopy;  Laterality: N/A;  . EYE SURGERY Left 2012-2013   "injections for ~ 1 yr; don't really know what for" (07/12/2013)  . HEEL SPUR SURGERY Left 2008  . KNEE SURGERY  2005  . LEFT HEART CATHETERIZATION WITH CORONARY ANGIOGRAM N/A 01/30/2013   Procedure: LEFT HEART CATHETERIZATION WITH CORONARY ANGIOGRAM;  Surgeon: Clent Demark, MD;  Location: [facility] CATH LAB;  Service: Cardiovascular;  Laterality: N/A;  . MALONEY DILATION  06/17/2020   Procedure: Venia Minks DILATION;  Surgeon: Carol Ada, MD;  Location: [facility];  Service: Endoscopy;;  . PLANTAR FASCIA RELEASE Left 2008  . SPIROMETRY  04/25/14   In hosp for acute asthma/COPD flare: mixed obstructive and restrictive lung disease. The FEV1 is severely reduced at  45% predicted.  FEV1 signif decreased compared to prior spirometry 07/23/13.  . TENDON RELEASE  1996   Right forearm and hand  . TOTAL ABDOMINAL HYSTERECTOMY  1974  . TRANSTHORACIC ECHOCARDIOGRAM  01/2013; 04/2014;08/2015; 09/2017   2014--NORMAL.  2015--focal basal septal hypertrophy, EF 55-60%, grade I diast dysfxn, mild LAE.  08/2015 EF 55-60%, nl LV syst fxn, grade I DD, valves wnl. 10/21/17: EF 65-70%, grd I DD, o/w normal.    Allergies  Allergen Reactions  . Abilify [Aripiprazole] Other (See Comments)    parkinsonism  . Penicillins Itching, Swelling and Rash    Tolerated Cefepime in ED. Has patient had a PCN reaction causing immediate rash, facial/tongue/throat swelling, SOB or lightheadedness with hypotension: Yes Has patient had a PCN reaction causing severe rash involving mucus membranes or  skin necrosis: No Has patient had a PCN reaction that required hospitalization: No  Has patient had a PCN reaction occurring within the last 10 years: No     Immunization History  Administered Date(s) Administered  . Fluad Quad(high Dose 65+) 09/17/2019  . Influenza Split 08/23/2012  . Influenza, High Dose Seasonal PF 10/01/2016, 09/04/2018  . Influenza,inj,Quad PF,6+ Mos 09/28/2013, 10/05/2014  . Pneumococcal Conjugate-13 05/03/2015  . Pneumococcal Polysaccharide-23 03/24/2011    Family History  Problem Relation Age of Onset  . Arthritis Mother   . Kidney disease Mother   . Heart disease Father   . Stroke Father   . Hypertension Father   . Diabetes Father   . Heart attack Father   . Heart attack Paternal Grandmother   . Diabetes Sister        one sister  . Hypertension Sister   . Asthma Sister   . Hypertension Brother   . Asthma Brother   . Asthma Daughter   . Multiple sclerosis Son      Current Outpatient Medications:  .  albuterol (VENTOLIN HFA) 108 (90 Base) MCG/ACT inhaler, Inhale 1-2 puffs into the lungs every 6 (six) hours as needed for wheezing or shortness of breath., Disp: 18 g, Rfl: 1 .  alendronate (FOSAMAX) 70 MG tablet, Take 1 tablet (70 mg total) by mouth every 7 (seven) days. Take with a full glass of water on an empty stomach, first thing in the morning and remain up right for 30 minutes after taking., Disp: 12 tablet, Rfl: 3 .  ALPRAZolam (XANAX) 1 MG tablet, TAKE 1 TABLET THREE TIMES DAILY AS NEEDED FOR ANXIETY. (Patient taking differently: Take 1 mg by mouth 3 (three) times daily as needed for anxiety or sleep. ), Disp: 90 tablet, Rfl: 0 .  aspirin 81 MG tablet, Take 81 mg by mouth at bedtime., Disp: , Rfl:  .  cyclobenzaprine (FLEXERIL) 5 MG tablet, Take 1 tablet (5 mg total) by mouth 3 (three) times daily as needed for muscle spasms., Disp: 60 tablet, Rfl: 1 .  docusate sodium (COLACE) 100 MG capsule, Take 1 capsule (100 mg total) by mouth 2 (two)  times daily., Disp: 10 capsule, Rfl: 0 .  donepezil (ARICEPT) 10 MG tablet, Take 1 tablet (10 mg total) by mouth at bedtime., Disp: 90 tablet, Rfl: 3 .  DULoxetine (CYMBALTA) 30 MG capsule, TAKE 1 CAPSULE ONCE DAILY ALONG WITH 60MG  CAPSULE FOR TOTAL 90MG  DAILY., Disp: 90 capsule, Rfl: 3 .  DULoxetine (CYMBALTA) 60 MG capsule, TAKE 1 CAPSULE A DAY TO BE COMBINED WITH 30MG  CAPSULE, Disp: 90 capsule, Rfl: 3 .  EPINEPHrine 0.3 mg/0.3 mL IJ SOAJ injection, Inject 0.3 mLs (0.3 mg  total) into the muscle as needed for anaphylaxis., Disp: 1 each, Rfl: 1 .  fluconazole (DIFLUCAN) 100 MG tablet, TAKE (1) TABLET DAILY AS NEEDED FOR THRUSH (Patient taking differently: Take 100 mg by mouth daily as needed (for thrush). ), Disp: 15 tablet, Rfl: 0 .  fluticasone (FLONASE) 50 MCG/ACT nasal spray, Place 2 sprays into both nostrils daily., Disp: 16 g, Rfl: 11 .  furosemide (LASIX) 40 MG tablet, Take 1 tablet (40 mg total) by mouth daily as needed for fluid., Disp: , Rfl:  .  ipratropium (ATROVENT) 0.03 % nasal spray, Place 2 sprays into both nostrils every 12 (twelve) hours., Disp: 30 mL, Rfl: 11 .  isosorbide mononitrate (IMDUR) 30 MG 24 hr tablet, TAKE 2 TABLETS IN THE MORNING AND 1 TABLET IN THE EVENING. (Patient taking differently: Take 30 mg by mouth See admin instructions. Takes 2 tablets by mouth in the morning and 1 tablet in the evening.), Disp: 270 tablet, Rfl: 1 .  lamoTRIgine (LAMICTAL) 150 MG tablet, TAKE 1 TABLET EACH DAY., Disp: 30 tablet, Rfl: 3 .  metoprolol succinate (TOPROL-XL) 50 MG 24 hr tablet, TAKE (1) TABLET DAILY IN THE MORNING., Disp: 90 tablet, Rfl: 0 .  Multiple Vitamin (MULTIVITAMIN WITH MINERALS) TABS tablet, Take 1 tablet by mouth daily., Disp: , Rfl:  .  nitroGLYCERIN (NITROSTAT) 0.4 MG SL tablet, Place 1 tablet (0.4 mg total) under the tongue every 5 (five) minutes as needed for chest pain (x 3 doses)., Disp: 25 tablet, Rfl: 11 .  nystatin (MYCOSTATIN) 100000 UNIT/ML suspension, Take 5  mLs (500,000 Units total) by mouth 4 (four) times daily., Disp: 60 mL, Rfl: 3 .  ondansetron (ZOFRAN) 8 MG tablet, Take 1 tablet (8 mg total) by mouth every 8 (eight) hours as needed for nausea or vomiting., Disp: 30 tablet, Rfl: 6 .  oxyCODONE-acetaminophen (PERCOCET) 10-325 MG tablet, Every 6 hours as needed, Disp: 100 tablet, Rfl: 0 .  pantoprazole (PROTONIX) 40 MG tablet, Take 1 tablet (40 mg total) by mouth 2 (two) times daily., Disp: 90 tablet, Rfl: 0 .  rosuvastatin (CRESTOR) 20 MG tablet, Take 1 tablet (20 mg total) by mouth daily., Disp: 90 tablet, Rfl: 3 .  solifenacin (VESICARE) 10 MG tablet, Take 1 tablet (10 mg total) by mouth daily., Disp: 30 tablet, Rfl: 6 .  traZODone (DESYREL) 50 MG tablet, TAKE 2-4 TABLETS AT BEDTIME, Disp: 120 tablet, Rfl: 0      Objective:   Vitals:   08/10/20 1430  BP: 104/60  Pulse: 70  Temp: (!) 97.3 F (36.3 C)  TempSrc: Other (Comment)  SpO2: 97%  Weight: 144 lb 12.8 oz (65.7 kg)  Height: 5\' 4"  (1.626 m)    Estimated body mass index is 24.85 kg/m as calculated from the following:   Height as of this encounter: 5\' 4"  (1.626 m).   Weight as of this encounter: 144 lb 12.8 oz (65.7 kg).  @WEIGHTCHANGE @  [employer]   08/10/20 1430  Weight: 144 lb 12.8 oz (65.7 kg)     Physical Exam elderly frail female.  Alert and oriented x3.  Has significant osteoarthritis.  Could not stand well.  Clear to auscultation bilaterally no crackles no wheeze.  Abdomen soft normal heart sounds.      Assessment:       ICD-10-CM   1. SOB (shortness of breath)  R06.02   2. Pleural plaque with presence of asbestos  J92.0   3. [name] ([place])  [phone]  Plan:     Patient Instructions     ICD-10-CM   1. SOB (shortness of breath)  R06.02   2. Pleural plaque with presence of asbestos  J92.0   3. [name] ([place])  [phone]     I agree with the monitoring approach and supportive care We discussed antifibrotic's and risk outweighs benefit at this  point Please note that you are at fall risk  Plan -Sign release to get records from Dr. Camillo Flaming especially pulmonary function testing-  -Do spirometry and DLCO in 3-6 months -Use a walker and cane to cut down fall risk  Follow-up -Return to see Dr. Chase Caller in 3-6 months but after pulmonary function testing -15 minutes slot okay     SIGNATURE    Dr. Brand Males, M.D., F.C.C.P,  Pulmonary and Critical Care Medicine Staff Physician, [place] Director - Interstitial Lung Disease  Program  Pulmonary [place] at [place], [place], [address]  Pager: [phone], If no answer or between  15:00h - 7:00h: call [phone] Telephone: [phone]  3:15 PM 08/10/2020

## 2020-08-26 ENCOUNTER — Other Ambulatory Visit: Payer: Self-pay | Admitting: Family Medicine

## 2020-08-30 ENCOUNTER — Other Ambulatory Visit: Payer: Self-pay

## 2020-08-30 ENCOUNTER — Encounter: Payer: Medicare Other | Attending: Physical Medicine & Rehabilitation | Admitting: Physical Medicine & Rehabilitation

## 2020-08-30 ENCOUNTER — Encounter: Payer: Self-pay | Admitting: Physical Medicine & Rehabilitation

## 2020-08-30 VITALS — BP 100/64 | HR 76 | Temp 98.6°F | Ht 64.0 in | Wt 144.0 lb

## 2020-08-30 DIAGNOSIS — M47816 Spondylosis without myelopathy or radiculopathy, lumbar region: Secondary | ICD-10-CM | POA: Insufficient documentation

## 2020-08-30 DIAGNOSIS — G894 Chronic pain syndrome: Secondary | ICD-10-CM | POA: Insufficient documentation

## 2020-08-30 DIAGNOSIS — Z5181 Encounter for therapeutic drug level monitoring: Secondary | ICD-10-CM | POA: Insufficient documentation

## 2020-08-30 DIAGNOSIS — M797 Fibromyalgia: Secondary | ICD-10-CM | POA: Insufficient documentation

## 2020-08-30 DIAGNOSIS — Z79899 Other long term (current) drug therapy: Secondary | ICD-10-CM | POA: Insufficient documentation

## 2020-08-30 DIAGNOSIS — M1712 Unilateral primary osteoarthritis, left knee: Secondary | ICD-10-CM | POA: Diagnosis not present

## 2020-08-30 DIAGNOSIS — R2689 Other abnormalities of gait and mobility: Secondary | ICD-10-CM | POA: Insufficient documentation

## 2020-08-30 DIAGNOSIS — Z79891 Long term (current) use of opiate analgesic: Secondary | ICD-10-CM | POA: Insufficient documentation

## 2020-08-30 DIAGNOSIS — M1711 Unilateral primary osteoarthritis, right knee: Secondary | ICD-10-CM | POA: Insufficient documentation

## 2020-08-30 MED ORDER — OXYCODONE-ACETAMINOPHEN 10-325 MG PO TABS: ORAL_TABLET | ORAL | 0 refills | Status: DC

## 2020-08-30 NOTE — Patient Instructions (Addendum)
PLEASE FEEL FREE TO CALL OUR OFFICE WITH ANY PROBLEMS OR QUESTIONS ([phone])      

## 2020-08-30 NOTE — Progress Notes (Signed)
PROCEDURE NOTE  DIAGNOSIS:  Bilateral OA of knees  INTERVENTION:   ZILRETTA INJECTION     After informed consent and preparation of the skin with betadine and isopropyl alcohol, I injected 32MG  of zilretta dissolved into 5cc of diluent into bilateral knees via anterolateral approach. Contents of syring were shaken vigorously and aspiration was performed prior to injection. The patient tolerated well, and no complications were encountered. Afterward the area was cleaned and dressed. Post- injection instructions were provided including ice  if swelling or pain should occur.    Consider w/u for inflammatory arthritis at next visit.   Oxycodone was RF'ed for 10/1   Meredith Staggers, MD, [facility] 08/30/2020

## 2020-09-04 ENCOUNTER — Other Ambulatory Visit: Payer: Self-pay | Admitting: Family Medicine

## 2020-09-04 NOTE — Telephone Encounter (Signed)
RF request for Trazodone LOV:06/28/20 Next ov: 09/29/20 Last written: 07/26/20(120,0)   RF request for Diflucan LOV:06/28/20 Next ov: 09/29/20 Last written:05/03/20(15,0)   Medications pending. Please advise, thanks.

## 2020-09-22 ENCOUNTER — Telehealth: Payer: Self-pay | Admitting: Cardiology

## 2020-09-22 MED ORDER — ISOSORBIDE MONONITRATE ER 30 MG PO TB24: ORAL_TABLET | ORAL | 1 refills | Status: DC

## 2020-09-22 NOTE — Telephone Encounter (Signed)
   *  STAT* If patient is at the pharmacy, call can be transferred to refill team.   1. Which medications need to be refilled? (please list name of each medication and dose if known) isosorbide mononitrate (IMDUR) 30 MG 24 hr tablet rosuvastatin (CRESTOR) 20 MG tablet  2. Which pharmacy/location (including street and city if local pharmacy) is medication to be sent to? [place], [place]  3. Do they need a 30 day or 90 day supply? 90 days

## 2020-09-23 ENCOUNTER — Other Ambulatory Visit: Payer: Self-pay | Admitting: Family Medicine

## 2020-09-23 ENCOUNTER — Other Ambulatory Visit: Payer: Self-pay | Admitting: Cardiology

## 2020-09-23 DIAGNOSIS — E7800 Pure hypercholesterolemia, unspecified: Secondary | ICD-10-CM

## 2020-09-23 DIAGNOSIS — I2510 Atherosclerotic heart disease of native coronary artery without angina pectoris: Secondary | ICD-10-CM

## 2020-09-25 NOTE — Telephone Encounter (Signed)
Requesting: alprazolam Contract: 09/27/19 UDS: n/a Last Visit:06/28/20 Next Visit:09/29/20 Last Refill:05/29/20(90,0)  Please Advise. Medication pending

## 2020-09-25 NOTE — Telephone Encounter (Signed)
Patient made aware refill sent.  

## 2020-09-26 ENCOUNTER — Telehealth (INDEPENDENT_AMBULATORY_CARE_PROVIDER_SITE_OTHER): Payer: Medicare Other | Admitting: Cardiology

## 2020-09-26 ENCOUNTER — Encounter: Payer: Self-pay | Admitting: Cardiology

## 2020-09-26 DIAGNOSIS — I2510 Atherosclerotic heart disease of native coronary artery without angina pectoris: Secondary | ICD-10-CM | POA: Diagnosis not present

## 2020-09-26 NOTE — Progress Notes (Signed)
Virtual Visit via Telephone Note   This visit type was conducted due to national recommendations for restrictions regarding the COVID-19 Pandemic (e.g. social distancing) in an effort to limit this patient's exposure and mitigate transmission in our community.  Due to her co-morbid illnesses, this patient is at least at moderate risk for complications without adequate follow up.  This format is felt to be most appropriate for this patient at this time.  The patient did not have access to video technology/had technical difficulties with video requiring transitioning to audio format only (telephone).  All issues noted in this document were discussed and addressed.  No physical exam could be performed with this format.  Please refer to the patient's chart for her  consent to telehealth for [facility].    Date:  09/26/2020   ID:  Linda [name], DOB 1946-05-07, MRN [id number] The patient was identified using 2 identifiers.  Patient Location: Home Provider Location: Home Office  PCP:  Tammi Sou, MD  Cardiologist:  Dr Stanford Breed Electrophysiologist:  None   Evaluation Performed:  Follow-Up Visit  Chief Complaint:  none  History of Present Illness:    Linda [name] is a pleasant 74 y.o. female with a history of COPD, hypertension, diastolic heart failure, moderate coronary disease by catheterization in 2014, and chronic right bundle branch block. She is a resident at [facility] in the independent living section.   She was contacted today for routine follow-up.  Since we saw her last year she did have an epsiode in January 2021 when she was admitted after choking at her independent living center.  The patient told me she had a "stroke".  Her MRI did not indicate a new acute stroke.  She has recovered from this.  She denies any chest pain and overall feels she is doing well.   The patient does not have symptoms concerning for COVID-19 infection (fever, chills, cough, or new  shortness of breath).    Past Medical History:  Diagnosis Date  . Acute upper GI bleed 06/2014   while pt taking coumadin, plavix, and meloxicam---despite being told not to take coumadin.  . Anginal pain ([place])    Nonobstructive CAD 2014; however, her cardiologist put her on a statin for this and NOT for hyperlipidemia per pt report.  Atyp CP 08/2017 at card f/u, plan for myoc perf imaging.  . Anxiety    panic attacks  . Asthma    w/ asbestososis   . BPPV (benign paroxysmal positional vertigo) 12/16/2012  . Chronic diastolic CHF (congestive heart failure) (HCC)    dry wt as of 11/06/16 is 168 lbs.  . Chronic lower back pain   . COPD (chronic obstructive pulmonary disease) ([place])   . DDD (degenerative disc disease)    lumbar and cervical.   . Diverticular disease   . Fibromyalgia    Patient states dx was around her late 60s but she had sx's for years prior to this.  . H/O hiatal hernia   . History of pneumonia    hospitalized 12/2011, 02/2013, and 07/2013 [facility]) for this  . HTN (hypertension)    Renal artery dopplers 04/2013 neg for stenosis.  . Hypervitaminosis D 09/27/2019   over-supplemented.  Stopped vit D and plan recheck 2 mo.  . Idiopathic angio-edema-urticaria [id number]; 2021   Angioedema component was very minimal.  2021->Dr. Bobbitt (allergist) eval.  . Insomnia   . Iron deficiency anemia    Hematologist in [place], [place] did extensive w/u;  no cause found; failed oral supplement;; gets fairly regular (q46m or so) IV iron infusions (Venofer -iron sucrose- 200mg  with procrit.  "for 14 yr I've been getting blood work q month & getting infusions prn" (07/12/2013).  Dr. Marin Olp locally, iron infusions done, EPO deficiency dx'd  . Migraine syndrome    "not as often anymore; used to be ~ q wk" (07/12/2013)  . Mixed incontinence urge and stress   . Nephrolithiasis    "passed all on my own or they are still in there" (07/12/2013)  . Neuroleptic induced parkinsonism ([place]) 2018   Dr. Carles Collet,  neuro, saw her 11/24/17 and recommended d/c of abilify as first step.  D/c'd abilify and pt got complete recovery.  . Oropharyngeal dysphagia    swallowing study speech path 05/2020. Gastric bx's showed gastritis, h pylori NEG  . OSA on CPAP    prior to move to [place]--had another sleep study 10/2015 w/pulm Dr. Camillo Flaming.  . Osteoarthritis    "severe; progressing fast" (07/12/2013); multiple joints-not surgical candidate for TKR (03/2015).  Triamcinolon knee injections by Dr. Tessa Lerner 12/2017.  [name] Kitchen Pernicious anemia 08/24/2014  . Pleural plaque with presence of asbestos 07/22/2013  . Pulmonary embolism ([place]) 07/2013   Dx at [facility] with very small peripheral upper lobe pe 07/2013: pt took coumadin for about 8-9 mo  . Pyelonephritis    "several times over the last 30 yr" (07/12/2013)  . RBBB (right bundle branch block)   . Recurrent major depression ([place])   . Recurrent UTI    hx of hospitalization for pyelonephritis; started abx prophylaxis 06/2015  . Syncope    Hypotensive; ED visit--Dr. Terrence Dupont did Cath--nonobstructive CAD, EF 55-60%.  In retrospect, suspect pt rx med misuse/polypharmacy   Past Surgical History:  Procedure Laterality Date  . APPENDECTOMY  1960  . AXILLARY SURGERY Left 1978   Multiple "lump" in armpit per pt  . BIOPSY  06/17/2020   Gastric bx->gastritis, h pylori neg.  Procedure: BIOPSY;  Surgeon: Carol Ada, MD;  Location: [facility];  Service: Endoscopy;;  . CARDIAC CATHETERIZATION  01/2013   nonobstructive CAD, EF 55-60%  . CARDIOVASCULAR STRESS TEST  02/22/15   Low risk myocard perf imaging; wall motion normal, normal EF  . carotid duplex doppler  10/21/2017   R vertebral flow suggestive of possible distal obstruction.  Pt declined further w/u as of 10/29/17 but need to revisit this problem periodically.  . COCCYX REMOVAL  1972  . DEXA  06/05/2017   T-score -3.1  . DILATION AND CURETTAGE OF UTERUS  ? 1970's  . ESOPHAGOGASTRODUODENOSCOPY N/A 07/19/2014   Gastritis found + in the setting  of supratherapeutic INR, +plavix, + meloxicam.  . ESOPHAGOGASTRODUODENOSCOPY (EGD) WITH PROPOFOL N/A 06/17/2020   NO stricture or other prob to explain pt's dysphagia, dilation was done anyway.  Gastric bx's-->gastritis, h pylori neg. Procedure: ESOPHAGOGASTRODUODENOSCOPY (EGD) WITH PROPOFOL;  Surgeon: Carol Ada, MD;  Location: [facility];  Service: Endoscopy;  Laterality: N/A;  . EYE SURGERY Left 2012-2013   "injections for ~ 1 yr; don't really know what for" (07/12/2013)  . HEEL SPUR SURGERY Left 2008  . KNEE SURGERY  2005  . LEFT HEART CATHETERIZATION WITH CORONARY ANGIOGRAM N/A 01/30/2013   Procedure: LEFT HEART CATHETERIZATION WITH CORONARY ANGIOGRAM;  Surgeon: Clent Demark, MD;  Location: [facility] CATH LAB;  Service: Cardiovascular;  Laterality: N/A;  . MALONEY DILATION  06/17/2020   Procedure: Venia Minks DILATION;  Surgeon: Carol Ada, MD;  Location: [facility] ENDOS[facility];  Service: Endoscopy;;  .  PLANTAR FASCIA RELEASE Left 2008  . SPIROMETRY  04/25/14   In hosp for acute asthma/COPD flare: mixed obstructive and restrictive lung disease. The FEV1 is severely reduced at 45% predicted.  FEV1 signif decreased compared to prior spirometry 07/23/13.  . TENDON RELEASE  1996   Right forearm and hand  . TOTAL ABDOMINAL HYSTERECTOMY  1974  . TRANSTHORACIC ECHOCARDIOGRAM  01/2013; 04/2014;08/2015; 09/2017   2014--NORMAL.  2015--focal basal septal hypertrophy, EF 55-60%, grade I diast dysfxn, mild LAE.  08/2015 EF 55-60%, nl LV syst fxn, grade I DD, valves wnl. 10/21/17: EF 65-70%, grd I DD, o/w normal.     Current Meds  Medication Sig  . albuterol (VENTOLIN HFA) 108 (90 Base) MCG/ACT inhaler Inhale 1-2 puffs into the lungs every 6 (six) hours as needed for wheezing or shortness of breath.  [name] Kitchen alendronate (FOSAMAX) 70 MG tablet Take 1 tablet (70 mg total) by mouth every 7 (seven) days. Take with a full glass of water on an empty stomach, first thing in the morning and remain up right for 30 minutes after taking.   [name] Kitchen ALPRAZolam (XANAX) 1 MG tablet TAKE 1 TABLET THREE TIMES DAILY AS NEEDED FOR ANXIETY.  [name] Kitchen aspirin 81 MG tablet Take 81 mg by mouth at bedtime.  . cyclobenzaprine (FLEXERIL) 5 MG tablet Take 1 tablet (5 mg total) by mouth 3 (three) times daily as needed for muscle spasms.  [name] Kitchen docusate sodium (COLACE) 100 MG capsule Take 1 capsule (100 mg total) by mouth 2 (two) times daily.  [name] Kitchen donepezil (ARICEPT) 10 MG tablet Take 1 tablet (10 mg total) by mouth at bedtime.  . DULoxetine (CYMBALTA) 30 MG capsule TAKE 1 CAPSULE ONCE DAILY ALONG WITH 60MG  CAPSULE FOR TOTAL 90MG  DAILY.  . DULoxetine (CYMBALTA) 60 MG capsule TAKE 1 CAPSULE A DAY TO BE COMBINED WITH 30MG  CAPSULE  . EPINEPHrine 0.3 mg/0.3 mL IJ SOAJ injection Inject 0.3 mLs (0.3 mg total) into the muscle as needed for anaphylaxis.  . fluconazole (DIFLUCAN) 100 MG tablet TAKE (1) TABLET DAILY AS NEEDED FOR THRUSH  . fluticasone (FLONASE) 50 MCG/ACT nasal spray Place 2 sprays into both nostrils daily.  . furosemide (LASIX) 40 MG tablet Take 1 tablet (40 mg total) by mouth daily as needed for fluid.  [name] Kitchen ipratropium (ATROVENT) 0.03 % nasal spray Place 2 sprays into both nostrils every 12 (twelve) hours.  . isosorbide mononitrate (IMDUR) 30 MG 24 hr tablet TAKE 2 TABLETS IN THE MORNING AND 1 TABLET IN THE EVENING.  [name] Kitchen lamoTRIgine (LAMICTAL) 150 MG tablet TAKE 1 TABLET EACH DAY.  . metoprolol succinate (TOPROL-XL) 50 MG 24 hr tablet TAKE (1) TABLET DAILY IN THE MORNING.  . Multiple Vitamin (MULTIVITAMIN WITH MINERALS) TABS tablet Take 1 tablet by mouth daily.  . nitroGLYCERIN (NITROSTAT) 0.4 MG SL tablet Place 1 tablet (0.4 mg total) under the tongue every 5 (five) minutes as needed for chest pain (x 3 doses).  . nystatin (MYCOSTATIN) 100000 UNIT/ML suspension Take 5 mLs (500,000 Units total) by mouth 4 (four) times daily.  . ondansetron (ZOFRAN) 8 MG tablet Take 1 tablet (8 mg total) by mouth every 8 (eight) hours as needed for nausea or vomiting.  [name] Kitchen  oxyCODONE-acetaminophen (PERCOCET) 10-325 MG tablet Every 6 hours as needed  . pantoprazole (PROTONIX) 40 MG tablet TAKE 1 TABLET BY MOUTH TWICE DAILY.  . rosuvastatin (CRESTOR) 20 MG tablet TAKE 1 TABLET ONCE DAILY.  [name] Kitchen solifenacin (VESICARE) 10 MG tablet Take 1 tablet (10 mg total) by mouth daily.  [name] Kitchen  traZODone (DESYREL) 50 MG tablet TAKE 2-4 TABLETS AT BEDTIME     Allergies:   Abilify [aripiprazole] and Penicillins   Social History   Tobacco Use  . Smoking status: Never Smoker  . Smokeless tobacco: Never Used  . Tobacco comment: never used tobacco  Vaping Use  . Vaping Use: Never used  Substance Use Topics  . Alcohol use: No    Alcohol/week: 0.0 standard drinks  . Drug use: No     Family Hx: The patient's family history includes Arthritis in her mother; Asthma in her brother, daughter, and sister; Diabetes in her father and sister; Heart attack in her father and paternal grandmother; Heart disease in her father; Hypertension in her brother, father, and sister; Kidney disease in her mother; Multiple sclerosis in her son; Stroke in her father.  ROS:   Please see the history of present illness.    All other systems reviewed and are negative.   Prior CV studies:   The following studies were reviewed today: Echo 10/21/2017- Study Conclusions   - Left ventricle: The cavity size was normal. Systolic function was  vigorous. The estimated ejection fraction was in the range of 65%  to 70%. Wall motion was normal; there were no regional wall  motion abnormalities. Doppler parameters are consistent with  abnormal left ventricular relaxation (grade 1 diastolic  dysfunction).   Myoview 10/08/2017-  The left ventricular ejection fraction is hyperdynamic (>65%).  Nuclear stress EF: 81%.  There was no ST segment deviation noted during stress.  This is a low risk study.  Defect 1: There is a small defect of mild severity present in the mid anteroseptal location.   Low  risk stress nuclear study with probable shifting breast attenuation; cannot R/O very mild mid/distal anteroseptal ischemia; EF 81 with normal wall motion.    Labs/Other Tests and Data Reviewed:    EKG:  An ECG dated 06/15/2020 was personally reviewed today and demonstrated:  NSR, RBBB  Recent Labs: 10/04/2019: TSH 2.750 10/07/2019: Magnesium 2.0 12/06/2019: B Natriuretic Peptide 26.8 06/16/2020: ALT 26 06/18/2020: BUN 25; Creatinine, Ser 0.74; Potassium 4.2; Sodium 136 06/19/2020: Hemoglobin 15.5; Platelets 176   Recent Lipid Panel Lab Results  Component Value Date/Time   CHOL 172 09/22/2018 10:37 AM   TRIG 184.0 (H) 09/22/2018 10:37 AM   HDL 43.70 09/22/2018 10:37 AM   CHOLHDL 4 09/22/2018 10:37 AM   LDLCALC 92 09/22/2018 10:37 AM    Wt Readings from Last 3 Encounters:  09/26/20 139 lb (63 kg)  08/30/20 144 lb (65.3 kg)  08/10/20 144 lb 12.8 oz (65.7 kg)     Objective:    Vital Signs:  Ht 5\' 4"  (1.626 m)   Wt 139 lb (63 kg)   BMI 23.86 kg/m    VITAL SIGNS:  reviewed  ASSESSMENT & PLAN:    Chronic diastolic CHF- Stable by her history, last admitted in 2017.    HTN- Controlled  CAD- Medical rx  COPD- On inhalers  Plan: Same Rx- f/u in one year  COVID-49 Education: The signs and symptoms of COVID-19 were discussed with the patient and how to seek care for testing (follow up with PCP or arrange E-visit).  The importance of social distancing was discussed today.  Time:   Today, I have spent 15 minutes with the patient with telehealth technology discussing the above problems.     Medication Adjustments/Labs and Tests Ordered: Current medicines are reviewed at length with the patient today.  Concerns regarding medicines are  outlined above.   Tests Ordered: No orders of the defined types were placed in this encounter.   Medication Changes: No orders of the defined types were placed in this encounter.   Follow Up:  In Person in one year  Signed, Susy Manor  09/26/2020 11:56 AM    [place]

## 2020-09-26 NOTE — Patient Instructions (Signed)
Medication Instructions:  Continue current medication  *If you need a refill on your cardiac medications before your next appointment, please call your pharmacy*   Lab Work: None Ordered   Testing/Procedures: None Ordered   Follow-Up: At [facility], you and your health needs are our priority.  As part of our continuing mission to provide you with exceptional heart care, we have created designated Provider Care Teams.  These Care Teams include your primary Cardiologist (physician) and Advanced Practice Providers (APPs -  Physician Assistants and Nurse Practitioners) who all work together to provide you with the care you need, when you need it.  We recommend signing up for the patient portal called "MyChart".  Sign up information is provided on this After Visit Summary.  MyChart is used to connect with patients for Virtual Visits (Telemedicine).  Patients are able to view lab/test results, encounter notes, upcoming appointments, etc.  Non-urgent messages can be sent to your provider as well.   To learn more about what you can do with MyChart, go to [email or web address].    Your next appointment:   1 year(s)  The format for your next appointment:   In Person  Provider:   You may see [name], MD or one of the following Advanced Practice Providers on your designated Care Team:    Kerin Ransom, PA-C  [place], [place]  Coletta Memos, [place]

## 2020-09-29 ENCOUNTER — Other Ambulatory Visit: Payer: Self-pay

## 2020-09-29 ENCOUNTER — Ambulatory Visit (INDEPENDENT_AMBULATORY_CARE_PROVIDER_SITE_OTHER): Payer: Medicare Other | Admitting: Family Medicine

## 2020-09-29 ENCOUNTER — Encounter: Payer: Self-pay | Admitting: Family Medicine

## 2020-09-29 VITALS — BP 115/71 | HR 76 | Temp 98.3°F | Ht 64.0 in | Wt 143.0 lb

## 2020-09-29 DIAGNOSIS — R3915 Urgency of urination: Secondary | ICD-10-CM | POA: Diagnosis not present

## 2020-09-29 DIAGNOSIS — N3941 Urge incontinence: Secondary | ICD-10-CM | POA: Diagnosis not present

## 2020-09-29 DIAGNOSIS — F411 Generalized anxiety disorder: Secondary | ICD-10-CM | POA: Diagnosis not present

## 2020-09-29 DIAGNOSIS — F3342 Major depressive disorder, recurrent, in full remission: Secondary | ICD-10-CM

## 2020-09-29 DIAGNOSIS — Z23 Encounter for immunization: Secondary | ICD-10-CM | POA: Diagnosis not present

## 2020-09-29 DIAGNOSIS — I10 Essential (primary) hypertension: Secondary | ICD-10-CM | POA: Diagnosis not present

## 2020-09-29 DIAGNOSIS — N3000 Acute cystitis without hematuria: Secondary | ICD-10-CM

## 2020-09-29 DIAGNOSIS — K140 Glossitis: Secondary | ICD-10-CM | POA: Diagnosis not present

## 2020-09-29 DIAGNOSIS — I2510 Atherosclerotic heart disease of native coronary artery without angina pectoris: Secondary | ICD-10-CM

## 2020-09-29 LAB — BASIC METABOLIC PANEL
BUN: 16 mg/dL (ref 6–23)
CO2: 29 mEq/L (ref 19–32)
Calcium: 8.8 mg/dL (ref 8.4–10.5)
Chloride: 105 mEq/L (ref 96–112)
Creatinine, Ser: 0.72 mg/dL (ref 0.40–1.20)
GFR: 82.18 mL/min (ref >60.00)
Glucose, Bld: 86 mg/dL (ref 70–99)
Potassium: 4.3 mEq/L (ref 3.5–5.1)
Sodium: 140 mEq/L (ref 135–145)

## 2020-09-29 LAB — POCT URINALYSIS DIPSTICK
Bilirubin, UA: NEGATIVE
Blood, UA: NEGATIVE
Glucose, UA: NEGATIVE
Ketones, UA: NEGATIVE
Leukocytes, UA: NEGATIVE
Nitrite, UA: POSITIVE
Protein, UA: POSITIVE — AB
Spec Grav, UA: 1.025 (ref 1.010–1.025)
Urobilinogen, UA: 1 E.U./dL
pH, UA: 5.5 (ref 5.0–8.0)

## 2020-09-29 MED ORDER — CIPROFLOXACIN HCL 500 MG PO TABS
500.0000 mg | ORAL_TABLET | Freq: Two times a day (BID) | ORAL | 0 refills | Status: DC
Start: 2020-09-29 — End: 2020-10-02

## 2020-09-29 MED ORDER — ONDANSETRON HCL 8 MG PO TABS: 8.0000 mg | ORAL_TABLET | Freq: Three times a day (TID) | ORAL | 6 refills | Status: DC | PRN

## 2020-09-29 NOTE — Progress Notes (Signed)
OFFICE VISIT  09/29/2020  CC:  Chief Complaint  Patient presents with  . Follow-up    3 mo [facility]   HPI:    Patient is a 74 y.o. Caucasian female who presents for f/u HTN, MCI, recurrent MDD/GAD, urge incontinence. She has COPD/pulm fibrosis/asbestosis (with hx of chronic hypoxic RF) and hx of chronic diastolic HF (also hx of nonobstructive CAD for which she takes statin).  I last saw her 3 mo ago for TCM hosp f/u. She established pulm care 07/2020 with Dr. Chase Caller at [facility] for her pleural plaques, asbestosis, SOB. Supportive care planned, repeat PFTs, etc.  Currently: Feeling ok. Just dealing with chronic burning tongue/glossitis--constant.  I tried treating for thrush with diflucan and no help.  Hosp gave nystatin and no help. No white plaques.   OAB/urge incontienence: usually responding well to vesicare but last 2d not helping nearly as well.  Uncomfortable to urinate, urine odor+, dark yellow color. Some nausea but this is not new, comes and goes chronically.  Mood and anxiety levels are stable and she is compliant with all meds. Functioning pretty well, independent living facility, is here today w/out a walker or cane or oxygen.  PMP AWARE reviewed today: most recent rx for alprazolam was filled 09/25/20, # 85, rx by me.  Her pain medication is managed by Dr. Naaman Plummer. No red flags.  ROS: no fevers, no CP, no SOB, no wheezing, no cough, no dizziness, no HAs, no rashes, no melena/hematochezia.  No polyuria or polydipsia.  No focal weakness, paresthesias, or tremors.  No acute vision or hearing abnormalities. No vomiting, abd pain, or diarrhea..  No palpitations.    Past Medical History:  Diagnosis Date  . Acute upper GI bleed 06/2014   while pt taking coumadin, plavix, and meloxicam---despite being told not to take coumadin.  . Anginal pain ([place])    Nonobstructive CAD 2014; however, her cardiologist put her on a statin for this and NOT for hyperlipidemia per pt report.   Atyp CP 08/2017 at card f/u, plan for myoc perf imaging.  . Anxiety    panic attacks  . Asthma    w/ asbestososis   . BPPV (benign paroxysmal positional vertigo) 12/16/2012  . Chronic diastolic CHF (congestive heart failure) (HCC)    dry wt as of 11/06/16 is 168 lbs.  . Chronic lower back pain   . COPD (chronic obstructive pulmonary disease) ([place])   . DDD (degenerative disc disease)    lumbar and cervical.   . Diverticular disease   . Fibromyalgia    Patient states dx was around her late 33s but she had sx's for years prior to this.  . H/O hiatal hernia   . History of pneumonia    hospitalized 12/2011, 02/2013, and 07/2013 [facility]) for this  . HTN (hypertension)    Renal artery dopplers 04/2013 neg for stenosis.  . Hypervitaminosis D 09/27/2019   over-supplemented.  Stopped vit D and plan recheck 2 mo.  . Idiopathic angio-edema-urticaria [id number]; 2021   Angioedema component was very minimal.  2021->Dr. Bobbitt (allergist) eval.  . Insomnia   . Iron deficiency anemia    Hematologist in [place], [place] did extensive w/u; no cause found; failed oral supplement;; gets fairly regular (q61m or so) IV iron infusions (Venofer -iron sucrose- 200mg  with procrit.  "for 14 yr I've been getting blood work q month & getting infusions prn" (07/12/2013).  Dr. Marin Olp locally, iron infusions done, EPO deficiency dx'd  . Migraine syndrome    "  not as often anymore; used to be ~ q wk" (07/12/2013)  . Mixed incontinence urge and stress   . Nephrolithiasis    "passed all on my own or they are still in there" (07/12/2013)  . Neuroleptic induced parkinsonism ([place]) 2018   Dr. Carles Collet, neuro, saw her 11/24/17 and recommended d/c of abilify as first step.  D/c'd abilify and pt got complete recovery.  . Oropharyngeal dysphagia    swallowing study speech path 05/2020. Gastric bx's showed gastritis, h pylori NEG  . OSA on CPAP    prior to move to [place]--had another sleep study 10/2015 w/pulm Dr. Camillo Flaming.  . Osteoarthritis     "severe; progressing fast" (07/12/2013); multiple joints-not surgical candidate for TKR (03/2015).  Triamcinolon knee injections by Dr. Tessa Lerner 12/2017.  [name] Kitchen Pernicious anemia 08/24/2014  . Pleural plaque with presence of asbestos 07/22/2013  . Pulmonary embolism ([place]) 07/2013   Dx at [facility] with very small peripheral upper lobe pe 07/2013: pt took coumadin for about 8-9 mo  . Pyelonephritis    "several times over the last 30 yr" (07/12/2013)  . RBBB (right bundle branch block)   . Recurrent major depression ([place])   . Recurrent UTI    hx of hospitalization for pyelonephritis; started abx prophylaxis 06/2015  . Syncope    Hypotensive; ED visit--Dr. Terrence Dupont did Cath--nonobstructive CAD, EF 55-60%.  In retrospect, suspect pt rx med misuse/polypharmacy    Past Surgical History:  Procedure Laterality Date  . APPENDECTOMY  1960  . AXILLARY SURGERY Left 1978   Multiple "lump" in armpit per pt  . BIOPSY  06/17/2020   Gastric bx->gastritis, h pylori neg.  Procedure: BIOPSY;  Surgeon: Carol Ada, MD;  Location: [facility] ENDOSCOPY;  Service: Endoscopy;;  . CARDIAC CATHETERIZATION  01/2013   nonobstructive CAD, EF 55-60%  . CARDIOVASCULAR STRESS TEST  02/22/15   Low risk myocard perf imaging; wall motion normal, normal EF  . carotid duplex doppler  10/21/2017   R vertebral flow suggestive of possible distal obstruction.  Pt declined further w/u as of 10/29/17 but need to revisit this problem periodically.  . COCCYX REMOVAL  1972  . DEXA  06/05/2017   T-score -3.1  . DILATION AND CURETTAGE OF UTERUS  ? 1970's  . ESOPHAGOGASTRODUODENOSCOPY N/A 07/19/2014   Gastritis found + in the setting of supratherapeutic INR, +plavix, + meloxicam.  . ESOPHAGOGASTRODUODENOSCOPY (EGD) WITH PROPOFOL N/A 06/17/2020   NO stricture or other prob to explain pt's dysphagia, dilation was done anyway.  Gastric bx's-->gastritis, h pylori neg. Procedure: ESOPHAGOGASTRODUODENOSCOPY (EGD) WITH PROPOFOL;  Surgeon: Carol Ada, MD;  Location: [facility]  ENDOSCOPY;  Service: Endoscopy;  Laterality: N/A;  . EYE SURGERY Left 2012-2013   "injections for ~ 1 yr; don't really know what for" (07/12/2013)  . HEEL SPUR SURGERY Left 2008  . KNEE SURGERY  2005  . LEFT HEART CATHETERIZATION WITH CORONARY ANGIOGRAM N/A 01/30/2013   Procedure: LEFT HEART CATHETERIZATION WITH CORONARY ANGIOGRAM;  Surgeon: Clent Demark, MD;  Location: [facility] CATH LAB;  Service: Cardiovascular;  Laterality: N/A;  . MALONEY DILATION  06/17/2020   Procedure: Venia Minks DILATION;  Surgeon: Carol Ada, MD;  Location: [facility] ENDOSCOPY;  Service: Endoscopy;;  . PLANTAR FASCIA RELEASE Left 2008  . SPIROMETRY  04/25/14   In hosp for acute asthma/COPD flare: mixed obstructive and restrictive lung disease. The FEV1 is severely reduced at 45% predicted.  FEV1 signif decreased compared to prior spirometry 07/23/13.  . TENDON RELEASE  1996   Right forearm and hand  .  TOTAL ABDOMINAL HYSTERECTOMY  1974  . TRANSTHORACIC ECHOCARDIOGRAM  01/2013; 04/2014;08/2015; 09/2017   2014--NORMAL.  2015--focal basal septal hypertrophy, EF 55-60%, grade I diast dysfxn, mild LAE.  08/2015 EF 55-60%, nl LV syst fxn, grade I DD, valves wnl. 10/21/17: EF 65-70%, grd I DD, o/w normal.    Outpatient Medications Prior to Visit  Medication Sig Dispense Refill  . albuterol (VENTOLIN HFA) 108 (90 Base) MCG/ACT inhaler Inhale 1-2 puffs into the lungs every 6 (six) hours as needed for wheezing or shortness of breath. 18 g 1  . alendronate (FOSAMAX) 70 MG tablet Take 1 tablet (70 mg total) by mouth every 7 (seven) days. Take with a full glass of water on an empty stomach, first thing in the morning and remain up right for 30 minutes after taking. 12 tablet 3  . ALPRAZolam (XANAX) 1 MG tablet TAKE 1 TABLET THREE TIMES DAILY AS NEEDED FOR ANXIETY. 90 tablet 1  . aspirin 81 MG tablet Take 81 mg by mouth at bedtime.    . cyclobenzaprine (FLEXERIL) 5 MG tablet Take 1 tablet (5 mg total) by mouth 3 (three) times daily as needed for  muscle spasms. 60 tablet 1  . docusate sodium (COLACE) 100 MG capsule Take 1 capsule (100 mg total) by mouth 2 (two) times daily. 10 capsule 0  . donepezil (ARICEPT) 10 MG tablet Take 1 tablet (10 mg total) by mouth at bedtime. 90 tablet 3  . DULoxetine (CYMBALTA) 30 MG capsule TAKE 1 CAPSULE ONCE DAILY ALONG WITH 60MG  CAPSULE FOR TOTAL 90MG  DAILY. 90 capsule 3  . DULoxetine (CYMBALTA) 60 MG capsule TAKE 1 CAPSULE A DAY TO BE COMBINED WITH 30MG  CAPSULE 90 capsule 3  . EPINEPHrine 0.3 mg/0.3 mL IJ SOAJ injection Inject 0.3 mLs (0.3 mg total) into the muscle as needed for anaphylaxis. 1 each 1  . fluconazole (DIFLUCAN) 100 MG tablet TAKE (1) TABLET DAILY AS NEEDED FOR THRUSH 15 tablet 0  . fluticasone (FLONASE) 50 MCG/ACT nasal spray Place 2 sprays into both nostrils daily. 16 g 11  . furosemide (LASIX) 40 MG tablet Take 1 tablet (40 mg total) by mouth daily as needed for fluid.    [name] Kitchen ipratropium (ATROVENT) 0.03 % nasal spray Place 2 sprays into both nostrils every 12 (twelve) hours. 30 mL 11  . isosorbide mononitrate (IMDUR) 30 MG 24 hr tablet TAKE 2 TABLETS IN THE MORNING AND 1 TABLET IN THE EVENING. 270 tablet 1  . lamoTRIgine (LAMICTAL) 150 MG tablet TAKE 1 TABLET EACH DAY. 30 tablet 3  . metoprolol succinate (TOPROL-XL) 50 MG 24 hr tablet TAKE (1) TABLET DAILY IN THE MORNING. 90 tablet 0  . Multiple Vitamin (MULTIVITAMIN WITH MINERALS) TABS tablet Take 1 tablet by mouth daily.    . nitroGLYCERIN (NITROSTAT) 0.4 MG SL tablet Place 1 tablet (0.4 mg total) under the tongue every 5 (five) minutes as needed for chest pain (x 3 doses). 25 tablet 11  . nystatin (MYCOSTATIN) 100000 UNIT/ML suspension Take 5 mLs (500,000 Units total) by mouth 4 (four) times daily. 60 mL 3  . ondansetron (ZOFRAN) 8 MG tablet Take 1 tablet (8 mg total) by mouth every 8 (eight) hours as needed for nausea or vomiting. 30 tablet 6  . oxyCODONE-acetaminophen (PERCOCET) 10-325 MG tablet Every 6 hours as needed 100 tablet 0  .  pantoprazole (PROTONIX) 40 MG tablet TAKE 1 TABLET BY MOUTH TWICE DAILY. 90 tablet 0  . rosuvastatin (CRESTOR) 20 MG tablet TAKE 1 TABLET ONCE DAILY. [place]  tablet 1  . solifenacin (VESICARE) 10 MG tablet Take 1 tablet (10 mg total) by mouth daily. 30 tablet 6  . traZODone (DESYREL) 50 MG tablet TAKE 2-4 TABLETS AT BEDTIME 360 tablet 3   No facility-administered medications prior to visit.    Allergies  Allergen Reactions  . Abilify [Aripiprazole] Other (See Comments)    parkinsonism  . Penicillins Itching, Swelling and Rash    Tolerated Cefepime in ED. Has patient had a PCN reaction causing immediate rash, facial/tongue/throat swelling, SOB or lightheadedness with hypotension: Yes Has patient had a PCN reaction causing severe rash involving mucus membranes or skin necrosis: No Has patient had a PCN reaction that required hospitalization: No  Has patient had a PCN reaction occurring within the last 10 years: No     ROS As per HPI  PE: Vitals with BMI 09/29/2020 09/26/2020 08/30/2020  Height 5\' 4"  5\' 4"  5\' 4"   Weight 143 lbs 139 lbs 144 lbs  BMI 24.53 01.77 93.90  Systolic 300 - 923  Diastolic 71 - 64  Pulse 76 - 76  O2 sat on RA today is 95%   Gen: Alert, well appearing.  Patient is oriented to person, place, time, and situation. AFFECT: pleasant, lucid thought and speech. RAQ:TMAU: no injection, icteris, swelling, or exudate.  EOMI, PERRLA. Mouth: lips without lesion/swelling.  Oral mucosa pink and moist. Oropharynx without erythema, exudate, or swelling.  TONGUE APPEARS ENTIRELY NORMAL.  No oral plaques. CV: RRR, no m/r/g.   LUNGS: CTA bilat, nonlabored resps, good aeration in all lung fields. EXT: no clubbing or cyanosis.  no edema.  ABD: soft, NT/ND  LABS:  Lab Results  Component Value Date   TSH 2.750 10/04/2019   Lab Results  Component Value Date   WBC 7.8 06/19/2020   HGB 15.5 (H) 06/19/2020   HCT 47.2 (H) 06/19/2020   MCV 88.4 06/19/2020   PLT 176 06/19/2020    Lab Results  Component Value Date   CREATININE 0.74 06/18/2020   BUN 25 (H) 06/18/2020   NA 136 06/18/2020   K 4.2 06/18/2020   CL 101 06/18/2020   CO2 25 06/18/2020   Lab Results  Component Value Date   ALT 26 06/16/2020   AST 25 06/16/2020   ALKPHOS 61 06/16/2020   BILITOT 0.5 06/16/2020   Lab Results  Component Value Date   CHOL 172 09/22/2018   Lab Results  Component Value Date   HDL 43.70 09/22/2018   Lab Results  Component Value Date   LDLCALC 92 09/22/2018   Lab Results  Component Value Date   TRIG 184.0 (H) 09/22/2018   Lab Results  Component Value Date   CHOLHDL 4 09/22/2018   Lab Results  Component Value Date   HGBA1C 5.6 01/02/2016   CC UA today: + nitrites, o/w unremarkable  IMPRESSION AND PLAN:  1) Glossitis: unknown cause.  Reassured pt no infection today. Offered referral to ENT but she declined.  2) HTN: The current medical regimen is effective;  continue present plan and medications. BMET today.  3) Urgency/incontinence: acute on chronic. Usually well controlled with vesicare, so we'll contiunue this. UA with +nitrites.  Will send in cipro 500 mg bid x 3d, and send urine for c/s today.  4) Recurrent MDD/GAD/mild cognitive impairment: all stable. No new rx's needed today. CSC updated.  Flu vaccine given today.  An After Visit Summary was printed and given to the patient.  FOLLOW UP: No follow-ups on file.  Signed:  Abbe Amsterdam  Marda Breidenbach, MD           09/29/2020

## 2020-10-02 ENCOUNTER — Telehealth: Payer: Self-pay

## 2020-10-02 LAB — URINE CULTURE
MICRO NUMBER:: 11049869
SPECIMEN QUALITY:: ADEQUATE

## 2020-10-02 MED ORDER — SULFAMETHOXAZOLE-TRIMETHOPRIM 800-160 MG PO TABS: 1.0000 | ORAL_TABLET | Freq: Two times a day (BID) | ORAL | 0 refills | Status: AC

## 2020-10-02 NOTE — Telephone Encounter (Signed)
Spoke with pt regarding labs and instructions. RX SENT

## 2020-10-02 NOTE — Telephone Encounter (Signed)
-----   Message from Tammi Sou, MD sent at 10/02/2020  8:36 AM EDT ----- Urine grew bacteria that won't be killed by the antibiotic I gave her last week (cipro).  Stop cipro. Pls eRx bactrim DS, 1 bid x 5d, #10, no RF.

## 2020-10-20 ENCOUNTER — Encounter: Payer: Self-pay | Admitting: Family Medicine

## 2020-10-20 ENCOUNTER — Ambulatory Visit (INDEPENDENT_AMBULATORY_CARE_PROVIDER_SITE_OTHER): Payer: Medicare Other | Admitting: Family Medicine

## 2020-10-20 VITALS — BP 132/80 | HR 74 | Temp 98.3°F | Resp 16 | Wt 143.0 lb

## 2020-10-20 DIAGNOSIS — L84 Corns and callosities: Secondary | ICD-10-CM

## 2020-10-20 MED ORDER — MUPIROCIN 2 % EX OINT: 1.0000 "application " | TOPICAL_OINTMENT | Freq: Two times a day (BID) | CUTANEOUS | 0 refills | Status: DC

## 2020-10-20 MED ORDER — DOXYCYCLINE HYCLATE 100 MG PO TABS: 100.0000 mg | ORAL_TABLET | Freq: Two times a day (BID) | ORAL | 0 refills | Status: DC

## 2020-10-20 NOTE — Patient Instructions (Addendum)
Soak in Epson salt and warm water.  Take doxycyline every 12 hours for 5 days Keep Bactroban cream/ointment over area.  Keep pressure off area.  Podiatry will call you  To schedule   Corns and Calluses Corns are small areas of thickened skin that occur on the top, sides, or tip of a toe. They contain a cone-shaped core with a point that can press on a nerve below. This causes pain.  Calluses are areas of thickened skin that can occur anywhere on the body, including the hands, fingers, palms, soles of the feet, and heels. Calluses are usually larger than corns. What are the causes? Corns and calluses are caused by rubbing (friction) or pressure, such as from shoes that are too tight or do not fit properly. What increases the risk? Corns are more likely to develop in people who have misshapen toes (toe deformities), such as hammer toes. Calluses can occur with friction to any area of the skin. They are more likely to develop in people who:  Work with their hands.  Wear shoes that fit poorly, are too tight, or are high-heeled.  Have toe deformities. What are the signs or symptoms? Symptoms of a corn or callus include:  A hard growth on the skin.  Pain or tenderness under the skin.  Redness and swelling.  Increased discomfort while wearing tight-fitting shoes, if your feet are affected. If a corn or callus becomes infected, symptoms may include:  Redness and swelling that gets worse.  Pain.  Fluid, blood, or pus draining from the corn or callus. How is this diagnosed? Corns and calluses may be diagnosed based on your symptoms, your medical history, and a physical exam. How is this treated? Treatment for corns and calluses may include:  Removing the cause of the friction or pressure. This may involve: ? Changing your shoes. ? Wearing shoe inserts (orthotics) or other protective layers in your shoes, such as a corn pad. ? Wearing gloves.  Applying medicine to the skin  (topical medicine) to help soften skin in the hardened, thickened areas.  Removing layers of dead skin with a file to reduce the size of the corn or callus.  Removing the corn or callus with a scalpel or laser.  Taking antibiotic medicines, if your corn or callus is infected.  Having surgery, if a toe deformity is the cause. Follow these instructions at home:   Take over-the-counter and prescription medicines only as told by your health care provider.  If you were prescribed an antibiotic, take it as told by your health care provider. Do not stop taking it even if your condition starts to improve.  Wear shoes that fit well. Avoid wearing high-heeled shoes and shoes that are too tight or too loose.  Wear any padding, protective layers, gloves, or orthotics as told by your health care provider.  Soak your hands or feet and then use a file or pumice stone to soften your corn or callus. Do this as told by your health care provider.  Check your corn or callus every day for symptoms of infection. Contact a health care provider if you:  Notice that your symptoms do not improve with treatment.  Have redness or swelling that gets worse.  Notice that your corn or callus becomes painful.  Have fluid, blood, or pus coming from your corn or callus.  Have new symptoms. Summary  Corns are small areas of thickened skin that occur on the top, sides, or tip of a toe.  Calluses are areas of thickened skin that can occur anywhere on the body, including the hands, fingers, palms, and soles of the feet. Calluses are usually larger than corns.  Corns and calluses are caused by rubbing (friction) or pressure, such as from shoes that are too tight or do not fit properly.  Treatment may include wearing any padding, protective layers, gloves, or orthotics as told by your health care provider. This information is not intended to replace advice given to you by your health care provider. Make sure you  discuss any questions you have with your health care provider. Document Revised: 03/31/2019 Document Reviewed: 10/22/2017 Elsevier Patient Education  2020 [employer].

## 2020-10-20 NOTE — Progress Notes (Signed)
This visit occurred during the SARS-CoV-2 public health emergency.  Safety protocols were in place, including screening questions prior to the visit, additional usage of staff PPE, and extensive cleaning of exam room while observing appropriate contact time as indicated for disinfecting solutions.    Linda [name] , May 21, 1946, 74 y.o., female MRN: [id number] Patient Care Team    Relationship Specialty Notifications Start End  McGowen, Adrian Blackwater, MD PCP - General Family Medicine  01/10/20   Volanda Napoleon, MD Consulting Physician Oncology  07/10/13   Tanda Rockers, MD Consulting Physician Pulmonary Disease  08/24/13   Gavin Pound, MD Consulting Physician Rheumatology  05/04/14   Meredith Staggers, MD Consulting Physician Physical Medicine and Rehabilitation  01/16/15    Comment: Pain mgmt MD for Mrs. Tonny Branch, Irven Easterly, MD Consulting Physician Rheumatology  04/13/15    Comment: as of 03/2015 Dr. Amil Amen is seeing her only prn  Stanford Breed Denice Bors, MD Consulting Physician Cardiology  08/21/15   Gaynelle Arabian, MD Consulting Physician Orthopedic Surgery  08/08/16   Troy Sine, MD Consulting Physician Cardiology  09/23/16   Tat, Eustace Quail, DO Consulting Physician Neurology  11/24/17   Bobbitt, Sedalia Muta, MD Consulting Physician Allergy and Immunology  02/07/20   Deloria Lair, [facility]  Admissions 06/23/20     Chief Complaint  Patient presents with  . sore between toes that she fillls has gone to the bone     Subjective: Pt presents for an OV with complaints of a sore between her right fourth and fifth toe and has been present for a few weeks and is becoming worse.  She states it is hurts to put on shoes or place pressure over area.  Reports there is some mild redness surrounding area.  No swelling or drainage appreciated.  She denies fever, chills, nausea.  She is tolerating p.o.  She is prescribed Percocet through her pain management.  She  is not a diabetic.  Depression screen [facility] 2/9 09/29/2020 07/24/2020 04/26/2020 09/27/2019 02/24/2019  Decreased Interest 0 1 1 1 1   Down, Depressed, Hopeless 0 1 1 2 1   PHQ - 2 Score 0 2 2 3 2   Altered sleeping 1 - - 1 -  Tired, decreased energy 3 - - 1 -  Change in appetite 3 - - 0 -  Feeling bad or failure about yourself  0 - - 0 -  Trouble concentrating 3 - - 1 -  Moving slowly or fidgety/restless 0 - - 0 -  Suicidal thoughts 0 - - 0 -  PHQ-9 Score 10 - - 6 -  Difficult doing work/chores - - - Not difficult at all -  Some recent data might be hidden    Allergies  Allergen Reactions  . Abilify [Aripiprazole] Other (See Comments)    parkinsonism  . Penicillins Itching, Swelling and Rash    Tolerated Cefepime in ED. Has patient had a PCN reaction causing immediate rash, facial/tongue/throat swelling, SOB or lightheadedness with hypotension: Yes Has patient had a PCN reaction causing severe rash involving mucus membranes or skin necrosis: No Has patient had a PCN reaction that required hospitalization: No  Has patient had a PCN reaction occurring within the last 10 years: No    Social History   Social History Narrative   Widowed, 2 sons.  Relocated to [place] 09/2012 to be closer to her son who has MS.   Husband d 2015--mesothelioma.  Occupation: former Pharmacist, hospital.   Education: masters degree level.   No T/A/Ds.   Lives in [place], independent living.   Past Medical History:  Diagnosis Date  . Acute upper GI bleed 06/2014   while pt taking coumadin, plavix, and meloxicam---despite being told not to take coumadin.  . Anginal pain ([place])    Nonobstructive CAD 2014; however, her cardiologist put her on a statin for this and NOT for hyperlipidemia per pt report.  Atyp CP 08/2017 at card f/u, plan for myoc perf imaging.  . Anxiety    panic attacks  . Asthma    w/ asbestososis   . BPPV (benign paroxysmal positional vertigo) 12/16/2012  . Chronic diastolic CHF (congestive heart  failure) (HCC)    dry wt as of 11/06/16 is 168 lbs.  . Chronic lower back pain   . COPD (chronic obstructive pulmonary disease) ([place])   . DDD (degenerative disc disease)    lumbar and cervical.   . Diverticular disease   . Fibromyalgia    Patient states dx was around her late 31s but she had sx's for years prior to this.  . H/O hiatal hernia   . History of pneumonia    hospitalized 12/2011, 02/2013, and 07/2013 [facility]) for this  . HTN (hypertension)    Renal artery dopplers 04/2013 neg for stenosis.  . Hypervitaminosis D 09/27/2019   over-supplemented.  Stopped vit D and plan recheck 2 mo.  . Idiopathic angio-edema-urticaria [id number]; 2021   Angioedema component was very minimal.  2021->Dr. Bobbitt (allergist) eval.  . Insomnia   . Iron deficiency anemia    Hematologist in [place], [place] did extensive w/u; no cause found; failed oral supplement;; gets fairly regular (q58m or so) IV iron infusions (Venofer -iron sucrose- 200mg  with procrit.  "for 14 yr I've been getting blood work q month & getting infusions prn" (07/12/2013).  Dr. Marin Olp locally, iron infusions done, EPO deficiency dx'd  . Migraine syndrome    "not as often anymore; used to be ~ q wk" (07/12/2013)  . Mixed incontinence urge and stress   . Nephrolithiasis    "passed all on my own or they are still in there" (07/12/2013)  . Neuroleptic induced parkinsonism ([place]) 2018   Dr. Carles Collet, neuro, saw her 11/24/17 and recommended d/c of abilify as first step.  D/c'd abilify and pt got complete recovery.  . Oropharyngeal dysphagia    swallowing study speech path 05/2020. Gastric bx's showed gastritis, h pylori NEG  . OSA on CPAP    prior to move to [place]--had another sleep study 10/2015 w/pulm Dr. Camillo Flaming.  . Osteoarthritis    "severe; progressing fast" (07/12/2013); multiple joints-not surgical candidate for TKR (03/2015).  Triamcinolon knee injections by Dr. Tessa Lerner 12/2017.  [name] Kitchen Pernicious anemia 08/24/2014  . Pleural plaque with presence of asbestos  07/22/2013  . Pulmonary embolism ([place]) 07/2013   Dx at [facility] with very small peripheral upper lobe pe 07/2013: pt took coumadin for about 8-9 mo  . Pyelonephritis    "several times over the last 30 yr" (07/12/2013)  . RBBB (right bundle branch block)   . Recurrent major depression ([place])   . Recurrent UTI    hx of hospitalization for pyelonephritis; started abx prophylaxis 06/2015  . Syncope    Hypotensive; ED visit--Dr. Terrence Dupont did Cath--nonobstructive CAD, EF 55-60%.  In retrospect, suspect pt rx med misuse/polypharmacy   Past Surgical History:  Procedure Laterality Date  . APPENDECTOMY  1960  . AXILLARY SURGERY Left 1978  Multiple "lump" in armpit per pt  . BIOPSY  06/17/2020   Gastric bx->gastritis, h pylori neg.  Procedure: BIOPSY;  Surgeon: Carol Ada, MD;  Location: [facility] ENDOSCOPY;  Service: Endoscopy;;  . CARDIAC CATHETERIZATION  01/2013   nonobstructive CAD, EF 55-60%  . CARDIOVASCULAR STRESS TEST  02/22/15   Low risk myocard perf imaging; wall motion normal, normal EF  . carotid duplex doppler  10/21/2017   R vertebral flow suggestive of possible distal obstruction.  Pt declined further w/u as of 10/29/17 but need to revisit this problem periodically.  . COCCYX REMOVAL  1972  . DEXA  06/05/2017   T-score -3.1  . DILATION AND CURETTAGE OF UTERUS  ? 1970's  . ESOPHAGOGASTRODUODENOSCOPY N/A 07/19/2014   Gastritis found + in the setting of supratherapeutic INR, +plavix, + meloxicam.  . ESOPHAGOGASTRODUODENOSCOPY (EGD) WITH PROPOFOL N/A 06/17/2020   NO stricture or other prob to explain pt's dysphagia, dilation was done anyway.  Gastric bx's-->gastritis, h pylori neg. Procedure: ESOPHAGOGASTRODUODENOSCOPY (EGD) WITH PROPOFOL;  Surgeon: Carol Ada, MD;  Location: [facility];  Service: Endoscopy;  Laterality: N/A;  . EYE SURGERY Left 2012-2013   "injections for ~ 1 yr; don't really know what for" (07/12/2013)  . HEEL SPUR SURGERY Left 2008  . KNEE SURGERY  2005  . LEFT HEART  CATHETERIZATION WITH CORONARY ANGIOGRAM N/A 01/30/2013   Procedure: LEFT HEART CATHETERIZATION WITH CORONARY ANGIOGRAM;  Surgeon: Clent Demark, MD;  Location: [facility] CATH LAB;  Service: Cardiovascular;  Laterality: N/A;  . MALONEY DILATION  06/17/2020   Procedure: Venia Minks DILATION;  Surgeon: Carol Ada, MD;  Location: [facility] END[facility];  Service: Endoscopy;;  . PLANTAR FASCIA RELEASE Left 2008  . SPIROMETRY  04/25/14   In hosp for acute asthma/COPD flare: mixed obstructive and restrictive lung disease. The FEV1 is severely reduced at 45% predicted.  FEV1 signif decreased compared to prior spirometry 07/23/13.  . TENDON RELEASE  1996   Right forearm and hand  . TOTAL ABDOMINAL HYSTERECTOMY  1974  . TRANSTHORACIC ECHOCARDIOGRAM  01/2013; 04/2014;08/2015; 09/2017   2014--NORMAL.  2015--focal basal septal hypertrophy, EF 55-60%, grade I diast dysfxn, mild LAE.  08/2015 EF 55-60%, nl LV syst fxn, grade I DD, valves wnl. 10/21/17: EF 65-70%, grd I DD, o/w normal.   Family History  Problem Relation Age of Onset  . Arthritis Mother   . Kidney disease Mother   . Heart disease Father   . Stroke Father   . Hypertension Father   . Diabetes Father   . Heart attack Father   . Heart attack Paternal Grandmother   . Diabetes Sister        one sister  . Hypertension Sister   . Asthma Sister   . Hypertension Brother   . Asthma Brother   . Asthma Daughter   . Multiple sclerosis Son    Allergies as of 10/20/2020      Reactions   Abilify [aripiprazole] Other (See Comments)   parkinsonism   Penicillins Itching, Swelling, Rash   Tolerated Cefepime in ED. Has patient had a PCN reaction causing immediate rash, facial/tongue/throat swelling, SOB or lightheadedness with hypotension: Yes Has patient had a PCN reaction causing severe rash involving mucus membranes or skin necrosis: No Has patient had a PCN reaction that required hospitalization: No  Has patient had a PCN reaction occurring within the last 10 years: No        Medication List       Accurate as of October 20, 2020 12:37  PM. If you have any questions, ask your nurse or doctor.        albuterol 108 (90 Base) MCG/ACT inhaler Commonly known as: VENTOLIN HFA Inhale 1-2 puffs into the lungs every 6 (six) hours as needed for wheezing or shortness of breath.   alendronate 70 MG tablet Commonly known as: FOSAMAX Take 1 tablet (70 mg total) by mouth every 7 (seven) days. Take with a full glass of water on an empty stomach, first thing in the morning and remain up right for 30 minutes after taking.   ALPRAZolam 1 MG tablet Commonly known as: XANAX TAKE 1 TABLET THREE TIMES DAILY AS NEEDED FOR ANXIETY.   aspirin 81 MG tablet Take 81 mg by mouth at bedtime.   cyclobenzaprine 5 MG tablet Commonly known as: FLEXERIL Take 1 tablet (5 mg total) by mouth 3 (three) times daily as needed for muscle spasms.   docusate sodium 100 MG capsule Commonly known as: COLACE Take 1 capsule (100 mg total) by mouth 2 (two) times daily.   donepezil 10 MG tablet Commonly known as: Aricept Take 1 tablet (10 mg total) by mouth at bedtime.   doxycycline 100 MG tablet Commonly known as: VIBRA-TABS Take 1 tablet (100 mg total) by mouth 2 (two) times daily. Started by: Howard Pouch, DO   DULoxetine 30 MG capsule Commonly known as: CYMBALTA TAKE 1 CAPSULE ONCE DAILY ALONG WITH 60MG  CAPSULE FOR TOTAL 90MG  DAILY.   DULoxetine 60 MG capsule Commonly known as: CYMBALTA TAKE 1 CAPSULE A DAY TO BE COMBINED WITH 30MG  CAPSULE   EPINEPHrine 0.3 mg/0.3 mL Soaj injection Commonly known as: EPI-PEN Inject 0.3 mLs (0.3 mg total) into the muscle as needed for anaphylaxis.   fluconazole 100 MG tablet Commonly known as: DIFLUCAN TAKE (1) TABLET DAILY AS NEEDED FOR THRUSH   fluticasone 50 MCG/ACT nasal spray Commonly known as: FLONASE Place 2 sprays into both nostrils daily.   furosemide 40 MG tablet Commonly known as: LASIX Take 1 tablet (40 mg total) by mouth  daily as needed for fluid.   ipratropium 0.03 % nasal spray Commonly known as: ATROVENT Place 2 sprays into both nostrils every 12 (twelve) hours.   isosorbide mononitrate 30 MG 24 hr tablet Commonly known as: IMDUR TAKE 2 TABLETS IN THE MORNING AND 1 TABLET IN THE EVENING.   lamoTRIgine 150 MG tablet Commonly known as: LAMICTAL TAKE 1 TABLET EACH DAY.   metoprolol succinate 50 MG 24 hr tablet Commonly known as: TOPROL-XL TAKE (1) TABLET DAILY IN THE MORNING.   multivitamin with minerals Tabs tablet Take 1 tablet by mouth daily.   mupirocin ointment 2 % Commonly known as: BACTROBAN Apply 1 application topically 2 (two) times daily. Can be ointment or cream- whichever on formulary and cheapest for her Started by: Howard Pouch, DO   nitroGLYCERIN 0.4 MG SL tablet Commonly known as: NITROSTAT Place 1 tablet (0.4 mg total) under the tongue every 5 (five) minutes as needed for chest pain (x 3 doses).   nystatin 100000 UNIT/ML suspension Commonly known as: MYCOSTATIN Take 5 mLs (500,000 Units total) by mouth 4 (four) times daily.   ondansetron 8 MG tablet Commonly known as: Zofran Take 1 tablet (8 mg total) by mouth every 8 (eight) hours as needed for nausea or vomiting.   oxyCODONE-acetaminophen 10-325 MG tablet Commonly known as: PERCOCET Every 6 hours as needed   pantoprazole 40 MG tablet Commonly known as: PROTONIX TAKE 1 TABLET BY MOUTH TWICE DAILY.   rosuvastatin 20  MG tablet Commonly known as: CRESTOR TAKE 1 TABLET ONCE DAILY.   solifenacin 10 MG tablet Commonly known as: VESIcare Take 1 tablet (10 mg total) by mouth daily.   traZODone 50 MG tablet Commonly known as: DESYREL TAKE 2-4 TABLETS AT BEDTIME       All past medical history, surgical history, allergies, family history, immunizations andmedications were updated in the EMR today and reviewed under the history and medication portions of their EMR.     ROS: Negative, with the exception of above  mentioned in HPI   Objective:  BP 132/80 (BP Location: Right Arm, Patient Position: Sitting, Cuff Size: Normal)   Pulse 74   Temp 98.3 F (36.8 C) (Oral)   Resp 16   Wt 143 lb (64.9 kg)   SpO2 94%   BMI 24.55 kg/m  Body mass index is 24.55 kg/m. Gen: Afebrile. No acute distress. Nontoxic in appearance, well developed, well nourished.  Well-appearing female HENT: AT. [place].  Eyes:Pupils Equal Round Reactive to light, Extraocular movements intact,  Conjunctiva without redness, discharge or icterus. Skin/right foot: Right fifth toe with very mild erythema laterally and medial aspect of toe.   ~ 5 mm Callus/corn formation present medial aspect of fifth toe with moderate tenderness to palpation.  Full range of motion. Neuro: Mild limp present. PERLA. EOMi. Alert. Oriented x3   No exam data present No results found. No results found. However, due to the size of the patient record, not all encounters were searched. Please check Results Review for a complete set of results.  Assessment/Plan: GABRIELLAH RABEL is a 74 y.o. female present for OV for  Callus/corn Patient appears well today.  No red flags on exam.  Mild redness surrounding corn/callus formation right medial fifth toe.  She is walking with a mild limp today and is tender over this area.  Cannot rule out infection beneath callus.  Will treat with 5-day course of doxycycline twice daily prophylactically along with Bactroban ointment twice daily.  Patient was encouraged to soak area in warm/Epson salt soaks twice daily and then apply the Bactroban thereafter. - Ambulatory referral to Podiatry for further evaluation and treatment.   Reviewed expectations re: course of current medical issues.  Discussed self-management of symptoms.  Outlined signs and symptoms indicating need for more acute intervention.  Patient verbalized understanding and all questions were answered.  Patient received an After-Visit Summary.    Orders  Placed This Encounter  Procedures  . Ambulatory referral to [facility] ordered this encounter  Medications  . doxycycline (VIBRA-TABS) 100 MG tablet    Sig: Take 1 tablet (100 mg total) by mouth 2 (two) times daily.    Dispense:  10 tablet    Refill:  0  . mupirocin ointment (BACTROBAN) 2 %    Sig: Apply 1 application topically 2 (two) times daily. Can be ointment or cream- whichever on formulary and cheapest for her    Dispense:  22 g    Refill:  0    Referral Orders     Ambulatory referral to Podiatry   Note is dictated utilizing voice recognition software. Although note has been proof read prior to signing, occasional typographical errors still can be missed. If any questions arise, please do not hesitate to call for verification.   electronically signed by:  Howard Pouch, DO  [place]

## 2020-10-23 ENCOUNTER — Telehealth: Payer: Self-pay

## 2020-10-23 ENCOUNTER — Other Ambulatory Visit: Payer: Self-pay | Admitting: Family Medicine

## 2020-10-23 ENCOUNTER — Other Ambulatory Visit: Payer: Self-pay | Admitting: Physical Medicine & Rehabilitation

## 2020-10-23 DIAGNOSIS — M797 Fibromyalgia: Secondary | ICD-10-CM

## 2020-10-23 DIAGNOSIS — M1712 Unilateral primary osteoarthritis, left knee: Secondary | ICD-10-CM

## 2020-10-23 MED ORDER — OXYCODONE-ACETAMINOPHEN 10-325 MG PO TABS: ORAL_TABLET | ORAL | 0 refills | Status: DC

## 2020-10-23 NOTE — Telephone Encounter (Signed)
PMP was Reviewed: Oxycodone e-scribed today. Placed a call to Ms. Magnone regarding the above.

## 2020-10-23 NOTE — Addendum Note (Signed)
Addended by: Bayard Hugger on: 10/23/2020 11:17 AM   Modules accepted: Orders

## 2020-10-23 NOTE — Telephone Encounter (Signed)
Today Linda [name] will be out of her Oxycodone 10/325 MG. She would like you to send in her refill. Per patient the isssue is because had gaps in her appointments & the 31 days in October.   Thank you.   PMP: Filled on 09/23/2020 Written on 08/30/2020   Oxycodone-Acetaminophen 10-325

## 2020-11-01 ENCOUNTER — Other Ambulatory Visit: Payer: Self-pay

## 2020-11-01 ENCOUNTER — Encounter: Payer: Medicare Other | Attending: Physical Medicine & Rehabilitation | Admitting: Physical Medicine & Rehabilitation

## 2020-11-01 ENCOUNTER — Encounter: Payer: Self-pay | Admitting: Physical Medicine & Rehabilitation

## 2020-11-01 DIAGNOSIS — M797 Fibromyalgia: Secondary | ICD-10-CM | POA: Diagnosis not present

## 2020-11-01 DIAGNOSIS — I2510 Atherosclerotic heart disease of native coronary artery without angina pectoris: Secondary | ICD-10-CM

## 2020-11-01 DIAGNOSIS — M1712 Unilateral primary osteoarthritis, left knee: Secondary | ICD-10-CM | POA: Diagnosis not present

## 2020-11-01 MED ORDER — OXYCODONE-ACETAMINOPHEN 10-325 MG PO TABS: ORAL_TABLET | ORAL | 0 refills | Status: DC

## 2020-11-01 NOTE — Patient Instructions (Addendum)
PLEASE FEEL FREE TO CALL OUR OFFICE WITH ANY PROBLEMS OR QUESTIONS ([phone])     REMEMBER TO USE MOIST HEAT, MASSAGE, MAINTAIN ACTIVITY. IF IT DOESN'T EASE UP OVER THE NEXT COUPLE WEEKS, GIVE ME A CALL.

## 2020-11-01 NOTE — Progress Notes (Signed)
Subjective:    Patient ID: Linda [name], female    DOB: [birth year], 74 y.o.   MRN: [id number]  HPI   Linda [name] is here in follow up of her chronic pain. At last visit we injected her knees with zilretta which helped her knee pain. The right knee feels better than the left knee.  She developed an "infection between her toes on the right foot since I last saw her.   She does reports pain in her left thigh over the last few weeks. She's not sure as to how it started. She thinks it started before she had problems with the toes.  The thigh bothers her more when she swings her leg then with weightbearing itself.    Pain Inventory Average Pain 4 Pain Right Now 5 My pain is constant, sharp and stabbing  In the last 24 hours, has pain interfered with the following? General activity 8 Relation with others 8 Enjoyment of life 8 What TIME of day is your pain at its worst? morning , daytime and evening Sleep (in general) Fair  Pain is worse with: walking, standing and some activites Pain improves with: rest, medication and injections Relief from Meds: 5  Family History  Problem Relation Age of Onset  . Arthritis Mother   . Kidney disease Mother   . Heart disease Father   . Stroke Father   . Hypertension Father   . Diabetes Father   . Heart attack Father   . Heart attack Paternal Grandmother   . Diabetes Sister        one sister  . Hypertension Sister   . Asthma Sister   . Hypertension Brother   . Asthma Brother   . Asthma Daughter   . Multiple sclerosis Son    Social History   Socioeconomic History  . Marital status: Widowed    Spouse name: Not on file  . Number of children: 2  . Years of education: Not on file  . Highest education level: Not on file  Occupational History  . 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 Smoking status: Never Smoker  . Smokeless tobacco: Never Used  . Tobacco comment: never used tobacco  Vaping Use  .  Vaping Use: Never used  Substance and Sexual Activity  . Alcohol use: No    Alcohol/week: 0.0 standard drinks  . Drug use: No  . Sexual activity: Not Currently  Other Topics Concern  . Not on file  Social History Narrative   Widowed, 2 sons.  Relocated to [place] 09/2012 to be closer to her son who has MS.   Husband d 2015--mesothelioma.   Occupation: former Pharmacist, hospital.   Education: masters degree level.   No T/A/Ds.   Lives in [place], independent living.   Social Determinants of Health   Financial Resource Strain:   . Difficulty of Paying Living Expenses: Not on file  Food Insecurity:   . Worried About Charity fundraiser in the Last Year: Not on file  . Ran Out of Food in the Last Year: Not on file  Transportation Needs:   . Lack of Transportation (Medical): Not on file  . Lack of Transportation (Non-Medical): Not on file  Physical Activity:   . Days of Exercise per Week: Not on file  . Minutes of Exercise per Session: Not on file  Stress:   . Feeling of Stress : Not on file  Social Connections:   .  Frequency of Communication with Friends and Family: Not on file  . Frequency of Social Gatherings with Friends and Family: Not on file  . Attends Religious Services: Not on file  . Active Member of Clubs or Organizations: Not on file  . Attends Archivist Meetings: Not on file  . Marital Status: Not on file   Past Surgical History:  Procedure Laterality Date  . APPENDECTOMY  1960  . AXILLARY SURGERY Left 1978   Multiple "lump" in armpit per pt  . BIOPSY  06/17/2020   Gastric bx->gastritis, h pylori neg.  Procedure: BIOPSY;  Surgeon: Carol Ada, MD;  Location: [facility];  Service: Endoscopy;;  . CARDIAC CATHETERIZATION  01/2013   nonobstructive CAD, EF 55-60%  . CARDIOVASCULAR STRESS TEST  02/22/15   Low risk myocard perf imaging; wall motion normal, normal EF  . carotid duplex doppler  10/21/2017   R vertebral flow suggestive of possible distal obstruction.   Pt declined further w/u as of 10/29/17 but need to revisit this problem periodically.  . COCCYX REMOVAL  1972  . DEXA  06/05/2017   T-score -3.1  . DILATION AND CURETTAGE OF UTERUS  ? 1970's  . ESOPHAGOGASTRODUODENOSCOPY N/A 07/19/2014   Gastritis found + in the setting of supratherapeutic INR, +plavix, + meloxicam.  . ESOPHAGOGASTRODUODENOSCOPY (EGD) WITH PROPOFOL N/A 06/17/2020   NO stricture or other prob to explain pt's dysphagia, dilation was done anyway.  Gastric bx's-->gastritis, h pylori neg. Procedure: ESOPHAGOGASTRODUODENOSCOPY (EGD) WITH PROPOFOL;  Surgeon: Carol Ada, MD;  Location: [facility];  Service: Endoscopy;  Laterality: N/A;  . EYE SURGERY Left 2012-2013   "injections for ~ 1 yr; don't really know what for" (07/12/2013)  . HEEL SPUR SURGERY Left 2008  . KNEE SURGERY  2005  . LEFT HEART CATHETERIZATION WITH CORONARY ANGIOGRAM N/A 01/30/2013   Procedure: LEFT HEART CATHETERIZATION WITH CORONARY ANGIOGRAM;  Surgeon: Clent Demark, MD;  Location: [facility] CATH LAB;  Service: Cardiovascular;  Laterality: N/A;  . MALONEY DILATION  06/17/2020   Procedure: Venia Minks DILATION;  Surgeon: Carol Ada, MD;  Location: [facility];  Service: Endoscopy;;  . PLANTAR FASCIA RELEASE Left 2008  . SPIROMETRY  04/25/14   In hosp for acute asthma/COPD flare: mixed obstructive and restrictive lung disease. The FEV1 is severely reduced at 45% predicted.  FEV1 signif decreased compared to prior spirometry 07/23/13.  . TENDON RELEASE  1996   Right forearm and hand  . TOTAL ABDOMINAL HYSTERECTOMY  1974  . TRANSTHORACIC ECHOCARDIOGRAM  01/2013; 04/2014;08/2015; 09/2017   2014--NORMAL.  2015--focal basal septal hypertrophy, EF 55-60%, grade I diast dysfxn, mild LAE.  08/2015 EF 55-60%, nl LV syst fxn, grade I DD, valves wnl. 10/21/17: EF 65-70%, grd I DD, o/w normal.   Past Surgical History:  Procedure Laterality Date  . APPENDECTOMY  1960  . AXILLARY SURGERY Left 1978   Multiple "lump" in armpit per pt  .  BIOPSY  06/17/2020   Gastric bx->gastritis, h pylori neg.  Procedure: BIOPSY;  Surgeon: Carol Ada, MD;  Location: [facility];  Service: Endoscopy;;  . CARDIAC CATHETERIZATION  01/2013   nonobstructive CAD, EF 55-60%  . CARDIOVASCULAR STRESS TEST  02/22/15   Low risk myocard perf imaging; wall motion normal, normal EF  . carotid duplex doppler  10/21/2017   R vertebral flow suggestive of possible distal obstruction.  Pt declined further w/u as of 10/29/17 but need to revisit this problem periodically.  . COCCYX REMOVAL  1972  . DEXA  06/05/2017  T-score -3.1  . DILATION AND CURETTAGE OF UTERUS  ? 1970's  . ESOPHAGOGASTRODUODENOSCOPY N/A 07/19/2014   Gastritis found + in the setting of supratherapeutic INR, +plavix, + meloxicam.  . ESOPHAGOGASTRODUODENOSCOPY (EGD) WITH PROPOFOL N/A 06/17/2020   NO stricture or other prob to explain pt's dysphagia, dilation was done anyway.  Gastric bx's-->gastritis, h pylori neg. Procedure: ESOPHAGOGASTRODUODENOSCOPY (EGD) WITH PROPOFOL;  Surgeon: Carol Ada, MD;  Location: [facility];  Service: Endoscopy;  Laterality: N/A;  . EYE SURGERY Left 2012-2013   "injections for ~ 1 yr; don't really know what for" (07/12/2013)  . HEEL SPUR SURGERY Left 2008  . KNEE SURGERY  2005  . LEFT HEART CATHETERIZATION WITH CORONARY ANGIOGRAM N/A 01/30/2013   Procedure: LEFT HEART CATHETERIZATION WITH CORONARY ANGIOGRAM;  Surgeon: Clent Demark, MD;  Location: [facility] CATH LAB;  Service: Cardiovascular;  Laterality: N/A;  . MALONEY DILATION  06/17/2020   Procedure: Venia Minks DILATION;  Surgeon: Carol Ada, MD;  Location: [facility];  Service: Endoscopy;;  . PLANTAR FASCIA RELEASE Left 2008  . SPIROMETRY  04/25/14   In hosp for acute asthma/COPD flare: mixed obstructive and restrictive lung disease. The FEV1 is severely reduced at 45% predicted.  FEV1 signif decreased compared to prior spirometry 07/23/13.  . TENDON RELEASE  1996   Right forearm and hand  . TOTAL ABDOMINAL  HYSTERECTOMY  1974  . TRANSTHORACIC ECHOCARDIOGRAM  01/2013; 04/2014;08/2015; 09/2017   2014--NORMAL.  2015--focal basal septal hypertrophy, EF 55-60%, grade I diast dysfxn, mild LAE.  08/2015 EF 55-60%, nl LV syst fxn, grade I DD, valves wnl. 10/21/17: EF 65-70%, grd I DD, o/w normal.   Past Medical History:  Diagnosis Date  . Acute upper GI bleed 06/2014   while pt taking coumadin, plavix, and meloxicam---despite being told not to take coumadin.  . Anginal pain ([place])    Nonobstructive CAD 2014; however, her cardiologist put her on a statin for this and NOT for hyperlipidemia per pt report.  Atyp CP 08/2017 at card f/u, plan for myoc perf imaging.  . Anxiety    panic attacks  . Asthma    w/ asbestososis   . BPPV (benign paroxysmal positional vertigo) 12/16/2012  . Chronic diastolic CHF (congestive heart failure) (HCC)    dry wt as of 11/06/16 is 168 lbs.  . Chronic lower back pain   . COPD (chronic obstructive pulmonary disease) ([place])   . DDD (degenerative disc disease)    lumbar and cervical.   . Diverticular disease   . Fibromyalgia    Patient states dx was around her late 50s but she had sx's for years prior to this.  . H/O hiatal hernia   . History of pneumonia    hospitalized 12/2011, 02/2013, and 07/2013 [facility]) for this  . HTN (hypertension)    Renal artery dopplers 04/2013 neg for stenosis.  . Hypervitaminosis D 09/27/2019   over-supplemented.  Stopped vit D and plan recheck 2 mo.  . Idiopathic angio-edema-urticaria [id number]; 2021   Angioedema component was very minimal.  2021->Dr. Bobbitt (allergist) eval.  . Insomnia   . Iron deficiency anemia    Hematologist in [place], [place] did extensive w/u; no cause found; failed oral supplement;; gets fairly regular (q26m or so) IV iron infusions (Venofer -iron sucrose- 200mg  with procrit.  "for 14 yr I've been getting blood work q month & getting infusions prn" (07/12/2013).  Dr. Marin Olp locally, iron infusions done, EPO deficiency dx'd  .  Migraine syndrome    "not as often anymore;  used to be ~ q wk" (07/12/2013)  . Mixed incontinence urge and stress   . Nephrolithiasis    "passed all on my own or they are still in there" (07/12/2013)  . Neuroleptic induced parkinsonism ([place]) 2018   Dr. Carles Collet, neuro, saw her 11/24/17 and recommended d/c of abilify as first step.  D/c'd abilify and pt got complete recovery.  . Oropharyngeal dysphagia    swallowing study speech path 05/2020. Gastric bx's showed gastritis, h pylori NEG  . OSA on CPAP    prior to move to [place]--had another sleep study 10/2015 w/pulm Dr. Camillo Flaming.  . Osteoarthritis    "severe; progressing fast" (07/12/2013); multiple joints-not surgical candidate for TKR (03/2015).  Triamcinolon knee injections by Dr. Tessa Lerner 12/2017.  [name] Kitchen Pernicious anemia 08/24/2014  . Pleural plaque with presence of asbestos 07/22/2013  . Pulmonary embolism ([place]) 07/2013   Dx at [facility] with very small peripheral upper lobe pe 07/2013: pt took coumadin for about 8-9 mo  . Pyelonephritis    "several times over the last 30 yr" (07/12/2013)  . RBBB (right bundle branch block)   . Recurrent major depression ([place])   . Recurrent UTI    hx of hospitalization for pyelonephritis; started abx prophylaxis 06/2015  . Syncope    Hypotensive; ED visit--Dr. Terrence Dupont did Cath--nonobstructive CAD, EF 55-60%.  In retrospect, suspect pt rx med misuse/polypharmacy   BP 122/75   Pulse 83   Temp 98.6 F (37 C)   Ht 5\' 4"  (1.626 m)   Wt 143 lb 3.2 oz (65 kg)   SpO2 92%   BMI 24.58 kg/m   Opioid Risk Score:   Fall Risk Score:  `1  Depression screen PHQ 2/9  Depression screen [facility] 2/9 11/01/2020 09/29/2020 07/24/2020 04/26/2020 09/27/2019 02/24/2019 12/25/2018  Decreased Interest 1 0 1 1 1 1 1   Down, Depressed, Hopeless 1 0 1 1 2 1 1   PHQ - 2 Score 2 0 2 2 3 2 2   Altered sleeping - 1 - - 1 - -  Tired, decreased energy - 3 - - 1 - -  Change in appetite - 3 - - 0 - -  Feeling bad or failure about yourself  - 0 - - 0 - -  Trouble  concentrating - 3 - - 1 - -  Moving slowly or fidgety/restless - 0 - - 0 - -  Suicidal thoughts - 0 - - 0 - -  PHQ-9 Score - 10 - - 6 - -  Difficult doing work/chores - - - - Not difficult at all - -  Some recent data might be hidden    Review of Systems  Constitutional: Negative.   HENT: Negative.   Eyes: Negative.   Respiratory: Negative.   Cardiovascular: Negative.   Gastrointestinal: Negative.   Endocrine: Negative.   Genitourinary: Negative.   Musculoskeletal: Positive for arthralgias.  Skin: Negative.   Allergic/Immunologic: Negative.   Neurological: Negative.   Hematological: Negative.   Psychiatric/Behavioral: Positive for dysphoric mood.  All other systems reviewed and are negative.      Objective:   Physical Exam Gen: no distress, normal appearing HEENT: oral mucosa pink and moist, NCAT Cardio: Reg rate Chest: normal effort, normal rate of breathing Abd: soft, non-distended Ext: no edema Skin: intact.  Small nodule in the webspace of the fourth and fifth toes of the right foot.  There is a area of scab present.  No drainage.  Area is tender to palpation Neuro: Normal cranial nerve exam.  Strength generally functional except for where she has pain. Musculoskeletal: Bilateral knees with valgus deformities left more than right.  Joint laxity noted as well as crepitus with bilateral range of motion.  She has pain with palpation over the quadriceps muscles on the left.  Pain worse with resisted knee extension and hip flexion.  Does not seem to have a lot of difficulty with weightbearing on the left side. Psych: pleasant, normal affect        Assessment & Plan:  1.  Chronic low back pain with lumbar radiculopathy 2.  Bilateral osteoarthritis of the knees right greater than left 3.  Fibromyalgia 4.  History of cervicalgia with cervical radiculitis 5.  Bilateral greater trochanter bursitis 6.  Reactive depression   Plan: 1.  Refill oxycodone 10/325 1 every 6  hours as needed #100.  -We will continue the controlled substance monitoring program, this consists of regular clinic visits, examinations, routine drug screening, pill counts as well as use of [place] Controlled Substance Reporting System. [place].    -Medication was refilled and a second prescription was sent to the patient's pharmacy for next month.   2.  Status post bilateral Zilretta injections in September 3.  New left thigh pain.  This appears to be a quadriceps muscle strain.  May be due to her altered gait related to this right foot lesion.  -Discussed some basic quadriceps stretches today.  -Encouraged use of regular heat and even ice if needed.  -Maintain activity as tolerated 4.  Right foot insert digit toe nodule: Per podiatry  15 minutes of time was spent in the office today with the patient.  I will see her back in about 2 months for bilateral Zilretta injections of the knees.

## 2020-11-02 ENCOUNTER — Ambulatory Visit: Payer: Medicare Other | Admitting: Podiatry

## 2020-11-06 ENCOUNTER — Ambulatory Visit: Payer: Medicare Other | Admitting: Podiatry

## 2020-11-06 DIAGNOSIS — L821 Other seborrheic keratosis: Secondary | ICD-10-CM | POA: Diagnosis not present

## 2020-11-06 DIAGNOSIS — L570 Actinic keratosis: Secondary | ICD-10-CM | POA: Diagnosis not present

## 2020-11-06 DIAGNOSIS — D1801 Hemangioma of skin and subcutaneous tissue: Secondary | ICD-10-CM | POA: Diagnosis not present

## 2020-11-06 DIAGNOSIS — L812 Freckles: Secondary | ICD-10-CM | POA: Diagnosis not present

## 2020-11-06 DIAGNOSIS — I788 Other diseases of capillaries: Secondary | ICD-10-CM | POA: Diagnosis not present

## 2020-11-06 DIAGNOSIS — L718 Other rosacea: Secondary | ICD-10-CM | POA: Diagnosis not present

## 2020-11-06 DIAGNOSIS — D225 Melanocytic nevi of trunk: Secondary | ICD-10-CM | POA: Diagnosis not present

## 2020-11-09 ENCOUNTER — Other Ambulatory Visit: Payer: Self-pay

## 2020-11-09 ENCOUNTER — Other Ambulatory Visit: Payer: Self-pay | Admitting: Family Medicine

## 2020-11-09 ENCOUNTER — Ambulatory Visit (INDEPENDENT_AMBULATORY_CARE_PROVIDER_SITE_OTHER): Payer: Medicare Other | Admitting: Podiatry

## 2020-11-09 ENCOUNTER — Other Ambulatory Visit: Payer: Self-pay | Admitting: Physical Medicine & Rehabilitation

## 2020-11-09 ENCOUNTER — Encounter: Payer: Self-pay | Admitting: Podiatry

## 2020-11-09 DIAGNOSIS — M797 Fibromyalgia: Secondary | ICD-10-CM

## 2020-11-09 DIAGNOSIS — M2041 Other hammer toe(s) (acquired), right foot: Secondary | ICD-10-CM

## 2020-11-09 DIAGNOSIS — M47816 Spondylosis without myelopathy or radiculopathy, lumbar region: Secondary | ICD-10-CM

## 2020-11-09 DIAGNOSIS — L84 Corns and callosities: Secondary | ICD-10-CM | POA: Diagnosis not present

## 2020-11-12 NOTE — Progress Notes (Signed)
  Subjective:  Patient ID: Linda [name], female    DOB: 1946-04-14,  MRN: [id number]  Chief Complaint  Patient presents with  . Callouses    painful callus lesion    74 y.o. female presents with the above complaint. History confirmed with patient. Between the 4th and 5th toes, the 5th toe hurts all the time. She took antibiotics for this because they thought it may be a wound. She lives in an assisted living facility.  Objective:  Physical Exam: warm, good capillary refill, no trophic changes or ulcerative lesions, normal DP and PT pulses and normal sensory exam.  Heloma molle kissing lesion fourth interspace right foot.  Semireducible hammertoes 2 through 5 right foot  Assessment:  No diagnosis found.   Plan:  Patient was evaluated and treated and all questions answered.  All symptomatic hyperkeratoses were safely debrided with a sterile #15 blade to patient's level of comfort without incident. We discussed preventative and palliative care of these lesions including supportive and accommodative shoegear, padding, prefabricated and custom molded accommodative orthoses, use of a pumice stone and lotions/creams daily.  Discussed with her the etiology of the heloma molle and how hammertoe deformity contributes to it.  I discussed that if it is not improving we could consider surgical correction of hammertoes in order to alleviate the lesion.  We will consider this and take x-rays at next visit if she would like to pursue this  Return in about 3 weeks (around 11/30/2020).

## 2020-11-22 ENCOUNTER — Other Ambulatory Visit: Payer: Self-pay | Admitting: Family Medicine

## 2020-11-24 ENCOUNTER — Observation Stay (HOSPITAL_BASED_OUTPATIENT_CLINIC_OR_DEPARTMENT_OTHER)
Admission: EM | Admit: 2020-11-24 | Discharge: 2020-11-25 | Disposition: A | Discharge status: Home / Self Care | Payer: Medicare Other | Attending: Internal Medicine | Admitting: Internal Medicine

## 2020-11-24 ENCOUNTER — Encounter (HOSPITAL_BASED_OUTPATIENT_CLINIC_OR_DEPARTMENT_OTHER): Payer: Self-pay | Admitting: Emergency Medicine

## 2020-11-24 ENCOUNTER — Emergency Department (HOSPITAL_BASED_OUTPATIENT_CLINIC_OR_DEPARTMENT_OTHER): Payer: Medicare Other

## 2020-11-24 ENCOUNTER — Other Ambulatory Visit: Payer: Self-pay

## 2020-11-24 DIAGNOSIS — Z20822 Contact with and (suspected) exposure to covid-19: Secondary | ICD-10-CM | POA: Insufficient documentation

## 2020-11-24 DIAGNOSIS — I4510 Unspecified right bundle-branch block: Secondary | ICD-10-CM | POA: Diagnosis not present

## 2020-11-24 DIAGNOSIS — R400 Somnolence: Principal | ICD-10-CM | POA: Insufficient documentation

## 2020-11-24 DIAGNOSIS — R2689 Other abnormalities of gait and mobility: Secondary | ICD-10-CM | POA: Diagnosis not present

## 2020-11-24 DIAGNOSIS — R069 Unspecified abnormalities of breathing: Secondary | ICD-10-CM | POA: Diagnosis not present

## 2020-11-24 DIAGNOSIS — M549 Dorsalgia, unspecified: Secondary | ICD-10-CM | POA: Diagnosis not present

## 2020-11-24 DIAGNOSIS — M25461 Effusion, right knee: Secondary | ICD-10-CM | POA: Diagnosis not present

## 2020-11-24 DIAGNOSIS — R079 Chest pain, unspecified: Secondary | ICD-10-CM | POA: Diagnosis not present

## 2020-11-24 DIAGNOSIS — G9340 Encephalopathy, unspecified: Secondary | ICD-10-CM | POA: Diagnosis not present

## 2020-11-24 DIAGNOSIS — M1712 Unilateral primary osteoarthritis, left knee: Secondary | ICD-10-CM

## 2020-11-24 DIAGNOSIS — J45909 Unspecified asthma, uncomplicated: Secondary | ICD-10-CM | POA: Diagnosis not present

## 2020-11-24 DIAGNOSIS — M25552 Pain in left hip: Secondary | ICD-10-CM | POA: Diagnosis not present

## 2020-11-24 DIAGNOSIS — M25462 Effusion, left knee: Secondary | ICD-10-CM | POA: Diagnosis not present

## 2020-11-24 DIAGNOSIS — J449 Chronic obstructive pulmonary disease, unspecified: Secondary | ICD-10-CM | POA: Insufficient documentation

## 2020-11-24 DIAGNOSIS — Z79899 Other long term (current) drug therapy: Secondary | ICD-10-CM | POA: Diagnosis not present

## 2020-11-24 DIAGNOSIS — I2510 Atherosclerotic heart disease of native coronary artery without angina pectoris: Secondary | ICD-10-CM | POA: Diagnosis not present

## 2020-11-24 DIAGNOSIS — M25551 Pain in right hip: Secondary | ICD-10-CM | POA: Diagnosis not present

## 2020-11-24 DIAGNOSIS — R404 Transient alteration of awareness: Secondary | ICD-10-CM | POA: Diagnosis not present

## 2020-11-24 DIAGNOSIS — I10 Essential (primary) hypertension: Secondary | ICD-10-CM | POA: Diagnosis not present

## 2020-11-24 DIAGNOSIS — R269 Unspecified abnormalities of gait and mobility: Secondary | ICD-10-CM | POA: Diagnosis not present

## 2020-11-24 DIAGNOSIS — R2681 Unsteadiness on feet: Secondary | ICD-10-CM

## 2020-11-24 DIAGNOSIS — I5032 Chronic diastolic (congestive) heart failure: Secondary | ICD-10-CM | POA: Insufficient documentation

## 2020-11-24 DIAGNOSIS — Z7982 Long term (current) use of aspirin: Secondary | ICD-10-CM | POA: Insufficient documentation

## 2020-11-24 DIAGNOSIS — M170 Bilateral primary osteoarthritis of knee: Secondary | ICD-10-CM | POA: Diagnosis not present

## 2020-11-24 DIAGNOSIS — R531 Weakness: Secondary | ICD-10-CM | POA: Diagnosis present

## 2020-11-24 DIAGNOSIS — R0902 Hypoxemia: Secondary | ICD-10-CM | POA: Diagnosis not present

## 2020-11-24 DIAGNOSIS — W19XXXA Unspecified fall, initial encounter: Secondary | ICD-10-CM

## 2020-11-24 DIAGNOSIS — I6381 Other cerebral infarction due to occlusion or stenosis of small artery: Secondary | ICD-10-CM | POA: Diagnosis not present

## 2020-11-24 DIAGNOSIS — M2548 Effusion, other site: Secondary | ICD-10-CM | POA: Diagnosis not present

## 2020-11-24 DIAGNOSIS — M797 Fibromyalgia: Secondary | ICD-10-CM

## 2020-11-24 LAB — COMPREHENSIVE METABOLIC PANEL
ALT: 13 U/L (ref 0–44)
AST: 18 U/L (ref 15–41)
Albumin: 3.7 g/dL (ref 3.5–5.0)
Alkaline Phosphatase: 58 U/L (ref 38–126)
Anion gap: 7 (ref 5–15)
BUN: 10 mg/dL (ref 8–23)
CO2: 29 mmol/L (ref 22–32)
Calcium: 8.5 mg/dL — ABNORMAL LOW (ref 8.9–10.3)
Chloride: 103 mmol/L (ref 98–111)
Creatinine, Ser: 0.71 mg/dL (ref 0.44–1.00)
GFR, Estimated: >60 mL/min (ref >60)
Glucose, Bld: 91 mg/dL (ref 70–99)
Potassium: 3.4 mmol/L — ABNORMAL LOW (ref 3.5–5.1)
Sodium: 139 mmol/L (ref 135–145)
Total Bilirubin: 0.3 mg/dL (ref 0.3–1.2)
Total Protein: 6.6 g/dL (ref 6.5–8.1)

## 2020-11-24 LAB — CBC WITH DIFFERENTIAL/PLATELET
Abs Immature Granulocytes: 0.02 10*3/uL (ref 0.00–0.07)
Basophils Absolute: 0.1 10*3/uL (ref 0.0–0.1)
Basophils Relative: 1 %
Eosinophils Absolute: 0.2 10*3/uL (ref 0.0–0.5)
Eosinophils Relative: 2 %
HCT: 43.6 % (ref 36.0–46.0)
Hemoglobin: 14 g/dL (ref 12.0–15.0)
Immature Granulocytes: 0 %
Lymphocytes Relative: 28 %
Lymphs Abs: 2.2 10*3/uL (ref 0.7–4.0)
MCH: 29 pg (ref 26.0–34.0)
MCHC: 32.1 g/dL (ref 30.0–36.0)
MCV: 90.5 fL (ref 80.0–100.0)
Monocytes Absolute: 0.7 10*3/uL (ref 0.1–1.0)
Monocytes Relative: 9 %
Neutro Abs: 4.6 10*3/uL (ref 1.7–7.7)
Neutrophils Relative %: 60 %
Platelets: 197 10*3/uL (ref 150–400)
RBC: 4.82 MIL/uL (ref 3.87–5.11)
RDW: 13 % (ref 11.5–15.5)
WBC: 7.7 10*3/uL (ref 4.0–10.5)
nRBC: 0 % (ref 0.0–0.2)

## 2020-11-24 LAB — RAPID URINE DRUG SCREEN, HOSP PERFORMED
Amphetamines: NOT DETECTED
Barbiturates: NOT DETECTED
Benzodiazepines: POSITIVE — AB
Cocaine: NOT DETECTED
Opiates: POSITIVE — AB
Tetrahydrocannabinol: NOT DETECTED

## 2020-11-24 LAB — I-STAT VENOUS BLOOD GAS, ED
Acid-Base Excess: 4 mmol/L — ABNORMAL HIGH (ref 0.0–2.0)
Bicarbonate: 30.8 mmol/L — ABNORMAL HIGH (ref 20.0–28.0)
Calcium, Ion: 1.18 mmol/L (ref 1.15–1.40)
HCT: 36 % (ref 36.0–46.0)
Hemoglobin: 12.2 g/dL (ref 12.0–15.0)
O2 Saturation: 62 %
Patient temperature: 98.5
Potassium: 3.7 mmol/L (ref 3.5–5.1)
Sodium: 143 mmol/L (ref 135–145)
TCO2: 32 mmol/L (ref 22–32)
pCO2, Ven: 53.5 mmHg (ref 44.0–60.0)
pH, Ven: 7.368 (ref 7.250–7.430)
pO2, Ven: 34 mmHg (ref 32.0–45.0)

## 2020-11-24 LAB — URINALYSIS, ROUTINE W REFLEX MICROSCOPIC
Bilirubin Urine: NEGATIVE
Glucose, UA: NEGATIVE mg/dL
Hgb urine dipstick: NEGATIVE
Ketones, ur: NEGATIVE mg/dL
Nitrite: NEGATIVE
Protein, ur: NEGATIVE mg/dL
Specific Gravity, Urine: 1.015 (ref 1.005–1.030)
pH: 7.5 (ref 5.0–8.0)

## 2020-11-24 LAB — TROPONIN I (HIGH SENSITIVITY)
Troponin I (High Sensitivity): 4 ng/L (ref <18)
Troponin I (High Sensitivity): 4 ng/L (ref <18)

## 2020-11-24 LAB — URINALYSIS, MICROSCOPIC (REFLEX): RBC / HPF: NONE SEEN RBC/hpf (ref 0–5)

## 2020-11-24 LAB — BRAIN NATRIURETIC PEPTIDE: B Natriuretic Peptide: 70.2 pg/mL (ref 0.0–100.0)

## 2020-11-24 LAB — RESP PANEL BY RT-PCR (FLU A&B, COVID) ARPGX2
Influenza A by PCR: NEGATIVE
Influenza B by PCR: NEGATIVE
SARS Coronavirus 2 by RT PCR: NEGATIVE

## 2020-11-24 MED ORDER — OXYCODONE-ACETAMINOPHEN 5-325 MG PO TABS
2.0000 | ORAL_TABLET | Freq: Once | ORAL | Status: AC
  Administered 2020-11-24: 2 via ORAL
  Filled 2020-11-24: qty 2

## 2020-11-24 NOTE — ED Triage Notes (Signed)
Per EMS:  Called out for lift assistance from home.  Pt was sob, sats low, pt unable to walk.  Pt refuses to wear home O2.

## 2020-11-24 NOTE — ED Notes (Signed)
Returns from radiology, placed back on cardiac monitor with cont POX monitoring and q25min NBP assessments. sr x 2 remain up, callbell within reach, significant other at bedside

## 2020-11-24 NOTE — ED Provider Notes (Signed)
Emergency Department Provider Note   I have reviewed the triage vital signs and the nursing notes.   HISTORY  Chief Complaint Fall   HPI Linda [name] is a 74 y.o. female past medical history reviewed below presents to the emergency department with generalized weakness, somnolence, shortness of breath.  She has a Brandun Pinn history of chronic back and knee pain and does take  chronic pain as well as anxiety medications.  She states that she last took these this morning and the dosing is not changed.  She has several episodes where she feels like her knees are giving out and causing her to almost fall.  She is able to catch herself.  She has seen Dr. Ricki Rodriguez for this in the past but with her heart history she is deemed a poor operative candidate.  She does not use a walker.  Her significant other is at bedside states that she has been somnolent and falling asleep frequently which is caused him to grow increasingly concerned over the past 24 hours.  She fell while cleaning the closet today and was unable to get up on her own and ultimately required EMS transportation. No active CP. No abdominal pain. No vomiting or diarrhea.   Past Medical History:  Diagnosis Date  . Acute upper GI bleed 06/2014   while pt taking coumadin, plavix, and meloxicam---despite being told not to take coumadin.  . Anginal pain ([place])    Nonobstructive CAD 2014; however, her cardiologist put her on a statin for this and NOT for hyperlipidemia per pt report.  Atyp CP 08/2017 at card f/u, plan for myoc perf imaging.  . Anxiety    panic attacks  . Asthma    w/ asbestososis   . BPPV (benign paroxysmal positional vertigo) 12/16/2012  . Chronic diastolic CHF (congestive heart failure) (HCC)    dry wt as of 11/06/16 is 168 lbs.  . Chronic lower back pain   . COPD (chronic obstructive pulmonary disease) ([place])   . DDD (degenerative disc disease)    lumbar and cervical.   . Diverticular disease   . Fibromyalgia     Patient states dx was around her late 1s but she had sx's for years prior to this.  . H/O hiatal hernia   . History of pneumonia    hospitalized 12/2011, 02/2013, and 07/2013 [facility]) for this  . HTN (hypertension)    Renal artery dopplers 04/2013 neg for stenosis.  . Hypervitaminosis D 09/27/2019   over-supplemented.  Stopped vit D and plan recheck 2 mo.  . Idiopathic angio-edema-urticaria [id number]; 2021   Angioedema component was very minimal.  2021->Dr. Bobbitt (allergist) eval.  . Insomnia   . Iron deficiency anemia    Hematologist in [place], [place] did extensive w/u; no cause found; failed oral supplement;; gets fairly regular (q67m or so) IV iron infusions (Venofer -iron sucrose- 200mg  with procrit.  "for 14 yr I've been getting blood work q month & getting infusions prn" (07/12/2013).  Dr. Marin Olp locally, iron infusions done, EPO deficiency dx'd  . Migraine syndrome    "not as often anymore; used to be ~ q wk" (07/12/2013)  . Mixed incontinence urge and stress   . Nephrolithiasis    "passed all on my own or they are still in there" (07/12/2013)  . Neuroleptic induced parkinsonism ([place]) 2018   Dr. Carles Collet, neuro, saw her 11/24/17 and recommended d/c of abilify as first step.  D/c'd abilify and pt got complete recovery.  . Oropharyngeal dysphagia  swallowing study speech path 05/2020. Gastric bx's showed gastritis, h pylori NEG  . OSA on CPAP    prior to move to [place]--had another sleep study 10/2015 w/pulm Dr. Camillo Flaming.  . Osteoarthritis    "severe; progressing fast" (07/12/2013); multiple joints-not surgical candidate for TKR (03/2015).  Triamcinolon knee injections by Dr. Tessa Lerner 12/2017.  [name] Kitchen Pernicious anemia 08/24/2014  . Pleural plaque with presence of asbestos 07/22/2013  . Pulmonary embolism ([place]) 07/2013   Dx at [facility] with very small peripheral upper lobe pe 07/2013: pt took coumadin for about 8-9 mo  . Pyelonephritis    "several times over the last 30 yr" (07/12/2013)  . RBBB (right bundle branch  block)   . Recurrent major depression ([place])   . Recurrent UTI    hx of hospitalization for pyelonephritis; started abx prophylaxis 06/2015  . Syncope    Hypotensive; ED visit--Dr. Terrence Dupont did Cath--nonobstructive CAD, EF 55-60%.  In retrospect, suspect pt rx med misuse/polypharmacy    Patient Active Problem List   Diagnosis Date Noted  . Dysphagia 06/15/2020  . Allergic reaction 02/01/2020  . Idiopathic angioedema 01/20/2020  . Dysarthria 01/20/2020  . Hypotension 10/04/2019  . COPD (chronic obstructive pulmonary disease) ([place]) 01/21/2018  . Pulmonary asbestosis ([place]) 01/21/2018  . Hypomagnesemia 01/02/2018  . Failure to thrive in adult 01/01/2018  . Altered mental status 10/21/2017  . Lumbar radiculitis 02/10/2017  . Low serum erythropoietin level 10/17/2016  . RBBB 09/23/2016  . Primary osteoarthritis of left knee 06/19/2016  . Debilitated patient 06/06/2016  . CHF (congestive heart failure) ([place]) 05/30/2016  . Chronic respiratory failure with hypoxia ([place]) 02/07/2016  . Restrictive lung disease 02/07/2016  . Rotator cuff syndrome of right shoulder 01/10/2016  . Encephalopathy, metabolic 27/51/7001  . Fall at home 01/01/2016  . Rhabdomyolysis 01/01/2016  . Diastolic heart failure ([place]) 10/16/2015  . CAD-minor 2014 08/16/2015  . Chest pain with moderate risk for cardiac etiology 08/16/2015  . Narrowing of intervertebral disc space 07/17/2015  . Bilateral lower leg pain 01/24/2015  . 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 Chronic pain syndrome 11/21/2014  . Anxiety and depression 11/21/2014  . Hypokalemia 11/21/2014  . Hyperlipidemia 11/21/2014  . Chronic pain disorder 11/21/2014  . Primary osteoarthritis of right knee 10/18/2014  . Pernicious anemia 08/24/2014  . Generalized anxiety disorder--with occasional panic attacks.  08/05/2014  . Recurrent major depression-severe ([place]) 08/05/2014  . Multifactorial gait disorder 07/26/2014  . Epistaxis 07/18/2014  . Acute GI  bleeding 07/17/2014  . Anemia associated with acute blood loss 07/17/2014  . Syncope 07/17/2014  . History of pulmonary embolism 07/17/2014  . GI bleed 07/17/2014  . Arthritis 05/11/2014  . DDD (degenerative disc disease) 05/11/2014  . Fibrositis 05/11/2014  . Amianthosis ([place]) 05/11/2014  . [name] ([place]) 05/11/2014  . Grief 04/28/2014  . OSA (obstructive sleep apnea) 04/24/2014  . Chronic diastolic CHF, NYHA class 1 04/24/2014  . Atelectasis 08/06/2013  . Acute pulmonary embolism ([place]) 08/04/2013  . Pleural plaque with presence of asbestos 07/22/2013  . Polypharmacy 04/26/2013  . Fibromyalgia syndrome 03/01/2013  . Insomnia 11/12/2012  . HTN (hypertension), benign 10/25/2012    Past Surgical History:  Procedure Laterality Date  . APPENDECTOMY  1960  . AXILLARY SURGERY Left 1978   Multiple "lump" in armpit per pt  . BIOPSY  06/17/2020   Gastric bx->gastritis, h pylori neg.  Procedure: BIOPSY;  Surgeon: Carol Ada, MD;  Location: [facility] ENDOSCOPY;  Service: Endoscopy;;  . CARDIAC CATHETERIZATION  01/2013  nonobstructive CAD, EF 55-60%  . CARDIOVASCULAR STRESS TEST  02/22/15   Low risk myocard perf imaging; wall motion normal, normal EF  . carotid duplex doppler  10/21/2017   R vertebral flow suggestive of possible distal obstruction.  Pt declined further w/u as of 10/29/17 but need to revisit this problem periodically.  . COCCYX REMOVAL  1972  . DEXA  06/05/2017   T-score -3.1  . DILATION AND CURETTAGE OF UTERUS  ? 1970's  . ESOPHAGOGASTRODUODENOSCOPY N/A 07/19/2014   Gastritis found + in the setting of supratherapeutic INR, +plavix, + meloxicam.  . ESOPHAGOGASTRODUODENOSCOPY (EGD) WITH PROPOFOL N/A 06/17/2020   NO stricture or other prob to explain pt's dysphagia, dilation was done anyway.  Gastric bx's-->gastritis, h pylori neg. Procedure: ESOPHAGOGASTRODUODENOSCOPY (EGD) WITH PROPOFOL;  Surgeon: Carol Ada, MD;  Location: [facility] END[facility];  Service: Endoscopy;  Laterality: N/A;   . EYE SURGERY Left 2012-2013   "injections for ~ 1 yr; don't really know what for" (07/12/2013)  . HEEL SPUR SURGERY Left 2008  . KNEE SURGERY  2005  . LEFT HEART CATHETERIZATION WITH CORONARY ANGIOGRAM N/A 01/30/2013   Procedure: LEFT HEART CATHETERIZATION WITH CORONARY ANGIOGRAM;  Surgeon: Clent Demark, MD;  Location: [facility] CATH LAB;  Service: Cardiovascular;  Laterality: N/A;  . MALONEY DILATION  06/17/2020   Procedure: Venia Minks DILATION;  Surgeon: Carol Ada, MD;  Location: [facility] END[facility];  Service: Endoscopy;;  . PLANTAR FASCIA RELEASE Left 2008  . SPIROMETRY  04/25/14   In hosp for acute asthma/COPD flare: mixed obstructive and restrictive lung disease. The FEV1 is severely reduced at 45% predicted.  FEV1 signif decreased compared to prior spirometry 07/23/13.  . TENDON RELEASE  1996   Right forearm and hand  . TOTAL ABDOMINAL HYSTERECTOMY  1974  . TRANSTHORACIC ECHOCARDIOGRAM  01/2013; 04/2014;08/2015; 09/2017   2014--NORMAL.  2015--focal basal septal hypertrophy, EF 55-60%, grade I diast dysfxn, mild LAE.  08/2015 EF 55-60%, nl LV syst fxn, grade I DD, valves wnl. 10/21/17: EF 65-70%, grd I DD, o/w normal.    Allergies Abilify [aripiprazole] and Penicillins  Family History  Problem Relation Age of Onset  . Arthritis Mother   . Kidney disease Mother   . Heart disease Father   . Stroke Father   . Hypertension Father   . Diabetes Father   . Heart attack Father   . Heart attack Paternal Grandmother   . Diabetes Sister        one sister  . Hypertension Sister   . Asthma Sister   . Hypertension Brother   . Asthma Brother   . Asthma Daughter   . Multiple sclerosis Son     Social History Social History   Tobacco Use  . Smoking status: Never Smoker  . Smokeless tobacco: Never Used  . Tobacco comment: never used tobacco  Vaping Use  . Vaping Use: Never used  Substance Use Topics  . Alcohol use: No    Alcohol/week: 0.0 standard drinks  . Drug use: No    Review of Systems    Constitutional: No fever/chills. Positive generalized weakness and somnolence.  Eyes: No visual changes. ENT: No sore throat. Cardiovascular: Denies chest pain. Respiratory: Denies shortness of breath. Gastrointestinal: No abdominal pain.  No nausea, no vomiting.  No diarrhea.  No constipation. Genitourinary: Negative for dysuria. Musculoskeletal: Chronic back, knee, and hip pain.  Skin: Negative for rash. Neurological: Negative for headaches, focal weakness or numbness.  10-point ROS otherwise negative.  ____________________________________________   PHYSICAL EXAM:  VITAL  SIGNS: ED Triage Vitals  Enc Vitals Group     BP 11/24/20 1721 (!) 168/82     Pulse Rate 11/24/20 1721 71     Resp 11/24/20 1721 13     Temp 11/24/20 1721 98.5 F (36.9 C)     Temp Source 11/24/20 1721 Oral     SpO2 11/24/20 1643 94 %     Weight 11/24/20 1722 140 lb (63.5 kg)     Height 11/24/20 1722 5\' 4"  (1.626 m)   Constitutional: Alert and oriented. Mild somnolence. No acute distress. Participates with exam and history.  Eyes: Conjunctivae are normal. PERRL. EOMI. Head: Atraumatic. Nose: No congestion/rhinnorhea. Mouth/Throat: Mucous membranes are slightly dry. Neck: No stridor.  No cervical spine tenderness to palpation. Cardiovascular: Normal rate, regular rhythm. Good peripheral circulation. Grossly normal heart sounds.   Respiratory: Normal respiratory effort.  No retractions. Lungs CTAB. Gastrointestinal: Soft and nontender. No distention.  Musculoskeletal: No lower extremity tenderness nor edema. No gross deformities of extremities. Neurologic:  Normal speech and language. No gross focal neurologic deficits are appreciated. No facial asymmetry. 4+/5 strength throughout.  Skin:  Skin is warm, dry and intact. No rash noted.   ____________________________________________   LABS (all labs ordered are listed, but only abnormal results are displayed)  Labs Reviewed  COMPREHENSIVE METABOLIC  PANEL - Abnormal; Notable for the following components:      Result Value   Potassium 3.4 (*)    Calcium 8.5 (*)    All other components within normal limits  I-STAT VENOUS BLOOD GAS, ED - Abnormal; Notable for the following components:   Bicarbonate 30.8 (*)    Acid-Base Excess 4.0 (*)    All other components within normal limits  RESP PANEL BY RT-PCR (FLU A&B, COVID) ARPGX2  URINE CULTURE  BRAIN NATRIURETIC PEPTIDE  CBC WITH DIFFERENTIAL/PLATELET  URINALYSIS, ROUTINE W REFLEX MICROSCOPIC  RAPID URINE DRUG SCREEN, HOSP PERFORMED  TROPONIN I (HIGH SENSITIVITY)  TROPONIN I (HIGH SENSITIVITY)   ____________________________________________  EKG   EKG Interpretation  Date/Time:  Friday November 24 2020 17:02:54 EST Ventricular Rate:  69 PR Interval:    QRS Duration: 161 QT Interval:  455 QTC Calculation: 488 R Axis:   14 Text Interpretation: Sinus rhythm. Right bundle branch block No STEMI Confirmed by Nanda Quinton [phone]) on 11/24/2020 6:14:37 PM       ____________________________________________  RADIOLOGY  DG Chest 1 View  Result Date: 11/24/2020 CLINICAL DATA:  Pain status post fall EXAM: CHEST  1 VIEW COMPARISON:  June 15, 2020 FINDINGS: Calcified pleural based plaques are again noted bilaterally. There is no pneumothorax. No definite focal infiltrate. The heart size is stable. Aortic calcifications are noted. There is no definite acute osseous abnormality. IMPRESSION: No active disease. Electronically Signed   By: Constance Holster M.D.   On: 11/24/2020 19:15   DG Knee 2 Views Left  Result Date: 11/24/2020 CLINICAL DATA:  Pain EXAM: LEFT KNEE - 1-2 VIEW COMPARISON:  None recent FINDINGS: There is a moderate suprapatellar joint effusion. There are advanced tricompartmental degenerative changes. There is no acute displaced fracture or dislocation. IMPRESSION: 1. No acute displaced fracture or dislocation. 2. Moderate suprapatellar joint effusion. 3. Advanced  tricompartmental degenerative changes. Electronically Signed   By: Constance Holster M.D.   On: 11/24/2020 19:37   DG Knee 2 Views Right  Result Date: 11/24/2020 CLINICAL DATA:  Pain EXAM: RIGHT KNEE - 1-2 VIEW COMPARISON:  None. FINDINGS: There is a small suprapatellar joint effusion. There are moderate  tricompartmental degenerative changes. There is no acute displaced fracture. No dislocation. IMPRESSION: Small suprapatellar joint effusion. Moderate tricompartmental degenerative changes. No acute displaced fracture or dislocation. Electronically Signed   By: Constance Holster M.D.   On: 11/24/2020 19:37   CT Head Wo Contrast  Result Date: 11/24/2020 CLINICAL DATA:  A status changes. EXAM: CT HEAD WITHOUT CONTRAST TECHNIQUE: Contiguous axial images were obtained from the base of the skull through the vertex without intravenous contrast. COMPARISON:  01/12/2020 FINDINGS: Brain: There is no evidence for acute hemorrhage, hydrocephalus, mass lesion, or abnormal extra-axial fluid collection. No definite CT evidence for acute infarction. Diffuse loss of parenchymal volume is consistent with atrophy. Patchy low attenuation in the deep hemispheric and periventricular white matter is nonspecific, but likely reflects chronic microvascular ischemic demyelination. Small lacunar infarcts noted basal ganglia bilaterally. Vascular: No hyperdense vessel or unexpected calcification. Skull: No evidence for fracture. No worrisome lytic or sclerotic lesion. Sinuses/Orbits: The visualized paranasal sinuses and mastoid air cells are clear. Visualized portions of the globes and intraorbital fat are unremarkable. Other: None. IMPRESSION: 1. No acute intracranial abnormality. 2. Atrophy with chronic small vessel white matter ischemic disease. Electronically Signed   By: Misty Stanley M.D.   On: 11/24/2020 18:59   DG HIPS BILAT WITH PELVIS MIN 5 VIEWS  Result Date: 11/24/2020 CLINICAL DATA:  74 year old female with fall and  bilateral hip pain. EXAM: DG HIP (WITH OR WITHOUT PELVIS) 5+V BILAT COMPARISON:  Pelvic radiograph dated 10/21/2017. FINDINGS: There is no acute fracture or dislocation. The bones are osteopenic. Mild bilateral hip arthritic changes. The soft tissues are unremarkable. There is large amount of stool throughout the colon. IMPRESSION: No acute fracture or dislocation. Electronically Signed   By: Anner Crete M.D.   On: 11/24/2020 19:16    ____________________________________________   PROCEDURES  Procedure(s) performed:   Procedures  None  ____________________________________________   INITIAL IMPRESSION / ASSESSMENT AND PLAN / ED COURSE  Pertinent labs & imaging results that were available during my care of the patient were reviewed by me and considered in my medical decision making (see chart for details).   Patient presents to the emergency department for evaluation of somnolence, generalized weakness, falls.  She is on sedating medication including Xanax and Percocet but dosing has not changed.  She is supposed to be on home O2 but is weaned herself off over the past year and has not been on any oxygen in the past 4 to 5 months.  No focal neurologic deficits. Doubt SDH but will send for CT head, VBG, and additional labs.   07:57 PM  On reassessment the patient seems slightly more awake than my initial evaluation.  Her significant other at bedside tells me that she does seem more herself now.  Question if the patient was becoming progressively more more hypoxemic at home after weaning herself off of home O2.  Patient also asking for her Percocet and Xanax which she does typically take together.  We discussed that I could give her her home dose of Percocet but she would need to space out her Xanax prescription.  Question if this could be also contributing to her symptoms.  Lower suspicion for stroke but patient is also high risk for this.  No MRI available at this facility.  Plan for  admission for obs, medication adjustment PRN, and possibly re-starting home O2.   Discussed patient's case with *[facility] to request admission. Patient and family (if present) updated with plan. Care transferred to [facility]  service.  I reviewed all nursing notes, vitals, pertinent old records, EKGs, labs, imaging (as available).  ____________________________________________  FINAL CLINICAL IMPRESSION(S) / ED DIAGNOSES  Final diagnoses:  Somnolence  Gait instability     MEDICATIONS GIVEN DURING THIS VISIT:  Medications  oxyCODONE-acetaminophen (PERCOCET/ROXICET) 5-325 MG per tablet 2 tablet (has no administration in time range)     Note:  This document was prepared using Dragon voice recognition software and may include unintentional dictation errors.  Nanda Quinton, MD, [facility] Emergency Medicine    Marwah Disbro, Wonda Olds, MD 11/25/20 1058

## 2020-11-24 NOTE — ED Notes (Signed)
Pt resting quietly, answers questions promptly and accurately, follows commands, does appear sleepy at this time. MAE x 4, cont on cardiac monitor, NSR remains noted.

## 2020-11-24 NOTE — ED Notes (Signed)
Patient transported to X-ray, via stretcher, sr x 2 up

## 2020-11-24 NOTE — ED Notes (Signed)
[facility] Transport Team at bedside 

## 2020-11-24 NOTE — ED Triage Notes (Signed)
Pt having sob and weakness at home for "3" days.  Pt is not wearing O2.

## 2020-11-24 NOTE — ED Notes (Signed)
BEFAST NEGATIVE, VAN NEGATIVE

## 2020-11-24 NOTE — ED Triage Notes (Addendum)
Per family member:  They had gone out to dinner, she was falling asleep at dinner.  Pt went home and went to bed.  Woke up this am but was very sleepy.  Pt fell at home and EMS was called to help assist back to bed.  Pt has been feeling tired for several weeks.  She is supposed to wear oxygen but hasn't worn it.  Pt has chronic pain with hip and knees.

## 2020-11-24 NOTE — ED Notes (Signed)
ED Provider at bedside. 

## 2020-11-24 NOTE — Progress Notes (Signed)
VBG results given to Dr. Laverta Baltimore. No new orders at this time. RT will continue to monitor.

## 2020-11-24 NOTE — ED Notes (Signed)
Pt medicated per EDP orders secondary to cont c/o bilateral hip pain with bilateral knee pain

## 2020-11-25 ENCOUNTER — Encounter (HOSPITAL_COMMUNITY): Payer: Self-pay | Admitting: Internal Medicine

## 2020-11-25 DIAGNOSIS — E876 Hypokalemia: Secondary | ICD-10-CM

## 2020-11-25 DIAGNOSIS — G9340 Encephalopathy, unspecified: Secondary | ICD-10-CM

## 2020-11-25 DIAGNOSIS — R400 Somnolence: Secondary | ICD-10-CM | POA: Diagnosis not present

## 2020-11-25 LAB — CBC
HCT: 41.5 % (ref 36.0–46.0)
Hemoglobin: 13.3 g/dL (ref 12.0–15.0)
MCH: 28.7 pg (ref 26.0–34.0)
MCHC: 32 g/dL (ref 30.0–36.0)
MCV: 89.4 fL (ref 80.0–100.0)
Platelets: 184 10*3/uL (ref 150–400)
RBC: 4.64 MIL/uL (ref 3.87–5.11)
RDW: 12.9 % (ref 11.5–15.5)
WBC: 7.2 10*3/uL (ref 4.0–10.5)
nRBC: 0 % (ref 0.0–0.2)

## 2020-11-25 LAB — BASIC METABOLIC PANEL
Anion gap: 13 (ref 5–15)
BUN: 6 mg/dL — ABNORMAL LOW (ref 8–23)
CO2: 25 mmol/L (ref 22–32)
Calcium: 9.1 mg/dL (ref 8.9–10.3)
Chloride: 103 mmol/L (ref 98–111)
Creatinine, Ser: 0.65 mg/dL (ref 0.44–1.00)
GFR, Estimated: >60 mL/min (ref >60)
Glucose, Bld: 90 mg/dL (ref 70–99)
Potassium: 3.2 mmol/L — ABNORMAL LOW (ref 3.5–5.1)
Sodium: 141 mmol/L (ref 135–145)

## 2020-11-25 LAB — MAGNESIUM: Magnesium: 1.9 mg/dL (ref 1.7–2.4)

## 2020-11-25 MED ORDER — DARIFENACIN HYDROBROMIDE ER 7.5 MG PO TB24
7.5000 mg | ORAL_TABLET | Freq: Every day | ORAL | Status: DC
  Administered 2020-11-25: 7.5 mg via ORAL
  Filled 2020-11-25: qty 1

## 2020-11-25 MED ORDER — SODIUM CHLORIDE 0.9 % IV SOLN: INTRAVENOUS | Status: DC

## 2020-11-25 MED ORDER — POTASSIUM CHLORIDE CRYS ER 20 MEQ PO TBCR
40.0000 meq | EXTENDED_RELEASE_TABLET | Freq: Once | ORAL | Status: AC
  Administered 2020-11-25: 40 meq via ORAL
  Filled 2020-11-25: qty 2

## 2020-11-25 MED ORDER — OXYCODONE-ACETAMINOPHEN 5-325 MG PO TABS
1.0000 | ORAL_TABLET | Freq: Four times a day (QID) | ORAL | Status: DC | PRN
  Administered 2020-11-25: 1 via ORAL
  Filled 2020-11-25: qty 1

## 2020-11-25 MED ORDER — ISOSORBIDE MONONITRATE ER 30 MG PO TB24
30.0000 mg | ORAL_TABLET | Freq: Every day | ORAL | Status: DC
  Administered 2020-11-25: 30 mg via ORAL
  Filled 2020-11-25: qty 1

## 2020-11-25 MED ORDER — DONEPEZIL HCL 10 MG PO TABS
10.0000 mg | ORAL_TABLET | Freq: Every day | ORAL | Status: DC
  Administered 2020-11-25: 10 mg via ORAL
  Filled 2020-11-25: qty 1

## 2020-11-25 MED ORDER — ISOSORBIDE MONONITRATE ER 30 MG PO TB24
60.0000 mg | ORAL_TABLET | Freq: Every day | ORAL | Status: DC
  Administered 2020-11-25: 60 mg via ORAL
  Filled 2020-11-25: qty 2

## 2020-11-25 MED ORDER — DULOXETINE HCL 30 MG PO CPEP
30.0000 mg | ORAL_CAPSULE | Freq: Every day | ORAL | Status: DC
  Administered 2020-11-25: 30 mg via ORAL
  Filled 2020-11-25: qty 1

## 2020-11-25 MED ORDER — METOPROLOL SUCCINATE ER 25 MG PO TB24
12.5000 mg | ORAL_TABLET | Freq: Every day | ORAL | 0 refills | Status: DC
Start: 2020-11-25 — End: 2020-12-28

## 2020-11-25 MED ORDER — PANTOPRAZOLE SODIUM 40 MG PO TBEC
40.0000 mg | DELAYED_RELEASE_TABLET | Freq: Two times a day (BID) | ORAL | Status: DC
  Administered 2020-11-25 (×2): 40 mg via ORAL
  Filled 2020-11-25 (×2): qty 1

## 2020-11-25 MED ORDER — ENOXAPARIN SODIUM 40 MG/0.4ML ~~LOC~~ SOLN
40.0000 mg | Freq: Every day | SUBCUTANEOUS | Status: DC
  Administered 2020-11-25: 40 mg via SUBCUTANEOUS
  Filled 2020-11-25: qty 0.4

## 2020-11-25 MED ORDER — SENNOSIDES-DOCUSATE SODIUM 8.6-50 MG PO TABS
2.0000 | ORAL_TABLET | Freq: Two times a day (BID) | ORAL | Status: DC
  Administered 2020-11-25 (×2): 2 via ORAL
  Filled 2020-11-25 (×2): qty 2

## 2020-11-25 MED ORDER — ALPRAZOLAM 0.25 MG PO TABS: 0.2500 mg | ORAL_TABLET | Freq: Two times a day (BID) | ORAL | Status: DC | PRN

## 2020-11-25 MED ORDER — METOPROLOL SUCCINATE ER 25 MG PO TB24
ORAL_TABLET | ORAL | 0 refills | Status: DC
Start: 2020-11-25 — End: 2020-11-25

## 2020-11-25 MED ORDER — ROSUVASTATIN CALCIUM 20 MG PO TABS
20.0000 mg | ORAL_TABLET | Freq: Every day | ORAL | Status: DC
  Administered 2020-11-25: 20 mg via ORAL
  Filled 2020-11-25: qty 1

## 2020-11-25 NOTE — Progress Notes (Signed)
DISCHARGE NOTE HOME Linda [name] to be discharged [place] per MD order. Discussed prescriptions and follow up appointments with the patient. Prescriptions given to patient; medication list explained in detail. Patient verbalized understanding.  Skin clean, dry and intact without evidence of skin break down, no evidence of skin tears noted. IV catheter discontinued intact. Site without signs and symptoms of complications. Dressing and pressure applied. Pt denies pain at the site currently. No complaints noted.  Patient free of lines, drains, and wounds.   An After Visit Summary (AVS) was printed and given to the patient. Patient escorted via wheelchair, and discharged home via private auto.  Orville Govern, RN3

## 2020-11-25 NOTE — H&P (Addendum)
History and Physical  [name] KKX:[id number] DOB: Apr 22, 1946 DOA: 11/24/2020  Referring physician: Direct transfer from [facility], accepted by Dr. Darrell [name], [facility] PCP: Linda Sou, MD  Outpatient Specialists: Pain specialist Patient coming from: Home through [facility] ED, direct admit  Chief Complaint: Somnolence and fall.  HPI: Linda [name] is a 74 y.o. female with medical history significant for bilateral knee osteoarthritis, chronic lower back pain, osteoporosis, chronic anxiety/depression, essential hypertension who presented to [facility] ED due to somnolence x 2 days and recurrent falls x2 on the day of her presentation.  At the time of this visit, patient is alert oriented x4.  She reports that she fell because of bilateral knee pain.  She has been receiving knee injections from her pain specialist.  She denies any neurological symptoms.  She stated she has bone-on-bone knee joint and experiences a lot of pain which has caused her to fall.  Denies any prior history of falls.  Due to concern for possible CVA [facility] EDP requested transfer to [facility] for possible MRI brain.  No focal neurological symptoms on exam and patient appears to be back to her baseline mentation.  Will defer MRI brain for now.  Suspect polypharmacy.  She is on high doses of p.o. narcotic and benzodiazepine, prior to admission.  ED Course: Direct admit from [facility] ED.  Review of Systems: Review of systems as noted in the HPI. All other systems reviewed and are negative.   Past Medical History:  Diagnosis Date  . Acute upper GI bleed 06/2014   while pt taking coumadin, plavix, and meloxicam---despite being told not to take coumadin.  . Anginal pain ([place])    Nonobstructive CAD 2014; however, her cardiologist put her on a statin for this and NOT for hyperlipidemia per pt report.  Atyp CP 08/2017 at card f/u, plan for myoc perf imaging.  . Anxiety    panic attacks  . Asthma    w/ asbestososis   . BPPV (benign  paroxysmal positional vertigo) 12/16/2012  . Chronic diastolic CHF (congestive heart failure) (HCC)    dry wt as of 11/06/16 is 168 lbs.  . Chronic lower back pain   . COPD (chronic obstructive pulmonary disease) ([place])   . DDD (degenerative disc disease)    lumbar and cervical.   . Diverticular disease   . Fibromyalgia    Patient states dx was around her late 65s but she had sx's for years prior to this.  . H/O hiatal hernia   . History of pneumonia    hospitalized 12/2011, 02/2013, and 07/2013 [facility]) for this  . HTN (hypertension)    Renal artery dopplers 04/2013 neg for stenosis.  . Hypervitaminosis D 09/27/2019   over-supplemented.  Stopped vit D and plan recheck 2 mo.  . Idiopathic angio-edema-urticaria [id number]; 2021   Angioedema component was very minimal.  2021->Linda [name] (allergist) eval.  . Insomnia   . Iron deficiency anemia    Hematologist in [place], [place] did extensive w/u; no cause found; failed oral supplement;; gets fairly regular (q9m or so) IV iron infusions (Venofer -iron sucrose- 200mg  with procrit.  "for 14 yr I've been getting blood work q month & getting infusions prn" (07/12/2013).  Linda [name] locally, iron infusions done, EPO deficiency dx'd  . Migraine syndrome    "not as often anymore; used to be ~ q wk" (07/12/2013)  . Mixed incontinence urge and stress   . Nephrolithiasis    "passed all on my own or they are still  in there" (07/12/2013)  . Neuroleptic induced parkinsonism ([place]) 2018   Linda [name], neuro, saw her 11/24/17 and recommended d/c of abilify as first step.  D/c'd abilify and pt got complete recovery.  . Oropharyngeal dysphagia    swallowing study speech path 05/2020. Gastric bx's showed gastritis, h pylori NEG  . OSA on CPAP    prior to move to [place]--had another sleep study 10/2015 w/pulm Linda [name].  . Osteoarthritis    "severe; progressing fast" (07/12/2013); multiple joints-not surgical candidate for TKR (03/2015).  Triamcinolon knee injections by Linda [name]  12/2017.  [name] Kitchen Pernicious anemia 08/24/2014  . Pleural plaque with presence of asbestos 07/22/2013  . Pulmonary embolism ([place]) 07/2013   Dx at [facility] with very small peripheral upper lobe pe 07/2013: pt took coumadin for about 8-9 mo  . Pyelonephritis    "several times over the last 30 yr" (07/12/2013)  . RBBB (right bundle branch block)   . Recurrent major depression ([place])   . Recurrent UTI    hx of hospitalization for pyelonephritis; started abx prophylaxis 06/2015  . Syncope    Hypotensive; ED visit--Linda [name] did Cath--nonobstructive CAD, EF 55-60%.  In retrospect, suspect pt rx med misuse/polypharmacy   Past Surgical History:  Procedure Laterality Date  . APPENDECTOMY  1960  . AXILLARY SURGERY Left 1978   Multiple "lump" in armpit per pt  . BIOPSY  06/17/2020   Gastric bx->gastritis, h pylori neg.  Procedure: BIOPSY;  Surgeon: Linda Ada, MD;  Location: [facility];  Service: Endoscopy;;  . CARDIAC CATHETERIZATION  01/2013   nonobstructive CAD, EF 55-60%  . CARDIOVASCULAR STRESS TEST  02/22/15   Low risk myocard perf imaging; wall motion normal, normal EF  . carotid duplex doppler  10/21/2017   R vertebral flow suggestive of possible distal obstruction.  Pt declined further w/u as of 10/29/17 but need to revisit this problem periodically.  . COCCYX REMOVAL  1972  . DEXA  06/05/2017   T-score -3.1  . DILATION AND CURETTAGE OF UTERUS  ? 1970's  . ESOPHAGOGASTRODUODENOSCOPY N/A 07/19/2014   Gastritis found + in the setting of supratherapeutic INR, +plavix, + meloxicam.  . ESOPHAGOGASTRODUODENOSCOPY (EGD) WITH PROPOFOL N/A 06/17/2020   NO stricture or other prob to explain pt's dysphagia, dilation was done anyway.  Gastric bx's-->gastritis, h pylori neg. Procedure: ESOPHAGOGASTRODUODENOSCOPY (EGD) WITH PROPOFOL;  Surgeon: Linda Ada, MD;  Location: [facility];  Service: Endoscopy;  Laterality: N/A;  . EYE SURGERY Left 2012-2013   "injections for ~ 1 yr; don't really know what for"  (07/12/2013)  . HEEL SPUR SURGERY Left 2008  . KNEE SURGERY  2005  . LEFT HEART CATHETERIZATION WITH CORONARY ANGIOGRAM N/A 01/30/2013   Procedure: LEFT HEART CATHETERIZATION WITH CORONARY ANGIOGRAM;  Surgeon: Clent Demark, MD;  Location: [facility] CATH LAB;  Service: Cardiovascular;  Laterality: N/A;  . MALONEY DILATION  06/17/2020   Procedure: Venia Minks DILATION;  Surgeon: Linda Ada, MD;  Location: [facility];  Service: Endoscopy;;  . PLANTAR FASCIA RELEASE Left 2008  . SPIROMETRY  04/25/14   In hosp for acute asthma/COPD flare: mixed obstructive and restrictive lung disease. The FEV1 is severely reduced at 45% predicted.  FEV1 signif decreased compared to prior spirometry 07/23/13.  . TENDON RELEASE  1996   Right forearm and hand  . TOTAL ABDOMINAL HYSTERECTOMY  1974  . TRANSTHORACIC ECHOCARDIOGRAM  01/2013; 04/2014;08/2015; 09/2017   2014--NORMAL.  2015--focal basal septal hypertrophy, EF 55-60%, grade I diast dysfxn, mild LAE.  08/2015 EF 55-60%,  nl LV syst fxn, grade I DD, valves wnl. 10/21/17: EF 65-70%, grd I DD, o/w normal.    Social History:  reports that she has never smoked. She has never used smokeless tobacco. She reports that she does not drink alcohol and does not use drugs.   Allergies  Allergen Reactions  . Abilify [Aripiprazole] Other (See Comments)    parkinsonism  . Penicillins Itching, Swelling and Rash    Tolerated Cefepime in ED. Has patient had a PCN reaction causing immediate rash, facial/tongue/throat swelling, SOB or lightheadedness with hypotension: Yes Has patient had a PCN reaction causing severe rash involving mucus membranes or skin necrosis: No Has patient had a PCN reaction that required hospitalization: No  Has patient had a PCN reaction occurring within the last 10 years: No     Family History  Problem Relation Age of Onset  . Arthritis Mother   . Kidney disease Mother   . Heart disease Father   . Stroke Father   . Hypertension Father   . Diabetes  Father   . Heart attack Father   . Heart attack Paternal Grandmother   . Diabetes Sister        one sister  . Hypertension Sister   . Asthma Sister   . Hypertension Brother   . Asthma Brother   . Asthma Daughter   . Multiple sclerosis Son       Prior to Admission medications   Medication Sig Start Date End Date Taking? Authorizing Provider  albuterol (VENTOLIN HFA) 108 (90 Base) MCG/ACT inhaler Inhale 1-2 puffs into the lungs every 6 (six) hours as needed for wheezing or shortness of breath. 12/06/19   McGowen, Adrian Blackwater, MD  alendronate (FOSAMAX) 70 MG tablet Take 1 tablet (70 mg total) by mouth every 7 (seven) days. Take with a full glass of water on an empty stomach, first thing in the morning and remain up right for 30 minutes after taking. 07/26/19   McGowen, Adrian Blackwater, MD  ALPRAZolam (XANAX) 1 MG tablet TAKE 1 TABLET THREE TIMES DAILY AS NEEDED FOR ANXIETY. 09/25/20   McGowen, Adrian Blackwater, MD  aspirin 81 MG tablet Take 81 mg by mouth at bedtime.    [provider]  ciprofloxacin (CIPRO) 500 MG tablet Take 500 mg by mouth 2 (two) times daily.    [provider]  cyclobenzaprine (FLEXERIL) 5 MG tablet TAKE 1 TABLET THREE TIMES DAILY AS NEEDED FOR MUSCLE SPASMS. 11/09/20   Meredith Staggers, MD  docusate sodium (COLACE) 100 MG capsule Take 1 capsule (100 mg total) by mouth 2 (two) times daily. 06/20/20   Alma Friendly, MD  donepezil (ARICEPT) 10 MG tablet Take 1 tablet (10 mg total) by mouth at bedtime. 01/31/20   McGowen, Adrian Blackwater, MD  doxycycline (VIBRA-TABS) 100 MG tablet Take 1 tablet (100 mg total) by mouth 2 (two) times daily. 10/20/20   Kuneff, Renee A, DO  DULoxetine (CYMBALTA) 30 MG capsule TAKE 1 CAPSULE ONCE DAILY ALONG WITH 60MG  CAPSULE FOR TOTAL 90MG  DAILY. 07/17/20   McGowen, Adrian Blackwater, MD  DULoxetine (CYMBALTA) 60 MG capsule TAKE 1 CAPSULE A DAY TO BE COMBINED WITH 30MG  CAPSULE 07/17/20   McGowen, Adrian Blackwater, MD  EPINEPHrine 0.3 mg/0.3 mL IJ SOAJ injection Inject  0.3 mLs (0.3 mg total) into the muscle as needed for anaphylaxis. 01/22/20   Geradine Girt, DO  fluconazole (DIFLUCAN) 100 MG tablet TAKE (1) TABLET DAILY AS NEEDED FOR THRUSH 11/23/20   McGowen, Arnette Norris  H, MD  fluticasone (FLONASE) 50 MCG/ACT nasal spray Place 2 sprays into both nostrils daily. 09/27/19   McGowen, Adrian Blackwater, MD  furosemide (LASIX) 40 MG tablet Take 1 tablet (40 mg total) by mouth daily as needed for fluid. 10/07/19   Cherylann Ratel A, DO  ipratropium (ATROVENT) 0.03 % nasal spray Place 2 sprays into both nostrils every 12 (twelve) hours. 09/27/19   McGowen, Adrian Blackwater, MD  isosorbide mononitrate (IMDUR) 30 MG 24 hr tablet TAKE 2 TABLETS IN THE MORNING AND 1 TABLET IN THE EVENING. 09/22/20   Lelon Perla, MD  lamoTRIgine (LAMICTAL) 150 MG tablet TAKE 1 TABLET EACH DAY. 07/25/20   McGowen, Adrian Blackwater, MD  metoprolol succinate (TOPROL-XL) 50 MG 24 hr tablet TAKE (1) TABLET DAILY IN THE MORNING. 10/23/20   McGowen, Adrian Blackwater, MD  metroNIDAZOLE (METROGEL) 0.75 % gel Apply 1 application topically at bedtime. 11/06/20   [provider]  Multiple Vitamin (MULTIVITAMIN WITH MINERALS) TABS tablet Take 1 tablet by mouth daily.    [provider]  mupirocin ointment (BACTROBAN) 2 % Apply 1 application topically 2 (two) times daily. Can be ointment or cream- whichever on formulary and cheapest for her 10/20/20   Raoul Pitch, Renee A, DO  nitroGLYCERIN (NITROSTAT) 0.4 MG SL tablet Place 1 tablet (0.4 mg total) under the tongue every 5 (five) minutes as needed for chest pain (x 3 doses). 09/23/19   Lelon Perla, MD  nystatin (MYCOSTATIN) 100000 UNIT/ML suspension     [provider]  ondansetron (ZOFRAN) 8 MG tablet Take 1 tablet (8 mg total) by mouth every 8 (eight) hours as needed for nausea or vomiting. 09/29/20   McGowen, Adrian Blackwater, MD  oxyCODONE-acetaminophen (PERCOCET) 10-325 MG tablet TAKE (1) TABLET EVERY SIX HOURS AS NEEDED FOR PAIN. 11/01/20   Meredith Staggers, MD    pantoprazole (PROTONIX) 40 MG tablet TAKE 1 TABLET BY MOUTH TWICE DAILY. 11/09/20   McGowen, Adrian Blackwater, MD  predniSONE (DELTASONE) 20 MG tablet Take 20 mg by mouth 2 (two) times daily with a meal. For 2 days    [provider]  rosuvastatin (CRESTOR) 20 MG tablet TAKE 1 TABLET ONCE DAILY. 09/25/20   Lelon Perla, MD  solifenacin (VESICARE) 10 MG tablet TAKE 1 TABLET ONCE DAILY. 11/23/20   McGowen, Adrian Blackwater, MD  sulfamethoxazole-trimethoprim (BACTRIM DS) 800-160 MG tablet Take 1 tablet by mouth 2 (two) times daily. For 5 days    [provider]  traZODone (DESYREL) 50 MG tablet TAKE 2-4 TABLETS AT BEDTIME 09/04/20   Linda Sou, MD    Physical Exam: BP (!) 146/57 (BP Location: Left Arm)   Pulse 66   Temp 98.2 F (36.8 C)   Resp 17   Ht 5\' 4"  (1.626 m)   Wt 63.5 kg   SpO2 96%   BMI 24.03 kg/m   . General: 74 y.o. year-old female well developed well nourished in no acute distress.  Alert and oriented x3. . Cardiovascular: Regular rate and rhythm with no rubs or gallops.  No thyromegaly or JVD noted.  No lower extremity edema. 2/4 pulses in all 4 extremities. [name] Kitchen Respiratory: Clear to auscultation with no wheezes or rales. Good inspiratory effort. . Abdomen: Soft nontender nondistended with normal bowel sounds x4 quadrants. . Muskuloskeletal: No cyanosis, clubbing or edema noted bilaterally . Neuro: CN II-XII intact, strength, sensation, reflexes . Skin: No ulcerative lesions noted or rashes . Psychiatry: Judgement and insight appear normal. Mood is appropriate for  condition and setting          Labs on Admission:  Basic Metabolic Panel: Recent Labs  Lab 11/24/20 1749 11/24/20 1758  NA 139 143  K 3.4* 3.7  CL 103  --   CO2 29  --   GLUCOSE 91  --   BUN 10  --   CREATININE 0.71  --   CALCIUM 8.5*  --    Liver Function Tests: Recent Labs  Lab 11/24/20 1749  AST 18  ALT 13  ALKPHOS 58  BILITOT 0.3  PROT 6.6  ALBUMIN 3.7   No results for  input(s): LIPASE, AMYLASE in the last 168 hours. No results for input(s): AMMONIA in the last 168 hours. CBC: Recent Labs  Lab 11/24/20 1749 11/24/20 1758  WBC 7.7  --   NEUTROABS 4.6  --   HGB 14.0 12.2  HCT 43.6 36.0  MCV 90.5  --   PLT 197  --    Cardiac Enzymes: No results for input(s): CKTOTAL, CKMB, CKMBINDEX, TROPONINI in the last 168 hours.  BNP (last 3 results) Recent Labs    12/06/19 1625 11/24/20 1750  BNP 26.8 70.2    ProBNP (last 3 results) No results for input(s): PROBNP in the last 8760 hours.  CBG: No results for input(s): GLUCAP in the last 168 hours.  Radiological Exams on Admission: DG Chest 1 View  Result Date: 11/24/2020 CLINICAL DATA:  Pain status post fall EXAM: CHEST  1 VIEW COMPARISON:  June 15, 2020 FINDINGS: Calcified pleural based plaques are again noted bilaterally. There is no pneumothorax. No definite focal infiltrate. The heart size is stable. Aortic calcifications are noted. There is no definite acute osseous abnormality. IMPRESSION: No active disease. Electronically Signed   By: Constance Holster M.D.   On: 11/24/2020 19:15   DG Knee 2 Views Left  Result Date: 11/24/2020 CLINICAL DATA:  Pain EXAM: LEFT KNEE - 1-2 VIEW COMPARISON:  None recent FINDINGS: There is a moderate suprapatellar joint effusion. There are advanced tricompartmental degenerative changes. There is no acute displaced fracture or dislocation. IMPRESSION: 1. No acute displaced fracture or dislocation. 2. Moderate suprapatellar joint effusion. 3. Advanced tricompartmental degenerative changes. Electronically Signed   By: Constance Holster M.D.   On: 11/24/2020 19:37   DG Knee 2 Views Right  Result Date: 11/24/2020 CLINICAL DATA:  Pain EXAM: RIGHT KNEE - 1-2 VIEW COMPARISON:  None. FINDINGS: There is a small suprapatellar joint effusion. There are moderate tricompartmental degenerative changes. There is no acute displaced fracture. No dislocation. IMPRESSION: Small  suprapatellar joint effusion. Moderate tricompartmental degenerative changes. No acute displaced fracture or dislocation. Electronically Signed   By: Constance Holster M.D.   On: 11/24/2020 19:37   CT Head Wo Contrast  Result Date: 11/24/2020 CLINICAL DATA:  A status changes. EXAM: CT HEAD WITHOUT CONTRAST TECHNIQUE: Contiguous axial images were obtained from the base of the skull through the vertex without intravenous contrast. COMPARISON:  01/12/2020 FINDINGS: Brain: There is no evidence for acute hemorrhage, hydrocephalus, mass lesion, or abnormal extra-axial fluid collection. No definite CT evidence for acute infarction. Diffuse loss of parenchymal volume is consistent with atrophy. Patchy low attenuation in the deep hemispheric and periventricular white matter is nonspecific, but likely reflects chronic microvascular ischemic demyelination. Small lacunar infarcts noted basal ganglia bilaterally. Vascular: No hyperdense vessel or unexpected calcification. Skull: No evidence for fracture. No worrisome lytic or sclerotic lesion. Sinuses/Orbits: The visualized paranasal sinuses and mastoid air cells are clear. Visualized portions of the  globes and intraorbital fat are unremarkable. Other: None. IMPRESSION: 1. No acute intracranial abnormality. 2. Atrophy with chronic small vessel white matter ischemic disease. Electronically Signed   By: Misty Stanley M.D.   On: 11/24/2020 18:59   DG HIPS BILAT WITH PELVIS MIN 5 VIEWS  Result Date: 11/24/2020 CLINICAL DATA:  74 year old female with fall and bilateral hip pain. EXAM: DG HIP (WITH OR WITHOUT PELVIS) 5+V BILAT COMPARISON:  Pelvic radiograph dated 10/21/2017. FINDINGS: There is no acute fracture or dislocation. The bones are osteopenic. Mild bilateral hip arthritic changes. The soft tissues are unremarkable. There is large amount of stool throughout the colon. IMPRESSION: No acute fracture or dislocation. Electronically Signed   By: Anner Crete M.D.   On:  11/24/2020 19:16    EKG: I independently viewed the EKG done and my findings are as followed: Sinus rhythm rate of 69.  Nonspecific ST-T changes.  QTC 498.  Assessment/Plan Present on Admission: . Acute encephalopathy  Active Problems:   Acute encephalopathy  Acute metabolic encephalopathy, resolved, likely secondary to polypharmacy Appears to be back to her baseline mentation She is on high doses of narcotics and benzodiazepines Cutdown doses of home pain medication and anxiety medication She was transferred to [facility] from [facility] due to concern for CVA CT head nonacute.  No focal deficits on exam.  No indication for MRI brain at this time.  Somnolence with fall, polypharmacy She attributes her falls x2 to bilateral knee osteoarthritis Pain control with reduced doses of home narcotics PT OT to assess Orthostatic vital signs, gentle IV fluid hydration. Fall precautions  Acute hypercarbic respiratory failure suspect secondary to combined narcotics and benzodiazepine ABG revealed elevated PCO2 with compensated pH Reduced doses of home narcotics and benzodiazepines Advised to follow-up with her primary pain specialist for adjustment of her pain medication  Bilateral knee osteoarthritis/chronic lower back pain She follows with pain clinic Pain control Reports she has had injections to her knees bilaterally every 3 months  Essential hypertension BP stable Resume home regimen Monitor vital signs  Chronic anxiety/depression Resume home regimen  GERD/hyperlipidemia Resume home regimen  Resolved hypokalemia Treat as indicated   DVT prophylaxis: Subcu Lovenox daily  Code Status: DO NOT INTUBATE as stated by the patient herself.  Family Communication: None at bedside.  Disposition Plan: Admit to telemetry medical  Consults called: None.  Admission status: Observation status.   Status is: Observation    Dispo:  Patient From: Home  Planned Disposition:  Home  Expected discharge date: 11/25/20  Medically stable for discharge: Yes   Kayleen Memos MD [facility] Pager [phone]  If 7PM-7AM, please contact night-coverage [email or web address] Password [email or web address]  11/25/2020, 1:09 AM

## 2020-11-25 NOTE — Discharge Summary (Addendum)
Linda [name]:[id number] DOB: 21-Oct-1946 DOA: 11/24/2020  PCP: Tammi Sou, MD  Admit date: 11/24/2020 Discharge date: 11/25/2020  Admitted From: assisted living Disposition:  Assisted living  Recommendations for Outpatient Follow-up:  1. Follow up with PCP in 1 week 2. Please obtain BMP/CBC in one week       Discharge Condition:Stable CODE STATUS:partial  Diet recommendation: Heart Healthy   Brief/Interim Summary: Per HPI: Linda [name] is a 74 y.o. female with medical history significant for bilateral knee osteoarthritis, chronic lower back pain, osteoporosis, chronic anxiety/depression, essential hypertension who presented to [facility] ED due to somnolence x 2 days and recurrent falls x2 on the day of her presentation.  At the time of this visit, patient is alert oriented x4.  She reports that she fell because of bilateral knee pain.  She had been receiving knee injections from her pain specialist.  She denies any neurological symptoms.  She stated she has bone-on-bone knee joint and experiences a lot of pain which has caused her to fall.  Denied any prior history of falls.  Due to concern for possible CVA [facility] EDP requested transfer to [facility] for possible MRI brain.  No focal neurological symptoms on exam and patient appears to be back to her baseline mentation.  Will defer MRI brain for now.  Suspect polypharmacy.  She is on high doses of p.o. narcotic and benzodiazepine, prior to admission. She was admitted for acute metabolic encephalopathy secondary to polypharmacy, which resolved. CT head no acute abnormality. Her medications was decreased in dose. This am she is very appropriate , at her baseline, and no neurologic deficits. Answers all questions appropriately. PT/OT was ordered, but patient declined. She does not even want HH . She reports usually she does fine with walking after getting her injections for her knee. She was instructed to divide her xanax to 0.25mg  bid prn,  and her percocet to be cut in half. She is stable for discharge. Her potassium this am was mildly low at 3.2, which was supplemented prior to discharge.  Discharge Diagnoses:  Active Problems:   Acute encephalopathy    Discharge Instructions  Discharge Instructions    Call MD for:  severe uncontrolled pain   Complete by: As directed    Diet - low sodium heart healthy   Complete by: As directed    Discharge instructions   Complete by: As directed    Please cut your xanax in 4 sections. Take 0.25mg  prn only Cut your pain med in half   Increase activity slowly   Complete by: As directed      Allergies as of 11/25/2020      Reactions   Abilify [aripiprazole] Other (See Comments)   parkinsonism   Penicillins Itching, Swelling, Rash   Tolerated Cefepime in ED. Has patient had a PCN reaction causing immediate rash, facial/tongue/throat swelling, SOB or lightheadedness with hypotension: Yes Has patient had a PCN reaction causing severe rash involving mucus membranes or skin necrosis: No Has patient had a PCN reaction that required hospitalization: No  Has patient had a PCN reaction occurring within the last 10 years: No      Medication List    STOP taking these medications   ALPRAZolam 1 MG tablet Commonly known as: XANAX   ciprofloxacin 500 MG tablet Commonly known as: CIPRO   cyclobenzaprine 5 MG tablet Commonly known as: FLEXERIL   doxycycline 100 MG tablet Commonly known as: VIBRA-TABS   fluconazole 100 MG tablet Commonly known as:  DIFLUCAN   oxyCODONE-acetaminophen 10-325 MG tablet Commonly known as: PERCOCET   sulfamethoxazole-trimethoprim 800-160 MG tablet Commonly known as: BACTRIM DS   traZODone 50 MG tablet Commonly known as: DESYREL     TAKE these medications   albuterol 108 (90 Base) MCG/ACT inhaler Commonly known as: VENTOLIN HFA Inhale 1-2 puffs into the lungs every 6 (six) hours as needed for wheezing or shortness of breath.   alendronate 70  MG tablet Commonly known as: FOSAMAX Take 1 tablet (70 mg total) by mouth every 7 (seven) days. Take with a full glass of water on an empty stomach, first thing in the morning and remain up right for 30 minutes after taking.   aspirin 81 MG tablet Take 81 mg by mouth at bedtime.   docusate sodium 100 MG capsule Commonly known as: COLACE Take 1 capsule (100 mg total) by mouth 2 (two) times daily.   donepezil 10 MG tablet Commonly known as: Aricept Take 1 tablet (10 mg total) by mouth at bedtime.   DULoxetine 30 MG capsule Commonly known as: CYMBALTA TAKE 1 CAPSULE ONCE DAILY ALONG WITH 60MG  CAPSULE FOR TOTAL 90MG  DAILY.   DULoxetine 60 MG capsule Commonly known as: CYMBALTA TAKE 1 CAPSULE A DAY TO BE COMBINED WITH 30MG  CAPSULE   EPINEPHrine 0.3 mg/0.3 mL Soaj injection Commonly known as: EPI-PEN Inject 0.3 mLs (0.3 mg total) into the muscle as needed for anaphylaxis.   fluticasone 50 MCG/ACT nasal spray Commonly known as: FLONASE Place 2 sprays into both nostrils daily.   furosemide 40 MG tablet Commonly known as: LASIX Take 1 tablet (40 mg total) by mouth daily as needed for fluid.   ipratropium 0.03 % nasal spray Commonly known as: ATROVENT Place 2 sprays into both nostrils every 12 (twelve) hours.   isosorbide mononitrate 30 MG 24 hr tablet Commonly known as: IMDUR TAKE 2 TABLETS IN THE MORNING AND 1 TABLET IN THE EVENING.   lamoTRIgine 150 MG tablet Commonly known as: LAMICTAL TAKE 1 TABLET EACH DAY.   metoprolol succinate 25 MG 24 hr tablet Commonly known as: TOPROL-XL Take with or immediately following a meal. What changed:   medication strength  See the new instructions.   metroNIDAZOLE 0.75 % gel Commonly known as: METROGEL Apply 1 application topically at bedtime.   multivitamin with minerals Tabs tablet Take 1 tablet by mouth daily.   mupirocin ointment 2 % Commonly known as: BACTROBAN Apply 1 application topically 2 (two) times daily. Can be  ointment or cream- whichever on formulary and cheapest for her   nitroGLYCERIN 0.4 MG SL tablet Commonly known as: NITROSTAT Place 1 tablet (0.4 mg total) under the tongue every 5 (five) minutes as needed for chest pain (x 3 doses).   nystatin 100000 UNIT/ML suspension Commonly known as: MYCOSTATIN   ondansetron 8 MG tablet Commonly known as: Zofran Take 1 tablet (8 mg total) by mouth every 8 (eight) hours as needed for nausea or vomiting.   pantoprazole 40 MG tablet Commonly known as: PROTONIX TAKE 1 TABLET BY MOUTH TWICE DAILY.   predniSONE 20 MG tablet Commonly known as: DELTASONE Take 20 mg by mouth 2 (two) times daily with a meal. For 2 days   rosuvastatin 20 MG tablet Commonly known as: CRESTOR TAKE 1 TABLET ONCE DAILY.   solifenacin 10 MG tablet Commonly known as: VESICARE TAKE 1 TABLET ONCE DAILY.       Follow-up Information    McGowen, Adrian Blackwater, MD Follow up in 1 week(s).  Specialty: Family Medicine Contact information: [address] [place] [address] [phone]              Allergies  Allergen Reactions  . Abilify [Aripiprazole] Other (See Comments)    parkinsonism  . Penicillins Itching, Swelling and Rash    Tolerated Cefepime in ED. Has patient had a PCN reaction causing immediate rash, facial/tongue/throat swelling, SOB or lightheadedness with hypotension: Yes Has patient had a PCN reaction causing severe rash involving mucus membranes or skin necrosis: No Has patient had a PCN reaction that required hospitalization: No  Has patient had a PCN reaction occurring within the last 10 years: No     Consultations:     Procedures/Studies: DG Chest 1 View  Result Date: 11/24/2020 CLINICAL DATA:  Pain status post fall EXAM: CHEST  1 VIEW COMPARISON:  June 15, 2020 FINDINGS: Calcified pleural based plaques are again noted bilaterally. There is no pneumothorax. No definite focal infiltrate. The heart size is stable. Aortic  calcifications are noted. There is no definite acute osseous abnormality. IMPRESSION: No active disease. Electronically Signed   By: Constance Holster M.D.   On: 11/24/2020 19:15   DG Knee 2 Views Left  Result Date: 11/24/2020 CLINICAL DATA:  Pain EXAM: LEFT KNEE - 1-2 VIEW COMPARISON:  None recent FINDINGS: There is a moderate suprapatellar joint effusion. There are advanced tricompartmental degenerative changes. There is no acute displaced fracture or dislocation. IMPRESSION: 1. No acute displaced fracture or dislocation. 2. Moderate suprapatellar joint effusion. 3. Advanced tricompartmental degenerative changes. Electronically Signed   By: Constance Holster M.D.   On: 11/24/2020 19:37   DG Knee 2 Views Right  Result Date: 11/24/2020 CLINICAL DATA:  Pain EXAM: RIGHT KNEE - 1-2 VIEW COMPARISON:  None. FINDINGS: There is a small suprapatellar joint effusion. There are moderate tricompartmental degenerative changes. There is no acute displaced fracture. No dislocation. IMPRESSION: Small suprapatellar joint effusion. Moderate tricompartmental degenerative changes. No acute displaced fracture or dislocation. Electronically Signed   By: Constance Holster M.D.   On: 11/24/2020 19:37   CT Head Wo Contrast  Result Date: 11/24/2020 CLINICAL DATA:  A status changes. EXAM: CT HEAD WITHOUT CONTRAST TECHNIQUE: Contiguous axial images were obtained from the base of the skull through the vertex without intravenous contrast. COMPARISON:  01/12/2020 FINDINGS: Brain: There is no evidence for acute hemorrhage, hydrocephalus, mass lesion, or abnormal extra-axial fluid collection. No definite CT evidence for acute infarction. Diffuse loss of parenchymal volume is consistent with atrophy. Patchy low attenuation in the deep hemispheric and periventricular white matter is nonspecific, but likely reflects chronic microvascular ischemic demyelination. Small lacunar infarcts noted basal ganglia bilaterally. Vascular: No  hyperdense vessel or unexpected calcification. Skull: No evidence for fracture. No worrisome lytic or sclerotic lesion. Sinuses/Orbits: The visualized paranasal sinuses and mastoid air cells are clear. Visualized portions of the globes and intraorbital fat are unremarkable. Other: None. IMPRESSION: 1. No acute intracranial abnormality. 2. Atrophy with chronic small vessel white matter ischemic disease. Electronically Signed   By: Misty Stanley M.D.   On: 11/24/2020 18:59   DG HIPS BILAT WITH PELVIS MIN 5 VIEWS  Result Date: 11/24/2020 CLINICAL DATA:  74 year old female with fall and bilateral hip pain. EXAM: DG HIP (WITH OR WITHOUT PELVIS) 5+V BILAT COMPARISON:  Pelvic radiograph dated 10/21/2017. FINDINGS: There is no acute fracture or dislocation. The bones are osteopenic. Mild bilateral hip arthritic changes. The soft tissues are unremarkable. There is large amount of stool throughout the colon.  IMPRESSION: No acute fracture or dislocation. Electronically Signed   By: Anner Crete M.D.   On: 11/24/2020 19:16       Subjective: Has no complaints.   Discharge Exam: Vitals:   11/25/20 0457 11/25/20 1003  BP: 130/63 132/66  Pulse: 63 63  Resp: 17 18  Temp: 98.2 F (36.8 C) 98.4 F (36.9 C)  SpO2: 93% 94%   Vitals:   11/24/20 2130 11/24/20 2258 11/25/20 0457 11/25/20 1003  BP:  (!) 146/57 130/63 132/66  Pulse:  66 63 63  Resp: 16 17 17 18   Temp:  98.2 F (36.8 C) 98.2 F (36.8 C) 98.4 F (36.9 C)  TempSrc:    Oral  SpO2:  96% 93% 94%  Weight:      Height:        General: Pt is alert, awake, not in acute distress Cardiovascular: RRR, S1/S2 +, no rubs, no gallops Respiratory: CTA bilaterally, no wheezing, no rhonchi Abdominal: Soft, NT, ND, bowel sounds + Extremities: no edema, no cyanosis Neuro: aaxox3, good strength x4, no focal deficits.    The results of significant diagnostics from this hospitalization (including imaging, microbiology, ancillary and laboratory) are  listed below for reference.     Microbiology: Recent Results (from the past 240 hour(s))  Resp Panel by RT-PCR (Flu A&B, Covid) Nasopharyngeal Swab     Status: None   Collection Time: 11/24/20  5:49 PM   Specimen: Nasopharyngeal Swab; Nasopharyngeal(NP) swabs in vial transport medium  Result Value Ref Range Status   SARS Coronavirus 2 by RT PCR NEGATIVE NEGATIVE Final    Comment: (NOTE) SARS-CoV-2 target nucleic acids are NOT DETECTED.  The SARS-CoV-2 RNA is generally detectable in upper respiratory specimens during the acute phase of infection. The lowest concentration of SARS-CoV-2 viral copies this assay can detect is 138 copies/mL. A negative result does not preclude SARS-Cov-2 infection and should not be used as the sole basis for treatment or other patient management decisions. A negative result may occur with  improper specimen collection/handling, submission of specimen other than nasopharyngeal swab, presence of viral mutation(s) within the areas targeted by this assay, and inadequate number of viral copies(<138 copies/mL). A negative result must be combined with clinical observations, patient history, and epidemiological information. The expected result is Negative.  Fact Sheet for Patients:  EntrepreneurPulse.com.au  Fact Sheet for Healthcare Providers:  IncredibleEmployment.be  This test is no t yet approved or cleared by the [place] FDA and  has been authorized for detection and/or diagnosis of SARS-CoV-2 by FDA under an Emergency Use Authorization (EUA). This EUA will remain  in effect (meaning this test can be used) for the duration of the COVID-19 declaration under Section 564(b)(1) of the Act, 21 U.S.C.section 360bbb-3(b)(1), unless the authorization is terminated  or revoked sooner.       Influenza A by PCR NEGATIVE NEGATIVE Final   Influenza B by PCR NEGATIVE NEGATIVE Final    Comment: (NOTE) The Xpert Xpress  SARS-CoV-2/FLU/RSV plus assay is intended as an aid in the diagnosis of influenza from Nasopharyngeal swab specimens and should not be used as a sole basis for treatment. Nasal washings and aspirates are unacceptable for Xpert Xpress SARS-CoV-2/FLU/RSV testing.  Fact Sheet for Patients: EntrepreneurPulse.com.au  Fact Sheet for Healthcare Providers: IncredibleEmployment.be  This test is not yet approved or cleared by the [place] FDA and has been authorized for detection and/or diagnosis of SARS-CoV-2 by FDA under an Emergency Use Authorization (EUA). This EUA will remain in  effect (meaning this test can be used) for the duration of the COVID-19 declaration under Section 564(b)(1) of the Act, 21 U.S.C. section 360bbb-3(b)(1), unless the authorization is terminated or revoked.  Performed at [facility], [address] [place], [place] [address]      Labs: BNP (last 3 results) Recent Labs    12/06/19 1625 11/24/20 1750  BNP 26.8 02.5   Basic Metabolic Panel: Recent Labs  Lab 11/24/20 1749 11/24/20 1758 11/25/20 0217  NA 139 143 141  K 3.4* 3.7 3.2*  CL 103  --  103  CO2 29  --  25  GLUCOSE 91  --  90  BUN 10  --  6*  CREATININE 0.71  --  0.65  CALCIUM 8.5*  --  9.1  MG  --   --  1.9   Liver Function Tests: Recent Labs  Lab 11/24/20 1749  AST 18  ALT 13  ALKPHOS 58  BILITOT 0.3  PROT 6.6  ALBUMIN 3.7   No results for input(s): LIPASE, AMYLASE in the last 168 hours. No results for input(s): AMMONIA in the last 168 hours. CBC: Recent Labs  Lab 11/24/20 1749 11/24/20 1758 11/25/20 0217  WBC 7.7  --  7.2  NEUTROABS 4.6  --   --   HGB 14.0 12.2 13.3  HCT 43.6 36.0 41.5  MCV 90.5  --  89.4  PLT 197  --  184   Cardiac Enzymes: No results for input(s): CKTOTAL, CKMB, CKMBINDEX, TROPONINI in the last 168 hours. BNP: Invalid input(s): POCBNP CBG: No results for input(s): GLUCAP in the last 168  hours. D-Dimer No results for input(s): DDIMER in the last 72 hours. Hgb A1c No results for input(s): HGBA1C in the last 72 hours. Lipid Profile No results for input(s): CHOL, HDL, LDLCALC, TRIG, CHOLHDL, LDLDIRECT in the last 72 hours. Thyroid function studies No results for input(s): TSH, T4TOTAL, T3FREE, THYROIDAB in the last 72 hours.  Invalid input(s): FREET3 Anemia work up No results for input(s): VITAMINB12, FOLATE, FERRITIN, TIBC, IRON, RETICCTPCT in the last 72 hours. Urinalysis    Component Value Date/Time   COLORURINE YELLOW 11/24/2020 2115   APPEARANCEUR CLOUDY (A) 11/24/2020 2115   LABSPEC 1.015 11/24/2020 2115   PHURINE 7.5 11/24/2020 2115   GLUCOSEU NEGATIVE 11/24/2020 2115   HGBUR NEGATIVE 11/24/2020 2115   BILIRUBINUR NEGATIVE 11/24/2020 2115   BILIRUBINUR Negative 09/29/2020 1344   KETONESUR NEGATIVE 11/24/2020 2115   PROTEINUR NEGATIVE 11/24/2020 2115   UROBILINOGEN 1.0 09/29/2020 1344   UROBILINOGEN 1.0 10/16/2015 2009   NITRITE NEGATIVE 11/24/2020 2115   LEUKOCYTESUR SMALL (A) 11/24/2020 2115   Sepsis Labs Invalid input(s): PROCALCITONIN,  WBC,  LACTICIDVEN Microbiology Recent Results (from the past 240 hour(s))  Resp Panel by RT-PCR (Flu A&B, Covid) Nasopharyngeal Swab     Status: None   Collection Time: 11/24/20  5:49 PM   Specimen: Nasopharyngeal Swab; Nasopharyngeal(NP) swabs in vial transport medium  Result Value Ref Range Status   SARS Coronavirus 2 by RT PCR NEGATIVE NEGATIVE Final    Comment: (NOTE) SARS-CoV-2 target nucleic acids are NOT DETECTED.  The SARS-CoV-2 RNA is generally detectable in upper respiratory specimens during the acute phase of infection. The lowest concentration of SARS-CoV-2 viral copies this assay can detect is 138 copies/mL. A negative result does not preclude SARS-Cov-2 infection and should not be used as the sole basis for treatment or other patient management decisions. A negative result may occur with  improper  specimen  collection/handling, submission of specimen other than nasopharyngeal swab, presence of viral mutation(s) within the areas targeted by this assay, and inadequate number of viral copies(<138 copies/mL). A negative result must be combined with clinical observations, patient history, and epidemiological information. The expected result is Negative.  Fact Sheet for Patients:  EntrepreneurPulse.com.au  Fact Sheet for Healthcare Providers:  IncredibleEmployment.be  This test is no t yet approved or cleared by the [place] FDA and  has been authorized for detection and/or diagnosis of SARS-CoV-2 by FDA under an Emergency Use Authorization (EUA). This EUA will remain  in effect (meaning this test can be used) for the duration of the COVID-19 declaration under Section 564(b)(1) of the Act, 21 U.S.C.section 360bbb-3(b)(1), unless the authorization is terminated  or revoked sooner.       Influenza A by PCR NEGATIVE NEGATIVE Final   Influenza B by PCR NEGATIVE NEGATIVE Final    Comment: (NOTE) The Xpert Xpress SARS-CoV-2/FLU/RSV plus assay is intended as an aid in the diagnosis of influenza from Nasopharyngeal swab specimens and should not be used as a sole basis for treatment. Nasal washings and aspirates are unacceptable for Xpert Xpress SARS-CoV-2/FLU/RSV testing.  Fact Sheet for Patients: EntrepreneurPulse.com.au  Fact Sheet for Healthcare Providers: IncredibleEmployment.be  This test is not yet approved or cleared by the [place] FDA and has been authorized for detection and/or diagnosis of SARS-CoV-2 by FDA under an Emergency Use Authorization (EUA). This EUA will remain in effect (meaning this test can be used) for the duration of the COVID-19 declaration under Section 564(b)(1) of the Act, 21 U.S.C. section 360bbb-3(b)(1), unless the authorization is terminated or revoked.  Performed at  [facility], [address] [place], [place] [address]      Time coordinating discharge: Over 30 minutes  SIGNED:   Nolberto Hanlon, MD  [facility] 11/25/2020, 11:06 AM Pager   If 7PM-7AM, please contact night-coverage [email or web address] Password [email or web address]

## 2020-11-27 ENCOUNTER — Other Ambulatory Visit: Payer: Self-pay | Admitting: *Deleted

## 2020-11-27 ENCOUNTER — Other Ambulatory Visit: Payer: Self-pay | Admitting: Family Medicine

## 2020-11-27 LAB — URINE CULTURE: Culture: >=100000 — AB

## 2020-11-27 NOTE — Telephone Encounter (Signed)
Pt is completely out of these meds   Requesting: xanax, lamictal Contract:09/29/20 UDS:07/24/20 Last Visit:09/29/20 Next Visit:12/28/20 Last Refill:09/22/20 (90,0) 07/25/20 (30,3)  Please Advise, medication pending

## 2020-11-27 NOTE — Patient Outreach (Signed)
[facility]) Care Management  11/27/2020  Linda [name] 02-05-1946 [id number]   Telephone outreach for post ED/1 day hospitalization for somnolence and falls.  [name] was previously involved with [facility] last summer. She resides at [facility].   She reports today that she is feeling much better. She is wearing her O2 all the time now as her oxygen level was in the 80's when she went to the hospital.  Several of her medications doses were changed as it was thought she was overmedicated (Xanax and Percocet doses cut 1/2). She was given potassium while she was in the hospital as her level was slightly low.  She reports she is able to eat pretty much what she wants to eat she just has to be very careful to choose softer foods or to chew foods well so she can swallow easily.  She reports she has been generally well over the last 5 months and we agreed that she is well supervised in her living setting and does not need [facility] services at this time.  Linda Pont. Linda Neither, MSN, [facility] Gerontological Nurse Practitioner [facility] Care Management [phone]

## 2020-11-28 ENCOUNTER — Telehealth: Payer: Self-pay

## 2020-11-28 NOTE — Telephone Encounter (Signed)
Transition Care Management Follow-up Telephone Call  Date of discharge and from where: 11/25/20-[facility]  How have you been since you were released from the hospital?ok-using oxygen  Any questions or concerns? No  Items Reviewed:  Did the pt receive and understand the discharge instructions provided? Yes   Medications obtained and verified? Yes   Other? Yes   Any new allergies since your discharge? No   Dietary orders reviewed? Yes  Do you have support at home? Yes   Home Care and Equipment/Supplies: Were home health services ordered? no If so, what is the name of the agency? n/a  Has the agency set up a time to come to the patient's home? not applicable Were any new equipment or medical supplies ordered?  No What is the name of the medical supply agency? n/a Were you able to get the supplies/equipment? not applicable Do you have any questions related to the use of the equipment or supplies? No  Functional Questionnaire: (I = Independent and D = Dependent) ADLs: I  Bathing/Dressing- I  Meal Prep- i  Eating- i  Maintaining continence- i  Transferring/Ambulation- i  Managing Meds- i  Follow up appointments reviewed:   PCP Hospital f/u appt confirmed? Yes  Scheduled to see [name] on 12/01/20 @ 1:00.  [facility] f/u appt confirmed? N/A   Are transportation arrangements needed? No   If their condition worsens, is the pt aware to call PCP or go to the Emergency Dept.? Yes  Was the patient provided with contact information for the PCP's office or ED? Yes  Was to pt encouraged to call back with questions or concerns? Yes

## 2020-11-28 NOTE — Telephone Encounter (Signed)
Noted: nurse phone contact with patient for TCM. Signed:  Crissie Sickles, MD           11/28/2020

## 2020-11-29 ENCOUNTER — Telehealth: Payer: Self-pay | Admitting: Family Medicine

## 2020-11-29 NOTE — Telephone Encounter (Signed)
Patient states that when she spoke with you on 11/27/20, she was told there would be an antibiotic called in to [facility] delivery for her UTI. Pharmacy told her they have not received a prescription. Please advise.

## 2020-12-01 ENCOUNTER — Other Ambulatory Visit: Payer: Self-pay | Admitting: Family Medicine

## 2020-12-01 ENCOUNTER — Inpatient Hospital Stay: Payer: Medicare Other | Admitting: Family Medicine

## 2020-12-01 ENCOUNTER — Other Ambulatory Visit: Payer: Self-pay

## 2020-12-01 MED ORDER — CEPHALEXIN 500 MG PO CAPS: 500.0000 mg | ORAL_CAPSULE | Freq: Three times a day (TID) | ORAL | 0 refills | Status: AC

## 2020-12-01 NOTE — Progress Notes (Deleted)
12/01/2020  CC: No chief complaint on file.   Patient is a 74 y.o. Caucasian female who presents for  hospital follow up, specifically [facility] face-to-face visit. Dates hospitalized: 12/3-12/4, 2021. Days since d/c from hospital: 6 days Patient was discharged from hospital to "home" (assisted living). Reason for admission to hospital: somnolence and falls. Date of interactive (phone) contact with patient and/or caregiver:11/28/20.  I have reviewed patient's discharge summary plus pertinent specific notes, labs, and imaging from the hospitalization.   Question of pt having inc pain s/p knee injections the couple days prior to her admission vs polypharmacy/oversedation from alpraz and opioids. She mentally cleared in hosp, cva w/u was aborted, she was at baseline upon d/c, she declined PT/OT/HH. She was instructed to divide her xanax to 0.25mg  bid prn and cut her percocet in half. Potassium 3.2, supplemented prior to d/c.   {current status of patient, symptoms, etc}  11/24/20 urine clx done in hosp+ Klebsiella, pt was having urinary urgency and frequency so I recommended keflex 500mg  tid x 3d but this inadvertently did not get sent to pharmacy.  Medication reconciliation was done today and patient {is not} {is} taking meds as recommended by discharging hospitalist/specialist.    PMH:  Past Medical History:  Diagnosis Date  . Acute upper GI bleed 06/2014   while pt taking coumadin, plavix, and meloxicam---despite being told not to take coumadin.  . Anginal pain ([place])    Nonobstructive CAD 2014; however, her cardiologist put her on a statin for this and NOT for hyperlipidemia per pt report.  Atyp CP 08/2017 at card f/u, plan for myoc perf imaging.  . Anxiety    panic attacks  . Asthma    w/ asbestososis   . BPPV (benign paroxysmal positional vertigo) 12/16/2012  . Chronic diastolic CHF (congestive heart failure) (HCC)    dry wt as of 11/06/16 is 168 lbs.  . Chronic  lower back pain   . COPD (chronic obstructive pulmonary disease) ([place])   . DDD (degenerative disc disease)    lumbar and cervical.   . Diverticular disease   . Fibromyalgia    Patient states dx was around her late 72s but she had sx's for years prior to this.  . H/O hiatal hernia   . History of pneumonia    hospitalized 12/2011, 02/2013, and 07/2013 [facility]) for this  . HTN (hypertension)    Renal artery dopplers 04/2013 neg for stenosis.  . Hypervitaminosis D 09/27/2019   over-supplemented.  Stopped vit D and plan recheck 2 mo.  . Idiopathic angio-edema-urticaria [id number]; 2021   Angioedema component was very minimal.  2021->Dr. Bobbitt (allergist) eval.  . Insomnia   . Iron deficiency anemia    Hematologist in [place], [place] did extensive w/u; no cause found; failed oral supplement;; gets fairly regular (q20m or so) IV iron infusions (Venofer -iron sucrose- 200mg  with procrit.  "for 14 yr I've been getting blood work q month & getting infusions prn" (07/12/2013).  Dr. Marin Olp locally, iron infusions done, EPO deficiency dx'd  . Migraine syndrome    "not as often anymore; used to be ~ q wk" (07/12/2013)  . Mixed incontinence urge and stress   . Nephrolithiasis    "passed all on my own or they are still in there" (07/12/2013)  . Neuroleptic induced parkinsonism ([place]) 2018   Dr. Carles Collet, neuro, saw her 11/24/17 and recommended d/c of abilify as first step.  D/c'd abilify and pt got complete recovery.  . Oropharyngeal dysphagia  swallowing study speech path 05/2020. Gastric bx's showed gastritis, h pylori NEG  . OSA on CPAP    prior to move to [place]--had another sleep study 10/2015 w/pulm Dr. Camillo Flaming.  . Osteoarthritis    "severe; progressing fast" (07/12/2013); multiple joints-not surgical candidate for TKR (03/2015).  Triamcinolon knee injections by Dr. Tessa Lerner 12/2017.  [name] Kitchen Pernicious anemia 08/24/2014  . Pleural plaque with presence of asbestos 07/22/2013  . Pulmonary embolism ([place]) 07/2013   Dx at [facility] with  very small peripheral upper lobe pe 07/2013: pt took coumadin for about 8-9 mo  . Pyelonephritis    "several times over the last 30 yr" (07/12/2013)  . RBBB (right bundle branch block)   . Recurrent major depression ([employer])   . Recurrent UTI    hx of hospitalization for pyelonephritis; started abx prophylaxis 06/2015  . Syncope    Hypotensive; ED visit--Dr. Terrence Dupont did Cath--nonobstructive CAD, EF 55-60%.  In retrospect, suspect pt rx med misuse/polypharmacy    PSH:  Past Surgical History:  Procedure Laterality Date  . APPENDECTOMY  1960  . AXILLARY SURGERY Left 1978   Multiple "lump" in armpit per pt  . BIOPSY  06/17/2020   Gastric bx->gastritis, h pylori neg.  Procedure: BIOPSY;  Surgeon: Carol Ada, MD;  Location: [facility];  Service: Endoscopy;;  . CARDIAC CATHETERIZATION  01/2013   nonobstructive CAD, EF 55-60%  . CARDIOVASCULAR STRESS TEST  02/22/15   Low risk myocard perf imaging; wall motion normal, normal EF  . carotid duplex doppler  10/21/2017   R vertebral flow suggestive of possible distal obstruction.  Pt declined further w/u as of 10/29/17 but need to revisit this problem periodically.  . COCCYX REMOVAL  1972  . DEXA  06/05/2017   T-score -3.1  . DILATION AND CURETTAGE OF UTERUS  ? 1970's  . ESOPHAGOGASTRODUODENOSCOPY N/A 07/19/2014   Gastritis found + in the setting of supratherapeutic INR, +plavix, + meloxicam.  . ESOPHAGOGASTRODUODENOSCOPY (EGD) WITH PROPOFOL N/A 06/17/2020   NO stricture or other prob to explain pt's dysphagia, dilation was done anyway.  Gastric bx's-->gastritis, h pylori neg. Procedure: ESOPHAGOGASTRODUODENOSCOPY (EGD) WITH PROPOFOL;  Surgeon: Carol Ada, MD;  Location: [facility];  Service: Endoscopy;  Laterality: N/A;  . EYE SURGERY Left 2012-2013   "injections for ~ 1 yr; don't really know what for" (07/12/2013)  . HEEL SPUR SURGERY Left 2008  . KNEE SURGERY  2005  . LEFT HEART CATHETERIZATION WITH CORONARY ANGIOGRAM N/A 01/30/2013    Procedure: LEFT HEART CATHETERIZATION WITH CORONARY ANGIOGRAM;  Surgeon: Clent Demark, MD;  Location: [facility] CATH LAB;  Service: Cardiovascular;  Laterality: N/A;  . MALONEY DILATION  06/17/2020   Procedure: Venia Minks DILATION;  Surgeon: Carol Ada, MD;  Location: [facility];  Service: Endoscopy;;  . PLANTAR FASCIA RELEASE Left 2008  . SPIROMETRY  04/25/14   In hosp for acute asthma/COPD flare: mixed obstructive and restrictive lung disease. The FEV1 is severely reduced at 45% predicted.  FEV1 signif decreased compared to prior spirometry 07/23/13.  . TENDON RELEASE  1996   Right forearm and hand  . TOTAL ABDOMINAL HYSTERECTOMY  1974  . TRANSTHORACIC ECHOCARDIOGRAM  01/2013; 04/2014;08/2015; 09/2017   2014--NORMAL.  2015--focal basal septal hypertrophy, EF 55-60%, grade I diast dysfxn, mild LAE.  08/2015 EF 55-60%, nl LV syst fxn, grade I DD, valves wnl. 10/21/17: EF 65-70%, grd I DD, o/w normal.    MEDS:  Outpatient Medications Prior to Visit  Medication Sig Dispense Refill  . albuterol (VENTOLIN HFA) 108 (  90 Base) MCG/ACT inhaler Inhale 1-2 puffs into the lungs every 6 (six) hours as needed for wheezing or shortness of breath. 18 g 1  . alendronate (FOSAMAX) 70 MG tablet Take 1 tablet (70 mg total) by mouth every 7 (seven) days. Take with a full glass of water on an empty stomach, first thing in the morning and remain up right for 30 minutes after taking. 12 tablet 3  . ALPRAZolam (XANAX) 1 MG tablet TAKE 1 TABLET THREE TIMES DAILY AS NEEDED FOR ANXIETY. 90 tablet 5  . aspirin 81 MG tablet Take 81 mg by mouth at bedtime.    . docusate sodium (COLACE) 100 MG capsule Take 1 capsule (100 mg total) by mouth 2 (two) times daily. (Patient taking differently: Take 100 mg by mouth daily. ) 10 capsule 0  . donepezil (ARICEPT) 10 MG tablet Take 1 tablet (10 mg total) by mouth at bedtime. 90 tablet 3  . DULoxetine (CYMBALTA) 30 MG capsule TAKE 1 CAPSULE ONCE DAILY ALONG WITH 60MG  CAPSULE FOR TOTAL 90MG  DAILY.  (Patient taking differently: Take 30 mg by mouth See admin instructions. ) 90 capsule 3  . DULoxetine (CYMBALTA) 60 MG capsule TAKE 1 CAPSULE A DAY TO BE COMBINED WITH 30MG  CAPSULE (Patient taking differently: Take 60 mg by mouth See admin instructions. ) 90 capsule 3  . EPINEPHrine 0.3 mg/0.3 mL IJ SOAJ injection Inject 0.3 mLs (0.3 mg total) into the muscle as needed for anaphylaxis. 1 each 1  . fluticasone (FLONASE) 50 MCG/ACT nasal spray Place 2 sprays into both nostrils daily. (Patient taking differently: Place 2 sprays into both nostrils daily as needed for allergies. ) 16 g 11  . furosemide (LASIX) 40 MG tablet Take 1 tablet (40 mg total) by mouth daily as needed for fluid.    [name] Kitchen ipratropium (ATROVENT) 0.03 % nasal spray Place 2 sprays into both nostrils every 12 (twelve) hours. 30 mL 11  . isosorbide mononitrate (IMDUR) 30 MG 24 hr tablet TAKE 2 TABLETS IN THE MORNING AND 1 TABLET IN THE EVENING. (Patient taking differently: Take 30-60 mg by mouth See admin instructions. TAKE 2 TABLETS IN THE MORNING AND 1 TABLET IN THE EVENING.) 270 tablet 1  . lamoTRIgine (LAMICTAL) 150 MG tablet TAKE 1 TABLET EACH DAY. 90 tablet 3  . metoprolol succinate (TOPROL-XL) 25 MG 24 hr tablet Take 0.5 tablets (12.5 mg total) by mouth daily. Take with or immediately following a meal. 30 tablet 0  . metroNIDAZOLE (METROGEL) 0.75 % gel Apply 1 application topically at bedtime.    . Multiple Vitamin (MULTIVITAMIN WITH MINERALS) TABS tablet Take 1 tablet by mouth daily.    . mupirocin ointment (BACTROBAN) 2 % Apply 1 application topically 2 (two) times daily. Can be ointment or cream- whichever on formulary and cheapest for her 22 g 0  . nitroGLYCERIN (NITROSTAT) 0.4 MG SL tablet Place 1 tablet (0.4 mg total) under the tongue every 5 (five) minutes as needed for chest pain (x 3 doses). 25 tablet 11  . ondansetron (ZOFRAN) 8 MG tablet Take 1 tablet (8 mg total) by mouth every 8 (eight) hours as needed for nausea or  vomiting. 30 tablet 6  . pantoprazole (PROTONIX) 40 MG tablet TAKE 1 TABLET BY MOUTH TWICE DAILY. (Patient taking differently: Take 40 mg by mouth daily. ) 180 tablet 0  . rosuvastatin (CRESTOR) 20 MG tablet TAKE 1 TABLET ONCE DAILY. (Patient taking differently: Take 20 mg by mouth daily. ) 30 tablet 1  . solifenacin (  VESICARE) 10 MG tablet TAKE 1 TABLET ONCE DAILY. (Patient taking differently: Take 10 mg by mouth daily. ) 30 tablet 0   No facility-administered medications prior to visit.    Pertinent labs/imaging Lab Results  Component Value Date   TSH 2.750 10/04/2019   Lab Results  Component Value Date   WBC 7.2 11/25/2020   HGB 13.3 11/25/2020   HCT 41.5 11/25/2020   MCV 89.4 11/25/2020   PLT 184 11/25/2020   Lab Results  Component Value Date   CREATININE 0.65 11/25/2020   BUN 6 (L) 11/25/2020   NA 141 11/25/2020   K 3.2 (L) 11/25/2020   CL 103 11/25/2020   CO2 25 11/25/2020   Lab Results  Component Value Date   ALT 13 11/24/2020   AST 18 11/24/2020   ALKPHOS 58 11/24/2020   BILITOT 0.3 11/24/2020   Lab Results  Component Value Date   CHOL 172 09/22/2018   Lab Results  Component Value Date   HDL 43.70 09/22/2018   Lab Results  Component Value Date   LDLCALC 92 09/22/2018   Lab Results  Component Value Date   TRIG 184.0 (H) 09/22/2018   Lab Results  Component Value Date   CHOLHDL 4 09/22/2018   Lab Results  Component Value Date   HGBA1C 5.6 01/02/2016    ASSESSMENT/PLAN:  ***  {Medical decision making of moderate complexity was utilized today} 99495  {Medical decision making of high complexity was utilized today} 38937  FOLLOW UP:  ***  Signed:  Crissie Sickles, MD           12/01/2020

## 2020-12-02 DIAGNOSIS — L259 Unspecified contact dermatitis, unspecified cause: Secondary | ICD-10-CM | POA: Diagnosis not present

## 2020-12-02 DIAGNOSIS — R059 Cough, unspecified: Secondary | ICD-10-CM | POA: Diagnosis not present

## 2020-12-05 ENCOUNTER — Ambulatory Visit: Payer: Medicare Other | Admitting: Podiatry

## 2020-12-08 ENCOUNTER — Ambulatory Visit: Payer: Medicare Other | Admitting: Podiatry

## 2020-12-13 ENCOUNTER — Encounter: Payer: Medicare Other | Attending: Physical Medicine & Rehabilitation | Admitting: Physical Medicine & Rehabilitation

## 2020-12-13 ENCOUNTER — Other Ambulatory Visit: Payer: Self-pay

## 2020-12-13 ENCOUNTER — Encounter: Payer: Self-pay | Admitting: Physical Medicine & Rehabilitation

## 2020-12-13 VITALS — BP 104/63 | HR 79 | Temp 98.0°F | Ht 64.0 in | Wt 141.8 lb

## 2020-12-13 DIAGNOSIS — M1712 Unilateral primary osteoarthritis, left knee: Secondary | ICD-10-CM | POA: Insufficient documentation

## 2020-12-13 DIAGNOSIS — M170 Bilateral primary osteoarthritis of knee: Secondary | ICD-10-CM | POA: Diagnosis not present

## 2020-12-13 DIAGNOSIS — M1711 Unilateral primary osteoarthritis, right knee: Secondary | ICD-10-CM | POA: Diagnosis not present

## 2020-12-13 MED ORDER — OXYCODONE-ACETAMINOPHEN 10-325 MG PO TABS: 1.0000 | ORAL_TABLET | ORAL | 0 refills | Status: DC | PRN

## 2020-12-13 NOTE — Progress Notes (Signed)
PROCEDURE NOTE  DIAGNOSIS:  Bilateral OA of knees, endstage  INTERVENTION:   ZILRETTA INJECTION  After informed consent and preparation of the skin with betadine and isopropyl alcohol, I injected 32MG  of zilretta dissolved into 5cc of diluent into left knee via anterolateral approach. Contents of syring were shaken vigorously and aspiration was performed prior to injection. The patient tolerated well, and no complications were encountered. Afterward the area was cleaned and dressed. Post- injection instructions were provided including ice  if swelling or pain should occur.     INTERVENTION: traditional steroid injection  After informed consent and preparation of the skin with betadine and isopropyl alcohol, I injected 6mg  (1cc) of celestone and 4cc of 1% lidocaine ingo the right knee via anterolateral approach. Additionally, aspiration was performed prior to injection. The patient tolerated well, and no complications were encountered. Afterward the area was cleaned and dressed. Post- injection instructions were provided.        Meredith Staggers, MD, [facility] 12/13/2020

## 2020-12-13 NOTE — Patient Instructions (Signed)
PLEASE FEEL FREE TO CALL OUR OFFICE WITH ANY PROBLEMS OR QUESTIONS ([phone])                                @                 @@               @@@                        @@@@                      @@@@@         @@@@@@                  @@@@@@@                @@@@@@@@              @@@@@@@@@             @@@@@@@@@@       IIII                  IIII                                                        HAPPY HOLIDAYS!!!!!  

## 2020-12-20 ENCOUNTER — Other Ambulatory Visit: Payer: Self-pay | Admitting: Cardiology

## 2020-12-20 ENCOUNTER — Other Ambulatory Visit: Payer: Self-pay | Admitting: Family Medicine

## 2020-12-20 DIAGNOSIS — E7800 Pure hypercholesterolemia, unspecified: Secondary | ICD-10-CM

## 2020-12-20 DIAGNOSIS — I2510 Atherosclerotic heart disease of native coronary artery without angina pectoris: Secondary | ICD-10-CM

## 2020-12-21 ENCOUNTER — Ambulatory Visit (INDEPENDENT_AMBULATORY_CARE_PROVIDER_SITE_OTHER): Payer: Medicare Other | Admitting: Podiatry

## 2020-12-21 ENCOUNTER — Other Ambulatory Visit: Payer: Self-pay

## 2020-12-21 ENCOUNTER — Ambulatory Visit (INDEPENDENT_AMBULATORY_CARE_PROVIDER_SITE_OTHER): Payer: Medicare Other

## 2020-12-21 DIAGNOSIS — M2041 Other hammer toe(s) (acquired), right foot: Secondary | ICD-10-CM | POA: Diagnosis not present

## 2020-12-22 NOTE — Progress Notes (Signed)
  Subjective:  Patient ID: Linda [name], female    DOB: October 20, 1946,  MRN: [id number]  Chief Complaint  Patient presents with  . Foot Pain    PT stated that she is doing well. Her foot is doing better but not well she does still have a little bit of pain.    74 y.o. female returns with the above complaint. History confirmed with patient.  Slightly improved but still hurts mostly.  Objective:  Physical Exam: warm, good capillary refill, no trophic changes or ulcerative lesions, normal DP and PT pulses and normal sensory exam.  Heloma molle kissing lesion fourth interspace right foot.  Semireducible hammertoes 2 through 5 right foot   Radiographs taken which show hammertoe contractures of the right foot Assessment:   1. Hammertoe of right foot      Plan:  Patient was evaluated and treated and all questions answered.  At this point she is not improved with nonsurgical treatment.  She would like to have a permanent solution.  Discussed with her possible options including hammertoe correction, partial syndactylization with excision of the lesion and partial phalangectomy of the base of the fourth toe.  We discussed the risks, benefits and potential complications including but limited to pain, swelling, infection, scar, numbness which may be temporary or permanent, chronic pain, stiffness, nerve pain or damage, wound healing problems or recurrence of the lesion.  She understands and would like to proceed with surgical correction.  All questions were addressed.  Informed consent was signed and reviewed.  Surgery scheduled for late January.  I would like her to see her cardiologist prior to surgery.   Surgical plan:  Procedure: -Hammertoe correction of fifth toe, excision of heloma molle and partial syndactylization fourth interspace, possible partial phalangectomy of proximal phalanx base  Location: -[facility]  Anesthesia plan: -IV sedation with local anesthesia  Postoperative pain  plan: - Tylenol 1000 mg every 6 hours, ibuprofen 600 mg every 6 hours, oxycodone 5 mg 1-2 tabs every 6 hours only as needed  DVT prophylaxis: -None required to be WBAT in surgical shoe  WB Restrictions / DME needs: -WBAT in surgical shoe, this was dispensed in the office today   No follow-ups on file.

## 2020-12-26 ENCOUNTER — Telehealth: Payer: Self-pay | Admitting: *Deleted

## 2020-12-26 NOTE — Telephone Encounter (Signed)
Yes, that's what we said at her last visit so that she would stay in "sync" with both knees for zilretta. She can return at next scheduled visit for the z-shots

## 2020-12-26 NOTE — Telephone Encounter (Signed)
Patient is wanting to know when she is eligible to get zilretta again. She wants to keep both knees on the same schedule. She received a regular steroid shot in the right knee. She is wanting to make it so she can continue to come in and get both knees injected with zilretta at the sametime.

## 2020-12-27 ENCOUNTER — Other Ambulatory Visit: Payer: Self-pay

## 2020-12-27 NOTE — Telephone Encounter (Signed)
Patient rescheduled with Dr. Riley Kill February, 2nd, 2022.

## 2020-12-28 ENCOUNTER — Encounter: Payer: Self-pay | Admitting: Family Medicine

## 2020-12-28 ENCOUNTER — Ambulatory Visit (INDEPENDENT_AMBULATORY_CARE_PROVIDER_SITE_OTHER): Payer: Medicare Other | Admitting: Family Medicine

## 2020-12-28 VITALS — BP 159/81 | HR 82 | Temp 97.7°F | Resp 16 | Ht 64.0 in | Wt 138.0 lb

## 2020-12-28 DIAGNOSIS — N3941 Urge incontinence: Secondary | ICD-10-CM

## 2020-12-28 DIAGNOSIS — R300 Dysuria: Secondary | ICD-10-CM | POA: Diagnosis not present

## 2020-12-28 DIAGNOSIS — J9611 Chronic respiratory failure with hypoxia: Secondary | ICD-10-CM | POA: Diagnosis not present

## 2020-12-28 DIAGNOSIS — J449 Chronic obstructive pulmonary disease, unspecified: Secondary | ICD-10-CM

## 2020-12-28 DIAGNOSIS — J61 Pneumoconiosis due to asbestos and other mineral fibers: Secondary | ICD-10-CM

## 2020-12-28 DIAGNOSIS — R3915 Urgency of urination: Secondary | ICD-10-CM

## 2020-12-28 DIAGNOSIS — E7800 Pure hypercholesterolemia, unspecified: Secondary | ICD-10-CM

## 2020-12-28 DIAGNOSIS — N390 Urinary tract infection, site not specified: Secondary | ICD-10-CM

## 2020-12-28 LAB — LIPID PANEL
Cholesterol: 145 mg/dL (ref 0–200)
HDL: 58.2 mg/dL (ref >39.00)
LDL Cholesterol: 74 mg/dL (ref 0–99)
NonHDL: 86.66
Total CHOL/HDL Ratio: 2
Triglycerides: 65 mg/dL (ref 0.0–149.0)
VLDL: 13 mg/dL (ref 0.0–40.0)

## 2020-12-28 LAB — POCT URINALYSIS DIPSTICK
Glucose, UA: NEGATIVE
Ketones, UA: NEGATIVE
Nitrite, UA: POSITIVE
Protein, UA: POSITIVE — AB
Spec Grav, UA: 1.025 (ref 1.010–1.025)
Urobilinogen, UA: 0.2 E.U./dL
pH, UA: 6 (ref 5.0–8.0)

## 2020-12-28 MED ORDER — MIRABEGRON ER 25 MG PO TB24: 25.0000 mg | ORAL_TABLET | Freq: Every day | ORAL | 0 refills | Status: DC

## 2020-12-28 MED ORDER — EPINEPHRINE 0.3 MG/0.3ML IJ SOAJ: 0.3000 mg | INTRAMUSCULAR | 1 refills | Status: DC | PRN

## 2020-12-28 MED ORDER — CEFDINIR 300 MG PO CAPS: 300.0000 mg | ORAL_CAPSULE | Freq: Two times a day (BID) | ORAL | 0 refills | Status: DC

## 2020-12-28 MED ORDER — ALBUTEROL SULFATE HFA 108 (90 BASE) MCG/ACT IN AERS
1.0000 | INHALATION_SPRAY | Freq: Four times a day (QID) | RESPIRATORY_TRACT | 1 refills | Status: DC | PRN
Start: 2020-12-28 — End: 2023-01-01

## 2020-12-28 MED ORDER — METOPROLOL SUCCINATE ER 100 MG PO TB24: ORAL_TABLET | ORAL | 3 refills | Status: DC

## 2020-12-28 NOTE — Progress Notes (Unsigned)
OFFICE VISIT  12/28/2020  CC:  Chief Complaint  Patient presents with  . Follow-up    RCI, 3 mo. Pt is fasting   HPI:    Patient is a 75 y.o. Caucasian female who presents for 3 mo f/u HTN, MCI, recurrent MDD/GAD, urge incontinence. She has COPD/pulm fibrosis/asbestosis (with hx of chronic hypoxic RF), chronic diastolic HF (also hx of nonobstructive CAD for which she takes statin), and chronic anxiety with treatment resistant depression. She also has chronic pain syndrome, gets fairly regular interventional treatments and chronically takes opioid pain medication (Dr. Ephriam Knuckles). Polypharmacy issues have long been an issue regarding her overall wellbeing and medical care.  A/P as of last visit: "1) Glossitis: unknown cause.  Reassured pt no infection today. Offered referral to ENT but she declined.  2) HTN: The current medical regimen is effective;  continue present plan and medications. BMET today.  3) Urgency/incontinence: acute on chronic. Usually well controlled with vesicare, so we'll contiunue this. UA with +nitrites.  Will send in cipro 500 mg bid x 3d, and send urine for c/s today.  4) Recurrent MDD/GAD/mild cognitive impairment: all stable. No new rx's needed today. CSC updated."  INTERIM HX: Feeling well. Is getting toe surgery with Dr. Sherryle Lis with [facility] in a couple weeks (Right).  BP checks: range 120-165/70-90s. She says it is more consistently toward 150s/90s.  Says breathing feels fine currently.  Says O2 sat drops into 80s on RA sometimes, usually with activity---feels SOB and more drowsy/listless when this occurs. Applies 2-3 L oxygen prn and all sx's resolve and oxygen rises into 90s.  Wears oxygen all night as well. Has seen Dr. Camillo Flaming but says he told her she needs to see someone in [place] (??).  Has chronic urinary urgency and odor to urine.  No persistent burning with urination. Chronic urge incont better in morning time with vesicare 10mg  but  ineffective in afternoon, says this is the only urge incont med she has been on. +Hx recurrent UTI, but a fair amount of times she has had some vague UTI sx's with NEG urine cultures.  Mood and anxiety levels stable, states NO adverse side effects from lamictal, xanax, or duloxetine.  Takes donepezil for mild cognitive impairment but tough to tell if this makes any diff for her.  ROS: no fevers, no CP,no dizziness, no HAs, no rashes, no melena/hematochezia.    No myalgias or arthralgias.  No focal weakness, paresthesias, or tremors.  No acute vision or hearing abnormalities. No n/v/d or abd pain.  No palpitations.     Past Medical History:  Diagnosis Date  . Acute upper GI bleed 06/2014   while pt taking coumadin, plavix, and meloxicam---despite being told not to take coumadin.  . Anginal pain ([place])    Nonobstructive CAD 2014; however, her cardiologist put her on a statin for this and NOT for hyperlipidemia per pt report.  Atyp CP 08/2017 at card f/u, plan for myoc perf imaging.  . Anxiety    panic attacks  . Asthma    w/ asbestososis   . BPPV (benign paroxysmal positional vertigo) 12/16/2012  . Chronic diastolic CHF (congestive heart failure) (HCC)    dry wt as of 11/06/16 is 168 lbs.  . Chronic lower back pain   . COPD (chronic obstructive pulmonary disease) ([place])   . DDD (degenerative disc disease)    lumbar and cervical.   . Diverticular disease   . Fibromyalgia    Patient states dx was around her  late 67s but she had sx's for years prior to this.  . H/O hiatal hernia   . History of pneumonia    hospitalized 12/2011, 02/2013, and 07/2013 [facility]) for this  . HTN (hypertension)    Renal artery dopplers 04/2013 neg for stenosis.  . Hypervitaminosis D 09/27/2019   over-supplemented.  Stopped vit D and plan recheck 2 mo.  . Idiopathic angio-edema-urticaria [id number]; 2021   Angioedema component was very minimal.  2021->Dr. Bobbitt (allergist) eval.  . Insomnia   . Iron deficiency anemia     Hematologist in [place], [place] did extensive w/u; no cause found; failed oral supplement;; gets fairly regular (q15m or so) IV iron infusions (Venofer -iron sucrose- 200mg  with procrit.  "for 14 yr I've been getting blood work q month & getting infusions prn" (07/12/2013).  Dr. Marin Olp locally, iron infusions done, EPO deficiency dx'd  . Migraine syndrome    "not as often anymore; used to be ~ q wk" (07/12/2013)  . Mixed incontinence urge and stress   . Nephrolithiasis    "passed all on my own or they are still in there" (07/12/2013)  . Neuroleptic induced parkinsonism ([place]) 2018   Dr. Carles Collet, neuro, saw her 11/24/17 and recommended d/c of abilify as first step.  D/c'd abilify and pt got complete recovery.  . Oropharyngeal dysphagia    swallowing study speech path 05/2020. Gastric bx's showed gastritis, h pylori NEG  . OSA on CPAP    [place] --had another sleep study 10/2015 w/pulm Dr. Camillo Flaming.  . Osteoarthritis    "severe; progressing fast" (07/12/2013); multiple joints-not surgical candidate for TKR (03/2015).  Triamcinolon knee injections by Dr. Tessa Lerner 12/2017.  [name] Kitchen Pernicious anemia 08/24/2014  . Pleural plaque with presence of asbestos 07/22/2013  . Pulmonary embolism ([place]) 07/2013   Dx at [facility] with very small peripheral upper lobe pe 07/2013: pt took coumadin for about 8-9 mo  . Pyelonephritis    "several times over the last 30 yr" (07/12/2013)  . RBBB (right bundle branch block)   . Recurrent major depression ([place])   . Recurrent UTI    hx of hospitalization for pyelonephritis; started abx prophylaxis 06/2015  . Syncope    Hypotensive; ED visit--Dr. Terrence Dupont did Cath--nonobstructive CAD, EF 55-60%.  In retrospect, suspect pt rx med misuse/polypharmacy    Past Surgical History:  Procedure Laterality Date  . APPENDECTOMY  1960  . AXILLARY SURGERY Left 1978   Multiple "lump" in armpit per pt  . BIOPSY  06/17/2020   Gastric bx->gastritis, h pylori neg.  Procedure: BIOPSY;  Surgeon: Carol Ada, MD;  Location: [facility];  Service: Endoscopy;;  . CARDIAC CATHETERIZATION  01/2013   nonobstructive CAD, EF 55-60%  . CARDIOVASCULAR STRESS TEST  02/22/15   Low risk myocard perf imaging; wall motion normal, normal EF  . carotid duplex doppler  10/21/2017   R vertebral flow suggestive of possible distal obstruction.  Pt declined further w/u as of 10/29/17 but need to revisit this problem periodically.  . COCCYX REMOVAL  1972  . DEXA  06/05/2017   T-score -3.1  . DILATION AND CURETTAGE OF UTERUS  ? 1970's  . ESOPHAGOGASTRODUODENOSCOPY N/A 07/19/2014   Gastritis found + in the setting of supratherapeutic INR, +plavix, + meloxicam.  . ESOPHAGOGASTRODUODENOSCOPY (EGD) WITH PROPOFOL N/A 06/17/2020   NO stricture or other prob to explain pt's dysphagia, dilation was done anyway.  Gastric bx's-->gastritis, h pylori neg. Procedure: ESOPHAGOGASTRODUODENOSCOPY (EGD) WITH PROPOFOL;  Surgeon: Carol Ada, MD;  Location: [facility];  Service: Endoscopy;  Laterality: N/A;  . EYE SURGERY Left 2012-2013   "injections for ~ 1 yr; don't really know what for" (07/12/2013)  . HEEL SPUR SURGERY Left 2008  . KNEE SURGERY  2005  . LEFT HEART CATHETERIZATION WITH CORONARY ANGIOGRAM N/A 01/30/2013   Procedure: LEFT HEART CATHETERIZATION WITH CORONARY ANGIOGRAM;  Surgeon: Clent Demark, MD;  Location: [facility] CATH LAB;  Service: Cardiovascular;  Laterality: N/A;  . MALONEY DILATION  06/17/2020   Procedure: Venia Minks DILATION;  Surgeon: Carol Ada, MD;  Location: [facility];  Service: Endoscopy;;  . PLANTAR FASCIA RELEASE Left 2008  . SPIROMETRY  04/25/14   In hosp for acute asthma/COPD flare: mixed obstructive and restrictive lung disease. The FEV1 is severely reduced at 45% predicted.  FEV1 signif decreased compared to prior spirometry 07/23/13.  . TENDON RELEASE  1996   Right forearm and hand  . TOTAL ABDOMINAL HYSTERECTOMY  1974  . TRANSTHORACIC ECHOCARDIOGRAM  01/2013; 04/2014;08/2015; 09/2017    2014--NORMAL.  2015--focal basal septal hypertrophy, EF 55-60%, grade I diast dysfxn, mild LAE.  08/2015 EF 55-60%, nl LV syst fxn, grade I DD, valves wnl. 10/21/17: EF 65-70%, grd I DD, o/w normal.    Outpatient Medications Prior to Visit  Medication Sig Dispense Refill  . albuterol (VENTOLIN HFA) 108 (90 Base) MCG/ACT inhaler Inhale 1-2 puffs into the lungs every 6 (six) hours as needed for wheezing or shortness of breath. 18 g 1  . alendronate (FOSAMAX) 70 MG tablet Take 1 tablet (70 mg total) by mouth every 7 (seven) days. Take with a full glass of water on an empty stomach, first thing in the morning and remain up right for 30 minutes after taking. 12 tablet 3  . ALPRAZolam (XANAX) 1 MG tablet TAKE 1 TABLET THREE TIMES DAILY AS NEEDED FOR ANXIETY. 90 tablet 5  . aspirin 81 MG tablet Take 81 mg by mouth at bedtime.    . budesonide-formoterol (SYMBICORT) 80-4.5 MCG/ACT inhaler 2 puffs    . cholecalciferol (VITAMIN D3) 25 MCG (1000 UNIT) tablet 1 tablet    . docusate sodium (COLACE) 100 MG capsule Take 1 capsule (100 mg total) by mouth 2 (two) times daily. (Patient taking differently: Take 100 mg by mouth daily.) 10 capsule 0  . donepezil (ARICEPT) 10 MG tablet Take 1 tablet (10 mg total) by mouth at bedtime. 90 tablet 3  . DULoxetine (CYMBALTA) 30 MG capsule TAKE 1 CAPSULE ONCE DAILY ALONG WITH 60MG  CAPSULE FOR TOTAL 90MG  DAILY. (Patient taking differently: Take 30 mg by mouth See admin instructions.) 90 capsule 3  . DULoxetine (CYMBALTA) 60 MG capsule TAKE 1 CAPSULE A DAY TO BE COMBINED WITH 30MG  CAPSULE (Patient taking differently: Take 60 mg by mouth See admin instructions.) 90 capsule 3  . fluconazole (DIFLUCAN) 100 MG tablet TAKE (1) TABLET DAILY AS NEEDED FOR THRUSH 15 tablet 0  . fluticasone (FLONASE) 50 MCG/ACT nasal spray Place 2 sprays into both nostrils daily. (Patient taking differently: Place 2 sprays into both nostrils daily as needed for allergies.) 16 g 11  . furosemide (LASIX) 40  MG tablet Take 1 tablet (40 mg total) by mouth daily as needed for fluid.    [name] Kitchen ipratropium (ATROVENT) 0.03 % nasal spray Place 2 sprays into both nostrils every 12 (twelve) hours. 30 mL 11  . isosorbide mononitrate (IMDUR) 30 MG 24 hr tablet TAKE 2 TABLETS IN THE MORNING AND 1 TABLET IN THE EVENING. (Patient taking differently: Take 30-60 mg by  mouth See admin instructions. TAKE 2 TABLETS IN THE MORNING AND 1 TABLET IN THE EVENING.) 270 tablet 1  . lamoTRIgine (LAMICTAL) 150 MG tablet TAKE 1 TABLET EACH DAY. 90 tablet 3  . metoprolol succinate (TOPROL-XL) 100 MG 24 hr tablet 1 tablet    . metroNIDAZOLE (METROGEL) 0.75 % gel Apply 1 application topically at bedtime.    . Multiple Vitamin (MULTIVITAMIN WITH MINERALS) TABS tablet Take 1 tablet by mouth daily.    . nitroGLYCERIN (NITROSTAT) 0.4 MG SL tablet Place 1 tablet (0.4 mg total) under the tongue every 5 (five) minutes as needed for chest pain (x 3 doses). 25 tablet 11  . ondansetron (ZOFRAN) 8 MG tablet Take 1 tablet (8 mg total) by mouth every 8 (eight) hours as needed for nausea or vomiting. 30 tablet 6  . oxyCODONE-acetaminophen (PERCOCET) 10-325 MG tablet Take 1 tablet by mouth every 4 (four) hours as needed for pain. 100 tablet 0  . pantoprazole (PROTONIX) 40 MG tablet TAKE 1 TABLET BY MOUTH TWICE DAILY. (Patient taking differently: Take 40 mg by mouth daily.) 180 tablet 0  . rosuvastatin (CRESTOR) 20 MG tablet TAKE 1 TABLET ONCE DAILY. 30 tablet 0  . solifenacin (VESICARE) 10 MG tablet TAKE 1 TABLET ONCE DAILY. 30 tablet 0  . traZODone (DESYREL) 50 MG tablet Take by mouth. 2-4 tablets at bedtime    . calcium citrate-vitamin D (CITRACAL+D) 315-200 MG-UNIT tablet 1 tablet (Patient not taking: Reported on 12/28/2020)    . EPINEPHrine 0.3 mg/0.3 mL IJ SOAJ injection Inject 0.3 mLs (0.3 mg total) into the muscle as needed for anaphylaxis. (Patient not taking: Reported on 12/28/2020) 1 each 1  . metoprolol succinate (TOPROL-XL) 25 MG 24 hr tablet  Take 0.5 tablets (12.5 mg total) by mouth daily. Take with or immediately following a meal. (Patient not taking: Reported on 12/28/2020) 30 tablet 0  . mupirocin ointment (BACTROBAN) 2 % Apply 1 application topically 2 (two) times daily. Can be ointment or cream- whichever on formulary and cheapest for her (Patient not taking: Reported on 12/28/2020) 22 g 0  . ALPRAZolam (XANAX) 1 MG tablet 1 tablet     No facility-administered medications prior to visit.    Allergies  Allergen Reactions  . Abilify [Aripiprazole] Other (See Comments)    parkinsonism  . Penicillins Itching, Swelling and Rash    Tolerated Cefepime in ED. Has patient had a PCN reaction causing immediate rash, facial/tongue/throat swelling, SOB or lightheadedness with hypotension: Yes Has patient had a PCN reaction causing severe rash involving mucus membranes or skin necrosis: No Has patient had a PCN reaction that required hospitalization: No  Has patient had a PCN reaction occurring within the last 10 years: No    ROS As per HPI  PE: Vitals with BMI 12/28/2020 12/13/2020 11/25/2020  Height 5\' 4"  5\' 4"  -  Weight 138 lbs 141 lbs 13 oz -  BMI [id number] 0000000 -  Systolic Q000111Q [id number] Q000111Q  Diastolic 81 63 66  Pulse 82 79 63  O2 sat 93% on RA today.   Gen: Alert, well appearing.  Patient is oriented to person, place, time, and situation. AFFECT: pleasant, lucid thought and speech. CV: RRR, no m/r/g.   LUNGS: CTA bilat, nonlabored resps, good aeration in all lung fields. EXT: no clubbing or cyanosis.  no edema.    LABS:  Lab Results  Component Value Date   TSH 2.750 10/04/2019   Lab Results  Component Value Date   WBC 7.2 11/25/2020  HGB 13.3 11/25/2020   HCT 41.5 11/25/2020   MCV 89.4 11/25/2020   PLT 184 11/25/2020   Lab Results  Component Value Date   CREATININE 0.65 11/25/2020   BUN 6 (L) 11/25/2020   NA 141 11/25/2020   K 3.2 (L) 11/25/2020   CL 103 11/25/2020   CO2 25 11/25/2020   Lab Results  Component  Value Date   ALT 13 11/24/2020   AST 18 11/24/2020   ALKPHOS 58 11/24/2020   BILITOT 0.3 11/24/2020   Lab Results  Component Value Date   CHOL 172 09/22/2018   Lab Results  Component Value Date   HDL 43.70 09/22/2018   Lab Results  Component Value Date   LDLCALC 92 09/22/2018   Lab Results  Component Value Date   TRIG 184.0 (H) 09/22/2018   Lab Results  Component Value Date   CHOLHDL 4 09/22/2018   Lab Results  Component Value Date   HGBA1C 5.6 01/02/2016    UA today: 1+ blood, trace leuks, +odor  IMPRESSION AND PLAN:  1) Uncontrolled HTN: increase toprol xl 100mg  to 1 and 1/2 tabs qd. Lytes/cr normal 1 mo ago.  2) Urge incontinence: vesicare effective but only short time period. Will try Myrbetriq 25mg  qd. Consider inc dose to 50mg  at f/u in 2 wks.  3) Recurrent UTI vs chronic atypical urge incont sx's: UA abnormal today. Will start cefdinir 300mg  bid x 5d and send urine for c/s.  4) Recurrent/treatment resistant MDD + chronic anxiety: all stable. Cont lamictal 150mg  qd, xanax 1mg  tid, and dulox 90 qd.  5) Chronic hypoxic resp failure: copd +pulm fibrosis/asbestosis. Stable, cont oxygen prn daytime + nocturnal, albut prn. Per pt request will refer to [facility] b/c she wishes to transfer care from Dr. to their practice.  6) MCI: unclear benefit from aricept at this time but ok to continue for now.  7) HLD, nonosbt CAD: she is fasting today so we'll check lipid panel today. Cont rosuva 20mg  qd.  An After Visit Summary was printed and given to the patient.  FOLLOW UP: No follow-ups on file.  Signed:  , MD           12/28/2020

## 2020-12-28 NOTE — Patient Instructions (Signed)
Take 1 and 1/2 of your metoprolol 100mg  tabs once daily.

## 2020-12-29 ENCOUNTER — Ambulatory Visit: Payer: Medicare Other | Admitting: Family Medicine

## 2020-12-30 LAB — URINE CULTURE
MICRO NUMBER:: 11392935
SPECIMEN QUALITY:: ADEQUATE

## 2021-01-05 ENCOUNTER — Other Ambulatory Visit: Payer: Self-pay | Admitting: Family Medicine

## 2021-01-10 ENCOUNTER — Other Ambulatory Visit: Payer: Self-pay

## 2021-01-11 ENCOUNTER — Ambulatory Visit (INDEPENDENT_AMBULATORY_CARE_PROVIDER_SITE_OTHER): Payer: Medicare Other | Admitting: Family Medicine

## 2021-01-11 ENCOUNTER — Other Ambulatory Visit: Payer: Self-pay | Admitting: Family Medicine

## 2021-01-11 ENCOUNTER — Encounter: Payer: Self-pay | Admitting: Family Medicine

## 2021-01-11 VITALS — BP 86/52 | HR 76 | Temp 97.5°F | Resp 16 | Ht 64.0 in | Wt 135.4 lb

## 2021-01-11 DIAGNOSIS — I959 Hypotension, unspecified: Secondary | ICD-10-CM | POA: Diagnosis not present

## 2021-01-11 DIAGNOSIS — N3941 Urge incontinence: Secondary | ICD-10-CM | POA: Diagnosis not present

## 2021-01-11 DIAGNOSIS — I10 Essential (primary) hypertension: Secondary | ICD-10-CM | POA: Diagnosis not present

## 2021-01-11 MED ORDER — METOPROLOL SUCCINATE ER 100 MG PO TB24: ORAL_TABLET | ORAL | 3 refills | Status: DC

## 2021-01-11 MED ORDER — MIRABEGRON ER 25 MG PO TB24: 25.0000 mg | ORAL_TABLET | Freq: Every day | ORAL | 3 refills | Status: DC

## 2021-01-11 NOTE — Progress Notes (Signed)
OFFICE VISIT  01/11/2021  CC:  Chief Complaint  Patient presents with  . Follow-up    Hypertension, pt is not fasting    HPI:    Patient is a 75 y.o. Caucasian female who presents for 2 week f/u HTN and urge incontinence. A/P as of last visit: "1) Uncontrolled HTN: increase toprol xl 100mg  to 1 and 1/2 tabs qd. Lytes/cr normal 1 mo ago.  2) Urge incontinence: vesicare effective but only short time period. Will try Myrbetriq 25mg  qd. Consider inc dose to 50mg  at f/u in 2 wks.  3) Recurrent UTI vs chronic atypical urge incont sx's: UA abnormal today. Will start cefdinir 300mg  bid x 5d and send urine for c/s.  4) Recurrent/treatment resistant MDD + chronic anxiety: all stable. Cont lamictal 150mg  qd, xanax 1mg  tid, and dulox 90 qd.  5) Chronic hypoxic resp failure: copd +pulm fibrosis/asbestosis. Stable, cont oxygen prn daytime + nocturnal, albut prn. Per pt request will refer to [facility] b/c she wishes to transfer care from Dr. Camillo Flaming to their practice.  6) MCI: unclear benefit from aricept at this time but ok to continue for now.  7) HLD, nonosbt CAD: she is fasting today so we'll check lipid panel today. Cont rosuva 20mg  qd."  INTERIM HX: Feeling fine, no acute complaints.  HTN:  qAM bp check 130/80 more often, nothing high, however has low bp (<100 syst) q2-3d and feels drowsy and lightheaded.  HR 80s.  Urge incont: doing great---dry all day on mirbetriq!  She d/c'd her vesicare b/c it had not helped any. Urine clx was neg last visit. Still with some odor but burning is infrequent.    Past Medical History:  Diagnosis Date  . Acute upper GI bleed 06/2014   while pt taking coumadin, plavix, and meloxicam---despite being told not to take coumadin.  . Anginal pain ([place])    Nonobstructive CAD 2014; however, her cardiologist put her on a statin for this and NOT for hyperlipidemia per pt report.  Atyp CP 08/2017 at card f/u, plan for myoc perf imaging.  . Anxiety     panic attacks  . Asthma    w/ asbestososis   . BPPV (benign paroxysmal positional vertigo) 12/16/2012  . Chronic diastolic CHF (congestive heart failure) (HCC)    dry wt as of 11/06/16 is 168 lbs.  . Chronic lower back pain   . COPD (chronic obstructive pulmonary disease) ([place])   . DDD (degenerative disc disease)    lumbar and cervical.   . Diverticular disease   . Fibromyalgia    Patient states dx was around her late 65s but she had sx's for years prior to this.  . H/O hiatal hernia   . History of pneumonia    hospitalized 12/2011, 02/2013, and 07/2013 [facility]) for this  . HTN (hypertension)    Renal artery dopplers 04/2013 neg for stenosis.  . Hypervitaminosis D 09/27/2019   over-supplemented.  Stopped vit D and plan recheck 2 mo.  . Idiopathic angio-edema-urticaria [id number]; 2021   Angioedema component was very minimal.  2021->Dr. Bobbitt (allergist) eval.  . Insomnia   . Iron deficiency anemia    Hematologist in [place], [place] did extensive w/u; no cause found; failed oral supplement;; gets fairly regular (q30m or so) IV iron infusions (Venofer -iron sucrose- 200mg  with procrit.  "for 14 yr I've been getting blood work q month & getting infusions prn" (07/12/2013).  Dr. Marin Olp locally, iron infusions done, EPO deficiency dx'd  . Migraine syndrome    "  not as often anymore; used to be ~ q wk" (07/12/2013)  . Mixed incontinence urge and stress   . Nephrolithiasis    "passed all on my own or they are still in there" (07/12/2013)  . Neuroleptic induced parkinsonism ([place]) 2018   Dr. Carles Collet, neuro, saw her 11/24/17 and recommended d/c of abilify as first step.  D/c'd abilify and pt got complete recovery.  . Oropharyngeal dysphagia    swallowing study speech path 05/2020. Gastric bx's showed gastritis, h pylori NEG  . OSA on CPAP    prior to move to [place]--had another sleep study 10/2015 w/pulm Dr. Camillo Flaming.  . Osteoarthritis    "severe; progressing fast" (07/12/2013); multiple joints-not surgical  candidate for TKR (03/2015).  Triamcinolon knee injections by Dr. Tessa Lerner 12/2017.  [name] Kitchen Pernicious anemia 08/24/2014  . Pleural plaque with presence of asbestos 07/22/2013  . Pulmonary embolism ([place]) 07/2013   Dx at [facility] with very small peripheral upper lobe pe 07/2013: pt took coumadin for about 8-9 mo  . Pyelonephritis    "several times over the last 30 yr" (07/12/2013)  . RBBB (right bundle branch block)   . Recurrent major depression ([place])   . Recurrent UTI    hx of hospitalization for pyelonephritis; started abx prophylaxis 06/2015  . Syncope    Hypotensive; ED visit--Dr. Terrence Dupont did Cath--nonobstructive CAD, EF 55-60%.  In retrospect, suspect pt rx med misuse/polypharmacy    Past Surgical History:  Procedure Laterality Date  . APPENDECTOMY  1960  . AXILLARY SURGERY Left 1978   Multiple "lump" in armpit per pt  . BIOPSY  06/17/2020   Gastric bx->gastritis, h pylori neg.  Procedure: BIOPSY;  Surgeon: Carol Ada, MD;  Location: [facility];  Service: Endoscopy;;  . CARDIAC CATHETERIZATION  01/2013   nonobstructive CAD, EF 55-60%  . CARDIOVASCULAR STRESS TEST  02/22/15   Low risk myocard perf imaging; wall motion normal, normal EF  . carotid duplex doppler  10/21/2017   R vertebral flow suggestive of possible distal obstruction.  Pt declined further w/u as of 10/29/17 but need to revisit this problem periodically.  . COCCYX REMOVAL  1972  . DEXA  06/05/2017   T-score -3.1  . DILATION AND CURETTAGE OF UTERUS  ? 1970's  . ESOPHAGOGASTRODUODENOSCOPY N/A 07/19/2014   Gastritis found + in the setting of supratherapeutic INR, +plavix, + meloxicam.  . ESOPHAGOGASTRODUODENOSCOPY (EGD) WITH PROPOFOL N/A 06/17/2020   NO stricture or other prob to explain pt's dysphagia, dilation was done anyway.  Gastric bx's-->gastritis, h pylori neg. Procedure: ESOPHAGOGASTRODUODENOSCOPY (EGD) WITH PROPOFOL;  Surgeon: Carol Ada, MD;  Location: [facility];  Service: Endoscopy;  Laterality: N/A;  . EYE SURGERY  Left 2012-2013   "injections for ~ 1 yr; don't really know what for" (07/12/2013)  . HEEL SPUR SURGERY Left 2008  . KNEE SURGERY  2005  . LEFT HEART CATHETERIZATION WITH CORONARY ANGIOGRAM N/A 01/30/2013   Procedure: LEFT HEART CATHETERIZATION WITH CORONARY ANGIOGRAM;  Surgeon: Clent Demark, MD;  Location: [facility] CATH LAB;  Service: Cardiovascular;  Laterality: N/A;  . MALONEY DILATION  06/17/2020   Procedure: Venia Minks DILATION;  Surgeon: Carol Ada, MD;  Location: [facility];  Service: Endoscopy;;  . PLANTAR FASCIA RELEASE Left 2008  . SPIROMETRY  04/25/14   In hosp for acute asthma/COPD flare: mixed obstructive and restrictive lung disease. The FEV1 is severely reduced at 45% predicted.  FEV1 signif decreased compared to prior spirometry 07/23/13.  . TENDON RELEASE  1996   Right forearm and hand  .  TOTAL ABDOMINAL HYSTERECTOMY  1974  . TRANSTHORACIC ECHOCARDIOGRAM  01/2013; 04/2014;08/2015; 09/2017   2014--NORMAL.  2015--focal basal septal hypertrophy, EF 55-60%, grade I diast dysfxn, mild LAE.  08/2015 EF 55-60%, nl LV syst fxn, grade I DD, valves wnl. 10/21/17: EF 65-70%, grd I DD, o/w normal.    Outpatient Medications Prior to Visit  Medication Sig Dispense Refill  . albuterol (VENTOLIN HFA) 108 (90 Base) MCG/ACT inhaler Inhale 1-2 puffs into the lungs every 6 (six) hours as needed for wheezing or shortness of breath. 18 g 1  . alendronate (FOSAMAX) 70 MG tablet Take 1 tablet (70 mg total) by mouth every 7 (seven) days. Take with a full glass of water on an empty stomach, first thing in the morning and remain up right for 30 minutes after taking. 12 tablet 3  . ALPRAZolam (XANAX) 1 MG tablet TAKE 1 TABLET THREE TIMES DAILY AS NEEDED FOR ANXIETY. 90 tablet 5  . aspirin 81 MG tablet Take 81 mg by mouth at bedtime.    . budesonide-formoterol (SYMBICORT) 80-4.5 MCG/ACT inhaler 2 puffs    . cholecalciferol (VITAMIN D3) 25 MCG (1000 UNIT) tablet 1 tablet    . docusate sodium (COLACE) 100 MG capsule  Take 1 capsule (100 mg total) by mouth 2 (two) times daily. 10 capsule 0  . donepezil (ARICEPT) 10 MG tablet Take 1 tablet (10 mg total) by mouth at bedtime. 90 tablet 3  . DULoxetine (CYMBALTA) 30 MG capsule TAKE 1 CAPSULE ONCE DAILY ALONG WITH 60MG  CAPSULE FOR TOTAL 90MG  DAILY. (Patient taking differently: Take 30 mg by mouth See admin instructions.) 90 capsule 3  . DULoxetine (CYMBALTA) 60 MG capsule TAKE 1 CAPSULE A DAY TO BE COMBINED WITH 30MG  CAPSULE (Patient taking differently: Take 60 mg by mouth See admin instructions.) 90 capsule 3  . EPINEPHrine 0.3 mg/0.3 mL IJ SOAJ injection Inject 0.3 mg into the muscle as needed for anaphylaxis. 1 each 1  . fluconazole (DIFLUCAN) 100 MG tablet TAKE (1) TABLET DAILY AS NEEDED FOR THRUSH 15 tablet 0  . fluticasone (FLONASE) 50 MCG/ACT nasal spray Place 2 sprays into both nostrils daily. (Patient taking differently: Place 2 sprays into both nostrils daily as needed for allergies.) 16 g 11  . furosemide (LASIX) 40 MG tablet Take 1 tablet (40 mg total) by mouth daily as needed for fluid.    [name] Kitchen ipratropium (ATROVENT) 0.03 % nasal spray Place 2 sprays into both nostrils every 12 (twelve) hours. 30 mL 11  . isosorbide mononitrate (IMDUR) 30 MG 24 hr tablet TAKE 2 TABLETS IN THE MORNING AND 1 TABLET IN THE EVENING. (Patient taking differently: Take 30-60 mg by mouth See admin instructions. TAKE 2 TABLETS IN THE MORNING AND 1 TABLET IN THE EVENING.) 270 tablet 1  . lamoTRIgine (LAMICTAL) 150 MG tablet TAKE 1 TABLET EACH DAY. 90 tablet 3  . metroNIDAZOLE (METROGEL) 0.75 % gel Apply 1 application topically at bedtime.    . Multiple Vitamin (MULTIVITAMIN WITH MINERALS) TABS tablet Take 1 tablet by mouth daily.    . ondansetron (ZOFRAN) 8 MG tablet Take 1 tablet (8 mg total) by mouth every 8 (eight) hours as needed for nausea or vomiting. 30 tablet 6  . oxyCODONE-acetaminophen (PERCOCET) 10-325 MG tablet Take 1 tablet by mouth every 4 (four) hours as needed for pain.  100 tablet 0  . pantoprazole (PROTONIX) 40 MG tablet TAKE 1 TABLET BY MOUTH TWICE DAILY. (Patient taking differently: Take 40 mg by mouth daily.) 180 tablet 0  .  rosuvastatin (CRESTOR) 20 MG tablet TAKE 1 TABLET ONCE DAILY. 30 tablet 0  . traZODone (DESYREL) 50 MG tablet Take by mouth. 2-4 tablets at bedtime    . metoprolol succinate (TOPROL-XL) 100 MG 24 hr tablet 1 and 1/2 tabs po qd 135 tablet 3  . mirabegron ER (MYRBETRIQ) 25 MG TB24 tablet Take 1 tablet (25 mg total) by mouth daily. 30 tablet 0  . solifenacin (VESICARE) 10 MG tablet TAKE 1 TABLET ONCE DAILY. 30 tablet 0  . calcium citrate-vitamin D (CITRACAL+D) 315-200 MG-UNIT tablet 1 tablet (Patient not taking: No sig reported)    . mupirocin ointment (BACTROBAN) 2 % Apply 1 application topically 2 (two) times daily. Can be ointment or cream- whichever on formulary and cheapest for her (Patient not taking: No sig reported) 22 g 0  . nitroGLYCERIN (NITROSTAT) 0.4 MG SL tablet Place 1 tablet (0.4 mg total) under the tongue every 5 (five) minutes as needed for chest pain (x 3 doses). (Patient not taking: Reported on 01/11/2021) 25 tablet 11  . cefdinir (OMNICEF) 300 MG capsule Take 1 capsule (300 mg total) by mouth 2 (two) times daily. (Patient not taking: Reported on 01/11/2021) 6 capsule 0   No facility-administered medications prior to visit.    Allergies  Allergen Reactions  . Abilify [Aripiprazole] Other (See Comments)    parkinsonism  . Penicillins Itching, Swelling and Rash    Tolerated Cefepime in ED. Has patient had a PCN reaction causing immediate rash, facial/tongue/throat swelling, SOB or lightheadedness with hypotension: Yes Has patient had a PCN reaction causing severe rash involving mucus membranes or skin necrosis: No Has patient had a PCN reaction that required hospitalization: No  Has patient had a PCN reaction occurring within the last 10 years: No     ROS As per HPI  PE: Vitals with BMI 01/11/2021 12/28/2020  12/13/2020  Height 5\' 4"  5\' 4"  5\' 4"   Weight 135 lbs 6 oz 138 lbs 141 lbs 13 oz  BMI 23.23 96.29 52.84  Systolic 86 132 440  Diastolic 52 81 63  Pulse 76 82 79    Gen: Alert, well appearing.  Patient is oriented to person, place, time, and situation. AFFECT: pleasant, lucid thought and speech. CV: RRR, no m/r/g.   LUNGS: CTA bilat, nonlabored resps, good aeration in all lung fields. EXT: no clubbing or cyanosis.  no edema.    LABS:    Chemistry      Component Value Date/Time   NA 141 11/25/2020 0217   NA 141 06/05/2015 1315   K 3.2 (L) 11/25/2020 0217   K 3.9 06/05/2015 1315   CL 103 11/25/2020 0217   CL 103 06/05/2015 1315   CO2 25 11/25/2020 0217   CO2 28 06/05/2015 1315   BUN 6 (L) 11/25/2020 0217   BUN 14 06/05/2015 1315   CREATININE 0.65 11/25/2020 0217   CREATININE 0.87 02/03/2017 1523      Component Value Date/Time   CALCIUM 9.1 11/25/2020 0217   CALCIUM 9.0 06/05/2015 1315   ALKPHOS 58 11/24/2020 1749   ALKPHOS 84 06/05/2015 1315   AST 18 11/24/2020 1749   AST 23 06/05/2015 1315   ALT 13 11/24/2020 1749   ALT 17 06/05/2015 1315   BILITOT 0.3 11/24/2020 1749   BILITOT 1.00 06/05/2015 1315       IMPRESSION AND PLAN:  1) HTN: labile.  Turns out she never increased her toprol xl to 150mg  qd last visit as planned. Best approach for her at this  time given her occ highs and occ lows is to take toprol xl 100 mg qd and take a 1/2 tab qd prn bp >160/90.  Her HR has been fine on this med.  2) Urge incontinence, hx of recurrent UTI: great improvement with myrbetriq 25mg  qd--->continue this. No sign of acute UTI---she always has intermittent feeling of some dysuria and always c/o odor to her urine.  Reassured.  An After Visit Summary was printed and given to the patient.  FOLLOW UP: Return in about 3 months (around 04/11/2021) for routine chronic illness f/u.  Signed:  Crissie Sickles, MD           01/11/2021

## 2021-01-12 ENCOUNTER — Other Ambulatory Visit: Payer: Self-pay | Admitting: Podiatry

## 2021-01-12 DIAGNOSIS — G8918 Other acute postprocedural pain: Secondary | ICD-10-CM

## 2021-01-12 DIAGNOSIS — L84 Corns and callosities: Secondary | ICD-10-CM | POA: Diagnosis not present

## 2021-01-12 DIAGNOSIS — M2041 Other hammer toe(s) (acquired), right foot: Secondary | ICD-10-CM | POA: Diagnosis not present

## 2021-01-12 MED ORDER — OXYCODONE HCL 5 MG PO TABS: 5.0000 mg | ORAL_TABLET | Freq: Four times a day (QID) | ORAL | 0 refills | Status: AC | PRN

## 2021-01-12 NOTE — Progress Notes (Signed)
01/12/21 [place] R hammertoe correction and corn excision

## 2021-01-17 ENCOUNTER — Telehealth: Payer: Self-pay

## 2021-01-17 DIAGNOSIS — M1712 Unilateral primary osteoarthritis, left knee: Secondary | ICD-10-CM

## 2021-01-17 DIAGNOSIS — M1711 Unilateral primary osteoarthritis, right knee: Secondary | ICD-10-CM

## 2021-01-17 NOTE — Telephone Encounter (Signed)
Mrs. Linda [name] Oxycodone will be out of her pain medicince on 01/22/2020. But she just had major foot surgery. And the Surgeon told her take your pain medicine. So she maybe out of Oxycodone sooner.   Call back phone [phone].

## 2021-01-18 ENCOUNTER — Ambulatory Visit (INDEPENDENT_AMBULATORY_CARE_PROVIDER_SITE_OTHER): Payer: Medicare Other | Admitting: Podiatry

## 2021-01-18 ENCOUNTER — Other Ambulatory Visit: Payer: Self-pay

## 2021-01-18 ENCOUNTER — Ambulatory Visit (INDEPENDENT_AMBULATORY_CARE_PROVIDER_SITE_OTHER): Payer: Medicare Other

## 2021-01-18 ENCOUNTER — Encounter: Payer: Self-pay | Admitting: Podiatry

## 2021-01-18 DIAGNOSIS — M2041 Other hammer toe(s) (acquired), right foot: Secondary | ICD-10-CM

## 2021-01-18 DIAGNOSIS — Z9889 Other specified postprocedural states: Secondary | ICD-10-CM

## 2021-01-18 DIAGNOSIS — L84 Corns and callosities: Secondary | ICD-10-CM

## 2021-01-19 MED ORDER — OXYCODONE-ACETAMINOPHEN 10-325 MG PO TABS
1.0000 | ORAL_TABLET | Freq: Four times a day (QID) | ORAL | 0 refills | Status: DC | PRN
Start: 2021-01-19 — End: 2021-01-24

## 2021-01-19 NOTE — Telephone Encounter (Signed)
PMP was Reviewed: Oxycodone e-scribed today.  Placed a call to Ms. Cortner regarding the above, she verbalizes understanding.

## 2021-01-19 NOTE — Addendum Note (Signed)
Addended by: Bayard Hugger on: 01/19/2021 02:37 PM   Modules accepted: Orders

## 2021-01-21 ENCOUNTER — Encounter: Payer: Self-pay | Admitting: Podiatry

## 2021-01-21 NOTE — Progress Notes (Signed)
  Subjective:  Patient ID: Linda [name], female    DOB: Sep 22, 1946,  MRN: [id number]  Chief Complaint  Patient presents with  . Routine Post Op    PT stated that she is in a lot of pain still and is unable to put weight on her foot.    DOS: 01/18/2021 Procedure: Fifth hammertoe correction and partial syndactylization with excision of corn right foot  75 y.o. female returns for post-op check.  Feeling well other than pain  Review of Systems: Negative except as noted in the HPI. Denies N/V/F/Ch.   Objective:  There were no vitals filed for this visit. There is no height or weight on file to calculate BMI. Constitutional Well developed. Well nourished.  Vascular Foot warm and well perfused. Capillary refill normal to all digits.   Neurologic Normal speech. Oriented to person, place, and time. Epicritic sensation to light touch grossly present bilaterally.  Dermatologic Skin healing well without signs of infection. Skin edges well coapted without signs of infection.  Orthopedic: Tenderness to palpation noted about the surgical site.   Radiographs: Consistent with postoperative changes of arthroplasty fifth toe Assessment:   1. Hammertoe of right foot   2. Post-operative state   3. [place] molle    Plan:  Patient was evaluated and treated and all questions answered.  S/p foot surgery right -Progressing as expected post-operatively. -XR: Reviewed with patient -WB Status: WBAT in surgical shoe -Sutures: We will remove next week. -Medications: No refills required -Foot redressed.  No follow-ups on file.

## 2021-01-24 ENCOUNTER — Encounter: Payer: Self-pay | Admitting: Physical Medicine & Rehabilitation

## 2021-01-24 ENCOUNTER — Other Ambulatory Visit: Payer: Self-pay

## 2021-01-24 ENCOUNTER — Encounter: Payer: Medicare Other | Attending: Physical Medicine & Rehabilitation | Admitting: Physical Medicine & Rehabilitation

## 2021-01-24 VITALS — BP 133/78 | HR 69 | Temp 98.2°F | Ht 64.0 in | Wt 135.0 lb

## 2021-01-24 DIAGNOSIS — M1712 Unilateral primary osteoarthritis, left knee: Secondary | ICD-10-CM | POA: Diagnosis not present

## 2021-01-24 DIAGNOSIS — M1711 Unilateral primary osteoarthritis, right knee: Secondary | ICD-10-CM | POA: Insufficient documentation

## 2021-01-24 MED ORDER — CYCLOBENZAPRINE HCL 5 MG PO TABS: 5.0000 mg | ORAL_TABLET | Freq: Three times a day (TID) | ORAL | 3 refills | Status: DC | PRN

## 2021-01-24 MED ORDER — OXYCODONE-ACETAMINOPHEN 10-325 MG PO TABS: 1.0000 | ORAL_TABLET | Freq: Four times a day (QID) | ORAL | 0 refills | Status: DC | PRN

## 2021-01-24 NOTE — Progress Notes (Signed)
PROCEDURE NOTE  DIAGNOSIS:  Primary OA bilateral knees  INTERVENTION:   ZILRETTA INJECTION     After informed consent and preparation of the skin with betadine and isopropyl alcohol, I injected 32MG  of zilretta dissolved into 5cc of diluent into the bilateral knees via anterolateral approach. Contents of syring were shaken vigorously and aspiration was performed prior to injection. The patient tolerated well, and no complications were encountered. Afterward the area was cleaned and dressed. Post- injection instructions were provided including ice  if swelling or pain should occur.    Meredith Staggers, MD, [facility] 01/24/2021

## 2021-01-24 NOTE — Patient Instructions (Signed)
PLEASE FEEL FREE TO CALL OUR OFFICE WITH ANY PROBLEMS OR QUESTIONS ([phone])      

## 2021-01-25 ENCOUNTER — Ambulatory Visit (INDEPENDENT_AMBULATORY_CARE_PROVIDER_SITE_OTHER): Payer: Medicare Other | Admitting: Podiatry

## 2021-01-25 ENCOUNTER — Other Ambulatory Visit: Payer: Self-pay

## 2021-01-25 DIAGNOSIS — L84 Corns and callosities: Secondary | ICD-10-CM

## 2021-01-25 DIAGNOSIS — Z9889 Other specified postprocedural states: Secondary | ICD-10-CM

## 2021-01-25 DIAGNOSIS — M2041 Other hammer toe(s) (acquired), right foot: Secondary | ICD-10-CM

## 2021-01-25 MED ORDER — OXYCODONE HCL 5 MG PO TABS: 5.0000 mg | ORAL_TABLET | ORAL | 0 refills | Status: AC | PRN

## 2021-01-26 ENCOUNTER — Encounter: Payer: Self-pay | Admitting: Podiatry

## 2021-01-26 NOTE — Progress Notes (Signed)
  Subjective:  Patient ID: Linda [name], female    DOB: 1946/11/26,  MRN: [id number]  Chief Complaint  Patient presents with  . Routine Post Op    PT stated that she is still in pain and having pain on the top of her foot     DOS: 01/18/2021 Procedure: Fifth hammertoe correction and partial syndactylization with excision of corn right foot  75 y.o. female returns for post-op check.  Feeling well other than pain which seems to be persistent.  She requests a refill of her pain medication  Review of Systems: Negative except as noted in the HPI. Denies N/V/F/Ch.   Objective:  There were no vitals filed for this visit. There is no height or weight on file to calculate BMI. Constitutional Well developed. Well nourished.  Vascular Foot warm and well perfused. Capillary refill normal to all digits.   Neurologic Normal speech. Oriented to person, place, and time. Epicritic sensation to light touch grossly present bilaterally.  Dermatologic Skin healing well without signs of infection. Skin edges well coapted without signs of infection.  Sutures intact  Orthopedic:  Moderate tenderness to palpation noted about the surgical site.   Radiographs: Consistent with postoperative changes of arthroplasty fifth toe Assessment:   No diagnosis found. Plan:  Patient was evaluated and treated and all questions answered.  S/p foot surgery right -Progressing as expected post-operatively. -XR: Reviewed with patient -WB Status: WBAT in surgical shoe -Sutures: Able to remove half of her sutures today, will leave the remainder intact until next week -Medications: Refill of oxycodone 5 mg 20 tablets sent to [facility] redressed.  She may begin bathing on Monday  Return in about 1 week (around 02/01/2021) for suture removal.

## 2021-02-01 ENCOUNTER — Other Ambulatory Visit: Payer: Self-pay | Admitting: Family Medicine

## 2021-02-01 ENCOUNTER — Encounter: Payer: Self-pay | Admitting: Podiatry

## 2021-02-01 ENCOUNTER — Encounter: Payer: Medicare Other | Admitting: Podiatry

## 2021-02-01 ENCOUNTER — Ambulatory Visit (INDEPENDENT_AMBULATORY_CARE_PROVIDER_SITE_OTHER): Payer: Medicare Other | Admitting: Podiatry

## 2021-02-01 ENCOUNTER — Other Ambulatory Visit: Payer: Self-pay

## 2021-02-01 ENCOUNTER — Ambulatory Visit (INDEPENDENT_AMBULATORY_CARE_PROVIDER_SITE_OTHER): Payer: Medicare Other

## 2021-02-01 DIAGNOSIS — M2041 Other hammer toe(s) (acquired), right foot: Secondary | ICD-10-CM

## 2021-02-01 DIAGNOSIS — M2011 Hallux valgus (acquired), right foot: Secondary | ICD-10-CM | POA: Diagnosis not present

## 2021-02-01 DIAGNOSIS — M21611 Bunion of right foot: Secondary | ICD-10-CM | POA: Diagnosis not present

## 2021-02-01 NOTE — Progress Notes (Signed)
  Subjective:  Patient ID: Linda [name], female    DOB: 1946/05/30,  MRN: [id number]  Chief Complaint  Patient presents with  . Routine Post Op    PT stated that she is doing well but she is still having constant pain that gets worse when she walks on her foot.    DOS: 01/18/2021 Procedure: Fifth hammertoe correction and partial syndactylization with excision of corn right foot  75 y.o. female returns for post-op check.  Pain is still there but is improving.  She inquires today about a painful bunion and a second toe that is also present, it hurts when she is in shoe gear and it rubs on the toes.  Review of Systems: Negative except as noted in the HPI. Denies N/V/F/Ch.   Objective:  There were no vitals filed for this visit. There is no height or weight on file to calculate BMI. Constitutional Well developed. Well nourished.  Vascular Foot warm and well perfused. Capillary refill normal to all digits.   Neurologic Normal speech. Oriented to person, place, and time. Epicritic sensation to light touch grossly present bilaterally.  Dermatologic Skin healing well without signs of infection. Skin edges well coapted without signs of infection.  Sutures intact  Orthopedic:  Moderate tenderness to palpation noted about the surgical site.   Radiographs: Consistent with postoperative changes of arthroplasty fifth toe Assessment:   1. Hammertoe of right foot    Plan:  Patient was evaluated and treated and all questions answered.  S/p foot surgery right -Progressing as expected post-operatively. -XR: Reviewed with patient -May resume regular shoe gear and activity, no tight shoes or going barefoot at this point.  Recommend she continue bathing, can apply lotion, no soaking for least 2 weeks.  Discussed the second hammertoe and the dorsal medial eminence over the bunion she has.  I reviewed her previous x-rays and she does not have significant increase intermetatarsal angle or  severe hallux valgus but there is a dorsomedial eminence.  I dispensed silicone bunion sleeves today to offload these areas while she is in shoes.  If this becomes more painful or persistent we will discuss other options including surgical correction.    Return in about 4 weeks (around 03/01/2021).

## 2021-02-08 ENCOUNTER — Encounter: Payer: Medicare Other | Admitting: Podiatry

## 2021-02-08 ENCOUNTER — Other Ambulatory Visit: Payer: Self-pay | Admitting: Cardiology

## 2021-02-08 ENCOUNTER — Other Ambulatory Visit: Payer: Self-pay | Admitting: Family Medicine

## 2021-02-08 DIAGNOSIS — I2510 Atherosclerotic heart disease of native coronary artery without angina pectoris: Secondary | ICD-10-CM

## 2021-02-08 DIAGNOSIS — E7800 Pure hypercholesterolemia, unspecified: Secondary | ICD-10-CM

## 2021-02-08 NOTE — Telephone Encounter (Signed)
Please advise if refill is appropriate 

## 2021-02-12 ENCOUNTER — Ambulatory Visit: Payer: Medicare Other | Admitting: Registered Nurse

## 2021-02-23 ENCOUNTER — Telehealth: Payer: Self-pay

## 2021-02-23 NOTE — Telephone Encounter (Signed)
Linda [name]  Key: [id number] -  Rx #: [id number] Drug ALPRAZolam 1MG  tablets Form [employer] Electronic PA Form (2017 NCPDP)  Outcome Additional Information Required Drug is covered by current benefit plan. No further PA activity needed

## 2021-03-01 ENCOUNTER — Other Ambulatory Visit: Payer: Self-pay

## 2021-03-01 ENCOUNTER — Ambulatory Visit (INDEPENDENT_AMBULATORY_CARE_PROVIDER_SITE_OTHER): Payer: Medicare Other | Admitting: Podiatry

## 2021-03-01 DIAGNOSIS — M2012 Hallux valgus (acquired), left foot: Secondary | ICD-10-CM

## 2021-03-01 DIAGNOSIS — M2011 Hallux valgus (acquired), right foot: Secondary | ICD-10-CM

## 2021-03-01 DIAGNOSIS — M21612 Bunion of left foot: Secondary | ICD-10-CM | POA: Diagnosis not present

## 2021-03-01 DIAGNOSIS — Z9889 Other specified postprocedural states: Secondary | ICD-10-CM

## 2021-03-01 DIAGNOSIS — L84 Corns and callosities: Secondary | ICD-10-CM

## 2021-03-01 DIAGNOSIS — M2042 Other hammer toe(s) (acquired), left foot: Secondary | ICD-10-CM

## 2021-03-01 DIAGNOSIS — M21621 Bunionette of right foot: Secondary | ICD-10-CM

## 2021-03-01 DIAGNOSIS — M2041 Other hammer toe(s) (acquired), right foot: Secondary | ICD-10-CM

## 2021-03-02 ENCOUNTER — Encounter: Payer: Self-pay | Admitting: Podiatry

## 2021-03-02 NOTE — Progress Notes (Signed)
  Subjective:  Patient ID: Linda [name], female    DOB: 04-26-1946,  MRN: [id number]  Chief Complaint  Patient presents with  . Routine Post Op    PT stated that she is doing well she does have some soreness and redness but no major concerns     DOS: 01/18/2021 Procedure: Fifth hammertoe correction and partial syndactylization with excision of corn right foot  75 y.o. female returns for post-op check.  She continues to improve.  She is having more pain with the second toe as well and inquires about surgery for this and what would that would entail.  Review of Systems: Negative except as noted in the HPI. Denies N/V/F/Ch.   Objective:  There were no vitals filed for this visit. There is no height or weight on file to calculate BMI. Constitutional Well developed. Well nourished.  Vascular Foot warm and well perfused. Capillary refill normal to all digits.   Neurologic Normal speech. Oriented to person, place, and time. Epicritic sensation to light touch grossly present bilaterally.  Dermatologic Skin is well-healed with nonhypertrophic scar.  She does have some erythema about the surgical site  Orthopedic:  Minimal tenderness to palpation noted about the surgical site.  She has hammertoe deformities with abduction at the DIPJ as well bilateral second as well as hallux valgus.  Tailor's bunion deformity bilaterally   Radiographs: Consistent with postoperative changes of arthroplasty fifth toe Assessment:   1. Heloma molle   2. Post-operative state   3. Hammertoe of right foot   4. Hammertoe of left foot   5. Hallux valgus with bunions, left   6. Hallux valgus with bunions, right   7. Tailor's bunion of both feet    Plan:  Patient was evaluated and treated and all questions answered.  S/p foot surgery right -Progressing as expected post-operatively. -I discussed with her the most the pain and erythema about the surgical site is still consistent with postoperative  changes, I think this will continue to improve.  Clinically to give more time to be further out from the surgery for [place] to know if this will be a long-term issue for her or not.  Today we again discussed the second hammertoe and the dorsal medial eminence over the bunion she has.  She also has pain over the fifth toe and tailor's bunion.  I dispensed a new tailor's bunion sleeves for this and she has been wearing the one for her hallux valgus deformity and it has been helpful.  Also gave her silicone toe caps to put over the second toe to keep it from rubbing.  Discussed surgical correction with arthrodesis with K wire fixation what this would entail.  I do think this would probably be a little bit more of a difficult recovery than we did for the syndactylization.  She had quite a bit of pain after the surgery has been persistent.  Discussed with her that this is likely secondary to her chronic pain issues and she has a heightened sensitization.  We will revisit this future would like her to be out several months from this last surgery before considering surgical intervention given  Return in about 3 months (around 06/01/2021) for recheck 2nd toes and toes on right foot.

## 2021-03-05 DIAGNOSIS — Z20828 Contact with and (suspected) exposure to other viral communicable diseases: Secondary | ICD-10-CM | POA: Diagnosis not present

## 2021-03-15 ENCOUNTER — Encounter: Payer: Medicare Other | Attending: Physical Medicine & Rehabilitation | Admitting: Registered Nurse

## 2021-03-15 ENCOUNTER — Other Ambulatory Visit: Payer: Self-pay

## 2021-03-15 ENCOUNTER — Encounter: Payer: Self-pay | Admitting: Registered Nurse

## 2021-03-15 VITALS — BP 141/89 | HR 73 | Temp 98.1°F | Ht 64.0 in | Wt 138.4 lb

## 2021-03-15 DIAGNOSIS — G8929 Other chronic pain: Secondary | ICD-10-CM

## 2021-03-15 DIAGNOSIS — Z5181 Encounter for therapeutic drug level monitoring: Secondary | ICD-10-CM

## 2021-03-15 DIAGNOSIS — Z79899 Other long term (current) drug therapy: Secondary | ICD-10-CM

## 2021-03-15 DIAGNOSIS — M1712 Unilateral primary osteoarthritis, left knee: Secondary | ICD-10-CM | POA: Diagnosis not present

## 2021-03-15 DIAGNOSIS — M7061 Trochanteric bursitis, right hip: Secondary | ICD-10-CM

## 2021-03-15 DIAGNOSIS — M542 Cervicalgia: Secondary | ICD-10-CM

## 2021-03-15 DIAGNOSIS — M797 Fibromyalgia: Secondary | ICD-10-CM | POA: Diagnosis not present

## 2021-03-15 DIAGNOSIS — M546 Pain in thoracic spine: Secondary | ICD-10-CM | POA: Insufficient documentation

## 2021-03-15 DIAGNOSIS — M1711 Unilateral primary osteoarthritis, right knee: Secondary | ICD-10-CM

## 2021-03-15 DIAGNOSIS — G894 Chronic pain syndrome: Secondary | ICD-10-CM | POA: Diagnosis not present

## 2021-03-15 DIAGNOSIS — M7062 Trochanteric bursitis, left hip: Secondary | ICD-10-CM | POA: Insufficient documentation

## 2021-03-15 DIAGNOSIS — M47816 Spondylosis without myelopathy or radiculopathy, lumbar region: Secondary | ICD-10-CM

## 2021-03-15 DIAGNOSIS — I2510 Atherosclerotic heart disease of native coronary artery without angina pectoris: Secondary | ICD-10-CM

## 2021-03-15 MED ORDER — OXYCODONE-ACETAMINOPHEN 10-325 MG PO TABS: 1.0000 | ORAL_TABLET | Freq: Four times a day (QID) | ORAL | 0 refills | Status: DC | PRN

## 2021-03-15 NOTE — Progress Notes (Signed)
Subjective:    Patient ID: Linda [name], female    DOB: 28-Sep-1946, 75 y.o.   MRN: [id number]  HPI: Linda [name] is a 75 y.o. female who returns for follow up appointment for chronic pain and medication refill. She states her pain is located in her neck, mid- lower back pain, bilateral hip pain R>L and bilateral knee pain. She rates her pain 7. Her current exercise regime is walking and performing stretching exercises.  Linda [name] Morphine equivalent is 50.00 MME.  UDS ordered today.    Pain Inventory Average Pain 7 Pain Right Now 7 My pain is constant, sharp and aching  In the last 24 hours, has pain interfered with the following? General activity 8 Relation with others 8 Enjoyment of life 7 What TIME of day is your pain at its worst? morning , daytime, evening and night Sleep (in general) Fair  Pain is worse with: walking, bending, standing and some activites Pain improves with: rest, medication and injections Relief from Meds: 5  Family History  Problem Relation Age of Onset  . Arthritis Mother   . Kidney disease Mother   . Heart disease Father   . Stroke Father   . Hypertension Father   . Diabetes Father   . Heart attack Father   . Heart attack Paternal Grandmother   . Diabetes Sister        one sister  . Hypertension Sister   . Asthma Sister   . Hypertension Brother   . Asthma Brother   . Asthma Daughter   . Multiple sclerosis Son    Social History   Socioeconomic History  . Marital status: Widowed    Spouse name: Not on file  . Number of children: 2  . Years of education: Not on file  . Highest education level: Not on file  Occupational History  . 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 Smoking status: Never Smoker  . Smokeless tobacco: Never Used  . Tobacco comment: never used tobacco  Vaping Use  . Vaping Use: Never used  Substance and Sexual Activity  . Alcohol use: No    Alcohol/week: 0.0 standard  drinks  . Drug use: No  . Sexual activity: Not Currently  Other Topics Concern  . Not on file  Social History Narrative   Widowed, 2 sons.  Relocated to [place] 09/2012 to be closer to her son who has MS.   Husband d 2015--mesothelioma.   Occupation: former Pharmacist, hospital.   Education: masters degree level.   No T/A/Ds.   Lives in [place], independent living.   Social Determinants of Health   Financial Resource Strain: Not on file  Food Insecurity: Not on file  Transportation Needs: Not on file  Physical Activity: Not on file  Stress: Not on file  Social Connections: Not on file   Past Surgical History:  Procedure Laterality Date  . APPENDECTOMY  1960  . AXILLARY SURGERY Left 1978   Multiple "lump" in armpit per pt  . BIOPSY  06/17/2020   Gastric bx->gastritis, h pylori neg.  Procedure: BIOPSY;  Surgeon: Carol Ada, MD;  Location: [facility];  Service: Endoscopy;;  . CARDIAC CATHETERIZATION  01/2013   nonobstructive CAD, EF 55-60%  . CARDIOVASCULAR STRESS TEST  02/22/15   Low risk myocard perf imaging; wall motion normal, normal EF  . carotid duplex doppler  10/21/2017   R vertebral flow suggestive of possible distal obstruction.  Pt  declined further w/u as of 10/29/17 but need to revisit this problem periodically.  . COCCYX REMOVAL  1972  . DEXA  06/05/2017   T-score -3.1  . DILATION AND CURETTAGE OF UTERUS  ? 1970's  . ESOPHAGOGASTRODUODENOSCOPY N/A 07/19/2014   Gastritis found + in the setting of supratherapeutic INR, +plavix, + meloxicam.  . ESOPHAGOGASTRODUODENOSCOPY (EGD) WITH PROPOFOL N/A 06/17/2020   NO stricture or other prob to explain pt's dysphagia, dilation was done anyway.  Gastric bx's-->gastritis, h pylori neg. Procedure: ESOPHAGOGASTRODUODENOSCOPY (EGD) WITH PROPOFOL;  Surgeon: Carol Ada, MD;  Location: [facility];  Service: Endoscopy;  Laterality: N/A;  . EYE SURGERY Left 2012-2013   "injections for ~ 1 yr; don't really know what for" (07/12/2013)  . HEEL  SPUR SURGERY Left 2008  . KNEE SURGERY  2005  . LEFT HEART CATHETERIZATION WITH CORONARY ANGIOGRAM N/A 01/30/2013   Procedure: LEFT HEART CATHETERIZATION WITH CORONARY ANGIOGRAM;  Surgeon: Clent Demark, MD;  Location[facility] LAB;  Service: Cardiovascular;  Laterality: N/A;  . MALONEY DILATION  06/17/2020   Procedure: Venia Minks DILATION;  Surgeon: Carol Ada, MD;  Location: [facility];  Service: Endoscopy;;  . PLANTAR FASCIA RELEASE Left 2008  . SPIROMETRY  04/25/14   In hosp for acute asthma/COPD flare: mixed obstructive and restrictive lung disease. The FEV1 is severely reduced at 45% predicted.  FEV1 signif decreased compared to prior spirometry 07/23/13.  . TENDON RELEASE  1996   Right forearm and hand  . TOTAL ABDOMINAL HYSTERECTOMY  1974  . TRANSTHORACIC ECHOCARDIOGRAM  01/2013; 04/2014;08/2015; 09/2017   2014--NORMAL.  2015--focal basal septal hypertrophy, EF 55-60%, grade I diast dysfxn, mild LAE.  08/2015 EF 55-60%, nl LV syst fxn, grade I DD, valves wnl. 10/21/17: EF 65-70%, grd I DD, o/w normal.   Past Surgical History:  Procedure Laterality Date  . APPENDECTOMY  1960  . AXILLARY SURGERY Left 1978   Multiple "lump" in armpit per pt  . BIOPSY  06/17/2020   Gastric bx->gastritis, h pylori neg.  Procedure: BIOPSY;  Surgeon: Carol Ada, MD;  Location: [facility];  Service: Endoscopy;;  . CARDIAC CATHETERIZATION  01/2013   nonobstructive CAD, EF 55-60%  . CARDIOVASCULAR STRESS TEST  02/22/15   Low risk myocard perf imaging; wall motion normal, normal EF  . carotid duplex doppler  10/21/2017   R vertebral flow suggestive of possible distal obstruction.  Pt declined further w/u as of 10/29/17 but need to revisit this problem periodically.  . COCCYX REMOVAL  1972  . DEXA  06/05/2017   T-score -3.1  . DILATION AND CURETTAGE OF UTERUS  ? 1970's  . ESOPHAGOGASTRODUODENOSCOPY N/A 07/19/2014   Gastritis found + in the setting of supratherapeutic INR, +plavix, + meloxicam.  .  ESOPHAGOGASTRODUODENOSCOPY (EGD) WITH PROPOFOL N/A 06/17/2020   NO stricture or other prob to explain pt's dysphagia, dilation was done anyway.  Gastric bx's-->gastritis, h pylori neg. Procedure: ESOPHAGOGASTRODUODENOSCOPY (EGD) WITH PROPOFOL;  Surgeon: Carol Ada, MD;  Location: [facility];  Service: Endoscopy;  Laterality: N/A;  . EYE SURGERY Left 2012-2013   "injections for ~ 1 yr; don't really know what for" (07/12/2013)  . HEEL SPUR SURGERY Left 2008  . KNEE SURGERY  2005  . LEFT HEART CATHETERIZATION WITH CORONARY ANGIOGRAM N/A 01/30/2013   Procedure: LEFT HEART CATHETERIZATION WITH CORONARY ANGIOGRAM;  Surgeon: Clent Demark, MD;  Location: [facility] CATH LAB;  Service: Cardiovascular;  Laterality: N/A;  . MALONEY DILATION  06/17/2020   Procedure: Venia Minks DILATION;  Surgeon: Carol Ada,  MD;  Location: [facility];  Service: Endoscopy;;  . PLANTAR FASCIA RELEASE Left 2008  . SPIROMETRY  04/25/14   In hosp for acute asthma/COPD flare: mixed obstructive and restrictive lung disease. The FEV1 is severely reduced at 45% predicted.  FEV1 signif decreased compared to prior spirometry 07/23/13.  . TENDON RELEASE  1996   Right forearm and hand  . TOTAL ABDOMINAL HYSTERECTOMY  1974  . TRANSTHORACIC ECHOCARDIOGRAM  01/2013; 04/2014;08/2015; 09/2017   2014--NORMAL.  2015--focal basal septal hypertrophy, EF 55-60%, grade I diast dysfxn, mild LAE.  08/2015 EF 55-60%, nl LV syst fxn, grade I DD, valves wnl. 10/21/17: EF 65-70%, grd I DD, o/w normal.   Past Medical History:  Diagnosis Date  . Acute upper GI bleed 06/2014   while pt taking coumadin, plavix, and meloxicam---despite being told not to take coumadin.  . Anginal pain ([place])    Nonobstructive CAD 2014; however, her cardiologist put her on a statin for this and NOT for hyperlipidemia per pt report.  Atyp CP 08/2017 at card f/u, plan for myoc perf imaging.  . Anxiety    panic attacks  . Asthma    w/ asbestososis   . BPPV (benign paroxysmal positional  vertigo) 12/16/2012  . Chronic diastolic CHF (congestive heart failure) (HCC)    dry wt as of 11/06/16 is 168 lbs.  . Chronic lower back pain   . COPD (chronic obstructive pulmonary disease) ([place])   . DDD (degenerative disc disease)    lumbar and cervical.   . Diverticular disease   . Fibromyalgia    Patient states dx was around her late 68s but she had sx's for years prior to this.  . H/O hiatal hernia   . History of pneumonia    hospitalized 12/2011, 02/2013, and 07/2013 [facility]) for this  . HTN (hypertension)    Renal artery dopplers 04/2013 neg for stenosis.  . Hypervitaminosis D 09/27/2019   over-supplemented.  Stopped vit D and plan recheck 2 mo.  . Idiopathic angio-edema-urticaria [id number]; 2021   Angioedema component was very minimal.  2021->Dr. Bobbitt (allergist) eval.  . Insomnia   . Iron deficiency anemia    Hematologist in [place], [place] did extensive w/u; no cause found; failed oral supplement;; gets fairly regular (q15m or so) IV iron infusions (Venofer -iron sucrose- 200mg  with procrit.  "for 14 yr I've been getting blood work q month & getting infusions prn" (07/12/2013).  Dr. Marin Olp locally, iron infusions done, EPO deficiency dx'd  . Migraine syndrome    "not as often anymore; used to be ~ q wk" (07/12/2013)  . Mixed incontinence urge and stress   . Nephrolithiasis    "passed all on my own or they are still in there" (07/12/2013)  . Neuroleptic induced parkinsonism ([place]) 2018   Dr. Carles Collet, neuro, saw her 11/24/17 and recommended d/c of abilify as first step.  D/c'd abilify and pt got complete recovery.  . Oropharyngeal dysphagia    swallowing study speech path 05/2020. Gastric bx's showed gastritis, h pylori NEG  . OSA on CPAP    prior to move to [place]--had another sleep study 10/2015 w/pulm Dr. Camillo Flaming.  . Osteoarthritis    "severe; progressing fast" (07/12/2013); multiple joints-not surgical candidate for TKR (03/2015).  Triamcinolon knee injections by Dr. Tessa Lerner 12/2017.  [name] Kitchen  Pernicious anemia 08/24/2014  . Pleural plaque with presence of asbestos 07/22/2013  . Pulmonary embolism ([place]) 07/2013   Dx at [facility] with very small peripheral upper lobe pe 07/2013: pt took  coumadin for about 8-9 mo  . Pyelonephritis    "several times over the last 30 yr" (07/12/2013)  . RBBB (right bundle branch block)   . Recurrent major depression ([place])   . Recurrent UTI    hx of hospitalization for pyelonephritis; started abx prophylaxis 06/2015  . Syncope    Hypotensive; ED visit--Dr. Terrence Dupont did Cath--nonobstructive CAD, EF 55-60%.  In retrospect, suspect pt rx med misuse/polypharmacy   There were no vitals taken for this visit.  Opioid Risk Score:   Fall Risk Score:  `1  Depression screen PHQ 2/9  Depression screen [facility] 2/9 12/13/2020 11/01/2020 09/29/2020 07/24/2020 04/26/2020 09/27/2019 02/24/2019  Decreased Interest 1 1 0 1 1 1 1   Down, Depressed, Hopeless 1 1 0 1 1 2 1   PHQ - 2 Score 2 2 0 2 2 3 2   Altered sleeping - - 1 - - 1 -  Tired, decreased energy - - 3 - - 1 -  Change in appetite - - 3 - - 0 -  Feeling bad or failure about yourself  - - 0 - - 0 -  Trouble concentrating - - 3 - - 1 -  Moving slowly or fidgety/restless - - 0 - - 0 -  Suicidal thoughts - - 0 - - 0 -  PHQ-9 Score - - 10 - - 6 -  Difficult doing work/chores - - - - - Not difficult at all -  Some recent data might be hidden   Review of Systems  Musculoskeletal: Positive for back pain, gait problem and neck pain.       Pain in the neck, hips, spine, back, right knee  All other systems reviewed and are negative.      Objective:   Physical Exam Vitals and nursing note reviewed.  Constitutional:      Appearance: Normal appearance.  Neck:     Comments: Cervical Paraspinal Tenderness: C-5-C-6 Cardiovascular:     Rate and Rhythm: Normal rate and regular rhythm.     Pulses: Normal pulses.     Heart sounds: Normal heart sounds.  Pulmonary:     Effort: Pulmonary effort is normal.     Breath sounds: Normal  breath sounds.  Musculoskeletal:     Cervical back: Normal range of motion and neck supple.     Comments: Normal Muscle Bulk and Muscle Testing Reveals:  Upper Extremities: Full ROM and Muscle Strength 5/5  Bilateral AC Joint Tenderness  Thoracic Paraspinal Tenderness: T-7-T-9 Lumbar Paraspinal Tenderness: L-3-L-5 Lower Extremities: Full ROM and Muscle Strength 5/5 Arises from chair slowly Antalgic Linda [name]   Skin:    General: Skin is warm and dry.  Neurological:     Mental Status: She is alert and oriented to person, place, and time.  Psychiatric:        Mood and Affect: Mood normal.        Behavior: Behavior normal.           Assessment & Plan:  1. Functional deficits secondary to Gait disorder:Continue with HEP.03/15/2021. 2. Chronic Back pain/ Lumbar Radiculitis/fibromyalgia /R>L Knee OA Pain Management:03/15/2021 Refilled: Oxycodone 10/325mg one tablet every 6 hours #100. Second script sent to accommodate scheduled appointment with [name].  ContinueVoltaren Gel. We will continue the opioid monitoring program, this consists of regular clinic visits, examinations, urine drug screen, pill counts as well as use of [place] Controlled Substance Reporting system. A 12 month History has been reviewed on the [place] Controlled Substance Reporting System on 03/15/2021 3.  Depression with anxiety/Grief reaction/Mood:03/15/2021 Continue Xanax,Trazodone and Cymbalta . PCP Following. 4. [name] with asthma:. Albuterol prn. :Pulmonology Following.03/15/2021 5. Bilateral Osteoarthritis of Bilateral Knees:Linda [name] Requesting Bilateral Zilretta Injection, she will be scheduled with [name] for Bilateral Zilretta Injection with [name], she verbalizes understanding.S/PBilateralZilretta Injections on02/02/2022with good relief noted.Continue to Monitor.03/15/2021 6. Fibromyalgia: Continue Home exercise regimen as tolerated. Continue to Monitor.03/15/2021 7.  Cervicalgia/ Cervical Radiculitis:Continue HEP as tolerated. Alternate with heat and ice therapy. Continue to monitor.03/15/2021 8.BilateralGreater Trochanteric Bursitis:Continue toAlternate Ice and Heat Therapy. Continue to Monitor.03/15/2021. 9. Chronic Bilateral Shoulder Pain:No complaints Today. Continue HEP as Tolerated. Continue current medication regimen. Continue to Monitor.03/15/2021  F/U in 2 months

## 2021-03-19 ENCOUNTER — Ambulatory Visit: Payer: Medicare Other | Admitting: Registered Nurse

## 2021-03-19 DIAGNOSIS — Z20828 Contact with and (suspected) exposure to other viral communicable diseases: Secondary | ICD-10-CM | POA: Diagnosis not present

## 2021-03-21 LAB — TOXASSURE SELECT,+ANTIDEPR,UR

## 2021-03-26 ENCOUNTER — Other Ambulatory Visit: Payer: Self-pay

## 2021-03-26 MED ORDER — DONEPEZIL HCL 10 MG PO TABS: 10.0000 mg | ORAL_TABLET | Freq: Every day | ORAL | 0 refills | Status: DC

## 2021-03-30 ENCOUNTER — Telehealth: Payer: Self-pay | Admitting: *Deleted

## 2021-03-30 NOTE — Telephone Encounter (Signed)
Urine drug screen for this encounter is consistent for prescribed medication 

## 2021-04-10 ENCOUNTER — Other Ambulatory Visit: Payer: Self-pay | Admitting: Family Medicine

## 2021-04-10 ENCOUNTER — Other Ambulatory Visit: Payer: Self-pay

## 2021-04-10 MED ORDER — METOPROLOL SUCCINATE ER 100 MG PO TB24: ORAL_TABLET | ORAL | 3 refills | Status: DC

## 2021-04-11 ENCOUNTER — Ambulatory Visit: Payer: Medicare Other | Admitting: Family Medicine

## 2021-04-11 ENCOUNTER — Ambulatory Visit: Payer: Medicare Other

## 2021-04-12 ENCOUNTER — Other Ambulatory Visit: Payer: Self-pay | Admitting: Family Medicine

## 2021-04-12 ENCOUNTER — Other Ambulatory Visit: Payer: Self-pay | Admitting: Cardiology

## 2021-04-13 ENCOUNTER — Other Ambulatory Visit: Payer: Self-pay | Admitting: Family Medicine

## 2021-04-13 ENCOUNTER — Other Ambulatory Visit: Payer: Self-pay

## 2021-04-13 MED ORDER — METOPROLOL SUCCINATE ER 100 MG PO TB24: ORAL_TABLET | ORAL | 1 refills | Status: DC

## 2021-04-18 ENCOUNTER — Ambulatory Visit (INDEPENDENT_AMBULATORY_CARE_PROVIDER_SITE_OTHER): Payer: Medicare Other | Admitting: Family Medicine

## 2021-04-18 ENCOUNTER — Other Ambulatory Visit: Payer: Self-pay | Admitting: Family Medicine

## 2021-04-18 ENCOUNTER — Encounter: Payer: Self-pay | Admitting: Family Medicine

## 2021-04-18 ENCOUNTER — Other Ambulatory Visit: Payer: Self-pay

## 2021-04-18 VITALS — BP 106/59 | HR 82 | Temp 97.5°F | Resp 16 | Ht 64.0 in | Wt 137.0 lb

## 2021-04-18 DIAGNOSIS — Z8639 Personal history of other endocrine, nutritional and metabolic disease: Secondary | ICD-10-CM

## 2021-04-18 DIAGNOSIS — F3342 Major depressive disorder, recurrent, in full remission: Secondary | ICD-10-CM

## 2021-04-18 DIAGNOSIS — M797 Fibromyalgia: Secondary | ICD-10-CM | POA: Diagnosis not present

## 2021-04-18 DIAGNOSIS — Z79899 Other long term (current) drug therapy: Secondary | ICD-10-CM | POA: Diagnosis not present

## 2021-04-18 DIAGNOSIS — N3941 Urge incontinence: Secondary | ICD-10-CM | POA: Diagnosis not present

## 2021-04-18 DIAGNOSIS — I2510 Atherosclerotic heart disease of native coronary artery without angina pectoris: Secondary | ICD-10-CM | POA: Diagnosis not present

## 2021-04-18 DIAGNOSIS — F411 Generalized anxiety disorder: Secondary | ICD-10-CM

## 2021-04-18 DIAGNOSIS — I10 Essential (primary) hypertension: Secondary | ICD-10-CM

## 2021-04-18 LAB — BASIC METABOLIC PANEL
BUN: 18 mg/dL (ref 6–23)
CO2: 28 mEq/L (ref 19–32)
Calcium: 8.8 mg/dL (ref 8.4–10.5)
Chloride: 103 mEq/L (ref 96–112)
Creatinine, Ser: 0.86 mg/dL (ref 0.40–1.20)
GFR: 66.24 mL/min (ref >60.00)
Glucose, Bld: 77 mg/dL (ref 70–99)
Potassium: 4.3 mEq/L (ref 3.5–5.1)
Sodium: 138 mEq/L (ref 135–145)

## 2021-04-18 MED ORDER — SOLIFENACIN SUCCINATE 10 MG PO TABS: 10.0000 mg | ORAL_TABLET | Freq: Every day | ORAL | 3 refills | Status: DC

## 2021-04-18 NOTE — Progress Notes (Signed)
OFFICE VISIT  04/18/2021  CC:  Chief Complaint  Patient presents with  . Follow-up    RCI, 3 month. Not fasting    HPI:    Patient is a 75 y.o. Caucasian female who presents for 3 mo f/u HTN, MCI, recurrent MDD/GAD, urge incontinence. She has COPD/pulm fibrosis/asbestosis (with hx of chronic hypoxic RF), chronic diastolic HF (also hx of nonobstructive CAD for which she takes statin), and chronic anxiety with treatment resistant depression. She also has chronic pain syndrome, gets fairly regular interventional treatments and chronically takes opioid pain medication (Dr. Ephriam Knuckles). Polypharmacy issues have long been an issue regarding her overall wellbeing and medical care.  A/P as of last visit: "1) Uncontrolled HTN: increase toprol xl 100mg  to 1 and 1/2 tabs qd. Lytes/cr normal 1 mo ago.  2) Urge incontinence: vesicare effective but only short time period. Will try Myrbetriq 25mg  qd. Consider inc dose to 50mg  at f/u in 2 wks.  3) Recurrent UTI vs chronic atypical urge incont sx's: UA abnormal today. Will start cefdinir 300mg  bid x 5d and send urine for c/s.  4) Recurrent/treatment resistant MDD + chronic anxiety: all stable. Cont lamictal 150mg  qd, xanax 1mg  tid, and dulox 90 qd.  5) Chronic hypoxic resp failure: copd +pulm fibrosis/asbestosis. Stable, cont oxygen prn daytime + nocturnal, albut prn. Per pt request will refer to [facility] b/c she wishes to transfer care from Dr. Camillo Flaming to their practice.  6) MCI: unclear benefit from aricept at this time but ok to continue for now.  7) HLD, nonosbt CAD: she is fasting today so we'll check lipid panel today. Cont rosuva 20mg  qd."  INTERIM HX: Doing ok. Has chronic issues with low back pain and knees. Lately more bothered by lower legs painful.  Deep constant ache.    F/u shortly after last visit showed that she responded well to myrbetriq. Now says myrbetriq doesn't last long enough and is too expensive and she asks  to go back on vesicare.  Also, due to some labile HTN (w/ occas random low bp) we decided to keep her toprol xl at 100 mg qd with option of taking additional 1/2 tab prn elevated bp. Home bp consistently around 130/80.  Not having to take extra 1/2 of toprol. Not requiring fluid pill at all.  HLD: tolerates rosuva 20mg  qd, LDL 3 mo ago was 74, HDL 58, trigs 65.  PMP AWARE reviewed today: most recent rx for alprazolam 1mg  was filled 03/22/21, # 59, rx by me. No red flags.   Past Medical History:  Diagnosis Date  . Acute upper GI bleed 06/2014   while pt taking coumadin, plavix, and meloxicam---despite being told not to take coumadin.  . Anginal pain ([place])    Nonobstructive CAD 2014; however, her cardiologist put her on a statin for this and NOT for hyperlipidemia per pt report.  Atyp CP 08/2017 at card f/u, plan for myoc perf imaging.  . Anxiety    panic attacks  . Asthma    w/ asbestososis   . BPPV (benign paroxysmal positional vertigo) 12/16/2012  . Chronic diastolic CHF (congestive heart failure) (HCC)    dry wt as of 11/06/16 is 168 lbs.  . Chronic lower back pain   . COPD (chronic obstructive pulmonary disease) ([place])   . DDD (degenerative disc disease)    lumbar and cervical.   . Diverticular disease   . Fibromyalgia    Patient states dx was around her late 56s but she had sx's  for years prior to this.  . H/O hiatal hernia   . History of pneumonia    hospitalized 12/2011, 02/2013, and 07/2013 [facility]) for this  . HTN (hypertension)    Renal artery dopplers 04/2013 neg for stenosis.  . Hypervitaminosis D 09/27/2019   over-supplemented.  Stopped vit D and plan recheck 2 mo.  . Idiopathic angio-edema-urticaria [id number]; 2021   Angioedema component was very minimal.  2021->Dr. Bobbitt (allergist) eval.  . Insomnia   . Iron deficiency anemia    Hematologist in [place], [place] did extensive w/u; no cause found; failed oral supplement;; gets fairly regular (q34m or so) IV iron infusions  (Venofer -iron sucrose- 200mg  with procrit.  "for 14 yr I've been getting blood work q month & getting infusions prn" (07/12/2013).  Dr. Marin Olp locally, iron infusions done, EPO deficiency dx'd  . Migraine syndrome    "not as often anymore; used to be ~ q wk" (07/12/2013)  . Mixed incontinence urge and stress   . Nephrolithiasis    "passed all on my own or they are still in there" (07/12/2013)  . Neuroleptic induced parkinsonism ([place]) 2018   Dr. Carles Collet, neuro, saw her 11/24/17 and recommended d/c of abilify as first step.  D/c'd abilify and pt got complete recovery.  . Oropharyngeal dysphagia    swallowing study speech path 05/2020. Gastric bx's showed gastritis, h pylori NEG  . OSA on CPAP    prior to move to [place]--had another sleep study 10/2015 w/pulm Dr. Camillo Flaming.  . Osteoarthritis    "severe; progressing fast" (07/12/2013); multiple joints-not surgical candidate for TKR (03/2015).  Triamcinolon knee injections by Dr. Tessa Lerner 12/2017.  [name] Kitchen Pernicious anemia 08/24/2014  . Pleural plaque with presence of asbestos 07/22/2013  . Pulmonary embolism ([place]) 07/2013   Dx at [facility] with very small peripheral upper lobe pe 07/2013: pt took coumadin for about 8-9 mo  . Pyelonephritis    "several times over the last 30 yr" (07/12/2013)  . RBBB (right bundle branch block)   . Recurrent major depression ([place])   . Recurrent UTI    hx of hospitalization for pyelonephritis; started abx prophylaxis 06/2015  . Syncope    Hypotensive; ED visit--Dr. Terrence Dupont did Cath--nonobstructive CAD, EF 55-60%.  In retrospect, suspect pt rx med misuse/polypharmacy    Past Surgical History:  Procedure Laterality Date  . APPENDECTOMY  1960  . AXILLARY SURGERY Left 1978   Multiple "lump" in armpit per pt  . BIOPSY  06/17/2020   Gastric bx->gastritis, h pylori neg.  Procedure: BIOPSY;  Surgeon: Carol Ada, MD;  Location: [facility];  Service: Endoscopy;;  . CARDIAC CATHETERIZATION  01/2013   nonobstructive CAD, EF 55-60%  . CARDIOVASCULAR  STRESS TEST  02/22/15   Low risk myocard perf imaging; wall motion normal, normal EF  . carotid duplex doppler  10/21/2017   R vertebral flow suggestive of possible distal obstruction.  Pt declined further w/u as of 10/29/17 but need to revisit this problem periodically.  . COCCYX REMOVAL  1972  . DEXA  06/05/2017   T-score -3.1  . DILATION AND CURETTAGE OF UTERUS  ? 1970's  . ESOPHAGOGASTRODUODENOSCOPY N/A 07/19/2014   Gastritis found + in the setting of supratherapeutic INR, +plavix, + meloxicam.  . ESOPHAGOGASTRODUODENOSCOPY (EGD) WITH PROPOFOL N/A 06/17/2020   NO stricture or other prob to explain pt's dysphagia, dilation was done anyway.  Gastric bx's-->gastritis, h pylori neg. Procedure: ESOPHAGOGASTRODUODENOSCOPY (EGD) WITH PROPOFOL;  Surgeon: Carol Ada, MD;  Location: [facility];  Service: Endoscopy;  Laterality: N/A;  . EYE SURGERY Left 2012-2013   "injections for ~ 1 yr; don't really know what for" (07/12/2013)  . HEEL SPUR SURGERY Left 2008  . KNEE SURGERY  2005  . LEFT HEART CATHETERIZATION WITH CORONARY ANGIOGRAM N/A 01/30/2013   Procedure: LEFT HEART CATHETERIZATION WITH CORONARY ANGIOGRAM;  Surgeon: Clent Demark, MD;  Location: [facility] C[facility] LAB;  Service: Cardiovascular;  Laterality: N/A;  . MALONEY DILATION  06/17/2020   Procedure: Venia Minks DILATION;  Surgeon: Carol Ada, MD;  Location: [facility];  Service: Endoscopy;;  . PLANTAR FASCIA RELEASE Left 2008  . SPIROMETRY  04/25/14   In hosp for acute asthma/COPD flare: mixed obstructive and restrictive lung disease. The FEV1 is severely reduced at 45% predicted.  FEV1 signif decreased compared to prior spirometry 07/23/13.  . TENDON RELEASE  1996   Right forearm and hand  . TOTAL ABDOMINAL HYSTERECTOMY  1974  . TRANSTHORACIC ECHOCARDIOGRAM  01/2013; 04/2014;08/2015; 09/2017   2014--NORMAL.  2015--focal basal septal hypertrophy, EF 55-60%, grade I diast dysfxn, mild LAE.  08/2015 EF 55-60%, nl LV syst fxn, grade I DD, valves wnl.  10/21/17: EF 65-70%, grd I DD, o/w normal.    Outpatient Medications Prior to Visit  Medication Sig Dispense Refill  . albuterol (VENTOLIN HFA) 108 (90 Base) MCG/ACT inhaler Inhale 1-2 puffs into the lungs every 6 (six) hours as needed for wheezing or shortness of breath. 18 g 1  . alendronate (FOSAMAX) 70 MG tablet Take 1 tablet (70 mg total) by mouth every 7 (seven) days. Take with a full glass of water on an empty stomach, first thing in the morning and remain up right for 30 minutes after taking. 12 tablet 3  . ALPRAZolam (XANAX) 1 MG tablet TAKE 1 TABLET THREE TIMES DAILY AS NEEDED FOR ANXIETY. 90 tablet 5  . aspirin 81 MG tablet Take 81 mg by mouth at bedtime.    . budesonide-formoterol (SYMBICORT) 80-4.5 MCG/ACT inhaler 2 puffs    . cyclobenzaprine (FLEXERIL) 5 MG tablet Take 1 tablet (5 mg total) by mouth 3 (three) times daily as needed for muscle spasms. 60 tablet 3  . docusate sodium (COLACE) 100 MG capsule Take 1 capsule (100 mg total) by mouth 2 (two) times daily. 10 capsule 0  . donepezil (ARICEPT) 10 MG tablet Take 1 tablet (10 mg total) by mouth at bedtime. 30 tablet 0  . DULoxetine (CYMBALTA) 30 MG capsule TAKE 1 CAPSULE ONCE DAILY ALONG WITH 60MG  CAPSULE FOR TOTAL 90MG  DAILY. (Patient taking differently: Take 30 mg by mouth See admin instructions.) 90 capsule 3  . DULoxetine (CYMBALTA) 60 MG capsule TAKE 1 CAPSULE A DAY TO BE COMBINED WITH 30MG  CAPSULE (Patient taking differently: Take 60 mg by mouth See admin instructions.) 90 capsule 3  . fluconazole (DIFLUCAN) 100 MG tablet TAKE (1) TABLET DAILY AS NEEDED FOR THRUSH 15 tablet 3  . fluticasone (FLONASE) 50 MCG/ACT nasal spray USE 2 SPRAYS IN EACH NOSTRIL ONCE DAILY 16 g 5  . furosemide (LASIX) 40 MG tablet Take 1 tablet (40 mg total) by mouth daily as needed for fluid.    [name] Kitchen ipratropium (ATROVENT) 0.03 % nasal spray Place 2 sprays into both nostrils every 12 (twelve) hours. 30 mL 11  . isosorbide mononitrate (IMDUR) 30 MG 24 hr  tablet TAKE 2 TABLETS IN THE AM AND 1 TABLET IN THE PM. 270 tablet 1  . lamoTRIgine (LAMICTAL) 150 MG tablet TAKE 1 TABLET EACH DAY. 90 tablet 3  . metoprolol succinate (TOPROL-XL)  100 MG 24 hr tablet 1 tab po qd, take additional 1/2 tab as needed for bp > 160/90 135 tablet 1  . metroNIDAZOLE (METROGEL) 0.75 % gel Apply 1 application topically at bedtime.    . Multiple Vitamin (MULTIVITAMIN WITH MINERALS) TABS tablet Take 1 tablet by mouth daily.    . nitroGLYCERIN (NITROSTAT) 0.4 MG SL tablet Place 1 tablet (0.4 mg total) under the tongue every 5 (five) minutes as needed for chest pain (x 3 doses). 25 tablet 11  . ondansetron (ZOFRAN) 8 MG tablet Take 1 tablet (8 mg total) by mouth every 8 (eight) hours as needed for nausea or vomiting. 30 tablet 6  . oxyCODONE-acetaminophen (PERCOCET) 10-325 MG tablet Take 1 tablet by mouth every 6 (six) hours as needed for pain. Do Not Fill Before 04/16/21 100 tablet 0  . pantoprazole (PROTONIX) 40 MG tablet TAKE 1 TABLET BY MOUTH TWICE DAILY. (Patient taking differently: Take 40 mg by mouth daily.) 180 tablet 0  . rosuvastatin (CRESTOR) 20 MG tablet TAKE 1 TABLET ONCE DAILY. 90 tablet 3  . traZODone (DESYREL) 50 MG tablet Take by mouth. 2-4 tablets at bedtime    . mirabegron ER (MYRBETRIQ) 25 MG TB24 tablet Take 1 tablet (25 mg total) by mouth daily. 90 tablet 3  . EPINEPHrine 0.3 mg/0.3 mL IJ SOAJ injection Inject 0.3 mg into the muscle as needed for anaphylaxis. (Patient not taking: Reported on 04/18/2021) 1 each 1  . mupirocin ointment (BACTROBAN) 2 % Apply 1 application topically 2 (two) times daily. Can be ointment or cream- whichever on formulary and cheapest for her (Patient not taking: Reported on 04/18/2021) 22 g 0  . calcium citrate-vitamin D (CITRACAL+D) 315-200 MG-UNIT tablet 1 tablet (Patient not taking: Reported on 04/18/2021)    . cholecalciferol (VITAMIN D3) 25 MCG (1000 UNIT) tablet 1 tablet (Patient not taking: Reported on 04/18/2021)     No  facility-administered medications prior to visit.    Allergies  Allergen Reactions  . Abilify [Aripiprazole] Other (See Comments)    parkinsonism  . Penicillins Itching, Swelling and Rash    Tolerated Cefepime in ED. Has patient had a PCN reaction causing immediate rash, facial/tongue/throat swelling, SOB or lightheadedness with hypotension: Yes Has patient had a PCN reaction causing severe rash involving mucus membranes or skin necrosis: No Has patient had a PCN reaction that required hospitalization: No  Has patient had a PCN reaction occurring within the last 10 years: No     ROS As per HPI  PE: Vitals with BMI 04/18/2021 03/15/2021 01/24/2021  Height 5\' 4"  5\' 4"  5\' 4"   Weight 137 lbs 138 lbs 6 oz 135 lbs  BMI 23.5 35.32 99.24  Systolic 268 341 962  Diastolic 59 89 78  Pulse 82 73 69  O2 sat 94% on RA today  Gen: Alert, well appearing.  Patient is oriented to person, place, time, and situation. AFFECT: pleasant, lucid thought and speech. She has diffuse TTP of soft tissues of neck, upper and mid back, thighs, and generalized over lower legs. No joint swelling or erythema.  LABS:  Lab Results  Component Value Date   TSH 2.750 10/04/2019   Lab Results  Component Value Date   WBC 7.2 11/25/2020   HGB 13.3 11/25/2020   HCT 41.5 11/25/2020   MCV 89.4 11/25/2020   PLT 184 11/25/2020   Lab Results  Component Value Date   CREATININE 0.65 11/25/2020   BUN 6 (L) 11/25/2020   NA 141 11/25/2020  K 3.2 (L) 11/25/2020   CL 103 11/25/2020   CO2 25 11/25/2020   Lab Results  Component Value Date   ALT 13 11/24/2020   AST 18 11/24/2020   ALKPHOS 58 11/24/2020   BILITOT 0.3 11/24/2020   Lab Results  Component Value Date   CHOL 145 12/28/2020   Lab Results  Component Value Date   HDL 58.20 12/28/2020   Lab Results  Component Value Date   LDLCALC 74 12/28/2020   Lab Results  Component Value Date   TRIG 65.0 12/28/2020   Lab Results  Component Value Date    CHOLHDL 2 12/28/2020   Lab Results  Component Value Date   HGBA1C 5.6 01/02/2016   IMPRESSION AND PLAN:  1) HTN: stable.  Cont imdur 60mg  qAM and 30mg  qpm + toprol xl 100mg  qd.  Can take extra 1/2 of toprol prn bp >160/100. bmet today (hx hypokalemia).  2) Urge incontinence: not satisfied with response to myrbetriq + too costly. She wants to switch back to vesicare 10mg  qd, which she unfortunately also only had mild/mod success with and it doesn't last that long for her.  3) Recurrent MDD, hx of treatment resistant: in remission. Cont lamictal 150mg  qd and cymbalta 90 mg qd.  4) GAD: stable on cymbalta and scheduled tid xanax 1mg . CSC UTD.  No new rx for alprazolam was needed today.  4) Widespread pain syndrome: multifactorial-->component of fibromyalgia + extensive [name]. I told her that I think the risks outweigh the benefits for her regarding any trial of med such as gabapentin or lyrica (medication interaction potential, hx of oversedation/polypharmacy in the past).  Unfortunately I have nothing new to offer her today regarding her pain.  5) Preventative health care: Due for screening mammogram (last was 2018) and f/u DEXA (last was 2018) but she wants to defer these for now.  6) Chronic hypoxic resp failure: copd +pulm fibrosis/asbestosis. Stable, cont symbicort, oxygen prn daytime + nocturnal, albut prn.  7) HLD, nonosbt CAD: LDL goal 70.  Tolerating rosuva 20mg  qd. LDL was 74 three months ago. Plan repeat 3-6 mo.  An After Visit Summary was printed and given to the patient.  FOLLOW UP: Return in about 3 months (around 07/18/2021) for routine chronic illness f/u.  Signed:  Crissie Sickles, MD           04/18/2021

## 2021-04-23 DIAGNOSIS — Z20828 Contact with and (suspected) exposure to other viral communicable diseases: Secondary | ICD-10-CM | POA: Diagnosis not present

## 2021-04-30 DIAGNOSIS — Z20828 Contact with and (suspected) exposure to other viral communicable diseases: Secondary | ICD-10-CM | POA: Diagnosis not present

## 2021-05-02 ENCOUNTER — Other Ambulatory Visit: Payer: Self-pay | Admitting: Family Medicine

## 2021-05-07 DIAGNOSIS — Z20828 Contact with and (suspected) exposure to other viral communicable diseases: Secondary | ICD-10-CM | POA: Diagnosis not present

## 2021-05-09 ENCOUNTER — Other Ambulatory Visit: Payer: Self-pay

## 2021-05-09 ENCOUNTER — Encounter: Payer: Self-pay | Admitting: Physical Medicine & Rehabilitation

## 2021-05-09 ENCOUNTER — Encounter: Payer: Medicare Other | Attending: Physical Medicine & Rehabilitation | Admitting: Physical Medicine & Rehabilitation

## 2021-05-09 VITALS — BP 128/65 | HR 64

## 2021-05-09 DIAGNOSIS — M170 Bilateral primary osteoarthritis of knee: Secondary | ICD-10-CM | POA: Diagnosis not present

## 2021-05-09 DIAGNOSIS — M1711 Unilateral primary osteoarthritis, right knee: Secondary | ICD-10-CM | POA: Diagnosis present

## 2021-05-09 DIAGNOSIS — M1712 Unilateral primary osteoarthritis, left knee: Secondary | ICD-10-CM | POA: Diagnosis not present

## 2021-05-09 MED ORDER — OXYCODONE-ACETAMINOPHEN 10-325 MG PO TABS: 1.0000 | ORAL_TABLET | Freq: Four times a day (QID) | ORAL | 0 refills | Status: DC | PRN

## 2021-05-09 NOTE — Patient Instructions (Addendum)
PLEASE FEEL FREE TO CALL OUR OFFICE WITH ANY PROBLEMS OR QUESTIONS ([phone])  CHECK WITH YOUR DOCTORS ABOUT TAKING AN OCCASIONAL IBUPROFEN  YOU CAN TAKE TYLENOL UP TO 2500MG  DAILY AS WELL.

## 2021-05-09 NOTE — Progress Notes (Signed)
PROCEDURE NOTE  DIAGNOSIS:  endstage OA bilateral knees  INTERVENTION:   ZILRETTA INJECTION     After informed consent and preparation of the skin with betadine and isopropyl alcohol, I injected 32MG  of zilretta dissolved into 5cc of diluent into the bilateral knees via anterolateral approach. Contents of syring were shaken vigorously and aspiration was performed prior to injection. The patient tolerated well, and no complications were encountered. Afterward the area was cleaned and dressed. Post- injection instructions were provided including ice  if swelling or pain should occur.    Meredith Staggers, MD, [facility] 05/09/2021

## 2021-05-11 ENCOUNTER — Telehealth: Payer: Self-pay

## 2021-05-11 DIAGNOSIS — M1711 Unilateral primary osteoarthritis, right knee: Secondary | ICD-10-CM

## 2021-05-11 DIAGNOSIS — M1712 Unilateral primary osteoarthritis, left knee: Secondary | ICD-10-CM

## 2021-05-11 MED ORDER — CYCLOBENZAPRINE HCL 5 MG PO TABS: 5.0000 mg | ORAL_TABLET | Freq: Two times a day (BID) | ORAL | 3 refills | Status: DC | PRN

## 2021-05-11 MED ORDER — DICLOFENAC SODIUM 1 % EX GEL: 2.0000 g | Freq: Four times a day (QID) | CUTANEOUS | 4 refills | Status: DC

## 2021-05-11 NOTE — Telephone Encounter (Signed)
Placed a call to Linda [name], she is using the Flexeril twice a day as needed and need a refill on Voltaren gel was sent to her pharmacy. [name] understanding.

## 2021-05-11 NOTE — Telephone Encounter (Signed)
Patient called requesting refill for Flexeril and Voltaren Gel but I don't see in note where she was prescription Voltaren Gel.

## 2021-05-14 DIAGNOSIS — Z20828 Contact with and (suspected) exposure to other viral communicable diseases: Secondary | ICD-10-CM | POA: Diagnosis not present

## 2021-05-16 ENCOUNTER — Other Ambulatory Visit: Payer: Self-pay | Admitting: Family Medicine

## 2021-05-16 NOTE — Telephone Encounter (Signed)
RF request for donepezil LOV: 04/18/21 Next ov: 07/16/21 Last written: 03/26/21 (30,0)  Requesting: alprazolam Contract: 12/28/20 UDS: 07/24/20, Dr. Marcello Moores Last Visit:04/18/21 Next Visit:07/16/21 Last Refill: 11/27/20( 90,5)  Please Advise. Meds pending

## 2021-05-21 DIAGNOSIS — Z20828 Contact with and (suspected) exposure to other viral communicable diseases: Secondary | ICD-10-CM | POA: Diagnosis not present

## 2021-05-28 DIAGNOSIS — Z20828 Contact with and (suspected) exposure to other viral communicable diseases: Secondary | ICD-10-CM | POA: Diagnosis not present

## 2021-05-31 DIAGNOSIS — Z20828 Contact with and (suspected) exposure to other viral communicable diseases: Secondary | ICD-10-CM | POA: Diagnosis not present

## 2021-06-04 DIAGNOSIS — Z20828 Contact with and (suspected) exposure to other viral communicable diseases: Secondary | ICD-10-CM | POA: Diagnosis not present

## 2021-06-07 ENCOUNTER — Ambulatory Visit (INDEPENDENT_AMBULATORY_CARE_PROVIDER_SITE_OTHER): Payer: Medicare Other | Admitting: Podiatry

## 2021-06-07 ENCOUNTER — Other Ambulatory Visit: Payer: Self-pay

## 2021-06-07 DIAGNOSIS — M21611 Bunion of right foot: Secondary | ICD-10-CM

## 2021-06-07 DIAGNOSIS — M2041 Other hammer toe(s) (acquired), right foot: Secondary | ICD-10-CM | POA: Diagnosis not present

## 2021-06-07 DIAGNOSIS — M21621 Bunionette of right foot: Secondary | ICD-10-CM

## 2021-06-07 DIAGNOSIS — M2012 Hallux valgus (acquired), left foot: Secondary | ICD-10-CM

## 2021-06-07 DIAGNOSIS — M21612 Bunion of left foot: Secondary | ICD-10-CM | POA: Diagnosis not present

## 2021-06-07 DIAGNOSIS — L84 Corns and callosities: Secondary | ICD-10-CM

## 2021-06-07 DIAGNOSIS — M2042 Other hammer toe(s) (acquired), left foot: Secondary | ICD-10-CM

## 2021-06-07 DIAGNOSIS — M2011 Hallux valgus (acquired), right foot: Secondary | ICD-10-CM | POA: Diagnosis not present

## 2021-06-07 DIAGNOSIS — M21622 Bunionette of left foot: Secondary | ICD-10-CM | POA: Diagnosis not present

## 2021-06-07 DIAGNOSIS — Z20828 Contact with and (suspected) exposure to other viral communicable diseases: Secondary | ICD-10-CM | POA: Diagnosis not present

## 2021-06-11 DIAGNOSIS — Z20828 Contact with and (suspected) exposure to other viral communicable diseases: Secondary | ICD-10-CM | POA: Diagnosis not present

## 2021-06-11 NOTE — Progress Notes (Signed)
  Subjective:  Patient ID: Linda [name], female    DOB: 1946/09/11,  MRN: [id number]  Chief Complaint  Patient presents with   Linda [name] Toe    3 month follow up right foot    DOS: 01/18/2021 Procedure: Fifth hammertoe correction and partial syndactylization with excision of corn right foot  75 y.o. female returns for post-op check.  She continues to improve.  Has occasional pain around the fifth toe but overall doing fairly well review of Systems: Negative except as noted in the HPI. Denies N/V/F/Ch.   Objective:  There were no vitals filed for this visit. There is no height or weight on file to calculate BMI. Constitutional Well developed. Well nourished.  Vascular Foot warm and well perfused. Capillary refill normal to all digits.   Neurologic Normal speech. Oriented to person, place, and time. Epicritic sensation to light touch grossly present bilaterally.  Dermatologic Skin is well-healed with nonhypertrophic scar.  No erythema or edema  Orthopedic:  Minimal tenderness to palpation noted about the surgical site.  She has hammertoe deformities with abduction at the DIPJ as well bilateral second as well as hallux valgus.  Tailor's bunion deformity bilaterally   Radiographs: Consistent with postoperative changes of arthroplasty fifth toe Assessment:   1. Heloma molle   2. Hammertoe of right foot   3. Hallux valgus with bunions, right   4. Tailor's bunion of both feet   5. Hallux valgus with bunions, left   6. Hammertoe of left foot     Plan:  Patient was evaluated and treated and all questions answered.  S/p foot surgery right -Overall doing very well.  She will continue to monitor the bunion and hammertoes on the other toes under like to avoid surgery at this point.  Fourth and fifth toe correction is maintained she does still have occasional tenderness but I think this will continue to improve up to a year after surgery.  Return as needed.  Return if symptoms worsen  or fail to improve.

## 2021-06-14 DIAGNOSIS — Z20828 Contact with and (suspected) exposure to other viral communicable diseases: Secondary | ICD-10-CM | POA: Diagnosis not present

## 2021-06-18 DIAGNOSIS — Z20828 Contact with and (suspected) exposure to other viral communicable diseases: Secondary | ICD-10-CM | POA: Diagnosis not present

## 2021-06-21 DIAGNOSIS — Z20828 Contact with and (suspected) exposure to other viral communicable diseases: Secondary | ICD-10-CM | POA: Diagnosis not present

## 2021-06-26 DIAGNOSIS — Z20828 Contact with and (suspected) exposure to other viral communicable diseases: Secondary | ICD-10-CM | POA: Diagnosis not present

## 2021-06-27 ENCOUNTER — Ambulatory Visit: Payer: Medicare Other

## 2021-06-28 ENCOUNTER — Telehealth: Payer: Self-pay | Admitting: *Deleted

## 2021-06-28 DIAGNOSIS — Z20828 Contact with and (suspected) exposure to other viral communicable diseases: Secondary | ICD-10-CM | POA: Diagnosis not present

## 2021-06-28 NOTE — Telephone Encounter (Signed)
Linda [name] called and said that her pain medication is  not helping.  She is wondering if [name] can give her something stronger. Please advise.

## 2021-06-28 NOTE — Telephone Encounter (Signed)
We could consider changing percocet to oxycodone 15mg  q6 prn.  Her next visit is on 7/18 with Zella Ball

## 2021-07-02 DIAGNOSIS — Z20828 Contact with and (suspected) exposure to other viral communicable diseases: Secondary | ICD-10-CM | POA: Diagnosis not present

## 2021-07-02 NOTE — Telephone Encounter (Signed)
Patient informed. 

## 2021-07-04 ENCOUNTER — Ambulatory Visit (INDEPENDENT_AMBULATORY_CARE_PROVIDER_SITE_OTHER): Payer: Medicare Other | Admitting: *Deleted

## 2021-07-04 DIAGNOSIS — Z0000 Encounter for general adult medical examination without abnormal findings: Secondary | ICD-10-CM | POA: Diagnosis not present

## 2021-07-04 NOTE — Progress Notes (Signed)
Subjective:   Linda [name] is a 75 y.o. female who presents for Medicare Annual (Subsequent) preventive examination.  I connected with  [name] on 07/04/21 by a telephone enabled telemedicine application and verified that I am speaking with the correct person using two identifiers.   I discussed the limitations of evaluation and management by telemedicine. The patient expressed understanding and agreed to proceed.   Review of Systems    NA Cardiac Risk Factors include: advanced age (>55men, >56 women);hypertension;dyslipidemia     Objective:    Today's Vitals   07/04/21 1331  PainSc: 3    There is no height or weight on file to calculate BMI.  Advanced Directives 07/04/2021 11/24/2020 06/15/2020 06/15/2020 06/14/2020 06/14/2020 01/20/2020  Does Patient Have a Medical Advance Directive? Yes No Yes Yes Yes No No  Type of Paramedic of [place];Living will - Out of facility DNR (pink MOST or yellow form);Living will Out of facility DNR (pink MOST or yellow form);Living will Out of facility DNR (pink MOST or yellow form);Living will - -  Does patient want to make changes to medical advance directive? - - No - Patient declined - No - Patient declined - -  Copy of [place] in Chart? No - copy requested - - - - - -  Would patient like information on creating a medical advance directive? - No - Patient declined - - - - -  Pre-existing out of facility DNR order (yellow form or pink MOST form) - - - - - - -    Current Medications (verified) Outpatient Encounter Medications as of 07/04/2021  Medication Sig   albuterol (VENTOLIN HFA) 108 (90 Base) MCG/ACT inhaler Inhale 1-2 puffs into the lungs every 6 (six) hours as needed for wheezing or shortness of breath.   alendronate (FOSAMAX) 70 MG tablet Take 1 tablet (70 mg total) by mouth every 7 (seven) days. Take with a full glass of water on an empty stomach, first thing in the morning and  remain up right for 30 minutes after taking.   ALPRAZolam (XANAX) 1 MG tablet TAKE 1 TABLET THREE TIMES DAILY AS NEEDED FOR ANXIETY.   aspirin 81 MG tablet Take 81 mg by mouth at bedtime.   budesonide-formoterol (SYMBICORT) 80-4.5 MCG/ACT inhaler 2 puffs   cyclobenzaprine (FLEXERIL) 5 MG tablet Take 1 tablet (5 mg total) by mouth 2 (two) times daily as needed for muscle spasms.   diclofenac Sodium (VOLTAREN) 1 % GEL Apply 2 g topically 4 (four) times daily. 4 times a day to both knees   docusate sodium (COLACE) 100 MG capsule Take 1 capsule (100 mg total) by mouth 2 (two) times daily.   donepezil (ARICEPT) 10 MG tablet Take 1 tablet (10 mg total) by mouth at bedtime. must have office visit   DULoxetine (CYMBALTA) 30 MG capsule TAKE 1 CAPSULE ONCE DAILY ALONG WITH 60MG  CAPSULE FOR TOTAL 90MG  DAILY. (Patient taking differently: Take 30 mg by mouth See admin instructions.)   DULoxetine (CYMBALTA) 60 MG capsule TAKE 1 CAPSULE A DAY TO BE COMBINED WITH 30MG  CAPSULE (Patient taking differently: Take 60 mg by mouth See admin instructions.)   EPINEPHrine 0.3 mg/0.3 mL IJ SOAJ injection Inject 0.3 mg into the muscle as needed for anaphylaxis.   fluconazole (DIFLUCAN) 100 MG tablet TAKE (1) TABLET DAILY AS NEEDED FOR THRUSH   fluticasone (FLONASE) 50 MCG/ACT nasal spray USE 2 SPRAYS IN EACH NOSTRIL ONCE DAILY   furosemide (LASIX) 40  MG tablet Take 1 tablet (40 mg total) by mouth daily as needed for fluid.   ipratropium (ATROVENT) 0.03 % nasal spray Place 2 sprays into both nostrils every 12 (twelve) hours.   isosorbide mononitrate (IMDUR) 30 MG 24 hr tablet TAKE 2 TABLETS IN THE AM AND 1 TABLET IN THE PM.   lamoTRIgine (LAMICTAL) 150 MG tablet TAKE 1 TABLET EACH DAY.   metoprolol succinate (TOPROL-XL) 100 MG 24 hr tablet 1 tab po qd, take additional 1/2 tab as needed for bp > 160/90   metroNIDAZOLE (METROGEL) 0.75 % gel Apply 1 application topically at bedtime.   Multiple Vitamin (MULTIVITAMIN WITH  MINERALS) TABS tablet Take 1 tablet by mouth daily.   mupirocin ointment (BACTROBAN) 2 % Apply 1 application topically 2 (two) times daily. Can be ointment or cream- whichever on formulary and cheapest for her   nitroGLYCERIN (NITROSTAT) 0.4 MG SL tablet Place 1 tablet (0.4 mg total) under the tongue every 5 (five) minutes as needed for chest pain (x 3 doses).   ondansetron (ZOFRAN) 8 MG tablet Take 1 tablet (8 mg total) by mouth every 8 (eight) hours as needed for nausea or vomiting.   oxyCODONE-acetaminophen (PERCOCET) 10-325 MG tablet Take 1 tablet by mouth every 6 (six) hours as needed for pain. Do Not Fill Before 06/14/21   pantoprazole (PROTONIX) 40 MG tablet TAKE 1 TABLET BY MOUTH TWICE DAILY.   rosuvastatin (CRESTOR) 20 MG tablet TAKE 1 TABLET ONCE DAILY.   solifenacin (VESICARE) 10 MG tablet Take 1 tablet (10 mg total) by mouth daily.   traZODone (DESYREL) 50 MG tablet Take by mouth. 2-4 tablets at bedtime   No facility-administered encounter medications on file as of 07/04/2021.    Allergies (verified) Abilify [aripiprazole] and Penicillins   History: Past Medical History:  Diagnosis Date   Acute upper GI bleed 06/2014   while pt taking coumadin, plavix, and meloxicam---despite being told not to take coumadin.   Anginal pain ([place])    Nonobstructive CAD 2014; however, her cardiologist put her on a statin for this and NOT for hyperlipidemia per pt report.  Atyp CP 08/2017 at card f/u, plan for myoc perf imaging.   Anxiety    panic attacks   Asthma    w/ asbestososis    BPPV (benign paroxysmal positional vertigo) 12/16/2012   Chronic diastolic CHF (congestive heart failure) (HCC)    dry wt as of 11/06/16 is 168 lbs.   Chronic lower back pain    COPD (chronic obstructive pulmonary disease) (HCC)    DDD (degenerative disc disease)    lumbar and cervical.    Diverticular disease    Fibromyalgia    Patient states dx was around her late 11s but she had sx's for years prior to this.    H/O hiatal hernia    History of pneumonia    hospitalized 12/2011, 02/2013, and 07/2013 [facility]) for this   HTN (hypertension)    Renal artery dopplers 04/2013 neg for stenosis.   Hypervitaminosis D 09/27/2019   over-supplemented.  Stopped vit D and plan recheck 2 mo.   Idiopathic angio-edema-urticaria 72014; 2021   Angioedema component was very minimal.  2021->Dr. Bobbitt (allergist) eval.   Insomnia    Iron deficiency anemia    Hematologist in [place], [place] did extensive w/u; no cause found; failed oral supplement;; gets fairly regular (q65m or so) IV iron infusions (Venofer -iron sucrose- 200mg  with procrit.  "for 14 yr I've been getting blood work q month & getting  infusions prn" (07/12/2013).  Dr. Marin Olp locally, iron infusions done, EPO deficiency dx'd   Migraine syndrome    "not as often anymore; used to be ~ q wk" (07/12/2013)   Mixed incontinence urge and stress    Nephrolithiasis    "passed all on my own or they are still in there" (07/12/2013)   Neuroleptic induced parkinsonism (HCC) 2018   Dr. Carles Collet, neuro, saw her 11/24/17 and recommended d/c of abilify as first step.  D/c'd abilify and pt got complete recovery.   Oropharyngeal dysphagia    swallowing study speech path 05/2020. Gastric bx's showed gastritis, h pylori NEG   OSA on CPAP    prior to move to [place]--had another sleep study 10/2015 w/pulm Dr. Camillo Flaming.   Osteoarthritis    "severe; progressing fast" (07/12/2013); multiple joints-not surgical candidate for TKR (03/2015).  Triamcinolon knee injections by Dr. Tessa Lerner 12/2017.   Pernicious anemia 08/24/2014   Pleural plaque with presence of asbestos 07/22/2013   Pulmonary embolism ([place]) 07/2013   Dx at [facility] with very small peripheral upper lobe pe 07/2013: pt took coumadin for about 8-9 mo   Pyelonephritis    "several times over the last 30 yr" (07/12/2013)   RBBB (right bundle branch block)    Recurrent major depression ([place])    Recurrent UTI    hx of hospitalization for pyelonephritis;  started abx prophylaxis 06/2015   Syncope    Hypotensive; ED visit--Dr. Terrence Dupont did Cath--nonobstructive CAD, EF 55-60%.  In retrospect, suspect pt rx med misuse/polypharmacy   Past Surgical History:  Procedure Laterality Date   APPENDECTOMY  1960   AXILLARY SURGERY Left 1978   Multiple "lump" in armpit per pt   BIOPSY  06/17/2020   Gastric bx->gastritis, h pylori neg.  Procedure: BIOPSY;  Surgeon: Carol Ada, MD;  Location: [facility];  Service: Endoscopy;;   CARDIAC CATHETERIZATION  01/2013   nonobstructive CAD, EF 55-60%   CARDIOVASCULAR STRESS TEST  02/22/15   Low risk myocard perf imaging; wall motion normal, normal EF   carotid duplex doppler  10/21/2017   R vertebral flow suggestive of possible distal obstruction.  Pt declined further w/u as of 10/29/17 but need to revisit this problem periodically.   COCCYX REMOVAL  1972   DEXA  06/05/2017   T-score -3.1   DILATION AND CURETTAGE OF UTERUS  ? 1970's   ESOPHAGOGASTRODUODENOSCOPY N/A 07/19/2014   Gastritis found + in the setting of supratherapeutic INR, +plavix, + meloxicam.   ESOPHAGOGASTRODUODENOSCOPY (EGD) WITH PROPOFOL N/A 06/17/2020   NO stricture or other prob to explain pt's dysphagia, dilation was done anyway.  Gastric bx's-->gastritis, h pylori neg. Procedure: ESOPHAGOGASTRODUODENOSCOPY (EGD) WITH PROPOFOL;  Surgeon: Carol Ada, MD;  Location: [facility];  Service: Endoscopy;  Laterality: N/A;   EYE SURGERY Left 2012-2013   "injections for ~ 1 yr; don't really know what for" (07/12/2013)   HEEL SPUR SURGERY Left 2008   KNEE SURGERY  2005   LEFT HEART CATHETERIZATION WITH CORONARY ANGIOGRAM N/A 01/30/2013   Procedure: LEFT HEART CATHETERIZATION WITH CORONARY ANGIOGRAM;  Surgeon: Clent Demark, MD;  Location: [facility];  Service: Cardiovascular;  Laterality: N/A;   MALONEY DILATION  06/17/2020   Procedure: Venia Minks DILATION;  Surgeon: Carol Ada, MD;  Location: [facility];  Service: Endoscopy;;   PLANTAR FASCIA  RELEASE Left 2008   SPIROMETRY  04/25/14   In hosp for acute asthma/COPD flare: mixed obstructive and restrictive lung disease. The FEV1 is severely reduced at 45% predicted.  FEV1  signif decreased compared to prior spirometry 07/23/13.   TENDON RELEASE  1996   Right forearm and hand   TOTAL ABDOMINAL HYSTERECTOMY  1974   TRANSTHORACIC ECHOCARDIOGRAM  01/2013; 04/2014;08/2015; 09/2017   2014--NORMAL.  2015--focal basal septal hypertrophy, EF 55-60%, grade I diast dysfxn, mild LAE.  08/2015 EF 55-60%, nl LV syst fxn, grade I DD, valves wnl. 10/21/17: EF 65-70%, grd I DD, o/w normal.   Family History  Problem Relation Age of Onset   Arthritis Mother    Kidney disease Mother    Heart disease Father    Stroke Father    Hypertension Father    Diabetes Father    Heart attack Father    Heart attack Paternal Grandmother    Diabetes Sister        one sister   Hypertension Sister    Asthma Sister    Hypertension Brother    Asthma Brother    Asthma Daughter    Multiple sclerosis Son    Social History   Socioeconomic History   Marital status: Widowed    Spouse name: Not on file   Number of children: 2   Years of education: Not on file   Highest education level: Not on file  Occupational History   Occupation: Retired    Comment: Pharmacist, 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  Relocated to [place] 09/2012 to be closer to her son who has MS.   Husband d 2015--mesothelioma.   Occupation: former Pharmacist, hospital.   Education: masters degree level.   No T/A/Ds.   Lives in [place], independent living.   Social Determinants of Health   Financial Resource Strain:  Low Risk    Difficulty of Paying Living Expenses: Not hard at all  Food Insecurity: No Food Insecurity   Worried About Charity fundraiser in the Last Year: Never true   [place] in the Last Year: Never true  Transportation Needs: No Transportation Needs   Lack of Transportation (Medical): No   Lack of Transportation (Non-Medical): No  Physical Activity: Insufficiently Active   Days of Exercise per Week: 2 days   Minutes of Exercise per Session: 20 min  Stress: No Stress Concern Present   Feeling of Stress : Not at all  Social Connections: Moderately Isolated   Frequency of Communication with Friends and Family: Three times a week   Frequency of Social Gatherings with Friends and Family: More than three times a week   Attends Religious Services: More than 4 times per year   Active Member of Clubs or Organizations: No   Attends Archivist Meetings: Never   Marital Status: Widowed    Tobacco Counseling Counseling given: Not Answered Tobacco comments: never used tobacco   Clinical Intake:  Pre-visit preparation completed: Yes  Pain : 0-10 Pain Score: 3  Pain Type: Chronic pain Pain Location: Back Pain Descriptors / Indicators: Aching Pain Onset: More than a month ago Pain Frequency: Several days a week Pain Relieving Factors: ocycodone  Pain Relieving Factors: ocycodone  Nutritional Risks: None  Diabetes: No  How often do you need to have someone help you when you read instructions, pamphlets, or other written materials from your doctor or pharmacy?: 1 - Never  Diabetic?  NO  Interpreter Needed?: No  Information entered by :: Leroy Kennedy LPN   Activities of Daily Living In your present state of health, do you have any difficulty performing the following activities: 07/04/2021 11/25/2020  Hearing? N N  Vision? N N  Difficulty concentrating or making decisions? N Y  Walking or climbing stairs? Y N  Dressing or bathing? N N  Doing errands,  shopping? Tempie Donning  Preparing Food and eating ? Y -  Using the Toilet? N -  In the past six months, have you accidently leaked urine? Y -  Do you have problems with loss of bowel control? N -  Managing your Medications? N -  Managing your Finances? N -  Housekeeping or managing your Housekeeping? Y -  Some recent data might be hidden    Patient Care Team: Tammi Sou, MD as PCP - General (Family Medicine) Marin Olp Rudell Cobb, MD as Consulting Physician (Oncology) Tanda Rockers, MD as Consulting Physician (Pulmonary Disease) Gavin Pound, MD as Consulting Physician (Rheumatology) Meredith Staggers, MD as Consulting Physician (Physical Medicine and Rehabilitation) Hennie Duos, MD as Consulting Physician (Rheumatology) Lelon Perla, MD as Consulting Physician (Cardiology) Gaynelle Arabian, MD as Consulting Physician (Orthopedic Surgery) Troy Sine, MD as Consulting Physician (Cardiology) Tat, Eustace Quail, DO as Consulting Physician (Neurology) Bobbitt, [place] Muta, MD (Inactive) as Consulting Physician (Allergy and Immunology)  Indicate any recent Medical Services you may have received from other than [facility] providers in the past year (date may be approximate).     Assessment:   This is a routine wellness examination for [place].  Hearing/Vision screen Hearing Screening - Comments:: No trouble hearing   Vision Screening - Comments:: 03-2021 last appointment [facility]  Dietary issues and exercise activities discussed: Current Exercise Habits: Home exercise routine, Type of exercise: walking, Time (Minutes): 20, Intensity: Mild   Goals Addressed             This Visit's Progress    Patient Stated       Would like to walk with less pain        Depression Screen PHQ 2/9 Scores 07/04/2021 07/04/2021 03/15/2021 12/13/2020 11/01/2020 09/29/2020 07/24/2020  PHQ - 2 Score 0 0 2 2 2  0 2  PHQ- 9 Score - - - - - 10 -    Fall Risk Fall Risk  07/04/2021 05/09/2021  03/15/2021 12/13/2020 11/01/2020  Falls in the past year? 1 0 0 0 1  Comment - - - - -  Number falls in past yr: 0 - - 0 0  Comment - - - - -  Injury with Fall? 0 - 0 0 0  Comment - - - - -  Risk Factor Category  - - - - -  Risk for fall due to : - - - - Impaired balance/gait  Risk for fall due to: Comment - - - - -  Follow up Falls evaluation completed;Falls prevention discussed - - - -  Comment - - - - -    FALL RISK PREVENTION PERTAINING TO THE HOME:  Any stairs in or around the home? No  If so, are there any without handrails? No  Home free of loose throw rugs in walkways, pet beds, electrical cords, etc? Yes  Adequate lighting  in your home to reduce risk of falls? Yes   ASSISTIVE DEVICES UTILIZED TO PREVENT FALLS:  Life alert? No  Use of a cane, walker or w/c? Yes  Grab bars in the bathroom? Yes  Shower chair or bench in shower? Yes  Elevated toilet seat or a handicapped toilet? Yes   TIMED UP AND GO:  Was the test performed? No .    Cognitive Function:  Normal cognitive status assessed by direct observation by this Nurse Health Advisor. No abnormalities found.          Immunizations Immunization History  Administered Date(s) Administered   Fluad Quad(high Dose 65+) 09/17/2019, 09/29/2020   Influenza Split 08/23/2012   Influenza, High Dose Seasonal PF 10/01/2016, 09/04/2018   Influenza,inj,Quad PF,6+ Mos 09/28/2013, 10/05/2014   Pneumococcal Conjugate-13 05/03/2015   Pneumococcal Polysaccharide-23 03/24/2011    TDAP status: Due, Education has been provided regarding the importance of this vaccine. Advised may receive this vaccine at local pharmacy or Health Dept. Aware to provide a copy of the vaccination record if obtained from local pharmacy or Health Dept. Verbalized acceptance and understanding.  Flu Vaccine status: Due, Education has been provided regarding the importance of this vaccine. Advised may receive this vaccine at local pharmacy or Health Dept.  Aware to provide a copy of the vaccination record if obtained from local pharmacy or Health Dept. Verbalized acceptance and understanding.  Pneumococcal vaccine status: Up to date  Covid-19 vaccine status: Declined, Education has been provided regarding the importance of this vaccine but patient still declined. Advised may receive this vaccine at local pharmacy or Health Dept.or vaccine clinic. Aware to provide a copy of the vaccination record if obtained from local pharmacy or Health Dept. Verbalized acceptance and understanding.  Qualifies for Shingles Vaccine? Yes   Zostavax completed No   Shingrix Completed?: No.    Education has been provided regarding the importance of this vaccine. Patient has been advised to call insurance company to determine out of pocket expense if they have not yet received this vaccine. Advised may also receive vaccine at local pharmacy or Health Dept. Verbalized acceptance and understanding.  Screening Tests Health Maintenance  Topic Date Due   COVID-19 Vaccine (1) Never done   Hepatitis C Screening  Never done   TETANUS/TDAP  Never done   Zoster Vaccines- Shingrix (1 of 2) Never done   COLONOSCOPY (Pts 45-40yrs Insurance coverage will need to be confirmed)  03/23/2016   INFLUENZA VACCINE  07/23/2021   DEXA SCAN  Completed   PNA vac Low Risk Adult  Completed   HPV VACCINES  Aged Out    Health Maintenance  Health Maintenance Due  Topic Date Due   COVID-19 Vaccine (1) Never done   Hepatitis C Screening  Never done   TETANUS/TDAP  Never done   Zoster Vaccines- Shingrix (1 of 2) Never done   COLONOSCOPY (Pts 45-47yrs Insurance coverage will need to be confirmed)  03/23/2016    Colorectal cancer screening: No longer required.   Mammogram status: No longer required due to p.  Bone Density, patient will wait to schedule  Lung Cancer Screening: (Low Dose CT Chest recommended if Age 16-80 years, 30 pack-year currently smoking OR have quit w/in 15years.)  does not qualify.   Lung Cancer Screening Referral: na  Additional Screening:  Hepatitis C Screening: does qualify;   Vision Screening: Recommended annual ophthalmology exams for early detection of glaucoma and other disorders of the eye. Is the patient up to date with  their annual eye exam?  Yes  Who is the provider or what is the name of the office in which the patient attends annual eye exams? [facility] If pt is not established with a provider, would they like to be referred to a provider to establish care?  established .   Dental Screening: Recommended annual dental exams for proper oral hygiene  Community Resource Referral / Chronic Care Management: CRR required this visit?  No   CCM required this visit?  No      Plan:     I have personally reviewed and noted the following in the patient's chart:   Medical and social history Use of alcohol, tobacco or illicit drugs  Current medications and supplements including opioid prescriptions.  Functional ability and status Nutritional status Physical activity Advanced directives List of other physicians Hospitalizations, surgeries, and ER visits in previous 12 months Vitals Screenings to include cognitive, depression, and falls Referrals and appointments  In addition, I have reviewed and discussed with patient certain preventive protocols, quality metrics, and best practice recommendations. A written personalized care plan for preventive services as well as general preventive health recommendations were provided to patient.     Leroy Kennedy, LPN   [id number]/81/[id number]   Nurse Notes: na

## 2021-07-04 NOTE — Patient Instructions (Signed)
Linda [name] , Thank you for taking time to come for your Medicare Wellness Visit. I appreciate your ongoing commitment to your health goals. Please review the following plan we discussed and let me know if I can assist you in the future.   Screening recommendations/referrals: Colonoscopy: no longer required Mammogram: education provided Bone Density: education provided Recommended yearly ophthalmology/optometry visit for glaucoma screening and checkup Recommended yearly dental visit for hygiene and checkup  Vaccinations: Influenza vaccine: education provided Pneumococcal vaccine: education provided Tdap vaccine: education provided Shingles vaccine: education provided    Advanced directives: copy requested  Conditions/risks identified: na  Next appointment: 07-16-2021 @ 11:00 [name]   Preventive Care 65 Years and Older, Female Preventive care refers to lifestyle choices and visits with your health care provider that can promote health and wellness. What does preventive care include? A yearly physical exam. This is also called an annual well check. Dental exams once or twice a year. Routine eye exams. Ask your health care provider how often you should have your eyes checked. Personal lifestyle choices, including: Daily care of your teeth and gums. Regular physical activity. Eating a healthy diet. Avoiding tobacco and drug use. Limiting alcohol use. Practicing safe sex. Taking low-dose aspirin every day. Taking vitamin and mineral supplements as recommended by your health care provider. What happens during an annual well check? The services and screenings done by your health care provider during your annual well check will depend on your age, overall health, lifestyle risk factors, and family history of disease. Counseling  Your health care provider may ask you questions about your: Alcohol use. Tobacco use. Drug use. Emotional well-being. Home and relationship  well-being. Sexual activity. Eating habits. History of falls. Memory and ability to understand (cognition). Work and work Statistician. Reproductive health. Screening  You may have the following tests or measurements: Height, weight, and BMI. Blood pressure. Lipid and cholesterol levels. These may be checked every 5 years, or more frequently if you are over 52 years old. Skin check. Lung cancer screening. You may have this screening every year starting at age 30 if you have a 30-pack-year history of smoking and currently smoke or have quit within the past 15 years. Fecal occult blood test (FOBT) of the stool. You may have this test every year starting at age 72. Flexible sigmoidoscopy or colonoscopy. You may have a sigmoidoscopy every 5 years or a colonoscopy every 10 years starting at age 50. Hepatitis C blood test. Hepatitis B blood test. Sexually transmitted disease (STD) testing. Diabetes screening. This is done by checking your blood sugar (glucose) after you have not eaten for a while (fasting). You may have this done every 1-3 years. Bone density scan. This is done to screen for osteoporosis. You may have this done starting at age 80. Mammogram. This may be done every 1-2 years. Talk to your health care provider about how often you should have regular mammograms. Talk with your health care provider about your test results, treatment options, and if necessary, the need for more tests. Vaccines  Your health care provider may recommend certain vaccines, such as: Influenza vaccine. This is recommended every year. Tetanus, diphtheria, and acellular pertussis (Tdap, Td) vaccine. You may need a Td booster every 10 years. Zoster vaccine. You may need this after age 34. Pneumococcal 13-valent conjugate (PCV13) vaccine. One dose is recommended after age 81. Pneumococcal polysaccharide (PPSV23) vaccine. One dose is recommended after age 61. Talk to your health care provider about which  screenings  and vaccines you need and how often you need them. This information is not intended to replace advice given to you by your health care provider. Make sure you discuss any questions you have with your health care provider. Document Released: 01/05/2016 Document Revised: 08/28/2016 Document Reviewed: 10/10/2015 Elsevier Interactive Patient Education  2017 [place] Prevention in the Home Falls can cause injuries. They can happen to people of all ages. There are many things you can do to make your home safe and to help prevent falls. What can I do on the outside of my home? Regularly fix the edges of walkways and driveways and fix any cracks. Remove anything that might make you trip as you walk through a door, such as a raised step or threshold. Trim any bushes or trees on the path to your home. Use bright outdoor lighting. Clear any walking paths of anything that might make someone trip, such as rocks or tools. Regularly check to see if handrails are loose or broken. Make sure that both sides of any steps have handrails. Any raised decks and porches should have guardrails on the edges. Have any leaves, snow, or ice cleared regularly. Use sand or salt on walking paths during winter. Clean up any spills in your garage right away. This includes oil or grease spills. What can I do in the bathroom? Use night lights. Install grab bars by the toilet and in the tub and shower. Do not use towel bars as grab bars. Use non-skid mats or decals in the tub or shower. If you need to sit down in the shower, use a plastic, non-slip stool. Keep the floor dry. Clean up any water that spills on the floor as soon as it happens. Remove soap buildup in the tub or shower regularly. Attach bath mats securely with double-sided non-slip rug tape. Do not have throw rugs and other things on the floor that can make you trip. What can I do in the bedroom? Use night lights. Make sure that you have a  light by your bed that is easy to reach. Do not use any sheets or blankets that are too big for your bed. They should not hang down onto the floor. Have a firm chair that has side arms. You can use this for support while you get dressed. Do not have throw rugs and other things on the floor that can make you trip. What can I do in the kitchen? Clean up any spills right away. Avoid walking on wet floors. Keep items that you use a lot in easy-to-reach places. If you need to reach something above you, use a strong step stool that has a grab bar. Keep electrical cords out of the way. Do not use floor polish or wax that makes floors slippery. If you must use wax, use non-skid floor wax. Do not have throw rugs and other things on the floor that can make you trip. What can I do with my stairs? Do not leave any items on the stairs. Make sure that there are handrails on both sides of the stairs and use them. Fix handrails that are broken or loose. Make sure that handrails are as long as the stairways. Check any carpeting to make sure that it is firmly attached to the stairs. Fix any carpet that is loose or worn. Avoid having throw rugs at the top or bottom of the stairs. If you do have throw rugs, attach them to the floor with carpet tape. Make  sure that you have a light switch at the top of the stairs and the bottom of the stairs. If you do not have them, ask someone to add them for you. What else can I do to help prevent falls? Wear shoes that: Do not have high heels. Have rubber bottoms. Are comfortable and fit you well. Are closed at the toe. Do not wear sandals. If you use a stepladder: Make sure that it is fully opened. Do not climb a closed stepladder. Make sure that both sides of the stepladder are locked into place. Ask someone to hold it for you, if possible. Clearly mark and make sure that you can see: Any grab bars or handrails. First and last steps. Where the edge of each step  is. Use tools that help you move around (mobility aids) if they are needed. These include: Canes. Walkers. Scooters. Crutches. Turn on the lights when you go into a dark area. Replace any light bulbs as soon as they burn out. Set up your furniture so you have a clear path. Avoid moving your furniture around. If any of your floors are uneven, fix them. If there are any pets around you, be aware of where they are. Review your medicines with your doctor. Some medicines can make you feel dizzy. This can increase your chance of falling. Ask your doctor what other things that you can do to help prevent falls. This information is not intended to replace advice given to you by your health care provider. Make sure you discuss any questions you have with your health care provider. Document Released: 10/05/2009 Document Revised: 05/16/2016 Document Reviewed: 01/13/2015 Elsevier Interactive Patient Education  2017 [employer].

## 2021-07-06 ENCOUNTER — Telehealth: Payer: Self-pay | Admitting: Physical Medicine & Rehabilitation

## 2021-07-06 DIAGNOSIS — M1712 Unilateral primary osteoarthritis, left knee: Secondary | ICD-10-CM

## 2021-07-06 DIAGNOSIS — M1711 Unilateral primary osteoarthritis, right knee: Secondary | ICD-10-CM

## 2021-07-06 MED ORDER — OXYCODONE-ACETAMINOPHEN 10-325 MG PO TABS: 1.0000 | ORAL_TABLET | Freq: Four times a day (QID) | ORAL | 0 refills | Status: DC | PRN

## 2021-07-06 NOTE — Telephone Encounter (Signed)
Patient had an appt with Zella Ball on Monday 7/18 and she is out of office sick.  Patient will need a refill on Oxycodone.  Please call patient if this can be refilled.  I have scheduled her an appt in August with Zella Ball.

## 2021-07-09 ENCOUNTER — Encounter: Payer: Medicare Other | Admitting: Registered Nurse

## 2021-07-14 ENCOUNTER — Encounter (HOSPITAL_BASED_OUTPATIENT_CLINIC_OR_DEPARTMENT_OTHER): Payer: Self-pay | Admitting: Emergency Medicine

## 2021-07-14 ENCOUNTER — Emergency Department (HOSPITAL_BASED_OUTPATIENT_CLINIC_OR_DEPARTMENT_OTHER): Payer: Medicare Other

## 2021-07-14 ENCOUNTER — Emergency Department (HOSPITAL_BASED_OUTPATIENT_CLINIC_OR_DEPARTMENT_OTHER)
Admission: EM | Admit: 2021-07-14 | Discharge: 2021-07-14 | Disposition: A | Discharge status: Home / Self Care | Payer: Medicare Other | Attending: Emergency Medicine | Admitting: Emergency Medicine

## 2021-07-14 ENCOUNTER — Other Ambulatory Visit: Payer: Self-pay

## 2021-07-14 DIAGNOSIS — M25551 Pain in right hip: Secondary | ICD-10-CM | POA: Insufficient documentation

## 2021-07-14 DIAGNOSIS — M25552 Pain in left hip: Secondary | ICD-10-CM | POA: Diagnosis not present

## 2021-07-14 DIAGNOSIS — M1711 Unilateral primary osteoarthritis, right knee: Secondary | ICD-10-CM | POA: Diagnosis not present

## 2021-07-14 DIAGNOSIS — M7918 Myalgia, other site: Secondary | ICD-10-CM

## 2021-07-14 DIAGNOSIS — I2510 Atherosclerotic heart disease of native coronary artery without angina pectoris: Secondary | ICD-10-CM | POA: Diagnosis not present

## 2021-07-14 DIAGNOSIS — M1712 Unilateral primary osteoarthritis, left knee: Secondary | ICD-10-CM

## 2021-07-14 DIAGNOSIS — J45909 Unspecified asthma, uncomplicated: Secondary | ICD-10-CM | POA: Diagnosis not present

## 2021-07-14 DIAGNOSIS — M25561 Pain in right knee: Secondary | ICD-10-CM | POA: Diagnosis not present

## 2021-07-14 DIAGNOSIS — Z043 Encounter for examination and observation following other accident: Secondary | ICD-10-CM | POA: Diagnosis not present

## 2021-07-14 DIAGNOSIS — Z79899 Other long term (current) drug therapy: Secondary | ICD-10-CM | POA: Insufficient documentation

## 2021-07-14 DIAGNOSIS — M25562 Pain in left knee: Secondary | ICD-10-CM | POA: Insufficient documentation

## 2021-07-14 DIAGNOSIS — S0003XA Contusion of scalp, initial encounter: Secondary | ICD-10-CM | POA: Insufficient documentation

## 2021-07-14 DIAGNOSIS — J439 Emphysema, unspecified: Secondary | ICD-10-CM | POA: Diagnosis not present

## 2021-07-14 DIAGNOSIS — J449 Chronic obstructive pulmonary disease, unspecified: Secondary | ICD-10-CM | POA: Insufficient documentation

## 2021-07-14 DIAGNOSIS — W01198A Fall on same level from slipping, tripping and stumbling with subsequent striking against other object, initial encounter: Secondary | ICD-10-CM | POA: Diagnosis not present

## 2021-07-14 DIAGNOSIS — I110 Hypertensive heart disease with heart failure: Secondary | ICD-10-CM | POA: Insufficient documentation

## 2021-07-14 DIAGNOSIS — M542 Cervicalgia: Secondary | ICD-10-CM | POA: Diagnosis not present

## 2021-07-14 DIAGNOSIS — M25512 Pain in left shoulder: Secondary | ICD-10-CM | POA: Diagnosis not present

## 2021-07-14 DIAGNOSIS — M25511 Pain in right shoulder: Secondary | ICD-10-CM | POA: Diagnosis not present

## 2021-07-14 DIAGNOSIS — Z7952 Long term (current) use of systemic steroids: Secondary | ICD-10-CM | POA: Insufficient documentation

## 2021-07-14 DIAGNOSIS — I5032 Chronic diastolic (congestive) heart failure: Secondary | ICD-10-CM | POA: Diagnosis not present

## 2021-07-14 DIAGNOSIS — Z7982 Long term (current) use of aspirin: Secondary | ICD-10-CM | POA: Diagnosis not present

## 2021-07-14 DIAGNOSIS — S0990XA Unspecified injury of head, initial encounter: Secondary | ICD-10-CM | POA: Diagnosis present

## 2021-07-14 DIAGNOSIS — W19XXXA Unspecified fall, initial encounter: Secondary | ICD-10-CM

## 2021-07-14 MED ORDER — LIDOCAINE 5 % EX PTCH: 1.0000 | MEDICATED_PATCH | CUTANEOUS | 0 refills | Status: DC

## 2021-07-14 MED ORDER — OXYCODONE HCL 5 MG PO TABS
15.0000 mg | ORAL_TABLET | Freq: Once | ORAL | Status: AC
  Administered 2021-07-14: 15 mg via ORAL
  Filled 2021-07-14: qty 3

## 2021-07-14 MED ORDER — IBUPROFEN 600 MG PO TABS: 600.0000 mg | ORAL_TABLET | Freq: Four times a day (QID) | ORAL | 0 refills | Status: DC | PRN

## 2021-07-14 NOTE — ED Notes (Signed)
Pt to CT

## 2021-07-14 NOTE — ED Triage Notes (Addendum)
Reports her knees gave out on her causing her to fall.  C/o pain in knees and hips.  Also c/o bil shoulder pain.  Also reports she busted the back of her head as well.  Some dried blood noted.

## 2021-07-14 NOTE — ED Provider Notes (Signed)
[facility] Provider Note   CSN: [id number] Arrival date & time: 07/14/21  1929     History Chief Complaint  Patient presents with   Linda [name] is a 75 y.o. female.  Patient to ED after fall earlier today. She has a history of severe arthritis with chronic joint pain and was walking without her walker her knees buckled causing her to fall backward to the pavement. She hit her head but there was no LOC and no subsequent nausea or vomiting. She reports pain in her shoulders, hips and knees bilaterally, "worse than usual". No chest or abdominal pain.  The history is provided by the patient and a friend. No language interpreter was used.  Fall Pertinent negatives include no chest pain, no abdominal pain, no headaches and no shortness of breath.      Past Medical History:  Diagnosis Date   Acute upper GI bleed 06/2014   while pt taking coumadin, plavix, and meloxicam---despite being told not to take coumadin.   Anginal pain ([place])    Nonobstructive CAD 2014; however, her cardiologist put her on a statin for this and NOT for hyperlipidemia per pt report.  Atyp CP 08/2017 at card f/u, plan for myoc perf imaging.   Anxiety    panic attacks   Asthma    w/ asbestososis    BPPV (benign paroxysmal positional vertigo) 12/16/2012   Chronic diastolic CHF (congestive heart failure) (HCC)    dry wt as of 11/06/16 is 168 lbs.   Chronic lower back pain    COPD (chronic obstructive pulmonary disease) (HCC)    DDD (degenerative disc disease)    lumbar and cervical.    Diverticular disease    Fibromyalgia    Patient states dx was around her late 21s but she had sx's for years prior to this.   H/O hiatal hernia    History of pneumonia    hospitalized 12/2011, 02/2013, and 07/2013 [facility]) for this   HTN (hypertension)    Renal artery dopplers 04/2013 neg for stenosis.   Hypervitaminosis D 09/27/2019   over-supplemented.  Stopped vit D and plan recheck  2 mo.   Idiopathic angio-edema-urticaria 72014; 2021   Angioedema component was very minimal.  2021->Dr. Bobbitt (allergist) eval.   Insomnia    Iron deficiency anemia    Hematologist in [place], [place] did extensive w/u; no cause found; failed oral supplement;; gets fairly regular (q53mor so) IV iron infusions (Venofer -iron sucrose- '200mg'$  with procrit.  "for 14 yr I've been getting blood work q month & getting infusions prn" (07/12/2013).  Dr. EMarin Olplocally, iron infusions done, EPO deficiency dx'd   Migraine syndrome    "not as often anymore; used to be ~ q wk" (07/12/2013)   Mixed incontinence urge and stress    Nephrolithiasis    "passed all on my own or they are still in there" (07/12/2013)   Neuroleptic induced parkinsonism (HCC) 2018   Dr. TCarles Collet neuro, saw her 11/24/17 and recommended d/c of abilify as first step.  D/c'd abilify and pt got complete recovery.   Oropharyngeal dysphagia    swallowing study speech path 05/2020. Gastric bx's showed gastritis, h pylori NEG   OSA on CPAP    prior to move to [place]--had another sleep study 10/2015 w/pulm Dr. ECamillo Flaming   Osteoarthritis    "severe; progressing fast" (07/12/2013); multiple joints-not surgical candidate for TKR (03/2015).  Triamcinolon knee injections by Dr. STessa Lerner1/2019.  Pernicious anemia 08/24/2014   Pleural plaque with presence of asbestos 07/22/2013   Pulmonary embolism ([place]) 07/2013   Dx at [facility] with very small peripheral upper lobe pe 07/2013: pt took coumadin for about 8-9 mo   Pyelonephritis    "several times over the last 30 yr" (07/12/2013)   RBBB (right bundle branch block)    Recurrent major depression ([place])    Recurrent UTI    hx of hospitalization for pyelonephritis; started abx prophylaxis 06/2015   Syncope    Hypotensive; ED visit--Dr. Terrence Dupont did Cath--nonobstructive CAD, EF 55-60%.  In retrospect, suspect pt rx med misuse/polypharmacy    Patient Active Problem List   Diagnosis Date Noted   Acute encephalopathy  11/24/2020   Dysphagia 06/15/2020   Allergic reaction 02/01/2020   Idiopathic angioedema 01/20/2020   Dysarthria 01/20/2020   Hypotension 10/04/2019   COPD (chronic obstructive pulmonary disease) ([place]) 01/21/2018   Pulmonary asbestosis ([place]) 01/21/2018   Hypomagnesemia 01/02/2018   Failure to thrive in adult 01/01/2018   Altered mental status 10/21/2017   Lumbar radiculitis 02/10/2017   Low serum erythropoietin level 10/17/2016   RBBB 09/23/2016   Primary osteoarthritis of left knee 06/19/2016   Debilitated patient 06/06/2016   CHF (congestive heart failure) ([place]) 05/30/2016   Chronic respiratory failure with hypoxia ([place]) 02/07/2016   Restrictive lung disease 02/07/2016   Rotator cuff syndrome of right shoulder 01/10/2016   Encephalopathy, metabolic 99991111   Fall at home 01/01/2016   Rhabdomyolysis 99991111   Diastolic heart failure ([place]) 10/16/2015   CAD-minor 2014 08/16/2015   Chest pain with moderate risk for cardiac etiology 08/16/2015   Narrowing of intervertebral disc space 07/17/2015   Bilateral lower leg pain 01/24/2015   Damage to right ulnar nerve 01/16/2015   Chronic pain syndrome 11/21/2014   Anxiety and depression 11/21/2014   Hypokalemia 11/21/2014   Hyperlipidemia 11/21/2014   Chronic pain disorder 11/21/2014   Primary osteoarthritis of right knee 10/18/2014   Pernicious anemia 08/24/2014   Generalized anxiety disorder--with occasional panic attacks.  08/05/2014   Recurrent major depression-severe ([place]) 08/05/2014   Multifactorial gait disorder 07/26/2014   Epistaxis 07/18/2014   Acute GI bleeding 07/17/2014   Anemia associated with acute blood loss 07/17/2014   Syncope 07/17/2014   History of pulmonary embolism 07/17/2014   GI bleed 07/17/2014   Arthritis 05/11/2014   DDD (degenerative disc disease) 05/11/2014   Fibrositis 05/11/2014   Amianthosis ([place]) 05/11/2014   [name] ([place]) 05/11/2014   Grief 04/28/2014   OSA (obstructive sleep apnea)  04/24/2014   Chronic diastolic CHF, NYHA class 1 04/24/2014   Atelectasis 08/06/2013   Acute pulmonary embolism ([place]) 08/04/2013   Pleural plaque with presence of asbestos 07/22/2013   Polypharmacy 04/26/2013   Fibromyalgia syndrome 03/01/2013   Insomnia 11/12/2012   HTN (hypertension), benign 10/25/2012    Past Surgical History:  Procedure Laterality Date   APPENDECTOMY  1960   AXILLARY SURGERY Left 1978   Multiple "lump" in armpit per pt   BIOPSY  06/17/2020   Gastric bx->gastritis, h pylori neg.  Procedure: BIOPSY;  Surgeon: Carol Ada, MD;  Location: [facility];  Service: Endoscopy;;   CARDIAC CATHETERIZATION  01/2013   nonobstructive CAD, EF 55-60%   CARDIOVASCULAR STRESS TEST  02/22/15   Low risk myocard perf imaging; wall motion normal, normal EF   carotid duplex doppler  10/21/2017   R vertebral flow suggestive of possible distal obstruction.  Pt declined further w/u as of 10/29/17 but need to revisit this  problem periodically.   COCCYX REMOVAL  1972   DEXA  06/05/2017   T-score -3.1   DILATION AND CURETTAGE OF UTERUS  ? 1970's   ESOPHAGOGASTRODUODENOSCOPY N/A 07/19/2014   Gastritis found + in the setting of supratherapeutic INR, +plavix, + meloxicam.   ESOPHAGOGASTRODUODENOSCOPY (EGD) WITH PROPOFOL N/A 06/17/2020   NO stricture or other prob to explain pt's dysphagia, dilation was done anyway.  Gastric bx's-->gastritis, h pylori neg. Procedure: ESOPHAGOGASTRODUODENOSCOPY (EGD) WITH PROPOFOL;  Surgeon: Carol Ada, MD;  Location: [facility];  Service: Endoscopy;  Laterality: N/A;   EYE SURGERY Left 2012-2013   "injections for ~ 1 yr; don't really know what for" (07/12/2013)   HEEL SPUR SURGERY Left 2008   KNEE SURGERY  2005   LEFT HEART CATHETERIZATION WITH CORONARY ANGIOGRAM N/A 01/30/2013   Procedure: LEFT HEART CATHETERIZATION WITH CORONARY ANGIOGRAM;  Surgeon: Clent Demark, MD;  Location: [facility];  Service: Cardiovascular;  Laterality: N/A;   MALONEY DILATION   06/17/2020   Procedure: Venia Minks DILATION;  Surgeon: Carol Ada, MD;  Location: [facility];  Service: Endoscopy;;   PLANTAR FASCIA RELEASE Left 2008   SPIROMETRY  04/25/14   In hosp for acute asthma/COPD flare: mixed obstructive and restrictive lung disease. The FEV1 is severely reduced at 45% predicted.  FEV1 signif decreased compared to prior spirometry 07/23/13.   TENDON RELEASE  1996   Right forearm and hand   TOTAL ABDOMINAL HYSTERECTOMY  1974   TRANSTHORACIC ECHOCARDIOGRAM  01/2013; 04/2014;08/2015; 09/2017   2014--NORMAL.  2015--focal basal septal hypertrophy, EF 55-60%, grade I diast dysfxn, mild LAE.  08/2015 EF 55-60%, nl LV syst fxn, grade I DD, valves wnl. 10/21/17: EF 65-70%, grd I DD, o/w normal.     OB History   No obstetric history on file.     Family History  Problem Relation Age of Onset   Arthritis Mother    Kidney disease Mother    Heart disease Father    Stroke Father    Hypertension Father    Diabetes Father    Heart attack Father    Heart attack Paternal 82    Diabetes Sister        one sister   Hypertension Sister    Asthma Sister    Hypertension Brother    Asthma Brother    Asthma Daughter    Multiple sclerosis Son     Social History   Tobacco Use   Smoking status: Never   Smokeless tobacco: Never   Tobacco comments:    never used tobacco  Vaping Use   Vaping Use: Never used  Substance Use Topics   Alcohol use: No    Alcohol/week: 0.0 standard drinks   Drug use: No    Home Medications Prior to Admission medications   Medication Sig Start Date End Date Taking? Authorizing Provider  albuterol (VENTOLIN HFA) 108 (90 Base) MCG/ACT inhaler Inhale 1-2 puffs into the lungs every 6 (six) hours as needed for wheezing or shortness of breath. 12/28/20   McGowen, Adrian Blackwater, MD  alendronate (FOSAMAX) 70 MG tablet Take 1 tablet (70 mg total) by mouth every 7 (seven) days. Take with a full glass of water on an empty stomach, first thing in the  morning and remain up right for 30 minutes after taking. 07/26/19   McGowen, Adrian Blackwater, MD  ALPRAZolam Duanne Moron) 1 MG tablet TAKE 1 TABLET THREE TIMES DAILY AS NEEDED FOR ANXIETY. 05/16/21   McGowen, Adrian Blackwater, MD  aspirin 81 MG tablet  Take 81 mg by mouth at bedtime.    [provider]  budesonide-formoterol (SYMBICORT) 80-4.5 MCG/ACT inhaler 2 puffs    [provider]  cyclobenzaprine (FLEXERIL) 5 MG tablet Take 1 tablet (5 mg total) by mouth 2 (two) times daily as needed for muscle spasms. 05/11/21   Bayard Hugger, NP  diclofenac Sodium (VOLTAREN) 1 % GEL Apply 2 g topically 4 (four) times daily. 4 times a day to both knees 05/11/21   Danella Sensing L, NP  docusate sodium (COLACE) 100 MG capsule Take 1 capsule (100 mg total) by mouth 2 (two) times daily. 06/20/20   Alma Friendly, MD  donepezil (ARICEPT) 10 MG tablet Take 1 tablet (10 mg total) by mouth at bedtime. must have office visit 05/16/21   Tammi Sou, MD  DULoxetine (CYMBALTA) 30 MG capsule TAKE 1 CAPSULE ONCE DAILY ALONG WITH '60MG'$  CAPSULE FOR TOTAL '90MG'$  DAILY. Patient taking differently: Take 30 mg by mouth See admin instructions. 07/17/20   McGowen, Adrian Blackwater, MD  DULoxetine (CYMBALTA) 60 MG capsule TAKE 1 CAPSULE A DAY TO BE COMBINED WITH '30MG'$  CAPSULE Patient taking differently: Take 60 mg by mouth See admin instructions. 07/17/20   McGowen, Adrian Blackwater, MD  EPINEPHrine 0.3 mg/0.3 mL IJ SOAJ injection Inject 0.3 mg into the muscle as needed for anaphylaxis. 12/28/20   McGowen, Adrian Blackwater, MD  fluconazole (DIFLUCAN) 100 MG tablet TAKE (1) TABLET DAILY AS NEEDED FOR THRUSH 02/08/21   McGowen, Adrian Blackwater, MD  fluticasone (FLONASE) 50 MCG/ACT nasal spray USE 2 SPRAYS IN EACH NOSTRIL ONCE DAILY 02/02/21   McGowen, Adrian Blackwater, MD  furosemide (LASIX) 40 MG tablet Take 1 tablet (40 mg total) by mouth daily as needed for fluid. 10/07/19   Cherylann Ratel A, DO  ipratropium (ATROVENT) 0.03 % nasal spray Place 2 sprays into both nostrils every  12 (twelve) hours. 09/27/19   McGowen, Adrian Blackwater, MD  isosorbide mononitrate (IMDUR) 30 MG 24 hr tablet TAKE 2 TABLETS IN THE AM AND 1 TABLET IN THE PM. 04/12/21   Lelon Perla, MD  lamoTRIgine (LAMICTAL) 150 MG tablet TAKE 1 TABLET EACH DAY. 11/27/20   McGowen, Adrian Blackwater, MD  metoprolol succinate (TOPROL-XL) 100 MG 24 hr tablet 1 tab po qd, take additional 1/2 tab as needed for bp > 160/90 04/13/21   McGowen, Adrian Blackwater, MD  metroNIDAZOLE (METROGEL) 0.75 % gel Apply 1 application topically at bedtime. 11/06/20   [provider]  Multiple Vitamin (MULTIVITAMIN WITH MINERALS) TABS tablet Take 1 tablet by mouth daily.    [provider]  mupirocin ointment (BACTROBAN) 2 % Apply 1 application topically 2 (two) times daily. Can be ointment or cream- whichever on formulary and cheapest for her 10/20/20   Raoul Pitch, Renee A, DO  nitroGLYCERIN (NITROSTAT) 0.4 MG SL tablet Place 1 tablet (0.4 mg total) under the tongue every 5 (five) minutes as needed for chest pain (x 3 doses). 09/23/19   Lelon Perla, MD  ondansetron (ZOFRAN) 8 MG tablet Take 1 tablet (8 mg total) by mouth every 8 (eight) hours as needed for nausea or vomiting. 09/29/20   McGowen, Adrian Blackwater, MD  oxyCODONE-acetaminophen (PERCOCET) 10-325 MG tablet Take 1 tablet by mouth every 6 (six) hours as needed for pain. Do Not Fill Before 06/14/21 07/06/21   Meredith Staggers, MD  pantoprazole (PROTONIX) 40 MG tablet TAKE 1 TABLET BY MOUTH TWICE DAILY. 05/03/21   McGowen, Adrian Blackwater, MD  rosuvastatin (CRESTOR) 20 MG tablet  TAKE 1 TABLET ONCE DAILY. 02/08/21   Lelon Perla, MD  solifenacin (VESICARE) 10 MG tablet Take 1 tablet (10 mg total) by mouth daily. 04/18/21   McGowen, Adrian Blackwater, MD  traZODone (DESYREL) 50 MG tablet Take by mouth. 2-4 tablets at bedtime 12/20/20   [provider]    Allergies    Abilify [aripiprazole] and Penicillins  Review of Systems   Review of Systems  Constitutional:  Negative for chills and fever.   HENT: Negative.    Respiratory: Negative.  Negative for shortness of breath.   Cardiovascular: Negative.  Negative for chest pain.  Gastrointestinal: Negative.  Negative for abdominal pain.  Musculoskeletal:        See HPI.  Skin:  Positive for wound.       Posterior scalp  Neurological: Negative.  Negative for syncope and headaches.  Psychiatric/Behavioral:  Negative for confusion.    Physical Exam Updated Vital Signs BP 135/69 (BP Location: Right Arm)   Pulse 69   Temp 97.6 F (36.4 C) (Oral)   Resp 18   Ht '5\' 4"'$  (1.626 m)   Wt 65.3 kg   SpO2 92%   BMI 24.72 kg/m   Physical Exam Vitals and nursing note reviewed.  Constitutional:      General: She is not in acute distress.    Appearance: She is well-developed.  HENT:     Head: Normocephalic.     Comments: Abrasion to occipital scalp without significant hematoma.  Cardiovascular:     Rate and Rhythm: Normal rate and regular rhythm.     Heart sounds: No murmur heard. Pulmonary:     Effort: Pulmonary effort is normal.     Breath sounds: Normal breath sounds. No wheezing, rhonchi or rales.  Abdominal:     General: Bowel sounds are normal.     Palpations: Abdomen is soft.     Tenderness: There is no abdominal tenderness. There is no guarding or rebound.  Musculoskeletal:        General: Normal range of motion.     Cervical back: Normal range of motion and neck supple.     Comments: Joints generally tender. No deformities. No swelling. There is midline cervical tenderness without step off.   Skin:    General: Skin is warm and dry.  Neurological:     General: No focal deficit present.     Mental Status: She is alert and oriented to person, place, and time.     Sensory: No sensory deficit.    ED Results / Procedures / Treatments   Labs (all labs ordered are listed, but only abnormal results are displayed) Labs Reviewed - No data to display  EKG None  Radiology No results found.  Procedures Procedures    Medications Ordered in ED Medications - No data to display  ED Course  I have reviewed the triage vital signs and the nursing notes.  Pertinent labs & imaging results that were available during my care of the patient were reviewed by me and considered in my medical decision making (see chart for details).    MDM Rules/Calculators/A&P                           Patient to ED after fall as detailed in the HPI.   No LOC. She is well appearing, frail, in NAD. She is on chronic pain management under Management [facility] and takes 10 mg oxycodone 3-4 times daily for arthritis.  Imaging is negative for acute fracture, spine or intracranial injury. Discussed pain management at home. Recommended adding ibuprofen and Lidocaine patch.   She is felt appropriate for discharge home. She is a resident in independent living, with friend at bedside who can assist her care at home.   Final Clinical Impression(s) / ED Diagnoses Final diagnoses:  None   Fall Scalp contusion Musculoskeletal pain  Rx / DC Orders ED Discharge Orders     None        Dennie Bible 07/14/21 2219    Isla Pence, MD 07/14/21 2253

## 2021-07-14 NOTE — Discharge Instructions (Addendum)
Continue your pain medication as prescribed. Add ibuprofen 600 mg every 6 hours and a Lidoderm patch as directed.   Follow up with your doctor next week for recheck and to assist with pain management as needed.

## 2021-07-16 ENCOUNTER — Encounter: Payer: Self-pay | Admitting: Family Medicine

## 2021-07-16 ENCOUNTER — Ambulatory Visit (INDEPENDENT_AMBULATORY_CARE_PROVIDER_SITE_OTHER): Payer: Medicare Other | Admitting: Family Medicine

## 2021-07-16 ENCOUNTER — Other Ambulatory Visit: Payer: Self-pay

## 2021-07-16 VITALS — BP 118/69 | HR 70 | Temp 97.8°F | Resp 16 | Ht 64.0 in | Wt 142.6 lb

## 2021-07-16 DIAGNOSIS — R5381 Other malaise: Secondary | ICD-10-CM

## 2021-07-16 DIAGNOSIS — R296 Repeated falls: Secondary | ICD-10-CM

## 2021-07-16 DIAGNOSIS — N3941 Urge incontinence: Secondary | ICD-10-CM | POA: Diagnosis not present

## 2021-07-16 DIAGNOSIS — I10 Essential (primary) hypertension: Secondary | ICD-10-CM | POA: Diagnosis not present

## 2021-07-16 DIAGNOSIS — N3091 Cystitis, unspecified with hematuria: Secondary | ICD-10-CM | POA: Diagnosis not present

## 2021-07-16 DIAGNOSIS — F3342 Major depressive disorder, recurrent, in full remission: Secondary | ICD-10-CM

## 2021-07-16 DIAGNOSIS — R2681 Unsteadiness on feet: Secondary | ICD-10-CM | POA: Diagnosis not present

## 2021-07-16 DIAGNOSIS — F411 Generalized anxiety disorder: Secondary | ICD-10-CM

## 2021-07-16 DIAGNOSIS — R300 Dysuria: Secondary | ICD-10-CM | POA: Diagnosis not present

## 2021-07-16 DIAGNOSIS — I2510 Atherosclerotic heart disease of native coronary artery without angina pectoris: Secondary | ICD-10-CM

## 2021-07-16 DIAGNOSIS — E7800 Pure hypercholesterolemia, unspecified: Secondary | ICD-10-CM | POA: Diagnosis not present

## 2021-07-16 LAB — COMPREHENSIVE METABOLIC PANEL
ALT: 17 U/L (ref 0–35)
AST: 21 U/L (ref 0–37)
Albumin: 4.1 g/dL (ref 3.5–5.2)
Alkaline Phosphatase: 65 U/L (ref 39–117)
BUN: 18 mg/dL (ref 6–23)
CO2: 28 mEq/L (ref 19–32)
Calcium: 8.9 mg/dL (ref 8.4–10.5)
Chloride: 104 mEq/L (ref 96–112)
Creatinine, Ser: 0.81 mg/dL (ref 0.40–1.20)
GFR: 71.05 mL/min (ref >60.00)
Glucose, Bld: 84 mg/dL (ref 70–99)
Potassium: 4 mEq/L (ref 3.5–5.1)
Sodium: 142 mEq/L (ref 135–145)
Total Bilirubin: 0.4 mg/dL (ref 0.2–1.2)
Total Protein: 6.4 g/dL (ref 6.0–8.3)

## 2021-07-16 LAB — POCT URINALYSIS DIPSTICK
Bilirubin, UA: NEGATIVE
Blood, UA: POSITIVE
Glucose, UA: NEGATIVE
Ketones, UA: NEGATIVE
Nitrite, UA: NEGATIVE
Protein, UA: POSITIVE — AB
Spec Grav, UA: 1.025 (ref 1.010–1.025)
Urobilinogen, UA: 0.2 E.U./dL
pH, UA: 6 (ref 5.0–8.0)

## 2021-07-16 MED ORDER — SULFAMETHOXAZOLE-TRIMETHOPRIM 800-160 MG PO TABS: 1.0000 | ORAL_TABLET | Freq: Two times a day (BID) | ORAL | 0 refills | Status: DC

## 2021-07-16 NOTE — Progress Notes (Signed)
OFFICE VISIT  07/16/2021  CC:  Chief Complaint  Patient presents with   Follow-up    RCI; had recent ED visit 7/23 due to fall on Saturday afternoon. She lost her balance, normally gets injections every 39mo   HPI:    Patient is a 75y.o. Caucasian female who presents for f/u HTN, urge incont, recurrent MDD/GAD. A/P as of last visit: "1) HTN: stable.  Cont imdur '60mg'$  qAM and '30mg'$  qpm + toprol xl '100mg'$  qd.  Can take extra 1/2 of toprol prn bp >160/100. bmet today (hx hypokalemia).   2) Urge incontinence: not satisfied with response to myrbetriq + too costly. She wants to switch back to vesicare '10mg'$  qd, which she unfortunately also only had mild/mod success with and it doesn't last that long for her.   3) Recurrent MDD, hx of treatment resistant: in remission. Cont lamictal '150mg'$  qd and cymbalta 90 mg qd.   4) GAD: stable on cymbalta and scheduled tid xanax '1mg'$ . CSC UTD.  No new rx for alprazolam was needed today.   4) Widespread pain syndrome: multifactorial-->component of fibromyalgia + extensive [name]. I told her that I think the risks outweigh the benefits for her regarding any trial of med such as gabapentin or lyrica (medication interaction potential, hx of oversedation/polypharmacy in the past).  Unfortunately I have nothing new to offer her today regarding her pain.   5) Preventative health care: Due for screening mammogram (last was 2018) and f/u DEXA (last was 2018) but she wants to defer these for now.   6) Chronic hypoxic resp failure: copd +pulm fibrosis/asbestosis. Stable, cont symbicort, oxygen prn daytime + nocturnal, albut prn.   7) HLD, nonosbt CAD: LDL goal 70.  Tolerating rosuva '20mg'$  qd. LDL was 74 three months ago. Plan repeat 3-6 mo."  INTERIM HX: Sustained a fall and went to ED 07/14/21. [place] when knees buckled using walker and hit back of head. Imaging was negative for acute fracture, spine, or intracranial injury.  No new meds rx'd. Says  knees have been buckling lots of times lately, takes a long time to turn around when using walker. Still with R shoulder pain.  Has chronic baseline urge incontinence, but last 2 wks has had worse urinary urgency, frequency, burning with urination, says urine smells foul.  BP: pt not checking bp at home.    PMP AWARE reviewed today: most recent rx for alprazolam  was filled 06/21/21, # [id number] rx by me. No red flags.  ROS as above, plus--> no fevers, no CP, no change in basline SOB/DOE, no wheezing, no cough, no dizziness, no HAs, no rashes, no melena/hematochezia.  No polyuria or polydipsia.    No focal weakness or paresthesias.  Chronic mild bilat hand tremor when arms held outstretched.  No acute vision or hearing abnormalities.  NNo recent changes in lower legs. No n/v/d or abd pain.  No palpitations.     Past Medical History:  Diagnosis Date   Acute upper GI bleed 06/2014   while pt taking coumadin, plavix, and meloxicam---despite being told not to take coumadin.   [place]inal [place]    Nonobstructive CAD 2014; however, her cardiologist put her on a statin for this and NOT for hyperlipidemia per pt report.  Atyp CP 08/2017 at card f/u, plan for myoc perf imaging.   Anxiety    panic attacks   Asthma    w/ asbestososis    BPPV (benign paroxysmal positional vertigo) 12/16/2012   Chronic diastolic CHF (  congestive heart failure) (HCC)    dry wt as of 11/06/16 is 168 lbs.   Chronic lower back pain    COPD (chronic obstructive pulmonary disease) (HCC)    DDD (degenerative disc disease)    lumbar and cervical.    Diverticular disease    Fibromyalgia    Patient states dx was around her late 73s but she had sx's for years prior to this.   H/O hiatal hernia    History of pneumonia    hospitalized 12/2011, 02/2013, and 07/2013 [facility]) for this   HTN (hypertension)    Renal artery dopplers 04/2013 neg for stenosis.   Hypervitaminosis D 09/27/2019   over-supplemented.  Stopped vit D and plan  recheck 2 mo.   Idiopathic angio-edema-urticaria 72014; 2021   Angioedema component was very minimal.  2021->Dr. Bobbitt (allergist) eval.   Insomnia    Iron deficiency anemia    [place]; no cause found; failed oral supplement;; gets fairly regular (q81mor so) IV iron infusions (Venofer -iron sucrose- '200mg'$  with procrit.  "for 14 yr I've been getting blood work q month & getting infusions prn" (07/12/2013).  Dr. EMarin Olplocally, iron infusions done, EPO deficiency dx'd   Migraine syndrome    "not as often anymore; used to be ~ q wk" (07/12/2013)   Mixed incontinence urge and stress    Nephrolithiasis    "passed all on my own or they are still in there" (07/12/2013)   Neuroleptic induced parkinsonism (HCC) 2018   Dr. TCarles Collet neuro, saw her 11/24/17 and recommended d/c of abilify as first step.  D/c'd abilify and pt got complete recovery.   Oropharyngeal dysphagia    swallowing study speech path 05/2020. Gastric bx's showed gastritis, h pylori NEG   OSA on CPAP    prior to move to [place]--had another sleep study 10/2015 w/pulm Dr. ECamillo Flaming   Osteoarthritis    "severe; progressing fast" (07/12/2013); multiple joints-not surgical candidate for TKR (03/2015).  Triamcinolon knee injections by Dr. STessa Lerner1/2019.   Pernicious anemia 08/24/2014   Pleural plaque with presence of asbestos 07/22/2013   [place]ulmonary embol[place] ([place] 07/2013   [facility]: pt took coumadin for about 8-9 mo   Pyelonephritis    "several times over the last 30 yr" (07/12/2013)   RBBB (right bundle branch block)    [place]urrent major depression ([place]    Recurrent UTI    hx of hospitalization for pyelonephritis; started abx prophylaxis 06/2015   Syncope    Hypotensive; ED visit--Dr. HTerrence Dupontdid Cath--nonobstructive CAD, EF 55-60%.  In retrospect, suspect pt rx med misuse/polypharmacy    Past Surgical History:  Procedure Laterality Date   APPENDECTOMY  1960    AXILLARY SURGERY Left 1978   Multiple "lump" in armpit per pt   BIOPSY  06/17/2020   Gastric bx->gastritis, h pylori neg.  Procedure: BIOPSY;  Surgeon: HCarol Ada MD;  Location: [facility];  Service: Endoscopy;;   CARDIAC CATHETERIZATION  01/2013   nonobstructive CAD, EF 55-60%   CARDIOVASCULAR STRESS TEST  02/22/15   Low risk myocard perf imaging; wall motion normal, normal EF   carotid duplex doppler  10/21/2017   R vertebral flow suggestive of possible distal obstruction.  Pt declined further w/u as of 10/29/17 but need to revisit this problem periodically.   COCCYX REMOVAL  1972   DEXA  06/05/2017   T-score -3.1   DILATION AND CURETTAGE OF UTERUS  ?  1970's   ESOPHAGOGASTRODUODENOSCOPY N/A 07/19/2014   Gastritis found + in the setting of supratherapeutic INR, +plavix, + meloxicam.   ESOPHAGOGASTRODUODENOSCOPY (EGD) WITH PROPOFOL N/A 06/17/2020   NO stricture or other prob to explain pt's dysphagia, dilation was done anyway.  Gastric bx's-->gastritis, h pylori neg. Procedure: ESOPHAGOGASTRODUODENOSCOPY (EGD) WITH PROPOFOL;  Surgeon: Carol Ada, MD;  Location: [facility];  Service: Endoscopy;  Laterality: N/A;   EYE SURGERY Left 2012-2013   "injections for ~ 1 yr; don't really know what for" (07/12/2013)   HEEL SPUR SURGERY Left 2008   KNEE SURGERY  2005   LEFT HEART CATHETERIZATION WITH CORONARY ANGIOGRAM N/A 01/30/2013   Procedure: LEFT HEART CATHETERIZATION WITH CORONARY ANGIOGRAM;  Surgeon: Clent Demark, MD;  Location: [facility];  Service: Cardiovascular;  Laterality: N/A;   MALONEY DILATION  06/17/2020   Procedure: Venia Minks DILATION;  Surgeon: Carol Ada, MD;  Location: [facility];  Service: Endoscopy;;   PLANTAR FASCIA RELEASE Left 2008   SPIROMETRY  04/25/14   In hosp for acute asthma/COPD flare: mixed obstructive and restrictive lung disease. The FEV1 is severely reduced at 45% predicted.  FEV1 signif decreased compared to prior spirometry 07/23/13.   TENDON RELEASE  1996    Right forearm and hand   TOTAL ABDOMINAL HYSTERECTOMY  1974   TRANSTHORACIC ECHOCARDIOGRAM  01/2013; 04/2014;08/2015; 09/2017   2014--NORMAL.  2015--focal basal septal hypertrophy, EF 55-60%, grade I diast dysfxn, mild LAE.  08/2015 EF 55-60%, nl LV syst fxn, grade I DD, valves wnl. 10/21/17: EF 65-70%, grd I DD, o/w normal.    Outpatient Medications Prior to Visit  Medication Sig Dispense Refill   albuterol (VENTOLIN HFA) 108 (90 Base) MCG/ACT inhaler Inhale 1-2 puffs into the lungs every 6 (six) hours as needed for wheezing or shortness of breath. 18 g 1   alendronate (FOSAMAX) 70 MG tablet Take 1 tablet (70 mg total) by mouth every 7 (seven) days. Take with a full glass of water on an empty stomach, first thing in the morning and remain up right for 30 minutes after taking. 12 tablet 3   ALPRAZolam (XANAX) 1 MG tablet TAKE 1 TABLET THREE TIMES DAILY AS NEEDED FOR ANXIETY. 90 tablet 5   aspirin 81 MG tablet Take 81 mg by mouth at bedtime.     budesonide-formoterol (SYMBICORT) 80-4.5 MCG/ACT inhaler 2 puffs     cyclobenzaprine (FLEXERIL) 5 MG tablet Take 1 tablet (5 mg total) by mouth 2 (two) times daily as needed for muscle spasms. 60 tablet 3   diclofenac Sodium (VOLTAREN) 1 % GEL Apply 2 g topically 4 (four) times daily. 4 times a day to both knees 300 g 4   docusate sodium (COLACE) 100 MG capsule Take 1 capsule (100 mg total) by mouth 2 (two) times daily. 10 capsule 0   donepezil (ARICEPT) 10 MG tablet Take 1 tablet (10 mg total) by mouth at bedtime. must have office visit 90 tablet 3   DULoxetine (CYMBALTA) 30 MG capsule TAKE 1 CAPSULE ONCE DAILY ALONG WITH '60MG'$  CAPSULE FOR TOTAL '90MG'$  DAILY. (Patient taking differently: Take 30 mg by mouth See admin instructions.) 90 capsule 3   DULoxetine (CYMBALTA) 60 MG capsule TAKE 1 CAPSULE A DAY TO BE COMBINED WITH '30MG'$  CAPSULE (Patient taking differently: Take 60 mg by mouth See admin instructions.) 90 capsule 3   fluconazole (DIFLUCAN) 100 MG tablet  TAKE (1) TABLET DAILY AS NEEDED FOR THRUSH 15 tablet 3   fluticasone (FLONASE) 50 MCG/ACT nasal spray USE 2  SPRAYS IN EACH NOSTRIL ONCE DAILY 16 g 5   furosemide (LASIX) 40 MG tablet Take 1 tablet (40 mg total) by mouth daily as needed for fluid.     ibuprofen (ADVIL) 600 MG tablet Take 1 tablet (600 mg total) by mouth every 6 (six) hours as needed. 30 tablet 0   ipratropium (ATROVENT) 0.03 % nasal spray Place 2 sprays into both nostrils every 12 (twelve) hours. 30 mL 11   isosorbide mononitrate (IMDUR) 30 MG 24 hr tablet TAKE 2 TABLETS IN THE AM AND 1 TABLET IN THE PM. 270 tablet 1   lamoTRIgine (LAMICTAL) 150 MG tablet TAKE 1 TABLET EACH DAY. 90 tablet 3   lidocaine (LIDODERM) 5 % Place 1 patch onto the skin daily. Remove & Discard patch within 12 hours or as directed by MD 8 patch 0   metoprolol succinate (TOPROL-XL) 100 MG 24 hr tablet 1 tab po qd, take additional 1/2 tab as needed for bp > 160/90 135 tablet 1   metroNIDAZOLE (METROGEL) 0.75 % gel Apply 1 application topically at bedtime.     Multiple Vitamin (MULTIVITAMIN WITH MINERALS) TABS tablet Take 1 tablet by mouth daily.     ondansetron (ZOFRAN) 8 MG tablet Take 1 tablet (8 mg total) by mouth every 8 (eight) hours as needed for nausea or vomiting. 30 tablet 6   oxyCODONE-acetaminophen (PERCOCET) 10-325 MG tablet Take 1 tablet by mouth every 6 (six) hours as needed for pain. Do Not Fill Before 06/14/21 100 tablet 0   pantoprazole (PROTONIX) 40 MG tablet TAKE 1 TABLET BY MOUTH TWICE DAILY. 180 tablet 0   rosuvastatin (CRESTOR) 20 MG tablet TAKE 1 TABLET ONCE DAILY. 90 tablet 3   solifenacin (VESICARE) 10 MG tablet Take 1 tablet (10 mg total) by mouth daily. 90 tablet 3   traZODone (DESYREL) 50 MG tablet Take by mouth. 2-4 tablets at bedtime     EPINEPHrine 0.3 mg/0.3 mL IJ SOAJ injection Inject 0.3 mg into the muscle as needed for anaphylaxis. (Patient not taking: Reported on 07/16/2021) 1 each 1   mupirocin ointment (BACTROBAN) 2 % Apply 1  application topically 2 (two) times daily. Can be ointment or cream- whichever on formulary and cheapest for her (Patient not taking: Reported on 07/16/2021) 22 g 0   nitroGLYCERIN (NITROSTAT) 0.4 MG SL tablet Place 1 tablet (0.4 mg total) under the tongue every 5 (five) minutes as needed for chest pain (x 3 doses). (Patient not taking: Reported on 07/16/2021) 25 tablet 11   No facility-administered medications prior to visit.    Allergies  Allergen Reactions   Abilify [Aripiprazole] Other (See Comments)    parkinsonism   Penicillins Itching, Swelling and Rash    Tolerated Cefepime in ED. Has patient had a PCN reaction causing immediate rash, facial/tongue/throat swelling, SOB or lightheadedness with hypotension: Yes Has patient had a PCN reaction causing severe rash involving mucus membranes or skin necrosis: No Has patient had a PCN reaction that required hospitalization: No  Has patient had a PCN reaction occurring within the last 10 years: No     ROS As per HPI  PE: Vitals with BMI 07/16/2021 07/14/2021 07/14/2021  Height '5\' 4"'$  - -  Weight 142 lbs 10 oz - -  BMI 123456 - -  Systolic [id number]  Diastolic 69 86 78  Pulse 70 71 69   Gen: Alert, well appearing.  Patient is oriented to person, place, time, and situation. AFFECT: pleasant, lucid thought and speech. CV: RRR,  no m/r/g.   LUNGS: CTA bilat, nonlabored resps, good aeration in all lung fields. EXT: no clubbing or cyanosis.  no edema.  R shoulder w/out deformity.  TTP along posterolateral aspect of R shoulder.  No neck TTP.  Pain and limitation in aBduction, flexion, and ext in R shoulder.  Otherwise ROM intact w/out pain.   LABS:  Lab Results  Component Value Date   TSH 2.750 10/04/2019   Lab Results  Component Value Date   WBC 7.2 11/25/2020   HGB 13.3 11/25/2020   HCT 41.5 11/25/2020   MCV 89.4 11/25/2020   PLT 184 11/25/2020   Lab Results  Component Value Date   CREATININE 0.86 04/18/2021   BUN 18  04/18/2021   NA 138 04/18/2021   K 4.3 04/18/2021   CL 103 04/18/2021   CO2 28 04/18/2021   Lab Results  Component Value Date   ALT 13 11/24/2020   AST 18 11/24/2020   ALKPHOS 58 11/24/2020   BILITOT 0.3 11/24/2020   Lab Results  Component Value Date   CHOL 145 12/28/2020   Lab Results  Component Value Date   HDL 58.20 12/28/2020   Lab Results  Component Value Date   LDLCALC 74 12/28/2020   Lab Results  Component Value Date   TRIG 65.0 12/28/2020   Lab Results  Component Value Date   CHOLHDL 2 12/28/2020   Lab Results  Component Value Date   HGBA1C 5.6 01/02/2016   POC CC dipstick UA today: 2+ leukocytes, +blood and +protein, SG 1.025.  Otherwise normal.  IMPRESSION AND PLAN:  1) Cystitis with hematuria, in the setting of chronic urge incontinence and hx of recurrent UTI. Start bactrim DS 1 bid x 5d, sent urine for c/s. Regarding her urge incontinence, the only med that has helped some AND is affordable for her is vesicare-->continue '10mg'$  qd.  She declined urol referral today (? Post tibial nerve stim, ?operative options avail?).  2) Recurrent MDD, GAD: stable on current regimen of lamictal 150 qd, cymbalta 90 qd, and xanax '1mg'$  tid.  CSC UTD.  No new rx for alprazolam needed today.  3) Recurrent falls/debilitated pt: d/t chronic and debilitating bilat knee osteoarthritis. Ongoing care via meds + injections with Dr. Letta Pate. She states she has gotten max benefit from PT and declines any further.  4) HTN: stable on toprol xl 100 qd, imdur 30 qd. Lytes/cr today.  5) Preventative health care: Due for screening mammogram (last was 2018) and f/u DEXA (last was 2018) but she wants to defer these for now.   6) Chronic hypoxic resp failure: copd +pulm fibrosis/asbestosis. Stable, cont symbicort, oxygen prn daytime + nocturnal, albut prn.   7) HLD, nonosbt CAD: LDL goal 70.  Tolerating rosuva '20mg'$  qd. LDL was 74 six months ago. Pt not fasting today. Plan repeat  3-6 mo."  An After Visit Summary was printed and given to the patient.  FOLLOW UP: Return in about 3 months (around 10/16/2021) for routine chronic illness f/u.  Signed:  Crissie Sickles, MD           07/16/2021

## 2021-07-18 ENCOUNTER — Other Ambulatory Visit: Payer: Self-pay | Admitting: Family Medicine

## 2021-07-19 ENCOUNTER — Encounter: Payer: Self-pay | Admitting: Family Medicine

## 2021-07-19 ENCOUNTER — Telehealth: Payer: Self-pay

## 2021-07-19 ENCOUNTER — Other Ambulatory Visit: Payer: Self-pay | Admitting: Family Medicine

## 2021-07-19 LAB — URINE CULTURE
MICRO NUMBER:: 12159877
SPECIMEN QUALITY:: ADEQUATE

## 2021-07-19 MED ORDER — CIPROFLOXACIN HCL 500 MG PO TABS: 500.0000 mg | ORAL_TABLET | Freq: Two times a day (BID) | ORAL | 0 refills | Status: AC

## 2021-07-19 NOTE — Telephone Encounter (Signed)
-----   Message from Tammi Sou, MD sent at 07/19/2021  9:08 AM EDT ----- OK, stop antibiotic b/c could be sign of allergy. If pt's urinary symptoms have resolved then no further abx necessary. If she says she is still feeling like she has UTI then pls eRx cipro '500mg'$ , 1 bid x 3d, #6, no RF.

## 2021-07-19 NOTE — Telephone Encounter (Signed)
Rx sent. Spoke with pt regarding medication and instructions.

## 2021-07-23 ENCOUNTER — Other Ambulatory Visit: Payer: Self-pay | Admitting: Family Medicine

## 2021-07-23 DIAGNOSIS — Z20828 Contact with and (suspected) exposure to other viral communicable diseases: Secondary | ICD-10-CM | POA: Diagnosis not present

## 2021-07-26 DIAGNOSIS — Z20828 Contact with and (suspected) exposure to other viral communicable diseases: Secondary | ICD-10-CM | POA: Diagnosis not present

## 2021-07-30 DIAGNOSIS — Z20828 Contact with and (suspected) exposure to other viral communicable diseases: Secondary | ICD-10-CM | POA: Diagnosis not present

## 2021-08-02 ENCOUNTER — Other Ambulatory Visit: Payer: Self-pay

## 2021-08-02 ENCOUNTER — Encounter: Payer: Medicare Other | Attending: Physical Medicine & Rehabilitation | Admitting: Registered Nurse

## 2021-08-02 ENCOUNTER — Telehealth: Payer: Self-pay | Admitting: Registered Nurse

## 2021-08-02 ENCOUNTER — Encounter: Payer: Self-pay | Admitting: Registered Nurse

## 2021-08-02 VITALS — BP 131/60 | HR 64 | Temp 98.2°F | Ht 64.0 in | Wt 142.0 lb

## 2021-08-02 DIAGNOSIS — M7061 Trochanteric bursitis, right hip: Secondary | ICD-10-CM | POA: Diagnosis not present

## 2021-08-02 DIAGNOSIS — M25551 Pain in right hip: Secondary | ICD-10-CM | POA: Insufficient documentation

## 2021-08-02 DIAGNOSIS — Z79899 Other long term (current) drug therapy: Secondary | ICD-10-CM | POA: Diagnosis not present

## 2021-08-02 DIAGNOSIS — M1712 Unilateral primary osteoarthritis, left knee: Secondary | ICD-10-CM | POA: Insufficient documentation

## 2021-08-02 DIAGNOSIS — M7062 Trochanteric bursitis, left hip: Secondary | ICD-10-CM | POA: Diagnosis not present

## 2021-08-02 DIAGNOSIS — G894 Chronic pain syndrome: Secondary | ICD-10-CM | POA: Diagnosis not present

## 2021-08-02 DIAGNOSIS — M47816 Spondylosis without myelopathy or radiculopathy, lumbar region: Secondary | ICD-10-CM | POA: Insufficient documentation

## 2021-08-02 DIAGNOSIS — R296 Repeated falls: Secondary | ICD-10-CM | POA: Diagnosis not present

## 2021-08-02 DIAGNOSIS — M1711 Unilateral primary osteoarthritis, right knee: Secondary | ICD-10-CM | POA: Diagnosis not present

## 2021-08-02 DIAGNOSIS — M5416 Radiculopathy, lumbar region: Secondary | ICD-10-CM | POA: Diagnosis not present

## 2021-08-02 DIAGNOSIS — Z5181 Encounter for therapeutic drug level monitoring: Secondary | ICD-10-CM | POA: Diagnosis not present

## 2021-08-02 DIAGNOSIS — M5386 Other specified dorsopathies, lumbar region: Secondary | ICD-10-CM | POA: Diagnosis not present

## 2021-08-02 DIAGNOSIS — I2510 Atherosclerotic heart disease of native coronary artery without angina pectoris: Secondary | ICD-10-CM

## 2021-08-02 NOTE — Progress Notes (Signed)
Subjective:    Patient ID: Linda [name], female    DOB: 1946-01-02, 75 y.o.   MRN: [id number]  HPI: Linda [name] is a 75 y.o. female who returns for follow up appointment for chronic pain and medication refill. She states her pain is located in her lower back radiating into her right lower extremity and bilateral knee pain. Linda [name] reports increase intensity of bilateral knee pain and she is scheduled for Zilretta in October with [name].   Linda [name] reports she has had multiple falls and reports her bilateral knees give out. Her recent fall was  a few days ago, her female-friend stated she was walking from the bedroom to the living room and Linda [name] states her left knee gave out and she landed on her buttocks. She was able to pick herself up.   Linda [name] went to [facility] ED on 07/14/2021, she had a fall, she didn't have her walker, note was reviewed.   Linda [name] stated several times she is falling due to her increase knee pain, and her knees buckling. We discussed seeing her orthopedist, she states she doesn't have one and her next scheduled knee injection is in October. We discussed going to [facility], she states she would think about it.   Linda [name] friend asked if her medication could be increased, spoke to Linda [name] about her friend request. Also let Linda [name] know she had to speak for herself. Linda [name] stated when she went to Urgent care they gave her Oxycodone 15 mg, ED note was reviewed and she received Oxycodone 15 mg. Due to her multiple falls the above will be discussed with [name], and this provider will give her call. She verbalizes understanding.   Linda [name] educated on falls prevention, she verbalizes understanding.   She rates her pain 9. Her current exercise regime is walking short distances with her walker.   Linda [name] Morphine equivalent is 62.00 MME.   Oral Swab was Performed today.    Pain Inventory Average Pain  9 Pain Right Now 9 My pain is constant, dull, stabbing, and aching  In the last 24 hours, has pain interfered with the following? General activity 10 Relation with others 10 Enjoyment of life 10 What TIME of day is your pain at its worst? morning , daytime, evening, and night Sleep (in general) Fair  Pain is worse with: walking, bending, standing, and some activites Pain improves with: rest, medication, and injections Relief from Meds: 6  Family History  Problem Relation Age of Onset   Arthritis Mother    Kidney disease Mother    Heart disease Father    Stroke Father    Hypertension Father    Diabetes Father    Heart attack Father    Heart attack Paternal Grandmother    Diabetes Sister        one sister   Hypertension Sister    Asthma Sister    Hypertension Brother    Asthma Brother    Asthma Daughter    Multiple sclerosis Son    Social History   Socioeconomic History   Marital status: Widowed    Spouse name: Not on file   Number of children: 2   Years of education: Not on file   Highest education level: Not on file  Occupational History   Occupation: Retired    Comment: Pharmacist, 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  Relocated to [place] 09/2012 to be closer to her son who has MS.   Husband d 2015--mesothelioma.   Occupation: former Pharmacist, hospital.   Education: masters degree level.   No T/A/Ds.   Lives in [place], independent living.   Social Determinants of Health   Financial Resource Strain: Low Risk    Difficulty of Paying Living Expenses: Not hard at all  Food Insecurity: No Food Insecurity   Worried About [place] in the Last Year: Never true   [place] in the Last Year: Never true  Transportation Needs: No Transportation Needs   Lack of Transportation (Medical): No   Lack of Transportation (Non-Medical): No  Physical Activity: Insufficiently Active   Days of Exercise per Week: 2 days   Minutes of Exercise per Session: 20 min  Stress: No Stress Concern Present   Feeling of Stress : Not at all  Social Connections: Moderately Isolated   Frequency of Communication with Friends and Family: Three times a week   Frequency of Social Gatherings with Friends and Family: More than three times a week   Attends Religious Services: More than 4 times per year   Active Member of Clubs or Organizations: No   Attends Archivist Meetings: Never   Marital Status: Widowed   Past Surgical History:  Procedure Laterality Date   APPENDECTOMY  1960   AXILLARY SURGERY Left 1978   Multiple "lump" in armpit per pt   BIOPSY  06/17/2020   Gastric bx->gastritis, h pylori neg.  Procedure: BIOPSY;  Surgeon: Carol Ada, MD;  Location: [facility];  Service: Endoscopy;;   CARDIAC CATHETERIZATION  01/2013   nonobstructive CAD, EF 55-60%   CARDIOVASCULAR STRESS TEST  02/22/15   Low risk myocard perf imaging; wall motion normal, normal EF   carotid duplex doppler  10/21/2017   R vertebral flow suggestive of possible distal obstruction.  Pt declined further w/u as of 10/29/17 but need to revisit this problem periodically.   COCCYX REMOVAL  1972   DEXA  06/05/2017   T-score -3.1   DILATION AND CURETTAGE OF UTERUS  ? 1970's   ESOPHAGOGASTRODUODENOSCOPY N/A 07/19/2014   Gastritis found + in the setting of supratherapeutic INR, +plavix, + meloxicam.   ESOPHAGOGASTRODUODENOSCOPY (EGD) WITH PROPOFOL N/A 06/17/2020   NO stricture or other prob to explain pt's dysphagia, dilation was done anyway.  Gastric bx's-->gastritis, h pylori neg. Procedure: ESOPHAGOGASTRODUODENOSCOPY (EGD) WITH PROPOFOL;  Surgeon: Carol Ada, MD;  [facility]: [facility];  Service: Endoscopy;  Laterality: N/A;   EYE SURGERY Left 2012-2013   "injections for ~ 1 yr; don't really know what for" (07/12/2013)   HEEL SPUR SURGERY Left 2008   KNEE SURGERY  2005   LEFT HEART CATHETERIZATION WITH CORONARY ANGIOGRAM N/A 01/30/2013   Procedure: LEFT HEART CATHETERIZATION WITH CORONARY ANGIOGRAM;  Surgeon: Clent Demark, MD;  Location: [facility];  Service: Cardiovascular;  Laterality: N/A;   MALONEY DILATION  06/17/2020   Procedure: Venia Minks DILATION;  Surgeon: Carol Ada, MD;  Location: [facility];  Service: Endoscopy;;   PLANTAR FASCIA  RELEASE Left 2008   SPIROMETRY  04/25/14   In hosp for acute asthma/COPD flare: mixed obstructive and restrictive lung disease. The FEV1 is severely reduced at 45% predicted.  FEV1 signif decreased compared to prior spirometry 07/23/13.   TENDON RELEASE  1996   Right forearm and hand   TOTAL ABDOMINAL HYSTERECTOMY  1974   TRANSTHORACIC ECHOCARDIOGRAM  01/2013; 04/2014;08/2015; 09/2017   2014--NORMAL.  2015--focal basal septal hypertrophy, EF 55-60%, grade I diast dysfxn, mild LAE.  08/2015 EF 55-60%, nl LV syst fxn, grade I DD, valves wnl. 10/21/17: EF 65-70%, grd I DD, o/w normal.   Past Surgical History:  Procedure Laterality Date   APPENDECTOMY  1960   AXILLARY SURGERY Left 1978   Multiple "lump" in armpit per pt   BIOPSY  06/17/2020   Gastric bx->gastritis, h pylori neg.  Procedure: BIOPSY;  Surgeon: Carol Ada, MD;  [facility];  Service: Endoscopy;;   CARDIAC CATHETERIZATION  01/2013   nonobstructive CAD, EF 55-60%   CARDIOVASCULAR STRESS TEST  02/22/15   Low risk myocard perf imaging; wall motion normal, normal EF   carotid duplex doppler  10/21/2017   R vertebral flow suggestive of possible distal obstruction.  Pt declined further w/u as of 10/29/17 but need to revisit this problem periodically.   COCCYX REMOVAL  1972   DEXA  06/05/2017   T-score -3.1   DILATION AND CURETTAGE OF  UTERUS  ? 1970's   ESOPHAGOGASTRODUODENOSCOPY N/A 07/19/2014   Gastritis found + in the setting of supratherapeutic INR, +plavix, + meloxicam.   ESOPHAGOGASTRODUODENOSCOPY (EGD) WITH PROPOFOL N/A 06/17/2020   NO stricture or other prob to explain pt's dysphagia, dilation was done anyway.  Gastric bx's-->gastritis, h pylori neg. Procedure: ESOPHAGOGASTRODUODENOSCOPY (EGD) WITH PROPOFOL;  Surgeon: Carol Ada, MD;  [facility];  Service: Endoscopy;  Laterality: N/A;   EYE SURGERY Left 2012-2013   "injections for ~ 1 yr; don't really know what for" (07/12/2013)   HEEL SPUR SURGERY Left 2008   KNEE SURGERY  2005   LEFT HEART CATHETERIZATION WITH CORONARY ANGIOGRAM N/A 01/30/2013   Procedure: LEFT HEART CATHETERIZATION WITH CORONARY ANGIOGRAM;  Surgeon: Clent Demark, MD;  Location: [facility];  Service: Cardiovascular;  Laterality: N/A;   MALONEY DILATION  06/17/2020   Procedure: Venia Minks DILATION;  Surgeon: Carol Ada, MD;  [facility];  Service: Endoscopy;;   PLANTAR FASCIA RELEASE Left 2008   SPIROMETRY  04/25/14   In hosp for acute asthma/COPD flare: mixed obstructive and restrictive lung disease. The FEV1 is severely reduced at 45% predicted.  FEV1 signif decreased compared to prior spirometry 07/23/13.   TENDON RELEASE  1996   Right forearm and hand   TOTAL ABDOMINAL HYSTERECTOMY  1974   TRANSTHORACIC ECHOCARDIOGRAM  01/2013; 04/2014;08/2015; 09/2017   2014--NORMAL.  2015--focal basal septal hypertrophy, EF 55-60%, grade I diast dysfxn, mild LAE.  08/2015 EF 55-60%, nl LV syst fxn, grade I DD, valves wnl. 10/21/17: EF 65-70%, grd I DD, o/w normal.   Past Medical History:  Diagnosis Date   Acute upper GI bleed 06/2014   while pt taking coumadin, plavix, and meloxicam---despite being told not to take coumadin.   Anginal pain ([place])    Nonobstructive CAD 2014; however, her cardiologist put her on a statin for this and NOT for hyperlipidemia per pt report.  Atyp CP 08/2017 at  card f/u, plan for myoc perf imaging.   Anxiety    panic attacks   Asthma    w/ asbestososis  BPPV (benign paroxysmal positional vertigo) 12/16/2012   Chronic diastolic CHF (congestive heart failure) (HCC)    dry wt as of 11/06/16 is 168 lbs.   Chronic lower back pain    COPD (chronic obstructive pulmonary disease) (HCC)    DDD (degenerative disc disease)    lumbar and cervical.    Diverticular disease    Fibromyalgia    Patient states dx was around her late 71s but she had sx's for years prior to this.   H/O hiatal hernia    History of pneumonia    hospitalized 12/2011, 02/2013, and 07/2013 [facility]) for this   HTN (hypertension)    Renal artery dopplers 04/2013 neg for stenosis.   Hypervitaminosis D 09/27/2019   over-supplemented.  Stopped vit D and plan recheck 2 mo.   Idiopathic angio-edema-urticaria 72014; 2021   Angioedema component was very minimal.  2021->Dr. Bobbitt (allergist) eval.   Insomnia    Iron deficiency anemia    [place]emat[place] in [place], [place] did extensive w/u; no cause found; failed oral supplement;; gets fairly regular (q65mor so) IV iron infusions (Venofer -iron sucrose- '200mg'$  with procrit.  "for 14 yr I've been getting blood work q month & getting infusions prn" (07/12/2013).  Dr. EMarin Olplocally, iron infusions done, EPO deficiency dx'd   Migraine syndrome    "not as often anymore; used to be ~ q wk" (07/12/2013)   Mixed incontinence urge and stress    Nephrolithiasis    "passed all on my own or they are still in there" (07/12/2013)   Neuroleptic induced parkinsonism (HCC) 2018   Dr. TCarles Collet neuro, saw her 11/24/17 and recommended d/c of abilify as first step.  D/c'd abilify and pt got complete recovery.   Oropharyngeal dysphagia    swallowing study speech path 05/2020. Gastric bx's showed gastritis, h pylori NEG   OSA on CPAP    prior to move to [place]--had another sleep study 10/2015 w/pulm Dr. ECamillo Flaming   Osteoarthritis    "severe; progressing fast" (07/12/2013); multiple  joints-not surgical candidate for TKR (03/2015).  Triamcinolon knee injections by Dr. STessa Lerner1/2019.   Pernicious anemia 08/24/2014   Pleural plaque with presence of asbestos 07/22/2013   [place] 07[place]2013   [facility] pt took coumadin for about 8-9 mo   Pyelonephritis    "several times over the last 30 yr" (07/12/2013)   RBBB (right bundle branch block)    Recurrent major depression ([place]    Recurrent UTI    hx of hospitalization for pyelonephritis; started abx prophylaxis 06/2015   Syncope    Hypotensive; ED visit--Dr. HTerrence Dupontdid Cath--nonobstructive CAD, EF 55-60%.  In retrospect, suspect pt rx med misuse/polypharmacy   BP 131/60   Pulse 64   Temp 98.2 F (36.8 C) (Oral)   Ht '5\' 4"'$  (1.626 m)   Wt 142 lb (64.4 kg)   SpO2 92%   BMI 24.37 kg/m   Opioid Risk Score:   Fall Risk Score:  `1  Depression screen PHQ 2/9  [facility] 07/04/2021 07/04/2021 03/15/2021 12/13/2020 11/01/2020 09/29/2020 07/24/2020  Decreased Interest 0 0 '1 1 1 '$ 0 1  Down, Depressed, Hopeless 0 0 '1 1 1 '$ 0 1  PHQ - 2 Score 0 0 '2 2 2 '$ 0 2  Altered sleeping - - - - - 1 -  Tired, decreased energy - - - - - 3 -  Change in appetite - - - - - 3 -  Feeling bad or failure about yourself  - - - - - 0 -  Trouble concentrating - - - - - 3 -  Moving slowly or fidgety/restless - - - - - 0 -  Suicidal thoughts - - - - - 0 -  PHQ-9 Score - - - - - 10 -  Difficult doing work/chores - - - - - - -  Some recent data might be hidden       Review of Systems  Constitutional: Negative.   HENT: Negative.    Eyes: Negative.   Respiratory: Negative.    Cardiovascular: Negative.   Gastrointestinal: Negative.   Endocrine: Negative.   Genitourinary: Negative.   Musculoskeletal:  Positive for back pain and gait problem.       Pain in right shoulder pain in both knees,   Skin: Negative.   Allergic/Immunologic: Negative.   Hematological: Negative.    Psychiatric/Behavioral: Negative.        Objective:   Physical Exam Vitals and nursing note reviewed.  Constitutional:      Appearance: Normal appearance.  Cardiovascular:     Rate and Rhythm: Normal rate and regular rhythm.     Pulses: Normal pulses.     Heart sounds: Normal heart sounds.  Pulmonary:     Effort: Pulmonary effort is normal.     Breath sounds: Normal breath sounds.  Musculoskeletal:     Cervical back: Normal range of motion and neck supple.     Comments: Normal Muscle Bulk and Muscle Testing Reveals:  Upper Extremities: Decreased ROM 90 Degrees and Muscle Strength 5/5 Bilateral AC Joint Tenderness Thoracic Paraspinal Tenderness: T-7-T-9 Lumbar Paraspinal Tenderness: L-3-L-5 Lower Extremities: Decreased ROM and Muscle Strength 5/5 Bilateral Lower Extremities Flexion Produces Pain into Bilateral Patellas Arises from Table Slowly Using Walker for support Antalgic  Gait     Skin:    General: Skin is warm and dry.  Neurological:     Mental Status: She is alert and oriented to person, place, and time.  Psychiatric:        Mood and Affect: Mood normal.        Behavior: Behavior normal.         Assessment & Plan:  1. Functional deficits secondary to Gait disorder:Continue with HEP. 08/02/2021. 2. Chronic Back pain/ Lumbar Radiculitis/fibromyalgia /R>L Knee OA Pain Management: 08/02/2021 Continue: Oxycodone 10/325 mg one tablet every 6 hours #100.   Continue Voltaren Gel. We will continue the opioid monitoring program, this consists of regular clinic visits, examinations, urine drug screen, pill counts as well as use of [place] Controlled Substance Reporting system. A 12 month History has been reviewed on the [place] Controlled Substance Reporting System on 08/02/2021 3. Depression with anxiety/Grief reaction/Mood: 08/02/2021 Continue Xanax, Trazodone and Cymbalta . PCP Following.  4. [name] with asthma:. Albuterol prn. :Pulmonology Following.  08/02/2021 5. Bilateral Osteoarthritis of Bilateral Knees: Ms. Trzaska Requesting Bilateral Zilretta Injection, she will be scheduled with [name] for Bilateral Zilretta Injection with [name], she verbalizes understanding. S/P Bilateral Zilretta Injections on 01/24/2021 with good relief noted. Continue to Monitor. 08/02/2021 6. Fibromyalgia: Continue Home exercise regimen as tolerated. Continue to Monitor.08/02/2021 7. Cervicalgia/ Cervical Radiculitis: No complaints today.Continue HEP as tolerated. Alternate with heat and ice therapy. Continue to monitor. 08/02/2021 8. Bilateral  Greater Trochanteric Bursitis: Continue to  Alternate Ice and Heat Therapy. Continue to Monitor. 08/02/2021. 9. Chronic Bilateral Shoulder Pain:  No complaints  Today. Continue HEP as Tolerated. Continue current medication regimen.  Continue to Monitor. 08/02/2021 10. Multiple Falls: Educated on [employer]. She verbalizes understanding.     F/U in 2 months

## 2021-08-02 NOTE — Telephone Encounter (Signed)
Dr. Naaman Plummer,  I haven't seen Linda [name] since March 2022. Todat she had a gentleman in the room with her, he asked if her medication could be increased. I spoke to Linda [name] regarding his request and she had to speak up for herself. She has had multiples falls and was seen in Urgent care last month, she received Oxycodone 15 mg. Due to her mutiple falls and her friend request , I am very hesitant. Not sure if he was present for your last two visits. Linda [name] asked if I would forward her request to you relating to the Oxycodone 15 mg, I will await your response. She didn't receive a prescription today.

## 2021-08-03 ENCOUNTER — Telehealth: Payer: Self-pay | Admitting: Registered Nurse

## 2021-08-03 DIAGNOSIS — M1711 Unilateral primary osteoarthritis, right knee: Secondary | ICD-10-CM

## 2021-08-03 DIAGNOSIS — M1712 Unilateral primary osteoarthritis, left knee: Secondary | ICD-10-CM

## 2021-08-03 MED ORDER — OXYCODONE-ACETAMINOPHEN 10-325 MG PO TABS: 1.0000 | ORAL_TABLET | Freq: Four times a day (QID) | ORAL | 0 refills | Status: DC | PRN

## 2021-08-03 NOTE — Telephone Encounter (Signed)
[name] responded to phone message from 08/02/2021. Ms. Hady Oxycodone will remain the same. Placed a call to Ms. Deoliveira regarding the above, she verbalizes understanding.  Ms.. [name] stated the man who was in the room with her, was her boyfriend.

## 2021-08-06 DIAGNOSIS — Z20828 Contact with and (suspected) exposure to other viral communicable diseases: Secondary | ICD-10-CM | POA: Diagnosis not present

## 2021-08-09 DIAGNOSIS — Z20828 Contact with and (suspected) exposure to other viral communicable diseases: Secondary | ICD-10-CM | POA: Diagnosis not present

## 2021-08-09 LAB — DRUG TOX MONITOR 1 W/CONF, ORAL FLD
Alprazolam: 18.28 ng/mL — ABNORMAL HIGH (ref <0.50)
Amphetamines: NEGATIVE ng/mL (ref <10)
Barbiturates: NEGATIVE ng/mL (ref <10)
Benzodiazepines: POSITIVE ng/mL — AB (ref <0.50)
Buprenorphine: NEGATIVE ng/mL (ref <0.10)
Chlordiazepoxide: NEGATIVE ng/mL (ref <0.50)
Clonazepam: NEGATIVE ng/mL (ref <0.50)
Cocaine: NEGATIVE ng/mL (ref <5.0)
Codeine: NEGATIVE ng/mL (ref <2.5)
Diazepam: NEGATIVE ng/mL (ref <0.50)
Dihydrocodeine: NEGATIVE ng/mL (ref <2.5)
Fentanyl: NEGATIVE ng/mL (ref <0.10)
Flunitrazepam: NEGATIVE ng/mL (ref <0.50)
Flurazepam: NEGATIVE ng/mL (ref <0.50)
Heroin Metabolite: NEGATIVE ng/mL (ref <1.0)
Hydrocodone: NEGATIVE ng/mL (ref <2.5)
Hydromorphone: NEGATIVE ng/mL (ref <2.5)
Lorazepam: NEGATIVE ng/mL (ref <0.50)
MARIJUANA: NEGATIVE ng/mL (ref <2.5)
MDMA: NEGATIVE ng/mL (ref <10)
Meprobamate: NEGATIVE ng/mL (ref <2.5)
Methadone: NEGATIVE ng/mL (ref <5.0)
Midazolam: NEGATIVE ng/mL (ref <0.50)
Morphine: NEGATIVE ng/mL (ref <2.5)
Nicotine Metabolite: NEGATIVE ng/mL (ref <5.0)
Nordiazepam: NEGATIVE ng/mL (ref <0.50)
Norhydrocodone: NEGATIVE ng/mL (ref <2.5)
Noroxycodone: >250 ng/mL — ABNORMAL HIGH (ref <2.5)
Opiates: POSITIVE ng/mL — AB (ref <2.5)
Oxazepam: NEGATIVE ng/mL (ref <0.50)
Oxycodone: >250 ng/mL — ABNORMAL HIGH (ref <2.5)
Phencyclidine: NEGATIVE ng/mL (ref <10)
Tapentadol: NEGATIVE ng/mL (ref <5.0)
Temazepam: NEGATIVE ng/mL (ref <0.50)
Tramadol: NEGATIVE ng/mL (ref <5.0)
Triazolam: NEGATIVE ng/mL (ref <0.50)
Zolpidem: NEGATIVE ng/mL (ref <5.0)

## 2021-08-09 LAB — DRUG TOX ALC METAB W/CON, ORAL FLD: Alcohol Metabolite: NEGATIVE ng/mL (ref <25)

## 2021-08-09 LAB — DRUG TOX METHYLPHEN W/CONF,ORAL FLD: Methylphenidate: NEGATIVE ng/mL (ref <1.0)

## 2021-08-13 ENCOUNTER — Telehealth: Payer: Self-pay | Admitting: *Deleted

## 2021-08-13 DIAGNOSIS — Z20828 Contact with and (suspected) exposure to other viral communicable diseases: Secondary | ICD-10-CM | POA: Diagnosis not present

## 2021-08-13 NOTE — Telephone Encounter (Signed)
Oral swab drug screen was consistent for prescribed medications.  ?

## 2021-08-15 ENCOUNTER — Other Ambulatory Visit: Payer: Self-pay

## 2021-08-15 ENCOUNTER — Encounter: Payer: Self-pay | Admitting: Physical Medicine & Rehabilitation

## 2021-08-15 ENCOUNTER — Encounter (HOSPITAL_BASED_OUTPATIENT_CLINIC_OR_DEPARTMENT_OTHER): Payer: Medicare Other | Admitting: Physical Medicine & Rehabilitation

## 2021-08-15 VITALS — BP 163/80 | HR 59 | Temp 98.1°F

## 2021-08-15 DIAGNOSIS — Z5181 Encounter for therapeutic drug level monitoring: Secondary | ICD-10-CM | POA: Diagnosis not present

## 2021-08-15 DIAGNOSIS — M1711 Unilateral primary osteoarthritis, right knee: Secondary | ICD-10-CM | POA: Diagnosis not present

## 2021-08-15 DIAGNOSIS — M1712 Unilateral primary osteoarthritis, left knee: Secondary | ICD-10-CM | POA: Diagnosis not present

## 2021-08-15 DIAGNOSIS — M5416 Radiculopathy, lumbar region: Secondary | ICD-10-CM | POA: Diagnosis not present

## 2021-08-15 DIAGNOSIS — Z79899 Other long term (current) drug therapy: Secondary | ICD-10-CM | POA: Diagnosis not present

## 2021-08-15 DIAGNOSIS — G894 Chronic pain syndrome: Secondary | ICD-10-CM | POA: Diagnosis not present

## 2021-08-15 DIAGNOSIS — M47816 Spondylosis without myelopathy or radiculopathy, lumbar region: Secondary | ICD-10-CM | POA: Diagnosis not present

## 2021-08-15 DIAGNOSIS — M25551 Pain in right hip: Secondary | ICD-10-CM

## 2021-08-15 DIAGNOSIS — M5386 Other specified dorsopathies, lumbar region: Secondary | ICD-10-CM

## 2021-08-15 NOTE — Progress Notes (Signed)
PROCEDURE NOTE  DIAGNOSIS:  OA bilateral knees  INTERVENTION:   ZILRETTA INJECTION     After informed consent and preparation of the skin with betadine and isopropyl alcohol, I injected '32MG'$  of zilretta dissolved into 5cc of diluent into the bilateral knees via anterolateral approach. Contents of syring were shaken vigorously and aspiration was performed prior to injection. The patient tolerated well, and no complications were encountered. Afterward the area was cleaned and dressed. Post- injection instructions were provided including ice  if swelling or pain should occur.    Meredith Staggers, MD, [facility] 08/15/2021     Discussed geniculate nerve blocks. She would like proceed with these as well. Will schedule with dr Letta Pate.

## 2021-08-15 NOTE — Patient Instructions (Addendum)
PLEASE FEEL FREE TO CALL OUR OFFICE WITH ANY PROBLEMS OR QUESTIONS GU:[phone])  ICE TO YOUR RIGHT HIP, STRETCH IT DAILY, TWO-THREE X'S ESPECIALLY TRYING TO CROSS THE RIGHT LEG OVER THE LEFT.

## 2021-08-17 ENCOUNTER — Ambulatory Visit
Admission: RE | Admit: 2021-08-17 | Discharge: 2021-08-17 | Disposition: A | Discharge status: Home / Self Care | Payer: Medicare Other | Source: Ambulatory Visit | Attending: Physical Medicine & Rehabilitation | Admitting: Physical Medicine & Rehabilitation

## 2021-08-17 DIAGNOSIS — M25551 Pain in right hip: Secondary | ICD-10-CM

## 2021-08-17 DIAGNOSIS — M5386 Other specified dorsopathies, lumbar region: Secondary | ICD-10-CM

## 2021-08-17 DIAGNOSIS — M1611 Unilateral primary osteoarthritis, right hip: Secondary | ICD-10-CM | POA: Diagnosis not present

## 2021-08-17 DIAGNOSIS — M545 Low back pain, unspecified: Secondary | ICD-10-CM | POA: Diagnosis not present

## 2021-08-20 DIAGNOSIS — Z20828 Contact with and (suspected) exposure to other viral communicable diseases: Secondary | ICD-10-CM | POA: Diagnosis not present

## 2021-08-23 DIAGNOSIS — M47816 Spondylosis without myelopathy or radiculopathy, lumbar region: Secondary | ICD-10-CM | POA: Insufficient documentation

## 2021-08-23 NOTE — Telephone Encounter (Signed)
I spoke with pt re: xray findings. Will pursue lumbar MRI. She denies a pacer or any contraindication for study

## 2021-08-30 DIAGNOSIS — Z20828 Contact with and (suspected) exposure to other viral communicable diseases: Secondary | ICD-10-CM | POA: Diagnosis not present

## 2021-09-03 ENCOUNTER — Telehealth: Payer: Self-pay

## 2021-09-03 DIAGNOSIS — N3946 Mixed incontinence: Secondary | ICD-10-CM

## 2021-09-03 DIAGNOSIS — N390 Urinary tract infection, site not specified: Secondary | ICD-10-CM

## 2021-09-03 NOTE — Telephone Encounter (Signed)
Patient states [name] mentioned patient going to see Urology doctor at her last visit. She was not interested at that time.  She has made the decision to go see a urologist. Does [name] need to see her before referring? She is scheduled for 3 month follow up on 10/20 with [name].  Please advise [phone]

## 2021-09-03 NOTE — Telephone Encounter (Signed)
Referral placed.

## 2021-09-03 NOTE — Telephone Encounter (Signed)
Please advise if referral is okay

## 2021-09-04 ENCOUNTER — Telehealth: Payer: Self-pay | Admitting: *Deleted

## 2021-09-04 NOTE — Telephone Encounter (Signed)
Linda [name] would like some information on her procedure 10/18/21 with Dr Letta Pate. I called and reviewed procedure and arrival time.

## 2021-09-06 DIAGNOSIS — Z20828 Contact with and (suspected) exposure to other viral communicable diseases: Secondary | ICD-10-CM | POA: Diagnosis not present

## 2021-09-13 DIAGNOSIS — Z20828 Contact with and (suspected) exposure to other viral communicable diseases: Secondary | ICD-10-CM | POA: Diagnosis not present

## 2021-09-18 ENCOUNTER — Ambulatory Visit
Admission: RE | Admit: 2021-09-18 | Discharge: 2021-09-18 | Disposition: A | Discharge status: Home / Self Care | Payer: Medicare Other | Source: Ambulatory Visit | Attending: Physical Medicine & Rehabilitation | Admitting: Physical Medicine & Rehabilitation

## 2021-09-18 ENCOUNTER — Other Ambulatory Visit: Payer: Self-pay

## 2021-09-18 ENCOUNTER — Encounter: Payer: Self-pay | Admitting: Hematology & Oncology

## 2021-09-18 DIAGNOSIS — M47816 Spondylosis without myelopathy or radiculopathy, lumbar region: Secondary | ICD-10-CM

## 2021-09-18 DIAGNOSIS — M545 Low back pain, unspecified: Secondary | ICD-10-CM | POA: Diagnosis not present

## 2021-09-19 ENCOUNTER — Other Ambulatory Visit: Payer: Self-pay

## 2021-09-19 ENCOUNTER — Telehealth: Payer: Self-pay | Admitting: Physical Medicine & Rehabilitation

## 2021-09-19 DIAGNOSIS — M1712 Unilateral primary osteoarthritis, left knee: Secondary | ICD-10-CM

## 2021-09-19 DIAGNOSIS — M1711 Unilateral primary osteoarthritis, right knee: Secondary | ICD-10-CM

## 2021-09-19 NOTE — Telephone Encounter (Signed)
Linda [name] would like to get knee injections ASAP. She has an appointment to see Dr. Letta Pate on 10/18/2021.  But want some kind of injection before the appointment.   And she will be out of the Oxycodone 10-325 on Oct 12th, 2022. Patient wanted to make sure her refills will be available. So she will not run out of medication.

## 2021-09-19 NOTE — Telephone Encounter (Signed)
Patient states she is in a lot of pain in her knee would like to be squeezed in, she has appt with Dr Letta Pate on 10/27 for Nerve block in knee, She is requesting Zillretta injection ASAP. Please advise if we may schedule for injection

## 2021-09-24 MED ORDER — OXYCODONE-ACETAMINOPHEN 10-325 MG PO TABS: 1.0000 | ORAL_TABLET | Freq: Four times a day (QID) | ORAL | 0 refills | Status: DC | PRN

## 2021-09-24 NOTE — Telephone Encounter (Signed)
I called and reviewed MRI findings with Linda [name]. I refilled her percocet for 10/9

## 2021-09-27 DIAGNOSIS — Z8616 Personal history of COVID-19: Secondary | ICD-10-CM | POA: Diagnosis not present

## 2021-09-29 ENCOUNTER — Other Ambulatory Visit: Payer: Self-pay | Admitting: Family Medicine

## 2021-10-01 ENCOUNTER — Other Ambulatory Visit: Payer: Self-pay | Admitting: Family Medicine

## 2021-10-01 NOTE — Telephone Encounter (Signed)
RF request for trazodone LOV: 07/16/21 Next ov: 10/11/21 Last written: 09/04/20-11/25/20, stop taking at discharge  Please review and advise, med pending

## 2021-10-01 NOTE — Telephone Encounter (Signed)
RF request for trazodone LOV: 07/16/21 Next ov: 10/11/21 Last written: 09/04/20-11/25/20, stop taking at discharge

## 2021-10-03 ENCOUNTER — Ambulatory Visit: Payer: Medicare Other | Admitting: Physical Medicine & Rehabilitation

## 2021-10-04 DIAGNOSIS — Z8616 Personal history of COVID-19: Secondary | ICD-10-CM | POA: Diagnosis not present

## 2021-10-08 ENCOUNTER — Telehealth: Payer: Self-pay | Admitting: Cardiology

## 2021-10-08 MED ORDER — ISOSORBIDE MONONITRATE ER 30 MG PO TB24: ORAL_TABLET | ORAL | 0 refills | Status: DC

## 2021-10-08 NOTE — Telephone Encounter (Signed)
*  STAT* If patient is at the pharmacy, call can be transferred to refill team.   1. Which medications need to be refilled? (please list name of each medication and dose if known)  isosorbide mononitrate (IMDUR) 30 MG 24 hr tablet  2. Which pharmacy/location (including street and city if local pharmacy) is medication to be sent to? [place], [address]  3. Do they need a 30 day or 90 day supply? 90 day supply   Has appointment scheduled for 10/16/21. Patient only has 1 tablet left.

## 2021-10-09 ENCOUNTER — Other Ambulatory Visit: Payer: Self-pay | Admitting: *Deleted

## 2021-10-10 NOTE — Progress Notes (Signed)
HPI: FU diastolic congestive heart failure. Cardiac catheterization February 2014 showed nonobstructive disease (most significant 50-60 RCA) and normal LV function; note of 4+ MR. Renal Dopplers May 2014 showed no stenosis. Also with history of presumed orthostatic syncope. She has had problems with diastolic CHF. Nuclear study October 2018 showed ejection fraction 81%, probable shifting breast attenuation though very mild distal anteroseptal ischemia cannot be excluded. Treated medically. Echocardiogram October 2018 showed normal LV function, grade 1 diastolic dysfunction. Carotid Dopplers October 2018 showed 1 to 39% bilateral stenosis. Since last seen, she denies dyspnea, chest pain, palpitations or syncope.  No pedal edema.  Current Outpatient Medications  Medication Sig Dispense Refill   albuterol (VENTOLIN HFA) 108 (90 Base) MCG/ACT inhaler Inhale 1-2 puffs into the lungs every 6 (six) hours as needed for wheezing or shortness of breath. 18 g 1   alendronate (FOSAMAX) 70 MG tablet Take 1 tablet (70 mg total) by mouth every 7 (seven) days. Take with a full glass of water on an empty stomach, first thing in the morning and remain up right for 30 minutes after taking. 12 tablet 3   ALPRAZolam (XANAX) 1 MG tablet TAKE 1 TABLET THREE TIMES DAILY AS NEEDED FOR ANXIETY. 90 tablet 5   aspirin 81 MG tablet Take 81 mg by mouth at bedtime.     budesonide-formoterol (SYMBICORT) 80-4.5 MCG/ACT inhaler 2 puffs     cyclobenzaprine (FLEXERIL) 5 MG tablet Take 1 tablet (5 mg total) by mouth 2 (two) times daily as needed for muscle spasms. 60 tablet 3   diclofenac Sodium (VOLTAREN) 1 % GEL Apply 2 g topically 4 (four) times daily. 4 times a day to both knees 300 g 4   donepezil (ARICEPT) 10 MG tablet Take 1 tablet (10 mg total) by mouth at bedtime. must have office visit 90 tablet 3   DULoxetine (CYMBALTA) 30 MG capsule TAKE 1 CAPSULE ONCE DAILY ALONG WITH 60MG  CAPSULE FOR TOTAL 90MG  DAILY. 90 capsule 1    DULoxetine (CYMBALTA) 60 MG capsule TAKE 1 CAPSULE A DAY TO BE COMBINED WITH 30MG  CAPSULE 90 capsule 1   EPINEPHrine 0.3 mg/0.3 mL IJ SOAJ injection Inject 0.3 mg into the muscle as needed for anaphylaxis. 1 each 1   fluconazole (DIFLUCAN) 100 MG tablet TAKE (1) TABLET DAILY AS NEEDED FOR THRUSH 15 tablet 3   fluticasone (FLONASE) 50 MCG/ACT nasal spray USE 2 SPRAYS IN EACH NOSTRIL ONCE DAILY 16 g 5   furosemide (LASIX) 40 MG tablet Take 1 tablet (40 mg total) by mouth daily as needed for fluid.     ibuprofen (ADVIL) 600 MG tablet Take 1 tablet (600 mg total) by mouth every 6 (six) hours as needed. 30 tablet 0   ipratropium (ATROVENT) 0.03 % nasal spray Place 2 sprays into both nostrils every 12 (twelve) hours. 30 mL 11   isosorbide mononitrate (IMDUR) 30 MG 24 hr tablet TAKE 2 TABLETS IN THE AM AND 1 TABLET IN THE PM. 30 tablet 0   lamoTRIgine (LAMICTAL) 150 MG tablet TAKE 1 TABLET EACH DAY. 90 tablet 3   lidocaine (LIDODERM) 5 % Place 1 patch onto the skin daily. Remove & Discard patch within 12 hours or as directed by MD 8 patch 0   metoprolol succinate (TOPROL-XL) 100 MG 24 hr tablet 1 tab po qd, take additional 1/2 tab as needed for bp > 160/90 135 tablet 1   metroNIDAZOLE (METROGEL) 0.75 % gel Apply 1 application topically at bedtime.  Multiple Vitamin (MULTIVITAMIN WITH MINERALS) TABS tablet Take 1 tablet by mouth daily.     nitroGLYCERIN (NITROSTAT) 0.4 MG SL tablet Place 1 tablet (0.4 mg total) under the tongue every 5 (five) minutes as needed for chest pain (x 3 doses). 25 tablet 11   ondansetron (ZOFRAN) 8 MG tablet Take 1 tablet (8 mg total) by mouth every 8 (eight) hours as needed for nausea or vomiting. 30 tablet 6   oxyCODONE-acetaminophen (PERCOCET) 10-325 MG tablet Take 1 tablet by mouth every 6 (six) hours as needed for pain. Do Not Fill Before 09/30/2021 100 tablet 0   pantoprazole (PROTONIX) 40 MG tablet TAKE 1 TABLET BY MOUTH TWICE DAILY. 180 tablet 0   rosuvastatin (CRESTOR)  20 MG tablet TAKE 1 TABLET ONCE DAILY. 90 tablet 3   solifenacin (VESICARE) 10 MG tablet Take 1 tablet (10 mg total) by mouth daily. 90 tablet 3   traZODone (DESYREL) 50 MG tablet TAKE 2-4 TABLETS AT BEDTIME 120 tablet 11   docusate sodium (COLACE) 100 MG capsule Take 1 capsule (100 mg total) by mouth 2 (two) times daily. (Patient not taking: Reported on 10/16/2021) 10 capsule 0   mupirocin ointment (BACTROBAN) 2 % Apply 1 application topically 2 (two) times daily. Can be ointment or cream- whichever on formulary and cheapest for her (Patient not taking: Reported on 10/16/2021) 22 g 0   No current facility-administered medications for this visit.     Past Medical History:  Diagnosis Date   Acute upper GI bleed 06/2014   while pt taking coumadin, plavix, and meloxicam---despite being told not to take coumadin.   Anginal pain ([place])    Nonobstructive CAD 2014; however, her cardiologist put her on a statin for this and NOT for hyperlipidemia per pt report.  Atyp CP 08/2017 at card f/u, plan for myoc perf imaging.   Anxiety    panic attacks   Asthma    w/ asbestososis    BPPV (benign paroxysmal positional vertigo) 12/16/2012   Chronic diastolic CHF (congestive heart failure) (HCC)    dry wt as of 11/06/16 is 168 lbs.   Chronic lower back pain    COPD (chronic obstructive pulmonary disease) (HCC)    DDD (degenerative disc disease)    lumbar and cervical.    Diverticular disease    Fibromyalgia    Patient states dx was around her late 23s but she had sx's for years prior to this.   H/O hiatal hernia    History of pneumonia    hospitalized 12/2011, 02/2013, and 07/2013 [facility]) for this   HTN (hypertension)    Renal artery dopplers 04/2013 neg for stenosis.   Hypervitaminosis D 09/27/2019   over-supplemented.  Stopped vit D and plan recheck 2 mo.   Idiopathic angio-edema-urticaria 72014; 2021   Angioedema component was very minimal.  2021->Dr. Bobbitt (allergist) eval.   Insomnia    Iron  deficiency anemia    Hematologist in [place], [place] did extensive w/u; no cause found; failed oral supplement;; gets fairly regular (q54m or so) IV iron infusions (Venofer -iron sucrose- 200mg  with procrit.  "for 14 yr I've been getting blood work q month & getting infusions prn" (07/12/2013).  Dr. Marin Olp locally, iron infusions done, EPO deficiency dx'd   Migraine syndrome    "not as often anymore; used to be ~ q wk" (07/12/2013)   Mixed incontinence urge and stress    Nephrolithiasis    "passed all on my own or they are still in there" (07/12/2013)  Neuroleptic induced parkinsonism ([place]) 2018   Dr. Carles Collet, neuro, saw her 11/24/17 and recommended d/c of abilify as first step.  D/c'd abilify and pt got complete recovery.   Oropharyngeal dysphagia    swallowing study speech path 05/2020. Gastric bx's showed gastritis, h pylori NEG   OSA on CPAP    prior to move to [place]--had another sleep study 10/2015 w/pulm Dr. Camillo Flaming.   Osteoarthritis    "severe; progressing fast" (07/12/2013); multiple joints-not surgical candidate for TKR (03/2015).  Triamcinolon knee injections by Dr. Tessa Lerner 12/2017.   Pernicious anemia 08/24/2014   Pleural plaque with presence of asbestos 07/22/2013   Pulmonary embolism ([place]) 07/2013   Dx at [facility] with very small peripheral upper lobe pe 07/2013: pt took coumadin for about 8-9 mo   Pyelonephritis    "several times over the last 30 yr" (07/12/2013)   RBBB (right bundle branch block)    Recurrent major depression ([place])    Recurrent UTI    hx of hospitalization for pyelonephritis; started abx prophylaxis 06/2015   Syncope    Hypotensive; ED visit--Dr. Terrence Dupont did Cath--nonobstructive CAD, EF 55-60%.  In retrospect, suspect pt rx med misuse/polypharmacy    Past Surgical History:  Procedure Laterality Date   APPENDECTOMY  1960   AXILLARY SURGERY Left 1978   Multiple "lump" in armpit per pt   BIOPSY  06/17/2020   Gastric bx->gastritis, h pylori neg.  Procedure: BIOPSY;  Surgeon: Carol Ada, MD;  Location: [facility];  Service: Endoscopy;;   CARDIAC CATHETERIZATION  01/2013   nonobstructive CAD, EF 55-60%   CARDIOVASCULAR STRESS TEST  02/22/15   Low risk myocard perf imaging; wall motion normal, normal EF   carotid duplex doppler  10/21/2017   R vertebral flow suggestive of possible distal obstruction.  Pt declined further w/u as of 10/29/17 but need to revisit this problem periodically.   COCCYX REMOVAL  1972   DEXA  06/05/2017   T-score -3.1   DILATION AND CURETTAGE OF UTERUS  ? 1970's   ESOPHAGOGASTRODUODENOSCOPY N/A 07/19/2014   Gastritis found + in the setting of supratherapeutic INR, +plavix, + meloxicam.   ESOPHAGOGASTRODUODENOSCOPY (EGD) WITH PROPOFOL N/A 06/17/2020   NO stricture or other prob to explain pt's dysphagia, dilation was done anyway.  Gastric bx's-->gastritis, h pylori neg. Procedure: ESOPHAGOGASTRODUODENOSCOPY (EGD) WITH PROPOFOL;  Surgeon: Carol Ada, MD;  Location: [facility];  Service: Endoscopy;  Laterality: N/A;   EYE SURGERY Left 2012-2013   "injections for ~ 1 yr; don't really know what for" (07/12/2013)   HEEL SPUR SURGERY Left 2008   KNEE SURGERY  2005   LEFT HEART CATHETERIZATION WITH CORONARY ANGIOGRAM N/A 01/30/2013   Procedure: LEFT HEART CATHETERIZATION WITH CORONARY ANGIOGRAM;  Surgeon: Clent Demark, MD;  Location: [facility];  Service: Cardiovascular;  Laterality: N/A;   MALONEY DILATION  06/17/2020   Procedure: Venia Minks DILATION;  Surgeon: Carol Ada, MD;  Location: [facility];  Service: Endoscopy;;   PLANTAR FASCIA RELEASE Left 2008   SPIROMETRY  04/25/14   In hosp for acute asthma/COPD flare: mixed obstructive and restrictive lung disease. The FEV1 is severely reduced at 45% predicted.  FEV1 signif decreased compared to prior spirometry 07/23/13.   TENDON RELEASE  1996   Right forearm and hand   TOTAL ABDOMINAL HYSTERECTOMY  1974   TRANSTHORACIC ECHOCARDIOGRAM  01/2013; 04/2014;08/2015; 09/2017   2014--NORMAL.  2015--focal  basal septal hypertrophy, EF 55-60%, grade I diast dysfxn, mild LAE.  08/2015 EF 55-60%, nl LV syst fxn,  grade I DD, valves wnl. 10/21/17: EF 65-70%, grd I DD, o/w normal.    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  Relocated to [place] 09/2012 to be closer to her son who has MS.   Husband d 2015--mesothelioma.   Occupation: former Pharmacist, hospital.   Education: masters degree level.   No T/A/Ds.   Lives in [place], independent living.   Social Determinants of Health   Financial Resource Strain: Low Risk    Difficulty of Paying Living Expenses: Not hard at all  Food Insecurity: No Food Insecurity   Worried About Charity fundraiser in the Last Year: Never true   [place] in the Last Year: Never true  Transportation Needs: No Transportation Needs   Lack of Transportation (Medical): No   Lack of Transportation (Non-Medical): No  Physical Activity: Insufficiently Active   Days of Exercise per Week: 2 days   Minutes of Exercise per Session: 20 min  Stress: No Stress Concern Present   Feeling of Stress : Not at all  Social Connections: Moderately Isolated   Frequency of Communication with Friends and Family: Three times a week   Frequency of Social Gatherings with Friends and Family: More than three times a week   Attends Religious Services: More than 4 times per year   Active Member of Clubs or  Organizations: No   Attends Archivist Meetings: Never   Marital Status: Widowed  Human resources officer Violence: Not At Risk   Fear of Current or Ex-Partner: No   Emotionally Abused: No   Physically Abused: No   Sexually Abused: No    Family History  Problem Relation Age of Onset   Arthritis Mother    Kidney disease Mother    Heart disease Father    Stroke Father    Hypertension Father    Diabetes Father    Heart attack Father    Heart attack Paternal Grandmother    Diabetes Sister        one sister   Hypertension Sister    Asthma Sister    Hypertension Brother    Asthma Brother    Asthma Daughter    Multiple sclerosis Son     ROS: no fevers or chills, productive cough, hemoptysis, dysphasia, odynophagia, melena, hematochezia, dysuria, hematuria, rash, seizure activity, orthopnea, PND, pedal edema, claudication. Remaining systems are negative.  Physical Exam: Well-developed well-nourished in no acute distress.  Skin is warm and dry.  HEENT is normal.  Neck is supple.  Chest is clear to auscultation with normal expansion.  Cardiovascular exam is regular rate and rhythm.  Abdominal exam nontender or distended. No masses palpated. Extremities show no edema. neuro grossly intact  ECG-normal sinus rhythm at a rate of 65, right  bundle branch block, no ST changes.  Personally reviewed  A/P  1 chronic diastolic congestive heart failure-patient appears to be euvolemic on examination.  We will continue Lasix as needed.  2 coronary artery disease-Continue medical therapy with aspirin and statin.  3 hypertension-patient's blood pressure is controlled.  Continue present medications and follow-up.  4 hyperlipidemia-continue statin.  5 history of chronic chest pain-no recurrent symptoms.  Electrocardiogram shows no ST changes.  [name], MD

## 2021-10-11 ENCOUNTER — Other Ambulatory Visit: Payer: Self-pay

## 2021-10-11 ENCOUNTER — Ambulatory Visit (INDEPENDENT_AMBULATORY_CARE_PROVIDER_SITE_OTHER): Payer: Medicare Other | Admitting: Family Medicine

## 2021-10-11 ENCOUNTER — Encounter: Payer: Self-pay | Admitting: Family Medicine

## 2021-10-11 VITALS — BP 180/91 | HR 63 | Temp 97.7°F | Ht 64.0 in | Wt 145.4 lb

## 2021-10-11 DIAGNOSIS — Z23 Encounter for immunization: Secondary | ICD-10-CM | POA: Diagnosis not present

## 2021-10-11 DIAGNOSIS — R5381 Other malaise: Secondary | ICD-10-CM

## 2021-10-11 DIAGNOSIS — E7800 Pure hypercholesterolemia, unspecified: Secondary | ICD-10-CM | POA: Diagnosis not present

## 2021-10-11 DIAGNOSIS — I10 Essential (primary) hypertension: Secondary | ICD-10-CM | POA: Diagnosis not present

## 2021-10-11 DIAGNOSIS — F3342 Major depressive disorder, recurrent, in full remission: Secondary | ICD-10-CM

## 2021-10-11 DIAGNOSIS — F411 Generalized anxiety disorder: Secondary | ICD-10-CM | POA: Diagnosis not present

## 2021-10-11 DIAGNOSIS — Z0000 Encounter for general adult medical examination without abnormal findings: Secondary | ICD-10-CM

## 2021-10-11 DIAGNOSIS — Z79899 Other long term (current) drug therapy: Secondary | ICD-10-CM

## 2021-10-11 DIAGNOSIS — I2510 Atherosclerotic heart disease of native coronary artery without angina pectoris: Secondary | ICD-10-CM

## 2021-10-11 LAB — BASIC METABOLIC PANEL
BUN: 14 mg/dL (ref 6–23)
CO2: 30 mEq/L (ref 19–32)
Calcium: 9 mg/dL (ref 8.4–10.5)
Chloride: 102 mEq/L (ref 96–112)
Creatinine, Ser: 0.78 mg/dL (ref 0.40–1.20)
GFR: 74.22 mL/min (ref >60.00)
Glucose, Bld: 86 mg/dL (ref 70–99)
Potassium: 4.1 mEq/L (ref 3.5–5.1)
Sodium: 138 mEq/L (ref 135–145)

## 2021-10-11 LAB — LIPID PANEL
Cholesterol: 129 mg/dL (ref 0–200)
HDL: 44.8 mg/dL (ref >39.00)
LDL Cholesterol: 61 mg/dL (ref 0–99)
NonHDL: 84.36
Total CHOL/HDL Ratio: 3
Triglycerides: 117 mg/dL (ref 0.0–149.0)
VLDL: 23.4 mg/dL (ref 0.0–40.0)

## 2021-10-11 MED ORDER — ISOSORBIDE MONONITRATE ER 30 MG PO TB24: ORAL_TABLET | ORAL | 0 refills | Status: DC

## 2021-10-11 MED ORDER — ALPRAZOLAM 1 MG PO TABS: ORAL_TABLET | ORAL | 5 refills | Status: DC

## 2021-10-11 NOTE — Progress Notes (Signed)
OFFICE VISIT  10/11/2021  CC:  Chief Complaint  Patient presents with   Follow-up    RCI   HPI:    Patient is a 75 y.o. female who presents for 3 mo f/u recurrent UTI, recurrent MDD, GAD (high risk med use), debilitation (+recurrent falls), HTN, and HLD. A/P as of last visit: "1) Cystitis with hematuria, in the setting of chronic urge incontinence and hx of recurrent UTI. Start bactrim DS 1 bid x 5d, sent urine for c/s. Regarding her urge incontinence, the only med that has helped some AND is affordable for her is vesicare-->continue 10mg  qd.  She declined urol referral today (? Post tibial nerve stim, ?operative options avail?).   2) Recurrent MDD, GAD: stable on current regimen of lamictal 150 qd, cymbalta 90 qd, and xanax 1mg  tid.  CSC UTD.  No new rx for alprazolam needed today.   3) Recurrent falls/debilitated pt: d/t chronic and debilitating bilat knee osteoarthritis. Ongoing care via meds + injections with Dr. Letta Pate. She states she has gotten max benefit from PT and declines any further.   4) HTN: stable on toprol xl 100 qd, imdur 30 qd. Lytes/cr today.   5) Preventative health care: Due for screening mammogram (last was 2018) and f/u DEXA (last was 2018) but she wants to defer these for now.   6) Chronic hypoxic resp failure: copd +pulm fibrosis/asbestosis. Stable, cont symbicort, oxygen prn daytime + nocturnal, albut prn.   7) HLD, nonosbt CAD: LDL goal 70.  Tolerating rosuva 20mg  qd. LDL was 74 six months ago. Pt not fasting today. Plan repeat 3-6 mo."  INTERIM HX: Doing pretty well. BP up today.  Feels sense of anxiety/slight Ha coming on. Home bp monitoring: none.  Has been out of imdur for 2 wks or so. Not requiring lasix any.  Taking toprol xl 100 qd.  Mood/anxiety level stable overall. Taking xanax 1 tid. Also taking trazodone 150mg  dose hs.  Still having trouble initiating sleep, also wakes up several times a night.  Breathing has felt good  lately.  ROS as above, plus--> no fevers, no CP, no SOB, no wheezing, no cough, no dizziness, no HAs, no rashes, no melena/hematochezia.  Chronic urinary urgency and frequency.  No dysuria or unusual/new urinary urgency or frequency.  Chronic bilat knee pain.  No focal weakness, paresthesias, or tremors.  No acute vision or hearing abnormalities.  No recent changes in lower legs. No n/v/d or abd pain.  No palpitations.    PMP AWARE reviewed today: most recent rx for alprazolam was filled 09/18/21, # 17, rx by me.   No red flags.   Past Medical History:  Diagnosis Date   Acute upper GI bleed 06/2014   while pt taking coumadin, plavix, and meloxicam---despite being told not to take coumadin.   Anginal pain ([place])    Nonobstructive CAD 2014; however, her cardiologist put her on a statin for this and NOT for hyperlipidemia per pt report.  Atyp CP 08/2017 at card f/u, plan for myoc perf imaging.   Anxiety    panic attacks   Asthma    w/ asbestososis    BPPV (benign paroxysmal positional vertigo) 12/16/2012   Chronic diastolic CHF (congestive heart failure) (HCC)    dry wt as of 11/06/16 is 168 lbs.   Chronic lower back pain    COPD (chronic obstructive pulmonary disease) (HCC)    DDD (degenerative disc disease)    lumbar and cervical.    Diverticular disease  Fibromyalgia    Patient states dx was around her late 33s but she had sx's for years prior to this.   H/O hiatal hernia    History of pneumonia    hospitalized 12/2011, 02/2013, and 07/2013 [facility]) for this   HTN (hypertension)    Renal artery dopplers 04/2013 neg for stenosis.   Hypervitaminosis D 09/27/2019   over-supplemented.  Stopped vit D and plan recheck 2 mo.   Idiopathic angio-edema-urticaria 72014; 2021   Angioedema component was very minimal.  2021->Dr. Bobbitt (allergist) eval.   Insomnia    Iron deficiency anemia    Hematologist in [place], [place] did extensive w/u; no cause found; failed oral supplement;; gets fairly  regular (q93m or so) IV iron infusions (Venofer -iron sucrose- 200mg  with procrit.  "for 14 yr I've been getting blood work q month & getting infusions prn" (07/12/2013).  Dr. Marin Olp locally, iron infusions done, EPO deficiency dx'd   Migraine syndrome    "not as often anymore; used to be ~ q wk" (07/12/2013)   Mixed incontinence urge and stress    Nephrolithiasis    "passed all on my own or they are still in there" (07/12/2013)   Neuroleptic induced parkinsonism (HCC) 2018   Dr. Carles Collet, neuro, saw her 11/24/17 and recommended d/c of abilify as first step.  D/c'd abilify and pt got complete recovery.   Oropharyngeal dysphagia    swallowing study speech path 05/2020. Gastric bx's showed gastritis, h pylori NEG   OSA on CPAP    prior to move to [place]--had another sleep study 10/2015 w/pulm Dr. Camillo Flaming.   Osteoarthritis    "severe; progressing fast" (07/12/2013); multiple joints-not surgical candidate for TKR (03/2015).  Triamcinolon knee injections by Dr. Tessa Lerner 12/2017.   Pernicious anemia 08/24/2014   Pleural plaque with presence of asbestos 07/22/2013   Pulmonary embolism ([place]) 07/2013   Dx at [facility] with very small peripheral upper lobe pe 07/2013: pt took coumadin for about 8-9 mo   Pyelonephritis    "several times over the last 30 yr" (07/12/2013)   RBBB (right bundle branch block)    Recurrent major depression ([place])    Recurrent UTI    hx of hospitalization for pyelonephritis; started abx prophylaxis 06/2015   Syncope    Hypotensive; ED visit--Dr. Terrence Dupont did Cath--nonobstructive CAD, EF 55-60%.  In retrospect, suspect pt rx med misuse/polypharmacy    Past Surgical History:  Procedure Laterality Date   APPENDECTOMY  1960   AXILLARY SURGERY Left 1978   Multiple "lump" in armpit per pt   BIOPSY  06/17/2020   Gastric bx->gastritis, h pylori neg.  Procedure: BIOPSY;  Surgeon: Carol Ada, MD;  Location: [facility];  Service: Endoscopy;;   CARDIAC CATHETERIZATION  01/2013   nonobstructive CAD, EF 55-60%    CARDIOVASCULAR STRESS TEST  02/22/15   Low risk myocard perf imaging; wall motion normal, normal EF   carotid duplex doppler  10/21/2017   R vertebral flow suggestive of possible distal obstruction.  Pt declined further w/u as of 10/29/17 but need to revisit this problem periodically.   COCCYX REMOVAL  1972   DEXA  06/05/2017   T-score -3.1   DILATION AND CURETTAGE OF UTERUS  ? 1970's   ESOPHAGOGASTRODUODENOSCOPY N/A 07/19/2014   Gastritis found + in the setting of supratherapeutic INR, +plavix, + meloxicam.   ESOPHAGOGASTRODUODENOSCOPY (EGD) WITH PROPOFOL N/A 06/17/2020   NO stricture or other prob to explain pt's dysphagia, dilation was done anyway.  Gastric bx's-->gastritis, h pylori neg. Procedure:  ESOPHAGOGASTRODUODENOSCOPY (EGD) WITH PROPOFOL;  Surgeon: Carol Ada, MD;  Location: [facility];  Service: Endoscopy;  Laterality: N/A;   EYE SURGERY Left 2012-2013   "injections for ~ 1 yr; don't really know what for" (07/12/2013)   HEEL SPUR SURGERY Left 2008   KNEE SURGERY  2005   LEFT HEART CATHETERIZATION WITH CORONARY ANGIOGRAM N/A 01/30/2013   Procedure: LEFT HEART CATHETERIZATION WITH CORONARY ANGIOGRAM;  Surgeon: Clent Demark, MD;  Location: [facility];  Service: Cardiovascular;  Laterality: N/A;   MALONEY DILATION  06/17/2020   Procedure: Venia Minks DILATION;  Surgeon: Carol Ada, MD;  Location: [facility];  Service: Endoscopy;;   PLANTAR FASCIA RELEASE Left 2008   SPIROMETRY  04/25/14   In hosp for acute asthma/COPD flare: mixed obstructive and restrictive lung disease. The FEV1 is severely reduced at 45% predicted.  FEV1 signif decreased compared to prior spirometry 07/23/13.   TENDON RELEASE  1996   Right forearm and hand   TOTAL ABDOMINAL HYSTERECTOMY  1974   TRANSTHORACIC ECHOCARDIOGRAM  01/2013; 04/2014;08/2015; 09/2017   2014--NORMAL.  2015--focal basal septal hypertrophy, EF 55-60%, grade I diast dysfxn, mild LAE.  08/2015 EF 55-60%, nl LV syst fxn, grade I DD, valves wnl.  10/21/17: EF 65-70%, grd I DD, o/w normal.    Outpatient Medications Prior to Visit  Medication Sig Dispense Refill   albuterol (VENTOLIN HFA) 108 (90 Base) MCG/ACT inhaler Inhale 1-2 puffs into the lungs every 6 (six) hours as needed for wheezing or shortness of breath. 18 g 1   ALPRAZolam (XANAX) 1 MG tablet TAKE 1 TABLET THREE TIMES DAILY AS NEEDED FOR ANXIETY. 90 tablet 5   aspirin 81 MG tablet Take 81 mg by mouth at bedtime.     budesonide-formoterol (SYMBICORT) 80-4.5 MCG/ACT inhaler 2 puffs     cyclobenzaprine (FLEXERIL) 5 MG tablet Take 1 tablet (5 mg total) by mouth 2 (two) times daily as needed for muscle spasms. 60 tablet 3   diclofenac Sodium (VOLTAREN) 1 % GEL Apply 2 g topically 4 (four) times daily. 4 times a day to both knees 300 g 4   donepezil (ARICEPT) 10 MG tablet Take 1 tablet (10 mg total) by mouth at bedtime. must have office visit 90 tablet 3   DULoxetine (CYMBALTA) 30 MG capsule TAKE 1 CAPSULE ONCE DAILY ALONG WITH 60MG  CAPSULE FOR TOTAL 90MG  DAILY. 90 capsule 1   DULoxetine (CYMBALTA) 60 MG capsule TAKE 1 CAPSULE A DAY TO BE COMBINED WITH 30MG  CAPSULE 90 capsule 1   fluconazole (DIFLUCAN) 100 MG tablet TAKE (1) TABLET DAILY AS NEEDED FOR THRUSH 15 tablet 3   fluticasone (FLONASE) 50 MCG/ACT nasal spray USE 2 SPRAYS IN EACH NOSTRIL ONCE DAILY 16 g 5   furosemide (LASIX) 40 MG tablet Take 1 tablet (40 mg total) by mouth daily as needed for fluid.     ibuprofen (ADVIL) 600 MG tablet Take 1 tablet (600 mg total) by mouth every 6 (six) hours as needed. 30 tablet 0   ipratropium (ATROVENT) 0.03 % nasal spray Place 2 sprays into both nostrils every 12 (twelve) hours. 30 mL 11   isosorbide mononitrate (IMDUR) 30 MG 24 hr tablet TAKE 2 TABLETS IN THE AM AND 1 TABLET IN THE PM. 30 tablet 0   lamoTRIgine (LAMICTAL) 150 MG tablet TAKE 1 TABLET EACH DAY. 90 tablet 3   metoprolol succinate (TOPROL-XL) 100 MG 24 hr tablet 1 tab po qd, take additional 1/2 tab as needed for bp > 160/90  135 tablet  1   metroNIDAZOLE (METROGEL) 0.75 % gel Apply 1 application topically at bedtime.     Multiple Vitamin (MULTIVITAMIN WITH MINERALS) TABS tablet Take 1 tablet by mouth daily.     ondansetron (ZOFRAN) 8 MG tablet Take 1 tablet (8 mg total) by mouth every 8 (eight) hours as needed for nausea or vomiting. 30 tablet 6   oxyCODONE-acetaminophen (PERCOCET) 10-325 MG tablet Take 1 tablet by mouth every 6 (six) hours as needed for pain. Do Not Fill Before 09/30/2021 100 tablet 0   pantoprazole (PROTONIX) 40 MG tablet TAKE 1 TABLET BY MOUTH TWICE DAILY. 180 tablet 0   rosuvastatin (CRESTOR) 20 MG tablet TAKE 1 TABLET ONCE DAILY. 90 tablet 3   solifenacin (VESICARE) 10 MG tablet Take 1 tablet (10 mg total) by mouth daily. 90 tablet 3   traZODone (DESYREL) 50 MG tablet TAKE 2-4 TABLETS AT BEDTIME 120 tablet 11   alendronate (FOSAMAX) 70 MG tablet Take 1 tablet (70 mg total) by mouth every 7 (seven) days. Take with a full glass of water on an empty stomach, first thing in the morning and remain up right for 30 minutes after taking. (Patient not taking: Reported on 10/11/2021) 12 tablet 3   docusate sodium (COLACE) 100 MG capsule Take 1 capsule (100 mg total) by mouth 2 (two) times daily. (Patient not taking: Reported on 10/11/2021) 10 capsule 0   EPINEPHrine 0.3 mg/0.3 mL IJ SOAJ injection Inject 0.3 mg into the muscle as needed for anaphylaxis. (Patient not taking: Reported on 10/11/2021) 1 each 1   lidocaine (LIDODERM) 5 % Place 1 patch onto the skin daily. Remove & Discard patch within 12 hours or as directed by MD (Patient not taking: Reported on 10/11/2021) 8 patch 0   mupirocin ointment (BACTROBAN) 2 % Apply 1 application topically 2 (two) times daily. Can be ointment or cream- whichever on formulary and cheapest for her (Patient not taking: Reported on 10/11/2021) 22 g 0   nitroGLYCERIN (NITROSTAT) 0.4 MG SL tablet Place 1 tablet (0.4 mg total) under the tongue every 5 (five) minutes as needed for  chest pain (x 3 doses). (Patient not taking: Reported on 10/11/2021) 25 tablet 11   No facility-administered medications prior to visit.    Allergies  Allergen Reactions   Abilify [Aripiprazole] Other (See Comments)    parkinsonism   Bactrim [Sulfamethoxazole-Trimethoprim] Rash   Penicillins Itching, Swelling and Rash    Tolerated Cefepime in ED. Has patient had a PCN reaction causing immediate rash, facial/tongue/throat swelling, SOB or lightheadedness with hypotension: Yes Has patient had a PCN reaction causing severe rash involving mucus membranes or skin necrosis: No Has patient had a PCN reaction that required hospitalization: No  Has patient had a PCN reaction occurring within the last 10 years: No    ROS As per HPI  PE: Vitals with BMI 10/11/2021 08/15/2021 08/02/2021  Height 5\' 4"  (No Data) 5\' 4"   Weight 145 lbs 6 oz (No Data) 142 lbs  BMI 16.10 - 96.04  Systolic 540 981 191  Diastolic 91 80 60  Pulse 63 59 64  O2 sat 94% on RA today.  Gen: Alert, well appearing.  Patient is oriented to person, place, time, and situation. AFFECT: pleasant, lucid thought and speech. CV: RRR, no m/r/g.   LUNGS: CTA bilat, nonlabored resps, good aeration in all lung fields. EXT: no clubbing or cyanosis.  no edema.    LABS:  Lab Results  Component Value Date   TSH 2.750 10/04/2019  Lab Results  Component Value Date   WBC 7.2 11/25/2020   HGB 13.3 11/25/2020   HCT 41.5 11/25/2020   MCV 89.4 11/25/2020   PLT 184 11/25/2020   Lab Results  Component Value Date   CREATININE 0.81 07/16/2021   BUN 18 07/16/2021   NA 142 07/16/2021   K 4.0 07/16/2021   CL 104 07/16/2021   CO2 28 07/16/2021   Lab Results  Component Value Date   ALT 17 07/16/2021   AST 21 07/16/2021   ALKPHOS 65 07/16/2021   BILITOT 0.4 07/16/2021   Lab Results  Component Value Date   CHOL 145 12/28/2020   Lab Results  Component Value Date   HDL 58.20 12/28/2020   Lab Results  Component Value Date    LDLCALC 74 12/28/2020   Lab Results  Component Value Date   TRIG 65.0 12/28/2020   Lab Results  Component Value Date   CHOLHDL 2 12/28/2020   Lab Results  Component Value Date   HGBA1C 5.6 01/02/2016   IMPRESSION AND PLAN:  1) HTN, uncontrolled.  Has been out of imdur.  RF'd this today: 60mg  qAM and 30mg  qpm.  Cont toprol xl 100 qd.  Take extra 50mg  dose if SBP >160. Encouraged her to keep f/u appt set for 10/25 with Dr. Stanford Breed. Lytes/cr today.  2) HLD: nonosbt CAD: LDL goal 70.  Tolerating rosuva 20mg  qd. LDL was 74 six months ago. She last at about 6 hours ago.  Will get lipid panel today.  3) Recurrent, treatment resistant MDD.  GAD. Stable on lamictal 150 qd and duloxetine 90mg  qd.  Cont this as well as xanax 1mg  tid. Alpraz RF today.  CSC UTD.  UDS utd through her pain/interventional specialist.  4) Recurrent UTI, hx of OAB unresponsive to meds. She has appt with Dr. Gloriann Loan in urology next month to get further evaluation.  5) Preventative health care: Due for screening mammogram (last was 2018) and f/u DEXA (last was 2018) but she wants to defer these for now. Flu vaccine today.  6) Recurrent falls/debilitated pt: d/t chronic and debilitating bilat knee osteoarthritis. Ongoing care via meds + injections with Dr. Letta Pate. She states she has gotten max benefit from PT and declines any further.  7) Chronic hypoxic resp failure: copd +pulm fibrosis/asbestosis. Oxygenating well on RA today. Stable, cont symbicort, oxygen prn daytime + nocturnal, albut prn.  An After Visit Summary was printed and given to the patient.  FOLLOW UP: No follow-ups on file.  Signed:  Crissie Sickles, MD           10/11/2021

## 2021-10-16 ENCOUNTER — Ambulatory Visit (INDEPENDENT_AMBULATORY_CARE_PROVIDER_SITE_OTHER): Payer: Medicare Other | Admitting: Cardiology

## 2021-10-16 ENCOUNTER — Other Ambulatory Visit: Payer: Self-pay

## 2021-10-16 ENCOUNTER — Other Ambulatory Visit: Payer: Self-pay | Admitting: Family Medicine

## 2021-10-16 ENCOUNTER — Encounter: Payer: Self-pay | Admitting: Cardiology

## 2021-10-16 VITALS — BP 135/75 | HR 65 | Ht 64.0 in | Wt 137.4 lb

## 2021-10-16 DIAGNOSIS — I5032 Chronic diastolic (congestive) heart failure: Secondary | ICD-10-CM | POA: Diagnosis not present

## 2021-10-16 DIAGNOSIS — E7800 Pure hypercholesterolemia, unspecified: Secondary | ICD-10-CM

## 2021-10-16 DIAGNOSIS — I2510 Atherosclerotic heart disease of native coronary artery without angina pectoris: Secondary | ICD-10-CM | POA: Diagnosis not present

## 2021-10-16 DIAGNOSIS — I10 Essential (primary) hypertension: Secondary | ICD-10-CM

## 2021-10-16 MED ORDER — NITROGLYCERIN 0.4 MG SL SUBL: 0.4000 mg | SUBLINGUAL_TABLET | SUBLINGUAL | 11 refills | Status: DC | PRN

## 2021-10-16 MED ORDER — FUROSEMIDE 40 MG PO TABS: 40.0000 mg | ORAL_TABLET | Freq: Every day | ORAL | 6 refills | Status: DC | PRN

## 2021-10-16 MED ORDER — ISOSORBIDE MONONITRATE ER 30 MG PO TB24: ORAL_TABLET | ORAL | 3 refills | Status: DC

## 2021-10-16 MED ORDER — ROSUVASTATIN CALCIUM 20 MG PO TABS: 20.0000 mg | ORAL_TABLET | Freq: Every day | ORAL | 3 refills | Status: DC

## 2021-10-16 NOTE — Patient Instructions (Signed)
  Follow-Up: At [facility], you and your health needs are our priority.  As part of our continuing mission to provide you with exceptional heart care, we have created designated Provider Care Teams.  These Care Teams include your primary Cardiologist (physician) and Advanced Practice Providers (APPs -  Physician Assistants and Nurse Practitioners) who all work together to provide you with the care you need, when you need it.  We recommend signing up for the patient portal called "MyChart".  Sign up information is provided on this After Visit Summary.  MyChart is used to connect with patients for Virtual Visits (Telemedicine).  Patients are able to view lab/test results, encounter notes, upcoming appointments, etc.  Non-urgent messages can be sent to your provider as well.   To learn more about what you can do with MyChart, go to [email or web address].    Your next appointment:   12 month(s)  The format for your next appointment:   In Person  Provider:   Brian Crenshaw, MD    

## 2021-10-18 ENCOUNTER — Other Ambulatory Visit: Payer: Self-pay

## 2021-10-18 ENCOUNTER — Encounter: Payer: Self-pay | Admitting: Physical Medicine & Rehabilitation

## 2021-10-18 ENCOUNTER — Encounter: Payer: Medicare Other | Attending: Physical Medicine & Rehabilitation | Admitting: Physical Medicine & Rehabilitation

## 2021-10-18 VITALS — BP 145/78 | HR 69 | Ht 64.0 in | Wt 145.4 lb

## 2021-10-18 DIAGNOSIS — M25562 Pain in left knee: Secondary | ICD-10-CM | POA: Diagnosis not present

## 2021-10-18 DIAGNOSIS — M1711 Unilateral primary osteoarthritis, right knee: Secondary | ICD-10-CM | POA: Diagnosis not present

## 2021-10-18 DIAGNOSIS — M1712 Unilateral primary osteoarthritis, left knee: Secondary | ICD-10-CM | POA: Diagnosis not present

## 2021-10-18 DIAGNOSIS — G8929 Other chronic pain: Secondary | ICD-10-CM | POA: Diagnosis not present

## 2021-10-18 MED ORDER — OXYCODONE-ACETAMINOPHEN 10-325 MG PO TABS: 1.0000 | ORAL_TABLET | Freq: Four times a day (QID) | ORAL | 0 refills | Status: DC | PRN

## 2021-10-18 NOTE — Progress Notes (Signed)
  PROCEDURE RECORD [facility]   Name: SHERRIKA WEAKLAND DOB:[birth year] MRN: [id number]  Date:10/18/2021  Physician: Alysia Penna, MD    Nurse/CMA: Jorja Loa MA  Allergies:  Allergies  Allergen Reactions   Abilify [Aripiprazole] Other (See Comments)    parkinsonism   Bactrim [Sulfamethoxazole-Trimethoprim] Rash   Penicillins Itching, Swelling and Rash    Tolerated Cefepime in ED. Has patient had a PCN reaction causing immediate rash, facial/tongue/throat swelling, SOB or lightheadedness with hypotension: Yes Has patient had a PCN reaction causing severe rash involving mucus membranes or skin necrosis: No Has patient had a PCN reaction that required hospitalization: No  Has patient had a PCN reaction occurring within the last 10 years: No     Consent Signed: Yes.    Is patient diabetic? No.  CBG today?   Pregnant: No. LMP: No LMP recorded. Patient has had a hysterectomy. (age 2-55)  Anticoagulants: no Anti-inflammatory: no Antibiotics: no  Procedure: left genicular nerve block  Position: Supine Start Time: 10:28 am  End Time: 10:39 am  Fluoro Time: 34  RN/CMA Shumaker RN Lisaanne Lawrie MA    Time 10:09 AM 10:45 am    BP 145/78 124/82    Pulse 69 14    Respirations 14 14    O2 Sat 91 92    S/S 6 6    Pain Level 9/10 5/10     D/C home with Mr Florene Glen, patient A & O X 3, D/C instructions reviewed, and sits independently.

## 2021-10-18 NOTE — Patient Instructions (Signed)
Genicular nerve blocks were performed today. This is to block pain signals from the knee. A local anesthetic was used to block the knee therefore this will not be a permanent procedure. Please keep track of your pain today and compared to the pain that you had prior to the injection. At next visit we will discuss the results. If it is helpful but only short-term we would need to confirm this with an additional injection prior to proceeding with a radiofrequency neurotomy

## 2021-10-18 NOTE — Progress Notes (Signed)
Left Genicular nerve block x 3, Upper medial, Upper lateral , and Lower Medial under fluoroscopic guidance  Indication Chronic pain in the Knee, pain which has not responded to conservative management such as physical therapy and medication management  Informed consent was obtained after describing risks and to the procedure to the patient these include bleeding bruising and infection, patient elects to proceed and has given written consent. Patient placed supine on the fluoroscopy table AP images of the knee joint were obtained. A 25-gauge 1.5 inch needle was used to anesthetize the skin and subcutaneous tissue with 1% lidocaine, 1 cc at each of 3 locations. Then a 22-gauge 3.5" spinal needle was inserted targeting the junction of the medial flare of the tibia with the shaft of the tibia, bone contact made and confirmed with lateral imaging. Then Omnipaque 180 x0.5 mL demonstrated no intravascular uptake followed by injection of 1.81ml .25% bupivacaine. Then the junction of the medial epicondyles of the femur with the femoral shaft was targeted needle was advanced under fluoroscopic guidance until bone contact. Appropriate depth was obtained and confirmed with lateral images. Then Omnipaque 180 x0.5 mL demonstrated no intravascular uptake followed by injection of 1.94ml of .25% bupivacaine. Then the junction of the lateral femoral condyle with the femoral shaft was targeted. 22-gauge 3.5 inch needle was advanced under fluoroscopic guidance until bone contact. Appropriate depth was confirmed with lateral imaging. 0.5 mL of Omnipaque 180 injected followed by injection of 1.5 cc of .25% bupivacaine solution. Patient tolerated procedure well. Post procedure instructions given

## 2021-11-01 DIAGNOSIS — N3946 Mixed incontinence: Secondary | ICD-10-CM | POA: Diagnosis not present

## 2021-11-01 DIAGNOSIS — N952 Postmenopausal atrophic vaginitis: Secondary | ICD-10-CM | POA: Diagnosis not present

## 2021-11-01 DIAGNOSIS — Z87442 Personal history of urinary calculi: Secondary | ICD-10-CM | POA: Diagnosis not present

## 2021-11-01 DIAGNOSIS — N390 Urinary tract infection, site not specified: Secondary | ICD-10-CM | POA: Diagnosis not present

## 2021-11-01 DIAGNOSIS — R310 Gross hematuria: Secondary | ICD-10-CM | POA: Diagnosis not present

## 2021-11-06 DIAGNOSIS — Z20822 Contact with and (suspected) exposure to covid-19: Secondary | ICD-10-CM | POA: Diagnosis not present

## 2021-11-12 ENCOUNTER — Other Ambulatory Visit: Payer: Self-pay | Admitting: Family Medicine

## 2021-11-13 DIAGNOSIS — R310 Gross hematuria: Secondary | ICD-10-CM | POA: Diagnosis not present

## 2021-11-16 ENCOUNTER — Other Ambulatory Visit: Payer: Self-pay | Admitting: Physical Medicine & Rehabilitation

## 2021-11-16 DIAGNOSIS — M1712 Unilateral primary osteoarthritis, left knee: Secondary | ICD-10-CM

## 2021-11-16 DIAGNOSIS — M1711 Unilateral primary osteoarthritis, right knee: Secondary | ICD-10-CM

## 2021-11-19 ENCOUNTER — Other Ambulatory Visit: Payer: Self-pay | Admitting: Physical Medicine & Rehabilitation

## 2021-11-19 DIAGNOSIS — M1711 Unilateral primary osteoarthritis, right knee: Secondary | ICD-10-CM

## 2021-11-19 DIAGNOSIS — M1712 Unilateral primary osteoarthritis, left knee: Secondary | ICD-10-CM

## 2021-11-19 NOTE — Addendum Note (Signed)
Addended by: Caro Hight on: 11/19/2021 01:25 PM   Modules accepted: Orders

## 2021-11-20 ENCOUNTER — Telehealth: Payer: Self-pay

## 2021-11-20 DIAGNOSIS — N202 Calculus of kidney with calculus of ureter: Secondary | ICD-10-CM | POA: Diagnosis not present

## 2021-11-20 DIAGNOSIS — N3289 Other specified disorders of bladder: Secondary | ICD-10-CM | POA: Diagnosis not present

## 2021-11-20 DIAGNOSIS — K838 Other specified diseases of biliary tract: Secondary | ICD-10-CM | POA: Diagnosis not present

## 2021-11-20 NOTE — Telephone Encounter (Signed)
Pt called asking for a fill on her pain medication . Looks like Dr.Swartz filled it today . I called the pharmacy and confirmed with the pharmacy that they have received the medication and they have it ready for the patient . The patient would also like to cancel an appointment with Dr.Kirsteins I left a v/m asking pt to call us back to know why she would like to cancel.

## 2021-11-20 NOTE — Telephone Encounter (Signed)
Linda [name] said the nerve block did not work so she does not want another. Appt cancelled.

## 2021-11-22 DIAGNOSIS — R319 Hematuria, unspecified: Secondary | ICD-10-CM | POA: Diagnosis not present

## 2021-11-22 DIAGNOSIS — N390 Urinary tract infection, site not specified: Secondary | ICD-10-CM | POA: Diagnosis not present

## 2021-11-22 DIAGNOSIS — R350 Frequency of micturition: Secondary | ICD-10-CM | POA: Diagnosis not present

## 2021-11-22 DIAGNOSIS — R82998 Other abnormal findings in urine: Secondary | ICD-10-CM | POA: Diagnosis not present

## 2021-11-22 DIAGNOSIS — R109 Unspecified abdominal pain: Secondary | ICD-10-CM | POA: Diagnosis not present

## 2021-11-22 DIAGNOSIS — R3915 Urgency of urination: Secondary | ICD-10-CM | POA: Diagnosis not present

## 2021-11-23 DIAGNOSIS — Z87440 Personal history of urinary (tract) infections: Secondary | ICD-10-CM | POA: Diagnosis not present

## 2021-11-23 DIAGNOSIS — N201 Calculus of ureter: Secondary | ICD-10-CM | POA: Diagnosis not present

## 2021-11-28 ENCOUNTER — Encounter: Payer: Self-pay | Admitting: Physical Medicine & Rehabilitation

## 2021-11-28 ENCOUNTER — Other Ambulatory Visit: Payer: Self-pay

## 2021-11-28 ENCOUNTER — Encounter: Payer: Medicare Other | Attending: Physical Medicine & Rehabilitation | Admitting: Physical Medicine & Rehabilitation

## 2021-11-28 DIAGNOSIS — E876 Hypokalemia: Secondary | ICD-10-CM | POA: Diagnosis not present

## 2021-11-28 DIAGNOSIS — M1712 Unilateral primary osteoarthritis, left knee: Secondary | ICD-10-CM | POA: Diagnosis not present

## 2021-11-28 DIAGNOSIS — M170 Bilateral primary osteoarthritis of knee: Secondary | ICD-10-CM | POA: Diagnosis not present

## 2021-11-28 DIAGNOSIS — M1711 Unilateral primary osteoarthritis, right knee: Secondary | ICD-10-CM | POA: Insufficient documentation

## 2021-11-28 MED ORDER — OXYCODONE-ACETAMINOPHEN 10-325 MG PO TABS: 1.0000 | ORAL_TABLET | Freq: Four times a day (QID) | ORAL | 0 refills | Status: DC | PRN

## 2021-11-28 NOTE — Patient Instructions (Signed)
PLEASE FEEL FREE TO CALL OUR OFFICE WITH ANY PROBLEMS OR QUESTIONS ([phone])      

## 2021-11-28 NOTE — Progress Notes (Signed)
PROCEDURE NOTE  DIAGNOSIS:  OA bilateral knees  INTERVENTION:   ZILRETTA INJECTIONS (2)     After informed consent and preparation of the skin with betadine and isopropyl alcohol, I injected 32MG  of zilretta dissolved into 5cc of diluent into both knees via anterolateral approach. Contents of syring were shaken vigorously and aspiration was performed prior to injection. The patient tolerated well, and no complications were encountered. Afterward the area was cleaned and dressed. Post- injection instructions were provided including ice  if swelling or pain should occur.    Meredith Staggers, MD, [facility] 11/28/2021     I increased percocet to #120 given intractable pain and lack of benefit with genicular nerve blocks

## 2021-11-29 ENCOUNTER — Telehealth: Payer: Self-pay | Admitting: *Deleted

## 2021-11-29 NOTE — Telephone Encounter (Signed)
Peck called to let [name] know that [place] filled her perocet on 11/20/21 and she does not get to fill medication earlier because he increased the disp # to 120 but did not change the sig. It will not be due to refill before 12/13/21 at the earliest unless he changes the directions for use.

## 2021-11-30 DIAGNOSIS — N201 Calculus of ureter: Secondary | ICD-10-CM | POA: Diagnosis not present

## 2021-11-30 DIAGNOSIS — I340 Nonrheumatic mitral (valve) insufficiency: Secondary | ICD-10-CM | POA: Diagnosis not present

## 2021-11-30 DIAGNOSIS — I110 Hypertensive heart disease with heart failure: Secondary | ICD-10-CM | POA: Diagnosis not present

## 2021-11-30 DIAGNOSIS — J45909 Unspecified asthma, uncomplicated: Secondary | ICD-10-CM | POA: Diagnosis not present

## 2021-11-30 DIAGNOSIS — Z7982 Long term (current) use of aspirin: Secondary | ICD-10-CM | POA: Diagnosis not present

## 2021-11-30 DIAGNOSIS — M1711 Unilateral primary osteoarthritis, right knee: Secondary | ICD-10-CM | POA: Diagnosis not present

## 2021-11-30 DIAGNOSIS — I4510 Unspecified right bundle-branch block: Secondary | ICD-10-CM | POA: Diagnosis not present

## 2021-11-30 DIAGNOSIS — Z882 Allergy status to sulfonamides status: Secondary | ICD-10-CM | POA: Diagnosis not present

## 2021-11-30 DIAGNOSIS — Z79899 Other long term (current) drug therapy: Secondary | ICD-10-CM | POA: Diagnosis not present

## 2021-11-30 DIAGNOSIS — I69398 Other sequelae of cerebral infarction: Secondary | ICD-10-CM | POA: Diagnosis not present

## 2021-11-30 DIAGNOSIS — Z7951 Long term (current) use of inhaled steroids: Secondary | ICD-10-CM | POA: Diagnosis not present

## 2021-11-30 DIAGNOSIS — F419 Anxiety disorder, unspecified: Secondary | ICD-10-CM | POA: Diagnosis not present

## 2021-11-30 DIAGNOSIS — Z8673 Personal history of transient ischemic attack (TIA), and cerebral infarction without residual deficits: Secondary | ICD-10-CM | POA: Diagnosis not present

## 2021-11-30 DIAGNOSIS — Z888 Allergy status to other drugs, medicaments and biological substances status: Secondary | ICD-10-CM | POA: Diagnosis not present

## 2021-11-30 DIAGNOSIS — Z466 Encounter for fitting and adjustment of urinary device: Secondary | ICD-10-CM | POA: Diagnosis not present

## 2021-11-30 DIAGNOSIS — I509 Heart failure, unspecified: Secondary | ICD-10-CM | POA: Diagnosis not present

## 2021-11-30 DIAGNOSIS — G4730 Sleep apnea, unspecified: Secondary | ICD-10-CM | POA: Diagnosis not present

## 2021-11-30 DIAGNOSIS — Z880 Allergy status to penicillin: Secondary | ICD-10-CM | POA: Diagnosis not present

## 2021-11-30 DIAGNOSIS — M1712 Unilateral primary osteoarthritis, left knee: Secondary | ICD-10-CM | POA: Diagnosis not present

## 2021-12-06 ENCOUNTER — Ambulatory Visit: Payer: Medicare Other | Admitting: Physical Medicine & Rehabilitation

## 2021-12-12 ENCOUNTER — Other Ambulatory Visit: Payer: Self-pay | Admitting: Family Medicine

## 2021-12-13 ENCOUNTER — Telehealth: Payer: Self-pay

## 2021-12-13 NOTE — Telephone Encounter (Signed)
Pt called stating that she was not feeling well. She is congested and has a cough that is keeping her up at night. She is requesting something to help her (cough syrup & decongestant) because she feels like she has phlegm and/or mucus down in her lungs. Advised pt that there were no available appts here today and PCP out of office this week. Pt does not know how to do video visits and states that she is not going out in this weather. She said she will call the pharmacy to see what they are willing to do to help her.

## 2021-12-13 NOTE — Telephone Encounter (Signed)
FYI  Please see below

## 2021-12-13 NOTE — Telephone Encounter (Signed)
noted 

## 2021-12-23 DIAGNOSIS — J209 Acute bronchitis, unspecified: Secondary | ICD-10-CM | POA: Diagnosis not present

## 2021-12-26 ENCOUNTER — Other Ambulatory Visit: Payer: Self-pay

## 2021-12-26 MED ORDER — IPRATROPIUM BROMIDE 0.03 % NA SOLN: 2.0000 | Freq: Two times a day (BID) | NASAL | 11 refills | Status: DC

## 2021-12-31 ENCOUNTER — Other Ambulatory Visit: Payer: Self-pay | Admitting: Family Medicine

## 2021-12-31 NOTE — Telephone Encounter (Signed)
RF request for Diflucan LOV: 10/11/21 Next ov: 01/11/22 Last written: 10/16/21(15,1)  Please review and advise. Med pending

## 2022-01-01 ENCOUNTER — Ambulatory Visit: Payer: Medicare Other | Admitting: Family Medicine

## 2022-01-01 DIAGNOSIS — Z20822 Contact with and (suspected) exposure to covid-19: Secondary | ICD-10-CM | POA: Diagnosis not present

## 2022-01-09 ENCOUNTER — Telehealth: Payer: Self-pay | Admitting: *Deleted

## 2022-01-09 ENCOUNTER — Other Ambulatory Visit: Payer: Self-pay | Admitting: Physical Medicine & Rehabilitation

## 2022-01-09 DIAGNOSIS — M1712 Unilateral primary osteoarthritis, left knee: Secondary | ICD-10-CM

## 2022-01-09 DIAGNOSIS — M1711 Unilateral primary osteoarthritis, right knee: Secondary | ICD-10-CM

## 2022-01-09 MED ORDER — OXYCODONE-ACETAMINOPHEN 10-325 MG PO TABS: 1.0000 | ORAL_TABLET | Freq: Four times a day (QID) | ORAL | 0 refills | Status: DC | PRN

## 2022-01-09 NOTE — Telephone Encounter (Signed)
Oxycodone sent.

## 2022-01-09 NOTE — Telephone Encounter (Signed)
Linda [name] called for a refill on her oxycodone. She will be out before her appt 01/30/22 with Zella Ball.

## 2022-01-10 ENCOUNTER — Encounter: Payer: Self-pay | Admitting: *Deleted

## 2022-01-10 DIAGNOSIS — Z20822 Contact with and (suspected) exposure to covid-19: Secondary | ICD-10-CM | POA: Diagnosis not present

## 2022-01-11 ENCOUNTER — Other Ambulatory Visit: Payer: Self-pay | Admitting: Registered Nurse

## 2022-01-11 ENCOUNTER — Encounter: Payer: Self-pay | Admitting: Family Medicine

## 2022-01-11 ENCOUNTER — Telehealth (INDEPENDENT_AMBULATORY_CARE_PROVIDER_SITE_OTHER): Payer: Medicare Other | Admitting: Family Medicine

## 2022-01-11 ENCOUNTER — Other Ambulatory Visit: Payer: Self-pay

## 2022-01-11 VITALS — BP 143/85

## 2022-01-11 DIAGNOSIS — N3941 Urge incontinence: Secondary | ICD-10-CM | POA: Diagnosis not present

## 2022-01-11 DIAGNOSIS — M1712 Unilateral primary osteoarthritis, left knee: Secondary | ICD-10-CM

## 2022-01-11 DIAGNOSIS — F339 Major depressive disorder, recurrent, unspecified: Secondary | ICD-10-CM | POA: Diagnosis not present

## 2022-01-11 DIAGNOSIS — E7800 Pure hypercholesterolemia, unspecified: Secondary | ICD-10-CM

## 2022-01-11 DIAGNOSIS — F411 Generalized anxiety disorder: Secondary | ICD-10-CM | POA: Diagnosis not present

## 2022-01-11 DIAGNOSIS — M1711 Unilateral primary osteoarthritis, right knee: Secondary | ICD-10-CM

## 2022-01-11 DIAGNOSIS — I10 Essential (primary) hypertension: Secondary | ICD-10-CM

## 2022-01-11 MED ORDER — OXYBUTYNIN CHLORIDE 5 MG PO TABS: 5.0000 mg | ORAL_TABLET | Freq: Three times a day (TID) | ORAL | 1 refills | Status: DC

## 2022-01-11 NOTE — Progress Notes (Signed)
Virtual Visit via Video Note  I connected with Linda [name] on 01/11/22 at  1:00 PM EST by a video enabled telemedicine application and verified that I am speaking with the correct person using two identifiers.  Location patient: [place] Location provider:work or home office Persons participating in the virtual visit: patient, provider  I discussed the limitations and requested verbal permission for telemedicine visit. The patient expressed understanding and agreed to proceed.  HPI: 76 year old female being seen today for 2-month follow-up for hypertension, HLD, anxiety and depression. A/P as of last visit: "1) HTN, uncontrolled.  Has been out of imdur.  RF'd this today: 60mg  qAM and 30mg  qpm.  Cont toprol xl 100 qd.  Take extra 50mg  dose if SBP >160. Encouraged her to keep f/u appt set for 10/25 with Dr. Stanford Breed. Lytes/cr today.   2) HLD: nonosbt CAD: LDL goal 70.  Tolerating rosuva 20mg  qd. LDL was 74 six months ago. She last at about 6 hours ago.  Will get lipid panel today.   3) Recurrent, treatment resistant MDD.  GAD. Stable on lamictal 150 qd and duloxetine 90mg  qd.  Cont this as well as xanax 1mg  tid. Alpraz RF today.  CSC UTD.  UDS utd through her pain/interventional specialist.   4) Recurrent UTI, hx of OAB unresponsive to meds. She has appt with Dr. Gloriann Loan in urology next month to get further evaluation.   5) Preventative health care: Due for screening mammogram (last was 2018) and f/u DEXA (last was 2018) but she wants to defer these for now. Flu vaccine today.   6) Recurrent falls/debilitated pt: d/t chronic and debilitating bilat knee osteoarthritis. Ongoing care via meds + injections with Dr. Letta Pate. She states she has gotten max benefit from PT and declines any further.   7) Chronic hypoxic resp failure: copd +pulm fibrosis/asbestosis. Oxygenating well on RA today. Stable, cont symbicort, oxygen prn daytime + nocturnal, albut prn."  INTERIM HX: Stable from an anxiety and  depression standpoint. Her cardiologist has her on 80 mg of Lasix in the morning and 40 in the evening and this is significantly helped her lower extremity swelling.  Unfortunately, though, it seems to be making her urge incontinence more of a problem.  She says the Vesicare 10 mg a day helps for about 3 hours only.  Myrbetriq trial in the past was ineffective. Since I last saw her she an episode of ureterolithiasis.  She got cystoscopy with stone extraction and ureteral stent placement on 11/30/2021. She has not seen the urologist for her urge incontinence problem.    BMET and lipids normal last visit.   PMP AWARE reviewed today: most recent rx for alprazolam was filled 12/14/21, # 37, rx by me. Her pain med is rx'd by Dr. Letta Pate. No red flags.   Allergies  Allergen Reactions   Abilify [Aripiprazole] Other (See Comments)    parkinsonism   Bactrim [Sulfamethoxazole-Trimethoprim] Rash   Penicillins Itching, Swelling and Rash    Tolerated Cefepime in ED. Has patient had a PCN reaction causing immediate rash, facial/tongue/throat swelling, SOB or lightheadedness with hypotension: Yes Has patient had a PCN reaction causing severe rash involving mucus membranes or skin necrosis: No Has patient had a PCN reaction that required hospitalization: No  Has patient had a PCN reaction occurring within the last 10 years: No    -COVID-19 vaccine status:  Immunization History  Administered Date(s) Administered   Fluad Quad(high Dose 65+) 09/17/2019, 09/29/2020, 10/11/2021   Influenza Split 08/23/2012   Influenza, High  Dose Seasonal PF 10/01/2016, 09/04/2018   Influenza,inj,Quad PF,6+ Mos 09/28/2013, 10/05/2014   Pneumococcal Conjugate-13 05/03/2015   Pneumococcal Polysaccharide-23 03/24/2011     ROS: See pertinent positives and negatives per HPI.  Past Medical History:  Diagnosis Date   Acute upper GI bleed 06/2014   while pt taking coumadin, plavix, and meloxicam---despite being told  not to take coumadin.   Anginal pain ([place])    Nonobstructive CAD 2014; however, her cardiologist put her on a statin for this and NOT for hyperlipidemia per pt report.  Atyp CP 08/2017 at card f/u, plan for myoc perf imaging.   Anxiety    panic attacks   Asthma    w/ asbestososis    BPPV (benign paroxysmal positional vertigo) 12/16/2012   Chronic diastolic CHF (congestive heart failure) (HCC)    dry wt as of 11/06/16 is 168 lbs.   Chronic lower back pain    COPD (chronic obstructive pulmonary disease) (HCC)    DDD (degenerative disc disease)    lumbar and cervical.    Diverticular disease    Fibromyalgia    Patient states dx was around her late 43s but she had sx's for years prior to this.   H/O hiatal hernia    History of pneumonia    hospitalized 12/2011, 02/2013, and 07/2013 [facility]) for this   HTN (hypertension)    Renal artery dopplers 04/2013 neg for stenosis.   Hypervitaminosis D 09/27/2019   over-supplemented.  Stopped vit D and plan recheck 2 mo.   Idiopathic angio-edema-urticaria 72014; 2021   Angioedema component was very minimal.  2021->Dr. Bobbitt (allergist) eval.   Insomnia    Iron deficiency anemia    Hematologist in [place], [place] did extensive w/u; no cause found; failed oral supplement;; gets fairly regular (q44m or so) IV iron infusions (Venofer -iron sucrose- 200mg  with procrit.  "for 14 yr I've been getting blood work q month & getting infusions prn" (07/12/2013).  Dr. Marin Olp locally, iron infusions done, EPO deficiency dx'd   Kidney stone    Migraine syndrome    "not as often anymore; used to be ~ q wk" (07/12/2013)   Mixed incontinence urge and stress    Nephrolithiasis    "passed all on my own or they are still in there" (07/12/2013)   Neuroleptic induced parkinsonism (HCC) 2018   Dr. Carles Collet, neuro, saw her 11/24/17 and recommended d/c of abilify as first step.  D/c'd abilify and pt got complete recovery.   Oropharyngeal dysphagia    swallowing study speech path  05/2020. Gastric bx's showed gastritis, h pylori NEG   OSA on CPAP    prior to move to [place]--had another sleep study 10/2015 w/pulm Dr. Camillo Flaming.   Osteoarthritis    "severe; progressing fast" (07/12/2013); multiple joints-not surgical candidate for TKR (03/2015).  Triamcinolon knee injections by Dr. Tessa Lerner 12/2017.   Pernicious anemia 08/24/2014   Pleural plaque with presence of asbestos 07/22/2013   Pulmonary embolism ([place]) 07/2013   Dx at [facility] with very small peripheral upper lobe pe 07/2013: pt took coumadin for about 8-9 mo   Pyelonephritis    "several times over the last 30 yr" (07/12/2013)   RBBB (right bundle branch block)    Recurrent major depression ([place])    Recurrent UTI    hx of hospitalization for pyelonephritis; started abx prophylaxis 06/2015   Syncope    Hypotensive; ED visit--Dr. Terrence Dupont did Cath--nonobstructive CAD, EF 55-60%.  In retrospect, suspect pt rx med misuse/polypharmacy    Past  Surgical History:  Procedure Laterality Date   APPENDECTOMY  1960   AXILLARY SURGERY Left 1978   Multiple "lump" in armpit per pt   BIOPSY  06/17/2020   Gastric bx->gastritis, h pylori neg.  Procedure: BIOPSY;  Surgeon: Carol Ada, MD;  Location: [facility];  Service: Endoscopy;;   CARDIAC CATHETERIZATION  01/2013   nonobstructive CAD, EF 55-60%   CARDIOVASCULAR STRESS TEST  02/22/15   Low risk myocard perf imaging; wall motion normal, normal EF   carotid duplex doppler  10/21/2017   R vertebral flow suggestive of possible distal obstruction.  Pt declined further w/u as of 10/29/17 but need to revisit this problem periodically.   COCCYX REMOVAL  1972   DEXA  06/05/2017   T-score -3.1   DILATION AND CURETTAGE OF UTERUS  ? 1970's   ESOPHAGOGASTRODUODENOSCOPY N/A 07/19/2014   Gastritis found + in the setting of supratherapeutic INR, +plavix, + meloxicam.   ESOPHAGOGASTRODUODENOSCOPY (EGD) WITH PROPOFOL N/A 06/17/2020   NO stricture or other prob to explain pt's dysphagia, dilation was done  anyway.  Gastric bx's-->gastritis, h pylori neg. Procedure: ESOPHAGOGASTRODUODENOSCOPY (EGD) WITH PROPOFOL;  Surgeon: Carol Ada, MD;  Location: [facility];  Service: Endoscopy;  Laterality: N/A;   EYE SURGERY Left 2012-2013   "injections for ~ 1 yr; don't really know what for" (07/12/2013)   HEEL SPUR SURGERY Left 2008   KNEE SURGERY  2005   LEFT HEART CATHETERIZATION WITH CORONARY ANGIOGRAM N/A 01/30/2013   Procedure: LEFT HEART CATHETERIZATION WITH CORONARY ANGIOGRAM;  Surgeon: Clent Demark, MD;  Location: [facility];  Service: Cardiovascular;  Laterality: N/A;   MALONEY DILATION  06/17/2020   Procedure: Venia Minks DILATION;  Surgeon: Carol Ada, MD;  Location: [facility];  Service: Endoscopy;;   PLANTAR FASCIA RELEASE Left 2008   SPIROMETRY  04/25/14   In hosp for acute asthma/COPD flare: mixed obstructive and restrictive lung disease. The FEV1 is severely reduced at 45% predicted.  FEV1 signif decreased compared to prior spirometry 07/23/13.   TENDON RELEASE  1996   Right forearm and hand   TOTAL ABDOMINAL HYSTERECTOMY  1974   TRANSTHORACIC ECHOCARDIOGRAM  01/2013; 04/2014;08/2015; 09/2017   2014--NORMAL.  2015--focal basal septal hypertrophy, EF 55-60%, grade I diast dysfxn, mild LAE.  08/2015 EF 55-60%, nl LV syst fxn, grade I DD, valves wnl. 10/21/17: EF 65-70%, grd I DD, o/w normal.     Current Outpatient Medications:    albuterol (VENTOLIN HFA) 108 (90 Base) MCG/ACT inhaler, Inhale 1-2 puffs into the lungs every 6 (six) hours as needed for wheezing or shortness of breath., Disp: 18 g, Rfl: 1   alendronate (FOSAMAX) 70 MG tablet, Take 1 tablet (70 mg total) by mouth every 7 (seven) days. Take with a full glass of water on an empty stomach, first thing in the morning and remain up right for 30 minutes after taking., Disp: 12 tablet, Rfl: 3   ALPRAZolam (XANAX) 1 MG tablet, TAKE 1 TABLET THREE TIMES DAILY AS NEEDED FOR ANXIETY., Disp: 90 tablet, Rfl: 5   aspirin 81 MG tablet, Take 81 mg  by mouth at bedtime., Disp: , Rfl:    budesonide-formoterol (SYMBICORT) 80-4.5 MCG/ACT inhaler, 2 puffs, Disp: , Rfl:    cyclobenzaprine (FLEXERIL) 5 MG tablet, Take 1 tablet (5 mg total) by mouth 2 (two) times daily as needed for muscle spasms., Disp: 60 tablet, Rfl: 3   diclofenac Sodium (VOLTAREN) 1 % GEL, Apply 2 g topically 4 (four) times daily. 4 times a day to  both knees, Disp: 300 g, Rfl: 4   docusate sodium (COLACE) 100 MG capsule, Take 1 capsule (100 mg total) by mouth 2 (two) times daily., Disp: 10 capsule, Rfl: 0   donepezil (ARICEPT) 10 MG tablet, Take 1 tablet (10 mg total) by mouth at bedtime. must have office visit, Disp: 90 tablet, Rfl: 3   DULoxetine (CYMBALTA) 30 MG capsule, TAKE 1 CAPSULE ONCE DAILY ALONG WITH 60MG  CAPSULE FOR TOTAL 90MG  DAILY., Disp: 90 capsule, Rfl: 1   DULoxetine (CYMBALTA) 60 MG capsule, TAKE 1 CAPSULE A DAY TO BE COMBINED WITH 30MG  CAPSULE, Disp: 90 capsule, Rfl: 1   fluconazole (DIFLUCAN) 100 MG tablet, TAKE (1) TABLET DAILY AS NEEDED FOR THRUSH, Disp: 15 tablet, Rfl: 1   fluticasone (FLONASE) 50 MCG/ACT nasal spray, USE 2 SPRAYS IN EACH NOSTRIL ONCE DAILY, Disp: 16 g, Rfl: 5   furosemide (LASIX) 40 MG tablet, Take 1 tablet (40 mg total) by mouth daily as needed for fluid., Disp: 30 tablet, Rfl: 6   ibuprofen (ADVIL) 600 MG tablet, Take 1 tablet (600 mg total) by mouth every 6 (six) hours as needed., Disp: 30 tablet, Rfl: 0   ipratropium (ATROVENT) 0.03 % nasal spray, Place 2 sprays into both nostrils every 12 (twelve) hours., Disp: 30 mL, Rfl: 11   isosorbide mononitrate (IMDUR) 30 MG 24 hr tablet, TAKE 2 TABLETS IN THE AM AND 1 TABLET IN THE PM., Disp: 270 tablet, Rfl: 3   lamoTRIgine (LAMICTAL) 150 MG tablet, TAKE 1 TABLET EACH DAY., Disp: 90 tablet, Rfl: 0   levofloxacin (LEVAQUIN) 500 MG tablet, Take 500 mg by mouth daily., Disp: , Rfl:    lidocaine (LIDODERM) 5 %, Place 1 patch onto the skin daily. Remove & Discard patch within 12 hours or as directed  by MD, Disp: 8 patch, Rfl: 0   metoprolol succinate (TOPROL-XL) 100 MG 24 hr tablet, take ONE tab daily, take additional 1/2 tab as needed FOR FOR BLOOD PRESSURE > 160/90, Disp: 135 tablet, Rfl: 0   metroNIDAZOLE (METROGEL) 0.75 % gel, Apply 1 application topically at bedtime., Disp: , Rfl:    Multiple Vitamin (MULTIVITAMIN WITH MINERALS) TABS tablet, Take 1 tablet by mouth daily., Disp: , Rfl:    mupirocin ointment (BACTROBAN) 2 %, Apply 1 application topically 2 (two) times daily. Can be ointment or cream- whichever on formulary and cheapest for her, Disp: 22 g, Rfl: 0   ondansetron (ZOFRAN) 8 MG tablet, Take 1 tablet (8 mg total) by mouth every 8 (eight) hours as needed for nausea or vomiting., Disp: 30 tablet, Rfl: 6   oxyCODONE-acetaminophen (PERCOCET) 10-325 MG tablet, Take 1 tablet by mouth every 6 (six) hours as needed for pain., Disp: 120 tablet, Rfl: 0   pantoprazole (PROTONIX) 40 MG tablet, TAKE 1 TABLET BY MOUTH TWICE DAILY., Disp: 180 tablet, Rfl: 1   rosuvastatin (CRESTOR) 20 MG tablet, Take 1 tablet (20 mg total) by mouth daily., Disp: 90 tablet, Rfl: 3   solifenacin (VESICARE) 10 MG tablet, Take 1 tablet (10 mg total) by mouth daily., Disp: 90 tablet, Rfl: 1   traZODone (DESYREL) 50 MG tablet, TAKE 2-4 TABLETS AT BEDTIME, Disp: 120 tablet, Rfl: 11   EPINEPHrine 0.3 mg/0.3 mL IJ SOAJ injection, Inject 0.3 mg into the muscle as needed for anaphylaxis. (Patient not taking: Reported on 01/11/2022), Disp: 1 each, Rfl: 1   nitroGLYCERIN (NITROSTAT) 0.4 MG SL tablet, Place 1 tablet (0.4 mg total) under the tongue every 5 (five) minutes as needed for chest  pain (x 3 doses). (Patient not taking: Reported on 01/11/2022), Disp: 25 tablet, Rfl: 11  EXAM:  VITALS per patient if applicable:  GENERAL: alert, oriented, appears well and in no acute distress  HEENT: atraumatic, conjunttiva clear, no obvious abnormalities on inspection of external nose and ears  NECK: normal movements of the head  and neck  LUNGS: on inspection no signs of respiratory distress, breathing rate appears normal, no obvious gross SOB, gasping or wheezing  CV: no obvious cyanosis  MS: moves all visible extremities without noticeable abnormality  PSYCH/NEURO: pleasant and cooperative, no obvious depression or anxiety, speech and thought processing grossly intact  LABS: none today    Chemistry      Component Value Date/Time   NA 138 10/11/2021 1344   NA 141 06/05/2015 1315   K 4.1 10/11/2021 1344   K 3.9 06/05/2015 1315   CL 102 10/11/2021 1344   CL 103 06/05/2015 1315   CO2 30 10/11/2021 1344   CO2 28 06/05/2015 1315   BUN 14 10/11/2021 1344   BUN 14 06/05/2015 1315   CREATININE 0.78 10/11/2021 1344   CREATININE 0.87 02/03/2017 1523      Component Value Date/Time   CALCIUM 9.0 10/11/2021 1344   CALCIUM 9.0 06/05/2015 1315   ALKPHOS 65 07/16/2021 1145   ALKPHOS 84 06/05/2015 1315   AST 21 07/16/2021 1145   AST 23 06/05/2015 1315   ALT 17 07/16/2021 1145   ALT 17 06/05/2015 1315   BILITOT 0.4 07/16/2021 1145   BILITOT 1.00 06/05/2015 1315     Lab Results  Component Value Date   WBC 7.2 11/25/2020   HGB 13.3 11/25/2020   HCT 41.5 11/25/2020   MCV 89.4 11/25/2020   PLT 184 11/25/2020   Lab Results  Component Value Date   CHOL 129 10/11/2021   HDL 44.80 10/11/2021   LDLCALC 61 10/11/2021   TRIG 117.0 10/11/2021   CHOLHDL 3 10/11/2021   Lab Results  Component Value Date   HGBA1C 5.6 01/02/2016   Lab Results  Component Value Date   TSH 2.750 10/04/2019   ASSESSMENT AND PLAN:  Discussed the following assessment and plan:  1) HTN, not ideal control.   Cont toprol xl 100 qd.  Take extra 50mg  dose if SBP >160. Continue Imdur 60 mg every morning and 30 mg every afternoon. Encouraged her to keep f/u appt set for 10/25 with Dr. Stanford Breed. Lytes/cr today.   2) HLD: nonosbt CAD: LDL goal 70.  Tolerating rosuva 20mg  qd. LDL was 61 three months ago. Plan lipid panel 3-6 mo.    3) Recurrent, treatment resistant MDD.  GAD. Stable on lamictal 150 qd and duloxetine 90mg  qd.  Cont this as well as xanax 1mg  tid. Alpraz rx was not needed today.  CSC UTD.  UDS utd through her pain/interventional specialist.   4) Recurrent UTI, hx of OAB unresponsive to meds. I referred her to urology September 2022 Per my last note she was to have an initial evaluation with him but is not clear whether this happened.  Since that time she apparently has seen a urologist in [place] regarding the kidney stone issue but not for urgent continence.  Stop Vesicare.  The only other medication she is tried has been Myrbetriq and failed this. Will try oxybutynin 5 mg short acting, 1 3 times daily.  We will try to clarify whether she has seen Dr. Gloriann Loan yet.  If not will encourage her to repeat visit her urologist that she  recently saw in [place] for her stone.  5) Recurrent falls/debilitated pt: d/t chronic and debilitating bilat knee osteoarthritis. Ongoing care via meds + injections with Dr. Letta Pate. She states she has gotten max benefit from PT and declines any further.   6) Chronic hypoxic resp failure, hx of: copd +pulm fibrosis/asbestosis. Stable, cont symbicort, oxygen prn daytime + nocturnal, albut prn."   I discussed the assessment and treatment plan with the patient. The patient was provided an opportunity to ask questions and all were answered. The patient agreed with the plan and demonstrated an understanding of the instructions.   F/u: 3 mo  Signed:  Crissie Sickles, MD           01/11/2022

## 2022-01-17 ENCOUNTER — Other Ambulatory Visit: Payer: Self-pay

## 2022-01-17 DIAGNOSIS — M1712 Unilateral primary osteoarthritis, left knee: Secondary | ICD-10-CM

## 2022-01-17 DIAGNOSIS — M1711 Unilateral primary osteoarthritis, right knee: Secondary | ICD-10-CM

## 2022-01-17 MED ORDER — CYCLOBENZAPRINE HCL 5 MG PO TABS: 5.0000 mg | ORAL_TABLET | Freq: Two times a day (BID) | ORAL | 3 refills | Status: DC | PRN

## 2022-01-18 DIAGNOSIS — S51811A Laceration without foreign body of right forearm, initial encounter: Secondary | ICD-10-CM | POA: Diagnosis not present

## 2022-01-23 ENCOUNTER — Other Ambulatory Visit: Payer: Self-pay | Admitting: Family Medicine

## 2022-01-30 ENCOUNTER — Encounter: Payer: Medicare Other | Admitting: Registered Nurse

## 2022-02-08 ENCOUNTER — Encounter: Payer: Medicare Other | Attending: Physical Medicine & Rehabilitation | Admitting: Registered Nurse

## 2022-02-08 ENCOUNTER — Encounter: Payer: Self-pay | Admitting: Registered Nurse

## 2022-02-08 ENCOUNTER — Other Ambulatory Visit: Payer: Self-pay

## 2022-02-08 VITALS — BP 144/77 | HR 77 | Temp 98.7°F | Resp 92 | Ht 64.0 in | Wt 143.2 lb

## 2022-02-08 DIAGNOSIS — M1711 Unilateral primary osteoarthritis, right knee: Secondary | ICD-10-CM | POA: Diagnosis not present

## 2022-02-08 DIAGNOSIS — M546 Pain in thoracic spine: Secondary | ICD-10-CM | POA: Insufficient documentation

## 2022-02-08 DIAGNOSIS — M25511 Pain in right shoulder: Secondary | ICD-10-CM | POA: Diagnosis not present

## 2022-02-08 DIAGNOSIS — G894 Chronic pain syndrome: Secondary | ICD-10-CM | POA: Insufficient documentation

## 2022-02-08 DIAGNOSIS — Z79899 Other long term (current) drug therapy: Secondary | ICD-10-CM | POA: Diagnosis not present

## 2022-02-08 DIAGNOSIS — M1712 Unilateral primary osteoarthritis, left knee: Secondary | ICD-10-CM | POA: Insufficient documentation

## 2022-02-08 DIAGNOSIS — Z5181 Encounter for therapeutic drug level monitoring: Secondary | ICD-10-CM | POA: Insufficient documentation

## 2022-02-08 DIAGNOSIS — M5416 Radiculopathy, lumbar region: Secondary | ICD-10-CM | POA: Diagnosis not present

## 2022-02-08 DIAGNOSIS — G8929 Other chronic pain: Secondary | ICD-10-CM | POA: Diagnosis not present

## 2022-02-08 DIAGNOSIS — M25512 Pain in left shoulder: Secondary | ICD-10-CM | POA: Diagnosis not present

## 2022-02-08 MED ORDER — OXYCODONE-ACETAMINOPHEN 10-325 MG PO TABS: 1.0000 | ORAL_TABLET | Freq: Four times a day (QID) | ORAL | 0 refills | Status: DC | PRN

## 2022-02-08 NOTE — Progress Notes (Signed)
Subjective:    Patient ID: Linda [name], female    DOB: April 19, 1946, 76 y.o.   MRN: [id number]  HPI: Linda [name] is a 76 y.o. female who returns for follow up appointment for chronic pain and medication refill. She states her pain is located in her bilateral shoulders, mid- lower back pain radiating into her bilateral lower extremities and bilateral knee pain. Linda [name] requesting her Zilretta injection, she will be scheduled with [name], she verbalizes understanding. She rates her pain 8.Her current exercise regime is walking and performing stretching exercises.   Linda [name] Morphine equivalent is 60.00 MME. She is also prescribed Alprazolam  by Dr. Ernestine Conrad .We have discussed the black box warning of using opioids and benzodiazepines. I highlighted the dangers of using these drugs together and discussed the adverse events including respiratory suppression, overdose, cognitive impairment and importance of compliance with current regimen. We will continue to monitor and adjust as indicated.    Oral Swab was Performed today.   Pain Inventory Average Pain 7 Pain Right Now 8 My pain is constant, sharp, dull, stabbing, and aching  In the last 24 hours, has pain interfered with the following? General activity 9 Relation with others 10 Enjoyment of life 10 What TIME of day is your pain at its worst? morning , daytime, evening, and night Sleep (in general) Poor  Pain is worse with: walking, bending, standing, and some activites Pain improves with: rest, medication, and injections Relief from Meds: 5  Family History  Problem Relation Age of Onset   Arthritis Mother    Kidney disease Mother    Heart disease Father    Stroke Father    Hypertension Father    Diabetes Father    Heart attack Father    Heart attack Paternal Grandmother    Diabetes Sister        one sister   Hypertension Sister    Asthma Sister    Hypertension Brother    Asthma Brother    Asthma Daughter     Multiple sclerosis Son    Social History   Socioeconomic History   Marital status: Widowed    Spouse name: Not on file   Number of children: 2   Years of education: Not on file   Highest education level: Not on file  Occupational History   Occupation: Retired    Comment: Pharmacist, 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  Relocated to [place] 09/2012 to be closer to her son who has MS.   Husband d 2015--mesothelioma.   Occupation: former Pharmacist, hospital.   Education: masters degree level.   No T/A/Ds.   Lives in [place], independent living.   Social Determinants of Health   Financial Resource Strain: Low Risk    Difficulty of Paying Living Expenses: Not hard at all  Food Insecurity: No Food Insecurity   Worried About Charity fundraiser in the Last Year: Never true   Ran Out of Food in the Last Year: Never true  Transportation Needs: No Transportation Needs   Lack  of Transportation (Medical): No   Lack of Transportation (Non-Medical): No  Physical Activity: Insufficiently Active   Days of Exercise per Week: 2 days   Minutes of Exercise per Session: 20 min  Stress: No Stress Concern Present   Feeling of Stress : Not at all  Social Connections: Moderately Isolated   Frequency of Communication with Friends and Family: Three times a week   Frequency of Social Gatherings with Friends and Family: More than three times a week   Attends Religious Services: More than 4 times per year   Active Member of Clubs or Organizations: No   Attends Archivist Meetings: Never   Marital Status: Widowed   Past Surgical History:  Procedure Laterality  Date   APPENDECTOMY  1960   AXILLARY SURGERY Left 1978   Multiple "lump" in armpit per pt   BIOPSY  06/17/2020   Gastric bx->gastritis, h pylori neg.  Procedure: BIOPSY;  Surgeon: Carol Ada, MD;  Location: [facility];  Service: Endoscopy;;   CARDIAC CATHETERIZATION  01/2013   nonobstructive CAD, EF 55-60%   CARDIOVASCULAR STRESS TEST  02/22/15   Low risk myocard perf imaging; wall motion normal, normal EF   carotid duplex doppler  10/21/2017   R vertebral flow suggestive of possible distal obstruction.  Pt declined further w/u as of 10/29/17 but need to revisit this problem periodically.   COCCYX REMOVAL  1972   DEXA  06/05/2017   T-score -3.1   DILATION AND CURETTAGE OF UTERUS  ? 1970's   ESOPHAGOGASTRODUODENOSCOPY N/A 07/19/2014   Gastritis found + in the setting of supratherapeutic INR, +plavix, + meloxicam.   ESOPHAGOGASTRODUODENOSCOPY (EGD) WITH PROPOFOL N/A 06/17/2020   NO stricture or other prob to explain pt's dysphagia, dilation was done anyway.  Gastric bx's-->gastritis, h pylori neg. Procedure: ESOPHAGOGASTRODUODENOSCOPY (EGD) WITH PROPOFOL;  Surgeon: Carol Ada, MD;  [facility]: [facility];  Service: Endoscopy;  Laterality: N/A;   EYE SURGERY Left 2012-2013   "injections for ~ 1 yr; don't really know what for" (07/12/2013)   HEEL SPUR SURGERY Left 2008   KNEE SURGERY  2005   LEFT HEART CATHETERIZATION WITH CORONARY ANGIOGRAM N/A 01/30/2013   Procedure: LEFT HEART CATHETERIZATION WITH CORONARY ANGIOGRAM;  Surgeon: Clent Demark, MD;  Location: [facility];  Service: Cardiovascular;  Laterality: N/A;   MALONEY DILATION  06/17/2020   Procedure: Venia Minks DILATION;  Surgeon: Carol Ada, MD;  [facility]: [facility];  Service: Endoscopy;;   PLANTAR FASCIA RELEASE Left 2008   SPIROMETRY  04/25/14   In hosp for acute asthma/COPD flare: mixed obstructive and restrictive lung disease. The FEV1 is severely reduced at 45% predicted.  FEV1 signif decreased compared to prior spirometry  07/23/13.   TENDON RELEASE  1996   Right forearm and hand   TOTAL ABDOMINAL HYSTERECTOMY  1974   TRANSTHORACIC ECHOCARDIOGRAM  01/2013; 04/2014;08/2015; 09/2017   2014--NORMAL.  2015--focal basal septal hypertrophy, EF 55-60%, grade I diast dysfxn, mild LAE.  08/2015 EF 55-60%, nl LV syst fxn, grade I DD, valves wnl. 10/21/17: EF 65-70%, grd I DD, o/w normal.   Past Surgical History:  Procedure Laterality Date   APPENDECTOMY  1960   AXILLARY SURGERY Left 1978   Multiple "lump" in armpit per pt   BIOPSY  06/17/2020   Gastric bx->gastritis, h pylori neg.  Procedure: BIOPSY;  Surgeon: Carol Ada, MD;  [facility];  Service: Endoscopy;;   CARDIAC CATHETERIZATION  01/2013   nonobstructive CAD, EF 55-60%   CARDIOVASCULAR STRESS TEST  02/22/15   Low risk myocard perf imaging; wall motion normal, normal EF   carotid duplex doppler  10/21/2017   R vertebral flow suggestive of possible distal obstruction.  Pt declined further w/u as of 10/29/17 but need to revisit this problem periodically.   COCCYX REMOVAL  1972   DEXA  06/05/2017   T-score -3.1   DILATION AND CURETTAGE OF UTERUS  ? 1970's   ESOPHAGOGASTRODUODENOSCOPY N/A 07/19/2014   Gastritis found + in the setting of supratherapeutic INR, +plavix, + meloxicam.   ESOPHAGOGASTRODUODENOSCOPY (EGD) WITH PROPOFOL N/A 06/17/2020   NO stricture or other prob to explain pt's dysphagia, dilation was done anyway.  Gastric bx's-->gastritis, h pylori neg. Procedure: ESOPHAGOGASTRODUODENOSCOPY (EGD) WITH PROPOFOL;  Surgeon: Carol Ada, MD;  [facility];  Service: Endoscopy;  Laterality: N/A;   EYE SURGERY Left 2012-2013   "injections for ~ 1 yr; don't really know what for" (07/12/2013)   HEEL SPUR SURGERY Left 2008   KNEE SURGERY  2005   LEFT HEART CATHETERIZATION WITH CORONARY ANGIOGRAM N/A 01/30/2013   Procedure: LEFT HEART CATHETERIZATION WITH CORONARY ANGIOGRAM;  Surgeon: Clent Demark, MD;  Location: [facility];  Service:  Cardiovascular;  Laterality: N/A;   MALONEY DILATION  06/17/2020   Procedure: Venia Minks DILATION;  Surgeon: Carol Ada, MD;  [facility];  Service: Endoscopy;;   PLANTAR FASCIA RELEASE Left 2008   SPIROMETRY  04/25/14   In hosp for acute asthma/COPD flare: mixed obstructive and restrictive lung disease. The FEV1 is severely reduced at 45% predicted.  FEV1 signif decreased compared to prior spirometry 07/23/13.   TENDON RELEASE  1996   Right forearm and hand   TOTAL ABDOMINAL HYSTERECTOMY  1974   TRANSTHORACIC ECHOCARDIOGRAM  01/2013; 04/2014;08/2015; 09/2017   2014--NORMAL.  2015--focal basal septal hypertrophy, EF 55-60%, grade I diast dysfxn, mild LAE.  08/2015 EF 55-60%, nl LV syst fxn, grade I DD, valves wnl. 10/21/17: EF 65-70%, grd I DD, o/w normal.   Past Medical History:  Diagnosis Date   Acute upper GI bleed 06/2014   while pt taking coumadin, plavix, and meloxicam---despite being told not to take coumadin.   Anginal pain ([place])    Nonobstructive CAD 2014; however, her cardiologist put her on a statin for this and NOT for hyperlipidemia per pt report.  Atyp CP 08/2017 at card f/u, plan for myoc perf imaging.   Anxiety    panic attacks   Asthma    w/ asbestososis    BPPV (benign paroxysmal positional vertigo) 12/16/2012   Chronic diastolic CHF (congestive heart failure) (HCC)    dry wt as of 11/06/16 is 168 lbs.   Chronic lower back pain    COPD (chronic obstructive pulmonary disease) (HCC)    DDD (degenerative disc disease)    lumbar and cervical.    Diverticular disease    Fibromyalgia    Patient states dx was around her late 86s but she had sx's for years prior to this.   H/O hiatal hernia    History of pneumonia    hospitalized 12/2011, 02/2013, and 07/2013 [facility]) for this   HTN (hypertension)    Renal artery dopplers 04/2013 neg for stenosis.   Hypervitaminosis D 09/27/2019   over-supplemented.  Stopped vit D and plan recheck 2 mo.   Idiopathic angio-edema-urticaria  72014; 2021   Angioedema component was very minimal.  2021->Dr. Bobbitt (allergist) eval.   Insomnia    Iron deficiency anemia    Hematologist in [place], [place] did extensive w/u;  no cause found; failed oral supplement;; gets fairly regular (q7m or so) IV iron infusions (Venofer -iron sucrose- 200mg  with procrit.  "for 14 yr I've been getting blood work q month & getting infusions prn" (07/12/2013).  Dr. Marin Olp locally, iron infusions done, EPO deficiency dx'd   Kidney stone    Migraine syndrome    "not as often anymore; used to be ~ q wk" (07/12/2013)   Mixed incontinence urge and stress    Nephrolithiasis    "passed all on my own or they are still in there" (07/12/2013)   Neuroleptic induced parkinsonism (HCC) 2018   Dr. Carles Collet, neuro, saw her 11/24/17 and recommended d/c of abilify as first step.  D/c'd abilify and pt got complete recovery.   Oropharyngeal dysphagia    swallowing study speech path 05/2020. Gastric bx's showed gastritis, h pylori NEG   OSA on CPAP    prior to move to [place]--had another sleep study 10/2015 w/pulm Dr. Camillo Flaming.   Osteoarthritis    "severe; progressing fast" (07/12/2013); multiple joints-not surgical candidate for TKR (03/2015).  Triamcinolon knee injections by Dr. Tessa Lerner 12/2017.   Pernicious anemia 08/24/2014   Pleural plaque with presence of asbestos 07/22/2013   Pulmonary embolism ([place]) 07/2013   Dx at [facility] with very small peripheral upper lobe pe 07/2013: pt took coumadin for about 8-9 mo   Pyelonephritis    "several times over the last 30 yr" (07/12/2013)   RBBB (right bundle branch block)    Recurrent major depression ([place])    Recurrent UTI    hx of hospitalization for pyelonephritis; started abx prophylaxis 06/2015   Syncope    Hypotensive; ED visit--Dr. Terrence Dupont did Cath--nonobstructive CAD, EF 55-60%.  In retrospect, suspect pt rx med misuse/polypharmacy   BP (!) 144/77    Pulse 77    Temp 98.7 F (37.1 C)    Resp (!) 92    Ht 5\' 4"  (4.970 m)    Wt 143 lb 3.2  oz (65 kg)    BMI 24.58 kg/m   Opioid Risk Score:   Fall Risk Score:  `1  Depression screen PHQ 2/9  Depression screen [facility] 2/9 02/08/2022 01/11/2022 10/18/2021 08/02/2021 07/04/2021 07/04/2021 03/15/2021  Decreased Interest 1 0 1 0 0 0 1  Down, Depressed, Hopeless 1 0 1 0 0 0 1  PHQ - 2 Score 2 0 2 0 0 0 2  Altered sleeping - 2 - - - - -  Tired, decreased energy - 3 - - - - -  Change in appetite - 0 - - - - -  Feeling bad or failure about yourself  - 0 - - - - -  Trouble concentrating - 0 - - - - -  Moving slowly or fidgety/restless - 0 - - - - -  Suicidal thoughts - 0 - - - - -  PHQ-9 Score - 5 - - - - -  Difficult doing work/chores - Not difficult at all - - - - -  Some recent data might be hidden     Review of Systems  Constitutional: Negative.   HENT: Negative.    Eyes: Negative.   Respiratory: Negative.    Cardiovascular: Negative.   Gastrointestinal: Negative.   Endocrine: Negative.   Genitourinary: Negative.   Musculoskeletal:  Positive for back pain and gait problem.       Knee pain  Skin: Negative.   Allergic/Immunologic: Negative.   Hematological: Negative.   Psychiatric/Behavioral: Negative.    All other systems  reviewed and are negative.     Objective:   Physical Exam Vitals and nursing note reviewed.  Constitutional:      Appearance: Normal appearance.  Neck:     Comments: Cervical Paraspinal Tenderness: C-5-C-6  Cardiovascular:     Rate and Rhythm: Normal rate and regular rhythm.     Pulses: Normal pulses.     Heart sounds: Normal heart sounds.  Pulmonary:     Effort: Pulmonary effort is normal.     Breath sounds: Normal breath sounds.  Musculoskeletal:     Cervical back: Normal range of motion and neck supple.     Comments: Normal Muscle Bulk and Muscle Testing Reveals:  Upper Extremities: Full ROM and Muscle Strength 5/5 Thoracic Paraspinal Tenderness: T-7-T-9 Lumbar Hypersensitivity Lower Extremities: Decreased ROM and Muscle Strength  4/5 Right Lower Extremity: Right Lower Extremity Flexion Produces Pain into her Lumbar Left Lower Extremity Flexion Produces Pain into her Left Patella Arises from chair slowly Antalgic Gait     Skin:    General: Skin is warm and dry.  Neurological:     Mental Status: She is alert and oriented to person, place, and time.  Psychiatric:        Mood and Affect: Mood normal.        Behavior: Behavior normal.         Assessment & Plan:  1. Functional deficits secondary to Gait disorder:Continue with HEP. 02/08/2022. 2. Chronic Back pain/ Lumbar Radiculitis/fibromyalgia /R>L Knee OA Pain Management: 02/08/2022 Continue: Oxycodone 10/325 mg one tablet every 6 hours #120.  Second script sent for the following month.  Continue Voltaren Gel. We will continue the opioid monitoring program, this consists of regular clinic visits, examinations, urine drug screen, pill counts as well as use of [place] Controlled Substance Reporting system. A 12 month History has been reviewed on the [place] Controlled Substance Reporting System on 02/08/2022 3. Depression with anxiety/Grief reaction/Mood: 02/08/2022 Continue Xanax, Trazodone and Cymbalta . PCP Following.  4. [name] with asthma:. Albuterol prn. :Pulmonology Following. 02/08/2022 5. Bilateral Osteoarthritis of Bilateral Knees: Ms. Jaffe Requesting Bilateral Zilretta Injection, she will be scheduled with [name] for Bilateral Zilretta Injection with [name], she verbalizes understanding. S/P Bilateral Zilretta Injections on 11/28/2021 with good relief noted. Continue to Monitor. 02/08/2022 6. Fibromyalgia: Continue Home exercise regimen as tolerated. Continue to Monitor.02/08/2022 7. Cervicalgia/ Cervical Radiculitis: No complaints today.Continue HEP as tolerated. Alternate with heat and ice therapy. Continue to monitor. 02/08/2022 8. Bilateral  Greater Trochanteric Bursitis: Continue to  Alternate Ice and Heat Therapy. Continue to  Monitor. 02/08/2022. 9. Chronic Bilateral Shoulder Pain:  No complaints  Today. Continue HEP as Tolerated. Continue current medication regimen. Continue to Monitor. 02/08/2022     F/U in 2 months

## 2022-02-14 ENCOUNTER — Encounter: Payer: Medicare Other | Admitting: Registered Nurse

## 2022-02-15 ENCOUNTER — Encounter (HOSPITAL_BASED_OUTPATIENT_CLINIC_OR_DEPARTMENT_OTHER): Payer: Self-pay

## 2022-02-15 ENCOUNTER — Other Ambulatory Visit: Payer: Self-pay

## 2022-02-15 ENCOUNTER — Emergency Department (HOSPITAL_BASED_OUTPATIENT_CLINIC_OR_DEPARTMENT_OTHER): Payer: Medicare Other

## 2022-02-15 DIAGNOSIS — S0083XA Contusion of other part of head, initial encounter: Secondary | ICD-10-CM | POA: Diagnosis present

## 2022-02-15 DIAGNOSIS — R0602 Shortness of breath: Secondary | ICD-10-CM | POA: Diagnosis not present

## 2022-02-15 DIAGNOSIS — W19XXXA Unspecified fall, initial encounter: Secondary | ICD-10-CM | POA: Insufficient documentation

## 2022-02-15 DIAGNOSIS — Z79899 Other long term (current) drug therapy: Secondary | ICD-10-CM | POA: Diagnosis not present

## 2022-02-15 DIAGNOSIS — I10 Essential (primary) hypertension: Secondary | ICD-10-CM | POA: Diagnosis not present

## 2022-02-15 DIAGNOSIS — I2510 Atherosclerotic heart disease of native coronary artery without angina pectoris: Secondary | ICD-10-CM | POA: Diagnosis not present

## 2022-02-15 DIAGNOSIS — R404 Transient alteration of awareness: Secondary | ICD-10-CM | POA: Diagnosis not present

## 2022-02-15 DIAGNOSIS — S0231XA Fracture of orbital floor, right side, initial encounter for closed fracture: Secondary | ICD-10-CM | POA: Diagnosis not present

## 2022-02-15 DIAGNOSIS — Z20822 Contact with and (suspected) exposure to covid-19: Secondary | ICD-10-CM | POA: Diagnosis not present

## 2022-02-15 DIAGNOSIS — S0269XA Fracture of mandible of other specified site, initial encounter for closed fracture: Secondary | ICD-10-CM | POA: Diagnosis not present

## 2022-02-15 DIAGNOSIS — E876 Hypokalemia: Secondary | ICD-10-CM | POA: Insufficient documentation

## 2022-02-15 DIAGNOSIS — S0240CA Maxillary fracture, right side, initial encounter for closed fracture: Secondary | ICD-10-CM | POA: Diagnosis not present

## 2022-02-15 DIAGNOSIS — S199XXA Unspecified injury of neck, initial encounter: Secondary | ICD-10-CM | POA: Diagnosis not present

## 2022-02-15 DIAGNOSIS — Z7982 Long term (current) use of aspirin: Secondary | ICD-10-CM | POA: Diagnosis not present

## 2022-02-15 DIAGNOSIS — I110 Hypertensive heart disease with heart failure: Secondary | ICD-10-CM | POA: Insufficient documentation

## 2022-02-15 DIAGNOSIS — R296 Repeated falls: Secondary | ICD-10-CM | POA: Diagnosis not present

## 2022-02-15 DIAGNOSIS — I5032 Chronic diastolic (congestive) heart failure: Secondary | ICD-10-CM | POA: Diagnosis not present

## 2022-02-15 DIAGNOSIS — S0990XA Unspecified injury of head, initial encounter: Secondary | ICD-10-CM | POA: Diagnosis not present

## 2022-02-15 DIAGNOSIS — J45909 Unspecified asthma, uncomplicated: Secondary | ICD-10-CM | POA: Insufficient documentation

## 2022-02-15 DIAGNOSIS — Y92009 Unspecified place in unspecified non-institutional (private) residence as the place of occurrence of the external cause: Secondary | ICD-10-CM | POA: Insufficient documentation

## 2022-02-15 LAB — URINALYSIS, ROUTINE W REFLEX MICROSCOPIC
Glucose, UA: NEGATIVE mg/dL
Ketones, ur: 15 mg/dL — AB
Nitrite: NEGATIVE
Protein, ur: 30 mg/dL — AB
Specific Gravity, Urine: 1.02 (ref 1.005–1.030)
pH: 6 (ref 5.0–8.0)

## 2022-02-15 LAB — DRUG TOX MONITOR 1 W/CONF, ORAL FLD
Amphetamines: NEGATIVE ng/mL (ref <10)
Barbiturates: NEGATIVE ng/mL (ref <10)
Benzodiazepines: NEGATIVE ng/mL (ref <0.50)
Buprenorphine: NEGATIVE ng/mL (ref <0.10)
Cocaine: NEGATIVE ng/mL (ref <5.0)
Codeine: NEGATIVE ng/mL (ref <2.5)
Dihydrocodeine: NEGATIVE ng/mL (ref <2.5)
Fentanyl: NEGATIVE ng/mL (ref <0.10)
Heroin Metabolite: NEGATIVE ng/mL (ref <1.0)
Hydrocodone: NEGATIVE ng/mL (ref <2.5)
Hydromorphone: NEGATIVE ng/mL (ref <2.5)
MARIJUANA: NEGATIVE ng/mL (ref <2.5)
MDMA: NEGATIVE ng/mL (ref <10)
Meprobamate: NEGATIVE ng/mL (ref <2.5)
Methadone: NEGATIVE ng/mL (ref <5.0)
Morphine: NEGATIVE ng/mL (ref <2.5)
Nicotine Metabolite: NEGATIVE ng/mL (ref <5.0)
Norhydrocodone: NEGATIVE ng/mL (ref <2.5)
Noroxycodone: 5.7 ng/mL — ABNORMAL HIGH (ref <2.5)
Opiates: POSITIVE ng/mL — AB (ref <2.5)
Oxycodone: 39.7 ng/mL — ABNORMAL HIGH (ref <2.5)
Oxymorphone: NEGATIVE ng/mL (ref <2.5)
Phencyclidine: NEGATIVE ng/mL (ref <10)
Tapentadol: NEGATIVE ng/mL (ref <5.0)
Tramadol: NEGATIVE ng/mL (ref <5.0)
Zolpidem: NEGATIVE ng/mL (ref <5.0)

## 2022-02-15 LAB — BASIC METABOLIC PANEL
Anion gap: 11 (ref 5–15)
BUN: 12 mg/dL (ref 8–23)
CO2: 26 mmol/L (ref 22–32)
Calcium: 9 mg/dL (ref 8.9–10.3)
Chloride: 99 mmol/L (ref 98–111)
Creatinine, Ser: 0.91 mg/dL (ref 0.44–1.00)
GFR, Estimated: >60 mL/min (ref >60)
Glucose, Bld: 110 mg/dL — ABNORMAL HIGH (ref 70–99)
Potassium: 2.7 mmol/L — CL (ref 3.5–5.1)
Sodium: 136 mmol/L (ref 135–145)

## 2022-02-15 LAB — CBC
HCT: 39.1 % (ref 36.0–46.0)
Hemoglobin: 13.3 g/dL (ref 12.0–15.0)
MCH: 29.4 pg (ref 26.0–34.0)
MCHC: 34 g/dL (ref 30.0–36.0)
MCV: 86.5 fL (ref 80.0–100.0)
Platelets: 229 10*3/uL (ref 150–400)
RBC: 4.52 MIL/uL (ref 3.87–5.11)
RDW: 12.7 % (ref 11.5–15.5)
WBC: 6.7 10*3/uL (ref 4.0–10.5)
nRBC: 0 % (ref 0.0–0.2)

## 2022-02-15 LAB — URINALYSIS, MICROSCOPIC (REFLEX)

## 2022-02-15 LAB — DRUG TOX ALC METAB W/CON, ORAL FLD
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
## 2022-02-16 ENCOUNTER — Observation Stay (HOSPITAL_BASED_OUTPATIENT_CLINIC_OR_DEPARTMENT_OTHER)
Admission: EM | Admit: 2022-02-16 | Discharge: 2022-02-17 | Disposition: A | Discharge status: Home Health | Payer: Medicare Other | Attending: Internal Medicine | Admitting: Internal Medicine

## 2022-02-16 ENCOUNTER — Emergency Department (HOSPITAL_BASED_OUTPATIENT_CLINIC_OR_DEPARTMENT_OTHER): Payer: Medicare Other

## 2022-02-16 ENCOUNTER — Encounter (HOSPITAL_COMMUNITY): Payer: Self-pay | Admitting: Internal Medicine

## 2022-02-16 ENCOUNTER — Observation Stay (HOSPITAL_BASED_OUTPATIENT_CLINIC_OR_DEPARTMENT_OTHER): Payer: Medicare Other

## 2022-02-16 DIAGNOSIS — J449 Chronic obstructive pulmonary disease, unspecified: Secondary | ICD-10-CM | POA: Diagnosis present

## 2022-02-16 DIAGNOSIS — R404 Transient alteration of awareness: Secondary | ICD-10-CM | POA: Diagnosis not present

## 2022-02-16 DIAGNOSIS — W19XXXA Unspecified fall, initial encounter: Secondary | ICD-10-CM | POA: Diagnosis present

## 2022-02-16 DIAGNOSIS — I2510 Atherosclerotic heart disease of native coronary artery without angina pectoris: Secondary | ICD-10-CM | POA: Diagnosis not present

## 2022-02-16 DIAGNOSIS — E7800 Pure hypercholesterolemia, unspecified: Secondary | ICD-10-CM

## 2022-02-16 DIAGNOSIS — I110 Hypertensive heart disease with heart failure: Secondary | ICD-10-CM | POA: Diagnosis not present

## 2022-02-16 DIAGNOSIS — Z20822 Contact with and (suspected) exposure to covid-19: Secondary | ICD-10-CM | POA: Diagnosis not present

## 2022-02-16 DIAGNOSIS — J45909 Unspecified asthma, uncomplicated: Secondary | ICD-10-CM | POA: Diagnosis not present

## 2022-02-16 DIAGNOSIS — R0602 Shortness of breath: Secondary | ICD-10-CM | POA: Diagnosis not present

## 2022-02-16 DIAGNOSIS — S0269XA Fracture of mandible of other specified site, initial encounter for closed fracture: Secondary | ICD-10-CM | POA: Diagnosis not present

## 2022-02-16 DIAGNOSIS — R55 Syncope and collapse: Secondary | ICD-10-CM

## 2022-02-16 DIAGNOSIS — Z79899 Other long term (current) drug therapy: Secondary | ICD-10-CM | POA: Diagnosis not present

## 2022-02-16 DIAGNOSIS — F419 Anxiety disorder, unspecified: Secondary | ICD-10-CM | POA: Diagnosis present

## 2022-02-16 DIAGNOSIS — S0231XA Fracture of orbital floor, right side, initial encounter for closed fracture: Secondary | ICD-10-CM | POA: Diagnosis not present

## 2022-02-16 DIAGNOSIS — I5032 Chronic diastolic (congestive) heart failure: Secondary | ICD-10-CM | POA: Diagnosis not present

## 2022-02-16 DIAGNOSIS — Z7982 Long term (current) use of aspirin: Secondary | ICD-10-CM | POA: Diagnosis not present

## 2022-02-16 DIAGNOSIS — E785 Hyperlipidemia, unspecified: Secondary | ICD-10-CM | POA: Diagnosis present

## 2022-02-16 DIAGNOSIS — S0083XA Contusion of other part of head, initial encounter: Secondary | ICD-10-CM | POA: Diagnosis present

## 2022-02-16 DIAGNOSIS — E876 Hypokalemia: Secondary | ICD-10-CM | POA: Diagnosis not present

## 2022-02-16 DIAGNOSIS — G4733 Obstructive sleep apnea (adult) (pediatric): Secondary | ICD-10-CM | POA: Diagnosis present

## 2022-02-16 DIAGNOSIS — F32A Depression, unspecified: Secondary | ICD-10-CM | POA: Diagnosis present

## 2022-02-16 DIAGNOSIS — I10 Essential (primary) hypertension: Secondary | ICD-10-CM | POA: Diagnosis present

## 2022-02-16 DIAGNOSIS — S0240CA Maxillary fracture, right side, initial encounter for closed fracture: Secondary | ICD-10-CM

## 2022-02-16 DIAGNOSIS — S02401A Maxillary fracture, unspecified, initial encounter for closed fracture: Secondary | ICD-10-CM | POA: Diagnosis present

## 2022-02-16 DIAGNOSIS — S0285XA Fracture of orbit, unspecified, initial encounter for closed fracture: Secondary | ICD-10-CM | POA: Diagnosis present

## 2022-02-16 DIAGNOSIS — Y92009 Unspecified place in unspecified non-institutional (private) residence as the place of occurrence of the external cause: Secondary | ICD-10-CM | POA: Diagnosis not present

## 2022-02-16 DIAGNOSIS — R531 Weakness: Secondary | ICD-10-CM

## 2022-02-16 LAB — RESP PANEL BY RT-PCR (FLU A&B, COVID) ARPGX2
Influenza A by PCR: NEGATIVE
Influenza B by PCR: NEGATIVE
SARS Coronavirus 2 by RT PCR: NEGATIVE

## 2022-02-16 LAB — COMPREHENSIVE METABOLIC PANEL
ALT: 14 U/L (ref 0–44)
AST: 20 U/L (ref 15–41)
Albumin: 4.2 g/dL (ref 3.5–5.0)
Alkaline Phosphatase: 74 U/L (ref 38–126)
Anion gap: 8 (ref 5–15)
BUN: 9 mg/dL (ref 8–23)
CO2: 25 mmol/L (ref 22–32)
Calcium: 8.8 mg/dL — ABNORMAL LOW (ref 8.9–10.3)
Chloride: 104 mmol/L (ref 98–111)
Creatinine, Ser: 0.69 mg/dL (ref 0.44–1.00)
GFR, Estimated: >60 mL/min (ref >60)
Glucose, Bld: 128 mg/dL — ABNORMAL HIGH (ref 70–99)
Potassium: 3.2 mmol/L — ABNORMAL LOW (ref 3.5–5.1)
Sodium: 137 mmol/L (ref 135–145)
Total Bilirubin: 0.5 mg/dL (ref 0.3–1.2)
Total Protein: 7.5 g/dL (ref 6.5–8.1)

## 2022-02-16 LAB — CBC
HCT: 40.6 % (ref 36.0–46.0)
Hemoglobin: 13.6 g/dL (ref 12.0–15.0)
MCH: 29.4 pg (ref 26.0–34.0)
MCHC: 33.5 g/dL (ref 30.0–36.0)
MCV: 87.7 fL (ref 80.0–100.0)
Platelets: 234 10*3/uL (ref 150–400)
RBC: 4.63 MIL/uL (ref 3.87–5.11)
RDW: 12.8 % (ref 11.5–15.5)
WBC: 7.1 10*3/uL (ref 4.0–10.5)
nRBC: 0 % (ref 0.0–0.2)

## 2022-02-16 LAB — MAGNESIUM
Magnesium: 2 mg/dL (ref 1.7–2.4)
Magnesium: 2 mg/dL (ref 1.7–2.4)

## 2022-02-16 LAB — PHOSPHORUS: Phosphorus: 3.2 mg/dL (ref 2.5–4.6)

## 2022-02-16 MED ORDER — DULOXETINE HCL 30 MG PO CPEP
30.0000 mg | ORAL_CAPSULE | Freq: Every day | ORAL | Status: DC
  Administered 2022-02-16 – 2022-02-17 (×2): 30 mg via ORAL
  Filled 2022-02-16 (×2): qty 1

## 2022-02-16 MED ORDER — LAMOTRIGINE 25 MG PO TABS
150.0000 mg | ORAL_TABLET | Freq: Every day | ORAL | Status: DC
  Administered 2022-02-16 – 2022-02-17 (×2): 150 mg via ORAL
  Filled 2022-02-16: qty 2
  Filled 2022-02-16: qty 1

## 2022-02-16 MED ORDER — TRAZODONE HCL 50 MG PO TABS: 150.0000 mg | ORAL_TABLET | Freq: Every evening | ORAL | Status: DC | PRN

## 2022-02-16 MED ORDER — ALBUTEROL SULFATE (2.5 MG/3ML) 0.083% IN NEBU: 2.5000 mg | INHALATION_SOLUTION | Freq: Four times a day (QID) | RESPIRATORY_TRACT | Status: DC | PRN

## 2022-02-16 MED ORDER — OXYCODONE HCL 5 MG PO TABS
5.0000 mg | ORAL_TABLET | Freq: Four times a day (QID) | ORAL | Status: DC | PRN
  Administered 2022-02-16 – 2022-02-17 (×3): 5 mg via ORAL
  Filled 2022-02-16 (×4): qty 1

## 2022-02-16 MED ORDER — MAGNESIUM SULFATE 2 GM/50ML IV SOLN
2.0000 g | Freq: Once | INTRAVENOUS | Status: AC
  Administered 2022-02-16: 2 g via INTRAVENOUS
  Filled 2022-02-16: qty 50

## 2022-02-16 MED ORDER — POTASSIUM CHLORIDE CRYS ER 20 MEQ PO TBCR
40.0000 meq | EXTENDED_RELEASE_TABLET | Freq: Once | ORAL | Status: AC
  Administered 2022-02-16: 40 meq via ORAL
  Filled 2022-02-16: qty 2

## 2022-02-16 MED ORDER — SODIUM CHLORIDE 0.9 % IV SOLN: INTRAVENOUS | Status: DC | PRN

## 2022-02-16 MED ORDER — ASPIRIN EC 81 MG PO TBEC
81.0000 mg | DELAYED_RELEASE_TABLET | Freq: Every day | ORAL | Status: DC
  Administered 2022-02-16: 81 mg via ORAL
  Filled 2022-02-16: qty 1

## 2022-02-16 MED ORDER — DONEPEZIL HCL 10 MG PO TABS
10.0000 mg | ORAL_TABLET | Freq: Every day | ORAL | Status: DC
Start: 2022-02-16 — End: 2022-02-17
  Administered 2022-02-16: 10 mg via ORAL
  Filled 2022-02-16: qty 1

## 2022-02-16 MED ORDER — POTASSIUM CHLORIDE 10 MEQ/100ML IV SOLN
10.0000 meq | INTRAVENOUS | Status: AC
  Administered 2022-02-16 (×2): 10 meq via INTRAVENOUS
  Filled 2022-02-16 (×2): qty 100

## 2022-02-16 MED ORDER — PANTOPRAZOLE SODIUM 40 MG PO TBEC
40.0000 mg | DELAYED_RELEASE_TABLET | Freq: Two times a day (BID) | ORAL | Status: DC
  Administered 2022-02-16 – 2022-02-17 (×3): 40 mg via ORAL
  Filled 2022-02-16 (×3): qty 1

## 2022-02-16 MED ORDER — OXYCODONE-ACETAMINOPHEN 5-325 MG PO TABS
1.0000 | ORAL_TABLET | Freq: Four times a day (QID) | ORAL | Status: DC | PRN
  Administered 2022-02-16 – 2022-02-17 (×3): 1 via ORAL
  Filled 2022-02-16 (×3): qty 1

## 2022-02-16 MED ORDER — OXYBUTYNIN CHLORIDE 5 MG PO TABS
5.0000 mg | ORAL_TABLET | Freq: Three times a day (TID) | ORAL | Status: DC
  Administered 2022-02-16 – 2022-02-17 (×3): 5 mg via ORAL
  Filled 2022-02-16 (×3): qty 1

## 2022-02-16 MED ORDER — ISOSORBIDE MONONITRATE ER 30 MG PO TB24
30.0000 mg | ORAL_TABLET | Freq: Two times a day (BID) | ORAL | Status: DC
  Administered 2022-02-16 – 2022-02-17 (×2): 30 mg via ORAL
  Filled 2022-02-16 (×2): qty 1

## 2022-02-16 MED ORDER — ROSUVASTATIN CALCIUM 20 MG PO TABS
20.0000 mg | ORAL_TABLET | Freq: Every day | ORAL | Status: DC
Start: 2022-02-16 — End: 2022-02-17
  Administered 2022-02-17: 20 mg via ORAL
  Filled 2022-02-16: qty 1

## 2022-02-16 MED ORDER — DULOXETINE HCL 60 MG PO CPEP
60.0000 mg | ORAL_CAPSULE | Freq: Every day | ORAL | Status: DC
Start: 2022-02-16 — End: 2022-02-17
  Administered 2022-02-16 – 2022-02-17 (×2): 60 mg via ORAL
  Filled 2022-02-16 (×2): qty 1

## 2022-02-16 MED ORDER — ACETAMINOPHEN 500 MG PO TABS
1000.0000 mg | ORAL_TABLET | Freq: Once | ORAL | Status: DC
  Filled 2022-02-16: qty 2

## 2022-02-16 MED ORDER — ALPRAZOLAM 0.5 MG PO TABS
1.0000 mg | ORAL_TABLET | Freq: Once | ORAL | Status: AC
  Administered 2022-02-16: 1 mg via ORAL
  Filled 2022-02-16: qty 2

## 2022-02-16 MED ORDER — METOPROLOL SUCCINATE ER 100 MG PO TB24
100.0000 mg | ORAL_TABLET | Freq: Every day | ORAL | Status: DC
  Administered 2022-02-16 – 2022-02-17 (×2): 100 mg via ORAL
  Filled 2022-02-16 (×2): qty 1

## 2022-02-16 MED ORDER — OXYCODONE-ACETAMINOPHEN 10-325 MG PO TABS: 1.0000 | ORAL_TABLET | Freq: Four times a day (QID) | ORAL | Status: DC | PRN

## 2022-02-16 MED ORDER — OXYCODONE-ACETAMINOPHEN 5-325 MG PO TABS
2.0000 | ORAL_TABLET | Freq: Four times a day (QID) | ORAL | Status: DC | PRN
  Administered 2022-02-16 (×2): 2 via ORAL
  Filled 2022-02-16 (×2): qty 2

## 2022-02-16 MED ORDER — NITROGLYCERIN 0.4 MG SL SUBL: 0.4000 mg | SUBLINGUAL_TABLET | SUBLINGUAL | Status: DC | PRN

## 2022-02-16 MED ORDER — ALPRAZOLAM 1 MG PO TABS
1.0000 mg | ORAL_TABLET | Freq: Three times a day (TID) | ORAL | Status: DC | PRN
  Administered 2022-02-16 – 2022-02-17 (×2): 1 mg via ORAL
  Filled 2022-02-16 (×2): qty 1

## 2022-02-16 MED ORDER — MOMETASONE FURO-FORMOTEROL FUM 100-5 MCG/ACT IN AERO
2.0000 | INHALATION_SPRAY | Freq: Two times a day (BID) | RESPIRATORY_TRACT | Status: DC
Start: 2022-02-16 — End: 2022-02-17
  Administered 2022-02-16 – 2022-02-17 (×2): 2 via RESPIRATORY_TRACT
  Filled 2022-02-16: qty 8.8

## 2022-02-16 MED ORDER — TRAZODONE HCL 100 MG PO TABS
100.0000 mg | ORAL_TABLET | Freq: Every evening | ORAL | Status: DC | PRN
  Administered 2022-02-16: 100 mg via ORAL
  Filled 2022-02-16: qty 1

## 2022-02-16 NOTE — Progress Notes (Signed)
Carotid duplex has been completed.   Results can be found under chart review under CV PROC. 02/16/2022 5:45 PM Jayd Cadieux RVT, RDMS
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
## 2022-02-16 NOTE — ED Provider Notes (Signed)
Emergency Department Provider Note   I have reviewed the triage vital signs and the nursing notes.   HISTORY  Chief Complaint Fall   HPI Linda [name] is a 76 y.o. female with past history reviewed below presents to the emergency department for evaluation of bruising and pain around the right face, presumably after a fall.  She presents here with her husband who notes that she has history of frequent falls presumably due to "bad knees."  They both tell me that she falls frequently due to this but often falls backwards and once on the ground is unable to get herself up.  Today, was different, and that she does not recall the event at all.  She apparently either fell or bumped into something and then walked across the house to get into bed.  The husband returned home and found bruising around the right eye and patient has not been eating or drinking due to pain in that area.  She is not having any vision changes such as double vision or blurry vision.  She states that she does not recall most of the day today.  She is not having any chest pain but has noticed some mild shortness of breath.  No congestion or fever symptoms.  No abdominal discomfort, back pain, or numbness/weakness.   Past Medical History:  Diagnosis Date   Acute upper GI bleed 06/2014   while pt taking coumadin, plavix, and meloxicam---despite being told not to take coumadin.   Anginal pain ([place])    Nonobstructive CAD 2014; however, her cardiologist put her on a statin for this and NOT for hyperlipidemia per pt report.  Atyp CP 08/2017 at card f/u, plan for myoc perf imaging.   Anxiety    panic attacks   Asthma    w/ asbestososis    BPPV (benign paroxysmal positional vertigo) 12/16/2012   Chronic diastolic CHF (congestive heart failure) (HCC)    dry wt as of 11/06/16 is 168 lbs.   Chronic lower back pain    COPD (chronic obstructive pulmonary disease) (HCC)    DDD (degenerative disc disease)    lumbar and  cervical.    Diverticular disease    Fibromyalgia    Patient states dx was around her late 53s but she had sx's for years prior to this.   H/O hiatal hernia    History of pneumonia    hospitalized 12/2011, 02/2013, and 07/2013 [facility]) for this   HTN (hypertension)    Renal artery dopplers 04/2013 neg for stenosis.   Hypervitaminosis D 09/27/2019   over-supplemented.  Stopped vit D and plan recheck 2 mo.   Idiopathic angio-edema-urticaria 72014; 2021   Angioedema component was very minimal.  2021->Dr. Bobbitt (allergist) eval.   Insomnia    Iron deficiency anemia    Hematologist in [place], [place] did extensive w/u; no cause found; failed oral supplement;; gets fairly regular (q45m or so) IV iron infusions (Venofer -iron sucrose- 200mg  with procrit.  "for 14 yr I've been getting blood work q month & getting infusions prn" (07/12/2013).  Dr. Marin Olp locally, iron infusions done, EPO deficiency dx'd   Kidney stone    Migraine syndrome    "not as often anymore; used to be ~ q wk" (07/12/2013)   Mixed incontinence urge and stress    Nephrolithiasis    "passed all on my own or they are still in there" (07/12/2013)   Neuroleptic induced parkinsonism ([place]) 2018   Dr. Carles Collet, neuro, saw her 11/24/17 and  recommended d/c of abilify as first step.  D/c'd abilify and pt got complete recovery.   Oropharyngeal dysphagia    swallowing study speech path 05/2020. Gastric bx's showed gastritis, h pylori NEG   OSA on CPAP    prior to move to [place]--had another sleep study 10/2015 w/pulm Dr. Camillo Flaming.   Osteoarthritis    "severe; progressing fast" (07/12/2013); multiple joints-not surgical candidate for TKR (03/2015).  Triamcinolon knee injections by Dr. Tessa Lerner 12/2017.   Pernicious anemia 08/24/2014   Pleural plaque with presence of asbestos 07/22/2013   Pulmonary embolism ([place]) 07/2013   Dx at [facility] with very small peripheral upper lobe pe 07/2013: pt took coumadin for about 8-9 mo   Pyelonephritis    "several times over the  last 30 yr" (07/12/2013)   RBBB (right bundle branch block)    Recurrent major depression ([place])    Recurrent UTI    hx of hospitalization for pyelonephritis; started abx prophylaxis 06/2015   Syncope    Hypotensive; ED visit--Dr. Terrence Dupont did Cath--nonobstructive CAD, EF 55-60%.  In retrospect, suspect pt rx med misuse/polypharmacy    Review of Systems  Constitutional: No fever/chills Eyes: No visual changes. ENT: No sore throat. Cardiovascular: Denies chest pain. Respiratory: Denies shortness of breath. Gastrointestinal: No abdominal pain.  No nausea, no vomiting.  No diarrhea.  No constipation. Poor appetite.  Genitourinary: Negative for dysuria. Musculoskeletal: Negative for back pain. Skin: Positive bruising around the right eye.  Neurological: Negative for focal weakness or numbness. Positive HA. Positive amnesia to events of today.    ____________________________________________   PHYSICAL EXAM:  VITAL SIGNS: ED Triage Vitals  Enc Vitals Group     BP 02/15/22 2210 (!) 166/83     Pulse Rate 02/15/22 2210 73     Resp 02/15/22 2210 18     Temp 02/15/22 2210 98.2 F (36.8 C)     Temp src --      SpO2 02/15/22 2210 97 %    Constitutional: Alert and oriented. Well appearing and in no acute distress. Eyes: Conjunctivae are normal. PERRL. EOMI. Mild periorbital ecchymosis surrounding the right eye. No entrapment.  Head: Atraumatic. Ears:  Healthy appearing ear canals and TMs bilaterally.  No hemotympanum.  Nose: No congestion/rhinnorhea. Mouth/Throat: Mucous membranes are dry. Oropharynx non-erythematous. Neck: No stridor. No cervical spine tenderness to palpation. Cardiovascular: Normal rate, regular rhythm. Good peripheral circulation. Grossly normal heart sounds.   Respiratory: Normal respiratory effort.  No retractions. Lungs CTAB. Gastrointestinal: Soft and nontender. No distention.  Musculoskeletal: No lower extremity tenderness nor edema. No gross deformities of  extremities. Neurologic:  Normal speech and language. No gross focal neurologic deficits are appreciated. No facial asymmetry. 4+/5 strength throughout.  Skin:  Skin is warm, dry and intact. No rash noted.   ____________________________________________   LABS (all labs ordered are listed, but only abnormal results are displayed)  Labs Reviewed  BASIC METABOLIC PANEL - Abnormal; Notable for the following components:      Result Value   Potassium 2.7 (*)    Glucose, Bld 110 (*)    All other components within normal limits  URINALYSIS, ROUTINE W REFLEX MICROSCOPIC - Abnormal; Notable for the following components:   APPearance HAZY (*)    Hgb urine dipstick TRACE (*)    Bilirubin Urine SMALL (*)    Ketones, ur 15 (*)    Protein, ur 30 (*)    Leukocytes,Ua TRACE (*)    All other components within normal limits  URINALYSIS, MICROSCOPIC (REFLEX) -  Abnormal; Notable for the following components:   Bacteria, UA RARE (*)    All other components within normal limits  COMPREHENSIVE METABOLIC PANEL - Abnormal; Notable for the following components:   Potassium 3.2 (*)    Glucose, Bld 128 (*)    Calcium 8.8 (*)    All other components within normal limits  RESP PANEL BY RT-PCR (FLU A&B, COVID) ARPGX2  URINE CULTURE  CBC  MAGNESIUM  CBC  MAGNESIUM  PHOSPHORUS   ____________________________________________  EKG   EKG Interpretation  Date/Time:  Friday February 15 2022 22:25:06 EST Ventricular Rate:  73 PR Interval:  180 QRS Duration: 138 QT Interval:  428 QTC Calculation: 471 R Axis:   7 Text Interpretation: Normal sinus rhythm Right bundle branch block Abnormal ECG When compared with ECG of 24-Nov-2020 17:02, PREVIOUS ECG IS PRESENT Similar to prior Confirmed by Nanda Quinton [phone]) on 02/16/2022 3:13:11 AM        ____________________________________________  RADIOLOGY  DG Chest 2 View  Result Date: 02/16/2022 CLINICAL DATA:  Shortness of breath EXAM: CHEST - 2 VIEW  COMPARISON:  11/23/2020 FINDINGS: Bilateral calcified pleural plaques again noted, unchanged. Heart is normal size. No acute confluent airspace opacities or effusions. No acute bony abnormality. Aortic atherosclerosis. IMPRESSION: No acute cardiopulmonary disease. Calcified pleural plaques. Electronically Signed   By: Rolm Baptise M.D.   On: 02/16/2022 02:48   CT HEAD WO CONTRAST  Result Date: 02/15/2022 CLINICAL DATA:  Head trauma, minor (Age >= 65y).  Multiple falls. EXAM: CT HEAD WITHOUT CONTRAST TECHNIQUE: Contiguous axial images were obtained from the base of the skull through the vertex without intravenous contrast. RADIATION DOSE REDUCTION: This exam was performed according to the departmental dose-optimization program which includes automated exposure control, adjustment of the mA and/or kV according to patient size and/or use of iterative reconstruction technique. COMPARISON:  None. FINDINGS: Brain: Mild age related volume loss. No acute intracranial abnormality. Specifically, no hemorrhage, hydrocephalus, mass lesion, acute infarction, or significant intracranial injury. Vascular: No hyperdense vessel or unexpected calcification. Skull: No acute calvarial abnormality. Sinuses/Orbits: Near complete opacification of the right maxillary sinus and scattered right ethmoid air cells. Other: None IMPRESSION: No acute intracranial abnormality. Fluid/near complete opacification of the right maxillary sinus and scattered right ethmoid air cells. Electronically Signed   By: Rolm Baptise M.D.   On: 02/15/2022 23:11   CT Cervical Spine Wo Contrast  Result Date: 02/15/2022 CLINICAL DATA:  Multiple falls.  Neck trauma (Age >= 65y) EXAM: CT CERVICAL SPINE WITHOUT CONTRAST TECHNIQUE: Multidetector CT imaging of the cervical spine was performed without intravenous contrast. Multiplanar CT image reconstructions were also generated. RADIATION DOSE REDUCTION: This exam was performed according to the departmental  dose-optimization program which includes automated exposure control, adjustment of the mA and/or kV according to patient size and/or use of iterative reconstruction technique. COMPARISON:  07/14/2021 FINDINGS: Alignment: No subluxation Skull base and vertebrae: No acute fracture. No primary bone lesion or focal pathologic process. Soft tissues and spinal canal: No prevertebral fluid or swelling. No visible canal hematoma. Disc levels: Diffuse moderate to advanced degenerative disc disease and facet disease. Upper chest: No acute findings Other: None IMPRESSION: Degenerative disc and facet disease. No acute bony abnormality. Electronically Signed   By: Rolm Baptise M.D.   On: 02/15/2022 23:12   CT Maxillofacial Wo Contrast  Result Date: 02/15/2022 CLINICAL DATA:  Facial trauma, blunt.  Multiple falls. EXAM: CT MAXILLOFACIAL WITHOUT CONTRAST TECHNIQUE: Multidetector CT imaging of the maxillofacial structures was performed.  Multiplanar CT image reconstructions were also generated. RADIATION DOSE REDUCTION: This exam was performed according to the departmental dose-optimization program which includes automated exposure control, adjustment of the mA and/or kV according to patient size and/or use of iterative reconstruction technique. COMPARISON:  None. FINDINGS: Osseous: Fracture noted through the inferior anterior wall of the right maxillary sinus. Orbits: Mildly depressed fracture noted through the floor of the right orbit. No muscular entrapment. Sinuses: Blood within the right maxillary sinus and scattered right ethmoid air cells. Soft tissues: Negative Limited intracranial: See head CT report IMPRESSION: Fracture through the anterior inferior wall of the right maxillary sinus. Fracture through the right orbital floor with mild depression. Electronically Signed   By: Rolm Baptise M.D.   On: 02/15/2022 23:15    ____________________________________________   PROCEDURES  Procedure(s) performed:    Procedures  CRITICAL CARE Performed by: Margette Fast Total critical care time: 35 minutes Critical care time was exclusive of separately billable procedures and treating other patients. Critical care was necessary to treat or prevent imminent or life-threatening deterioration. Critical care was time spent personally by me on the following activities: development of treatment plan with patient and/or surrogate as well as nursing, discussions with consultants, evaluation of patient's response to treatment, examination of patient, obtaining history from patient or surrogate, ordering and performing treatments and interventions, ordering and review of laboratory studies, ordering and review of radiographic studies, pulse oximetry and re-evaluation of patient's condition.  Nanda Quinton, MD Emergency Medicine  ____________________________________________   INITIAL IMPRESSION / ASSESSMENT AND PLAN / ED COURSE  Pertinent labs & imaging results that were available during my care of the patient were reviewed by me and considered in my medical decision making (see chart for details).   This patient is Presenting for Evaluation of AMS, which does require a range of treatment options, and is a complaint that involves a high risk of morbidity and mortality.  The Differential Diagnoses includes but is not exclusive to alcohol, illicit or prescription medications, intracranial pathology such as stroke, intracerebral hemorrhage, fever or infectious causes including sepsis, hypoxemia, uremia, trauma, endocrine related disorders such as diabetes, hypoglycemia, thyroid-related diseases, etc.   Critical Interventions-    Medications  acetaminophen (TYLENOL) tablet 1,000 mg (0 mg Oral Hold 02/16/22 0639)  0.9 %  sodium chloride infusion ( Intravenous Stopped 02/16/22 0654)  ALPRAZolam (XANAX) tablet 1 mg (1 mg Oral Given 02/16/22 2134)  aspirin EC tablet 81 mg (81 mg Oral Given 02/16/22 2123)   mometasone-formoterol (DULERA) 100-5 MCG/ACT inhaler 2 puff (2 puffs Inhalation Given 02/16/22 2044)  donepezil (ARICEPT) tablet 10 mg (10 mg Oral Given 02/16/22 2124)  isosorbide mononitrate (IMDUR) 24 hr tablet 30 mg (30 mg Oral Given 02/16/22 2123)  lamoTRIgine (LAMICTAL) tablet 150 mg (150 mg Oral Given 02/16/22 1102)  pantoprazole (PROTONIX) EC tablet 40 mg (40 mg Oral Given 02/16/22 2123)  rosuvastatin (CRESTOR) tablet 20 mg (20 mg Oral Not Given 02/16/22 1103)  oxybutynin (DITROPAN) tablet 5 mg (5 mg Oral Given 02/16/22 2124)  nitroGLYCERIN (NITROSTAT) SL tablet 0.4 mg (has no administration in time range)  metoprolol succinate (TOPROL-XL) 24 hr tablet 100 mg (100 mg Oral Given 02/16/22 1528)  DULoxetine (CYMBALTA) DR capsule 60 mg (60 mg Oral Given 02/16/22 1529)  DULoxetine (CYMBALTA) DR capsule 30 mg (30 mg Oral Given 02/16/22 1528)  albuterol (PROVENTIL) (2.5 MG/3ML) 0.083% nebulizer solution 2.5 mg (has no administration in time range)  oxyCODONE-acetaminophen (PERCOCET/ROXICET) 5-325 MG per tablet 1 tablet (1 tablet  Oral Given 02/17/22 0503)    And  oxyCODONE (Oxy IR/ROXICODONE) immediate release tablet 5 mg (5 mg Oral Given 02/16/22 1853)  traZODone (DESYREL) tablet 150 mg (has no administration in time range)  potassium chloride 10 mEq in 100 mL IVPB (0 mEq Intravenous Stopped 02/16/22 0651)  potassium chloride SA (KLOR-CON M) CR tablet 40 mEq (40 mEq Oral Given 02/16/22 0408)  ALPRAZolam (XANAX) tablet 1 mg (1 mg Oral Given 02/16/22 0333)  potassium chloride SA (KLOR-CON M) CR tablet 40 mEq (40 mEq Oral Given 02/16/22 1533)  magnesium sulfate IVPB 2 g 50 mL (2 g Intravenous New Bag/Given 02/16/22 1534)    Reassessment after intervention:  patient with improved pain.    I did obtain Additional Historical Information from SO at bedside.  I decided to review pertinent External Data, and in summary no recent ED visits for similar. Last PCP note in January of 2023.   Clinical Laboratory  Tests Ordered, included basic metabolic panel with potassium of 2.7.  Magnesium within normal limits.  Trace leukocytes and rare bacteria on UA.  Will send for culture as patient is not having symptoms.  CBC within normal limits.   Radiologic Tests Ordered, included CT head, max/face, and c-spine. I independently interpreted the images and agree with radiology interpretation.   Cardiac Monitor Tracing which shows NSR   Social Determinants of Health Risk patient is a non-smoker.   Consult complete with Hospitalist  Discussed patient's case with [facility] to request admission. Patient and family (if present) updated with plan. Care transferred to [facility] service.   Medical Decision Making: Summary:  Patient presents emergency department for evaluation of injury to the right face but amnesia to the event.  Unknown if this represents a syncope event versus seizure versus stroke.  Patient does have frequent falls but this pattern seems different than her prior episodes.  I do not have any focal neurodeficit.  She has some mild bruising to the face but no malocclusion, clinical signs of entrapment, proptosis.  Advised nose blowing and open mouth sneezing precaution.  Will ultimately need outpatient ENT follow-up. Will replace K+ here.   Reevaluation with update and discussion with patient and SO at bedside. Agree with plan for admit.   Disposition: admit  ____________________________________________  FINAL CLINICAL IMPRESSION(S) / ED DIAGNOSES  Final diagnoses:  Transient alteration of awareness  Closed fracture of right orbital floor, initial encounter ([place])  Hypokalemia  Generalized weakness     Note:  This document was prepared using Dragon voice recognition software and may include unintentional dictation errors.  Nanda Quinton, MD, [facility] Emergency Medicine    Alonnie Bieker, [facility], MD 02/17/22 [phone]

## 2022-02-16 NOTE — H&P (Signed)
History and Physical    Patient: Linda [name] PZW:[id number] DOB: 1946-01-18 DOA: 02/16/2022 DOS: the patient was seen and examined on 02/16/2022 PCP: Tammi Sou, MD  Patient coming from: ALF/ILF  Chief Complaint:  Chief Complaint  Patient presents with   Fall   HPI: Linda [name] is a 76 y.o. female with medical history significant of upper GI bleed, nonobstructive CAD, anxiety, panic attacks, recurrent depressive disorder, asthma, BPPV, chronic diastolic CHF, chronic lower back pain, COPD, DDD, diverticular disease, fibromyalgia, GERD, hiatal hernia, history of pneumonia, hypertension, hypervitaminosis D due to oversupplementation [employer] angioedema aortic area, insomnia, iron deficiency anemia, migraine headaches, mixed incontinence, nephrolithiasis neuroleptic induced Parkinson's disease, oropharyngeal dysphagia, OSA on CPAP, osteoarthritis, pernicious anemia, pleural plaque due to asbestosis history of PE, recurrent UTI, history of pyelonephritis, syncopal episode who is coming to the emergency department due to having a fall at her assisted living facility for which she does not have any recollection of the event.  She hit herself in her to her right periorbital area.  She has been nauseous afterwards, but denied vomiting. No prodromal symptoms like chest pain, palpitations, diaphoresis or dizziness. She has had at least 5 falls in the past 2 weeks. She stated that her legs and knees are very weak and when she falls she feels like her knees are giving out.  Her appetite is decreased, but denied diarrhea, constipation, melena or hematochezia.  No flank pain, dysuria, but she gets frequent incontinence.  No polyuria, polydipsia, polyphagia or blurred vision.  Lab work: Initial vital signs were temperature 98.2 F, pulse 73, respiration 18, BP 166/83 mmHg and O2 sat 97% on room.  She received alprazolam 1 mg p.o. x1 and K-Dur 40 mEq p.o. x1.  Lab work: Her urinalysis was  hazy with trace hemoglobinuria small bilirubin pneumonia and trace leukocyte esterase.  There was ketonuria of 15 and proteinuria 30 mg/dL.  Rare bacteria on urine microscopic examination.  Her CBC was normal.  BMP showed a potassium of 2.7 mmol/L and a glucose of 110 mg/dL.  Imaging: 2 view chest radiograph show calcified pleural plaques.  CT head with no acute intracranial abnormality.  C-spine CT with DDD and facet disease but no acute abnormality.  CT maxillofacial without contrast show a fracture through the anterior inferior wall of the right maxillary sinus.  Fracture to the right orbital floor with mild depression.  Please see images and full radiology report for further details.  Review of Systems: As mentioned in the history of present illness. All other systems reviewed and are negative. Past Medical History:  Diagnosis Date   Acute upper GI bleed 06/2014   while pt taking coumadin, plavix, and meloxicam---despite being told not to take coumadin.   Anginal pain ([place])    Nonobstructive CAD 2014; however, her cardiologist put her on a statin for this and NOT for hyperlipidemia per pt report.  Atyp CP 08/2017 at card f/u, plan for myoc perf imaging.   Anxiety    panic attacks   Asthma    w/ asbestososis    BPPV (benign paroxysmal positional vertigo) 12/16/2012   Chronic diastolic CHF (congestive heart failure) (HCC)    dry wt as of 11/06/16 is 168 lbs.   Chronic lower back pain    COPD (chronic obstructive pulmonary disease) (HCC)    DDD (degenerative disc disease)    lumbar and cervical.    Diverticular disease    Fibromyalgia    Patient states dx was around  her late 18s but she had sx's for years prior to this.   H/O hiatal hernia    History of pneumonia    hospitalized 12/2011, 02/2013, and 07/2013 [facility]) for this   HTN (hypertension)    Renal artery dopplers 04/2013 neg for stenosis.   Hypervitaminosis D 09/27/2019   over-supplemented.  Stopped vit D and plan recheck 2 mo.    Idiopathic angio-edema-urticaria 72014; 2021   Angioedema component was very minimal.  2021->Dr. Bobbitt (allergist) eval.   Insomnia    Iron deficiency anemia    Hematologist in [place], [place] did extensive w/u; no cause found; failed oral supplement;; gets fairly regular (q42m or so) IV iron infusions (Venofer -iron sucrose- 200mg  with procrit.  "for 14 yr I've been getting blood work q month & getting infusions prn" (07/12/2013).  Dr. Marin Olp locally, iron infusions done, EPO deficiency dx'd   Kidney stone    Migraine syndrome    "not as often anymore; used to be ~ q wk" (07/12/2013)   Mixed incontinence urge and stress    Nephrolithiasis    "passed all on my own or they are still in there" (07/12/2013)   Neuroleptic induced parkinsonism (HCC) 2018   Dr. Carles Collet, neuro, saw her 11/24/17 and recommended d/c of abilify as first step.  D/c'd abilify and pt got complete recovery.   Oropharyngeal dysphagia    swallowing study speech path 05/2020. Gastric bx's showed gastritis, h pylori NEG   OSA on CPAP    prior to move to [place]--had another sleep study 10/2015 w/pulm Dr. Camillo Flaming.   Osteoarthritis    "severe; progressing fast" (07/12/2013); multiple joints-not surgical candidate for TKR (03/2015).  Triamcinolon knee injections by Dr. Tessa Lerner 12/2017.   Pernicious anemia 08/24/2014   Pleural plaque with presence of asbestos 07/22/2013   Pulmonary embolism ([place]) 07/2013   Dx at [facility] with very small peripheral upper lobe pe 07/2013: pt took coumadin for about 8-9 mo   Pyelonephritis    "several times over the last 30 yr" (07/12/2013)   RBBB (right bundle branch block)    Recurrent major depression ([place])    Recurrent UTI    hx of hospitalization for pyelonephritis; started abx prophylaxis 06/2015   Syncope    Hypotensive; ED visit--Dr. Terrence Dupont did Cath--nonobstructive CAD, EF 55-60%.  In retrospect, suspect pt rx med misuse/polypharmacy   Past Surgical History:  Procedure Laterality Date   APPENDECTOMY  1960    AXILLARY SURGERY Left 1978   Multiple "lump" in armpit per pt   BIOPSY  06/17/2020   Gastric bx->gastritis, h pylori neg.  Procedure: BIOPSY;  Surgeon: Carol Ada, MD;  Location: [facility];  Service: Endoscopy;;   CARDIAC CATHETERIZATION  01/2013   nonobstructive CAD, EF 55-60%   CARDIOVASCULAR STRESS TEST  02/22/15   Low risk myocard perf imaging; wall motion normal, normal EF   carotid duplex doppler  10/21/2017   R vertebral flow suggestive of possible distal obstruction.  Pt declined further w/u as of 10/29/17 but need to revisit this problem periodically.   COCCYX REMOVAL  1972   DEXA  06/05/2017   T-score -3.1   DILATION AND CURETTAGE OF UTERUS  ? 1970's   ESOPHAGOGASTRODUODENOSCOPY N/A 07/19/2014   Gastritis found + in the setting of supratherapeutic INR, +plavix, + meloxicam.   ESOPHAGOGASTRODUODENOSCOPY (EGD) WITH PROPOFOL N/A 06/17/2020   NO stricture or other prob to explain pt's dysphagia, dilation was done anyway.  Gastric bx's-->gastritis, h pylori neg. Procedure: ESOPHAGOGASTRODUODENOSCOPY (EGD) WITH PROPOFOL;  Surgeon: Carol Ada, MD;  Location: [facility];  Service: Endoscopy;  Laterality: N/A;   EYE SURGERY Left 2012-2013   "injections for ~ 1 yr; don't really know what for" (07/12/2013)   HEEL SPUR SURGERY Left 2008   KNEE SURGERY  2005   LEFT HEART CATHETERIZATION WITH CORONARY ANGIOGRAM N/A 01/30/2013   Procedure: LEFT HEART CATHETERIZATION WITH CORONARY ANGIOGRAM;  Surgeon: Clent Demark, MD;  Location: [facility];  Service: Cardiovascular;  Laterality: N/A;   MALONEY DILATION  06/17/2020   Procedure: Venia Minks DILATION;  Surgeon: Carol Ada, MD;  Location: [facility];  Service: Endoscopy;;   PLANTAR FASCIA RELEASE Left 2008   SPIROMETRY  04/25/14   In hosp for acute asthma/COPD flare: mixed obstructive and restrictive lung disease. The FEV1 is severely reduced at 45% predicted.  FEV1 signif decreased compared to prior spirometry 07/23/13.   TENDON RELEASE   1996   Right forearm and hand   TOTAL ABDOMINAL HYSTERECTOMY  1974   TRANSTHORACIC ECHOCARDIOGRAM  01/2013; 04/2014;08/2015; 09/2017   2014--NORMAL.  2015--focal basal septal hypertrophy, EF 55-60%, grade I diast dysfxn, mild LAE.  08/2015 EF 55-60%, nl LV syst fxn, grade I DD, valves wnl. 10/21/17: EF 65-70%, grd I DD, o/w normal.   Social History:  reports that she has never smoked. She has never used smokeless tobacco. She reports that she does not drink alcohol and does not use drugs.  Allergies  Allergen Reactions   Abilify [Aripiprazole] Other (See Comments)    parkinsonism   Bactrim [Sulfamethoxazole-Trimethoprim] Rash   Penicillins Itching, Swelling and Rash    Tolerated Cefepime in ED. Has patient had a PCN reaction causing immediate rash, facial/tongue/throat swelling, SOB or lightheadedness with hypotension: Yes Has patient had a PCN reaction causing severe rash involving mucus membranes or skin necrosis: No Has patient had a PCN reaction that required hospitalization: No  Has patient had a PCN reaction occurring within the last 10 years: No     Family History  Problem Relation Age of Onset   Arthritis Mother    Kidney disease Mother    Heart disease Father    Stroke Father    Hypertension Father    Diabetes Father    Heart attack Father    Heart attack Paternal Grandmother    Diabetes Sister        one sister   Hypertension Sister    Asthma Sister    Hypertension Brother    Asthma Brother    Asthma Daughter    Multiple sclerosis Son     Prior to Admission medications   Medication Sig Start Date End Date Taking? Authorizing Provider  albuterol (VENTOLIN HFA) 108 (90 Base) MCG/ACT inhaler Inhale 1-2 puffs into the lungs every 6 (six) hours as needed for wheezing or shortness of breath. 12/28/20  Yes McGowen, Adrian Blackwater, MD  ALPRAZolam Duanne Moron) 1 MG tablet TAKE 1 TABLET THREE TIMES DAILY AS NEEDED FOR ANXIETY. 10/11/21  Yes McGowen, Adrian Blackwater, MD  aspirin 81 MG tablet  Take 81 mg by mouth at bedtime.   Yes [provider]  cyclobenzaprine (FLEXERIL) 5 MG tablet Take 1 tablet (5 mg total) by mouth 2 (two) times daily as needed for muscle spasms. 01/17/22  Yes Meredith Staggers, MD  diclofenac Sodium (VOLTAREN) 1 % GEL Apply 2 g topically 4 (four) times daily. 4 times a day to both knees 05/11/21  Yes Danella Sensing L, NP  DULoxetine (CYMBALTA) 30 MG capsule TAKE 1 CAPSULE ONCE DAILY  ALONG WITH 60MG  CAPSULE FOR TOTAL 90MG  DAILY. 01/23/22  Yes McGowen, Adrian Blackwater, MD  DULoxetine (CYMBALTA) 60 MG capsule TAKE 1 CAPSULE A DAY TO BE COMBINED WITH 30MG  CAPSULE 01/23/22  Yes McGowen, Adrian Blackwater, MD  isosorbide mononitrate (IMDUR) 30 MG 24 hr tablet TAKE 2 TABLETS IN THE AM AND 1 TABLET IN THE PM. 10/16/21  Yes Crenshaw, Denice Bors, MD  lamoTRIgine (LAMICTAL) 150 MG tablet TAKE 1 TABLET EACH DAY. 11/12/21  Yes McGowen, Adrian Blackwater, MD  metoprolol succinate (TOPROL-XL) 100 MG 24 hr tablet take ONE tab daily, take additional 1/2 tab as needed FOR FOR BLOOD PRESSURE > 160/90 12/12/21  Yes McGowen, Adrian Blackwater, MD  Multiple Vitamin (MULTIVITAMIN WITH MINERALS) TABS tablet Take 1 tablet by mouth daily.   Yes [provider]  ondansetron (ZOFRAN) 8 MG tablet Take 1 tablet (8 mg total) by mouth every 8 (eight) hours as needed for nausea or vomiting. 09/29/20  Yes McGowen, Adrian Blackwater, MD  oxybutynin (DITROPAN) 5 MG tablet Take 1 tablet (5 mg total) by mouth 3 (three) times daily. 01/11/22  Yes McGowen, Adrian Blackwater, MD  oxyCODONE-acetaminophen (PERCOCET) 10-325 MG tablet Take 1 tablet by mouth every 6 (six) hours as needed for pain. 02/08/22  Yes Bayard Hugger, NP  pantoprazole (PROTONIX) 40 MG tablet TAKE 1 TABLET BY MOUTH TWICE DAILY. 10/16/21  Yes McGowen, Adrian Blackwater, MD  rosuvastatin (CRESTOR) 20 MG tablet Take 1 tablet (20 mg total) by mouth daily. 10/16/21  Yes Lelon Perla, MD  traZODone (DESYREL) 50 MG tablet TAKE 2-4 TABLETS AT BEDTIME 10/01/21  Yes McGowen, Adrian Blackwater, MD   alendronate (FOSAMAX) 70 MG tablet Take 1 tablet (70 mg total) by mouth every 7 (seven) days. Take with a full glass of water on an empty stomach, first thing in the morning and remain up right for 30 minutes after taking. 07/26/19   McGowen, Adrian Blackwater, MD  budesonide-formoterol (SYMBICORT) 80-4.5 MCG/ACT inhaler 2 puffs    [provider]  docusate sodium (COLACE) 100 MG capsule Take 1 capsule (100 mg total) by mouth 2 (two) times daily. 06/20/20   Alma Friendly, MD  donepezil (ARICEPT) 10 MG tablet Take 1 tablet (10 mg total) by mouth at bedtime. must have office visit 05/16/21   Tammi Sou, MD  EPINEPHrine 0.3 mg/0.3 mL IJ SOAJ injection Inject 0.3 mg into the muscle as needed for anaphylaxis. 12/28/20   McGowen, Adrian Blackwater, MD  fluconazole (DIFLUCAN) 100 MG tablet TAKE (1) TABLET DAILY AS NEEDED FOR THRUSH 12/31/21   McGowen, Adrian Blackwater, MD  fluticasone (FLONASE) 50 MCG/ACT nasal spray USE 2 SPRAYS IN EACH NOSTRIL ONCE DAILY 02/02/21   McGowen, Adrian Blackwater, MD  furosemide (LASIX) 40 MG tablet Take 1 tablet (40 mg total) by mouth daily as needed for fluid. 10/16/21   Lelon Perla, MD  ibuprofen (ADVIL) 600 MG tablet Take 1 tablet (600 mg total) by mouth every 6 (six) hours as needed. 07/14/21   Charlann Lange, PA-C  ipratropium (ATROVENT) 0.03 % nasal spray Place 2 sprays into both nostrils every 12 (twelve) hours. 12/26/21   McGowen, Adrian Blackwater, MD  levofloxacin (LEVAQUIN) 500 MG tablet Take 500 mg by mouth daily. 11/26/21   [provider]  lidocaine (LIDODERM) 5 % Place 1 patch onto the skin daily. Remove & Discard patch within 12 hours or as directed by MD 07/14/21   Charlann Lange, PA-C  metroNIDAZOLE (METROGEL) 0.75 % gel Apply 1 application topically at  bedtime. 11/06/20   [provider]  mupirocin ointment (BACTROBAN) 2 % Apply 1 application topically 2 (two) times daily. Can be ointment or cream- whichever on formulary and cheapest for her 10/20/20   Raoul Pitch, Renee A,  DO  nitroGLYCERIN (NITROSTAT) 0.4 MG SL tablet Place 1 tablet (0.4 mg total) under the tongue every 5 (five) minutes as needed for chest pain (x 3 doses). 10/16/21   Lelon Perla, MD    Physical Exam: Vitals:   02/16/22 0835 02/16/22 1153 02/16/22 1325 02/16/22 1456  BP: (!) 127/105 (!) 165/95 (!) 159/78   Pulse: 66 77 79   Resp: 16 16  15   Temp:  97.6 F (36.4 C) 98.2 F (36.8 C)   TempSrc:  Oral Oral   SpO2: 94% 100% 98%    Physical Exam Constitutional:      Appearance: She is normal weight.  HENT:     Head: Normocephalic. Right periorbital erythema present.     Comments:   Eyes:     Extraocular Movements: Extraocular movements intact.     Pupils: Pupils are equal, round, and reactive to light.  Cardiovascular:     Rate and Rhythm: Normal rate and regular rhythm.  Pulmonary:     Effort: Pulmonary effort is normal.     Breath sounds: Normal breath sounds.  Abdominal:     General: Abdomen is flat. There is no distension.     Palpations: Abdomen is soft.     Tenderness: There is no abdominal tenderness.  Musculoskeletal:     Cervical back: Neck supple.     Right lower leg: No edema.     Left lower leg: No edema.  Skin:    Findings: Bruising present.  Neurological:     General: No focal deficit present.     Mental Status: She is alert and oriented to person, place, and time.  Psychiatric:        Mood and Affect: Mood normal.   Data Reviewed:  There are no new results to review at this time.  Assessment and Plan: Principal problem:   Fall Secondary to:   Syncope Resulting in:   Maxillary fracture ([place])   Right orbital fracture Observation/telemetry. Fall precautions. Analgesics as needed. Antiemetics as needed. Check carotid Doppler. Check echocardiogram. Consult physical therapy in AM. Will consult ENT on-call.  Active Problems:   Anxiety and depression Continue alprazolam as needed. Continue duloxetine 90 mg p.o. twice daily. Continue  lamotrigine 150 mg p.o. daily. Continue donezepil 10 mg p.o. at bedtime. Follow-up with PCP and behavioral health.    HTN (hypertension), benign Continue metoprolol ER 100 mg p.o. daily. Monitor heart rate and blood pressure.    OSA (obstructive sleep apnea) Unable to use mask given maxillofacial/orbital fracture.    Chronic diastolic CHF, NYHA class 1 No signs of decompensation.    Hypokalemia Replacing. Magnesium was supplemented. Follow-up potassium level.    Hyperlipidemia Continue rosuvastatin 20 mg p.o. daily.    CAD Nonobstructive. Continue ASA, beta-blocker, SR nitrates and statin.    COPD (chronic obstructive pulmonary disease) (HCC) Continue Symbicort twice daily or formulary equivalent. Supplemental oxygen and SABA as needed   Advance Care Planning:   Code Status: Full Code   Consults: ENT Melissa Montane, MD)  PT and [facility] team.  Family Communication:   Severity of Illness: The appropriate patient status for this patient is OBSERVATION. Observation status is judged to be reasonable and necessary in order to provide the required intensity of service to  ensure the patient's safety. The patient's presenting symptoms, physical exam findings, and initial radiographic and laboratory data in the context of their medical condition is felt to place them at decreased risk for further clinical deterioration. Furthermore, it is anticipated that the patient will be medically stable for discharge from the hospital within 2 midnights of admission.   Author: Reubin Milan, MD 02/16/2022 5:56 PM  For on call review [email or web address].   This document was prepared using [employer] and may contain some unintended transcription errors.

## 2022-02-16 NOTE — Plan of Care (Signed)
  Problem: Education: Goal: Knowledge of General Education information will improve Description: Including pain rating scale, medication(s)/side effects and non-pharmacologic comfort measures Outcome: Progressing   Problem: Clinical Measurements: Goal: Will remain free from infection Outcome: Progressing   

## 2022-02-16 NOTE — ED Notes (Signed)
[facility] transporting pt

## 2022-02-17 ENCOUNTER — Observation Stay (HOSPITAL_BASED_OUTPATIENT_CLINIC_OR_DEPARTMENT_OTHER): Payer: Medicare Other

## 2022-02-17 DIAGNOSIS — R296 Repeated falls: Secondary | ICD-10-CM | POA: Diagnosis not present

## 2022-02-17 DIAGNOSIS — S0231XA Fracture of orbital floor, right side, initial encounter for closed fracture: Secondary | ICD-10-CM | POA: Diagnosis not present

## 2022-02-17 DIAGNOSIS — G4733 Obstructive sleep apnea (adult) (pediatric): Secondary | ICD-10-CM

## 2022-02-17 DIAGNOSIS — I5032 Chronic diastolic (congestive) heart failure: Secondary | ICD-10-CM

## 2022-02-17 DIAGNOSIS — R55 Syncope and collapse: Secondary | ICD-10-CM

## 2022-02-17 DIAGNOSIS — S0240CA Maxillary fracture, right side, initial encounter for closed fracture: Secondary | ICD-10-CM | POA: Diagnosis not present

## 2022-02-17 DIAGNOSIS — S0285XA Fracture of orbit, unspecified, initial encounter for closed fracture: Secondary | ICD-10-CM | POA: Diagnosis not present

## 2022-02-17 DIAGNOSIS — W19XXXA Unspecified fall, initial encounter: Secondary | ICD-10-CM | POA: Diagnosis not present

## 2022-02-17 LAB — ECHOCARDIOGRAM COMPLETE
Area-P 1/2: 2.03 cm2
S' Lateral: 3.3 cm

## 2022-02-17 LAB — URINE CULTURE

## 2022-02-17 MED ORDER — ROSUVASTATIN CALCIUM 20 MG PO TABS: 20.0000 mg | ORAL_TABLET | Freq: Every evening | ORAL | Status: DC

## 2022-02-17 MED ORDER — METHOCARBAMOL 500 MG PO TABS
500.0000 mg | ORAL_TABLET | Freq: Four times a day (QID) | ORAL | 0 refills | Status: DC | PRN
Start: 2022-02-17 — End: 2022-10-09

## 2022-02-17 MED ORDER — METHOCARBAMOL 500 MG PO TABS
500.0000 mg | ORAL_TABLET | Freq: Four times a day (QID) | ORAL | Status: DC | PRN
  Administered 2022-02-17: 500 mg via ORAL
  Filled 2022-02-17: qty 1

## 2022-02-17 MED ORDER — ISOSORBIDE MONONITRATE ER 30 MG PO TB24: 60.0000 mg | ORAL_TABLET | Freq: Every day | ORAL | Status: DC

## 2022-02-17 MED ORDER — IPRATROPIUM BROMIDE 0.03 % NA SOLN: 2.0000 | Freq: Two times a day (BID) | NASAL | Status: DC | PRN

## 2022-02-17 NOTE — TOC Progression Note (Signed)
Transition of Care [facility]) - Progression Note    Patient Details  Name: DJUNA FRECHETTE MRN: [id number] Date of Birth: Jul 30, 1946  Transition of Care [facility] CM/SW Contact  Ross Ludwig, [place] Phone Number: 02/17/2022, 11:26 AM  Clinical Narrative:     Patient is from [facility].  Plan to return back to IL[facility], CSW to follow in case any social work needs arise.        Expected Discharge Plan and Services                                                 Social Determinants of Health (SDOH) Interventions    Readmission Risk Interventions No flowsheet data found.

## 2022-02-17 NOTE — TOC Transition Note (Signed)
Transition of Care [facility]) - CM/SW Discharge Note   Patient Details  Name: Linda [name] MRN: [id number] Date of Birth: August 11, 1946  Transition of Care [facility]) CM/SW Contact:  Ross Ludwig, LCSW Phone Number: 02/17/2022, 1:03 PM   Clinical Narrative:     CSW spoke to patient and asked if she need any equipment, she said no she has a rollator at home already.  CSW also asked if she is interested in [facility] PT, and she said yes.  [facility] has a Soil scientist with [employer], CSW sent message to [place] to inform her that patient will need HH PT and is discharging today.  CSW faxed DC summary, PT notes, and HH orders to [employer].  CSW signing off, please reconsult if other social work needs arise.   Final next level of care: [place] Barriers to Discharge: Barriers Resolved   Patient Goals and CMS Choice Patient states their goals for this hospitalization and ongoing recovery are:: To return back to [place] with [facility] PT through [place]. CMS Medicare.gov Compare Post Acute Care list provided to:: Patient Choice offered to / list presented to : Patient  Discharge Placement                       Discharge Plan and Services                          HH Arranged: PT [facility] Agency: Other - See comment Secondary school teacher) Date [facility] Agency Contacted: 02/17/22 Time [place]: [id number] Representative spoke with at [place]: [place] ([place]) Interventions     Readmission Risk Interventions No flowsheet data found.

## 2022-02-17 NOTE — Discharge Summary (Signed)
Physician Discharge Summary   Patient: Linda [name] MRN: [id number] DOB: [birth year]  Admit date:     02/16/2022  Discharge date: 02/17/22  Discharge Physician: Aline August   PCP: Tammi Sou, MD   Recommendations at discharge:   Follow-up with PCP within a week Outpatient follow-up with cardiology Follow-up in the ED if symptoms worsen or new.  Hospital Course: Generalized-year-old female with history of GI bleed, nonobstructive CAD, anxiety, panic attacks, depression, asthma, BPPV, chronic diastolic CHF, chronic lower back pain, COPD, [name], diverticular disease, fibromyalgia, GERD, hypertension, recurrent UTI, pleural plaque due to asbestosis, OSA on CPAP, osteoarthritis presented with fall/syncopal episode.  Patient did not have any recollection of the event.  She hit herself in her right periorbital area.  On presentation, vitals were stable.  Blood work was almost unremarkable.  CT of the head was negative for any acute intracranial abnormity.  CT C-spine showed DDD and facet disease but no acute abnormality.  CT maxillofacial without contrast showed a fracture through the anterior inferior wall of the right maxillary sinus, fracture to the right orbital floor with mild depression.  During the hospitalization, carotid ultrasound and 2D echo were unremarkable.  ENT evaluated the patient and recommended conservative management with no further ENT follow-up and has cleared the patient for discharge.  Tolerated PT well.  She will be discharged back to independent living facility today.  Assessment and Plan: Fall- (present on admission) Syncope Maxillary fracture Right orbital fracture -Presented with fall/syncope resulting in maxillary and right orbital fracture.  Imaging studies as above. -Carotid duplex showed no significant carotid stenosis.  Right ICA stenosis of 40 to 59% and left ICA stenosis of 1 to 39%.  Echo showed EF of 70 to 75% with grade 1 diastolic dysfunction. -ENT  evaluated the patient and recommended conservative management with no further ENT follow-up and has cleared the patient for discharge.  Tolerated PT well.  She will be discharged back to independent living facility today.  Chronic diastolic CHF, NYHA class 1- (present on admission) - Currently compensated.  Continue diet and fluid restriction.  Outpatient follow-up with cardiology.  Continue metoprolol succinate.  Decrease Imdur to 60 mg daily.  Continue Lasix as needed.  HTN (hypertension), benign- (present on admission) - Continue antihypertensives as above.  Outpatient follow-up with PCP.  Anxiety and depression- (present on admission) -Continue home regimen.  Outpatient follow-up with PCP/psychiatry -Explained to the patient that patient will need to follow-up with PCP regarding adjustment of her current medications including Xanax because these meds might predispose her to recurrent falls.  Patient is currently hesitant to change any of her medications.  COPD (chronic obstructive pulmonary disease) ([place])- (present on admission) - Currently stable.  Continue home regimen.  Outpatient follow-up.  CAD-minor 2014- (present on admission) - Nonobstructive.  Continue aspirin, beta-blocker, statin.  Outpatient follow-up with cardiology  Hyperlipidemia- (present on admission) - Continue statin.  Hypokalemia- (present on admission) - Treated with replacement.  No labs today.  Outpatient follow-up.  OSA (obstructive sleep apnea)- (present on admission) - Will not be able to tolerate mask for now because of maxillofacial/orbital fracture         Consultants: ENT Procedures performed: Echo  disposition: Home/independent living facility Diet recommendation:  Cardiac diet/fluid restriction up to 1500 cc a day  DISCHARGE MEDICATION: Allergies as of 02/17/2022       Reactions   Abilify [aripiprazole] Other (See Comments)   parkinsonism   Bactrim [sulfamethoxazole-trimethoprim] Rash    Penicillins Itching,  Swelling, Rash   Tolerated Cefepime in ED. Has patient had a PCN reaction causing immediate rash, facial/tongue/throat swelling, SOB or lightheadedness with hypotension: Yes Has patient had a PCN reaction causing severe rash involving mucus membranes or skin necrosis: No Has patient had a PCN reaction that required hospitalization: No  Has patient had a PCN reaction occurring within the last 10 years: No        Medication List     STOP taking these medications    alendronate 70 MG tablet Commonly known as: FOSAMAX   cyclobenzaprine 5 MG tablet Commonly known as: FLEXERIL   fluticasone 50 MCG/ACT nasal spray Commonly known as: FLONASE       TAKE these medications    albuterol 108 (90 Base) MCG/ACT inhaler Commonly known as: VENTOLIN HFA Inhale 1-2 puffs into the lungs every 6 (six) hours as needed for wheezing or shortness of breath.   ALPRAZolam 1 MG tablet Commonly known as: XANAX TAKE 1 TABLET THREE TIMES DAILY AS NEEDED FOR ANXIETY. What changed:  how much to take how to take this when to take this additional instructions   aspirin 81 MG tablet Take 81 mg by mouth at bedtime.   budesonide-formoterol 80-4.5 MCG/ACT inhaler Commonly known as: SYMBICORT 2 puffs   diclofenac Sodium 1 % Gel Commonly known as: VOLTAREN Apply 2 g topically 4 (four) times daily. 4 times a day to both knees What changed:  when to take this reasons to take this additional instructions   donepezil 10 MG tablet Commonly known as: ARICEPT Take 1 tablet (10 mg total) by mouth at bedtime. must have office visit   DULoxetine 60 MG capsule Commonly known as: CYMBALTA TAKE 1 CAPSULE A DAY TO BE COMBINED WITH 30MG  CAPSULE What changed: See the new instructions.   DULoxetine 30 MG capsule Commonly known as: CYMBALTA TAKE 1 CAPSULE ONCE DAILY ALONG WITH 60MG  CAPSULE FOR TOTAL 90MG  DAILY. What changed: See the new instructions.   EPINEPHrine 0.3 mg/0.3 mL Soaj  injection Commonly known as: EPI-PEN Inject 0.3 mg into the muscle as needed for anaphylaxis.   fluconazole 100 MG tablet Commonly known as: DIFLUCAN TAKE (1) TABLET DAILY AS NEEDED FOR THRUSH What changed: See the new instructions.   furosemide 40 MG tablet Commonly known as: LASIX Take 1 tablet (40 mg total) by mouth daily as needed for fluid.   ipratropium 0.03 % nasal spray Commonly known as: ATROVENT Place 2 sprays into both nostrils every 12 (twelve) hours as needed (allergies).   isosorbide mononitrate 30 MG 24 hr tablet Commonly known as: IMDUR Take 2 tablets (60 mg total) by mouth daily. TAKE 2 TABLETS IN THE AM AND 1 TABLET IN THE PM. What changed:  how much to take how to take this when to take this   lamoTRIgine 150 MG tablet Commonly known as: LAMICTAL TAKE 1 TABLET EACH DAY. What changed: See the new instructions.   methocarbamol 500 MG tablet Commonly known as: ROBAXIN Take 1 tablet (500 mg total) by mouth every 6 (six) hours as needed for muscle spasms.   metoprolol succinate 100 MG 24 hr tablet Commonly known as: TOPROL-XL take ONE tab daily, take additional 1/2 tab as needed FOR FOR BLOOD PRESSURE > 160/90 What changed:  how much to take how to take this when to take this additional instructions   multivitamin with minerals Tabs tablet Take 1 tablet by mouth daily.   nitroGLYCERIN 0.4 MG SL tablet Commonly known as: NITROSTAT Place 1 tablet (  0.4 mg total) under the tongue every 5 (five) minutes as needed for chest pain (x 3 doses).   oxybutynin 5 MG tablet Commonly known as: DITROPAN Take 1 tablet (5 mg total) by mouth 3 (three) times daily.   oxyCODONE-acetaminophen 10-325 MG tablet Commonly known as: PERCOCET Take 1 tablet by mouth every 6 (six) hours as needed for pain. What changed:  when to take this additional instructions   pantoprazole 40 MG tablet Commonly known as: PROTONIX TAKE 1 TABLET BY MOUTH TWICE DAILY. What changed:  when to take this   polyvinyl alcohol 1.4 % ophthalmic solution Commonly known as: LIQUIFILM TEARS Place 1 drop into both eyes as needed for dry eyes.   rosuvastatin 20 MG tablet Commonly known as: CRESTOR Take 1 tablet (20 mg total) by mouth every evening.   traZODone 50 MG tablet Commonly known as: DESYREL TAKE 2-4 TABLETS AT BEDTIME What changed: See the new instructions.          Subjective: Seen and examined at bedside.  Complains of pain in her face and neck.  No chest pain, nausea, vomiting reported.  Discharge Exam: Vitals:   02/17/22 0505 02/17/22 0822  BP: (!) 148/87   Pulse: 73   Resp: 20   Temp: 98.7 F (37.1 C)   SpO2: 99% 97%    General: No acute distress, currently on room air.  Elderly female lying in bed. Eyes/ENT/neck: Right orbital erythema present.  No elevated JVD.  No obvious masses  respiratory: Bilateral decreased breath sounds at bases with some scattered crackles CVS: S1-S2 heard, rate controlled Abdominal: Soft, nontender, nondistended, no organomegaly, bowel sounds heard Extremities: No cyanosis, clubbing; trace lower extremity edema present CNS: Alert, awake and oriented.  No focal neurologic deficit.  Moving extremities. Lymph: No cervical lymphadenopathy Skin: Some bruising present around the eyes.  No other ecchymosis/lesions Psych: Normal mood, affect and judgment Musculoskeletal: No obvious joint deformity/tenderness/swelling   Condition at discharge: fair  The results of significant diagnostics from this hospitalization (including imaging, microbiology, ancillary and laboratory) are listed below for reference.   Imaging Studies: DG Chest 2 View  Result Date: 02/16/2022 CLINICAL DATA:  Shortness of breath EXAM: CHEST - 2 VIEW COMPARISON:  11/23/2020 FINDINGS: Bilateral calcified pleural plaques again noted, unchanged. Heart is normal size. No acute confluent airspace opacities or effusions. No acute bony abnormality. Aortic  atherosclerosis. IMPRESSION: No acute cardiopulmonary disease. Calcified pleural plaques. Electronically Signed   By: Rolm Baptise M.D.   On: 02/16/2022 02:48   CT HEAD WO CONTRAST  Result Date: 02/15/2022 CLINICAL DATA:  Head trauma, minor (Age >= 65y).  Multiple falls. EXAM: CT HEAD WITHOUT CONTRAST TECHNIQUE: Contiguous axial images were obtained from the base of the skull through the vertex without intravenous contrast. RADIATION DOSE REDUCTION: This exam was performed according to the departmental dose-optimization program which includes automated exposure control, adjustment of the mA and/or kV according to patient size and/or use of iterative reconstruction technique. COMPARISON:  None. FINDINGS: Brain: Mild age related volume loss. No acute intracranial abnormality. Specifically, no hemorrhage, hydrocephalus, mass lesion, acute infarction, or significant intracranial injury. Vascular: No hyperdense vessel or unexpected calcification. Skull: No acute calvarial abnormality. Sinuses/Orbits: Near complete opacification of the right maxillary sinus and scattered right ethmoid air cells. Other: None IMPRESSION: No acute intracranial abnormality. Fluid/near complete opacification of the right maxillary sinus and scattered right ethmoid air cells. Electronically Signed   By: Rolm Baptise M.D.   On: 02/15/2022 23:11   CT  Cervical Spine Wo Contrast  Result Date: 02/15/2022 CLINICAL DATA:  Multiple falls.  Neck trauma (Age >= 65y) EXAM: CT CERVICAL SPINE WITHOUT CONTRAST TECHNIQUE: Multidetector CT imaging of the cervical spine was performed without intravenous contrast. Multiplanar CT image reconstructions were also generated. RADIATION DOSE REDUCTION: This exam was performed according to the departmental dose-optimization program which includes automated exposure control, adjustment of the mA and/or kV according to patient size and/or use of iterative reconstruction technique. COMPARISON:  07/14/2021  FINDINGS: Alignment: No subluxation Skull base and vertebrae: No acute fracture. No primary bone lesion or focal pathologic process. Soft tissues and spinal canal: No prevertebral fluid or swelling. No visible canal hematoma. Disc levels: Diffuse moderate to advanced degenerative disc disease and facet disease. Upper chest: No acute findings Other: None IMPRESSION: Degenerative disc and facet disease. No acute bony abnormality. Electronically Signed   By: Rolm Baptise M.D.   On: 02/15/2022 23:12   ECHOCARDIOGRAM COMPLETE  Result Date: 02/17/2022    ECHOCARDIOGRAM REPORT   Patient Name:   Linda [name] Date of Exam: 02/17/2022 Medical Rec #:  [id number]          Height:       64.0 in Accession #:    [id number]         Weight:       143.2 lb Date of Birth:  February 19, 1946          BSA:          1.697 m Patient Age:    42 years           BP:           167/119 mmHg Patient Gender: F                  HR:           65 bpm. Exam Location:  Inpatient Procedure: 2D Echo, Cardiac Doppler and Color Doppler Indications:    Syncope R55  History:        Patient has prior history of Echocardiogram examinations, most                 recent 10/21/2017. CHF, COPD; Risk Factors:Former Smoker,                 Hypertension and Sleep Apnea. Multiple falls within the last few                 weeks.  Sonographer:    Darlina Sicilian RDCS Referring Phys: [id number] DAVID MANUEL Robbins  1. Left ventricular ejection fraction, by estimation, is 70 to 75%. The left ventricle has hyperdynamic function. The left ventricle has no regional wall motion abnormalities. Left ventricular diastolic parameters are consistent with Grade I diastolic dysfunction (impaired relaxation).  2. Right ventricular systolic function is normal. The right ventricular size is normal. Tricuspid regurgitation signal is inadequate for assessing PA pressure.  3. The mitral valve is normal in structure. Trivial mitral valve regurgitation. No evidence of mitral  stenosis.  4. The aortic valve is tricuspid. Aortic valve regurgitation is not visualized. Aortic valve sclerosis is present, with no evidence of aortic valve stenosis.  5. The inferior vena cava is normal in size with greater than 50% respiratory variability, suggesting right atrial pressure of 3 mmHg. FINDINGS  Left Ventricle: Left ventricular ejection fraction, by estimation, is 70 to 75%. The left ventricle has hyperdynamic function. The left ventricle has no regional wall motion abnormalities. The left ventricular internal cavity  size was normal in size. There is no left ventricular hypertrophy. Left ventricular diastolic parameters are consistent with Grade I diastolic dysfunction (impaired relaxation). Right Ventricle: The right ventricular size is normal. Right ventricular systolic function is normal. Tricuspid regurgitation signal is inadequate for assessing PA pressure. The tricuspid regurgitant velocity is 1.68 m/s, and with an assumed right atrial  pressure of 3 mmHg, the estimated right ventricular systolic pressure is 09.9 mmHg. Left Atrium: Left atrial size was normal in size. Right Atrium: Right atrial size was normal in size. Pericardium: There is no evidence of pericardial effusion. Mitral Valve: The mitral valve is normal in structure. Mild mitral annular calcification. Trivial mitral valve regurgitation. No evidence of mitral valve stenosis. Tricuspid Valve: The tricuspid valve is normal in structure. Tricuspid valve regurgitation is trivial. No evidence of tricuspid stenosis. Aortic Valve: The aortic valve is tricuspid. Aortic valve regurgitation is not visualized. Aortic valve sclerosis is present, with no evidence of aortic valve stenosis. Pulmonic Valve: The pulmonic valve was normal in structure. Pulmonic valve regurgitation is trivial. No evidence of pulmonic stenosis. Aorta: The aortic root is normal in size and structure. Venous: The inferior vena cava is normal in size with greater than  50% respiratory variability, suggesting right atrial pressure of 3 mmHg. IAS/Shunts: No atrial level shunt detected by color flow Doppler.  LEFT VENTRICLE PLAX 2D LVIDd:         4.10 cm   Diastology LVIDs:         3.30 cm   LV e' medial:    4.22 cm/s LV PW:         0.70 cm   LV E/e' medial:  11.4 LV IVS:        1.40 cm   LV e' lateral:   5.51 cm/s LVOT diam:     2.00 cm   LV E/e' lateral: 8.7 LV SV:         77 LV SV Index:   46 LVOT Area:     3.14 cm  RIGHT VENTRICLE RV S prime:     15.30 cm/s TAPSE (M-mode): 2.3 cm LEFT ATRIUM             Index        RIGHT ATRIUM          Index LA diam:        3.50 cm 2.06 cm/m   RA Area:     8.40 cm LA Vol (A2C):   25.5 ml 15.02 ml/m  RA Volume:   11.30 ml 6.66 ml/m LA Vol (A4C):   43.0 ml 25.33 ml/m LA Biplane Vol: 33.8 ml 19.91 ml/m  AORTIC VALVE LVOT Vmax:   106.00 cm/s LVOT Vmean:  75.400 cm/s LVOT VTI:    0.246 m  AORTA Ao Root diam: 2.80 cm Ao Asc diam:  2.90 cm MITRAL VALVE               TRICUSPID VALVE MV Area (PHT): 2.03 cm    TR Peak grad:   11.3 mmHg MV Decel Time: 373 msec    TR Vmax:        168.00 cm/s MV E velocity: 48.00 cm/s MV A velocity: 65.60 cm/s  SHUNTS MV E/A ratio:  0.73        Systemic VTI:  0.25 m                            Systemic Diam: 2.00 cm  [name] MD Electronically signed by [name] MD Signature Date/Time: 02/17/2022/10:49:04 AM    Final    VAS US CAROTID  Result Date: 02/17/2022 Carotid Arterial Duplex Study Patient Name:  Linda [name]  Date of Exam:   02/16/2022 Medical Rec #: [id number]           Accession #:    [id number] Date of Birth: [birth year]           Patient Gender: F Patient Age:   48 years Exam Location:  [facility] Procedure:      VAS US CAROTID Referring Phys: DAVID ORTIZ --------------------------------------------------------------------------------  Indications:       Syncope. Risk Factors:      Hypertension, no history of smoking, coronary artery disease. Other Factors:     CHF. Limitations         Today's exam was limited due to poor ultrasound                    tissue/interface, vessel calcification/shadowing, and very                    tortuous vessels. Comparison Study:  Previous exam 10/21/2017 Performing Technologist: Rogelia Rohrer RVT, RDMS  Examination Guidelines: A complete evaluation includes B-mode imaging, spectral Doppler, color Doppler, and power Doppler as needed of all accessible portions of each vessel. Bilateral testing is considered an integral part of a complete examination. Limited examinations for reoccurring indications may be performed as noted.  Right Carotid Findings: +----------+--------+--------+--------+-------------------------+--------+             PSV cm/s EDV cm/s Stenosis Plaque Description        Comments  +----------+--------+--------+--------+-------------------------+--------+  CCA Prox   74       14                                                    +----------+--------+--------+--------+-------------------------+--------+  CCA Distal 62       16                homogeneous and smooth              +----------+--------+--------+--------+-------------------------+--------+  ICA Prox   159      27       40-59%   heterogenous and calcific tortuous  +----------+--------+--------+--------+-------------------------+--------+  ICA Mid    79       21                                          tortuous  +----------+--------+--------+--------+-------------------------+--------+  ICA Distal 115      33                                          tortuous  +----------+--------+--------+--------+-------------------------+--------+  ECA        265      37       >50%     heterogenous and calcific tortuous  +----------+--------+--------+--------+-------------------------+--------+ +----------+--------+-------+----------------+-------------------+             PSV cm/s EDV cms Describe         Arm Pressure (  mmHG)  +----------+--------+-------+----------------+-------------------+   Subclavian 129              Multiphasic, WNL                      +----------+--------+-------+----------------+-------------------+ +---------+--------+--------+--------------+  Vertebral PSV cm/s EDV cm/s Not identified  +---------+--------+--------+--------------+  Left Carotid Findings: +----------+--------+--------+--------+-------------------------+---------+             PSV cm/s EDV cm/s Stenosis Plaque Description        Comments   +----------+--------+--------+--------+-------------------------+---------+  CCA Prox   86       18                                          tortuous   +----------+--------+--------+--------+-------------------------+---------+  CCA Distal 58       14                smooth and homogeneous               +----------+--------+--------+--------+-------------------------+---------+  ICA Prox   92       18       1-39%    heterogenous and calcific            +----------+--------+--------+--------+-------------------------+---------+  ICA Distal 125      35                                          tortuous   +----------+--------+--------+--------+-------------------------+---------+  ECA        78       12                                          shadowing  +----------+--------+--------+--------+-------------------------+---------+ +----------+--------+--------+----------------+-------------------+             PSV cm/s EDV cm/s Describe         Arm Pressure (mmHG)  +----------+--------+--------+----------------+-------------------+  Subclavian 105               Multiphasic, WNL                      +----------+--------+--------+----------------+-------------------+ +---------+--------+--+--------+--+---------+  Vertebral PSV cm/s 59 EDV cm/s 14 Antegrade  +---------+--------+--+--------+--+---------+   Summary: Right Carotid: Velocities in the right ICA are consistent with a 40-59%                stenosis. Left Carotid: Velocities in the left ICA are consistent with a 1-39% stenosis.  Vertebrals:  Left vertebral artery demonstrates antegrade flow. Right vertebral              artery was not visualized. Subclavians: Normal flow hemodynamics were seen in bilateral subclavian              arteries. *See table(s) above for measurements and observations.  Electronically signed by Monica Martinez MD on 02/17/2022 at 10:27:37 AM.    Final    CT Maxillofacial Wo Contrast  Result Date: 02/15/2022 CLINICAL DATA:  Facial trauma, blunt.  Multiple falls. EXAM: CT MAXILLOFACIAL WITHOUT CONTRAST TECHNIQUE: Multidetector CT imaging of the maxillofacial structures was performed. Multiplanar CT image reconstructions were also generated. RADIATION DOSE REDUCTION: This exam was performed according to the  departmental dose-optimization program which includes automated exposure control, adjustment of the mA and/or kV according to patient size and/or use of iterative reconstruction technique. COMPARISON:  None. FINDINGS: Osseous: Fracture noted through the inferior anterior wall of the right maxillary sinus. Orbits: Mildly depressed fracture noted through the floor of the right orbit. No muscular entrapment. Sinuses: Blood within the right maxillary sinus and scattered right ethmoid air cells. Soft tissues: Negative Limited intracranial: See head CT report IMPRESSION: Fracture through the anterior inferior wall of the right maxillary sinus. Fracture through the right orbital floor with mild depression. Electronically Signed   By: Rolm Baptise M.D.   On: 02/15/2022 23:15    Microbiology: Results for orders placed or performed during the hospital encounter of 02/16/22  Resp Panel by RT-PCR (Flu A&B, Covid) Nasopharyngeal Swab     Status: None   Collection Time: 02/16/22  4:17 AM   Specimen: Nasopharyngeal Swab; Nasopharyngeal(NP) swabs in vial transport medium  Result Value Ref Range Status   SARS Coronavirus 2 by RT PCR NEGATIVE NEGATIVE Final    Comment: (NOTE) SARS-CoV-2 target nucleic acids are NOT  DETECTED.  The SARS-CoV-2 RNA is generally detectable in upper respiratory specimens during the acute phase of infection. The lowest concentration of SARS-CoV-2 viral copies this assay can detect is 138 copies/mL. A negative result does not preclude SARS-Cov-2 infection and should not be used as the sole basis for treatment or other patient management decisions. A negative result may occur with  improper specimen collection/handling, submission of specimen other than nasopharyngeal swab, presence of viral mutation(s) within the areas targeted by this assay, and inadequate number of viral copies(<138 copies/mL). A negative result must be combined with clinical observations, patient history, and epidemiological information. The expected result is Negative.  Fact Sheet for Patients:  EntrepreneurPulse.com.au  Fact Sheet for Healthcare Providers:  IncredibleEmployment.be  This test is no t yet approved or cleared by the [place] FDA and  has been authorized for detection and/or diagnosis of SARS-CoV-2 by FDA under an Emergency Use Authorization (EUA). This EUA will remain  in effect (meaning this test can be used) for the duration of the COVID-19 declaration under Section 564(b)(1) of the Act, 21 U.S.C.section 360bbb-3(b)(1), unless the authorization is terminated  or revoked sooner.       Influenza A by PCR NEGATIVE NEGATIVE Final   Influenza B by PCR NEGATIVE NEGATIVE Final    Comment: (NOTE) The Xpert Xpress SARS-CoV-2/FLU/RSV plus assay is intended as an aid in the diagnosis of influenza from Nasopharyngeal swab specimens and should not be used as a sole basis for treatment. Nasal washings and aspirates are unacceptable for Xpert Xpress SARS-CoV-2/FLU/RSV testing.  Fact Sheet for Patients: EntrepreneurPulse.com.au  Fact Sheet for Healthcare Providers: IncredibleEmployment.be  This test is not yet  approved or cleared by the [place] FDA and has been authorized for detection and/or diagnosis of SARS-CoV-2 by FDA under an Emergency Use Authorization (EUA). This EUA will remain in effect (meaning this test can be used) for the duration of the COVID-19 declaration under Section 564(b)(1) of the Act, 21 U.S.C. section 360bbb-3(b)(1), unless the authorization is terminated or revoked.  Performed at [facility], [address] [place], [place] [address]    *Note: Due to a large number of results and/or encounters for the requested time period, some results have not been displayed. A complete set of results can be found in Results Review.    Labs: CBC: Recent Labs  Lab  02/15/22 2243 02/16/22 1456  WBC 6.7 7.1  HGB 13.3 13.6  HCT 39.1 40.6  MCV 86.5 87.7  PLT 229 715   Basic Metabolic Panel: Recent Labs  Lab 02/15/22 2243 02/16/22 1456  NA 136 137  K 2.7* 3.2*  CL 99 104  CO2 26 25  GLUCOSE 110* 128*  BUN 12 9  CREATININE 0.91 0.69  CALCIUM 9.0 8.8*  MG 2.0 2.0  PHOS  --  3.2   Liver Function Tests: Recent Labs  Lab 02/16/22 1456  AST 20  ALT 14  ALKPHOS 74  BILITOT 0.5  PROT 7.5  ALBUMIN 4.2   CBG: No results for input(s): GLUCAP in the last 168 hours.  Discharge time spent: greater than 30 minutes.  Signed: Aline August, MD [facility] 02/17/2022

## 2022-02-17 NOTE — Assessment & Plan Note (Signed)
-   Currently compensated.  Continue diet and fluid restriction.  Outpatient follow-up with cardiology.  Continue metoprolol succinate.  Decrease Imdur to 60 mg daily.  Continue Lasix as needed.

## 2022-02-17 NOTE — Assessment & Plan Note (Signed)
Continue statin. 

## 2022-02-17 NOTE — Assessment & Plan Note (Signed)
-   Will not be able to tolerate mask for now because of maxillofacial/orbital fracture

## 2022-02-17 NOTE — Plan of Care (Signed)
Discharge instructions reviewed with patient, questions answered, verbalized understanding. Patient transported via wheelchair to main entrance to be taken home by son.

## 2022-02-17 NOTE — Consult Note (Signed)
Reason for Consult:orbital fracture Referring Physician: hospital  Linda [name] is an 76 y.o. female.  HPI: hx of a fall multiple times and the etiology is unclear. She is getting work up. She has no diplopia and no vision changes. Occlusion is normal. No nasal obstruction   Past Medical History:  Diagnosis Date   Acute upper GI bleed 06/2014   while pt taking coumadin, plavix, and meloxicam---despite being told not to take coumadin.   Anginal pain ([place])    Nonobstructive CAD 2014; however, her cardiologist put her on a statin for this and NOT for hyperlipidemia per pt report.  Atyp CP 08/2017 at card f/u, plan for myoc perf imaging.   Anxiety    panic attacks   Asthma    w/ asbestososis    BPPV (benign paroxysmal positional vertigo) 12/16/2012   Chronic diastolic CHF (congestive heart failure) (HCC)    dry wt as of 11/06/16 is 168 lbs.   Chronic lower back pain    COPD (chronic obstructive pulmonary disease) (HCC)    DDD (degenerative disc disease)    lumbar and cervical.    Diverticular disease    Fibromyalgia    Patient states dx was around her late 65s but she had sx's for years prior to this.   H/O hiatal hernia    History of pneumonia    hospitalized 12/2011, 02/2013, and 07/2013 [facility]) for this   HTN (hypertension)    Renal artery dopplers 04/2013 neg for stenosis.   Hypervitaminosis D 09/27/2019   over-supplemented.  Stopped vit D and plan recheck 2 mo.   Idiopathic angio-edema-urticaria 72014; 2021   Angioedema component was very minimal.  2021->Dr. Bobbitt (allergist) eval.   Insomnia    Iron deficiency anemia    Hematologist in [place], [place] did extensive w/u; no cause found; failed oral supplement;; gets fairly regular (q64m or so) IV iron infusions (Venofer -iron sucrose- 200mg  with procrit.  "for 14 yr I've been getting blood work q month & getting infusions prn" (07/12/2013).  Dr. Marin Olp locally, iron infusions done, EPO deficiency dx'd   Kidney stone     Migraine syndrome    "not as often anymore; used to be ~ q wk" (07/12/2013)   Mixed incontinence urge and stress    Nephrolithiasis    "passed all on my own or they are still in there" (07/12/2013)   Neuroleptic induced parkinsonism (HCC) 2018   Dr. Carles Collet, neuro, saw her 11/24/17 and recommended d/c of abilify as first step.  D/c'd abilify and pt got complete recovery.   Oropharyngeal dysphagia    swallowing study speech path 05/2020. Gastric bx's showed gastritis, h pylori NEG   OSA on CPAP    prior to move to [place]--had another sleep study 10/2015 w/pulm Dr. Camillo Flaming.   Osteoarthritis    "severe; progressing fast" (07/12/2013); multiple joints-not surgical candidate for TKR (03/2015).  Triamcinolon knee injections by Dr. Tessa Lerner 12/2017.   Pernicious anemia 08/24/2014   Pleural plaque with presence of asbestos 07/22/2013   Pulmonary embolism ([place]) 07/2013   Dx at [facility] with very small peripheral upper lobe pe 07/2013: pt took coumadin for about 8-9 mo   Pyelonephritis    "several times over the last 30 yr" (07/12/2013)   RBBB (right bundle branch block)    Recurrent major depression ([place])    Recurrent UTI    hx of hospitalization for pyelonephritis; started abx prophylaxis 06/2015   Syncope    Hypotensive; ED visit--Dr. Terrence Dupont did Cath--nonobstructive CAD,  EF 55-60%.  In retrospect, suspect pt rx med misuse/polypharmacy    Past Surgical History:  Procedure Laterality Date   APPENDECTOMY  1960   AXILLARY SURGERY Left 1978   Multiple "lump" in armpit per pt   BIOPSY  06/17/2020   Gastric bx->gastritis, h pylori neg.  Procedure: BIOPSY;  Surgeon: Carol Ada, MD;  Location: [facility];  Service: Endoscopy;;   CARDIAC CATHETERIZATION  01/2013   nonobstructive CAD, EF 55-60%   CARDIOVASCULAR STRESS TEST  02/22/15   Low risk myocard perf imaging; wall motion normal, normal EF   carotid duplex doppler  10/21/2017   R vertebral flow suggestive of possible distal obstruction.  Pt declined further w/u as  of 10/29/17 but need to revisit this problem periodically.   COCCYX REMOVAL  1972   DEXA  06/05/2017   T-score -3.1   DILATION AND CURETTAGE OF UTERUS  ? 1970's   ESOPHAGOGASTRODUODENOSCOPY N/A 07/19/2014   Gastritis found + in the setting of supratherapeutic INR, +plavix, + meloxicam.   ESOPHAGOGASTRODUODENOSCOPY (EGD) WITH PROPOFOL N/A 06/17/2020   NO stricture or other prob to explain pt's dysphagia, dilation was done anyway.  Gastric bx's-->gastritis, h pylori neg. Procedure: ESOPHAGOGASTRODUODENOSCOPY (EGD) WITH PROPOFOL;  Surgeon: Carol Ada, MD;  Location: [facility];  Service: Endoscopy;  Laterality: N/A;   EYE SURGERY Left 2012-2013   "injections for ~ 1 yr; don't really know what for" (07/12/2013)   HEEL SPUR SURGERY Left 2008   KNEE SURGERY  2005   LEFT HEART CATHETERIZATION WITH CORONARY ANGIOGRAM N/A 01/30/2013   Procedure: LEFT HEART CATHETERIZATION WITH CORONARY ANGIOGRAM;  Surgeon: Clent Demark, MD;  Location: [place] CATH [facility];  Service: Cardiovascular;  Laterality: N/A;   MALONEY DILATION  06/17/2020   Procedure: Venia Minks DILATION;  Surgeon: Carol Ada, MD;  Location: [facility];  Service: Endoscopy;;   PLANTAR FASCIA RELEASE Left 2008   SPIROMETRY  04/25/14   In hosp for acute asthma/COPD flare: mixed obstructive and restrictive lung disease. The FEV1 is severely reduced at 45% predicted.  FEV1 signif decreased compared to prior spirometry 07/23/13.   TENDON RELEASE  1996   Right forearm and hand   TOTAL ABDOMINAL HYSTERECTOMY  1974   TRANSTHORACIC ECHOCARDIOGRAM  01/2013; 04/2014;08/2015; 09/2017   2014--NORMAL.  2015--focal basal septal hypertrophy, EF 55-60%, grade I diast dysfxn, mild LAE.  08/2015 EF 55-60%, nl LV syst fxn, grade I DD, valves wnl. 10/21/17: EF 65-70%, grd I DD, o/w normal.    Family History  Problem Relation Age of Onset   Arthritis Mother    Kidney disease Mother    Heart disease Father    Stroke Father    Hypertension Father    Diabetes Father     Heart attack Father    Heart attack Paternal Grandmother    Diabetes Sister        one sister   Hypertension Sister    Asthma Sister    Hypertension Brother    Asthma Brother    Asthma Daughter    Multiple sclerosis Son     Social History:  reports that she has never smoked. She has never used smokeless tobacco. She reports that she does not drink alcohol and does not use drugs.  Allergies:  Allergies  Allergen Reactions   Abilify [Aripiprazole] Other (See Comments)    parkinsonism   Bactrim [Sulfamethoxazole-Trimethoprim] Rash   Penicillins Itching, Swelling and Rash    Tolerated Cefepime in ED. Has patient had a PCN reaction causing immediate rash, facial/tongue/throat  swelling, SOB or lightheadedness with hypotension: Yes Has patient had a PCN reaction causing severe rash involving mucus membranes or skin necrosis: No Has patient had a PCN reaction that required hospitalization: No  Has patient had a PCN reaction occurring within the last 10 years: No     Medications: I have reviewed the patient's current medications.  Results for orders placed or performed during the hospital encounter of 02/16/22 (from the past 48 hour(s))  Basic metabolic panel     Status: Abnormal   Collection Time: 02/15/22 10:43 PM  Result Value Ref Range   Sodium 136 135 - 145 mmol/L   Potassium 2.7 (LL) 3.5 - 5.1 mmol/L    Comment: CRITICAL RESULT CALLED TO, READ BACK BY AND VERIFIED WITH: L. ADKINS,CHARGE RN [id number] 02/15/2022 T. TYSOR    Chloride 99 98 - 111 mmol/L   CO2 26 22 - 32 mmol/L   Glucose, Bld 110 (H) 70 - 99 mg/dL    Comment: Glucose reference range applies only to samples taken after fasting for at least 8 hours.   BUN 12 8 - 23 mg/dL   Creatinine, Ser 0.91 0.44 - 1.00 mg/dL   Calcium 9.0 8.9 - 10.3 mg/dL   GFR, Estimated >60 >60 mL/min    Comment: (NOTE) Calculated using the CKD-EPI Creatinine Equation (2021)    Anion gap 11 5 - 15    Comment: Performed at [facility], [address] [place], [place] [address]  CBC     Status: None   Collection Time: 02/15/22 10:43 PM  Result Value Ref Range   WBC 6.7 4.0 - 10.5 K/uL   RBC 4.52 3.87 - 5.11 MIL/uL   Hemoglobin 13.3 12.0 - 15.0 g/dL   HCT 39.1 36.0 - 46.0 %   MCV 86.5 80.0 - 100.0 fL   MCH 29.4 26.0 - 34.0 pg   MCHC 34.0 30.0 - 36.0 g/dL   RDW 12.7 11.5 - 15.5 %   Platelets 229 150 - 400 K/uL   nRBC 0.0 0.0 - 0.2 %    Comment: Performed at [facility], [address] [place], [place] [address]  Urinalysis, Routine w reflex microscopic Urine, Clean Catch     Status: Abnormal   Collection Time: 02/15/22 10:43 PM  Result Value Ref Range   Color, Urine YELLOW YELLOW   APPearance HAZY (A) CLEAR   Specific Gravity, Urine 1.020 1.005 - 1.030   pH 6.0 5.0 - 8.0   Glucose, UA NEGATIVE NEGATIVE mg/dL   Hgb urine dipstick TRACE (A) NEGATIVE   Bilirubin Urine SMALL (A) NEGATIVE   Ketones, ur 15 (A) NEGATIVE mg/dL   Protein, ur 30 (A) NEGATIVE mg/dL   Nitrite NEGATIVE NEGATIVE   Leukocytes,Ua TRACE (A) NEGATIVE    Comment: Performed at [facility], [address] [place], [place] [address]  Urinalysis, Microscopic (reflex)     Status: Abnormal   Collection Time: 02/15/22 10:43 PM  Result Value Ref Range   RBC / HPF 6-10 0 - 5 RBC/hpf   WBC, UA 0-5 0 - 5 WBC/hpf   Bacteria, UA RARE (A) NONE SEEN   Squamous Epithelial / LPF 0-5 0 - 5    Comment: Performed at [facility], [address] [place], [place] [address]  Magnesium     Status: None   Collection Time: 02/15/22 10:43 PM  Result Value Ref Range   Magnesium 2.0 1.7 - 2.4 mg/dL  Comment: Performed at [facility], [address] [place], [place] [address]  Resp Panel by RT-PCR (Flu A&B, Covid) Nasopharyngeal Swab     Status: None   Collection Time: 02/16/22  4:17 AM   Specimen: Nasopharyngeal Swab; Nasopharyngeal(NP) swabs in vial transport medium  Result Value Ref Range   SARS  Coronavirus 2 by RT PCR NEGATIVE NEGATIVE    Comment: (NOTE) SARS-CoV-2 target nucleic acids are NOT DETECTED.  The SARS-CoV-2 RNA is generally detectable in upper respiratory specimens during the acute phase of infection. The lowest concentration of SARS-CoV-2 viral copies this assay can detect is 138 copies/mL. A negative result does not preclude SARS-Cov-2 infection and should not be used as the sole basis for treatment or other patient management decisions. A negative result may occur with  improper specimen collection/handling, submission of specimen other than nasopharyngeal swab, presence of viral mutation(s) within the areas targeted by this assay, and inadequate number of viral copies(<138 copies/mL). A negative result must be combined with clinical observations, patient history, and epidemiological information. The expected result is Negative.  Fact Sheet for Patients:  EntrepreneurPulse.com.au  Fact Sheet for Healthcare Providers:  IncredibleEmployment.be  This test is no t yet approved or cleared by the [place] FDA and  has been authorized for detection and/or diagnosis of SARS-CoV-2 by FDA under an Emergency Use Authorization (EUA). This EUA will remain  in effect (meaning this test can be used) for the duration of the COVID-19 declaration under Section 564(b)(1) of the Act, 21 U.S.C.section 360bbb-3(b)(1), unless the authorization is terminated  or revoked sooner.       Influenza A by PCR NEGATIVE NEGATIVE   Influenza B by PCR NEGATIVE NEGATIVE    Comment: (NOTE) The Xpert Xpress SARS-CoV-2/FLU/RSV plus assay is intended as an aid in the diagnosis of influenza from Nasopharyngeal swab specimens and should not be used as a sole basis for treatment. Nasal washings and aspirates are unacceptable for Xpert Xpress SARS-CoV-2/FLU/RSV testing.  Fact Sheet for Patients: EntrepreneurPulse.com.au  Fact Sheet  for Healthcare Providers: IncredibleEmployment.be  This test is not yet approved or cleared by the [place] FDA and has been authorized for detection and/or diagnosis of SARS-CoV-2 by FDA under an Emergency Use Authorization (EUA). This EUA will remain in effect (meaning this test can be used) for the duration of the COVID-19 declaration under Section 564(b)(1) of the Act, 21 U.S.C. section 360bbb-3(b)(1), unless the authorization is terminated or revoked.  Performed at [facility], [address] [place], [place] [address]   CBC     Status: None   Collection Time: 02/16/22  2:56 PM  Result Value Ref Range   WBC 7.1 4.0 - 10.5 K/uL   RBC 4.63 3.87 - 5.11 MIL/uL   Hemoglobin 13.6 12.0 - 15.0 g/dL   HCT 40.6 36.0 - 46.0 %   MCV 87.7 80.0 - 100.0 fL   MCH 29.4 26.0 - 34.0 pg   MCHC 33.5 30.0 - 36.0 g/dL   RDW 12.8 11.5 - 15.5 %   Platelets 234 150 - 400 K/uL   nRBC 0.0 0.0 - 0.2 %    Comment: Performed at [facility], [place] [address], [place], [place] [address]  Comprehensive metabolic panel     Status: Abnormal   Collection Time: 02/16/22  2:56 PM  Result Value Ref Range   Sodium 137 135 - 145 mmol/L   Potassium 3.2 (L) 3.5 - 5.1 mmol/L   Chloride  104 98 - 111 mmol/L   CO2 25 22 - 32 mmol/L   Glucose, Bld 128 (H) 70 - 99 mg/dL    Comment: Glucose reference range applies only to samples taken after fasting for at least 8 hours.   BUN 9 8 - 23 mg/dL   Creatinine, Ser 0.69 0.44 - 1.00 mg/dL   Calcium 8.8 (L) 8.9 - 10.3 mg/dL   Total Protein 7.5 6.5 - 8.1 g/dL   Albumin 4.2 3.5 - 5.0 g/dL   AST 20 15 - 41 U/L   ALT 14 0 - 44 U/L   Alkaline Phosphatase 74 38 - 126 U/L   Total Bilirubin 0.5 0.3 - 1.2 mg/dL   GFR, Estimated >60 >60 mL/min    Comment: (NOTE) Calculated using the CKD-EPI Creatinine Equation (2021)    Anion gap 8 5 - 15    Comment: Performed at [facility], [place] [address],  [place], [place] [address]  Magnesium     Status: None   Collection Time: 02/16/22  2:56 PM  Result Value Ref Range   Magnesium 2.0 1.7 - 2.4 mg/dL    Comment: Performed at [facility], [place] [address], [place], [place] [address]  Phosphorus     Status: None   Collection Time: 02/16/22  2:56 PM  Result Value Ref Range   Phosphorus 3.2 2.5 - 4.6 mg/dL    Comment: Performed at [facility], [place] [address] [place], [place] [address]   *Note: Due to a large number of results and/or encounters for the requested time period, some results have not been displayed. A complete set of results can be found in Results Review.    DG Chest 2 View  Result Date: 02/16/2022 CLINICAL DATA:  Shortness of breath EXAM: CHEST - 2 VIEW COMPARISON:  11/23/2020 FINDINGS: Bilateral calcified pleural plaques again noted, unchanged. Heart is normal size. No acute confluent airspace opacities or effusions. No acute bony abnormality. Aortic atherosclerosis. IMPRESSION: No acute cardiopulmonary disease. Calcified pleural plaques. Electronically Signed   By: Rolm Baptise M.D.   On: 02/16/2022 02:48   CT HEAD WO CONTRAST  Result Date: 02/15/2022 CLINICAL DATA:  Head trauma, minor (Age >= 65y).  Multiple falls. EXAM: CT HEAD WITHOUT CONTRAST TECHNIQUE: Contiguous axial images were obtained from the base of the skull through the vertex without intravenous contrast. RADIATION DOSE REDUCTION: This exam was performed according to the departmental dose-optimization program which includes automated exposure control, adjustment of the mA and/or kV according to patient size and/or use of iterative reconstruction technique. COMPARISON:  None. FINDINGS: Brain: Mild age related volume loss. No acute intracranial abnormality. Specifically, no hemorrhage, hydrocephalus, mass lesion, acute infarction, or significant intracranial injury. Vascular: No hyperdense vessel or unexpected calcification. Skull: No acute  calvarial abnormality. Sinuses/Orbits: Near complete opacification of the right maxillary sinus and scattered right ethmoid air cells. Other: None IMPRESSION: No acute intracranial abnormality. Fluid/near complete opacification of the right maxillary sinus and scattered right ethmoid air cells. Electronically Signed   By: Rolm Baptise M.D.   On: 02/15/2022 23:11   CT Cervical Spine Wo Contrast  Result Date: 02/15/2022 CLINICAL DATA:  Multiple falls.  Neck trauma (Age >= 65y) EXAM: CT CERVICAL SPINE WITHOUT CONTRAST TECHNIQUE: Multidetector CT imaging of the cervical spine was performed without intravenous contrast. Multiplanar CT image reconstructions were also generated. RADIATION DOSE REDUCTION: This exam was performed according to the departmental dose-optimization program which includes automated exposure control, adjustment of the  mA and/or kV according to patient size and/or use of iterative reconstruction technique. COMPARISON:  07/14/2021 FINDINGS: Alignment: No subluxation Skull base and vertebrae: No acute fracture. No primary bone lesion or focal pathologic process. Soft tissues and spinal canal: No prevertebral fluid or swelling. No visible canal hematoma. Disc levels: Diffuse moderate to advanced degenerative disc disease and facet disease. Upper chest: No acute findings Other: None IMPRESSION: Degenerative disc and facet disease. No acute bony abnormality. Electronically Signed   By: Rolm Baptise M.D.   On: 02/15/2022 23:12   VAS US CAROTID  Result Date: 02/16/2022 Carotid Arterial Duplex Study Patient Name:  JAMIE-LEE GALDAMEZ  Date of Exam:   02/16/2022 Medical Rec #: [id number]           Accession #:    [id number] Date of Birth: 21-Feb-1946           Patient Gender: F Patient Age:   40 years Exam Location:  [facility] Procedure:      VAS US CAROTID Referring Phys: DAVID ORTIZ --------------------------------------------------------------------------------  Indications:       Syncope.  Risk Factors:      Hypertension, no history of smoking, coronary artery disease. Other Factors:     CHF. Limitations        Today's exam was limited due to poor ultrasound                    tissue/interface, vessel calcification/shadowing, and very                    tortuous vessels. Comparison Study:  Previous exam 10/21/2017 Performing Technologist: Rogelia Rohrer RVT, RDMS  Examination Guidelines: A complete evaluation includes B-mode imaging, spectral Doppler, color Doppler, and power Doppler as needed of all accessible portions of each vessel. Bilateral testing is considered an integral part of a complete examination. Limited examinations for reoccurring indications may be performed as noted.  Right Carotid Findings: +----------+--------+--------+--------+-------------------------+--------+             PSV cm/s EDV cm/s Stenosis Plaque Description        Comments  +----------+--------+--------+--------+-------------------------+--------+  CCA Prox   74       14                                                    +----------+--------+--------+--------+-------------------------+--------+  CCA Distal 62       16                homogeneous and smooth              +----------+--------+--------+--------+-------------------------+--------+  ICA Prox   159      27       40-59%   heterogenous and calcific tortuous  +----------+--------+--------+--------+-------------------------+--------+  ICA Mid    79       21                                          tortuous  +----------+--------+--------+--------+-------------------------+--------+  ICA Distal 115      33  tortuous  +----------+--------+--------+--------+-------------------------+--------+  ECA        265      37       >50%     heterogenous and calcific tortuous  +----------+--------+--------+--------+-------------------------+--------+ +----------+--------+-------+----------------+-------------------+             PSV cm/s EDV  cms Describe         Arm Pressure (mmHG)  +----------+--------+-------+----------------+-------------------+  Subclavian 129              Multiphasic, WNL                      +----------+--------+-------+----------------+-------------------+ +---------+--------+--------+--------------+  Vertebral PSV cm/s EDV cm/s Not identified  +---------+--------+--------+--------------+  Left Carotid Findings: +----------+--------+--------+--------+-------------------------+---------+             PSV cm/s EDV cm/s Stenosis Plaque Description        Comments   +----------+--------+--------+--------+-------------------------+---------+  CCA Prox   86       18                                          tortuous   +----------+--------+--------+--------+-------------------------+---------+  CCA Distal 58       14                smooth and homogeneous               +----------+--------+--------+--------+-------------------------+---------+  ICA Prox   92       18       1-39%    heterogenous and calcific            +----------+--------+--------+--------+-------------------------+---------+  ICA Distal 125      35                                          tortuous   +----------+--------+--------+--------+-------------------------+---------+  ECA        78       12                                          shadowing  +----------+--------+--------+--------+-------------------------+---------+ +----------+--------+--------+----------------+-------------------+             PSV cm/s EDV cm/s Describe         Arm Pressure (mmHG)  +----------+--------+--------+----------------+-------------------+  Subclavian 105               Multiphasic, WNL                      +----------+--------+--------+----------------+-------------------+ +---------+--------+--+--------+--+---------+  Vertebral PSV cm/s 59 EDV cm/s 14 Antegrade  +---------+--------+--+--------+--+---------+   Summary: Right Carotid: Velocities in the right ICA are consistent with a  40-59%                stenosis. Left Carotid: Velocities in the left ICA are consistent with a 1-39% stenosis. Vertebrals:  Left vertebral artery demonstrates antegrade flow. Right vertebral              artery was not visualized. Subclavians: Normal flow hemodynamics were seen in bilateral subclavian              arteries. *See table(s) above for measurements and observations.  Preliminary    CT Maxillofacial Wo Contrast  Result Date: 02/15/2022 CLINICAL DATA:  Facial trauma, blunt.  Multiple falls. EXAM: CT MAXILLOFACIAL WITHOUT CONTRAST TECHNIQUE: Multidetector CT imaging of the maxillofacial structures was performed. Multiplanar CT image reconstructions were also generated. RADIATION DOSE REDUCTION: This exam was performed according to the departmental dose-optimization program which includes automated exposure control, adjustment of the mA and/or kV according to patient size and/or use of iterative reconstruction technique. COMPARISON:  None. FINDINGS: Osseous: Fracture noted through the inferior anterior wall of the right maxillary sinus. Orbits: Mildly depressed fracture noted through the floor of the right orbit. No muscular entrapment. Sinuses: Blood within the right maxillary sinus and scattered right ethmoid air cells. Soft tissues: Negative Limited intracranial: See head CT report IMPRESSION: Fracture through the anterior inferior wall of the right maxillary sinus. Fracture through the right orbital floor with mild depression. Electronically Signed   By: Rolm Baptise M.D.   On: 02/15/2022 23:15    ROS Blood pressure (!) 148/87, pulse 73, temperature 98.7 F (37.1 C), temperature source Oral, resp. rate 20, SpO2 97 %. Physical Exam HENT:     Head: Normocephalic.     Nose: Nose normal.     Mouth/Throat:     Mouth: Mucous membranes are moist.  Eyes:     Extraocular Movements: Extraocular movements intact.     Conjunctiva/sclera: Conjunctivae normal.     Pupils: Pupils are equal, round,  and reactive to light.     Comments: Slight bruising below eye. Vision intact and no diplopia  Musculoskeletal:     Cervical back: Normal range of motion.  Neurological:     Mental Status: She is alert.      Assessment/Plan: Right orbital fracture- she has no diplopia or vision changes. The fractures are nondisplaced and she will need no further follow up or intervention.   Melissa Montane 02/17/2022, 10:21 AM

## 2022-02-17 NOTE — Assessment & Plan Note (Signed)
-   Continue antihypertensives as above.  Outpatient follow-up with PCP.

## 2022-02-17 NOTE — Assessment & Plan Note (Signed)
Syncope Maxillary fracture Right orbital fracture -Presented with fall/syncope resulting in maxillary and right orbital fracture.  Imaging studies as above. -Carotid duplex showed no significant carotid stenosis.  Right ICA stenosis of 40 to 59% and left ICA stenosis of 1 to 39%.  Echo showed EF of 70 to 75% with grade 1 diastolic dysfunction. -ENT evaluated the patient and recommended conservative management with no further ENT follow-up and has cleared the patient for discharge.  Tolerated PT well.  She will be discharged back to independent living facility today.

## 2022-02-17 NOTE — Progress Notes (Signed)
°  Echocardiogram 2D Echocardiogram has been performed.  Darlina Sicilian M 02/17/2022, 8:15 AM

## 2022-02-17 NOTE — Hospital Course (Signed)
Generalized-year-old female with history of GI bleed, nonobstructive CAD, anxiety, panic attacks, depression, asthma, BPPV, chronic diastolic CHF, chronic lower back pain, COPD, [name], diverticular disease, fibromyalgia, GERD, hypertension, recurrent UTI, pleural plaque due to asbestosis, OSA on CPAP, osteoarthritis presented with fall/syncopal episode.  Patient did not have any recollection of the event.  She hit herself in her right periorbital area.  On presentation, vitals were stable.  Blood work was almost unremarkable.  CT of the head was negative for any acute intracranial abnormity.  CT C-spine showed DDD and facet disease but no acute abnormality.  CT maxillofacial without contrast showed a fracture through the anterior inferior wall of the right maxillary sinus, fracture to the right orbital floor with mild depression.  During the hospitalization, carotid ultrasound and 2D echo were unremarkable.  ENT evaluated the patient and recommended conservative management with no further ENT follow-up and has cleared the patient for discharge.  Tolerated PT well.  She will be discharged back to independent living facility today.

## 2022-02-17 NOTE — Evaluation (Signed)
Physical Therapy Evaluation Patient Details Name: Linda [name] MRN: [id number] DOB: 08-Jun-1946 Today's Date: 02/17/2022  History of Present Illness  76 yo female admitted with fall, facial fx. Hx of frequent falls, BPPV, COPD, chronic bil knee pain due to arthritis, chronic low back pain, osteoporosis, CHF, recurrent UTI, syncope, RBBB, fibromyalgia  Clinical Impression  On eval, pt was Supv for mobility. She walked ~300 feet with a RW. Pt denied dizziness. No buckling of LEs observed during session. Pt reports she has been using a rollator since falls begin about 4-5 weeks ago. In the last week, she has fallen 6-8 times, by her count. She denies falling this last time but she has no recollection of how she sustained the facial fx. O2 97% on RA. Discussed d/c plan-she plans to return to her [facility] apt. She is agreeable to HHPT f/u. Recommend RW use at all times for ambulation safety. Plan is for d/c home later today per RN.        Recommendations for follow up therapy are one component of a multi-disciplinary discharge planning process, led by the attending physician.  Recommendations may be updated based on patient status, additional functional criteria and insurance authorization.  Follow Up Recommendations Home health PT    Assistance Recommended at Discharge PRN  Patient can return home with the following  Assist for transportation    Equipment Recommendations None recommended by PT (pt has rollator;she declines standard RW)  Recommendations for Other Services       Functional Status Assessment Patient has had a recent decline in their functional status and demonstrates the ability to make significant improvements in function in a reasonable and predictable amount of time.     Precautions / Restrictions Precautions Precautions: Fall Precaution Comments: frequent falls Restrictions Weight Bearing Restrictions: No      Mobility  Bed Mobility Overal bed mobility:  Modified Independent                  Transfers Overall transfer level: Needs assistance Equipment used: Rolling walker (2 wheels) Transfers: Sit to/from Stand Sit to Stand: Supervision           General transfer comment: Cues for safety, hand placement.    Ambulation/Gait Ambulation/Gait assistance: Supervision Gait Distance (Feet): 300 Feet Assistive device: Rolling walker (2 wheels) Gait Pattern/deviations: Step-through pattern, Decreased stride length       General Gait Details: Supv for safety. No LOB with RW. Did not observe any knee buckling. Pt tolerated distance well. O2 97% on RA  Stairs            Wheelchair Mobility    Modified Rankin (Stroke Patients Only)       Balance Overall balance assessment: History of Falls, Needs assistance         Standing balance support: During functional activity, Bilateral upper extremity supported Standing balance-Leahy Scale: Good                               Pertinent Vitals/Pain Pain Assessment Pain Assessment: 0-10 Pain Score: 7  Pain Location: neck, fack, knees Pain Descriptors / Indicators: Discomfort, Sore, Aching Pain Intervention(s): Monitored during session    Home Living Family/patient expects to be discharged to:: Private residence Living Arrangements: Alone Available Help at Discharge: Family;Friend(s) Type of Home: Independent living facility Home Access: Level entry       Home Layout: One level Home Equipment: Rollator (4 wheels);Cane -  single point;Shower seat      Prior Function Prior Level of Function : Independent/Modified Independent             Mobility Comments: uses rollator for ambulation. pt reported she walks to the dining room ADLs Comments: no assist for bathinig, dressing. she walks to dining room for meals. she has a Multimedia programmer Dominance        Extremity/Trunk Assessment   Upper Extremity Assessment Upper Extremity  Assessment: Overall WFL for tasks assessed    Lower Extremity Assessment Lower Extremity Assessment: Generalized weakness    Cervical / Trunk Assessment Cervical / Trunk Assessment: Normal  Communication   Communication: No difficulties  Cognition Arousal/Alertness: Awake/alert Behavior During Therapy: WFL for tasks assessed/performed Overall Cognitive Status: Within Functional Limits for tasks assessed                                          General Comments      Exercises     Assessment/Plan    PT Assessment Patient needs continued PT services  PT Problem List Decreased strength;Pain;Decreased mobility       PT Treatment Interventions DME instruction;Gait training;Therapeutic exercise;Balance training;Functional mobility training;Therapeutic activities;Patient/family education    PT Goals (Current goals can be found in the Care Plan section)  Acute Rehab PT Goals Patient Stated Goal: stronger legs. less falls. PT Goal Formulation: With patient Time For Goal Achievement: 03/03/22 Potential to Achieve Goals: Good    Frequency Min 3X/week     Co-evaluation               AM-PAC PT "6 Clicks" Mobility  Outcome Measure Help needed turning from your back to your side while in a flat bed without using bedrails?: None Help needed moving from lying on your back to sitting on the side of a flat bed without using bedrails?: None Help needed moving to and from a bed to a chair (including a wheelchair)?: A Little Help needed standing up from a chair using your arms (e.g., wheelchair or bedside chair)?: A Little Help needed to walk in hospital room?: A Little Help needed climbing 3-5 steps with a railing? : A Little 6 Click Score: 20    End of Session Equipment Utilized During Treatment: Gait belt Activity Tolerance: Patient tolerated treatment well Patient left: in bed;with call bell/phone within reach   PT Visit Diagnosis: History of falling  (Z91.81);Repeated falls (R29.6);Muscle weakness (generalized) (M62.81)    Time: 6578-4696 PT Time Calculation (min) (ACUTE ONLY): 31 min   Charges:   PT Evaluation $PT Eval Moderate Complexity: 1 Mod PT Treatments $Gait Training: 8-22 mins           Doreatha Massed, PT Acute Rehabilitation  Office: [phone] Pager: [phone]

## 2022-02-17 NOTE — Assessment & Plan Note (Signed)
-   Currently stable.  Continue home regimen.  Outpatient follow-up.

## 2022-02-17 NOTE — Assessment & Plan Note (Signed)
-   Nonobstructive.  Continue aspirin, beta-blocker, statin.  Outpatient follow-up with cardiology

## 2022-02-17 NOTE — Assessment & Plan Note (Signed)
-   Treated with replacement.  No labs today.  Outpatient follow-up.

## 2022-02-17 NOTE — Assessment & Plan Note (Signed)
-  Continue home regimen.  Outpatient follow-up with PCP/psychiatry -Explained to the patient that patient will need to follow-up with PCP regarding adjustment of her current medications including Xanax because these meds might predispose her to recurrent falls.  Patient is currently hesitant to change any of her medications.

## 2022-02-18 ENCOUNTER — Other Ambulatory Visit: Payer: Self-pay

## 2022-02-19 ENCOUNTER — Telehealth: Payer: Self-pay | Admitting: Family Medicine

## 2022-02-19 NOTE — Telephone Encounter (Signed)
Linda [name] ([facility]) for outpatient therapy services) calling  Pt had a fall & fractured in her face. Pt went to ED. Needing a verbal order for speech & occupational & physical therapy. She will fax orders to our office.  Lelon Frohlich [phone]

## 2022-02-19 NOTE — Telephone Encounter (Signed)
Pt last seen 01/11/22. Please review and advise

## 2022-02-19 NOTE — Telephone Encounter (Signed)
Yes okay 

## 2022-02-20 ENCOUNTER — Telehealth: Payer: Self-pay | Admitting: *Deleted

## 2022-02-20 NOTE — Telephone Encounter (Signed)
Oral swab drug screen was consistent for prescribed medications. Alcohol not performed due to the specimen was too viscous. ?

## 2022-02-20 NOTE — Telephone Encounter (Signed)
VO given to [place]. Written orders received for signature ?

## 2022-02-20 NOTE — Telephone Encounter (Signed)
Forms signed and faxed back to number provided

## 2022-02-21 DIAGNOSIS — R1312 Dysphagia, oropharyngeal phase: Secondary | ICD-10-CM | POA: Diagnosis not present

## 2022-02-21 DIAGNOSIS — R2689 Other abnormalities of gait and mobility: Secondary | ICD-10-CM | POA: Diagnosis not present

## 2022-02-21 DIAGNOSIS — M6281 Muscle weakness (generalized): Secondary | ICD-10-CM | POA: Diagnosis not present

## 2022-02-21 DIAGNOSIS — R41841 Cognitive communication deficit: Secondary | ICD-10-CM | POA: Diagnosis not present

## 2022-02-21 DIAGNOSIS — R296 Repeated falls: Secondary | ICD-10-CM | POA: Diagnosis not present

## 2022-02-21 DIAGNOSIS — R488 Other symbolic dysfunctions: Secondary | ICD-10-CM | POA: Diagnosis not present

## 2022-02-22 DIAGNOSIS — R1312 Dysphagia, oropharyngeal phase: Secondary | ICD-10-CM | POA: Diagnosis not present

## 2022-02-22 DIAGNOSIS — R41841 Cognitive communication deficit: Secondary | ICD-10-CM | POA: Diagnosis not present

## 2022-02-22 DIAGNOSIS — R2689 Other abnormalities of gait and mobility: Secondary | ICD-10-CM | POA: Diagnosis not present

## 2022-02-22 DIAGNOSIS — M6281 Muscle weakness (generalized): Secondary | ICD-10-CM | POA: Diagnosis not present

## 2022-02-22 DIAGNOSIS — R488 Other symbolic dysfunctions: Secondary | ICD-10-CM | POA: Diagnosis not present

## 2022-02-22 DIAGNOSIS — R296 Repeated falls: Secondary | ICD-10-CM | POA: Diagnosis not present

## 2022-02-25 DIAGNOSIS — R488 Other symbolic dysfunctions: Secondary | ICD-10-CM | POA: Diagnosis not present

## 2022-02-25 DIAGNOSIS — M6281 Muscle weakness (generalized): Secondary | ICD-10-CM | POA: Diagnosis not present

## 2022-02-25 DIAGNOSIS — R1312 Dysphagia, oropharyngeal phase: Secondary | ICD-10-CM | POA: Diagnosis not present

## 2022-02-25 DIAGNOSIS — R2689 Other abnormalities of gait and mobility: Secondary | ICD-10-CM | POA: Diagnosis not present

## 2022-02-25 DIAGNOSIS — R41841 Cognitive communication deficit: Secondary | ICD-10-CM | POA: Diagnosis not present

## 2022-02-25 DIAGNOSIS — R296 Repeated falls: Secondary | ICD-10-CM | POA: Diagnosis not present

## 2022-02-26 ENCOUNTER — Other Ambulatory Visit: Payer: Self-pay

## 2022-02-26 ENCOUNTER — Encounter (HOSPITAL_COMMUNITY): Payer: Self-pay | Admitting: Radiology

## 2022-02-26 ENCOUNTER — Emergency Department (HOSPITAL_COMMUNITY): Payer: Medicare Other

## 2022-02-26 ENCOUNTER — Observation Stay (HOSPITAL_COMMUNITY): Payer: Medicare Other

## 2022-02-26 ENCOUNTER — Observation Stay (HOSPITAL_COMMUNITY)
Admission: EM | Admit: 2022-02-26 | Discharge: 2022-02-27 | Disposition: A | Discharge status: Home Health | Payer: Medicare Other | Attending: Family Medicine | Admitting: Family Medicine

## 2022-02-26 ENCOUNTER — Telehealth: Payer: Self-pay

## 2022-02-26 DIAGNOSIS — R479 Unspecified speech disturbances: Secondary | ICD-10-CM | POA: Diagnosis not present

## 2022-02-26 DIAGNOSIS — D500 Iron deficiency anemia secondary to blood loss (chronic): Secondary | ICD-10-CM | POA: Insufficient documentation

## 2022-02-26 DIAGNOSIS — Z20822 Contact with and (suspected) exposure to covid-19: Secondary | ICD-10-CM | POA: Insufficient documentation

## 2022-02-26 DIAGNOSIS — G928 Other toxic encephalopathy: Secondary | ICD-10-CM | POA: Diagnosis not present

## 2022-02-26 DIAGNOSIS — M545 Low back pain, unspecified: Secondary | ICD-10-CM | POA: Diagnosis not present

## 2022-02-26 DIAGNOSIS — R9431 Abnormal electrocardiogram [ECG] [EKG]: Secondary | ICD-10-CM | POA: Diagnosis not present

## 2022-02-26 DIAGNOSIS — Z86711 Personal history of pulmonary embolism: Secondary | ICD-10-CM | POA: Diagnosis not present

## 2022-02-26 DIAGNOSIS — E785 Hyperlipidemia, unspecified: Secondary | ICD-10-CM | POA: Insufficient documentation

## 2022-02-26 DIAGNOSIS — R4701 Aphasia: Secondary | ICD-10-CM | POA: Insufficient documentation

## 2022-02-26 DIAGNOSIS — I10 Essential (primary) hypertension: Secondary | ICD-10-CM | POA: Diagnosis not present

## 2022-02-26 DIAGNOSIS — R1312 Dysphagia, oropharyngeal phase: Secondary | ICD-10-CM | POA: Diagnosis not present

## 2022-02-26 DIAGNOSIS — J449 Chronic obstructive pulmonary disease, unspecified: Secondary | ICD-10-CM | POA: Diagnosis not present

## 2022-02-26 DIAGNOSIS — M438X4 Other specified deforming dorsopathies, thoracic region: Secondary | ICD-10-CM | POA: Diagnosis not present

## 2022-02-26 DIAGNOSIS — I700 Atherosclerosis of aorta: Secondary | ICD-10-CM | POA: Insufficient documentation

## 2022-02-26 DIAGNOSIS — R0902 Hypoxemia: Secondary | ICD-10-CM | POA: Diagnosis not present

## 2022-02-26 DIAGNOSIS — R4182 Altered mental status, unspecified: Secondary | ICD-10-CM | POA: Diagnosis not present

## 2022-02-26 DIAGNOSIS — E041 Nontoxic single thyroid nodule: Secondary | ICD-10-CM | POA: Insufficient documentation

## 2022-02-26 DIAGNOSIS — I639 Cerebral infarction, unspecified: Secondary | ICD-10-CM | POA: Diagnosis not present

## 2022-02-26 DIAGNOSIS — Z79899 Other long term (current) drug therapy: Secondary | ICD-10-CM | POA: Insufficient documentation

## 2022-02-26 DIAGNOSIS — F32A Depression, unspecified: Secondary | ICD-10-CM | POA: Insufficient documentation

## 2022-02-26 DIAGNOSIS — Z91199 Patient's noncompliance with other medical treatment and regimen due to unspecified reason: Secondary | ICD-10-CM | POA: Insufficient documentation

## 2022-02-26 DIAGNOSIS — K5790 Diverticulosis of intestine, part unspecified, without perforation or abscess without bleeding: Secondary | ICD-10-CM | POA: Insufficient documentation

## 2022-02-26 DIAGNOSIS — I651 Occlusion and stenosis of basilar artery: Secondary | ICD-10-CM | POA: Insufficient documentation

## 2022-02-26 DIAGNOSIS — M47816 Spondylosis without myelopathy or radiculopathy, lumbar region: Secondary | ICD-10-CM | POA: Diagnosis not present

## 2022-02-26 DIAGNOSIS — R41841 Cognitive communication deficit: Secondary | ICD-10-CM | POA: Diagnosis not present

## 2022-02-26 DIAGNOSIS — I4510 Unspecified right bundle-branch block: Secondary | ICD-10-CM | POA: Insufficient documentation

## 2022-02-26 DIAGNOSIS — I6523 Occlusion and stenosis of bilateral carotid arteries: Secondary | ICD-10-CM | POA: Diagnosis not present

## 2022-02-26 DIAGNOSIS — M47812 Spondylosis without myelopathy or radiculopathy, cervical region: Secondary | ICD-10-CM | POA: Insufficient documentation

## 2022-02-26 DIAGNOSIS — R4781 Slurred speech: Secondary | ICD-10-CM | POA: Insufficient documentation

## 2022-02-26 DIAGNOSIS — Z8673 Personal history of transient ischemic attack (TIA), and cerebral infarction without residual deficits: Secondary | ICD-10-CM | POA: Diagnosis not present

## 2022-02-26 DIAGNOSIS — W19XXXA Unspecified fall, initial encounter: Secondary | ICD-10-CM | POA: Diagnosis present

## 2022-02-26 DIAGNOSIS — Z8719 Personal history of other diseases of the digestive system: Secondary | ICD-10-CM | POA: Insufficient documentation

## 2022-02-26 DIAGNOSIS — R29818 Other symptoms and signs involving the nervous system: Secondary | ICD-10-CM | POA: Insufficient documentation

## 2022-02-26 DIAGNOSIS — G43909 Migraine, unspecified, not intractable, without status migrainosus: Secondary | ICD-10-CM | POA: Insufficient documentation

## 2022-02-26 DIAGNOSIS — M797 Fibromyalgia: Secondary | ICD-10-CM | POA: Insufficient documentation

## 2022-02-26 DIAGNOSIS — M4316 Spondylolisthesis, lumbar region: Secondary | ICD-10-CM | POA: Insufficient documentation

## 2022-02-26 DIAGNOSIS — M47817 Spondylosis without myelopathy or radiculopathy, lumbosacral region: Secondary | ICD-10-CM | POA: Diagnosis not present

## 2022-02-26 DIAGNOSIS — S22088A Other fracture of T11-T12 vertebra, initial encounter for closed fracture: Secondary | ICD-10-CM | POA: Diagnosis not present

## 2022-02-26 DIAGNOSIS — Z79891 Long term (current) use of opiate analgesic: Secondary | ICD-10-CM | POA: Insufficient documentation

## 2022-02-26 DIAGNOSIS — R2689 Other abnormalities of gait and mobility: Secondary | ICD-10-CM | POA: Diagnosis not present

## 2022-02-26 DIAGNOSIS — M2550 Pain in unspecified joint: Secondary | ICD-10-CM | POA: Insufficient documentation

## 2022-02-26 DIAGNOSIS — G9340 Encephalopathy, unspecified: Secondary | ICD-10-CM | POA: Diagnosis not present

## 2022-02-26 DIAGNOSIS — I5032 Chronic diastolic (congestive) heart failure: Secondary | ICD-10-CM | POA: Diagnosis present

## 2022-02-26 DIAGNOSIS — M6281 Muscle weakness (generalized): Secondary | ICD-10-CM | POA: Insufficient documentation

## 2022-02-26 DIAGNOSIS — G8929 Other chronic pain: Secondary | ICD-10-CM | POA: Insufficient documentation

## 2022-02-26 DIAGNOSIS — I2510 Atherosclerotic heart disease of native coronary artery without angina pectoris: Secondary | ICD-10-CM | POA: Diagnosis not present

## 2022-02-26 DIAGNOSIS — J329 Chronic sinusitis, unspecified: Secondary | ICD-10-CM | POA: Diagnosis not present

## 2022-02-26 DIAGNOSIS — Z8781 Personal history of (healed) traumatic fracture: Secondary | ICD-10-CM | POA: Insufficient documentation

## 2022-02-26 DIAGNOSIS — G4733 Obstructive sleep apnea (adult) (pediatric): Secondary | ICD-10-CM | POA: Diagnosis not present

## 2022-02-26 DIAGNOSIS — R0989 Other specified symptoms and signs involving the circulatory and respiratory systems: Secondary | ICD-10-CM | POA: Insufficient documentation

## 2022-02-26 DIAGNOSIS — Z8249 Family history of ischemic heart disease and other diseases of the circulatory system: Secondary | ICD-10-CM | POA: Insufficient documentation

## 2022-02-26 DIAGNOSIS — S22009A Unspecified fracture of unspecified thoracic vertebra, initial encounter for closed fracture: Secondary | ICD-10-CM | POA: Diagnosis present

## 2022-02-26 DIAGNOSIS — Z9981 Dependence on supplemental oxygen: Secondary | ICD-10-CM | POA: Insufficient documentation

## 2022-02-26 DIAGNOSIS — J948 Other specified pleural conditions: Secondary | ICD-10-CM | POA: Insufficient documentation

## 2022-02-26 DIAGNOSIS — R296 Repeated falls: Secondary | ICD-10-CM | POA: Insufficient documentation

## 2022-02-26 DIAGNOSIS — R471 Dysarthria and anarthria: Secondary | ICD-10-CM | POA: Insufficient documentation

## 2022-02-26 DIAGNOSIS — S22080A Wedge compression fracture of T11-T12 vertebra, initial encounter for closed fracture: Secondary | ICD-10-CM | POA: Diagnosis not present

## 2022-02-26 DIAGNOSIS — R410 Disorientation, unspecified: Secondary | ICD-10-CM | POA: Diagnosis not present

## 2022-02-26 DIAGNOSIS — R1310 Dysphagia, unspecified: Secondary | ICD-10-CM | POA: Insufficient documentation

## 2022-02-26 DIAGNOSIS — M5136 Other intervertebral disc degeneration, lumbar region: Secondary | ICD-10-CM | POA: Diagnosis not present

## 2022-02-26 DIAGNOSIS — Z7982 Long term (current) use of aspirin: Secondary | ICD-10-CM | POA: Diagnosis not present

## 2022-02-26 DIAGNOSIS — M25561 Pain in right knee: Secondary | ICD-10-CM | POA: Insufficient documentation

## 2022-02-26 DIAGNOSIS — I110 Hypertensive heart disease with heart failure: Secondary | ICD-10-CM | POA: Diagnosis not present

## 2022-02-26 DIAGNOSIS — R2681 Unsteadiness on feet: Secondary | ICD-10-CM | POA: Insufficient documentation

## 2022-02-26 DIAGNOSIS — I6501 Occlusion and stenosis of right vertebral artery: Secondary | ICD-10-CM | POA: Insufficient documentation

## 2022-02-26 DIAGNOSIS — S0083XA Contusion of other part of head, initial encounter: Secondary | ICD-10-CM | POA: Insufficient documentation

## 2022-02-26 DIAGNOSIS — R32 Unspecified urinary incontinence: Secondary | ICD-10-CM | POA: Insufficient documentation

## 2022-02-26 DIAGNOSIS — M5137 Other intervertebral disc degeneration, lumbosacral region: Secondary | ICD-10-CM | POA: Insufficient documentation

## 2022-02-26 DIAGNOSIS — M810 Age-related osteoporosis without current pathological fracture: Secondary | ICD-10-CM | POA: Insufficient documentation

## 2022-02-26 DIAGNOSIS — M4317 Spondylolisthesis, lumbosacral region: Secondary | ICD-10-CM | POA: Insufficient documentation

## 2022-02-26 DIAGNOSIS — R000 Tachycardia, unspecified: Secondary | ICD-10-CM | POA: Diagnosis not present

## 2022-02-26 DIAGNOSIS — K219 Gastro-esophageal reflux disease without esophagitis: Secondary | ICD-10-CM | POA: Insufficient documentation

## 2022-02-26 DIAGNOSIS — F0390 Unspecified dementia without behavioral disturbance: Secondary | ICD-10-CM | POA: Insufficient documentation

## 2022-02-26 DIAGNOSIS — R488 Other symbolic dysfunctions: Secondary | ICD-10-CM | POA: Diagnosis not present

## 2022-02-26 DIAGNOSIS — R5381 Other malaise: Secondary | ICD-10-CM | POA: Insufficient documentation

## 2022-02-26 DIAGNOSIS — J439 Emphysema, unspecified: Secondary | ICD-10-CM | POA: Diagnosis not present

## 2022-02-26 DIAGNOSIS — Z87440 Personal history of urinary (tract) infections: Secondary | ICD-10-CM | POA: Insufficient documentation

## 2022-02-26 DIAGNOSIS — H8110 Benign paroxysmal vertigo, unspecified ear: Secondary | ICD-10-CM | POA: Insufficient documentation

## 2022-02-26 DIAGNOSIS — R0609 Other forms of dyspnea: Secondary | ICD-10-CM | POA: Insufficient documentation

## 2022-02-26 DIAGNOSIS — M1711 Unilateral primary osteoarthritis, right knee: Secondary | ICD-10-CM

## 2022-02-26 DIAGNOSIS — Z823 Family history of stroke: Secondary | ICD-10-CM | POA: Insufficient documentation

## 2022-02-26 DIAGNOSIS — Z9181 History of falling: Secondary | ICD-10-CM | POA: Insufficient documentation

## 2022-02-26 DIAGNOSIS — M1712 Unilateral primary osteoarthritis, left knee: Secondary | ICD-10-CM

## 2022-02-26 DIAGNOSIS — M25562 Pain in left knee: Secondary | ICD-10-CM | POA: Insufficient documentation

## 2022-02-26 DIAGNOSIS — M40204 Unspecified kyphosis, thoracic region: Secondary | ICD-10-CM | POA: Diagnosis not present

## 2022-02-26 DIAGNOSIS — I6503 Occlusion and stenosis of bilateral vertebral arteries: Secondary | ICD-10-CM | POA: Diagnosis not present

## 2022-02-26 LAB — DIFFERENTIAL
Abs Immature Granulocytes: 0.02 10*3/uL (ref 0.00–0.07)
Basophils Absolute: 0 10*3/uL (ref 0.0–0.1)
Basophils Relative: 1 %
Eosinophils Absolute: 0.1 10*3/uL (ref 0.0–0.5)
Eosinophils Relative: 1 %
Immature Granulocytes: 0 %
Lymphocytes Relative: 23 %
Lymphs Abs: 1.9 10*3/uL (ref 0.7–4.0)
Monocytes Absolute: 0.5 10*3/uL (ref 0.1–1.0)
Monocytes Relative: 7 %
Neutro Abs: 5.6 10*3/uL (ref 1.7–7.7)
Neutrophils Relative %: 68 %

## 2022-02-26 LAB — CBG MONITORING, ED: Glucose-Capillary: 102 mg/dL — ABNORMAL HIGH (ref 70–99)

## 2022-02-26 LAB — COMPREHENSIVE METABOLIC PANEL
ALT: 12 U/L (ref 0–44)
AST: 22 U/L (ref 15–41)
Albumin: 3.6 g/dL (ref 3.5–5.0)
Alkaline Phosphatase: 86 U/L (ref 38–126)
Anion gap: 8 (ref 5–15)
BUN: 9 mg/dL (ref 8–23)
CO2: 26 mmol/L (ref 22–32)
Calcium: 8.8 mg/dL — ABNORMAL LOW (ref 8.9–10.3)
Chloride: 105 mmol/L (ref 98–111)
Creatinine, Ser: 0.83 mg/dL (ref 0.44–1.00)
GFR, Estimated: >60 mL/min (ref >60)
Glucose, Bld: 103 mg/dL — ABNORMAL HIGH (ref 70–99)
Potassium: 3.9 mmol/L (ref 3.5–5.1)
Sodium: 139 mmol/L (ref 135–145)
Total Bilirubin: 0.4 mg/dL (ref 0.3–1.2)
Total Protein: 6.6 g/dL (ref 6.5–8.1)

## 2022-02-26 LAB — I-STAT CHEM 8, ED
BUN: 8 mg/dL (ref 8–23)
Calcium, Ion: 1.14 mmol/L — ABNORMAL LOW (ref 1.15–1.40)
Chloride: 103 mmol/L (ref 98–111)
Creatinine, Ser: 0.7 mg/dL (ref 0.44–1.00)
Glucose, Bld: 102 mg/dL — ABNORMAL HIGH (ref 70–99)
HCT: 35 % — ABNORMAL LOW (ref 36.0–46.0)
Hemoglobin: 11.9 g/dL — ABNORMAL LOW (ref 12.0–15.0)
Potassium: 3.9 mmol/L (ref 3.5–5.1)
Sodium: 141 mmol/L (ref 135–145)
TCO2: 26 mmol/L (ref 22–32)

## 2022-02-26 LAB — CBC
HCT: 36.1 % (ref 36.0–46.0)
Hemoglobin: 11.8 g/dL — ABNORMAL LOW (ref 12.0–15.0)
MCH: 29 pg (ref 26.0–34.0)
MCHC: 32.7 g/dL (ref 30.0–36.0)
MCV: 88.7 fL (ref 80.0–100.0)
Platelets: 222 10*3/uL (ref 150–400)
RBC: 4.07 MIL/uL (ref 3.87–5.11)
RDW: 12.1 % (ref 11.5–15.5)
WBC: 8.2 10*3/uL (ref 4.0–10.5)
nRBC: 0 % (ref 0.0–0.2)

## 2022-02-26 LAB — URINALYSIS, ROUTINE W REFLEX MICROSCOPIC
Bacteria, UA: NONE SEEN
Bilirubin Urine: NEGATIVE
Glucose, UA: NEGATIVE mg/dL
Hgb urine dipstick: NEGATIVE
Ketones, ur: NEGATIVE mg/dL
Nitrite: NEGATIVE
Protein, ur: NEGATIVE mg/dL
Specific Gravity, Urine: 1.034 — ABNORMAL HIGH (ref 1.005–1.030)
pH: 7 (ref 5.0–8.0)

## 2022-02-26 LAB — PROTIME-INR
INR: 1 (ref 0.8–1.2)
Prothrombin Time: 13.5 seconds (ref 11.4–15.2)

## 2022-02-26 LAB — APTT: aPTT: 25 seconds (ref 24–36)

## 2022-02-26 MED ORDER — SODIUM CHLORIDE 0.9% FLUSH
3.0000 mL | Freq: Once | INTRAVENOUS | Status: AC
  Administered 2022-02-26: 3 mL via INTRAVENOUS

## 2022-02-26 MED ORDER — OXYCODONE-ACETAMINOPHEN 10-325 MG PO TABS: 1.0000 | ORAL_TABLET | ORAL | 0 refills | Status: DC

## 2022-02-26 MED ORDER — OXYCODONE-ACETAMINOPHEN 5-325 MG PO TABS
1.0000 | ORAL_TABLET | Freq: Once | ORAL | Status: AC
  Administered 2022-02-26: 1 via ORAL
  Filled 2022-02-26: qty 1

## 2022-02-26 MED ORDER — MORPHINE SULFATE (PF) 4 MG/ML IV SOLN
4.0000 mg | Freq: Once | INTRAVENOUS | Status: AC
  Administered 2022-02-26: 4 mg via INTRAVENOUS
  Filled 2022-02-26: qty 1

## 2022-02-26 MED ORDER — IOHEXOL 350 MG/ML SOLN
100.0000 mL | Freq: Once | INTRAVENOUS | Status: AC | PRN
  Administered 2022-02-26: 100 mL via INTRAVENOUS

## 2022-02-26 NOTE — Assessment & Plan Note (Signed)
Did not tolerate anticoagulation in the past resulting in bleeding ?

## 2022-02-26 NOTE — ED Notes (Signed)
Patient transported to CT 

## 2022-02-26 NOTE — Assessment & Plan Note (Signed)
Chronic stable continue home medications ?

## 2022-02-26 NOTE — ED Provider Notes (Signed)
[facility] EMERGENCY DEPARTMENT Provider Note   CSN: [id number] Arrival date & time: 02/26/22  1147  An emergency department physician performed an initial assessment on this suspected stroke patient at 1145.  History  Chief Complaint  Patient presents with   Code Stroke    Linda [name] is a 76 y.o. female.  HPI She presented from her nursing care facility where she was receiving therapy, for difficulty speaking and confusion.  She arrived as code stroke.  I saw her at the bridge on arrival.  At this point the patient was verbalizing, speech only minimally halting but answers questions appropriately.  Neuro hospitalist at the bedside as well on arrival.    Home Medications Prior to Admission medications   Medication Sig Start Date End Date Taking? Authorizing Provider  albuterol (VENTOLIN HFA) 108 (90 Base) MCG/ACT inhaler Inhale 1-2 puffs into the lungs every 6 (six) hours as needed for wheezing or shortness of breath. 12/28/20  Yes McGowen, Adrian Blackwater, MD  ALPRAZolam Duanne Moron) 1 MG tablet TAKE 1 TABLET THREE TIMES DAILY AS NEEDED FOR ANXIETY. Patient taking differently: Take 1 mg by mouth 3 (three) times daily as needed for anxiety. 10/11/21  Yes McGowen, Adrian Blackwater, MD  aspirin 81 MG tablet Take 81 mg by mouth at bedtime.   Yes [provider]  diclofenac Sodium (VOLTAREN) 1 % GEL Apply 2 g topically 4 (four) times daily. 4 times a day to both knees Patient taking differently: Apply 2 g topically 4 (four) times daily as needed (knee pain). 05/11/21  Yes Bayard Hugger, NP  DULoxetine (CYMBALTA) 30 MG capsule TAKE 1 CAPSULE ONCE DAILY ALONG WITH '60MG'$  CAPSULE FOR TOTAL '90MG'$  DAILY. Patient taking differently: Take 30 mg by mouth daily. Take '30mg'$  with '60mg'$  to equal '90mg'$  oral daily in the evening. 01/23/22  Yes McGowen, Adrian Blackwater, MD  DULoxetine (CYMBALTA) 60 MG capsule TAKE 1 CAPSULE A DAY TO BE COMBINED WITH '30MG'$  CAPSULE Patient taking differently: Take 60 mg  by mouth daily. Take '60mg'$  with '30mg'$  to equal '90mg'$  oral daily in the evening. 01/23/22  Yes McGowen, Adrian Blackwater, MD  EPINEPHrine 0.3 mg/0.3 mL IJ SOAJ injection Inject 0.3 mg into the muscle as needed for anaphylaxis. 12/28/20  Yes McGowen, Adrian Blackwater, MD  fluconazole (DIFLUCAN) 100 MG tablet TAKE (1) TABLET DAILY AS NEEDED FOR THRUSH Patient taking differently: Take 100 mg by mouth daily as needed (thrush symptoms). 12/31/21  Yes McGowen, Adrian Blackwater, MD  furosemide (LASIX) 40 MG tablet Take 1 tablet (40 mg total) by mouth daily as needed for fluid. 10/16/21  Yes Lelon Perla, MD  ipratropium (ATROVENT) 0.03 % nasal spray Place 2 sprays into both nostrils every 12 (twelve) hours as needed (allergies). 02/17/22  Yes Aline August, MD  isosorbide mononitrate (IMDUR) 30 MG 24 hr tablet Take 2 tablets (60 mg total) by mouth daily. TAKE 2 TABLETS IN THE AM AND 1 TABLET IN THE PM. Patient taking differently: Take 30-60 mg by mouth See admin instructions. Taking 2 tablets (60 mg) in the AM and 1 tablet  '30mg'$  in the evening 02/17/22  Yes Alekh, Kshitiz, MD  lamoTRIgine (LAMICTAL) 150 MG tablet TAKE 1 TABLET EACH DAY. Patient taking differently: Take 150 mg by mouth daily. 11/12/21  Yes McGowen, Adrian Blackwater, MD  methocarbamol (ROBAXIN) 500 MG tablet Take 1 tablet (500 mg total) by mouth every 6 (six) hours as needed for muscle spasms. 02/17/22  Yes Aline August, MD  metoprolol succinate (  TOPROL-XL) 100 MG 24 hr tablet take ONE tab daily, take additional 1/2 tab as needed FOR FOR BLOOD PRESSURE > 160/90 Patient taking differently: Take 50-100 mg by mouth See admin instructions. take '100mg'$  tab daily, take additional '50mg'$  as needed FOR FOR BLOOD PRESSURE > 160/90 12/12/21  Yes McGowen, Adrian Blackwater, MD  Multiple Vitamin (MULTIVITAMIN WITH MINERALS) TABS tablet Take 1 tablet by mouth daily.   Yes [provider]  nitroGLYCERIN (NITROSTAT) 0.4 MG SL tablet Place 1 tablet (0.4 mg total) under the tongue every 5 (five)  minutes as needed for chest pain (x 3 doses). 10/16/21  Yes Lelon Perla, MD  oxybutynin (DITROPAN) 5 MG tablet Take 1 tablet (5 mg total) by mouth 3 (three) times daily. 01/11/22  Yes McGowen, Adrian Blackwater, MD  oxyCODONE-acetaminophen (PERCOCET) 10-325 MG tablet Take 1 tablet by mouth See admin instructions. 1 tablet oral twice daily, may take an additional 2 tablets through out the day as needed for pain 02/26/22  Yes Meredith Staggers, MD  pantoprazole (PROTONIX) 40 MG tablet TAKE 1 TABLET BY MOUTH TWICE DAILY. Patient taking differently: Take 40 mg by mouth 2 (two) times daily. 10/16/21  Yes McGowen, Adrian Blackwater, MD  polyvinyl alcohol (LIQUIFILM TEARS) 1.4 % ophthalmic solution Place 1 drop into both eyes as needed for dry eyes.   Yes [provider]  rosuvastatin (CRESTOR) 20 MG tablet Take 1 tablet (20 mg total) by mouth every evening. 02/17/22  Yes Aline August, MD  traZODone (DESYREL) 50 MG tablet TAKE 2-4 TABLETS AT BEDTIME Patient taking differently: Take 150-200 mg by mouth at bedtime. 10/01/21  Yes McGowen, Adrian Blackwater, MD  donepezil (ARICEPT) 10 MG tablet Take 1 tablet (10 mg total) by mouth at bedtime. must have office visit Patient not taking: Reported on 02/16/2022 05/16/21   Tammi Sou, MD      Allergies    Abilify [aripiprazole], Bactrim [sulfamethoxazole-trimethoprim], and Penicillins    Review of Systems   Review of Systems  Physical Exam Updated Vital Signs BP (!) 167/127    Pulse 69    Temp 98 F (36.7 C) (Oral)    Resp 16    Wt 62.4 kg    SpO2 93%    BMI 23.61 kg/m  Physical Exam Vitals and nursing note reviewed.  Constitutional:      General: She is not in acute distress.    Appearance: She is well-developed. She is not ill-appearing, toxic-appearing or diaphoretic.  HENT:     Head: Normocephalic.     Comments: Subacute bruising is noted on the right cheek and right chin.  No trismus, no mandible deformity.    Right Ear: External ear normal.     Left  Ear: External ear normal.     Nose:     Comments: Tongue is slightly dry.    Mouth/Throat:     Mouth: Mucous membranes are moist.     Pharynx: No oropharyngeal exudate or posterior oropharyngeal erythema.  Eyes:     Conjunctiva/sclera: Conjunctivae normal.     Pupils: Pupils are equal, round, and reactive to light.  Neck:     Trachea: Phonation normal.  Cardiovascular:     Rate and Rhythm: Normal rate and regular rhythm.     Heart sounds: Normal heart sounds.  Pulmonary:     Effort: Pulmonary effort is normal.     Breath sounds: Normal breath sounds.  Abdominal:     Palpations: Abdomen is soft.     Tenderness:  There is no abdominal tenderness.  Musculoskeletal:        General: Normal range of motion.     Cervical back: Normal range of motion and neck supple.     Comments: Normal strength arms and legs bilaterally.  Skin:    General: Skin is warm and dry.  Neurological:     Mental Status: She is alert and oriented to person, place, and time.     Cranial Nerves: No cranial nerve deficit.     Sensory: No sensory deficit.     Motor: No abnormal muscle tone.     Coordination: Coordination normal.     Comments: Speech is minimally garbled.  No expressive or receptive aphasia  Psychiatric:        Mood and Affect: Mood normal.        Behavior: Behavior normal.    ED Results / Procedures / Treatments   Labs (all labs ordered are listed, but only abnormal results are displayed) Labs Reviewed  CBC - Abnormal; Notable for the following components:      Result Value   Hemoglobin 11.8 (*)    All other components within normal limits  COMPREHENSIVE METABOLIC PANEL - Abnormal; Notable for the following components:   Glucose, Bld 103 (*)    Calcium 8.8 (*)    All other components within normal limits  URINALYSIS, ROUTINE W REFLEX MICROSCOPIC - Abnormal; Notable for the following components:   Color, Urine STRAW (*)    Specific Gravity, Urine 1.034 (*)    Leukocytes,Ua SMALL (*)     Non Squamous Epithelial 0-5 (*)    All other components within normal limits  CBG MONITORING, ED - Abnormal; Notable for the following components:   Glucose-Capillary 102 (*)    All other components within normal limits  I-STAT CHEM 8, ED - Abnormal; Notable for the following components:   Glucose, Bld 102 (*)    Calcium, Ion 1.14 (*)    Hemoglobin 11.9 (*)    HCT 35.0 (*)    All other components within normal limits  PROTIME-INR  APTT  DIFFERENTIAL  CBG MONITORING, ED    EKG EKG Interpretation  Date/Time:  Tuesday February 26 2022 12:21:37 EST Ventricular Rate:  70 PR Interval:  173 QRS Duration: 158 QT Interval:  460 QTC Calculation: 497 R Axis:   -6 Text Interpretation: Sinus rhythm Right bundle branch block since last tracing no significant change Confirmed by Daleen Bo [phone]) on 02/26/2022 2:54:30 PM  Radiology MR BRAIN WO CONTRAST  Result Date: 02/26/2022 CLINICAL DATA:  Neuro deficit, acute, stroke suspected. Slurred speech. EXAM: MRI HEAD WITHOUT CONTRAST TECHNIQUE: Multiplanar, multiecho pulse sequences of the brain and surrounding structures were obtained without intravenous contrast. COMPARISON:  CT studies earlier today.  MRI 01/20/2020. FINDINGS: Brain: Diffusion imaging does not show any acute or subacute infarction. No focal abnormality affects the brainstem or cerebellum. Cerebral hemispheres show age related volume loss with minimal small vessel change of the white matter. Old lacunar infarction in the right caudate head. No mass, hemorrhage, hydrocephalus or extra-axial collection. Vascular: Major vessels at the base of the brain show flow. Skull and upper cervical spine: Negative Sinuses/Orbits: Rhinosinusitis of the right maxillary sinus. Other sinuses clear. Orbits negative. Other: None IMPRESSION: No acute intracranial finding. Mild chronic small-vessel change of the hemispheric white matter. Old lacunar infarction of the right caudate head. Right maxillary  rhinosinusitis. Electronically Signed   By: Nelson Chimes M.D.   On: 02/26/2022 18:27   CT  HEAD CODE STROKE WO CONTRAST  Result Date: 02/26/2022 CLINICAL DATA:  Code stroke.  Neuro deficit, acute, stroke suspected EXAM: CT HEAD WITHOUT CONTRAST TECHNIQUE: Contiguous axial images were obtained from the base of the skull through the vertex without intravenous contrast. RADIATION DOSE REDUCTION: This exam was performed according to the departmental dose-optimization program which includes automated exposure control, adjustment of the mA and/or kV according to patient size and/or use of iterative reconstruction technique. COMPARISON:  February 15, 2022. FINDINGS: Brain: No evidence of acute large vascular territory infarction, hemorrhage, hydrocephalus, extra-axial collection or mass lesion/mass effect. Partially empty sella. Inferior aspect of the posterior fossa is incompletely imaged. Vascular: No hyperdense vessel identified. Skull: No acute fracture. Sinuses/Orbits: Clear sinuses.  No acute orbital findings. Other: No mastoid effusions. ASPECTS [facility] Stroke Program Early CT Score) total score (0-10 with 10 being normal): 10. IMPRESSION: 1. No evidence of acute in intracranial abnormality. 2. ASPECTS is 10 Code stroke imaging results were communicated on 02/26/2022 at 12:04 pm to provider Dr. Rory Percy via secure text paging. Electronically Signed   By: Margaretha Sheffield M.D.   On: 02/26/2022 12:06   CT ANGIO HEAD NECK W WO CM W PERF (CODE STROKE)  Result Date: 02/26/2022 CLINICAL DATA:  Neuro deficit, acute, stroke suspected. EXAM: CT ANGIOGRAPHY HEAD AND NECK CT PERFUSION BRAIN TECHNIQUE: Multidetector CT imaging of the head and neck was performed using the standard protocol during bolus administration of intravenous contrast. Multiplanar CT image reconstructions and MIPs were obtained to evaluate the vascular anatomy. Carotid stenosis measurements (when applicable) are obtained utilizing NASCET criteria, using  the distal internal carotid diameter as the denominator. Multiphase CT imaging of the brain was performed following IV bolus contrast injection. Subsequent parametric perfusion maps were calculated using RAPID software. RADIATION DOSE REDUCTION: This exam was performed according to the departmental dose-optimization program which includes automated exposure control, adjustment of the mA and/or kV according to patient size and/or use of iterative reconstruction technique. CONTRAST:  139m OMNIPAQUE IOHEXOL 350 MG/ML SOLN COMPARISON:  CT head without contrast 01/29/2022. FINDINGS: CTA NECK FINDINGS Aortic arch: Atherosclerotic calcifications are present about the origin of the left subclavian artery without significant stenosis or aneurysm. Origins of the common carotid artery and innominate artery are not imaged. Right carotid system: The right common carotid artery is within normal limits. Atherosclerotic calcifications are present at the bifurcation. Minimal luminal diameter is 2 mm. Mild tortuosity is present in the more distal right ICA without other significant stenosis. Distal ICA measures 3.8 mm. Left carotid system: The left common carotid artery is tortuous without significant stenosis. Atherosclerotic calcifications are present at the lateral aspect of the left carotid bifurcation without a significant stenosis relative to the more distal vessel. Cervical left ICA is otherwise within normal limits. Vertebral arteries: The left vertebral artery is the dominant vessel. Both vertebral arteries originate from the subclavian arteries without significant stenosis. No significant stenosis is present in either vertebral artery in the neck. Moderate stenoses are present in both vertebral arteries at the dural margin. Skeleton: Fusion noted across the disc space C5-6. Advanced degenerative changes present at C6-7 right at C4-5. No focal osseous lesions are present. Acute or healing fractures are present. Other neck:  An 11 mm nodule is present in the inferior right lobe of the thyroid. The submandibular and parotid glands and ducts are within normal limits. No mucosal lesions are present. No significant adenopathy is present. Upper chest: The lung apices are clear. Thoracic inlet is within normal  limits. Review of the MIP images confirms the above findings CTA HEAD FINDINGS Anterior circulation: Atherosclerotic calcifications are present within the cavernous internal carotid arteries bilaterally. No significant stenosis is present through the ICA termini. The A1 and M1 segments are normal. Anterior communicating artery is patent. MCA bifurcations are intact. ACA and MCA branch vessels are within normal limits. Posterior circulation: Atherosclerotic calcifications present at dural margin of both vertebral arteries. Moderate stenosis is present in the hypoplastic right V3 segment. Distal V4 segment does fill. Left PICA origin is visualized and normal. Dominant right AICA vessel is present. Basilar artery is within normal limits. Both posterior cerebral arteries originate from the basilar tip. Moderate stenosis is present in the right P2 segment without a distal occlusion. Left PCA branch vessels are within normal limits. Venous sinuses: The dural sinuses are patent. The straight sinus deep cerebral veins are intact. Cortical veins are within normal limits. No significant vascular malformation is evident. Anatomic variants: None Review of the MIP images confirms the above findings CT Brain Perfusion Findings: ASPECTS: 10/10 CBF (<30%) Volume: 70m Perfusion (Tmax>6.0s) volume: 030mIMPRESSION: 1. No emergent large vessel occlusion. 2. CT perfusion demonstrates no significant core infarct or significant ischemia. 3. Atherosclerotic changes at the carotid bifurcations bilaterally, within the cavernous internal carotid arteries, and at the dural margin of both vertebral arteries. 4. Moderate stenosis in the hypoplastic right V3 segment  and distal right V4 segment. The distal right V4 segment does fill. 5. Moderate stenosis of the right P2 segment. 6. Degenerative changes in the cervical spine as described. 7. 11 mm nodule in the inferior right lobe of the thyroid. Not clinically significant; no follow-up imaging recommended (ref: J Am Coll Radiol. 2015 Feb;12(2): 143-50). 8. Aortic Atherosclerosis (ICD10-I70.0). Electronically Signed   By: ChSan Morelle.D.   On: 02/26/2022 12:27    Procedures Procedures    Medications Ordered in ED Medications  sodium chloride flush (NS) 0.9 % injection 3 mL (3 mLs Intravenous Given 02/26/22 1221)  iohexol (OMNIPAQUE) 350 MG/ML injection 100 mL (100 mLs Intravenous Contrast Given 02/26/22 1212)  oxyCODONE-acetaminophen (PERCOCET/ROXICET) 5-325 MG per tablet 1 tablet (1 tablet Oral Given 02/26/22 1832)    ED Course/ Medical Decision Making/ A&P Clinical Course as of 02/26/22 1912  Tue Feb 26, 2022  1457 Essentially no change in clinical exam at this time.  She remains alert and conversant.  She is confused. [EW]    Clinical Course User Index [EW] WeDaleen BoMD                           Medical Decision Making Patient presents as code stroke to the ED with EMS.  They were concerned and called code stroke because patient was having trouble articulating things.  She was seen at triage by neuro hospitalist and code stroke was canceled.  Routine testing was ordered.  Amount and/or Complexity of Data Reviewed Independent Historian: caregiver    Details: Patient is communicative and lucid.  She is somewhat confused and cannot recall events such as recent hospitalization when she was injured in a fall.  I was able to reach her son, ToGershon Musselwho states that he talked to her about an hour ago on the phone and felt like she was "slow, like she was sedated from medicine."  He feels like she takes too many narcotics for chronic back pain. External Data Reviewed: notes.    Details: PDMP indicates  that she uses  oxycodone with an MME of 63.  Recent hospitalization with facial fractures after a fall.  She was discharged on 02/17/2022 after 1 day observation in the hospital. Labs: ordered.    Details: CBC, metabolic panel, coagulation studies, urinalysis-normal findings except hemoglobin low, calcium low, urine specific gravity high Radiology: ordered.    Details: CTA head and neck, CT head-no acute abnormalities.  MRI brain ordered to complete stroke evaluation. ECG/medicine tests: ordered and independent interpretation performed.    Details: Cardiac monitor-normal sinus rhythm Discussion of management or test interpretation with external provider(s): Case discussed with neuro hospitalist, at the time the patient was initially seen.  He ordered CTA angiogram head and neck.  He suggested MRI imaging prior to disposition.  Risk Prescription drug management. Decision regarding hospitalization. Risk Details: Patient presenting with suspected speech disorder, possibly stroke, presenting by EMS.  She has ongoing chronic back pain with use of chronic narcotic therapy.  Screening evaluation today is reassuring.  She appears mildly dehydrated clinically and urine specific gravity is high.  Initial CT head, and CTA head and neck did not reveal acute problems.  MRI ordered to evaluate for stroke.  MRI reading and discussion with neurology regarding ongoing management, pending at the time of transition of care.  Dr. Dina Rich will evaluate MRI imaging and contact neurology for help with disposition.  Critical Care Total time providing critical care: 30-74 minutes          Final Clinical Impression(s) / ED Diagnoses Final diagnoses:  Speech disturbance, unspecified type    Rx / DC Orders ED Discharge Orders     None         Daleen Bo, MD 02/26/22 1914

## 2022-02-26 NOTE — Assessment & Plan Note (Addendum)
Refuses CPAP on 3L of O2 at night ?

## 2022-02-26 NOTE — ED Triage Notes (Signed)
Pt to ED as code stroke-LSN yesterday at 1430 and was found to be altered with slurred speech at her PT appointment this morning. Pt A/O x 4 with some mild dysarthria on arrival to ED.  ?

## 2022-02-26 NOTE — Code Documentation (Signed)
Stroke Response Nurse Documentation ?Code Documentation ? ?Linda [name] is a 76 y.o. female arriving to [facility]  via [facility] on 02/26/22 with past medical hx of CAD, COPD, DDD, HTN, GERD, migraines, OSA, recurrent UTIs. On aspirin 81 mg daily. Code stroke was activated by EMS.  ? ?Patient from [facility] where she was [facility] at 1430 on 02/25/22 and now complaining of aphasia, slurred speech, confusion. Therapy was working with patient this am and noted the confusion and word finding issues. She was last known well yesterday afternoon. She had a fall a few weeks ago that was she was seen for and has bruising to her right cheek and eye.  ? ?Stroke team at the bedside on patient arrival. Labs drawn and patient cleared for CT by Dr. Eulis Foster. Patient to CT with team. NIHSS 1, see documentation for details and code stroke times. Patient with dysarthria  on exam. The following imaging was completed:  CT Head, CTA, and CTP. Patient is not a candidate for IV Thrombolytic due to outside the window. Patient is not not a candidate for IR due to no LVO.  ? ?Care Plan: q2x12 then q4 neuro checks/vitals, MRI, permissive HTN BP<220/120.  ? ?Bedside handoff with ED RN Judson Roch.   ? ?Sophia Sperry, Rande Brunt  ?Stroke Response RN ?  ?

## 2022-02-26 NOTE — Telephone Encounter (Signed)
Rx sent. Thanks

## 2022-02-26 NOTE — ED Notes (Signed)
Patient transported to MRI 

## 2022-02-26 NOTE — Assessment & Plan Note (Addendum)
Resume as able to tolerate ?Starting with toprolol ?

## 2022-02-26 NOTE — Consult Note (Signed)
Neurology Consultation  Reason for Consult: Code Stroke Referring Physician: Dr. Eulis Foster   CC: Confusion  History is obtained from: EMS, Chart review  HPI: Linda [name] is a 76 y.o. female with a medical history significant for chronic diastolic CHF, nonobstructive CAD, COPD, essential hypertension, OSA on CPAP, recurrent UTIs, small PE in 2014 briefly on Coumadin for 8-9 months with an acute GIB in 2015 while on Coumadin, and a recent fall on 2/25 with subsequent right maxillary sinus and right orbital floor fracture with mild depression. She lives in an independent living facility and was last seen in her usual state of health during a PT evaluation 3/6 at 14:30. Today, she was found to have slurred speech, to be "talking out of her head", and fidgeting by facility staff which is abnormal for her so EMS was activated. Due to ongoing speech disturbance, EMS activated a Code Stroke for further evaluation on hospital arrival.   LKW: 3/6 14:30 TNK given?: no, patient presented outside of the thrombolytic therapy time window IR Thrombectomy? No, vessel imaging reviewed by neurologist and neuroradiologist without evidence of LVO Modified Rankin Scale: 0-Completely asymptomatic and back to baseline post- stroke  ROS: A complete ROS was performed and is negative except as noted in the HPI.   Past Medical History:  Diagnosis Date   Acute upper GI bleed 06/2014   while pt taking coumadin, plavix, and meloxicam---despite being told not to take coumadin.   Anginal pain ([name])    Nonobstructive CAD 2014; however, her cardiologist put her on a statin for this and NOT for hyperlipidemia per pt report.  Atyp CP 08/2017 at card f/u, plan for myoc perf imaging.   Anxiety    panic attacks   Asthma    w/ asbestososis    BPPV (benign paroxysmal positional vertigo) 12/16/2012   Chronic diastolic CHF (congestive heart failure) (HCC)    dry wt as of 11/06/16 is 168 lbs.   Chronic lower back pain    COPD  (chronic obstructive pulmonary disease) (HCC)    DDD (degenerative disc disease)    lumbar and cervical.    Diverticular disease    Fibromyalgia    Patient states dx was around her late 11s but she had sx's for years prior to this.   H/O hiatal hernia    History of pneumonia    hospitalized 12/2011, 02/2013, and 07/2013 [facility]) for this   HTN (hypertension)    Renal artery dopplers 04/2013 neg for stenosis.   Hypervitaminosis D 09/27/2019   over-supplemented.  Stopped vit D and plan recheck 2 mo.   Idiopathic angio-edema-urticaria 72014; 2021   Angioedema component was very minimal.  2021->Dr. Bobbitt (allergist) eval.   Insomnia    Iron deficiency anemia    Hematologist in [place], [place] did extensive w/u; no cause found; failed oral supplement;; gets fairly regular ([id number] so) IV iron infusions (Venofer -iron sucrose- '200mg'$  with procrit.  "for 14 yr I've been getting blood work q month & getting infusions prn" (07/12/2013).  Dr. EMarin Olplocally, iron infusions done, EPO deficiency dx'd   Kidney stone    Migraine syndrome    "not as often anymore; used to be ~ q wk" (07/12/2013)   Mixed incontinence urge and stress    Nephrolithiasis    "passed all on my own or they are still in there" (07/12/2013)   Neuroleptic induced parkinsonism (HCC) 2018   Dr. TCarles Collet neuro, saw her 11/24/17 and recommended d/c of abilify as first step.  D/c'd abilify and pt got complete recovery.   Oropharyngeal dysphagia    swallowing study speech path 05/2020. Gastric bx's showed gastritis, h pylori NEG   OSA on CPAP    prior to move to [place]--had another sleep study 10/2015 w/pulm Dr. Camillo Flaming.   Osteoarthritis    "severe; progressing fast" (07/12/2013); multiple joints-not surgical candidate for TKR (03/2015).  Triamcinolon knee injections by Dr. Tessa Lerner 12/2017.   Pernicious anemia 08/24/2014   Pleural plaque with presence of asbestos 07/22/2013   Pulmonary embolism ([place]) 07/2013   Dx at [facility] with very small peripheral upper  lobe pe 07/2013: pt took coumadin for about 8-9 mo   Pyelonephritis    "several times over the last 30 yr" (07/12/2013)   RBBB (right bundle branch block)    Recurrent major depression ([place])    Recurrent UTI    hx of hospitalization for pyelonephritis; started abx prophylaxis 06/2015   Syncope    Hypotensive; ED visit--Dr. Terrence Dupont did Cath--nonobstructive CAD, EF 55-60%.  In retrospect, suspect pt rx med misuse/polypharmacy   Past Surgical History:  Procedure Laterality Date   APPENDECTOMY  1960   AXILLARY SURGERY Left 1978   Multiple "lump" in armpit per pt   BIOPSY  06/17/2020   Gastric bx->gastritis, h pylori neg.  Procedure: BIOPSY;  Surgeon: Carol Ada, MD;  Location: [facility];  Service: Endoscopy;;   CARDIAC CATHETERIZATION  01/2013   nonobstructive CAD, EF 55-60%   CARDIOVASCULAR STRESS TEST  02/22/15   Low risk myocard perf imaging; wall motion normal, normal EF   carotid duplex doppler  10/21/2017   R vertebral flow suggestive of possible distal obstruction.  Pt declined further w/u as of 10/29/17 but need to revisit this problem periodically.   COCCYX REMOVAL  1972   DEXA  06/05/2017   T-score -3.1   DILATION AND CURETTAGE OF UTERUS  ? 1970's   ESOPHAGOGASTRODUODENOSCOPY N/A 07/19/2014   Gastritis found + in the setting of supratherapeutic INR, +plavix, + meloxicam.   ESOPHAGOGASTRODUODENOSCOPY (EGD) WITH PROPOFOL N/A 06/17/2020   NO stricture or other prob to explain pt's dysphagia, dilation was done anyway.  Gastric bx's-->gastritis, h pylori neg. Procedure: ESOPHAGOGASTRODUODENOSCOPY (EGD) WITH PROPOFOL;  Surgeon: Carol Ada, MD;  Location: [facility];  Service: Endoscopy;  Laterality: N/A;   EYE SURGERY Left 2012-2013   "injections for ~ 1 yr; don't really know what for" (07/12/2013)   HEEL SPUR SURGERY Left 2008   KNEE SURGERY  2005   LEFT HEART CATHETERIZATION WITH CORONARY ANGIOGRAM N/A 01/30/2013   Procedure: LEFT HEART CATHETERIZATION WITH CORONARY ANGIOGRAM;   Surgeon: Clent Demark, MD;  Location: [facility];  Service: Cardiovascular;  Laterality: N/A;   MALONEY DILATION  06/17/2020   Procedure: Venia Minks DILATION;  Surgeon: Carol Ada, MD;  Location: [facility];  Service: Endoscopy;;   PLANTAR FASCIA RELEASE Left 2008   SPIROMETRY  04/25/14   In hosp for acute asthma/COPD flare: mixed obstructive and restrictive lung disease. The FEV1 is severely reduced at 45% predicted.  FEV1 signif decreased compared to prior spirometry 07/23/13.   TENDON RELEASE  1996   Right forearm and hand   TOTAL ABDOMINAL HYSTERECTOMY  1974   TRANSTHORACIC ECHOCARDIOGRAM  01/2013; 04/2014;08/2015; 09/2017   2014--NORMAL.  2015--focal basal septal hypertrophy, EF 55-60%, grade I diast dysfxn, mild LAE.  08/2015 EF 55-60%, nl LV syst fxn, grade I DD, valves wnl. 10/21/17: EF 65-70%, grd I DD, o/w normal.   Family History  Problem Relation Age of Onset  Arthritis Mother    Kidney disease Mother    Heart disease Father    Stroke Father    Hypertension Father    Diabetes Father    Heart attack Father    Heart attack Paternal Grandmother    Diabetes Sister        one sister   Hypertension Sister    Asthma Sister    Hypertension Brother    Asthma Brother    Asthma Daughter    Multiple sclerosis Son    Social History:   reports that she has never smoked. She has never used smokeless tobacco. She reports that she does not drink alcohol and does not use drugs.  Medications No current facility-administered medications for this encounter.  Current Outpatient Medications:    albuterol (VENTOLIN HFA) 108 (90 Base) MCG/ACT inhaler, Inhale 1-2 puffs into the lungs every 6 (six) hours as needed for wheezing or shortness of breath. (Patient not taking: Reported on 02/16/2022), Disp: 18 g, Rfl: 1   ALPRAZolam (XANAX) 1 MG tablet, TAKE 1 TABLET THREE TIMES DAILY AS NEEDED FOR ANXIETY. (Patient taking differently: Take 1 mg by mouth 3 (three) times daily.), Disp: 90 tablet, Rfl:  5   aspirin 81 MG tablet, Take 81 mg by mouth at bedtime., Disp: , Rfl:    budesonide-formoterol (SYMBICORT) 80-4.5 MCG/ACT inhaler, 2 puffs (Patient not taking: Reported on 02/16/2022), Disp: , Rfl:    diclofenac Sodium (VOLTAREN) 1 % GEL, Apply 2 g topically 4 (four) times daily. 4 times a day to both knees (Patient taking differently: Apply 2 g topically 4 (four) times daily as needed (knee pain).), Disp: 300 g, Rfl: 4   donepezil (ARICEPT) 10 MG tablet, Take 1 tablet (10 mg total) by mouth at bedtime. must have office visit (Patient not taking: Reported on 02/16/2022), Disp: 90 tablet, Rfl: 3   DULoxetine (CYMBALTA) 30 MG capsule, TAKE 1 CAPSULE ONCE DAILY ALONG WITH '60MG'$  CAPSULE FOR TOTAL '90MG'$  DAILY. (Patient taking differently: Take 30 mg by mouth daily. Take '30mg'$  with '60mg'$  to equal '90mg'$  oral daily in the evening.), Disp: 90 capsule, Rfl: 0   DULoxetine (CYMBALTA) 60 MG capsule, TAKE 1 CAPSULE A DAY TO BE COMBINED WITH '30MG'$  CAPSULE (Patient taking differently: Take 60 mg by mouth daily. Take '60mg'$  with '30mg'$  to equal '90mg'$  oral daily in the evening.), Disp: 90 capsule, Rfl: 0   EPINEPHrine 0.3 mg/0.3 mL IJ SOAJ injection, Inject 0.3 mg into the muscle as needed for anaphylaxis. (Patient not taking: Reported on 02/16/2022), Disp: 1 each, Rfl: 1   fluconazole (DIFLUCAN) 100 MG tablet, TAKE (1) TABLET DAILY AS NEEDED FOR THRUSH (Patient taking differently: Take 100 mg by mouth daily as needed (thrush symptoms).), Disp: 15 tablet, Rfl: 1   furosemide (LASIX) 40 MG tablet, Take 1 tablet (40 mg total) by mouth daily as needed for fluid. (Patient not taking: Reported on 02/16/2022), Disp: 30 tablet, Rfl: 6   ipratropium (ATROVENT) 0.03 % nasal spray, Place 2 sprays into both nostrils every 12 (twelve) hours as needed (allergies)., Disp: , Rfl:    isosorbide mononitrate (IMDUR) 30 MG 24 hr tablet, Take 2 tablets (60 mg total) by mouth daily. TAKE 2 TABLETS IN THE AM AND 1 TABLET IN THE PM., Disp: , Rfl:     lamoTRIgine (LAMICTAL) 150 MG tablet, TAKE 1 TABLET EACH DAY. (Patient taking differently: Take 150 mg by mouth daily.), Disp: 90 tablet, Rfl: 0   methocarbamol (ROBAXIN) 500 MG tablet, Take 1 tablet (500 mg  total) by mouth every 6 (six) hours as needed for muscle spasms., Disp: 30 tablet, Rfl: 0   metoprolol succinate (TOPROL-XL) 100 MG 24 hr tablet, take ONE tab daily, take additional 1/2 tab as needed FOR FOR BLOOD PRESSURE > 160/90 (Patient taking differently: Take 50-100 mg by mouth See admin instructions. take '100mg'$  tab daily, take additional '50mg'$  as needed FOR FOR BLOOD PRESSURE > 160/90), Disp: 135 tablet, Rfl: 0   Multiple Vitamin (MULTIVITAMIN WITH MINERALS) TABS tablet, Take 1 tablet by mouth daily., Disp: , Rfl:    nitroGLYCERIN (NITROSTAT) 0.4 MG SL tablet, Place 1 tablet (0.4 mg total) under the tongue every 5 (five) minutes as needed for chest pain (x 3 doses)., Disp: 25 tablet, Rfl: 11   oxybutynin (DITROPAN) 5 MG tablet, Take 1 tablet (5 mg total) by mouth 3 (three) times daily., Disp: 90 tablet, Rfl: 1   oxyCODONE-acetaminophen (PERCOCET) 10-325 MG tablet, Take 1 tablet by mouth See admin instructions. 1 tablet oral twice daily, may take an additional 2 tablets through out the day as needed for pain, Disp: 120 tablet, Rfl: 0   pantoprazole (PROTONIX) 40 MG tablet, TAKE 1 TABLET BY MOUTH TWICE DAILY. (Patient taking differently: Take 40 mg by mouth daily.), Disp: 180 tablet, Rfl: 1   polyvinyl alcohol (LIQUIFILM TEARS) 1.4 % ophthalmic solution, Place 1 drop into both eyes as needed for dry eyes., Disp: , Rfl:    rosuvastatin (CRESTOR) 20 MG tablet, Take 1 tablet (20 mg total) by mouth every evening., Disp: , Rfl:    traZODone (DESYREL) 50 MG tablet, TAKE 2-4 TABLETS AT BEDTIME (Patient taking differently: Take 150-200 mg by mouth at bedtime.), Disp: 120 tablet, Rfl: 11  Exam: Current vital signs: BP (!) 172/79 (BP Location: Right Arm)    Pulse 71    Temp 98 F (36.7 C) (Oral)    Resp  17    Wt 62.4 kg    SpO2 94%    BMI 23.61 kg/m  Vital signs in last 24 hours: Temp:  [98 F (36.7 C)] 98 F (36.7 C) (03/07 1220) Pulse Rate:  [71] 71 (03/07 1220) Resp:  [17] 17 (03/07 1220) BP: (172)/(79) 172/79 (03/07 1220) SpO2:  [94 %] 94 % (03/07 1220) Weight:  [62.4 kg] 62.4 kg (03/07 1100)  GENERAL: Awake, alert, in no acute distress Psych: Affect appropriate for situation, patient is calm and cooperative with examination Head: Normocephalic and atraumatic, without obvious abnormality EENT: Normal conjunctivae, dry mucous membranes, no OP obstruction LUNGS: Normal respiratory effort. Non-labored breathing on room air CV: Regular rate and rhythm on telemetry ABDOMEN: Soft, non-tender, non-distended Extremities: Warm, well perfused, without obvious deformity  NEURO:  Mental Status: Awake, alert, and oriented to person, place, time, and situation. Speech/Language: speech is mildly dysarthric. Patient does have poor attention and concentration.    Naming, repetition, fluency, and comprehension intact without aphasia. No neglect is noted Cranial Nerves:  II: PERRL. Visual fields full.  III, IV, VI: EOMI without ptosis, gaze preference, or nystagmus V: Sensation is intact to light touch and symmetrical to face.  VII: Face is symmetric resting and smiling. VIII: Hearing is intact to voice IX, X: Palate elevation is symmetric. Phonation normal.  XI: Normal sternocleidomastoid and trapezius muscle strength XII: Tongue protrudes midline without fasciculations.   Motor: Antigravity movement present to command in all extremities without vertical drift. There is no obvious asymmetry.  Tone is normal. Bulk is normal.  Sensation: Intact to light touch bilaterally in all four  extremities. No extinction to DSS present.  Coordination: FTN intact bilaterally. HKS intact bilaterally. No pronator drift. Gait: Deferred  NIHSS: 1 for dysarthria  Labs I have reviewed labs in epic and the  results pertinent to this consultation are: CBC    Component Value Date/Time   WBC 8.2 02/26/2022 1151   RBC 4.07 02/26/2022 1151   HGB 11.9 (L) 02/26/2022 1152   HGB 13.6 06/05/2015 1315   HCT 35.0 (L) 02/26/2022 1152   HCT 34.4 01/02/2016 0610   HCT 40.8 06/05/2015 1315   PLT 222 02/26/2022 1151   PLT 242 06/05/2015 1315   MCV 88.7 02/26/2022 1151   MCV 92 06/05/2015 1315   MCH 29.0 02/26/2022 1151   MCHC 32.7 02/26/2022 1151   RDW 12.1 02/26/2022 1151   RDW 13.5 06/05/2015 1315   LYMPHSABS 1.9 02/26/2022 1151   LYMPHSABS 2.4 06/05/2015 1315   MONOABS 0.5 02/26/2022 1151   EOSABS 0.1 02/26/2022 1151   EOSABS 0.0 06/05/2015 1315   BASOSABS 0.0 02/26/2022 1151   BASOSABS 0.0 06/05/2015 1315   CMP     Component Value Date/Time   NA 141 02/26/2022 1152   NA 141 06/05/2015 1315   K 3.9 02/26/2022 1152   K 3.9 06/05/2015 1315   CL 103 02/26/2022 1152   CL 103 06/05/2015 1315   CO2 26 02/26/2022 1151   CO2 28 06/05/2015 1315   GLUCOSE 102 (H) 02/26/2022 1152   GLUCOSE 108 06/05/2015 1315   BUN 8 02/26/2022 1152   BUN 14 06/05/2015 1315   CREATININE 0.70 02/26/2022 1152   CREATININE 0.87 02/03/2017 1523   CALCIUM 8.8 (L) 02/26/2022 1151   CALCIUM 9.0 06/05/2015 1315   PROT 6.6 02/26/2022 1151   PROT 7.5 06/05/2015 1315   ALBUMIN 3.6 02/26/2022 1151   ALBUMIN 3.8 06/05/2015 1315   AST 22 02/26/2022 1151   AST 23 06/05/2015 1315   ALT 12 02/26/2022 1151   ALT 17 06/05/2015 1315   ALKPHOS 86 02/26/2022 1151   ALKPHOS 84 06/05/2015 1315   BILITOT 0.4 02/26/2022 1151   BILITOT 1.00 06/05/2015 1315   GFRNONAA >60 02/26/2022 1151   GFRNONAA 67 08/28/2016 1413   GFRAA >60 06/18/2020 0444   GFRAA 77 08/28/2016 1413   Lipid Panel     Component Value Date/Time   CHOL 129 10/11/2021 1344   TRIG 117.0 10/11/2021 1344   HDL 44.80 10/11/2021 1344   CHOLHDL 3 10/11/2021 1344   VLDL 23.4 10/11/2021 1344   LDLCALC 61 10/11/2021 1344   Lab Results  Component Value  Date   HGBA1C 5.6 01/02/2016   Imaging I have reviewed the images obtained:  CT-scan of the brain 3/7: 1. No evidence of acute in intracranial abnormality. 2. ASPECTS is 10  CT angio head and neck wwo + CT cerebral perfusion 3/7: 1. No emergent large vessel occlusion. 2. CT perfusion demonstrates no significant core infarct or significant ischemia. 3. Atherosclerotic changes at the carotid bifurcations bilaterally, within the cavernous internal carotid arteries, and at the dural margin of both vertebral arteries. 4. Moderate stenosis in the hypoplastic right V3 segment and distal right V4 segment. The distal right V4 segment does fill. 5. Moderate stenosis of the right P2 segment. 6. Degenerative changes in the cervical spine as described. 7. 11 mm nodule in the inferior right lobe of the thyroid. Not clinically significant; no follow-up imaging recommended (ref: J Am Coll Radiol. 2015 Feb;12(2): 143-50). 8. Aortic Atherosclerosis (ICD10-I70.0).  Assessment: 76 y.o. female  with PMHx as above who presented to the ED after being found by her independent living facility staff with new onset aphasia, dysarthria, and confusion. - Examination reveals patient with mild dysarthria and poor attention and concentration. Her initial NIHSS is a 1 due to mild dysarthria. There is no evidence of aphasia on initial neurology examination.  - Imaging is without acute intracranial abnormality, LVO, or core infarction.  - Suspect patient's etiology of presentation is more likely related to acute encephalopathy. Recommend toxic-metabolic work up and evaluation at this time. Cannot rule out small/cortical stroke at this time, will obtain MRI brain and expand full work up if findings of acute ischemia present on imaging.   Recommendations: - MRI brain without contrast - Will expand full stroke work up if MRI positive for acute infarction - Permissive hypertension until stroke definitively ruled out with  MRI - Toxic-metabolic work up per EDP / primary team - UA CXR - Discussed with EDP.   Anibal Henderson, AGAC-NP [facility] Pager: [phone]  Attending Neurohospitalist Addendum Patient seen and examined with APP/Resident. Agree with the history and physical as documented above. Agree with the plan as documented, which I helped formulate. I have independently reviewed the chart, obtained history, review of systems and examined the patient.I have personally reviewed pertinent head/neck/spine imaging (CT/MRI). Please feel free to call with any questions.  -- Amie Portland, MD Neurologist [facility] Pager: [phone]

## 2022-02-26 NOTE — Subjective & Objective (Signed)
Patient coming from independent living facility was seen in her [facility] facility on 6 March during her PT evaluation.  Today she was found to have slurred speech and talking out of her head fidgeting.  EMS was called.  Code stroke was activated patient came to [facility].  Neurology was seen on consult patient was outside of window for therapeutic intervention. ?At the time of her evaluation patient was back to baseline ?

## 2022-02-26 NOTE — Assessment & Plan Note (Signed)
PT OT assessment and treatment ?

## 2022-02-26 NOTE — Addendum Note (Signed)
Addended by: Alger Simons T on: 02/26/2022 12:35 PM ? ? Modules accepted: Orders ? ?

## 2022-02-26 NOTE — Assessment & Plan Note (Signed)
Pain management and outpatient follow-up with neurosurgery versus may need IR consult for kyphoplasty ?

## 2022-02-26 NOTE — Assessment & Plan Note (Signed)
-   most likely multifactorial secondary to combination of  mild dehydration secondary to decreased by mouth intake,  polypharmacy  ? - Will rehydrate  ?  ? - Hold contributing medications  ? - if no improvement may need further imaging to evaluate for CNS pathology pathology such as MRI of the brain  ? - neurological exam appears to be nonfocal but patient unable to cooperate fully  ? - VBG ordered ?  - no history of liver disease ammonia ordered ? ?

## 2022-02-26 NOTE — Telephone Encounter (Signed)
[employer] sent a fax for Oxycodone-APAP 10-325 mg. Per PMP, last fill was 02/08/22 ?

## 2022-02-26 NOTE — Assessment & Plan Note (Signed)
-   currently appears to be slightly on the dry side, hold home diuretics for tonight and restart when appears euvolemic, carefuly follow fluid status and Cr  

## 2022-02-26 NOTE — H&P (Signed)
Linda [name] TXM:[id number] DOB: 10/08/1946 DOA: 02/26/2022     PCP: Tammi Sou, MD   Outpatient Specialists:   CARDS:  Dr. Stanford Breed   Pulmonary  Dr.Ejaz   Urology Dr. Lise Auer, NP  Patient arrived to ER on 02/26/22 at 1147 Referred by Attending Horton, Alvin Critchley, DO   Patient coming from:     From facility  independent living [facility]   Chief Complaint:   Chief Complaint  Patient presents with   Code Stroke    HPI: Linda [name] is a 76 y.o. female with medical history significant of    diastolic CHF, nonobstructive CAD, COPD, essential hypertension, OSA on CPAP, recurrent UTIs, small PE in 2014 briefly on Coumadin for 8-9 months with an acute GIB in 2015 while on Coumadin, and a recent fall on 2/25 with subsequent right maxillary sinus and right orbital floor fracture with mild depression  Presented with   confusion Patient coming from independent living facility was seen in her normal at 2 facility on 6 March during her PT evaluation.  Today she was found to have slurred speech and talking out of her head fidgeting.  EMS was called.  Code stroke was activated patient came to [facility].  Neurology was seen on consult patient was outside of window for therapeutic intervention. At the time of her evaluation patient was back to baseline   Had a fall 3 days ago  She is on chronic opioids for pain control Reported severe back pain and does not feel comfortable going to independent living  No fever or chills, denies CP but reports significant back pain since the fall Reports the home pain meds did not help, reports he always has some degree of back pain but worse after this falls   Initial COVID TEST  in house  PCR testing  Pending  Lab Results  Component Value Date   [place] 02/16/2022   [place] NEGATIVE 11/24/2020   [place] NEGATIVE 06/14/2020   [place] NEGATIVE 01/20/2020     Regarding  pertinent Chronic problems:     Hyperlipidemia -  on statins Crestor Lipid Panel     Component Value Date/Time   CHOL 129 10/11/2021 1344   TRIG 117.0 10/11/2021 1344   HDL 44.80 10/11/2021 1344   CHOLHDL 3 10/11/2021 1344   VLDL 23.4 10/11/2021 1344   LDLCALC 61 10/11/2021 1344     HTN on Toprol, imdure   chronic CHF diastolic - last echo 4/82/5003 Grade I diastolic  dysfunction  EF is 70 to 75%. On lasix       COPD -  followed by pulmonology   on baseline oxygen  3L at night    OSA -  noncompliant with CPAP    Hx of CVA -  with/out residual deficits on Aspirin 81 mg,    Hx of samll PE did not tolerate coumadin    Dementia - on Aricept    Chronic anemia - baseline hg Hemoglobin & Hematocrit  Recent Labs    02/16/22 1456 02/26/22 1151 02/26/22 1152  HGB 13.6 11.8* 11.9*     While in ER: Clinical Course as of 02/26/22 2312  Tue Feb 26, 2022  1457 Essentially no change in clinical exam at this time.  She remains alert and conversant.  She is confused. [EW]    Clinical Course User Index [EW] Daleen Bo, MD      Ordered  CT HEAD   NON acute  CTA head  No emergent large vessel occlusion. CXR -  NON acute MRI - non acute CT lumbasr and thoracic spine Acute compression fracture involving the superior endplate of X10 with mild 25% height loss and 3 mm bony retropulsion. No significant stenosis. 2. Additional mild chronic compression deformities involving the T1, T3, T5, T7, and T9 vertebral bodies.   Following Medications were ordered in ER: Medications  sodium chloride flush (NS) 0.9 % injection 3 mL (3 mLs Intravenous Given 02/26/22 1221)  iohexol (OMNIPAQUE) 350 MG/ML injection 100 mL (100 mLs Intravenous Contrast Given 02/26/22 1212)  oxyCODONE-acetaminophen (PERCOCET/ROXICET) 5-325 MG per tablet 1 tablet (1 tablet Oral Given 02/26/22 1832)  morphine (PF) 4 MG/ML injection 4 mg (4 mg Intravenous Given 02/26/22 2118)     _______________________________________________________ ER Provider Called:   Neurology  Dr. Rory Percy They Recommend admit to medicine  No evidence of CVA    ED Triage Vitals  Enc Vitals Group     BP 02/26/22 1200 (!) 162/83     Pulse Rate 02/26/22 1220 71     Resp 02/26/22 1220 17     Temp 02/26/22 1220 98 F (36.7 C)     Temp Source 02/26/22 1220 Oral     SpO2 02/26/22 1220 94 %     Weight 02/26/22 1100 137 lb 9.1 oz (62.4 kg)     Height --      Head Circumference --      Peak Flow --      Pain Score 02/26/22 1217 7     Pain Loc --      Pain Edu? --      Excl. in [place]? --   TMAX(24)@     _________________________________________ Significant initial  Findings: Abnormal Labs Reviewed  CBC - Abnormal; Notable for the following components:      Result Value   Hemoglobin 11.8 (*)    All other components within normal limits  COMPREHENSIVE METABOLIC PANEL - Abnormal; Notable for the following components:   Glucose, Bld 103 (*)    Calcium 8.8 (*)    All other components within normal limits  URINALYSIS, ROUTINE W REFLEX MICROSCOPIC - Abnormal; Notable for the following components:   Color, Urine STRAW (*)    Specific Gravity, Urine 1.034 (*)    Leukocytes,Ua SMALL (*)    Non Squamous Epithelial 0-5 (*)    All other components within normal limits  CBG MONITORING, ED - Abnormal; Notable for the following components:   Glucose-Capillary 102 (*)    All other components within normal limits  I-STAT CHEM 8, ED - Abnormal; Notable for the following components:   Glucose, Bld 102 (*)    Calcium, Ion 1.14 (*)    Hemoglobin 11.9 (*)    HCT 35.0 (*)    All other components within normal limits       ECG: Ordered Personally reviewed by me showing: HR : 70  Rhythm:  NSR,   RBBB,   no evidence of ischemic changes QTC 497    The recent clinical data is shown below. Vitals:   02/26/22 2000 02/26/22 2100 02/26/22 2200 02/26/22 2300  BP: (!) 181/168 (!) 180/87 (!) 167/52 (!)  169/86  Pulse: 69 71 62 67  Resp: '10 20 12 17  '$ Temp:      TempSrc:      SpO2: 96% 97% 96% 100%  Weight:         WBC     Component Value Date/Time   WBC  8.2 02/26/2022 1151   LYMPHSABS 1.9 02/26/2022 1151   LYMPHSABS 2.4 06/05/2015 1315   MONOABS 0.5 02/26/2022 1151   EOSABS 0.1 02/26/2022 1151   EOSABS 0.0 06/05/2015 1315   BASOSABS 0.0 02/26/2022 1151   BASOSABS 0.0 06/05/2015 1315      UA   no evidence of UTI      Urine analysis:    Component Value Date/Time   COLORURINE STRAW (A) 02/26/2022 1453   APPEARANCEUR CLEAR 02/26/2022 1453   LABSPEC 1.034 (H) 02/26/2022 1453   PHURINE 7.0 02/26/2022 1453   GLUCOSEU NEGATIVE 02/26/2022 1453   HGBUR NEGATIVE 02/26/2022 1453   BILIRUBINUR NEGATIVE 02/26/2022 1453   BILIRUBINUR negative 07/16/2021 1147   KETONESUR NEGATIVE 02/26/2022 1453   PROTEINUR NEGATIVE 02/26/2022 1453   UROBILINOGEN 0.2 07/16/2021 1147   UROBILINOGEN 1.0 10/16/2015 2009   NITRITE NEGATIVE 02/26/2022 1453   LEUKOCYTESUR SMALL (A) 02/26/2022 1453    Results for orders placed or performed during the hospital encounter of 02/16/22  Urine Culture     Status: Abnormal   Collection Time: 02/15/22 10:43 PM   Specimen: Urine, Clean Catch  Result Value Ref Range Status   Specimen Description   Final    URINE, CLEAN CATCH Performed at [facility], [address] [place], [place] [address]    Special Requests   Final    NONE Performed at [facility], [address] [place], [place] [address]    Culture MULTIPLE SPECIES PRESENT, SUGGEST RECOLLECTION (A)  Final   Report Status 02/17/2022 FINAL  Final  Resp Panel by RT-PCR (Flu A&B, Covid) Nasopharyngeal Swab     Status: None   Collection Time: 02/16/22  4:17 AM   Specimen: Nasopharyngeal Swab; Nasopharyngeal(NP) swabs in vial transport medium  Result Value Ref Range Status   SARS Coronavirus 2 by RT PCR NEGATIVE NEGATIVE Final         Influenza A by PCR NEGATIVE NEGATIVE  Final   Influenza B by PCR NEGATIVE NEGATIVE Final         *Note: Due to a large number of results and/or encounters for the requested time period, some results have not been displayed. A complete set of results can be found in Results Review.     _______________________________________________ Hospitalist was called for admission for thoracic vertebral fracture and debility  The following Work up has been ordered so far:  Orders Placed This Encounter  Procedures   CT HEAD CODE STROKE WO CONTRAST   CT ANGIO HEAD NECK W WO CM W PERF (CODE STROKE)   MR BRAIN WO CONTRAST   CT Lumbar Spine Wo Contrast   CT Thoracic Spine Wo Contrast   Protime-INR   APTT   CBC   Differential   Comprehensive metabolic panel   Urinalysis, Routine w reflex microscopic Urine, Catheterized   Diet NPO time specified   Cardiac monitoring   Stroke swallow screen   NIH Stroke Scale   Modified Stroke Scale (mNIHSS) Document mNIHSS assessment every 2 hours for a total of 12 hours   Saline Lock IV, Maintain IV access   If O2 sat   Consult to neurology   Consult to hospitalist   Pulse oximetry, continuous   CBG monitoring, ED   I-stat chem 8, ED   CBG monitoring, ED   ED EKG   EKG 12-Lead     OTHER Significant initial  Findings:  labs showing:    Recent Labs  Lab 02/26/22  1151 02/26/22 1152  NA 139 141  K 3.9 3.9  CO2 26  --   GLUCOSE 103* 102*  BUN 9 8  CREATININE 0.83 0.70  CALCIUM 8.8*  --     Cr  stable,     Lab Results  Component Value Date   CREATININE 0.70 02/26/2022   CREATININE 0.83 02/26/2022   CREATININE 0.69 02/16/2022    Recent Labs  Lab 02/26/22 1151  AST 22  ALT 12  ALKPHOS 86  BILITOT 0.4  PROT 6.6  ALBUMIN 3.6   Lab Results  Component Value Date   CALCIUM 8.8 (L) 02/26/2022   PHOS 3.2 02/16/2022    Plt: Lab Results  Component Value Date   PLT 222 02/26/2022   Venous  Blood Gas ordered      Recent Labs  Lab 02/26/22 1151 02/26/22 1152  WBC  8.2  --   NEUTROABS 5.6  --   HGB 11.8* 11.9*  HCT 36.1 35.0*  MCV 88.7  --   PLT 222  --     HG/HCT   stable     Component Value Date/Time   HGB 11.9 (L) 02/26/2022 1152   HGB 13.6 06/05/2015 1315   HCT 35.0 (L) 02/26/2022 1152   HCT 34.4 01/02/2016 0610   HCT 40.8 06/05/2015 1315   MCV 88.7 02/26/2022 1151   MCV 92 06/05/2015 1315     No results for input(s): AMMONIA in the last 168 hours.    Cardiac Panel (last 3 results) No results for input(s): CKTOTAL, CKMB, TROPONINI, RELINDX in the last 72 hours.  .car BNP (last 3 results) No results for input(s): BNP in the last 8760 hours.    DM  labs:  HbA1C: No results for input(s): HGBA1C in the last 8760 hours.     CBG (last 3)  Recent Labs    02/26/22 1147  GLUCAP 102*          Cultures:    Component Value Date/Time   SDES  02/15/2022 2243    URINE, CLEAN CATCH Performed at [facility], [address] [place], [place] [address]    [facility]  02/15/2022 2243    NONE Performed at [facility], [address] [place], [place] [address]    CULT MULTIPLE SPECIES PRESENT, SUGGEST RECOLLECTION (A) 02/15/2022 2243   REPTSTATUS 02/17/2022 FINAL 02/15/2022 2243     Radiological Exams on Admission: CT Thoracic Spine Wo Contrast  Result Date: 02/26/2022 CLINICAL DATA:  Initial evaluation for acute compression fracture, back pain. EXAM: CT THORACIC AND LUMBAR SPINE WITHOUT CONTRAST TECHNIQUE: Multidetector CT imaging of the thoracic and lumbar spine was performed without contrast. Multiplanar CT image reconstructions were also generated. RADIATION DOSE REDUCTION: This exam was performed according to the departmental dose-optimization program which includes automated exposure control, adjustment of the mA and/or kV according to patient size and/or use of iterative reconstruction technique. COMPARISON:  Prior chest CT from 09/15/2014. FINDINGS: CT THORACIC SPINE FINDINGS Alignment: Mild  exaggeration of the normal thoracic kyphosis. No listhesis. Vertebrae: Acute compression fracture involving the superior endplate of W58 with mild 25% height loss and 3 mm bony retropulsion. This is benign/mechanical in appearance. No significant stenosis. No other acute fracture seen within the thoracic spine. Additional mild multilevel chronic compression deformities involving the T1, T3, T5, T7, and T9 vertebral bodies noted. No discrete or worrisome osseous lesions. Visualized ribs intact. Paraspinal and other soft tissues: Mild paraspinous edema adjacent to the acute  T12 fracture. Paraspinous soft tissues demonstrate no other acute finding. Multiple partially calcified pleural plaques noted about the partially visualized lungs, suggesting prior asbestos exposure. Underlying mild pulmonary interstitial congestion noted. Aortic atherosclerosis. Disc levels: No significant disc pathology seen within the thoracic spine for age. No significant spinal stenosis. Foramina appear patent. CT LUMBAR SPINE FINDINGS Segmentation: Standard. Lowest well-formed disc space labeled the L5-S1 level. Alignment: 5 mm facet mediated anterolisthesis of L5 on S1. Alignment otherwise normal with preservation of the normal lumbar lordosis. Vertebrae: Vertebral body height maintained without acute or chronic fracture. Visualized sacrum and pelvis intact. SI joints symmetric and within normal limits. No discrete or worrisome osseous lesions. Paraspinal and other soft tissues: Paraspinous soft tissues demonstrate no acute finding. Secreted IV contrast material present within the renal collecting systems and bladder. Aortic atherosclerosis noted. Disc levels: L1-2:  Unremarkable. L2-3: Mild circumferential disc bulge. No significant spinal stenosis. Foramina remain patent. L3-4: Moderate degenerative intervertebral disc space narrowing with diffuse disc bulge and disc desiccation. Mild facet hypertrophy. No significant spinal stenosis.  Foramina remain patent. L4-5: Mild-to-moderate intervertebral disc space narrowing with diffuse disc bulge and disc desiccation. Mild to moderate facet hypertrophy. No significant spinal stenosis. Foramina remain patent. L5-S1: 5 mm anterolisthesis. Associated mild diffuse disc bulge with moderately advanced facet hypertrophy. No spinal stenosis. Foramina remain patent. IMPRESSION: CT THORACIC SPINE IMPRESSION: 1. Acute compression fracture involving the superior endplate of U38 with mild 25% height loss and 3 mm bony retropulsion. No significant stenosis. 2. Additional mild chronic compression deformities involving the T1, T3, T5, T7, and T9 vertebral bodies. 3. No other acute traumatic injury within the thoracic spine. 4. Multiple partially calcified pleural plaques about the partially visualized lungs, suggesting prior asbestos exposure. Finding could be further assessed with dedicated cross-sectional of the chest as clinically warranted. 5. Underlying mild pulmonary interstitial congestion. CT LUMBAR SPINE IMPRESSION: 1. No acute traumatic injury within the lumbar spine. 2. Mild to moderate multilevel degenerative spondylolysis and facet arthrosis within the lumbar spine, most notable at L5-S1, where there is 5 mm facet mediated anterolisthesis. No significant stenosis or neural impingement. Aortic Atherosclerosis (ICD10-I70.0). Electronically Signed   By: Jeannine Boga M.D.   On: 02/26/2022 21:52   CT Lumbar Spine Wo Contrast  Result Date: 02/26/2022 CLINICAL DATA:  Initial evaluation for acute compression fracture, back pain. EXAM: CT THORACIC AND LUMBAR SPINE WITHOUT CONTRAST TECHNIQUE: Multidetector CT imaging of the thoracic and lumbar spine was performed without contrast. Multiplanar CT image reconstructions were also generated. RADIATION DOSE REDUCTION: This exam was performed according to the departmental dose-optimization program which includes automated exposure control, adjustment of the mA  and/or kV according to patient size and/or use of iterative reconstruction technique. COMPARISON:  Prior chest CT from 09/15/2014. FINDINGS: CT THORACIC SPINE FINDINGS Alignment: Mild exaggeration of the normal thoracic kyphosis. No listhesis. Vertebrae: Acute compression fracture involving the superior endplate of G53 with mild 25% height loss and 3 mm bony retropulsion. This is benign/mechanical in appearance. No significant stenosis. No other acute fracture seen within the thoracic spine. Additional mild multilevel chronic compression deformities involving the T1, T3, T5, T7, and T9 vertebral bodies noted. No discrete or worrisome osseous lesions. Visualized ribs intact. Paraspinal and other soft tissues: Mild paraspinous edema adjacent to the acute T12 fracture. Paraspinous soft tissues demonstrate no other acute finding. Multiple partially calcified pleural plaques noted about the partially visualized lungs, suggesting prior asbestos exposure. Underlying mild pulmonary interstitial congestion noted. Aortic atherosclerosis. Disc levels: No significant  disc pathology seen within the thoracic spine for age. No significant spinal stenosis. Foramina appear patent. CT LUMBAR SPINE FINDINGS Segmentation: Standard. Lowest well-formed disc space labeled the L5-S1 level. Alignment: 5 mm facet mediated anterolisthesis of L5 on S1. Alignment otherwise normal with preservation of the normal lumbar lordosis. Vertebrae: Vertebral body height maintained without acute or chronic fracture. Visualized sacrum and pelvis intact. SI joints symmetric and within normal limits. No discrete or worrisome osseous lesions. Paraspinal and other soft tissues: Paraspinous soft tissues demonstrate no acute finding. Secreted IV contrast material present within the renal collecting systems and bladder. Aortic atherosclerosis noted. Disc levels: L1-2:  Unremarkable. L2-3: Mild circumferential disc bulge. No significant spinal stenosis. Foramina  remain patent. L3-4: Moderate degenerative intervertebral disc space narrowing with diffuse disc bulge and disc desiccation. Mild facet hypertrophy. No significant spinal stenosis. Foramina remain patent. L4-5: Mild-to-moderate intervertebral disc space narrowing with diffuse disc bulge and disc desiccation. Mild to moderate facet hypertrophy. No significant spinal stenosis. Foramina remain patent. L5-S1: 5 mm anterolisthesis. Associated mild diffuse disc bulge with moderately advanced facet hypertrophy. No spinal stenosis. Foramina remain patent. IMPRESSION: CT THORACIC SPINE IMPRESSION: 1. Acute compression fracture involving the superior endplate of E26 with mild 25% height loss and 3 mm bony retropulsion. No significant stenosis. 2. Additional mild chronic compression deformities involving the T1, T3, T5, T7, and T9 vertebral bodies. 3. No other acute traumatic injury within the thoracic spine. 4. Multiple partially calcified pleural plaques about the partially visualized lungs, suggesting prior asbestos exposure. Finding could be further assessed with dedicated cross-sectional of the chest as clinically warranted. 5. Underlying mild pulmonary interstitial congestion. CT LUMBAR SPINE IMPRESSION: 1. No acute traumatic injury within the lumbar spine. 2. Mild to moderate multilevel degenerative spondylolysis and facet arthrosis within the lumbar spine, most notable at L5-S1, where there is 5 mm facet mediated anterolisthesis. No significant stenosis or neural impingement. Aortic Atherosclerosis (ICD10-I70.0). Electronically Signed   By: Jeannine Boga M.D.   On: 02/26/2022 21:52   MR BRAIN WO CONTRAST  Result Date: 02/26/2022 CLINICAL DATA:  Neuro deficit, acute, stroke suspected. Slurred speech. EXAM: MRI HEAD WITHOUT CONTRAST TECHNIQUE: Multiplanar, multiecho pulse sequences of the brain and surrounding structures were obtained without intravenous contrast. COMPARISON:  CT studies earlier today.  MRI  01/20/2020. FINDINGS: Brain: Diffusion imaging does not show any acute or subacute infarction. No focal abnormality affects the brainstem or cerebellum. Cerebral hemispheres show age related volume loss with minimal small vessel change of the white matter. Old lacunar infarction in the right caudate head. No mass, hemorrhage, hydrocephalus or extra-axial collection. Vascular: Major vessels at the base of the brain show flow. Skull and upper cervical spine: Negative Sinuses/Orbits: Rhinosinusitis of the right maxillary sinus. Other sinuses clear. Orbits negative. Other: None IMPRESSION: No acute intracranial finding. Mild chronic small-vessel change of the hemispheric white matter. Old lacunar infarction of the right caudate head. Right maxillary rhinosinusitis. Electronically Signed   By: Nelson Chimes M.D.   On: 02/26/2022 18:27   CT HEAD CODE STROKE WO CONTRAST  Result Date: 02/26/2022 CLINICAL DATA:  Code stroke.  Neuro deficit, acute, stroke suspected EXAM: CT HEAD WITHOUT CONTRAST TECHNIQUE: Contiguous axial images were obtained from the base of the skull through the vertex without intravenous contrast. RADIATION DOSE REDUCTION: This exam was performed according to the departmental dose-optimization program which includes automated exposure control, adjustment of the mA and/or kV according to patient size and/or use of iterative reconstruction technique. COMPARISON:  February 15, 2022.  FINDINGS: Brain: No evidence of acute large vascular territory infarction, hemorrhage, hydrocephalus, extra-axial collection or mass lesion/mass effect. Partially empty sella. Inferior aspect of the posterior fossa is incompletely imaged. Vascular: No hyperdense vessel identified. Skull: No acute fracture. Sinuses/Orbits: Clear sinuses.  No acute orbital findings. Other: No mastoid effusions. ASPECTS [facility] Stroke Program Early CT Score) total score (0-10 with 10 being normal): 10. IMPRESSION: 1. No evidence of acute in  intracranial abnormality. 2. ASPECTS is 10 Code stroke imaging results were communicated on 02/26/2022 at 12:04 pm to provider Dr. Rory Percy via secure text paging. Electronically Signed   By: Margaretha Sheffield M.D.   On: 02/26/2022 12:06   CT ANGIO HEAD NECK W WO CM W PERF (CODE STROKE)  Result Date: 02/26/2022 CLINICAL DATA:  Neuro deficit, acute, stroke suspected. EXAM: CT ANGIOGRAPHY HEAD AND NECK CT PERFUSION BRAIN TECHNIQUE: Multidetector CT imaging of the head and neck was performed using the standard protocol during bolus administration of intravenous contrast. Multiplanar CT image reconstructions and MIPs were obtained to evaluate the vascular anatomy. Carotid stenosis measurements (when applicable) are obtained utilizing NASCET criteria, using the distal internal carotid diameter as the denominator. Multiphase CT imaging of the brain was performed following IV bolus contrast injection. Subsequent parametric perfusion maps were calculated using RAPID software. RADIATION DOSE REDUCTION: This exam was performed according to the departmental dose-optimization program which includes automated exposure control, adjustment of the mA and/or kV according to patient size and/or use of iterative reconstruction technique. CONTRAST:  120m OMNIPAQUE IOHEXOL 350 MG/ML SOLN COMPARISON:  CT head without contrast 01/29/2022. FINDINGS: CTA NECK FINDINGS Aortic arch: Atherosclerotic calcifications are present about the origin of the left subclavian artery without significant stenosis or aneurysm. Origins of the common carotid artery and innominate artery are not imaged. Right carotid system: The right common carotid artery is within normal limits. Atherosclerotic calcifications are present at the bifurcation. Minimal luminal diameter is 2 mm. Mild tortuosity is present in the more distal right ICA without other significant stenosis. Distal ICA measures 3.8 mm. Left carotid system: The left common carotid artery is tortuous  without significant stenosis. Atherosclerotic calcifications are present at the lateral aspect of the left carotid bifurcation without a significant stenosis relative to the more distal vessel. Cervical left ICA is otherwise within normal limits. Vertebral arteries: The left vertebral artery is the dominant vessel. Both vertebral arteries originate from the subclavian arteries without significant stenosis. No significant stenosis is present in either vertebral artery in the neck. Moderate stenoses are present in both vertebral arteries at the dural margin. Skeleton: Fusion noted across the disc space C5-6. Advanced degenerative changes present at C6-7 right at C4-5. No focal osseous lesions are present. Acute or healing fractures are present. Other neck: An 11 mm nodule is present in the inferior right lobe of the thyroid. The submandibular and parotid glands and ducts are within normal limits. No mucosal lesions are present. No significant adenopathy is present. Upper chest: The lung apices are clear. Thoracic inlet is within normal limits. Review of the MIP images confirms the above findings CTA HEAD FINDINGS Anterior circulation: Atherosclerotic calcifications are present within the cavernous internal carotid arteries bilaterally. No significant stenosis is present through the ICA termini. The A1 and M1 segments are normal. Anterior communicating artery is patent. MCA bifurcations are intact. ACA and MCA branch vessels are within normal limits. Posterior circulation: Atherosclerotic calcifications present at dural margin of both vertebral arteries. Moderate stenosis is present in the hypoplastic right V3 segment.  Distal V4 segment does fill. Left PICA origin is visualized and normal. Dominant right AICA vessel is present. Basilar artery is within normal limits. Both posterior cerebral arteries originate from the basilar tip. Moderate stenosis is present in the right P2 segment without a distal occlusion. Left PCA  branch vessels are within normal limits. Venous sinuses: The dural sinuses are patent. The straight sinus deep cerebral veins are intact. Cortical veins are within normal limits. No significant vascular malformation is evident. Anatomic variants: None Review of the MIP images confirms the above findings CT Brain Perfusion Findings: ASPECTS: 10/10 CBF (<30%) Volume: 67m Perfusion (Tmax>6.0s) volume: 067mIMPRESSION: 1. No emergent large vessel occlusion. 2. CT perfusion demonstrates no significant core infarct or significant ischemia. 3. Atherosclerotic changes at the carotid bifurcations bilaterally, within the cavernous internal carotid arteries, and at the dural margin of both vertebral arteries. 4. Moderate stenosis in the hypoplastic right V3 segment and distal right V4 segment. The distal right V4 segment does fill. 5. Moderate stenosis of the right P2 segment. 6. Degenerative changes in the cervical spine as described. 7. 11 mm nodule in the inferior right lobe of the thyroid. Not clinically significant; no follow-up imaging recommended (ref: J Am Coll Radiol. 2015 Feb;12(2): 143-50). 8. Aortic Atherosclerosis (ICD10-I70.0). Electronically Signed   By: ChSan Morelle.D.   On: 02/26/2022 12:27   _______________________________________________________________________________________________________ Latest  Blood pressure (!) 169/86, pulse 67, temperature 98 F (36.7 C), temperature source Oral, resp. rate 17, weight 62.4 kg, SpO2 100 %.   Vitals  labs and radiology finding personally reviewed  Review of Systems:    Pertinent positives include:  falls back pain.   fatigue,  Constitutional:  No weight loss, night sweats, Fevers, chills,weight loss  HEENT:  No headaches, Difficulty swallowing,Tooth/dental problems,Sore throat,  No sneezing, itching, ear ache, nasal congestion, post nasal drip,  Cardio-vascular:  No chest pain, Orthopnea, PND, anasarca, dizziness, palpitations.no Bilateral  lower extremity swelling  GI:  No heartburn, indigestion, abdominal pain, nausea, vomiting, diarrhea, change in bowel habits, loss of appetite, melena, blood in stool, hematemesis Resp:  no shortness of breath at rest. No dyspnea on exertion, No excess mucus, no productive cough, No non-productive cough, No coughing up of blood.No change in color of mucus.No wheezing. Skin:  no rash or lesions. No jaundice GU:  no dysuria, change in color of urine, no urgency or frequency. No straining to urinate.  No flank pain.  Musculoskeletal:  No joint pain or no joint swelling. No decreased range of motion. No  Psych:  No change in mood or affect. No depression or anxiety. No memory loss.  Neuro: no localizing neurological complaints, no tingling, no weakness, no double vision, no gait abnormality, no slurred speech, no confusion  All systems reviewed and apart from HOGlen Ridgell are negative _______________________________________________________________________________________________ Past Medical History:   Past Medical History:  Diagnosis Date   Acute upper GI bleed 06/2014   while pt taking coumadin, plavix, and meloxicam---despite being told not to take coumadin.   Anginal pain ([place]   Nonobstructive CAD 2014; however, her cardiologist put her on a statin for this and NOT for hyperlipidemia per pt report.  Atyp CP 08/2017 at card f/u, plan for myoc perf imaging.   Anxiety    panic attacks   Asthma    w/ asbestososis    BPPV (benign paroxysmal positional vertigo) 12/16/2012   Chronic diastolic CHF (congestive heart failure) (HCC)    dry wt as of 11/06/16 is 168  lbs.   Chronic lower back pain    COPD (chronic obstructive pulmonary disease) (HCC)    DDD (degenerative disc disease)    lumbar and cervical.    Diverticular disease    Fibromyalgia    Patient states dx was around her late 78s but she had sx's for years prior to this.   H/O hiatal hernia    History of pneumonia    hospitalized  12/2011, 02/2013, and 07/2013 [facility]) for this   HTN (hypertension)    Renal artery dopplers 04/2013 neg for stenosis.   Hypervitaminosis D 09/27/2019   over-supplemented.  Stopped vit D and plan recheck 2 mo.   Idiopathic angio-edema-urticaria 72014; 2021   Angioedema component was very minimal.  2021->Dr. Bobbitt (allergist) eval.   Insomnia    Iron deficiency anemia    [place] in [place] w/u; no cause found; failed oral supplement;; gets fairly regular (q40mor so) IV iron infusions (Venofer -iron sucrose- '200mg'$  with procrit.  "for 14 yr I've been getting blood work q month & getting infusions prn" (07/12/2013).  Dr. EMarin Olplocally, iron infusions done, EPO deficiency dx'd   Kidney stone    Migraine syndrome    "not as often anymore; used to be ~ q wk" (07/12/2013)   Mixed incontinence urge and stress    Nephrolithiasis    "passed all on my own or they are still in there" (07/12/2013)   Neuroleptic induced parkinsonism (HCC) 2018   Dr. TCarles Collet neuro, saw her 11/24/17 and recommended d/c of abilify as first step.  D/c'd abilify and pt got complete recovery.   Oropharyngeal dysphagia    swallowing study speech path 05/2020. Gastric bx's showed gastritis, h pylori NEG   OSA on CPAP    prior to move to [place]--had another sleep study 10/2015 w/pulm Dr. ECamillo Flaming   Osteoarthritis    "severe; progressing fast" (07/12/2013); multiple joints-not surgical candidate for TKR (03/2015).  Triamcinolon knee injections by Dr. STessa Lerner1/2019.   Pernicious anemia 08/24/2014   Pleural plaque with presence of asbestos 07/22/2013   [place]   [facility]: pt took coumadin for about 8-9 mo   Pyelonephritis    "several times over the last 30 yr" (07/12/2013)   RBBB (right bundle branch block)    Recurrent major depression ([place]    Recurrent UTI    hx of hospitalization for pyelonephritis; started abx prophylaxis 06/2015   Syncope     Hypotensive; ED visit--Dr. HTerrence Dupontdid Cath--nonobstructive CAD, EF 55-60%.  In retrospect, suspect pt rx med misuse/polypharmacy      Past Surgical History:  Procedure Laterality Date   APPENDECTOMY  1960   AXILLARY SURGERY Left 1978   Multiple "lump" in armpit per pt   BIOPSY  06/17/2020   Gastric bx->gastritis, h pylori neg.  Procedure: BIOPSY;  Surgeon: HCarol Ada MD;  Location: [facility];  Service: Endoscopy;;   CARDIAC CATHETERIZATION  01/2013   nonobstructive CAD, EF 55-60%   CARDIOVASCULAR STRESS TEST  02/22/15   Low risk myocard perf imaging; wall motion normal, normal EF   carotid duplex doppler  10/21/2017   R vertebral flow suggestive of possible distal obstruction.  Pt declined further w/u as of 10/29/17 but need to revisit this problem periodically.   COCCYX REMOVAL  1972   DEXA  06/05/2017   T-score -3.1   DILATION AND CURETTAGE OF UTERUS  ? 1970's   ESOPHAGOGASTRODUODENOSCOPY N/A 07/19/2014  Gastritis found + in the setting of supratherapeutic INR, +plavix, + meloxicam.   ESOPHAGOGASTRODUODENOSCOPY (EGD) WITH PROPOFOL N/A 06/17/2020   NO stricture or other prob to explain pt's dysphagia, dilation was done anyway.  Gastric bx's-->gastritis, h pylori neg. Procedure: ESOPHAGOGASTRODUODENOSCOPY (EGD) WITH PROPOFOL;  Surgeon: Carol Ada, MD;  Location: [facility];  Service: Endoscopy;  Laterality: N/A;   EYE SURGERY Left 2012-2013   "injections for ~ 1 yr; don't really know what for" (07/12/2013)   HEEL SPUR SURGERY Left 2008   KNEE SURGERY  2005   LEFT HEART CATHETERIZATION WITH CORONARY ANGIOGRAM N/A 01/30/2013   Procedure: LEFT HEART CATHETERIZATION WITH CORONARY ANGIOGRAM;  Surgeon: Clent Demark, MD;  Location: [facility];  Service: Cardiovascular;  Laterality: N/A;   MALONEY DILATION  06/17/2020   Procedure: Venia Minks DILATION;  Surgeon: Carol Ada, MD;  Location: [facility];  Service: Endoscopy;;   PLANTAR FASCIA RELEASE Left 2008   SPIROMETRY  04/25/14    In hosp for acute asthma/COPD flare: mixed obstructive and restrictive lung disease. The FEV1 is severely reduced at 45% predicted.  FEV1 signif decreased compared to prior spirometry 07/23/13.   TENDON RELEASE  1996   Right forearm and hand   TOTAL ABDOMINAL HYSTERECTOMY  1974   TRANSTHORACIC ECHOCARDIOGRAM  01/2013; 04/2014;08/2015; 09/2017   2014--NORMAL.  2015--focal basal septal hypertrophy, EF 55-60%, grade I diast dysfxn, mild LAE.  08/2015 EF 55-60%, nl LV syst fxn, grade I DD, valves wnl. 10/21/17: EF 65-70%, grd I DD, o/w normal.    Social History:  Ambulatory  walker        reports that she has never smoked. She has never used smokeless tobacco. She reports that she does not drink alcohol and does not use drugs.     Family History:   Family History  Problem Relation Age of Onset   Arthritis Mother    Kidney disease Mother    Heart disease Father    Stroke Father    Hypertension Father    Diabetes Father    Heart attack Father    Heart attack Paternal Grandmother    Diabetes Sister        one sister   Hypertension Sister    Asthma Sister    Hypertension Brother    Asthma Brother    Asthma Daughter    Multiple sclerosis Son    ______________________________________________________________________________________________ Allergies: Allergies  Allergen Reactions   Abilify [Aripiprazole] Other (See Comments)    parkinsonism   Bactrim [Sulfamethoxazole-Trimethoprim] Rash   Penicillins Itching, Swelling and Rash    Tolerated Cefepime in ED. Has patient had a PCN reaction causing immediate rash, facial/tongue/throat swelling, SOB or lightheadedness with hypotension: Yes Has patient had a PCN reaction causing severe rash involving mucus membranes or skin necrosis: No Has patient had a PCN reaction that required hospitalization: No  Has patient had a PCN reaction occurring within the last 10 years: No      Prior to Admission medications   Medication Sig Start Date  End Date Taking? Authorizing Provider  albuterol (VENTOLIN HFA) 108 (90 Base) MCG/ACT inhaler Inhale 1-2 puffs into the lungs every 6 (six) hours as needed for wheezing or shortness of breath. 12/28/20  Yes McGowen, Adrian Blackwater, MD  ALPRAZolam Duanne Moron) 1 MG tablet TAKE 1 TABLET THREE TIMES DAILY AS NEEDED FOR ANXIETY. Patient taking differently: Take 1 mg by mouth 3 (three) times daily as needed for anxiety. 10/11/21  Yes McGowen, Adrian Blackwater, MD  aspirin 81 MG tablet  Take 81 mg by mouth at bedtime.   Yes [provider]  diclofenac Sodium (VOLTAREN) 1 % GEL Apply 2 g topically 4 (four) times daily. 4 times a day to both knees Patient taking differently: Apply 2 g topically 4 (four) times daily as needed (knee pain). 05/11/21  Yes Bayard Hugger, NP  DULoxetine (CYMBALTA) 30 MG capsule TAKE 1 CAPSULE ONCE DAILY ALONG WITH '60MG'$  CAPSULE FOR TOTAL '90MG'$  DAILY. Patient taking differently: Take 30 mg by mouth daily. Take '30mg'$  with '60mg'$  to equal '90mg'$  oral daily in the evening. 01/23/22  Yes McGowen, Adrian Blackwater, MD  DULoxetine (CYMBALTA) 60 MG capsule TAKE 1 CAPSULE A DAY TO BE COMBINED WITH '30MG'$  CAPSULE Patient taking differently: Take 60 mg by mouth daily. Take '60mg'$  with '30mg'$  to equal '90mg'$  oral daily in the evening. 01/23/22  Yes McGowen, Adrian Blackwater, MD  EPINEPHrine 0.3 mg/0.3 mL IJ SOAJ injection Inject 0.3 mg into the muscle as needed for anaphylaxis. 12/28/20  Yes McGowen, Adrian Blackwater, MD  fluconazole (DIFLUCAN) 100 MG tablet TAKE (1) TABLET DAILY AS NEEDED FOR THRUSH Patient taking differently: Take 100 mg by mouth daily as needed (thrush symptoms). 12/31/21  Yes McGowen, Adrian Blackwater, MD  furosemide (LASIX) 40 MG tablet Take 1 tablet (40 mg total) by mouth daily as needed for fluid. 10/16/21  Yes Lelon Perla, MD  ipratropium (ATROVENT) 0.03 % nasal spray Place 2 sprays into both nostrils every 12 (twelve) hours as needed (allergies). 02/17/22  Yes Aline August, MD  isosorbide mononitrate (IMDUR) 30 MG 24 hr  tablet Take 2 tablets (60 mg total) by mouth daily. TAKE 2 TABLETS IN THE AM AND 1 TABLET IN THE PM. Patient taking differently: Take 30-60 mg by mouth See admin instructions. Taking 2 tablets (60 mg) in the AM and 1 tablet  '30mg'$  in the evening 02/17/22  Yes Aline August, MD  lamoTRIgine (LAMICTAL) 150 MG tablet TAKE 1 TABLET EACH DAY. Patient taking differently: Take 150 mg by mouth daily. 11/12/21  Yes McGowen, Adrian Blackwater, MD  methocarbamol (ROBAXIN) 500 MG tablet Take 1 tablet (500 mg total) by mouth every 6 (six) hours as needed for muscle spasms. 02/17/22  Yes Aline August, MD  metoprolol succinate (TOPROL-XL) 100 MG 24 hr tablet take ONE tab daily, take additional 1/2 tab as needed FOR FOR BLOOD PRESSURE > 160/90 Patient taking differently: Take 50-100 mg by mouth See admin instructions. take '100mg'$  tab daily, take additional '50mg'$  as needed FOR FOR BLOOD PRESSURE > 160/90 12/12/21  Yes McGowen, Adrian Blackwater, MD  Multiple Vitamin (MULTIVITAMIN WITH MINERALS) TABS tablet Take 1 tablet by mouth daily.   Yes [provider]  nitroGLYCERIN (NITROSTAT) 0.4 MG SL tablet Place 1 tablet (0.4 mg total) under the tongue every 5 (five) minutes as needed for chest pain (x 3 doses). 10/16/21  Yes Lelon Perla, MD  oxybutynin (DITROPAN) 5 MG tablet Take 1 tablet (5 mg total) by mouth 3 (three) times daily. 01/11/22  Yes McGowen, Adrian Blackwater, MD  oxyCODONE-acetaminophen (PERCOCET) 10-325 MG tablet Take 1 tablet by mouth See admin instructions. 1 tablet oral twice daily, may take an additional 2 tablets through out the day as needed for pain 02/26/22  Yes Meredith Staggers, MD  pantoprazole (PROTONIX) 40 MG tablet TAKE 1 TABLET BY MOUTH TWICE DAILY. Patient taking differently: Take 40 mg by mouth 2 (two) times daily. 10/16/21  Yes McGowen, Adrian Blackwater, MD  polyvinyl alcohol (LIQUIFILM TEARS) 1.4 % ophthalmic solution Place  1 drop into both eyes as needed for dry eyes.   Yes [provider]  rosuvastatin  (CRESTOR) 20 MG tablet Take 1 tablet (20 mg total) by mouth every evening. 02/17/22  Yes Aline August, MD  traZODone (DESYREL) 50 MG tablet TAKE 2-4 TABLETS AT BEDTIME Patient taking differently: Take 150-200 mg by mouth at bedtime. 10/01/21  Yes McGowen, Adrian Blackwater, MD  donepezil (ARICEPT) 10 MG tablet Take 1 tablet (10 mg total) by mouth at bedtime. must have office visit Patient not taking: Reported on 02/16/2022 05/16/21   Tammi Sou, MD    ___________________________________________________________________________________________________ Physical Exam: Vitals with BMI 02/26/2022 02/26/2022 02/26/2022  Height - - -  Weight - - -  BMI - - -  Systolic 212 248 250  Diastolic 86 52 87  Pulse 67 62 71     1. General:  in No  Acute distress   Chronically ill   -appearing 2. Psychological: Alert and  Oriented 3. Head/ENT:    Dry Mucous Membranes                          Head   traumatic, neck supple                           Poor Dentition 4. SKIN: decreased Skin turgor,  Skin clean Dry and intact no rash 5. Heart: Regular rate and rhythm no  Murmur, no Rub or gallop 6. Lungs:  no wheezes or crackles   7. Abdomen: Soft,  non-tender, Non distended   obese  bowel sounds present 8. Lower extremities: no clubbing, cyanosis, no  edema 9. Neurologically  strength 5 out of 5 in all 4 extremities cranial nerves II through XII intact 10. MSK: Normal range of motion    Chart has been reviewed  ______________________________________________________________________________________________  Assessment/Plan 76 y.o. female with medical history significant of    diastolic CHF, nonobstructive CAD, COPD, essential hypertension, OSA on CPAP, recurrent UTIs, small PE in 2014 briefly on Coumadin for 8-9 months with an acute GIB in 2015 while on Coumadin, and a recent fall on 2/25 with subsequent right maxillary sinus and right orbital floor fracture with mild depression    Admitted for  acute  encephalopathy and thoracic vertibral fracture  Present on Admission:  Acute encephalopathy  HTN (hypertension), benign  OSA (obstructive sleep apnea)  Chronic diastolic CHF, NYHA class 1  History of pulmonary embolism  COPD (chronic obstructive pulmonary disease) ([place])  Fall  Thoracic vertebral fracture ([place])     Acute encephalopathy   - most likely multifactorial secondary to combination of  mild dehydration secondary to decreased by mouth intake,  polypharmacy   - Will rehydrate     - Hold contributing medications   - if no improvement may need further imaging to evaluate for CNS pathology pathology such as MRI of the brain   - neurological exam appears to be nonfocal but patient unable to cooperate fully   - VBG ordered   - no history of liver disease ammonia ordered   HTN (hypertension), benign Resume as able to tolerate  OSA (obstructive sleep apnea) Refuses CPAP on 3L of O2 at night  Chronic diastolic CHF, NYHA class 1 - currently appears to be slightly on the dry side, hold home diuretics for tonight and restart when appears euvolemic, carefuly follow fluid status and Cr   History of pulmonary embolism Did not tolerate  anticoagulation in the past resulting in bleeding  COPD (chronic obstructive pulmonary disease) (HCC) Chronic stable continue home medications  Fall PT OT assessment and treatment  Thoracic vertebral fracture ([place]) Pain management and outpatient follow-up with neurosurgery versus may need IR consult for kyphoplasty    Other plan as per orders.  DVT prophylaxis:  SCD     Code Status:    Code Status: Prior FULL CODE as per patient   I had personally discussed CODE STATUS with patient     Family Communication:   Family not at  Bedside    Disposition Plan:     likely will need placement for rehabilitation                          Following barriers for discharge:                                                          Pain controlled  with PO medications                                                  Would benefit from PT/OT eval prior to DC  Ordered                   Swallow eval - SLP ordered                                       Transition of care consulted                   Nutrition    consulted                                    Consults called: none  Admission status:  ED Disposition     ED Disposition  Admit   Condition  --   [place]: [place] [[id number]]  Level of Care: Telemetry Medical [104]  May place patient in observation at [facility] or [place] if equivalent level of care is available:: No  Covid Evaluation: Asymptomatic Screening Protocol (No Symptoms)  Diagnosis: Acute encephalopathy [025852]  Admitting Physician: Toy Baker [[id number]]  Attending Physician: Toy Baker [[id number]]           Obs      I Expect 2 midnight stay secondary to severity of patient's current illness     Level of care     tele  For 12H    Lab Results  Component Value Date   [place] 02/16/2022     Precautions: admitted as  asymptomatic screening protocol     Keatyn Luck 02/27/2022, 12:31 AM    [facility]     after 2 AM please page floor coverage PA If 7AM-7PM, please contact the day team taking care of the patient using [email or web address]   Patient was evaluated in the context of the global COVID-19 pandemic, which necessitated consideration that the patient might  be at risk for infection with the SARS-CoV-2 virus that causes COVID-19. Institutional protocols and algorithms that pertain to the evaluation of patients at risk for COVID-19 are in a state of rapid change based on information released by regulatory bodies including the CDC and federal and state organizations. These policies and algorithms were followed during the patient's care.

## 2022-02-27 ENCOUNTER — Encounter (HOSPITAL_COMMUNITY): Payer: Self-pay | Admitting: Internal Medicine

## 2022-02-27 DIAGNOSIS — G9340 Encephalopathy, unspecified: Secondary | ICD-10-CM | POA: Diagnosis not present

## 2022-02-27 DIAGNOSIS — S22080A Wedge compression fracture of T11-T12 vertebra, initial encounter for closed fracture: Secondary | ICD-10-CM | POA: Diagnosis not present

## 2022-02-27 DIAGNOSIS — I5032 Chronic diastolic (congestive) heart failure: Secondary | ICD-10-CM | POA: Diagnosis not present

## 2022-02-27 DIAGNOSIS — R479 Unspecified speech disturbances: Secondary | ICD-10-CM | POA: Diagnosis not present

## 2022-02-27 LAB — COMPREHENSIVE METABOLIC PANEL
ALT: 12 U/L (ref 0–44)
AST: 21 U/L (ref 15–41)
Albumin: 4.3 g/dL (ref 3.5–5.0)
Alkaline Phosphatase: 99 U/L (ref 38–126)
Anion gap: 14 (ref 5–15)
BUN: 6 mg/dL — ABNORMAL LOW (ref 8–23)
CO2: 27 mmol/L (ref 22–32)
Calcium: 9.4 mg/dL (ref 8.9–10.3)
Chloride: 100 mmol/L (ref 98–111)
Creatinine, Ser: 0.7 mg/dL (ref 0.44–1.00)
GFR, Estimated: >60 mL/min (ref >60)
Glucose, Bld: 82 mg/dL (ref 70–99)
Potassium: 3 mmol/L — ABNORMAL LOW (ref 3.5–5.1)
Sodium: 141 mmol/L (ref 135–145)
Total Bilirubin: 0.6 mg/dL (ref 0.3–1.2)
Total Protein: 8.3 g/dL — ABNORMAL HIGH (ref 6.5–8.1)

## 2022-02-27 LAB — RESP PANEL BY RT-PCR (FLU A&B, COVID) ARPGX2
Influenza A by PCR: NEGATIVE
Influenza B by PCR: NEGATIVE
SARS Coronavirus 2 by RT PCR: NEGATIVE

## 2022-02-27 LAB — CBC WITH DIFFERENTIAL/PLATELET
Abs Immature Granulocytes: 0.01 10*3/uL (ref 0.00–0.07)
Basophils Absolute: 0.1 10*3/uL (ref 0.0–0.1)
Basophils Relative: 1 %
Eosinophils Absolute: 0.2 10*3/uL (ref 0.0–0.5)
Eosinophils Relative: 3 %
HCT: 43 % (ref 36.0–46.0)
Hemoglobin: 14.2 g/dL (ref 12.0–15.0)
Immature Granulocytes: 0 %
Lymphocytes Relative: 37 %
Lymphs Abs: 2.3 10*3/uL (ref 0.7–4.0)
MCH: 28.4 pg (ref 26.0–34.0)
MCHC: 33 g/dL (ref 30.0–36.0)
MCV: 86 fL (ref 80.0–100.0)
Monocytes Absolute: 0.6 10*3/uL (ref 0.1–1.0)
Monocytes Relative: 10 %
Neutro Abs: 3.1 10*3/uL (ref 1.7–7.7)
Neutrophils Relative %: 49 %
Platelets: 249 10*3/uL (ref 150–400)
RBC: 5 MIL/uL (ref 3.87–5.11)
RDW: 12.1 % (ref 11.5–15.5)
WBC: 6.3 10*3/uL (ref 4.0–10.5)
nRBC: 0 % (ref 0.0–0.2)

## 2022-02-27 LAB — I-STAT VENOUS BLOOD GAS, ED
Acid-Base Excess: 6 mmol/L — ABNORMAL HIGH (ref 0.0–2.0)
Bicarbonate: 29 mmol/L — ABNORMAL HIGH (ref 20.0–28.0)
Calcium, Ion: 1.05 mmol/L — ABNORMAL LOW (ref 1.15–1.40)
HCT: 36 % (ref 36.0–46.0)
Hemoglobin: 12.2 g/dL (ref 12.0–15.0)
O2 Saturation: 99 %
Potassium: 3 mmol/L — ABNORMAL LOW (ref 3.5–5.1)
Sodium: 138 mmol/L (ref 135–145)
TCO2: 30 mmol/L (ref 22–32)
pCO2, Ven: 36.9 mmHg — ABNORMAL LOW (ref 44–60)
pH, Ven: 7.504 — ABNORMAL HIGH (ref 7.25–7.43)
pO2, Ven: 138 mmHg — ABNORMAL HIGH (ref 32–45)

## 2022-02-27 LAB — AMMONIA: Ammonia: 27 umol/L (ref 9–35)

## 2022-02-27 LAB — VITAMIN B12: Vitamin B-12: 962 pg/mL — ABNORMAL HIGH (ref 180–914)

## 2022-02-27 LAB — HEMOGLOBIN A1C
Hgb A1c MFr Bld: 5.3 % (ref 4.8–5.6)
Mean Plasma Glucose: 105.41 mg/dL

## 2022-02-27 LAB — MAGNESIUM: Magnesium: 1.8 mg/dL (ref 1.7–2.4)

## 2022-02-27 LAB — TSH: TSH: 1.488 u[IU]/mL (ref 0.350–4.500)

## 2022-02-27 LAB — PHOSPHORUS: Phosphorus: 4 mg/dL (ref 2.5–4.6)

## 2022-02-27 MED ORDER — ONDANSETRON HCL 4 MG PO TABS
4.0000 mg | ORAL_TABLET | Freq: Four times a day (QID) | ORAL | Status: DC | PRN
Start: 2022-02-27 — End: 2022-02-27

## 2022-02-27 MED ORDER — OXYCODONE HCL 5 MG PO TABS
5.0000 mg | ORAL_TABLET | Freq: Two times a day (BID) | ORAL | Status: DC | PRN
  Administered 2022-02-27: 5 mg via ORAL
  Filled 2022-02-27: qty 1

## 2022-02-27 MED ORDER — HYDROCODONE-ACETAMINOPHEN 5-325 MG PO TABS: 1.0000 | ORAL_TABLET | ORAL | Status: DC | PRN

## 2022-02-27 MED ORDER — METHOCARBAMOL 1000 MG/10ML IJ SOLN: 500.0000 mg | Freq: Four times a day (QID) | INTRAVENOUS | Status: DC | PRN

## 2022-02-27 MED ORDER — ACETAMINOPHEN 650 MG RE SUPP: 650.0000 mg | Freq: Four times a day (QID) | RECTAL | Status: DC | PRN

## 2022-02-27 MED ORDER — ISOSORBIDE MONONITRATE ER 30 MG PO TB24: 30.0000 mg | ORAL_TABLET | ORAL | Status: DC

## 2022-02-27 MED ORDER — SODIUM CHLORIDE 0.9 % IV SOLN
75.0000 mL/h | INTRAVENOUS | Status: AC
  Administered 2022-02-27: 75 mL/h via INTRAVENOUS

## 2022-02-27 MED ORDER — OXYCODONE-ACETAMINOPHEN 10-325 MG PO TABS: 1.0000 | ORAL_TABLET | ORAL | Status: DC

## 2022-02-27 MED ORDER — ROSUVASTATIN CALCIUM 20 MG PO TABS: 20.0000 mg | ORAL_TABLET | Freq: Every evening | ORAL | Status: DC

## 2022-02-27 MED ORDER — DULOXETINE HCL 60 MG PO CPEP
90.0000 mg | ORAL_CAPSULE | Freq: Every day | ORAL | Status: DC
  Administered 2022-02-27: 90 mg via ORAL
  Filled 2022-02-27: qty 1

## 2022-02-27 MED ORDER — ISOSORBIDE MONONITRATE ER 30 MG PO TB24
30.0000 mg | ORAL_TABLET | Freq: Every day | ORAL | Status: DC
Start: 2022-02-27 — End: 2022-02-27

## 2022-02-27 MED ORDER — HYDROMORPHONE HCL 1 MG/ML IJ SOLN
0.5000 mg | INTRAMUSCULAR | Status: DC | PRN
  Administered 2022-02-27: 1 mg via INTRAVENOUS
  Filled 2022-02-27: qty 1

## 2022-02-27 MED ORDER — OXYCODONE-ACETAMINOPHEN 5-325 MG PO TABS
1.0000 | ORAL_TABLET | Freq: Two times a day (BID) | ORAL | Status: DC | PRN
  Administered 2022-02-27: 1 via ORAL
  Filled 2022-02-27: qty 1

## 2022-02-27 MED ORDER — TRAZODONE HCL 50 MG PO TABS
150.0000 mg | ORAL_TABLET | Freq: Every day | ORAL | Status: DC
  Administered 2022-02-27: 150 mg via ORAL
  Filled 2022-02-27: qty 3

## 2022-02-27 MED ORDER — POTASSIUM CHLORIDE CRYS ER 20 MEQ PO TBCR
40.0000 meq | EXTENDED_RELEASE_TABLET | ORAL | Status: AC
  Administered 2022-02-27 (×2): 40 meq via ORAL
  Filled 2022-02-27 (×2): qty 2

## 2022-02-27 MED ORDER — ALPRAZOLAM 0.5 MG PO TABS
1.0000 mg | ORAL_TABLET | Freq: Three times a day (TID) | ORAL | Status: DC | PRN
  Administered 2022-02-27: 1 mg via ORAL
  Filled 2022-02-27: qty 2

## 2022-02-27 MED ORDER — IPRATROPIUM BROMIDE 0.06 % NA SOLN: 2.0000 | Freq: Two times a day (BID) | NASAL | Status: DC | PRN

## 2022-02-27 MED ORDER — DULOXETINE HCL 60 MG PO CPEP: 60.0000 mg | ORAL_CAPSULE | Freq: Every day | ORAL | Status: DC

## 2022-02-27 MED ORDER — METOPROLOL SUCCINATE ER 25 MG PO TB24: 50.0000 mg | ORAL_TABLET | ORAL | Status: DC

## 2022-02-27 MED ORDER — OXYBUTYNIN CHLORIDE 5 MG PO TABS
5.0000 mg | ORAL_TABLET | Freq: Three times a day (TID) | ORAL | Status: DC
  Administered 2022-02-27 (×2): 5 mg via ORAL
  Filled 2022-02-27 (×3): qty 1

## 2022-02-27 MED ORDER — ONDANSETRON HCL 4 MG/2ML IJ SOLN
4.0000 mg | Freq: Four times a day (QID) | INTRAMUSCULAR | Status: DC | PRN
Start: 2022-02-27 — End: 2022-02-27

## 2022-02-27 MED ORDER — ISOSORBIDE MONONITRATE ER 60 MG PO TB24
60.0000 mg | ORAL_TABLET | Freq: Every day | ORAL | Status: DC
  Administered 2022-02-27: 60 mg via ORAL
  Filled 2022-02-27: qty 1

## 2022-02-27 MED ORDER — OXYCODONE-ACETAMINOPHEN 5-325 MG PO TABS
1.0000 | ORAL_TABLET | Freq: Two times a day (BID) | ORAL | Status: DC
  Administered 2022-02-27 (×2): 1 via ORAL
  Filled 2022-02-27 (×2): qty 1

## 2022-02-27 MED ORDER — METOPROLOL SUCCINATE ER 25 MG PO TB24
100.0000 mg | ORAL_TABLET | Freq: Every day | ORAL | Status: DC
  Administered 2022-02-27: 100 mg via ORAL
  Filled 2022-02-27: qty 4

## 2022-02-27 MED ORDER — LIP MEDEX EX OINT
TOPICAL_OINTMENT | CUTANEOUS | Status: DC | PRN
  Filled 2022-02-27: qty 7

## 2022-02-27 MED ORDER — ASPIRIN 81 MG PO CHEW
81.0000 mg | CHEWABLE_TABLET | Freq: Every day | ORAL | Status: DC
  Administered 2022-02-27: 81 mg via ORAL
  Filled 2022-02-27: qty 1

## 2022-02-27 MED ORDER — OXYCODONE HCL 5 MG PO TABS
5.0000 mg | ORAL_TABLET | Freq: Two times a day (BID) | ORAL | Status: DC
  Administered 2022-02-27 (×2): 5 mg via ORAL
  Filled 2022-02-27 (×2): qty 1

## 2022-02-27 MED ORDER — PANTOPRAZOLE SODIUM 40 MG PO TBEC
40.0000 mg | DELAYED_RELEASE_TABLET | Freq: Two times a day (BID) | ORAL | Status: DC
  Administered 2022-02-27 (×2): 40 mg via ORAL
  Filled 2022-02-27 (×2): qty 1

## 2022-02-27 MED ORDER — ACETAMINOPHEN 325 MG PO TABS: 650.0000 mg | ORAL_TABLET | Freq: Four times a day (QID) | ORAL | Status: DC | PRN

## 2022-02-27 MED ORDER — DULOXETINE HCL 30 MG PO CPEP: 30.0000 mg | ORAL_CAPSULE | Freq: Every day | ORAL | Status: DC

## 2022-02-27 NOTE — TOC Initial Note (Signed)
Transition of Care (TOC) - Initial/Assessment Note  ? ? ?Patient Details  ?Name: Linda [name] ?MRN: [id number] ?Date of Birth: 16-Jun-1946 ? ?Transition of Care (TOC) CM/SW Contact:    ?Cyndi Bender, RN ?Phone Number: ?02/27/2022, 8:24 AM ? ?Clinical Narrative:               ?Patient is from independent living at [facility]. Patient has had multiple falls recently with an hospital admission in February due to fall. ?TOC will watch for therapy recommendations. ? ?Expected Discharge Plan: [place] ?Barriers to Discharge: Continued Medical Work up ? ? ?Patient Goals and CMS Choice ?  ? Does not feel safe returning to Independent living  ?  ? ?Expected Discharge Plan and Services ?Expected Discharge Plan: [place] ?  ?  ?  ?Living arrangements for the past 2 months: [place] ?                ?  ?  ?  ?  ?  ?  ?  ?  ?  ?  ? ?Prior Living Arrangements/Services ?Living arrangements for the past 2 months: [place] ?  ?  ?       ?  ?  ?  ?  ? ?Activities of Daily Living ?Home Assistive Devices/Equipment: Dentures (specify type) (upper denture only) ?ADL Screening (condition at time of admission) ?Patient's cognitive ability adequate to safely complete daily activities?: No ?Is the patient deaf or have difficulty hearing?: No ?Does the patient have difficulty seeing, even when wearing glasses/contacts?: No ?Does the patient have difficulty concentrating, remembering, or making decisions?: Yes ?Patient able to express need for assistance with ADLs?: Yes ?Does the patient have difficulty dressing or bathing?: Yes ?Independently performs ADLs?: No ?Communication: Independent ?Dressing (OT): Needs assistance ?Is this a change from baseline?: Change from baseline, expected to last <3days ?Grooming: Needs assistance ?Is this a change from baseline?: Change from baseline, expected to last <3 days ?Feeding: Independent ?Bathing: Needs assistance ?Is this  a change from baseline?: Change from baseline, expected to last <3 days ?Toileting: Needs assistance ?Is this a change from baseline?: Change from baseline, expected to last <3 days ?In/Out Bed: Needs assistance ?Is this a change from baseline?: Change from baseline, expected to last <3 days ?Walks in Home: Needs assistance ?Is this a change from baseline?: Change from baseline, expected to last <3 days ?Does the patient have difficulty walking or climbing stairs?: Yes ?Weakness of Legs: Both ?Weakness of Arms/Hands: None ? ?Permission Sought/Granted ?  ?  ?   ?   ?   ?   ? ?Emotional Assessment ?  ?  ?  ?  ?  ?  ? ?Admission diagnosis:  Acute encephalopathy [G93.40] ?Compression fracture of T12 vertebra, initial encounter ([place]) [R48.546E] ?Speech disturbance, unspecified type [R47.9] ?Patient Active Problem List  ? Diagnosis Date Noted  ? Thoracic vertebral fracture ([place]) 02/26/2022  ? Fall 02/16/2022  ? Maxillary fracture ([place]) 02/16/2022  ? Right orbital fracture ([place]) 02/16/2022  ? Right ureteral stone 11/30/2021  ? Chronic pain of left knee 10/18/2021  ? Spondylosis of lumbar spine 08/23/2021  ? Acute encephalopathy 11/24/2020  ? Dysphagia 06/15/2020  ? Allergic reaction 02/01/2020  ? Idiopathic angioedema 01/20/2020  ? Dysarthria 01/20/2020  ? Hypotension 10/04/2019  ? COPD (chronic obstructive pulmonary disease) ([place]) 01/21/2018  ? Pulmonary asbestosis ([place]) 01/21/2018  ? Hypomagnesemia 01/02/2018  ? Failure to thrive in adult 01/01/2018  ? Altered mental  status 10/21/2017  ? Lumbar radiculitis 02/10/2017  ? Low serum erythropoietin level 10/17/2016  ? RBBB 09/23/2016  ? Primary osteoarthritis of left knee 06/19/2016  ? Debilitated patient 06/06/2016  ? CHF (congestive heart failure) ([place]) 05/30/2016  ? Chronic respiratory failure with hypoxia ([place]) 02/07/2016  ? Restrictive lung disease 02/07/2016  ? Rotator cuff syndrome of right shoulder 01/10/2016  ? Encephalopathy, metabolic 32/20/2542  ? Fall at home  01/01/2016  ? Rhabdomyolysis 01/01/2016  ? Diastolic heart failure ([place]) 10/16/2015  ? CAD-minor 2014 08/16/2015  ? Chest pain with moderate risk for cardiac etiology 08/16/2015  ? Narrowing of intervertebral disc space 07/17/2015  ? Bilateral lower leg pain 01/24/2015  ? 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 Chronic pain syndrome 11/21/2014  ? Anxiety and depression 11/21/2014  ? Hypokalemia 11/21/2014  ? Hyperlipidemia 11/21/2014  ? Chronic pain disorder 11/21/2014  ? Primary osteoarthritis of right knee 10/18/2014  ? Pernicious anemia 08/24/2014  ? Generalized anxiety disorder--with occasional panic attacks.  08/05/2014  ? Recurrent major depression-severe ([place]) 08/05/2014  ? Multifactorial gait disorder 07/26/2014  ? Epistaxis 07/18/2014  ? Acute GI bleeding 07/17/2014  ? Anemia associated with acute blood loss 07/17/2014  ? Syncope 07/17/2014  ? History of pulmonary embolism 07/17/2014  ? GI bleed 07/17/2014  ? Arthritis 05/11/2014  ? DDD (degenerative disc disease) 05/11/2014  ? Fibrositis 05/11/2014  ? Amianthosis ([place]) 05/11/2014  ? [name] ([place]) 05/11/2014  ? Grief 04/28/2014  ? OSA (obstructive sleep apnea) 04/24/2014  ? Chronic diastolic CHF, NYHA class 1 04/24/2014  ? Atelectasis 08/06/2013  ? Acute pulmonary embolism ([place]) 08/04/2013  ? Pleural plaque with presence of asbestos 07/22/2013  ? Polypharmacy 04/26/2013  ? Fibromyalgia syndrome 03/01/2013  ? Insomnia 11/12/2012  ? HTN (hypertension), benign 10/25/2012  ? ?PCP:  Tammi Sou, MD ?Pharmacy:   ?[address] ?[place] [address] ?Phone: [phone] Fax: [phone] ? ? ? ? ?Social Determinants of Health (SDOH) Interventions ?  ? ?Readmission Risk Interventions ?No flowsheet data found. ? ? ?

## 2022-02-27 NOTE — Consult Note (Signed)
Providing Compassionate, Quality Care - Together   Reason for Consult: T12 compression fracture Referring Physician: Dr. [name] is an 76 y.o. female.  HPI: Linda [name] is a 76 year old female with a history significant for COPD, hypertension, degenerative disc disease, diverticular disease, OSA, recurrent UTIs, and small PE. She presented to the [facility] ED with confusion and multiple falls. She is on chronic pain medication and her confusion is felt to be related to polypharmacy. She reported back pain and CT images of her spine were obtained. She was found to have an acute compression fracture of T12 with mild 25% height loss and mild chronic compression deformities involving T1, T3, T5, T7 and T9 vertebral bodies. Neurosurgery was consulted for further evaluation and recommendations.  Past Medical History:  Diagnosis Date   Acute upper GI bleed 06/2014   while pt taking coumadin, plavix, and meloxicam---despite being told not to take coumadin.   Anginal pain ([place])    Nonobstructive CAD 2014; however, her cardiologist put her on a statin for this and NOT for hyperlipidemia per pt report.  Atyp CP 08/2017 at card f/u, plan for myoc perf imaging.   Anxiety    panic attacks   Asthma    w/ asbestososis    BPPV (benign paroxysmal positional vertigo) 12/16/2012   Chronic diastolic CHF (congestive heart failure) (HCC)    dry wt as of 11/06/16 is 168 lbs.   Chronic lower back pain    COPD (chronic obstructive pulmonary disease) (HCC)    DDD (degenerative disc disease)    lumbar and cervical.    Diverticular disease    Fibromyalgia    Patient states dx was around her late 13s but she had sx's for years prior to this.   H/O hiatal hernia    History of pneumonia    hospitalized 12/2011, 02/2013, and 07/2013 [facility]) for this   HTN (hypertension)    Renal artery dopplers 04/2013 neg for stenosis.   Hypervitaminosis D 09/27/2019   over-supplemented.  Stopped vit D and plan  recheck 2 mo.   Idiopathic angio-edema-urticaria 72014; 2021   Angioedema component was very minimal.  2021->Dr. Bobbitt (allergist) eval.   Insomnia    Iron deficiency anemia    Hematologist in [place], [place] did extensive w/u; no cause found; failed oral supplement;; gets fairly regular ([id number] so) IV iron infusions (Venofer -iron sucrose- '200mg'$  with procrit.  "for 14 yr I've been getting blood work q month & getting infusions prn" (07/12/2013).  Dr. EMarin [name], iron infusions done, EPO deficiency dx'd   Kidney stone    Migraine syndrome    "not as often anymore; used to be ~ q wk" (07/12/2013)   Mixed incontinence urge and stress    Nephrolithiasis    "passed all on my own or they are still in there" (07/12/2013)   Neuroleptic induced parkinsonism (HCC) 2018   Dr. TCarles [name] neuro, saw her 11/24/17 and recommended d/c of abilify as first step.  D/c'd abilify and pt got complete recovery.   Oropharyngeal dysphagia    swallowing study speech path 05/2020. Gastric bx's showed gastritis, h pylori NEG   OSA on CPAP    prior to move to [place]--had another sleep study 10/2015 w/pulm Dr. ECamillo [name]   Osteoarthritis    "severe; progressing fast" (07/12/2013); multiple joints-not surgical candidate for TKR (03/2015).  Triamcinolon knee injections by Dr. STessa Lerner1/2019.   Pernicious anemia 08/24/2014   Pleural plaque with presence of asbestos  07/22/2013   Pulmonary embolism ([place]) 07/2013   Dx at [facility] with very small peripheral upper lobe pe 07/2013: pt took coumadin for about 8-9 mo   Pyelonephritis    "several times over the last 30 yr" (07/12/2013)   RBBB (right bundle branch block)    Recurrent major depression ([place])    Recurrent UTI    hx of hospitalization for pyelonephritis; started abx prophylaxis 06/2015   Syncope    Hypotensive; ED visit--Dr. Terrence [name] did Cath--nonobstructive CAD, EF 55-60%.  In retrospect, suspect pt rx med misuse/polypharmacy    Past Surgical History:  Procedure Laterality Date    APPENDECTOMY  1960   AXILLARY SURGERY Left 1978   Multiple "lump" in armpit per pt   BIOPSY  06/17/2020   Gastric bx->gastritis, h pylori neg.  Procedure: BIOPSY;  Surgeon: Linda Ada, MD;  Location: [facility];  Service: Endoscopy;;   CARDIAC CATHETERIZATION  01/2013   nonobstructive CAD, EF 55-60%   CARDIOVASCULAR STRESS TEST  02/22/15   Low risk myocard perf imaging; wall motion normal, normal EF   carotid duplex doppler  10/21/2017   R vertebral flow suggestive of possible distal obstruction.  Pt declined further w/u as of 10/29/17 but need to revisit this problem periodically.   COCCYX REMOVAL  1972   DEXA  06/05/2017   T-score -3.1   DILATION AND CURETTAGE OF UTERUS  ? 1970's   ESOPHAGOGASTRODUODENOSCOPY N/A 07/19/2014   Gastritis found + in the setting of supratherapeutic INR, +plavix, + meloxicam.   ESOPHAGOGASTRODUODENOSCOPY (EGD) WITH PROPOFOL N/A 06/17/2020   NO stricture or other prob to explain pt's dysphagia, dilation was done anyway.  Gastric bx's-->gastritis, h pylori neg. Procedure: ESOPHAGOGASTRODUODENOSCOPY (EGD) WITH PROPOFOL;  Surgeon: Linda Ada, MD;  Location: [facility];  Service: Endoscopy;  Laterality: N/A;   EYE SURGERY Left 2012-2013   "injections for ~ 1 yr; don't really know what for" (07/12/2013)   HEEL SPUR SURGERY Left 2008   KNEE SURGERY  2005   LEFT HEART CATHETERIZATION WITH CORONARY ANGIOGRAM N/A 01/30/2013   Procedure: LEFT HEART CATHETERIZATION WITH CORONARY ANGIOGRAM;  Surgeon: Linda Demark, MD;  Location: [facility];  Service: Cardiovascular;  Laterality: N/A;   MALONEY DILATION  06/17/2020   Procedure: Linda [name] DILATION;  Surgeon: Linda Ada, MD;  Location: [facility];  Service: Endoscopy;;   PLANTAR FASCIA RELEASE Left 2008   SPIROMETRY  04/25/14   In hosp for acute asthma/COPD flare: mixed obstructive and restrictive lung disease. The FEV1 is severely reduced at 45% predicted.  FEV1 signif decreased compared to prior spirometry 07/23/13.    TENDON RELEASE  1996   Right forearm and hand   TOTAL ABDOMINAL HYSTERECTOMY  1974   TRANSTHORACIC ECHOCARDIOGRAM  01/2013; 04/2014;08/2015; 09/2017   2014--NORMAL.  2015--focal basal septal hypertrophy, EF 55-60%, grade I diast dysfxn, mild LAE.  08/2015 EF 55-60%, nl LV syst fxn, grade I DD, valves wnl. 10/21/17: EF 65-70%, grd I DD, o/w normal.    Family History  Problem Relation Age of Onset   Arthritis Mother    Kidney disease Mother    Heart disease Father    Stroke Father    Hypertension Father    Diabetes Father    Heart attack Father    Heart attack Paternal Grandmother    Diabetes Sister        one sister   Hypertension Sister    Asthma Sister    Hypertension Brother    Asthma Brother    Asthma Daughter  Multiple sclerosis Son     Social History:  reports that she has never smoked. She has never used smokeless tobacco. She reports that she does not drink alcohol and does not use drugs.  Allergies:  Allergies  Allergen Reactions   Abilify [Aripiprazole] Other (See Comments)    parkinsonism   Bactrim [Sulfamethoxazole-Trimethoprim] Rash   Penicillins Itching, Swelling and Rash    Tolerated Cefepime in ED. Has patient had a PCN reaction causing immediate rash, facial/tongue/throat swelling, SOB or lightheadedness with hypotension: Yes Has patient had a PCN reaction causing severe rash involving mucus membranes or skin necrosis: No Has patient had a PCN reaction that required hospitalization: No  Has patient had a PCN reaction occurring within the last 10 years: No     Medications: I have reviewed the patient's current medications.  Results for orders placed or performed during the hospital encounter of 02/26/22 (from the past 48 hour(s))  CBG monitoring, ED     Status: Abnormal   Collection Time: 02/26/22 11:47 AM  Result Value Ref Range   Glucose-Capillary 102 (H) 70 - 99 mg/dL    Comment: Glucose reference range applies only to samples taken after fasting for  at least 8 hours.  Protime-INR     Status: None   Collection Time: 02/26/22 11:51 AM  Result Value Ref Range   Prothrombin Time 13.5 11.4 - 15.2 seconds   INR 1.0 0.8 - 1.2    Comment: (NOTE) INR goal varies based on device and disease states. Performed at [facility], [place] [address] [place], [place] [address]   APTT     Status: None   Collection Time: 02/26/22 11:51 AM  Result Value Ref Range   aPTT 25 24 - 36 seconds    Comment: Performed at [facility] [address], [place], [place] [address]  CBC     Status: Abnormal   Collection Time: 02/26/22 11:51 AM  Result Value Ref Range   WBC 8.2 4.0 - 10.5 K/uL   RBC 4.07 3.87 - 5.11 MIL/uL   Hemoglobin 11.8 (L) 12.0 - 15.0 g/dL   HCT 36.1 36.0 - 46.0 %   MCV 88.7 80.0 - 100.0 fL   MCH 29.0 26.0 - 34.0 pg   MCHC 32.7 30.0 - 36.0 g/dL   RDW 12.1 11.5 - 15.5 %   Platelets 222 150 - 400 K/uL   nRBC 0.0 0.0 - 0.2 %    Comment: Performed at [facility], [place] [address], [place], [place] [address]  Differential     Status: None   Collection Time: 02/26/22 11:51 AM  Result Value Ref Range   Neutrophils Relative % 68 %   Neutro Abs 5.6 1.7 - 7.7 K/uL   Lymphocytes Relative 23 %   Lymphs Abs 1.9 0.7 - 4.0 K/uL   Monocytes Relative 7 %   Monocytes Absolute 0.5 0.1 - 1.0 K/uL   Eosinophils Relative 1 %   Eosinophils Absolute 0.1 0.0 - 0.5 K/uL   Basophils Relative 1 %   Basophils Absolute 0.0 0.0 - 0.1 K/uL   Immature Granulocytes 0 %   Abs Immature Granulocytes 0.02 0.00 - 0.07 K/uL    Comment: Performed at [facility], [place] [address], [place], [place] [address]  Comprehensive metabolic panel     Status: Abnormal   Collection Time: 02/26/22 11:51 AM  Result Value Ref Range   Sodium 139 135 - 145 mmol/L   Potassium 3.9  3.5 - 5.1 mmol/L   Chloride 105 98 - 111 mmol/L   CO2 26 22 - 32 mmol/L   Glucose, Bld 103 (H) 70 - 99 mg/dL    Comment: Glucose reference range applies only to samples taken after  fasting for at least 8 hours.   BUN 9 8 - 23 mg/dL   Creatinine, Ser 0.83 0.44 - 1.00 mg/dL   Calcium 8.8 (L) 8.9 - 10.3 mg/dL   Total Protein 6.6 6.5 - 8.1 g/dL   Albumin 3.6 3.5 - 5.0 g/dL   AST 22 15 - 41 U/L   ALT 12 0 - 44 U/L   Alkaline Phosphatase 86 38 - 126 U/L   Total Bilirubin 0.4 0.3 - 1.2 mg/dL   GFR, Estimated >60 >60 mL/min    Comment: (NOTE) Calculated using the CKD-EPI Creatinine Equation (2021)    Anion gap 8 5 - 15    Comment: Performed at [facility] [address] [place], [place] [address]  I-stat chem 8, ED     Status: Abnormal   Collection Time: 02/26/22 11:52 AM  Result Value Ref Range   Sodium 141 135 - 145 mmol/L   Potassium 3.9 3.5 - 5.1 mmol/L   Chloride 103 98 - 111 mmol/L   BUN 8 8 - 23 mg/dL   Creatinine, Ser 0.70 0.44 - 1.00 mg/dL   Glucose, Bld 102 (H) 70 - 99 mg/dL    Comment: Glucose reference range applies only to samples taken after fasting for at least 8 hours.   Calcium, Ion 1.14 (L) 1.15 - 1.40 mmol/L   TCO2 26 22 - 32 mmol/L   Hemoglobin 11.9 (L) 12.0 - 15.0 g/dL   HCT 35.0 (L) 36.0 - 46.0 %  Urinalysis, Routine w reflex microscopic     Status: Abnormal   Collection Time: 02/26/22  2:53 PM  Result Value Ref Range   Color, Urine STRAW (A) YELLOW   APPearance CLEAR CLEAR   Specific Gravity, Urine 1.034 (H) 1.005 - 1.030   pH 7.0 5.0 - 8.0   Glucose, UA NEGATIVE NEGATIVE mg/dL   Hgb urine dipstick NEGATIVE NEGATIVE   Bilirubin Urine NEGATIVE NEGATIVE   Ketones, ur NEGATIVE NEGATIVE mg/dL   Protein, ur NEGATIVE NEGATIVE mg/dL   Nitrite NEGATIVE NEGATIVE   Leukocytes,Ua SMALL (A) NEGATIVE   RBC / HPF 6-10 0 - 5 RBC/hpf   WBC, UA 0-5 0 - 5 WBC/hpf   Bacteria, UA NONE SEEN NONE SEEN   Squamous Epithelial / LPF 0-5 0 - 5   Non Squamous Epithelial 0-5 (A) NONE SEEN    Comment: Performed at [facility], [address], [place], [place] [address]  Resp Panel by RT-PCR (Flu A&B, Covid) Nasopharyngeal Swab     Status: None    Collection Time: 02/27/22 12:49 AM   Specimen: Nasopharyngeal Swab; Nasopharyngeal(NP) swabs in vial transport medium  Result Value Ref Range   SARS Coronavirus 2 by RT PCR NEGATIVE NEGATIVE    Comment: (NOTE) SARS-CoV-2 target nucleic acids are NOT DETECTED.  The SARS-CoV-2 RNA is generally detectable in upper respiratory specimens during the acute phase of infection. The lowest concentration of SARS-CoV-2 viral copies this assay can detect is 138 copies/mL. A negative result does not preclude SARS-Cov-2 infection and should not be used as the sole basis for treatment or other patient management decisions. A negative result may occur with  improper specimen collection/handling, submission of specimen other than nasopharyngeal swab, presence of viral  mutation(s) within the areas targeted by this assay, and inadequate number of viral copies(<138 copies/mL). A negative result must be combined with clinical observations, patient history, and epidemiological information. The expected result is Negative.  Fact Sheet for Patients:  EntrepreneurPulse.com.au  Fact Sheet for Healthcare Providers:  IncredibleEmployment.be  This test is no t yet approved or cleared by the [place] FDA and  has been authorized for detection and/or diagnosis of SARS-CoV-2 by FDA under an Emergency Use Authorization (EUA). This EUA will remain  in effect (meaning this test can be used) for the duration of the COVID-19 declaration under Section 564(b)(1) of the Act, 21 U.S.C.section 360bbb-3(b)(1), unless the authorization is terminated  or revoked sooner.       Influenza A by PCR NEGATIVE NEGATIVE   Influenza B by PCR NEGATIVE NEGATIVE    Comment: (NOTE) The Xpert Xpress SARS-CoV-2/FLU/RSV plus assay is intended as an aid in the diagnosis of influenza from Nasopharyngeal swab specimens and should not be used as a sole basis for treatment. Nasal washings  and aspirates are unacceptable for Xpert Xpress SARS-CoV-2/FLU/RSV testing.  Fact Sheet for Patients: EntrepreneurPulse.com.au  Fact Sheet for Healthcare Providers: IncredibleEmployment.be  This test is not yet approved or cleared by the [place] FDA and has been authorized for detection and/or diagnosis of SARS-CoV-2 by FDA under an Emergency Use Authorization (EUA). This EUA will remain in effect (meaning this test can be used) for the duration of the COVID-19 declaration under Section 564(b)(1) of the Act, 21 U.S.C. section 360bbb-3(b)(1), unless the authorization is terminated or revoked.  Performed at [facility], [place] [address], [place], [place] [address]   Ammonia     Status: None   Collection Time: 02/27/22 12:49 AM  Result Value Ref Range   Ammonia 27 9 - 35 umol/L    Comment: Performed at [facility], [place] [address] [place], [place] [address]  I-Stat venous blood gas, ED     Status: Abnormal   Collection Time: 02/27/22  1:56 AM  Result Value Ref Range   pH, Ven 7.504 (H) 7.25 - 7.43   pCO2, Ven 36.9 (L) 44 - 60 mmHg   pO2, Ven 138 (H) 32 - 45 mmHg   Bicarbonate 29.0 (H) 20.0 - 28.0 mmol/L   TCO2 30 22 - 32 mmol/L   O2 Saturation 99 %   Acid-Base Excess 6.0 (H) 0.0 - 2.0 mmol/L   Sodium 138 135 - 145 mmol/L   Potassium 3.0 (L) 3.5 - 5.1 mmol/L   Calcium, Ion 1.05 (L) 1.15 - 1.40 mmol/L   HCT 36.0 36.0 - 46.0 %   Hemoglobin 12.2 12.0 - 15.0 g/dL   Sample type VENOUS   Magnesium     Status: None   Collection Time: 02/27/22  4:42 AM  Result Value Ref Range   Magnesium 1.8 1.7 - 2.4 mg/dL    Comment: Performed at [facility], [place] [address], [place], [place] [address]  Phosphorus     Status: None   Collection Time: 02/27/22  4:42 AM  Result Value Ref Range   Phosphorus 4.0 2.5 - 4.6 mg/dL    Comment: Performed at [facility] [address], [place], [place] [address]  CBC WITH DIFFERENTIAL      Status: None   Collection Time: 02/27/22  4:42 AM  Result Value Ref Range   WBC 6.3 4.0 - 10.5 K/uL   RBC 5.00 3.87 - 5.11 MIL/uL  Hemoglobin 14.2 12.0 - 15.0 g/dL   HCT 43.0 36.0 - 46.0 %   MCV 86.0 80.0 - 100.0 fL   MCH 28.4 26.0 - 34.0 pg   MCHC 33.0 30.0 - 36.0 g/dL   RDW 12.1 11.5 - 15.5 %   Platelets 249 150 - 400 K/uL   nRBC 0.0 0.0 - 0.2 %   Neutrophils Relative % 49 %   Neutro Abs 3.1 1.7 - 7.7 K/uL   Lymphocytes Relative 37 %   Lymphs Abs 2.3 0.7 - 4.0 K/uL   Monocytes Relative 10 %   Monocytes Absolute 0.6 0.1 - 1.0 K/uL   Eosinophils Relative 3 %   Eosinophils Absolute 0.2 0.0 - 0.5 K/uL   Basophils Relative 1 %   Basophils Absolute 0.1 0.0 - 0.1 K/uL   Immature Granulocytes 0 %   Abs Immature Granulocytes 0.01 0.00 - 0.07 K/uL    Comment: Performed at [facility] [address] [place], [place] [address]  TSH     Status: None   Collection Time: 02/27/22  4:42 AM  Result Value Ref Range   TSH 1.488 0.350 - 4.500 uIU/mL    Comment: Performed by a 3rd Generation assay with a functional sensitivity of <=0.01 uIU/mL. Performed at [facility], [place] [address], [place], [place] [address]   Comprehensive metabolic panel     Status: Abnormal   Collection Time: 02/27/22  4:42 AM  Result Value Ref Range   Sodium 141 135 - 145 mmol/L   Potassium 3.0 (L) 3.5 - 5.1 mmol/L   Chloride 100 98 - 111 mmol/L   CO2 27 22 - 32 mmol/L   Glucose, Bld 82 70 - 99 mg/dL    Comment: Glucose reference range applies only to samples taken after fasting for at least 8 hours.   BUN 6 (L) 8 - 23 mg/dL   Creatinine, Ser 0.70 0.44 - 1.00 mg/dL   Calcium 9.4 8.9 - 10.3 mg/dL   Total Protein 8.3 (H) 6.5 - 8.1 g/dL   Albumin 4.3 3.5 - 5.0 g/dL   AST 21 15 - 41 U/L   ALT 12 0 - 44 U/L   Alkaline Phosphatase 99 38 - 126 U/L   Total Bilirubin 0.6 0.3 - 1.2 mg/dL   GFR, Estimated >60 >60 mL/min    Comment: (NOTE) Calculated using the CKD-EPI Creatinine Equation (2021)     Anion gap 14 5 - 15    Comment: Performed at [facility], [place] [address], [place], [place] [address]  Hemoglobin A1c     Status: None   Collection Time: 02/27/22  4:42 AM  Result Value Ref Range   Hgb A1c MFr Bld 5.3 4.8 - 5.6 %    Comment: (NOTE) Pre diabetes:          5.7%-6.4%  Diabetes:              >6.4%  Glycemic control for   <7.0% adults with diabetes    Mean Plasma Glucose 105.41 mg/dL    Comment: Performed at [facility] [address], [place], [place] [address]  Vitamin B12     Status: Abnormal   Collection Time: 02/27/22  4:42 AM  Result Value Ref Range   Vitamin B-12 962 (H) 180 - 914 pg/mL    Comment: (NOTE) This assay is not validated for testing neonatal or myeloproliferative syndrome specimens for Vitamin B12 levels. Performed at [facility], [address] [place], [place] [address]    *Note: Due to a large number of results and/or encounters for the requested time period, some results have not been displayed. A complete set of results can be found in Results Review.    DG Chest 2 View  Result Date: 02/27/2022 CLINICAL DATA:  Encephalopathy EXAM: CHEST - 2 VIEW COMPARISON:  02/16/2022 FINDINGS: Cardiac shadow is within normal limits. Aortic calcifications are seen. Multiple calcified pleural plaques are noted bilaterally. No focal infiltrate or effusion is seen. No acute bony abnormality is noted. Chronic compression deformity is noted in the lower thoracic spine IMPRESSION: Stable calcified pleural plaques.  No acute abnormality noted. Electronically Signed   By: Inez Catalina M.D.   On: 02/27/2022 00:18   CT Thoracic Spine Wo Contrast  Result Date: 02/26/2022 CLINICAL DATA:  Initial evaluation for acute compression fracture, back pain. EXAM: CT THORACIC AND LUMBAR SPINE WITHOUT CONTRAST TECHNIQUE: Multidetector CT imaging of the thoracic and lumbar spine was performed without contrast. Multiplanar CT image reconstructions were also  generated. RADIATION DOSE REDUCTION: This exam was performed according to the departmental dose-optimization program which includes automated exposure control, adjustment of the mA and/or kV according to patient size and/or use of iterative reconstruction technique. COMPARISON:  Prior chest CT from 09/15/2014. FINDINGS: CT THORACIC SPINE FINDINGS Alignment: Mild exaggeration of the normal thoracic kyphosis. No listhesis. Vertebrae: Acute compression fracture involving the superior endplate of W41 with mild 25% height loss and 3 mm bony retropulsion. This is benign/mechanical in appearance. No significant stenosis. No other acute fracture seen within the thoracic spine. Additional mild multilevel chronic compression deformities involving the T1, T3, T5, T7, and T9 vertebral bodies noted. No discrete or worrisome osseous lesions. Visualized ribs intact. Paraspinal and other soft tissues: Mild paraspinous edema adjacent to the acute T12 fracture. Paraspinous soft tissues demonstrate no other acute finding. Multiple partially calcified pleural plaques noted about the partially visualized lungs, suggesting prior asbestos exposure. Underlying mild pulmonary interstitial congestion noted. Aortic atherosclerosis. Disc levels: No significant disc pathology seen within the thoracic spine for age. No significant spinal stenosis. Foramina appear patent. CT LUMBAR SPINE FINDINGS Segmentation: Standard. Lowest well-formed disc space labeled the L5-S1 level. Alignment: 5 mm facet mediated anterolisthesis of L5 on S1. Alignment otherwise normal with preservation of the normal lumbar lordosis. Vertebrae: Vertebral body height maintained without acute or chronic fracture. Visualized sacrum and pelvis intact. SI joints symmetric and within normal limits. No discrete or worrisome osseous lesions. Paraspinal and other soft tissues: Paraspinous soft tissues demonstrate no acute finding. Secreted IV contrast material present within the  renal collecting systems and bladder. Aortic atherosclerosis noted. Disc levels: L1-2:  Unremarkable. L2-3: Mild circumferential disc bulge. No significant spinal stenosis. Foramina remain patent. L3-4: Moderate degenerative intervertebral disc space narrowing with diffuse disc bulge and disc desiccation. Mild facet hypertrophy. No significant spinal stenosis. Foramina remain patent. L4-5: Mild-to-moderate intervertebral disc space narrowing with diffuse disc bulge and disc desiccation. Mild to moderate facet hypertrophy. No significant spinal stenosis. Foramina remain patent. L5-S1: 5 mm anterolisthesis. Associated mild diffuse disc bulge with moderately advanced facet hypertrophy. No spinal stenosis. Foramina remain patent. IMPRESSION: CT THORACIC SPINE IMPRESSION: 1. Acute compression fracture involving the superior endplate of L24 with mild 25% height loss and 3 mm bony retropulsion. No significant stenosis. 2. Additional mild chronic compression deformities involving the T1, T3, T5, T7, and T9 vertebral bodies. 3. No other acute traumatic injury within the thoracic spine. 4. Multiple partially calcified pleural  plaques about the partially visualized lungs, suggesting prior asbestos exposure. Finding could be further assessed with dedicated cross-sectional of the chest as clinically warranted. 5. Underlying mild pulmonary interstitial congestion. CT LUMBAR SPINE IMPRESSION: 1. No acute traumatic injury within the lumbar spine. 2. Mild to moderate multilevel degenerative spondylolysis and facet arthrosis within the lumbar spine, most notable at L5-S1, where there is 5 mm facet mediated anterolisthesis. No significant stenosis or neural impingement. Aortic Atherosclerosis (ICD10-I70.0). Electronically Signed   By: Jeannine Boga M.D.   On: 02/26/2022 21:52   CT Lumbar Spine Wo Contrast  Result Date: 02/26/2022 CLINICAL DATA:  Initial evaluation for acute compression fracture, back pain. EXAM: CT THORACIC  AND LUMBAR SPINE WITHOUT CONTRAST TECHNIQUE: Multidetector CT imaging of the thoracic and lumbar spine was performed without contrast. Multiplanar CT image reconstructions were also generated. RADIATION DOSE REDUCTION: This exam was performed according to the departmental dose-optimization program which includes automated exposure control, adjustment of the mA and/or kV according to patient size and/or use of iterative reconstruction technique. COMPARISON:  Prior chest CT from 09/15/2014. FINDINGS: CT THORACIC SPINE FINDINGS Alignment: Mild exaggeration of the normal thoracic kyphosis. No listhesis. Vertebrae: Acute compression fracture involving the superior endplate of W54 with mild 25% height loss and 3 mm bony retropulsion. This is benign/mechanical in appearance. No significant stenosis. No other acute fracture seen within the thoracic spine. Additional mild multilevel chronic compression deformities involving the T1, T3, T5, T7, and T9 vertebral bodies noted. No discrete or worrisome osseous lesions. Visualized ribs intact. Paraspinal and other soft tissues: Mild paraspinous edema adjacent to the acute T12 fracture. Paraspinous soft tissues demonstrate no other acute finding. Multiple partially calcified pleural plaques noted about the partially visualized lungs, suggesting prior asbestos exposure. Underlying mild pulmonary interstitial congestion noted. Aortic atherosclerosis. Disc levels: No significant disc pathology seen within the thoracic spine for age. No significant spinal stenosis. Foramina appear patent. CT LUMBAR SPINE FINDINGS Segmentation: Standard. Lowest well-formed disc space labeled the L5-S1 level. Alignment: 5 mm facet mediated anterolisthesis of L5 on S1. Alignment otherwise normal with preservation of the normal lumbar lordosis. Vertebrae: Vertebral body height maintained without acute or chronic fracture. Visualized sacrum and pelvis intact. SI joints symmetric and within normal limits.  No discrete or worrisome osseous lesions. Paraspinal and other soft tissues: Paraspinous soft tissues demonstrate no acute finding. Secreted IV contrast material present within the renal collecting systems and bladder. Aortic atherosclerosis noted. Disc levels: L1-2:  Unremarkable. L2-3: Mild circumferential disc bulge. No significant spinal stenosis. Foramina remain patent. L3-4: Moderate degenerative intervertebral disc space narrowing with diffuse disc bulge and disc desiccation. Mild facet hypertrophy. No significant spinal stenosis. Foramina remain patent. L4-5: Mild-to-moderate intervertebral disc space narrowing with diffuse disc bulge and disc desiccation. Mild to moderate facet hypertrophy. No significant spinal stenosis. Foramina remain patent. L5-S1: 5 mm anterolisthesis. Associated mild diffuse disc bulge with moderately advanced facet hypertrophy. No spinal stenosis. Foramina remain patent. IMPRESSION: CT THORACIC SPINE IMPRESSION: 1. Acute compression fracture involving the superior endplate of O27 with mild 25% height loss and 3 mm bony retropulsion. No significant stenosis. 2. Additional mild chronic compression deformities involving the T1, T3, T5, T7, and T9 vertebral bodies. 3. No other acute traumatic injury within the thoracic spine. 4. Multiple partially calcified pleural plaques about the partially visualized lungs, suggesting prior asbestos exposure. Finding could be further assessed with dedicated cross-sectional of the chest as clinically warranted. 5. Underlying mild pulmonary interstitial congestion. CT LUMBAR SPINE IMPRESSION: 1. No acute  traumatic injury within the lumbar spine. 2. Mild to moderate multilevel degenerative spondylolysis and facet arthrosis within the lumbar spine, most notable at L5-S1, where there is 5 mm facet mediated anterolisthesis. No significant stenosis or neural impingement. Aortic Atherosclerosis (ICD10-I70.0). Electronically Signed   By: Jeannine Boga  M.D.   On: 02/26/2022 21:52   MR BRAIN WO CONTRAST  Result Date: 02/26/2022 CLINICAL DATA:  Neuro deficit, acute, stroke suspected. Slurred speech. EXAM: MRI HEAD WITHOUT CONTRAST TECHNIQUE: Multiplanar, multiecho pulse sequences of the brain and surrounding structures were obtained without intravenous contrast. COMPARISON:  CT studies earlier today.  MRI 01/20/2020. FINDINGS: Brain: Diffusion imaging does not show any acute or subacute infarction. No focal abnormality affects the brainstem or cerebellum. Cerebral hemispheres show age related volume loss with minimal small vessel change of the white matter. Old lacunar infarction in the right caudate head. No mass, hemorrhage, hydrocephalus or extra-axial collection. Vascular: Major vessels at the base of the brain show flow. Skull and upper cervical spine: Negative Sinuses/Orbits: Rhinosinusitis of the right maxillary sinus. Other sinuses clear. Orbits negative. Other: None IMPRESSION: No acute intracranial finding. Mild chronic small-vessel change of the hemispheric white matter. Old lacunar infarction of the right caudate head. Right maxillary rhinosinusitis. Electronically Signed   By: Nelson Chimes M.D.   On: 02/26/2022 18:27   CT HEAD CODE STROKE WO CONTRAST  Result Date: 02/26/2022 CLINICAL DATA:  Code stroke.  Neuro deficit, acute, stroke suspected EXAM: CT HEAD WITHOUT CONTRAST TECHNIQUE: Contiguous axial images were obtained from the base of the skull through the vertex without intravenous contrast. RADIATION DOSE REDUCTION: This exam was performed according to the departmental dose-optimization program which includes automated exposure control, adjustment of the mA and/or kV according to patient size and/or use of iterative reconstruction technique. COMPARISON:  February 15, 2022. FINDINGS: Brain: No evidence of acute large vascular territory infarction, hemorrhage, hydrocephalus, extra-axial collection or mass lesion/mass effect. Partially empty  sella. Inferior aspect of the posterior fossa is incompletely imaged. Vascular: No hyperdense vessel identified. Skull: No acute fracture. Sinuses/Orbits: Clear sinuses.  No acute orbital findings. Other: No mastoid effusions. ASPECTS [facility] Stroke Program Early CT Score) total score (0-10 with 10 being normal): 10. IMPRESSION: 1. No evidence of acute in intracranial abnormality. 2. ASPECTS is 10 Code stroke imaging results were communicated on 02/26/2022 at 12:04 pm to provider Dr. Rory Percy via secure text paging. Electronically Signed   By: Margaretha Sheffield M.D.   On: 02/26/2022 12:06   CT ANGIO HEAD NECK W WO CM W PERF (CODE STROKE)  Result Date: 02/26/2022 CLINICAL DATA:  Neuro deficit, acute, stroke suspected. EXAM: CT ANGIOGRAPHY HEAD AND NECK CT PERFUSION BRAIN TECHNIQUE: Multidetector CT imaging of the head and neck was performed using the standard protocol during bolus administration of intravenous contrast. Multiplanar CT image reconstructions and MIPs were obtained to evaluate the vascular anatomy. Carotid stenosis measurements (when applicable) are obtained utilizing NASCET criteria, using the distal internal carotid diameter as the denominator. Multiphase CT imaging of the brain was performed following IV bolus contrast injection. Subsequent parametric perfusion maps were calculated using RAPID software. RADIATION DOSE REDUCTION: This exam was performed according to the departmental dose-optimization program which includes automated exposure control, adjustment of the mA and/or kV according to patient size and/or use of iterative reconstruction technique. CONTRAST:  133m OMNIPAQUE IOHEXOL 350 MG/ML SOLN COMPARISON:  CT head without contrast 01/29/2022. FINDINGS: CTA NECK FINDINGS Aortic arch: Atherosclerotic calcifications are present about the origin of the left subclavian  artery without significant stenosis or aneurysm. Origins of the common carotid artery and innominate artery are not imaged. Right  carotid system: The right common carotid artery is within normal limits. Atherosclerotic calcifications are present at the bifurcation. Minimal luminal diameter is 2 mm. Mild tortuosity is present in the more distal right ICA without other significant stenosis. Distal ICA measures 3.8 mm. Left carotid system: The left common carotid artery is tortuous without significant stenosis. Atherosclerotic calcifications are present at the lateral aspect of the left carotid bifurcation without a significant stenosis relative to the more distal vessel. Cervical left ICA is otherwise within normal limits. Vertebral arteries: The left vertebral artery is the dominant vessel. Both vertebral arteries originate from the subclavian arteries without significant stenosis. No significant stenosis is present in either vertebral artery in the neck. Moderate stenoses are present in both vertebral arteries at the dural margin. Skeleton: Fusion noted across the disc space C5-6. Advanced degenerative changes present at C6-7 right at C4-5. No focal osseous lesions are present. Acute or healing fractures are present. Other neck: An 11 mm nodule is present in the inferior right lobe of the thyroid. The submandibular and parotid glands and ducts are within normal limits. No mucosal lesions are present. No significant adenopathy is present. Upper chest: The lung apices are clear. Thoracic inlet is within normal limits. Review of the MIP images confirms the above findings CTA HEAD FINDINGS Anterior circulation: Atherosclerotic calcifications are present within the cavernous internal carotid arteries bilaterally. No significant stenosis is present through the ICA termini. The A1 and M1 segments are normal. Anterior communicating artery is patent. MCA bifurcations are intact. ACA and MCA branch vessels are within normal limits. Posterior circulation: Atherosclerotic calcifications present at dural margin of both vertebral arteries. Moderate stenosis  is present in the hypoplastic right V3 segment. Distal V4 segment does fill. Left PICA origin is visualized and normal. Dominant right AICA vessel is present. Basilar artery is within normal limits. Both posterior cerebral arteries originate from the basilar tip. Moderate stenosis is present in the right P2 segment without a distal occlusion. Left PCA branch vessels are within normal limits. Venous sinuses: The dural sinuses are patent. The straight sinus deep cerebral veins are intact. Cortical veins are within normal limits. No significant vascular malformation is evident. Anatomic variants: None Review of the MIP images confirms the above findings CT Brain Perfusion Findings: ASPECTS: 10/10 CBF (<30%) Volume: 45m Perfusion (Tmax>6.0s) volume: 037mIMPRESSION: 1. No emergent large vessel occlusion. 2. CT perfusion demonstrates no significant core infarct or significant ischemia. 3. Atherosclerotic changes at the carotid bifurcations bilaterally, within the cavernous internal carotid arteries, and at the dural margin of both vertebral arteries. 4. Moderate stenosis in the hypoplastic right V3 segment and distal right V4 segment. The distal right V4 segment does fill. 5. Moderate stenosis of the right P2 segment. 6. Degenerative changes in the cervical spine as described. 7. 11 mm nodule in the inferior right lobe of the thyroid. Not clinically significant; no follow-up imaging recommended (ref: J Am Coll Radiol. 2015 Feb;12(2): 143-50). 8. Aortic Atherosclerosis (ICD10-I70.0). Electronically Signed   By: ChSan Morelle.D.   On: 02/26/2022 12:27    Review of Systems  Constitutional: Negative.   HENT: Negative.    Eyes: Negative.   Respiratory: Negative.    Cardiovascular: Negative.   Gastrointestinal: Negative.   Genitourinary: Negative.   Musculoskeletal:  Positive for back pain, falls and joint pain. Negative for neck pain.  Skin: Negative.  Neurological:  Negative for tingling, sensory change,  speech change, focal weakness and weakness.  Endo/Heme/Allergies: Negative.   Psychiatric/Behavioral: Negative.    Blood pressure (!) 134/58, pulse 64, temperature 97.7 F (36.5 C), temperature source Oral, resp. rate 15, height '5\' 4"'$  (1.626 m), weight 66.8 kg, SpO2 95 %. Estimated body mass index is 25.28 kg/m as calculated from the following:   Height as of this encounter: '5\' 4"'$  (1.626 m).   Weight as of this encounter: 66.8 kg.  Physical Exam Constitutional:      Appearance: Normal appearance.  HENT:     Head: Normocephalic and atraumatic.     Nose: Nose normal.     Mouth/Throat:     Mouth: Mucous membranes are dry.  Eyes:     Extraocular Movements: Extraocular movements intact.     Pupils: Pupils are equal, round, and reactive to light.  Cardiovascular:     Rate and Rhythm: Normal rate.  Pulmonary:     Effort: Pulmonary effort is normal. No respiratory distress.  Abdominal:     General: Abdomen is flat.     Palpations: Abdomen is soft.  Musculoskeletal:        General: Normal range of motion.     Cervical back: Normal range of motion.     Comments: TTP at thoracic spine  Neurological:     General: No focal deficit present.     Mental Status: She is alert and oriented to person, place, and time. Mental status is at baseline.  Psychiatric:        Mood and Affect: Mood normal.        Behavior: Behavior normal.    Assessment/Plan: Patient found to have a mild T12 compression fracture. A TLSO has been ordered. The patient can follow up with Dr. Arnoldo Morale in 2 weeks as an outpatient for follow up x-rays.  Viona Gilmore, DNP, AGNP-C Nurse Practitioner  [facility] [place] [address], [place], [place] [address] P: [phone]     F: [phone]  02/27/2022, 12:57 PM

## 2022-02-27 NOTE — Plan of Care (Signed)
  Problem: Education: Goal: Knowledge of General Education information will improve Description Including pain rating scale, medication(s)/side effects and non-pharmacologic comfort measures Outcome: Progressing   

## 2022-02-27 NOTE — Progress Notes (Signed)
Orthopedic Tech Progress Note ?Patient Details:  ?[name] ?1946/05/16 ?[id number] ?Custom Clamshell TLSO has been ordered from [facility]  ?Patient ID: Linda [name], female   DOB: [birth year], 76 y.o.   MRN: [id number] ? ?Linda [name] E Sharyl Panchal ?02/27/2022, 1:04 PM ? ?

## 2022-02-27 NOTE — TOC Initial Note (Signed)
Transition of Care (TOC) - Initial/Assessment Note  ? ? ?Patient Details  ?Name: Linda [name] ?MRN: [id number] ?Date of Birth: 12-22-1946 ? ?Transition of Care (TOC) CM/SW Contact:    ?Cyndi Bender, RN ?Phone Number: ?02/27/2022, 12:55 PM ? ?Clinical Narrative:            ? ?Spoke to patient regarding transition needs.  ?Patient lives in independent living, [facility]. Patient states she feels safe returning to [facility].  ?Patient has all needed DME at home.  ?TOC will continue to follow for needs. ?Patient will need resumption orders for Home Health PT/OT ? ? ?Expected Discharge Plan: [place] ?Barriers to Discharge: Continued Medical Work up ? ? ?Patient Goals and CMS Choice ?  ? Return home ?  ? ?Expected Discharge Plan and Services ?Expected Discharge Plan: [place] ?  ?Discharge Planning Services: CM Consult ?  ?Living arrangements for the past 2 months: [place] ?                ?  ?  ?  ?  ?  ?  ?  ?  ?  ?  ? ?Prior Living Arrangements/Services ?Living arrangements for the past 2 months: [place] ?Lives with:: Self ?Patient language and need for interpreter reviewed:: Yes ?Do you feel safe going back to the place where you live?: Yes      ?Need for Family Participation in Patient Care: Yes (Comment) ?Care giver support system in place?: Yes (comment) ?  ?Criminal Activity/Legal Involvement Pertinent to Current Situation/Hospitalization: No - Comment as needed ? ?Activities of Daily Living ?Home Assistive Devices/Equipment: Dentures (specify type) (upper denture only) ?ADL Screening (condition at time of admission) ?Patient's cognitive ability adequate to safely complete daily activities?: No ?Is the patient deaf or have difficulty hearing?: No ?Does the patient have difficulty seeing, even when wearing glasses/contacts?: No ?Does the patient have difficulty concentrating, remembering, or making decisions?:  Yes ?Patient able to express need for assistance with ADLs?: Yes ?Does the patient have difficulty dressing or bathing?: Yes ?Independently performs ADLs?: No ?Communication: Independent ?Dressing (OT): Needs assistance ?Is this a change from baseline?: Change from baseline, expected to last <3days ?Grooming: Needs assistance ?Is this a change from baseline?: Change from baseline, expected to last <3 days ?Feeding: Independent ?Bathing: Needs assistance ?Is this a change from baseline?: Change from baseline, expected to last <3 days ?Toileting: Needs assistance ?Is this a change from baseline?: Change from baseline, expected to last <3 days ?In/Out Bed: Needs assistance ?Is this a change from baseline?: Change from baseline, expected to last <3 days ?Walks in Home: Needs assistance ?Is this a change from baseline?: Change from baseline, expected to last <3 days ?Does the patient have difficulty walking or climbing stairs?: Yes ?Weakness of Legs: Both ?Weakness of Arms/Hands: None ? ?Permission Sought/Granted ?Permission sought to share information with : Case Manager ?Permission granted to share information with : Yes, Verbal Permission Granted ?   ? Permission granted to share info w AGENCY: home health ?   ?   ? ?Emotional Assessment ?Appearance:: Appears stated age ?Attitude/Demeanor/Rapport: Engaged ?Affect (typically observed): Accepting ?Orientation: : Oriented to Self, Oriented to Place, Oriented to  Time, Oriented to Situation ?Alcohol / Substance Use: Not Applicable ?Psych Involvement: No (comment) ? ?Admission diagnosis:  Acute encephalopathy [G93.40] ?Compression fracture of T12 vertebra, initial encounter ([place]) [W43.154M] ?Speech disturbance, unspecified type [R47.9] ?Patient Active Problem List  ?  Diagnosis Date Noted  ? Thoracic vertebral fracture ([place]) 02/26/2022  ? Fall 02/16/2022  ? Maxillary fracture ([place]) 02/16/2022  ? Right orbital fracture ([place]) 02/16/2022  ? Right ureteral stone 11/30/2021  ?  Chronic pain of left knee 10/18/2021  ? Spondylosis of lumbar spine 08/23/2021  ? Acute encephalopathy 11/24/2020  ? Dysphagia 06/15/2020  ? Allergic reaction 02/01/2020  ? Idiopathic angioedema 01/20/2020  ? Dysarthria 01/20/2020  ? Hypotension 10/04/2019  ? COPD (chronic obstructive pulmonary disease) ([place]) 01/21/2018  ? Pulmonary asbestosis ([place]) 01/21/2018  ? Hypomagnesemia 01/02/2018  ? Failure to thrive in adult 01/01/2018  ? Altered mental status 10/21/2017  ? Lumbar radiculitis 02/10/2017  ? Low serum erythropoietin level 10/17/2016  ? RBBB 09/23/2016  ? Primary osteoarthritis of left knee 06/19/2016  ? Debilitated patient 06/06/2016  ? CHF (congestive heart failure) ([place]) 05/30/2016  ? Chronic respiratory failure with hypoxia ([place]) 02/07/2016  ? Restrictive lung disease 02/07/2016  ? Rotator cuff syndrome of right shoulder 01/10/2016  ? Encephalopathy, metabolic 89/21/1941  ? Fall at home 01/01/2016  ? Rhabdomyolysis 01/01/2016  ? Diastolic heart failure ([place]) 10/16/2015  ? CAD-minor 2014 08/16/2015  ? Chest pain with moderate risk for cardiac etiology 08/16/2015  ? Narrowing of intervertebral disc space 07/17/2015  ? Bilateral lower leg pain 01/24/2015  ? 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 Chronic pain syndrome 11/21/2014  ? Anxiety and depression 11/21/2014  ? Hypokalemia 11/21/2014  ? Hyperlipidemia 11/21/2014  ? Chronic pain disorder 11/21/2014  ? Primary osteoarthritis of right knee 10/18/2014  ? Pernicious anemia 08/24/2014  ? Generalized anxiety disorder--with occasional panic attacks.  08/05/2014  ? Recurrent major depression-severe ([place]) 08/05/2014  ? Multifactorial gait disorder 07/26/2014  ? Epistaxis 07/18/2014  ? Acute GI bleeding 07/17/2014  ? Anemia associated with acute blood loss 07/17/2014  ? Syncope 07/17/2014  ? History of pulmonary embolism 07/17/2014  ? GI bleed 07/17/2014  ? Arthritis 05/11/2014  ? DDD (degenerative disc disease) 05/11/2014  ? Fibrositis 05/11/2014  ?  Amianthosis ([place]) 05/11/2014  ? [name] ([place]) 05/11/2014  ? Grief 04/28/2014  ? OSA (obstructive sleep apnea) 04/24/2014  ? Chronic diastolic CHF, NYHA class 1 04/24/2014  ? Atelectasis 08/06/2013  ? Acute pulmonary embolism ([place]) 08/04/2013  ? Pleural plaque with presence of asbestos 07/22/2013  ? Polypharmacy 04/26/2013  ? Fibromyalgia syndrome 03/01/2013  ? Insomnia 11/12/2012  ? HTN (hypertension), benign 10/25/2012  ? ?PCP:  Tammi Sou, MD ?Pharmacy:   ?[facility] ?[place] [address] ?Phone: [phone] Fax: [phone] ? ? ? ? ?Social Determinants of Health (SDOH) Interventions ?  ? ?Readmission Risk Interventions ?No flowsheet data found. ? ? ?

## 2022-02-27 NOTE — ED Provider Notes (Signed)
Patient signed out to me by previous provider. Please refer to their note for full HPI.  Briefly this is a 76 year old female who presented as a code stroke for slurred speech.  Pending MRI and reevaluation by neurology.  On my initial evaluation speech is slightly slow but patient states back to normal.  No other acute neurologic complaints. ?Physical Exam  ?BP (!) 169/86   Pulse 67   Temp 98 ?F (36.7 ?C) (Oral)   Resp 17   Wt 62.4 kg   SpO2 100%   BMI 23.61 kg/m?  ? ?Physical Exam ?Vitals and nursing note reviewed.  ?Constitutional:   ?   Appearance: Normal appearance.  ?HENT:  ?   Head: Normocephalic.  ?   Comments: Old scattered facial ecchymosis ?   Mouth/Throat:  ?   Mouth: Mucous membranes are moist.  ?Cardiovascular:  ?   Rate and Rhythm: Normal rate.  ?Pulmonary:  ?   Effort: Pulmonary effort is normal. No respiratory distress.  ?Abdominal:  ?   Palpations: Abdomen is soft.  ?   Tenderness: There is no abdominal tenderness.  ?Skin: ?   General: Skin is warm.  ?Neurological:  ?   Mental Status: She is alert and oriented to person, place, and time. Mental status is at baseline.  ?Psychiatric:     ?   Mood and Affect: Mood normal.  ? ? ?Procedures  ?Procedures ? ?ED Course / MDM  ? ?Clinical Course as of 02/27/22 0026  ?Tue Feb 26, 2022  ?1457 Essentially no change in clinical exam at this time.  She remains alert and conversant.  She is confused. [EW]  ?  ?Clinical Course User Index ?[EW] Daleen Bo, MD  ? ?Medical Decision Making ?Amount and/or Complexity of Data Reviewed ?Labs: ordered. ?Radiology: ordered. ? ?Risk ?Prescription drug management. ?Decision regarding hospitalization. ? ? ?MRI is negative for new acute findings.  Spoke with Dr. Theda Sers, patient is cleared from a neurology standpoint.  When I went to reevaluate the patient she is now complaining of worsening mid back pain, worse since a traumatic fall in the past 3 days.  Not relieved with her home dose of oxycodone.  Patient has  midline spinal tenderness on exam.  CT of the thoracic and lumbar spine shows an acute compression fracture at T12 with other old chronic compression fractures.  Patient's pain is not fully controlled.  We will plan for admit for compression fraction, pain control and further evaluation.  She is neuro intact.  Patients evaluation and results requires admission for further treatment and care.  Spoke with hospitalist Dr. Roel Cluck, reviewed patient's ED course and they accept admission.  Patient agrees with admission plan, offers no new complaints and is stable/unchanged at time of admit. ? ? ? ? ?  ?Lorelle Gibbs, DO ?02/27/22 0028 ? ?

## 2022-02-27 NOTE — Care Management Obs Status (Signed)
MEDICARE OBSERVATION STATUS NOTIFICATION ? ? ?Patient Details  ?Name: Linda [name] ?MRN: [id number] ?Date of Birth: 11-27-1946 ? ? ?Medicare Observation Status Notification Given:  Yes ? ? ? ?Cyndi Bender, RN ?02/27/2022, 12:04 PM ?

## 2022-02-27 NOTE — Evaluation (Signed)
Occupational Therapy Evaluation Patient Details Name: Linda [name] MRN: [id number] DOB: 1946/10/01 Today's Date: 02/27/2022   History of Present Illness Pt is a 76 y.o. female who presented to the ED from her ILF with confusion and slurred speech. MRI negative. CT revealed T12 comp fx. Admitted for acute encephalopathy. PMH: diastolic CHF, nonobstructive CAD, fibromyalgia, osteoporosis, BPPV, chronic bilat knee pain due to OA, chronic low back pain, COPD, essential hypertension, OSA on CPAP, recurrent UTIs, small PE in 2014, briefly on Coumadin for 8-9 months with an acute GIB in 2015 while on Coumadin, and a recent fall on 2/25 with subsequent R maxillary sinus and R orbital floor fracture with mild depression.   Clinical Impression   PTA, pt lives at [facility] and reports typically Modified Independent with ADLs/mobility using Rollator. Pt endorses recent hx of falls. Pt presents now with minor deficits in pain (back), dynamic standing balance, strength and endurance. Pt overall Setup for UB ADLs, min guard for LB ADLs, and min guard for transfers using RW. Educated on back precautions to minimize pain with LB ADLs and good carryover noted. Pt without overt LOB, eager to return back to her apartment once deemed medically stable. Recommend HHOT follow-up to decrease fall risk during daily routine.   VSS on RA.       Recommendations for follow up therapy are one component of a multi-disciplinary discharge planning process, led by the attending physician.  Recommendations may be updated based on patient status, additional functional criteria and insurance authorization.   Follow Up Recommendations  Home health OT    Assistance Recommended at Discharge Intermittent Supervision/Assistance  Patient can return home with the following A little help with bathing/dressing/bathroom;Assistance with cooking/housework;Direct supervision/assist for medications management    Functional Status  Assessment  Patient has had a recent decline in their functional status and demonstrates the ability to make significant improvements in function in a reasonable and predictable amount of time.  Equipment Recommendations  None recommended by OT    Recommendations for Other Services       Precautions / Restrictions Precautions Precautions: Fall;Back Precaution Comments: frequent falls, back precautions for T12 comp fx/comfort Restrictions Weight Bearing Restrictions: No      Mobility Bed Mobility               General bed mobility comments: up in chair on entry    Transfers Overall transfer level: Needs assistance Equipment used: Rolling walker (2 wheels) Transfers: Sit to/from Stand Sit to Stand: Min guard           General transfer comment: cues for posture, able to push from armrests without need for cues      Balance Overall balance assessment: History of Falls, Needs assistance Sitting-balance support: No upper extremity supported, Feet supported Sitting balance-Leahy Scale: Good     Standing balance support: During functional activity, Bilateral upper extremity supported, Single extremity supported Standing balance-Leahy Scale: Fair Standing balance comment: RW for amb                           ADL either performed or assessed with clinical judgement   ADL Overall ADL's : Needs assistance/impaired Eating/Feeding: Independent;Sitting   Grooming: Supervision/safety;Standing   Upper Body Bathing: Set up;Sitting   Lower Body Bathing: Min guard;Sit to/from stand   Upper Body Dressing : Set up;Sitting   Lower Body Dressing: Min guard;Sit to/from stand;Sitting/lateral leans Lower Body Dressing Details (indicate cue type and  reason): Educated on back precautions for LB dressing with pt able to don socks easily with figure four position (though endorses pain) Toilet Transfer: Min guard;Ambulation;Rolling walker (2 wheels)   Toileting-  Clothing Manipulation and Hygiene: Min guard;Sit to/from stand;Sitting/lateral lean       Functional mobility during ADLs: Min guard;Rolling walker (2 wheels) General ADL Comments: Limited by back pain with hx of recent falls. Educated on fall prevention strategies (pt already implementing some with using no rinse soap wipes in shower rather than running water, etc). Discussed use of reacher to access items without bending to floor.     Vision Baseline Vision/History: 1 Wears glasses Ability to See in Adequate Light: 1 Impaired Patient Visual Report: No change from baseline Vision Assessment?: No apparent visual deficits     Perception     Praxis      Pertinent Vitals/Pain Pain Assessment Pain Assessment: Faces Faces Pain Scale: Hurts little more Pain Location: back Pain Descriptors / Indicators: Grimacing, Discomfort Pain Intervention(s): Monitored during session, Limited activity within patient's tolerance     Hand Dominance Right   Extremity/Trunk Assessment Upper Extremity Assessment Upper Extremity Assessment: Generalized weakness   Lower Extremity Assessment Lower Extremity Assessment: Defer to PT evaluation   Cervical / Trunk Assessment Cervical / Trunk Assessment: Other exceptions Cervical / Trunk Exceptions: T12 comp fx   Communication Communication Communication: No difficulties   Cognition Arousal/Alertness: Awake/alert Behavior During Therapy: WFL for tasks assessed/performed Overall Cognitive Status: Within Functional Limits for tasks assessed                                       General Comments  VSS on RA    Exercises     Shoulder Instructions      Home Living Family/patient expects to be discharged to:: Other (Comment) (Independent Living) Living Arrangements: Alone Available Help at Discharge: Family;Friend(s) Type of Home: Independent living facility Home Access: Level entry     Home Layout: One level     Bathroom  Shower/Tub: Occupational psychologist: Handicapped height     Home Equipment: Rollator (4 wheels);Cane - single point;Shower seat;Grab bars - tub/shower;Hand held shower head;Adaptive equipment Adaptive Equipment: Reacher        Prior Functioning/Environment Prior Level of Function : Independent/Modified Independent             Mobility Comments: uses rollator for ambulation. pt reported she walks to the dining room ADLs Comments: no assist for bathinig, dressing. she walks to dining room for meals. she has a cleaning service        OT Problem List: Decreased activity tolerance;Decreased strength;Impaired balance (sitting and/or standing);Decreased safety awareness;Decreased knowledge of use of DME or AE;Decreased knowledge of precautions      OT Treatment/Interventions: Self-care/ADL training;Therapeutic exercise;Energy conservation;DME and/or AE instruction;Therapeutic activities;Patient/family education;Balance training    OT Goals(Current goals can be found in the care plan section) Acute Rehab OT Goals Patient Stated Goal: go back to my apartment OT Goal Formulation: With patient Time For Goal Achievement: 03/13/22 Potential to Achieve Goals: Good  OT Frequency: Min 2X/week    Co-evaluation              AM-PAC OT "6 Clicks" Daily Activity     Outcome Measure Help from another person eating meals?: None Help from another person taking care of personal grooming?: A Little Help from another person toileting, which  includes using toliet, bedpan, or urinal?: A Little Help from another person bathing (including washing, rinsing, drying)?: A Little Help from another person to put on and taking off regular upper body clothing?: A Little Help from another person to put on and taking off regular lower body clothing?: A Little 6 Click Score: 19   End of Session Equipment Utilized During Treatment: Rolling walker (2 wheels)  Activity Tolerance: Patient tolerated  treatment well Patient left: in chair;with call bell/phone within reach;with chair alarm set  OT Visit Diagnosis: Unsteadiness on feet (R26.81);Other abnormalities of gait and mobility (R26.89);Muscle weakness (generalized) (M62.81);History of falling (Z91.81);Repeated falls (R29.6)                Time: 6962-9528 OT Time Calculation (min): 20 min Charges:  OT General Charges $OT Visit: 1 Visit OT Evaluation $OT Eval Low Complexity: 1 Low  Malachy Chamber, OTR/L Acute Rehab Services Office: [phone]   Layla Maw 02/27/2022, 9:46 AM

## 2022-02-27 NOTE — Evaluation (Signed)
Clinical/Bedside Swallow Evaluation Patient Details  Name: Linda [name] MRN: [id number] Date of Birth: [birth year]  Today's Date: 02/27/2022 Time: SLP Start Time (ACUTE ONLY): 0955 SLP Stop Time (ACUTE ONLY): 1020 SLP Time Calculation (min) (ACUTE ONLY): 25 min  Past Medical History:  Past Medical History:  Diagnosis Date   Acute upper GI bleed 06/2014   while pt taking coumadin, plavix, and meloxicam---despite being told not to take coumadin.   Anginal pain ([place])    Nonobstructive CAD 2014; however, her cardiologist put her on a statin for this and NOT for hyperlipidemia per pt report.  Atyp CP 08/2017 at card f/u, plan for myoc perf imaging.   Anxiety    panic attacks   Asthma    w/ asbestososis    BPPV (benign paroxysmal positional vertigo) 12/16/2012   Chronic diastolic CHF (congestive heart failure) (HCC)    [name] wt as of 11/06/16 is 168 lbs.   Chronic lower back pain    COPD (chronic obstructive pulmonary disease) (HCC)    DDD (degenerative disc disease)    lumbar and cervical.    Diverticular disease    Fibromyalgia    Patient states dx was around her late 57s but she had sx's for years prior to this.   H/O hiatal hernia    History of pneumonia    hospitalized 12/2011, 02/2013, and 07/2013 [facility]) for this   HTN (hypertension)    Renal artery dopplers 04/2013 neg for stenosis.   Hypervitaminosis D 09/27/2019   over-supplemented.  Stopped vit D and plan recheck 2 mo.   Idiopathic angio-edema-urticaria 72014; 2021   Angioedema component was very minimal.  2021->Dr. Bobbitt (allergist) eval.   Insomnia    Iron deficiency anemia    [place] in [place]; no cause found; failed oral supplement;; gets fairly regular (q71mor so) IV iron infusions (Venofer -iron sucrose- '200mg'$  with procrit.  "for 14 yr I've been getting blood work q month & getting infusions prn" (07/12/2013).  Dr. EMarin Olplocally, iron infusions done, EPO deficiency dx'd   Kidney stone     Migraine syndrome    "not as often anymore; used to be ~ q wk" (07/12/2013)   Mixed incontinence urge and stress    Nephrolithiasis    "passed all on my own or they are still in there" (07/12/2013)   Neuroleptic induced parkinsonism (HCC) 2018   Dr. TCarles Collet neuro, saw her 11/24/17 and recommended d/c of abilify as first step.  D/c'd abilify and pt got complete recovery.   Oropharyngeal dysphagia    swallowing study speech path 05/2020. Gastric bx's showed gastritis, h pylori NEG   OSA on CPAP    prior to move to [place]--had another sleep study 10/2015 w/pulm Dr. ECamillo Flaming   Osteoarthritis    "severe; progressing fast" (07/12/2013); multiple joints-not surgical candidate for TKR (03/2015).  Triamcinolon knee injections by Dr. STessa Lerner1/2019.   Pernicious anemia 08/24/2014   Pleural plaque with presence of asbestos 07/22/2013   [facility] 07[facility]2013   [facility]: pt took coumadin for about 8-9 mo   Pyelonephritis    "several times over the last 30 yr" (07/12/2013)   RBBB (right bundle branch block)    [place]urrent major depression ([place]    Recurrent UTI    hx of hospitalization for pyelonephritis; started abx prophylaxis 06/2015   Syncope    Hypotensive; ED visit--Dr. HTerrence Dupontdid Cath--nonobstructive CAD, EF 55-60%.  In  retrospect, suspect pt rx med misuse/polypharmacy   Past Surgical History:  Past Surgical History:  Procedure Laterality Date   APPENDECTOMY  1960   AXILLARY SURGERY Left 1978   Multiple "lump" in armpit per pt   BIOPSY  06/17/2020   Gastric bx->gastritis, h pylori neg.  Procedure: BIOPSY;  Surgeon: Carol Ada, MD;  Location: [facility];  Service: Endoscopy;;   CARDIAC CATHETERIZATION  01/2013   nonobstructive CAD, EF 55-60%   CARDIOVASCULAR STRESS TEST  02/22/15   Low risk myocard perf imaging; wall motion normal, normal EF   carotid duplex doppler  10/21/2017   R vertebral flow suggestive of possible distal obstruction.   Pt declined further w/u as of 10/29/17 but need to revisit this problem periodically.   COCCYX REMOVAL  1972   DEXA  06/05/2017   T-score -3.1   DILATION AND CURETTAGE OF UTERUS  ? 1970's   ESOPHAGOGASTRODUODENOSCOPY N/A 07/19/2014   Gastritis found + in the setting of supratherapeutic INR, +plavix, + meloxicam.   ESOPHAGOGASTRODUODENOSCOPY (EGD) WITH PROPOFOL N/A 06/17/2020   NO stricture or other prob to explain pt's dysphagia, dilation was done anyway.  Gastric bx's-->gastritis, h pylori neg. Procedure: ESOPHAGOGASTRODUODENOSCOPY (EGD) WITH PROPOFOL;  Surgeon: Carol Ada, MD;  Location: [facility];  Service: Endoscopy;  Laterality: N/A;   EYE SURGERY Left 2012-2013   "injections for ~ 1 yr; don't really know what for" (07/12/2013)   HEEL SPUR SURGERY Left 2008   KNEE SURGERY  2005   LEFT HEART CATHETERIZATION WITH CORONARY ANGIOGRAM N/A 01/30/2013   Procedure: LEFT HEART CATHETERIZATION WITH CORONARY ANGIOGRAM;  Surgeon: Clent Demark, MD;  Location: [facility];  Service: Cardiovascular;  Laterality: N/A;   MALONEY DILATION  06/17/2020   Procedure: Venia Minks DILATION;  Surgeon: Carol Ada, MD;  Location: [facility];  Service: Endoscopy;;   PLANTAR FASCIA RELEASE Left 2008   SPIROMETRY  04/25/14   In hosp for acute asthma/COPD flare: mixed obstructive and restrictive lung disease. The FEV1 is severely reduced at 45% predicted.  FEV1 signif decreased compared to prior spirometry 07/23/13.   TENDON RELEASE  1996   Right forearm and hand   TOTAL ABDOMINAL HYSTERECTOMY  1974   TRANSTHORACIC ECHOCARDIOGRAM  01/2013; 04/2014;08/2015; 09/2017   2014--NORMAL.  2015--focal basal septal hypertrophy, EF 55-60%, grade I diast dysfxn, mild LAE.  08/2015 EF 55-60%, nl LV syst fxn, grade I DD, valves wnl. 10/21/17: EF 65-70%, grd I DD, o/w normal.   HPI:  Pt is a 76 y.o. female who presented to the ED from her ILF with confusion and slurred speech. MRI negative. CT revealed T12 comp fx. Admitted for acute  encephalopathy. PMH: diastolic CHF, nonobstructive CAD, fibromyalgia, osteoporosis, BPPV, chronic bilat knee pain due to OA, chronic low back pain, COPD, essential hypertension, OSA on CPAP, recurrent UTIs, small PE in 2014, briefly on Coumadin for 8-9 months with an acute GIB in 2015 while on Coumadin, and a recent fall on 2/25 with subsequent R maxillary sinus and R orbital floor fracture with mild depression. Patient had MBS during admission in 2021 which revealed mild cervical esophageal dysphagia with appearance of decreased relaxation of cricopharyngeus muscle. Patient has since avoided most meats secondary to instances of inability to swallow and choking.    Assessment / Plan / Recommendation  Clinical Impression  Patient is not currently presenting with any new onset of dysphagia symptoms as per this clinical/bedside swallow evaluation. MRI brain and CXR were both was negative for acute abnormality. Patient had MBS  during previous admission in 2021 and finding was mild cervical esophageal dysphagia with appearance of decreased relaxation of cricopharyngeus muscle. Patient reported then and continues to report difficulties with swallowing some PO's, especially meats. She reported that knows what she can eat and what she cannot. As patient does not appear to be exhibiting any new dysphagia symptoms and she is cognitively intact to make safe decisions regarding her PO intake, SLP is not recommending further skilled SLP intervention or testing at this time. Of note, patient reported that quality of food has declined at her ILF and she has subsequently had some unintended weight loss. SLP Visit Diagnosis: Dysphagia, unspecified (R13.10)    Aspiration Risk  No limitations    Diet Recommendation Regular;Thin liquid   Liquid Administration via: Straw;Cup Medication Administration: Whole meds with liquid Supervision: Patient able to self feed Compensations: Small sips/bites;Follow solids with  liquid Postural Changes: Seated upright at 90 degrees;Remain upright for at least 30 minutes after po intake    Other  Recommendations Oral Care Recommendations: Oral care BID;Patient independent with oral care    Recommendations for follow up therapy are one component of a multi-disciplinary discharge planning process, led by the attending physician.  Recommendations may be updated based on patient status, additional functional criteria and insurance authorization.  Follow up Recommendations No SLP follow up      Assistance Recommended at Discharge None  Functional Status Assessment Patient has not had a recent decline in their functional status  Frequency and Duration     N/A       Prognosis   N/A     Swallow Study   General Date of Onset: 02/26/22 HPI: Pt is a 76 y.o. female who presented to the ED from her ILF with confusion and slurred speech. MRI negative. CT revealed T12 comp fx. Admitted for acute encephalopathy. PMH: diastolic CHF, nonobstructive CAD, fibromyalgia, osteoporosis, BPPV, chronic bilat knee pain due to OA, chronic low back pain, COPD, essential hypertension, OSA on CPAP, recurrent UTIs, small PE in 2014, briefly on Coumadin for 8-9 months with an acute GIB in 2015 while on Coumadin, and a recent fall on 2/25 with subsequent R maxillary sinus and R orbital floor fracture with mild depression. Patient had MBS during admission in 2021 which revealed mild cervical esophageal dysphagia with appearance of decreased relaxation of cricopharyngeus muscle. Patient has since avoided most meats secondary to instances of inability to swallow and choking. Type of Study: Bedside Swallow Evaluation Previous Swallow Assessment: MBS during previous admission 06/15/20 Diet Prior to this Study: Regular;Thin liquids Temperature Spikes Noted: No Respiratory Status: Room air History of Recent Intubation: No Behavior/Cognition: Alert;Cooperative;Pleasant mood Oral Cavity Assessment:  Within Functional Limits Oral Care Completed by SLP: No Oral Cavity - Dentition: Adequate natural dentition Vision: Functional for self-feeding Self-Feeding Abilities: Able to feed self Patient Positioning: Upright in chair Baseline Vocal Quality: Normal Volitional Cough: Strong Volitional Swallow: Able to elicit    Oral/Motor/Sensory Function Overall Oral Motor/Sensory Function: Within functional limits   Ice Chips     Thin Liquid Thin Liquid: Within functional limits Presentation: Straw;Self Fed    Nectar Thick     Honey Thick     Puree Puree: Not tested   Solid     Solid: Not tested      Sonia Baller, MA, CCC-SLP Speech Therapy

## 2022-02-27 NOTE — Progress Notes (Signed)
Initial Nutrition Assessment ? ?DOCUMENTATION CODES:  ? ?Not applicable ? ?INTERVENTION:  ? ?Multivitamin w/ minerals daily ?Ensure Enlive po BID, each supplement provides 350 kcal and 20 grams of protein. ?Liberalize pt diet to regular due to poor PO intake. ?Encourage good PO intake  ? ?NUTRITION DIAGNOSIS:  ? ?Increased nutrient needs related to chronic illness as evidenced by estimated needs. ? ?GOAL:  ? ?Patient will meet greater than or equal to 90% of their needs ? ?MONITOR:  ? ?PO intake, Supplement acceptance, Labs, Weight trends ? ?REASON FOR ASSESSMENT:  ? ?Malnutrition Screening Tool ?  ? ?ASSESSMENT:  ? ?76 y.o. female presented to the ED with a slurred speech. Pt with multiple recent falls. PMH includes HTN, CHF, COPD, and dysphagia. Pt admitted with acute encephalopathy, suspect 2/2 dehydration and poor PO intake.  ? ?Nurse techs enter room to provide pt care at time of RD visit.  ? ?Pt reports that she has not had an appetite for a while now. Reports that she has a few broken bones in her face as well that makes it hard to chew and that has impacted how much she can eat as well. Pt reports that she has boost at home that she can drink.  ?Pt provided RD with lunch order.  ? ?Pt unable to provide information regarding weight. Per EMR, pt has not had any weight loss within the past year.  ? ?Medications reviewed and include: Protonix, Potassium Chloride ?Labs reviewed: Potassium 3.0, BUN 6 ? ?NUTRITION - FOCUSED PHYSICAL EXAM: ? ?Unable to complete due to pt care.  ? ?Diet Order:   ?Diet Order   ? ?       ?  Diet Heart Room service appropriate? Yes; Fluid consistency: Thin  Diet effective now       ?  ? ?  ?  ? ?  ? ? ?EDUCATION NEEDS:  ? ?No education needs have been identified at this time ? ?Skin:  Skin Assessment: Reviewed RN Assessment ? ?Last BM:  3/5 ? ?Height:  ? ?Ht Readings from Last 1 Encounters:  ?02/27/22 '5\' 4"'$  (1.626 m)  ? ? ?Weight:  ? ?Wt Readings from Last 1 Encounters:  ?02/27/22 66.8  kg  ? ? ?Ideal Body Weight:  54.6 kg ? ?BMI:  Body mass index is 25.28 kg/m?. ? ?Estimated Nutritional Needs:  ? ?Kcal:  2000-2200 ? ?Protein:  100-115 grams ? ?Fluid:  >/= 2 L ? ? ? ?Hermina Barters RD, LDN ?Clinical Dietitian ?See AMiON for contact information.  ? ?

## 2022-02-27 NOTE — TOC Transition Note (Signed)
Transition of Care (TOC) - CM/SW Discharge Note ? ? ?Patient Details  ?Name: Linda [name] ?MRN: [id number] ?Date of Birth: 09-Nov-1946 ? ?Transition of Care (TOC) CM/SW Contact:  ?Cyndi Bender, RN ?Phone Number: ?02/27/2022, 3:48 PM ? ? ?Clinical Narrative:    ?Patient stable for discharge. ?Faxed home health orders and discharge summary to [place] [facility]. ?Brace has been ordered. ?No other TOC needs. ? ? ? ?Final next level of care: [place] ?Barriers to Discharge: Barriers Resolved ? ? ?Patient Goals and CMS Choice ?Patient states their goals for this hospitalization and ongoing recovery are:: return home ?  ?Choice offered to / list presented to : Patient ? ?Discharge Placement ?  ?          [facility] ?  ?  ?  ?  ? ?Discharge Plan and Services ? ?Home health ?  ?Discharge Planning Services: CM Consult ?           ?  ?  ?  ?  ?  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 ?Social Determinants of Health (SDOH) Interventions ?  ? ? ?Readmission Risk Interventions ?No flowsheet data found. ? ? ? ? ?

## 2022-02-27 NOTE — Evaluation (Signed)
Physical Therapy Evaluation ?Patient Details ?Name: Linda [name] ?MRN: [id number] ?DOB: 02-01-1946 ?Today's Date: 02/27/2022 ? ?History of Present Illness ? Pt is a 76 y.o. female who presented to the ED from her ILF with confusion and slurred speech. MRI negative. CT revealed T12 comp fx. Admitted for acute encephalopathy. PMH: diastolic CHF, nonobstructive CAD, fibromyalgia, osteoporosis, BPPV, chronic bilat knee pain due to OA, chronic low back pain, COPD, essential hypertension, OSA on CPAP, recurrent UTIs, small PE in 2014, briefly on Coumadin for 8-9 months with an acute GIB in 2015 while on Coumadin, and a recent fall on 2/25 with subsequent R maxillary sinus and R orbital floor fracture with mild depression. ?  ?Clinical Impression ? Pt admitted with above diagnosis. PTA pt resided at [facility]. She has recently started ambulating with a rollator due to multiple falls. She was active with HHPT at time of admission. Pt currently with functional limitations due to the deficits listed below (see PT Problem List). On eval, she required min assist bed mobility, min guard assist transfers, and min guard assist ambulation 150' with RW. SpO2 in 90s on RA. Pt will benefit from skilled PT to increase their independence and safety with mobility to allow discharge to the venue listed below.  Pt adamant regarding her desire to return to her apartment at discharge.  ?   ?   ? ?Recommendations for follow up therapy are one component of a multi-disciplinary discharge planning process, led by the attending physician.  Recommendations may be updated based on patient status, additional functional criteria and insurance authorization. ? ?Follow Up Recommendations Home health PT ? ?  ?Assistance Recommended at Discharge PRN  ?Patient can return home with the following ? Assist for transportation;Assistance with cooking/housework ? ?  ?Equipment Recommendations None recommended by PT  ?Recommendations for Other  Services ?    ?  ?Functional Status Assessment Patient has had a recent decline in their functional status and demonstrates the ability to make significant improvements in function in a reasonable and predictable amount of time.  ? ?  ?Precautions / Restrictions Precautions ?Precautions: Fall;Back ?Precaution Comments: frequent falls, back precautions for T12 comp fx/comfort  ? ?  ? ?Mobility ? Bed Mobility ?Overal bed mobility: Needs Assistance ?Bed Mobility: Rolling, Sidelying to Sit ?Rolling: Modified independent (Device/Increase time) ?Sidelying to sit: Min assist, HOB elevated ?  ?  ?  ?General bed mobility comments: +rail, increased time, assist to elevate trunk ?  ? ?Transfers ?Overall transfer level: Needs assistance ?Equipment used: Rolling walker (2 wheels) ?Transfers: Sit to/from Stand ?Sit to Stand: Min guard ?  ?  ?  ?  ?  ?General transfer comment: assist for safety ?  ? ?Ambulation/Gait ?Ambulation/Gait assistance: Min guard ?Gait Distance (Feet): 150 Feet ?Assistive device: Rolling walker (2 wheels) ?Gait Pattern/deviations: Step-through pattern, Decreased stride length ?Gait velocity: decreased ?Gait velocity interpretation: <1.31 ft/sec, indicative of household ambulator ?  ?General Gait Details: min guard for safety. SpO2 maintained in 90s on RA. ? ?Stairs ?  ?  ?  ?  ?  ? ?Wheelchair Mobility ?  ? ?Modified Rankin (Stroke Patients Only) ?  ? ?  ? ?Balance Overall balance assessment: History of Falls, Needs assistance ?Sitting-balance support: No upper extremity supported, Feet supported ?Sitting balance-Leahy Scale: Good ?  ?  ?Standing balance support: During functional activity, Bilateral upper extremity supported, Single extremity supported ?Standing balance-Leahy Scale: Fair ?Standing balance comment: RW for amb ?  ?  ?  ?  ?  ?  ?  ?  ?  ?  ?  ?   ? ? ? ?  Pertinent Vitals/Pain Pain Assessment ?Pain Assessment: 0-10 ?Pain Score: 7  ?Pain Location: back ?Pain Descriptors / Indicators:  Grimacing, Discomfort ?Pain Intervention(s): Repositioned, Monitored during session, Patient requesting pain meds-RN notified  ? ? ?Home Living Family/patient expects to be discharged to:: Private residence ?Living Arrangements: Alone ?Available Help at Discharge: Family;Friend(s) ?Type of Home: Independent living facility [facility]) ?Home Access: Level entry ?  ?  ?  ?Home Layout: One level ?Home Equipment: Rollator (4 wheels);Cane - single point;Shower seat ?   ?  ?Prior Function Prior Level of Function : Independent/Modified Independent ?  ?  ?  ?  ?  ?  ?Mobility Comments: uses rollator for ambulation. pt reported she walks to the dining room ?  ?  ? ? ?Hand Dominance  ? Dominant Hand: Right ? ?  ?Extremity/Trunk Assessment  ? Upper Extremity Assessment ?Upper Extremity Assessment: Defer to OT evaluation ?  ? ?Lower Extremity Assessment ?Lower Extremity Assessment: Generalized weakness ?  ? ?Cervical / Trunk Assessment ?Cervical / Trunk Assessment: Other exceptions ?Cervical / Trunk Exceptions: T12 comp fx  ?Communication  ? Communication: No difficulties  ?Cognition Arousal/Alertness: Awake/alert ?Behavior During Therapy: [facility] for tasks assessed/performed ?Overall Cognitive Status: Within Functional Limits for tasks assessed ?  ?  ?  ?  ?  ?  ?  ?  ?  ?  ?  ?  ?  ?  ?  ?  ?  ?  ?  ? ?  ?General Comments   ? ?  ?Exercises    ? ?Assessment/Plan  ?  ?PT Assessment Patient needs continued PT services  ?PT Problem List Decreased strength;Decreased mobility;Decreased knowledge of precautions;Decreased activity tolerance;Pain;Decreased balance ? ?   ?  ?PT Treatment Interventions DME instruction;Gait training;Therapeutic exercise;Balance training;Functional mobility training;Therapeutic activities;Patient/family education   ? ?PT Goals (Current goals can be found in the Care Plan section)  ?Acute Rehab PT Goals ?Patient Stated Goal: stop falling ?PT Goal Formulation: With patient ?Time For Goal Achievement:  03/13/22 ?Potential to Achieve Goals: Good ? ?  ?Frequency Min 3X/week ?  ? ? ?Co-evaluation   ?  ?  ?  ?  ? ? ?  ?AM-PAC PT "6 Clicks" Mobility  ?Outcome Measure Help needed turning from your back to your side while in a flat bed without using bedrails?: None ?Help needed moving from lying on your back to sitting on the side of a flat bed without using bedrails?: A Little ?Help needed moving to and from a bed to a chair (including a wheelchair)?: A Little ?Help needed standing up from a chair using your arms (e.g., wheelchair or bedside chair)?: A Little ?Help needed to walk in hospital room?: A Little ?Help needed climbing 3-5 steps with a railing? : A Lot ?6 Click Score: 18 ? ?  ?End of Session Equipment Utilized During Treatment: Gait belt ?Activity Tolerance: Patient tolerated treatment well ?Patient left: in chair;with call bell/phone within reach;with chair alarm set ?Nurse Communication: Mobility status;Patient requests pain meds ?PT Visit Diagnosis: Repeated falls (R29.6);Muscle weakness (generalized) (M62.81);Pain ?  ? ?Time: 8889-1694 ?PT Time Calculation (min) (ACUTE ONLY): 33 min ? ? ?Charges:   PT Evaluation ?$PT Eval Moderate Complexity: 1 Mod ?PT Treatments ?$Gait Training: 8-22 mins ?  ?   ? ? ?Lorrin Goodell, PT  ?Office # [phone] ?Pager [phone] ? ? ?Lorriane Shire ?02/27/2022, 9:21 AM ? ?

## 2022-02-27 NOTE — Progress Notes (Signed)
NEUROLOGY CONSULTATION PROGRESS NOTE   Date of service: February 27, 2022 Patient Name: Linda [name] MRN:  [id number] DOB:  01/30/1946  Brief HPI  Linda [name] is a 76 year old female with a past medical history of HFpEF (NYHA class I), COPD, PE in 2014, subsequently placed on Coumadin with resulting GI bleed, recent fall on 2/25 with subsequent right maxillary sinus and right orbital floor fracture .  She presented to the emergency department for aphasia, dysarthria, and reported altered mental status.  She was noted on admission assessment to be slightly dysarthric with some attention problems, but otherwise was fully alert and oriented and had no aphasia.   Interval Hx   On interview and assessment this morning, the patient is fully alert and oriented with no attention deficit.  Her language is fluent and she is able to solve simple math problems.  She reports a 5-week history of falls, the most recent of which resulted in the fracture described above.  These falls do not occur after standing but as a result of "losing my balance" or "my knees give way".   Extensive review of systems is negative except for chronic mild dyspnea and for incontinence which has occurred for several years.  The patient denies sexual activity for the past 7 years.  Vitals   Vitals:   02/27/22 0600 02/27/22 0650 02/27/22 0742 02/27/22 0859  BP:  132/63 (!) 156/67 (!) 160/63  Pulse: (!) 54 (!) 57 60 62  Resp: '11 18 12   '$ Temp:  97.8 F (36.6 C) (!) 97.4 F (36.3 C)   TempSrc:  Oral Oral   SpO2: 91% 93% 95%   Weight:      Height:         Body mass index is 25.28 kg/m.  Physical Exam   General: sitting comfortably in chair; in no acute distress.  HENT: Dry oropharynx and mucosa. Normal external appearance of ears and nose.  Neck: Supple, no pain or tenderness  CV: No peripheral edema.  Pulmonary: Symmetric Chest rise. Normal respiratory effort.  Ext: No cyanosis, edema, or deformity  Skin: No  rash. Normal palpation of skin.   Neurologic Examination  Mental status/Cognition: Alert, oriented to self, place, month and year, good attention.  Speech/language: Fluent, comprehension intact, object naming intact, repetition intact.  Cranial nerves:   CN II Pupils equal and reactive to light, no VF deficits    CN III,IV,VI EOM intact, no gaze preference or deviation, no nystagmus    CN V normal sensation in V1, V2, and V3 segments bilaterally    CN VII no asymmetry, no nasolabial fold flattening    CN VIII normal hearing to speech    CN IX & X normal palatal elevation, no uvular deviation    CN XI 4/5 shoulder shrug bilaterally    CN XII midline tongue protrusion    Motor:   Mvmt Root Nerve  Muscle Right Left Comments  SA C5/6 Ax Deltoid 4/5 4/5   EF C5/6 Mc Biceps 4/5 4/5   EE C6/7/8 Rad Triceps 4/5 4/5   WF C6/7 Med FCR     WE C7/8 PIN ECU     F Ab C8/T1 U ADM/FDI     HF L1/2/3 Fem Illopsoas 4/5 4/5   KE L2/3/4 Fem Quad 4/5 4/5   DF L4/5 D Peron Tib Ant     PF S1/2 Tibial Grc/Sol      Reflexes:  Right Left Comments  Pectoralis  Biceps (C5/6) 2+ 2+   Brachioradialis (C5/6) 2+ 2+    Triceps (C6/7) 2+ 2+    Patellar (L3/4) 2+ 2+    Achilles (S1) 2+ 2+    Hoffman      Plantar     Jaw jerk    Sensation:  Light touch intact   Pin prick    Temperature    Vibration   Proprioception    Coordination/Complex Motor:  - Finger to Nose intact - Heel to shin intact - Rapid alternating movement intact - Gait: deferred  Labs   Basic Metabolic Panel:  Lab Results  Component Value Date   NA 141 02/27/2022   K 3.0 (L) 02/27/2022   CO2 27 02/27/2022   GLUCOSE 82 02/27/2022   BUN 6 (L) 02/27/2022   CREATININE 0.70 02/27/2022   CALCIUM 9.4 02/27/2022   GFRNONAA >60 02/27/2022   GFRAA >60 06/18/2020   HbA1c:  Lab Results  Component Value Date   HGBA1C 5.6 01/02/2016   LDL:  Lab Results  Component Value Date   LDLCALC 61 10/11/2021   Urine Drug Screen:      Component Value Date/Time   LABOPIA POSITIVE (A) 11/24/2020 2115   COCAINSCRNUR NONE DETECTED 11/24/2020 2115   COCAINSCRNUR NEG 10/06/2015 1433   LABBENZ POSITIVE (A) 11/24/2020 2115   AMPHETMU NONE DETECTED 11/24/2020 2115   THCU NONE DETECTED 11/24/2020 2115   LABBARB NONE DETECTED 11/24/2020 2115    Alcohol Level     Component Value Date/Time   [facility] <11 01/30/2013 0507   Lab Results  Component Value Date   PHENYTOIN <2.5 (L) 01/30/2013   Lab Results  Component Value Date   PHENYTOIN <2.5 (L) 01/30/2013    Imaging and Diagnostic studies   CTH IMPRESSION: No acute intracranial abnormality.  MRI-Brain IMPRESSION: No acute intracranial finding. Mild chronic small-vessel change of the hemispheric white matter. Old lacunar infarction of the right caudate head. Right maxillary rhinosinusitis.  CTA Head and Neck IMPRESSION: 1. No emergent large vessel occlusion. 2. CT perfusion demonstrates no significant core infarct or significant ischemia. 3. Atherosclerotic changes at the carotid bifurcations bilaterally, within the cavernous internal carotid arteries, and at the dural margin of both vertebral arteries. 4. Moderate stenosis in the hypoplastic right V3 segment and distal right V4 segment. The distal right V4 segment does fill. 5. Moderate stenosis of the right P2 segment. 6. Degenerative changes in the cervical spine as described. 7. 11 mm nodule in the inferior right lobe of the thyroid. Not clinically significant; no follow-up imaging recommended (ref: J Am Coll Radiol. 2015 Feb;12(2): 143-50). 8. Aortic Atherosclerosis (ICD10-I70.0).  CT Thoracic Spine IMPRESSION: 1. Acute compression fracture involving the superior endplate of S92 with mild 25% height loss and 3 mm bony retropulsion. No significant stenosis. 2. Additional mild chronic compression deformities involving the T1, T3, T5, T7, and T9 vertebral bodies. 3. No other acute traumatic injury  within the thoracic spine. 4. Multiple partially calcified pleural plaques about the partially visualized lungs, suggesting prior asbestos exposure. Finding could be further assessed with dedicated cross-sectional of the chest as clinically warranted. 5. Underlying mild pulmonary interstitial congestion.   Impression   Linda [name] is a 76 year old female with a past medical history of HFpEF (NYHA class I), COPD, PE in 2014, subsequently placed on Coumadin with resulting GI bleed, recent fall on 2/25 with subsequent right maxillary sinus and right orbital floor fracture .  She presented to the emergency department for aphasia, dysarthria, and reported  altered mental status.  She was noted on admission assessment to be slightly dysarthric with some attention problems, but otherwise was fully alert and oriented and had no aphasia.  Currently, patient appears to be at baseline mental functioning with intact orientation and attention, confirmed with collateral contact with patient's son who spoke with the patient this morning.  Structural and infectious causes of altered mental status seem unlikely at this time, given the patient's laboratory findings and imaging.  The patient's home medication regimen includes a large amount of Xanax, robaxin as well as hydrocodone, both of which can be deliriogenic in elderly patients.  The patient denies taking more of these medications than usual.  However, I suspect they are a primary contributing factor to her episode of altered mental status.  The Xanax and hydrocodone are likely also contributing to her numerous recent falls.   Also has compression fracture T spine -nsgy on board.  Recommendations   -Check B12 and A1C - replace b12 if lower than 400.  -Reduce sedating meds as much as possible. -Please call with questions as needed.  Corky Sox, MD PGY-1   Attending Neurohospitalist Addendum Patient seen and examined with APP/Resident. Agree  with the history and physical as documented above. Agree with the plan as documented, which I helped formulate. I have independently reviewed the chart, obtained history, review of systems and examined the patient.I have personally reviewed pertinent head/neck/spine imaging (CT/MRI). Please feel free to call with any questions.  -- Amie Portland, MD Neurologist [facility] Pager: [phone]

## 2022-02-27 NOTE — Progress Notes (Signed)
Orthopedic Tech Progress Note ?Patient Details:  ?Linda [name] ?1946-09-29 ?[id number] ? ?Ortho Devices ?Type of Ortho Device: Thoracolumbar corset (TLSO) ?Ortho Device/Splint Interventions: Ordered, Application, Adjustment ?  ?Post Interventions ?Patient Tolerated: Well ?Instructions Provided: Adjustment of device ? ?Linda [name] ?02/27/2022, 3:45 PM ?Delivered and applied brace. Removed as its for out of bed only. ?

## 2022-02-27 NOTE — Discharge Summary (Addendum)
PatientPhysician Discharge Summary  Linda [name] EVO:[id number] DOB: [birth year] DOA: 02/26/2022  PCP: Tammi Sou, MD  Admit date: 02/26/2022 Discharge date: 02/27/2022    Admitted From: Home Disposition: Home  Recommendations for Outpatient Follow-up:  Follow up with PCP in 1-2 weeks Please obtain BMP/CBC in one week Please call Dr. Jenkins/neurosurgery office as soon as possible to see him within 2 weeks Please follow up with your PCP on the following pending results: Unresulted Labs (From admission, onward)    None         Home Health: Yes Equipment/Devices: None  Discharge Condition: Stable CODE STATUS: Full code Diet recommendation: Cardiac  Subjective: Seen and examined.  Patient is fully alert and oriented.  She still complains of upper back pain but that is not new for her.  She is willing to be discharged today.  Brief/Interim Summary: Linda [name] is a 76 y.o. female with medical history significant of  diastolic CHF, nonobstructive CAD, COPD, essential hypertension, OSA on CPAP, recurrent UTIs, small PE in 2014 briefly on Coumadin for 8-9 months with an acute GIB in 2015 while on Coumadin, and a recent fall on 2/25 with subsequent right maxillary sinus and right orbital floor fracture with mild depression presented to the hospital with confusion.  She lives in independent living facility.  She was found to have some slurred speech as well.  Code stroke was activated, she was brought to the [facility], she underwent CT head, CT angiogram of head and neck followed by MRI brain and all of those were negative for any acute stroke or large vessel occlusion.  By the time she was admitted by hospitalist, she was almost back to baseline.  She was also seen by neurology.  She was also diagnosed with acute compression fracture of T12 with mild 25% height loss and additionally mild chronic compression deformities involving T1, T3, T5, T7 and T9 vertebral bodies.  When  seen this morning, patient is fully alert and oriented.  She tells me that she had fallen at least 3 times in the last 1 week.  She has mental back pain which has been going on for some time as well.  For this reason, she is on opioids.  Source/etiology of her acute encephalopathy seems to be polypharmacy as she is also on Xanax, Cymbalta, Lamictal and trazodone.  She has been assessed by PT OT and per them, she did very well and they have recommended home health PT OT which she was already getting.  I had discussed her case with Dr. Arnoldo Morale who reviewed the images and recommended arranging TLSO brace for her and discharging the patient with instructions to follow-up with him in 2 weeks where he will repeat her imagings.  Patient also feels comfortable going back to assisted living facility.  I have asked TOC to find out if she can have TLSO brace, if so, she will be discharged today.  She has been advised to remain cautious using too many sedative medications and opioids.  Her B12 is normal as well.  Her potassium is low.  She will receive replacement before discharge  Discharge plan was discussed with patient and/or family member and they verbalized understanding and agreed with it.  Discharge Diagnoses:  Principal Problem:   Acute encephalopathy Active Problems:   HTN (hypertension), benign   OSA (obstructive sleep apnea)   Chronic diastolic CHF, NYHA class 1   History of pulmonary embolism   COPD (chronic obstructive pulmonary disease) (HCC)  Fall   Thoracic vertebral fracture [facility])    Discharge Instructions   Allergies as of 02/27/2022       Reactions   Abilify [aripiprazole] Other (See Comments)   parkinsonism   Bactrim [sulfamethoxazole-trimethoprim] Rash   Penicillins Itching, Swelling, Rash   Tolerated Cefepime in ED. Has patient had a PCN reaction causing immediate rash, facial/tongue/throat swelling, SOB or lightheadedness with hypotension: Yes Has patient had a PCN reaction  causing severe rash involving mucus membranes or skin necrosis: No Has patient had a PCN reaction that required hospitalization: No  Has patient had a PCN reaction occurring within the last 10 years: No        Medication List     TAKE these medications    albuterol 108 (90 Base) MCG/ACT inhaler Commonly known as: VENTOLIN HFA Inhale 1-2 puffs into the lungs every 6 (six) hours as needed for wheezing or shortness of breath.   ALPRAZolam 1 MG tablet Commonly known as: XANAX TAKE 1 TABLET THREE TIMES DAILY AS NEEDED FOR ANXIETY. What changed:  how much to take how to take this when to take this reasons to take this additional instructions   aspirin 81 MG tablet Take 81 mg by mouth at bedtime.   diclofenac Sodium 1 % Gel Commonly known as: VOLTAREN Apply 2 g topically 4 (four) times daily. 4 times a day to both knees What changed:  when to take this reasons to take this additional instructions   donepezil 10 MG tablet Commonly known as: ARICEPT Take 1 tablet (10 mg total) by mouth at bedtime. must have office visit   DULoxetine 60 MG capsule Commonly known as: CYMBALTA TAKE 1 CAPSULE A DAY TO BE COMBINED WITH '30MG'$  CAPSULE What changed: See the new instructions.   DULoxetine 30 MG capsule Commonly known as: CYMBALTA TAKE 1 CAPSULE ONCE DAILY ALONG WITH '60MG'$  CAPSULE FOR TOTAL '90MG'$  DAILY. What changed: See the new instructions.   EPINEPHrine 0.3 mg/0.3 mL Soaj injection Commonly known as: EPI-PEN Inject 0.3 mg into the muscle as needed for anaphylaxis.   fluconazole 100 MG tablet Commonly known as: DIFLUCAN TAKE (1) TABLET DAILY AS NEEDED FOR THRUSH What changed: See the new instructions.   furosemide 40 MG tablet Commonly known as: LASIX Take 1 tablet (40 mg total) by mouth daily as needed for fluid.   ipratropium 0.03 % nasal spray Commonly known as: ATROVENT Place 2 sprays into both nostrils every 12 (twelve) hours as needed (allergies).   isosorbide  mononitrate 30 MG 24 hr tablet Commonly known as: IMDUR Take 2 tablets (60 mg total) by mouth daily. TAKE 2 TABLETS IN THE AM AND 1 TABLET IN THE PM. What changed:  how much to take when to take this additional instructions   lamoTRIgine 150 MG tablet Commonly known as: LAMICTAL TAKE 1 TABLET EACH DAY. What changed: See the new instructions.   methocarbamol 500 MG tablet Commonly known as: ROBAXIN Take 1 tablet (500 mg total) by mouth every 6 (six) hours as needed for muscle spasms.   metoprolol succinate 100 MG 24 hr tablet Commonly known as: TOPROL-XL take ONE tab daily, take additional 1/2 tab as needed FOR FOR BLOOD PRESSURE > 160/90 What changed:  how much to take how to take this when to take this additional instructions   multivitamin with minerals Tabs tablet Take 1 tablet by mouth daily.   nitroGLYCERIN 0.4 MG SL tablet Commonly known as: NITROSTAT Place 1 tablet (0.4 mg total) under the tongue every  5 (five) minutes as needed for chest pain (x 3 doses).   oxybutynin 5 MG tablet Commonly known as: DITROPAN Take 1 tablet (5 mg total) by mouth 3 (three) times daily.   oxyCODONE-acetaminophen 10-325 MG tablet Commonly known as: PERCOCET Take 1 tablet by mouth See admin instructions. 1 tablet oral twice daily, may take an additional 2 tablets through out the day as needed for pain   pantoprazole 40 MG tablet Commonly known as: PROTONIX TAKE 1 TABLET BY MOUTH TWICE DAILY.   polyvinyl alcohol 1.4 % ophthalmic solution Commonly known as: LIQUIFILM TEARS Place 1 drop into both eyes as needed for dry eyes.   rosuvastatin 20 MG tablet Commonly known as: CRESTOR Take 1 tablet (20 mg total) by mouth every evening.   traZODone 50 MG tablet Commonly known as: DESYREL TAKE 2-4 TABLETS AT BEDTIME What changed: See the new instructions.        Follow-up Information     Newman Pies, MD. Schedule an appointment as soon as possible for a visit in 2 week(s).    Specialty: Neurosurgery Contact information: [address] [place] [address] [phone]         Tammi Sou, MD Follow up in 1 week(s).   Specialty: Family Medicine Contact information: [address] [place] [address] [phone]                Allergies  Allergen Reactions   Abilify [Aripiprazole] Other (See Comments)    parkinsonism   Bactrim [Sulfamethoxazole-Trimethoprim] Rash   Penicillins Itching, Swelling and Rash    Tolerated Cefepime in ED. Has patient had a PCN reaction causing immediate rash, facial/tongue/throat swelling, SOB or lightheadedness with hypotension: Yes Has patient had a PCN reaction causing severe rash involving mucus membranes or skin necrosis: No Has patient had a PCN reaction that required hospitalization: No  Has patient had a PCN reaction occurring within the last 10 years: No     Consultations: Neurosurgery Dr. Arnoldo Morale over the phone.   Procedures/Studies: DG Chest 2 View  Result Date: 02/27/2022 CLINICAL DATA:  Encephalopathy EXAM: CHEST - 2 VIEW COMPARISON:  02/16/2022 FINDINGS: Cardiac shadow is within normal limits. Aortic calcifications are seen. Multiple calcified pleural plaques are noted bilaterally. No focal infiltrate or effusion is seen. No acute bony abnormality is noted. Chronic compression deformity is noted in the lower thoracic spine IMPRESSION: Stable calcified pleural plaques.  No acute abnormality noted. Electronically Signed   By: Inez Catalina M.D.   On: 02/27/2022 00:18   DG Chest 2 View  Result Date: 02/16/2022 CLINICAL DATA:  Shortness of breath EXAM: CHEST - 2 VIEW COMPARISON:  11/23/2020 FINDINGS: Bilateral calcified pleural plaques again noted, unchanged. Heart is normal size. No acute confluent airspace opacities or effusions. No acute bony abnormality. Aortic atherosclerosis. IMPRESSION: No acute cardiopulmonary disease. Calcified pleural plaques. Electronically  Signed   By: Rolm Baptise M.D.   On: 02/16/2022 02:48   CT HEAD WO CONTRAST  Result Date: 02/15/2022 CLINICAL DATA:  Head trauma, minor (Age >= 65y).  Multiple falls. EXAM: CT HEAD WITHOUT CONTRAST TECHNIQUE: Contiguous axial images were obtained from the base of the skull through the vertex without intravenous contrast. RADIATION DOSE REDUCTION: This exam was performed according to the departmental dose-optimization program which includes automated exposure control, adjustment of the mA and/or kV according to patient size and/or use of iterative reconstruction technique. COMPARISON:  None. FINDINGS: Brain: Mild age related volume loss. No acute  intracranial abnormality. Specifically, no hemorrhage, hydrocephalus, mass lesion, acute infarction, or significant intracranial injury. Vascular: No hyperdense vessel or unexpected calcification. Skull: No acute calvarial abnormality. Sinuses/Orbits: Near complete opacification of the right maxillary sinus and scattered right ethmoid air cells. Other: None IMPRESSION: No acute intracranial abnormality. Fluid/near complete opacification of the right maxillary sinus and scattered right ethmoid air cells. Electronically Signed   By: Rolm Baptise M.D.   On: 02/15/2022 23:11   CT Cervical Spine Wo Contrast  Result Date: 02/15/2022 CLINICAL DATA:  Multiple falls.  Neck trauma (Age >= 65y) EXAM: CT CERVICAL SPINE WITHOUT CONTRAST TECHNIQUE: Multidetector CT imaging of the cervical spine was performed without intravenous contrast. Multiplanar CT image reconstructions were also generated. RADIATION DOSE REDUCTION: This exam was performed according to the departmental dose-optimization program which includes automated exposure control, adjustment of the mA and/or kV according to patient size and/or use of iterative reconstruction technique. COMPARISON:  07/14/2021 FINDINGS: Alignment: No subluxation Skull base and vertebrae: No acute fracture. No primary bone lesion or focal  pathologic process. Soft tissues and spinal canal: No prevertebral fluid or swelling. No visible canal hematoma. Disc levels: Diffuse moderate to advanced degenerative disc disease and facet disease. Upper chest: No acute findings Other: None IMPRESSION: Degenerative disc and facet disease. No acute bony abnormality. Electronically Signed   By: Rolm Baptise M.D.   On: 02/15/2022 23:12   CT Thoracic Spine Wo Contrast  Result Date: 02/26/2022 CLINICAL DATA:  Initial evaluation for acute compression fracture, back pain. EXAM: CT THORACIC AND LUMBAR SPINE WITHOUT CONTRAST TECHNIQUE: Multidetector CT imaging of the thoracic and lumbar spine was performed without contrast. Multiplanar CT image reconstructions were also generated. RADIATION DOSE REDUCTION: This exam was performed according to the departmental dose-optimization program which includes automated exposure control, adjustment of the mA and/or kV according to patient size and/or use of iterative reconstruction technique. COMPARISON:  Prior chest CT from 09/15/2014. FINDINGS: CT THORACIC SPINE FINDINGS Alignment: Mild exaggeration of the normal thoracic kyphosis. No listhesis. Vertebrae: Acute compression fracture involving the superior endplate of Q65 with mild 25% height loss and 3 mm bony retropulsion. This is benign/mechanical in appearance. No significant stenosis. No other acute fracture seen within the thoracic spine. Additional mild multilevel chronic compression deformities involving the T1, T3, T5, T7, and T9 vertebral bodies noted. No discrete or worrisome osseous lesions. Visualized ribs intact. Paraspinal and other soft tissues: Mild paraspinous edema adjacent to the acute T12 fracture. Paraspinous soft tissues demonstrate no other acute finding. Multiple partially calcified pleural plaques noted about the partially visualized lungs, suggesting prior asbestos exposure. Underlying mild pulmonary interstitial congestion noted. Aortic  atherosclerosis. Disc levels: No significant disc pathology seen within the thoracic spine for age. No significant spinal stenosis. Foramina appear patent. CT LUMBAR SPINE FINDINGS Segmentation: Standard. Lowest well-formed disc space labeled the L5-S1 level. Alignment: 5 mm facet mediated anterolisthesis of L5 on S1. Alignment otherwise normal with preservation of the normal lumbar lordosis. Vertebrae: Vertebral body height maintained without acute or chronic fracture. Visualized sacrum and pelvis intact. SI joints symmetric and within normal limits. No discrete or worrisome osseous lesions. Paraspinal and other soft tissues: Paraspinous soft tissues demonstrate no acute finding. Secreted IV contrast material present within the renal collecting systems and bladder. Aortic atherosclerosis noted. Disc levels: L1-2:  Unremarkable. L2-3: Mild circumferential disc bulge. No significant spinal stenosis. Foramina remain patent. L3-4: Moderate degenerative intervertebral disc space narrowing with diffuse disc bulge and disc desiccation. Mild facet hypertrophy. No significant spinal  stenosis. Foramina remain patent. L4-5: Mild-to-moderate intervertebral disc space narrowing with diffuse disc bulge and disc desiccation. Mild to moderate facet hypertrophy. No significant spinal stenosis. Foramina remain patent. L5-S1: 5 mm anterolisthesis. Associated mild diffuse disc bulge with moderately advanced facet hypertrophy. No spinal stenosis. Foramina remain patent. IMPRESSION: CT THORACIC SPINE IMPRESSION: 1. Acute compression fracture involving the superior endplate of W09 with mild 25% height loss and 3 mm bony retropulsion. No significant stenosis. 2. Additional mild chronic compression deformities involving the T1, T3, T5, T7, and T9 vertebral bodies. 3. No other acute traumatic injury within the thoracic spine. 4. Multiple partially calcified pleural plaques about the partially visualized lungs, suggesting prior asbestos  exposure. Finding could be further assessed with dedicated cross-sectional of the chest as clinically warranted. 5. Underlying mild pulmonary interstitial congestion. CT LUMBAR SPINE IMPRESSION: 1. No acute traumatic injury within the lumbar spine. 2. Mild to moderate multilevel degenerative spondylolysis and facet arthrosis within the lumbar spine, most notable at L5-S1, where there is 5 mm facet mediated anterolisthesis. No significant stenosis or neural impingement. Aortic Atherosclerosis (ICD10-I70.0). Electronically Signed   By: Jeannine Boga M.D.   On: 02/26/2022 21:52   CT Lumbar Spine Wo Contrast  Result Date: 02/26/2022 CLINICAL DATA:  Initial evaluation for acute compression fracture, back pain. EXAM: CT THORACIC AND LUMBAR SPINE WITHOUT CONTRAST TECHNIQUE: Multidetector CT imaging of the thoracic and lumbar spine was performed without contrast. Multiplanar CT image reconstructions were also generated. RADIATION DOSE REDUCTION: This exam was performed according to the departmental dose-optimization program which includes automated exposure control, adjustment of the mA and/or kV according to patient size and/or use of iterative reconstruction technique. COMPARISON:  Prior chest CT from 09/15/2014. FINDINGS: CT THORACIC SPINE FINDINGS Alignment: Mild exaggeration of the normal thoracic kyphosis. No listhesis. Vertebrae: Acute compression fracture involving the superior endplate of W11 with mild 25% height loss and 3 mm bony retropulsion. This is benign/mechanical in appearance. No significant stenosis. No other acute fracture seen within the thoracic spine. Additional mild multilevel chronic compression deformities involving the T1, T3, T5, T7, and T9 vertebral bodies noted. No discrete or worrisome osseous lesions. Visualized ribs intact. Paraspinal and other soft tissues: Mild paraspinous edema adjacent to the acute T12 fracture. Paraspinous soft tissues demonstrate no other acute finding.  Multiple partially calcified pleural plaques noted about the partially visualized lungs, suggesting prior asbestos exposure. Underlying mild pulmonary interstitial congestion noted. Aortic atherosclerosis. Disc levels: No significant disc pathology seen within the thoracic spine for age. No significant spinal stenosis. Foramina appear patent. CT LUMBAR SPINE FINDINGS Segmentation: Standard. Lowest well-formed disc space labeled the L5-S1 level. Alignment: 5 mm facet mediated anterolisthesis of L5 on S1. Alignment otherwise normal with preservation of the normal lumbar lordosis. Vertebrae: Vertebral body height maintained without acute or chronic fracture. Visualized sacrum and pelvis intact. SI joints symmetric and within normal limits. No discrete or worrisome osseous lesions. Paraspinal and other soft tissues: Paraspinous soft tissues demonstrate no acute finding. Secreted IV contrast material present within the renal collecting systems and bladder. Aortic atherosclerosis noted. Disc levels: L1-2:  Unremarkable. L2-3: Mild circumferential disc bulge. No significant spinal stenosis. Foramina remain patent. L3-4: Moderate degenerative intervertebral disc space narrowing with diffuse disc bulge and disc desiccation. Mild facet hypertrophy. No significant spinal stenosis. Foramina remain patent. L4-5: Mild-to-moderate intervertebral disc space narrowing with diffuse disc bulge and disc desiccation. Mild to moderate facet hypertrophy. No significant spinal stenosis. Foramina remain patent. L5-S1: 5 mm anterolisthesis. Associated mild diffuse disc  bulge with moderately advanced facet hypertrophy. No spinal stenosis. Foramina remain patent. IMPRESSION: CT THORACIC SPINE IMPRESSION: 1. Acute compression fracture involving the superior endplate of M76 with mild 25% height loss and 3 mm bony retropulsion. No significant stenosis. 2. Additional mild chronic compression deformities involving the T1, T3, T5, T7, and T9  vertebral bodies. 3. No other acute traumatic injury within the thoracic spine. 4. Multiple partially calcified pleural plaques about the partially visualized lungs, suggesting prior asbestos exposure. Finding could be further assessed with dedicated cross-sectional of the chest as clinically warranted. 5. Underlying mild pulmonary interstitial congestion. CT LUMBAR SPINE IMPRESSION: 1. No acute traumatic injury within the lumbar spine. 2. Mild to moderate multilevel degenerative spondylolysis and facet arthrosis within the lumbar spine, most notable at L5-S1, where there is 5 mm facet mediated anterolisthesis. No significant stenosis or neural impingement. Aortic Atherosclerosis (ICD10-I70.0). Electronically Signed   By: Jeannine Boga M.D.   On: 02/26/2022 21:52   MR BRAIN WO CONTRAST  Result Date: 02/26/2022 CLINICAL DATA:  Neuro deficit, acute, stroke suspected. Slurred speech. EXAM: MRI HEAD WITHOUT CONTRAST TECHNIQUE: Multiplanar, multiecho pulse sequences of the brain and surrounding structures were obtained without intravenous contrast. COMPARISON:  CT studies earlier today.  MRI 01/20/2020. FINDINGS: Brain: Diffusion imaging does not show any acute or subacute infarction. No focal abnormality affects the brainstem or cerebellum. Cerebral hemispheres show age related volume loss with minimal small vessel change of the white matter. Old lacunar infarction in the right caudate head. No mass, hemorrhage, hydrocephalus or extra-axial collection. Vascular: Major vessels at the base of the brain show flow. Skull and upper cervical spine: Negative Sinuses/Orbits: Rhinosinusitis of the right maxillary sinus. Other sinuses clear. Orbits negative. Other: None IMPRESSION: No acute intracranial finding. Mild chronic small-vessel change of the hemispheric white matter. Old lacunar infarction of the right caudate head. Right maxillary rhinosinusitis. Electronically Signed   By: Nelson Chimes M.D.   On: 02/26/2022  18:27   ECHOCARDIOGRAM COMPLETE  Result Date: 02/17/2022    ECHOCARDIOGRAM REPORT   Patient Name:   Linda [name] Date of Exam: 02/17/2022 Medical Rec #:  [id number]          Height:       64.0 in Accession #:    [id number]         Weight:       143.2 lb Date of Birth:  December 21, 1946          BSA:          1.697 m Patient Age:    50 years           BP:           167/119 mmHg Patient Gender: F                  HR:           65 bpm. Exam Location:  Inpatient Procedure: 2D Echo, Cardiac Doppler and Color Doppler Indications:    Syncope R55  History:        Patient has prior history of Echocardiogram examinations, most                 recent 10/21/2017. CHF, COPD; Risk Factors:Former Smoker,                 Hypertension and Sleep Apnea. Multiple falls within the last few                 weeks.  Sonographer:  Darlina Sicilian RDCS Referring Phys: [id number] [place]  1. Left ventricular ejection fraction, by estimation, is 70 to 75%. The left ventricle has hyperdynamic function. The left ventricle has no regional wall motion abnormalities. Left ventricular diastolic parameters are consistent with Grade I diastolic dysfunction (impaired relaxation).  2. Right ventricular systolic function is normal. The right ventricular size is normal. Tricuspid regurgitation signal is inadequate for assessing PA pressure.  3. The mitral valve is normal in structure. Trivial mitral valve regurgitation. No evidence of mitral stenosis.  4. The aortic valve is tricuspid. Aortic valve regurgitation is not visualized. Aortic valve sclerosis is present, with no evidence of aortic valve stenosis.  5. The inferior vena cava is normal in size with greater than 50% respiratory variability, suggesting right atrial pressure of 3 mmHg. FINDINGS  Left Ventricle: Left ventricular ejection fraction, by estimation, is 70 to 75%. The left ventricle has hyperdynamic function. The left ventricle has no regional wall motion  abnormalities. The left ventricular internal cavity size was normal in size. There is no left ventricular hypertrophy. Left ventricular diastolic parameters are consistent with Grade I diastolic dysfunction (impaired relaxation). Right Ventricle: The right ventricular size is normal. Right ventricular systolic function is normal. Tricuspid regurgitation signal is inadequate for assessing PA pressure. The tricuspid regurgitant velocity is 1.68 m/s, and with an assumed right atrial  pressure of 3 mmHg, the estimated right ventricular systolic pressure is 57.8 mmHg. Left Atrium: Left atrial size was normal in size. Right Atrium: Right atrial size was normal in size. Pericardium: There is no evidence of pericardial effusion. Mitral Valve: The mitral valve is normal in structure. Mild mitral annular calcification. Trivial mitral valve regurgitation. No evidence of mitral valve stenosis. Tricuspid Valve: The tricuspid valve is normal in structure. Tricuspid valve regurgitation is trivial. No evidence of tricuspid stenosis. Aortic Valve: The aortic valve is tricuspid. Aortic valve regurgitation is not visualized. Aortic valve sclerosis is present, with no evidence of aortic valve stenosis. Pulmonic Valve: The pulmonic valve was normal in structure. Pulmonic valve regurgitation is trivial. No evidence of pulmonic stenosis. Aorta: The aortic root is normal in size and structure. Venous: The inferior vena cava is normal in size with greater than 50% respiratory variability, suggesting right atrial pressure of 3 mmHg. IAS/Shunts: No atrial level shunt detected by color flow Doppler.  LEFT VENTRICLE PLAX 2D LVIDd:         4.10 cm   Diastology LVIDs:         3.30 cm   LV e' medial:    4.22 cm/s LV PW:         0.70 cm   LV E/e' medial:  11.4 LV IVS:        1.40 cm   LV e' lateral:   5.51 cm/s LVOT diam:     2.00 cm   LV E/e' lateral: 8.7 LV SV:         77 LV SV Index:   46 LVOT Area:     3.14 cm  RIGHT VENTRICLE RV S prime:      15.30 cm/s TAPSE (M-mode): 2.3 cm LEFT ATRIUM             Index        RIGHT ATRIUM          Index LA diam:        3.50 cm 2.06 cm/m   RA Area:     8.40 cm LA Vol (A2C):   25.5  ml 15.02 ml/m  RA Volume:   11.30 ml 6.66 ml/m LA Vol (A4C):   43.0 ml 25.33 ml/m LA Biplane Vol: 33.8 ml 19.91 ml/m  AORTIC VALVE LVOT Vmax:   106.00 cm/s LVOT Vmean:  75.400 cm/s LVOT VTI:    0.246 m  AORTA Ao Root diam: 2.80 cm Ao Asc diam:  2.90 cm MITRAL VALVE               TRICUSPID VALVE MV Area (PHT): 2.03 cm    TR Peak grad:   11.3 mmHg MV Decel Time: 373 msec    TR Vmax:        168.00 cm/s MV E velocity: 48.00 cm/s MV A velocity: 65.60 cm/s  SHUNTS MV E/A ratio:  0.73        Systemic VTI:  0.25 m                            Systemic Diam: 2.00 cm [name] MD Electronically signed by [name] MD Signature Date/Time: 02/17/2022/10:49:04 AM    Final    CT HEAD CODE STROKE WO CONTRAST  Result Date: 02/26/2022 CLINICAL DATA:  Code stroke.  Neuro deficit, acute, stroke suspected EXAM: CT HEAD WITHOUT CONTRAST TECHNIQUE: Contiguous axial images were obtained from the base of the skull through the vertex without intravenous contrast. RADIATION DOSE REDUCTION: This exam was performed according to the departmental dose-optimization program which includes automated exposure control, adjustment of the mA and/or kV according to patient size and/or use of iterative reconstruction technique. COMPARISON:  February 15, 2022. FINDINGS: Brain: No evidence of acute large vascular territory infarction, hemorrhage, hydrocephalus, extra-axial collection or mass lesion/mass effect. Partially empty sella. Inferior aspect of the posterior fossa is incompletely imaged. Vascular: No hyperdense vessel identified. Skull: No acute fracture. Sinuses/Orbits: Clear sinuses.  No acute orbital findings. Other: No mastoid effusions. ASPECTS [facility] Stroke Program Early CT Score) total score (0-10 with 10 being normal): 10. IMPRESSION: 1. No  evidence of acute in intracranial abnormality. 2. ASPECTS is 10 Code stroke imaging results were communicated on 02/26/2022 at 12:04 pm to provider Dr. Rory Percy via secure text paging. Electronically Signed   By: Margaretha Sheffield M.D.   On: 02/26/2022 12:06   VAS US CAROTID  Result Date: 02/17/2022 Carotid Arterial Duplex Study Patient Name:  Linda [name]  Date of Exam:   02/16/2022 Medical Rec #: [id number]           Accession #:    [id number] Date of Birth: November 17, 1946           Patient Gender: F Patient Age:   13 years Exam Location:  [facility] Procedure:      VAS US CAROTID Referring Phys: DAVID ORTIZ --------------------------------------------------------------------------------  Indications:       Syncope. Risk Factors:      Hypertension, no history of smoking, coronary artery disease. Other Factors:     CHF. Limitations        Today's exam was limited due to poor ultrasound                    tissue/interface, vessel calcification/shadowing, and very                    tortuous vessels. Comparison Study:  Previous exam 10/21/2017 Performing Technologist: Rogelia Rohrer RVT, RDMS  Examination Guidelines: A complete evaluation includes B-mode imaging, spectral Doppler, color Doppler, and power Doppler as  needed of all accessible portions of each vessel. Bilateral testing is considered an integral part of a complete examination. Limited examinations for reoccurring indications may be performed as noted.  Right Carotid Findings: +----------+--------+--------+--------+-------------------------+--------+             PSV cm/s EDV cm/s Stenosis Plaque Description        Comments  +----------+--------+--------+--------+-------------------------+--------+  CCA Prox   74       14                                                    +----------+--------+--------+--------+-------------------------+--------+  CCA Distal 62       16                homogeneous and smooth               +----------+--------+--------+--------+-------------------------+--------+  ICA Prox   159      27       40-59%   heterogenous and calcific tortuous  +----------+--------+--------+--------+-------------------------+--------+  ICA Mid    79       21                                          tortuous  +----------+--------+--------+--------+-------------------------+--------+  ICA Distal 115      33                                          tortuous  +----------+--------+--------+--------+-------------------------+--------+  ECA        265      37       >50%     heterogenous and calcific tortuous  +----------+--------+--------+--------+-------------------------+--------+ +----------+--------+-------+----------------+-------------------+             PSV cm/s EDV cms Describe         Arm Pressure (mmHG)  +----------+--------+-------+----------------+-------------------+  Subclavian 129              Multiphasic, WNL                      +----------+--------+-------+----------------+-------------------+ +---------+--------+--------+--------------+  Vertebral PSV cm/s EDV cm/s Not identified  +---------+--------+--------+--------------+  Left Carotid Findings: +----------+--------+--------+--------+-------------------------+---------+             PSV cm/s EDV cm/s Stenosis Plaque Description        Comments   +----------+--------+--------+--------+-------------------------+---------+  CCA Prox   86       18                                          tortuous   +----------+--------+--------+--------+-------------------------+---------+  CCA Distal 58       14                smooth and homogeneous               +----------+--------+--------+--------+-------------------------+---------+  ICA Prox   92       18       1-39%    heterogenous and calcific            +----------+--------+--------+--------+-------------------------+---------+  ICA Distal 125      35                                          tortuous    +----------+--------+--------+--------+-------------------------+---------+  ECA        78       12                                          shadowing  +----------+--------+--------+--------+-------------------------+---------+ +----------+--------+--------+----------------+-------------------+             PSV cm/s EDV cm/s Describe         Arm Pressure (mmHG)  +----------+--------+--------+----------------+-------------------+  Subclavian 105               Multiphasic, WNL                      +----------+--------+--------+----------------+-------------------+ +---------+--------+--+--------+--+---------+  Vertebral PSV cm/s 59 EDV cm/s 14 Antegrade  +---------+--------+--+--------+--+---------+   Summary: Right Carotid: Velocities in the right ICA are consistent with a 40-59%                stenosis. Left Carotid: Velocities in the left ICA are consistent with a 1-39% stenosis. Vertebrals:  Left vertebral artery demonstrates antegrade flow. Right vertebral              artery was not visualized. Subclavians: Normal flow hemodynamics were seen in bilateral subclavian              arteries. *See table(s) above for measurements and observations.  Electronically signed by Monica Martinez MD on 02/17/2022 at 10:27:37 AM.    Final    CT ANGIO HEAD NECK W WO CM W PERF (CODE STROKE)  Result Date: 02/26/2022 CLINICAL DATA:  Neuro deficit, acute, stroke suspected. EXAM: CT ANGIOGRAPHY HEAD AND NECK CT PERFUSION BRAIN TECHNIQUE: Multidetector CT imaging of the head and neck was performed using the standard protocol during bolus administration of intravenous contrast. Multiplanar CT image reconstructions and MIPs were obtained to evaluate the vascular anatomy. Carotid stenosis measurements (when applicable) are obtained utilizing NASCET criteria, using the distal internal carotid diameter as the denominator. Multiphase CT imaging of the brain was performed following IV bolus contrast injection. Subsequent parametric  perfusion maps were calculated using RAPID software. RADIATION DOSE REDUCTION: This exam was performed according to the departmental dose-optimization program which includes automated exposure control, adjustment of the mA and/or kV according to patient size and/or use of iterative reconstruction technique. CONTRAST:  126m OMNIPAQUE IOHEXOL 350 MG/ML SOLN COMPARISON:  CT head without contrast 01/29/2022. FINDINGS: CTA NECK FINDINGS Aortic arch: Atherosclerotic calcifications are present about the origin of the left subclavian artery without significant stenosis or aneurysm. Origins of the common carotid artery and innominate artery are not imaged. Right carotid system: The right common carotid artery is within normal limits. Atherosclerotic calcifications are present at the bifurcation. Minimal luminal diameter is 2 mm. Mild tortuosity is present in the more distal right ICA without other significant stenosis. Distal ICA measures 3.8 mm. Left carotid system: The left common carotid artery is tortuous without significant stenosis. Atherosclerotic calcifications are present at the lateral aspect of the left carotid bifurcation without a significant stenosis relative to the more distal vessel. Cervical left ICA is otherwise within normal limits.  Vertebral arteries: The left vertebral artery is the dominant vessel. Both vertebral arteries originate from the subclavian arteries without significant stenosis. No significant stenosis is present in either vertebral artery in the neck. Moderate stenoses are present in both vertebral arteries at the dural margin. Skeleton: Fusion noted across the disc space C5-6. Advanced degenerative changes present at C6-7 right at C4-5. No focal osseous lesions are present. Acute or healing fractures are present. Other neck: An 11 mm nodule is present in the inferior right lobe of the thyroid. The submandibular and parotid glands and ducts are within normal limits. No mucosal lesions are  present. No significant adenopathy is present. Upper chest: The lung apices are clear. Thoracic inlet is within normal limits. Review of the MIP images confirms the above findings CTA HEAD FINDINGS Anterior circulation: Atherosclerotic calcifications are present within the cavernous internal carotid arteries bilaterally. No significant stenosis is present through the ICA termini. The A1 and M1 segments are normal. Anterior communicating artery is patent. MCA bifurcations are intact. ACA and MCA branch vessels are within normal limits. Posterior circulation: Atherosclerotic calcifications present at dural margin of both vertebral arteries. Moderate stenosis is present in the hypoplastic right V3 segment. Distal V4 segment does fill. Left PICA origin is visualized and normal. Dominant right AICA vessel is present. Basilar artery is within normal limits. Both posterior cerebral arteries originate from the basilar tip. Moderate stenosis is present in the right P2 segment without a distal occlusion. Left PCA branch vessels are within normal limits. Venous sinuses: The dural sinuses are patent. The straight sinus deep cerebral veins are intact. Cortical veins are within normal limits. No significant vascular malformation is evident. Anatomic variants: None Review of the MIP images confirms the above findings CT Brain Perfusion Findings: ASPECTS: 10/10 CBF (<30%) Volume: 56m Perfusion (Tmax>6.0s) volume: 015mIMPRESSION: 1. No emergent large vessel occlusion. 2. CT perfusion demonstrates no significant core infarct or significant ischemia. 3. Atherosclerotic changes at the carotid bifurcations bilaterally, within the cavernous internal carotid arteries, and at the dural margin of both vertebral arteries. 4. Moderate stenosis in the hypoplastic right V3 segment and distal right V4 segment. The distal right V4 segment does fill. 5. Moderate stenosis of the right P2 segment. 6. Degenerative changes in the cervical spine as  described. 7. 11 mm nodule in the inferior right lobe of the thyroid. Not clinically significant; no follow-up imaging recommended (ref: J Am Coll Radiol. 2015 Feb;12(2): 143-50). 8. Aortic Atherosclerosis (ICD10-I70.0). Electronically Signed   By: ChSan Morelle.D.   On: 02/26/2022 12:27   CT Maxillofacial Wo Contrast  Result Date: 02/15/2022 CLINICAL DATA:  Facial trauma, blunt.  Multiple falls. EXAM: CT MAXILLOFACIAL WITHOUT CONTRAST TECHNIQUE: Multidetector CT imaging of the maxillofacial structures was performed. Multiplanar CT image reconstructions were also generated. RADIATION DOSE REDUCTION: This exam was performed according to the departmental dose-optimization program which includes automated exposure control, adjustment of the mA and/or kV according to patient size and/or use of iterative reconstruction technique. COMPARISON:  None. FINDINGS: Osseous: Fracture noted through the inferior anterior wall of the right maxillary sinus. Orbits: Mildly depressed fracture noted through the floor of the right orbit. No muscular entrapment. Sinuses: Blood within the right maxillary sinus and scattered right ethmoid air cells. Soft tissues: Negative Limited intracranial: See head CT report IMPRESSION: Fracture through the anterior inferior wall of the right maxillary sinus. Fracture through the right orbital floor with mild depression. Electronically Signed   By: KeRolm Baptise.D.   On:  02/15/2022 23:15     Discharge Exam: Vitals:   02/27/22 0859 02/27/22 1206  BP: (!) 160/63 (!) 134/58  Pulse: 62 64  Resp:  15  Temp:  97.7 F (36.5 C)  SpO2:  95%   Vitals:   02/27/22 0650 02/27/22 0742 02/27/22 0859 02/27/22 1206  BP: 132/63 (!) 156/67 (!) 160/63 (!) 134/58  Pulse: (!) 57 60 62 64  Resp: '18 12  15  '$ Temp: 97.8 F (36.6 C) (!) 97.4 F (36.3 C)  97.7 F (36.5 C)  TempSrc: Oral Oral  Oral  SpO2: 93% 95%  95%  Weight:      Height:        General: Pt is alert, awake, not in acute  distress Cardiovascular: RRR, S1/S2 +, no rubs, no gallops Respiratory: CTA bilaterally, no wheezing, no rhonchi Abdominal: Soft, NT, ND, bowel sounds + Extremities: no edema, no cyanosis    The results of significant diagnostics from this hospitalization (including imaging, microbiology, ancillary and laboratory) are listed below for reference.     Microbiology: Recent Results (from the past 240 hour(s))  Resp Panel by RT-PCR (Flu A&B, Covid) Nasopharyngeal Swab     Status: None   Collection Time: 02/27/22 12:49 AM   Specimen: Nasopharyngeal Swab; Nasopharyngeal(NP) swabs in vial transport medium  Result Value Ref Range Status   SARS Coronavirus 2 by RT PCR NEGATIVE NEGATIVE Final    Comment: (NOTE) SARS-CoV-2 target nucleic acids are NOT DETECTED.  The SARS-CoV-2 RNA is generally detectable in upper respiratory specimens during the acute phase of infection. The lowest concentration of SARS-CoV-2 viral copies this assay can detect is 138 copies/mL. A negative result does not preclude SARS-Cov-2 infection and should not be used as the sole basis for treatment or other patient management decisions. A negative result may occur with  improper specimen collection/handling, submission of specimen other than nasopharyngeal swab, presence of viral mutation(s) within the areas targeted by this assay, and inadequate number of viral copies(<138 copies/mL). A negative result must be combined with clinical observations, patient history, and epidemiological information. The expected result is Negative.  Fact Sheet for Patients:  EntrepreneurPulse.com.au  Fact Sheet for Healthcare Providers:  IncredibleEmployment.be  This test is no t yet approved or cleared by the [place] FDA and  has been authorized for detection and/or diagnosis of SARS-CoV-2 by FDA under an Emergency Use Authorization (EUA). This EUA will remain  in effect (meaning this test  can be used) for the duration of the COVID-19 declaration under Section 564(b)(1) of the Act, 21 U.S.C.section 360bbb-3(b)(1), unless the authorization is terminated  or revoked sooner.       Influenza A by PCR NEGATIVE NEGATIVE Final   Influenza B by PCR NEGATIVE NEGATIVE Final    Comment: (NOTE) The Xpert Xpress SARS-CoV-2/FLU/RSV plus assay is intended as an aid in the diagnosis of influenza from Nasopharyngeal swab specimens and should not be used as a sole basis for treatment. Nasal washings and aspirates are unacceptable for Xpert Xpress SARS-CoV-2/FLU/RSV testing.  Fact Sheet for Patients: EntrepreneurPulse.com.au  Fact Sheet for Healthcare Providers: IncredibleEmployment.be  This test is not yet approved or cleared by the [place] FDA and has been authorized for detection and/or diagnosis of SARS-CoV-2 by FDA under an Emergency Use Authorization (EUA). This EUA will remain in effect (meaning this test can be used) for the duration of the COVID-19 declaration under Section 564(b)(1) of the Act, 21 U.S.C. section 360bbb-3(b)(1), unless the authorization is terminated or revoked.  Performed at [facility], [place] [address], [place], [place] [address]      Labs: BNP (last 3 results) No results for input(s): BNP in the last 8760 hours. Basic Metabolic Panel: Recent Labs  Lab 02/26/22 1151 02/26/22 1152 02/27/22 0156 02/27/22 0442  NA 139 141 138 141  K 3.9 3.9 3.0* 3.0*  CL 105 103  --  100  CO2 26  --   --  27  GLUCOSE 103* 102*  --  82  BUN 9 8  --  6*  CREATININE 0.83 0.70  --  0.70  CALCIUM 8.8*  --   --  9.4  MG  --   --   --  1.8  PHOS  --   --   --  4.0   Liver Function Tests: Recent Labs  Lab 02/26/22 1151 02/27/22 0442  AST 22 21  ALT 12 12  ALKPHOS 86 99  BILITOT 0.4 0.6  PROT 6.6 8.3*  ALBUMIN 3.6 4.3   No results for input(s): LIPASE, AMYLASE in the last 168 hours. Recent Labs  Lab  02/27/22 0049  AMMONIA 27   CBC: Recent Labs  Lab 02/26/22 1151 02/26/22 1152 02/27/22 0156 02/27/22 0442  WBC 8.2  --   --  6.3  NEUTROABS 5.6  --   --  3.1  HGB 11.8* 11.9* 12.2 14.2  HCT 36.1 35.0* 36.0 43.0  MCV 88.7  --   --  86.0  PLT 222  --   --  249   Cardiac Enzymes: No results for input(s): CKTOTAL, CKMB, CKMBINDEX, TROPONINI in the last 168 hours. BNP: Invalid input(s): POCBNP CBG: Recent Labs  Lab 02/26/22 1147  GLUCAP 102*   D-Dimer No results for input(s): DDIMER in the last 72 hours. Hgb A1c Recent Labs    02/27/22 0442  HGBA1C 5.3   Lipid Profile No results for input(s): CHOL, HDL, LDLCALC, TRIG, CHOLHDL, LDLDIRECT in the last 72 hours. Thyroid function studies Recent Labs    02/27/22 0442  TSH 1.488   Anemia work up Recent Labs    02/27/22 0442  VITAMINB12 962*   Urinalysis    Component Value Date/Time   COLORURINE STRAW (A) 02/26/2022 1453   APPEARANCEUR CLEAR 02/26/2022 1453   LABSPEC 1.034 (H) 02/26/2022 1453   PHURINE 7.0 02/26/2022 1453   GLUCOSEU NEGATIVE 02/26/2022 1453   HGBUR NEGATIVE 02/26/2022 1453   BILIRUBINUR NEGATIVE 02/26/2022 1453   BILIRUBINUR negative 07/16/2021 1147   KETONESUR NEGATIVE 02/26/2022 1453   PROTEINUR NEGATIVE 02/26/2022 1453   UROBILINOGEN 0.2 07/16/2021 1147   UROBILINOGEN 1.0 10/16/2015 2009   NITRITE NEGATIVE 02/26/2022 1453   LEUKOCYTESUR SMALL (A) 02/26/2022 1453   Sepsis Labs Invalid input(s): PROCALCITONIN,  WBC,  LACTICIDVEN Microbiology Recent Results (from the past 240 hour(s))  Resp Panel by RT-PCR (Flu A&B, Covid) Nasopharyngeal Swab     Status: None   Collection Time: 02/27/22 12:49 AM   Specimen: Nasopharyngeal Swab; Nasopharyngeal(NP) swabs in vial transport medium  Result Value Ref Range Status   SARS Coronavirus 2 by RT PCR NEGATIVE NEGATIVE Final    Comment: (NOTE) SARS-CoV-2 target nucleic acids are NOT DETECTED.  The SARS-CoV-2 RNA is generally detectable in upper  respiratory specimens during the acute phase of infection. The lowest concentration of SARS-CoV-2 viral copies this assay can detect is 138 copies/mL. A negative result does not preclude SARS-Cov-2 infection and should not be used as the sole basis for treatment or other patient management decisions.  A negative result may occur with  improper specimen collection/handling, submission of specimen other than nasopharyngeal swab, presence of viral mutation(s) within the areas targeted by this assay, and inadequate number of viral copies(<138 copies/mL). A negative result must be combined with clinical observations, patient history, and epidemiological information. The expected result is Negative.  Fact Sheet for Patients:  EntrepreneurPulse.com.au  Fact Sheet for Healthcare Providers:  IncredibleEmployment.be  This test is no t yet approved or cleared by the [place] FDA and  has been authorized for detection and/or diagnosis of SARS-CoV-2 by FDA under an Emergency Use Authorization (EUA). This EUA will remain  in effect (meaning this test can be used) for the duration of the COVID-19 declaration under Section 564(b)(1) of the Act, 21 U.S.C.section 360bbb-3(b)(1), unless the authorization is terminated  or revoked sooner.       Influenza A by PCR NEGATIVE NEGATIVE Final   Influenza B by PCR NEGATIVE NEGATIVE Final    Comment: (NOTE) The Xpert Xpress SARS-CoV-2/FLU/RSV plus assay is intended as an aid in the diagnosis of influenza from Nasopharyngeal swab specimens and should not be used as a sole basis for treatment. Nasal washings and aspirates are unacceptable for Xpert Xpress SARS-CoV-2/FLU/RSV testing.  Fact Sheet for Patients: EntrepreneurPulse.com.au  Fact Sheet for Healthcare Providers: IncredibleEmployment.be  This test is not yet approved or cleared by the [place] FDA and has been  authorized for detection and/or diagnosis of SARS-CoV-2 by FDA under an Emergency Use Authorization (EUA). This EUA will remain in effect (meaning this test can be used) for the duration of the COVID-19 declaration under Section 564(b)(1) of the Act, 21 U.S.C. section 360bbb-3(b)(1), unless the authorization is terminated or revoked.  Performed at [facility], [place] [address], [place], [place] [address]      Time coordinating discharge: Over 30 minutes  SIGNED:   Darliss Cheney, MD  [facility] 02/27/2022, 12:57 PM *Please note that this is a verbal dictation therefore any spelling or grammatical errors are due to the "[employer]" system interpretation. If 7PM-7AM, please contact night-coverage [email or web address]

## 2022-02-28 ENCOUNTER — Other Ambulatory Visit: Payer: Self-pay | Admitting: Family Medicine

## 2022-02-28 ENCOUNTER — Encounter: Payer: Self-pay | Admitting: Family Medicine

## 2022-02-28 DIAGNOSIS — R2689 Other abnormalities of gait and mobility: Secondary | ICD-10-CM | POA: Diagnosis not present

## 2022-02-28 DIAGNOSIS — R41841 Cognitive communication deficit: Secondary | ICD-10-CM | POA: Diagnosis not present

## 2022-02-28 DIAGNOSIS — R488 Other symbolic dysfunctions: Secondary | ICD-10-CM | POA: Diagnosis not present

## 2022-02-28 DIAGNOSIS — R296 Repeated falls: Secondary | ICD-10-CM | POA: Diagnosis not present

## 2022-02-28 DIAGNOSIS — M6281 Muscle weakness (generalized): Secondary | ICD-10-CM | POA: Diagnosis not present

## 2022-02-28 DIAGNOSIS — R1312 Dysphagia, oropharyngeal phase: Secondary | ICD-10-CM | POA: Diagnosis not present

## 2022-02-28 NOTE — Telephone Encounter (Signed)
FYI: ?Lelon Frohlich [employer]) calling to inform us that pt had another fall last Saturday 02/23/2022. Pt fractured her back. ? ?Lelon Frohlich also mentioned that pt has been taken several medications, that she believes may have caused her fall. ? ?Lelon Frohlich will call tomorrow to schedule an appt for patient. Pt was discharged from hospital today on 02/28/2022. ?

## 2022-02-28 NOTE — Telephone Encounter (Signed)
Noted  

## 2022-02-28 NOTE — Telephone Encounter (Signed)
FYI  Please see below

## 2022-03-01 DIAGNOSIS — R296 Repeated falls: Secondary | ICD-10-CM | POA: Diagnosis not present

## 2022-03-01 DIAGNOSIS — R488 Other symbolic dysfunctions: Secondary | ICD-10-CM | POA: Diagnosis not present

## 2022-03-01 DIAGNOSIS — R1312 Dysphagia, oropharyngeal phase: Secondary | ICD-10-CM | POA: Diagnosis not present

## 2022-03-01 DIAGNOSIS — R2689 Other abnormalities of gait and mobility: Secondary | ICD-10-CM | POA: Diagnosis not present

## 2022-03-01 DIAGNOSIS — M6281 Muscle weakness (generalized): Secondary | ICD-10-CM | POA: Diagnosis not present

## 2022-03-01 DIAGNOSIS — R41841 Cognitive communication deficit: Secondary | ICD-10-CM | POA: Diagnosis not present

## 2022-03-04 ENCOUNTER — Inpatient Hospital Stay: Payer: Medicare Other | Admitting: Family Medicine

## 2022-03-04 DIAGNOSIS — M6281 Muscle weakness (generalized): Secondary | ICD-10-CM | POA: Diagnosis not present

## 2022-03-04 DIAGNOSIS — R1312 Dysphagia, oropharyngeal phase: Secondary | ICD-10-CM | POA: Diagnosis not present

## 2022-03-04 DIAGNOSIS — R488 Other symbolic dysfunctions: Secondary | ICD-10-CM | POA: Diagnosis not present

## 2022-03-04 DIAGNOSIS — R41841 Cognitive communication deficit: Secondary | ICD-10-CM | POA: Diagnosis not present

## 2022-03-04 DIAGNOSIS — R296 Repeated falls: Secondary | ICD-10-CM | POA: Diagnosis not present

## 2022-03-04 DIAGNOSIS — R2689 Other abnormalities of gait and mobility: Secondary | ICD-10-CM | POA: Diagnosis not present

## 2022-03-04 DIAGNOSIS — Z20822 Contact with and (suspected) exposure to covid-19: Secondary | ICD-10-CM | POA: Diagnosis not present

## 2022-03-05 DIAGNOSIS — R41841 Cognitive communication deficit: Secondary | ICD-10-CM | POA: Diagnosis not present

## 2022-03-05 DIAGNOSIS — R2689 Other abnormalities of gait and mobility: Secondary | ICD-10-CM | POA: Diagnosis not present

## 2022-03-05 DIAGNOSIS — R296 Repeated falls: Secondary | ICD-10-CM | POA: Diagnosis not present

## 2022-03-05 DIAGNOSIS — R488 Other symbolic dysfunctions: Secondary | ICD-10-CM | POA: Diagnosis not present

## 2022-03-05 DIAGNOSIS — M6281 Muscle weakness (generalized): Secondary | ICD-10-CM | POA: Diagnosis not present

## 2022-03-05 DIAGNOSIS — R1312 Dysphagia, oropharyngeal phase: Secondary | ICD-10-CM | POA: Diagnosis not present

## 2022-03-06 DIAGNOSIS — M6281 Muscle weakness (generalized): Secondary | ICD-10-CM | POA: Diagnosis not present

## 2022-03-06 DIAGNOSIS — R1312 Dysphagia, oropharyngeal phase: Secondary | ICD-10-CM | POA: Diagnosis not present

## 2022-03-06 DIAGNOSIS — R296 Repeated falls: Secondary | ICD-10-CM | POA: Diagnosis not present

## 2022-03-06 DIAGNOSIS — R2689 Other abnormalities of gait and mobility: Secondary | ICD-10-CM | POA: Diagnosis not present

## 2022-03-06 DIAGNOSIS — R488 Other symbolic dysfunctions: Secondary | ICD-10-CM | POA: Diagnosis not present

## 2022-03-06 DIAGNOSIS — R41841 Cognitive communication deficit: Secondary | ICD-10-CM | POA: Diagnosis not present

## 2022-03-07 ENCOUNTER — Encounter: Payer: Medicare Other | Attending: Physical Medicine & Rehabilitation | Admitting: Registered Nurse

## 2022-03-07 ENCOUNTER — Other Ambulatory Visit: Payer: Self-pay

## 2022-03-07 ENCOUNTER — Encounter: Payer: Self-pay | Admitting: Registered Nurse

## 2022-03-07 VITALS — BP 124/79 | HR 82 | Ht 64.0 in | Wt 142.0 lb

## 2022-03-07 DIAGNOSIS — Z5181 Encounter for therapeutic drug level monitoring: Secondary | ICD-10-CM | POA: Diagnosis not present

## 2022-03-07 DIAGNOSIS — M25511 Pain in right shoulder: Secondary | ICD-10-CM | POA: Diagnosis not present

## 2022-03-07 DIAGNOSIS — R0902 Hypoxemia: Secondary | ICD-10-CM

## 2022-03-07 DIAGNOSIS — M1711 Unilateral primary osteoarthritis, right knee: Secondary | ICD-10-CM | POA: Diagnosis not present

## 2022-03-07 DIAGNOSIS — Z79899 Other long term (current) drug therapy: Secondary | ICD-10-CM

## 2022-03-07 DIAGNOSIS — M25512 Pain in left shoulder: Secondary | ICD-10-CM

## 2022-03-07 DIAGNOSIS — G8929 Other chronic pain: Secondary | ICD-10-CM

## 2022-03-07 DIAGNOSIS — M546 Pain in thoracic spine: Secondary | ICD-10-CM | POA: Diagnosis not present

## 2022-03-07 DIAGNOSIS — G894 Chronic pain syndrome: Secondary | ICD-10-CM | POA: Diagnosis not present

## 2022-03-07 DIAGNOSIS — M1712 Unilateral primary osteoarthritis, left knee: Secondary | ICD-10-CM

## 2022-03-07 DIAGNOSIS — I2510 Atherosclerotic heart disease of native coronary artery without angina pectoris: Secondary | ICD-10-CM | POA: Diagnosis not present

## 2022-03-07 DIAGNOSIS — M6281 Muscle weakness (generalized): Secondary | ICD-10-CM | POA: Diagnosis not present

## 2022-03-07 DIAGNOSIS — R488 Other symbolic dysfunctions: Secondary | ICD-10-CM | POA: Diagnosis not present

## 2022-03-07 DIAGNOSIS — R2689 Other abnormalities of gait and mobility: Secondary | ICD-10-CM | POA: Diagnosis not present

## 2022-03-07 DIAGNOSIS — R296 Repeated falls: Secondary | ICD-10-CM | POA: Diagnosis not present

## 2022-03-07 DIAGNOSIS — R1312 Dysphagia, oropharyngeal phase: Secondary | ICD-10-CM | POA: Diagnosis not present

## 2022-03-07 DIAGNOSIS — R41841 Cognitive communication deficit: Secondary | ICD-10-CM | POA: Diagnosis not present

## 2022-03-07 DIAGNOSIS — M5416 Radiculopathy, lumbar region: Secondary | ICD-10-CM

## 2022-03-07 MED ORDER — OXYCODONE-ACETAMINOPHEN 10-325 MG PO TABS: 1.0000 | ORAL_TABLET | Freq: Four times a day (QID) | ORAL | 0 refills | Status: DC | PRN

## 2022-03-07 NOTE — Patient Instructions (Addendum)
Newman Pies, MD. Schedule an appointment as soon as possible for a visit in 2 week(s).   ?Specialty: Neurosurgery ?Contact information: ?[address] ?[address] ?[place] [address] ?[phone] ? ? ?Call  Zella Ball the first week of May: Regarding your Oxycodone:  May 1- 3 rd.  ?  ? ?

## 2022-03-07 NOTE — Progress Notes (Signed)
? ?Subjective:  ? ? Patient ID: Linda [name], female    DOB: 1946/08/20, 76 y.o.   MRN: [id number] ? ?HPI: Linda [name] is a 76 y.o. female who returns for follow up appointment for chronic pain and medication refill. She states her pain is located in her lower back radiating into her right hip, right lower extremity and bilateral knee pain. She rates her pain 9. Her current exercise regime is walking and performing stretching exercises. ? ?Linda [name] boyfriend in room and stated, " Linda [name] medication needs to be increased due to her increase intensity of pain, this provider spoke to Linda [name] in detail regarding his statement. Linda [name] never expressed she was having increase intensity of pain, she never asked for increase in her pain medication. Reviewed with Linda [name] the narcotic contract, the above will be discussed with [name], she verbalizes understanding.  ? ?Linda [name] was admitted to the [facility] on 02/16/2022- 02/17/2022, S/P fall syncopal episode, discharge summary was reviewed.  ? ?Linda [name] was educated on Falls prevention and was instructed to use her walker at all times, she verbalizes understanding.  ? ?Linda [name] was admitted to [facility] on 02/26/2022 and discharged home on 02/27/2022, she was found with slurred speech and was brought to [facility]. Discharged Summary was Reviewed.  She was also diagnosed with acute compression fracture of T12 with mild 25% height loss and additionally mild chronic compression deformities involving T1, T3, T5, T7 and T9 vertebral bodies. She is wearing TLSO Brace.  ? ? ?Linda [name] Morphine equivalent is 60.00 MME.She is also prescribed Alprazolam  by Dr. Ernestine Conrad .We have discussed the black box warning of using opioids and benzodiazepines. I highlighted the dangers of using these drugs together and discussed the adverse events including respiratory suppression, overdose, cognitive impairment and importance of  compliance with current regimen. We will continue to monitor and adjust as indicated.  ? ?Last Oral Swab was Performed on 02/08/2022, it was consistent.  ?  ? ?Pain Inventory ?Average Pain 7 ?Pain Right Now 9 ?My pain is constant, sharp, burning, stabbing, and aching ? ?In the last 24 hours, has pain interfered with the following? ?General activity 9 ?Relation with others 9 ?Enjoyment of life 9 ?What TIME of day is your pain at its worst? morning , daytime, evening, and night ?Sleep (in general) Fair ? ?Pain is worse with: walking, bending, standing, and some activites ?Pain improves with: rest, therapy/exercise, medication, and injections ?Relief from Meds: 5 ? ?Family History  ?Problem Relation Age of Onset  ? Arthritis Mother   ? Kidney disease Mother   ? Heart disease Father   ? Stroke Father   ? Hypertension Father   ? Diabetes Father   ? Heart attack Father   ? Heart attack Paternal Grandmother   ? Diabetes Sister   ?     one sister  ? Hypertension Sister   ? Asthma Sister   ? Hypertension Brother   ? Asthma Brother   ? Asthma Daughter   ? Multiple sclerosis Son   ? ?Social History  ? ?Socioeconomic History  ? Marital status: Widowed  ?  Spouse name: Not on file  ? Number of children: 2  ? Years of education: Not on file  ? Highest education level: Not on file  ?Occupational History  ? Occupation: Retired  ?  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 Smoking status: Never  ?  Smokeless tobacco: Never  ? Tobacco comments:  ?  never used tobacco  ?Vaping Use  ? Vaping Use: Never used  ?Substance and Sexual Activity  ? Alcohol use: No  ?  Alcohol/week: 0.0 standard drinks  ? Drug use: No  ? Sexual activity: Not Currently  ?Other Topics Concern  ? Not on file  ?Social History Narrative  ? Widowed, 2 sons.  Relocated to [place] 09/2012 to be closer to her son who has Linda.  ? Husband d 2015--mesothelioma.  ? Occupation: former Pharmacist, hospital.  ? Education: masters degree level.  ? No T/A/Ds.  ? Lives in [place],  independent living.  ? ?Social Determinants of Health  ? ?Financial Resource Strain: Low Risk   ? Difficulty of Paying Living Expenses: Not hard at all  ?Food Insecurity: No Food Insecurity  ? Worried About Charity fundraiser in the Last Year: Never true  ? Ran Out of Food in the Last Year: Never true  ?Transportation Needs: No Transportation Needs  ? Lack of Transportation (Medical): No  ? Lack of Transportation (Non-Medical): No  ?Physical Activity: Insufficiently Active  ? Days of Exercise per Week: 2 days  ? Minutes of Exercise per Session: 20 min  ?Stress: No Stress Concern Present  ? Feeling of Stress : Not at all  ?Social Connections: Moderately Isolated  ? Frequency of Communication with Friends and Family: Three times a week  ? Frequency of Social Gatherings with Friends and Family: More than three times a week  ? Attends Religious Services: More than 4 times per year  ? Active Member of Clubs or Organizations: No  ? Attends Archivist Meetings: Never  ? Marital Status: Widowed  ? ?Past Surgical History:  ?Procedure Laterality Date  ? APPENDECTOMY  1960  ? AXILLARY SURGERY Left 1978  ? Multiple "lump" in armpit per pt  ? BIOPSY  06/17/2020  ? Gastric bx->gastritis, h pylori neg.  Procedure: BIOPSY;  Surgeon: Carol Ada, MD;  Location: [facility];  Service: Endoscopy;;  ? CARDIAC CATHETERIZATION  01/2013  ? nonobstructive CAD, EF 55-60%  ? CARDIOVASCULAR STRESS TEST  02/22/2015  ? Low risk myocard perf imaging; wall motion normal, normal EF  ? carotid duplex doppler  10/21/2017  ? R vertebral flow suggestive of possible distal obstruction.  Pt declined further w/u as of 10/29/17 but need to revisit this problem periodically.  ? COCCYX REMOVAL  1972  ? DEXA  06/05/2017  ? T-score -3.1  ? DILATION AND CURETTAGE OF UTERUS  ? 1970's  ? ESOPHAGOGASTRODUODENOSCOPY N/A 07/19/2014  ? Gastritis found + in the setting of supratherapeutic INR, +plavix, + meloxicam.  ? ESOPHAGOGASTRODUODENOSCOPY (EGD)  WITH PROPOFOL N/A 06/17/2020  ? NO stricture or other prob to explain pt's dysphagia, dilation was done anyway.  Gastric bx's-->gastritis, h pylori neg. Procedure: ESOPHAGOGASTRODUODENOSCOPY (EGD) WITH PROPOFOL;  Surgeon: Carol Ada, MD;  Location: [facility];  Service: Endoscopy;  Laterality: N/A;  ? EYE SURGERY Left 2012-2013  ? "injections for ~ 1 yr; don't really know what for" (07/12/2013)  ? HEEL SPUR SURGERY Left 2008  ? kidney stone removal Right   ? KNEE SURGERY  2005  ? LEFT HEART CATHETERIZATION WITH CORONARY ANGIOGRAM N/A 01/30/2013  ? Procedure: LEFT HEART CATHETERIZATION WITH CORONARY ANGIOGRAM;  Surgeon: Clent Demark, MD;  Location: [facility] CATH LAB;  Service: Cardiovascular;  Laterality: N/A;  ? MALONEY DILATION  06/17/2020  ? Procedure: Venia Minks DILATION;  Surgeon: Carol Ada, MD;  Location: [facility];  Service: Endoscopy;;  ? PLANTAR FASCIA RELEASE Left 2008  ? SPIROMETRY  04/25/2014  ? In hosp for acute asthma/COPD flare: mixed obstructive and restrictive lung disease. The FEV1 is severely reduced at 45% predicted.  FEV1 signif decreased compared to prior spirometry 07/23/13.  ? [place]  ? Right forearm and hand  ? TOTAL ABDOMINAL HYSTERECTOMY  1974  ? TRANSTHORACIC ECHOCARDIOGRAM  01/2013; 04/2014;08/2015; 09/2017  ? 2014--NORMAL.  2015--focal basal septal hypertrophy, EF 55-60%, grade I diast dysfxn, mild LAE.  08/2015 EF 55-60%, nl LV syst fxn, grade I DD, valves wnl. 10/21/17: EF 65-70%, grd I DD, o/w normal. 02/17/22 EF 70-75%, hyperdynamic LV fxn, grd I DD.  ? ?Past Surgical History:  ?Procedure Laterality Date  ? APPENDECTOMY  1960  ? AXILLARY SURGERY Left 1978  ? Multiple "lump" in armpit per pt  ? BIOPSY  06/17/2020  ? Gastric bx->gastritis, h pylori neg.  Procedure: BIOPSY;  Surgeon: Carol Ada, MD;  Location: [facility];  Service: Endoscopy;;  ? CARDIAC CATHETERIZATION  01/2013  ? nonobstructive CAD, EF 55-60%  ? CARDIOVASCULAR STRESS TEST  02/22/2015  ? Low risk  myocard perf imaging; wall motion normal, normal EF  ? carotid duplex doppler  10/21/2017  ? R vertebral flow suggestive of possible distal obstruction.  Pt declined further w/u as of 10/29/17 but need to revisit this p

## 2022-03-08 ENCOUNTER — Telehealth: Payer: Self-pay | Admitting: Registered Nurse

## 2022-03-08 DIAGNOSIS — R488 Other symbolic dysfunctions: Secondary | ICD-10-CM | POA: Diagnosis not present

## 2022-03-08 DIAGNOSIS — R41841 Cognitive communication deficit: Secondary | ICD-10-CM | POA: Diagnosis not present

## 2022-03-08 DIAGNOSIS — M6281 Muscle weakness (generalized): Secondary | ICD-10-CM | POA: Diagnosis not present

## 2022-03-08 DIAGNOSIS — R296 Repeated falls: Secondary | ICD-10-CM | POA: Diagnosis not present

## 2022-03-08 DIAGNOSIS — R2689 Other abnormalities of gait and mobility: Secondary | ICD-10-CM | POA: Diagnosis not present

## 2022-03-08 DIAGNOSIS — R1312 Dysphagia, oropharyngeal phase: Secondary | ICD-10-CM | POA: Diagnosis not present

## 2022-03-08 NOTE — Telephone Encounter (Signed)
Placed a call to Ms. Jalomo : Wellness Check;  ?Her Oxygen Saturation Today is 88%, she will put her Oxygen on at 3 liters nasal  cannula and call her Pulmonologist, she verbalizes.  ?She will check her Pulse Ox, several times a day, and report readings to her pulmonologist.  ?She also states she was wearing her  Oxygen at HS @ 3 Liters nasal cannula. ?Ms. Jolley was instructed to call 911, if she develops SOB and if her Oxygen saturation drops, she verbalizes understanding.  ?

## 2022-03-11 DIAGNOSIS — M6281 Muscle weakness (generalized): Secondary | ICD-10-CM | POA: Diagnosis not present

## 2022-03-11 DIAGNOSIS — R2689 Other abnormalities of gait and mobility: Secondary | ICD-10-CM | POA: Diagnosis not present

## 2022-03-11 DIAGNOSIS — R1312 Dysphagia, oropharyngeal phase: Secondary | ICD-10-CM | POA: Diagnosis not present

## 2022-03-11 DIAGNOSIS — R488 Other symbolic dysfunctions: Secondary | ICD-10-CM | POA: Diagnosis not present

## 2022-03-11 DIAGNOSIS — R296 Repeated falls: Secondary | ICD-10-CM | POA: Diagnosis not present

## 2022-03-11 DIAGNOSIS — R41841 Cognitive communication deficit: Secondary | ICD-10-CM | POA: Diagnosis not present

## 2022-03-12 DIAGNOSIS — R296 Repeated falls: Secondary | ICD-10-CM | POA: Diagnosis not present

## 2022-03-12 DIAGNOSIS — M6281 Muscle weakness (generalized): Secondary | ICD-10-CM | POA: Diagnosis not present

## 2022-03-12 DIAGNOSIS — R2689 Other abnormalities of gait and mobility: Secondary | ICD-10-CM | POA: Diagnosis not present

## 2022-03-12 DIAGNOSIS — R41841 Cognitive communication deficit: Secondary | ICD-10-CM | POA: Diagnosis not present

## 2022-03-12 DIAGNOSIS — R1312 Dysphagia, oropharyngeal phase: Secondary | ICD-10-CM | POA: Diagnosis not present

## 2022-03-12 DIAGNOSIS — R488 Other symbolic dysfunctions: Secondary | ICD-10-CM | POA: Diagnosis not present

## 2022-03-12 NOTE — Telephone Encounter (Signed)
Signed orders faxed.

## 2022-03-12 NOTE — Telephone Encounter (Signed)
Home health orders received 03/11/22 for Linda [name] ?Home health initiation orders: Yes.  ?Home health re-certification orders: No. ?Patient last seen by ordering physician for this condition: 01/11/22. Must be less than 90 days for re-certification and less than 30 days prior for initiation. Visit must have been for the condition the orders are being placed.  ?Patient meets criteria for Physician to sign orders: Yes.  ?      Current med list has been attached: No  ?      Orders placed on physicians desk for signature: 03/12/22 (date) ?If patient does not meet criteria for orders to be signed: pt was called to schedule appt. Appt is scheduled for 03/13/22.  ? ? ?

## 2022-03-12 NOTE — Telephone Encounter (Signed)
Yes okay 

## 2022-03-13 ENCOUNTER — Inpatient Hospital Stay: Payer: Medicare Other | Admitting: Family Medicine

## 2022-03-13 DIAGNOSIS — R488 Other symbolic dysfunctions: Secondary | ICD-10-CM | POA: Diagnosis not present

## 2022-03-13 DIAGNOSIS — R2689 Other abnormalities of gait and mobility: Secondary | ICD-10-CM | POA: Diagnosis not present

## 2022-03-13 DIAGNOSIS — R41841 Cognitive communication deficit: Secondary | ICD-10-CM | POA: Diagnosis not present

## 2022-03-13 DIAGNOSIS — R1312 Dysphagia, oropharyngeal phase: Secondary | ICD-10-CM | POA: Diagnosis not present

## 2022-03-13 DIAGNOSIS — M6281 Muscle weakness (generalized): Secondary | ICD-10-CM | POA: Diagnosis not present

## 2022-03-13 DIAGNOSIS — R296 Repeated falls: Secondary | ICD-10-CM | POA: Diagnosis not present

## 2022-03-14 DIAGNOSIS — R2689 Other abnormalities of gait and mobility: Secondary | ICD-10-CM | POA: Diagnosis not present

## 2022-03-14 DIAGNOSIS — R1312 Dysphagia, oropharyngeal phase: Secondary | ICD-10-CM | POA: Diagnosis not present

## 2022-03-14 DIAGNOSIS — R488 Other symbolic dysfunctions: Secondary | ICD-10-CM | POA: Diagnosis not present

## 2022-03-14 DIAGNOSIS — M6281 Muscle weakness (generalized): Secondary | ICD-10-CM | POA: Diagnosis not present

## 2022-03-14 DIAGNOSIS — R41841 Cognitive communication deficit: Secondary | ICD-10-CM | POA: Diagnosis not present

## 2022-03-14 DIAGNOSIS — R296 Repeated falls: Secondary | ICD-10-CM | POA: Diagnosis not present

## 2022-03-18 DIAGNOSIS — R488 Other symbolic dysfunctions: Secondary | ICD-10-CM | POA: Diagnosis not present

## 2022-03-18 DIAGNOSIS — R2689 Other abnormalities of gait and mobility: Secondary | ICD-10-CM | POA: Diagnosis not present

## 2022-03-18 DIAGNOSIS — R41841 Cognitive communication deficit: Secondary | ICD-10-CM | POA: Diagnosis not present

## 2022-03-18 DIAGNOSIS — R296 Repeated falls: Secondary | ICD-10-CM | POA: Diagnosis not present

## 2022-03-18 DIAGNOSIS — M6281 Muscle weakness (generalized): Secondary | ICD-10-CM | POA: Diagnosis not present

## 2022-03-18 DIAGNOSIS — R1312 Dysphagia, oropharyngeal phase: Secondary | ICD-10-CM | POA: Diagnosis not present

## 2022-03-19 ENCOUNTER — Inpatient Hospital Stay: Payer: Medicare Other | Admitting: Family Medicine

## 2022-03-19 DIAGNOSIS — R2689 Other abnormalities of gait and mobility: Secondary | ICD-10-CM | POA: Diagnosis not present

## 2022-03-19 DIAGNOSIS — R488 Other symbolic dysfunctions: Secondary | ICD-10-CM | POA: Diagnosis not present

## 2022-03-19 DIAGNOSIS — M6281 Muscle weakness (generalized): Secondary | ICD-10-CM | POA: Diagnosis not present

## 2022-03-19 DIAGNOSIS — R41841 Cognitive communication deficit: Secondary | ICD-10-CM | POA: Diagnosis not present

## 2022-03-19 DIAGNOSIS — R296 Repeated falls: Secondary | ICD-10-CM | POA: Diagnosis not present

## 2022-03-19 DIAGNOSIS — R1312 Dysphagia, oropharyngeal phase: Secondary | ICD-10-CM | POA: Diagnosis not present

## 2022-03-20 DIAGNOSIS — R2689 Other abnormalities of gait and mobility: Secondary | ICD-10-CM | POA: Diagnosis not present

## 2022-03-20 DIAGNOSIS — R296 Repeated falls: Secondary | ICD-10-CM | POA: Diagnosis not present

## 2022-03-20 DIAGNOSIS — M6281 Muscle weakness (generalized): Secondary | ICD-10-CM | POA: Diagnosis not present

## 2022-03-20 DIAGNOSIS — R41841 Cognitive communication deficit: Secondary | ICD-10-CM | POA: Diagnosis not present

## 2022-03-20 DIAGNOSIS — R488 Other symbolic dysfunctions: Secondary | ICD-10-CM | POA: Diagnosis not present

## 2022-03-20 DIAGNOSIS — R1312 Dysphagia, oropharyngeal phase: Secondary | ICD-10-CM | POA: Diagnosis not present

## 2022-03-21 DIAGNOSIS — R1312 Dysphagia, oropharyngeal phase: Secondary | ICD-10-CM | POA: Diagnosis not present

## 2022-03-21 DIAGNOSIS — R41841 Cognitive communication deficit: Secondary | ICD-10-CM | POA: Diagnosis not present

## 2022-03-21 DIAGNOSIS — R488 Other symbolic dysfunctions: Secondary | ICD-10-CM | POA: Diagnosis not present

## 2022-03-21 DIAGNOSIS — M6281 Muscle weakness (generalized): Secondary | ICD-10-CM | POA: Diagnosis not present

## 2022-03-21 DIAGNOSIS — R2689 Other abnormalities of gait and mobility: Secondary | ICD-10-CM | POA: Diagnosis not present

## 2022-03-21 DIAGNOSIS — R296 Repeated falls: Secondary | ICD-10-CM | POA: Diagnosis not present

## 2022-03-22 DIAGNOSIS — R296 Repeated falls: Secondary | ICD-10-CM | POA: Diagnosis not present

## 2022-03-22 DIAGNOSIS — R1312 Dysphagia, oropharyngeal phase: Secondary | ICD-10-CM | POA: Diagnosis not present

## 2022-03-22 DIAGNOSIS — R2689 Other abnormalities of gait and mobility: Secondary | ICD-10-CM | POA: Diagnosis not present

## 2022-03-22 DIAGNOSIS — M6281 Muscle weakness (generalized): Secondary | ICD-10-CM | POA: Diagnosis not present

## 2022-03-22 DIAGNOSIS — R488 Other symbolic dysfunctions: Secondary | ICD-10-CM | POA: Diagnosis not present

## 2022-03-22 DIAGNOSIS — R41841 Cognitive communication deficit: Secondary | ICD-10-CM | POA: Diagnosis not present

## 2022-03-25 DIAGNOSIS — R2689 Other abnormalities of gait and mobility: Secondary | ICD-10-CM | POA: Diagnosis not present

## 2022-03-25 DIAGNOSIS — R488 Other symbolic dysfunctions: Secondary | ICD-10-CM | POA: Diagnosis not present

## 2022-03-25 DIAGNOSIS — R296 Repeated falls: Secondary | ICD-10-CM | POA: Diagnosis not present

## 2022-03-25 DIAGNOSIS — M6281 Muscle weakness (generalized): Secondary | ICD-10-CM | POA: Diagnosis not present

## 2022-03-25 DIAGNOSIS — R1312 Dysphagia, oropharyngeal phase: Secondary | ICD-10-CM | POA: Diagnosis not present

## 2022-03-25 DIAGNOSIS — R41841 Cognitive communication deficit: Secondary | ICD-10-CM | POA: Diagnosis not present

## 2022-03-26 ENCOUNTER — Emergency Department (HOSPITAL_COMMUNITY): Payer: Medicare Other

## 2022-03-26 ENCOUNTER — Emergency Department (HOSPITAL_COMMUNITY)
Admission: EM | Admit: 2022-03-26 | Discharge: 2022-03-26 | Disposition: A | Discharge status: Home / Self Care | Payer: Medicare Other | Attending: Emergency Medicine | Admitting: Emergency Medicine

## 2022-03-26 ENCOUNTER — Other Ambulatory Visit: Payer: Self-pay

## 2022-03-26 ENCOUNTER — Encounter (HOSPITAL_COMMUNITY): Payer: Self-pay

## 2022-03-26 DIAGNOSIS — M6281 Muscle weakness (generalized): Secondary | ICD-10-CM | POA: Diagnosis not present

## 2022-03-26 DIAGNOSIS — Z7982 Long term (current) use of aspirin: Secondary | ICD-10-CM | POA: Insufficient documentation

## 2022-03-26 DIAGNOSIS — Z79899 Other long term (current) drug therapy: Secondary | ICD-10-CM | POA: Diagnosis not present

## 2022-03-26 DIAGNOSIS — R41841 Cognitive communication deficit: Secondary | ICD-10-CM | POA: Diagnosis not present

## 2022-03-26 DIAGNOSIS — R296 Repeated falls: Secondary | ICD-10-CM | POA: Diagnosis not present

## 2022-03-26 DIAGNOSIS — R488 Other symbolic dysfunctions: Secondary | ICD-10-CM | POA: Diagnosis not present

## 2022-03-26 DIAGNOSIS — R4182 Altered mental status, unspecified: Secondary | ICD-10-CM | POA: Diagnosis not present

## 2022-03-26 DIAGNOSIS — R410 Disorientation, unspecified: Secondary | ICD-10-CM | POA: Diagnosis not present

## 2022-03-26 DIAGNOSIS — R1312 Dysphagia, oropharyngeal phase: Secondary | ICD-10-CM | POA: Diagnosis not present

## 2022-03-26 DIAGNOSIS — R4020 Unspecified coma: Secondary | ICD-10-CM | POA: Diagnosis not present

## 2022-03-26 DIAGNOSIS — R2689 Other abnormalities of gait and mobility: Secondary | ICD-10-CM | POA: Diagnosis not present

## 2022-03-26 LAB — COMPREHENSIVE METABOLIC PANEL
ALT: 9 U/L (ref 0–44)
AST: 18 U/L (ref 15–41)
Albumin: 4 g/dL (ref 3.5–5.0)
Alkaline Phosphatase: 65 U/L (ref 38–126)
Anion gap: 8 (ref 5–15)
BUN: 15 mg/dL (ref 8–23)
CO2: 28 mmol/L (ref 22–32)
Calcium: 8.7 mg/dL — ABNORMAL LOW (ref 8.9–10.3)
Chloride: 106 mmol/L (ref 98–111)
Creatinine, Ser: 0.78 mg/dL (ref 0.44–1.00)
GFR, Estimated: >60 mL/min (ref >60)
Glucose, Bld: 105 mg/dL — ABNORMAL HIGH (ref 70–99)
Potassium: 3.5 mmol/L (ref 3.5–5.1)
Sodium: 142 mmol/L (ref 135–145)
Total Bilirubin: 0.3 mg/dL (ref 0.3–1.2)
Total Protein: 7.3 g/dL (ref 6.5–8.1)

## 2022-03-26 LAB — CBC WITH DIFFERENTIAL/PLATELET
Abs Immature Granulocytes: 0.03 10*3/uL (ref 0.00–0.07)
Basophils Absolute: 0 10*3/uL (ref 0.0–0.1)
Basophils Relative: 1 %
Eosinophils Absolute: 0.1 10*3/uL (ref 0.0–0.5)
Eosinophils Relative: 2 %
HCT: 38.5 % (ref 36.0–46.0)
Hemoglobin: 12.5 g/dL (ref 12.0–15.0)
Immature Granulocytes: 0 %
Lymphocytes Relative: 22 %
Lymphs Abs: 1.6 10*3/uL (ref 0.7–4.0)
MCH: 29.2 pg (ref 26.0–34.0)
MCHC: 32.5 g/dL (ref 30.0–36.0)
MCV: 90 fL (ref 80.0–100.0)
Monocytes Absolute: 0.5 10*3/uL (ref 0.1–1.0)
Monocytes Relative: 7 %
Neutro Abs: 5.1 10*3/uL (ref 1.7–7.7)
Neutrophils Relative %: 68 %
Platelets: 190 10*3/uL (ref 150–400)
RBC: 4.28 MIL/uL (ref 3.87–5.11)
RDW: 12.5 % (ref 11.5–15.5)
WBC: 7.5 10*3/uL (ref 4.0–10.5)
nRBC: 0 % (ref 0.0–0.2)

## 2022-03-26 LAB — URINALYSIS, ROUTINE W REFLEX MICROSCOPIC
Bilirubin Urine: NEGATIVE
Glucose, UA: NEGATIVE mg/dL
Hgb urine dipstick: NEGATIVE
Ketones, ur: NEGATIVE mg/dL
Leukocytes,Ua: NEGATIVE
Nitrite: NEGATIVE
Protein, ur: NEGATIVE mg/dL
Specific Gravity, Urine: 1.008 (ref 1.005–1.030)
pH: 7 (ref 5.0–8.0)

## 2022-03-26 LAB — ETHANOL: Alcohol, Ethyl (B): <10 mg/dL (ref <10)

## 2022-03-26 LAB — LACTIC ACID, PLASMA: Lactic Acid, Venous: 1.1 mmol/L (ref 0.5–1.9)

## 2022-03-26 MED ORDER — SODIUM CHLORIDE 0.9 % IV SOLN: INTRAVENOUS | Status: DC

## 2022-03-26 MED ORDER — SODIUM CHLORIDE 0.9 % IV BOLUS
1000.0000 mL | Freq: Once | INTRAVENOUS | Status: AC
  Administered 2022-03-26: 1000 mL via INTRAVENOUS

## 2022-03-26 NOTE — ED Notes (Signed)
Per TOC, Pt's son will be here in 23mns.  Pt happy to wait in lobby.  ?

## 2022-03-26 NOTE — Progress Notes (Signed)
.  Transition of Care [facility]) - Emergency Department Mini Assessment ? ? ?Patient Details  ?Name: Linda [name] ?MRN: [id number] ?Date of Birth: [birth year] ? ?Transition of Care (TOC) CM/SW Contact:    ?Arlie Solomons Johnette Teigen, LCSW ?Phone Number: ?03/26/2022, 4:51 PM ? ? ?Clinical Narrative: ? ?TOC CSW received consult for transportation needs. CSW spoke with pt's son Ziya Coonrod, who stated he will come pick pt up in 20 mins to transport pt back to ALF. TOC sign off.  ? ?ED Mini Assessment: ?  ? ?  ? ?  ? ?  ? ?  ? ? ? ?Patient Contact and Communications ?  ?  ?  ? ,     ?  ?  ? ?  ?  ?  ? ?Admission diagnosis:  AMS ?Patient Active Problem List  ? Diagnosis Date Noted  ? Thoracic vertebral fracture ([place]) 02/26/2022  ? Fall 02/16/2022  ? Maxillary fracture ([place]) 02/16/2022  ? Right orbital fracture ([place]) 02/16/2022  ? Right ureteral stone 11/30/2021  ? Chronic pain of left knee 10/18/2021  ? Spondylosis of lumbar spine 08/23/2021  ? Acute encephalopathy 11/24/2020  ? Dysphagia 06/15/2020  ? Allergic reaction 02/01/2020  ? Idiopathic angioedema 01/20/2020  ? Dysarthria 01/20/2020  ? Hypotension 10/04/2019  ? COPD (chronic obstructive pulmonary disease) ([place]) 01/21/2018  ? Pulmonary asbestosis ([place]) 01/21/2018  ? Hypomagnesemia 01/02/2018  ? Failure to thrive in adult 01/01/2018  ? Altered mental status 10/21/2017  ? Lumbar radiculitis 02/10/2017  ? Low serum erythropoietin level 10/17/2016  ? RBBB 09/23/2016  ? Primary osteoarthritis of left knee 06/19/2016  ? Debilitated patient 06/06/2016  ? CHF (congestive heart failure) ([place]) 05/30/2016  ? Chronic respiratory failure with hypoxia ([place]) 02/07/2016  ? Restrictive lung disease 02/07/2016  ? Rotator cuff syndrome of right shoulder 01/10/2016  ? Encephalopathy, metabolic 41/63/8453  ? Fall at home 01/01/2016  ? Rhabdomyolysis 01/01/2016  ? Diastolic heart failure ([place]) 10/16/2015  ? CAD-minor 2014 08/16/2015  ? Chest pain with moderate risk for cardiac etiology  08/16/2015  ? Narrowing of intervertebral disc space 07/17/2015  ? Bilateral lower leg pain 01/24/2015  ? 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 Chronic pain syndrome 11/21/2014  ? Anxiety and depression 11/21/2014  ? Hypokalemia 11/21/2014  ? Hyperlipidemia 11/21/2014  ? Chronic pain disorder 11/21/2014  ? Primary osteoarthritis of right knee 10/18/2014  ? Pernicious anemia 08/24/2014  ? Generalized anxiety disorder--with occasional panic attacks.  08/05/2014  ? Recurrent major depression-severe ([place]) 08/05/2014  ? Multifactorial gait disorder 07/26/2014  ? Epistaxis 07/18/2014  ? Acute GI bleeding 07/17/2014  ? Anemia associated with acute blood loss 07/17/2014  ? Syncope 07/17/2014  ? History of pulmonary embolism 07/17/2014  ? GI bleed 07/17/2014  ? Arthritis 05/11/2014  ? DDD (degenerative disc disease) 05/11/2014  ? Fibrositis 05/11/2014  ? Amianthosis ([place]) 05/11/2014  ? [name] ([place]) 05/11/2014  ? Grief 04/28/2014  ? OSA (obstructive sleep apnea) 04/24/2014  ? Chronic diastolic CHF, NYHA class 1 04/24/2014  ? Atelectasis 08/06/2013  ? Acute pulmonary embolism ([place]) 08/04/2013  ? Pleural plaque with presence of asbestos 07/22/2013  ? Polypharmacy 04/26/2013  ? Fibromyalgia syndrome 03/01/2013  ? Insomnia 11/12/2012  ? HTN (hypertension), benign 10/25/2012  ? ?PCP:  Tammi Sou, MD ?Pharmacy:   ?[address] ?[place] [address] ?Phone: [phone] Fax: [phone] ?  ?

## 2022-03-26 NOTE — ED Notes (Signed)
Pt wheeled to lobby.  Verbalized understanding discharge instructions and follow-up. In no acute distress.  ? ?

## 2022-03-26 NOTE — ED Notes (Signed)
Pt seems to be more coherent and answering questions quicker.  Sts she is having pain in back and knees.  Sts she takes oxycodone for pain and she has been taking more than normal d/t pain.  ?

## 2022-03-26 NOTE — ED Triage Notes (Signed)
BIBA  ?Per EMS; Pt coming from [facility] c/o AMS since 8:30am. PT noticed lethargy today and called EMS ?VSS  ?70HR  ?140/70 BP  ?122CBG  ?97% RA ?A&O X4 but slow to respond.  ?

## 2022-03-26 NOTE — ED Notes (Signed)
Patient transported to CT 

## 2022-03-26 NOTE — ED Triage Notes (Signed)
Pt reports she was brought in because she "isn't acting like her normal self."  Denies complaints.  ?

## 2022-03-26 NOTE — ED Notes (Signed)
Facility will not provide transportation and Pt does not have anyone to come get here.  Will discuss consulting TOC for cab voucher w/ EDP.  ?

## 2022-03-26 NOTE — ED Provider Notes (Signed)
?[facility] DEPT ?Provider Note ? ? ?CSN: [id number] ?Arrival date & time: 03/26/22  4010 ? ?  ? ?History ? ?Chief Complaint  ?Patient presents with  ?? Altered Mental Status  ? ? ?Linda [name] is a 76 y.o. female. ? ?HPI ?Patient presents from nursing facility because staff thought she was not acting like her self.  The patient herself denies any complaints, acknowledges ongoing pain after a fall resulting in a spinal fracture.  She is wearing her brace. ? ?  ? ?Home Medications ?Prior to Admission medications   ?Medication Sig Start Date End Date Taking? Authorizing Provider  ?albuterol (VENTOLIN HFA) 108 (90 Base) MCG/ACT inhaler Inhale 1-2 puffs into the lungs every 6 (six) hours as needed for wheezing or shortness of breath. 12/28/20   McGowen, Adrian Blackwater, MD  ?ALPRAZolam Duanne Moron) 1 MG tablet TAKE 1 TABLET THREE TIMES DAILY AS NEEDED FOR ANXIETY. ?Patient taking differently: Take 1 mg by mouth 3 (three) times daily as needed for anxiety. 10/11/21   McGowenAdrian Blackwater, MD  ?aspirin 81 MG tablet Take 81 mg by mouth at bedtime.    [provider]  ?diclofenac Sodium (VOLTAREN) 1 % GEL Apply 2 g topically 4 (four) times daily. 4 times a day to both knees ?Patient taking differently: Apply 2 g topically 4 (four) times daily as needed (knee pain). 05/11/21   Bayard Hugger, NP  ?donepezil (ARICEPT) 10 MG tablet Take 1 tablet (10 mg total) by mouth at bedtime. must have office visit ?Patient not taking: Reported on 02/16/2022 05/16/21   Tammi Sou, MD  ?DULoxetine (CYMBALTA) 30 MG capsule TAKE 1 CAPSULE ONCE DAILY ALONG WITH '60MG'$  CAPSULE FOR TOTAL '90MG'$  DAILY. ?Patient taking differently: Take 30 mg by mouth daily. Take '30mg'$  with '60mg'$  to equal '90mg'$  oral daily in the evening. 01/23/22   McGowen, Adrian Blackwater, MD  ?DULoxetine (CYMBALTA) 60 MG capsule TAKE 1 CAPSULE A DAY TO BE COMBINED WITH '30MG'$  CAPSULE ?Patient taking differently: Take 60 mg by mouth daily. Take '60mg'$  with '30mg'$  to  equal '90mg'$  oral daily in the evening. 01/23/22   McGowen, Adrian Blackwater, MD  ?EPINEPHrine 0.3 mg/0.3 mL IJ SOAJ injection Inject 0.3 mg into the muscle as needed for anaphylaxis. 12/28/20   McGowen, Adrian Blackwater, MD  ?fluconazole (DIFLUCAN) 100 MG tablet TAKE (1) TABLET DAILY AS NEEDED FOR THRUSH ?Patient taking differently: Take 100 mg by mouth daily as needed (thrush symptoms). 12/31/21   McGowen, Adrian Blackwater, MD  ?furosemide (LASIX) 40 MG tablet Take 1 tablet (40 mg total) by mouth daily as needed for fluid. 10/16/21   Lelon Perla, MD  ?ipratropium (ATROVENT) 0.03 % nasal spray Place 2 sprays into both nostrils every 12 (twelve) hours as needed (allergies). 02/17/22   Aline August, MD  ?isosorbide mononitrate (IMDUR) 30 MG 24 hr tablet Take 2 tablets (60 mg total) by mouth daily. TAKE 2 TABLETS IN THE AM AND 1 TABLET IN THE PM. ?Patient taking differently: Take 30-60 mg by mouth See admin instructions. Taking 2 tablets (60 mg) in the AM and 1 tablet  '30mg'$  in the evening 02/17/22   Aline August, MD  ?lamoTRIgine (LAMICTAL) 150 MG tablet TAKE 1 TABLET EACH DAY. 02/28/22   McGowen, Adrian Blackwater, MD  ?methocarbamol (ROBAXIN) 500 MG tablet Take 1 tablet (500 mg total) by mouth every 6 (six) hours as needed for muscle spasms. 02/17/22   Aline August, MD  ?metoprolol succinate (TOPROL-XL) 100 MG 24 hr tablet take  ONE tab daily, take additional 1/2 tab as needed FOR FOR BLOOD PRESSURE > 160/90 ?Patient taking differently: Take 50-100 mg by mouth See admin instructions. take '100mg'$  tab daily, take additional '50mg'$  as needed FOR FOR BLOOD PRESSURE > 160/90 12/12/21   McGowen, Adrian Blackwater, MD  ?Multiple Vitamin (MULTIVITAMIN WITH MINERALS) TABS tablet Take 1 tablet by mouth daily.    [provider]  ?nitroGLYCERIN (NITROSTAT) 0.4 MG SL tablet Place 1 tablet (0.4 mg total) under the tongue every 5 (five) minutes as needed for chest pain (x 3 doses). 10/16/21   Lelon Perla, MD  ?oxybutynin (DITROPAN) 5 MG tablet Take 1 tablet (5  mg total) by mouth 3 (three) times daily. 01/11/22   McGowen, Adrian Blackwater, MD  ?oxyCODONE-acetaminophen (PERCOCET) 10-325 MG tablet Take 1 tablet by mouth every 6 (six) hours as needed for pain. Do Not Fill Before 04/04/2022 03/07/22   Bayard Hugger, NP  ?pantoprazole (PROTONIX) 40 MG tablet TAKE 1 TABLET BY MOUTH TWICE DAILY. ?Patient taking differently: Take 40 mg by mouth 2 (two) times daily. 10/16/21   McGowen, Adrian Blackwater, MD  ?polyvinyl alcohol (LIQUIFILM TEARS) 1.4 % ophthalmic solution Place 1 drop into both eyes as needed for dry eyes.    [provider]  ?rosuvastatin (CRESTOR) 20 MG tablet Take 1 tablet (20 mg total) by mouth every evening. 02/17/22   Aline August, MD  ?traZODone (DESYREL) 50 MG tablet TAKE 2-4 TABLETS AT BEDTIME ?Patient taking differently: Take 150-200 mg by mouth at bedtime. 10/01/21   McGowen, Adrian Blackwater, MD  ?   ? ?Allergies    ?Abilify [aripiprazole], Bactrim [sulfamethoxazole-trimethoprim], and Penicillins   ? ?Review of Systems   ?Review of Systems  ?Constitutional:   ?     Per HPI, otherwise negative  ?HENT:    ?     Per HPI, otherwise negative  ?Respiratory:    ?     Per HPI, otherwise negative  ?Cardiovascular:   ?     Per HPI, otherwise negative  ?Gastrointestinal:  Negative for vomiting.  ?Endocrine:  ?     Negative aside from HPI  ?Genitourinary:   ?     Neg aside from HPI   ?Musculoskeletal:   ?     Per HPI, otherwise negative  ?Skin: Negative.   ?Neurological:  Negative for syncope.  ? ?Physical Exam ?Updated Vital Signs ?BP (!) 193/85   Pulse 65   Temp 98 ?F (36.7 ?C) (Oral)   Resp 18   Ht '5\' 4"'$  (1.626 m)   Wt 64.4 kg   SpO2 95%   BMI 24.37 kg/m?  ?Physical Exam ?Vitals and nursing note reviewed.  ?Constitutional:   ?   General: She is not in acute distress. ?   Appearance: She is well-developed. She is not ill-appearing, toxic-appearing or diaphoretic.  ?HENT:  ?   Head: Normocephalic and atraumatic.  ?Eyes:  ?   Conjunctiva/sclera: Conjunctivae normal.   ?Cardiovascular:  ?   Rate and Rhythm: Normal rate and regular rhythm.  ?Pulmonary:  ?   Effort: Pulmonary effort is normal. No respiratory distress.  ?   Breath sounds: Normal breath sounds. No stridor.  ?Abdominal:  ?   General: There is no distension.  ?Skin: ?   General: Skin is warm and dry.  ?Neurological:  ?   Mental Status: She is alert and oriented to person, place, and time.  ?   Cranial Nerves: No cranial nerve deficit.  ?Psychiatric:     ?  Mood and Affect: Mood normal.  ? ? ?ED Results / Procedures / Treatments   ?Labs ?(all labs ordered are listed, but only abnormal results are displayed) ?Labs Reviewed  ?URINALYSIS, ROUTINE W REFLEX MICROSCOPIC - Abnormal; Notable for the following components:  ?    Result Value  ? Color, Urine STRAW (*)   ? Bacteria, UA RARE (*)   ? All other components within normal limits  ?COMPREHENSIVE METABOLIC PANEL - Abnormal; Notable for the following components:  ? Glucose, Bld 105 (*)   ? Calcium 8.7 (*)   ? All other components within normal limits  ?LACTIC ACID, PLASMA  ?ETHANOL  ?CBC WITH DIFFERENTIAL/PLATELET  ? ? ?EKG ?None ? ?Radiology ?CT HEAD WO CONTRAST ? ?Result Date: 03/26/2022 ?CLINICAL DATA:  Mental status change. EXAM: CT HEAD WITHOUT CONTRAST TECHNIQUE: Contiguous axial images were obtained from the base of the skull through the vertex without intravenous contrast. RADIATION DOSE REDUCTION: This exam was performed according to the departmental dose-optimization program which includes automated exposure control, adjustment of the mA and/or kV according to patient size and/or use of iterative reconstruction technique. COMPARISON:  02/26/2022 FINDINGS: Brain: Stable age related cerebral atrophy, ventriculomegaly and periventricular white matter disease. No extra-axial fluid collections are identified. No CT findings for acute hemispheric infarction or intracranial hemorrhage. No mass lesions. The brainstem and cerebellum are normal. Vascular: Stable vascular  calcifications. No aneurysm hyperdense vessels. Skull: No skull fracture or bone lesions. Sinuses/Orbits: The paranasal sinuses and mastoid air cells are clear. The globes are intact. Other: No scalp lesions scalp hemato

## 2022-03-26 NOTE — Discharge Instructions (Signed)
With today's staff concerned about possible confusion and is very important that you monitor your condition carefully, and in particular discuss your medication regimen with your physician.  Return here for concerning changes in your condition. ?

## 2022-03-26 NOTE — ED Notes (Signed)
Due to Pt being more alert, oxygen removed.  ?

## 2022-03-27 DIAGNOSIS — R1312 Dysphagia, oropharyngeal phase: Secondary | ICD-10-CM | POA: Diagnosis not present

## 2022-03-27 DIAGNOSIS — R2689 Other abnormalities of gait and mobility: Secondary | ICD-10-CM | POA: Diagnosis not present

## 2022-03-27 DIAGNOSIS — R488 Other symbolic dysfunctions: Secondary | ICD-10-CM | POA: Diagnosis not present

## 2022-03-27 DIAGNOSIS — R296 Repeated falls: Secondary | ICD-10-CM | POA: Diagnosis not present

## 2022-03-27 DIAGNOSIS — R41841 Cognitive communication deficit: Secondary | ICD-10-CM | POA: Diagnosis not present

## 2022-03-27 DIAGNOSIS — M6281 Muscle weakness (generalized): Secondary | ICD-10-CM | POA: Diagnosis not present

## 2022-03-28 DIAGNOSIS — M6281 Muscle weakness (generalized): Secondary | ICD-10-CM | POA: Diagnosis not present

## 2022-03-28 DIAGNOSIS — R41841 Cognitive communication deficit: Secondary | ICD-10-CM | POA: Diagnosis not present

## 2022-03-28 DIAGNOSIS — R488 Other symbolic dysfunctions: Secondary | ICD-10-CM | POA: Diagnosis not present

## 2022-03-28 DIAGNOSIS — R2689 Other abnormalities of gait and mobility: Secondary | ICD-10-CM | POA: Diagnosis not present

## 2022-03-28 DIAGNOSIS — R1312 Dysphagia, oropharyngeal phase: Secondary | ICD-10-CM | POA: Diagnosis not present

## 2022-03-28 DIAGNOSIS — R296 Repeated falls: Secondary | ICD-10-CM | POA: Diagnosis not present

## 2022-03-29 DIAGNOSIS — M6281 Muscle weakness (generalized): Secondary | ICD-10-CM | POA: Diagnosis not present

## 2022-03-29 DIAGNOSIS — R2689 Other abnormalities of gait and mobility: Secondary | ICD-10-CM | POA: Diagnosis not present

## 2022-03-29 DIAGNOSIS — R1312 Dysphagia, oropharyngeal phase: Secondary | ICD-10-CM | POA: Diagnosis not present

## 2022-03-29 DIAGNOSIS — R488 Other symbolic dysfunctions: Secondary | ICD-10-CM | POA: Diagnosis not present

## 2022-03-29 DIAGNOSIS — R296 Repeated falls: Secondary | ICD-10-CM | POA: Diagnosis not present

## 2022-03-29 DIAGNOSIS — R41841 Cognitive communication deficit: Secondary | ICD-10-CM | POA: Diagnosis not present

## 2022-04-01 ENCOUNTER — Encounter: Payer: Self-pay | Admitting: Family Medicine

## 2022-04-01 ENCOUNTER — Ambulatory Visit (INDEPENDENT_AMBULATORY_CARE_PROVIDER_SITE_OTHER): Payer: Medicare Other | Admitting: Family Medicine

## 2022-04-01 VITALS — BP 144/82 | HR 69 | Temp 98.0°F | Ht 64.0 in | Wt 142.0 lb

## 2022-04-01 DIAGNOSIS — I2510 Atherosclerotic heart disease of native coronary artery without angina pectoris: Secondary | ICD-10-CM | POA: Diagnosis not present

## 2022-04-01 DIAGNOSIS — R296 Repeated falls: Secondary | ICD-10-CM | POA: Diagnosis not present

## 2022-04-01 DIAGNOSIS — R41841 Cognitive communication deficit: Secondary | ICD-10-CM | POA: Diagnosis not present

## 2022-04-01 DIAGNOSIS — R1312 Dysphagia, oropharyngeal phase: Secondary | ICD-10-CM | POA: Diagnosis not present

## 2022-04-01 DIAGNOSIS — R488 Other symbolic dysfunctions: Secondary | ICD-10-CM | POA: Diagnosis not present

## 2022-04-01 DIAGNOSIS — F339 Major depressive disorder, recurrent, unspecified: Secondary | ICD-10-CM | POA: Diagnosis not present

## 2022-04-01 DIAGNOSIS — R2689 Other abnormalities of gait and mobility: Secondary | ICD-10-CM | POA: Diagnosis not present

## 2022-04-01 DIAGNOSIS — M6281 Muscle weakness (generalized): Secondary | ICD-10-CM | POA: Diagnosis not present

## 2022-04-01 DIAGNOSIS — F411 Generalized anxiety disorder: Secondary | ICD-10-CM | POA: Diagnosis not present

## 2022-04-01 DIAGNOSIS — R4189 Other symptoms and signs involving cognitive functions and awareness: Secondary | ICD-10-CM | POA: Diagnosis not present

## 2022-04-01 DIAGNOSIS — Z79899 Other long term (current) drug therapy: Secondary | ICD-10-CM | POA: Diagnosis not present

## 2022-04-01 MED ORDER — TRAZODONE HCL 50 MG PO TABS: ORAL_TABLET | ORAL | 0 refills | Status: DC

## 2022-04-01 MED ORDER — ALPRAZOLAM 0.5 MG PO TABS: ORAL_TABLET | ORAL | 0 refills | Status: DC

## 2022-04-01 MED ORDER — DULOXETINE HCL 60 MG PO CPEP: 60.0000 mg | ORAL_CAPSULE | Freq: Every day | ORAL | 1 refills | Status: DC

## 2022-04-01 NOTE — Progress Notes (Signed)
OFFICE VISIT ? ?04/01/2022 ? ?CC:  ?Chief Complaint  ?Patient presents with  ? Hospitalization Follow-up  ? ?HPI:   ? ?Patient is a 76 y.o. female who presents accompanied by her daughter Lynelle Smoke for f/u ED visit 03/26/22 for altered mental status. ?Of note she had been in the hospital in the recent past from 3/7 to 3/8, 2023 for altered mental status and a fall.  She sustained an acute T12 vertebral fracture.  TLSO brace and outpt f/u with Dr. Arnoldo Morale in neurosurgery were recommended. ?It was felt her altered mental status was due to polypharmacy--> Xanax, Cymbalta, Lamictal, trazodone, opioids.   ? ?Returned to the ED for 03/26/2022 for altered mental status.  CT head negative for acute changes.  Portable chest x-ray negative for acute changes. ?CBC with differential, c-Met, and UA normal in the ED. ? ?CURRENTLY: ?She is tired but denies any acute symptoms. ?Her daughter is concerned that in the afternoons she often has some periods of cognitive slowing, sometimes to the point of asking questions that make her sound a bit confused.  She does act oversedated at times. ?The 3 of [place] discussed Beth's medications, specifically the issue of interactions that would lead to excessive sedation and poor cognitive function. ?Her pain in her back is ongoing and she continues to require opioid pain medicine. ?She has not arranged neurosurgery follow-up.  Apparently there was confusion about this. ? ?ROS as above, plus--> no fevers, no CP, no SOB, no wheezing, no cough, no dizziness, no HAs, no rashes, no melena/hematochezia.  No polyuria or polydipsia.  No myalgias or arthralgias.  No focal weakness, paresthesias, or tremors.  No acute vision or hearing abnormalities.  No dysuria or unusual/new urinary urgency or frequency.  No recent changes in lower legs. ?No n/v/d or abd pain.  No palpitations.   ? ? ?Past Medical History:  ?Diagnosis Date  ? Acute upper GI bleed 06/2014  ? while pt taking coumadin, plavix, and meloxicam---despite  being told not to take coumadin.  ? Anginal pain ([place])   ? Nonobstructive CAD 2014; however, her cardiologist put her on a statin for this and NOT for hyperlipidemia per pt report.  Atyp CP 08/2017 at card f/u, plan for myoc perf imaging.  ? Anxiety   ? panic attacks  ? Asthma   ? w/ asbestososis   ? BPPV (benign paroxysmal positional vertigo) 12/16/2012  ? Chronic diastolic CHF (congestive heart failure) ([place])   ? dry wt as of 11/06/16 is 168 lbs.  ? Chronic lower back pain   ? COPD (chronic obstructive pulmonary disease) ([place])   ? DDD (degenerative disc disease)   ? lumbar and cervical.   ? Diverticular disease   ? Fibromyalgia   ? Patient states dx was around her late 63s but she had sx's for years prior to this.  ? H/O hiatal hernia   ? History of pneumonia   ? hospitalized 12/2011, 02/2013, and 07/2013 [facility]) for this  ? HTN (hypertension)   ? Renal artery dopplers 04/2013 neg for stenosis.  ? Hypervitaminosis D 09/27/2019  ? over-supplemented.  Stopped vit D and plan recheck 2 mo.  ? Idiopathic angio-edema-urticaria [id number]; 2021  ? Angioedema component was very minimal.  2021->Dr. Bobbitt (allergist) eval.  ? Insomnia   ? Iron deficiency anemia   ? Hematologist in [place], [place] did extensive w/u; no cause found; failed oral supplement;; gets fairly regular (q24mor so) IV iron infusions (Venofer -iron sucrose- 2029mwith procrit.  "for  14 yr I've been getting blood work q month & getting infusions prn" (07/12/2013).  Dr. Marin Olp locally, iron infusions done, EPO deficiency dx'd  ? Kidney stone   ? Migraine syndrome   ? "not as often anymore; used to be ~ q wk" (07/12/2013)  ? Mixed incontinence urge and stress   ? Nephrolithiasis   ? "passed all on my own or they are still in there" (07/12/2013)  ? Neuroleptic induced parkinsonism ([place]) 2018  ? Dr. Carles Collet, neuro, saw her 11/24/17 and recommended d/c of abilify as first step.  D/c'd abilify and pt got complete recovery.  ? Oropharyngeal dysphagia   ? swallowing study speech  path 05/2020. Gastric bx's showed gastritis, h pylori NEG  ? OSA on CPAP   ? prior to move to [place]--had another sleep study 10/2015 w/pulm Dr. Camillo Flaming.  ? Osteoarthritis   ? "severe; progressing fast" (07/12/2013); multiple joints-not surgical candidate for TKR (03/2015).  Triamcinolon knee injections by Dr. Tessa Lerner 12/2017.  ? Pernicious anemia 08/24/2014  ? Pleural plaque with presence of asbestos 07/22/2013  ? Pulmonary embolism ([place]) 07/2013  ? Dx at [facility] with very small peripheral upper lobe pe 07/2013: pt took coumadin for about 8-9 mo  ? Pyelonephritis   ? "several times over the last 30 yr" (07/12/2013)  ? RBBB (right bundle branch block)   ? Recurrent major depression ([place])   ? Recurrent UTI   ? hx of hospitalization for pyelonephritis; started abx prophylaxis 06/2015  ? Syncope   ? Hypotensive; ED visit--Dr. Terrence Dupont did Cath--nonobstructive CAD, EF 55-60%.  In retrospect, suspect pt rx med misuse/polypharmacy  ? Vertebral compression fracture ([facility])   ? Acute T12 on 02/27/22 (fall).  multiple old thoracic  ? ? ?Past Surgical History:  ?Procedure Laterality Date  ? APPENDECTOMY  1960  ? AXILLARY SURGERY Left 1978  ? Multiple "lump" in armpit per pt  ? BIOPSY  06/17/2020  ? Gastric bx->gastritis, h pylori neg.  Procedure: BIOPSY;  Surgeon: Carol Ada, MD;  Location: [facility];  Service: Endoscopy;;  ? CARDIAC CATHETERIZATION  01/2013  ? nonobstructive CAD, EF 55-60%  ? CARDIOVASCULAR STRESS TEST  02/22/2015  ? Low risk myocard perf imaging; wall motion normal, normal EF  ? carotid duplex doppler  10/21/2017  ? R vertebral flow suggestive of possible distal obstruction.  Pt declined further w/u as of 10/29/17 but need to revisit this problem periodically.  ? COCCYX REMOVAL  1972  ? DEXA  06/05/2017  ? T-score -3.1  ? DILATION AND CURETTAGE OF UTERUS  ? 1970's  ? ESOPHAGOGASTRODUODENOSCOPY N/A 07/19/2014  ? Gastritis found + in the setting of supratherapeutic INR, +plavix, + meloxicam.  ? ESOPHAGOGASTRODUODENOSCOPY  (EGD) WITH PROPOFOL N/A 06/17/2020  ? NO stricture or other prob to explain pt's dysphagia, dilation was done anyway.  Gastric bx's-->gastritis, h pylori neg. Procedure: ESOPHAGOGASTRODUODENOSCOPY (EGD) WITH PROPOFOL;  Surgeon: Carol Ada, MD;  Location: [facility];  Service: Endoscopy;  Laterality: N/A;  ? EYE SURGERY Left 2012-2013  ? "injections for ~ 1 yr; don't really know what for" (07/12/2013)  ? HEEL SPUR SURGERY Left 2008  ? kidney stone removal Right   ? KNEE SURGERY  2005  ? LEFT HEART CATHETERIZATION WITH CORONARY ANGIOGRAM N/A 01/30/2013  ? Procedure: LEFT HEART CATHETERIZATION WITH CORONARY ANGIOGRAM;  Surgeon: Clent Demark, MD;  Location: [facility] CATH LAB;  Service: Cardiovascular;  Laterality: N/A;  ? MALONEY DILATION  06/17/2020  ? Procedure: Keturah Shavers;  Surgeon: Carol Ada, MD;  Location: [facility];  Service: Endoscopy;;  ? PLANTAR FASCIA RELEASE Left 2008  ? SPIROMETRY  04/25/2014  ? In hosp for acute asthma/COPD flare: mixed obstructive and restrictive lung disease. The FEV1 is severely reduced at 45% predicted.  FEV1 signif decreased compared to prior spirometry 07/23/13.  ? [place]  ? Right forearm and hand  ? TOTAL ABDOMINAL HYSTERECTOMY  1974  ? TRANSTHORACIC ECHOCARDIOGRAM  01/2013; 04/2014;08/2015; 09/2017  ? 2014--NORMAL.  2015--focal basal septal hypertrophy, EF 55-60%, grade I diast dysfxn, mild LAE.  08/2015 EF 55-60%, nl LV syst fxn, grade I DD, valves wnl. 10/21/17: EF 65-70%, grd I DD, o/w normal. 02/17/22 EF 70-75%, hyperdynamic LV fxn, grd I DD.  ? ? ?Outpatient Medications Prior to Visit  ?Medication Sig Dispense Refill  ? furosemide (LASIX) 40 MG tablet Take 1 tablet (40 mg total) by mouth daily as needed for fluid. 30 tablet 6  ? albuterol (VENTOLIN HFA) 108 (90 Base) MCG/ACT inhaler Inhale 1-2 puffs into the lungs every 6 (six) hours as needed for wheezing or shortness of breath. 18 g 1  ? ALPRAZolam (XANAX) 1 MG tablet TAKE 1 TABLET THREE TIMES DAILY AS  NEEDED FOR ANXIETY. (Patient taking differently: Take 1 mg by mouth 3 (three) times daily as needed for anxiety.) 90 tablet 5  ? aspirin 81 MG tablet Take 81 mg by mouth at bedtime.    ? cyclobenzaprine (

## 2022-04-01 NOTE — Patient Instructions (Signed)
Call [facility] to make a hospital follow up appt: [phone] ?

## 2022-04-02 ENCOUNTER — Telehealth: Payer: Self-pay | Admitting: Family Medicine

## 2022-04-02 DIAGNOSIS — R488 Other symbolic dysfunctions: Secondary | ICD-10-CM | POA: Diagnosis not present

## 2022-04-02 DIAGNOSIS — R296 Repeated falls: Secondary | ICD-10-CM | POA: Diagnosis not present

## 2022-04-02 DIAGNOSIS — R41841 Cognitive communication deficit: Secondary | ICD-10-CM | POA: Diagnosis not present

## 2022-04-02 DIAGNOSIS — R1312 Dysphagia, oropharyngeal phase: Secondary | ICD-10-CM | POA: Diagnosis not present

## 2022-04-02 DIAGNOSIS — M6281 Muscle weakness (generalized): Secondary | ICD-10-CM | POA: Diagnosis not present

## 2022-04-02 DIAGNOSIS — R2689 Other abnormalities of gait and mobility: Secondary | ICD-10-CM | POA: Diagnosis not present

## 2022-04-02 NOTE — Telephone Encounter (Signed)
Please review and advise.

## 2022-04-02 NOTE — Telephone Encounter (Signed)
Pt daughter calling about medication advice ? ? ?lamoTRIgine ?lamoTRIgine (LAMICTAL) 150 MG tablet ? ?The only med that is an antidepressant that they didn't discuss yesterday. ? ? ?Daughter wants to eliminate this med, does this medication work the same as the other meds? ? ?[name] daughter cell: [phone] ? ? ?

## 2022-04-03 DIAGNOSIS — M6281 Muscle weakness (generalized): Secondary | ICD-10-CM | POA: Diagnosis not present

## 2022-04-03 DIAGNOSIS — R488 Other symbolic dysfunctions: Secondary | ICD-10-CM | POA: Diagnosis not present

## 2022-04-03 DIAGNOSIS — R296 Repeated falls: Secondary | ICD-10-CM | POA: Diagnosis not present

## 2022-04-03 DIAGNOSIS — R2689 Other abnormalities of gait and mobility: Secondary | ICD-10-CM | POA: Diagnosis not present

## 2022-04-03 DIAGNOSIS — R1312 Dysphagia, oropharyngeal phase: Secondary | ICD-10-CM | POA: Diagnosis not present

## 2022-04-03 DIAGNOSIS — R41841 Cognitive communication deficit: Secondary | ICD-10-CM | POA: Diagnosis not present

## 2022-04-03 NOTE — Telephone Encounter (Signed)
Pt's daughter advised of recommendations, voiced understanding. ?

## 2022-04-03 NOTE — Telephone Encounter (Signed)
No I don't want to stop this right now.  Works different than cymbalta, less chance that this is causing any side effects. ?I recommend we don't make any further changes other than what we talked about at recent visit. ?We can address further changes once we see how she does with these. -thx ?

## 2022-04-04 DIAGNOSIS — R1312 Dysphagia, oropharyngeal phase: Secondary | ICD-10-CM | POA: Diagnosis not present

## 2022-04-04 DIAGNOSIS — R296 Repeated falls: Secondary | ICD-10-CM | POA: Diagnosis not present

## 2022-04-04 DIAGNOSIS — R2689 Other abnormalities of gait and mobility: Secondary | ICD-10-CM | POA: Diagnosis not present

## 2022-04-04 DIAGNOSIS — R488 Other symbolic dysfunctions: Secondary | ICD-10-CM | POA: Diagnosis not present

## 2022-04-04 DIAGNOSIS — R41841 Cognitive communication deficit: Secondary | ICD-10-CM | POA: Diagnosis not present

## 2022-04-04 DIAGNOSIS — M6281 Muscle weakness (generalized): Secondary | ICD-10-CM | POA: Diagnosis not present

## 2022-04-08 ENCOUNTER — Encounter: Payer: Medicare Other | Admitting: Registered Nurse

## 2022-04-08 DIAGNOSIS — R296 Repeated falls: Secondary | ICD-10-CM | POA: Diagnosis not present

## 2022-04-08 DIAGNOSIS — R41841 Cognitive communication deficit: Secondary | ICD-10-CM | POA: Diagnosis not present

## 2022-04-08 DIAGNOSIS — M6281 Muscle weakness (generalized): Secondary | ICD-10-CM | POA: Diagnosis not present

## 2022-04-08 DIAGNOSIS — R2689 Other abnormalities of gait and mobility: Secondary | ICD-10-CM | POA: Diagnosis not present

## 2022-04-08 DIAGNOSIS — R488 Other symbolic dysfunctions: Secondary | ICD-10-CM | POA: Diagnosis not present

## 2022-04-08 DIAGNOSIS — R1312 Dysphagia, oropharyngeal phase: Secondary | ICD-10-CM | POA: Diagnosis not present

## 2022-04-08 NOTE — Progress Notes (Unsigned)
error 

## 2022-04-09 DIAGNOSIS — R488 Other symbolic dysfunctions: Secondary | ICD-10-CM | POA: Diagnosis not present

## 2022-04-09 DIAGNOSIS — M6281 Muscle weakness (generalized): Secondary | ICD-10-CM | POA: Diagnosis not present

## 2022-04-09 DIAGNOSIS — R296 Repeated falls: Secondary | ICD-10-CM | POA: Diagnosis not present

## 2022-04-09 DIAGNOSIS — R41841 Cognitive communication deficit: Secondary | ICD-10-CM | POA: Diagnosis not present

## 2022-04-09 DIAGNOSIS — R1312 Dysphagia, oropharyngeal phase: Secondary | ICD-10-CM | POA: Diagnosis not present

## 2022-04-09 DIAGNOSIS — R2689 Other abnormalities of gait and mobility: Secondary | ICD-10-CM | POA: Diagnosis not present

## 2022-04-10 DIAGNOSIS — R1312 Dysphagia, oropharyngeal phase: Secondary | ICD-10-CM | POA: Diagnosis not present

## 2022-04-10 DIAGNOSIS — R488 Other symbolic dysfunctions: Secondary | ICD-10-CM | POA: Diagnosis not present

## 2022-04-10 DIAGNOSIS — R2689 Other abnormalities of gait and mobility: Secondary | ICD-10-CM | POA: Diagnosis not present

## 2022-04-10 DIAGNOSIS — M6281 Muscle weakness (generalized): Secondary | ICD-10-CM | POA: Diagnosis not present

## 2022-04-10 DIAGNOSIS — R296 Repeated falls: Secondary | ICD-10-CM | POA: Diagnosis not present

## 2022-04-10 DIAGNOSIS — R41841 Cognitive communication deficit: Secondary | ICD-10-CM | POA: Diagnosis not present

## 2022-04-11 DIAGNOSIS — R41841 Cognitive communication deficit: Secondary | ICD-10-CM | POA: Diagnosis not present

## 2022-04-11 DIAGNOSIS — R2689 Other abnormalities of gait and mobility: Secondary | ICD-10-CM | POA: Diagnosis not present

## 2022-04-11 DIAGNOSIS — R1312 Dysphagia, oropharyngeal phase: Secondary | ICD-10-CM | POA: Diagnosis not present

## 2022-04-11 DIAGNOSIS — R488 Other symbolic dysfunctions: Secondary | ICD-10-CM | POA: Diagnosis not present

## 2022-04-11 DIAGNOSIS — R296 Repeated falls: Secondary | ICD-10-CM | POA: Diagnosis not present

## 2022-04-11 DIAGNOSIS — M6281 Muscle weakness (generalized): Secondary | ICD-10-CM | POA: Diagnosis not present

## 2022-04-12 ENCOUNTER — Telehealth: Payer: Self-pay | Admitting: Cardiology

## 2022-04-12 DIAGNOSIS — R41841 Cognitive communication deficit: Secondary | ICD-10-CM | POA: Diagnosis not present

## 2022-04-12 DIAGNOSIS — R2689 Other abnormalities of gait and mobility: Secondary | ICD-10-CM | POA: Diagnosis not present

## 2022-04-12 DIAGNOSIS — M6281 Muscle weakness (generalized): Secondary | ICD-10-CM | POA: Diagnosis not present

## 2022-04-12 DIAGNOSIS — R1312 Dysphagia, oropharyngeal phase: Secondary | ICD-10-CM | POA: Diagnosis not present

## 2022-04-12 DIAGNOSIS — R296 Repeated falls: Secondary | ICD-10-CM | POA: Diagnosis not present

## 2022-04-12 DIAGNOSIS — R488 Other symbolic dysfunctions: Secondary | ICD-10-CM | POA: Diagnosis not present

## 2022-04-12 NOTE — Telephone Encounter (Signed)
Left message for the pt to call the office for a tele pre op appt 

## 2022-04-12 NOTE — Telephone Encounter (Signed)
Preoperative team, please contact this patient and set up a phone call appointment for further cardiac evaluation.  Thank you for your help. ? ?Jossie Ng. Teasha Murrillo NP-C ? ?  ?04/12/2022, 3:53 PM ?[place] ?[address] ?Office [phone] Fax [phone] ? ?

## 2022-04-12 NOTE — Telephone Encounter (Signed)
? ? ?  Pre-operative Risk Assessment  ?  ?Patient Name: CLOEE DUNWOODY  ?DOB: Jan 13, 1946 ?MRN: [id number]  ? ?  ? ?Request for Surgical Clearance   ? ?Procedure:   Transcutaneous nerves stimulation  ? ?Date of Surgery:  Clearance TBD                              ?   ?Surgeon:  Juan Quam PT  ?Surgeon's Group or Practice Name:  [facility] ?Phone number:  [phone] ?Fax number:  [phone] ?  ?Type of Clearance Requested:   ?- Medical  ?  ?Type of Anesthesia:  None  ?  ?Additional requests/questions:   ? ?Signed, ?[place]   ?04/12/2022, 9:18 AM   ?

## 2022-04-15 DIAGNOSIS — R1312 Dysphagia, oropharyngeal phase: Secondary | ICD-10-CM | POA: Diagnosis not present

## 2022-04-15 DIAGNOSIS — R2689 Other abnormalities of gait and mobility: Secondary | ICD-10-CM | POA: Diagnosis not present

## 2022-04-15 DIAGNOSIS — M6281 Muscle weakness (generalized): Secondary | ICD-10-CM | POA: Diagnosis not present

## 2022-04-15 DIAGNOSIS — R296 Repeated falls: Secondary | ICD-10-CM | POA: Diagnosis not present

## 2022-04-15 DIAGNOSIS — R41841 Cognitive communication deficit: Secondary | ICD-10-CM | POA: Diagnosis not present

## 2022-04-15 DIAGNOSIS — R488 Other symbolic dysfunctions: Secondary | ICD-10-CM | POA: Diagnosis not present

## 2022-04-16 DIAGNOSIS — R2689 Other abnormalities of gait and mobility: Secondary | ICD-10-CM | POA: Diagnosis not present

## 2022-04-16 DIAGNOSIS — R296 Repeated falls: Secondary | ICD-10-CM | POA: Diagnosis not present

## 2022-04-16 DIAGNOSIS — R1312 Dysphagia, oropharyngeal phase: Secondary | ICD-10-CM | POA: Diagnosis not present

## 2022-04-16 DIAGNOSIS — R488 Other symbolic dysfunctions: Secondary | ICD-10-CM | POA: Diagnosis not present

## 2022-04-16 DIAGNOSIS — M6281 Muscle weakness (generalized): Secondary | ICD-10-CM | POA: Diagnosis not present

## 2022-04-16 DIAGNOSIS — R41841 Cognitive communication deficit: Secondary | ICD-10-CM | POA: Diagnosis not present

## 2022-04-16 NOTE — Telephone Encounter (Signed)
Left message x 2 for the pt to call the office for tele pre op appt.  ?

## 2022-04-17 DIAGNOSIS — R488 Other symbolic dysfunctions: Secondary | ICD-10-CM | POA: Diagnosis not present

## 2022-04-17 DIAGNOSIS — R2689 Other abnormalities of gait and mobility: Secondary | ICD-10-CM | POA: Diagnosis not present

## 2022-04-17 DIAGNOSIS — M6281 Muscle weakness (generalized): Secondary | ICD-10-CM | POA: Diagnosis not present

## 2022-04-17 DIAGNOSIS — R296 Repeated falls: Secondary | ICD-10-CM | POA: Diagnosis not present

## 2022-04-17 DIAGNOSIS — R41841 Cognitive communication deficit: Secondary | ICD-10-CM | POA: Diagnosis not present

## 2022-04-17 DIAGNOSIS — R1312 Dysphagia, oropharyngeal phase: Secondary | ICD-10-CM | POA: Diagnosis not present

## 2022-04-18 ENCOUNTER — Other Ambulatory Visit: Payer: Self-pay | Admitting: Family Medicine

## 2022-04-18 DIAGNOSIS — M6281 Muscle weakness (generalized): Secondary | ICD-10-CM | POA: Diagnosis not present

## 2022-04-18 DIAGNOSIS — R41841 Cognitive communication deficit: Secondary | ICD-10-CM | POA: Diagnosis not present

## 2022-04-18 DIAGNOSIS — R2689 Other abnormalities of gait and mobility: Secondary | ICD-10-CM | POA: Diagnosis not present

## 2022-04-18 DIAGNOSIS — R488 Other symbolic dysfunctions: Secondary | ICD-10-CM | POA: Diagnosis not present

## 2022-04-18 DIAGNOSIS — R1312 Dysphagia, oropharyngeal phase: Secondary | ICD-10-CM | POA: Diagnosis not present

## 2022-04-18 DIAGNOSIS — R296 Repeated falls: Secondary | ICD-10-CM | POA: Diagnosis not present

## 2022-04-19 DIAGNOSIS — M6281 Muscle weakness (generalized): Secondary | ICD-10-CM | POA: Diagnosis not present

## 2022-04-19 DIAGNOSIS — R41841 Cognitive communication deficit: Secondary | ICD-10-CM | POA: Diagnosis not present

## 2022-04-19 DIAGNOSIS — R488 Other symbolic dysfunctions: Secondary | ICD-10-CM | POA: Diagnosis not present

## 2022-04-19 DIAGNOSIS — R2689 Other abnormalities of gait and mobility: Secondary | ICD-10-CM | POA: Diagnosis not present

## 2022-04-19 DIAGNOSIS — R1312 Dysphagia, oropharyngeal phase: Secondary | ICD-10-CM | POA: Diagnosis not present

## 2022-04-19 DIAGNOSIS — R296 Repeated falls: Secondary | ICD-10-CM | POA: Diagnosis not present

## 2022-04-22 ENCOUNTER — Other Ambulatory Visit: Payer: Self-pay | Admitting: Family Medicine

## 2022-04-22 DIAGNOSIS — R1312 Dysphagia, oropharyngeal phase: Secondary | ICD-10-CM | POA: Diagnosis not present

## 2022-04-22 DIAGNOSIS — R41841 Cognitive communication deficit: Secondary | ICD-10-CM | POA: Diagnosis not present

## 2022-04-22 DIAGNOSIS — R2689 Other abnormalities of gait and mobility: Secondary | ICD-10-CM | POA: Diagnosis not present

## 2022-04-22 DIAGNOSIS — M6281 Muscle weakness (generalized): Secondary | ICD-10-CM | POA: Diagnosis not present

## 2022-04-22 DIAGNOSIS — R296 Repeated falls: Secondary | ICD-10-CM | POA: Diagnosis not present

## 2022-04-22 DIAGNOSIS — R488 Other symbolic dysfunctions: Secondary | ICD-10-CM | POA: Diagnosis not present

## 2022-04-23 DIAGNOSIS — M6281 Muscle weakness (generalized): Secondary | ICD-10-CM | POA: Diagnosis not present

## 2022-04-23 DIAGNOSIS — R296 Repeated falls: Secondary | ICD-10-CM | POA: Diagnosis not present

## 2022-04-23 DIAGNOSIS — R1312 Dysphagia, oropharyngeal phase: Secondary | ICD-10-CM | POA: Diagnosis not present

## 2022-04-23 DIAGNOSIS — R488 Other symbolic dysfunctions: Secondary | ICD-10-CM | POA: Diagnosis not present

## 2022-04-23 DIAGNOSIS — R41841 Cognitive communication deficit: Secondary | ICD-10-CM | POA: Diagnosis not present

## 2022-04-23 DIAGNOSIS — R2689 Other abnormalities of gait and mobility: Secondary | ICD-10-CM | POA: Diagnosis not present

## 2022-04-24 DIAGNOSIS — R2689 Other abnormalities of gait and mobility: Secondary | ICD-10-CM | POA: Diagnosis not present

## 2022-04-24 DIAGNOSIS — R1312 Dysphagia, oropharyngeal phase: Secondary | ICD-10-CM | POA: Diagnosis not present

## 2022-04-24 DIAGNOSIS — R41841 Cognitive communication deficit: Secondary | ICD-10-CM | POA: Diagnosis not present

## 2022-04-24 DIAGNOSIS — M6281 Muscle weakness (generalized): Secondary | ICD-10-CM | POA: Diagnosis not present

## 2022-04-24 DIAGNOSIS — R488 Other symbolic dysfunctions: Secondary | ICD-10-CM | POA: Diagnosis not present

## 2022-04-24 DIAGNOSIS — R296 Repeated falls: Secondary | ICD-10-CM | POA: Diagnosis not present

## 2022-04-25 DIAGNOSIS — R1312 Dysphagia, oropharyngeal phase: Secondary | ICD-10-CM | POA: Diagnosis not present

## 2022-04-25 DIAGNOSIS — R41841 Cognitive communication deficit: Secondary | ICD-10-CM | POA: Diagnosis not present

## 2022-04-25 DIAGNOSIS — R488 Other symbolic dysfunctions: Secondary | ICD-10-CM | POA: Diagnosis not present

## 2022-04-25 DIAGNOSIS — M6281 Muscle weakness (generalized): Secondary | ICD-10-CM | POA: Diagnosis not present

## 2022-04-25 DIAGNOSIS — R2689 Other abnormalities of gait and mobility: Secondary | ICD-10-CM | POA: Diagnosis not present

## 2022-04-25 DIAGNOSIS — R296 Repeated falls: Secondary | ICD-10-CM | POA: Diagnosis not present

## 2022-04-25 NOTE — Telephone Encounter (Signed)
Left message on patient and son's voicemail advising the patient to contact the office.  ?

## 2022-04-26 ENCOUNTER — Telehealth: Payer: Self-pay | Admitting: Cardiology

## 2022-04-26 NOTE — Telephone Encounter (Signed)
Attempted to contact Linda [name]- number was a busy signal.  ? ?I have noted on the clearance patient was not aware of anything being completed.  ?

## 2022-04-26 NOTE — Telephone Encounter (Signed)
Spoke with the patient who states she si not aware of any procedure for a nerve stimulator. She states she saw Gwenlyn Perking yesterday and he did not mention anything to her aboout it. I advised her to contact Gwenlyn Perking and discuss with him the procedure and if she wants to complete the procedure contact our office to schedule a telehealth appointment. The patient voiced understanding.  ?

## 2022-04-26 NOTE — Telephone Encounter (Signed)
Linda [name] states that he is pt's Physical Therapist who is calling to f/u on Medical Clearance that he sent over. Please advise ?

## 2022-04-29 DIAGNOSIS — M6281 Muscle weakness (generalized): Secondary | ICD-10-CM | POA: Diagnosis not present

## 2022-04-29 DIAGNOSIS — R41841 Cognitive communication deficit: Secondary | ICD-10-CM | POA: Diagnosis not present

## 2022-04-29 DIAGNOSIS — Z20822 Contact with and (suspected) exposure to covid-19: Secondary | ICD-10-CM | POA: Diagnosis not present

## 2022-04-29 DIAGNOSIS — R296 Repeated falls: Secondary | ICD-10-CM | POA: Diagnosis not present

## 2022-04-29 DIAGNOSIS — R1312 Dysphagia, oropharyngeal phase: Secondary | ICD-10-CM | POA: Diagnosis not present

## 2022-04-29 DIAGNOSIS — R2689 Other abnormalities of gait and mobility: Secondary | ICD-10-CM | POA: Diagnosis not present

## 2022-04-29 DIAGNOSIS — R488 Other symbolic dysfunctions: Secondary | ICD-10-CM | POA: Diagnosis not present

## 2022-04-30 DIAGNOSIS — R488 Other symbolic dysfunctions: Secondary | ICD-10-CM | POA: Diagnosis not present

## 2022-04-30 DIAGNOSIS — R1312 Dysphagia, oropharyngeal phase: Secondary | ICD-10-CM | POA: Diagnosis not present

## 2022-04-30 DIAGNOSIS — R2689 Other abnormalities of gait and mobility: Secondary | ICD-10-CM | POA: Diagnosis not present

## 2022-04-30 DIAGNOSIS — M6281 Muscle weakness (generalized): Secondary | ICD-10-CM | POA: Diagnosis not present

## 2022-04-30 DIAGNOSIS — R296 Repeated falls: Secondary | ICD-10-CM | POA: Diagnosis not present

## 2022-04-30 DIAGNOSIS — R41841 Cognitive communication deficit: Secondary | ICD-10-CM | POA: Diagnosis not present

## 2022-05-01 ENCOUNTER — Telehealth: Payer: Self-pay | Admitting: Cardiology

## 2022-05-01 DIAGNOSIS — M6281 Muscle weakness (generalized): Secondary | ICD-10-CM | POA: Diagnosis not present

## 2022-05-01 DIAGNOSIS — R296 Repeated falls: Secondary | ICD-10-CM | POA: Diagnosis not present

## 2022-05-01 DIAGNOSIS — R41841 Cognitive communication deficit: Secondary | ICD-10-CM | POA: Diagnosis not present

## 2022-05-01 DIAGNOSIS — R2689 Other abnormalities of gait and mobility: Secondary | ICD-10-CM | POA: Diagnosis not present

## 2022-05-01 DIAGNOSIS — R1312 Dysphagia, oropharyngeal phase: Secondary | ICD-10-CM | POA: Diagnosis not present

## 2022-05-01 DIAGNOSIS — R488 Other symbolic dysfunctions: Secondary | ICD-10-CM | POA: Diagnosis not present

## 2022-05-01 NOTE — Telephone Encounter (Signed)
Patient states her physical therapist wants to know if it's okay for her to use a tens unit on her knees.   ?

## 2022-05-01 NOTE — Telephone Encounter (Signed)
Left message for patient with Dr Creshaw's recommendations.   

## 2022-05-01 NOTE — Telephone Encounter (Signed)
Patient lives at [facility]. Her physical therapist Gwenlyn Perking wants to know if it is okay to use TENS unit on patient's knees. Please advise. ?

## 2022-05-02 ENCOUNTER — Telehealth: Payer: Self-pay

## 2022-05-02 DIAGNOSIS — R1312 Dysphagia, oropharyngeal phase: Secondary | ICD-10-CM | POA: Diagnosis not present

## 2022-05-02 DIAGNOSIS — R41841 Cognitive communication deficit: Secondary | ICD-10-CM | POA: Diagnosis not present

## 2022-05-02 DIAGNOSIS — M6281 Muscle weakness (generalized): Secondary | ICD-10-CM | POA: Diagnosis not present

## 2022-05-02 DIAGNOSIS — R488 Other symbolic dysfunctions: Secondary | ICD-10-CM | POA: Diagnosis not present

## 2022-05-02 DIAGNOSIS — R296 Repeated falls: Secondary | ICD-10-CM | POA: Diagnosis not present

## 2022-05-02 DIAGNOSIS — R2689 Other abnormalities of gait and mobility: Secondary | ICD-10-CM | POA: Diagnosis not present

## 2022-05-02 NOTE — Telephone Encounter (Signed)
Received therapy evaluation/updated POC for speech/language/cognitive therapy. Pt's next upcoming appt is 5/16 for follow up.  ? ?Placed on PCP desk to review and sign, if appropriate. ? ?

## 2022-05-05 ENCOUNTER — Other Ambulatory Visit: Payer: Self-pay | Admitting: Family Medicine

## 2022-05-06 ENCOUNTER — Telehealth: Payer: Self-pay

## 2022-05-06 DIAGNOSIS — R1312 Dysphagia, oropharyngeal phase: Secondary | ICD-10-CM | POA: Diagnosis not present

## 2022-05-06 DIAGNOSIS — R2689 Other abnormalities of gait and mobility: Secondary | ICD-10-CM | POA: Diagnosis not present

## 2022-05-06 DIAGNOSIS — M6281 Muscle weakness (generalized): Secondary | ICD-10-CM | POA: Diagnosis not present

## 2022-05-06 DIAGNOSIS — R296 Repeated falls: Secondary | ICD-10-CM | POA: Diagnosis not present

## 2022-05-06 DIAGNOSIS — R41841 Cognitive communication deficit: Secondary | ICD-10-CM | POA: Diagnosis not present

## 2022-05-06 DIAGNOSIS — R488 Other symbolic dysfunctions: Secondary | ICD-10-CM | POA: Diagnosis not present

## 2022-05-06 NOTE — Telephone Encounter (Signed)
Linda [name] missed her last follow appointment. She does have one to see Dr. Naaman Plummer on 05/22/2022, for an injection. Linda [name] wanted to know how soon you will need to see her back? So she will remain in good standing for her pain medication refills.  ? ?Call back phone [phone] ? ?(Patient is aware Linda Sensing NP is not in the office today). ?

## 2022-05-06 NOTE — Telephone Encounter (Signed)
She phone note from 05/01/22. ?

## 2022-05-07 ENCOUNTER — Other Ambulatory Visit: Payer: Self-pay | Admitting: Family Medicine

## 2022-05-07 ENCOUNTER — Ambulatory Visit: Payer: Medicare Other | Admitting: Family Medicine

## 2022-05-08 DIAGNOSIS — R488 Other symbolic dysfunctions: Secondary | ICD-10-CM | POA: Diagnosis not present

## 2022-05-08 DIAGNOSIS — R1312 Dysphagia, oropharyngeal phase: Secondary | ICD-10-CM | POA: Diagnosis not present

## 2022-05-08 DIAGNOSIS — R296 Repeated falls: Secondary | ICD-10-CM | POA: Diagnosis not present

## 2022-05-08 DIAGNOSIS — R41841 Cognitive communication deficit: Secondary | ICD-10-CM | POA: Diagnosis not present

## 2022-05-08 DIAGNOSIS — R2689 Other abnormalities of gait and mobility: Secondary | ICD-10-CM | POA: Diagnosis not present

## 2022-05-08 DIAGNOSIS — M6281 Muscle weakness (generalized): Secondary | ICD-10-CM | POA: Diagnosis not present

## 2022-05-08 NOTE — Telephone Encounter (Signed)
PMP was Reviewed.  ?[name] sent in the prescription on 05/06/2022.  ?

## 2022-05-09 DIAGNOSIS — R2689 Other abnormalities of gait and mobility: Secondary | ICD-10-CM | POA: Diagnosis not present

## 2022-05-09 DIAGNOSIS — R296 Repeated falls: Secondary | ICD-10-CM | POA: Diagnosis not present

## 2022-05-09 DIAGNOSIS — R41841 Cognitive communication deficit: Secondary | ICD-10-CM | POA: Diagnosis not present

## 2022-05-09 DIAGNOSIS — R488 Other symbolic dysfunctions: Secondary | ICD-10-CM | POA: Diagnosis not present

## 2022-05-09 DIAGNOSIS — R1312 Dysphagia, oropharyngeal phase: Secondary | ICD-10-CM | POA: Diagnosis not present

## 2022-05-09 DIAGNOSIS — M6281 Muscle weakness (generalized): Secondary | ICD-10-CM | POA: Diagnosis not present

## 2022-05-16 ENCOUNTER — Telehealth: Payer: Self-pay | Admitting: *Deleted

## 2022-05-16 NOTE — Telephone Encounter (Signed)
Instructed Linda [name] to call and clarify if the zilretta will be a problem the next day.  She says she has already started the platelets 6 weeks ago. Tuesday is to be her 2nd injection.

## 2022-05-16 NOTE — Telephone Encounter (Signed)
Ms Notaro is starting [facility] on Tuesday 05/21/22 and will be getting injections (platelets?). She has appt with you on Wednesday 05/22/22 for Zilretta injections. She is in a lot of pain right now and can barely walk. She is wondering what she should do--change the appt with [facility] or what do you suggest?

## 2022-05-17 ENCOUNTER — Ambulatory Visit: Payer: Medicare Other | Admitting: Registered Nurse

## 2022-05-22 ENCOUNTER — Encounter: Payer: Self-pay | Admitting: Physical Medicine & Rehabilitation

## 2022-05-22 ENCOUNTER — Encounter: Payer: Medicare Other | Attending: Physical Medicine & Rehabilitation | Admitting: Physical Medicine & Rehabilitation

## 2022-05-22 VITALS — BP 99/57 | HR 74 | Ht 64.0 in | Wt 138.0 lb

## 2022-05-22 DIAGNOSIS — M1712 Unilateral primary osteoarthritis, left knee: Secondary | ICD-10-CM | POA: Insufficient documentation

## 2022-05-22 DIAGNOSIS — M170 Bilateral primary osteoarthritis of knee: Secondary | ICD-10-CM

## 2022-05-22 DIAGNOSIS — M1711 Unilateral primary osteoarthritis, right knee: Secondary | ICD-10-CM | POA: Insufficient documentation

## 2022-05-22 MED ORDER — OXYCODONE-ACETAMINOPHEN 10-325 MG PO TABS: 1.0000 | ORAL_TABLET | Freq: Four times a day (QID) | ORAL | 0 refills | Status: DC | PRN

## 2022-05-22 NOTE — Patient Instructions (Signed)
PLEASE FEEL FREE TO CALL OUR OFFICE WITH ANY PROBLEMS OR QUESTIONS ([phone])      

## 2022-05-22 NOTE — Telephone Encounter (Signed)
She reports that they said there was no conflict as far as they were concerned with Zilretta and the platelet therapy. [name] notified.

## 2022-05-22 NOTE — Progress Notes (Signed)
PROCEDURE NOTE  DIAGNOSIS:  Bilateral OA of knees  INTERVENTION:   ZILRETTA INJECTION     After informed consent and preparation of the skin with betadine and isopropyl alcohol, I injected '32MG'$  of zilretta dissolved into 5cc of diluent into bilateral knees via anterolateral approach. Contents of syring were shaken vigorously and aspiration was performed prior to injection. The patient tolerated well, and no complications were encountered. Afterward the area was cleaned and dressed. Post- injection instructions were provided including ice  if swelling or pain should occur.    Meredith Staggers, MD, [facility] 05/22/2022

## 2022-05-23 ENCOUNTER — Ambulatory Visit (INDEPENDENT_AMBULATORY_CARE_PROVIDER_SITE_OTHER): Payer: Medicare Other | Admitting: Family Medicine

## 2022-05-23 ENCOUNTER — Encounter: Payer: Self-pay | Admitting: Family Medicine

## 2022-05-23 VITALS — BP 150/78 | HR 75 | Temp 98.7°F | Ht 64.0 in | Wt 137.2 lb

## 2022-05-23 DIAGNOSIS — F330 Major depressive disorder, recurrent, mild: Secondary | ICD-10-CM | POA: Diagnosis not present

## 2022-05-23 DIAGNOSIS — I10 Essential (primary) hypertension: Secondary | ICD-10-CM | POA: Diagnosis not present

## 2022-05-23 DIAGNOSIS — G2581 Restless legs syndrome: Secondary | ICD-10-CM

## 2022-05-23 DIAGNOSIS — I2510 Atherosclerotic heart disease of native coronary artery without angina pectoris: Secondary | ICD-10-CM | POA: Diagnosis not present

## 2022-05-23 DIAGNOSIS — F411 Generalized anxiety disorder: Secondary | ICD-10-CM | POA: Diagnosis not present

## 2022-05-23 DIAGNOSIS — Z79899 Other long term (current) drug therapy: Secondary | ICD-10-CM | POA: Diagnosis not present

## 2022-05-23 MED ORDER — OXYBUTYNIN CHLORIDE 5 MG PO TABS: 5.0000 mg | ORAL_TABLET | Freq: Three times a day (TID) | ORAL | 1 refills | Status: DC

## 2022-05-23 MED ORDER — TRAZODONE HCL 50 MG PO TABS: ORAL_TABLET | ORAL | 5 refills | Status: DC

## 2022-05-23 MED ORDER — DONEPEZIL HCL 10 MG PO TABS: ORAL_TABLET | ORAL | 1 refills | Status: DC

## 2022-05-23 MED ORDER — ALPRAZOLAM 0.5 MG PO TABS
ORAL_TABLET | ORAL | 0 refills | Status: DC
Start: 2022-05-23 — End: 2022-07-03

## 2022-05-23 MED ORDER — METOPROLOL SUCCINATE ER 100 MG PO TB24: ORAL_TABLET | ORAL | 1 refills | Status: DC

## 2022-05-23 MED ORDER — ROPINIROLE HCL 0.25 MG PO TABS
0.2500 mg | ORAL_TABLET | Freq: Every day | ORAL | 0 refills | Status: DC
Start: 2022-05-23 — End: 2022-06-06

## 2022-05-23 MED ORDER — LAMOTRIGINE 25 MG PO TABS: 25.0000 mg | ORAL_TABLET | Freq: Every day | ORAL | 0 refills | Status: DC

## 2022-05-23 MED ORDER — DULOXETINE HCL 60 MG PO CPEP: 60.0000 mg | ORAL_CAPSULE | Freq: Every day | ORAL | 1 refills | Status: DC

## 2022-05-23 MED ORDER — PANTOPRAZOLE SODIUM 40 MG PO TBEC: 40.0000 mg | DELAYED_RELEASE_TABLET | Freq: Two times a day (BID) | ORAL | 1 refills | Status: DC

## 2022-05-23 NOTE — Progress Notes (Signed)
OFFICE VISIT  05/23/2022  CC: f/u anx/dep,htn, polypharm, f/u T12 comp fx  Patient is a 76 y.o. female who presents for 7-week follow-up A/P as of last visit: "#1 polypharmacy. Suspect this is led to her cognitive slowing and periodic oversedation. This is likely been the chief contributor to her recurrent falls and the resulting compression fracture. We have struggled a long time with the balance of therapeutic effect of these meds and the known potential drawbacks/side effects. She agreed to some cutbacks slowly in order to try to get improved cognitive function, less sedation, and more independence. Hopefully as we do this slowly she will tolerate it from a standpoint of anxiety and depression and insomnia. Changes made today: Do not take Flexeril (she says she has not been taking this much at all anyway).  Cut Xanax dose to 0.5 mg 3 times daily. Decrease Cymbalta from 90 mg a day to 60 mg a day. Take a maximum of 100 mg trazodone at bedtime.   I gave her [facility] contact number so she can call and arrange for hospital follow-up appointment for her recent T12 compression fracture. Continue with TLSO brace. Pain medications per Dr. Tessa Lerner."  INTERIM HX: She is doing fine regarding her back.  She does have a neurosurgery appointment but does not know what it is. She is wearing a back brace.  Severe knee pain is from ongoing biggest difficulty. She just recently started stem cell injections via [facility] in [place].  She tells me her daughter threw away her Lamictal after I saw her last.  Eustaquio Maize does feel an increase and daytime anxiety and mild increase in depressed mood. She does seem more alert.  No recent falls.  A new symptom for her in the last few weeks is restlessness of her legs prior to going to sleep.  Feels a tingly sensation all over them from the knees down and irresistible urge to move them.  Not bothering her in the daytime up until  today.   ROS as above, plus--> no fevers, no CP, no SOB, no wheezing, no cough, no dizziness, no HAs, no rashes, no melena/hematochezia.  No polyuria or polydipsia.  No myalgias or arthralgias.  No focal weakness, paresthesias, or tremors.  No acute vision or hearing abnormalities.  No dysuria or unusual/new urinary urgency or frequency.  No recent changes in lower legs. No n/v/d or abd pain.  No palpitations.    PMP AWARE reviewed today: most recent rx for alprazolam 0.'5mg'$  was filled 05/06/22, # [phone], rx by me. No red flags.   Past Medical History:  Diagnosis Date   Acute upper GI bleed 06/2014   while pt taking coumadin, plavix, and meloxicam---despite being told not to take coumadin.   Anginal pain ([place])    Nonobstructive CAD 2014; however, her cardiologist put her on a statin for this and NOT for hyperlipidemia per pt report.  Atyp CP 08/2017 at card f/u, plan for myoc perf imaging.   Anxiety    panic attacks   Asthma    w/ asbestososis    BPPV (benign paroxysmal positional vertigo) 12/16/2012   Chronic diastolic CHF (congestive heart failure) (HCC)    dry wt as of 11/06/16 is 168 lbs.   Chronic lower back pain    COPD (chronic obstructive pulmonary disease) (HCC)    DDD (degenerative disc disease)    lumbar and cervical.    Diverticular disease    Fibromyalgia    Patient states dx was  around her late 102s but she had sx's for years prior to this.   H/O hiatal hernia    History of pneumonia    hospitalized 12/2011, 02/2013, and 07/2013 [facility]) for this   HTN (hypertension)    Renal artery dopplers 04/2013 neg for stenosis.   Hypervitaminosis D 09/27/2019   over-supplemented.  Stopped vit D and plan recheck 2 mo.   Idiopathic angio-edema-urticaria 72014; 2021   Angioedema component was very minimal.  2021->Dr. Bobbitt (allergist) eval.   Insomnia    Iron deficiency anemia    Hematologist in [place], [place] did extensive w/u; no cause found; failed oral supplement;; gets fairly  regular (q41mor so) IV iron infusions (Venofer -iron sucrose- '200mg'$  with procrit.  "for 14 yr I've been getting blood work q month & getting infusions prn" (07/12/2013).  Dr. EMarin Olplocally, iron infusions done, EPO deficiency dx'd   Kidney stone    Migraine syndrome    "not as often anymore; used to be ~ q wk" (07/12/2013)   Mixed incontinence urge and stress    Nephrolithiasis    "passed all on my own or they are still in there" (07/12/2013)   Neuroleptic induced parkinsonism (HCC) 2018   Dr. TCarles Collet neuro, saw her 11/24/17 and recommended d/c of abilify as first step.  D/c'd abilify and pt got complete recovery.   Oropharyngeal dysphagia    swallowing study speech path 05/2020. Gastric bx's showed gastritis, h pylori NEG   OSA on CPAP    prior to move to [place]--had another sleep study 10/2015 w/pulm Dr. ECamillo Flaming   Osteoarthritis    "severe; progressing fast" (07/12/2013); multiple joints-not surgical candidate for TKR (03/2015).  Triamcinolon knee injections by Dr. STessa Lerner1/2019.   Pernicious anemia 08/24/2014   Pleural plaque with presence of asbestos 07/22/2013   [facility] embol[facility] 07/2013   [facility]: pt took coumadin for about 8-9 mo   Pyelonephritis    "several times over the last 30 yr" (07/12/2013)   RBBB (right bundle branch block)    Recurrent major depression ([place]    Recurrent UTI    hx of hospitalization for pyelonephritis; started abx prophylaxis 06/2015   Syncope    Hypotensive; ED visit--Dr. HTerrence Dupontdid Cath--nonobstructive CAD, EF 55-60%.  In retrospect, suspect pt rx med misuse/polypharmacy   [place]    Acute T12 on 02/27/22 (fall).  multiple old thoracic    Past Surgical History:  Procedure Laterality Date   APPENDECTOMY  1960   AXILLARY SURGERY Left 1978   Multiple "lump" in armpit per pt   BIOPSY  06/17/2020   Gastric bx->gastritis, h pylori neg.  Procedure: BIOPSY;  Surgeon: HCarol Ada  MD;  Location: [facility];  Service: Endoscopy;;   CARDIAC CATHETERIZATION  01/2013   nonobstructive CAD, EF 55-60%   CARDIOVASCULAR STRESS TEST  02/22/2015   Low risk myocard perf imaging; wall motion normal, normal EF   carotid duplex doppler  10/21/2017   R vertebral flow suggestive of possible distal obstruction.  Pt declined further w/u as of 10/29/17 but need to revisit this problem periodically.   COCCYX REMOVAL  1972   DEXA  06/05/2017   T-score -3.1   DILATION AND CURETTAGE OF UTERUS  ? 1970's   ESOPHAGOGASTRODUODENOSCOPY N/A 07/19/2014   Gastritis found + in the setting of supratherapeutic INR, +plavix, + meloxicam.   ESOPHAGOGASTRODUODENOSCOPY (EGD) WITH PROPOFOL N/A 06/17/2020   NO stricture or other prob  to explain pt's dysphagia, dilation was done anyway.  Gastric bx's-->gastritis, h pylori neg. Procedure: ESOPHAGOGASTRODUODENOSCOPY (EGD) WITH PROPOFOL;  Surgeon: Carol Ada, MD;  Location: [facility];  Service: Endoscopy;  Laterality: N/A;   EYE SURGERY Left 2012-2013   "injections for ~ 1 yr; don't really know what for" (07/12/2013)   HEEL SPUR SURGERY Left 2008   kidney stone removal Right    KNEE SURGERY  2005   LEFT HEART CATHETERIZATION WITH CORONARY ANGIOGRAM N/A 01/30/2013   Procedure: LEFT HEART CATHETERIZATION WITH CORONARY ANGIOGRAM;  Surgeon: Clent Demark, MD;  Location: [facility];  Service: Cardiovascular;  Laterality: N/A;   MALONEY DILATION  06/17/2020   Procedure: Venia Minks DILATION;  Surgeon: Carol Ada, MD;  Location: [facility];  Service: Endoscopy;;   PLANTAR FASCIA RELEASE Left 2008   SPIROMETRY  04/25/2014   In hosp for acute asthma/COPD flare: mixed obstructive and restrictive lung disease. The FEV1 is severely reduced at 45% predicted.  FEV1 signif decreased compared to prior spirometry 07/23/13.   TENDON RELEASE  1996   Right forearm and hand   TOTAL ABDOMINAL HYSTERECTOMY  1974   TRANSTHORACIC ECHOCARDIOGRAM  01/2013; 04/2014;08/2015;  09/2017   2014--NORMAL.  2015--focal basal septal hypertrophy, EF 55-60%, grade I diast dysfxn, mild LAE.  08/2015 EF 55-60%, nl LV syst fxn, grade I DD, valves wnl. 10/21/17: EF 65-70%, grd I DD, o/w normal. 02/17/22 EF 70-75%, hyperdynamic LV fxn, grd I DD.    Outpatient Medications Prior to Visit  Medication Sig Dispense Refill   albuterol (VENTOLIN HFA) 108 (90 Base) MCG/ACT inhaler Inhale 1-2 puffs into the lungs every 6 (six) hours as needed for wheezing or shortness of breath. 18 g 1   ALPRAZolam (XANAX) 0.5 MG tablet TAKE ONE TABLET BY MOUTH THREE TIMES DAILY. 90 tablet 0   aspirin 81 MG tablet Take 81 mg by mouth at bedtime.     cyclobenzaprine (FLEXERIL) 5 MG tablet Take 5 mg by mouth 2 (two) times daily as needed.     diclofenac Sodium (VOLTAREN) 1 % GEL Apply 2 g topically 4 (four) times daily. 4 times a day to both knees (Patient taking differently: Apply 2 g topically 4 (four) times daily as needed (knee pain).) 300 g 4   donepezil (ARICEPT) 10 MG tablet Take 1 tablet (10 mg total) by mouth at bedtime. must have office visit (Patient not taking: Reported on 02/16/2022) 90 tablet 3   DULoxetine (CYMBALTA) 60 MG capsule Take 1 capsule (60 mg total) by mouth daily. Take '60mg'$  with '30mg'$  to equal '90mg'$  oral daily in the evening. 90 capsule 1   EPINEPHrine 0.3 mg/0.3 mL IJ SOAJ injection Inject 0.3 mg into the muscle as needed for anaphylaxis. (Patient not taking: Reported on 05/23/2022) 1 each 1   fluconazole (DIFLUCAN) 100 MG tablet TAKE (1) TABLET DAILY AS NEEDED FOR THRUSH (Patient taking differently: Take 100 mg by mouth daily as needed (thrush symptoms).) 15 tablet 1   furosemide (LASIX) 40 MG tablet Take 1 tablet (40 mg total) by mouth daily as needed for fluid. 30 tablet 6   ipratropium (ATROVENT) 0.03 % nasal spray Place 2 sprays into both nostrils every 12 (twelve) hours as needed (allergies).     isosorbide mononitrate (IMDUR) 30 MG 24 hr tablet Take 2 tablets (60 mg total) by mouth  daily. TAKE 2 TABLETS IN THE AM AND 1 TABLET IN THE PM. (Patient taking differently: Take 30-60 mg by mouth See admin instructions. Taking 2 tablets (60  mg) in the AM and 1 tablet  '30mg'$  in the evening)     lamoTRIgine (LAMICTAL) 150 MG tablet TAKE 1 TABLET EACH DAY. 90 tablet 0   methocarbamol (ROBAXIN) 500 MG tablet Take 1 tablet (500 mg total) by mouth every 6 (six) hours as needed for muscle spasms. 30 tablet 0   metoprolol succinate (TOPROL-XL) 100 MG 24 hr tablet take ONE tab daily, take additional 1/2 tab as needed FOR FOR BLOOD PRESSURE > 160/90 (Patient taking differently: Take 50-100 mg by mouth See admin instructions. take '100mg'$  tab daily, take additional '50mg'$  as needed FOR FOR BLOOD PRESSURE > 160/90) 135 tablet 0   Multiple Vitamin (MULTIVITAMIN WITH MINERALS) TABS tablet Take 1 tablet by mouth daily.     nitroGLYCERIN (NITROSTAT) 0.4 MG SL tablet Place 1 tablet (0.4 mg total) under the tongue every 5 (five) minutes as needed for chest pain (x 3 doses). (Patient not taking: Reported on 04/01/2022) 25 tablet 11   oxybutynin (DITROPAN) 5 MG tablet Take 1 tablet (5 mg total) by mouth 3 (three) times daily. 90 tablet 1   oxyCODONE-acetaminophen (PERCOCET) 10-325 MG tablet Take 1 tablet by mouth every 6 (six) hours as needed for pain. 120 tablet 0   pantoprazole (PROTONIX) 40 MG tablet TAKE 1 TABLET BY MOUTH TWICE DAILY. (Patient taking differently: Take 40 mg by mouth 2 (two) times daily.) 180 tablet 1   polyvinyl alcohol (LIQUIFILM TEARS) 1.4 % ophthalmic solution Place 1 drop into both eyes as needed for dry eyes.     rosuvastatin (CRESTOR) 20 MG tablet Take 1 tablet (20 mg total) by mouth every evening.     traZODone (DESYREL) 50 MG tablet 1-2 tabs po qhs prn insomnia 60 tablet 0   No facility-administered medications prior to visit.    Allergies  Allergen Reactions   Abilify [Aripiprazole] Other (See Comments)    parkinsonism   Bactrim [Sulfamethoxazole-Trimethoprim] Rash    Penicillins Itching, Swelling and Rash    Tolerated Cefepime in ED. Has patient had a PCN reaction causing immediate rash, facial/tongue/throat swelling, SOB or lightheadedness with hypotension: Yes Has patient had a PCN reaction causing severe rash involving mucus membranes or skin necrosis: No Has patient had a PCN reaction that required hospitalization: No  Has patient had a PCN reaction occurring within the last 10 years: No     ROS As per HPI  PE:    05/23/2022    2:05 PM 05/22/2022    3:01 PM 04/01/2022   11:33 AM  Vitals with BMI  Height '5\' 4"'$  '5\' 4"'$  '5\' 4"'$   Weight 137 lbs 3 oz 138 lbs 142 lbs  BMI 23.54 70.26 37.85  Systolic 885 99 027  Diastolic 78 57 82  Pulse 75 74 69  02 sat 94% on RA today  Physical Exam  Gen: Alert, well appearing.  Patient is oriented to person, place, time, and situation. AFFECT: pleasant, lucid thought and speech. CV: RRR, no m/r/g.   Nonlabored respirations.  Assessment of breath sounds and aeration severely limited today due to patient wearing a thoracic/chest brace.   LABS:  Last CBC Lab Results  Component Value Date   WBC 7.5 03/26/2022   HGB 12.5 03/26/2022   HCT 38.5 03/26/2022   MCV 90.0 03/26/2022   MCH 29.2 03/26/2022   RDW 12.5 03/26/2022   PLT 190 74/11/8785   Last metabolic panel Lab Results  Component Value Date   GLUCOSE 105 (H) 03/26/2022   NA 142 03/26/2022  K 3.5 03/26/2022   CL 106 03/26/2022   CO2 28 03/26/2022   BUN 15 03/26/2022   CREATININE 0.78 03/26/2022   GFRNONAA >60 03/26/2022   CALCIUM 8.7 (L) 03/26/2022   PHOS 4.0 02/27/2022   PROT 7.3 03/26/2022   ALBUMIN 4.0 03/26/2022   BILITOT 0.3 03/26/2022   ALKPHOS 65 03/26/2022   AST 18 03/26/2022   ALT 9 03/26/2022   ANIONGAP 8 03/26/2022   Last hemoglobin A1c Lab Results  Component Value Date   HGBA1C 5.3 02/27/2022   Last thyroid functions Lab Results  Component Value Date   TSH 1.488 02/27/2022   Lab Results  Component Value Date    CHOL 129 10/11/2021   HDL 44.80 10/11/2021   LDLCALC 61 10/11/2021   TRIG 117.0 10/11/2021   CHOLHDL 3 10/11/2021   Lab Results  Component Value Date   WBC 7.5 03/26/2022   HGB 12.5 03/26/2022   HCT 38.5 03/26/2022   MCV 90.0 03/26/2022   PLT 190 03/26/2022   Lab Results  Component Value Date   IRON 116 06/05/2015   TIBC 284 06/05/2015   FERRITIN 1,612 (H) 06/05/2015   IMPRESSION AND PLAN:  #1 anxiety and depression.  Longstanding and severe. Recent med adjustments have resulted in a little bit of rebound of symptoms but I think the only adjustment I would make at this point is to add back her Lamictal.  We will start at 25 mg a day and titrate this up every 2 weeks as per protocol for this medication. Continue alprazolam 0.5 3 times daily as needed. Continue Cymbalta 60 mg a day. Continue trazodone 50 mg, 1-2 nightly as needed insomnia  #2 restless leg syndrome. I think part of this could be due to getting off her Lamictal, lowering her Cymbalta dose, and lowering Xanax dose last visit. We will cautiously start ropinirole 0.25 nightly. Therapeutic expectations and side effect profile of medication discussed today.  Patient's questions answered.  #3 hypertension.  Last home blood pressure was 128/90. We will hold off on any med changes at this time. Continue Toprol-XL 100 mg a day  An After Visit Summary was printed and given to the patient.  FOLLOW UP: No follow-ups on file.  Signed:  Crissie Sickles, MD           05/23/2022

## 2022-06-03 ENCOUNTER — Other Ambulatory Visit: Payer: Self-pay | Admitting: Physical Medicine & Rehabilitation

## 2022-06-03 DIAGNOSIS — M1712 Unilateral primary osteoarthritis, left knee: Secondary | ICD-10-CM

## 2022-06-03 DIAGNOSIS — M1711 Unilateral primary osteoarthritis, right knee: Secondary | ICD-10-CM

## 2022-06-03 NOTE — Telephone Encounter (Signed)
[facility] called  Last refill was last month on Flexeril.  Placed a call to [name], she is taking the Flexeril as needed for muscle spasms.  Refill sent to pharmacy, she verbalizes understanding.

## 2022-06-03 NOTE — Telephone Encounter (Signed)
Refill for Cyclobenzaprine. On med list but no provider listed. Not in note.

## 2022-06-06 ENCOUNTER — Encounter: Payer: Self-pay | Admitting: Family Medicine

## 2022-06-06 ENCOUNTER — Ambulatory Visit (INDEPENDENT_AMBULATORY_CARE_PROVIDER_SITE_OTHER): Payer: Medicare Other | Admitting: Family Medicine

## 2022-06-06 VITALS — BP 103/64 | HR 75 | Temp 98.2°F | Ht 64.0 in | Wt 132.0 lb

## 2022-06-06 DIAGNOSIS — F339 Major depressive disorder, recurrent, unspecified: Secondary | ICD-10-CM | POA: Diagnosis not present

## 2022-06-06 DIAGNOSIS — I2510 Atherosclerotic heart disease of native coronary artery without angina pectoris: Secondary | ICD-10-CM | POA: Diagnosis not present

## 2022-06-06 DIAGNOSIS — G2581 Restless legs syndrome: Secondary | ICD-10-CM

## 2022-06-06 DIAGNOSIS — I10 Essential (primary) hypertension: Secondary | ICD-10-CM | POA: Diagnosis not present

## 2022-06-06 MED ORDER — ROPINIROLE HCL 0.5 MG PO TABS: ORAL_TABLET | ORAL | 0 refills | Status: DC

## 2022-06-06 MED ORDER — LAMOTRIGINE 50 MG PO TBDP
ORAL_TABLET | ORAL | 0 refills | Status: DC
Start: 2022-06-06 — End: 2022-06-19

## 2022-06-06 NOTE — Progress Notes (Signed)
OFFICE VISIT  06/06/2022  CC:  Chief Complaint  Patient presents with   Anxiety   Depression   Hypertension    Patient is a 76 y.o. female who presents for 2 week f/u anx/dep, RLS, and HTN. A/P as of last visit: "#1 anxiety and depression.  Longstanding and severe. Recent med adjustments have resulted in a little bit of rebound of symptoms but I think the only adjustment I would make at this point is to add back her Lamictal.  We will start at 25 mg a day and titrate this up every 2 weeks as per protocol for this medication. Continue alprazolam 0.5 3 times daily as needed. Continue Cymbalta 60 mg a day. Continue trazodone 50 mg, 1-2 nightly as needed insomnia   #2 restless leg syndrome. I think part of this could be due to getting off her Lamictal, lowering her Cymbalta dose, and lowering Xanax dose last visit. We will cautiously start ropinirole 0.25 nightly. Therapeutic expectations and side effect profile of medication discussed today.  Patient's questions answered.   #3 hypertension.  Last home blood pressure was 128/90. We will hold off on any med changes at this time. Continue Toprol-XL 100 mg a day"  INTERIM HX: Mood has improved some. No problems with the Lamictal.  Restless legs have improved about 50%.  Occasional blood pressure check is normal, occasionally up to the point where she takes an extra half tab of Toprol.  She does not have any specific numbers that she recalls today.  It sounds like she does not have to do the extra dose of Toprol much at all.  ROS as above, plus--> no fevers, no CP, no rashes, no melena/hematochezia.  No polyuria or polydipsia.  No myalgias or arthralgias.  No focal weakness, paresthesias, or tremors.  No acute vision or hearing abnormalities.  No dysuria or unusual/new urinary urgency or frequency.  No recent changes in lower legs. No n/v/d or abd pain.  No palpitations.     Past Medical History:  Diagnosis Date   Acute upper GI  bleed 06/2014   while pt taking coumadin, plavix, and meloxicam---despite being told not to take coumadin.   Anginal pain ([place])    Nonobstructive CAD 2014; however, her cardiologist put her on a statin for this and NOT for hyperlipidemia per pt report.  Atyp CP 08/2017 at card f/u, plan for myoc perf imaging.   Anxiety    panic attacks   Asthma    w/ asbestososis    BPPV (benign paroxysmal positional vertigo) 12/16/2012   Chronic diastolic CHF (congestive heart failure) (HCC)    dry wt as of 11/06/16 is 168 lbs.   Chronic lower back pain    COPD (chronic obstructive pulmonary disease) (HCC)    DDD (degenerative disc disease)    lumbar and cervical.    Diverticular disease    Fibromyalgia    Patient states dx was around her late 28s but she had sx's for years prior to this.   H/O hiatal hernia    History of pneumonia    hospitalized 12/2011, 02/2013, and 07/2013 [facility]) for this   HTN (hypertension)    Renal artery dopplers 04/2013 neg for stenosis.   Hypervitaminosis D 09/27/2019   over-supplemented.  Stopped vit D and plan recheck 2 mo.   Idiopathic angio-edema-urticaria 72014; 2021   Angioedema component was very minimal.  2021->Dr. Bobbitt (allergist) eval.   Insomnia    Iron deficiency anemia    Hematologist in [place],  [place] did extensive w/u; no cause found; failed oral supplement;; gets fairly regular (q3mor so) IV iron infusions (Venofer -iron sucrose- '200mg'$  with procrit.  "for 14 yr I've been getting blood work q month & getting infusions prn" (07/12/2013).  Dr. EMarin Olplocally, iron infusions done, EPO deficiency dx'd   Kidney stone    Migraine syndrome    "not as often anymore; used to be ~ q wk" (07/12/2013)   Mixed incontinence urge and stress    Nephrolithiasis    "passed all on my own or they are still in there" (07/12/2013)   Neuroleptic induced parkinsonism (HCC) 2018   Dr. TCarles Collet neuro, saw her 11/24/17 and recommended d/c of abilify as first step.  D/c'd abilify and pt  got complete recovery.   Oropharyngeal dysphagia    swallowing study speech path 05/2020. Gastric bx's showed gastritis, h pylori NEG   OSA on CPAP    prior to move to [place]--had another sleep study 10/2015 w/pulm Dr. ECamillo Flaming   Osteoarthritis    "severe; progressing fast" (07/12/2013); multiple joints-not surgical candidate for TKR (03/2015).  Triamcinolon knee injections by Dr. STessa Lerner1/2019.   Pernicious anemia 08/24/2014   Pleural plaque with presence of asbestos 07/22/2013   [place] [place] [facility]2013   [facility]: pt took coumadin for about 8-9 mo   Pyelonephritis    "several times over the last 30 yr" (07/12/2013)   RBBB (right bundle branch block)    [place] major [place]    Recurrent UTI    hx of hospitalization for pyelonephritis; started abx prophylaxis 06/2015   Syncope    Hypotensive; ED visit--Dr. HTerrence Dupontdid Cath--nonobstructive CAD, EF 55-60%.  In retrospect, suspect pt rx med misuse/polypharmacy   [place]tebral compression fracture ([place]    Acute T12 on 02/27/22 (fall).  multiple old thoracic    Past Surgical History:  Procedure Laterality Date   APPENDECTOMY  1960   AXILLARY SURGERY Left 1978   Multiple "lump" in armpit per pt   BIOPSY  06/17/2020   Gastric bx->gastritis, h pylori neg.  Procedure: BIOPSY;  Surgeon: HCarol Ada MD;  Location: [facility];  Service: Endoscopy;;   CARDIAC CATHETERIZATION  01/2013   nonobstructive CAD, EF 55-60%   CARDIOVASCULAR STRESS TEST  02/22/2015   Low risk myocard perf imaging; wall motion normal, normal EF   carotid duplex doppler  10/21/2017   R vertebral flow suggestive of possible distal obstruction.  Pt declined further w/u as of 10/29/17 but need to revisit this problem periodically.   COCCYX REMOVAL  1972   DEXA  06/05/2017   T-score -3.1   DILATION AND CURETTAGE OF UTERUS  ? 1970's   ESOPHAGOGASTRODUODENOSCOPY N/A 07/19/2014   Gastritis found + in the setting of  supratherapeutic INR, +plavix, + meloxicam.   ESOPHAGOGASTRODUODENOSCOPY (EGD) WITH PROPOFOL N/A 06/17/2020   NO stricture or other prob to explain pt's dysphagia, dilation was done anyway.  Gastric bx's-->gastritis, h pylori neg. Procedure: ESOPHAGOGASTRODUODENOSCOPY (EGD) WITH PROPOFOL;  Surgeon: HCarol Ada MD;  Location: [facility];  Service: Endoscopy;  Laterality: N/A;   EYE SURGERY Left 2012-2013   "injections for ~ 1 yr; don't really know what for" (07/12/2013)   HEEL SPUR SURGERY Left 2008   kidney stone removal Right    KNEE SURGERY  2005   LEFT HEART CATHETERIZATION WITH CORONARY ANGIOGRAM N/A 01/30/2013   Procedure: LEFT HEART CATHETERIZATION WITH CORONARY ANGIOGRAM;  Surgeon: MClent Demark MD;  Location: [place] CATH [facility];  Service: Cardiovascular;  Laterality: N/A;   MALONEY DILATION  06/17/2020   Procedure: Venia Minks DILATION;  Surgeon: Carol Ada, MD;  Location: [facility];  Service: Endoscopy;;   PLANTAR FASCIA RELEASE Left 2008   SPIROMETRY  04/25/2014   In hosp for acute asthma/COPD flare: mixed obstructive and restrictive lung disease. The FEV1 is severely reduced at 45% predicted.  FEV1 signif decreased compared to prior spirometry 07/23/13.   TENDON RELEASE  1996   Right forearm and hand   TOTAL ABDOMINAL HYSTERECTOMY  1974   TRANSTHORACIC ECHOCARDIOGRAM  01/2013; 04/2014;08/2015; 09/2017   2014--NORMAL.  2015--focal basal septal hypertrophy, EF 55-60%, grade I diast dysfxn, mild LAE.  08/2015 EF 55-60%, nl LV syst fxn, grade I DD, valves wnl. 10/21/17: EF 65-70%, grd I DD, o/w normal. 02/17/22 EF 70-75%, hyperdynamic LV fxn, grd I DD.    Outpatient Medications Prior to Visit  Medication Sig Dispense Refill   albuterol (VENTOLIN HFA) 108 (90 Base) MCG/ACT inhaler Inhale 1-2 puffs into the lungs every 6 (six) hours as needed for wheezing or shortness of breath. 18 g 1   ALPRAZolam (XANAX) 0.5 MG tablet TAKE ONE TABLET BY MOUTH THREE TIMES DAILY. 90 tablet 0   aspirin 81 MG  tablet Take 81 mg by mouth at bedtime.     cyclobenzaprine (FLEXERIL) 5 MG tablet Take 1 tablet (5 mg total) by mouth 2 (two) times daily as needed for muscle spasms. 60 tablet 3   diclofenac Sodium (VOLTAREN) 1 % GEL Apply 2 g topically 4 (four) times daily. 4 times a day to both knees (Patient taking differently: Apply 2 g topically 4 (four) times daily as needed (knee pain).) 300 g 4   donepezil (ARICEPT) 10 MG tablet Take 1 tablet (10 mg total) by mouth at bedtime. 90 tablet 1   DULoxetine (CYMBALTA) 60 MG capsule Take 1 capsule (60 mg total) by mouth daily. 1 tab p.o. daily 90 capsule 1   fluconazole (DIFLUCAN) 100 MG tablet TAKE (1) TABLET DAILY AS NEEDED FOR THRUSH (Patient taking differently: Take 100 mg by mouth daily as needed (thrush symptoms).) 15 tablet 1   furosemide (LASIX) 40 MG tablet Take 1 tablet (40 mg total) by mouth daily as needed for fluid. 30 tablet 6   ipratropium (ATROVENT) 0.03 % nasal spray Place 2 sprays into both nostrils every 12 (twelve) hours as needed (allergies).     isosorbide mononitrate (IMDUR) 30 MG 24 hr tablet Take 2 tablets (60 mg total) by mouth daily. TAKE 2 TABLETS IN THE AM AND 1 TABLET IN THE PM. (Patient taking differently: Take 30-60 mg by mouth See admin instructions. Taking 2 tablets (60 mg) in the AM and 1 tablet  '30mg'$  in the evening)     methocarbamol (ROBAXIN) 500 MG tablet Take 1 tablet (500 mg total) by mouth every 6 (six) hours as needed for muscle spasms. 30 tablet 0   metoprolol succinate (TOPROL-XL) 100 MG 24 hr tablet take ONE tab daily, take additional 1/2 tab as needed FOR FOR BLOOD PRESSURE > 160/90 135 tablet 1   Multiple Vitamin (MULTIVITAMIN WITH MINERALS) TABS tablet Take 1 tablet by mouth daily.     oxybutynin (DITROPAN) 5 MG tablet Take 1 tablet (5 mg total) by mouth 3 (three) times daily. 90 tablet 1   oxyCODONE-acetaminophen (PERCOCET) 10-325 MG tablet Take 1 tablet by mouth every 6 (six) hours as needed for pain. 120 tablet 0    pantoprazole (PROTONIX) 40 MG  tablet Take 1 tablet (40 mg total) by mouth 2 (two) times daily. 180 tablet 1   polyvinyl alcohol (LIQUIFILM TEARS) 1.4 % ophthalmic solution Place 1 drop into both eyes as needed for dry eyes.     rosuvastatin (CRESTOR) 20 MG tablet Take 1 tablet (20 mg total) by mouth every evening.     traZODone (DESYREL) 50 MG tablet 1-2 tabs po qhs prn insomnia 60 tablet 5   lamoTRIgine (LAMICTAL) 25 MG tablet Take 1 tablet (25 mg total) by mouth daily. 30 tablet 0   rOPINIRole (REQUIP) 0.25 MG tablet Take 1 tablet (0.25 mg total) by mouth at bedtime. 30 tablet 0   EPINEPHrine 0.3 mg/0.3 mL IJ SOAJ injection Inject 0.3 mg into the muscle as needed for anaphylaxis. (Patient not taking: Reported on 05/23/2022) 1 each 1   nitroGLYCERIN (NITROSTAT) 0.4 MG SL tablet Place 1 tablet (0.4 mg total) under the tongue every 5 (five) minutes as needed for chest pain (x 3 doses). (Patient not taking: Reported on 04/01/2022) 25 tablet 11   No facility-administered medications prior to visit.    Allergies  Allergen Reactions   Abilify [Aripiprazole] Other (See Comments)    parkinsonism   Bactrim [Sulfamethoxazole-Trimethoprim] Rash   Penicillins Itching, Swelling and Rash    Tolerated Cefepime in ED. Has patient had a PCN reaction causing immediate rash, facial/tongue/throat swelling, SOB or lightheadedness with hypotension: Yes Has patient had a PCN reaction causing severe rash involving mucus membranes or skin necrosis: No Has patient had a PCN reaction that required hospitalization: No  Has patient had a PCN reaction occurring within the last 10 years: No     ROS As per HPI  PE:    06/06/2022    1:15 PM 05/23/2022    2:05 PM 05/22/2022    3:01 PM  Vitals with BMI  Height '5\' 4"'$  '5\' 4"'$  '5\' 4"'$   Weight 132 lbs 137 lbs 3 oz 138 lbs  BMI 22.65 64.33 29.51  Systolic 884 166 99  Diastolic 64 78 57  Pulse 75 75 74   02 sat 92% RA today  Physical Exam  Gen: Alert, well appearing.   Patient is oriented to person, place, time, and situation. AFFECT: pleasant, lucid thought and speech.  No further exam today  LABS:  Last CBC Lab Results  Component Value Date   WBC 7.5 03/26/2022   HGB 12.5 03/26/2022   HCT 38.5 03/26/2022   MCV 90.0 03/26/2022   MCH 29.2 03/26/2022   RDW 12.5 03/26/2022   PLT 190 06/21/1600   Last metabolic panel Lab Results  Component Value Date   GLUCOSE 105 (H) 03/26/2022   NA 142 03/26/2022   K 3.5 03/26/2022   CL 106 03/26/2022   CO2 28 03/26/2022   BUN 15 03/26/2022   CREATININE 0.78 03/26/2022   GFRNONAA >60 03/26/2022   CALCIUM 8.7 (L) 03/26/2022   PHOS 4.0 02/27/2022   PROT 7.3 03/26/2022   ALBUMIN 4.0 03/26/2022   BILITOT 0.3 03/26/2022   ALKPHOS 65 03/26/2022   AST 18 03/26/2022   ALT 9 03/26/2022   ANIONGAP 8 03/26/2022   IMPRESSION AND PLAN:  #1 recurrent major depressive disorder, treatment resistant. GAD. Insomnia  Increase Lamictal to 50 mg a day and consider increase again in 2 weeks. Continue alprazolam 0.5 3 times daily as needed. Continue Cymbalta 60 mg a day. Continue trazodone 50 mg, 1-2 nightly as needed insomnia  #2 restless leg syndrome.  Some improvement on 0.25 mg Requip.  Increase to 0.5 mg nightly.  3.  Hypertension, well controlled on Toprol-XL 100 mg/day.  An After Visit Summary was printed and given to the patient.  FOLLOW UP: Return for 2-3 wks f/u dep/rls/med.  Signed:  Crissie Sickles, MD           06/06/2022

## 2022-06-11 ENCOUNTER — Telehealth: Payer: Self-pay | Admitting: Cardiology

## 2022-06-11 NOTE — Telephone Encounter (Signed)
Returned call to patient, patient aware no appt needed.  Pre op note entered 4/21 for TENS unit,  clearance provided by Dr. Stanford Breed via telephone note 5/10.

## 2022-06-11 NOTE — Telephone Encounter (Signed)
New Message:    Patient says she does not need a Pre-Op appointment. She says she does not have any surgery or procedure scheduled.

## 2022-06-12 ENCOUNTER — Telehealth: Payer: Self-pay

## 2022-06-12 DIAGNOSIS — Z961 Presence of intraocular lens: Secondary | ICD-10-CM | POA: Diagnosis not present

## 2022-06-12 DIAGNOSIS — H3410 Central retinal artery occlusion, unspecified eye: Secondary | ICD-10-CM | POA: Diagnosis not present

## 2022-06-12 NOTE — Telephone Encounter (Signed)
Received addendum for delayed certification for skilled therapy services thru [employer].  Placed on PCP desk to review and sign, if appropriate.

## 2022-06-13 ENCOUNTER — Telehealth: Payer: Self-pay

## 2022-06-13 MED ORDER — TRAZODONE HCL 50 MG PO TABS: ORAL_TABLET | ORAL | 5 refills | Status: DC

## 2022-06-13 NOTE — Telephone Encounter (Signed)
Okay, new prescription sent 

## 2022-06-13 NOTE — Telephone Encounter (Signed)
Spoke with pt, for years she was taking Trazodone '50mg'$  2-4 tabs nightly and recently rx decreased to 1-2 nightly which does not help at all. She would like updated rx for 2-4 tabs nightly. Pt was last seen 6/15. F/u appt scheduled 6/28   Please review and advise

## 2022-06-13 NOTE — Telephone Encounter (Signed)
Signed and put in box to go up front. Signed:  Crissie Sickles, MD           06/13/2022

## 2022-06-13 NOTE — Telephone Encounter (Signed)
Pt advised of new rx.  

## 2022-06-13 NOTE — Telephone Encounter (Signed)
Patient states there was a recent adjust made to her medications.  Patient states she isn't able to sleep as well at night any longer.  Please advise [phone]

## 2022-06-19 ENCOUNTER — Telehealth (INDEPENDENT_AMBULATORY_CARE_PROVIDER_SITE_OTHER): Payer: Medicare Other | Admitting: Family Medicine

## 2022-06-19 ENCOUNTER — Encounter: Payer: Self-pay | Admitting: Family Medicine

## 2022-06-19 VITALS — BP 150/134 | HR 92

## 2022-06-19 DIAGNOSIS — G2581 Restless legs syndrome: Secondary | ICD-10-CM | POA: Diagnosis not present

## 2022-06-19 DIAGNOSIS — F339 Major depressive disorder, recurrent, unspecified: Secondary | ICD-10-CM

## 2022-06-19 DIAGNOSIS — I2510 Atherosclerotic heart disease of native coronary artery without angina pectoris: Secondary | ICD-10-CM

## 2022-06-19 DIAGNOSIS — I10 Essential (primary) hypertension: Secondary | ICD-10-CM | POA: Diagnosis not present

## 2022-06-19 MED ORDER — LAMOTRIGINE 100 MG PO TABS: 100.0000 mg | ORAL_TABLET | Freq: Every day | ORAL | 0 refills | Status: DC

## 2022-06-19 MED ORDER — ROPINIROLE HCL 0.5 MG PO TABS
ORAL_TABLET | ORAL | 1 refills | Status: DC
Start: 2022-06-19 — End: 2022-10-09

## 2022-06-19 NOTE — Progress Notes (Signed)
Virtual Visit via Video Note  I connected with Linda [name] on 06/19/22 at  1:00 PM EDT by a video enabled telemedicine application and verified that I am speaking with the correct person using two identifiers.  Location patient: [place] Location provider:work or home office Persons participating in the virtual visit: patient, provider  I discussed the limitations and requested verbal permission for telemedicine visit. The patient expressed understanding and agreed to proceed.  CC: Patient is a 76 y.o. female who presents for 2-week follow-up major depressive disorder and restless leg syndrome A/P as of last visit: "#1 recurrent major depressive disorder, treatment resistant. GAD. Insomnia  Increase Lamictal to 50 mg a day and consider increase again in 2 weeks. Continue alprazolam 0.5 3 times daily as needed. Continue Cymbalta 60 mg a day. Continue trazodone 50 mg, 1-2 nightly as needed insomnia   #2 restless leg syndrome.  Some improvement on 0.25 mg Requip. Increase to 0.5 mg nightly.  3.  Hypertension, well controlled on Toprol-XL 100 mg/day"  INTERIM HX: Still struggling with anhedonia and poor motivation. Anxiety level seems pretty stable.  Restless legs symptoms around bedtime are responding very well to Requip 0.5 mg dose. She has some morning restlessness that seems to be more full body but this seems to resolve after she gets up and moving around for the day.  Her blood pressure is typically in the 26-948 systolic range but today it is up to 546 systolic over 270 diastolic. She does say she is in significantly more pain than her usual day.  No headaches, visual symptoms, chest pain, or focal weakness  ROS: See pertinent positives and negatives per HPI.  Past Medical History:  Diagnosis Date   Acute upper GI bleed 06/2014   while pt taking coumadin, plavix, and meloxicam---despite being told not to take coumadin.   Anginal pain ([place])    Nonobstructive CAD 2014; however, her  cardiologist put her on a statin for this and NOT for hyperlipidemia per pt report.  Atyp CP 08/2017 at card f/u, plan for myoc perf imaging.   Anxiety    panic attacks   Asthma    w/ asbestososis    BPPV (benign paroxysmal positional vertigo) 12/16/2012   Chronic diastolic CHF (congestive heart failure) (HCC)    dry wt as of 11/06/16 is 168 lbs.   Chronic lower back pain    COPD (chronic obstructive pulmonary disease) (HCC)    DDD (degenerative disc disease)    lumbar and cervical.    Diverticular disease    Fibromyalgia    Patient states dx was around her late 55s but she had sx's for years prior to this.   H/O hiatal hernia    History of pneumonia    hospitalized 12/2011, 02/2013, and 07/2013 [facility]) for this   HTN (hypertension)    Renal artery dopplers 04/2013 neg for stenosis.   Hypervitaminosis D 09/27/2019   over-supplemented.  Stopped vit D and plan recheck 2 mo.   Idiopathic angio-edema-urticaria 72014; 2021   Angioedema component was very minimal.  2021->Dr. Bobbitt (allergist) eval.   Insomnia    Iron deficiency anemia    Hematologist in [place], [place] did extensive w/u; no cause found; failed oral supplement;; gets fairly regular (q71mor so) IV iron infusions (Venofer -iron sucrose- '200mg'$  with procrit.  "for 14 yr I've been getting blood work q month & getting infusions prn" (07/12/2013).  Dr. EMarin Olplocally, iron infusions done, EPO deficiency dx'd   Kidney stone  Migraine syndrome    "not as often anymore; used to be ~ q wk" (07/12/2013)   Mixed incontinence urge and stress    Nephrolithiasis    "passed all on my own or they are still in there" (07/12/2013)   Neuroleptic induced parkinsonism ([place]) 2018   Dr. Carles Collet, neuro, saw her 11/24/17 and recommended d/c of abilify as first step.  D/c'd abilify and pt got complete recovery.   Oropharyngeal dysphagia    swallowing study speech path 05/2020. Gastric bx's showed gastritis, h pylori NEG   OSA on CPAP    prior to move to  [place]--had another sleep study 10/2015 w/pulm Dr. Camillo Flaming.   Osteoarthritis    "severe; progressing fast" (07/12/2013); multiple joints-not surgical candidate for TKR (03/2015).  Triamcinolon knee injections by Dr. Tessa Lerner 12/2017.   Pernicious anemia 08/24/2014   Pleural plaque with presence of asbestos 07/22/2013   Pulmonary embolism ([place]) 07/2013   Dx at [facility] with very small peripheral upper lobe pe 07/2013: pt took coumadin for about 8-9 mo   Pyelonephritis    "several times over the last 30 yr" (07/12/2013)   RBBB (right bundle branch block)    Recurrent major depression ([place])    Recurrent UTI    hx of hospitalization for pyelonephritis; started abx prophylaxis 06/2015   Syncope    Hypotensive; ED visit--Dr. Terrence Dupont did Cath--nonobstructive CAD, EF 55-60%.  In retrospect, suspect pt rx med misuse/polypharmacy   Vertebral compression fracture ([place])    Acute T12 on 02/27/22 (fall).  multiple old thoracic    Past Surgical History:  Procedure Laterality Date   APPENDECTOMY  1960   AXILLARY SURGERY Left 1978   Multiple "lump" in armpit per pt   BIOPSY  06/17/2020   Gastric bx->gastritis, h pylori neg.  Procedure: BIOPSY;  Surgeon: Carol Ada, MD;  Location: [facility];  Service: Endoscopy;;   CARDIAC CATHETERIZATION  01/2013   nonobstructive CAD, EF 55-60%   CARDIOVASCULAR STRESS TEST  02/22/2015   Low risk myocard perf imaging; wall motion normal, normal EF   carotid duplex doppler  10/21/2017   R vertebral flow suggestive of possible distal obstruction.  Pt declined further w/u as of 10/29/17 but need to revisit this problem periodically.   COCCYX REMOVAL  1972   DEXA  06/05/2017   T-score -3.1   DILATION AND CURETTAGE OF UTERUS  ? 1970's   ESOPHAGOGASTRODUODENOSCOPY N/A 07/19/2014   Gastritis found + in the setting of supratherapeutic INR, +plavix, + meloxicam.   ESOPHAGOGASTRODUODENOSCOPY (EGD) WITH PROPOFOL N/A 06/17/2020   NO stricture or other prob to explain pt's dysphagia,  dilation was done anyway.  Gastric bx's-->gastritis, h pylori neg. Procedure: ESOPHAGOGASTRODUODENOSCOPY (EGD) WITH PROPOFOL;  Surgeon: Carol Ada, MD;  Location: [facility];  Service: Endoscopy;  Laterality: N/A;   EYE SURGERY Left 2012-2013   "injections for ~ 1 yr; don't really know what for" (07/12/2013)   HEEL SPUR SURGERY Left 2008   kidney stone removal Right    KNEE SURGERY  2005   LEFT HEART CATHETERIZATION WITH CORONARY ANGIOGRAM N/A 01/30/2013   Procedure: LEFT HEART CATHETERIZATION WITH CORONARY ANGIOGRAM;  Surgeon: Clent Demark, MD;  Location: [facility];  Service: Cardiovascular;  Laterality: N/A;   MALONEY DILATION  06/17/2020   Procedure: Venia Minks DILATION;  Surgeon: Carol Ada, MD;  Location: [facility];  Service: Endoscopy;;   PLANTAR FASCIA RELEASE Left 2008   SPIROMETRY  04/25/2014   In hosp for acute asthma/COPD flare: mixed obstructive and restrictive lung  disease. The FEV1 is severely reduced at 45% predicted.  FEV1 signif decreased compared to prior spirometry 07/23/13.   TENDON RELEASE  1996   Right forearm and hand   TOTAL ABDOMINAL HYSTERECTOMY  1974   TRANSTHORACIC ECHOCARDIOGRAM  01/2013; 04/2014;08/2015; 09/2017   2014--NORMAL.  2015--focal basal septal hypertrophy, EF 55-60%, grade I diast dysfxn, mild LAE.  08/2015 EF 55-60%, nl LV syst fxn, grade I DD, valves wnl. 10/21/17: EF 65-70%, grd I DD, o/w normal. 02/17/22 EF 70-75%, hyperdynamic LV fxn, grd I DD.    Current Outpatient Medications:    albuterol (VENTOLIN HFA) 108 (90 Base) MCG/ACT inhaler, Inhale 1-2 puffs into the lungs every 6 (six) hours as needed for wheezing or shortness of breath., Disp: 18 g, Rfl: 1   ALPRAZolam (XANAX) 0.5 MG tablet, TAKE ONE TABLET BY MOUTH THREE TIMES DAILY., Disp: 90 tablet, Rfl: 0   aspirin 81 MG tablet, Take 81 mg by mouth at bedtime., Disp: , Rfl:    cyclobenzaprine (FLEXERIL) 5 MG tablet, Take 1 tablet (5 mg total) by mouth 2 (two) times daily as needed for muscle  spasms., Disp: 60 tablet, Rfl: 3   diclofenac Sodium (VOLTAREN) 1 % GEL, Apply 2 g topically 4 (four) times daily. 4 times a day to both knees (Patient taking differently: Apply 2 g topically 4 (four) times daily as needed (knee pain).), Disp: 300 g, Rfl: 4   donepezil (ARICEPT) 10 MG tablet, Take 1 tablet (10 mg total) by mouth at bedtime., Disp: 90 tablet, Rfl: 1   DULoxetine (CYMBALTA) 60 MG capsule, Take 1 capsule (60 mg total) by mouth daily. 1 tab p.o. daily, Disp: 90 capsule, Rfl: 1   EPINEPHrine 0.3 mg/0.3 mL IJ SOAJ injection, Inject 0.3 mg into the muscle as needed for anaphylaxis., Disp: 1 each, Rfl: 1   fluconazole (DIFLUCAN) 100 MG tablet, TAKE (1) TABLET DAILY AS NEEDED FOR THRUSH (Patient taking differently: Take 100 mg by mouth daily as needed (thrush symptoms).), Disp: 15 tablet, Rfl: 1   furosemide (LASIX) 40 MG tablet, Take 1 tablet (40 mg total) by mouth daily as needed for fluid., Disp: 30 tablet, Rfl: 6   ipratropium (ATROVENT) 0.03 % nasal spray, Place 2 sprays into both nostrils every 12 (twelve) hours as needed (allergies)., Disp: , Rfl:    isosorbide mononitrate (IMDUR) 30 MG 24 hr tablet, Take 2 tablets (60 mg total) by mouth daily. TAKE 2 TABLETS IN THE AM AND 1 TABLET IN THE PM. (Patient taking differently: Take 30-60 mg by mouth See admin instructions. Taking 2 tablets (60 mg) in the AM and 1 tablet  '30mg'$  in the evening), Disp: , Rfl:    lamoTRIgine (LAMICTAL) 100 MG tablet, Take 1 tablet (100 mg total) by mouth daily., Disp: 30 tablet, Rfl: 0   methocarbamol (ROBAXIN) 500 MG tablet, Take 1 tablet (500 mg total) by mouth every 6 (six) hours as needed for muscle spasms., Disp: 30 tablet, Rfl: 0   metoprolol succinate (TOPROL-XL) 100 MG 24 hr tablet, take ONE tab daily, take additional 1/2 tab as needed FOR FOR BLOOD PRESSURE > 160/90, Disp: 135 tablet, Rfl: 1   Multiple Vitamin (MULTIVITAMIN WITH MINERALS) TABS tablet, Take 1 tablet by mouth daily., Disp: , Rfl:     nitroGLYCERIN (NITROSTAT) 0.4 MG SL tablet, Place 1 tablet (0.4 mg total) under the tongue every 5 (five) minutes as needed for chest pain (x 3 doses)., Disp: 25 tablet, Rfl: 11   oxybutynin (DITROPAN) 5 MG tablet, Take  1 tablet (5 mg total) by mouth 3 (three) times daily., Disp: 90 tablet, Rfl: 1   oxyCODONE-acetaminophen (PERCOCET) 10-325 MG tablet, Take 1 tablet by mouth every 6 (six) hours as needed for pain., Disp: 120 tablet, Rfl: 0   pantoprazole (PROTONIX) 40 MG tablet, Take 1 tablet (40 mg total) by mouth 2 (two) times daily., Disp: 180 tablet, Rfl: 1   polyvinyl alcohol (LIQUIFILM TEARS) 1.4 % ophthalmic solution, Place 1 drop into both eyes as needed for dry eyes., Disp: , Rfl:    rosuvastatin (CRESTOR) 20 MG tablet, Take 1 tablet (20 mg total) by mouth every evening., Disp: , Rfl:    traZODone (DESYREL) 50 MG tablet, 2-4 tabs po qhs prn insomnia, Disp: 120 tablet, Rfl: 5   rOPINIRole (REQUIP) 0.5 MG tablet, 1 tab po qhs, Disp: 90 tablet, Rfl: 1  EXAM:  VITALS per patient if applicable:     9/38/1017    1:07 PM 06/06/2022    1:15 PM 05/23/2022    2:05 PM  Vitals with BMI  Height  '5\' 4"'$  '5\' 4"'$   Weight  132 lbs 137 lbs 3 oz  BMI  51.02 58.52  Systolic 778 242 353  Diastolic 614 64 78  Pulse 92 75 75    GENERAL: alert, oriented, appears well and in no acute distress  HEENT: atraumatic, conjunttiva clear, no obvious abnormalities on inspection of external nose and ears  NECK: normal movements of the head and neck  LUNGS: on inspection no signs of respiratory distress, breathing rate appears normal, no obvious gross SOB, gasping or wheezing  CV: no obvious cyanosis  MS: moves all visible extremities without noticeable abnormality  PSYCH/NEURO: pleasant and cooperative, no obvious depression or anxiety, speech and thought processing grossly intact  LABS: none today    Chemistry      Component Value Date/Time   NA 142 03/26/2022 1015   NA 141 06/05/2015 1315   K 3.5  03/26/2022 1015   K 3.9 06/05/2015 1315   CL 106 03/26/2022 1015   CL 103 06/05/2015 1315   CO2 28 03/26/2022 1015   CO2 28 06/05/2015 1315   BUN 15 03/26/2022 1015   BUN 14 06/05/2015 1315   CREATININE 0.78 03/26/2022 1015   CREATININE 0.87 02/03/2017 1523      Component Value Date/Time   CALCIUM 8.7 (L) 03/26/2022 1015   CALCIUM 9.0 06/05/2015 1315   ALKPHOS 65 03/26/2022 1015   ALKPHOS 84 06/05/2015 1315   AST 18 03/26/2022 1015   AST 23 06/05/2015 1315   ALT 9 03/26/2022 1015   ALT 17 06/05/2015 1315   BILITOT 0.3 03/26/2022 1015   BILITOT 1.00 06/05/2015 1315     Lab Results  Component Value Date   WBC 7.5 03/26/2022   HGB 12.5 03/26/2022   HCT 38.5 03/26/2022   MCV 90.0 03/26/2022   PLT 190 03/26/2022   ASSESSMENT AND PLAN:  Discussed the following assessment and plan:  #1 recurrent major depressive disorder, treatment resistant. Increase Lamictal to 100 mg a day.  Continue Cymbalta at 60 mg a day. Plan increase to 200 mg after I see her in 2 weeks for follow-up.  #2 restless leg syndrome, good response to 0.5 mg ropinirole. Refilled today.  #3 hypertension.  Has been labile in the past She has an unusually high blood pressure for her today.  She will take an extra half of Toprol XL as per her usual protocol. She will continue to check blood pressure throughout  today and call if this is persistently elevated.  I discussed the assessment and treatment plan with the patient. The patient was provided an opportunity to ask questions and all were answered. The patient agreed with the plan and demonstrated an understanding of the instructions.   F/u: 2 weeks  Signed:  Crissie Sickles, MD           06/19/2022

## 2022-06-20 DIAGNOSIS — S22080D Wedge compression fracture of T11-T12 vertebra, subsequent encounter for fracture with routine healing: Secondary | ICD-10-CM | POA: Diagnosis not present

## 2022-06-20 DIAGNOSIS — S22080G Wedge compression fracture of T11-T12 vertebra, subsequent encounter for fracture with delayed healing: Secondary | ICD-10-CM | POA: Diagnosis not present

## 2022-06-24 ENCOUNTER — Encounter: Payer: Self-pay | Admitting: Family Medicine

## 2022-06-26 ENCOUNTER — Telehealth: Payer: Self-pay

## 2022-06-26 NOTE — Telephone Encounter (Signed)
Please assist patient with scheduling, thanks.   [facility] - Client Nonclinical Telephone Record  [facility] Client [place] [facility] - Client Client Site [place] - Day Provider Crissie Sickles - MD Contact Type Call Who Is Calling Patient / Member / Family / Caregiver Caller Name [place] Phone Number [phone] Patient Name Linda [name] Patient DOB July 22, 1946 Call Type Message Only Information Provided Reason for Call Request to Reschedule Office Appointment Initial Comment Caller states she a virtual appointment on the 12th and she is wanting to reschedule. Disp. Time Disposition Final User 06/24/2022 3:19:27 PM General Information Provided Yes Uvaldo Rising Call Closed By: Uvaldo Rising Transaction Date/Time: 06/24/2022 3:17:22 PM (ET)

## 2022-07-01 ENCOUNTER — Other Ambulatory Visit: Payer: Self-pay | Admitting: Physical Medicine & Rehabilitation

## 2022-07-01 DIAGNOSIS — M1712 Unilateral primary osteoarthritis, left knee: Secondary | ICD-10-CM

## 2022-07-01 DIAGNOSIS — M1711 Unilateral primary osteoarthritis, right knee: Secondary | ICD-10-CM

## 2022-07-02 ENCOUNTER — Other Ambulatory Visit: Payer: Self-pay | Admitting: Family Medicine

## 2022-07-02 DIAGNOSIS — S22080G Wedge compression fracture of T11-T12 vertebra, subsequent encounter for fracture with delayed healing: Secondary | ICD-10-CM | POA: Diagnosis not present

## 2022-07-02 DIAGNOSIS — M546 Pain in thoracic spine: Secondary | ICD-10-CM | POA: Diagnosis not present

## 2022-07-03 ENCOUNTER — Encounter: Payer: Self-pay | Admitting: Family Medicine

## 2022-07-03 ENCOUNTER — Telehealth (INDEPENDENT_AMBULATORY_CARE_PROVIDER_SITE_OTHER): Payer: Medicare Other | Admitting: Family Medicine

## 2022-07-03 ENCOUNTER — Telehealth: Payer: Medicare Other | Admitting: Family Medicine

## 2022-07-03 VITALS — BP 150/110 | HR 71

## 2022-07-03 DIAGNOSIS — I2510 Atherosclerotic heart disease of native coronary artery without angina pectoris: Secondary | ICD-10-CM

## 2022-07-03 DIAGNOSIS — F411 Generalized anxiety disorder: Secondary | ICD-10-CM | POA: Diagnosis not present

## 2022-07-03 DIAGNOSIS — E7800 Pure hypercholesterolemia, unspecified: Secondary | ICD-10-CM

## 2022-07-03 DIAGNOSIS — F339 Major depressive disorder, recurrent, unspecified: Secondary | ICD-10-CM | POA: Diagnosis not present

## 2022-07-03 MED ORDER — LAMOTRIGINE 200 MG PO TABS: 200.0000 mg | ORAL_TABLET | Freq: Every day | ORAL | 1 refills | Status: DC

## 2022-07-03 MED ORDER — OXYBUTYNIN CHLORIDE 5 MG PO TABS: 5.0000 mg | ORAL_TABLET | Freq: Three times a day (TID) | ORAL | 1 refills | Status: DC

## 2022-07-03 MED ORDER — ROSUVASTATIN CALCIUM 20 MG PO TABS: 20.0000 mg | ORAL_TABLET | Freq: Every evening | ORAL | 1 refills | Status: DC

## 2022-07-03 MED ORDER — DONEPEZIL HCL 10 MG PO TABS: ORAL_TABLET | ORAL | 1 refills | Status: DC

## 2022-07-03 MED ORDER — ALPRAZOLAM 0.5 MG PO TABS: ORAL_TABLET | ORAL | 0 refills | Status: DC

## 2022-07-03 MED ORDER — DULOXETINE HCL 60 MG PO CPEP: 60.0000 mg | ORAL_CAPSULE | Freq: Every day | ORAL | 1 refills | Status: DC

## 2022-07-03 NOTE — Progress Notes (Signed)
Virtual Visit via Video Note  I connected with Linda [name] on 07/03/22 at  3:00 PM EDT by a video enabled telemedicine application and verified that I am speaking with the correct person using two identifiers.  Location patient: [place] Location provider:work or home office Persons participating in the virtual visit: patient, provider  I discussed the limitations and requested verbal permission for telemedicine visit. The patient expressed understanding and agreed to proceed.  CC: 76 year old female being seen today for 2-week follow-up recurrent major depression disorder, treatment resistant. A/P as of last visit: "#1 recurrent major depressive disorder, treatment resistant. Increase Lamictal to 100 mg a day.  Continue Cymbalta at 60 mg a day. Plan increase to 200 mg after I see her in 2 weeks for follow-up.   #2 restless leg syndrome, good response to 0.5 mg ropinirole. Refilled today.   #3 hypertension.  Has been labile in the past She has an unusually high blood pressure for her today.  She will take an extra half of Toprol XL as per her usual protocol. She will continue to check blood pressure throughout today and call if this is persistently elevated."  INTERIM HX: Linda [name] is doing well. She continues to improve regarding her mood and anxiety. She is ready to go up to the 200 mg Lamictal dose.   ROS: See pertinent positives and negatives per HPI.  Past Medical History:  Diagnosis Date   Acute upper GI bleed 06/2014   while pt taking coumadin, plavix, and meloxicam---despite being told not to take coumadin.   Anginal pain ([place])    Nonobstructive CAD 2014; however, her cardiologist put her on a statin for this and NOT for hyperlipidemia per pt report.  Atyp CP 08/2017 at card f/u, plan for myoc perf imaging.   Anxiety    panic attacks   Asthma    w/ asbestososis    BPPV (benign paroxysmal positional vertigo) 12/16/2012   Chronic diastolic CHF (congestive heart failure) (HCC)    dry wt  as of 11/06/16 is 168 lbs.   Chronic lower back pain    COPD (chronic obstructive pulmonary disease) (HCC)    DDD (degenerative disc disease)    lumbar and cervical.    Diverticular disease    Fibromyalgia    Patient states dx was around her late 87s but she had sx's for years prior to this.   H/O hiatal hernia    History of pneumonia    hospitalized 12/2011, 02/2013, and 07/2013 [facility]) for this   HTN (hypertension)    Renal artery dopplers 04/2013 neg for stenosis.   Hypervitaminosis D 09/27/2019   over-supplemented.  Stopped vit D and plan recheck 2 mo.   Idiopathic angio-edema-urticaria 72014; 2021   Angioedema component was very minimal.  2021->Dr. Bobbitt (allergist) eval.   Insomnia    Iron deficiency anemia    Hematologist in [place], [place] did extensive w/u; no cause found; failed oral supplement;; gets fairly regular (q21mor so) IV iron infusions (Venofer -iron sucrose- '200mg'$  with procrit.  "for 14 yr I've been getting blood work q month & getting infusions prn" (07/12/2013).  Dr. EMarin Olplocally, iron infusions done, EPO deficiency dx'd   Kidney stone    Migraine syndrome    "not as often anymore; used to be ~ q wk" (07/12/2013)   Mixed incontinence urge and stress    Nephrolithiasis    "passed all on my own or they are still in there" (07/12/2013)   Neuroleptic induced parkinsonism ([place] 2018  Dr. Carles Collet, neuro, saw her 11/24/17 and recommended d/c of abilify as first step.  D/c'd abilify and pt got complete recovery.   Oropharyngeal dysphagia    swallowing study speech path 05/2020. Gastric bx's showed gastritis, h pylori NEG   OSA on CPAP    prior to move to [place]--had another sleep study 10/2015 w/pulm Dr. Camillo Flaming.   Osteoarthritis    "severe; progressing fast" (07/12/2013); multiple joints-not surgical candidate for TKR (03/2015).  Triamcinolon knee injections by Dr. Tessa Lerner 12/2017.   Pernicious anemia 08/24/2014   Pleural plaque with presence of asbestos 07/22/2013   Pulmonary  embolism ([place]) 07/2013   Dx at [facility] with very small peripheral upper lobe pe 07/2013: pt took coumadin for about 8-9 mo   Pyelonephritis    "several times over the last 30 yr" (07/12/2013)   RBBB (right bundle branch block)    Recurrent major depression ([place])    Recurrent UTI    hx of hospitalization for pyelonephritis; started abx prophylaxis 06/2015   Syncope    Hypotensive; ED visit--Dr. Terrence Dupont did Cath--nonobstructive CAD, EF 55-60%.  In retrospect, suspect pt rx med misuse/polypharmacy   Vertebral compression fracture ([place])    Acute T12 on 02/27/22 (fall).  multiple old thoracic-->neurosurg to do MRI as of 05/2022    Past Surgical History:  Procedure Laterality Date   APPENDECTOMY  1960   AXILLARY SURGERY Left 1978   Multiple "lump" in armpit per pt   BIOPSY  06/17/2020   Gastric bx->gastritis, h pylori neg.  Procedure: BIOPSY;  Surgeon: Carol Ada, MD;  Location: [facility];  Service: Endoscopy;;   CARDIAC CATHETERIZATION  01/2013   nonobstructive CAD, EF 55-60%   CARDIOVASCULAR STRESS TEST  02/22/2015   Low risk myocard perf imaging; wall motion normal, normal EF   carotid duplex doppler  10/21/2017   R vertebral flow suggestive of possible distal obstruction.  Pt declined further w/u as of 10/29/17 but need to revisit this problem periodically.   COCCYX REMOVAL  1972   DEXA  06/05/2017   T-score -3.1   DILATION AND CURETTAGE OF UTERUS  ? 1970's   ESOPHAGOGASTRODUODENOSCOPY N/A 07/19/2014   Gastritis found + in the setting of supratherapeutic INR, +plavix, + meloxicam.   ESOPHAGOGASTRODUODENOSCOPY (EGD) WITH PROPOFOL N/A 06/17/2020   NO stricture or other prob to explain pt's dysphagia, dilation was done anyway.  Gastric bx's-->gastritis, h pylori neg. Procedure: ESOPHAGOGASTRODUODENOSCOPY (EGD) WITH PROPOFOL;  Surgeon: Carol Ada, MD;  Location: [facility];  Service: Endoscopy;  Laterality: N/A;   EYE SURGERY Left 2012-2013   "injections for ~ 1 yr; don't really know  what for" (07/12/2013)   HEEL SPUR SURGERY Left 2008   kidney stone removal Right    KNEE SURGERY  2005   LEFT HEART CATHETERIZATION WITH CORONARY ANGIOGRAM N/A 01/30/2013   Procedure: LEFT HEART CATHETERIZATION WITH CORONARY ANGIOGRAM;  Surgeon: Clent Demark, MD;  Location: [facility];  Service: Cardiovascular;  Laterality: N/A;   MALONEY DILATION  06/17/2020   Procedure: Venia Minks DILATION;  Surgeon: Carol Ada, MD;  Location: [facility];  Service: Endoscopy;;   PLANTAR FASCIA RELEASE Left 2008   SPIROMETRY  04/25/2014   In hosp for acute asthma/COPD flare: mixed obstructive and restrictive lung disease. The FEV1 is severely reduced at 45% predicted.  FEV1 signif decreased compared to prior spirometry 07/23/13.   TENDON RELEASE  1996   Right forearm and hand   TOTAL ABDOMINAL HYSTERECTOMY  1974   TRANSTHORACIC ECHOCARDIOGRAM  01/2013; 04/2014;08/2015; 09/2017  2014--NORMAL.  2015--focal basal septal hypertrophy, EF 55-60%, grade I diast dysfxn, mild LAE.  08/2015 EF 55-60%, nl LV syst fxn, grade I DD, valves wnl. 10/21/17: EF 65-70%, grd I DD, o/w normal. 02/17/22 EF 70-75%, hyperdynamic LV fxn, grd I DD.     Current Outpatient Medications:    albuterol (VENTOLIN HFA) 108 (90 Base) MCG/ACT inhaler, Inhale 1-2 puffs into the lungs every 6 (six) hours as needed for wheezing or shortness of breath., Disp: 18 g, Rfl: 1   ALPRAZolam (XANAX) 0.5 MG tablet, TAKE ONE TABLET BY MOUTH THREE TIMES DAILY., Disp: 90 tablet, Rfl: 0   aspirin 81 MG tablet, Take 81 mg by mouth at bedtime., Disp: , Rfl:    cyclobenzaprine (FLEXERIL) 5 MG tablet, Take 1 tablet (5 mg total) by mouth 2 (two) times daily as needed for muscle spasms., Disp: 60 tablet, Rfl: 3   diclofenac Sodium (VOLTAREN) 1 % GEL, Apply 2 g topically 4 (four) times daily. 4 times a day to both knees (Patient taking differently: Apply 2 g topically 4 (four) times daily as needed (knee pain).), Disp: 300 g, Rfl: 4   donepezil (ARICEPT) 10 MG  tablet, Take 1 tablet (10 mg total) by mouth at bedtime., Disp: 90 tablet, Rfl: 1   DULoxetine (CYMBALTA) 60 MG capsule, Take 1 capsule (60 mg total) by mouth daily. 1 tab p.o. daily, Disp: 90 capsule, Rfl: 1   fluconazole (DIFLUCAN) 100 MG tablet, TAKE (1) TABLET DAILY AS NEEDED FOR THRUSH (Patient taking differently: Take 100 mg by mouth daily as needed (thrush symptoms).), Disp: 15 tablet, Rfl: 1   furosemide (LASIX) 40 MG tablet, Take 1 tablet (40 mg total) by mouth daily as needed for fluid., Disp: 30 tablet, Rfl: 6   ipratropium (ATROVENT) 0.03 % nasal spray, Place 2 sprays into both nostrils every 12 (twelve) hours as needed (allergies)., Disp: , Rfl:    isosorbide mononitrate (IMDUR) 30 MG 24 hr tablet, Take 2 tablets (60 mg total) by mouth daily. TAKE 2 TABLETS IN THE AM AND 1 TABLET IN THE PM. (Patient taking differently: Take 30-60 mg by mouth See admin instructions. Taking 2 tablets (60 mg) in the AM and 1 tablet  '30mg'$  in the evening), Disp: , Rfl:    lamoTRIgine (LAMICTAL) 100 MG tablet, Take 1 tablet (100 mg total) by mouth daily., Disp: 30 tablet, Rfl: 0   methocarbamol (ROBAXIN) 500 MG tablet, Take 1 tablet (500 mg total) by mouth every 6 (six) hours as needed for muscle spasms., Disp: 30 tablet, Rfl: 0   metoprolol succinate (TOPROL-XL) 100 MG 24 hr tablet, take ONE tab daily, take additional 1/2 tab as needed FOR FOR BLOOD PRESSURE > 160/90, Disp: 135 tablet, Rfl: 1   Multiple Vitamin (MULTIVITAMIN WITH MINERALS) TABS tablet, Take 1 tablet by mouth daily., Disp: , Rfl:    nitroGLYCERIN (NITROSTAT) 0.4 MG SL tablet, Place 1 tablet (0.4 mg total) under the tongue every 5 (five) minutes as needed for chest pain (x 3 doses)., Disp: 25 tablet, Rfl: 11   oxybutynin (DITROPAN) 5 MG tablet, Take 1 tablet (5 mg total) by mouth 3 (three) times daily., Disp: 90 tablet, Rfl: 1   oxyCODONE-acetaminophen (PERCOCET) 10-325 MG tablet, Take 1 tablet by mouth every 6 (six) hours as needed for pain.,  Disp: 120 tablet, Rfl: 0   pantoprazole (PROTONIX) 40 MG tablet, Take 1 tablet (40 mg total) by mouth 2 (two) times daily., Disp: 180 tablet, Rfl: 1   polyvinyl alcohol (LIQUIFILM  TEARS) 1.4 % ophthalmic solution, Place 1 drop into both eyes as needed for dry eyes., Disp: , Rfl:    rOPINIRole (REQUIP) 0.5 MG tablet, 1 tab po qhs, Disp: 90 tablet, Rfl: 1   rosuvastatin (CRESTOR) 20 MG tablet, Take 1 tablet (20 mg total) by mouth every evening., Disp: , Rfl:    traZODone (DESYREL) 50 MG tablet, 2-4 tabs po qhs prn insomnia, Disp: 120 tablet, Rfl: 5   EPINEPHrine 0.3 mg/0.3 mL IJ SOAJ injection, Inject 0.3 mg into the muscle as needed for anaphylaxis. (Patient not taking: Reported on 07/03/2022), Disp: 1 each, Rfl: 1  EXAM:  VITALS per patient if applicable:     7/42/5956    2:59 PM 06/19/2022    1:07 PM 06/06/2022    1:15 PM  Vitals with BMI  Height   '5\' 4"'$   Weight   132 lbs  BMI   38.75  Systolic 643 329 518  Diastolic 841 660 64  Pulse 71 92 75    GENERAL: alert, oriented, appears well and in no acute distress  HEENT: atraumatic, conjunttiva clear, no obvious abnormalities on inspection of external nose and ears  NECK: normal movements of the head and neck  LUNGS: on inspection no signs of respiratory distress, breathing rate appears normal, no obvious gross SOB, gasping or wheezing  CV: no obvious cyanosis  MS: moves all visible extremities without noticeable abnormality  PSYCH/NEURO: pleasant and cooperative, no obvious depression or anxiety, speech and thought processing grossly intact  LABS: none today    Chemistry      Component Value Date/Time   NA 142 03/26/2022 1015   NA 141 06/05/2015 1315   K 3.5 03/26/2022 1015   K 3.9 06/05/2015 1315   CL 106 03/26/2022 1015   CL 103 06/05/2015 1315   CO2 28 03/26/2022 1015   CO2 28 06/05/2015 1315   BUN 15 03/26/2022 1015   BUN 14 06/05/2015 1315   CREATININE 0.78 03/26/2022 1015   CREATININE 0.87 02/03/2017 1523       Component Value Date/Time   CALCIUM 8.7 (L) 03/26/2022 1015   CALCIUM 9.0 06/05/2015 1315   ALKPHOS 65 03/26/2022 1015   ALKPHOS 84 06/05/2015 1315   AST 18 03/26/2022 1015   AST 23 06/05/2015 1315   ALT 9 03/26/2022 1015   ALT 17 06/05/2015 1315   BILITOT 0.3 03/26/2022 1015   BILITOT 1.00 06/05/2015 1315     Lab Results  Component Value Date   WBC 7.5 03/26/2022   HGB 12.5 03/26/2022   HCT 38.5 03/26/2022   MCV 90.0 03/26/2022   PLT 190 03/26/2022   Lab Results  Component Value Date   TSH 1.488 02/27/2022   Lab Results  Component Value Date   HGBA1C 5.3 02/27/2022   ASSESSMENT AND PLAN:  Discussed the following assessment and plan:  Recurrent major depressive disorder resistant to treatment. Generalized anxiety disorder.  Increase Lamictal to 200 mg a day. Continue alprazolam 0.5 3 times daily as needed. Continue Cymbalta 60 mg a day. Continue trazodone 50 mg, 2-4 qhs insomnia.   I discussed the assessment and treatment plan with the patient. The patient was provided an opportunity to ask questions and all were answered. The patient agreed with the plan and demonstrated an understanding of the instructions.   F/u: 3 mo RCI in person  Signed:  Crissie Sickles, MD           07/03/2022

## 2022-07-04 ENCOUNTER — Telehealth: Payer: Self-pay | Admitting: Family Medicine

## 2022-07-04 NOTE — Telephone Encounter (Signed)
Spoke with patient she stating she was having back surgery soon.  Req CB 08/2022

## 2022-07-24 DIAGNOSIS — H43813 Vitreous degeneration, bilateral: Secondary | ICD-10-CM | POA: Diagnosis not present

## 2022-07-24 DIAGNOSIS — H543 Unqualified visual loss, both eyes: Secondary | ICD-10-CM | POA: Diagnosis not present

## 2022-07-24 DIAGNOSIS — H35033 Hypertensive retinopathy, bilateral: Secondary | ICD-10-CM | POA: Diagnosis not present

## 2022-07-24 DIAGNOSIS — H18453 Nodular corneal degeneration, bilateral: Secondary | ICD-10-CM | POA: Diagnosis not present

## 2022-07-26 ENCOUNTER — Telehealth: Payer: Self-pay | Admitting: *Deleted

## 2022-07-26 DIAGNOSIS — M1711 Unilateral primary osteoarthritis, right knee: Secondary | ICD-10-CM

## 2022-07-26 DIAGNOSIS — M1712 Unilateral primary osteoarthritis, left knee: Secondary | ICD-10-CM

## 2022-07-26 NOTE — Telephone Encounter (Signed)
Mrs Ridings called to request refill on her oxycodone 10/325. She said Zella Ball said to give her about a weeks notice. It was last filled 07/02/22.

## 2022-07-29 ENCOUNTER — Other Ambulatory Visit: Payer: Self-pay | Admitting: Physical Medicine & Rehabilitation

## 2022-07-29 DIAGNOSIS — M1712 Unilateral primary osteoarthritis, left knee: Secondary | ICD-10-CM

## 2022-07-29 DIAGNOSIS — M1711 Unilateral primary osteoarthritis, right knee: Secondary | ICD-10-CM

## 2022-07-29 MED ORDER — OXYCODONE-ACETAMINOPHEN 10-325 MG PO TABS
1.0000 | ORAL_TABLET | Freq: Four times a day (QID) | ORAL | 0 refills | Status: DC | PRN
Start: 2022-07-29 — End: 2022-08-07

## 2022-07-29 NOTE — Telephone Encounter (Signed)
PMP was Reviewed,  Oxycodone e-scribed today.  Placed a call to Ms. Epping regarding the above, she verbalizes understanding.

## 2022-07-31 ENCOUNTER — Telehealth: Payer: Self-pay

## 2022-07-31 NOTE — Telephone Encounter (Signed)
Spoke with pt to schedule AWV in office. Patient declined to schedule wellness visit at this time.   

## 2022-08-07 ENCOUNTER — Encounter: Payer: Self-pay | Admitting: Physical Medicine & Rehabilitation

## 2022-08-07 ENCOUNTER — Encounter: Payer: Medicare Other | Attending: Physical Medicine & Rehabilitation | Admitting: Physical Medicine & Rehabilitation

## 2022-08-07 VITALS — Ht 64.0 in | Wt 130.8 lb

## 2022-08-07 DIAGNOSIS — M1712 Unilateral primary osteoarthritis, left knee: Secondary | ICD-10-CM | POA: Insufficient documentation

## 2022-08-07 DIAGNOSIS — Z79899 Other long term (current) drug therapy: Secondary | ICD-10-CM | POA: Insufficient documentation

## 2022-08-07 DIAGNOSIS — M1711 Unilateral primary osteoarthritis, right knee: Secondary | ICD-10-CM | POA: Diagnosis not present

## 2022-08-07 DIAGNOSIS — M170 Bilateral primary osteoarthritis of knee: Secondary | ICD-10-CM | POA: Diagnosis not present

## 2022-08-07 DIAGNOSIS — Z5181 Encounter for therapeutic drug level monitoring: Secondary | ICD-10-CM | POA: Insufficient documentation

## 2022-08-07 DIAGNOSIS — G894 Chronic pain syndrome: Secondary | ICD-10-CM | POA: Diagnosis not present

## 2022-08-07 MED ORDER — OXYCODONE-ACETAMINOPHEN 10-325 MG PO TABS
1.0000 | ORAL_TABLET | Freq: Four times a day (QID) | ORAL | 0 refills | Status: DC | PRN
Start: 2022-08-07 — End: 2022-09-25

## 2022-08-07 MED ORDER — DICLOFENAC SODIUM 1 % EX GEL: 2.0000 g | Freq: Four times a day (QID) | CUTANEOUS | 4 refills | Status: DC

## 2022-08-07 NOTE — Progress Notes (Signed)
PROCEDURE NOTE  DIAGNOSIS:  oa bilateral knees  INTERVENTION:   ZILRETTA INJECTION     After informed consent and preparation of the skin with betadine and isopropyl alcohol, I injected '32MG'$  of zilretta dissolved into 5cc of diluent into bilateral knees via ANTEROMEDIAL  approach. Contents of syring were shaken vigorously and aspiration was performed prior to injection. The patient tolerated well, and no complications were encountered. Afterward the area was cleaned and dressed. Post- injection instructions were provided including ice  if swelling or pain should occur.    Meredith Staggers, MD, [facility] 08/07/2022

## 2022-08-07 NOTE — Patient Instructions (Signed)
PLEASE FEEL FREE TO CALL OUR OFFICE WITH ANY PROBLEMS OR QUESTIONS ([phone])      

## 2022-08-09 ENCOUNTER — Other Ambulatory Visit: Payer: Self-pay | Admitting: Family Medicine

## 2022-08-09 NOTE — Telephone Encounter (Signed)
Requesting: alprazolam Contract: 12/28/20 UDS: 08/07/22, [place] office Last Visit: 07/03/22 Next Visit: 3 month RCI in person advised Last Refill: 07/03/22(90,0)  Please Advise. Medication pending

## 2022-08-11 LAB — DRUG TOX MONITOR 1 W/CONF, ORAL FLD
Alprazolam: 1.36 ng/mL — ABNORMAL HIGH (ref <0.50)
Amphetamines: NEGATIVE ng/mL (ref <10)
Barbiturates: NEGATIVE ng/mL (ref <10)
Benzodiazepines: POSITIVE ng/mL — AB (ref <0.50)
Buprenorphine: NEGATIVE ng/mL (ref <0.10)
Chlordiazepoxide: NEGATIVE ng/mL (ref <0.50)
Clonazepam: NEGATIVE ng/mL (ref <0.50)
Cocaine: NEGATIVE ng/mL (ref <5.0)
Codeine: NEGATIVE ng/mL (ref <2.5)
Diazepam: NEGATIVE ng/mL (ref <0.50)
Dihydrocodeine: NEGATIVE ng/mL (ref <2.5)
Fentanyl: NEGATIVE ng/mL (ref <0.10)
Flunitrazepam: NEGATIVE ng/mL (ref <0.50)
Flurazepam: NEGATIVE ng/mL (ref <0.50)
Heroin Metabolite: NEGATIVE ng/mL (ref <1.0)
Hydrocodone: NEGATIVE ng/mL (ref <2.5)
Hydromorphone: NEGATIVE ng/mL (ref <2.5)
Lorazepam: NEGATIVE ng/mL (ref <0.50)
MARIJUANA: NEGATIVE ng/mL (ref <2.5)
MDMA: NEGATIVE ng/mL (ref <10)
Meprobamate: NEGATIVE ng/mL (ref <2.5)
Methadone: NEGATIVE ng/mL (ref <5.0)
Midazolam: NEGATIVE ng/mL (ref <0.50)
Morphine: NEGATIVE ng/mL (ref <2.5)
Nicotine Metabolite: NEGATIVE ng/mL (ref <5.0)
Nordiazepam: NEGATIVE ng/mL (ref <0.50)
Norhydrocodone: NEGATIVE ng/mL (ref <2.5)
Noroxycodone: 72.6 ng/mL — ABNORMAL HIGH (ref <2.5)
Opiates: POSITIVE ng/mL — AB (ref <2.5)
Oxazepam: NEGATIVE ng/mL (ref <0.50)
Oxycodone: 102.8 ng/mL — ABNORMAL HIGH (ref <2.5)
Oxymorphone: NEGATIVE ng/mL (ref <2.5)
Phencyclidine: NEGATIVE ng/mL (ref <10)
Tapentadol: NEGATIVE ng/mL (ref <5.0)
Temazepam: NEGATIVE ng/mL (ref <0.50)
Tramadol: NEGATIVE ng/mL (ref <5.0)
Triazolam: NEGATIVE ng/mL (ref <0.50)
Zolpidem: NEGATIVE ng/mL (ref <5.0)

## 2022-08-11 LAB — DRUG TOX ALC METAB W/CON, ORAL FLD: Alcohol Metabolite: NEGATIVE ng/mL (ref <25)

## 2022-08-13 ENCOUNTER — Telehealth: Payer: Self-pay | Admitting: *Deleted

## 2022-08-13 NOTE — Telephone Encounter (Signed)
Oral swab drug screen was consistent for prescribed medications.  ?

## 2022-08-29 ENCOUNTER — Telehealth: Payer: Self-pay

## 2022-08-29 NOTE — Telephone Encounter (Signed)
Spoke with pt to schedule AWV in office. Patient declined to schedule wellness visit at this time.   

## 2022-09-03 DIAGNOSIS — S22080D Wedge compression fracture of T11-T12 vertebra, subsequent encounter for fracture with routine healing: Secondary | ICD-10-CM | POA: Diagnosis not present

## 2022-09-10 ENCOUNTER — Telehealth: Payer: Self-pay | Admitting: Family Medicine

## 2022-09-10 NOTE — Telephone Encounter (Signed)
Left message for patient to schedule Annual Wellness Visit.  Please schedule (telephone/video call) with Nurse Health Advisor Tina Betterson, RN at [place] [facility] [facility]. Please call [phone] ask for Kathy 

## 2022-09-23 ENCOUNTER — Telehealth: Payer: Self-pay | Admitting: Family Medicine

## 2022-09-23 NOTE — Telephone Encounter (Signed)
Called patient to sched AWV. She was unable to hear me on the phone.    Called patient to schedule Annual Wellness Visit.  Please schedule (telephone/video call) with Nurse Health Advisor Charlott Rakes, RN at [facility]. Please call [phone] ask for [facility]

## 2022-09-23 NOTE — Telephone Encounter (Signed)
Spoke with patient she stated she was moving and req a CB 10/2022

## 2022-09-25 ENCOUNTER — Other Ambulatory Visit: Payer: Self-pay | Admitting: Physical Medicine & Rehabilitation

## 2022-09-25 ENCOUNTER — Other Ambulatory Visit: Payer: Self-pay | Admitting: Registered Nurse

## 2022-09-25 ENCOUNTER — Telehealth: Payer: Self-pay | Admitting: *Deleted

## 2022-09-25 ENCOUNTER — Other Ambulatory Visit: Payer: Self-pay | Admitting: Family Medicine

## 2022-09-25 DIAGNOSIS — M1712 Unilateral primary osteoarthritis, left knee: Secondary | ICD-10-CM

## 2022-09-25 DIAGNOSIS — M1711 Unilateral primary osteoarthritis, right knee: Secondary | ICD-10-CM

## 2022-09-25 MED ORDER — OXYCODONE-ACETAMINOPHEN 10-325 MG PO TABS
1.0000 | ORAL_TABLET | Freq: Four times a day (QID) | ORAL | 0 refills | Status: DC | PRN
Start: 2022-09-25 — End: 2022-09-27

## 2022-09-25 NOTE — Telephone Encounter (Signed)
Rx written and sent to the pharmacy. Thanks!  

## 2022-09-25 NOTE — Telephone Encounter (Signed)
Okay make sure patient is not taking oxybutynin. (oxybutynin and Solifenacin are both for bladder incontinence--> these are both on her med list.  Insurance often dictates which one we use with her, hence the confusion. Me know

## 2022-09-25 NOTE — Telephone Encounter (Signed)
Please fill, if appropriate.  

## 2022-09-25 NOTE — Telephone Encounter (Signed)
Calling for her refill on oxycodone acetaminophen.

## 2022-09-26 NOTE — Telephone Encounter (Signed)
Vevelyn Royals, did you see my question about this prescription?

## 2022-09-26 NOTE — Telephone Encounter (Signed)
Confirmed with patient, she is not taking the oxybutynin and prefers vesicare for incontinence issues.

## 2022-09-26 NOTE — Telephone Encounter (Signed)
Great. Thank you Vesicare rx sent

## 2022-09-27 ENCOUNTER — Telehealth: Payer: Self-pay

## 2022-09-27 DIAGNOSIS — M25511 Pain in right shoulder: Secondary | ICD-10-CM | POA: Diagnosis not present

## 2022-09-27 DIAGNOSIS — M25552 Pain in left hip: Secondary | ICD-10-CM | POA: Diagnosis not present

## 2022-09-27 DIAGNOSIS — M1712 Unilateral primary osteoarthritis, left knee: Secondary | ICD-10-CM

## 2022-09-27 DIAGNOSIS — M1711 Unilateral primary osteoarthritis, right knee: Secondary | ICD-10-CM

## 2022-09-27 DIAGNOSIS — M545 Low back pain, unspecified: Secondary | ICD-10-CM | POA: Diagnosis not present

## 2022-09-27 DIAGNOSIS — M542 Cervicalgia: Secondary | ICD-10-CM | POA: Diagnosis not present

## 2022-09-27 MED ORDER — OXYCODONE-ACETAMINOPHEN 10-325 MG PO TABS: 1.0000 | ORAL_TABLET | Freq: Four times a day (QID) | ORAL | 0 refills | Status: DC | PRN

## 2022-09-27 NOTE — Telephone Encounter (Signed)
Pharmacy called and stated they do not have the Oxycodone 10-325 in stock. It was cancelled at [facility]. Please send to [facility] [address]

## 2022-09-27 NOTE — Telephone Encounter (Signed)
Pt advised refill sent. °

## 2022-09-27 NOTE — Telephone Encounter (Signed)
Rx written and sent to the pharmacy. Thanks!  

## 2022-10-09 ENCOUNTER — Encounter: Payer: Self-pay | Admitting: Registered Nurse

## 2022-10-09 ENCOUNTER — Encounter: Payer: Medicare Other | Attending: Physical Medicine & Rehabilitation | Admitting: Registered Nurse

## 2022-10-09 VITALS — BP 118/66 | HR 74 | Ht 64.0 in | Wt 128.0 lb

## 2022-10-09 DIAGNOSIS — M542 Cervicalgia: Secondary | ICD-10-CM

## 2022-10-09 DIAGNOSIS — R102 Pelvic and perineal pain unspecified side: Secondary | ICD-10-CM

## 2022-10-09 DIAGNOSIS — W19XXXD Unspecified fall, subsequent encounter: Secondary | ICD-10-CM | POA: Insufficient documentation

## 2022-10-09 DIAGNOSIS — Z5181 Encounter for therapeutic drug level monitoring: Secondary | ICD-10-CM | POA: Diagnosis not present

## 2022-10-09 DIAGNOSIS — M5412 Radiculopathy, cervical region: Secondary | ICD-10-CM | POA: Diagnosis not present

## 2022-10-09 DIAGNOSIS — M5416 Radiculopathy, lumbar region: Secondary | ICD-10-CM

## 2022-10-09 DIAGNOSIS — G8929 Other chronic pain: Secondary | ICD-10-CM | POA: Diagnosis not present

## 2022-10-09 DIAGNOSIS — G894 Chronic pain syndrome: Secondary | ICD-10-CM

## 2022-10-09 DIAGNOSIS — M1712 Unilateral primary osteoarthritis, left knee: Secondary | ICD-10-CM | POA: Diagnosis not present

## 2022-10-09 DIAGNOSIS — I2510 Atherosclerotic heart disease of native coronary artery without angina pectoris: Secondary | ICD-10-CM

## 2022-10-09 DIAGNOSIS — M546 Pain in thoracic spine: Secondary | ICD-10-CM | POA: Diagnosis not present

## 2022-10-09 DIAGNOSIS — M1711 Unilateral primary osteoarthritis, right knee: Secondary | ICD-10-CM

## 2022-10-09 DIAGNOSIS — Y92009 Unspecified place in unspecified non-institutional (private) residence as the place of occurrence of the external cause: Secondary | ICD-10-CM | POA: Diagnosis not present

## 2022-10-09 DIAGNOSIS — Z79899 Other long term (current) drug therapy: Secondary | ICD-10-CM | POA: Diagnosis not present

## 2022-10-09 MED ORDER — OXYCODONE-ACETAMINOPHEN 10-325 MG PO TABS: 1.0000 | ORAL_TABLET | Freq: Four times a day (QID) | ORAL | 0 refills | Status: DC | PRN

## 2022-10-09 NOTE — Progress Notes (Signed)
Subjective:    Patient ID: Linda [name], female    DOB: Sep 17, 1946, 76 y.o.   MRN: [id number]  HPI: Linda [name] is a 76 y.o. female who returns for follow up appointment for chronic pain and medication refill. She states her  pain is located in her neck radiating into her right shoulder, lower back pain. She also reports left pelvic and Left hip since her fall three weeks ago. She states she was in the  process of moving from [place] [employer] to [facility] and while in the hallway at [facility],  a care giver bumped her in the hallway and she fell against the wall and landed on her buttocks. She states she was seen at [facility], and X-rays was ordered. Will ask April to obtain Note and X-ray reports. Linda [name] also complaining of cervical radiculitis since the fall, she wasn't sure if she had cervical X-ray at Urgent Care . X-rays were ordered today, she verbalizes understanding. She was educated on Falls Prevention and she verbalizes understanding.   She rates her pain 7. Her current exercise regime is walking with her walker.   Linda [name] is 60.00 MME. She  is also prescribed Alprazolam  by Dr Anitra Lauth .We have discussed the black box warning of using opioids and benzodiazepines. I highlighted the dangers of using these drugs together and discussed the adverse events including respiratory suppression, overdose, cognitive impairment and importance of compliance with current regimen. We will continue to monitor and adjust as indicated.   Last oral swab was performed on 08/07/2022, it was consistent.      Pain Inventory Average Pain 7 Pain Right Now 7 My pain is constant, sharp, burning, dull, stabbing, tingling, and aching  In the last 24 hours, has pain interfered with the following? General activity 9 Relation with others 9 Enjoyment of life 9 What TIME of day is your pain at its worst? daytime, evening, and night Sleep (in  general) Fair  Pain is worse with: walking, bending, standing, and some activites Pain improves with: rest, pacing activities, medication, and injections Relief from Meds: 5  Family History  Problem Relation Age of Onset   Arthritis Mother    Kidney disease Mother    Heart disease Father    Stroke Father    Hypertension Father    Diabetes Father    Heart attack Father    Heart attack Paternal Grandmother    Diabetes Sister        one sister   Hypertension Sister    Asthma Sister    Hypertension Brother    Asthma Brother    Asthma Daughter    Multiple sclerosis Son    Social History   Socioeconomic History   Marital status: Widowed    Spouse name: Not on file   Number of children: 2   Years of education: Not on file   Highest education level: Not on file  Occupational History   Occupation: Retired    Comment: Pharmacist, 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  Relocated to [place] 09/2012 to be closer to her son who has MS.   Husband d 2015--mesothelioma.   Occupation: former Pharmacist, hospital.   Education: masters degree level.   No T/A/Ds.   Lives in [place], independent living.   Social Determinants of Health   Financial Resource Strain: Low Risk  (07/04/2021)   Overall Financial Resource Strain (CARDIA)    Difficulty of Paying Living Expenses: Not hard at all  Food Insecurity: No Food Insecurity (07/04/2021)   Hunger Vital Sign    Worried About Running Out of Food in the Last Year: Never true    Ran Out of Food in the Last Year: Never true  Transportation Needs: No Transportation Needs (07/04/2021)   PRAPARE - Armed forces logistics/support/administrative officer (Medical): No    Lack of Transportation (Non-Medical): No  Physical Activity: Insufficiently Active (07/04/2021)   Exercise Vital Sign    Days of Exercise per Week: 2 days    Minutes of Exercise per Session: 20 min  Stress: No Stress Concern Present (07/04/2021)   [place]    Feeling of Stress : Not at all  Social Connections: Moderately Isolated (07/04/2021)   Social Connection and Isolation Panel [NHANES]    Frequency of Communication with Friends and Family: Three times a week    Frequency of Social Gatherings with Friends and Family: More than three times a week    Attends Religious Services: More than 4 times per year    Active Member of Clubs or Organizations: No    Attends Archivist Meetings: Never    Marital Status: Widowed   Past Surgical History:  Procedure Laterality Date   APPENDECTOMY  1960   AXILLARY SURGERY Left 1978   Multiple "lump" in armpit per pt   BIOPSY  06/17/2020   Gastric bx->gastritis, h pylori neg.  Procedure: BIOPSY;  Surgeon: Carol Ada, MD;  Location: [facility];  Service: Endoscopy;;   CARDIAC CATHETERIZATION  01/2013   nonobstructive CAD, EF 55-60%   CARDIOVASCULAR STRESS TEST  02/22/2015   Low risk myocard perf imaging; wall motion normal, normal EF   carotid duplex doppler  10/21/2017   R vertebral flow suggestive of possible distal obstruction.  Pt declined further w/u as of 10/29/17 but need to revisit this problem periodically.   COCCYX REMOVAL  1972   DEXA  06/05/2017   T-score -3.1   DILATION AND CURETTAGE OF UTERUS  ? 1970's   ESOPHAGOGASTRODUODENOSCOPY N/A 07/19/2014   Gastritis found + in the setting of supratherapeutic INR, +plavix, + meloxicam.   ESOPHAGOGASTRODUODENOSCOPY (EGD) WITH PROPOFOL N/A 06/17/2020   NO stricture or other prob to explain pt's dysphagia, dilation was done anyway.  Gastric bx's-->gastritis, h pylori neg.  Procedure: ESOPHAGOGASTRODUODENOSCOPY (EGD) WITH PROPOFOL;  Surgeon: Carol Ada, MD;  [facility];  Service: Endoscopy;  Laterality: N/A;   EYE SURGERY Left 2012-2013   "injections for ~ 1 yr; don't really know what for" (07/12/2013)   HEEL SPUR SURGERY Left 2008   kidney stone removal Right    KNEE SURGERY  2005   LEFT HEART CATHETERIZATION WITH CORONARY ANGIOGRAM N/A 01/30/2013   Procedure: LEFT HEART CATHETERIZATION WITH CORONARY ANGIOGRAM;  Surgeon: Clent Demark, MD;  Location: [facility];  Service: Cardiovascular;  Laterality: N/A;   MALONEY DILATION  06/17/2020   Procedure: Venia Minks DILATION;  Surgeon: Carol Ada, MD;  [facility];  Service: Endoscopy;;  PLANTAR FASCIA RELEASE Left 2008   SPIROMETRY  04/25/2014   In hosp for acute asthma/COPD flare: mixed obstructive and restrictive lung disease. The FEV1 is severely reduced at 45% predicted.  FEV1 signif decreased compared to prior spirometry 07/23/13.   TENDON RELEASE  1996   Right forearm and hand   TOTAL ABDOMINAL HYSTERECTOMY  1974   TRANSTHORACIC ECHOCARDIOGRAM  01/2013; 04/2014;08/2015; 09/2017   2014--NORMAL.  2015--focal basal septal hypertrophy, EF 55-60%, grade I diast dysfxn, mild LAE.  08/2015 EF 55-60%, nl LV syst fxn, grade I DD, valves wnl. 10/21/17: EF 65-70%, grd I DD, o/w normal. 02/17/22 EF 70-75%, hyperdynamic LV fxn, grd I DD.   Past Surgical History:  Procedure Laterality Date   APPENDECTOMY  1960   AXILLARY SURGERY Left 1978   Multiple "lump" in armpit per pt   BIOPSY  06/17/2020   Gastric bx->gastritis, h pylori neg.  Procedure: BIOPSY;  Surgeon: Carol Ada, MD;  [facility];  Service: Endoscopy;;   CARDIAC CATHETERIZATION  01/2013   nonobstructive CAD, EF 55-60%   CARDIOVASCULAR STRESS TEST  02/22/2015   Low risk myocard perf imaging; wall motion normal, normal EF   carotid duplex doppler  10/21/2017   R vertebral flow suggestive of possible distal obstruction.  Pt  declined further w/u as of 10/29/17 but need to revisit this problem periodically.   COCCYX REMOVAL  1972   DEXA  06/05/2017   T-score -3.1   DILATION AND CURETTAGE OF UTERUS  ? 1970's   ESOPHAGOGASTRODUODENOSCOPY N/A 07/19/2014   Gastritis found + in the setting of supratherapeutic INR, +plavix, + meloxicam.   ESOPHAGOGASTRODUODENOSCOPY (EGD) WITH PROPOFOL N/A 06/17/2020   NO stricture or other prob to explain pt's dysphagia, dilation was done anyway.  Gastric bx's-->gastritis, h pylori neg. Procedure: ESOPHAGOGASTRODUODENOSCOPY (EGD) WITH PROPOFOL;  Surgeon: Carol Ada, MD;  [facility];  Service: Endoscopy;  Laterality: N/A;   EYE SURGERY Left 2012-2013   "injections for ~ 1 yr; don't really know what for" (07/12/2013)   HEEL SPUR SURGERY Left 2008   kidney stone removal Right    KNEE SURGERY  2005   LEFT HEART CATHETERIZATION WITH CORONARY ANGIOGRAM N/A 01/30/2013   Procedure: LEFT HEART CATHETERIZATION WITH CORONARY ANGIOGRAM;  Surgeon: Clent Demark, MD;  Location: [facility];  Service: Cardiovascular;  Laterality: N/A;   MALONEY DILATION  06/17/2020   Procedure: Venia Minks DILATION;  Surgeon: Carol Ada, MD;  [facility];  Service: Endoscopy;;   PLANTAR FASCIA RELEASE Left 2008   SPIROMETRY  04/25/2014   In hosp for acute asthma/COPD flare: mixed obstructive and restrictive lung disease. The FEV1 is severely reduced at 45% predicted.  FEV1 signif decreased compared to prior spirometry 07/23/13.   TENDON RELEASE  1996   Right forearm and hand   TOTAL ABDOMINAL HYSTERECTOMY  1974   TRANSTHORACIC ECHOCARDIOGRAM  01/2013; 04/2014;08/2015; 09/2017   2014--NORMAL.  2015--focal basal septal hypertrophy, EF 55-60%, grade I diast dysfxn, mild LAE.  08/2015 EF 55-60%, nl LV syst fxn, grade I DD, valves wnl. 10/21/17: EF 65-70%, grd I DD, o/w normal. 02/17/22 EF 70-75%, hyperdynamic LV fxn, grd I DD.   Past Medical History:  Diagnosis Date   Acute upper GI bleed 06/2014    while pt taking coumadin, plavix, and meloxicam---despite being told not to take coumadin.   Anginal pain ([place])    Nonobstructive CAD 2014; however, her cardiologist put her on a statin for this and NOT for hyperlipidemia per pt report.  Atyp CP 08/2017 at card f/u, plan for myoc perf imaging.   Anxiety    panic attacks   Asthma    w/ asbestososis    BPPV (benign paroxysmal positional vertigo) 12/16/2012   Chronic diastolic CHF (congestive heart failure) (HCC)    dry wt as of 11/06/16 is 168 lbs.   Chronic lower back pain    COPD (chronic obstructive pulmonary disease) (HCC)    DDD (degenerative disc disease)    lumbar and cervical.    Diverticular disease    Fibromyalgia    Patient states dx was around her late 21s but she had sx's for years prior to this.   H/O hiatal hernia    History of pneumonia    hospitalized 12/2011, 02/2013, and 07/2013 [facility]) for this   HTN (hypertension)    Renal artery dopplers 04/2013 neg for stenosis.   Hypervitaminosis D 09/27/2019   over-supplemented.  Stopped vit D and plan recheck 2 mo.   Idiopathic angio-edema-urticaria 72014; 2021   Angioedema component was very minimal.  2021->Dr. Bobbitt (allergist) eval.   Insomnia    Iron deficiency anemia    [place]emat[place] in [place], [place] did extensive w/u; no cause found; failed oral supplement;; gets fairly regular (q34mor so) IV iron infusions (Venofer -iron sucrose- '200mg'$  with procrit.  "for 14 yr I've been getting blood work q month & getting infusions prn" (07/12/2013).  Dr. EMarin Olplocally, iron infusions done, EPO deficiency dx'd   Kidney stone    Migraine syndrome    "not as often anymore; used to be ~ q wk" (07/12/2013)   Mixed incontinence urge and stress    Nephrolithiasis    "passed all on my own or they are still in there" (07/12/2013)   Neuroleptic induced parkinsonism (HCC) 2018   Dr. TCarles Collet neuro, saw her 11/24/17 and recommended d/c of abilify as first step.  D/c'd abilify and pt got complete  recovery.   Oropharyngeal dysphagia    swallowing study speech path 05/2020. Gastric bx's showed gastritis, h pylori NEG   OSA on CPAP    prior to move to --had another sleep study 10/2015 w/pulm Dr. ECamillo Flaming   Osteoarthritis    "severe; progressing fast" (07/12/2013); multiple joints-not surgical candidate for TKR (03/2015).  Triamcinolon knee injections by Dr. STessa Lerner1/2019.   Pernicious anemia 08/24/2014   Pleural plaque with presence of asbestos 07/22/2013   [facility]ulmonary embolism [facility] 07/2013   [facility]: pt took coumadin for about 8-9 mo   Pyelonephritis    "several times over the last 30 yr" (07/12/2013)   RBBB (right bundle branch block)    [place] major [place] ([place]    Recurrent UTI    hx of hospitalization for pyelonephritis; started abx prophylaxis 06/2015   Syncope    Hypotensive; ED visit--Dr. HTerrence Dupontdid Cath--nonobstructive CAD, EF 55-60%.  In retrospect, suspect pt rx med misuse/polypharmacy   Vertebral compression fracture ([place]    Acute T12 on 02/27/22 (fall).  multiple old thoracic-->neurosurg to do MRI as of 05/2022   There were no vitals taken for this visit.  Opioid Risk Score:   Fall Risk Score:  `1  Depression screen PHQ 2/9     08/07/2022    1:49 PM 06/19/2022    1:10 PM 04/01/2022   12:00 PM 03/07/2022    2:49 PM 02/08/2022    1:58 PM 01/11/2022    1:05 PM 10/18/2021   10:00 AM  Depression screen  PHQ 2/9  Decreased Interest 0 3 2 0 1 0 1  Down, Depressed, Hopeless 0 2 0 0 1 0 1  PHQ - 2 Score 0 5 2 0 2 0 2  Altered sleeping  0    2   Tired, decreased energy  3    3   Change in appetite  2    0   Feeling bad or failure about yourself   0    0   Trouble concentrating  2    0   Moving slowly or fidgety/restless      0   Suicidal thoughts  1    0   PHQ-9 Score  13    5   Difficult doing work/chores  Somewhat difficult    Not difficult at all     Review of Systems  Musculoskeletal:  Positive for gait  problem.       Right shoulder pain, left groin pain & pain in both knees  All other systems reviewed and are negative.      Objective:   Physical Exam Vitals and nursing note reviewed.  Constitutional:      Appearance: Normal appearance.  Cardiovascular:     Rate and Rhythm: Normal rate and regular rhythm.     Pulses: Normal pulses.     Heart sounds: Normal heart sounds.  Pulmonary:     Effort: Pulmonary effort is normal.     Breath sounds: Normal breath sounds.  Musculoskeletal:     Cervical back: Normal range of motion and neck supple.     Comments: Normal Muscle Bulk and Muscle Testing Reveals:  Upper Extremities: Full ROM and Muscle Strength 5/5 Right AC Joint Tenderness Lumbar Paraspinal Tenderness: L-4-L-5 Left Greater Trochanter Tenderness Lower Extremities: Decreased ROM and Muscle Strength 4/5 Bilateral Lower Extremities Flexion Produces Pain into her Bilateral Patella's Arises from Table Slowly using walker for support Antalgic  Gait     Skin:    General: Skin is warm and dry.     Comments: Left Hip Ecchymosis resolving  Neurological:     Mental Status: She is alert and oriented to person, place, and time.  Psychiatric:        Mood and Affect: Mood normal.        Behavior: Behavior normal.         Assessment & Plan:  Cervicalgia/ Cervical Radiculitis: S/P Fall 3 weeks ago: RX: Cervical X-ray. Linda [name] understanding. Continue to Monitor.  Left Pelvic Pain/ Left Hip Pain: Since Fall 3 weeks Ago: She went to [facility] and X-rays were ordered she reports. Will have April call [facility] to obtain X-ray results and Note. She verbalizes understanding. RX: Left Hip and Pelvic X-ray ordered. Continue to Monitor.  3. Functional deficits secondary to Gait disorder:Continue with HEP. 10/09/2022. 4. Chronic Back pain/ Lumbar Radiculitis/fibromyalgia /R>L Knee OA Pain Management: 10/09/2022 Continue: Oxycodone 10/325 mg one tablet every 6 hours #120.   Second script sent for the following month.  Continue Voltaren Gel. We will continue the opioid monitoring program, this consists of regular clinic visits, examinations, urine drug screen, pill counts as well as use of [place] Controlled Substance Reporting system. A 12 month History has been reviewed on the [place] Controlled Substance Reporting System on 10/09/2022 5. Depression with anxiety/Grief reaction/Mood: Continue Xanax, Trazodone and Cymbalta . PCP Following. 10/09/2022 6. [name] with asthma:. Albuterol prn. :Pulmonology Following. 10/09/2022 7.. Bilateral Osteoarthritis of Bilateral Knees: Linda [name] scheduled  with [name] for Bilateral Zilretta Injection with [name], she verbalizes understanding.Continue to Monitor. 010/18/2023 8. Fibromyalgia: Continue Home exercise regimen as tolerated. Continue to Monitor.10/09/2022 9. Left  Greater Trochanteric Bursitis: Continue to  Alternate Ice and Heat Therapy. Continue to Monitor. 10/09/2022. 10. Chronic Bilateral Shoulder Pain:  No complaints  Today. Continue HEP as Tolerated. Continue current medication regimen. Continue to Monitor. 10/09/2022 11. Oxygen Desaturation: Linda [name] wears oxygen at home she reports. She will F/U with her Pulmonologist. She verbalizes understanding.   10/09/2022 12. S/P Fall at Home Subsequent encounter: 3 weeks ago/ She states she was seen at [facility] and X-rays were ordered. April will call to obtain note and X-rays: She verbalizes understanding. Educated on Falls Prevention: She verbalizes understanding. Continue to Monitor.   F/U in 2 months

## 2022-10-09 NOTE — Patient Instructions (Signed)
My- Chart:   [phone]  

## 2022-10-16 DIAGNOSIS — H40003 Preglaucoma, unspecified, bilateral: Secondary | ICD-10-CM | POA: Diagnosis not present

## 2022-10-17 ENCOUNTER — Telehealth: Payer: Self-pay | Admitting: Physical Medicine & Rehabilitation

## 2022-10-17 NOTE — Telephone Encounter (Signed)
During last visit x-rays were ordered for hip, neck and pelvis. Patient is needing to know if she can these done at anytime all at once.  Please advise

## 2022-10-18 ENCOUNTER — Other Ambulatory Visit: Payer: Self-pay | Admitting: Ophthalmology

## 2022-10-21 ENCOUNTER — Other Ambulatory Visit: Payer: Self-pay | Admitting: Ophthalmology

## 2022-10-21 ENCOUNTER — Other Ambulatory Visit: Payer: Self-pay | Admitting: Cardiology

## 2022-10-21 DIAGNOSIS — H53133 Sudden visual loss, bilateral: Secondary | ICD-10-CM

## 2022-10-28 ENCOUNTER — Telehealth: Payer: Self-pay | Admitting: Registered Nurse

## 2022-10-28 DIAGNOSIS — M1712 Unilateral primary osteoarthritis, left knee: Secondary | ICD-10-CM

## 2022-10-28 DIAGNOSIS — M1711 Unilateral primary osteoarthritis, right knee: Secondary | ICD-10-CM

## 2022-10-28 MED ORDER — OXYCODONE-ACETAMINOPHEN 10-325 MG PO TABS: 1.0000 | ORAL_TABLET | Freq: Four times a day (QID) | ORAL | 0 refills | Status: DC | PRN

## 2022-10-28 NOTE — Telephone Encounter (Signed)
Patient requesting refill for Oxycodone please send to [facility] [phone]

## 2022-10-28 NOTE — Telephone Encounter (Signed)
PMP was Reviewed.  Oxycodone e-scribed to the pharmacy . Linda [name] was called regarding the above.  She verbalizes understanding.

## 2022-11-04 ENCOUNTER — Telehealth: Payer: Self-pay | Admitting: Family Medicine

## 2022-11-04 NOTE — Telephone Encounter (Signed)
Left message for patient to schedule Annual Wellness Visit.  Please schedule (telephone/video call) with Nurse Health Advisor Tina Betterson, RN at [facility]. Please call [phone] ask for Kathy 

## 2022-11-12 ENCOUNTER — Ambulatory Visit
Admission: RE | Admit: 2022-11-12 | Discharge: 2022-11-12 | Disposition: A | Discharge status: Home / Self Care | Payer: Medicare Other | Source: Ambulatory Visit | Attending: Registered Nurse | Admitting: Registered Nurse

## 2022-11-12 ENCOUNTER — Ambulatory Visit
Admission: RE | Admit: 2022-11-12 | Discharge: 2022-11-12 | Disposition: A | Discharge status: Home / Self Care | Payer: Medicare Other | Source: Ambulatory Visit | Attending: Ophthalmology | Admitting: Ophthalmology

## 2022-11-12 DIAGNOSIS — M47812 Spondylosis without myelopathy or radiculopathy, cervical region: Secondary | ICD-10-CM | POA: Diagnosis not present

## 2022-11-12 DIAGNOSIS — M542 Cervicalgia: Secondary | ICD-10-CM | POA: Diagnosis not present

## 2022-11-12 DIAGNOSIS — H53133 Sudden visual loss, bilateral: Secondary | ICD-10-CM

## 2022-11-12 DIAGNOSIS — M25552 Pain in left hip: Secondary | ICD-10-CM | POA: Diagnosis not present

## 2022-11-12 MED ORDER — GADOPICLENOL 0.5 MMOL/ML IV SOLN
6.0000 mL | Freq: Once | INTRAVENOUS | Status: AC | PRN
  Administered 2022-11-12: 6 mL via INTRAVENOUS

## 2022-11-13 ENCOUNTER — Encounter: Payer: Self-pay | Admitting: Physical Medicine & Rehabilitation

## 2022-11-13 ENCOUNTER — Encounter: Payer: Medicare Other | Attending: Physical Medicine & Rehabilitation | Admitting: Physical Medicine & Rehabilitation

## 2022-11-13 VITALS — BP 181/98 | HR 70 | Ht 64.0 in | Wt 135.0 lb

## 2022-11-13 DIAGNOSIS — M25561 Pain in right knee: Secondary | ICD-10-CM | POA: Insufficient documentation

## 2022-11-13 DIAGNOSIS — M1711 Unilateral primary osteoarthritis, right knee: Secondary | ICD-10-CM | POA: Diagnosis not present

## 2022-11-13 DIAGNOSIS — G8929 Other chronic pain: Secondary | ICD-10-CM | POA: Insufficient documentation

## 2022-11-13 DIAGNOSIS — M25562 Pain in left knee: Secondary | ICD-10-CM | POA: Diagnosis not present

## 2022-11-13 DIAGNOSIS — M1712 Unilateral primary osteoarthritis, left knee: Secondary | ICD-10-CM | POA: Insufficient documentation

## 2022-11-13 MED ORDER — METHOCARBAMOL 500 MG PO TABS: 500.0000 mg | ORAL_TABLET | Freq: Four times a day (QID) | ORAL | 2 refills | Status: DC | PRN

## 2022-11-13 MED ORDER — OXYCODONE-ACETAMINOPHEN 10-325 MG PO TABS: 1.0000 | ORAL_TABLET | Freq: Four times a day (QID) | ORAL | 0 refills | Status: DC | PRN

## 2022-11-13 MED ORDER — TRIAMCINOLONE ACETONIDE 32 MG IX SRER
64.0000 mg | Freq: Once | INTRA_ARTICULAR | Status: AC
  Administered 2022-11-13: 64 mg via INTRA_ARTICULAR

## 2022-11-13 NOTE — Progress Notes (Signed)
PROCEDURE NOTE  DIAGNOSIS:  OA Bilateral knees  INTERVENTION:   ZILRETTA INJECTION     After informed consent and preparation of the skin with betadine and isopropyl alcohol, I injected '32MG'$  of zilretta dissolved into 5cc of diluent into the bilateral knees via anterolateral approach. Contents of syring were shaken vigorously and aspiration was performed prior to injection. The patient tolerated well, and no complications were encountered. Afterward the area was cleaned and dressed. Post- injection instructions were provided including ice  if swelling or pain should occur.    Will try robaxin in place of flexeril for muscle spasms in neck  Meredith Staggers, MD, [facility] 11/13/2022

## 2022-11-13 NOTE — Patient Instructions (Signed)
ALWAYS FEEL FREE TO CALL OUR OFFICE WITH ANY PROBLEMS OR QUESTIONS ([phone])  **PLEASE NOTE** ALL MEDICATION REFILL REQUESTS (INCLUDING CONTROLLED SUBSTANCES) NEED TO BE MADE AT LEAST 7 DAYS PRIOR TO REFILL BEING DUE. ANY REFILL REQUESTS INSIDE THAT TIME FRAME MAY RESULT IN DELAYS IN RECEIVING YOUR PRESCRIPTION.                    

## 2022-11-13 NOTE — Addendum Note (Signed)
Addended by: Jasmine December T on: 11/13/2022 03:14 PM   Modules accepted: Orders

## 2022-11-20 DIAGNOSIS — H53133 Sudden visual loss, bilateral: Secondary | ICD-10-CM | POA: Diagnosis not present

## 2022-11-20 DIAGNOSIS — H40003 Preglaucoma, unspecified, bilateral: Secondary | ICD-10-CM | POA: Diagnosis not present

## 2022-12-05 ENCOUNTER — Other Ambulatory Visit: Payer: Self-pay | Admitting: Family Medicine

## 2022-12-06 ENCOUNTER — Other Ambulatory Visit: Payer: Self-pay | Admitting: Family Medicine

## 2022-12-09 ENCOUNTER — Other Ambulatory Visit: Payer: Self-pay | Admitting: Family Medicine

## 2022-12-09 NOTE — Telephone Encounter (Signed)
OK, trazodone rx'd. Needs f/u 1-2 mo

## 2022-12-09 NOTE — Telephone Encounter (Signed)
Last filled 06/13/22 #120 Last ov 07/03/22 Next ov  none

## 2022-12-10 NOTE — Telephone Encounter (Addendum)
LM for pt regarding medication.  If pt returns call, please schedule provider appt.

## 2022-12-11 NOTE — Telephone Encounter (Signed)
Pt advised refill sent. °

## 2022-12-19 ENCOUNTER — Other Ambulatory Visit: Payer: Self-pay | Admitting: Family Medicine

## 2022-12-24 DIAGNOSIS — Z1152 Encounter for screening for COVID-19: Secondary | ICD-10-CM | POA: Diagnosis not present

## 2022-12-24 DIAGNOSIS — J069 Acute upper respiratory infection, unspecified: Secondary | ICD-10-CM | POA: Diagnosis not present

## 2022-12-24 DIAGNOSIS — R6889 Other general symptoms and signs: Secondary | ICD-10-CM | POA: Diagnosis not present

## 2022-12-24 DIAGNOSIS — R0602 Shortness of breath: Secondary | ICD-10-CM | POA: Diagnosis not present

## 2022-12-26 ENCOUNTER — Encounter: Payer: Self-pay | Admitting: Registered Nurse

## 2022-12-26 ENCOUNTER — Encounter: Payer: Medicare Other | Attending: Physical Medicine & Rehabilitation | Admitting: Registered Nurse

## 2022-12-26 VITALS — BP 145/82 | HR 65 | Ht 64.0 in | Wt 136.4 lb

## 2022-12-26 DIAGNOSIS — M5412 Radiculopathy, cervical region: Secondary | ICD-10-CM | POA: Diagnosis not present

## 2022-12-26 DIAGNOSIS — G8929 Other chronic pain: Secondary | ICD-10-CM | POA: Diagnosis not present

## 2022-12-26 DIAGNOSIS — M1712 Unilateral primary osteoarthritis, left knee: Secondary | ICD-10-CM | POA: Insufficient documentation

## 2022-12-26 DIAGNOSIS — G894 Chronic pain syndrome: Secondary | ICD-10-CM | POA: Insufficient documentation

## 2022-12-26 DIAGNOSIS — M7062 Trochanteric bursitis, left hip: Secondary | ICD-10-CM | POA: Diagnosis not present

## 2022-12-26 DIAGNOSIS — Z5181 Encounter for therapeutic drug level monitoring: Secondary | ICD-10-CM | POA: Diagnosis not present

## 2022-12-26 DIAGNOSIS — M542 Cervicalgia: Secondary | ICD-10-CM | POA: Diagnosis not present

## 2022-12-26 DIAGNOSIS — Z79899 Other long term (current) drug therapy: Secondary | ICD-10-CM | POA: Diagnosis not present

## 2022-12-26 DIAGNOSIS — M546 Pain in thoracic spine: Secondary | ICD-10-CM | POA: Insufficient documentation

## 2022-12-26 DIAGNOSIS — M5416 Radiculopathy, lumbar region: Secondary | ICD-10-CM | POA: Insufficient documentation

## 2022-12-26 DIAGNOSIS — M1711 Unilateral primary osteoarthritis, right knee: Secondary | ICD-10-CM | POA: Diagnosis not present

## 2022-12-26 DIAGNOSIS — M7061 Trochanteric bursitis, right hip: Secondary | ICD-10-CM | POA: Diagnosis not present

## 2022-12-26 MED ORDER — OXYCODONE-ACETAMINOPHEN 10-325 MG PO TABS: 1.0000 | ORAL_TABLET | Freq: Four times a day (QID) | ORAL | 0 refills | Status: DC | PRN

## 2022-12-26 NOTE — Progress Notes (Signed)
Subjective:    Patient ID: Linda [name], female    DOB: January 07, 1946, 77 y.o.   MRN: [id number]  HPI: Linda [name] is a 77 y.o. female who returns for follow up appointment for chronic pain and medication refill. She states her pain is located in her neck radiating into her right shoulder, mid- lower back pain  and bilateral hip pain. She also reports bilateral knee pain. She rates her pain 6. Her current exercise regime is walking.  Ms. Doane Morphine equivalent is 58.00 MME. She  is also prescribed Alprazolam  by [name] .We have discussed the black box warning of using opioids and benzodiazepines. I highlighted the dangers of using these drugs together and discussed the adverse events including respiratory suppression, overdose, cognitive impairment and importance of compliance with current regimen. We will continue to monitor and adjust as indicated.   Oral Swab was Performed today.    Pain Inventory Average Pain 6 Pain Right Now 6 My pain is sharp  In the last 24 hours, has pain interfered with the following? General activity 6 Relation with others 9 Enjoyment of life 8 What TIME of day is your pain at its worst? morning , daytime, evening, and night Sleep (in general) Fair  Pain is worse with: walking, bending, and some activites Pain improves with: rest, medication, and injections Relief from Meds: 7  Family History  Problem Relation Age of Onset   Arthritis Mother    Kidney disease Mother    Heart disease Father    Stroke Father    Hypertension Father    Diabetes Father    Heart attack Father    Heart attack Paternal Grandmother    Diabetes Sister        one sister   Hypertension Sister    Asthma Sister    Hypertension Brother    Asthma Brother    Asthma Daughter    Multiple sclerosis Son    Social History   Socioeconomic History   Marital status: Widowed    Spouse name: Not on file   Number of children: 2   Years of education: Not on file    Highest education level: Not on file  Occupational History   Occupation: Retired    Comment: Pharmacist, 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  Relocated to [place] 09/2012 to be closer to her son who has MS.   Husband d 2015--mesothelioma.   Occupation: former Pharmacist, hospital.   Education: masters degree level.   No T/A/Ds.   Lives in [place], independent living.   Social Determinants of Health   Financial Resource Strain: Low Risk  (07/04/2021)   Overall Financial Resource Strain (CARDIA)    Difficulty of Paying Living Expenses: Not hard at all  Food Insecurity: No Food Insecurity (07/04/2021)   Hunger Vital Sign    Worried About Running Out of Food in the Last Year: Never true    Ran Out of Food in the Last Year: Never true  Transportation Needs: No Transportation Needs (07/04/2021)   PRAPARE -  Hydrologist (Medical): No    Lack of Transportation (Non-Medical): No  Physical Activity: Insufficiently Active (07/04/2021)   Exercise Vital Sign    Days of Exercise per Week: 2 days    Minutes of Exercise per Session: 20 min  Stress: No Stress Concern Present (07/04/2021)   Hope    Feeling of Stress : Not at all  Social Connections: Moderately Isolated (07/04/2021)   Social Connection and Isolation Panel [NHANES]    Frequency of Communication with Friends and Family: Three times a week    Frequency of Social Gatherings with Friends and Family: More than three times a week    Attends Religious Services: More than 4 times per year    Active Member of  Clubs or Organizations: No    Attends Archivist Meetings: Never    Marital Status: Widowed   Past Surgical History:  Procedure Laterality Date   APPENDECTOMY  1960   AXILLARY SURGERY Left 1978   Multiple "lump" in armpit per pt   BIOPSY  06/17/2020   Gastric bx->gastritis, h pylori neg.  Procedure: BIOPSY;  Surgeon: Carol Ada, MD;  Location: [facility];  Service: Endoscopy;;   CARDIAC CATHETERIZATION  01/2013   nonobstructive CAD, EF 55-60%   CARDIOVASCULAR STRESS TEST  02/22/2015   Low risk myocard perf imaging; wall motion normal, normal EF   carotid duplex doppler  10/21/2017   R vertebral flow suggestive of possible distal obstruction.  Pt declined further w/u as of 10/29/17 but need to revisit this problem periodically.   COCCYX REMOVAL  1972   DEXA  06/05/2017   T-score -3.1   DILATION AND CURETTAGE OF UTERUS  ? 1970's   ESOPHAGOGASTRODUODENOSCOPY N/A 07/19/2014   Gastritis found + in the setting of supratherapeutic INR, +plavix, + meloxicam.   ESOPHAGOGASTRODUODENOSCOPY (EGD) WITH PROPOFOL N/A 06/17/2020   NO stricture or other prob to explain pt's dysphagia, dilation was done anyway.  Gastric bx's-->gastritis, h pylori neg. Procedure: ESOPHAGOGASTRODUODENOSCOPY (EGD) WITH PROPOFOL;  Surgeon: Carol Ada, MD;  Location: [facility];  Service: Endoscopy;  Laterality: N/A;   EYE SURGERY Left 2012-2013   "injections for ~ 1 yr; don't really know what for" (07/12/2013)   HEEL SPUR SURGERY Left 2008   kidney stone removal Right    KNEE SURGERY  2005   LEFT HEART CATHETERIZATION WITH CORONARY ANGIOGRAM N/A 01/30/2013   Procedure: LEFT HEART CATHETERIZATION WITH CORONARY ANGIOGRAM;  Surgeon: Clent Demark, MD;  Location: [facility];  Service: Cardiovascular;  Laterality: N/A;   MALONEY DILATION  06/17/2020   Procedure: Venia Minks DILATION;  Surgeon: Carol Ada, MD;  Location: [facility];  Service: Endoscopy;;   PLANTAR FASCIA RELEASE Left 2008   SPIROMETRY   04/25/2014   In hosp for acute asthma/COPD flare: mixed obstructive and restrictive lung disease. The FEV1 is severely reduced at 45% predicted.  FEV1 signif decreased compared to prior spirometry 07/23/13.   TENDON RELEASE  1996   Right forearm and hand   TOTAL ABDOMINAL HYSTERECTOMY  1974   TRANSTHORACIC ECHOCARDIOGRAM  01/2013; 04/2014;08/2015; 09/2017   2014--NORMAL.  2015--focal basal septal hypertrophy, EF 55-60%, grade I diast dysfxn, mild LAE.  08/2015 EF 55-60%, nl LV syst fxn, grade I DD, valves wnl. 10/21/17: EF 65-70%, grd I DD, o/w normal. 02/17/22 EF 70-75%, hyperdynamic LV fxn, grd I DD.   Past Surgical History:  Procedure Laterality Date   APPENDECTOMY  1960  AXILLARY SURGERY Left 1978   Multiple "lump" in armpit per pt   BIOPSY  06/17/2020   Gastric bx->gastritis, h pylori neg.  Procedure: BIOPSY;  Surgeon: Carol Ada, MD;  [facility]: [facility];  Service: Endoscopy;;   CARDIAC CATHETERIZATION  01/2013   nonobstructive CAD, EF 55-60%   CARDIOVASCULAR STRESS TEST  02/22/2015   Low risk myocard perf imaging; wall motion normal, normal EF   carotid duplex doppler  10/21/2017   R vertebral flow suggestive of possible distal obstruction.  Pt declined further w/u as of 10/29/17 but need to revisit this problem periodically.   COCCYX REMOVAL  1972   DEXA  06/05/2017   T-score -3.1   DILATION AND CURETTAGE OF UTERUS  ? 1970's   ESOPHAGOGASTRODUODENOSCOPY N/A 07/19/2014   Gastritis found + in the setting of supratherapeutic INR, +plavix, + meloxicam.   ESOPHAGOGASTRODUODENOSCOPY (EGD) WITH PROPOFOL N/A 06/17/2020   NO stricture or other prob to explain pt's dysphagia, dilation was done anyway.  Gastric bx's-->gastritis, h pylori neg. Procedure: ESOPHAGOGASTRODUODENOSCOPY (EGD) WITH PROPOFOL;  Surgeon: Carol Ada, MD;  Location: [facility];  Service: Endoscopy;  Laterality: N/A;   EYE SURGERY Left 2012-2013   "injections for ~ 1 yr; don't really know what for" (07/12/2013)    HEEL SPUR SURGERY Left 2008   kidney stone removal Right    KNEE SURGERY  2005   LEFT HEART CATHETERIZATION WITH CORONARY ANGIOGRAM N/A 01/30/2013   Procedure: LEFT HEART CATHETERIZATION WITH CORONARY ANGIOGRAM;  Surgeon: Clent Demark, MD;  Location: [facility];  Service: Cardiovascular;  Laterality: N/A;   MALONEY DILATION  06/17/2020   Procedure: Venia Minks DILATION;  Surgeon: Carol Ada, MD;  Location: [facility];  Service: Endoscopy;;   PLANTAR FASCIA RELEASE Left 2008   SPIROMETRY  04/25/2014   In hosp for acute asthma/COPD flare: mixed obstructive and restrictive lung disease. The FEV1 is severely reduced at 45% predicted.  FEV1 signif decreased compared to prior spirometry 07/23/13.   TENDON RELEASE  1996   Right forearm and hand   TOTAL ABDOMINAL HYSTERECTOMY  1974   TRANSTHORACIC ECHOCARDIOGRAM  01/2013; 04/2014;08/2015; 09/2017   2014--NORMAL.  2015--focal basal septal hypertrophy, EF 55-60%, grade I diast dysfxn, mild LAE.  08/2015 EF 55-60%, nl LV syst fxn, grade I DD, valves wnl. 10/21/17: EF 65-70%, grd I DD, o/w normal. 02/17/22 EF 70-75%, hyperdynamic LV fxn, grd I DD.   Past Medical History:  Diagnosis Date   Acute upper GI bleed 06/2014   while pt taking coumadin, plavix, and meloxicam---despite being told not to take coumadin.   Anginal pain ([place])    Nonobstructive CAD 2014; however, her cardiologist put her on a statin for this and NOT for hyperlipidemia per pt report.  Atyp CP 08/2017 at card f/u, plan for myoc perf imaging.   Anxiety    panic attacks   Asthma    w/ asbestososis    BPPV (benign paroxysmal positional vertigo) 12/16/2012   Chronic diastolic CHF (congestive heart failure) (HCC)    dry wt as of 11/06/16 is 168 lbs.   Chronic lower back pain    COPD (chronic obstructive pulmonary disease) (HCC)    DDD (degenerative disc disease)    lumbar and cervical.    Diverticular disease    Fibromyalgia    Patient states dx was around her late 60s but she had  sx's for years prior to this.   H/O hiatal hernia    History of pneumonia    hospitalized 12/2011, 02/2013,  and 07/2013 [facility]) for this   HTN (hypertension)    Renal artery dopplers 04/2013 neg for stenosis.   Hypervitaminosis D 09/27/2019   over-supplemented.  Stopped vit D and plan recheck 2 mo.   Idiopathic angio-edema-urticaria 72014; 2021   Angioedema component was very minimal.  2021->Dr. Bobbitt (allergist) eval.   Insomnia    Iron deficiency anemia    [place]emat[place] in [place], [place] did extensive w/u; no cause found; failed oral supplement;; gets fairly regular (q9mor so) IV iron infusions (Venofer -iron sucrose- '200mg'$  with procrit.  "for 14 yr I've been getting blood work q month & getting infusions prn" (07/12/2013).  Dr. EMarin Olplocally, iron infusions done, EPO deficiency dx'd   Kidney stone    Migraine syndrome    "not as often anymore; used to be ~ q wk" (07/12/2013)   Mixed incontinence urge and stress    Nephrolithiasis    "passed all on my own or they are still in there" (07/12/2013)   Neuroleptic induced parkinsonism (HCC) 2018   Dr. TCarles Collet neuro, saw her 11/24/17 and recommended d/c of abilify as first step.  D/c'd abilify and pt got complete recovery.   Oropharyngeal dysphagia    swallowing study speech path 05/2020. Gastric bx's showed gastritis, h pylori NEG   OSA on CPAP    prior to move to [place]--had another sleep study 10/2015 w/pulm Dr. ECamillo Flaming   Osteoarthritis    "severe; progressing fast" (07/12/2013); multiple joints-not surgical candidate for TKR (03/2015).  Triamcinolon knee injections by Dr. STessa Lerner1/2019.   Pernicious anemia 08/24/2014   Pleural plaque with presence of asbestos 07/22/2013   [place]   [facility]: pt took coumadin for about 8-9 mo   Pyelonephritis    "several times over the last 30 yr" (07/12/2013)   RBBB (right bundle branch block)    Recurrent major depression ([place]    Recurrent  UTI    hx of hospitalization for pyelonephritis; started abx prophylaxis 06/2015   Syncope    Hypotensive; ED visit--Dr. HTerrence Dupontdid Cath--nonobstructive CAD, EF 55-60%.  In retrospect, suspect pt rx med misuse/polypharmacy   Vertebral compression fracture ([place]    Acute T12 on 02/27/22 (fall).  multiple old thoracic-->neurosurg to do MRI as of 05/2022   BP (!) 177/94   Pulse 65   Ht '5\' 4"'$  (1.626 m)   Wt 136 lb 6.4 oz (61.9 kg)   SpO2 91%   BMI 23.41 kg/m   Opioid Risk Score:   Fall Risk Score:  `1  Depression screen PHQ 2/9     10/09/2022   12:53 PM 08/07/2022    1:49 PM 06/19/2022    1:10 PM 04/01/2022   12:00 PM 03/07/2022    2:49 PM 02/08/2022    1:58 PM 01/11/2022    1:05 PM  Depression screen PHQ 2/9  Decreased Interest 0 0 3 2 0 1 0  Down, Depressed, Hopeless 0 0 2 0 0 1 0  PHQ - 2 Score 0 0 5 2 0 2 0  Altered sleeping   0    2  Tired, decreased energy   3    3  Change in appetite   2    0  Feeling bad or failure about yourself    0    0  Trouble concentrating   2    0  Moving slowly or fidgety/restless       0  Suicidal thoughts   1    0  PHQ-9 Score   13    5  Difficult doing work/chores   Somewhat difficult    Not difficult at all      Review of Systems  Musculoskeletal:  Positive for back pain.       Bilateral knee pain  All other systems reviewed and are negative.      Objective:   Physical Exam Vitals and nursing note reviewed.  Constitutional:      Appearance: Normal appearance.  Neck:     Comments: Cervical Paraspinal Tenderness: C-5-C-6 Cardiovascular:     Rate and Rhythm: Normal rate and regular rhythm.     Pulses: Normal pulses.     Heart sounds: Normal heart sounds.  Pulmonary:     Effort: Pulmonary effort is normal.     Breath sounds: Normal breath sounds.  Musculoskeletal:     Cervical back: Normal range of motion and neck supple.     Comments: Normal Muscle Bulk and Muscle Testing Reveals:  Upper Extremities:Full  ROM and Muscle  Strength 5/5 Right AC Joint tenderness  Thoracic Paraspinal Tenderness: T-7-T-9 Lumbar Paraspinal Tenderness: L-4-L-5 Lower Extremities: Full ROM and Muscle Strength 5/5 Arises from Table slowly Narrow Based  Gait     Skin:    General: Skin is warm and dry.  Neurological:     Mental Status: She is alert and oriented to person, place, and time.  Psychiatric:        Mood and Affect: Mood normal.        Behavior: Behavior normal.         Assessment & Plan:  Cervicalgia/ Cervical Radiculitis: Cervical X-ray.was reviewed. This will be discussed with [name], she has a scheduled appointment with [name], he will reviewed . Ms. Schwenke verbalizes understanding. Continue to Monitor. 12/26/2022 Left Pelvic Pain/ Left Hip Pain: Continue HEP as Tolerated. Continue to Monitor. 12/26/2022 3. Functional deficits secondary to Gait disorder:Continue with HEP. 12/26/2022. 4. Chronic Back pain/ Lumbar Radiculitis/fibromyalgia /R>L Knee OA Pain Management: 12/26/2022 Refilled: Oxycodone 10/325 mg one tablet every 6 hours #120.  Second script sent for the following month.  Continue Voltaren Gel. We will continue the opioid monitoring program, this consists of regular clinic visits, examinations, urine drug screen, pill counts as well as use of [place] Controlled Substance Reporting system. A 12 month History has been reviewed on the [place] Controlled Substance Reporting System on 12/26/2022 5. Depression with anxiety/Grief reaction/Mood: Continue Xanax, Trazodone and Cymbalta . PCP Following. 12/26/2022 6. [name] with asthma:. Albuterol prn. :Pulmonology Following. 12/26/2022 7.. Bilateral Osteoarthritis of Bilateral Knees: Ms. Amenta scheduled with [name] for Bilateral Zilretta Injection with [name], she verbalizes understanding.Continue to Monitor. 12/26/2022 8. Fibromyalgia: Continue Home exercise regimen as tolerated. Continue to Monitor.12/26/2022 9. Bilateral  Greater  Trochanteric Bursitis: Continue to  Alternate Ice and Heat Therapy. Continue to Monitor. 12/26/2022. 10. Chronic Bilateral Shoulder Pain:  No complaints  Today. Continue HEP as Tolerated. Continue current medication regimen. Continue to Monitor. 12/26/2022  F/U in 2 months

## 2022-12-27 ENCOUNTER — Ambulatory Visit: Payer: Medicare Other | Admitting: Registered Nurse

## 2023-01-01 ENCOUNTER — Encounter: Payer: Self-pay | Admitting: Family Medicine

## 2023-01-01 ENCOUNTER — Ambulatory Visit (INDEPENDENT_AMBULATORY_CARE_PROVIDER_SITE_OTHER): Payer: Medicare Other

## 2023-01-01 ENCOUNTER — Ambulatory Visit (INDEPENDENT_AMBULATORY_CARE_PROVIDER_SITE_OTHER): Payer: Medicare Other | Admitting: Family Medicine

## 2023-01-01 VITALS — BP 80/45 | HR 76 | Temp 98.3°F | Ht 64.0 in | Wt 135.2 lb

## 2023-01-01 DIAGNOSIS — I959 Hypotension, unspecified: Secondary | ICD-10-CM

## 2023-01-01 DIAGNOSIS — I10 Essential (primary) hypertension: Secondary | ICD-10-CM | POA: Diagnosis not present

## 2023-01-01 DIAGNOSIS — Z79899 Other long term (current) drug therapy: Secondary | ICD-10-CM

## 2023-01-01 DIAGNOSIS — F5101 Primary insomnia: Secondary | ICD-10-CM

## 2023-01-01 DIAGNOSIS — E7800 Pure hypercholesterolemia, unspecified: Secondary | ICD-10-CM

## 2023-01-01 DIAGNOSIS — I2510 Atherosclerotic heart disease of native coronary artery without angina pectoris: Secondary | ICD-10-CM | POA: Diagnosis not present

## 2023-01-01 DIAGNOSIS — F411 Generalized anxiety disorder: Secondary | ICD-10-CM | POA: Diagnosis not present

## 2023-01-01 DIAGNOSIS — F329 Major depressive disorder, single episode, unspecified: Secondary | ICD-10-CM

## 2023-01-01 DIAGNOSIS — F339 Major depressive disorder, recurrent, unspecified: Secondary | ICD-10-CM | POA: Diagnosis not present

## 2023-01-01 DIAGNOSIS — Z0000 Encounter for general adult medical examination without abnormal findings: Secondary | ICD-10-CM

## 2023-01-01 DIAGNOSIS — N390 Urinary tract infection, site not specified: Secondary | ICD-10-CM | POA: Diagnosis not present

## 2023-01-01 DIAGNOSIS — Z23 Encounter for immunization: Secondary | ICD-10-CM

## 2023-01-01 LAB — DRUG TOX MONITOR 1 W/CONF, ORAL FLD
Alprazolam: 0.59 ng/mL — ABNORMAL HIGH (ref <0.50)
Amphetamines: NEGATIVE ng/mL (ref <10)
Barbiturates: NEGATIVE ng/mL (ref <10)
Benzodiazepines: POSITIVE ng/mL — AB (ref <0.50)
Buprenorphine: NEGATIVE ng/mL (ref <0.10)
Chlordiazepoxide: NEGATIVE ng/mL (ref <0.50)
Clonazepam: NEGATIVE ng/mL (ref <0.50)
Cocaine: NEGATIVE ng/mL (ref <5.0)
Codeine: NEGATIVE ng/mL (ref <2.5)
Diazepam: NEGATIVE ng/mL (ref <0.50)
Dihydrocodeine: NEGATIVE ng/mL (ref <2.5)
Fentanyl: NEGATIVE ng/mL (ref <0.10)
Flunitrazepam: NEGATIVE ng/mL (ref <0.50)
Flurazepam: NEGATIVE ng/mL (ref <0.50)
Heroin Metabolite: NEGATIVE ng/mL (ref <1.0)
Hydrocodone: NEGATIVE ng/mL (ref <2.5)
Hydromorphone: NEGATIVE ng/mL (ref <2.5)
Lorazepam: NEGATIVE ng/mL (ref <0.50)
MARIJUANA: NEGATIVE ng/mL (ref <2.5)
MDMA: NEGATIVE ng/mL (ref <10)
Meprobamate: NEGATIVE ng/mL (ref <2.5)
Methadone: NEGATIVE ng/mL (ref <5.0)
Midazolam: NEGATIVE ng/mL (ref <0.50)
Morphine: NEGATIVE ng/mL (ref <2.5)
Nicotine Metabolite: NEGATIVE ng/mL (ref <5.0)
Nordiazepam: NEGATIVE ng/mL (ref <0.50)
Norhydrocodone: NEGATIVE ng/mL (ref <2.5)
Noroxycodone: 22.1 ng/mL — ABNORMAL HIGH (ref <2.5)
Opiates: POSITIVE ng/mL — AB (ref <2.5)
Oxazepam: NEGATIVE ng/mL (ref <0.50)
Oxycodone: 54.5 ng/mL — ABNORMAL HIGH (ref <2.5)
Oxymorphone: NEGATIVE ng/mL (ref <2.5)
Phencyclidine: NEGATIVE ng/mL (ref <10)
Tapentadol: NEGATIVE ng/mL (ref <5.0)
Temazepam: NEGATIVE ng/mL (ref <0.50)
Tramadol: NEGATIVE ng/mL (ref <5.0)
Triazolam: NEGATIVE ng/mL (ref <0.50)
Zolpidem: NEGATIVE ng/mL (ref <5.0)

## 2023-01-01 LAB — POCT URINALYSIS DIPSTICK
Blood, UA: NEGATIVE
Glucose, UA: NEGATIVE
Nitrite, UA: NEGATIVE
Protein, UA: POSITIVE — AB
Spec Grav, UA: 1.025 (ref 1.010–1.025)
Urobilinogen, UA: 1 E.U./dL
pH, UA: 5 (ref 5.0–8.0)

## 2023-01-01 LAB — DRUG TOX ALC METAB W/CON, ORAL FLD: Alcohol Metabolite: NEGATIVE ng/mL (ref <25)

## 2023-01-01 MED ORDER — DULOXETINE HCL 60 MG PO CPEP: 60.0000 mg | ORAL_CAPSULE | Freq: Every day | ORAL | 1 refills | Status: DC

## 2023-01-01 MED ORDER — ROSUVASTATIN CALCIUM 20 MG PO TABS: 20.0000 mg | ORAL_TABLET | Freq: Every evening | ORAL | 1 refills | Status: DC

## 2023-01-01 MED ORDER — CEFDINIR 300 MG PO CAPS: 300.0000 mg | ORAL_CAPSULE | Freq: Two times a day (BID) | ORAL | 0 refills | Status: DC

## 2023-01-01 MED ORDER — LAMOTRIGINE 200 MG PO TABS: 200.0000 mg | ORAL_TABLET | Freq: Every day | ORAL | 1 refills | Status: DC

## 2023-01-01 MED ORDER — METOPROLOL SUCCINATE ER 100 MG PO TB24: ORAL_TABLET | ORAL | 1 refills | Status: DC

## 2023-01-01 MED ORDER — PREDNISONE 10 MG PO TABS: ORAL_TABLET | ORAL | 0 refills | Status: DC

## 2023-01-01 MED ORDER — IPRATROPIUM BROMIDE 0.03 % NA SOLN: 2.0000 | Freq: Two times a day (BID) | NASAL | 5 refills | Status: DC | PRN

## 2023-01-01 MED ORDER — ALBUTEROL SULFATE HFA 108 (90 BASE) MCG/ACT IN AERS: 1.0000 | INHALATION_SPRAY | Freq: Four times a day (QID) | RESPIRATORY_TRACT | 5 refills | Status: DC | PRN

## 2023-01-01 MED ORDER — DONEPEZIL HCL 10 MG PO TABS: ORAL_TABLET | ORAL | 1 refills | Status: DC

## 2023-01-01 MED ORDER — PANTOPRAZOLE SODIUM 40 MG PO TBEC: 40.0000 mg | DELAYED_RELEASE_TABLET | Freq: Two times a day (BID) | ORAL | 1 refills | Status: DC

## 2023-01-01 NOTE — Progress Notes (Signed)
OFFICE VISIT  01/08/2023  CC:  Chief Complaint  Patient presents with   Medical Management of Chronic Issues    Patient is a 77 y.o. female who presents for 32-monthfollow-up recurrent major depressive disorder, generalized anxiety, hypertension, insomnia, and restless leg syndrome. I last saw her for a virtual visit on 07/03/2022. A/P as of that visit: "Recurrent major depressive disorder resistant to treatment. Generalized anxiety disorder. Increase Lamictal to 200 mg a day. Continue alprazolam 0.5 3 times daily as needed. Continue Cymbalta 60 mg a day. Continue trazodone 50 mg, 2-4 qhs insomnia."  INTERIM HX: Says she does not feel well.  Headache.  No dizziness.  Yesterday her bp was 195/126 yest (was dizzy/HA), took extra '100mg'$  toprol. Was up the 4d prior similar.  Today in the office her blood pressure is low.  UC visit for cough/cong about 1 wk ago, rx'd prednisone low dose ("5 tabs"), no better. Flu and covid tests neg.  For about the last 2 months she has had urinary urgency and frequency, says she feels like she has a UTI. No fever, gross hematuria, abdominal pain, nausea, or flank pain.  Overall, mood and anxiety levels have been stable.  PMP AWARE reviewed today: most recent rx for alprazolam was filled 12/26/2022, # [id number] rx by me.  Her opioid pain meds are consistently prescribed by her pain management physician. No red flags.  ROS as above, plus-->no CP,  no cough, no rashes, no melena/hematochezia.  No polyuria or polydipsia.  No myalgias or arthralgias.  No focal weakness, paresthesias, or tremors.  No acute vision or hearing abnormalities.  No recent changes in lower legs. No n/v/d or abd pain.  No palpitations.    Past Medical History:  Diagnosis Date   Acute upper GI bleed 06/2014   while pt taking coumadin, plavix, and meloxicam---despite being told not to take coumadin.   [place]    Nonobstructive CAD 2014; however, her cardiologist put her on a  statin for this and NOT for hyperlipidemia per pt report.  Atyp CP 08/2017 at card f/u, plan for myoc perf imaging.   Anxiety    panic attacks   Asthma    w/ asbestososis    BPPV (benign paroxysmal positional vertigo) 12/16/2012   Chronic diastolic CHF (congestive heart failure) (HCC)    dry wt as of 11/06/16 is 168 lbs.   Chronic lower back pain    COPD (chronic obstructive pulmonary disease) (HCC)    DDD (degenerative disc disease)    lumbar and cervical.    Diverticular disease    Fibromyalgia    Patient states dx was around her late 89sbut she had sx's for years prior to this.   H/O hiatal hernia    History of pneumonia    hospitalized 12/2011, 02/2013, and 07/2013 ([facility] for this   HTN (hypertension)    Renal artery dopplers 04/2013 neg for stenosis.   Hypervitaminosis D 09/27/2019   over-supplemented.  Stopped vit D and plan recheck 2 mo.   Idiopathic angio-edema-urticaria 72014; 2021   Angioedema component was very minimal.  2021->Dr. Bobbitt (allergist) eval.   Insomnia    Iron deficiency anemia    [place]; no cause found; failed oral supplement;; gets fairly regular (q239mr so) IV iron infusions (Venofer -iron sucrose- '200mg'$  with procrit.  "for 14 yr I've been getting blood work q month & getting infusions prn" (07/12/2013).  Dr. EnMarin Olpocally, iron infusions done, EPO  deficiency dx'd   Kidney stone    Migraine syndrome    "not as often anymore; used to be ~ q wk" (07/12/2013)   Mixed incontinence urge and stress    Nephrolithiasis    "passed all on my own or they are still in there" (07/12/2013)   Neuroleptic induced parkinsonism (HCC) 2018   Dr. Carles Collet, neuro, saw her 11/24/17 and recommended d/c of abilify as first step.  D/c'd abilify and pt got complete recovery.   Oropharyngeal dysphagia    swallowing study speech path 05/2020. Gastric bx's showed gastritis, h pylori NEG   OSA on CPAP    prior to move to [place]--had another sleep study  10/2015 w/pulm Dr. Camillo Flaming.   Osteoarthritis    "severe; progressing fast" (07/12/2013); multiple joints-not surgical candidate for TKR (03/2015).  Triamcinolon knee injections by Dr. Tessa Lerner 12/2017.   Pernicious anemia 08/24/2014   Pleural plaque with presence of asbestos 07/22/2013   Pulmonary embolism ([place]) 07/2013   Dx at [facility] with very small peripheral upper lobe pe 07/2013: pt took coumadin for about 8-9 mo   Pyelonephritis    "several times over the last 30 yr" (07/12/2013)   RBBB (right bundle branch block)    Recurrent major depression ([place])    Recurrent UTI    hx of hospitalization for pyelonephritis; started abx prophylaxis 06/2015   Syncope    Hypotensive; ED visit--Dr. Terrence Dupont did Cath--nonobstructive CAD, EF 55-60%.  In retrospect, suspect pt rx med misuse/polypharmacy   Vertebral compression fracture ([place])    Acute T12 on 02/27/22 (fall).  multiple old thoracic-->neurosurg to do MRI as of 05/2022    Past Surgical History:  Procedure Laterality Date   APPENDECTOMY  1960   AXILLARY SURGERY Left 1978   Multiple "lump" in armpit per pt   BIOPSY  06/17/2020   Gastric bx->gastritis, h pylori neg.  Procedure: BIOPSY;  Surgeon: Carol Ada, MD;  Location: [facility];  Service: Endoscopy;;   CARDIAC CATHETERIZATION  01/2013   nonobstructive CAD, EF 55-60%   CARDIOVASCULAR STRESS TEST  02/22/2015   Low risk myocard perf imaging; wall motion normal, normal EF   carotid duplex doppler  10/21/2017   R vertebral flow suggestive of possible distal obstruction.  Pt declined further w/u as of 10/29/17 but need to revisit this problem periodically.   COCCYX REMOVAL  1972   DEXA  06/05/2017   T-score -3.1   DILATION AND CURETTAGE OF UTERUS  ? 1970's   ESOPHAGOGASTRODUODENOSCOPY N/A 07/19/2014   Gastritis found + in the setting of supratherapeutic INR, +plavix, + meloxicam.   ESOPHAGOGASTRODUODENOSCOPY (EGD) WITH PROPOFOL N/A 06/17/2020   NO stricture or other prob to explain pt's  dysphagia, dilation was done anyway.  Gastric bx's-->gastritis, h pylori neg. Procedure: ESOPHAGOGASTRODUODENOSCOPY (EGD) WITH PROPOFOL;  Surgeon: Carol Ada, MD;  Location: [facility];  Service: Endoscopy;  Laterality: N/A;   EYE SURGERY Left 2012-2013   "injections for ~ 1 yr; don't really know what for" (07/12/2013)   HEEL SPUR SURGERY Left 2008   kidney stone removal Right    KNEE SURGERY  2005   LEFT HEART CATHETERIZATION WITH CORONARY ANGIOGRAM N/A 01/30/2013   Procedure: LEFT HEART CATHETERIZATION WITH CORONARY ANGIOGRAM;  Surgeon: Clent Demark, MD;  Location: [place] CATH [facility];  Service: Cardiovascular;  Laterality: N/A;   MALONEY DILATION  06/17/2020   Procedure: Venia Minks DILATION;  Surgeon: Carol Ada, MD;  Location: [facility];  Service: Endoscopy;;   PLANTAR FASCIA RELEASE Left 2008   SPIROMETRY  04/25/2014   In hosp for acute asthma/COPD flare: mixed obstructive and restrictive lung disease. The FEV1 is severely reduced at 45% predicted.  FEV1 signif decreased compared to prior spirometry 07/23/13.   TENDON RELEASE  1996   Right forearm and hand   TOTAL ABDOMINAL HYSTERECTOMY  1974   TRANSTHORACIC ECHOCARDIOGRAM  01/2013; 04/2014;08/2015; 09/2017   2014--NORMAL.  2015--focal basal septal hypertrophy, EF 55-60%, grade I diast dysfxn, mild LAE.  08/2015 EF 55-60%, nl LV syst fxn, grade I DD, valves wnl. 10/21/17: EF 65-70%, grd I DD, o/w normal. 02/17/22 EF 70-75%, hyperdynamic LV fxn, grd I DD.    Outpatient Medications Prior to Visit  Medication Sig Dispense Refill   ALPRAZolam (XANAX) 0.5 MG tablet TAKE ONE TABLET BY MOUTH THREE TIMES DAILY 90 tablet 5   aspirin 81 MG tablet Take 81 mg by mouth at bedtime.     cyclobenzaprine (FLEXERIL) 5 MG tablet Take 5 mg by mouth 2 (two) times daily as needed.     diclofenac Sodium (VOLTAREN) 1 % GEL Apply 2 g topically 4 (four) times daily. 4 times a day to both knees 1000 each 4   EPINEPHrine 0.3 mg/0.3 mL IJ SOAJ injection Inject 0.3 mg  into the muscle as needed for anaphylaxis. 1 each 1   isosorbide mononitrate (IMDUR) 30 MG 24 hr tablet TAKE 2 TABLETS IN THE AM AND 1 TABLET IN THE PM. 270 tablet 0   methocarbamol (ROBAXIN) 500 MG tablet Take 1 tablet (500 mg total) by mouth every 6 (six) hours as needed for muscle spasms. 80 tablet 2   Misc Natural Products (NEURIVA PO) Take by mouth.     Multiple Vitamin (MULTIVITAMIN WITH MINERALS) TABS tablet Take 1 tablet by mouth daily.     oxyCODONE-acetaminophen (PERCOCET) 10-325 MG tablet Take 1 tablet by mouth every 6 (six) hours as needed for pain. for pain 120 tablet 0   polyvinyl alcohol (LIQUIFILM TEARS) 1.4 % ophthalmic solution Place 1 drop into both eyes as needed for dry eyes.     solifenacin (VESICARE) 10 MG tablet TAKE 1 TABLET ONCE A DAY. 90 tablet 0   traZODone (DESYREL) 50 MG tablet take 2-4 tablets at bedtime as needed for insomnia. 120 tablet 5   albuterol (VENTOLIN HFA) 108 (90 Base) MCG/ACT inhaler Inhale 1-2 puffs into the lungs every 6 (six) hours as needed for wheezing or shortness of breath. 18 g 1   furosemide (LASIX) 40 MG tablet Take 1 tablet (40 mg total) by mouth daily as needed for fluid. 30 tablet 6   ipratropium (ATROVENT) 0.03 % nasal spray Place 2 sprays into both nostrils every 12 (twelve) hours as needed (allergies).     nitroGLYCERIN (NITROSTAT) 0.4 MG SL tablet Place 1 tablet (0.4 mg total) under the tongue every 5 (five) minutes as needed for chest pain (x 3 doses). 25 tablet 11   donepezil (ARICEPT) 10 MG tablet Take 1 tablet (10 mg total) by mouth at bedtime. 90 tablet 1   DULoxetine (CYMBALTA) 60 MG capsule Take 1 capsule (60 mg total) by mouth daily. 1 tab p.o. daily 90 capsule 1   lamoTRIgine (LAMICTAL) 200 MG tablet Take 1 tablet (200 mg total) by mouth daily. 30 tablet 0   metoprolol succinate (TOPROL-XL) 100 MG 24 hr tablet take ONE tab daily, take additional 1/2 tab as needed FOR FOR BLOOD PRESSURE > 160/90 135 tablet 1   pantoprazole  (PROTONIX) 40 MG tablet Take 1 tablet (40 mg total) by mouth  2 (two) times daily. 180 tablet 1   rosuvastatin (CRESTOR) 20 MG tablet Take 1 tablet (20 mg total) by mouth every evening. 90 tablet 1   No facility-administered medications prior to visit.    Allergies  Allergen Reactions   Aripiprazole Other (See Comments) and Rash    parkinsonism Other reaction(s): Other parkinsonism  parkinsonism parkinsonism   Penicillins Itching, Swelling, Rash and Shortness Of Breath    Tolerated Cefepime in ED. Has patient had a PCN reaction causing immediate rash, facial/tongue/throat swelling, SOB or lightheadedness with hypotension: Yes Has patient had a PCN reaction causing severe rash involving mucus membranes or skin necrosis: No Has patient had a PCN reaction that required hospitalization: No  Has patient had a PCN reaction occurring within the last 10 years: No  Tolerated Cefepime in ED. Has patient had a PCN reaction causing immediate rash, facial/tongue/throat swelling, SOB or lightheadedness with hypotension: Yes Has patient had a PCN reaction causing severe rash involving mucus membranes or skin necrosis: No Has patient had a PCN reaction that required hospitalization: No  Has patient had a PCN reaction occurring within the last 10 years: No Rash,swelling Tolerated Cefepime in ED.  Rash,swelling  Tolerated Cefepime in ED.  Tolerated Cefepime in ED. Has patient had a PCN reaction causing immediate rash, facial/tongue/throat swelling, SOB or lightheadedness with hypotension: Yes Has patient had a PCN reaction causing severe rash involving mucus membranes or skin necrosis: No Has patient had a PCN reaction that required hospitalization: No  Has patient had a PCN reaction occurring within the last 10 years: No   Bactrim [Sulfamethoxazole-Trimethoprim] Rash    Review of Systems As per HPI  PE:    01/01/2023    2:07 PM 01/01/2023    1:00 PM 12/26/2022   11:12 AM  Vitals with BMI  Height  '5\' 4"'$  '5\' 4"'$    Weight 135 lbs 3 oz 135 lbs 3 oz   BMI 76.2 83.1   Systolic 80 80 517  Diastolic 45 45 82  Pulse 76 76 65  02 sat 93% RA today  Physical Exam  VS: noted--normal. Gen: alert, NAD, NONTOXIC APPEARING. HEENT: eyes without injection, drainage, or swelling.  Ears: EACs clear, TMs with normal light reflex and landmarks.  Nose: Clear rhinorrhea, with some dried, crusty exudate adherent to mildly injected mucosa.  No purulent d/c.  No paranasal sinus TTP.  No facial swelling.  Throat and mouth without focal lesion.  No pharyngial swelling, erythema, or exudate.   Neck: supple, no LAD.   LUNGS: CTA bilat, nonlabored resps.   CV: RRR, no m/r/g. EXT: no c/c/e SKIN: no rash   LABS:  Last CBC Lab Results  Component Value Date   WBC 9.3 01/01/2023   HGB 13.4 01/01/2023   HCT 40.0 01/01/2023   MCV 87.3 01/01/2023   MCH 29.2 03/26/2022   RDW 14.9 01/01/2023   PLT 214.0 01/01/2023   Lab Results  Component Value Date   IRON 116 06/05/2015   TIBC 284 06/05/2015   FERRITIN 1,612 (H) 61/60/7371   Last metabolic panel Lab Results  Component Value Date   GLUCOSE 88 01/01/2023   NA 139 01/01/2023   K 3.8 01/01/2023   CL 99 01/01/2023   CO2 31 01/01/2023   BUN 22 01/01/2023   CREATININE 1.08 01/01/2023   GFRNONAA >60 03/26/2022   CALCIUM 8.6 01/01/2023   PHOS 4.0 02/27/2022   PROT 7.3 03/26/2022   ALBUMIN 4.0 03/26/2022   BILITOT 0.3 03/26/2022  ALKPHOS 65 03/26/2022   AST 18 03/26/2022   ALT 9 03/26/2022   ANIONGAP 8 03/26/2022   Last lipids Lab Results  Component Value Date   CHOL 113 01/01/2023   HDL 42.20 01/01/2023   LDLCALC 51 01/01/2023   TRIG 102.0 01/01/2023   CHOLHDL 3 01/01/2023   Last hemoglobin A1c Lab Results  Component Value Date   HGBA1C 5.3 02/27/2022   Last thyroid functions Lab Results  Component Value Date   TSH 1.488 02/27/2022   Last vitamin D Lab Results  Component Value Date   VD25OH 87.03 10/22/2019   Last vitamin B12  and Folate Lab Results  Component Value Date   EUMPNTIR44 315 (H) 02/27/2022   FOLATE 4.8 12/14/2012   IMPRESSION AND PLAN:  #1 labile hypertension. Blood pressure low here in the office today.  She will restart her Toprol 100 mg only when blood pressure rises over 130/80.  Keep plan of taking an additional half tab for blood pressure greater than 160/90. Electrolytes and creatinine today.  2.  URI with bronchitis. Prednisone 20 mg a day x 5 days and then 10 mg a day x 5 days.  Refilled albuterol inhaler. Cefdinir 300 mg twice daily x 7 days.  #3 urinary tract infection, acute.  She has a history of recurrent UTI. UA today with 1+ leukocytes. Sent urine specimen for culture and sensitivities.  #4 recurrent major depressive disorder resistant to treatment.  GAD.  Insomnia. All stable on Lamictal 200 mg a day, duloxetine 60 mg a day, alprazolam 0.5 mg 3 times daily, and trazodone 50 mg, 2 to 4 tablets nightly as needed.  #5 hypercholesterolemia, doing well on rosuvastatin 20 mg a day Goal LDL less than 70. Lipid panel today.  Of note, patient reiterated that she does has not been taking her Lasix at all in the last couple of months.  An After Visit Summary was printed and given to the patient.  FOLLOW UP: Return for 5-6d f/u bp and resp.  Signed:  Crissie Sickles, MD           01/08/2023

## 2023-01-01 NOTE — Patient Instructions (Signed)

## 2023-01-01 NOTE — Progress Notes (Signed)
Subjective:   Linda [name] is a 77 y.o. female who presents for Medicare Annual (Subsequent) preventive examination.  Review of Systems    Defer to PCP Cardiac Risk Factors include: advanced age (>96mn, >>46women);hypertension;dyslipidemia     Objective:    Today's Vitals   01/01/23 1407  BP: (!) 80/45  Pulse: 76  Temp: 98.3 F (36.8 C)  SpO2: 93%  Weight: 135 lb 3.2 oz (61.3 kg)  Height: '5\' 4"'$  (1.626 m)   Body mass index is 23.21 kg/m.     01/01/2023    1:54 PM 03/26/2022    9:45 AM 02/27/2022    3:04 AM 02/16/2022    1:57 PM 02/15/2022   10:17 PM 07/14/2021    7:36 PM 07/04/2021    1:36 PM  Advanced Directives  Does Patient Have a Medical Advance Directive? Yes Yes No No Yes Yes Yes  Type of Advance Directive Living will;Healthcare Power of [place] will  Does patient want to make changes to medical advance directive? No - Patient declined No - Patient declined       Copy of [place] Chart?  No - copy requested     No - copy requested  Would patient like information on creating a medical advance directive?  No - Patient declined No - Patient declined No - Patient declined       Current Medications (verified) Outpatient Encounter Medications as of 01/01/2023  Medication Sig   albuterol (VENTOLIN HFA) 108 (90 Base) MCG/ACT inhaler Inhale 1-2 puffs into the lungs every 6 (six) hours as needed for wheezing or shortness of breath.   ALPRAZolam (XANAX) 0.5 MG tablet TAKE ONE TABLET BY MOUTH THREE TIMES DAILY   aspirin 81 MG tablet Take 81 mg by mouth at bedtime.   cefdinir (OMNICEF) 300 MG capsule Take 1 capsule (300 mg total) by mouth 2 (two) times daily.   cyclobenzaprine (FLEXERIL) 5 MG tablet Take 5 mg by mouth 2 (two) times daily as needed.   diclofenac Sodium (VOLTAREN) 1 % GEL Apply 2 g topically 4 (four) times daily. 4 times a day to  both knees   donepezil (ARICEPT) 10 MG tablet Take 1 tablet (10 mg total) by mouth at bedtime.   DULoxetine (CYMBALTA) 60 MG capsule Take 1 capsule (60 mg total) by mouth daily. 1 tab p.o. daily   EPINEPHrine 0.3 mg/0.3 mL IJ SOAJ injection Inject 0.3 mg into the muscle as needed for anaphylaxis.   furosemide (LASIX) 40 MG tablet Take 1 tablet (40 mg total) by mouth daily as needed for fluid.   ipratropium (ATROVENT) 0.03 % nasal spray Place 2 sprays into both nostrils every 12 (twelve) hours as needed (allergies).   isosorbide mononitrate (IMDUR) 30 MG 24 hr tablet TAKE 2 TABLETS IN THE AM AND 1 TABLET IN THE PM.   lamoTRIgine (LAMICTAL) 200 MG tablet Take 1 tablet (200 mg total) by mouth daily.   methocarbamol (ROBAXIN) 500 MG tablet Take 1 tablet (500 mg total) by mouth every 6 (six) hours as needed for muscle spasms.   metoprolol succinate (TOPROL-XL) 100 MG 24 hr tablet take ONE tab daily, take additional 1/2 tab as needed FOR FOR BLOOD PRESSURE > 160/90   Misc Natural Products (NEURIVA PO) Take by mouth.   Multiple Vitamin (MULTIVITAMIN WITH MINERALS) TABS tablet Take 1 tablet by mouth daily.   nitroGLYCERIN (NITROSTAT)  0.4 MG SL tablet Place 1 tablet (0.4 mg total) under the tongue every 5 (five) minutes as needed for chest pain (x 3 doses).   oxyCODONE-acetaminophen (PERCOCET) 10-325 MG tablet Take 1 tablet by mouth every 6 (six) hours as needed for pain. for pain   pantoprazole (PROTONIX) 40 MG tablet Take 1 tablet (40 mg total) by mouth 2 (two) times daily.   polyvinyl alcohol (LIQUIFILM TEARS) 1.4 % ophthalmic solution Place 1 drop into both eyes as needed for dry eyes.   predniSONE (DELTASONE) 10 MG tablet 2 tabs po qd x 5d then 1 tab po qd x 5d   rosuvastatin (CRESTOR) 20 MG tablet Take 1 tablet (20 mg total) by mouth every evening.   solifenacin (VESICARE) 10 MG tablet TAKE 1 TABLET ONCE A DAY.   traZODone (DESYREL) 50 MG tablet take 2-4 tablets at bedtime as needed for insomnia.    [DISCONTINUED] albuterol (VENTOLIN HFA) 108 (90 Base) MCG/ACT inhaler Inhale 1-2 puffs into the lungs every 6 (six) hours as needed for wheezing or shortness of breath.   [DISCONTINUED] ipratropium (ATROVENT) 0.03 % nasal spray Place 2 sprays into both nostrils every 12 (twelve) hours as needed (allergies).   No facility-administered encounter medications on file as of 01/01/2023.    Allergies (verified) Aripiprazole, Penicillins, and Bactrim [sulfamethoxazole-trimethoprim]   History: Past Medical History:  Diagnosis Date   Acute upper GI bleed 06/2014   while pt taking coumadin, plavix, and meloxicam---despite being told not to take coumadin.   Anginal pain ([place])    Nonobstructive CAD 2014; however, her cardiologist put her on a statin for this and NOT for hyperlipidemia per pt report.  Atyp CP 08/2017 at card f/u, plan for myoc perf imaging.   Anxiety    panic attacks   Asthma    w/ asbestososis    BPPV (benign paroxysmal positional vertigo) 12/16/2012   Chronic diastolic CHF (congestive heart failure) (HCC)    dry wt as of 11/06/16 is 168 lbs.   Chronic lower back pain    COPD (chronic obstructive pulmonary disease) (HCC)    DDD (degenerative disc disease)    lumbar and cervical.    Diverticular disease    Fibromyalgia    Patient states dx was around her late 22s but she had sx's for years prior to this.   H/O hiatal hernia    History of pneumonia    hospitalized 12/2011, 02/2013, and 07/2013 [facility]) for this   HTN (hypertension)    Renal artery dopplers 04/2013 neg for stenosis.   Hypervitaminosis D 09/27/2019   over-supplemented.  Stopped vit D and plan recheck 2 mo.   Idiopathic angio-edema-urticaria 72014; 2021   Angioedema component was very minimal.  2021->Dr. Bobbitt (allergist) eval.   Insomnia    Iron deficiency anemia    [place]emat[place] in [place], [place] did [place] w/u; no cause found; failed oral supplement;; gets fairly regular (q43mor so) IV iron infusions (Venofer  -iron sucrose- '200mg'$  with procrit.  "for 14 yr I've been getting blood work q month & getting infusions prn" (07/12/2013).  Dr. EMarin Olplocally, iron infusions done, EPO deficiency dx'd   Kidney stone    Migraine syndrome    "not as often anymore; used to be ~ q wk" (07/12/2013)   Mixed incontinence urge and stress    Nephrolithiasis    "passed all on my own or they are still in there" (07/12/2013)   Neuroleptic induced parkinsonism (HCC) 2018   Dr. TCarles Collet neuro, saw her  11/24/17 and recommended d/c of abilify as first step.  D/c'd abilify and pt got complete recovery.   Oropharyngeal dysphagia    swallowing study speech path 05/2020. Gastric bx's showed gastritis, h pylori NEG   OSA on CPAP    prior to move to [place]--had another sleep study 10/2015 w/pulm Dr. Camillo Flaming.   Osteoarthritis    "severe; progressing fast" (07/12/2013); multiple joints-not surgical candidate for TKR (03/2015).  Triamcinolon knee injections by Dr. Tessa Lerner 12/2017.   Pernicious anemia 08/24/2014   Pleural plaque with presence of asbestos 07/22/2013   Pulmonary embolism ([place]) 07/2013   Dx at [facility] with very small peripheral upper lobe pe 07/2013: pt took coumadin for about 8-9 mo   Pyelonephritis    "several times over the last 30 yr" (07/12/2013)   RBBB (right bundle branch block)    Recurrent major depression ([place])    Recurrent UTI    hx of hospitalization for pyelonephritis; started abx prophylaxis 06/2015   Syncope    Hypotensive; ED visit--Dr. Terrence Dupont did Cath--nonobstructive CAD, EF 55-60%.  In retrospect, suspect pt rx med misuse/polypharmacy   Vertebral compression fracture ([place])    Acute T12 on 02/27/22 (fall).  multiple old thoracic-->neurosurg to do MRI as of 05/2022   Past Surgical History:  Procedure Laterality Date   APPENDECTOMY  1960   AXILLARY SURGERY Left 1978   Multiple "lump" in armpit per pt   BIOPSY  06/17/2020   Gastric bx->gastritis, h pylori neg.  Procedure: BIOPSY;  Surgeon: Carol Ada, MD;  Location:  [facility];  Service: Endoscopy;;   CARDIAC CATHETERIZATION  01/2013   nonobstructive CAD, EF 55-60%   CARDIOVASCULAR STRESS TEST  02/22/2015   Low risk myocard perf imaging; wall motion normal, normal EF   carotid duplex doppler  10/21/2017   R vertebral flow suggestive of possible distal obstruction.  Pt declined further w/u as of 10/29/17 but need to revisit this problem periodically.   COCCYX REMOVAL  1972   DEXA  06/05/2017   T-score -3.1   DILATION AND CURETTAGE OF UTERUS  ? 1970's   ESOPHAGOGASTRODUODENOSCOPY N/A 07/19/2014   Gastritis found + in the setting of supratherapeutic INR, +plavix, + meloxicam.   ESOPHAGOGASTRODUODENOSCOPY (EGD) WITH PROPOFOL N/A 06/17/2020   NO stricture or other prob to explain pt's dysphagia, dilation was done anyway.  Gastric bx's-->gastritis, h pylori neg. Procedure: ESOPHAGOGASTRODUODENOSCOPY (EGD) WITH PROPOFOL;  Surgeon: Carol Ada, MD;  Location: [facility];  Service: Endoscopy;  Laterality: N/A;   EYE SURGERY Left 2012-2013   "injections for ~ 1 yr; don't really know what for" (07/12/2013)   HEEL SPUR SURGERY Left 2008   kidney stone removal Right    KNEE SURGERY  2005   LEFT HEART CATHETERIZATION WITH CORONARY ANGIOGRAM N/A 01/30/2013   Procedure: LEFT HEART CATHETERIZATION WITH CORONARY ANGIOGRAM;  Surgeon: Clent Demark, MD;  Location: [facility];  Service: Cardiovascular;  Laterality: N/A;   MALONEY DILATION  06/17/2020   Procedure: Venia Minks DILATION;  Surgeon: Carol Ada, MD;  Location: [facility];  Service: Endoscopy;;   PLANTAR FASCIA RELEASE Left 2008   SPIROMETRY  04/25/2014   In hosp for acute asthma/COPD flare: mixed obstructive and restrictive lung disease. The FEV1 is severely reduced at 45% predicted.  FEV1 signif decreased compared to prior spirometry 07/23/13.   TENDON RELEASE  1996   Right forearm and hand   TOTAL ABDOMINAL HYSTERECTOMY  1974   TRANSTHORACIC ECHOCARDIOGRAM  01/2013; 04/2014;08/2015; 09/2017    2014--NORMAL.  2015--focal basal  septal hypertrophy, EF 55-60%, grade I diast dysfxn, mild LAE.  08/2015 EF 55-60%, nl LV syst fxn, grade I DD, valves wnl. 10/21/17: EF 65-70%, grd I DD, o/w normal. 02/17/22 EF 70-75%, hyperdynamic LV fxn, grd I DD.   Family History  Problem Relation Age of Onset   Arthritis Mother    Kidney disease Mother    Heart disease Father    Stroke Father    Hypertension Father    Diabetes Father    Heart attack Father    Heart attack Paternal Grandmother    Diabetes Sister        one sister   Hypertension Sister    Asthma Sister    Hypertension Brother    Asthma Brother    Asthma Daughter    Multiple sclerosis Son    Social History   Socioeconomic History   Marital status: Widowed    Spouse name: Not on file   Number of children: 2   Years of education: Not on file   Highest education level: Not on file  Occupational History   Occupation: Retired    Comment: Pharmacist, 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  Relocated to [place] 09/2012 to be closer to her son who has MS.   Husband d 2015--mesothelioma.   Occupation: former Pharmacist, hospital.   Education: masters degree level.   No T/A/Ds.   Lives in [place], independent living.   Social Determinants of Health   Financial Resource Strain: Low Risk  (01/01/2023)   Overall Financial Resource Strain (CARDIA)    Difficulty of Paying Living Expenses: Not hard at all  Food Insecurity: No Food Insecurity (01/01/2023)   Hunger Vital Sign    Worried About Running Out of Food in the Last Year: Never true    Ran Out of Food in the Last Year: Never true  Transportation  Needs: No Transportation Needs (01/01/2023)   PRAPARE - Hydrologist (Medical): No    Lack of Transportation (Non-Medical): No  Physical Activity: Insufficiently Active (01/01/2023)   Exercise Vital Sign    Days of Exercise per Week: 2 days    Minutes of Exercise per Session: 20 min  Stress: No Stress Concern Present (01/01/2023)   [place]    Feeling of Stress : Not at all  Social Connections: Moderately Isolated (01/01/2023)   Social Connection and Isolation Panel [NHANES]    Frequency of Communication with Friends and Family: Three times a week    Frequency of Social Gatherings with Friends and Family: More than three times a week    Attends Religious Services: More than 4 times per year    Active Member of Clubs or Organizations: No    Attends Archivist Meetings: Never    Marital Status: Widowed    Tobacco Counseling Counseling given: Not Answered Tobacco comments: never used tobacco   Clinical Intake:  Pre-visit preparation completed: No  Pain : No/denies pain     Nutritional Risks: None  How  often do you need to have someone help you when you read instructions, pamphlets, or other written materials from your doctor or pharmacy?: 2 - Rarely  Diabetic?no         Activities of Daily Living    01/01/2023    2:06 PM 02/27/2022    3:04 AM  In your present state of health, do you have any difficulty performing the following activities:  Hearing? 0 0  Vision? 1 0  Difficulty concentrating or making decisions? 0 1  Walking or climbing stairs? 0 1  Dressing or bathing? 0 1  Doing errands, shopping? 0 1  Preparing Food and eating ? N   Using the Toilet? N   In the past six months, have you accidently leaked urine? N   Do you have problems with loss of bowel control? N   Managing your Medications? N   Managing your Finances? N   Housekeeping or managing your  Housekeeping? N     Patient Care Team: Tammi Sou, MD as PCP - General (Family Medicine) Marin Olp Rudell Cobb, MD as Consulting Physician (Oncology) Tanda Rockers, MD as Consulting Physician (Pulmonary Disease) Gavin Pound, MD as Consulting Physician (Rheumatology) Meredith Staggers, MD as Consulting Physician (Physical Medicine and Rehabilitation) Hennie Duos, MD as Consulting Physician (Rheumatology) Lelon Perla, MD as Consulting Physician (Cardiology) Gaynelle Arabian, MD as Consulting Physician (Orthopedic Surgery) Troy Sine, MD as Consulting Physician (Cardiology) Tat, Eustace Quail, DO as Consulting Physician (Neurology) Bobbitt, [place] Muta, MD as Consulting Physician (Allergy and Immunology) Newman Pies, MD as Consulting Physician (Neurosurgery) Patricia Nettle, NP as Nurse Practitioner (Neurosurgery)  Indicate any recent Medical Services you may have received from other than [facility] providers in the past year (date may be approximate).     Assessment:   This is a routine wellness examination for [place].  Hearing/Vision screen No results found.  Dietary issues and exercise activities discussed: Current Exercise Habits: Home exercise routine, Type of exercise: walking, Time (Minutes): 20, Frequency (Times/Week): 2, Weekly Exercise (Minutes/Week): 40, Exercise limited by: None identified   Goals Addressed               This Visit's Progress     Patient stated (pt-stated)        She would like to start driving again      Depression Screen    01/01/2023    1:57 PM 01/01/2023    1:06 PM 10/09/2022   12:53 PM 08/07/2022    1:49 PM 06/19/2022    1:10 PM 06/06/2022    1:19 PM 04/01/2022   12:00 PM  PHQ 2/9 Scores  PHQ - 2 Score 2 2 0 0 5  2  PHQ- 9 Score '5 5   13    '$ Exception Documentation      Medical reason     Fall Risk    01/01/2023    2:07 PM 10/09/2022   12:53 PM 08/07/2022    1:49 PM 06/19/2022    1:09 PM 04/01/2022   12:00 PM   [place] in the past year? 0 1 0 0 1  Comment  Last fall reported to PCP in Sept.     Number falls in past yr: 0 1  0 1  Injury with Fall? 0 1  0 1  Risk for fall due to :     History of fall(s);Impaired balance/gait  Follow up Falls evaluation completed    Falls evaluation completed  FALL RISK PREVENTION PERTAINING TO THE HOME:  Any stairs in or around the home? No  If so, are there any without handrails? No  Home free of loose throw rugs in walkways, pet beds, electrical cords, etc? Yes  Adequate lighting in your home to reduce risk of falls? Yes   ASSISTIVE DEVICES UTILIZED TO PREVENT FALLS:  Life alert? No  Use of a cane, walker or w/c? Yes  Grab bars in the bathroom? Yes  Shower chair or bench in shower? Yes  Elevated toilet seat or a handicapped toilet? Yes   TIMED UP AND GO:  Was the test performed? Yes .  Length of time to ambulate 10 feet: 12 sec.   Gait slow and steady without use of assistive device  Cognitive Function:        01/01/2023    2:06 PM  6CIT Screen  What Year? 0 points  What month? 0 points  What time? 0 points  Count back from 20 0 points  Months in reverse 0 points    Immunizations Immunization History  Administered Date(s) Administered   Fluad Quad(high Dose 65+) 09/17/2019, 09/29/2020, 10/11/2021   Influenza Split 08/23/2012   Influenza, High Dose Seasonal PF 10/01/2016, 09/04/2018   Influenza,inj,Quad PF,6+ Mos 09/28/2013, 10/05/2014   Pneumococcal Conjugate-13 05/03/2015   Pneumococcal Polysaccharide-23 03/24/2011    TDAP status: Due, Education has been provided regarding the importance of this vaccine. Advised may receive this vaccine at local pharmacy or Health Dept. Aware to provide a copy of the vaccination record if obtained from local pharmacy or Health Dept. Verbalized acceptance and understanding.  Flu Vaccine status: Due, Education has been provided regarding the importance of this vaccine. Advised may  receive this vaccine at local pharmacy or Health Dept. Aware to provide a copy of the vaccination record if obtained from local pharmacy or Health Dept. Verbalized acceptance and understanding.  Pneumococcal vaccine status: Up to date  Covid-19 vaccine status: Completed vaccines  Qualifies for Shingles Vaccine? Yes   Zostavax completed No   Shingrix Completed?: No.    Education has been provided regarding the importance of this vaccine. Patient has been advised to call insurance company to determine out of pocket expense if they have not yet received this vaccine. Advised may also receive vaccine at local pharmacy or Health Dept. Verbalized acceptance and understanding.  Screening Tests Health Maintenance  Topic Date Due   Hepatitis C Screening  Never done   Medicare Annual Wellness (AWV)  07/04/2022   INFLUENZA VACCINE  07/23/2022   COVID-19 Vaccine (1) 01/17/2023 (Originally 03/19/1951)   Zoster Vaccines- Shingrix (1 of 2) 04/02/2023 (Originally 03/18/1965)   Pneumonia Vaccine 73+ Years old  Completed   DEXA SCAN  Completed   HPV VACCINES  Aged Out   DTaP/Tdap/Td  Discontinued   COLONOSCOPY (Pts 45-24yr Insurance coverage will need to be confirmed)  Discontinued    Health Maintenance  Health Maintenance Due  Topic Date Due   Hepatitis C Screening  Never done   Medicare Annual Wellness (AWV)  07/04/2022   INFLUENZA VACCINE  07/23/2022    Colorectal cancer screening: No longer required.   Mammogram status: No longer required due to age.  Bone Density status: Completed 06/05/2017. Results reflect: Bone density results: OSTEOPOROSIS. Repeat every 2 years.  Lung Cancer Screening: (Low Dose CT Chest recommended if Age 77-80years, 30 pack-year currently smoking OR have quit w/in 15years.) does not qualify.   Lung Cancer Screening Referral: n/a  Additional Screening:  Hepatitis C Screening: does qualify; Completed n/a  Vision Screening: Recommended annual ophthalmology exams  for early detection of glaucoma and other disorders of the eye. Is the patient up to date with their annual eye exam?  Yes  Who is the provider or what is the name of the office in which the patient attends annual eye exams? unsure If pt is not established with a provider, would they like to be referred to a provider to establish care? No .   Dental Screening: Recommended annual dental exams for proper oral hygiene  Community Resource Referral / Chronic Care Management: CRR required this visit?  No   CCM required this visit?  No      Plan:     I have personally reviewed and noted the following in the patient's chart:   Medical and social history Use of alcohol, tobacco or illicit drugs  Current medications and supplements including opioid prescriptions. Patient is not currently taking opioid prescriptions. Functional ability and status Nutritional status Physical activity Advanced directives List of other physicians Hospitalizations, surgeries, and ER visits in previous 12 months Vitals Screenings to include cognitive, depression, and falls Referrals and appointments  In addition, I have reviewed and discussed with patient certain preventive protocols, quality metrics, and best practice recommendations. A written personalized care plan for preventive services as well as general preventive health recommendations were provided to patient.     Beatrix Fetters, [place]   01/01/2023   Nurse Notes: Non-Face to Face or Face to Face 6 minute visit Encounter     Linda [name] , Thank you for taking time to come for your Medicare Wellness Visit. I appreciate your ongoing commitment to your health goals. Please review the following plan we discussed and let me know if I can assist you in the future.   These are the goals we discussed:  Goals       Patient Stated (pt-stated)      Will make appt with MD for hospitalization follow up.      Patient Stated      Pt will buy high protein  items to have in her apt to supplement food in dining room.      Patient Stated      Would like to walk with less pain      Patient stated (pt-stated)      She would like to start driving again        This is a list of the screening recommended for you and due dates:  Health Maintenance  Topic Date Due   Hepatitis C Screening: USPSTF Recommendation to screen - Ages 93-79 yo.  Never done   Medicare Annual Wellness Visit  07/04/2022   Flu Shot  07/23/2022   COVID-19 Vaccine (1) 01/17/2023*   Zoster (Shingles) Vaccine (1 of 2) 04/02/2023*   Pneumonia Vaccine  Completed   DEXA scan (bone density measurement)  Completed   HPV Vaccine  Aged Out   DTaP/Tdap/Td vaccine  Discontinued   Colon Cancer Screening  Discontinued  *Topic was postponed. The date shown is not the original due date.

## 2023-01-02 ENCOUNTER — Other Ambulatory Visit: Payer: Self-pay | Admitting: Family Medicine

## 2023-01-02 LAB — CBC
HCT: 40 % (ref 36.0–46.0)
Hemoglobin: 13.4 g/dL (ref 12.0–15.0)
MCHC: 33.6 g/dL (ref 30.0–36.0)
MCV: 87.3 fl (ref 78.0–100.0)
Platelets: 214 10*3/uL (ref 150.0–400.0)
RBC: 4.59 Mil/uL (ref 3.87–5.11)
RDW: 14.9 % (ref 11.5–15.5)
WBC: 9.3 10*3/uL (ref 4.0–10.5)

## 2023-01-02 LAB — LIPID PANEL
Cholesterol: 113 mg/dL (ref 0–200)
HDL: 42.2 mg/dL (ref >39.00)
LDL Cholesterol: 51 mg/dL (ref 0–99)
NonHDL: 71.13
Total CHOL/HDL Ratio: 3
Triglycerides: 102 mg/dL (ref 0.0–149.0)
VLDL: 20.4 mg/dL (ref 0.0–40.0)

## 2023-01-02 LAB — BASIC METABOLIC PANEL
BUN: 22 mg/dL (ref 6–23)
CO2: 31 mEq/L (ref 19–32)
Calcium: 8.6 mg/dL (ref 8.4–10.5)
Chloride: 99 mEq/L (ref 96–112)
Creatinine, Ser: 1.08 mg/dL (ref 0.40–1.20)
GFR: 49.8 mL/min — ABNORMAL LOW (ref >60.00)
Glucose, Bld: 88 mg/dL (ref 70–99)
Potassium: 3.8 mEq/L (ref 3.5–5.1)
Sodium: 139 mEq/L (ref 135–145)

## 2023-01-03 ENCOUNTER — Telehealth: Payer: Self-pay | Admitting: Family Medicine

## 2023-01-03 NOTE — Telephone Encounter (Signed)
OK. I will take lasix off her med list in order to try to avoid confusion with her in future.

## 2023-01-03 NOTE — Telephone Encounter (Signed)
Please fill, if appropriate.

## 2023-01-03 NOTE — Telephone Encounter (Signed)
Sent as FYI  Patient stated she has not been on Lasix for over a year so it should not affect her kidney function. She was previously advised "to remain off Lasix" as recommended by her provider an we were not saying she has taken Lasix recently. She should continue to work on proper hydration, 65 ounces of clear fluids daily. She wanted to inform Dr.McGowen of this and if not, she would let him know during her appointment next week.

## 2023-01-03 NOTE — Telephone Encounter (Signed)
Informed patient of her lab results, she understood the results and recommendations. She mentioned however she has been off Lasix for at least a year now but the pharmacy continued to make attempts to refill it. Patient did want a call back to discuss this further.

## 2023-01-04 LAB — URINE CULTURE
MICRO NUMBER:: 14413403
SPECIMEN QUALITY:: ADEQUATE

## 2023-01-06 ENCOUNTER — Telehealth: Payer: Self-pay

## 2023-01-06 NOTE — Telephone Encounter (Signed)
Patient does not have transportation for follow up on Friday 01/19. She wanted to know if this could be a phone visit? We have tried several times to complete virtual visit but unsuccessful attempts.

## 2023-01-08 ENCOUNTER — Other Ambulatory Visit: Payer: Self-pay | Admitting: Cardiology

## 2023-01-08 DIAGNOSIS — I2510 Atherosclerotic heart disease of native coronary artery without angina pectoris: Secondary | ICD-10-CM

## 2023-01-08 NOTE — Telephone Encounter (Signed)
Yes okay 

## 2023-01-09 NOTE — Telephone Encounter (Signed)
Pt advised.

## 2023-01-10 ENCOUNTER — Ambulatory Visit (INDEPENDENT_AMBULATORY_CARE_PROVIDER_SITE_OTHER): Payer: Medicare Other | Admitting: Family Medicine

## 2023-01-10 ENCOUNTER — Encounter: Payer: Self-pay | Admitting: Family Medicine

## 2023-01-10 ENCOUNTER — Telehealth: Payer: Self-pay | Admitting: *Deleted

## 2023-01-10 VITALS — BP 125/73 | HR 65 | Temp 97.2°F | Ht 64.0 in | Wt 139.4 lb

## 2023-01-10 DIAGNOSIS — R072 Precordial pain: Secondary | ICD-10-CM | POA: Diagnosis not present

## 2023-01-10 DIAGNOSIS — I10 Essential (primary) hypertension: Secondary | ICD-10-CM

## 2023-01-10 DIAGNOSIS — R7989 Other specified abnormal findings of blood chemistry: Secondary | ICD-10-CM

## 2023-01-10 DIAGNOSIS — J209 Acute bronchitis, unspecified: Secondary | ICD-10-CM

## 2023-01-10 DIAGNOSIS — I2510 Atherosclerotic heart disease of native coronary artery without angina pectoris: Secondary | ICD-10-CM

## 2023-01-10 DIAGNOSIS — R0989 Other specified symptoms and signs involving the circulatory and respiratory systems: Secondary | ICD-10-CM

## 2023-01-10 DIAGNOSIS — N390 Urinary tract infection, site not specified: Secondary | ICD-10-CM

## 2023-01-10 LAB — BASIC METABOLIC PANEL
BUN: 14 mg/dL (ref 6–23)
CO2: 32 mEq/L (ref 19–32)
Calcium: 8.6 mg/dL (ref 8.4–10.5)
Chloride: 101 mEq/L (ref 96–112)
Creatinine, Ser: 0.76 mg/dL (ref 0.40–1.20)
GFR: 75.9 mL/min (ref >60.00)
Glucose, Bld: 82 mg/dL (ref 70–99)
Potassium: 3.9 mEq/L (ref 3.5–5.1)
Sodium: 141 mEq/L (ref 135–145)

## 2023-01-10 MED ORDER — AMLODIPINE BESYLATE 5 MG PO TABS: 5.0000 mg | ORAL_TABLET | Freq: Every day | ORAL | 0 refills | Status: DC

## 2023-01-10 NOTE — Progress Notes (Signed)
OFFICE VISIT  01/10/2023  CC:  Chief Complaint  Patient presents with   Medical Management of Chronic Issues    Patient is a 77 y.o. female who presents for 9-day follow-up hypertension, bronchitis, and UTI. A/P as of last visit: "#1 labile hypertension. Blood pressure low here in the office today.  She will restart her Toprol 100 mg only when blood pressure rises over 130/80.  Keep plan of taking an additional half tab for blood pressure greater than 160/90. Electrolytes and creatinine today.   2.  URI with bronchitis. Prednisone 20 mg a day x 5 days and then 10 mg a day x 5 days.  Refilled albuterol inhaler. Cefdinir 300 mg twice daily x 7 days.   #3 urinary tract infection, acute.  She has a history of recurrent UTI. UA today with 1+ leukocytes. Sent urine specimen for culture and sensitivities.   #4 recurrent major depressive disorder resistant to treatment.  GAD.  Insomnia. All stable on Lamictal 200 mg a day, duloxetine 60 mg a day, alprazolam 0.5 mg 3 times daily, and trazodone 50 mg, 2 to 4 tablets nightly as needed.   #5 hypercholesterolemia, doing well on rosuvastatin 20 mg a day Goal LDL less than 70. Lipid panel today."  INTERIM HX: Her urine grew E. coli, pansensitive. CBC, BMet, and lipid panel all normal except GFR was down to 50 (sCr was 1.08 and her baseline is 0.8).  Finished her prednisone and antibiotic. URI and cough significantly improved. Breathing feels improved. Urinary symptoms much improved.  She has still been having high blood pressures in the evening, at times up into the 190s over 110.  She has a headache and feels dizzy when this happens.  Says face turns red as well. She takes an extra Toprol 100 mg tab when this happens. Denies any low blood pressure since I saw her last.  She says that every 2-3 nights lately she has been having chest pain.  Nonexertional.  Substernal with pressure. 1 time recently she took Tums without improvement.  She  then took nitroglycerin and this helped after the third tablet.  Most recent was 2 nights ago.   Past Medical History:  Diagnosis Date   Acute upper GI bleed 06/2014   while pt taking coumadin, plavix, and meloxicam---despite being told not to take coumadin.   Anginal pain ([place])    Nonobstructive CAD 2014; however, her cardiologist put her on a statin for this and NOT for hyperlipidemia per pt report.  Atyp CP 08/2017 at card f/u, plan for myoc perf imaging.   Anxiety    panic attacks   Asthma    w/ asbestososis    BPPV (benign paroxysmal positional vertigo) 12/16/2012   Chronic diastolic CHF (congestive heart failure) (HCC)    dry wt as of 11/06/16 is 168 lbs.   Chronic lower back pain    COPD (chronic obstructive pulmonary disease) (HCC)    DDD (degenerative disc disease)    lumbar and cervical.    Diverticular disease    Fibromyalgia    Patient states dx was around her late 40s but she had sx's for years prior to this.   H/O hiatal hernia    History of pneumonia    hospitalized 12/2011, 02/2013, and 07/2013 [facility]) for this   HTN (hypertension)    Renal artery dopplers 04/2013 neg for stenosis.   Hypervitaminosis D 09/27/2019   over-supplemented.  Stopped vit D and plan recheck 2 mo.   Idiopathic angio-edema-urticaria [id number];  2021   Angioedema component was very minimal.  2021->Dr. Bobbitt (allergist) eval.   Insomnia    Iron deficiency anemia    [place]emat[place] in [place], [place] did extensive w/u; no cause found; failed oral supplement;; gets fairly regular (q54mor so) IV iron infusions (Venofer -iron sucrose- '200mg'$  with procrit.  "for 14 yr I've been getting blood work q month & getting infusions prn" (07/12/2013).  Dr. EMarin Olplocally, iron infusions done, EPO deficiency dx'd   Kidney stone    Migraine syndrome    "not as often anymore; used to be ~ q wk" (07/12/2013)   Mixed incontinence urge and stress    Nephrolithiasis    "passed all on my own or they are still in there"  (07/12/2013)   Neuroleptic induced parkinsonism (HCC) 2018   Dr. TCarles Collet neuro, saw her 11/24/17 and recommended d/c of abilify as first step.  D/c'd abilify and pt got complete recovery.   Oropharyngeal dysphagia    swallowing study speech path 05/2020. Gastric bx's showed gastritis, h pylori NEG   OSA on CPAP    prior to move to [place]--had another sleep study 10/2015 w/pulm Dr. ECamillo Flaming   Osteoarthritis    "severe; progressing fast" (07/12/2013); multiple joints-not surgical candidate for TKR (03/2015).  Triamcinolon knee injections by Dr. STessa Lerner1/2019.   Pernicious anemia 08/24/2014   Pleural plaque with presence of asbestos 07/22/2013   [place] 07[place]   [facility]: pt took coumadin for about 8-9 mo   Pyelonephritis    "several times over the last 30 yr" (07/12/2013)   RBBB (right bundle branch block)    [place]    Recurrent UTI    hx of hospitalization for pyelonephritis; started abx prophylaxis 06/2015   Syncope    Hypotensive; ED visit--Dr. HTerrence Dupontdid Cath--nonobstructive CAD, EF 55-60%.  In retrospect, suspect pt rx med misuse/polypharmacy   Vertebral compression fracture ([place]    Acute T12 on 02/27/22 (fall).  multiple old thoracic-->neurosurg to do MRI as of 05/2022    Past Surgical History:  Procedure Laterality Date   APPENDECTOMY  1960   AXILLARY SURGERY Left 1978   Multiple "lump" in armpit per pt   BIOPSY  06/17/2020   Gastric bx->gastritis, h pylori neg.  Procedure: BIOPSY;  Surgeon: HCarol Ada MD;  Location: [facility];  Service: Endoscopy;;   CARDIAC CATHETERIZATION  01/2013   nonobstructive CAD, EF 55-60%   CARDIOVASCULAR STRESS TEST  02/22/2015   Low risk myocard perf imaging; wall motion normal, normal EF   CARDIOVASCULAR STRESS TEST     09/2017 myo perf low risk   carotid duplex doppler  10/21/2017   R vertebral flow suggestive of possible distal obstruction.  Pt declined  further w/u as of 10/29/17 but need to revisit this problem periodically.   COCCYX REMOVAL  1972   DEXA  06/05/2017   T-score -3.1   DILATION AND CURETTAGE OF UTERUS  ? 1970's   ESOPHAGOGASTRODUODENOSCOPY N/A 07/19/2014   Gastritis found + in the setting of supratherapeutic INR, +plavix, + meloxicam.   ESOPHAGOGASTRODUODENOSCOPY (EGD) WITH PROPOFOL N/A 06/17/2020   NO stricture or other prob to explain pt's dysphagia, dilation was done anyway.  Gastric bx's-->gastritis, h pylori neg. Procedure: ESOPHAGOGASTRODUODENOSCOPY (EGD) WITH PROPOFOL;  Surgeon: HCarol Ada MD;  Location: [facility];  Service: Endoscopy;  Laterality: N/A;   EYE SURGERY Left 2012-2013   "injections for ~ 1 yr; don't really know what  for" (07/12/2013)   HEEL SPUR SURGERY Left 2008   kidney stone removal Right    KNEE SURGERY  2005   LEFT HEART CATHETERIZATION WITH CORONARY ANGIOGRAM N/A 01/30/2013   Procedure: LEFT HEART CATHETERIZATION WITH CORONARY ANGIOGRAM;  Surgeon: Clent Demark, MD;  Location: [facility];  Service: Cardiovascular;  Laterality: N/A;   MALONEY DILATION  06/17/2020   Procedure: Venia Minks DILATION;  Surgeon: Carol Ada, MD;  Location: [facility];  Service: Endoscopy;;   PLANTAR FASCIA RELEASE Left 2008   SPIROMETRY  04/25/2014   In hosp for acute asthma/COPD flare: mixed obstructive and restrictive lung disease. The FEV1 is severely reduced at 45% predicted.  FEV1 signif decreased compared to prior spirometry 07/23/13.   TENDON RELEASE  1996   Right forearm and hand   TOTAL ABDOMINAL HYSTERECTOMY  1974   TRANSTHORACIC ECHOCARDIOGRAM  01/2013; 04/2014;08/2015; 09/2017   2014--NORMAL.  2015--focal basal septal hypertrophy, EF 55-60%, grade I diast dysfxn, mild LAE.  08/2015 EF 55-60%, nl LV syst fxn, grade I DD, valves wnl. 10/21/17: EF 65-70%, grd I DD, o/w normal. 02/17/22 EF 70-75%, hyperdynamic LV fxn, grd I DD.    Outpatient Medications Prior to Visit  Medication Sig Dispense Refill    albuterol (VENTOLIN HFA) 108 (90 Base) MCG/ACT inhaler Inhale 1-2 puffs into the lungs every 6 (six) hours as needed for wheezing or shortness of breath. 18 g 5   ALPRAZolam (XANAX) 0.5 MG tablet TAKE ONE TABLET BY MOUTH THREE TIMES DAILY 90 tablet 5   aspirin 81 MG tablet Take 81 mg by mouth at bedtime.     diclofenac Sodium (VOLTAREN) 1 % GEL Apply 2 g topically 4 (four) times daily. 4 times a day to both knees 1000 each 4   donepezil (ARICEPT) 10 MG tablet Take 1 tablet (10 mg total) by mouth at bedtime. 90 tablet 1   DULoxetine (CYMBALTA) 60 MG capsule Take 1 capsule (60 mg total) by mouth daily. 1 tab p.o. daily 90 capsule 1   ipratropium (ATROVENT) 0.03 % nasal spray Place 2 sprays into both nostrils every 12 (twelve) hours as needed (allergies). 30 mL 5   isosorbide mononitrate (IMDUR) 30 MG 24 hr tablet TAKE 2 TABLETS IN THE AM AND 1 TABLET IN THE PM. 270 tablet 0   lamoTRIgine (LAMICTAL) 200 MG tablet Take 1 tablet (200 mg total) by mouth daily. 90 tablet 1   methocarbamol (ROBAXIN) 500 MG tablet Take 1 tablet (500 mg total) by mouth every 6 (six) hours as needed for muscle spasms. 80 tablet 2   metoprolol succinate (TOPROL-XL) 100 MG 24 hr tablet take ONE tab daily, take additional 1/2 tab as needed FOR FOR BLOOD PRESSURE > 160/90 135 tablet 1   Misc Natural Products (NEURIVA PO) Take by mouth.     Multiple Vitamin (MULTIVITAMIN WITH MINERALS) TABS tablet Take 1 tablet by mouth daily.     nitroGLYCERIN (NITROSTAT) 0.4 MG SL tablet Place 1 tablet (0.4 mg total) under the tongue every 5 (five) minutes as needed for chest pain (x 3 doses). 25 tablet 11   oxyCODONE-acetaminophen (PERCOCET) 10-325 MG tablet Take 1 tablet by mouth every 6 (six) hours as needed for pain. for pain 120 tablet 0   pantoprazole (PROTONIX) 40 MG tablet Take 1 tablet (40 mg total) by mouth 2 (two) times daily. 180 tablet 1   rOPINIRole (REQUIP) 0.5 MG tablet TAKE ONE TABLET BY MOUTH AT BEDTIME 90 tablet 1    rosuvastatin (CRESTOR) 20 MG  tablet Take 1 tablet (20 mg total) by mouth every evening. 90 tablet 1   solifenacin (VESICARE) 10 MG tablet TAKE 1 TABLET ONCE A DAY. 90 tablet 0   traZODone (DESYREL) 50 MG tablet take 2-4 tablets at bedtime as needed for insomnia. 120 tablet 5   cyclobenzaprine (FLEXERIL) 5 MG tablet Take 5 mg by mouth 2 (two) times daily as needed. (Patient not taking: Reported on 01/10/2023)     EPINEPHrine 0.3 mg/0.3 mL IJ SOAJ injection Inject 0.3 mg into the muscle as needed for anaphylaxis. (Patient not taking: Reported on 01/10/2023) 1 each 1   cefdinir (OMNICEF) 300 MG capsule Take 1 capsule (300 mg total) by mouth 2 (two) times daily. (Patient not taking: Reported on 01/10/2023) 14 capsule 0   polyvinyl alcohol (LIQUIFILM TEARS) 1.4 % ophthalmic solution Place 1 drop into both eyes as needed for dry eyes. (Patient not taking: Reported on 01/10/2023)     predniSONE (DELTASONE) 10 MG tablet 2 tabs po qd x 5d then 1 tab po qd x 5d (Patient not taking: Reported on 01/10/2023) 15 tablet 0   No facility-administered medications prior to visit.    Allergies  Allergen Reactions   Aripiprazole Other (See Comments) and Rash    parkinsonism Other reaction(s): Other parkinsonism  parkinsonism parkinsonism   Penicillins Itching, Swelling, Rash and Shortness Of Breath    Tolerated Cefepime in ED. Has patient had a PCN reaction causing immediate rash, facial/tongue/throat swelling, SOB or lightheadedness with hypotension: Yes Has patient had a PCN reaction causing severe rash involving mucus membranes or skin necrosis: No Has patient had a PCN reaction that required hospitalization: No  Has patient had a PCN reaction occurring within the last 10 years: No  Tolerated Cefepime in ED. Has patient had a PCN reaction causing immediate rash, facial/tongue/throat swelling, SOB or lightheadedness with hypotension: Yes Has patient had a PCN reaction causing severe rash involving mucus membranes  or skin necrosis: No Has patient had a PCN reaction that required hospitalization: No  Has patient had a PCN reaction occurring within the last 10 years: No Rash,swelling Tolerated Cefepime in ED.  Rash,swelling  Tolerated Cefepime in ED.  Tolerated Cefepime in ED. Has patient had a PCN reaction causing immediate rash, facial/tongue/throat swelling, SOB or lightheadedness with hypotension: Yes Has patient had a PCN reaction causing severe rash involving mucus membranes or skin necrosis: No Has patient had a PCN reaction that required hospitalization: No  Has patient had a PCN reaction occurring within the last 10 years: No   Bactrim [Sulfamethoxazole-Trimethoprim] Rash    Review of Systems As per HPI  PE:    01/10/2023    1:16 PM 01/01/2023    2:07 PM 01/01/2023    1:00 PM  Vitals with BMI  Height '5\' 4"'$  '5\' 4"'$  '5\' 4"'$   Weight 139 lbs 6 oz 135 lbs 3 oz 135 lbs 3 oz  BMI 23.92 07.3 71.0  Systolic 626 80 80  Diastolic 73 45 45  Pulse 65 76 76  02 sat 92% RA today   Physical Exam  Gen: Alert, well appearing.  Patient is oriented to person, place, time, and situation. AFFECT: pleasant, lucid thought and speech. CV: RRR, no m/r/g.   LUNGS: CTA bilat, nonlabored resps, good aeration in all lung fields. EXT: no clubbing or cyanosis.  no edema.    LABS:  Last CBC Lab Results  Component Value Date   WBC 9.3 01/01/2023   HGB 13.4 01/01/2023   HCT 40.0  01/01/2023   MCV 87.3 01/01/2023   MCH 29.2 03/26/2022   RDW 14.9 01/01/2023   PLT 214.0 08/81/1031   Last metabolic panel Lab Results  Component Value Date   GLUCOSE 88 01/01/2023   NA 139 01/01/2023   K 3.8 01/01/2023   CL 99 01/01/2023   CO2 31 01/01/2023   BUN 22 01/01/2023   CREATININE 1.08 01/01/2023   GFRNONAA >60 03/26/2022   CALCIUM 8.6 01/01/2023   PHOS 4.0 02/27/2022   PROT 7.3 03/26/2022   ALBUMIN 4.0 03/26/2022   BILITOT 0.3 03/26/2022   ALKPHOS 65 03/26/2022   AST 18 03/26/2022   ALT 9 03/26/2022    ANIONGAP 8 03/26/2022   Last hemoglobin A1c Lab Results  Component Value Date   HGBA1C 5.3 02/27/2022   Last thyroid functions Lab Results  Component Value Date   TSH 1.488 02/27/2022   Twelve-lead EKG today: Sinus rhythm, right bundle branch block, no ST segment abnormalities, no ectopy. No change compared to past EKGs.  IMPRESSION AND PLAN:  #1 URI with bronchitis. Resolving appropriately status post prednisone and Omnicef.  2.  UTI, resolved with Omnicef.  3.  Uncontrolled hypertension.  She has had a significant history of labile blood pressures. Will cautiously add amlodipine at 5 mg every evening.  Continue Toprol-XL 100 mg every morning. Rechecking basic metabolic panel today.  4.  Chest pain. EKG today shows right bundle branch block otherwise normal (no change)--> I consulted with Dr. Stanford Breed about this today and he agreed. History of nonobstructive coronary artery disease. Nonurgent follow-up with cardiology recommended--we have helped arrange appointment for about 2 weeks from now. In the meantime, continue aspirin, Imdur, Toprol, and rosuvastatin. Working on better blood pressure control.  An After Visit Summary was printed and given to the patient.  FOLLOW UP: Return in about 1 week (around 01/17/2023) for virtual ok---f/u bp.  Signed:  Crissie Sickles, MD           01/10/2023

## 2023-01-10 NOTE — Telephone Encounter (Signed)
ECG reviewed by dr Stanford Breed and nothing acute ST changes noted.

## 2023-01-13 ENCOUNTER — Ambulatory Visit: Payer: Medicare Other | Admitting: Registered Nurse

## 2023-01-17 ENCOUNTER — Telehealth: Payer: Medicare Other | Admitting: Family Medicine

## 2023-01-17 ENCOUNTER — Encounter: Payer: Self-pay | Admitting: Family Medicine

## 2023-01-17 DIAGNOSIS — I10 Essential (primary) hypertension: Secondary | ICD-10-CM

## 2023-01-17 NOTE — Progress Notes (Unsigned)
Virtual Visit via Video Note  I connected with Linda [name]  on 01/17/23 at  2:00 PM EST by a video enabled telemedicine application and verified that I am speaking with the correct person using two identifiers.  Location patient: [place] Location provider:work or home office Persons participating in the virtual visit: patient, provider  I discussed the limitations and requested verbal permission for telemedicine visit. The patient expressed understanding and agreed to proceed.  CC:  77 y/o female being seen today for 1 wk f/u uncontrolled hypertension. A/P as of last visit: "1 URI with bronchitis. Resolving appropriately status post prednisone and Omnicef.   2.  UTI, resolved with Omnicef.   3.  Uncontrolled hypertension.  She has had a significant history of labile blood pressures. Will cautiously add amlodipine at 5 mg every evening.  Continue Toprol-XL 100 mg every morning. Rechecking basic metabolic panel today.   4.  Chest pain. EKG today shows right bundle branch block otherwise normal (no change)--> I consulted with Dr. Stanford Breed about this today and he agreed. History of nonobstructive coronary artery disease. Nonurgent follow-up with cardiology recommended--we have helped arrange appointment for about 2 weeks from now. In the meantime, continue aspirin, Imdur, Toprol, and rosuvastatin. Working on better blood pressure control."  INTERIM HX: ***   ROS: See pertinent positives and negatives per HPI.  Past Medical History:  Diagnosis Date   Acute upper GI bleed 06/2014   while pt taking coumadin, plavix, and meloxicam---despite being told not to take coumadin.   Anginal pain ([place])    Nonobstructive CAD 2014; however, her cardiologist put her on a statin for this and NOT for hyperlipidemia per pt report.  Atyp CP 08/2017 at card f/u, plan for myoc perf imaging.   Anxiety    panic attacks   Asthma    w/ asbestososis    BPPV (benign paroxysmal positional vertigo) 12/16/2012    Chronic diastolic CHF (congestive heart failure) (HCC)    dry wt as of 11/06/16 is 168 lbs.   Chronic lower back pain    COPD (chronic obstructive pulmonary disease) (HCC)    DDD (degenerative disc disease)    lumbar and cervical.    Diverticular disease    Fibromyalgia    Patient states dx was around her late 72s but she had sx's for years prior to this.   H/O hiatal hernia    History of pneumonia    hospitalized 12/2011, 02/2013, and 07/2013 [facility]) for this   HTN (hypertension)    Renal artery dopplers 04/2013 neg for stenosis.   Hypervitaminosis D 09/27/2019   over-supplemented.  Stopped vit D and plan recheck 2 mo.   Idiopathic angio-edema-urticaria 72014; 2021   Angioedema component was very minimal.  2021->Dr. Bobbitt (allergist) eval.   Insomnia    Iron deficiency anemia    Hematologist in [place], [place] did extensive w/u; no cause found; failed oral supplement;; gets fairly regular ([id number] [id number]) IV iron infusions (Venofer -iron sucrose- '200mg'$  with procrit.  "for 14 yr I've been getting blood work q month & getting infusions prn" (07/12/2013).  Dr. EMarin Olplocally, iron infusions done, EPO deficiency dx'd   Kidney stone    Migraine syndrome    "not as often anymore; used to be ~ q wk" (07/12/2013)   Mixed incontinence urge and stress    Nephrolithiasis    "passed all on my own or they are still in there" (07/12/2013)   Neuroleptic induced parkinsonism (HCC) 2018   Dr. TCarles Collet neuro, saw  her 11/24/17 and recommended d/c of abilify as first step.  D/c'd abilify and pt got complete recovery.   Oropharyngeal dysphagia    swallowing study speech path 05/2020. Gastric bx's showed gastritis, h pylori NEG   OSA on CPAP    prior to move to [place]--had another sleep study 10/2015 w/pulm Dr. Camillo Flaming.   Osteoarthritis    "severe; progressing fast" (07/12/2013); multiple joints-not surgical candidate for TKR (03/2015).  Triamcinolon knee injections by Dr. Tessa Lerner 12/2017.   Pernicious anemia 08/24/2014    Pleural plaque with presence of asbestos 07/22/2013   Pulmonary embolism ([place]) 07/2013   Dx at [facility] with very small peripheral upper lobe pe 07/2013: pt took coumadin for about 8-9 mo   Pyelonephritis    "several times over the last 30 yr" (07/12/2013)   RBBB (right bundle branch block)    Recurrent major depression ([place])    Recurrent UTI    hx of hospitalization for pyelonephritis; started abx prophylaxis 06/2015   Syncope    Hypotensive; ED visit--Dr. Terrence Dupont did Cath--nonobstructive CAD, EF 55-60%.  In retrospect, suspect pt rx med misuse/polypharmacy   Vertebral compression fracture ([place])    Acute T12 on 02/27/22 (fall).  multiple old thoracic-->neurosurg to do MRI as of 05/2022    Past Surgical History:  Procedure Laterality Date   APPENDECTOMY  1960   AXILLARY SURGERY Left 1978   Multiple "lump" in armpit per pt   BIOPSY  06/17/2020   Gastric bx->gastritis, h pylori neg.  Procedure: BIOPSY;  Surgeon: Carol Ada, MD;  Location: [facility];  Service: Endoscopy;;   CARDIAC CATHETERIZATION  01/2013   nonobstructive CAD, EF 55-60%   CARDIOVASCULAR STRESS TEST  02/22/2015   Low risk myocard perf imaging; wall motion normal, normal EF   CARDIOVASCULAR STRESS TEST     09/2017 myo perf low risk   carotid duplex doppler  10/21/2017   R vertebral flow suggestive of possible distal obstruction.  Pt declined further w/u as of 10/29/17 but need to revisit this problem periodically.   COCCYX REMOVAL  1972   DEXA  06/05/2017   T-score -3.1   DILATION AND CURETTAGE OF UTERUS  ? 1970's   ESOPHAGOGASTRODUODENOSCOPY N/A 07/19/2014   Gastritis found + in the setting of supratherapeutic INR, +plavix, + meloxicam.   ESOPHAGOGASTRODUODENOSCOPY (EGD) WITH PROPOFOL N/A 06/17/2020   NO stricture or other prob to explain pt's dysphagia, dilation was done anyway.  Gastric bx's-->gastritis, h pylori neg. Procedure: ESOPHAGOGASTRODUODENOSCOPY (EGD) WITH PROPOFOL;  Surgeon: Carol Ada, MD;  Location: [facility]  ENDOSCOPY;  Service: Endoscopy;  Laterality: N/A;   EYE SURGERY Left 2012-2013   "injections for ~ 1 yr; don't really know what for" (07/12/2013)   HEEL SPUR SURGERY Left 2008   kidney stone removal Right    KNEE SURGERY  2005   LEFT HEART CATHETERIZATION WITH CORONARY ANGIOGRAM N/A 01/30/2013   Procedure: LEFT HEART CATHETERIZATION WITH CORONARY ANGIOGRAM;  Surgeon: Clent Demark, MD;  Location: [place] CATH LAB;  Service: Cardiovascular;  Laterality: N/A;   MALONEY DILATION  06/17/2020   Procedure: Venia Minks DILATION;  Surgeon: Carol Ada, MD;  Location: [facility];  Service: Endoscopy;;   PLANTAR FASCIA RELEASE Left 2008   SPIROMETRY  04/25/2014   In hosp for acute asthma/COPD flare: mixed obstructive and restrictive lung disease. The FEV1 is severely reduced at 45% predicted.  FEV1 signif decreased compared to prior spirometry 07/23/13.   TENDON RELEASE  1996   Right forearm and hand   TOTAL ABDOMINAL HYSTERECTOMY  1974   TRANSTHORACIC ECHOCARDIOGRAM  01/2013; 04/2014;08/2015; 09/2017   2014--NORMAL.  2015--focal basal septal hypertrophy, EF 55-60%, grade I diast dysfxn, mild LAE.  08/2015 EF 55-60%, nl LV syst fxn, grade I DD, valves wnl. 10/21/17: EF 65-70%, grd I DD, o/w normal. 02/17/22 EF 70-75%, hyperdynamic LV fxn, grd I DD.     Current Outpatient Medications:    albuterol (VENTOLIN HFA) 108 (90 Base) MCG/ACT inhaler, Inhale 1-2 puffs into the lungs every 6 (six) hours as needed for wheezing or shortness of breath., Disp: 18 g, Rfl: 5   ALPRAZolam (XANAX) 0.5 MG tablet, TAKE ONE TABLET BY MOUTH THREE TIMES DAILY, Disp: 90 tablet, Rfl: 5   amLODipine (NORVASC) 5 MG tablet, Take 1 tablet (5 mg total) by mouth daily., Disp: 30 tablet, Rfl: 0   aspirin 81 MG tablet, Take 81 mg by mouth at bedtime., Disp: , Rfl:    cyclobenzaprine (FLEXERIL) 5 MG tablet, Take 5 mg by mouth 2 (two) times daily as needed. (Patient not taking: Reported on 01/10/2023), Disp: , Rfl:    diclofenac Sodium (VOLTAREN)  1 % GEL, Apply 2 g topically 4 (four) times daily. 4 times a day to both knees, Disp: 1000 each, Rfl: 4   donepezil (ARICEPT) 10 MG tablet, Take 1 tablet (10 mg total) by mouth at bedtime., Disp: 90 tablet, Rfl: 1   DULoxetine (CYMBALTA) 60 MG capsule, Take 1 capsule (60 mg total) by mouth daily. 1 tab p.o. daily, Disp: 90 capsule, Rfl: 1   EPINEPHrine 0.3 mg/0.3 mL IJ SOAJ injection, Inject 0.3 mg into the muscle as needed for anaphylaxis. (Patient not taking: Reported on 01/10/2023), Disp: 1 each, Rfl: 1   ipratropium (ATROVENT) 0.03 % nasal spray, Place 2 sprays into both nostrils every 12 (twelve) hours as needed (allergies)., Disp: 30 mL, Rfl: 5   isosorbide mononitrate (IMDUR) 30 MG 24 hr tablet, TAKE 2 TABLETS IN THE AM AND 1 TABLET IN THE PM., Disp: 270 tablet, Rfl: 0   lamoTRIgine (LAMICTAL) 200 MG tablet, Take 1 tablet (200 mg total) by mouth daily., Disp: 90 tablet, Rfl: 1   methocarbamol (ROBAXIN) 500 MG tablet, Take 1 tablet (500 mg total) by mouth every 6 (six) hours as needed for muscle spasms., Disp: 80 tablet, Rfl: 2   metoprolol succinate (TOPROL-XL) 100 MG 24 hr tablet, take ONE tab daily, take additional 1/2 tab as needed FOR FOR BLOOD PRESSURE > 160/90, Disp: 135 tablet, Rfl: 1   Misc Natural Products (NEURIVA PO), Take by mouth., Disp: , Rfl:    Multiple Vitamin (MULTIVITAMIN WITH MINERALS) TABS tablet, Take 1 tablet by mouth daily., Disp: , Rfl:    nitroGLYCERIN (NITROSTAT) 0.4 MG SL tablet, Place 1 tablet (0.4 mg total) under the tongue every 5 (five) minutes as needed for chest pain (x 3 doses)., Disp: 25 tablet, Rfl: 11   oxyCODONE-acetaminophen (PERCOCET) 10-325 MG tablet, Take 1 tablet by mouth every 6 (six) hours as needed for pain. for pain, Disp: 120 tablet, Rfl: 0   pantoprazole (PROTONIX) 40 MG tablet, Take 1 tablet (40 mg total) by mouth 2 (two) times daily., Disp: 180 tablet, Rfl: 1   rOPINIRole (REQUIP) 0.5 MG tablet, TAKE ONE TABLET BY MOUTH AT BEDTIME, Disp: 90  tablet, Rfl: 1   rosuvastatin (CRESTOR) 20 MG tablet, Take 1 tablet (20 mg total) by mouth every evening., Disp: 90 tablet, Rfl: 1   solifenacin (VESICARE) 10 MG tablet, TAKE 1 TABLET ONCE A DAY., Disp: 90 tablet, Rfl: 0  traZODone (DESYREL) 50 MG tablet, take 2-4 tablets at bedtime as needed for insomnia., Disp: 120 tablet, Rfl: 5  EXAM:  VITALS per patient if applicable:     7/34/1937    1:16 PM 01/01/2023    2:07 PM 01/01/2023    1:00 PM  Vitals with BMI  Height '5\' 4"'$  '5\' 4"'$  '5\' 4"'$   Weight 139 lbs 6 oz 135 lbs 3 oz 135 lbs 3 oz  BMI 23.92 90.2 40.9  Systolic 735 80 80  Diastolic 73 45 45  Pulse 65 76 76     GENERAL: alert, oriented, appears well and in no acute distress  HEENT: atraumatic, conjunttiva clear, no obvious abnormalities on inspection of external nose and ears  NECK: normal movements of the head and neck  LUNGS: on inspection no signs of respiratory distress, breathing rate appears normal, no obvious gross SOB, gasping or wheezing  CV: no obvious cyanosis  MS: moves all visible extremities without noticeable abnormality  PSYCH/NEURO: pleasant and cooperative, no obvious depression or anxiety, speech and thought processing grossly intact  LABS:     Chemistry      Component Value Date/Time   NA 141 01/10/2023 1429   NA 141 06/05/2015 1315   K 3.9 01/10/2023 1429   K 3.9 06/05/2015 1315   CL 101 01/10/2023 1429   CL 103 06/05/2015 1315   CO2 32 01/10/2023 1429   CO2 28 06/05/2015 1315   BUN 14 01/10/2023 1429   BUN 14 06/05/2015 1315   CREATININE 0.76 01/10/2023 1429   CREATININE 0.87 02/03/2017 1523      Component Value Date/Time   CALCIUM 8.6 01/10/2023 1429   CALCIUM 9.0 06/05/2015 1315   ALKPHOS 65 03/26/2022 1015   ALKPHOS 84 06/05/2015 1315   AST 18 03/26/2022 1015   AST 23 06/05/2015 1315   ALT 9 03/26/2022 1015   ALT 17 06/05/2015 1315   BILITOT 0.3 03/26/2022 1015   BILITOT 1.00 06/05/2015 1315      ASSESSMENT AND  PLAN:  Discussed the following assessment and plan:  No diagnosis found.     I discussed the assessment and treatment plan with the patient. The patient was provided an opportunity to ask questions and all were answered. The patient agreed with the plan and demonstrated an understanding of the instructions.   F/u: ***  Signed:  Crissie Sickles, MD           01/17/2023

## 2023-01-20 ENCOUNTER — Telehealth: Payer: Medicare Other | Admitting: Family Medicine

## 2023-01-20 ENCOUNTER — Encounter: Payer: Self-pay | Admitting: Family Medicine

## 2023-01-20 VITALS — BP 168/106 | HR 93

## 2023-01-20 DIAGNOSIS — I10 Essential (primary) hypertension: Secondary | ICD-10-CM

## 2023-01-20 NOTE — Progress Notes (Unsigned)
HPI: FU diastolic congestive heart failure. Cardiac catheterization February 2014 showed nonobstructive disease (most significant 50-60 RCA) and normal LV function; note of 4+ MR. Renal Dopplers May 2014 showed no stenosis. Also with history of presumed orthostatic syncope. She has had problems with diastolic CHF. Nuclear study October 2018 showed ejection fraction 81%, probable shifting breast attenuation though very mild distal anteroseptal ischemia cannot be excluded. Treated medically. Carotid Dopplers October 2018 showed 1 to 39% bilateral stenosis.  Carotid Dopplers February 2023 showed 40 to 59% right and 1 to 39% left stenosis.  Echocardiogram February 2023 showed vigorous LV function, grade 1 diastolic dysfunction.  Since last seen,   Current Outpatient Medications  Medication Sig Dispense Refill   albuterol (VENTOLIN HFA) 108 (90 Base) MCG/ACT inhaler Inhale 1-2 puffs into the lungs every 6 (six) hours as needed for wheezing or shortness of breath. (Patient not taking: Reported on 01/20/2023) 18 g 5   ALPRAZolam (XANAX) 0.5 MG tablet TAKE ONE TABLET BY MOUTH THREE TIMES DAILY 90 tablet 5   amLODipine (NORVASC) 5 MG tablet Take 1 tablet (5 mg total) by mouth daily. 30 tablet 0   aspirin 81 MG tablet Take 81 mg by mouth at bedtime.     cyclobenzaprine (FLEXERIL) 5 MG tablet Take 5 mg by mouth 2 (two) times daily as needed.     diclofenac Sodium (VOLTAREN) 1 % GEL Apply 2 g topically 4 (four) times daily. 4 times a day to both knees 1000 each 4   donepezil (ARICEPT) 10 MG tablet Take 1 tablet (10 mg total) by mouth at bedtime. 90 tablet 1   DULoxetine (CYMBALTA) 60 MG capsule Take 1 capsule (60 mg total) by mouth daily. 1 tab p.o. daily 90 capsule 1   EPINEPHrine 0.3 mg/0.3 mL IJ SOAJ injection Inject 0.3 mg into the muscle as needed for anaphylaxis. (Patient not taking: Reported on 01/10/2023) 1 each 1   ipratropium (ATROVENT) 0.03 % nasal spray Place 2 sprays into both nostrils every 12  (twelve) hours as needed (allergies). 30 mL 5   isosorbide mononitrate (IMDUR) 30 MG 24 hr tablet TAKE 2 TABLETS IN THE AM AND 1 TABLET IN THE PM. 270 tablet 0   lamoTRIgine (LAMICTAL) 200 MG tablet Take 1 tablet (200 mg total) by mouth daily. 90 tablet 1   methocarbamol (ROBAXIN) 500 MG tablet Take 1 tablet (500 mg total) by mouth every 6 (six) hours as needed for muscle spasms. (Patient not taking: Reported on 01/20/2023) 80 tablet 2   metoprolol succinate (TOPROL-XL) 100 MG 24 hr tablet take ONE tab daily, take additional 1/2 tab as needed FOR FOR BLOOD PRESSURE > 160/90 135 tablet 1   Misc Natural Products (NEURIVA PO) Take by mouth.     Multiple Vitamin (MULTIVITAMIN WITH MINERALS) TABS tablet Take 1 tablet by mouth daily.     nitroGLYCERIN (NITROSTAT) 0.4 MG SL tablet Place 1 tablet (0.4 mg total) under the tongue every 5 (five) minutes as needed for chest pain (x 3 doses). 25 tablet 11   oxyCODONE-acetaminophen (PERCOCET) 10-325 MG tablet Take 1 tablet by mouth every 6 (six) hours as needed for pain. for pain 120 tablet 0   pantoprazole (PROTONIX) 40 MG tablet Take 1 tablet (40 mg total) by mouth 2 (two) times daily. 180 tablet 1   rOPINIRole (REQUIP) 0.5 MG tablet TAKE ONE TABLET BY MOUTH AT BEDTIME 90 tablet 1   rosuvastatin (CRESTOR) 20 MG tablet Take 1 tablet (20 mg total) by  mouth every evening. 90 tablet 1   solifenacin (VESICARE) 10 MG tablet TAKE 1 TABLET ONCE A DAY. 90 tablet 0   traZODone (DESYREL) 50 MG tablet take 2-4 tablets at bedtime as needed for insomnia. 120 tablet 5   No current facility-administered medications for this visit.     Past Medical History:  Diagnosis Date   Acute upper GI bleed 06/2014   while pt taking coumadin, plavix, and meloxicam---despite being told not to take coumadin.   Anginal pain ([place])    Nonobstructive CAD 2014; however, her cardiologist put her on a statin for this and NOT for hyperlipidemia per pt report.  Atyp CP 08/2017 at card f/u, plan  for myoc perf imaging.   Anxiety    panic attacks   Asthma    w/ asbestososis    BPPV (benign paroxysmal positional vertigo) 12/16/2012   Chronic diastolic CHF (congestive heart failure) (HCC)    dry wt as of 11/06/16 is 168 lbs.   Chronic lower back pain    COPD (chronic obstructive pulmonary disease) (HCC)    DDD (degenerative disc disease)    lumbar and cervical.    Diverticular disease    Fibromyalgia    Patient states dx was around her late 28s but she had sx's for years prior to this.   H/O hiatal hernia    History of pneumonia    hospitalized 12/2011, 02/2013, and 07/2013 [facility]) for this   HTN (hypertension)    Renal artery dopplers 04/2013 neg for stenosis.   Hypervitaminosis D 09/27/2019   over-supplemented.  Stopped vit D and plan recheck 2 mo.   Idiopathic angio-edema-urticaria 72014; 2021   Angioedema component was very minimal.  2021->Dr. Bobbitt (allergist) eval.   Insomnia    Iron deficiency anemia    Hematologist in [place], [place] did extensive w/u; no cause found; failed oral supplement;; gets fairly regular (q29mor so) IV iron infusions (Venofer -iron sucrose- '200mg'$  with procrit.  "for 14 yr I've been getting blood work q month & getting infusions prn" (07/12/2013).  Dr. EMarin Olplocally, iron infusions done, EPO deficiency dx'd   Kidney stone    Migraine syndrome    "not as often anymore; used to be ~ q wk" (07/12/2013)   Mixed incontinence urge and stress    Nephrolithiasis    "passed all on my own or they are still in there" (07/12/2013)   Neuroleptic induced parkinsonism (HCC) 2018   Dr. TCarles Collet neuro, saw her 11/24/17 and recommended d/c of abilify as first step.  D/c'd abilify and pt got complete recovery.   Oropharyngeal dysphagia    swallowing study speech path 05/2020. Gastric bx's showed gastritis, h pylori NEG   OSA on CPAP    prior to move to [place]--had another sleep study 10/2015 w/pulm Dr. ECamillo Flaming   Osteoarthritis    "severe; progressing fast" (07/12/2013);  multiple joints-not surgical candidate for TKR (03/2015).  Triamcinolon knee injections by Dr. STessa Lerner1/2019.   Pernicious anemia 08/24/2014   Pleural plaque with presence of asbestos 07/22/2013   [place]   [facility]: pt took coumadin for about 8-9 mo   Pyelonephritis    "several times over the last 30 yr" (07/12/2013)   RBBB (right bundle branch block)    Recurrent major depression ([place]    Recurrent UTI    hx of hospitalization for pyelonephritis; started abx prophylaxis 06/2015   Syncope    Hypotensive; ED  visit--Dr. Terrence Dupont did Cath--nonobstructive CAD, EF 55-60%.  In retrospect, suspect pt rx med misuse/polypharmacy   Vertebral compression fracture ([place])    Acute T12 on 02/27/22 (fall).  multiple old thoracic-->neurosurg to do MRI as of 05/2022    Past Surgical History:  Procedure Laterality Date   APPENDECTOMY  1960   AXILLARY SURGERY Left 1978   Multiple "lump" in armpit per pt   BIOPSY  06/17/2020   Gastric bx->gastritis, h pylori neg.  Procedure: BIOPSY;  Surgeon: Carol Ada, MD;  Location: [facility];  Service: Endoscopy;;   CARDIAC CATHETERIZATION  01/2013   nonobstructive CAD, EF 55-60%   CARDIOVASCULAR STRESS TEST  02/22/2015   Low risk myocard perf imaging; wall motion normal, normal EF   CARDIOVASCULAR STRESS TEST     09/2017 myo perf low risk   carotid duplex doppler  10/21/2017   R vertebral flow suggestive of possible distal obstruction.  Pt declined further w/u as of 10/29/17 but need to revisit this problem periodically.   COCCYX REMOVAL  1972   DEXA  06/05/2017   T-score -3.1   DILATION AND CURETTAGE OF UTERUS  ? 1970's   ESOPHAGOGASTRODUODENOSCOPY N/A 07/19/2014   Gastritis found + in the setting of supratherapeutic INR, +plavix, + meloxicam.   ESOPHAGOGASTRODUODENOSCOPY (EGD) WITH PROPOFOL N/A 06/17/2020   NO stricture or other prob to explain pt's dysphagia, dilation was done  anyway.  Gastric bx's-->gastritis, h pylori neg. Procedure: ESOPHAGOGASTRODUODENOSCOPY (EGD) WITH PROPOFOL;  Surgeon: Carol Ada, MD;  Location: [facility];  Service: Endoscopy;  Laterality: N/A;   EYE SURGERY Left 2012-2013   "injections for ~ 1 yr; don't really know what for" (07/12/2013)   HEEL SPUR SURGERY Left 2008   kidney stone removal Right    KNEE SURGERY  2005   LEFT HEART CATHETERIZATION WITH CORONARY ANGIOGRAM N/A 01/30/2013   Procedure: LEFT HEART CATHETERIZATION WITH CORONARY ANGIOGRAM;  Surgeon: Clent Demark, MD;  Location: [facility];  Service: Cardiovascular;  Laterality: N/A;   MALONEY DILATION  06/17/2020   Procedure: Venia Minks DILATION;  Surgeon: Carol Ada, MD;  Location: [facility];  Service: Endoscopy;;   PLANTAR FASCIA RELEASE Left 2008   SPIROMETRY  04/25/2014   In hosp for acute asthma/COPD flare: mixed obstructive and restrictive lung disease. The FEV1 is severely reduced at 45% predicted.  FEV1 signif decreased compared to prior spirometry 07/23/13.   TENDON RELEASE  1996   Right forearm and hand   TOTAL ABDOMINAL HYSTERECTOMY  1974   TRANSTHORACIC ECHOCARDIOGRAM  01/2013; 04/2014;08/2015; 09/2017   2014--NORMAL.  2015--focal basal septal hypertrophy, EF 55-60%, grade I diast dysfxn, mild LAE.  08/2015 EF 55-60%, nl LV syst fxn, grade I DD, valves wnl. 10/21/17: EF 65-70%, grd I DD, o/w normal. 02/17/22 EF 70-75%, hyperdynamic LV fxn, grd I DD.    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  Relocated to [place] 09/2012 to be closer to her son who has MS.   Husband d 2015--mesothelioma.   Occupation: former Pharmacist, hospital.   Education: masters degree level.   No T/A/Ds.   Lives in [place], independent living.   Social Determinants of Health   Financial Resource Strain: Low Risk  (01/01/2023)   Overall Financial Resource Strain (CARDIA)    Difficulty of Paying Living Expenses: Not hard at all  Food Insecurity: No Food Insecurity (01/01/2023)   Hunger Vital Sign    Worried About Running Out of Food in the Last Year: Never true    Ran Out of Food in the Last Year: Never true  Transportation Needs: No Transportation Needs (01/01/2023)   PRAPARE - Hydrologist (Medical): No    Lack of Transportation (Non-Medical): No  Physical Activity: Insufficiently Active (01/01/2023)   Exercise Vital Sign    Days of Exercise per Week: 2 days    Minutes of Exercise per Session: 20 min  Stress: No Stress Concern Present (01/01/2023)   [place]    Feeling of Stress : Not at all  Social Connections: Moderately Isolated (01/01/2023)   Social Connection and Isolation Panel [NHANES]    Frequency of Communication with Friends and Family: Three times a week    Frequency of Social Gatherings with Friends and Family: More than three times a week    Attends Religious Services: More than 4 times per year    Active Member of Clubs or Organizations: No    Attends Archivist Meetings: Never    Marital Status: Widowed  Intimate Partner Violence: Not At Risk (01/01/2023)   Humiliation, Afraid, Rape, and Kick questionnaire    Fear of Current or Ex-Partner: No    Emotionally Abused: No    Physically Abused: No    Sexually Abused: No    Family History  Problem Relation Age  of Onset   Arthritis Mother    Kidney disease Mother    Heart disease Father    Stroke Father    Hypertension Father    Diabetes Father    Heart attack Father    Heart attack Paternal Grandmother    Diabetes Sister        one sister   Hypertension Sister    Asthma Sister    Hypertension Brother    Asthma Brother    Asthma Daughter    Multiple sclerosis Son     ROS: no fevers or chills, productive cough, hemoptysis, dysphasia, odynophagia, melena, hematochezia, dysuria, hematuria, rash, seizure activity, orthopnea, PND, pedal edema, claudication. Remaining systems are negative.  Physical Exam: Well-developed well-nourished in no acute distress.  Skin is warm and dry.  HEENT is normal.  Neck is supple.  Chest is clear to auscultation with normal expansion.  Cardiovascular exam is regular rate and rhythm.  Abdominal exam nontender or distended. No masses palpated. Extremities show no edema. neuro grossly intact  ECG- personally reviewed  A/P  1 chronic diastolic congestive heart failure-she is euvolemic on examination.  Will continue diuretic as needed.  2 chronic chest pain-  3 coronary artery disease-continue aspirin and statin.  4 hypertension-patient's blood pressure is controlled.  Continue present medical regimen.  5 hyperlipidemia-continue statin.  [name], MD

## 2023-01-20 NOTE — Progress Notes (Signed)
Tried to call patient but had to leave voicemail x 3 attempts. Encounter not done. Signed:  Crissie Sickles, MD           01/20/2023

## 2023-01-22 ENCOUNTER — Other Ambulatory Visit: Payer: Self-pay | Admitting: Cardiology

## 2023-01-22 ENCOUNTER — Encounter: Payer: Self-pay | Admitting: Cardiology

## 2023-01-22 ENCOUNTER — Ambulatory Visit: Payer: Medicare Other | Attending: Cardiology | Admitting: Cardiology

## 2023-01-22 VITALS — BP 120/60 | HR 69 | Ht 64.0 in | Wt 138.6 lb

## 2023-01-22 DIAGNOSIS — I5032 Chronic diastolic (congestive) heart failure: Secondary | ICD-10-CM | POA: Insufficient documentation

## 2023-01-22 DIAGNOSIS — I10 Essential (primary) hypertension: Secondary | ICD-10-CM | POA: Insufficient documentation

## 2023-01-22 DIAGNOSIS — I2510 Atherosclerotic heart disease of native coronary artery without angina pectoris: Secondary | ICD-10-CM

## 2023-01-22 DIAGNOSIS — I6523 Occlusion and stenosis of bilateral carotid arteries: Secondary | ICD-10-CM | POA: Insufficient documentation

## 2023-01-22 DIAGNOSIS — E7800 Pure hypercholesterolemia, unspecified: Secondary | ICD-10-CM | POA: Diagnosis not present

## 2023-01-22 DIAGNOSIS — R072 Precordial pain: Secondary | ICD-10-CM

## 2023-01-22 MED ORDER — METOPROLOL TARTRATE 50 MG PO TABS: ORAL_TABLET | ORAL | 0 refills | Status: DC

## 2023-01-22 NOTE — Patient Instructions (Signed)
Testing/Procedures:  Your physician has requested that you have a carotid duplex. This test is an ultrasound of the carotid arteries in your neck. It looks at blood flow through these arteries that supply the brain with blood. Allow one hour for this exam. There are no restrictions or special instructions. [place] OFFICE     Your cardiac CT will be scheduled at   [facility] [place], [place] [address] [phone]    If scheduled at [facility], please arrive at the [facility] Entrance (Entrance C2) of [facility] 30 minutes prior to test start time. You can use the FREE valet parking offered at entrance C (encouraged to control the heart rate for the test)  Proceed to the [facility] Radiology Department (first floor) to check-in and test prep.  All radiology patients and guests should use entrance C2 at [facility], accessed from [facility], even though the hospital's physical address listed is [address]       Please follow these instructions carefully (unless otherwise directed):    On the Night Before the Test: Be sure to Drink plenty of water. Do not consume any caffeinated/decaffeinated beverages or chocolate 12 hours prior to your test. Do not take any antihistamines 12 hours prior to your test.   On the Day of the Test: Drink plenty of water until 1 hour prior to the test. Do not eat any food 1 hour prior to test. You may take your regular medications prior to the test.  Take metoprolol (Lopressor) 50 mg two hours prior to test. FEMALES- please wear underwire-free bra if available, avoid dresses & tight clothing       After the Test: Drink plenty of water. After receiving IV contrast, you may experience a mild flushed feeling. This is normal. On occasion, you may experience a mild rash up to 24 hours after the test. This is not dangerous. If this occurs, you can take  Benadryl 25 mg and increase your fluid intake. If you experience trouble breathing, this can be serious. If it is severe call 911 IMMEDIATELY. If it is mild, please call our office.  We will call to schedule your test 2-4 weeks out understanding that some insurance companies will need an authorization prior to the service being performed.   For non-scheduling related questions, please contact the cardiac imaging nurse navigator should you have any questions/concerns: Marchia Bond, Cardiac Imaging Nurse Navigator Gordy Clement, Cardiac Imaging Nurse Navigator [facility] Direct Office Dial: [phone]   For scheduling needs, including cancellations and rescheduling, please call [place], [phone].    Follow-Up: At [facility], you and your health needs are our priority.  As part of our continuing mission to provide you with exceptional heart care, we have created designated Provider Care Teams.  These Care Teams include your primary Cardiologist (physician) and Advanced Practice Providers (APPs -  Physician Assistants and Nurse Practitioners) who all work together to provide you with the care you need, when you need it.  We recommend signing up for the patient portal called "MyChart".  Sign up information is provided on this After Visit Summary.  MyChart is used to connect with patients for Virtual Visits (Telemedicine).  Patients are able to view lab/test results, encounter notes, upcoming appointments, etc.  Non-urgent messages can be sent to your provider as well.   To learn more about what you can do with MyChart, go  to [email or web address].    Your next appointment:   3 month(s)  Provider:    ANY APP   Then, [name] MD will plan to see you again in 12 month(s).

## 2023-01-24 ENCOUNTER — Other Ambulatory Visit: Payer: Self-pay

## 2023-01-24 DIAGNOSIS — M1711 Unilateral primary osteoarthritis, right knee: Secondary | ICD-10-CM

## 2023-01-24 DIAGNOSIS — M1712 Unilateral primary osteoarthritis, left knee: Secondary | ICD-10-CM

## 2023-01-24 MED ORDER — OXYCODONE-ACETAMINOPHEN 10-325 MG PO TABS: 1.0000 | ORAL_TABLET | Freq: Four times a day (QID) | ORAL | 0 refills | Status: DC | PRN

## 2023-01-24 NOTE — Telephone Encounter (Signed)
[facility] on [address] has the Oxycodone. Please send Rx to [facility]. [facility] has been cancelled.

## 2023-01-24 NOTE — Telephone Encounter (Signed)
Medication list was reviewed. Oxycodone prescription was already sent on 12/26/2022.  [place]  They are out of stock of Oxycodone 10/325, they have Oxycodone 7.5/325.  Ms. Groesbeck was called, she will try to call another pharmacy and call office with her decision.  She already has refills on her Cyclobenzaprine, she verbalizes understanding.

## 2023-01-24 NOTE — Telephone Encounter (Signed)
Linda [name] stated she has 4-5 days of Oxycodone on hand. And she need the Flexeril refilled also.   Please send to [facility].

## 2023-01-24 NOTE — Telephone Encounter (Signed)
PMP was Reviewed.  Oxycodone e-scribed to pharmacy.  Call placed to Ms. Bresee, no answer.  Left Ms Voisin a message.

## 2023-01-24 NOTE — Addendum Note (Signed)
Addended by: Franchot Gallo on: 01/24/2023 12:59 PM   Modules accepted: Orders

## 2023-01-24 NOTE — Addendum Note (Signed)
Addended by: Bayard Hugger on: 01/24/2023 02:22 PM   Modules accepted: Orders

## 2023-01-29 ENCOUNTER — Encounter: Payer: Medicare Other | Admitting: Physical Medicine & Rehabilitation

## 2023-01-30 ENCOUNTER — Other Ambulatory Visit: Payer: Self-pay | Admitting: Family Medicine

## 2023-01-30 NOTE — Addendum Note (Signed)
Addended by: Kavin Leech on: 01/30/2023 02:16 PM   Modules accepted: Orders, Level of Service

## 2023-02-04 DIAGNOSIS — M6281 Muscle weakness (generalized): Secondary | ICD-10-CM | POA: Diagnosis not present

## 2023-02-06 ENCOUNTER — Ambulatory Visit (HOSPITAL_COMMUNITY)
Admission: RE | Admit: 2023-02-06 | Discharge: 2023-02-06 | Disposition: A | Discharge status: Home / Self Care | Payer: Medicare Other | Source: Ambulatory Visit | Attending: Cardiology | Admitting: Cardiology

## 2023-02-06 DIAGNOSIS — I6523 Occlusion and stenosis of bilateral carotid arteries: Secondary | ICD-10-CM | POA: Diagnosis not present

## 2023-02-07 ENCOUNTER — Encounter: Payer: Self-pay | Admitting: *Deleted

## 2023-02-07 DIAGNOSIS — M6281 Muscle weakness (generalized): Secondary | ICD-10-CM | POA: Diagnosis not present

## 2023-02-10 DIAGNOSIS — H04123 Dry eye syndrome of bilateral lacrimal glands: Secondary | ICD-10-CM | POA: Diagnosis not present

## 2023-02-10 DIAGNOSIS — H53133 Sudden visual loss, bilateral: Secondary | ICD-10-CM | POA: Diagnosis not present

## 2023-02-10 DIAGNOSIS — S069X1D Unspecified intracranial injury with loss of consciousness of 30 minutes or less, subsequent encounter: Secondary | ICD-10-CM | POA: Diagnosis not present

## 2023-02-10 DIAGNOSIS — H469 Unspecified optic neuritis: Secondary | ICD-10-CM | POA: Diagnosis not present

## 2023-02-10 DIAGNOSIS — Z8673 Personal history of transient ischemic attack (TIA), and cerebral infarction without residual deficits: Secondary | ICD-10-CM | POA: Diagnosis not present

## 2023-02-11 DIAGNOSIS — M6281 Muscle weakness (generalized): Secondary | ICD-10-CM | POA: Diagnosis not present

## 2023-02-14 ENCOUNTER — Telehealth: Payer: Self-pay | Admitting: Registered Nurse

## 2023-02-14 DIAGNOSIS — M1711 Unilateral primary osteoarthritis, right knee: Secondary | ICD-10-CM

## 2023-02-14 DIAGNOSIS — M1712 Unilateral primary osteoarthritis, left knee: Secondary | ICD-10-CM

## 2023-02-14 MED ORDER — OXYCODONE-ACETAMINOPHEN 10-325 MG PO TABS: 1.0000 | ORAL_TABLET | Freq: Four times a day (QID) | ORAL | 0 refills | Status: DC | PRN

## 2023-02-14 NOTE — Telephone Encounter (Signed)
It is due at end of month. Rx is dated as such

## 2023-02-14 NOTE — Telephone Encounter (Signed)
Patient has called to verify they have the medication. She needs refill on Oxycodone 10-325.  Please send to  [place]

## 2023-02-21 ENCOUNTER — Telehealth: Payer: Self-pay

## 2023-02-21 DIAGNOSIS — M1712 Unilateral primary osteoarthritis, left knee: Secondary | ICD-10-CM

## 2023-02-21 DIAGNOSIS — M1711 Unilateral primary osteoarthritis, right knee: Secondary | ICD-10-CM

## 2023-02-21 MED ORDER — OXYCODONE-ACETAMINOPHEN 10-325 MG PO TABS: 1.0000 | ORAL_TABLET | Freq: Four times a day (QID) | ORAL | 0 refills | Status: DC | PRN

## 2023-02-21 NOTE — Telephone Encounter (Signed)
[facility] did not have oxycodone  and patient would like it sent to [facility] [address], I have updated the pharmacy in the system for you

## 2023-02-21 NOTE — Telephone Encounter (Signed)
PMP was Reviewed.  Oxycodone e-scribed today.

## 2023-02-23 ENCOUNTER — Emergency Department (HOSPITAL_COMMUNITY): Payer: Medicare Other

## 2023-02-23 ENCOUNTER — Emergency Department (HOSPITAL_COMMUNITY)
Admission: EM | Admit: 2023-02-23 | Discharge: 2023-02-23 | Disposition: A | Discharge status: Home / Self Care | Payer: Medicare Other | Attending: Emergency Medicine | Admitting: Emergency Medicine

## 2023-02-23 DIAGNOSIS — I509 Heart failure, unspecified: Secondary | ICD-10-CM | POA: Diagnosis not present

## 2023-02-23 DIAGNOSIS — R4182 Altered mental status, unspecified: Secondary | ICD-10-CM | POA: Diagnosis not present

## 2023-02-23 DIAGNOSIS — Z86711 Personal history of pulmonary embolism: Secondary | ICD-10-CM | POA: Diagnosis not present

## 2023-02-23 DIAGNOSIS — Z79899 Other long term (current) drug therapy: Secondary | ICD-10-CM | POA: Insufficient documentation

## 2023-02-23 DIAGNOSIS — I110 Hypertensive heart disease with heart failure: Secondary | ICD-10-CM | POA: Insufficient documentation

## 2023-02-23 DIAGNOSIS — R0902 Hypoxemia: Secondary | ICD-10-CM | POA: Insufficient documentation

## 2023-02-23 DIAGNOSIS — J449 Chronic obstructive pulmonary disease, unspecified: Secondary | ICD-10-CM | POA: Insufficient documentation

## 2023-02-23 DIAGNOSIS — R531 Weakness: Secondary | ICD-10-CM | POA: Diagnosis not present

## 2023-02-23 DIAGNOSIS — R410 Disorientation, unspecified: Secondary | ICD-10-CM | POA: Diagnosis not present

## 2023-02-23 DIAGNOSIS — R0789 Other chest pain: Secondary | ICD-10-CM | POA: Diagnosis not present

## 2023-02-23 DIAGNOSIS — Z1152 Encounter for screening for COVID-19: Secondary | ICD-10-CM | POA: Diagnosis not present

## 2023-02-23 DIAGNOSIS — Z7982 Long term (current) use of aspirin: Secondary | ICD-10-CM | POA: Insufficient documentation

## 2023-02-23 DIAGNOSIS — N3000 Acute cystitis without hematuria: Secondary | ICD-10-CM

## 2023-02-23 DIAGNOSIS — Z7401 Bed confinement status: Secondary | ICD-10-CM | POA: Diagnosis not present

## 2023-02-23 DIAGNOSIS — R079 Chest pain, unspecified: Secondary | ICD-10-CM | POA: Diagnosis not present

## 2023-02-23 LAB — RESP PANEL BY RT-PCR (RSV, FLU A&B, COVID)  RVPGX2
Influenza A by PCR: NEGATIVE
Influenza B by PCR: NEGATIVE
Resp Syncytial Virus by PCR: NEGATIVE
SARS Coronavirus 2 by RT PCR: NEGATIVE

## 2023-02-23 LAB — URINALYSIS, ROUTINE W REFLEX MICROSCOPIC
Bilirubin Urine: NEGATIVE
Glucose, UA: NEGATIVE mg/dL
Hgb urine dipstick: NEGATIVE
Ketones, ur: NEGATIVE mg/dL
Nitrite: POSITIVE — AB
Protein, ur: NEGATIVE mg/dL
Specific Gravity, Urine: 1.012 (ref 1.005–1.030)
pH: 5 (ref 5.0–8.0)

## 2023-02-23 LAB — COMPREHENSIVE METABOLIC PANEL
ALT: 8 U/L (ref 0–44)
AST: 19 U/L (ref 15–41)
Albumin: 3.2 g/dL — ABNORMAL LOW (ref 3.5–5.0)
Alkaline Phosphatase: 58 U/L (ref 38–126)
Anion gap: 12 (ref 5–15)
BUN: 10 mg/dL (ref 8–23)
CO2: 24 mmol/L (ref 22–32)
Calcium: 8.4 mg/dL — ABNORMAL LOW (ref 8.9–10.3)
Chloride: 100 mmol/L (ref 98–111)
Creatinine, Ser: 0.78 mg/dL (ref 0.44–1.00)
GFR, Estimated: >60 mL/min (ref >60)
Glucose, Bld: 106 mg/dL — ABNORMAL HIGH (ref 70–99)
Potassium: 3.8 mmol/L (ref 3.5–5.1)
Sodium: 136 mmol/L (ref 135–145)
Total Bilirubin: 1.4 mg/dL — ABNORMAL HIGH (ref 0.3–1.2)
Total Protein: 5.8 g/dL — ABNORMAL LOW (ref 6.5–8.1)

## 2023-02-23 LAB — CBC WITH DIFFERENTIAL/PLATELET
Abs Immature Granulocytes: 0.02 10*3/uL (ref 0.00–0.07)
Basophils Absolute: 0 10*3/uL (ref 0.0–0.1)
Basophils Relative: 1 %
Eosinophils Absolute: 0.2 10*3/uL (ref 0.0–0.5)
Eosinophils Relative: 4 %
HCT: 34.6 % — ABNORMAL LOW (ref 36.0–46.0)
Hemoglobin: 11.6 g/dL — ABNORMAL LOW (ref 12.0–15.0)
Immature Granulocytes: 0 %
Lymphocytes Relative: 20 %
Lymphs Abs: 1.2 10*3/uL (ref 0.7–4.0)
MCH: 29.1 pg (ref 26.0–34.0)
MCHC: 33.5 g/dL (ref 30.0–36.0)
MCV: 86.7 fL (ref 80.0–100.0)
Monocytes Absolute: 0.4 10*3/uL (ref 0.1–1.0)
Monocytes Relative: 7 %
Neutro Abs: 4.3 10*3/uL (ref 1.7–7.7)
Neutrophils Relative %: 68 %
Platelets: 147 10*3/uL — ABNORMAL LOW (ref 150–400)
RBC: 3.99 MIL/uL (ref 3.87–5.11)
RDW: 12.9 % (ref 11.5–15.5)
WBC: 6.2 10*3/uL (ref 4.0–10.5)
nRBC: 0 % (ref 0.0–0.2)

## 2023-02-23 LAB — CBG MONITORING, ED: Glucose-Capillary: 98 mg/dL (ref 70–99)

## 2023-02-23 LAB — BLOOD GAS, VENOUS
Acid-Base Excess: 7.2 mmol/L — ABNORMAL HIGH (ref 0.0–2.0)
Bicarbonate: 32.6 mmol/L — ABNORMAL HIGH (ref 20.0–28.0)
O2 Saturation: 71.8 %
Patient temperature: 36.6
pCO2, Ven: 47 mmHg (ref 44–60)
pH, Ven: 7.45 — ABNORMAL HIGH (ref 7.25–7.43)
pO2, Ven: 40 mmHg (ref 32–45)

## 2023-02-23 LAB — TROPONIN I (HIGH SENSITIVITY)
Troponin I (High Sensitivity): 3 ng/L (ref <18)
Troponin I (High Sensitivity): 3 ng/L (ref <18)

## 2023-02-23 MED ORDER — SODIUM CHLORIDE 0.9 % IV SOLN
1.0000 g | Freq: Once | INTRAVENOUS | Status: AC
  Administered 2023-02-23: 1 g via INTRAVENOUS
  Filled 2023-02-23: qty 10

## 2023-02-23 MED ORDER — CEFDINIR 300 MG PO CAPS
300.0000 mg | ORAL_CAPSULE | Freq: Two times a day (BID) | ORAL | Status: DC
  Filled 2023-02-23: qty 1

## 2023-02-23 MED ORDER — CEFDINIR 300 MG PO CAPS: 300.0000 mg | ORAL_CAPSULE | Freq: Two times a day (BID) | ORAL | 0 refills | Status: DC

## 2023-02-23 NOTE — ED Notes (Signed)
Spoke with Colletta Maryland at [facility] to inform them that pt is being discharged.

## 2023-02-23 NOTE — ED Notes (Signed)
[facility] called 

## 2023-02-23 NOTE — Discharge Instructions (Addendum)
You were seen in the ER today for evaluation of initial altered mental status.  You been alert and oriented x 4 here.  You likely having some momentary confusion at your ALF due to your urinary tract infection that you have.  We gave you your first dose of antibiotics tonight.  I have sent in the rest of your prescription [place] [facility].  Please take as directed.  Follow-up with your primary care doctor for resolution of symptoms.  If you have any concerns, new or worsening symptoms, please return to the nearest for department for evaluation.  Contact a health care provider if: Your symptoms do not get better after 1-2 days. Your symptoms go away and then return. Get help right away if: You have severe pain in your back or your lower abdomen. You have a fever or chills. You have nausea or vomiting.

## 2023-02-23 NOTE — ED Triage Notes (Signed)
Patient BIB EMS from [facility] for '@1700'$  feeling exhausted, AMS, and lethargic. SPO2 88% placed on 3liters 98%. Not normally on home O2. CAP 25-30. ASA '324mg'$  20gLFA, 40m IVF

## 2023-02-23 NOTE — ED Provider Notes (Signed)
[facility] Provider Note   CSN: [id number] Arrival date & time: 02/23/23  1750     History {Add pertinent medical, surgical, social history, OB history to HPI:1} No chief complaint on file.   Linda [name] is a 77 y.o. female.  Patient with history of hypertension, fibromyalgia, osteoarthritis, PE, CHF, COPD, pulmonary asbestosis presents today from [facility] independent living with concern for altered mental status.  According to nursing staff who I spoke with on the phone, staff went to get her for dinner this evening and she was acting more sleepy than normal. Staff had last seen her the previous night and she was acting normally at that time.  She was able to ambulate with a walker which is her baseline, however given her decreased alertness EMS was called.  According to EMS, patient was found to be satting 88% on room air and was placed on 2 L with improvement.  Patient is not normally on oxygen.  She denies any chest pain or shortness of breath.  On my evaluation, she is satting 100% on room air.  Patient is currently alert and oriented and is without complaints.  Specifically, she denies fevers, chills, headache, vision changes, nausea, vomiting, diarrhea, abdominal pain, hematuria, or dysuria.  Of note, patient is prescribed Percocet for her osteoarthritis and she denies taking any additional doses of this.  The history is provided by the patient. No language interpreter was used.       Home Medications Prior to Admission medications   Medication Sig Start Date End Date Taking? Authorizing Provider  albuterol (VENTOLIN HFA) 108 (90 Base) MCG/ACT inhaler Inhale 1-2 puffs into the lungs every 6 (six) hours as needed for wheezing or shortness of breath. 01/01/23   McGowen, Adrian Blackwater, MD  ALPRAZolam Duanne Moron) 0.5 MG tablet TAKE ONE TABLET BY MOUTH THREE TIMES DAILY 08/12/22   McGowen, Adrian Blackwater, MD  amLODipine (NORVASC) 5 MG tablet Take 1  tablet (5 mg total) by mouth daily. 01/10/23   McGowen, Adrian Blackwater, MD  aspirin 81 MG tablet Take 81 mg by mouth at bedtime. Patient not taking: Reported on 01/22/2023    [provider]  cyclobenzaprine (FLEXERIL) 5 MG tablet Take 5 mg by mouth 2 (two) times daily as needed. 11/22/22   [provider]  diclofenac Sodium (VOLTAREN) 1 % GEL Apply 2 g topically 4 (four) times daily. 4 times a day to both knees 08/07/22   Meredith Staggers, MD  donepezil (ARICEPT) 10 MG tablet Take 1 tablet (10 mg total) by mouth at bedtime. Patient not taking: Reported on 01/22/2023 01/01/23   Tammi Sou, MD  DULoxetine (CYMBALTA) 60 MG capsule Take 1 capsule (60 mg total) by mouth daily. 1 tab p.o. daily 01/01/23   McGowen, Adrian Blackwater, MD  EPINEPHrine 0.3 mg/0.3 mL IJ SOAJ injection Inject 0.3 mg into the muscle as needed for anaphylaxis. 12/28/20   McGowen, Adrian Blackwater, MD  ipratropium (ATROVENT) 0.03 % nasal spray Place 2 sprays into both nostrils every 12 (twelve) hours as needed (allergies). 01/01/23   McGowen, Adrian Blackwater, MD  isosorbide mononitrate (IMDUR) 30 MG 24 hr tablet TAKE 2 TABLETS IN THE morning AND 1 TABLET IN THE evening. Need office visit for future refills. 01/22/23   Lelon Perla, MD  lamoTRIgine (LAMICTAL) 200 MG tablet Take 1 tablet (200 mg total) by mouth daily. 01/01/23   McGowen, Adrian Blackwater, MD  methocarbamol (ROBAXIN) 500 MG tablet Take 1  tablet (500 mg total) by mouth every 6 (six) hours as needed for muscle spasms. 11/13/22   Meredith Staggers, MD  metoprolol succinate (TOPROL-XL) 100 MG 24 hr tablet take ONE tab daily, take additional 1/2 tab as needed FOR FOR BLOOD PRESSURE > 160/90 01/01/23   McGowen, Adrian Blackwater, MD  metoprolol tartrate (LOPRESSOR) 50 MG tablet Take 2 hours prior to CT scan 01/22/23   Lelon Perla, MD  Misc Natural Products (NEURIVA PO) Take by mouth.    [provider]  Multiple Vitamin (MULTIVITAMIN WITH MINERALS) TABS tablet Take 1 tablet by mouth  daily.    [provider]  nitroGLYCERIN (NITROSTAT) 0.4 MG SL tablet Place 1 tablet (0.4 mg total) under the tongue every 5 (five) minutes as needed for chest pain (x 3 doses). 01/08/23   Lelon Perla, MD  oxyCODONE-acetaminophen (PERCOCET) 10-325 MG tablet Take 1 tablet by mouth every 6 (six) hours as needed for pain. for pain 02/21/23   Bayard Hugger, NP  pantoprazole (PROTONIX) 40 MG tablet Take 1 tablet (40 mg total) by mouth 2 (two) times daily. 01/01/23   McGowen, Adrian Blackwater, MD  rOPINIRole (REQUIP) 0.5 MG tablet TAKE ONE TABLET BY MOUTH AT BEDTIME 01/03/23   McGowen, Adrian Blackwater, MD  rosuvastatin (CRESTOR) 20 MG tablet Take 1 tablet (20 mg total) by mouth every evening. 01/01/23   McGowen, Adrian Blackwater, MD  solifenacin (VESICARE) 10 MG tablet TAKE 1 TABLET ONCE A DAY. 12/06/22   McGowen, Adrian Blackwater, MD  traZODone (DESYREL) 50 MG tablet take 2-4 tablets at bedtime as needed for insomnia. 12/09/22   McGowen, Adrian Blackwater, MD      Allergies    Aripiprazole, Penicillins, and Bactrim [sulfamethoxazole-trimethoprim]    Review of Systems   Review of Systems  All other systems reviewed and are negative.   Physical Exam Updated Vital Signs BP 126/64   Pulse 73   Temp 97.9 F (36.6 C)   Resp 14   SpO2 99%  Physical Exam Vitals and nursing note reviewed.  Constitutional:      General: She is not in acute distress.    Appearance: Normal appearance. She is normal weight. She is not ill-appearing, toxic-appearing or diaphoretic.  HENT:     Head: Normocephalic and atraumatic.  Eyes:     Extraocular Movements: Extraocular movements intact.     Pupils: Pupils are equal, round, and reactive to light.  Cardiovascular:     Rate and Rhythm: Normal rate and regular rhythm.     Heart sounds: Normal heart sounds.  Pulmonary:     Effort: Pulmonary effort is normal. No respiratory distress.     Breath sounds: Normal breath sounds.     Comments: On room air satting 100% Abdominal:     General:  Abdomen is flat.     Palpations: Abdomen is soft.     Tenderness: There is no abdominal tenderness.  Musculoskeletal:        General: Normal range of motion.     Cervical back: Normal range of motion.  Skin:    General: Skin is warm and dry.  Neurological:     General: No focal deficit present.     Mental Status: She is alert and oriented to person, place, and time.     GCS: GCS eye subscore is 4. GCS verbal subscore is 5. GCS motor subscore is 6.     Sensory: Sensation is intact.     Motor: Motor function is intact.  Coordination: Coordination is intact.     Gait: Gait is intact.     Comments: Alert and oriented to self, place, time and event.    Speech is fluent, clear without dysarthria or dysphasia.    Strength 5/5 in upper/lower extremities   Sensation intact in upper/lower extremities    CN I not tested  CN II grossly intact visual fields bilaterally. Did not visualize posterior eye.  CN III, IV, VI PERRLA and EOMs intact bilaterally  CN V Intact sensation to sharp and light touch to the face  CN VII facial movements symmetric  CN VIII not tested  CN IX, X no uvula deviation, symmetric rise of soft palate  CN XI 5/5 SCM and trapezius strength bilaterally  CN XII Midline tongue protrusion, symmetric L/R movements   Psychiatric:        Mood and Affect: Mood normal.        Behavior: Behavior normal.     ED Results / Procedures / Treatments   Labs (all labs ordered are listed, but only abnormal results are displayed) Labs Reviewed  COMPREHENSIVE METABOLIC PANEL - Abnormal; Notable for the following components:      Result Value   Glucose, Bld 106 (*)    Calcium 8.4 (*)    Total Protein 5.8 (*)    Albumin 3.2 (*)    Total Bilirubin 1.4 (*)    All other components within normal limits  CBC WITH DIFFERENTIAL/PLATELET - Abnormal; Notable for the following components:   Hemoglobin 11.6 (*)    HCT 34.6 (*)    Platelets 147 (*)    All other components within normal  limits  RESP PANEL BY RT-PCR (RSV, FLU A&B, COVID)  [id number]  URINALYSIS, ROUTINE W REFLEX MICROSCOPIC  BLOOD GAS, VENOUS  CBG MONITORING, ED  TROPONIN I (HIGH SENSITIVITY)  TROPONIN I (HIGH SENSITIVITY)    EKG None  Radiology CT Head Wo Contrast  Result Date: 02/23/2023 CLINICAL DATA:  Mental status change, unknown cause EXAM: CT HEAD WITHOUT CONTRAST TECHNIQUE: Contiguous axial images were obtained from the base of the skull through the vertex without intravenous contrast. RADIATION DOSE REDUCTION: This exam was performed according to the departmental dose-optimization program which includes automated exposure control, adjustment of the mA and/or kV according to patient size and/or use of iterative reconstruction technique. COMPARISON:  03/26/2022 FINDINGS: Brain: Mild age related volume loss. No acute intracranial abnormality. Specifically, no hemorrhage, hydrocephalus, mass lesion, acute infarction, or significant intracranial injury. Vascular: No hyperdense vessel or unexpected calcification. Skull: No acute calvarial abnormality. Sinuses/Orbits: No acute findings Other: None IMPRESSION: No acute intracranial abnormality. Electronically Signed   By: Rolm Baptise M.D.   On: 02/23/2023 19:37   DG Chest Port 1 View  Result Date: 02/23/2023 CLINICAL DATA:  Altered mental status. EXAM: PORTABLE CHEST 1 VIEW COMPARISON:  Chest radiograph dated 03/26/2022. FINDINGS: Stable bilateral calcified pleural plaques. No focal consolidation, pleural effusion, pneumothorax. The cardiac silhouette is within limits. Atherosclerotic calcification of the aorta. No acute osseous pathology. IMPRESSION: 1. No active disease. 2. Stable bilateral pleural plaques. Electronically Signed   By: Anner Crete M.D.   On: 02/23/2023 18:32    Procedures Procedures  {Document cardiac monitor, telemetry assessment procedure when appropriate:1}  Medications Ordered in ED Medications - No data to display  ED Course/  Medical Decision Making/ A&P   {   Click here for ABCD2, HEART and other calculatorsREFRESH Note before signing :1}  Medical Decision Making Amount and/or Complexity of Data Reviewed Labs: ordered. Radiology: ordered.   This patient is a 77 y.o. female who presents to the ED for concern of AMS, this involves an extensive number of treatment options, and is a complaint that carries with it a high risk of complications and morbidity. The emergent differential diagnosis prior to evaluation includes, but is not limited to,  Drug-related, hypoxia, hyper/hypoglycemia, encephalopathy, sepsis, DKA/HHS, brain lesion, CVA, seizure, environmental, psychiatric    This is not an exhaustive differential.   Past Medical History / Co-morbidities / Social History: history of hypertension, OSA, fibromyalgia, osteoarthritis, PE, CHF, COPD, pulmonary asbestosis  Physical Exam: Physical exam performed. The pertinent findings include: Alert and oriented and neurologically intact without focal deficits  Lab Tests: I ordered, and personally interpreted labs.  The pertinent results include:  hgb 11.6, tbili 1.4.   COVID, flu, RSV, vbg, second trop, and UA pending    Imaging Studies: I ordered imaging studies including CXR, CT head. I independently visualized and interpreted imaging which showed   CXR:  1. No active disease. 2. Stable bilateral pleural plaques.  CT head: no acute intracranial abnormality  I agree with the radiologist interpretation.   Cardiac Monitoring:  The patient was maintained on a cardiac monitor.  My attending physician Dr. Nechama Guard viewed and interpreted the cardiac monitored which showed an underlying rhythm of: RBBB, unchanged from previous, no STEMI. I agree with this interpretation.  Disposition:  Patient presents today with complaints of altered mental status.  She is afebrile, nontoxic-appearing, and in no acute distress with reassuring vital  signs.  She is also alert and oriented and neurologically intact with no focal neurologic deficits.  EMS did report that the patient was 88% on room air and placed on oxygen, however she has been satting 100% on room air throughout her stay today and denies any chest pain or shortness of breath.  She was able to walk to the bathroom with a walker which is her baseline without any hypoxia or tachypnea.  Laboratory evaluation and imaging thus far have been unremarkable for acute findings, awaiting COVID/flu/RSV, VBG, second troponin, and UA at shift change.  Suspect patient will likely be able to be discharged once these result.  Patient states that she feels like she could go home.  She is understanding and amenable with plan.  Discussed patient with Sherrell Puller, PA-C at shift change.  Please see their note for re-evaluation and dispo.   This is a shared visit with supervising physician Dr. Nechama Guard who has independently evaluated patient & provided guidance in evaluation/management/disposition, in agreement with care    Final Clinical Impression(s) / ED Diagnoses Final diagnoses:  None    Rx / DC Orders ED Discharge Orders     None

## 2023-02-23 NOTE — ED Provider Notes (Signed)
Physical Exam  BP (!) 148/76   Pulse 71   Temp 97.9 F (36.6 C) (Oral)   Resp (!) 21   Ht '5\' 4"'$  (1.626 m)   Wt 62.6 kg   SpO2 97%   BMI 23.69 kg/m   Physical Exam  Procedures  Procedures  ED Course / MDM    Medical Decision Making Amount and/or Complexity of Data Reviewed Labs: ordered. Radiology: ordered.  Risk Prescription drug management.   Accepted handoff at shift change from Lavonna Rua PA-C. Please see prior provider note for more detail.   Briefly: Patient is 77 y.o. F presenting to the ER today for evaluation of AMS, however patient is A&Ox4 while here with the previous provider and with myself today. She apparently was hypoxic with EMS, however has been satting well on RA while here. She was 96-100% while I was in the room. Ambulatory to the bathroom as well.   DDX: concern for acidosis, UTI, elevated trop  Plan: follow up on labs.   VBG shows normal pH.  Troponin at 3 within normal limits.  Urinalysis shows cloudy urine with nitrite positive, small leukocytes, many bacteria and 21-50 white blood cells.  Consistent with UTI.  Upon evaluation of patient's previous culture back in January 2024, it was pan sensitive.  Will place the patient on [place].  She was given a gram of Rocephin here.  Sent in a prescription for [place].  Urine culture pending as well.  Again, on my evaluation, patient is pleasant.  Alert and oriented x 4.  No CVA tenderness or belly tenderness.  Doubt any pyelonephritis at this time.  She is afebrile, no elevated white count.  She is safe for discharge home with close follow-up.  I discussed with the patient and strict return precautions were for symptoms.  She verbalized her understanding and agrees to the plan.  Patient is stable for discharge home.   Results for orders placed or performed during the hospital encounter of 02/23/23  Resp panel by RT-PCR (RSV, Flu A&B, Covid) Anterior Nasal Swab   Specimen: Anterior Nasal Swab  Result Value  Ref Range   SARS Coronavirus 2 by RT PCR NEGATIVE NEGATIVE   Influenza A by PCR NEGATIVE NEGATIVE   Influenza B by PCR NEGATIVE NEGATIVE   Resp Syncytial Virus by PCR NEGATIVE NEGATIVE  Comprehensive metabolic panel  Result Value Ref Range   Sodium 136 135 - 145 mmol/L   Potassium 3.8 3.5 - 5.1 mmol/L   Chloride 100 98 - 111 mmol/L   CO2 24 22 - 32 mmol/L   Glucose, Bld 106 (H) 70 - 99 mg/dL   BUN 10 8 - 23 mg/dL   Creatinine, Ser 0.78 0.44 - 1.00 mg/dL   Calcium 8.4 (L) 8.9 - 10.3 mg/dL   Total Protein 5.8 (L) 6.5 - 8.1 g/dL   Albumin 3.2 (L) 3.5 - 5.0 g/dL   AST 19 15 - 41 U/L   ALT 8 0 - 44 U/L   Alkaline Phosphatase 58 38 - 126 U/L   Total Bilirubin 1.4 (H) 0.3 - 1.2 mg/dL   GFR, Estimated >60 >60 mL/min   Anion gap 12 5 - 15  CBC with Differential/Platelet  Result Value Ref Range   WBC 6.2 4.0 - 10.5 K/uL   RBC 3.99 3.87 - 5.11 MIL/uL   Hemoglobin 11.6 (L) 12.0 - 15.0 g/dL   HCT 34.6 (L) 36.0 - 46.0 %   MCV 86.7 80.0 - 100.0 fL   MCH  29.1 26.0 - 34.0 pg   MCHC 33.5 30.0 - 36.0 g/dL   RDW 12.9 11.5 - 15.5 %   Platelets 147 (L) 150 - 400 K/uL   nRBC 0.0 0.0 - 0.2 %   Neutrophils Relative % 68 %   Neutro Abs 4.3 1.7 - 7.7 K/uL   Lymphocytes Relative 20 %   Lymphs Abs 1.2 0.7 - 4.0 K/uL   Monocytes Relative 7 %   Monocytes Absolute 0.4 0.1 - 1.0 K/uL   Eosinophils Relative 4 %   Eosinophils Absolute 0.2 0.0 - 0.5 K/uL   Basophils Relative 1 %   Basophils Absolute 0.0 0.0 - 0.1 K/uL   Immature Granulocytes 0 %   Abs Immature Granulocytes 0.02 0.00 - 0.07 K/uL  Urinalysis, Routine w reflex microscopic -Urine, Clean Catch  Result Value Ref Range   Color, Urine AMBER (A) YELLOW   APPearance CLOUDY (A) CLEAR   Specific Gravity, Urine 1.012 1.005 - 1.030   pH 5.0 5.0 - 8.0   Glucose, UA NEGATIVE NEGATIVE mg/dL   Hgb urine dipstick NEGATIVE NEGATIVE   Bilirubin Urine NEGATIVE NEGATIVE   Ketones, ur NEGATIVE NEGATIVE mg/dL   Protein, ur NEGATIVE NEGATIVE mg/dL    Nitrite POSITIVE (A) NEGATIVE   Leukocytes,Ua SMALL (A) NEGATIVE   RBC / HPF 0-5 0 - 5 RBC/hpf   WBC, UA 21-50 0 - 5 WBC/hpf   Bacteria, UA MANY (A) NONE SEEN   Squamous Epithelial / HPF 0-5 0 - 5 /HPF  Blood gas, venous  Result Value Ref Range   pH, Ven 7.45 (H) 7.25 - 7.43   pCO2, Ven 47 44 - 60 mmHg   pO2, Ven 40 32 - 45 mmHg   Bicarbonate 32.6 (H) 20.0 - 28.0 mmol/L   Acid-Base Excess 7.2 (H) 0.0 - 2.0 mmol/L   O2 Saturation 71.8 %   Patient temperature 36.6    Collection site VEIN   CBG monitoring, ED  Result Value Ref Range   Glucose-Capillary 98 70 - 99 mg/dL  Troponin I (High Sensitivity)  Result Value Ref Range   Troponin I (High Sensitivity) 3 <18 ng/L  Troponin I (High Sensitivity)  Result Value Ref Range   Troponin I (High Sensitivity) 3 <18 ng/L   *Note: Due to a large number of results and/or encounters for the requested time period, some results have not been displayed. A complete set of results can be found in Results Review.   CT Head Wo Contrast  Result Date: 02/23/2023 CLINICAL DATA:  Mental status change, unknown cause EXAM: CT HEAD WITHOUT CONTRAST TECHNIQUE: Contiguous axial images were obtained from the base of the skull through the vertex without intravenous contrast. RADIATION DOSE REDUCTION: This exam was performed according to the departmental dose-optimization program which includes automated exposure control, adjustment of the mA and/or kV according to patient size and/or use of iterative reconstruction technique. COMPARISON:  03/26/2022 FINDINGS: Brain: Mild age related volume loss. No acute intracranial abnormality. Specifically, no hemorrhage, hydrocephalus, mass lesion, acute infarction, or significant intracranial injury. Vascular: No hyperdense vessel or unexpected calcification. Skull: No acute calvarial abnormality. Sinuses/Orbits: No acute findings Other: None IMPRESSION: No acute intracranial abnormality. Electronically Signed   By: Rolm Baptise  M.D.   On: 02/23/2023 19:37   DG Chest Port 1 View  Result Date: 02/23/2023 CLINICAL DATA:  Altered mental status. EXAM: PORTABLE CHEST 1 VIEW COMPARISON:  Chest radiograph dated 03/26/2022. FINDINGS: Stable bilateral calcified pleural plaques. No focal consolidation, pleural effusion, pneumothorax. The  cardiac silhouette is within limits. Atherosclerotic calcification of the aorta. No acute osseous pathology. IMPRESSION: 1. No active disease. 2. Stable bilateral pleural plaques. Electronically Signed   By: Anner Crete M.D.   On: 02/23/2023 18:32       Sherrell Puller, PA-C 02/23/23 2229    Elgie Congo, MD 02/24/23 [phone]

## 2023-02-24 ENCOUNTER — Other Ambulatory Visit: Payer: Self-pay | Admitting: Family Medicine

## 2023-02-24 NOTE — Telephone Encounter (Signed)
Pt was last seen 01/10/23; 2 follow ups scheduled after that were unsuccessful.  CSC 12/28/20 UDS 12/26/22 Danella Sensing NP  Please fill, if appropriate.

## 2023-02-25 LAB — URINE CULTURE: Culture: >=100000 — AB

## 2023-02-26 ENCOUNTER — Telehealth (HOSPITAL_BASED_OUTPATIENT_CLINIC_OR_DEPARTMENT_OTHER): Payer: Self-pay

## 2023-02-26 ENCOUNTER — Telehealth: Payer: Self-pay

## 2023-02-26 DIAGNOSIS — M6281 Muscle weakness (generalized): Secondary | ICD-10-CM | POA: Diagnosis not present

## 2023-02-26 NOTE — Telephone Encounter (Signed)
Post ED Visit - Positive Culture Follow-up  Culture report reviewed by antimicrobial stewardship pharmacist: [place] Team '[]'$  Elenor Quinones, Pharm.D. '[]'$  Heide Guile, Pharm.D., BCPS AQ-ID '[x]'$  Esmeralda Arthur, Pharm D, BCCCP '[]'$  Alycia Rossetti, Pharm.D., BCPS '[]'$  Meta, Pharm.D., BCPS, AAHIVP '[]'$  Legrand Como, Pharm.D., BCPS, AAHIVP '[]'$  Salome Arnt, PharmD, BCPS '[]'$  Johnnette Gourd, PharmD, BCPS '[]'$  Hughes Better, PharmD, BCPS '[]'$  Leeroy Cha, PharmD '[]'$  Laqueta Linden, PharmD, BCPS '[]'$  Albertina Parr, PharmD  [place] Team '[]'$  Leodis Sias, PharmD '[]'$  Lindell Spar, PharmD '[]'$  Royetta Asal, PharmD '[]'$  Graylin Shiver, Rph '[]'$  Rema Fendt) Glennon Mac, PharmD '[]'$  Arlyn Dunning, PharmD '[]'$  Netta Cedars, PharmD '[]'$  Dia Sitter, PharmD '[]'$  Leone Haven, PharmD '[]'$  Gretta Arab, PharmD '[]'$  Theodis Shove, PharmD '[]'$  Peggyann Juba, PharmD '[]'$  Reuel Boom, PharmD   Positive urine culture Treated with Cefdinir, organism sensitive to the same and no further patient follow-up is required at this time.  Glennon Hamilton 02/26/2023, 9:32 AM

## 2023-02-26 NOTE — Patient Outreach (Signed)
  Care Coordination   02/26/2023 Name: Linda [name] MRN: [id number] DOB: [birth year]    Care Coordination Outreach Attempts:  An unsuccessful outreach attempt was made today to try to reach patient.     Follow Up Plan:  Additional outreach attempts will be made to offer the patient care coordination information and services.   Encounter Outcome:  No Answer   Care Coordination Interventions:  No, not indicated    Enzo Montgomery, RN,BSN,CCM [facility] Care Management Care Management Community Coordinator Direct Phone: [phone] Toll Free: [phone] Fax: [phone]

## 2023-03-05 IMAGING — CR DG HIP (WITH OR WITHOUT PELVIS) 2-3V*R*
3 series · 3 of 3 positions shown · non-contrast
Comparison: 07/14/2021

CLINICAL DATA: Low back and hip pain

EXAM:
DG HIP (WITH OR WITHOUT PELVIS) 2-3V RIGHT

[w pelvis upright]
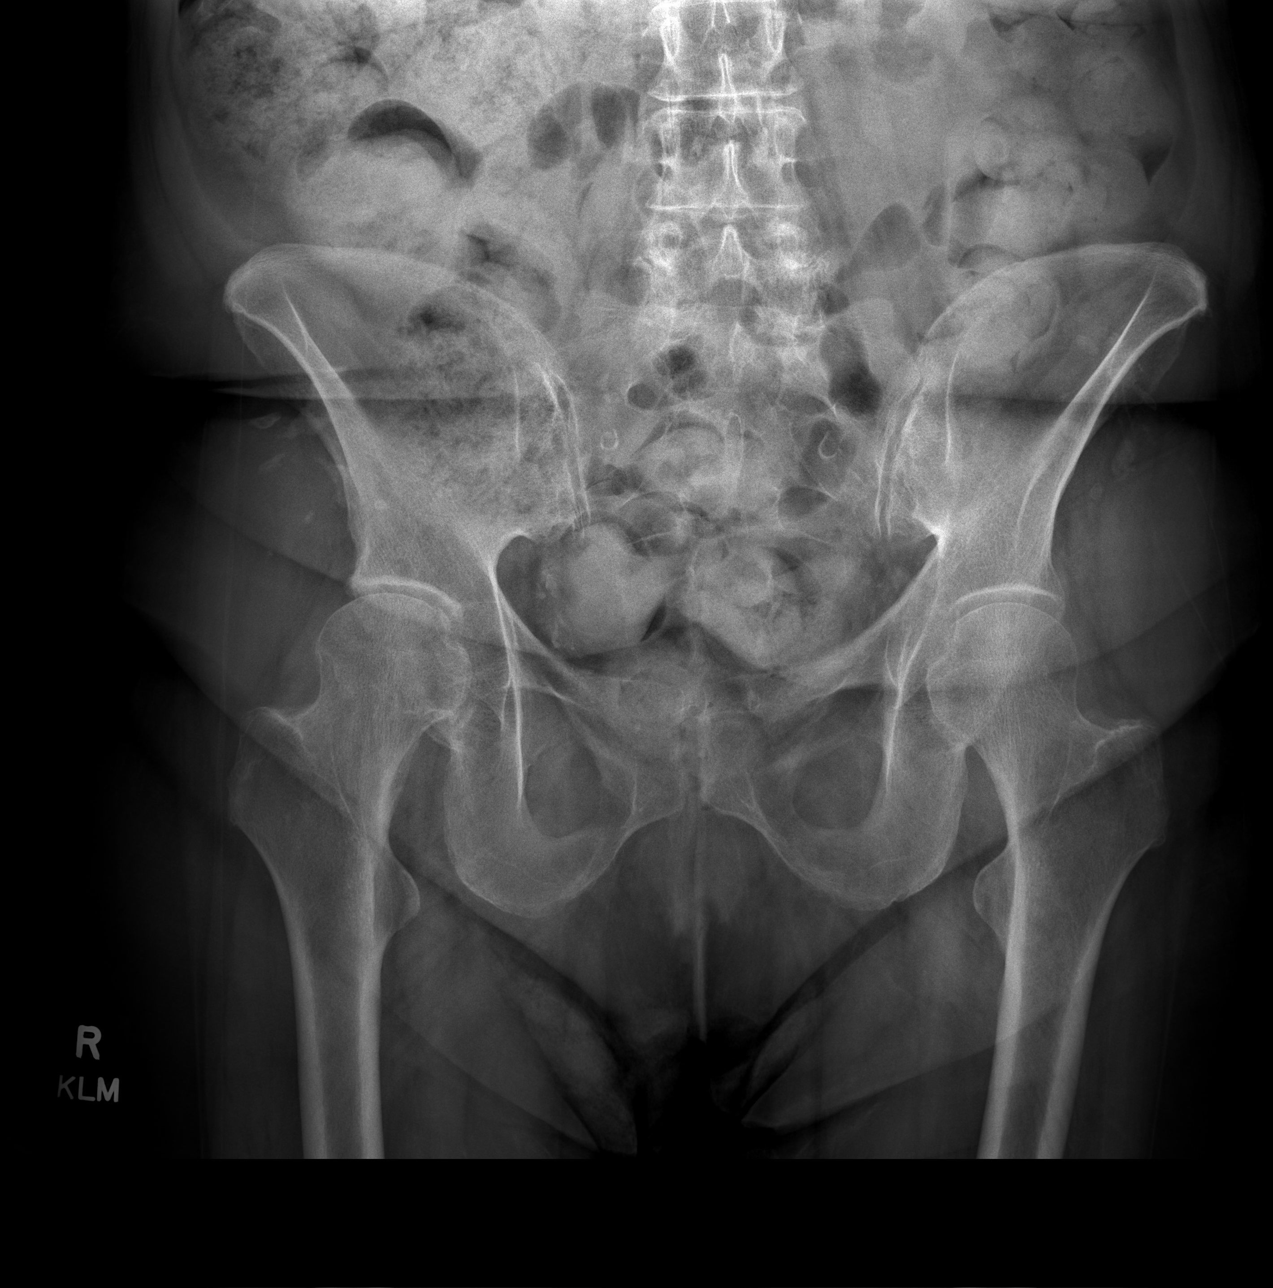

[w hip ap right]
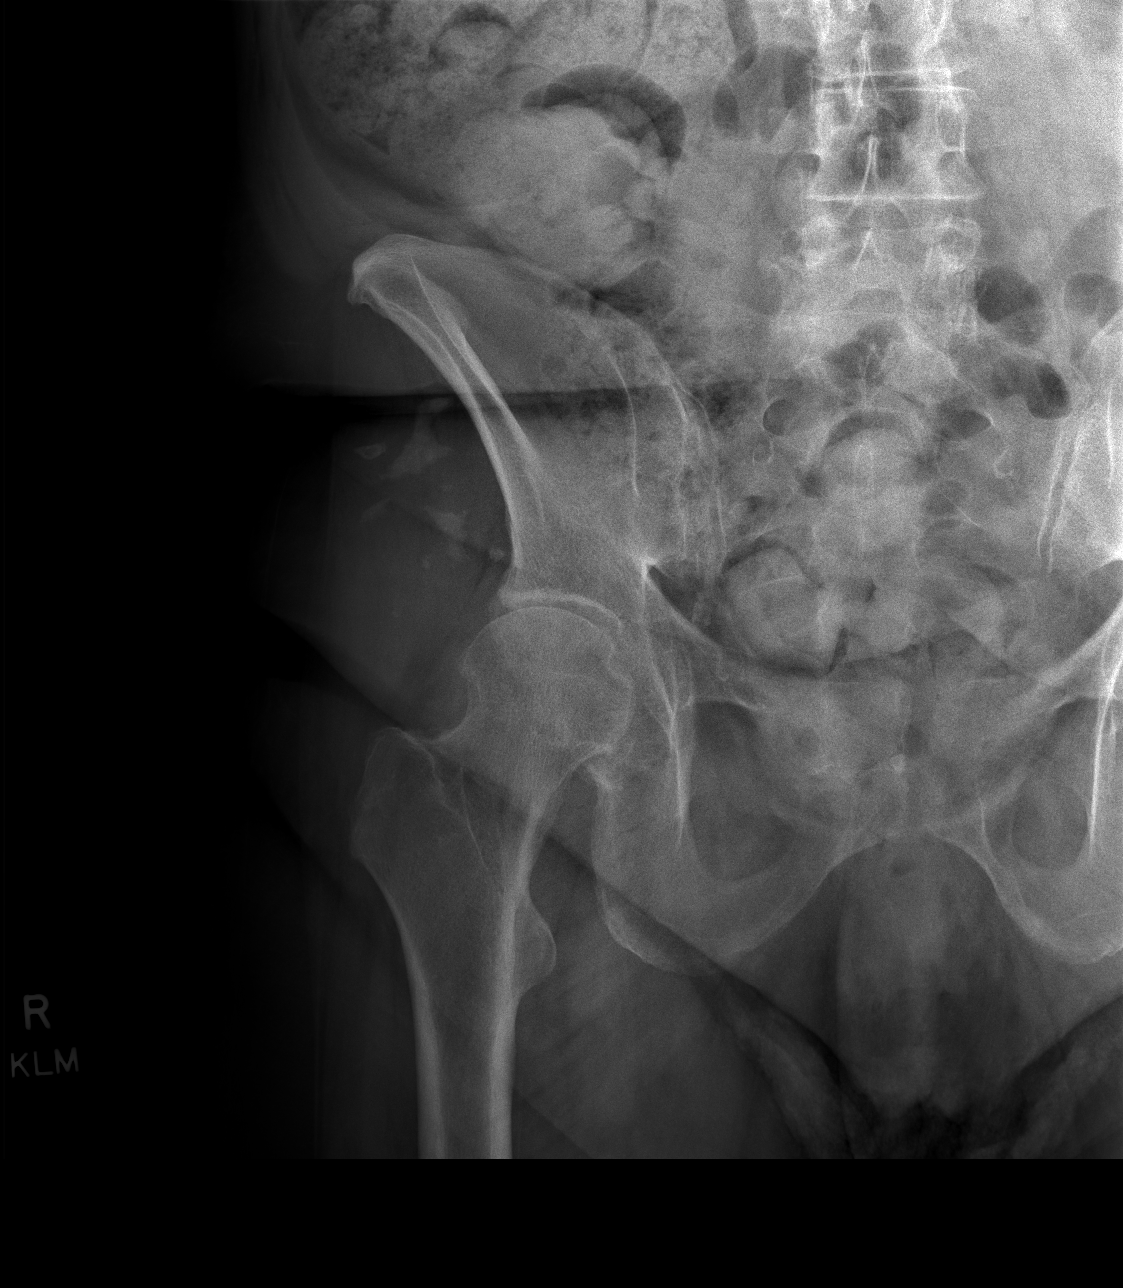

[w hip frog right]
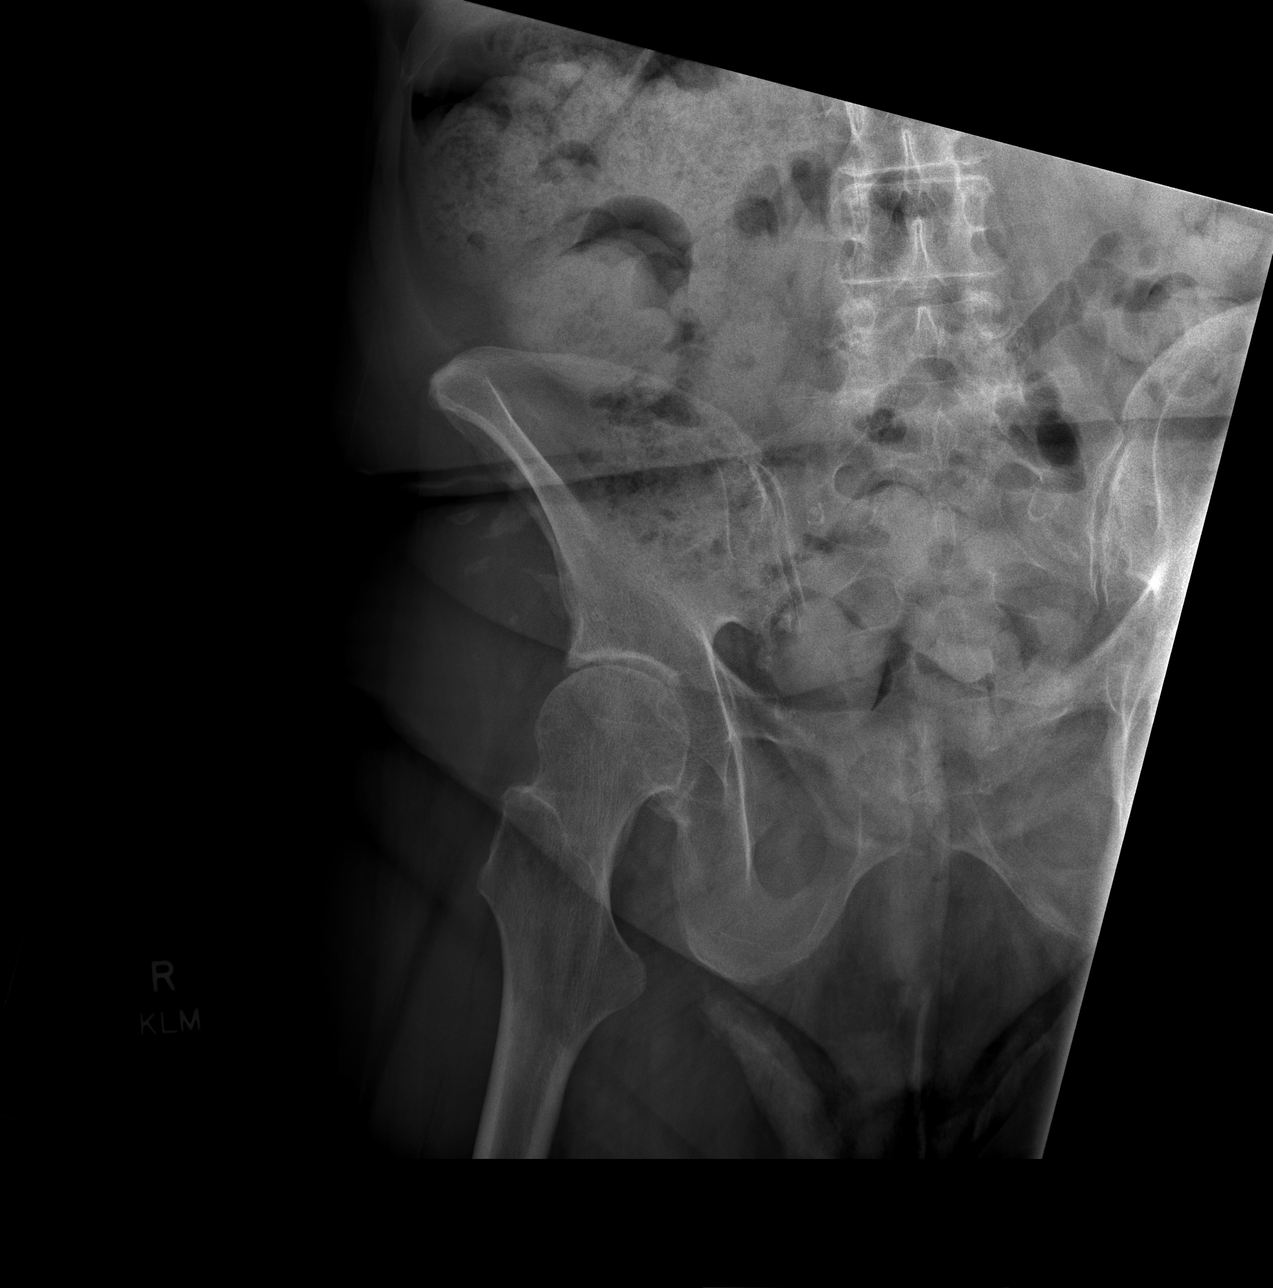

[3 of 3 positions shown; findings below may reference images not displayed]

FINDINGS: Normal right hip alignment without acute osseous finding or
fracture. No subluxation or dislocation. Slight joint space loss and
sclerosis of the right superior acetabulum.

Bony pelvis and hips appear symmetric and intact. No significant
change compared to the prior study. Normal SI joints for age.
Nonobstructive bowel gas pattern.
IMPRESSION: Minor degenerative changes. No acute osseous finding or significant
interval change.

## 2023-03-05 IMAGING — CR DG LUMBAR SPINE COMPLETE 4+V
5 series · 5 of 5 positions shown · non-contrast
Comparison: 10/21/2017

CLINICAL DATA: Chronic low back pain, sciatica

EXAM:
LUMBAR SPINE - COMPLETE 4+ VIEW

[t lumbar spine ap]
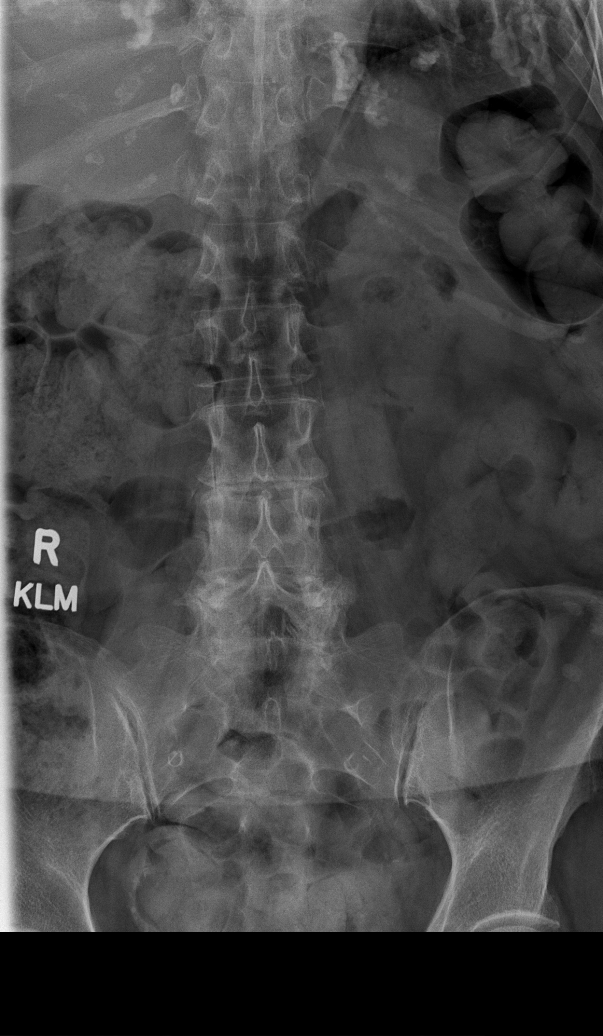

[t lumbar spine obl (1 of 2)]
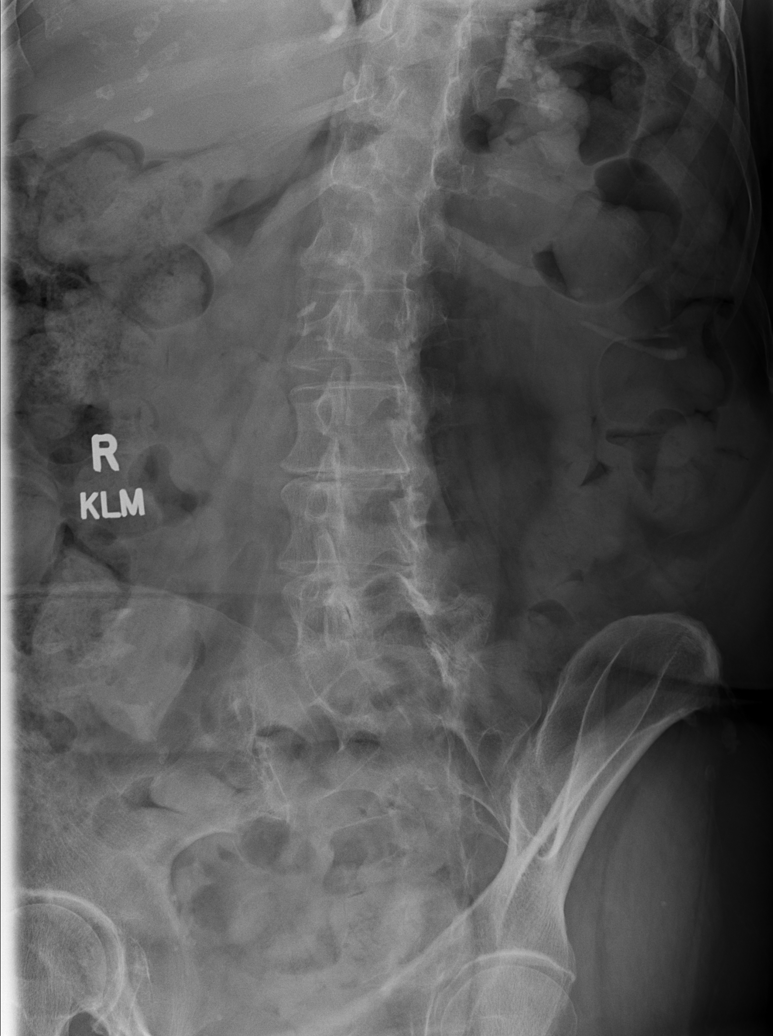

[t lumbar spine obl (2 of 2)]
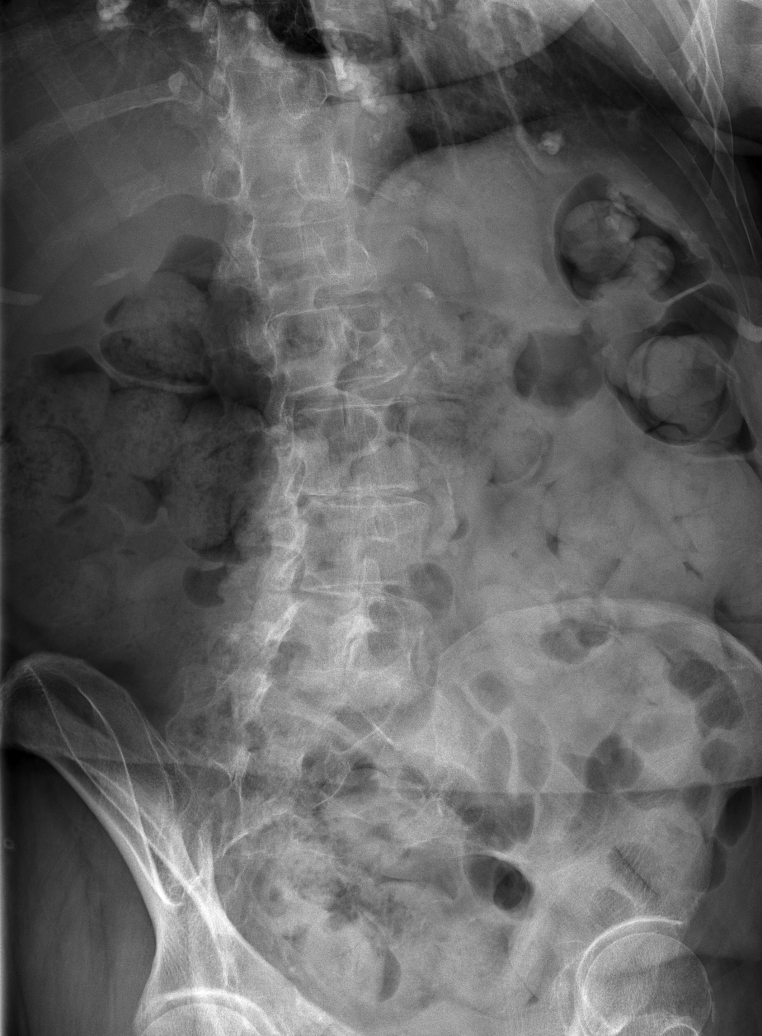

[t lumbar spine lat]
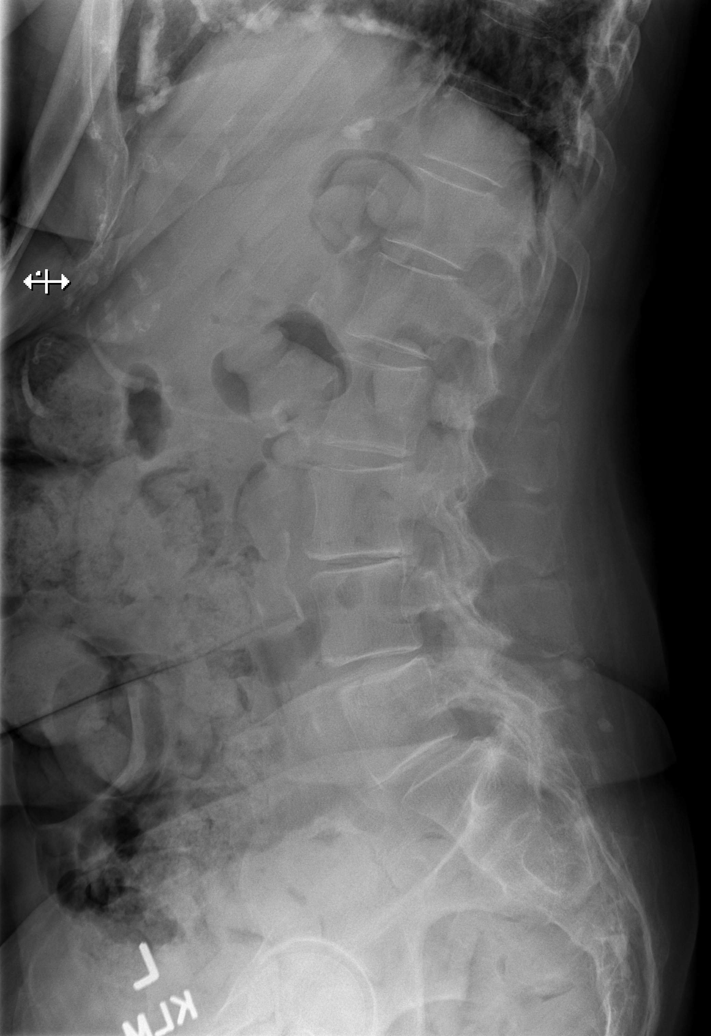

[t lumbar l-5 s-1 spot]
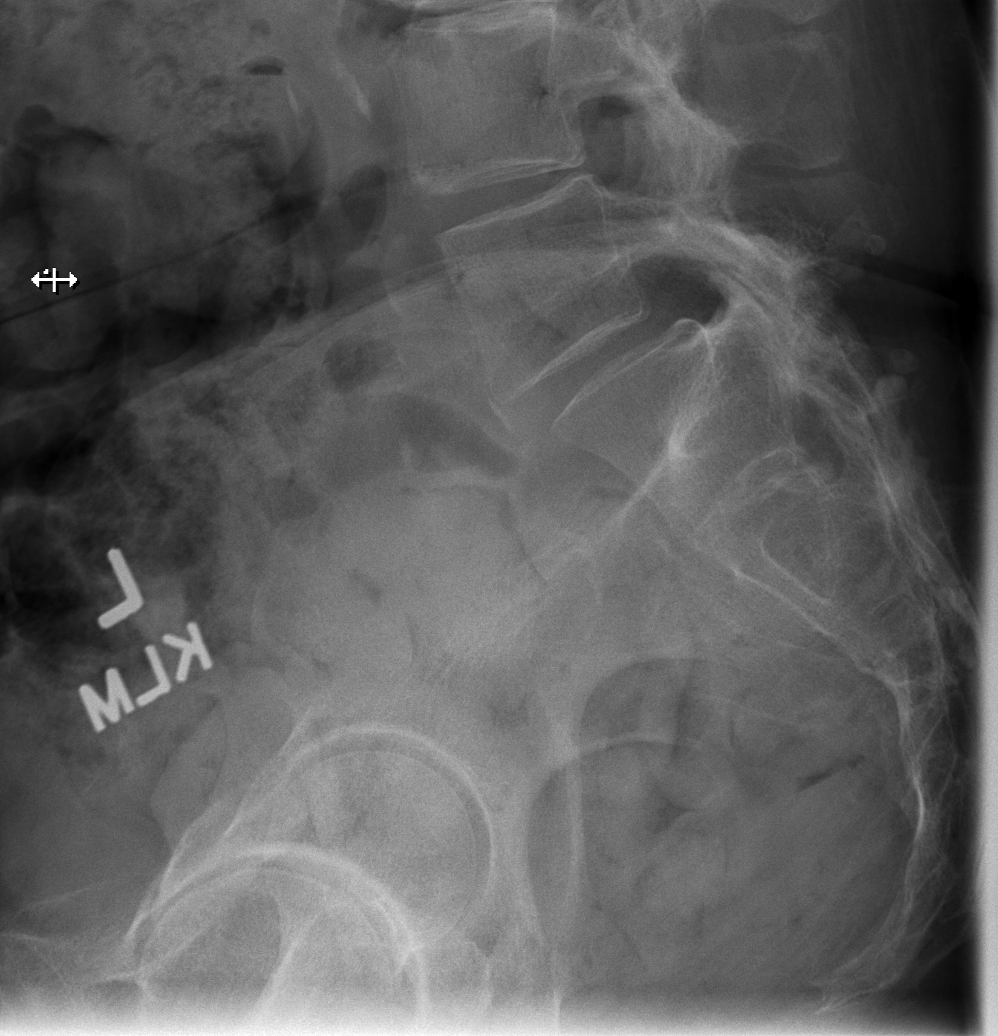

[5 of 5 positions shown; findings below may reference images not displayed]

FINDINGS: Preserved vertebral body heights. No acute compression fracture,
wedge-shaped deformity or focal kyphosis. Alignment demonstrates
grade 1 anterolisthesis of L5 upon S1 measuring 5 mm appearing facet
mediated. Facet arthropathy most pronounced at L4-5 and L5-S1. Disc
space narrowing at L3-4. No pars defects. Normal pedicles and SI
joints for age.

Aorta atherosclerotic. Nonobstructive bowel gas pattern. Moderate
colonic stool burden noted.
IMPRESSION: Degenerative changes as above.

No acute finding by plain radiography

Aortic Atherosclerosis ([id number]).

## 2023-03-10 ENCOUNTER — Other Ambulatory Visit (HOSPITAL_COMMUNITY): Payer: Self-pay | Admitting: Cardiology

## 2023-03-10 DIAGNOSIS — I6523 Occlusion and stenosis of bilateral carotid arteries: Secondary | ICD-10-CM

## 2023-03-10 DIAGNOSIS — M6281 Muscle weakness (generalized): Secondary | ICD-10-CM | POA: Diagnosis not present

## 2023-03-12 DIAGNOSIS — M6281 Muscle weakness (generalized): Secondary | ICD-10-CM | POA: Diagnosis not present

## 2023-03-17 DIAGNOSIS — M6281 Muscle weakness (generalized): Secondary | ICD-10-CM | POA: Diagnosis not present

## 2023-03-18 ENCOUNTER — Other Ambulatory Visit: Payer: Self-pay | Admitting: Physical Medicine and Rehabilitation

## 2023-03-18 ENCOUNTER — Telehealth: Payer: Self-pay

## 2023-03-18 DIAGNOSIS — M1712 Unilateral primary osteoarthritis, left knee: Secondary | ICD-10-CM

## 2023-03-18 DIAGNOSIS — M1711 Unilateral primary osteoarthritis, right knee: Secondary | ICD-10-CM

## 2023-03-18 MED ORDER — OXYCODONE-ACETAMINOPHEN 10-325 MG PO TABS: 1.0000 | ORAL_TABLET | Freq: Four times a day (QID) | ORAL | 0 refills | Status: DC | PRN

## 2023-03-18 NOTE — Telephone Encounter (Signed)
Eunice & Dr. Naaman Plummer are both on vacation this week.   Mrs. Weisbrodt will be out of her Oxycodone on this Monday. Will you please send a refill to [facility]. While they have it in stock.

## 2023-03-19 DIAGNOSIS — M6281 Muscle weakness (generalized): Secondary | ICD-10-CM | POA: Diagnosis not present

## 2023-03-25 NOTE — Telephone Encounter (Signed)
Rx sent last week by Dr. Ranell Patrick.

## 2023-03-26 ENCOUNTER — Encounter: Payer: Self-pay | Admitting: Nurse Practitioner

## 2023-03-26 ENCOUNTER — Ambulatory Visit: Payer: Medicare Other | Attending: Nurse Practitioner | Admitting: Nurse Practitioner

## 2023-03-26 VITALS — BP 100/58 | HR 67 | Ht 64.0 in | Wt 140.0 lb

## 2023-03-26 DIAGNOSIS — I2510 Atherosclerotic heart disease of native coronary artery without angina pectoris: Secondary | ICD-10-CM

## 2023-03-26 DIAGNOSIS — I10 Essential (primary) hypertension: Secondary | ICD-10-CM | POA: Diagnosis not present

## 2023-03-26 DIAGNOSIS — R072 Precordial pain: Secondary | ICD-10-CM

## 2023-03-26 DIAGNOSIS — M79661 Pain in right lower leg: Secondary | ICD-10-CM

## 2023-03-26 DIAGNOSIS — M79662 Pain in left lower leg: Secondary | ICD-10-CM | POA: Diagnosis not present

## 2023-03-26 DIAGNOSIS — E785 Hyperlipidemia, unspecified: Secondary | ICD-10-CM | POA: Diagnosis not present

## 2023-03-26 DIAGNOSIS — I6523 Occlusion and stenosis of bilateral carotid arteries: Secondary | ICD-10-CM

## 2023-03-26 DIAGNOSIS — I5032 Chronic diastolic (congestive) heart failure: Secondary | ICD-10-CM | POA: Diagnosis not present

## 2023-03-26 NOTE — Patient Instructions (Signed)
Medication Instructions:  Your physician recommends that you continue on your current medications as directed. Please refer to the Current Medication list given to you today.  *If you need a refill on your cardiac medications before your next appointment, please call your pharmacy*   Lab Work: BMET today  If you have labs (blood work) drawn today and your tests are completely normal, you will receive your results only by: Craig (if you have MyChart) OR A paper copy in the mail If you have any lab test that is abnormal or we need to change your treatment, we will call you to review the results.   Testing/Procedures: Your physician has requested that you have an ankle brachial index (ABI). During this test an ultrasound and blood pressure cuff are used to evaluate the arteries that supply the arms and legs with blood. Allow thirty minutes for this exam. There are no restrictions or special instructions.     Follow-Up: At [facility], you and your health needs are our priority.  As part of our continuing mission to provide you with exceptional heart care, we have created designated Provider Care Teams.  These Care Teams include your primary Cardiologist (physician) and Advanced Practice Providers (APPs -  Physician Assistants and Nurse Practitioners) who all work together to provide you with the care you need, when you need it.  We recommend signing up for the patient portal called "MyChart".  Sign up information is provided on this After Visit Summary.  MyChart is used to connect with patients for Virtual Visits (Telemedicine).  Patients are able to view lab/test results, encounter notes, upcoming appointments, etc.  Non-urgent messages can be sent to your provider as well.   To learn more about what you can do with MyChart, go to [email or web address].    Your next appointment:   4-6 week(s)  Provider:   Diona Browner, NP        Other Instructions

## 2023-03-26 NOTE — Progress Notes (Signed)
Office Visit    Patient Name: Linda [name] Date of Encounter: 03/26/2023  Primary Care Provider:  Tammi Sou, MD Primary Cardiologist:  [name], MD  Chief Complaint    77 year old female with a history of nonobstructive CAD, chronic chest pain, chronic diastolic heart failure, carotid artery stenosis, hypertension, and hyperlipidemia who presents for follow-up related to CAD and chest pain.  Past Medical History    Past Medical History:  Diagnosis Date   Acute upper GI bleed 06/2014   while pt taking coumadin, plavix, and meloxicam---despite being told not to take coumadin.   Anginal pain    Nonobstructive CAD 2014; however, her cardiologist put her on a statin for this and NOT for hyperlipidemia per pt report.  Atyp CP 08/2017 at card f/u, plan for myoc perf imaging.   Anxiety    panic attacks   Asthma    w/ asbestososis    BPPV (benign paroxysmal positional vertigo) 12/16/2012   Chronic diastolic CHF (congestive heart failure)    dry wt as of 11/06/16 is 168 lbs.   Chronic lower back pain    COPD (chronic obstructive pulmonary disease)    DDD (degenerative disc disease)    lumbar and cervical.    Diverticular disease    Fibromyalgia    Patient states dx was around her late 15s but she had sx's for years prior to this.   H/O hiatal hernia    History of pneumonia    hospitalized 12/2011, 02/2013, and 07/2013 [facility]) for this   HTN (hypertension)    Renal artery dopplers 04/2013 neg for stenosis.   Hypervitaminosis D 09/27/2019   over-supplemented.  Stopped vit D and plan recheck 2 mo.   Idiopathic angio-edema-urticaria 72014; 2021   Angioedema component was very minimal.  2021->Dr. Bobbitt (allergist) eval.   Insomnia    Iron deficiency anemia    Hematologist in [place], [place] did extensive w/u; no cause found; failed oral supplement;; gets fairly regular (q44m or so) IV iron infusions (Venofer -iron sucrose- 200mg  with procrit.  "for 14 yr I've been  getting blood work q month & getting infusions prn" (07/12/2013).  Dr. Marin Olp locally, iron infusions done, EPO deficiency dx'd   Kidney stone    Migraine syndrome    "not as often anymore; used to be ~ q wk" (07/12/2013)   Mixed incontinence urge and stress    Nephrolithiasis    "passed all on my own or they are still in there" (07/12/2013)   Neuroleptic induced parkinsonism 2018   Dr. Carles Collet, neuro, saw her 11/24/17 and recommended d/c of abilify as first step.  D/c'd abilify and pt got complete recovery.   Oropharyngeal dysphagia    swallowing study speech path 05/2020. Gastric bx's showed gastritis, h pylori NEG   OSA on CPAP    prior to move to [place]--had another sleep study 10/2015 w/pulm Dr. Camillo Flaming.   Osteoarthritis    "severe; progressing fast" (07/12/2013); multiple joints-not surgical candidate for TKR (03/2015).  Triamcinolon knee injections by Dr. Tessa Lerner 12/2017.   Pernicious anemia 08/24/2014   Pleural plaque with presence of asbestos 07/22/2013   Pulmonary embolism 07/2013   Dx at [facility] with very small peripheral upper lobe pe 07/2013: pt took coumadin for about 8-9 mo   Pyelonephritis    "several times over the last 30 yr" (07/12/2013)   RBBB (right bundle branch block)    Recurrent major depression    Recurrent UTI    hx of hospitalization for  pyelonephritis; started abx prophylaxis 06/2015   Syncope    Hypotensive; ED visit--Dr. Terrence Dupont did Cath--nonobstructive CAD, EF 55-60%.  In retrospect, suspect pt rx med misuse/polypharmacy   Vertebral compression fracture    Acute T12 on 02/27/22 (fall).  multiple old thoracic-->neurosurg to do MRI as of 05/2022   Past Surgical History:  Procedure Laterality Date   APPENDECTOMY  1960   AXILLARY SURGERY Left 1978   Multiple "lump" in armpit per pt   BIOPSY  06/17/2020   Gastric bx->gastritis, h pylori neg.  Procedure: BIOPSY;  Surgeon: Carol Ada, MD;  Location: [facility];  Service: Endoscopy;;   CARDIAC CATHETERIZATION  01/2013    nonobstructive CAD, EF 55-60%   CARDIOVASCULAR STRESS TEST  02/22/2015   Low risk myocard perf imaging; wall motion normal, normal EF   CARDIOVASCULAR STRESS TEST     09/2017 myo perf low risk   carotid duplex doppler  10/21/2017   R vertebral flow suggestive of possible distal obstruction.  Pt declined further w/u as of 10/29/17 but need to revisit this problem periodically.   COCCYX REMOVAL  1972   DEXA  06/05/2017   T-score -3.1   DILATION AND CURETTAGE OF UTERUS  ? 1970's   ESOPHAGOGASTRODUODENOSCOPY N/A 07/19/2014   Gastritis found + in the setting of supratherapeutic INR, +plavix, + meloxicam.   ESOPHAGOGASTRODUODENOSCOPY (EGD) WITH PROPOFOL N/A 06/17/2020   NO stricture or other prob to explain pt's dysphagia, dilation was done anyway.  Gastric bx's-->gastritis, h pylori neg. Procedure: ESOPHAGOGASTRODUODENOSCOPY (EGD) WITH PROPOFOL;  Surgeon: Carol Ada, MD;  Location: [facility];  Service: Endoscopy;  Laterality: N/A;   EYE SURGERY Left 2012-2013   "injections for ~ 1 yr; don't really know what for" (07/12/2013)   HEEL SPUR SURGERY Left 2008   kidney stone removal Right    KNEE SURGERY  2005   LEFT HEART CATHETERIZATION WITH CORONARY ANGIOGRAM N/A 01/30/2013   Procedure: LEFT HEART CATHETERIZATION WITH CORONARY ANGIOGRAM;  Surgeon: Clent Demark, MD;  Location: [facility];  Service: Cardiovascular;  Laterality: N/A;   MALONEY DILATION  06/17/2020   Procedure: Venia Minks DILATION;  Surgeon: Carol Ada, MD;  Location: [facility];  Service: Endoscopy;;   PLANTAR FASCIA RELEASE Left 2008   SPIROMETRY  04/25/2014   In hosp for acute asthma/COPD flare: mixed obstructive and restrictive lung disease. The FEV1 is severely reduced at 45% predicted.  FEV1 signif decreased compared to prior spirometry 07/23/13.   TENDON RELEASE  1996   Right forearm and hand   TOTAL ABDOMINAL HYSTERECTOMY  1974   TRANSTHORACIC ECHOCARDIOGRAM  01/2013; 04/2014;08/2015; 09/2017   2014--NORMAL.   2015--focal basal septal hypertrophy, EF 55-60%, grade I diast dysfxn, mild LAE.  08/2015 EF 55-60%, nl LV syst fxn, grade I DD, valves wnl. 10/21/17: EF 65-70%, grd I DD, o/w normal. 02/17/22 EF 70-75%, hyperdynamic LV fxn, grd I DD.    Allergies  Allergies  Allergen Reactions   Aripiprazole Other (See Comments) and Rash    parkinsonism Other reaction(s): Other parkinsonism  parkinsonism parkinsonism   Penicillins Itching, Swelling, Rash and Shortness Of Breath    Tolerated Cefepime in ED. Has patient had a PCN reaction causing immediate rash, facial/tongue/throat swelling, SOB or lightheadedness with hypotension: Yes Has patient had a PCN reaction causing severe rash involving mucus membranes or skin necrosis: No Has patient had a PCN reaction that required hospitalization: No  Has patient had a PCN reaction occurring within the last 10 years: No  Tolerated Cefepime in ED. Has  patient had a PCN reaction causing immediate rash, facial/tongue/throat swelling, SOB or lightheadedness with hypotension: Yes Has patient had a PCN reaction causing severe rash involving mucus membranes or skin necrosis: No Has patient had a PCN reaction that required hospitalization: No  Has patient had a PCN reaction occurring within the last 10 years: No Rash,swelling Tolerated Cefepime in ED.  Rash,swelling  Tolerated Cefepime in ED.  Tolerated Cefepime in ED. Has patient had a PCN reaction causing immediate rash, facial/tongue/throat swelling, SOB or lightheadedness with hypotension: Yes Has patient had a PCN reaction causing severe rash involving mucus membranes or skin necrosis: No Has patient had a PCN reaction that required hospitalization: No  Has patient had a PCN reaction occurring within the last 10 years: No   Bactrim [Sulfamethoxazole-Trimethoprim] Rash     Labs/Other Studies Reviewed    The following studies were reviewed today: Carotid ultrasound 01/2023: Summary:  Right Carotid: Velocities  in the right ICA are consistent with a 1-39%  stenosis.   Left Carotid: Velocities in the left ICA are consistent with a 1-39%  stenosis.   Vertebrals: Left vertebral artery demonstrates antegrade flow. Right  vertebral              artery demonstrates high resistant flow.  Subclavians: Normal flow hemodynamics were seen in bilateral subclavian               arteries.   *See table(s) above for measurements and observations.  Suggest follow up study in 12 months.   Echo 01/2022: IMPRESSIONS     1. Left ventricular ejection fraction, by estimation, is 70 to 75%. The  left ventricle has hyperdynamic function. The left ventricle has no  regional wall motion abnormalities. Left ventricular diastolic parameters  are consistent with Grade I diastolic  dysfunction (impaired relaxation).   2. Right ventricular systolic function is normal. The right ventricular  size is normal. Tricuspid regurgitation signal is inadequate for assessing  PA pressure.   3. The mitral valve is normal in structure. Trivial mitral valve  regurgitation. No evidence of mitral stenosis.   4. The aortic valve is tricuspid. Aortic valve regurgitation is not  visualized. Aortic valve sclerosis is present, with no evidence of aortic  valve stenosis.   5. The inferior vena cava is normal in size with greater than 50%  respiratory variability, suggesting right atrial pressure of 3 mmHg.   Recent Labs: 02/23/2023: ALT 8; BUN 10; Creatinine, Ser 0.78; Hemoglobin 11.6; Platelets 147; Potassium 3.8; Sodium 136  Recent Lipid Panel    Component Value Date/Time   CHOL 113 01/01/2023 1336   TRIG 102.0 01/01/2023 1336   HDL 42.20 01/01/2023 1336   CHOLHDL 3 01/01/2023 1336   VLDL 20.4 01/01/2023 1336   LDLCALC 51 01/01/2023 1336    History of Present Illness    77 year old female with the above past medical history including nonobstructive CAD, chronic chest pain, chronic diastolic heart failure, carotid artery  stenosis, hypertension, and hyperlipidemia.   Prior cardiac catheterization in February 2014 showed nonobstructive (no significant 50 to 60% RCA stenosis), normal LV function, note of 4+ MR.  Renal Dopplers are evidence of stenosis.  She has a history of presumed orthostatic syncope.  She also has had problems with diastolic heart failure.  Nuclear study in October 2018 showed EF 81%, probable shifting breast attenuation, however, very mild distal anteroseptal ischemia could not be excluded. She was treated medically.  Carotid Dopplers in February 2023 showed 40 to 59% R ICA  stenosis, 1 to [id number] LICA stenosis.  Echocardiogram in 01/2022 showed EF 70 to 35%, vigorous LV function, G1 DD.  She was last seen in the office on 01/22/2023 and noted ongoing complaints of substernal nonexertional, nonradiating chest pain as well as dyspnea on exertion.  Coronary CT angiogram was ordered and is pending.  Repeat carotid ultrasound in 01/2023 showed 1 to 3% B ICA stenosis.  Repeat study was recommended in 1 year.  She presents today for follow-up.  Since her last visit she has been stable overall from a cardiac standpoint.  She does note that over the past 8 to 10 months she has had an aching and throbbing sensation in her legs both with activity and at rest.  She notes her coronary CTA is scheduled for 04/02/2023.  Home Medications    Current Outpatient Medications  Medication Sig Dispense Refill   albuterol (2.5 MG/3ML) 0.083% NEBU 3 mL, albuterol (5 MG/ML) 0.5% NEBU 0.5 mL Take 5 mg by nebulization. PRN     albuterol (VENTOLIN HFA) 108 (90 Base) MCG/ACT inhaler Inhale 1-2 puffs into the lungs every 6 (six) hours as needed for wheezing or shortness of breath. 18 g 5   ALPRAZolam (XANAX) 0.5 MG tablet TAKE ONE TABLET BY MOUTH THREE TIMES DAILY 90 tablet 5   amLODipine (NORVASC) 5 MG tablet TAKE ONE TABLET BY MOUTH DAILY 90 tablet 3   aspirin 81 MG tablet Take 81 mg by mouth at bedtime.     cefdinir (OMNICEF) 300 MG  capsule Take 1 capsule (300 mg total) by mouth 2 (two) times daily. 14 capsule 0   cyclobenzaprine (FLEXERIL) 5 MG tablet Take 5 mg by mouth 2 (two) times daily as needed.     diclofenac Sodium (VOLTAREN) 1 % GEL Apply 2 g topically 4 (four) times daily. 4 times a day to both knees 1000 each 4   donepezil (ARICEPT) 10 MG tablet Take 1 tablet (10 mg total) by mouth at bedtime. 90 tablet 1   DULoxetine (CYMBALTA) 60 MG capsule Take 1 capsule (60 mg total) by mouth daily. 1 tab p.o. daily 90 capsule 1   EPINEPHrine 0.3 mg/0.3 mL IJ SOAJ injection Inject 0.3 mg into the muscle as needed for anaphylaxis. 1 each 1   ipratropium (ATROVENT) 0.03 % nasal spray Place 2 sprays into both nostrils every 12 (twelve) hours as needed (allergies). 30 mL 5   isosorbide mononitrate (IMDUR) 30 MG 24 hr tablet TAKE 2 TABLETS IN THE morning AND 1 TABLET IN THE evening. Need office visit for future refills. 270 tablet 1   lamoTRIgine (LAMICTAL) 200 MG tablet Take 1 tablet (200 mg total) by mouth daily. 90 tablet 1   methocarbamol (ROBAXIN) 500 MG tablet Take 1 tablet (500 mg total) by mouth every 6 (six) hours as needed for muscle spasms. 80 tablet 2   metoprolol succinate (TOPROL-XL) 100 MG 24 hr tablet take ONE tab daily, take additional 1/2 tab as needed FOR FOR BLOOD PRESSURE > 160/90 135 tablet 1   metoprolol tartrate (LOPRESSOR) 50 MG tablet Take 2 hours prior to CT scan 1 tablet 0   Misc Natural Products (NEURIVA PO) Take by mouth.     Multiple Vitamin (MULTIVITAMIN WITH MINERALS) TABS tablet Take 1 tablet by mouth daily.     nitroGLYCERIN (NITROSTAT) 0.4 MG SL tablet Place 1 tablet (0.4 mg total) under the tongue every 5 (five) minutes as needed for chest pain (x 3 doses). 25 tablet 11   oxyCODONE-acetaminophen (PERCOCET)  10-325 MG tablet Take 1 tablet by mouth every 6 (six) hours as needed for pain. 120 tablet 0   pantoprazole (PROTONIX) 40 MG tablet Take 1 tablet (40 mg total) by mouth 2 (two) times daily. 180  tablet 1   rOPINIRole (REQUIP) 0.5 MG tablet TAKE ONE TABLET BY MOUTH AT BEDTIME 90 tablet 1   rosuvastatin (CRESTOR) 20 MG tablet Take 1 tablet (20 mg total) by mouth every evening. 90 tablet 1   solifenacin (VESICARE) 10 MG tablet TAKE 1 TABLET ONCE A DAY. 90 tablet 0   traZODone (DESYREL) 50 MG tablet take 2-4 tablets at bedtime as needed for insomnia. 120 tablet 5   No current facility-administered medications for this visit.     Review of Systems    She denies palpitations, dyspnea, pnd, orthopnea, n, v, dizziness, syncope, edema, weight gain, or early satiety. All other systems reviewed and are otherwise negative except as noted above.   Physical Exam    VS:  BP (!) 100/58 (BP Location: Left Arm, Patient Position: Sitting, Cuff Size: Normal)   Pulse 67   Ht 5\' 4"  (1.626 m)   Wt 140 lb (63.5 kg)   SpO2 (!) 87%   BMI 24.03 kg/m  GEN: Well nourished, well developed, in no acute distress. HEENT: normal. Neck: Supple, no JVD, carotid bruits, or masses. Cardiac: RRR, no murmurs, rubs, or gallops. No clubbing, cyanosis, edema.  Radials/DP/PT 2+ and equal bilaterally.  Respiratory:  Respirations regular and unlabored, clear to auscultation bilaterally. GI: Soft, nontender, nondistended, BS + x 4. MS: no deformity or atrophy. Skin: warm and dry, no rash. Neuro:  Strength and sensation are intact. Psych: Normal affect.  Accessory Clinical Findings    ECG personally reviewed by me today - No EKG in office today.     Lab Results  Component Value Date   WBC 6.2 02/23/2023   HGB 11.6 (L) 02/23/2023   HCT 34.6 (L) 02/23/2023   MCV 86.7 02/23/2023   PLT 147 (L) 02/23/2023   Lab Results  Component Value Date   CREATININE 0.78 02/23/2023   BUN 10 02/23/2023   NA 136 02/23/2023   K 3.8 02/23/2023   CL 100 02/23/2023   CO2 24 02/23/2023   Lab Results  Component Value Date   ALT 8 02/23/2023   AST 19 02/23/2023   ALKPHOS 58 02/23/2023   BILITOT 1.4 (H) 02/23/2023   Lab  Results  Component Value Date   CHOL 113 01/01/2023   HDL 42.20 01/01/2023   LDLCALC 51 01/01/2023   TRIG 102.0 01/01/2023   CHOLHDL 3 01/01/2023    Lab Results  Component Value Date   HGBA1C 5.3 02/27/2022    Assessment & Plan   1. Nonobstructive CAD/chest pain: Cardiac catheterization in February 2014 showed nonobstructive (no significant 50 to 60% RCA stenosis). Recent intermittent chest pain. Coronary CTA is scheduled for 04/02/2023. Discussed ED precautions. Will update BMET in anticipation of CT. Continue aspirin, metoprolol, amlodipine, Imdur, and Crestor.  2. Chronic diastolic heart failure:  Echocardiogram in 01/2022 showed EF 70 to 35%, vigorous LV function, G1 DD. Euvolemic and well compensated on exam.  Continue current medications as above.  3. Carotid artery stenosis:  Most recent carotid ultrasound in 01/2023 showed 1 to 3% B ICA stenosis.  Repeat study recommended in 1 year (01/2024).  Continue aspirin, Crestor.  4. Hypertension: BP well controlled. Continue current antihypertensive regimen.   5. Hyperlipidemia: LDL was 51 in 12/2022.  Continue Crestor.  6.  Bilateral leg pain: She notes an 8-10 month history of bilateral leg pain which she describes as a throbbing/aching. Given history of CAD, carotid artery disease, will check ABIs.  If testing unrevealing, consider statin holiday.   7. Disposition: Follow-up in 4-6 weeks.      Lenna Sciara, NP 03/26/2023, 9:08 PM

## 2023-03-27 DIAGNOSIS — M6281 Muscle weakness (generalized): Secondary | ICD-10-CM | POA: Diagnosis not present

## 2023-03-27 LAB — BASIC METABOLIC PANEL
BUN/Creatinine Ratio: 19 (ref 12–28)
BUN: 18 mg/dL (ref 8–27)
CO2: 25 mmol/L (ref 20–29)
Calcium: 9.1 mg/dL (ref 8.7–10.3)
Chloride: 101 mmol/L (ref 96–106)
Creatinine, Ser: 0.93 mg/dL (ref 0.57–1.00)
Glucose: 103 mg/dL — ABNORMAL HIGH (ref 70–99)
Potassium: 3.9 mmol/L (ref 3.5–5.2)
Sodium: 141 mmol/L (ref 134–144)
eGFR: 63 mL/min/{1.73_m2} (ref >59)

## 2023-03-28 DIAGNOSIS — M6281 Muscle weakness (generalized): Secondary | ICD-10-CM | POA: Diagnosis not present

## 2023-03-31 ENCOUNTER — Telehealth: Payer: Self-pay

## 2023-03-31 DIAGNOSIS — M6281 Muscle weakness (generalized): Secondary | ICD-10-CM | POA: Diagnosis not present

## 2023-03-31 NOTE — Telephone Encounter (Signed)
Left a detailed message for pt. Lab results are stable and pt can proceed with CT Angio.

## 2023-04-01 ENCOUNTER — Telehealth (HOSPITAL_COMMUNITY): Payer: Self-pay | Admitting: Emergency Medicine

## 2023-04-01 NOTE — Telephone Encounter (Signed)
Unable to leave vm Margart Zemanek RN Navigator Cardiac Imaging [facility] [phone] Office  [phone] Cell  

## 2023-04-02 ENCOUNTER — Telehealth: Payer: Self-pay | Admitting: *Deleted

## 2023-04-02 ENCOUNTER — Ambulatory Visit (HOSPITAL_COMMUNITY)
Admission: RE | Admit: 2023-04-02 | Discharge: 2023-04-02 | Disposition: A | Discharge status: Home / Self Care | Payer: Medicare Other | Source: Ambulatory Visit | Attending: Cardiology | Admitting: Cardiology

## 2023-04-02 ENCOUNTER — Ambulatory Visit (HOSPITAL_BASED_OUTPATIENT_CLINIC_OR_DEPARTMENT_OTHER)
Admission: RE | Admit: 2023-04-02 | Discharge: 2023-04-02 | Disposition: A | Discharge status: Home / Self Care | Payer: Medicare Other | Source: Ambulatory Visit | Attending: Cardiology | Admitting: Cardiology

## 2023-04-02 ENCOUNTER — Other Ambulatory Visit: Payer: Self-pay | Admitting: Cardiology

## 2023-04-02 ENCOUNTER — Other Ambulatory Visit: Payer: Self-pay | Admitting: Nurse Practitioner

## 2023-04-02 DIAGNOSIS — R931 Abnormal findings on diagnostic imaging of heart and coronary circulation: Secondary | ICD-10-CM | POA: Insufficient documentation

## 2023-04-02 DIAGNOSIS — R072 Precordial pain: Secondary | ICD-10-CM | POA: Diagnosis not present

## 2023-04-02 DIAGNOSIS — M79661 Pain in right lower leg: Secondary | ICD-10-CM

## 2023-04-02 DIAGNOSIS — I2510 Atherosclerotic heart disease of native coronary artery without angina pectoris: Secondary | ICD-10-CM

## 2023-04-02 DIAGNOSIS — E785 Hyperlipidemia, unspecified: Secondary | ICD-10-CM

## 2023-04-02 MED ORDER — NITROGLYCERIN 0.4 MG SL SUBL
SUBLINGUAL_TABLET | SUBLINGUAL | Status: AC
  Filled 2023-04-02: qty 2

## 2023-04-02 MED ORDER — ROSUVASTATIN CALCIUM 40 MG PO TABS: 40.0000 mg | ORAL_TABLET | Freq: Every evening | ORAL | 3 refills | Status: DC

## 2023-04-02 MED ORDER — IOHEXOL 350 MG/ML SOLN
100.0000 mL | Freq: Once | INTRAVENOUS | Status: AC | PRN
  Administered 2023-04-02: 100 mL via INTRAVENOUS

## 2023-04-02 MED ORDER — NITROGLYCERIN 0.4 MG SL SUBL
0.8000 mg | SUBLINGUAL_TABLET | Freq: Once | SUBLINGUAL | Status: AC
  Administered 2023-04-02: 0.8 mg via SUBLINGUAL

## 2023-04-02 NOTE — Telephone Encounter (Signed)
-----   Message from Lewayne Bunting, MD sent at 04/02/2023  3:13 PM EDT ----- Moderate, nonobstructive CAD and elevated Ca score; increase crestor to 40 mg daily; L/L 8 weeks; make sure she is taking ASA 81 mg daily Olga Millers

## 2023-04-02 NOTE — Telephone Encounter (Signed)
pt aware of results  New script sent to the pharmacy  Lab orders mailed to the pt  

## 2023-04-06 ENCOUNTER — Other Ambulatory Visit: Payer: Self-pay | Admitting: Family Medicine

## 2023-04-07 ENCOUNTER — Telehealth: Payer: Self-pay | Admitting: *Deleted

## 2023-04-07 DIAGNOSIS — Z8709 Personal history of other diseases of the respiratory system: Secondary | ICD-10-CM | POA: Diagnosis not present

## 2023-04-07 DIAGNOSIS — J069 Acute upper respiratory infection, unspecified: Secondary | ICD-10-CM | POA: Diagnosis not present

## 2023-04-07 DIAGNOSIS — R059 Cough, unspecified: Secondary | ICD-10-CM | POA: Diagnosis not present

## 2023-04-07 NOTE — Telephone Encounter (Signed)
-----   Message from Lewayne Bunting, MD sent at 04/07/2023  7:40 AM EDT ----- FU noncontrast chest CT 3 months for adenopathy Olga Millers

## 2023-04-07 NOTE — Telephone Encounter (Signed)
Left message for pt to call.

## 2023-04-08 ENCOUNTER — Ambulatory Visit: Payer: Self-pay

## 2023-04-08 DIAGNOSIS — M6281 Muscle weakness (generalized): Secondary | ICD-10-CM | POA: Diagnosis not present

## 2023-04-08 NOTE — Patient Instructions (Signed)
Visit Information  Thank you for taking time to visit with me today. Please don't hesitate to contact me if I can be of assistance to you.   Following are the goals we discussed today:  -Contact your primary care provider if symptoms worsen or fail to improve -Engage with Engineer, drilling -Contact me as needed   Our next appointment is by telephone on 4/22 at 1:30  Please call the care guide team at [phone] if you need to cancel or reschedule your appointment.   If you are experiencing a Mental Health or Behavioral Health Crisis or need someone to talk to, please call 911  Patient verbalizes understanding of instructions and care plan provided today and agrees to view in MyChart. Active MyChart status and patient understanding of how to access instructions and care plan via MyChart confirmed with patient.     Bevelyn Ngo, BSW, CDP Social Worker, Certified Dementia Practitioner [facility] Care Management  Care Coordination [phone]

## 2023-04-08 NOTE — Patient Outreach (Signed)
  Care Coordination   Initial Visit Note   04/08/2023 Name: AHLANI WICKES MRN: [id number] DOB: 10-17-1946  STARKEISHA VANWINKLE is a 77 y.o. year old female who sees McGowen, Maryjean Morn, MD for primary care. I spoke with  [name] by phone today.  What matters to the patients health and wellness today?  Patient would like transportation resources    Goals Addressed             This Visit's Progress    Care Coordination Activities       Care Coordination Interventions: SDoH screening performed - determined the patient recently stopped driving and would like transportation resources. Patient is currently using her son and friends to provide assistance but indicates she has several future appointments and may need more resources Education provided on [employer]; referral placed via [id number] Determined the patient does not have trouble affording her medications but does have trouble with access - patient currently uses [facility] who delivers her medications but is interested in a pharmacy to assist with acute medication delivery for nights/weekends for necessary medications such as newly prescribed antibiotics Patient reports she went to urgent care on 4/15 and had to wait to receive antibiotics until the afternoon of 4/16, she would like these sooner if possible in the future Advised the patient SW would collaborate with colleagues to determine if this is available; collaboration with [facility] Pharmacy Manager to inquire if acute pharmacy delivery is available in our area Educated the patient on the role of the care coordination team, scheduled patient to speak with RN Care Manager Dionne Leath on 5/1 Discussed plan for SW to follow up with the patient over the next week to check on her Encouraged the patient to contact her primary care provider if her symptoms do not improve, patient stated understanding Collaboration with RN Care Manager to advise of  interventions and plan         SDOH assessments and interventions completed:  Yes  SDOH Interventions Today    Flowsheet Row Most Recent Value  SDOH Interventions   Food Insecurity Interventions Intervention Not Indicated  Housing Interventions Intervention Not Indicated  Transportation Interventions Other (Comment), [id number] Referral  [Recently stopped driving, relies on friends/family. Referral to [employer]]  Utilities Interventions Intervention Not Indicated        Care Coordination Interventions:  Yes, provided   Interventions Today    Flowsheet Row Most Recent Value  Chronic Disease   Chronic disease during today's visit Hypertension (HTN), Congestive Heart Failure (CHF), Chronic Obstructive Pulmonary Disease (COPD)  General Interventions   General Interventions Discussed/Reviewed General Interventions Discussed, Programmer, applications, Doctor Visits, Referral to Nurse, Communication with  Doctor Visits Discussed/Reviewed Doctor Visits Reviewed  Communication with Pharmacists  Education Interventions   Education Provided Provided Education  Provided Verbal Education On Community Resources        Follow up plan: Follow up call scheduled for 4/22    Encounter Outcome:  Pt. Visit Completed   Bevelyn Ngo, [name], CDP Social Worker, Certified Dementia Practitioner [facility] Care Management  Care Coordination [phone]

## 2023-04-09 ENCOUNTER — Encounter: Payer: Medicare Other | Attending: Physical Medicine & Rehabilitation | Admitting: Physical Medicine & Rehabilitation

## 2023-04-09 ENCOUNTER — Encounter: Payer: Self-pay | Admitting: Physical Medicine & Rehabilitation

## 2023-04-09 VITALS — BP 159/87 | HR 75 | Ht 64.0 in | Wt 137.0 lb

## 2023-04-09 DIAGNOSIS — G8929 Other chronic pain: Secondary | ICD-10-CM

## 2023-04-09 DIAGNOSIS — M25561 Pain in right knee: Secondary | ICD-10-CM

## 2023-04-09 DIAGNOSIS — M1711 Unilateral primary osteoarthritis, right knee: Secondary | ICD-10-CM | POA: Diagnosis not present

## 2023-04-09 DIAGNOSIS — M1712 Unilateral primary osteoarthritis, left knee: Secondary | ICD-10-CM

## 2023-04-09 DIAGNOSIS — M25562 Pain in left knee: Secondary | ICD-10-CM | POA: Diagnosis not present

## 2023-04-09 MED ORDER — OXYCODONE-ACETAMINOPHEN 10-325 MG PO TABS: 1.0000 | ORAL_TABLET | Freq: Four times a day (QID) | ORAL | 0 refills | Status: DC | PRN

## 2023-04-09 MED ORDER — TRIAMCINOLONE ACETONIDE 32 MG IX SRER
64.0000 mg | Freq: Once | INTRA_ARTICULAR | Status: AC
  Administered 2023-04-09: 64 mg via INTRA_ARTICULAR

## 2023-04-09 NOTE — Patient Instructions (Signed)
ALWAYS FEEL FREE TO CALL OUR OFFICE WITH ANY PROBLEMS OR QUESTIONS ([phone])  **PLEASE NOTE** ALL MEDICATION REFILL REQUESTS (INCLUDING CONTROLLED SUBSTANCES) NEED TO BE MADE AT LEAST 7 DAYS PRIOR TO REFILL BEING DUE. ANY REFILL REQUESTS INSIDE THAT TIME FRAME MAY RESULT IN DELAYS IN RECEIVING YOUR PRESCRIPTION.                    

## 2023-04-09 NOTE — Progress Notes (Signed)
PROCEDURE NOTE  DIAGNOSIS:  OA bilateral knees  INTERVENTION:   ZILRETTA INJECTION x 2.     After informed consent and preparation of the skin with betadine and isopropyl alcohol, I injected 32MG  of zilretta dissolved into 5cc of diluent into each knee  via anterolateral approach. Contents of syring were shaken vigorously and aspiration was performed prior to injection. The patient tolerated well, and no complications were encountered. Afterward the area was cleaned and dressed. Post- injection instructions were provided including ice  if swelling or pain should occur.   She has an appt to see ortho next month.    Ranelle Oyster, MD, [facility] Physical Medicine & Rehabilitation 04/09/2023

## 2023-04-10 ENCOUNTER — Telehealth: Payer: Self-pay | Admitting: Family Medicine

## 2023-04-10 DIAGNOSIS — M6281 Muscle weakness (generalized): Secondary | ICD-10-CM | POA: Diagnosis not present

## 2023-04-10 MED ORDER — DULOXETINE HCL 60 MG PO CPEP: 60.0000 mg | ORAL_CAPSULE | Freq: Every day | ORAL | 1 refills | Status: DC

## 2023-04-10 NOTE — Telephone Encounter (Signed)
Pt advised refill sent. °

## 2023-04-10 NOTE — Telephone Encounter (Signed)
Pt is requesting a partial refill on Cymbalta until her appt on 5/9. She uses [facility]

## 2023-04-10 NOTE — Telephone Encounter (Signed)
Duloxetine prescription sent

## 2023-04-10 NOTE — Telephone Encounter (Signed)
Pt has scheduled an appointment 5/9 and is asking if she can have a temp fill of DULoxetine (CYMBALTA) 60 MG capsule  until her appointment. Her Pharmacy is [facility] in [place]

## 2023-04-14 ENCOUNTER — Telehealth: Payer: Self-pay

## 2023-04-14 NOTE — Patient Outreach (Signed)
  Care Coordination   04/14/2023 Name: Linda [name] MRN: [id number] DOB: 08-06-1946   Care Coordination Outreach Attempts:  An unsuccessful telephone outreach was attempted for a scheduled appointment today.  Follow Up Plan:  Additional outreach attempts will be made to offer the patient care coordination information and services.   Encounter Outcome:  No Answer   Care Coordination Interventions:  Yes, provided    Bevelyn Ngo, BSW, CDP Social Worker, Certified Dementia Practitioner [facility] Care Management  Care Coordination [phone]

## 2023-04-14 NOTE — Telephone Encounter (Signed)
Left message for patient that we will repeat CT scan in 3 months to call with questions.

## 2023-04-15 DIAGNOSIS — M6281 Muscle weakness (generalized): Secondary | ICD-10-CM | POA: Diagnosis not present

## 2023-04-16 ENCOUNTER — Telehealth: Payer: Self-pay | Admitting: *Deleted

## 2023-04-16 MED ORDER — OXYCODONE-ACETAMINOPHEN 10-325 MG PO TABS: 1.0000 | ORAL_TABLET | Freq: Four times a day (QID) | ORAL | 0 refills | Status: DC | PRN

## 2023-04-16 NOTE — Telephone Encounter (Signed)
[facility] does not have her oxycodone 10-325 but she has found it at the [facility] on [address].Please send in a new RX.

## 2023-04-16 NOTE — Telephone Encounter (Signed)
Rx written and sent to the pharmacy. Thanks!  

## 2023-04-17 ENCOUNTER — Ambulatory Visit (HOSPITAL_COMMUNITY)
Admission: RE | Admit: 2023-04-17 | Discharge: 2023-04-17 | Disposition: A | Discharge status: Home / Self Care | Payer: Medicare Other | Source: Ambulatory Visit | Attending: Nurse Practitioner | Admitting: Nurse Practitioner

## 2023-04-17 DIAGNOSIS — M79661 Pain in right lower leg: Secondary | ICD-10-CM | POA: Diagnosis not present

## 2023-04-17 DIAGNOSIS — M79662 Pain in left lower leg: Secondary | ICD-10-CM | POA: Diagnosis not present

## 2023-04-17 DIAGNOSIS — M6281 Muscle weakness (generalized): Secondary | ICD-10-CM | POA: Diagnosis not present

## 2023-04-18 ENCOUNTER — Telehealth: Payer: Self-pay

## 2023-04-18 LAB — VAS US ABI WITH/WO TBI
Left ABI: 1.26
Right ABI: 1.28

## 2023-04-18 NOTE — Patient Outreach (Signed)
  Care Coordination   04/18/2023 Name: Linda [name] MRN: [id number] DOB: 19-Apr-1946   Care Coordination Outreach Attempts:  A second unsuccessful outreach was attempted today to offer the patient with information about available care coordination services as a benefit of their health plan.     Follow Up Plan:  Additional outreach attempts will be made to offer the patient care coordination information and services.   Encounter Outcome:  No Answer   Care Coordination Interventions:  No, not indicated    Bevelyn Ngo, BSW, CDP Social Worker, Certified Dementia Practitioner [facility] Care Management  Care Coordination [phone]

## 2023-04-21 DIAGNOSIS — M6281 Muscle weakness (generalized): Secondary | ICD-10-CM | POA: Diagnosis not present

## 2023-04-22 ENCOUNTER — Ambulatory Visit: Payer: Self-pay

## 2023-04-22 NOTE — Patient Instructions (Signed)
Visit Information  Thank you for taking time to visit with me today. Please don't hesitate to contact me if I can be of assistance to you.   Following are the goals we discussed today:  -Engage with [employer] to establish transportation services -Engage with Medical illustrator as scheduled on 5/1 at 11:30  -Contact your primary care provider as needed  Your next appointment is by telephone on 5/1 at 11:30 with RN Care Manager Fleeta Emmer  Please call the care guide team at [phone] if you need to cancel or reschedule your appointment.   If you are experiencing a Mental Health or Behavioral Health Crisis or need someone to talk to, please go to [facility] [address] [place] [phone])  Patient verbalizes understanding of instructions and care plan provided today and agrees to view in MyChart. Active MyChart status and patient understanding of how to access instructions and care plan via MyChart confirmed with patient.     Bevelyn Ngo, BSW, CDP Social Worker, Certified Dementia Practitioner [facility] Care Management  Care Coordination [phone]

## 2023-04-22 NOTE — Patient Outreach (Signed)
  Care Coordination   Follow Up Visit Note   04/22/2023 Name: Linda [name] MRN: [id number] DOB: 04/25/1946  Linda [name] is a 77 y.o. year old female who sees McGowen, Maryjean Morn, MD for primary care. I spoke with  [name] by phone today.  What matters to the patients health and wellness today?  Patient is doing well today    Goals Addressed             This Visit's Progress    COMPLETED: Care Coordination Activities       Care Coordination Interventions: Collaboration with pharmacy team who advises there are no local pharmacies to deliver acute medications on nights or weekends Advised the patient there are no acute pharmacies, patient to remain using current pharmacy that does deliver M-F Performed chart review to note patients referral to [employer] was accepted via [id number] Assessed for acute SW needs - no needs identified at this time Encouraged the patient to contact her primary care provider as needed        SDOH assessments and interventions completed:  No     Care Coordination Interventions:  Yes, provided  Interventions Today    Flowsheet Row Most Recent Value  Chronic Disease   Chronic disease during today's visit Hypertension (HTN), Congestive Heart Failure (CHF), Chronic Obstructive Pulmonary Disease (COPD)  General Interventions   General Interventions Discussed/Reviewed General Interventions Reviewed, Community Resources        Follow up plan: No further intervention required.   Encounter Outcome:  Pt. Visit Completed   Bevelyn Ngo, BSW, CDP Social Worker, Certified Dementia Practitioner [facility] Care Management  Care Coordination [phone]

## 2023-04-23 ENCOUNTER — Ambulatory Visit: Payer: Self-pay

## 2023-04-23 NOTE — Patient Instructions (Signed)
Visit Information  Thank you for taking time to visit with me today. Please don't hesitate to contact me if I can be of assistance to you.   Following are the goals we discussed today:   Goals Addressed             This Visit's Progress    Managing Chronic Health Issues       Care Coordination Interventions: Reviewed medications with patient and discussed pain control Discussed plans with patient for ongoing care management follow up and provided patient with direct contact information for care management team Provided education on importance of blood pressure control in management of CAD Provided education on Importance of limiting foods high in cholesterol Reviewed Importance of attending all scheduled provider appointments Screening for signs and symptoms of depression related to chronic disease state Reviewed scheduled/upcoming provider appointments including: Follow up cardiology appointment 04/30/23 Emotional Support Provided        Our next appointment is by telephone on 06/03/23 at 1100  Please call the care guide team at [phone] if you need to cancel or reschedule your appointment.   If you are experiencing a Mental Health or Behavioral Health Crisis or need someone to talk to, please call the Suicide and Crisis Lifeline: 988   Patient verbalizes understanding of instructions and care plan provided today and agrees to view in MyChart. Active MyChart status and patient understanding of how to access instructions and care plan via MyChart confirmed with patient.     The patient has been provided with contact information for the care management team and has been advised to call with any health related questions or concerns.   Bary Leriche, RN, MSN [facility] Care Management Care Management Coordinator Direct Line [phone]

## 2023-04-23 NOTE — Patient Outreach (Addendum)
  Care Coordination   Initial Visit Note   04/23/2023 Name: Linda [name] MRN: [id number] DOB: 03/25/1946  Linda [name] is a 77 y.o. year old female who sees McGowen, Maryjean Morn, MD for primary care. I spoke with  [name] by phone today.  What matters to the patients health and wellness today?  Managing health    Goals Addressed             This Visit's Progress    Managing Chronic Health Issues       Care Coordination Interventions: Reviewed medications with patient and discussed pain control Discussed plans with patient for ongoing care management follow up and provided patient with direct contact information for care management team Provided education on importance of blood pressure control in management of CAD Provided education on Importance of limiting foods high in cholesterol Reviewed Importance of attending all scheduled provider appointments Screening for signs and symptoms of depression related to chronic disease state Reviewed scheduled/upcoming provider appointments including: Follow up cardiology appointment 04/30/23 Emotional Support Provided  Patient lives at [facility].  Patient able to manage meds and ADL's  However she does have back and knee issues.  Recent knee injections that have helped her get around better.  Discussed pain control and importance .         SDOH assessments and interventions completed:  Yes  SDOH Interventions Today    Flowsheet Row Most Recent Value  SDOH Interventions   Housing Interventions Intervention Not Indicated  Transportation Interventions Intervention Not Indicated  Depression Interventions/Treatment  Currently on Treatment        Care Coordination Interventions:  Yes, provided   Follow up plan: Follow up call scheduled for June    Encounter Outcome:  Pt. Visit Completed   Bary Leriche, RN, MSN [facility] Care Management Care Management Coordinator Direct Line [phone]

## 2023-04-24 ENCOUNTER — Telehealth: Payer: Self-pay

## 2023-04-24 DIAGNOSIS — M6281 Muscle weakness (generalized): Secondary | ICD-10-CM | POA: Diagnosis not present

## 2023-04-24 NOTE — Telephone Encounter (Signed)
Lmom to discuss VAS [place] ABI results. Pt does have an apt next week and results can be discussed further.

## 2023-04-30 ENCOUNTER — Telehealth: Payer: Self-pay | Admitting: Nurse Practitioner

## 2023-04-30 ENCOUNTER — Encounter: Payer: Self-pay | Admitting: Nurse Practitioner

## 2023-04-30 ENCOUNTER — Ambulatory Visit: Payer: Medicare Other | Attending: Nurse Practitioner | Admitting: Nurse Practitioner

## 2023-04-30 VITALS — BP 124/74 | HR 71 | Ht 64.0 in | Wt 140.2 lb

## 2023-04-30 DIAGNOSIS — M79661 Pain in right lower leg: Secondary | ICD-10-CM | POA: Diagnosis not present

## 2023-04-30 DIAGNOSIS — I5032 Chronic diastolic (congestive) heart failure: Secondary | ICD-10-CM | POA: Diagnosis not present

## 2023-04-30 DIAGNOSIS — I2510 Atherosclerotic heart disease of native coronary artery without angina pectoris: Secondary | ICD-10-CM | POA: Insufficient documentation

## 2023-04-30 DIAGNOSIS — I10 Essential (primary) hypertension: Secondary | ICD-10-CM | POA: Insufficient documentation

## 2023-04-30 DIAGNOSIS — I6523 Occlusion and stenosis of bilateral carotid arteries: Secondary | ICD-10-CM | POA: Diagnosis not present

## 2023-04-30 DIAGNOSIS — E785 Hyperlipidemia, unspecified: Secondary | ICD-10-CM | POA: Diagnosis not present

## 2023-04-30 DIAGNOSIS — M79662 Pain in left lower leg: Secondary | ICD-10-CM | POA: Diagnosis not present

## 2023-04-30 MED ORDER — ISOSORBIDE MONONITRATE ER 30 MG PO TB24: 60.0000 mg | ORAL_TABLET | Freq: Two times a day (BID) | ORAL | 2 refills | Status: DC

## 2023-04-30 NOTE — Progress Notes (Signed)
Office Visit    Patient Name: Linda [name] Date of Encounter: 04/30/2023  Primary Care Provider:  Jeoffrey Massed, MD Primary Cardiologist:  Olga Millers, MD  Chief Complaint    77 year old female with a history of nonobstructive CAD, chronic chest pain, chronic diastolic heart failure, carotid artery stenosis, hypertension, and hyperlipidemia who presents for follow-up related to CAD and chest pain.   Past Medical History    Past Medical History:  Diagnosis Date   Acute upper GI bleed 06/2014   while pt taking coumadin, plavix, and meloxicam---despite being told not to take coumadin.   Anginal pain (HCC)    Nonobstructive CAD 2014; however, her cardiologist put her on a statin for this and NOT for hyperlipidemia per pt report.  Atyp CP 08/2017 at card f/u, plan for myoc perf imaging.   Anxiety    panic attacks   Asthma    w/ asbestososis    BPPV (benign paroxysmal positional vertigo) 12/16/2012   Chronic diastolic CHF (congestive heart failure) (HCC)    dry wt as of 11/06/16 is 168 lbs.   Chronic lower back pain    COPD (chronic obstructive pulmonary disease) (HCC)    DDD (degenerative disc disease)    lumbar and cervical.    Diverticular disease    Fibromyalgia    Patient states dx was around her late 51s but she had sx's for years prior to this.   H/O hiatal hernia    History of pneumonia    hospitalized 12/2011, 02/2013, and 07/2013 [facility]) for this   HTN (hypertension)    Renal artery dopplers 04/2013 neg for stenosis.   Hypervitaminosis D 09/27/2019   over-supplemented.  Stopped vit D and plan recheck 2 mo.   Idiopathic angio-edema-urticaria 46962; 2021   Angioedema component was very minimal.  2021->Dr. Bobbitt (allergist) eval.   Insomnia    Iron deficiency anemia    Hematologist in [place], [place] did extensive w/u; no cause found; failed oral supplement;; gets fairly regular (q40m or so) IV iron infusions (Venofer -iron sucrose- 200mg  with procrit.  "for 14  yr I've been getting blood work q month & getting infusions prn" (07/12/2013).  Dr. Myna Hidalgo locally, iron infusions done, EPO deficiency dx'd   Kidney stone    Migraine syndrome    "not as often anymore; used to be ~ q wk" (07/12/2013)   Mixed incontinence urge and stress    Nephrolithiasis    "passed all on my own or they are still in there" (07/12/2013)   Neuroleptic induced parkinsonism (HCC) 2018   Dr. Arbutus Leas, neuro, saw her 11/24/17 and recommended d/c of abilify as first step.  D/c'd abilify and pt got complete recovery.   Oropharyngeal dysphagia    swallowing study speech path 05/2020. Gastric bx's showed gastritis, h pylori NEG   OSA on CPAP    prior to move to [place]--had another sleep study 10/2015 w/pulm Dr. Su Monks.   Osteoarthritis    "severe; progressing fast" (07/12/2013); multiple joints-not surgical candidate for TKR (03/2015).  Triamcinolon knee injections by Dr. Hermelinda Medicus 12/2017.   Pernicious anemia 08/24/2014   Pleural plaque with presence of asbestos 07/22/2013   Pulmonary embolism (HCC) 07/2013   Dx at [facility] with very small peripheral upper lobe pe 07/2013: pt took coumadin for about 8-9 mo   Pyelonephritis    "several times over the last 30 yr" (07/12/2013)   RBBB (right bundle branch block)    Recurrent major depression (HCC)    Recurrent UTI  hx of hospitalization for pyelonephritis; started abx prophylaxis 06/2015   Syncope    Hypotensive; ED visit--Dr. Sharyn Lull did Cath--nonobstructive CAD, EF 55-60%.  In retrospect, suspect pt rx med misuse/polypharmacy   Vertebral compression fracture (HCC)    Acute T12 on 02/27/22 (fall).  multiple old thoracic-->neurosurg to do MRI as of 05/2022   Past Surgical History:  Procedure Laterality Date   APPENDECTOMY  1960   AXILLARY SURGERY Left 1978   Multiple "lump" in armpit per pt   BIOPSY  06/17/2020   Gastric bx->gastritis, h pylori neg.  Procedure: BIOPSY;  Surgeon: Jeani Hawking, MD;  Location: [facility];  Service: Endoscopy;;    CARDIAC CATHETERIZATION  01/2013   nonobstructive CAD, EF 55-60%   CARDIOVASCULAR STRESS TEST  02/22/2015   Low risk myocard perf imaging; wall motion normal, normal EF   CARDIOVASCULAR STRESS TEST     09/2017 myo perf low risk   carotid duplex doppler  10/21/2017   R vertebral flow suggestive of possible distal obstruction.  Pt declined further w/u as of 10/29/17 but need to revisit this problem periodically.   COCCYX REMOVAL  1972   DEXA  06/05/2017   T-score -3.1   DILATION AND CURETTAGE OF UTERUS  ? 1970's   ESOPHAGOGASTRODUODENOSCOPY N/A 07/19/2014   Gastritis found + in the setting of supratherapeutic INR, +plavix, + meloxicam.   ESOPHAGOGASTRODUODENOSCOPY (EGD) WITH PROPOFOL N/A 06/17/2020   NO stricture or other prob to explain pt's dysphagia, dilation was done anyway.  Gastric bx's-->gastritis, h pylori neg. Procedure: ESOPHAGOGASTRODUODENOSCOPY (EGD) WITH PROPOFOL;  Surgeon: Jeani Hawking, MD;  Location: [facility];  Service: Endoscopy;  Laterality: N/A;   EYE SURGERY Left 2012-2013   "injections for ~ 1 yr; don't really know what for" (07/12/2013)   HEEL SPUR SURGERY Left 2008   kidney stone removal Right    KNEE SURGERY  2005   LEFT HEART CATHETERIZATION WITH CORONARY ANGIOGRAM N/A 01/30/2013   Procedure: LEFT HEART CATHETERIZATION WITH CORONARY ANGIOGRAM;  Surgeon: Robynn Pane, MD;  Location: [facility];  Service: Cardiovascular;  Laterality: N/A;   MALONEY DILATION  06/17/2020   Procedure: Elease Hashimoto DILATION;  Surgeon: Jeani Hawking, MD;  Location: [facility];  Service: Endoscopy;;   PLANTAR FASCIA RELEASE Left 2008   SPIROMETRY  04/25/2014   In hosp for acute asthma/COPD flare: mixed obstructive and restrictive lung disease. The FEV1 is severely reduced at 45% predicted.  FEV1 signif decreased compared to prior spirometry 07/23/13.   TENDON RELEASE  1996   Right forearm and hand   TOTAL ABDOMINAL HYSTERECTOMY  1974   TRANSTHORACIC ECHOCARDIOGRAM  01/2013;  04/2014;08/2015; 09/2017   2014--NORMAL.  2015--focal basal septal hypertrophy, EF 55-60%, grade I diast dysfxn, mild LAE.  08/2015 EF 55-60%, nl LV syst fxn, grade I DD, valves wnl. 10/21/17: EF 65-70%, grd I DD, o/w normal. 02/17/22 EF 70-75%, hyperdynamic LV fxn, grd I DD.    Allergies  Allergies  Allergen Reactions   Aripiprazole Other (See Comments) and Rash    parkinsonism Other reaction(s): Other parkinsonism  parkinsonism parkinsonism   Penicillins Itching, Swelling, Rash and Shortness Of Breath    Tolerated Cefepime in ED. Has patient had a PCN reaction causing immediate rash, facial/tongue/throat swelling, SOB or lightheadedness with hypotension: Yes Has patient had a PCN reaction causing severe rash involving mucus membranes or skin necrosis: No Has patient had a PCN reaction that required hospitalization: No  Has patient had a PCN reaction occurring within the last 10 years: No  Tolerated Cefepime in ED. Has patient had a PCN reaction causing immediate rash, facial/tongue/throat swelling, SOB or lightheadedness with hypotension: Yes Has patient had a PCN reaction causing severe rash involving mucus membranes or skin necrosis: No Has patient had a PCN reaction that required hospitalization: No  Has patient had a PCN reaction occurring within the last 10 years: No Rash,swelling Tolerated Cefepime in ED.  Rash,swelling  Tolerated Cefepime in ED.  Tolerated Cefepime in ED. Has patient had a PCN reaction causing immediate rash, facial/tongue/throat swelling, SOB or lightheadedness with hypotension: Yes Has patient had a PCN reaction causing severe rash involving mucus membranes or skin necrosis: No Has patient had a PCN reaction that required hospitalization: No  Has patient had a PCN reaction occurring within the last 10 years: No   Bactrim [Sulfamethoxazole-Trimethoprim] Rash     Labs/Other Studies Reviewed    The following studies were reviewed today: Carotid ultrasound  01/2023: Summary:  Right Carotid: Velocities in the right ICA are consistent with a 1-39%  stenosis.   Left Carotid: Velocities in the left ICA are consistent with a 1-39%  stenosis.   Vertebrals: Left vertebral artery demonstrates antegrade flow. Right  vertebral              artery demonstrates high resistant flow.  Subclavians: Normal flow hemodynamics were seen in bilateral subclavian               arteries.   *See table(s) above for measurements and observations.  Suggest follow up study in 12 months.    Echo 01/2022: IMPRESSIONS     1. Left ventricular ejection fraction, by estimation, is 70 to 75%. The  left ventricle has hyperdynamic function. The left ventricle has no  regional wall motion abnormalities. Left ventricular diastolic parameters  are consistent with Grade I diastolic  dysfunction (impaired relaxation).   2. Right ventricular systolic function is normal. The right ventricular  size is normal. Tricuspid regurgitation signal is inadequate for assessing  PA pressure.   3. The mitral valve is normal in structure. Trivial mitral valve  regurgitation. No evidence of mitral stenosis.   4. The aortic valve is tricuspid. Aortic valve regurgitation is not  visualized. Aortic valve sclerosis is present, with no evidence of aortic  valve stenosis.   5. The inferior vena cava is normal in size with greater than 50%  respiratory variability, suggesting right atrial pressure of 3 mmHg.    CCTA 03/2023:   IMPRESSION: 1. Coronary calcium score of 1315. This was 95th percentile for age and sex matched control.   2. Total plaque volume 865mm3 which is 82nd percentile for age- and sex-matched controls (calcified plaque 288mm3; noncalcified plaque 535mm3). TPV is severe   3.  Normal coronary origin with right dominance.   4. Mixed plaque in proximal LCX causes moderate (50-69%) stenosis with high risk plaque features including positive remodeling and napkin ring sign.  Calcified plaque in ostial OM1 causes mild (25-49%) stenosis   5. Mixed plaque in proximal LAD causes mild (25-49%) stenosis. Mixed plaque in mid LAD causes moderate (50-69%) stenosis   6. Mixed plaque in proximal and mid RCA causes mild (25-49%) stenosis.   7.  Will send study for CTFFR   CAD-RADS 3. Moderate stenosis. Consider symptom-guided anti-ischemic pharmacotherapy as well as risk factor modification per guideline directed care. Additional analysis with CT FFR will be submitted. EXAM: FFRCT ANALYSIS   FINDINGS: FFRct analysis was performed on the original cardiac CT angiogram dataset. Diagrammatic representation  of the FFRct analysis is provided in a separate PDF document in PACS. This dictation was created using the PDF document and an interactive 3D model of the results. 3D model is not available in the EMR/PACS. Normal FFR range is >0.80.   1. Left Main: No significant stenosis   2. LAD: CTFFR 0.81 across lesion in mid LAD, suggesting lesion is not functionally significant   3. LCX: CTFFR 0.87 across lesion in proximal LCX, suggesting lesion is not functionally significant. CTFFR 0.93 across lesion in proximal OM1, suggesting no functional significance.   4. RCA: CTFFR 0.83 across lesion in mid RCA, suggesting lesion is not functionally significant   IMPRESSION: 1.  CTFFR suggests nonobstructive CAD  ABIs 03/2023:   Summary:  Right: Resting right ankle-brachial index is within normal range. The  right toe-brachial index is normal.   Left: Resting left ankle-brachial index is within normal range. The left  toe-brachial index is normal.    *See table(s) above for measurements and observations.     Recent Labs: 02/23/2023: ALT 8; Hemoglobin 11.6; Platelets 147 03/26/2023: BUN 18; Creatinine, Ser 0.93; Potassium 3.9; Sodium 141  Recent Lipid Panel    Component Value Date/Time   CHOL 113 01/01/2023 1336   TRIG 102.0 01/01/2023 1336   HDL 42.20 01/01/2023  1336   CHOLHDL 3 01/01/2023 1336   VLDL 20.4 01/01/2023 1336   LDLCALC 51 01/01/2023 1336    History of Present Illness    77 year old female with the above past medical history including nonobstructive CAD, chronic chest pain, chronic diastolic heart failure, carotid artery stenosis, hypertension, and hyperlipidemia.    Prior cardiac catheterization in February 2014 showed nonobstructive (no significant 50 to 60% RCA stenosis), normal LV function, note of 4+ MR.  Renal Dopplers are evidence of stenosis.  She has a history of presumed orthostatic syncope.  She also has had problems with diastolic heart failure.  Nuclear study in October 2018 showed EF 81%, probable shifting breast attenuation, however, very mild distal anteroseptal ischemia could not be excluded. She was treated medically.  Carotid Dopplers in February 2023 showed 40 to 59% R ICA stenosis, 1 to 39% LICA stenosis.  Echocardiogram in 01/2022 showed EF 70 to 35%, vigorous LV function, G1 DD.  At her follow-up visit in 12/2022 she noted ongoing complaints of substernal nonexertional, nonradiating chest pain as well as dyspnea on exertion. Repeat carotid ultrasound in 01/2023 showed 1 to 3% B ICA stenosis.  Repeat study was recommended in 1 year. Coronary CT angiogram revealed coronary calcium score of 1315, 95th percentile, nonobstructive CAD.  Crestor was increased to 40 mg daily.  She was last in the office on 03/26/2023 and was stable overall from a cardiac standpoint.  She denied any recurrent chest pain.  She did note bilateral leg pain which she described as a throbbing/aching. ABIs were unremarkable.  It was noted that should she have ongoing leg discomfort, statin holiday should be considered.    She presents today for follow-up.  Since her last visit she has been stable overall from a cardiac standpoint.  She notes ongoing dyspnea on exertion, intermittent chest discomfort.  She denies palpitations, edema, PND, orthopnea, weight gain.   She continues to note bilateral leg pain.   Home Medications    Current Outpatient Medications  Medication Sig Dispense Refill   albuterol (2.5 MG/3ML) 0.083% NEBU 3 mL, albuterol (5 MG/ML) 0.5% NEBU 0.5 mL Take 5 mg by nebulization. PRN     albuterol (VENTOLIN HFA)  108 (90 Base) MCG/ACT inhaler Inhale 1-2 puffs into the lungs every 6 (six) hours as needed for wheezing or shortness of breath. 18 g 5   ALPRAZolam (XANAX) 0.5 MG tablet TAKE ONE TABLET BY MOUTH THREE TIMES DAILY 90 tablet 5   amLODipine (NORVASC) 5 MG tablet TAKE ONE TABLET BY MOUTH DAILY 90 tablet 3   aspirin 81 MG tablet Take 81 mg by mouth at bedtime.     cyclobenzaprine (FLEXERIL) 5 MG tablet Take 5 mg by mouth 2 (two) times daily as needed.     diclofenac Sodium (VOLTAREN) 1 % GEL Apply 2 g topically 4 (four) times daily. 4 times a day to both knees 1000 each 4   DULoxetine (CYMBALTA) 60 MG capsule Take 1 capsule (60 mg total) by mouth daily. 90 capsule 1   EPINEPHrine 0.3 mg/0.3 mL IJ SOAJ injection Inject 0.3 mg into the muscle as needed for anaphylaxis. 1 each 1   ipratropium (ATROVENT) 0.03 % nasal spray Place 2 sprays into both nostrils every 12 (twelve) hours as needed (allergies). 30 mL 5   lamoTRIgine (LAMICTAL) 200 MG tablet Take 1 tablet (200 mg total) by mouth daily. 90 tablet 1   methocarbamol (ROBAXIN) 500 MG tablet Take 1 tablet (500 mg total) by mouth every 6 (six) hours as needed for muscle spasms. 80 tablet 2   metoprolol succinate (TOPROL-XL) 100 MG 24 hr tablet take ONE tab daily, take additional 1/2 tab as needed FOR FOR BLOOD PRESSURE > 160/90 135 tablet 1   metoprolol tartrate (LOPRESSOR) 50 MG tablet Take 2 hours prior to CT scan 1 tablet 0   Misc Natural Products (NEURIVA PO) Take by mouth.     Multiple Vitamin (MULTIVITAMIN WITH MINERALS) TABS tablet Take 1 tablet by mouth daily.     nitroGLYCERIN (NITROSTAT) 0.4 MG SL tablet Place 1 tablet (0.4 mg total) under the tongue every 5 (five) minutes as  needed for chest pain (x 3 doses). 25 tablet 11   oxyCODONE-acetaminophen (PERCOCET) 10-325 MG tablet Take 1 tablet by mouth every 6 (six) hours as needed for pain. 120 tablet 0   pantoprazole (PROTONIX) 40 MG tablet Take 1 tablet (40 mg total) by mouth 2 (two) times daily. 180 tablet 1   rOPINIRole (REQUIP) 0.5 MG tablet TAKE ONE TABLET BY MOUTH AT BEDTIME 90 tablet 1   rosuvastatin (CRESTOR) 40 MG tablet Take 1 tablet (40 mg total) by mouth every evening. 90 tablet 3   solifenacin (VESICARE) 10 MG tablet TAKE 1 TABLET ONCE A DAY. 30 tablet 0   traZODone (DESYREL) 50 MG tablet take 2-4 tablets at bedtime as needed for insomnia. 120 tablet 5   cefdinir (OMNICEF) 300 MG capsule Take 1 capsule (300 mg total) by mouth 2 (two) times daily. (Patient not taking: Reported on 04/23/2023) 14 capsule 0   donepezil (ARICEPT) 10 MG tablet Take 1 tablet (10 mg total) by mouth at bedtime. (Patient not taking: Reported on 04/23/2023) 90 tablet 1   isosorbide mononitrate (IMDUR) 30 MG 24 hr tablet Take 2 tablets (60 mg total) by mouth in the morning and at bedtime. 270 tablet 2   No current facility-administered medications for this visit.     Review of Systems    She denies palpitations, pnd, orthopnea, n, v, dizziness, syncope, edema, weight gain, or early satiety. All other systems reviewed and are otherwise negative except as noted above.   Physical Exam    VS:  BP 124/74   Pulse 71  Ht 5\' 4"  (1.626 m)   Wt 140 lb 3.2 oz (63.6 kg)   SpO2 96%   BMI 24.07 kg/m   GEN: Well nourished, well developed, in no acute distress. HEENT: normal. Neck: Supple, no JVD, carotid bruits, or masses. Cardiac: RRR, no murmurs, rubs, or gallops. No clubbing, cyanosis, edema.  Radials/DP/PT 2+ and equal bilaterally.  Respiratory:  Respirations regular and unlabored, clear to auscultation bilaterally. GI: Soft, nontender, nondistended, BS + x 4. MS: no deformity or atrophy. Skin: warm and dry, no rash. Neuro:  Strength  and sensation are intact. Psych: Normal affect.  Accessory Clinical Findings    ECG personally reviewed by me today - No EKG in office today.     Lab Results  Component Value Date   WBC 6.2 02/23/2023   HGB 11.6 (L) 02/23/2023   HCT 34.6 (L) 02/23/2023   MCV 86.7 02/23/2023   PLT 147 (L) 02/23/2023   Lab Results  Component Value Date   CREATININE 0.93 03/26/2023   BUN 18 03/26/2023   NA 141 03/26/2023   K 3.9 03/26/2023   CL 101 03/26/2023   CO2 25 03/26/2023   Lab Results  Component Value Date   ALT 8 02/23/2023   AST 19 02/23/2023   ALKPHOS 58 02/23/2023   BILITOT 1.4 (H) 02/23/2023   Lab Results  Component Value Date   CHOL 113 01/01/2023   HDL 42.20 01/01/2023   LDLCALC 51 01/01/2023   TRIG 102.0 01/01/2023   CHOLHDL 3 01/01/2023    Lab Results  Component Value Date   HGBA1C 5.3 02/27/2022    Assessment & Plan    1. Nonobstructive CAD/chest pain: Cardiac catheterization in February 2014 showed nonobstructive (no significant 50 to 60% RCA stenosis). Coronary CT angiogram in  03/2023 revealed coronary calcium score of 1315, 95th percentile, nonobstructive CAD.  Crestor was increased to 40 mg daily.  She continues to note intermittent chest pain, stable dyspnea on exertion.  Will increase Imdur to 120 mg daily.  Could consider Ranexa if symptoms persist.  Continue aspirin, metoprolol, amlodipine, Imdur as above, and Crestor as below.    2. Chronic diastolic heart failure:  Echocardiogram in 01/2022 showed EF 70 to 35%, vigorous LV function, G1 DD.  She has stable chronic dyspnea.  Euvolemic and well compensated on exam.  If symptoms progress, consider repeat echo. Continue current medications as above.   3. Carotid artery stenosis: Most recent carotid ultrasound in 01/2023 showed 1 to 39% B ICA stenosis. Repeat study recommended in 1 year (01/2024).  Continue aspirin, Crestor.   4. Hypertension: BP well controlled. Continue current antihypertensive regimen.    5.  Hyperlipidemia: LDL was 51 in 12/2022. Previously on Lipitor, this was later switched to Crestor. Crestor was recently increased to 40 mg daily.  Recommend 2-week statin holiday as below in the setting of arthralgia/myalgia.  If symptoms improve, would recommend referral to lipid clinic Pharm.D. for consideration of PCSK9 inhibitor and lipid-lowering therapy.  6. Bilateral leg pain: She notes an 8-10 month history of bilateral leg pain which she describes as a throbbing/aching. Recent ABIs were normal.  Will trial statin holiday as above.   7. Disposition: Follow-up in 3-4 months with Dr. Jens Som.       Joylene Grapes, NP 04/30/2023, 11:58 AM

## 2023-04-30 NOTE — Telephone Encounter (Signed)
Pt c/o medication issue:  1. Name of Medication:   isosorbide mononitrate (IMDUR) 30 MG 24 hr tablet    2. How are you currently taking this medication (dosage and times per day)? As written   3. Are you having a reaction (difficulty breathing--STAT)? no  4. What is your medication issue? Pharmacy called in to confirm that Irving Burton, NP is increasing pt's imdur.

## 2023-04-30 NOTE — Patient Instructions (Addendum)
Medication Instructions:  Increase Imdur 120 mg daily   *If you need a refill on your cardiac medications before your next appointment, please call your pharmacy*   Lab Work: NONE ordered at this time of appointment   If you have labs (blood work) drawn today and your tests are completely normal, you will receive your results only by: MyChart Message (if you have MyChart) OR A paper copy in the mail If you have any lab test that is abnormal or we need to change your treatment, we will call you to review the results.   Testing/Procedures: NONE ordered at this time of appointment     Follow-Up: At [facility], you and your health needs are our priority.  As part of our continuing mission to provide you with exceptional heart care, we have created designated Provider Care Teams.  These Care Teams include your primary Cardiologist (physician) and Advanced Practice Providers (APPs -  Physician Assistants and Nurse Practitioners) who all work together to provide you with the care you need, when you need it.  We recommend signing up for the patient portal called "MyChart".  Sign up information is provided on this After Visit Summary.  MyChart is used to connect with patients for Virtual Visits (Telemedicine).  Patients are able to view lab/test results, encounter notes, upcoming appointments, etc.  Non-urgent messages can be sent to your provider as well.   To learn more about what you can do with MyChart, go to [email or web address].    Your next appointment:   3-4 month(s)  Provider:   Olga Millers, MD     Other Instructions

## 2023-04-30 NOTE — Telephone Encounter (Signed)
Left message verifying that Isosorbide Mononitrate is increasing to 60mg  in morning and 60mg  in evening for a total of 120mg  a day. Instructed to call back with any questions.

## 2023-05-01 ENCOUNTER — Encounter: Payer: Self-pay | Admitting: Family Medicine

## 2023-05-01 ENCOUNTER — Ambulatory Visit (INDEPENDENT_AMBULATORY_CARE_PROVIDER_SITE_OTHER): Payer: Medicare Other | Admitting: Family Medicine

## 2023-05-01 VITALS — BP 138/75 | HR 74 | Temp 97.3°F | Ht 64.0 in | Wt 141.4 lb

## 2023-05-01 DIAGNOSIS — M62838 Other muscle spasm: Secondary | ICD-10-CM | POA: Diagnosis not present

## 2023-05-01 DIAGNOSIS — I2510 Atherosclerotic heart disease of native coronary artery without angina pectoris: Secondary | ICD-10-CM

## 2023-05-01 DIAGNOSIS — Z79899 Other long term (current) drug therapy: Secondary | ICD-10-CM

## 2023-05-01 DIAGNOSIS — I10 Essential (primary) hypertension: Secondary | ICD-10-CM | POA: Diagnosis not present

## 2023-05-01 DIAGNOSIS — F329 Major depressive disorder, single episode, unspecified: Secondary | ICD-10-CM | POA: Diagnosis not present

## 2023-05-01 DIAGNOSIS — F411 Generalized anxiety disorder: Secondary | ICD-10-CM

## 2023-05-01 MED ORDER — CYCLOBENZAPRINE HCL 5 MG PO TABS: 5.0000 mg | ORAL_TABLET | Freq: Two times a day (BID) | ORAL | 1 refills | Status: DC | PRN

## 2023-05-01 NOTE — Progress Notes (Signed)
OFFICE VISIT  05/01/2023  CC:  Chief Complaint  Patient presents with   Medical Management of Chronic Issues    Patient is a 77 y.o. female who presents for 59-month follow-up labile hypertension, recurrent major depressive disorder resistance to treatment, GAD. A/P as of last visit: "#1 URI with bronchitis. Resolving appropriately status post prednisone and Omnicef.   2.  UTI, resolved with Omnicef.   3.  Uncontrolled hypertension.  She has had a significant history of labile blood pressures. Will cautiously add amlodipine at 5 mg every evening.  Continue Toprol-XL 100 mg every morning. Rechecking basic metabolic panel today.   4.  Chest pain. EKG today shows right bundle branch block otherwise normal (no change)--> I consulted with Dr. Jens Som about this today and he agreed. History of nonobstructive coronary artery disease. Nonurgent follow-up with cardiology recommended--we have helped arrange appointment for about 2 weeks from now. In the meantime, continue aspirin, Imdur, Toprol, and rosuvastatin. Working on better blood pressure control."  INTERIM HX:  Linda [name] is doing fine. Mood and anxiety levels are stable. Chronic pain is pretty stable, requests Flexeril refill for as needed use for back spasms.  Since I saw her she got some further evaluation by cardiology and she got CTA coronary which showed significantly elevated coronary calcium but nonobstructive coronary disease. they increased her Imdur and Crestor. She denies chest pain.  No lower extremity swelling.  Tolerating Crestor and Imdur well.  She has had a couple of URIs since I last saw her but states these are all clear now.  ROS as above, plus--> no fevers, no CP, no SOB, no wheezing, no cough, no dizziness, no HAs, no rashes, no melena/hematochezia.  No polyuria or polydipsia.  No focal weakness, paresthesias, or tremors.  No acute vision or hearing abnormalities.  No dysuria or unusual/new urinary urgency or  frequency.  No n/v/d or abd pain.  No palpitations.     PMP AWARE reviewed today: most recent rx for alprazolam was filled 04/30/2023, # 90, rx by me. Her pain medications are managed by Dr. Hermelinda Medicus. No red flags.  Past Medical History:  Diagnosis Date   Acute upper GI bleed 06/2014   while pt taking coumadin, plavix, and meloxicam---despite being told not to take coumadin.   Anginal pain (HCC)    Nonobstructive CAD 2014; however, her cardiologist put her on a statin for this and NOT for hyperlipidemia per pt report.  Atyp CP 08/2017 at card f/u, plan for myoc perf imaging.   Anxiety    panic attacks   Asthma    w/ asbestososis    BPPV (benign paroxysmal positional vertigo) 12/16/2012   CAD (coronary artery disease)    Nonobstructive ([facility])   Chronic diastolic CHF (congestive heart failure) (HCC)    dry wt as of 11/06/16 is 168 lbs.   Chronic lower back pain    COPD (chronic obstructive pulmonary disease) (HCC)    DDD (degenerative disc disease)    lumbar and cervical.    Diverticular disease    Fibromyalgia    Patient states dx was around her late 43s but she had sx's for years prior to this.   H/O hiatal hernia    History of pneumonia    hospitalized 12/2011, 02/2013, and 07/2013 [facility]) for this   HTN (hypertension)    Renal artery dopplers 04/2013 neg for stenosis.   Hypervitaminosis D 09/27/2019   over-supplemented.  Stopped vit D and plan recheck 2 mo.   Idiopathic angio-edema-urticaria  16109; 2021   Angioedema component was very minimal.  2021->Dr. Bobbitt (allergist) eval.   Insomnia    Iron deficiency anemia    Hematologist in [place], [place] did extensive w/u; no cause found; failed oral supplement;; gets fairly regular (q58m or so) IV iron infusions (Venofer -iron sucrose- 200mg  with procrit.  "for 14 yr I've been getting blood work q month & getting infusions prn" (07/12/2013).  Dr. Myna Hidalgo locally, iron infusions done, EPO deficiency dx'd   Kidney stone    Migraine  syndrome    "not as often anymore; used to be ~ q wk" (07/12/2013)   Mixed incontinence urge and stress    Nephrolithiasis    "passed all on my own or they are still in there" (07/12/2013)   Neuroleptic induced parkinsonism (HCC) 2018   Dr. Arbutus Leas, neuro, saw her 11/24/17 and recommended d/c of abilify as first step.  D/c'd abilify and pt got complete recovery.   Oropharyngeal dysphagia    swallowing study speech path 05/2020. Gastric bx's showed gastritis, h pylori NEG   OSA on CPAP    prior to move to [place]--had another sleep study 10/2015 w/pulm Dr. Su Monks.   Osteoarthritis    "severe; progressing fast" (07/12/2013); multiple joints-not surgical candidate for TKR (03/2015).  Triamcinolon knee injections by Dr. Hermelinda Medicus 12/2017.   Pernicious anemia 08/24/2014   Pleural plaque with presence of asbestos 07/22/2013   Pulmonary embolism (HCC) 07/2013   Dx at [facility] with very small peripheral upper lobe pe 07/2013: pt took coumadin for about 8-9 mo   Pyelonephritis    "several times over the last 30 yr" (07/12/2013)   RBBB (right bundle branch block)    Recurrent major depression (HCC)    Recurrent UTI    hx of hospitalization for pyelonephritis; started abx prophylaxis 06/2015   Syncope    Hypotensive; ED visit--Dr. Sharyn Lull did Cath--nonobstructive CAD, EF 55-60%.  In retrospect, suspect pt rx med misuse/polypharmacy   Vertebral compression fracture (HCC)    Acute T12 on 02/27/22 (fall).  multiple old thoracic-->neurosurg to do MRI as of 05/2022    Past Surgical History:  Procedure Laterality Date   APPENDECTOMY  1960   AXILLARY SURGERY Left 1978   Multiple "lump" in armpit per pt   BIOPSY  06/17/2020   Gastric bx->gastritis, h pylori neg.  Procedure: BIOPSY;  Surgeon: Jeani Hawking, MD;  Location: [facility];  Service: Endoscopy;;   CARDIAC CATHETERIZATION  01/2013   nonobstructive CAD, EF 55-60%   CARDIOVASCULAR STRESS TEST  02/22/2015   Low risk myocard perf imaging; wall motion normal, normal EF    CARDIOVASCULAR STRESS TEST     09/2017 myo perf low risk   carotid duplex doppler  10/21/2017   01/2023 1-30% bilat-->rpt 1 yr   COCCYX REMOVAL  1972   CT CTA CORONARY W/CA SCORE W/CM &/OR WO/CM     04/02/23 96%'tile calcium score, nonobst CAD-->crestor increased to 40   DEXA  06/05/2017   T-score -3.1   DILATION AND CURETTAGE OF UTERUS  ? 1970's   ESOPHAGOGASTRODUODENOSCOPY N/A 07/19/2014   Gastritis found + in the setting of supratherapeutic INR, +plavix, + meloxicam.   ESOPHAGOGASTRODUODENOSCOPY (EGD) WITH PROPOFOL N/A 06/17/2020   NO stricture or other prob to explain pt's dysphagia, dilation was done anyway.  Gastric bx's-->gastritis, h pylori neg. Procedure: ESOPHAGOGASTRODUODENOSCOPY (EGD) WITH PROPOFOL;  Surgeon: Jeani Hawking, MD;  Location: [facility];  Service: Endoscopy;  Laterality: N/A;   EYE SURGERY Left 2012-2013   "injections for ~  1 yr; don't really know what for" (07/12/2013)   HEEL SPUR SURGERY Left 2008   kidney stone removal Right    KNEE SURGERY  2005   LEFT HEART CATHETERIZATION WITH CORONARY ANGIOGRAM N/A 01/30/2013   Procedure: LEFT HEART CATHETERIZATION WITH CORONARY ANGIOGRAM;  Surgeon: Robynn Pane, MD;  Location: [facility];  Service: Cardiovascular;  Laterality: N/A;   MALONEY DILATION  06/17/2020   Procedure: Elease Hashimoto DILATION;  Surgeon: Jeani Hawking, MD;  Location: [facility];  Service: Endoscopy;;   PLANTAR FASCIA RELEASE Left 2008   SPIROMETRY  04/25/2014   In hosp for acute asthma/COPD flare: mixed obstructive and restrictive lung disease. The FEV1 is severely reduced at 45% predicted.  FEV1 signif decreased compared to prior spirometry 07/23/13.   TENDON RELEASE  1996   Right forearm and hand   TOTAL ABDOMINAL HYSTERECTOMY  1974   TRANSTHORACIC ECHOCARDIOGRAM  01/2013; 04/2014;08/2015; 09/2017   2014--NORMAL.  2015--focal basal septal hypertrophy, EF 55-60%, grade I diast dysfxn, mild LAE.  08/2015 EF 55-60%, nl LV syst fxn, grade I DD, valves wnl.  10/21/17: EF 65-70%, grd I DD, o/w normal. 02/17/22 EF 70-75%, hyperdynamic LV fxn, grd I DD.    Outpatient Medications Prior to Visit  Medication Sig Dispense Refill   albuterol (2.5 MG/3ML) 0.083% NEBU 3 mL, albuterol (5 MG/ML) 0.5% NEBU 0.5 mL Take 5 mg by nebulization. PRN     albuterol (VENTOLIN HFA) 108 (90 Base) MCG/ACT inhaler Inhale 1-2 puffs into the lungs every 6 (six) hours as needed for wheezing or shortness of breath. 18 g 5   ALPRAZolam (XANAX) 0.5 MG tablet TAKE ONE TABLET BY MOUTH THREE TIMES DAILY 90 tablet 5   amLODipine (NORVASC) 5 MG tablet TAKE ONE TABLET BY MOUTH DAILY 90 tablet 3   aspirin 81 MG tablet Take 81 mg by mouth at bedtime.     diclofenac Sodium (VOLTAREN) 1 % GEL Apply 2 g topically 4 (four) times daily. 4 times a day to both knees 1000 each 4   DULoxetine (CYMBALTA) 60 MG capsule Take 1 capsule (60 mg total) by mouth daily. 90 capsule 1   ipratropium (ATROVENT) 0.03 % nasal spray Place 2 sprays into both nostrils every 12 (twelve) hours as needed (allergies). 30 mL 5   isosorbide mononitrate (IMDUR) 30 MG 24 hr tablet Take 2 tablets (60 mg total) by mouth in the morning and at bedtime. 270 tablet 2   lamoTRIgine (LAMICTAL) 200 MG tablet Take 1 tablet (200 mg total) by mouth daily. 90 tablet 1   metoprolol succinate (TOPROL-XL) 100 MG 24 hr tablet take ONE tab daily, take additional 1/2 tab as needed FOR FOR BLOOD PRESSURE > 160/90 135 tablet 1   metoprolol tartrate (LOPRESSOR) 50 MG tablet Take 2 hours prior to CT scan 1 tablet 0   Misc Natural Products (NEURIVA PO) Take by mouth.     Multiple Vitamin (MULTIVITAMIN WITH MINERALS) TABS tablet Take 1 tablet by mouth daily.     nitroGLYCERIN (NITROSTAT) 0.4 MG SL tablet Place 1 tablet (0.4 mg total) under the tongue every 5 (five) minutes as needed for chest pain (x 3 doses). 25 tablet 11   oxyCODONE-acetaminophen (PERCOCET) 10-325 MG tablet Take 1 tablet by mouth every 6 (six) hours as needed for pain. 120 tablet  0   pantoprazole (PROTONIX) 40 MG tablet Take 1 tablet (40 mg total) by mouth 2 (two) times daily. 180 tablet 1   rOPINIRole (REQUIP) 0.5 MG tablet TAKE ONE TABLET  BY MOUTH AT BEDTIME 90 tablet 1   rosuvastatin (CRESTOR) 40 MG tablet Take 1 tablet (40 mg total) by mouth every evening. 90 tablet 3   solifenacin (VESICARE) 10 MG tablet TAKE 1 TABLET ONCE A DAY. 30 tablet 0   traZODone (DESYREL) 50 MG tablet take 2-4 tablets at bedtime as needed for insomnia. 120 tablet 5   cyclobenzaprine (FLEXERIL) 5 MG tablet Take 5 mg by mouth 2 (two) times daily as needed.     methocarbamol (ROBAXIN) 500 MG tablet Take 1 tablet (500 mg total) by mouth every 6 (six) hours as needed for muscle spasms. 80 tablet 2   EPINEPHrine 0.3 mg/0.3 mL IJ SOAJ injection Inject 0.3 mg into the muscle as needed for anaphylaxis. (Patient not taking: Reported on 05/01/2023) 1 each 1   cefdinir (OMNICEF) 300 MG capsule Take 1 capsule (300 mg total) by mouth 2 (two) times daily. (Patient not taking: Reported on 04/23/2023) 14 capsule 0   donepezil (ARICEPT) 10 MG tablet Take 1 tablet (10 mg total) by mouth at bedtime. (Patient not taking: Reported on 05/01/2023) 90 tablet 1   No facility-administered medications prior to visit.    Allergies  Allergen Reactions   Aripiprazole Other (See Comments) and Rash    parkinsonism Other reaction(s): Other parkinsonism  parkinsonism parkinsonism   Penicillins Itching, Swelling, Rash and Shortness Of Breath    Tolerated Cefepime in ED. Has patient had a PCN reaction causing immediate rash, facial/tongue/throat swelling, SOB or lightheadedness with hypotension: Yes Has patient had a PCN reaction causing severe rash involving mucus membranes or skin necrosis: No Has patient had a PCN reaction that required hospitalization: No  Has patient had a PCN reaction occurring within the last 10 years: No  Tolerated Cefepime in ED. Has patient had a PCN reaction causing immediate rash,  facial/tongue/throat swelling, SOB or lightheadedness with hypotension: Yes Has patient had a PCN reaction causing severe rash involving mucus membranes or skin necrosis: No Has patient had a PCN reaction that required hospitalization: No  Has patient had a PCN reaction occurring within the last 10 years: No Rash,swelling Tolerated Cefepime in ED.  Rash,swelling  Tolerated Cefepime in ED.  Tolerated Cefepime in ED. Has patient had a PCN reaction causing immediate rash, facial/tongue/throat swelling, SOB or lightheadedness with hypotension: Yes Has patient had a PCN reaction causing severe rash involving mucus membranes or skin necrosis: No Has patient had a PCN reaction that required hospitalization: No  Has patient had a PCN reaction occurring within the last 10 years: No   Bactrim [Sulfamethoxazole-Trimethoprim] Rash    Review of Systems As per HPI  PE:    05/01/2023    9:43 AM 04/30/2023   11:25 AM 04/09/2023    2:19 PM  Vitals with BMI  Height 5\' 4"  5\' 4"  5\' 4"   Weight 141 lbs 6 oz 140 lbs 3 oz 137 lbs  BMI 24.26 24.05 23.5  Systolic 138 124 161  Diastolic 75 74 87  Pulse 74 71 75    Physical Exam  Gen: Alert, well appearing.  Patient is oriented to person, place, time, and situation. AFFECT: pleasant, lucid thought and speech. CV: RRR, no m/r/g.   LUNGS: CTA bilat, nonlabored resps, good aeration in all lung fields. EXT: no clubbing or cyanosis.  no edema.    LABS:  Last CBC Lab Results  Component Value Date   WBC 6.2 02/23/2023   HGB 11.6 (L) 02/23/2023   HCT 34.6 (L) 02/23/2023   MCV  86.7 02/23/2023   MCH 29.1 02/23/2023   RDW 12.9 02/23/2023   PLT 147 (L) 02/23/2023   Lab Results  Component Value Date   IRON 116 06/05/2015   TIBC 284 06/05/2015   FERRITIN 1,612 (H) 06/05/2015   Last metabolic panel Lab Results  Component Value Date   GLUCOSE 103 (H) 03/26/2023   NA 141 03/26/2023   K 3.9 03/26/2023   CL 101 03/26/2023   CO2 25 03/26/2023   BUN 18  03/26/2023   CREATININE 0.93 03/26/2023   EGFR 63 03/26/2023   CALCIUM 9.1 03/26/2023   PHOS 4.0 02/27/2022   PROT 5.8 (L) 02/23/2023   ALBUMIN 3.2 (L) 02/23/2023   BILITOT 1.4 (H) 02/23/2023   ALKPHOS 58 02/23/2023   AST 19 02/23/2023   ALT 8 02/23/2023   ANIONGAP 12 02/23/2023   Last lipids Lab Results  Component Value Date   CHOL 113 01/01/2023   HDL 42.20 01/01/2023   LDLCALC 51 01/01/2023   TRIG 102.0 01/01/2023   CHOLHDL 3 01/01/2023   Last hemoglobin A1c Lab Results  Component Value Date   HGBA1C 5.3 02/27/2022   Last thyroid functions Lab Results  Component Value Date   TSH 1.488 02/27/2022   Last vitamin D Lab Results  Component Value Date   VD25OH 87.03 10/22/2019   Last vitamin B12 and Folate Lab Results  Component Value Date   VITAMINB12 962 (H) 02/27/2022   FOLATE 4.8 12/14/2012   IMPRESSION AND PLAN:  #1 hypertension, well-controlled on amlodipine 5 mg a day. Electrolytes and renal function normal 1 month ago.  2.  Recurrent major depressive disorder, generalized anxiety disorder--> use of high-risk medications. Stable on Lamictal 200 mg a day, Cymbalta 60 mg a day, and Xanax 0.5 mg 3 times daily.  3.  Coronary artery disease--- nonobstructive on recent evaluation--with chronic diastolic dysfunction. She is asymptomatic. Cardiology maximized her Imdur at 60 mg twice a day and Crestor 40 mg a day. Additionally, continue Toprol-XL 150 mg a day and aspirin 81 mg daily.  4.  Chronic neck, back, and knees pain. She is managed by physical medicine and rehab.  I do prescribe her Flexeril for as needed back spasm use.  Refilled today, 5 mg tab, 1 twice daily as needed, #60, refill x 1.  No labs today  An After Visit Summary was printed and given to the patient.  FOLLOW UP: Return in about 3 months (around 08/01/2023) for routine chronic illness f/u.  Signed:  Santiago Bumpers, MD           05/01/2023

## 2023-05-02 DIAGNOSIS — M6281 Muscle weakness (generalized): Secondary | ICD-10-CM | POA: Diagnosis not present

## 2023-05-05 DIAGNOSIS — M6281 Muscle weakness (generalized): Secondary | ICD-10-CM | POA: Diagnosis not present

## 2023-05-07 DIAGNOSIS — M6281 Muscle weakness (generalized): Secondary | ICD-10-CM | POA: Diagnosis not present

## 2023-05-12 ENCOUNTER — Other Ambulatory Visit: Payer: Self-pay | Admitting: Family Medicine

## 2023-05-12 DIAGNOSIS — M6281 Muscle weakness (generalized): Secondary | ICD-10-CM | POA: Diagnosis not present

## 2023-05-14 DIAGNOSIS — M6281 Muscle weakness (generalized): Secondary | ICD-10-CM | POA: Diagnosis not present

## 2023-05-15 DIAGNOSIS — M170 Bilateral primary osteoarthritis of knee: Secondary | ICD-10-CM | POA: Diagnosis not present

## 2023-05-19 DIAGNOSIS — M6281 Muscle weakness (generalized): Secondary | ICD-10-CM | POA: Diagnosis not present

## 2023-05-20 ENCOUNTER — Telehealth: Payer: Self-pay

## 2023-05-20 NOTE — Telephone Encounter (Signed)
Left message to call back to set up tele pre op appt.    ANESTHESIA IS LISTED AS: CHOICE

## 2023-05-20 NOTE — Telephone Encounter (Signed)
   Pre-operative Risk Assessment    Patient Name: Linda [name]  DOB: 1946-11-03 MRN: [id number]      Request for Surgical Clearance    Procedure:   Rt total knee arthroplasty  Date of Surgery:  Clearance TBD                                 Surgeon:  Dr. Ollen Gross Surgeon's Group or Practice Name:  [facility] Phone number:  [phone] Fax number:  [phone]   Type of Clearance Requested:   - Medical    Type of Anesthesia:  Not Indicated   Additional requests/questions:    Garrel Ridgel   05/20/2023, 11:02 AM

## 2023-05-20 NOTE — Telephone Encounter (Signed)
   Name: Linda [name]  DOB: [birth year]  MRN: [id number]  Primary Cardiologist: Olga Millers, MD   Preoperative team, please contact this patient and set up a phone call appointment for further preoperative risk assessment. Please obtain consent and complete medication review. Thank you for your help.  I confirm that guidance regarding antiplatelet and oral anticoagulation therapy has been completed and, if necessary, noted below.  Regarding ASA therapy, we recommend continuation of ASA throughout the perioperative period.  However, if the surgeon feels that cessation of ASA is required in the perioperative period, it may be stopped 5-7 days prior to surgery with a plan to resume it as soon as felt to be feasible from a surgical standpoint in the post-operative period.    Napoleon Form, Leodis Rains, NP 05/20/2023, 11:30 AM [facility]

## 2023-05-20 NOTE — Telephone Encounter (Signed)
Good Morning Linda [name]  We have received a surgical clearance request for Linda [name]. They were seen recently in clinic on 04/30/2023.  She has a PMH of nonobstructive CAD, HFpEF, carotid artery stenosis, HTN, HLD.  Can you please comment on surgical clearance and guidance on holding ASA 81 mg. Please forward you guidance and recommendations to P CV DIV PREOP

## 2023-05-20 NOTE — Telephone Encounter (Signed)
Left VM for pt to call the office to schedule Surgery clearance appt.

## 2023-05-21 DIAGNOSIS — M6281 Muscle weakness (generalized): Secondary | ICD-10-CM | POA: Diagnosis not present

## 2023-05-21 NOTE — Telephone Encounter (Signed)
2 nd attempt to reach pt to set up tele pre op appt. 

## 2023-05-21 NOTE — Telephone Encounter (Signed)
Spoke with pt and was disconnected. Please schedule surgical clearance appt

## 2023-05-21 NOTE — Telephone Encounter (Signed)
Patient called back regarding surgery clearance. She missed the call yesterday due to PT. Please give the patient a call back.

## 2023-05-22 ENCOUNTER — Telehealth: Payer: Self-pay | Admitting: *Deleted

## 2023-05-22 NOTE — Telephone Encounter (Signed)
Pt has been scheduled for tele pre op appt 06/04/23 @ 10:20. Med rec and consent are done.

## 2023-05-22 NOTE — Telephone Encounter (Signed)
Pt has been scheduled for tele pre op appt 06/04/23 @ 10:20. Med rec and consent are done.      Patient Consent for Virtual Visit        Linda [name] has provided verbal consent on 05/22/2023 for a virtual visit (video or telephone).   CONSENT FOR VIRTUAL VISIT FOR:  Linda [name]  By participating in this virtual visit I agree to the following:  I hereby voluntarily request, consent and authorize [facility] and its employed or contracted physicians, physician assistants, nurse practitioners or other licensed health care professionals (the Practitioner), to provide me with telemedicine health care services (the "Services") as deemed necessary by the treating Practitioner. I acknowledge and consent to receive the Services by the Practitioner via telemedicine. I understand that the telemedicine visit will involve communicating with the Practitioner through live audiovisual communication technology and the disclosure of certain medical information by electronic transmission. I acknowledge that I have been given the opportunity to request an in-person assessment or other available alternative prior to the telemedicine visit and am voluntarily participating in the telemedicine visit.  I understand that I have the right to withhold or withdraw my consent to the use of telemedicine in the course of my care at any time, without affecting my right to future care or treatment, and that the Practitioner or I may terminate the telemedicine visit at any time. I understand that I have the right to inspect all information obtained and/or recorded in the course of the telemedicine visit and may receive copies of available information for a reasonable fee.  I understand that some of the potential risks of receiving the Services via telemedicine include:  Delay or interruption in medical evaluation due to technological equipment failure or disruption; Information transmitted may not be  sufficient (e.g. poor resolution of images) to allow for appropriate medical decision making by the Practitioner; and/or  In rare instances, security protocols could fail, causing a breach of personal health information.  Furthermore, I acknowledge that it is my responsibility to provide information about my medical history, conditions and care that is complete and accurate to the best of my ability. I acknowledge that Practitioner's advice, recommendations, and/or decision may be based on factors not within their control, such as incomplete or inaccurate data provided by me or distortions of diagnostic images or specimens that may result from electronic transmissions. I understand that the practice of medicine is not an exact science and that Practitioner makes no warranties or guarantees regarding treatment outcomes. I acknowledge that a copy of this consent can be made available to me via my patient portal [facility] MyChart), or I can request a printed copy by calling the office of [facility].    I understand that my insurance will be billed for this visit.   I have read or had this consent read to me. I understand the contents of this consent, which adequately explains the benefits and risks of the Services being provided via telemedicine.  I have been provided ample opportunity to ask questions regarding this consent and the Services and have had my questions answered to my satisfaction. I give my informed consent for the services to be provided through the use of telemedicine in my medical care

## 2023-05-26 DIAGNOSIS — M6281 Muscle weakness (generalized): Secondary | ICD-10-CM | POA: Diagnosis not present

## 2023-05-28 DIAGNOSIS — M6281 Muscle weakness (generalized): Secondary | ICD-10-CM | POA: Diagnosis not present

## 2023-05-30 ENCOUNTER — Ambulatory Visit (HOSPITAL_COMMUNITY): Payer: Medicare Other

## 2023-05-30 ENCOUNTER — Ambulatory Visit (INDEPENDENT_AMBULATORY_CARE_PROVIDER_SITE_OTHER): Payer: Medicare Other

## 2023-05-30 ENCOUNTER — Ambulatory Visit
Admission: EM | Admit: 2023-05-30 | Discharge: 2023-05-30 | Disposition: A | Discharge status: Home / Self Care | Payer: Medicare Other | Attending: Urgent Care | Admitting: Urgent Care

## 2023-05-30 DIAGNOSIS — J9611 Chronic respiratory failure with hypoxia: Secondary | ICD-10-CM

## 2023-05-30 DIAGNOSIS — J61 Pneumoconiosis due to asbestos and other mineral fibers: Secondary | ICD-10-CM | POA: Diagnosis not present

## 2023-05-30 DIAGNOSIS — R509 Fever, unspecified: Secondary | ICD-10-CM | POA: Diagnosis not present

## 2023-05-30 DIAGNOSIS — R053 Chronic cough: Secondary | ICD-10-CM | POA: Diagnosis not present

## 2023-05-30 DIAGNOSIS — R062 Wheezing: Secondary | ICD-10-CM | POA: Diagnosis not present

## 2023-05-30 DIAGNOSIS — R059 Cough, unspecified: Secondary | ICD-10-CM | POA: Diagnosis not present

## 2023-05-30 MED ORDER — IPRATROPIUM-ALBUTEROL 0.5-2.5 (3) MG/3ML IN SOLN
3.0000 mL | Freq: Once | RESPIRATORY_TRACT | Status: AC
  Administered 2023-05-30: 3 mL via RESPIRATORY_TRACT

## 2023-05-30 MED ORDER — IPRATROPIUM-ALBUTEROL 0.5-2.5 (3) MG/3ML IN SOLN: 3.0000 mL | RESPIRATORY_TRACT | 0 refills | Status: DC | PRN

## 2023-05-30 NOTE — Discharge Instructions (Addendum)
Use your oxygen at home as soon as you get there. Keep it on. Please provide another nebulizer treatment at 6pm. Monitor your pulse oximetry. If you continue to remain less than 90% then please go to the emergency room immediately. Call your pulmonologist otherwise and set up an urgent consultation with them.

## 2023-05-30 NOTE — ED Provider Notes (Signed)
[facility]  Note:  This document was prepared using Conservation officer, historic buildings and may include unintentional dictation errors.  MRN: [id number] DOB: 1946-09-02  Subjective:   Linda [name] is a 77 y.o. female with chronic CHF, pulmonary asbestosis presenting for 39-month history of persistent coughing, wheezing, low-grade fevers, malaise and fatigue, dyspnea. Has taken 2 rounds of prednisone, cefdinir last used in early May 2024, was managed for bronchitis.  Her medical chart has a problem list including COPD and chronic respiratory failure with hypoxia but per the patient these diagnoses were changed.  She does have albuterol that she can use at home.  No other daily inhalers.  I cannot find any particular document the changes to her diagnoses for COPD and chronic respiratory failure with hypoxia.  Patient does admit that she was supposed to be on oxygen at 3 L daily and nightly.  Over time she discontinued this herself and has not used it over the past year as she felt like she was better.  Has not followed up with her pulmonologist.  Does not monitor her oximetry at home.  Regarding congestive heart failure, last echocardiogram showed a left ventricular ejection fraction of 70%-75% on 02/17/2022.  No current facility-administered medications for this encounter.  Current Outpatient Medications:    albuterol (2.5 MG/3ML) 0.083% NEBU 3 mL, albuterol (5 MG/ML) 0.5% NEBU 0.5 mL, Take 5 mg by nebulization. PRN, Disp: , Rfl:    albuterol (VENTOLIN HFA) 108 (90 Base) MCG/ACT inhaler, Inhale 1-2 puffs into the lungs every 6 (six) hours as needed for wheezing or shortness of breath., Disp: 18 g, Rfl: 5   ALPRAZolam (XANAX) 0.5 MG tablet, TAKE ONE TABLET BY MOUTH THREE TIMES DAILY, Disp: 90 tablet, Rfl: 5   amLODipine (NORVASC) 5 MG tablet, TAKE ONE TABLET BY MOUTH DAILY, Disp: 90 tablet, Rfl: 3   aspirin 81 MG tablet, Take 81 mg by mouth at bedtime., Disp: , Rfl:     cyclobenzaprine (FLEXERIL) 5 MG tablet, Take 1 tablet (5 mg total) by mouth 2 (two) times daily as needed., Disp: 60 tablet, Rfl: 1   diclofenac Sodium (VOLTAREN) 1 % GEL, Apply 2 g topically 4 (four) times daily. 4 times a day to both knees, Disp: 1000 each, Rfl: 4   DULoxetine (CYMBALTA) 60 MG capsule, Take 1 capsule (60 mg total) by mouth daily., Disp: 90 capsule, Rfl: 1   EPINEPHrine 0.3 mg/0.3 mL IJ SOAJ injection, Inject 0.3 mg into the muscle as needed for anaphylaxis. (Patient not taking: Reported on 05/01/2023), Disp: 1 each, Rfl: 1   ipratropium (ATROVENT) 0.03 % nasal spray, Place 2 sprays into both nostrils every 12 (twelve) hours as needed (allergies)., Disp: 30 mL, Rfl: 5   isosorbide mononitrate (IMDUR) 30 MG 24 hr tablet, Take 2 tablets (60 mg total) by mouth in the morning and at bedtime., Disp: 270 tablet, Rfl: 2   lamoTRIgine (LAMICTAL) 200 MG tablet, Take 1 tablet (200 mg total) by mouth daily., Disp: 90 tablet, Rfl: 1   metoprolol succinate (TOPROL-XL) 100 MG 24 hr tablet, take ONE tab daily, take additional 1/2 tab as needed FOR FOR BLOOD PRESSURE > 160/90, Disp: 135 tablet, Rfl: 1   metoprolol tartrate (LOPRESSOR) 50 MG tablet, Take 2 hours prior to CT scan, Disp: 1 tablet, Rfl: 0   Misc Natural Products (NEURIVA PO), Take by mouth., Disp: , Rfl:    Multiple Vitamin (MULTIVITAMIN WITH MINERALS) TABS tablet, Take 1 tablet by mouth daily.,  Disp: , Rfl:    nitroGLYCERIN (NITROSTAT) 0.4 MG SL tablet, Place 1 tablet (0.4 mg total) under the tongue every 5 (five) minutes as needed for chest pain (x 3 doses). (Patient not taking: Reported on 05/22/2023), Disp: 25 tablet, Rfl: 11   oxyCODONE-acetaminophen (PERCOCET) 10-325 MG tablet, Take 1 tablet by mouth every 6 (six) hours as needed for pain., Disp: 120 tablet, Rfl: 0   pantoprazole (PROTONIX) 40 MG tablet, Take 1 tablet (40 mg total) by mouth 2 (two) times daily., Disp: 180 tablet, Rfl: 1   rOPINIRole (REQUIP) 0.5 MG tablet, TAKE ONE  TABLET BY MOUTH AT BEDTIME, Disp: 90 tablet, Rfl: 1   rosuvastatin (CRESTOR) 40 MG tablet, Take 1 tablet (40 mg total) by mouth every evening., Disp: 90 tablet, Rfl: 3   solifenacin (VESICARE) 10 MG tablet, TAKE ONE TABLET BY MOUTH ONCE DAILY **NEED OFFICE VISIT** FOR FURTHER REFILLS, Disp: 30 tablet, Rfl: 2   traZODone (DESYREL) 50 MG tablet, take 2-4 tablets at bedtime as needed for insomnia., Disp: 120 tablet, Rfl: 5   Allergies  Allergen Reactions   Aripiprazole Other (See Comments) and Rash    parkinsonism Other reaction(s): Other parkinsonism  parkinsonism parkinsonism   Penicillins Itching, Swelling, Rash and Shortness Of Breath    Tolerated Cefepime in ED. Has patient had a PCN reaction causing immediate rash, facial/tongue/throat swelling, SOB or lightheadedness with hypotension: Yes Has patient had a PCN reaction causing severe rash involving mucus membranes or skin necrosis: No Has patient had a PCN reaction that required hospitalization: No  Has patient had a PCN reaction occurring within the last 10 years: No  Tolerated Cefepime in ED. Has patient had a PCN reaction causing immediate rash, facial/tongue/throat swelling, SOB or lightheadedness with hypotension: Yes Has patient had a PCN reaction causing severe rash involving mucus membranes or skin necrosis: No Has patient had a PCN reaction that required hospitalization: No  Has patient had a PCN reaction occurring within the last 10 years: No Rash,swelling Tolerated Cefepime in ED.  Rash,swelling  Tolerated Cefepime in ED.  Tolerated Cefepime in ED. Has patient had a PCN reaction causing immediate rash, facial/tongue/throat swelling, SOB or lightheadedness with hypotension: Yes Has patient had a PCN reaction causing severe rash involving mucus membranes or skin necrosis: No Has patient had a PCN reaction that required hospitalization: No  Has patient had a PCN reaction occurring within the last 10 years: No   Bactrim  [Sulfamethoxazole-Trimethoprim] Rash    Past Medical History:  Diagnosis Date   Acute upper GI bleed 06/2014   while pt taking coumadin, plavix, and meloxicam---despite being told not to take coumadin.   Anginal pain (HCC)    Nonobstructive CAD 2014; however, her cardiologist put her on a statin for this and NOT for hyperlipidemia per pt report.  Atyp CP 08/2017 at card f/u, plan for myoc perf imaging.   Anxiety    panic attacks   Asthma    w/ asbestososis    BPPV (benign paroxysmal positional vertigo) 12/16/2012   CAD (coronary artery disease)    Nonobstructive ([facility])   Chronic diastolic CHF (congestive heart failure) (HCC)    dry wt as of 11/06/16 is 168 lbs.   Chronic lower back pain    COPD (chronic obstructive pulmonary disease) (HCC)    DDD (degenerative disc disease)    lumbar and cervical.    Diverticular disease    Fibromyalgia    Patient states dx was around her late 67s but she  had sx's for years prior to this.   H/O hiatal hernia    History of pneumonia    hospitalized 12/2011, 02/2013, and 07/2013 [facility]) for this   HTN (hypertension)    Renal artery dopplers 04/2013 neg for stenosis.   Hypervitaminosis D 09/27/2019   over-supplemented.  Stopped vit D and plan recheck 2 mo.   Idiopathic angio-edema-urticaria 16109; 2021   Angioedema component was very minimal.  2021->Dr. Bobbitt (allergist) eval.   Insomnia    Iron deficiency anemia    Hematologist in [place], [place] did extensive w/u; no cause found; failed oral supplement;; gets fairly regular (q50m or so) IV iron infusions (Venofer -iron sucrose- 200mg  with procrit.  "for 14 yr I've been getting blood work q month & getting infusions prn" (07/12/2013).  Dr. Myna Hidalgo locally, iron infusions done, EPO deficiency dx'd   Kidney stone    Migraine syndrome    "not as often anymore; used to be ~ q wk" (07/12/2013)   Mixed incontinence urge and stress    Nephrolithiasis    "passed all on my own or they are still in there"  (07/12/2013)   Neuroleptic induced parkinsonism (HCC) 2018   Dr. Arbutus Leas, neuro, saw her 11/24/17 and recommended d/c of abilify as first step.  D/c'd abilify and pt got complete recovery.   Oropharyngeal dysphagia    swallowing study speech path 05/2020. Gastric bx's showed gastritis, h pylori NEG   OSA on CPAP    prior to move to [place]--had another sleep study 10/2015 w/pulm Dr. Su Monks.   Osteoarthritis    "severe; progressing fast" (07/12/2013); multiple joints-not surgical candidate for TKR (03/2015).  Triamcinolon knee injections by Dr. Hermelinda Medicus 12/2017.   Pernicious anemia 08/24/2014   Pleural plaque with presence of asbestos 07/22/2013   Pulmonary embolism (HCC) 07/2013   Dx at [facility] with very small peripheral upper lobe pe 07/2013: pt took coumadin for about 8-9 mo   Pyelonephritis    "several times over the last 30 yr" (07/12/2013)   RBBB (right bundle branch block)    Recurrent major depression (HCC)    Recurrent UTI    hx of hospitalization for pyelonephritis; started abx prophylaxis 06/2015   Syncope    Hypotensive; ED visit--Dr. Sharyn Lull did Cath--nonobstructive CAD, EF 55-60%.  In retrospect, suspect pt rx med misuse/polypharmacy   Vertebral compression fracture (HCC)    Acute T12 on 02/27/22 (fall).  multiple old thoracic-->neurosurg to do MRI as of 05/2022     Past Surgical History:  Procedure Laterality Date   APPENDECTOMY  1960   AXILLARY SURGERY Left 1978   Multiple "lump" in armpit per pt   BIOPSY  06/17/2020   Gastric bx->gastritis, h pylori neg.  Procedure: BIOPSY;  Surgeon: Jeani Hawking, MD;  Location: [facility];  Service: Endoscopy;;   CARDIAC CATHETERIZATION  01/2013   nonobstructive CAD, EF 55-60%   CARDIOVASCULAR STRESS TEST  02/22/2015   Low risk myocard perf imaging; wall motion normal, normal EF   CARDIOVASCULAR STRESS TEST     09/2017 myo perf low risk   carotid duplex doppler  10/21/2017   01/2023 1-30% bilat-->rpt 1 yr   COCCYX REMOVAL  1972   CT CTA CORONARY W/CA  SCORE W/CM &/OR WO/CM     04/02/23 96%'tile calcium score, nonobst CAD-->crestor increased to 40   DEXA  06/05/2017   T-score -3.1   DILATION AND CURETTAGE OF UTERUS  ? 1970's   ESOPHAGOGASTRODUODENOSCOPY N/A 07/19/2014   Gastritis found + in the setting of  supratherapeutic INR, +plavix, + meloxicam.   ESOPHAGOGASTRODUODENOSCOPY (EGD) WITH PROPOFOL N/A 06/17/2020   NO stricture or other prob to explain pt's dysphagia, dilation was done anyway.  Gastric bx's-->gastritis, h pylori neg. Procedure: ESOPHAGOGASTRODUODENOSCOPY (EGD) WITH PROPOFOL;  Surgeon: Jeani Hawking, MD;  Location: [facility];  Service: Endoscopy;  Laterality: N/A;   EYE SURGERY Left 2012-2013   "injections for ~ 1 yr; don't really know what for" (07/12/2013)   HEEL SPUR SURGERY Left 2008   kidney stone removal Right    KNEE SURGERY  2005   LEFT HEART CATHETERIZATION WITH CORONARY ANGIOGRAM N/A 01/30/2013   Procedure: LEFT HEART CATHETERIZATION WITH CORONARY ANGIOGRAM;  Surgeon: Robynn Pane, MD;  Location: [facility];  Service: Cardiovascular;  Laterality: N/A;   MALONEY DILATION  06/17/2020   Procedure: Elease Hashimoto DILATION;  Surgeon: Jeani Hawking, MD;  Location: [facility];  Service: Endoscopy;;   PLANTAR FASCIA RELEASE Left 2008   SPIROMETRY  04/25/2014   In hosp for acute asthma/COPD flare: mixed obstructive and restrictive lung disease. The FEV1 is severely reduced at 45% predicted.  FEV1 signif decreased compared to prior spirometry 07/23/13.   TENDON RELEASE  1996   Right forearm and hand   TOTAL ABDOMINAL HYSTERECTOMY  1974   TRANSTHORACIC ECHOCARDIOGRAM  01/2013; 04/2014;08/2015; 09/2017   2014--NORMAL.  2015--focal basal septal hypertrophy, EF 55-60%, grade I diast dysfxn, mild LAE.  08/2015 EF 55-60%, nl LV syst fxn, grade I DD, valves wnl. 10/21/17: EF 65-70%, grd I DD, o/w normal. 02/17/22 EF 70-75%, hyperdynamic LV fxn, grd I DD.    Family History  Problem Relation Age of Onset   Arthritis Mother    Kidney  disease Mother    Heart disease Father    Stroke Father    Hypertension Father    Diabetes Father    Heart attack Father    Heart attack Paternal Grandmother    Diabetes Sister        one sister   Hypertension Sister    Asthma Sister    Hypertension Brother    Asthma Brother    Asthma Daughter    Multiple sclerosis Son     Social History   Tobacco Use   Smoking status: Never   Smokeless tobacco: Never   Tobacco comments:    never used tobacco  Vaping Use   Vaping Use: Never used  Substance Use Topics   Alcohol use: No    Alcohol/week: 0.0 standard drinks of alcohol   Drug use: No    ROS   Objective:   Vitals: BP 133/66 (BP Location: Right Arm)   Pulse 72   Temp 98 F (36.7 C) (Oral)   Resp (!) 28   SpO2 93%   Physical Exam Constitutional:      General: She is not in acute distress.    Appearance: Normal appearance. She is well-developed. She is not ill-appearing, toxic-appearing or diaphoretic.  HENT:     Head: Normocephalic and atraumatic.     Right Ear: External ear normal.     Left Ear: External ear normal.     Nose: Nose normal.     Mouth/Throat:     Mouth: Mucous membranes are moist.     Pharynx: No oropharyngeal exudate or posterior oropharyngeal erythema.  Eyes:     General: No scleral icterus.       Right eye: No discharge.        Left eye: No discharge.     Extraocular Movements: Extraocular movements  intact.  Cardiovascular:     Rate and Rhythm: Normal rate and regular rhythm.     Heart sounds: Normal heart sounds. No murmur heard.    No friction rub. No gallop.  Pulmonary:     Effort: Pulmonary effort is normal. No respiratory distress.     Breath sounds: No stridor. Wheezing and rhonchi present. No rales.  Chest:     Chest wall: No tenderness.  Skin:    General: Skin is warm and dry.  Neurological:     General: No focal deficit present.     Mental Status: She is alert and oriented to person, place, and time.  Psychiatric:         Mood and Affect: Mood normal.        Behavior: Behavior normal.    DG Chest 2 View  Result Date: 05/30/2023 CLINICAL DATA:  Persistent cough.  Wheezing.  Low-grade fever EXAM: CHEST - 2 VIEW COMPARISON:  Chest radiographs 02/23/2023, 03/26/2022, 02/26/2022 02/16/2022; CT coronary arteries 04/02/2023, CT chest 06/14/2020 FINDINGS: Cardiac silhouette and mediastinal contours are within normal limits. There are again numerous calcified pleural plaques seen bilaterally. No definite new focal airspace opacity separate from these calcified pleural plaques. No pleural effusion pneumothorax. Mild-to-moderate multilevel degenerative disc changes of the thoracic spine. Mild T7 and moderate T9 vertebral body compression fractures are unchanged from 06/14/2020 CT. IMPRESSION: 1. No acute cardiopulmonary process. 2. Multiple bilateral calcified pleural plaques consistent with the sequela of asbestos related pleural disease. Electronically Signed   By: Neita Garnet M.D.   On: 05/30/2023 14:14    A 0.5mg -2.5mg  ipratropium albuterol nebulizer treatment was administered in clinic.  Wheezing and rhonchi subsequently resolved and patient had clear cardiopulmonary sounds.  Her pulse oximetry remained 88%-89% on room air.  Patient noted some improvement with her breathing.  Assessment and Plan :   PDMP not reviewed this encounter.  1. Chronic respiratory failure with hypoxia (HCC)   2. Pulmonary asbestosis (HCC)   3. Persistent cough    Had an extensive discussion with patient about the risks of acute on chronic respiratory failure with hypoxia.  Her daughter presents with her as well.  Discussed presenting to the emergency room for further management but the patient is absolutely refusing this.  Verbalizes understanding the risks of untreated acute on chronic respiratory failure with hypoxia.  In clinic, we provided her with a nebulizer machine that she could take home.  Recommended regular doing neb treatments.  She  is also to monitor her pulse oximetry at home and restart her oxygen as previously prescribed.  Maintain strict ER precautions.  Her daughter contracts for safety and will monitor her through the weekend.   Wallis Bamberg, [place] 05/30/23 [id number]

## 2023-05-30 NOTE — ED Triage Notes (Addendum)
Pt c/o cough, wheezing, low grade fever-sx x 2 months-[facility] visits x 2 with 2 rounds abx and prednisone-DOE/some SHOB noted when answering questions-steady gait-daughter with pt

## 2023-06-02 ENCOUNTER — Emergency Department (HOSPITAL_COMMUNITY)
Admission: EM | Admit: 2023-06-02 | Discharge: 2023-06-03 | Disposition: A | Discharge status: Home / Self Care | Payer: Medicare Other | Attending: Emergency Medicine | Admitting: Emergency Medicine

## 2023-06-02 ENCOUNTER — Emergency Department (HOSPITAL_COMMUNITY): Payer: Medicare Other

## 2023-06-02 ENCOUNTER — Other Ambulatory Visit: Payer: Self-pay

## 2023-06-02 ENCOUNTER — Encounter (HOSPITAL_COMMUNITY): Payer: Self-pay

## 2023-06-02 DIAGNOSIS — J449 Chronic obstructive pulmonary disease, unspecified: Secondary | ICD-10-CM | POA: Diagnosis not present

## 2023-06-02 DIAGNOSIS — Z7982 Long term (current) use of aspirin: Secondary | ICD-10-CM | POA: Insufficient documentation

## 2023-06-02 DIAGNOSIS — R5383 Other fatigue: Secondary | ICD-10-CM | POA: Diagnosis not present

## 2023-06-02 DIAGNOSIS — Z7951 Long term (current) use of inhaled steroids: Secondary | ICD-10-CM | POA: Diagnosis not present

## 2023-06-02 DIAGNOSIS — I5032 Chronic diastolic (congestive) heart failure: Secondary | ICD-10-CM | POA: Insufficient documentation

## 2023-06-02 DIAGNOSIS — I110 Hypertensive heart disease with heart failure: Secondary | ICD-10-CM | POA: Diagnosis not present

## 2023-06-02 DIAGNOSIS — Z79899 Other long term (current) drug therapy: Secondary | ICD-10-CM | POA: Diagnosis not present

## 2023-06-02 DIAGNOSIS — J9611 Chronic respiratory failure with hypoxia: Secondary | ICD-10-CM | POA: Insufficient documentation

## 2023-06-02 DIAGNOSIS — J45909 Unspecified asthma, uncomplicated: Secondary | ICD-10-CM | POA: Insufficient documentation

## 2023-06-02 DIAGNOSIS — I2510 Atherosclerotic heart disease of native coronary artery without angina pectoris: Secondary | ICD-10-CM | POA: Insufficient documentation

## 2023-06-02 DIAGNOSIS — R062 Wheezing: Secondary | ICD-10-CM | POA: Diagnosis not present

## 2023-06-02 DIAGNOSIS — J9621 Acute and chronic respiratory failure with hypoxia: Secondary | ICD-10-CM | POA: Diagnosis not present

## 2023-06-02 DIAGNOSIS — R0602 Shortness of breath: Secondary | ICD-10-CM | POA: Diagnosis not present

## 2023-06-02 DIAGNOSIS — R0600 Dyspnea, unspecified: Secondary | ICD-10-CM

## 2023-06-02 DIAGNOSIS — R531 Weakness: Secondary | ICD-10-CM | POA: Diagnosis not present

## 2023-06-02 DIAGNOSIS — R0689 Other abnormalities of breathing: Secondary | ICD-10-CM | POA: Diagnosis not present

## 2023-06-02 LAB — CBC WITH DIFFERENTIAL/PLATELET
Abs Immature Granulocytes: 0.01 10*3/uL (ref 0.00–0.07)
Basophils Absolute: 0 10*3/uL (ref 0.0–0.1)
Basophils Relative: 1 %
Eosinophils Absolute: 0.2 10*3/uL (ref 0.0–0.5)
Eosinophils Relative: 3 %
HCT: 42.1 % (ref 36.0–46.0)
Hemoglobin: 13.2 g/dL (ref 12.0–15.0)
Immature Granulocytes: 0 %
Lymphocytes Relative: 44 %
Lymphs Abs: 3.4 10*3/uL (ref 0.7–4.0)
MCH: 27.8 pg (ref 26.0–34.0)
MCHC: 31.4 g/dL (ref 30.0–36.0)
MCV: 88.8 fL (ref 80.0–100.0)
Monocytes Absolute: 0.5 10*3/uL (ref 0.1–1.0)
Monocytes Relative: 7 %
Neutro Abs: 3.4 10*3/uL (ref 1.7–7.7)
Neutrophils Relative %: 45 %
Platelets: 217 10*3/uL (ref 150–400)
RBC: 4.74 MIL/uL (ref 3.87–5.11)
RDW: 13.3 % (ref 11.5–15.5)
WBC: 7.6 10*3/uL (ref 4.0–10.5)
nRBC: 0 % (ref 0.0–0.2)

## 2023-06-02 LAB — COMPREHENSIVE METABOLIC PANEL
ALT: 12 U/L (ref 0–44)
AST: 19 U/L (ref 15–41)
Albumin: 3.8 g/dL (ref 3.5–5.0)
Alkaline Phosphatase: 62 U/L (ref 38–126)
Anion gap: 10 (ref 5–15)
BUN: 17 mg/dL (ref 8–23)
CO2: 25 mmol/L (ref 22–32)
Calcium: 8.6 mg/dL — ABNORMAL LOW (ref 8.9–10.3)
Chloride: 102 mmol/L (ref 98–111)
Creatinine, Ser: 0.87 mg/dL (ref 0.44–1.00)
GFR, Estimated: >60 mL/min (ref >60)
Glucose, Bld: 97 mg/dL (ref 70–99)
Potassium: 3.1 mmol/L — ABNORMAL LOW (ref 3.5–5.1)
Sodium: 137 mmol/L (ref 135–145)
Total Bilirubin: 0.5 mg/dL (ref 0.3–1.2)
Total Protein: 8 g/dL (ref 6.5–8.1)

## 2023-06-02 LAB — TROPONIN I (HIGH SENSITIVITY)
Troponin I (High Sensitivity): 4 ng/L (ref <18)
Troponin I (High Sensitivity): 3 ng/L (ref <18)

## 2023-06-02 LAB — BRAIN NATRIURETIC PEPTIDE: B Natriuretic Peptide: 103.4 pg/mL — ABNORMAL HIGH (ref 0.0–100.0)

## 2023-06-02 MED ORDER — ALBUTEROL SULFATE (2.5 MG/3ML) 0.083% IN NEBU
INHALATION_SOLUTION | RESPIRATORY_TRACT | Status: AC
  Administered 2023-06-02: 5 mg
  Filled 2023-06-02: qty 6

## 2023-06-02 MED ORDER — ALBUTEROL SULFATE (2.5 MG/3ML) 0.083% IN NEBU: 5.0000 mg | INHALATION_SOLUTION | Freq: Once | RESPIRATORY_TRACT | Status: DC

## 2023-06-02 NOTE — Discharge Instructions (Signed)
Follow-up with your pulmonologist tomorrow as planned.

## 2023-06-02 NOTE — ED Triage Notes (Addendum)
Patient BIB [facility] from home. Has been short of breath all day. History of COPD, CHF. Breathing 40 times a minute. Oxygen was 90% room air on arrival. Has had a cough for 4 days. Lethargic and tripod breathing on arrival. 3L [place] baseline.  EMS 125mg  solumedrol 10 albuterol 2G Magnesium

## 2023-06-02 NOTE — ED Provider Notes (Signed)
L[name] EMERGENCY DEPARTMENT AT [facility] Provider Note   CSN: [id number] Arrival date & time: 06/02/23  1616     History  Chief Complaint  Patient presents with   Shortness of Breath    Linda [name] is a 77 y.o. female.   Shortness of Breath Patient presents with shortness of breath.  History of COPD and reported CHF.  Tachypnea.  Reportedly sats of 90% upon arrival.  Has had 4 days of cough.  Reportedly lethargic and tripod breathing on arrival.  Is on 3 L at baseline.  EMS gave Solu-Medrol 10 of albuterol and 2 g magnesium.  On BiPAP when I see her in the room.  Somewhat difficult to get history due to the BiPAP and dyspnea.    Past Medical History:  Diagnosis Date   Acute upper GI bleed 06/2014   while pt taking coumadin, plavix, and meloxicam---despite being told not to take coumadin.   Anginal pain (HCC)    Nonobstructive CAD 2014; however, her cardiologist put her on a statin for this and NOT for hyperlipidemia per pt report.  Atyp CP 08/2017 at card f/u, plan for myoc perf imaging.   Anxiety    panic attacks   Asthma    w/ asbestososis    BPPV (benign paroxysmal positional vertigo) 12/16/2012   CAD (coronary artery disease)    Nonobstructive ([facility])   Chronic diastolic CHF (congestive heart failure) (HCC)    dry wt as of 11/06/16 is 168 lbs.   Chronic lower back pain    COPD (chronic obstructive pulmonary disease) (HCC)    DDD (degenerative disc disease)    lumbar and cervical.    Diverticular disease    Fibromyalgia    Patient states dx was around her late 59s but she had sx's for years prior to this.   H/O hiatal hernia    History of pneumonia    hospitalized 12/2011, 02/2013, and 07/2013 [facility]) for this   HTN (hypertension)    Renal artery dopplers 04/2013 neg for stenosis.   Hypervitaminosis D 09/27/2019   over-supplemented.  Stopped vit D and plan recheck 2 mo.   Idiopathic angio-edema-urticaria 30865; 2021   Angioedema component  was very minimal.  2021->Dr. Bobbitt (allergist) eval.   Insomnia    Iron deficiency anemia    Hematologist in [place], [place] did extensive w/u; no cause found; failed oral supplement;; gets fairly regular (q24m or so) IV iron infusions (Venofer -iron sucrose- 200mg  with procrit.  "for 14 yr I've been getting blood work q month & getting infusions prn" (07/12/2013).  Dr. Myna Hidalgo locally, iron infusions done, EPO deficiency dx'd   Kidney stone    Migraine syndrome    "not as often anymore; used to be ~ q wk" (07/12/2013)   Mixed incontinence urge and stress    Nephrolithiasis    "passed all on my own or they are still in there" (07/12/2013)   Neuroleptic induced parkinsonism (HCC) 2018   Dr. Arbutus Leas, neuro, saw her 11/24/17 and recommended d/c of abilify as first step.  D/c'd abilify and pt got complete recovery.   Oropharyngeal dysphagia    swallowing study speech path 05/2020. Gastric bx's showed gastritis, h pylori NEG   OSA on CPAP    prior to move to [place]--had another sleep study 10/2015 w/pulm Dr. Su Monks.   Osteoarthritis    "severe; progressing fast" (07/12/2013); multiple joints-not surgical candidate for TKR (03/2015).  Triamcinolon knee injections by Dr. Hermelinda Medicus 12/2017.  Pernicious anemia 08/24/2014   Pleural plaque with presence of asbestos 07/22/2013   Pulmonary embolism (HCC) 07/2013   Dx at [facility] with very small peripheral upper lobe pe 07/2013: pt took coumadin for about 8-9 mo   Pyelonephritis    "several times over the last 30 yr" (07/12/2013)   RBBB (right bundle branch block)    Recurrent major depression (HCC)    Recurrent UTI    hx of hospitalization for pyelonephritis; started abx prophylaxis 06/2015   Syncope    Hypotensive; ED visit--Dr. Sharyn Lull did Cath--nonobstructive CAD, EF 55-60%.  In retrospect, suspect pt rx med misuse/polypharmacy   Vertebral compression fracture (HCC)    Acute T12 on 02/27/22 (fall).  multiple old thoracic-->neurosurg to do MRI as of 05/2022    Home  Medications Prior to Admission medications   Medication Sig Start Date End Date Taking? Authorizing Provider  albuterol (VENTOLIN HFA) 108 (90 Base) MCG/ACT inhaler Inhale 1-2 puffs into the lungs every 6 (six) hours as needed for wheezing or shortness of breath. 01/01/23  Yes McGowen, Maryjean Morn, MD  ALPRAZolam Prudy Feeler) 0.5 MG tablet TAKE ONE TABLET BY MOUTH THREE TIMES DAILY 02/24/23  Yes McGowen, Maryjean Morn, MD  amLODipine (NORVASC) 5 MG tablet TAKE ONE TABLET BY MOUTH DAILY 02/24/23  Yes McGowen, Maryjean Morn, MD  aspirin 81 MG tablet Take 81 mg by mouth at bedtime.   Yes [provider]  cyclobenzaprine (FLEXERIL) 5 MG tablet Take 1 tablet (5 mg total) by mouth 2 (two) times daily as needed. Patient taking differently: Take 5 mg by mouth 2 (two) times daily as needed for muscle spasms. 05/01/23  Yes McGowen, Maryjean Morn, MD  diclofenac Sodium (VOLTAREN) 1 % GEL Apply 2 g topically 4 (four) times daily. 4 times a day to both knees Patient taking differently: Apply 2 g topically 4 (four) times daily as needed (pain). 08/07/22  Yes Ranelle Oyster, MD  DULoxetine (CYMBALTA) 60 MG capsule Take 1 capsule (60 mg total) by mouth daily. 04/10/23  Yes McGowen, Maryjean Morn, MD  EPINEPHrine 0.3 mg/0.3 mL IJ SOAJ injection Inject 0.3 mg into the muscle as needed for anaphylaxis. 12/28/20  Yes McGowen, Maryjean Morn, MD  ipratropium (ATROVENT) 0.03 % nasal spray Place 2 sprays into both nostrils every 12 (twelve) hours as needed (allergies). 01/01/23  Yes McGowen, Maryjean Morn, MD  ipratropium-albuterol (DUONEB) 0.5-2.5 (3) MG/3ML SOLN Take 3 mLs by nebulization every 4 (four) hours as needed. Patient taking differently: Take 3 mLs by nebulization every 4 (four) hours as needed (sob/wheezing). 05/30/23  Yes Wallis Bamberg, PA-C  isosorbide mononitrate (IMDUR) 30 MG 24 hr tablet Take 2 tablets (60 mg total) by mouth in the morning and at bedtime. 04/30/23  Yes Monge, Petra Kuba, NP  lamoTRIgine (LAMICTAL) 200 MG tablet Take 1 tablet (200 mg  total) by mouth daily. 01/01/23  Yes McGowen, Maryjean Morn, MD  metoprolol succinate (TOPROL-XL) 100 MG 24 hr tablet take ONE tab daily, take additional 1/2 tab as needed FOR FOR BLOOD PRESSURE > 160/90 Patient taking differently: Take 50-100 mg by mouth as directed. take ONE tab daily, take additional 1/2 tab as needed FOR FOR BLOOD PRESSURE > 160/90 01/01/23  Yes McGowen, Maryjean Morn, MD  Multiple Vitamin (MULTIVITAMIN WITH MINERALS) TABS tablet Take 1 tablet by mouth daily.   Yes [provider]  nitroGLYCERIN (NITROSTAT) 0.4 MG SL tablet Place 1 tablet (0.4 mg total) under the tongue every 5 (five) minutes as needed for chest pain (x  3 doses). 01/08/23  Yes Lewayne Bunting, MD  oxyCODONE-acetaminophen (PERCOCET) 10-325 MG tablet Take 1 tablet by mouth every 6 (six) hours as needed for pain. 04/16/23  Yes Ranelle Oyster, MD  pantoprazole (PROTONIX) 40 MG tablet Take 1 tablet (40 mg total) by mouth 2 (two) times daily. 01/01/23  Yes McGowen, Maryjean Morn, MD  rOPINIRole (REQUIP) 0.5 MG tablet TAKE ONE TABLET BY MOUTH AT BEDTIME 01/03/23  Yes McGowen, Maryjean Morn, MD  rosuvastatin (CRESTOR) 40 MG tablet Take 1 tablet (40 mg total) by mouth every evening. 04/02/23  Yes Crenshaw, Madolyn Frieze, MD  solifenacin (VESICARE) 10 MG tablet TAKE ONE TABLET BY MOUTH ONCE DAILY **NEED OFFICE VISIT** FOR FURTHER REFILLS 05/12/23  Yes McGowen, Maryjean Morn, MD  traZODone (DESYREL) 50 MG tablet take 2-4 tablets at bedtime as needed for insomnia. Patient taking differently: Take 100-200 mg by mouth at bedtime as needed for sleep. 12/09/22  Yes McGowen, Maryjean Morn, MD  metoprolol tartrate (LOPRESSOR) 50 MG tablet Take 2 hours prior to CT scan Patient not taking: Reported on 06/02/2023 01/22/23   Lewayne Bunting, MD      Allergies    Aripiprazole, Penicillins, and Bactrim [sulfamethoxazole-trimethoprim]    Review of Systems   Review of Systems  Respiratory:  Positive for shortness of breath.     Physical Exam Updated Vital  Signs BP (!) 147/80   Pulse 82   Temp 98.4 F (36.9 C) (Oral)   Resp (!) 24   Ht 5\' 4"  (1.626 m)   Wt 65 kg   SpO2 99%   BMI 24.60 kg/m  Physical Exam Vitals and nursing note reviewed.  Cardiovascular:     Rate and Rhythm: Regular rhythm.  Pulmonary:     Comments: Tachypnea and somewhat diffuse harsh breath sounds with wheezes.  Patient is on BiPAP. Musculoskeletal:     Right lower leg: No edema.     Left lower leg: No edema.  Neurological:     Mental Status: She is alert.     ED Results / Procedures / Treatments   Labs (all labs ordered are listed, but only abnormal results are displayed) Labs Reviewed  COMPREHENSIVE METABOLIC PANEL - Abnormal; Notable for the following components:      Result Value   Potassium 3.1 (*)    Calcium 8.6 (*)    All other components within normal limits  BRAIN NATRIURETIC PEPTIDE - Abnormal; Notable for the following components:   B Natriuretic Peptide 103.4 (*)    All other components within normal limits  CBC WITH DIFFERENTIAL/PLATELET  TROPONIN I (HIGH SENSITIVITY)  TROPONIN I (HIGH SENSITIVITY)    EKG None  Radiology DG Chest Portable 1 View  Result Date: 06/02/2023 CLINICAL DATA:  Shortness of breath. EXAM: PORTABLE CHEST 1 VIEW COMPARISON:  Radiographs 05/30/2023. FINDINGS: Stable heart size and mediastinal contours. Stable appearance of bilateral calcified pleural plaques. No evidence of acute airspace disease, large pleural effusion or pneumothorax. No pulmonary edema. IMPRESSION: 1. Stable radiographic appearance of the chest.  No acute findings. 2. Stable bilateral calcified pleural plaques. Electronically Signed   By: Narda Rutherford M.D.   On: 06/02/2023 17:57    Procedures Procedures    Medications Ordered in ED Medications  albuterol (PROVENTIL) (2.5 MG/3ML) 0.083% nebulizer solution 5 mg (0 mg Nebulization Hold 06/02/23 1639)  albuterol (PROVENTIL) (2.5 MG/3ML) 0.083% nebulizer solution (5 mg  Given by Other 06/02/23  1638)    ED Course/ Medical Decision Making/ A&P  Medical Decision Making Amount and/or Complexity of Data Reviewed Labs: ordered. Radiology: ordered.  Risk Prescription drug management.   Patient shortness of breath.  History of COPD and chronic respiratory failure on oxygen.  Has hypoxia prior to arrival.  Tachypnea.  On BiPAP now.  Differential diagnose includes pneumonia, COPD, CHF.  Will get basic blood work and x-ray.  Blood work basically at baseline.  Breathing is improved.  Feeling much better.  Down to baseline oxygen.  Discussed with patient on admission.  My recommendation was for admission since has had episode on Friday and then again today, which is Monday.  However patient does not want admission.  There was a delay and walking her with a pulse ox and was unable to get done expeditiously and she requested just discharge.  She does have follow-up with pulmonology tomorrow.  I think this is reasonable.  She is at her baseline oxygen.  Will discharge.  CRITICAL CARE Performed by: Benjiman Core Total critical care time: 30 minutes Critical care time was exclusive of separately billable procedures and treating other patients. Critical care was necessary to treat or prevent imminent or life-threatening deterioration. Critical care was time spent personally by me on the following activities: development of treatment plan with patient and/or surrogate as well as nursing, discussions with consultants, evaluation of patient's response to treatment, examination of patient, obtaining history from patient or surrogate, ordering and performing treatments and interventions, ordering and review of laboratory studies, ordering and review of radiographic studies, pulse oximetry and re-evaluation of patient's condition.         Final Clinical Impression(s) / ED Diagnoses Final diagnoses:  Dyspnea, unspecified type  Chronic respiratory failure with  hypoxia [facility])    Rx / DC Orders ED Discharge Orders     None         Benjiman Core, MD 06/03/23 0000

## 2023-06-02 NOTE — Progress Notes (Signed)
   06/02/23 1848  BiPAP/CPAP/SIPAP  BiPAP/CPAP/SIPAP Pt Type Adult  BiPAP/CPAP/SIPAP V60  Reason BIPAP/CPAP not in use (S)  Other(comment) (Placed the bipap on standby, agreed with MD to allow pt to take a break from the bipap, she has improved, placed on 3 L [place] (home regimen) Sp02 100.)

## 2023-06-02 NOTE — Progress Notes (Signed)
   06/02/23 1623  BiPAP/CPAP/SIPAP  $ Non-Invasive Ventilator  Non-Invasive Vent Set Up;Non-Invasive Vent Initial  $ Face Mask Medium Yes  BiPAP/CPAP/SIPAP Pt Type Adult  BiPAP/CPAP/SIPAP (S)  V60  Mask Type Full face mask  Mask Size Medium  Set Rate (S)  12 breaths/min  Respiratory Rate 36 breaths/min  IPAP (S)  10 cmH20  EPAP (S)  5 cmH2O  FiO2 (%) (S)  35 % (Pt stated that she wears 3 L [place] @ home, 32%.)  Flow Rate  (Rise 3, I Time 1.00)  Minute Ventilation 15.5  Leak 10  Peak Inspiratory Pressure (PIP) 11  Tidal Volume (Vt) 475  Patient Home Equipment No  Auto Titrate No  Press High Alarm 35 cmH2O  Press Low Alarm 5 cmH2O  Nasal massage performed No (comment)  CPAP/SIPAP surface wiped down Yes  BiPAP/CPAP /SiPAP Vitals  Pulse Rate 73  Resp (!) 36  SpO2 100 %  Bilateral Breath Sounds Expiratory wheezes;Inspiratory wheezes;Fine crackles;Diminished  MEWS Score/Color  MEWS Score 3  MEWS Score Color Yellow

## 2023-06-02 NOTE — ED Notes (Signed)
[facility] called for transportation to [facility] due to O2 demand.

## 2023-06-03 ENCOUNTER — Ambulatory Visit: Payer: Self-pay

## 2023-06-03 DIAGNOSIS — Z7401 Bed confinement status: Secondary | ICD-10-CM | POA: Diagnosis not present

## 2023-06-03 DIAGNOSIS — G4733 Obstructive sleep apnea (adult) (pediatric): Secondary | ICD-10-CM | POA: Diagnosis not present

## 2023-06-03 DIAGNOSIS — R531 Weakness: Secondary | ICD-10-CM | POA: Diagnosis not present

## 2023-06-03 DIAGNOSIS — J9611 Chronic respiratory failure with hypoxia: Secondary | ICD-10-CM | POA: Diagnosis not present

## 2023-06-03 DIAGNOSIS — I10 Essential (primary) hypertension: Secondary | ICD-10-CM | POA: Diagnosis not present

## 2023-06-03 DIAGNOSIS — I5032 Chronic diastolic (congestive) heart failure: Secondary | ICD-10-CM | POA: Diagnosis not present

## 2023-06-03 NOTE — Progress Notes (Unsigned)
Virtual Visit via Telephone Note   Because of Linda [name]'s co-morbid illnesses, she is at least at moderate risk for complications without adequate follow up.  This format is felt to be most appropriate for this patient at this time.  The patient did not have access to video technology/had technical difficulties with video requiring transitioning to audio format only (telephone).  All issues noted in this document were discussed and addressed.  No physical exam could be performed with this format.  Please refer to the patient's chart for her consent to telehealth for [facility].  Evaluation Performed:  Preoperative cardiovascular risk assessment _____________   Date:  06/03/2023   Patient ID:  Linda [name], DOB 1946-08-01, MRN [id number] Patient Location:  Home Provider location:   Office  Primary Care Provider:  Jeoffrey Massed, MD Primary Cardiologist:  Olga Millers, MD  Chief Complaint / Patient Profile   77 y.o. y/o female with a h/o COPD, chronic respiratory failure on O2, CAD with nonobstructive CAD by cath 2014, elevated coronary calcium score of 1315, chronic chest pain, chronic HFpEF, carotid artery stenosis, HTN, and HLD who is pending right total knee arthroplasty and presents today for telephonic preoperative cardiovascular risk assessment.  History of Present Illness    Linda [name] is a 77 y.o. female who presents via audio/video conferencing for a telehealth visit today.  Pt was last seen in cardiology clinic on 04/30/23 by Bernadene Person, NP.  At that time Linda [name] was having intermittent chest pain and Imdur was increased to 120 mg daily.  The patient is now pending procedure as outlined above. Since her last visit, she  Recent ED admission for acute on chronic respiratory failure. I advised that we should postpone this visit as she is unable to speak more than a few words without coughing. Advised that she get pulmonary clearance  prior to seeking cardiac clearance. We will update requesting office.   Past Medical History    Past Medical History:  Diagnosis Date   Acute upper GI bleed 06/2014   while pt taking coumadin, plavix, and meloxicam---despite being told not to take coumadin.   Anginal pain (HCC)    Nonobstructive CAD 2014; however, her cardiologist put her on a statin for this and NOT for hyperlipidemia per pt report.  Atyp CP 08/2017 at card f/u, plan for myoc perf imaging.   Anxiety    panic attacks   Asthma    w/ asbestososis    BPPV (benign paroxysmal positional vertigo) 12/16/2012   CAD (coronary artery disease)    Nonobstructive ([facility])   Chronic diastolic CHF (congestive heart failure) (HCC)    dry wt as of 11/06/16 is 168 lbs.   Chronic lower back pain    COPD (chronic obstructive pulmonary disease) (HCC)    DDD (degenerative disc disease)    lumbar and cervical.    Diverticular disease    Fibromyalgia    Patient states dx was around her late 27s but she had sx's for years prior to this.   H/O hiatal hernia    History of pneumonia    hospitalized 12/2011, 02/2013, and 07/2013 [facility]) for this   HTN (hypertension)    Renal artery dopplers 04/2013 neg for stenosis.   Hypervitaminosis D 09/27/2019   over-supplemented.  Stopped vit D and plan recheck 2 mo.   Idiopathic angio-edema-urticaria 40981; 2021   Angioedema component was very minimal.  2021->Dr. Bobbitt (allergist) eval.   Insomnia  Iron deficiency anemia    Hematologist in [place], [place] did extensive w/u; no cause found; failed oral supplement;; gets fairly regular (q43m or so) IV iron infusions (Venofer -iron sucrose- 200mg  with procrit.  "for 14 yr I've been getting blood work q month & getting infusions prn" (07/12/2013).  Dr. Myna Hidalgo locally, iron infusions done, EPO deficiency dx'd   Kidney stone    Migraine syndrome    "not as often anymore; used to be ~ q wk" (07/12/2013)   Mixed incontinence urge and stress     Nephrolithiasis    "passed all on my own or they are still in there" (07/12/2013)   Neuroleptic induced parkinsonism (HCC) 2018   Dr. Arbutus Leas, neuro, saw her 11/24/17 and recommended d/c of abilify as first step.  D/c'd abilify and pt got complete recovery.   Oropharyngeal dysphagia    swallowing study speech path 05/2020. Gastric bx's showed gastritis, h pylori NEG   OSA on CPAP    prior to move to [place]--had another sleep study 10/2015 w/pulm Dr. Su Monks.   Osteoarthritis    "severe; progressing fast" (07/12/2013); multiple joints-not surgical candidate for TKR (03/2015).  Triamcinolon knee injections by Dr. Hermelinda Medicus 12/2017.   Pernicious anemia 08/24/2014   Pleural plaque with presence of asbestos 07/22/2013   Pulmonary embolism (HCC) 07/2013   Dx at [facility] with very small peripheral upper lobe pe 07/2013: pt took coumadin for about 8-9 mo   Pyelonephritis    "several times over the last 30 yr" (07/12/2013)   RBBB (right bundle branch block)    Recurrent major depression (HCC)    Recurrent UTI    hx of hospitalization for pyelonephritis; started abx prophylaxis 06/2015   Syncope    Hypotensive; ED visit--Dr. Sharyn Lull did Cath--nonobstructive CAD, EF 55-60%.  In retrospect, suspect pt rx med misuse/polypharmacy   Vertebral compression fracture (HCC)    Acute T12 on 02/27/22 (fall).  multiple old thoracic-->neurosurg to do MRI as of 05/2022   Past Surgical History:  Procedure Laterality Date   APPENDECTOMY  1960   AXILLARY SURGERY Left 1978   Multiple "lump" in armpit per pt   BIOPSY  06/17/2020   Gastric bx->gastritis, h pylori neg.  Procedure: BIOPSY;  Surgeon: Jeani Hawking, MD;  Location: [facility];  Service: Endoscopy;;   CARDIAC CATHETERIZATION  01/2013   nonobstructive CAD, EF 55-60%   CARDIOVASCULAR STRESS TEST  02/22/2015   Low risk myocard perf imaging; wall motion normal, normal EF   CARDIOVASCULAR STRESS TEST     09/2017 myo perf low risk   carotid duplex doppler  10/21/2017   01/2023  1-30% bilat-->rpt 1 yr   COCCYX REMOVAL  1972   CT CTA CORONARY W/CA SCORE W/CM &/OR WO/CM     04/02/23 96%'tile calcium score, nonobst CAD-->crestor increased to 40   DEXA  06/05/2017   T-score -3.1   DILATION AND CURETTAGE OF UTERUS  ? 1970's   ESOPHAGOGASTRODUODENOSCOPY N/A 07/19/2014   Gastritis found + in the setting of supratherapeutic INR, +plavix, + meloxicam.   ESOPHAGOGASTRODUODENOSCOPY (EGD) WITH PROPOFOL N/A 06/17/2020   NO stricture or other prob to explain pt's dysphagia, dilation was done anyway.  Gastric bx's-->gastritis, h pylori neg. Procedure: ESOPHAGOGASTRODUODENOSCOPY (EGD) WITH PROPOFOL;  Surgeon: Jeani Hawking, MD;  Location: [facility];  Service: Endoscopy;  Laterality: N/A;   EYE SURGERY Left 2012-2013   "injections for ~ 1 yr; don't really know what for" (07/12/2013)   HEEL SPUR SURGERY Left 2008   kidney stone removal Right  KNEE SURGERY  2005   LEFT HEART CATHETERIZATION WITH CORONARY ANGIOGRAM N/A 01/30/2013   Procedure: LEFT HEART CATHETERIZATION WITH CORONARY ANGIOGRAM;  Surgeon: Robynn Pane, MD;  Location: [facility] CATH LAB;  Service: Cardiovascular;  Laterality: N/A;   MALONEY DILATION  06/17/2020   Procedure: Elease Hashimoto DILATION;  Surgeon: Jeani Hawking, MD;  Location: [facility];  Service: Endoscopy;;   PLANTAR FASCIA RELEASE Left 2008   SPIROMETRY  04/25/2014   In hosp for acute asthma/COPD flare: mixed obstructive and restrictive lung disease. The FEV1 is severely reduced at 45% predicted.  FEV1 signif decreased compared to prior spirometry 07/23/13.   TENDON RELEASE  1996   Right forearm and hand   TOTAL ABDOMINAL HYSTERECTOMY  1974   TRANSTHORACIC ECHOCARDIOGRAM  01/2013; 04/2014;08/2015; 09/2017   2014--NORMAL.  2015--focal basal septal hypertrophy, EF 55-60%, grade I diast dysfxn, mild LAE.  08/2015 EF 55-60%, nl LV syst fxn, grade I DD, valves wnl. 10/21/17: EF 65-70%, grd I DD, o/w normal. 02/17/22 EF 70-75%, hyperdynamic LV fxn, grd I DD.     Allergies  Allergies  Allergen Reactions   Aripiprazole Other (See Comments) and Rash    parkinsonism Other reaction(s): Other parkinsonism  parkinsonism parkinsonism   Penicillins Itching, Swelling, Rash and Shortness Of Breath    Tolerated Cefepime in ED. Has patient had a PCN reaction causing immediate rash, facial/tongue/throat swelling, SOB or lightheadedness with hypotension: Yes Has patient had a PCN reaction causing severe rash involving mucus membranes or skin necrosis: No Has patient had a PCN reaction that required hospitalization: No  Has patient had a PCN reaction occurring within the last 10 years: No  Tolerated Cefepime in ED. Has patient had a PCN reaction causing immediate rash, facial/tongue/throat swelling, SOB or lightheadedness with hypotension: Yes Has patient had a PCN reaction causing severe rash involving mucus membranes or skin necrosis: No Has patient had a PCN reaction that required hospitalization: No  Has patient had a PCN reaction occurring within the last 10 years: No Rash,swelling Tolerated Cefepime in ED.  Rash,swelling  Tolerated Cefepime in ED.  Tolerated Cefepime in ED. Has patient had a PCN reaction causing immediate rash, facial/tongue/throat swelling, SOB or lightheadedness with hypotension: Yes Has patient had a PCN reaction causing severe rash involving mucus membranes or skin necrosis: No Has patient had a PCN reaction that required hospitalization: No  Has patient had a PCN reaction occurring within the last 10 years: No   Bactrim [Sulfamethoxazole-Trimethoprim] Rash    Home Medications    Prior to Admission medications   Medication Sig Start Date End Date Taking? Authorizing Provider  albuterol (VENTOLIN HFA) 108 (90 Base) MCG/ACT inhaler Inhale 1-2 puffs into the lungs every 6 (six) hours as needed for wheezing or shortness of breath. 01/01/23   McGowen, Maryjean Morn, MD  ALPRAZolam Prudy Feeler) 0.5 MG tablet TAKE ONE TABLET BY MOUTH THREE  TIMES DAILY 02/24/23   McGowen, Maryjean Morn, MD  amLODipine (NORVASC) 5 MG tablet TAKE ONE TABLET BY MOUTH DAILY 02/24/23   McGowen, Maryjean Morn, MD  aspirin 81 MG tablet Take 81 mg by mouth at bedtime.    [provider]  cyclobenzaprine (FLEXERIL) 5 MG tablet Take 1 tablet (5 mg total) by mouth 2 (two) times daily as needed. Patient taking differently: Take 5 mg by mouth 2 (two) times daily as needed for muscle spasms. 05/01/23   McGowen, Maryjean Morn, MD  diclofenac Sodium (VOLTAREN) 1 % GEL Apply 2 g topically 4 (four) times daily. 4 times  a day to both knees Patient taking differently: Apply 2 g topically 4 (four) times daily as needed (pain). 08/07/22   Ranelle Oyster, MD  DULoxetine (CYMBALTA) 60 MG capsule Take 1 capsule (60 mg total) by mouth daily. 04/10/23   McGowen, Maryjean Morn, MD  EPINEPHrine 0.3 mg/0.3 mL IJ SOAJ injection Inject 0.3 mg into the muscle as needed for anaphylaxis. 12/28/20   McGowen, Maryjean Morn, MD  ipratropium (ATROVENT) 0.03 % nasal spray Place 2 sprays into both nostrils every 12 (twelve) hours as needed (allergies). 01/01/23   McGowen, Maryjean Morn, MD  ipratropium-albuterol (DUONEB) 0.5-2.5 (3) MG/3ML SOLN Take 3 mLs by nebulization every 4 (four) hours as needed. Patient taking differently: Take 3 mLs by nebulization every 4 (four) hours as needed (sob/wheezing). 05/30/23   Wallis Bamberg, PA-C  isosorbide mononitrate (IMDUR) 30 MG 24 hr tablet Take 2 tablets (60 mg total) by mouth in the morning and at bedtime. 04/30/23   Joylene Grapes, NP  lamoTRIgine (LAMICTAL) 200 MG tablet Take 1 tablet (200 mg total) by mouth daily. 01/01/23   McGowen, Maryjean Morn, MD  metoprolol succinate (TOPROL-XL) 100 MG 24 hr tablet take ONE tab daily, take additional 1/2 tab as needed FOR FOR BLOOD PRESSURE > 160/90 Patient taking differently: Take 50-100 mg by mouth as directed. take ONE tab daily, take additional 1/2 tab as needed FOR FOR BLOOD PRESSURE > 160/90 01/01/23   McGowen, Maryjean Morn, MD  metoprolol  tartrate (LOPRESSOR) 50 MG tablet Take 2 hours prior to CT scan Patient not taking: Reported on 06/02/2023 01/22/23   Lewayne Bunting, MD  Multiple Vitamin (MULTIVITAMIN WITH MINERALS) TABS tablet Take 1 tablet by mouth daily.    [provider]  nitroGLYCERIN (NITROSTAT) 0.4 MG SL tablet Place 1 tablet (0.4 mg total) under the tongue every 5 (five) minutes as needed for chest pain (x 3 doses). 01/08/23   Lewayne Bunting, MD  oxyCODONE-acetaminophen (PERCOCET) 10-325 MG tablet Take 1 tablet by mouth every 6 (six) hours as needed for pain. 04/16/23   Ranelle Oyster, MD  pantoprazole (PROTONIX) 40 MG tablet Take 1 tablet (40 mg total) by mouth 2 (two) times daily. 01/01/23   McGowen, Maryjean Morn, MD  rOPINIRole (REQUIP) 0.5 MG tablet TAKE ONE TABLET BY MOUTH AT BEDTIME 01/03/23   McGowen, Maryjean Morn, MD  rosuvastatin (CRESTOR) 40 MG tablet Take 1 tablet (40 mg total) by mouth every evening. 04/02/23   Lewayne Bunting, MD  solifenacin (VESICARE) 10 MG tablet TAKE ONE TABLET BY MOUTH ONCE DAILY **NEED OFFICE VISIT** FOR FURTHER REFILLS 05/12/23   Jeoffrey Massed, MD  traZODone (DESYREL) 50 MG tablet take 2-4 tablets at bedtime as needed for insomnia. Patient taking differently: Take 100-200 mg by mouth at bedtime as needed for sleep. 12/09/22   McGowen, Maryjean Morn, MD    Physical Exam    Vital Signs:  Linda [name] does not have vital signs available for review today.  Given telephonic nature of communication, physical exam is limited. AAOx3. NAD. Normal affect.  Speech and respirations are unlabored.  Accessory Clinical Findings    None  Assessment & Plan    1.  Preoperative Cardiovascular Risk Assessment:  The patient was advised that if she develops new symptoms prior to surgery to contact our office to arrange for a follow-up visit, and she verbalized understanding.  A copy of this note will be routed to requesting surgeon.  Time:   Today, I have spent  minutes with the  patient with telehealth technology discussing medical history, symptoms, and management plan.     Levi Aland, NP-C  06/04/2023, 10:42 AM [address], [address] Office [phone] Fax [phone]

## 2023-06-03 NOTE — Patient Outreach (Signed)
  Care Coordination   06/03/2023 Name: Linda [name] MRN: [id number] DOB: May 08, 1946   Care Coordination Outreach Attempts:  An unsuccessful telephone outreach was attempted today to offer the patient information about available care coordination services.  Follow Up Plan:  Additional outreach attempts will be made to offer the patient care coordination information and services.   Encounter Outcome:  No Answer   Care Coordination Interventions:  No, not indicated    Bary Leriche, RN, MSN [facility] Care Management Care Management Coordinator Direct Line [phone]

## 2023-06-04 ENCOUNTER — Ambulatory Visit: Payer: Medicare Other | Attending: Cardiology | Admitting: Nurse Practitioner

## 2023-06-04 ENCOUNTER — Ambulatory Visit: Payer: Medicare Other | Admitting: Registered Nurse

## 2023-06-04 DIAGNOSIS — Z01810 Encounter for preprocedural cardiovascular examination: Secondary | ICD-10-CM

## 2023-06-04 DIAGNOSIS — M6281 Muscle weakness (generalized): Secondary | ICD-10-CM | POA: Diagnosis not present

## 2023-06-04 NOTE — Telephone Encounter (Signed)
SEE NOTES FROM PRE OP APP Eligha Bridegroom, NP: Recent ED admission for acute on chronic respiratory failure. I advised that we should postpone this visit as she is unable to speak more than a few words without coughing. Advised that she get pulmonary clearance prior to seeking cardiac clearance. We will update requesting office.   I will fax over notes from Eligha Bridegroom, NP from pre op today. Recommendations due to pt's condition at this time is to post pone surgery, see ov note sent over today with this note as well.   Once ready to reschedule surgery please fax over new request to cardiologist.

## 2023-06-04 NOTE — Progress Notes (Signed)
SEE NOTES FROM PRE OP APP Eligha Bridegroom, NP: Recent ED admission for acute on chronic respiratory failure. I advised that we should postpone this visit as she is unable to speak more than a few words without coughing. Advised that she get pulmonary clearance prior to seeking cardiac clearance. We will update requesting office.   I will fax over notes from Eligha Bridegroom, NP from pre op today. Recommendations due to pt's condition at this time is to post pone surgery, see ov note sent over today with this note as well.   Once ready to reschedule surgery please fax over new request to cardiologist.

## 2023-06-06 ENCOUNTER — Other Ambulatory Visit: Payer: Self-pay | Admitting: *Deleted

## 2023-06-06 DIAGNOSIS — R599 Enlarged lymph nodes, unspecified: Secondary | ICD-10-CM

## 2023-06-06 DIAGNOSIS — M6281 Muscle weakness (generalized): Secondary | ICD-10-CM | POA: Diagnosis not present

## 2023-06-09 ENCOUNTER — Telehealth: Payer: Self-pay

## 2023-06-09 DIAGNOSIS — M6281 Muscle weakness (generalized): Secondary | ICD-10-CM | POA: Diagnosis not present

## 2023-06-09 NOTE — Telephone Encounter (Signed)
Transition Care Management Unsuccessful Follow-up Telephone Call  Date of discharge and from where:  [facility]   Attempts:  1st Attempt  Reason for unsuccessful TCM follow-up call:  Left voice message   Engelbert Sevin [facility] Guide, [place] [phone] [address], [place], [place] [address] Phone: [phone] Email: [email or web address]       

## 2023-06-09 NOTE — Telephone Encounter (Signed)
Transition Care Management Unsuccessful Follow-up Telephone Call  Date of discharge and from where:  Linda [name]  Attempts:  2nd Attempt  Reason for unsuccessful TCM follow-up call:  Left voice message   Linda [name] [facility] Guide, [facility] [phone] [address] [place], [place], [place] [address] Phone: [phone] Email: [email or web address]

## 2023-06-10 ENCOUNTER — Telehealth: Payer: Self-pay

## 2023-06-10 NOTE — Patient Outreach (Signed)
  Care Coordination   Follow Up Visit Note   06/10/2023 Name: BRITTONY KOELKER MRN: [id number] DOB: [birth year]  RHIANNON BRIEN is a 77 y.o. year old female who sees McGowen, Maryjean Morn, MD for primary care. I spoke with  [name] by phone today.  What matters to the patients health and wellness today?  Managing respiratory failure.     Goals Addressed             This Visit's Progress    Managing Chronic Health Issues       Care Coordination Interventions: Discussed plans with patient for ongoing care management follow up and provided patient with direct contact information for care management team Provided education on importance of blood pressure control in management of CAD Provided education on Importance of limiting foods high in cholesterol Reviewed Importance of attending all scheduled provider appointments Provided patient with basic written and verbal COPD education on self care/management/and exacerbation prevention Advised patient to track and manage COPD triggers Advised patient to self assesses COPD action plan zone and make appointment with provider if in the yellow zone for 48 hours without improvement Evaluation of current treatment plan related to hypertension self management and patient's adherence to plan as established by provider Emotional Support Provided  Patient with recent ED visit for respiratory failure. Patient states she bargained not to stay in the hospital Patient constantly coughing on the phone.  Patient reports oxygen use constantly now and that she has had asbestos exposure all her life causing her lung issues. She also reports having nebulizer treatments and inhalers to help. She has been seen by pulmonology sine ED visit. She reports PFT scheduled and sleep study.  RN CM discussed COPD and management.  Patient really not wanting return to the hospital.         SDOH assessments and interventions completed:  Yes     Care  Coordination Interventions:  Yes, provided   Follow up plan: Follow up call scheduled for 06/20/23    Encounter Outcome:  Pt. Visit Completed   Bary Leriche, RN, MSN [facility] Care Management Care Management Coordinator Direct Line [phone]

## 2023-06-10 NOTE — Patient Instructions (Addendum)
Visit Information  Thank you for taking time to visit with me today. Please don't hesitate to contact me if I can be of assistance to you.   Following are the goals we discussed today:   Goals Addressed             This Visit's Progress    Managing Chronic Health Issues       Care Coordination Interventions: Discussed plans with patient for ongoing care management follow up and provided patient with direct contact information for care management team Provided education on importance of blood pressure control in management of CAD Provided education on Importance of limiting foods high in cholesterol Reviewed Importance of attending all scheduled provider appointments Provided patient with basic written and verbal COPD education on self care/management/and exacerbation prevention Advised patient to track and manage COPD triggers Advised patient to self assesses COPD action plan zone and make appointment with provider if in the yellow zone for 48 hours without improvement Evaluation of current treatment plan related to hypertension self management and patient's adherence to plan as established by provider Emotional Support Provided  Patient with recent ED visit for respiratory failure. Patient states she bargained not to stay in the hospital Patient constantly coughing on the phone.  Patient reports oxygen use constantly now and that she has had asbestos exposure all her life causing her lung issues. She also reports having nebulizer treatments and inhalers to help. She has been seen by pulmonology sine ED visit. She reports PFT scheduled and sleep study.  RN CM discussed COPD and management.  Patient really not wanting return to the hospital.         Our next appointment is by telephone on 06/19/23 at 2:00 pm  Please call the care guide team at [phone] if you need to cancel or reschedule your appointment.   If you are experiencing a Mental Health or Behavioral Health Crisis or  need someone to talk to, please call the Suicide and Crisis Lifeline: 988   Patient verbalizes understanding of instructions and care plan provided today and agrees to view in MyChart. Active MyChart status and patient understanding of how to access instructions and care plan via MyChart confirmed with patient.     The patient has been provided with contact information for the care management team and has been advised to call with any health related questions or concerns.   Bary Leriche, RN, MSN [facility] Care Management Care Management Coordinator Direct Line [phone]

## 2023-06-11 ENCOUNTER — Encounter: Payer: Self-pay | Admitting: Registered Nurse

## 2023-06-11 ENCOUNTER — Encounter: Payer: Medicare Other | Attending: Physical Medicine & Rehabilitation | Admitting: Registered Nurse

## 2023-06-11 VITALS — BP 131/72 | HR 69 | Ht 64.0 in | Wt 148.0 lb

## 2023-06-11 DIAGNOSIS — G8929 Other chronic pain: Secondary | ICD-10-CM | POA: Insufficient documentation

## 2023-06-11 DIAGNOSIS — M542 Cervicalgia: Secondary | ICD-10-CM | POA: Diagnosis not present

## 2023-06-11 DIAGNOSIS — M1711 Unilateral primary osteoarthritis, right knee: Secondary | ICD-10-CM | POA: Insufficient documentation

## 2023-06-11 DIAGNOSIS — R0602 Shortness of breath: Secondary | ICD-10-CM | POA: Insufficient documentation

## 2023-06-11 DIAGNOSIS — M25512 Pain in left shoulder: Secondary | ICD-10-CM | POA: Insufficient documentation

## 2023-06-11 DIAGNOSIS — Z5181 Encounter for therapeutic drug level monitoring: Secondary | ICD-10-CM | POA: Diagnosis not present

## 2023-06-11 DIAGNOSIS — M7062 Trochanteric bursitis, left hip: Secondary | ICD-10-CM | POA: Diagnosis not present

## 2023-06-11 DIAGNOSIS — Z79899 Other long term (current) drug therapy: Secondary | ICD-10-CM | POA: Diagnosis not present

## 2023-06-11 DIAGNOSIS — M25511 Pain in right shoulder: Secondary | ICD-10-CM | POA: Diagnosis not present

## 2023-06-11 DIAGNOSIS — G894 Chronic pain syndrome: Secondary | ICD-10-CM | POA: Insufficient documentation

## 2023-06-11 DIAGNOSIS — M6281 Muscle weakness (generalized): Secondary | ICD-10-CM | POA: Diagnosis not present

## 2023-06-11 DIAGNOSIS — M1712 Unilateral primary osteoarthritis, left knee: Secondary | ICD-10-CM | POA: Diagnosis not present

## 2023-06-11 DIAGNOSIS — M47816 Spondylosis without myelopathy or radiculopathy, lumbar region: Secondary | ICD-10-CM | POA: Insufficient documentation

## 2023-06-11 DIAGNOSIS — M7061 Trochanteric bursitis, right hip: Secondary | ICD-10-CM | POA: Diagnosis not present

## 2023-06-11 DIAGNOSIS — M546 Pain in thoracic spine: Secondary | ICD-10-CM | POA: Diagnosis not present

## 2023-06-11 DIAGNOSIS — R0902 Hypoxemia: Secondary | ICD-10-CM | POA: Diagnosis not present

## 2023-06-11 MED ORDER — OXYCODONE-ACETAMINOPHEN 10-325 MG PO TABS: 1.0000 | ORAL_TABLET | Freq: Four times a day (QID) | ORAL | 0 refills | Status: DC | PRN

## 2023-06-11 NOTE — Progress Notes (Signed)
Subjective:    Patient ID: Linda [name], female    DOB: [birth year], 77 y.o.   MRN: [id number]  HPI: Linda [name] is a 77 y.o. female who returns for follow up appointment for chronic pain and medication refill. She states her pain is located in her .neck, bilateral shoulders, upper- lower back pain, bilateral hips and bilateral knee pain.  She rates her pain 8. Her current exercise regime is walking with her walker short distances.  [name] arrived to office with oxygen desaturation in 80%,and Oxygen saturation was checked. She states her oxygen saturation at home has been running 885- 88%, she didn't call her pulmonologist. She reports she is SOB and wheezing. Oxygen saturation was re-checked, she refuses ED evaluation.   She went to Urgent care on 05/30/2023, note was reviewed. She was diagnosed with Chronic Respiratory failure with Hypoxia. She went to [facility] ED on 06/03/2023, via EMS and note was reviewed. She was diagnosed with Dyspnea She was seen by pulmonology on 06/03/2023, note was reviewed.   [name] refuses ED or Urgent Care evaluation, she was instructed to call her Pulmonologist, she verbalizes understanding. Dr Riley Kill also spoke with [name] and instructed her to call her Pulmonologist, she verbalizes understanding.   [name] Morphine equivalent is 60.00 MME.She  is also prescribed Alprazolam by Dr. Milinda Cave .We have discussed the black box warning of using opioids and benzodiazepines. I highlighted the dangers of using these drugs together and discussed the adverse events including respiratory suppression, overdose, cognitive impairment and importance of compliance with current regimen. We will continue to monitor and adjust as indicated.   Last UDS was Performed on  12/26/2022, it was consistent.      Pain Inventory Average Pain 8 Pain Right Now 8 My pain is constant, sharp, and aching  In the last 24 hours, has pain interfered with the  following? General activity 7 Relation with others 7 Enjoyment of life 8 What TIME of day is your pain at its worst? morning , daytime, and evening Sleep (in general) Fair  Pain is worse with: walking, bending, standing, and some activites Pain improves with: rest, medication, and injections Relief from Meds: 7  Family History  Problem Relation Age of Onset   Arthritis Mother    Kidney disease Mother    Heart disease Father    Stroke Father    Hypertension Father    Diabetes Father    Heart attack Father    Heart attack Paternal Grandmother    Diabetes Sister        one sister   Hypertension Sister    Asthma Sister    Hypertension Brother    Asthma Brother    Asthma Daughter    Multiple sclerosis Son    Social History   Socioeconomic History   Marital status: Widowed    Spouse name: Not on file   Number of children: 2   Years of education: Not on file   Highest education level: Not on file  Occupational History   Occupation: Retired    Comment: Runner, 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  Relocated to [place] 09/2012 to be closer to her son who has MS.   Husband d 2015--mesothelioma.   Occupation: former Runner, broadcasting/film/video.   Education: masters degree level.   No T/A/Ds.   Lives in [place], independent living.   Social Determinants of Health   Financial Resource Strain: Low Risk  (01/01/2023)   Overall Financial Resource Strain (CARDIA)    Difficulty of Paying Living Expenses: Not hard at all  Food Insecurity: No Food Insecurity (04/08/2023)   Hunger Vital Sign    Worried About Running Out of Food in the Last Year:  Never true    Ran Out of Food in the Last Year: Never true  Transportation Needs: No Transportation Needs (04/23/2023)   PRAPARE - Administrator, Civil Service (Medical): No    Lack of Transportation (Non-Medical): No  Physical Activity: Insufficiently Active (01/01/2023)   Exercise Vital Sign    Days of Exercise per Week: 2 days    Minutes of Exercise per Session: 20 min  Stress: No Stress Concern Present (01/01/2023)   [employer] of Occupational Health - Occupational Stress Questionnaire    Feeling of Stress : Not at all  Social Connections: Moderately Isolated (01/01/2023)   Social Connection and Isolation Panel [NHANES]    Frequency of Communication with Friends and Family: Three times a week    Frequency of Social Gatherings with Friends and Family: More than three times a week    Attends Religious Services: More than 4 times per year    Active Member of Clubs or Organizations: No    Attends Banker Meetings: Never    Marital Status: Widowed   Past Surgical History:  Procedure Laterality Date   APPENDECTOMY  1960   AXILLARY SURGERY Left 1978   Multiple "lump" in armpit per pt   BIOPSY  06/17/2020   Gastric bx->gastritis, h pylori neg.  Procedure: BIOPSY;  Surgeon: Jeani Hawking, MD;  Location: [facility];  Service: Endoscopy;;   CARDIAC CATHETERIZATION  01/2013   nonobstructive CAD, EF 55-60%   CARDIOVASCULAR STRESS TEST  02/22/2015   Low risk myocard perf imaging; wall motion normal, normal EF   CARDIOVASCULAR STRESS TEST     09/2017 myo perf low risk   carotid duplex doppler  10/21/2017   01/2023 1-30% bilat-->rpt 1 yr   COCCYX REMOVAL  1972   CT CTA CORONARY W/CA SCORE W/CM &/OR WO/CM     04/02/23 96%'tile calcium score, nonobst CAD-->crestor increased to 40   DEXA  06/05/2017   T-score -3.1   DILATION AND CURETTAGE OF UTERUS  ? 1970's   ESOPHAGOGASTRODUODENOSCOPY N/A 07/19/2014   Gastritis found + in the setting of supratherapeutic INR,  +plavix, + meloxicam.   ESOPHAGOGASTRODUODENOSCOPY (EGD) WITH PROPOFOL N/A 06/17/2020   NO stricture or other prob to explain pt's dysphagia, dilation was done anyway.  Gastric bx's-->gastritis, h pylori neg. Procedure: ESOPHAGOGASTRODUODENOSCOPY (EGD) WITH PROPOFOL;  Surgeon: Jeani Hawking, MD;  Location: [facility];  Service: Endoscopy;  Laterality: N/A;   EYE SURGERY Left 2012-2013   "injections for ~ 1 yr; don't really know what for" (07/12/2013)   HEEL SPUR SURGERY Left 2008   kidney stone removal Right    KNEE SURGERY  2005   LEFT HEART CATHETERIZATION WITH CORONARY ANGIOGRAM N/A 01/30/2013   Procedure: LEFT HEART CATHETERIZATION WITH CORONARY ANGIOGRAM;  Surgeon: Robynn Pane, MD;  Location: [facility] CATH  LAB;  Service: Cardiovascular;  Laterality: N/A;   MALONEY DILATION  06/17/2020   Procedure: Elease Hashimoto DILATION;  Surgeon: Jeani Hawking, MD;  Location: [facility];  Service: Endoscopy;;   PLANTAR FASCIA RELEASE Left 2008   SPIROMETRY  04/25/2014   In hosp for acute asthma/COPD flare: mixed obstructive and restrictive lung disease. The FEV1 is severely reduced at 45% predicted.  FEV1 signif decreased compared to prior spirometry 07/23/13.   TENDON RELEASE  1996   Right forearm and hand   TOTAL ABDOMINAL HYSTERECTOMY  1974   TRANSTHORACIC ECHOCARDIOGRAM  01/2013; 04/2014;08/2015; 09/2017   2014--NORMAL.  2015--focal basal septal hypertrophy, EF 55-60%, grade I diast dysfxn, mild LAE.  08/2015 EF 55-60%, nl LV syst fxn, grade I DD, valves wnl. 10/21/17: EF 65-70%, grd I DD, o/w normal. 02/17/22 EF 70-75%, hyperdynamic LV fxn, grd I DD.   Past Surgical History:  Procedure Laterality Date   APPENDECTOMY  1960   AXILLARY SURGERY Left 1978   Multiple "lump" in armpit per pt   BIOPSY  06/17/2020   Gastric bx->gastritis, h pylori neg.  Procedure: BIOPSY;  Surgeon: Jeani Hawking, MD;  Location: [facility];  Service: Endoscopy;;   CARDIAC CATHETERIZATION  01/2013   nonobstructive CAD, EF 55-60%    CARDIOVASCULAR STRESS TEST  02/22/2015   Low risk myocard perf imaging; wall motion normal, normal EF   CARDIOVASCULAR STRESS TEST     09/2017 myo perf low risk   carotid duplex doppler  10/21/2017   01/2023 1-30% bilat-->rpt 1 yr   COCCYX REMOVAL  1972   CT CTA CORONARY W/CA SCORE W/CM &/OR WO/CM     04/02/23 96%'tile calcium score, nonobst CAD-->crestor increased to 40   DEXA  06/05/2017   T-score -3.1   DILATION AND CURETTAGE OF UTERUS  ? 1970's   ESOPHAGOGASTRODUODENOSCOPY N/A 07/19/2014   Gastritis found + in the setting of supratherapeutic INR, +plavix, + meloxicam.   ESOPHAGOGASTRODUODENOSCOPY (EGD) WITH PROPOFOL N/A 06/17/2020   NO stricture or other prob to explain pt's dysphagia, dilation was done anyway.  Gastric bx's-->gastritis, h pylori neg. Procedure: ESOPHAGOGASTRODUODENOSCOPY (EGD) WITH PROPOFOL;  Surgeon: Jeani Hawking, MD;  Location: [facility];  Service: Endoscopy;  Laterality: N/A;   EYE SURGERY Left 2012-2013   "injections for ~ 1 yr; don't really know what for" (07/12/2013)   HEEL SPUR SURGERY Left 2008   kidney stone removal Right    KNEE SURGERY  2005   LEFT HEART CATHETERIZATION WITH CORONARY ANGIOGRAM N/A 01/30/2013   Procedure: LEFT HEART CATHETERIZATION WITH CORONARY ANGIOGRAM;  Surgeon: Robynn Pane, MD;  Location: [facility] CATH LAB;  Service: Cardiovascular;  Laterality: N/A;   MALONEY DILATION  06/17/2020   Procedure: Elease Hashimoto DILATION;  Surgeon: Jeani Hawking, MD;  Location: [facility];  Service: Endoscopy;;   PLANTAR FASCIA RELEASE Left 2008   SPIROMETRY  04/25/2014   In hosp for acute asthma/COPD flare: mixed obstructive and restrictive lung disease. The FEV1 is severely reduced at 45% predicted.  FEV1 signif decreased compared to prior spirometry 07/23/13.   TENDON RELEASE  1996   Right forearm and hand   TOTAL ABDOMINAL HYSTERECTOMY  1974   TRANSTHORACIC ECHOCARDIOGRAM  01/2013; 04/2014;08/2015; 09/2017   2014--NORMAL.  2015--focal basal septal  hypertrophy, EF 55-60%, grade I diast dysfxn, mild LAE.  08/2015 EF 55-60%, nl LV syst fxn, grade I DD, valves wnl. 10/21/17: EF 65-70%, grd I DD, o/w normal. 02/17/22 EF 70-75%, hyperdynamic LV fxn, grd I DD.   Past Medical History:  Diagnosis Date  Acute upper GI bleed 06/2014   while pt taking coumadin, plavix, and meloxicam---despite being told not to take coumadin.   Anginal pain (HCC)    Nonobstructive CAD 2014; however, her cardiologist put her on a statin for this and NOT for hyperlipidemia per pt report.  Atyp CP 08/2017 at card f/u, plan for myoc perf imaging.   Anxiety    panic attacks   Asthma    w/ asbestososis    BPPV (benign paroxysmal positional vertigo) 12/16/2012   CAD (coronary artery disease)    Nonobstructive ([place] [facility])   Chronic diastolic CHF (congestive heart failure) (HCC)    dry wt as of 11/06/16 is 168 lbs.   Chronic lower back pain    COPD (chronic obstructive pulmonary disease) (HCC)    DDD (degenerative disc disease)    lumbar and cervical.    Diverticular disease    Fibromyalgia    Patient states dx was around her late 8s but she had sx's for years prior to this.   H/O hiatal hernia    History of pneumonia    hospitalized 12/2011, 02/2013, and 07/2013 [facility]) for this   HTN (hypertension)    Renal artery dopplers 04/2013 neg for stenosis.   Hypervitaminosis D 09/27/2019   over-supplemented.  Stopped vit D and plan recheck 2 mo.   Idiopathic angio-edema-urticaria 21308; 2021   Angioedema component was very minimal.  2021->Dr. Bobbitt (allergist) eval.   Insomnia    Iron deficiency anemia    Hematologist in [place], [place] did extensive w/u; no cause found; failed oral supplement;; gets fairly regular (q18m or so) IV iron infusions (Venofer -iron sucrose- 200mg  with procrit.  "for 14 yr I've been getting blood work q month & getting infusions prn" (07/12/2013).  Dr. Myna Hidalgo locally, iron infusions done, EPO deficiency dx'd   Kidney stone    Migraine syndrome     "not as often anymore; used to be ~ q wk" (07/12/2013)   Mixed incontinence urge and stress    Nephrolithiasis    "passed all on my own or they are still in there" (07/12/2013)   Neuroleptic induced parkinsonism (HCC) 2018   Dr. Arbutus Leas, neuro, saw her 11/24/17 and recommended d/c of abilify as first step.  D/c'd abilify and pt got complete recovery.   Oropharyngeal dysphagia    swallowing study speech path 05/2020. Gastric bx's showed gastritis, h pylori NEG   OSA on CPAP    prior to move to [place]--had another sleep study 10/2015 w/pulm Dr. Su Monks.   Osteoarthritis    "severe; progressing fast" (07/12/2013); multiple joints-not surgical candidate for TKR (03/2015).  Triamcinolon knee injections by Dr. Hermelinda Medicus 12/2017.   Pernicious anemia 08/24/2014   Pleural plaque with presence of asbestos 07/22/2013   Pulmonary embolism (HCC) 07/2013   Dx at [facility] with very small peripheral upper lobe pe 07/2013: pt took coumadin for about 8-9 mo   Pyelonephritis    "several times over the last 30 yr" (07/12/2013)   RBBB (right bundle branch block)    Recurrent major depression (HCC)    Recurrent UTI    hx of hospitalization for pyelonephritis; started abx prophylaxis 06/2015   Syncope    Hypotensive; ED visit--Dr. Sharyn Lull did Cath--nonobstructive CAD, EF 55-60%.  In retrospect, suspect pt rx med misuse/polypharmacy   Vertebral compression fracture (HCC)    Acute T12 on 02/27/22 (fall).  multiple old thoracic-->neurosurg to do MRI as of 05/2022   There were no vitals taken for this visit.  Opioid  Risk Score:   Fall Risk Score:  `1  Depression screen [facility] 2/9     05/01/2023    9:54 AM 04/23/2023   10:54 AM 01/01/2023    1:57 PM 01/01/2023    1:06 PM 10/09/2022   12:53 PM 08/07/2022    1:49 PM 06/19/2022    1:10 PM  Depression screen PHQ 2/9  Decreased Interest 3 0 0 0 0 0 3  Down, Depressed, Hopeless 2 2 2 2  0 0 2  PHQ - 2 Score 5 2 2 2  0 0 5  Altered sleeping 2 0 0 0   0  Tired, decreased energy 2 1 1 1   3    Change in appetite 2 0 1 1   2   Feeling bad or failure about yourself  2 1 1 1    0  Trouble concentrating 2 0 0 0   2  Moving slowly or fidgety/restless 1 0 0 0     Suicidal thoughts 0 0 0 0   1  PHQ-9 Score 16 4 5 5   13   Difficult doing work/chores Very difficult Somewhat difficult Somewhat difficult Somewhat difficult   Somewhat difficult    Review of Systems  Musculoskeletal:  Positive for back pain and gait problem.       Pain in both knees  All other systems reviewed and are negative.     Objective:   Physical Exam Constitutional:      Appearance: Normal appearance.  Cardiovascular:     Rate and Rhythm: Normal rate and regular rhythm.     Pulses: Normal pulses.     Heart sounds: Normal heart sounds.  Pulmonary:     Breath sounds: Wheezing present.  Musculoskeletal:     Cervical back: Normal range of motion and neck supple.     Comments: Normal Muscle Bulk and Muscle Testing Reveals:  Upper Extremities: Full ROM and Muscle Strength 5/5 Bilateral AC Joint Tenderness  Thoracic and Lumbar Hypersensitivity Bilateral Greater Trochanter Tenderness Lower Extremities: Full ROM and Muscle Strength 5/5 Arises from Table slowly using walker for support Antalgic  Gait     Skin:    General: Skin is warm and dry.  Neurological:     Mental Status: She is alert and oriented to person, place, and time.  Psychiatric:        Mood and Affect: Mood normal.        Behavior: Behavior normal.          Assessment & Plan:  Cervicalgia/ Cervical Radiculitis: Continue HEP as Tolerated.  Continue to Monitor. 06/11/2023 Left Pelvic Pain/ Left Hip Pain: No complaints today. Continue HEP as Tolerated. Continue to Monitor. 06/11/2023 3. Functional deficits secondary to Gait disorder:Continue with HEP. 06/11/2023. 4. Chronic Back pain/ Lumbar Radiculitis/fibromyalgia /R>L Knee OA Pain Management: 06/11/2023 Refilled: Oxycodone 10/325 mg one tablet every 6 hours #120.  Second script sent  for the following month.  Continue Voltaren Gel. We will continue the opioid monitoring program, this consists of regular clinic visits, examinations, urine drug screen, pill counts as well as use of [place] Controlled Substance Reporting system. A 12 month History has been reviewed on the [place] Controlled Substance Reporting System on 06/11/2023 5. Depression with anxiety/Grief reaction/Mood: Continue Xanax, Trazodone and Cymbalta . PCP Following. 06/11/2023 6. [name] with asthma:. Albuterol prn. :Pulmonology Following. 06/11/2023 7.. Bilateral Osteoarthritis of Bilateral Knees: Ms. Iglesia alst  Bilateral Zilretta Injection with Dr Sherlene Shams on 04/09/2023, with good relief noted. .Continue to Monitor.  096/19/2024 8. Fibromyalgia: Continue Home exercise regimen as tolerated. Continue to Monitor.06/11/2023 9. Bilateral  Greater Trochanteric Bursitis: Continue to  Alternate Ice and Heat Therapy. Continue to Monitor. 06/11/2023. 10. Chronic Bilateral Shoulder Pain:  No complaints  Today. Continue HEP as Tolerated. Continue current medication regimen. Continue to Monitor. 06/11/2023 11. Oxygen Desaturation and SOB: Oxygen Saturation was re-checked. Ms. Hockert admits to being SOB and refuses ED evaluation. She was encouraged to call her her Pulmonologist, she verbalizes understanding. She was encouraged multiple times to go to the Emergency room for evaluation and refuses. Dr Riley Kill also encouraged Mrs. Brandy to call her Pulmonologist, she verbalizes understanding.    F/U in 2 months

## 2023-06-11 NOTE — Patient Instructions (Addendum)
[name] you have refused to go to [facility] for evaluation.  You have arrived to our office with low Oxygen saturation, and wheezing.  You also state you are Short of Breath .  Please call your Pulmonologist today, you seen them on 06/03/2023.   PLEASE  Go to the Emergency Room to be Evaluated , today

## 2023-06-12 ENCOUNTER — Telehealth: Payer: Self-pay | Admitting: Cardiology

## 2023-06-12 NOTE — Telephone Encounter (Signed)
Left voicemail to return call back to office

## 2023-06-12 NOTE — Telephone Encounter (Signed)
   Pt is adamantly saying she was told by Dr. Jens Som to schedule an appt after her CT. Pt refused to go to [place] and declined to see APP. She wants to speak with Dr. Ludwig Clarks nurse

## 2023-06-16 ENCOUNTER — Telehealth: Payer: Self-pay | Admitting: Family Medicine

## 2023-06-16 DIAGNOSIS — M6281 Muscle weakness (generalized): Secondary | ICD-10-CM | POA: Diagnosis not present

## 2023-06-16 NOTE — Telephone Encounter (Signed)
Patient called and reports that she was told she needed to contact her primary care physician to get the ball rolling on oxygen equipment.  She says that she was diagnosed with respiratory failure a few months ago by her pulmonologist who is managing the respiratory failure. She reports that she was told by Medicare to contact Dr. Milinda Cave to get oxygen equipment. Please give the patient a call to discuss she declined appointment at time of call.

## 2023-06-17 ENCOUNTER — Encounter: Payer: Self-pay | Admitting: Hematology & Oncology

## 2023-06-17 NOTE — Telephone Encounter (Signed)
Call straight to VM.  Lvm to call office

## 2023-06-17 NOTE — Telephone Encounter (Signed)
Sorry but I must defer to pulmonology for this.

## 2023-06-17 NOTE — Telephone Encounter (Signed)
Follow Up:     Patient is retuning a call from today. 

## 2023-06-17 NOTE — Telephone Encounter (Signed)
Spoke with pt, Follow up scheduled  

## 2023-06-18 DIAGNOSIS — M6281 Muscle weakness (generalized): Secondary | ICD-10-CM | POA: Diagnosis not present

## 2023-06-23 DIAGNOSIS — M6281 Muscle weakness (generalized): Secondary | ICD-10-CM | POA: Diagnosis not present

## 2023-06-24 ENCOUNTER — Telehealth: Payer: Self-pay | Admitting: Family Medicine

## 2023-06-24 ENCOUNTER — Ambulatory Visit: Payer: Self-pay

## 2023-06-24 DIAGNOSIS — J920 Pleural plaque with presence of asbestos: Secondary | ICD-10-CM

## 2023-06-24 DIAGNOSIS — J9611 Chronic respiratory failure with hypoxia: Secondary | ICD-10-CM

## 2023-06-24 DIAGNOSIS — J449 Chronic obstructive pulmonary disease, unspecified: Secondary | ICD-10-CM

## 2023-06-24 NOTE — Patient Instructions (Signed)
Visit Information  Thank you for taking time to visit with me today. Please don't hesitate to contact me if I can be of assistance to you.   Following are the goals we discussed today:   Goals Addressed             This Visit's Progress    Managing Chronic Health Issues       Care Coordination Interventions: Discussed plans with patient for ongoing care management follow up and provided patient with direct contact information for care management team Provided education on importance of blood pressure control in management of CAD Provided education on Importance of limiting foods high in cholesterol Reviewed Importance of attending all scheduled provider appointments Provided patient with basic written and verbal COPD education on self care/management/and exacerbation prevention Advised patient to track and manage COPD triggers Advised patient to self assesses COPD action plan zone and make appointment with provider if in the yellow zone for 48 hours without improvement Evaluation of current treatment plan related to hypertension self management and patient's adherence to plan as established by provider Emotional Support Provided  Patient continues with respiratory failure. She declines hospitalization despite recommendation from multiple providers.  Patient  coughing on the phone.  Patient reports oxygen sat's upper 80's to low 90's with oxygen.  Reports continuing to use nebulizer treatments and inhalers to help. She is requesting referral to a new pulmonologist.  Request sent to Dr. Milinda Cave and care team.  RN CM reiterated COPD and management.  Patient really not wanting return to the hospital.         Our next appointment is by telephone on 07/17/23 at 1:00 pm  Please call the care guide team at [phone] if you need to cancel or reschedule your appointment.   If you are experiencing a Mental Health or Behavioral Health Crisis or need someone to talk to, please call the Suicide  and Crisis Lifeline: 988   Patient verbalizes understanding of instructions and care plan provided today and agrees to view in MyChart. Active MyChart status and patient understanding of how to access instructions and care plan via MyChart confirmed with patient.     The patient has been provided with contact information for the care management team and has been advised to call with any health related questions or concerns.   Bary Leriche, RN, MSN [place] Care Management Care Management Coordinator Direct Line [phone]

## 2023-06-24 NOTE — Patient Outreach (Signed)
  Care Coordination   Follow Up Visit Note   06/24/2023 Name: Linda [name] MRN: [id number] DOB: [birth year]  Linda [name] is a 77 y.o. year old female who sees McGowen, Maryjean Morn, MD for primary care. I spoke with  Linda [name] by phone today.  What matters to the patients health and wellness today?  Control her breathing    Goals Addressed             This Visit's Progress    Managing Chronic Health Issues       Care Coordination Interventions: Discussed plans with patient for ongoing care management follow up and provided patient with direct contact information for care management team Provided education on importance of blood pressure control in management of CAD Provided education on Importance of limiting foods high in cholesterol Reviewed Importance of attending all scheduled provider appointments Provided patient with basic written and verbal COPD education on self care/management/and exacerbation prevention Advised patient to track and manage COPD triggers Advised patient to self assesses COPD action plan zone and make appointment with provider if in the yellow zone for 48 hours without improvement Evaluation of current treatment plan related to hypertension self management and patient's adherence to plan as established by provider Emotional Support Provided  Patient continues with respiratory failure. She declines hospitalization despite recommendation from multiple providers.  Patient  coughing on the phone.  Patient reports oxygen sat's upper 80's to low 90's with oxygen.  Reports continuing to use nebulizer treatments and inhalers to help. She is requesting referral to a new pulmonologist.  Request sent to Dr. Milinda Cave and care team.  RN CM reiterated COPD and management.  Patient really not wanting return to the hospital.         SDOH assessments and interventions completed:  Yes     Care Coordination Interventions:  Yes, provided    Follow up plan:  Follow up call scheduled for July    Encounter Outcome:  Pt. Visit Completed   Bary Leriche, RN, MSN [facility] Care Management Care Management Coordinator Direct Line [phone]

## 2023-06-24 NOTE — Telephone Encounter (Signed)
Pulm referral ordered to [facility] per pt request (currently a pt of Dr. Su Monks with [facility]).

## 2023-06-25 DIAGNOSIS — M6281 Muscle weakness (generalized): Secondary | ICD-10-CM | POA: Diagnosis not present

## 2023-06-25 NOTE — Telephone Encounter (Signed)
Spoke with pt

## 2023-06-25 NOTE — Telephone Encounter (Signed)
Left pt VM to discuss

## 2023-07-02 ENCOUNTER — Telehealth: Payer: Self-pay | Admitting: Family Medicine

## 2023-07-02 ENCOUNTER — Encounter: Payer: Medicare Other | Attending: Physical Medicine & Rehabilitation | Admitting: Physical Medicine & Rehabilitation

## 2023-07-02 ENCOUNTER — Encounter: Payer: Self-pay | Admitting: Physical Medicine & Rehabilitation

## 2023-07-02 VITALS — BP 110/61 | HR 70 | Ht 64.0 in | Wt 149.0 lb

## 2023-07-02 DIAGNOSIS — M5416 Radiculopathy, lumbar region: Secondary | ICD-10-CM | POA: Diagnosis not present

## 2023-07-02 DIAGNOSIS — M1712 Unilateral primary osteoarthritis, left knee: Secondary | ICD-10-CM | POA: Insufficient documentation

## 2023-07-02 DIAGNOSIS — M1711 Unilateral primary osteoarthritis, right knee: Secondary | ICD-10-CM | POA: Diagnosis not present

## 2023-07-02 MED ORDER — BUPRENORPHINE 5 MCG/HR TD PTWK
1.0000 | MEDICATED_PATCH | TRANSDERMAL | 1 refills | Status: DC
Start: 2023-07-02 — End: 2023-07-21

## 2023-07-02 MED ORDER — METHOCARBAMOL 500 MG PO TABS
500.0000 mg | ORAL_TABLET | Freq: Three times a day (TID) | ORAL | 2 refills | Status: DC | PRN
Start: 2023-07-02 — End: 2023-12-25

## 2023-07-02 MED ORDER — OXYCODONE-ACETAMINOPHEN 10-325 MG PO TABS
1.0000 | ORAL_TABLET | Freq: Four times a day (QID) | ORAL | 0 refills | Status: DC | PRN
Start: 2023-07-02 — End: 2023-08-06

## 2023-07-02 NOTE — Telephone Encounter (Signed)
Please advise 

## 2023-07-02 NOTE — Progress Notes (Signed)
Subjective:    Patient ID: Linda [name], female    DOB: December 27, 1945, 77 y.o.   MRN: [id number]  HPI  [name] is here in follow up of her chronic pain. She still is dealing with constant knee pain.  We last did Zilretta injections in April.  I asked her if she felt that if these were helping and she says they are giving her 3 months of relief.  It is not all inclusive relief but helping nonetheless.  She also takes oxycodone 10 mg/325 4 times daily.  She is on Robaxin for muscle spasms which seems to work better for her than Flexeril.  She received the Robaxin from the hospitalization.  PT is working with her on some exercises for her knees to help "straighten them out".  She is doing exercises daily.  She remains on 2-3L of oxygen per pulmonary for her asbestos related lung disease..  She becomes short of breath easily, even with conversation.  It is a chore for her to do much from a physical standpoint given her lung disease.   Pain Inventory Average Pain 7 Pain Right Now 8 My pain is constant, sharp, stabbing, and aching  In the last 24 hours, has pain interfered with the following? General activity 9 Relation with others 8 Enjoyment of life 7 What TIME of day is your pain at its worst? morning , daytime, evening, and night Sleep (in general) Fair  Pain is worse with: walking, bending, standing, and some activites Pain improves with: rest and medication Relief from Meds: 5  Family History  Problem Relation Age of Onset   Arthritis Mother    Kidney disease Mother    Heart disease Father    Stroke Father    Hypertension Father    Diabetes Father    Heart attack Father    Heart attack Paternal Grandmother    Diabetes Sister        one sister   Hypertension Sister    Asthma Sister    Hypertension Brother    Asthma Brother    Asthma Daughter    Multiple sclerosis Son    Social History   Socioeconomic History   Marital status: Widowed    Spouse name: Not on  file   Number of children: 2   Years of education: Not on file   Highest education level: Not on file  Occupational History   Occupation: Retired    Comment: Runner, 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  Relocated to [place] 09/2012 to be closer to her son who has MS.   Husband d 2015--mesothelioma.   Occupation: former Runner, broadcasting/film/video.   Education: masters degree level.   No T/A/Ds.   Lives in [place], independent living.   Social Determinants of Health   Financial Resource Strain: Low Risk  (01/01/2023)   Overall Financial Resource Strain (CARDIA)    Difficulty of Paying Living Expenses: Not hard at all  Food Insecurity: No Food Insecurity (04/08/2023)   Hunger Vital Sign    Worried About Running  Out of Food in the Last Year: Never true    Ran Out of Food in the Last Year: Never true  Transportation Needs: No Transportation Needs (04/23/2023)   PRAPARE - Administrator, Civil Service (Medical): No    Lack of Transportation (Non-Medical): No  Physical Activity: Insufficiently Active (01/01/2023)   Exercise Vital Sign    Days of Exercise per Week: 2 days    Minutes of Exercise per Session: 20 min  Stress: No Stress Concern Present (01/01/2023)   [employer] of Occupational Health - Occupational Stress Questionnaire    Feeling of Stress : Not at all  Social Connections: Moderately Isolated (01/01/2023)   Social Connection and Isolation Panel [NHANES]    Frequency of Communication with Friends and Family: Three times a week    Frequency of Social Gatherings with Friends and Family: More than three times a week    Attends  Religious Services: More than 4 times per year    Active Member of Clubs or Organizations: No    Attends Banker Meetings: Never    Marital Status: Widowed   Past Surgical History:  Procedure Laterality Date   APPENDECTOMY  1960   AXILLARY SURGERY Left 1978   Multiple "lump" in armpit per pt   BIOPSY  06/17/2020   Gastric bx->gastritis, h pylori neg.  Procedure: BIOPSY;  Surgeon: Jeani Hawking, MD;  Location: [facility];  Service: Endoscopy;;   CARDIAC CATHETERIZATION  01/2013   nonobstructive CAD, EF 55-60%   CARDIOVASCULAR STRESS TEST  02/22/2015   Low risk myocard perf imaging; wall motion normal, normal EF   CARDIOVASCULAR STRESS TEST     09/2017 myo perf low risk   carotid duplex doppler  10/21/2017   01/2023 1-30% bilat-->rpt 1 yr   COCCYX REMOVAL  1972   CT CTA CORONARY W/CA SCORE W/CM &/OR WO/CM     04/02/23 96%'tile calcium score, nonobst CAD-->crestor increased to 40   DEXA  06/05/2017   T-score -3.1   DILATION AND CURETTAGE OF UTERUS  ? 1970's   ESOPHAGOGASTRODUODENOSCOPY N/A 07/19/2014   Gastritis found + in the setting of supratherapeutic INR, +plavix, + meloxicam.   ESOPHAGOGASTRODUODENOSCOPY (EGD) WITH PROPOFOL N/A 06/17/2020   NO stricture or other prob to explain pt's dysphagia, dilation was done anyway.  Gastric bx's-->gastritis, h pylori neg. Procedure: ESOPHAGOGASTRODUODENOSCOPY (EGD) WITH PROPOFOL;  Surgeon: Jeani Hawking, MD;  Location: [facility];  Service: Endoscopy;  Laterality: N/A;   EYE SURGERY Left 2012-2013   "injections for ~ 1 yr; don't really know what for" (07/12/2013)   HEEL SPUR SURGERY Left 2008   kidney stone removal Right    KNEE SURGERY  2005   LEFT HEART CATHETERIZATION WITH CORONARY ANGIOGRAM N/A 01/30/2013   Procedure: LEFT HEART CATHETERIZATION WITH CORONARY ANGIOGRAM;  Surgeon: Robynn Pane, MD;  Location: [facility];  Service: Cardiovascular;  Laterality: N/A;   MALONEY DILATION  06/17/2020   Procedure: Elease Hashimoto  DILATION;  Surgeon: Jeani Hawking, MD;  Location: [facility];  Service: Endoscopy;;   PLANTAR FASCIA RELEASE Left 2008   SPIROMETRY  04/25/2014   In hosp for acute asthma/COPD flare: mixed obstructive and restrictive lung disease. The FEV1 is severely reduced at 45% predicted.  FEV1 signif decreased compared to prior spirometry 07/23/13.   TENDON RELEASE  1996   Right forearm and hand   TOTAL ABDOMINAL HYSTERECTOMY  1974   TRANSTHORACIC ECHOCARDIOGRAM  01/2013; 04/2014;08/2015; 09/2017   2014--NORMAL.  2015--focal basal septal hypertrophy, EF  55-60%, grade I diast dysfxn, mild LAE.  08/2015 EF 55-60%, nl LV syst fxn, grade I DD, valves wnl. 10/21/17: EF 65-70%, grd I DD, o/w normal. 02/17/22 EF 70-75%, hyperdynamic LV fxn, grd I DD.   Past Surgical History:  Procedure Laterality Date   APPENDECTOMY  1960   AXILLARY SURGERY Left 1978   Multiple "lump" in armpit per pt   BIOPSY  06/17/2020   Gastric bx->gastritis, h pylori neg.  Procedure: BIOPSY;  Surgeon: Jeani Hawking, MD;  [facility]: [facility];  Service: Endoscopy;;   CARDIAC CATHETERIZATION  01/2013   nonobstructive CAD, EF 55-60%   CARDIOVASCULAR STRESS TEST  02/22/2015   Low risk myocard perf imaging; wall motion normal, normal EF   CARDIOVASCULAR STRESS TEST     09/2017 myo perf low risk   carotid duplex doppler  10/21/2017   01/2023 1-30% bilat-->rpt 1 yr   COCCYX REMOVAL  1972   CT CTA CORONARY W/CA SCORE W/CM &/OR WO/CM     04/02/23 96%'tile calcium score, nonobst CAD-->crestor increased to 40   DEXA  06/05/2017   T-score -3.1   DILATION AND CURETTAGE OF UTERUS  ? 1970's   ESOPHAGOGASTRODUODENOSCOPY N/A 07/19/2014   Gastritis found + in the setting of supratherapeutic INR, +plavix, + meloxicam.   ESOPHAGOGASTRODUODENOSCOPY (EGD) WITH PROPOFOL N/A 06/17/2020   NO stricture or other prob to explain pt's dysphagia, dilation was done anyway.  Gastric bx's-->gastritis, h pylori neg. Procedure: ESOPHAGOGASTRODUODENOSCOPY (EGD) WITH  PROPOFOL;  Surgeon: Jeani Hawking, MD;  [facility];  Service: Endoscopy;  Laterality: N/A;   EYE SURGERY Left 2012-2013   "injections for ~ 1 yr; don't really know what for" (07/12/2013)   HEEL SPUR SURGERY Left 2008   kidney stone removal Right    KNEE SURGERY  2005   LEFT HEART CATHETERIZATION WITH CORONARY ANGIOGRAM N/A 01/30/2013   Procedure: LEFT HEART CATHETERIZATION WITH CORONARY ANGIOGRAM;  Surgeon: Robynn Pane, MD;  [facility]: [facility];  Service: Cardiovascular;  Laterality: N/A;   MALONEY DILATION  06/17/2020   Procedure: Elease Hashimoto DILATION;  Surgeon: Jeani Hawking, MD;  [facility]: [facility];  Service: Endoscopy;;   PLANTAR FASCIA RELEASE Left 2008   SPIROMETRY  04/25/2014   In hosp for acute asthma/COPD flare: mixed obstructive and restrictive lung disease. The FEV1 is severely reduced at 45% predicted.  FEV1 signif decreased compared to prior spirometry 07/23/13.   TENDON RELEASE  1996   Right forearm and hand   TOTAL ABDOMINAL HYSTERECTOMY  1974   TRANSTHORACIC ECHOCARDIOGRAM  01/2013; 04/2014;08/2015; 09/2017   2014--NORMAL.  2015--focal basal septal hypertrophy, EF 55-60%, grade I diast dysfxn, mild LAE.  08/2015 EF 55-60%, nl LV syst fxn, grade I DD, valves wnl. 10/21/17: EF 65-70%, grd I DD, o/w normal. 02/17/22 EF 70-75%, hyperdynamic LV fxn, grd I DD.   Past Medical History:  Diagnosis Date   Acute upper GI bleed 06/2014   while pt taking coumadin, plavix, and meloxicam---despite being told not to take coumadin.   Anginal pain (HCC)    Nonobstructive CAD 2014; however, her cardiologist put her on a statin for this and NOT for hyperlipidemia per pt report.  Atyp CP 08/2017 at card f/u, plan for myoc perf imaging.   Anxiety    panic attacks   Asthma    w/ asbestososis    BPPV (benign paroxysmal positional vertigo) 12/16/2012   CAD (coronary artery disease)    Nonobstructive ([facility])   Chronic diastolic CHF (congestive heart failure) (HCC)  dry wt as of  11/06/16 is 168 lbs.   Chronic lower back pain    COPD (chronic obstructive pulmonary disease) (HCC)    DDD (degenerative disc disease)    lumbar and cervical.    Diverticular disease    Fibromyalgia    Patient states dx was around her late 53s but she had sx's for years prior to this.   H/O hiatal hernia    History of pneumonia    hospitalized 12/2011, 02/2013, and 07/2013 [facility]) for this   HTN (hypertension)    Renal artery dopplers 04/2013 neg for stenosis.   Hypervitaminosis D 09/27/2019   over-supplemented.  Stopped vit D and plan recheck 2 mo.   Idiopathic angio-edema-urticaria 16109; 2021   Angioedema component was very minimal.  2021->Dr. Bobbitt (allergist) eval.   Insomnia    Iron deficiency anemia    Hematologist in [place], [place] did extensive w/u; no cause found; failed oral supplement;; gets fairly regular (q34m or so) IV iron infusions (Venofer -iron sucrose- 200mg  with procrit.  "for 14 yr I've been getting blood work q month & getting infusions prn" (07/12/2013).  Dr. Myna Hidalgo locally, iron infusions done, EPO deficiency dx'd   Kidney stone    Migraine syndrome    "not as often anymore; used to be ~ q wk" (07/12/2013)   Mixed incontinence urge and stress    Nephrolithiasis    "passed all on my own or they are still in there" (07/12/2013)   Neuroleptic induced parkinsonism (HCC) 2018   Dr. Arbutus Leas, neuro, saw her 11/24/17 and recommended d/c of abilify as first step.  D/c'd abilify and pt got complete recovery.   Oropharyngeal dysphagia    swallowing study speech path 05/2020. Gastric bx's showed gastritis, h pylori NEG   OSA on CPAP    prior to move to [place]--had another sleep study 10/2015 w/pulm Dr. Su Monks.   Osteoarthritis    "severe; progressing fast" (07/12/2013); multiple joints-not surgical candidate for TKR (03/2015).  Triamcinolon knee injections by Dr. Hermelinda Medicus 12/2017.   Pernicious anemia 08/24/2014   Pleural plaque with presence of asbestos 07/22/2013   Pulmonary embolism  (HCC) 07/2013   Dx at [facility] with very small peripheral upper lobe pe 07/2013: pt took coumadin for about 8-9 mo   Pyelonephritis    "several times over the last 30 yr" (07/12/2013)   RBBB (right bundle branch block)    Recurrent major depression (HCC)    Recurrent UTI    hx of hospitalization for pyelonephritis; started abx prophylaxis 06/2015   Syncope    Hypotensive; ED visit--Dr. Sharyn Lull did Cath--nonobstructive CAD, EF 55-60%.  In retrospect, suspect pt rx med misuse/polypharmacy   Vertebral compression fracture (HCC)    Acute T12 on 02/27/22 (fall).  multiple old thoracic-->neurosurg to do MRI as of 05/2022   BP 110/61   Pulse 70   Ht 5\' 4"  (1.626 m)   Wt 149 lb (67.6 kg)   SpO2 95% Comment: 3 liters of O2  BMI 25.58 kg/m   Opioid Risk Score:   Fall Risk Score:  `1  Depression screen [facility] 2/9     07/02/2023    9:23 AM 06/11/2023   11:03 AM 05/01/2023    9:54 AM 04/23/2023   10:54 AM 01/01/2023    1:57 PM 01/01/2023    1:06 PM 10/09/2022   12:53 PM  Depression screen PHQ 2/9  Decreased Interest 0 0 3 0 0 0 0  Down, Depressed, Hopeless  0 2 2 2  2  0  PHQ - 2 Score 0 0 5 2 2 2  0  Altered sleeping   2 0 0 0   Tired, decreased energy   2 1 1 1    Change in appetite   2 0 1 1   Feeling bad or failure about yourself    2 1 1 1    Trouble concentrating   2 0 0 0   Moving slowly or fidgety/restless   1 0 0 0   Suicidal thoughts   0 0 0 0   PHQ-9 Score   16 4 5 5    Difficult doing work/chores   Very difficult Somewhat difficult Somewhat difficult Somewhat difficult     Review of Systems  Musculoskeletal:  Positive for back pain and gait problem.       PAIN IN BOTH KNEES , RIGHT BUTTOCK PAIN, RIGHT HIP PAIN  All other systems reviewed and are negative.     Objective:   Physical Exam  General: Alert and oriented x 3, No apparent distress HEENT: Head is normocephalic, atraumatic, PERRLA, EOMI, sclera anicteric, oral mucosa pink and moist, dentition intact, ext ear canals clear,   Neck: Supple without JVD or lymphadenopathy Heart: Reg rate and rhythm. No murmurs rubs or gallops Chest: wheezing, coughing, sob, O2 2L Abdomen: Soft, non-tender, non-distended, bowel sounds positive. Extremities: No clubbing, cyanosis, or edema. Pulses are 2+ Psych: Pt's affect is appropriate. Pt is cooperative Skin: Clean and intact without signs of breakdown Neuro:  Alert and oriented x 3. Normal insight and awareness. Intact Memory. Normal language and speech. Cranial nerve exam unremarkable. MMT: UE 5/5.  LE 3-4/5 prox to distal.   Musculoskeletal: bilateral knees with valgus deformities and crepitus. Antalgic bilaterally. Pain with resisted ROM. + jt line pain medially more than laterally.         Assessment & Plan:  Cervicalgia/ Cervical Radiculitis: Continue HEP as Tolerated.  Left Pelvic Pain/ Left Hip Pain: No complaints today. Continue HEP  .   3. Functional deficits secondary to Gait disorder:Continue with HEP. 06/11/2023. 4. Chronic Back pain/ Lumbar Radiculitis/fibromyalgia /R>L Knee OA Pain Management:    We will continue the controlled substance monitoring program, this consists of regular clinic visits, examinations, routine drug screening, pill counts as well as use of [place] Controlled Substance Reporting System. [place] today.    -Continue Voltaren Gel. -begin trial of butrans patch weekly. Need to be careful with opioid load/tolerance. She has been on oxycodone for some time, but we still need to tread carefully. I reiterated that to her today. butrans is a very conservative dose. If she has any issues with medication, she was advised to remove it immediately. I discussed application, do/don'ts with her today 5. Depression with anxiety/Grief reaction/Mood: Continue Xanax, Trazodone and Cymbalta . PCP   6. [name] with asthma/hypoxia:. Albuterol prn. :Pulmonology Following. ON 2-3l O2  -maintain nebs and close f/u with pulmonology! 7..  Bilateral Osteoarthritis of Bilateral Knees -she feels that zilretta is still giving her 3+ mos of relief -will repeat injections in a month 8. Fibromyalgia: Continue Home exercise regimen as tolerated.   9. Bilateral  Greater Trochanteric Bursitis: Continue to  Alternate Ice and Heat Therapy.   10. Chronic Bilateral Shoulder Pain:       Twenty minutes of face to face patient care time were spent during this visit. All questions were encouraged and answered.  Follow up with me in one month for zilretta .

## 2023-07-02 NOTE — Telephone Encounter (Signed)
Patient called and reports that her oxygen equipment is not working correctly and is now leaking. I advised her that she would need to contact her pulmonologist provider. She states that she is currently in between pulmonologist due to Dr. Su Monks being out of network. She is asking that Dr. Milinda Cave assist in getting her a new machine. I advised Linda [name] that I would have someone give her a call to discuss options.

## 2023-07-02 NOTE — Patient Instructions (Addendum)
ALWAYS FEEL FREE TO CALL OUR OFFICE WITH ANY PROBLEMS OR QUESTIONS [phone])  **PLEASE NOTE** ALL MEDICATION REFILL REQUESTS (INCLUDING CONTROLLED SUBSTANCES) NEED TO BE MADE AT LEAST 7 DAYS PRIOR TO REFILL BEING DUE. ANY REFILL REQUESTS INSIDE THAT TIME FRAME MAY RESULT IN DELAYS IN RECEIVING YOUR PRESCRIPTION.    BUTRANS PATCH:  TAKE WEEKLY. APPLY TO SOFT CLEAN TISSUE AREA ON CHEST OR UPPER ARM. MAKE SURE ITS FIRMLY ATTACHED . CHANGE WEEKLY.

## 2023-07-03 NOTE — Telephone Encounter (Signed)
Spoke to patient about getting oxygen supplies to patient. Patient is requesting portable tank also.  [facility] can be reached at [phone]

## 2023-07-03 NOTE — Telephone Encounter (Signed)
Find out what home health/respiratory supply agency supplies her oxygen and find out how we go about helping.

## 2023-07-03 NOTE — Telephone Encounter (Signed)
Spoke with [employer] [facility] and since she has had the equipment since 2015. They will need a new prescription along with office notes faxed to their office. I will try to get all the documents I will just need a new prescription from you.

## 2023-07-03 NOTE — Telephone Encounter (Signed)
noted 

## 2023-07-04 ENCOUNTER — Telehealth: Payer: Self-pay | Admitting: *Deleted

## 2023-07-04 ENCOUNTER — Other Ambulatory Visit: Payer: Medicare Other

## 2023-07-04 NOTE — Telephone Encounter (Signed)
Kurtis Bushman (Key: [id number]) - [id number] Buprenorphine 5MCG/HR weekly patches Status: PA Response - ApprovedCreated: July 10th, 2024 [id number]

## 2023-07-06 ENCOUNTER — Encounter (HOSPITAL_COMMUNITY): Payer: Self-pay

## 2023-07-06 ENCOUNTER — Emergency Department (HOSPITAL_COMMUNITY): Payer: Medicare Other

## 2023-07-06 ENCOUNTER — Inpatient Hospital Stay (HOSPITAL_COMMUNITY)
Admission: EM | Admit: 2023-07-06 | Discharge: 2023-07-21 | DRG: 152 | Disposition: A | Discharge status: Home Health | Payer: Medicare Other | Attending: Internal Medicine | Admitting: Internal Medicine

## 2023-07-06 ENCOUNTER — Other Ambulatory Visit: Payer: Self-pay

## 2023-07-06 DIAGNOSIS — Z789 Other specified health status: Secondary | ICD-10-CM | POA: Diagnosis not present

## 2023-07-06 DIAGNOSIS — G8929 Other chronic pain: Secondary | ICD-10-CM | POA: Diagnosis not present

## 2023-07-06 DIAGNOSIS — Z1152 Encounter for screening for COVID-19: Secondary | ICD-10-CM | POA: Diagnosis not present

## 2023-07-06 DIAGNOSIS — M4854XA Collapsed vertebra, not elsewhere classified, thoracic region, initial encounter for fracture: Secondary | ICD-10-CM | POA: Diagnosis present

## 2023-07-06 DIAGNOSIS — N179 Acute kidney failure, unspecified: Secondary | ICD-10-CM | POA: Diagnosis not present

## 2023-07-06 DIAGNOSIS — M1990 Unspecified osteoarthritis, unspecified site: Secondary | ICD-10-CM | POA: Diagnosis present

## 2023-07-06 DIAGNOSIS — G4733 Obstructive sleep apnea (adult) (pediatric): Secondary | ICD-10-CM | POA: Diagnosis present

## 2023-07-06 DIAGNOSIS — E876 Hypokalemia: Secondary | ICD-10-CM | POA: Diagnosis present

## 2023-07-06 DIAGNOSIS — J9621 Acute and chronic respiratory failure with hypoxia: Secondary | ICD-10-CM | POA: Diagnosis not present

## 2023-07-06 DIAGNOSIS — R0689 Other abnormalities of breathing: Secondary | ICD-10-CM | POA: Diagnosis not present

## 2023-07-06 DIAGNOSIS — Z66 Do not resuscitate: Secondary | ICD-10-CM | POA: Diagnosis present

## 2023-07-06 DIAGNOSIS — Z880 Allergy status to penicillin: Secondary | ICD-10-CM

## 2023-07-06 DIAGNOSIS — J019 Acute sinusitis, unspecified: Secondary | ICD-10-CM

## 2023-07-06 DIAGNOSIS — R058 Other specified cough: Secondary | ICD-10-CM | POA: Diagnosis not present

## 2023-07-06 DIAGNOSIS — I10 Essential (primary) hypertension: Secondary | ICD-10-CM | POA: Diagnosis not present

## 2023-07-06 DIAGNOSIS — F1120 Opioid dependence, uncomplicated: Secondary | ICD-10-CM | POA: Diagnosis present

## 2023-07-06 DIAGNOSIS — R451 Restlessness and agitation: Secondary | ICD-10-CM | POA: Diagnosis not present

## 2023-07-06 DIAGNOSIS — E785 Hyperlipidemia, unspecified: Secondary | ICD-10-CM | POA: Diagnosis present

## 2023-07-06 DIAGNOSIS — J4541 Moderate persistent asthma with (acute) exacerbation: Secondary | ICD-10-CM | POA: Diagnosis not present

## 2023-07-06 DIAGNOSIS — J9690 Respiratory failure, unspecified, unspecified whether with hypoxia or hypercapnia: Secondary | ICD-10-CM | POA: Diagnosis not present

## 2023-07-06 DIAGNOSIS — J0130 Acute sphenoidal sinusitis, unspecified: Principal | ICD-10-CM

## 2023-07-06 DIAGNOSIS — J441 Chronic obstructive pulmonary disease with (acute) exacerbation: Secondary | ICD-10-CM | POA: Diagnosis present

## 2023-07-06 DIAGNOSIS — Z8249 Family history of ischemic heart disease and other diseases of the circulatory system: Secondary | ICD-10-CM

## 2023-07-06 DIAGNOSIS — R059 Cough, unspecified: Secondary | ICD-10-CM | POA: Diagnosis not present

## 2023-07-06 DIAGNOSIS — Z90710 Acquired absence of both cervix and uterus: Secondary | ICD-10-CM

## 2023-07-06 DIAGNOSIS — Z9981 Dependence on supplemental oxygen: Secondary | ICD-10-CM

## 2023-07-06 DIAGNOSIS — J383 Other diseases of vocal cords: Secondary | ICD-10-CM

## 2023-07-06 DIAGNOSIS — I4510 Unspecified right bundle-branch block: Secondary | ICD-10-CM | POA: Diagnosis present

## 2023-07-06 DIAGNOSIS — R0603 Acute respiratory distress: Secondary | ICD-10-CM | POA: Diagnosis not present

## 2023-07-06 DIAGNOSIS — Z91148 Patient's other noncompliance with medication regimen for other reason: Secondary | ICD-10-CM

## 2023-07-06 DIAGNOSIS — J392 Other diseases of pharynx: Secondary | ICD-10-CM | POA: Diagnosis not present

## 2023-07-06 DIAGNOSIS — Z7401 Bed confinement status: Secondary | ICD-10-CM | POA: Diagnosis not present

## 2023-07-06 DIAGNOSIS — B9689 Other specified bacterial agents as the cause of diseases classified elsewhere: Secondary | ICD-10-CM | POA: Diagnosis not present

## 2023-07-06 DIAGNOSIS — K219 Gastro-esophageal reflux disease without esophagitis: Secondary | ICD-10-CM | POA: Diagnosis not present

## 2023-07-06 DIAGNOSIS — J9601 Acute respiratory failure with hypoxia: Secondary | ICD-10-CM

## 2023-07-06 DIAGNOSIS — F339 Major depressive disorder, recurrent, unspecified: Secondary | ICD-10-CM | POA: Diagnosis present

## 2023-07-06 DIAGNOSIS — R0902 Hypoxemia: Principal | ICD-10-CM

## 2023-07-06 DIAGNOSIS — J929 Pleural plaque without asbestos: Secondary | ICD-10-CM | POA: Diagnosis not present

## 2023-07-06 DIAGNOSIS — J329 Chronic sinusitis, unspecified: Secondary | ICD-10-CM | POA: Diagnosis not present

## 2023-07-06 DIAGNOSIS — J61 Pneumoconiosis due to asbestos and other mineral fibers: Secondary | ICD-10-CM | POA: Diagnosis present

## 2023-07-06 DIAGNOSIS — I110 Hypertensive heart disease with heart failure: Secondary | ICD-10-CM | POA: Diagnosis present

## 2023-07-06 DIAGNOSIS — F419 Anxiety disorder, unspecified: Secondary | ICD-10-CM | POA: Diagnosis not present

## 2023-07-06 DIAGNOSIS — I5032 Chronic diastolic (congestive) heart failure: Secondary | ICD-10-CM | POA: Diagnosis present

## 2023-07-06 DIAGNOSIS — J9620 Acute and chronic respiratory failure, unspecified whether with hypoxia or hypercapnia: Secondary | ICD-10-CM | POA: Diagnosis present

## 2023-07-06 DIAGNOSIS — I700 Atherosclerosis of aorta: Secondary | ICD-10-CM | POA: Diagnosis present

## 2023-07-06 DIAGNOSIS — R0602 Shortness of breath: Secondary | ICD-10-CM | POA: Diagnosis not present

## 2023-07-06 DIAGNOSIS — R052 Subacute cough: Secondary | ICD-10-CM | POA: Diagnosis not present

## 2023-07-06 DIAGNOSIS — Z8261 Family history of arthritis: Secondary | ICD-10-CM

## 2023-07-06 DIAGNOSIS — M5136 Other intervertebral disc degeneration, lumbar region: Secondary | ICD-10-CM | POA: Diagnosis present

## 2023-07-06 DIAGNOSIS — J9600 Acute respiratory failure, unspecified whether with hypoxia or hypercapnia: Secondary | ICD-10-CM | POA: Diagnosis not present

## 2023-07-06 DIAGNOSIS — Z86711 Personal history of pulmonary embolism: Secondary | ICD-10-CM

## 2023-07-06 DIAGNOSIS — M797 Fibromyalgia: Secondary | ICD-10-CM | POA: Diagnosis present

## 2023-07-06 DIAGNOSIS — Z79899 Other long term (current) drug therapy: Secondary | ICD-10-CM

## 2023-07-06 DIAGNOSIS — Z825 Family history of asthma and other chronic lower respiratory diseases: Secondary | ICD-10-CM

## 2023-07-06 DIAGNOSIS — E041 Nontoxic single thyroid nodule: Secondary | ICD-10-CM | POA: Diagnosis present

## 2023-07-06 DIAGNOSIS — Z7901 Long term (current) use of anticoagulants: Secondary | ICD-10-CM

## 2023-07-06 DIAGNOSIS — D649 Anemia, unspecified: Secondary | ICD-10-CM | POA: Diagnosis present

## 2023-07-06 DIAGNOSIS — I2510 Atherosclerotic heart disease of native coronary artery without angina pectoris: Secondary | ICD-10-CM | POA: Diagnosis present

## 2023-07-06 DIAGNOSIS — J4551 Severe persistent asthma with (acute) exacerbation: Secondary | ICD-10-CM | POA: Diagnosis not present

## 2023-07-06 DIAGNOSIS — Z87440 Personal history of urinary (tract) infections: Secondary | ICD-10-CM

## 2023-07-06 DIAGNOSIS — R590 Localized enlarged lymph nodes: Secondary | ICD-10-CM | POA: Diagnosis not present

## 2023-07-06 DIAGNOSIS — R918 Other nonspecific abnormal finding of lung field: Secondary | ICD-10-CM | POA: Diagnosis not present

## 2023-07-06 DIAGNOSIS — Z883 Allergy status to other anti-infective agents status: Secondary | ICD-10-CM

## 2023-07-06 DIAGNOSIS — Z881 Allergy status to other antibiotic agents status: Secondary | ICD-10-CM

## 2023-07-06 LAB — BLOOD GAS, VENOUS
Acid-Base Excess: 1.5 mmol/L (ref 0.0–2.0)
Bicarbonate: 26.6 mmol/L (ref 20.0–28.0)
O2 Saturation: 92 %
Patient temperature: 37
pCO2, Ven: 43 mmHg — ABNORMAL LOW (ref 44–60)
pH, Ven: 7.4 (ref 7.25–7.43)
pO2, Ven: 56 mmHg — ABNORMAL HIGH (ref 32–45)

## 2023-07-06 LAB — CBC WITH DIFFERENTIAL/PLATELET
Abs Immature Granulocytes: 0.02 10*3/uL (ref 0.00–0.07)
Basophils Absolute: 0 10*3/uL (ref 0.0–0.1)
Basophils Relative: 0 %
Eosinophils Absolute: 0.3 10*3/uL (ref 0.0–0.5)
Eosinophils Relative: 3 %
HCT: 33.6 % — ABNORMAL LOW (ref 36.0–46.0)
Hemoglobin: 10.7 g/dL — ABNORMAL LOW (ref 12.0–15.0)
Immature Granulocytes: 0 %
Lymphocytes Relative: 19 %
Lymphs Abs: 1.4 10*3/uL (ref 0.7–4.0)
MCH: 28.7 pg (ref 26.0–34.0)
MCHC: 31.8 g/dL (ref 30.0–36.0)
MCV: 90.1 fL (ref 80.0–100.0)
Monocytes Absolute: 0.7 10*3/uL (ref 0.1–1.0)
Monocytes Relative: 9 %
Neutro Abs: 5.2 10*3/uL (ref 1.7–7.7)
Neutrophils Relative %: 69 %
Platelets: 177 10*3/uL (ref 150–400)
RBC: 3.73 MIL/uL — ABNORMAL LOW (ref 3.87–5.11)
RDW: 13.2 % (ref 11.5–15.5)
WBC: 7.5 10*3/uL (ref 4.0–10.5)
nRBC: 0 % (ref 0.0–0.2)

## 2023-07-06 LAB — TROPONIN I (HIGH SENSITIVITY)
Troponin I (High Sensitivity): 3 ng/L (ref <18)
Troponin I (High Sensitivity): 3 ng/L (ref <18)

## 2023-07-06 LAB — COMPREHENSIVE METABOLIC PANEL
ALT: 11 U/L (ref 0–44)
AST: 16 U/L (ref 15–41)
Albumin: 3.4 g/dL — ABNORMAL LOW (ref 3.5–5.0)
Alkaline Phosphatase: 65 U/L (ref 38–126)
Anion gap: 9 (ref 5–15)
BUN: 14 mg/dL (ref 8–23)
CO2: 23 mmol/L (ref 22–32)
Calcium: 8.1 mg/dL — ABNORMAL LOW (ref 8.9–10.3)
Chloride: 106 mmol/L (ref 98–111)
Creatinine, Ser: 0.69 mg/dL (ref 0.44–1.00)
GFR, Estimated: >60 mL/min (ref >60)
Glucose, Bld: 91 mg/dL (ref 70–99)
Potassium: 3.3 mmol/L — ABNORMAL LOW (ref 3.5–5.1)
Sodium: 138 mmol/L (ref 135–145)
Total Bilirubin: 0.7 mg/dL (ref 0.3–1.2)
Total Protein: 6.9 g/dL (ref 6.5–8.1)

## 2023-07-06 LAB — MRSA NEXT GEN BY PCR, NASAL: MRSA by PCR Next Gen: NOT DETECTED

## 2023-07-06 LAB — BRAIN NATRIURETIC PEPTIDE: B Natriuretic Peptide: 83.4 pg/mL (ref 0.0–100.0)

## 2023-07-06 MED ORDER — ALPRAZOLAM 0.5 MG PO TABS
0.5000 mg | ORAL_TABLET | Freq: Three times a day (TID) | ORAL | Status: DC
  Administered 2023-07-06 – 2023-07-21 (×43): 0.5 mg via ORAL
  Filled 2023-07-06 (×44): qty 1

## 2023-07-06 MED ORDER — ALBUTEROL SULFATE (2.5 MG/3ML) 0.083% IN NEBU
2.5000 mg | INHALATION_SOLUTION | RESPIRATORY_TRACT | Status: DC | PRN
  Administered 2023-07-09: 2.5 mg via RESPIRATORY_TRACT
  Filled 2023-07-06: qty 3

## 2023-07-06 MED ORDER — TRAZODONE HCL 100 MG PO TABS
100.0000 mg | ORAL_TABLET | Freq: Every day | ORAL | Status: DC
  Administered 2023-07-06 – 2023-07-10 (×5): 200 mg via ORAL
  Administered 2023-07-11 – 2023-07-12 (×2): 100 mg via ORAL
  Administered 2023-07-13 – 2023-07-20 (×8): 200 mg via ORAL
  Filled 2023-07-06 (×16): qty 2

## 2023-07-06 MED ORDER — ORAL CARE MOUTH RINSE
15.0000 mL | OROMUCOSAL | Status: DC
  Administered 2023-07-07 – 2023-07-14 (×26): 15 mL via OROMUCOSAL

## 2023-07-06 MED ORDER — METHYLPREDNISOLONE SODIUM SUCC 125 MG IJ SOLR
125.0000 mg | Freq: Once | INTRAMUSCULAR | Status: AC
  Administered 2023-07-06: 125 mg via INTRAVENOUS
  Filled 2023-07-06: qty 2

## 2023-07-06 MED ORDER — OXYCODONE HCL 5 MG PO TABS
5.0000 mg | ORAL_TABLET | Freq: Four times a day (QID) | ORAL | Status: DC | PRN
  Administered 2023-07-06 – 2023-07-11 (×16): 5 mg via ORAL
  Filled 2023-07-06 (×17): qty 1

## 2023-07-06 MED ORDER — IPRATROPIUM-ALBUTEROL 0.5-2.5 (3) MG/3ML IN SOLN
9.0000 mL | Freq: Once | RESPIRATORY_TRACT | Status: AC
  Administered 2023-07-06: 9 mL via RESPIRATORY_TRACT
  Filled 2023-07-06: qty 9

## 2023-07-06 MED ORDER — ORAL CARE MOUTH RINSE: 15.0000 mL | OROMUCOSAL | Status: DC | PRN

## 2023-07-06 MED ORDER — IPRATROPIUM-ALBUTEROL 0.5-2.5 (3) MG/3ML IN SOLN
3.0000 mL | Freq: Four times a day (QID) | RESPIRATORY_TRACT | Status: DC
  Administered 2023-07-06 – 2023-07-08 (×7): 3 mL via RESPIRATORY_TRACT
  Filled 2023-07-06 (×7): qty 3

## 2023-07-06 MED ORDER — ALBUTEROL SULFATE (2.5 MG/3ML) 0.083% IN NEBU: 10.0000 mg/h | INHALATION_SOLUTION | Freq: Once | RESPIRATORY_TRACT | Status: DC

## 2023-07-06 MED ORDER — GUAIFENESIN 100 MG/5ML PO LIQD
10.0000 mL | Freq: Once | ORAL | Status: AC
  Administered 2023-07-06: 10 mL via ORAL
  Filled 2023-07-06: qty 10

## 2023-07-06 MED ORDER — ALBUTEROL SULFATE (2.5 MG/3ML) 0.083% IN NEBU
2.5000 mg | INHALATION_SOLUTION | Freq: Once | RESPIRATORY_TRACT | Status: AC
  Administered 2023-07-06: 2.5 mg via RESPIRATORY_TRACT
  Filled 2023-07-06: qty 3

## 2023-07-06 MED ORDER — IOHEXOL 300 MG/ML  SOLN
75.0000 mL | Freq: Once | INTRAMUSCULAR | Status: AC | PRN
  Administered 2023-07-06: 75 mL via INTRAVENOUS

## 2023-07-06 MED ORDER — ENOXAPARIN SODIUM 40 MG/0.4ML IJ SOSY
40.0000 mg | PREFILLED_SYRINGE | INTRAMUSCULAR | Status: DC
  Administered 2023-07-06 – 2023-07-20 (×15): 40 mg via SUBCUTANEOUS
  Filled 2023-07-06 (×15): qty 0.4

## 2023-07-06 MED ORDER — DULOXETINE HCL 30 MG PO CPEP
30.0000 mg | ORAL_CAPSULE | Freq: Every day | ORAL | Status: DC
  Administered 2023-07-07 – 2023-07-21 (×15): 30 mg via ORAL
  Filled 2023-07-06 (×15): qty 1

## 2023-07-06 MED ORDER — GUAIFENESIN-DM 100-10 MG/5ML PO SYRP: 5.0000 mL | ORAL_SOLUTION | ORAL | Status: DC | PRN

## 2023-07-06 MED ORDER — OXYCODONE-ACETAMINOPHEN 5-325 MG PO TABS
1.0000 | ORAL_TABLET | Freq: Four times a day (QID) | ORAL | Status: DC | PRN
  Administered 2023-07-06 – 2023-07-11 (×16): 1 via ORAL
  Filled 2023-07-06 (×17): qty 1

## 2023-07-06 MED ORDER — CHLORHEXIDINE GLUCONATE CLOTH 2 % EX PADS
6.0000 | MEDICATED_PAD | Freq: Every day | CUTANEOUS | Status: DC
  Administered 2023-07-07 – 2023-07-14 (×8): 6 via TOPICAL

## 2023-07-06 MED ORDER — OXYCODONE-ACETAMINOPHEN 10-325 MG PO TABS: 1.0000 | ORAL_TABLET | Freq: Four times a day (QID) | ORAL | Status: DC | PRN

## 2023-07-06 MED ORDER — POTASSIUM CHLORIDE CRYS ER 20 MEQ PO TBCR
40.0000 meq | EXTENDED_RELEASE_TABLET | Freq: Once | ORAL | Status: AC
  Administered 2023-07-06: 40 meq via ORAL
  Filled 2023-07-06: qty 2

## 2023-07-06 MED ORDER — LAMOTRIGINE 100 MG PO TABS
100.0000 mg | ORAL_TABLET | Freq: Every day | ORAL | Status: DC
  Administered 2023-07-07: 100 mg via ORAL
  Filled 2023-07-06: qty 1

## 2023-07-06 MED ORDER — METHYLPREDNISOLONE SODIUM SUCC 125 MG IJ SOLR
60.0000 mg | INTRAMUSCULAR | Status: DC
  Administered 2023-07-07 – 2023-07-08 (×2): 60 mg via INTRAVENOUS
  Filled 2023-07-06 (×2): qty 2

## 2023-07-06 NOTE — Consult Note (Signed)
NAME:  CHEALSY HARVATH, MRN:  [id number], DOB:  22-Nov-1946, LOS: 0 ADMISSION DATE:  07/06/2023, CONSULTATION DATE: 07/06/2023 REFERRING MD: Dr. Loney Loh, CHIEF COMPLAINT: Respiratory distress  History of Present Illness:  77 year old woman with pleural plaques presumed due to asbestos exposure without interstitial lung disease, OSA not on CPAP, hypertension with chronic HFpEF, CAD, history of pulmonary embolism no longer on anticoagulation, anxiety/depression, and vocal cord dysfunction.  She has been seen remotely in our office by Dr. Marchelle Gearing, more recently by Dr. Su Monks at [facility].  Recently started by him on Breztri although unclear whether she has true COPD.  Chronic hypoxemic respiratory failure on 3 L/min at baseline She states that she has been having progressive dyspnea and increased work of breathing for about 6 weeks, unclear precipitant..  She responded temporarily to a short course of prednisone about 1 month ago.  She apparently ran out of her home nebulized medication and reports that her oxygen concentrator broke 24 hours to presentation, precipitated progressive respiratory distress, work of breathing.  She presented to [facility] ED 7/14 with progressive hypoxemia, tachypnea, respiratory distress.  She was placed on 1.00 NRB, received extended DuoNeb, Solu-Medrol.  She has improved clinically, now back down to 3 L/min but remains tachypneic with a gasping respiratory pattern especially when she speaks or coughs.  Pertinent  Medical History   Past Medical History:  Diagnosis Date   Acute upper GI bleed 06/2014   while pt taking coumadin, plavix, and meloxicam---despite being told not to take coumadin.   Anginal pain (HCC)    Nonobstructive CAD 2014; however, her cardiologist put her on a statin for this and NOT for hyperlipidemia per pt report.  Atyp CP 08/2017 at card f/u, plan for myoc perf imaging.   Anxiety    panic attacks   Asthma    w/ asbestososis    BPPV  (benign paroxysmal positional vertigo) 12/16/2012   CAD (coronary artery disease)    Nonobstructive ([place] [facility])   Chronic diastolic CHF (congestive heart failure) (HCC)    dry wt as of 11/06/16 is 168 lbs.   Chronic lower back pain    COPD (chronic obstructive pulmonary disease) (HCC)    DDD (degenerative disc disease)    lumbar and cervical.    Diverticular disease    Fibromyalgia    Patient states dx was around her late 23s but she had sx's for years prior to this.   H/O hiatal hernia    History of pneumonia    hospitalized 12/2011, 02/2013, and 07/2013 [facility]) for this   HTN (hypertension)    Renal artery dopplers 04/2013 neg for stenosis.   Hypervitaminosis D 09/27/2019   over-supplemented.  Stopped vit D and plan recheck 2 mo.   Idiopathic angio-edema-urticaria 40981; 2021   Angioedema component was very minimal.  2021->Dr. Bobbitt (allergist) eval.   Insomnia    Iron deficiency anemia    Hematologist in [place], [place] did extensive w/u; no cause found; failed oral supplement;; gets fairly regular (q56m or so) IV iron infusions (Venofer -iron sucrose- 200mg  with procrit.  "for 14 yr I've been getting blood work q month & getting infusions prn" (07/12/2013).  Dr. Myna Hidalgo locally, iron infusions done, EPO deficiency dx'd   Kidney stone    Migraine syndrome    "not as often anymore; used to be ~ q wk" (07/12/2013)   Mixed incontinence urge and stress    Nephrolithiasis    "passed all on my own or they are  still in there" (07/12/2013)   Neuroleptic induced parkinsonism (HCC) 2018   Dr. Arbutus Leas, neuro, saw her 11/24/17 and recommended d/c of abilify as first step.  D/c'd abilify and pt got complete recovery.   Oropharyngeal dysphagia    swallowing study speech path 05/2020. Gastric bx's showed gastritis, h pylori NEG   OSA on CPAP    prior to move to [place]--had another sleep study 10/2015 w/pulm Dr. Su Monks.   Osteoarthritis    "severe; progressing fast" (07/12/2013); multiple joints-not surgical  candidate for TKR (03/2015).  Triamcinolon knee injections by Dr. Hermelinda Medicus 12/2017.   Pernicious anemia 08/24/2014   Pleural plaque with presence of asbestos 07/22/2013   Pulmonary embolism (HCC) 07/2013   Dx at [facility] with very small peripheral upper lobe pe 07/2013: pt took coumadin for about 8-9 mo   Pyelonephritis    "several times over the last 30 yr" (07/12/2013)   RBBB (right bundle branch block)    Recurrent major depression (HCC)    Recurrent UTI    hx of hospitalization for pyelonephritis; started abx prophylaxis 06/2015   Syncope    Hypotensive; ED visit--Dr. Sharyn Lull did Cath--nonobstructive CAD, EF 55-60%.  In retrospect, suspect pt rx med misuse/polypharmacy   Vertebral compression fracture (HCC)    Acute T12 on 02/27/22 (fall).  multiple old thoracic-->neurosurg to do MRI as of 05/2022     Significant Hospital Events: Including procedures, antibiotic start and stop dates in addition to other pertinent events   CT chest 7/14>> stable pleural thickening and associated calcifications/plaques most notable in the left lower lobe.  No new infiltrates or progression of pleural disease compared with cardiac CT 04/02/2023  Interim History / Subjective:  Feeling some better, still dyspneic, denies any throat pain or recent URI that would be a precipitant of upper airway irritation O2 has been weaned back to her baseline 3 L/min  Objective   Blood pressure (!) 170/86, pulse 84, temperature 98 F (36.7 C), temperature source Oral, resp. rate 16, height 5\' 4"  (1.626 m), weight 67.6 kg, SpO2 93%.       No intake or output data in the 24 hours ending 07/06/23 2035 Filed Weights   07/06/23 1500  Weight: 67.6 kg    Examination: General: Elderly anxious woman, mild respiratory distress but able to be calmed HENT: Oropharynx clear, no erythema, no exudate or evidence of pharyngitis, minimal upper airway noise without overt stridor.  Weak voice and stuttering respiratory pattern with  phonation Lungs: Stuttering breaths but principally due to upper airway obstruction, good air movement bilaterally without any wheezes or crackles Cardiovascular: Regular, borderline tachycardic, no murmur Abdomen: Nondistended with positive bowel sounds Extremities: No edema Neuro: Wake, alert, anxious but redirectable.  Follows commands, moves all extremities GU: Deferred  Resolved Hospital Problem list     Assessment & Plan:   Vocal dysfunction with acute flare, unclear precipitant but with an apparent underlying anxiety component -Agree with corticosteroids, transition to prednisone and taper on 7/15 -Okay to continue scheduled DuoNeb although explained to her that her airflow obstruction is more upper airway than lower airway and that she may not get full relief -Voice rest -She did benefit on my exam from some distraction efforts, pursed lip breathing, vocal exercises including humming -Okay to treat her underlying anxiety with her baseline Xanax.  She is also concerned that she will get her baseline oxycodone.  Okay to use this, little risk for respiratory suppression in this setting -Hold off on Robaxin -Okay to continue her  daily Lamictal, Cymbalta -Will order her bedtime trazodone but at lower dose (she uses 100-200 mg nightly at home)  Pleural plaques consistent with asbestos lung disease.  CT chest stable -No evidence of ILD, pneumonitis consistent with asbestosis flare, reassured her about this -Supportive care -Repeat imaging, likely repeat CT in July 2025 to ensure plaques are stable  OSA, not currently on CPAP -Initially considered whether she may need either CPAP or BiPAP for support of her respiratory distress, no clear indication to do so at this time given her improvement -She needs a dedicated outpatient sleep study and possible restart CPAP as per Dr Fransisca Connors plans  History of pulmonary embolism -No longer on anticoagulation -Low suspicion for recurrence given  the localization of her symptoms to the upper airway, baseline O2 needs.  Could consider further workup if she decompensates  Chronic hypoxemic respiratory failure -Now back to her baseline 3 L/min    Labs   CBC: Recent Labs  Lab 07/06/23 1324  WBC 7.5  NEUTROABS 5.2  HGB 10.7*  HCT 33.6*  MCV 90.1  PLT 177    Basic Metabolic Panel: Recent Labs  Lab 07/06/23 1324  NA 138  K 3.3*  CL 106  CO2 23  GLUCOSE 91  BUN 14  CREATININE 0.69  CALCIUM 8.1*   GFR: Estimated Creatinine Clearance: 55.7 mL/min (by C-G formula based on SCr of 0.69 mg/dL). Recent Labs  Lab 07/06/23 1324  WBC 7.5    Liver Function Tests: Recent Labs  Lab 07/06/23 1324  AST 16  ALT 11  ALKPHOS 65  BILITOT 0.7  PROT 6.9  ALBUMIN 3.4*   No results for input(s): "LIPASE", "AMYLASE" in the last 168 hours. No results for input(s): "AMMONIA" in the last 168 hours.  ABG    Component Value Date/Time   PHART 7.430 07/20/2013 2000   PCO2ART 41.0 07/20/2013 2000   PO2ART 57.4 (L) 07/20/2013 2000   HCO3 26.6 07/06/2023 1324   TCO2 30 02/27/2022 0156   ACIDBASEDEF 1.1 07/13/2013 1435   O2SAT 92 07/06/2023 1324     Coagulation Profile: No results for input(s): "INR", "PROTIME" in the last 168 hours.  Cardiac Enzymes: No results for input(s): "CKTOTAL", "CKMB", "CKMBINDEX", "TROPONINI" in the last 168 hours.  HbA1C: Hgb A1c MFr Bld  Date/Time Value Ref Range Status  02/27/2022 04:42 AM 5.3 4.8 - 5.6 % Final    Comment:    (NOTE) Pre diabetes:          5.7%-6.4%  Diabetes:              >6.4%  Glycemic control for   <7.0% adults with diabetes   01/02/2016 06:10 AM 5.6 4.8 - 5.6 % Final    Comment:    (NOTE)         Pre-diabetes: 5.7 - 6.4         Diabetes: >6.4         Glycemic control for adults with diabetes: <7.0     CBG: No results for input(s): "GLUCAP" in the last 168 hours.  Review of Systems:   As per HPI  Past Medical History:  She,  has a past medical history  of Acute upper GI bleed (06/2014), Anginal pain (HCC), Anxiety, Asthma, BPPV (benign paroxysmal positional vertigo) (12/16/2012), CAD (coronary artery disease), Chronic diastolic CHF (congestive heart failure) (HCC), Chronic lower back pain, COPD (chronic obstructive pulmonary disease) (HCC), DDD (degenerative disc disease), Diverticular disease, Fibromyalgia, H/O hiatal hernia, History of pneumonia, HTN (hypertension),  Hypervitaminosis D (09/27/2019), Idiopathic angio-edema-urticaria (16109; 2021), Insomnia, Iron deficiency anemia, Kidney stone, Migraine syndrome, Mixed incontinence urge and stress, Nephrolithiasis, Neuroleptic induced parkinsonism (HCC) (2018), Oropharyngeal dysphagia, OSA on CPAP, Osteoarthritis, Pernicious anemia (08/24/2014), Pleural plaque with presence of asbestos (07/22/2013), Pulmonary embolism (HCC) (07/2013), Pyelonephritis, RBBB (right bundle branch block), Recurrent major depression (HCC), Recurrent UTI, Syncope, and Vertebral compression fracture (HCC).   Surgical History:   Past Surgical History:  Procedure Laterality Date   APPENDECTOMY  1960   AXILLARY SURGERY Left 1978   Multiple "lump" in armpit per pt   BIOPSY  06/17/2020   Gastric bx->gastritis, h pylori neg.  Procedure: BIOPSY;  Surgeon: Jeani Hawking, MD;  Location: [facility];  Service: Endoscopy;;   CARDIAC CATHETERIZATION  01/2013   nonobstructive CAD, EF 55-60%   CARDIOVASCULAR STRESS TEST  02/22/2015   Low risk myocard perf imaging; wall motion normal, normal EF   CARDIOVASCULAR STRESS TEST     09/2017 myo perf low risk   carotid duplex doppler  10/21/2017   01/2023 1-30% bilat-->rpt 1 yr   COCCYX REMOVAL  1972   CT CTA CORONARY W/CA SCORE W/CM &/OR WO/CM     04/02/23 96%'tile calcium score, nonobst CAD-->crestor increased to 40   DEXA  06/05/2017   T-score -3.1   DILATION AND CURETTAGE OF UTERUS  ? 1970's   ESOPHAGOGASTRODUODENOSCOPY N/A 07/19/2014   Gastritis found + in the setting of  supratherapeutic INR, +plavix, + meloxicam.   ESOPHAGOGASTRODUODENOSCOPY (EGD) WITH PROPOFOL N/A 06/17/2020   NO stricture or other prob to explain pt's dysphagia, dilation was done anyway.  Gastric bx's-->gastritis, h pylori neg. Procedure: ESOPHAGOGASTRODUODENOSCOPY (EGD) WITH PROPOFOL;  Surgeon: Jeani Hawking, MD;  Location: [facility];  Service: Endoscopy;  Laterality: N/A;   EYE SURGERY Left 2012-2013   "injections for ~ 1 yr; don't really know what for" (07/12/2013)   HEEL SPUR SURGERY Left 2008   kidney stone removal Right    KNEE SURGERY  2005   LEFT HEART CATHETERIZATION WITH CORONARY ANGIOGRAM N/A 01/30/2013   Procedure: LEFT HEART CATHETERIZATION WITH CORONARY ANGIOGRAM;  Surgeon: Robynn Pane, MD;  Location: [facility];  Service: Cardiovascular;  Laterality: N/A;   MALONEY DILATION  06/17/2020   Procedure: Elease Hashimoto DILATION;  Surgeon: Jeani Hawking, MD;  Location: [facility];  Service: Endoscopy;;   PLANTAR FASCIA RELEASE Left 2008   SPIROMETRY  04/25/2014   In hosp for acute asthma/COPD flare: mixed obstructive and restrictive lung disease. The FEV1 is severely reduced at 45% predicted.  FEV1 signif decreased compared to prior spirometry 07/23/13.   TENDON RELEASE  1996   Right forearm and hand   TOTAL ABDOMINAL HYSTERECTOMY  1974   TRANSTHORACIC ECHOCARDIOGRAM  01/2013; 04/2014;08/2015; 09/2017   2014--NORMAL.  2015--focal basal septal hypertrophy, EF 55-60%, grade I diast dysfxn, mild LAE.  08/2015 EF 55-60%, nl LV syst fxn, grade I DD, valves wnl. 10/21/17: EF 65-70%, grd I DD, o/w normal. 02/17/22 EF 70-75%, hyperdynamic LV fxn, grd I DD.     Social History:   reports that she has never smoked. She has never used smokeless tobacco. She reports that she does not drink alcohol and does not use drugs.   Family History:  Her family history includes Arthritis in her mother; Asthma in her brother, daughter, and sister; Diabetes in her father and sister; Heart attack in her father  and paternal grandmother; Heart disease in her father; Hypertension in her brother, father, and sister; Kidney disease in her mother; Multiple sclerosis  in her son; Stroke in her father.   Allergies Allergies  Allergen Reactions   Aripiprazole Rash and Other (See Comments)    Parkinsonism, also   Penicillins Shortness Of Breath, Itching, Swelling, Rash and Other (See Comments)    Tolerated Cefepime in ED   Bactrim [Sulfamethoxazole-Trimethoprim] Rash     Home Medications  Prior to Admission medications   Medication Sig Start Date End Date Taking? Authorizing Provider  metoprolol succinate (TOPROL-XL) 100 MG 24 hr tablet take ONE tab daily, take additional 1/2 tab as needed FOR FOR BLOOD PRESSURE > 160/90 Patient taking differently: Take 50-100 mg by mouth See admin instructions. Take 100 mg by mouth once a day and an additional 50 mg as needed for a B/P greater than 160/90 01/01/23  Yes McGowen, Maryjean Morn, MD  albuterol (VENTOLIN HFA) 108 (90 Base) MCG/ACT inhaler Inhale 1-2 puffs into the lungs every 6 (six) hours as needed for wheezing or shortness of breath. 01/01/23   McGowen, Maryjean Morn, MD  ALPRAZolam Prudy Feeler) 0.5 MG tablet TAKE ONE TABLET BY MOUTH THREE TIMES DAILY Patient taking differently: Take 0.5 mg by mouth 3 (three) times daily. 02/24/23   McGowen, Maryjean Morn, MD  amLODipine (NORVASC) 5 MG tablet TAKE ONE TABLET BY MOUTH DAILY 02/24/23   McGowen, Maryjean Morn, MD  aspirin 81 MG tablet Take 81 mg by mouth at bedtime.    [provider]  benzonatate (TESSALON) 100 MG capsule Take by mouth. 06/03/23   [provider]  BREZTRI AEROSPHERE 160-9-4.8 MCG/ACT AERO Inhale into the lungs. 06/03/23   [provider]  buprenorphine (BUTRANS) 5 MCG/HR PTWK Place 1 patch onto the skin once a week. 07/02/23   Ranelle Oyster, MD  diclofenac Sodium (VOLTAREN) 1 % GEL Apply 2 g topically 4 (four) times daily. 4 times a day to both knees Patient taking differently: Apply 2 g topically  4 (four) times daily as needed (pain). 08/07/22   Ranelle Oyster, MD  DULoxetine (CYMBALTA) 60 MG capsule Take 1 capsule (60 mg total) by mouth daily. 04/10/23   McGowen, Maryjean Morn, MD  EPINEPHrine 0.3 mg/0.3 mL IJ SOAJ injection Inject 0.3 mg into the muscle as needed for anaphylaxis. 12/28/20   McGowen, Maryjean Morn, MD  ipratropium (ATROVENT) 0.03 % nasal spray Place 2 sprays into both nostrils every 12 (twelve) hours as needed (allergies). 01/01/23   McGowen, Maryjean Morn, MD  ipratropium-albuterol (DUONEB) 0.5-2.5 (3) MG/3ML SOLN Take 3 mLs by nebulization every 4 (four) hours as needed. Patient taking differently: Take 3 mLs by nebulization every 4 (four) hours as needed (sob/wheezing). 05/30/23   Wallis Bamberg, PA-C  isosorbide mononitrate (IMDUR) 30 MG 24 hr tablet Take 2 tablets (60 mg total) by mouth in the morning and at bedtime. 04/30/23   Joylene Grapes, NP  lamoTRIgine (LAMICTAL) 200 MG tablet Take 1 tablet (200 mg total) by mouth daily. 01/01/23   McGowen, Maryjean Morn, MD  methocarbamol (ROBAXIN) 500 MG tablet Take 1 tablet (500 mg total) by mouth every 8 (eight) hours as needed for muscle spasms. 07/02/23   Ranelle Oyster, MD  metoprolol tartrate (LOPRESSOR) 50 MG tablet Take 2 hours prior to CT scan 01/22/23   Lewayne Bunting, MD  Multiple Vitamin (MULTIVITAMIN WITH MINERALS) TABS tablet Take 1 tablet by mouth daily.    [provider]  nitroGLYCERIN (NITROSTAT) 0.4 MG SL tablet Place 1 tablet (0.4 mg total) under the tongue every 5 (five) minutes as needed for chest pain (  x 3 doses). 01/08/23   Lewayne Bunting, MD  oxyCODONE-acetaminophen (PERCOCET) 10-325 MG tablet Take 1 tablet by mouth every 6 (six) hours as needed for pain. 07/02/23   Ranelle Oyster, MD  pantoprazole (PROTONIX) 40 MG tablet Take 1 tablet (40 mg total) by mouth 2 (two) times daily. 01/01/23   McGowen, Maryjean Morn, MD  predniSONE (DELTASONE) 20 MG tablet  04/07/23   [provider]  rOPINIRole (REQUIP) 0.5 MG tablet  TAKE ONE TABLET BY MOUTH AT BEDTIME 01/03/23   McGowen, Maryjean Morn, MD  rosuvastatin (CRESTOR) 40 MG tablet Take 1 tablet (40 mg total) by mouth every evening. 04/02/23   Lewayne Bunting, MD  solifenacin (VESICARE) 10 MG tablet TAKE ONE TABLET BY MOUTH ONCE DAILY **NEED OFFICE VISIT** FOR FURTHER REFILLS 05/12/23   Jeoffrey Massed, MD  traZODone (DESYREL) 50 MG tablet take 2-4 tablets at bedtime as needed for insomnia. Patient taking differently: Take 100-200 mg by mouth at bedtime as needed for sleep. 12/09/22   McGowen, Maryjean Morn, MD     Critical care time: NA      Levy Pupa, MD, PhD 07/06/2023, 8:52 PM [facility] [phone] or if no answer before 7:00PM call [phone] For any issues after 7:00PM please call eLink [phone]

## 2023-07-06 NOTE — H&P (Signed)
History and Physical    Linda [name] YNW:[id number] DOB: [birth year] DOA: 2023/07/17  PCP: Jeoffrey Massed, MD  Patient coming from: Home  Chief Complaint: Shortness of breath  HPI: Linda [name] is a 77 y.o. female with medical history significant of pulmonary asbestosis, chronic hypoxemic respiratory failure on 3 L home oxygen, OSA, chronic HFpEF, history of PE, chronic pain, anxiety/depression, nonobstructive CAD, hypertension, idiopathic angioedema-urticaria, neuroleptic induced parkinsonism. Patient was previously going to [facility] pulmonology more than 3 years ago and recently established care with pulmonology at [facility] last month due to persistent shortness of breath, coughing, and wheezing.  She was placed on a triple agent inhaler.  Plan was to get PFTs and repeat sleep study as she is currently not on CPAP.  Pulmonology felt that the patient did not have COPD but rather has history of vocal cord dysfunction.  Patient was advised to minimize use of her home sedative medications.  Patient presented to the ED today with shortness of breath.  Brought in by EMS on nonrebreather due to severe respiratory distress with accessory muscle use and wheezing.  Patient's oxygen concentrator broke at home and she has been without it for at least 24 hours.  Afebrile.  Labs showing no leukocytosis, hemoglobin 10.7 (baseline 12-13), MCV 90.1, potassium 3.3, troponin negative x 2, BNP normal, VBG showing pH 7.4 and pCO2 43.  CT chest with contrast showing: "IMPRESSION: 1. Heavy bilateral pleural calcifications with associated linear and nodular pleural and subpleural thickening. Most prominent nodular thickening is seen in the left lower lobe. Findings are consistent with asbestos related pleural disease or posttraumatic changes. Malignancy is not excluded. Further evaluation with PET-CT may be considered when clinically appropriate. 2. Borderline enlarged mediastinal and hilar lymph  nodes, stable. 3. Enlarged cardiac silhouette. 4. Calcific atherosclerotic disease of the coronary arteries and aorta. 5. 8 mm hypoattenuated lesion within the right thyroid gland. Not clinically significant; no follow-up imaging recommended (ref: J Am Coll Radiol. 2015 Feb;12(2): 143-50). 6. Compression deformity of T7, T9 and T12 vertebral bodies, stable when compared to 07/17/2023, CT of the thoracic spine. 7. Aortic atherosclerosis."  Patient was given bronchodilator treatments in the ED including hour-long albuterol neb, DuoNeb and was given Solu-Medrol 125 mg and Robitussin.  TRH called to admit.  Patient reports 6 week history of progressively worsening shortness of breath, dry cough, and wheezing.  She is chronically on 3 L home oxygen and has been out of her home oxygen for the past 24 hours due to issues with the concentrator.  Patient states even when she is on oxygen, she usually sats between 91-92%.  She was seen by pulmonology last month and was prescribed Breztri which she has been using but ran out of her home nebulizer medication.  She denies fevers or chest pain.  She takes medications for chronic pain and anxiety and is requesting her home medications.  Patient states she no longer wishes to go back to pulmonology at [facility] and is interested in establishing care with a new pulmonologist here in [place].  Review of Systems:  Review of Systems  All other systems reviewed and are negative.   Past Medical History:  Diagnosis Date   Acute upper GI bleed 06/2014   while pt taking coumadin, plavix, and meloxicam---despite being told not to take coumadin.   Anginal pain (HCC)    Nonobstructive CAD 2014; however, her cardiologist put her on a statin for this and NOT for hyperlipidemia per  pt report.  Atyp CP 08/2017 at card f/u, plan for myoc perf imaging.   Anxiety    panic attacks   Asthma    w/ asbestososis    BPPV (benign paroxysmal positional vertigo)  12/16/2012   CAD (coronary artery disease)    Nonobstructive ([place] [facility])   Chronic diastolic CHF (congestive heart failure) (HCC)    dry wt as of 11/06/16 is 168 lbs.   Chronic lower back pain    COPD (chronic obstructive pulmonary disease) (HCC)    DDD (degenerative disc disease)    lumbar and cervical.    Diverticular disease    Fibromyalgia    Patient states dx was around her late 78s but she had sx's for years prior to this.   H/O hiatal hernia    History of pneumonia    hospitalized 12/2011, 02/2013, and 07/2013 [facility]) for this   HTN (hypertension)    Renal artery dopplers 04/2013 neg for stenosis.   Hypervitaminosis D 09/27/2019   over-supplemented.  Stopped vit D and plan recheck 2 mo.   Idiopathic angio-edema-urticaria 40981; 2021   Angioedema component was very minimal.  2021->Dr. Bobbitt (allergist) eval.   Insomnia    Iron deficiency anemia    Hematologist in [place], [place] did extensive w/u; no cause found; failed oral supplement;; gets fairly regular (q35m or so) IV iron infusions (Venofer -iron sucrose- 200mg  with procrit.  "for 14 yr I've been getting blood work q month & getting infusions prn" (07/12/2013).  Dr. Myna Hidalgo locally, iron infusions done, EPO deficiency dx'd   Kidney stone    Migraine syndrome    "not as often anymore; used to be ~ q wk" (07/12/2013)   Mixed incontinence urge and stress    Nephrolithiasis    "passed all on my own or they are still in there" (07/12/2013)   Neuroleptic induced parkinsonism (HCC) 2018   Dr. Arbutus Leas, neuro, saw her 11/24/17 and recommended d/c of abilify as first step.  D/c'd abilify and pt got complete recovery.   Oropharyngeal dysphagia    swallowing study speech path 05/2020. Gastric bx's showed gastritis, h pylori NEG   OSA on CPAP    prior to move to [place]--had another sleep study 10/2015 w/pulm Dr. Su Monks.   Osteoarthritis    "severe; progressing fast" (07/12/2013); multiple joints-not surgical candidate for TKR (03/2015).  Triamcinolon  knee injections by Dr. Hermelinda Medicus 12/2017.   Pernicious anemia 08/24/2014   Pleural plaque with presence of asbestos 07/22/2013   Pulmonary embolism (HCC) 07/2013   Dx at [facility] with very small peripheral upper lobe pe 07/2013: pt took coumadin for about 8-9 mo   Pyelonephritis    "several times over the last 30 yr" (07/12/2013)   RBBB (right bundle branch block)    Recurrent major depression (HCC)    Recurrent UTI    hx of hospitalization for pyelonephritis; started abx prophylaxis 06/2015   Syncope    Hypotensive; ED visit--Dr. Sharyn Lull did Cath--nonobstructive CAD, EF 55-60%.  In retrospect, suspect pt rx med misuse/polypharmacy   Vertebral compression fracture (HCC)    Acute T12 on 02/27/22 (fall).  multiple old thoracic-->neurosurg to do MRI as of 05/2022    Past Surgical History:  Procedure Laterality Date   APPENDECTOMY  1960   AXILLARY SURGERY Left 1978   Multiple "lump" in armpit per pt   BIOPSY  06/17/2020   Gastric bx->gastritis, h pylori neg.  Procedure: BIOPSY;  Surgeon: Jeani Hawking, MD;  Location: [facility];  Service: Endoscopy;;  CARDIAC CATHETERIZATION  01/2013   nonobstructive CAD, EF 55-60%   CARDIOVASCULAR STRESS TEST  02/22/2015   Low risk myocard perf imaging; wall motion normal, normal EF   CARDIOVASCULAR STRESS TEST     09/2017 myo perf low risk   carotid duplex doppler  10/21/2017   01/2023 1-30% bilat-->rpt 1 yr   COCCYX REMOVAL  1972   CT CTA CORONARY W/CA SCORE W/CM &/OR WO/CM     04/02/23 96%'tile calcium score, nonobst CAD-->crestor increased to 40   DEXA  06/05/2017   T-score -3.1   DILATION AND CURETTAGE OF UTERUS  ? 1970's   ESOPHAGOGASTRODUODENOSCOPY N/A 07/19/2014   Gastritis found + in the setting of supratherapeutic INR, +plavix, + meloxicam.   ESOPHAGOGASTRODUODENOSCOPY (EGD) WITH PROPOFOL N/A 06/17/2020   NO stricture or other prob to explain pt's dysphagia, dilation was done anyway.  Gastric bx's-->gastritis, h pylori neg. Procedure:  ESOPHAGOGASTRODUODENOSCOPY (EGD) WITH PROPOFOL;  Surgeon: Jeani Hawking, MD;  Location: [facility];  Service: Endoscopy;  Laterality: N/A;   EYE SURGERY Left 2012-2013   "injections for ~ 1 yr; don't really know what for" (07/12/2013)   HEEL SPUR SURGERY Left 2008   kidney stone removal Right    KNEE SURGERY  2005   LEFT HEART CATHETERIZATION WITH CORONARY ANGIOGRAM N/A 01/30/2013   Procedure: LEFT HEART CATHETERIZATION WITH CORONARY ANGIOGRAM;  Surgeon: Robynn Pane, MD;  Location: [facility];  Service: Cardiovascular;  Laterality: N/A;   MALONEY DILATION  06/17/2020   Procedure: Elease Hashimoto DILATION;  Surgeon: Jeani Hawking, MD;  Location: [facility];  Service: Endoscopy;;   PLANTAR FASCIA RELEASE Left 2008   SPIROMETRY  04/25/2014   In hosp for acute asthma/COPD flare: mixed obstructive and restrictive lung disease. The FEV1 is severely reduced at 45% predicted.  FEV1 signif decreased compared to prior spirometry 07/23/13.   TENDON RELEASE  1996   Right forearm and hand   TOTAL ABDOMINAL HYSTERECTOMY  1974   TRANSTHORACIC ECHOCARDIOGRAM  01/2013; 04/2014;08/2015; 09/2017   2014--NORMAL.  2015--focal basal septal hypertrophy, EF 55-60%, grade I diast dysfxn, mild LAE.  08/2015 EF 55-60%, nl LV syst fxn, grade I DD, valves wnl. 10/21/17: EF 65-70%, grd I DD, o/w normal. 02/17/22 EF 70-75%, hyperdynamic LV fxn, grd I DD.     reports that she has never smoked. She has never used smokeless tobacco. She reports that she does not drink alcohol and does not use drugs.  Allergies  Allergen Reactions   Aripiprazole Rash and Other (See Comments)    Parkinsonism, also   Penicillins Shortness Of Breath, Itching, Swelling, Rash and Other (See Comments)    Tolerated Cefepime in ED   Bactrim [Sulfamethoxazole-Trimethoprim] Rash    Family History  Problem Relation Age of Onset   Arthritis Mother    Kidney disease Mother    Heart disease Father    Stroke Father    Hypertension Father    Diabetes  Father    Heart attack Father    Heart attack Paternal Grandmother    Diabetes Sister        one sister   Hypertension Sister    Asthma Sister    Hypertension Brother    Asthma Brother    Asthma Daughter    Multiple sclerosis Son     Prior to Admission medications   Medication Sig Start Date End Date Taking? Authorizing Provider  albuterol (VENTOLIN HFA) 108 (90 Base) MCG/ACT inhaler Inhale 1-2 puffs into the lungs every 6 (six) hours as needed  for wheezing or shortness of breath. 01/01/23   McGowen, Maryjean Morn, MD  ALPRAZolam Prudy Feeler) 0.5 MG tablet TAKE ONE TABLET BY MOUTH THREE TIMES DAILY 02/24/23   McGowen, Maryjean Morn, MD  amLODipine (NORVASC) 5 MG tablet TAKE ONE TABLET BY MOUTH DAILY 02/24/23   McGowen, Maryjean Morn, MD  aspirin 81 MG tablet Take 81 mg by mouth at bedtime.    [provider]  benzonatate (TESSALON) 100 MG capsule Take by mouth. 06/03/23   [provider]  BREZTRI AEROSPHERE 160-9-4.8 MCG/ACT AERO Inhale into the lungs. 06/03/23   [provider]  buprenorphine (BUTRANS) 5 MCG/HR PTWK Place 1 patch onto the skin once a week. 07/02/23   Ranelle Oyster, MD  diclofenac Sodium (VOLTAREN) 1 % GEL Apply 2 g topically 4 (four) times daily. 4 times a day to both knees Patient taking differently: Apply 2 g topically 4 (four) times daily as needed (pain). 08/07/22   Ranelle Oyster, MD  DULoxetine (CYMBALTA) 60 MG capsule Take 1 capsule (60 mg total) by mouth daily. 04/10/23   McGowen, Maryjean Morn, MD  EPINEPHrine 0.3 mg/0.3 mL IJ SOAJ injection Inject 0.3 mg into the muscle as needed for anaphylaxis. 12/28/20   McGowen, Maryjean Morn, MD  ipratropium (ATROVENT) 0.03 % nasal spray Place 2 sprays into both nostrils every 12 (twelve) hours as needed (allergies). 01/01/23   McGowen, Maryjean Morn, MD  ipratropium-albuterol (DUONEB) 0.5-2.5 (3) MG/3ML SOLN Take 3 mLs by nebulization every 4 (four) hours as needed. Patient taking differently: Take 3 mLs by nebulization every 4 (four)  hours as needed (sob/wheezing). 05/30/23   Wallis Bamberg, PA-C  isosorbide mononitrate (IMDUR) 30 MG 24 hr tablet Take 2 tablets (60 mg total) by mouth in the morning and at bedtime. 04/30/23   Joylene Grapes, NP  lamoTRIgine (LAMICTAL) 200 MG tablet Take 1 tablet (200 mg total) by mouth daily. 01/01/23   McGowen, Maryjean Morn, MD  methocarbamol (ROBAXIN) 500 MG tablet Take 1 tablet (500 mg total) by mouth every 8 (eight) hours as needed for muscle spasms. 07/02/23   Ranelle Oyster, MD  metoprolol succinate (TOPROL-XL) 100 MG 24 hr tablet take ONE tab daily, take additional 1/2 tab as needed FOR FOR BLOOD PRESSURE > 160/90 Patient taking differently: Take 50-100 mg by mouth as directed. take ONE tab daily, take additional 1/2 tab as needed FOR FOR BLOOD PRESSURE > 160/90 01/01/23   McGowen, Maryjean Morn, MD  metoprolol tartrate (LOPRESSOR) 50 MG tablet Take 2 hours prior to CT scan 01/22/23   Lewayne Bunting, MD  Multiple Vitamin (MULTIVITAMIN WITH MINERALS) TABS tablet Take 1 tablet by mouth daily.    [provider]  nitroGLYCERIN (NITROSTAT) 0.4 MG SL tablet Place 1 tablet (0.4 mg total) under the tongue every 5 (five) minutes as needed for chest pain (x 3 doses). 01/08/23   Lewayne Bunting, MD  oxyCODONE-acetaminophen (PERCOCET) 10-325 MG tablet Take 1 tablet by mouth every 6 (six) hours as needed for pain. 07/02/23   Ranelle Oyster, MD  pantoprazole (PROTONIX) 40 MG tablet Take 1 tablet (40 mg total) by mouth 2 (two) times daily. 01/01/23   McGowen, Maryjean Morn, MD  predniSONE (DELTASONE) 20 MG tablet  04/07/23   [provider]  rOPINIRole (REQUIP) 0.5 MG tablet TAKE ONE TABLET BY MOUTH AT BEDTIME 01/03/23   McGowen, Maryjean Morn, MD  rosuvastatin (CRESTOR) 40 MG tablet Take 1 tablet (40 mg total) by mouth every evening. 04/02/23  Lewayne Bunting, MD  solifenacin (VESICARE) 10 MG tablet TAKE ONE TABLET BY MOUTH ONCE DAILY **NEED OFFICE VISIT** FOR FURTHER REFILLS 05/12/23   Jeoffrey Massed, MD   traZODone (DESYREL) 50 MG tablet take 2-4 tablets at bedtime as needed for insomnia. Patient taking differently: Take 100-200 mg by mouth at bedtime as needed for sleep. 12/09/22   Jeoffrey Massed, MD    Physical Exam: Vitals:   07/06/23 1630 07/06/23 1700 07/06/23 1748 07/06/23 1800  BP: (!) 159/78 (!) 162/87  (!) 170/86  Pulse: 82 82  84  Resp: (!) 22 18  16   Temp:   98 F (36.7 C)   TempSrc:   Oral   SpO2: 96% 96%  93%  Weight:      Height:        Physical Exam Vitals reviewed.  Constitutional:      General: She is not in acute distress. HENT:     Head: Normocephalic and atraumatic.  Eyes:     Extraocular Movements: Extraocular movements intact.  Cardiovascular:     Rate and Rhythm: Normal rate and regular rhythm.     Pulses: Normal pulses.  Pulmonary:     Breath sounds: No stridor. Wheezing and rhonchi present.     Comments: Tachypneic and having difficulty speaking in full sentences Abdominal:     General: Bowel sounds are normal. There is no distension.     Palpations: Abdomen is soft.     Tenderness: There is no abdominal tenderness.  Musculoskeletal:     Cervical back: Normal range of motion.     Right lower leg: No edema.     Left lower leg: No edema.  Skin:    General: Skin is warm and dry.  Neurological:     General: No focal deficit present.     Mental Status: She is alert and oriented to person, place, and time.  Psychiatric:     Comments: Appears anxious and agitated     Labs on Admission: I have personally reviewed following labs and imaging studies  CBC: Recent Labs  Lab 07/06/23 1324  WBC 7.5  NEUTROABS 5.2  HGB 10.7*  HCT 33.6*  MCV 90.1  PLT 177   Basic Metabolic Panel: Recent Labs  Lab 07/06/23 1324  NA 138  K 3.3*  CL 106  CO2 23  GLUCOSE 91  BUN 14  CREATININE 0.69  CALCIUM 8.1*   GFR: Estimated Creatinine Clearance: 55.7 mL/min (by C-G formula based on SCr of 0.69 mg/dL). Liver Function Tests: Recent Labs  Lab  07/06/23 1324  AST 16  ALT 11  ALKPHOS 65  BILITOT 0.7  PROT 6.9  ALBUMIN 3.4*   No results for input(s): "LIPASE", "AMYLASE" in the last 168 hours. No results for input(s): "AMMONIA" in the last 168 hours. Coagulation Profile: No results for input(s): "INR", "PROTIME" in the last 168 hours. Cardiac Enzymes: No results for input(s): "CKTOTAL", "CKMB", "CKMBINDEX", "TROPONINI" in the last 168 hours. BNP (last 3 results) No results for input(s): "PROBNP" in the last 8760 hours. HbA1C: No results for input(s): "HGBA1C" in the last 72 hours. CBG: No results for input(s): "GLUCAP" in the last 168 hours. Lipid Profile: No results for input(s): "CHOL", "HDL", "LDLCALC", "TRIG", "CHOLHDL", "LDLDIRECT" in the last 72 hours. Thyroid Function Tests: No results for input(s): "TSH", "T4TOTAL", "FREET4", "T3FREE", "THYROIDAB" in the last 72 hours. Anemia Panel: No results for input(s): "VITAMINB12", "FOLATE", "FERRITIN", "TIBC", "IRON", "RETICCTPCT" in the last 72 hours. Urine analysis:  Component Value Date/Time   COLORURINE AMBER (A) 02/23/2023 2057   APPEARANCEUR CLOUDY (A) 02/23/2023 2057   LABSPEC 1.012 02/23/2023 2057   PHURINE 5.0 02/23/2023 2057   GLUCOSEU NEGATIVE 02/23/2023 2057   HGBUR NEGATIVE 02/23/2023 2057   BILIRUBINUR NEGATIVE 02/23/2023 2057   BILIRUBINUR 1+ 01/01/2023 1354   KETONESUR NEGATIVE 02/23/2023 2057   PROTEINUR NEGATIVE 02/23/2023 2057   UROBILINOGEN 1.0 01/01/2023 1354   UROBILINOGEN 1.0 10/16/2015 2009   NITRITE POSITIVE (A) 02/23/2023 2057   LEUKOCYTESUR SMALL (A) 02/23/2023 2057    Radiological Exams on Admission: CT Chest W Contrast  Result Date: 07/22/23 CLINICAL DATA:  Dyspnea and shortness of breath. EXAM: CT CHEST WITH CONTRAST TECHNIQUE: Multidetector CT imaging of the chest was performed during intravenous contrast administration. RADIATION DOSE REDUCTION: This exam was performed according to the departmental dose-optimization program  which includes automated exposure control, adjustment of the mA and/or kV according to patient size and/or use of iterative reconstruction technique. CONTRAST:  75mL OMNIPAQUE IOHEXOL 300 MG/ML  SOLN COMPARISON:  Chest x-ray 2023/07/22 FINDINGS: Cardiovascular: Enlarged cardiac silhouette. Calcific atherosclerotic disease of the coronary arteries and aorta. Minimal pericardial thickening. Mediastinum/Nodes: Borderline enlarged mediastinal and hilar lymph nodes. Index precarinal lymph node measures 1 cm in short axis. 8 mm hypoattenuated lesion within the right thyroid gland. Patent main airways. Lungs/Pleura: Heavy bilateral pleural calcifications with associated linear nodular pleural and subpleural thickening. Most prominent nodular thickening is seen in the left lower lobe. Upper Abdomen: No acute abnormality. Musculoskeletal: Compression deformity of T7, T9 and T12 vertebral bodies, stable when compared to Mar 14, 2022, CT of the thoracic spine. IMPRESSION: 1. Heavy bilateral pleural calcifications with associated linear and nodular pleural and subpleural thickening. Most prominent nodular thickening is seen in the left lower lobe. Findings are consistent with asbestos related pleural disease or posttraumatic changes. Malignancy is not excluded. Further evaluation with PET-CT may be considered when clinically appropriate. 2. Borderline enlarged mediastinal and hilar lymph nodes, stable. 3. Enlarged cardiac silhouette. 4. Calcific atherosclerotic disease of the coronary arteries and aorta. 5. 8 mm hypoattenuated lesion within the right thyroid gland. Not clinically significant; no follow-up imaging recommended (ref: J Am Coll Radiol. 2015 Feb;12(2): 143-50). 6. Compression deformity of T7, T9 and T12 vertebral bodies, stable when compared to Jul 22, 2023, CT of the thoracic spine. 7. Aortic atherosclerosis. Aortic Atherosclerosis (ICD10-I70.0). Electronically Signed   By: Ted Mcalpine M.D.   On:  Jul 22, 2023 17:01   DG Chest Portable 1 View  Result Date: Jul 22, 2023 CLINICAL DATA:  Dyspnea and shortness of breath EXAM: PORTABLE CHEST 1 VIEW COMPARISON:  06/02/2023 FINDINGS: Stable mild enlargement of the cardiopericardial silhouette. Atherosclerotic calcification of the aortic arch. Stable bilateral pleural calcifications. Against this background of densities, no new or progressive opacities are identified in the lungs. There is some mild scarring of the left lung base laterally. Stable mild fullness of the hila. IMPRESSION: 1. No acute findings. 2. Stable mild enlargement of the cardiopericardial silhouette. 3. Stable extensive bilateral pleural calcifications. 4. Stable fullness of the hila, adenopathy not excluded and was shown on prior CT of 04/02/2023. 5.  Aortic Atherosclerosis (ICD10-I70.0). Electronically Signed   By: Gaylyn Rong M.D.   On: 2023/07/22 14:14    EKG: Independently reviewed.  Sinus rhythm, RBBB.  No acute changes.  Assessment and Plan  Acute on chronic hypoxemic respiratory failure Pulmonary asbestosis History of vocal cord dysfunction Patient is presenting with 6 week history of progressively worsening dyspnea, dry cough,  and wheezing.  She continues to use Breztri but ran out of her home nebulizer medication.  Also was not able to use her home oxygen for the past 24 hours due to issues with the concentrator.  When patient initially presented to the ED, she was in severe respiratory distress and was brought in by EMS on nonrebreather.  She was given Solu-Medrol 125 mg and bronchodilator treatments in the ED including hour-long albuterol neb and DuoNeb.  Currently satting in the upper 90s on 2 L [place] which is her baseline.  Overall patient has improved but continues to have tachypnea and difficulty speaking in full sentences.  She has wheezing and rhonchi on exam.  CT done today showing heavy bilateral pleural calcifications with associated linear and nodular pleural and  subpleural thickening, most prominent nodular thickening seen in the left lower lobe.  Findings consistent with asbestosis related pleural disease or posttraumatic changes; malignancy is not excluded.  Patient will need outpatient PET-CT for further evaluation. Pulmonology note from visit last month reviewed, it is felt that the patient does not have COPD but rather has vocal cord dysfunction which is contributing to her symptoms.  Pulmonology had advised minimizing use of her home sedating medications but patient continues to adamantly request her home medications for chronic pain and appears agitated and anxious.  Discussed with on-call pulmonologist Dr. Delton Coombes who reviewed the patient's CT and feels it appears similar to her prior CT.  He recommends continuing treatment with steroids and bronchodilators and addressing her anxiety and agitation.  Pulmonology will consult, appreciate recommendations.  Will continue her home oxycodone and Xanax but avoid any other sedating medications.  Continue treatment with IV Solu-Medrol, scheduled and as needed bronchodilators, antitussive as needed.  Continue supplemental oxygen and admit to stepdown unit for close monitoring.  May need BiPAP if not improving despite management of her anxiety and agitation.  Mild hypokalemia Monitor potassium and magnesium levels, continue to replace as needed.  OSA Continue supplemental oxygen.  Patient is currently not on CPAP at home and will need outpatient sleep study.  Chronic HFpEF Echo done in February 2023 showing EF 70 to 75%, grade 1 diastolic dysfunction, trivial MVR.  No signs of volume overload this time, BNP normal.  Chronic pain Continue home oxycodone.  Anxiety Continue home Xanax.  History of nonobstructive CAD EKG without acute ischemic changes and she is not endorsing chest pain.  Normocytic anemia Hemoglobin 10.7 (baseline 12-13).  No signs or symptoms of bleeding.  Continue to monitor CBC.  DVT  prophylaxis: Lovenox Code Status: DNR/DNI (discussed with the patient) Family Communication: No family available at this time. Consults called: Pulmonology Level of care: Step Down Unit Admission status: It is my clinical opinion that admission to INPATIENT is reasonable and necessary because of the expectation that this patient will require hospital care that crosses at least 2 midnights to treat this condition based on the medical complexity of the problems presented.  Given the aforementioned information, the predictability of an adverse outcome is felt to be significant.   John Giovanni MD [facility]  If 7PM-7AM, please contact night-coverage [email or web address]  07/06/2023, 6:52 PM

## 2023-07-06 NOTE — ED Provider Notes (Signed)
Results for orders placed or performed during the hospital encounter of 07/06/23  Comprehensive metabolic panel  Result Value Ref Range   Sodium 138 135 - 145 mmol/L   Potassium 3.3 (L) 3.5 - 5.1 mmol/L   Chloride 106 98 - 111 mmol/L   CO2 23 22 - 32 mmol/L   Glucose, Bld 91 70 - 99 mg/dL   BUN 14 8 - 23 mg/dL   Creatinine, Ser 1.61 0.44 - 1.00 mg/dL   Calcium 8.1 (L) 8.9 - 10.3 mg/dL   Total Protein 6.9 6.5 - 8.1 g/dL   Albumin 3.4 (L) 3.5 - 5.0 g/dL   AST 16 15 - 41 U/L   ALT 11 0 - 44 U/L   Alkaline Phosphatase 65 38 - 126 U/L   Total Bilirubin 0.7 0.3 - 1.2 mg/dL   GFR, Estimated >09 >60 mL/min   Anion gap 9 5 - 15  CBC with Differential  Result Value Ref Range   WBC 7.5 4.0 - 10.5 K/uL   RBC 3.73 (L) 3.87 - 5.11 MIL/uL   Hemoglobin 10.7 (L) 12.0 - 15.0 g/dL   HCT 45.4 (L) 09.8 - 11.9 %   MCV 90.1 80.0 - 100.0 fL   MCH 28.7 26.0 - 34.0 pg   MCHC 31.8 30.0 - 36.0 g/dL   RDW 14.7 82.9 - 56.2 %   Platelets 177 150 - 400 K/uL   nRBC 0.0 0.0 - 0.2 %   Neutrophils Relative % 69 %   Neutro Abs 5.2 1.7 - 7.7 K/uL   Lymphocytes Relative 19 %   Lymphs Abs 1.4 0.7 - 4.0 K/uL   Monocytes Relative 9 %   Monocytes Absolute 0.7 0.1 - 1.0 K/uL   Eosinophils Relative 3 %   Eosinophils Absolute 0.3 0.0 - 0.5 K/uL   Basophils Relative 0 %   Basophils Absolute 0.0 0.0 - 0.1 K/uL   Immature Granulocytes 0 %   Abs Immature Granulocytes 0.02 0.00 - 0.07 K/uL  Brain natriuretic peptide  Result Value Ref Range   B Natriuretic Peptide 83.4 0.0 - 100.0 pg/mL  Blood gas, venous (at [facility] and [facility])  Result Value Ref Range   pH, Ven 7.4 7.25 - 7.43   pCO2, Ven 43 (L) 44 - 60 mmHg   pO2, Ven 56 (H) 32 - 45 mmHg   Bicarbonate 26.6 20.0 - 28.0 mmol/L   Acid-Base Excess 1.5 0.0 - 2.0 mmol/L   O2 Saturation 92 %   Patient temperature 37.0   Troponin I (High Sensitivity)  Result Value Ref Range   Troponin I (High Sensitivity) 3 <18 ng/L  Troponin I (High Sensitivity)  Result Value Ref Range    Troponin I (High Sensitivity) 3 <18 ng/L   *Note: Due to a large number of results and/or encounters for the requested time period, some results have not been displayed. A complete set of results can be found in Results Review.   CT Chest W Contrast  Result Date: 07/06/2023 CLINICAL DATA:  Dyspnea and shortness of breath. EXAM: CT CHEST WITH CONTRAST TECHNIQUE: Multidetector CT imaging of the chest was performed during intravenous contrast administration. RADIATION DOSE REDUCTION: This exam was performed according to the departmental dose-optimization program which includes automated exposure control, adjustment of the mA and/or kV according to patient size and/or use of iterative reconstruction technique. CONTRAST:  75mL OMNIPAQUE IOHEXOL 300 MG/ML  SOLN COMPARISON:  Chest x-ray July 06, 2023 FINDINGS: Cardiovascular: Enlarged cardiac silhouette. Calcific atherosclerotic disease of the coronary arteries  and aorta. Minimal pericardial thickening. Mediastinum/Nodes: Borderline enlarged mediastinal and hilar lymph nodes. Index precarinal lymph node measures 1 cm in short axis. 8 mm hypoattenuated lesion within the right thyroid gland. Patent main airways. Lungs/Pleura: Heavy bilateral pleural calcifications with associated linear nodular pleural and subpleural thickening. Most prominent nodular thickening is seen in the left lower lobe. Upper Abdomen: No acute abnormality. Musculoskeletal: Compression deformity of T7, T9 and T12 vertebral bodies, stable when compared to 03/18/2022, CT of the thoracic spine. IMPRESSION: 1. Heavy bilateral pleural calcifications with associated linear and nodular pleural and subpleural thickening. Most prominent nodular thickening is seen in the left lower lobe. Findings are consistent with asbestos related pleural disease or posttraumatic changes. Malignancy is not excluded. Further evaluation with PET-CT may be considered when clinically appropriate. 2. Borderline enlarged  mediastinal and hilar lymph nodes, stable. 3. Enlarged cardiac silhouette. 4. Calcific atherosclerotic disease of the coronary arteries and aorta. 5. 8 mm hypoattenuated lesion within the right thyroid gland. Not clinically significant; no follow-up imaging recommended (ref: J Am Coll Radiol. 2015 Feb;12(2): 143-50). 6. Compression deformity of T7, T9 and T12 vertebral bodies, stable when compared to 07/26/23, CT of the thoracic spine. 7. Aortic atherosclerosis. Aortic Atherosclerosis (ICD10-I70.0). Electronically Signed   By: Ted Mcalpine M.D.   On: July 26, 2023 17:01   DG Chest Portable 1 View  Result Date: 07/26/2023 CLINICAL DATA:  Dyspnea and shortness of breath EXAM: PORTABLE CHEST 1 VIEW COMPARISON:  06/02/2023 FINDINGS: Stable mild enlargement of the cardiopericardial silhouette. Atherosclerotic calcification of the aortic arch. Stable bilateral pleural calcifications. Against this background of densities, no new or progressive opacities are identified in the lungs. There is some mild scarring of the left lung base laterally. Stable mild fullness of the hila. IMPRESSION: 1. No acute findings. 2. Stable mild enlargement of the cardiopericardial silhouette. 3. Stable extensive bilateral pleural calcifications. 4. Stable fullness of the hila, adenopathy not excluded and was shown on prior CT of 04/02/2023. 5.  Aortic Atherosclerosis (ICD10-I70.0). Electronically Signed   By: Gaylyn Rong M.D.   On: Jul 26, 2023 14:14    CT scan raises some concerns about possible malignancy but nothing definite.  Requesting outpatient PET scan.  However patient no longer has adequate oxygen at home contact hospitalist for admission patient on 2 L of oxygen and is satting around 95% currently.  Patient treated here with albuterol nebulizer treatments and steroids.  Patient's concentrator of oxygen at home broke.  She was without oxygen for 24 hours.  Came in and significant respiratory distress and  hypoxia.     Vanetta Mulders, MD Jul 26, 2023 1740

## 2023-07-06 NOTE — Progress Notes (Signed)
Patient requests purwick as she is incontinent constantly. Provider updated.

## 2023-07-06 NOTE — ED Triage Notes (Signed)
BIBA from [place] for [facility]. Pt home O2 went out 2 days ago, wheezing throughout. Pt arrived on NRB at 100% 124/78 BP 90 HR 20 g left forearm

## 2023-07-06 NOTE — Progress Notes (Signed)
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 Out of nebs and O2 concentrator broke also admitted to ICU for  Acute on chronic resp failure, doing better post duo neb, solu medrol and now on nasal o2. VCD-anxiety . OSA. Unlikely has new PE. If worsening , consider d dimer, CTPA.  - continue nebs, steroids. Xanax prn. Cymbalata - received K replacement.  - Pulmonary consultation.- abnormal Ct chest  Data: reviewed CT chest 7/14>> stable pleural thickening and associated calcifications/plaques most notable in the left lower lobe.  No new infiltrates or progression of pleural disease compared with cardiac CT 04/02/2023 Cr normal, K 3.3 LFT normal. Wbc normal EKG: sins, RBBB, no acute changes. Qtc 474  Camera evaluation done: Resting comfortably on nasal o2 at 3 lit. Stable Vitals.  WUJ:[id number] SQ.  CBG goals < 180. on SSI.   Code; DNR  Rever Pichette. Reatha Harps, MD, FCCP. [phone]   eICU Interventions       Intervention Category Major Interventions: Respiratory failure - evaluation and management Evaluation Type: New Patient Evaluation  [name] 07/06/2023, 11:41 PM

## 2023-07-06 NOTE — ED Provider Notes (Signed)
[facility] AT [facility] Provider Note  CSN: [id number] Arrival date & time: 07/06/23 1255  Chief Complaint(s) Shortness of Breath  HPI Linda [name] is a 77 y.o. female with PMH chronic hypoxic respiratory failure on 3 L, CHF, asbestosis, OSA who presents emergency room for evaluation of shortness of breath.  Patient states that her symptoms have dramatically worsened over the last 6 weeks and over the last 24 hours have worsened even further.  Patient states that her oxygen concentrator broke at home and she has been without it for at least 24 hours.  She arrives in severe respiratory distress with accessory muscle use and wheezing bilaterally.  Additional history unable to be obtained secondary to patient's respiratory distress.   Past Medical History Past Medical History:  Diagnosis Date   Acute upper GI bleed 06/2014   while pt taking coumadin, plavix, and meloxicam---despite being told not to take coumadin.   Anginal pain (HCC)    Nonobstructive CAD 2014; however, her cardiologist put her on a statin for this and NOT for hyperlipidemia per pt report.  Atyp CP 08/2017 at card f/u, plan for myoc perf imaging.   Anxiety    panic attacks   Asthma    w/ asbestososis    BPPV (benign paroxysmal positional vertigo) 12/16/2012   CAD (coronary artery disease)    Nonobstructive ([facility])   Chronic diastolic CHF (congestive heart failure) (HCC)    dry wt as of 11/06/16 is 168 lbs.   Chronic lower back pain    COPD (chronic obstructive pulmonary disease) (HCC)    DDD (degenerative disc disease)    lumbar and cervical.    Diverticular disease    Fibromyalgia    Patient states dx was around her late 32s but she had sx's for years prior to this.   H/O hiatal hernia    History of pneumonia    hospitalized 12/2011, 02/2013, and 07/2013 [facility]) for this   HTN (hypertension)    Renal artery dopplers 04/2013 neg for stenosis.   Hypervitaminosis D 09/27/2019    over-supplemented.  Stopped vit D and plan recheck 2 mo.   Idiopathic angio-edema-urticaria 40981; 2021   Angioedema component was very minimal.  2021->Dr. Bobbitt (allergist) eval.   Insomnia    Iron deficiency anemia    Hematologist in [place], [place] did extensive w/u; no cause found; failed oral supplement;; gets fairly regular (q6m or so) IV iron infusions (Venofer -iron sucrose- 200mg  with procrit.  "for 14 yr I've been getting blood work q month & getting infusions prn" (07/12/2013).  Dr. Myna Hidalgo locally, iron infusions done, EPO deficiency dx'd   Kidney stone    Migraine syndrome    "not as often anymore; used to be ~ q wk" (07/12/2013)   Mixed incontinence urge and stress    Nephrolithiasis    "passed all on my own or they are still in there" (07/12/2013)   Neuroleptic induced parkinsonism (HCC) 2018   Dr. Arbutus Leas, neuro, saw her 11/24/17 and recommended d/c of abilify as first step.  D/c'd abilify and pt got complete recovery.   Oropharyngeal dysphagia    swallowing study speech path 05/2020. Gastric bx's showed gastritis, h pylori NEG   OSA on CPAP    prior to move to [place]--had another sleep study 10/2015 w/pulm Dr. Su Monks.   Osteoarthritis    "severe; progressing fast" (07/12/2013); multiple joints-not surgical candidate for TKR (03/2015).  Triamcinolon knee injections by Dr. Hermelinda Medicus 12/2017.   Pernicious  anemia 08/24/2014   Pleural plaque with presence of asbestos 07/22/2013   Pulmonary embolism (HCC) 07/2013   Dx at [facility] with very small peripheral upper lobe pe 07/2013: pt took coumadin for about 8-9 mo   Pyelonephritis    "several times over the last 30 yr" (07/12/2013)   RBBB (right bundle branch block)    Recurrent major depression (HCC)    Recurrent UTI    hx of hospitalization for pyelonephritis; started abx prophylaxis 06/2015   Syncope    Hypotensive; ED visit--Dr. Sharyn Lull did Cath--nonobstructive CAD, EF 55-60%.  In retrospect, suspect pt rx med misuse/polypharmacy   Vertebral  compression fracture (HCC)    Acute T12 on 02/27/22 (fall).  multiple old thoracic-->neurosurg to do MRI as of 05/2022   Patient Active Problem List   Diagnosis Date Noted   Thoracic vertebral fracture (HCC) 02/26/2022   Fall 02/16/2022   Maxillary fracture (HCC) 02/16/2022   Right orbital fracture (HCC) 02/16/2022   Right ureteral stone 11/30/2021   Chronic pain of left knee 10/18/2021   Spondylosis of lumbar spine 08/23/2021   Acute encephalopathy 11/24/2020   Dysphagia 06/15/2020   Allergic reaction 02/01/2020   Idiopathic angioedema 01/20/2020   Dysarthria 01/20/2020   Hypotension 10/04/2019   COPD (chronic obstructive pulmonary disease) (HCC) 01/21/2018   Pulmonary asbestosis (HCC) 01/21/2018   Hypomagnesemia 01/02/2018   Failure to thrive in adult 01/01/2018   Altered mental status 10/21/2017   Lumbar radiculitis 02/10/2017   Low serum erythropoietin level 10/17/2016   RBBB 09/23/2016   Primary osteoarthritis of left knee 06/19/2016   Debilitated patient 06/06/2016   CHF (congestive heart failure) (HCC) 05/30/2016   Chronic respiratory failure with hypoxia (HCC) 02/07/2016   Restrictive lung disease 02/07/2016   Rotator cuff syndrome of right shoulder 01/10/2016   Encephalopathy, metabolic 01/01/2016   Fall at home 01/01/2016   Rhabdomyolysis 01/01/2016   Diastolic heart failure (HCC) 10/16/2015   CAD-minor 2014 08/16/2015   Chest pain with moderate risk for cardiac etiology 08/16/2015   Narrowing of intervertebral disc space 07/17/2015   Bilateral lower leg pain 01/24/2015   Damage to right ulnar nerve 01/16/2015   Chronic pain syndrome 11/21/2014   Anxiety and depression 11/21/2014   Hypokalemia 11/21/2014   Hyperlipidemia 11/21/2014   Chronic pain disorder 11/21/2014   Primary osteoarthritis of right knee 10/18/2014   Pernicious anemia 08/24/2014   Generalized anxiety disorder--with occasional panic attacks.  08/05/2014   Recurrent major depression-severe (HCC)  08/05/2014   Multifactorial gait disorder 07/26/2014   Epistaxis 07/18/2014   Acute GI bleeding 07/17/2014   Anemia associated with acute blood loss 07/17/2014   Syncope 07/17/2014   History of pulmonary embolism 07/17/2014   GI bleed 07/17/2014   Arthritis 05/11/2014   DDD (degenerative disc disease) 05/11/2014   Fibrositis 05/11/2014   Amianthosis (HCC) 05/11/2014   [name] (HCC) 05/11/2014   Grief 04/28/2014   OSA (obstructive sleep apnea) 04/24/2014   Chronic diastolic CHF, NYHA class 1 04/24/2014   Atelectasis 08/06/2013   Acute pulmonary embolism (HCC) 08/04/2013   Pleural plaque with presence of asbestos 07/22/2013   Polypharmacy 04/26/2013   Fibromyalgia syndrome 03/01/2013   Insomnia 11/12/2012   HTN (hypertension), benign 10/25/2012   Home Medication(s) Prior to Admission medications   Medication Sig Start Date End Date Taking? Authorizing Provider  albuterol (VENTOLIN HFA) 108 (90 Base) MCG/ACT inhaler Inhale 1-2 puffs into the lungs every 6 (six) hours as needed for wheezing or shortness of breath. 01/01/23   McGowen,  Maryjean Morn, MD  ALPRAZolam Prudy Feeler) 0.5 MG tablet TAKE ONE TABLET BY MOUTH THREE TIMES DAILY 02/24/23   McGowen, Maryjean Morn, MD  amLODipine (NORVASC) 5 MG tablet TAKE ONE TABLET BY MOUTH DAILY 02/24/23   McGowen, Maryjean Morn, MD  aspirin 81 MG tablet Take 81 mg by mouth at bedtime.    [provider]  benzonatate (TESSALON) 100 MG capsule Take by mouth. 06/03/23   [provider]  BREZTRI AEROSPHERE 160-9-4.8 MCG/ACT AERO Inhale into the lungs. 06/03/23   [provider]  buprenorphine (BUTRANS) 5 MCG/HR PTWK Place 1 patch onto the skin once a week. 07/02/23   Ranelle Oyster, MD  diclofenac Sodium (VOLTAREN) 1 % GEL Apply 2 g topically 4 (four) times daily. 4 times a day to both knees Patient taking differently: Apply 2 g topically 4 (four) times daily as needed (pain). 08/07/22   Ranelle Oyster, MD  DULoxetine (CYMBALTA) 60 MG capsule  Take 1 capsule (60 mg total) by mouth daily. 04/10/23   McGowen, Maryjean Morn, MD  EPINEPHrine 0.3 mg/0.3 mL IJ SOAJ injection Inject 0.3 mg into the muscle as needed for anaphylaxis. 12/28/20   McGowen, Maryjean Morn, MD  ipratropium (ATROVENT) 0.03 % nasal spray Place 2 sprays into both nostrils every 12 (twelve) hours as needed (allergies). 01/01/23   McGowen, Maryjean Morn, MD  ipratropium-albuterol (DUONEB) 0.5-2.5 (3) MG/3ML SOLN Take 3 mLs by nebulization every 4 (four) hours as needed. Patient taking differently: Take 3 mLs by nebulization every 4 (four) hours as needed (sob/wheezing). 05/30/23   Wallis Bamberg, PA-C  isosorbide mononitrate (IMDUR) 30 MG 24 hr tablet Take 2 tablets (60 mg total) by mouth in the morning and at bedtime. 04/30/23   Joylene Grapes, NP  lamoTRIgine (LAMICTAL) 200 MG tablet Take 1 tablet (200 mg total) by mouth daily. 01/01/23   McGowen, Maryjean Morn, MD  methocarbamol (ROBAXIN) 500 MG tablet Take 1 tablet (500 mg total) by mouth every 8 (eight) hours as needed for muscle spasms. 07/02/23   Ranelle Oyster, MD  metoprolol succinate (TOPROL-XL) 100 MG 24 hr tablet take ONE tab daily, take additional 1/2 tab as needed FOR FOR BLOOD PRESSURE > 160/90 Patient taking differently: Take 50-100 mg by mouth as directed. take ONE tab daily, take additional 1/2 tab as needed FOR FOR BLOOD PRESSURE > 160/90 01/01/23   McGowen, Maryjean Morn, MD  metoprolol tartrate (LOPRESSOR) 50 MG tablet Take 2 hours prior to CT scan 01/22/23   Lewayne Bunting, MD  Multiple Vitamin (MULTIVITAMIN WITH MINERALS) TABS tablet Take 1 tablet by mouth daily.    [provider]  nitroGLYCERIN (NITROSTAT) 0.4 MG SL tablet Place 1 tablet (0.4 mg total) under the tongue every 5 (five) minutes as needed for chest pain (x 3 doses). 01/08/23   Lewayne Bunting, MD  oxyCODONE-acetaminophen (PERCOCET) 10-325 MG tablet Take 1 tablet by mouth every 6 (six) hours as needed for pain. 07/02/23   Ranelle Oyster, MD  pantoprazole  (PROTONIX) 40 MG tablet Take 1 tablet (40 mg total) by mouth 2 (two) times daily. 01/01/23   McGowen, Maryjean Morn, MD  predniSONE (DELTASONE) 20 MG tablet  04/07/23   [provider]  rOPINIRole (REQUIP) 0.5 MG tablet TAKE ONE TABLET BY MOUTH AT BEDTIME 01/03/23   McGowen, Maryjean Morn, MD  rosuvastatin (CRESTOR) 40 MG tablet Take 1 tablet (40 mg total) by mouth every evening. 04/02/23   Lewayne Bunting, MD  solifenacin (VESICARE) 10 MG  tablet TAKE ONE TABLET BY MOUTH ONCE DAILY **NEED OFFICE VISIT** FOR FURTHER REFILLS 05/12/23   McGowen, Maryjean Morn, MD  traZODone (DESYREL) 50 MG tablet take 2-4 tablets at bedtime as needed for insomnia. Patient taking differently: Take 100-200 mg by mouth at bedtime as needed for sleep. 12/09/22   McGowen, Maryjean Morn, MD                                                                                                                                    Past Surgical History Past Surgical History:  Procedure Laterality Date   APPENDECTOMY  1960   AXILLARY SURGERY Left 1978   Multiple "lump" in armpit per pt   BIOPSY  06/17/2020   Gastric bx->gastritis, h pylori neg.  Procedure: BIOPSY;  Surgeon: Jeani Hawking, MD;  Location: [facility];  Service: Endoscopy;;   CARDIAC CATHETERIZATION  01/2013   nonobstructive CAD, EF 55-60%   CARDIOVASCULAR STRESS TEST  02/22/2015   Low risk myocard perf imaging; wall motion normal, normal EF   CARDIOVASCULAR STRESS TEST     09/2017 myo perf low risk   carotid duplex doppler  10/21/2017   01/2023 1-30% bilat-->rpt 1 yr   COCCYX REMOVAL  1972   CT CTA CORONARY W/CA SCORE W/CM &/OR WO/CM     04/02/23 96%'tile calcium score, nonobst CAD-->crestor increased to 40   DEXA  06/05/2017   T-score -3.1   DILATION AND CURETTAGE OF UTERUS  ? 1970's   ESOPHAGOGASTRODUODENOSCOPY N/A 07/19/2014   Gastritis found + in the setting of supratherapeutic INR, +plavix, + meloxicam.   ESOPHAGOGASTRODUODENOSCOPY (EGD) WITH PROPOFOL N/A 06/17/2020    NO stricture or other prob to explain pt's dysphagia, dilation was done anyway.  Gastric bx's-->gastritis, h pylori neg. Procedure: ESOPHAGOGASTRODUODENOSCOPY (EGD) WITH PROPOFOL;  Surgeon: Jeani Hawking, MD;  Location: [facility];  Service: Endoscopy;  Laterality: N/A;   EYE SURGERY Left 2012-2013   "injections for ~ 1 yr; don't really know what for" (07/12/2013)   HEEL SPUR SURGERY Left 2008   kidney stone removal Right    KNEE SURGERY  2005   LEFT HEART CATHETERIZATION WITH CORONARY ANGIOGRAM N/A 01/30/2013   Procedure: LEFT HEART CATHETERIZATION WITH CORONARY ANGIOGRAM;  Surgeon: Robynn Pane, MD;  Location: [facility];  Service: Cardiovascular;  Laterality: N/A;   MALONEY DILATION  06/17/2020   Procedure: Elease Hashimoto DILATION;  Surgeon: Jeani Hawking, MD;  Location: [facility];  Service: Endoscopy;;   PLANTAR FASCIA RELEASE Left 2008   SPIROMETRY  04/25/2014   In hosp for acute asthma/COPD flare: mixed obstructive and restrictive lung disease. The FEV1 is severely reduced at 45% predicted.  FEV1 signif decreased compared to prior spirometry 07/23/13.   TENDON RELEASE  1996   Right forearm and hand   TOTAL ABDOMINAL HYSTERECTOMY  1974   TRANSTHORACIC ECHOCARDIOGRAM  01/2013; 04/2014;08/2015; 09/2017   2014--NORMAL.  2015--focal basal septal hypertrophy, EF 55-60%, grade I  diast dysfxn, mild LAE.  08/2015 EF 55-60%, nl LV syst fxn, grade I DD, valves wnl. 10/21/17: EF 65-70%, grd I DD, o/w normal. 02/17/22 EF 70-75%, hyperdynamic LV fxn, grd I DD.   Family History Family History  Problem Relation Age of Onset   Arthritis Mother    Kidney disease Mother    Heart disease Father    Stroke Father    Hypertension Father    Diabetes Father    Heart attack Father    Heart attack Paternal Grandmother    Diabetes Sister        one sister   Hypertension Sister    Asthma Sister    Hypertension Brother    Asthma Brother    Asthma Daughter    Multiple sclerosis Son     Social  History Social History   Tobacco Use   Smoking status: Never   Smokeless tobacco: Never   Tobacco comments:    never used tobacco  Vaping Use   Vaping status: Never Used  Substance Use Topics   Alcohol use: No    Alcohol/week: 0.0 standard drinks of alcohol   Drug use: No   Allergies Aripiprazole, Penicillins, and Bactrim [sulfamethoxazole-trimethoprim]  Review of Systems Review of Systems  Unable to perform ROS: Severe respiratory distress  Respiratory:  Positive for cough, shortness of breath and wheezing.     Physical Exam Vital Signs  I have reviewed the triage vital signs BP (!) 155/72   Pulse 75   Temp 97.9 F (36.6 C) (Oral)   Resp 14   Ht 5\' 4"  (1.626 m)   Wt 67.6 kg   SpO2 100%   BMI 25.58 kg/m   Physical Exam Vitals and nursing note reviewed.  Constitutional:      General: She is not in acute distress.    Appearance: She is well-developed.  HENT:     Head: Normocephalic and atraumatic.  Eyes:     Conjunctiva/sclera: Conjunctivae normal.  Cardiovascular:     Rate and Rhythm: Normal rate and regular rhythm.     Heart sounds: No murmur heard. Pulmonary:     Effort: Pulmonary effort is normal. No respiratory distress.     Breath sounds: Wheezing present.  Abdominal:     Palpations: Abdomen is soft.     Tenderness: There is no abdominal tenderness.  Musculoskeletal:        General: No swelling.     Cervical back: Neck supple.  Skin:    General: Skin is warm and dry.     Capillary Refill: Capillary refill takes less than 2 seconds.  Neurological:     Mental Status: She is alert.  Psychiatric:        Mood and Affect: Mood normal.     ED Results and Treatments Labs (all labs ordered are listed, but only abnormal results are displayed) Labs Reviewed  COMPREHENSIVE METABOLIC PANEL - Abnormal; Notable for the following components:      Result Value   Potassium 3.3 (*)    Calcium 8.1 (*)    Albumin 3.4 (*)    All other components within  normal limits  CBC WITH DIFFERENTIAL/PLATELET - Abnormal; Notable for the following components:   RBC 3.73 (*)    Hemoglobin 10.7 (*)    HCT 33.6 (*)    All other components within normal limits  BLOOD GAS, VENOUS - Abnormal; Notable for the following components:   pCO2, Ven 43 (*)    pO2, Ven 56 (*)  All other components within normal limits  BRAIN NATRIURETIC PEPTIDE  TROPONIN I (HIGH SENSITIVITY)  TROPONIN I (HIGH SENSITIVITY)                                                                                                                          Radiology DG Chest Portable 1 View  Result Date: 07/06/2023 CLINICAL DATA:  Dyspnea and shortness of breath EXAM: PORTABLE CHEST 1 VIEW COMPARISON:  06/02/2023 FINDINGS: Stable mild enlargement of the cardiopericardial silhouette. Atherosclerotic calcification of the aortic arch. Stable bilateral pleural calcifications. Against this background of densities, no new or progressive opacities are identified in the lungs. There is some mild scarring of the left lung base laterally. Stable mild fullness of the hila. IMPRESSION: 1. No acute findings. 2. Stable mild enlargement of the cardiopericardial silhouette. 3. Stable extensive bilateral pleural calcifications. 4. Stable fullness of the hila, adenopathy not excluded and was shown on prior CT of 04/02/2023. 5.  Aortic Atherosclerosis (ICD10-I70.0). Electronically Signed   By: Gaylyn Rong M.D.   On: 07/06/2023 14:14    Pertinent labs & imaging results that were available during my care of the patient were reviewed by me and considered in my medical decision making (see MDM for details).  Medications Ordered in ED Medications  iohexol (OMNIPAQUE) 300 MG/ML solution 75 mL (has no administration in time range)  ipratropium-albuterol (DUONEB) 0.5-2.5 (3) MG/3ML nebulizer solution 9 mL (9 mLs Nebulization Given 07/06/23 1311)  methylPREDNISolone sodium succinate (SOLU-MEDROL) 125 mg/2 mL injection  125 mg (125 mg Intravenous Given 07/06/23 1327)  guaiFENesin (ROBITUSSIN) 100 MG/5ML liquid 10 mL (10 mLs Oral Given 07/06/23 1449)  albuterol (PROVENTIL) (2.5 MG/3ML) 0.083% nebulizer solution 2.5 mg (2.5 mg Nebulization Given 07/06/23 1449)                                                                                                                                     Procedures .Critical Care  Performed by: Glendora Score, MD Authorized by: Glendora Score, MD   Critical care provider statement:    Critical care time (minutes):  30   Critical care was necessary to treat or prevent imminent or life-threatening deterioration of the following conditions:  Respiratory failure   Critical care was time spent personally by me on the following activities:  Development of treatment plan with patient or surrogate, discussions with consultants, evaluation of patient's response  to treatment, examination of patient, ordering and review of laboratory studies, ordering and review of radiographic studies, ordering and performing treatments and interventions, pulse oximetry, re-evaluation of patient's condition and review of old charts   (including critical care time)  Medical Decision Making / ED Course   This patient presents to the ED for concern of shortness of breath, this involves an extensive number of treatment options, and is a complaint that carries with it a high risk of complications and morbidity.  The differential diagnosis includes Pe, PTX, Pulmonary Edema, ARDS, COPD/Asthma, ACS, CHF exacerbation, Arrhythmia, Pericardial Effusion/Tamponade, Anemia, Sepsis, Acidosis/Hypercapnia, Anxiety, Viral URI  MDM: Patient seen emergency room for evaluation of shortness of breath.  Physical exam reveals an ill-appearing tachypneic patient with wheezing bilaterally.  Lab evaluation largely unremarkable outside of a mild hypokalemia to 3.3, hemoglobin 10.7.  pH is normal at 7.4 with no significant  hypercarbia.  BNP and high-sensitivity troponin are normal.  Chest x-ray with stable findings consistent with her asbestosis.  Patient received multiple DuoNebs, Robitussin and methylprednisolone and on reevaluation her symptoms have significantly improved but are still quite persistent and patient does not feel close to baseline.  At time of signout, patient pending CT chest but ultimately will require admission for q4 albuterol therapies and further workup of her acute on chronic shortness of breath.  Please see provider signout for continuation of workup   Additional history obtained: -Additional history obtained from son -External records from outside source obtained and reviewed including: Chart review including previous notes, labs, imaging, consultation notes   Lab Tests: -I ordered, reviewed, and interpreted labs.   The pertinent results include:   Labs Reviewed  COMPREHENSIVE METABOLIC PANEL - Abnormal; Notable for the following components:      Result Value   Potassium 3.3 (*)    Calcium 8.1 (*)    Albumin 3.4 (*)    All other components within normal limits  CBC WITH DIFFERENTIAL/PLATELET - Abnormal; Notable for the following components:   RBC 3.73 (*)    Hemoglobin 10.7 (*)    HCT 33.6 (*)    All other components within normal limits  BLOOD GAS, VENOUS - Abnormal; Notable for the following components:   pCO2, Ven 43 (*)    pO2, Ven 56 (*)    All other components within normal limits  BRAIN NATRIURETIC PEPTIDE  TROPONIN I (HIGH SENSITIVITY)  TROPONIN I (HIGH SENSITIVITY)      EKG   EKG Interpretation Date/Time:  Sunday July 06 2023 13:07:19 EDT Ventricular Rate:  69 PR Interval:  157 QRS Duration:  156 QT Interval:  442 QTC Calculation: 474 R Axis:   21  Text Interpretation: Sinus rhythm Right bundle branch block Confirmed by Franco Duley ([id number]) on 07/06/2023 2:22:16 PM         Imaging Studies ordered: I ordered imaging studies including chest x-ray I  independently visualized and interpreted imaging. I agree with the radiologist interpretation  CT chest is pending   Medicines ordered and prescription drug management: Meds ordered this encounter  Medications   ipratropium-albuterol (DUONEB) 0.5-2.5 (3) MG/3ML nebulizer solution 9 mL   methylPREDNISolone sodium succinate (SOLU-MEDROL) 125 mg/2 mL injection 125 mg   DISCONTD: albuterol (PROVENTIL) (2.5 MG/3ML) 0.083% nebulizer solution   guaiFENesin (ROBITUSSIN) 100 MG/5ML liquid 10 mL   albuterol (PROVENTIL) (2.5 MG/3ML) 0.083% nebulizer solution 2.5 mg   iohexol (OMNIPAQUE) 300 MG/ML solution 75 mL    -I have reviewed the patients home medicines and have made  adjustments as needed  Critical interventions Multiple DuoNebs, steroids    Cardiac Monitoring: The patient was maintained on a cardiac monitor.  I personally viewed and interpreted the cardiac monitored which showed an underlying rhythm of: NSR  Social Determinants of Health:  Factors impacting patients care include: none   Reevaluation: After the interventions noted above, I reevaluated the patient and found that they have :improved  Co morbidities that complicate the patient evaluation  Past Medical History:  Diagnosis Date   Acute upper GI bleed 06/2014   while pt taking coumadin, plavix, and meloxicam---despite being told not to take coumadin.   Anginal pain (HCC)    Nonobstructive CAD 2014; however, her cardiologist put her on a statin for this and NOT for hyperlipidemia per pt report.  Atyp CP 08/2017 at card f/u, plan for myoc perf imaging.   Anxiety    panic attacks   Asthma    w/ asbestososis    BPPV (benign paroxysmal positional vertigo) 12/16/2012   CAD (coronary artery disease)    Nonobstructive ([facility])   Chronic diastolic CHF (congestive heart failure) (HCC)    dry wt as of 11/06/16 is 168 lbs.   Chronic lower back pain    COPD (chronic obstructive pulmonary disease) (HCC)    DDD  (degenerative disc disease)    lumbar and cervical.    Diverticular disease    Fibromyalgia    Patient states dx was around her late 61s but she had sx's for years prior to this.   H/O hiatal hernia    History of pneumonia    hospitalized 12/2011, 02/2013, and 07/2013 [facility]) for this   HTN (hypertension)    Renal artery dopplers 04/2013 neg for stenosis.   Hypervitaminosis D 09/27/2019   over-supplemented.  Stopped vit D and plan recheck 2 mo.   Idiopathic angio-edema-urticaria 45409; 2021   Angioedema component was very minimal.  2021->Dr. Bobbitt (allergist) eval.   Insomnia    Iron deficiency anemia    Hematologist in [place], [place] did extensive w/u; no cause found; failed oral supplement;; gets fairly regular (q89m or so) IV iron infusions (Venofer -iron sucrose- 200mg  with procrit.  "for 14 yr I've been getting blood work q month & getting infusions prn" (07/12/2013).  Dr. Myna Hidalgo locally, iron infusions done, EPO deficiency dx'd   Kidney stone    Migraine syndrome    "not as often anymore; used to be ~ q wk" (07/12/2013)   Mixed incontinence urge and stress    Nephrolithiasis    "passed all on my own or they are still in there" (07/12/2013)   Neuroleptic induced parkinsonism (HCC) 2018   Dr. Arbutus Leas, neuro, saw her 11/24/17 and recommended d/c of abilify as first step.  D/c'd abilify and pt got complete recovery.   Oropharyngeal dysphagia    swallowing study speech path 05/2020. Gastric bx's showed gastritis, h pylori NEG   OSA on CPAP    prior to move to [place]--had another sleep study 10/2015 w/pulm Dr. Su Monks.   Osteoarthritis    "severe; progressing fast" (07/12/2013); multiple joints-not surgical candidate for TKR (03/2015).  Triamcinolon knee injections by Dr. Hermelinda Medicus 12/2017.   Pernicious anemia 08/24/2014   Pleural plaque with presence of asbestos 07/22/2013   Pulmonary embolism (HCC) 07/2013   Dx at [facility] with very small peripheral upper lobe pe 07/2013: pt took coumadin for about 8-9 mo    Pyelonephritis    "several times over the last 30 yr" (07/12/2013)   RBBB (right  bundle branch block)    Recurrent major depression (HCC)    Recurrent UTI    hx of hospitalization for pyelonephritis; started abx prophylaxis 06/2015   Syncope    Hypotensive; ED visit--Dr. Sharyn Lull did Cath--nonobstructive CAD, EF 55-60%.  In retrospect, suspect pt rx med misuse/polypharmacy   Vertebral compression fracture (HCC)    Acute T12 on 02/27/22 (fall).  multiple old thoracic-->neurosurg to do MRI as of 05/2022      Dispostion: I considered admission for this patient, and disposition pending completion of imaging studies but anticipate admission     Final Clinical Impression(s) / ED Diagnoses Final diagnoses:  None     @PCDICTATION @    Glendora Score, MD 07/06/23 1553

## 2023-07-07 ENCOUNTER — Encounter (HOSPITAL_COMMUNITY): Payer: Self-pay | Admitting: Internal Medicine

## 2023-07-07 DIAGNOSIS — R058 Other specified cough: Secondary | ICD-10-CM

## 2023-07-07 DIAGNOSIS — R0603 Acute respiratory distress: Secondary | ICD-10-CM

## 2023-07-07 DIAGNOSIS — J9621 Acute and chronic respiratory failure with hypoxia: Secondary | ICD-10-CM | POA: Diagnosis not present

## 2023-07-07 DIAGNOSIS — Z789 Other specified health status: Secondary | ICD-10-CM | POA: Diagnosis not present

## 2023-07-07 DIAGNOSIS — G8929 Other chronic pain: Secondary | ICD-10-CM

## 2023-07-07 DIAGNOSIS — J383 Other diseases of vocal cords: Secondary | ICD-10-CM | POA: Diagnosis not present

## 2023-07-07 LAB — BASIC METABOLIC PANEL
Anion gap: 12 (ref 5–15)
BUN: 13 mg/dL (ref 8–23)
CO2: 23 mmol/L (ref 22–32)
Calcium: 9 mg/dL (ref 8.9–10.3)
Chloride: 103 mmol/L (ref 98–111)
Creatinine, Ser: 0.73 mg/dL (ref 0.44–1.00)
GFR, Estimated: >60 mL/min (ref >60)
Glucose, Bld: 160 mg/dL — ABNORMAL HIGH (ref 70–99)
Potassium: 3.9 mmol/L (ref 3.5–5.1)
Sodium: 138 mmol/L (ref 135–145)

## 2023-07-07 LAB — CBC
HCT: 40.2 % (ref 36.0–46.0)
Hemoglobin: 12.7 g/dL (ref 12.0–15.0)
MCH: 28.3 pg (ref 26.0–34.0)
MCHC: 31.6 g/dL (ref 30.0–36.0)
MCV: 89.7 fL (ref 80.0–100.0)
Platelets: 213 10*3/uL (ref 150–400)
RBC: 4.48 MIL/uL (ref 3.87–5.11)
RDW: 13 % (ref 11.5–15.5)
WBC: 8.9 10*3/uL (ref 4.0–10.5)
nRBC: 0 % (ref 0.0–0.2)

## 2023-07-07 LAB — MAGNESIUM: Magnesium: 2.3 mg/dL (ref 1.7–2.4)

## 2023-07-07 MED ORDER — ASPIRIN 81 MG PO TBEC
81.0000 mg | DELAYED_RELEASE_TABLET | Freq: Every day | ORAL | Status: DC
  Administered 2023-07-07 – 2023-07-20 (×14): 81 mg via ORAL
  Filled 2023-07-07 (×14): qty 1

## 2023-07-07 MED ORDER — PANTOPRAZOLE SODIUM 40 MG PO TBEC: 40.0000 mg | DELAYED_RELEASE_TABLET | Freq: Two times a day (BID) | ORAL | Status: DC

## 2023-07-07 MED ORDER — BUPRENORPHINE 5 MCG/HR TD PTWK: 1.0000 | MEDICATED_PATCH | TRANSDERMAL | Status: DC

## 2023-07-07 MED ORDER — LORATADINE 10 MG PO TABS: 10.0000 mg | ORAL_TABLET | Freq: Every day | ORAL | Status: DC

## 2023-07-07 MED ORDER — FAMOTIDINE 20 MG PO TABS
20.0000 mg | ORAL_TABLET | Freq: Every day | ORAL | Status: DC
  Administered 2023-07-07 – 2023-07-20 (×14): 20 mg via ORAL
  Filled 2023-07-07 (×14): qty 1

## 2023-07-07 MED ORDER — ROSUVASTATIN CALCIUM 20 MG PO TABS
40.0000 mg | ORAL_TABLET | Freq: Every evening | ORAL | Status: DC
  Administered 2023-07-07 – 2023-07-20 (×14): 40 mg via ORAL
  Filled 2023-07-07 (×14): qty 2

## 2023-07-07 MED ORDER — LAMOTRIGINE 100 MG PO TABS
200.0000 mg | ORAL_TABLET | Freq: Every day | ORAL | Status: DC
  Administered 2023-07-08 – 2023-07-21 (×14): 200 mg via ORAL
  Filled 2023-07-07 (×14): qty 2

## 2023-07-07 MED ORDER — AMLODIPINE BESYLATE 10 MG PO TABS
5.0000 mg | ORAL_TABLET | Freq: Every day | ORAL | Status: DC
  Administered 2023-07-07 – 2023-07-21 (×15): 5 mg via ORAL
  Filled 2023-07-07 (×15): qty 1

## 2023-07-07 MED ORDER — PANTOPRAZOLE SODIUM 40 MG PO TBEC
40.0000 mg | DELAYED_RELEASE_TABLET | Freq: Two times a day (BID) | ORAL | Status: DC
  Administered 2023-07-07 – 2023-07-21 (×29): 40 mg via ORAL
  Filled 2023-07-07 (×29): qty 1

## 2023-07-07 MED ORDER — NYSTATIN 100000 UNIT/ML MT SUSP
5.0000 mL | Freq: Four times a day (QID) | OROMUCOSAL | Status: AC
  Administered 2023-07-07 – 2023-07-11 (×19): 500000 [IU] via ORAL
  Filled 2023-07-07 (×19): qty 5

## 2023-07-07 MED ORDER — AZELASTINE HCL 0.1 % NA SOLN
1.0000 | Freq: Every morning | NASAL | Status: DC
  Administered 2023-07-08 – 2023-07-21 (×13): 1 via NASAL
  Filled 2023-07-07: qty 30

## 2023-07-07 MED ORDER — ONDANSETRON HCL 4 MG/2ML IJ SOLN
4.0000 mg | Freq: Four times a day (QID) | INTRAMUSCULAR | Status: DC | PRN
  Administered 2023-07-07 – 2023-07-18 (×11): 4 mg via INTRAVENOUS
  Filled 2023-07-07 (×12): qty 2

## 2023-07-07 MED ORDER — ADULT MULTIVITAMIN W/MINERALS CH
1.0000 | ORAL_TABLET | Freq: Every day | ORAL | Status: DC
  Administered 2023-07-07 – 2023-07-21 (×15): 1 via ORAL
  Filled 2023-07-07 (×15): qty 1

## 2023-07-07 MED ORDER — ISOSORBIDE MONONITRATE ER 60 MG PO TB24
60.0000 mg | ORAL_TABLET | Freq: Two times a day (BID) | ORAL | Status: DC
  Administered 2023-07-07 – 2023-07-21 (×29): 60 mg via ORAL
  Filled 2023-07-07 (×29): qty 1

## 2023-07-07 MED ORDER — METHOCARBAMOL 500 MG PO TABS
500.0000 mg | ORAL_TABLET | Freq: Three times a day (TID) | ORAL | Status: DC | PRN
  Administered 2023-07-12 – 2023-07-17 (×7): 500 mg via ORAL
  Filled 2023-07-07 (×7): qty 1

## 2023-07-07 MED ORDER — METOPROLOL SUCCINATE ER 100 MG PO TB24
100.0000 mg | ORAL_TABLET | Freq: Every day | ORAL | Status: DC
  Administered 2023-07-07 – 2023-07-21 (×15): 100 mg via ORAL
  Filled 2023-07-07 (×4): qty 4
  Filled 2023-07-07 (×2): qty 1
  Filled 2023-07-07: qty 4
  Filled 2023-07-07: qty 1
  Filled 2023-07-07 (×4): qty 4
  Filled 2023-07-07 (×3): qty 1

## 2023-07-07 MED ORDER — PANTOPRAZOLE SODIUM 40 MG PO TBEC
40.0000 mg | DELAYED_RELEASE_TABLET | Freq: Every day | ORAL | Status: DC
Start: 2023-07-07 — End: 2023-07-07

## 2023-07-07 MED ORDER — AZITHROMYCIN 250 MG PO TABS
500.0000 mg | ORAL_TABLET | Freq: Every day | ORAL | Status: AC
  Administered 2023-07-07 – 2023-07-11 (×5): 500 mg via ORAL
  Filled 2023-07-07 (×5): qty 2

## 2023-07-07 MED ORDER — SALINE SPRAY 0.65 % NA SOLN
2.0000 | Freq: Two times a day (BID) | NASAL | Status: DC
  Administered 2023-07-07 – 2023-07-20 (×18): 2 via NASAL
  Filled 2023-07-07: qty 44

## 2023-07-07 MED ORDER — HYDRALAZINE HCL 25 MG PO TABS
25.0000 mg | ORAL_TABLET | Freq: Four times a day (QID) | ORAL | Status: DC | PRN
  Administered 2023-07-10 – 2023-07-13 (×2): 25 mg via ORAL
  Filled 2023-07-07 (×2): qty 1

## 2023-07-07 MED ORDER — FLUTICASONE PROPIONATE 50 MCG/ACT NA SUSP
1.0000 | Freq: Every day | NASAL | Status: DC
  Administered 2023-07-07 – 2023-07-20 (×9): 1 via NASAL
  Filled 2023-07-07: qty 16

## 2023-07-07 MED ORDER — HYDROCOD POLI-CHLORPHE POLI ER 10-8 MG/5ML PO SUER
5.0000 mL | Freq: Every day | ORAL | Status: AC
  Administered 2023-07-07 – 2023-07-09 (×3): 5 mL via ORAL
  Filled 2023-07-07 (×3): qty 5

## 2023-07-07 MED ORDER — ROPINIROLE HCL 1 MG PO TABS
0.5000 mg | ORAL_TABLET | Freq: Every day | ORAL | Status: AC
  Administered 2023-07-07 (×2): 0.5 mg via ORAL
  Filled 2023-07-07 (×2): qty 1

## 2023-07-07 NOTE — Progress Notes (Signed)
NAME:  Linda [name], MRN:  [id number], DOB:  Oct 06, 1946, LOS: 1 ADMISSION DATE:  07/06/2023, CONSULTATION DATE: 07/06/2023 REFERRING MD: Dr. Loney Loh, CHIEF COMPLAINT: Respiratory distress  History of Present Illness:  77 year old woman with pleural plaques presumed due to asbestos exposure without interstitial lung disease, OSA not on CPAP, hypertension with chronic HFpEF, CAD, history of pulmonary embolism no longer on anticoagulation, anxiety/depression, and vocal cord dysfunction.  She has been seen remotely in our office by Dr. Marchelle Gearing, more recently by Dr. Su Monks at [facility].  Recently started by him on Breztri although unclear whether she has true COPD.  Chronic hypoxemic respiratory failure on 3 L/min at baseline She states that she has been having progressive dyspnea and increased work of breathing for about 6 weeks, unclear precipitant..  She responded temporarily to a short course of prednisone about 1 month ago.  She apparently ran out of her home nebulized medication and reports that her oxygen concentrator broke 24 hours to presentation, precipitated progressive respiratory distress, work of breathing.  She presented to [facility] ED 7/14 with progressive hypoxemia, tachypnea, respiratory distress.  She was placed on 1.00 NRB, received extended DuoNeb, Solu-Medrol.  She has improved clinically, now back down to 3 L/min but remains tachypneic with a gasping respiratory pattern especially when she speaks or coughs.  Pertinent  Medical History   Past Medical History:  Diagnosis Date   Acute upper GI bleed 06/2014   while pt taking coumadin, plavix, and meloxicam---despite being told not to take coumadin.   Anginal pain (HCC)    Nonobstructive CAD 2014; however, her cardiologist put her on a statin for this and NOT for hyperlipidemia per pt report.  Atyp CP 08/2017 at card f/u, plan for myoc perf imaging.   Anxiety    panic attacks   Asthma    w/ asbestososis    BPPV  (benign paroxysmal positional vertigo) 12/16/2012   CAD (coronary artery disease)    Nonobstructive ([place] [facility])   Chronic diastolic CHF (congestive heart failure) (HCC)    dry wt as of 11/06/16 is 168 lbs.   Chronic lower back pain    COPD (chronic obstructive pulmonary disease) (HCC)    DDD (degenerative disc disease)    lumbar and cervical.    Diverticular disease    Fibromyalgia    Patient states dx was around her late 34s but she had sx's for years prior to this.   H/O hiatal hernia    History of pneumonia    hospitalized 12/2011, 02/2013, and 07/2013 [facility]) for this   HTN (hypertension)    Renal artery dopplers 04/2013 neg for stenosis.   Hypervitaminosis D 09/27/2019   over-supplemented.  Stopped vit D and plan recheck 2 mo.   Idiopathic angio-edema-urticaria 40981; 2021   Angioedema component was very minimal.  2021->Dr. Bobbitt (allergist) eval.   Insomnia    Iron deficiency anemia    Hematologist in [place], [place] did extensive w/u; no cause found; failed oral supplement;; gets fairly regular (q52m or so) IV iron infusions (Venofer -iron sucrose- 200mg  with procrit.  "for 14 yr I've been getting blood work q month & getting infusions prn" (07/12/2013).  Dr. Myna Hidalgo locally, iron infusions done, EPO deficiency dx'd   Kidney stone    Migraine syndrome    "not as often anymore; used to be ~ q wk" (07/12/2013)   Mixed incontinence urge and stress    Nephrolithiasis    "passed all on my own or they are  still in there" (07/12/2013)   Neuroleptic induced parkinsonism (HCC) 2018   Dr. Arbutus Leas, neuro, saw her 11/24/17 and recommended d/c of abilify as first step.  D/c'd abilify and pt got complete recovery.   Oropharyngeal dysphagia    swallowing study speech path 05/2020. Gastric bx's showed gastritis, h pylori NEG   OSA on CPAP    prior to move to [place]--had another sleep study 10/2015 w/pulm Dr. Su Monks.   Osteoarthritis    "severe; progressing fast" (07/12/2013); multiple joints-not surgical  candidate for TKR (03/2015).  Triamcinolon knee injections by Dr. Hermelinda Medicus 12/2017.   Pernicious anemia 08/24/2014   Pleural plaque with presence of asbestos 07/22/2013   Pulmonary embolism (HCC) 07/2013   Dx at [facility] with very small peripheral upper lobe pe 07/2013: pt took coumadin for about 8-9 mo   Pyelonephritis    "several times over the last 30 yr" (07/12/2013)   RBBB (right bundle branch block)    Recurrent major depression (HCC)    Recurrent UTI    hx of hospitalization for pyelonephritis; started abx prophylaxis 06/2015   Syncope    Hypotensive; ED visit--Dr. Sharyn Lull did Cath--nonobstructive CAD, EF 55-60%.  In retrospect, suspect pt rx med misuse/polypharmacy   Vertebral compression fracture (HCC)    Acute T12 on 02/27/22 (fall).  multiple old thoracic-->neurosurg to do MRI as of 05/2022   Significant Hospital Events: Including procedures, antibiotic start and stop dates in addition to other pertinent events   CT chest 7/14>> stable pleural thickening and associated calcifications/plaques most notable in the left lower lobe.  No new infiltrates or progression of pleural disease compared with cardiac CT 04/02/2023  Interim History / Subjective:  Remains on 3L Feels breathing is the about the same, still increased WOB/ sob at times, some congestion she can't get up, worried about getting her home anxiety/ depression/ and HTN meds, usually takes them earlier in the morning around 6  Objective   Blood pressure (!) 137/102, pulse 80, temperature 98 F (36.7 C), temperature source Oral, resp. rate (!) 23, height 5\' 4"  (1.626 m), weight 67.5 kg, SpO2 96%.        Intake/Output Summary (Last 24 hours) at 07/07/2023 0908 Last data filed at 07/07/2023 1324 Gross per 24 hour  Intake --  Output 400 ml  Net -400 ml   Filed Weights   07/06/23 1500 07/06/23 2145  Weight: 67.6 kg 67.5 kg    Examination: General:  Chronically ill, thin, and anxious elderly female sitting in bed in mild  distress, very talkative despite attempts to focus on breathing techniques  HEENT: MM pink/moist, variable upper airway noises, prolonged exp wheeze at times, possibly transmitted from upper airway, some audible congestion, weak cough Neuro:  Alert, oriented, MAE CV: rr PULM:  mildy tachypnea/ WOB, intermittent audible wheeze, some scattered rhonchi/ rales on left GI: soft, bs+, purwick Extremities: warm/dry, no LE edema  Skin: no rashes  Afebrile, labs reviewed  Resolved Hospital Problem list    Assessment & Plan:   Vocal dysfunction with acute flare, unclear precipitant but with an apparent underlying anxiety component - remains on her baseline home O2 - cont solumedrol taper, hold changing to oral today  - consider CPAP/ BiPAP for increased WOB if other calming techniques do not help but this may worsen her anxiety.  Anxiety likely a large contributor to her WOB  - cont to encourage distraction efforts, pursed lip breathing, vocal exercises including humming as needed  - agree in continuing her home xanax,  lamictal, cymbalta, trazodone and chronic pain meds.  Consider trial dose of hydroxyzine if needed.   - add claritin, PPI, IS, flutter.  cont robitussin, OOB as tolerated  Pleural plaques consistent with asbestos lung disease.  CT chest stable - No evidence of ILD, pneumonitis consistent with asbestosis flare, reassured her about this -Supportive care -Repeat imaging/ f/u output, likely repeat CT in July 2025 to ensure plaques are stable  OSA, not currently on CPAP -consider CPAP/ BiPAP if worsening respiratory distress - needs dedicated outpatient sleep study and possible restart CPAP as per Dr Fransisca Connors plans  History of pulmonary embolism - No longer on anticoagulation - Low suspicion for recurrence, baseline O2  Chronic hypoxemic respiratory failure -remains on baseline 3 L/min  Thyroid nodule - 84m right thyroid nodule noted on CT.  Will need further output  f/u  Remainder per primary team.  Pulmonary will continue to follow.   Labs   CBC: Recent Labs  Lab 07/06/23 1324 07/07/23 0301  WBC 7.5 8.9  NEUTROABS 5.2  --   HGB 10.7* 12.7  HCT 33.6* 40.2  MCV 90.1 89.7  PLT 177 213    Basic Metabolic Panel: Recent Labs  Lab 07/06/23 1324 07/07/23 0301  NA 138 138  K 3.3* 3.9  CL 106 103  CO2 23 23  GLUCOSE 91 160*  BUN 14 13  CREATININE 0.69 0.73  CALCIUM 8.1* 9.0  MG  --  2.3   GFR: Estimated Creatinine Clearance: 55.6 mL/min (by C-G formula based on SCr of 0.73 mg/dL). Recent Labs  Lab 07/06/23 1324 07/07/23 0301  WBC 7.5 8.9    Liver Function Tests: Recent Labs  Lab 07/06/23 1324  AST 16  ALT 11  ALKPHOS 65  BILITOT 0.7  PROT 6.9  ALBUMIN 3.4*   No results for input(s): "LIPASE", "AMYLASE" in the last 168 hours. No results for input(s): "AMMONIA" in the last 168 hours.  ABG    Component Value Date/Time   PHART 7.430 07/20/2013 2000   PCO2ART 41.0 07/20/2013 2000   PO2ART 57.4 (L) 07/20/2013 2000   HCO3 26.6 07/06/2023 1324   TCO2 30 02/27/2022 0156   ACIDBASEDEF 1.1 07/13/2013 1435   O2SAT 92 07/06/2023 1324     Coagulation Profile: No results for input(s): "INR", "PROTIME" in the last 168 hours.  Cardiac Enzymes: No results for input(s): "CKTOTAL", "CKMB", "CKMBINDEX", "TROPONINI" in the last 168 hours.  HbA1C: Hgb A1c MFr Bld  Date/Time Value Ref Range Status  02/27/2022 04:42 AM 5.3 4.8 - 5.6 % Final    Comment:    (NOTE) Pre diabetes:          5.7%-6.4%  Diabetes:              >6.4%  Glycemic control for   <7.0% adults with diabetes   01/02/2016 06:10 AM 5.6 4.8 - 5.6 % Final    Comment:    (NOTE)         Pre-diabetes: 5.7 - 6.4         Diabetes: >6.4         Glycemic control for adults with diabetes: <7.0     CBG: No results for input(s): "GLUCAP" in the last 168 hours.  Critical care time: NA       Posey Boyer, MSN, NP, AG-ACNP-BC  Pulmonary & Critical  Care 07/07/2023, 9:08 AM  See Amion for pager If no response to pager , please call [phone] until 7pm After 7:00 pm call Elink  [phone]   

## 2023-07-07 NOTE — Progress Notes (Signed)
PROGRESS NOTE    Linda [name]  ZOX:[id number] DOB: January 07, 1946 DOA: 07/06/2023 PCP: Jeoffrey Massed, MD   Brief Narrative:   77 y.o. female with medical history significant of pulmonary asbestosis, chronic hypoxemic respiratory failure on 3 L home oxygen, vocal cord dysfunction, OSA, chronic HFpEF, history of PE, chronic pain, anxiety/depression, nonobstructive CAD, hypertension, idiopathic angioedema-urticaria, neuroleptic induced parkinsonism presented with worsening shortness of breath.  On presentation, she was in severe respiratory distress requiring nonrebreather.  CT chest with contrast showed findings consistent with asbestos related pleural disease or posttraumatic changes.  She was started on IV steroids.  Pulmonary was consulted.  Assessment & Plan:   Acute on chronic hypoxic respiratory failure Vocal cord dysfunction with acute flare Pleural plaques consistent with asbestos lung disease -Normally wears 3 L oxygen via nasal cannula.  Presented in severe respiratory distress requiring nonrebreather.  Respiratory status is improving.  Currently on 3 L oxygen via nasal cannula.  CT chest as above. -Continue Solu-Medrol and current nebs.  Pulmonary following.  Follow recommendations. -Continue anxiolytics and pain meds  OSA -Not on CPAP at home.  Will need outpatient sleep study  Chronic diastolic heart failure Hypertension Hyperlipidemia History of nonobstructive CAD -Compensated.  Echo in February 2023 showed EF of 70 to 75% with grade 1 diastolic dysfunction.  Strict input and output.  Daily weights.  Outpatient follow-up with cardiology.  Continue Imdur, metoprolol succinate -Continue statin -No chest pain reported  Chronic pain Anxiety/depression -Continue current regimen.  Outpatient follow-up with PCP/pain management  Hypokalemia -Resolved   DVT prophylaxis: Lovenox Code Status: DNR Family Communication: Pulmonary Disposition Plan: Status is:  Inpatient Remains inpatient appropriate because: Of severity of illness    Consultants: Pulmonary  Procedures: None  Antimicrobials: Zithromax from 07/07/2023 onwards   Subjective: Patient seen and examined at bedside.  Feels slightly better.  Complains of intermittent nausea and intermittent pain.  No fever, chest pain, vomiting reported.  Objective: Vitals:   07/07/23 0900 07/07/23 0915 07/07/23 1000 07/07/23 1004  BP:  (!) 161/122  (!) 149/110  Pulse: 85 82 78 79  Resp: 19 16 12  (!) 24  Temp:      TempSrc:      SpO2: 91% 96% 93% 95%  Weight:      Height:        Intake/Output Summary (Last 24 hours) at 07/07/2023 1035 Last data filed at 07/07/2023 0812 Gross per 24 hour  Intake --  Output 400 ml  Net -400 ml   Filed Weights   07/06/23 1500 07/06/23 2145  Weight: 67.6 kg 67.5 kg    Examination:  General exam: Appears calm and comfortable.  Looks chronically ill and deconditioned.  Currently on 3 L oxygen via nasal cannula. Respiratory system: Bilateral decreased breath sounds at bases with scattered crackles and some wheezing and tachypnea Cardiovascular system: S1 & S2 heard, Rate controlled Gastrointestinal system: Abdomen is nondistended, soft and nontender. Normal bowel sounds heard. Extremities: No cyanosis, clubbing, edema  Central nervous system: Alert and oriented. No focal neurological deficits. Moving extremities Skin: No rashes, lesions or ulcers Psychiatry: Looks anxious intermittently.  Not agitated.    Data Reviewed: I have personally reviewed following labs and imaging studies  CBC: Recent Labs  Lab 07/06/23 1324 07/07/23 0301  WBC 7.5 8.9  NEUTROABS 5.2  --   HGB 10.7* 12.7  HCT 33.6* 40.2  MCV 90.1 89.7  PLT 177 213   Basic Metabolic Panel: Recent Labs  Lab 07/06/23 1324 07/07/23 0301  NA 138 138  K 3.3* 3.9  CL 106 103  CO2 23 23  GLUCOSE 91 160*  BUN 14 13  CREATININE 0.69 0.73  CALCIUM 8.1* 9.0  MG  --  2.3    GFR: Estimated Creatinine Clearance: 55.6 mL/min (by C-G formula based on SCr of 0.73 mg/dL). Liver Function Tests: Recent Labs  Lab 07/06/23 1324  AST 16  ALT 11  ALKPHOS 65  BILITOT 0.7  PROT 6.9  ALBUMIN 3.4*   No results for input(s): "LIPASE", "AMYLASE" in the last 168 hours. No results for input(s): "AMMONIA" in the last 168 hours. Coagulation Profile: No results for input(s): "INR", "PROTIME" in the last 168 hours. Cardiac Enzymes: No results for input(s): "CKTOTAL", "CKMB", "CKMBINDEX", "TROPONINI" in the last 168 hours. BNP (last 3 results) No results for input(s): "PROBNP" in the last 8760 hours. HbA1C: No results for input(s): "HGBA1C" in the last 72 hours. CBG: No results for input(s): "GLUCAP" in the last 168 hours. Lipid Profile: No results for input(s): "CHOL", "HDL", "LDLCALC", "TRIG", "CHOLHDL", "LDLDIRECT" in the last 72 hours. Thyroid Function Tests: No results for input(s): "TSH", "T4TOTAL", "FREET4", "T3FREE", "THYROIDAB" in the last 72 hours. Anemia Panel: No results for input(s): "VITAMINB12", "FOLATE", "FERRITIN", "TIBC", "IRON", "RETICCTPCT" in the last 72 hours. Sepsis Labs: No results for input(s): "PROCALCITON", "LATICACIDVEN" in the last 168 hours.  Recent Results (from the past 240 hour(s))  MRSA Next Gen by PCR, Nasal     Status: None   Collection Time: 07/06/23  9:48 PM   Specimen: Nasal Mucosa; Nasal Swab  Result Value Ref Range Status   MRSA by PCR Next Gen NOT DETECTED NOT DETECTED Final    Comment: (NOTE) The GeneXpert MRSA Assay (FDA approved for NASAL specimens only), is one component of a comprehensive MRSA colonization surveillance program. It is not intended to diagnose MRSA infection nor to guide or monitor treatment for MRSA infections. Test performance is not FDA approved in patients less than 48 years old. Performed at [facility], [address] [place], [place] [address]          Radiology  Studies: CT Chest W Contrast  Result Date: 07/06/2023 CLINICAL DATA:  Dyspnea and shortness of breath. EXAM: CT CHEST WITH CONTRAST TECHNIQUE: Multidetector CT imaging of the chest was performed during intravenous contrast administration. RADIATION DOSE REDUCTION: This exam was performed according to the departmental dose-optimization program which includes automated exposure control, adjustment of the mA and/or kV according to patient size and/or use of iterative reconstruction technique. CONTRAST:  75mL OMNIPAQUE IOHEXOL 300 MG/ML  SOLN COMPARISON:  Chest x-ray July 06, 2023 FINDINGS: Cardiovascular: Enlarged cardiac silhouette. Calcific atherosclerotic disease of the coronary arteries and aorta. Minimal pericardial thickening. Mediastinum/Nodes: Borderline enlarged mediastinal and hilar lymph nodes. Index precarinal lymph node measures 1 cm in short axis. 8 mm hypoattenuated lesion within the right thyroid gland. Patent main airways. Lungs/Pleura: Heavy bilateral pleural calcifications with associated linear nodular pleural and subpleural thickening. Most prominent nodular thickening is seen in the left lower lobe. Upper Abdomen: No acute abnormality. Musculoskeletal: Compression deformity of T7, T9 and T12 vertebral bodies, stable when compared to 03-18-2022, CT of the thoracic spine. IMPRESSION: 1. Heavy bilateral pleural calcifications with associated linear and nodular pleural and subpleural thickening. Most prominent nodular thickening is seen in the left lower lobe. Findings are consistent with asbestos related pleural disease or posttraumatic changes. Malignancy is not excluded. Further evaluation with PET-CT may be considered when clinically appropriate.  2. Borderline enlarged mediastinal and hilar lymph nodes, stable. 3. Enlarged cardiac silhouette. 4. Calcific atherosclerotic disease of the coronary arteries and aorta. 5. 8 mm hypoattenuated lesion within the right thyroid gland. Not clinically  significant; no follow-up imaging recommended (ref: J Am Coll Radiol. 2015 Feb;12(2): 143-50). 6. Compression deformity of T7, T9 and T12 vertebral bodies, stable when compared to 2023/08/03, CT of the thoracic spine. 7. Aortic atherosclerosis. Aortic Atherosclerosis (ICD10-I70.0). Electronically Signed   By: Ted Mcalpine M.D.   On: 08-03-23 17:01   DG Chest Portable 1 View  Result Date: 2023-08-03 CLINICAL DATA:  Dyspnea and shortness of breath EXAM: PORTABLE CHEST 1 VIEW COMPARISON:  06/02/2023 FINDINGS: Stable mild enlargement of the cardiopericardial silhouette. Atherosclerotic calcification of the aortic arch. Stable bilateral pleural calcifications. Against this background of densities, no new or progressive opacities are identified in the lungs. There is some mild scarring of the left lung base laterally. Stable mild fullness of the hila. IMPRESSION: 1. No acute findings. 2. Stable mild enlargement of the cardiopericardial silhouette. 3. Stable extensive bilateral pleural calcifications. 4. Stable fullness of the hila, adenopathy not excluded and was shown on prior CT of 04/02/2023. 5.  Aortic Atherosclerosis (ICD10-I70.0). Electronically Signed   By: Gaylyn Rong M.D.   On: 03-Aug-2023 14:14        Scheduled Meds:  ALPRAZolam  0.5 mg Oral TID   amLODipine  5 mg Oral Daily   aspirin  81 mg Oral QHS   azelastine  1 spray Each Nare q morning   azithromycin  500 mg Oral Daily   buprenorphine  1 patch Transdermal Weekly   Chlorhexidine Gluconate Cloth  6 each Topical Daily   chlorpheniramine-HYDROcodone  5 mL Oral QHS   DULoxetine  30 mg Oral Daily   enoxaparin (LOVENOX) injection  40 mg Subcutaneous Q24H   famotidine  20 mg Oral QHS   fluticasone  1 spray Each Nare QHS   ipratropium-albuterol  3 mL Nebulization Q6H   isosorbide mononitrate  60 mg Oral BID   [START ON 07/08/2023] lamoTRIgine  200 mg Oral Daily   methylPREDNISolone sodium succinate  60 mg Intravenous Q24H    metoprolol succinate  100 mg Oral Daily   multivitamin with minerals  1 tablet Oral Daily   nystatin  5 mL Oral QID   mouth rinse  15 mL Mouth Rinse 4 times per day   pantoprazole  40 mg Oral BID AC   rOPINIRole  0.5 mg Oral QHS   rosuvastatin  40 mg Oral QPM   sodium chloride  2 spray Each Nare BID   traZODone  100-200 mg Oral QHS   Continuous Infusions:        Glade Lloyd, MD [facility] 07/07/2023, 10:35 AM

## 2023-07-07 NOTE — Progress Notes (Signed)
 Flutter valve given to pt. Pt knows and understands how to use. 

## 2023-07-07 NOTE — TOC Progression Note (Signed)
Transition of Care [facility]) - Progression Note    Patient Details  Name: Linda [name] MRN: [id number] Date of Birth: 1946/04/03  [facility] CM/SW Contact  Geni Bers, RN Phone Number: 07/07/2023, 12:55 PM  Clinical Narrative:     Pt is from IDL at [facility]. TOC will follow for discharge needs.  Expected Discharge Plan: Home w Home Health Services Barriers to Discharge: No Barriers Identified  Expected Discharge Plan and Services       Living arrangements for the past 2 months: Independent Living Facility                                       Social Determinants of Health (SDOH) Interventions SDOH Screenings   Food Insecurity: No Food Insecurity (07/07/2023)  Housing: Low Risk  (07/07/2023)  Transportation Needs: No Transportation Needs (07/07/2023)  Utilities: Not At Risk (07/07/2023)  Alcohol Screen: Low Risk  (01/01/2023)  Depression (PHQ2-9): Low Risk  (07/02/2023)  Recent Concern: Depression (PHQ2-9) - High Risk (05/01/2023)  Financial Resource Strain: Low Risk  (01/01/2023)  Physical Activity: Insufficiently Active (01/01/2023)  Social Connections: Moderately Isolated (01/01/2023)  Stress: No Stress Concern Present (01/01/2023)  Tobacco Use: Low Risk  (07/06/2023)  Recent Concern: Tobacco Use - Medium Risk (06/03/2023)   [facility]    Readmission Risk Interventions     No data to display

## 2023-07-08 DIAGNOSIS — R052 Subacute cough: Secondary | ICD-10-CM

## 2023-07-08 DIAGNOSIS — J9621 Acute and chronic respiratory failure with hypoxia: Secondary | ICD-10-CM | POA: Diagnosis not present

## 2023-07-08 DIAGNOSIS — J383 Other diseases of vocal cords: Secondary | ICD-10-CM | POA: Diagnosis not present

## 2023-07-08 MED ORDER — BUDESONIDE 0.25 MG/2ML IN SUSP
0.2500 mg | Freq: Two times a day (BID) | RESPIRATORY_TRACT | Status: DC
  Administered 2023-07-08 – 2023-07-21 (×27): 0.25 mg via RESPIRATORY_TRACT
  Filled 2023-07-08 (×27): qty 2

## 2023-07-08 MED ORDER — REVEFENACIN 175 MCG/3ML IN SOLN
175.0000 ug | Freq: Every day | RESPIRATORY_TRACT | Status: DC
  Administered 2023-07-08 – 2023-07-21 (×14): 175 ug via RESPIRATORY_TRACT
  Filled 2023-07-08 (×15): qty 3

## 2023-07-08 MED ORDER — METHYLPREDNISOLONE SODIUM SUCC 40 MG IJ SOLR
40.0000 mg | INTRAMUSCULAR | Status: DC
  Administered 2023-07-09 – 2023-07-12 (×4): 40 mg via INTRAVENOUS
  Filled 2023-07-08 (×4): qty 1

## 2023-07-08 MED ORDER — ARFORMOTEROL TARTRATE 15 MCG/2ML IN NEBU
15.0000 ug | INHALATION_SOLUTION | Freq: Two times a day (BID) | RESPIRATORY_TRACT | Status: DC
  Administered 2023-07-08 – 2023-07-21 (×27): 15 ug via RESPIRATORY_TRACT
  Filled 2023-07-08 (×27): qty 2

## 2023-07-08 NOTE — Progress Notes (Addendum)
PROGRESS NOTE    GEETIKA LABORDE  WUJ:[id number] DOB: December 23, 1946 DOA: 07/06/2023 PCP: Jeoffrey Massed, MD   Brief Narrative:   77 y.o. female with medical history significant of pulmonary asbestosis, chronic hypoxemic respiratory failure on 3 L home oxygen, vocal cord dysfunction, OSA, chronic HFpEF, history of PE, chronic pain, anxiety/depression, nonobstructive CAD, hypertension, idiopathic angioedema-urticaria, neuroleptic induced parkinsonism presented with worsening shortness of breath.  On presentation, she was in severe respiratory distress requiring nonrebreather.  CT chest with contrast showed findings consistent with asbestos related pleural disease or posttraumatic changes.  She was started on IV steroids.  Pulmonary was consulted.  Assessment & Plan:   Acute on chronic hypoxic respiratory failure Vocal cord dysfunction with acute flare Pleural plaques consistent with asbestos lung disease -Normally wears 3 L oxygen via nasal cannula.  Presented in severe respiratory distress requiring nonrebreather.  Respiratory status is improving.  Currently on 3 L oxygen via nasal cannula.  CT chest as above. -Continue Solu-Medrol and current nebs.  Currently on Zithromax as well as per pulmonary.  Pulmonary following.  Follow recommendations. -Continue anxiolytics and pain meds  OSA -Not on CPAP at home.  Will need outpatient sleep study  Chronic diastolic heart failure Hypertension Hyperlipidemia History of nonobstructive CAD -Compensated.  Echo in February 2023 showed EF of 70 to 75% with grade 1 diastolic dysfunction.  Strict input and output.  Daily weights.  Outpatient follow-up with cardiology.  Continue Imdur, metoprolol succinate -Continue statin -No chest pain reported  Chronic pain with possible opiate dependence Anxiety/depression -Continue current regimen.  Outpatient follow-up with PCP/pain management  Hypokalemia -Resolved  Physical deconditioning -Consult  PT   DVT prophylaxis: Lovenox Code Status: DNR Family Communication: Pulmonary Disposition Plan: Status is: Inpatient Remains inpatient appropriate because: Of severity of illness    Consultants: Pulmonary  Procedures: None  Antimicrobials: Zithromax from 07/07/2023 onwards   Subjective: Patient seen and examined at bedside.  No chest pain, fever, vomiting or abdominal pain reported.  Feels slightly better but still short of breath with exertion with cough but not able to bring phlegm. Objective: Vitals:   07/08/23 0000 07/08/23 0009 07/08/23 0200 07/08/23 0400  BP: (!) 156/65  (!) 139/57 125/79  Pulse: 69  64 64  Resp: 14  17 13   Temp:  97.7 F (36.5 C)  97.6 F (36.4 C)  TempSrc:  Oral  Oral  SpO2: 97%  96% 93%  Weight:      Height:        Intake/Output Summary (Last 24 hours) at 07/08/2023 0716 Last data filed at 07/08/2023 0700 Gross per 24 hour  Intake 600 ml  Output 1975 ml  Net -1375 ml   Filed Weights   07/06/23 1500 07/06/23 2145  Weight: 67.6 kg 67.5 kg    Examination:  General: On 3 L oxygen via nasal cannula.  No distress.  Chronically ill and deconditioned looking. ENT/neck: No thyromegaly.  JVD is not elevated  respiratory: Decreased breath sounds at bases bilaterally with some crackles; no wheezing  CVS: S1-S2 heard, rate controlled currently Abdominal: Soft, nontender, slightly distended; no organomegaly, bowel sounds are heard Extremities: Trace lower extremity edema; no cyanosis  CNS: Awake and alert.  No focal neurologic deficit.  Moves extremities Lymph: No obvious lymphadenopathy Skin: No obvious ecchymosis/lesions  psych: Currently not agitated.  Intermittently anxious looking.   Musculoskeletal: No obvious joint swelling/deformity     Data Reviewed: I have personally reviewed following labs and imaging studies  CBC: Recent  Labs  Lab 07/06/23 1324 07/07/23 0301  WBC 7.5 8.9  NEUTROABS 5.2  --   HGB 10.7* 12.7  HCT 33.6*  40.2  MCV 90.1 89.7  PLT 177 213   Basic Metabolic Panel: Recent Labs  Lab 07/06/23 1324 07/07/23 0301  NA 138 138  K 3.3* 3.9  CL 106 103  CO2 23 23  GLUCOSE 91 160*  BUN 14 13  CREATININE 0.69 0.73  CALCIUM 8.1* 9.0  MG  --  2.3   GFR: Estimated Creatinine Clearance: 55.6 mL/min (by C-G formula based on SCr of 0.73 mg/dL). Liver Function Tests: Recent Labs  Lab 07/06/23 1324  AST 16  ALT 11  ALKPHOS 65  BILITOT 0.7  PROT 6.9  ALBUMIN 3.4*   No results for input(s): "LIPASE", "AMYLASE" in the last 168 hours. No results for input(s): "AMMONIA" in the last 168 hours. Coagulation Profile: No results for input(s): "INR", "PROTIME" in the last 168 hours. Cardiac Enzymes: No results for input(s): "CKTOTAL", "CKMB", "CKMBINDEX", "TROPONINI" in the last 168 hours. BNP (last 3 results) No results for input(s): "PROBNP" in the last 8760 hours. HbA1C: No results for input(s): "HGBA1C" in the last 72 hours. CBG: No results for input(s): "GLUCAP" in the last 168 hours. Lipid Profile: No results for input(s): "CHOL", "HDL", "LDLCALC", "TRIG", "CHOLHDL", "LDLDIRECT" in the last 72 hours. Thyroid Function Tests: No results for input(s): "TSH", "T4TOTAL", "FREET4", "T3FREE", "THYROIDAB" in the last 72 hours. Anemia Panel: No results for input(s): "VITAMINB12", "FOLATE", "FERRITIN", "TIBC", "IRON", "RETICCTPCT" in the last 72 hours. Sepsis Labs: No results for input(s): "PROCALCITON", "LATICACIDVEN" in the last 168 hours.  Recent Results (from the past 240 hour(s))  MRSA Next Gen by PCR, Nasal     Status: None   Collection Time: 07/06/23  9:48 PM   Specimen: Nasal Mucosa; Nasal Swab  Result Value Ref Range Status   MRSA by PCR Next Gen NOT DETECTED NOT DETECTED Final    Comment: (NOTE) The GeneXpert MRSA Assay (FDA approved for NASAL specimens only), is one component of a comprehensive MRSA colonization surveillance program. It is not intended to diagnose MRSA infection  nor to guide or monitor treatment for MRSA infections. Test performance is not FDA approved in patients less than 59 years old. Performed at [facility], [address], [place], [place] [address]          Radiology Studies: CT Chest W Contrast  Result Date: 07/06/2023 CLINICAL DATA:  Dyspnea and shortness of breath. EXAM: CT CHEST WITH CONTRAST TECHNIQUE: Multidetector CT imaging of the chest was performed during intravenous contrast administration. RADIATION DOSE REDUCTION: This exam was performed according to the departmental dose-optimization program which includes automated exposure control, adjustment of the mA and/or kV according to patient size and/or use of iterative reconstruction technique. CONTRAST:  75mL OMNIPAQUE IOHEXOL 300 MG/ML  SOLN COMPARISON:  Chest x-ray July 06, 2023 FINDINGS: Cardiovascular: Enlarged cardiac silhouette. Calcific atherosclerotic disease of the coronary arteries and aorta. Minimal pericardial thickening. Mediastinum/Nodes: Borderline enlarged mediastinal and hilar lymph nodes. Index precarinal lymph node measures 1 cm in short axis. 8 mm hypoattenuated lesion within the right thyroid gland. Patent main airways. Lungs/Pleura: Heavy bilateral pleural calcifications with associated linear nodular pleural and subpleural thickening. Most prominent nodular thickening is seen in the left lower lobe. Upper Abdomen: No acute abnormality. Musculoskeletal: Compression deformity of T7, T9 and T12 vertebral bodies, stable when compared to 03-21-22, CT of the thoracic spine. IMPRESSION: 1. Heavy bilateral  pleural calcifications with associated linear and nodular pleural and subpleural thickening. Most prominent nodular thickening is seen in the left lower lobe. Findings are consistent with asbestos related pleural disease or posttraumatic changes. Malignancy is not excluded. Further evaluation with PET-CT may be considered when clinically appropriate.  2. Borderline enlarged mediastinal and hilar lymph nodes, stable. 3. Enlarged cardiac silhouette. 4. Calcific atherosclerotic disease of the coronary arteries and aorta. 5. 8 mm hypoattenuated lesion within the right thyroid gland. Not clinically significant; no follow-up imaging recommended (ref: J Am Coll Radiol. 2015 Feb;12(2): 143-50). 6. Compression deformity of T7, T9 and T12 vertebral bodies, stable when compared to Jul 31, 2023, CT of the thoracic spine. 7. Aortic atherosclerosis. Aortic Atherosclerosis (ICD10-I70.0). Electronically Signed   By: Ted Mcalpine M.D.   On: 2023-07-31 17:01   DG Chest Portable 1 View  Result Date: 07/31/2023 CLINICAL DATA:  Dyspnea and shortness of breath EXAM: PORTABLE CHEST 1 VIEW COMPARISON:  06/02/2023 FINDINGS: Stable mild enlargement of the cardiopericardial silhouette. Atherosclerotic calcification of the aortic arch. Stable bilateral pleural calcifications. Against this background of densities, no new or progressive opacities are identified in the lungs. There is some mild scarring of the left lung base laterally. Stable mild fullness of the hila. IMPRESSION: 1. No acute findings. 2. Stable mild enlargement of the cardiopericardial silhouette. 3. Stable extensive bilateral pleural calcifications. 4. Stable fullness of the hila, adenopathy not excluded and was shown on prior CT of 04/02/2023. 5.  Aortic Atherosclerosis (ICD10-I70.0). Electronically Signed   By: Gaylyn Rong M.D.   On: July 31, 2023 14:14        Scheduled Meds:  ALPRAZolam  0.5 mg Oral TID   amLODipine  5 mg Oral Daily   aspirin EC  81 mg Oral QHS   azelastine  1 spray Each Nare q morning   azithromycin  500 mg Oral Daily   Chlorhexidine Gluconate Cloth  6 each Topical Daily   chlorpheniramine-HYDROcodone  5 mL Oral QHS   DULoxetine  30 mg Oral Daily   enoxaparin (LOVENOX) injection  40 mg Subcutaneous Q24H   famotidine  20 mg Oral QHS   fluticasone  1 spray Each Nare QHS    ipratropium-albuterol  3 mL Nebulization Q6H   isosorbide mononitrate  60 mg Oral BID   lamoTRIgine  200 mg Oral Daily   methylPREDNISolone sodium succinate  60 mg Intravenous Q24H   metoprolol succinate  100 mg Oral Daily   multivitamin with minerals  1 tablet Oral Daily   nystatin  5 mL Oral QID   mouth rinse  15 mL Mouth Rinse 4 times per day   pantoprazole  40 mg Oral BID AC   rosuvastatin  40 mg Oral QPM   sodium chloride  2 spray Each Nare BID   traZODone  100-200 mg Oral QHS   Continuous Infusions:        Glade Lloyd, MD [facility] 07/08/2023, 7:16 AM

## 2023-07-08 NOTE — Progress Notes (Signed)
NAME:  Linda [name], MRN:  [id number], DOB:  Apr 27, 1946, LOS: 2 ADMISSION DATE:  07/06/2023, CONSULTATION DATE: 07/06/2023 REFERRING MD: Dr. Loney Loh, CHIEF COMPLAINT: Respiratory distress  History of Present Illness:  77 yo female with hx of asbestos exposure and pleural plaques, chronic respiratory failure with hypoxia, and vocal cord dysfunction started having trouble with her breathing about 6 weeks ago. She has cough and throat irritation. Gets a rattling in her throat. Gets anxious when she has trouble with her breathing. Nebulizer treatments helps a little.   Pertinent  Medical History  Upper GI bleeding, Anxiety, Vertigo, CAD, HFpEF, Back pain, [name], Diverticulosis, Fibromyalgia, Hiatal hernia, Pneumonia, HTN, Idiopathic urticaria, Insomnia, IDA, Migraine headache, Nephrolithiasis, OSA, Pernicious anemia, PE 2014, Pyelonephritis, RBBB, Depression  Studies:  CT chest 07/06/23 >> heavy bilateral pleural calcifications with associated nodular pleural and subpleural thickening most prominent in Lt lower lobe  Interim History / Subjective:  She was able to get some sleep last night.  Still has globus sensation and cough.  Not bringing up phlegm.  Feels like neb treatments help.  Not making as much noise with her breathing.  Objective   Blood pressure 125/79, pulse 64, temperature 97.6 F (36.4 C), temperature source Oral, resp. rate 13, height 5\' 4"  (1.626 m), weight 67.5 kg, SpO2 93%.        Intake/Output Summary (Last 24 hours) at 07/08/2023 0806 Last data filed at 07/08/2023 0700 Gross per 24 hour  Intake 600 ml  Output 1975 ml  Net -1375 ml   Filed Weights   07/06/23 1500 07/06/23 2145  Weight: 67.6 kg 67.5 kg    Examination:  General - alert, frequent coughing spells with talking Eyes - pupils reactive ENT - mild stridor Cardiac - regular rate/rhythm, no murmur Chest - equal breath sounds b/l, no wheezing or rales Abdomen - soft, non tender, + bowel  sounds Extremities - no cyanosis, clubbing, or edema Skin - no rashes Neuro - normal strength, moves extremities, follows commands Psych - normal mood and behavior   Assessment & Plan:   Acute to subacute cough, throat irritation and respiratory distress. - most of her symptoms seem related to upper airway - continue nasal saline spray, azelastine, fluticasone nasal spray - tussionex at bedtime for another 2 nights - protonix bid and pepcid at bedtime - day 2 of zithromax - day 2 of nystatin swish and swallow - would benefit from speech therapy training as an outpt  Asthma. - change solumedrol to 40 mg every day on 7/16 - change to yupelri, brovana, pulmicort - prn albuterol   History of obstructive sleep apnea. - reassess as an outpt   Asbestos related pleural plaques. - has more prominent pleural thickening - will need PET scan as an outpt to assess further and then determine if she needs pleural biopsy   Anxiety, Depression, Chronic pain. - respiratory symptoms get worse when she gets anxious, and difficult to determine which comes first - continue anxiety and pain control regimen   Labs       Latest Ref Rng & Units 07/07/2023    3:01 AM 07/06/2023    1:24 PM 06/02/2023    4:35 PM  CMP  Glucose 70 - 99 mg/dL 147  91  97   BUN 8 - 23 mg/dL 13  14  17    Creatinine 0.44 - 1.00 mg/dL 8.29  5.62  1.30   Sodium 135 - 145 mmol/L 138  138  137   Potassium 3.5 -  5.1 mmol/L 3.9  3.3  3.1   Chloride 98 - 111 mmol/L 103  106  102   CO2 22 - 32 mmol/L 23  23  25    Calcium 8.9 - 10.3 mg/dL 9.0  8.1  8.6   Total Protein 6.5 - 8.1 g/dL  6.9  8.0   Total Bilirubin 0.3 - 1.2 mg/dL  0.7  0.5   Alkaline Phos 38 - 126 U/L  65  62   AST 15 - 41 U/L  16  19   ALT 0 - 44 U/L  11  12        Latest Ref Rng & Units 07/07/2023    3:01 AM 07/06/2023    1:24 PM 06/02/2023    4:35 PM  CBC  WBC 4.0 - 10.5 K/uL 8.9  7.5  7.6   Hemoglobin 12.0 - 15.0 g/dL 86.5  78.4  69.6   Hematocrit  36.0 - 46.0 % 40.2  33.6  42.1   Platelets 150 - 400 K/uL 213  177  217     Signature:  Coralyn Helling, MD [facility] Pager - [phone] or [phone] 07/08/2023, 8:06 AM

## 2023-07-09 ENCOUNTER — Other Ambulatory Visit: Payer: Medicare Other

## 2023-07-09 DIAGNOSIS — K219 Gastro-esophageal reflux disease without esophagitis: Secondary | ICD-10-CM

## 2023-07-09 DIAGNOSIS — R052 Subacute cough: Secondary | ICD-10-CM | POA: Diagnosis not present

## 2023-07-09 DIAGNOSIS — R058 Other specified cough: Secondary | ICD-10-CM | POA: Diagnosis not present

## 2023-07-09 MED ORDER — MONTELUKAST SODIUM 10 MG PO TABS
10.0000 mg | ORAL_TABLET | Freq: Every day | ORAL | Status: DC
  Administered 2023-07-09 – 2023-07-20 (×12): 10 mg via ORAL
  Filled 2023-07-09 (×12): qty 1

## 2023-07-09 MED ORDER — GUAIFENESIN ER 600 MG PO TB12
1200.0000 mg | ORAL_TABLET | Freq: Two times a day (BID) | ORAL | Status: DC
  Administered 2023-07-09 – 2023-07-21 (×25): 1200 mg via ORAL
  Filled 2023-07-09 (×25): qty 2

## 2023-07-09 MED ORDER — SODIUM CHLORIDE 3 % IN NEBU
4.0000 mL | INHALATION_SOLUTION | Freq: Two times a day (BID) | RESPIRATORY_TRACT | Status: DC
  Administered 2023-07-09 – 2023-07-10 (×3): 4 mL via RESPIRATORY_TRACT
  Filled 2023-07-09 (×3): qty 4

## 2023-07-09 MED ORDER — IPRATROPIUM-ALBUTEROL 0.5-2.5 (3) MG/3ML IN SOLN
3.0000 mL | RESPIRATORY_TRACT | Status: DC | PRN
  Administered 2023-07-09 – 2023-07-15 (×3): 3 mL via RESPIRATORY_TRACT
  Filled 2023-07-09 (×3): qty 3

## 2023-07-09 MED ORDER — ORAL CARE MOUTH RINSE: 15.0000 mL | OROMUCOSAL | Status: DC | PRN

## 2023-07-09 NOTE — Progress Notes (Signed)
PROGRESS NOTE  Linda [name] ZOX:[id number] DOB: 1946-12-21 DOA: 07/06/2023 PCP: Jeoffrey Massed, MD   LOS: 3 days   Brief Narrative / Interim history: 77 y.o. female with medical history significant of pulmonary asbestosis, chronic hypoxemic respiratory failure on 3 L home oxygen, vocal cord dysfunction, OSA, chronic HFpEF, history of PE, chronic pain, anxiety/depression, nonobstructive CAD, hypertension, idiopathic angioedema-urticaria, neuroleptic induced parkinsonism presented with worsening shortness of breath.  On presentation, she was in severe respiratory distress requiring nonrebreather.  CT chest with contrast showed findings consistent with asbestos related pleural disease or posttraumatic changes.  She was started on IV steroids.  Pulmonary was consulted.   Subjective / 24h Interval events: -Doing well, feeling little bit better this morning  Assesement and Plan: Principal Problem:   Acute on chronic respiratory failure (HCC) Active Problems:   Chronic pain   OSA (obstructive sleep apnea)   Chronic heart failure with preserved ejection fraction (HFpEF) (HCC)   Anxiety   Hypokalemia   CAD-minor 2014   Pulmonary asbestosis (HCC)   Vocal cord dysfunction   Principal problem Acute on chronic hypoxic respiratory failure, Vocal cord dysfunction with acute flare, Pleural plaques consistent with asbestos lung disease -Normally wears 3 L oxygen via nasal cannula.  Presented in severe respiratory distress requiring nonrebreather.  Respiratory status is improving.  Respiratory status currently stable, back to 3 L -Pulmonary consulted, appreciate input -Continue Solu-Medrol and current nebs.  Currently on Zithromax as well as per pulmonary.   -Continue anxiolytics and pain meds  Active problems OSA -Not on CPAP at home.  Will need outpatient sleep study   Chronic diastolic heart failure Hypertension Hyperlipidemia History of nonobstructive CAD -Compensated.  Echo in  February 2023 showed EF of 70 to 75% with grade 1 diastolic dysfunction.  Strict input and output.  Daily weights.  Outpatient follow-up with cardiology.  Continue Imdur, metoprolol succinate -Continue statin -no chest pain   Chronic pain with possible opiate dependence Anxiety/depression -Continue current regimen.  Outpatient follow-up with PCP/pain management   Hypokalemia -replete and continue to monitor   Physical deconditioning -Consult PT  Scheduled Meds:  ALPRAZolam  0.5 mg Oral TID   amLODipine  5 mg Oral Daily   arformoterol  15 mcg Nebulization BID   aspirin EC  81 mg Oral QHS   azelastine  1 spray Each Nare q morning   azithromycin  500 mg Oral Daily   budesonide (PULMICORT) nebulizer solution  0.25 mg Nebulization BID   Chlorhexidine Gluconate Cloth  6 each Topical Daily   chlorpheniramine-HYDROcodone  5 mL Oral QHS   DULoxetine  30 mg Oral Daily   enoxaparin (LOVENOX) injection  40 mg Subcutaneous Q24H   famotidine  20 mg Oral QHS   fluticasone  1 spray Each Nare QHS   guaiFENesin  1,200 mg Oral BID   isosorbide mononitrate  60 mg Oral BID   lamoTRIgine  200 mg Oral Daily   methylPREDNISolone (SOLU-MEDROL) injection  40 mg Intravenous Q24H   metoprolol succinate  100 mg Oral Daily   montelukast  10 mg Oral QHS   multivitamin with minerals  1 tablet Oral Daily   nystatin  5 mL Oral QID   mouth rinse  15 mL Mouth Rinse 4 times per day   pantoprazole  40 mg Oral BID AC   revefenacin  175 mcg Nebulization Daily   rosuvastatin  40 mg Oral QPM   sodium chloride  2 spray Each Nare BID   sodium chloride  HYPERTONIC  4 mL Nebulization BID   traZODone  100-200 mg Oral QHS   Continuous Infusions: PRN Meds:.albuterol, hydrALAZINE, methocarbamol, ondansetron (ZOFRAN) IV, mouth rinse, oxyCODONE-acetaminophen **AND** oxyCODONE  Current Outpatient Medications  Medication Instructions   albuterol (VENTOLIN HFA) 108 (90 Base) MCG/ACT inhaler 1-2 puffs, Inhalation, Every 6  hours PRN   ALPRAZolam (XANAX) 0.5 MG tablet TAKE ONE TABLET BY MOUTH THREE TIMES DAILY   amLODipine (NORVASC) 5 mg, Oral, Daily   aspirin 81 mg, Oral, Daily at bedtime   benzonatate (TESSALON) 100 mg, Oral, 2 times daily PRN   BREZTRI AEROSPHERE 160-9-4.8 MCG/ACT AERO 2 puffs, Inhalation, 2 times daily   buprenorphine (BUTRANS) 5 MCG/HR PTWK 1 patch, Transdermal, Weekly   cyclobenzaprine (FLEXERIL) 5 mg, Oral, 2 times daily PRN   diclofenac Sodium (VOLTAREN) 2 g, Topical, 4 times daily, 4 times a day to both knees   DULoxetine (CYMBALTA) 60 mg, Oral, Daily   EPINEPHrine (EPI-PEN) 0.3 mg, Intramuscular, As needed   ipratropium (ATROVENT) 0.03 % nasal spray 2 sprays, Each Nare, Every 12 hours PRN   ipratropium-albuterol (DUONEB) 0.5-2.5 (3) MG/3ML SOLN 3 mLs, Nebulization, Every 4 hours PRN   isosorbide mononitrate (IMDUR) 60 mg, Oral, 2 times daily   lamoTRIgine (LAMICTAL) 200 mg, Oral, Daily   methocarbamol (ROBAXIN) 500 mg, Oral, Every 8 hours PRN   metoprolol succinate (TOPROL-XL) 100 MG 24 hr tablet take ONE tab daily, take additional 1/2 tab as needed FOR FOR BLOOD PRESSURE > 160/90   metoprolol tartrate (LOPRESSOR) 50 MG tablet Take 2 hours prior to CT scan   Multiple Vitamin (MULTIVITAMIN WITH MINERALS) TABS tablet 1 tablet, Oral, Daily with breakfast   nitroGLYCERIN (NITROSTAT) 0.4 mg, Sublingual, Every 5 min PRN   oxyCODONE-acetaminophen (PERCOCET) 10-325 MG tablet 1 tablet, Oral, Every 6 hours PRN   OXYGEN 3 L/min, Inhalation, Continuous   pantoprazole (PROTONIX) 40 mg, Oral, 2 times daily   rOPINIRole (REQUIP) 0.5 MG tablet TAKE ONE TABLET BY MOUTH AT BEDTIME   rosuvastatin (CRESTOR) 40 mg, Oral, Every evening   solifenacin (VESICARE) 10 MG tablet TAKE ONE TABLET BY MOUTH ONCE DAILY **NEED OFFICE VISIT** FOR FURTHER REFILLS   traZODone (DESYREL) 50 MG tablet take 2-4 tablets at bedtime as needed for insomnia.    Diet Orders (From admission, onward)     Start     Ordered    07/06/23 2036  Diet Heart Room service appropriate? Yes; Fluid consistency: Thin  Diet effective now       Question Answer Comment  Room service appropriate? Yes   Fluid consistency: Thin      07/06/23 2036            DVT prophylaxis: enoxaparin (LOVENOX) injection 40 mg Start: 07/06/23 2200   Lab Results  Component Value Date   PLT 213 07/07/2023      Code Status: DNR  Family Communication: no family at bedside   Status is: Inpatient Remains inpatient appropriate because: severity of illness  Level of care: Stepdown  Consultants:  PCCM  Objective: Vitals:   07/09/23 0753 07/09/23 0755 07/09/23 0800 07/09/23 1024  BP:   (!) 163/55   Pulse:   61   Resp:   12   Temp:      TempSrc:      SpO2: 97% 96% 99% 95%  Weight:      Height:        Intake/Output Summary (Last 24 hours) at 07/09/2023 1027 Last data filed at 07/09/2023 0911 Gross per  24 hour  Intake --  Output 1420 ml  Net -1420 ml   Wt Readings from Last 3 Encounters:  07/06/23 67.5 kg  07/02/23 67.6 kg  06/11/23 67.1 kg    Examination:  Constitutional: NAD Eyes: no scleral icterus ENMT: Mucous membranes are moist.  Neck: normal, supple Respiratory: clear to auscultation bilaterally, no wheezing, no crackles.  Cardiovascular: Regular rate and rhythm, no murmurs / rubs / gallops Abdomen: non distended, no tenderness. Bowel sounds positive.  Musculoskeletal: no clubbing / cyanosis.    Data Reviewed: I have independently reviewed following labs and imaging studies   CBC Recent Labs  Lab 07/06/23 1324 07/07/23 0301  WBC 7.5 8.9  HGB 10.7* 12.7  HCT 33.6* 40.2  PLT 177 213  MCV 90.1 89.7  MCH 28.7 28.3  MCHC 31.8 31.6  RDW 13.2 13.0  LYMPHSABS 1.4  --   MONOABS 0.7  --   EOSABS 0.3  --   BASOSABS 0.0  --     Recent Labs  Lab 07/06/23 1324 07/07/23 0301  NA 138 138  K 3.3* 3.9  CL 106 103  CO2 23 23  GLUCOSE 91 160*  BUN 14 13  CREATININE 0.69 0.73  CALCIUM 8.1* 9.0  AST  16  --   ALT 11  --   ALKPHOS 65  --   BILITOT 0.7  --   ALBUMIN 3.4*  --   MG  --  2.3  BNP 83.4  --     ------------------------------------------------------------------------------------------------------------------ No results for input(s): "CHOL", "HDL", "LDLCALC", "TRIG", "CHOLHDL", "LDLDIRECT" in the last 72 hours.  Lab Results  Component Value Date   HGBA1C 5.3 02/27/2022   ------------------------------------------------------------------------------------------------------------------ No results for input(s): "TSH", "T4TOTAL", "T3FREE", "THYROIDAB" in the last 72 hours.  Invalid input(s): "FREET3"  Cardiac Enzymes No results for input(s): "CKMB", "TROPONINI", "MYOGLOBIN" in the last 168 hours.  Invalid input(s): "CK" ------------------------------------------------------------------------------------------------------------------    Component Value Date/Time   BNP 83.4 07/06/2023 1324   BNP 19.9 10/27/2015 1602    CBG: No results for input(s): "GLUCAP" in the last 168 hours.  Recent Results (from the past 240 hour(s))  MRSA Next Gen by PCR, Nasal     Status: None   Collection Time: 07/06/23  9:48 PM   Specimen: Nasal Mucosa; Nasal Swab  Result Value Ref Range Status   MRSA by PCR Next Gen NOT DETECTED NOT DETECTED Final    Comment: (NOTE) The GeneXpert MRSA Assay (FDA approved for NASAL specimens only), is one component of a comprehensive MRSA colonization surveillance program. It is not intended to diagnose MRSA infection nor to guide or monitor treatment for MRSA infections. Test performance is not FDA approved in patients less than 11 years old. Performed at [facility], [address] [place], [place] [address]      Radiology Studies: No results found.   Pamella Pert, MD, PhD [facility]  Between 7 am - 7 pm I am available, please contact me via Amion (for emergencies) or Securechat (non urgent  messages)  Between 7 pm - 7 am I am not available, please contact night coverage MD/APP via Amion

## 2023-07-09 NOTE — Plan of Care (Signed)

## 2023-07-09 NOTE — Progress Notes (Signed)
NAME:  Linda [name], MRN:  [id number], DOB:  [birth year], LOS: 3 ADMISSION DATE:  07/06/2023, CONSULTATION DATE: 07/06/2023 REFERRING MD: Dr. Loney Loh, CHIEF COMPLAINT: Respiratory distress  History of Present Illness:  77 yo female with hx of asbestos exposure and pleural plaques, chronic respiratory failure with hypoxia, and vocal cord dysfunction started having trouble with her breathing about 6 weeks ago. She has cough and throat irritation. Gets a rattling in her throat. Gets anxious when she has trouble with her breathing. Nebulizer treatments helps a little.   Pertinent  Medical History  Upper GI bleeding, Anxiety, Vertigo, CAD, HFpEF, Back pain, [name], Diverticulosis, Fibromyalgia, Hiatal hernia, Pneumonia, HTN, Idiopathic urticaria, Insomnia, IDA, Migraine headache, Nephrolithiasis, OSA, Pernicious anemia, PE 2014, Pyelonephritis, RBBB, Depression  Studies:  CT chest 07/06/23 >> heavy bilateral pleural calcifications with associated nodular pleural and subpleural thickening most prominent in Lt lower lobe  Interim History / Subjective:  Still has cough, feels more congested in her chest.  Still feels wheezy.  Objective   Blood pressure (!) 163/55, pulse 61, temperature (!) 97.5 F (36.4 C), temperature source Oral, resp. rate 12, height 5\' 4"  (1.626 m), weight 67.5 kg, SpO2 99%.    FiO2 (%):  [32 %] 32 %   Intake/Output Summary (Last 24 hours) at 07/09/2023 0905 Last data filed at 07/09/2023 8295 Gross per 24 hour  Intake --  Output 970 ml  Net -970 ml   Filed Weights   07/06/23 1500 07/06/23 2145  Weight: 67.6 kg 67.5 kg    Examination:  General - coughing spells with talking Eyes - pupils reactive ENT - raspy voice Cardiac - regular rate/rhythm, no murmur Chest - faint bilateral wheeze Abdomen - soft, non tender, + bowel sounds Extremities - no cyanosis, clubbing, or edema Skin - no rashes Neuro - normal strength, moves extremities, follows commands Psych -  normal mood and behavior  Assessment & Plan:   Acute to subacute cough, throat irritation and respiratory distress. - a good portion of this is related to upper airway irritation - continue nasal saline spray, azelastine, fluticasone nasal spray - tussionex at bedtime for one more night - continue protonix bid and pepcid at bedtime - day 3 of zithromax - day 3 of nystatin swish and swallow - would benefit from speech therapy training as an outpt  Asthma. - continue solumedrol 40 mg daily - add singulair 10 mg at bedtime - continue yupelri, brovana, pulmicort - flutter valve, mucinex, and hypertonic saline bid for next 2 days - prn albuterol   History of obstructive sleep apnea. - reassess as an outpt   Asbestos related pleural plaques. - has more prominent pleural thickening - will need PET scan as an outpt to assess further and then determine if she needs pleural biopsy   Anxiety, Depression, Chronic pain. - respiratory symptoms get worse when she gets anxious, and difficult to determine which comes first - continue anxiety and pain control regimen   Labs       Latest Ref Rng & Units 07/07/2023    3:01 AM 07/06/2023    1:24 PM 06/02/2023    4:35 PM  CMP  Glucose 70 - 99 mg/dL 621  91  97   BUN 8 - 23 mg/dL 13  14  17    Creatinine 0.44 - 1.00 mg/dL 3.08  6.57  8.46   Sodium 135 - 145 mmol/L 138  138  137   Potassium 3.5 - 5.1 mmol/L 3.9  3.3  3.1  Chloride 98 - 111 mmol/L 103  106  102   CO2 22 - 32 mmol/L 23  23  25    Calcium 8.9 - 10.3 mg/dL 9.0  8.1  8.6   Total Protein 6.5 - 8.1 g/dL  6.9  8.0   Total Bilirubin 0.3 - 1.2 mg/dL  0.7  0.5   Alkaline Phos 38 - 126 U/L  65  62   AST 15 - 41 U/L  16  19   ALT 0 - 44 U/L  11  12        Latest Ref Rng & Units 07/07/2023    3:01 AM 07/06/2023    1:24 PM 06/02/2023    4:35 PM  CBC  WBC 4.0 - 10.5 K/uL 8.9  7.5  7.6   Hemoglobin 12.0 - 15.0 g/dL 42.7  06.2  37.6   Hematocrit 36.0 - 46.0 % 40.2  33.6  42.1    Platelets 150 - 400 K/uL 213  177  217     Signature:  Coralyn Helling, MD [facility] Pager - [phone] or [phone] 07/09/2023, 9:05 AM

## 2023-07-10 ENCOUNTER — Inpatient Hospital Stay (HOSPITAL_COMMUNITY): Payer: Medicare Other

## 2023-07-10 DIAGNOSIS — G4733 Obstructive sleep apnea (adult) (pediatric): Secondary | ICD-10-CM

## 2023-07-10 DIAGNOSIS — F419 Anxiety disorder, unspecified: Secondary | ICD-10-CM | POA: Diagnosis not present

## 2023-07-10 DIAGNOSIS — R052 Subacute cough: Secondary | ICD-10-CM | POA: Diagnosis not present

## 2023-07-10 DIAGNOSIS — J383 Other diseases of vocal cords: Secondary | ICD-10-CM | POA: Diagnosis not present

## 2023-07-10 LAB — COMPREHENSIVE METABOLIC PANEL
ALT: 16 U/L (ref 0–44)
AST: 17 U/L (ref 15–41)
Albumin: 3.2 g/dL — ABNORMAL LOW (ref 3.5–5.0)
Alkaline Phosphatase: 59 U/L (ref 38–126)
Anion gap: 9 (ref 5–15)
BUN: 20 mg/dL (ref 8–23)
CO2: 26 mmol/L (ref 22–32)
Calcium: 8.2 mg/dL — ABNORMAL LOW (ref 8.9–10.3)
Chloride: 102 mmol/L (ref 98–111)
Creatinine, Ser: 0.81 mg/dL (ref 0.44–1.00)
GFR, Estimated: >60 mL/min (ref >60)
Glucose, Bld: 118 mg/dL — ABNORMAL HIGH (ref 70–99)
Potassium: 3.4 mmol/L — ABNORMAL LOW (ref 3.5–5.1)
Sodium: 137 mmol/L (ref 135–145)
Total Bilirubin: 0.7 mg/dL (ref 0.3–1.2)
Total Protein: 6.4 g/dL — ABNORMAL LOW (ref 6.5–8.1)

## 2023-07-10 LAB — CBC
HCT: 38 % (ref 36.0–46.0)
Hemoglobin: 12.1 g/dL (ref 12.0–15.0)
MCH: 28.7 pg (ref 26.0–34.0)
MCHC: 31.8 g/dL (ref 30.0–36.0)
MCV: 90.3 fL (ref 80.0–100.0)
Platelets: 226 10*3/uL (ref 150–400)
RBC: 4.21 MIL/uL (ref 3.87–5.11)
RDW: 13.1 % (ref 11.5–15.5)
WBC: 13.9 10*3/uL — ABNORMAL HIGH (ref 4.0–10.5)
nRBC: 0 % (ref 0.0–0.2)

## 2023-07-10 LAB — RESPIRATORY PANEL BY PCR

## 2023-07-10 LAB — SARS CORONAVIRUS 2 BY RT PCR: SARS Coronavirus 2 by RT PCR: NEGATIVE

## 2023-07-10 LAB — MAGNESIUM: Magnesium: 2.3 mg/dL (ref 1.7–2.4)

## 2023-07-10 MED ORDER — HYDROCOD POLI-CHLORPHE POLI ER 10-8 MG/5ML PO SUER
5.0000 mL | Freq: Two times a day (BID) | ORAL | Status: AC
  Administered 2023-07-10 – 2023-07-12 (×6): 5 mL via ORAL
  Filled 2023-07-10 (×6): qty 5

## 2023-07-10 MED ORDER — GABAPENTIN 100 MG PO CAPS
100.0000 mg | ORAL_CAPSULE | Freq: Two times a day (BID) | ORAL | Status: DC
  Administered 2023-07-10 – 2023-07-21 (×22): 100 mg via ORAL
  Filled 2023-07-10 (×22): qty 1

## 2023-07-10 NOTE — Plan of Care (Signed)
?  Problem: Education: ?Goal: Knowledge of General Education information will improve ?Description: Including pain rating scale, medication(s)/side effects and non-pharmacologic comfort measures ?Outcome: Progressing ?  ?Problem: Health Behavior/Discharge Planning: ?Goal: Ability to manage health-related needs will improve ?Outcome: Progressing ?  ?Problem: Clinical Measurements: ?Goal: Ability to maintain clinical measurements within normal limits will improve ?Outcome: Progressing ?Goal: Will remain free from infection ?Outcome: Progressing ?Goal: Diagnostic test results will improve ?Outcome: Progressing ?Goal: Cardiovascular complication will be avoided ?Outcome: Progressing ?  ?Problem: Activity: ?Goal: Risk for activity intolerance will decrease ?Outcome: Progressing ?  ?Problem: Nutrition: ?Goal: Adequate nutrition will be maintained ?Outcome: Progressing ?  ?Problem: Coping: ?Goal: Level of anxiety will decrease ?Outcome: Progressing ?  ?Problem: Elimination: ?Goal: Will not experience complications related to bowel motility ?Outcome: Progressing ?Goal: Will not experience complications related to urinary retention ?Outcome: Progressing ?  ?Problem: Pain Managment: ?Goal: General experience of comfort will improve ?Outcome: Progressing ?  ?Problem: Safety: ?Goal: Ability to remain free from injury will improve ?Outcome: Progressing ?  ?Problem: Skin Integrity: ?Goal: Risk for impaired skin integrity will decrease ?Outcome: Progressing ?  ?Problem: Clinical Measurements: ?Goal: Respiratory complications will improve ?Outcome: Not Progressing ?  ?

## 2023-07-10 NOTE — Procedures (Addendum)
Modified Barium Swallow Study  Patient Details  Name: Linda [name] MRN: [id number] Date of Birth: Jan 15, 1946  Today's Date: 07/10/2023  Modified Barium Swallow completed.  Full report located under Chart Review in the Imaging Section.  History of Present Illness Per hospitalist note "77 y.o. female with medical history significant of pulmonary asbestosis, chronic hypoxemic respiratory failure on 3 L home oxygen, vocal cord dysfunction, OSA, chronic HFpEF, history of PE, chronic pain, anxiety/depression, nonobstructive CAD, hypertension, idiopathic angioedema-urticaria, neuroleptic induced parkinsonism presented with worsening shortness of breath.  On presentation, she was in severe respiratory distress requiring nonrebreather.  CT chest with contrast showed findings consistent with asbestos related pleural disease or posttraumatic changes.  She was started on IV steroids.  Pulmonary was consulted."  Pulmonary ordered esophagram due to pt's extensive esophageal history including esophageal stricture.  Radiologist deemed SLP evaluation needed first to assure pt could swallow lying down - therefore CCM ordered MBS - and esophagram.  Imaging of neck showed  Chronic degenerative ankylosis of the C5 and C6 vertebral bodies.  CT chest "Heavy bilateral pleural calcifications with associated linear and  nodular pleural and subpleural thickening. Most prominent nodular  thickening is seen in the left lower lobe. Findings are consistent  with asbestos related pleural disease or posttraumatic changes."  Old lacunar  infarction in the right caudate head seen on prior MRI.  RN reports pt coughing frequently but cannot differentiate if coughing could be related to aspiration during or after po.   Clinical Impression Minimal barium provided to pt for MBS to assure pt able to adequately protect her airway with liquid consumption given esophagram ordered and concern for aspiration.  SlP tested pt reclined with  liquids between nectar and thin consistency - with pt demonstrating adequate airway protection.  Pt was tested with head of chair reclined to approx 45* to emulate esophagram with adequate airway protection observed.  Also tested with thin via tsp,cup, thin/nectar mixture via straw and nectar liquids tsp, cup, straw- Trace upper laryngeal penetration of thin cleared with further swallow.  Pharyngeal swallow is strong without retention.   She does appear with significantly prominent cricopharyngeus.  Spoke to radiologist regarding pt's findings. Factors that may increase risk of adverse event in presence of aspiration [name] & Clearance Coots 2021):    Swallow Evaluation Recommendations Recommendations:  (thin liquids and defer to MD for solid food recommendation) Liquid Administration via: Cup;Straw Medication Administration: Other (Comment) Supervision: Patient able to self-feed Swallowing strategies  : Slow rate;Small bites/sips Postural changes: Position pt fully upright for meals Oral care recommendations: Oral care BID (2x/day)   Rolena Infante, MS [facility] SLP Acute Rehab Services Office [phone]    Chales Abrahams 07/10/2023,3:49 PM

## 2023-07-10 NOTE — Progress Notes (Signed)
PROGRESS NOTE  [name] WUJ:[id number] DOB: Oct 13, 1946 DOA: 07/06/2023 PCP: Jeoffrey Massed, MD   LOS: 4 days   Brief Narrative / Interim history: 77 y.o. female with medical history significant of pulmonary asbestosis, chronic hypoxemic respiratory failure on 3 L home oxygen, vocal cord dysfunction, OSA, chronic HFpEF, history of PE, chronic pain, anxiety/depression, nonobstructive CAD, hypertension, idiopathic angioedema-urticaria, neuroleptic induced parkinsonism presented with worsening shortness of breath.  On presentation, she was in severe respiratory distress requiring nonrebreather.  CT chest with contrast showed findings consistent with asbestos related pleural disease or posttraumatic changes.  She was started on IV steroids.  Pulmonary was consulted.   Subjective / 24h Interval events: -Complains of ongoing rattling in her chest, incessant cough but no sputum production.  Seems to be a bit frustrated she does not understand what is going on and why it has been happening for the past several weeks  Assesement and Plan: Principal Problem:   Acute on chronic respiratory failure (HCC) Active Problems:   Chronic pain   OSA (obstructive sleep apnea)   Chronic heart failure with preserved ejection fraction (HFpEF) (HCC)   Anxiety   Hypokalemia   CAD-minor 2014   Pulmonary asbestosis (HCC)   Vocal cord dysfunction   Principal problem Acute on chronic hypoxic respiratory failure, Vocal cord dysfunction with acute flare, Pleural plaques consistent with asbestos lung disease -Normally wears 3 L oxygen via nasal cannula.  Presented in severe respiratory distress requiring nonrebreather.  Respiratory status is improving.  Respiratory status currently stable, back to 3 L -Pulmonary consulted, appreciate input -Continue Solu-Medrol and current nebs.  Currently on Zithromax as well as per pulmonary.   -Continue anxiolytics and pain meds -Esophagogram today, continue hypertonic  saline.  Have asked the patient to sit up in the recliner chair  Active problems OSA -Not on CPAP at home.  Will need outpatient sleep study   Chronic diastolic heart failure Hypertension Hyperlipidemia History of nonobstructive CAD -Compensated.  Echo in February 2023 showed EF of 70 to 75% with grade 1 diastolic dysfunction.  Strict input and output.  Daily weights.  Outpatient follow-up with cardiology.  Continue Imdur, metoprolol succinate -Continue statin -no chest pain   Chronic pain with possible opiate dependence Anxiety/depression -Continue current regimen.  Outpatient follow-up with PCP/pain management   Hypokalemia -replete and continue to monitor   Physical deconditioning -Consult PT  Scheduled Meds:  ALPRAZolam  0.5 mg Oral TID   amLODipine  5 mg Oral Daily   arformoterol  15 mcg Nebulization BID   aspirin EC  81 mg Oral QHS   azelastine  1 spray Each Nare q morning   azithromycin  500 mg Oral Daily   budesonide (PULMICORT) nebulizer solution  0.25 mg Nebulization BID   Chlorhexidine Gluconate Cloth  6 each Topical Daily   chlorpheniramine-HYDROcodone  5 mL Oral Q12H   DULoxetine  30 mg Oral Daily   enoxaparin (LOVENOX) injection  40 mg Subcutaneous Q24H   famotidine  20 mg Oral QHS   fluticasone  1 spray Each Nare QHS   guaiFENesin  1,200 mg Oral BID   isosorbide mononitrate  60 mg Oral BID   lamoTRIgine  200 mg Oral Daily   methylPREDNISolone (SOLU-MEDROL) injection  40 mg Intravenous Q24H   metoprolol succinate  100 mg Oral Daily   montelukast  10 mg Oral QHS   multivitamin with minerals  1 tablet Oral Daily   nystatin  5 mL Oral QID   mouth rinse  15 mL Mouth Rinse 4 times per day   pantoprazole  40 mg Oral BID AC   revefenacin  175 mcg Nebulization Daily   rosuvastatin  40 mg Oral QPM   sodium chloride  2 spray Each Nare BID   traZODone  100-200 mg Oral QHS   Continuous Infusions: PRN Meds:.hydrALAZINE, ipratropium-albuterol, methocarbamol,  ondansetron (ZOFRAN) IV, mouth rinse, mouth rinse, oxyCODONE-acetaminophen **AND** oxyCODONE  Current Outpatient Medications  Medication Instructions   albuterol (VENTOLIN HFA) 108 (90 Base) MCG/ACT inhaler 1-2 puffs, Inhalation, Every 6 hours PRN   ALPRAZolam (XANAX) 0.5 MG tablet TAKE ONE TABLET BY MOUTH THREE TIMES DAILY   amLODipine (NORVASC) 5 mg, Oral, Daily   aspirin 81 mg, Oral, Daily at bedtime   benzonatate (TESSALON) 100 mg, Oral, 2 times daily PRN   BREZTRI AEROSPHERE 160-9-4.8 MCG/ACT AERO 2 puffs, Inhalation, 2 times daily   buprenorphine (BUTRANS) 5 MCG/HR PTWK 1 patch, Transdermal, Weekly   cyclobenzaprine (FLEXERIL) 5 mg, Oral, 2 times daily PRN   diclofenac Sodium (VOLTAREN) 2 g, Topical, 4 times daily, 4 times a day to both knees   DULoxetine (CYMBALTA) 60 mg, Oral, Daily   EPINEPHrine (EPI-PEN) 0.3 mg, Intramuscular, As needed   ipratropium (ATROVENT) 0.03 % nasal spray 2 sprays, Each Nare, Every 12 hours PRN   ipratropium-albuterol (DUONEB) 0.5-2.5 (3) MG/3ML SOLN 3 mLs, Nebulization, Every 4 hours PRN   isosorbide mononitrate (IMDUR) 60 mg, Oral, 2 times daily   lamoTRIgine (LAMICTAL) 200 mg, Oral, Daily   methocarbamol (ROBAXIN) 500 mg, Oral, Every 8 hours PRN   metoprolol succinate (TOPROL-XL) 100 MG 24 hr tablet take ONE tab daily, take additional 1/2 tab as needed FOR FOR BLOOD PRESSURE > 160/90   metoprolol tartrate (LOPRESSOR) 50 MG tablet Take 2 hours prior to CT scan   Multiple Vitamin (MULTIVITAMIN WITH MINERALS) TABS tablet 1 tablet, Oral, Daily with breakfast   nitroGLYCERIN (NITROSTAT) 0.4 mg, Sublingual, Every 5 min PRN   oxyCODONE-acetaminophen (PERCOCET) 10-325 MG tablet 1 tablet, Oral, Every 6 hours PRN   OXYGEN 3 L/min, Inhalation, Continuous   pantoprazole (PROTONIX) 40 mg, Oral, 2 times daily   rOPINIRole (REQUIP) 0.5 MG tablet TAKE ONE TABLET BY MOUTH AT BEDTIME   rosuvastatin (CRESTOR) 40 mg, Oral, Every evening   solifenacin (VESICARE) 10 MG  tablet TAKE ONE TABLET BY MOUTH ONCE DAILY **NEED OFFICE VISIT** FOR FURTHER REFILLS   traZODone (DESYREL) 50 MG tablet take 2-4 tablets at bedtime as needed for insomnia.    Diet Orders (From admission, onward)     Start     Ordered   07/06/23 2036  Diet Heart Room service appropriate? Yes; Fluid consistency: Thin  Diet effective now       Question Answer Comment  Room service appropriate? Yes   Fluid consistency: Thin      07/06/23 2036            DVT prophylaxis: enoxaparin (LOVENOX) injection 40 mg Start: 07/06/23 2200   Lab Results  Component Value Date   PLT 226 07/10/2023      Code Status: DNR  Family Communication: no family at bedside   Status is: Inpatient Remains inpatient appropriate because: severity of illness  Level of care: Stepdown  Consultants:  PCCM  Objective: Vitals:   07/10/23 0825 07/10/23 0826 07/10/23 0900 07/10/23 1008  BP:   (!) 152/75 (!) 158/131  Pulse:   64 73  Resp:   12   Temp:      TempSrc:  SpO2: 98% 100% 92%   Weight:      Height:        Intake/Output Summary (Last 24 hours) at 07/10/2023 1114 Last data filed at 07/09/2023 2300 Gross per 24 hour  Intake --  Output 700 ml  Net -700 ml   Wt Readings from Last 3 Encounters:  07/06/23 67.5 kg  07/02/23 67.6 kg  06/11/23 67.1 kg    Examination:  Constitutional: NAD Eyes: lids and conjunctivae normal, no scleral icterus ENMT: mmm Neck: normal, supple Respiratory: Diffuse bilateral rhonchi present Cardiovascular: Regular rate and rhythm, no murmurs / rubs / gallops. No LE edema. Abdomen: soft, no distention, no tenderness. Bowel sounds positive.    Data Reviewed: I have independently reviewed following labs and imaging studies   CBC Recent Labs  Lab 07/06/23 1324 07/07/23 0301 07/10/23 0326  WBC 7.5 8.9 13.9*  HGB 10.7* 12.7 12.1  HCT 33.6* 40.2 38.0  PLT 177 213 226  MCV 90.1 89.7 90.3  MCH 28.7 28.3 28.7  MCHC 31.8 31.6 31.8  RDW 13.2 13.0 13.1   LYMPHSABS 1.4  --   --   MONOABS 0.7  --   --   EOSABS 0.3  --   --   BASOSABS 0.0  --   --     Recent Labs  Lab 07/06/23 1324 07/07/23 0301 07/10/23 0326  NA 138 138 137  K 3.3* 3.9 3.4*  CL 106 103 102  CO2 23 23 26   GLUCOSE 91 160* 118*  BUN 14 13 20   CREATININE 0.69 0.73 0.81  CALCIUM 8.1* 9.0 8.2*  AST 16  --  17  ALT 11  --  16  ALKPHOS 65  --  59  BILITOT 0.7  --  0.7  ALBUMIN 3.4*  --  3.2*  MG  --  2.3 2.3  BNP 83.4  --   --     ------------------------------------------------------------------------------------------------------------------ No results for input(s): "CHOL", "HDL", "LDLCALC", "TRIG", "CHOLHDL", "LDLDIRECT" in the last 72 hours.  Lab Results  Component Value Date   HGBA1C 5.3 02/27/2022   ------------------------------------------------------------------------------------------------------------------ No results for input(s): "TSH", "T4TOTAL", "T3FREE", "THYROIDAB" in the last 72 hours.  Invalid input(s): "FREET3"  Cardiac Enzymes No results for input(s): "CKMB", "TROPONINI", "MYOGLOBIN" in the last 168 hours.  Invalid input(s): "CK" ------------------------------------------------------------------------------------------------------------------    Component Value Date/Time   BNP 83.4 07/06/2023 1324   BNP 19.9 10/27/2015 1602    CBG: No results for input(s): "GLUCAP" in the last 168 hours.  Recent Results (from the past 240 hour(s))  MRSA Next Gen by PCR, Nasal     Status: None   Collection Time: 07/06/23  9:48 PM   Specimen: Nasal Mucosa; Nasal Swab  Result Value Ref Range Status   MRSA by PCR Next Gen NOT DETECTED NOT DETECTED Final    Comment: (NOTE) The GeneXpert MRSA Assay (FDA approved for NASAL specimens only), is one component of a comprehensive MRSA colonization surveillance program. It is not intended to diagnose MRSA infection nor to guide or monitor treatment for MRSA infections. Test performance is not FDA  approved in patients less than 18 years old. Performed at [facility], [address] [place], [place] [address]   Respiratory (~20 pathogens) panel by PCR     Status: None   Collection Time: 07/10/23  8:03 AM   Specimen: Nasopharyngeal Swab; Respiratory  Result Value Ref Range Status   Adenovirus NOT DETECTED NOT DETECTED Final   Coronavirus 229E NOT DETECTED NOT  DETECTED Final    Comment: (NOTE) The Coronavirus on the Respiratory Panel, DOES NOT test for the novel  Coronavirus (2019 nCoV)    Coronavirus HKU1 NOT DETECTED NOT DETECTED Final   Coronavirus NL63 NOT DETECTED NOT DETECTED Final   Coronavirus OC43 NOT DETECTED NOT DETECTED Final   Metapneumovirus NOT DETECTED NOT DETECTED Final   Rhinovirus / Enterovirus NOT DETECTED NOT DETECTED Final   Influenza A NOT DETECTED NOT DETECTED Final   Influenza B NOT DETECTED NOT DETECTED Final   Parainfluenza Virus 1 NOT DETECTED NOT DETECTED Final   Parainfluenza Virus 2 NOT DETECTED NOT DETECTED Final   Parainfluenza Virus 3 NOT DETECTED NOT DETECTED Final   Parainfluenza Virus 4 NOT DETECTED NOT DETECTED Final   Respiratory Syncytial Virus NOT DETECTED NOT DETECTED Final   Bordetella pertussis NOT DETECTED NOT DETECTED Final   Bordetella Parapertussis NOT DETECTED NOT DETECTED Final   Chlamydophila pneumoniae NOT DETECTED NOT DETECTED Final   Mycoplasma pneumoniae NOT DETECTED NOT DETECTED Final    Comment: Performed at [facility], [address] [place], [place] [address]  SARS Coronavirus 2 by RT PCR (hospital order, performed in [facility] hospital lab) *cepheid single result test* Anterior Nasal Swab     Status: None   Collection Time: 07/10/23  8:03 AM   Specimen: Anterior Nasal Swab  Result Value Ref Range Status   SARS Coronavirus 2 by RT PCR NEGATIVE NEGATIVE Final    Comment: (NOTE) SARS-CoV-2 target nucleic acids are NOT DETECTED.  The SARS-CoV-2 RNA is generally detectable in upper and  lower respiratory specimens during the acute phase of infection. The lowest concentration of SARS-CoV-2 viral copies this assay can detect is 250 copies / mL. A negative result does not preclude SARS-CoV-2 infection and should not be used as the sole basis for treatment or other patient management decisions.  A negative result may occur with improper specimen collection / handling, submission of specimen other than nasopharyngeal swab, presence of viral mutation(s) within the areas targeted by this assay, and inadequate number of viral copies (<250 copies / mL). A negative result must be combined with clinical observations, patient history, and epidemiological information.  Fact Sheet for Patients:   RoadLapTop.co.za  Fact Sheet for Healthcare Providers: http://kim-miller.com/  This test is not yet approved or  cleared by the [place] FDA and has been authorized for detection and/or diagnosis of SARS-CoV-2 by FDA under an Emergency Use Authorization (EUA).  This EUA will remain in effect (meaning this test can be used) for the duration of the COVID-19 declaration under Section 564(b)(1) of the Act, 21 U.S.C. section 360bbb-3(b)(1), unless the authorization is terminated or revoked sooner.  Performed at [facility], [address], [place], [place] [address]      Radiology Studies: No results found.   Pamella Pert, MD, PhD [facility]  Between 7 am - 7 pm I am available, please contact me via Amion (for emergencies) or Securechat (non urgent messages)  Between 7 pm - 7 am I am not available, please contact night coverage MD/APP via Amion

## 2023-07-10 NOTE — Progress Notes (Signed)
   NAME:  Linda [name], MRN:  [id number], DOB:  [birth year], LOS: 4 ADMISSION DATE:  07/06/2023, CONSULTATION DATE: 07/06/2023 REFERRING MD: Dr. Loney Loh, CHIEF COMPLAINT: Respiratory distress  History of Present Illness:  77 yo female with hx of asbestos exposure and pleural plaques, chronic respiratory failure with hypoxia, and vocal cord dysfunction started having trouble with her breathing about 6 weeks ago. She has cough and throat irritation. Gets a rattling in her throat. Gets anxious when she has trouble with her breathing. Nebulizer treatments helps a little.   Pertinent  Medical History  Upper GI bleeding, Anxiety, Vertigo, CAD, HFpEF, Back pain, [name], Diverticulosis, Fibromyalgia, Hiatal hernia, Pneumonia, HTN, Idiopathic urticaria, Insomnia, IDA, Migraine headache, Nephrolithiasis, OSA, Pernicious anemia, PE 2014, Pyelonephritis, RBBB, Depression  Studies:  CT chest 07/06/23 >> heavy bilateral pleural calcifications with associated nodular pleural and subpleural thickening most prominent in Lt lower lobe  Interim History / Subjective:  Wheezing better. Still coughs whenever she tries to talk. Takes her breath away  Objective   Blood pressure 127/79, pulse 62, temperature 98.2 F (36.8 C), temperature source Oral, resp. rate 17, height 5\' 4"  (1.626 m), weight 67.5 kg, SpO2 100%.    FiO2 (%):  [32 %-36 %] 36 %   Intake/Output Summary (Last 24 hours) at 07/10/2023 0847 Last data filed at 07/09/2023 2300 Gross per 24 hour  Intake no documentation  Output 1150 ml  Net -1150 ml   Filed Weights   07/06/23 1500 07/06/23 2145  Weight: 67.6 kg 67.5 kg    Examination: General chronically ill appearing 77 year old female she is sitting in bed no distress but cough is fairly constant HENT NCAT ++upper airway wheezing. Hoarse phonation. Posterior pharynx is erythremic  Pulm diffuse rhonchus cough. No accessory use. Transmitted [facility] wheezing Card rrr Abd soft Ext no sig edema  Neuro  anxious but intact    Assessment & Plan:   Acute to subacute cough, throat irritation and respiratory distress due to VCD vs Laryngopharyngeal reflux disease (LPR)  - most of her symptom burden is 2/2 upper airway irritation  Plan continue nasal saline spray, azelastine, fluticasone nasal spray Will extend the tussionex We need to focus on cough suppression to decrease her irritation  continue protonix bid and pepcid at bedtime Obtaining esophagram. Has had h/o stricture in past. I worry silent aspiration/reflux could be a contributing factor to her on-going symptoms  day 4 of zithromax day 4 of nystatin swish and swallow would benefit from speech therapy training as an outpt Consider ENT eval   Asthma. Plan continue solumedrol 40 mg daily Continue singulair 10 mg at bedtime continue yupelri, brovana, pulmicort flutter valve, mucinex, I am going to stop the HT nebs. Not helping her produce sputum and exacerbating her cough further  prn albuterol   History of obstructive sleep apnea. Plan Reassess as out pt   Asbestos related pleural plaques. - has more prominent pleural thickening Plan will need PET scan as an outpt to assess further and then determine if she needs pleural biopsy   Anxiety, Depression, Chronic pain. -respiratory symptoms get worse when she gets anxious, and difficult to determine which comes first Plan continue anxiety and pain control regimen  Simonne Martinet ACNP-BC [facility] Pulmonary/Critical Care Pager # [phone] OR # [phone] if no answer

## 2023-07-10 NOTE — Progress Notes (Signed)
Results from swallow and esophagram eval reviewed. There was no stricture but unfortunately we were unable to evaluate for reflux.  Plan For now will cont rx as outlined in progress note Will add low dose gabapentin to see if this helps w/ cough  If no improvement may also consider low dose reglan   Simonne Martinet ACNP-BC [facility] Pulmonary/Critical Care Pager # [phone] OR # [phone] if no answer

## 2023-07-10 NOTE — Progress Notes (Signed)
PT Cancellation Note  Patient Details Name: Linda [name] MRN: [id number] DOB: 05-Dec-1946   Cancelled Treatment:     PT order received but eval deferred this pm - pt for MBS.  Will follow.   Roshaun Pound 07/10/2023, 3:07 PM

## 2023-07-11 ENCOUNTER — Inpatient Hospital Stay (HOSPITAL_COMMUNITY): Payer: Medicare Other

## 2023-07-11 DIAGNOSIS — J61 Pneumoconiosis due to asbestos and other mineral fibers: Secondary | ICD-10-CM | POA: Diagnosis not present

## 2023-07-11 DIAGNOSIS — R059 Cough, unspecified: Secondary | ICD-10-CM | POA: Diagnosis not present

## 2023-07-11 DIAGNOSIS — J9601 Acute respiratory failure with hypoxia: Secondary | ICD-10-CM

## 2023-07-11 DIAGNOSIS — R052 Subacute cough: Secondary | ICD-10-CM | POA: Diagnosis not present

## 2023-07-11 DIAGNOSIS — R0602 Shortness of breath: Secondary | ICD-10-CM | POA: Diagnosis not present

## 2023-07-11 LAB — COMPREHENSIVE METABOLIC PANEL
ALT: 17 U/L (ref 0–44)
AST: 19 U/L (ref 15–41)
Albumin: 3.2 g/dL — ABNORMAL LOW (ref 3.5–5.0)
Alkaline Phosphatase: 60 U/L (ref 38–126)
Anion gap: 7 (ref 5–15)
BUN: 22 mg/dL (ref 8–23)
CO2: 30 mmol/L (ref 22–32)
Calcium: 8.5 mg/dL — ABNORMAL LOW (ref 8.9–10.3)
Chloride: 99 mmol/L (ref 98–111)
Creatinine, Ser: 1.01 mg/dL — ABNORMAL HIGH (ref 0.44–1.00)
GFR, Estimated: 57 mL/min — ABNORMAL LOW (ref >60)
Glucose, Bld: 114 mg/dL — ABNORMAL HIGH (ref 70–99)
Potassium: 3.8 mmol/L (ref 3.5–5.1)
Sodium: 136 mmol/L (ref 135–145)
Total Bilirubin: 0.5 mg/dL (ref 0.3–1.2)
Total Protein: 6.8 g/dL (ref 6.5–8.1)

## 2023-07-11 LAB — CBC
HCT: 40.1 % (ref 36.0–46.0)
Hemoglobin: 12.8 g/dL (ref 12.0–15.0)
MCH: 28.5 pg (ref 26.0–34.0)
MCHC: 31.9 g/dL (ref 30.0–36.0)
MCV: 89.3 fL (ref 80.0–100.0)
Platelets: 243 10*3/uL (ref 150–400)
RBC: 4.49 MIL/uL (ref 3.87–5.11)
RDW: 13.2 % (ref 11.5–15.5)
WBC: 11.3 10*3/uL — ABNORMAL HIGH (ref 4.0–10.5)
nRBC: 0 % (ref 0.0–0.2)

## 2023-07-11 LAB — MAGNESIUM: Magnesium: 2.6 mg/dL — ABNORMAL HIGH (ref 1.7–2.4)

## 2023-07-11 MED ORDER — OXYCODONE-ACETAMINOPHEN 5-325 MG PO TABS
1.0000 | ORAL_TABLET | ORAL | Status: DC | PRN
  Administered 2023-07-11 – 2023-07-13 (×11): 1 via ORAL
  Filled 2023-07-11 (×10): qty 1

## 2023-07-11 MED ORDER — OXYCODONE HCL 5 MG PO TABS
5.0000 mg | ORAL_TABLET | ORAL | Status: DC | PRN
  Administered 2023-07-11 – 2023-07-13 (×10): 5 mg via ORAL
  Filled 2023-07-11 (×9): qty 1

## 2023-07-11 NOTE — Progress Notes (Signed)
PROGRESS NOTE  Linda [name] ZOX:[id number] DOB: Aug 01, 1946 DOA: 07/06/2023 PCP: Jeoffrey Massed, MD   LOS: 5 days   Brief Narrative / Interim history: 77 y.o. female with medical history significant of pulmonary asbestosis, chronic hypoxemic respiratory failure on 3 L home oxygen, vocal cord dysfunction, OSA, chronic HFpEF, history of PE, chronic pain, anxiety/depression, nonobstructive CAD, hypertension, idiopathic angioedema-urticaria, neuroleptic induced parkinsonism presented with worsening shortness of breath.  On presentation, she was in severe respiratory distress requiring nonrebreather.  CT chest with contrast showed findings consistent with asbestos related pleural disease or posttraumatic changes.  She was started on IV steroids.  Pulmonary was consulted.   Subjective / 24h Interval events: -feels like her breathing is better this morning  Assesement and Plan: Principal Problem:   Acute on chronic respiratory failure (HCC) Active Problems:   Chronic pain   OSA (obstructive sleep apnea)   Chronic heart failure with preserved ejection fraction (HFpEF) (HCC)   Anxiety   Hypokalemia   CAD-minor 2014   Pulmonary asbestosis (HCC)   Vocal cord dysfunction   Subacute cough   Principal problem Acute on chronic hypoxic respiratory failure, Vocal cord dysfunction with acute flare, Pleural plaques consistent with asbestos lung disease -Normally wears 3 L oxygen via nasal cannula.  Presented in severe respiratory distress requiring nonrebreather.  Respiratory status is improving.  Respiratory status currently stable, back to 3 L -Pulmonary consulted, appreciate input -Continue Solu-Medrol and current nebs.  Currently on Zithromax as well as per pulmonary.   -Continue anxiolytics and pain meds -Esophagogram 7/18 without strictures but could not evaluate for reflux, continue hypertonic saline. Continue to encourage sittin in the chair throughout the day and ambulating  Active  problems OSA -Not on CPAP at home.  Will need outpatient sleep study   Chronic diastolic heart failure Hypertension Hyperlipidemia History of nonobstructive CAD -Compensated.  Echo in February 2023 showed EF of 70 to 75% with grade 1 diastolic dysfunction.  Strict input and output.  Daily weights.  Outpatient follow-up with cardiology.  Continue Imdur, metoprolol succinate -Continue statin -no chest pain   Chronic pain with possible opiate dependence Anxiety/depression -Continue current regimen.  Outpatient follow-up with PCP/pain management   Hypokalemia -replete and continue to monitor   Physical deconditioning -Consult PT  Scheduled Meds:  ALPRAZolam  0.5 mg Oral TID   amLODipine  5 mg Oral Daily   arformoterol  15 mcg Nebulization BID   aspirin EC  81 mg Oral QHS   azelastine  1 spray Each Nare q morning   azithromycin  500 mg Oral Daily   budesonide (PULMICORT) nebulizer solution  0.25 mg Nebulization BID   Chlorhexidine Gluconate Cloth  6 each Topical Daily   chlorpheniramine-HYDROcodone  5 mL Oral Q12H   DULoxetine  30 mg Oral Daily   enoxaparin (LOVENOX) injection  40 mg Subcutaneous Q24H   famotidine  20 mg Oral QHS   fluticasone  1 spray Each Nare QHS   gabapentin  100 mg Oral BID   guaiFENesin  1,200 mg Oral BID   isosorbide mononitrate  60 mg Oral BID   lamoTRIgine  200 mg Oral Daily   methylPREDNISolone (SOLU-MEDROL) injection  40 mg Intravenous Q24H   metoprolol succinate  100 mg Oral Daily   montelukast  10 mg Oral QHS   multivitamin with minerals  1 tablet Oral Daily   nystatin  5 mL Oral QID   mouth rinse  15 mL Mouth Rinse 4 times per day  pantoprazole  40 mg Oral BID AC   revefenacin  175 mcg Nebulization Daily   rosuvastatin  40 mg Oral QPM   sodium chloride  2 spray Each Nare BID   traZODone  100-200 mg Oral QHS   Continuous Infusions: PRN Meds:.hydrALAZINE, ipratropium-albuterol, methocarbamol, ondansetron (ZOFRAN) IV, mouth rinse, mouth rinse,  oxyCODONE-acetaminophen **AND** oxyCODONE  Current Outpatient Medications  Medication Instructions   albuterol (VENTOLIN HFA) 108 (90 Base) MCG/ACT inhaler 1-2 puffs, Inhalation, Every 6 hours PRN   ALPRAZolam (XANAX) 0.5 MG tablet TAKE ONE TABLET BY MOUTH THREE TIMES DAILY   amLODipine (NORVASC) 5 mg, Oral, Daily   aspirin 81 mg, Oral, Daily at bedtime   benzonatate (TESSALON) 100 mg, Oral, 2 times daily PRN   BREZTRI AEROSPHERE 160-9-4.8 MCG/ACT AERO 2 puffs, Inhalation, 2 times daily   buprenorphine (BUTRANS) 5 MCG/HR PTWK 1 patch, Transdermal, Weekly   cyclobenzaprine (FLEXERIL) 5 mg, Oral, 2 times daily PRN   diclofenac Sodium (VOLTAREN) 2 g, Topical, 4 times daily, 4 times a day to both knees   DULoxetine (CYMBALTA) 60 mg, Oral, Daily   EPINEPHrine (EPI-PEN) 0.3 mg, Intramuscular, As needed   ipratropium (ATROVENT) 0.03 % nasal spray 2 sprays, Each Nare, Every 12 hours PRN   ipratropium-albuterol (DUONEB) 0.5-2.5 (3) MG/3ML SOLN 3 mLs, Nebulization, Every 4 hours PRN   isosorbide mononitrate (IMDUR) 60 mg, Oral, 2 times daily   lamoTRIgine (LAMICTAL) 200 mg, Oral, Daily   methocarbamol (ROBAXIN) 500 mg, Oral, Every 8 hours PRN   metoprolol succinate (TOPROL-XL) 100 MG 24 hr tablet take ONE tab daily, take additional 1/2 tab as needed FOR FOR BLOOD PRESSURE > 160/90   metoprolol tartrate (LOPRESSOR) 50 MG tablet Take 2 hours prior to CT scan   Multiple Vitamin (MULTIVITAMIN WITH MINERALS) TABS tablet 1 tablet, Oral, Daily with breakfast   nitroGLYCERIN (NITROSTAT) 0.4 mg, Sublingual, Every 5 min PRN   oxyCODONE-acetaminophen (PERCOCET) 10-325 MG tablet 1 tablet, Oral, Every 6 hours PRN   OXYGEN 3 L/min, Inhalation, Continuous   pantoprazole (PROTONIX) 40 mg, Oral, 2 times daily   rOPINIRole (REQUIP) 0.5 MG tablet TAKE ONE TABLET BY MOUTH AT BEDTIME   rosuvastatin (CRESTOR) 40 mg, Oral, Every evening   solifenacin (VESICARE) 10 MG tablet TAKE ONE TABLET BY MOUTH ONCE DAILY **NEED  OFFICE VISIT** FOR FURTHER REFILLS   traZODone (DESYREL) 50 MG tablet take 2-4 tablets at bedtime as needed for insomnia.    Diet Orders (From admission, onward)     Start     Ordered   07/06/23 2036  Diet Heart Room service appropriate? Yes; Fluid consistency: Thin  Diet effective now       Question Answer Comment  Room service appropriate? Yes   Fluid consistency: Thin      07/06/23 2036            DVT prophylaxis: enoxaparin (LOVENOX) injection 40 mg Start: 07/06/23 2200   Lab Results  Component Value Date   PLT 243 07/11/2023      Code Status: DNR  Family Communication: no family at bedside   Status is: Inpatient Remains inpatient appropriate because: severity of illness  Level of care: Stepdown  Consultants:  PCCM  Objective: Vitals:   07/11/23 0430 07/11/23 0506 07/11/23 0600 07/11/23 0700  BP: 120/65 (!) 112/55 (!) 116/48 (!) 157/64  Pulse: 86 61 (!) 56 (!) 58  Resp: (!) 25 20 16 15   Temp: (!) 97.5 F (36.4 C)     TempSrc: Oral  SpO2: 100% 98% 98% 98%  Weight:      Height:        Intake/Output Summary (Last 24 hours) at 07/11/2023 2952 Last data filed at 07/11/2023 0435 Gross per 24 hour  Intake --  Output 1210 ml  Net -1210 ml   Wt Readings from Last 3 Encounters:  07/06/23 67.5 kg  07/02/23 67.6 kg  06/11/23 67.1 kg    Examination:  Constitutional: NAD Eyes: lids and conjunctivae normal, no scleral icterus ENMT: mmm Neck: normal, supple Respiratory: upper airway wheezing Cardiovascular: Regular rate and rhythm, no murmurs / rubs / gallops. No LE edema. Abdomen: soft, no distention, no tenderness. Bowel sounds positive.   Data Reviewed: I have independently reviewed following labs and imaging studies   CBC Recent Labs  Lab 07/06/23 1324 07/07/23 0301 07/10/23 0326 07/11/23 0335  WBC 7.5 8.9 13.9* 11.3*  HGB 10.7* 12.7 12.1 12.8  HCT 33.6* 40.2 38.0 40.1  PLT 177 213 226 243  MCV 90.1 89.7 90.3 89.3  MCH 28.7 28.3 28.7  28.5  MCHC 31.8 31.6 31.8 31.9  RDW 13.2 13.0 13.1 13.2  LYMPHSABS 1.4  --   --   --   MONOABS 0.7  --   --   --   EOSABS 0.3  --   --   --   BASOSABS 0.0  --   --   --     Recent Labs  Lab 07/06/23 1324 07/07/23 0301 07/10/23 0326  NA 138 138 137  K 3.3* 3.9 3.4*  CL 106 103 102  CO2 23 23 26   GLUCOSE 91 160* 118*  BUN 14 13 20   CREATININE 0.69 0.73 0.81  CALCIUM 8.1* 9.0 8.2*  AST 16  --  17  ALT 11  --  16  ALKPHOS 65  --  59  BILITOT 0.7  --  0.7  ALBUMIN 3.4*  --  3.2*  MG  --  2.3 2.3  BNP 83.4  --   --     ------------------------------------------------------------------------------------------------------------------ No results for input(s): "CHOL", "HDL", "LDLCALC", "TRIG", "CHOLHDL", "LDLDIRECT" in the last 72 hours.  Lab Results  Component Value Date   HGBA1C 5.3 02/27/2022   ------------------------------------------------------------------------------------------------------------------ No results for input(s): "TSH", "T4TOTAL", "T3FREE", "THYROIDAB" in the last 72 hours.  Invalid input(s): "FREET3"  Cardiac Enzymes No results for input(s): "CKMB", "TROPONINI", "MYOGLOBIN" in the last 168 hours.  Invalid input(s): "CK" ------------------------------------------------------------------------------------------------------------------    Component Value Date/Time   BNP 83.4 07/06/2023 1324   BNP 19.9 10/27/2015 1602    CBG: No results for input(s): "GLUCAP" in the last 168 hours.  Recent Results (from the past 240 hour(s))  MRSA Next Gen by PCR, Nasal     Status: None   Collection Time: 07/06/23  9:48 PM   Specimen: Nasal Mucosa; Nasal Swab  Result Value Ref Range Status   MRSA by PCR Next Gen NOT DETECTED NOT DETECTED Final    Comment: (NOTE) The GeneXpert MRSA Assay (FDA approved for NASAL specimens only), is one component of a comprehensive MRSA colonization surveillance program. It is not intended to diagnose MRSA infection nor to  guide or monitor treatment for MRSA infections. Test performance is not FDA approved in patients less than 9 years old. Performed at [facility], [address] [place], [place] [address]   Respiratory (~20 pathogens) panel by PCR     Status: None   Collection Time: 07/10/23  8:03 AM   Specimen: Nasopharyngeal Swab; Respiratory  Result Value Ref Range Status   Adenovirus NOT DETECTED NOT DETECTED Final   Coronavirus 229E NOT DETECTED NOT DETECTED Final    Comment: (NOTE) The Coronavirus on the Respiratory Panel, DOES NOT test for the novel  Coronavirus (2019 nCoV)    Coronavirus HKU1 NOT DETECTED NOT DETECTED Final   Coronavirus NL63 NOT DETECTED NOT DETECTED Final   Coronavirus OC43 NOT DETECTED NOT DETECTED Final   Metapneumovirus NOT DETECTED NOT DETECTED Final   Rhinovirus / Enterovirus NOT DETECTED NOT DETECTED Final   Influenza A NOT DETECTED NOT DETECTED Final   Influenza B NOT DETECTED NOT DETECTED Final   Parainfluenza Virus 1 NOT DETECTED NOT DETECTED Final   Parainfluenza Virus 2 NOT DETECTED NOT DETECTED Final   Parainfluenza Virus 3 NOT DETECTED NOT DETECTED Final   Parainfluenza Virus 4 NOT DETECTED NOT DETECTED Final   Respiratory Syncytial Virus NOT DETECTED NOT DETECTED Final   Bordetella pertussis NOT DETECTED NOT DETECTED Final   Bordetella Parapertussis NOT DETECTED NOT DETECTED Final   Chlamydophila pneumoniae NOT DETECTED NOT DETECTED Final   Mycoplasma pneumoniae NOT DETECTED NOT DETECTED Final    Comment: Performed at [facility], [address] [place], [place] [address]  SARS Coronavirus 2 by RT PCR (hospital order, performed in [facility] hospital lab) *cepheid single result test* Anterior Nasal Swab     Status: None   Collection Time: 07/10/23  8:03 AM   Specimen: Anterior Nasal Swab  Result Value Ref Range Status   SARS Coronavirus 2 by RT PCR NEGATIVE NEGATIVE Final    Comment: (NOTE) SARS-CoV-2 target nucleic acids  are NOT DETECTED.  The SARS-CoV-2 RNA is generally detectable in upper and lower respiratory specimens during the acute phase of infection. The lowest concentration of SARS-CoV-2 viral copies this assay can detect is 250 copies / mL. A negative result does not preclude SARS-CoV-2 infection and should not be used as the sole basis for treatment or other patient management decisions.  A negative result may occur with improper specimen collection / handling, submission of specimen other than nasopharyngeal swab, presence of viral mutation(s) within the areas targeted by this assay, and inadequate number of viral copies (<250 copies / mL). A negative result must be combined with clinical observations, patient history, and epidemiological information.  Fact Sheet for Patients:   RoadLapTop.co.za  Fact Sheet for Healthcare Providers: http://kim-miller.com/  This test is not yet approved or  cleared by the [place] FDA and has been authorized for detection and/or diagnosis of SARS-CoV-2 by FDA under an Emergency Use Authorization (EUA).  This EUA will remain in effect (meaning this test can be used) for the duration of the COVID-19 declaration under Section 564(b)(1) of the Act, 21 U.S.C. section 360bbb-3(b)(1), unless the authorization is terminated or revoked sooner.  Performed at [facility], [address], [place], [place] [address]      Radiology Studies: DG ESOPHAGUS W SINGLE CM (SOL OR THIN BA)  Result Date: 07/10/2023 CLINICAL DATA:  Reflux esophagitis. EXAM: ESOPHOGRAM/BARIUM SWALLOW TECHNIQUE: Single contrast examination was performed using  thin barium. FLUOROSCOPY: Radiation Exposure Index (as provided by the fluoroscopic device): 18.1 mGy Kerma COMPARISON:  Chest CT of 07/06/2023 FINDINGS: Focused, single-contrast exam was necessitated. The patient was actively short of breath and could not be laid on the  fluoroscopic table. Therefore, fluoroscopy was performed with patient sitting in a wheelchair with attempted swallows. Evaluation of esophageal motility was not possible. The patient struggled with multiple  consecutive swallows. Prominent cricopharyngeus is identified on 06/15. No tight esophageal stricture identified. No obstruction of contrast passage. Barium tablet not given. No gross spontaneous gastroesophageal reflux identified. IMPRESSION: Severely limited exam, performed with patient sitting in wheelchair and not on fluoroscopy table. Please see above. No gross esophageal stricture identified. Electronically Signed   By: Jeronimo Greaves M.D.   On: 07/10/2023 16:23   DG Swallowing Func-Speech Pathology  Result Date: 07/10/2023 Table formatting from the original result was not included. Modified Barium Swallow Study Patient Details Name: Linda [name] MRN: [id number] Date of Birth: 12/03/1946 Today's Date: 07/10/2023 HPI/PMH: HPI: Per hospitalist note "77 y.o. female with medical history significant of pulmonary asbestosis, chronic hypoxemic respiratory failure on 3 L home oxygen, vocal cord dysfunction, OSA, chronic HFpEF, history of PE, chronic pain, anxiety/depression, nonobstructive CAD, hypertension, idiopathic angioedema-urticaria, neuroleptic induced parkinsonism presented with worsening shortness of breath.  On presentation, she was in severe respiratory distress requiring nonrebreather.  CT chest with contrast showed findings consistent with asbestos related pleural disease or posttraumatic changes.  She was started on IV steroids.  Pulmonary was consulted."  Pulmonary ordered esophagram due to pt's extensive esophageal history including esophageal stricture.  Radiologist deemed SLP evaluation needed first to assure pt could swallow lying down - therefore CCM ordered MBS - and esophagram.  Imaging of neck showed  Chronic degenerative ankylosis of the C5 and C6 vertebral bodies.  CT chest "Heavy  bilateral pleural calcifications with associated linear and  nodular pleural and subpleural thickening. Most prominent nodular  thickening is seen in the left lower lobe. Findings are consistent  with asbestos related pleural disease or posttraumatic changes."  Old lacunar  infarction in the right caudate head seen on prior MRI.  RN reports pt coughing frequently but cannot differentiate if coughing could be related to aspiration during or after po. Clinical Impression: Clinical Impression: Minimal barium provided to pt for MBS to assure pt able to adequately protect her airway with liquid consumption given esophagram ordered and concern for aspiration.  SlP tested pt reclined with liquids between nectar and thin consistency - with pt demonstrating adequate airway protection.  Pt was tested with head of chair reclined to approx 45* to emulate esophagram with adequate airway protection observed.  Also tested with thin via tsp,cup, thin/nectar mixture via straw and nectar liquids tsp, cup, straw- Trace upper laryngeal penetration of thin cleared with further swallow.  Pharyngeal swallow is strong without retention.   She does appear with significantly prominent cricopharyngeus.  Spoke to radiologist regarding pt's findings. Factors that may increase risk of adverse event in presence of aspiration [name] & Clearance Coots 2021): No data recorded Recommendations/Plan: Swallowing Evaluation Recommendations Swallowing Evaluation Recommendations Recommendations: -- (thin liquids and defer to MD for solid food recommendation) Liquid Administration via: Cup; Straw Medication Administration: Other (Comment) Supervision: Patient able to self-feed Swallowing strategies  : Slow rate; Small bites/sips Postural changes: Position pt fully upright for meals Oral care recommendations: Oral care BID (2x/day) Treatment Plan Treatment Plan Treatment recommendations: No treatment recommended at this time (evaluation was to rule out silent  aspiration) Follow-up recommendations: No SLP follow up Functional status assessment: Patient has not had a recent decline in their functional status. Recommendations Recommendations for follow up therapy are one component of a multi-disciplinary discharge planning process, led by the attending physician.  Recommendations may be updated based on patient status, additional functional criteria and insurance authorization. Assessment: Orofacial Exam: Orofacial Exam Oral Cavity: Oral Hygiene: WFL Oral Cavity -  Dentition: Dentures, top; Other (Comment) (lower dentition present) Orofacial Anatomy: WFL Oral Motor/Sensory Function: Other (comment) Anatomy: Anatomy: Other (Comment) (pt with chronic degenerative ankylosis per report) Boluses Administered: Boluses Administered Boluses Administered: Thin liquids (Level 0); Mildly thick liquids (Level 2, nectar thick)  Oral Impairment Domain: Oral Impairment Domain Lip Closure: No labial escape Tongue control during bolus hold: Cohesive bolus between tongue to palatal seal Bolus transport/lingual motion: Brisk tongue motion Oral residue: Trace residue lining oral structures Initiation of pharyngeal swallow : Pyriform sinuses  Pharyngeal Impairment Domain: Pharyngeal Impairment Domain Soft palate elevation: No bolus between soft palate (SP)/pharyngeal wall (PW) Laryngeal elevation: Partial superior movement of thyroid cartilage/partial approximation of arytenoids to epiglottic petiole Anterior hyoid excursion: Partial anterior movement Epiglottic movement: Complete inversion Laryngeal vestibule closure: Incomplete, narrow column air/contrast in laryngeal vestibule Pharyngeal stripping wave : Present - complete Pharyngeal contraction (A/P view only): N/A Pharyngoesophageal segment opening: Partial distention/partial duration, partial obstruction of flow Tongue base retraction: No contrast between tongue base and posterior pharyngeal wall (PPW) Pharyngeal residue: Complete  pharyngeal clearance  Esophageal Impairment Domain: Esophageal Impairment Domain Esophageal clearance upright position: -- (DNT) Pill: Pill Consistency administered: -- (DNT) Penetration/Aspiration Scale Score: Penetration/Aspiration Scale Score 1.  Material does not enter airway: Mildly thick liquids (Level 2, nectar thick) 2.  Material enters airway, remains ABOVE vocal cords then ejected out: Thin liquids (Level 0) Compensatory Strategies: Compensatory Strategies Compensatory strategies: No   General Information: Caregiver present: No  Diet Prior to this Study: Regular; Thin liquids (Level 0)   Temperature : Normal   Respiratory Status: Tachypneic   Supplemental O2: High flow nasal cannula (six liters)   History of Recent Intubation: No  Behavior/Cognition: Alert; Cooperative; Pleasant mood Self-Feeding Abilities: Able to self-feed Baseline vocal quality/speech: Dysphonic Volitional Cough: Able to elicit Volitional Swallow: Able to elicit Exam Limitations: No limitations Goal Planning: No data recorded No data recorded No data recorded No data recorded Consulted and agree with results and recommendations: Patient; Other (comment); Physician (radiologist) Pain: Pain Assessment Pain Assessment: No/denies pain (no "throat discomfort") End of Session: Start Time:SLP Start Time (ACUTE ONLY): 1505 Stop Time: SLP Stop Time (ACUTE ONLY): 1515 Time Calculation:SLP Time Calculation (min) (ACUTE ONLY): 10 min Charges: SLP Evaluations $ SLP Speech Visit: 1 Visit SLP Evaluations $MBS Swallow: 1 Procedure SLP visit diagnosis: SLP Visit Diagnosis: Dysphagia, unspecified (R13.10) Past Medical History: Past Medical History: Diagnosis Date  Acute upper GI bleed 06/2014  while pt taking coumadin, plavix, and meloxicam---despite being told not to take coumadin.  Anginal pain (HCC)   Nonobstructive CAD 2014; however, her cardiologist put her on a statin for this and NOT for hyperlipidemia per pt report.  Atyp CP 08/2017 at card f/u,  plan for myoc perf imaging.  Anxiety   panic attacks  BPPV (benign paroxysmal positional vertigo) 12/16/2012  CAD (coronary artery disease)   Nonobstructive ([place] [facility])  Chronic diastolic CHF (congestive heart failure) (HCC)   dry wt as of 11/06/16 is 168 lbs.  Chronic lower back pain   DDD (degenerative disc disease)   lumbar and cervical.   Diverticular disease   Fibromyalgia   Patient states dx was around her late 26s but she had sx's for years prior to this.  H/O hiatal hernia   History of pneumonia   hospitalized 12/2011, 02/2013, and 07/2013 [facility]) for this  HTN (hypertension)   Renal artery dopplers 04/2013 neg for stenosis.  Hypervitaminosis D 09/27/2019  over-supplemented.  Stopped vit D and plan recheck 2  mo.  Idiopathic angio-edema-urticaria 40981; 2021  Angioedema component was very minimal.  2021->Dr. Bobbitt (allergist) eval.  Insomnia   Iron deficiency anemia   Hematologist in [place], [place] did extensive w/u; no cause found; failed oral supplement;; gets fairly regular (q35m or so) IV iron infusions (Venofer -iron sucrose- 200mg  with procrit.  "for 14 yr I've been getting blood work q month & getting infusions prn" (07/12/2013).  Dr. Myna Hidalgo locally, iron infusions done, EPO deficiency dx'd  Migraine syndrome   "not as often anymore; used to be ~ q wk" (07/12/2013)  Mixed incontinence urge and stress   Nephrolithiasis   "passed all on my own or they are still in there" (07/12/2013)  Neuroleptic induced parkinsonism (HCC) 2018  Dr. Arbutus Leas, neuro, saw her 11/24/17 and recommended d/c of abilify as first step.  D/c'd abilify and pt got complete recovery.  Oropharyngeal dysphagia   swallowing study speech path 05/2020. Gastric bx's showed gastritis, h pylori NEG  OSA on CPAP   prior to move to [place]--had another sleep study 10/2015 w/pulm Dr. Su Monks.  Osteoarthritis   "severe; progressing fast" (07/12/2013); multiple joints-not surgical candidate for TKR (03/2015).  Triamcinolon knee injections by Dr. Hermelinda Medicus 12/2017.   Pernicious anemia 08/24/2014  Pleural plaque with presence of asbestos 07/22/2013  Pulmonary embolism (HCC) 07/2013  [facility]x at [facility] with very small peripheral upper lobe pe 07/2013: pt took coumadin for about 8-9 mo  Pyelonephritis   "several times over the last 30 yr" (07/12/2013)  RBBB (right bundle branch block)   Recurrent major depression (HCC)   Recurrent UTI   hx of hospitalization for pyelonephritis; started abx prophylaxis 06/2015  Syncope   Hypotensive; ED visit--Dr. Sharyn Lull did Cath--nonobstructive CAD, EF 55-60%.  In retrospect, suspect pt rx med misuse/polypharmacy  Vertebral compression fracture (HCC)   Acute T12 on 02/27/22 (fall).  multiple old thoracic-->neurosurg to do MRI as of 05/2022 Past Surgical History: Past Surgical History: Procedure Laterality Date  APPENDECTOMY  1960  AXILLARY SURGERY Left 1978  Multiple "lump" in armpit per pt  BIOPSY  06/17/2020  Gastric bx->gastritis, h pylori neg.  Procedure: BIOPSY;  Surgeon: Jeani Hawking, MD;  Location: [facility];  Service: Endoscopy;;  CARDIAC CATHETERIZATION  01/2013  nonobstructive CAD, EF 55-60%  CARDIOVASCULAR STRESS TEST  02/22/2015  Low risk myocard perf imaging; wall motion normal, normal EF  CARDIOVASCULAR STRESS TEST    09/2017 myo perf low risk  carotid duplex doppler  10/21/2017  01/2023 1-30% bilat-->rpt 1 yr  COCCYX REMOVAL  1972  CT CTA CORONARY W/CA SCORE W/CM &/OR WO/CM    04/02/23 96%'tile calcium score, nonobst CAD-->crestor increased to 40  DEXA  06/05/2017  T-score -3.1  DILATION AND CURETTAGE OF UTERUS  ? 1970's  ESOPHAGOGASTRODUODENOSCOPY N/A 07/19/2014  Gastritis found + in the setting of supratherapeutic INR, +plavix, + meloxicam.  ESOPHAGOGASTRODUODENOSCOPY (EGD) WITH PROPOFOL N/A 06/17/2020  NO stricture or other prob to explain pt's dysphagia, dilation was done anyway.  Gastric bx's-->gastritis, h pylori neg. Procedure: ESOPHAGOGASTRODUODENOSCOPY (EGD) WITH PROPOFOL;  Surgeon: Jeani Hawking, MD;  Location: [facility];  Service:  Endoscopy;  Laterality: N/A;  EYE SURGERY Left 2012-2013  "injections for ~ 1 yr; don't really know what for" (07/12/2013)  HEEL SPUR SURGERY Left 2008  kidney stone removal Right   KNEE SURGERY  2005  LEFT HEART CATHETERIZATION WITH CORONARY ANGIOGRAM N/A 01/30/2013  Procedure: LEFT HEART CATHETERIZATION WITH CORONARY ANGIOGRAM;  Surgeon: Robynn Pane, MD;  Location: [facility];  Service: Cardiovascular;  Laterality:  N/A;  MALONEY DILATION  06/17/2020  Procedure: Elease Hashimoto DILATION;  Surgeon: Jeani Hawking, MD;  Location: [facility];  Service: Endoscopy;;  PLANTAR FASCIA RELEASE Left 2008  SPIROMETRY  04/25/2014  In hosp for acute asthma/COPD flare: mixed obstructive and restrictive lung disease. The FEV1 is severely reduced at 45% predicted.  FEV1 signif decreased compared to prior spirometry 07/23/13.  TENDON RELEASE  1996  Right forearm and hand  TOTAL ABDOMINAL HYSTERECTOMY  1974  TRANSTHORACIC ECHOCARDIOGRAM  01/2013; 04/2014;08/2015; 09/2017  2014--NORMAL.  2015--focal basal septal hypertrophy, EF 55-60%, grade I diast dysfxn, mild LAE.  08/2015 EF 55-60%, nl LV syst fxn, grade I DD, valves wnl. 10/21/17: EF 65-70%, grd I DD, o/w normal. 02/17/22 EF 70-75%, hyperdynamic LV fxn, grd I DD. Rolena Infante, MS [facility] SLP Acute Rehab Services Office [phone] Chales Abrahams 07/10/2023, 4:09 PM    Pamella Pert, MD, PhD [facility]  Between 7 am - 7 pm I am available, please contact me via Amion (for emergencies) or Securechat (non urgent messages)  Between 7 pm - 7 am I am not available, please contact night coverage MD/APP via Amion

## 2023-07-11 NOTE — Procedures (Signed)
Preop diagnosis: Shortness of breath and cough Postop diagnosis: same Procedure: Transnasal fiberoptic laryngoscopy Surgeon: Jenne Pane Anesth: Topical with 4% lidocaine Compl: None Findings: Normal right nasal passage, nasopharynx, pharynx, and larynx.  Normally mobile vocal folds with good closure.  No mass. Description:  After discussing risks, benefits, and alternatives, the patient was placed in a seated position and the right nasal passage was sprayed with topical anesthetic.  The fiberoptic scope was passed through the right nasal passage to view the pharynx and larynx.  Findings are noted above.  The scope was then removed and he was returned to nursing care in stable condition.

## 2023-07-11 NOTE — Evaluation (Signed)
Physical Therapy Evaluation Patient Details Name: Linda [name] MRN: [id number] DOB: 03/27/1946 Today's Date: 07/11/2023  History of Present Illness  Pt admitted from [name]* SOB and dx with acute on chronic respiratory failure and vocal cord dysfunction.  Pt with hx of BPPV, CHF, DDD, Fibromyalgia, RBBB, syncope, pulmonary asbestosis, and T12 compression fx  Clinical Impression  Pt admitted as above and presenting with functional mobility limitations 2* limited endurance associated with respiratory failure.  This date, pt demonstrates strength/ROM [facility] all fours but reports bil knees will be needing injections soon or will be less functional.  Pt declines to attempt OOB 2* fatigue and states up with nursing and extremely dizzy - states did not feel like prior BPPV .  With improvement in breathing, pt hopes to return to [facility] and return to [facility] facility.  If pt is slow to progress, patient will benefit from continued inpatient follow up therapy, <3 hours/day       Assistance Recommended at Discharge Intermittent Supervision/Assistance  If plan is discharge home, recommend the following:  Can travel by private vehicle  A lot of help with walking and/or transfers;A little help with bathing/dressing/bathroom;Assistance with cooking/housework;Help with stairs or ramp for entrance;Assist for transportation        Equipment Recommendations None recommended by PT  Recommendations for Other Services  OT consult    Functional Status Assessment Patient has had a recent decline in their functional status and demonstrates the ability to make significant improvements in function in a reasonable and predictable amount of time.     Precautions / Restrictions Precautions Precautions: Fall Restrictions Weight Bearing Restrictions: No      Mobility  Bed Mobility               General bed mobility comments: Pt declines OOB at this time    Transfers                         Ambulation/Gait                  Stairs            Wheelchair Mobility     Tilt Bed    Modified Rankin (Stroke Patients Only)       Balance                                             Pertinent Vitals/Pain Pain Assessment Pain Assessment: No/denies pain    Home Living Family/patient expects to be discharged to:: Other (Comment)                   Additional Comments: [facility] living facility    Prior Function Prior Level of Function : Independent/Modified Independent             Mobility Comments: uses rollator for distance       Hand Dominance   Dominant Hand: Right    Extremity/Trunk Assessment   Upper Extremity Assessment Upper Extremity Assessment: Overall WFL for tasks assessed    Lower Extremity Assessment Lower Extremity Assessment: Overall WFL for tasks assessed       Communication   Communication: No difficulties  Cognition Arousal/Alertness: Awake/alert Behavior During Therapy: WFL for tasks assessed/performed Overall Cognitive Status: Within Functional Limits for tasks assessed  General Comments      Exercises     Assessment/Plan    PT Assessment Patient needs continued PT services  PT Problem List Decreased strength;Decreased range of motion;Decreased activity tolerance;Decreased balance;Decreased mobility;Decreased knowledge of use of DME       PT Treatment Interventions DME instruction;Gait training;Functional mobility training;Therapeutic activities;Therapeutic exercise;Patient/family education    PT Goals (Current goals can be found in the Care Plan section)  Acute Rehab PT Goals Patient Stated Goal: Regain IND and return to [facility] living facility PT Goal Formulation: With patient Time For Goal Achievement: 07/24/23 Potential to Achieve Goals: Fair    Frequency Min 1X/week     Co-evaluation                AM-PAC PT "6 Clicks" Mobility  Outcome Measure Help needed turning from your back to your side while in a flat bed without using bedrails?: A Little Help needed moving from lying on your back to sitting on the side of a flat bed without using bedrails?: A Lot Help needed moving to and from a bed to a chair (including a wheelchair)?: A Lot Help needed standing up from a chair using your arms (e.g., wheelchair or bedside chair)?: A Lot Help needed to walk in hospital room?: Total Help needed climbing 3-5 steps with a railing? : Total 6 Click Score: 11    End of Session   Activity Tolerance: Patient limited by fatigue Patient left: in bed;with call bell/phone within reach;with bed alarm set Nurse Communication: Mobility status PT Visit Diagnosis: Difficulty in walking, not elsewhere classified (R26.2)    Time: 2841-3244 PT Time Calculation (min) (ACUTE ONLY): 20 min   Charges:   PT Evaluation $PT Eval Low Complexity: 1 Low   PT General Charges $$ ACUTE PT VISIT: 1 Visit         Mauro Kaufmann PT Acute Rehabilitation Services Pager [phone] Office [phone]   Mansfield Dann 07/11/2023, 4:11 PM

## 2023-07-11 NOTE — Consult Note (Signed)
Reason for Consult: Shortness of breath and cough Referring Physician: CCM  Linda [name] is an 77 y.o. female.  HPI: 77 year old female with several weeks of cough and shortness of breath treated with antibiotics, steroids, inhalers, and other treatments but required hospitalization due to severity.  Evaluation has not demonstrated a clear source of her symptoms.  She has a history of asbestosis with pleural plaques.  Chest CT demonstrated findings consistent with this problem.  Past Medical History:  Diagnosis Date   Acute upper GI bleed 06/2014   while pt taking coumadin, plavix, and meloxicam---despite being told not to take coumadin.   Anginal pain (HCC)    Nonobstructive CAD 2014; however, her cardiologist put her on a statin for this and NOT for hyperlipidemia per pt report.  Atyp CP 08/2017 at card f/u, plan for myoc perf imaging.   Anxiety    panic attacks   BPPV (benign paroxysmal positional vertigo) 12/16/2012   CAD (coronary artery disease)    Nonobstructive ([facility])   Chronic diastolic CHF (congestive heart failure) (HCC)    dry wt as of 11/06/16 is 168 lbs.   Chronic lower back pain    DDD (degenerative disc disease)    lumbar and cervical.    Diverticular disease    Fibromyalgia    Patient states dx was around her late 18s but she had sx's for years prior to this.   H/O hiatal hernia    History of pneumonia    hospitalized 12/2011, 02/2013, and 07/2013 [facility]) for this   HTN (hypertension)    Renal artery dopplers 04/2013 neg for stenosis.   Hypervitaminosis D 09/27/2019   over-supplemented.  Stopped vit D and plan recheck 2 mo.   Idiopathic angio-edema-urticaria 09604; 2021   Angioedema component was very minimal.  2021->Dr. Bobbitt (allergist) eval.   Insomnia    Iron deficiency anemia    Hematologist in [place], [place] did extensive w/u; no cause found; failed oral supplement;; gets fairly regular (q23m or so) IV iron infusions (Venofer -iron sucrose- 200mg  with  procrit.  "for 14 yr I've been getting blood work q month & getting infusions prn" (07/12/2013).  Dr. Myna Hidalgo locally, iron infusions done, EPO deficiency dx'd   Migraine syndrome    "not as often anymore; used to be ~ q wk" (07/12/2013)   Mixed incontinence urge and stress    Nephrolithiasis    "passed all on my own or they are still in there" (07/12/2013)   Neuroleptic induced parkinsonism (HCC) 2018   Dr. Arbutus Leas, neuro, saw her 11/24/17 and recommended d/c of abilify as first step.  D/c'd abilify and pt got complete recovery.   Oropharyngeal dysphagia    swallowing study speech path 05/2020. Gastric bx's showed gastritis, h pylori NEG   OSA on CPAP    prior to move to [place]--had another sleep study 10/2015 w/pulm Dr. Su Monks.   Osteoarthritis    "severe; progressing fast" (07/12/2013); multiple joints-not surgical candidate for TKR (03/2015).  Triamcinolon knee injections by Dr. Hermelinda Medicus 12/2017.   Pernicious anemia 08/24/2014   Pleural plaque with presence of asbestos 07/22/2013   Pulmonary embolism (HCC) 07/2013   Dx at [facility] with very small peripheral upper lobe pe 07/2013: pt took coumadin for about 8-9 mo   Pyelonephritis    "several times over the last 30 yr" (07/12/2013)   RBBB (right bundle branch block)    Recurrent major depression (HCC)    Recurrent UTI    hx of hospitalization  for pyelonephritis; started abx prophylaxis 06/2015   Syncope    Hypotensive; ED visit--Dr. Sharyn Lull did Cath--nonobstructive CAD, EF 55-60%.  In retrospect, suspect pt rx med misuse/polypharmacy   Vertebral compression fracture (HCC)    Acute T12 on 02/27/22 (fall).  multiple old thoracic-->neurosurg to do MRI as of 05/2022    Past Surgical History:  Procedure Laterality Date   APPENDECTOMY  1960   AXILLARY SURGERY Left 1978   Multiple "lump" in armpit per pt   BIOPSY  06/17/2020   Gastric bx->gastritis, h pylori neg.  Procedure: BIOPSY;  Surgeon: Jeani Hawking, MD;  Location: [facility];  Service: Endoscopy;;    CARDIAC CATHETERIZATION  01/2013   nonobstructive CAD, EF 55-60%   CARDIOVASCULAR STRESS TEST  02/22/2015   Low risk myocard perf imaging; wall motion normal, normal EF   CARDIOVASCULAR STRESS TEST     09/2017 myo perf low risk   carotid duplex doppler  10/21/2017   01/2023 1-30% bilat-->rpt 1 yr   COCCYX REMOVAL  1972   CT CTA CORONARY W/CA SCORE W/CM &/OR WO/CM     04/02/23 96%'tile calcium score, nonobst CAD-->crestor increased to 40   DEXA  06/05/2017   T-score -3.1   DILATION AND CURETTAGE OF UTERUS  ? 1970's   ESOPHAGOGASTRODUODENOSCOPY N/A 07/19/2014   Gastritis found + in the setting of supratherapeutic INR, +plavix, + meloxicam.   ESOPHAGOGASTRODUODENOSCOPY (EGD) WITH PROPOFOL N/A 06/17/2020   NO stricture or other prob to explain pt's dysphagia, dilation was done anyway.  Gastric bx's-->gastritis, h pylori neg. Procedure: ESOPHAGOGASTRODUODENOSCOPY (EGD) WITH PROPOFOL;  Surgeon: Jeani Hawking, MD;  Location: [facility];  Service: Endoscopy;  Laterality: N/A;   EYE SURGERY Left 2012-2013   "injections for ~ 1 yr; don't really know what for" (07/12/2013)   HEEL SPUR SURGERY Left 2008   kidney stone removal Right    KNEE SURGERY  2005   LEFT HEART CATHETERIZATION WITH CORONARY ANGIOGRAM N/A 01/30/2013   Procedure: LEFT HEART CATHETERIZATION WITH CORONARY ANGIOGRAM;  Surgeon: Robynn Pane, MD;  Location: [facility];  Service: Cardiovascular;  Laterality: N/A;   MALONEY DILATION  06/17/2020   Procedure: Elease Hashimoto DILATION;  Surgeon: Jeani Hawking, MD;  Location: [facility];  Service: Endoscopy;;   PLANTAR FASCIA RELEASE Left 2008   SPIROMETRY  04/25/2014   In hosp for acute asthma/COPD flare: mixed obstructive and restrictive lung disease. The FEV1 is severely reduced at 45% predicted.  FEV1 signif decreased compared to prior spirometry 07/23/13.   TENDON RELEASE  1996   Right forearm and hand   TOTAL ABDOMINAL HYSTERECTOMY  1974   TRANSTHORACIC ECHOCARDIOGRAM  01/2013;  04/2014;08/2015; 09/2017   2014--NORMAL.  2015--focal basal septal hypertrophy, EF 55-60%, grade I diast dysfxn, mild LAE.  08/2015 EF 55-60%, nl LV syst fxn, grade I DD, valves wnl. 10/21/17: EF 65-70%, grd I DD, o/w normal. 02/17/22 EF 70-75%, hyperdynamic LV fxn, grd I DD.    Family History  Problem Relation Age of Onset   Arthritis Mother    Kidney disease Mother    Heart disease Father    Stroke Father    Hypertension Father    Diabetes Father    Heart attack Father    Heart attack Paternal Grandmother    Diabetes Sister        one sister   Hypertension Sister    Asthma Sister    Hypertension Brother    Asthma Brother    Asthma Daughter    Multiple sclerosis Son  Social History:  reports that she has never smoked. She has never used smokeless tobacco. She reports that she does not drink alcohol and does not use drugs.  Allergies:  Allergies  Allergen Reactions   Aripiprazole Rash and Other (See Comments)    Parkinsonism, also   Penicillins Shortness Of Breath, Itching, Swelling, Rash and Other (See Comments)    Tolerated Cefepime in ED   Bactrim [Sulfamethoxazole-Trimethoprim] Rash    Medications: I have reviewed the patient's current medications.  Results for orders placed or performed during the hospital encounter of 07/06/23 (from the past 48 hour(s))  Comprehensive metabolic panel     Status: Abnormal   Collection Time: 07/10/23  3:26 AM  Result Value Ref Range   Sodium 137 135 - 145 mmol/L   Potassium 3.4 (L) 3.5 - 5.1 mmol/L   Chloride 102 98 - 111 mmol/L   CO2 26 22 - 32 mmol/L   Glucose, Bld 118 (H) 70 - 99 mg/dL    Comment: Glucose reference range applies only to samples taken after fasting for at least 8 hours.   BUN 20 8 - 23 mg/dL   Creatinine, Ser 4.09 0.44 - 1.00 mg/dL   Calcium 8.2 (L) 8.9 - 10.3 mg/dL   Total Protein 6.4 (L) 6.5 - 8.1 g/dL   Albumin 3.2 (L) 3.5 - 5.0 g/dL   AST 17 15 - 41 U/L   ALT 16 0 - 44 U/L   Alkaline Phosphatase 59 38 -  126 U/L   Total Bilirubin 0.7 0.3 - 1.2 mg/dL   GFR, Estimated >81 >19 mL/min    Comment: (NOTE) Calculated using the CKD-EPI Creatinine Equation (2021)    Anion gap 9 5 - 15    Comment: Performed at [facility], [address], [place], [place] [address]  CBC     Status: Abnormal   Collection Time: 07/10/23  3:26 AM  Result Value Ref Range   WBC 13.9 (H) 4.0 - 10.5 K/uL   RBC 4.21 3.87 - 5.11 MIL/uL   Hemoglobin 12.1 12.0 - 15.0 g/dL   HCT 95.6 21.3 - 08.6 %   MCV 90.3 80.0 - 100.0 fL   MCH 28.7 26.0 - 34.0 pg   MCHC 31.8 30.0 - 36.0 g/dL   RDW 57.8 46.9 - 62.9 %   Platelets 226 150 - 400 K/uL   nRBC 0.0 0.0 - 0.2 %    Comment: Performed at [facility], [address], [place], [place] [address]  Magnesium     Status: None   Collection Time: 07/10/23  3:26 AM  Result Value Ref Range   Magnesium 2.3 1.7 - 2.4 mg/dL    Comment: Performed at [facility], [address], [place], [place] [address]  Respiratory (~20 pathogens) panel by PCR     Status: None   Collection Time: 07/10/23  8:03 AM   Specimen: Nasopharyngeal Swab; Respiratory  Result Value Ref Range   Adenovirus NOT DETECTED NOT DETECTED   Coronavirus 229E NOT DETECTED NOT DETECTED    Comment: (NOTE) The Coronavirus on the Respiratory Panel, DOES NOT test for the novel  Coronavirus (2019 nCoV)    Coronavirus HKU1 NOT DETECTED NOT DETECTED   Coronavirus NL63 NOT DETECTED NOT DETECTED   Coronavirus OC43 NOT DETECTED NOT DETECTED   Metapneumovirus NOT DETECTED NOT DETECTED   Rhinovirus / Enterovirus NOT DETECTED NOT DETECTED   Influenza A NOT DETECTED NOT DETECTED   Influenza B NOT DETECTED  NOT DETECTED   Parainfluenza Virus 1 NOT DETECTED NOT DETECTED   Parainfluenza Virus 2 NOT DETECTED NOT DETECTED   Parainfluenza Virus 3 NOT DETECTED NOT DETECTED   Parainfluenza Virus 4 NOT DETECTED NOT DETECTED   Respiratory Syncytial Virus NOT DETECTED NOT DETECTED    Bordetella pertussis NOT DETECTED NOT DETECTED   Bordetella Parapertussis NOT DETECTED NOT DETECTED   Chlamydophila pneumoniae NOT DETECTED NOT DETECTED   Mycoplasma pneumoniae NOT DETECTED NOT DETECTED    Comment: Performed at [facility], [address], [place], [place] [address]  SARS Coronavirus 2 by RT PCR (hospital order, performed in [facility] hospital lab) *cepheid single result test* Anterior Nasal Swab     Status: None   Collection Time: 07/10/23  8:03 AM   Specimen: Anterior Nasal Swab  Result Value Ref Range   SARS Coronavirus 2 by RT PCR NEGATIVE NEGATIVE    Comment: (NOTE) SARS-CoV-2 target nucleic acids are NOT DETECTED.  The SARS-CoV-2 RNA is generally detectable in upper and lower respiratory specimens during the acute phase of infection. The lowest concentration of SARS-CoV-2 viral copies this assay can detect is 250 copies / mL. A negative result does not preclude SARS-CoV-2 infection and should not be used as the sole basis for treatment or other patient management decisions.  A negative result may occur with improper specimen collection / handling, submission of specimen other than nasopharyngeal swab, presence of viral mutation(s) within the areas targeted by this assay, and inadequate number of viral copies (<250 copies / mL). A negative result must be combined with clinical observations, patient history, and epidemiological information.  Fact Sheet for Patients:   RoadLapTop.co.za  Fact Sheet for Healthcare Providers: http://kim-miller.com/  This test is not yet approved or  cleared by the [place] FDA and has been authorized for detection and/or diagnosis of SARS-CoV-2 by FDA under an Emergency Use Authorization (EUA).  This EUA will remain in effect (meaning this test can be used) for the duration of the COVID-19 declaration under Section 564(b)(1) of the Act, 21 U.S.C. section 360bbb-3(b)(1),  unless the authorization is terminated or revoked sooner.  Performed at [facility], [address], [place], [place] [address]   Comprehensive metabolic panel     Status: Abnormal   Collection Time: 07/11/23  3:35 AM  Result Value Ref Range   Sodium 136 135 - 145 mmol/L   Potassium 3.8 3.5 - 5.1 mmol/L   Chloride 99 98 - 111 mmol/L   CO2 30 22 - 32 mmol/L   Glucose, Bld 114 (H) 70 - 99 mg/dL    Comment: Glucose reference range applies only to samples taken after fasting for at least 8 hours.   BUN 22 8 - 23 mg/dL   Creatinine, Ser 0.98 (H) 0.44 - 1.00 mg/dL   Calcium 8.5 (L) 8.9 - 10.3 mg/dL   Total Protein 6.8 6.5 - 8.1 g/dL   Albumin 3.2 (L) 3.5 - 5.0 g/dL   AST 19 15 - 41 U/L   ALT 17 0 - 44 U/L   Alkaline Phosphatase 60 38 - 126 U/L   Total Bilirubin 0.5 0.3 - 1.2 mg/dL   GFR, Estimated 57 (L) >60 mL/min    Comment: (NOTE) Calculated using the CKD-EPI Creatinine Equation (2021)    Anion gap 7 5 - 15    Comment: Performed at [facility], [address], [place], [place] [address]  CBC     Status: Abnormal  Collection Time: 07/11/23  3:35 AM  Result Value Ref Range   WBC 11.3 (H) 4.0 - 10.5 K/uL   RBC 4.49 3.87 - 5.11 MIL/uL   Hemoglobin 12.8 12.0 - 15.0 g/dL   HCT 16.1 09.6 - 04.5 %   MCV 89.3 80.0 - 100.0 fL   MCH 28.5 26.0 - 34.0 pg   MCHC 31.9 30.0 - 36.0 g/dL   RDW 40.9 81.1 - 91.4 %   Platelets 243 150 - 400 K/uL   nRBC 0.0 0.0 - 0.2 %    Comment: Performed at [facility], [address] [place], [place] [address]  Magnesium     Status: Abnormal   Collection Time: 07/11/23  3:35 AM  Result Value Ref Range   Magnesium 2.6 (H) 1.7 - 2.4 mg/dL    Comment: Performed at [facility], [address], [place], [place] [address]   *Note: Due to a large number of results and/or encounters for the requested time period, some results have not been displayed. A complete set of results can  be found in Results Review.    DG CHEST PORT 1 VIEW  Result Date: 07/11/2023 CLINICAL DATA:  Respiratory failure EXAM: PORTABLE CHEST 1 VIEW COMPARISON:  Previous studies including chest radiograph done on 07/06/2023 and CT done on 07/06/2023 FINDINGS: Transverse diameter of heart is increased. There are no signs of pulmonary edema or focal pulmonary consolidation. Extensive coarse pleural calcifications are noted on both sides. Please correlate for clinical history of asbestos exposure. There are no new infiltrates or signs of pulmonary edema. There is no pleural effusion or pneumothorax. IMPRESSION: There are no signs of pulmonary edema or new focal infiltrates. Extensive pleural calcifications are noted on both sides. No significant interval changes are noted. Electronically Signed   By: Ernie Avena M.D.   On: 07/11/2023 12:11   DG ESOPHAGUS W SINGLE CM (SOL OR THIN BA)  Result Date: 07/10/2023 CLINICAL DATA:  Reflux esophagitis. EXAM: ESOPHOGRAM/BARIUM SWALLOW TECHNIQUE: Single contrast examination was performed using  thin barium. FLUOROSCOPY: Radiation Exposure Index (as provided by the fluoroscopic device): 18.1 mGy Kerma COMPARISON:  Chest CT of 07/06/2023 FINDINGS: Focused, single-contrast exam was necessitated. The patient was actively short of breath and could not be laid on the fluoroscopic table. Therefore, fluoroscopy was performed with patient sitting in a wheelchair with attempted swallows. Evaluation of esophageal motility was not possible. The patient struggled with multiple consecutive swallows. Prominent cricopharyngeus is identified on 06/15. No tight esophageal stricture identified. No obstruction of contrast passage. Barium tablet not given. No gross spontaneous gastroesophageal reflux identified. IMPRESSION: Severely limited exam, performed with patient sitting in wheelchair and not on fluoroscopy table. Please see above. No gross esophageal stricture identified.  Electronically Signed   By: Jeronimo Greaves M.D.   On: 07/10/2023 16:23   DG Swallowing Func-Speech Pathology  Result Date: 07/10/2023 Table formatting from the original result was not included. Modified Barium Swallow Study Patient Details Name: JIOVANNA FREI MRN: [id number] Date of Birth: 1946-04-02 Today's Date: 07/10/2023 HPI/PMH: HPI: Per hospitalist note "78 y.o. female with medical history significant of pulmonary asbestosis, chronic hypoxemic respiratory failure on 3 L home oxygen, vocal cord dysfunction, OSA, chronic HFpEF, history of PE, chronic pain, anxiety/depression, nonobstructive CAD, hypertension, idiopathic angioedema-urticaria, neuroleptic induced parkinsonism presented with worsening shortness of breath.  On presentation, she was in severe respiratory distress requiring nonrebreather.  CT chest with contrast showed findings consistent with asbestos related pleural disease or posttraumatic changes.  She was started on IV steroids.  Pulmonary was consulted."  Pulmonary ordered esophagram due to pt's extensive esophageal history including esophageal stricture.  Radiologist deemed SLP evaluation needed first to assure pt could swallow lying down - therefore CCM ordered MBS - and esophagram.  Imaging of neck showed  Chronic degenerative ankylosis of the C5 and C6 vertebral bodies.  CT chest "Heavy bilateral pleural calcifications with associated linear and  nodular pleural and subpleural thickening. Most prominent nodular  thickening is seen in the left lower lobe. Findings are consistent  with asbestos related pleural disease or posttraumatic changes."  Old lacunar  infarction in the right caudate head seen on prior MRI.  RN reports pt coughing frequently but cannot differentiate if coughing could be related to aspiration during or after po. Clinical Impression: Clinical Impression: Minimal barium provided to pt for MBS to assure pt able to adequately protect her airway with liquid consumption  given esophagram ordered and concern for aspiration.  SlP tested pt reclined with liquids between nectar and thin consistency - with pt demonstrating adequate airway protection.  Pt was tested with head of chair reclined to approx 45* to emulate esophagram with adequate airway protection observed.  Also tested with thin via tsp,cup, thin/nectar mixture via straw and nectar liquids tsp, cup, straw- Trace upper laryngeal penetration of thin cleared with further swallow.  Pharyngeal swallow is strong without retention.   She does appear with significantly prominent cricopharyngeus.  Spoke to radiologist regarding pt's findings. Factors that may increase risk of adverse event in presence of aspiration [name] & Clearance Coots 2021): No data recorded Recommendations/Plan: Swallowing Evaluation Recommendations Swallowing Evaluation Recommendations Recommendations: -- (thin liquids and defer to MD for solid food recommendation) Liquid Administration via: Cup; Straw Medication Administration: Other (Comment) Supervision: Patient able to self-feed Swallowing strategies  : Slow rate; Small bites/sips Postural changes: Position pt fully upright for meals Oral care recommendations: Oral care BID (2x/day) Treatment Plan Treatment Plan Treatment recommendations: No treatment recommended at this time (evaluation was to rule out silent aspiration) Follow-up recommendations: No SLP follow up Functional status assessment: Patient has not had a recent decline in their functional status. Recommendations Recommendations for follow up therapy are one component of a multi-disciplinary discharge planning process, led by the attending physician.  Recommendations may be updated based on patient status, additional functional criteria and insurance authorization. Assessment: Orofacial Exam: Orofacial Exam Oral Cavity: Oral Hygiene: WFL Oral Cavity - Dentition: Dentures, top; Other (Comment) (lower dentition present) Orofacial Anatomy: WFL Oral  Motor/Sensory Function: Other (comment) Anatomy: Anatomy: Other (Comment) (pt with chronic degenerative ankylosis per report) Boluses Administered: Boluses Administered Boluses Administered: Thin liquids (Level 0); Mildly thick liquids (Level 2, nectar thick)  Oral Impairment Domain: Oral Impairment Domain Lip Closure: No labial escape Tongue control during bolus hold: Cohesive bolus between tongue to palatal seal Bolus transport/lingual motion: Brisk tongue motion Oral residue: Trace residue lining oral structures Initiation of pharyngeal swallow : Pyriform sinuses  Pharyngeal Impairment Domain: Pharyngeal Impairment Domain Soft palate elevation: No bolus between soft palate (SP)/pharyngeal wall (PW) Laryngeal elevation: Partial superior movement of thyroid cartilage/partial approximation of arytenoids to epiglottic petiole Anterior hyoid excursion: Partial anterior movement Epiglottic movement: Complete inversion Laryngeal vestibule closure: Incomplete, narrow column air/contrast in laryngeal vestibule Pharyngeal stripping wave : Present - complete Pharyngeal contraction (A/P view only): N/A Pharyngoesophageal segment opening: Partial distention/partial duration, partial obstruction of flow Tongue base retraction: No contrast between tongue base and posterior pharyngeal wall (PPW) Pharyngeal residue: Complete pharyngeal clearance  Esophageal Impairment Domain: Esophageal Impairment Domain Esophageal clearance upright position: -- (DNT) Pill: Pill Consistency administered: -- (DNT) Penetration/Aspiration Scale Score: Penetration/Aspiration Scale Score 1.  Material does not enter airway: Mildly thick liquids (Level 2, nectar thick) 2.  Material enters airway, remains ABOVE vocal cords then ejected out: Thin liquids (Level 0) Compensatory Strategies: Compensatory Strategies Compensatory strategies: No   General Information: Caregiver present: No  Diet Prior to this Study: Regular; Thin liquids (Level 0)   Temperature  : Normal   Respiratory Status: Tachypneic   Supplemental O2: High flow nasal cannula (six liters)   History of Recent Intubation: No  Behavior/Cognition: Alert; Cooperative; Pleasant mood Self-Feeding Abilities: Able to self-feed Baseline vocal quality/speech: Dysphonic Volitional Cough: Able to elicit Volitional Swallow: Able to elicit Exam Limitations: No limitations Goal Planning: No data recorded No data recorded No data recorded No data recorded Consulted and agree with results and recommendations: Patient; Other (comment); Physician (radiologist) Pain: Pain Assessment Pain Assessment: No/denies pain (no "throat discomfort") End of Session: Start Time:SLP Start Time (ACUTE ONLY): 1505 Stop Time: SLP Stop Time (ACUTE ONLY): 1515 Time Calculation:SLP Time Calculation (min) (ACUTE ONLY): 10 min Charges: SLP Evaluations $ SLP Speech Visit: 1 Visit SLP Evaluations $MBS Swallow: 1 Procedure SLP visit diagnosis: SLP Visit Diagnosis: Dysphagia, unspecified (R13.10) Past Medical History: Past Medical History: Diagnosis Date  Acute upper GI bleed 06/2014  while pt taking coumadin, plavix, and meloxicam---despite being told not to take coumadin.  Anginal pain (HCC)   Nonobstructive CAD 2014; however, her cardiologist put her on a statin for this and NOT for hyperlipidemia per pt report.  Atyp CP 08/2017 at card f/u, plan for myoc perf imaging.  Anxiety   panic attacks  BPPV (benign paroxysmal positional vertigo) 12/16/2012  CAD (coronary artery disease)   Nonobstructive ([facility])  Chronic diastolic CHF (congestive heart failure) (HCC)   dry wt as of 11/06/16 is 168 lbs.  Chronic lower back pain   DDD (degenerative disc disease)   lumbar and cervical.   Diverticular disease   Fibromyalgia   Patient states dx was around her late 60s but she had sx's for years prior to this.  H/O hiatal hernia   History of pneumonia   hospitalized 12/2011, 02/2013, and 07/2013 [facility]) for this  HTN (hypertension)   Renal artery dopplers 04/2013  neg for stenosis.  Hypervitaminosis D 09/27/2019  over-supplemented.  Stopped vit D and plan recheck 2 mo.  Idiopathic angio-edema-urticaria 29562; 2021  Angioedema component was very minimal.  2021->Dr. Bobbitt (allergist) eval.  Insomnia   Iron deficiency anemia   Hematologist in [place], [place] did extensive w/u; no cause found; failed oral supplement;; gets fairly regular (q68m or so) IV iron infusions (Venofer -iron sucrose- 200mg  with procrit.  "for 14 yr I've been getting blood work q month & getting infusions prn" (07/12/2013).  Dr. Myna Hidalgo locally, iron infusions done, EPO deficiency dx'd  Migraine syndrome   "not as often anymore; used to be ~ q wk" (07/12/2013)  Mixed incontinence urge and stress   Nephrolithiasis   "passed all on my own or they are still in there" (07/12/2013)  Neuroleptic induced parkinsonism (HCC) 2018  Dr. Arbutus Leas, neuro, saw her 11/24/17 and recommended d/c of abilify as first step.  D/c'd abilify and pt got complete recovery.  Oropharyngeal dysphagia   swallowing study speech path 05/2020. Gastric bx's showed gastritis, h pylori NEG  OSA on CPAP   prior to move to [place]--had another sleep study 10/2015 w/pulm Dr.  Ejaz.  Osteoarthritis   "severe; progressing fast" (07/12/2013); multiple joints-not surgical candidate for TKR (03/2015).  Triamcinolon knee injections by Dr. Hermelinda Medicus 12/2017.  Pernicious anemia 08/24/2014  Pleural plaque with presence of asbestos 07/22/2013  Pulmonary embolism (HCC) 07/2013  Dx at [facility] with very small peripheral upper lobe pe 07/2013: pt took coumadin for about 8-9 mo  Pyelonephritis   "several times over the last 30 yr" (07/12/2013)  RBBB (right bundle branch block)   Recurrent major depression (HCC)   Recurrent UTI   hx of hospitalization for pyelonephritis; started abx prophylaxis 06/2015  Syncope   Hypotensive; ED visit--Dr. Sharyn Lull did Cath--nonobstructive CAD, EF 55-60%.  In retrospect, suspect pt rx med misuse/polypharmacy  Vertebral compression fracture (HCC)   Acute  T12 on 02/27/22 (fall).  multiple old thoracic-->neurosurg to do MRI as of 05/2022 Past Surgical History: Past Surgical History: Procedure Laterality Date  APPENDECTOMY  1960  AXILLARY SURGERY Left 1978  Multiple "lump" in armpit per pt  BIOPSY  06/17/2020  Gastric bx->gastritis, h pylori neg.  Procedure: BIOPSY;  Surgeon: Jeani Hawking, MD;  Location: [facility];  Service: Endoscopy;;  CARDIAC CATHETERIZATION  01/2013  nonobstructive CAD, EF 55-60%  CARDIOVASCULAR STRESS TEST  02/22/2015  Low risk myocard perf imaging; wall motion normal, normal EF  CARDIOVASCULAR STRESS TEST    09/2017 myo perf low risk  carotid duplex doppler  10/21/2017  01/2023 1-30% bilat-->rpt 1 yr  COCCYX REMOVAL  1972  CT CTA CORONARY W/CA SCORE W/CM &/OR WO/CM    04/02/23 96%'tile calcium score, nonobst CAD-->crestor increased to 40  DEXA  06/05/2017  T-score -3.1  DILATION AND CURETTAGE OF UTERUS  ? 1970's  ESOPHAGOGASTRODUODENOSCOPY N/A 07/19/2014  Gastritis found + in the setting of supratherapeutic INR, +plavix, + meloxicam.  ESOPHAGOGASTRODUODENOSCOPY (EGD) WITH PROPOFOL N/A 06/17/2020  NO stricture or other prob to explain pt's dysphagia, dilation was done anyway.  Gastric bx's-->gastritis, h pylori neg. Procedure: ESOPHAGOGASTRODUODENOSCOPY (EGD) WITH PROPOFOL;  Surgeon: Jeani Hawking, MD;  Location: [facility];  Service: Endoscopy;  Laterality: N/A;  EYE SURGERY Left 2012-2013  "injections for ~ 1 yr; don't really know what for" (07/12/2013)  HEEL SPUR SURGERY Left 2008  kidney stone removal Right   KNEE SURGERY  2005  LEFT HEART CATHETERIZATION WITH CORONARY ANGIOGRAM N/A 01/30/2013  Procedure: LEFT HEART CATHETERIZATION WITH CORONARY ANGIOGRAM;  Surgeon: Robynn Pane, MD;  Location: [facility];  Service: Cardiovascular;  Laterality: N/A;  MALONEY DILATION  06/17/2020  Procedure: Elease Hashimoto DILATION;  Surgeon: Jeani Hawking, MD;  Location: [facility];  Service: Endoscopy;;  PLANTAR FASCIA RELEASE Left 2008  SPIROMETRY  04/25/2014   In hosp for acute asthma/COPD flare: mixed obstructive and restrictive lung disease. The FEV1 is severely reduced at 45% predicted.  FEV1 signif decreased compared to prior spirometry 07/23/13.  TENDON RELEASE  1996  Right forearm and hand  TOTAL ABDOMINAL HYSTERECTOMY  1974  TRANSTHORACIC ECHOCARDIOGRAM  01/2013; 04/2014;08/2015; 09/2017  2014--NORMAL.  2015--focal basal septal hypertrophy, EF 55-60%, grade I diast dysfxn, mild LAE.  08/2015 EF 55-60%, nl LV syst fxn, grade I DD, valves wnl. 10/21/17: EF 65-70%, grd I DD, o/w normal. 02/17/22 EF 70-75%, hyperdynamic LV fxn, grd I DD. Rolena Infante, MS [facility] SLP Acute Rehab Services Office [phone] Chales Abrahams 07/10/2023, 4:09 PM   Review of Systems  HENT:  Positive for voice change.   Respiratory:  Positive for cough and shortness of breath.   All other systems reviewed and are negative.  Blood pressure 123/61,  pulse 67, temperature (!) 97.5 F (36.4 C), temperature source Oral, resp. rate 15, height 5\' 4"  (1.626 m), weight 67.5 kg, SpO2 95%. Physical Exam Constitutional:      Appearance: Normal appearance. She is normal weight.  HENT:     Head: Normocephalic and atraumatic.     Right Ear: External ear normal.     Left Ear: External ear normal.     Nose: Nose normal.     Mouth/Throat:     Mouth: Mucous membranes are moist.     Pharynx: Oropharynx is clear.  Eyes:     Extraocular Movements: Extraocular movements intact.     Conjunctiva/sclera: Conjunctivae normal.     Pupils: Pupils are equal, round, and reactive to light.  Cardiovascular:     Rate and Rhythm: Normal rate.  Pulmonary:     Comments: Somewhat tachypneic.  Wet sounding cough. Musculoskeletal:     Cervical back: Normal range of motion.  Skin:    General: Skin is warm and dry.  Neurological:     General: No focal deficit present.     Mental Status: She is alert and oriented to person, place, and time.  Psychiatric:        Mood and Affect: Mood normal.        Behavior:  Behavior normal.        Thought Content: Thought content normal.        Judgment: Judgment normal.     Assessment/Plan: Shortness of breath and cough  Fiberoptic exam of the pharynx and larynx is unremarkable with no abnormal findings, no mass, and normally mobile vocal folds.  I shared results with CCM staff.  I do not see an explanation for her symptoms on my exam.  Christia Reading 07/11/2023, 6:18 PM

## 2023-07-11 NOTE — Progress Notes (Signed)
   NAME:  Linda [name], MRN:  [id number], DOB:  1946-02-21, LOS: 5 ADMISSION DATE:  07/06/2023, CONSULTATION DATE: 07/06/2023 REFERRING MD: Dr. Loney Loh, CHIEF COMPLAINT: Respiratory distress  History of Present Illness:  77 yo female with hx of asbestos exposure and pleural plaques, chronic respiratory failure with hypoxia, and vocal cord dysfunction started having trouble with her breathing about 6 weeks ago. She has cough and throat irritation. Gets a rattling in her throat. Gets anxious when she has trouble with her breathing. Nebulizer treatments helps a little.   Pertinent  Medical History  Upper GI bleeding, Anxiety, Vertigo, CAD, HFpEF, Back pain, [name], Diverticulosis, Fibromyalgia, Hiatal hernia, Pneumonia, HTN, Idiopathic urticaria, Insomnia, IDA, Migraine headache, Nephrolithiasis, OSA, Pernicious anemia, PE 2014, Pyelonephritis, RBBB, Depression  Studies:  CT chest 07/06/23 >> heavy bilateral pleural calcifications with associated nodular pleural and subpleural thickening most prominent in Lt lower lobe  Interim History / Subjective:  A little better but still coughing   Objective   Blood pressure (Abnormal) 119/47, pulse (Abnormal) 59, temperature 97.7 F (36.5 C), temperature source Oral, resp. rate 13, height 5\' 4"  (1.626 m), weight 67.5 kg, SpO2 95%.        Intake/Output Summary (Last 24 hours) at 07/11/2023 1119 Last data filed at 07/11/2023 0435 Gross per 24 hour  Intake no documentation  Output 1000 ml  Net -1000 ml   Filed Weights   07/06/23 1500 07/06/23 2145  Weight: 67.6 kg 67.5 kg    Examination:  General this is a 77 year female who is in no medical distress HENT still has hoarse voice. Marked upper airway wheezing, coughs whenever she tries talk Pulm transmitted wheezing and prob some element of true wheezing Card rrr  Abd soft + bowel sounds Ext warm and dry  Neuro awake and oriented   Assessment & Plan:   Acute to subacute cough, throat  irritation and respiratory distress due to VCD vs Laryngopharyngeal reflux disease (LPR)  - most of her symptom burden is 2/2 upper airway irritation  Plan continue nasal saline spray, azelastine, fluticasone nasal spray. continue tussionex, also added gabapentin. We need to focus on cough suppression to decrease her irritation  continue protonix bid and pepcid at bedtime Reflux precautions day 5 of zithromax (completed today) day 4 of nystatin swish and swallow would benefit from speech therapy training as an outpt I think we need to get ENT to eval (long standing vocal hoarseness and also concern about either VCD vs paralysis)  Asthma. Plan continue solumedrol 40 mg daily Continue singulair 10 mg at bedtime continue yupelri, brovana, pulmicort flutter valve, mucinex, prn albuterol   History of obstructive sleep apnea. Plan Reassess as out pt  Asbestos related pleural plaques. - has more prominent pleural thickening Plan will need PET scan as an outpt to assess further and then determine if she needs pleural biopsy   Anxiety, Depression, Chronic pain. -respiratory symptoms get worse when she gets anxious, and difficult to determine which comes first Plan continue anxiety and pain control regimen; I added gabapentin 7/18  Simonne Martinet ACNP-BC [facility] Pulmonary/Critical Care Pager # [phone] OR # [phone] if no answer

## 2023-07-12 ENCOUNTER — Inpatient Hospital Stay (HOSPITAL_COMMUNITY): Payer: Medicare Other

## 2023-07-12 DIAGNOSIS — J9621 Acute and chronic respiratory failure with hypoxia: Secondary | ICD-10-CM | POA: Diagnosis not present

## 2023-07-12 DIAGNOSIS — K219 Gastro-esophageal reflux disease without esophagitis: Secondary | ICD-10-CM | POA: Diagnosis not present

## 2023-07-12 DIAGNOSIS — J9601 Acute respiratory failure with hypoxia: Secondary | ICD-10-CM

## 2023-07-12 DIAGNOSIS — J4541 Moderate persistent asthma with (acute) exacerbation: Secondary | ICD-10-CM | POA: Diagnosis not present

## 2023-07-12 DIAGNOSIS — J61 Pneumoconiosis due to asbestos and other mineral fibers: Secondary | ICD-10-CM

## 2023-07-12 LAB — CBC
HCT: 39.9 % (ref 36.0–46.0)
Hemoglobin: 12.5 g/dL (ref 12.0–15.0)
MCH: 28.3 pg (ref 26.0–34.0)
MCHC: 31.3 g/dL (ref 30.0–36.0)
MCV: 90.5 fL (ref 80.0–100.0)
Platelets: 228 10*3/uL (ref 150–400)
RBC: 4.41 MIL/uL (ref 3.87–5.11)
RDW: 13.2 % (ref 11.5–15.5)
WBC: 10 10*3/uL (ref 4.0–10.5)
nRBC: 0 % (ref 0.0–0.2)

## 2023-07-12 LAB — PROCALCITONIN: Procalcitonin: <0.1 ng/mL

## 2023-07-12 MED ORDER — ORAL CARE MOUTH RINSE: 15.0000 mL | OROMUCOSAL | Status: DC | PRN

## 2023-07-12 MED ORDER — LORATADINE 10 MG PO TABS
10.0000 mg | ORAL_TABLET | Freq: Every day | ORAL | Status: DC
  Administered 2023-07-13 – 2023-07-21 (×9): 10 mg via ORAL
  Filled 2023-07-12 (×10): qty 1

## 2023-07-12 MED ORDER — METHYLPREDNISOLONE SODIUM SUCC 40 MG IJ SOLR
40.0000 mg | Freq: Two times a day (BID) | INTRAMUSCULAR | Status: DC
  Administered 2023-07-12 – 2023-07-14 (×4): 40 mg via INTRAVENOUS
  Filled 2023-07-12 (×4): qty 1

## 2023-07-12 NOTE — Plan of Care (Signed)

## 2023-07-12 NOTE — Progress Notes (Signed)
NAME:  Linda [name], MRN:  [id number], DOB:  01-27-1946, LOS: 6 ADMISSION DATE:  07/06/2023, CONSULTATION DATE: 07/06/2023 REFERRING MD: Dr. Loney Loh, CHIEF COMPLAINT: Respiratory distress  History of Present Illness:  77 yo female with hx of asbestos exposure and pleural plaques, chronic respiratory failure with hypoxia, and vocal cord dysfunction started having trouble with her breathing about 6 weeks ago. She has cough and throat irritation. Gets a rattling in her throat. Gets anxious when she has trouble with her breathing. Nebulizer treatments helps a little.   Pertinent  Medical History  Upper GI bleeding, Anxiety, Vertigo, CAD, HFpEF, Back pain, [name], Diverticulosis, Fibromyalgia, Hiatal hernia, Pneumonia, HTN, Idiopathic urticaria, Insomnia, IDA, Migraine headache, Nephrolithiasis, OSA, Pernicious anemia, PE 2014, Pyelonephritis, RBBB, Depression  Studies:  CT chest 07/06/23 >> heavy bilateral pleural calcifications with associated nodular pleural and subpleural thickening most prominent in Lt lower lobe  Interim History / Subjective:  She continues to feel poorly. No acute events overnight.   Objective   Blood pressure 124/61, pulse 89, temperature 98.1 F (36.7 C), temperature source Oral, resp. rate (!) 23, height 5\' 4"  (1.626 m), weight 67.5 kg, SpO2 93%.        Intake/Output Summary (Last 24 hours) at 07/12/2023 1014 Last data filed at 07/12/2023 0330 Gross per 24 hour  Intake --  Output 800 ml  Net -800 ml   Filed Weights   07/06/23 1500 07/06/23 2145  Weight: 67.6 kg 67.5 kg    Examination:  General  chronically ill appearing woman lying in bed in NAD watching TV HENT [place]/AT, eyes anicteric, no nasal drainage.  Pulm wheezing, rhonchi bilaterally. Wet-sounding cough.  Weak hoarse sounding voice.  Card S1S2, RRR  Abd soft, NT Ext no c/c/e Neuro awake, alert, moving extremities  No cultures or new imaging No updated labs this AM.   Assessment & Plan:    Acute respiratory failure with hypoxia- has a chronic history of asbestosis and pleural plaques, but hypoxia seems disproportionate to the parenchymal and pleural abnormalities on her CT. She is wheezing on exam that could explain some of it.  Subacute onset over 7 weeks is worrisome for post-viral syndrome.  -repeat CBC & PCT -may need sinus CT to evaluate for sinusitis that could be trigger ongoing asthma and would not have resolved with 5-day course of antibiotics and repeat chest CT to re-look; CXR yesterday was not impressive for an ongoing cause of hypoxia -wean from O2 -con't bronchodilators & steroids  Acute to subacute cough, throat irritation and respiratory distress due to VCD vs Laryngopharyngeal reflux disease (LPR) . ENT confirmed no VC pathology. I am not convinced this is mostly VCD with ongoing hyopxia.  -appreciate ENT's assistance -con't intranasal floanse and azelastine, nasal saline -con't steroids> increase to Q12h dosing -con't singulair, adding claritin Daily -cough suppression with tussionex and gabapetnin for symptom benefit, but unlikely to be fully controlled without wheezing controlled -IgE never checked and unfortunately unlikely to be helpful on steroids -PPI, reflux precautions -can con't nystatin swish & swallow since on steroid nebs -pulmonary hygiene regimen -sputum culture   History of obstructive sleep apnea. -needs OP PSG; would not obtain until she is back to baseline  Asbestos related pleural plaques, pulmonary asbesosis -needs follow up imaging, but would not get PET with concern for acute infection right now  Anxiety, Depression, Chronic pain. -con't anxiety meds   Steffanie Dunn, DO 07/12/23 10:33 AM [facility]  For contact information, see Amion.  If no response to pager, please call PCCM consult pager. After hours, 7PM- 7AM, please call Elink.

## 2023-07-12 NOTE — Progress Notes (Signed)
PROGRESS NOTE  Linda [name] GEX:[id number] DOB: [birth year] DOA: 07/06/2023 PCP: Jeoffrey Massed, MD   LOS: 6 days   Brief Narrative / Interim history: 77 y.o. female with medical history significant of pulmonary asbestosis, chronic hypoxemic respiratory failure on 3 L home oxygen, vocal cord dysfunction, OSA, chronic HFpEF, history of PE, chronic pain, anxiety/depression, nonobstructive CAD, hypertension, idiopathic angioedema-urticaria, neuroleptic induced parkinsonism presented with worsening shortness of breath.  On presentation, she was in severe respiratory distress requiring nonrebreather.  CT chest with contrast showed findings consistent with asbestos related pleural disease or posttraumatic changes.  She was started on IV steroids.  Pulmonary was consulted.   Subjective / 24h Interval events: Breathing about the same.  Continues to have a lot of rattling in her chest  Assesement and Plan: Principal Problem:   Acute on chronic respiratory failure (HCC) Active Problems:   Chronic pain   OSA (obstructive sleep apnea)   Chronic heart failure with preserved ejection fraction (HFpEF) (HCC)   Anxiety   Hypokalemia   CAD-minor 2014   Pulmonary asbestosis (HCC)   Vocal cord dysfunction   Subacute cough   Acute respiratory failure with hypoxia (HCC)   Principal problem Acute on chronic hypoxic respiratory failure, Vocal cord dysfunction with acute flare, Pleural plaques consistent with asbestos lung disease -Normally wears 3 L oxygen via nasal cannula.  Presented in severe respiratory distress requiring nonrebreather, improved some but still with persistent symptoms.  On 4 L -Pulmonary consulted, appreciate input -Continue Solu-Medrol and current nebs.  Currently on Zithromax as well as per pulmonary.   -Continue anxiolytics and pain meds -Esophagogram 7/18 without strictures but could not evaluate for reflux, continue hypertonic saline. Continue to encourage sittin in the  chair throughout the day and ambulating -ENT evaluated yesterday, no vocal cord issues  Active problems OSA -Not on CPAP at home.  Will need outpatient sleep study   Chronic diastolic heart failure Hypertension Hyperlipidemia History of nonobstructive CAD -Compensated.  Echo in February 2023 showed EF of 70 to 75% with grade 1 diastolic dysfunction.  Strict input and output.  Daily weights.  Outpatient follow-up with cardiology.  Continue Imdur, metoprolol succinate -Continue statin -no chest pain   Chronic pain with possible opiate dependence Anxiety/depression -Continue current regimen.  Outpatient follow-up with PCP/pain management   Hypokalemia -replete and continue to monitor   Physical deconditioning -Consult PT  Scheduled Meds:  ALPRAZolam  0.5 mg Oral TID   amLODipine  5 mg Oral Daily   arformoterol  15 mcg Nebulization BID   aspirin EC  81 mg Oral QHS   azelastine  1 spray Each Nare q morning   budesonide (PULMICORT) nebulizer solution  0.25 mg Nebulization BID   Chlorhexidine Gluconate Cloth  6 each Topical Daily   chlorpheniramine-HYDROcodone  5 mL Oral Q12H   DULoxetine  30 mg Oral Daily   enoxaparin (LOVENOX) injection  40 mg Subcutaneous Q24H   famotidine  20 mg Oral QHS   fluticasone  1 spray Each Nare QHS   gabapentin  100 mg Oral BID   guaiFENesin  1,200 mg Oral BID   isosorbide mononitrate  60 mg Oral BID   lamoTRIgine  200 mg Oral Daily   [START ON 07/13/2023] loratadine  10 mg Oral Daily   methylPREDNISolone (SOLU-MEDROL) injection  40 mg Intravenous Q12H   metoprolol succinate  100 mg Oral Daily   montelukast  10 mg Oral QHS   multivitamin with minerals  1 tablet Oral Daily  mouth rinse  15 mL Mouth Rinse 4 times per day   pantoprazole  40 mg Oral BID AC   revefenacin  175 mcg Nebulization Daily   rosuvastatin  40 mg Oral QPM   sodium chloride  2 spray Each Nare BID   traZODone  100-200 mg Oral QHS   Continuous Infusions: PRN Meds:.hydrALAZINE,  ipratropium-albuterol, methocarbamol, ondansetron (ZOFRAN) IV, mouth rinse, oxyCODONE-acetaminophen **AND** oxyCODONE  Current Outpatient Medications  Medication Instructions   albuterol (VENTOLIN HFA) 108 (90 Base) MCG/ACT inhaler 1-2 puffs, Inhalation, Every 6 hours PRN   ALPRAZolam (XANAX) 0.5 MG tablet TAKE ONE TABLET BY MOUTH THREE TIMES DAILY   amLODipine (NORVASC) 5 mg, Oral, Daily   aspirin 81 mg, Oral, Daily at bedtime   benzonatate (TESSALON) 100 mg, Oral, 2 times daily PRN   BREZTRI AEROSPHERE 160-9-4.8 MCG/ACT AERO 2 puffs, Inhalation, 2 times daily   buprenorphine (BUTRANS) 5 MCG/HR PTWK 1 patch, Transdermal, Weekly   cyclobenzaprine (FLEXERIL) 5 mg, Oral, 2 times daily PRN   diclofenac Sodium (VOLTAREN) 2 g, Topical, 4 times daily, 4 times a day to both knees   DULoxetine (CYMBALTA) 60 mg, Oral, Daily   EPINEPHrine (EPI-PEN) 0.3 mg, Intramuscular, As needed   ipratropium (ATROVENT) 0.03 % nasal spray 2 sprays, Each Nare, Every 12 hours PRN   ipratropium-albuterol (DUONEB) 0.5-2.5 (3) MG/3ML SOLN 3 mLs, Nebulization, Every 4 hours PRN   isosorbide mononitrate (IMDUR) 60 mg, Oral, 2 times daily   lamoTRIgine (LAMICTAL) 200 mg, Oral, Daily   methocarbamol (ROBAXIN) 500 mg, Oral, Every 8 hours PRN   metoprolol succinate (TOPROL-XL) 100 MG 24 hr tablet take ONE tab daily, take additional 1/2 tab as needed FOR FOR BLOOD PRESSURE > 160/90   metoprolol tartrate (LOPRESSOR) 50 MG tablet Take 2 hours prior to CT scan   Multiple Vitamin (MULTIVITAMIN WITH MINERALS) TABS tablet 1 tablet, Oral, Daily with breakfast   nitroGLYCERIN (NITROSTAT) 0.4 mg, Sublingual, Every 5 min PRN   oxyCODONE-acetaminophen (PERCOCET) 10-325 MG tablet 1 tablet, Oral, Every 6 hours PRN   OXYGEN 3 L/min, Inhalation, Continuous   pantoprazole (PROTONIX) 40 mg, Oral, 2 times daily   rOPINIRole (REQUIP) 0.5 MG tablet TAKE ONE TABLET BY MOUTH AT BEDTIME   rosuvastatin (CRESTOR) 40 mg, Oral, Every evening    solifenacin (VESICARE) 10 MG tablet TAKE ONE TABLET BY MOUTH ONCE DAILY **NEED OFFICE VISIT** FOR FURTHER REFILLS   traZODone (DESYREL) 50 MG tablet take 2-4 tablets at bedtime as needed for insomnia.    Diet Orders (From admission, onward)     Start     Ordered   07/06/23 2036  Diet Heart Room service appropriate? Yes; Fluid consistency: Thin  Diet effective now       Question Answer Comment  Room service appropriate? Yes   Fluid consistency: Thin      07/06/23 2036            DVT prophylaxis: enoxaparin (LOVENOX) injection 40 mg Start: 07/06/23 2200   Lab Results  Component Value Date   PLT 228 07/12/2023      Code Status: DNR  Family Communication: no family at bedside   Status is: Inpatient Remains inpatient appropriate because: severity of illness  Level of care: Stepdown  Consultants:  PCCM  Objective: Vitals:   07/12/23 0754 07/12/23 0800 07/12/23 0900 07/12/23 1000  BP:  (!) 145/64 (!) 96/42 124/61  Pulse:  62 (!) 56 89  Resp:  11 16 (!) 23  Temp: 98.1 F (36.7 C)  TempSrc: Oral     SpO2:  97% 95% 93%  Weight:      Height:        Intake/Output Summary (Last 24 hours) at 07/12/2023 1116 Last data filed at 07/12/2023 0330 Gross per 24 hour  Intake --  Output 800 ml  Net -800 ml   Wt Readings from Last 3 Encounters:  07/06/23 67.5 kg  07/02/23 67.6 kg  06/11/23 67.1 kg    Examination:  Constitutional: NAD Eyes: lids and conjunctivae normal, no scleral icterus ENMT: mmm Neck: normal, supple Respiratory: Bilateral rhonchi Cardiovascular: Regular rate and rhythm, no murmurs / rubs / gallops. No LE edema. Abdomen: soft, no distention, no tenderness. Bowel sounds positive.   Data Reviewed: I have independently reviewed following labs and imaging studies   CBC Recent Labs  Lab 07/06/23 1324 07/07/23 0301 07/10/23 0326 07/11/23 0335 07/12/23 1037  WBC 7.5 8.9 13.9* 11.3* 10.0  HGB 10.7* 12.7 12.1 12.8 12.5  HCT 33.6* 40.2 38.0  40.1 39.9  PLT 177 213 226 243 228  MCV 90.1 89.7 90.3 89.3 90.5  MCH 28.7 28.3 28.7 28.5 28.3  MCHC 31.8 31.6 31.8 31.9 31.3  RDW 13.2 13.0 13.1 13.2 13.2  LYMPHSABS 1.4  --   --   --   --   MONOABS 0.7  --   --   --   --   EOSABS 0.3  --   --   --   --   BASOSABS 0.0  --   --   --   --     Recent Labs  Lab 07/06/23 1324 07/07/23 0301 07/10/23 0326 07/11/23 0335  NA 138 138 137 136  K 3.3* 3.9 3.4* 3.8  CL 106 103 102 99  CO2 23 23 26 30   GLUCOSE 91 160* 118* 114*  BUN 14 13 20 22   CREATININE 0.69 0.73 0.81 1.01*  CALCIUM 8.1* 9.0 8.2* 8.5*  AST 16  --  17 19  ALT 11  --  16 17  ALKPHOS 65  --  59 60  BILITOT 0.7  --  0.7 0.5  ALBUMIN 3.4*  --  3.2* 3.2*  MG  --  2.3 2.3 2.6*  BNP 83.4  --   --   --     ------------------------------------------------------------------------------------------------------------------ No results for input(s): "CHOL", "HDL", "LDLCALC", "TRIG", "CHOLHDL", "LDLDIRECT" in the last 72 hours.  Lab Results  Component Value Date   HGBA1C 5.3 02/27/2022   ------------------------------------------------------------------------------------------------------------------ No results for input(s): "TSH", "T4TOTAL", "T3FREE", "THYROIDAB" in the last 72 hours.  Invalid input(s): "FREET3"  Cardiac Enzymes No results for input(s): "CKMB", "TROPONINI", "MYOGLOBIN" in the last 168 hours.  Invalid input(s): "CK" ------------------------------------------------------------------------------------------------------------------    Component Value Date/Time   BNP 83.4 07/06/2023 1324   BNP 19.9 10/27/2015 1602    CBG: No results for input(s): "GLUCAP" in the last 168 hours.  Recent Results (from the past 240 hour(s))  MRSA Next Gen by PCR, Nasal     Status: None   Collection Time: 07/06/23  9:48 PM   Specimen: Nasal Mucosa; Nasal Swab  Result Value Ref Range Status   MRSA by PCR Next Gen NOT DETECTED NOT DETECTED Final    Comment: (NOTE) The  GeneXpert MRSA Assay (FDA approved for NASAL specimens only), is one component of a comprehensive MRSA colonization surveillance program. It is not intended to diagnose MRSA infection nor to guide or monitor treatment for MRSA infections. Test performance is not FDA approved in patients less  than 19 years old. Performed at [facility], [address] [place], [place] [address]   Respiratory (~20 pathogens) panel by PCR     Status: None   Collection Time: 07/10/23  8:03 AM   Specimen: Nasopharyngeal Swab; Respiratory  Result Value Ref Range Status   Adenovirus NOT DETECTED NOT DETECTED Final   Coronavirus 229E NOT DETECTED NOT DETECTED Final    Comment: (NOTE) The Coronavirus on the Respiratory Panel, DOES NOT test for the novel  Coronavirus (2019 nCoV)    Coronavirus HKU1 NOT DETECTED NOT DETECTED Final   Coronavirus NL63 NOT DETECTED NOT DETECTED Final   Coronavirus OC43 NOT DETECTED NOT DETECTED Final   Metapneumovirus NOT DETECTED NOT DETECTED Final   Rhinovirus / Enterovirus NOT DETECTED NOT DETECTED Final   Influenza A NOT DETECTED NOT DETECTED Final   Influenza B NOT DETECTED NOT DETECTED Final   Parainfluenza Virus 1 NOT DETECTED NOT DETECTED Final   Parainfluenza Virus 2 NOT DETECTED NOT DETECTED Final   Parainfluenza Virus 3 NOT DETECTED NOT DETECTED Final   Parainfluenza Virus 4 NOT DETECTED NOT DETECTED Final   Respiratory Syncytial Virus NOT DETECTED NOT DETECTED Final   Bordetella pertussis NOT DETECTED NOT DETECTED Final   Bordetella Parapertussis NOT DETECTED NOT DETECTED Final   Chlamydophila pneumoniae NOT DETECTED NOT DETECTED Final   Mycoplasma pneumoniae NOT DETECTED NOT DETECTED Final    Comment: Performed at [facility], [address], [place], [place] [address]  SARS Coronavirus 2 by RT PCR (hospital order, performed in [facility] hospital lab) *cepheid single result test* Anterior Nasal Swab     Status: None   Collection Time:  07/10/23  8:03 AM   Specimen: Anterior Nasal Swab  Result Value Ref Range Status   SARS Coronavirus 2 by RT PCR NEGATIVE NEGATIVE Final    Comment: (NOTE) SARS-CoV-2 target nucleic acids are NOT DETECTED.  The SARS-CoV-2 RNA is generally detectable in upper and lower respiratory specimens during the acute phase of infection. The lowest concentration of SARS-CoV-2 viral copies this assay can detect is 250 copies / mL. A negative result does not preclude SARS-CoV-2 infection and should not be used as the sole basis for treatment or other patient management decisions.  A negative result may occur with improper specimen collection / handling, submission of specimen other than nasopharyngeal swab, presence of viral mutation(s) within the areas targeted by this assay, and inadequate number of viral copies (<250 copies / mL). A negative result must be combined with clinical observations, patient history, and epidemiological information.  Fact Sheet for Patients:   RoadLapTop.co.za  Fact Sheet for Healthcare Providers: http://kim-miller.com/  This test is not yet approved or  cleared by the [place] FDA and has been authorized for detection and/or diagnosis of SARS-CoV-2 by FDA under an Emergency Use Authorization (EUA).  This EUA will remain in effect (meaning this test can be used) for the duration of the COVID-19 declaration under Section 564(b)(1) of the Act, 21 U.S.C. section 360bbb-3(b)(1), unless the authorization is terminated or revoked sooner.  Performed at [facility], [address], [place], [place] [address]      Radiology Studies: No results found.   Pamella Pert, MD, PhD [facility]  Between 7 am - 7 pm I am available, please contact me via Amion (for emergencies) or Securechat (non urgent messages)  Between 7 pm - 7 am I am not available, please contact night coverage MD/APP via  Amion

## 2023-07-13 DIAGNOSIS — J61 Pneumoconiosis due to asbestos and other mineral fibers: Secondary | ICD-10-CM | POA: Diagnosis not present

## 2023-07-13 DIAGNOSIS — B9689 Other specified bacterial agents as the cause of diseases classified elsewhere: Secondary | ICD-10-CM | POA: Diagnosis not present

## 2023-07-13 DIAGNOSIS — J9601 Acute respiratory failure with hypoxia: Secondary | ICD-10-CM | POA: Diagnosis not present

## 2023-07-13 DIAGNOSIS — J019 Acute sinusitis, unspecified: Secondary | ICD-10-CM | POA: Diagnosis not present

## 2023-07-13 DIAGNOSIS — J4541 Moderate persistent asthma with (acute) exacerbation: Secondary | ICD-10-CM

## 2023-07-13 LAB — COMPREHENSIVE METABOLIC PANEL
ALT: 21 U/L (ref 0–44)
AST: 18 U/L (ref 15–41)
Albumin: 3.4 g/dL — ABNORMAL LOW (ref 3.5–5.0)
Alkaline Phosphatase: 54 U/L (ref 38–126)
Anion gap: 10 (ref 5–15)
BUN: 25 mg/dL — ABNORMAL HIGH (ref 8–23)
CO2: 26 mmol/L (ref 22–32)
Calcium: 8.6 mg/dL — ABNORMAL LOW (ref 8.9–10.3)
Chloride: 101 mmol/L (ref 98–111)
Creatinine, Ser: 0.81 mg/dL (ref 0.44–1.00)
GFR, Estimated: >60 mL/min (ref >60)
Glucose, Bld: 181 mg/dL — ABNORMAL HIGH (ref 70–99)
Potassium: 4.4 mmol/L (ref 3.5–5.1)
Sodium: 137 mmol/L (ref 135–145)
Total Bilirubin: 0.7 mg/dL (ref 0.3–1.2)
Total Protein: 7 g/dL (ref 6.5–8.1)

## 2023-07-13 LAB — CBC
HCT: 40.8 % (ref 36.0–46.0)
Hemoglobin: 12.8 g/dL (ref 12.0–15.0)
MCH: 28.3 pg (ref 26.0–34.0)
MCHC: 31.4 g/dL (ref 30.0–36.0)
MCV: 90.1 fL (ref 80.0–100.0)
Platelets: 259 10*3/uL (ref 150–400)
RBC: 4.53 MIL/uL (ref 3.87–5.11)
RDW: 13.1 % (ref 11.5–15.5)
WBC: 13.1 10*3/uL — ABNORMAL HIGH (ref 4.0–10.5)
nRBC: 0 % (ref 0.0–0.2)

## 2023-07-13 LAB — MAGNESIUM: Magnesium: 2.5 mg/dL — ABNORMAL HIGH (ref 1.7–2.4)

## 2023-07-13 MED ORDER — HALOPERIDOL LACTATE 5 MG/ML IJ SOLN
2.0000 mg | Freq: Four times a day (QID) | INTRAMUSCULAR | Status: DC | PRN
  Administered 2023-07-13: 2 mg via INTRAVENOUS
  Filled 2023-07-13: qty 1

## 2023-07-13 MED ORDER — SODIUM CHLORIDE 0.9 % IV SOLN
2.0000 g | Freq: Every day | INTRAVENOUS | Status: DC
  Administered 2023-07-13 – 2023-07-21 (×9): 2 g via INTRAVENOUS
  Filled 2023-07-13 (×9): qty 20

## 2023-07-13 MED ORDER — MIDAZOLAM HCL 2 MG/2ML IJ SOLN
INTRAMUSCULAR | Status: AC
  Administered 2023-07-13: 1 mg via INTRAVENOUS
  Filled 2023-07-13: qty 2

## 2023-07-13 MED ORDER — OXYCODONE-ACETAMINOPHEN 5-325 MG PO TABS
2.0000 | ORAL_TABLET | ORAL | Status: DC | PRN
  Administered 2023-07-13 – 2023-07-14 (×4): 2 via ORAL
  Filled 2023-07-13 (×5): qty 2

## 2023-07-13 MED ORDER — HYDROCOD POLI-CHLORPHE POLI ER 10-8 MG/5ML PO SUER: 5.0000 mL | Freq: Every evening | ORAL | Status: DC | PRN

## 2023-07-13 MED ORDER — MIDAZOLAM HCL 2 MG/2ML IJ SOLN: 1.0000 mg | Freq: Once | INTRAMUSCULAR | Status: AC

## 2023-07-13 NOTE — Plan of Care (Signed)

## 2023-07-13 NOTE — Progress Notes (Signed)
I was notified by RT and RN that Ms. Vore had been arguing with them about her treatments today and her family had questions.   I updated Ms. Osias and her DIL at bedside about her CT results, laryngoscopy results from Friday, treatment updates, and the ongoing treatments we are doing. I let them know I would not expect for the antibiotics to make a dramatic change in her symptoms for likely 2-3 days given slow resolution of sinusitis.   During this conversation I was informed that she had a choking episode at lunch with pineapple requiring her son to do the Heimlich maneuver. By the time the nurse knew of this she was saturating fine with stable VS and the whole event had resolved. She does not want to be NPO. I discussed that severe episodes of choking can lead to her heart stopping, and I recommended NPO and repeat SLP evaluation with small bore NGT for alternative nutrition in the interim. She declined.   DIL came out of the room a few minutes later and reported Ms. Coca has uncontrolled agitation that we both think is from the steroids an requested something to address this, preferably something that may help her sleep. Haldol 2mg  IV ordered, RN to administer immediately.   Steffanie Dunn, DO 07/13/23 4:34 PM [facility]  For contact information, see Amion. If no response to pager, please call PCCM consult pager. After hours, 7PM- 7AM, please call Elink.

## 2023-07-13 NOTE — Progress Notes (Signed)
PROGRESS NOTE  Linda [name] UUV:[id number] DOB: 21-Jul-1946 DOA: 07/06/2023 PCP: Jeoffrey Massed, MD   LOS: 7 days   Brief Narrative / Interim history: 77 y.o. female with medical history significant of pulmonary asbestosis, chronic hypoxemic respiratory failure on 3 L home oxygen, vocal cord dysfunction, OSA, chronic HFpEF, history of PE, chronic pain, anxiety/depression, nonobstructive CAD, hypertension, idiopathic angioedema-urticaria, neuroleptic induced parkinsonism presented with worsening shortness of breath.  On presentation, she was in severe respiratory distress requiring nonrebreather.  CT chest with contrast showed findings consistent with asbestos related pleural disease or posttraumatic changes.  She was started on IV steroids.  Pulmonary was consulted.   Subjective / 24h Interval events: Breathing about the same, does not appreciate a significant improvement.  Assesement and Plan: Principal Problem:   Acute on chronic respiratory failure (HCC) Active Problems:   Chronic pain   OSA (obstructive sleep apnea)   Chronic heart failure with preserved ejection fraction (HFpEF) (HCC)   Anxiety   Hypokalemia   CAD-minor 2014   Pulmonary asbestosis (HCC)   Vocal cord dysfunction   Subacute cough   Acute respiratory failure with hypoxia (HCC)   Principal problem Acute on chronic hypoxic respiratory failure, Vocal cord dysfunction with acute flare, Pleural plaques consistent with asbestos lung disease -Normally wears 3 L oxygen via nasal cannula.  Presented in severe respiratory distress requiring nonrebreather, improved some but still with persistent symptoms.  Remains on 3 to 4 L this morning -Pulmonary consulted, appreciate input -Continue Solu-Medrol and current nebs.  Complete Zithromax -Continue anxiolytics and pain meds -Esophagogram 7/18 without strictures but could not evaluate for reflux, continue hypertonic saline. Continue to encourage sittin in the chair  throughout the day and ambulating -ENT evaluated on 7/19, no vocal cord issues -?  Postnasal drip related to sinusitis based on CT scan, started on antibiotics per PCCM  Active problems OSA -Not on CPAP at home.  Will need outpatient sleep study   Chronic diastolic heart failure Hypertension Hyperlipidemia History of nonobstructive CAD -Compensated.  Echo in February 2023 showed EF of 70 to 75% with grade 1 diastolic dysfunction.  Strict input and output.  Daily weights.  Outpatient follow-up with cardiology.  Continue Imdur, metoprolol succinate -Continue statin -no chest pain   Chronic pain with possible opiate dependence Anxiety/depression -Continue current regimen.  Outpatient follow-up with PCP/pain management   Hypokalemia -replete and continue to monitor, potassium now normalized.  Magnesium normal/high   Physical deconditioning -Consult PT  Scheduled Meds:  ALPRAZolam  0.5 mg Oral TID   amLODipine  5 mg Oral Daily   arformoterol  15 mcg Nebulization BID   aspirin EC  81 mg Oral QHS   azelastine  1 spray Each Nare q morning   budesonide (PULMICORT) nebulizer solution  0.25 mg Nebulization BID   Chlorhexidine Gluconate Cloth  6 each Topical Daily   DULoxetine  30 mg Oral Daily   enoxaparin (LOVENOX) injection  40 mg Subcutaneous Q24H   famotidine  20 mg Oral QHS   fluticasone  1 spray Each Nare QHS   gabapentin  100 mg Oral BID   guaiFENesin  1,200 mg Oral BID   isosorbide mononitrate  60 mg Oral BID   lamoTRIgine  200 mg Oral Daily   loratadine  10 mg Oral Daily   methylPREDNISolone (SOLU-MEDROL) injection  40 mg Intravenous Q12H   metoprolol succinate  100 mg Oral Daily   montelukast  10 mg Oral QHS   multivitamin with minerals  1  tablet Oral Daily   mouth rinse  15 mL Mouth Rinse 4 times per day   pantoprazole  40 mg Oral BID AC   revefenacin  175 mcg Nebulization Daily   rosuvastatin  40 mg Oral QPM   sodium chloride  2 spray Each Nare BID   traZODone   100-200 mg Oral QHS   Continuous Infusions:  cefTRIAXone (ROCEPHIN)  IV 2 g (07/13/23 0945)   PRN Meds:.hydrALAZINE, ipratropium-albuterol, methocarbamol, ondansetron (ZOFRAN) IV, mouth rinse, oxyCODONE-acetaminophen **AND** oxyCODONE  Current Outpatient Medications  Medication Instructions   albuterol (VENTOLIN HFA) 108 (90 Base) MCG/ACT inhaler 1-2 puffs, Inhalation, Every 6 hours PRN   ALPRAZolam (XANAX) 0.5 MG tablet TAKE ONE TABLET BY MOUTH THREE TIMES DAILY   amLODipine (NORVASC) 5 mg, Oral, Daily   aspirin 81 mg, Oral, Daily at bedtime   benzonatate (TESSALON) 100 mg, Oral, 2 times daily PRN   BREZTRI AEROSPHERE 160-9-4.8 MCG/ACT AERO 2 puffs, Inhalation, 2 times daily   buprenorphine (BUTRANS) 5 MCG/HR PTWK 1 patch, Transdermal, Weekly   cyclobenzaprine (FLEXERIL) 5 mg, Oral, 2 times daily PRN   diclofenac Sodium (VOLTAREN) 2 g, Topical, 4 times daily, 4 times a day to both knees   DULoxetine (CYMBALTA) 60 mg, Oral, Daily   EPINEPHrine (EPI-PEN) 0.3 mg, Intramuscular, As needed   ipratropium (ATROVENT) 0.03 % nasal spray 2 sprays, Each Nare, Every 12 hours PRN   ipratropium-albuterol (DUONEB) 0.5-2.5 (3) MG/3ML SOLN 3 mLs, Nebulization, Every 4 hours PRN   isosorbide mononitrate (IMDUR) 60 mg, Oral, 2 times daily   lamoTRIgine (LAMICTAL) 200 mg, Oral, Daily   methocarbamol (ROBAXIN) 500 mg, Oral, Every 8 hours PRN   metoprolol succinate (TOPROL-XL) 100 MG 24 hr tablet take ONE tab daily, take additional 1/2 tab as needed FOR FOR BLOOD PRESSURE > 160/90   metoprolol tartrate (LOPRESSOR) 50 MG tablet Take 2 hours prior to CT scan   Multiple Vitamin (MULTIVITAMIN WITH MINERALS) TABS tablet 1 tablet, Oral, Daily with breakfast   nitroGLYCERIN (NITROSTAT) 0.4 mg, Sublingual, Every 5 min PRN   oxyCODONE-acetaminophen (PERCOCET) 10-325 MG tablet 1 tablet, Oral, Every 6 hours PRN   OXYGEN 3 L/min, Inhalation, Continuous   pantoprazole (PROTONIX) 40 mg, Oral, 2 times daily   rOPINIRole  (REQUIP) 0.5 MG tablet TAKE ONE TABLET BY MOUTH AT BEDTIME   rosuvastatin (CRESTOR) 40 mg, Oral, Every evening   solifenacin (VESICARE) 10 MG tablet TAKE ONE TABLET BY MOUTH ONCE DAILY **NEED OFFICE VISIT** FOR FURTHER REFILLS   traZODone (DESYREL) 50 MG tablet take 2-4 tablets at bedtime as needed for insomnia.    Diet Orders (From admission, onward)     Start     Ordered   07/06/23 2036  Diet Heart Room service appropriate? Yes; Fluid consistency: Thin  Diet effective now       Question Answer Comment  Room service appropriate? Yes   Fluid consistency: Thin      07/06/23 2036            DVT prophylaxis: enoxaparin (LOVENOX) injection 40 mg Start: 07/06/23 2200   Lab Results  Component Value Date   PLT 259 07/13/2023      Code Status: DNR  Family Communication: no family at bedside   Status is: Inpatient Remains inpatient appropriate because: severity of illness  Level of care: Stepdown  Consultants:  PCCM  Objective: Vitals:   07/13/23 0747 07/13/23 0750 07/13/23 0831 07/13/23 0954  BP:      Pulse:    72  Resp:      Temp:   97.7 F (36.5 C)   TempSrc:   Oral   SpO2: 98% 100%    Weight:      Height:        Intake/Output Summary (Last 24 hours) at 07/13/2023 1031 Last data filed at 07/12/2023 2245 Gross per 24 hour  Intake --  Output 1250 ml  Net -1250 ml   Wt Readings from Last 3 Encounters:  07/06/23 67.5 kg  07/02/23 67.6 kg  06/11/23 67.1 kg    Examination:  Constitutional: NAD Eyes: lids and conjunctivae normal, no scleral icterus ENMT: mmm Neck: normal, supple Respiratory: Bilateral gurgling sounds, upper airway source likely Cardiovascular: Regular rate and rhythm, no murmurs / rubs / gallops. No LE edema. Abdomen: soft, no distention, no tenderness. Bowel sounds positive.   Data Reviewed: I have independently reviewed following labs and imaging studies   CBC Recent Labs  Lab 07/06/23 1324 07/07/23 0301 07/10/23 0326  07/11/23 0335 07/12/23 1037 07/13/23 0306  WBC 7.5 8.9 13.9* 11.3* 10.0 13.1*  HGB 10.7* 12.7 12.1 12.8 12.5 12.8  HCT 33.6* 40.2 38.0 40.1 39.9 40.8  PLT 177 213 226 243 228 259  MCV 90.1 89.7 90.3 89.3 90.5 90.1  MCH 28.7 28.3 28.7 28.5 28.3 28.3  MCHC 31.8 31.6 31.8 31.9 31.3 31.4  RDW 13.2 13.0 13.1 13.2 13.2 13.1  LYMPHSABS 1.4  --   --   --   --   --   MONOABS 0.7  --   --   --   --   --   EOSABS 0.3  --   --   --   --   --   BASOSABS 0.0  --   --   --   --   --     Recent Labs  Lab 07/06/23 1324 07/07/23 0301 07/10/23 0326 07/11/23 0335 07/12/23 1037 07/13/23 0306  NA 138 138 137 136  --  137  K 3.3* 3.9 3.4* 3.8  --  4.4  CL 106 103 102 99  --  101  CO2 23 23 26 30   --  26  GLUCOSE 91 160* 118* 114*  --  181*  BUN 14 13 20 22   --  25*  CREATININE 0.69 0.73 0.81 1.01*  --  0.81  CALCIUM 8.1* 9.0 8.2* 8.5*  --  8.6*  AST 16  --  17 19  --  18  ALT 11  --  16 17  --  21  ALKPHOS 65  --  59 60  --  54  BILITOT 0.7  --  0.7 0.5  --  0.7  ALBUMIN 3.4*  --  3.2* 3.2*  --  3.4*  MG  --  2.3 2.3 2.6*  --  2.5*  PROCALCITON  --   --   --   --  <0.10  --   BNP 83.4  --   --   --   --   --     ------------------------------------------------------------------------------------------------------------------ No results for input(s): "CHOL", "HDL", "LDLCALC", "TRIG", "CHOLHDL", "LDLDIRECT" in the last 72 hours.  Lab Results  Component Value Date   HGBA1C 5.3 02/27/2022   ------------------------------------------------------------------------------------------------------------------ No results for input(s): "TSH", "T4TOTAL", "T3FREE", "THYROIDAB" in the last 72 hours.  Invalid input(s): "FREET3"  Cardiac Enzymes No results for input(s): "CKMB", "TROPONINI", "MYOGLOBIN" in the last 168 hours.  Invalid input(s): "CK" ------------------------------------------------------------------------------------------------------------------    Component Value Date/Time    BNP 83.4 07/06/2023  1324   BNP 19.9 10/27/2015 1602    CBG: No results for input(s): "GLUCAP" in the last 168 hours.  Recent Results (from the past 240 hour(s))  MRSA Next Gen by PCR, Nasal     Status: None   Collection Time: 07/06/23  9:48 PM   Specimen: Nasal Mucosa; Nasal Swab  Result Value Ref Range Status   MRSA by PCR Next Gen NOT DETECTED NOT DETECTED Final    Comment: (NOTE) The GeneXpert MRSA Assay (FDA approved for NASAL specimens only), is one component of a comprehensive MRSA colonization surveillance program. It is not intended to diagnose MRSA infection nor to guide or monitor treatment for MRSA infections. Test performance is not FDA approved in patients less than 88 years old. Performed at [facility], [address] [place], [place] [address]   Respiratory (~20 pathogens) panel by PCR     Status: None   Collection Time: 07/10/23  8:03 AM   Specimen: Nasopharyngeal Swab; Respiratory  Result Value Ref Range Status   Adenovirus NOT DETECTED NOT DETECTED Final   Coronavirus 229E NOT DETECTED NOT DETECTED Final    Comment: (NOTE) The Coronavirus on the Respiratory Panel, DOES NOT test for the novel  Coronavirus (2019 nCoV)    Coronavirus HKU1 NOT DETECTED NOT DETECTED Final   Coronavirus NL63 NOT DETECTED NOT DETECTED Final   Coronavirus OC43 NOT DETECTED NOT DETECTED Final   Metapneumovirus NOT DETECTED NOT DETECTED Final   Rhinovirus / Enterovirus NOT DETECTED NOT DETECTED Final   Influenza A NOT DETECTED NOT DETECTED Final   Influenza B NOT DETECTED NOT DETECTED Final   Parainfluenza Virus 1 NOT DETECTED NOT DETECTED Final   Parainfluenza Virus 2 NOT DETECTED NOT DETECTED Final   Parainfluenza Virus 3 NOT DETECTED NOT DETECTED Final   Parainfluenza Virus 4 NOT DETECTED NOT DETECTED Final   Respiratory Syncytial Virus NOT DETECTED NOT DETECTED Final   Bordetella pertussis NOT DETECTED NOT DETECTED Final   Bordetella Parapertussis NOT  DETECTED NOT DETECTED Final   Chlamydophila pneumoniae NOT DETECTED NOT DETECTED Final   Mycoplasma pneumoniae NOT DETECTED NOT DETECTED Final    Comment: Performed at [facility], [address] [place], [place] [address]  SARS Coronavirus 2 by RT PCR (hospital order, performed in [facility] hospital lab) *cepheid single result test* Anterior Nasal Swab     Status: None   Collection Time: 07/10/23  8:03 AM   Specimen: Anterior Nasal Swab  Result Value Ref Range Status   SARS Coronavirus 2 by RT PCR NEGATIVE NEGATIVE Final    Comment: (NOTE) SARS-CoV-2 target nucleic acids are NOT DETECTED.  The SARS-CoV-2 RNA is generally detectable in upper and lower respiratory specimens during the acute phase of infection. The lowest concentration of SARS-CoV-2 viral copies this assay can detect is 250 copies / mL. A negative result does not preclude SARS-CoV-2 infection and should not be used as the sole basis for treatment or other patient management decisions.  A negative result may occur with improper specimen collection / handling, submission of specimen other than nasopharyngeal swab, presence of viral mutation(s) within the areas targeted by this assay, and inadequate number of viral copies (<250 copies / mL). A negative result must be combined with clinical observations, patient history, and epidemiological information.  Fact Sheet for Patients:   RoadLapTop.co.za  Fact Sheet for Healthcare Providers: http://kim-miller.com/  This test is not yet approved or  cleared by the [place] FDA and has been  authorized for detection and/or diagnosis of SARS-CoV-2 by FDA under an Emergency Use Authorization (EUA).  This EUA will remain in effect (meaning this test can be used) for the duration of the COVID-19 declaration under Section 564(b)(1) of the Act, 21 U.S.C. section 360bbb-3(b)(1), unless the authorization is terminated or revoked  sooner.  Performed at [facility], [address] [place], [place] [address]      Radiology Studies: CT MAXILLOFACIAL WO CONTRAST  Result Date: 07/12/2023 CLINICAL DATA:  Concern for sinusitis EXAM: CT MAXILLOFACIAL WITHOUT CONTRAST TECHNIQUE: Multidetector CT imaging of the maxillofacial structures was performed. Multiplanar CT image reconstructions were also generated. RADIATION DOSE REDUCTION: This exam was performed according to the departmental dose-optimization program which includes automated exposure control, adjustment of the mA and/or kV according to patient size and/or use of iterative reconstruction technique. COMPARISON:  None Available. FINDINGS: Osseous: No fracture or mandibular dislocation. No destructive process. Orbits: Negative. No traumatic or inflammatory finding. Sinuses: There is mucosal thickening of the maxillary sinuses, sphenoid sinuses and left anterior ethmoid air cells. There is a small air-fluid level in the right sphenoid sinus. Mastoid air cells are clear. There is no osseous wall thickening or destructive process. Soft tissues: Negative. Limited intracranial: No significant or unexpected finding. IMPRESSION: Paranasal sinus disease with small air-fluid level in the right sphenoid sinus, consistent with acute sinusitis. Electronically Signed   By: Darliss Cheney M.D.   On: 07/12/2023 23:29     Pamella Pert, MD, PhD [facility]  Between 7 am - 7 pm I am available, please contact me via Amion (for emergencies) or Securechat (non urgent messages)  Between 7 pm - 7 am I am not available, please contact night coverage MD/APP via Amion

## 2023-07-13 NOTE — Progress Notes (Signed)
NAME:  Linda [name], MRN:  [id number], DOB:  [birth year], LOS: 7 ADMISSION DATE:  07/06/2023, CONSULTATION DATE: 07/06/2023 REFERRING MD: Dr. Loney Loh, CHIEF COMPLAINT: Respiratory distress  History of Present Illness:  77 yo female with hx of asbestos exposure and pleural plaques, chronic respiratory failure with hypoxia, and vocal cord dysfunction started having trouble with her breathing about 6 weeks ago. She has cough and throat irritation. Gets a rattling in her throat. Gets anxious when she has trouble with her breathing. Nebulizer treatments helps a little.   Pertinent  Medical History  Upper GI bleeding, Anxiety, Vertigo, CAD, HFpEF, Back pain, [name], Diverticulosis, Fibromyalgia, Hiatal hernia, Pneumonia, HTN, Idiopathic urticaria, Insomnia, IDA, Migraine headache, Nephrolithiasis, OSA, Pernicious anemia, PE 2014, Pyelonephritis, RBBB, Depression  Studies:  CT chest 07/06/23 >> heavy bilateral pleural calcifications with associated nodular pleural and subpleural thickening most prominent in Lt lower lobe  Interim History / Subjective:  Still coughing. Not SOB except that she can't control her cough.   Objective   Blood pressure (!) 177/65, pulse (!) 59, temperature 97.7 F (36.5 C), temperature source Oral, resp. rate 14, height 5\' 4"  (1.626 m), weight 67.5 kg, SpO2 95%.        Intake/Output Summary (Last 24 hours) at 07/13/2023 0709 Last data filed at 07/12/2023 2245 Gross per 24 hour  Intake --  Output 1250 ml  Net -1250 ml   Filed Weights   07/06/23 1500 07/06/23 2145  Weight: 67.6 kg 67.5 kg    Examination:  General  frail elderly woman lying in bed in NAD HENT [place]/AT, eyes anicteric Pulm persistent mild wheezing, weak cough frequently when talking, better when sitting still.  Card S1S2, RRR Abd soft, NT Ext no cyanosis or edema Neuro awake and alert, answering questions appropriately, moving all extremities  Ct maxillofacial: sphenoid sinusitis, small volume  of secretions in right maxillary sinus  WBC 13.1 H/H 12.8/40.8 BUN 25 Cr 0.81    Assessment & Plan:   Acute respiratory failure with hypoxia due to acute asthma exacerbation triggered by sinusitis- has a chronic history of asbestosis and pleural plaques, but hypoxia seems disproportionate to the parenchymal and pleural abnormalities on her CT. She is wheezing on exam that could explain some of it.  Subacute onset over 7 weeks is worrisome for post-viral syndrome.  -CT scan of the sinuses reviewed with the patient-starting prolonged course of antibiotics.  Previous 5-day course was not effective.  She has tolerated cephalosporins in the past but has an allergy to penicillins-planning to start with ceftriaxone and monitor clinical response - Wean supplemental oxygen as able - Continue symptom management-Mucinex, cough medicines - Continue triple inhaled therapy - Continue steroids every 12h - GERD management  Acute to subacute cough, throat irritation and respiratory distress due to VCD vs Laryngopharyngeal reflux disease (LPR) . ENT confirmed no VC pathology.  Likely due to sphenoid sinusitis. -appreciate ENT's assistance -Continue intranasal Flonase, saline, azelastine - Steroids - Cough suppressant - Continue Singulair and start daily Claritin -Cough suppressant with Tessalon Gabapentin for symptom management, but this is likely to remain uncontrolled until her wheezing is improved. -IgE never checked and unfortunately unlikely to be helpful on steroids -Can do PPI and reflux precautions -can con't nystatin swish & swallow since on steroid nebs Pulmonary hygiene - Collect sputum culture if able   History of obstructive sleep apnea. -Needs an outpatient polysomnogram, but would not obtain this until she expectorant  Asbestos related pleural plaques, pulmonary asbesosis -needs follow up imaging,  but would not get PET with concern for acute infection right now  Anxiety,  Depression, Chronic pain. -Continue PTA antianxiety meds   Steffanie Dunn, DO 07/13/23 1:12 PM [facility]  For contact information, see Amion. If no response to pager, please call PCCM consult pager. After hours, 7PM- 7AM, please call Elink.

## 2023-07-13 NOTE — Plan of Care (Signed)
  Problem: Education: Goal: Knowledge of General Education information will improve Description: Including pain rating scale, medication(s)/side effects and non-pharmacologic comfort measures 07/13/2023 1855 by Peggye Ley, RN Outcome: Progressing 07/13/2023 1854 by Peggye Ley, RN Outcome: Progressing   Problem: Health Behavior/Discharge Planning: Goal: Ability to manage health-related needs will improve 07/13/2023 1855 by Peggye Ley, RN Outcome: Progressing 07/13/2023 1854 by Peggye Ley, RN Outcome: Progressing   Problem: Clinical Measurements: Goal: Ability to maintain clinical measurements within normal limits will improve 07/13/2023 1855 by Peggye Ley, RN Outcome: Progressing 07/13/2023 1854 by Peggye Ley, RN Outcome: Progressing Goal: Will remain free from infection 07/13/2023 1855 by Peggye Ley, RN Outcome: Progressing 07/13/2023 1854 by Peggye Ley, RN Outcome: Progressing Goal: Diagnostic test results will improve 07/13/2023 1855 by Peggye Ley, RN Outcome: Progressing 07/13/2023 1854 by Peggye Ley, RN Outcome: Progressing Goal: Respiratory complications will improve 07/13/2023 1855 by Peggye Ley, RN Outcome: Progressing 07/13/2023 1854 by Peggye Ley, RN Outcome: Progressing Goal: Cardiovascular complication will be avoided 07/13/2023 1855 by Peggye Ley, RN Outcome: Progressing 07/13/2023 1854 by Peggye Ley, RN Outcome: Progressing   Problem: Activity: Goal: Risk for activity intolerance will decrease 07/13/2023 1855 by Peggye Ley, RN Outcome: Progressing 07/13/2023 1854 by Peggye Ley, RN Outcome: Progressing   Problem: Nutrition: Goal: Adequate nutrition will be maintained 07/13/2023 1855 by Peggye Ley, RN Outcome: Progressing 07/13/2023 1854 by Peggye Ley, RN Outcome: Progressing   Problem: Pain Managment: Goal: General experience of comfort will  improve 07/13/2023 1855 by Peggye Ley, RN Outcome: Progressing 07/13/2023 1854 by Peggye Ley, RN Outcome: Progressing   Problem: Skin Integrity: Goal: Risk for impaired skin integrity will decrease 07/13/2023 1855 by Peggye Ley, RN Outcome: Progressing 07/13/2023 1854 by Peggye Ley, RN Outcome: Progressing

## 2023-07-13 NOTE — Progress Notes (Signed)
Patient seems Pharmacologist by Information systems manager. Patient shouted at RN, saying she is wet and needs her medication early.Patient states that she does not have to use the call bell for staff and does not think this writer knows how to care for her during physical assessment. Patient is alert and oriented x4, Writer explained to patient , that she should use her call bell, to notify staff of her needs and incontinence. Writer assisted changing bedding and bath. Call bell in place, will assess medication orders.  Writer was called by Diplomatic Services operational officer to check on the patient, Within 30 seconds Writer was in the room. Patient was in bed, postured sitting up, leaning forward, coughing heavily, with water, reddened eyes. Patient said "I got it out, my son gave me the heimlich". Patient said it was a little piece of meat, which later she said it was pineapple. I did not see any food in her mouth. She continues to cough and clear her airway. VS were stable, 02 97%. Patient was tachypneic, but resumed to baseline breathing within a minute, while I was in the room.   Patient agitation continues throughout the day, mouthcare, bath, and hygiene measures provided. Patient states pain is uncontrolled, MD informed, see new orders.  Patient DIL at bedside trying to comfort patient but it is not successful, patient still very agitated. Will continue to monitor.

## 2023-07-14 ENCOUNTER — Telehealth: Payer: Self-pay | Admitting: Acute Care

## 2023-07-14 DIAGNOSIS — J0130 Acute sphenoidal sinusitis, unspecified: Secondary | ICD-10-CM

## 2023-07-14 DIAGNOSIS — J4541 Moderate persistent asthma with (acute) exacerbation: Secondary | ICD-10-CM | POA: Diagnosis not present

## 2023-07-14 DIAGNOSIS — B9689 Other specified bacterial agents as the cause of diseases classified elsewhere: Secondary | ICD-10-CM

## 2023-07-14 DIAGNOSIS — J019 Acute sinusitis, unspecified: Secondary | ICD-10-CM | POA: Diagnosis not present

## 2023-07-14 MED ORDER — OXYCODONE-ACETAMINOPHEN 5-325 MG PO TABS
1.0000 | ORAL_TABLET | ORAL | Status: DC | PRN
  Administered 2023-07-15 – 2023-07-21 (×33): 1 via ORAL
  Filled 2023-07-14 (×34): qty 1

## 2023-07-14 MED ORDER — OXYCODONE-ACETAMINOPHEN 10-325 MG PO TABS: 1.0000 | ORAL_TABLET | ORAL | Status: DC | PRN

## 2023-07-14 MED ORDER — ORAL CARE MOUTH RINSE: 15.0000 mL | OROMUCOSAL | Status: DC | PRN

## 2023-07-14 MED ORDER — OXYCODONE HCL 5 MG PO TABS
5.0000 mg | ORAL_TABLET | ORAL | Status: DC | PRN
  Administered 2023-07-15 – 2023-07-21 (×33): 5 mg via ORAL
  Filled 2023-07-14 (×33): qty 1

## 2023-07-14 MED ORDER — OXYCODONE HCL 5 MG PO TABS
5.0000 mg | ORAL_TABLET | Freq: Once | ORAL | Status: AC | PRN
  Administered 2023-07-14: 10 mg via ORAL
  Filled 2023-07-14: qty 2

## 2023-07-14 MED ORDER — SODIUM CHLORIDE 0.9 % IV SOLN: INTRAVENOUS | Status: DC | PRN

## 2023-07-14 MED ORDER — METHYLPREDNISOLONE SODIUM SUCC 40 MG IJ SOLR
40.0000 mg | Freq: Every day | INTRAMUSCULAR | Status: DC
  Administered 2023-07-15 – 2023-07-18 (×4): 40 mg via INTRAVENOUS
  Filled 2023-07-14 (×4): qty 1

## 2023-07-14 MED ORDER — BENZONATATE 100 MG PO CAPS
200.0000 mg | ORAL_CAPSULE | Freq: Three times a day (TID) | ORAL | Status: DC | PRN
  Administered 2023-07-15 – 2023-07-16 (×2): 200 mg via ORAL
  Filled 2023-07-14 (×2): qty 2

## 2023-07-14 MED ORDER — OXYCODONE-ACETAMINOPHEN 5-325 MG PO TABS
2.0000 | ORAL_TABLET | ORAL | Status: DC | PRN
  Administered 2023-07-14 (×3): 2 via ORAL
  Filled 2023-07-14 (×3): qty 2

## 2023-07-14 NOTE — Evaluation (Signed)
Clinical/Bedside Swallow Evaluation Patient Details  Name: Linda [name] MRN: [id number] Date of Birth: 12/29/1945  Today's Date: 07/14/2023 Time: SLP Start Time (ACUTE ONLY): 5638 SLP Stop Time (ACUTE ONLY): 0920 SLP Time Calculation (min) (ACUTE ONLY): 15 min  Past Medical History:  Past Medical History:  Diagnosis Date   Acute upper GI bleed 06/2014   while pt taking coumadin, plavix, and meloxicam---despite being told not to take coumadin.   Anginal pain (HCC)    Nonobstructive CAD 2014; however, her cardiologist put her on a statin for this and NOT for hyperlipidemia per pt report.  Atyp CP 08/2017 at card f/u, plan for myoc perf imaging.   Anxiety    panic attacks   BPPV (benign paroxysmal positional vertigo) 12/16/2012   CAD (coronary artery disease)    Nonobstructive ([facility])   Chronic diastolic CHF (congestive heart failure) (HCC)    dry wt as of 11/06/16 is 168 lbs.   Chronic lower back pain    DDD (degenerative disc disease)    lumbar and cervical.    Diverticular disease    Fibromyalgia    Patient states dx was around her late 10s but she had sx's for years prior to this.   H/O hiatal hernia    History of pneumonia    hospitalized 12/2011, 02/2013, and 07/2013 [facility]) for this   HTN (hypertension)    Renal artery dopplers 04/2013 neg for stenosis.   Hypervitaminosis D 09/27/2019   over-supplemented.  Stopped vit D and plan recheck 2 mo.   Idiopathic angio-edema-urticaria 75643; 2021   Angioedema component was very minimal.  2021->Dr. Bobbitt (allergist) eval.   Insomnia    Iron deficiency anemia    Hematologist in [place], [place] did extensive w/u; no cause found; failed oral supplement;; gets fairly regular (q78m or so) IV iron infusions (Venofer -iron sucrose- 200mg  with procrit.  "for 14 yr I've been getting blood work q month & getting infusions prn" (07/12/2013).  Dr. Myna Hidalgo locally, iron infusions done, EPO deficiency dx'd   Migraine syndrome    "not as  often anymore; used to be ~ q wk" (07/12/2013)   Mixed incontinence urge and stress    Nephrolithiasis    "passed all on my own or they are still in there" (07/12/2013)   Neuroleptic induced parkinsonism (HCC) 2018   Dr. Arbutus Leas, neuro, saw her 11/24/17 and recommended d/c of abilify as first step.  D/c'd abilify and pt got complete recovery.   Oropharyngeal dysphagia    swallowing study speech path 05/2020. Gastric bx's showed gastritis, h pylori NEG   OSA on CPAP    prior to move to [place]--had another sleep study 10/2015 w/pulm Dr. Su Monks.   Osteoarthritis    "severe; progressing fast" (07/12/2013); multiple joints-not surgical candidate for TKR (03/2015).  Triamcinolon knee injections by Dr. Hermelinda Medicus 12/2017.   Pernicious anemia 08/24/2014   Pleural plaque with presence of asbestos 07/22/2013   Pulmonary embolism (HCC) 07/2013   Dx at [facility] with very small peripheral upper lobe pe 07/2013: pt took coumadin for about 8-9 mo   Pyelonephritis    "several times over the last 30 yr" (07/12/2013)   RBBB (right bundle branch block)    Recurrent major depression (HCC)    Recurrent UTI    hx of hospitalization for pyelonephritis; started abx prophylaxis 06/2015   Syncope    Hypotensive; ED visit--Dr. Sharyn Lull did Cath--nonobstructive CAD, EF 55-60%.  In retrospect, suspect pt rx med misuse/polypharmacy   Vertebral compression fracture (  HCC)    Acute T12 on 02/27/22 (fall).  multiple old thoracic-->neurosurg to do MRI as of 05/2022   Past Surgical History:  Past Surgical History:  Procedure Laterality Date   APPENDECTOMY  1960   AXILLARY SURGERY Left 1978   Multiple "lump" in armpit per pt   BIOPSY  06/17/2020   Gastric bx->gastritis, h pylori neg.  Procedure: BIOPSY;  Surgeon: Jeani Hawking, MD;  Location: [facility];  Service: Endoscopy;;   CARDIAC CATHETERIZATION  01/2013   nonobstructive CAD, EF 55-60%   CARDIOVASCULAR STRESS TEST  02/22/2015   Low risk myocard perf imaging; wall motion normal, normal EF    CARDIOVASCULAR STRESS TEST     09/2017 myo perf low risk   carotid duplex doppler  10/21/2017   01/2023 1-30% bilat-->rpt 1 yr   COCCYX REMOVAL  1972   CT CTA CORONARY W/CA SCORE W/CM &/OR WO/CM     04/02/23 96%'tile calcium score, nonobst CAD-->crestor increased to 40   DEXA  06/05/2017   T-score -3.1   DILATION AND CURETTAGE OF UTERUS  ? 1970's   ESOPHAGOGASTRODUODENOSCOPY N/A 07/19/2014   Gastritis found + in the setting of supratherapeutic INR, +plavix, + meloxicam.   ESOPHAGOGASTRODUODENOSCOPY (EGD) WITH PROPOFOL N/A 06/17/2020   NO stricture or other prob to explain pt's dysphagia, dilation was done anyway.  Gastric bx's-->gastritis, h pylori neg. Procedure: ESOPHAGOGASTRODUODENOSCOPY (EGD) WITH PROPOFOL;  Surgeon: Jeani Hawking, MD;  Location: [facility];  Service: Endoscopy;  Laterality: N/A;   EYE SURGERY Left 2012-2013   "injections for ~ 1 yr; don't really know what for" (07/12/2013)   HEEL SPUR SURGERY Left 2008   kidney stone removal Right    KNEE SURGERY  2005   LEFT HEART CATHETERIZATION WITH CORONARY ANGIOGRAM N/A 01/30/2013   Procedure: LEFT HEART CATHETERIZATION WITH CORONARY ANGIOGRAM;  Surgeon: Robynn Pane, MD;  Location: [facility];  Service: Cardiovascular;  Laterality: N/A;   MALONEY DILATION  06/17/2020   Procedure: Elease Hashimoto DILATION;  Surgeon: Jeani Hawking, MD;  Location: [facility];  Service: Endoscopy;;   PLANTAR FASCIA RELEASE Left 2008   SPIROMETRY  04/25/2014   In hosp for acute asthma/COPD flare: mixed obstructive and restrictive lung disease. The FEV1 is severely reduced at 45% predicted.  FEV1 signif decreased compared to prior spirometry 07/23/13.   TENDON RELEASE  1996   Right forearm and hand   TOTAL ABDOMINAL HYSTERECTOMY  1974   TRANSTHORACIC ECHOCARDIOGRAM  01/2013; 04/2014;08/2015; 09/2017   2014--NORMAL.  2015--focal basal septal hypertrophy, EF 55-60%, grade I diast dysfxn, mild LAE.  08/2015 EF 55-60%, nl LV syst fxn, grade I DD, valves wnl.  10/21/17: EF 65-70%, grd I DD, o/w normal. 02/17/22 EF 70-75%, hyperdynamic LV fxn, grd I DD.   HPI:  Per hospitalist note "77 y.o. female with medical history significant of pulmonary asbestosis, chronic hypoxemic respiratory failure on 3 L home oxygen, vocal cord dysfunction, OSA, chronic HFpEF, history of PE, chronic pain, anxiety/depression, nonobstructive CAD, hypertension, idiopathic angioedema-urticaria, neuroleptic induced parkinsonism presented with worsening shortness of breath.  On presentation, she was in severe respiratory distress requiring nonrebreather.  CT chest with contrast showed findings consistent with asbestos related pleural disease or posttraumatic changes.  She was started on IV steroids.  Pulmonary was consulted."  Pulmonary ordered esophagram due to pt's extensive esophageal history including esophageal stricture.  Radiologist deemed SLP evaluation needed first to assure pt could swallow lying down - therefore CCM ordered MBS - and esophagram.  Imaging of neck showed  Chronic degenerative ankylosis of the C5 and C6 vertebral bodies.  CT chest "Heavy bilateral pleural calcifications with associated linear and  nodular pleural and subpleural thickening. Most prominent nodular  thickening is seen in the left lower lobe. Findings are consistent  with asbestos related pleural disease or posttraumatic changes."  Old lacunar  infarction in the right caudate head seen on prior MRI.  RN reports pt coughing frequently but cannot differentiate if coughing could be related to aspiration during or after po. MBS completed 07/10/23 which reported penetration above vocal cords with thin liquids but no aspiration even when reclined at 45 degrees. Airway protection was adequate, pharyngeal swallow was strong without bolus retention; patient did exhibit significantly prominent cricopharyngeal bar.On 07/13/23, patient had an incident of choking on piece of pineapple which was witnessed by son and per his  report, he gave her the Heimlich maneuver. Per Dr. Chestine Spore (pulmonary DO), "She does not want to be NPO. I discussed that severe episodes of choking can lead to her heart stopping, and I recommended NPO and repeat SLP evaluation with small bore NGT for alternative nutrition in the interim. She declined."    Assessment / Plan / Recommendation  Clinical Impression  Patient's swallow function does not appear to have changed since findings on MBS (modified barium swallow study) which reported penetration but not aspiration of thin liquids. Patient with h/o esophageal dysphagia including esophageal stricture and has undergone EGD with dilation in the past. On MBS completed 7/18, significantly prominent cricopharyngeal bar was observed. SLP reviewed 2021 MBS results which reported "Pt may be at ongoing risk of food lodging in the cervical esophagus." During today's bedside swallow evaluation, patient did not exhibit any immediate s/s aspiration but did exhibit some delayed, hoarse sounding coughing after sips of thin liquids (water). Unable to differentiate between chronic coughing vs coughing from potential aspiration. She will continue to be at risk for post-prandial aspiration as well as risk of food lodging in cervical esophagus secondary to h/o esophageal dysphagia. Patient told SLP she thinks the choking incident with piece of pineapple yesterday was "a fluke" and in addition, she does not wish to have any modifications to her PO diet and does not feel there is need for repeat MBS. SLP's recommendation is for mechanical soft solids, thin liquids, however patient wishes to continue with regular solids, thin liquids. SLP to s/o at this time but if further concern regarding swallow function, recommend repeat MBS. SLP Visit Diagnosis: Dysphagia, unspecified (R13.10)    Aspiration Risk  Mild aspiration risk    Diet Recommendation Dysphagia 3 (Mech soft);Thin liquid;Other (Comment) (patient does not wish to make  any changes to diet order)    Liquid Administration via: Cup;Straw Medication Administration: Other (Comment) (as tolerated) Supervision: Patient able to self feed Postural Changes: Seated upright at 90 degrees;Remain upright for at least 30 minutes after po intake    Other  Recommendations Oral Care Recommendations: Oral care BID    Recommendations for follow up therapy are one component of a multi-disciplinary discharge planning process, led by the attending physician.  Recommendations may be updated based on patient status, additional functional criteria and insurance authorization.  Follow up Recommendations No SLP follow up      Assistance Recommended at Discharge    Functional Status Assessment Patient has not had a recent decline in their functional status  Frequency and Duration     N/A       Prognosis   N/A     Swallow  Study   General Date of Onset: 07/13/23 HPI: Per hospitalist note "77 y.o. female with medical history significant of pulmonary asbestosis, chronic hypoxemic respiratory failure on 3 L home oxygen, vocal cord dysfunction, OSA, chronic HFpEF, history of PE, chronic pain, anxiety/depression, nonobstructive CAD, hypertension, idiopathic angioedema-urticaria, neuroleptic induced parkinsonism presented with worsening shortness of breath.  On presentation, she was in severe respiratory distress requiring nonrebreather.  CT chest with contrast showed findings consistent with asbestos related pleural disease or posttraumatic changes.  She was started on IV steroids.  Pulmonary was consulted."  Pulmonary ordered esophagram due to pt's extensive esophageal history including esophageal stricture.  Radiologist deemed SLP evaluation needed first to assure pt could swallow lying down - therefore CCM ordered MBS - and esophagram.  Imaging of neck showed  Chronic degenerative ankylosis of the C5 and C6 vertebral bodies.  CT chest "Heavy bilateral pleural calcifications with  associated linear and  nodular pleural and subpleural thickening. Most prominent nodular  thickening is seen in the left lower lobe. Findings are consistent  with asbestos related pleural disease or posttraumatic changes."  Old lacunar  infarction in the right caudate head seen on prior MRI.  RN reports pt coughing frequently but cannot differentiate if coughing could be related to aspiration during or after po. MBS completed 07/10/23 which reported penetration above vocal cords with thin liquids but no aspiration even when reclined at 45 degrees. Airway protection was adequate, pharyngeal swallow was strong without bolus retention; patient did exhibit significantly prominent cricopharyngeal bar.On 07/13/23, patient had an incident of choking on piece of pineapple which was witnessed by son and per his report, he gave her the Heimlich maneuver. Per Dr. Chestine Spore (pulmonary DO), "She does not want to be NPO. I discussed that severe episodes of choking can lead to her heart stopping, and I recommended NPO and repeat SLP evaluation with small bore NGT for alternative nutrition in the interim. She declined." Type of Study: Bedside Swallow Evaluation Previous Swallow Assessment: MBS on 07/10/23 Diet Prior to this Study: Regular;Thin liquids (Level 0) Temperature Spikes Noted: No Respiratory Status: Nasal cannula History of Recent Intubation: No Behavior/Cognition: Alert;Cooperative;Pleasant mood Oral Cavity Assessment: Within Functional Limits Oral Care Completed by SLP: No Oral Cavity - Dentition: Dentures, top;Other (Comment) (lower dentition present, patient wearing upper dentures) Vision: Functional for self-feeding Self-Feeding Abilities: Able to feed self Patient Positioning: Upright in bed Baseline Vocal Quality: Hoarse Volitional Cough: Strong Volitional Swallow: Able to elicit    Oral/Motor/Sensory Function Overall Oral Motor/Sensory Function: Within functional limits   Ice Chips     Thin Liquid  Thin Liquid: Impaired Presentation: Self Fed;Straw Pharyngeal  Phase Impairments: Cough - Delayed    Nectar Thick Nectar Thick Liquid: Not tested   Honey Thick     Puree Puree: Not tested   Solid     Solid: Not tested      Angela Nevin, MA, CCC-SLP Speech Therapy

## 2023-07-14 NOTE — Progress Notes (Signed)
PROGRESS NOTE  Linda [name] ZOX:[id number] DOB: 1946/03/21 DOA: 07/06/2023 PCP: Jeoffrey Massed, MD   LOS: 8 days   Brief Narrative / Interim history: 77 y.o. female with medical history significant of pulmonary asbestosis, chronic hypoxemic respiratory failure on 3 L home oxygen, vocal cord dysfunction, OSA, chronic HFpEF, history of PE, chronic pain, anxiety/depression, nonobstructive CAD, hypertension, idiopathic angioedema-urticaria, neuroleptic induced parkinsonism presented with worsening shortness of breath.  On presentation, she was in severe respiratory distress requiring nonrebreather.  CT chest with contrast showed findings consistent with asbestos related pleural disease or posttraumatic changes.  She was started on IV steroids.  Pulmonary was consulted.   Subjective / 24h Interval events: She is finally feeling a little bit better this morning.  Voice seems better  Assesement and Plan: Principal Problem:   Acute on chronic respiratory failure (HCC) Active Problems:   Chronic pain   OSA (obstructive sleep apnea)   Chronic heart failure with preserved ejection fraction (HFpEF) (HCC)   Anxiety   Hypokalemia   CAD-minor 2014   Pulmonary asbestosis (HCC)   Vocal cord dysfunction   Subacute cough   Acute respiratory failure with hypoxia (HCC)   Acute bacterial sinusitis   Moderate persistent asthma with acute exacerbation   Principal problem Acute on chronic hypoxic respiratory failure, Vocal cord dysfunction with acute flare, Pleural plaques consistent with asbestos lung disease -Normally wears 3 L oxygen via nasal cannula.  Presented in severe respiratory distress requiring nonrebreather, finally appears to be improving. -Pulmonary consulted, appreciate input, discussed with Dr. Chestine Spore this morning -Continue steroids, nebs, -Continue anxiolytics and pain meds -Esophagogram 7/18 without strictures but could not evaluate for reflux, continue hypertonic saline.  Continue to encourage sittin in the chair throughout the day and ambulating -ENT evaluated on 7/19, no vocal cord issues -?  Postnasal drip related to sinusitis based on CT scan, started on antibiotics on 7/20.  Active problems OSA -Not on CPAP at home.  Will need outpatient sleep study   Chronic diastolic heart failure Hypertension Hyperlipidemia History of nonobstructive CAD -Compensated.  Echo in February 2023 showed EF of 70 to 75% with grade 1 diastolic dysfunction.  Strict input and output.  Daily weights.  Outpatient follow-up with cardiology.  Continue Imdur, metoprolol succinate -Continue statin -no chest pain   Chronic pain with possible opiate dependence Anxiety/depression -Continue current regimen.  Outpatient follow-up with PCP/pain management   Hypokalemia -replete and continue to monitor, potassium now normalized.  Magnesium normal/high   Physical deconditioning -Consult PT  Scheduled Meds:  ALPRAZolam  0.5 mg Oral TID   amLODipine  5 mg Oral Daily   arformoterol  15 mcg Nebulization BID   aspirin EC  81 mg Oral QHS   azelastine  1 spray Each Nare q morning   budesonide (PULMICORT) nebulizer solution  0.25 mg Nebulization BID   Chlorhexidine Gluconate Cloth  6 each Topical Daily   DULoxetine  30 mg Oral Daily   enoxaparin (LOVENOX) injection  40 mg Subcutaneous Q24H   famotidine  20 mg Oral QHS   fluticasone  1 spray Each Nare QHS   gabapentin  100 mg Oral BID   guaiFENesin  1,200 mg Oral BID   isosorbide mononitrate  60 mg Oral BID   lamoTRIgine  200 mg Oral Daily   loratadine  10 mg Oral Daily   [START ON 07/15/2023] methylPREDNISolone (SOLU-MEDROL) injection  40 mg Intravenous Daily   metoprolol succinate  100 mg Oral Daily   montelukast  10  mg Oral QHS   multivitamin with minerals  1 tablet Oral Daily   mouth rinse  15 mL Mouth Rinse 4 times per day   pantoprazole  40 mg Oral BID AC   revefenacin  175 mcg Nebulization Daily   rosuvastatin  40 mg Oral  QPM   sodium chloride  2 spray Each Nare BID   traZODone  100-200 mg Oral QHS   Continuous Infusions:  cefTRIAXone (ROCEPHIN)  IV Stopped (07/13/23 1016)   PRN Meds:.benzonatate, haloperidol lactate, hydrALAZINE, ipratropium-albuterol, methocarbamol, ondansetron (ZOFRAN) IV, mouth rinse, oxyCODONE-acetaminophen  Current Outpatient Medications  Medication Instructions   albuterol (VENTOLIN HFA) 108 (90 Base) MCG/ACT inhaler 1-2 puffs, Inhalation, Every 6 hours PRN   ALPRAZolam (XANAX) 0.5 MG tablet TAKE ONE TABLET BY MOUTH THREE TIMES DAILY   amLODipine (NORVASC) 5 mg, Oral, Daily   aspirin 81 mg, Oral, Daily at bedtime   benzonatate (TESSALON) 100 mg, Oral, 2 times daily PRN   BREZTRI AEROSPHERE 160-9-4.8 MCG/ACT AERO 2 puffs, Inhalation, 2 times daily   buprenorphine (BUTRANS) 5 MCG/HR PTWK 1 patch, Transdermal, Weekly   cyclobenzaprine (FLEXERIL) 5 mg, Oral, 2 times daily PRN   diclofenac Sodium (VOLTAREN) 2 g, Topical, 4 times daily, 4 times a day to both knees   DULoxetine (CYMBALTA) 60 mg, Oral, Daily   EPINEPHrine (EPI-PEN) 0.3 mg, Intramuscular, As needed   ipratropium (ATROVENT) 0.03 % nasal spray 2 sprays, Each Nare, Every 12 hours PRN   ipratropium-albuterol (DUONEB) 0.5-2.5 (3) MG/3ML SOLN 3 mLs, Nebulization, Every 4 hours PRN   isosorbide mononitrate (IMDUR) 60 mg, Oral, 2 times daily   lamoTRIgine (LAMICTAL) 200 mg, Oral, Daily   methocarbamol (ROBAXIN) 500 mg, Oral, Every 8 hours PRN   metoprolol succinate (TOPROL-XL) 100 MG 24 hr tablet take ONE tab daily, take additional 1/2 tab as needed FOR FOR BLOOD PRESSURE > 160/90   metoprolol tartrate (LOPRESSOR) 50 MG tablet Take 2 hours prior to CT scan   Multiple Vitamin (MULTIVITAMIN WITH MINERALS) TABS tablet 1 tablet, Oral, Daily with breakfast   nitroGLYCERIN (NITROSTAT) 0.4 mg, Sublingual, Every 5 min PRN   oxyCODONE-acetaminophen (PERCOCET) 10-325 MG tablet 1 tablet, Oral, Every 6 hours PRN   OXYGEN 3 L/min,  Inhalation, Continuous   pantoprazole (PROTONIX) 40 mg, Oral, 2 times daily   rOPINIRole (REQUIP) 0.5 MG tablet TAKE ONE TABLET BY MOUTH AT BEDTIME   rosuvastatin (CRESTOR) 40 mg, Oral, Every evening   solifenacin (VESICARE) 10 MG tablet TAKE ONE TABLET BY MOUTH ONCE DAILY **NEED OFFICE VISIT** FOR FURTHER REFILLS   traZODone (DESYREL) 50 MG tablet take 2-4 tablets at bedtime as needed for insomnia.    Diet Orders (From admission, onward)     Start     Ordered   07/06/23 2036  Diet Heart Room service appropriate? Yes; Fluid consistency: Thin  Diet effective now       Question Answer Comment  Room service appropriate? Yes   Fluid consistency: Thin      07/06/23 2036            DVT prophylaxis: enoxaparin (LOVENOX) injection 40 mg Start: 07/06/23 2200   Lab Results  Component Value Date   PLT 259 07/13/2023      Code Status: DNR  Family Communication: no family at bedside   Status is: Inpatient Remains inpatient appropriate because: severity of illness  Level of care: Stepdown  Consultants:  PCCM  Objective: Vitals:   07/14/23 0400 07/14/23 0605 07/14/23 0753 07/14/23 0933  BP:  [name] Kitchen)  140/63  (!) 172/85  Pulse: (!) 56 64  64  Resp: 14 14  (!) 29  Temp: 97.6 F (36.4 C)  97.7 F (36.5 C)   TempSrc: Axillary  Oral   SpO2: 98% 96%  96%  Weight:      Height:        Intake/Output Summary (Last 24 hours) at 07/14/2023 1003 Last data filed at 07/14/2023 0856 Gross per 24 hour  Intake 360 ml  Output 3450 ml  Net -3090 ml   Wt Readings from Last 3 Encounters:  07/06/23 67.5 kg  07/02/23 67.6 kg  06/11/23 67.1 kg    Examination:  Constitutional: NAD Eyes: lids and conjunctivae normal, no scleral icterus ENMT: mmm Neck: normal, supple Respiratory: clear to auscultation bilaterally, no wheezing, no crackles. Normal respiratory effort.  Cardiovascular: Regular rate and rhythm, no murmurs / rubs / gallops. No LE edema. Abdomen: soft, no distention, no  tenderness. Bowel sounds positive.   Data Reviewed: I have independently reviewed following labs and imaging studies   CBC Recent Labs  Lab 07/10/23 0326 07/11/23 0335 07/12/23 1037 07/13/23 0306  WBC 13.9* 11.3* 10.0 13.1*  HGB 12.1 12.8 12.5 12.8  HCT 38.0 40.1 39.9 40.8  PLT 226 243 228 259  MCV 90.3 89.3 90.5 90.1  MCH 28.7 28.5 28.3 28.3  MCHC 31.8 31.9 31.3 31.4  RDW 13.1 13.2 13.2 13.1    Recent Labs  Lab 07/10/23 0326 07/11/23 0335 07/12/23 1037 07/13/23 0306  NA 137 136  --  137  K 3.4* 3.8  --  4.4  CL 102 99  --  101  CO2 26 30  --  26  GLUCOSE 118* 114*  --  181*  BUN 20 22  --  25*  CREATININE 0.81 1.01*  --  0.81  CALCIUM 8.2* 8.5*  --  8.6*  AST 17 19  --  18  ALT 16 17  --  21  ALKPHOS 59 60  --  54  BILITOT 0.7 0.5  --  0.7  ALBUMIN 3.2* 3.2*  --  3.4*  MG 2.3 2.6*  --  2.5*  PROCALCITON  --   --  <0.10  --     ------------------------------------------------------------------------------------------------------------------ No results for input(s): "CHOL", "HDL", "LDLCALC", "TRIG", "CHOLHDL", "LDLDIRECT" in the last 72 hours.  Lab Results  Component Value Date   HGBA1C 5.3 02/27/2022   ------------------------------------------------------------------------------------------------------------------ No results for input(s): "TSH", "T4TOTAL", "T3FREE", "THYROIDAB" in the last 72 hours.  Invalid input(s): "FREET3"  Cardiac Enzymes No results for input(s): "CKMB", "TROPONINI", "MYOGLOBIN" in the last 168 hours.  Invalid input(s): "CK" ------------------------------------------------------------------------------------------------------------------    Component Value Date/Time   BNP 83.4 07/06/2023 1324   BNP 19.9 10/27/2015 1602    CBG: No results for input(s): "GLUCAP" in the last 168 hours.  Recent Results (from the past 240 hour(s))  MRSA Next Gen by PCR, Nasal     Status: None   Collection Time: 07/06/23  9:48 PM   Specimen:  Nasal Mucosa; Nasal Swab  Result Value Ref Range Status   MRSA by PCR Next Gen NOT DETECTED NOT DETECTED Final    Comment: (NOTE) The GeneXpert MRSA Assay (FDA approved for NASAL specimens only), is one component of a comprehensive MRSA colonization surveillance program. It is not intended to diagnose MRSA infection nor to guide or monitor treatment for MRSA infections. Test performance is not FDA approved in patients less than 54 years old. Performed at [facility], [address] [place], [place] [address]   Respiratory (~20 pathogens) panel by PCR     Status: None   Collection Time: 07/10/23  8:03 AM   Specimen: Nasopharyngeal Swab; Respiratory  Result Value Ref Range Status   Adenovirus NOT DETECTED NOT DETECTED Final   Coronavirus 229E NOT DETECTED NOT DETECTED Final    Comment: (NOTE) The Coronavirus on the Respiratory Panel, DOES NOT test for the novel  Coronavirus (2019 nCoV)    Coronavirus HKU1 NOT DETECTED NOT DETECTED Final   Coronavirus NL63 NOT DETECTED NOT DETECTED Final   Coronavirus OC43 NOT DETECTED NOT DETECTED Final   Metapneumovirus NOT DETECTED NOT DETECTED Final   Rhinovirus / Enterovirus NOT DETECTED NOT DETECTED Final   Influenza A NOT DETECTED NOT DETECTED Final   Influenza B NOT DETECTED NOT DETECTED Final   Parainfluenza Virus 1 NOT DETECTED NOT DETECTED Final   Parainfluenza Virus 2 NOT DETECTED NOT DETECTED Final   Parainfluenza Virus 3 NOT DETECTED NOT DETECTED Final   Parainfluenza Virus 4 NOT DETECTED NOT DETECTED Final   Respiratory Syncytial Virus NOT DETECTED NOT DETECTED Final   Bordetella pertussis NOT DETECTED NOT DETECTED Final   Bordetella Parapertussis NOT DETECTED NOT DETECTED Final   Chlamydophila pneumoniae NOT DETECTED NOT DETECTED Final   Mycoplasma pneumoniae NOT DETECTED NOT DETECTED Final    Comment: Performed at [facility], [address] [place], [place] [address]  SARS Coronavirus 2 by RT PCR  (hospital order, performed in [facility] hospital lab) *cepheid single result test* Anterior Nasal Swab     Status: None   Collection Time: 07/10/23  8:03 AM   Specimen: Anterior Nasal Swab  Result Value Ref Range Status   SARS Coronavirus 2 by RT PCR NEGATIVE NEGATIVE Final    Comment: (NOTE) SARS-CoV-2 target nucleic acids are NOT DETECTED.  The SARS-CoV-2 RNA is generally detectable in upper and lower respiratory specimens during the acute phase of infection. The lowest concentration of SARS-CoV-2 viral copies this assay can detect is 250 copies / mL. A negative result does not preclude SARS-CoV-2 infection and should not be used as the sole basis for treatment or other patient management decisions.  A negative result may occur with improper specimen collection / handling, submission of specimen other than nasopharyngeal swab, presence of viral mutation(s) within the areas targeted by this assay, and inadequate number of viral copies (<250 copies / mL). A negative result must be combined with clinical observations, patient history, and epidemiological information.  Fact Sheet for Patients:   RoadLapTop.co.za  Fact Sheet for Healthcare Providers: http://kim-miller.com/  This test is not yet approved or  cleared by the [place] FDA and has been authorized for detection and/or diagnosis of SARS-CoV-2 by FDA under an Emergency Use Authorization (EUA).  This EUA will remain in effect (meaning this test can be used) for the duration of the COVID-19 declaration under Section 564(b)(1) of the Act, 21 U.S.C. section 360bbb-3(b)(1), unless the authorization is terminated or revoked sooner.  Performed at [facility] [address], [place], [place] [address]      Radiology Studies: No results found.   Pamella Pert, MD, PhD [facility]  Between 7 am - 7 pm I am available, please contact me via Amion  (for emergencies) or Securechat (non urgent messages)  Between 7 pm - 7 am I am not available, please contact night coverage MD/APP via Amion

## 2023-07-14 NOTE — Progress Notes (Signed)
Physical Therapy Treatment Patient Details Name: Linda [name] MRN: [id number] DOB: [birth year] Today's Date: 07/14/2023   History of Present Illness Pt admitted from [name] on 07/06/23 secondary to SOB and dx with acute on chronic respiratory failure and vocal cord dysfunction.  Pt with hx of BPPV, CHF, DDD, Fibromyalgia, RBBB, syncope, pulmonary asbestosis, and T12 compression fx    PT Comments  Pt agreeable to therapy and made good progress today.  She was able to ambulate 64' with standard walker in room and min guard.  She was on 4 L HFNC with sats initially 93% ambulation but did drop to 86% requiring increase to 6 L to complete walk before transitioning back to 4 L.  Would benefit from taking rollator for trial as that is what she uses baseline.  Updated recommendations to return to ILF/ALF with ongoing therapy.   Noted pt had c/o dizziness with nursing. Per PT report yesterday , pt reports having BPPV in past and this did not seem similiar. Today, pt reports symptoms only occur when nursing has her roll in bed to change, described as spinning, and lasting <2 mins. Occurs worst rolling left and then back on her back. Discussed symptoms do sound consistent with BPPV; however, with pt's kyphosis and current respiratory status positioning would be difficult for testing and treating. Additionally, pt reports only bothers when rolling and does not want to treat at this time.      Assistance Recommended at Discharge Intermittent Supervision/Assistance  If plan is discharge home, recommend the following:  Can travel by private vehicle    A little help with bathing/dressing/bathroom;Assistance with cooking/housework;Help with stairs or ramp for entrance;Assist for transportation;A little help with walking and/or transfers      Equipment Recommendations  None recommended by PT    Recommendations for Other Services       Precautions / Restrictions Precautions Precautions:  Fall     Mobility  Bed Mobility Overal bed mobility: Needs Assistance Bed Mobility: Sit to Supine       Sit to supine: Supervision        Transfers Overall transfer level: Needs assistance Equipment used: Standard walker Transfers: Sit to/from Stand Sit to Stand: Supervision                Ambulation/Gait Ambulation/Gait assistance: Min guard Gait Distance (Feet): 50 Feet Assistive device: Standard walker Gait Pattern/deviations: Step-to pattern, Decreased stride length Gait velocity: decreased     General Gait Details: Steady gait limited by fatigue and shortness of breath.  Pt on 4 L HFNC with sats 98% rest and initially 93% ambulation but dropped to 86% and increased to 6 L to complete walk.  Back on 4 L at end of session.   Stairs             Wheelchair Mobility     Tilt Bed    Modified Rankin (Stroke Patients Only)       Balance Overall balance assessment: Needs assistance Sitting-balance support: No upper extremity supported Sitting balance-Leahy Scale: Good     Standing balance support: Bilateral upper extremity supported, No upper extremity supported Standing balance-Leahy Scale: Fair Standing balance comment: waker to ambulate but could static stand without AD                            Cognition Arousal/Alertness: Awake/alert Behavior During Therapy: WFL for tasks assessed/performed Overall Cognitive Status: Within Functional Limits for tasks assessed  Exercises Other Exercises Other Exercises: Incentive spirometer to encourage deep breathing x10 to 750-900 mL    General Comments General comments (skin integrity, edema, etc.): Noted pt had c/o dizziness with nursing.  Per PT report yesterday , pt reports having BPPV in past and this did not seem similiar.  Today, pt reports symptoms only occur when nursing has her roll in bed to change, described as spinning,  and lasting <2 mins.  Occurs worst rolling left and then back on her back.  Discussed symptoms do sound consistent with BPPV; however, with pt's kyphosis and current respiratory status positioning would be difficult for testing and treating.  Additionally, pt reports only bothers when rolling and does not want to treat at this time.      Pertinent Vitals/Pain Pain Assessment Pain Assessment: No/denies pain    Home Living                          Prior Function            PT Goals (current goals can now be found in the care plan section) Progress towards PT goals: Progressing toward goals    Frequency    Min 1X/week      PT Plan Current plan remains appropriate    Co-evaluation              AM-PAC PT "6 Clicks" Mobility   Outcome Measure  Help needed turning from your back to your side while in a flat bed without using bedrails?: None Help needed moving from lying on your back to sitting on the side of a flat bed without using bedrails?: A Little Help needed moving to and from a bed to a chair (including a wheelchair)?: A Little Help needed standing up from a chair using your arms (e.g., wheelchair or bedside chair)?: A Little Help needed to walk in hospital room?: A Little Help needed climbing 3-5 steps with a railing? : A Little 6 Click Score: 19    End of Session Equipment Utilized During Treatment: Gait belt Activity Tolerance: Patient tolerated treatment well Patient left: in bed;with call bell/phone within reach;with bed alarm set;with family/visitor present Nurse Communication: Mobility status PT Visit Diagnosis: Difficulty in walking, not elsewhere classified (R26.2)     Time: 9604-5409 PT Time Calculation (min) (ACUTE ONLY): 28 min  Charges:    $Gait Training: 8-22 mins $Therapeutic Activity: 8-22 mins PT General Charges $$ ACUTE PT VISIT: 1 Visit                     Anise Salvo, PT Acute Rehab Services [facility]  [phone]    Rayetta Humphrey 07/14/2023, 3:36 PM

## 2023-07-14 NOTE — Progress Notes (Signed)
NAME:  Linda [name], MRN:  [id number], DOB:  Jul 09, 1946, LOS: 8 ADMISSION DATE:  07/06/2023, CONSULTATION DATE: 07/06/2023 REFERRING MD: Dr. Loney Loh, CHIEF COMPLAINT: Respiratory distress  History of Present Illness:  77 yo female with hx of asbestos exposure and pleural plaques, chronic respiratory failure with hypoxia, and vocal cord dysfunction started having trouble with her breathing about 6 weeks ago. She has cough and throat irritation. Gets a rattling in her throat. Gets anxious when she has trouble with her breathing. Nebulizer treatments helps a little.   Pertinent  Medical History  Upper GI bleeding, Anxiety, Vertigo, CAD, HFpEF, Back pain, [name], Diverticulosis, Fibromyalgia, Hiatal hernia, Pneumonia, HTN, Idiopathic urticaria, Insomnia, IDA, Migraine headache, Nephrolithiasis, OSA, Pernicious anemia, PE 2014, Pyelonephritis, RBBB, Depression  Studies:  CT chest 07/06/23 >> heavy bilateral pleural calcifications with associated nodular pleural and subpleural thickening most prominent in Lt lower lobe  Interim History / Subjective:  Better today. Still coughs when she talks but not to extent of prior days. Vocal hoarseness better   Objective   Blood pressure (Abnormal) 140/63, pulse 64, temperature 97.7 F (36.5 C), temperature source Oral, resp. rate 14, height 5\' 4"  (1.626 m), weight 67.5 kg, SpO2 96%.        Intake/Output Summary (Last 24 hours) at 07/14/2023 0919 Last data filed at 07/14/2023 0856 Gross per 24 hour  Intake 480 ml  Output 3450 ml  Net -2970 ml   Filed Weights   07/06/23 1500 07/06/23 2145  Weight: 67.6 kg 67.5 kg    Examination: General 77 year old female resting in bed. No distress HENT NCAT she still has sig upper airway noises, coughs when talks but vocal hoarseness has improved some Pulm diffuse wheeze but no accessory use. Still on [place] 4 lpm Card rrr Abd soft  Ext warm and dry  Neuro intact    Assessment & Plan:   Acute respiratory  due to asthmatic exacerbation triggered by sinusitis and further complicated by  subacute cough, throat irritation and respiratory distress due to VCD vs Laryngopharyngeal reflux disease (LPR)  She was seen by ENT who evaluated by DL and noted no vocal cord paralysis, good movement, no physical abnormalities. She does seem to finally be finally improving. I still think there is a upper airway component in addition to asthma (both would be triggered by sinusitis)  Plan Continue saline nasal, flonase and azelastine nasal therapy Cont gabapentin for cough suppression as well tessalon PRN Try to limit talking Cont Singulair and Claritin Cont steroids but decrease to 40mg /d continue protonix bid and pepcid at bedtime Reflux precautions Cont BD therapy  Vocal rest Day 2 ceftriaxone. Will prob need at least a 10 day course of abx given acute sinusitis  Get her OOB today  Cont flutter and IS  History of obstructive sleep apnea. Plan Reassess as out pt  Agitated delirium on 7/21 seems resolved as of 7/22 Anxiety, Depression, Chronic pain. Suspect that yesterday was exacerbated by mix of sleep deprivation and steroids. Got haldol yesterday. She is at baseline today.  Plan Continue her home lamictal  Cont neurontin  Cont PRN analgesia  Has Haldol PRN Decrease solumedrol to daily   Asbestos related pleural plaques. - has more prominent pleural thickening Plan will need PET scan as an outpt to assess further and then determine if she needs pleural biopsy  She is OK to transfer to medical floor. We will sign off. I will make an appointment for her to see [place] in clinic  for follow up. She is going to need PFTs minimally as an outpt once she's off steroids.   Simonne Martinet ACNP-BC [facility]/Critical Care Pager # [phone] OR # [phone] if no answer

## 2023-07-15 DIAGNOSIS — J0130 Acute sphenoidal sinusitis, unspecified: Secondary | ICD-10-CM

## 2023-07-15 DIAGNOSIS — J4541 Moderate persistent asthma with (acute) exacerbation: Secondary | ICD-10-CM | POA: Diagnosis not present

## 2023-07-15 LAB — GLUCOSE, CAPILLARY: Glucose-Capillary: 97 mg/dL (ref 70–99)

## 2023-07-15 LAB — CBC
HCT: 40.7 % (ref 36.0–46.0)
Hemoglobin: 12.8 g/dL (ref 12.0–15.0)
MCH: 28.3 pg (ref 26.0–34.0)
MCHC: 31.4 g/dL (ref 30.0–36.0)
MCV: 89.8 fL (ref 80.0–100.0)
Platelets: 236 10*3/uL (ref 150–400)
RBC: 4.53 MIL/uL (ref 3.87–5.11)
RDW: 13.5 % (ref 11.5–15.5)
WBC: 13.4 10*3/uL — ABNORMAL HIGH (ref 4.0–10.5)
nRBC: 0 % (ref 0.0–0.2)

## 2023-07-15 MED ORDER — GLYCERIN (LAXATIVE) 2 G RE SUPP: 1.0000 | Freq: Every day | RECTAL | Status: DC | PRN

## 2023-07-15 MED ORDER — SENNOSIDES-DOCUSATE SODIUM 8.6-50 MG PO TABS
1.0000 | ORAL_TABLET | Freq: Two times a day (BID) | ORAL | Status: DC
  Administered 2023-07-18 – 2023-07-21 (×5): 1 via ORAL
  Filled 2023-07-15 (×11): qty 1

## 2023-07-15 MED ORDER — POLYETHYLENE GLYCOL 3350 17 G PO PACK
17.0000 g | PACK | Freq: Two times a day (BID) | ORAL | Status: DC
  Administered 2023-07-15 – 2023-07-21 (×6): 17 g via ORAL
  Filled 2023-07-15 (×9): qty 1

## 2023-07-15 NOTE — Progress Notes (Signed)
PT Cancellation Note  Patient Details Name: Linda [name] MRN: [id number] DOB: Mar 16, 1946   Cancelled Treatment:    Reason Eval/Treat Not Completed: Pain limiting ability to participate, Patient has requested pain medication. Will check back later as schedule allows. Blanchard Kelch PT Acute Rehabilitation Services Office [phone] Weekend pager-[phone]  Linda [name], Linda [name] 07/15/2023, 2:30 PM

## 2023-07-15 NOTE — TOC Progression Note (Addendum)
Transition of Care [facility]) - Progression Note    Patient Details  Name: Linda [name] MRN: [id number] Date of Birth: 1946-06-04  Transition of Care [facility]) CM/SW Contact  Coralyn Helling, [place] Phone Number: 07/15/2023, 10:29 AM  Clinical Narrative:   Left message for patient's son Elijah Birk, to coordinate HHPT. Awaiting call back.   12:16 PM  Spoke with patient at bedside. She reports she receives PT from facility. Attempted to call facility back to confirm. No Answer. Patient would prefer to continue with facility, but agreeable to outside agency if insurance will cover.   Patient reports issues with O2 concentrator at home and leakage. Patient reports contacting [employer], but has been unsuccessful. Patient reports someone was supposed to come out but she received a text saying it would be rescheduled. She reports she has not had any follow up since. TOC sent text to [facility] [employer] report with patient concerns. Marthann Schiller states he will put urgency on the service call.   Expected Discharge Plan: Home w Home Health Services Barriers to Discharge: Continued Medical Work up  Expected Discharge Plan and Services       Living arrangements for the past 2 months: Independent Living Facility                                       Social Determinants of Health (SDOH) Interventions SDOH Screenings   Food Insecurity: No Food Insecurity (07/07/2023)  Housing: Low Risk  (07/07/2023)  Transportation Needs: No Transportation Needs (07/07/2023)  Utilities: Not At Risk (07/07/2023)  Alcohol Screen: Low Risk  (01/01/2023)  Depression (PHQ2-9): Low Risk  (07/02/2023)  Recent Concern: Depression (PHQ2-9) - High Risk (05/01/2023)  Financial Resource Strain: Low Risk  (01/01/2023)  Physical Activity: Insufficiently Active (01/01/2023)  Social Connections: Moderately Isolated (01/01/2023)  Stress: No Stress Concern Present (01/01/2023)  Tobacco Use: Low Risk  (07/06/2023)  Recent Concern: Tobacco Use - Medium Risk  (06/03/2023)   [facility]    Readmission Risk Interventions     No data to display

## 2023-07-15 NOTE — Progress Notes (Signed)
HPI: FU CP and diastolic congestive heart failure. Cardiac catheterization February 2014 showed nonobstructive disease (most significant 50-60 RCA) and normal LV function; note of 4+ MR. Renal Dopplers May 2014 showed no stenosis. Also with history of presumed orthostatic syncope. She has had problems with diastolic CHF. Echocardiogram February 2023 showed vigorous LV function, grade 1 diastolic dysfunction.  Carotid Dopplers February 2024 showed 1 to 39% bilateral stenosis.  Chest CT April 2024 showed mediastinal and hilar adenopathy increased compared to previous and follow-up CT recommended in 3 months.  Coronary CTA April 2024 showed calcium score 1315 which was 95th percentile, moderate stenosis in the circumflex, LAD and otherwise mild disease.  FFR negative.  ABIs April 2024 normal.  Follow-up chest CT July 2024 showed asbestos related pleural disease and PET/CT recommended.  Patient recently discharged following admission for respiratory failure/dyspnea/cough.  She was treated with antibiotics for sinusitis with some improvement.  Since last seen, she continues to have some dyspnea but improved since hospitalization.  She has occasional chest heaviness which is longstanding.  There is no pedal edema.  Current Outpatient Medications  Medication Sig Dispense Refill   albuterol (VENTOLIN HFA) 108 (90 Base) MCG/ACT inhaler Inhale 1-2 puffs into the lungs every 6 (six) hours as needed for wheezing or shortness of breath. (Patient taking differently: Inhale 2 puffs into the lungs every 4 (four) hours as needed for wheezing or shortness of breath.) 18 g 5   ALPRAZolam (XANAX) 0.5 MG tablet TAKE ONE TABLET BY MOUTH THREE TIMES DAILY (Patient taking differently: Take 0.5 mg by mouth 3 (three) times daily.) 90 tablet 5   amLODipine (NORVASC) 5 MG tablet TAKE ONE TABLET BY MOUTH DAILY 90 tablet 3   aspirin 81 MG tablet Take 81 mg by mouth at bedtime.     benzonatate (TESSALON) 100 MG capsule Take 100 mg  by mouth 2 (two) times daily as needed for cough.     BREZTRI AEROSPHERE 160-9-4.8 MCG/ACT AERO Inhale 2 puffs into the lungs 2 (two) times daily. 10.7 g 0   diclofenac Sodium (VOLTAREN) 1 % GEL Apply 2 g topically 4 (four) times daily. 4 times a day to both knees (Patient taking differently: Apply 2 g topically 4 (four) times daily as needed (for pain- to affected areas).) 1000 each 4   DULoxetine (CYMBALTA) 60 MG capsule Take 1 capsule (60 mg total) by mouth daily. (Patient taking differently: Take 60 mg by mouth at bedtime.) 90 capsule 1   EPINEPHrine 0.3 mg/0.3 mL IJ SOAJ injection Inject 0.3 mg into the muscle as needed for anaphylaxis. 1 each 1   fluticasone (FLONASE) 50 MCG/ACT nasal spray Place 1 spray into both nostrils at bedtime. 16 g 0   furosemide (LASIX) 20 MG tablet Take 1 tablet (20 mg total) by mouth daily. 30 tablet 0   gabapentin (NEURONTIN) 100 MG capsule Take 1 capsule (100 mg total) by mouth 2 (two) times daily. 60 capsule 0   guaiFENesin (MUCINEX) 600 MG 12 hr tablet Take 1 tablet (600 mg total) by mouth 2 (two) times daily. 60 tablet 0   ipratropium (ATROVENT) 0.03 % nasal spray Place 2 sprays into both nostrils every 12 (twelve) hours as needed (allergies). 30 mL 5   isosorbide mononitrate (IMDUR) 30 MG 24 hr tablet Take 2 tablets (60 mg total) by mouth in the morning and at bedtime. 270 tablet 2   lamoTRIgine (LAMICTAL) 200 MG tablet Take 1 tablet (200 mg total) by mouth daily. (Patient  taking differently: Take 200 mg by mouth in the morning.) 90 tablet 1   loratadine (CLARITIN) 10 MG tablet Take 1 tablet (10 mg total) by mouth daily as needed for allergies or rhinitis. 30 tablet 0   methocarbamol (ROBAXIN) 500 MG tablet Take 1 tablet (500 mg total) by mouth every 8 (eight) hours as needed for muscle spasms. 60 tablet 2   metoprolol succinate (TOPROL-XL) 100 MG 24 hr tablet take ONE tab daily, take additional 1/2 tab as needed FOR FOR BLOOD PRESSURE > 160/90 (Patient taking  differently: Take 50-100 mg by mouth See admin instructions. Take 100 mg by mouth once a day and an additional 50 mg as needed for a B/P greater than 160/90) 135 tablet 1   montelukast (SINGULAIR) 10 MG tablet Take 1 tablet (10 mg total) by mouth at bedtime. 30 tablet 0   Multiple Vitamin (MULTIVITAMIN WITH MINERALS) TABS tablet Take 1 tablet by mouth daily with breakfast.     nitroGLYCERIN (NITROSTAT) 0.4 MG SL tablet Place 1 tablet (0.4 mg total) under the tongue every 5 (five) minutes as needed for chest pain (x 3 doses). 25 tablet 11   oxyCODONE-acetaminophen (PERCOCET) 10-325 MG tablet Take 1 tablet by mouth every 6 (six) hours as needed for pain. 120 tablet 0   OXYGEN Inhale 3 L/min into the lungs continuous.     pantoprazole (PROTONIX) 40 MG tablet Take 1 tablet (40 mg total) by mouth 2 (two) times daily. 180 tablet 1   rOPINIRole (REQUIP) 0.5 MG tablet TAKE ONE TABLET BY MOUTH AT BEDTIME 90 tablet 1   rosuvastatin (CRESTOR) 40 MG tablet Take 1 tablet (40 mg total) by mouth every evening. 90 tablet 3   senna-docusate (SENOKOT-S) 8.6-50 MG tablet Take 1 tablet by mouth 2 (two) times daily. 60 tablet 0   solifenacin (VESICARE) 10 MG tablet TAKE ONE TABLET BY MOUTH ONCE DAILY **NEED OFFICE VISIT** FOR FURTHER REFILLS (Patient taking differently: Take 10 mg by mouth daily.) 30 tablet 2   traZODone (DESYREL) 50 MG tablet take 2-4 tablets at bedtime as needed for insomnia. (Patient taking differently: Take 100-200 mg by mouth at bedtime as needed for sleep.) 120 tablet 5   No current facility-administered medications for this visit.     Past Medical History:  Diagnosis Date   Acute upper GI bleed 06/2014   while pt taking coumadin, plavix, and meloxicam---despite being told not to take coumadin.   Anginal pain (HCC)    Nonobstructive CAD 2014; however, her cardiologist put her on a statin for this and NOT for hyperlipidemia per pt report.  Atyp CP 08/2017 at card f/u, plan for myoc perf  imaging.   Anxiety    panic attacks   BPPV (benign paroxysmal positional vertigo) 12/16/2012   CAD (coronary artery disease)    Nonobstructive ([place] [facility])   Chronic diastolic CHF (congestive heart failure) (HCC)    dry wt as of 11/06/16 is 168 lbs.   Chronic lower back pain    DDD (degenerative disc disease)    lumbar and cervical.    Diverticular disease    Fibromyalgia    Patient states dx was around her late 61s but she had sx's for years prior to this.   H/O hiatal hernia    History of pneumonia    hospitalized 12/2011, 02/2013, and 07/2013 [facility]) for this   HTN (hypertension)    Renal artery dopplers 04/2013 neg for stenosis.   Hypervitaminosis D 09/27/2019   over-supplemented.  Stopped  vit D and plan recheck 2 mo.   Idiopathic angio-edema-urticaria 52841; 2021   Angioedema component was very minimal.  2021->Dr. Bobbitt (allergist) eval.   Insomnia    Iron deficiency anemia    Hematologist in [place], [place] did extensive w/u; no cause found; failed oral supplement;; gets fairly regular (q65m or so) IV iron infusions (Venofer -iron sucrose- 200mg  with procrit.  "for 14 yr I've been getting blood work q month & getting infusions prn" (07/12/2013).  Dr. Myna Hidalgo locally, iron infusions done, EPO deficiency dx'd   Migraine syndrome    "not as often anymore; used to be ~ q wk" (07/12/2013)   Mixed incontinence urge and stress    Nephrolithiasis    "passed all on my own or they are still in there" (07/12/2013)   Neuroleptic induced parkinsonism (HCC) 2018   Dr. Arbutus Leas, neuro, saw her 11/24/17 and recommended d/c of abilify as first step.  D/c'd abilify and pt got complete recovery.   Oropharyngeal dysphagia    swallowing study speech path 05/2020. Gastric bx's showed gastritis, h pylori NEG   OSA on CPAP    prior to move to [place]--had another sleep study 10/2015 w/pulm Dr. Su Monks.   Osteoarthritis    "severe; progressing fast" (07/12/2013); multiple joints-not surgical candidate for TKR (03/2015).   Triamcinolon knee injections by Dr. Hermelinda Medicus 12/2017.   Pernicious anemia 08/24/2014   Pleural plaque with presence of asbestos 07/22/2013   Pulmonary embolism (HCC) 07/2013   Dx at [facility] with very small peripheral upper lobe pe 07/2013: pt took coumadin for about 8-9 mo   Pyelonephritis    "several times over the last 30 yr" (07/12/2013)   RBBB (right bundle branch block)    Recurrent major depression (HCC)    Recurrent UTI    hx of hospitalization for pyelonephritis; started abx prophylaxis 06/2015   Syncope    Hypotensive; ED visit--Dr. Sharyn Lull did Cath--nonobstructive CAD, EF 55-60%.  In retrospect, suspect pt rx med misuse/polypharmacy   Vertebral compression fracture (HCC)    Acute T12 on 02/27/22 (fall).  multiple old thoracic-->neurosurg to do MRI as of 05/2022    Past Surgical History:  Procedure Laterality Date   APPENDECTOMY  1960   AXILLARY SURGERY Left 1978   Multiple "lump" in armpit per pt   BIOPSY  06/17/2020   Gastric bx->gastritis, h pylori neg.  Procedure: BIOPSY;  Surgeon: Jeani Hawking, MD;  Location: [facility];  Service: Endoscopy;;   CARDIAC CATHETERIZATION  01/2013   nonobstructive CAD, EF 55-60%   CARDIOVASCULAR STRESS TEST  02/22/2015   Low risk myocard perf imaging; wall motion normal, normal EF   CARDIOVASCULAR STRESS TEST     09/2017 myo perf low risk   carotid duplex doppler  10/21/2017   01/2023 1-30% bilat-->rpt 1 yr   COCCYX REMOVAL  1972   CT CTA CORONARY W/CA SCORE W/CM &/OR WO/CM     04/02/23 96%'tile calcium score, nonobst CAD-->crestor increased to 40   DEXA  06/05/2017   T-score -3.1   DILATION AND CURETTAGE OF UTERUS  ? 1970's   ESOPHAGOGASTRODUODENOSCOPY N/A 07/19/2014   Gastritis found + in the setting of supratherapeutic INR, +plavix, + meloxicam.   ESOPHAGOGASTRODUODENOSCOPY (EGD) WITH PROPOFOL N/A 06/17/2020   NO stricture or other prob to explain pt's dysphagia, dilation was done anyway.  Gastric bx's-->gastritis, h pylori neg. Procedure:  ESOPHAGOGASTRODUODENOSCOPY (EGD) WITH PROPOFOL;  Surgeon: Jeani Hawking, MD;  Location: [facility];  Service: Endoscopy;  Laterality: N/A;   EYE SURGERY Left  2012-2013   "injections for ~ 1 yr; don't really know what for" (07/12/2013)   HEEL SPUR SURGERY Left 2008   kidney stone removal Right    KNEE SURGERY  2005   LEFT HEART CATHETERIZATION WITH CORONARY ANGIOGRAM N/A 01/30/2013   Procedure: LEFT HEART CATHETERIZATION WITH CORONARY ANGIOGRAM;  Surgeon: Robynn Pane, MD;  Location: [facility]ATH [facility];  Service: Cardiovascular;  Laterality: N/A;   MALONEY DILATION  06/17/2020   Procedure: Elease Hashimoto DILATION;  Surgeon: Jeani Hawking, MD;  Location: [facility];  Service: Endoscopy;;   PLANTAR FASCIA RELEASE Left 2008   SPIROMETRY  04/25/2014   In hosp for acute asthma/COPD flare: mixed obstructive and restrictive lung disease. The FEV1 is severely reduced at 45% predicted.  FEV1 signif decreased compared to prior spirometry 07/23/13.   TENDON RELEASE  1996   Right forearm and hand   TOTAL ABDOMINAL HYSTERECTOMY  1974   TRANSTHORACIC ECHOCARDIOGRAM  01/2013; 04/2014;08/2015; 09/2017   2014--NORMAL.  2015--focal basal septal hypertrophy, EF 55-60%, grade I diast dysfxn, mild LAE.  08/2015 EF 55-60%, nl LV syst fxn, grade I DD, valves wnl. 10/21/17: EF 65-70%, grd I DD, o/w normal. 02/17/22 EF 70-75%, hyperdynamic LV fxn, grd I DD.    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  Relocated to [place] 09/2012 to be closer to her son who has MS.   Husband d 2015--mesothelioma.   Occupation: former Runner, broadcasting/film/video.   Education: masters degree level.   No T/A/Ds.   Lives in [place], independent living.   Social Determinants of Health   Financial Resource Strain: Low Risk  (01/01/2023)   Overall Financial Resource Strain (CARDIA)    Difficulty of Paying Living Expenses: Not hard at all  Food Insecurity: No Food Insecurity (07/07/2023)   Hunger Vital Sign    Worried About Running Out of Food in the Last Year: Never true    Ran Out of Food in the Last Year: Never true  Transportation Needs: No Transportation Needs (07/07/2023)   PRAPARE - Administrator, Civil Service (Medical): No    Lack of Transportation (Non-Medical): No  Physical Activity: Insufficiently Active (01/01/2023)   Exercise Vital Sign    Days of Exercise per Week: 2 days    Minutes of Exercise per Session: 20 min  Stress: No Stress Concern Present (01/01/2023)   [employer] of Occupational Health - Occupational Stress Questionnaire    Feeling of Stress : Not at all  Social Connections: Moderately Isolated (01/01/2023)   Social Connection and Isolation Panel [NHANES]    Frequency of Communication with Friends and Family: Three times a week    Frequency of Social Gatherings with Friends and Family: More than three times a week  Attends Religious Services: More than 4 times per year    Active Member of Clubs or Organizations: No    Attends Banker Meetings: Never    Marital Status: Widowed  Intimate Partner Violence: Not At Risk (07/07/2023)   Humiliation, Afraid, Rape, and Kick questionnaire    Fear of Current or Ex-Partner: No    Emotionally Abused: No    Physically Abused: No    Sexually Abused: No    Family History  Problem Relation Age of Onset   Arthritis Mother    Kidney disease Mother     Heart disease Father    Stroke Father    Hypertension Father    Diabetes Father    Heart attack Father    Heart attack Paternal Grandmother    Diabetes Sister        one sister   Hypertension Sister    Asthma Sister    Hypertension Brother    Asthma Brother    Asthma Daughter    Multiple sclerosis Son     ROS: no fevers or chills, productive cough, hemoptysis, dysphasia, odynophagia, melena, hematochezia, dysuria, hematuria, rash, seizure activity, orthopnea, PND, pedal edema, claudication. Remaining systems are negative.  Physical Exam: Well-developed well-nourished in no acute distress.  Skin is warm and dry.  HEENT is normal.  Neck is supple.  Chest with diffuse rhonchi. Cardiovascular exam is regular rate and rhythm.  Abdominal exam nontender or distended. No masses palpated. Extremities show no edema. neuro grossly intact  ECG-July 06, 2023-normal sinus rhythm, right bundle branch block.  Personally reviewed  A/P  1 coronary artery disease-noted on recent CTA.  Moderate but FFR negative.  Plan medical therapy with aspirin and statin.  2 carotid artery disease-plan follow-up carotid Dopplers February 2025.  3 hypertension-blood pressure controlled.  Continue present medications.  4 hyperlipidemia-continue statin.  5 chronic diastolic congestive heart failure-patient remains euvolemic.  Continue diuretic as needed.  6 chronic chest pain-patient continues to have occasional chest pain.  Previous cardiac CTA showed moderate disease with negative FFR.  Plan to continue medical therapy.  Olga Millers, MD

## 2023-07-15 NOTE — Progress Notes (Signed)
PROGRESS NOTE  Linda [name] UEA:[id number] DOB: 1946/11/10 DOA: 07/06/2023 PCP: Jeoffrey Massed, MD   LOS: 9 days   Brief Narrative / Interim history: 77 y.o. female with medical history significant of pulmonary asbestosis, chronic hypoxemic respiratory failure on 3 L home oxygen, vocal cord dysfunction, OSA, chronic HFpEF, history of PE, chronic pain, anxiety/depression, nonobstructive CAD, hypertension, idiopathic angioedema-urticaria, neuroleptic induced parkinsonism presented with worsening shortness of breath.  On presentation, she was in severe respiratory distress requiring nonrebreather.  CT chest with contrast showed findings consistent with asbestos related pleural disease or posttraumatic changes.  She was started on IV steroids.  Pulmonary was consulted.   Subjective / 24h Interval events: Voice seems better today, clinically she feels better.  Anxious about transferring out of the stepdown unit  Assesement and Plan: Principal Problem:   Acute on chronic respiratory failure (HCC) Active Problems:   Chronic pain   OSA (obstructive sleep apnea)   Chronic heart failure with preserved ejection fraction (HFpEF) (HCC)   Anxiety   Hypokalemia   CAD-minor 2014   Pulmonary asbestosis (HCC)   Vocal cord dysfunction   Subacute cough   Acute respiratory failure with hypoxia (HCC)   Acute bacterial sinusitis   Moderate persistent asthma with acute exacerbation   Acute non-recurrent sphenoidal sinusitis   Principal problem Acute on chronic hypoxic respiratory failure, pleural plaques consistent with asbestos lung disease -Normally wears 3 L oxygen via nasal cannula.  Presented in severe respiratory distress requiring nonrebreather, respiratory status overall appears to be improving -Pulmonary consulted, appreciate input -Continue steroids, nebs, -Continue anxiolytics and pain meds -Esophagogram 7/18 without strictures but could not evaluate for reflux. Continue to encourage  sittin in the chair throughout the day and ambulating -ENT evaluated on 7/19, no vocal cord issues -?  Postnasal drip related to sinusitis based on CT scan, started on antibiotics on 7/20 and finally there is some improvement, ready to come out of stepdown today. -Patient had episode of choking on 7/21, speech evaluated yesterday and she is at mild risk aspiration, recommending dysphagia 3 with thin liquids  Active problems OSA -Not on CPAP at home.  Will need outpatient sleep study   Chronic diastolic heart failure Hypertension Hyperlipidemia History of nonobstructive CAD -Compensated.  Echo in February 2023 showed EF of 70 to 75% with grade 1 diastolic dysfunction.  Strict input and output.  Daily weights.  Outpatient follow-up with cardiology.  Continue Imdur, metoprolol succinate -Continue statin -no chest pain   Chronic pain with possible opiate dependence Anxiety/depression -Continue current regimen.  Outpatient follow-up with PCP/pain management   Hypokalemia -replete and continue to monitor, potassium now normalized.  Magnesium normal/high   Physical deconditioning -Consult PT  Scheduled Meds:  ALPRAZolam  0.5 mg Oral TID   amLODipine  5 mg Oral Daily   arformoterol  15 mcg Nebulization BID   aspirin EC  81 mg Oral QHS   azelastine  1 spray Each Nare q morning   budesonide (PULMICORT) nebulizer solution  0.25 mg Nebulization BID   Chlorhexidine Gluconate Cloth  6 each Topical Daily   DULoxetine  30 mg Oral Daily   enoxaparin (LOVENOX) injection  40 mg Subcutaneous Q24H   famotidine  20 mg Oral QHS   fluticasone  1 spray Each Nare QHS   gabapentin  100 mg Oral BID   guaiFENesin  1,200 mg Oral BID   isosorbide mononitrate  60 mg Oral BID   lamoTRIgine  200 mg Oral Daily   loratadine  10 mg Oral Daily   methylPREDNISolone (SOLU-MEDROL) injection  40 mg Intravenous Daily   metoprolol succinate  100 mg Oral Daily   montelukast  10 mg Oral QHS   multivitamin with minerals   1 tablet Oral Daily   pantoprazole  40 mg Oral BID AC   revefenacin  175 mcg Nebulization Daily   rosuvastatin  40 mg Oral QPM   sodium chloride  2 spray Each Nare BID   traZODone  100-200 mg Oral QHS   Continuous Infusions:  sodium chloride     cefTRIAXone (ROCEPHIN)  IV Stopped (07/14/23 1112)   PRN Meds:.sodium chloride, benzonatate, haloperidol lactate, hydrALAZINE, ipratropium-albuterol, methocarbamol, ondansetron (ZOFRAN) IV, mouth rinse, oxyCODONE-acetaminophen **AND** oxyCODONE  Current Outpatient Medications  Medication Instructions   albuterol (VENTOLIN HFA) 108 (90 Base) MCG/ACT inhaler 1-2 puffs, Inhalation, Every 6 hours PRN   ALPRAZolam (XANAX) 0.5 MG tablet TAKE ONE TABLET BY MOUTH THREE TIMES DAILY   amLODipine (NORVASC) 5 mg, Oral, Daily   aspirin 81 mg, Oral, Daily at bedtime   benzonatate (TESSALON) 100 mg, Oral, 2 times daily PRN   BREZTRI AEROSPHERE 160-9-4.8 MCG/ACT AERO 2 puffs, Inhalation, 2 times daily   buprenorphine (BUTRANS) 5 MCG/HR PTWK 1 patch, Transdermal, Weekly   cyclobenzaprine (FLEXERIL) 5 mg, Oral, 2 times daily PRN   diclofenac Sodium (VOLTAREN) 2 g, Topical, 4 times daily, 4 times a day to both knees   DULoxetine (CYMBALTA) 60 mg, Oral, Daily   EPINEPHrine (EPI-PEN) 0.3 mg, Intramuscular, As needed   ipratropium (ATROVENT) 0.03 % nasal spray 2 sprays, Each Nare, Every 12 hours PRN   ipratropium-albuterol (DUONEB) 0.5-2.5 (3) MG/3ML SOLN 3 mLs, Nebulization, Every 4 hours PRN   isosorbide mononitrate (IMDUR) 60 mg, Oral, 2 times daily   lamoTRIgine (LAMICTAL) 200 mg, Oral, Daily   methocarbamol (ROBAXIN) 500 mg, Oral, Every 8 hours PRN   metoprolol succinate (TOPROL-XL) 100 MG 24 hr tablet take ONE tab daily, take additional 1/2 tab as needed FOR FOR BLOOD PRESSURE > 160/90   metoprolol tartrate (LOPRESSOR) 50 MG tablet Take 2 hours prior to CT scan   Multiple Vitamin (MULTIVITAMIN WITH MINERALS) TABS tablet 1 tablet, Oral, Daily with breakfast    nitroGLYCERIN (NITROSTAT) 0.4 mg, Sublingual, Every 5 min PRN   oxyCODONE-acetaminophen (PERCOCET) 10-325 MG tablet 1 tablet, Oral, Every 6 hours PRN   OXYGEN 3 L/min, Inhalation, Continuous   pantoprazole (PROTONIX) 40 mg, Oral, 2 times daily   rOPINIRole (REQUIP) 0.5 MG tablet TAKE ONE TABLET BY MOUTH AT BEDTIME   rosuvastatin (CRESTOR) 40 mg, Oral, Every evening   solifenacin (VESICARE) 10 MG tablet TAKE ONE TABLET BY MOUTH ONCE DAILY **NEED OFFICE VISIT** FOR FURTHER REFILLS   traZODone (DESYREL) 50 MG tablet take 2-4 tablets at bedtime as needed for insomnia.    Diet Orders (From admission, onward)     Start     Ordered   07/06/23 2036  Diet Heart Room service appropriate? Yes; Fluid consistency: Thin  Diet effective now       Question Answer Comment  Room service appropriate? Yes   Fluid consistency: Thin      07/06/23 2036            DVT prophylaxis: enoxaparin (LOVENOX) injection 40 mg Start: 07/06/23 2200   Lab Results  Component Value Date   PLT 236 07/15/2023      Code Status: DNR  Family Communication: no family at bedside   Status is: Inpatient Remains inpatient appropriate because: severity of  illness  Level of care: Progressive  Consultants:  PCCM  Objective: Vitals:   07/15/23 0800 07/15/23 0801 07/15/23 0807 07/15/23 1041  BP: (!) 151/73 (!) 151/73  (!) 157/70  Pulse: 70 66  98  Resp: 17 15    Temp:  97.7 F (36.5 C)    TempSrc:  Oral    SpO2: (!) 86% 97% 99%   Weight:      Height:        Intake/Output Summary (Last 24 hours) at 07/15/2023 1048 Last data filed at 07/15/2023 0801 Gross per 24 hour  Intake 100 ml  Output 1400 ml  Net -1300 ml   Wt Readings from Last 3 Encounters:  07/06/23 67.5 kg  07/02/23 67.6 kg  06/11/23 67.1 kg    Examination:  Constitutional: NAD Eyes: lids and conjunctivae normal, no scleral icterus ENMT: mmm Neck: normal, supple Respiratory: Upper airway wheezing mainly on the expiratory phase,  occasional gurgling sounds when she speaks Cardiovascular: Regular rate and rhythm, no murmurs / rubs / gallops. No LE edema. Abdomen: soft, no distention, no tenderness. Bowel sounds positive.  Skin: no rashes  Data Reviewed: I have independently reviewed following labs and imaging studies   CBC Recent Labs  Lab 07/10/23 0326 07/11/23 0335 07/12/23 1037 07/13/23 0306 07/15/23 0304  WBC 13.9* 11.3* 10.0 13.1* 13.4*  HGB 12.1 12.8 12.5 12.8 12.8  HCT 38.0 40.1 39.9 40.8 40.7  PLT 226 243 228 259 236  MCV 90.3 89.3 90.5 90.1 89.8  MCH 28.7 28.5 28.3 28.3 28.3  MCHC 31.8 31.9 31.3 31.4 31.4  RDW 13.1 13.2 13.2 13.1 13.5    Recent Labs  Lab 07/10/23 0326 07/11/23 0335 07/12/23 1037 07/13/23 0306  NA 137 136  --  137  K 3.4* 3.8  --  4.4  CL 102 99  --  101  CO2 26 30  --  26  GLUCOSE 118* 114*  --  181*  BUN 20 22  --  25*  CREATININE 0.81 1.01*  --  0.81  CALCIUM 8.2* 8.5*  --  8.6*  AST 17 19  --  18  ALT 16 17  --  21  ALKPHOS 59 60  --  54  BILITOT 0.7 0.5  --  0.7  ALBUMIN 3.2* 3.2*  --  3.4*  MG 2.3 2.6*  --  2.5*  PROCALCITON  --   --  <0.10  --     ------------------------------------------------------------------------------------------------------------------ No results for input(s): "CHOL", "HDL", "LDLCALC", "TRIG", "CHOLHDL", "LDLDIRECT" in the last 72 hours.  Lab Results  Component Value Date   HGBA1C 5.3 02/27/2022   ------------------------------------------------------------------------------------------------------------------ No results for input(s): "TSH", "T4TOTAL", "T3FREE", "THYROIDAB" in the last 72 hours.  Invalid input(s): "FREET3"  Cardiac Enzymes No results for input(s): "CKMB", "TROPONINI", "MYOGLOBIN" in the last 168 hours.  Invalid input(s): "CK" ------------------------------------------------------------------------------------------------------------------    Component Value Date/Time   BNP 83.4 07/06/2023 1324   BNP  19.9 10/27/2015 1602    CBG: Recent Labs  Lab 07/15/23 0742  GLUCAP 97    Recent Results (from the past 240 hour(s))  MRSA Next Gen by PCR, Nasal     Status: None   Collection Time: 07/06/23  9:48 PM   Specimen: Nasal Mucosa; Nasal Swab  Result Value Ref Range Status   MRSA by PCR Next Gen NOT DETECTED NOT DETECTED Final    Comment: (NOTE) The GeneXpert MRSA Assay (FDA approved for NASAL specimens only), is one component of a comprehensive MRSA colonization  surveillance program. It is not intended to diagnose MRSA infection nor to guide or monitor treatment for MRSA infections. Test performance is not FDA approved in patients less than 63 years old. Performed at [facility], [address], [place], [place] [address]   Respiratory (~20 pathogens) panel by PCR     Status: None   Collection Time: 07/10/23  8:03 AM   Specimen: Nasopharyngeal Swab; Respiratory  Result Value Ref Range Status   Adenovirus NOT DETECTED NOT DETECTED Final   Coronavirus 229E NOT DETECTED NOT DETECTED Final    Comment: (NOTE) The Coronavirus on the Respiratory Panel, DOES NOT test for the novel  Coronavirus (2019 nCoV)    Coronavirus HKU1 NOT DETECTED NOT DETECTED Final   Coronavirus NL63 NOT DETECTED NOT DETECTED Final   Coronavirus OC43 NOT DETECTED NOT DETECTED Final   Metapneumovirus NOT DETECTED NOT DETECTED Final   Rhinovirus / Enterovirus NOT DETECTED NOT DETECTED Final   Influenza A NOT DETECTED NOT DETECTED Final   Influenza B NOT DETECTED NOT DETECTED Final   Parainfluenza Virus 1 NOT DETECTED NOT DETECTED Final   Parainfluenza Virus 2 NOT DETECTED NOT DETECTED Final   Parainfluenza Virus 3 NOT DETECTED NOT DETECTED Final   Parainfluenza Virus 4 NOT DETECTED NOT DETECTED Final   Respiratory Syncytial Virus NOT DETECTED NOT DETECTED Final   Bordetella pertussis NOT DETECTED NOT DETECTED Final   Bordetella Parapertussis NOT DETECTED NOT DETECTED Final   Chlamydophila  pneumoniae NOT DETECTED NOT DETECTED Final   Mycoplasma pneumoniae NOT DETECTED NOT DETECTED Final    Comment: Performed at [facility], [address], [place], [place] [address]  SARS Coronavirus 2 by RT PCR (hospital order, performed in [facility] hospital lab) *cepheid single result test* Anterior Nasal Swab     Status: None   Collection Time: 07/10/23  8:03 AM   Specimen: Anterior Nasal Swab  Result Value Ref Range Status   SARS Coronavirus 2 by RT PCR NEGATIVE NEGATIVE Final    Comment: (NOTE) SARS-CoV-2 target nucleic acids are NOT DETECTED.  The SARS-CoV-2 RNA is generally detectable in upper and lower respiratory specimens during the acute phase of infection. The lowest concentration of SARS-CoV-2 viral copies this assay can detect is 250 copies / mL. A negative result does not preclude SARS-CoV-2 infection and should not be used as the sole basis for treatment or other patient management decisions.  A negative result may occur with improper specimen collection / handling, submission of specimen other than nasopharyngeal swab, presence of viral mutation(s) within the areas targeted by this assay, and inadequate number of viral copies (<250 copies / mL). A negative result must be combined with clinical observations, patient history, and epidemiological information.  Fact Sheet for Patients:   RoadLapTop.co.za  Fact Sheet for Healthcare Providers: http://kim-miller.com/  This test is not yet approved or  cleared by the [place] FDA and has been authorized for detection and/or diagnosis of SARS-CoV-2 by FDA under an Emergency Use Authorization (EUA).  This EUA will remain in effect (meaning this test can be used) for the duration of the COVID-19 declaration under Section 564(b)(1) of the Act, 21 U.S.C. section 360bbb-3(b)(1), unless the authorization is terminated or revoked sooner.  Performed at [facility], [address] [place], [place] [address]      Radiology Studies: No results found.   Pamella Pert, MD, PhD [facility]  Between 7 am - 7 pm I am available, please contact  me via Amion (for emergencies) or Securechat (non urgent messages)  Between 7 pm - 7 am I am not available, please contact night coverage MD/APP via Amion

## 2023-07-15 NOTE — Progress Notes (Signed)
Physical Therapy Treatment Patient Details Name: Linda [name] MRN: [id number] DOB: Jul 01, 1946 Today's Date: 07/15/2023   History of Present Illness Pt admitted from [name] on 07/06/23 secondary to SOB and dx with acute on chronic respiratory failure and vocal cord dysfunction.  Pt with hx of BPPV, CHF, DDD, Fibromyalgia, RBBB, syncope, pulmonary asbestosis, and T12 compression fx    PT Comments  Excellent progress this  session. Incr gait distance/tolerance with SpO2=98%, HR 70s-80s, on 4L [place] O2 throughout. Pt demonstrates improved stride length and incr gait velocity with good stability using rollator. Continue PT on acute setting. D/c plan remains appropriate.     Assistance Recommended at Discharge Intermittent Supervision/Assistance  If plan is discharge home, recommend the following:  Can travel by private vehicle    A little help with bathing/dressing/bathroom;Assistance with cooking/housework;Help with stairs or ramp for entrance;Assist for transportation;A little help with walking and/or transfers      Equipment Recommendations  None recommended by PT    Recommendations for Other Services       Precautions / Restrictions Precautions Precautions: Fall Restrictions Weight Bearing Restrictions: No     Mobility  Bed Mobility Overal bed mobility: Modified Independent Bed Mobility: Supine to Sit     Supine to sit: Modified independent (Device/Increase time)          Transfers Overall transfer level: Needs assistance Equipment used: Rollator (4 wheels) Transfers: Sit to/from Stand Sit to Stand: Supervision           General transfer comment: for safety    Ambulation/Gait Ambulation/Gait assistance: Supervision, Min guard Gait Distance (Feet): 360 Feet Assistive device: Rollator (4 wheels) Gait Pattern/deviations: Step-through pattern, Decreased stride length       General Gait Details: overall steady gait, incr stride length with no  breaks needed; SpO2=98% on 4L throughout   Stairs             Wheelchair Mobility     Tilt Bed    Modified Rankin (Stroke Patients Only)       Balance   Sitting-balance support: No upper extremity supported Sitting balance-Leahy Scale: Good     Standing balance support: No upper extremity supported Standing balance-Leahy Scale: Fair Standing balance comment: fair to good, pt able to reach outside BOS with unilateral UE support, adjust bedding and complete 360 turn without LOB                            Cognition Arousal/Alertness: Awake/alert Behavior During Therapy: WFL for tasks assessed/performed Overall Cognitive Status: Within Functional Limits for tasks assessed                                          Exercises      General Comments        Pertinent Vitals/Pain Pain Assessment Pain Assessment: No/denies pain    Home Living                          Prior Function            PT Goals (current goals can now be found in the care plan section) Acute Rehab PT Goals Patient Stated Goal: Regain IND and return to IND living facility PT Goal Formulation: With patient Time For Goal Achievement: 07/24/23 Potential to Achieve Goals: Good  Progress towards PT goals: Progressing toward goals    Frequency    Min 1X/week      PT Plan Current plan remains appropriate    Co-evaluation              AM-PAC PT "6 Clicks" Mobility   Outcome Measure  Help needed turning from your back to your side while in a flat bed without using bedrails?: None Help needed moving from lying on your back to sitting on the side of a flat bed without using bedrails?: None Help needed moving to and from a bed to a chair (including a wheelchair)?: A Little Help needed standing up from a chair using your arms (e.g., wheelchair or bedside chair)?: A Little Help needed to walk in hospital room?: A Little Help needed climbing 3-5  steps with a railing? : A Little 6 Click Score: 20    End of Session Equipment Utilized During Treatment: Gait belt Activity Tolerance: Patient tolerated treatment well Patient left: in bed;with call bell/phone within reach;with bed alarm set;with family/visitor present Nurse Communication: Mobility status PT Visit Diagnosis: Difficulty in walking, not elsewhere classified (R26.2)     Time: 4782-9562 PT Time Calculation (min) (ACUTE ONLY): 24 min  Charges:    $Gait Training: 23-37 mins PT General Charges $$ ACUTE PT VISIT: 1 Visit                     Abdoulie Tierce, PT  Acute Rehab Dept [facility]) [phone]  07/15/2023    [facility] 07/15/2023, 4:38 PM

## 2023-07-16 ENCOUNTER — Telehealth: Payer: Self-pay | Admitting: Family Medicine

## 2023-07-16 DIAGNOSIS — J9621 Acute and chronic respiratory failure with hypoxia: Secondary | ICD-10-CM | POA: Diagnosis not present

## 2023-07-16 LAB — COMPREHENSIVE METABOLIC PANEL
ALT: 29 U/L (ref 0–44)
AST: 22 U/L (ref 15–41)
Albumin: 3.5 g/dL (ref 3.5–5.0)
Alkaline Phosphatase: 58 U/L (ref 38–126)
Anion gap: 9 (ref 5–15)
BUN: 28 mg/dL — ABNORMAL HIGH (ref 8–23)
CO2: 28 mmol/L (ref 22–32)
Calcium: 8.8 mg/dL — ABNORMAL LOW (ref 8.9–10.3)
Chloride: 98 mmol/L (ref 98–111)
Creatinine, Ser: 0.86 mg/dL (ref 0.44–1.00)
GFR, Estimated: >60 mL/min (ref >60)
Glucose, Bld: 172 mg/dL — ABNORMAL HIGH (ref 70–99)
Potassium: 3.6 mmol/L (ref 3.5–5.1)
Sodium: 135 mmol/L (ref 135–145)
Total Bilirubin: 0.5 mg/dL (ref 0.3–1.2)
Total Protein: 7 g/dL (ref 6.5–8.1)

## 2023-07-16 LAB — CBC
HCT: 41.4 % (ref 36.0–46.0)
Hemoglobin: 13.1 g/dL (ref 12.0–15.0)
MCH: 28.3 pg (ref 26.0–34.0)
MCHC: 31.6 g/dL (ref 30.0–36.0)
MCV: 89.4 fL (ref 80.0–100.0)
Platelets: 236 10*3/uL (ref 150–400)
RBC: 4.63 MIL/uL (ref 3.87–5.11)
RDW: 13.5 % (ref 11.5–15.5)
WBC: 13.4 10*3/uL — ABNORMAL HIGH (ref 4.0–10.5)
nRBC: 0 % (ref 0.0–0.2)

## 2023-07-16 LAB — MAGNESIUM: Magnesium: 2.5 mg/dL — ABNORMAL HIGH (ref 1.7–2.4)

## 2023-07-16 MED ORDER — BENZONATATE 100 MG PO CAPS
200.0000 mg | ORAL_CAPSULE | Freq: Three times a day (TID) | ORAL | Status: DC
  Administered 2023-07-16 – 2023-07-21 (×14): 200 mg via ORAL
  Filled 2023-07-16 (×14): qty 2

## 2023-07-16 NOTE — Progress Notes (Signed)
PROGRESS NOTE  [name]  DOB: 1946-07-30  PCP: Jeoffrey Massed, MD NWG:[id number]  DOA: 07/06/2023  LOS: 10 days  Hospital Day: 11  Brief narrative: Linda [name] is a 77 y.o. female with PMH significant for pulmonary asbestosis, pleural plaques moderate persistent asthma, chronic hypoxemic respiratory failure on 3 L home oxygen, vocal cord dysfunction, OSA, chronic HFpEF, history of PE, chronic pain, anxiety/depression, nonobstructive CAD, hypertension, idiopathic angioedema-urticaria, neuroleptic induced parkinsonism who lives in an independent living facility. 7/14, patient presented with complaint of several weeks of cough, shortness of breath treated with antibiotics, steroids and inhalers as an outpatient but continued to have symptoms. She was noted to be in severe respiratory distress requiring nonrebreather mask CT chest showed known heavy bilateral pleural calcifications due to asbestosis.  Admitted to [facility]  Pulmonary consulted. Patient was started on optimal management with IV steroids, IV antibiotics bronchodilators, antitussives. Her hospital course got prolonged due to persistent symptoms. 7/18, esophagram without strictures, could not evaluate for reflux. ENT was consulted as well. 7/19, ENT did fiberoptic exam of the pharynx and larynx which were unremarkable for any abnormal findings or mass.  Vocal cords were normally mobile. 7/20, CT maxillofacial was obtained which showed paranasal sinus disease with a small air-fluid level in the right sphenoid sinus consistent with acute sinusitis  Subjective: Patient was seen and examined this morning.  Cough improving but is still feels poor on taking deep breaths or talking more than few words. Chart reviewed In the last 24 hours, afebrile, hemodynamically stable, on 4 L oxygen by nasal cannula  Assessment and plan: Acute on chronic hypoxic respiratory failure Acute exacerbation of moderate persistent  asthma Acute sinusitis Based on patient's progression of symptoms, imagings normal direct laryngoscopy findings and response to treatment, the hypothesis at this time is that patient probably had asthma exacerbation triggered by sinusitis  Currently on IV Rocephin Currently on bronchodilators, antihistamine, Singulair, Flonase, azelastine, sinus saline rinses Chronically on 3 L oxygen by nasal cannula.  Check ambulatory oxygen requirement today. I switched her from as needed to scheduled Tessalon pearls today.  Chronic pulmonary asbestosis Chronic pleural plaques Follow-up with pulmonary as an outpatient  Agitation Chronic pain with possible opiate dependence Anxiety/depression  Continue current regimen with Cymbalta, as needed Xanax, as needed IV Haldol Outpatient follow-up with PCP/pain management   Chronic diastolic CHF Hypertension CAD/HLD Volume status compensated. Most recent echo from February 2023 with EF 70 to 75%, grade 1 diastolic dysfunction No anginal symptoms currently.   Continue Toprol, Imdur, amlodipine, aspirin and statin  OSA  Not on CPAP at home.  Will need outpatient sleep study   Hypokalemia Improved with replacement. Recent Labs  Lab 07/10/23 0326 07/11/23 0335 07/13/23 0306 07/16/23 0411  K 3.4* 3.8 4.4 3.6  MG 2.3 2.6* 2.5* 2.5*   Physical deconditioning PT eval obtained.   HHPT recommended.  Goals of care   Code Status: DNR     DVT prophylaxis:  enoxaparin (LOVENOX) injection 40 mg Start: 07/06/23 2200   Antimicrobials: IV Rocephin Fluid: None Consultants: PCCM Family Communication:   Status: Inpatient Level of care:  Progressive   Patient from: Home Anticipated d/c to: Hopefully home in 1 to 2 days Needs to continue in-hospital care:  Improving respiratory status, continues to have significant cough.   Diet:  Diet Order             Diet regular Room service appropriate? Yes; Fluid consistency: Thin  Diet effective now  Scheduled Meds:  ALPRAZolam  0.5 mg Oral TID   amLODipine  5 mg Oral Daily   arformoterol  15 mcg Nebulization BID   aspirin EC  81 mg Oral QHS   azelastine  1 spray Each Nare q morning   budesonide (PULMICORT) nebulizer solution  0.25 mg Nebulization BID   Chlorhexidine Gluconate Cloth  6 each Topical Daily   DULoxetine  30 mg Oral Daily   enoxaparin (LOVENOX) injection  40 mg Subcutaneous Q24H   famotidine  20 mg Oral QHS   fluticasone  1 spray Each Nare QHS   gabapentin  100 mg Oral BID   guaiFENesin  1,200 mg Oral BID   isosorbide mononitrate  60 mg Oral BID   lamoTRIgine  200 mg Oral Daily   loratadine  10 mg Oral Daily   methylPREDNISolone (SOLU-MEDROL) injection  40 mg Intravenous Daily   metoprolol succinate  100 mg Oral Daily   montelukast  10 mg Oral QHS   multivitamin with minerals  1 tablet Oral Daily   pantoprazole  40 mg Oral BID AC   polyethylene glycol  17 g Oral BID   revefenacin  175 mcg Nebulization Daily   rosuvastatin  40 mg Oral QPM   senna-docusate  1 tablet Oral BID   sodium chloride  2 spray Each Nare BID   traZODone  100-200 mg Oral QHS    PRN meds: sodium chloride, benzonatate, Glycerin (Adult), haloperidol lactate, hydrALAZINE, ipratropium-albuterol, methocarbamol, ondansetron (ZOFRAN) IV, mouth rinse, oxyCODONE-acetaminophen **AND** oxyCODONE   Infusions:   sodium chloride     cefTRIAXone (ROCEPHIN)  IV Stopped (07/15/23 1134)    Antimicrobials: Anti-infectives (From admission, onward)    Start     Dose/Rate Route Frequency Ordered Stop   07/13/23 0745  cefTRIAXone (ROCEPHIN) 2 g in sodium chloride 0.9 % 100 mL IVPB        2 g 200 mL/hr over 30 Minutes Intravenous Daily 07/13/23 0733     07/07/23 1030  azithromycin (ZITHROMAX) tablet 500 mg        500 mg Oral Daily 07/07/23 0930 07/11/23 0905       Nutritional status:  Body mass index is 25.54 kg/m.          Objective: Vitals:   07/16/23 0343 07/16/23  0733  BP: 127/71   Pulse: 69   Resp: 18   Temp: 98 F (36.7 C)   SpO2: 98% 94%    Intake/Output Summary (Last 24 hours) at 07/16/2023 0935 Last data filed at 07/15/2023 1700 Gross per 24 hour  Intake 120 ml  Output --  Net 120 ml   Filed Weights   07/06/23 1500 07/06/23 2145  Weight: 67.6 kg 67.5 kg   Weight change:  Body mass index is 25.54 kg/m.   Physical Exam: General exam: Pleasant elderly Caucasian female. Skin: No rashes, lesions or ulcers. HEENT: Atraumatic, normocephalic, no obvious bleeding Lungs: No wheezing but coughs on deep breathing CVS: Mild tachycardia, no murmur GI/Abd soft, nontender, nondistended, bowel sound present CNS: Alert, awake, oriented x 3 Psychiatry: Mood appropriate Extremities: No pedal edema, no calf tenderness  Data Review: I have personally reviewed the laboratory data and studies available.  F/u labs ordered Unresulted Labs (From admission, onward)     Start     Ordered   07/12/23 1033  Expectorated Sputum Assessment w Gram Stain, Rflx to Resp Cult  Once,   R        07/12/23 1032  Total time spent in review of labs and imaging, patient evaluation, formulation of plan, documentation and communication with family: 55 minutes  Signed, Lorin Glass, MD [facility] 07/16/2023

## 2023-07-16 NOTE — Plan of Care (Signed)

## 2023-07-16 NOTE — Telephone Encounter (Signed)
Linda [name] called to inform us that she is currently in the ICU and she has been for 2 weeks now. She did not have an expected discharge date as of now.

## 2023-07-16 NOTE — Telephone Encounter (Signed)
Noted  

## 2023-07-16 NOTE — Progress Notes (Signed)
Mobility Specialist - Progress Note   07/16/23 1431  Mobility  Activity Ambulated with assistance in hallway;Ambulated with assistance to bathroom  Level of Assistance Standby assist, set-up cues, supervision of patient - no hands on  Assistive Device Other (Comment) (rollator)  Distance Ambulated (ft) 350 ft  Range of Motion/Exercises Active  Activity Response Tolerated well  Mobility Referral Yes  $Mobility charge 1 Mobility  Mobility Specialist Start Time (ACUTE ONLY) 1400  Mobility Specialist Stop Time (ACUTE ONLY) 1430  Mobility Specialist Time Calculation (min) (ACUTE ONLY) 30 min   Pt was found in bed and agreeable to ambulate. Had some SOB and coughing with ambulation. At EOS returned to use bathroom. At EOS was left in bed with NT in room.  Billey Chang Mobility Specialist

## 2023-07-16 NOTE — TOC Progression Note (Addendum)
Transition of Care [facility]) - Progression Note    Patient Details  Name: Linda [name] MRN: [id number] Date of Birth: Jul 07, 1946  Transition of Care [facility]) CM/SW Contact  Howell Rucks, RN Phone Number: 07/16/2023, 9:37 AM  Clinical Narrative:   PT eval completed, recommendation for [facility] PT continuation at pt's ILF [facility]).  NCM outreached to [facility], spoke with Lauris Poag, confirmed pt is an ILF resident and that the patient has been receiving PT services through [facility].  NCM outreached to [facility] therapy, confirmed patient is receiving PT services, requesting a HH PT order to be faxed to [phone]. NCM sent request to attending to enter [facility] PT order with face to face.   -1:25pm HH PT order, face to face, PT eval and progress notes faxed to [facility] at [phone]    Expected Discharge Plan: Home w Home Health Services Barriers to Discharge: Continued Medical Work up  Expected Discharge Plan and Services       Living arrangements for the past 2 months: Independent Living Facility                                       Social Determinants of Health (SDOH) Interventions SDOH Screenings   Food Insecurity: No Food Insecurity (07/07/2023)  Housing: Low Risk  (07/07/2023)  Transportation Needs: No Transportation Needs (07/07/2023)  Utilities: Not At Risk (07/07/2023)  Alcohol Screen: Low Risk  (01/01/2023)  Depression (PHQ2-9): Low Risk  (07/02/2023)  Recent Concern: Depression (PHQ2-9) - High Risk (05/01/2023)  Financial Resource Strain: Low Risk  (01/01/2023)  Physical Activity: Insufficiently Active (01/01/2023)  Social Connections: Moderately Isolated (01/01/2023)  Stress: No Stress Concern Present (01/01/2023)  Tobacco Use: Low Risk  (07/06/2023)  Recent Concern: Tobacco Use - Medium Risk (06/03/2023)   [facility]    Readmission Risk Interventions    07/16/2023    9:36 AM  Readmission Risk Prevention Plan  Transportation  Screening Complete  Medication Review (RN Care Manager) Complete  PCP or Specialist appointment within 3-5 days of discharge Complete  HRI or Home Care Consult Complete  SW Recovery Care/Counseling Consult Complete  Palliative Care Screening Not Applicable  Skilled Nursing Facility Not Applicable

## 2023-07-16 NOTE — Care Management Important Message (Signed)
Important Message  Patient Details IM Letter given. Name: BAMBIE PIZZOLATO MRN: [id number] Date of Birth: 1946-07-02   Medicare Important Message Given:  Yes     [name] 07/16/2023, 1:39 PM

## 2023-07-17 ENCOUNTER — Other Ambulatory Visit: Payer: Self-pay | Admitting: Family Medicine

## 2023-07-17 DIAGNOSIS — J9621 Acute and chronic respiratory failure with hypoxia: Secondary | ICD-10-CM | POA: Diagnosis not present

## 2023-07-17 MED ORDER — PHENOL 1.4 % MT LIQD
1.0000 | OROMUCOSAL | Status: DC | PRN
  Filled 2023-07-17: qty 177

## 2023-07-17 MED ORDER — METHOCARBAMOL 500 MG PO TABS
500.0000 mg | ORAL_TABLET | Freq: Four times a day (QID) | ORAL | Status: DC | PRN
  Administered 2023-07-17 – 2023-07-21 (×12): 500 mg via ORAL
  Filled 2023-07-17 (×13): qty 1

## 2023-07-17 MED ORDER — ALBUTEROL SULFATE (2.5 MG/3ML) 0.083% IN NEBU
2.5000 mg | INHALATION_SOLUTION | RESPIRATORY_TRACT | Status: DC | PRN
  Administered 2023-07-17 – 2023-07-18 (×3): 2.5 mg via RESPIRATORY_TRACT
  Filled 2023-07-17 (×3): qty 3

## 2023-07-17 NOTE — Progress Notes (Signed)
Physical Therapy Treatment Patient Details Name: Linda [name] MRN: [id number] DOB: 09-02-1946 Today's Date: 07/17/2023   History of Present Illness Pt admitted from [name] living on 07/06/23 secondary to SOB and dx with acute on chronic respiratory failure and vocal cord dysfunction.  Pt with hx of BPPV, CHF, DDD, Fibromyalgia, RBBB, syncope, pulmonary asbestosis, and T12 compression fx    PT Comments  Pt demo good bed mobility, no physical assist or cues. Pt performs transfers with rollator, aware of lines, therapist managing them as needed. Pt ambulates 360 ft with rollator walker, able to maneuver around objects in room and hallway, completes turns, cadence WFL, audible wheezing noted, pt on 4L with SpO2 100%.     Assistance Recommended at Discharge Intermittent Supervision/Assistance  If plan is discharge home, recommend the following:  Can travel by private vehicle    A little help with bathing/dressing/bathroom;Assistance with cooking/housework;Help with stairs or ramp for entrance;Assist for transportation;A little help with walking and/or transfers      Equipment Recommendations  None recommended by PT    Recommendations for Other Services       Precautions / Restrictions Precautions Precautions: Fall Restrictions Weight Bearing Restrictions: No     Mobility  Bed Mobility Overal bed mobility: Modified Independent                  Transfers Overall transfer level: Needs assistance Equipment used: Rollator (4 wheels)   Sit to Stand: Supervision           General transfer comment: supv, therapist managing O2 and IV lines    Ambulation/Gait Ambulation/Gait assistance: Supervision Gait Distance (Feet): 360 Feet Assistive device: Rollator (4 wheels) Gait Pattern/deviations: Step-through pattern, Decreased stride length Gait velocity: WFL     General Gait Details: step through gait pattern, able to maneuver rollator around objects in hallway  and in room environment, dyspnea 3/4 on 4L O2 with SpO2 100%   Stairs             Wheelchair Mobility     Tilt Bed    Modified Rankin (Stroke Patients Only)       Balance Overall balance assessment: No apparent balance deficits (not formally assessed) Sitting-balance support: No upper extremity supported Sitting balance-Leahy Scale: Good     Standing balance support: During functional activity Standing balance-Leahy Scale: Fair Standing balance comment: static able to release BUE, dynamic using rollator                            Cognition Arousal/Alertness: Awake/alert Behavior During Therapy: WFL for tasks assessed/performed Overall Cognitive Status: Within Functional Limits for tasks assessed                                          Exercises      General Comments        Pertinent Vitals/Pain Pain Assessment Pain Assessment: No/denies pain    Home Living                          Prior Function            PT Goals (current goals can now be found in the care plan section) Acute Rehab PT Goals Patient Stated Goal: Regain IND and return to IND living facility PT Goal Formulation: With patient  Time For Goal Achievement: 07/24/23 Potential to Achieve Goals: Good Progress towards PT goals: Progressing toward goals    Frequency    Min 1X/week      PT Plan Current plan remains appropriate    Co-evaluation              AM-PAC PT "6 Clicks" Mobility   Outcome Measure  Help needed turning from your back to your side while in a flat bed without using bedrails?: None Help needed moving from lying on your back to sitting on the side of a flat bed without using bedrails?: None Help needed moving to and from a bed to a chair (including a wheelchair)?: A Little Help needed standing up from a chair using your arms (e.g., wheelchair or bedside chair)?: A Little Help needed to walk in hospital room?: A  Little Help needed climbing 3-5 steps with a railing? : A Little 6 Click Score: 20    End of Session Equipment Utilized During Treatment: Gait belt Activity Tolerance: Patient tolerated treatment well Patient left: in bed;with call bell/phone within reach;with bed alarm set Nurse Communication: Mobility status PT Visit Diagnosis: Difficulty in walking, not elsewhere classified (R26.2)     Time: 1610-9604 PT Time Calculation (min) (ACUTE ONLY): 24 min  Charges:    $Gait Training: 8-22 mins $Therapeutic Activity: 8-22 mins PT General Charges $$ ACUTE PT VISIT: 1 Visit                     Tori Apoorva Bugay PT, DPT 07/17/23, 12:34 PM

## 2023-07-17 NOTE — Progress Notes (Signed)
PROGRESS NOTE  Linda [name]  DOB: Jul 31, 1946  PCP: Jeoffrey Massed, MD ZOX:[id number]  DOA: 07/06/2023  LOS: 11 days  Hospital Day: 12  Brief narrative: Linda [name] is a 77 y.o. female with PMH significant for pulmonary asbestosis, pleural plaques moderate persistent asthma, chronic hypoxemic respiratory failure on 3 L home oxygen, vocal cord dysfunction, OSA, chronic HFpEF, history of PE, chronic pain, anxiety/depression, nonobstructive CAD, hypertension, idiopathic angioedema-urticaria, neuroleptic induced parkinsonism who lives in an independent living facility. 7/14, patient presented with complaint of several weeks of cough, shortness of breath treated with antibiotics, steroids and inhalers as an outpatient but continued to have symptoms. She was noted to be in severe respiratory distress requiring nonrebreather mask CT chest showed known heavy bilateral pleural calcifications due to asbestosis.  Admitted to [facility]  Pulmonary consulted. Patient was started on optimal management with IV steroids, IV antibiotics bronchodilators, antitussives. Her hospital course got prolonged due to persistent symptoms. 7/18, esophagram without strictures, could not evaluate for reflux. ENT was consulted as well. 7/19, ENT did fiberoptic exam of the pharynx and larynx which were unremarkable for any abnormal findings or mass.  Vocal cords were normally mobile. 7/20, CT maxillofacial was obtained which showed paranasal sinus disease with a small air-fluid level in the right sphenoid sinus consistent with acute sinusitis  Subjective: Patient was seen and examined this morning.  Continues to have cough making her short of breath.  Intermittent episodes.  No wheezing.  Assessment and plan: Acute on chronic hypoxic respiratory failure Acute exacerbation of moderate persistent asthma Acute sinusitis Based on patient's progression of symptoms, imagings normal direct laryngoscopy findings and  response to treatment, the hypothesis at this time is that patient probably had asthma exacerbation triggered by sinusitis  Currently on IV Rocephin Currently on bronchodilators, antihistamine, Singulair, Flonase, azelastine, sinus saline rinses Chronically on 3 L oxygen by nasal cannula.  Check ambulatory oxygen requirement today. For cough, I switched her from PRN to scheduled Tessalon Perles yesterday.  Continue same for today. Encourage ambulation, use of incentive spirometry, flutter valve  Chronic pulmonary asbestosis Chronic pleural plaques Follow-up with pulmonary as an outpatient  Agitation Chronic pain with possible opiate dependence Anxiety/depression  Continue current regimen with Cymbalta, as needed Xanax, as needed IV Haldol Outpatient follow-up with PCP/pain management   Chronic diastolic CHF Hypertension CAD/HLD Volume status compensated. Most recent echo from February 2023 with EF 70 to 75%, grade 1 diastolic dysfunction No anginal symptoms currently.   Continue Toprol, Imdur, amlodipine, aspirin and statin  OSA  Not on CPAP at home.  Will need outpatient sleep study   Hypokalemia Potassium level improved with replacement. Recent Labs  Lab 07/11/23 0335 07/13/23 0306 07/16/23 0411  K 3.8 4.4 3.6  MG 2.6* 2.5* 2.5*   Physical deconditioning PT eval obtained.   HHPT recommended.  Goals of care   Code Status: DNR     DVT prophylaxis:  enoxaparin (LOVENOX) injection 40 mg Start: 07/06/23 2200   Antimicrobials: IV Rocephin Fluid: None Consultants: PCCM Family Communication:   Status: Inpatient Level of care:  Progressive   Patient from: Home Anticipated d/c to: Hopefully home tomorrow if cough improves Needs to continue in-hospital care:  Improving respiratory status, continues to have significant cough.   Diet:  Diet Order             Diet regular Room service appropriate? Yes; Fluid consistency: Thin  Diet effective now  Scheduled Meds:  ALPRAZolam  0.5 mg Oral TID   amLODipine  5 mg Oral Daily   arformoterol  15 mcg Nebulization BID   aspirin EC  81 mg Oral QHS   azelastine  1 spray Each Nare q morning   benzonatate  200 mg Oral TID   budesonide (PULMICORT) nebulizer solution  0.25 mg Nebulization BID   DULoxetine  30 mg Oral Daily   enoxaparin (LOVENOX) injection  40 mg Subcutaneous Q24H   famotidine  20 mg Oral QHS   fluticasone  1 spray Each Nare QHS   gabapentin  100 mg Oral BID   guaiFENesin  1,200 mg Oral BID   isosorbide mononitrate  60 mg Oral BID   lamoTRIgine  200 mg Oral Daily   loratadine  10 mg Oral Daily   methylPREDNISolone (SOLU-MEDROL) injection  40 mg Intravenous Daily   metoprolol succinate  100 mg Oral Daily   montelukast  10 mg Oral QHS   multivitamin with minerals  1 tablet Oral Daily   pantoprazole  40 mg Oral BID AC   polyethylene glycol  17 g Oral BID   revefenacin  175 mcg Nebulization Daily   rosuvastatin  40 mg Oral QPM   senna-docusate  1 tablet Oral BID   sodium chloride  2 spray Each Nare BID   traZODone  100-200 mg Oral QHS    PRN meds: sodium chloride, albuterol, Glycerin (Adult), haloperidol lactate, hydrALAZINE, methocarbamol, ondansetron (ZOFRAN) IV, mouth rinse, oxyCODONE-acetaminophen **AND** oxyCODONE   Infusions:   sodium chloride     cefTRIAXone (ROCEPHIN)  IV 2 g (07/17/23 4098)    Antimicrobials: Anti-infectives (From admission, onward)    Start     Dose/Rate Route Frequency Ordered Stop   07/13/23 0745  cefTRIAXone (ROCEPHIN) 2 g in sodium chloride 0.9 % 100 mL IVPB        2 g 200 mL/hr over 30 Minutes Intravenous Daily 07/13/23 0733     07/07/23 1030  azithromycin (ZITHROMAX) tablet 500 mg        500 mg Oral Daily 07/07/23 0930 07/11/23 0905       Nutritional status:  Body mass index is 25.54 kg/m.          Objective: Vitals:   07/17/23 0824 07/17/23 0900  BP:  (!) 136/98  Pulse:  73  Resp:    Temp:    SpO2:  98%     Intake/Output Summary (Last 24 hours) at 07/17/2023 1057 Last data filed at 07/17/2023 0537 Gross per 24 hour  Intake 570 ml  Output 1050 ml  Net -480 ml   Filed Weights   07/06/23 1500 07/06/23 2145  Weight: 67.6 kg 67.5 kg   Weight change:  Body mass index is 25.54 kg/m.   Physical Exam: General exam: Pleasant elderly Caucasian female. Skin: No rashes, lesions or ulcers. HEENT: Atraumatic, normocephalic, no obvious bleeding Lungs: No wheezing but coughs on deep breathing CVS: Mild tachycardia, no murmur GI/Abd soft, nontender, nondistended, bowel sound present CNS: Alert, awake, oriented x 3 Psychiatry: Mood appropriate Extremities: No pedal edema, no calf tenderness  Data Review: I have personally reviewed the laboratory data and studies available.  F/u labs ordered Unresulted Labs (From admission, onward)     Start     Ordered   07/12/23 1033  Expectorated Sputum Assessment w Gram Stain, Rflx to Resp Cult  Once,   R        07/12/23 1032  Total time spent in review of labs and imaging, patient evaluation, formulation of plan, documentation and communication with family: 45 minutes  Signed, Lorin Glass, MD [facility] 07/17/2023

## 2023-07-17 NOTE — Patient Outreach (Signed)
  Care Coordination   Documentation  Visit Note   07/17/2023 Name: AYANE DELANCEY MRN: [id number] DOB: [birth year]  RACHELE LAMASTER is a 77 y.o. year old female who sees McGowen, Maryjean Morn, MD for primary care. I reviewed patient chart. Patient hospitalized since 07/06/23.  RN CM will follow up as appropriate upon hospital discharge  What matters to the patients health and wellness today?  N/A    Goals Addressed   None     SDOH assessments and interventions completed:  No     Care Coordination Interventions:  No, not indicated   Follow up plan:  As appropriate upon hospital discharge    Encounter Outcome:  Pt. Visit Completed   Bary Leriche, RN, MSN [facility] Care Management Care Management Coordinator Direct Line [phone]

## 2023-07-17 NOTE — TOC Progression Note (Addendum)
Transition of Care [facility]) - Progression Note    Patient Details  Name: Linda [name] MRN: [id number] Date of Birth: 02/03/1946  [facility] of [facility]) CM/SW Contact  Howell Rucks, RN Phone Number: 07/17/2023, 1:54 PM  Clinical Narrative:   Teams chat received from PT, pt inquiring if she can get new 02 equipment covered by her insurance. NCM  called to [employer], sw Zach, request confirmation of  Home 02 services, per Ian Malkin, pt is no longer on service with them for 02 services.   -2:40pm Text received from [facility] with [employer], reports there is a service ticket in to get to pt's home to check equipment to see if issued bu [employer].   -2:55pm NCM with pt at bedside, pt reports she has a portable 02 that she purchased herself 6 years ago, reports she would like to have a new one. Teams chat sent to attending to enter Home 02 order and order for ambulatory 02 sats. Text sent to [employer], rep-Zach for portable 02. TOC will continue to follow.       Expected Discharge Plan: Home w Home Health Services Barriers to Discharge: Continued Medical Work up  Expected Discharge Plan and Services       Living arrangements for the past 2 months: Independent Living Facility                                       Social Determinants of Health (SDOH) Interventions SDOH Screenings   Food Insecurity: No Food Insecurity (07/07/2023)  Housing: Low Risk  (07/07/2023)  Transportation Needs: No Transportation Needs (07/07/2023)  Utilities: Not At Risk (07/07/2023)  Alcohol Screen: Low Risk  (01/01/2023)  Depression (PHQ2-9): Low Risk  (07/02/2023)  Recent Concern: Depression (PHQ2-9) - High Risk (05/01/2023)  Financial Resource Strain: Low Risk  (01/01/2023)  Physical Activity: Insufficiently Active (01/01/2023)  Social Connections: Moderately Isolated (01/01/2023)  Stress: No Stress Concern Present (01/01/2023)  Tobacco Use: Low Risk  (07/06/2023)  Recent Concern: Tobacco Use - Medium  Risk (06/03/2023)   [facility]    Readmission Risk Interventions    07/16/2023    9:36 AM  Readmission Risk Prevention Plan  Transportation Screening Complete  Medication Review (RN Care Manager) Complete  PCP or Specialist appointment within 3-5 days of discharge Complete  HRI or Home Care Consult Complete  SW Recovery Care/Counseling Consult Complete  Palliative Care Screening Not Applicable  Skilled Nursing Facility Not Applicable

## 2023-07-17 NOTE — Progress Notes (Signed)
SATURATION QUALIFICATIONS: (This note is used to comply with regulatory documentation for home oxygen)  Patient Saturations on Room Air at Rest = 97%  Patient Saturations on Room Air while Ambulating = 94%  Patient Saturations on 3 Liters of oxygen while Ambulating = 100%

## 2023-07-18 ENCOUNTER — Inpatient Hospital Stay (HOSPITAL_COMMUNITY): Payer: Medicare Other

## 2023-07-18 DIAGNOSIS — J9621 Acute and chronic respiratory failure with hypoxia: Secondary | ICD-10-CM | POA: Diagnosis not present

## 2023-07-18 MED ORDER — FUROSEMIDE 20 MG PO TABS
20.0000 mg | ORAL_TABLET | Freq: Every day | ORAL | Status: DC
  Administered 2023-07-18 – 2023-07-21 (×4): 20 mg via ORAL
  Filled 2023-07-18 (×4): qty 1

## 2023-07-18 MED ORDER — NYSTATIN 100000 UNIT/ML MT SUSP
5.0000 mL | Freq: Four times a day (QID) | OROMUCOSAL | Status: DC
  Administered 2023-07-18 – 2023-07-21 (×12): 500000 [IU] via ORAL
  Filled 2023-07-18 (×12): qty 5

## 2023-07-18 MED ORDER — PREDNISONE 20 MG PO TABS
20.0000 mg | ORAL_TABLET | Freq: Every day | ORAL | Status: DC
  Administered 2023-07-19: 20 mg via ORAL
  Filled 2023-07-18: qty 1

## 2023-07-18 MED ORDER — ONDANSETRON HCL 4 MG/2ML IJ SOLN
4.0000 mg | Freq: Four times a day (QID) | INTRAMUSCULAR | Status: DC | PRN
  Administered 2023-07-18 – 2023-07-21 (×7): 4 mg via INTRAVENOUS
  Filled 2023-07-18 (×7): qty 2

## 2023-07-18 NOTE — TOC Progression Note (Addendum)
Transition of Care [facility]) - Progression Note    Patient Details  Name: Linda [name] MRN: [id number] Date of Birth: 1946/06/05  [facility]) CM/SW Contact  Howell Rucks, RN Phone Number: 07/18/2023, 2:28 PM  Clinical Narrative:  NCM outreached to Ian Malkin at [employer] to get update on status of request for portable 02 and home service call for home 02 concentrators, await call back.     -2:52pm Text received from [facility] with [employer], service ticket was created for driver to  come out to pt's home to service concentrator today. TOC will continue to follow.     Expected Discharge Plan: Home w Home Health Services Barriers to Discharge: Continued Medical Work up  Expected Discharge Plan and Services       Living arrangements for the past 2 months: Independent Living Facility                                       Social Determinants of Health (SDOH) Interventions SDOH Screenings   Food Insecurity: No Food Insecurity (07/07/2023)  Housing: Low Risk  (07/07/2023)  Transportation Needs: No Transportation Needs (07/07/2023)  Utilities: Not At Risk (07/07/2023)  Alcohol Screen: Low Risk  (01/01/2023)  Depression (PHQ2-9): Low Risk  (07/02/2023)  Recent Concern: Depression (PHQ2-9) - High Risk (05/01/2023)  Financial Resource Strain: Low Risk  (01/01/2023)  Physical Activity: Insufficiently Active (01/01/2023)  Social Connections: Moderately Isolated (01/01/2023)  Stress: No Stress Concern Present (01/01/2023)  Tobacco Use: Low Risk  (07/06/2023)  Recent Concern: Tobacco Use - Medium Risk (06/03/2023)   [facility]    Readmission Risk Interventions    07/16/2023    9:36 AM  Readmission Risk Prevention Plan  Transportation Screening Complete  Medication Review (RN Care Manager) Complete  PCP or Specialist appointment within 3-5 days of discharge Complete  HRI or Home Care Consult Complete  SW Recovery Care/Counseling Consult  Complete  Palliative Care Screening Not Applicable  Skilled Nursing Facility Not Applicable

## 2023-07-18 NOTE — Progress Notes (Signed)
Sent message to Dr Everardo All pulmonary to call pt son per patient request

## 2023-07-18 NOTE — Progress Notes (Signed)
PT Cancellation Note  Patient Details Name: Linda [name] MRN: [id number] DOB: Nov 15, 1946   Cancelled Treatment:    Reason Eval/Treat Not Completed: Other (comment). Pt completing wash up with nursing on 1st attempt then preparing to go down for xray on 2nd attempt. Will continue to follow.   Domenick Bookbinder PT, DPT 07/18/23, 12:47 PM

## 2023-07-18 NOTE — Progress Notes (Signed)
   NAME:  Linda [name], MRN:  [id number], DOB:  02/02/1946, LOS: 12 ADMISSION DATE:  07/06/2023, CONSULTATION DATE: 07/06/2023 REFERRING MD: Dr. Loney Loh, CHIEF COMPLAINT: Respiratory distress  History of Present Illness:  77 yo female with hx of asbestos exposure and pleural plaques, chronic respiratory failure with hypoxia, and vocal cord dysfunction started having trouble with her breathing about 6 weeks ago. She has cough and throat irritation. Gets a rattling in her throat. Gets anxious when she has trouble with her breathing. Nebulizer treatments helps a little.   Pertinent  Medical History  Upper GI bleeding, Anxiety, Vertigo, CAD, HFpEF, Back pain, [name], Diverticulosis, Fibromyalgia, Hiatal hernia, Pneumonia, HTN, Idiopathic urticaria, Insomnia, IDA, Migraine headache, Nephrolithiasis, OSA, Pernicious anemia, PE 2014, Pyelonephritis, RBBB, Depression  Studies:  CT chest 07/06/23 >> heavy bilateral pleural calcifications with associated nodular pleural and subpleural thickening most prominent in Lt lower lobe  Interim History / Subjective:  Coughing while talking. Tearful. Is afraid of having breathing issues at home On her home 3L O2  Objective   Blood pressure (!) 145/72, pulse 69, temperature 97.9 F (36.6 C), temperature source Oral, resp. rate 18, height 5\' 4"  (1.626 m), weight 67.5 kg, SpO2 98%.    FiO2 (%):  [36 %] 36 %   Intake/Output Summary (Last 24 hours) at 07/18/2023 1325 Last data filed at 07/18/2023 1134 Gross per 24 hour  Intake 614.41 ml  Output 1350 ml  Net -735.59 ml   Filed Weights   07/06/23 1500 07/06/23 2145  Weight: 67.6 kg 67.5 kg   Physical Exam: General: Elderly-appearing, no acute distress HENT: [place], AT Eyes: EOMI, no scleral icterus Respiratory: Upper airway wheeze. Lungs clear to auscultation bilaterally.  No crackles, wheezing or rales Cardiovascular: RRR, -M/R/G, no JVD Extremities:-Edema,-tenderness Neuro: AAO x4, CNII-XII grossly  intact Psych: Anxious mood, normal affect  Assessment & Plan:   Acute respiratory due to asthmatic exacerbation triggered by sinusitis and further complicated by  subacute cough, throat irritation and respiratory distress due to VCD vs Laryngopharyngeal reflux disease (LPR)  Acute sinusitis She was seen by ENT who evaluated by DL and noted no vocal cord paralysis, good movement, no physical abnormalities. Improving but continues to have sensation of upper airway abnormality when coughing and then feels short of breath. May be triggered by sinusitis Plan Complete 7-10 day course of Ceftriaxone Ok with gentle diuresis CXR tomorrow Continue saline nasal, flonase and azelastine nasal therapy Cont gabapentin for cough suppression as well tessalon PRN Try to limit talking Cont Singulair and Claritin Continue steroids with plan to wean at discharge continue protonix bid and pepcid at bedtime Reflux precautions Cont BD therapy  Vocal rest PT evaluation Get her OOB today  Cont flutter and IS  History of obstructive sleep apnea. Plan Reassess as out pt  Asbestos related pleural plaques. - has more prominent pleural thickening Plan will need PET scan as an outpt to assess further and then determine if she needs pleural biopsy  Care Time: 50 min  Mechele Collin, M.D. [facility] Pulmonary/Critical Care Medicine 07/18/2023 1:26 PM   See Amion for personal pager For hours between 7 PM to 7 AM, please call Elink for urgent questions

## 2023-07-18 NOTE — Progress Notes (Signed)
PROGRESS NOTE  Linda [name]  DOB: 25-Mar-1946  PCP: Jeoffrey Massed, MD UJW:[id number]  DOA: 07/06/2023  LOS: 12 days  Hospital Day: 13  Brief narrative: Linda [name] is a 77 y.o. female with PMH significant for pulmonary asbestosis, pleural plaques moderate persistent asthma, chronic hypoxemic respiratory failure on 3 L home oxygen, vocal cord dysfunction, OSA, chronic HFpEF, history of PE, chronic pain, anxiety/depression, nonobstructive CAD, hypertension, idiopathic angioedema-urticaria, neuroleptic induced parkinsonism who lives in an independent living facility. 7/14, patient presented with complaint of several weeks of cough, shortness of breath treated with antibiotics, steroids and inhalers as an outpatient but continued to have symptoms. She was noted to be in severe respiratory distress requiring nonrebreather mask CT chest showed known heavy bilateral pleural calcifications due to asbestosis.  Admitted to [facility]  Pulmonary consulted. Patient was started on optimal management with IV steroids, IV antibiotics bronchodilators, antitussives. Her hospital course got prolonged due to persistent symptoms. 7/18, esophagram without strictures, could not evaluate for reflux. ENT was consulted as well. 7/19, ENT did fiberoptic exam of the pharynx and larynx which were unremarkable for any abnormal findings or mass.  Vocal cords were normally mobile. 7/20, CT maxillofacial was obtained which showed paranasal sinus disease with a small air-fluid level in the right sphenoid sinus consistent with acute sinusitis  Subjective: Patient was seen and examined this morning.  Propped up in bed.  Complains of bouts of cough making her oxygen saturation to dip down.  Ambulated on the hallway and maintained O2 sat over 90% on room air. Does not feel convinced enough to be discharged.  PCCM follow-up appreciated.  Assessment and plan: Acute on chronic hypoxic respiratory failure Acute  exacerbation of moderate persistent asthma Acute sinusitis Based on patient's progression of symptoms, imagings normal direct laryngoscopy findings and response to treatment, the hypothesis at this time is that patient probably had asthma exacerbation triggered by sinusitis  Currently on IV Rocephin Currently on bronchodilators, antihistamine, Singulair, Flonase, azelastine, sinus saline rinses Chronically on 3 L oxygen by nasal cannula.  Check ambulatory oxygen requirement today. For cough, she is currently on scheduled Tessalon Perles.   Complains of bouts of cough making her oxygen saturation to dip down.  Ambulated on the hallway and maintained O2 sat over 90% on room air. Does not feel convinced enough to be discharged.  PCCM follow-up appreciated.  Will give her a day to complete the course of IV Rocephin Encourage ambulation, use of incentive spirometry, flutter valve  Chronic pulmonary asbestosis Chronic pleural plaques Follow-up with pulmonary as an outpatient  Agitation Chronic pain with possible opiate dependence Anxiety/depression  Continue current regimen with Cymbalta, as needed Xanax, as needed IV Haldol Outpatient follow-up with PCP/pain management   Chronic diastolic CHF Hypertension CAD/HLD Most recent echo from February 2023 with EF 70 to 75%, grade 1 diastolic dysfunction No anginal symptoms currently.   Continue Toprol, Imdur, amlodipine, aspirin and statin Will start her on Lasix 20 mg daily which hopefully will improve her breathing.  OSA  Not on CPAP at home.  Will need outpatient sleep study   Hypokalemia Potassium level improved with replacement. Recent Labs  Lab 07/13/23 0306 07/16/23 0411  K 4.4 3.6  MG 2.5* 2.5*   Physical deconditioning PT eval obtained.   HHPT recommended.  Goals of care   Code Status: DNR     DVT prophylaxis:  enoxaparin (LOVENOX) injection 40 mg Start: 07/06/23 2200   Antimicrobials: IV Rocephin Fluid:  None Consultants: PCCM  Family Communication:   Status: Inpatient Level of care:  Progressive   Patient from: Home Anticipated d/c to: Hopefully home tomorrow if cough improves Needs to continue in-hospital care:  Improving respiratory status, continues to have significant cough.  Concerned about bouts of desaturation   Diet:  Diet Order             Diet regular Room service appropriate? Yes; Fluid consistency: Thin  Diet effective now                   Scheduled Meds:  ALPRAZolam  0.5 mg Oral TID   amLODipine  5 mg Oral Daily   arformoterol  15 mcg Nebulization BID   aspirin EC  81 mg Oral QHS   azelastine  1 spray Each Nare q morning   benzonatate  200 mg Oral TID   budesonide (PULMICORT) nebulizer solution  0.25 mg Nebulization BID   DULoxetine  30 mg Oral Daily   enoxaparin (LOVENOX) injection  40 mg Subcutaneous Q24H   famotidine  20 mg Oral QHS   fluticasone  1 spray Each Nare QHS   furosemide  20 mg Oral Daily   gabapentin  100 mg Oral BID   guaiFENesin  1,200 mg Oral BID   isosorbide mononitrate  60 mg Oral BID   lamoTRIgine  200 mg Oral Daily   loratadine  10 mg Oral Daily   methylPREDNISolone (SOLU-MEDROL) injection  40 mg Intravenous Daily   metoprolol succinate  100 mg Oral Daily   montelukast  10 mg Oral QHS   multivitamin with minerals  1 tablet Oral Daily   nystatin  5 mL Oral QID   pantoprazole  40 mg Oral BID AC   polyethylene glycol  17 g Oral BID   revefenacin  175 mcg Nebulization Daily   rosuvastatin  40 mg Oral QPM   senna-docusate  1 tablet Oral BID   sodium chloride  2 spray Each Nare BID   traZODone  100-200 mg Oral QHS    PRN meds: sodium chloride, albuterol, Glycerin (Adult), haloperidol lactate, hydrALAZINE, methocarbamol, ondansetron (ZOFRAN) IV, mouth rinse, oxyCODONE-acetaminophen **AND** oxyCODONE, phenol   Infusions:   sodium chloride     cefTRIAXone (ROCEPHIN)  IV 2 g (07/18/23 0917)    Antimicrobials: Anti-infectives  (From admission, onward)    Start     Dose/Rate Route Frequency Ordered Stop   07/13/23 0745  cefTRIAXone (ROCEPHIN) 2 g in sodium chloride 0.9 % 100 mL IVPB        2 g 200 mL/hr over 30 Minutes Intravenous Daily 07/13/23 0733     07/07/23 1030  azithromycin (ZITHROMAX) tablet 500 mg        500 mg Oral Daily 07/07/23 0930 07/11/23 0905       Nutritional status:  Body mass index is 25.54 kg/m.          Objective: Vitals:   07/18/23 0422 07/18/23 0826  BP: (!) 145/72   Pulse: 69   Resp: 18   Temp: 97.9 F (36.6 C)   SpO2: 98% 98%    Intake/Output Summary (Last 24 hours) at 07/18/2023 1231 Last data filed at 07/18/2023 1134 Gross per 24 hour  Intake 614.41 ml  Output 1350 ml  Net -735.59 ml   Filed Weights   07/06/23 1500 07/06/23 2145  Weight: 67.6 kg 67.5 kg   Weight change:  Body mass index is 25.54 kg/m.   Physical Exam: General exam: Pleasant elderly Caucasian female. Skin: No  rashes, lesions or ulcers. HEENT: Atraumatic, normocephalic, no obvious bleeding Lungs: No wheezing but coughs on deep breathing CVS: Mild tachycardia, no murmur GI/Abd soft, nontender, nondistended, bowel sound present CNS: Alert, awake, oriented x 3 Psychiatry: Mood appropriate Extremities: No pedal edema, no calf tenderness  Data Review: I have personally reviewed the laboratory data and studies available.  F/u labs ordered Unresulted Labs (From admission, onward)     Start     Ordered   07/12/23 1033  Expectorated Sputum Assessment w Gram Stain, Rflx to Resp Cult  Once,   R        07/12/23 1032            Total time spent in review of labs and imaging, patient evaluation, formulation of plan, documentation and communication with family: 45 minutes  Signed, Lorin Glass, MD [facility] 07/18/2023

## 2023-07-19 DIAGNOSIS — J9621 Acute and chronic respiratory failure with hypoxia: Secondary | ICD-10-CM | POA: Diagnosis not present

## 2023-07-19 MED ORDER — PREDNISONE 10 MG PO TABS: 10.0000 mg | ORAL_TABLET | Freq: Every day | ORAL | Status: DC

## 2023-07-19 MED ORDER — PREDNISONE 20 MG PO TABS
20.0000 mg | ORAL_TABLET | Freq: Every day | ORAL | Status: AC
  Administered 2023-07-20 – 2023-07-21 (×2): 20 mg via ORAL
  Filled 2023-07-19 (×2): qty 1

## 2023-07-19 NOTE — Progress Notes (Addendum)
   NAME:  Linda [name], MRN:  [id number], DOB:  25-Aug-1946, LOS: 13 ADMISSION DATE:  07/06/2023, CONSULTATION DATE: 07/06/2023 REFERRING MD: Dr. Loney Loh, CHIEF COMPLAINT: Respiratory distress  History of Present Illness:  77 yo female with hx of asbestos exposure and pleural plaques, chronic respiratory failure with hypoxia, and vocal cord dysfunction started having trouble with her breathing about 6 weeks ago. She has cough and throat irritation. Gets a rattling in her throat. Gets anxious when she has trouble with her breathing. Nebulizer treatments helps a little.   Pertinent  Medical History  Upper GI bleeding, Anxiety, Vertigo, CAD, HFpEF, Back pain, [name], Diverticulosis, Fibromyalgia, Hiatal hernia, Pneumonia, HTN, Idiopathic urticaria, Insomnia, IDA, Migraine headache, Nephrolithiasis, OSA, Pernicious anemia, PE 2014, Pyelonephritis, RBBB, Depression  Studies:  CT chest 07/06/23 >> heavy bilateral pleural calcifications with associated nodular pleural and subpleural thickening most prominent in Lt lower lobe  Interim History / Subjective:  Intermittently has upper airway wheeze and cough when talking Feels better Does not want to go home  Objective   Blood pressure (!) 156/67, pulse 70, temperature 98 F (36.7 C), temperature source Oral, resp. rate 19, height 5\' 4"  (1.626 m), weight 67.5 kg, SpO2 97%.        Intake/Output Summary (Last 24 hours) at 07/19/2023 0905 Last data filed at 07/19/2023 0500 Gross per 24 hour  Intake 100 ml  Output 1900 ml  Net -1800 ml   Filed Weights   07/06/23 1500 07/06/23 2145  Weight: 67.6 kg 67.5 kg   Physical Exam: General: Well-appearing, no acute distress HENT: [place], AT Eyes: EOMI, no scleral icterus Respiratory: Upper airway wheeze. Clear to auscultation bilaterally.  No crackles, wheezing or rales Cardiovascular: RRR, -M/R/G, no JVD Extremities:-Edema,-tenderness Neuro: AAO x4, CNII-XII grossly intact Psych: Normal mood, normal  affect  CXR 07/18/23 - No acute infiltrate effusion or edema  Assessment & Plan:   Acute respiratory distress due to asthmatic exacerbation triggered by sinusitis and further complicated by  subacute cough, throat irritation and respiratory distress due to VCD vs Laryngopharyngeal reflux disease (LPR)  Acute sinusitis She was seen by ENT who evaluated by DL and noted no vocal cord paralysis, good movement, no physical abnormalities. Improving but continues to have sensation of upper airway abnormality when coughing and then feels short of breath. May be triggered by sinusitis 7/27 Clinically improved. On baseline home O2 3L. CXR with no acute abnormalities Plan Complete 7-10 day course of Ceftriaxone Continue saline nasal, flonase and azelastine nasal therapy Cont gabapentin for cough suppression as well tessalon PRN Try to limit talking Cont Singulair and Claritin Prednisone reduced to 20 mg daily to decrease agitation/anxiety. Will provide taper for discharge Protonix bid and pepcid at bedtime Reflux precautions Cont BD therapy  Vocal rest PT evaluation Get her OOB today  Cont flutter and IS  History of obstructive sleep apnea. Plan Reassess as out pt  Asbestos related pleural plaques. - has more prominent pleural thickening Plan will need PET scan as an outpt to assess further and then determine if she needs pleural biopsy  Pulmonary available as needed. Will place telephone note to arrange for pulmonary outpatient follow-up.  >Addendum: Patient follows with [facility]. Will follow-up with her primary pulmonologist  Care Time:  35 min  Mechele Collin, M.D. [facility] Pulmonary/Critical Care Medicine 07/19/2023 9:05 AM   See Amion for personal pager For hours between 7 PM to 7 AM, please call Elink for urgent questions

## 2023-07-19 NOTE — Progress Notes (Signed)
PROGRESS NOTE  [name]  DOB: 02-14-1946  PCP: Jeoffrey Massed, MD ZOX:[id number]  DOA: 07/06/2023  LOS: 13 days  Hospital Day: 14  Brief narrative: Linda [name] is a 77 y.o. female with PMH significant for pulmonary asbestosis, pleural plaques moderate persistent asthma, chronic hypoxemic respiratory failure on 3 L home oxygen, vocal cord dysfunction, OSA, chronic HFpEF, history of PE, chronic pain, anxiety/depression, nonobstructive CAD, hypertension, idiopathic angioedema-urticaria, neuroleptic induced parkinsonism who lives in an independent living facility. 7/14, patient presented with complaint of several weeks of cough, shortness of breath treated with antibiotics, steroids and inhalers as an outpatient but continued to have symptoms. She was noted to be in severe respiratory distress requiring nonrebreather mask CT chest showed known heavy bilateral pleural calcifications due to asbestosis.  Admitted to [facility]  Pulmonary consulted. Patient was started on optimal management with IV steroids, IV antibiotics bronchodilators, antitussives. Her hospital course got prolonged due to persistent symptoms. 7/18, esophagram without strictures, could not evaluate for reflux. ENT was consulted as well. 7/19, ENT did fiberoptic exam of the pharynx and larynx which were unremarkable for any abnormal findings or mass.  Vocal cords were normally mobile. 7/20, CT maxillofacial was obtained which showed paranasal sinus disease with a small air-fluid level in the right sphenoid sinus consistent with acute sinusitis  Subjective: Patient was seen and examined this morning.  Propped up in bed. Breathing is better.  Lung auscultation much better with no wheezing or crackles.  Has intermittent episodes of cough but able to take deep breath without coughing today Still complains that she is not feeling better and states she is going to appeal the discharge. I called and updated her son as  well from bedside. Pulmonary follow-up appreciated.  Assessment and plan: Acute on chronic hypoxic respiratory failure Acute exacerbation of moderate persistent asthma Acute sinusitis Based on patient's progression of symptoms, imagings normal direct laryngoscopy findings and response to treatment, the hypothesis at this time is that patient probably had asthma exacerbation triggered by sinusitis  Currently on IV Rocephin Currently on bronchodilators, antihistamine, Singulair, Flonase, azelastine, sinus saline rinses For cough, she is currently on scheduled Tessalon Perles.   Ambulated on the hallway and maintained O2 sat over 90% on room air. Pulmonary follow-up appreciated. Completes 7-day course of IV Rocephin today. She did not qualify for supplemental oxygen on ambulation.  However she is scared to be discharged without oxygen.  She may have to privately pay for it.  Case management in touch with home health company to get the home oxygen arrangement. I have asked RN to ambulate around the hallway today to check for symptom progression and oxygen requirement. Encourage the use of incentive spirometry, flutter valve  Chronic pulmonary asbestosis Chronic pleural plaques Follow-up with pulmonary as an outpatient  Agitation Chronic pain with possible opiate dependence Anxiety/depression  Continue current regimen with Cymbalta, as needed Xanax, as needed IV Haldol Outpatient follow-up with PCP/pain management   Chronic diastolic CHF Hypertension CAD/HLD Most recent echo from February 2023 with EF 70 to 75%, grade 1 diastolic dysfunction No anginal symptoms currently.   Continue Toprol, Imdur, amlodipine, aspirin and statin She has also been started on Lasix 20 mg daily which hopefully will improve her breathing.  OSA  Not on CPAP at home.  Will need outpatient sleep study   Hypokalemia Potassium level improved with replacement. Recent Labs  Lab 07/13/23 0306 07/16/23 0411   K 4.4 3.6  MG 2.5* 2.5*   Physical deconditioning  PT eval obtained.   HHPT recommended.  Goals of care   Code Status: DNR     DVT prophylaxis:  enoxaparin (LOVENOX) injection 40 mg Start: 07/06/23 2200   Antimicrobials: IV Rocephin Fluid: None Consultants: PCCM Family Communication:   Status: Inpatient Level of care:  Progressive   Patient from: Home Anticipated d/c to: Planning to discharge today once social work was able to confirm home oxygen availability.   Diet:  Diet Order             Diet regular Room service appropriate? Yes; Fluid consistency: Thin  Diet effective now                   Scheduled Meds:  ALPRAZolam  0.5 mg Oral TID   amLODipine  5 mg Oral Daily   arformoterol  15 mcg Nebulization BID   aspirin EC  81 mg Oral QHS   azelastine  1 spray Each Nare q morning   benzonatate  200 mg Oral TID   budesonide (PULMICORT) nebulizer solution  0.25 mg Nebulization BID   DULoxetine  30 mg Oral Daily   enoxaparin (LOVENOX) injection  40 mg Subcutaneous Q24H   famotidine  20 mg Oral QHS   fluticasone  1 spray Each Nare QHS   furosemide  20 mg Oral Daily   gabapentin  100 mg Oral BID   guaiFENesin  1,200 mg Oral BID   isosorbide mononitrate  60 mg Oral BID   lamoTRIgine  200 mg Oral Daily   loratadine  10 mg Oral Daily   metoprolol succinate  100 mg Oral Daily   montelukast  10 mg Oral QHS   multivitamin with minerals  1 tablet Oral Daily   nystatin  5 mL Oral QID   pantoprazole  40 mg Oral BID AC   polyethylene glycol  17 g Oral BID   [START ON 07/20/2023] predniSONE  20 mg Oral Q breakfast   Followed by   Melene Muller ON 07/22/2023] predniSONE  10 mg Oral Q breakfast   revefenacin  175 mcg Nebulization Daily   rosuvastatin  40 mg Oral QPM   senna-docusate  1 tablet Oral BID   sodium chloride  2 spray Each Nare BID   traZODone  100-200 mg Oral QHS    PRN meds: sodium chloride, albuterol, Glycerin (Adult), haloperidol lactate, hydrALAZINE,  methocarbamol, ondansetron (ZOFRAN) IV, mouth rinse, oxyCODONE-acetaminophen **AND** oxyCODONE, phenol   Infusions:   sodium chloride     cefTRIAXone (ROCEPHIN)  IV 2 g (07/19/23 1102)    Antimicrobials: Anti-infectives (From admission, onward)    Start     Dose/Rate Route Frequency Ordered Stop   07/13/23 0745  cefTRIAXone (ROCEPHIN) 2 g in sodium chloride 0.9 % 100 mL IVPB        2 g 200 mL/hr over 30 Minutes Intravenous Daily 07/13/23 0733     07/07/23 1030  azithromycin (ZITHROMAX) tablet 500 mg        500 mg Oral Daily 07/07/23 0930 07/11/23 0905       Nutritional status:  Body mass index is 25.54 kg/m.          Objective: Vitals:   07/19/23 0622 07/19/23 1150  BP:  131/71  Pulse:  72  Resp:  19  Temp:  97.9 F (36.6 C)  SpO2: 97% 95%    Intake/Output Summary (Last 24 hours) at 07/19/2023 1425 Last data filed at 07/19/2023 1359 Gross per 24 hour  Intake 340 ml  Output  2950 ml  Net -2610 ml   Filed Weights   07/06/23 1500 07/06/23 2145  Weight: 67.6 kg 67.5 kg   Weight change:  Body mass index is 25.54 kg/m.   Physical Exam: General exam: Pleasant elderly Caucasian female. Skin: No rashes, lesions or ulcers. HEENT: Atraumatic, normocephalic, no obvious bleeding Lungs: No wheezing, no cough on deep breathing today CVS: Regular rate and rhythm, no murmur GI/Abd soft, nontender, nondistended, bowel sound present CNS: Alert, awake, oriented x 3 Psychiatry: Anxious Extremities: No pedal edema, no calf tenderness  Data Review: I have personally reviewed the laboratory data and studies available.  F/u labs ordered Unresulted Labs (From admission, onward)    None      Total time spent in review of labs and imaging, patient evaluation, formulation of plan, documentation and communication with family: 45 minutes  Signed, Lorin Glass, MD [facility] 07/19/2023

## 2023-07-19 NOTE — TOC Progression Note (Signed)
Transition of Care [facility]) - Progression Note    Patient Details  Name: Linda [name] MRN: [id number] Date of Birth: February 07, 1946  Transition of Care [facility]) CM/SW Contact  Carmina Miller, LCSWA Phone Number: 07/19/2023, 4:18 PM  Clinical Narrative:     1:50 pm-spoke with Selena Batten at [employer] to confirm pt's concentrator was serviced yesterday, she states she had to reach out to logistics to verify.   4:19 pm-CSW reached out to Kym again to check the status of the service, no answer, vm left.   Expected Discharge Plan: Home w Home Health Services Barriers to Discharge: Continued Medical Work up  Expected Discharge Plan and Services       Living arrangements for the past 2 months: Independent Living Facility                                       Social Determinants of Health (SDOH) Interventions SDOH Screenings   Food Insecurity: No Food Insecurity (07/07/2023)  Housing: Low Risk  (07/07/2023)  Transportation Needs: No Transportation Needs (07/07/2023)  Utilities: Not At Risk (07/07/2023)  Alcohol Screen: Low Risk  (01/01/2023)  Depression (PHQ2-9): Low Risk  (07/02/2023)  Recent Concern: Depression (PHQ2-9) - High Risk (05/01/2023)  Financial Resource Strain: Low Risk  (01/01/2023)  Physical Activity: Insufficiently Active (01/01/2023)  Social Connections: Moderately Isolated (01/01/2023)  Stress: No Stress Concern Present (01/01/2023)  Tobacco Use: Low Risk  (07/06/2023)  Recent Concern: Tobacco Use - Medium Risk (06/03/2023)   [facility]    Readmission Risk Interventions    07/16/2023    9:36 AM  Readmission Risk Prevention Plan  Transportation Screening Complete  Medication Review (RN Care Manager) Complete  PCP or Specialist appointment within 3-5 days of discharge Complete  HRI or Home Care Consult Complete  SW Recovery Care/Counseling Consult Complete  Palliative Care Screening Not Applicable  Skilled Nursing Facility Not Applicable

## 2023-07-19 NOTE — Plan of Care (Signed)
  Problem: Clinical Measurements: Goal: Ability to maintain clinical measurements within normal limits will improve Outcome: Progressing Goal: Diagnostic test results will improve Outcome: Progressing   Problem: Safety: Goal: Ability to remain free from injury will improve Outcome: Progressing   

## 2023-07-20 DIAGNOSIS — J9621 Acute and chronic respiratory failure with hypoxia: Secondary | ICD-10-CM | POA: Diagnosis not present

## 2023-07-20 MED ORDER — FENTANYL 12 MCG/HR TD PT72
1.0000 | MEDICATED_PATCH | TRANSDERMAL | Status: DC
  Administered 2023-07-20: 1 via TRANSDERMAL
  Filled 2023-07-20: qty 1

## 2023-07-20 NOTE — TOC Progression Note (Signed)
Transition of Care [facility]) - Progression Note    Patient Details  Name: Linda [name] MRN: [id number] Date of Birth: 1946-04-30  Transition of Care [facility]) CM/SW Contact  Carmina Miller, [place] Phone Number: 07/20/2023, 2:51 PM  Clinical Narrative:     CSW spoke with Selena Batten at [employer] in regards to pt's concentrator being serviced, she states it was not serviced yesterday but it is on the schedule to be serviced today, could not provide an ETA. TOC will continue to follow.    Expected Discharge Plan: Home w Home Health Services Barriers to Discharge: Continued Medical Work up  Expected Discharge Plan and Services       Living arrangements for the past 2 months: Independent Living Facility                                       Social Determinants of Health (SDOH) Interventions SDOH Screenings   Food Insecurity: No Food Insecurity (07/07/2023)  Housing: Low Risk  (07/07/2023)  Transportation Needs: No Transportation Needs (07/07/2023)  Utilities: Not At Risk (07/07/2023)  Alcohol Screen: Low Risk  (01/01/2023)  Depression (PHQ2-9): Low Risk  (07/02/2023)  Recent Concern: Depression (PHQ2-9) - High Risk (05/01/2023)  Financial Resource Strain: Low Risk  (01/01/2023)  Physical Activity: Insufficiently Active (01/01/2023)  Social Connections: Moderately Isolated (01/01/2023)  Stress: No Stress Concern Present (01/01/2023)  Tobacco Use: Low Risk  (07/06/2023)  Recent Concern: Tobacco Use - Medium Risk (06/03/2023)   [facility]    Readmission Risk Interventions    07/16/2023    9:36 AM  Readmission Risk Prevention Plan  Transportation Screening Complete  Medication Review (RN Care Manager) Complete  PCP or Specialist appointment within 3-5 days of discharge Complete  HRI or Home Care Consult Complete  SW Recovery Care/Counseling Consult Complete  Palliative Care Screening Not Applicable  Skilled Nursing Facility Not Applicable

## 2023-07-20 NOTE — Progress Notes (Addendum)
PROGRESS NOTE  Linda [name]  DOB: 03/03/1946  PCP: Jeoffrey Massed, MD ZOX:[id number]  DOA: 07/06/2023  LOS: 14 days  Hospital Day: 15  Brief narrative: Linda [name] is a 77 y.o. female with PMH significant for pulmonary asbestosis, pleural plaques moderate persistent asthma, chronic hypoxemic respiratory failure on 3 L home oxygen, vocal cord dysfunction, OSA, chronic HFpEF, history of PE, chronic pain, anxiety/depression, nonobstructive CAD, hypertension, idiopathic angioedema-urticaria, neuroleptic induced parkinsonism who lives in an independent living facility. 7/14, patient presented with complaint of several weeks of cough, shortness of breath treated with antibiotics, steroids and inhalers as an outpatient but continued to have symptoms. She was noted to be in severe respiratory distress requiring nonrebreather mask CT chest showed known heavy bilateral pleural calcifications due to asbestosis.  Admitted to [facility]  Pulmonary consulted. Patient was started on optimal management with IV steroids, IV antibiotics bronchodilators, antitussives. Her hospital course got prolonged due to persistent symptoms. 7/18, esophagram without strictures, could not evaluate for reflux. ENT was consulted as well. 7/19, ENT did fiberoptic exam of the pharynx and larynx which were unremarkable for any abnormal findings or mass.  Vocal cords were normally mobile. 7/20, CT maxillofacial was obtained which showed paranasal sinus disease with a small air-fluid level in the right sphenoid sinus consistent with acute sinusitis  Subjective: Patient was seen and examined this morning.  I saw her walking on the hallway with PT.  Per PT done, she maintained O2 sat over 89% on room air.  Reports she prefers to keep oxygen on because he is anxious about recurrence of of cough leading to hypoxia. Patient has oxygen concentrator at home which is not working and somebody is out there to service it.  She is  not sure if it has been serviced.  Case manager and could not confirm from [facility] if it has been fixed. She is genuinely afraid to go home without oxygen back up and is stating that she will appeal the discharge. Unable to discharge at this time until she has an oxygen backup.  Assessment and plan: Acute on chronic hypoxic respiratory failure Acute exacerbation of moderate persistent asthma Acute sinusitis Based on patient's progression of symptoms, imagings normal direct laryngoscopy findings and response to treatment, the hypothesis at this time is that patient probably had asthma exacerbation triggered by sinusitis  Currently on IV Rocephin Currently on bronchodilators, antihistamine, Singulair, Flonase, azelastine, sinus saline rinses For cough, she is currently on scheduled Tessalon Perles.   Ambulated on the hallway and maintained O2 sat over 90% on room air. Pulmonary follow-up appreciated. Completes 7-day course of IV Rocephin today. She did not qualify for supplemental oxygen on ambulation.  However she is scared to be discharged without oxygen.  She may have to privately pay for it.  Case management in touch with home health company to get the home oxygen arrangement. Encourage the use of incentive spirometry, flutter valve  Chronic pulmonary asbestosis Chronic pleural plaques Follow-up with pulmonary as an outpatient  Agitation Chronic pain with possible opiate dependence Anxiety/depression  Continue current regimen with Cymbalta, as needed Xanax, as needed IV Haldol Outpatient follow-up with PCP/pain management   Chronic diastolic CHF Hypertension CAD/HLD Most recent echo from February 2023 with EF 70 to 75%, grade 1 diastolic dysfunction No anginal symptoms currently.   Continue Toprol, Imdur, amlodipine, aspirin and statin She has also been started on Lasix 20 mg daily which hopefully will improve her breathing.  OSA  Not on CPAP  at home.  Will need outpatient  sleep study   Hypokalemia Potassium level improved with replacement. Recent Labs  Lab 07/16/23 0411  K 3.6  MG 2.5*   Physical deconditioning PT eval obtained.   HHPT recommended.  Osteoarthritis She stated that she was supposed to be on fentanyl patch.  I ordered it at her request today.  Goals of care   Code Status: DNR     DVT prophylaxis:  enoxaparin (LOVENOX) injection 40 mg Start: 07/06/23 2200   Antimicrobials: IV Rocephin Fluid: None Consultants: PCCM Family Communication:   Status: Inpatient Level of care:  Progressive   Patient from: Home Anticipated d/c to: Planning to discharge once social work was able to confirm home oxygen availability.   Diet:  Diet Order             Diet regular Room service appropriate? Yes; Fluid consistency: Thin  Diet effective now                   Scheduled Meds:  ALPRAZolam  0.5 mg Oral TID   amLODipine  5 mg Oral Daily   arformoterol  15 mcg Nebulization BID   aspirin EC  81 mg Oral QHS   azelastine  1 spray Each Nare q morning   benzonatate  200 mg Oral TID   budesonide (PULMICORT) nebulizer solution  0.25 mg Nebulization BID   DULoxetine  30 mg Oral Daily   enoxaparin (LOVENOX) injection  40 mg Subcutaneous Q24H   famotidine  20 mg Oral QHS   fentaNYL  1 patch Transdermal Q72H   fluticasone  1 spray Each Nare QHS   furosemide  20 mg Oral Daily   gabapentin  100 mg Oral BID   guaiFENesin  1,200 mg Oral BID   isosorbide mononitrate  60 mg Oral BID   lamoTRIgine  200 mg Oral Daily   loratadine  10 mg Oral Daily   metoprolol succinate  100 mg Oral Daily   montelukast  10 mg Oral QHS   multivitamin with minerals  1 tablet Oral Daily   nystatin  5 mL Oral QID   pantoprazole  40 mg Oral BID AC   polyethylene glycol  17 g Oral BID   predniSONE  20 mg Oral Q breakfast   Followed by   Melene Muller ON 07/22/2023] predniSONE  10 mg Oral Q breakfast   revefenacin  175 mcg Nebulization Daily   rosuvastatin  40 mg  Oral QPM   senna-docusate  1 tablet Oral BID   sodium chloride  2 spray Each Nare BID   traZODone  100-200 mg Oral QHS    PRN meds: sodium chloride, albuterol, Glycerin (Adult), haloperidol lactate, hydrALAZINE, methocarbamol, ondansetron (ZOFRAN) IV, mouth rinse, oxyCODONE-acetaminophen **AND** oxyCODONE, phenol   Infusions:   sodium chloride     cefTRIAXone (ROCEPHIN)  IV 2 g (07/20/23 1033)    Antimicrobials: Anti-infectives (From admission, onward)    Start     Dose/Rate Route Frequency Ordered Stop   07/13/23 0745  cefTRIAXone (ROCEPHIN) 2 g in sodium chloride 0.9 % 100 mL IVPB        2 g 200 mL/hr over 30 Minutes Intravenous Daily 07/13/23 0733     07/07/23 1030  azithromycin (ZITHROMAX) tablet 500 mg        500 mg Oral Daily 07/07/23 0930 07/11/23 0905       Nutritional status:  Body mass index is 25.54 kg/m.  Objective: Vitals:   07/20/23 0509 07/20/23 1403  BP: 123/84 (!) 104/55  Pulse: 77 78  Resp: 18 15  Temp: 98.2 F (36.8 C) (!) 97.4 F (36.3 C)  SpO2: 96% 95%    Intake/Output Summary (Last 24 hours) at 07/20/2023 1520 Last data filed at 07/20/2023 1300 Gross per 24 hour  Intake 480 ml  Output 925 ml  Net -445 ml   Filed Weights   07/06/23 1500 07/06/23 2145  Weight: 67.6 kg 67.5 kg   Weight change:  Body mass index is 25.54 kg/m.   Physical Exam: General exam: Pleasant elderly Caucasian female. Skin: No rashes, lesions or ulcers. HEENT: Atraumatic, normocephalic, no obvious bleeding Lungs: No wheezing, no cough on deep breathing today CVS: Regular rate and rhythm, no murmur GI/Abd soft, nontender, nondistended, bowel sound present CNS: Alert, awake, oriented x 3 Psychiatry: Anxious Extremities: No pedal edema, no calf tenderness  Data Review: I have personally reviewed the laboratory data and studies available.  F/u labs ordered Unresulted Labs (From admission, onward)    None      Total time spent in review of  labs and imaging, patient evaluation, formulation of plan, documentation and communication with family: 25 minutes  Signed, Lorin Glass, MD [facility] 07/20/2023

## 2023-07-20 NOTE — Progress Notes (Signed)
Physical Therapy Treatment Patient Details Name: Linda [name] MRN: [id number] DOB: 08/28/1946 Today's Date: 07/20/2023   History of Present Illness Pt admitted from [name] on 07/06/23 secondary to SOB and dx with acute on chronic respiratory failure and vocal cord dysfunction.  Pt with hx of BPPV, CHF, DDD, Fibromyalgia, RBBB, syncope, pulmonary asbestosis, and T12 compression fx    PT Comments  Pt very pleasant and ambulated in hallway, able to perform 400 feet with walker.  Pt anticipates d/c soon.  Pt able to ambulate on room air and SpO2 89-91% during ambulation.  Pt appears to prefer oxygen and plans to defer to her regular pulmonologist for oxygen recommendations.  Encouraged pt f/u with her pulmonologist and discuss her SPO2 and current oxygen needs.     Assistance Recommended at Discharge Intermittent Supervision/Assistance  If plan is discharge home, recommend the following:  Can travel by private vehicle    A little help with bathing/dressing/bathroom;Assistance with cooking/housework;Help with stairs or ramp for entrance;Assist for transportation;A little help with walking and/or transfers      Equipment Recommendations  None recommended by PT    Recommendations for Other Services       Precautions / Restrictions Precautions Precautions: Fall Precaution Comments: monitor sats     Mobility  Bed Mobility Overal bed mobility: Modified Independent                  Transfers Overall transfer level: Needs assistance Equipment used: Rolling walker (2 wheels) Transfers: Sit to/from Stand Sit to Stand: Supervision                Ambulation/Gait Ambulation/Gait assistance: Supervision Gait Distance (Feet): 400 Feet Assistive device: Rolling walker (2 wheels) Gait Pattern/deviations: Step-through pattern, Decreased stride length       General Gait Details: slow but steady pace, dyspnea 2/4, SpO2 at lowest 89% on room air, mostly 90-91%;  reapplied O2 [place] upon return to bed per pt preference   Stairs             Wheelchair Mobility     Tilt Bed    Modified Rankin (Stroke Patients Only)       Balance                                            Cognition Arousal/Alertness: Awake/alert Behavior During Therapy: WFL for tasks assessed/performed Overall Cognitive Status: Within Functional Limits for tasks assessed                                          Exercises      General Comments        Pertinent Vitals/Pain Pain Assessment Pain Assessment: No/denies pain    Home Living                          Prior Function            PT Goals (current goals can now be found in the care plan section) Progress towards PT goals: Progressing toward goals    Frequency    Min 1X/week      PT Plan Current plan remains appropriate    Co-evaluation  AM-PAC PT "6 Clicks" Mobility   Outcome Measure  Help needed turning from your back to your side while in a flat bed without using bedrails?: None Help needed moving from lying on your back to sitting on the side of a flat bed without using bedrails?: None Help needed moving to and from a bed to a chair (including a wheelchair)?: None Help needed standing up from a chair using your arms (e.g., wheelchair or bedside chair)?: A Little Help needed to walk in hospital room?: A Little Help needed climbing 3-5 steps with a railing? : A Little 6 Click Score: 21    End of Session Equipment Utilized During Treatment: Gait belt Activity Tolerance: Patient tolerated treatment well Patient left: in bed;with call bell/phone within reach;with bed alarm set Nurse Communication: Mobility status PT Visit Diagnosis: Difficulty in walking, not elsewhere classified (R26.2)     Time: 1005-1025 PT Time Calculation (min) (ACUTE ONLY): 20 min  Charges:    $Gait Training: 8-22 mins PT General  Charges $$ ACUTE PT VISIT: 1 Visit          Paulino Door, DPT Physical Therapist Acute Rehabilitation Services Office: [phone]    Janan Halter [place] 07/20/2023, 1:27 PM

## 2023-07-21 ENCOUNTER — Other Ambulatory Visit (HOSPITAL_COMMUNITY): Payer: Self-pay

## 2023-07-21 ENCOUNTER — Encounter: Payer: Self-pay | Admitting: Hematology & Oncology

## 2023-07-21 DIAGNOSIS — J9621 Acute and chronic respiratory failure with hypoxia: Secondary | ICD-10-CM | POA: Diagnosis not present

## 2023-07-21 DIAGNOSIS — J4551 Severe persistent asthma with (acute) exacerbation: Secondary | ICD-10-CM

## 2023-07-21 DIAGNOSIS — J4541 Moderate persistent asthma with (acute) exacerbation: Secondary | ICD-10-CM | POA: Diagnosis not present

## 2023-07-21 MED ORDER — LORATADINE 10 MG PO TABS
10.0000 mg | ORAL_TABLET | Freq: Every day | ORAL | 0 refills | Status: DC | PRN
  Filled 2023-07-21: qty 30, 30d supply, fill #0

## 2023-07-21 MED ORDER — BREZTRI AEROSPHERE 160-9-4.8 MCG/ACT IN AERO
2.0000 | INHALATION_SPRAY | Freq: Two times a day (BID) | RESPIRATORY_TRACT | 0 refills | Status: DC
  Filled 2023-07-21: qty 10.7, 30d supply, fill #0

## 2023-07-21 MED ORDER — MOMETASONE FURO-FORMOTEROL FUM 200-5 MCG/ACT IN AERO
2.0000 | INHALATION_SPRAY | Freq: Two times a day (BID) | RESPIRATORY_TRACT | Status: DC
  Filled 2023-07-21: qty 8.8

## 2023-07-21 MED ORDER — GABAPENTIN 100 MG PO CAPS
100.0000 mg | ORAL_CAPSULE | Freq: Two times a day (BID) | ORAL | 0 refills | Status: DC
  Filled 2023-07-21: qty 60, 30d supply, fill #0

## 2023-07-21 MED ORDER — FLUTICASONE PROPIONATE 50 MCG/ACT NA SUSP
1.0000 | Freq: Every day | NASAL | 0 refills | Status: DC
  Filled 2023-07-21: qty 16, 30d supply, fill #0

## 2023-07-21 MED ORDER — MONTELUKAST SODIUM 10 MG PO TABS
10.0000 mg | ORAL_TABLET | Freq: Every day | ORAL | 0 refills | Status: DC
  Filled 2023-07-21: qty 30, 30d supply, fill #0

## 2023-07-21 MED ORDER — PREDNISONE 10 MG PO TABS
10.0000 mg | ORAL_TABLET | Freq: Every day | ORAL | 0 refills | Status: AC
  Filled 2023-07-21: qty 3, 3d supply, fill #0

## 2023-07-21 MED ORDER — SENNOSIDES-DOCUSATE SODIUM 8.6-50 MG PO TABS
1.0000 | ORAL_TABLET | Freq: Two times a day (BID) | ORAL | 0 refills | Status: AC
  Filled 2023-07-21: qty 60, 30d supply, fill #0

## 2023-07-21 MED ORDER — FUROSEMIDE 20 MG PO TABS
20.0000 mg | ORAL_TABLET | Freq: Every day | ORAL | 0 refills | Status: DC
  Filled 2023-07-21: qty 30, 30d supply, fill #0

## 2023-07-21 MED ORDER — NYSTATIN 100000 UNIT/ML MT SUSP
5.0000 mL | Freq: Four times a day (QID) | OROMUCOSAL | 0 refills | Status: AC
  Filled 2023-07-21: qty 60, 3d supply, fill #0

## 2023-07-21 MED ORDER — GUAIFENESIN ER 600 MG PO TB12
600.0000 mg | ORAL_TABLET | Freq: Two times a day (BID) | ORAL | 0 refills | Status: AC
  Filled 2023-07-21: qty 60, 30d supply, fill #0

## 2023-07-21 NOTE — Discharge Summary (Signed)
Physician Discharge Summary  [name] VZD:[id number] DOB: Apr 26, 1946 DOA: 07/06/2023  PCP: Jeoffrey Massed, MD  Admit date: 07/06/2023 Discharge date: 07/21/2023  Admitted From: Home Discharge disposition: Home with home health  Recommendations at discharge:  Recommend compliance with inhalers Follow-up with pulmonology as an outpatient.   Brief narrative: Linda [name] is a 77 y.o. female with PMH significant for pulmonary asbestosis, pleural plaques moderate persistent asthma, chronic hypoxemic respiratory failure on 3 L home oxygen, vocal cord dysfunction, OSA, chronic HFpEF, history of PE, chronic pain, anxiety/depression, nonobstructive CAD, hypertension, idiopathic angioedema-urticaria, neuroleptic induced parkinsonism who lives in an independent living facility. 7/14, patient presented with complaint of several weeks of cough, shortness of breath treated with antibiotics, steroids and inhalers as an outpatient but continued to have symptoms. She was noted to be in severe respiratory distress requiring nonrebreather mask CT chest showed known heavy bilateral pleural calcifications due to asbestosis.  Admitted to [facility]  Pulmonary consulted. Patient was started on optimal management with IV steroids, IV antibiotics bronchodilators, antitussives. Her hospital course got prolonged due to persistent symptoms. 7/18, esophagram without strictures, could not evaluate for reflux. ENT was consulted as well. 7/19, ENT did fiberoptic exam of the pharynx and larynx which were unremarkable for any abnormal findings or mass.  Vocal cords were normally mobile. 7/20, CT maxillofacial was obtained which showed paranasal sinus disease with a small air-fluid level in the right sphenoid sinus consistent with acute sinusitis  With the recommendations from pulmonology, patient was treated with extensive regimen to help her breathing.  Her hospitalization got prolonged because of patient's  reluctance to be discharged citing the risk of decompensation again.  She was evaluated for home oxygen qualification.  She did not qualify.  Her lowest oxygen level on ambulation was 89% on room air.  She however wanted to continue using oxygen stating that her lung doctors in the past had recommended her to do so.  She is upset that she might have to be out-of-pocket for oxygen post discharge.  Subjective: Patient was seen and examined this morning.  Not in distress.  Prefers to keep oxygen on.  Hospital course: Acute on chronic hypoxic respiratory failure Acute exacerbation of moderate persistent asthma Acute sinusitis Based on patient's progression of symptoms, imagings normal direct laryngoscopy findings and response to treatment, the hypothesis at this time is that patient probably had asthma exacerbation triggered by sinusitis  She completed 10-day course of IV Rocephin. Per pulmonology recommendation, she was treated with bronchodilators, antihistamine, Singulair, Flonase, azelastine, sinus saline rinses, Tessalon Perles Discharge on Breztri, albuterol inhaler, Singulair, Flonase Currently on a tapering course of prednisone.  3 more days of prednisone 10 mg daily. Encourage the use of incentive spirometry, flutter valve  She was evaluated for home oxygen qualification.  She did not qualify.  Her lowest oxygen level on ambulation was 89% on room air.  She however wanted to continue using oxygen stating that her lung doctors in the past had recommended her to do so.  She is upset that she might have to be out-of-pocket for oxygen post discharge.  Chronic pulmonary asbestosis Chronic pleural plaques 2 follow-up with pulmonary as an outpatient  Agitation Chronic pain with possible opiate dependence Anxiety/depression  Continue current regimen with Cymbalta, as needed Xanax Outpatient follow-up with PCP/pain management   Chronic diastolic CHF Hypertension CAD/HLD Most recent echo  from February 2023 with EF 70 to 75%, grade 1 diastolic dysfunction No anginal symptoms currently.   Continue  Toprol, Imdur, amlodipine, aspirin and statin She has also been started on Lasix 20 mg daily which hopefully will improve her breathing.  OSA  Not on CPAP at home.  Will need outpatient sleep study   Hypokalemia Potassium level improved with replacement. Recent Labs  Lab 07/16/23 0411  K 3.6  MG 2.5*   Physical deconditioning PT eval obtained.   HHPT recommended.  Osteoarthritis She stated that she was supposed to be on fentanyl patch as suggested by her orthopedic in the past.  Goals of care   Code Status: DNR   Wounds:  -    Discharge Exam:   Vitals:   07/20/23 2010 07/20/23 2054 07/21/23 0420 07/21/23 0850  BP:  (!) 125/56 (!) 119/59   Pulse:  73 73   Resp:  18 18   Temp:  98.3 F (36.8 C) 98 F (36.7 C)   TempSrc:  Oral Oral   SpO2: 97% 96% 96% 95%  Weight:      Height:        Body mass index is 25.54 kg/m.   General exam: Pleasant elderly Caucasian female. Skin: No rashes, lesions or ulcers. HEENT: Atraumatic, normocephalic, no obvious bleeding Lungs: No wheezing, no cough on deep breathing today CVS: Regular rate and rhythm, no murmur GI/Abd soft, nontender, nondistended, bowel sound present CNS: Alert, awake, oriented x 3 Psychiatry: Anxious Extremities: No pedal edema, no calf tenderness  Follow ups:    Follow-up Information     [facility] Follow up.   Specialty: Physical Therapy Why: Home Health Physical Therapy Contact information: [address] [place] [address] [phone]         Jeoffrey Massed, MD Follow up.   Specialty: Family Medicine Contact information: [address] [place] [address] [phone]                 Discharge Instructions:   Discharge Instructions     Call MD for:  difficulty breathing, headache or visual disturbances   Complete by: As  directed    Call MD for:  extreme fatigue   Complete by: As directed    Call MD for:  hives   Complete by: As directed    Call MD for:  persistant dizziness or light-headedness   Complete by: As directed    Call MD for:  persistant nausea and vomiting   Complete by: As directed    Call MD for:  severe uncontrolled pain   Complete by: As directed    Call MD for:  temperature >100.4   Complete by: As directed    Diet general   Complete by: As directed    Discharge instructions   Complete by: As directed    Recommendations at discharge:   Recommend compliance with inhalers  Follow-up with pulmonology as an outpatient.  General discharge instructions: Follow with Primary MD McGowen, Maryjean Morn, MD in 7 days  Please request your PCP  to go over your hospital tests, procedures, radiology results at the follow up. Please get your medicines reviewed and adjusted.  Your PCP may decide to repeat certain labs or tests as needed. Do not drive, operate heavy machinery, perform activities at heights, swimming or participation in water activities or provide baby sitting services if your were admitted for syncope or siezures until you have seen by Primary MD or a Neurologist and advised to do so again. [place] Controlled Substance Reporting System database was reviewed. Do not drive,  operate heavy machinery, perform activities at heights, swim, participate in water activities or provide baby-sitting services while on medications for pain, sleep and mood until your outpatient physician has reevaluated you and advised to do so again.  You are strongly recommended to comply with the dose, frequency and duration of prescribed medications. Activity: As tolerated with Full fall precautions use walker/cane & assistance as needed Avoid using any recreational substances like cigarette, tobacco, alcohol, or non-prescribed drug. If you experience worsening of your admission symptoms, develop shortness of  breath, life threatening emergency, suicidal or homicidal thoughts you must seek medical attention immediately by calling 911 or calling your MD immediately  if symptoms less severe. You must read complete instructions/literature along with all the possible adverse reactions/side effects for all the medicines you take and that have been prescribed to you. Take any new medicine only after you have completely understood and accepted all the possible adverse reactions/side effects.  Wear Seat belts while driving. You were cared for by a hospitalist during your hospital stay. If you have any questions about your discharge medications or the care you received while you were in the hospital after you are discharged, you can call the unit and ask to speak with the hospitalist or the covering physician. Once you are discharged, your primary care physician will handle any further medical issues. Please note that NO REFILLS for any discharge medications will be authorized once you are discharged, as it is imperative that you return to your primary care physician (or establish a relationship with a primary care physician if you do not have one).   Increase activity slowly   Complete by: As directed        Discharge Medications:   Allergies as of 07/21/2023       Reactions   Aripiprazole Rash, Other (See Comments)   Parkinsonism, also   Penicillins Shortness Of Breath, Itching, Swelling, Rash, Other (See Comments)   Tolerated Cefepime in ED   Bactrim [sulfamethoxazole-trimethoprim] Rash        Medication List     STOP taking these medications    buprenorphine 5 MCG/HR Ptwk Commonly known as: BUTRANS   cyclobenzaprine 5 MG tablet Commonly known as: FLEXERIL   ipratropium-albuterol 0.5-2.5 (3) MG/3ML Soln Commonly known as: DUONEB   metoprolol tartrate 50 MG tablet Commonly known as: Lopressor       TAKE these medications    albuterol 108 (90 Base) MCG/ACT inhaler Commonly known as:  VENTOLIN HFA Inhale 1-2 puffs into the lungs every 6 (six) hours as needed for wheezing or shortness of breath. What changed:  how much to take when to take this   ALPRAZolam 0.5 MG tablet Commonly known as: XANAX TAKE ONE TABLET BY MOUTH THREE TIMES DAILY   amLODipine 5 MG tablet Commonly known as: NORVASC TAKE ONE TABLET BY MOUTH DAILY   aspirin 81 MG tablet Take 81 mg by mouth at bedtime.   benzonatate 100 MG capsule Commonly known as: TESSALON Take 100 mg by mouth 2 (two) times daily as needed for cough.   Breztri Aerosphere 160-9-4.8 MCG/ACT Aero Generic drug: Budeson-Glycopyrrol-Formoterol Inhale 2 puffs into the lungs 2 (two) times daily.   diclofenac Sodium 1 % Gel Commonly known as: VOLTAREN Apply 2 g topically 4 (four) times daily. 4 times a day to both knees What changed:  when to take this reasons to take this additional instructions   DULoxetine 60 MG capsule Commonly known as: CYMBALTA Take 1 capsule (60 mg total)  by mouth daily. What changed: when to take this   EPINEPHrine 0.3 mg/0.3 mL Soaj injection Commonly known as: EPI-PEN Inject 0.3 mg into the muscle as needed for anaphylaxis.   fluticasone 50 MCG/ACT nasal spray Commonly known as: FLONASE Place 1 spray into both nostrils at bedtime.   furosemide 20 MG tablet Commonly known as: LASIX Take 1 tablet (20 mg total) by mouth daily. Start taking on: July 22, 2023   gabapentin 100 MG capsule Commonly known as: NEURONTIN Take 1 capsule (100 mg total) by mouth 2 (two) times daily.   guaiFENesin 600 MG 12 hr tablet Commonly known as: MUCINEX Take 1 tablet (600 mg total) by mouth 2 (two) times daily.   ipratropium 0.03 % nasal spray Commonly known as: ATROVENT Place 2 sprays into both nostrils every 12 (twelve) hours as needed (allergies).   isosorbide mononitrate 30 MG 24 hr tablet Commonly known as: IMDUR Take 2 tablets (60 mg total) by mouth in the morning and at bedtime.   lamoTRIgine  200 MG tablet Commonly known as: LAMICTAL Take 1 tablet (200 mg total) by mouth daily. What changed: when to take this   loratadine 10 MG tablet Commonly known as: CLARITIN Take 1 tablet (10 mg total) by mouth daily as needed for allergies or rhinitis.   methocarbamol 500 MG tablet Commonly known as: ROBAXIN Take 1 tablet (500 mg total) by mouth every 8 (eight) hours as needed for muscle spasms.   metoprolol succinate 100 MG 24 hr tablet Commonly known as: TOPROL-XL take ONE tab daily, take additional 1/2 tab as needed FOR FOR BLOOD PRESSURE > 160/90 What changed:  how much to take how to take this when to take this additional instructions   montelukast 10 MG tablet Commonly known as: SINGULAIR Take 1 tablet (10 mg total) by mouth at bedtime.   multivitamin with minerals Tabs tablet Take 1 tablet by mouth daily with breakfast.   nitroGLYCERIN 0.4 MG SL tablet Commonly known as: NITROSTAT Place 1 tablet (0.4 mg total) under the tongue every 5 (five) minutes as needed for chest pain (x 3 doses).   nystatin 100000 UNIT/ML suspension Commonly known as: MYCOSTATIN Take 5 mLs (500,000 Units total) by mouth 4 (four) times daily for 7 days.   oxyCODONE-acetaminophen 10-325 MG tablet Commonly known as: Percocet Take 1 tablet by mouth every 6 (six) hours as needed for pain.   OXYGEN Inhale 3 L/min into the lungs continuous.   pantoprazole 40 MG tablet Commonly known as: PROTONIX Take 1 tablet (40 mg total) by mouth 2 (two) times daily.   predniSONE 10 MG tablet Commonly known as: DELTASONE Take 1 tablet (10 mg total) by mouth daily with breakfast for 3 days. Start taking on: July 22, 2023   rOPINIRole 0.5 MG tablet Commonly known as: REQUIP TAKE ONE TABLET BY MOUTH AT BEDTIME What changed:  how much to take how to take this when to take this additional instructions   rosuvastatin 40 MG tablet Commonly known as: CRESTOR Take 1 tablet (40 mg total) by mouth every  evening.   senna-docusate 8.6-50 MG tablet Commonly known as: Senokot-S Take 1 tablet by mouth 2 (two) times daily.   solifenacin 10 MG tablet Commonly known as: VESICARE TAKE ONE TABLET BY MOUTH ONCE DAILY **NEED OFFICE VISIT** FOR FURTHER REFILLS What changed: See the new instructions.   traZODone 50 MG tablet Commonly known as: DESYREL take 2-4 tablets at bedtime as needed for insomnia. What changed:  how much  to take how to take this when to take this reasons to take this additional instructions               Durable Medical Equipment  (From admission, onward)           Start     Ordered   07/21/23 0938  For home use only DME oxygen  Once       Question Answer Comment  Length of Need Lifetime   Mode or (Route) Nasal cannula   Liters per Minute 3   Frequency Continuous (stationary and portable oxygen unit needed)   Oxygen conserving device Yes   Oxygen delivery system Gas      07/21/23 0938   07/17/23 1456  For home use only DME oxygen  Once       Question Answer Comment  Length of Need Lifetime   Mode or (Route) Nasal cannula   Frequency Continuous (stationary and portable oxygen unit needed)   Oxygen conserving device Yes   Oxygen delivery system Gas      07/17/23 1455             The results of significant diagnostics from this hospitalization (including imaging, microbiology, ancillary and laboratory) are listed below for reference.    Procedures and Diagnostic Studies:   CT Chest W Contrast  Result Date: 08-02-23 CLINICAL DATA:  Dyspnea and shortness of breath. EXAM: CT CHEST WITH CONTRAST TECHNIQUE: Multidetector CT imaging of the chest was performed during intravenous contrast administration. RADIATION DOSE REDUCTION: This exam was performed according to the departmental dose-optimization program which includes automated exposure control, adjustment of the mA and/or kV according to patient size and/or use of iterative reconstruction  technique. CONTRAST:  75mL OMNIPAQUE IOHEXOL 300 MG/ML  SOLN COMPARISON:  Chest x-ray August 02, 2023 FINDINGS: Cardiovascular: Enlarged cardiac silhouette. Calcific atherosclerotic disease of the coronary arteries and aorta. Minimal pericardial thickening. Mediastinum/Nodes: Borderline enlarged mediastinal and hilar lymph nodes. Index precarinal lymph node measures 1 cm in short axis. 8 mm hypoattenuated lesion within the right thyroid gland. Patent main airways. Lungs/Pleura: Heavy bilateral pleural calcifications with associated linear nodular pleural and subpleural thickening. Most prominent nodular thickening is seen in the left lower lobe. Upper Abdomen: No acute abnormality. Musculoskeletal: Compression deformity of T7, T9 and T12 vertebral bodies, stable when compared to 2022/03/25, CT of the thoracic spine. IMPRESSION: 1. Heavy bilateral pleural calcifications with associated linear and nodular pleural and subpleural thickening. Most prominent nodular thickening is seen in the left lower lobe. Findings are consistent with asbestos related pleural disease or posttraumatic changes. Malignancy is not excluded. Further evaluation with PET-CT may be considered when clinically appropriate. 2. Borderline enlarged mediastinal and hilar lymph nodes, stable. 3. Enlarged cardiac silhouette. 4. Calcific atherosclerotic disease of the coronary arteries and aorta. 5. 8 mm hypoattenuated lesion within the right thyroid gland. Not clinically significant; no follow-up imaging recommended (ref: J Am Coll Radiol. 2015 Feb;12(2): 143-50). 6. Compression deformity of T7, T9 and T12 vertebral bodies, stable when compared to 08-02-2023, CT of the thoracic spine. 7. Aortic atherosclerosis. Aortic Atherosclerosis (ICD10-I70.0). Electronically Signed   By: Ted Mcalpine M.D.   On: 2023-08-02 17:01   DG Chest Portable 1 View  Result Date: 08/02/2023 CLINICAL DATA:  Dyspnea and shortness of breath EXAM: PORTABLE CHEST 1  VIEW COMPARISON:  06/02/2023 FINDINGS: Stable mild enlargement of the cardiopericardial silhouette. Atherosclerotic calcification of the aortic arch. Stable bilateral pleural calcifications. Against this background of densities,  no new or progressive opacities are identified in the lungs. There is some mild scarring of the left lung base laterally. Stable mild fullness of the hila. IMPRESSION: 1. No acute findings. 2. Stable mild enlargement of the cardiopericardial silhouette. 3. Stable extensive bilateral pleural calcifications. 4. Stable fullness of the hila, adenopathy not excluded and was shown on prior CT of 04/02/2023. 5.  Aortic Atherosclerosis (ICD10-I70.0). Electronically Signed   By: Gaylyn Rong M.D.   On: 07/06/2023 14:14     Labs:   Basic Metabolic Panel: Recent Labs  Lab 07/16/23 0411  NA 135  K 3.6  CL 98  CO2 28  GLUCOSE 172*  BUN 28*  CREATININE 0.86  CALCIUM 8.8*  MG 2.5*   GFR Estimated Creatinine Clearance: 51.7 mL/min (by C-G formula based on SCr of 0.86 mg/dL). Liver Function Tests: Recent Labs  Lab 07/16/23 0411  AST 22  ALT 29  ALKPHOS 58  BILITOT 0.5  PROT 7.0  ALBUMIN 3.5   No results for input(s): "LIPASE", "AMYLASE" in the last 168 hours. No results for input(s): "AMMONIA" in the last 168 hours. Coagulation profile No results for input(s): "INR", "PROTIME" in the last 168 hours.  CBC: Recent Labs  Lab 07/15/23 0304 07/16/23 0411  WBC 13.4* 13.4*  HGB 12.8 13.1  HCT 40.7 41.4  MCV 89.8 89.4  PLT 236 236   Cardiac Enzymes: No results for input(s): "CKTOTAL", "CKMB", "CKMBINDEX", "TROPONINI" in the last 168 hours. BNP: Invalid input(s): "POCBNP" CBG: Recent Labs  Lab 07/15/23 0742  GLUCAP 97   D-Dimer No results for input(s): "DDIMER" in the last 72 hours. Hgb A1c No results for input(s): "HGBA1C" in the last 72 hours. Lipid Profile No results for input(s): "CHOL", "HDL", "LDLCALC", "TRIG", "CHOLHDL", "LDLDIRECT" in the  last 72 hours. Thyroid function studies No results for input(s): "TSH", "T4TOTAL", "T3FREE", "THYROIDAB" in the last 72 hours.  Invalid input(s): "FREET3" Anemia work up No results for input(s): "VITAMINB12", "FOLATE", "FERRITIN", "TIBC", "IRON", "RETICCTPCT" in the last 72 hours. Microbiology No results found for this or any previous visit (from the past 240 hour(s)).  Time coordinating discharge: 45 minutes  Signed: Shevaun Lovan  [facility] 07/21/2023, 11:19 AM

## 2023-07-21 NOTE — TOC Benefit Eligibility Note (Signed)
Pharmacy Patient Advocate Encounter  Insurance verification completed.    The patient is insured through  [employer] test claim for [place] and the current 30 day co-pay is $46.00.   This test claim was processed through [facility]- copay amounts may vary at other pharmacies due to pharmacy/plan contracts, or as the patient moves through the different stages of their insurance plan.

## 2023-07-21 NOTE — TOC Initial Note (Signed)
Transition of Care [facility]) - Initial/Assessment Note    Patient Details  Name: Linda [name] MRN: [id number] Date of Birth: 1946-11-28  Transition of Care [name]) CM/SW Contact:    Howell Rucks, RN Phone Number: 07/21/2023, 11:18 AM  Clinical Narrative: NCM outreaced to [employer] rep-Zach, reports home 02 concentrator is scheduled for service on 07/22/23. NCM spoke with [facility] supervisor who contacted Fayrene Fearing at [employer]. Patient will be discharged from hospital with portable 02 concentrator, [employer] will make home concentrator service call today. NCM spoke with pt at bedside, pt agreeable to concentrator. NCM informed pt service call from [employer] scheduled today and to please answer her phone when they call, pt voiced understanding. Patient reports he son was to provide transport but he is will, will arrange PTAR for transportation. No further TOC needs identified.                    Expected Discharge Plan: Home w Home Health Services Barriers to Discharge: Barriers Resolved   Patient Goals and CMS Choice Patient states their goals for this hospitalization and ongoing recovery are:: return to her ILF CMS Medicare.gov Compare Post Acute Care list provided to:: Patient Choice offered to / list presented to : Patient      Expected Discharge Plan and Services       Living arrangements for the past 2 months: Independent Living Facility                   DME Agency: [employer] Date DME Agency Contacted: 07/21/23 Time DME Agency Contacted: 1116 Representative spoke with at DME Agency: Ian Malkin HH Arranged: PT HH Agency:  [facility]) Date HH Agency Contacted: 07/21/23 Time HH Agency Contacted: 1118    Prior Living Arrangements/Services Living arrangements for the past 2 months: Independent Living Facility Lives with:: Facility Resident Patient language and need for interpreter reviewed:: No Do you feel safe going back to the place where you live?: Yes                Activities of Daily Living Home Assistive Devices/Equipment: Environmental consultant (specify type) (PRN) ADL Screening (condition at time of admission) Patient's cognitive ability adequate to safely complete daily activities?: No Is the patient deaf or have difficulty hearing?: No Does the patient have difficulty seeing, even when wearing glasses/contacts?: No Does the patient have difficulty concentrating, remembering, or making decisions?: No Patient able to express need for assistance with ADLs?: Yes Does the patient have difficulty dressing or bathing?: No Independently performs ADLs?: Yes (appropriate for developmental age) Does the patient have difficulty walking or climbing stairs?: Yes Weakness of Legs: Both Weakness of Arms/Hands: None  Permission Sought/Granted                  Emotional Assessment Appearance:: Appears stated age     Orientation: : Oriented to Self, Oriented to Place, Oriented to  Time, Oriented to Situation      Admission diagnosis:  Pulmonary asbestosis (HCC) [J61] Hypoxia [R09.02] Acute on chronic respiratory failure (HCC) [J96.20] Patient Active Problem List   Diagnosis Date Noted   Acute non-recurrent sphenoidal sinusitis 07/14/2023   Acute bacterial sinusitis 07/13/2023   Moderate persistent asthma with acute exacerbation 07/13/2023   Acute respiratory failure with hypoxia (HCC) 07/11/2023   Subacute cough 07/10/2023   Acute on chronic respiratory failure (HCC) 07/06/2023   Vocal cord dysfunction 07/06/2023   Thoracic vertebral fracture (HCC) 02/26/2022   Fall 02/16/2022   Maxillary fracture (  HCC) 02/16/2022   Right orbital fracture (HCC) 02/16/2022   Right ureteral stone 11/30/2021   Chronic pain of left knee 10/18/2021   Spondylosis of lumbar spine 08/23/2021   Acute encephalopathy 11/24/2020   Dysphagia 06/15/2020   Allergic reaction 02/01/2020   Idiopathic angioedema 01/20/2020   Dysarthria 01/20/2020   Hypotension 10/04/2019    COPD (chronic obstructive pulmonary disease) (HCC) 01/21/2018   Pulmonary asbestosis (HCC) 01/21/2018   Hypomagnesemia 01/02/2018   Failure to thrive in adult 01/01/2018   Altered mental status 10/21/2017   Lumbar radiculitis 02/10/2017   Low serum erythropoietin level 10/17/2016   RBBB 09/23/2016   Primary osteoarthritis of left knee 06/19/2016   Debilitated patient 06/06/2016   CHF (congestive heart failure) (HCC) 05/30/2016   Chronic respiratory failure with hypoxia (HCC) 02/07/2016   Restrictive lung disease 02/07/2016   Rotator cuff syndrome of right shoulder 01/10/2016   Encephalopathy, metabolic 01/01/2016   Fall at home 01/01/2016   Rhabdomyolysis 01/01/2016   Diastolic heart failure (HCC) 10/16/2015   CAD-minor 2014 08/16/2015   Chest pain with moderate risk for cardiac etiology 08/16/2015   Narrowing of intervertebral disc space 07/17/2015   Bilateral lower leg pain 01/24/2015   Damage to right ulnar nerve 01/16/2015   Chronic pain syndrome 11/21/2014   Anxiety 11/21/2014   Hypokalemia 11/21/2014   Hyperlipidemia 11/21/2014   Chronic pain disorder 11/21/2014   Primary osteoarthritis of right knee 10/18/2014   Pernicious anemia 08/24/2014   Generalized anxiety disorder--with occasional panic attacks.  08/05/2014   Recurrent major depression-severe (HCC) 08/05/2014   Multifactorial gait disorder 07/26/2014   Epistaxis 07/18/2014   Acute GI bleeding 07/17/2014   Anemia associated with acute blood loss 07/17/2014   Syncope 07/17/2014   History of pulmonary embolism 07/17/2014   GI bleed 07/17/2014   Arthritis 05/11/2014   DDD (degenerative disc disease) 05/11/2014   Fibrositis 05/11/2014   Amianthosis (HCC) 05/11/2014   [name] (HCC) 05/11/2014   Grief 04/28/2014   OSA (obstructive sleep apnea) 04/24/2014   Chronic heart failure with preserved ejection fraction (HFpEF) (HCC) 04/24/2014   Atelectasis 08/06/2013   Acute pulmonary embolism (HCC) 08/04/2013    Pleural plaque with presence of asbestos 07/22/2013   Polypharmacy 04/26/2013   Chronic pain 03/01/2013   Insomnia 11/12/2012   HTN (hypertension), benign 10/25/2012   PCP:  Jeoffrey Massed, MD Pharmacy:   [facility] [phone] - [place] Otto, [place] - [address] AT [facility] [address] [place] [address] Phone: [phone] Fax: [phone]  [facility] [place], [place] - [address] [facility] [address] [place] [address] Phone: [phone] Fax: [phone]  [facility] [address] [place] [address] Phone: [phone] Fax: [phone]     Social Determinants of Health (SDOH) Social History: SDOH Screenings   Food Insecurity: No Food Insecurity (07/07/2023)  Housing: Low Risk  (07/07/2023)  Transportation Needs: No Transportation Needs (07/07/2023)  Utilities: Not At Risk (07/07/2023)  Alcohol Screen: Low Risk  (01/01/2023)  Depression (PHQ2-9): Low Risk  (07/02/2023)  Recent Concern: Depression (PHQ2-9) - High Risk (05/01/2023)  Financial Resource Strain: Low Risk  (01/01/2023)  Physical Activity: Insufficiently Active (01/01/2023)  Social Connections: Moderately Isolated (01/01/2023)  Stress: No Stress Concern Present (01/01/2023)  Tobacco Use: Low Risk  (07/06/2023)  Recent Concern: Tobacco Use - Medium Risk (06/03/2023)   [facility]   SDOH Interventions:  Readmission Risk Interventions    07/16/2023    9:36 AM  Readmission Risk Prevention Plan  Transportation Screening Complete  Medication Review (RN Care Manager) Complete  PCP or Specialist appointment within 3-5 days of discharge Complete  HRI or Home Care Consult Complete  SW Recovery Care/Counseling Consult Complete  Palliative Care Screening Not Applicable  Skilled Nursing Facility Not Applicable

## 2023-07-21 NOTE — Progress Notes (Signed)
NAME:  Linda [name], MRN:  [id number], DOB:  [birth year], LOS: 15 ADMISSION DATE:  07/06/2023, CONSULTATION DATE: 07/06/2023 REFERRING MD: Dr. Loney Loh, CHIEF COMPLAINT: Respiratory distress  History of Present Illness:  77 yo female with hx of asbestos exposure and pleural plaques, chronic respiratory failure with hypoxia, and vocal cord dysfunction started having trouble with her breathing about 6 weeks ago. She has cough and throat irritation. Gets a rattling in her throat. Gets anxious when she has trouble with her breathing. Nebulizer treatments helps a little.   Pertinent  Medical History  Upper GI bleeding, Anxiety, Vertigo, CAD, HFpEF, Back pain, [name], Diverticulosis, Fibromyalgia, Hiatal hernia, Pneumonia, HTN, Idiopathic urticaria, Insomnia, IDA, Migraine headache, Nephrolithiasis, OSA, Pernicious anemia, PE 2014, Pyelonephritis, RBBB, Depression  Studies:  CT chest 07/06/23 >> heavy bilateral pleural calcifications with associated nodular pleural and subpleural thickening most prominent in Lt lower lobe  Interim History / Subjective:  Discussed extensively her history of wearing oxygen and concern that she has been told by doctors in the past that she needs oxygen so wears 3L at baseline. On her ambulatory O2 on 07/17/23 and With PT walk on 7/28 with no desaturations. She is upset that the may have to pay out of pocket for a new inogen.  Objective   Blood pressure (!) 119/59, pulse 73, temperature 98 F (36.7 C), temperature source Oral, resp. rate 18, height 5\' 4"  (1.626 m), weight 67.5 kg, SpO2 96%.        Intake/Output Summary (Last 24 hours) at 07/21/2023 0855 Last data filed at 07/21/2023 0400 Gross per 24 hour  Intake 1480 ml  Output --  Net 1480 ml   Filed Weights   07/06/23 1500 07/06/23 2145  Weight: 67.6 kg 67.5 kg   Physical Exam: General: Well-appearing, no acute distress HENT: [place], AT Eyes: EOMI, no scleral icterus Respiratory: Upper airway wheeze. Lungs  clear to auscultation bilaterally.  No crackles, wheezing or rales Cardiovascular: RRR, -M/R/G, no JVD Extremities:-Edema,-tenderness Neuro: AAO x4, CNII-XII grossly intact Psych: Normal mood, normal affect  CXR 07/18/23 - No acute infiltrate effusion or edema  Assessment & Plan:   Acute respiratory distress due to asthmatic exacerbation triggered by sinusitis and further complicated by  subacute cough, throat irritation and respiratory distress due to VCD vs Laryngopharyngeal reflux disease (LPR)  Acute sinusitis She was seen by ENT who evaluated by DL and noted no vocal cord paralysis, good movement, no physical abnormalities. Improving but continues to have sensation of upper airway abnormality when coughing and then feels short of breath. May be triggered by sinusitis 7/25 No desaturations on ambulatory O2. Resumed home 3L for comfort 7/27 Clinically improved. On baseline home O2 3L. CXR with no acute abnormalities 7/28 No desaturations with PT. Nadir SpO2 89%. Median 90-91% Unfortunately patient does qualify for O2 based on this admission. Patient requesting to be re-evaluated as an outpatient by another Pulmonary doctor.  Plan -Complete 7-10 day course of Ceftriaxone -Discontinue nebulizers -Start Dulera 200-5 mcg TWO puffs in the morning and evening. Will need to follow with Pulmonary for inhalers covered by insurance -Prednisone taper -Continue saline nasal, flonase and azelastine nasal therapy -Cont gabapentin for cough suppression as well tessalon PRN -Try to limit talking and vocal rest -Cont Singulair and Claritin -Protonix bid and pepcid at bedtime -Reflux precautions -Cont BD therapy  -PT as tolerated -OOB, flutter and IS  History of obstructive sleep apnea. Plan -Reassess as out pt  Asbestos related pleural plaques. - has more prominent  pleural thickening Plan -will need PET scan as an outpt to assess further and then determine if she needs pleural  biopsy  Pulmonary available as needed.  Will place telephone note to arrange for pulmonary outpatient follow-up.  >Addendum: [facility] Will follow-up with her primary pulmonologist  >Addendum 07/21/23: Patient now wants to establish care/follow-up with [facility] and requesting Dr. Judeth Horn that she was initially scheduled with. Told her I would do my best but will likely need to be seen by next available physician for hospital follow-up.  Appointment made with NP Cobb on 08/20/23 at 2:30 PM.  Care Time: 50 min  Mechele Collin, M.D. [facility] Pulmonary/Critical Care Medicine 07/21/2023 8:55 AM   See Amion for personal pager For hours between 7 PM to 7 AM, please call Elink for urgent questions

## 2023-07-21 NOTE — TOC Transition Note (Signed)
Transition of Care [facility]) - CM/SW Discharge Note   Patient Details  Name: Linda [name] MRN: [id number] Date of Birth: 1946/12/10  [facility]) CM/SW Contact:  Howell Rucks, RN Phone Number: 07/21/2023, 1:46 PM   Clinical Narrative:  DC to home. PTAR agreeable to take 1 small 02 concentrator. [employer] to make service call for home 02 concentrator. No further TOC needs identified.     Final next level of care: Home w Home Health Services ([name]) Barriers to Discharge: Barriers Resolved   Patient Goals and CMS Choice CMS Medicare.gov Compare Post Acute Care list provided to:: Patient Choice offered to / list presented to : Patient  Discharge Placement                      Patient and family notified of of transfer: 07/21/23  Discharge Plan and Services Additional resources added to the After Visit Summary for                    DME Agency: [employer] Date DME Agency Contacted: 07/21/23 Time DME Agency Contacted: 1116 Representative spoke with at DME Agency: Ian Malkin HH Arranged: PT HH Agency:  [facility] Date HH Agency Contacted: 07/21/23 Time HH Agency Contacted: 1118    Social Determinants of Health (SDOH) Interventions SDOH Screenings   Food Insecurity: No Food Insecurity (07/07/2023)  Housing: Low Risk  (07/07/2023)  Transportation Needs: No Transportation Needs (07/07/2023)  Utilities: Not At Risk (07/07/2023)  Alcohol Screen: Low Risk  (01/01/2023)  Depression (PHQ2-9): Low Risk  (07/02/2023)  Recent Concern: Depression (PHQ2-9) - High Risk (05/01/2023)  Financial Resource Strain: Low Risk  (01/01/2023)  Physical Activity: Insufficiently Active (01/01/2023)  Social Connections: Moderately Isolated (01/01/2023)  Stress: No Stress Concern Present (01/01/2023)  Tobacco Use: Low Risk  (07/06/2023)  Recent Concern: Tobacco Use - Medium Risk (06/03/2023)   [facility]      Readmission Risk Interventions    07/16/2023    9:36 AM  Readmission Risk Prevention Plan  Transportation Screening Complete  Medication Review (RN Care Manager) Complete  PCP or Specialist appointment within 3-5 days of discharge Complete  HRI or Home Care Consult Complete  SW Recovery Care/Counseling Consult Complete  Palliative Care Screening Not Applicable  Skilled Nursing Facility Not Applicable

## 2023-07-21 NOTE — Progress Notes (Signed)
Mobility Specialist - Progress Note  Pre-mobility: 79 bpm HR, 93% SpO2 During mobility: 91 bpm HR, 92% SpO2 Post-mobility: 78 bpm HR, 97% SPO2   07/21/23 1126  Oxygen Therapy  O2 Device Nasal Cannula  O2 Flow Rate (L/min) 3 L/min  Patient Activity (if Appropriate) Ambulating  Mobility  Activity Ambulated with assistance in hallway  Level of Assistance Standby assist, set-up cues, supervision of patient - no hands on  Assistive Device Front wheel walker  Distance Ambulated (ft) 350 ft  Range of Motion/Exercises Active  Activity Response Tolerated well  Mobility Referral Yes  $Mobility charge 1 Mobility  Mobility Specialist Start Time (ACUTE ONLY) 1110  Mobility Specialist Stop Time (ACUTE ONLY) 1126  Mobility Specialist Time Calculation (min) (ACUTE ONLY) 16 min   Pt was found in bed and agreeable to ambulate. No complaints with session and at EOS returned to bed with all needs met. Call bell in reach.  Billey Chang Mobility Specialist

## 2023-07-21 NOTE — Care Management Important Message (Signed)
Important Message  Patient Details IM Letter mailed due to Patient discharged before letter could be given. Name: Linda [name] MRN: [id number] Date of Birth: January 04, 1946   Medicare Important Message Given:  Other (see comment)     [name] 07/21/2023, 3:26 PM

## 2023-07-22 ENCOUNTER — Other Ambulatory Visit (HOSPITAL_COMMUNITY): Payer: Self-pay

## 2023-07-22 ENCOUNTER — Telehealth: Payer: Self-pay

## 2023-07-22 NOTE — Transitions of Care (Post Inpatient/ED Visit) (Signed)
   07/22/2023  Name: Linda [name] MRN: [id number] DOB: 1946/03/23  Today's TOC FU Call Status: Today's TOC FU Call Status:: Unsuccessul Call (1st Attempt) Unsuccessful Call (1st Attempt) Date: 07/22/23  Attempted to reach the patient regarding the most recent Inpatient/ED visit.  Follow Up Plan: Additional outreach attempts will be made to reach the patient to complete the Transitions of Care (Post Inpatient/ED visit) call.      Antionette Fairy, RN,BSN,CCM [facility] Care Management Care Management Community Coordinator Direct Phone: [phone] Toll Free: [phone] Fax: [phone]

## 2023-07-23 ENCOUNTER — Encounter (INDEPENDENT_AMBULATORY_CARE_PROVIDER_SITE_OTHER): Payer: Self-pay

## 2023-07-23 ENCOUNTER — Telehealth: Payer: Self-pay

## 2023-07-23 NOTE — Transitions of Care (Post Inpatient/ED Visit) (Signed)
   07/23/2023  Name: Linda [name] MRN: [id number] DOB: [birth year]  Today's TOC FU Call Status: Today's TOC FU Call Status:: Unsuccessful Call (2nd Attempt) Unsuccessful Call (2nd Attempt) Date: 07/23/23  Attempted to reach the patient regarding the most recent Inpatient/ED visit.  Follow Up Plan: Additional outreach attempts will be made to reach the patient to complete the Transitions of Care (Post Inpatient/ED visit) call.      Antionette Fairy, RN,BSN,CCM [facility] Care Management Care Management Community Coordinator Direct Phone: [phone] Toll Free: [phone] Fax: [phone]

## 2023-07-23 NOTE — Transitions of Care (Post Inpatient/ED Visit) (Signed)
   07/23/2023  Name: Linda [name] MRN: [id number] DOB: 1946/10/22  Today's TOC FU Call Status: Today's TOC FU Call Status:: Unsuccessful Call (3rd Attempt) Unsuccessful Call (3rd Attempt) Date: 07/23/23  Attempted to reach the patient regarding the most recent Inpatient/ED visit.  Follow Up Plan: No further outreach attempts will be made at this time. We have been unable to contact the patient.    Antionette Fairy, RN,BSN,CCM [facility] Care Management Care Management Community Coordinator Direct Phone: [phone] Toll Free: [phone] Fax: [phone]

## 2023-07-25 DIAGNOSIS — M6281 Muscle weakness (generalized): Secondary | ICD-10-CM | POA: Diagnosis not present

## 2023-07-25 DIAGNOSIS — R2689 Other abnormalities of gait and mobility: Secondary | ICD-10-CM | POA: Diagnosis not present

## 2023-07-28 ENCOUNTER — Telehealth: Payer: Self-pay

## 2023-07-28 DIAGNOSIS — R2689 Other abnormalities of gait and mobility: Secondary | ICD-10-CM | POA: Diagnosis not present

## 2023-07-28 DIAGNOSIS — M6281 Muscle weakness (generalized): Secondary | ICD-10-CM | POA: Diagnosis not present

## 2023-07-28 NOTE — Patient Outreach (Signed)
  Care Coordination   07/28/2023 Name: Linda [name] MRN: [id number] DOB: [birth year]   Care Coordination Outreach Attempts:  An unsuccessful telephone outreach was attempted today to offer the patient information about available care coordination services.  Follow Up Plan:  Additional outreach attempts will be made to offer the patient care coordination information and services.   Encounter Outcome:  No Answer   Care Coordination Interventions:  No, not indicated    Bary Leriche, RN, MSN [facility] Care Management Care Management Coordinator Direct Line [phone]

## 2023-07-29 ENCOUNTER — Ambulatory Visit: Payer: Medicare Other | Attending: Cardiology | Admitting: Cardiology

## 2023-07-29 ENCOUNTER — Encounter: Payer: Self-pay | Admitting: Cardiology

## 2023-07-29 VITALS — BP 124/78 | HR 74 | Ht 64.0 in | Wt 148.6 lb

## 2023-07-29 DIAGNOSIS — I6523 Occlusion and stenosis of bilateral carotid arteries: Secondary | ICD-10-CM | POA: Insufficient documentation

## 2023-07-29 DIAGNOSIS — I5032 Chronic diastolic (congestive) heart failure: Secondary | ICD-10-CM | POA: Diagnosis not present

## 2023-07-29 DIAGNOSIS — I10 Essential (primary) hypertension: Secondary | ICD-10-CM | POA: Diagnosis not present

## 2023-07-29 DIAGNOSIS — E785 Hyperlipidemia, unspecified: Secondary | ICD-10-CM | POA: Diagnosis not present

## 2023-07-29 DIAGNOSIS — I2510 Atherosclerotic heart disease of native coronary artery without angina pectoris: Secondary | ICD-10-CM | POA: Insufficient documentation

## 2023-07-29 NOTE — Patient Instructions (Signed)
    Follow-Up: At [facility], you and your health needs are our priority.  As part of our continuing mission to provide you with exceptional heart care, we have created designated Provider Care Teams.  These Care Teams include your primary Cardiologist (physician) and Advanced Practice Providers (APPs -  Physician Assistants and Nurse Practitioners) who all work together to provide you with the care you need, when you need it.  We recommend signing up for the patient portal called "MyChart".  Sign up information is provided on this After Visit Summary.  MyChart is used to connect with patients for Virtual Visits (Telemedicine).  Patients are able to view lab/test results, encounter notes, upcoming appointments, etc.  Non-urgent messages can be sent to your provider as well.   To learn more about what you can do with MyChart, go to [email or web address].    Your next appointment:   6 month(s)  Provider:   Brian Crenshaw, MD      

## 2023-07-30 DIAGNOSIS — R2689 Other abnormalities of gait and mobility: Secondary | ICD-10-CM | POA: Diagnosis not present

## 2023-07-30 DIAGNOSIS — M6281 Muscle weakness (generalized): Secondary | ICD-10-CM | POA: Diagnosis not present

## 2023-07-31 DIAGNOSIS — M6281 Muscle weakness (generalized): Secondary | ICD-10-CM | POA: Diagnosis not present

## 2023-08-01 ENCOUNTER — Inpatient Hospital Stay: Payer: Medicare Other | Admitting: Pulmonary Disease

## 2023-08-02 ENCOUNTER — Other Ambulatory Visit: Payer: Self-pay | Admitting: Family Medicine

## 2023-08-04 DIAGNOSIS — R2689 Other abnormalities of gait and mobility: Secondary | ICD-10-CM | POA: Diagnosis not present

## 2023-08-04 DIAGNOSIS — M6281 Muscle weakness (generalized): Secondary | ICD-10-CM | POA: Diagnosis not present

## 2023-08-04 NOTE — Telephone Encounter (Signed)
Pt has appt tomorrow

## 2023-08-05 ENCOUNTER — Ambulatory Visit (INDEPENDENT_AMBULATORY_CARE_PROVIDER_SITE_OTHER): Payer: Medicare Other | Admitting: Family Medicine

## 2023-08-05 ENCOUNTER — Encounter: Payer: Self-pay | Admitting: Family Medicine

## 2023-08-05 ENCOUNTER — Telehealth: Payer: Self-pay

## 2023-08-05 VITALS — BP 121/69 | HR 72 | Wt 155.0 lb

## 2023-08-05 DIAGNOSIS — F411 Generalized anxiety disorder: Secondary | ICD-10-CM

## 2023-08-05 DIAGNOSIS — J9611 Chronic respiratory failure with hypoxia: Secondary | ICD-10-CM | POA: Diagnosis not present

## 2023-08-05 DIAGNOSIS — J9601 Acute respiratory failure with hypoxia: Secondary | ICD-10-CM

## 2023-08-05 DIAGNOSIS — J61 Pneumoconiosis due to asbestos and other mineral fibers: Secondary | ICD-10-CM | POA: Diagnosis not present

## 2023-08-05 DIAGNOSIS — J4541 Moderate persistent asthma with (acute) exacerbation: Secondary | ICD-10-CM | POA: Diagnosis not present

## 2023-08-05 DIAGNOSIS — R490 Dysphonia: Secondary | ICD-10-CM | POA: Diagnosis not present

## 2023-08-05 DIAGNOSIS — F339 Major depressive disorder, recurrent, unspecified: Secondary | ICD-10-CM | POA: Diagnosis not present

## 2023-08-05 DIAGNOSIS — I5032 Chronic diastolic (congestive) heart failure: Secondary | ICD-10-CM

## 2023-08-05 DIAGNOSIS — R41841 Cognitive communication deficit: Secondary | ICD-10-CM | POA: Diagnosis not present

## 2023-08-05 LAB — BASIC METABOLIC PANEL
BUN: 20 mg/dL (ref 6–23)
CO2: 27 mEq/L (ref 19–32)
Calcium: 8.9 mg/dL (ref 8.4–10.5)
Chloride: 101 mEq/L (ref 96–112)
Creatinine, Ser: 0.93 mg/dL (ref 0.40–1.20)
GFR: 59.34 mL/min — ABNORMAL LOW (ref >60.00)
Glucose, Bld: 91 mg/dL (ref 70–99)
Potassium: 4.1 mEq/L (ref 3.5–5.1)
Sodium: 136 mEq/L (ref 135–145)

## 2023-08-05 MED ORDER — ALBUTEROL SULFATE (2.5 MG/3ML) 0.083% IN NEBU: 2.5000 mg | INHALATION_SOLUTION | Freq: Four times a day (QID) | RESPIRATORY_TRACT | 1 refills | Status: DC | PRN

## 2023-08-05 MED ORDER — PREDNISONE 20 MG PO TABS: ORAL_TABLET | ORAL | 0 refills | Status: DC

## 2023-08-05 NOTE — Patient Outreach (Signed)
  Care Coordination   08/05/2023 Name: COURAGE MOWER MRN: [id number] DOB: February 19, 1946   Care Coordination Outreach Attempts:  A second unsuccessful outreach was attempted today to offer the patient with information about available care coordination services.  Follow Up Plan:  Additional outreach attempts will be made to offer the patient care coordination information and services.   Encounter Outcome:  No Answer   Care Coordination Interventions:  No, not indicated    Bary Leriche, RN, MSN [facility] Care Management Care Management Coordinator Direct Line [phone]

## 2023-08-05 NOTE — Progress Notes (Signed)
OFFICE VISIT  08/05/2023  CC:  Chief Complaint  Patient presents with   Medical Management of Chronic Issues    Pt has been in ICU for the last 2 weeks due to difficulty breathing.     Patient is a 77 y.o. female who presents for hospital follow up. Linda [name] was admitted 07/06/2023 to 07/21/2023. I reviewed all data from the hospitalization today. She was admitted with acute on chronic hypoxic respiratory failure.  She was treated with optimal IV steroids, IV antibiotics and bronchodilators, and antitussives.  She had a prolonged hospital course due to very gradual improvement.  The presumed diagnosis was acute asthmatic flare due to acute sinusitis.  She was oxygenating appropriately on room air prior to discharge but since she has been on long-term 24/7 oxygen therapy she did not feel comfortable with going home without oxygen. She has chronic diastolic heart failure and the hospitalist discharged her home on Lasix 20 mg a day, which was a new med for her. Other discharge medications for prednisone x 3 days, albuterol inhaler, alprazolam, amlodipine, aspirin, Tessalon, Breztri, duloxetine, gabapentin, Imdur, Lamictal, loratadine, methocarbamol, Toprol-XL, Singulair, Percocet, pantoprazole, ropinirole, rosuvastatin, so Fenesin, and trazodone.  INTERIM HX: She feels like her cough and wheezing has still been prominent.  Still feels short of breath above what she has felt on a chronic basis. She wears 3 L of oxygen 24/7 but is not clear if she has checked her oxygen sat on room air. No lower extremity edema.  She is urinating quite a bit more on Lasix. She is taking all of her medications appropriately.   PMP AWARE reviewed today: most recent rx for alprazolam was filled 07/24/2023, # 90, rx by me. Gabapentin and oxycodone are prescribed by her pain management physician. No red flags.  ROS as above, plus--> no fevers, no CP, no dizziness, no HAs, no rashes, no melena/hematochezia.  No polyuria or  polydipsia.  No focal weakness, paresthesias, or tremors.  No acute vision or hearing abnormalities.  No dysuria .  No recent changes in lower legs. No n/v/d or abd pain.  No palpitations.    Past Medical History:  Diagnosis Date   Acute upper GI bleed 06/2014   while pt taking coumadin, plavix, and meloxicam---despite being told not to take coumadin.   Anginal pain (HCC)    Nonobstructive CAD 2014; however, her cardiologist put her on a statin for this and NOT for hyperlipidemia per pt report.  Atyp CP 08/2017 at card f/u, plan for myoc perf imaging.   Anxiety    panic attacks   BPPV (benign paroxysmal positional vertigo) 12/16/2012   CAD (coronary artery disease)    Nonobstructive ([facility])   Chronic diastolic CHF (congestive heart failure) (HCC)    dry wt as of 11/06/16 is 168 lbs.   Chronic lower back pain    DDD (degenerative disc disease)    lumbar and cervical.    Diverticular disease    Fibromyalgia    Patient states dx was around her late 74s but she had sx's for years prior to this.   H/O hiatal hernia    History of pneumonia    hospitalized 12/2011, 02/2013, and 07/2013 [facility]) for this   HTN (hypertension)    Renal artery dopplers 04/2013 neg for stenosis.   Hypervitaminosis D 09/27/2019   over-supplemented.  Stopped vit D and plan recheck 2 mo.   Idiopathic angio-edema-urticaria 82956; 2021   Angioedema component was very minimal.  2021->Dr. Bobbitt (allergist) eval.  Insomnia    Iron deficiency anemia    Hematologist in [place], [place] did extensive w/u; no cause found; failed oral supplement;; gets fairly regular (q60m or so) IV iron infusions (Venofer -iron sucrose- 200mg  with procrit.  "for 14 yr I've been getting blood work q month & getting infusions prn" (07/12/2013).  Dr. Myna Hidalgo locally, iron infusions done, EPO deficiency dx'd   Migraine syndrome    "not as often anymore; used to be ~ q wk" (07/12/2013)   Mixed incontinence urge and stress    Nephrolithiasis     "passed all on my own or they are still in there" (07/12/2013)   Neuroleptic induced parkinsonism (HCC) 2018   Dr. Arbutus Leas, neuro, saw her 11/24/17 and recommended d/c of abilify as first step.  D/c'd abilify and pt got complete recovery.   Oropharyngeal dysphagia    swallowing study speech path 05/2020. Gastric bx's showed gastritis, h pylori NEG   OSA on CPAP    prior to move to [place]--had another sleep study 10/2015 w/pulm Dr. Su Monks.   Osteoarthritis    "severe; progressing fast" (07/12/2013); multiple joints-not surgical candidate for TKR (03/2015).  Triamcinolon knee injections by Dr. Hermelinda Medicus 12/2017.   Pernicious anemia 08/24/2014   Pleural plaque with presence of asbestos 07/22/2013   Pulmonary embolism (HCC) 07/2013   Dx at [facility] with very small peripheral upper lobe pe 07/2013: pt took coumadin for about 8-9 mo   Pyelonephritis    "several times over the last 30 yr" (07/12/2013)   RBBB (right bundle branch block)    Recurrent major depression (HCC)    Recurrent UTI    hx of hospitalization for pyelonephritis; started abx prophylaxis 06/2015   Syncope    Hypotensive; ED visit--Dr. Sharyn Lull did Cath--nonobstructive CAD, EF 55-60%.  In retrospect, suspect pt rx med misuse/polypharmacy   Vertebral compression fracture (HCC)    Acute T12 on 02/27/22 (fall).  multiple old thoracic-->neurosurg to do MRI as of 05/2022    Past Surgical History:  Procedure Laterality Date   APPENDECTOMY  1960   AXILLARY SURGERY Left 1978   Multiple "lump" in armpit per pt   BIOPSY  06/17/2020   Gastric bx->gastritis, h pylori neg.  Procedure: BIOPSY;  Surgeon: Jeani Hawking, MD;  Location: [facility];  Service: Endoscopy;;   CARDIAC CATHETERIZATION  01/2013   nonobstructive CAD, EF 55-60%   CARDIOVASCULAR STRESS TEST  02/22/2015   Low risk myocard perf imaging; wall motion normal, normal EF   CARDIOVASCULAR STRESS TEST     09/2017 myo perf low risk   carotid duplex doppler  10/21/2017   01/2023 1-30% bilat-->rpt 1  yr   COCCYX REMOVAL  1972   CT CTA CORONARY W/CA SCORE W/CM &/OR WO/CM     04/02/23 96%'tile calcium score, nonobst CAD-->crestor increased to 40   DEXA  06/05/2017   T-score -3.1   DILATION AND CURETTAGE OF UTERUS  ? 1970's   ESOPHAGOGASTRODUODENOSCOPY N/A 07/19/2014   Gastritis found + in the setting of supratherapeutic INR, +plavix, + meloxicam.   ESOPHAGOGASTRODUODENOSCOPY (EGD) WITH PROPOFOL N/A 06/17/2020   NO stricture or other prob to explain pt's dysphagia, dilation was done anyway.  Gastric bx's-->gastritis, h pylori neg. Procedure: ESOPHAGOGASTRODUODENOSCOPY (EGD) WITH PROPOFOL;  Surgeon: Jeani Hawking, MD;  Location: [facility];  Service: Endoscopy;  Laterality: N/A;   EYE SURGERY Left 2012-2013   "injections for ~ 1 yr; don't really know what for" (07/12/2013)   HEEL SPUR SURGERY Left 2008   kidney stone removal Right  KNEE SURGERY  2005   LEFT HEART CATHETERIZATION WITH CORONARY ANGIOGRAM N/A 01/30/2013   Procedure: LEFT HEART CATHETERIZATION WITH CORONARY ANGIOGRAM;  Surgeon: Robynn Pane, MD;  Location[facility] C[facility] LAB;  Service: Cardiovascular;  Laterality: N/A;   MALONEY DILATION  06/17/2020   Procedure: Elease Hashimoto DILATION;  Surgeon: Jeani Hawking, MD;  Location: [facility];  Service: Endoscopy;;   PLANTAR FASCIA RELEASE Left 2008   SPIROMETRY  04/25/2014   In hosp for acute asthma/COPD flare: mixed obstructive and restrictive lung disease. The FEV1 is severely reduced at 45% predicted.  FEV1 signif decreased compared to prior spirometry 07/23/13.   TENDON RELEASE  1996   Right forearm and hand   TOTAL ABDOMINAL HYSTERECTOMY  1974   TRANSTHORACIC ECHOCARDIOGRAM  01/2013; 04/2014;08/2015; 09/2017   2014--NORMAL.  2015--focal basal septal hypertrophy, EF 55-60%, grade I diast dysfxn, mild LAE.  08/2015 EF 55-60%, nl LV syst fxn, grade I DD, valves wnl. 10/21/17: EF 65-70%, grd I DD, o/w normal. 02/17/22 EF 70-75%, hyperdynamic LV fxn, grd I DD.    Outpatient Medications Prior  to Visit  Medication Sig Dispense Refill   albuterol (VENTOLIN HFA) 108 (90 Base) MCG/ACT inhaler Inhale 1-2 puffs into the lungs every 6 (six) hours as needed for wheezing or shortness of breath. (Patient taking differently: Inhale 2 puffs into the lungs every 4 (four) hours as needed for wheezing or shortness of breath.) 18 g 5   ALPRAZolam (XANAX) 0.5 MG tablet TAKE ONE TABLET BY MOUTH THREE TIMES DAILY (Patient taking differently: Take 0.5 mg by mouth 3 (three) times daily.) 90 tablet 5   amLODipine (NORVASC) 5 MG tablet TAKE ONE TABLET BY MOUTH DAILY 90 tablet 3   aspirin 81 MG tablet Take 81 mg by mouth at bedtime.     benzonatate (TESSALON) 100 MG capsule Take 100 mg by mouth 2 (two) times daily as needed for cough.     BREZTRI AEROSPHERE 160-9-4.8 MCG/ACT AERO Inhale 2 puffs into the lungs 2 (two) times daily. 10.7 g 0   diclofenac Sodium (VOLTAREN) 1 % GEL Apply 2 g topically 4 (four) times daily. 4 times a day to both knees (Patient taking differently: Apply 2 g topically 4 (four) times daily as needed (for pain- to affected areas).) 1000 each 4   DULoxetine (CYMBALTA) 60 MG capsule Take 1 capsule (60 mg total) by mouth daily. (Patient taking differently: Take 60 mg by mouth at bedtime.) 90 capsule 1   EPINEPHrine 0.3 mg/0.3 mL IJ SOAJ injection Inject 0.3 mg into the muscle as needed for anaphylaxis. 1 each 1   fluticasone (FLONASE) 50 MCG/ACT nasal spray Place 1 spray into both nostrils at bedtime. 16 g 0   furosemide (LASIX) 20 MG tablet Take 1 tablet (20 mg total) by mouth daily. 30 tablet 0   gabapentin (NEURONTIN) 100 MG capsule Take 1 capsule (100 mg total) by mouth 2 (two) times daily. 60 capsule 0   guaiFENesin (MUCINEX) 600 MG 12 hr tablet Take 1 tablet (600 mg total) by mouth 2 (two) times daily. 60 tablet 0   ipratropium (ATROVENT) 0.03 % nasal spray Place 2 sprays into both nostrils every 12 (twelve) hours as needed (allergies). 30 mL 5   isosorbide mononitrate (IMDUR) 30 MG 24  hr tablet Take 2 tablets (60 mg total) by mouth in the morning and at bedtime. 270 tablet 2   lamoTRIgine (LAMICTAL) 200 MG tablet Take 1 tablet (200 mg total) by mouth daily. (Patient taking differently: Take 200  mg by mouth in the morning.) 90 tablet 1   loratadine (CLARITIN) 10 MG tablet Take 1 tablet (10 mg total) by mouth daily as needed for allergies or rhinitis. 30 tablet 0   methocarbamol (ROBAXIN) 500 MG tablet Take 1 tablet (500 mg total) by mouth every 8 (eight) hours as needed for muscle spasms. 60 tablet 2   metoprolol succinate (TOPROL-XL) 100 MG 24 hr tablet take ONE tab daily, take additional 1/2 tab as needed FOR FOR BLOOD PRESSURE > 160/90 (Patient taking differently: Take 50-100 mg by mouth See admin instructions. Take 100 mg by mouth once a day and an additional 50 mg as needed for a B/P greater than 160/90) 135 tablet 1   montelukast (SINGULAIR) 10 MG tablet Take 1 tablet (10 mg total) by mouth at bedtime. 30 tablet 0   Multiple Vitamin (MULTIVITAMIN WITH MINERALS) TABS tablet Take 1 tablet by mouth daily with breakfast.     nitroGLYCERIN (NITROSTAT) 0.4 MG SL tablet Place 1 tablet (0.4 mg total) under the tongue every 5 (five) minutes as needed for chest pain (x 3 doses). 25 tablet 11   oxyCODONE-acetaminophen (PERCOCET) 10-325 MG tablet Take 1 tablet by mouth every 6 (six) hours as needed for pain. 120 tablet 0   OXYGEN Inhale 3 L/min into the lungs continuous.     pantoprazole (PROTONIX) 40 MG tablet Take 1 tablet (40 mg total) by mouth 2 (two) times daily. 180 tablet 1   rOPINIRole (REQUIP) 0.5 MG tablet TAKE ONE TABLET BY MOUTH AT BEDTIME 90 tablet 1   rosuvastatin (CRESTOR) 40 MG tablet Take 1 tablet (40 mg total) by mouth every evening. 90 tablet 3   senna-docusate (SENOKOT-S) 8.6-50 MG tablet Take 1 tablet by mouth 2 (two) times daily. 60 tablet 0   solifenacin (VESICARE) 10 MG tablet TAKE ONE TABLET BY MOUTH ONCE DAILY **NEED OFFICE VISIT** FOR FURTHER REFILLS (Patient  taking differently: Take 10 mg by mouth daily.) 30 tablet 2   traZODone (DESYREL) 50 MG tablet take 2-4 tablets at bedtime as needed for insomnia. (Patient taking differently: Take 100-200 mg by mouth at bedtime as needed for sleep.) 120 tablet 5   No facility-administered medications prior to visit.    Allergies  Allergen Reactions   Aripiprazole Rash and Other (See Comments)    Parkinsonism, also   Penicillins Shortness Of Breath, Itching, Swelling, Rash and Other (See Comments)    Tolerated Cefepime in ED   Bactrim [Sulfamethoxazole-Trimethoprim] Rash    Review of Systems As per HPI  PE:    08/05/2023    9:52 AM 07/29/2023    9:14 AM 07/21/2023    4:20 AM  Vitals with BMI  Height  5\' 4"    Weight 155 lbs 148 lbs 10 oz   BMI 26.59 25.49   Systolic 121 124 732  Diastolic 69 78 59  Pulse 72 74 73     Physical Exam  Gen: does not appear acutely ill but has some inc WOB and coughs a lot KGU:RKYH: no injection, icteris, swelling, or exudate.  EOMI, PERRLA. Mouth: lips without lesion/swelling.  Oral mucosa pink and moist. Oropharynx without erythema, exudate, or swelling.  No sinus tenderness. CV: RRR, no murmur or rub LUNGS: diffuse soft insp crackles c/w past exams (asbestosis/fibrosis changes), but also some exp wheezing and mild labored breathing, lots of coughing during exam. EXT: no clubbing or cyanosis.  no edema.    LABS:  Last CBC Lab Results  Component Value Date  WBC 13.4 (H) 07/16/2023   HGB 13.1 07/16/2023   HCT 41.4 07/16/2023   MCV 89.4 07/16/2023   MCH 28.3 07/16/2023   RDW 13.5 07/16/2023   PLT 236 07/16/2023   Last metabolic panel Lab Results  Component Value Date   GLUCOSE 172 (H) 07/16/2023   NA 135 07/16/2023   K 3.6 07/16/2023   CL 98 07/16/2023   CO2 28 07/16/2023   BUN 28 (H) 07/16/2023   CREATININE 0.86 07/16/2023   GFRNONAA >60 07/16/2023   CALCIUM 8.8 (L) 07/16/2023   PHOS 4.0 02/27/2022   PROT 7.0 07/16/2023   ALBUMIN 3.5  07/16/2023   BILITOT 0.5 07/16/2023   ALKPHOS 58 07/16/2023   AST 22 07/16/2023   ALT 29 07/16/2023   ANIONGAP 9 07/16/2023   Last lipids Lab Results  Component Value Date   CHOL 113 01/01/2023   HDL 42.20 01/01/2023   LDLCALC 51 01/01/2023   TRIG 102.0 01/01/2023   CHOLHDL 3 01/01/2023   Last hemoglobin A1c Lab Results  Component Value Date   HGBA1C 5.3 02/27/2022   Last thyroid functions Lab Results  Component Value Date   TSH 1.488 02/27/2022   Last vitamin D Lab Results  Component Value Date   VD25OH 87.03 10/22/2019   Last vitamin B12 and Folate Lab Results  Component Value Date   VITAMINB12 962 (H) 02/27/2022   FOLATE 4.8 12/14/2012   Lab Results  Component Value Date   DDIMER 2.19 (H) 05/31/2016   IMPRESSION AND PLAN:  #1 acute on chronic hypoxic respiratory failure due to acute asthma exacerbation and pulmonary asbestosis. Her improvement seems to have plateaued since the time of discharge from the hospital. Will treat with prednisone 40 mg x 5 days, 20 mg x 5 days, then 10 mg x 5 days. Albuterol nebulizer solution prescribed, 1 neb 4 times daily as needed. DuoNeb was administered here today.  Continue Breztri 2 puffs twice a day. Check basic metabolic panel.  #2 chronic diastolic heart failure. Continue Lasix 20 mg a day, Toprol-XL 100 mg a day, and Imdur 60 mg a day.  #3 treatment resistant depression, severe generalized anxiety disorder. Stable. Continue duloxetine 60 mg a day, Lamictal 200 mg a day, and Xanax 0.5 mg 3 times a day.  Spent 44 min with pt today reviewing HPI, reviewing relevant past history, doing exam, reviewing and discussing lab and imaging data, and formulating plans.  An After Visit Summary was printed and given to the patient.  FOLLOW UP: No follow-ups on file.  Signed:  Santiago Bumpers, MD           08/05/2023

## 2023-08-06 ENCOUNTER — Telehealth: Payer: Self-pay | Admitting: Family Medicine

## 2023-08-06 ENCOUNTER — Encounter: Payer: Medicare Other | Attending: Physical Medicine & Rehabilitation | Admitting: Physical Medicine & Rehabilitation

## 2023-08-06 ENCOUNTER — Encounter: Payer: Self-pay | Admitting: Physical Medicine & Rehabilitation

## 2023-08-06 VITALS — BP 104/64 | HR 74 | Ht 64.0 in | Wt 155.0 lb

## 2023-08-06 DIAGNOSIS — M1711 Unilateral primary osteoarthritis, right knee: Secondary | ICD-10-CM | POA: Insufficient documentation

## 2023-08-06 DIAGNOSIS — M1712 Unilateral primary osteoarthritis, left knee: Secondary | ICD-10-CM | POA: Insufficient documentation

## 2023-08-06 DIAGNOSIS — M6281 Muscle weakness (generalized): Secondary | ICD-10-CM | POA: Diagnosis not present

## 2023-08-06 DIAGNOSIS — M5416 Radiculopathy, lumbar region: Secondary | ICD-10-CM | POA: Diagnosis not present

## 2023-08-06 DIAGNOSIS — R2689 Other abnormalities of gait and mobility: Secondary | ICD-10-CM | POA: Diagnosis not present

## 2023-08-06 MED ORDER — OXYCODONE-ACETAMINOPHEN 10-325 MG PO TABS
1.0000 | ORAL_TABLET | Freq: Four times a day (QID) | ORAL | 0 refills | Status: DC | PRN
Start: 2023-08-06 — End: 2023-10-07

## 2023-08-06 MED ORDER — TRIAMCINOLONE ACETONIDE 32 MG IX SRER
64.0000 mg | Freq: Once | INTRA_ARTICULAR | Status: AC
Start: 2023-08-06 — End: 2023-08-06
  Administered 2023-08-06: 64 mg via INTRA_ARTICULAR

## 2023-08-06 NOTE — Telephone Encounter (Signed)
Pt see Dr.Wert for pulmonary issues.  Please advise if this is something you can order

## 2023-08-06 NOTE — Telephone Encounter (Signed)
Patient is requesting that Dr. Milinda Cave prescribe her a portable oxygen tank. She reports that the one she has is about 77 years old and she purchased it herself and it does not hold a proper charge anymore. She needs this for when she leaves home.

## 2023-08-06 NOTE — Patient Instructions (Signed)
ALWAYS FEEL FREE TO CALL OUR OFFICE WITH ANY PROBLEMS OR QUESTIONS ([phone])  **PLEASE NOTE** ALL MEDICATION REFILL REQUESTS (INCLUDING CONTROLLED SUBSTANCES) NEED TO BE MADE AT LEAST 7 DAYS PRIOR TO REFILL BEING DUE. ANY REFILL REQUESTS INSIDE THAT TIME FRAME MAY RESULT IN DELAYS IN RECEIVING YOUR PRESCRIPTION.                    

## 2023-08-06 NOTE — Telephone Encounter (Signed)
This has to be done through her pulmonary provider

## 2023-08-06 NOTE — Progress Notes (Signed)
PROCEDURE NOTE  DIAGNOSIS:  OA of bilateral knees  INTERVENTION:   ZILRETTA INJECTION     After informed consent and preparation of the skin with betadine and isopropyl alcohol, I injected 32MG  of zilretta dissolved into 5cc of diluent into both knees via anterolateral approach. Contents of syring were shaken vigorously and aspiration was performed prior to injection. The patient tolerated well, and no complications were encountered. Afterward the area was cleaned and dressed. Post- injection instructions were provided including ice  if swelling or pain should occur.     Refilled percocet   Ranelle Oyster, MD, [facility] 08/06/2023

## 2023-08-06 NOTE — Telephone Encounter (Signed)
LVM for pt to return call.  Note; request for portable oxygen tank will have to be done by Dr.Wert.

## 2023-08-07 ENCOUNTER — Telehealth: Payer: Self-pay | Admitting: Internal Medicine

## 2023-08-07 ENCOUNTER — Telehealth: Payer: Self-pay | Admitting: *Deleted

## 2023-08-07 DIAGNOSIS — R41841 Cognitive communication deficit: Secondary | ICD-10-CM | POA: Diagnosis not present

## 2023-08-07 DIAGNOSIS — R490 Dysphonia: Secondary | ICD-10-CM | POA: Diagnosis not present

## 2023-08-07 NOTE — Telephone Encounter (Signed)
Patient returned call regarding portable oxygen tank that she is requesting.  She is aware that tank must be ordered by Dr. Tilman Neat, patient's pulmonary doctor.  She states she has an appt with him at the end of the month and will request then.

## 2023-08-07 NOTE — Telephone Encounter (Signed)
Patient requesting refill on Butrans patch.  07/02/23 ov visit Cervicalgia/ Cervical Radiculitis: Continue HEP as Tolerated.  Left Pelvic Pain/ Left Hip Pain: No complaints today. Continue HEP  .   3. Functional deficits secondary to Gait disorder:Continue with HEP. 06/11/2023. 4. Chronic Back pain/ Lumbar Radiculitis/fibromyalgia /R>L Knee OA Pain Management:    We will continue the controlled substance monitoring program, this consists of regular clinic visits, examinations, routine drug screening, pill counts as well as use of [place] Controlled Substance Reporting System. [place] today.    -Continue Voltaren Gel. -begin trial of butrans patch weekly. Need to be careful with opioid load/tolerance. She has been on oxycodone for some time, but we still need to tread carefully. I reiterated that to her today. butrans is a very conservative dose. If she has any issues with medication, she was advised to remove it immediately. I discussed application, do/don'ts with her today 5. Depression with anxiety/Grief reaction/Mood: Continue Xanax, Trazodone and Cymbalta . PCP   6. [name] with asthma/hypoxia:. Albuterol prn. :Pulmonology Following. ON 2-3l O2             -maintain nebs and close f/u with pulmonology! 7.. Bilateral Osteoarthritis of Bilateral Knees -she feels that zilretta is still giving her 3+ mos of relief -will repeat injections in a month 8. Fibromyalgia: Continue Home exercise regimen as tolerated.   9. Bilateral  Greater Trochanteric Bursitis: Continue to  Alternate Ice and Heat Therapy.   10. Chronic Bilateral Shoulder Pain:

## 2023-08-07 NOTE — Telephone Encounter (Signed)
Pt called in to get Portable oxygen machine until appt. Pt states they can't leave home without it

## 2023-08-08 MED ORDER — BUPRENORPHINE 5 MCG/HR TD PTWK: 1.0000 | MEDICATED_PATCH | TRANSDERMAL | 2 refills | Status: AC

## 2023-08-08 NOTE — Telephone Encounter (Signed)
I called and spoke with pt. Butrans must have dropped off while she was in hospital. She feels that it's helping with her pain. Refills sent

## 2023-08-08 NOTE — Addendum Note (Signed)
Addended by: Faith Rogue T on: 08/08/2023 12:04 PM   Modules accepted: Orders

## 2023-08-11 ENCOUNTER — Telehealth: Payer: Self-pay

## 2023-08-11 DIAGNOSIS — R490 Dysphonia: Secondary | ICD-10-CM | POA: Diagnosis not present

## 2023-08-11 DIAGNOSIS — M6281 Muscle weakness (generalized): Secondary | ICD-10-CM | POA: Diagnosis not present

## 2023-08-11 DIAGNOSIS — R41841 Cognitive communication deficit: Secondary | ICD-10-CM | POA: Diagnosis not present

## 2023-08-11 DIAGNOSIS — R2689 Other abnormalities of gait and mobility: Secondary | ICD-10-CM | POA: Diagnosis not present

## 2023-08-11 NOTE — Patient Outreach (Signed)
  Care Coordination   08/11/2023 Name: SHELLYANN VEENSTRA MRN: [id number] DOB: 11-10-1946   Care Coordination Outreach Attempts:  A third unsuccessful outreach was attempted today to offer the patient with information about available care coordination services.  Follow Up Plan:  No further outreach attempts will be made at this time. We have been unable to contact the patient to offer or enroll patient in care coordination services  Encounter Outcome:  No Answer   Care Coordination Interventions:  No, not indicated     Bary Leriche, RN, MSN [facility] Care Management Care Management Coordinator Direct Line [phone]

## 2023-08-12 ENCOUNTER — Other Ambulatory Visit: Payer: Self-pay | Admitting: Family Medicine

## 2023-08-12 DIAGNOSIS — M6281 Muscle weakness (generalized): Secondary | ICD-10-CM | POA: Diagnosis not present

## 2023-08-12 NOTE — Telephone Encounter (Signed)
Per  chart patient has not been seen by [facility] since 2021. She does have scheduled OV with Katie on 08/20/23. Also, per chart, patient has already established with [facility] with LOV 06/03/23. They have ordered PFTs. It appears she was going back and forth between [facility] and [facility] for many years.  Patient can contact [facility] since this was the most recent pulm visit. LVM to return call.  MyChart message also sent.

## 2023-08-12 NOTE — Telephone Encounter (Signed)
Patient called back- informed patient we have not saw her since 2021. Patient states she has not contacted Dr. Fransisca Connors office. Patient would like a call back from a nurse.

## 2023-08-13 DIAGNOSIS — R2689 Other abnormalities of gait and mobility: Secondary | ICD-10-CM | POA: Diagnosis not present

## 2023-08-13 DIAGNOSIS — M6281 Muscle weakness (generalized): Secondary | ICD-10-CM | POA: Diagnosis not present

## 2023-08-13 NOTE — Telephone Encounter (Signed)
See teleph enc from 08/07/23 for additional info.

## 2023-08-13 NOTE — Telephone Encounter (Signed)
Spoke with patient and explained she has not been seen in this office since 2021, which means we cannot provide her with any orders for oxygen, as we would not have the necessary documentation for the requirements. Reminded her of her recent OV with Dr. Su Monks ([facility]) in June, and recommended she contact their office for assistance. Also advised she cannot have 2 pulmonary providers and she will need to decide which one she would like to continue care with. Patient voiced understanding and will contact Dr. Fransisca Connors office. Nothing further needed at this time.

## 2023-08-14 ENCOUNTER — Telehealth: Payer: Self-pay | Admitting: Family Medicine

## 2023-08-14 ENCOUNTER — Ambulatory Visit: Payer: Medicare Other | Admitting: Family Medicine

## 2023-08-14 DIAGNOSIS — M6281 Muscle weakness (generalized): Secondary | ICD-10-CM | POA: Diagnosis not present

## 2023-08-14 NOTE — Telephone Encounter (Signed)
Patient is requesting medication for the blisters in her mouth from all the breathing treatments she received while in the hospital for 16 days.  If a medication is approved she asked that it go to the [facility] on [address] in [place].

## 2023-08-15 DIAGNOSIS — R490 Dysphonia: Secondary | ICD-10-CM | POA: Diagnosis not present

## 2023-08-15 DIAGNOSIS — R41841 Cognitive communication deficit: Secondary | ICD-10-CM | POA: Diagnosis not present

## 2023-08-18 DIAGNOSIS — R2689 Other abnormalities of gait and mobility: Secondary | ICD-10-CM | POA: Diagnosis not present

## 2023-08-18 DIAGNOSIS — M6281 Muscle weakness (generalized): Secondary | ICD-10-CM | POA: Diagnosis not present

## 2023-08-19 ENCOUNTER — Ambulatory Visit (INDEPENDENT_AMBULATORY_CARE_PROVIDER_SITE_OTHER): Payer: Medicare Other | Admitting: Family Medicine

## 2023-08-19 ENCOUNTER — Encounter: Payer: Self-pay | Admitting: Family Medicine

## 2023-08-19 VITALS — BP 119/72 | HR 68 | Wt 158.2 lb

## 2023-08-19 DIAGNOSIS — J4541 Moderate persistent asthma with (acute) exacerbation: Secondary | ICD-10-CM | POA: Diagnosis not present

## 2023-08-19 DIAGNOSIS — J9611 Chronic respiratory failure with hypoxia: Secondary | ICD-10-CM

## 2023-08-19 DIAGNOSIS — K146 Glossodynia: Secondary | ICD-10-CM

## 2023-08-19 DIAGNOSIS — I5032 Chronic diastolic (congestive) heart failure: Secondary | ICD-10-CM

## 2023-08-19 DIAGNOSIS — R0602 Shortness of breath: Secondary | ICD-10-CM

## 2023-08-19 DIAGNOSIS — Z23 Encounter for immunization: Secondary | ICD-10-CM

## 2023-08-19 LAB — CBC WITH DIFFERENTIAL/PLATELET
Basophils Absolute: 0 10*3/uL (ref 0.0–0.1)
Basophils Relative: 0.2 % (ref 0.0–3.0)
Eosinophils Absolute: 0.1 10*3/uL (ref 0.0–0.7)
Eosinophils Relative: 0.6 % (ref 0.0–5.0)
HCT: 38.5 % (ref 36.0–46.0)
Hemoglobin: 12.4 g/dL (ref 12.0–15.0)
Lymphocytes Relative: 23.5 % (ref 12.0–46.0)
Lymphs Abs: 2.5 10*3/uL (ref 0.7–4.0)
MCHC: 32.3 g/dL (ref 30.0–36.0)
MCV: 88.7 fl (ref 78.0–100.0)
Monocytes Absolute: 0.9 10*3/uL (ref 0.1–1.0)
Monocytes Relative: 8.6 % (ref 3.0–12.0)
Neutro Abs: 7.1 10*3/uL (ref 1.4–7.7)
Neutrophils Relative %: 67.1 % (ref 43.0–77.0)
Platelets: 223 10*3/uL (ref 150.0–400.0)
RBC: 4.34 Mil/uL (ref 3.87–5.11)
RDW: 14.8 % (ref 11.5–15.5)
WBC: 10.6 10*3/uL — ABNORMAL HIGH (ref 4.0–10.5)

## 2023-08-19 LAB — BRAIN NATRIURETIC PEPTIDE: Pro B Natriuretic peptide (BNP): 143 pg/mL — ABNORMAL HIGH (ref 0.0–100.0)

## 2023-08-19 LAB — BASIC METABOLIC PANEL
BUN: 23 mg/dL (ref 6–23)
CO2: 31 mEq/L (ref 19–32)
Calcium: 8.6 mg/dL (ref 8.4–10.5)
Chloride: 104 mEq/L (ref 96–112)
Creatinine, Ser: 0.78 mg/dL (ref 0.40–1.20)
GFR: 73.26 mL/min (ref >60.00)
Glucose, Bld: 103 mg/dL — ABNORMAL HIGH (ref 70–99)
Potassium: 4.3 mEq/L (ref 3.5–5.1)
Sodium: 141 mEq/L (ref 135–145)

## 2023-08-19 MED ORDER — PREDNISONE 10 MG PO TABS: ORAL_TABLET | ORAL | 0 refills | Status: DC

## 2023-08-19 MED ORDER — FLUCONAZOLE 150 MG PO TABS: 150.0000 mg | ORAL_TABLET | Freq: Every day | ORAL | 0 refills | Status: DC

## 2023-08-19 NOTE — Addendum Note (Signed)
Addended by: Arty Baumgartner A on: 08/19/2023 10:47 AM   Modules accepted: Orders

## 2023-08-19 NOTE — Progress Notes (Signed)
OFFICE VISIT  08/19/2023  CC:  Chief Complaint  Patient presents with   Asthma    F/u. Pt states she is not quite as congested as she was, not coughing as much as she was either. Pt states SOB is still severe.     Patient is a 77 y.o. female who presents for 2-week follow-up asthma exacerbation. A/P as of last visit: "1 acute on chronic hypoxic respiratory failure due to acute asthma exacerbation and pulmonary asbestosis. Her improvement seems to have plateaued since the time of discharge from the hospital. Will treat with prednisone 40 mg x 5 days, 20 mg x 5 days, then 10 mg x 5 days. Albuterol nebulizer solution prescribed, 1 neb 4 times daily as needed. DuoNeb was administered here today.  Continue Breztri 2 puffs twice a day. Check basic metabolic panel.   #2 chronic diastolic heart failure. Continue Lasix 20 mg a day, Toprol-XL 100 mg a day, and Imdur 60 mg a day.   #3 treatment resistant depression, severe generalized anxiety disorder. Stable. Continue duloxetine 60 mg a day, Lamictal 200 mg a day, and Xanax 0.5 mg 3 times a day."  INTERIM HX: Still feels very congested, some productive cough, wheezing, feeling more short of breath than her baseline. She admits she is a little better but not a lot.  No fever.  Occasional nausea but no vomiting. She takes her Lasix an average of 3 to 4 days a week.  She avoids it when she has to be out for appointments. She is on her usual 3 L of oxygen.  Using albuterol nebs several times per day.  ROS as above, plus--> no CP,  no dizziness, no HAs, no rashes, no melena/hematochezia.  No polyuria or polydipsia.  No focal weakness, paresthesias, or tremors.  No acute vision or hearing abnormalities.  No dysuria or unusual/new urinary urgency or frequency.  NO swelling of the legs. No vomiting, diarrhea, or abdominal pain. no palpitations.     Past Medical History:  Diagnosis Date   Acute upper GI bleed 06/2014   while pt taking coumadin,  plavix, and meloxicam---despite being told not to take coumadin.   Anginal pain (HCC)    Nonobstructive CAD 2014; however, her cardiologist put her on a statin for this and NOT for hyperlipidemia per pt report.  Atyp CP 08/2017 at card f/u, plan for myoc perf imaging.   Anxiety    panic attacks   BPPV (benign paroxysmal positional vertigo) 12/16/2012   CAD (coronary artery disease)    Nonobstructive ([facility])   Chronic diastolic CHF (congestive heart failure) (HCC)    dry wt as of 11/06/16 is 168 lbs.   Chronic lower back pain    DDD (degenerative disc disease)    lumbar and cervical.    Diverticular disease    Fibromyalgia    Patient states dx was around her late 20s but she had sx's for years prior to this.   H/O hiatal hernia    History of pneumonia    hospitalized 12/2011, 02/2013, and 07/2013 [facility]) for this   HTN (hypertension)    Renal artery dopplers 04/2013 neg for stenosis.   Hypervitaminosis D 09/27/2019   over-supplemented.  Stopped vit D and plan recheck 2 mo.   Idiopathic angio-edema-urticaria 57846; 2021   Angioedema component was very minimal.  2021->Dr. Bobbitt (allergist) eval.   Insomnia    Iron deficiency anemia    Hematologist in [place], [place] did extensive w/u; no cause found; failed  oral supplement;; gets fairly regular (q31m or so) IV iron infusions (Venofer -iron sucrose- 200mg  with procrit.  "for 14 yr I've been getting blood work q month & getting infusions prn" (07/12/2013).  Dr. Myna Hidalgo locally, iron infusions done, EPO deficiency dx'd   Migraine syndrome    "not as often anymore; used to be ~ q wk" (07/12/2013)   Mixed incontinence urge and stress    Nephrolithiasis    "passed all on my own or they are still in there" (07/12/2013)   Neuroleptic induced parkinsonism (HCC) 2018   Dr. Arbutus Leas, neuro, saw her 11/24/17 and recommended d/c of abilify as first step.  D/c'd abilify and pt got complete recovery.   Oropharyngeal dysphagia    swallowing study speech path  05/2020. Gastric bx's showed gastritis, h pylori NEG   OSA on CPAP    prior to move to [place]--had another sleep study 10/2015 w/pulm Dr. Su Monks.   Osteoarthritis    "severe; progressing fast" (07/12/2013); multiple joints-not surgical candidate for TKR (03/2015).  Triamcinolon knee injections by Dr. Hermelinda Medicus 12/2017.   Pernicious anemia 08/24/2014   Pleural plaque with presence of asbestos 07/22/2013   Pulmonary embolism (HCC) 07/2013   Dx at [facility] with very small peripheral upper lobe pe 07/2013: pt took coumadin for about 8-9 mo   Pyelonephritis    "several times over the last 30 yr" (07/12/2013)   RBBB (right bundle branch block)    Recurrent major depression (HCC)    Recurrent UTI    hx of hospitalization for pyelonephritis; started abx prophylaxis 06/2015   Syncope    Hypotensive; ED visit--Dr. Sharyn Lull did Cath--nonobstructive CAD, EF 55-60%.  In retrospect, suspect pt rx med misuse/polypharmacy   Vertebral compression fracture (HCC)    Acute T12 on 02/27/22 (fall).  multiple old thoracic-->neurosurg to do MRI as of 05/2022    Past Surgical History:  Procedure Laterality Date   APPENDECTOMY  1960   AXILLARY SURGERY Left 1978   Multiple "lump" in armpit per pt   BIOPSY  06/17/2020   Gastric bx->gastritis, h pylori neg.  Procedure: BIOPSY;  Surgeon: Jeani Hawking, MD;  Location: [facility];  Service: Endoscopy;;   CARDIAC CATHETERIZATION  01/2013   nonobstructive CAD, EF 55-60%   CARDIOVASCULAR STRESS TEST  02/22/2015   Low risk myocard perf imaging; wall motion normal, normal EF   CARDIOVASCULAR STRESS TEST     09/2017 myo perf low risk   carotid duplex doppler  10/21/2017   01/2023 1-30% bilat-->rpt 1 yr   COCCYX REMOVAL  1972   CT CTA CORONARY W/CA SCORE W/CM &/OR WO/CM     04/02/23 96%'tile calcium score, nonobst CAD-->crestor increased to 40   DEXA  06/05/2017   T-score -3.1   DILATION AND CURETTAGE OF UTERUS  ? 1970's   ESOPHAGOGASTRODUODENOSCOPY N/A 07/19/2014   Gastritis found +  in the setting of supratherapeutic INR, +plavix, + meloxicam.   ESOPHAGOGASTRODUODENOSCOPY (EGD) WITH PROPOFOL N/A 06/17/2020   NO stricture or other prob to explain pt's dysphagia, dilation was done anyway.  Gastric bx's-->gastritis, h pylori neg. Procedure: ESOPHAGOGASTRODUODENOSCOPY (EGD) WITH PROPOFOL;  Surgeon: Jeani Hawking, MD;  Location: [facility];  Service: Endoscopy;  Laterality: N/A;   EYE SURGERY Left 2012-2013   "injections for ~ 1 yr; don't really know what for" (07/12/2013)   HEEL SPUR SURGERY Left 2008   kidney stone removal Right    KNEE SURGERY  2005   LEFT HEART CATHETERIZATION WITH CORONARY ANGIOGRAM N/A 01/30/2013   Procedure: LEFT  HEART CATHETERIZATION WITH CORONARY ANGIOGRAM;  Surgeon: Robynn Pane, MD;  Location: [facility] C[facility] LAB;  Service: Cardiovascular;  Laterality: N/A;   MALONEY DILATION  06/17/2020   Procedure: Elease Hashimoto DILATION;  Surgeon: Jeani Hawking, MD;  Location: [facility];  Service: Endoscopy;;   nasolaryngoscopy     06/2023 during hosp for resp failure-->normal   PLANTAR FASCIA RELEASE Left 2008   SPIROMETRY  04/25/2014   In hosp for acute asthma/COPD flare: mixed obstructive and restrictive lung disease. The FEV1 is severely reduced at 45% predicted.  FEV1 signif decreased compared to prior spirometry 07/23/13.   TENDON RELEASE  1996   Right forearm and hand   TOTAL ABDOMINAL HYSTERECTOMY  1974   TRANSTHORACIC ECHOCARDIOGRAM  01/2013; 04/2014;08/2015; 09/2017   2014--NORMAL.  2015--focal basal septal hypertrophy, EF 55-60%, grade I diast dysfxn, mild LAE.  08/2015 EF 55-60%, nl LV syst fxn, grade I DD, valves wnl. 10/21/17: EF 65-70%, grd I DD, o/w normal. 02/17/22 EF 70-75%, hyperdynamic LV fxn, grd I DD.    Outpatient Medications Prior to Visit  Medication Sig Dispense Refill   albuterol (PROVENTIL) (2.5 MG/3ML) 0.083% nebulizer solution Take 3 mLs (2.5 mg total) by nebulization every 6 (six) hours as needed for wheezing or shortness of breath. 75 mL 1    albuterol (VENTOLIN HFA) 108 (90 Base) MCG/ACT inhaler Inhale 1-2 puffs into the lungs every 6 (six) hours as needed for wheezing or shortness of breath. (Patient taking differently: Inhale 2 puffs into the lungs every 4 (four) hours as needed for wheezing or shortness of breath.) 18 g 5   ALPRAZolam (XANAX) 0.5 MG tablet TAKE ONE TABLET BY MOUTH THREE TIMES DAILY (Patient taking differently: Take 0.5 mg by mouth 3 (three) times daily.) 90 tablet 5   amLODipine (NORVASC) 5 MG tablet TAKE ONE TABLET BY MOUTH DAILY 90 tablet 3   aspirin 81 MG tablet Take 81 mg by mouth at bedtime.     benzonatate (TESSALON) 100 MG capsule Take 100 mg by mouth 2 (two) times daily as needed for cough.     BREZTRI AEROSPHERE 160-9-4.8 MCG/ACT AERO Inhale 2 puffs into the lungs 2 (two) times daily. 10.7 g 0   buprenorphine (BUTRANS) 5 MCG/HR PTWK Place 1 patch onto the skin once a week. 4 patch 2   diclofenac Sodium (VOLTAREN) 1 % GEL Apply 2 g topically 4 (four) times daily. 4 times a day to both knees (Patient taking differently: Apply 2 g topically 4 (four) times daily as needed (for pain- to affected areas).) 1000 each 4   DULoxetine (CYMBALTA) 60 MG capsule Take 1 capsule (60 mg total) by mouth daily. (Patient taking differently: Take 60 mg by mouth at bedtime.) 90 capsule 1   EPINEPHrine 0.3 mg/0.3 mL IJ SOAJ injection Inject 0.3 mg into the muscle as needed for anaphylaxis. 1 each 1   fluticasone (FLONASE) 50 MCG/ACT nasal spray Place 1 spray into both nostrils at bedtime. 16 g 0   furosemide (LASIX) 20 MG tablet Take 1 tablet (20 mg total) by mouth daily. 30 tablet 0   gabapentin (NEURONTIN) 100 MG capsule Take 1 capsule (100 mg total) by mouth 2 (two) times daily. 60 capsule 0   guaiFENesin (MUCINEX) 600 MG 12 hr tablet Take 1 tablet (600 mg total) by mouth 2 (two) times daily. 60 tablet 0   ipratropium (ATROVENT) 0.03 % nasal spray Place 2 sprays into both nostrils every 12 (twelve) hours as needed (allergies). 30  mL 5  isosorbide mononitrate (IMDUR) 30 MG 24 hr tablet Take 2 tablets (60 mg total) by mouth in the morning and at bedtime. 270 tablet 2   lamoTRIgine (LAMICTAL) 200 MG tablet TAKE ONE TABLET BY MOUTH DAILY 90 tablet 1   loratadine (CLARITIN) 10 MG tablet Take 1 tablet (10 mg total) by mouth daily as needed for allergies or rhinitis. 30 tablet 0   methocarbamol (ROBAXIN) 500 MG tablet Take 1 tablet (500 mg total) by mouth every 8 (eight) hours as needed for muscle spasms. 60 tablet 2   metoprolol succinate (TOPROL-XL) 100 MG 24 hr tablet take ONE tab daily, take additional 1/2 tab as needed FOR FOR BLOOD PRESSURE > 160/90 (Patient taking differently: Take 50-100 mg by mouth See admin instructions. Take 100 mg by mouth once a day and an additional 50 mg as needed for a B/P greater than 160/90) 135 tablet 1   montelukast (SINGULAIR) 10 MG tablet Take 1 tablet (10 mg total) by mouth at bedtime. 30 tablet 0   Multiple Vitamin (MULTIVITAMIN WITH MINERALS) TABS tablet Take 1 tablet by mouth daily with breakfast.     nitroGLYCERIN (NITROSTAT) 0.4 MG SL tablet Place 1 tablet (0.4 mg total) under the tongue every 5 (five) minutes as needed for chest pain (x 3 doses). 25 tablet 11   oxyCODONE-acetaminophen (PERCOCET) 10-325 MG tablet Take 1 tablet by mouth every 6 (six) hours as needed for pain. 120 tablet 0   OXYGEN Inhale 3 L/min into the lungs continuous.     pantoprazole (PROTONIX) 40 MG tablet Take 1 tablet (40 mg total) by mouth 2 (two) times daily. 180 tablet 1   rOPINIRole (REQUIP) 0.5 MG tablet TAKE ONE TABLET BY MOUTH AT BEDTIME 90 tablet 1   rosuvastatin (CRESTOR) 40 MG tablet Take 1 tablet (40 mg total) by mouth every evening. 90 tablet 3   senna-docusate (SENOKOT-S) 8.6-50 MG tablet Take 1 tablet by mouth 2 (two) times daily. 60 tablet 0   solifenacin (VESICARE) 10 MG tablet Take 1 tablet (10 mg total) by mouth daily. 30 tablet 2   traZODone (DESYREL) 50 MG tablet TAKE 2-4 TABLETS BY MOUTH AT  BEDTIME AS NEEDED INSOMNIA 120 tablet 2   predniSONE (DELTASONE) 20 MG tablet 2 tabs po every day x 5d then 1 tab po every day x 5d, then 1/2 tab po every day x 6d 18 tablet 0   No facility-administered medications prior to visit.    Allergies  Allergen Reactions   Aripiprazole Rash and Other (See Comments)    Parkinsonism, also   Penicillins Shortness Of Breath, Itching, Swelling, Rash and Other (See Comments)    Tolerated Cefepime in ED   Bactrim [Sulfamethoxazole-Trimethoprim] Rash    Review of Systems As per HPI  PE:    08/19/2023    9:46 AM 08/06/2023    2:29 PM 08/05/2023    9:52 AM  Vitals with BMI  Height  5\' 4"    Weight 158 lbs 3 oz 155 lbs 155 lbs  BMI 27.14 26.59 26.59  Systolic 119 104 657  Diastolic 72 64 69  Pulse 68 74 72  02 sat 93% on 3L O2 [place]  Physical Exam  Gen: alert, some labored breathing.  Non-toxic appearing. Oral exam: No focal lesions, no erythema or exudate or plaques. Mucous membranes are dry. CV: RRR, no murmur LUNGS: CTA bilat, mild dec aeration on exhalation both lung fields, BS symmetric.  Mild prolongation of exp phase. EXT: no clubbing or cyanosis.  no edema.    LABS:  Last CBC Lab Results  Component Value Date   WBC 13.4 (H) 07/16/2023   HGB 13.1 07/16/2023   HCT 41.4 07/16/2023   MCV 89.4 07/16/2023   MCH 28.3 07/16/2023   RDW 13.5 07/16/2023   PLT 236 07/16/2023   Last metabolic panel Lab Results  Component Value Date   GLUCOSE 91 08/05/2023   NA 136 08/05/2023   K 4.1 08/05/2023   CL 101 08/05/2023   CO2 27 08/05/2023   BUN 20 08/05/2023   CREATININE 0.93 08/05/2023   GFR 59.34 (L) 08/05/2023   CALCIUM 8.9 08/05/2023   PHOS 4.0 02/27/2022   PROT 7.0 07/16/2023   ALBUMIN 3.5 07/16/2023   BILITOT 0.5 07/16/2023   ALKPHOS 58 07/16/2023   AST 22 07/16/2023   ALT 29 07/16/2023   ANIONGAP 9 07/16/2023   Lab Results  Component Value Date   VITAMINB12 962 (H) 02/27/2022   IMPRESSION AND PLAN:  #1 acute asthma  exacerbation superimposed on chronic hypoxic respiratory failure and chronic diastolic heart failure.   Slow to resolve. Check CBC, BMET, BNP, chest x-ray. Extend prednisone: 30 mg daily x 5 days, 20 mg daily x 5 days, 10 mg daily x 5 days.  #2 burning tongue syndrome. Although I see no evidence of thrush, she says nystatin suspension has helped some in the past.  Her pharmacy has this on backorder. Will do Diflucan 150 daily x 10 days.  An After Visit Summary was printed and given to the patient.  FOLLOW UP: Return in about 2 weeks (around 09/02/2023) for f/u asthma/resp failure.  Signed:  Santiago Bumpers, MD           08/19/2023

## 2023-08-20 ENCOUNTER — Telehealth: Payer: Self-pay | Admitting: *Deleted

## 2023-08-20 ENCOUNTER — Inpatient Hospital Stay: Payer: Medicare Other | Admitting: Nurse Practitioner

## 2023-08-20 DIAGNOSIS — M6281 Muscle weakness (generalized): Secondary | ICD-10-CM | POA: Diagnosis not present

## 2023-08-20 DIAGNOSIS — R2689 Other abnormalities of gait and mobility: Secondary | ICD-10-CM | POA: Diagnosis not present

## 2023-08-20 NOTE — Telephone Encounter (Signed)
Called and spoke with patient and offered for her to see Dr. Judeth Horn on 9/9 at 3:30 pm instead of being seen today by Katie at 2:30 pm.  She stated that would be fine.  I provided our address to her.  I asked her if she needed to be seen today since she recently left the hospital.  She said no, she could wait until 9/9.  I advised her to call the office back if anything changed.  She verbalized understanding.  Nothing further needed.

## 2023-08-21 DIAGNOSIS — M6281 Muscle weakness (generalized): Secondary | ICD-10-CM | POA: Diagnosis not present

## 2023-08-22 ENCOUNTER — Other Ambulatory Visit (HOSPITAL_COMMUNITY): Payer: Self-pay

## 2023-08-22 ENCOUNTER — Encounter: Payer: Self-pay | Admitting: Hematology & Oncology

## 2023-08-22 DIAGNOSIS — R41841 Cognitive communication deficit: Secondary | ICD-10-CM | POA: Diagnosis not present

## 2023-08-22 DIAGNOSIS — R490 Dysphonia: Secondary | ICD-10-CM | POA: Diagnosis not present

## 2023-08-22 NOTE — Addendum Note (Signed)
Addended by: Filomena Jungling on: 08/22/2023 08:55 AM   Modules accepted: Orders

## 2023-08-25 DIAGNOSIS — R41841 Cognitive communication deficit: Secondary | ICD-10-CM | POA: Diagnosis not present

## 2023-08-25 DIAGNOSIS — R490 Dysphonia: Secondary | ICD-10-CM | POA: Diagnosis not present

## 2023-08-26 DIAGNOSIS — M6281 Muscle weakness (generalized): Secondary | ICD-10-CM | POA: Diagnosis not present

## 2023-08-27 DIAGNOSIS — M6281 Muscle weakness (generalized): Secondary | ICD-10-CM | POA: Diagnosis not present

## 2023-08-27 DIAGNOSIS — R2689 Other abnormalities of gait and mobility: Secondary | ICD-10-CM | POA: Diagnosis not present

## 2023-08-28 ENCOUNTER — Ambulatory Visit
Admission: RE | Admit: 2023-08-28 | Discharge: 2023-08-28 | Disposition: A | Discharge status: Home / Self Care | Payer: Medicare Other | Source: Ambulatory Visit | Attending: Family Medicine | Admitting: Family Medicine

## 2023-08-28 DIAGNOSIS — R0602 Shortness of breath: Secondary | ICD-10-CM | POA: Diagnosis not present

## 2023-08-29 DIAGNOSIS — R41841 Cognitive communication deficit: Secondary | ICD-10-CM | POA: Diagnosis not present

## 2023-08-29 DIAGNOSIS — R490 Dysphonia: Secondary | ICD-10-CM | POA: Diagnosis not present

## 2023-09-01 ENCOUNTER — Ambulatory Visit (INDEPENDENT_AMBULATORY_CARE_PROVIDER_SITE_OTHER): Payer: Medicare Other | Admitting: Pulmonary Disease

## 2023-09-01 ENCOUNTER — Encounter: Payer: Self-pay | Admitting: Pulmonary Disease

## 2023-09-01 VITALS — BP 118/62 | HR 62 | Ht 64.0 in | Wt 159.6 lb

## 2023-09-01 DIAGNOSIS — J920 Pleural plaque with presence of asbestos: Secondary | ICD-10-CM | POA: Diagnosis not present

## 2023-09-01 DIAGNOSIS — R9389 Abnormal findings on diagnostic imaging of other specified body structures: Secondary | ICD-10-CM | POA: Diagnosis not present

## 2023-09-01 DIAGNOSIS — J439 Emphysema, unspecified: Secondary | ICD-10-CM | POA: Diagnosis not present

## 2023-09-01 DIAGNOSIS — J984 Other disorders of lung: Secondary | ICD-10-CM

## 2023-09-01 DIAGNOSIS — M6281 Muscle weakness (generalized): Secondary | ICD-10-CM | POA: Diagnosis not present

## 2023-09-01 DIAGNOSIS — R2689 Other abnormalities of gait and mobility: Secondary | ICD-10-CM | POA: Diagnosis not present

## 2023-09-01 MED ORDER — HYDROCODONE BIT-HOMATROP MBR 5-1.5 MG/5ML PO SOLN: 5.0000 mL | Freq: Every evening | ORAL | 0 refills | Status: DC | PRN

## 2023-09-01 MED ORDER — SPACER/AERO-HOLDING CHAMBERS DEVI: 1.0000 | Freq: Two times a day (BID) | 1 refills | Status: DC

## 2023-09-01 NOTE — Progress Notes (Addendum)
@Patient  ID: Linda [name], female    DOB: 06/03/1946, 77 y.o.   MRN: [id number]  Chief Complaint  Patient presents with   Consult    Was in the hospital back in July for SOB Breathing has improved some since being on o2, dry cough, and congestion-in throat     Referring provider: Jeoffrey Massed, MD  HPI:   77 y.o. woman with chronic right hypoxemic respiratory failure and asbestosis and pleural plaques whom we are seeing for evaluation of chronic cough.  Discharge summary 06/2023 reviewed.  Multiple pulmonary notes during hospitalization reviewed.  Hospitalized 06/2023 with worsening breathing.  Diagnosed with asthma exacerbation.  Has course of antibiotics with sinusitis.  Steroids as well.  Slowly improved.  CT scan noted to have pleural plaques with some pleural thickening.  She states he had a chronic cough for a couple months prior to hospitalization.  Now present for 4+ months.  Dry.  Feels like a lot of congestion in the chest.  But cannot bring things up.  Feels like he needs it frequently.  On Breztri.  Helps a little bit but wears off during the day.  Currently on prednisone course.  Does not feel that it helped at all.  Questionaires / Pulmonary Flowsheets:   ACT:  Asthma Control Test ACT Total Score  02/28/2020  1:00 PM 17    MMRC:     No data to display          [facility]:      No data to display          Tests:   FENO:  Lab Results  Component Value Date   NITRICOXIDE TEST WILL BE CREDITED 01/30/2013    PFT:    Latest Ref Rng & Units 04/25/2014   11:44 AM  PFT Results  FVC-Pre L 1.26   FVC-Predicted Pre % 41   Pre FEV1/FVC % % 85   FEV1-Pre L 1.07   FEV1-Predicted Pre % 45   Personally reviewed, spirometry indicative of severe restriction versus air trapping, no fixed obstruction  WALK:     08/16/2013   12:38 PM  SIX MIN WALK  Supplimental Oxygen during Test? (L/min) No  Tech Comments: Pt stopped after 1st lap d/t walking  instabaility.     Imaging: Personally as per EMR discussion this note DG Chest 2 View  Result Date: 08/28/2023 CLINICAL DATA:  SOB, slow to improve acute asthma exacerbation EXAM: CHEST - 2 VIEW COMPARISON:  July 2024 FINDINGS: The cardiomediastinal silhouette is within normal limits. No pleural effusion. No pneumothorax. Similar appearance of bilateral calcified pleural plaques without new acute focal consolidative process in the lungs. Similar appearance of multiple compression deformities of the mid to lower thoracic spine. IMPRESSION: No acute focal pulmonary process. Similar appearance of bilateral calcified pleural plaques. Electronically Signed   By: Olive Bass M.D.   On: 08/28/2023 14:52    Lab Results: Personally reviewed CBC    Component Value Date/Time   WBC 10.6 (H) 08/19/2023 1027   RBC 4.34 08/19/2023 1027   HGB 12.4 08/19/2023 1027   HGB 13.6 06/05/2015 1315   HCT 38.5 08/19/2023 1027   HCT 34.4 01/02/2016 0610   HCT 40.8 06/05/2015 1315   PLT 223.0 08/19/2023 1027   PLT 242 06/05/2015 1315   MCV 88.7 08/19/2023 1027   MCV 92 06/05/2015 1315   MCH 28.3 07/16/2023 0411   MCHC 32.3 08/19/2023 1027   RDW 14.8 08/19/2023 1027   RDW  13.5 06/05/2015 1315   LYMPHSABS 2.5 08/19/2023 1027   LYMPHSABS 2.4 06/05/2015 1315   MONOABS 0.9 08/19/2023 1027   EOSABS 0.1 08/19/2023 1027   EOSABS 0.0 06/05/2015 1315   BASOSABS 0.0 08/19/2023 1027   BASOSABS 0.0 06/05/2015 1315    BMET    Component Value Date/Time   NA 141 08/19/2023 1027   NA 141 03/26/2023 1413   NA 141 06/05/2015 1315   K 4.3 08/19/2023 1027   K 3.9 06/05/2015 1315   CL 104 08/19/2023 1027   CL 103 06/05/2015 1315   CO2 31 08/19/2023 1027   CO2 28 06/05/2015 1315   GLUCOSE 103 (H) 08/19/2023 1027   GLUCOSE 108 06/05/2015 1315   BUN 23 08/19/2023 1027   BUN 18 03/26/2023 1413   BUN 14 06/05/2015 1315   CREATININE 0.78 08/19/2023 1027   CREATININE 0.87 02/03/2017 1523   CALCIUM 8.6 08/19/2023  1027   CALCIUM 9.0 06/05/2015 1315   GFRNONAA >60 07/16/2023 0411   GFRNONAA 67 08/28/2016 1413   GFRAA >60 06/18/2020 0444   GFRAA 77 08/28/2016 1413    BNP    Component Value Date/Time   BNP 83.4 07/06/2023 1324   BNP 19.9 10/27/2015 1602    ProBNP    Component Value Date/Time   PROBNP 143.0 (H) 08/19/2023 1027    Specialty Problems       Pulmonary Problems   Pleural plaque with presence of asbestos   Atelectasis   OSA (obstructive sleep apnea)   Amianthosis (HCC)   [name] (HCC)   Epistaxis   Chronic respiratory failure with hypoxia (HCC)   Restrictive lung disease   COPD (chronic obstructive pulmonary disease) (HCC)   Pulmonary asbestosis (HCC)   Acute on chronic respiratory failure (HCC)   Vocal cord dysfunction   Subacute cough   Acute respiratory failure with hypoxia (HCC)   Acute bacterial sinusitis   Moderate persistent asthma with acute exacerbation   Acute non-recurrent sphenoidal sinusitis    Allergies  Allergen Reactions   Aripiprazole Rash and Other (See Comments)    Parkinsonism, also   Penicillins Shortness Of Breath, Itching, Swelling, Rash and Other (See Comments)    Tolerated Cefepime in ED   Bactrim [Sulfamethoxazole-Trimethoprim] Rash    Immunization History  Administered Date(s) Administered   Fluad Quad(high Dose 65+) 09/17/2019, 09/29/2020, 10/11/2021   Fluad Trivalent(High Dose 65+) 08/19/2023   Influenza Split 08/23/2012   Influenza, High Dose Seasonal PF 10/01/2016, 09/04/2018   Influenza,inj,Quad PF,6+ Mos 09/28/2013, 10/05/2014   Pneumococcal Conjugate-13 05/03/2015   Pneumococcal Polysaccharide-23 03/24/2011    Past Medical History:  Diagnosis Date   Acute upper GI bleed 06/2014   while pt taking coumadin, plavix, and meloxicam---despite being told not to take coumadin.   Anginal pain (HCC)    Nonobstructive CAD 2014; however, her cardiologist put her on a statin for this and NOT for hyperlipidemia per pt report.   Atyp CP 08/2017 at card f/u, plan for myoc perf imaging.   Anxiety    panic attacks   BPPV (benign paroxysmal positional vertigo) 12/16/2012   CAD (coronary artery disease)    Nonobstructive ([facility])   Chronic diastolic CHF (congestive heart failure) (HCC)    dry wt as of 11/06/16 is 168 lbs.   Chronic lower back pain    DDD (degenerative disc disease)    lumbar and cervical.    Diverticular disease    Fibromyalgia    Patient states dx was around her late 105s but  she had sx's for years prior to this.   H/O hiatal hernia    History of pneumonia    hospitalized 12/2011, 02/2013, and 07/2013 [facility]) for this   HTN (hypertension)    Renal artery dopplers 04/2013 neg for stenosis.   Hypervitaminosis D 09/27/2019   over-supplemented.  Stopped vit D and plan recheck 2 mo.   Idiopathic angio-edema-urticaria 16109; 2021   Angioedema component was very minimal.  2021->Dr. Bobbitt (allergist) eval.   Insomnia    Iron deficiency anemia    Hematologist in [place], [place] did extensive w/u; no cause found; failed oral supplement;; gets fairly regular (q67m or so) IV iron infusions (Venofer -iron sucrose- 200mg  with procrit.  "for 14 yr I've been getting blood work q month & getting infusions prn" (07/12/2013).  Dr. Myna Hidalgo locally, iron infusions done, EPO deficiency dx'd   Migraine syndrome    "not as often anymore; used to be ~ q wk" (07/12/2013)   Mixed incontinence urge and stress    Nephrolithiasis    "passed all on my own or they are still in there" (07/12/2013)   Neuroleptic induced parkinsonism (HCC) 2018   Dr. Arbutus Leas, neuro, saw her 11/24/17 and recommended d/c of abilify as first step.  D/c'd abilify and pt got complete recovery.   Oropharyngeal dysphagia    swallowing study speech path 05/2020. Gastric bx's showed gastritis, h pylori NEG   OSA on CPAP    prior to move to [place]--had another sleep study 10/2015 w/pulm Dr. Su Monks.   Osteoarthritis    "severe; progressing fast" (07/12/2013); multiple  joints-not surgical candidate for TKR (03/2015).  Triamcinolon knee injections by Dr. Hermelinda Medicus 12/2017.   Pernicious anemia 08/24/2014   Pleural plaque with presence of asbestos 07/22/2013   Pulmonary embolism (HCC) 07/2013   Dx at [facility] with very small peripheral upper lobe pe 07/2013: pt took coumadin for about 8-9 mo   Pyelonephritis    "several times over the last 30 yr" (07/12/2013)   RBBB (right bundle branch block)    Recurrent major depression (HCC)    Recurrent UTI    hx of hospitalization for pyelonephritis; started abx prophylaxis 06/2015   Syncope    Hypotensive; ED visit--Dr. Sharyn Lull did Cath--nonobstructive CAD, EF 55-60%.  In retrospect, suspect pt rx med misuse/polypharmacy   Vertebral compression fracture (HCC)    Acute T12 on 02/27/22 (fall).  multiple old thoracic-->neurosurg to do MRI as of 05/2022    Tobacco History: Social History   Tobacco Use  Smoking Status Never  Smokeless Tobacco Never  Tobacco Comments   never used tobacco   Counseling given: Not Answered Tobacco comments: never used tobacco   Continue to not smoke  Outpatient Encounter Medications as of 09/01/2023  Medication Sig   albuterol (PROVENTIL) (2.5 MG/3ML) 0.083% nebulizer solution Take 3 mLs (2.5 mg total) by nebulization every 6 (six) hours as needed for wheezing or shortness of breath.   albuterol (VENTOLIN HFA) 108 (90 Base) MCG/ACT inhaler Inhale 1-2 puffs into the lungs every 6 (six) hours as needed for wheezing or shortness of breath. (Patient taking differently: Inhale 2 puffs into the lungs every 4 (four) hours as needed for wheezing or shortness of breath.)   ALPRAZolam (XANAX) 0.5 MG tablet TAKE ONE TABLET BY MOUTH THREE TIMES DAILY (Patient taking differently: Take 0.5 mg by mouth 3 (three) times daily.)   amLODipine (NORVASC) 5 MG tablet TAKE ONE TABLET BY MOUTH DAILY   aspirin 81 MG tablet Take 81 mg by mouth  at bedtime.   benzonatate (TESSALON) 100 MG capsule Take 100 mg by mouth 2  (two) times daily as needed for cough.   BREZTRI AEROSPHERE 160-9-4.8 MCG/ACT AERO Inhale 2 puffs into the lungs 2 (two) times daily.   buprenorphine (BUTRANS) 5 MCG/HR PTWK Place 1 patch onto the skin once a week.   diclofenac Sodium (VOLTAREN) 1 % GEL Apply 2 g topically 4 (four) times daily. 4 times a day to both knees (Patient taking differently: Apply 2 g topically 4 (four) times daily as needed (for pain- to affected areas).)   DULoxetine (CYMBALTA) 60 MG capsule Take 1 capsule (60 mg total) by mouth daily. (Patient taking differently: Take 60 mg by mouth at bedtime.)   EPINEPHrine 0.3 mg/0.3 mL IJ SOAJ injection Inject 0.3 mg into the muscle as needed for anaphylaxis.   fluconazole (DIFLUCAN) 150 MG tablet Take 1 tablet (150 mg total) by mouth daily.   fluticasone (FLONASE) 50 MCG/ACT nasal spray Place 1 spray into both nostrils at bedtime.   furosemide (LASIX) 20 MG tablet Take 1 tablet (20 mg total) by mouth daily.   ipratropium (ATROVENT) 0.03 % nasal spray Place 2 sprays into both nostrils every 12 (twelve) hours as needed (allergies).   isosorbide mononitrate (IMDUR) 30 MG 24 hr tablet Take 2 tablets (60 mg total) by mouth in the morning and at bedtime.   lamoTRIgine (LAMICTAL) 200 MG tablet TAKE ONE TABLET BY MOUTH DAILY   methocarbamol (ROBAXIN) 500 MG tablet Take 1 tablet (500 mg total) by mouth every 8 (eight) hours as needed for muscle spasms.   metoprolol succinate (TOPROL-XL) 100 MG 24 hr tablet take ONE tab daily, take additional 1/2 tab as needed FOR FOR BLOOD PRESSURE > 160/90 (Patient taking differently: Take 50-100 mg by mouth See admin instructions. Take 100 mg by mouth once a day and an additional 50 mg as needed for a B/P greater than 160/90)   Multiple Vitamin (MULTIVITAMIN WITH MINERALS) TABS tablet Take 1 tablet by mouth daily with breakfast.   oxyCODONE-acetaminophen (PERCOCET) 10-325 MG tablet Take 1 tablet by mouth every 6 (six) hours as needed for pain.   OXYGEN  Inhale 3 L/min into the lungs continuous.   pantoprazole (PROTONIX) 40 MG tablet Take 1 tablet (40 mg total) by mouth 2 (two) times daily.   predniSONE (DELTASONE) 10 MG tablet 3 tabs po every day x 5d, then 2 tabs po every day x 5d, then 1 tab po every day x 5d   rOPINIRole (REQUIP) 0.5 MG tablet TAKE ONE TABLET BY MOUTH AT BEDTIME   rosuvastatin (CRESTOR) 40 MG tablet Take 1 tablet (40 mg total) by mouth every evening.   solifenacin (VESICARE) 10 MG tablet Take 1 tablet (10 mg total) by mouth daily.   Spacer/Aero-Holding Chambers DEVI 1 puff by Does not apply route 2 (two) times daily.   traZODone (DESYREL) 50 MG tablet TAKE 2-4 TABLETS BY MOUTH AT BEDTIME AS NEEDED INSOMNIA   gabapentin (NEURONTIN) 100 MG capsule Take 1 capsule (100 mg total) by mouth 2 (two) times daily.   loratadine (CLARITIN) 10 MG tablet Take 1 tablet (10 mg total) by mouth daily as needed for allergies or rhinitis.   montelukast (SINGULAIR) 10 MG tablet Take 1 tablet (10 mg total) by mouth at bedtime.   nitroGLYCERIN (NITROSTAT) 0.4 MG SL tablet Place 1 tablet (0.4 mg total) under the tongue every 5 (five) minutes as needed for chest pain (x 3 doses). (Patient not taking: Reported on 09/01/2023)  No facility-administered encounter medications on file as of 09/01/2023.     Review of Systems  Review of Systems  No chest pain with exertion.  No orthopnea or PND.  Comprehensive review of systems otherwise negative. Physical Exam  BP 118/62 (BP Location: Left Arm, Patient Position: Sitting, Cuff Size: Normal)   Pulse 62   Ht 5\' 4"  (1.626 m)   Wt 159 lb 9.6 oz (72.4 kg)   SpO2 94%   BMI 27.40 kg/m   Wt Readings from Last 5 Encounters:  09/01/23 159 lb 9.6 oz (72.4 kg)  08/19/23 158 lb 3.2 oz (71.8 kg)  08/06/23 155 lb (70.3 kg)  08/05/23 155 lb (70.3 kg)  07/29/23 148 lb 9.6 oz (67.4 kg)    BMI Readings from Last 5 Encounters:  09/01/23 27.40 kg/m  08/19/23 27.15 kg/m  08/06/23 26.61 kg/m  08/05/23 26.61  kg/m  07/29/23 25.51 kg/m     Physical Exam General: Sitting in chair, no acute distress Eyes: EOMI, no icterus Neck: Supple, no JVP Pulmonary: Clear, normal work of breathing on 1 L via POC, frequent spasmodic coughs especially with breathing Cardiovascular: Warm, regular rate and rhythm Abdomen: Nondistended, sounds present MSK: No synovitis, no joint effusion Neuro: No focal deficits, alert Psych: Normal mood, full affect  Assessment & Plan:   Chronic hypoxemic respiratory failure: Likely multifactorial related to asbestosis, congestive heart failure and intermittent fluid overload.  Oxygen desaturated to 87% with ambulation today, increased to 95% on 2 L via POC.  New order for POC today.  Chronic cough suspected due to asthma: Present now for 4 months.  Uncertain etiology.  Spasmodic cough.  High suspicion for asthma.  Not much improved with Breztri.  Continue Breztri with addition of spacer.  If not effective would switch to all nebulized therapy.  Prednisone not very effective at all.  Denies GERD symptoms.  Denies nasal congestion postnasal drip.  Paperwork for [place] to be mailed given failure of prednisone and triple inhaled therapy.  Small supply of Hycodan cough syrup to be taken only at night to help get relief and sleep.  Pleural plaques due to asbestosis: With calcification of some plaques.  Some pleural thickening.  PET scan for further evaluation.  If positive will need biopsy, thoracic surgery referral.  Explained in detail today.   Return in about 2 months (around 11/01/2023).   Karren Burly, MD 09/01/2023   This appointment required 45 minutes of patient care (this includes precharting, chart review, review of results, face-to-face care, etc.).

## 2023-09-01 NOTE — Patient Instructions (Signed)
It is nice to meet you  I ordered a PET scan for further evaluation of the asbestosis and pleural plaques  Continue Breztri, use with a spacer.  I will send an order for this.  Will walk today and see if you qualify for a portable oxygen concentrator via insurance.  If so we will send an order to [employer].  Please send me a message in a couple of weeks and if cough is still very bad despite using the spacer and Breztri, I will order nebulizer medicines to replace the Breztri.  Return to clinic in 2 months or sooner as needed with Dr. Judeth Horn

## 2023-09-02 DIAGNOSIS — M6281 Muscle weakness (generalized): Secondary | ICD-10-CM | POA: Diagnosis not present

## 2023-09-03 ENCOUNTER — Ambulatory Visit (INDEPENDENT_AMBULATORY_CARE_PROVIDER_SITE_OTHER): Payer: Medicare Other | Admitting: Family Medicine

## 2023-09-03 ENCOUNTER — Encounter: Payer: Self-pay | Admitting: Family Medicine

## 2023-09-03 DIAGNOSIS — M6281 Muscle weakness (generalized): Secondary | ICD-10-CM | POA: Diagnosis not present

## 2023-09-03 DIAGNOSIS — G894 Chronic pain syndrome: Secondary | ICD-10-CM | POA: Diagnosis not present

## 2023-09-03 DIAGNOSIS — J9611 Chronic respiratory failure with hypoxia: Secondary | ICD-10-CM

## 2023-09-03 DIAGNOSIS — R2689 Other abnormalities of gait and mobility: Secondary | ICD-10-CM | POA: Diagnosis not present

## 2023-09-03 MED ORDER — LORATADINE 10 MG PO TABS: 10.0000 mg | ORAL_TABLET | Freq: Every day | ORAL | 0 refills | Status: DC | PRN

## 2023-09-03 MED ORDER — MONTELUKAST SODIUM 10 MG PO TABS: 10.0000 mg | ORAL_TABLET | Freq: Every day | ORAL | 3 refills | Status: DC

## 2023-09-03 MED ORDER — GABAPENTIN 300 MG PO CAPS: 300.0000 mg | ORAL_CAPSULE | Freq: Two times a day (BID) | ORAL | 1 refills | Status: DC

## 2023-09-03 MED ORDER — PREDNISONE 2.5 MG PO TABS: ORAL_TABLET | ORAL | 0 refills | Status: DC

## 2023-09-03 NOTE — Progress Notes (Signed)
OFFICE VISIT  09/20/2023  CC:  Chief Complaint  Patient presents with   Follow-up    2 week follow up. She was prescribed meds in the hospital and wants to see if she needs to continue them. She is currently on 3 liters of O2    Patient is a 77 y.o. female who presents for 2-week follow-up asthma exacerbation in the setting of chronic hypoxic respiratory failure. A/P as of last visit: "#1 acute asthma exacerbation superimposed on chronic hypoxic respiratory failure and chronic diastolic heart failure.   Slow to resolve. Check CBC, BMET, BNP, chest x-ray. Extend prednisone: 30 mg daily x 5 days, 20 mg daily x 5 days, 10 mg daily x 5 days.   #2 burning tongue syndrome. Although I see no evidence of thrush, she says nystatin suspension has helped some in the past.  Her pharmacy has this on backorder. Will do Diflucan 150 daily x 10 days."  INTERIM HX: Doing okay overall.  She saw Dr. Judeth Horn in pulmonology yesterday for follow-up. Looks like they are pursuing Tezspire. Also doing PET scan to further evaluate her pleural plaques + mediastinal and hilar adenopathy due to asbestosis.  If PET scan positive she will need biopsy, cardiothoracic surgery referral. Dr. Judeth Horn plans to see her again in 2 months.  3L 02.   Past Medical History:  Diagnosis Date   Acute upper GI bleed 06/2014   while pt taking coumadin, plavix, and meloxicam---despite being told not to take coumadin.   Anginal pain (HCC)    Nonobstructive CAD 2014; however, her cardiologist put her on a statin for this and NOT for hyperlipidemia per pt report.  Atyp CP 08/2017 at card f/u, plan for myoc perf imaging.   Anxiety    panic attacks   BPPV (benign paroxysmal positional vertigo) 12/16/2012   CAD (coronary artery disease)    Nonobstructive ([facility])   Chronic diastolic CHF (congestive heart failure) (HCC)    dry wt as of 11/06/16 is 168 lbs.   Chronic lower back pain    DDD (degenerative disc disease)     lumbar and cervical.    Diverticular disease    Fibromyalgia    Patient states dx was around her late 44s but she had sx's for years prior to this.   H/O hiatal hernia    History of pneumonia    hospitalized 12/2011, 02/2013, and 07/2013 [facility]) for this   HTN (hypertension)    Renal artery dopplers 04/2013 neg for stenosis.   Hypervitaminosis D 09/27/2019   over-supplemented.  Stopped vit D and plan recheck 2 mo.   Idiopathic angio-edema-urticaria 25366; 2021   Angioedema component was very minimal.  2021->Dr. Bobbitt (allergist) eval.   Insomnia    Iron deficiency anemia    Hematologist in [place], [place] did extensive w/u; no cause found; failed oral supplement;; gets fairly regular (q88m or so) IV iron infusions (Venofer -iron sucrose- 200mg  with procrit.  "for 14 yr I've been getting blood work q month & getting infusions prn" (07/12/2013).  Dr. Myna Hidalgo locally, iron infusions done, EPO deficiency dx'd   Migraine syndrome    "not as often anymore; used to be ~ q wk" (07/12/2013)   Mixed incontinence urge and stress    Nephrolithiasis    "passed all on my own or they are still in there" (07/12/2013)   Neuroleptic induced parkinsonism (HCC) 2018   Dr. Arbutus Leas, neuro, saw her 11/24/17 and recommended d/c of abilify as first step.  D/c'd abilify  and pt got complete recovery.   Oropharyngeal dysphagia    swallowing study speech path 05/2020. Gastric bx's showed gastritis, h pylori NEG   OSA on CPAP    prior to move to [place]--had another sleep study 10/2015 w/pulm Dr. Su Monks.   Osteoarthritis    "severe; progressing fast" (07/12/2013); multiple joints-not surgical candidate for TKR (03/2015).  Triamcinolon knee injections by Dr. Hermelinda Medicus 12/2017.   Pernicious anemia 08/24/2014   Pleural plaque with presence of asbestos 07/22/2013   Pulmonary embolism (HCC) 07/2013   Dx at [facility] with very small peripheral upper lobe pe 07/2013: pt took coumadin for about 8-9 mo   Pyelonephritis    "several times over the  last 30 yr" (07/12/2013)   RBBB (right bundle branch block)    Recurrent major depression (HCC)    Recurrent UTI    hx of hospitalization for pyelonephritis; started abx prophylaxis 06/2015   Syncope    Hypotensive; ED visit--Dr. Sharyn Lull did Cath--nonobstructive CAD, EF 55-60%.  In retrospect, suspect pt rx med misuse/polypharmacy   Vertebral compression fracture (HCC)    Acute T12 on 02/27/22 (fall).  multiple old thoracic-->neurosurg to do MRI as of 05/2022    Past Surgical History:  Procedure Laterality Date   APPENDECTOMY  1960   AXILLARY SURGERY Left 1978   Multiple "lump" in armpit per pt   BIOPSY  06/17/2020   Gastric bx->gastritis, h pylori neg.  Procedure: BIOPSY;  Surgeon: Jeani Hawking, MD;  Location: [facility];  Service: Endoscopy;;   CARDIAC CATHETERIZATION  01/2013   nonobstructive CAD, EF 55-60%   CARDIOVASCULAR STRESS TEST  02/22/2015   Low risk myocard perf imaging; wall motion normal, normal EF   CARDIOVASCULAR STRESS TEST     09/2017 myo perf low risk   carotid duplex doppler  10/21/2017   01/2023 1-30% bilat-->rpt 1 yr   COCCYX REMOVAL  1972   CT CTA CORONARY W/CA SCORE W/CM &/OR WO/CM     04/02/23 96%'tile calcium score, nonobst CAD-->crestor increased to 40   DEXA  06/05/2017   T-score -3.1   DILATION AND CURETTAGE OF UTERUS  ? 1970's   ESOPHAGOGASTRODUODENOSCOPY N/A 07/19/2014   Gastritis found + in the setting of supratherapeutic INR, +plavix, + meloxicam.   ESOPHAGOGASTRODUODENOSCOPY (EGD) WITH PROPOFOL N/A 06/17/2020   NO stricture or other prob to explain pt's dysphagia, dilation was done anyway.  Gastric bx's-->gastritis, h pylori neg. Procedure: ESOPHAGOGASTRODUODENOSCOPY (EGD) WITH PROPOFOL;  Surgeon: Jeani Hawking, MD;  Location: [facility];  Service: Endoscopy;  Laterality: N/A;   EYE SURGERY Left 2012-2013   "injections for ~ 1 yr; don't really know what for" (07/12/2013)   HEEL SPUR SURGERY Left 2008   kidney stone removal Right    KNEE SURGERY   2005   LEFT HEART CATHETERIZATION WITH CORONARY ANGIOGRAM N/A 01/30/2013   Procedure: LEFT HEART CATHETERIZATION WITH CORONARY ANGIOGRAM;  Surgeon: Robynn Pane, MD;  Location: [facility];  Service: Cardiovascular;  Laterality: N/A;   MALONEY DILATION  06/17/2020   Procedure: Elease Hashimoto DILATION;  Surgeon: Jeani Hawking, MD;  Location: [facility];  Service: Endoscopy;;   nasolaryngoscopy     06/2023 during hosp for resp failure-->normal   PLANTAR FASCIA RELEASE Left 2008   SPIROMETRY  04/25/2014   In hosp for acute asthma/COPD flare: mixed obstructive and restrictive lung disease. The FEV1 is severely reduced at 45% predicted.  FEV1 signif decreased compared to prior spirometry 07/23/13.   TENDON RELEASE  1996   Right forearm and hand  TOTAL ABDOMINAL HYSTERECTOMY  1974   TRANSTHORACIC ECHOCARDIOGRAM  01/2013; 04/2014;08/2015; 09/2017   2014--NORMAL.  2015--focal basal septal hypertrophy, EF 55-60%, grade I diast dysfxn, mild LAE.  08/2015 EF 55-60%, nl LV syst fxn, grade I DD, valves wnl. 10/21/17: EF 65-70%, grd I DD, o/w normal. 02/17/22 EF 70-75%, hyperdynamic LV fxn, grd I DD.    Outpatient Medications Prior to Visit  Medication Sig Dispense Refill   albuterol (PROVENTIL) (2.5 MG/3ML) 0.083% nebulizer solution Take 3 mLs (2.5 mg total) by nebulization every 6 (six) hours as needed for wheezing or shortness of breath. 75 mL 1   albuterol (VENTOLIN HFA) 108 (90 Base) MCG/ACT inhaler Inhale 1-2 puffs into the lungs every 6 (six) hours as needed for wheezing or shortness of breath. (Patient taking differently: Inhale 2 puffs into the lungs every 4 (four) hours as needed for wheezing or shortness of breath.) 18 g 5   ALPRAZolam (XANAX) 0.5 MG tablet TAKE ONE TABLET BY MOUTH THREE TIMES DAILY (Patient taking differently: Take 0.5 mg by mouth 3 (three) times daily.) 90 tablet 5   amLODipine (NORVASC) 5 MG tablet TAKE ONE TABLET BY MOUTH DAILY 90 tablet 3   aspirin 81 MG tablet Take 81 mg by mouth at  bedtime.     benzonatate (TESSALON) 100 MG capsule Take 100 mg by mouth 2 (two) times daily as needed for cough.     BREZTRI AEROSPHERE 160-9-4.8 MCG/ACT AERO Inhale 2 puffs into the lungs 2 (two) times daily. 10.7 g 0   buprenorphine (BUTRANS) 5 MCG/HR PTWK Place 1 patch onto the skin once a week. 4 patch 2   diclofenac Sodium (VOLTAREN) 1 % GEL Apply 2 g topically 4 (four) times daily. 4 times a day to both knees (Patient taking differently: Apply 2 g topically 4 (four) times daily as needed (for pain- to affected areas).) 1000 each 4   DULoxetine (CYMBALTA) 60 MG capsule Take 1 capsule (60 mg total) by mouth daily. (Patient taking differently: Take 60 mg by mouth at bedtime.) 90 capsule 1   EPINEPHrine 0.3 mg/0.3 mL IJ SOAJ injection Inject 0.3 mg into the muscle as needed for anaphylaxis. 1 each 1   fluconazole (DIFLUCAN) 150 MG tablet Take 1 tablet (150 mg total) by mouth daily. 10 tablet 0   fluticasone (FLONASE) 50 MCG/ACT nasal spray Place 1 spray into both nostrils at bedtime. 16 g 0   furosemide (LASIX) 20 MG tablet Take 1 tablet (20 mg total) by mouth daily. 30 tablet 0   HYDROcodone bit-homatropine (HYCODAN) 5-1.5 MG/5ML syrup Take 5 mLs by mouth at bedtime as needed for cough. 240 mL 0   ipratropium (ATROVENT) 0.03 % nasal spray Place 2 sprays into both nostrils every 12 (twelve) hours as needed (allergies). 30 mL 5   isosorbide mononitrate (IMDUR) 30 MG 24 hr tablet Take 2 tablets (60 mg total) by mouth in the morning and at bedtime. 270 tablet 2   lamoTRIgine (LAMICTAL) 200 MG tablet TAKE ONE TABLET BY MOUTH DAILY 90 tablet 1   methocarbamol (ROBAXIN) 500 MG tablet Take 1 tablet (500 mg total) by mouth every 8 (eight) hours as needed for muscle spasms. 60 tablet 2   metoprolol succinate (TOPROL-XL) 100 MG 24 hr tablet take ONE tab daily, take additional 1/2 tab as needed FOR FOR BLOOD PRESSURE > 160/90 (Patient taking differently: Take 50-100 mg by mouth See admin instructions. Take 100  mg by mouth once a day and an additional 50 mg as needed  for a B/P greater than 160/90) 135 tablet 1   Multiple Vitamin (MULTIVITAMIN WITH MINERALS) TABS tablet Take 1 tablet by mouth daily with breakfast.     oxyCODONE-acetaminophen (PERCOCET) 10-325 MG tablet Take 1 tablet by mouth every 6 (six) hours as needed for pain. 120 tablet 0   OXYGEN Inhale 3 L/min into the lungs continuous.     pantoprazole (PROTONIX) 40 MG tablet Take 1 tablet (40 mg total) by mouth 2 (two) times daily. 180 tablet 1   rOPINIRole (REQUIP) 0.5 MG tablet TAKE ONE TABLET BY MOUTH AT BEDTIME 90 tablet 1   rosuvastatin (CRESTOR) 40 MG tablet Take 1 tablet (40 mg total) by mouth every evening. 90 tablet 3   solifenacin (VESICARE) 10 MG tablet Take 1 tablet (10 mg total) by mouth daily. 30 tablet 2   Spacer/Aero-Holding Chambers DEVI 1 puff by Does not apply route 2 (two) times daily. 1 each 1   traZODone (DESYREL) 50 MG tablet TAKE 2-4 TABLETS BY MOUTH AT BEDTIME AS NEEDED INSOMNIA 120 tablet 2   predniSONE (DELTASONE) 10 MG tablet 3 tabs po every day x 5d, then 2 tabs po every day x 5d, then 1 tab po every day x 5d 30 tablet 0   nitroGLYCERIN (NITROSTAT) 0.4 MG SL tablet Place 1 tablet (0.4 mg total) under the tongue every 5 (five) minutes as needed for chest pain (x 3 doses). (Patient not taking: Reported on 09/01/2023) 25 tablet 11   gabapentin (NEURONTIN) 100 MG capsule Take 1 capsule (100 mg total) by mouth 2 (two) times daily. 60 capsule 0   loratadine (CLARITIN) 10 MG tablet Take 1 tablet (10 mg total) by mouth daily as needed for allergies or rhinitis. 30 tablet 0   montelukast (SINGULAIR) 10 MG tablet Take 1 tablet (10 mg total) by mouth at bedtime. 30 tablet 0   No facility-administered medications prior to visit.    Allergies  Allergen Reactions   Aripiprazole Rash and Other (See Comments)    Parkinsonism, also   Penicillins Shortness Of Breath, Itching, Swelling, Rash and Other (See Comments)    Tolerated  Cefepime in ED   Bactrim [Sulfamethoxazole-Trimethoprim] Rash    Review of Systems As per HPI  PE:    09/03/2023    1:09 PM 09/01/2023    3:28 PM 08/19/2023    9:46 AM  Vitals with BMI  Height  5\' 4"    Weight 162 lbs 10 oz 159 lbs 10 oz 158 lbs 3 oz  BMI 27.9 27.38 27.14  Systolic 106 118 213  Diastolic 60 62 72  Pulse 67 62 68  02 94% 3L oxygen today  Physical Exam  Gen: Alert, well appearing.  Patient is oriented to person, place, time, and situation. LUNGS: CTA bilat, some cough at end exp, dec aeration diffusely on exhalation, prolonged exp phase.   LABS:  Last CBC Lab Results  Component Value Date   WBC 10.6 (H) 08/19/2023   HGB 12.4 08/19/2023   HCT 38.5 08/19/2023   MCV 88.7 08/19/2023   MCH 28.3 07/16/2023   RDW 14.8 08/19/2023   PLT 223.0 08/19/2023   Last metabolic panel Lab Results  Component Value Date   GLUCOSE 103 (H) 08/19/2023   NA 141 08/19/2023   K 4.3 08/19/2023   CL 104 08/19/2023   CO2 31 08/19/2023   BUN 23 08/19/2023   CREATININE 0.78 08/19/2023   GFR 73.26 08/19/2023   CALCIUM 8.6 08/19/2023   PHOS 4.0 02/27/2022  PROT 7.0 07/16/2023   ALBUMIN 3.5 07/16/2023   BILITOT 0.5 07/16/2023   ALKPHOS 58 07/16/2023   AST 22 07/16/2023   ALT 29 07/16/2023   ANIONGAP 9 07/16/2023   BNP    Component Value Date/Time   BNP 83.4 07/06/2023 1324   BNP 19.9 10/27/2015 1602    ProBNP    Component Value Date/Time   PROBNP 143.0 (H) 08/19/2023 1027   IMPRESSION AND PLAN:  1) Acute asthma exacerbation superimposed on chronic hypoxic respiratory failure and chronic diastolic heart failure.  Ween down prednisone more--->5mg  every day x 7d then 2.5mg  every day x 7d, then stop. Cont 3L 24/7 oxygen. Pulm pursuing [facility]sp[place]. Also doing PET scan to further evaluate her pleural plaques + mediastinal and hilar adenopathy due to asbestosis.  If PET scan positive she will need biopsy, cardiothoracic surgery referral. Dr. Judeth Horn plans to see her  again in 2 months  2) Chronic pain, increase gabapentin to 300mg  bid.  An After Visit Summary was printed and given to the patient.  FOLLOW UP: Return in about 6 weeks (around 10/15/2023) for routine chronic illness f/u.  Signed:  Santiago Bumpers, MD           09/20/2023

## 2023-09-04 DIAGNOSIS — M6281 Muscle weakness (generalized): Secondary | ICD-10-CM | POA: Diagnosis not present

## 2023-09-04 NOTE — Addendum Note (Signed)
Addended byVilma Meckel on: 09/04/2023 11:09 AM   Modules accepted: Orders

## 2023-09-05 DIAGNOSIS — R490 Dysphonia: Secondary | ICD-10-CM | POA: Diagnosis not present

## 2023-09-05 DIAGNOSIS — R41841 Cognitive communication deficit: Secondary | ICD-10-CM | POA: Diagnosis not present

## 2023-09-05 NOTE — Addendum Note (Signed)
Addended byVilma Meckel on: 09/05/2023 10:03 AM   Modules accepted: Orders

## 2023-09-08 DIAGNOSIS — M6281 Muscle weakness (generalized): Secondary | ICD-10-CM | POA: Diagnosis not present

## 2023-09-08 DIAGNOSIS — R2689 Other abnormalities of gait and mobility: Secondary | ICD-10-CM | POA: Diagnosis not present

## 2023-09-09 DIAGNOSIS — M6281 Muscle weakness (generalized): Secondary | ICD-10-CM | POA: Diagnosis not present

## 2023-09-09 DIAGNOSIS — R41841 Cognitive communication deficit: Secondary | ICD-10-CM | POA: Diagnosis not present

## 2023-09-09 DIAGNOSIS — R490 Dysphonia: Secondary | ICD-10-CM | POA: Diagnosis not present

## 2023-09-10 DIAGNOSIS — R2689 Other abnormalities of gait and mobility: Secondary | ICD-10-CM | POA: Diagnosis not present

## 2023-09-10 DIAGNOSIS — M6281 Muscle weakness (generalized): Secondary | ICD-10-CM | POA: Diagnosis not present

## 2023-09-12 DIAGNOSIS — R490 Dysphonia: Secondary | ICD-10-CM | POA: Diagnosis not present

## 2023-09-12 DIAGNOSIS — R41841 Cognitive communication deficit: Secondary | ICD-10-CM | POA: Diagnosis not present

## 2023-09-17 DIAGNOSIS — R2689 Other abnormalities of gait and mobility: Secondary | ICD-10-CM | POA: Diagnosis not present

## 2023-09-17 DIAGNOSIS — M6281 Muscle weakness (generalized): Secondary | ICD-10-CM | POA: Diagnosis not present

## 2023-09-19 DIAGNOSIS — M6281 Muscle weakness (generalized): Secondary | ICD-10-CM | POA: Diagnosis not present

## 2023-09-19 DIAGNOSIS — R490 Dysphonia: Secondary | ICD-10-CM | POA: Diagnosis not present

## 2023-09-19 DIAGNOSIS — R2689 Other abnormalities of gait and mobility: Secondary | ICD-10-CM | POA: Diagnosis not present

## 2023-09-19 DIAGNOSIS — R41841 Cognitive communication deficit: Secondary | ICD-10-CM | POA: Diagnosis not present

## 2023-09-22 ENCOUNTER — Other Ambulatory Visit: Payer: Self-pay | Admitting: Family Medicine

## 2023-09-22 ENCOUNTER — Telehealth: Payer: Self-pay | Admitting: Pulmonary Disease

## 2023-09-22 NOTE — Telephone Encounter (Signed)
Lm x1 for the patient.

## 2023-09-22 NOTE — Telephone Encounter (Signed)
Pt calling in bc she needs a rescue inhaler, neb solution as well as a Breztri inhaler. PT Is extremely SOB and coughing extremely   Pharmacy: [facility]

## 2023-09-23 ENCOUNTER — Encounter: Payer: Self-pay | Admitting: Adult Health

## 2023-09-23 ENCOUNTER — Ambulatory Visit (INDEPENDENT_AMBULATORY_CARE_PROVIDER_SITE_OTHER): Payer: Medicare Other | Admitting: Adult Health

## 2023-09-23 VITALS — BP 128/70 | HR 79 | Temp 98.1°F | Ht 64.0 in | Wt 163.4 lb

## 2023-09-23 DIAGNOSIS — R490 Dysphonia: Secondary | ICD-10-CM | POA: Diagnosis not present

## 2023-09-23 DIAGNOSIS — J61 Pneumoconiosis due to asbestos and other mineral fibers: Secondary | ICD-10-CM

## 2023-09-23 DIAGNOSIS — J9611 Chronic respiratory failure with hypoxia: Secondary | ICD-10-CM | POA: Diagnosis not present

## 2023-09-23 DIAGNOSIS — R41841 Cognitive communication deficit: Secondary | ICD-10-CM | POA: Diagnosis not present

## 2023-09-23 DIAGNOSIS — J4541 Moderate persistent asthma with (acute) exacerbation: Secondary | ICD-10-CM

## 2023-09-23 DIAGNOSIS — M6281 Muscle weakness (generalized): Secondary | ICD-10-CM | POA: Diagnosis not present

## 2023-09-23 MED ORDER — ALBUTEROL SULFATE (2.5 MG/3ML) 0.083% IN NEBU: 2.5000 mg | INHALATION_SOLUTION | Freq: Four times a day (QID) | RESPIRATORY_TRACT | 3 refills | Status: DC | PRN

## 2023-09-23 MED ORDER — ALBUTEROL SULFATE HFA 108 (90 BASE) MCG/ACT IN AERS: 1.0000 | INHALATION_SPRAY | Freq: Four times a day (QID) | RESPIRATORY_TRACT | 5 refills | Status: DC | PRN

## 2023-09-23 MED ORDER — BREZTRI AEROSPHERE 160-9-4.8 MCG/ACT IN AERO: 2.0000 | INHALATION_SPRAY | Freq: Two times a day (BID) | RESPIRATORY_TRACT | Status: DC

## 2023-09-23 MED ORDER — DOXYCYCLINE HYCLATE 100 MG PO TABS: 100.0000 mg | ORAL_TABLET | Freq: Two times a day (BID) | ORAL | 0 refills | Status: DC

## 2023-09-23 MED ORDER — BREZTRI AEROSPHERE 160-9-4.8 MCG/ACT IN AERO: 2.0000 | INHALATION_SPRAY | Freq: Two times a day (BID) | RESPIRATORY_TRACT | 11 refills | Status: DC

## 2023-09-23 MED ORDER — BENZONATATE 200 MG PO CAPS: 200.0000 mg | ORAL_CAPSULE | Freq: Three times a day (TID) | ORAL | 3 refills | Status: DC | PRN

## 2023-09-23 NOTE — Assessment & Plan Note (Signed)
Pulmonary asbestosis with pleural plaques and nodularity.  PET scan is pending.

## 2023-09-23 NOTE — Assessment & Plan Note (Signed)
Slow to resolve asthmatic bronchitic exacerbation-likely with superimposed recurrent sinusitis/bronchitis.  Begin doxycycline x 7 days.  Get her to finish up her prednisone taper as recommended.  Will continue on cough control regimen.  Patient is to restart Breztri twice daily.  May use albuterol or nebs as needed  Plan  Patient Instructions  Finish prednisone  Begin Doxycycline 100mg  Twice daily  for 1 week , take with food  Begin Delsym 2 tsp Twice daily  for cough As needed   Tessalon Three times a day  for cough As needed   Continue on Oxygen 3l/m rest and 4l/m with activity  PET scan as planned  Restart Breztri 2 puffs Twice daily, rinse after use.  Albuterol inhaler or neb As needed   Follow up in 4-6 weeks with Dr. Judeth Horn and As needed   Please contact office for sooner follow up if symptoms do not improve or worsen or seek emergency care

## 2023-09-23 NOTE — Progress Notes (Signed)
@Patient  ID: Linda [name], female    DOB: 24-Nov-1946, 77 y.o.   MRN: [id number]  Chief Complaint  Patient presents with   Acute Visit    Referring provider: Jeoffrey Massed, MD  HPI: 77 year old female never smoker followed for asthma,  chronic hypoxic respiratory failure with underlying asbestosis with pleural plaques and a chronic cough  TEST/EVENTS :  CT sinus 06/2023 Paranasal sinus disease with small air-fluid level in the right sphenoid sinus, consistent with acute sinusitis.  CT chest 06/2023  Heavy bilateral pleural calcifications with associated linear and nodular pleural and subpleural thickening. Most prominent nodular thickening is seen in the left lower lobe. Findings are consistent with asbestos related pleural disease or posttraumatic changes. Malignancy is not excluded. Further evaluation with PET-CT may be considered when clinically appropriate.. Borderline enlarged mediastinal and hilar lymph nodes, stable.  PET scan 09/2023 Pending    09/23/2023 Follow up : Asthma, O2 RF , Pulmonary [name]  Patient presents for a follow-up visit.  Patient has underlying moderate persistent asthma, pulmonary asbestosis with extensive pulmonary plaques and nodularity, chronic respiratory failure on oxygen.  Patient was admitted in July for acute on chronic hypoxic respiratory failure with asthma exacerbation and acute sinusitis.  Was treated with a 10-day course of IV Rocephin.  Aggressive bronchodilator regimen, steroids and cough control regimen.  Patient says since discharge she was starting to feel some better.  But over the last 2 weeks feels that she is gotten worse.  She ran out of [place] about 1 week ago.  She has had more cough congestion and wheezing.  Is also blowing out green nasal discharge.  She remains on oxygen 3 L at rest and 4 L with activity. He has chronic allergies is on Singulair and Flonase.  Patient is currently not using anything for cough control.  She  does have an upcoming PET scan next week to follow increased nodularity on CT scan.  As above she has pulmonary asbestosis with pleural plaque and nodularity.  Her husband passed away from asbestosis and mesothelioma.  Patient is a never smoker.  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  She was recently started on a slow prednisone taper.  She has a few days left of this.  Allergies  Allergen Reactions   Aripiprazole Rash and Other (See Comments)    Parkinsonism, also   Penicillins Shortness Of Breath, Itching, Swelling, Rash and Other (See Comments)    Tolerated Cefepime in ED   Bactrim [Sulfamethoxazole-Trimethoprim] Rash    Immunization History  Administered Date(s) Administered   Fluad Quad(high Dose 65+) 09/17/2019, 09/29/2020, 10/11/2021   Fluad Trivalent(High Dose 65+) 08/19/2023   Influenza Split 08/23/2012   Influenza, High Dose Seasonal PF 10/01/2016, 09/04/2018   Influenza,inj,Quad PF,6+ Mos 09/28/2013, 10/05/2014   Pneumococcal Conjugate-13 05/03/2015   Pneumococcal Polysaccharide-23 03/24/2011    Past Medical History:  Diagnosis Date   Acute upper GI bleed 06/2014   while pt taking coumadin, plavix, and meloxicam---despite being told not to take coumadin.   Anginal pain (HCC)    Nonobstructive CAD 2014; however, her cardiologist put her on a statin for this and NOT for hyperlipidemia per pt report.  Atyp CP 08/2017 at card f/u, plan for myoc perf imaging.   Anxiety    panic attacks   BPPV (benign paroxysmal positional vertigo) 12/16/2012   CAD (coronary artery disease)    Nonobstructive ([facility])   Chronic diastolic  CHF (congestive heart failure) (HCC)    dry wt as of 11/06/16 is 168 lbs.   Chronic lower back pain    DDD (degenerative disc disease)    lumbar and cervical.    Diverticular disease    Fibromyalgia    Patient states dx was around her late 46s but she had sx's for years prior to this.    H/O hiatal hernia    History of pneumonia    hospitalized 12/2011, 02/2013, and 07/2013 [facility]) for this   HTN (hypertension)    Renal artery dopplers 04/2013 neg for stenosis.   Hypervitaminosis D 09/27/2019   over-supplemented.  Stopped vit D and plan recheck 2 mo.   Idiopathic angio-edema-urticaria 65784; 2021   Angioedema component was very minimal.  2021->Dr. Bobbitt (allergist) eval.   Insomnia    Iron deficiency anemia    Hematologist in [place], [place] did extensive w/u; no cause found; failed oral supplement;; gets fairly regular (q13m or so) IV iron infusions (Venofer -iron sucrose- 200mg  with procrit.  "for 14 yr I've been getting blood work q month & getting infusions prn" (07/12/2013).  Dr. Myna Hidalgo locally, iron infusions done, EPO deficiency dx'd   Migraine syndrome    "not as often anymore; used to be ~ q wk" (07/12/2013)   Mixed incontinence urge and stress    Nephrolithiasis    "passed all on my own or they are still in there" (07/12/2013)   Neuroleptic induced parkinsonism (HCC) 2018   Dr. Arbutus Leas, neuro, saw her 11/24/17 and recommended d/c of abilify as first step.  D/c'd abilify and pt got complete recovery.   Oropharyngeal dysphagia    swallowing study speech path 05/2020. Gastric bx's showed gastritis, h pylori NEG   OSA on CPAP    prior to move to --had another sleep study 10/2015 w/pulm Dr. Su Monks.   Osteoarthritis    "severe; progressing fast" (07/12/2013); multiple joints-not surgical candidate for TKR (03/2015).  Triamcinolon knee injections by Dr. Hermelinda Medicus 12/2017.   Pernicious anemia 08/24/2014   Pleural plaque with presence of asbestos 07/22/2013   Pulmonary embolism (HCC) 07/2013   Dx at [facility] with very small peripheral upper lobe pe 07/2013: pt took coumadin for about 8-9 mo   Pyelonephritis    "several times over the last 30 yr" (07/12/2013)   RBBB (right bundle branch block)    Recurrent major depression (HCC)    Recurrent UTI    hx of hospitalization for  pyelonephritis; started abx prophylaxis 06/2015   Syncope    Hypotensive; ED visit--Dr. Sharyn Lull did Cath--nonobstructive CAD, EF 55-60%.  In retrospect, suspect pt rx med misuse/polypharmacy   Vertebral compression fracture (HCC)    Acute T12 on 02/27/22 (fall).  multiple old thoracic-->neurosurg to do MRI as of 05/2022    Tobacco History: Social History   Tobacco Use  Smoking Status Never  Smokeless Tobacco Never  Tobacco Comments   never used tobacco   Counseling given: Not Answered Tobacco comments: never used tobacco   Outpatient Medications Prior to Visit  Medication Sig Dispense Refill   ALPRAZolam (XANAX) 0.5 MG tablet TAKE ONE TABLET BY MOUTH THREE TIMES DAILY 90 tablet 5   amLODipine (NORVASC) 5 MG tablet TAKE ONE TABLET BY MOUTH DAILY 90 tablet 3   aspirin 81 MG tablet Take 81 mg by mouth at bedtime.     buprenorphine (BUTRANS) 5 MCG/HR PTWK Place 1 patch onto the skin once a week. 4 patch 2   diclofenac Sodium (VOLTAREN) 1 % GEL  Apply 2 g topically 4 (four) times daily. 4 times a day to both knees (Patient taking differently: Apply 2 g topically 4 (four) times daily as needed (for pain- to affected areas).) 1000 each 4   DULoxetine (CYMBALTA) 60 MG capsule Take 1 capsule (60 mg total) by mouth daily. (Patient taking differently: Take 60 mg by mouth at bedtime.) 90 capsule 1   EPINEPHrine 0.3 mg/0.3 mL IJ SOAJ injection Inject 0.3 mg into the muscle as needed for anaphylaxis. 1 each 1   gabapentin (NEURONTIN) 300 MG capsule Take 1 capsule (300 mg total) by mouth 2 (two) times daily. 60 capsule 1   HYDROcodone bit-homatropine (HYCODAN) 5-1.5 MG/5ML syrup Take 5 mLs by mouth at bedtime as needed for cough. 240 mL 0   ipratropium (ATROVENT) 0.03 % nasal spray Place 2 sprays into both nostrils every 12 (twelve) hours as needed (allergies). 30 mL 5   isosorbide mononitrate (IMDUR) 30 MG 24 hr tablet Take 2 tablets (60 mg total) by mouth in the morning and at bedtime. 270 tablet 2    lamoTRIgine (LAMICTAL) 200 MG tablet TAKE ONE TABLET BY MOUTH DAILY 90 tablet 1   metoprolol succinate (TOPROL-XL) 100 MG 24 hr tablet take ONE tab daily, take additional 1/2 tab as needed FOR FOR BLOOD PRESSURE > 160/90 (Patient taking differently: Take 50-100 mg by mouth See admin instructions. Take 100 mg by mouth once a day and an additional 50 mg as needed for a B/P greater than 160/90) 135 tablet 1   montelukast (SINGULAIR) 10 MG tablet Take 1 tablet (10 mg total) by mouth at bedtime. 90 tablet 3   Multiple Vitamin (MULTIVITAMIN WITH MINERALS) TABS tablet Take 1 tablet by mouth daily with breakfast.     nitroGLYCERIN (NITROSTAT) 0.4 MG SL tablet Place 1 tablet (0.4 mg total) under the tongue every 5 (five) minutes as needed for chest pain (x 3 doses). 25 tablet 11   oxyCODONE-acetaminophen (PERCOCET) 10-325 MG tablet Take 1 tablet by mouth every 6 (six) hours as needed for pain. 120 tablet 0   OXYGEN Inhale 3 L/min into the lungs continuous.     pantoprazole (PROTONIX) 40 MG tablet Take 1 tablet (40 mg total) by mouth 2 (two) times daily. 180 tablet 1   predniSONE (DELTASONE) 2.5 MG tablet 2 tabs po every day x 7 days then 1 tab po every day x 7 days 21 tablet 0   rOPINIRole (REQUIP) 0.5 MG tablet TAKE ONE TABLET BY MOUTH AT BEDTIME (Patient taking differently: Take 0.5 mg by mouth at bedtime as needed. TAKE ONE TABLET BY MOUTH AT BEDTIME) 90 tablet 1   rosuvastatin (CRESTOR) 40 MG tablet Take 1 tablet (40 mg total) by mouth every evening. 90 tablet 3   solifenacin (VESICARE) 10 MG tablet Take 1 tablet (10 mg total) by mouth daily. 30 tablet 2   Spacer/Aero-Holding Chambers DEVI 1 puff by Does not apply route 2 (two) times daily. 1 each 1   traZODone (DESYREL) 50 MG tablet TAKE 2-4 TABLETS BY MOUTH AT BEDTIME AS NEEDED INSOMNIA 120 tablet 2   albuterol (PROVENTIL) (2.5 MG/3ML) 0.083% nebulizer solution Take 3 mLs (2.5 mg total) by nebulization every 6 (six) hours as needed for wheezing or  shortness of breath. 75 mL 1   albuterol (VENTOLIN HFA) 108 (90 Base) MCG/ACT inhaler Inhale 1-2 puffs into the lungs every 6 (six) hours as needed for wheezing or shortness of breath. (Patient taking differently: Inhale 2 puffs into the lungs every  4 (four) hours as needed for wheezing or shortness of breath.) 18 g 5   benzonatate (TESSALON) 100 MG capsule Take 100 mg by mouth 2 (two) times daily as needed for cough.     BREZTRI AEROSPHERE 160-9-4.8 MCG/ACT AERO Inhale 2 puffs into the lungs 2 (two) times daily. 10.7 g 0   fluticasone (FLONASE) 50 MCG/ACT nasal spray Place 1 spray into both nostrils at bedtime. (Patient not taking: Reported on 09/23/2023) 16 g 0   furosemide (LASIX) 20 MG tablet Take 1 tablet (20 mg total) by mouth daily. (Patient not taking: Reported on 09/23/2023) 30 tablet 0   loratadine (CLARITIN) 10 MG tablet Take 1 tablet (10 mg total) by mouth daily as needed for allergies or rhinitis. (Patient not taking: Reported on 09/23/2023) 90 tablet 0   methocarbamol (ROBAXIN) 500 MG tablet Take 1 tablet (500 mg total) by mouth every 8 (eight) hours as needed for muscle spasms. (Patient not taking: Reported on 09/23/2023) 60 tablet 2   fluconazole (DIFLUCAN) 150 MG tablet Take 1 tablet (150 mg total) by mouth daily. (Patient not taking: Reported on 09/23/2023) 10 tablet 0   No facility-administered medications prior to visit.     Review of Systems:   Constitutional:   No  weight loss, night sweats,  Fevers, chills, +fatigue, or  lassitude.  HEENT:   No headaches,  Difficulty swallowing,  Tooth/dental problems, or  Sore throat,                No sneezing, itching, ear ache, + nasal congestion, post nasal drip,   CV:  No chest pain,  Orthopnea, PND, swelling in lower extremities, anasarca, dizziness, palpitations, syncope.   GI  No heartburn, indigestion, abdominal pain, nausea, vomiting, diarrhea, change in bowel habits, loss of appetite, bloody stools.   Resp:   No chest wall  deformity  Skin: no rash or lesions.  GU: no dysuria, change in color of urine, no urgency or frequency.  No flank pain, no hematuria   MS:  No joint pain or swelling.  No decreased range of motion.  No back pain.    Physical Exam  BP 128/70 (BP Location: Left Arm, Patient Position: Sitting, Cuff Size: Normal)   Pulse 79   Temp 98.1 F (36.7 C) (Oral)   Ht 5\' 4"  (1.626 m)   Wt 163 lb 6.4 oz (74.1 kg)   SpO2 92%   BMI 28.05 kg/m   GEN: A/Ox3; pleasant , NAD, well nourished    HEENT:  [place]/AT,   NOSE-clear, THROAT-clear, no lesions, no postnasal drip or exudate noted.   NECK:  Supple w/ fair ROM; no JVD; normal carotid impulses w/o bruits; no thyromegaly or nodules palpated; no lymphadenopathy.    RESP  Clear  P & A; w/o, wheezes/ rales/ or rhonchi. no accessory muscle use, no dullness to percussion  CARD:  RRR, no m/r/g, no peripheral edema, pulses intact, no cyanosis or clubbing.  GI:   Soft & nt; nml bowel sounds; no organomegaly or masses detected.   Musco: Warm bil, no deformities or joint swelling noted.   Neuro: alert, no focal deficits noted.    Skin: Warm, no lesions or rashes    Lab Results:   BMET     Imaging: DG Chest 2 View  Result Date: 08/28/2023 CLINICAL DATA:  SOB, slow to improve acute asthma exacerbation EXAM: CHEST - 2 VIEW COMPARISON:  July 2024 FINDINGS: The cardiomediastinal silhouette is within normal limits. No pleural effusion. No  pneumothorax. Similar appearance of bilateral calcified pleural plaques without new acute focal consolidative process in the lungs. Similar appearance of multiple compression deformities of the mid to lower thoracic spine. IMPRESSION: No acute focal pulmonary process. Similar appearance of bilateral calcified pleural plaques. Electronically Signed   By: Olive Bass M.D.   On: 08/28/2023 14:52    Triamcinolone Acetonide (ZILRETTA) intra-articular injection 64 mg     Date Action Dose Route User   08/06/2023 1526  Given 64 mg Intra-articular (Other) Silas Sacramento T, CMA          Latest Ref Rng & Units 04/25/2014   11:44 AM  PFT Results  FVC-Pre L 1.26   FVC-Predicted Pre % 41   Pre FEV1/FVC % % 85   FEV1-Pre L 1.07   FEV1-Predicted Pre % 45     Lab Results  Component Value Date   NITRICOXIDE TEST WILL BE CREDITED 01/30/2013        Assessment & Plan:   Moderate persistent asthma with acute exacerbation Slow to resolve asthmatic bronchitic exacerbation-likely with superimposed recurrent sinusitis/bronchitis.  Begin doxycycline x 7 days.  Get her to finish up her prednisone taper as recommended.  Will continue on cough control regimen.  Patient is to restart Breztri twice daily.  May use albuterol or nebs as needed  Plan  Patient Instructions  Finish prednisone  Begin Doxycycline 100mg  Twice daily  for 1 week , take with food  Begin Delsym 2 tsp Twice daily  for cough As needed   Tessalon Three times a day  for cough As needed   Continue on Oxygen 3l/m rest and 4l/m with activity  PET scan as planned  Restart Breztri 2 puffs Twice daily, rinse after use.  Albuterol inhaler or neb As needed   Follow up in 4-6 weeks with Dr. Judeth Horn and As needed   Please contact office for sooner follow up if symptoms do not improve or worsen or seek emergency care      Pulmonary asbestosis [facility]) Pulmonary asbestosis with pleural plaques and nodularity.  PET scan is pending.  Chronic respiratory failure with hypoxia (HCC) Continue on oxygen to maintain O2 saturations greater than 88 to 90%     [name], NP 09/23/2023

## 2023-09-23 NOTE — Telephone Encounter (Signed)
Pt is being seen in Office with TP, nfn

## 2023-09-23 NOTE — Patient Instructions (Addendum)
Finish prednisone  Begin Doxycycline 100mg  Twice daily  for 1 week , take with food  Begin Delsym 2 tsp Twice daily  for cough As needed   Tessalon Three times a day  for cough As needed   Continue on Oxygen 3l/m rest and 4l/m with activity  PET scan as planned  Restart Breztri 2 puffs Twice daily, rinse after use.  Albuterol inhaler or neb As needed   Follow up in 4-6 weeks with Dr. Judeth Horn and As needed   Please contact office for sooner follow up if symptoms do not improve or worsen or seek emergency care

## 2023-09-23 NOTE — Assessment & Plan Note (Addendum)
Continue on oxygen to maintain O2 saturations greater than 88 to 90%. 

## 2023-09-24 DIAGNOSIS — R2689 Other abnormalities of gait and mobility: Secondary | ICD-10-CM | POA: Diagnosis not present

## 2023-09-24 DIAGNOSIS — M6281 Muscle weakness (generalized): Secondary | ICD-10-CM | POA: Diagnosis not present

## 2023-09-25 DIAGNOSIS — M6281 Muscle weakness (generalized): Secondary | ICD-10-CM | POA: Diagnosis not present

## 2023-09-26 DIAGNOSIS — M6281 Muscle weakness (generalized): Secondary | ICD-10-CM | POA: Diagnosis not present

## 2023-09-26 DIAGNOSIS — R2689 Other abnormalities of gait and mobility: Secondary | ICD-10-CM | POA: Diagnosis not present

## 2023-09-29 ENCOUNTER — Ambulatory Visit (HOSPITAL_COMMUNITY): Admission: RE | Admit: 2023-09-29 | Payer: Medicare Other | Source: Ambulatory Visit

## 2023-09-29 DIAGNOSIS — R490 Dysphonia: Secondary | ICD-10-CM | POA: Diagnosis not present

## 2023-09-29 DIAGNOSIS — R41841 Cognitive communication deficit: Secondary | ICD-10-CM | POA: Diagnosis not present

## 2023-09-30 DIAGNOSIS — M6281 Muscle weakness (generalized): Secondary | ICD-10-CM | POA: Diagnosis not present

## 2023-10-01 DIAGNOSIS — R2689 Other abnormalities of gait and mobility: Secondary | ICD-10-CM | POA: Diagnosis not present

## 2023-10-01 DIAGNOSIS — M6281 Muscle weakness (generalized): Secondary | ICD-10-CM | POA: Diagnosis not present

## 2023-10-02 DIAGNOSIS — M6281 Muscle weakness (generalized): Secondary | ICD-10-CM | POA: Diagnosis not present

## 2023-10-03 DIAGNOSIS — R490 Dysphonia: Secondary | ICD-10-CM | POA: Diagnosis not present

## 2023-10-03 DIAGNOSIS — R41841 Cognitive communication deficit: Secondary | ICD-10-CM | POA: Diagnosis not present

## 2023-10-06 DIAGNOSIS — R2689 Other abnormalities of gait and mobility: Secondary | ICD-10-CM | POA: Diagnosis not present

## 2023-10-06 DIAGNOSIS — M6281 Muscle weakness (generalized): Secondary | ICD-10-CM | POA: Diagnosis not present

## 2023-10-07 ENCOUNTER — Encounter: Payer: Self-pay | Admitting: Registered Nurse

## 2023-10-07 ENCOUNTER — Encounter: Payer: Medicare Other | Attending: Physical Medicine & Rehabilitation | Admitting: Registered Nurse

## 2023-10-07 VITALS — BP 94/60 | HR 74 | Ht 64.0 in | Wt 164.8 lb

## 2023-10-07 DIAGNOSIS — G894 Chronic pain syndrome: Secondary | ICD-10-CM | POA: Insufficient documentation

## 2023-10-07 DIAGNOSIS — M1711 Unilateral primary osteoarthritis, right knee: Secondary | ICD-10-CM | POA: Insufficient documentation

## 2023-10-07 DIAGNOSIS — M47816 Spondylosis without myelopathy or radiculopathy, lumbar region: Secondary | ICD-10-CM | POA: Diagnosis not present

## 2023-10-07 DIAGNOSIS — Z79899 Other long term (current) drug therapy: Secondary | ICD-10-CM | POA: Insufficient documentation

## 2023-10-07 DIAGNOSIS — M546 Pain in thoracic spine: Secondary | ICD-10-CM | POA: Insufficient documentation

## 2023-10-07 DIAGNOSIS — Z5181 Encounter for therapeutic drug level monitoring: Secondary | ICD-10-CM | POA: Diagnosis not present

## 2023-10-07 DIAGNOSIS — M5416 Radiculopathy, lumbar region: Secondary | ICD-10-CM | POA: Insufficient documentation

## 2023-10-07 DIAGNOSIS — G8929 Other chronic pain: Secondary | ICD-10-CM | POA: Insufficient documentation

## 2023-10-07 DIAGNOSIS — M1712 Unilateral primary osteoarthritis, left knee: Secondary | ICD-10-CM | POA: Diagnosis not present

## 2023-10-07 DIAGNOSIS — R490 Dysphonia: Secondary | ICD-10-CM | POA: Diagnosis not present

## 2023-10-07 DIAGNOSIS — R41841 Cognitive communication deficit: Secondary | ICD-10-CM | POA: Diagnosis not present

## 2023-10-07 MED ORDER — OXYCODONE-ACETAMINOPHEN 10-325 MG PO TABS: 1.0000 | ORAL_TABLET | Freq: Four times a day (QID) | ORAL | 0 refills | Status: DC | PRN

## 2023-10-07 MED ORDER — OXYCODONE-ACETAMINOPHEN 10-325 MG PO TABS
1.0000 | ORAL_TABLET | Freq: Four times a day (QID) | ORAL | 0 refills | Status: DC | PRN
Start: 2023-10-07 — End: 2023-10-07

## 2023-10-07 NOTE — Progress Notes (Signed)
Subjective:    Patient ID: Linda [name], female    DOB: 19-Mar-1946, 77 y.o.   MRN: [id number]  HPI: Linda [name] is a 77 y.o. female who returns for follow up appointment for chronic pain and medication refill. She states her pain is located in her mid- lower back radiating into her bilateral lower extremities. She also reports bilateral knee pain. She rates her pain 8. Her current exercise regime is walking with her walker short distances and receiving physical, occupational and speech therapy two days a week .  Linda [name] Morphine equivalent is 60.00 MME. She is also prescribed Alprazolam by Dr. Milinda Cave .We have discussed the black box warning of using opioids and benzodiazepines. I highlighted the dangers of using these drugs together and discussed the adverse events including respiratory suppression, overdose, cognitive impairment and importance of compliance with current regimen. We will continue to monitor and adjust as indicated.   Oral Swab was Performed today.     Pain Inventory Average Pain 8 Pain Right Now 8 My pain is intermittent, constant, sharp, dull, stabbing, and aching  In the last 24 hours, has pain interfered with the following? General activity 10 Relation with others 9 Enjoyment of life 10 What TIME of day is your pain at its worst? morning , daytime, evening, and night Sleep (in general) Fair  Pain is worse with: walking, bending, and standing Pain improves with: rest, heat/ice, medication, and injections Relief from Meds: 5  Family History  Problem Relation Age of Onset   Arthritis Mother    Kidney disease Mother    Heart disease Father    Stroke Father    Hypertension Father    Diabetes Father    Heart attack Father    Heart attack Paternal Grandmother    Diabetes Sister        one sister   Hypertension Sister    Asthma Sister    Hypertension Brother    Asthma Brother    Asthma Daughter    Multiple sclerosis Son    Social History    Socioeconomic History   Marital status: Widowed    Spouse name: Not on file   Number of children: 2   Years of education: Not on file   Highest education level: Not on file  Occupational History   Occupation: Retired    Comment: Runner, 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  Relocated to [place] 09/2012 to be closer to her son who has MS.   Husband d 2015--mesothelioma.   Occupation: former Runner, broadcasting/film/video.   Education: masters degree level.   No T/A/Ds.   Lives in [place], independent living.   Social Determinants of Health   Financial Resource Strain: Low Risk  (01/01/2023)   Overall Financial Resource Strain (CARDIA)    Difficulty of Paying Living Expenses: Not hard at all  Food Insecurity: No Food Insecurity (07/07/2023)   Hunger Vital Sign    Worried About Running Out of Food in the Last Year: Never true    Ran Out of Food  in the Last Year: Never true  Transportation Needs: No Transportation Needs (07/07/2023)   PRAPARE - Administrator, Civil Service (Medical): No    Lack of Transportation (Non-Medical): No  Physical Activity: Insufficiently Active (01/01/2023)   Exercise Vital Sign    Days of Exercise per Week: 2 days    Minutes of Exercise per Session: 20 min  Stress: No Stress Concern Present (01/01/2023)   [employer] of Occupational Health - Occupational Stress Questionnaire    Feeling of Stress : Not at all  Social Connections: Moderately Isolated (01/01/2023)   Social Connection and Isolation Panel [NHANES]    Frequency of Communication with Friends and Family: Three times a week    Frequency of Social  Gatherings with Friends and Family: More than three times a week    Attends Religious Services: More than 4 times per year    Active Member of Clubs or Organizations: No    Attends Banker Meetings: Never    Marital Status: Widowed   Past Surgical History:  Procedure Laterality Date   APPENDECTOMY  1960   AXILLARY SURGERY Left 1978   Multiple "lump" in armpit per pt   BIOPSY  06/17/2020   Gastric bx->gastritis, h pylori neg.  Procedure: BIOPSY;  Surgeon: Jeani Hawking, MD;  Location: [facility];  Service: Endoscopy;;   CARDIAC CATHETERIZATION  01/2013   nonobstructive CAD, EF 55-60%   CARDIOVASCULAR STRESS TEST  02/22/2015   Low risk myocard perf imaging; wall motion normal, normal EF   CARDIOVASCULAR STRESS TEST     09/2017 myo perf low risk   carotid duplex doppler  10/21/2017   01/2023 1-30% bilat-->rpt 1 yr   COCCYX REMOVAL  1972   CT CTA CORONARY W/CA SCORE W/CM &/OR WO/CM     04/02/23 96%'tile calcium score, nonobst CAD-->crestor increased to 40   DEXA  06/05/2017   T-score -3.1   DILATION AND CURETTAGE OF UTERUS  ? 1970's   ESOPHAGOGASTRODUODENOSCOPY N/A 07/19/2014   Gastritis found + in the setting of supratherapeutic INR, +plavix, + meloxicam.   ESOPHAGOGASTRODUODENOSCOPY (EGD) WITH PROPOFOL N/A 06/17/2020   NO stricture or other prob to explain pt's dysphagia, dilation was done anyway.  Gastric bx's-->gastritis, h pylori neg. Procedure: ESOPHAGOGASTRODUODENOSCOPY (EGD) WITH PROPOFOL;  Surgeon: Jeani Hawking, MD;  Location: [facility];  Service: Endoscopy;  Laterality: N/A;   EYE SURGERY Left 2012-2013   "injections for ~ 1 yr; don't really know what for" (07/12/2013)   HEEL SPUR SURGERY Left 2008   kidney stone removal Right    KNEE SURGERY  2005   LEFT HEART CATHETERIZATION WITH CORONARY ANGIOGRAM N/A 01/30/2013   Procedure: LEFT HEART CATHETERIZATION WITH CORONARY ANGIOGRAM;  Surgeon: Robynn Pane, MD;  Location: [facility];  Service: Cardiovascular;   Laterality: N/A;   MALONEY DILATION  06/17/2020   Procedure: Elease Hashimoto DILATION;  Surgeon: Jeani Hawking, MD;  Location: [facility];  Service: Endoscopy;;   nasolaryngoscopy     06/2023 during hosp for resp failure-->normal   PLANTAR FASCIA RELEASE Left 2008   SPIROMETRY  04/25/2014   In hosp for acute asthma/COPD flare: mixed obstructive and restrictive lung disease. The FEV1 is severely reduced at 45% predicted.  FEV1 signif decreased compared to prior spirometry 07/23/13.   TENDON RELEASE  1996   Right forearm and hand   TOTAL ABDOMINAL HYSTERECTOMY  1974   TRANSTHORACIC ECHOCARDIOGRAM  01/2013; 04/2014;08/2015; 09/2017   2014--NORMAL.  2015--focal basal septal hypertrophy, EF 55-60%, grade I  diast dysfxn, mild LAE.  08/2015 EF 55-60%, nl LV syst fxn, grade I DD, valves wnl. 10/21/17: EF 65-70%, grd I DD, o/w normal. 02/17/22 EF 70-75%, hyperdynamic LV fxn, grd I DD.   Past Surgical History:  Procedure Laterality Date   APPENDECTOMY  1960   AXILLARY SURGERY Left 1978   Multiple "lump" in armpit per pt   BIOPSY  06/17/2020   Gastric bx->gastritis, h pylori neg.  Procedure: BIOPSY;  Surgeon: Jeani Hawking, MD;  [facility];  Service: Endoscopy;;   CARDIAC CATHETERIZATION  01/2013   nonobstructive CAD, EF 55-60%   CARDIOVASCULAR STRESS TEST  02/22/2015   Low risk myocard perf imaging; wall motion normal, normal EF   CARDIOVASCULAR STRESS TEST     09/2017 myo perf low risk   carotid duplex doppler  10/21/2017   01/2023 1-30% bilat-->rpt 1 yr   COCCYX REMOVAL  1972   CT CTA CORONARY W/CA SCORE W/CM &/OR WO/CM     04/02/23 96%'tile calcium score, nonobst CAD-->crestor increased to 40   DEXA  06/05/2017   T-score -3.1   DILATION AND CURETTAGE OF UTERUS  ? 1970's   ESOPHAGOGASTRODUODENOSCOPY N/A 07/19/2014   Gastritis found + in the setting of supratherapeutic INR, +plavix, + meloxicam.   ESOPHAGOGASTRODUODENOSCOPY (EGD) WITH PROPOFOL N/A 06/17/2020   NO stricture or other prob to  explain pt's dysphagia, dilation was done anyway.  Gastric bx's-->gastritis, h pylori neg. Procedure: ESOPHAGOGASTRODUODENOSCOPY (EGD) WITH PROPOFOL;  Surgeon: Jeani Hawking, MD;  [facility];  Service: Endoscopy;  Laterality: N/A;   EYE SURGERY Left 2012-2013   "injections for ~ 1 yr; don't really know what for" (07/12/2013)   HEEL SPUR SURGERY Left 2008   kidney stone removal Right    KNEE SURGERY  2005   LEFT HEART CATHETERIZATION WITH CORONARY ANGIOGRAM N/A 01/30/2013   Procedure: LEFT HEART CATHETERIZATION WITH CORONARY ANGIOGRAM;  Surgeon: Robynn Pane, MD;  [facility];  Service: Cardiovascular;  Laterality: N/A;   MALONEY DILATION  06/17/2020   Procedure: Elease Hashimoto DILATION;  Surgeon: Jeani Hawking, MD;  [facility];  Service: Endoscopy;;   nasolaryngoscopy     06/2023 during hosp for resp failure-->normal   PLANTAR FASCIA RELEASE Left 2008   SPIROMETRY  04/25/2014   In hosp for acute asthma/COPD flare: mixed obstructive and restrictive lung disease. The FEV1 is severely reduced at 45% predicted.  FEV1 signif decreased compared to prior spirometry 07/23/13.   TENDON RELEASE  1996   Right forearm and hand   TOTAL ABDOMINAL HYSTERECTOMY  1974   TRANSTHORACIC ECHOCARDIOGRAM  01/2013; 04/2014;08/2015; 09/2017   2014--NORMAL.  2015--focal basal septal hypertrophy, EF 55-60%, grade I diast dysfxn, mild LAE.  08/2015 EF 55-60%, nl LV syst fxn, grade I DD, valves wnl. 10/21/17: EF 65-70%, grd I DD, o/w normal. 02/17/22 EF 70-75%, hyperdynamic LV fxn, grd I DD.   Past Medical History:  Diagnosis Date   Acute upper GI bleed 06/2014   while pt taking coumadin, plavix, and meloxicam---despite being told not to take coumadin.   Anginal pain (HCC)    Nonobstructive CAD 2014; however, her cardiologist put her on a statin for this and NOT for hyperlipidemia per pt report.  Atyp CP 08/2017 at card f/u, plan for myoc perf imaging.   Anxiety    panic attacks   BPPV (benign  paroxysmal positional vertigo) 12/16/2012   CAD (coronary artery disease)    Nonobstructive ([facility])   Chronic diastolic CHF (congestive heart failure) (HCC)  dry wt as of 11/06/16 is 168 lbs.   Chronic lower back pain    DDD (degenerative disc disease)    lumbar and cervical.    Diverticular disease    Fibromyalgia    Patient states dx was around her late 38s but she had sx's for years prior to this.   H/O hiatal hernia    History of pneumonia    hospitalized 12/2011, 02/2013, and 07/2013 [facility]) for this   HTN (hypertension)    Renal artery dopplers 04/2013 neg for stenosis.   Hypervitaminosis D 09/27/2019   over-supplemented.  Stopped vit D and plan recheck 2 mo.   Idiopathic angio-edema-urticaria 10932; 2021   Angioedema component was very minimal.  2021->Dr. Bobbitt (allergist) eval.   Insomnia    Iron deficiency anemia    Hematologist in [place], [place] did extensive w/u; no cause found; failed oral supplement;; gets fairly regular (q69m or so) IV iron infusions (Venofer -iron sucrose- 200mg  with procrit.  "for 14 yr I've been getting blood work q month & getting infusions prn" (07/12/2013).  Dr. Myna Hidalgo locally, iron infusions done, EPO deficiency dx'd   Migraine syndrome    "not as often anymore; used to be ~ q wk" (07/12/2013)   Mixed incontinence urge and stress    Nephrolithiasis    "passed all on my own or they are still in there" (07/12/2013)   Neuroleptic induced parkinsonism (HCC) 2018   Dr. Arbutus Leas, neuro, saw her 11/24/17 and recommended d/c of abilify as first step.  D/c'd abilify and pt got complete recovery.   Oropharyngeal dysphagia    swallowing study speech path 05/2020. Gastric bx's showed gastritis, h pylori NEG   OSA on CPAP    prior to move to [place]--had another sleep study 10/2015 w/pulm Dr. Su Monks.   Osteoarthritis    "severe; progressing fast" (07/12/2013); multiple joints-not surgical candidate for TKR (03/2015).  Triamcinolon knee injections by Dr. Hermelinda Medicus 12/2017.    Pernicious anemia 08/24/2014   Pleural plaque with presence of asbestos 07/22/2013   Pulmonary embolism (HCC) 07/2013   Dx at [facility] with very small peripheral upper lobe pe 07/2013: pt took coumadin for about 8-9 mo   Pyelonephritis    "several times over the last 30 yr" (07/12/2013)   RBBB (right bundle branch block)    Recurrent major depression (HCC)    Recurrent UTI    hx of hospitalization for pyelonephritis; started abx prophylaxis 06/2015   Syncope    Hypotensive; ED visit--Dr. Sharyn Lull did Cath--nonobstructive CAD, EF 55-60%.  In retrospect, suspect pt rx med misuse/polypharmacy   Vertebral compression fracture (HCC)    Acute T12 on 02/27/22 (fall).  multiple old thoracic-->neurosurg to do MRI as of 05/2022   BP 94/60   Pulse 74   Ht 5\' 4"  (1.626 m)   Wt 164 lb 12.8 oz (74.8 kg)   SpO2 90%   BMI 28.29 kg/m   Opioid Risk Score:   Fall Risk Score:  `1  Depression screen [facility] 2/9     10/07/2023   11:20 AM 08/05/2023    9:55 AM 07/02/2023    9:23 AM 06/11/2023   11:03 AM 05/01/2023    9:54 AM 04/23/2023   10:54 AM 01/01/2023    1:57 PM  Depression screen PHQ 2/9  Decreased Interest 0 2 0 0 3 0 0  Down, Depressed, Hopeless 0 1  0 2 2 2   PHQ - 2 Score 0 3 0 0 5 2 2   Altered sleeping  1   2 0 0  Tired, decreased energy  1   2 1 1   Change in appetite  1   2 0 1  Feeling bad or failure about yourself   1   2 1 1   Trouble concentrating  0   2 0 0  Moving slowly or fidgety/restless  0   1 0 0  Suicidal thoughts  0   0 0 0  PHQ-9 Score  7   16 4 5   Difficult doing work/chores  Somewhat difficult   Very difficult Somewhat difficult Somewhat difficult     Review of Systems  Constitutional: Negative.   HENT: Negative.    Eyes: Negative.   Respiratory: Negative.    Cardiovascular: Negative.   Gastrointestinal: Negative.   Endocrine: Negative.   Genitourinary: Negative.   Musculoskeletal:  Positive for arthralgias, back pain, gait problem, joint swelling and myalgias.  Skin:  Negative.   Allergic/Immunologic: Negative.   Hematological: Negative.   Psychiatric/Behavioral: Negative.    All other systems reviewed and are negative.      Objective:   Physical Exam Vitals and nursing note reviewed.  Constitutional:      Appearance: Normal appearance.  Neck:     Comments: Cervical Paraspinal Tenderness: C-5-C-6  Cardiovascular:     Rate and Rhythm: Normal rate and regular rhythm.     Pulses: Normal pulses.     Heart sounds: Normal heart sounds.  Pulmonary:     Effort: Pulmonary effort is normal.     Breath sounds: Normal breath sounds.     Comments: Continuous Oxygen @ 3 liters Nasal Cannula  Musculoskeletal:     Cervical back: Normal range of motion and neck supple.     Comments: Normal Muscle Bulk and Muscle Testing Reveals:  Upper Extremities: Full ROM and Muscle Strength 5/5 Bilateral AC Joint Tenderness Lumbar Paraspinal Tenderness: L-3-L-5 Lower Extremities: Decreased ROM and Muscle Strength 5/5 Bilateral Lower Extremities Flexion Produces Pain into her Bilateral Patella's Arises from Table slowly using walker for support Antalgic  Gait     Skin:    General: Skin is warm and dry.  Neurological:     Mental Status: She is alert and oriented to person, place, and time.  Psychiatric:        Mood and Affect: Mood normal.        Behavior: Behavior normal.         Assessment & Plan:  Cervicalgia/ Cervical Radiculitis: Continue HEP as Tolerated.  Continue to Monitor. 10/07/2023 Left Pelvic Pain/ Left Hip Pain: No complaints today. Continue HEP as Tolerated. Continue to Monitor. 10/07/2023 3. Functional deficits secondary to Gait disorder:Continue with HEP. 10/07/2023. 4. Chronic Back pain/ Lumbar Radiculitis/fibromyalgia /R>L Knee OA Pain Management: 10/07/2023 Refilled: Oxycodone 10/325 mg one tablet every 6 hours #120.  Second script sent for the following month. Continue Butrans 5 MCG/ Weekly patch.   Continue Voltaren Gel. We will continue  the opioid monitoring program, this consists of regular clinic visits, examinations, urine drug screen, pill counts as well as use of [place] Controlled Substance Reporting system. A 12 month History has been reviewed on the [place] Controlled Substance Reporting System on 10/07/2023 5. Depression with anxiety/Grief reaction/Mood: Continue Xanax, Trazodone and Cymbalta . PCP Following. 10/07/2023 6. [name] with asthma:. Albuterol prn. :Pulmonology Following. 10/07/2023 7.. Bilateral Osteoarthritis of Bilateral Knees: Ms. Tannahill scheduled for  Bilateral Zilretta Injection with Dr Riley Kill, her last one was on 04/09/2023, with good relief noted. .Continue to  Monitor. 10/07/2023 8. Fibromyalgia: Continue Home exercise regimen as tolerated. Continue to Monitor.10/07/2023 9. Bilateral  Greater Trochanteric Bursitis: Continue to  Alternate Ice and Heat Therapy. Continue to Monitor. 10/07/2023. 10. Chronic Bilateral Shoulder Pain:  No complaints  Today. Continue HEP as Tolerated. Continue current medication regimen. Continue to Monitor. 10/07/2023   F/U in 2 months

## 2023-10-08 ENCOUNTER — Encounter (HOSPITAL_COMMUNITY)
Admission: RE | Admit: 2023-10-08 | Discharge: 2023-10-08 | Disposition: A | Discharge status: Home / Self Care | Payer: Medicare Other | Source: Ambulatory Visit | Attending: Pulmonary Disease | Admitting: Pulmonary Disease

## 2023-10-08 DIAGNOSIS — R2689 Other abnormalities of gait and mobility: Secondary | ICD-10-CM | POA: Diagnosis not present

## 2023-10-08 DIAGNOSIS — J920 Pleural plaque with presence of asbestos: Secondary | ICD-10-CM | POA: Diagnosis not present

## 2023-10-08 DIAGNOSIS — R918 Other nonspecific abnormal finding of lung field: Secondary | ICD-10-CM | POA: Diagnosis not present

## 2023-10-08 DIAGNOSIS — R9389 Abnormal findings on diagnostic imaging of other specified body structures: Secondary | ICD-10-CM | POA: Insufficient documentation

## 2023-10-08 DIAGNOSIS — M6281 Muscle weakness (generalized): Secondary | ICD-10-CM | POA: Diagnosis not present

## 2023-10-08 LAB — GLUCOSE, CAPILLARY: Glucose-Capillary: 96 mg/dL (ref 70–99)

## 2023-10-08 MED ORDER — FLUDEOXYGLUCOSE F - 18 (FDG) INJECTION
8.2000 | Freq: Once | INTRAVENOUS | Status: AC
  Administered 2023-10-08: 8.2 via INTRAVENOUS

## 2023-10-09 DIAGNOSIS — M6281 Muscle weakness (generalized): Secondary | ICD-10-CM | POA: Diagnosis not present

## 2023-10-10 DIAGNOSIS — R41841 Cognitive communication deficit: Secondary | ICD-10-CM | POA: Diagnosis not present

## 2023-10-10 DIAGNOSIS — R490 Dysphonia: Secondary | ICD-10-CM | POA: Diagnosis not present

## 2023-10-11 LAB — DRUG TOX MONITOR 1 W/CONF, ORAL FLD
Alprazolam: 1.15 ng/mL — ABNORMAL HIGH (ref <0.50)
Amphetamines: NEGATIVE ng/mL (ref <10)
Barbiturates: NEGATIVE ng/mL (ref <10)
Benzodiazepines: POSITIVE ng/mL — AB (ref <0.50)
Buprenorphine: NEGATIVE ng/mL (ref <0.10)
Chlordiazepoxide: NEGATIVE ng/mL (ref <0.50)
Clonazepam: NEGATIVE ng/mL (ref <0.50)
Cocaine: NEGATIVE ng/mL (ref <5.0)
Codeine: NEGATIVE ng/mL (ref <2.5)
Diazepam: NEGATIVE ng/mL (ref <0.50)
Dihydrocodeine: NEGATIVE ng/mL (ref <2.5)
Fentanyl: NEGATIVE ng/mL (ref <0.10)
Flunitrazepam: NEGATIVE ng/mL (ref <0.50)
Flurazepam: NEGATIVE ng/mL (ref <0.50)
Heroin Metabolite: NEGATIVE ng/mL (ref <1.0)
Hydrocodone: NEGATIVE ng/mL (ref <2.5)
Hydromorphone: NEGATIVE ng/mL (ref <2.5)
Lorazepam: NEGATIVE ng/mL (ref <0.50)
MARIJUANA: NEGATIVE ng/mL (ref <2.5)
MDMA: NEGATIVE ng/mL (ref <10)
Meprobamate: NEGATIVE ng/mL (ref <2.5)
Methadone: NEGATIVE ng/mL (ref <5.0)
Midazolam: NEGATIVE ng/mL (ref <0.50)
Morphine: NEGATIVE ng/mL (ref <2.5)
Nicotine Metabolite: NEGATIVE ng/mL (ref <5.0)
Nordiazepam: NEGATIVE ng/mL (ref <0.50)
Norhydrocodone: NEGATIVE ng/mL (ref <2.5)
Noroxycodone: 31 ng/mL — ABNORMAL HIGH (ref <2.5)
Opiates: POSITIVE ng/mL — AB (ref <2.5)
Oxazepam: NEGATIVE ng/mL (ref <0.50)
Oxycodone: 100.5 ng/mL — ABNORMAL HIGH (ref <2.5)
Oxymorphone: NEGATIVE ng/mL (ref <2.5)
Phencyclidine: NEGATIVE ng/mL (ref <10)
Tapentadol: NEGATIVE ng/mL (ref <5.0)
Temazepam: NEGATIVE ng/mL (ref <0.50)
Tramadol: NEGATIVE ng/mL (ref <5.0)
Triazolam: NEGATIVE ng/mL (ref <0.50)
Zolpidem: NEGATIVE ng/mL (ref <5.0)

## 2023-10-11 LAB — DRUG TOX ALC METAB W/CON, ORAL FLD: Alcohol Metabolite: NEGATIVE ng/mL (ref <25)

## 2023-10-13 DIAGNOSIS — M6281 Muscle weakness (generalized): Secondary | ICD-10-CM | POA: Diagnosis not present

## 2023-10-13 DIAGNOSIS — R2689 Other abnormalities of gait and mobility: Secondary | ICD-10-CM | POA: Diagnosis not present

## 2023-10-14 DIAGNOSIS — M6281 Muscle weakness (generalized): Secondary | ICD-10-CM | POA: Diagnosis not present

## 2023-10-15 ENCOUNTER — Encounter: Payer: Self-pay | Admitting: Family Medicine

## 2023-10-15 ENCOUNTER — Ambulatory Visit: Payer: Medicare Other | Admitting: Family Medicine

## 2023-10-15 VITALS — BP 112/70 | HR 67 | Wt 167.0 lb

## 2023-10-15 DIAGNOSIS — R2689 Other abnormalities of gait and mobility: Secondary | ICD-10-CM | POA: Diagnosis not present

## 2023-10-15 DIAGNOSIS — I10 Essential (primary) hypertension: Secondary | ICD-10-CM

## 2023-10-15 DIAGNOSIS — M6281 Muscle weakness (generalized): Secondary | ICD-10-CM | POA: Diagnosis not present

## 2023-10-15 DIAGNOSIS — J45901 Unspecified asthma with (acute) exacerbation: Secondary | ICD-10-CM

## 2023-10-15 DIAGNOSIS — E7800 Pure hypercholesterolemia, unspecified: Secondary | ICD-10-CM

## 2023-10-15 LAB — LIPID PANEL
Cholesterol: 121 mg/dL (ref 0–200)
HDL: 39.5 mg/dL (ref >39.00)
LDL Cholesterol: 51 mg/dL (ref 0–99)
NonHDL: 81.55
Total CHOL/HDL Ratio: 3
Triglycerides: 152 mg/dL — ABNORMAL HIGH (ref 0.0–149.0)
VLDL: 30.4 mg/dL (ref 0.0–40.0)

## 2023-10-15 LAB — BASIC METABOLIC PANEL
BUN: 16 mg/dL (ref 6–23)
CO2: 31 meq/L (ref 19–32)
Calcium: 9.1 mg/dL (ref 8.4–10.5)
Chloride: 101 meq/L (ref 96–112)
Creatinine, Ser: 0.9 mg/dL (ref 0.40–1.20)
GFR: 61.64 mL/min (ref >60.00)
Glucose, Bld: 96 mg/dL (ref 70–99)
Potassium: 4.1 meq/L (ref 3.5–5.1)
Sodium: 139 meq/L (ref 135–145)

## 2023-10-15 MED ORDER — PREDNISONE 10 MG PO TABS: ORAL_TABLET | ORAL | 0 refills | Status: DC

## 2023-10-15 NOTE — Progress Notes (Signed)
OFFICE VISIT  10/15/2023  CC:  Chief Complaint  Patient presents with   Medical Management of Chronic Issues    Patient is a 77 y.o. female who presents for follow-up labile hypertension, recurrent major depressive disorder resistant to treatment, and GAD.  INTERIM HX:  She had pulmonary follow-up 09/23/2023 at which time she was placed on doxycycline x 7 days.  This was for a slowed to resolve asthmatic bronchitic exacerbation likely with superimposed recurrent sinusitis/bronchitis.  She was to finish up the prednisone taper that she was on at that time. She was instructed to restart Breztri 2 puffs twice daily and to take [employer] and [employer] as needed. PET scan skull base to thigh was done 10/08/23: no results yet. Update: She feels like her cough and wheezing is more significant lately. No fever, no chest pain.  She does feel somewhat short of breath chronically. No lower extremity swelling. No face pain/sinus pressure.  Recurrent major depressive disorder, generalized anxiety disorder-->these have been stable on Lamictal 200 mg a day, Cymbalta 60 mg a day, and Xanax 0.5 mg 3 times daily. She feels stable from a mood standpoint.  Excessive anxiety is still a problem.  Dealing with a lot of pain in her knees, worse even than usual.  She hopes to get a steroid injection in her knees soon.  PMP AWARE reviewed today: most recent rx for alprazolam 0.5 mg was filled 09/22/2023, # 90, rx by me. Most recent gabapentin 300 mg was filled 09/28/2023, #60, prescription by me. The remainder of her controlled substance prescriptions are prescribed by another provider. No red flags.  Past Medical History:  Diagnosis Date   Acute upper GI bleed 06/2014   while pt taking coumadin, plavix, and meloxicam---despite being told not to take coumadin.   Anginal pain (HCC)    Nonobstructive CAD 2014; however, her cardiologist put her on a statin for this and NOT for hyperlipidemia per pt report.   Atyp CP 08/2017 at card f/u, plan for myoc perf imaging.   Anxiety    panic attacks   BPPV (benign paroxysmal positional vertigo) 12/16/2012   CAD (coronary artery disease)    Nonobstructive ([facility])   Chronic diastolic CHF (congestive heart failure) (HCC)    dry wt as of 11/06/16 is 168 lbs.   Chronic lower back pain    DDD (degenerative disc disease)    lumbar and cervical.    Diverticular disease    Fibromyalgia    Patient states dx was around her late 28s but she had sx's for years prior to this.   H/O hiatal hernia    History of pneumonia    hospitalized 12/2011, 02/2013, and 07/2013 [facility]) for this   HTN (hypertension)    Renal artery dopplers 04/2013 neg for stenosis.   Hypervitaminosis D 09/27/2019   over-supplemented.  Stopped vit D and plan recheck 2 mo.   Idiopathic angio-edema-urticaria 09811; 2021   Angioedema component was very minimal.  2021->Dr. Bobbitt (allergist) eval.   Insomnia    Iron deficiency anemia    Hematologist in [place], [place] did extensive w/u; no cause found; failed oral supplement;; gets fairly regular (q68m or so) IV iron infusions (Venofer -iron sucrose- 200mg  with procrit.  "for 14 yr I've been getting blood work q month & getting infusions prn" (07/12/2013).  Dr. Myna Hidalgo locally, iron infusions done, EPO deficiency dx'd   Migraine syndrome    "not as often anymore; used to be ~ q wk" (07/12/2013)  Mixed incontinence urge and stress    Nephrolithiasis    "passed all on my own or they are still in there" (07/12/2013)   Neuroleptic induced parkinsonism (HCC) 2018   Dr. Arbutus Leas, neuro, saw her 11/24/17 and recommended d/c of abilify as first step.  D/c'd abilify and pt got complete recovery.   Oropharyngeal dysphagia    swallowing study speech path 05/2020. Gastric bx's showed gastritis, h pylori NEG   OSA on CPAP    prior to move to [place]--had another sleep study 10/2015 w/pulm Dr. Su Monks.   Osteoarthritis    "severe; progressing fast" (07/12/2013); multiple  joints-not surgical candidate for TKR (03/2015).  Triamcinolon knee injections by Dr. Hermelinda Medicus 12/2017.   Pernicious anemia 08/24/2014   Pleural plaque with presence of asbestos 07/22/2013   Pulmonary embolism (HCC) 07/2013   Dx at [facility] with very small peripheral upper lobe pe 07/2013: pt took coumadin for about 8-9 mo   Pyelonephritis    "several times over the last 30 yr" (07/12/2013)   RBBB (right bundle branch block)    Recurrent major depression (HCC)    Recurrent UTI    hx of hospitalization for pyelonephritis; started abx prophylaxis 06/2015   Syncope    Hypotensive; ED visit--Dr. Sharyn Lull did Cath--nonobstructive CAD, EF 55-60%.  In retrospect, suspect pt rx med misuse/polypharmacy   Vertebral compression fracture (HCC)    Acute T12 on 02/27/22 (fall).  multiple old thoracic-->neurosurg to do MRI as of 05/2022    Past Surgical History:  Procedure Laterality Date   APPENDECTOMY  1960   AXILLARY SURGERY Left 1978   Multiple "lump" in armpit per pt   BIOPSY  06/17/2020   Gastric bx->gastritis, h pylori neg.  Procedure: BIOPSY;  Surgeon: Jeani Hawking, MD;  Location: [facility];  Service: Endoscopy;;   CARDIAC CATHETERIZATION  01/2013   nonobstructive CAD, EF 55-60%   CARDIOVASCULAR STRESS TEST  02/22/2015   Low risk myocard perf imaging; wall motion normal, normal EF   CARDIOVASCULAR STRESS TEST     09/2017 myo perf low risk   carotid duplex doppler  10/21/2017   01/2023 1-30% bilat-->rpt 1 yr   COCCYX REMOVAL  1972   CT CTA CORONARY W/CA SCORE W/CM &/OR WO/CM     04/02/23 96%'tile calcium score, nonobst CAD-->crestor increased to 40   DEXA  06/05/2017   T-score -3.1   DILATION AND CURETTAGE OF UTERUS  ? 1970's   ESOPHAGOGASTRODUODENOSCOPY N/A 07/19/2014   Gastritis found + in the setting of supratherapeutic INR, +plavix, + meloxicam.   ESOPHAGOGASTRODUODENOSCOPY (EGD) WITH PROPOFOL N/A 06/17/2020   NO stricture or other prob to explain pt's dysphagia, dilation was done anyway.   Gastric bx's-->gastritis, h pylori neg. Procedure: ESOPHAGOGASTRODUODENOSCOPY (EGD) WITH PROPOFOL;  Surgeon: Jeani Hawking, MD;  Location: [facility];  Service: Endoscopy;  Laterality: N/A;   EYE SURGERY Left 2012-2013   "injections for ~ 1 yr; don't really know what for" (07/12/2013)   HEEL SPUR SURGERY Left 2008   kidney stone removal Right    KNEE SURGERY  2005   LEFT HEART CATHETERIZATION WITH CORONARY ANGIOGRAM N/A 01/30/2013   Procedure: LEFT HEART CATHETERIZATION WITH CORONARY ANGIOGRAM;  Surgeon: Robynn Pane, MD;  Location: [facility];  Service: Cardiovascular;  Laterality: N/A;   MALONEY DILATION  06/17/2020   Procedure: Elease Hashimoto DILATION;  Surgeon: Jeani Hawking, MD;  Location: [facility];  Service: Endoscopy;;   nasolaryngoscopy     06/2023 during hosp for resp failure-->normal   PLANTAR FASCIA RELEASE  Left 2008   SPIROMETRY  04/25/2014   In hosp for acute asthma/COPD flare: mixed obstructive and restrictive lung disease. The FEV1 is severely reduced at 45% predicted.  FEV1 signif decreased compared to prior spirometry 07/23/13.   TENDON RELEASE  1996   Right forearm and hand   TOTAL ABDOMINAL HYSTERECTOMY  1974   TRANSTHORACIC ECHOCARDIOGRAM  01/2013; 04/2014;08/2015; 09/2017   2014--NORMAL.  2015--focal basal septal hypertrophy, EF 55-60%, grade I diast dysfxn, mild LAE.  08/2015 EF 55-60%, nl LV syst fxn, grade I DD, valves wnl. 10/21/17: EF 65-70%, grd I DD, o/w normal. 02/17/22 EF 70-75%, hyperdynamic LV fxn, grd I DD.    Outpatient Medications Prior to Visit  Medication Sig Dispense Refill   albuterol (PROVENTIL) (2.5 MG/3ML) 0.083% nebulizer solution Take 3 mLs (2.5 mg total) by nebulization every 6 (six) hours as needed for wheezing or shortness of breath. 75 mL 3   albuterol (VENTOLIN HFA) 108 (90 Base) MCG/ACT inhaler Inhale 1-2 puffs into the lungs every 6 (six) hours as needed for wheezing or shortness of breath. 18 g 5   ALPRAZolam (XANAX) 0.5 MG tablet TAKE ONE  TABLET BY MOUTH THREE TIMES DAILY 90 tablet 5   amLODipine (NORVASC) 5 MG tablet TAKE ONE TABLET BY MOUTH DAILY 90 tablet 3   aspirin 81 MG tablet Take 81 mg by mouth at bedtime.     benzonatate (TESSALON) 200 MG capsule Take 1 capsule (200 mg total) by mouth 3 (three) times daily as needed. 45 capsule 3   Budeson-Glycopyrrol-Formoterol (BREZTRI AEROSPHERE) 160-9-4.8 MCG/ACT AERO Inhale 2 puffs into the lungs in the morning and at bedtime.     Budeson-Glycopyrrol-Formoterol (BREZTRI AEROSPHERE) 160-9-4.8 MCG/ACT AERO Inhale 2 puffs into the lungs in the morning and at bedtime. 10.7 g 11   buprenorphine (BUTRANS) 5 MCG/HR PTWK Place 1 patch onto the skin once a week. 4 patch 2   diclofenac Sodium (VOLTAREN) 1 % GEL Apply 2 g topically 4 (four) times daily. 4 times a day to both knees (Patient taking differently: Apply 2 g topically 4 (four) times daily as needed (for pain- to affected areas).) 1000 each 4   DULoxetine (CYMBALTA) 60 MG capsule Take 1 capsule (60 mg total) by mouth daily. (Patient taking differently: Take 60 mg by mouth at bedtime.) 90 capsule 1   EPINEPHrine 0.3 mg/0.3 mL IJ SOAJ injection Inject 0.3 mg into the muscle as needed for anaphylaxis. 1 each 1   fluticasone (FLONASE) 50 MCG/ACT nasal spray Place 1 spray into both nostrils at bedtime. 16 g 0   furosemide (LASIX) 20 MG tablet Take 1 tablet (20 mg total) by mouth daily. 30 tablet 0   gabapentin (NEURONTIN) 300 MG capsule Take 1 capsule (300 mg total) by mouth 2 (two) times daily. 60 capsule 1   ipratropium (ATROVENT) 0.03 % nasal spray Place 2 sprays into both nostrils every 12 (twelve) hours as needed (allergies). 30 mL 5   isosorbide mononitrate (IMDUR) 30 MG 24 hr tablet Take 2 tablets (60 mg total) by mouth in the morning and at bedtime. 270 tablet 2   lamoTRIgine (LAMICTAL) 200 MG tablet TAKE ONE TABLET BY MOUTH DAILY 90 tablet 1   loratadine (CLARITIN) 10 MG tablet Take 1 tablet (10 mg total) by mouth daily as needed for  allergies or rhinitis. 90 tablet 0   methocarbamol (ROBAXIN) 500 MG tablet Take 1 tablet (500 mg total) by mouth every 8 (eight) hours as needed for muscle spasms. 60 tablet 2  metoprolol succinate (TOPROL-XL) 100 MG 24 hr tablet take ONE tab daily, take additional 1/2 tab as needed FOR FOR BLOOD PRESSURE > 160/90 (Patient taking differently: Take 50-100 mg by mouth See admin instructions. Take 100 mg by mouth once a day and an additional 50 mg as needed for a B/P greater than 160/90) 135 tablet 1   Multiple Vitamin (MULTIVITAMIN WITH MINERALS) TABS tablet Take 1 tablet by mouth daily with breakfast.     nitroGLYCERIN (NITROSTAT) 0.4 MG SL tablet Place 1 tablet (0.4 mg total) under the tongue every 5 (five) minutes as needed for chest pain (x 3 doses). 25 tablet 11   oxyCODONE-acetaminophen (PERCOCET) 10-325 MG tablet Take 1 tablet by mouth every 6 (six) hours as needed for pain. 120 tablet 0   OXYGEN Inhale 3 L/min into the lungs continuous.     pantoprazole (PROTONIX) 40 MG tablet Take 1 tablet (40 mg total) by mouth 2 (two) times daily. 180 tablet 1   rOPINIRole (REQUIP) 0.5 MG tablet TAKE ONE TABLET BY MOUTH AT BEDTIME (Patient taking differently: Take 0.5 mg by mouth at bedtime as needed. TAKE ONE TABLET BY MOUTH AT BEDTIME) 90 tablet 1   rosuvastatin (CRESTOR) 40 MG tablet Take 1 tablet (40 mg total) by mouth every evening. 90 tablet 3   solifenacin (VESICARE) 10 MG tablet Take 1 tablet (10 mg total) by mouth daily. 30 tablet 2   Spacer/Aero-Holding Chambers DEVI 1 puff by Does not apply route 2 (two) times daily. 1 each 1   traZODone (DESYREL) 50 MG tablet TAKE 2-4 TABLETS BY MOUTH AT BEDTIME AS NEEDED INSOMNIA 120 tablet 2   doxycycline (VIBRA-TABS) 100 MG tablet Take 1 tablet (100 mg total) by mouth 2 (two) times daily. 14 tablet 0   HYDROcodone bit-homatropine (HYCODAN) 5-1.5 MG/5ML syrup Take 5 mLs by mouth at bedtime as needed for cough. 240 mL 0   predniSONE (DELTASONE) 2.5 MG tablet 2  tabs po every day x 7 days then 1 tab po every day x 7 days 21 tablet 0   montelukast (SINGULAIR) 10 MG tablet Take 1 tablet (10 mg total) by mouth at bedtime. 90 tablet 3   No facility-administered medications prior to visit.    Allergies  Allergen Reactions   Aripiprazole Rash and Other (See Comments)    Parkinsonism, also   Penicillins Shortness Of Breath, Itching, Swelling, Rash and Other (See Comments)    Tolerated Cefepime in ED   Bactrim [Sulfamethoxazole-Trimethoprim] Rash    Review of Systems As per HPI  PE:    10/15/2023    1:18 PM 10/07/2023   11:11 AM 09/23/2023    3:15 PM  Vitals with BMI  Height  5\' 4"  5\' 4"   Weight 167 lbs 164 lbs 13 oz 163 lbs 6 oz  BMI 28.65 28.27 28.03  Systolic 112 94 128  Diastolic 70 60 70  Pulse 67 74 79     Physical Exam  Gen: Alert, well appearing.  Patient is oriented to person, place, time, and situation. Lungs are clear to inspiration bilaterally, trace expiratory wheeze, prolonged expiratory phase, decreased aeration diffusely.  Frequent coughing with exhalation.  Nonlabored respirations. Cardiovascular exam: Regular rhythm and rate without murmur rub or gallop. Remedies: No cyanosis or edema.  LABS:  Last CBC Lab Results  Component Value Date   WBC 10.6 (H) 08/19/2023   HGB 12.4 08/19/2023   HCT 38.5 08/19/2023   MCV 88.7 08/19/2023   MCH 28.3 07/16/2023   RDW  14.8 08/19/2023   PLT 223.0 08/19/2023   Last metabolic panel Lab Results  Component Value Date   GLUCOSE 96 10/15/2023   NA 139 10/15/2023   K 4.1 10/15/2023   CL 101 10/15/2023   CO2 31 10/15/2023   BUN 16 10/15/2023   CREATININE 0.90 10/15/2023   GFR 61.64 10/15/2023   CALCIUM 9.1 10/15/2023   PHOS 4.0 02/27/2022   PROT 7.0 07/16/2023   ALBUMIN 3.5 07/16/2023   BILITOT 0.5 07/16/2023   ALKPHOS 58 07/16/2023   AST 22 07/16/2023   ALT 29 07/16/2023   ANIONGAP 9 07/16/2023   Last lipids Lab Results  Component Value Date   CHOL 121  10/15/2023   HDL 39.50 10/15/2023   LDLCALC 51 10/15/2023   TRIG 152.0 (H) 10/15/2023   CHOLHDL 3 10/15/2023   Last hemoglobin A1c Lab Results  Component Value Date   HGBA1C 5.3 02/27/2022    IMPRESSION AND PLAN:  #1 labile hypertension, stable today. Continue amlodipine 5 mg a day, Imdur 60 mg a day, and Toprol XL 100 mg a day. Basic metabolic panel today.  2.  Acute exacerbation of asthma.  She has underlying chronic hypoxic respiratory failure due to asbestosis with pleural plaques. Prednisone 20 mg a day x 5 days then 10 mg a day x 5 days. Continue Breztri 2 puffs twice daily and albuterol nebulizer every 4 hours as needed.  3.  Recurrent major depressive disorder, history of treatment resistant. She is in remission on Lamictal 200 mg a day and Cymbalta 60 mg a day.  #4 GAD.  Continue alprazolam 0.5 mg 3 times a day as well as duloxetine 60 mg a day. Urine drug screen up-to-date.  #5 hypercholesterolemia, doing well on Crestor 40 mg a day. Fasting lipid panel today.  An After Visit Summary was printed and given to the patient.  FOLLOW UP: Return in about 2 weeks (around 10/29/2023) for f/u resp.  Signed:  Santiago Bumpers, MD           10/15/2023

## 2023-10-16 DIAGNOSIS — M6281 Muscle weakness (generalized): Secondary | ICD-10-CM | POA: Diagnosis not present

## 2023-10-18 ENCOUNTER — Other Ambulatory Visit: Payer: Self-pay | Admitting: Family Medicine

## 2023-10-20 DIAGNOSIS — R2689 Other abnormalities of gait and mobility: Secondary | ICD-10-CM | POA: Diagnosis not present

## 2023-10-20 DIAGNOSIS — R490 Dysphonia: Secondary | ICD-10-CM | POA: Diagnosis not present

## 2023-10-20 DIAGNOSIS — R41841 Cognitive communication deficit: Secondary | ICD-10-CM | POA: Diagnosis not present

## 2023-10-20 DIAGNOSIS — M6281 Muscle weakness (generalized): Secondary | ICD-10-CM | POA: Diagnosis not present

## 2023-10-21 DIAGNOSIS — M6281 Muscle weakness (generalized): Secondary | ICD-10-CM | POA: Diagnosis not present

## 2023-10-22 DIAGNOSIS — R2689 Other abnormalities of gait and mobility: Secondary | ICD-10-CM | POA: Diagnosis not present

## 2023-10-22 DIAGNOSIS — M6281 Muscle weakness (generalized): Secondary | ICD-10-CM | POA: Diagnosis not present

## 2023-10-23 DIAGNOSIS — M6281 Muscle weakness (generalized): Secondary | ICD-10-CM | POA: Diagnosis not present

## 2023-10-24 DIAGNOSIS — R490 Dysphonia: Secondary | ICD-10-CM | POA: Diagnosis not present

## 2023-10-24 DIAGNOSIS — R41841 Cognitive communication deficit: Secondary | ICD-10-CM | POA: Diagnosis not present

## 2023-10-27 DIAGNOSIS — M6281 Muscle weakness (generalized): Secondary | ICD-10-CM | POA: Diagnosis not present

## 2023-10-27 DIAGNOSIS — R2689 Other abnormalities of gait and mobility: Secondary | ICD-10-CM | POA: Diagnosis not present

## 2023-10-27 DIAGNOSIS — R41841 Cognitive communication deficit: Secondary | ICD-10-CM | POA: Diagnosis not present

## 2023-10-27 DIAGNOSIS — R490 Dysphonia: Secondary | ICD-10-CM | POA: Diagnosis not present

## 2023-10-29 ENCOUNTER — Ambulatory Visit (INDEPENDENT_AMBULATORY_CARE_PROVIDER_SITE_OTHER): Payer: Medicare Other | Admitting: Family Medicine

## 2023-10-29 ENCOUNTER — Encounter: Payer: Self-pay | Admitting: Family Medicine

## 2023-10-29 VITALS — BP 127/72 | HR 70 | Wt 164.8 lb

## 2023-10-29 DIAGNOSIS — J9621 Acute and chronic respiratory failure with hypoxia: Secondary | ICD-10-CM | POA: Diagnosis not present

## 2023-10-29 DIAGNOSIS — J4521 Mild intermittent asthma with (acute) exacerbation: Secondary | ICD-10-CM | POA: Diagnosis not present

## 2023-10-29 DIAGNOSIS — J920 Pleural plaque with presence of asbestos: Secondary | ICD-10-CM

## 2023-10-29 DIAGNOSIS — M6281 Muscle weakness (generalized): Secondary | ICD-10-CM | POA: Diagnosis not present

## 2023-10-29 DIAGNOSIS — R2689 Other abnormalities of gait and mobility: Secondary | ICD-10-CM | POA: Diagnosis not present

## 2023-10-29 MED ORDER — FORMOTEROL FUMARATE 20 MCG/2ML IN NEBU: 20.0000 ug | INHALATION_SOLUTION | Freq: Two times a day (BID) | RESPIRATORY_TRACT | 11 refills | Status: DC

## 2023-10-29 MED ORDER — PREDNISONE 5 MG PO TABS: ORAL_TABLET | ORAL | 0 refills | Status: DC

## 2023-10-29 MED ORDER — DULOXETINE HCL 60 MG PO CPEP: 60.0000 mg | ORAL_CAPSULE | Freq: Every day | ORAL | 3 refills | Status: DC

## 2023-10-29 MED ORDER — GABAPENTIN 300 MG PO CAPS: 300.0000 mg | ORAL_CAPSULE | Freq: Two times a day (BID) | ORAL | 3 refills | Status: DC

## 2023-10-29 MED ORDER — BUDESONIDE 0.5 MG/2ML IN SUSP: 0.5000 mg | Freq: Two times a day (BID) | RESPIRATORY_TRACT | 11 refills | Status: DC

## 2023-10-29 NOTE — Progress Notes (Signed)
OFFICE VISIT  10/31/2023  CC:  Chief Complaint  Patient presents with   Asthma    Pt states she feels her asthma is controlled as long as she has her oxygen on.    Patient is a 77 y.o. female who presents for 2 wk f/u asthma exacerbation. A/P as of last visit: "#1 labile hypertension, stable today. Continue amlodipine 5 mg a day, Imdur 60 mg a day, and Toprol XL 100 mg a day. Basic metabolic panel today.   2.  Acute exacerbation of asthma.  She has underlying chronic hypoxic respiratory failure due to asbestosis with pleural plaques. Prednisone 20 mg a day x 5 days then 10 mg a day x 5 days. Continue Breztri 2 puffs twice daily and albuterol nebulizer every 4 hours as needed.   3.  Recurrent major depressive disorder, history of treatment resistant. She is in remission on Lamictal 200 mg a day and Cymbalta 60 mg a day."  INTERIM HX: No significant change in cough/wheeze/shortness of breath/dyspnea on exertion. Denies any sinus symptoms.  She feels some postnasal drip mucus that she coughs up but does not feel like she coughs any from the chest.  She has had no fever.  She is faithfully using Breztri as well as albuterol several times a day. No recent legs swelling or abnormal weight gain. No chest pain, no nausea, vomiting, or diarrhea. No rash, no headaches, no focal weakness.  Past Medical History:  Diagnosis Date   Acute upper GI bleed 06/2014   while pt taking coumadin, plavix, and meloxicam---despite being told not to take coumadin.   Anginal pain (HCC)    Nonobstructive CAD 2014; however, her cardiologist put her on a statin for this and NOT for hyperlipidemia per pt report.  Atyp CP 08/2017 at card f/u, plan for myoc perf imaging.   Anxiety    panic attacks   BPPV (benign paroxysmal positional vertigo) 12/16/2012   CAD (coronary artery disease)    Nonobstructive ([facility])   Chronic diastolic CHF (congestive heart failure) (HCC)    dry wt as of 11/06/16 is 168 lbs.    Chronic lower back pain    DDD (degenerative disc disease)    lumbar and cervical.    Diverticular disease    Fibromyalgia    Patient states dx was around her late 53s but she had sx's for years prior to this.   H/O hiatal hernia    History of pneumonia    hospitalized 12/2011, 02/2013, and 07/2013 [facility]) for this   HTN (hypertension)    Renal artery dopplers 04/2013 neg for stenosis.   Hypervitaminosis D 09/27/2019   over-supplemented.  Stopped vit D and plan recheck 2 mo.   Idiopathic angio-edema-urticaria 57846; 2021   Angioedema component was very minimal.  2021->Dr. Bobbitt (allergist) eval.   Insomnia    Iron deficiency anemia    Hematologist in [place], [place] did extensive w/u; no cause found; failed oral supplement;; gets fairly regular (q65m or so) IV iron infusions (Venofer -iron sucrose- 200mg  with procrit.  "for 14 yr I've been getting blood work q month & getting infusions prn" (07/12/2013).  Dr. Myna Hidalgo locally, iron infusions done, EPO deficiency dx'd   Migraine syndrome    "not as often anymore; used to be ~ q wk" (07/12/2013)   Mixed incontinence urge and stress    Nephrolithiasis    "passed all on my own or they are still in there" (07/12/2013)   Neuroleptic induced parkinsonism (HCC) 2018  Dr. Arbutus Leas, neuro, saw her 11/24/17 and recommended d/c of abilify as first step.  D/c'd abilify and pt got complete recovery.   Oropharyngeal dysphagia    swallowing study speech path 05/2020. Gastric bx's showed gastritis, h pylori NEG   OSA on CPAP    prior to move to [place]--had another sleep study 10/2015 w/pulm Dr. Su Monks.   Osteoarthritis    "severe; progressing fast" (07/12/2013); multiple joints-not surgical candidate for TKR (03/2015).  Triamcinolon knee injections by Dr. Hermelinda Medicus 12/2017.   Pernicious anemia 08/24/2014   Pleural plaque with presence of asbestos 07/22/2013   Pulmonary embolism (HCC) 07/2013   Dx at [facility] with very small peripheral upper lobe pe 07/2013: pt took coumadin for  about 8-9 mo   Pyelonephritis    "several times over the last 30 yr" (07/12/2013)   RBBB (right bundle branch block)    Recurrent major depression (HCC)    Recurrent UTI    hx of hospitalization for pyelonephritis; started abx prophylaxis 06/2015   Syncope    Hypotensive; ED visit--Dr. Sharyn Lull did Cath--nonobstructive CAD, EF 55-60%.  In retrospect, suspect pt rx med misuse/polypharmacy   Vertebral compression fracture (HCC)    Acute T12 on 02/27/22 (fall).  multiple old thoracic-->neurosurg to do MRI as of 05/2022    Past Surgical History:  Procedure Laterality Date   APPENDECTOMY  1960   AXILLARY SURGERY Left 1978   Multiple "lump" in armpit per pt   BIOPSY  06/17/2020   Gastric bx->gastritis, h pylori neg.  Procedure: BIOPSY;  Surgeon: Jeani Hawking, MD;  Location: [facility];  Service: Endoscopy;;   CARDIAC CATHETERIZATION  01/2013   nonobstructive CAD, EF 55-60%   CARDIOVASCULAR STRESS TEST  02/22/2015   Low risk myocard perf imaging; wall motion normal, normal EF   CARDIOVASCULAR STRESS TEST     09/2017 myo perf low risk   carotid duplex doppler  10/21/2017   01/2023 1-30% bilat-->rpt 1 yr   COCCYX REMOVAL  1972   CT CTA CORONARY W/CA SCORE W/CM &/OR WO/CM     04/02/23 96%'tile calcium score, nonobst CAD-->crestor increased to 40   DEXA  06/05/2017   T-score -3.1   DILATION AND CURETTAGE OF UTERUS  ? 1970's   ESOPHAGOGASTRODUODENOSCOPY N/A 07/19/2014   Gastritis found + in the setting of supratherapeutic INR, +plavix, + meloxicam.   ESOPHAGOGASTRODUODENOSCOPY (EGD) WITH PROPOFOL N/A 06/17/2020   NO stricture or other prob to explain pt's dysphagia, dilation was done anyway.  Gastric bx's-->gastritis, h pylori neg. Procedure: ESOPHAGOGASTRODUODENOSCOPY (EGD) WITH PROPOFOL;  Surgeon: Jeani Hawking, MD;  Location: [facility];  Service: Endoscopy;  Laterality: N/A;   EYE SURGERY Left 2012-2013   "injections for ~ 1 yr; don't really know what for" (07/12/2013)   HEEL SPUR SURGERY  Left 2008   kidney stone removal Right    KNEE SURGERY  2005   LEFT HEART CATHETERIZATION WITH CORONARY ANGIOGRAM N/A 01/30/2013   Procedure: LEFT HEART CATHETERIZATION WITH CORONARY ANGIOGRAM;  Surgeon: Robynn Pane, MD;  Location: [facility]ATH [facility];  Service: Cardiovascular;  Laterality: N/A;   MALONEY DILATION  06/17/2020   Procedure: Elease Hashimoto DILATION;  Surgeon: Jeani Hawking, MD;  Location: [facility];  Service: Endoscopy;;   nasolaryngoscopy     06/2023 during hosp for resp failure-->normal   PLANTAR FASCIA RELEASE Left 2008   SPIROMETRY  04/25/2014   In hosp for acute asthma/COPD flare: mixed obstructive and restrictive lung disease. The FEV1 is severely reduced at 45% predicted.  FEV1 signif decreased  compared to prior spirometry 07/23/13.   TENDON RELEASE  1996   Right forearm and hand   TOTAL ABDOMINAL HYSTERECTOMY  1974   TRANSTHORACIC ECHOCARDIOGRAM  01/2013; 04/2014;08/2015; 09/2017   2014--NORMAL.  2015--focal basal septal hypertrophy, EF 55-60%, grade I diast dysfxn, mild LAE.  08/2015 EF 55-60%, nl LV syst fxn, grade I DD, valves wnl. 10/21/17: EF 65-70%, grd I DD, o/w normal. 02/17/22 EF 70-75%, hyperdynamic LV fxn, grd I DD.    Outpatient Medications Prior to Visit  Medication Sig Dispense Refill   albuterol (PROVENTIL) (2.5 MG/3ML) 0.083% nebulizer solution Take 3 mLs (2.5 mg total) by nebulization every 6 (six) hours as needed for wheezing or shortness of breath. 75 mL 3   albuterol (VENTOLIN HFA) 108 (90 Base) MCG/ACT inhaler Inhale 1-2 puffs into the lungs every 6 (six) hours as needed for wheezing or shortness of breath. 18 g 5   ALPRAZolam (XANAX) 0.5 MG tablet TAKE ONE TABLET BY MOUTH THREE TIMES DAILY 90 tablet 5   amLODipine (NORVASC) 5 MG tablet TAKE ONE TABLET BY MOUTH DAILY 90 tablet 3   aspirin 81 MG tablet Take 81 mg by mouth at bedtime.     benzonatate (TESSALON) 200 MG capsule Take 1 capsule (200 mg total) by mouth 3 (three) times daily as needed. 45 capsule 3    buprenorphine (BUTRANS) 5 MCG/HR PTWK Place 1 patch onto the skin once a week. 4 patch 2   diclofenac Sodium (VOLTAREN) 1 % GEL Apply 2 g topically 4 (four) times daily. 4 times a day to both knees (Patient taking differently: Apply 2 g topically 4 (four) times daily as needed (for pain- to affected areas).) 1000 each 4   EPINEPHrine 0.3 mg/0.3 mL IJ SOAJ injection Inject 0.3 mg into the muscle as needed for anaphylaxis. 1 each 1   fluticasone (FLONASE) 50 MCG/ACT nasal spray Place 1 spray into both nostrils at bedtime. 16 g 0   furosemide (LASIX) 20 MG tablet Take 1 tablet (20 mg total) by mouth daily. 30 tablet 0   ipratropium (ATROVENT) 0.03 % nasal spray Place 2 sprays into both nostrils every 12 (twelve) hours as needed (allergies). 30 mL 5   isosorbide mononitrate (IMDUR) 30 MG 24 hr tablet Take 2 tablets (60 mg total) by mouth in the morning and at bedtime. 270 tablet 2   lamoTRIgine (LAMICTAL) 200 MG tablet TAKE ONE TABLET BY MOUTH DAILY 90 tablet 1   loratadine (CLARITIN) 10 MG tablet Take 1 tablet (10 mg total) by mouth daily as needed for allergies or rhinitis. 90 tablet 0   methocarbamol (ROBAXIN) 500 MG tablet Take 1 tablet (500 mg total) by mouth every 8 (eight) hours as needed for muscle spasms. 60 tablet 2   metoprolol succinate (TOPROL-XL) 100 MG 24 hr tablet take ONE tab daily, take additional 1/2 tab as needed FOR FOR BLOOD PRESSURE > 160/90 (Patient taking differently: Take 50-100 mg by mouth See admin instructions. Take 100 mg by mouth once a day and an additional 50 mg as needed for a B/P greater than 160/90) 135 tablet 1   Multiple Vitamin (MULTIVITAMIN WITH MINERALS) TABS tablet Take 1 tablet by mouth daily with breakfast.     nitroGLYCERIN (NITROSTAT) 0.4 MG SL tablet Place 1 tablet (0.4 mg total) under the tongue every 5 (five) minutes as needed for chest pain (x 3 doses). 25 tablet 11   oxyCODONE-acetaminophen (PERCOCET) 10-325 MG tablet Take 1 tablet by mouth every 6 (six)  hours as  needed for pain. 120 tablet 0   OXYGEN Inhale 3 L/min into the lungs continuous.     pantoprazole (PROTONIX) 40 MG tablet TAKE ONE TABLET BY MOUTH TWICE DAILY 180 tablet 1   rOPINIRole (REQUIP) 0.5 MG tablet TAKE ONE TABLET BY MOUTH AT BEDTIME (Patient taking differently: Take 0.5 mg by mouth at bedtime as needed. TAKE ONE TABLET BY MOUTH AT BEDTIME) 90 tablet 1   rosuvastatin (CRESTOR) 40 MG tablet Take 1 tablet (40 mg total) by mouth every evening. 90 tablet 3   solifenacin (VESICARE) 10 MG tablet Take 1 tablet (10 mg total) by mouth daily. 30 tablet 2   Spacer/Aero-Holding Chambers DEVI 1 puff by Does not apply route 2 (two) times daily. 1 each 1   traZODone (DESYREL) 50 MG tablet TAKE 2-4 TABLETS BY MOUTH AT BEDTIME AS NEEDED INSOMNIA 120 tablet 2   Budeson-Glycopyrrol-Formoterol (BREZTRI AEROSPHERE) 160-9-4.8 MCG/ACT AERO Inhale 2 puffs into the lungs in the morning and at bedtime.     Budeson-Glycopyrrol-Formoterol (BREZTRI AEROSPHERE) 160-9-4.8 MCG/ACT AERO Inhale 2 puffs into the lungs in the morning and at bedtime. 10.7 g 11   predniSONE (DELTASONE) 10 MG tablet 2 tabs po every day x 5d then 1 tab po every day x 5d 15 tablet 0   montelukast (SINGULAIR) 10 MG tablet Take 1 tablet (10 mg total) by mouth at bedtime. 90 tablet 3   DULoxetine (CYMBALTA) 60 MG capsule Take 1 capsule (60 mg total) by mouth daily. (Patient taking differently: Take 60 mg by mouth at bedtime.) 90 capsule 1   gabapentin (NEURONTIN) 300 MG capsule Take 1 capsule (300 mg total) by mouth 2 (two) times daily. 60 capsule 1   No facility-administered medications prior to visit.    Allergies  Allergen Reactions   Aripiprazole Rash and Other (See Comments)    Parkinsonism, also   Penicillins Shortness Of Breath, Itching, Swelling, Rash and Other (See Comments)    Tolerated Cefepime in ED   Bactrim [Sulfamethoxazole-Trimethoprim] Rash    Review of Systems As per HPI  PE:    10/29/2023    1:11 PM  10/15/2023    1:18 PM 10/07/2023   11:11 AM  Vitals with BMI  Height   5\' 4"   Weight 164 lbs 13 oz 167 lbs 164 lbs 13 oz  BMI  28.65 28.27  Systolic 127 112 94  Diastolic 72 70 60  Pulse 70 67 74  O2 sat 91% 2L 02  Physical Exam  General: Ambulates with rolling walker slowly.  Mildly dyspneic with ambulation.  This improves with sitting.  She is alert and attentive.  Oriented x 4. Lungs have diffuse exp wheezing, diminished aeration diffusely, prolonged exp phase, frequent coughing when auscultation performed. CV: RRR, no murm EXT: no edema  LABS:  Last metabolic panel Lab Results  Component Value Date   GLUCOSE 96 10/15/2023   NA 139 10/15/2023   K 4.1 10/15/2023   CL 101 10/15/2023   CO2 31 10/15/2023   BUN 16 10/15/2023   CREATININE 0.90 10/15/2023   GFR 61.64 10/15/2023   CALCIUM 9.1 10/15/2023   PHOS 4.0 02/27/2022   PROT 7.0 07/16/2023   ALBUMIN 3.5 07/16/2023   BILITOT 0.5 07/16/2023   ALKPHOS 58 07/16/2023   AST 22 07/16/2023   ALT 29 07/16/2023   ANIONGAP 9 07/16/2023   IMPRESSION AND PLAN:  Poorly controlled asthma in the setting of pleuritic and parenchymal pulmonary asbestosis. Will discontinue Breztri. Start budesonide 0.5 mg nebulizer twice  daily and formoterol 20 mg nebulizer twice daily. Restart low-dose prednisone for the next 3 weeks: 10 mg x 10 days, then 5 mg x 10 days. Albuterol nebulizer 2.5 mg every 6 hours as needed. Continue 3 L oxygen at rest and 4 L with activity.  Obtain chest radiograph to see if any sign of new focal infiltrate.--ordered today. No antibiotics at this time.  An After Visit Summary was printed and given to the patient.  FOLLOW UP: Return in about 2 weeks (around 11/12/2023) for f/u pulm.  Signed:  Santiago Bumpers, MD           10/31/2023

## 2023-10-29 NOTE — Patient Instructions (Signed)
Mix the budesonide nebulizer solution with formoterol nebulizer solution and take this through your nebulizer machine TWICE EVERY DAY.  Stop breztri.  Continue albuterol every 6 hours AS NEEDED for wheezing.

## 2023-10-30 DIAGNOSIS — M6281 Muscle weakness (generalized): Secondary | ICD-10-CM | POA: Diagnosis not present

## 2023-10-31 DIAGNOSIS — R41841 Cognitive communication deficit: Secondary | ICD-10-CM | POA: Diagnosis not present

## 2023-10-31 DIAGNOSIS — R490 Dysphonia: Secondary | ICD-10-CM | POA: Diagnosis not present

## 2023-11-03 ENCOUNTER — Encounter: Payer: Self-pay | Admitting: Pulmonary Disease

## 2023-11-03 ENCOUNTER — Ambulatory Visit (INDEPENDENT_AMBULATORY_CARE_PROVIDER_SITE_OTHER): Payer: Medicare Other | Admitting: Pulmonary Disease

## 2023-11-03 VITALS — BP 118/62 | HR 72 | Temp 98.8°F | Ht 64.0 in | Wt 163.6 lb

## 2023-11-03 DIAGNOSIS — J9621 Acute and chronic respiratory failure with hypoxia: Secondary | ICD-10-CM

## 2023-11-03 DIAGNOSIS — J920 Pleural plaque with presence of asbestos: Secondary | ICD-10-CM

## 2023-11-03 DIAGNOSIS — J984 Other disorders of lung: Secondary | ICD-10-CM | POA: Diagnosis not present

## 2023-11-03 NOTE — Progress Notes (Signed)
@Patient  ID: Linda [name], female    DOB: [birth year], 77 y.o.   MRN: [id number]  Chief Complaint  Patient presents with   Follow-up    Cough, wheeze, SOB, chest tightness and congestion is same since last OV 09/23/2023.    Referring provider: Jeoffrey Massed, MD  HPI:   77 y.o. woman with chronic right hypoxemic respiratory failure and asbestosis and pleural plaques whom we are seeing for evaluation of chronic cough.  Most recent pulmonary note 09/23/2023 reviewed.  Most recent PCP note x 3 reviewed.  Continues with uncontrolled dyspnea and cough.  Despite multiple doses of prednisone, escalating and de-escalating over time.  On further review of questioning she has not felt the prednisone has helped at all.  Really for months.  She was switched from [place] to nebulized ICS/LABA.  I think this is a reasonable change and we agreed to continue this.  Ultimately can try to be aggressive as possible with biologic therapy for her underlying asthma.  However I suspect her uncontrolled symptoms are more related to asbestosis and developing left lower greater than right lower lobe fibrosis.  HPI initial visit: Hospitalized 06/2023 with worsening breathing.  Diagnosed with asthma exacerbation.  Has course of antibiotics with sinusitis.  Steroids as well.  Slowly improved.  CT scan noted to have pleural plaques with some pleural thickening.  She states he had a chronic cough for a couple months prior to hospitalization.  Now present for 4+ months.  Dry.  Feels like a lot of congestion in the chest.  But cannot bring things up.  Feels like he needs it frequently.  On Breztri.  Helps a little bit but wears off during the day.  Currently on prednisone course.  Does not feel that it helped at all.  Questionaires / Pulmonary Flowsheets:   ACT:  Asthma Control Test ACT Total Score  02/28/2020  1:00 PM 17    MMRC:     No data to display          [facility]:      No data to display           Tests:   FENO:  Lab Results  Component Value Date   NITRICOXIDE TEST WILL BE CREDITED 01/30/2013    PFT:    Latest Ref Rng & Units 04/25/2014   11:44 AM  PFT Results  FVC-Pre L 1.26   FVC-Predicted Pre % 41   Pre FEV1/FVC % % 85   FEV1-Pre L 1.07   FEV1-Predicted Pre % 45   Personally reviewed, spirometry indicative of severe restriction versus air trapping, no fixed obstruction  WALK:     09/01/2023    4:30 PM 08/16/2013   12:38 PM  SIX MIN WALK  Supplimental Oxygen during Test? (L/min) No No  Tech Comments: Patient was only able to complete 1 lap-stopped due to wheezing and sob. Patient 02 dropped to 88% on 1st was started on 1L of 02-02 still not improving much-increased o2 to 2L. Patient was able to maintian o2 at 93% Pt stopped after 1st lap d/t walking instabaility.     Imaging: Personally as per EMR discussion this note NM PET Image Initial (PI) Skull Base To Thigh  Result Date: 10/18/2023 CLINICAL DATA:  Initial treatment strategy for asbestos related pleural plaques. EXAM: NUCLEAR MEDICINE PET SKULL BASE TO THIGH TECHNIQUE: 8.2 mCi F-18 FDG was injected intravenously. Full-ring PET imaging was performed from the skull base to thigh after the radiotracer. CT  data was obtained and used for attenuation correction and anatomic localization. Fasting blood glucose: 96 mg/dl COMPARISON:  Chest radiographs dated 08/28/2023. CT chest dated 07/06/2023. FINDINGS: Mediastinal blood pool activity: SUV max 3.3 Liver activity: SUV max NA NECK: No hypermetabolic lymph nodes in the neck. Incidental CT findings: None. CHEST: Extensive calcified pleural plaques bilaterally. Associated subpleural reticulation/scarring associated with a pleural plaque in the lateral left lower hemithorax (series 7/image 33), max SUV 2.9, raising the possibility of early/mild asbestosis. No suspicious pulmonary nodules. 11 mm short axis low right paratracheal node (series 4/image 4), max SUV 3.5, favored to be  reactive. Incidental CT findings: Atherosclerotic calcifications of the aortic arch. Moderate 3 vessel coronary atherosclerosis. ABDOMEN/PELVIS: No abnormal hypermetabolic activity within the liver, pancreas, adrenal glands, or spleen. No hypermetabolic lymph nodes in the abdomen or pelvis. Incidental CT findings: Status post hysterectomy. Atherosclerotic calcifications of the abdominal aorta and branch vessels. SKELETON: No focal hypermetabolic activity to suggest skeletal metastasis. Incidental CT findings: None. IMPRESSION: Extensive calcified pleural plaques bilaterally, compatible with asbestos related pleural disease. Possible early/mild asbestosis in the lateral left lower hemithorax. No findings suspicious for malignancy. Electronically Signed   By: Charline Bills M.D.   On: 10/18/2023 01:39    Lab Results: Personally reviewed CBC    Component Value Date/Time   WBC 10.6 (H) 08/19/2023 1027   RBC 4.34 08/19/2023 1027   HGB 12.4 08/19/2023 1027   HGB 13.6 06/05/2015 1315   HCT 38.5 08/19/2023 1027   HCT 34.4 01/02/2016 0610   HCT 40.8 06/05/2015 1315   PLT 223.0 08/19/2023 1027   PLT 242 06/05/2015 1315   MCV 88.7 08/19/2023 1027   MCV 92 06/05/2015 1315   MCH 28.3 07/16/2023 0411   MCHC 32.3 08/19/2023 1027   RDW 14.8 08/19/2023 1027   RDW 13.5 06/05/2015 1315   LYMPHSABS 2.5 08/19/2023 1027   LYMPHSABS 2.4 06/05/2015 1315   MONOABS 0.9 08/19/2023 1027   EOSABS 0.1 08/19/2023 1027   EOSABS 0.0 06/05/2015 1315   BASOSABS 0.0 08/19/2023 1027   BASOSABS 0.0 06/05/2015 1315    BMET    Component Value Date/Time   NA 139 10/15/2023 1349   NA 141 03/26/2023 1413   NA 141 06/05/2015 1315   K 4.1 10/15/2023 1349   K 3.9 06/05/2015 1315   CL 101 10/15/2023 1349   CL 103 06/05/2015 1315   CO2 31 10/15/2023 1349   CO2 28 06/05/2015 1315   GLUCOSE 96 10/15/2023 1349   GLUCOSE 108 06/05/2015 1315   BUN 16 10/15/2023 1349   BUN 18 03/26/2023 1413   BUN 14 06/05/2015 1315    CREATININE 0.90 10/15/2023 1349   CREATININE 0.87 02/03/2017 1523   CALCIUM 9.1 10/15/2023 1349   CALCIUM 9.0 06/05/2015 1315   GFRNONAA >60 07/16/2023 0411   GFRNONAA 67 08/28/2016 1413   GFRAA >60 06/18/2020 0444   GFRAA 77 08/28/2016 1413    BNP    Component Value Date/Time   BNP 83.4 07/06/2023 1324   BNP 19.9 10/27/2015 1602    ProBNP    Component Value Date/Time   PROBNP 143.0 (H) 08/19/2023 1027    Specialty Problems       Pulmonary Problems   Pleural plaque with presence of asbestos   Atelectasis   OSA (obstructive sleep apnea)   Amianthosis (HCC)   [name] (HCC)   Epistaxis   Chronic respiratory failure with hypoxia (HCC)   Restrictive lung disease   COPD (chronic obstructive pulmonary disease) (  HCC)   Pulmonary asbestosis (HCC)   Acute on chronic respiratory failure (HCC)   Vocal cord dysfunction   Subacute cough   Acute respiratory failure with hypoxia (HCC)   Acute bacterial sinusitis   Moderate persistent asthma with acute exacerbation   Acute non-recurrent sphenoidal sinusitis    Allergies  Allergen Reactions   Aripiprazole Rash and Other (See Comments)    Parkinsonism, also   Penicillins Shortness Of Breath, Itching, Swelling, Rash and Other (See Comments)    Tolerated Cefepime in ED   Bactrim [Sulfamethoxazole-Trimethoprim] Rash    Immunization History  Administered Date(s) Administered   Fluad Quad(high Dose 65+) 09/17/2019, 09/29/2020, 10/11/2021   Fluad Trivalent(High Dose 65+) 08/19/2023   Influenza Split 08/23/2012   Influenza, High Dose Seasonal PF 10/01/2016, 09/04/2018   Influenza,inj,Quad PF,6+ Mos 09/28/2013, 10/05/2014   Pneumococcal Conjugate-13 05/03/2015   Pneumococcal Polysaccharide-23 03/24/2011    Past Medical History:  Diagnosis Date   Acute upper GI bleed 06/2014   while pt taking coumadin, plavix, and meloxicam---despite being told not to take coumadin.   Anginal pain (HCC)    Nonobstructive CAD 2014;  however, her cardiologist put her on a statin for this and NOT for hyperlipidemia per pt report.  Atyp CP 08/2017 at card f/u, plan for myoc perf imaging.   Anxiety    panic attacks   BPPV (benign paroxysmal positional vertigo) 12/16/2012   CAD (coronary artery disease)    Nonobstructive ([facility])   Chronic diastolic CHF (congestive heart failure) (HCC)    dry wt as of 11/06/16 is 168 lbs.   Chronic lower back pain    DDD (degenerative disc disease)    lumbar and cervical.    Diverticular disease    Fibromyalgia    Patient states dx was around her late 13s but she had sx's for years prior to this.   H/O hiatal hernia    History of pneumonia    hospitalized 12/2011, 02/2013, and 07/2013 [facility]) for this   HTN (hypertension)    Renal artery dopplers 04/2013 neg for stenosis.   Hypervitaminosis D 09/27/2019   over-supplemented.  Stopped vit D and plan recheck 2 mo.   Idiopathic angio-edema-urticaria 09323; 2021   Angioedema component was very minimal.  2021->Dr. Bobbitt (allergist) eval.   Insomnia    Iron deficiency anemia    Hematologist in [place], [place] did extensive w/u; no cause found; failed oral supplement;; gets fairly regular (q67m or so) IV iron infusions (Venofer -iron sucrose- 200mg  with procrit.  "for 14 yr I've been getting blood work q month & getting infusions prn" (07/12/2013).  Dr. Myna Hidalgo locally, iron infusions done, EPO deficiency dx'd   Migraine syndrome    "not as often anymore; used to be ~ q wk" (07/12/2013)   Mixed incontinence urge and stress    Nephrolithiasis    "passed all on my own or they are still in there" (07/12/2013)   Neuroleptic induced parkinsonism (HCC) 2018   Dr. Arbutus Leas, neuro, saw her 11/24/17 and recommended d/c of abilify as first step.  D/c'd abilify and pt got complete recovery.   Oropharyngeal dysphagia    swallowing study speech path 05/2020. Gastric bx's showed gastritis, h pylori NEG   OSA on CPAP    prior to move to [place]--had another sleep study  10/2015 w/pulm Dr. Su Monks.   Osteoarthritis    "severe; progressing fast" (07/12/2013); multiple joints-not surgical candidate for TKR (03/2015).  Triamcinolon knee injections by Dr. Hermelinda Medicus 12/2017.   Pernicious anemia  08/24/2014   Pleural plaque with presence of asbestos 07/22/2013   Pulmonary embolism (HCC) 07/2013   Dx at [facility] with very small peripheral upper lobe pe 07/2013: pt took coumadin for about 8-9 mo   Pyelonephritis    "several times over the last 30 yr" (07/12/2013)   RBBB (right bundle branch block)    Recurrent major depression (HCC)    Recurrent UTI    hx of hospitalization for pyelonephritis; started abx prophylaxis 06/2015   Syncope    Hypotensive; ED visit--Dr. Sharyn Lull did Cath--nonobstructive CAD, EF 55-60%.  In retrospect, suspect pt rx med misuse/polypharmacy   Vertebral compression fracture (HCC)    Acute T12 on 02/27/22 (fall).  multiple old thoracic-->neurosurg to do MRI as of 05/2022    Tobacco History: Social History   Tobacco Use  Smoking Status Never  Smokeless Tobacco Never  Tobacco Comments   never used tobacco   Counseling given: Not Answered Tobacco comments: never used tobacco   Continue to not smoke  Outpatient Encounter Medications as of 11/03/2023  Medication Sig   albuterol (PROVENTIL) (2.5 MG/3ML) 0.083% nebulizer solution Take 3 mLs (2.5 mg total) by nebulization every 6 (six) hours as needed for wheezing or shortness of breath.   albuterol (VENTOLIN HFA) 108 (90 Base) MCG/ACT inhaler Inhale 1-2 puffs into the lungs every 6 (six) hours as needed for wheezing or shortness of breath.   ALPRAZolam (XANAX) 0.5 MG tablet TAKE ONE TABLET BY MOUTH THREE TIMES DAILY   amLODipine (NORVASC) 5 MG tablet TAKE ONE TABLET BY MOUTH DAILY   aspirin 81 MG tablet Take 81 mg by mouth at bedtime.   benzonatate (TESSALON) 200 MG capsule Take 1 capsule (200 mg total) by mouth 3 (three) times daily as needed.   budesonide (PULMICORT) 0.5 MG/2ML nebulizer solution  Take 2 mLs (0.5 mg total) by nebulization in the morning and at bedtime.   buprenorphine (BUTRANS) 5 MCG/HR PTWK 1 patch once a week.   cyclobenzaprine (FLEXERIL) 5 MG tablet Take 5 mg by mouth 2 (two) times daily as needed.   diclofenac Sodium (VOLTAREN) 1 % GEL Apply 2 g topically 4 (four) times daily. 4 times a day to both knees (Patient taking differently: Apply 2 g topically 4 (four) times daily as needed (for pain- to affected areas).)   DULoxetine (CYMBALTA) 60 MG capsule Take 1 capsule (60 mg total) by mouth daily.   EPINEPHrine 0.3 mg/0.3 mL IJ SOAJ injection Inject 0.3 mg into the muscle as needed for anaphylaxis.   fluticasone (FLONASE) 50 MCG/ACT nasal spray Place 1 spray into both nostrils at bedtime.   formoterol (PERFOROMIST) 20 MCG/2ML nebulizer solution Take 2 mLs (20 mcg total) by nebulization 2 (two) times daily.   furosemide (LASIX) 20 MG tablet Take 1 tablet (20 mg total) by mouth daily.   gabapentin (NEURONTIN) 300 MG capsule Take 1 capsule (300 mg total) by mouth 2 (two) times daily.   ipratropium (ATROVENT) 0.03 % nasal spray Place 2 sprays into both nostrils every 12 (twelve) hours as needed (allergies).   isosorbide mononitrate (IMDUR) 30 MG 24 hr tablet Take 2 tablets (60 mg total) by mouth in the morning and at bedtime.   lamoTRIgine (LAMICTAL) 200 MG tablet TAKE ONE TABLET BY MOUTH DAILY   loratadine (CLARITIN) 10 MG tablet Take 1 tablet (10 mg total) by mouth daily as needed for allergies or rhinitis.   methocarbamol (ROBAXIN) 500 MG tablet Take 1 tablet (500 mg total) by mouth every 8 (eight) hours  as needed for muscle spasms.   metoprolol succinate (TOPROL-XL) 100 MG 24 hr tablet take ONE tab daily, take additional 1/2 tab as needed FOR FOR BLOOD PRESSURE > 160/90 (Patient taking differently: Take 50-100 mg by mouth See admin instructions. Take 100 mg by mouth once a day and an additional 50 mg as needed for a B/P greater than 160/90)   Multiple Vitamin (MULTIVITAMIN  WITH MINERALS) TABS tablet Take 1 tablet by mouth daily with breakfast.   nitroGLYCERIN (NITROSTAT) 0.4 MG SL tablet Place 1 tablet (0.4 mg total) under the tongue every 5 (five) minutes as needed for chest pain (x 3 doses).   oxyCODONE-acetaminophen (PERCOCET) 10-325 MG tablet Take 1 tablet by mouth every 6 (six) hours as needed for pain.   OXYGEN Inhale 3 L/min into the lungs continuous.   pantoprazole (PROTONIX) 40 MG tablet TAKE ONE TABLET BY MOUTH TWICE DAILY   predniSONE (DELTASONE) 5 MG tablet 2 tabs po every day x 10d then 1 tab po every day x 10d   rOPINIRole (REQUIP) 0.5 MG tablet TAKE ONE TABLET BY MOUTH AT BEDTIME (Patient taking differently: Take 0.5 mg by mouth at bedtime as needed. TAKE ONE TABLET BY MOUTH AT BEDTIME)   rosuvastatin (CRESTOR) 40 MG tablet Take 1 tablet (40 mg total) by mouth every evening.   solifenacin (VESICARE) 10 MG tablet Take 1 tablet (10 mg total) by mouth daily.   Spacer/Aero-Holding Chambers DEVI 1 puff by Does not apply route 2 (two) times daily.   traZODone (DESYREL) 50 MG tablet TAKE 2-4 TABLETS BY MOUTH AT BEDTIME AS NEEDED INSOMNIA   montelukast (SINGULAIR) 10 MG tablet Take 1 tablet (10 mg total) by mouth at bedtime.   No facility-administered encounter medications on file as of 11/03/2023.     Review of Systems  Review of Systems  N/a Physical Exam  BP 118/62 (BP Location: Left Arm, Patient Position: Sitting, Cuff Size: Normal)   Pulse 72   Temp 98.8 F (37.1 C) (Oral)   Ht 5\' 4"  (1.626 m)   Wt 163 lb 9.6 oz (74.2 kg)   SpO2 94% Comment: 3L POC pulsed oxygen  BMI 28.08 kg/m   Wt Readings from Last 5 Encounters:  11/03/23 163 lb 9.6 oz (74.2 kg)  10/29/23 164 lb 12.8 oz (74.8 kg)  10/15/23 167 lb (75.8 kg)  10/07/23 164 lb 12.8 oz (74.8 kg)  09/23/23 163 lb 6.4 oz (74.1 kg)    BMI Readings from Last 5 Encounters:  11/03/23 28.08 kg/m  10/29/23 28.29 kg/m  10/15/23 28.67 kg/m  10/07/23 28.29 kg/m  09/23/23 28.05 kg/m      Physical Exam General: Sitting in chair, no acute distress Eyes: EOMI, no icterus Neck: Supple, no JVP Pulmonary: Clear, mild tachypnea , frequent spasmodic coughs Cardiovascular: Warm, regular rate and rhythm Abdomen: Nondistended, sounds present MSK: No synovitis, no joint effusion Neuro: No focal deficits, alert Psych: Normal mood, full affect  Assessment & Plan:   Chronic hypoxemic respiratory failure: Likely multifactorial related to asbestosis, congestive heart failure and intermittent fluid overload.  Continue POC, goal O2 sat 88%.  Chronic cough: Present now for months.  Uncertain etiology.  Spasmodic cough.  Asthma possible.  However, not improved with chronic and prolonged courses of steroids.  No better with inhaled therapies.  Suspect related to worsening fibrosis.  In the left lower lobe as well as significant asbestosis pleural plaques.  Continue ICS/LABA versus via nebulized therapy.  Enroll in [place] today.  Pleural plaques due  to asbestosis: With calcification of most plaques.  Some pleural thickening.  PET scan 09/2023 negative.  Reassuring.   Return in about 3 months (around 02/03/2024) for f/u Dr. Judeth Horn.   Karren Burly, MD 11/03/2023  I spent 41 minutes in the care of the patient including review of records, coordination of care, face-to-face visit.

## 2023-11-03 NOTE — Patient Instructions (Addendum)
Nice to see you again  No changes to medications  Paperwork today for the injectable called Tezspire. Once approved we will schedule injection in office.  Return to clinic in 3 months or sooner as needed with Dr. Judeth Horn

## 2023-11-04 DIAGNOSIS — R41841 Cognitive communication deficit: Secondary | ICD-10-CM | POA: Diagnosis not present

## 2023-11-04 DIAGNOSIS — R490 Dysphonia: Secondary | ICD-10-CM | POA: Diagnosis not present

## 2023-11-04 DIAGNOSIS — M6281 Muscle weakness (generalized): Secondary | ICD-10-CM | POA: Diagnosis not present

## 2023-11-05 ENCOUNTER — Other Ambulatory Visit: Payer: Self-pay | Admitting: Pharmacist

## 2023-11-05 ENCOUNTER — Telehealth: Payer: Self-pay | Admitting: Pharmacist

## 2023-11-05 DIAGNOSIS — J4550 Severe persistent asthma, uncomplicated: Secondary | ICD-10-CM | POA: Insufficient documentation

## 2023-11-05 DIAGNOSIS — M6281 Muscle weakness (generalized): Secondary | ICD-10-CM | POA: Diagnosis not present

## 2023-11-05 DIAGNOSIS — R2689 Other abnormalities of gait and mobility: Secondary | ICD-10-CM | POA: Diagnosis not present

## 2023-11-05 NOTE — Telephone Encounter (Signed)
Patient returned call -+ she states her [place] [employer] plan is supplemental to her Medicare A/B. Reviewed benefit of receiving Tezspire at infusion center to help her get stated ASAP.  She states she coordinates transportation 2 weeks in advance or otherwise her son helps with appointment. Reviewed dosing and location of [facility]. She has been advised that infusion center will first run benefits to confirm coverage and once approved, will reach out to her to schedule an appt.  Chesley Mires, PharmD, MPH, BCPS, CPP Clinical Pharmacist (Rheumatology and Pulmonology)

## 2023-11-05 NOTE — Telephone Encounter (Signed)
Received new start paperwork for Tezspire. Patient appears to have Medicare + possible BCBS [place] supplemental plan (?)  ATC patient to confirm. If so, it would likely be more cost effective for patient to receive at [facility] since Medicare would cover 80% of cost and supplemental picks up 20% of cost.  Left VM requesting return call  Chesley Mires, PharmD, MPH, BCPS, CPP Clinical Pharmacist (Rheumatology and Pulmonology)

## 2023-11-05 NOTE — Progress Notes (Signed)
Therapy plan placed for Tezspire SQ [phone]) for [facility] to start benefits investigation  Diagnosis: severe persistent asthma  Provider: Dr. Judeth Horn  Dose: 210mg  every 4 weeks  Last Clinic Visit: 11/03/2023 Next Clinic Visit: 02/03/2024  Pertinent Labs: absolute eos of 100 on 08/19/2023 so not a candidate for Eliberto Ivory. IgE has never been checked  Chesley Mires, PharmD, MPH, BCPS, CPP Clinical Pharmacist (Rheumatology and Pulmonology)

## 2023-11-06 ENCOUNTER — Telehealth: Payer: Self-pay | Admitting: Pharmacy Technician

## 2023-11-06 DIAGNOSIS — M6281 Muscle weakness (generalized): Secondary | ICD-10-CM | POA: Diagnosis not present

## 2023-11-06 NOTE — Telephone Encounter (Signed)
Fyi note:  Auth Submission: NO AUTH NEEDED Site of care: Site of care: [facility] Payer: MEDICARE A/B & BCBS SUPP Medication & CPT/J Code(s) submitted: Tezspire Darral Dash) [phone] Route of submission (phone, fax, portal):  Phone # Fax # Auth type: Buy/Bill HB Units/visits requested: 210MG  [id number] Reference number:  Approval from: 11/06/23 to 12/23/23

## 2023-11-07 DIAGNOSIS — R490 Dysphonia: Secondary | ICD-10-CM | POA: Diagnosis not present

## 2023-11-07 DIAGNOSIS — R41841 Cognitive communication deficit: Secondary | ICD-10-CM | POA: Diagnosis not present

## 2023-11-10 DIAGNOSIS — R41841 Cognitive communication deficit: Secondary | ICD-10-CM | POA: Diagnosis not present

## 2023-11-10 DIAGNOSIS — R2689 Other abnormalities of gait and mobility: Secondary | ICD-10-CM | POA: Diagnosis not present

## 2023-11-10 DIAGNOSIS — R490 Dysphonia: Secondary | ICD-10-CM | POA: Diagnosis not present

## 2023-11-10 DIAGNOSIS — M6281 Muscle weakness (generalized): Secondary | ICD-10-CM | POA: Diagnosis not present

## 2023-11-11 DIAGNOSIS — M6281 Muscle weakness (generalized): Secondary | ICD-10-CM | POA: Diagnosis not present

## 2023-11-12 ENCOUNTER — Encounter: Payer: Self-pay | Admitting: Physical Medicine & Rehabilitation

## 2023-11-12 ENCOUNTER — Encounter: Payer: Medicare Other | Attending: Physical Medicine & Rehabilitation | Admitting: Physical Medicine & Rehabilitation

## 2023-11-12 ENCOUNTER — Other Ambulatory Visit: Payer: Self-pay | Admitting: Physical Medicine & Rehabilitation

## 2023-11-12 VITALS — BP 99/63 | HR 65 | Ht 64.0 in

## 2023-11-12 DIAGNOSIS — M1711 Unilateral primary osteoarthritis, right knee: Secondary | ICD-10-CM | POA: Diagnosis not present

## 2023-11-12 DIAGNOSIS — M5416 Radiculopathy, lumbar region: Secondary | ICD-10-CM

## 2023-11-12 DIAGNOSIS — R2689 Other abnormalities of gait and mobility: Secondary | ICD-10-CM | POA: Diagnosis not present

## 2023-11-12 DIAGNOSIS — M1712 Unilateral primary osteoarthritis, left knee: Secondary | ICD-10-CM | POA: Insufficient documentation

## 2023-11-12 DIAGNOSIS — M6281 Muscle weakness (generalized): Secondary | ICD-10-CM | POA: Diagnosis not present

## 2023-11-12 MED ORDER — BUPRENORPHINE 10 MCG/HR TD PTWK: 1.0000 | MEDICATED_PATCH | TRANSDERMAL | 3 refills | Status: DC

## 2023-11-12 MED ORDER — TRIAMCINOLONE ACETONIDE 32 MG IX SRER
64.0000 mg | Freq: Once | INTRA_ARTICULAR | Status: AC
  Administered 2023-11-12: 64 mg via INTRA_ARTICULAR

## 2023-11-12 MED ORDER — OXYCODONE-ACETAMINOPHEN 10-325 MG PO TABS: 1.0000 | ORAL_TABLET | Freq: Four times a day (QID) | ORAL | 0 refills | Status: DC | PRN

## 2023-11-12 NOTE — Progress Notes (Deleted)
OFFICE VISIT  11/12/2023  CC: No chief complaint on file.   Patient is a 77 y.o. female who presents for 2 wk f/u asthma. A/P as of last visit: "Poorly controlled asthma in the setting of pleuritic and parenchymal pulmonary asbestosis. Will discontinue Breztri. Start budesonide 0.5 mg nebulizer twice daily and formoterol 20 mg nebulizer twice daily. Restart low-dose prednisone for the next 3 weeks: 10 mg x 10 days, then 5 mg x 10 days. Albuterol nebulizer 2.5 mg every 6 hours as needed. Continue 3 L oxygen at rest and 4 L with activity. Obtain chest radiograph to see if any sign of new focal infiltrate.--ordered today. No antibiotics at this time."  INTERIM HX: ***   Past Medical History:  Diagnosis Date   Acute upper GI bleed 06/2014   while pt taking coumadin, plavix, and meloxicam---despite being told not to take coumadin.   Anginal pain (HCC)    Nonobstructive CAD 2014; however, her cardiologist put her on a statin for this and NOT for hyperlipidemia per pt report.  Atyp CP 08/2017 at card f/u, plan for myoc perf imaging.   Anxiety    panic attacks   BPPV (benign paroxysmal positional vertigo) 12/16/2012   CAD (coronary artery disease)    Nonobstructive ([facility])   Chronic diastolic CHF (congestive heart failure) (HCC)    dry wt as of 11/06/16 is 168 lbs.   Chronic lower back pain    DDD (degenerative disc disease)    lumbar and cervical.    Diverticular disease    Fibromyalgia    Patient states dx was around her late 16s but she had sx's for years prior to this.   H/O hiatal hernia    History of pneumonia    hospitalized 12/2011, 02/2013, and 07/2013 [facility]) for this   HTN (hypertension)    Renal artery dopplers 04/2013 neg for stenosis.   Hypervitaminosis D 09/27/2019   over-supplemented.  Stopped vit D and plan recheck 2 mo.   Idiopathic angio-edema-urticaria 16109; 2021   Angioedema component was very minimal.  2021->Dr. Bobbitt (allergist) eval.   Insomnia     Iron deficiency anemia    Hematologist in [place], [place] did extensive w/u; no cause found; failed oral supplement;; gets fairly regular (q59m or so) IV iron infusions (Venofer -iron sucrose- 200mg  with procrit.  "for 14 yr I've been getting blood work q month & getting infusions prn" (07/12/2013).  Dr. Myna Hidalgo locally, iron infusions done, EPO deficiency dx'd   Migraine syndrome    "not as often anymore; used to be ~ q wk" (07/12/2013)   Mixed incontinence urge and stress    Nephrolithiasis    "passed all on my own or they are still in there" (07/12/2013)   Neuroleptic induced parkinsonism (HCC) 2018   Dr. Arbutus Leas, neuro, saw her 11/24/17 and recommended d/c of abilify as first step.  D/c'd abilify and pt got complete recovery.   Oropharyngeal dysphagia    swallowing study speech path 05/2020. Gastric bx's showed gastritis, h pylori NEG   OSA on CPAP    prior to move to [place]--had another sleep study 10/2015 w/pulm Dr. Su Monks.   Osteoarthritis    "severe; progressing fast" (07/12/2013); multiple joints-not surgical candidate for TKR (03/2015).  Triamcinolon knee injections by Dr. Hermelinda Medicus 12/2017.   Pernicious anemia 08/24/2014   Pleural plaque with presence of asbestos 07/22/2013   Pulmonary embolism (HCC) 07/2013   Dx at [facility] with very small peripheral upper lobe pe 07/2013: pt took coumadin  for about 8-9 mo   Pyelonephritis    "several times over the last 30 yr" (07/12/2013)   RBBB (right bundle branch block)    Recurrent major depression (HCC)    Recurrent UTI    hx of hospitalization for pyelonephritis; started abx prophylaxis 06/2015   Syncope    Hypotensive; ED visit--Dr. Sharyn Lull did Cath--nonobstructive CAD, EF 55-60%.  In retrospect, suspect pt rx med misuse/polypharmacy   Vertebral compression fracture (HCC)    Acute T12 on 02/27/22 (fall).  multiple old thoracic-->neurosurg to do MRI as of 05/2022    Past Surgical History:  Procedure Laterality Date   APPENDECTOMY  1960   AXILLARY SURGERY Left  1978   Multiple "lump" in armpit per pt   BIOPSY  06/17/2020   Gastric bx->gastritis, h pylori neg.  Procedure: BIOPSY;  Surgeon: Jeani Hawking, MD;  Location: [facility];  Service: Endoscopy;;   CARDIAC CATHETERIZATION  01/2013   nonobstructive CAD, EF 55-60%   CARDIOVASCULAR STRESS TEST  02/22/2015   Low risk myocard perf imaging; wall motion normal, normal EF   CARDIOVASCULAR STRESS TEST     09/2017 myo perf low risk   carotid duplex doppler  10/21/2017   01/2023 1-30% bilat-->rpt 1 yr   COCCYX REMOVAL  1972   CT CTA CORONARY W/CA SCORE W/CM &/OR WO/CM     04/02/23 96%'tile calcium score, nonobst CAD-->crestor increased to 40   DEXA  06/05/2017   T-score -3.1   DILATION AND CURETTAGE OF UTERUS  ? 1970's   ESOPHAGOGASTRODUODENOSCOPY N/A 07/19/2014   Gastritis found + in the setting of supratherapeutic INR, +plavix, + meloxicam.   ESOPHAGOGASTRODUODENOSCOPY (EGD) WITH PROPOFOL N/A 06/17/2020   NO stricture or other prob to explain pt's dysphagia, dilation was done anyway.  Gastric bx's-->gastritis, h pylori neg. Procedure: ESOPHAGOGASTRODUODENOSCOPY (EGD) WITH PROPOFOL;  Surgeon: Jeani Hawking, MD;  Location: [facility];  Service: Endoscopy;  Laterality: N/A;   EYE SURGERY Left 2012-2013   "injections for ~ 1 yr; don't really know what for" (07/12/2013)   HEEL SPUR SURGERY Left 2008   kidney stone removal Right    KNEE SURGERY  2005   LEFT HEART CATHETERIZATION WITH CORONARY ANGIOGRAM N/A 01/30/2013   Procedure: LEFT HEART CATHETERIZATION WITH CORONARY ANGIOGRAM;  Surgeon: Robynn Pane, MD;  Location: [facility];  Service: Cardiovascular;  Laterality: N/A;   MALONEY DILATION  06/17/2020   Procedure: Elease Hashimoto DILATION;  Surgeon: Jeani Hawking, MD;  Location: [facility];  Service: Endoscopy;;   nasolaryngoscopy     06/2023 during hosp for resp failure-->normal   PLANTAR FASCIA RELEASE Left 2008   SPIROMETRY  04/25/2014   In hosp for acute asthma/COPD flare: mixed obstructive  and restrictive lung disease. The FEV1 is severely reduced at 45% predicted.  FEV1 signif decreased compared to prior spirometry 07/23/13.   TENDON RELEASE  1996   Right forearm and hand   TOTAL ABDOMINAL HYSTERECTOMY  1974   TRANSTHORACIC ECHOCARDIOGRAM  01/2013; 04/2014;08/2015; 09/2017   2014--NORMAL.  2015--focal basal septal hypertrophy, EF 55-60%, grade I diast dysfxn, mild LAE.  08/2015 EF 55-60%, nl LV syst fxn, grade I DD, valves wnl. 10/21/17: EF 65-70%, grd I DD, o/w normal. 02/17/22 EF 70-75%, hyperdynamic LV fxn, grd I DD.    Outpatient Medications Prior to Visit  Medication Sig Dispense Refill   albuterol (PROVENTIL) (2.5 MG/3ML) 0.083% nebulizer solution Take 3 mLs (2.5 mg total) by nebulization every 6 (six) hours as needed for wheezing or shortness of breath. 75 mL  3   albuterol (VENTOLIN HFA) 108 (90 Base) MCG/ACT inhaler Inhale 1-2 puffs into the lungs every 6 (six) hours as needed for wheezing or shortness of breath. 18 g 5   ALPRAZolam (XANAX) 0.5 MG tablet TAKE ONE TABLET BY MOUTH THREE TIMES DAILY 90 tablet 5   amLODipine (NORVASC) 5 MG tablet TAKE ONE TABLET BY MOUTH DAILY 90 tablet 3   aspirin 81 MG tablet Take 81 mg by mouth at bedtime.     benzonatate (TESSALON) 200 MG capsule Take 1 capsule (200 mg total) by mouth 3 (three) times daily as needed. 45 capsule 3   budesonide (PULMICORT) 0.5 MG/2ML nebulizer solution Take 2 mLs (0.5 mg total) by nebulization in the morning and at bedtime. 120 mL 11   buprenorphine (BUTRANS) 10 MCG/HR PTWK Place 1 patch onto the skin once a week. 4 patch 3   cyclobenzaprine (FLEXERIL) 5 MG tablet Take 5 mg by mouth 2 (two) times daily as needed.     diclofenac Sodium (VOLTAREN) 1 % GEL Apply 2 g topically 4 (four) times daily. 4 times a day to both knees (Patient taking differently: Apply 2 g topically 4 (four) times daily as needed (for pain- to affected areas).) 1000 each 4   DULoxetine (CYMBALTA) 60 MG capsule Take 1 capsule (60 mg total) by  mouth daily. 90 capsule 3   EPINEPHrine 0.3 mg/0.3 mL IJ SOAJ injection Inject 0.3 mg into the muscle as needed for anaphylaxis. 1 each 1   fluticasone (FLONASE) 50 MCG/ACT nasal spray Place 1 spray into both nostrils at bedtime. 16 g 0   formoterol (PERFOROMIST) 20 MCG/2ML nebulizer solution Take 2 mLs (20 mcg total) by nebulization 2 (two) times daily. 120 mL 11   furosemide (LASIX) 20 MG tablet Take 1 tablet (20 mg total) by mouth daily. 30 tablet 0   gabapentin (NEURONTIN) 300 MG capsule Take 1 capsule (300 mg total) by mouth 2 (two) times daily. 180 capsule 3   ipratropium (ATROVENT) 0.03 % nasal spray Place 2 sprays into both nostrils every 12 (twelve) hours as needed (allergies). 30 mL 5   isosorbide mononitrate (IMDUR) 30 MG 24 hr tablet Take 2 tablets (60 mg total) by mouth in the morning and at bedtime. 270 tablet 2   lamoTRIgine (LAMICTAL) 200 MG tablet TAKE ONE TABLET BY MOUTH DAILY 90 tablet 1   loratadine (CLARITIN) 10 MG tablet Take 1 tablet (10 mg total) by mouth daily as needed for allergies or rhinitis. 90 tablet 0   methocarbamol (ROBAXIN) 500 MG tablet Take 1 tablet (500 mg total) by mouth every 8 (eight) hours as needed for muscle spasms. 60 tablet 2   metoprolol succinate (TOPROL-XL) 100 MG 24 hr tablet take ONE tab daily, take additional 1/2 tab as needed FOR FOR BLOOD PRESSURE > 160/90 (Patient taking differently: Take 50-100 mg by mouth See admin instructions. Take 100 mg by mouth once a day and an additional 50 mg as needed for a B/P greater than 160/90) 135 tablet 1   montelukast (SINGULAIR) 10 MG tablet Take 1 tablet (10 mg total) by mouth at bedtime. 90 tablet 3   Multiple Vitamin (MULTIVITAMIN WITH MINERALS) TABS tablet Take 1 tablet by mouth daily with breakfast.     nitroGLYCERIN (NITROSTAT) 0.4 MG SL tablet Place 1 tablet (0.4 mg total) under the tongue every 5 (five) minutes as needed for chest pain (x 3 doses). 25 tablet 11   oxyCODONE-acetaminophen (PERCOCET) 10-325  MG tablet Take 1 tablet  by mouth every 6 (six) hours as needed for pain. 120 tablet 0   OXYGEN Inhale 3 L/min into the lungs continuous.     pantoprazole (PROTONIX) 40 MG tablet TAKE ONE TABLET BY MOUTH TWICE DAILY 180 tablet 1   predniSONE (DELTASONE) 5 MG tablet 2 tabs po every day x 10d then 1 tab po every day x 10d 30 tablet 0   rOPINIRole (REQUIP) 0.5 MG tablet TAKE ONE TABLET BY MOUTH AT BEDTIME (Patient taking differently: Take 0.5 mg by mouth at bedtime as needed. TAKE ONE TABLET BY MOUTH AT BEDTIME) 90 tablet 1   rosuvastatin (CRESTOR) 40 MG tablet Take 1 tablet (40 mg total) by mouth every evening. 90 tablet 3   solifenacin (VESICARE) 10 MG tablet Take 1 tablet (10 mg total) by mouth daily. 30 tablet 2   Spacer/Aero-Holding Chambers DEVI 1 puff by Does not apply route 2 (two) times daily. 1 each 1   traZODone (DESYREL) 50 MG tablet TAKE 2-4 TABLETS BY MOUTH AT BEDTIME AS NEEDED INSOMNIA 120 tablet 2   No facility-administered medications prior to visit.    Allergies  Allergen Reactions   Aripiprazole Rash and Other (See Comments)    Parkinsonism, also   Penicillins Shortness Of Breath, Itching, Swelling, Rash and Other (See Comments)    Tolerated Cefepime in ED   Bactrim [Sulfamethoxazole-Trimethoprim] Rash    Review of Systems As per HPI  PE:    11/12/2023    1:29 PM 11/03/2023    1:07 PM 10/29/2023    1:11 PM  Vitals with BMI  Height 5\' 4"  5\' 4"    Weight  163 lbs 10 oz 164 lbs 13 oz  BMI  28.07   Systolic 99 118 127  Diastolic 63 62 72  Pulse 65 72 70     Physical Exam  ***  LABS:  Last CBC Lab Results  Component Value Date   WBC 10.6 (H) 08/19/2023   HGB 12.4 08/19/2023   HCT 38.5 08/19/2023   MCV 88.7 08/19/2023   MCH 28.3 07/16/2023   RDW 14.8 08/19/2023   PLT 223.0 08/19/2023   Last metabolic panel Lab Results  Component Value Date   GLUCOSE 96 10/15/2023   NA 139 10/15/2023   K 4.1 10/15/2023   CL 101 10/15/2023   CO2 31 10/15/2023    BUN 16 10/15/2023   CREATININE 0.90 10/15/2023   GFR 61.64 10/15/2023   CALCIUM 9.1 10/15/2023   PHOS 4.0 02/27/2022   PROT 7.0 07/16/2023   ALBUMIN 3.5 07/16/2023   BILITOT 0.5 07/16/2023   ALKPHOS 58 07/16/2023   AST 22 07/16/2023   ALT 29 07/16/2023   ANIONGAP 9 07/16/2023   Last hemoglobin A1c Lab Results  Component Value Date   HGBA1C 5.3 02/27/2022   IMPRESSION AND PLAN:  No problem-specific Assessment & Plan notes found for this encounter.  No labs needed***  An After Visit Summary was printed and given to the patient.  FOLLOW UP: No follow-ups on file.  Signed:  Santiago Bumpers, MD           11/12/2023

## 2023-11-12 NOTE — Patient Instructions (Signed)
ALWAYS FEEL FREE TO CALL OUR OFFICE WITH ANY PROBLEMS OR QUESTIONS ([phone])  **PLEASE NOTE** ALL MEDICATION REFILL REQUESTS (INCLUDING CONTROLLED SUBSTANCES) NEED TO BE MADE AT LEAST 7 DAYS PRIOR TO REFILL BEING DUE. ANY REFILL REQUESTS INSIDE THAT TIME FRAME MAY RESULT IN DELAYS IN RECEIVING YOUR PRESCRIPTION.                    

## 2023-11-12 NOTE — Progress Notes (Signed)
PROCEDURE NOTE  DIAGNOSIS: Primary osteoarthritis of right knee - Plan: Triamcinolone Acetonide (ZILRETTA) intra-articular injection 64 mg  Primary osteoarthritis of left knee - Plan: Triamcinolone Acetonide (ZILRETTA) intra-articular injection 64 mg  INTERVENTION:  major joint injections     After informed consent and preparation of the skin with betadine and isopropyl alcohol, I injected 6mg  (1cc) of celestone and 4cc of 1% lidocaine into the bilateral knees via anterolateral approach. Additionally, aspiration was performed prior to injection. The patient tolerated well, and no complications were encountered. Afterward the area was cleaned and dressed. Post- injection instructions were provided.      Ranelle Oyster, MD, [facility] Physical Medicine & Rehabilitation 11/12/2023

## 2023-11-13 ENCOUNTER — Other Ambulatory Visit: Payer: Self-pay | Admitting: Family Medicine

## 2023-11-13 ENCOUNTER — Ambulatory Visit: Payer: Medicare Other | Admitting: Family Medicine

## 2023-11-13 DIAGNOSIS — M6281 Muscle weakness (generalized): Secondary | ICD-10-CM | POA: Diagnosis not present

## 2023-11-13 NOTE — Telephone Encounter (Signed)
Pt has appt today

## 2023-11-13 NOTE — Telephone Encounter (Signed)
Med pending.

## 2023-11-14 DIAGNOSIS — R41841 Cognitive communication deficit: Secondary | ICD-10-CM | POA: Diagnosis not present

## 2023-11-14 DIAGNOSIS — R490 Dysphonia: Secondary | ICD-10-CM | POA: Diagnosis not present

## 2023-11-15 ENCOUNTER — Other Ambulatory Visit: Payer: Self-pay | Admitting: Family Medicine

## 2023-11-17 ENCOUNTER — Other Ambulatory Visit: Payer: Self-pay | Admitting: Family Medicine

## 2023-11-17 DIAGNOSIS — R2689 Other abnormalities of gait and mobility: Secondary | ICD-10-CM | POA: Diagnosis not present

## 2023-11-17 DIAGNOSIS — M6281 Muscle weakness (generalized): Secondary | ICD-10-CM | POA: Diagnosis not present

## 2023-11-18 DIAGNOSIS — R41841 Cognitive communication deficit: Secondary | ICD-10-CM | POA: Diagnosis not present

## 2023-11-18 DIAGNOSIS — M6281 Muscle weakness (generalized): Secondary | ICD-10-CM | POA: Diagnosis not present

## 2023-11-18 DIAGNOSIS — R490 Dysphonia: Secondary | ICD-10-CM | POA: Diagnosis not present

## 2023-11-18 NOTE — Progress Notes (Signed)
Tezspire scheduled for 11/25/2023

## 2023-11-19 DIAGNOSIS — R2689 Other abnormalities of gait and mobility: Secondary | ICD-10-CM | POA: Diagnosis not present

## 2023-11-19 DIAGNOSIS — M6281 Muscle weakness (generalized): Secondary | ICD-10-CM | POA: Diagnosis not present

## 2023-11-24 DIAGNOSIS — R41841 Cognitive communication deficit: Secondary | ICD-10-CM | POA: Diagnosis not present

## 2023-11-24 DIAGNOSIS — M6281 Muscle weakness (generalized): Secondary | ICD-10-CM | POA: Diagnosis not present

## 2023-11-24 DIAGNOSIS — R490 Dysphonia: Secondary | ICD-10-CM | POA: Diagnosis not present

## 2023-11-24 DIAGNOSIS — R2689 Other abnormalities of gait and mobility: Secondary | ICD-10-CM | POA: Diagnosis not present

## 2023-11-25 ENCOUNTER — Ambulatory Visit: Payer: Medicare Other | Admitting: *Deleted

## 2023-11-25 VITALS — BP 162/87 | HR 57 | Temp 98.0°F | Resp 16 | Ht 64.0 in | Wt 156.0 lb

## 2023-11-25 DIAGNOSIS — J4550 Severe persistent asthma, uncomplicated: Secondary | ICD-10-CM

## 2023-11-25 MED ORDER — TEZEPELUMAB-EKKO 210 MG/1.91ML ~~LOC~~ SOSY
210.0000 mg | PREFILLED_SYRINGE | Freq: Once | SUBCUTANEOUS | Status: AC
  Administered 2023-11-25: 210 mg via SUBCUTANEOUS
  Filled 2023-11-25: qty 1.91

## 2023-11-25 NOTE — Progress Notes (Signed)
Diagnosis: Asthma  Provider:  Chilton Greathouse MD  Procedure: Injection  Tezspire (Tezepelumab), Dose: 210 mg, Site: subcutaneous, Number of injections: 1  Post Care: Observation period completed  Discharge: Condition: Good, Destination: Home . AVS Provided  Performed by:  Forrest Moron, RN    Approximately 40 minutes after receiving the injection, patient began to complain of pain in her neck and back. Stated that the pain "settled" in her lower back, but that she did experience shooting pains. Stated the pain was approximately 5-6/10 at baseline. SecureChat sent to Dr. Judeth Horn (ordering provider) and Chesley Mires, RPH-CPP noting symptoms. Devki G. Responded that, in absence of symptoms of allergic reaction, ok to discharge patient today with instructions to follow up with primary care provider if pain does not resolve. Instructions were relayed to patient and attending family member, who verbalized understanding and stated they were agreeable to discharge today.  Wyvonne Lenz, RN

## 2023-11-25 NOTE — Patient Instructions (Signed)
Tezepelumab Injection What is this medication? TEZEPELUMAB (TEZ e PEL ue mab) prevents and treats the symptoms of asthma. It works by decreasing inflammation of the airways, making it easier to breathe. It is prescribed when other asthma medications have not worked well enough. Do not use it to treat a sudden asthma attack. It is a monoclonal antibody. This medicine may be used for other purposes; ask your health care provider or pharmacist if you have questions. COMMON BRAND NAME(S): TEZSPIRE What should I tell my care team before I take this medication? They need to know if you have any of these conditions: Parasitic (helminth) infection An unusual or allergic reaction to tezepelumab, hamster proteins, other medications, foods, dyes, or preservatives Pregnant or trying to get pregnant Breast-feeding How should I use this medication? This medication is injected under the skin. It is given by your care team in a hospital or clinic setting. It may also be given at home. If you get this medication at home, you will be taught how to prepare and give it. Use exactly as directed. Take it as directed on the prescription label at the same time every day. Keep taking it unless your care team tells you to stop If you use a pen, be sure to take off the outer needle cover before using the dose. It is important that you put your used needles and syringes in a special sharps container. Do not put them in a trash can. If you do not have a sharps container, call your pharmacist or care team to get one. Talk to your care team about the use of this medication in children. While it may be prescribed for children as young as 12 years for selected conditions, precautions do apply. Overdosage: If you think you have taken too much of this medicine contact a poison control center or emergency room at once. NOTE: This medicine is only for you. Do not share this medicine with others. What if I miss a dose? Keep  appointments for follow-up doses. It is important not to miss your dose. Call your care team if you are unable to keep an appointment. What may interact with this medication? Interactions are not expected. This list may not describe all possible interactions. Give your health care provider a list of all the medicines, herbs, non-prescription drugs, or dietary supplements you use. Also tell them if you smoke, drink alcohol, or use illegal drugs. Some items may interact with your medicine. What should I watch for while using this medication? Visit your care team for regular checks on your progress. Tell your care team if your symptoms do not start to get better or if they get worse. Talk to your care team about how to treat an acute asthma attack or bronchospasm (wheezing). Be sure to always have a short-acting inhaler with you. If you use your short-acting inhaler and your symptoms do not get better or if they get worse, call your care team right away. You and your care team should develop an Asthma Action Plan that is just for you. Be sure to know what to do if you are in the yellow (asthma is getting worse) or red (medical alert) zones. What side effects may I notice from receiving this medication? Side effects that you should report to your care team as soon as possible: Allergic reactions--skin rash, itching, hives, swelling of the face, lips, tongue, or throat Side effects that usually do not require medical attention (report these to your care team  if they continue or are bothersome): Back pain Joint pain Sore throat This list may not describe all possible side effects. Call your doctor for medical advice about side effects. You may report side effects to FDA at 1-800-FDA-1088. Where should I keep my medication? Keep out of the reach of children and pets. This medication may be given in a hospital or clinic. If it is used at home, store in a refrigerator or at room temperature between 20 and  25 degrees C (68 and 77 degrees F). Refrigeration (preferred): Store it in the refrigerator. Keep it in the original carton until you are ready to take it. Get rid of any unused medication after the expiration date. Room Temperature: This medication may be stored at room temperature for up to 30 days. Do not put the medication back into the refrigerator once it has reached room temperature. If it is stored at room temperature, get rid of any unused medication after 30 days or after it expires, whichever is first. NOTE: This sheet is a summary. It may not cover all possible information. If you have questions about this medicine, talk to your doctor, pharmacist, or health care provider.  2024 Elsevier/Gold Standard (2022-02-21 00:00:00)

## 2023-11-26 DIAGNOSIS — R2689 Other abnormalities of gait and mobility: Secondary | ICD-10-CM | POA: Diagnosis not present

## 2023-11-26 DIAGNOSIS — M6281 Muscle weakness (generalized): Secondary | ICD-10-CM | POA: Diagnosis not present

## 2023-11-27 DIAGNOSIS — M6281 Muscle weakness (generalized): Secondary | ICD-10-CM | POA: Diagnosis not present

## 2023-11-28 ENCOUNTER — Telehealth: Payer: Self-pay

## 2023-11-28 DIAGNOSIS — R490 Dysphonia: Secondary | ICD-10-CM | POA: Diagnosis not present

## 2023-11-28 DIAGNOSIS — R41841 Cognitive communication deficit: Secondary | ICD-10-CM | POA: Diagnosis not present

## 2023-11-28 NOTE — Telephone Encounter (Signed)
Patient called stating she needs a refill on pain med, runs out on the 15th , appt on the 19th.

## 2023-12-01 DIAGNOSIS — R2689 Other abnormalities of gait and mobility: Secondary | ICD-10-CM | POA: Diagnosis not present

## 2023-12-01 DIAGNOSIS — M6281 Muscle weakness (generalized): Secondary | ICD-10-CM | POA: Diagnosis not present

## 2023-12-01 NOTE — Addendum Note (Signed)
Addended by: Murrell Redden on: 12/01/2023 02:56 PM   Modules accepted: Orders

## 2023-12-02 ENCOUNTER — Encounter: Payer: Self-pay | Admitting: Family Medicine

## 2023-12-02 DIAGNOSIS — R490 Dysphonia: Secondary | ICD-10-CM | POA: Diagnosis not present

## 2023-12-02 DIAGNOSIS — M6281 Muscle weakness (generalized): Secondary | ICD-10-CM | POA: Diagnosis not present

## 2023-12-02 DIAGNOSIS — R41841 Cognitive communication deficit: Secondary | ICD-10-CM | POA: Diagnosis not present

## 2023-12-03 ENCOUNTER — Telehealth: Payer: Self-pay

## 2023-12-03 DIAGNOSIS — M6281 Muscle weakness (generalized): Secondary | ICD-10-CM | POA: Diagnosis not present

## 2023-12-03 DIAGNOSIS — R2689 Other abnormalities of gait and mobility: Secondary | ICD-10-CM | POA: Diagnosis not present

## 2023-12-03 NOTE — Telephone Encounter (Signed)
Left message

## 2023-12-03 NOTE — Telephone Encounter (Signed)
I called  the pharmacy. Pain medication can be picked up on 12/05/2023. Patient informed.

## 2023-12-03 NOTE — Telephone Encounter (Signed)
Task completed. Patient informed of refill on 12/05/2023.

## 2023-12-04 DIAGNOSIS — M6281 Muscle weakness (generalized): Secondary | ICD-10-CM | POA: Diagnosis not present

## 2023-12-05 DIAGNOSIS — R41841 Cognitive communication deficit: Secondary | ICD-10-CM | POA: Diagnosis not present

## 2023-12-05 DIAGNOSIS — R490 Dysphonia: Secondary | ICD-10-CM | POA: Diagnosis not present

## 2023-12-08 DIAGNOSIS — M6281 Muscle weakness (generalized): Secondary | ICD-10-CM | POA: Diagnosis not present

## 2023-12-08 DIAGNOSIS — R2689 Other abnormalities of gait and mobility: Secondary | ICD-10-CM | POA: Diagnosis not present

## 2023-12-09 ENCOUNTER — Telehealth: Payer: Self-pay | Admitting: Pulmonary Disease

## 2023-12-09 DIAGNOSIS — R41841 Cognitive communication deficit: Secondary | ICD-10-CM | POA: Diagnosis not present

## 2023-12-09 DIAGNOSIS — R490 Dysphonia: Secondary | ICD-10-CM | POA: Diagnosis not present

## 2023-12-09 NOTE — Telephone Encounter (Signed)
Her POC is not working. [employer] told her to call us and tell us what exactlywhat is happening and send a new RX.  She says "I can not turn it on."   Needs ASAP

## 2023-12-10 NOTE — Telephone Encounter (Signed)
We will need an order there is nothing PCC's can do with out one.

## 2023-12-10 NOTE — Telephone Encounter (Signed)
Order placed for POC on 09/04/23.   Spoke to [place] with [employer]. He stated that patient received POC on 9/18. He is not sure why patient was instructed to contact us. Mitch to going to reach out to the local branch.   Lm for patient.

## 2023-12-11 ENCOUNTER — Encounter: Payer: Medicare Other | Attending: Physical Medicine & Rehabilitation | Admitting: Registered Nurse

## 2023-12-11 ENCOUNTER — Encounter: Payer: Self-pay | Admitting: Registered Nurse

## 2023-12-11 VITALS — BP 131/67 | HR 74 | Ht 64.0 in | Wt 154.6 lb

## 2023-12-11 DIAGNOSIS — G894 Chronic pain syndrome: Secondary | ICD-10-CM

## 2023-12-11 DIAGNOSIS — M25512 Pain in left shoulder: Secondary | ICD-10-CM | POA: Insufficient documentation

## 2023-12-11 DIAGNOSIS — Z5181 Encounter for therapeutic drug level monitoring: Secondary | ICD-10-CM | POA: Diagnosis not present

## 2023-12-11 DIAGNOSIS — M1711 Unilateral primary osteoarthritis, right knee: Secondary | ICD-10-CM

## 2023-12-11 DIAGNOSIS — M1712 Unilateral primary osteoarthritis, left knee: Secondary | ICD-10-CM

## 2023-12-11 DIAGNOSIS — M546 Pain in thoracic spine: Secondary | ICD-10-CM | POA: Diagnosis not present

## 2023-12-11 DIAGNOSIS — Z79899 Other long term (current) drug therapy: Secondary | ICD-10-CM | POA: Diagnosis not present

## 2023-12-11 DIAGNOSIS — G8929 Other chronic pain: Secondary | ICD-10-CM

## 2023-12-11 DIAGNOSIS — M6281 Muscle weakness (generalized): Secondary | ICD-10-CM | POA: Diagnosis not present

## 2023-12-11 DIAGNOSIS — M25511 Pain in right shoulder: Secondary | ICD-10-CM | POA: Diagnosis not present

## 2023-12-11 DIAGNOSIS — M5416 Radiculopathy, lumbar region: Secondary | ICD-10-CM

## 2023-12-11 DIAGNOSIS — M47816 Spondylosis without myelopathy or radiculopathy, lumbar region: Secondary | ICD-10-CM

## 2023-12-11 MED ORDER — OXYCODONE-ACETAMINOPHEN 10-325 MG PO TABS: 1.0000 | ORAL_TABLET | Freq: Four times a day (QID) | ORAL | 0 refills | Status: DC | PRN

## 2023-12-11 NOTE — Progress Notes (Signed)
Subjective:    Patient ID: Linda [name], female    DOB: 14-Jan-1946, 77 y.o.   MRN: [id number]  HPI: Linda [name] is a 77 y.o. female who returns for follow up appointment for chronic pain and medication refill. She states her pain is located in her bilateral shoulders, mid- lower back radiating into her right lower extremity. She also reports bilateral knee pain. She rates her pain 7. Her current exercise regime is walking and performing stretching exercises.  Ms. Bilson Morphine equivalent is 60.00 MME. She is also prescribed Alprazolam  by Dr. Milinda Cave .We have discussed the black box warning of using opioids and benzodiazepines. I highlighted the dangers of using these drugs together and discussed the adverse events including respiratory suppression, overdose, cognitive impairment and importance of compliance with current regimen. We will continue to monitor and adjust as indicated.     Last Oral Swab was Performed on 10/07/2023, it was consistent.     Pain Inventory Average Pain 6 Pain Right Now 7 My pain is intermittent, sharp, and aching  In the last 24 hours, has pain interfered with the following? General activity 7 Relation with others 8 Enjoyment of life 8 What TIME of day is your pain at its worst? morning , daytime, evening, and night Sleep (in general) Fair  Pain is worse with: walking, bending, and standing Pain improves with: rest, medication, and injections Relief from Meds: 5  Family History  Problem Relation Age of Onset   Arthritis Mother    Kidney disease Mother    Heart disease Father    Stroke Father    Hypertension Father    Diabetes Father    Heart attack Father    Heart attack Paternal Grandmother    Diabetes Sister        one sister   Hypertension Sister    Asthma Sister    Hypertension Brother    Asthma Brother    Asthma Daughter    Multiple sclerosis Son    Social History   Socioeconomic History   Marital status: Widowed     Spouse name: Not on file   Number of children: 2   Years of education: Not on file   Highest education level: Not on file  Occupational History   Occupation: Retired    Comment: Runner, 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  Relocated to [place] 09/2012 to be closer to her son who has MS.   Husband d 2015--mesothelioma.   Occupation: former Runner, broadcasting/film/video.   Education: masters degree level.   No T/A/Ds.   Lives in [place], independent living.   Social Drivers of Corporate investment banker Strain: Low Risk  (01/01/2023)   Overall Financial Resource Strain (CARDIA)    Difficulty of Paying Living Expenses: Not hard at all  Food Insecurity: No Food Insecurity (07/07/2023)   Hunger Vital Sign    Worried About Running Out of Food in the Last Year: Never true    Ran Out of Food in the Last Year: Never true  Transportation Needs: No Transportation Needs (07/07/2023)   PRAPARE - Administrator, Civil Service (Medical): No    Lack of Transportation (Non-Medical): No  Physical Activity: Insufficiently Active (01/01/2023)   Exercise Vital Sign    Days of Exercise per Week: 2 days    Minutes of Exercise per Session: 20 min  Stress: No Stress Concern Present (01/01/2023)   [employer] of Occupational Health - Occupational Stress Questionnaire    Feeling of Stress : Not at all  Social Connections: Moderately Isolated (01/01/2023)   Social Connection and Isolation Panel [NHANES]    Frequency of Communication with Friends and Family: Three times a week    Frequency of Social Gatherings with Friends and Family: More than three times a  week    Attends Religious Services: More than 4 times per year    Active Member of Clubs or Organizations: No    Attends Banker Meetings: Never    Marital Status: Widowed   Past Surgical History:  Procedure Laterality Date   APPENDECTOMY  1960   AXILLARY SURGERY Left 1978   Multiple "lump" in armpit per pt   BIOPSY  06/17/2020   Gastric bx->gastritis, h pylori neg.  Procedure: BIOPSY;  Surgeon: Jeani Hawking, MD;  Location: [facility];  Service: Endoscopy;;   CARDIAC CATHETERIZATION  01/2013   nonobstructive CAD, EF 55-60%   CARDIOVASCULAR STRESS TEST  02/22/2015   Low risk myocard perf imaging; wall motion normal, normal EF   CARDIOVASCULAR STRESS TEST     09/2017 myo perf low risk   carotid duplex doppler  10/21/2017   01/2023 1-30% bilat-->rpt 1 yr   COCCYX REMOVAL  1972   CT CTA CORONARY W/CA SCORE W/CM &/OR WO/CM     04/02/23 96%'tile calcium score, nonobst CAD-->crestor increased to 40   DEXA  06/05/2017   T-score -3.1   DILATION AND CURETTAGE OF UTERUS  ? 1970's   ESOPHAGOGASTRODUODENOSCOPY N/A 07/19/2014   Gastritis found + in the setting of supratherapeutic INR, +plavix, + meloxicam.   ESOPHAGOGASTRODUODENOSCOPY (EGD) WITH PROPOFOL N/A 06/17/2020   NO stricture or other prob to explain pt's dysphagia, dilation was done anyway.  Gastric bx's-->gastritis, h pylori neg. Procedure: ESOPHAGOGASTRODUODENOSCOPY (EGD) WITH PROPOFOL;  Surgeon: Jeani Hawking, MD;  Location: [facility];  Service: Endoscopy;  Laterality: N/A;   EYE SURGERY Left 2012-2013   "injections for ~ 1 yr; don't really know what for" (07/12/2013)   HEEL SPUR SURGERY Left 2008   kidney stone removal Right    KNEE SURGERY  2005   LEFT HEART CATHETERIZATION WITH CORONARY ANGIOGRAM N/A 01/30/2013   Procedure: LEFT HEART CATHETERIZATION WITH CORONARY ANGIOGRAM;  Surgeon: Robynn Pane, MD;  Location: [facility];  Service: Cardiovascular;  Laterality: N/A;   MALONEY DILATION  06/17/2020    Procedure: Elease Hashimoto DILATION;  Surgeon: Jeani Hawking, MD;  Location: [facility];  Service: Endoscopy;;   nasolaryngoscopy     06/2023 during hosp for resp failure-->normal   PLANTAR FASCIA RELEASE Left 2008   SPIROMETRY  04/25/2014   In hosp for acute asthma/COPD flare: mixed obstructive and restrictive lung disease. The FEV1 is severely reduced at 45% predicted.  FEV1 signif decreased compared to prior spirometry 07/23/13.   TENDON RELEASE  1996   Right forearm and hand   TOTAL ABDOMINAL HYSTERECTOMY  1974   TRANSTHORACIC ECHOCARDIOGRAM  01/2013; 04/2014;08/2015; 09/2017   2014--NORMAL.  2015--focal basal septal hypertrophy, EF 55-60%, grade I diast dysfxn, mild LAE.  08/2015 EF  55-60%, nl LV syst fxn, grade I DD, valves wnl. 10/21/17: EF 65-70%, grd I DD, o/w normal. 02/17/22 EF 70-75%, hyperdynamic LV fxn, grd I DD.   Past Surgical History:  Procedure Laterality Date   APPENDECTOMY  1960   AXILLARY SURGERY Left 1978   Multiple "lump" in armpit per pt   BIOPSY  06/17/2020   Gastric bx->gastritis, h pylori neg.  Procedure: BIOPSY;  Surgeon: Jeani Hawking, MD;  [facility]: [facility];  Service: Endoscopy;;   CARDIAC CATHETERIZATION  01/2013   nonobstructive CAD, EF 55-60%   CARDIOVASCULAR STRESS TEST  02/22/2015   Low risk myocard perf imaging; wall motion normal, normal EF   CARDIOVASCULAR STRESS TEST     09/2017 myo perf low risk   carotid duplex doppler  10/21/2017   01/2023 1-30% bilat-->rpt 1 yr   COCCYX REMOVAL  1972   CT CTA CORONARY W/CA SCORE W/CM &/OR WO/CM     04/02/23 96%'tile calcium score, nonobst CAD-->crestor increased to 40   DEXA  06/05/2017   T-score -3.1   DILATION AND CURETTAGE OF UTERUS  ? 1970's   ESOPHAGOGASTRODUODENOSCOPY N/A 07/19/2014   Gastritis found + in the setting of supratherapeutic INR, +plavix, + meloxicam.   ESOPHAGOGASTRODUODENOSCOPY (EGD) WITH PROPOFOL N/A 06/17/2020   NO stricture or other prob to explain pt's dysphagia, dilation was done anyway.   Gastric bx's-->gastritis, h pylori neg. Procedure: ESOPHAGOGASTRODUODENOSCOPY (EGD) WITH PROPOFOL;  Surgeon: Jeani Hawking, MD;  [facility];  Service: Endoscopy;  Laterality: N/A;   EYE SURGERY Left 2012-2013   "injections for ~ 1 yr; don't really know what for" (07/12/2013)   HEEL SPUR SURGERY Left 2008   kidney stone removal Right    KNEE SURGERY  2005   LEFT HEART CATHETERIZATION WITH CORONARY ANGIOGRAM N/A 01/30/2013   Procedure: LEFT HEART CATHETERIZATION WITH CORONARY ANGIOGRAM;  Surgeon: Robynn Pane, MD;  [facility]: [facility];  Service: Cardiovascular;  Laterality: N/A;   MALONEY DILATION  06/17/2020   Procedure: Elease Hashimoto DILATION;  Surgeon: Jeani Hawking, MD;  [facility]: [facility];  Service: Endoscopy;;   nasolaryngoscopy     06/2023 during hosp for resp failure-->normal   PLANTAR FASCIA RELEASE Left 2008   SPIROMETRY  04/25/2014   In hosp for acute asthma/COPD flare: mixed obstructive and restrictive lung disease. The FEV1 is severely reduced at 45% predicted.  FEV1 signif decreased compared to prior spirometry 07/23/13.   TENDON RELEASE  1996   Right forearm and hand   TOTAL ABDOMINAL HYSTERECTOMY  1974   TRANSTHORACIC ECHOCARDIOGRAM  01/2013; 04/2014;08/2015; 09/2017   2014--NORMAL.  2015--focal basal septal hypertrophy, EF 55-60%, grade I diast dysfxn, mild LAE.  08/2015 EF 55-60%, nl LV syst fxn, grade I DD, valves wnl. 10/21/17: EF 65-70%, grd I DD, o/w normal. 02/17/22 EF 70-75%, hyperdynamic LV fxn, grd I DD.   Past Medical History:  Diagnosis Date   Acute upper GI bleed 06/2014   while pt taking coumadin, plavix, and meloxicam---despite being told not to take coumadin.   Anginal pain (HCC)    Nonobstructive CAD 2014; however, her cardiologist put her on a statin for this and NOT for hyperlipidemia per pt report.  Atyp CP 08/2017 at card f/u, plan for myoc perf imaging.   Anxiety    panic attacks   BPPV (benign paroxysmal positional vertigo) 12/16/2012   CAD  (coronary artery disease)    Nonobstructive ([facility])   Chronic diastolic CHF (congestive heart failure) (HCC)    dry wt as of  11/06/16 is 168 lbs.   Chronic lower back pain    DDD (degenerative disc disease)    lumbar and cervical.    Diverticular disease    Fibromyalgia    Patient states dx was around her late 23s but she had sx's for years prior to this.   H/O hiatal hernia    History of pneumonia    hospitalized 12/2011, 02/2013, and 07/2013 [facility]) for this   HTN (hypertension)    Renal artery dopplers 04/2013 neg for stenosis.   Hypervitaminosis D 09/27/2019   over-supplemented.  Stopped vit D and plan recheck 2 mo.   Idiopathic angio-edema-urticaria 16109; 2021   Angioedema component was very minimal.  2021->Dr. Bobbitt (allergist) eval.   Insomnia    Iron deficiency anemia    Hematologist in [place], [place] did extensive w/u; no cause found; failed oral supplement;; gets fairly regular (q15m or so) IV iron infusions (Venofer -iron sucrose- 200mg  with procrit.  "for 14 yr I've been getting blood work q month & getting infusions prn" (07/12/2013).  Dr. Myna Hidalgo locally, iron infusions done, EPO deficiency dx'd   Migraine syndrome    "not as often anymore; used to be ~ q wk" (07/12/2013)   Mixed incontinence urge and stress    Nephrolithiasis    "passed all on my own or they are still in there" (07/12/2013)   Neuroleptic induced parkinsonism (HCC) 2018   Dr. Arbutus Leas, neuro, saw her 11/24/17 and recommended d/c of abilify as first step.  D/c'd abilify and pt got complete recovery.   Oropharyngeal dysphagia    swallowing study speech path 05/2020. Gastric bx's showed gastritis, h pylori NEG   OSA on CPAP    prior to move to [place]--had another sleep study 10/2015 w/pulm Dr. Su Monks.   Osteoarthritis    "severe; progressing fast" (07/12/2013); multiple joints-not surgical candidate for TKR (03/2015).  Triamcinolon knee injections by Dr. Hermelinda Medicus 12/2017.   Pernicious anemia 08/24/2014   Pleural plaque  with presence of asbestos 07/22/2013   Pulmonary embolism (HCC) 07/2013   Dx at [facility] with very small peripheral upper lobe pe 07/2013: pt took coumadin for about 8-9 mo   Pyelonephritis    "several times over the last 30 yr" (07/12/2013)   RBBB (right bundle branch block)    Recurrent major depression (HCC)    Recurrent UTI    hx of hospitalization for pyelonephritis; started abx prophylaxis 06/2015   Syncope    Hypotensive; ED visit--Dr. Sharyn Lull did Cath--nonobstructive CAD, EF 55-60%.  In retrospect, suspect pt rx med misuse/polypharmacy   Vertebral compression fracture (HCC)    Acute T12 on 02/27/22 (fall).  multiple old thoracic-->neurosurg to do MRI as of 05/2022   BP 131/67   Pulse 74   Ht 5\' 4"  (1.626 m)   Wt 154 lb 9.6 oz (70.1 kg)   SpO2 91%   BMI 26.54 kg/m   Opioid Risk Score:   Fall Risk Score:  `1  Depression screen [facility] 2/9     10/07/2023   11:20 AM 08/05/2023    9:55 AM 07/02/2023    9:23 AM 06/11/2023   11:03 AM 05/01/2023    9:54 AM 04/23/2023   10:54 AM 01/01/2023    1:57 PM  Depression screen PHQ 2/9  Decreased Interest 0 2 0 0 3 0 0  Down, Depressed, Hopeless 0 1  0 2 2 2   PHQ - 2 Score 0 3 0 0 5 2 2   Altered sleeping  1   2  0 0  Tired, decreased energy  1   2 1 1   Change in appetite  1   2 0 1  Feeling bad or failure about yourself   1   2 1 1   Trouble concentrating  0   2 0 0  Moving slowly or fidgety/restless  0   1 0 0  Suicidal thoughts  0   0 0 0  PHQ-9 Score  7   16 4 5   Difficult doing work/chores  Somewhat difficult   Very difficult Somewhat difficult Somewhat difficult     Review of Systems  Musculoskeletal:  Positive for back pain.       Bilateral leg pain  All other systems reviewed and are negative.     Objective:   Physical Exam Vitals and nursing note reviewed.  Constitutional:      Appearance: Normal appearance.  Cardiovascular:     Rate and Rhythm: Normal rate and regular rhythm.     Pulses: Normal pulses.     Heart sounds: Normal  heart sounds.  Pulmonary:     Breath sounds: Wheezing and rhonchi present.     Comments: Continuous Oxygen 4 Liters Nasal Cannula  Musculoskeletal:     Comments: Normal Muscle Bulk and Muscle Testing Reveals:  Upper Extremities: Full ROM and Muscle Strength 5/5 Bilateral AC Joint Tenderness  Thoracic Paraspinal Tenderness: T-7-T-!0 Lumbar Paraspinal Tenderness: L-3-L-5 Lower Extremities: Decreased ROM and Muscle Strength 5/5 Bilateral Lower Extremities Flexion Produces Pain into her Bilateral Lower Extremities Arises from Table slowly using walker for support Antalgic Gait     Skin:    General: Skin is warm and dry.  Neurological:     Mental Status: She is alert and oriented to person, place, and time.  Psychiatric:        Mood and Affect: Mood normal.        Behavior: Behavior normal.         Assessment & Plan:  1.Cervicalgia/ Cervical Radiculitis: No complaints today. Continue HEP as Tolerated.  Continue to Monitor. 12/11/2023 2. Left Pelvic Pain/ Left Hip Pain: No complaints today. Continue HEP as Tolerated. Continue to Monitor. 12/11/2023 3. Functional deficits secondary to Gait disorder:Continue with HEP. 12/11/2023. 4. Chronic Back pain/ Lumbar Radiculitis/fibromyalgia /R>L Knee OA Pain Management: 12/11/2023 Refilled: Oxycodone 10/325 mg one tablet every 6 hours #120.  Second script sent for the following month. Continue Butrans 5 MCG/ Weekly patch.   Continue Voltaren Gel. We will continue the opioid monitoring program, this consists of regular clinic visits, examinations, urine drug screen, pill counts as well as use of [place] Controlled Substance Reporting system. A 12 month History has been reviewed on the [place] Controlled Substance Reporting System on 12/11/2023 5. Depression with anxiety/Grief reaction/Mood: Continue Xanax, Trazodone and Cymbalta . PCP Following. 12/11/2023 6. [name] with asthma:. Albuterol prn. :Pulmonology Following.  12/11/2023 7.. Bilateral Osteoarthritis of Bilateral Knees: Ms. Botero S/P  Bilateral Zilretta Injection with Dr Riley Kill,  with good relief noted. .Continue to Monitor. 12/11/2023 8. Fibromyalgia: Continue Home exercise regimen as tolerated. Continue to Monitor.12/11/2023 9. Bilateral  Greater Trochanteric Bursitis: Continue to  Alternate Ice and Heat Therapy. Continue to Monitor. 12/11/2023. 10. Chronic Bilateral Shoulder Pain:   Continue HEP as Tolerated. Continue current medication regimen. Continue to Monitor. 12/11/2023   F/U in 2 months

## 2023-12-12 NOTE — Telephone Encounter (Signed)
Spoke with the pt  She received help with getting a new POC  Nothing further needed

## 2023-12-15 DIAGNOSIS — R2689 Other abnormalities of gait and mobility: Secondary | ICD-10-CM | POA: Diagnosis not present

## 2023-12-15 DIAGNOSIS — M6281 Muscle weakness (generalized): Secondary | ICD-10-CM | POA: Diagnosis not present

## 2023-12-16 ENCOUNTER — Other Ambulatory Visit: Payer: Self-pay | Admitting: Nurse Practitioner

## 2023-12-22 DIAGNOSIS — R2689 Other abnormalities of gait and mobility: Secondary | ICD-10-CM | POA: Diagnosis not present

## 2023-12-22 DIAGNOSIS — M6281 Muscle weakness (generalized): Secondary | ICD-10-CM | POA: Diagnosis not present

## 2023-12-23 ENCOUNTER — Ambulatory Visit: Payer: Medicare Other

## 2023-12-23 VITALS — BP 144/58 | HR 104 | Temp 97.9°F | Resp 22 | Ht 64.0 in | Wt 156.2 lb

## 2023-12-23 DIAGNOSIS — J4550 Severe persistent asthma, uncomplicated: Secondary | ICD-10-CM | POA: Diagnosis not present

## 2023-12-23 DIAGNOSIS — R41841 Cognitive communication deficit: Secondary | ICD-10-CM | POA: Diagnosis not present

## 2023-12-23 DIAGNOSIS — R490 Dysphonia: Secondary | ICD-10-CM | POA: Diagnosis not present

## 2023-12-23 DIAGNOSIS — M6281 Muscle weakness (generalized): Secondary | ICD-10-CM | POA: Diagnosis not present

## 2023-12-23 MED ORDER — TEZEPELUMAB-EKKO 210 MG/1.91ML ~~LOC~~ SOSY
210.0000 mg | PREFILLED_SYRINGE | Freq: Once | SUBCUTANEOUS | Status: AC
  Administered 2023-12-23: 210 mg via SUBCUTANEOUS
  Filled 2023-12-23: qty 1.91

## 2023-12-23 NOTE — Progress Notes (Signed)
 Diagnosis: Asthma  Provider:  Mannam, Praveen MD  Procedure: Injection  Tezspire  (Tezepelumab ), Dose: 210 mg, Site: subcutaneous, Number of injections: 1  Injection Site(s): Right arm   Discharge: Condition: Good, Destination: Home . AVS Declined  Performed by:  Donny Childes, RN

## 2023-12-25 ENCOUNTER — Encounter (HOSPITAL_COMMUNITY): Payer: Self-pay

## 2023-12-25 ENCOUNTER — Inpatient Hospital Stay (HOSPITAL_COMMUNITY)
Admission: EM | Admit: 2023-12-25 | Discharge: 2024-01-09 | DRG: 193 | Disposition: A | Discharge status: Home Health | Payer: Medicare Other | Source: Skilled Nursing Facility | Attending: Internal Medicine | Admitting: Internal Medicine

## 2023-12-25 ENCOUNTER — Emergency Department (HOSPITAL_COMMUNITY): Payer: Medicare Other

## 2023-12-25 ENCOUNTER — Inpatient Hospital Stay (HOSPITAL_COMMUNITY): Payer: Medicare Other

## 2023-12-25 ENCOUNTER — Other Ambulatory Visit: Payer: Self-pay

## 2023-12-25 DIAGNOSIS — Z1152 Encounter for screening for COVID-19: Secondary | ICD-10-CM

## 2023-12-25 DIAGNOSIS — Z7401 Bed confinement status: Secondary | ICD-10-CM | POA: Diagnosis not present

## 2023-12-25 DIAGNOSIS — Z833 Family history of diabetes mellitus: Secondary | ICD-10-CM

## 2023-12-25 DIAGNOSIS — I5032 Chronic diastolic (congestive) heart failure: Secondary | ICD-10-CM

## 2023-12-25 DIAGNOSIS — I4510 Unspecified right bundle-branch block: Secondary | ICD-10-CM | POA: Diagnosis present

## 2023-12-25 DIAGNOSIS — Z66 Do not resuscitate: Secondary | ICD-10-CM | POA: Diagnosis present

## 2023-12-25 DIAGNOSIS — R059 Cough, unspecified: Secondary | ICD-10-CM | POA: Diagnosis not present

## 2023-12-25 DIAGNOSIS — G8929 Other chronic pain: Secondary | ICD-10-CM | POA: Diagnosis not present

## 2023-12-25 DIAGNOSIS — F419 Anxiety disorder, unspecified: Secondary | ICD-10-CM | POA: Diagnosis not present

## 2023-12-25 DIAGNOSIS — J9621 Acute and chronic respiratory failure with hypoxia: Principal | ICD-10-CM | POA: Diagnosis present

## 2023-12-25 DIAGNOSIS — R918 Other nonspecific abnormal finding of lung field: Secondary | ICD-10-CM | POA: Diagnosis not present

## 2023-12-25 DIAGNOSIS — Z820 Family history of epilepsy and other diseases of the nervous system: Secondary | ICD-10-CM

## 2023-12-25 DIAGNOSIS — J929 Pleural plaque without asbestos: Secondary | ICD-10-CM | POA: Diagnosis not present

## 2023-12-25 DIAGNOSIS — Z7982 Long term (current) use of aspirin: Secondary | ICD-10-CM

## 2023-12-25 DIAGNOSIS — R54 Age-related physical debility: Secondary | ICD-10-CM | POA: Diagnosis present

## 2023-12-25 DIAGNOSIS — J309 Allergic rhinitis, unspecified: Secondary | ICD-10-CM | POA: Diagnosis not present

## 2023-12-25 DIAGNOSIS — R0603 Acute respiratory distress: Secondary | ICD-10-CM | POA: Diagnosis not present

## 2023-12-25 DIAGNOSIS — G894 Chronic pain syndrome: Secondary | ICD-10-CM | POA: Diagnosis present

## 2023-12-25 DIAGNOSIS — J9611 Chronic respiratory failure with hypoxia: Secondary | ICD-10-CM | POA: Diagnosis not present

## 2023-12-25 DIAGNOSIS — J329 Chronic sinusitis, unspecified: Secondary | ICD-10-CM | POA: Diagnosis not present

## 2023-12-25 DIAGNOSIS — Z515 Encounter for palliative care: Secondary | ICD-10-CM

## 2023-12-25 DIAGNOSIS — Z841 Family history of disorders of kidney and ureter: Secondary | ICD-10-CM

## 2023-12-25 DIAGNOSIS — J440 Chronic obstructive pulmonary disease with acute lower respiratory infection: Secondary | ICD-10-CM | POA: Diagnosis present

## 2023-12-25 DIAGNOSIS — M1711 Unilateral primary osteoarthritis, right knee: Secondary | ICD-10-CM

## 2023-12-25 DIAGNOSIS — E440 Moderate protein-calorie malnutrition: Secondary | ICD-10-CM | POA: Diagnosis not present

## 2023-12-25 DIAGNOSIS — M5416 Radiculopathy, lumbar region: Secondary | ICD-10-CM

## 2023-12-25 DIAGNOSIS — E785 Hyperlipidemia, unspecified: Secondary | ICD-10-CM | POA: Diagnosis present

## 2023-12-25 DIAGNOSIS — Z825 Family history of asthma and other chronic lower respiratory diseases: Secondary | ICD-10-CM

## 2023-12-25 DIAGNOSIS — J441 Chronic obstructive pulmonary disease with (acute) exacerbation: Secondary | ICD-10-CM | POA: Diagnosis present

## 2023-12-25 DIAGNOSIS — B370 Candidal stomatitis: Secondary | ICD-10-CM | POA: Diagnosis present

## 2023-12-25 DIAGNOSIS — J4551 Severe persistent asthma with (acute) exacerbation: Principal | ICD-10-CM | POA: Diagnosis present

## 2023-12-25 DIAGNOSIS — Z7951 Long term (current) use of inhaled steroids: Secondary | ICD-10-CM

## 2023-12-25 DIAGNOSIS — G2581 Restless legs syndrome: Secondary | ICD-10-CM | POA: Diagnosis present

## 2023-12-25 DIAGNOSIS — I10 Essential (primary) hypertension: Secondary | ICD-10-CM | POA: Diagnosis not present

## 2023-12-25 DIAGNOSIS — J4521 Mild intermittent asthma with (acute) exacerbation: Secondary | ICD-10-CM

## 2023-12-25 DIAGNOSIS — Z888 Allergy status to other drugs, medicaments and biological substances status: Secondary | ICD-10-CM

## 2023-12-25 DIAGNOSIS — J45901 Unspecified asthma with (acute) exacerbation: Secondary | ICD-10-CM | POA: Diagnosis not present

## 2023-12-25 DIAGNOSIS — I110 Hypertensive heart disease with heart failure: Secondary | ICD-10-CM | POA: Diagnosis present

## 2023-12-25 DIAGNOSIS — Z880 Allergy status to penicillin: Secondary | ICD-10-CM

## 2023-12-25 DIAGNOSIS — R079 Chest pain, unspecified: Secondary | ICD-10-CM | POA: Diagnosis not present

## 2023-12-25 DIAGNOSIS — M797 Fibromyalgia: Secondary | ICD-10-CM | POA: Diagnosis present

## 2023-12-25 DIAGNOSIS — J398 Other specified diseases of upper respiratory tract: Secondary | ICD-10-CM | POA: Diagnosis not present

## 2023-12-25 DIAGNOSIS — K219 Gastro-esophageal reflux disease without esophagitis: Secondary | ICD-10-CM | POA: Diagnosis not present

## 2023-12-25 DIAGNOSIS — Z8261 Family history of arthritis: Secondary | ICD-10-CM

## 2023-12-25 DIAGNOSIS — J984 Other disorders of lung: Secondary | ICD-10-CM | POA: Diagnosis present

## 2023-12-25 DIAGNOSIS — Z7952 Long term (current) use of systemic steroids: Secondary | ICD-10-CM

## 2023-12-25 DIAGNOSIS — M545 Low back pain, unspecified: Secondary | ICD-10-CM | POA: Diagnosis present

## 2023-12-25 DIAGNOSIS — G4700 Insomnia, unspecified: Secondary | ICD-10-CM | POA: Diagnosis present

## 2023-12-25 DIAGNOSIS — Z823 Family history of stroke: Secondary | ICD-10-CM

## 2023-12-25 DIAGNOSIS — J4541 Moderate persistent asthma with (acute) exacerbation: Secondary | ICD-10-CM | POA: Diagnosis not present

## 2023-12-25 DIAGNOSIS — J019 Acute sinusitis, unspecified: Secondary | ICD-10-CM | POA: Diagnosis not present

## 2023-12-25 DIAGNOSIS — Z882 Allergy status to sulfonamides status: Secondary | ICD-10-CM

## 2023-12-25 DIAGNOSIS — Z9981 Dependence on supplemental oxygen: Secondary | ICD-10-CM

## 2023-12-25 DIAGNOSIS — J61 Pneumoconiosis due to asbestos and other mineral fibers: Secondary | ICD-10-CM | POA: Diagnosis present

## 2023-12-25 DIAGNOSIS — I2510 Atherosclerotic heart disease of native coronary artery without angina pectoris: Secondary | ICD-10-CM | POA: Diagnosis present

## 2023-12-25 DIAGNOSIS — J920 Pleural plaque with presence of asbestos: Secondary | ICD-10-CM | POA: Diagnosis not present

## 2023-12-25 DIAGNOSIS — J4540 Moderate persistent asthma, uncomplicated: Secondary | ICD-10-CM | POA: Diagnosis not present

## 2023-12-25 DIAGNOSIS — M1712 Unilateral primary osteoarthritis, left knee: Secondary | ICD-10-CM

## 2023-12-25 DIAGNOSIS — Z86711 Personal history of pulmonary embolism: Secondary | ICD-10-CM

## 2023-12-25 DIAGNOSIS — J189 Pneumonia, unspecified organism: Secondary | ICD-10-CM | POA: Diagnosis not present

## 2023-12-25 DIAGNOSIS — Z8701 Personal history of pneumonia (recurrent): Secondary | ICD-10-CM

## 2023-12-25 DIAGNOSIS — I959 Hypotension, unspecified: Secondary | ICD-10-CM | POA: Diagnosis present

## 2023-12-25 DIAGNOSIS — Z881 Allergy status to other antibiotic agents status: Secondary | ICD-10-CM

## 2023-12-25 DIAGNOSIS — F32A Depression, unspecified: Secondary | ICD-10-CM | POA: Diagnosis present

## 2023-12-25 DIAGNOSIS — J9620 Acute and chronic respiratory failure, unspecified whether with hypoxia or hypercapnia: Secondary | ICD-10-CM | POA: Diagnosis present

## 2023-12-25 DIAGNOSIS — Z8249 Family history of ischemic heart disease and other diseases of the circulatory system: Secondary | ICD-10-CM

## 2023-12-25 DIAGNOSIS — F319 Bipolar disorder, unspecified: Secondary | ICD-10-CM | POA: Diagnosis not present

## 2023-12-25 DIAGNOSIS — J9811 Atelectasis: Secondary | ICD-10-CM | POA: Diagnosis present

## 2023-12-25 DIAGNOSIS — M1990 Unspecified osteoarthritis, unspecified site: Secondary | ICD-10-CM | POA: Diagnosis not present

## 2023-12-25 DIAGNOSIS — G4733 Obstructive sleep apnea (adult) (pediatric): Secondary | ICD-10-CM | POA: Diagnosis present

## 2023-12-25 DIAGNOSIS — R0902 Hypoxemia: Secondary | ICD-10-CM | POA: Diagnosis not present

## 2023-12-25 DIAGNOSIS — R0602 Shortness of breath: Secondary | ICD-10-CM | POA: Diagnosis not present

## 2023-12-25 DIAGNOSIS — J342 Deviated nasal septum: Secondary | ICD-10-CM | POA: Diagnosis not present

## 2023-12-25 DIAGNOSIS — J9600 Acute respiratory failure, unspecified whether with hypoxia or hypercapnia: Secondary | ICD-10-CM | POA: Diagnosis not present

## 2023-12-25 DIAGNOSIS — I700 Atherosclerosis of aorta: Secondary | ICD-10-CM | POA: Diagnosis not present

## 2023-12-25 DIAGNOSIS — Z79899 Other long term (current) drug therapy: Secondary | ICD-10-CM

## 2023-12-25 DIAGNOSIS — Z6825 Body mass index (BMI) 25.0-25.9, adult: Secondary | ICD-10-CM

## 2023-12-25 DIAGNOSIS — J9601 Acute respiratory failure with hypoxia: Secondary | ICD-10-CM | POA: Diagnosis not present

## 2023-12-25 DIAGNOSIS — Z7189 Other specified counseling: Secondary | ICD-10-CM | POA: Diagnosis not present

## 2023-12-25 LAB — CBC
HCT: 36.7 % (ref 36.0–46.0)
Hemoglobin: 11.7 g/dL — ABNORMAL LOW (ref 12.0–15.0)
MCH: 28.4 pg (ref 26.0–34.0)
MCHC: 31.9 g/dL (ref 30.0–36.0)
MCV: 89.1 fL (ref 80.0–100.0)
Platelets: 292 10*3/uL (ref 150–400)
RBC: 4.12 MIL/uL (ref 3.87–5.11)
RDW: 12.7 % (ref 11.5–15.5)
WBC: 11.1 10*3/uL — ABNORMAL HIGH (ref 4.0–10.5)
nRBC: 0 % (ref 0.0–0.2)

## 2023-12-25 LAB — COMPREHENSIVE METABOLIC PANEL
ALT: 11 U/L (ref 0–44)
AST: 19 U/L (ref 15–41)
Albumin: 2.8 g/dL — ABNORMAL LOW (ref 3.5–5.0)
Alkaline Phosphatase: 61 U/L (ref 38–126)
Anion gap: 12 (ref 5–15)
BUN: 14 mg/dL (ref 8–23)
CO2: 29 mmol/L (ref 22–32)
Calcium: 8.4 mg/dL — ABNORMAL LOW (ref 8.9–10.3)
Chloride: 95 mmol/L — ABNORMAL LOW (ref 98–111)
Creatinine, Ser: 1.13 mg/dL — ABNORMAL HIGH (ref 0.44–1.00)
GFR, Estimated: 50 mL/min — ABNORMAL LOW (ref >60)
Glucose, Bld: 109 mg/dL — ABNORMAL HIGH (ref 70–99)
Potassium: 3.6 mmol/L (ref 3.5–5.1)
Sodium: 136 mmol/L (ref 135–145)
Total Bilirubin: 0.5 mg/dL (ref 0.0–1.2)
Total Protein: 6.8 g/dL (ref 6.5–8.1)

## 2023-12-25 LAB — RESPIRATORY PANEL BY PCR

## 2023-12-25 LAB — I-STAT VENOUS BLOOD GAS, ED
Acid-Base Excess: 4 mmol/L — ABNORMAL HIGH (ref 0.0–2.0)
Bicarbonate: 28 mmol/L (ref 20.0–28.0)
Calcium, Ion: 1.03 mmol/L — ABNORMAL LOW (ref 1.15–1.40)
HCT: 34 % — ABNORMAL LOW (ref 36.0–46.0)
Hemoglobin: 11.6 g/dL — ABNORMAL LOW (ref 12.0–15.0)
O2 Saturation: 96 %
Potassium: 3.1 mmol/L — ABNORMAL LOW (ref 3.5–5.1)
Sodium: 137 mmol/L (ref 135–145)
TCO2: 29 mmol/L (ref 22–32)
pCO2, Ven: 39.3 mm[Hg] — ABNORMAL LOW (ref 44–60)
pH, Ven: 7.461 — ABNORMAL HIGH (ref 7.25–7.43)
pO2, Ven: 75 mm[Hg] — ABNORMAL HIGH (ref 32–45)

## 2023-12-25 LAB — RESP PANEL BY RT-PCR (RSV, FLU A&B, COVID)  RVPGX2
Influenza A by PCR: NEGATIVE
Influenza B by PCR: NEGATIVE
Resp Syncytial Virus by PCR: NEGATIVE
SARS Coronavirus 2 by RT PCR: NEGATIVE

## 2023-12-25 LAB — PROCALCITONIN: Procalcitonin: 0.11 ng/mL

## 2023-12-25 MED ORDER — SODIUM CHLORIDE 0.9 % IV SOLN
500.0000 mg | INTRAVENOUS | Status: DC
  Administered 2023-12-25 – 2023-12-26 (×2): 500 mg via INTRAVENOUS
  Filled 2023-12-25 (×2): qty 5

## 2023-12-25 MED ORDER — FUROSEMIDE 20 MG PO TABS
20.0000 mg | ORAL_TABLET | Freq: Every day | ORAL | Status: DC
  Administered 2023-12-26 – 2023-12-28 (×3): 20 mg via ORAL
  Filled 2023-12-25 (×3): qty 1

## 2023-12-25 MED ORDER — ONDANSETRON HCL 4 MG/2ML IJ SOLN
4.0000 mg | Freq: Four times a day (QID) | INTRAMUSCULAR | Status: DC | PRN
  Administered 2023-12-31: 4 mg via INTRAVENOUS
  Filled 2023-12-25: qty 2

## 2023-12-25 MED ORDER — IPRATROPIUM-ALBUTEROL 0.5-2.5 (3) MG/3ML IN SOLN
3.0000 mL | RESPIRATORY_TRACT | Status: DC
  Administered 2023-12-26 (×3): 3 mL via RESPIRATORY_TRACT
  Filled 2023-12-25 (×4): qty 3

## 2023-12-25 MED ORDER — SODIUM CHLORIDE 0.9 % IV SOLN: 250.0000 mL | INTRAVENOUS | Status: AC | PRN

## 2023-12-25 MED ORDER — FESOTERODINE FUMARATE ER 4 MG PO TB24: 4.0000 mg | ORAL_TABLET | Freq: Every day | ORAL | Status: DC

## 2023-12-25 MED ORDER — IPRATROPIUM-ALBUTEROL 0.5-2.5 (3) MG/3ML IN SOLN
3.0000 mL | Freq: Once | RESPIRATORY_TRACT | Status: AC
  Administered 2023-12-25: 3 mL via RESPIRATORY_TRACT
  Filled 2023-12-25: qty 3

## 2023-12-25 MED ORDER — AZITHROMYCIN 250 MG PO TABS: 500.0000 mg | ORAL_TABLET | Freq: Every day | ORAL | Status: DC

## 2023-12-25 MED ORDER — AZITHROMYCIN 250 MG PO TABS
500.0000 mg | ORAL_TABLET | Freq: Every day | ORAL | Status: DC
  Administered 2023-12-26 – 2023-12-27 (×2): 500 mg via ORAL
  Filled 2023-12-25 (×2): qty 2

## 2023-12-25 MED ORDER — IPRATROPIUM-ALBUTEROL 0.5-2.5 (3) MG/3ML IN SOLN
3.0000 mL | Freq: Four times a day (QID) | RESPIRATORY_TRACT | Status: DC
Start: 2023-12-25 — End: 2023-12-25
  Administered 2023-12-25: 3 mL via RESPIRATORY_TRACT
  Filled 2023-12-25: qty 3

## 2023-12-25 MED ORDER — ALBUTEROL SULFATE (2.5 MG/3ML) 0.083% IN NEBU: 2.5000 mg | INHALATION_SOLUTION | RESPIRATORY_TRACT | Status: DC | PRN

## 2023-12-25 MED ORDER — ISOSORBIDE MONONITRATE ER 30 MG PO TB24
30.0000 mg | ORAL_TABLET | Freq: Two times a day (BID) | ORAL | Status: DC
  Administered 2023-12-26 – 2024-01-03 (×18): 30 mg via ORAL
  Filled 2023-12-25 (×19): qty 1

## 2023-12-25 MED ORDER — PANTOPRAZOLE SODIUM 40 MG PO TBEC
40.0000 mg | DELAYED_RELEASE_TABLET | Freq: Two times a day (BID) | ORAL | Status: DC
  Administered 2023-12-26 – 2024-01-09 (×30): 40 mg via ORAL
  Filled 2023-12-25 (×30): qty 1

## 2023-12-25 MED ORDER — GUAIFENESIN ER 600 MG PO TB12
600.0000 mg | ORAL_TABLET | Freq: Two times a day (BID) | ORAL | Status: DC
  Administered 2023-12-26 – 2024-01-09 (×30): 600 mg via ORAL
  Filled 2023-12-25 (×30): qty 1

## 2023-12-25 MED ORDER — OXYCODONE-ACETAMINOPHEN 5-325 MG PO TABS
1.0000 | ORAL_TABLET | Freq: Four times a day (QID) | ORAL | Status: DC | PRN
  Administered 2023-12-26 – 2024-01-05 (×35): 1 via ORAL
  Filled 2023-12-25 (×36): qty 1

## 2023-12-25 MED ORDER — SODIUM CHLORIDE 0.9 % IV BOLUS
1000.0000 mL | Freq: Once | INTRAVENOUS | Status: AC
  Administered 2023-12-25: 1000 mL via INTRAVENOUS

## 2023-12-25 MED ORDER — METHYLPREDNISOLONE SODIUM SUCC 125 MG IJ SOLR
125.0000 mg | Freq: Once | INTRAMUSCULAR | Status: AC
  Administered 2023-12-25: 125 mg via INTRAVENOUS
  Filled 2023-12-25: qty 2

## 2023-12-25 MED ORDER — MONTELUKAST SODIUM 10 MG PO TABS
10.0000 mg | ORAL_TABLET | Freq: Every day | ORAL | Status: DC
  Administered 2023-12-26 – 2024-01-08 (×15): 10 mg via ORAL
  Filled 2023-12-25 (×15): qty 1

## 2023-12-25 MED ORDER — LORATADINE 10 MG PO TABS: 10.0000 mg | ORAL_TABLET | Freq: Every day | ORAL | Status: DC | PRN

## 2023-12-25 MED ORDER — PREDNISONE 20 MG PO TABS
40.0000 mg | ORAL_TABLET | Freq: Every day | ORAL | Status: DC
  Administered 2023-12-27: 40 mg via ORAL
  Filled 2023-12-25: qty 2

## 2023-12-25 MED ORDER — GUAIFENESIN 100 MG/5ML PO LIQD
10.0000 mL | Freq: Four times a day (QID) | ORAL | Status: DC
  Administered 2023-12-25 – 2024-01-05 (×41): 10 mL via ORAL
  Filled 2023-12-25 (×42): qty 10

## 2023-12-25 MED ORDER — SODIUM CHLORIDE 0.9 % IV SOLN: 500.0000 mg | INTRAVENOUS | Status: DC

## 2023-12-25 MED ORDER — ACETAMINOPHEN 500 MG PO TABS: 1000.0000 mg | ORAL_TABLET | Freq: Once | ORAL | Status: DC

## 2023-12-25 MED ORDER — OXYCODONE-ACETAMINOPHEN 10-325 MG PO TABS: 1.0000 | ORAL_TABLET | Freq: Four times a day (QID) | ORAL | Status: DC | PRN

## 2023-12-25 MED ORDER — ONDANSETRON HCL 4 MG PO TABS: 4.0000 mg | ORAL_TABLET | Freq: Four times a day (QID) | ORAL | Status: DC | PRN

## 2023-12-25 MED ORDER — SODIUM CHLORIDE 0.9% FLUSH
3.0000 mL | INTRAVENOUS | Status: DC | PRN
  Administered 2023-12-27: 3 mL via INTRAVENOUS

## 2023-12-25 MED ORDER — METHYLPREDNISOLONE SODIUM SUCC 40 MG IJ SOLR
40.0000 mg | Freq: Two times a day (BID) | INTRAMUSCULAR | Status: DC
  Filled 2023-12-25: qty 1

## 2023-12-25 MED ORDER — GABAPENTIN 300 MG PO CAPS
300.0000 mg | ORAL_CAPSULE | Freq: Two times a day (BID) | ORAL | Status: DC
  Administered 2023-12-26 – 2023-12-30 (×11): 300 mg via ORAL
  Filled 2023-12-25 (×11): qty 1

## 2023-12-25 MED ORDER — ACETAMINOPHEN 650 MG RE SUPP: 650.0000 mg | Freq: Four times a day (QID) | RECTAL | Status: DC | PRN

## 2023-12-25 MED ORDER — SODIUM CHLORIDE 0.9 % IV SOLN
2.0000 g | INTRAVENOUS | Status: DC
  Administered 2023-12-25 – 2023-12-26 (×2): 2 g via INTRAVENOUS
  Filled 2023-12-25 (×2): qty 20

## 2023-12-25 MED ORDER — ROPINIROLE HCL 1 MG PO TABS
0.5000 mg | ORAL_TABLET | Freq: Every evening | ORAL | Status: DC | PRN
Start: 2023-12-25 — End: 2024-01-09
  Administered 2024-01-08: 0.5 mg via ORAL
  Filled 2023-12-25 (×2): qty 1

## 2023-12-25 MED ORDER — OXYCODONE HCL 5 MG PO TABS
5.0000 mg | ORAL_TABLET | Freq: Four times a day (QID) | ORAL | Status: DC | PRN
  Administered 2023-12-26 – 2024-01-05 (×37): 5 mg via ORAL
  Filled 2023-12-25 (×37): qty 1

## 2023-12-25 MED ORDER — ACETAMINOPHEN 325 MG PO TABS: 650.0000 mg | ORAL_TABLET | Freq: Four times a day (QID) | ORAL | Status: DC | PRN

## 2023-12-25 MED ORDER — IPRATROPIUM-ALBUTEROL 0.5-2.5 (3) MG/3ML IN SOLN
3.0000 mL | RESPIRATORY_TRACT | Status: DC | PRN
  Administered 2023-12-25: 3 mL via RESPIRATORY_TRACT

## 2023-12-25 MED ORDER — SODIUM CHLORIDE 0.9% FLUSH
3.0000 mL | Freq: Two times a day (BID) | INTRAVENOUS | Status: DC
  Administered 2023-12-26 – 2024-01-09 (×26): 3 mL via INTRAVENOUS

## 2023-12-25 MED ORDER — DULOXETINE HCL 60 MG PO CPEP
60.0000 mg | ORAL_CAPSULE | Freq: Every day | ORAL | Status: DC
  Administered 2023-12-26 – 2024-01-09 (×15): 60 mg via ORAL
  Filled 2023-12-25 (×5): qty 1
  Filled 2023-12-25: qty 2
  Filled 2023-12-25 (×9): qty 1

## 2023-12-25 MED ORDER — METHYLPREDNISOLONE SODIUM SUCC 40 MG IJ SOLR
40.0000 mg | Freq: Two times a day (BID) | INTRAMUSCULAR | Status: AC
  Administered 2023-12-26 (×2): 40 mg via INTRAVENOUS
  Filled 2023-12-25 (×2): qty 1

## 2023-12-25 MED ORDER — ALPRAZOLAM 0.5 MG PO TABS
0.5000 mg | ORAL_TABLET | Freq: Two times a day (BID) | ORAL | Status: DC | PRN
  Administered 2023-12-26 – 2023-12-30 (×9): 0.5 mg via ORAL
  Filled 2023-12-25 (×6): qty 1
  Filled 2023-12-25 (×2): qty 2
  Filled 2023-12-25: qty 1
  Filled 2023-12-25: qty 2

## 2023-12-25 NOTE — Assessment & Plan Note (Addendum)
 In the setting of worsening COPD/asthma/pulmonary fibrosis Unclear if symptoms secondary to asthma exacerbation versus just progression of pulmonary fibrosis. For tonight continue with albuterol  as needed and scheduled DuoNeb Prednisone  taper Titrate oxygen  as needed Will need pulmonology consult and may benefit from palliative care consult as well   Provide O2 therapy and titrate as needed  Continuous pulse ox   check Pulse ox with ambulation prior to discharge    flutter valve ordered

## 2023-12-25 NOTE — Assessment & Plan Note (Signed)
 Restart home medications ?

## 2023-12-25 NOTE — Assessment & Plan Note (Addendum)
-  -   Will initiate: Steroid taper  -  Antibiotics azithromycin  given COPD component - Albuterol   PRN, - scheduled duoneb,     -  Mucinex .  Titrate O2 to saturation >90%. Follow patients respiratory status.  influenza PCR neg     Currently mentating well no evidence of symptomatic hypercarbia

## 2023-12-25 NOTE — Assessment & Plan Note (Signed)
 Continue home dose of Xanax but decrease frequency to twice daily as needed

## 2023-12-25 NOTE — Assessment & Plan Note (Signed)
 Continue home dose of Lasix 20 mg a day monitor for any evidence of fluid overload

## 2023-12-25 NOTE — ED Notes (Signed)
 Central telemetry notified for pt monitoring.

## 2023-12-25 NOTE — Assessment & Plan Note (Addendum)
 Continue for tonight Imdur but decrease dose to 30 mg p.o. twice daily Hold off on Toprol given increased wheezing

## 2023-12-25 NOTE — H&P (Signed)
 Linda [name] DOB: 15-Jan-1946 DOA: 12/25/2023     PCP: Candise Aleene DEL, MD   Outpatient Specialists:   CARDS:  Dr. Redell Shallow, MD    Patient arrived to ER on 12/25/23 at 1428 Referred by Attending Silvester Ales, MD   Patient coming from:    From facility [name]    Chief Complaint:   Chief Complaint  Patient presents with   Shortness of Breath    HPI: Linda [name] is a 78 y.o. female with medical history significant of asthma, asbestosis, diastolic CHF, chronic back pain, chronic pain syndrome debility, chronic respiratory failure on 3 L    Presented with   wheezing shortness of breath cough Patient resides at independent living in [place].  She has significant asbestosis and chronic asthma chronically on 3 L of oxygen  States that ever since her admission to ICU 3 months ago she has not really feeling better.  But has been getting progressively worse.  Reports her past 2 to 3 weeks has been having cough productive of green mucus worsening shortness of breath increased wheezing she is followed by pulmonology and they been trying new treatments but very slow progress if any.  She was having significant wheezing and shortness of breath today.  EMS was called and administered albuterol  nebulizer x 2 patient has ongoing cough and chest tightness Patient denies any fevers or chills but does state cold.  Otherwise no leg swelling no abdominal pain no chest pain except irritation from coughing.  recently started on tezspire  but she is not sure if it is actually helping   Per pulmonology note they felt the symptoms related to asbestosis and developing left lower greater than right lower lobe fibrosis but has been progressive Prednisone  has not helped very much in the past  Denies significant ETOH intake   Does not smoke   Lab Results  Component Value Date   SARSCOV2NAA NEGATIVE 12/25/2023   SARSCOV2NAA NEGATIVE 07/10/2023    SARSCOV2NAA NEGATIVE 02/23/2023   SARSCOV2NAA NEGATIVE 02/27/2022        Regarding pertinent Chronic problems:     Hyperlipidemia -  on statins Crestor  Lipid Panel     Component Value Date/Time   CHOL 121 10/15/2023 1349   TRIG 152.0 (H) 10/15/2023 1349   HDL 39.50 10/15/2023 1349   CHOLHDL 3 10/15/2023 1349   VLDL 30.4 10/15/2023 1349   LDLCALC 51 10/15/2023 1349     HTN on Imdur  Toprol    chronic CHF diastolic/  -on Lasix  last echo  Recent Results (from the past 56199 hours)  ECHOCARDIOGRAM COMPLETE   Collection Time: 02/17/22  8:15 AM  Result Value   BP 148/87   S' Lateral 3.30   Area-P 1/2 2.03   Narrative      ECHOCARDIOGRAM REPORT       1. Left ventricular ejection fraction, by estimation, is 70 to 75%. The left ventricle has hyperdynamic function. The left ventricle has no regional wall motion abnormalities. Left ventricular diastolic parameters are consistent with Grade I diastolic  dysfunction (impaired relaxation).  2. Right ventricular systolic function is normal. The right ventricular size is normal. Tricuspid regurgitation signal is inadequate for assessing PA pressure.  3. The mitral valve is normal in structure. Trivial mitral valve regurgitation. No evidence of mitral stenosis.  4. The aortic valve is tricuspid. Aortic valve regurgitation is not visualized. Aortic valve sclerosis is present, with no evidence of aortic valve stenosis.  5. The inferior vena cava  is normal in size with greater than 50% respiratory variability, suggesting right atrial pressure of 3 mmHg.          Nonobstructive CAD 2 04/22/2013- On Aspirin , statin,                  -  followed by cardiology              Asthma -severe poorly controlled    COPD/pulmonary asbestosis/restrictive pulmonary disease with fibrosis-  followed by pulmonology  on baseline oxygen   3L,      While in ER: Clinical Course as of 12/25/23 1953  Thu Dec 25, 2023  1547 Creatinine(!): 1.13 [WS]  1547 WBC(!):  11.1 [WS]  1901 Patient does feel better but still has increased work of breath.  Chest x-ray read as no significant change but to me appears relatively different.  Ordered CT scan to further evaluate for any acute infiltrate or pneumonia.  Discussed with hospitalist Dr. Silvester who is admitted the patient for further management. [WS]    Clinical Course User Index [WS] Francesca Elsie CROME, MD         Lab Orders         Resp panel by RT-PCR (RSV, Flu A&B, Covid) Anterior Nasal Swab         Comprehensive metabolic panel         CBC         Procalcitonin         CBC with Differential/Platelet         CK         Magnesium          Phosphorus         Prealbumin         TSH         I-Stat venous blood gas, ED       CXR - 1. Redemonstration of multiple calcified bilateral pleural plaques. 2. Moderately decreased lung volumes, decreased from 08/28/2023. 3. Mild left-greater-than-right lower hemithorax ground-glass opacities are similar to prior radiographs and CT given differences in lung volumes and technique. This likely reflects a combination of subsegmental atelectasis, pleural plaques, and scarring.    Following Medications were ordered in ER: Medications  acetaminophen  (TYLENOL ) tablet 1,000 mg (1,000 mg Oral Patient Refused/Not Given 12/25/23 1834)  ipratropium-albuterol  (DUONEB) 0.5-2.5 (3) MG/3ML nebulizer solution 3 mL (has no administration in time range)  methylPREDNISolone  sodium succinate (SOLU-MEDROL ) 125 mg/2 mL injection 125 mg (125 mg Intravenous Given 12/25/23 1537)  ipratropium-albuterol  (DUONEB) 0.5-2.5 (3) MG/3ML nebulizer solution 3 mL (3 mLs Nebulization Given 12/25/23 1542)  sodium chloride  0.9 % bolus 1,000 mL (0 mLs Intravenous Stopped 12/25/23 1758)  ipratropium-albuterol  (DUONEB) 0.5-2.5 (3) MG/3ML nebulizer solution 3 mL (3 mLs Nebulization Given 12/25/23 1834)       ED Triage Vitals  Encounter Vitals Group     BP 12/25/23 1440 127/62     Systolic BP Percentile  --      Diastolic BP Percentile --      Pulse Rate 12/25/23 1440 82     Resp 12/25/23 1440 16     Temp 12/25/23 1440 97.9 F (36.6 C)     Temp Source 12/25/23 1440 Oral     SpO2 12/25/23 1440 96 %     Weight 12/25/23 1444 156 lb 4.9 oz (70.9 kg)     Height 12/25/23 1444 5' 4 (1.626 m)     Head Circumference --      Peak Flow --  Pain Score 12/25/23 1444 8     Pain Loc --      Pain Education --      Exclude from Growth Chart --   UFJK(75)@     _________________________________________ Significant initial  Findings: Abnormal Labs Reviewed  COMPREHENSIVE METABOLIC PANEL - Abnormal; Notable for the following components:      Result Value   Chloride 95 (*)    Glucose, Bld 109 (*)    Creatinine, Ser 1.13 (*)    Calcium  8.4 (*)    Albumin 2.8 (*)    GFR, Estimated 50 (*)    All other components within normal limits  CBC - Abnormal; Notable for the following components:   WBC 11.1 (*)    Hemoglobin 11.7 (*)    All other components within normal limits  I-STAT VENOUS BLOOD GAS, ED - Abnormal; Notable for the following components:   pH, Ven 7.461 (*)    pCO2, Ven 39.3 (*)    pO2, Ven 75 (*)    Acid-Base Excess 4.0 (*)    Potassium 3.1 (*)    Calcium , Ion 1.03 (*)    HCT 34.0 (*)    Hemoglobin 11.6 (*)    All other components within normal limits        ECG: Ordered Personally reviewed and interpreted by me showing: HR : 82 Rhythm: Sinus rhythm Right bundle branch block No significant change since last tracing QTC 496  BNP (last 3 results) Recent Labs    06/02/23 1635 07/06/23 1324  BNP 103.4* 83.4     COVID-19 Labs  No results for input(s): DDIMER, FERRITIN, LDH, CRP in the last 72 hours.  Lab Results  Component Value Date   SARSCOV2NAA NEGATIVE 12/25/2023   SARSCOV2NAA NEGATIVE 07/10/2023   SARSCOV2NAA NEGATIVE 02/23/2023   SARSCOV2NAA NEGATIVE 02/27/2022    The recent clinical data is shown below. Vitals:   12/25/23 1500 12/25/23 1530  12/25/23 1757 12/25/23 1757  BP: (!) 110/41 (!) 111/52  116/63  Pulse: 83 80  85  Resp: 15 18  16   Temp:   98.4 F (36.9 C)   TempSrc:   Oral   SpO2: 97% 94%  100%  Weight:      Height:        WBC     Component Value Date/Time   WBC 11.1 (H) 12/25/2023 1457   LYMPHSABS 2.5 08/19/2023 1027   LYMPHSABS 2.4 06/05/2015 1315   MONOABS 0.9 08/19/2023 1027   EOSABS 0.1 08/19/2023 1027   EOSABS 0.0 06/05/2015 1315   BASOSABS 0.0 08/19/2023 1027   BASOSABS 0.0 06/05/2015 1315     Procalcitonin   Ordered     Results for orders placed or performed during the hospital encounter of 12/25/23  Resp panel by RT-PCR (RSV, Flu A&B, Covid) Anterior Nasal Swab     Status: None   Collection Time: 12/25/23  2:57 PM   Specimen: Anterior Nasal Swab  Result Value Ref Range Status   SARS Coronavirus 2 by RT PCR NEGATIVE NEGATIVE Final   Influenza A by PCR NEGATIVE NEGATIVE Final   Influenza B by PCR NEGATIVE NEGATIVE Final         Resp Syncytial Virus by PCR NEGATIVE NEGATIVE Final            Venous  Blood Gas result:  pH   7.461 High  Sodium 137 mmol/L   pCO2, Ven 39.3 Low  mmHg Potassium 3.1 Low  mmol/L  pO2, Ven 75 High  mmHg  ABG    Component Value Date/Time   PHART 7.430 07/20/2013 2000   PCO2ART 41.0 07/20/2013 2000   PO2ART 57.4 (L) 07/20/2013 2000   HCO3 28.0 12/25/2023 1550   TCO2 29 12/25/2023 1550   ACIDBASEDEF 1.1 07/13/2013 1435   O2SAT 96 12/25/2023 1550    __________________________________________________________ Recent Labs  Lab 12/25/23 1457 12/25/23 1550  NA 136 137  K 3.6 3.1*  CO2 29  --   GLUCOSE 109*  --   BUN 14  --   CREATININE 1.13*  --   CALCIUM  8.4*  --     Cr  Up from baseline see below Lab Results  Component Value Date   CREATININE 1.13 (H) 12/25/2023   CREATININE 0.90 10/15/2023   CREATININE 0.78 08/19/2023    Recent Labs  Lab 12/25/23 1457  AST 19  ALT 11  ALKPHOS 61  BILITOT 0.5  PROT 6.8  ALBUMIN 2.8*   Lab Results   Component Value Date   CALCIUM  8.4 (L) 12/25/2023   PHOS 4.0 02/27/2022       Plt: Lab Results  Component Value Date   PLT 292 12/25/2023       Recent Labs  Lab 12/25/23 1457 12/25/23 1550  WBC 11.1*  --   HGB 11.7* 11.6*  HCT 36.7 34.0*  MCV 89.1  --   PLT 292  --     HG/HCT  stable,       Component Value Date/Time   HGB 11.6 (L) 12/25/2023 1550   HGB 13.6 06/05/2015 1315   HCT 34.0 (L) 12/25/2023 1550   HCT 34.4 01/02/2016 0610   HCT 40.8 06/05/2015 1315   MCV 89.1 12/25/2023 1457   MCV 92 06/05/2015 1315     _______________________________________________ Hospitalist was called for admission for   Severe persistent asthma/COPD pulmonary asbestosis with exacerbation    The following Work up has been ordered so far:  Orders Placed This Encounter  Procedures   Resp panel by RT-PCR (RSV, Flu A&B, Covid) Anterior Nasal Swab   DG Chest Port 1 View   CT Chest Wo Contrast   Comprehensive metabolic panel   CBC   Procalcitonin   CBC with Differential/Platelet   CK   Magnesium    Phosphorus   Prealbumin   TSH   ED Cardiac monitoring   Check Peak Flow   Initiate Carrier Fluid Protocol   Cardiac Monitoring - Continuous Indefinite   Do not attempt resuscitation (DNR)- Limited -Do Not Intubate (DNI)   Consult to hospitalist   Nutritional services consult   Consult to Palliative Care   I-Stat venous blood gas, ED   EKG 12-Lead   ED EKG   Saline lock IV   Admit to Inpatient (patient's expected length of stay will be greater than 2 midnights or inpatient only procedure)     OTHER Significant initial  Findings:  labs showing:     DM  labs:  HbA1C: No results for input(s): HGBA1C in the last 8760 hours.     CBG (last 3)  No results for input(s): GLUCAP in the last 72 hours.        Cultures:    Component Value Date/Time   SDES URINE, CLEAN CATCH 02/23/2023 2214   SPECREQUEST  02/23/2023 2214    NONE Performed at [facility], [address], [place], [place] [address]    CULT >=100,000 COLONIES/mL ESCHERICHIA COLI (A) 02/23/2023 2214   REPTSTATUS 02/25/2023 FINAL 02/23/2023 2214  Radiological Exams on Admission: DG Chest Port 1 View Result Date: 12/25/2023 CLINICAL DATA:  Shortness of breath. EXAM: PORTABLE CHEST 1 VIEW COMPARISON:  Chest radiographs 08/28/2023 and 07/18/2023; PET-CT 10/08/2023 FINDINGS: Redemonstration of multiple calcified bilateral pleural plaques. Moderately decreased lung volumes, decreased from 08/28/2023. Mild left-greater-than-right lower hemithorax ground-glass opacities are similar to prior radiographs and CT given differences in lung volumes and technique. No pneumothorax. No large pleural effusion is seen however the basilar opacities and pleural plaques limit this evaluation. Mild dextrocurvature of the midthoracic spine with mild-to-moderate multilevel degenerative disc changes. Visualized cardiac silhouette is unremarkable. Moderate to high-grade atherosclerotic calcifications within the aortic arch. IMPRESSION: 1. Redemonstration of multiple calcified bilateral pleural plaques. 2. Moderately decreased lung volumes, decreased from 08/28/2023. 3. Mild left-greater-than-right lower hemithorax ground-glass opacities are similar to prior radiographs and CT given differences in lung volumes and technique. This likely reflects a combination of subsegmental atelectasis, pleural plaques, and scarring. Electronically Signed   By: Tanda Lyons M.D.   On: 12/25/2023 16:56   _______________________________________________________________________________________________________ Latest  Blood pressure 116/63, pulse 85, temperature 98.4 F (36.9 C), temperature source Oral, resp. rate 16, height 5' 4 (1.626 m), weight 70.9 kg, SpO2 100%.   Vitals  labs and radiology finding personally reviewed  Review of Systems:    Pertinent positives include:  shortness of breath at rest.  dyspnea on exertion, productive  cough, wheezing. Constitutional:  No weight loss, night sweats, Fevers, chills, fatigue, weight loss  HEENT:  No headaches, Difficulty swallowing,Tooth/dental problems,Sore throat,  No sneezing, itching, ear ache, nasal congestion, post nasal drip,  Cardio-vascular:  No chest pain, Orthopnea, PND, anasarca, dizziness, palpitations.no Bilateral lower extremity swelling  GI:  No heartburn, indigestion, abdominal pain, nausea, vomiting, diarrhea, change in bowel habits, loss of appetite, melena, blood in stool, hematemesis Resp:    No excess mucus,   No non-productive cough, No coughing up of blood.No change in color of mucus.No  Skin:  no rash or lesions. No jaundice GU:  no dysuria, change in color of urine, no urgency or frequency. No straining to urinate.  No flank pain.  Musculoskeletal:  No joint pain or no joint swelling. No decreased range of motion. No back pain.  Psych:  No change in mood or affect. No depression or anxiety. No memory loss.  Neuro: no localizing neurological complaints, no tingling, no weakness, no double vision, no gait abnormality, no slurred speech, no confusion  All systems reviewed and apart from HOPI all are negative _______________________________________________________________________________________________ Past Medical History:   Past Medical History:  Diagnosis Date   Acute upper GI bleed 06/2014   while pt taking coumadin , plavix , and meloxicam ---despite being told not to take coumadin .   Anginal pain (HCC)    Nonobstructive CAD 2014; however, her cardiologist put her on a statin for this and NOT for hyperlipidemia per pt report.  Atyp CP 08/2017 at card f/u, plan for myoc perf imaging.   Anxiety    panic attacks   BPPV (benign paroxysmal positional vertigo) 12/16/2012   CAD (coronary artery disease)    Nonobstructive ([facility])   Chronic diastolic CHF (congestive heart failure) (HCC)    dry wt as of 11/06/16 is 168 lbs.   Chronic lower  back pain    DDD (degenerative disc disease)    lumbar and cervical.    Diverticular disease    Fibromyalgia    Patient states dx was around her late 48s but she had sx's for years prior to this.  H/O hiatal hernia    History of pneumonia    hospitalized 12/2011, 02/2013, and 07/2013 [facility]) for this   HTN (hypertension)    Renal artery dopplers 04/2013 neg for stenosis.   Hypervitaminosis D 09/27/2019   over-supplemented.  Stopped vit D and plan recheck 2 mo.   Idiopathic angio-edema-urticaria 27985; 2021   Angioedema component was very minimal.  2021->Dr. Bobbitt (allergist) eval.   Insomnia    Iron  deficiency anemia    Hematologist in [place], [place] did extensive w/u; no cause found; failed oral supplement;; gets fairly regular (q76m or so) IV iron  infusions (Venofer  -iron  sucrose- 200mg  with procrit .  for 14 yr I've been getting blood work q month & getting infusions prn (07/12/2013).  Dr. Timmy locally, iron  infusions done, EPO deficiency dx'd   Migraine syndrome    not as often anymore; used to be ~ q wk (07/12/2013)   Mixed incontinence urge and stress    Nephrolithiasis    passed all on my own or they are still in there (07/12/2013)   Neuroleptic induced parkinsonism (HCC) 2018   Dr. Evonnie, neuro, saw her 11/24/17 and recommended d/c of abilify  as first step.  D/c'd abilify  and pt got complete recovery.   Oropharyngeal dysphagia    swallowing study speech path 05/2020. Gastric bx's showed gastritis, h pylori NEG   OSA on CPAP    prior to move to [place]--had another sleep study 10/2015 w/pulm Dr. Nathanael.   Osteoarthritis    severe; progressing fast (07/12/2013); multiple joints-not surgical candidate for TKR (03/2015).  Triamcinolon knee injections by Dr. Arlana 12/2017.   Pernicious anemia 08/24/2014   Pleural plaque with presence of asbestos 07/22/2013   Pulmonary embolism (HCC) 07/2013   Dx at [facility] with very small peripheral upper lobe pe 07/2013: pt took coumadin  for about 8-9 mo    Pyelonephritis    several times over the last 30 yr (07/12/2013)   RBBB (right bundle branch block)    Recurrent major depression (HCC)    Recurrent UTI    hx of hospitalization for pyelonephritis; started abx prophylaxis 06/2015   Syncope    Hypotensive; ED visit--Dr. Levern did Cath--nonobstructive CAD, EF 55-60%.  In retrospect, suspect pt rx med misuse/polypharmacy   Vertebral compression fracture (HCC)    Acute T12 on 02/27/22 (fall).  multiple old thoracic-->neurosurg to do MRI as of 05/2022     Past Surgical History:  Procedure Laterality Date   APPENDECTOMY  1960   AXILLARY SURGERY Left 1978   Multiple lump in armpit per pt   BIOPSY  06/17/2020   Gastric bx->gastritis, h pylori neg.  Procedure: BIOPSY;  Surgeon: Rollin Dover, MD;  Location: [facility];  Service: Endoscopy;;   CARDIAC CATHETERIZATION  01/2013   nonobstructive CAD, EF 55-60%   CARDIOVASCULAR STRESS TEST  02/22/2015   Low risk myocard perf imaging; wall motion normal, normal EF   CARDIOVASCULAR STRESS TEST     09/2017 myo perf low risk   carotid duplex doppler  10/21/2017   01/2023 1-30% bilat-->rpt 1 yr   COCCYX REMOVAL  1972   CT CTA CORONARY W/CA SCORE W/CM &/OR WO/CM     04/02/23 96%'tile calcium  score, nonobst CAD-->crestor  increased to 40   DEXA  06/05/2017   T-score -3.1   DILATION AND CURETTAGE OF UTERUS  ? 1970's   ESOPHAGOGASTRODUODENOSCOPY N/A 07/19/2014   Gastritis found + in the setting of supratherapeutic INR, +plavix , + meloxicam .   ESOPHAGOGASTRODUODENOSCOPY (EGD) WITH PROPOFOL  N/A 06/17/2020  NO stricture or other prob to explain pt's dysphagia, dilation was done anyway.  Gastric bx's-->gastritis, h pylori neg. Procedure: ESOPHAGOGASTRODUODENOSCOPY (EGD) WITH PROPOFOL ;  Surgeon: Rollin Dover, MD;  Location: [facility];  Service: Endoscopy;  Laterality: N/A;   EYE SURGERY Left 2012-2013   injections for ~ 1 yr; don't really know what for (07/12/2013)   HEEL SPUR SURGERY Left 2008    kidney stone removal Right    KNEE SURGERY  2005   LEFT HEART CATHETERIZATION WITH CORONARY ANGIOGRAM N/A 01/30/2013   Procedure: LEFT HEART CATHETERIZATION WITH CORONARY ANGIOGRAM;  Surgeon: Rober LOISE Chroman, MD;  Location: [facility]ATH [facility];  Service: Cardiovascular;  Laterality: N/A;   MALONEY DILATION  06/17/2020   Procedure: AGAPITO DILATION;  Surgeon: Rollin Dover, MD;  Location: [facility];  Service: Endoscopy;;   nasolaryngoscopy     06/2023 during hosp for resp failure-->normal   PLANTAR FASCIA RELEASE Left 2008   SPIROMETRY  04/25/2014   In hosp for acute asthma/COPD flare: mixed obstructive and restrictive lung disease. The FEV1 is severely reduced at 45% predicted.  FEV1 signif decreased compared to prior spirometry 07/23/13.   TENDON RELEASE  1996   Right forearm and hand   TOTAL ABDOMINAL HYSTERECTOMY  1974   TRANSTHORACIC ECHOCARDIOGRAM  01/2013; 04/2014;08/2015; 09/2017   2014--NORMAL.  2015--focal basal septal hypertrophy, EF 55-60%, grade I diast dysfxn, mild LAE.  08/2015 EF 55-60%, nl LV syst fxn, grade I DD, valves wnl. 10/21/17: EF 65-70%, grd I DD, o/w normal. 02/17/22 EF 70-75%, hyperdynamic LV fxn, grd I DD.    Social History:  Ambulatory   independently       reports that she has never smoked. She has never used smokeless tobacco. She reports that she does not drink alcohol  and does not use drugs.     Family History:   Family History  Problem Relation Age of Onset   Arthritis Mother    Kidney disease Mother    Heart disease Father    Stroke Father    Hypertension Father    Diabetes Father    Heart attack Father    Heart attack Paternal Grandmother    Diabetes Sister        one sister   Hypertension Sister    Asthma Sister    Hypertension Brother    Asthma Brother    Asthma Daughter    Multiple sclerosis Son    ______________________________________________________________________________________________ Allergies: Allergies  Allergen Reactions    Aripiprazole  Rash and Other (See Comments)    Parkinsonism, also   Penicillins Shortness Of Breath, Itching, Swelling, Rash and Other (See Comments)    Tolerated Cefepime  in ED   Bactrim  [Sulfamethoxazole -Trimethoprim ] Rash     Prior to Admission medications   Medication Sig Start Date End Date Taking? Authorizing Provider  albuterol  (PROVENTIL ) (2.5 MG/3ML) 0.083% nebulizer solution Take 3 mLs (2.5 mg total) by nebulization every 6 (six) hours as needed for wheezing or shortness of breath. 09/23/23   Parrett, Madelin RAMAN, NP  albuterol  (VENTOLIN  HFA) 108 (90 Base) MCG/ACT inhaler Inhale 1-2 puffs into the lungs every 6 (six) hours as needed for wheezing or shortness of breath. 09/23/23   Parrett, Madelin RAMAN, NP  ALPRAZolam  (XANAX ) 0.5 MG tablet TAKE ONE TABLET BY MOUTH THREE TIMES DAILY 09/22/23   McGowen, Aleene DEL, MD  amLODipine  (NORVASC ) 5 MG tablet TAKE ONE TABLET BY MOUTH DAILY 02/24/23   McGowen, Aleene DEL, MD  aspirin  81 MG tablet Take 81 mg by mouth at bedtime.  [provider]  benzonatate  (TESSALON ) 200 MG capsule Take 1 capsule (200 mg total) by mouth 3 (three) times daily as needed. 09/23/23 09/22/24  Parrett, Madelin RAMAN, NP  budesonide  (PULMICORT ) 0.5 MG/2ML nebulizer solution Take 2 mLs (0.5 mg total) by nebulization in the morning and at bedtime. 10/29/23   McGowen, Aleene DEL, MD  buprenorphine  (BUTRANS ) 10 MCG/HR PTWK Place 1 patch onto the skin once a week. 11/12/23   Babs Arthea DASEN, MD  cyclobenzaprine  (FLEXERIL ) 5 MG tablet Take 5 mg by mouth 2 (two) times daily as needed. 11/02/23   [provider]  diclofenac  Sodium (VOLTAREN ) 1 % GEL Apply 2 g topically 4 (four) times daily. 4 times a day to both knees Patient taking differently: Apply 2 g topically 4 (four) times daily as needed (for pain- to affected areas). 08/07/22   Babs Arthea DASEN, MD  DULoxetine  (CYMBALTA ) 60 MG capsule Take 1 capsule (60 mg total) by mouth daily. 10/29/23   McGowen, Aleene DEL, MD  EPINEPHrine   0.3 mg/0.3 mL IJ SOAJ injection Inject 0.3 mg into the muscle as needed for anaphylaxis. 12/28/20   McGowen, Aleene DEL, MD  fluticasone  (FLONASE ) 50 MCG/ACT nasal spray Place 1 spray into both nostrils at bedtime. 07/21/23   Arlice Reichert, MD  formoterol  (PERFOROMIST ) 20 MCG/2ML nebulizer solution Take 2 mLs (20 mcg total) by nebulization 2 (two) times daily. 10/29/23   McGowen, Aleene DEL, MD  furosemide  (LASIX ) 20 MG tablet Take 1 tablet (20 mg total) by mouth daily. 07/22/23   Arlice Reichert, MD  gabapentin  (NEURONTIN ) 300 MG capsule Take 1 capsule (300 mg total) by mouth 2 (two) times daily. 10/29/23   McGowen, Aleene DEL, MD  ipratropium (ATROVENT ) 0.03 % nasal spray Place 2 sprays into both nostrils every 12 (twelve) hours as needed (allergies). 01/01/23   McGowen, Aleene DEL, MD  isosorbide  mononitrate (IMDUR ) 30 MG 24 hr tablet TAKE TWO TABLETS BY MOUTH IN THE MORNING AND TAKE TWO TABLETS AT BEDTIME 12/16/23   Pietro Redell RAMAN, MD  lamoTRIgine  (LAMICTAL ) 200 MG tablet TAKE ONE TABLET BY MOUTH DAILY 08/12/23   McGowen, Aleene DEL, MD  loratadine  (CLARITIN ) 10 MG tablet Take 1 tablet (10 mg total) by mouth daily as needed for allergies or rhinitis. 09/03/23 12/02/23  McGowen, Aleene DEL, MD  methocarbamol  (ROBAXIN ) 500 MG tablet Take 1 tablet (500 mg total) by mouth every 8 (eight) hours as needed for muscle spasms. 07/02/23   Babs Arthea DASEN, MD  metoprolol  succinate (TOPROL -XL) 100 MG 24 hr tablet Take 0.5-1 tablets (50-100 mg total) by mouth See admin instructions. Take 100 mg by mouth once a day and an additional 50 mg as needed for a B/P greater than 160/90 11/13/23   McGowen, Aleene DEL, MD  montelukast  (SINGULAIR ) 10 MG tablet Take 1 tablet (10 mg total) by mouth at bedtime. 09/03/23 10/03/23  McGowen, Aleene DEL, MD  Multiple Vitamin (MULTIVITAMIN WITH MINERALS) TABS tablet Take 1 tablet by mouth daily with breakfast.    [provider]  nitroGLYCERIN  (NITROSTAT ) 0.4 MG SL tablet Place 1 tablet (0.4 mg  total) under the tongue every 5 (five) minutes as needed for chest pain (x 3 doses). 01/08/23   Pietro Redell RAMAN, MD  oxyCODONE -acetaminophen  (PERCOCET) 10-325 MG tablet Take 1 tablet by mouth every 6 (six) hours as needed for pain. 12/11/23   Debby Fidela CROME, NP  OXYGEN  Inhale 3 L/min into the lungs continuous.    [provider]  pantoprazole  (PROTONIX ) 40 MG  tablet TAKE ONE TABLET BY MOUTH TWICE DAILY 10/20/23   McGowen, Aleene DEL, MD  predniSONE  (DELTASONE ) 5 MG tablet 2 tabs po every day x 10d then 1 tab po every day x 10d 10/29/23   McGowen, Aleene DEL, MD  rOPINIRole  (REQUIP ) 0.5 MG tablet TAKE ONE TABLET BY MOUTH AT BEDTIME Patient taking differently: Take 0.5 mg by mouth at bedtime as needed. TAKE ONE TABLET BY MOUTH AT BEDTIME 07/17/23   McGowen, Aleene DEL, MD  rosuvastatin  (CRESTOR ) 40 MG tablet Take 1 tablet (40 mg total) by mouth every evening. 04/02/23   Pietro Redell RAMAN, MD  solifenacin  (VESICARE ) 10 MG tablet Take 1 tablet (10 mg total) by mouth daily. 08/05/23   McGowen, Aleene DEL, MD  Spacer/Aero-Holding Raguel DEVI 1 puff by Does not apply route 2 (two) times daily. 09/01/23   Hunsucker, Donnice SAUNDERS, MD  tezepelumab -ekko (TEZSPIRE ) 210 MG/1. syringe Inject 210 mg into the skin every 28 (twenty-eight) days.    [provider]  traZODone  (DESYREL ) 50 MG tablet TAKE 2-4 TABLETS BY MOUTH AT BEDTIME AS NEEDED INSOMNIA 11/17/23   Candise Aleene DEL, MD    ___________________________________________________________________________________________________ Physical Exam:    12/25/2023    5:57 PM 12/25/2023    3:30 PM 12/25/2023    3:00 PM  Vitals with BMI  Systolic 116 111 889  Diastolic 63 52 41  Pulse 85 80 83     1. General:  in No  Acute distress but increased work of breathing   acutely ill -appearing 2. Psychological: Alert and   Oriented 3. Head/ENT:    Dry Mucous Membranes                          Head Non traumatic, neck supple                           Poor  Dentition 4. SKIN: normal   Skin turgor,  Skin clean Dry and intact no rash    5. Heart: Regular rate and rhythm no  Murmur, no Rub or gallop 6. Lungs: significant  wheezes some crackles   7. Abdomen: Soft,  non-tender, Non distended  bowel sounds present 8. Lower extremities: no clubbing, cyanosis, no  edema 9. Neurologically Grossly intact, moving all 4 extremities equally   10. MSK: Normal range of motion    Chart has been reviewed  ______________________________________________________________________________________________  Assessment/Plan  78 y.o. female with medical history significant of asthma, asbestosis, diastolic CHF, chronic back pain, chronic pain syndrome debility, chronic respiratory failure on 3 L  Admitted for  Severe persistent asthma/COPD pulmonary asbestosis with exacerbation    Present on Admission:  Asthma exacerbation  HTN (hypertension), benign  Acute on chronic respiratory failure (HCC)  Anxiety  CAD-minor 2014  Chronic diastolic CHF (congestive heart failure) (HCC)  Chronic pain syndrome     HTN (hypertension), benign Continue for tonight Imdur  but decrease dose to 30 mg p.o. twice daily Hold off on Toprol  given increased wheezing  Acute on chronic respiratory failure (HCC) In the setting of worsening COPD/asthma/pulmonary fibrosis Unclear if symptoms secondary to asthma exacerbation versus just progression of pulmonary fibrosis. For tonight continue with albuterol  as needed and scheduled DuoNeb Prednisone  taper Titrate oxygen  as needed Will need pulmonology consult and may benefit from palliative care consult as well   Provide O2 therapy and titrate as needed  Continuous pulse ox   check Pulse ox with  ambulation prior to discharge    flutter valve ordered   Anxiety Continue home dose of Xanax  but decrease frequency to twice daily as needed  Asthma exacerbation  -  - Will initiate: Steroid taper  -  Antibiotics azithromycin  given COPD  component - Albuterol   PRN, - scheduled duoneb,     -  Mucinex .  Titrate O2 to saturation >90%. Follow patients respiratory status.  influenza PCR neg     Currently mentating well no evidence of symptomatic hypercarbia   CAD-minor 2014 Hold aspirin  given increased wheezing, continue statin Crestor  40 mg/day Continue Imdur  30 mg.  bid  Chronic diastolic CHF (congestive heart failure) (HCC) Continue home dose of Lasix  20 mg a day monitor for any evidence of fluid overload  Chronic pain syndrome Restart home medications   Other plan as per orders.  DVT prophylaxis:  SCD     Code Status:    Code Status: Limited: Do not attempt resuscitation (DNR) -DNR-LIMITED -Do Not Intubate/DNI   DNR/DNI   as per patient   I had personally discussed CODE STATUS with patient  ACP   none     Family Communication:   Family not at  Bedside    Diet  heart healthy   Disposition Plan:                            Back to current facility when stable                               Following barriers for discharge:                             Work of breathing improved to baseline                           Will need consultants to evaluate patient prior to discharge       Consult Orders  (From admission, onward)           Start     Ordered   12/26/23 1907  Nutritional services consult  Once       Provider:  (Not yet assigned)  Question:  Reason for Consult?  Answer:  assess nutrtional status   12/25/23 1909   12/25/23 1909  Consult to Palliative Care  Once       Provider:  (Not yet assigned)  Question Answer Comment  Palliative Care Consult Services Palliative Medicine Consult   Reason for Consult? goals of care      12/25/23 1909   12/25/23 1818  Consult to hospitalist  Paged by wyvonne  Once       Provider:  (Not yet assigned)  Question Answer Comment  Place call to: [facility]   Reason for Consult Admit      12/25/23 1817                               Would  benefit from PT/OT eval prior to DC  Ordered                                       Palliative care    consulted  Consults called: PUlmonology to see in AM     Admission status:  ED Disposition     ED Disposition  Admit   Condition  --   Comment  Hospital Area: [facility] [[id number]]  Level of Care: Progressive [102]  Admit to Progressive based on following criteria: MULTISYSTEM THREATS such as stable sepsis, metabolic/electrolyte imbalance with or without encephalopathy that is responding to early treatment.  Admit to Progressive based on following criteria: RESPIRATORY PROBLEMS hypoxemic/hypercapnic respiratory failure that is responsive to NIPPV (BiPAP) or High Flow Nasal Cannula (6-80 lpm). Frequent assessment/intervention, no > Q2 hrs < Q4 hrs, to maintain oxygenation and pulmonary hygiene.  May admit patient to [facility] or [facility] if equivalent level of care is available:: No  Covid Evaluation: Asymptomatic - no recent exposure (last 10 days) testing not required  Diagnosis: Asthma exacerbation [302487]  Admitting Physician: Amarii Bordas [[id number]]  Attending Physician: Orvilla Truett [[id number]]  Certification:: I certify this patient will need inpatient services for at least 2 midnights  Expected Medical Readiness: 12/28/2023            inpatient     I Expect 2 midnight stay secondary to severity of patient's current illness need for inpatient interventions justified by the following:  hemodynamic instability despite optimal treatment (  tachypnea  hypoxia,  )   Severe lab/radiological/exam abnormalities including:    Pulmonary fibrosis and extensive comorbidities including:  Chronic pain     CHF  COPD/asthma    That are currently affecting medical management.   I expect  patient to be hospitalized for 2 midnights requiring inpatient medical care.  Patient is at high risk for adverse outcome (such as loss of life or  disability) if not treated.  Indication for inpatient stay as follows:    New or worsening hypoxia   Need for IV antibiotics,      Level of care          progressive          Lab Results  Component Value Date   SARSCOV2NAA NEGATIVE 12/25/2023     Kijana Estock 12/25/2023, 8:16 PM    [facility]     after 2 AM please page floor coverage PA If 7AM-7PM, please contact the day team taking care of the patient using [email or web address]

## 2023-12-25 NOTE — Consult Note (Signed)
 NAME:  Linda [name], MRN:  [id number], DOB:  1946/07/27, LOS: 0 ADMISSION DATE:  12/25/2023, CONSULTATION DATE:  12/25/23 REFERRING MD:  Dr. Silvester, MD CHIEF COMPLAINT:  asthma exacerbation   History of Present Illness:  Linda [name] is a 78 year old woman with history of restrictive lung disease and chronic hypoxemic respiratory failure in setting of asbesosis with pleural plaques along with asthma who presented to ER with wheezing and dyspnea.   PCCM consulted for asthma exacerbation. She has been given duoneb treatment and steroids. She reports feeling better already but continues to have some work of breathing.   She is baseline on 3L of O2, currently on 4-5L in ER. She denies any sick contacts. Reports some chills denies fevers. She does have cough with sputum production.   She is on 5mg  prednisone  daily.  Pertinent  Medical History   CAD CHF Chronic Back Pain [name] with pleural Plaques Chronic Hypoxemic Respiratory Failure  Significant Hospital Events: Including procedures, antibiotic start and stop dates in addition to other pertinent events   1/2 Admitted to [facility] for asthma exacerbation  Interim History / Subjective:  As above  Objective   Blood pressure 116/63, pulse 85, temperature 98.4 F (36.9 C), temperature source Oral, resp. rate 16, height 5' 4 (1.626 m), weight 70.9 kg, SpO2 100%.        Intake/Output Summary (Last 24 hours) at 12/25/2023 2022 Last data filed at 12/25/2023 1758 Gross per 24 hour  Intake 1000 ml  Output --  Net 1000 ml   Filed Weights   12/25/23 1444  Weight: 70.9 kg    Examination: General: elderly woman, mild respiratory distress HENT: [place]/AT, moist mucous membranes Lungs: diminished air movement, audible upper airway wheeze Cardiovascular: rrr, no murmurs Abdomen: soft, non-tender, non-distended Extremities: warm, no edema Neuro: alert, oriented, moving all extremities GU: n/a  Resolved Hospital Problem list      Assessment & Plan:  Acute on Chronic Hypoxemic Respiratory Failure Asthma with Acute Exacerbation Restrictive lung disease in setting of asbestosis with pleural plaques  Plan: - continue solumedrol 40mg  IV twice daily - duonebs q4hrs - check respiratory viral panel - Start ceftriaxone  + azithromycin  for CAP coverage - Follow up radiology read of CT Chest - PCCM to continue to follow  Best Practice (right click and Reselect all SmartList Selections daily)   Per primary  Labs   CBC: Recent Labs  Lab 12/25/23 1457 12/25/23 1550  WBC 11.1*  --   HGB 11.7* 11.6*  HCT 36.7 34.0*  MCV 89.1  --   PLT 292  --     Basic Metabolic Panel: Recent Labs  Lab 12/25/23 1457 12/25/23 1550  NA 136 137  K 3.6 3.1*  CL 95*  --   CO2 29  --   GLUCOSE 109*  --   BUN 14  --   CREATININE 1.13*  --   CALCIUM  8.4*  --    GFR: Estimated Creatinine Clearance: 40.3 mL/min (A) (by C-G formula based on SCr of 1.13 mg/dL (H)). Recent Labs  Lab 12/25/23 1457  PROCALCITON 0.11  WBC 11.1*    Liver Function Tests: Recent Labs  Lab 12/25/23 1457  AST 19  ALT 11  ALKPHOS 61  BILITOT 0.5  PROT 6.8  ALBUMIN 2.8*   No results for input(s): LIPASE, AMYLASE in the last 168 hours. No results for input(s): AMMONIA in the last 168 hours.  ABG    Component Value Date/Time   PHART 7.430  07/20/2013 2000   PCO2ART 41.0 07/20/2013 2000   PO2ART 57.4 (L) 07/20/2013 2000   HCO3 28.0 12/25/2023 1550   TCO2 29 12/25/2023 1550   ACIDBASEDEF 1.1 07/13/2013 1435   O2SAT 96 12/25/2023 1550     Coagulation Profile: No results for input(s): INR, PROTIME in the last 168 hours.  Cardiac Enzymes: No results for input(s): CKTOTAL, CKMB, CKMBINDEX, TROPONINI in the last 168 hours.  HbA1C: Hgb A1c MFr Bld  Date/Time Value Ref Range Status  02/27/2022 04:42 AM 5.3 4.8 - 5.6 % Final    Comment:    (NOTE) Pre diabetes:          5.7%-6.4%  Diabetes:               >6.4%  Glycemic control for   <7.0% adults with diabetes   01/02/2016 06:10 AM 5.6 4.8 - 5.6 % Final    Comment:    (NOTE)         Pre-diabetes: 5.7 - 6.4         Diabetes: >6.4         Glycemic control for adults with diabetes: <7.0     CBG: No results for input(s): GLUCAP in the last 168 hours.  Review of Systems:   Review of Systems  Constitutional:  Negative for chills, fever, malaise/fatigue and weight loss.  HENT:  Negative for congestion, sinus pain and sore throat.   Eyes: Negative.   Respiratory:  Positive for cough, sputum production, shortness of breath and wheezing. Negative for hemoptysis.   Cardiovascular:  Negative for chest pain, palpitations, orthopnea, claudication and leg swelling.  Gastrointestinal:  Negative for abdominal pain, heartburn, nausea and vomiting.  Genitourinary: Negative.   Musculoskeletal:  Positive for back pain. Negative for joint pain and myalgias.  Skin:  Negative for rash.  Neurological:  Negative for weakness.  Endo/Heme/Allergies: Negative.   Psychiatric/Behavioral: Negative.       Past Medical History:  She,  has a past medical history of Acute upper GI bleed (06/2014), Anginal pain (HCC), Anxiety, BPPV (benign paroxysmal positional vertigo) (12/16/2012), CAD (coronary artery disease), Chronic diastolic CHF (congestive heart failure) (HCC), Chronic lower back pain, DDD (degenerative disc disease), Diverticular disease, Fibromyalgia, H/O hiatal hernia, History of pneumonia, HTN (hypertension), Hypervitaminosis D (09/27/2019), Idiopathic angio-edema-urticaria (27985; 2021), Insomnia, Iron  deficiency anemia, Migraine syndrome, Mixed incontinence urge and stress, Nephrolithiasis, Neuroleptic induced parkinsonism (HCC) (2018), Oropharyngeal dysphagia, OSA on CPAP, Osteoarthritis, Pernicious anemia (08/24/2014), Pleural plaque with presence of asbestos (07/22/2013), Pulmonary embolism (HCC) (07/2013), Pyelonephritis, RBBB (right bundle branch  block), Recurrent major depression (HCC), Recurrent UTI, Syncope, and Vertebral compression fracture (HCC).   Surgical History:   Past Surgical History:  Procedure Laterality Date   APPENDECTOMY  1960   AXILLARY SURGERY Left 1978   Multiple lump in armpit per pt   BIOPSY  06/17/2020   Gastric bx->gastritis, h pylori neg.  Procedure: BIOPSY;  Surgeon: Rollin Dover, MD;  Location: [facility];  Service: Endoscopy;;   CARDIAC CATHETERIZATION  01/2013   nonobstructive CAD, EF 55-60%   CARDIOVASCULAR STRESS TEST  02/22/2015   Low risk myocard perf imaging; wall motion normal, normal EF   CARDIOVASCULAR STRESS TEST     09/2017 myo perf low risk   carotid duplex doppler  10/21/2017   01/2023 1-30% bilat-->rpt 1 yr   COCCYX REMOVAL  1972   CT CTA CORONARY W/CA SCORE W/CM &/OR WO/CM     04/02/23 96%'tile calcium  score, nonobst CAD-->crestor  increased to 40  DEXA  06/05/2017   T-score -3.1   DILATION AND CURETTAGE OF UTERUS  ? 1970's   ESOPHAGOGASTRODUODENOSCOPY N/A 07/19/2014   Gastritis found + in the setting of supratherapeutic INR, +plavix , + meloxicam .   ESOPHAGOGASTRODUODENOSCOPY (EGD) WITH PROPOFOL  N/A 06/17/2020   NO stricture or other prob to explain pt's dysphagia, dilation was done anyway.  Gastric bx's-->gastritis, h pylori neg. Procedure: ESOPHAGOGASTRODUODENOSCOPY (EGD) WITH PROPOFOL ;  Surgeon: Rollin Dover, MD;  Location: [facility];  Service: Endoscopy;  Laterality: N/A;   EYE SURGERY Left 2012-2013   injections for ~ 1 yr; don't really know what for (07/12/2013)   HEEL SPUR SURGERY Left 2008   kidney stone removal Right    KNEE SURGERY  2005   LEFT HEART CATHETERIZATION WITH CORONARY ANGIOGRAM N/A 01/30/2013   Procedure: LEFT HEART CATHETERIZATION WITH CORONARY ANGIOGRAM;  Surgeon: Rober LOISE Chroman, MD;  Location: [facility];  Service: Cardiovascular;  Laterality: N/A;   MALONEY DILATION  06/17/2020   Procedure: AGAPITO DILATION;  Surgeon: Rollin Dover, MD;   Location: [facility];  Service: Endoscopy;;   nasolaryngoscopy     06/2023 during hosp for resp failure-->normal   PLANTAR FASCIA RELEASE Left 2008   SPIROMETRY  04/25/2014   In hosp for acute asthma/COPD flare: mixed obstructive and restrictive lung disease. The FEV1 is severely reduced at 45% predicted.  FEV1 signif decreased compared to prior spirometry 07/23/13.   TENDON RELEASE  1996   Right forearm and hand   TOTAL ABDOMINAL HYSTERECTOMY  1974   TRANSTHORACIC ECHOCARDIOGRAM  01/2013; 04/2014;08/2015; 09/2017   2014--NORMAL.  2015--focal basal septal hypertrophy, EF 55-60%, grade I diast dysfxn, mild LAE.  08/2015 EF 55-60%, nl LV syst fxn, grade I DD, valves wnl. 10/21/17: EF 65-70%, grd I DD, o/w normal. 02/17/22 EF 70-75%, hyperdynamic LV fxn, grd I DD.     Social History:   reports that she has never smoked. She has never used smokeless tobacco. She reports that she does not drink alcohol  and does not use drugs.   Family History:  Her family history includes Arthritis in her mother; Asthma in her brother, daughter, and sister; Diabetes in her father and sister; Heart attack in her father and paternal grandmother; Heart disease in her father; Hypertension in her brother, father, and sister; Kidney disease in her mother; Multiple sclerosis in her son; Stroke in her father.   Allergies Allergies  Allergen Reactions   Aripiprazole  Rash and Other (See Comments)    Parkinsonism, also   Penicillins Shortness Of Breath, Itching, Swelling, Rash and Other (See Comments)    Tolerated Cefepime  in ED   Bactrim  [Sulfamethoxazole -Trimethoprim ] Rash     Home Medications  Prior to Admission medications   Medication Sig Start Date End Date Taking? Authorizing Provider  albuterol  (PROVENTIL ) (2.5 MG/3ML) 0.083% nebulizer solution Take 3 mLs (2.5 mg total) by nebulization every 6 (six) hours as needed for wheezing or shortness of breath. 09/23/23   Parrett, Madelin RAMAN, NP  albuterol  (VENTOLIN  HFA) 108  (90 Base) MCG/ACT inhaler Inhale 1-2 puffs into the lungs every 6 (six) hours as needed for wheezing or shortness of breath. 09/23/23   Parrett, Madelin RAMAN, NP  ALPRAZolam  (XANAX ) 0.5 MG tablet TAKE ONE TABLET BY MOUTH THREE TIMES DAILY 09/22/23   McGowen, Aleene DEL, MD  amLODipine  (NORVASC ) 5 MG tablet TAKE ONE TABLET BY MOUTH DAILY 02/24/23   McGowen, Aleene DEL, MD  aspirin  81 MG tablet Take 81 mg by mouth at bedtime.    [provider]  benzonatate  (TESSALON ) 200 MG capsule Take 1 capsule (200 mg total) by mouth 3 (three) times daily as needed. 09/23/23 09/22/24  Parrett, Madelin RAMAN, NP  budesonide  (PULMICORT ) 0.5 MG/2ML nebulizer solution Take 2 mLs (0.5 mg total) by nebulization in the morning and at bedtime. 10/29/23   McGowen, Aleene DEL, MD  buprenorphine  (BUTRANS ) 10 MCG/HR PTWK Place 1 patch onto the skin once a week. 11/12/23   Babs Arthea DASEN, MD  cyclobenzaprine  (FLEXERIL ) 5 MG tablet Take 5 mg by mouth 2 (two) times daily as needed. 11/02/23   [provider]  diclofenac  Sodium (VOLTAREN ) 1 % GEL Apply 2 g topically 4 (four) times daily. 4 times a day to both knees Patient taking differently: Apply 2 g topically 4 (four) times daily as needed (for pain- to affected areas). 08/07/22   Babs Arthea DASEN, MD  DULoxetine  (CYMBALTA ) 60 MG capsule Take 1 capsule (60 mg total) by mouth daily. 10/29/23   McGowen, Aleene DEL, MD  EPINEPHrine  0.3 mg/0.3 mL IJ SOAJ injection Inject 0.3 mg into the muscle as needed for anaphylaxis. 12/28/20   McGowen, Aleene DEL, MD  fluticasone  (FLONASE ) 50 MCG/ACT nasal spray Place 1 spray into both nostrils at bedtime. 07/21/23   Arlice Reichert, MD  formoterol  (PERFOROMIST ) 20 MCG/2ML nebulizer solution Take 2 mLs (20 mcg total) by nebulization 2 (two) times daily. 10/29/23   McGowen, Aleene DEL, MD  furosemide  (LASIX ) 20 MG tablet Take 1 tablet (20 mg total) by mouth daily. 07/22/23   Arlice Reichert, MD  gabapentin  (NEURONTIN ) 300 MG capsule Take 1 capsule (300 mg total) by  mouth 2 (two) times daily. 10/29/23   McGowen, Aleene DEL, MD  ipratropium (ATROVENT ) 0.03 % nasal spray Place 2 sprays into both nostrils every 12 (twelve) hours as needed (allergies). 01/01/23   McGowen, Aleene DEL, MD  isosorbide  mononitrate (IMDUR ) 30 MG 24 hr tablet TAKE TWO TABLETS BY MOUTH IN THE MORNING AND TAKE TWO TABLETS AT BEDTIME 12/16/23   Pietro Redell RAMAN, MD  lamoTRIgine  (LAMICTAL ) 200 MG tablet TAKE ONE TABLET BY MOUTH DAILY 08/12/23   McGowen, Aleene DEL, MD  loratadine  (CLARITIN ) 10 MG tablet Take 1 tablet (10 mg total) by mouth daily as needed for allergies or rhinitis. 09/03/23 12/02/23  McGowen, Aleene DEL, MD  methocarbamol  (ROBAXIN ) 500 MG tablet Take 1 tablet (500 mg total) by mouth every 8 (eight) hours as needed for muscle spasms. 07/02/23   Babs Arthea DASEN, MD  metoprolol  succinate (TOPROL -XL) 100 MG 24 hr tablet Take 0.5-1 tablets (50-100 mg total) by mouth See admin instructions. Take 100 mg by mouth once a day and an additional 50 mg as needed for a B/P greater than 160/90 11/13/23   McGowen, Aleene DEL, MD  montelukast  (SINGULAIR ) 10 MG tablet Take 1 tablet (10 mg total) by mouth at bedtime. 09/03/23 10/03/23  McGowen, Aleene DEL, MD  Multiple Vitamin (MULTIVITAMIN WITH MINERALS) TABS tablet Take 1 tablet by mouth daily with breakfast.    [provider]  nitroGLYCERIN  (NITROSTAT ) 0.4 MG SL tablet Place 1 tablet (0.4 mg total) under the tongue every 5 (five) minutes as needed for chest pain (x 3 doses). 01/08/23   Pietro Redell RAMAN, MD  oxyCODONE -acetaminophen  (PERCOCET) 10-325 MG tablet Take 1 tablet by mouth every 6 (six) hours as needed for pain. 12/11/23   Debby Fidela CROME, NP  OXYGEN  Inhale 3 L/min into the lungs continuous.    [provider]  pantoprazole  (PROTONIX ) 40 MG tablet TAKE ONE  TABLET BY MOUTH TWICE DAILY 10/20/23   McGowen, Aleene DEL, MD  predniSONE  (DELTASONE ) 5 MG tablet 2 tabs po every day x 10d then 1 tab po every day x 10d 10/29/23   McGowen, Aleene DEL, MD  rOPINIRole  (REQUIP ) 0.5 MG tablet TAKE ONE TABLET BY MOUTH AT BEDTIME Patient taking differently: Take 0.5 mg by mouth at bedtime as needed. TAKE ONE TABLET BY MOUTH AT BEDTIME 07/17/23   McGowen, Aleene DEL, MD  rosuvastatin  (CRESTOR ) 40 MG tablet Take 1 tablet (40 mg total) by mouth every evening. 04/02/23   Pietro Redell RAMAN, MD  solifenacin  (VESICARE ) 10 MG tablet Take 1 tablet (10 mg total) by mouth daily. 08/05/23   McGowen, Aleene DEL, MD  Spacer/Aero-Holding Raguel DEVI 1 puff by Does not apply route 2 (two) times daily. 09/01/23   Hunsucker, Donnice SAUNDERS, MD  tezepelumab -ekko (TEZSPIRE ) 210 MG/1. syringe Inject 210 mg into the skin every 28 (twenty-eight) days.    [provider]  traZODone  (DESYREL ) 50 MG tablet TAKE 2-4 TABLETS BY MOUTH AT BEDTIME AS NEEDED INSOMNIA 11/17/23   McGowen, Aleene DEL, MD     Critical care time: n/a    Dorn Chill, MD [facility] Office: [phone]   See Amion for personal pager PCCM on call pager [phone] until 7pm. Please call Elink 7p-7a. [phone]

## 2023-12-25 NOTE — ED Provider Notes (Addendum)
 [facility] 3E HF PCU Provider Note  CSN: [id number] Arrival date & time: 12/25/23 1428  Chief Complaint(s) Shortness of Breath  HPI [name] is a 78 y.o. female history of chronic hypoxic respiratory failure on oxygen , asthma, asbestosis, diastolic CHF presenting to the emergency department with shortness of breath.  Patient reports she has had persistent shortness of breath, cough since her hospitalization over the summer.  She reports that in the past couple of weeks this has gotten much worse.  She reports she has had productive cough with green phlegm, chills.  No fevers, sore throat, runny nose, congestion.  No abdominal pain.  She reports occasional chest pain with coughing but no chest pain otherwise.  No lightheadedness or dizziness.  Reports she was recently started on tezspire  for her asthma   Past Medical History Past Medical History:  Diagnosis Date   Acute upper GI bleed 06/2014   while pt taking coumadin , plavix , and meloxicam ---despite being told not to take coumadin .   Anginal pain (HCC)    Nonobstructive CAD 2014; however, her cardiologist put her on a statin for this and NOT for hyperlipidemia per pt report.  Atyp CP 08/2017 at card f/u, plan for myoc perf imaging.   Anxiety    panic attacks   BPPV (benign paroxysmal positional vertigo) 12/16/2012   CAD (coronary artery disease)    Nonobstructive ([place] [facility])   Chronic diastolic CHF (congestive heart failure) (HCC)    dry wt as of 11/06/16 is 168 lbs.   Chronic lower back pain    DDD (degenerative disc disease)    lumbar and cervical.    Diverticular disease    Fibromyalgia    Patient states dx was around her late 53s but she had sx's for years prior to this.   H/O hiatal hernia    History of pneumonia    hospitalized 12/2011, 02/2013, and 07/2013 [facility]) for this   HTN (hypertension)    Renal artery dopplers 04/2013 neg for stenosis.   Hypervitaminosis D 09/27/2019   over-supplemented.  Stopped  vit D and plan recheck 2 mo.   Idiopathic angio-edema-urticaria 27985; 2021   Angioedema component was very minimal.  2021->Dr. Bobbitt (allergist) eval.   Insomnia    Iron  deficiency anemia    Hematologist in [place], [place] did extensive w/u; no cause found; failed oral supplement;; gets fairly regular (q8m or so) IV iron  infusions (Venofer  -iron  sucrose- 200mg  with procrit .  for 14 yr I've been getting blood work q month & getting infusions prn (07/12/2013).  Dr. Timmy locally, iron  infusions done, EPO deficiency dx'd   Migraine syndrome    not as often anymore; used to be ~ q wk (07/12/2013)   Mixed incontinence urge and stress    Nephrolithiasis    passed all on my own or they are still in there (07/12/2013)   Neuroleptic induced parkinsonism (HCC) 2018   Dr. Evonnie, neuro, saw her 11/24/17 and recommended d/c of abilify  as first step.  D/c'd abilify  and pt got complete recovery.   Oropharyngeal dysphagia    swallowing study speech path 05/2020. Gastric bx's showed gastritis, h pylori NEG   OSA on CPAP    prior to move to [name]--had another sleep study 10/2015 w/pulm Dr. Nathanael.   Osteoarthritis    severe; progressing fast (07/12/2013); multiple joints-not surgical candidate for TKR (03/2015).  Triamcinolon knee injections by Dr. Arlana 12/2017.   Pernicious anemia 08/24/2014   Pleural plaque with presence of asbestos 07/22/2013  Pulmonary embolism (HCC) 07/2013   Dx at [facility] with very small peripheral upper lobe pe 07/2013: pt took coumadin  for about 8-9 mo   Pyelonephritis    several times over the last 30 yr (07/12/2013)   RBBB (right bundle branch block)    Recurrent major depression (HCC)    Recurrent UTI    hx of hospitalization for pyelonephritis; started abx prophylaxis 06/2015   Syncope    Hypotensive; ED visit--Dr. Levern did Cath--nonobstructive CAD, EF 55-60%.  In retrospect, suspect pt rx med misuse/polypharmacy   Vertebral compression fracture (HCC)    Acute T12 on 02/27/22  (fall).  multiple old thoracic-->neurosurg to do MRI as of 05/2022   Patient Active Problem List   Diagnosis Date Noted   Malnutrition of moderate degree 01/06/2024   Asthma with acute exacerbation 12/29/2023   Moderate asthma with acute exacerbation 12/25/2023   Severe persistent asthma, uncomplicated 11/05/2023   Acute non-recurrent sphenoidal sinusitis 07/14/2023   Acute bacterial sinusitis 07/13/2023   Moderate persistent asthma with acute exacerbation 07/13/2023   Acute respiratory failure with hypoxia (HCC) 07/11/2023   Subacute cough 07/10/2023   Acute on chronic hypoxic respiratory failure (HCC) 07/06/2023   Vocal cord dysfunction 07/06/2023   Thoracic vertebral fracture (HCC) 02/26/2022   Fall 02/16/2022   Maxillary fracture (HCC) 02/16/2022   Right orbital fracture (HCC) 02/16/2022   Right ureteral stone 11/30/2021   Chronic pain of left knee 10/18/2021   Spondylosis of lumbar spine 08/23/2021   Acute encephalopathy 11/24/2020   Dysphagia 06/15/2020   Allergic reaction 02/01/2020   Idiopathic angioedema 01/20/2020   Dysarthria 01/20/2020   Hypotension 10/04/2019   COPD (chronic obstructive pulmonary disease) (HCC) 01/21/2018   Pulmonary asbestosis (HCC) 01/21/2018   Hypomagnesemia 01/02/2018   Failure to thrive in adult 01/01/2018   Altered mental status 10/21/2017   Lumbar radiculitis 02/10/2017   Low serum erythropoietin  level 10/17/2016   RBBB 09/23/2016   Primary osteoarthritis of left knee 06/19/2016   Debilitated patient 06/06/2016   Chronic diastolic CHF (congestive heart failure) (HCC) 05/30/2016   Chronic respiratory failure with hypoxia (HCC) 02/07/2016   Restrictive lung disease 02/07/2016   Rotator cuff syndrome of right shoulder 01/10/2016   Encephalopathy, metabolic 01/01/2016   Fall at home 01/01/2016   Rhabdomyolysis 01/01/2016   Diastolic heart failure (HCC) 10/16/2015   CAD-minor 2014 08/16/2015   Chest pain with moderate risk for cardiac  etiology 08/16/2015   Narrowing of intervertebral disc space 07/17/2015   Bilateral lower leg pain 01/24/2015   Damage to right ulnar nerve 01/16/2015   Chronic pain syndrome 11/21/2014   Anxiety 11/21/2014   Hypokalemia 11/21/2014   Hyperlipidemia 11/21/2014   Chronic pain disorder 11/21/2014   Primary osteoarthritis of right knee 10/18/2014   Pernicious anemia 08/24/2014   Generalized anxiety disorder--with occasional panic attacks.  08/05/2014   Recurrent major depression-severe (HCC) 08/05/2014   Multifactorial gait disorder 07/26/2014   Epistaxis 07/18/2014   Acute GI bleeding 07/17/2014   Anemia associated with acute blood loss 07/17/2014   Syncope 07/17/2014   History of pulmonary embolism 07/17/2014   GI bleed 07/17/2014   Arthritis 05/11/2014   DDD (degenerative disc disease) 05/11/2014   Fibrositis 05/11/2014   Amianthosis (HCC) 05/11/2014   [name] (HCC) 05/11/2014   Grief 04/28/2014   OSA (obstructive sleep apnea) 04/24/2014   Chronic heart failure with preserved ejection fraction (HFpEF) (HCC) 04/24/2014   Atelectasis 08/06/2013   Acute pulmonary embolism (HCC) 08/04/2013   Pleural plaque with presence of  asbestos 07/22/2013   Polypharmacy 04/26/2013   Chronic pain 03/01/2013   Insomnia 11/12/2012   HTN (hypertension), benign 10/25/2012   Home Medication(s) Prior to Admission medications   Medication Sig Start Date End Date Taking? Authorizing Provider  albuterol  (PROVENTIL ) (2.5 MG/3ML) 0.083% nebulizer solution Take 3 mLs (2.5 mg total) by nebulization every 6 (six) hours as needed for wheezing or shortness of breath. 09/23/23  Yes Parrett, [name] S, NP  albuterol  (VENTOLIN  HFA) 108 (90 Base) MCG/ACT inhaler Inhale 1-2 puffs into the lungs every 6 (six) hours as needed for wheezing or shortness of breath. 09/23/23  Yes Parrett, [name] S, NP  ALPRAZolam  (XANAX ) 0.5 MG tablet TAKE ONE TABLET BY MOUTH THREE TIMES DAILY 09/22/23  Yes McGowen, Aleene DEL, MD   amLODipine  (NORVASC ) 5 MG tablet TAKE ONE TABLET BY MOUTH DAILY 02/24/23  Yes McGowen, Aleene DEL, MD  aspirin  81 MG tablet Take 81 mg by mouth at bedtime.   Yes [provider]  Budeson-Glycopyrrol-Formoterol  (BREZTRI  AEROSPHERE) 160-9-4.8 MCG/ACT AERO Inhale 2 puffs into the lungs 4 (four) times a week.   Yes [provider]  cyclobenzaprine  (FLEXERIL ) 5 MG tablet Take 5 mg by mouth 2 (two) times daily as needed. 11/02/23  Yes [provider]  diclofenac  Sodium (VOLTAREN ) 1 % GEL Apply 2 g topically 4 (four) times daily. 4 times a day to both knees Patient taking differently: Apply 2 g topically 4 (four) times daily as needed (for pain- to affected areas). 08/07/22  Yes Babs Arthea DASEN, MD  DULoxetine  (CYMBALTA ) 60 MG capsule Take 1 capsule (60 mg total) by mouth daily. 10/29/23  Yes McGowen, Aleene DEL, MD  EPINEPHrine  0.3 mg/0.3 mL IJ SOAJ injection Inject 0.3 mg into the muscle as needed for anaphylaxis. 12/28/20  Yes McGowen, Aleene DEL, MD  fluticasone  (FLONASE ) 50 MCG/ACT nasal spray Place 1 spray into both nostrils at bedtime. Patient taking differently: Place 1 spray into both nostrils daily as needed for allergies. 07/21/23  Yes Dahal, Chapman, MD  furosemide  (LASIX ) 20 MG tablet Take 1 tablet (20 mg total) by mouth daily. Patient taking differently: Take 20 mg by mouth daily as needed for edema. 07/22/23  Yes Dahal, Chapman, MD  gabapentin  (NEURONTIN ) 300 MG capsule Take 1 capsule (300 mg total) by mouth 2 (two) times daily. 10/29/23  Yes McGowen, Aleene DEL, MD  HYDROcodone  bit-homatropine (HYCODAN) 5-1.5 MG/5ML syrup Take 5 mLs by mouth every 6 (six) hours as needed for cough.   Yes [provider]  isosorbide  mononitrate (IMDUR ) 30 MG 24 hr tablet TAKE TWO TABLETS BY MOUTH IN THE MORNING AND TAKE TWO TABLETS AT BEDTIME 12/16/23  Yes Crenshaw, Redell RAMAN, MD  lamoTRIgine  (LAMICTAL ) 200 MG tablet TAKE ONE TABLET BY MOUTH DAILY 08/12/23  Yes McGowen, Aleene DEL, MD   loratadine  (CLARITIN ) 10 MG tablet Take 1 tablet (10 mg total) by mouth daily as needed for allergies or rhinitis. 09/03/23 12/24/24 Yes McGowen, Aleene DEL, MD  metoprolol  succinate (TOPROL -XL) 100 MG 24 hr tablet Take 0.5-1 tablets (50-100 mg total) by mouth See admin instructions. Take 100 mg by mouth once a day and an additional 50 mg as needed for a B/P greater than 160/90 Patient taking differently: Take 100 mg by mouth See admin instructions. Take 100 mg by mouth once a day and an additional 100-150mg  as needed for a B/P greater than 160/90 11/13/23  Yes McGowen, Aleene DEL, MD  montelukast  (SINGULAIR ) 10 MG tablet Take 1 tablet (10 mg total) by mouth  at bedtime. Patient taking differently: Take 10 mg by mouth every other day. 09/03/23 12/24/24 Yes McGowen, Aleene DEL, MD  Multiple Vitamin (MULTIVITAMIN WITH MINERALS) TABS tablet Take 1 tablet by mouth daily with breakfast.   Yes [provider]  nitroGLYCERIN  (NITROSTAT ) 0.4 MG SL tablet Place 1 tablet (0.4 mg total) under the tongue every 5 (five) minutes as needed for chest pain (x 3 doses). 01/08/23  Yes Pietro Redell RAMAN, MD  oxyCODONE -acetaminophen  (PERCOCET) 10-325 MG tablet Take 1 tablet by mouth every 6 (six) hours as needed for pain. 12/11/23  Yes Debby Fidela CROME, NP  OXYGEN  Inhale 3 L/min into the lungs continuous.   Yes [provider]  pantoprazole  (PROTONIX ) 40 MG tablet TAKE ONE TABLET BY MOUTH TWICE DAILY 10/20/23  Yes McGowen, Aleene DEL, MD  rOPINIRole  (REQUIP ) 0.5 MG tablet TAKE ONE TABLET BY MOUTH AT BEDTIME Patient taking differently: Take 0.5 mg by mouth at bedtime as needed. TAKE ONE TABLET BY MOUTH AT BEDTIME 07/17/23  Yes McGowen, Aleene DEL, MD  rosuvastatin  (CRESTOR ) 40 MG tablet Take 1 tablet (40 mg total) by mouth every evening. 04/02/23  Yes Pietro Redell RAMAN, MD  tezepelumab rudi (TEZSPIRE ) 210 MG/1. syringe Inject 210 mg into the skin every 28 (twenty-eight) days.   Yes [provider]  traZODone   (DESYREL ) 50 MG tablet TAKE 2-4 TABLETS BY MOUTH AT BEDTIME AS NEEDED INSOMNIA Patient taking differently: TAKE 2-4 TABLETS BY MOUTH AT BEDTIME 11/17/23  Yes McGowen, Aleene DEL, MD  benzonatate  (TESSALON ) 200 MG capsule Take 1 capsule (200 mg total) by mouth 3 (three) times daily as needed. Patient not taking: Reported on 12/25/2023 09/23/23 09/22/24  Parrett, Madelin RAMAN, NP  buprenorphine  (BUTRANS ) 10 MCG/HR PTWK Place 1 patch onto the skin once a week. Patient not taking: Reported on 12/25/2023 11/12/23   Babs Arthea DASEN, MD  predniSONE  (DELTASONE ) 5 MG tablet 2 tabs po every day x 10d then 1 tab po every day x 10d Patient not taking: Reported on 12/25/2023 10/29/23   McGowen, Philip H, MD  solifenacin  (VESICARE ) 10 MG tablet Take 1 tablet (10 mg total) by mouth daily. Patient not taking: Reported on 12/25/2023 08/05/23   McGowen, Philip H, MD  Spacer/Aero-Holding Chambers DEVI 1 puff by Does not apply route 2 (two) times daily. 09/01/23   Hunsucker, Donnice SAUNDERS, MD                                                                                                                                    Past Surgical History Past Surgical History:  Procedure Laterality Date   APPENDECTOMY  1960   AXILLARY SURGERY Left 1978   Multiple lump in armpit per pt   BIOPSY  06/17/2020   Gastric bx->gastritis, h pylori neg.  Procedure: BIOPSY;  Surgeon: Rollin [place], MD;  Location: [facility];  Service: Endoscopy;;   CARDIAC CATHETERIZATION  01/2013   nonobstructive CAD, EF 55-60%  CARDIOVASCULAR STRESS TEST  02/22/2015   Low risk myocard perf imaging; wall motion normal, normal EF   CARDIOVASCULAR STRESS TEST     09/2017 myo perf low risk   carotid duplex doppler  10/21/2017   01/2023 1-30% bilat-->rpt 1 yr   COCCYX REMOVAL  1972   CT CTA CORONARY W/CA SCORE W/CM &/OR WO/CM     04/02/23 96%'tile calcium  score, nonobst CAD-->crestor  increased to 40   DEXA  06/05/2017   T-score -3.1   DILATION AND CURETTAGE OF  UTERUS  ? 1970's   ESOPHAGOGASTRODUODENOSCOPY N/A 07/19/2014   Gastritis found + in the setting of supratherapeutic INR, +plavix , + meloxicam .   ESOPHAGOGASTRODUODENOSCOPY (EGD) WITH PROPOFOL  N/A 06/17/2020   NO stricture or other prob to explain pt's dysphagia, dilation was done anyway.  Gastric bx's-->gastritis, h pylori neg. Procedure: ESOPHAGOGASTRODUODENOSCOPY (EGD) WITH PROPOFOL ;  Surgeon: Rollin [place], MD;  Location: [facility];  Service: Endoscopy;  Laterality: N/A;   EYE SURGERY Left 2012-2013   injections for ~ 1 yr; don't really know what for (07/12/2013)   HEEL SPUR SURGERY Left 2008   kidney stone removal Right    KNEE SURGERY  2005   LEFT HEART CATHETERIZATION WITH CORONARY ANGIOGRAM N/A 01/30/2013   Procedure: LEFT HEART CATHETERIZATION WITH CORONARY ANGIOGRAM;  Surgeon: Rober LOISE Chroman, MD;  Location: [facility];  Service: Cardiovascular;  Laterality: N/A;   MALONEY DILATION  06/17/2020   Procedure: AGAPITO DILATION;  Surgeon: Rollin [place], MD;  Location: [facility];  Service: Endoscopy;;   nasolaryngoscopy     06/2023 during hosp for resp failure-->normal   PLANTAR FASCIA RELEASE Left 2008   SPIROMETRY  04/25/2014   In hosp for acute asthma/COPD flare: mixed obstructive and restrictive lung disease. The FEV1 is severely reduced at 45% predicted.  FEV1 signif decreased compared to prior spirometry 07/23/13.   TENDON RELEASE  1996   Right forearm and hand   TOTAL ABDOMINAL HYSTERECTOMY  1974   TRANSTHORACIC ECHOCARDIOGRAM  01/2013; 04/2014;08/2015; 09/2017   2014--NORMAL.  2015--focal basal septal hypertrophy, EF 55-60%, grade I diast dysfxn, mild LAE.  08/2015 EF 55-60%, nl LV syst fxn, grade I DD, valves wnl. 10/21/17: EF 65-70%, grd I DD, o/w normal. 02/17/22 EF 70-75%, hyperdynamic LV fxn, grd I DD.   Family History Family History  Problem Relation Age of Onset   Arthritis Mother    Kidney disease Mother    Heart disease Father    Stroke Father    Hypertension  Father    Diabetes Father    Heart attack Father    Heart attack Paternal Grandmother    Diabetes Sister        one sister   Hypertension Sister    Asthma Sister    Hypertension Brother    Asthma Brother    Asthma Daughter    Multiple sclerosis Son     Social History Social History   Tobacco Use   Smoking status: Never   Smokeless tobacco: Never   Tobacco comments:    never used tobacco  Vaping Use   Vaping status: Never Used  Substance Use Topics   Alcohol  use: No    Alcohol /week: 0.0 standard drinks of alcohol    Drug use: No   Allergies Aripiprazole , Penicillins, and Bactrim  [sulfamethoxazole -trimethoprim ]  Review of Systems Review of Systems  All other systems reviewed and are negative.   Physical Exam Vital Signs  I have reviewed the triage vital signs BP 112/61 (BP Location: Left Arm)   Pulse  76   Temp 97.6 F (36.4 C) (Oral)   Resp 18   Ht 5' 4 (1.626 m)   Wt 68.2 kg   SpO2 96%   BMI 25.81 kg/m  Physical Exam Vitals and nursing note reviewed.  Constitutional:      General: She is not in acute distress.    Appearance: She is well-developed.  HENT:     Head: Normocephalic and atraumatic.     Mouth/Throat:     Mouth: Mucous membranes are moist.  Eyes:     Pupils: Pupils are equal, round, and reactive to light.  Cardiovascular:     Rate and Rhythm: Normal rate and regular rhythm.     Heart sounds: No murmur heard. Pulmonary:     Comments: Tachypnea, increased work of breathing, not speaking in full sentences, diffuse wheezes on exam Abdominal:     General: Abdomen is flat.     Palpations: Abdomen is soft.     Tenderness: There is no abdominal tenderness.  Musculoskeletal:        General: No tenderness.     Right lower leg: No edema.     Left lower leg: No edema.  Skin:    General: Skin is warm and dry.  Neurological:     General: No focal deficit present.     Mental Status: She is alert. Mental status is at baseline.  Psychiatric:         Mood and Affect: Mood normal.        Behavior: Behavior normal.     ED Results and Treatments Labs (all labs ordered are listed, but only abnormal results are displayed) Labs Reviewed  COMPREHENSIVE METABOLIC PANEL - Abnormal; Notable for the following components:      Result Value   Chloride 95 (*)    Glucose, Bld 109 (*)    Creatinine, Ser 1.13 (*)    Calcium  8.4 (*)    Albumin 2.8 (*)    GFR, Estimated 50 (*)    All other components within normal limits  CBC - Abnormal; Notable for the following components:   WBC 11.1 (*)    Hemoglobin 11.7 (*)    All other components within normal limits  PREALBUMIN - Abnormal; Notable for the following components:   Prealbumin 11 (*)    All other components within normal limits  COMPREHENSIVE METABOLIC PANEL - Abnormal; Notable for the following components:   Potassium 3.1 (*)    Glucose, Bld 160 (*)    Creatinine, Ser 1.11 (*)    Calcium  8.4 (*)    Total Protein 6.4 (*)    Albumin 2.6 (*)    GFR, Estimated 51 (*)    All other components within normal limits  CBC - Abnormal; Notable for the following components:   RBC 3.74 (*)    Hemoglobin 10.4 (*)    HCT 33.0 (*)    All other components within normal limits  COMPREHENSIVE METABOLIC PANEL - Abnormal; Notable for the following components:   Potassium 3.1 (*)    Glucose, Bld 141 (*)    Calcium  8.6 (*)    Albumin 2.8 (*)    All other components within normal limits  COMPREHENSIVE METABOLIC PANEL - Abnormal; Notable for the following components:   Potassium 2.9 (*)    Glucose, Bld 158 (*)    Albumin 2.9 (*)    All other components within normal limits  BRAIN NATRIURETIC PEPTIDE - Abnormal; Notable for the following components:   B Natriuretic Peptide  307.3 (*)    All other components within normal limits  COMPREHENSIVE METABOLIC PANEL - Abnormal; Notable for the following components:   Glucose, Bld 118 (*)    Calcium  8.4 (*)    Total Protein 6.4 (*)    Albumin 2.7 (*)     All other components within normal limits  SEDIMENTATION RATE - Abnormal; Notable for the following components:   Sed Rate 41 (*)    All other components within normal limits  COMPREHENSIVE METABOLIC PANEL - Abnormal; Notable for the following components:   Potassium 3.0 (*)    Chloride 96 (*)    Glucose, Bld 163 (*)    Calcium  8.5 (*)    Total Protein 6.1 (*)    Albumin 2.5 (*)    All other components within normal limits  COMPREHENSIVE METABOLIC PANEL - Abnormal; Notable for the following components:   Glucose, Bld 135 (*)    BUN 25 (*)    Calcium  8.3 (*)    Total Protein 6.0 (*)    Albumin 2.6 (*)    All other components within normal limits  CBC - Abnormal; Notable for the following components:   WBC 11.9 (*)    All other components within normal limits  COMPREHENSIVE METABOLIC PANEL - Abnormal; Notable for the following components:   Potassium 5.5 (*)    Glucose, Bld 118 (*)    BUN 28 (*)    Creatinine, Ser 1.04 (*)    Calcium  8.5 (*)    Total Protein 6.0 (*)    Albumin 2.7 (*)    GFR, Estimated 55 (*)    All other components within normal limits  COMPREHENSIVE METABOLIC PANEL - Abnormal; Notable for the following components:   Glucose, Bld 132 (*)    Creatinine, Ser 1.20 (*)    Calcium  8.6 (*)    Total Protein 5.7 (*)    Albumin 2.7 (*)    GFR, Estimated 47 (*)    All other components within normal limits  GLUCOSE, CAPILLARY - Abnormal; Notable for the following components:   Glucose-Capillary 148 (*)    All other components within normal limits  GLUCOSE, CAPILLARY - Abnormal; Notable for the following components:   Glucose-Capillary 123 (*)    All other components within normal limits  GLUCOSE, CAPILLARY - Abnormal; Notable for the following components:   Glucose-Capillary 182 (*)    All other components within normal limits  GLUCOSE, CAPILLARY - Abnormal; Notable for the following components:   Glucose-Capillary 162 (*)    All other components within  normal limits  GLUCOSE, CAPILLARY - Abnormal; Notable for the following components:   Glucose-Capillary 102 (*)    All other components within normal limits  BASIC METABOLIC PANEL - Abnormal; Notable for the following components:   Glucose, Bld 110 (*)    Calcium  8.6 (*)    All other components within normal limits  CBC WITH DIFFERENTIAL/PLATELET - Abnormal; Notable for the following components:   WBC 10.6 (*)    Hemoglobin 11.6 (*)    All other components within normal limits  I-STAT VENOUS BLOOD GAS, ED - Abnormal; Notable for the following components:   pH, Ven 7.461 (*)    pCO2, Ven 39.3 (*)    pO2, Ven 75 (*)    Acid-Base Excess 4.0 (*)    Potassium 3.1 (*)    Calcium , Ion 1.03 (*)    HCT 34.0 (*)    Hemoglobin 11.6 (*)    All other components within normal  limits  RESP PANEL BY RT-PCR (RSV, FLU A&B, COVID)  [id number]  RESPIRATORY PANEL BY PCR  MRSA NEXT GEN BY PCR, NASAL  PROCALCITONIN  LEGIONELLA PNEUMOPHILA SEROGP 1 UR AG  STREP PNEUMONIAE URINARY ANTIGEN  MAGNESIUM   PHOSPHORUS  ALLERGENS W/TOTAL IGE AREA 2  PROCALCITONIN  LEGIONELLA PNEUMOPHILA SEROGP 1 UR AG  MAGNESIUM   MAGNESIUM   PHOSPHORUS  GLUCOSE, CAPILLARY  TROPONIN I (HIGH SENSITIVITY)                                                                                                                          Radiology No results found.   Pertinent labs & imaging results that were available during my care of the patient were reviewed by me and considered in my medical decision making (see MDM for details).  Medications Ordered in ED Medications  acetaminophen  (TYLENOL ) tablet 1,000 mg (1,000 mg Oral Patient Refused/Not Given 12/25/23 1834)  DULoxetine  (CYMBALTA ) DR capsule 60 mg (60 mg Oral Given 01/07/24 1011)  montelukast  (SINGULAIR ) tablet 10 mg (10 mg Oral Given 01/06/24 2009)  pantoprazole  (PROTONIX ) EC tablet 40 mg (40 mg Oral Given 01/07/24 1011)  rOPINIRole  (REQUIP ) tablet 0.5 mg (has no administration  in time range)  acetaminophen  (TYLENOL ) tablet 650 mg (has no administration in time range)    Or  acetaminophen  (TYLENOL ) suppository 650 mg (has no administration in time range)  ondansetron  (ZOFRAN ) tablet 4 mg ( Oral See Alternative 12/31/23 1325)    Or  ondansetron  (ZOFRAN ) injection 4 mg (4 mg Intravenous Given 12/31/23 1325)  sodium chloride  flush (NS) 0.9 % injection 3 mL (3 mLs Intravenous Given 01/07/24 1012)  sodium chloride  flush (NS) 0.9 % injection 3 mL (3 mLs Intravenous Given 12/27/23 1006)  0.9 %  sodium chloride  infusion (has no administration in time range)  guaiFENesin  (MUCINEX ) 12 hr tablet 600 mg (600 mg Oral Given 01/07/24 1011)  ipratropium-albuterol  (DUONEB) 0.5-2.5 (3) MG/3ML nebulizer solution 3 mL (3 mLs Nebulization Given 12/31/23 1940)  revefenacin  (YUPELRI ) nebulizer solution 175 mcg (175 mcg Nebulization Given 01/07/24 0837)  arformoterol  (BROVANA ) nebulizer solution 15 mcg (15 mcg Nebulization Given 01/07/24 0837)  budesonide  (PULMICORT ) nebulizer solution 0.25 mg (0.25 mg Nebulization Given 01/07/24 0837)  enoxaparin  (LOVENOX ) injection 40 mg (40 mg Subcutaneous Given 01/06/24 2019)  lamoTRIgine  (LAMICTAL ) tablet 200 mg (200 mg Oral Given 01/07/24 1011)  fluticasone  (FLONASE ) 50 MCG/ACT nasal spray 2 spray (2 sprays Each Nare Given 01/07/24 1012)  potassium chloride  (KLOR-CON ) packet 40 mEq (40 mEq Oral Patient Refused/Not Given 12/28/23 1816)  loratadine  (CLARITIN ) tablet 10 mg (10 mg Oral Given 01/07/24 1011)  multivitamin with minerals tablet 1 tablet (1 tablet Oral Given 01/07/24 1011)  levofloxacin  (LEVAQUIN ) tablet 500 mg (500 mg Oral Given 01/06/24 1320)  famotidine  (PEPCID ) tablet 20 mg (20 mg Oral Given 01/06/24 2009)  gabapentin  (NEURONTIN ) capsule 400 mg (400 mg Oral Given 01/07/24 1010)  ramelteon  (ROZEREM ) tablet 8 mg (8 mg Oral Not Given 01/06/24 2245)  traZODone  (DESYREL )  tablet 300 mg (300 mg Oral Given 01/06/24 2130)  cyclobenzaprine  (FLEXERIL ) tablet 5 mg (5 mg  Oral Given 01/07/24 1011)  benzonatate  (TESSALON ) capsule 200 mg (200 mg Oral Given 01/07/24 1010)  clonazePAM  (KLONOPIN ) tablet 2 mg (2 mg Oral Given 01/06/24 2130)  clonazePAM  (KLONOPIN ) tablet 1 mg (1 mg Oral Given 01/07/24 1011)  predniSONE  (DELTASONE ) tablet 5 mg (5 mg Oral Given 01/07/24 0753)  lidocaine  2% Nebulized 5 mL (5 mLs Nebulization Given 01/07/24 0837)  menthol -cetylpyridinium (CEPACOL) lozenge 3 mg (has no administration in time range)  oxyCODONE -acetaminophen  (PERCOCET/ROXICET) 5-325 MG per tablet 1 tablet (1 tablet Oral Given 01/07/24 0753)    And  oxyCODONE  (Oxy IR/ROXICODONE ) immediate release tablet 7.5 mg (7.5 mg Oral Given 01/07/24 0753)  feeding supplement (BOOST / RESOURCE BREEZE) liquid 1 Container (1 Container Oral Given 01/07/24 1012)  HYDROcodone  bit-homatropine (HYCODAN) 5-1.5 MG/5ML syrup 15 mL (15 mLs Oral Given 01/07/24 0754)  scopolamine  (TRANSDERM-SCOP) 1 MG/3DAYS 1.5 mg (1.5 mg Transdermal Patch Applied 01/06/24 1325)  polyethylene glycol (MIRALAX  / GLYCOLAX ) packet 17 g (17 g Oral Given 01/07/24 1010)  methylPREDNISolone  sodium succinate (SOLU-MEDROL ) 125 mg/2 mL injection 125 mg (125 mg Intravenous Given 12/25/23 1537)  ipratropium-albuterol  (DUONEB) 0.5-2.5 (3) MG/3ML nebulizer solution 3 mL (3 mLs Nebulization Given 12/25/23 1542)  sodium chloride  0.9 % bolus 1,000 mL (0 mLs Intravenous Stopped 12/25/23 1758)  ipratropium-albuterol  (DUONEB) 0.5-2.5 (3) MG/3ML nebulizer solution 3 mL (3 mLs Nebulization Given 12/25/23 1834)  methylPREDNISolone  sodium succinate (SOLU-MEDROL ) 40 mg/mL injection 40 mg (40 mg Intravenous Given 12/26/23 1826)  methylPREDNISolone  sodium succinate (SOLU-MEDROL ) 40 mg/mL injection 40 mg (40 mg Intravenous Given 12/27/23 2151)    Followed by  predniSONE  (DELTASONE ) tablet 40 mg (40 mg Oral Given 12/30/23 0650)  potassium chloride  SA (KLOR-CON  M) CR tablet 40 mEq (40 mEq Oral Given 12/28/23 2025)  potassium chloride  SA (KLOR-CON  M) CR tablet 40 mEq (40 mEq  Oral Given 12/30/23 1118)  potassium chloride  SA (KLOR-CON  M) CR tablet 40 mEq (40 mEq Oral Given 12/30/23 1320)  potassium chloride  SA (KLOR-CON  M) CR tablet 40 mEq (40 mEq Oral Given 12/31/23 1736)  clotrimazole  (MYCELEX ) troche 10 mg (10 mg Oral Given 01/05/24 9161)                                                                                                                                     Procedures .Critical Care  Performed by: Francesca Elsie CROME, MD Authorized by: Francesca Elsie CROME, MD   Critical care provider statement:    Critical care time (minutes):  30   Critical care was necessary to treat or prevent imminent or life-threatening deterioration of the following conditions:  Respiratory failure   Critical care was time spent personally by me on the following activities:  Development of treatment plan with patient or surrogate, discussions with consultants, evaluation of patient's response to treatment, examination of patient, ordering and review of  laboratory studies, ordering and review of radiographic studies, ordering and performing treatments and interventions, pulse oximetry, re-evaluation of patient's condition and review of old charts   Care discussed with: admitting provider     (including critical care time)  Medical Decision Making / ED Course   MDM:  78 year old female presenting to the emergency department with shortness of breath.  Patient overall well-appearing but in mild respiratory distress .  Not hypoxic on her typical oxygen .  Suspect definitely component of asthma given wheezing, will give DuoNeb, steroids.  Did receive some albuterol  with the paramedics.  She does report some improvement.  She appears very short of breath.  Given recent productive cough, differential also includes pneumonia.  On my interpretation chest x-ray does show possible infiltrates.  Will reassess.  Doubt pneumothorax.  She reports some chest pain with cough, will check EKG but  doubt any cardiac cause of symptoms.  Doubt pulmonary embolism given wheezing and cough as well as chills without any pleuritic chest pain or hypoxia, tachycardia  Clinical Course as of 01/07/24 1135  Thu Dec 25, 2023  1547 Creatinine(!): 1.13 [WS]  1547 WBC(!): 11.1 [WS]  1901 Patient does feel better but still has increased work of breath.  Chest x-ray read as no significant change but to me appears relatively different.  Ordered CT scan to further evaluate for any acute infiltrate or pneumonia.  Discussed with hospitalist Dr. Silvester who is admitted the patient for further management. [WS]    Clinical Course User Index [WS] Francesca Elsie CROME, MD     Additional history obtained: -Additional history obtained from ems -External records from outside source obtained and reviewed including: Chart review including previous notes, labs, imaging, consultation notes including prior dc summaries    Lab Tests: -I ordered, reviewed, and interpreted labs.   The pertinent results include:   Labs Reviewed  COMPREHENSIVE METABOLIC PANEL - Abnormal; Notable for the following components:      Result Value   Chloride 95 (*)    Glucose, Bld 109 (*)    Creatinine, Ser 1.13 (*)    Calcium  8.4 (*)    Albumin 2.8 (*)    GFR, Estimated 50 (*)    All other components within normal limits  CBC - Abnormal; Notable for the following components:   WBC 11.1 (*)    Hemoglobin 11.7 (*)    All other components within normal limits  PREALBUMIN - Abnormal; Notable for the following components:   Prealbumin 11 (*)    All other components within normal limits  COMPREHENSIVE METABOLIC PANEL - Abnormal; Notable for the following components:   Potassium 3.1 (*)    Glucose, Bld 160 (*)    Creatinine, Ser 1.11 (*)    Calcium  8.4 (*)    Total Protein 6.4 (*)    Albumin 2.6 (*)    GFR, Estimated 51 (*)    All other components within normal limits  CBC - Abnormal; Notable for the following components:   RBC  3.74 (*)    Hemoglobin 10.4 (*)    HCT 33.0 (*)    All other components within normal limits  COMPREHENSIVE METABOLIC PANEL - Abnormal; Notable for the following components:   Potassium 3.1 (*)    Glucose, Bld 141 (*)    Calcium  8.6 (*)    Albumin 2.8 (*)    All other components within normal limits  COMPREHENSIVE METABOLIC PANEL - Abnormal; Notable for the following components:   Potassium 2.9 (*)  Glucose, Bld 158 (*)    Albumin 2.9 (*)    All other components within normal limits  BRAIN NATRIURETIC PEPTIDE - Abnormal; Notable for the following components:   B Natriuretic Peptide 307.3 (*)    All other components within normal limits  COMPREHENSIVE METABOLIC PANEL - Abnormal; Notable for the following components:   Glucose, Bld 118 (*)    Calcium  8.4 (*)    Total Protein 6.4 (*)    Albumin 2.7 (*)    All other components within normal limits  SEDIMENTATION RATE - Abnormal; Notable for the following components:   Sed Rate 41 (*)    All other components within normal limits  COMPREHENSIVE METABOLIC PANEL - Abnormal; Notable for the following components:   Potassium 3.0 (*)    Chloride 96 (*)    Glucose, Bld 163 (*)    Calcium  8.5 (*)    Total Protein 6.1 (*)    Albumin 2.5 (*)    All other components within normal limits  COMPREHENSIVE METABOLIC PANEL - Abnormal; Notable for the following components:   Glucose, Bld 135 (*)    BUN 25 (*)    Calcium  8.3 (*)    Total Protein 6.0 (*)    Albumin 2.6 (*)    All other components within normal limits  CBC - Abnormal; Notable for the following components:   WBC 11.9 (*)    All other components within normal limits  COMPREHENSIVE METABOLIC PANEL - Abnormal; Notable for the following components:   Potassium 5.5 (*)    Glucose, Bld 118 (*)    BUN 28 (*)    Creatinine, Ser 1.04 (*)    Calcium  8.5 (*)    Total Protein 6.0 (*)    Albumin 2.7 (*)    GFR, Estimated 55 (*)    All other components within normal limits   COMPREHENSIVE METABOLIC PANEL - Abnormal; Notable for the following components:   Glucose, Bld 132 (*)    Creatinine, Ser 1.20 (*)    Calcium  8.6 (*)    Total Protein 5.7 (*)    Albumin 2.7 (*)    GFR, Estimated 47 (*)    All other components within normal limits  GLUCOSE, CAPILLARY - Abnormal; Notable for the following components:   Glucose-Capillary 148 (*)    All other components within normal limits  GLUCOSE, CAPILLARY - Abnormal; Notable for the following components:   Glucose-Capillary 123 (*)    All other components within normal limits  GLUCOSE, CAPILLARY - Abnormal; Notable for the following components:   Glucose-Capillary 182 (*)    All other components within normal limits  GLUCOSE, CAPILLARY - Abnormal; Notable for the following components:   Glucose-Capillary 162 (*)    All other components within normal limits  GLUCOSE, CAPILLARY - Abnormal; Notable for the following components:   Glucose-Capillary 102 (*)    All other components within normal limits  BASIC METABOLIC PANEL - Abnormal; Notable for the following components:   Glucose, Bld 110 (*)    Calcium  8.6 (*)    All other components within normal limits  CBC WITH DIFFERENTIAL/PLATELET - Abnormal; Notable for the following components:   WBC 10.6 (*)    Hemoglobin 11.6 (*)    All other components within normal limits  I-STAT VENOUS BLOOD GAS, ED - Abnormal; Notable for the following components:   pH, Ven 7.461 (*)    pCO2, Ven 39.3 (*)    pO2, Ven 75 (*)    Acid-Base Excess 4.0 (*)  Potassium 3.1 (*)    Calcium , Ion 1.03 (*)    HCT 34.0 (*)    Hemoglobin 11.6 (*)    All other components within normal limits  RESP PANEL BY RT-PCR (RSV, FLU A&B, COVID)  [id number]  RESPIRATORY PANEL BY PCR  MRSA NEXT GEN BY PCR, NASAL  PROCALCITONIN  LEGIONELLA PNEUMOPHILA SEROGP 1 UR AG  STREP PNEUMONIAE URINARY ANTIGEN  MAGNESIUM   PHOSPHORUS  ALLERGENS W/TOTAL IGE AREA 2  PROCALCITONIN  LEGIONELLA PNEUMOPHILA SEROGP  1 UR AG  MAGNESIUM   MAGNESIUM   PHOSPHORUS  GLUCOSE, CAPILLARY  TROPONIN I (HIGH SENSITIVITY)    Notable for mild AKI, mild leukocytosis   EKG   EKG Interpretation Date/Time:  Friday December 26 2023 05:51:18 EST Ventricular Rate:  85 PR Interval:  157 QRS Duration:  158 QT Interval:  422 QTC Calculation: 502 R Axis:   26  Text Interpretation: Sinus rhythm Consider left atrial enlargement Right bundle branch block Confirmed by Theadore Sharper [phone]) on 12/27/2023 11:41:52 PM         Imaging Studies ordered: I ordered imaging studies including CXR On my interpretation imaging demonstrates ?LLL infiltrate  I independently visualized and interpreted imaging. I agree with the radiologist interpretation   Medicines ordered and prescription drug management: Meds ordered this encounter  Medications   methylPREDNISolone  sodium succinate (SOLU-MEDROL ) 125 mg/2 mL injection 125 mg    IV methylprednisolone  will be converted to either a q12h or q24h frequency with the same total daily dose (TDD).  Ordered Dose: 1 to 125 mg TDD; convert to: TDD q24h.  Ordered Dose: 126 to 250 mg TDD; convert to: TDD div q12h.  Ordered Dose: >250 mg TDD; DAW.   ipratropium-albuterol  (DUONEB) 0.5-2.5 (3) MG/3ML nebulizer solution 3 mL   sodium chloride  0.9 % bolus 1,000 mL   ipratropium-albuterol  (DUONEB) 0.5-2.5 (3) MG/3ML nebulizer solution 3 mL   acetaminophen  (TYLENOL ) tablet 1,000 mg   DISCONTD: ipratropium-albuterol  (DUONEB) 0.5-2.5 (3) MG/3ML nebulizer solution 3 mL   DISCONTD: albuterol  (PROVENTIL ) (2.5 MG/3ML) 0.083% nebulizer solution 2.5 mg   DISCONTD: ALPRAZolam  (XANAX ) tablet 0.5 mg    TAKE ONE TABLET BY MOUTH THREE TIMES DAILY.     DULoxetine  (CYMBALTA ) DR capsule 60 mg   DISCONTD: furosemide  (LASIX ) tablet 20 mg   DISCONTD: gabapentin  (NEURONTIN ) capsule 300 mg   DISCONTD: isosorbide  mononitrate (IMDUR ) 24 hr tablet 30 mg   DISCONTD: loratadine  (CLARITIN ) tablet 10 mg   montelukast   (SINGULAIR ) tablet 10 mg   DISCONTD: oxyCODONE -acetaminophen  (PERCOCET) 10-325 MG per tablet 1 tablet   pantoprazole  (PROTONIX ) EC tablet 40 mg   rOPINIRole  (REQUIP ) tablet 0.5 mg    TAKE ONE TABLET BY MOUTH AT BEDTIME     DISCONTD: fesoterodine  (TOVIAZ ) tablet 4 mg   OR Linked Order Group    acetaminophen  (TYLENOL ) tablet 650 mg    acetaminophen  (TYLENOL ) suppository 650 mg   OR Linked Order Group    ondansetron  (ZOFRAN ) tablet 4 mg    ondansetron  (ZOFRAN ) injection 4 mg   methylPREDNISolone  sodium succinate (SOLU-MEDROL ) 40 mg/mL injection 40 mg    IV methylprednisolone  will be converted to either a q12h or q24h frequency with the same total daily dose (TDD).  Ordered Dose: 1 to 125 mg TDD; convert to: TDD q24h.  Ordered Dose: 126 to 250 mg TDD; convert to: TDD div q12h.  Ordered Dose: >250 mg TDD; DAW.   DISCONTD: predniSONE  (DELTASONE ) tablet 40 mg   DISCONTD: azithromycin  (ZITHROMAX ) 500 mg in sodium chloride  0.9 % 250 mL  IVPB    Antibiotic Indication::   Other Indication (list below)    Other Indication::   COPD exacerbation   DISCONTD: azithromycin  (ZITHROMAX ) tablet 500 mg   sodium chloride  flush (NS) 0.9 % injection 3 mL   sodium chloride  flush (NS) 0.9 % injection 3 mL   0.9 %  sodium chloride  infusion   guaiFENesin  (MUCINEX ) 12 hr tablet 600 mg   DISCONTD: methylPREDNISolone  sodium succinate (SOLU-MEDROL ) 40 mg/mL injection 40 mg   DISCONTD: cefTRIAXone  (ROCEPHIN ) 2 g in sodium chloride  0.9 % 100 mL IVPB    Antibiotic Indication::   CAP   DISCONTD: azithromycin  (ZITHROMAX ) 500 mg in sodium chloride  0.9 % 250 mL IVPB    Antibiotic Indication::   CAP   DISCONTD: guaiFENesin  (ROBITUSSIN) 100 MG/5ML liquid 10 mL   DISCONTD: ipratropium-albuterol  (DUONEB) 0.5-2.5 (3) MG/3ML nebulizer solution 3 mL   DISCONTD: ipratropium-albuterol  (DUONEB) 0.5-2.5 (3) MG/3ML nebulizer solution 3 mL   DISCONTD: azithromycin  (ZITHROMAX ) tablet 500 mg   DISCONTD: oxyCODONE -acetaminophen   (PERCOCET/ROXICET) 5-325 MG per tablet 1 tablet    Refill:  0   DISCONTD: oxyCODONE  (Oxy IR/ROXICODONE ) immediate release tablet 5 mg    Refill:  0   DISCONTD: azithromycin  (ZITHROMAX ) tablet 500 mg   DISCONTD: albuterol  (PROVENTIL ) (2.5 MG/3ML) 0.083% nebulizer solution 2.5 mg   DISCONTD: metoprolol  succinate (TOPROL -XL) 24 hr tablet 100 mg    OP DPH:Ujxz 100 mg by mouth once a day and an additional 50 mg as needed for a B/P greater than 160/90 Patient taking differently: Take 100 mg by mouth once a day and an additional 100-150mg  as needed for a B/P greater than 160/90     DISCONTD: amLODipine  (NORVASC ) tablet 5 mg   ipratropium-albuterol  (DUONEB) 0.5-2.5 (3) MG/3ML nebulizer solution 3 mL   revefenacin  (YUPELRI ) nebulizer solution 175 mcg   arformoterol  (BROVANA ) nebulizer solution 15 mcg   budesonide  (PULMICORT ) nebulizer solution 0.25 mg   enoxaparin  (LOVENOX ) injection 40 mg   DISCONTD: traZODone  (DESYREL ) tablet 100 mg   lamoTRIgine  (LAMICTAL ) tablet 200 mg   DISCONTD: cyclobenzaprine  (FLEXERIL ) tablet 5 mg   DISCONTD: traZODone  (DESYREL ) tablet 200 mg   DISCONTD: HYDROcodone  bit-homatropine (HYCODAN) 5-1.5 MG/5ML syrup 5 mL    Refill:  0   FOLLOWED BY Linked Order Group    methylPREDNISolone  sodium succinate (SOLU-MEDROL ) 40 mg/mL injection 40 mg     IV methylprednisolone  will be converted to either a q12h or q24h frequency with the same total daily dose (TDD).  Ordered Dose: 1 to 125 mg TDD; convert to: TDD q24h.  Ordered Dose: 126 to 250 mg TDD; convert to: TDD div q12h.  Ordered Dose: >250 mg TDD; DAW.    predniSONE  (DELTASONE ) tablet 40 mg   fluticasone  (FLONASE ) 50 MCG/ACT nasal spray 2 spray   potassium chloride  (KLOR-CON ) packet 40 mEq   DISCONTD: furosemide  (LASIX ) injection 40 mg   loratadine  (CLARITIN ) tablet 10 mg   DISCONTD: cefTRIAXone  (ROCEPHIN ) 1 g in sodium chloride  0.9 % 100 mL IVPB    Antibiotic Indication::   Other Indication (list below)   DISCONTD:  feeding supplement (ENSURE ENLIVE / ENSURE PLUS) liquid 237 mL   multivitamin with minerals tablet 1 tablet   potassium chloride  SA (KLOR-CON  M) CR tablet 40 mEq   DISCONTD: magic mouthwash w/lidocaine    DISCONTD: HYDROcodone  bit-homatropine (HYCODAN) 5-1.5 MG/5ML syrup 10 mL    Refill:  0   levofloxacin  (LEVAQUIN ) tablet 500 mg   famotidine  (PEPCID ) tablet 20 mg   potassium chloride  SA (KLOR-CON  M)  CR tablet 40 mEq   DISCONTD: ALPRAZolam  (XANAX ) tablet 0.5 mg    TAKE ONE TABLET BY MOUTH THREE TIMES DAILY.     DISCONTD: traZODone  (DESYREL ) tablet 300 mg   potassium chloride  SA (KLOR-CON  M) CR tablet 40 mEq   gabapentin  (NEURONTIN ) capsule 400 mg   DISCONTD: furosemide  (LASIX ) tablet 40 mg   potassium chloride  SA (KLOR-CON  M) CR tablet 40 mEq   DISCONTD: clonazePAM  (KLONOPIN ) tablet 0.5 mg   ramelteon  (ROZEREM ) tablet 8 mg   DISCONTD: temazepam  (RESTORIL ) capsule 7.5 mg   DISCONTD: polyethylene glycol (MIRALAX  / GLYCOLAX ) packet 17 g   DISCONTD: lidocaine  2% Nebulized 5 mL   clotrimazole  (MYCELEX ) troche 10 mg   DISCONTD: temazepam  (RESTORIL ) capsule 30 mg   traZODone  (DESYREL ) tablet 300 mg   DISCONTD: clonazePAM  (KLONOPIN ) tablet 0.5 mg   cyclobenzaprine  (FLEXERIL ) tablet 5 mg   benzonatate  (TESSALON ) capsule 200 mg   DISCONTD: clonazePAM  (KLONOPIN ) tablet 1 mg   DISCONTD: clonazePAM  (KLONOPIN ) tablet 0.5 mg   clonazePAM  (KLONOPIN ) tablet 2 mg   clonazePAM  (KLONOPIN ) tablet 1 mg   predniSONE  (DELTASONE ) tablet 5 mg   lidocaine  2% Nebulized 5 mL   menthol -cetylpyridinium (CEPACOL) lozenge 3 mg   AND Linked Order Group    oxyCODONE  (Oxy IR/ROXICODONE ) immediate release tablet 7.5 mg     Refill:  0    oxyCODONE -acetaminophen  (PERCOCET/ROXICET) 5-325 MG per tablet 1 tablet     Refill:  0   feeding supplement (BOOST / RESOURCE BREEZE) liquid 1 Container   HYDROcodone  bit-homatropine (HYCODAN) 5-1.5 MG/5ML syrup 15 mL    Refill:  0   scopolamine  (TRANSDERM-SCOP) 1 MG/3DAYS  1.5 mg   polyethylene glycol (MIRALAX  / GLYCOLAX ) packet 17 g    -I have reviewed the patients home medicines and have made adjustments as needed   Consultations Obtained: I requested consultation with the hospitalist,  and discussed lab and imaging findings as well as pertinent plan - they recommend: admission   Cardiac Monitoring: The patient was maintained on a cardiac monitor.  I personally viewed and interpreted the cardiac monitored which showed an underlying rhythm of: NSR  Social Determinants of Health:  Diagnosis or treatment significantly limited by social determinants of health: lives alone   Reevaluation: After the interventions noted above, I reevaluated the patient and found that their symptoms have improved  Co morbidities that complicate the patient evaluation  Past Medical History:  Diagnosis Date   Acute upper GI bleed 06/2014   while pt taking coumadin , plavix , and meloxicam ---despite being told not to take coumadin .   Anginal pain (HCC)    Nonobstructive CAD 2014; however, her cardiologist put her on a statin for this and NOT for hyperlipidemia per pt report.  Atyp CP 08/2017 at card f/u, plan for myoc perf imaging.   Anxiety    panic attacks   BPPV (benign paroxysmal positional vertigo) 12/16/2012   CAD (coronary artery disease)    Nonobstructive ([place] [facility])   Chronic diastolic CHF (congestive heart failure) (HCC)    dry wt as of 11/06/16 is 168 lbs.   Chronic lower back pain    DDD (degenerative disc disease)    lumbar and cervical.    Diverticular disease    Fibromyalgia    Patient states dx was around her late 7s but she had sx's for years prior to this.   H/O hiatal hernia    History of pneumonia    hospitalized 12/2011, 02/2013, and 07/2013 [facility]) for this   HTN (  hypertension)    Renal artery dopplers 04/2013 neg for stenosis.   Hypervitaminosis D 09/27/2019   over-supplemented.  Stopped vit D and plan recheck 2 mo.   Idiopathic  angio-edema-urticaria 27985; 2021   Angioedema component was very minimal.  2021->Dr. Bobbitt (allergist) eval.   Insomnia    Iron  deficiency anemia    Hematologist in [place], [place] did extensive w/u; no cause found; failed oral supplement;; gets fairly regular (q39m or so) IV iron  infusions (Venofer  -iron  sucrose- 200mg  with procrit .  for 14 yr I've been getting blood work q month & getting infusions prn (07/12/2013).  Dr. Timmy locally, iron  infusions done, EPO deficiency dx'd   Migraine syndrome    not as often anymore; used to be ~ q wk (07/12/2013)   Mixed incontinence urge and stress    Nephrolithiasis    passed all on my own or they are still in there (07/12/2013)   Neuroleptic induced parkinsonism (HCC) 2018   Dr. Evonnie, neuro, saw her 11/24/17 and recommended d/c of abilify  as first step.  D/c'd abilify  and pt got complete recovery.   Oropharyngeal dysphagia    swallowing study speech path 05/2020. Gastric bx's showed gastritis, h pylori NEG   OSA on CPAP    prior to move to [place]--had another sleep study 10/2015 w/pulm Dr. Nathanael.   Osteoarthritis    severe; progressing fast (07/12/2013); multiple joints-not surgical candidate for TKR (03/2015).  Triamcinolon knee injections by Dr. Arlana 12/2017.   Pernicious anemia 08/24/2014   Pleural plaque with presence of asbestos 07/22/2013   Pulmonary embolism (HCC) 07/2013   Dx at [facility] with very small peripheral upper lobe pe 07/2013: pt took coumadin  for about 8-9 mo   Pyelonephritis    several times over the last 30 yr (07/12/2013)   RBBB (right bundle branch block)    Recurrent major depression (HCC)    Recurrent UTI    hx of hospitalization for pyelonephritis; started abx prophylaxis 06/2015   Syncope    Hypotensive; ED visit--Dr. Levern did Cath--nonobstructive CAD, EF 55-60%.  In retrospect, suspect pt rx med misuse/polypharmacy   Vertebral compression fracture (HCC)    Acute T12 on 02/27/22 (fall).  multiple old thoracic-->neurosurg  to do MRI as of 05/2022      Dispostion: Disposition decision including need for hospitalization was considered, and patient admitted to the hospital.    Final Clinical Impression(s) / ED Diagnoses Final diagnoses:  Severe persistent asthma with exacerbation     This chart was dictated using voice recognition software.  Despite best efforts to proofread,  errors can occur which can change the documentation meaning.    Francesca Elsie CROME, MD 12/25/23 [place]    Francesca Elsie CROME, MD 01/07/24 1135

## 2023-12-25 NOTE — ED Triage Notes (Signed)
 Pt arrived via [facility] from [facility] for SOB and [id number]. Pt told EMS productive cough w/green mucousx2-3wks. Pt c/o chills and bodyaches. Pt wears 3L 02 per [place] at baseline. EMS gave albuterol  nebx2 total of 10 mg. Per EMS, pt c/o chest tightness w/cough

## 2023-12-25 NOTE — Subjective & Objective (Signed)
 Patient resides at independent living in [place].  She has significant asbestosis and chronic asthma chronically on 3 L of oxygen  States that ever since her admission to ICU 3 months ago she has not really feeling better.  But has been getting progressively worse.  Reports her past 2 to 3 weeks has been having cough productive of green mucus worsening shortness of breath increased wheezing she is followed by pulmonology and they been trying new treatments but very slow progress if any.  She was having significant wheezing and shortness of breath today.  EMS was called and administered albuterol  nebulizer x 2 patient has ongoing cough and chest tightness Patient denies any fevers or chills but does state cold.  Otherwise no leg swelling no abdominal pain no chest pain except irritation from coughing.  recently started on tezspire  but she is not sure if it is actually helping

## 2023-12-25 NOTE — Assessment & Plan Note (Signed)
 Hold aspirin given increased wheezing, continue statin Crestor 40 mg/day Continue Imdur 30 mg.  bid

## 2023-12-26 DIAGNOSIS — J9621 Acute and chronic respiratory failure with hypoxia: Secondary | ICD-10-CM | POA: Diagnosis not present

## 2023-12-26 DIAGNOSIS — Z7189 Other specified counseling: Secondary | ICD-10-CM | POA: Diagnosis not present

## 2023-12-26 DIAGNOSIS — J189 Pneumonia, unspecified organism: Principal | ICD-10-CM

## 2023-12-26 DIAGNOSIS — J4551 Severe persistent asthma with (acute) exacerbation: Secondary | ICD-10-CM

## 2023-12-26 DIAGNOSIS — Z515 Encounter for palliative care: Secondary | ICD-10-CM

## 2023-12-26 LAB — CBC
HCT: 33 % — ABNORMAL LOW (ref 36.0–46.0)
Hemoglobin: 10.4 g/dL — ABNORMAL LOW (ref 12.0–15.0)
MCH: 27.8 pg (ref 26.0–34.0)
MCHC: 31.5 g/dL (ref 30.0–36.0)
MCV: 88.2 fL (ref 80.0–100.0)
Platelets: 266 10*3/uL (ref 150–400)
RBC: 3.74 MIL/uL — ABNORMAL LOW (ref 3.87–5.11)
RDW: 12.7 % (ref 11.5–15.5)
WBC: 8.6 10*3/uL (ref 4.0–10.5)
nRBC: 0 % (ref 0.0–0.2)

## 2023-12-26 LAB — MRSA NEXT GEN BY PCR, NASAL: MRSA by PCR Next Gen: NOT DETECTED

## 2023-12-26 LAB — COMPREHENSIVE METABOLIC PANEL
ALT: 11 U/L (ref 0–44)
AST: 19 U/L (ref 15–41)
Albumin: 2.6 g/dL — ABNORMAL LOW (ref 3.5–5.0)
Alkaline Phosphatase: 50 U/L (ref 38–126)
Anion gap: 11 (ref 5–15)
BUN: 13 mg/dL (ref 8–23)
CO2: 24 mmol/L (ref 22–32)
Calcium: 8.4 mg/dL — ABNORMAL LOW (ref 8.9–10.3)
Chloride: 101 mmol/L (ref 98–111)
Creatinine, Ser: 1.11 mg/dL — ABNORMAL HIGH (ref 0.44–1.00)
GFR, Estimated: 51 mL/min — ABNORMAL LOW (ref >60)
Glucose, Bld: 160 mg/dL — ABNORMAL HIGH (ref 70–99)
Potassium: 3.1 mmol/L — ABNORMAL LOW (ref 3.5–5.1)
Sodium: 136 mmol/L (ref 135–145)
Total Bilirubin: 0.5 mg/dL (ref 0.0–1.2)
Total Protein: 6.4 g/dL — ABNORMAL LOW (ref 6.5–8.1)

## 2023-12-26 LAB — PHOSPHORUS: Phosphorus: 3.7 mg/dL (ref 2.5–4.6)

## 2023-12-26 LAB — MAGNESIUM: Magnesium: 2 mg/dL (ref 1.7–2.4)

## 2023-12-26 LAB — PREALBUMIN: Prealbumin: 11 mg/dL — ABNORMAL LOW (ref 18–38)

## 2023-12-26 LAB — STREP PNEUMONIAE URINARY ANTIGEN: Strep Pneumo Urinary Antigen: NEGATIVE

## 2023-12-26 MED ORDER — METOPROLOL SUCCINATE ER 100 MG PO TB24
100.0000 mg | ORAL_TABLET | Freq: Every day | ORAL | Status: DC
  Administered 2023-12-26 – 2024-01-02 (×8): 100 mg via ORAL
  Filled 2023-12-26 (×9): qty 1

## 2023-12-26 MED ORDER — ARFORMOTEROL TARTRATE 15 MCG/2ML IN NEBU
15.0000 ug | INHALATION_SOLUTION | Freq: Two times a day (BID) | RESPIRATORY_TRACT | Status: DC
  Administered 2023-12-26 – 2024-01-09 (×28): 15 ug via RESPIRATORY_TRACT
  Filled 2023-12-26 (×27): qty 2

## 2023-12-26 MED ORDER — AMLODIPINE BESYLATE 5 MG PO TABS
5.0000 mg | ORAL_TABLET | Freq: Every day | ORAL | Status: DC
  Administered 2023-12-26 – 2024-01-02 (×8): 5 mg via ORAL
  Filled 2023-12-26 (×8): qty 1

## 2023-12-26 MED ORDER — IPRATROPIUM-ALBUTEROL 0.5-2.5 (3) MG/3ML IN SOLN
3.0000 mL | RESPIRATORY_TRACT | Status: DC | PRN
  Administered 2023-12-31: 3 mL via RESPIRATORY_TRACT
  Filled 2023-12-26: qty 3

## 2023-12-26 MED ORDER — REVEFENACIN 175 MCG/3ML IN SOLN
175.0000 ug | Freq: Every day | RESPIRATORY_TRACT | Status: DC
  Administered 2023-12-26 – 2024-01-09 (×15): 175 ug via RESPIRATORY_TRACT
  Filled 2023-12-26 (×15): qty 3

## 2023-12-26 MED ORDER — ALBUTEROL SULFATE (2.5 MG/3ML) 0.083% IN NEBU: 2.5000 mg | INHALATION_SOLUTION | RESPIRATORY_TRACT | Status: DC | PRN

## 2023-12-26 MED ORDER — BUDESONIDE 0.25 MG/2ML IN SUSP
0.2500 mg | Freq: Two times a day (BID) | RESPIRATORY_TRACT | Status: DC
  Administered 2023-12-26 – 2024-01-09 (×28): 0.25 mg via RESPIRATORY_TRACT
  Filled 2023-12-26 (×28): qty 2

## 2023-12-26 MED ORDER — ENOXAPARIN SODIUM 40 MG/0.4ML IJ SOSY
40.0000 mg | PREFILLED_SYRINGE | INTRAMUSCULAR | Status: DC
  Administered 2023-12-26 – 2024-01-08 (×14): 40 mg via SUBCUTANEOUS
  Filled 2023-12-26 (×14): qty 0.4

## 2023-12-26 NOTE — Consult Note (Addendum)
 Palliative Care Consult Note                                  Date: 12/26/2023   Patient Name: LEMMA TETRO  DOB: [birth year]  MRN: [id number]  Age / Sex: 78 y.o., female  PCP: Candise Aleene DEL, MD Referring Physician: Rosario Leatrice FERNS, MD  Reason for Consultation: Establishing goals of care  HPI/Patient Profile: 78 y.o. female  with past medical history of asthma, asbestosis, diastolic CHF, chronic back pain, CAD, and chronic respiratory failure on 3L [place] admitted on 12/25/2023 from independent living with wheezing, SOB, and cough.   Being treated for acute on chronic hypoxemic respiratory failure in setting of asbesosis with pleural plaques along with asthma.  Past Medical History:  Diagnosis Date  . Acute upper GI bleed 06/2014   while pt taking coumadin , plavix , and meloxicam ---despite being told not to take coumadin .  . Anginal pain (HCC)    Nonobstructive CAD 2014; however, her cardiologist put her on a statin for this and NOT for hyperlipidemia per pt report.  Atyp CP 08/2017 at card f/u, plan for myoc perf imaging.  . Anxiety    panic attacks  . BPPV (benign paroxysmal positional vertigo) 12/16/2012  . CAD (coronary artery disease)    Nonobstructive ([facility])  . Chronic diastolic CHF (congestive heart failure) (HCC)    dry wt as of 11/06/16 is 168 lbs.  . Chronic lower back pain   . DDD (degenerative disc disease)    lumbar and cervical.   . Diverticular disease   . Fibromyalgia    Patient states dx was around her late 73s but she had sx's for years prior to this.  . H/O hiatal hernia   . History of pneumonia    hospitalized 12/2011, 02/2013, and 07/2013 [facility]) for this  . HTN (hypertension)    Renal artery dopplers 04/2013 neg for stenosis.  . Hypervitaminosis D 09/27/2019   over-supplemented.  Stopped vit D and plan recheck 2 mo.  . Idiopathic angio-edema-urticaria 27985; 2021   Angioedema component was very  minimal.  2021->Dr. Bobbitt (allergist) eval.  . Insomnia   . Iron  deficiency anemia    Hematologist in [place], [place] did extensive w/u; no cause found; failed oral supplement;; gets fairly regular (q20m or so) IV iron  infusions (Venofer  -iron  sucrose- 200mg  with procrit .  for 14 yr I've been getting blood work q month & getting infusions prn (07/12/2013).  Dr. Timmy locally, iron  infusions done, EPO deficiency dx'd  . Migraine syndrome    not as often anymore; used to be ~ q wk (07/12/2013)  . Mixed incontinence urge and stress   . Nephrolithiasis    passed all on my own or they are still in there (07/12/2013)  . Neuroleptic induced parkinsonism (HCC) 2018   Dr. Evonnie, neuro, saw her 11/24/17 and recommended d/c of abilify  as first step.  D/c'd abilify  and pt got complete recovery.  . Oropharyngeal dysphagia    swallowing study speech path 05/2020. Gastric bx's showed gastritis, h pylori NEG  . OSA on CPAP    prior to move to [place]--had another sleep  study 10/2015 w/pulm Dr. Nathanael.  . Osteoarthritis    severe; progressing fast (07/12/2013); multiple joints-not surgical candidate for TKR (03/2015).  Triamcinolon knee injections by Dr. Arlana 12/2017.  SABRA Pernicious anemia 08/24/2014  . Pleural plaque with presence of asbestos 07/22/2013  . Pulmonary embolism (HCC) 07/2013   Dx at [facility] with very small peripheral upper lobe pe 07/2013: pt took coumadin  for about 8-9 mo  . Pyelonephritis    several times over the last 30 yr (07/12/2013)  . RBBB (right bundle branch block)   . Recurrent major depression (HCC)   . Recurrent UTI    hx of hospitalization for pyelonephritis; started abx prophylaxis 06/2015  . Syncope    Hypotensive; ED visit--Dr. Levern did Cath--nonobstructive CAD, EF 55-60%.  In retrospect, suspect pt rx med misuse/polypharmacy  . Vertebral compression fracture (HCC)    Acute T12 on 02/27/22 (fall).  multiple old thoracic-->neurosurg to do MRI as of 05/2022    Subjective:   I  have reviewed medical records including EPIC notes, labs and imaging, assessed the patient and then met with the patient to discuss diagnosis prognosis, GOC, EOL wishes, disposition and options.   I introduced Palliative Medicine as specialized medical care for people living with serious illness. It focuses on providing relief from symptoms and stress of a serious illness. The goal is to improve quality of life for both the patient and the family. Patient is upset that palliative medicine has been consulted, but is willing to meet.  She did not wish to put any more stress on her son so requested he be contacted to participate.  Today's Discussion: Patient has a good understanding of her chronic progressive conditions and acute hospitalization. She shares that her cough and shortness of breath has worsened since her last hospitalization around six months ago. Although her shortness of breath has worsened she remains independently living at [facility]. She does all her ADLs and IADLs independently. She shares her appetite has worsened due to her breathing status. We discussed her chronic conditions and the natural disease trajectory including risk for further decline and progression.  Patient is widowed. She moved from [facility] about 10 years ago. She was married to her husband 51 years-- he died 9 years ago from mesothelioma. She has two adult children and her son lives in the area. She shares her son has MS and she tries to shield him from unnecessary stress. 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 Her faith is very important to her.  A discussion was had today regarding advanced directives. We discussed concepts specific to code status and scope of care. She confirms her code status is do not resuscitate and that she would not want to be intubated. We discuss that scope of care can be further delineated including wishes with regards to artifical feeding and  hydration, continued IV antibiotics and rehospitalization. She shares she was offered a feeding tube in a previous hospitalization and declined-- She would not want a feeding tube. Her father had a terrible time with the side effects of a feeding tube.   Discussed the importance of continued conversation with family and the medical providers regarding overall plan of care and treatment options, ensuring decisions are within the context of the patient's values and GOCs. Patient shares at the end of our conversation that she is now agreeable to PMT contacting her son.   Questions and concerns were addressed. PMT will continue to support holistically.  1055: Left voicemail for patient's son Dalicia Kisner to see if he would be available to discuss his mother's goals of care. Encouraged to call with questions or concerns.   1700: Met in patient room with patient and son Charlena. We discussed code status, goals of care, and introduced MOST form. Patient will consider completing a MOST form during this hospitalization. Encouraged patient and family to call PMT with questions or concerns. Hard Choices booklet given to Tom.  Review of Systems  Constitutional:  Positive for appetite change (due to shortness of breath) and fatigue.  Respiratory:  Positive for cough and shortness of breath.     Objective:   Primary Diagnoses: Present on Admission: . Asthma exacerbation . HTN (hypertension), benign . Acute on chronic respiratory failure (HCC) . Anxiety . CAD-minor 2014 . Chronic diastolic CHF (congestive heart failure) (HCC) . Chronic pain syndrome   Physical Exam Constitutional:      Interventions: Nasal cannula in place.     Comments: Respiratory distress  HENT:     Head: Normocephalic and atraumatic.  Cardiovascular:     Rate and Rhythm: Normal rate.  Pulmonary:     Effort: Tachypnea present.  Skin:    General: Skin is warm and dry.  Neurological:     Mental Status: She is alert and oriented  to person, place, and time.  Psychiatric:        Mood and Affect: Affect is tearful.     Vital Signs:  BP (!) 142/70   Pulse 79   Temp 98.2 F (36.8 C) (Oral)   Resp 17   Ht 5' 4 (1.626 m)   Wt 70.9 kg   SpO2 100%   BMI 26.83 kg/m    Advanced Care Planning:   Existing Vynca/ACP Documentation: None  Primary Decision Maker: Patient. If patient is unable to make decisions she would like her son Tesa Meadors to assist in decision making.  Code Status/Advance Care Planning: DNR  Assessment & Plan:   SUMMARY OF RECOMMENDATIONS   DNR DNI Treat the treatable Encouraged continued discussion between patient and her family re: goals of care Continued PMT support   Discussed with: bedside RN and Dr. Rosario  Time Total: 90 minutes  Extensive chart review has been completed prior to seeing the patient including labs, vital signs, imaging, progress/consult notes, orders, medications, and available advance directive documents.  Thank you for allowing us  to participate in the care of [name] PMT will continue to support holistically.   Signed by: Stephane Palin, NP Palliative Medicine Team  Team Phone # [phone] (Nights/Weekends)  12/26/2023, 10:32 AM

## 2023-12-26 NOTE — Progress Notes (Signed)
 PROGRESS NOTE    Linda [name]  FMW:[id number] DOB: [birth year] DOA: 12/25/2023 PCP: Candise Aleene DEL, MD  Outpatient Specialists:     Brief Narrative:  Patient is a 78 year old female past medical history significant for asthma, asbestosis, chronic respiratory failure on 3 L of supplemental oxygen  per minute, debility, diastolic CHF, chronic back pain and pain syndrome.  Patient was admitted with acute on chronic respiratory failure/asthma exacerbation.  12/26/2023: Patient seen.  Patient remains in significant respiratory distress.  Assessment & Plan:   Active Problems:   HTN (hypertension), benign   Chronic pain syndrome   Anxiety   CAD-minor 2014   Chronic diastolic CHF (congestive heart failure) (HCC)   Acute on chronic respiratory failure (HCC)   Asthma exacerbation   Present on Admission:  Asthma exacerbation  HTN (hypertension), benign  Acute on chronic respiratory failure (HCC)  Anxiety  CAD-minor 2014  Chronic diastolic CHF (congestive heart failure) (HCC)  Chronic pain syndrome       HTN (hypertension), benign: -Gradually controlled. -Goal blood pressure of less than 130/80 mmHg.   Acute on chronic respiratory failure (HCC) -In the setting of worsening COPD/asthma/pulmonary fibrosis/asbestosis. -Continue IV steroids and nebulizer treatment. -Continue antibiotics. -Pulmonary input is appreciated.   -Palliative care input is appreciated. -Guarded prognosis. -Continue supplemental oxygen . -Supportive care.   -flutter valve    Anxiety Continue home dose of Xanax  but decrease frequency to twice daily as needed   Asthma exacerbation -See above documentation. -Will add Singulair . -Peak flow daily if tolerated.  -Titrate O2 to saturation >90%.  -Influenza PCR neg     Currently mentating well no evidence of symptomatic hypercarbia     CAD-minor 2014 Hold aspirin  given increased wheezing, continue statin Crestor  40 mg/day Continue Imdur  30 mg.   bid   Chronic diastolic CHF (congestive heart failure) (HCC) Continue home dose of Lasix  20 mg a day monitor for any evidence of fluid overload   Chronic pain syndrome Restart home medications   DVT prophylaxis: Will start patient on Lovenox . Code Status: DO NOT RESUSCITATE/DO NOT INTUBATE Family Communication:  Disposition Plan: Home in the next 1 to 2 days.   Consultants:  Palliative care team. Pulmonary/critical care team.  Procedures:  None.  Antimicrobials:  IV Rocephin . IV azithromycin    Subjective: -Shortness of breath -Wheezing. -Remains in respiratory distress.  Objective: Vitals:   12/26/23 1500 12/26/23 1705 12/26/23 1730 12/26/23 1758  BP: (!) 152/127 (!) 128/103 (!) 163/60 (!) 159/70  Pulse: 95 94 94 95  Resp:  (!) 27 18 19   Temp:  98 F (36.7 C)  99.2 F (37.3 C)  TempSrc:  Oral  Oral  SpO2: 100% 97% 96% 95%  Weight:    70 kg  Height:    5' 4 (1.626 m)   No intake or output data in the 24 hours ending 12/26/23 1837 Filed Weights   12/25/23 1444 12/26/23 1758  Weight: 70.9 kg 70 kg    Examination:  General exam: Appears to be in respiratory distress. Respiratory system: Decreased air entry.  Wheezing.   Cardiovascular system: S1 & S2 heard. Gastrointestinal system: Abdomen is soft and nontender.   Central nervous system: Alert and oriented. No focal neurological deficits. Extremities: Symmetric 5 x 5 power. Skin: No rashes, lesions or ulcers Psychiatry: Judgement and insight appear normal. Mood & affect appropriate.     Data Reviewed: I have personally reviewed following labs and imaging studies  CBC: Recent Labs  Lab 12/25/23 1457 12/25/23 1550 12/26/23 0454  WBC 11.1*  --  8.6  HGB 11.7* 11.6* 10.4*  HCT 36.7 34.0* 33.0*  MCV 89.1  --  88.2  PLT 292  --  266   Basic Metabolic Panel: Recent Labs  Lab 12/25/23 1457 12/25/23 1550 12/26/23 0454  NA 136 137 136  K 3.6 3.1* 3.1*  CL 95*  --  101  CO2 29  --  24  GLUCOSE  109*  --  160*  BUN 14  --  13  CREATININE 1.13*  --  1.11*  CALCIUM  8.4*  --  8.4*  MG  --   --  2.0  PHOS  --   --  3.7   GFR: Estimated Creatinine Clearance: 40.7 mL/min (A) (by C-G formula based on SCr of 1.11 mg/dL (H)). Liver Function Tests: Recent Labs  Lab 12/25/23 1457 12/26/23 0454  AST 19 19  ALT 11 11  ALKPHOS 61 50  BILITOT 0.5 0.5  PROT 6.8 6.4*  ALBUMIN 2.8* 2.6*   No results for input(s): LIPASE, AMYLASE in the last 168 hours. No results for input(s): AMMONIA in the last 168 hours. Coagulation Profile: No results for input(s): INR, PROTIME in the last 168 hours. Cardiac Enzymes: No results for input(s): CKTOTAL, CKMB, CKMBINDEX, TROPONINI in the last 168 hours. BNP (last 3 results) Recent Labs    08/19/23 1027  PROBNP 143.0*   HbA1C: No results for input(s): HGBA1C in the last 72 hours. CBG: No results for input(s): GLUCAP in the last 168 hours. Lipid Profile: No results for input(s): CHOL, HDL, LDLCALC, TRIG, CHOLHDL, LDLDIRECT in the last 72 hours. Thyroid  Function Tests: No results for input(s): TSH, T4TOTAL, FREET4, T3FREE, THYROIDAB in the last 72 hours. Anemia Panel: No results for input(s): VITAMINB12, FOLATE, FERRITIN, TIBC, IRON , RETICCTPCT in the last 72 hours. Urine analysis:    Component Value Date/Time   COLORURINE AMBER (A) 02/23/2023 2057   APPEARANCEUR CLOUDY (A) 02/23/2023 2057   LABSPEC 1.012 02/23/2023 2057   PHURINE 5.0 02/23/2023 2057   GLUCOSEU NEGATIVE 02/23/2023 2057   HGBUR NEGATIVE 02/23/2023 2057   BILIRUBINUR NEGATIVE 02/23/2023 2057   BILIRUBINUR 1+ 01/01/2023 1354   KETONESUR NEGATIVE 02/23/2023 2057   PROTEINUR NEGATIVE 02/23/2023 2057   UROBILINOGEN 1.0 01/01/2023 1354   UROBILINOGEN 1.0 10/16/2015 2009   NITRITE POSITIVE (A) 02/23/2023 2057   LEUKOCYTESUR SMALL (A) 02/23/2023 2057   Sepsis Labs: @LABRCNTIP (procalcitonin:4,lacticidven:4)  ) Recent  Results (from the past 240 hours)  Resp panel by RT-PCR (RSV, Flu A&B, Covid) Anterior Nasal Swab     Status: None   Collection Time: 12/25/23  2:57 PM   Specimen: Anterior Nasal Swab  Result Value Ref Range Status   SARS Coronavirus 2 by RT PCR NEGATIVE NEGATIVE Final   Influenza A by PCR NEGATIVE NEGATIVE Final   Influenza B by PCR NEGATIVE NEGATIVE Final    Comment: (NOTE) The Xpert Xpress SARS-CoV-2/FLU/RSV plus assay is intended as an aid in the diagnosis of influenza from Nasopharyngeal swab specimens and should not be used as a sole basis for treatment. Nasal washings and aspirates are unacceptable for Xpert Xpress SARS-CoV-2/FLU/RSV testing.  Fact Sheet for Patients: bloggercourse.com  Fact Sheet for Healthcare Providers: seriousbroker.it  This test is not yet approved or cleared by the [place]  FDA and has been authorized for detection and/or diagnosis of SARS-CoV-2 by FDA under an Emergency Use Authorization (EUA). This EUA will remain in effect (meaning this test can be used) for the duration of the COVID-19 declaration under  Section 564(b)(1) of the Act, 21 U.S.C. section 360bbb-3(b)(1), unless the authorization is terminated or revoked.     Resp Syncytial Virus by PCR NEGATIVE NEGATIVE Final    Comment: (NOTE) Fact Sheet for Patients: bloggercourse.com  Fact Sheet for Healthcare Providers: seriousbroker.it  This test is not yet approved or cleared by the [place]  FDA and has been authorized for detection and/or diagnosis of SARS-CoV-2 by FDA under an Emergency Use Authorization (EUA). This EUA will remain in effect (meaning this test can be used) for the duration of the COVID-19 declaration under Section 564(b)(1) of the Act, 21 U.S.C. section 360bbb-3(b)(1), unless the authorization is terminated or revoked.  Performed at [facility], [address] [place], [place] [address]   Respiratory (~20 pathogens) panel by PCR     Status: None   Collection Time: 12/25/23  8:27 PM   Specimen: Nasopharyngeal Swab; Respiratory  Result Value Ref Range Status   Adenovirus NOT DETECTED NOT DETECTED Final   Coronavirus 229E NOT DETECTED NOT DETECTED Final    Comment: (NOTE) The Coronavirus on the Respiratory Panel, DOES NOT test for the novel  Coronavirus (2019 nCoV)    Coronavirus HKU1 NOT DETECTED NOT DETECTED Final   Coronavirus NL63 NOT DETECTED NOT DETECTED Final   Coronavirus OC43 NOT DETECTED NOT DETECTED Final   Metapneumovirus NOT DETECTED NOT DETECTED Final   Rhinovirus / Enterovirus NOT DETECTED NOT DETECTED Final   Influenza A NOT DETECTED NOT DETECTED Final   Influenza B NOT DETECTED NOT DETECTED Final   Parainfluenza Virus 1 NOT DETECTED NOT DETECTED Final   Parainfluenza Virus 2 NOT DETECTED NOT DETECTED Final   Parainfluenza Virus 3 NOT DETECTED NOT DETECTED Final   Parainfluenza Virus 4 NOT DETECTED NOT DETECTED Final   Respiratory Syncytial Virus NOT DETECTED NOT DETECTED Final   Bordetella pertussis NOT DETECTED NOT DETECTED Final   Bordetella Parapertussis NOT DETECTED NOT DETECTED Final   Chlamydophila pneumoniae NOT DETECTED NOT DETECTED Final   Mycoplasma pneumoniae NOT DETECTED NOT DETECTED Final    Comment: Performed at [facility], [address], [place], [place] [address]  MRSA Next Gen by PCR, Nasal     Status: None   Collection Time: 12/25/23 11:15 PM   Specimen: Nasal Mucosa; Nasal Swab  Result Value Ref Range Status   MRSA by PCR Next Gen NOT DETECTED NOT DETECTED Final    Comment: (NOTE) The GeneXpert MRSA Assay (FDA approved for NASAL specimens only), is one component of a comprehensive MRSA colonization surveillance program. It is not intended to diagnose MRSA infection nor to guide or monitor treatment for MRSA infections. Test performance is not FDA approved in patients less than 23  years old. Performed at [facility], [address] [place], [place] [address]          Radiology Studies: CT Chest Wo Contrast Result Date: 12/25/2023 CLINICAL DATA:  Shortness of breath. EXAM: CT CHEST WITHOUT CONTRAST TECHNIQUE: Multidetector CT imaging of the chest was performed following the standard protocol without IV contrast. RADIATION DOSE REDUCTION: This exam was performed according to the departmental dose-optimization program which includes automated exposure control, adjustment of the mA and/or kV according to patient size and/or use of iterative reconstruction technique. COMPARISON:  Radiograph earlier today.  PET CT 10/08/2023 FINDINGS: Cardiovascular: The heart is upper normal in size. No pericardial effusion. Dense coronary artery calcifications. Moderate aortic atherosclerosis. No aortic aneurysm. Mediastinum/Nodes: Patulous esophagus. Small hiatal hernia. Unchanged 11 mm  anterior paratracheal node series 2, image 22. No axillary adenopathy. Subcentimeter right thyroid  nodule. Not clinically significant; no follow-up imaging recommended (ref: J Am Coll Radiol. 2015 Feb;12(2): 143-50). Lungs/Pleura: Extensive bilateral calcified pleural plaques, without significant change from prior exam. Associated subpleural reticulation and scarring within the lateral left lower hemithorax. There is increasing rounded airspace disease adjacent to a pleural plaque in the medial right lower lobe series 7, image 69. left posterior pleural thickening without significant effusion. No pulmonary edema. Upper Abdomen: No acute findings.  Gallstones. Musculoskeletal: Chronic compression deformities of T1, T7, T9 and T12, unchanged from prior exam no acute osseous findings. IMPRESSION: 1. Extensive bilateral calcified pleural plaques, without significant change from prior exam. Associated subpleural reticulation and scarring within the lateral left lower hemithorax. 2. Increasing rounded airspace  disease adjacent to a pleural plaque in the medial right lower lobe, suspicious for pneumonia. Recommend CT follow-up in 6-8 weeks after course of treatment. 3. Unchanged mildly enlarged anterior paratracheal node. 4. Coronary artery calcifications. 5. Cholelithiasis. Aortic Atherosclerosis (ICD10-I70.0). Electronically Signed   By: Andrea Gasman M.D.   On: 12/25/2023 21:53   DG Chest Port 1 View Result Date: 12/25/2023 CLINICAL DATA:  Shortness of breath. EXAM: PORTABLE CHEST 1 VIEW COMPARISON:  Chest radiographs 08/28/2023 and 07/18/2023; PET-CT 10/08/2023 FINDINGS: Redemonstration of multiple calcified bilateral pleural plaques. Moderately decreased lung volumes, decreased from 08/28/2023. Mild left-greater-than-right lower hemithorax ground-glass opacities are similar to prior radiographs and CT given differences in lung volumes and technique. No pneumothorax. No large pleural effusion is seen however the basilar opacities and pleural plaques limit this evaluation. Mild dextrocurvature of the midthoracic spine with mild-to-moderate multilevel degenerative disc changes. Visualized cardiac silhouette is unremarkable. Moderate to high-grade atherosclerotic calcifications within the aortic arch. IMPRESSION: 1. Redemonstration of multiple calcified bilateral pleural plaques. 2. Moderately decreased lung volumes, decreased from 08/28/2023. 3. Mild left-greater-than-right lower hemithorax ground-glass opacities are similar to prior radiographs and CT given differences in lung volumes and technique. This likely reflects a combination of subsegmental atelectasis, pleural plaques, and scarring. Electronically Signed   By: Tanda Lyons M.D.   On: 12/25/2023 16:56        Scheduled Meds:  acetaminophen   1,000 mg Oral Once   amLODipine   5 mg Oral Daily   arformoterol   15 mcg Nebulization BID   azithromycin   500 mg Oral Daily   budesonide  (PULMICORT ) nebulizer solution  0.25 mg Nebulization BID   DULoxetine    60 mg Oral Daily   furosemide   20 mg Oral Daily   gabapentin   300 mg Oral BID   guaiFENesin   600 mg Oral BID   guaiFENesin   10 mL Oral Q6H   isosorbide  mononitrate  30 mg Oral BID   metoprolol  succinate  100 mg Oral Daily   montelukast   10 mg Oral QHS   pantoprazole   40 mg Oral BID   [START ON 12/27/2023] predniSONE   40 mg Oral Q breakfast   revefenacin   175 mcg Nebulization Daily   sodium chloride  flush  3 mL Intravenous Q12H   Continuous Infusions:  sodium chloride      azithromycin  Stopped (12/25/23 2226)   cefTRIAXone  (ROCEPHIN )  IV Stopped (12/25/23 2124)     LOS: 1 day    Time spent: 35 minutes    Leatrice Chapel, MD  [facility] Pager #: [phone] 7PM-7AM contact night coverage as above

## 2023-12-26 NOTE — Evaluation (Signed)
 Occupational Therapy Evaluation Patient Details Name: Linda [name] MRN: [id number] DOB: 10-01-1946 Today's Date: 12/26/2023   History of Present Illness Pt presents with asthma exacerbation. PMH - chronic bil knee pain due to osteoarthritis, chronic low back pain, anxiety/depression, osteoporosis, chf, copd, htn, fibromyalgia, PE.   Clinical Impression   Pt c/o pain to back, knees, and headache, 6/10, also has SOB/coughing. Pt O2 stayed above 94% during session on 3L O2 via [place]. Pt PLOF mod I, lives in ILF, staff help with cleaning and one meal each day. Pt currently close to or at baseline, good strength/balance with transfer to chair and taking steps around room, mod I for bed mobility and ADLs sitting in chair. Pt uses Rollator at baseline for longer distances. Pt would benefit from [facility] for return home as she has trouble standing from low surfaces and the one she currently has, has a broken arm rest increasing risk for falls. Pt would benefit from acute OT to maximize activity tolerance as able prior to DC home. Pt was receiving HHOT/PT prior to admission, continued HHOT recommended to ensure safety and independence in home setting.        If plan is discharge home, recommend the following: A little help with walking and/or transfers;A little help with bathing/dressing/bathroom;Assistance with cooking/housework;Assist for transportation    Functional Status Assessment  Patient has had a recent decline in their functional status and demonstrates the ability to make significant improvements in function in a reasonable and predictable amount of time.  Equipment Recommendations  BSC/3in1    Recommendations for Other Services       Precautions / Restrictions Precautions Precautions: Fall Precaution Comments: 3L O2 at all times Restrictions Weight Bearing Restrictions Per Provider Order: No      Mobility Bed Mobility Overal bed mobility: Modified Independent                   Transfers Overall transfer level: Needs assistance Equipment used: None Transfers: Sit to/from Stand, Bed to chair/wheelchair/BSC Sit to Stand: Supervision           General transfer comment: supervision for safety      Balance Overall balance assessment: Mild deficits observed, not formally tested                                         ADL either performed or assessed with clinical judgement   ADL Overall ADL's : At baseline;Needs assistance/impaired                                       General ADL Comments: overall mod I, supervision for safety with mobility/transfers due to significant pain to B knees and asthma exacerbation     Vision Baseline Vision/History: 1 Wears glasses Ability to See in Adequate Light: 0 Adequate Patient Visual Report: No change from baseline       Perception         Praxis         Pertinent Vitals/Pain Pain Assessment Pain Assessment: 0-10 Pain Score: 6  Pain Location: back, knees, head Pain Descriptors / Indicators: Aching, Discomfort, Grimacing, Headache Pain Intervention(s): Monitored during session     Extremity/Trunk Assessment Upper Extremity Assessment Upper Extremity Assessment: Overall WFL for tasks assessed  Communication Communication Communication: No apparent difficulties   Cognition Arousal: Alert Behavior During Therapy: WFL for tasks assessed/performed Overall Cognitive Status: Within Functional Limits for tasks assessed                                       General Comments  165/70 lying supine at end of session, O2 stayed above 94% during session with 3L O2    Exercises     Shoulder Instructions      Home Living Family/patient expects to be discharged to:: Private residence Living Arrangements: Alone Available Help at Discharge: Personal care attendant Type of Home: Independent living facility Home Access: Level entry     Home  Layout: One level     Bathroom Shower/Tub: Walk-in shower         Home Equipment: Shower seat;Cane - single point;Wheelchair - [employer] (4 wheels)   Additional Comments: IND living facility, has been getting PT/OT and massage therapy 5X/wk.      Prior Functioning/Environment Prior Level of Function : Independent/Modified Independent             Mobility Comments: uses rollator for distance ADLs Comments: staff helps with cleaning        OT Problem List: Decreased activity tolerance;Pain;Decreased strength      OT Treatment/Interventions: Self-care/ADL training;Therapeutic exercise;Energy conservation;DME and/or AE instruction;Therapeutic activities    OT Goals(Current goals can be found in the care plan section) Acute Rehab OT Goals Patient Stated Goal: to manage breathing OT Goal Formulation: With patient Time For Goal Achievement: 01/09/24 Potential to Achieve Goals: Good  OT Frequency: Min 1X/week    Co-evaluation              AM-PAC OT 6 Clicks Daily Activity     Outcome Measure Help from another person eating meals?: None Help from another person taking care of personal grooming?: A Little Help from another person toileting, which includes using toliet, bedpan, or urinal?: A Little Help from another person bathing (including washing, rinsing, drying)?: A Little Help from another person to put on and taking off regular upper body clothing?: A Little Help from another person to put on and taking off regular lower body clothing?: A Little 6 Click Score: 19   End of Session Equipment Utilized During Treatment: Gait belt Nurse Communication: Mobility status  Activity Tolerance: Patient tolerated treatment well Patient left: in bed;with call bell/phone within reach  OT Visit Diagnosis: Muscle weakness (generalized) (M62.81);Pain Pain - part of body: Knee (back, head)                Time: 8486-8452 OT Time Calculation (min):  34 min Charges:  OT General Charges $OT Visit: 1 Visit OT Evaluation $OT Eval Low Complexity: 1 Low OT Treatments $Self Care/Home Management : 8-22 mins  [place], OTR/L   Linda [name] 12/26/2023, 4:00 PM

## 2023-12-26 NOTE — Consult Note (Signed)
 NAME:  Linda [name], MRN:  [id number], DOB:  1946-05-30, LOS: 1 ADMISSION DATE:  12/25/2023, CONSULTATION DATE:  12/25/23 REFERRING MD:  Dr. Silvester, MD CHIEF COMPLAINT:  asthma exacerbation   History of Present Illness:  Linda [name] is a 78 year old woman with history of restrictive lung disease and chronic hypoxemic respiratory failure in setting of asbesosis with pleural plaques along with asthma who presented to ER with wheezing and dyspnea.   PCCM consulted for asthma exacerbation. She has been given duoneb treatment and steroids. She reports feeling better already but continues to have some work of breathing.   She is baseline on 3L of O2, currently on 4-5L in ER. She denies any sick contacts. Reports some chills denies fevers. She does have cough with sputum production.   She is on 5mg  prednisone  daily.  Pertinent  Medical History   CAD CHF Chronic Back Pain [name] with pleural Plaques Chronic Hypoxemic Respiratory Failure  Significant Hospital Events: Including procedures, antibiotic start and stop dates in addition to other pertinent events   1/2 Admitted to [facility] for asthma exacerbation  Interim History / Subjective:  Mild improvement. On baseline O2 with adequate saturation. Still wheezy.  Objective   Blood pressure (!) 143/86, pulse 96, temperature 98.1 F (36.7 C), temperature source Oral, resp. rate (!) 23, height 5' 4 (1.626 m), weight 70.9 kg, SpO2 100%.        Intake/Output Summary (Last 24 hours) at 12/26/2023 1345 Last data filed at 12/25/2023 1758 Gross per 24 hour  Intake 1000 ml  Output --  Net 1000 ml   Filed Weights   12/25/23 1444  Weight: 70.9 kg    Examination: General: elderly woman, mild respiratory distress HENT: [place]/AT, moist mucous membranes Lungs: diminished air movement, audible upper airway wheeze Cardiovascular: rrr, no murmurs Abdomen: soft, non-tender, non-distended Extremities: warm, no edema Neuro: alert, oriented,  moving all extremities GU: n/a  Resolved Hospital Problem list     Assessment & Plan:  Acute on Chronic Hypoxemic Respiratory Failure Asthma with Acute Exacerbation Restrictive lung disease in setting of asbestosis with pleural plaques CAP RLL rounded opacity Plan: - continue solumedrol 40mg  IV twice daily, plan prednisone  1/4 - duonebs q4hrs PRN, LAMA/LABA/ICS - respiratory viral panel negative - ceftriaxone  + azithromycin  for CAP coverage  Best Practice (right click and Reselect all SmartList Selections daily)   Per primary  Labs   CBC: Recent Labs  Lab 12/25/23 1457 12/25/23 1550 12/26/23 0454  WBC 11.1*  --  8.6  HGB 11.7* 11.6* 10.4*  HCT 36.7 34.0* 33.0*  MCV 89.1  --  88.2  PLT 292  --  266    Basic Metabolic Panel: Recent Labs  Lab 12/25/23 1457 12/25/23 1550 12/26/23 0454  NA 136 137 136  K 3.6 3.1* 3.1*  CL 95*  --  101  CO2 29  --  24  GLUCOSE 109*  --  160*  BUN 14  --  13  CREATININE 1.13*  --  1.11*  CALCIUM  8.4*  --  8.4*  MG  --   --  2.0  PHOS  --   --  3.7   GFR: Estimated Creatinine Clearance: 41 mL/min (A) (by C-G formula based on SCr of 1.11 mg/dL (H)). Recent Labs  Lab 12/25/23 1457 12/26/23 0454  PROCALCITON 0.11  --   WBC 11.1* 8.6    Liver Function Tests: Recent Labs  Lab 12/25/23 1457 12/26/23 0454  AST 19 19  ALT 11  11  ALKPHOS 61 50  BILITOT 0.5 0.5  PROT 6.8 6.4*  ALBUMIN 2.8* 2.6*   No results for input(s): LIPASE, AMYLASE in the last 168 hours. No results for input(s): AMMONIA in the last 168 hours.  ABG    Component Value Date/Time   PHART 7.430 07/20/2013 2000   PCO2ART 41.0 07/20/2013 2000   PO2ART 57.4 (L) 07/20/2013 2000   HCO3 28.0 12/25/2023 1550   TCO2 29 12/25/2023 1550   ACIDBASEDEF 1.1 07/13/2013 1435   O2SAT 96 12/25/2023 1550     Coagulation Profile: No results for input(s): INR, PROTIME in the last 168 hours.  Cardiac Enzymes: No results for input(s): CKTOTAL,  CKMB, CKMBINDEX, TROPONINI in the last 168 hours.  HbA1C: Hgb A1c MFr Bld  Date/Time Value Ref Range Status  02/27/2022 04:42 AM 5.3 4.8 - 5.6 % Final    Comment:    (NOTE) Pre diabetes:          5.7%-6.4%  Diabetes:              >6.4%  Glycemic control for   <7.0% adults with diabetes   01/02/2016 06:10 AM 5.6 4.8 - 5.6 % Final    Comment:    (NOTE)         Pre-diabetes: 5.7 - 6.4         Diabetes: >6.4         Glycemic control for adults with diabetes: <7.0     CBG: No results for input(s): GLUCAP in the last 168 hours.  Review of Systems:   N/a   Past Medical History:  She,  has a past medical history of Acute upper GI bleed (06/2014), Anginal pain (HCC), Anxiety, BPPV (benign paroxysmal positional vertigo) (12/16/2012), CAD (coronary artery disease), Chronic diastolic CHF (congestive heart failure) (HCC), Chronic lower back pain, DDD (degenerative disc disease), Diverticular disease, Fibromyalgia, H/O hiatal hernia, History of pneumonia, HTN (hypertension), Hypervitaminosis D (09/27/2019), Idiopathic angio-edema-urticaria (27985; 2021), Insomnia, Iron  deficiency anemia, Migraine syndrome, Mixed incontinence urge and stress, Nephrolithiasis, Neuroleptic induced parkinsonism (HCC) (2018), Oropharyngeal dysphagia, OSA on CPAP, Osteoarthritis, Pernicious anemia (08/24/2014), Pleural plaque with presence of asbestos (07/22/2013), Pulmonary embolism (HCC) (07/2013), Pyelonephritis, RBBB (right bundle branch block), Recurrent major depression (HCC), Recurrent UTI, Syncope, and Vertebral compression fracture (HCC).   Surgical History:   Past Surgical History:  Procedure Laterality Date   APPENDECTOMY  1960   AXILLARY SURGERY Left 1978   Multiple lump in armpit per pt   BIOPSY  06/17/2020   Gastric bx->gastritis, h pylori neg.  Procedure: BIOPSY;  Surgeon: Rollin Dover, MD;  Location: [facility];  Service: Endoscopy;;   CARDIAC CATHETERIZATION  01/2013    nonobstructive CAD, EF 55-60%   CARDIOVASCULAR STRESS TEST  02/22/2015   Low risk myocard perf imaging; wall motion normal, normal EF   CARDIOVASCULAR STRESS TEST     09/2017 myo perf low risk   carotid duplex doppler  10/21/2017   01/2023 1-30% bilat-->rpt 1 yr   COCCYX REMOVAL  1972   CT CTA CORONARY W/CA SCORE W/CM &/OR WO/CM     04/02/23 96%'tile calcium  score, nonobst CAD-->crestor  increased to 40   DEXA  06/05/2017   T-score -3.1   DILATION AND CURETTAGE OF UTERUS  ? 1970's   ESOPHAGOGASTRODUODENOSCOPY N/A 07/19/2014   Gastritis found + in the setting of supratherapeutic INR, +plavix , + meloxicam .   ESOPHAGOGASTRODUODENOSCOPY (EGD) WITH PROPOFOL  N/A 06/17/2020   NO stricture or other prob to explain pt's dysphagia, dilation was done  anyway.  Gastric bx's-->gastritis, h pylori neg. Procedure: ESOPHAGOGASTRODUODENOSCOPY (EGD) WITH PROPOFOL ;  Surgeon: Rollin Dover, MD;  Location: [facility];  Service: Endoscopy;  Laterality: N/A;   EYE SURGERY Left 2012-2013   injections for ~ 1 yr; don't really know what for (07/12/2013)   HEEL SPUR SURGERY Left 2008   kidney stone removal Right    KNEE SURGERY  2005   LEFT HEART CATHETERIZATION WITH CORONARY ANGIOGRAM N/A 01/30/2013   Procedure: LEFT HEART CATHETERIZATION WITH CORONARY ANGIOGRAM;  Surgeon: Rober LOISE Chroman, MD;  Location: [facility];  Service: Cardiovascular;  Laterality: N/A;   MALONEY DILATION  06/17/2020   Procedure: AGAPITO DILATION;  Surgeon: Rollin Dover, MD;  Location: [facility];  Service: Endoscopy;;   nasolaryngoscopy     06/2023 during hosp for resp failure-->normal   PLANTAR FASCIA RELEASE Left 2008   SPIROMETRY  04/25/2014   In hosp for acute asthma/COPD flare: mixed obstructive and restrictive lung disease. The FEV1 is severely reduced at 45% predicted.  FEV1 signif decreased compared to prior spirometry 07/23/13.   TENDON RELEASE  1996   Right forearm and hand   TOTAL ABDOMINAL HYSTERECTOMY  1974    TRANSTHORACIC ECHOCARDIOGRAM  01/2013; 04/2014;08/2015; 09/2017   2014--NORMAL.  2015--focal basal septal hypertrophy, EF 55-60%, grade I diast dysfxn, mild LAE.  08/2015 EF 55-60%, nl LV syst fxn, grade I DD, valves wnl. 10/21/17: EF 65-70%, grd I DD, o/w normal. 02/17/22 EF 70-75%, hyperdynamic LV fxn, grd I DD.     Social History:   reports that she has never smoked. She has never used smokeless tobacco. She reports that she does not drink alcohol  and does not use drugs.   Family History:  Her family history includes Arthritis in her mother; Asthma in her brother, daughter, and sister; Diabetes in her father and sister; Heart attack in her father and paternal grandmother; Heart disease in her father; Hypertension in her brother, father, and sister; Kidney disease in her mother; Multiple sclerosis in her son; Stroke in her father.   Allergies Allergies  Allergen Reactions   Aripiprazole  Rash and Other (See Comments)    Parkinsonism, also   Penicillins Shortness Of Breath, Itching, Swelling, Rash and Other (See Comments)    Tolerated Cefepime  in ED   Bactrim  [Sulfamethoxazole -Trimethoprim ] Rash     Home Medications  Prior to Admission medications   Medication Sig Start Date End Date Taking? Authorizing Provider  albuterol  (PROVENTIL ) (2.5 MG/3ML) 0.083% nebulizer solution Take 3 mLs (2.5 mg total) by nebulization every 6 (six) hours as needed for wheezing or shortness of breath. 09/23/23   Parrett, Madelin RAMAN, NP  albuterol  (VENTOLIN  HFA) 108 (90 Base) MCG/ACT inhaler Inhale 1-2 puffs into the lungs every 6 (six) hours as needed for wheezing or shortness of breath. 09/23/23   Parrett, Madelin RAMAN, NP  ALPRAZolam  (XANAX ) 0.5 MG tablet TAKE ONE TABLET BY MOUTH THREE TIMES DAILY 09/22/23   McGowen, Aleene DEL, MD  amLODipine  (NORVASC ) 5 MG tablet TAKE ONE TABLET BY MOUTH DAILY 02/24/23   McGowen, Aleene DEL, MD  aspirin  81 MG tablet Take 81 mg by mouth at bedtime.    [provider]  benzonatate   (TESSALON ) 200 MG capsule Take 1 capsule (200 mg total) by mouth 3 (three) times daily as needed. 09/23/23 09/22/24  Parrett, Madelin RAMAN, NP  budesonide  (PULMICORT ) 0.5 MG/2ML nebulizer solution Take 2 mLs (0.5 mg total) by nebulization in the morning and at bedtime. 10/29/23   McGowen, Aleene DEL, MD  buprenorphine  (  BUTRANS ) 10 MCG/HR PTWK Place 1 patch onto the skin once a week. 11/12/23   Babs Arthea DASEN, MD  cyclobenzaprine  (FLEXERIL ) 5 MG tablet Take 5 mg by mouth 2 (two) times daily as needed. 11/02/23   [provider]  diclofenac  Sodium (VOLTAREN ) 1 % GEL Apply 2 g topically 4 (four) times daily. 4 times a day to both knees Patient taking differently: Apply 2 g topically 4 (four) times daily as needed (for pain- to affected areas). 08/07/22   Babs Arthea DASEN, MD  DULoxetine  (CYMBALTA ) 60 MG capsule Take 1 capsule (60 mg total) by mouth daily. 10/29/23   McGowen, Aleene DEL, MD  EPINEPHrine  0.3 mg/0.3 mL IJ SOAJ injection Inject 0.3 mg into the muscle as needed for anaphylaxis. 12/28/20   McGowen, Aleene DEL, MD  fluticasone  (FLONASE ) 50 MCG/ACT nasal spray Place 1 spray into both nostrils at bedtime. 07/21/23   Arlice Reichert, MD  formoterol  (PERFOROMIST ) 20 MCG/2ML nebulizer solution Take 2 mLs (20 mcg total) by nebulization 2 (two) times daily. 10/29/23   McGowen, Aleene DEL, MD  furosemide  (LASIX ) 20 MG tablet Take 1 tablet (20 mg total) by mouth daily. 07/22/23   Arlice Reichert, MD  gabapentin  (NEURONTIN ) 300 MG capsule Take 1 capsule (300 mg total) by mouth 2 (two) times daily. 10/29/23   McGowen, Aleene DEL, MD  ipratropium (ATROVENT ) 0.03 % nasal spray Place 2 sprays into both nostrils every 12 (twelve) hours as needed (allergies). 01/01/23   McGowen, Aleene DEL, MD  isosorbide  mononitrate (IMDUR ) 30 MG 24 hr tablet TAKE TWO TABLETS BY MOUTH IN THE MORNING AND TAKE TWO TABLETS AT BEDTIME 12/16/23   Pietro Redell RAMAN, MD  lamoTRIgine  (LAMICTAL ) 200 MG tablet TAKE ONE TABLET BY MOUTH DAILY 08/12/23    McGowen, Aleene DEL, MD  loratadine  (CLARITIN ) 10 MG tablet Take 1 tablet (10 mg total) by mouth daily as needed for allergies or rhinitis. 09/03/23 12/02/23  McGowen, Aleene DEL, MD  methocarbamol  (ROBAXIN ) 500 MG tablet Take 1 tablet (500 mg total) by mouth every 8 (eight) hours as needed for muscle spasms. 07/02/23   Babs Arthea DASEN, MD  metoprolol  succinate (TOPROL -XL) 100 MG 24 hr tablet Take 0.5-1 tablets (50-100 mg total) by mouth See admin instructions. Take 100 mg by mouth once a day and an additional 50 mg as needed for a B/P greater than 160/90 11/13/23   McGowen, Aleene DEL, MD  montelukast  (SINGULAIR ) 10 MG tablet Take 1 tablet (10 mg total) by mouth at bedtime. 09/03/23 10/03/23  McGowen, Aleene DEL, MD  Multiple Vitamin (MULTIVITAMIN WITH MINERALS) TABS tablet Take 1 tablet by mouth daily with breakfast.    [provider]  nitroGLYCERIN  (NITROSTAT ) 0.4 MG SL tablet Place 1 tablet (0.4 mg total) under the tongue every 5 (five) minutes as needed for chest pain (x 3 doses). 01/08/23   Pietro Redell RAMAN, MD  oxyCODONE -acetaminophen  (PERCOCET) 10-325 MG tablet Take 1 tablet by mouth every 6 (six) hours as needed for pain. 12/11/23   Debby Fidela CROME, NP  OXYGEN  Inhale 3 L/min into the lungs continuous.    [provider]  pantoprazole  (PROTONIX ) 40 MG tablet TAKE ONE TABLET BY MOUTH TWICE DAILY 10/20/23   McGowen, Aleene DEL, MD  predniSONE  (DELTASONE ) 5 MG tablet 2 tabs po every day x 10d then 1 tab po every day x 10d 10/29/23   McGowen, Aleene DEL, MD  rOPINIRole  (REQUIP ) 0.5 MG tablet TAKE ONE TABLET BY MOUTH AT BEDTIME Patient taking differently: Take 0.5  mg by mouth at bedtime as needed. TAKE ONE TABLET BY MOUTH AT BEDTIME 07/17/23   McGowen, Aleene DEL, MD  rosuvastatin  (CRESTOR ) 40 MG tablet Take 1 tablet (40 mg total) by mouth every evening. 04/02/23   Pietro Redell RAMAN, MD  solifenacin  (VESICARE ) 10 MG tablet Take 1 tablet (10 mg total) by mouth daily. 08/05/23   McGowen, Aleene DEL, MD   Spacer/Aero-Holding Raguel DEVI 1 puff by Does not apply route 2 (two) times daily. 09/01/23   Eretria Manternach, Donnice SAUNDERS, MD  tezepelumab -ekko (TEZSPIRE ) 210 MG/1. syringe Inject 210 mg into the skin every 28 (twenty-eight) days.    [provider]  traZODone  (DESYREL ) 50 MG tablet TAKE 2-4 TABLETS BY MOUTH AT BEDTIME AS NEEDED INSOMNIA 11/17/23   McGowen, Aleene DEL, MD     Critical care time: n/a    Donnice SAUNDERS Beals, MD [facility] Office: [phone]   See Amion for personal pager PCCM on call pager [phone] until 7pm. Please call Elink 7p-7a. [phone]

## 2023-12-26 NOTE — Progress Notes (Signed)
 PT Cancellation Note  Patient Details Name: Linda [name] MRN: [id number] DOB: 03-28-1946   Cancelled Treatment:    Reason Eval/Treat Not Completed: (P) Other (comment). Pt currently speaking with NP. NP requesting PT return later. Will plan to follow-up tomorrow as able.   Theo Ferretti, PT, DPT Acute Rehabilitation Services  Office: [phone]    Theo CHRISTELLA Ferretti 12/26/2023, 5:12 PM

## 2023-12-26 NOTE — ED Notes (Signed)
 Patient currently with occupational therapist at bedside.

## 2023-12-26 NOTE — ED Notes (Signed)
 ED TO INPATIENT HANDOFF REPORT  ED Nurse Name and Phone #: Mardeen RN [phone]  S Name/Age/Gender Linda [name] 78 y.o. female Room/Bed: 043C/043C  Code Status   Code Status: Limited: Do not attempt resuscitation (DNR) -DNR-LIMITED -Do Not Intubate/DNI   Home/SNF/Other Nursing Home Patient oriented to: self, place, time, and situation Is this baseline? Yes   Triage Complete: Triage complete  Chief Complaint Asthma exacerbation [J45.901]  Triage Note Pt arrived via [facility] from [facility] for SOB and coughx80mos. Pt told EMS productive cough w/green mucousx2-3wks. Pt c/o chills and bodyaches. Pt wears 3L 02 per [place] at baseline. EMS gave albuterol  nebx2 total of 10 mg. Per EMS, pt c/o chest tightness w/cough   Allergies Allergies  Allergen Reactions   Aripiprazole  Rash and Other (See Comments)    Parkinsonism, also   Penicillins Shortness Of Breath, Itching, Swelling, Rash and Other (See Comments)    Tolerated Cefepime  in ED   Bactrim  [Sulfamethoxazole -Trimethoprim ] Rash    Level of Care/Admitting Diagnosis ED Disposition     ED Disposition  Admit   Condition  --   Comment  Hospital Area: [facility] [[id number]]  Level of Care: Progressive [102]  Admit to Progressive based on following criteria: MULTISYSTEM THREATS such as stable sepsis, metabolic/electrolyte imbalance with or without encephalopathy that is responding to early treatment.  Admit to Progressive based on following criteria: RESPIRATORY PROBLEMS hypoxemic/hypercapnic respiratory failure that is responsive to NIPPV (BiPAP) or High Flow Nasal Cannula (6-80 lpm). Frequent assessment/intervention, no > Q2 hrs < Q4 hrs, to maintain oxygenation and pulmonary hygiene.  May admit patient to [facility] or [facility] if equivalent level of care is available:: No  Covid Evaluation: Asymptomatic - no recent exposure (last 10 days) testing not required  Diagnosis: Asthma  exacerbation [302487]  Admitting Physician: DOUTOVA, ANASTASSIA [[id number]]  Attending Physician: DOUTOVA, ANASTASSIA [[id number]]  Certification:: I certify this patient will need inpatient services for at least 2 midnights  Expected Medical Readiness: 12/28/2023          B Medical/Surgery History Past Medical History:  Diagnosis Date   Acute upper GI bleed 06/2014   while pt taking coumadin , plavix , and meloxicam ---despite being told not to take coumadin .   Anginal pain (HCC)    Nonobstructive CAD 2014; however, her cardiologist put her on a statin for this and NOT for hyperlipidemia per pt report.  Atyp CP 08/2017 at card f/u, plan for myoc perf imaging.   Anxiety    panic attacks   BPPV (benign paroxysmal positional vertigo) 12/16/2012   CAD (coronary artery disease)    Nonobstructive ([place] [facility])   Chronic diastolic CHF (congestive heart failure) (HCC)    dry wt as of 11/06/16 is 168 lbs.   Chronic lower back pain    DDD (degenerative disc disease)    lumbar and cervical.    Diverticular disease    Fibromyalgia    Patient states dx was around her late 21s but she had sx's for years prior to this.   H/O hiatal hernia    History of pneumonia    hospitalized 12/2011, 02/2013, and 07/2013 [facility]) for this   HTN (hypertension)    Renal artery dopplers 04/2013 neg for stenosis.   Hypervitaminosis D 09/27/2019   over-supplemented.  Stopped vit D and plan recheck 2 mo.   Idiopathic angio-edema-urticaria 27985; 2021   Angioedema component was very minimal.  2021->Dr. Bobbitt (allergist) eval.   Insomnia    Iron  deficiency anemia  Hematologist in [place], [place] did extensive w/u; no cause found; failed oral supplement;; gets fairly regular (q53m or so) IV iron  infusions (Venofer  -iron  sucrose- 200mg  with procrit .  for 14 yr I've been getting blood work q month & getting infusions prn (07/12/2013).  Dr. Timmy locally, iron  infusions done, EPO deficiency dx'd   Migraine syndrome    not as  often anymore; used to be ~ q wk (07/12/2013)   Mixed incontinence urge and stress    Nephrolithiasis    passed all on my own or they are still in there (07/12/2013)   Neuroleptic induced parkinsonism (HCC) 2018   Dr. Evonnie, neuro, saw her 11/24/17 and recommended d/c of abilify  as first step.  D/c'd abilify  and pt got complete recovery.   Oropharyngeal dysphagia    swallowing study speech path 05/2020. Gastric bx's showed gastritis, h pylori NEG   OSA on CPAP    prior to move to [place]--had another sleep study 10/2015 w/pulm Dr. Nathanael.   Osteoarthritis    severe; progressing fast (07/12/2013); multiple joints-not surgical candidate for TKR (03/2015).  Triamcinolon knee injections by Dr. Arlana 12/2017.   Pernicious anemia 08/24/2014   Pleural plaque with presence of asbestos 07/22/2013   Pulmonary embolism (HCC) 07/2013   Dx at [facility] with very small peripheral upper lobe pe 07/2013: pt took coumadin  for about 8-9 mo   Pyelonephritis    several times over the last 30 yr (07/12/2013)   RBBB (right bundle branch block)    Recurrent major depression (HCC)    Recurrent UTI    hx of hospitalization for pyelonephritis; started abx prophylaxis 06/2015   Syncope    Hypotensive; ED visit--Dr. Levern did Cath--nonobstructive CAD, EF 55-60%.  In retrospect, suspect pt rx med misuse/polypharmacy   Vertebral compression fracture (HCC)    Acute T12 on 02/27/22 (fall).  multiple old thoracic-->neurosurg to do MRI as of 05/2022   Past Surgical History:  Procedure Laterality Date   APPENDECTOMY  1960   AXILLARY SURGERY Left 1978   Multiple lump in armpit per pt   BIOPSY  06/17/2020   Gastric bx->gastritis, h pylori neg.  Procedure: BIOPSY;  Surgeon: Rollin Dover, MD;  Location: [facility];  Service: Endoscopy;;   CARDIAC CATHETERIZATION  01/2013   nonobstructive CAD, EF 55-60%   CARDIOVASCULAR STRESS TEST  02/22/2015   Low risk myocard perf imaging; wall motion normal, normal EF   CARDIOVASCULAR STRESS  TEST     09/2017 myo perf low risk   carotid duplex doppler  10/21/2017   01/2023 1-30% bilat-->rpt 1 yr   COCCYX REMOVAL  1972   CT CTA CORONARY W/CA SCORE W/CM &/OR WO/CM     04/02/23 96%'tile calcium  score, nonobst CAD-->crestor  increased to 40   DEXA  06/05/2017   T-score -3.1   DILATION AND CURETTAGE OF UTERUS  ? 1970's   ESOPHAGOGASTRODUODENOSCOPY N/A 07/19/2014   Gastritis found + in the setting of supratherapeutic INR, +plavix , + meloxicam .   ESOPHAGOGASTRODUODENOSCOPY (EGD) WITH PROPOFOL  N/A 06/17/2020   NO stricture or other prob to explain pt's dysphagia, dilation was done anyway.  Gastric bx's-->gastritis, h pylori neg. Procedure: ESOPHAGOGASTRODUODENOSCOPY (EGD) WITH PROPOFOL ;  Surgeon: Rollin Dover, MD;  Location: [facility];  Service: Endoscopy;  Laterality: N/A;   EYE SURGERY Left 2012-2013   injections for ~ 1 yr; don't really know what for (07/12/2013)   HEEL SPUR SURGERY Left 2008   kidney stone removal Right    KNEE SURGERY  2005   LEFT HEART  CATHETERIZATION WITH CORONARY ANGIOGRAM N/A 01/30/2013   Procedure: LEFT HEART CATHETERIZATION WITH CORONARY ANGIOGRAM;  Surgeon: Rober LOISE Chroman, MD;  Location: [facility] CATH LAB;  Service: Cardiovascular;  Laterality: N/A;   MALONEY DILATION  06/17/2020   Procedure: AGAPITO DILATION;  Surgeon: Rollin Dover, MD;  Location: [facility];  Service: Endoscopy;;   nasolaryngoscopy     06/2023 during hosp for resp failure-->normal   PLANTAR FASCIA RELEASE Left 2008   SPIROMETRY  04/25/2014   In hosp for acute asthma/COPD flare: mixed obstructive and restrictive lung disease. The FEV1 is severely reduced at 45% predicted.  FEV1 signif decreased compared to prior spirometry 07/23/13.   TENDON RELEASE  1996   Right forearm and hand   TOTAL ABDOMINAL HYSTERECTOMY  1974   TRANSTHORACIC ECHOCARDIOGRAM  01/2013; 04/2014;08/2015; 09/2017   2014--NORMAL.  2015--focal basal septal hypertrophy, EF 55-60%, grade I diast dysfxn, mild LAE.  08/2015 EF  55-60%, nl LV syst fxn, grade I DD, valves wnl. 10/21/17: EF 65-70%, grd I DD, o/w normal. 02/17/22 EF 70-75%, hyperdynamic LV fxn, grd I DD.     A IV Location/Drains/Wounds Patient Lines/Drains/Airways Status     Active Line/Drains/Airways     Name Placement date Placement time Site Days   Peripheral IV 12/25/23 20 G Left Antecubital 12/25/23  1458  Antecubital  1   External Urinary Catheter 12/26/23  0549  --  less than 1            Intake/Output Last 24 hours  Intake/Output Summary (Last 24 hours) at 12/26/2023 1716 Last data filed at 12/25/2023 1758 Gross per 24 hour  Intake 1000 ml  Output --  Net 1000 ml    Labs/Imaging Results for orders placed or performed during the hospital encounter of 12/25/23 (from the past 48 hours)  Resp panel by RT-PCR (RSV, Flu A&B, Covid) Anterior Nasal Swab     Status: None   Collection Time: 12/25/23  2:57 PM   Specimen: Anterior Nasal Swab  Result Value Ref Range   SARS Coronavirus 2 by RT PCR NEGATIVE NEGATIVE   Influenza A by PCR NEGATIVE NEGATIVE   Influenza B by PCR NEGATIVE NEGATIVE    Comment: (NOTE) The Xpert Xpress SARS-CoV-2/FLU/RSV plus assay is intended as an aid in the diagnosis of influenza from Nasopharyngeal swab specimens and should not be used as a sole basis for treatment. Nasal washings and aspirates are unacceptable for Xpert Xpress SARS-CoV-2/FLU/RSV testing.  Fact Sheet for Patients: bloggercourse.com  Fact Sheet for Healthcare Providers: seriousbroker.it  This test is not yet approved or cleared by the [place]  FDA and has been authorized for detection and/or diagnosis of SARS-CoV-2 by FDA under an Emergency Use Authorization (EUA). This EUA will remain in effect (meaning this test can be used) for the duration of the COVID-19 declaration under Section 564(b)(1) of the Act, 21 U.S.C. section 360bbb-3(b)(1), unless the authorization is terminated  or revoked.     Resp Syncytial Virus by PCR NEGATIVE NEGATIVE    Comment: (NOTE) Fact Sheet for Patients: bloggercourse.com  Fact Sheet for Healthcare Providers: seriousbroker.it  This test is not yet approved or cleared by the [place]  FDA and has been authorized for detection and/or diagnosis of SARS-CoV-2 by FDA under an Emergency Use Authorization (EUA). This EUA will remain in effect (meaning this test can be used) for the duration of the COVID-19 declaration under Section 564(b)(1) of the Act, 21 U.S.C. section 360bbb-3(b)(1), unless the authorization is terminated or revoked.  Performed at [facility], [address] [place], [place] [address]   Comprehensive metabolic panel     Status: Abnormal   Collection Time: 12/25/23  2:57 PM  Result Value Ref Range   Sodium 136 135 - 145 mmol/L   Potassium 3.6 3.5 - 5.1 mmol/L   Chloride 95 (L) 98 - 111 mmol/L   CO2 29 22 - 32 mmol/L   Glucose, Bld 109 (H) 70 - 99 mg/dL    Comment: Glucose reference range applies only to samples taken after fasting for at least 8 hours.   BUN 14 8 - 23 mg/dL   Creatinine, Ser 8.86 (H) 0.44 - 1.00 mg/dL   Calcium  8.4 (L) 8.9 - 10.3 mg/dL   Total Protein 6.8 6.5 - 8.1 g/dL   Albumin 2.8 (L) 3.5 - 5.0 g/dL   AST 19 15 - 41 U/L   ALT 11 0 - 44 U/L   Alkaline Phosphatase 61 38 - 126 U/L   Total Bilirubin 0.5 0.0 - 1.2 mg/dL   GFR, Estimated 50 (L) >60 mL/min    Comment: (NOTE) Calculated using the CKD-EPI Creatinine Equation (2021)    Anion gap 12 5 - 15    Comment: Performed at [facility], [address] [place], [place] [address]  CBC     Status: Abnormal   Collection Time: 12/25/23  2:57 PM  Result Value Ref Range   WBC 11.1 (H) 4.0 - 10.5 K/uL   RBC 4.12 3.87 - 5.11 MIL/uL   Hemoglobin 11.7 (L) 12.0 - 15.0 g/dL   HCT 63.2 63.9 - 53.9 %   MCV 89.1 80.0 - 100.0 fL   MCH 28.4 26.0 - 34.0 pg   MCHC 31.9 30.0 - 36.0  g/dL   RDW 87.2 88.4 - 84.4 %   Platelets 292 150 - 400 K/uL   nRBC 0.0 0.0 - 0.2 %    Comment: Performed at [facility], [address] [place], [place] [address]  Procalcitonin     Status: None   Collection Time: 12/25/23  2:57 PM  Result Value Ref Range   Procalcitonin 0.11 ng/mL    Comment:        Interpretation: PCT (Procalcitonin) <= 0.5 ng/mL: Systemic infection (sepsis) is not likely. Local bacterial infection is possible. (NOTE)       Sepsis PCT Algorithm           Lower Respiratory Tract                                      Infection PCT Algorithm    ----------------------------     ----------------------------         PCT < 0.25 ng/mL                PCT < 0.10 ng/mL          Strongly encourage             Strongly discourage   discontinuation of antibiotics    initiation of antibiotics    ----------------------------     -----------------------------       PCT 0.25 - 0.50 ng/mL            PCT 0.10 - 0.25 ng/mL               OR       >80% decrease in PCT  Discourage initiation of                                            antibiotics      Encourage discontinuation           of antibiotics    ----------------------------     -----------------------------         PCT >= 0.50 ng/mL              PCT 0.26 - 0.50 ng/mL               AND        <80% decrease in PCT             Encourage initiation of                                             antibiotics       Encourage continuation           of antibiotics    ----------------------------     -----------------------------        PCT >= 0.50 ng/mL                  PCT > 0.50 ng/mL               AND         increase in PCT                  Strongly encourage                                      initiation of antibiotics    Strongly encourage escalation           of antibiotics                                     -----------------------------                                           PCT <= 0.25 ng/mL                                                  OR                                        > 80% decrease in PCT                                      Discontinue / Do not initiate  antibiotics  Performed at [facility], [address], [place], [place] [address]   I-Stat venous blood gas, ED     Status: Abnormal   Collection Time: 12/25/23  3:50 PM  Result Value Ref Range   pH, Ven 7.461 (H) 7.25 - 7.43   pCO2, Ven 39.3 (L) 44 - 60 mmHg   pO2, Ven 75 (H) 32 - 45 mmHg   Bicarbonate 28.0 20.0 - 28.0 mmol/L   TCO2 29 22 - 32 mmol/L   O2 Saturation 96 %   Acid-Base Excess 4.0 (H) 0.0 - 2.0 mmol/L   Sodium 137 135 - 145 mmol/L   Potassium 3.1 (L) 3.5 - 5.1 mmol/L   Calcium , Ion 1.03 (L) 1.15 - 1.40 mmol/L   HCT 34.0 (L) 36.0 - 46.0 %   Hemoglobin 11.6 (L) 12.0 - 15.0 g/dL   Sample type VENOUS   Respiratory (~20 pathogens) panel by PCR     Status: None   Collection Time: 12/25/23  8:27 PM   Specimen: Nasopharyngeal Swab; Respiratory  Result Value Ref Range   Adenovirus NOT DETECTED NOT DETECTED   Coronavirus 229E NOT DETECTED NOT DETECTED    Comment: (NOTE) The Coronavirus on the Respiratory Panel, DOES NOT test for the novel  Coronavirus (2019 nCoV)    Coronavirus HKU1 NOT DETECTED NOT DETECTED   Coronavirus NL63 NOT DETECTED NOT DETECTED   Coronavirus OC43 NOT DETECTED NOT DETECTED   Metapneumovirus NOT DETECTED NOT DETECTED   Rhinovirus / Enterovirus NOT DETECTED NOT DETECTED   Influenza A NOT DETECTED NOT DETECTED   Influenza B NOT DETECTED NOT DETECTED   Parainfluenza Virus 1 NOT DETECTED NOT DETECTED   Parainfluenza Virus 2 NOT DETECTED NOT DETECTED   Parainfluenza Virus 3 NOT DETECTED NOT DETECTED   Parainfluenza Virus 4 NOT DETECTED NOT DETECTED   Respiratory Syncytial Virus NOT DETECTED NOT DETECTED   Bordetella pertussis NOT DETECTED NOT DETECTED   Bordetella Parapertussis NOT DETECTED NOT DETECTED   Chlamydophila  pneumoniae NOT DETECTED NOT DETECTED   Mycoplasma pneumoniae NOT DETECTED NOT DETECTED    Comment: Performed at [facility], [address] [place], [place] [address]  MRSA Next Gen by PCR, Nasal     Status: None   Collection Time: 12/25/23 11:15 PM   Specimen: Nasal Mucosa; Nasal Swab  Result Value Ref Range   MRSA by PCR Next Gen NOT DETECTED NOT DETECTED    Comment: (NOTE) The GeneXpert MRSA Assay (FDA approved for NASAL specimens only), is one component of a comprehensive MRSA colonization surveillance program. It is not intended to diagnose MRSA infection nor to guide or monitor treatment for MRSA infections. Test performance is not FDA approved in patients less than 13 years old. Performed at [facility], [address], [place], [place] [address]   Prealbumin     Status: Abnormal   Collection Time: 12/26/23  4:54 AM  Result Value Ref Range   Prealbumin 11 (L) 18 - 38 mg/dL    Comment: Performed at [facility], [address], [place], [place] [address]  Magnesium      Status: None   Collection Time: 12/26/23  4:54 AM  Result Value Ref Range   Magnesium  2.0 1.7 - 2.4 mg/dL    Comment: Performed at [facility], [address], [place], [place] [address]  Phosphorus     Status: None   Collection Time: 12/26/23  4:54 AM  Result Value Ref Range  Phosphorus 3.7 2.5 - 4.6 mg/dL    Comment: Performed at [facility], [address] [place], [place] [address]  Comprehensive metabolic panel     Status: Abnormal   Collection Time: 12/26/23  4:54 AM  Result Value Ref Range   Sodium 136 135 - 145 mmol/L   Potassium 3.1 (L) 3.5 - 5.1 mmol/L   Chloride 101 98 - 111 mmol/L   CO2 24 22 - 32 mmol/L   Glucose, Bld 160 (H) 70 - 99 mg/dL    Comment: Glucose reference range applies only to samples taken after fasting for at least 8 hours.   BUN 13 8 - 23 mg/dL   Creatinine, Ser 8.88 (H) 0.44 - 1.00 mg/dL   Calcium  8.4 (L) 8.9 - 10.3 mg/dL   Total  Protein 6.4 (L) 6.5 - 8.1 g/dL   Albumin 2.6 (L) 3.5 - 5.0 g/dL   AST 19 15 - 41 U/L   ALT 11 0 - 44 U/L   Alkaline Phosphatase 50 38 - 126 U/L   Total Bilirubin 0.5 0.0 - 1.2 mg/dL   GFR, Estimated 51 (L) >60 mL/min    Comment: (NOTE) Calculated using the CKD-EPI Creatinine Equation (2021)    Anion gap 11 5 - 15    Comment: Performed at [facility], [address], [place], [place] [address]  CBC     Status: Abnormal   Collection Time: 12/26/23  4:54 AM  Result Value Ref Range   WBC 8.6 4.0 - 10.5 K/uL   RBC 3.74 (L) 3.87 - 5.11 MIL/uL   Hemoglobin 10.4 (L) 12.0 - 15.0 g/dL   HCT 66.9 (L) 63.9 - 53.9 %   MCV 88.2 80.0 - 100.0 fL   MCH 27.8 26.0 - 34.0 pg   MCHC 31.5 30.0 - 36.0 g/dL   RDW 87.2 88.4 - 84.4 %   Platelets 266 150 - 400 K/uL   nRBC 0.0 0.0 - 0.2 %    Comment: Performed at [facility], [address] [place], [place] [address]  Strep pneumoniae urinary antigen     Status: None   Collection Time: 12/26/23  7:55 AM  Result Value Ref Range   Strep Pneumo Urinary Antigen NEGATIVE NEGATIVE    Comment:        Infection due to S. pneumoniae cannot be absolutely ruled out since the antigen present may be below the detection limit of the test. Performed at [facility], [address], [place], [place] [address]    *Note: Due to a large number of results and/or encounters for the requested time period, some results have not been displayed. A complete set of results can be found in Results Review.   CT Chest Wo Contrast Result Date: 12/25/2023 CLINICAL DATA:  Shortness of breath. EXAM: CT CHEST WITHOUT CONTRAST TECHNIQUE: Multidetector CT imaging of the chest was performed following the standard protocol without IV contrast. RADIATION DOSE REDUCTION: This exam was performed according to the departmental dose-optimization program which includes automated exposure control, adjustment of the mA and/or kV according to patient size and/or use of iterative  reconstruction technique. COMPARISON:  Radiograph earlier today.  PET CT 10/08/2023 FINDINGS: Cardiovascular: The heart is upper normal in size. No pericardial effusion. Dense coronary artery calcifications. Moderate aortic atherosclerosis. No aortic aneurysm. Mediastinum/Nodes: Patulous esophagus. Small hiatal hernia. Unchanged 11 mm anterior paratracheal node series 2, image 22. No axillary adenopathy. Subcentimeter right thyroid  nodule. Not clinically significant; no follow-up  imaging recommended (ref: J Am Coll Radiol. 2015 Feb;12(2): 143-50). Lungs/Pleura: Extensive bilateral calcified pleural plaques, without significant change from prior exam. Associated subpleural reticulation and scarring within the lateral left lower hemithorax. There is increasing rounded airspace disease adjacent to a pleural plaque in the medial right lower lobe series 7, image 69. left posterior pleural thickening without significant effusion. No pulmonary edema. Upper Abdomen: No acute findings.  Gallstones. Musculoskeletal: Chronic compression deformities of T1, T7, T9 and T12, unchanged from prior exam no acute osseous findings. IMPRESSION: 1. Extensive bilateral calcified pleural plaques, without significant change from prior exam. Associated subpleural reticulation and scarring within the lateral left lower hemithorax. 2. Increasing rounded airspace disease adjacent to a pleural plaque in the medial right lower lobe, suspicious for pneumonia. Recommend CT follow-up in 6-8 weeks after course of treatment. 3. Unchanged mildly enlarged anterior paratracheal node. 4. Coronary artery calcifications. 5. Cholelithiasis. Aortic Atherosclerosis (ICD10-I70.0). Electronically Signed   By: Andrea Gasman M.D.   On: 12/25/2023 21:53   DG Chest Port 1 View Result Date: 12/25/2023 CLINICAL DATA:  Shortness of breath. EXAM: PORTABLE CHEST 1 VIEW COMPARISON:  Chest radiographs 08/28/2023 and 07/18/2023; PET-CT 10/08/2023 FINDINGS:  Redemonstration of multiple calcified bilateral pleural plaques. Moderately decreased lung volumes, decreased from 08/28/2023. Mild left-greater-than-right lower hemithorax ground-glass opacities are similar to prior radiographs and CT given differences in lung volumes and technique. No pneumothorax. No large pleural effusion is seen however the basilar opacities and pleural plaques limit this evaluation. Mild dextrocurvature of the midthoracic spine with mild-to-moderate multilevel degenerative disc changes. Visualized cardiac silhouette is unremarkable. Moderate to high-grade atherosclerotic calcifications within the aortic arch. IMPRESSION: 1. Redemonstration of multiple calcified bilateral pleural plaques. 2. Moderately decreased lung volumes, decreased from 08/28/2023. 3. Mild left-greater-than-right lower hemithorax ground-glass opacities are similar to prior radiographs and CT given differences in lung volumes and technique. This likely reflects a combination of subsegmental atelectasis, pleural plaques, and scarring. Electronically Signed   By: Tanda Lyons M.D.   On: 12/25/2023 16:56    Pending Labs Unresulted Labs (From admission, onward)     Start     Ordered   12/25/23 2315  Expectorated Sputum Assessment w Gram Stain, Rflx to Resp Cult  (COPD / Pneumonia / Cellulitis / Lower Extremity Wound)  Once,   R        12/25/23 2315   12/25/23 2027  Legionella Pneumophila Serogp 1 Ur Ag  Once,   R        12/25/23 2026   12/25/23 1901  CBC with Differential/Platelet  Add-on,   AD       Question:  Release to patient  Answer:  Immediate   12/25/23 1900   12/25/23 1901  CK  Add-on,   AD        12/25/23 1900   12/25/23 1901  Magnesium   Add-on,   AD        12/25/23 1900   12/25/23 1901  Phosphorus  Add-on,   AD        12/25/23 1900   12/25/23 1901  TSH  Add-on,   AD        12/25/23 1900            Vitals/Pain Today's Vitals   12/26/23 1400 12/26/23 1500 12/26/23 1624 12/26/23 1705  BP:  (!) 175/68 (!) 152/127  (!) 128/103  Pulse: 98 95  94  Resp: 17   (!) 27  Temp:    98 F (36.7 C)  TempSrc:  Oral  SpO2: 100% 100%  97%  Weight:      Height:      PainSc:   8      Isolation Precautions No active isolations  Medications Medications  acetaminophen  (TYLENOL ) tablet 1,000 mg (1,000 mg Oral Patient Refused/Not Given 12/25/23 1834)  ALPRAZolam  (XANAX ) tablet 0.5 mg (0.5 mg Oral Given 12/26/23 1631)  DULoxetine  (CYMBALTA ) DR capsule 60 mg (60 mg Oral Given 12/26/23 1045)  furosemide  (LASIX ) tablet 20 mg (20 mg Oral Given 12/26/23 1046)  gabapentin  (NEURONTIN ) capsule 300 mg (300 mg Oral Given 12/26/23 1045)  isosorbide  mononitrate (IMDUR ) 24 hr tablet 30 mg (30 mg Oral Given 12/26/23 1046)  loratadine  (CLARITIN ) tablet 10 mg (has no administration in time range)  montelukast  (SINGULAIR ) tablet 10 mg (10 mg Oral Given 12/26/23 0004)  pantoprazole  (PROTONIX ) EC tablet 40 mg (40 mg Oral Given 12/26/23 1046)  rOPINIRole  (REQUIP ) tablet 0.5 mg (has no administration in time range)  acetaminophen  (TYLENOL ) tablet 650 mg (has no administration in time range)    Or  acetaminophen  (TYLENOL ) suppository 650 mg (has no administration in time range)  ondansetron  (ZOFRAN ) tablet 4 mg (has no administration in time range)    Or  ondansetron  (ZOFRAN ) injection 4 mg (has no administration in time range)  methylPREDNISolone  sodium succinate (SOLU-MEDROL ) 40 mg/mL injection 40 mg (40 mg Intravenous Given 12/26/23 0548)    Followed by  predniSONE  (DELTASONE ) tablet 40 mg (has no administration in time range)  sodium chloride  flush (NS) 0.9 % injection 3 mL (0 mLs Intravenous Hold 12/26/23 0938)  sodium chloride  flush (NS) 0.9 % injection 3 mL (has no administration in time range)  0.9 %  sodium chloride  infusion (has no administration in time range)  guaiFENesin  (MUCINEX ) 12 hr tablet 600 mg (600 mg Oral Given 12/26/23 1045)  cefTRIAXone  (ROCEPHIN ) 2 g in sodium chloride  0.9 % 100 mL IVPB (0 g Intravenous  Stopped 12/25/23 2124)  azithromycin  (ZITHROMAX ) 500 mg in sodium chloride  0.9 % 250 mL IVPB (0 mg Intravenous Stopped 12/25/23 2226)  guaiFENesin  (ROBITUSSIN) 100 MG/5ML liquid 10 mL (10 mLs Oral Given 12/26/23 1410)  oxyCODONE -acetaminophen  (PERCOCET/ROXICET) 5-325 MG per tablet 1 tablet (1 tablet Oral Given 12/26/23 1632)    And  oxyCODONE  (Oxy IR/ROXICODONE ) immediate release tablet 5 mg (5 mg Oral Given 12/26/23 1642)  azithromycin  (ZITHROMAX ) tablet 500 mg (has no administration in time range)  metoprolol  succinate (TOPROL -XL) 24 hr tablet 100 mg (100 mg Oral Given 12/26/23 1410)  amLODipine  (NORVASC ) tablet 5 mg (5 mg Oral Given 12/26/23 1410)  ipratropium-albuterol  (DUONEB) 0.5-2.5 (3) MG/3ML nebulizer solution 3 mL (has no administration in time range)  revefenacin  (YUPELRI ) nebulizer solution 175 mcg (175 mcg Nebulization Given 12/26/23 1410)  arformoterol  (BROVANA ) nebulizer solution 15 mcg (has no administration in time range)  budesonide  (PULMICORT ) nebulizer solution 0.25 mg (has no administration in time range)  methylPREDNISolone  sodium succinate (SOLU-MEDROL ) 125 mg/2 mL injection 125 mg (125 mg Intravenous Given 12/25/23 1537)  ipratropium-albuterol  (DUONEB) 0.5-2.5 (3) MG/3ML nebulizer solution 3 mL (3 mLs Nebulization Given 12/25/23 1542)  sodium chloride  0.9 % bolus 1,000 mL (0 mLs Intravenous Stopped 12/25/23 1758)  ipratropium-albuterol  (DUONEB) 0.5-2.5 (3) MG/3ML nebulizer solution 3 mL (3 mLs Nebulization Given 12/25/23 1834)    Mobility walks with device     Focused Assessments Pulmonary Assessment Handoff:  Lung sounds: Bilateral Breath Sounds: Diminished O2 Device: Nasal Cannula O2 Flow Rate (L/min): 3 L/min    R Recommendations: See Admitting Provider Note  Report  given to:   Additional Notes: Patient is from [facility], she wears 3L [place] at home and this is baseline. Patient is currently on a purewick.

## 2023-12-27 DIAGNOSIS — Z515 Encounter for palliative care: Secondary | ICD-10-CM | POA: Diagnosis not present

## 2023-12-27 DIAGNOSIS — J9621 Acute and chronic respiratory failure with hypoxia: Secondary | ICD-10-CM | POA: Diagnosis not present

## 2023-12-27 DIAGNOSIS — J4551 Severe persistent asthma with (acute) exacerbation: Secondary | ICD-10-CM | POA: Diagnosis not present

## 2023-12-27 DIAGNOSIS — Z7189 Other specified counseling: Secondary | ICD-10-CM | POA: Diagnosis not present

## 2023-12-27 LAB — COMPREHENSIVE METABOLIC PANEL
ALT: 17 U/L (ref 0–44)
AST: 29 U/L (ref 15–41)
Albumin: 2.8 g/dL — ABNORMAL LOW (ref 3.5–5.0)
Alkaline Phosphatase: 56 U/L (ref 38–126)
Anion gap: 11 (ref 5–15)
BUN: 19 mg/dL (ref 8–23)
CO2: 27 mmol/L (ref 22–32)
Calcium: 8.6 mg/dL — ABNORMAL LOW (ref 8.9–10.3)
Chloride: 100 mmol/L (ref 98–111)
Creatinine, Ser: 0.84 mg/dL (ref 0.44–1.00)
GFR, Estimated: >60 mL/min (ref >60)
Glucose, Bld: 141 mg/dL — ABNORMAL HIGH (ref 70–99)
Potassium: 3.1 mmol/L — ABNORMAL LOW (ref 3.5–5.1)
Sodium: 138 mmol/L (ref 135–145)
Total Bilirubin: 0.4 mg/dL (ref 0.0–1.2)
Total Protein: 7.1 g/dL (ref 6.5–8.1)

## 2023-12-27 LAB — LEGIONELLA PNEUMOPHILA SEROGP 1 UR AG: L. pneumophila Serogp 1 Ur Ag: NEGATIVE

## 2023-12-27 MED ORDER — FLUTICASONE PROPIONATE 50 MCG/ACT NA SUSP
2.0000 | Freq: Every day | NASAL | Status: DC
  Administered 2023-12-27 – 2024-01-09 (×13): 2 via NASAL
  Filled 2023-12-27: qty 16

## 2023-12-27 MED ORDER — PREDNISONE 20 MG PO TABS
40.0000 mg | ORAL_TABLET | Freq: Every day | ORAL | Status: AC
  Administered 2023-12-28 – 2023-12-30 (×3): 40 mg via ORAL
  Filled 2023-12-27 (×3): qty 2

## 2023-12-27 MED ORDER — TRAZODONE HCL 100 MG PO TABS: 100.0000 mg | ORAL_TABLET | Freq: Every evening | ORAL | Status: DC | PRN

## 2023-12-27 MED ORDER — METHYLPREDNISOLONE SODIUM SUCC 40 MG IJ SOLR
40.0000 mg | Freq: Two times a day (BID) | INTRAMUSCULAR | Status: AC
  Administered 2023-12-27 (×2): 40 mg via INTRAVENOUS
  Filled 2023-12-27 (×2): qty 1

## 2023-12-27 MED ORDER — LAMOTRIGINE 100 MG PO TABS
200.0000 mg | ORAL_TABLET | Freq: Every day | ORAL | Status: DC
  Administered 2023-12-27 – 2024-01-09 (×14): 200 mg via ORAL
  Filled 2023-12-27 (×14): qty 2

## 2023-12-27 MED ORDER — TRAZODONE HCL 100 MG PO TABS
200.0000 mg | ORAL_TABLET | Freq: Every evening | ORAL | Status: DC | PRN
  Administered 2023-12-27 – 2023-12-29 (×3): 200 mg via ORAL
  Filled 2023-12-27 (×3): qty 2

## 2023-12-27 MED ORDER — HYDROCODONE BIT-HOMATROP MBR 5-1.5 MG/5ML PO SOLN
5.0000 mL | Freq: Four times a day (QID) | ORAL | Status: DC | PRN
  Administered 2023-12-27 – 2023-12-29 (×2): 5 mL via ORAL
  Filled 2023-12-27 (×2): qty 5

## 2023-12-27 MED ORDER — CYCLOBENZAPRINE HCL 5 MG PO TABS
5.0000 mg | ORAL_TABLET | Freq: Two times a day (BID) | ORAL | Status: DC | PRN
  Administered 2023-12-27 – 2024-01-01 (×6): 5 mg via ORAL
  Filled 2023-12-27 (×6): qty 1

## 2023-12-27 NOTE — Progress Notes (Signed)
   NAME:  Linda [name], MRN:  [id number], DOB:  1946/07/28, LOS: 2 ADMISSION DATE:  12/25/2023, CONSULTATION DATE:  12/25/23 REFERRING MD:  Dr. Silvester, MD CHIEF COMPLAINT:  asthma exacerbation   History of Present Illness:  Linda [name] is a 78 year old woman with history of restrictive lung disease and chronic hypoxemic respiratory failure in setting of asbesosis with pleural plaques along with asthma who presented to ER with wheezing and dyspnea.   PCCM consulted for asthma exacerbation. She has been given duoneb treatment and steroids. She reports feeling better already but continues to have some work of breathing.   She is baseline on 3L of O2, currently on 4-5L in ER. She denies any sick contacts. Reports some chills denies fevers. She does have cough with sputum production.   She is on 5mg  prednisone  daily.  Pertinent  Medical History  CAD CHF Chronic Back Pain [name] with pleural [place] Chronic Hypoxemic Respiratory Failure  Significant Hospital Events: Including procedures, antibiotic start and stop dates in addition to other pertinent events   1/2 Admitted to [facility] for asthma exacerbation 1/3 Mild improvement. On baseline O2 with adequate saturation. Still wheezy. 1/4 Reports some improvement in wheezing this am but not back to baseline yet.   Interim History / Subjective:  Continue to endorse some cough and wheezing with exesion or talking. Remains on home 3L [place]  Objective   Blood pressure (!) 143/67, pulse 73, temperature 97.7 F (36.5 C), temperature source Oral, resp. rate 19, height 5' 4 (1.626 m), weight 70.1 kg, SpO2 95%.        Intake/Output Summary (Last 24 hours) at 12/27/2023 0815 Last data filed at 12/27/2023 0502 Gross per 24 hour  Intake --  Output 350 ml  Net -350 ml   Filed Weights   12/25/23 1444 12/26/23 1758 12/27/23 0453  Weight: 70.9 kg 70 kg 70.1 kg    Examination: General: Acute on chronic ill appearing elderly female lying in bed,  in NAD HEENT: [place]/AT, MM pink/moist, PERRL,  Neuro: Alert and oriented x3, non-focal  CV: s1s2 regular rate and rhythm, no murmur, rubs, or gallops,  PULM:  Bilateral expiratory wheeze worse during coughing episode, improved, on 3L [place] GI: soft, bowel sounds active in all 4 quadrants, non-tender, non-distended, tolerating oral diet Extremities: warm/dry, no edema  Skin: no rashes or lesions  Resolved Hospital Problem list     Assessment & Plan:  Acute on Chronic Hypoxemic Respiratory Failure -On 3L [place] at baseline  Asthma with Acute Exacerbation Restrictive lung disease in setting of asbestosis with pleural plaques CAP RLL rounded opacity -RVP panel negative  P: Wheezing slightly improved but not fully resolved will continue IV solumedrol for one more day and plan to transition to PO prednisone  1/5 Continue Brovana , Pulmicort , and Yupelri  nebs  Empiric Ceftriaxone  and Azithromycin   Encourage pulmonary hygiene  As needed Hycodan   Insomnia  P: Resume home Trazodone  dose of 200mg  at bedtime PRN, patient report this has been her dose for many years now   PCCM will continue to follow   Best Practice (right click and Reselect all SmartList Selections daily)   Per primary  Critical care time: n/a  Niala Stcharles D. Harris, NP-C [facility] Personal contact information can be found on Amion  If no contact or response made please call [phone] 12/27/2023, 8:16 AM

## 2023-12-27 NOTE — Progress Notes (Signed)
 PROGRESS NOTE    Linda [name]  FMW:[id number] DOB: 1946-01-16 DOA: 12/25/2023 PCP: Candise Aleene DEL, MD  Outpatient Specialists:     Brief Narrative:  Patient is a 78 year old female past medical history significant for asthma, asbestosis, chronic respiratory failure on 3 L of supplemental oxygen  per minute, debility, diastolic CHF, chronic back pain and pain syndrome.  Patient was admitted with acute on chronic respiratory failure/asthma exacerbation.  12/26/2023: Patient seen.  Patient remains in significant respiratory distress. 12/27/2023: Patient seen alongside patient's nurse.  Patient is slowly improving.  Respiratory distress has improved significantly, but patient is still wheezing.  No peripheral visual loss.  Continues to cough.  No fever or chills.   Assessment & Plan:   Active Problems:   HTN (hypertension), benign   Chronic pain syndrome   Anxiety   CAD-minor 2014   Chronic diastolic CHF (congestive heart failure) (HCC)   Acute on chronic hypoxic respiratory failure (HCC)   Asthma exacerbation   Present on Admission:  Asthma exacerbation  HTN (hypertension), benign  Acute on chronic respiratory failure (HCC)  Anxiety  CAD-minor 2014  Chronic diastolic CHF (congestive heart failure) (HCC)  Chronic pain syndrome       HTN (hypertension), benign: -Blood pressure control has improved significantly.   Acute on chronic respiratory failure (HCC) -In the setting of worsening COPD/asthma exacerbation/pulmonary fibrosis/asbestosis. -Patient is improving. -Continue IV steroids and nebulizer treatment. -Continue antibiotics. -Pulmonary input is appreciated.   -Palliative care input is appreciated. -Continue supplemental oxygen . -Supportive care.   -flutter valve    Anxiety Continue home dose of Xanax  but decrease frequency to twice daily as needed   Asthma exacerbation -See above documentation. -Continues turning bilateral -Peak flow daily if tolerated.   No peak flow visualized today. -Titrate O2 to saturation >90%.  -Influenza PCR neg  CAD-minor 2014 Hold aspirin  given increased wheezing, continue statin Crestor  40 mg/day Continue Imdur  30 mg.  bid   Chronic diastolic CHF (congestive heart failure) (HCC) Continue home dose of Lasix  20 mg a day monitor for any evidence of fluid overload   Chronic pain syndrome Restart home medications   DVT prophylaxis: Will start patient on Lovenox . Code Status: DO NOT RESUSCITATE/DO NOT INTUBATE Family Communication:  Disposition Plan: Home in the next 1 to 2 days.   Consultants:  Palliative care team. Pulmonary/critical care team.  Procedures:  None.  Antimicrobials:  IV Rocephin . IV azithromycin    Subjective: -Shortness of breath is improving -Wheezing is improving.  Objective: Vitals:   12/27/23 0453 12/27/23 0804 12/27/23 0805 12/27/23 0845  BP: (!) 143/67   139/84  Pulse: 73   74  Resp: 19   20  Temp: 97.7 F (36.5 C)   97.8 F (36.6 C)  TempSrc: Oral   Oral  SpO2: 97% 96% 95% 95%  Weight: 70.1 kg     Height:        Intake/Output Summary (Last 24 hours) at 12/27/2023 1010 Last data filed at 12/27/2023 0900 Gross per 24 hour  Intake --  Output 650 ml  Net -650 ml   Filed Weights   12/25/23 1444 12/26/23 1758 12/27/23 0453  Weight: 70.9 kg 70 kg 70.1 kg    Examination:  General exam: Appears to be in respiratory distress. Respiratory system: Decreased air entry.  Wheezing, but improved.   Cardiovascular system: S1 & S2 heard. Gastrointestinal system: Abdomen is soft and nontender.   Central nervous system: Alert and oriented. No focal neurological deficits. Extremities: Symmetric 5 x  5 power. Skin: No rashes, lesions or ulcers Psychiatry: Judgement and insight appear normal. Mood & affect appropriate.     Data Reviewed: I have personally reviewed following labs and imaging studies  CBC: Recent Labs  Lab 12/25/23 1457 12/25/23 1550 12/26/23 0454  WBC  11.1*  --  8.6  HGB 11.7* 11.6* 10.4*  HCT 36.7 34.0* 33.0*  MCV 89.1  --  88.2  PLT 292  --  266   Basic Metabolic Panel: Recent Labs  Lab 12/25/23 1457 12/25/23 1550 12/26/23 0454  NA 136 137 136  K 3.6 3.1* 3.1*  CL 95*  --  101  CO2 29  --  24  GLUCOSE 109*  --  160*  BUN 14  --  13  CREATININE 1.13*  --  1.11*  CALCIUM  8.4*  --  8.4*  MG  --   --  2.0  PHOS  --   --  3.7   GFR: Estimated Creatinine Clearance: 40.8 mL/min (A) (by C-G formula based on SCr of 1.11 mg/dL (H)). Liver Function Tests: Recent Labs  Lab 12/25/23 1457 12/26/23 0454  AST 19 19  ALT 11 11  ALKPHOS 61 50  BILITOT 0.5 0.5  PROT 6.8 6.4*  ALBUMIN 2.8* 2.6*   No results for input(s): LIPASE, AMYLASE in the last 168 hours. No results for input(s): AMMONIA in the last 168 hours. Coagulation Profile: No results for input(s): INR, PROTIME in the last 168 hours. Cardiac Enzymes: No results for input(s): CKTOTAL, CKMB, CKMBINDEX, TROPONINI in the last 168 hours. BNP (last 3 results) Recent Labs    08/19/23 1027  PROBNP 143.0*   HbA1C: No results for input(s): HGBA1C in the last 72 hours. CBG: No results for input(s): GLUCAP in the last 168 hours. Lipid Profile: No results for input(s): CHOL, HDL, LDLCALC, TRIG, CHOLHDL, LDLDIRECT in the last 72 hours. Thyroid  Function Tests: No results for input(s): TSH, T4TOTAL, FREET4, T3FREE, THYROIDAB in the last 72 hours. Anemia Panel: No results for input(s): VITAMINB12, FOLATE, FERRITIN, TIBC, IRON , RETICCTPCT in the last 72 hours. Urine analysis:    Component Value Date/Time   COLORURINE AMBER (A) 02/23/2023 2057   APPEARANCEUR CLOUDY (A) 02/23/2023 2057   LABSPEC 1.012 02/23/2023 2057   PHURINE 5.0 02/23/2023 2057   GLUCOSEU NEGATIVE 02/23/2023 2057   HGBUR NEGATIVE 02/23/2023 2057   BILIRUBINUR NEGATIVE 02/23/2023 2057   BILIRUBINUR 1+ 01/01/2023 1354   KETONESUR NEGATIVE 02/23/2023  2057   PROTEINUR NEGATIVE 02/23/2023 2057   UROBILINOGEN 1.0 01/01/2023 1354   UROBILINOGEN 1.0 10/16/2015 2009   NITRITE POSITIVE (A) 02/23/2023 2057   LEUKOCYTESUR SMALL (A) 02/23/2023 2057   Sepsis Labs: @LABRCNTIP (procalcitonin:4,lacticidven:4)  ) Recent Results (from the past 240 hours)  Resp panel by RT-PCR (RSV, Flu A&B, Covid) Anterior Nasal Swab     Status: None   Collection Time: 12/25/23  2:57 PM   Specimen: Anterior Nasal Swab  Result Value Ref Range Status   SARS Coronavirus 2 by RT PCR NEGATIVE NEGATIVE Final   Influenza A by PCR NEGATIVE NEGATIVE Final   Influenza B by PCR NEGATIVE NEGATIVE Final    Comment: (NOTE) The Xpert Xpress SARS-CoV-2/FLU/RSV plus assay is intended as an aid in the diagnosis of influenza from Nasopharyngeal swab specimens and should not be used as a sole basis for treatment. Nasal washings and aspirates are unacceptable for Xpert Xpress SARS-CoV-2/FLU/RSV testing.  Fact Sheet for Patients: bloggercourse.com  Fact Sheet for Healthcare Providers: seriousbroker.it  This test is not yet  approved or cleared by the [place]  FDA and has been authorized for detection and/or diagnosis of SARS-CoV-2 by FDA under an Emergency Use Authorization (EUA). This EUA will remain in effect (meaning this test can be used) for the duration of the COVID-19 declaration under Section 564(b)(1) of the Act, 21 U.S.C. section 360bbb-3(b)(1), unless the authorization is terminated or revoked.     Resp Syncytial Virus by PCR NEGATIVE NEGATIVE Final    Comment: (NOTE) Fact Sheet for Patients: bloggercourse.com  Fact Sheet for Healthcare Providers: seriousbroker.it  This test is not yet approved or cleared by the [place]  FDA and has been authorized for detection and/or diagnosis of SARS-CoV-2 by FDA under an Emergency Use Authorization (EUA). This EUA  will remain in effect (meaning this test can be used) for the duration of the COVID-19 declaration under Section 564(b)(1) of the Act, 21 U.S.C. section 360bbb-3(b)(1), unless the authorization is terminated or revoked.  Performed at [facility], [address] [place], [place] [address]   Respiratory (~20 pathogens) panel by PCR     Status: None   Collection Time: 12/25/23  8:27 PM   Specimen: Nasopharyngeal Swab; Respiratory  Result Value Ref Range Status   Adenovirus NOT DETECTED NOT DETECTED Final   Coronavirus 229E NOT DETECTED NOT DETECTED Final    Comment: (NOTE) The Coronavirus on the Respiratory Panel, DOES NOT test for the novel  Coronavirus (2019 nCoV)    Coronavirus HKU1 NOT DETECTED NOT DETECTED Final   Coronavirus NL63 NOT DETECTED NOT DETECTED Final   Coronavirus OC43 NOT DETECTED NOT DETECTED Final   Metapneumovirus NOT DETECTED NOT DETECTED Final   Rhinovirus / Enterovirus NOT DETECTED NOT DETECTED Final   Influenza A NOT DETECTED NOT DETECTED Final   Influenza B NOT DETECTED NOT DETECTED Final   Parainfluenza Virus 1 NOT DETECTED NOT DETECTED Final   Parainfluenza Virus 2 NOT DETECTED NOT DETECTED Final   Parainfluenza Virus 3 NOT DETECTED NOT DETECTED Final   Parainfluenza Virus 4 NOT DETECTED NOT DETECTED Final   Respiratory Syncytial Virus NOT DETECTED NOT DETECTED Final   Bordetella pertussis NOT DETECTED NOT DETECTED Final   Bordetella Parapertussis NOT DETECTED NOT DETECTED Final   Chlamydophila pneumoniae NOT DETECTED NOT DETECTED Final   Mycoplasma pneumoniae NOT DETECTED NOT DETECTED Final    Comment: Performed at [facility], [address], [place], [place] [address]  MRSA Next Gen by PCR, Nasal     Status: None   Collection Time: 12/25/23 11:15 PM   Specimen: Nasal Mucosa; Nasal Swab  Result Value Ref Range Status   MRSA by PCR Next Gen NOT DETECTED NOT DETECTED Final    Comment: (NOTE) The GeneXpert MRSA Assay (FDA approved for  NASAL specimens only), is one component of a comprehensive MRSA colonization surveillance program. It is not intended to diagnose MRSA infection nor to guide or monitor treatment for MRSA infections. Test performance is not FDA approved in patients less than 49 years old. Performed at [facility], [address], [place], [place] [address]          Radiology Studies: CT Chest Wo Contrast Result Date: 12/25/2023 CLINICAL DATA:  Shortness of breath. EXAM: CT CHEST WITHOUT CONTRAST TECHNIQUE: Multidetector CT imaging of the chest was performed following the standard protocol without IV contrast. RADIATION DOSE REDUCTION: This exam was performed according to the departmental dose-optimization program which includes automated exposure control, adjustment of the mA and/or kV according to patient size  and/or use of iterative reconstruction technique. COMPARISON:  Radiograph earlier today.  PET CT 10/08/2023 FINDINGS: Cardiovascular: The heart is upper normal in size. No pericardial effusion. Dense coronary artery calcifications. Moderate aortic atherosclerosis. No aortic aneurysm. Mediastinum/Nodes: Patulous esophagus. Small hiatal hernia. Unchanged 11 mm anterior paratracheal node series 2, image 22. No axillary adenopathy. Subcentimeter right thyroid  nodule. Not clinically significant; no follow-up imaging recommended (ref: J Am Coll Radiol. 2015 Feb;12(2): 143-50). Lungs/Pleura: Extensive bilateral calcified pleural plaques, without significant change from prior exam. Associated subpleural reticulation and scarring within the lateral left lower hemithorax. There is increasing rounded airspace disease adjacent to a pleural plaque in the medial right lower lobe series 7, image 69. left posterior pleural thickening without significant effusion. No pulmonary edema. Upper Abdomen: No acute findings.  Gallstones. Musculoskeletal: Chronic compression deformities of T1, T7, T9 and T12, unchanged from prior  exam no acute osseous findings. IMPRESSION: 1. Extensive bilateral calcified pleural plaques, without significant change from prior exam. Associated subpleural reticulation and scarring within the lateral left lower hemithorax. 2. Increasing rounded airspace disease adjacent to a pleural plaque in the medial right lower lobe, suspicious for pneumonia. Recommend CT follow-up in 6-8 weeks after course of treatment. 3. Unchanged mildly enlarged anterior paratracheal node. 4. Coronary artery calcifications. 5. Cholelithiasis. Aortic Atherosclerosis (ICD10-I70.0). Electronically Signed   By: Andrea Gasman M.D.   On: 12/25/2023 21:53   DG Chest Port 1 View Result Date: 12/25/2023 CLINICAL DATA:  Shortness of breath. EXAM: PORTABLE CHEST 1 VIEW COMPARISON:  Chest radiographs 08/28/2023 and 07/18/2023; PET-CT 10/08/2023 FINDINGS: Redemonstration of multiple calcified bilateral pleural plaques. Moderately decreased lung volumes, decreased from 08/28/2023. Mild left-greater-than-right lower hemithorax ground-glass opacities are similar to prior radiographs and CT given differences in lung volumes and technique. No pneumothorax. No large pleural effusion is seen however the basilar opacities and pleural plaques limit this evaluation. Mild dextrocurvature of the midthoracic spine with mild-to-moderate multilevel degenerative disc changes. Visualized cardiac silhouette is unremarkable. Moderate to high-grade atherosclerotic calcifications within the aortic arch. IMPRESSION: 1. Redemonstration of multiple calcified bilateral pleural plaques. 2. Moderately decreased lung volumes, decreased from 08/28/2023. 3. Mild left-greater-than-right lower hemithorax ground-glass opacities are similar to prior radiographs and CT given differences in lung volumes and technique. This likely reflects a combination of subsegmental atelectasis, pleural plaques, and scarring. Electronically Signed   By: Tanda Lyons M.D.   On: 12/25/2023 16:56         Scheduled Meds:  acetaminophen   1,000 mg Oral Once   amLODipine   5 mg Oral Daily   arformoterol   15 mcg Nebulization BID   azithromycin   500 mg Oral Daily   budesonide  (PULMICORT ) nebulizer solution  0.25 mg Nebulization BID   DULoxetine   60 mg Oral Daily   enoxaparin  (LOVENOX ) injection  40 mg Subcutaneous Q24H   fluticasone   2 spray Each Nare Daily   furosemide   20 mg Oral Daily   gabapentin   300 mg Oral BID   guaiFENesin   600 mg Oral BID   guaiFENesin   10 mL Oral Q6H   isosorbide  mononitrate  30 mg Oral BID   lamoTRIgine   200 mg Oral Daily   methylPREDNISolone  (SOLU-MEDROL ) injection  40 mg Intravenous Q12H   Followed by   NOREEN ON 12/28/2023] predniSONE   40 mg Oral Q breakfast   metoprolol  succinate  100 mg Oral Daily   montelukast   10 mg Oral QHS   pantoprazole   40 mg Oral BID   revefenacin   175 mcg Nebulization Daily  sodium chloride  flush  3 mL Intravenous Q12H   Continuous Infusions:  azithromycin  500 mg (12/26/23 2207)   cefTRIAXone  (ROCEPHIN )  IV 2 g (12/26/23 2335)     LOS: 2 days    Time spent: 35 minutes    Leatrice Chapel, MD  [facility] Pager #: [phone] 7PM-7AM contact night coverage as above

## 2023-12-27 NOTE — Evaluation (Signed)
 Physical Therapy Evaluation Patient Details Name: Linda [name] MRN: [id number] DOB: [birth year] Today's Date: 12/27/2023  History of Present Illness  Pt is a 78 y.o. female who presents 12/25/23 with asthma exacerbation. PMH - chronic bil knee pain due to osteoarthritis, chronic low back pain, anxiety/depression, osteoporosis, chf, copd, htn, fibromyalgia, PE.   Clinical Impression  Pt presents with condition above and deficits mentioned below, see PT Problem List. PTA, she was mod I for functional mobility, not utilizing an AD in the home but utilizing a rollator for longer distance mobility. She resides in an ILF. Currently, pt is functioning close to her baseline, not displaying any LOB or need for assistance when utilizing a rollator to ambulate. She displays deficits in balance and activity tolerance. Pt reports she would like to resume her HH massage therapy upon return to her ILF. Will continue to follow acutely to maximize her independence and safety with functional mobility prior to d/c.        If plan is discharge home, recommend the following: Assistance with cooking/housework;Assist for transportation   Can travel by private vehicle        Equipment Recommendations None recommended by PT  Recommendations for Other Services       Functional Status Assessment Patient has had a recent decline in their functional status and demonstrates the ability to make significant improvements in function in a reasonable and predictable amount of time.     Precautions / Restrictions Precautions Precautions: Fall Precaution Comments: 3L O2 at all times Restrictions Weight Bearing Restrictions Per Provider Order: No      Mobility  Bed Mobility Overal bed mobility: Modified Independent             General bed mobility comments: No assistance needed, HOB elevated    Transfers Overall transfer level: Needs assistance Equipment used: None Transfers: Sit to/from Stand, Bed to  chair/wheelchair/BSC Sit to Stand: Supervision           General transfer comment: supervision for safety, no LOB    Ambulation/Gait Ambulation/Gait assistance: Supervision Gait Distance (Feet): 450 Feet Assistive device: Rollator (4 wheels) Gait Pattern/deviations: Step-through pattern, Decreased stride length, Trunk flexed Gait velocity: reduced Gait velocity interpretation: >2.62 ft/sec, indicative of community ambulatory   General Gait Details: Pt ambulates with a kyphotic flexed posture. no overt LOB, supervision for safety  Stairs            Wheelchair Mobility     Tilt Bed    Modified Rankin (Stroke Patients Only)       Balance Overall balance assessment: Mild deficits observed, not formally tested                                           Pertinent Vitals/Pain Pain Assessment Pain Assessment: Faces Faces Pain Scale: Hurts little more Pain Location: knees Pain Descriptors / Indicators: Discomfort, Grimacing Pain Intervention(s): Limited activity within patient's tolerance, Monitored during session    Home Living Family/patient expects to be discharged to:: Private residence Living Arrangements: Alone Available Help at Discharge: Personal care attendant Type of Home: Independent living facility Home Access: Level entry       Home Layout: One level Home Equipment: Shower seat;Cane - single point;Wheelchair - [employer] Editor (4 wheels) Additional Comments: IND living facility, has been getting PT/OT and massage therapy 5X/wk.    Prior Function Prior Level of Function :  Independent/Modified Independent             Mobility Comments: uses rollator for longer distances, no AD in the home ADLs Comments: staff helps with cleaning     Extremity/Trunk Assessment   Upper Extremity Assessment Upper Extremity Assessment: Defer to OT evaluation    Lower Extremity Assessment Lower Extremity Assessment:  Generalized weakness    Cervical / Trunk Assessment Cervical / Trunk Assessment: Kyphotic  Communication   Communication Communication: No apparent difficulties  Cognition Arousal: Alert Behavior During Therapy: WFL for tasks assessed/performed Overall Cognitive Status: Within Functional Limits for tasks assessed                                          General Comments General comments (skin integrity, edema, etc.): SpO2 >/= 86% on 3-4L O2 when ambulating, >/= 90% on 3L at rest    Exercises     Assessment/Plan    PT Assessment Patient needs continued PT services  PT Problem List Decreased strength;Decreased activity tolerance;Decreased balance;Decreased mobility;Cardiopulmonary status limiting activity       PT Treatment Interventions DME instruction;Gait training;Functional mobility training;Therapeutic activities;Therapeutic exercise;Neuromuscular re-education;Balance training;Patient/family education    PT Goals (Current goals can be found in the Care Plan section)  Acute Rehab PT Goals Patient Stated Goal: to improve and return to her ILF PT Goal Formulation: With patient Time For Goal Achievement: 01/10/24 Potential to Achieve Goals: Good    Frequency Min 1X/week     Co-evaluation               AM-PAC PT 6 Clicks Mobility  Outcome Measure Help needed turning from your back to your side while in a flat bed without using bedrails?: None Help needed moving from lying on your back to sitting on the side of a flat bed without using bedrails?: None Help needed moving to and from a bed to a chair (including a wheelchair)?: A Little Help needed standing up from a chair using your arms (e.g., wheelchair or bedside chair)?: A Little Help needed to walk in hospital room?: A Little Help needed climbing 3-5 steps with a railing? : A Little 6 Click Score: 20    End of Session Equipment Utilized During Treatment: Oxygen  Activity Tolerance: Patient  tolerated treatment well Patient left: in bed;with bed alarm set;with call bell/phone within reach Nurse Communication: Mobility status PT Visit Diagnosis: Unsteadiness on feet (R26.81);Other abnormalities of gait and mobility (R26.89);Muscle weakness (generalized) (M62.81)    Time: 8576-8558 PT Time Calculation (min) (ACUTE ONLY): 18 min   Charges:   PT Evaluation $PT Eval Low Complexity: 1 Low   PT General Charges $$ ACUTE PT VISIT: 1 Visit         Theo Ferretti, PT, DPT Acute Rehabilitation Services  Office: [phone]   Theo CHRISTELLA Ferretti 12/27/2023, 4:53 PM

## 2023-12-27 NOTE — Progress Notes (Signed)
 Daily Progress Note   Patient Name: Linda [name]       Date: 12/27/2023 DOB: 09-30-1946  Age: 78 y.o. MRN#: [id number] Attending Physician: Rosario Leatrice FERNS, MD Primary Care Physician: Candise Aleene DEL, MD Admit Date: 12/25/2023  Reason for Consultation/Follow-up: Establishing goals of care  Length of Stay: 2  Current Medications: Scheduled Meds:  . acetaminophen   1,000 mg Oral Once  . amLODipine   5 mg Oral Daily  . arformoterol   15 mcg Nebulization BID  . azithromycin   500 mg Oral Daily  . budesonide  (PULMICORT ) nebulizer solution  0.25 mg Nebulization BID  . DULoxetine   60 mg Oral Daily  . enoxaparin  (LOVENOX ) injection  40 mg Subcutaneous Q24H  . fluticasone   2 spray Each Nare Daily  . furosemide   20 mg Oral Daily  . gabapentin   300 mg Oral BID  . guaiFENesin   600 mg Oral BID  . guaiFENesin   10 mL Oral Q6H  . isosorbide  mononitrate  30 mg Oral BID  . lamoTRIgine   200 mg Oral Daily  . methylPREDNISolone  (SOLU-MEDROL ) injection  40 mg Intravenous Q12H   Followed by  . [START ON 12/28/2023] predniSONE   40 mg Oral Q breakfast  . metoprolol  succinate  100 mg Oral Daily  . montelukast   10 mg Oral QHS  . pantoprazole   40 mg Oral BID  . revefenacin   175 mcg Nebulization Daily  . sodium chloride  flush  3 mL Intravenous Q12H    Continuous Infusions: . cefTRIAXone  (ROCEPHIN )  IV 2 g (12/26/23 2335)    PRN Meds: acetaminophen  **OR** acetaminophen , ALPRAZolam , cyclobenzaprine , HYDROcodone  bit-homatropine, ipratropium-albuterol , loratadine , ondansetron  **OR** ondansetron  (ZOFRAN ) IV, oxyCODONE -acetaminophen  **AND** oxyCODONE , rOPINIRole , sodium chloride  flush, traZODone   Physical Exam Vitals reviewed.  HENT:     Head: Normocephalic and atraumatic.  Cardiovascular:     Rate  and Rhythm: Normal rate.  Pulmonary:     Effort: Pulmonary effort is normal.     Comments: cough Neurological:     Mental Status: She is alert and oriented to person, place, and time.  Psychiatric:        Mood and Affect: Mood normal.             Vital Signs: BP 139/84 (BP Location: Right Arm)   Pulse 74   Temp 97.8 F (36.6 C) (Oral)   Resp  20   Ht 5' 4 (1.626 m)   Wt 70.1 kg   SpO2 95%   BMI 26.53 kg/m  SpO2: SpO2: 95 % O2 Device: O2 Device: Nasal Cannula O2 Flow Rate: O2 Flow Rate (L/min): 3 L/min   Patient Active Problem List   Diagnosis Date Noted  . Asthma exacerbation 12/25/2023  . Severe persistent asthma, uncomplicated 11/05/2023  . Acute non-recurrent sphenoidal sinusitis 07/14/2023  . Acute bacterial sinusitis 07/13/2023  . Moderate persistent asthma with acute exacerbation 07/13/2023  . Acute respiratory failure with hypoxia (HCC) 07/11/2023  . Subacute cough 07/10/2023  . Acute on chronic hypoxic respiratory failure (HCC) 07/06/2023  . Vocal cord dysfunction 07/06/2023  . Thoracic vertebral fracture (HCC) 02/26/2022  . Fall 02/16/2022  . Maxillary fracture (HCC) 02/16/2022  . Right orbital fracture (HCC) 02/16/2022  . Right ureteral stone 11/30/2021  . Chronic pain of left knee 10/18/2021  . Spondylosis of lumbar spine 08/23/2021  . Acute encephalopathy 11/24/2020  . Dysphagia 06/15/2020  . Allergic reaction 02/01/2020  . Idiopathic angioedema 01/20/2020  . Dysarthria 01/20/2020  . Hypotension 10/04/2019  . COPD (chronic obstructive pulmonary disease) (HCC) 01/21/2018  . Pulmonary asbestosis (HCC) 01/21/2018  . Hypomagnesemia 01/02/2018  . Failure to thrive in adult 01/01/2018  . Altered mental status 10/21/2017  . Lumbar radiculitis 02/10/2017  . Low serum erythropoietin  level 10/17/2016  . RBBB 09/23/2016  . Primary osteoarthritis of left knee 06/19/2016  . Debilitated patient 06/06/2016  . Chronic diastolic CHF (congestive heart failure)  (HCC) 05/30/2016  . Chronic respiratory failure with hypoxia (HCC) 02/07/2016  . Restrictive lung disease 02/07/2016  . Rotator cuff syndrome of right shoulder 01/10/2016  . Encephalopathy, metabolic 01/01/2016  . Fall at home 01/01/2016  . Rhabdomyolysis 01/01/2016  . Diastolic heart failure (HCC) 10/16/2015  . CAD-minor 2014 08/16/2015  . Chest pain with moderate risk for cardiac etiology 08/16/2015  . Narrowing of intervertebral disc space 07/17/2015  . Bilateral lower leg pain 01/24/2015  . 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 Chronic pain syndrome 11/21/2014  . Anxiety 11/21/2014  . Hypokalemia 11/21/2014  . Hyperlipidemia 11/21/2014  . Chronic pain disorder 11/21/2014  . Primary osteoarthritis of right knee 10/18/2014  . Pernicious anemia 08/24/2014  . Generalized anxiety disorder--with occasional panic attacks.  08/05/2014  . Recurrent major depression-severe (HCC) 08/05/2014  . Multifactorial gait disorder 07/26/2014  . Epistaxis 07/18/2014  . Acute GI bleeding 07/17/2014  . Anemia associated with acute blood loss 07/17/2014  . Syncope 07/17/2014  . History of pulmonary embolism 07/17/2014  . GI bleed 07/17/2014  . Arthritis 05/11/2014  . DDD (degenerative disc disease) 05/11/2014  . Fibrositis 05/11/2014  . Amianthosis (HCC) 05/11/2014  . [name] (HCC) 05/11/2014  . Grief 04/28/2014  . OSA (obstructive sleep apnea) 04/24/2014  . Chronic heart failure with preserved ejection fraction (HFpEF) (HCC) 04/24/2014  . Atelectasis 08/06/2013  . Acute pulmonary embolism (HCC) 08/04/2013  . Pleural plaque with presence of asbestos 07/22/2013  . Polypharmacy 04/26/2013  . Chronic pain 03/01/2013  . Insomnia 11/12/2012  . HTN (hypertension), benign 10/25/2012    Palliative Care Assessment & Plan   Patient Profile: 78 y.o. female  with past medical history of asthma, asbestosis, diastolic CHF, chronic back pain, CAD, and chronic respiratory failure on 3L [place]  admitted on 12/25/2023 from independent living with wheezing, SOB, and cough.    Being treated for acute on chronic hypoxemic respiratory failure in setting of asbesosis with pleural plaques along with asthma.  Today's Discussion: Patient states her breathing is much improved from yesterday. She continues to cough. She has met with both the attending and her pulmonology NP this morning.   She admits our conversation yesterday was a lot for her to consider. She is glad her son was available yesterday evening. She shares the special bond she has with her family and is glad of the support they offer each other. She plans on reviewing the MOST document and will let PMT know if they would like to complete the document while she is hospitalized. She plans to discharge back to her independent living facility. She is agreeable to having outpatient palliative medicine support for symptom management. PMT will support as needed.  Recommendations/Plan: DNR DNI Treat the treatable Encouraged continued discussion between patient and her family re: goals of care Consult to outpatient palliative PMT support as needed   Code Status:    Code Status Orders  (From admission, onward)           Start     Ordered   12/25/23 1908  Do not attempt resuscitation (DNR)- Limited -Do Not Intubate (DNI)  Continuous       Question Answer Comment  If pulseless and not breathing No CPR or chest compressions.   In Pre-Arrest Conditions (Patient Is Breathing and Has A Pulse) Do not intubate. Provide all appropriate non-invasive medical interventions. Avoid ICU transfer unless indicated or required.   Consent: Discussion documented in EHR or advanced directives reviewed      12/25/23 1909            Extensive chart review has been completed prior to seeing the patient including labs, vital signs, imaging, progress/consult notes, orders, medications, and available advance directive documents.    Care plan was  discussed with bedside RN  Time spent: 50 minutes  Thank you for allowing the Palliative Medicine Team to assist in the care of this patient.    Stephane CHRISTELLA Palin, NP  Please contact Palliative Medicine Team phone at [phone] for questions and concerns.

## 2023-12-27 NOTE — Plan of Care (Signed)
 Pt adhering to safety education and able to state safety precautions

## 2023-12-28 ENCOUNTER — Inpatient Hospital Stay (HOSPITAL_COMMUNITY): Payer: Medicare Other

## 2023-12-28 DIAGNOSIS — R0603 Acute respiratory distress: Secondary | ICD-10-CM | POA: Diagnosis not present

## 2023-12-28 DIAGNOSIS — J45901 Unspecified asthma with (acute) exacerbation: Secondary | ICD-10-CM | POA: Diagnosis not present

## 2023-12-28 DIAGNOSIS — J9621 Acute and chronic respiratory failure with hypoxia: Secondary | ICD-10-CM | POA: Diagnosis not present

## 2023-12-28 LAB — ECHOCARDIOGRAM COMPLETE
AR max vel: 3.05 cm2
AV Area VTI: 3.07 cm2
AV Area mean vel: 2.97 cm2
AV Mean grad: 4 mm[Hg]
AV Peak grad: 7.4 mm[Hg]
Ao pk vel: 1.36 m/s
Area-P 1/2: 2.48 cm2
Calc EF: 63.7 %
Est EF: >75
Height: 64 in
S' Lateral: 1.8 cm
Single Plane A2C EF: 63.5 %
Single Plane A4C EF: 65.7 %
Weight: 2486.79 [oz_av]

## 2023-12-28 LAB — COMPREHENSIVE METABOLIC PANEL
ALT: 23 U/L (ref 0–44)
AST: 32 U/L (ref 15–41)
Albumin: 2.9 g/dL — ABNORMAL LOW (ref 3.5–5.0)
Alkaline Phosphatase: 59 U/L (ref 38–126)
Anion gap: 12 (ref 5–15)
BUN: 17 mg/dL (ref 8–23)
CO2: 30 mmol/L (ref 22–32)
Calcium: 9 mg/dL (ref 8.9–10.3)
Chloride: 98 mmol/L (ref 98–111)
Creatinine, Ser: 0.87 mg/dL (ref 0.44–1.00)
GFR, Estimated: >60 mL/min (ref >60)
Glucose, Bld: 158 mg/dL — ABNORMAL HIGH (ref 70–99)
Potassium: 2.9 mmol/L — ABNORMAL LOW (ref 3.5–5.1)
Sodium: 140 mmol/L (ref 135–145)
Total Bilirubin: 0.6 mg/dL (ref 0.0–1.2)
Total Protein: 7.1 g/dL (ref 6.5–8.1)

## 2023-12-28 LAB — TROPONIN I (HIGH SENSITIVITY): Troponin I (High Sensitivity): 7 ng/L (ref <18)

## 2023-12-28 LAB — PROCALCITONIN: Procalcitonin: <0.1 ng/mL

## 2023-12-28 LAB — BRAIN NATRIURETIC PEPTIDE: B Natriuretic Peptide: 307.3 pg/mL — ABNORMAL HIGH (ref 0.0–100.0)

## 2023-12-28 LAB — MAGNESIUM: Magnesium: 2.1 mg/dL (ref 1.7–2.4)

## 2023-12-28 MED ORDER — LORATADINE 10 MG PO TABS
10.0000 mg | ORAL_TABLET | Freq: Every day | ORAL | Status: DC
  Administered 2023-12-28 – 2024-01-09 (×13): 10 mg via ORAL
  Filled 2023-12-28 (×14): qty 1

## 2023-12-28 MED ORDER — POTASSIUM CHLORIDE CRYS ER 20 MEQ PO TBCR
40.0000 meq | EXTENDED_RELEASE_TABLET | Freq: Once | ORAL | Status: AC
  Administered 2023-12-28: 40 meq via ORAL
  Filled 2023-12-28: qty 2

## 2023-12-28 MED ORDER — ADULT MULTIVITAMIN W/MINERALS CH
1.0000 | ORAL_TABLET | Freq: Every day | ORAL | Status: DC
  Administered 2023-12-28 – 2024-01-09 (×13): 1 via ORAL
  Filled 2023-12-28 (×13): qty 1

## 2023-12-28 MED ORDER — FUROSEMIDE 10 MG/ML IJ SOLN
40.0000 mg | Freq: Every day | INTRAMUSCULAR | Status: DC
  Administered 2023-12-28 – 2023-12-31 (×4): 40 mg via INTRAVENOUS
  Filled 2023-12-28 (×4): qty 4

## 2023-12-28 MED ORDER — ENSURE ENLIVE PO LIQD
237.0000 mL | Freq: Two times a day (BID) | ORAL | Status: DC
Start: 2023-12-28 — End: 2024-01-05
  Administered 2024-01-03 (×2): 237 mL via ORAL

## 2023-12-28 MED ORDER — SODIUM CHLORIDE 0.9 % IV SOLN
1.0000 g | INTRAVENOUS | Status: DC
  Administered 2023-12-28 – 2023-12-29 (×2): 1 g via INTRAVENOUS
  Filled 2023-12-28 (×2): qty 10

## 2023-12-28 MED ORDER — POTASSIUM CHLORIDE 20 MEQ PO PACK
40.0000 meq | PACK | ORAL | Status: AC
  Administered 2023-12-28: 40 meq via ORAL
  Filled 2023-12-28 (×2): qty 2

## 2023-12-28 NOTE — Progress Notes (Signed)
 NAME:  Linda [name], MRN:  [id number], DOB:  January 06, 1946, LOS: 3 ADMISSION DATE:  12/25/2023, CONSULTATION DATE:  12/25/23 REFERRING MD:  Dr. Silvester, MD CHIEF COMPLAINT:  asthma exacerbation   History of Present Illness:  Linda [name] is a 78 year old woman with history of restrictive lung disease and chronic hypoxemic respiratory failure in setting of asbesosis with pleural plaques along with asthma who presented to ER with wheezing and dyspnea.   PCCM consulted for asthma exacerbation. She has been given duoneb treatment and steroids. She reports feeling better already but continues to have some work of breathing.   She is baseline on 3L of O2, currently on 4-5L in ER. She denies any sick contacts. Reports some chills denies fevers. She does have cough with sputum production.   She is on 5mg  prednisone  daily.  Pertinent  Medical History  CAD CHF Chronic Back Pain [name] with pleural [place] Chronic Hypoxemic Respiratory Failure  Significant Hospital Events: Including procedures, antibiotic start and stop dates in addition to other pertinent events   1/2 Admitted to [facility] for asthma exacerbation 1/3 Mild improvement. On baseline O2 with adequate saturation. Still wheezy. 1/4 Reports some improvement in wheezing this am but not back to baseline yet.   Interim History / Subjective:  Continues to feel short of breath. Feels like she can't go home until we figure this out  Objective   Blood pressure (!) 152/62, pulse 63, temperature 98.1 F (36.7 C), temperature source Oral, resp. rate 19, height 5' 4 (1.626 m), weight 70.5 kg, SpO2 97%.        Intake/Output Summary (Last 24 hours) at 12/28/2023 1248 Last data filed at 12/28/2023 9166 Gross per 24 hour  Intake 440 ml  Output 2160 ml  Net -1720 ml   Filed Weights   12/26/23 1758 12/27/23 0453 12/28/23 0546  Weight: 70 kg 70.1 kg 70.5 kg    Examination: Chronically and acutely ill appearing, on nasal  cannula Laying flat, appears debiliated Wheeze best auscultated with forced expiration over the neck Breath sounds otherwise diminished   Procal <0.1  Resolved Hospital Problem list     Assessment & Plan:  Acute on Chronic Hypoxemic Respiratory Failure  3L [place] at baseline  Asthma with Acute Exacerbation (on tespire as outpatient) Restrictive lung disease in setting of asbestosis with pleural plaques FVC 41% in 2015 CAP RLL rounded opacity (probable rounded atelectasis) GERD -RVP panel negative, PCT P: I sincerely doubt her wheezing is related to asthma given minimal improvement with IV steroids and predominance of upper airway wheezing around the neck with forced expiration. Non asthmatic etiology for wheezing includes - tracheobronchomalacia, vocal cord edema/irritation, EILO - taper steroids as ordered Continue Brovana , Pulmicort , and Yupelri  nebs  Empiric Ceftriaxone  and Azithromycin   Encourage pulmonary hygiene  As needed Hycodan which seems to be helping. CT Chest ordered previously to follow up on sinus disease for her chronic cough Echocardiogram pending  I suspect there are two issues at play here - once is UACS, the other is worsening restrictive lung disease from her exposure to asbestosis from both her husband and father who were employed in [employer].   PCCM will continue to follow   Best Practice (right click and Reselect all SmartList Selections daily)   Per primary  Verdon Gore, MD Pulmonary and Critical Care Medicine [facility] 12/28/2023 12:48 PM Pager: see AMION  If no response to pager, please call critical care on call (see AMION) until 7pm After 7:00  pm call Elink

## 2023-12-28 NOTE — Plan of Care (Signed)
   Problem: Clinical Measurements: Goal: Ability to maintain clinical measurements within normal limits will improve Outcome: Progressing Goal: Respiratory complications will improve Outcome: Progressing

## 2023-12-28 NOTE — Progress Notes (Signed)
 Pt refused KLOR-CON packet.  She asked for the pill form> MD informed and asked to give in pill form.  Pt had only one dose remaining.

## 2023-12-28 NOTE — Progress Notes (Signed)
 Initial Nutrition Assessment  DOCUMENTATION CODES:   Not applicable  INTERVENTION:  Liberalize diet Ensure Plus High Protein po BID, each supplement provides 350 kcal and 20 grams of protein. Multivitamin with minerals   NUTRITION DIAGNOSIS:   Increased nutrient needs related to chronic illness as evidenced by estimated needs.    GOAL:   Patient will meet greater than or equal to 90% of their needs    MONITOR:   PO intake, Supplement acceptance  REASON FOR ASSESSMENT:   Consult Assessment of nutrition requirement/status  ASSESSMENT: 78 y.o. F, Presented from independent living facility via EMS, with complaints of. Wheezing, shortness of breath and coughing. Admitted with Severe persistent asthma/COPD pulmonary asbestosis with exacerbation.  PMH: HTN, fibromyalgia, syncope, DDD, anxiety, CHF, MDD, CAD. Reached out to pt via phone with no answer. Unable to obtain nutritional history at this time. All information obtained from EMR. Review of EMR revealed; Weight trending down, fair appetite to good appetite, independent feeding ability. No chewing or swallowing concerns noted a this time. There is no benefit to excessive dietary restrictions related advanced age, increased nutrient needs. Patient would benefit better from liberalized diet to help meet increased nutrient needs and promote better oral intake.  Weight history: 12/28/23 70.5 kg  12/23/23 70.9 kg  12/11/23 70.1 kg  11/25/23 70.8 kg  11/03/23 74.2 kg  10/29/23 74.8 kg  10/15/23 75.8 kg  10/07/23 74.8 kg  09/23/23 74.1 kg  09/03/23 73.8 kg   Hospital weight history: Date/Time Weight Weight in lbs  12/28/23 0546 70.5 kg 155.42 lbs  12/27/23 0453 70.1 kg 154.54 lbs  12/26/23 1758 70 kg 154.32 lbs  12/25/23 1444 70.9 kg 156.31 lb    Average Meal Intake: 70-80: 76% intake x 4 recorded meals  Nutritionally Relevant Medications: Scheduled Meds:  amLODipine   5 mg Oral Daily   furosemide   40 mg  Intravenous Daily   gabapentin   300 mg Oral BID   potassium chloride   40 mEq Oral Q4H    Labs Reviewed: CBG ranges from 158-141 mg/dL over the last 24 hours     NUTRITION - FOCUSED PHYSICAL EXAM:  Deferred   Diet Order:   Diet Order             Diet Heart Room service appropriate? Yes; Fluid consistency: Thin  Diet effective now                   EDUCATION NEEDS:   No education needs have been identified at this time  Skin:  Skin Assessment: Reviewed RN Assessment  Last BM:  1/3  Height:   Ht Readings from Last 1 Encounters:  12/26/23 5' 4 (1.626 m)    Weight:   Wt Readings from Last 1 Encounters:  12/28/23 70.5 kg    Ideal Body Weight:     BMI:  Body mass index is 26.68 kg/m.  Estimated Nutritional Needs:   Kcal:  7874-7524 kcal  Protein:  95-110 g  Fluid:  57ml/kcal    Jenna Pew RDN, LDN Clinical Dietitian   If unable to reach, please contact RD Inpatient secure chat group between 8 am-4 pm daily

## 2023-12-28 NOTE — Progress Notes (Addendum)
 PROGRESS NOTE    Linda [name]  FMW:[id number] DOB: 1946-02-21 DOA: 12/25/2023 PCP: Candise Aleene DEL, MD  Outpatient Specialists:     Brief Narrative:  Patient is a 78 year old female past medical history significant for asthma, asbestosis, chronic respiratory failure on 3 L of supplemental oxygen  per minute, debility, diastolic CHF, chronic back pain and pain syndrome.  Patient was admitted with acute on chronic respiratory failure/asthma exacerbation.  12/28/2023: Patient seen.  Patient continues to have wheeze.  Overall, patient is moderately improved.  Elevated cardiac BNP.  Echo is pending.  CT sinuses revealed acute sinusitis (bilateral maxillary and sphenoid).  Patient is on azithromycin .  Will change to oral Lasix  20 Mg to IV Lasix  40 Mg once daily.  Follow RAST allergy.  Assessment & Plan:   Active Problems:   HTN (hypertension), benign   Chronic pain syndrome   Anxiety   CAD-minor 2014   Chronic diastolic CHF (congestive heart failure) (HCC)   Acute on chronic hypoxic respiratory failure (HCC)   Asthma exacerbation   Present on Admission:  Asthma exacerbation  HTN (hypertension), benign  Acute on chronic respiratory failure (HCC)  Anxiety  CAD-minor 2014  Chronic diastolic CHF (congestive heart failure) (HCC)  Chronic pain syndrome       HTN (hypertension), benign: -Continue to optimize. -Change oral Lasix  to IV Lasix . -Goal blood pressure should be less than 130/80 mmHg.     Acute on chronic respiratory failure (HCC) -In the setting of worsening COPD/asthma exacerbation/pulmonary fibrosis/asbestosis. -Patient is improving. -IV steroids has been changed to oral prednisone  40 Mg once daily.   -Continue nebulizer treatment. -Antibiotics have been changed to oral azithromycin .   -CT scan of the sinuses revealed acute sinusitis.   -Pulmonary input is appreciated.   -Palliative care input is appreciated. -Continue supplemental oxygen . -Supportive care.    -flutter valve   Acute sinusitis (maxillary and sphenoid): -Flonase . -Restart IV Rocephin .  Discontinue azithromycin .  Patient is allergic to penicillin.    -Supportive care. -Loratadine  10 Mg p.o. once daily.   Anxiety Continue home dose of Xanax  but decrease frequency to twice daily as needed   Asthma exacerbation -See above documentation. -Peak flow daily if tolerated.  No peak flow visualized today. -Titrate O2 to saturation >90%.  -Influenza PCR neg  CAD-minor 2014 Hold aspirin  given increased wheezing, continue statin Crestor  40 mg/day Continue Imdur  30 mg.  bid   Chronic diastolic CHF (congestive heart failure) (HCC) Continue home dose of Lasix  20 mg a day monitor for any evidence of fluid overload   Chronic pain syndrome Restart home medications   DVT prophylaxis: Will start patient on Lovenox . Code Status: DO NOT RESUSCITATE/DO NOT INTUBATE Family Communication:  Disposition Plan: Home in the next 1 to 2 days.   Consultants:  Palliative care team. Pulmonary/critical care team.  Procedures:  None.  Antimicrobials:  IV Rocephin  discontinued IV azithromycin  changed to oral Start Augmentin.   Subjective: -Shortness of breath is improving -Wheezing is improving.  Objective: Vitals:   12/28/23 0546 12/28/23 0835 12/28/23 0854 12/28/23 1133  BP:  (!) 141/62  (!) 152/62  Pulse:  62 63 63  Resp:  17 (!) 22 19  Temp:  97.9 F (36.6 C)  98.1 F (36.7 C)  TempSrc:  Oral  Oral  SpO2:  96% 95% 97%  Weight: 70.5 kg     Height:        Intake/Output Summary (Last 24 hours) at 12/28/2023 1232 Last data filed at 12/28/2023 9166 Gross  per 24 hour  Intake 440 ml  Output 2160 ml  Net -1720 ml   Filed Weights   12/26/23 1758 12/27/23 0453 12/28/23 0546  Weight: 70 kg 70.1 kg 70.5 kg    Examination:  General exam: Appears to be in respiratory distress. Respiratory system: Decreased air entry.  Wheezing, but improved.   Cardiovascular system: S1 & S2  heard. Gastrointestinal system: Abdomen is soft and nontender.   Central nervous system: Alert and oriented. No focal neurological deficits. Extremities: Symmetric 5 x 5 power. Skin: No rashes, lesions or ulcers Psychiatry: Judgement and insight appear normal. Mood & affect appropriate.     Data Reviewed: I have personally reviewed following labs and imaging studies  CBC: Recent Labs  Lab 12/25/23 1457 12/25/23 1550 12/26/23 0454  WBC 11.1*  --  8.6  HGB 11.7* 11.6* 10.4*  HCT 36.7 34.0* 33.0*  MCV 89.1  --  88.2  PLT 292  --  266   Basic Metabolic Panel: Recent Labs  Lab 12/25/23 1457 12/25/23 1550 12/26/23 0454 12/27/23 1147 12/28/23 0242  NA 136 137 136 138 140  K 3.6 3.1* 3.1* 3.1* 2.9*  CL 95*  --  101 100 98  CO2 29  --  24 27 30   GLUCOSE 109*  --  160* 141* 158*  BUN 14  --  13 19 17   CREATININE 1.13*  --  1.11* 0.84 0.87  CALCIUM  8.4*  --  8.4* 8.6* 9.0  MG  --   --  2.0  --   --   PHOS  --   --  3.7  --   --    GFR: Estimated Creatinine Clearance: 52.1 mL/min (by C-G formula based on SCr of 0.87 mg/dL). Liver Function Tests: Recent Labs  Lab 12/25/23 1457 12/26/23 0454 12/27/23 1147 12/28/23 0242  AST 19 19 29  32  ALT 11 11 17 23   ALKPHOS 61 50 56 59  BILITOT 0.5 0.5 0.4 0.6  PROT 6.8 6.4* 7.1 7.1  ALBUMIN 2.8* 2.6* 2.8* 2.9*   No results for input(s): LIPASE, AMYLASE in the last 168 hours. No results for input(s): AMMONIA in the last 168 hours. Coagulation Profile: No results for input(s): INR, PROTIME in the last 168 hours. Cardiac Enzymes: No results for input(s): CKTOTAL, CKMB, CKMBINDEX, TROPONINI in the last 168 hours. BNP (last 3 results) Recent Labs    08/19/23 1027  PROBNP 143.0*   HbA1C: No results for input(s): HGBA1C in the last 72 hours. CBG: No results for input(s): GLUCAP in the last 168 hours. Lipid Profile: No results for input(s): CHOL, HDL, LDLCALC, TRIG, CHOLHDL, LDLDIRECT in the  last 72 hours. Thyroid  Function Tests: No results for input(s): TSH, T4TOTAL, FREET4, T3FREE, THYROIDAB in the last 72 hours. Anemia Panel: No results for input(s): VITAMINB12, FOLATE, FERRITIN, TIBC, IRON , RETICCTPCT in the last 72 hours. Urine analysis:    Component Value Date/Time   COLORURINE AMBER (A) 02/23/2023 2057   APPEARANCEUR CLOUDY (A) 02/23/2023 2057   LABSPEC 1.012 02/23/2023 2057   PHURINE 5.0 02/23/2023 2057   GLUCOSEU NEGATIVE 02/23/2023 2057   HGBUR NEGATIVE 02/23/2023 2057   BILIRUBINUR NEGATIVE 02/23/2023 2057   BILIRUBINUR 1+ 01/01/2023 1354   KETONESUR NEGATIVE 02/23/2023 2057   PROTEINUR NEGATIVE 02/23/2023 2057   UROBILINOGEN 1.0 01/01/2023 1354   UROBILINOGEN 1.0 10/16/2015 2009   NITRITE POSITIVE (A) 02/23/2023 2057   LEUKOCYTESUR SMALL (A) 02/23/2023 2057   Sepsis Labs: @LABRCNTIP (procalcitonin:4,lacticidven:4)  ) Recent Results (from the past 240  hours)  Resp panel by RT-PCR (RSV, Flu A&B, Covid) Anterior Nasal Swab     Status: None   Collection Time: 12/25/23  2:57 PM   Specimen: Anterior Nasal Swab  Result Value Ref Range Status   SARS Coronavirus 2 by RT PCR NEGATIVE NEGATIVE Final   Influenza A by PCR NEGATIVE NEGATIVE Final   Influenza B by PCR NEGATIVE NEGATIVE Final    Comment: (NOTE) The Xpert Xpress SARS-CoV-2/FLU/RSV plus assay is intended as an aid in the diagnosis of influenza from Nasopharyngeal swab specimens and should not be used as a sole basis for treatment. Nasal washings and aspirates are unacceptable for Xpert Xpress SARS-CoV-2/FLU/RSV testing.  Fact Sheet for Patients: bloggercourse.com  Fact Sheet for Healthcare Providers: seriousbroker.it  This test is not yet approved or cleared by the [place]  FDA and has been authorized for detection and/or diagnosis of SARS-CoV-2 by FDA under an Emergency Use Authorization (EUA). This EUA will remain in  effect (meaning this test can be used) for the duration of the COVID-19 declaration under Section 564(b)(1) of the Act, 21 U.S.C. section 360bbb-3(b)(1), unless the authorization is terminated or revoked.     Resp Syncytial Virus by PCR NEGATIVE NEGATIVE Final    Comment: (NOTE) Fact Sheet for Patients: bloggercourse.com  Fact Sheet for Healthcare Providers: seriousbroker.it  This test is not yet approved or cleared by the [place]  FDA and has been authorized for detection and/or diagnosis of SARS-CoV-2 by FDA under an Emergency Use Authorization (EUA). This EUA will remain in effect (meaning this test can be used) for the duration of the COVID-19 declaration under Section 564(b)(1) of the Act, 21 U.S.C. section 360bbb-3(b)(1), unless the authorization is terminated or revoked.  Performed at [facility], [address], [place], [place] [address]   Respiratory (~20 pathogens) panel by PCR     Status: None   Collection Time: 12/25/23  8:27 PM   Specimen: Nasopharyngeal Swab; Respiratory  Result Value Ref Range Status   Adenovirus NOT DETECTED NOT DETECTED Final   Coronavirus 229E NOT DETECTED NOT DETECTED Final    Comment: (NOTE) The Coronavirus on the Respiratory Panel, DOES NOT test for the novel  Coronavirus (2019 nCoV)    Coronavirus HKU1 NOT DETECTED NOT DETECTED Final   Coronavirus NL63 NOT DETECTED NOT DETECTED Final   Coronavirus OC43 NOT DETECTED NOT DETECTED Final   Metapneumovirus NOT DETECTED NOT DETECTED Final   Rhinovirus / Enterovirus NOT DETECTED NOT DETECTED Final   Influenza A NOT DETECTED NOT DETECTED Final   Influenza B NOT DETECTED NOT DETECTED Final   Parainfluenza Virus 1 NOT DETECTED NOT DETECTED Final   Parainfluenza Virus 2 NOT DETECTED NOT DETECTED Final   Parainfluenza Virus 3 NOT DETECTED NOT DETECTED Final   Parainfluenza Virus 4 NOT DETECTED NOT DETECTED Final   Respiratory  Syncytial Virus NOT DETECTED NOT DETECTED Final   Bordetella pertussis NOT DETECTED NOT DETECTED Final   Bordetella Parapertussis NOT DETECTED NOT DETECTED Final   Chlamydophila pneumoniae NOT DETECTED NOT DETECTED Final   Mycoplasma pneumoniae NOT DETECTED NOT DETECTED Final    Comment: Performed at [facility], [address], [place], [place] [address]  MRSA Next Gen by PCR, Nasal     Status: None   Collection Time: 12/25/23 11:15 PM   Specimen: Nasal Mucosa; Nasal Swab  Result Value Ref Range Status   MRSA by PCR Next Gen NOT DETECTED NOT DETECTED Final    Comment: (NOTE) The GeneXpert  MRSA Assay (FDA approved for NASAL specimens only), is one component of a comprehensive MRSA colonization surveillance program. It is not intended to diagnose MRSA infection nor to guide or monitor treatment for MRSA infections. Test performance is not FDA approved in patients less than 24 years old. Performed at [facility], [address], [place], [place] [address]          Radiology Studies: CT SINUS WO CONTRAST Result Date: 12/28/2023 CLINICAL DATA:  Sinusitis. EXAM: CT MAXILLOFACIAL WITHOUT CONTRAST TECHNIQUE: Multidetector CT images of the paranasal sinuses were obtained using the standard protocol without intravenous contrast. RADIATION DOSE REDUCTION: This exam was performed according to the departmental dose-optimization program which includes automated exposure control, adjustment of the mA and/or kV according to patient size and/or use of iterative reconstruction technique. COMPARISON:  Maxillofacial CT 07/12/2023 FINDINGS: Paranasal sinuses: Frontal: Normally aerated. Patent frontal sinus drainage pathways. Ethmoid: Mild mucosal thickening bilaterally, left greater than right. Maxillary: Circumferential mucosal thickening and moderate to large fluid levels bilaterally. Sphenoid: Subtotal opacification of the right sphenoid sinus by mucosal thickening and fluid. Minimal fluid in  the left sphenoid sinus. Effacement of the sphenoid sinus ostia bilaterally. Partial effacement of the sphenoethmoidal recesses, right greater than left. Right ostiomeatal unit: Opacification of the maxillary sinus ostium and infundibulum. Left ostiomeatal unit: Opacification of the maxillary sinus ostium and proximal infundibulum. Nasal passages: Asymmetric left inferior nasal turbinate hypertrophy with adjacent mucosal thickening or secretions in the nasal cavity. Paradoxical rotation of the middle turbinates. 3 mm rightward deviation of the nasal septum. Anatomy: No pneumatization superior to anterior ethmoid notches. Keros II. Sellar sphenoid pneumatization pattern. No dehiscence of carotid or optic canals. No onodi cell. Other: Clear mastoid air cells and middle ear cavities. Small amount of material in the right external auditory canal, likely cerumen. Bilateral cataract extraction. IMPRESSION: Findings compatible with acute sinusitis including bilateral maxillary and sphenoid sinus fluid. Electronically Signed   By: Dasie Hamburg M.D.   On: 12/28/2023 12:05        Scheduled Meds:  acetaminophen   1,000 mg Oral Once   amLODipine   5 mg Oral Daily   arformoterol   15 mcg Nebulization BID   azithromycin   500 mg Oral Daily   budesonide  (PULMICORT ) nebulizer solution  0.25 mg Nebulization BID   DULoxetine   60 mg Oral Daily   enoxaparin  (LOVENOX ) injection  40 mg Subcutaneous Q24H   fluticasone   2 spray Each Nare Daily   furosemide   20 mg Oral Daily   gabapentin   300 mg Oral BID   guaiFENesin   600 mg Oral BID   guaiFENesin   10 mL Oral Q6H   isosorbide  mononitrate  30 mg Oral BID   lamoTRIgine   200 mg Oral Daily   metoprolol  succinate  100 mg Oral Daily   montelukast   10 mg Oral QHS   pantoprazole   40 mg Oral BID   predniSONE   40 mg Oral Q breakfast   revefenacin   175 mcg Nebulization Daily   sodium chloride  flush  3 mL Intravenous Q12H   Continuous Infusions:     LOS: 3 days    Time  spent: 35 minutes    Leatrice Chapel, MD  [facility] Pager #: [phone] 7PM-7AM contact night coverage as above

## 2023-12-28 NOTE — Progress Notes (Signed)
 Echocardiogram 2D Echocardiogram has been performed.  Edson Deridder N Malajah Oceguera,RDCS 12/28/2023, 11:36 AM

## 2023-12-29 DIAGNOSIS — J4551 Severe persistent asthma with (acute) exacerbation: Secondary | ICD-10-CM | POA: Diagnosis not present

## 2023-12-29 DIAGNOSIS — J45901 Unspecified asthma with (acute) exacerbation: Secondary | ICD-10-CM

## 2023-12-29 LAB — COMPREHENSIVE METABOLIC PANEL
ALT: 22 U/L (ref 0–44)
AST: 25 U/L (ref 15–41)
Albumin: 2.7 g/dL — ABNORMAL LOW (ref 3.5–5.0)
Alkaline Phosphatase: 55 U/L (ref 38–126)
Anion gap: 11 (ref 5–15)
BUN: 21 mg/dL (ref 8–23)
CO2: 29 mmol/L (ref 22–32)
Calcium: 8.4 mg/dL — ABNORMAL LOW (ref 8.9–10.3)
Chloride: 98 mmol/L (ref 98–111)
Creatinine, Ser: 0.93 mg/dL (ref 0.44–1.00)
GFR, Estimated: >60 mL/min (ref >60)
Glucose, Bld: 118 mg/dL — ABNORMAL HIGH (ref 70–99)
Potassium: 3.5 mmol/L (ref 3.5–5.1)
Sodium: 138 mmol/L (ref 135–145)
Total Bilirubin: 0.4 mg/dL (ref 0.0–1.2)
Total Protein: 6.4 g/dL — ABNORMAL LOW (ref 6.5–8.1)

## 2023-12-29 LAB — PHOSPHORUS: Phosphorus: 3.5 mg/dL (ref 2.5–4.6)

## 2023-12-29 LAB — SEDIMENTATION RATE: Sed Rate: 41 mm/h — ABNORMAL HIGH (ref 0–22)

## 2023-12-29 LAB — MAGNESIUM: Magnesium: 2.3 mg/dL (ref 1.7–2.4)

## 2023-12-29 MED ORDER — LEVOFLOXACIN 500 MG PO TABS
500.0000 mg | ORAL_TABLET | Freq: Every day | ORAL | Status: AC
  Administered 2023-12-29 – 2024-01-07 (×10): 500 mg via ORAL
  Filled 2023-12-29 (×10): qty 1

## 2023-12-29 MED ORDER — MAGIC MOUTHWASH W/LIDOCAINE
5.0000 mL | Freq: Three times a day (TID) | ORAL | Status: DC
Start: 2023-12-29 — End: 2024-01-02
  Administered 2023-12-29 – 2024-01-02 (×12): 5 mL via ORAL
  Filled 2023-12-29 (×14): qty 5

## 2023-12-29 MED ORDER — FAMOTIDINE 20 MG PO TABS
20.0000 mg | ORAL_TABLET | Freq: Every day | ORAL | Status: DC
  Administered 2023-12-29 – 2024-01-08 (×11): 20 mg via ORAL
  Filled 2023-12-29 (×11): qty 1

## 2023-12-29 MED ORDER — HYDROCODONE BIT-HOMATROP MBR 5-1.5 MG/5ML PO SOLN
10.0000 mL | Freq: Four times a day (QID) | ORAL | Status: DC | PRN
  Administered 2023-12-29 – 2024-01-05 (×23): 10 mL via ORAL
  Filled 2023-12-29 (×23): qty 10

## 2023-12-29 NOTE — Progress Notes (Signed)
 PROGRESS NOTE    Linda [name]  FMW:[id number] DOB: Jan 12, 1946 DOA: 12/25/2023 PCP: Candise Aleene DEL, MD  Outpatient Specialists:     Brief Narrative:  Patient is a 78 year old female past medical history significant for asthma, asbestosis, chronic respiratory failure on 3 L of supplemental oxygen  per minute, debility, diastolic CHF, chronic back pain and pain syndrome.  Patient was admitted with acute on chronic respiratory failure/asthma exacerbation.  12/29/2023: Patient seen.  Patient is slowly improving.  Patient continues to wheeze, but improved.  Patient continued to have significant cough.  Patient is asking for opiate-based cough suppressant.  Critical care medicine team input is appreciated.  Patient is known to ENT team and has had vocal cord checked out in the past.    Assessment & Plan:   Active Problems:   HTN (hypertension), benign   Chronic pain syndrome   Anxiety   CAD-minor 2014   Chronic diastolic CHF (congestive heart failure) (HCC)   Acute on chronic hypoxic respiratory failure (HCC)   Moderate asthma with acute exacerbation   Present on Admission:  Asthma exacerbation  HTN (hypertension), benign  Acute on chronic respiratory failure (HCC)  Anxiety  CAD-minor 2014  Chronic diastolic CHF (congestive heart failure) (HCC)  Chronic pain syndrome       HTN (hypertension), benign: -Continue to optimize. -Blood pressure is controlled. -Goal blood pressure should be less than 130/80 mmHg.     Acute on chronic respiratory failure (HCC) -In the setting of worsening COPD/asthma exacerbation/pulmonary fibrosis/asbestosis. -Patient is improving. -IV steroids has been changed to oral prednisone  40 Mg once daily.   -Continue nebulizer treatment. -Antibiotics have been changed to oral Levaquin  (CT scan revealed sinusitis).     -CT scan of the sinuses revealed acute sinusitis.   -Pulmonary input is appreciated.   -Palliative care input is  appreciated. -Continue supplemental oxygen . -Supportive care.   -flutter valve  -Cough persists. -Patient is asking for opiate-based cough medicine. -Low threshold to rule out possible reflux as contributing to coughing.  Acute sinusitis (maxillary and sphenoid): -Flonase . -IV Rocephin  discontinued.  Oral Levaquin  started today.  L -Supportive care. -Loratadine  10 Mg p.o. once daily.   Anxiety Continue home dose of Xanax  but decrease frequency to twice daily as needed   Asthma exacerbation -See above documentation. -Peak flow daily if tolerated.  No peak flow visualized today. -Titrate O2 to saturation >90%.  -Influenza PCR neg  CAD-minor 2014 Hold aspirin  given increased wheezing, continue statin Crestor  40 mg/day Continue Imdur  30 mg.  bid   Chronic diastolic CHF (congestive heart failure) (HCC) Continue home dose of Lasix  20 mg a day monitor for any evidence of fluid overload   Chronic pain syndrome Restart home medications   DVT prophylaxis: Will start patient on Lovenox . Code Status: DO NOT RESUSCITATE/DO NOT INTUBATE Family Communication:  Disposition Plan: Home in the next 1 to 2 days.   Consultants:  Palliative care team. Pulmonary/critical care team.  Procedures:  None.  Antimicrobials:  IV Rocephin  discontinued IV azithromycin  discontinued. Levaquin .   Subjective: -Shortness of breath has improved. -Wheezing is improving. -Coughing remains significant.  Objective: Vitals:   12/29/23 0732 12/29/23 0833 12/29/23 1052 12/29/23 1507  BP: 108/86  103/64 122/63  Pulse: 64 75 70 65  Resp: 17 20 20 17   Temp: 97.9 F (36.6 C)   98.4 F (36.9 C)  TempSrc: Oral   Oral  SpO2: 94%  98% 96%  Weight:      Height:  Intake/Output Summary (Last 24 hours) at 12/29/2023 1725 Last data filed at 12/29/2023 1306 Gross per 24 hour  Intake 1204 ml  Output 2050 ml  Net -846 ml   Filed Weights   12/27/23 0453 12/28/23 0546 12/29/23 0515  Weight: 70.1  kg 70.5 kg 69.6 kg    Examination:  General exam: Appears to be in respiratory distress. Respiratory system: Improved air entry, with wheezing (query from upper airways) Cardiovascular system: S1 & S2 heard. Gastrointestinal system: Abdomen is soft and nontender.   Central nervous system: Alert and oriented. No focal neurological deficits. Extremities: Symmetric 5 x 5 power. Skin: No rashes, lesions or ulcers Psychiatry: Judgement and insight appear normal. Mood & affect appropriate.     Data Reviewed: I have personally reviewed following labs and imaging studies  CBC: Recent Labs  Lab 12/25/23 1457 12/25/23 1550 12/26/23 0454  WBC 11.1*  --  8.6  HGB 11.7* 11.6* 10.4*  HCT 36.7 34.0* 33.0*  MCV 89.1  --  88.2  PLT 292  --  266   Basic Metabolic Panel: Recent Labs  Lab 12/25/23 1457 12/25/23 1550 12/26/23 0454 12/27/23 1147 12/28/23 0242 12/28/23 0808 12/29/23 0237  NA 136 137 136 138 140  --  138  K 3.6 3.1* 3.1* 3.1* 2.9*  --  3.5  CL 95*  --  101 100 98  --  98  CO2 29  --  24 27 30   --  29  GLUCOSE 109*  --  160* 141* 158*  --  118*  BUN 14  --  13 19 17   --  21  CREATININE 1.13*  --  1.11* 0.84 0.87  --  0.93  CALCIUM  8.4*  --  8.4* 8.6* 9.0  --  8.4*  MG  --   --  2.0  --   --  2.1 2.3  PHOS  --   --  3.7  --   --   --  3.5   GFR: Estimated Creatinine Clearance: 48.5 mL/min (by C-G formula based on SCr of 0.93 mg/dL). Liver Function Tests: Recent Labs  Lab 12/25/23 1457 12/26/23 0454 12/27/23 1147 12/28/23 0242 12/29/23 0237  AST 19 19 29  32 25  ALT 11 11 17 23 22   ALKPHOS 61 50 56 59 55  BILITOT 0.5 0.5 0.4 0.6 0.4  PROT 6.8 6.4* 7.1 7.1 6.4*  ALBUMIN 2.8* 2.6* 2.8* 2.9* 2.7*   No results for input(s): LIPASE, AMYLASE in the last 168 hours. No results for input(s): AMMONIA in the last 168 hours. Coagulation Profile: No results for input(s): INR, PROTIME in the last 168 hours. Cardiac Enzymes: No results for input(s): CKTOTAL,  CKMB, CKMBINDEX, TROPONINI in the last 168 hours. BNP (last 3 results) Recent Labs    08/19/23 1027  PROBNP 143.0*   HbA1C: No results for input(s): HGBA1C in the last 72 hours. CBG: No results for input(s): GLUCAP in the last 168 hours. Lipid Profile: No results for input(s): CHOL, HDL, LDLCALC, TRIG, CHOLHDL, LDLDIRECT in the last 72 hours. Thyroid  Function Tests: No results for input(s): TSH, T4TOTAL, FREET4, T3FREE, THYROIDAB in the last 72 hours. Anemia Panel: No results for input(s): VITAMINB12, FOLATE, FERRITIN, TIBC, IRON , RETICCTPCT in the last 72 hours. Urine analysis:    Component Value Date/Time   COLORURINE AMBER (A) 02/23/2023 2057   APPEARANCEUR CLOUDY (A) 02/23/2023 2057   LABSPEC 1.012 02/23/2023 2057   PHURINE 5.0 02/23/2023 2057   GLUCOSEU NEGATIVE 02/23/2023 2057   HGBUR NEGATIVE  02/23/2023 2057   BILIRUBINUR NEGATIVE 02/23/2023 2057   BILIRUBINUR 1+ 01/01/2023 1354   KETONESUR NEGATIVE 02/23/2023 2057   PROTEINUR NEGATIVE 02/23/2023 2057   UROBILINOGEN 1.0 01/01/2023 1354   UROBILINOGEN 1.0 10/16/2015 2009   NITRITE POSITIVE (A) 02/23/2023 2057   LEUKOCYTESUR SMALL (A) 02/23/2023 2057   Sepsis Labs: @LABRCNTIP (procalcitonin:4,lacticidven:4)  ) Recent Results (from the past 240 hours)  Resp panel by RT-PCR (RSV, Flu A&B, Covid) Anterior Nasal Swab     Status: None   Collection Time: 12/25/23  2:57 PM   Specimen: Anterior Nasal Swab  Result Value Ref Range Status   SARS Coronavirus 2 by RT PCR NEGATIVE NEGATIVE Final   Influenza A by PCR NEGATIVE NEGATIVE Final   Influenza B by PCR NEGATIVE NEGATIVE Final    Comment: (NOTE) The Xpert Xpress SARS-CoV-2/FLU/RSV plus assay is intended as an aid in the diagnosis of influenza from Nasopharyngeal swab specimens and should not be used as a sole basis for treatment. Nasal washings and aspirates are unacceptable for Xpert Xpress  SARS-CoV-2/FLU/RSV testing.  Fact Sheet for Patients: bloggercourse.com  Fact Sheet for Healthcare Providers: seriousbroker.it  This test is not yet approved or cleared by the [place]  FDA and has been authorized for detection and/or diagnosis of SARS-CoV-2 by FDA under an Emergency Use Authorization (EUA). This EUA will remain in effect (meaning this test can be used) for the duration of the COVID-19 declaration under Section 564(b)(1) of the Act, 21 U.S.C. section 360bbb-3(b)(1), unless the authorization is terminated or revoked.     Resp Syncytial Virus by PCR NEGATIVE NEGATIVE Final    Comment: (NOTE) Fact Sheet for Patients: bloggercourse.com  Fact Sheet for Healthcare Providers: seriousbroker.it  This test is not yet approved or cleared by the [place]  FDA and has been authorized for detection and/or diagnosis of SARS-CoV-2 by FDA under an Emergency Use Authorization (EUA). This EUA will remain in effect (meaning this test can be used) for the duration of the COVID-19 declaration under Section 564(b)(1) of the Act, 21 U.S.C. section 360bbb-3(b)(1), unless the authorization is terminated or revoked.  Performed at [facility], [address], [place], [place] [address]   Respiratory (~20 pathogens) panel by PCR     Status: None   Collection Time: 12/25/23  8:27 PM   Specimen: Nasopharyngeal Swab; Respiratory  Result Value Ref Range Status   Adenovirus NOT DETECTED NOT DETECTED Final   Coronavirus 229E NOT DETECTED NOT DETECTED Final    Comment: (NOTE) The Coronavirus on the Respiratory Panel, DOES NOT test for the novel  Coronavirus (2019 nCoV)    Coronavirus HKU1 NOT DETECTED NOT DETECTED Final   Coronavirus NL63 NOT DETECTED NOT DETECTED Final   Coronavirus OC43 NOT DETECTED NOT DETECTED Final   Metapneumovirus NOT DETECTED NOT DETECTED Final    Rhinovirus / Enterovirus NOT DETECTED NOT DETECTED Final   Influenza A NOT DETECTED NOT DETECTED Final   Influenza B NOT DETECTED NOT DETECTED Final   Parainfluenza Virus 1 NOT DETECTED NOT DETECTED Final   Parainfluenza Virus 2 NOT DETECTED NOT DETECTED Final   Parainfluenza Virus 3 NOT DETECTED NOT DETECTED Final   Parainfluenza Virus 4 NOT DETECTED NOT DETECTED Final   Respiratory Syncytial Virus NOT DETECTED NOT DETECTED Final   Bordetella pertussis NOT DETECTED NOT DETECTED Final   Bordetella Parapertussis NOT DETECTED NOT DETECTED Final   Chlamydophila pneumoniae NOT DETECTED NOT DETECTED Final   Mycoplasma pneumoniae NOT DETECTED NOT DETECTED Final    Comment:  Performed at [facility], [address], [place], [place] [address]  MRSA Next Gen by PCR, Nasal     Status: None   Collection Time: 12/25/23 11:15 PM   Specimen: Nasal Mucosa; Nasal Swab  Result Value Ref Range Status   MRSA by PCR Next Gen NOT DETECTED NOT DETECTED Final    Comment: (NOTE) The GeneXpert MRSA Assay (FDA approved for NASAL specimens only), is one component of a comprehensive MRSA colonization surveillance program. It is not intended to diagnose MRSA infection nor to guide or monitor treatment for MRSA infections. Test performance is not FDA approved in patients less than 60 years old. Performed at [facility], [address] [place], [place] [address]          Radiology Studies: ECHOCARDIOGRAM COMPLETE Result Date: 12/28/2023    ECHOCARDIOGRAM REPORT   Patient Name:   RACHANA MALESKY Date of Exam: 12/28/2023 Medical Rec #:  [id number]          Height:       64.0 in Accession #:    [id number]         Weight:       155.4 lb Date of Birth:  [birth year]          BSA:          1.758 m Patient Age:    77 years           BP:           150/76 mmHg Patient Gender: F                  HR:           64 bpm. Exam Location:  Inpatient Procedure: 2D Echo, Color Doppler and Cardiac Doppler Indications:     Acute Respiratoy distress  History:        Patient has prior history of Echocardiogram examinations, most                 recent 02/14/2022. CHF, CAD, COPD and Pulmonary Embolism,                 Arrythmias:RBBB, Signs/Symptoms:Syncope; Risk Factors:Former                 Smoker, Hypertension and Sleep Apnea.  Sonographer:    Logan Shove RDCS Referring Phys: [phone] [facility]  Sonographer Comments: Image acquisition challenging due to respiratory motion. IMPRESSIONS  1. Left ventricular ejection fraction, by estimation, is >75%. The left ventricle has hyperdynamic function. The left ventricle has no regional wall motion abnormalities. There is mild left ventricular hypertrophy. Left ventricular diastolic parameters are consistent with Grade I diastolic dysfunction (impaired relaxation). Elevated left atrial pressure.  2. Right ventricular systolic function is normal. The right ventricular size is normal.  3. Left atrial size was moderately dilated.  4. The mitral valve is abnormal. Mild mitral valve regurgitation. No evidence of mitral stenosis.  5. The aortic valve is tricuspid. Aortic valve regurgitation is not visualized. No aortic stenosis is present.  6. The inferior vena cava is normal in size with greater than 50% respiratory variability, suggesting right atrial pressure of 3 mmHg. FINDINGS  Left Ventricle: Left ventricular ejection fraction, by estimation, is >75%. The left ventricle has hyperdynamic function. The left ventricle has no regional wall motion abnormalities. The left ventricular internal cavity size was normal in size. There is mild left ventricular hypertrophy. Left ventricular diastolic parameters are consistent with Grade I  diastolic dysfunction (impaired relaxation). Elevated left atrial pressure. Right Ventricle: The right ventricular size is normal. Right vetricular wall thickness was not well visualized. Right ventricular systolic function is normal. Left Atrium: Left atrial size was  moderately dilated. Right Atrium: Right atrial size was normal in size. Pericardium: There is no evidence of pericardial effusion. Mitral Valve: The mitral valve is abnormal. Mild mitral valve regurgitation. No evidence of mitral valve stenosis. Tricuspid Valve: The tricuspid valve is not well visualized. Tricuspid valve regurgitation is not demonstrated. No evidence of tricuspid stenosis. Aortic Valve: The aortic valve is tricuspid. Aortic valve regurgitation is not visualized. No aortic stenosis is present. Aortic valve mean gradient measures 4.0 mmHg. Aortic valve peak gradient measures 7.4 mmHg. Aortic valve area, by VTI measures 3.07 cm. Pulmonic Valve: The pulmonic valve was not well visualized. Pulmonic valve regurgitation is mild. No evidence of pulmonic stenosis. Aorta: The aortic root and ascending aorta are structurally normal, with no evidence of dilitation. Venous: The inferior vena cava is normal in size with greater than 50% respiratory variability, suggesting right atrial pressure of 3 mmHg. IAS/Shunts: The interatrial septum was not well visualized.  LEFT VENTRICLE PLAX 2D LVIDd:         4.00 cm     Diastology LVIDs:         1.80 cm     LV e' medial:    4.13 cm/s LV PW:         0.90 cm     LV E/e' medial:  23.6 LV IVS:        1.10 cm     LV e' lateral:   5.77 cm/s LVOT diam:     2.00 cm     LV E/e' lateral: 16.9 LV SV:         95 LV SV Index:   54 LVOT Area:     3.14 cm  LV Volumes (MOD) LV vol d, MOD A2C: 73.1 ml LV vol d, MOD A4C: 74.7 ml LV vol s, MOD A2C: 26.7 ml LV vol s, MOD A4C: 25.6 ml LV SV MOD A2C:     46.4 ml LV SV MOD A4C:     74.7 ml LV SV MOD BP:      47.1 ml RIGHT VENTRICLE             IVC RV Basal diam:  3.00 cm     IVC diam: 1.90 cm RV S prime:     13.70 cm/s TAPSE (M-mode): 1.4 cm LEFT ATRIUM             Index        RIGHT ATRIUM           Index LA diam:        4.40 cm 2.50 cm/m   RA Area:     12.80 cm LA Vol (A2C):   70.5 ml 40.11 ml/m  RA Volume:   28.60 ml  16.27 ml/m LA  Vol (A4C):   68.7 ml 39.09 ml/m LA Biplane Vol: 74.7 ml 42.50 ml/m  AORTIC VALVE AV Area (Vmax):    3.05 cm AV Area (Vmean):   2.97 cm AV Area (VTI):     3.07 cm AV Vmax:           136.00 cm/s AV Vmean:          93.700 cm/s AV VTI:            0.308 m AV Peak Grad:  7.4 mmHg AV Mean Grad:      4.0 mmHg LVOT Vmax:         132.00 cm/s LVOT Vmean:        88.600 cm/s LVOT VTI:          0.301 m LVOT/AV VTI ratio: 0.98  AORTA Ao Root diam: 2.80 cm Ao Asc diam:  3.40 cm MITRAL VALVE MV Area (PHT): 2.48 cm     SHUNTS MV Decel Time: 306 msec     Systemic VTI:  0.30 m MV E velocity: 97.50 cm/s   Systemic Diam: 2.00 cm MV A velocity: 124.00 cm/s MV E/A ratio:  0.79 Dorn Ross MD Electronically signed by Dorn Ross MD Signature Date/Time: 12/28/2023/1:45:35 PM    Final    CT SINUS WO CONTRAST Result Date: 12/28/2023 CLINICAL DATA:  Sinusitis. EXAM: CT MAXILLOFACIAL WITHOUT CONTRAST TECHNIQUE: Multidetector CT images of the paranasal sinuses were obtained using the standard protocol without intravenous contrast. RADIATION DOSE REDUCTION: This exam was performed according to the departmental dose-optimization program which includes automated exposure control, adjustment of the mA and/or kV according to patient size and/or use of iterative reconstruction technique. COMPARISON:  Maxillofacial CT 07/12/2023 FINDINGS: Paranasal sinuses: Frontal: Normally aerated. Patent frontal sinus drainage pathways. Ethmoid: Mild mucosal thickening bilaterally, left greater than right. Maxillary: Circumferential mucosal thickening and moderate to large fluid levels bilaterally. Sphenoid: Subtotal opacification of the right sphenoid sinus by mucosal thickening and fluid. Minimal fluid in the left sphenoid sinus. Effacement of the sphenoid sinus ostia bilaterally. Partial effacement of the sphenoethmoidal recesses, right greater than left. Right ostiomeatal unit: Opacification of the maxillary sinus ostium and infundibulum. Left  ostiomeatal unit: Opacification of the maxillary sinus ostium and proximal infundibulum. Nasal passages: Asymmetric left inferior nasal turbinate hypertrophy with adjacent mucosal thickening or secretions in the nasal cavity. Paradoxical rotation of the middle turbinates. 3 mm rightward deviation of the nasal septum. Anatomy: No pneumatization superior to anterior ethmoid notches. Keros II. Sellar sphenoid pneumatization pattern. No dehiscence of carotid or optic canals. No onodi cell. Other: Clear mastoid air cells and middle ear cavities. Small amount of material in the right external auditory canal, likely cerumen. Bilateral cataract extraction. IMPRESSION: Findings compatible with acute sinusitis including bilateral maxillary and sphenoid sinus fluid. Electronically Signed   By: Dasie Hamburg M.D.   On: 12/28/2023 12:05        Scheduled Meds:  acetaminophen   1,000 mg Oral Once   amLODipine   5 mg Oral Daily   arformoterol   15 mcg Nebulization BID   budesonide  (PULMICORT ) nebulizer solution  0.25 mg Nebulization BID   DULoxetine   60 mg Oral Daily   enoxaparin  (LOVENOX ) injection  40 mg Subcutaneous Q24H   famotidine   20 mg Oral QHS   feeding supplement  237 mL Oral BID BM   fluticasone   2 spray Each Nare Daily   furosemide   40 mg Intravenous Daily   gabapentin   300 mg Oral BID   guaiFENesin   600 mg Oral BID   guaiFENesin   10 mL Oral Q6H   isosorbide  mononitrate  30 mg Oral BID   lamoTRIgine   200 mg Oral Daily   levofloxacin   500 mg Oral Daily   loratadine   10 mg Oral Daily   magic mouthwash w/lidocaine   5 mL Oral TID   metoprolol  succinate  100 mg Oral Daily   montelukast   10 mg Oral QHS   multivitamin with minerals  1 tablet Oral Daily   pantoprazole   40 mg Oral  BID   predniSONE   40 mg Oral Q breakfast   revefenacin   175 mcg Nebulization Daily   sodium chloride  flush  3 mL Intravenous Q12H   Continuous Infusions:     LOS: 4 days    Time spent: 35 minutes    Leatrice Chapel, MD  [facility] Pager #: [phone] 7PM-7AM contact night coverage as above

## 2023-12-29 NOTE — Progress Notes (Signed)
   NAME:  Linda [name], MRN:  [id number], DOB:  May 19, 1946, LOS: 4 ADMISSION DATE:  12/25/2023, CONSULTATION DATE:  12/25/23 REFERRING MD:  Dr. Silvester, MD CHIEF COMPLAINT:  asthma exacerbation   History of Present Illness:  Linda [name] is a 78 year old woman with history of restrictive lung disease and chronic hypoxemic respiratory failure in setting of asbesosis with pleural plaques along with asthma who presented to ER with wheezing and dyspnea.   PCCM consulted for asthma exacerbation. She has been given duoneb treatment and steroids. She reports feeling better already but continues to have some work of breathing.   She is baseline on 3L of O2, currently on 4-5L in ER. She denies any sick contacts. Reports some chills denies fevers. She does have cough with sputum production.   She is on 5mg  prednisone  daily.  Pertinent  Medical History  CAD CHF Chronic Back Pain [name] with pleural Plaques Chronic Hypoxemic Respiratory Failure  Significant Hospital Events: Including procedures, antibiotic start and stop dates in addition to other pertinent events   1/2 Admitted to [facility] for asthma exacerbation 1/3 Mild improvement. On baseline O2 with adequate saturation. Still wheezy. 1/4 Reports some improvement in wheezing this am but not back to baseline yet.   Interim History / Subjective:  Still having coughing fits worsened by talking.  Objective   Blood pressure 103/64, pulse 70, temperature 97.9 F (36.6 C), temperature source Oral, resp. rate 20, height 5' 4 (1.626 m), weight 69.6 kg, SpO2 98%.    FiO2 (%):  [28 %] 28 %   Intake/Output Summary (Last 24 hours) at 12/29/2023 1318 Last data filed at 12/29/2023 1306 Gross per 24 hour  Intake 1204 ml  Output 2950 ml  Net -1746 ml   Filed Weights   12/27/23 0453 12/28/23 0546 12/29/23 0515  Weight: 70.1 kg 70.5 kg 69.6 kg    Examination: Chronically ill Thrush on exam Lungs clear but upper airway expiratory wheezing  occasional intermittent Dry cough paroxysms noted Moves to command  Resolved Hospital Problem list     Assessment & Plan:  Acute on Chronic Hypoxemic Respiratory Failure  3L [place] at baseline  Asthma with Acute Exacerbation (on tespire as outpatient) Restrictive lung disease in setting of asbestosis with pleural plaques FVC 41% in 2015 CAP RLL rounded opacity (probable rounded atelectasis)- increasing on imaging with question of PNA raised although could be increasing pleural plaque vs. mesothelioma GERD Thrush -RVP panel negative, PCT neg  P: Abx to levaquin  x 10 days to treat sinusitis and concurrent ?CAP Magic mouthwash for thrush and throat irritation VCD possible but less likely as she has had laryngoscopy x 2 She has had SLP evaluations and MBSS without obvious aspiration Prior EGDs with dilations Steroids do not seem to change her disease trajectory much She is nearing maximal GERD, allergy, asthma, UACS tx Not much more we can do, will see if levaquin  and magic mouthwash help things along with higher dose hycodan AM CXR Will reach out to Dr Annella to see if he has any ideas  Rolan Sharps MD PCCM

## 2023-12-29 NOTE — Plan of Care (Signed)

## 2023-12-29 NOTE — Plan of Care (Signed)
   Problem: Coping: Goal: Level of anxiety will decrease Outcome: Progressing   Problem: Pain Management: Goal: General experience of comfort will improve Outcome: Progressing   Problem: Skin Integrity: Goal: Risk for impaired skin integrity will decrease Outcome: Progressing

## 2023-12-29 NOTE — Progress Notes (Signed)
 CSW completed SDOH resources and placed hard copy on pts chart.  Johnnette Gourd, MSW, LCSWA Transitions of Care [phone]

## 2023-12-29 NOTE — Progress Notes (Signed)
 Occupational Therapy Treatment Patient Details Name: Linda [name] MRN: [id number] DOB: August 20, 1946 Today's Date: 12/29/2023   History of present illness Pt is a 78 y.o. female who presents 12/25/23 with asthma exacerbation. PMH - chronic bil knee pain due to osteoarthritis, chronic low back pain, anxiety/depression, osteoporosis, chf, copd, htn, fibromyalgia, PE.   OT comments  Pt making progress with functional goals. Pt's O2 SATs 95% at rest, dropping to 84% on 3L O2 with minimal exertion/activity and required frequent rest breaks with deep, pursed lip breathing to recover >90% O2 SATs. Pt with frequent coughing. Pt very pleasant and cooperative and would continue to benefit from acute OT services to maximize level of function and safety       If plan is discharge home, recommend the following:  A little help with walking and/or transfers;A little help with bathing/dressing/bathroom;Assistance with cooking/housework;Assist for transportation   Equipment Recommendations  BSC/3in1    Recommendations for Other Services      Precautions / Restrictions Precautions Precautions: Fall Precaution Comments: 3L O2 at all times Restrictions Weight Bearing Restrictions Per Provider Order: No       Mobility Bed Mobility Overal bed mobility: Modified Independent             General bed mobility comments: No assistance needed, HOB elevated    Transfers Overall transfer level: Needs assistance Equipment used: None Transfers: Sit to/from Stand, Bed to chair/wheelchair/BSC Sit to Stand: Contact guard assist     Step pivot transfers: Supervision           Balance Overall balance assessment: Mild deficits observed, not formally tested                                         ADL either performed or assessed with clinical judgement   ADL Overall ADL's : Needs assistance/impaired     Grooming: Wash/dry hands;Wash/dry face;Supervision/safety;Standing                    Toilet Transfer: Supervision/safety;Ambulation   Toileting- Clothing Manipulation and Hygiene: Supervision/safety;Sit to/from stand         General ADL Comments: pt's O2 SATs 95% at rest, dropping to 84% with minimal exertion/activity and required frequent rest breaks with deep, pursed lip breathing to recover >90% O2 SATs. Pt with frequent coughing    Extremity/Trunk Assessment Upper Extremity Assessment Upper Extremity Assessment: Generalized weakness   Lower Extremity Assessment Lower Extremity Assessment: Defer to PT evaluation   Cervical / Trunk Assessment Cervical / Trunk Assessment: Kyphotic    Vision Baseline Vision/History: 1 Wears glasses Ability to See in Adequate Light: 0 Adequate Patient Visual Report: No change from baseline     Perception     Praxis      Cognition Arousal: Alert Behavior During Therapy: WFL for tasks assessed/performed Overall Cognitive Status: Within Functional Limits for tasks assessed                                          Exercises      Shoulder Instructions       General Comments      Pertinent Vitals/ Pain       Pain Assessment Pain Assessment: No/denies pain Pain Score: 0-No pain Pain Intervention(s): Monitored during session, Repositioned  Home Living  Prior Functioning/Environment              Frequency  Min 1X/week        Progress Toward Goals  OT Goals(current goals can now be found in the care plan section)  Progress towards OT goals: Progressing toward goals     Plan      Co-evaluation                 AM-PAC OT 6 Clicks Daily Activity     Outcome Measure   Help from another person eating meals?: None Help from another person taking care of personal grooming?: A Little Help from another person toileting, which includes using toliet, bedpan, or urinal?: A Little Help from another person  bathing (including washing, rinsing, drying)?: A Little Help from another person to put on and taking off regular upper body clothing?: A Little Help from another person to put on and taking off regular lower body clothing?: A Little 6 Click Score: 19    End of Session Equipment Utilized During Treatment: Gait belt  OT Visit Diagnosis: Muscle weakness (generalized) (M62.81);Pain   Activity Tolerance Patient limited by fatigue;Other (comment) (coughing)   Patient Left     Nurse Communication          Time: 8895-8870 OT Time Calculation (min): 25 min  Charges: OT General Charges $OT Visit: 1 Visit OT Treatments $Self Care/Home Management : 8-22 mins $Therapeutic Activity: 8-22 mins    Jacques Karna Loose 12/29/2023, 1:17 PM

## 2023-12-29 NOTE — Progress Notes (Signed)
 Mobility Specialist Progress Note:   12/29/23 0940  Mobility  Activity Ambulated with assistance in hallway  Level of Assistance Contact guard assist, steadying assist  Assistive Device Four wheel walker  Distance Ambulated (ft) 300 ft  Activity Response Tolerated well  Mobility Referral Yes  Mobility visit 1 Mobility  Mobility Specialist Start Time (ACUTE ONLY) 0940  Mobility Specialist Stop Time (ACUTE ONLY) 0955  Mobility Specialist Time Calculation (min) (ACUTE ONLY) 15 min   Pt agreeable to mobility session. Required only minG assist to ambulate safely with rollator. VSS on 3LO2. Pt back in bed with all needs met.   Therisa Rana Mobility Specialist Please contact via SecureChat or  Rehab office at [phone]

## 2023-12-29 NOTE — Consult Note (Signed)
 [facility] Liaison Consult Note    12/29/2023  Linda [name] 11-11-1946 [id number]   Insurance: Medicare ACO REACH   Primary Care Provider: Candise Aleene DEL, MD, with [place] [facility], this provider is listed for the transition of care follow up appointments  and Community TOC call    The patient was screened for hospitalization with noted high risk score for unplanned readmission risk 2 hospital admissions in 6 months.  The patient was assessed for potential Community Care Coordination service needs for post hospital transition for care coordination. Review of patient's electronic medical record reveals patient is for home with [facility].SABRA Patient resides at Independent Living facility at [facility] noted.  Plan: [facility] Liaison will continue to follow progress and disposition to asess for post hospital community care coordination/management needs.  Referral request for community care coordination: Anticipate post hospital follow up with Community TOC team.   [facility] Population Health does not replace or interfere with any arrangements made by the Inpatient Transition of Care team.   For questions contact:   Richerd Fish, RN, BSN, CCM [place]  [facility] Liaison Direct Dial: [phone] or secure chat Email: [email or web address]

## 2023-12-29 NOTE — TOC Initial Note (Signed)
 Transition of Care [facility] - Initial/Assessment Note    Patient Details  Name: Linda [name] MRN: [id number] Date of Birth: [birth year]  Transition of [facility]) CM/SW Contact:    Waddell Barnie Rama, RN Phone Number: 12/29/2023, 3:55 PM  Clinical Narrative:                 From [facility], has PCP and insurance on file, states she is currently doing PT, OT and ST onsite with [facility] she has shower chair, w/chair , and home oxygen  with adapt 3 liters at home.   States family member will transport them home at [employer] and family is support system, states gets medications from [facility].  Pta self ambulatory with walker.  NCM contacted [name] facility ad spoke with [facility] there [phone] the rep said will need the orders faxed to them at [phone]. Patient will also need a bsc, she does not have a preference of the agency, NCM made referral to Jermaine with [employer] for bsc.   Also patient chose [facility] for outpatient palliative services, NCM made referral to [facility].  Expected Discharge Plan: Home w Home Health Services Barriers to Discharge: Continued Medical Work up   Patient Goals and CMS Choice Patient states their goals for this hospitalization and ongoing recovery are:: return to IDL CMS Medicare.gov Compare Post Acute Care list provided to:: Patient Choice offered to / list presented to : Patient      Expected Discharge Plan and Services   Discharge Planning Services: CM Consult Post Acute Care Choice: Home Health Living arrangements for the past 2 months: Independent Living Facility                 DME Arranged: N/A DME Agency: NA       HH Arranged: NA          Prior Living Arrangements/Services Living arrangements for the past 2 months: Independent Living Facility Lives with:: Facility Resident Patient language and need for interpreter reviewed:: Yes Do you feel safe going back to the place where you live?: Yes      Need for Family  Participation in Patient Care: Yes (Comment) Care giver support system in place?: Yes (comment) Current home services: DME (shower chair, w/chair, oxygen  with adapt 3 liters) Criminal Activity/Legal Involvement Pertinent to Current Situation/Hospitalization: No - Comment as needed  Activities of Daily Living   ADL Screening (condition at time of admission) Independently performs ADLs?: Yes (appropriate for developmental age) Is the patient deaf or have difficulty hearing?: No Does the patient have difficulty seeing, even when wearing glasses/contacts?: No Does the patient have difficulty concentrating, remembering, or making decisions?: No  Permission Sought/Granted Permission sought to share information with : Case Manager Permission granted to share information with : Yes, Verbal Permission Granted              Emotional Assessment Appearance:: Appears stated age Attitude/Demeanor/Rapport: Engaged Affect (typically observed): Appropriate Orientation: : Oriented to Place, Oriented to  Time, Oriented to Situation, Oriented to Self Alcohol  / Substance Use: Not Applicable Psych Involvement: No (comment)  Admission diagnosis:  Severe persistent asthma with exacerbation [J45.51] Asthma exacerbation [J45.901] Patient Active Problem List   Diagnosis Date Noted   Moderate asthma with acute exacerbation 12/25/2023   Severe persistent asthma, uncomplicated 11/05/2023   Acute non-recurrent sphenoidal sinusitis 07/14/2023   Acute bacterial sinusitis 07/13/2023   Moderate persistent asthma with acute exacerbation 07/13/2023   Acute respiratory failure  with hypoxia (HCC) 07/11/2023   Subacute cough 07/10/2023   Acute on chronic hypoxic respiratory failure (HCC) 07/06/2023   Vocal cord dysfunction 07/06/2023   Thoracic vertebral fracture (HCC) 02/26/2022   Fall 02/16/2022   Maxillary fracture (HCC) 02/16/2022   Right orbital fracture (HCC) 02/16/2022   Right ureteral stone 11/30/2021    Chronic pain of left knee 10/18/2021   Spondylosis of lumbar spine 08/23/2021   Acute encephalopathy 11/24/2020   Dysphagia 06/15/2020   Allergic reaction 02/01/2020   Idiopathic angioedema 01/20/2020   Dysarthria 01/20/2020   Hypotension 10/04/2019   COPD (chronic obstructive pulmonary disease) (HCC) 01/21/2018   Pulmonary asbestosis (HCC) 01/21/2018   Hypomagnesemia 01/02/2018   Failure to thrive in adult 01/01/2018   Altered mental status 10/21/2017   Lumbar radiculitis 02/10/2017   Low serum erythropoietin  level 10/17/2016   RBBB 09/23/2016   Primary osteoarthritis of left knee 06/19/2016   Debilitated patient 06/06/2016   Chronic diastolic CHF (congestive heart failure) (HCC) 05/30/2016   Chronic respiratory failure with hypoxia (HCC) 02/07/2016   Restrictive lung disease 02/07/2016   Rotator cuff syndrome of right shoulder 01/10/2016   Encephalopathy, metabolic 01/01/2016   Fall at home 01/01/2016   Rhabdomyolysis 01/01/2016   Diastolic heart failure (HCC) 10/16/2015   CAD-minor 2014 08/16/2015   Chest pain with moderate risk for cardiac etiology 08/16/2015   Narrowing of intervertebral disc space 07/17/2015   Bilateral lower leg pain 01/24/2015   Damage to right ulnar nerve 01/16/2015   Chronic pain syndrome 11/21/2014   Anxiety 11/21/2014   Hypokalemia 11/21/2014   Hyperlipidemia 11/21/2014   Chronic pain disorder 11/21/2014   Primary osteoarthritis of right knee 10/18/2014   Pernicious anemia 08/24/2014   Generalized anxiety disorder--with occasional panic attacks.  08/05/2014   Recurrent major depression-severe (HCC) 08/05/2014   Multifactorial gait disorder 07/26/2014   Epistaxis 07/18/2014   Acute GI bleeding 07/17/2014   Anemia associated with acute blood loss 07/17/2014   Syncope 07/17/2014   History of pulmonary embolism 07/17/2014   GI bleed 07/17/2014   Arthritis 05/11/2014   DDD (degenerative disc disease) 05/11/2014   Fibrositis 05/11/2014    Amianthosis (HCC) 05/11/2014   [name] (HCC) 05/11/2014   Grief 04/28/2014   OSA (obstructive sleep apnea) 04/24/2014   Chronic heart failure with preserved ejection fraction (HFpEF) (HCC) 04/24/2014   Atelectasis 08/06/2013   Acute pulmonary embolism (HCC) 08/04/2013   Pleural plaque with presence of asbestos 07/22/2013   Polypharmacy 04/26/2013   Chronic pain 03/01/2013   Insomnia 11/12/2012   HTN (hypertension), benign 10/25/2012   PCP:  Candise Aleene DEL, MD Pharmacy:   [facility] [place], [place] - [address] [place] [address] Phone: [phone] Fax: [phone]  D[facility] - [facility] [address] [place] [address] Phone: [phone] Fax: [phone]     Social Drivers of Health (SDOH) Social History: SDOH Screenings   Food Insecurity: No Food Insecurity (12/26/2023)  Housing: Low Risk  (12/26/2023)  Transportation Needs: Unmet Transportation Needs (12/26/2023)  Utilities: Not At Risk (12/26/2023)  Alcohol  Screen: Low Risk  (01/01/2023)  Depression (PHQ2-9): Low Risk  (10/07/2023)  Recent Concern: Depression (PHQ2-9) - Medium Risk (08/05/2023)  Financial Resource Strain: Low Risk  (01/01/2023)  Physical Activity: Insufficiently Active (01/01/2023)  Social Connections: Moderately Isolated (12/26/2023)  Stress: No Stress Concern Present (01/01/2023)  Tobacco Use: Low Risk  (12/25/2023)   SDOH Interventions:     Readmission  Risk Interventions    07/16/2023    9:36 AM  Readmission Risk Prevention Plan  Transportation Screening Complete  Medication Review (RN Care Manager) Complete  PCP or Specialist appointment within 3-5 days of discharge Complete  HRI or Home Care Consult Complete  SW Recovery Care/Counseling Consult Complete  Palliative Care Screening Not Applicable  Skilled Nursing Facility Not Applicable

## 2023-12-30 ENCOUNTER — Telehealth: Payer: Self-pay

## 2023-12-30 ENCOUNTER — Inpatient Hospital Stay (HOSPITAL_COMMUNITY): Payer: Medicare Other

## 2023-12-30 DIAGNOSIS — J4551 Severe persistent asthma with (acute) exacerbation: Secondary | ICD-10-CM | POA: Diagnosis not present

## 2023-12-30 LAB — LEGIONELLA PNEUMOPHILA SEROGP 1 UR AG: L. pneumophila Serogp 1 Ur Ag: NEGATIVE

## 2023-12-30 LAB — COMPREHENSIVE METABOLIC PANEL
ALT: 22 U/L (ref 0–44)
AST: 19 U/L (ref 15–41)
Albumin: 2.5 g/dL — ABNORMAL LOW (ref 3.5–5.0)
Alkaline Phosphatase: 54 U/L (ref 38–126)
Anion gap: 11 (ref 5–15)
BUN: 23 mg/dL (ref 8–23)
CO2: 30 mmol/L (ref 22–32)
Calcium: 8.5 mg/dL — ABNORMAL LOW (ref 8.9–10.3)
Chloride: 96 mmol/L — ABNORMAL LOW (ref 98–111)
Creatinine, Ser: 0.94 mg/dL (ref 0.44–1.00)
GFR, Estimated: >60 mL/min (ref >60)
Glucose, Bld: 163 mg/dL — ABNORMAL HIGH (ref 70–99)
Potassium: 3 mmol/L — ABNORMAL LOW (ref 3.5–5.1)
Sodium: 137 mmol/L (ref 135–145)
Total Bilirubin: 0.5 mg/dL (ref 0.0–1.2)
Total Protein: 6.1 g/dL — ABNORMAL LOW (ref 6.5–8.1)

## 2023-12-30 MED ORDER — TRAZODONE HCL 100 MG PO TABS
300.0000 mg | ORAL_TABLET | Freq: Every evening | ORAL | Status: DC | PRN
  Administered 2023-12-30 – 2024-01-02 (×3): 300 mg via ORAL
  Filled 2023-12-30 (×3): qty 3

## 2023-12-30 MED ORDER — ALPRAZOLAM 0.5 MG PO TABS
0.5000 mg | ORAL_TABLET | Freq: Three times a day (TID) | ORAL | Status: DC | PRN
  Administered 2023-12-31: 0.5 mg via ORAL
  Filled 2023-12-30: qty 1

## 2023-12-30 MED ORDER — POTASSIUM CHLORIDE CRYS ER 20 MEQ PO TBCR
40.0000 meq | EXTENDED_RELEASE_TABLET | Freq: Once | ORAL | Status: AC
  Administered 2023-12-30: 40 meq via ORAL
  Filled 2023-12-30: qty 2

## 2023-12-30 NOTE — Progress Notes (Signed)
   This pt has been referred to our Care Connection program. This is a home-based Palliative care program that is provided by [facility]. We will follow the pt at home after discharge to assist with any chronic/acute symptom management needs related to their chronic illness. Our provider will see all pt's for Palliative care consults with ongoing nursing case management and SW visit/telehealth support. The pt will also have after-hours nursing support as well. With this program the pt is eligible for other [facility] services at home with our program in place.  Magdalena Berber, [phone]

## 2023-12-30 NOTE — Progress Notes (Signed)
   NAME:  Linda [name], MRN:  [id number], DOB:  1946-05-16, LOS: 5 ADMISSION DATE:  12/25/2023, CONSULTATION DATE:  12/25/23 REFERRING MD:  Dr. Silvester, MD CHIEF COMPLAINT:  asthma exacerbation   History of Present Illness:  Linda [name] is a 78 year old woman with history of restrictive lung disease and chronic hypoxemic respiratory failure in setting of asbesosis with pleural plaques along with asthma who presented to ER with wheezing and dyspnea.   PCCM consulted for asthma exacerbation. She has been given duoneb treatment and steroids. She reports feeling better already but continues to have some work of breathing.   She is baseline on 3L of O2, currently on 4-5L in ER. She denies any sick contacts. Reports some chills denies fevers. She does have cough with sputum production.   She is on 5mg  prednisone  daily.  Pertinent  Medical History  CAD CHF Chronic Back Pain [name] with pleural Plaques Chronic Hypoxemic Respiratory Failure  Significant Hospital Events: Including procedures, antibiotic start and stop dates in addition to other pertinent events   1/2 Admitted to [facility] for asthma exacerbation 1/3 Mild improvement. On baseline O2 with adequate saturation. Still wheezy. 1/4 Reports some improvement in wheezing this am but not back to baseline yet.   Interim History / Subjective:  Feels a little better today. Stable O2 needs but desats with activity.  Objective   Blood pressure 131/72, pulse 68, temperature 97.9 F (36.6 C), temperature source Oral, resp. rate 20, height 5' 4 (1.626 m), weight 69.6 kg, SpO2 93%.    FiO2 (%):  [32 %] 32 %   Intake/Output Summary (Last 24 hours) at 12/30/2023 1009 Last data filed at 12/30/2023 9093 Gross per 24 hour  Intake 1227 ml  Output 1700 ml  Net -473 ml   Filed Weights   12/27/23 0453 12/28/23 0546 12/29/23 0515  Weight: 70.1 kg 70.5 kg 69.6 kg    Examination: No distress Improved thrush Lungs clear, still coughs with  deep breaths Ext warm  K repleted CXR ok/stable Resolved Hospital Problem list     Assessment & Plan:  Acute on Chronic Hypoxemic Respiratory Failure  3L [place] at baseline  Asthma with Acute Exacerbation (on tespire as outpatient) Restrictive lung disease in setting of asbestosis with pleural plaques FVC 41% in 2015 CAP RLL rounded opacity (probable rounded atelectasis)- increasing on imaging with question of PNA raised although could be increasing pleural plaque vs. mesothelioma GERD Thrush -RVP panel negative, PCT neg VCD possible but less likely as she has had laryngoscopy x 2 She has had SLP evaluations and MBSS without obvious aspiration Prior EGDs with dilations Steroids do not seem to change her disease trajectory much She is nearing maximal GERD, allergy, asthma, UACS tx Not much more we can do, will see if levaquin  and magic mouthwash help things along with higher dose hycodan  P: levaquin  x 10 days to treat sinusitis and concurrent ?CAP Magic mouthwash for thrush and throat irritation Awaiting Dr. Leatha input, I think right now let's just wait and see how above interventions work with her symptoms Will follow  Rolan Sharps MD PCCM

## 2023-12-30 NOTE — Telephone Encounter (Signed)
 Auth Submission: NO AUTH NEEDED Site of care: Site of care: [facility] Payer: Medicare A/B with BCBS supplement Medication & CPT/J Code(s) submitted: Tezspire  (Tezepelumab ) J2356 Route of submission (phone, fax, portal):  Phone # Fax # Auth type: Buy/Bill PB Units/visits requested: 210mg  x 12 doses Reference number:  Approval from: 12/30/23 to 12/22/24   Medicare A/B will cover 80%, BCBS will cover the remaining 20%.

## 2023-12-30 NOTE — Plan of Care (Signed)

## 2023-12-30 NOTE — Plan of Care (Signed)
  Problem: Clinical Measurements: Goal: Cardiovascular complication will be avoided Outcome: Progressing   Problem: Activity: Goal: Risk for activity intolerance will decrease Outcome: Progressing   Problem: Nutrition: Goal: Adequate nutrition will be maintained Outcome: Progressing   Problem: Elimination: Goal: Will not experience complications related to urinary retention Outcome: Progressing   Problem: Safety: Goal: Ability to remain free from injury will improve Outcome: Progressing   Problem: Skin Integrity: Goal: Risk for impaired skin integrity will decrease Outcome: Progressing

## 2023-12-30 NOTE — Hospital Course (Addendum)
 Linda [name] is a 78 y.o. female with a history of exposure, diastolic heart failure, chronic chronic respiratory failure, restrictive lung disease.  Patient presented secondary to shortness of breath and was found to have evidence of an asthma exacerbation.  Patient started on Solu-Medrol  in addition to azithromycin .  Imaging with suggestion of possible pneumonia and patient was covered with ceftriaxone  and azithromycin  combination.  Pulmonology was consulted secondary to lung disease.  Patient with slow improvement in respiratory functional capacity.

## 2023-12-30 NOTE — Progress Notes (Signed)
 PROGRESS NOTE    Linda [name]  FMW:[id number] DOB: 01/11/1946 DOA: 12/25/2023 PCP: Candise Aleene DEL, MD   Brief Narrative: Linda [name] is a 78 y.o. female with a history of exposure, diastolic heart failure, chronic chronic respiratory failure, restrictive lung disease.  Patient presented secondary to shortness of breath and was found to have evidence of an asthma exacerbation.  Patient started on Solu-Medrol  in addition to azithromycin .  Imaging with suggestion of possible pneumonia and patient was covered with ceftriaxone  and azithromycin  combination.  Pulmonology was consulted secondary to lung disease.  Patient with slow improvement in respiratory functional capacity.   Assessment and Plan:  Asthma exacerbation Restrictive lung disease in setting of asbestosis with pleural plaques RVP negative. COVID-19, influenza and RSV negative. Procalcitonin negative. Patient with wheezing and dyspnea. Pulmonology consulted secondary to patient's extensive lung disease. Patient treated with and completed a 5 day course of Solu-Medrol  transitioned to prednisone . -Continue DuoNebs PRN -Continue Brovana , Pulmicort  and Yupelri   Chronic respiratory failure with hypoxia Patient uses 3 L/min via nasal canula at baseline. It does not appear patient has required higher than her baseline oxygen  use.  Possible community acquired pneumonia CT chest significant for increasing rounded airspace disease concerning for possible pneumonia. Patient started empirically on antibiotics for treatment. -Continue Levaquin  per pulmonology  Acute sinusitis -Continue Levaquin  with plan to treat for 10 days  Anxiety -Continue Xanax ; increased frequency to home dose of 0.5 mg TID  CAD Hyperlipidemia Patient is on aspirin , Crestor  and Imdur  as an outpatient. Aspirin  held. -Continue Imdur  and Crestor   Chronic diastolic heart failure Euvolemic. -Continue Lasix  IV  GERD -Continue Pepcid   Primary  hypertension -Continue amlodipine  and Imdur   Chronic pain syndrome -Continue oxycodone    DVT prophylaxis: Lovenox  Code Status:   Code Status: Limited: Do not attempt resuscitation (DNR) -DNR-LIMITED -Do Not Intubate/DNI  Family Communication: None at bedside Disposition Plan: Discharge home with HHPT/OT pending ongoing pulmonology recommendations   Consultants:  Pulmonology  Procedures:  Transthoracic echo  Antimicrobials: Ceftriaxone  Azithromycin  Levaquin     Subjective: Patient reports anxiety.  Patient reports that she wants to be increased to Xanax  1 mg 3 times daily.  She states that this is what she was taking as an outpatient from her primary care doctor.  When reviewed on PDMP, patient has been on Xanax  0.5 mg 3 times daily as needed since 2023.  Otherwise, ongoing dyspnea.  No chest pain.  Objective: BP 136/71 (BP Location: Left Arm)   Pulse 74   Temp 98 F (36.7 C) (Oral)   Resp 19   Ht 5' 4 (1.626 m)   Wt 69.6 kg   SpO2 98%   BMI 26.35 kg/m   Examination:  General exam: Appears calm and comfortable Respiratory system: Clear. Respiratory effort normal. Cardiovascular system: S1 & S2 heard, RRR. Gastrointestinal system: Abdomen is nondistended, soft and nontender. Normal bowel sounds heard. Central nervous system: Alert and oriented. No focal neurological deficits. Musculoskeletal: No edema. No calf tenderness Psychiatry: Judgement and insight appear normal. Mood & affect appropriate.    Data Reviewed: I have personally reviewed following labs and imaging studies  CBC Lab Results  Component Value Date   WBC 8.6 12/26/2023   RBC 3.74 (L) 12/26/2023   HGB 10.4 (L) 12/26/2023   HCT 33.0 (L) 12/26/2023   MCV 88.2 12/26/2023   MCH 27.8 12/26/2023   PLT 266 12/26/2023   MCHC 31.5 12/26/2023   RDW 12.7 12/26/2023   LYMPHSABS 2.5 08/19/2023   MONOABS  0.9 08/19/2023   EOSABS 0.1 08/19/2023   BASOSABS 0.0 08/19/2023     Last metabolic panel Lab  Results  Component Value Date   NA 137 12/30/2023   K 3.0 (L) 12/30/2023   CL 96 (L) 12/30/2023   CO2 30 12/30/2023   BUN 23 12/30/2023   CREATININE 0.94 12/30/2023   GLUCOSE 163 (H) 12/30/2023   GFRNONAA >60 12/30/2023   GFRAA >60 06/18/2020   CALCIUM  8.5 (L) 12/30/2023   PHOS 3.5 12/29/2023   PROT 6.1 (L) 12/30/2023   ALBUMIN 2.5 (L) 12/30/2023   BILITOT 0.5 12/30/2023   ALKPHOS 54 12/30/2023   AST 19 12/30/2023   ALT 22 12/30/2023   ANIONGAP 11 12/30/2023    GFR: Estimated Creatinine Clearance: 48 mL/min (by C-G formula based on SCr of 0.94 mg/dL).  Recent Results (from the past 240 hours)  Resp panel by RT-PCR (RSV, Flu A&B, Covid) Anterior Nasal Swab     Status: None   Collection Time: 12/25/23  2:57 PM   Specimen: Anterior Nasal Swab  Result Value Ref Range Status   SARS Coronavirus 2 by RT PCR NEGATIVE NEGATIVE Final   Influenza A by PCR NEGATIVE NEGATIVE Final   Influenza B by PCR NEGATIVE NEGATIVE Final    Comment: (NOTE) The Xpert Xpress SARS-CoV-2/FLU/RSV plus assay is intended as an aid in the diagnosis of influenza from Nasopharyngeal swab specimens and should not be used as a sole basis for treatment. Nasal washings and aspirates are unacceptable for Xpert Xpress SARS-CoV-2/FLU/RSV testing.  Fact Sheet for Patients: bloggercourse.com  Fact Sheet for Healthcare Providers: seriousbroker.it  This test is not yet approved or cleared by the [place]  FDA and has been authorized for detection and/or diagnosis of SARS-CoV-2 by FDA under an Emergency Use Authorization (EUA). This EUA will remain in effect (meaning this test can be used) for the duration of the COVID-19 declaration under Section 564(b)(1) of the Act, 21 U.S.C. section 360bbb-3(b)(1), unless the authorization is terminated or revoked.     Resp Syncytial Virus by PCR NEGATIVE NEGATIVE Final    Comment: (NOTE) Fact Sheet for  Patients: bloggercourse.com  Fact Sheet for Healthcare Providers: seriousbroker.it  This test is not yet approved or cleared by the [place]  FDA and has been authorized for detection and/or diagnosis of SARS-CoV-2 by FDA under an Emergency Use Authorization (EUA). This EUA will remain in effect (meaning this test can be used) for the duration of the COVID-19 declaration under Section 564(b)(1) of the Act, 21 U.S.C. section 360bbb-3(b)(1), unless the authorization is terminated or revoked.  Performed at [facility], [address] [place], [place] [address]   Respiratory (~20 pathogens) panel by PCR     Status: None   Collection Time: 12/25/23  8:27 PM   Specimen: Nasopharyngeal Swab; Respiratory  Result Value Ref Range Status   Adenovirus NOT DETECTED NOT DETECTED Final   Coronavirus 229E NOT DETECTED NOT DETECTED Final    Comment: (NOTE) The Coronavirus on the Respiratory Panel, DOES NOT test for the novel  Coronavirus (2019 nCoV)    Coronavirus HKU1 NOT DETECTED NOT DETECTED Final   Coronavirus NL63 NOT DETECTED NOT DETECTED Final   Coronavirus OC43 NOT DETECTED NOT DETECTED Final   Metapneumovirus NOT DETECTED NOT DETECTED Final   Rhinovirus / Enterovirus NOT DETECTED NOT DETECTED Final   Influenza A NOT DETECTED NOT DETECTED Final   Influenza B NOT DETECTED NOT DETECTED Final   Parainfluenza Virus 1 NOT DETECTED NOT DETECTED  Final   Parainfluenza Virus 2 NOT DETECTED NOT DETECTED Final   Parainfluenza Virus 3 NOT DETECTED NOT DETECTED Final   Parainfluenza Virus 4 NOT DETECTED NOT DETECTED Final   Respiratory Syncytial Virus NOT DETECTED NOT DETECTED Final   Bordetella pertussis NOT DETECTED NOT DETECTED Final   Bordetella Parapertussis NOT DETECTED NOT DETECTED Final   Chlamydophila pneumoniae NOT DETECTED NOT DETECTED Final   Mycoplasma pneumoniae NOT DETECTED NOT DETECTED Final    Comment: Performed at  [facility], [address] [place], [place] [address]  MRSA Next Gen by PCR, Nasal     Status: None   Collection Time: 12/25/23 11:15 PM   Specimen: Nasal Mucosa; Nasal Swab  Result Value Ref Range Status   MRSA by PCR Next Gen NOT DETECTED NOT DETECTED Final    Comment: (NOTE) The GeneXpert MRSA Assay (FDA approved for NASAL specimens only), is one component of a comprehensive MRSA colonization surveillance program. It is not intended to diagnose MRSA infection nor to guide or monitor treatment for MRSA infections. Test performance is not FDA approved in patients less than 87 years old. Performed at [facility], [address] [place], [place] [address]       Radiology Studies: DG Chest Port 1 View Result Date: 12/30/2023 CLINICAL DATA:  Pneumonia. EXAM: PORTABLE CHEST 1 VIEW COMPARISON:  December 25, 2023. FINDINGS: Stable cardiomediastinal silhouette. Stable bilateral calcified pleural plaques. No acute pulmonary disease. Bony thorax is unremarkable. IMPRESSION: Bilateral calcified pleural plaques consistent with asbestos exposure. No acute pulmonary disease. Electronically Signed   By: Lynwood Landy Raddle M.D.   On: 12/30/2023 10:09      LOS: 5 days    Elgin Lam, MD [facility] 12/30/2023, 12:17 PM   If 7PM-7AM, please contact night-coverage [email or web address]

## 2023-12-31 DIAGNOSIS — J4551 Severe persistent asthma with (acute) exacerbation: Secondary | ICD-10-CM | POA: Diagnosis not present

## 2023-12-31 DIAGNOSIS — J9621 Acute and chronic respiratory failure with hypoxia: Secondary | ICD-10-CM | POA: Diagnosis not present

## 2023-12-31 LAB — ALLERGENS W/TOTAL IGE AREA 2
Alternaria Alternata IgE: <0.1 kU/L
Aspergillus Fumigatus IgE: <0.1 kU/L
Bermuda Grass IgE: <0.1 kU/L
Cat Dander IgE: <0.1 kU/L
Cedar, Mountain IgE: <0.1 kU/L
Cladosporium Herbarum IgE: <0.1 kU/L
Cockroach, German IgE: <0.1 kU/L
Common Silver Birch IgE: <0.1 kU/L
Cottonwood IgE: <0.1 kU/L
D Farinae IgE: <0.1 kU/L
D Pteronyssinus IgE: <0.1 kU/L
Dog Dander IgE: <0.1 kU/L
Elm, American IgE: <0.1 kU/L
IgE (Immunoglobulin E), Serum: 434 [IU]/mL (ref 6–495)
Johnson Grass IgE: <0.1 kU/L
Maple/Box Elder IgE: <0.1 kU/L
Mouse Urine IgE: <0.1 kU/L
Oak, White IgE: <0.1 kU/L
Pecan, Hickory IgE: <0.1 kU/L
Penicillium Chrysogen IgE: <0.1 kU/L
Pigweed, Rough IgE: <0.1 kU/L
Ragweed, Short IgE: <0.1 kU/L
Sheep Sorrel IgE Qn: <0.1 kU/L
Timothy Grass IgE: <0.1 kU/L
White Mulberry IgE: <0.1 kU/L

## 2023-12-31 LAB — COMPREHENSIVE METABOLIC PANEL
ALT: 21 U/L (ref 0–44)
AST: 20 U/L (ref 15–41)
Albumin: 2.6 g/dL — ABNORMAL LOW (ref 3.5–5.0)
Alkaline Phosphatase: 46 U/L (ref 38–126)
Anion gap: 9 (ref 5–15)
BUN: 25 mg/dL — ABNORMAL HIGH (ref 8–23)
CO2: 28 mmol/L (ref 22–32)
Calcium: 8.3 mg/dL — ABNORMAL LOW (ref 8.9–10.3)
Chloride: 98 mmol/L (ref 98–111)
Creatinine, Ser: 0.93 mg/dL (ref 0.44–1.00)
GFR, Estimated: >60 mL/min (ref >60)
Glucose, Bld: 135 mg/dL — ABNORMAL HIGH (ref 70–99)
Potassium: 3.6 mmol/L (ref 3.5–5.1)
Sodium: 135 mmol/L (ref 135–145)
Total Bilirubin: 0.5 mg/dL (ref 0.0–1.2)
Total Protein: 6 g/dL — ABNORMAL LOW (ref 6.5–8.1)

## 2023-12-31 LAB — CBC
HCT: 39.6 % (ref 36.0–46.0)
Hemoglobin: 12.7 g/dL (ref 12.0–15.0)
MCH: 27.7 pg (ref 26.0–34.0)
MCHC: 32.1 g/dL (ref 30.0–36.0)
MCV: 86.5 fL (ref 80.0–100.0)
Platelets: 316 10*3/uL (ref 150–400)
RBC: 4.58 MIL/uL (ref 3.87–5.11)
RDW: 12.8 % (ref 11.5–15.5)
WBC: 11.9 10*3/uL — ABNORMAL HIGH (ref 4.0–10.5)
nRBC: 0 % (ref 0.0–0.2)

## 2023-12-31 MED ORDER — POTASSIUM CHLORIDE CRYS ER 20 MEQ PO TBCR
40.0000 meq | EXTENDED_RELEASE_TABLET | ORAL | Status: AC
  Administered 2023-12-31 (×2): 40 meq via ORAL
  Filled 2023-12-31 (×2): qty 2

## 2023-12-31 MED ORDER — FUROSEMIDE 40 MG PO TABS
40.0000 mg | ORAL_TABLET | Freq: Every day | ORAL | Status: DC
  Administered 2024-01-01: 40 mg via ORAL
  Filled 2023-12-31: qty 1

## 2023-12-31 MED ORDER — GABAPENTIN 400 MG PO CAPS
400.0000 mg | ORAL_CAPSULE | Freq: Three times a day (TID) | ORAL | Status: DC
  Administered 2023-12-31 – 2024-01-09 (×27): 400 mg via ORAL
  Filled 2023-12-31 (×28): qty 1

## 2023-12-31 NOTE — Progress Notes (Signed)
   NAME:  Linda [name], MRN:  [id number], DOB:  [birth year], LOS: 6 ADMISSION DATE:  12/25/2023, CONSULTATION DATE:  12/25/23 REFERRING MD:  Dr. Silvester, MD CHIEF COMPLAINT:  asthma exacerbation   History of Present Illness:  Linda [name] is a 78 year old woman with history of restrictive lung disease and chronic hypoxemic respiratory failure in setting of asbesosis with pleural plaques along with asthma who presented to ER with wheezing and dyspnea.   PCCM consulted for asthma exacerbation. She has been given duoneb treatment and steroids. She reports feeling better already but continues to have some work of breathing.   She is baseline on 3L of O2, currently on 4-5L in ER. She denies any sick contacts. Reports some chills denies fevers. She does have cough with sputum production.   She is on 5mg  prednisone  daily.  Pertinent  Medical History  CAD CHF Chronic Back Pain [name] with pleural Plaques Chronic Hypoxemic Respiratory Failure  Significant Hospital Events: Including procedures, antibiotic start and stop dates in addition to other pertinent events   1/2 Admitted to [facility] for asthma exacerbation 1/3 Mild improvement. On baseline O2 with adequate saturation. Still wheezy. 1/4 Reports some improvement in wheezing this am but not back to baseline yet.   Interim History / Subjective:  Intermittent coughing fit and associated dyspnea. Overall becoming less frequent.  Objective   Blood pressure 121/69, pulse 61, temperature 97.6 F (36.4 C), temperature source Oral, resp. rate 20, height 5' 4 (1.626 m), weight 69.6 kg, SpO2 94%.        Intake/Output Summary (Last 24 hours) at 12/31/2023 0747 Last data filed at 12/31/2023 0330 Gross per 24 hour  Intake 600 ml  Output 900 ml  Net -300 ml   Filed Weights   12/27/23 0453 12/28/23 0546 12/29/23 0515  Weight: 70.1 kg 70.5 kg 69.6 kg    Examination: No distress No thrush seen Moves to command RASS 0 Aox3 Lungs pretty  clear +arthritic changes of hands  Resolved Hospital Problem list     Assessment & Plan:  Acute on Chronic Hypoxemic Respiratory Failure  3L Bells at baseline  Asthma with Acute Exacerbation (on tespire as outpatient) Restrictive lung disease in setting of asbestosis with pleural plaques FVC 41% in 2015 CAP RLL rounded opacity (probable rounded atelectasis)- increasing on imaging with question of PNA raised although could be increasing pleural plaque vs. mesothelioma GERD Thrush -RVP panel negative, PCT neg VCD possible but less likely as she has had laryngoscopy x 2 She has had SLP evaluations and MBSS without obvious aspiration Prior EGDs with dilations Steroids do not seem to change her disease trajectory much She is nearing maximal GERD, allergy, asthma, UACS tx Not much more we can do, will see if levaquin  and magic mouthwash help things along with higher dose hycodan  P: levaquin  x 10 days to treat sinusitis and concurrent ?CAP Magic mouthwash for thrush and throat irritation Maximal asthma treatment as ordered sans prednisone  Increase gabapentin  to help cough suppression, suspect some functional cough overlay Will follow; she will probably tell us  when she's comfortable to go home otherwise suspect she will be immediately readmitted due to fragile baseline pulmonary status  Rolan Sharps MD PCCM

## 2023-12-31 NOTE — Progress Notes (Signed)
 PROGRESS NOTE  Linda [name]  DOB: [birth year]  PCP: Candise Aleene DEL, MD FMW:[id number]  DOA: 12/25/2023  LOS: 6 days  Hospital Day: 7  Brief narrative: Linda [name] is a 78 y.o. female with PMH significant for HTN, HLD, CHF, chronically on prednisone  and 3 L oxygen  at home due to restrictive lung disease from last month and asbesosis with pleural plaques, esophageal stricture s/p prior dilatation 1/2, patient presented to the ED with complaint of shortness of breath, wheezing, productive cough Found to be in asthma exacerbation CT chest showed extensive bilateral calcified pleural plaques, also showed increasing airspace disease in the medial right lower lobe suspicious for pneumonia RVP negative. COVID-19, influenza and RSV negative.  Procalcitonin negative.  Started on IV Solu-Medrol , azithromycin , bronchodilators as well as empiric antibiotics PCCM consulted 1/5, CT sinus showed findings compatible with acute sinusitis involving bilateral maxillary and sphenoid sinus fluid  Subjective: Patient was seen and examined this morning.  Elderly Caucasian female.  Lying on bed. Complains of persistent shortness of breath and cough.  On 3 L oxygen  by nasal color currently Chart reviewed In the last 24 hours, no fever, heart rate in 60s and 70s, blood pressure in low 100s, currently breathing on 3 L oxygen  by nasal cannula Labs from this morning with WBC count 11.9,  Assessment and plan: Acute on chronic hypoxic respiratory failure Acute exacerbation of asthma Acute sinusitis Restrictive lung disease in setting of asbestosis with pleural plaques Presented with worsening shortness of breath in the setting of pre-existing asthma, asbestosis. Admitted for asthma exacerbation.  Started on steroids. There was initial suspicion of pneumonia as well and hence she was started on Rocephin  and azithromycin . 1/5, CT sinus showed findings compatible with acute sinusitis involving  bilateral maxillary and sphenoid sinus fluid Pulmonary consultation appreciated.  Per pulmonology note, VCD possible but less likely as he has had laryngoscopy x 2.  Recent SLP evaluations with MBS did not show obvious aspiration. Patient seems to be nearing maximal treatment aimed at asthma, GERD, allergy and UACS. PCCM recommended an empiric 10-day course of Levaquin  to treat sinusitis, to complete on 1/16. Continue Magic mouthwash for thrush and throat irritation. Continue gabapentin  to help with cough suppression Continue bronchodilators, Claritin , Singulair  For asthma, patient was also using Tezspire  subcu q28 days  CAD HLD PTA meds- aspirin  81 mg daily,, Crestor  and Imdur   Continue Imdur  and Crestor .  Currently aspirin  is on hold.   Chronic diastolic heart failure Hypertension Euvolemic. PTA meds-Toprol  100 mg daily, amlodipine  5 mg daily, Imdur  30 mg twice daily, Lasix  20 mg daily Currently continued on Toprol ,, amlodipine , Imdur .  Lasix  is 40 mg IV. Blood pressure low normal range.  BUN is trending up.  Switch Lasix  to oral 40 mg daily today   GERD Continue PPI    Anxiety depression Continue Xanax  0.5 mg TID, Cymbalta  60 mg daily, Lamictal  200 mg daily   Chronic pain syndrome Continue oxycodone   Restless legs Requip    Mobility: Encourage ambulation  Goals of care   Code Status: Limited: Do not attempt resuscitation (DNR) -DNR-LIMITED -Do Not Intubate/DNI      DVT prophylaxis:  enoxaparin  (LOVENOX ) injection 40 mg Start: 12/26/23 1930 SCDs Start: 12/25/23 2315   Antimicrobials: Levaquin  Fluid: None Consultants: Pulmonary Family Communication: None at bedside  Status: Inpatient Level of care:  Progressive   Patient is from: Home Needs to continue in-hospital care: Remains short of breath, continues to have cough Anticipated d/c to: Pending clinical course  Diet:  Diet Order             Diet regular Room service appropriate? Yes; Fluid  consistency: Thin  Diet effective now                   Scheduled Meds:  acetaminophen   1,000 mg Oral Once   amLODipine   5 mg Oral Daily   arformoterol   15 mcg Nebulization BID   budesonide  (PULMICORT ) nebulizer solution  0.25 mg Nebulization BID   DULoxetine   60 mg Oral Daily   enoxaparin  (LOVENOX ) injection  40 mg Subcutaneous Q24H   famotidine   20 mg Oral QHS   feeding supplement  237 mL Oral BID BM   fluticasone   2 spray Each Nare Daily   [START ON 01/01/2024] furosemide   40 mg Oral Daily   gabapentin   400 mg Oral TID   guaiFENesin   600 mg Oral BID   guaiFENesin   10 mL Oral Q6H   isosorbide  mononitrate  30 mg Oral BID   lamoTRIgine   200 mg Oral Daily   levofloxacin   500 mg Oral Daily   loratadine   10 mg Oral Daily   magic mouthwash w/lidocaine   5 mL Oral TID   metoprolol  succinate  100 mg Oral Daily   montelukast   10 mg Oral QHS   multivitamin with minerals  1 tablet Oral Daily   pantoprazole   40 mg Oral BID   potassium chloride   40 mEq Oral Q4H   revefenacin   175 mcg Nebulization Daily   sodium chloride  flush  3 mL Intravenous Q12H    PRN meds: acetaminophen  **OR** acetaminophen , ALPRAZolam , cyclobenzaprine , HYDROcodone  bit-homatropine, ipratropium-albuterol , ondansetron  **OR** ondansetron  (ZOFRAN ) IV, oxyCODONE -acetaminophen  **AND** oxyCODONE , rOPINIRole , sodium chloride  flush, traZODone    Infusions:    Antimicrobials: Anti-infectives (From admission, onward)    Start     Dose/Rate Route Frequency Ordered Stop   12/29/23 1345  levofloxacin  (LEVAQUIN ) tablet 500 mg        500 mg Oral Daily 12/29/23 1322 01/08/24 1159   12/28/23 1400  cefTRIAXone  (ROCEPHIN ) 1 g in sodium chloride  0.9 % 100 mL IVPB  Status:  Discontinued        1 g 200 mL/hr over 30 Minutes Intravenous Every 24 hours 12/28/23 1300 12/29/23 1322   12/26/23 2315  azithromycin  (ZITHROMAX ) tablet 500 mg  Status:  Discontinued       Placed in Followed by Linked Group   500 mg Oral Daily 12/25/23  2315 12/25/23 2320   12/26/23 2200  azithromycin  (ZITHROMAX ) tablet 500 mg  Status:  Discontinued        500 mg Oral Daily 12/25/23 2321 12/25/23 2322   12/26/23 2200  azithromycin  (ZITHROMAX ) tablet 500 mg  Status:  Discontinued        500 mg Oral Daily 12/25/23 2322 12/28/23 1300   12/25/23 2314  azithromycin  (ZITHROMAX ) 500 mg in sodium chloride  0.9 % 250 mL IVPB  Status:  Discontinued       Placed in Followed by Linked Group   500 mg 250 mL/hr over 60 Minutes Intravenous Every 24 hours 12/25/23 2315 12/25/23 2320   12/25/23 2030  cefTRIAXone  (ROCEPHIN ) 2 g in sodium chloride  0.9 % 100 mL IVPB  Status:  Discontinued        2 g 200 mL/hr over 30 Minutes Intravenous Every 24 hours 12/25/23 2027 12/27/23 1727   12/25/23 2030  azithromycin  (ZITHROMAX ) 500 mg in sodium chloride  0.9 % 250 mL IVPB  Status:  Discontinued  500 mg 250 mL/hr over 60 Minutes Intravenous Every 24 hours 12/25/23 2027 12/27/23 1108       Objective: Vitals:   12/31/23 0901 12/31/23 1313  BP: 118/60 115/70  Pulse:  75  Resp: 20   Temp:    SpO2:  96%    Intake/Output Summary (Last 24 hours) at 12/31/2023 1340 Last data filed at 12/31/2023 1223 Gross per 24 hour  Intake 280 ml  Output 1700 ml  Net -1420 ml   Filed Weights   12/27/23 0453 12/28/23 0546 12/29/23 0515  Weight: 70.1 kg 70.5 kg 69.6 kg   Weight change:  Body mass index is 26.35 kg/m.   Physical Exam: General exam: Pleasant, elderly Skin: No rashes, lesions or ulcers. HEENT: Atraumatic, normocephalic, no obvious bleeding Lungs: Clear to auscultation bilaterally,  CVS: S1, S2, no murmur,   GI/Abd: Soft, nontender, nondistended, bowel sound present,   CNS: Alert, awake, oriented x 3 Psychiatry: Mood appropriate,  Extremities: No pedal edema, no calf tenderness, arthritic changes in hand  Data Review: I have personally reviewed the laboratory data and studies available.  F/u labs  Unresulted Labs (From admission, onward)      Start     Ordered   12/27/23 1112  Comprehensive metabolic panel  Daily,   R      12/27/23 1111   12/25/23 2315  Expectorated Sputum Assessment w Gram Stain, Rflx to Resp Cult  (COPD / Pneumonia / Cellulitis / Lower Extremity Wound)  Once,   R        12/25/23 2315            Total time spent in review of labs and imaging, patient evaluation, formulation of plan, documentation and communication with family: 55 minutes  Signed, Chapman Rota, MD [facility] 12/31/2023

## 2023-12-31 NOTE — Progress Notes (Signed)
 Physical Therapy Treatment Patient Details Name: Linda [name] MRN: [id number] DOB: February 16, 1946 Today's Date: 12/31/2023   History of Present Illness Pt is a 78 y.o. female who presents 12/25/23 with asthma exacerbation. PMH - chronic bil knee pain due to osteoarthritis, chronic low back pain, anxiety/depression, osteoporosis, chf, copd, htn, fibromyalgia, PE.    PT Comments  Patient resting in bed and reported soiled with urine and needing assistance to clean. Pt eager to mobilize with therapy following hygiene. Time spent at EOB for changing brief and pad and for pericare with Mod-Max assist as pt required bil UE support on Rollator in standing. Pt completed bed mobility at Mod Ind level and sit<>stands with supervision from EOB. Pt ambulated with rollator demonstrating safe management of walker and utilized rollator seat for rest break at ~200' with supervision and cues for safety. EOS pt agreeable to remain OOB and completed 1x10 reps bil LE LAQ and encouraged to perform ankle pumps for circulation. Will continue to progress pt as able during acute stay.     If plan is discharge home, recommend the following: Assistance with cooking/housework;Assist for transportation   Can travel by private vehicle        Equipment Recommendations  None recommended by PT    Recommendations for Other Services       Precautions / Restrictions Precautions Precautions: Fall Precaution Comments: 3L O2 at all times Restrictions Weight Bearing Restrictions Per Provider Order: No     Mobility  Bed Mobility Overal bed mobility: Modified Independent             General bed mobility comments: No assistance needed, HOB elevated    Transfers Overall transfer level: Needs assistance Equipment used: None Transfers: Sit to/from Stand, Bed to chair/wheelchair/BSC Sit to Stand: Contact guard assist   Step pivot transfers: Supervision       General transfer comment: sup for safety from EOB. CGA  with cues for seated rest with rollator.    Ambulation/Gait Ambulation/Gait assistance: Contact guard assist, Supervision Gait Distance (Feet): 400 Feet Assistive device: Rollator (4 wheels) Gait Pattern/deviations: Step-through pattern, Decreased stride length, Trunk flexed Gait velocity: decr     General Gait Details: kyphotic flexed posture. no overt LOB and maintained safe position to RW. CGA fading to supervision for safety. 1x setaed rest at ~200' with rollator seat.   Stairs             Wheelchair Mobility     Tilt Bed    Modified Rankin (Stroke Patients Only)       Balance Overall balance assessment: Mild deficits observed, not formally tested                                          Cognition Arousal: Alert Behavior During Therapy: WFL for tasks assessed/performed Overall Cognitive Status: Within Functional Limits for tasks assessed                                          Exercises      General Comments        Pertinent Vitals/Pain Pain Assessment Pain Assessment: No/denies pain Pain Intervention(s): Monitored during session    Home Living  Prior Function            PT Goals (current goals can now be found in the care plan section) Acute Rehab PT Goals PT Goal Formulation: With patient Time For Goal Achievement: 01/10/24 Potential to Achieve Goals: Good Progress towards PT goals: Progressing toward goals    Frequency    Min 1X/week      PT Plan      Co-evaluation              AM-PAC PT 6 Clicks Mobility   Outcome Measure  Help needed turning from your back to your side while in a flat bed without using bedrails?: None Help needed moving from lying on your back to sitting on the side of a flat bed without using bedrails?: None Help needed moving to and from a bed to a chair (including a wheelchair)?: A Little Help needed standing up from a chair  using your arms (e.g., wheelchair or bedside chair)?: A Little Help needed to walk in hospital room?: A Little Help needed climbing 3-5 steps with a railing? : A Little 6 Click Score: 20    End of Session Equipment Utilized During Treatment: Oxygen ;Gait belt Activity Tolerance: Patient tolerated treatment well Patient left: in chair;with call bell/phone within reach Nurse Communication: Mobility status PT Visit Diagnosis: Unsteadiness on feet (R26.81);Other abnormalities of gait and mobility (R26.89);Muscle weakness (generalized) (M62.81)     Time: 8768-8683 PT Time Calculation (min) (ACUTE ONLY): 45 min  Charges:    $Gait Training: 8-22 mins $Therapeutic Activity: 23-37 mins PT General Charges $$ ACUTE PT VISIT: 1 Visit                     Vernell DONEEN KLEIN, DPT Acute Rehabilitation Services Office [phone]  12/31/23 5:51 PM

## 2023-12-31 NOTE — Plan of Care (Signed)
  Problem: Education: Goal: Knowledge of General Education information will improve Description: Including pain rating scale, medication(s)/side effects and non-pharmacologic comfort measures Outcome: Progressing   Problem: Health Behavior/Discharge Planning: Goal: Ability to manage health-related needs will improve Outcome: Progressing   Problem: Clinical Measurements: Goal: Ability to maintain clinical measurements within normal limits will improve Outcome: Progressing Goal: Cardiovascular complication will be avoided Outcome: Progressing   Problem: Activity: Goal: Risk for activity intolerance will decrease Outcome: Progressing   Problem: Nutrition: Goal: Adequate nutrition will be maintained Outcome: Progressing   Problem: Safety: Goal: Ability to remain free from injury will improve Outcome: Progressing   Problem: Skin Integrity: Goal: Risk for impaired skin integrity will decrease Outcome: Progressing

## 2023-12-31 NOTE — Plan of Care (Signed)

## 2023-12-31 NOTE — TOC Progression Note (Signed)
 Transition of Care [facility]) - Progression Note    Patient Details  Name: Linda [name] MRN: [id number] Date of Birth: March 15, 1946  [facility] CM/SW Contact  Marval Gell, RN Phone Number: 12/31/2023, 9:58 AM  Clinical Narrative:     Orders faxed to [facility]  at [phone] at the [facility]  Expected Discharge Plan: Home w Home Health Services Barriers to Discharge: Continued Medical Work up  Expected Discharge Plan and Services   Discharge Planning Services: CM Consult Post Acute Care Choice: Home Health Living arrangements for the past 2 months: Independent Living Facility                 DME Arranged: N/A DME Agency: NA       HH Arranged: NA           Social Determinants of Health (SDOH) Interventions SDOH Screenings   Food Insecurity: No Food Insecurity (12/26/2023)  Housing: Low Risk  (12/26/2023)  Transportation Needs: Unmet Transportation Needs (12/26/2023)  Utilities: Not At Risk (12/26/2023)  Alcohol  Screen: Low Risk  (01/01/2023)  Depression (PHQ2-9): Low Risk  (10/07/2023)  Recent Concern: Depression (PHQ2-9) - Medium Risk (08/05/2023)  Financial Resource Strain: Low Risk  (01/01/2023)  Physical Activity: Insufficiently Active (01/01/2023)  Social Connections: Moderately Isolated (12/26/2023)  Stress: No Stress Concern Present (01/01/2023)  Tobacco Use: Low Risk  (12/25/2023)    Readmission Risk Interventions    12/29/2023    4:37 PM 07/16/2023    9:36 AM  Readmission Risk Prevention Plan  Transportation Screening Complete Complete  PCP or Specialist Appt within 3-5 Days Complete   Palliative Care Screening Not Applicable   Medication Review (RN Care Manager) Complete Complete  PCP or Specialist appointment within 3-5 days of discharge  Complete  HRI or Home Care Consult  Complete  SW Recovery Care/Counseling Consult  Complete  Palliative Care Screening  Not Applicable  Skilled Nursing Facility  Not Applicable

## 2024-01-01 DIAGNOSIS — J9621 Acute and chronic respiratory failure with hypoxia: Secondary | ICD-10-CM | POA: Diagnosis not present

## 2024-01-01 DIAGNOSIS — J4551 Severe persistent asthma with (acute) exacerbation: Secondary | ICD-10-CM | POA: Diagnosis not present

## 2024-01-01 LAB — COMPREHENSIVE METABOLIC PANEL
ALT: 24 U/L (ref 0–44)
AST: 20 U/L (ref 15–41)
Albumin: 2.7 g/dL — ABNORMAL LOW (ref 3.5–5.0)
Alkaline Phosphatase: 48 U/L (ref 38–126)
Anion gap: 5 (ref 5–15)
BUN: 28 mg/dL — ABNORMAL HIGH (ref 8–23)
CO2: 32 mmol/L (ref 22–32)
Calcium: 8.5 mg/dL — ABNORMAL LOW (ref 8.9–10.3)
Chloride: 102 mmol/L (ref 98–111)
Creatinine, Ser: 1.04 mg/dL — ABNORMAL HIGH (ref 0.44–1.00)
GFR, Estimated: 55 mL/min — ABNORMAL LOW (ref >60)
Glucose, Bld: 118 mg/dL — ABNORMAL HIGH (ref 70–99)
Potassium: 5.5 mmol/L — ABNORMAL HIGH (ref 3.5–5.1)
Sodium: 139 mmol/L (ref 135–145)
Total Bilirubin: 0.5 mg/dL (ref 0.0–1.2)
Total Protein: 6 g/dL — ABNORMAL LOW (ref 6.5–8.1)

## 2024-01-01 MED ORDER — CLONAZEPAM 0.5 MG PO TABS
0.5000 mg | ORAL_TABLET | Freq: Two times a day (BID) | ORAL | Status: DC
  Administered 2024-01-01 – 2024-01-02 (×3): 0.5 mg via ORAL
  Filled 2024-01-01 (×3): qty 1

## 2024-01-01 MED ORDER — TEMAZEPAM 7.5 MG PO CAPS
7.5000 mg | ORAL_CAPSULE | Freq: Every day | ORAL | Status: DC
  Administered 2024-01-01: 7.5 mg via ORAL
  Filled 2024-01-01: qty 1

## 2024-01-01 MED ORDER — RAMELTEON 8 MG PO TABS
8.0000 mg | ORAL_TABLET | Freq: Every day | ORAL | Status: DC
  Administered 2024-01-01 – 2024-01-05 (×5): 8 mg via ORAL
  Filled 2024-01-01 (×9): qty 1

## 2024-01-01 MED ORDER — POLYETHYLENE GLYCOL 3350 17 G PO PACK
17.0000 g | PACK | Freq: Every day | ORAL | Status: DC | PRN
  Administered 2024-01-02 – 2024-01-05 (×4): 17 g via ORAL
  Filled 2024-01-01 (×3): qty 1

## 2024-01-01 NOTE — Progress Notes (Signed)
 Occupational Therapy Treatment Patient Details Name: Linda [name] MRN: [id number] DOB: 12-15-1946 Today's Date: 01/01/2024   History of present illness Pt is a 78 y.o. female who presents 12/25/23 with asthma exacerbation. PMH - chronic bil knee pain due to osteoarthritis, chronic low back pain, anxiety/depression, osteoporosis, chf, copd, htn, fibromyalgia, PE.   OT comments  Pt c/o generalized pain and SOB with coughing at all times, overall in good spirits and eager to improve. Pt doing well with ADLs and mobility, lives alone, PLOF ind, currently set up/supervision for ADLs and mobility with or without RW. Pt able to stand and ambulate at bedside unsupported for 15 mins at a time while dressing/making bed, R knee buckles occasionally but able to maintain balance unsupported. O2 levels 88-95 during activities on 3L O2 via [place]. Pt would benefit from continued acute OT to maximize activity tolerance and return to PLOF, HHOT still recommended. BSC delivered to bedside      If plan is discharge home, recommend the following:  A little help with walking and/or transfers;A little help with bathing/dressing/bathroom;Assistance with cooking/housework;Assist for transportation   Equipment Recommendations  BSC/3in1    Recommendations for Other Services      Precautions / Restrictions Precautions Precautions: Fall Precaution Comments: 3L O2 at all times Restrictions Weight Bearing Restrictions Per Provider Order: No       Mobility Bed Mobility Overal bed mobility: Modified Independent                  Transfers Overall transfer level: Needs assistance Equipment used: None Transfers: Sit to/from Stand, Bed to chair/wheelchair/BSC Sit to Stand: Supervision     Step pivot transfers: Supervision     General transfer comment: supervision for safety with or without AD     Balance Overall balance assessment: Mild deficits observed, not formally tested                                          ADL either performed or assessed with clinical judgement   ADL Overall ADL's : Needs assistance/impaired Eating/Feeding: Set up;Supervision/ safety   Grooming: Wash/dry hands;Wash/dry face;Supervision/safety;Standing   Upper Body Bathing: Set up;Sitting   Lower Body Bathing: Set up;Sit to/from stand   Upper Body Dressing : Set up;Sitting   Lower Body Dressing: Set up;Sit to/from stand   Toilet Transfer: Supervision/safety;Ambulation   Toileting- Clothing Manipulation and Hygiene: Supervision/safety;Sit to/from stand       Functional mobility during ADLs: Supervision/safety General ADL Comments: supervision for mobility around room, sats 88-95 with activities on 3L O2. doing well with ADLs overall    Extremity/Trunk Assessment Upper Extremity Assessment Upper Extremity Assessment: Overall WFL for tasks assessed            Vision       Perception     Praxis      Cognition Arousal: Alert Behavior During Therapy: WFL for tasks assessed/performed Overall Cognitive Status: Within Functional Limits for tasks assessed                                          Exercises      Shoulder Instructions       General Comments      Pertinent Vitals/ Pain       Pain Assessment Pain Assessment:  Faces Faces Pain Scale: Hurts little more Pain Location: back, ribs when coughing, B knees, Pain Descriptors / Indicators: Discomfort, Grimacing Pain Intervention(s): Monitored during session  Home Living                                          Prior Functioning/Environment              Frequency  Min 1X/week        Progress Toward Goals  OT Goals(current goals can now be found in the care plan section)  Progress towards OT goals: Progressing toward goals  Acute Rehab OT Goals Patient Stated Goal: to manage pain OT Goal Formulation: With patient Time For Goal Achievement:  01/09/24 Potential to Achieve Goals: Good ADL Goals Pt Will Transfer to Toilet: with modified independence Pt Will Perform Toileting - Clothing Manipulation and hygiene: with modified independence Additional ADL Goal #1: Pt will be able to tolerate >5 mins of continuous OOB activities to maximize activity tolerance using rollator as needed.  Plan      Co-evaluation                 AM-PAC OT 6 Clicks Daily Activity     Outcome Measure   Help from another person eating meals?: None Help from another person taking care of personal grooming?: A Little Help from another person toileting, which includes using toliet, bedpan, or urinal?: A Little Help from another person bathing (including washing, rinsing, drying)?: A Little Help from another person to put on and taking off regular upper body clothing?: A Little Help from another person to put on and taking off regular lower body clothing?: A Little 6 Click Score: 19    End of Session Equipment Utilized During Treatment: Gait belt  OT Visit Diagnosis: Muscle weakness (generalized) (M62.81);Pain Pain - part of body: Knee   Activity Tolerance Patient tolerated treatment well   Patient Left in bed;with call bell/phone within reach   Nurse Communication Mobility status        Time: 8649-8576 OT Time Calculation (min): 33 min  Charges: OT General Charges $OT Visit: 1 Visit OT Treatments $Self Care/Home Management : 23-37 mins  [place], OTR/L   Linda [name] 01/01/2024, 2:29 PM

## 2024-01-01 NOTE — Evaluation (Signed)
 Clinical/Bedside Swallow Evaluation Patient Details  Name: Linda [name] MRN: [id number] Date of Birth: [birth year]  Today's Date: 01/01/2024 Time: SLP Start Time (ACUTE ONLY): 1011 SLP Stop Time (ACUTE ONLY): 1049 SLP Time Calculation (min) (ACUTE ONLY): 38 min  Past Medical History:  Past Medical History:  Diagnosis Date   Acute upper GI bleed 06/2014   while pt taking coumadin , plavix , and meloxicam ---despite being told not to take coumadin .   Anginal pain (HCC)    Nonobstructive CAD 2014; however, her cardiologist put her on a statin for this and NOT for hyperlipidemia per pt report.  Atyp CP 08/2017 at card f/u, plan for myoc perf imaging.   Anxiety    panic attacks   BPPV (benign paroxysmal positional vertigo) 12/16/2012   CAD (coronary artery disease)    Nonobstructive ([facility])   Chronic diastolic CHF (congestive heart failure) (HCC)    dry wt as of 11/06/16 is 168 lbs.   Chronic lower back pain    DDD (degenerative disc disease)    lumbar and cervical.    Diverticular disease    Fibromyalgia    Patient states dx was around her late 52s but she had sx's for years prior to this.   H/O hiatal hernia    History of pneumonia    hospitalized 12/2011, 02/2013, and 07/2013 [facility]) for this   HTN (hypertension)    Renal artery dopplers 04/2013 neg for stenosis.   Hypervitaminosis D 09/27/2019   over-supplemented.  Stopped vit D and plan recheck 2 mo.   Idiopathic angio-edema-urticaria 27985; 2021   Angioedema component was very minimal.  2021->Dr. Bobbitt (allergist) eval.   Insomnia    Iron  deficiency anemia    Hematologist in [place], [place] did extensive w/u; no cause found; failed oral supplement;; gets fairly regular (q40m or so) IV iron  infusions (Venofer  -iron  sucrose- 200mg  with procrit .  for 14 yr I've been getting blood work q month & getting infusions prn (07/12/2013).  Dr. Timmy locally, iron  infusions done, EPO deficiency dx'd   Migraine syndrome    not as often  anymore; used to be ~ q wk (07/12/2013)   Mixed incontinence urge and stress    Nephrolithiasis    passed all on my own or they are still in there (07/12/2013)   Neuroleptic induced parkinsonism (HCC) 2018   Dr. Evonnie, neuro, saw her 11/24/17 and recommended d/c of abilify  as first step.  D/c'd abilify  and pt got complete recovery.   Oropharyngeal dysphagia    swallowing study speech path 05/2020. Gastric bx's showed gastritis, h pylori NEG   OSA on CPAP    prior to move to [place]--had another sleep study 10/2015 w/pulm Dr. Nathanael.   Osteoarthritis    severe; progressing fast (07/12/2013); multiple joints-not surgical candidate for TKR (03/2015).  Triamcinolon knee injections by Dr. Arlana 12/2017.   Pernicious anemia 08/24/2014   Pleural plaque with presence of asbestos 07/22/2013   Pulmonary embolism (HCC) 07/2013   Dx at [facility] with very small peripheral upper lobe pe 07/2013: pt took coumadin  for about 8-9 mo   Pyelonephritis    several times over the last 30 yr (07/12/2013)   RBBB (right bundle branch block)    Recurrent major depression (HCC)    Recurrent UTI    hx of hospitalization for pyelonephritis; started abx prophylaxis 06/2015   Syncope    Hypotensive; ED visit--Dr. Levern did Cath--nonobstructive CAD, EF 55-60%.  In retrospect, suspect pt rx med misuse/polypharmacy   Vertebral compression fracture (  HCC)    Acute T12 on 02/27/22 (fall).  multiple old thoracic-->neurosurg to do MRI as of 05/2022   Past Surgical History:  Past Surgical History:  Procedure Laterality Date   APPENDECTOMY  1960   AXILLARY SURGERY Left 1978   Multiple lump in armpit per pt   BIOPSY  06/17/2020   Gastric bx->gastritis, h pylori neg.  Procedure: BIOPSY;  Surgeon: Rollin Dover, MD;  Location: [facility];  Service: Endoscopy;;   CARDIAC CATHETERIZATION  01/2013   nonobstructive CAD, EF 55-60%   CARDIOVASCULAR STRESS TEST  02/22/2015   Low risk myocard perf imaging; wall motion normal, normal EF    CARDIOVASCULAR STRESS TEST     09/2017 myo perf low risk   carotid duplex doppler  10/21/2017   01/2023 1-30% bilat-->rpt 1 yr   COCCYX REMOVAL  1972   CT CTA CORONARY W/CA SCORE W/CM &/OR WO/CM     04/02/23 96%'tile calcium  score, nonobst CAD-->crestor  increased to 40   DEXA  06/05/2017   T-score -3.1   DILATION AND CURETTAGE OF UTERUS  ? 1970's   ESOPHAGOGASTRODUODENOSCOPY N/A 07/19/2014   Gastritis found + in the setting of supratherapeutic INR, +plavix , + meloxicam .   ESOPHAGOGASTRODUODENOSCOPY (EGD) WITH PROPOFOL  N/A 06/17/2020   NO stricture or other prob to explain pt's dysphagia, dilation was done anyway.  Gastric bx's-->gastritis, h pylori neg. Procedure: ESOPHAGOGASTRODUODENOSCOPY (EGD) WITH PROPOFOL ;  Surgeon: Rollin Dover, MD;  Location: [facility];  Service: Endoscopy;  Laterality: N/A;   EYE SURGERY Left 2012-2013   injections for ~ 1 yr; don't really know what for (07/12/2013)   HEEL SPUR SURGERY Left 2008   kidney stone removal Right    KNEE SURGERY  2005   LEFT HEART CATHETERIZATION WITH CORONARY ANGIOGRAM N/A 01/30/2013   Procedure: LEFT HEART CATHETERIZATION WITH CORONARY ANGIOGRAM;  Surgeon: Rober LOISE Chroman, MD;  Location: [facility];  Service: Cardiovascular;  Laterality: N/A;   MALONEY DILATION  06/17/2020   Procedure: AGAPITO DILATION;  Surgeon: Rollin Dover, MD;  Location: [facility];  Service: Endoscopy;;   nasolaryngoscopy     06/2023 during hosp for resp failure-->normal   PLANTAR FASCIA RELEASE Left 2008   SPIROMETRY  04/25/2014   In hosp for acute asthma/COPD flare: mixed obstructive and restrictive lung disease. The FEV1 is severely reduced at 45% predicted.  FEV1 signif decreased compared to prior spirometry 07/23/13.   TENDON RELEASE  1996   Right forearm and hand   TOTAL ABDOMINAL HYSTERECTOMY  1974   TRANSTHORACIC ECHOCARDIOGRAM  01/2013; 04/2014;08/2015; 09/2017   2014--NORMAL.  2015--focal basal septal hypertrophy, EF 55-60%, grade I diast dysfxn,  mild LAE.  08/2015 EF 55-60%, nl LV syst fxn, grade I DD, valves wnl. 10/21/17: EF 65-70%, grd I DD, o/w normal. 02/17/22 EF 70-75%, hyperdynamic LV fxn, grd I DD.   HPI:  Pt is a 78 y.o. female who presents 12/25/23 with asthma exacerbation. Pt has had multiple MBS (most recently in July 2024) that have all described a prominent CP. Pharyngeal swallow is generally functional but tight UES did contribute to frank penetration x1 in June 2021. EGD in June 2021 without stricture. ENT saw during admission in July 2024 with laryngoscopy WNL. Esophagram was attempted in July 2024 as well but was severely limited and although no gross stricture was identified, evaluation of esophageal motility was not possible. PMH - restrictive lung disease and chronic hypoxemic respiratory failure in setting of asbesosis with pleural plaques along with asthma, GERD, HH, PNA, chronic bil knee  pain due to osteoarthritis, chronic low back pain, anxiety/depression, osteoporosis, chf, copd, htn, fibromyalgia, PE.    Assessment / Plan / Recommendation  Clinical Impression  Pt is coughing at baseline, with evidence of dystussia c/b subjective weakness as well as moments of voicing during coughing and huffing. Encouraged sips of water as a suppression strategy, with no overt evidence of oropharyngeal dysphagia noted, but also with very temporary relief from coughing. Pt has had multiple swallow studies before that have primarily shown evidence of a very prominent CP, but has been reporting symptoms of dysphagia since at least 2021. Suspect that she may be experiencing more esophageal symptoms as imaging for motility has been limited and pt does report some relief after previous stretching. Also question if aspiration is occurring if it would be intermittent in nature, as it has not been captured on multiple MBS. Repeat imaging may not yield different results (most recent study in July 2024) so would consider this only if pt reports a  subjective change in her symptoms. It is hard to get a clear response about this, but pt keeps telling me that she does not see a correlation between her coughing and her swallowing difficulties. She does however think that she is now needing to swipe her finger to the back of her throat to remove small particles of food. Encouraged her to think carefully about her swallowing as she eats and drinks today, and will f/u to see if repeat imaging could be beneficial.   SLP also provided handout about cough suppression and mitigation strategies. Reviewed them with pt and attempted to implement them. Pt felt like the mitigation strategies were not helpful and actually felt like she was having a harder time breathing when she used them. From SLP perspective it did seem to slow her coughing but again it was very temporary and she would begin coughing again. Handout was left with pt and she was encouraged to read it and try to start implementing some of them. The one in particular she felt might be helpful was to take a small sip of water when she has the urge to cough. SLP will continue to follow acutely, but note that she was receiving [facility] SLP services PTA (sounds like she was working on RMST?) and it would be beneficial for this to continue at discharge as well.   SLP Visit Diagnosis: Dysphagia, unspecified (R13.10)    Aspiration Risk  Mild aspiration risk    Diet Recommendation Regular;Thin liquid    Liquid Administration via: Cup;Straw Medication Administration:  (as able) Supervision: Patient able to self feed;Intermittent supervision to cue for compensatory strategies Compensations: Slow rate;Small sips/bites Postural Changes: Seated upright at 90 degrees;Remain upright for at least 30 minutes after po intake    Other  Recommendations Oral Care Recommendations: Oral care BID    Recommendations for follow up therapy are one component of a multi-disciplinary discharge planning process, led by the  attending physician.  Recommendations may be updated based on patient status, additional functional criteria and insurance authorization.  Follow up Recommendations Home health SLP      Assistance Recommended at Discharge    Functional Status Assessment Patient has had a recent decline in their functional status and demonstrates the ability to make significant improvements in function in a reasonable and predictable amount of time.  Frequency and Duration min 2x/week  2 weeks       Prognosis Prognosis for improved oropharyngeal function: Fair Barriers to Reach Goals: Time post onset  Swallow Study   General HPI: Pt is a 78 y.o. female who presents 12/25/23 with asthma exacerbation. Pt has had multiple MBS (most recently in July 2024) that have all described a prominent CP. Pharyngeal swallow is generally functional but tight UES did contribute to frank penetration x1 in June 2021. EGD in June 2021 without stricture. ENT saw during admission in July 2024 with laryngoscopy WNL. Esophagram was attempted in July 2024 as well but was severely limited and although no gross stricture was identified, evaluation of esophageal motility was not possible. PMH - restrictive lung disease and chronic hypoxemic respiratory failure in setting of asbesosis with pleural plaques along with asthma, GERD, HH, PNA, chronic bil knee pain due to osteoarthritis, chronic low back pain, anxiety/depression, osteoporosis, chf, copd, htn, fibromyalgia, PE. Type of Study: Bedside Swallow Evaluation Previous Swallow Assessment: see HPI Diet Prior to this Study: Regular;Thin liquids (Level 0) Temperature Spikes Noted: No Respiratory Status: Nasal cannula History of Recent Intubation: No Behavior/Cognition: Alert;Cooperative Oral Cavity Assessment: Other (comment) (being tx for thrush) Oral Care Completed by SLP: No Vision: Functional for self-feeding Self-Feeding Abilities: Able to feed self Patient Positioning:  Partially reclined (pt declined sitting more upright) Baseline Vocal Quality: Normal Volitional Cough: Weak;Congested;Other (Comment) (see clinical impressions)    Oral/Motor/Sensory Function     Ice Chips Ice chips: Not tested   Thin Liquid Thin Liquid: Within functional limits Presentation: Self Fed;Straw    Nectar Thick Nectar Thick Liquid: Not tested   Honey Thick Honey Thick Liquid: Not tested   Puree Puree: Not tested   Solid     Solid: Not tested      Leita SAILOR., M.A. CCC-SLP Acute Rehabilitation Services Office [phone]  Secure chat preferred  01/01/2024,11:04 AM

## 2024-01-01 NOTE — Care Management Important Message (Signed)
 Important Message  Patient Details  Name: Linda [name] MRN: [id number] Date of Birth: 29-Nov-1946   Important Message Given:  Yes - Medicare IM     Renie Ora 01/01/2024, 10:18 AM

## 2024-01-01 NOTE — Progress Notes (Signed)
 Mobility Specialist Progress Note:    01/01/24 1157  Mobility  Activity Ambulated with assistance in hallway  Level of Assistance Contact guard assist, steadying assist  Assistive Device Four wheel walker  Distance Ambulated (ft) 400 ft  Activity Response Tolerated well  Mobility Referral Yes  Mobility visit 1 Mobility  Mobility Specialist Start Time (ACUTE ONLY) 0945  Mobility Specialist Stop Time (ACUTE ONLY) 1005  Mobility Specialist Time Calculation (min) (ACUTE ONLY) 20 min   Pt received in bed agreeable to mobility. Required no physical assistance. Ambulated on 3L/min, SPO2 WFL. Pt was coughing throughout session, mobility specialist offered a seated rest break pt declined. Returned to room w/o fault. Situated in bed w/ call bell and personal belongings in reach. All needs met.   Thersia Minder Mobility Specialist  Please contact vis Secure Chat or  Rehab Office [phone]

## 2024-01-01 NOTE — Progress Notes (Signed)
 PROGRESS NOTE  Linda [name]  DOB: 05-Jan-1946  PCP: Candise Aleene DEL, MD FMW:[id number]  DOA: 12/25/2023  LOS: 7 days  Hospital Day: 8  Brief narrative: Linda [name] is a 78 y.o. female with PMH significant for HTN, HLD, CHF, chronically on prednisone  and 3 L oxygen  at home due to restrictive lung disease from last month and asbesosis with pleural plaques, esophageal stricture s/p prior dilatation 1/2, patient presented to the ED with complaint of shortness of breath, wheezing, productive cough For asthma, patient was also using [place]sp[place]  subcu q28 days  Found to be in asthma exacerbation CT chest showed extensive bilateral calcified pleural plaques, also showed increasing airspace disease in the medial right lower lobe suspicious for pneumonia RVP negative. COVID-19, influenza and RSV negative.  Procalcitonin negative.  Started on IV Solu-Medrol , azithromycin , bronchodilators as well as empiric antibiotics PCCM consulted 1/5, CT sinus showed findings compatible with acute sinusitis involving bilateral maxillary and sphenoid sinus fluid  Subjective: Patient was seen and examined this morning.   Sitting up at the edge of the bed  Getting ready to work with PT  As well as I entered the room, she goes in respiratory distress.  Saturation remained stable throughout on 3 L oxygen  which is her baseline. Discussed with PCCM Dr. Claudene this morning.  Assessment and plan: Acute on chronic hypoxic respiratory failure Acute exacerbation of asthma Acute sinusitis Restrictive lung disease in setting of asbestosis with pleural plaques Presented with worsening shortness of breath in the setting of pre-existing asthma, asbestosis. Admitted for asthma exacerbation.  Started on steroids. There was initial suspicion of pneumonia as well and hence she was started on Rocephin  and azithromycin . 1/5, CT sinus showed findings compatible with acute sinusitis involving bilateral maxillary and  sphenoid sinus fluid Pulmonary consultation appreciated.  Per pulmonology note, VCD possible but less likely as he has had laryngoscopy x 2.  Recent SLP evaluations with MBS did not show obvious aspiration. Patient seems to be nearing maximal treatment aimed at asthma, GERD, allergy and UACS. PCCM recommended an empiric 10-day course of Levaquin  to treat sinusitis, to complete on 1/16. Continue Magic mouthwash for thrush and throat irritation. Continue gabapentin  to help with cough suppression Continue bronchodilators, Claritin , Singulair  Will see if ENT will plan anything different for refractory cough.  I have paged the office ENT Dr. Soldatova.   Anxiety I believe there is a component of anxiety as well driving her respiratory distress.  Noted that PCCM added Klonopin  this morning.  Patient is also on maximum dose of Cymbalta .   Continue Xanax  0.5 mg TID, Cymbalta  60 mg daily, Lamictal  200 mg daily  CAD, HLD PTA meds- aspirin  81 mg daily,, Crestor  and Imdur   Continue Imdur  and Crestor .  Currently aspirin  is on hold.   Chronic diastolic heart failure Hypertension Euvolemic. PTA meds-Toprol  100 mg daily, amlodipine  5 mg daily, Imdur  30 mg twice daily, Lasix  20 mg daily Currently continued on Toprol ,, amlodipine , Imdur  and oral Lasix  40 mg daily.  Creatinine worsening.  So I stopped her Lasix  this morning. Blood pressure low normal range.  GERD Continue PPI    Chronic pain syndrome Continue oxycodone   Restless legs Requip    Mobility: Encourage ambulation  Goals of care   Code Status: Limited: Do not attempt resuscitation (DNR) -DNR-LIMITED -Do Not Intubate/DNI      DVT prophylaxis:  enoxaparin  (LOVENOX ) injection 40 mg Start: 12/26/23 1930 SCDs Start: 12/25/23 2315   Antimicrobials: Levaquin  Fluid: None Consultants: Pulmonary Family Communication: None  at bedside  Status: Inpatient Level of care:  Progressive   Patient is from: Home Needs to continue in-hospital  care: Remains short of breath, continues to have cough Anticipated d/c to: Pending clinical course      Diet:  Diet Order             Diet regular Room service appropriate? Yes; Fluid consistency: Thin  Diet effective now                   Scheduled Meds:  acetaminophen   1,000 mg Oral Once   amLODipine   5 mg Oral Daily   arformoterol   15 mcg Nebulization BID   budesonide  (PULMICORT ) nebulizer solution  0.25 mg Nebulization BID   clonazePAM   0.5 mg Oral BID   DULoxetine   60 mg Oral Daily   enoxaparin  (LOVENOX ) injection  40 mg Subcutaneous Q24H   famotidine   20 mg Oral QHS   feeding supplement  237 mL Oral BID BM   fluticasone   2 spray Each Nare Daily   gabapentin   400 mg Oral TID   guaiFENesin   600 mg Oral BID   guaiFENesin   10 mL Oral Q6H   isosorbide  mononitrate  30 mg Oral BID   lamoTRIgine   200 mg Oral Daily   levofloxacin   500 mg Oral Daily   loratadine   10 mg Oral Daily   magic mouthwash w/lidocaine   5 mL Oral TID   metoprolol  succinate  100 mg Oral Daily   montelukast   10 mg Oral QHS   multivitamin with minerals  1 tablet Oral Daily   pantoprazole   40 mg Oral BID   ramelteon   8 mg Oral QHS   revefenacin   175 mcg Nebulization Daily   sodium chloride  flush  3 mL Intravenous Q12H   temazepam   7.5 mg Oral QHS    PRN meds: acetaminophen  **OR** acetaminophen , cyclobenzaprine , HYDROcodone  bit-homatropine, ipratropium-albuterol , ondansetron  **OR** ondansetron  (ZOFRAN ) IV, oxyCODONE -acetaminophen  **AND** oxyCODONE , rOPINIRole , sodium chloride  flush, traZODone    Infusions:    Antimicrobials: Anti-infectives (From admission, onward)    Start     Dose/Rate Route Frequency Ordered Stop   12/29/23 1345  levofloxacin  (LEVAQUIN ) tablet 500 mg        500 mg Oral Daily 12/29/23 1322 01/08/24 1159   12/28/23 1400  cefTRIAXone  (ROCEPHIN ) 1 g in sodium chloride  0.9 % 100 mL IVPB  Status:  Discontinued        1 g 200 mL/hr over 30 Minutes Intravenous Every 24 hours  12/28/23 1300 12/29/23 1322   12/26/23 2315  azithromycin  (ZITHROMAX ) tablet 500 mg  Status:  Discontinued       Placed in Followed by Linked Group   500 mg Oral Daily 12/25/23 2315 12/25/23 2320   12/26/23 2200  azithromycin  (ZITHROMAX ) tablet 500 mg  Status:  Discontinued        500 mg Oral Daily 12/25/23 2321 12/25/23 2322   12/26/23 2200  azithromycin  (ZITHROMAX ) tablet 500 mg  Status:  Discontinued        500 mg Oral Daily 12/25/23 2322 12/28/23 1300   12/25/23 2314  azithromycin  (ZITHROMAX ) 500 mg in sodium chloride  0.9 % 250 mL IVPB  Status:  Discontinued       Placed in Followed by Linked Group   500 mg 250 mL/hr over 60 Minutes Intravenous Every 24 hours 12/25/23 2315 12/25/23 2320   12/25/23 2030  cefTRIAXone  (ROCEPHIN ) 2 g in sodium chloride  0.9 % 100 mL IVPB  Status:  Discontinued  2 g 200 mL/hr over 30 Minutes Intravenous Every 24 hours 12/25/23 2027 12/27/23 1727   12/25/23 2030  azithromycin  (ZITHROMAX ) 500 mg in sodium chloride  0.9 % 250 mL IVPB  Status:  Discontinued        500 mg 250 mL/hr over 60 Minutes Intravenous Every 24 hours 12/25/23 2027 12/27/23 1108       Objective: Vitals:   01/01/24 0807 01/01/24 0811  BP:  (!) 119/59  Pulse:  71  Resp:  18  Temp:    SpO2: 95% 94%    Intake/Output Summary (Last 24 hours) at 01/01/2024 1117 Last data filed at 01/01/2024 0447 Gross per 24 hour  Intake --  Output 1800 ml  Net -1800 ml   Filed Weights   12/27/23 0453 12/28/23 0546 12/29/23 0515  Weight: 70.1 kg 70.5 kg 69.6 kg   Weight change:  Body mass index is 26.35 kg/m.   Physical Exam: General exam: Pleasant, elderly Skin: No rashes, lesions or ulcers. HEENT: Atraumatic, normocephalic, no obvious bleeding Lungs: Clear to auscultation bilaterally, transmitted sounds from throat. CVS: S1, S2, no murmur,   GI/Abd: Soft, nontender, nondistended, bowel sound present,   CNS: Alert, awake, oriented x 3 Psychiatry: Mood appropriate,  Extremities:  No pedal edema, no calf tenderness, arthritic changes in hand  Data Review: I have personally reviewed the laboratory data and studies available.  F/u labs  Unresulted Labs (From admission, onward)     Start     Ordered   12/27/23 1112  Comprehensive metabolic panel  Daily,   R      12/27/23 1111            Total time spent in review of labs and imaging, patient evaluation, formulation of plan, documentation and communication with family: 45 minutes  Signed, Chapman Rota, MD [facility] 01/01/2024

## 2024-01-01 NOTE — Progress Notes (Signed)
   NAME:  Linda [name], MRN:  [id number], DOB:  1946/09/07, LOS: 7 ADMISSION DATE:  12/25/2023, CONSULTATION DATE:  12/25/23 REFERRING MD:  Dr. Silvester, MD CHIEF COMPLAINT:  asthma exacerbation   History of Present Illness:  Jazyiah Yiu is a 78 year old woman with history of restrictive lung disease and chronic hypoxemic respiratory failure in setting of asbesosis with pleural plaques along with asthma who presented to ER with wheezing and dyspnea.   PCCM consulted for asthma exacerbation. She has been given duoneb treatment and steroids. She reports feeling better already but continues to have some work of breathing.   She is baseline on 3L of O2, currently on 4-5L in ER. She denies any sick contacts. Reports some chills denies fevers. She does have cough with sputum production.   She is on 5mg  prednisone  daily.  Pertinent  Medical History  CAD CHF Chronic Back Pain [name] with pleural Plaques Chronic Hypoxemic Respiratory Failure  Significant Hospital Events: Including procedures, antibiotic start and stop dates in addition to other pertinent events   1/2 Admitted to [facility] for asthma exacerbation 1/3 Mild improvement. On baseline O2 with adequate saturation. Still wheezy. 1/4 Reports some improvement in wheezing this am but not back to baseline yet.   Interim History / Subjective:  Intermittent coughing fit and associated dyspnea. Overall becoming less frequent.  Objective   Blood pressure 117/67, pulse 84, temperature 98 F (36.7 C), temperature source Oral, resp. rate 20, height 5' 4 (1.626 m), weight 69.6 kg, SpO2 95%.        Intake/Output Summary (Last 24 hours) at 01/01/2024 0748 Last data filed at 01/01/2024 0447 Gross per 24 hour  Intake --  Output 1800 ml  Net -1800 ml   Filed Weights   12/27/23 0453 12/28/23 0546 12/29/23 0515  Weight: 70.1 kg 70.5 kg 69.6 kg    Examination: No distress Dry MM Malampatti 1 Ext warm Moves to command Good  insight Lungs relatively clear   Resolved Hospital Problem list     Assessment & Plan:  Acute on Chronic Hypoxemic Respiratory Failure  3L [place] at baseline  Asthma with Acute Exacerbation (on tespire as outpatient) Restrictive lung disease in setting of asbestosis with pleural plaques FVC 41% in 2015 CAP RLL rounded opacity (probable rounded atelectasis)- increasing on imaging with question of PNA raised although could be increasing pleural plaque vs. mesothelioma GERD Thrush -RVP panel negative, PCT neg VCD possible but less likely as she has had laryngoscopy x 2 She has had SLP evaluations and MBSS without obvious aspiration Prior EGDs with dilations Steroids do not seem to change her disease trajectory much She is nearing maximal GERD, allergy, asthma, UACS tx Not much more we can do, will see if levaquin  and magic mouthwash help things along with higher dose hycodan  P: levaquin  x 10 days to treat sinusitis and concurrent ?CAP Magic mouthwash for thrush and throat irritation Maximal asthma treatment as ordered sans prednisone  Gabapentin  increased 1/8; I am adding klonipin; already on max dose cymbalta  Would ask SLP and ENT if they have any ideas to help this refractory cough; I am running out of ideas; I have spoken with Dr. Annella and he also has tried everything as OP I voiced my concerns to patient that we may not be able to fix this; if it is affecting QoL to point she is claiming, GOC and hospice may be appropriate  Rolan Sharps MD PCCM

## 2024-01-01 NOTE — Plan of Care (Signed)
  Problem: Education: Goal: Knowledge of General Education information will improve Description: Including pain rating scale, medication(s)/side effects and non-pharmacologic comfort measures Outcome: Progressing   Problem: Health Behavior/Discharge Planning: Goal: Ability to manage health-related needs will improve Outcome: Progressing   Problem: Clinical Measurements: Goal: Will remain free from infection Outcome: Progressing Goal: Diagnostic test results will improve Outcome: Progressing Goal: Cardiovascular complication will be avoided Outcome: Progressing

## 2024-01-02 DIAGNOSIS — J4551 Severe persistent asthma with (acute) exacerbation: Secondary | ICD-10-CM | POA: Diagnosis not present

## 2024-01-02 DIAGNOSIS — J9621 Acute and chronic respiratory failure with hypoxia: Secondary | ICD-10-CM | POA: Diagnosis not present

## 2024-01-02 LAB — COMPREHENSIVE METABOLIC PANEL
ALT: 21 U/L (ref 0–44)
AST: 21 U/L (ref 15–41)
Albumin: 2.7 g/dL — ABNORMAL LOW (ref 3.5–5.0)
Alkaline Phosphatase: 50 U/L (ref 38–126)
Anion gap: 9 (ref 5–15)
BUN: 23 mg/dL (ref 8–23)
CO2: 29 mmol/L (ref 22–32)
Calcium: 8.6 mg/dL — ABNORMAL LOW (ref 8.9–10.3)
Chloride: 100 mmol/L (ref 98–111)
Creatinine, Ser: 1.2 mg/dL — ABNORMAL HIGH (ref 0.44–1.00)
GFR, Estimated: 47 mL/min — ABNORMAL LOW (ref >60)
Glucose, Bld: 132 mg/dL — ABNORMAL HIGH (ref 70–99)
Potassium: 4.8 mmol/L (ref 3.5–5.1)
Sodium: 138 mmol/L (ref 135–145)
Total Bilirubin: 0.7 mg/dL (ref 0.0–1.2)
Total Protein: 5.7 g/dL — ABNORMAL LOW (ref 6.5–8.1)

## 2024-01-02 MED ORDER — CLOTRIMAZOLE 10 MG MT TROC
10.0000 mg | Freq: Every day | OROMUCOSAL | Status: AC
  Administered 2024-01-02 – 2024-01-05 (×15): 10 mg via ORAL
  Filled 2024-01-02 (×15): qty 1

## 2024-01-02 MED ORDER — CYCLOBENZAPRINE HCL 5 MG PO TABS
5.0000 mg | ORAL_TABLET | Freq: Two times a day (BID) | ORAL | Status: DC
  Administered 2024-01-02 – 2024-01-09 (×15): 5 mg via ORAL
  Filled 2024-01-02 (×16): qty 1

## 2024-01-02 MED ORDER — CLONAZEPAM 0.5 MG PO TABS
0.5000 mg | ORAL_TABLET | Freq: Every day | ORAL | Status: DC
Start: 2024-01-02 — End: 2024-01-02

## 2024-01-02 MED ORDER — CLONAZEPAM 0.5 MG PO TABS
1.0000 mg | ORAL_TABLET | Freq: Every day | ORAL | Status: DC
  Administered 2024-01-02: 1 mg via ORAL
  Filled 2024-01-02: qty 2

## 2024-01-02 MED ORDER — TEMAZEPAM 7.5 MG PO CAPS: 30.0000 mg | ORAL_CAPSULE | Freq: Every day | ORAL | Status: DC

## 2024-01-02 MED ORDER — CLONAZEPAM 0.5 MG PO TABS
0.5000 mg | ORAL_TABLET | Freq: Every morning | ORAL | Status: DC
  Filled 2024-01-02: qty 1

## 2024-01-02 MED ORDER — LIDOCAINE HCL (PF) 2% IJ FOR NEBU: 5.0000 mL | Freq: Four times a day (QID) | RESPIRATORY_TRACT | Status: DC | PRN

## 2024-01-02 MED ORDER — TRAZODONE HCL 100 MG PO TABS
300.0000 mg | ORAL_TABLET | Freq: Every day | ORAL | Status: DC
  Administered 2024-01-02 – 2024-01-08 (×7): 300 mg via ORAL
  Filled 2024-01-02 (×7): qty 3

## 2024-01-02 MED ORDER — BENZONATATE 100 MG PO CAPS
200.0000 mg | ORAL_CAPSULE | Freq: Two times a day (BID) | ORAL | Status: DC
  Administered 2024-01-02 – 2024-01-09 (×15): 200 mg via ORAL
  Filled 2024-01-02 (×15): qty 2

## 2024-01-02 NOTE — TOC Progression Note (Signed)
 Transition of Care [facility]) - Progression Note    Patient Details  Name: Linda [name] MRN: [id number] Date of Birth: [birth year]  Transition of [facility]) CM/SW Contact  Waddell Barnie Rama, RN Phone Number: 01/02/2024, 11:24 AM  Clinical Narrative:    NCM notified [employer] that patient needs oxygen  tank to go home with she is on 3 liters at home.    Expected Discharge Plan: Home w Home Health Services Barriers to Discharge: Continued Medical Work up  Expected Discharge Plan and Services   Discharge Planning Services: CM Consult Post Acute Care Choice: Home Health Living arrangements for the past 2 months: Independent Living Facility                 DME Arranged: N/A DME Agency: NA       HH Arranged: NA           Social Determinants of Health (SDOH) Interventions SDOH Screenings   Food Insecurity: No Food Insecurity (12/26/2023)  Housing: Low Risk  (12/26/2023)  Transportation Needs: Unmet Transportation Needs (12/26/2023)  Utilities: Not At Risk (12/26/2023)  Alcohol  Screen: Low Risk  (01/01/2023)  Depression (PHQ2-9): Low Risk  (10/07/2023)  Recent Concern: Depression (PHQ2-9) - Medium Risk (08/05/2023)  Financial Resource Strain: Low Risk  (01/01/2023)  Physical Activity: Insufficiently Active (01/01/2023)  Social Connections: Unknown (12/26/2023)  Recent Concern: Social Connections - Moderately Isolated (12/26/2023)  Stress: No Stress Concern Present (01/01/2023)  Tobacco Use: Low Risk  (12/25/2023)    Readmission Risk Interventions    12/29/2023    4:37 PM 07/16/2023    9:36 AM  Readmission Risk Prevention Plan  Transportation Screening Complete Complete  PCP or Specialist Appt within 3-5 Days Complete   Palliative Care Screening Not Applicable   Medication Review (RN Care Manager) Complete Complete  PCP or Specialist appointment within 3-5 days of discharge  Complete  HRI or Home Care Consult  Complete  SW Recovery Care/Counseling Consult  Complete  Palliative  Care Screening  Not Applicable  Skilled Nursing Facility  Not Applicable

## 2024-01-02 NOTE — Consult Note (Signed)
 [facility] Liaison Consult Note  01/02/2024  Linda [name] Jun 30, 1946 [id number]  Follow up:  SDOH issues with transportation noted and discussed in unit progression TOC meeting.  Met with the patient at the bedside and she endorses transportation is with [name] 3 days a week and she was saying she will have to ask her brother if it's not on days the facility provides transportation. Updated inpatient [facility] RN of findings on SDOH.  Patient states she has [employer] [BCBS] for supplement and sometimes when she is in the hospital that it doesn't get filed correctly with BCBS supplement  Encouraged patient to speak with [employer] as well. Patient verbalized understanding but states, the hospitalist should file it right.   Richerd Fish, RN, BSN, CCM [employer], [facility] Liaison Direct Dial: [phone] or secure chat Email: [email or web address]

## 2024-01-02 NOTE — Progress Notes (Signed)
 Speech Language Pathology Treatment: Dysphagia  Patient Details Name: Linda [name] MRN: [id number] DOB: 1946-04-23 Today's Date: 01/02/2024 Time: 9096-9085 SLP Time Calculation (min) (ACUTE ONLY): 11 min  Assessment / Plan / Recommendation Clinical Impression  Pt was seen again this morning, looking like she is falling asleep during session but she insists she is not. She says that she is trying to stop herself from coughing, and she does pick right back up in conversation after these moments. SLP found handout in room about cough suppression and mitigation strategies and reviewed it with her, reading to her as she says she needs her reading glasses. Attempted to use teach back on multiple attempts but pt will continue to need ongoing reinforcement. She did sip on her thin liquids intermittently to try to reduce urge to cough. Note that she seemed to be coughing less overall today and not directly related to thin liquids. She had already eaten her breakfast and denies any increase in frequency or urge to cough during or after meal. Her only complaint pertaining to PO intake this morning is that food sometimes feels stuck in her chest (which may be more in line with esophageal component). It is still possible that her prominent CP is impacting swallowing especially with boluses that are more dense, but again, not sure we will capture this differently on another repeat MBS. Could consider FEES for direct visualization of her vocal folds during PO intake, but she has also already had laryngoscopy done and additional ENT consult is pending. This could even be considered on an OP basis after acute hospitalization. SLP will continue to follow and assist as able, also recommending continued SLP f/u after discharge.    HPI HPI: Pt is a 78 y.o. female who presents 12/25/23 with asthma exacerbation. Pt has had multiple MBS (most recently in July 2024) that have all described a prominent CP. Pharyngeal swallow  is generally functional but tight UES did contribute to frank penetration x1 in June 2021. EGD in June 2021 without stricture. ENT saw during admission in July 2024 with laryngoscopy WNL. Esophagram was attempted in July 2024 as well but was severely limited and although no gross stricture was identified, evaluation of esophageal motility was not possible. PMH - restrictive lung disease and chronic hypoxemic respiratory failure in setting of asbesosis with pleural plaques along with asthma, GERD, HH, PNA, chronic bil knee pain due to osteoarthritis, chronic low back pain, anxiety/depression, osteoporosis, chf, copd, htn, fibromyalgia, PE.      SLP Plan  Continue with current plan of care      Recommendations for follow up therapy are one component of a multi-disciplinary discharge planning process, led by the attending physician.  Recommendations may be updated based on patient status, additional functional criteria and insurance authorization.    Recommendations  Diet recommendations: Regular;Thin liquid Liquids provided via: Cup;Straw Medication Administration: Other (Comment) (as able) Supervision: Patient able to self feed;Intermittent supervision to cue for compensatory strategies Compensations: Slow rate;Small sips/bites Postural Changes and/or Swallow Maneuvers: Seated upright 90 degrees;Upright 30-60 min after meal                  Oral care BID   Intermittent Supervision/Assistance Dysphagia, unspecified (R13.10)     Continue with current plan of care     Leita SAILOR., M.A. CCC-SLP Acute Rehabilitation Services Office [phone]  Secure chat preferred   01/02/2024, 9:22 AM

## 2024-01-02 NOTE — Progress Notes (Signed)
   NAME:  Linda [name], MRN:  [id number], DOB:  03-28-1946, LOS: 8 ADMISSION DATE:  12/25/2023, CONSULTATION DATE:  12/25/23 REFERRING MD:  Dr. Silvester, MD CHIEF COMPLAINT:  asthma exacerbation   History of Present Illness:  Charrie Mcconnon is a 78 year old woman with history of restrictive lung disease and chronic hypoxemic respiratory failure in setting of asbesosis with pleural plaques along with asthma who presented to ER with wheezing and dyspnea.   PCCM consulted for asthma exacerbation. She has been given duoneb treatment and steroids. She reports feeling better already but continues to have some work of breathing.   She is baseline on 3L of O2, currently on 4-5L in ER. She denies any sick contacts. Reports some chills denies fevers. She does have cough with sputum production.   She is on 5mg  prednisone  daily.  Pertinent  Medical History  CAD CHF Chronic Back Pain [name] with pleural Plaques Chronic Hypoxemic Respiratory Failure  Significant Hospital Events: Including procedures, antibiotic start and stop dates in addition to other pertinent events   1/2 Admitted to [facility] for asthma exacerbation 1/3 Mild improvement. On baseline O2 with adequate saturation. Still wheezy. 1/4 Reports some improvement in wheezing this am but not back to baseline yet.   Interim History / Subjective:  Still bad cough at night Still DOE Still sleeping poorly  Objective   Blood pressure 104/61, pulse 75, temperature 98.3 F (36.8 C), temperature source Oral, resp. rate 18, height 5' 4 (1.626 m), weight 71.4 kg, SpO2 96%.        Intake/Output Summary (Last 24 hours) at 01/02/2024 1422 Last data filed at 01/02/2024 9185 Gross per 24 hour  Intake --  Output 900 ml  Net -900 ml   Filed Weights   12/28/23 0546 12/29/23 0515 01/02/24 0526  Weight: 70.5 kg 69.6 kg 71.4 kg    Examination: No distress Dry MM Malampatti 1 Ext warm Moves to command Good insight Lungs relatively  clear   Resolved Hospital Problem list     Assessment & Plan:  Acute on Chronic Hypoxemic Respiratory Failure  3L [place] at baseline  Asthma with Acute Exacerbation (on tespire as outpatient) Restrictive lung disease in setting of asbestosis with pleural plaques FVC 41% in 2015 CAP RLL rounded opacity (probable rounded atelectasis)- increasing on imaging with question of PNA raised although could be increasing pleural plaque vs. mesothelioma GERD Thrush -RVP panel negative, PCT neg VCD possible but less likely as she has had laryngoscopy x 2 She has had SLP evaluations and MBSS without obvious aspiration Prior EGDs with dilations Steroids do not seem to change her disease trajectory much She is nearing maximal GERD, allergy, asthma, UACS tx She has been on strong abx for possible sinus component and CAP. Trial of benzos do not seem to be helping. This is either advancing asbestosis causing pleural irritation or refractory functional cough syndrome  P: levaquin  x 10 days to treat sinusitis and concurrent ?CAP Clotrimazole  troche x 3 more days for thrush and throat irritation Maximal asthma treatment as ordered sans prednisone  Continue gabapentin , klonipin, increase sleep meds Trial of lidocaine  nebs Asking PMT to re-engage Monday, she seems miserable and we have not been able to control her symptoms to date Will check on her tomorrow to see if increased sleep meds or lidocaine  nebs helped  Rolan Sharps MD PCCM

## 2024-01-02 NOTE — Progress Notes (Signed)
 PROGRESS NOTE  Linda [name]  DOB: 07-24-1946  PCP: Candise Aleene DEL, MD FMW:[id number]  DOA: 12/25/2023  LOS: 8 days  Hospital Day: 9  Brief narrative: Linda [name] is a 78 y.o. female with PMH significant for HTN, HLD, CHF, chronically on prednisone  and 3 L oxygen  at home due to restrictive lung disease from last month and asbesosis with pleural plaques, esophageal stricture s/p prior dilatation 1/2, patient presented to the ED with complaint of shortness of breath, wheezing, productive cough For asthma, patient was also using Tezsp[place]  subcu q28 days  Found to be in asthma exacerbation CT chest showed extensive bilateral calcified pleural plaques, also showed increasing airspace disease in the medial right lower lobe suspicious for pneumonia RVP negative. COVID-19, influenza and RSV negative.  Procalcitonin negative.  Started on IV Solu-Medrol , azithromycin , bronchodilators as well as empiric antibiotics PCCM consulted 1/5, CT sinus showed findings compatible with acute sinusitis involving bilateral maxillary and sphenoid sinus fluid  Subjective: Patient was seen and examined this morning. Propped up in bed.  Not in distress. Has intermittent cough.  Respiratory status stable in the last several days on 3 L. Anticipate desaturation on any attempt to move.  Per mobility note, she has not had any documented desaturation event on ambulation for last few days. I believe anxiety is fueling her symptoms  Assessment and plan: Acute on chronic hypoxic respiratory failure Acute exacerbation of asthma Acute sinusitis Restrictive lung disease in setting of asbestosis with pleural plaques Presented with worsening shortness of breath in the setting of pre-existing asthma, asbestosis. Admitted for asthma exacerbation.  Started on steroids. There was initial suspicion of pneumonia as well and hence she was started on Rocephin  and azithromycin . 1/5, CT sinus showed findings  compatible with acute sinusitis involving bilateral maxillary and sphenoid sinus fluid Pulmonary consultation appreciated.  Per pulmonology note, VCD possible but less likely as he has had laryngoscopy x 2.  Recent SLP evaluations with MBS did not show obvious aspiration. Patient seems to be nearing maximal treatment aimed at asthma, GERD, allergy and UACS. PCCM recommended an empiric 10-day course of Levaquin  to treat sinusitis, to complete on 1/16. Continue Magic mouthwash for thrush and throat irritation. Continue gabapentin  to help with cough suppression Continue bronchodilators, Claritin , Singulair  As recommended by PCCM and speech therapist, I got in touch with ENT Dr. Okey on 01/01/2024.  Recommended outpatient follow-up.  Anxiety I believe there is a component of anxiety as well driving her respiratory distress.  Currently on maximum dose of Cymbalta  at 60 mg daily, Klonopin  0.5 mg twice daily, Lamictal  200 mg daily and Xanax  0.5 mg TID  CAD, HLD PTA meds- aspirin  81 mg daily,, Crestor  and Imdur   Continue Imdur  and Crestor .  Currently aspirin  is on hold.   Chronic diastolic heart failure Hypertension Euvolemic. PTA meds-Toprol  100 mg daily, amlodipine  5 mg daily, Imdur  30 mg twice daily, Lasix  20 mg daily Currently continued on Toprol , amlodipine , Imdur   1/9, Lasix  was held because of worsening creatinine.  Blood pressure currently in low normal range.  GERD Continue PPI    Chronic pain syndrome Continue oxycodone   Restless legs Requip    Mobility: Encourage ambulation  Goals of care   Code Status: Limited: Do not attempt resuscitation (DNR) -DNR-LIMITED -Do Not Intubate/DNI      DVT prophylaxis:  enoxaparin  (LOVENOX ) injection 40 mg Start: 12/26/23 1930 SCDs Start: 12/25/23 2315   Antimicrobials: Levaquin  Fluid: None Consultants: Pulmonary Family Communication: None at bedside  Status: Inpatient Level of  care:  Progressive   Patient is from:  Home Needs to continue in-hospital care: Remains short of breath, continues to have cough. Anticipated d/c to: Pending clinical course patient does not feel comfortable going home today.  She is anxious that she will be back again.  She has been hospitalized multiple times for the same issue.  Will hold 1-2 more days      Diet:  Diet Order             Diet regular Room service appropriate? Yes; Fluid consistency: Thin  Diet effective now                   Scheduled Meds:  acetaminophen   1,000 mg Oral Once   amLODipine   5 mg Oral Daily   arformoterol   15 mcg Nebulization BID   budesonide  (PULMICORT ) nebulizer solution  0.25 mg Nebulization BID   clonazePAM   0.5 mg Oral BID   DULoxetine   60 mg Oral Daily   enoxaparin  (LOVENOX ) injection  40 mg Subcutaneous Q24H   famotidine   20 mg Oral QHS   feeding supplement  237 mL Oral BID BM   fluticasone   2 spray Each Nare Daily   gabapentin   400 mg Oral TID   guaiFENesin   600 mg Oral BID   guaiFENesin   10 mL Oral Q6H   isosorbide  mononitrate  30 mg Oral BID   lamoTRIgine   200 mg Oral Daily   levofloxacin   500 mg Oral Daily   loratadine   10 mg Oral Daily   magic mouthwash w/lidocaine   5 mL Oral TID   metoprolol  succinate  100 mg Oral Daily   montelukast   10 mg Oral QHS   multivitamin with minerals  1 tablet Oral Daily   pantoprazole   40 mg Oral BID   ramelteon   8 mg Oral QHS   revefenacin   175 mcg Nebulization Daily   sodium chloride  flush  3 mL Intravenous Q12H   temazepam   7.5 mg Oral QHS    PRN meds: acetaminophen  **OR** acetaminophen , cyclobenzaprine , HYDROcodone  bit-homatropine, ipratropium-albuterol , ondansetron  **OR** ondansetron  (ZOFRAN ) IV, oxyCODONE -acetaminophen  **AND** oxyCODONE , polyethylene glycol, rOPINIRole , sodium chloride  flush, traZODone    Infusions:    Antimicrobials: Anti-infectives (From admission, onward)    Start     Dose/Rate Route Frequency Ordered Stop   12/29/23 1345  levofloxacin  (LEVAQUIN )  tablet 500 mg        500 mg Oral Daily 12/29/23 1322 01/08/24 1159   12/28/23 1400  cefTRIAXone  (ROCEPHIN ) 1 g in sodium chloride  0.9 % 100 mL IVPB  Status:  Discontinued        1 g 200 mL/hr over 30 Minutes Intravenous Every 24 hours 12/28/23 1300 12/29/23 1322   12/26/23 2315  azithromycin  (ZITHROMAX ) tablet 500 mg  Status:  Discontinued       Placed in Followed by Linked Group   500 mg Oral Daily 12/25/23 2315 12/25/23 2320   12/26/23 2200  azithromycin  (ZITHROMAX ) tablet 500 mg  Status:  Discontinued        500 mg Oral Daily 12/25/23 2321 12/25/23 2322   12/26/23 2200  azithromycin  (ZITHROMAX ) tablet 500 mg  Status:  Discontinued        500 mg Oral Daily 12/25/23 2322 12/28/23 1300   12/25/23 2314  azithromycin  (ZITHROMAX ) 500 mg in sodium chloride  0.9 % 250 mL IVPB  Status:  Discontinued       Placed in Followed by Linked Group   500 mg 250 mL/hr over 60 Minutes Intravenous Every 24  hours 12/25/23 2315 12/25/23 2320   12/25/23 2030  cefTRIAXone  (ROCEPHIN ) 2 g in sodium chloride  0.9 % 100 mL IVPB  Status:  Discontinued        2 g 200 mL/hr over 30 Minutes Intravenous Every 24 hours 12/25/23 2027 12/27/23 1727   12/25/23 2030  azithromycin  (ZITHROMAX ) 500 mg in sodium chloride  0.9 % 250 mL IVPB  Status:  Discontinued        500 mg 250 mL/hr over 60 Minutes Intravenous Every 24 hours 12/25/23 2027 12/27/23 1108       Objective: Vitals:   01/02/24 0807 01/02/24 1139  BP: 112/87 104/61  Pulse: 61 75  Resp: 18 18  Temp: 97.6 F (36.4 C) 98.3 F (36.8 C)  SpO2: 92% 96%    Intake/Output Summary (Last 24 hours) at 01/02/2024 1418 Last data filed at 01/02/2024 0814 Gross per 24 hour  Intake --  Output 900 ml  Net -900 ml   Filed Weights   12/28/23 0546 12/29/23 0515 01/02/24 0526  Weight: 70.5 kg 69.6 kg 71.4 kg   Weight change:  Body mass index is 27.02 kg/m.   Physical Exam: General exam: Pleasant, elderly Skin: No rashes, lesions or ulcers. HEENT: Atraumatic,  normocephalic, no obvious bleeding Lungs: Clear to auscultation bilaterally, transmitted sounds from throat. CVS: S1, S2, no murmur,   GI/Abd: Soft, nontender, nondistended, bowel sound present,   CNS: Alert, awake, oriented x 3 Psychiatry: Mood appropriate,  Extremities: No pedal edema, no calf tenderness, arthritic changes in hand  Data Review: I have personally reviewed the laboratory data and studies available.  F/u labs  Unresulted Labs (From admission, onward)    None       Total time spent in review of labs and imaging, patient evaluation, formulation of plan, documentation and communication with family: 45 minutes  Signed, Chapman Rota, MD [facility] 01/02/2024

## 2024-01-02 NOTE — Progress Notes (Signed)
 Physical Therapy Treatment Patient Details Name: Linda [name] MRN: [id number] DOB: [birth year] Today's Date: 01/02/2024   History of Present Illness Pt is a 78 y.o. female who presents 12/25/23 with asthma exacerbation. PMH - chronic bil knee pain due to osteoarthritis, chronic low back pain, anxiety/depression, osteoporosis, chf, copd, htn, fibromyalgia, PE.    PT Comments  Patient resting in bed and reporting fatigued but agreeable to mobilize with therapy. Pt completed bed mobility with use of bed features and extra time. Supervision from EOB, for sit<>stand with rollator and pt demo'd good management of brakes. Pt amb ~450' with no LOB, steady gait. Pt fatiguing and SOB however despite education and encouragement for seated rest break pt declined to stop and sit. Pt took 2x standing rest breaks during ambulation. EOS pt preferring to return to bed for rest as fatigued. Will continue to progress pt as able.    If plan is discharge home, recommend the following: Assistance with cooking/housework;Assist for transportation   Can travel by private vehicle        Equipment Recommendations  None recommended by PT    Recommendations for Other Services       Precautions / Restrictions Precautions Precautions: Fall Precaution Comments: 3L O2 at all times Restrictions Weight Bearing Restrictions Per Provider Order: No     Mobility  Bed Mobility Overal bed mobility: Modified Independent                  Transfers Overall transfer level: Needs assistance Equipment used: None Transfers: Sit to/from Stand, Bed to chair/wheelchair/BSC Sit to Stand: Supervision   Step pivot transfers: Supervision       General transfer comment: supervision for safety with or without AD    Ambulation/Gait Ambulation/Gait assistance: Contact guard assist, Supervision Gait Distance (Feet): 450 Feet Assistive device: Rollator (4 wheels) Gait Pattern/deviations: Step-through pattern,  Decreased stride length, Trunk flexed Gait velocity: decr     General Gait Details: kyphotic flexed posture. no overt LOB and maintained safe position to RW. CGA fading to supervision for safety. encouraged seated rest break for energy conservation to prevent fatigue, pt declined.   Stairs             Wheelchair Mobility     Tilt Bed    Modified Rankin (Stroke Patients Only)       Balance Overall balance assessment: Mild deficits observed, not formally tested                                          Cognition Arousal: Alert Behavior During Therapy: WFL for tasks assessed/performed Overall Cognitive Status: Within Functional Limits for tasks assessed                                          Exercises      General Comments        Pertinent Vitals/Pain Pain Assessment Pain Assessment: Faces Faces Pain Scale: Hurts little more Pain Location: back, ribs when coughing, B knees, Pain Descriptors / Indicators: Discomfort, Grimacing Pain Intervention(s): Limited activity within patient's tolerance, Monitored during session, Repositioned    Home Living                          Prior Function  PT Goals (current goals can now be found in the care plan section) Acute Rehab PT Goals Patient Stated Goal: to improve and return to her ILF PT Goal Formulation: With patient Time For Goal Achievement: 01/10/24 Potential to Achieve Goals: Good Progress towards PT goals: Progressing toward goals    Frequency    Min 1X/week      PT Plan      Co-evaluation              AM-PAC PT 6 Clicks Mobility   Outcome Measure  Help needed turning from your back to your side while in a flat bed without using bedrails?: None Help needed moving from lying on your back to sitting on the side of a flat bed without using bedrails?: None Help needed moving to and from a bed to a chair (including a wheelchair)?: A  Little Help needed standing up from a chair using your arms (e.g., wheelchair or bedside chair)?: A Little Help needed to walk in hospital room?: A Little Help needed climbing 3-5 steps with a railing? : A Little 6 Click Score: 20    End of Session Equipment Utilized During Treatment: Oxygen ;Gait belt Activity Tolerance: Patient tolerated treatment well Patient left: with call bell/phone within reach;in bed Nurse Communication: Mobility status PT Visit Diagnosis: Unsteadiness on feet (R26.81);Other abnormalities of gait and mobility (R26.89);Muscle weakness (generalized) (M62.81)     Time: 1010-1040 PT Time Calculation (min) (ACUTE ONLY): 30 min  Charges:    $Gait Training: 8-22 mins $Therapeutic Activity: 8-22 mins PT General Charges $$ ACUTE PT VISIT: 1 Visit                     Vernell DONEEN KLEIN, DPT Acute Rehabilitation Services Office [phone]  01/02/24 4:17 PM

## 2024-01-03 DIAGNOSIS — J4551 Severe persistent asthma with (acute) exacerbation: Secondary | ICD-10-CM | POA: Diagnosis not present

## 2024-01-03 DIAGNOSIS — J9621 Acute and chronic respiratory failure with hypoxia: Secondary | ICD-10-CM | POA: Diagnosis not present

## 2024-01-03 MED ORDER — LIDOCAINE HCL (PF) 2% IJ FOR NEBU
5.0000 mL | Freq: Two times a day (BID) | RESPIRATORY_TRACT | Status: DC
  Administered 2024-01-03 – 2024-01-09 (×12): 5 mL via RESPIRATORY_TRACT
  Filled 2024-01-03 (×14): qty 5

## 2024-01-03 MED ORDER — CLONAZEPAM 0.5 MG PO TABS
1.0000 mg | ORAL_TABLET | Freq: Every morning | ORAL | Status: DC
  Administered 2024-01-04 – 2024-01-08 (×5): 1 mg via ORAL
  Filled 2024-01-03 (×6): qty 2

## 2024-01-03 MED ORDER — CLONAZEPAM 0.5 MG PO TABS
2.0000 mg | ORAL_TABLET | Freq: Every day | ORAL | Status: DC
  Administered 2024-01-03 – 2024-01-08 (×6): 2 mg via ORAL
  Filled 2024-01-03 (×6): qty 4

## 2024-01-03 MED ORDER — MENTHOL 3 MG MT LOZG: 1.0000 | LOZENGE | OROMUCOSAL | Status: DC | PRN

## 2024-01-03 MED ORDER — PREDNISONE 5 MG PO TABS
5.0000 mg | ORAL_TABLET | Freq: Every day | ORAL | Status: DC
  Administered 2024-01-03 – 2024-01-09 (×7): 5 mg via ORAL
  Filled 2024-01-03 (×7): qty 1

## 2024-01-03 NOTE — Progress Notes (Signed)
 PROGRESS NOTE  Linda [name]  DOB: [birth year]  PCP: Candise Aleene DEL, MD FMW:[id number]  DOA: 12/25/2023  LOS: 9 days  Hospital Day: 10  Brief narrative: Linda [name] is a 78 y.o. female with PMH significant for HTN, HLD, CHF, chronically on prednisone  and 3 L oxygen  at home due to restrictive lung disease from last month and asbesosis with pleural plaques, esophageal stricture s/p prior dilatation 1/2, patient presented to the ED with complaint of shortness of breath, wheezing, productive cough For asthma, patient was also using [place]sp[place]  subcu q28 days  Found to be in asthma exacerbation CT chest showed extensive bilateral calcified pleural plaques, also showed increasing airspace disease in the medial right lower lobe suspicious for pneumonia RVP negative. COVID-19, influenza and RSV negative.  Procalcitonin negative.  Started on IV Solu-Medrol , azithromycin , bronchodilators as well as empiric antibiotics PCCM consulted 1/5, CT sinus showed findings compatible with acute sinusitis involving bilateral maxillary and sphenoid sinus fluid  Subjective: Patient was seen and examined this morning. Lying down in bed.  Not in distress. Continues to have episodic cough Remains anxious I had a long conversation with her about the limit of medical interventions.  I reiterated the recommendation of hospice care. Patient is frustrated.  She has not made a decision yet. Palliative care to follow-up with Monday Pulmonary follow-up appreciated  Assessment and plan: Acute on chronic hypoxic respiratory failure Acute exacerbation of asthma Acute sinusitis Restrictive lung disease in setting of asbestosis with pleural plaques Presented with worsening shortness of breath in the setting of pre-existing asthma, asbestosis. Admitted for asthma exacerbation.  Started on steroids. There was initial suspicion of pneumonia as well and hence she was started on Rocephin  and azithromycin . 1/5,  CT sinus showed findings compatible with acute sinusitis involving bilateral maxillary and sphenoid sinus fluid Pulmonary consultation appreciated.  Per pulmonology note, VCD possible but less likely as he has had laryngoscopy x 2.  Recent SLP evaluations with MBS did not show obvious aspiration. Patient seems to be nearing maximal treatment aimed at asthma, GERD, allergy and UACS. PCCM recommended an empiric 10-day course of Levaquin  to treat sinusitis, to complete on 1/16. Home dose of prednisone  5 mg daily resumed Continue Magic mouthwash for thrush and throat irritation. Continue gabapentin  to help with cough suppression Continue bronchodilators, Claritin , Singulair  As recommended by PCCM and speech therapist, I got in touch with ENT Dr. Soldatova on 01/01/2024.  Recommended outpatient follow-up. Palliative care to follow-up on Monday regarding the recommendation of hospice care.  Anxiety I believe there is a component of anxiety as well driving her respiratory distress.  Currently on maximum dose of Cymbalta  at 60 mg daily, Klonopin  0.5 mg twice daily, Lamictal  200 mg daily and Xanax  0.5 mg TID  CAD, HLD PTA meds- aspirin  81 mg daily,, Crestor  and Imdur   Continue Imdur  and Crestor .  Currently aspirin  is on hold.   Chronic diastolic heart failure Hypertension Euvolemic. PTA meds-Toprol  100 mg daily, amlodipine  5 mg daily, Imdur  30 mg twice daily, Lasix  20 mg daily Currently continued on Toprol , amlodipine , Imdur   In the last few days Linda [name] blood pressure has been trending down and Lasix  has been trending up BP meds held this morning. Continue to monitor.  GERD Continue PPI    Chronic pain syndrome Continue oxycodone   Restless legs Requip    Mobility: Encourage ambulation  Goals of care   Code Status: Limited: Do not attempt resuscitation (DNR) -DNR-LIMITED -Do Not Intubate/DNI      DVT prophylaxis:  enoxaparin  (LOVENOX ) injection 40 mg Start: 12/26/23 1930 SCDs Start:  12/25/23 2315   Antimicrobials: Levaquin  Fluid: None Consultants: Pulmonary Family Communication: None at bedside  Status: Inpatient Level of care:  Progressive   Patient is from: Home Needs to continue in-hospital care: Remains short of breath, continues to have cough. Anticipated d/c to: Unclear at this time.  Pending palliative care follow-up on Monday    Diet:  Diet Order             Diet regular Room service appropriate? Yes; Fluid consistency: Thin  Diet effective now                   Scheduled Meds:  acetaminophen   1,000 mg Oral Once   arformoterol   15 mcg Nebulization BID   benzonatate   200 mg Oral BID   budesonide  (PULMICORT ) nebulizer solution  0.25 mg Nebulization BID   [START ON 01/04/2024] clonazePAM   1 mg Oral q morning   clonazePAM   2 mg Oral QHS   clotrimazole   10 mg Oral 5 X Daily   cyclobenzaprine   5 mg Oral BID   DULoxetine   60 mg Oral Daily   enoxaparin  (LOVENOX ) injection  40 mg Subcutaneous Q24H   famotidine   20 mg Oral QHS   feeding supplement  237 mL Oral BID BM   fluticasone   2 spray Each Nare Daily   gabapentin   400 mg Oral TID   guaiFENesin   600 mg Oral BID   guaiFENesin   10 mL Oral Q6H   isosorbide  mononitrate  30 mg Oral BID   lamoTRIgine   200 mg Oral Daily   levofloxacin   500 mg Oral Daily   lidocaine   5 mL Nebulization BID   loratadine   10 mg Oral Daily   metoprolol  succinate  100 mg Oral Daily   montelukast   10 mg Oral QHS   multivitamin with minerals  1 tablet Oral Daily   pantoprazole   40 mg Oral BID   predniSONE   5 mg Oral Q breakfast   ramelteon   8 mg Oral QHS   revefenacin   175 mcg Nebulization Daily   sodium chloride  flush  3 mL Intravenous Q12H   traZODone   300 mg Oral QHS    PRN meds: acetaminophen  **OR** acetaminophen , HYDROcodone  bit-homatropine, ipratropium-albuterol , menthol -cetylpyridinium, ondansetron  **OR** ondansetron  (ZOFRAN ) IV, oxyCODONE -acetaminophen  **AND** oxyCODONE , polyethylene glycol, rOPINIRole ,  sodium chloride  flush   Infusions:    Antimicrobials: Anti-infectives (From admission, onward)    Start     Dose/Rate Route Frequency Ordered Stop   12/29/23 1345  levofloxacin  (LEVAQUIN ) tablet 500 mg        500 mg Oral Daily 12/29/23 1322 01/08/24 1159   12/28/23 1400  cefTRIAXone  (ROCEPHIN ) 1 g in sodium chloride  0.9 % 100 mL IVPB  Status:  Discontinued        1 g 200 mL/hr over 30 Minutes Intravenous Every 24 hours 12/28/23 1300 12/29/23 1322   12/26/23 2315  azithromycin  (ZITHROMAX ) tablet 500 mg  Status:  Discontinued       Placed in Followed by Linked Group   500 mg Oral Daily 12/25/23 2315 12/25/23 2320   12/26/23 2200  azithromycin  (ZITHROMAX ) tablet 500 mg  Status:  Discontinued        500 mg Oral Daily 12/25/23 2321 12/25/23 2322   12/26/23 2200  azithromycin  (ZITHROMAX ) tablet 500 mg  Status:  Discontinued        500 mg Oral Daily 12/25/23 2322 12/28/23 1300   12/25/23 2314  azithromycin  (ZITHROMAX ) 500  mg in sodium chloride  0.9 % 250 mL IVPB  Status:  Discontinued       Placed in Followed by Linked Group   500 mg 250 mL/hr over 60 Minutes Intravenous Every 24 hours 12/25/23 2315 12/25/23 2320   12/25/23 2030  cefTRIAXone  (ROCEPHIN ) 2 g in sodium chloride  0.9 % 100 mL IVPB  Status:  Discontinued        2 g 200 mL/hr over 30 Minutes Intravenous Every 24 hours 12/25/23 2027 12/27/23 1727   12/25/23 2030  azithromycin  (ZITHROMAX ) 500 mg in sodium chloride  0.9 % 250 mL IVPB  Status:  Discontinued        500 mg 250 mL/hr over 60 Minutes Intravenous Every 24 hours 12/25/23 2027 12/27/23 1108       Objective: Vitals:   01/03/24 0929 01/03/24 1358  BP: (!) 91/56 113/66  Pulse:  77  Resp:  19  Temp:  98 F (36.7 C)  SpO2:  96%    Intake/Output Summary (Last 24 hours) at 01/03/2024 1429 Last data filed at 01/03/2024 0500 Gross per 24 hour  Intake 240 ml  Output 800 ml  Net -560 ml   Filed Weights   12/29/23 0515 01/02/24 0526 01/03/24 0346  Weight: 69.6 kg  71.4 kg 70.8 kg   Weight change: -0.639 kg Body mass index is 26.78 kg/m.   Physical Exam: General exam: Pleasant, elderly Skin: No rashes, lesions or ulcers. HEENT: Atraumatic, normocephalic, no obvious bleeding Lungs: Clear to auscultation bilaterally, transmitted sounds from throat.  Coughs intermittently.  Nonproductive CVS: S1, S2, no murmur,   GI/Abd: Soft, nontender, nondistended, bowel sound present,   CNS: Alert, awake, oriented x 3 Psychiatry: Mood appropriate,  Extremities: No pedal edema, no calf tenderness, arthritic changes in hand  Data Review: I have personally reviewed the laboratory data and studies available.  F/u labs  Unresulted Labs (From admission, onward)    None       Total time spent in review of labs and imaging, patient evaluation, formulation of plan, documentation and communication with family: 45 minutes  Signed, Chapman Rota, MD [facility] 01/03/2024

## 2024-01-03 NOTE — Progress Notes (Signed)
   NAME:  Linda [name], MRN:  [id number], DOB:  Feb 24, 1946, LOS: 9 ADMISSION DATE:  12/25/2023, CONSULTATION DATE:  12/25/23 REFERRING MD:  Dr. Silvester, MD CHIEF COMPLAINT:  asthma exacerbation   History of Present Illness:  Linda [name] is a 78 year old woman with history of restrictive lung disease and chronic hypoxemic respiratory failure in setting of asbesosis with pleural plaques along with asthma who presented to ER with wheezing and dyspnea.   PCCM consulted for asthma exacerbation. She has been given duoneb treatment and steroids. She reports feeling better already but continues to have some work of breathing.   She is baseline on 3L of O2, currently on 4-5L in ER. She denies any sick contacts. Reports some chills denies fevers. She does have cough with sputum production.   She is on 5mg  prednisone  daily.  Pertinent  Medical History  CAD CHF Chronic Back Pain [name] with pleural Plaques Chronic Hypoxemic Respiratory Failure  Significant Hospital Events: Including procedures, antibiotic start and stop dates in addition to other pertinent events   1/2 Admitted to [facility] for asthma exacerbation 1/3 Mild improvement. On baseline O2 with adequate saturation. Still wheezy. 1/4 Reports some improvement in wheezing this am but not back to baseline yet.   Interim History / Subjective:  Slept a little better Klonipin helping some but still anxious Still dyspneic with exertion  Objective   Blood pressure (!) 91/56, pulse 62, temperature 97.9 F (36.6 C), temperature source Oral, resp. rate 19, height 5' 4 (1.626 m), weight 70.8 kg, SpO2 96%.        Intake/Output Summary (Last 24 hours) at 01/03/2024 1341 Last data filed at 01/03/2024 0500 Gross per 24 hour  Intake 240 ml  Output 800 ml  Net -560 ml   Filed Weights   12/29/23 0515 01/02/24 0526 01/03/24 0346  Weight: 69.6 kg 71.4 kg 70.8 kg    Examination: No distress Cough worsened by talking Lungs clear Moves  to command Globally weak   Resolved Hospital Problem list     Assessment & Plan:  Acute on Chronic Hypoxemic Respiratory Failure  3L [place] at baseline  Asthma with Acute Exacerbation (on tespire as outpatient) Restrictive lung disease in setting of asbestosis with pleural plaques FVC 41% in 2015 CAP RLL rounded opacity (probable rounded atelectasis)- increasing on imaging with question of PNA raised although could be increasing pleural plaque vs. mesothelioma GERD Thrush -RVP panel negative, PCT neg VCD possible but less likely as she has had laryngoscopy x 2 She has had SLP evaluations and MBSS without obvious aspiration Prior EGDs with dilations Steroids do not seem to change her disease trajectory much She is nearing maximal GERD, allergy, asthma, UACS tx She has been on strong abx for possible sinus component and CAP. Trial of benzos do not seem to be helping. This is either advancing asbestosis causing pleural irritation or refractory functional cough syndrome  P: levaquin  x 10 days to treat sinusitis and concurrent ?CAP Clotrimazole  troche x 2 more days for thrush and throat irritation Maximal asthma treatment as ordered ; restart PTA pednisone Continue gabapentin , klonipin, increase latter Trial of lidocaine  nebs: helped some will make BID Asking PMT to re-engage Monday, she seems miserable and we have not been able to control her symptoms to date Will have her outpatient doctor H[place] check on Monday to see if anything else we can offer  Rolan Sharps MD PCCM

## 2024-01-03 NOTE — Progress Notes (Signed)
 Mobility Specialist Progress Note;   01/03/24 1512  Mobility  Activity Ambulated with assistance in hallway  Level of Assistance Contact guard assist, steadying assist  Assistive Device Four wheel walker  Distance Ambulated (ft) 400 ft  Activity Response Tolerated well  Mobility Referral Yes  Mobility visit 1 Mobility  Mobility Specialist Start Time (ACUTE ONLY) 1512  Mobility Specialist Stop Time (ACUTE ONLY) 1534  Mobility Specialist Time Calculation (min) (ACUTE ONLY) 22 min   Pt agreeable to mobility. On 3LO2 upon arrival. Required MinG assistance during ambulation for safety. Ambulated on 3LO2, VSS. Pt displayed a cough throughout session, however no other complaints. Pt returned safely back to bed with all needs met, alarm on.   Lauraine Erm Mobility Specialist Please contact via SecureChat or [employer] [phone]

## 2024-01-03 NOTE — Plan of Care (Signed)

## 2024-01-04 DIAGNOSIS — J4541 Moderate persistent asthma with (acute) exacerbation: Secondary | ICD-10-CM

## 2024-01-04 LAB — GLUCOSE, CAPILLARY: Glucose-Capillary: 148 mg/dL — ABNORMAL HIGH (ref 70–99)

## 2024-01-04 NOTE — Plan of Care (Signed)

## 2024-01-04 NOTE — Progress Notes (Signed)
 PROGRESS NOTE  Linda [name]  DOB: [birth year]  PCP: Candise Aleene DEL, MD FMW:[id number]  DOA: 12/25/2023  LOS: 10 days  Hospital Day: 11  Brief narrative: Linda [name] is a 78 y.o. female with PMH significant for HTN, HLD, CHF, chronically on prednisone  and 3 L oxygen  at home due to restrictive lung disease from last month and asbesosis with pleural plaques, esophageal stricture s/p prior dilatation 1/2, patient presented to the ED with complaint of shortness of breath, wheezing, productive cough For asthma, patient was also using Tezspire  subcu q28 days  Found to be in asthma exacerbation CT chest showed extensive bilateral calcified pleural plaques, also showed increasing airspace disease in the medial right lower lobe suspicious for pneumonia RVP negative. COVID-19, influenza and RSV negative.  Procalcitonin negative.  Started on IV Solu-Medrol , azithromycin , bronchodilators as well as empiric antibiotics PCCM consulted 1/5, CT sinus showed findings compatible with acute sinusitis involving bilateral maxillary and sphenoid sinus fluid  Subjective: Patient was seen and examined this morning. Lying down in bed.  Not in distress. Does not feel well. Continues to have cough, intermittently severe.  Patient states he had desaturation as well.  I do not see documentation of that. She asked for stronger cough medicine. Blood pressure continues to remain low and hence blood pressure medicines are on hold.  Assessment and plan: Acute on chronic hypoxic respiratory failure Acute exacerbation of asthma Acute sinusitis Restrictive lung disease in setting of asbestosis with pleural plaques Presented with worsening shortness of breath in the setting of pre-existing asthma, asbestosis. Admitted for asthma exacerbation.  Started on steroids. There was initial suspicion of pneumonia as well and hence she was started on Rocephin  and azithromycin . 1/5, CT sinus showed findings  compatible with acute sinusitis involving bilateral maxillary and sphenoid sinus fluid Pulmonary consultation appreciated.  Per pulmonology note, VCD possible but less likely as he has had laryngoscopy x 2.  Recent SLP evaluations with MBS did not show obvious aspiration. Patient seems to be nearing maximal treatment aimed at asthma, GERD, allergy and UACS. PCCM recommended an empiric 10-day course of Levaquin  to treat sinusitis, to complete on 1/16. Home dose of prednisone  5 mg daily resumed Continue Magic mouthwash for thrush and throat irritation. Continue gabapentin  to help with cough suppression Continue bronchodilators, Claritin , Singulair  As recommended by PCCM and speech therapist, I got in touch with ENT Dr. Soldatova on 01/01/2024.  Recommended outpatient follow-up. Palliative care to follow-up on Monday regarding the recommendation of hospice care.  Anxiety I believe there is a component of anxiety as well driving her respiratory distress.  Currently on maximum dose of Cymbalta  at 60 mg daily, Klonopin  0.5 mg twice daily, Lamictal  200 mg daily and Xanax  0.5 mg TID  CAD, HLD PTA meds- aspirin  81 mg daily,, Crestor  and Imdur   Continue Imdur  and Crestor .  Currently aspirin  is on hold.   Chronic diastolic heart failure Hypertension Euvolemic. PTA meds-Toprol  100 mg daily, amlodipine  5 mg daily, Imdur  30 mg twice daily, Lasix  20 mg daily Currently continued on Toprol , amlodipine , Imdur   In the last few days patient's blood pressure has been trending down All 3 blood pressure medicines are on hold Continue to monitor.  GERD Continue PPI    Chronic pain syndrome Continue oxycodone   Restless legs Requip    Mobility: Encourage ambulation  Goals of care   Code Status: Limited: Do not attempt resuscitation (DNR) -DNR-LIMITED -Do Not Intubate/DNI      DVT prophylaxis:  enoxaparin  (LOVENOX ) injection 40  mg Start: 12/26/23 1930 SCDs Start: 12/25/23 2315   Antimicrobials:  Levaquin  Fluid: None Consultants: Pulmonary Family Communication: None at bedside  Status: Inpatient Level of care:  Progressive   Patient is from: Home Needs to continue in-hospital care: Remains short of breath, continues to have cough. Anticipated d/c to: Unclear at this time.  Pending palliative care follow-up on Monday    Diet:  Diet Order             Diet regular Room service appropriate? Yes; Fluid consistency: Thin  Diet effective now                   Scheduled Meds:  acetaminophen   1,000 mg Oral Once   arformoterol   15 mcg Nebulization BID   benzonatate   200 mg Oral BID   budesonide  (PULMICORT ) nebulizer solution  0.25 mg Nebulization BID   clonazePAM   1 mg Oral q morning   clonazePAM   2 mg Oral QHS   clotrimazole   10 mg Oral 5 X Daily   cyclobenzaprine   5 mg Oral BID   DULoxetine   60 mg Oral Daily   enoxaparin  (LOVENOX ) injection  40 mg Subcutaneous Q24H   famotidine   20 mg Oral QHS   feeding supplement  237 mL Oral BID BM   fluticasone   2 spray Each Nare Daily   gabapentin   400 mg Oral TID   guaiFENesin   600 mg Oral BID   guaiFENesin   10 mL Oral Q6H   lamoTRIgine   200 mg Oral Daily   levofloxacin   500 mg Oral Daily   lidocaine   5 mL Nebulization BID   loratadine   10 mg Oral Daily   montelukast   10 mg Oral QHS   multivitamin with minerals  1 tablet Oral Daily   pantoprazole   40 mg Oral BID   predniSONE   5 mg Oral Q breakfast   ramelteon   8 mg Oral QHS   revefenacin   175 mcg Nebulization Daily   sodium chloride  flush  3 mL Intravenous Q12H   traZODone   300 mg Oral QHS    PRN meds: acetaminophen  **OR** acetaminophen , HYDROcodone  bit-homatropine, ipratropium-albuterol , menthol -cetylpyridinium, ondansetron  **OR** ondansetron  (ZOFRAN ) IV, oxyCODONE -acetaminophen  **AND** oxyCODONE , polyethylene glycol, rOPINIRole , sodium chloride  flush   Infusions:    Antimicrobials: Anti-infectives (From admission, onward)    Start     Dose/Rate Route Frequency  Ordered Stop   12/29/23 1345  levofloxacin  (LEVAQUIN ) tablet 500 mg        500 mg Oral Daily 12/29/23 1322 01/08/24 1159   12/28/23 1400  cefTRIAXone  (ROCEPHIN ) 1 g in sodium chloride  0.9 % 100 mL IVPB  Status:  Discontinued        1 g 200 mL/hr over 30 Minutes Intravenous Every 24 hours 12/28/23 1300 12/29/23 1322   12/26/23 2315  azithromycin  (ZITHROMAX ) tablet 500 mg  Status:  Discontinued       Placed in Followed by Linked Group   500 mg Oral Daily 12/25/23 2315 12/25/23 2320   12/26/23 2200  azithromycin  (ZITHROMAX ) tablet 500 mg  Status:  Discontinued        500 mg Oral Daily 12/25/23 2321 12/25/23 2322   12/26/23 2200  azithromycin  (ZITHROMAX ) tablet 500 mg  Status:  Discontinued        500 mg Oral Daily 12/25/23 2322 12/28/23 1300   12/25/23 2314  azithromycin  (ZITHROMAX ) 500 mg in sodium chloride  0.9 % 250 mL IVPB  Status:  Discontinued       Placed in Followed by Linked Group  500 mg 250 mL/hr over 60 Minutes Intravenous Every 24 hours 12/25/23 2315 12/25/23 2320   12/25/23 2030  cefTRIAXone  (ROCEPHIN ) 2 g in sodium chloride  0.9 % 100 mL IVPB  Status:  Discontinued        2 g 200 mL/hr over 30 Minutes Intravenous Every 24 hours 12/25/23 2027 12/27/23 1727   12/25/23 2030  azithromycin  (ZITHROMAX ) 500 mg in sodium chloride  0.9 % 250 mL IVPB  Status:  Discontinued        500 mg 250 mL/hr over 60 Minutes Intravenous Every 24 hours 12/25/23 2027 12/27/23 1108       Objective: Vitals:   01/04/24 0718 01/04/24 0739  BP:  97/67  Pulse: 62 69  Resp: 18 18  Temp:  98.2 F (36.8 C)  SpO2:  98%    Intake/Output Summary (Last 24 hours) at 01/04/2024 1039 Last data filed at 01/04/2024 0550 Gross per 24 hour  Intake 360 ml  Output 1500 ml  Net -1140 ml   Filed Weights   01/02/24 0526 01/03/24 0346 01/04/24 0536  Weight: 71.4 kg 70.8 kg 70.2 kg   Weight change: -0.59 kg Body mass index is 26.55 kg/m.   Physical Exam: General exam: Pleasant, elderly Skin: No  rashes, lesions or ulcers. HEENT: Atraumatic, normocephalic, no obvious bleeding Lungs: Clear to auscultation bilaterally, transmitted sounds from throat.  Coughs intermittently.  Nonproductive CVS: S1, S2, no murmur,   GI/Abd: Soft, nontender, nondistended, bowel sound present,   CNS: Alert, awake, oriented x 3 Psychiatry: Mood appropriate,  Extremities: No pedal edema, no calf tenderness, arthritic changes in hand  Data Review: I have personally reviewed the laboratory data and studies available.  F/u labs  Unresulted Labs (From admission, onward)    None       Total time spent in review of labs and imaging, patient evaluation, formulation of plan, documentation and communication with family: 25 minutes  Signed, Chapman Rota, MD [facility] 01/04/2024

## 2024-01-04 NOTE — Progress Notes (Signed)
   01/04/24 1605  Mobility  Activity Ambulated with assistance in hallway  Level of Assistance Contact guard assist, steadying assist  Assistive Device Front wheel walker  Distance Ambulated (ft) 450 ft  Activity Response Tolerated fair  Mobility Referral Yes  Mobility visit 1 Mobility  Mobility Specialist Start Time (ACUTE ONLY) 1605  Mobility Specialist Stop Time (ACUTE ONLY) 1640  Mobility Specialist Time Calculation (min) (ACUTE ONLY) 35 min   Mobility Specialist: Progress Note  Pre-Mobility: HR 75,SpO2 92% 3L During Mobility:  SpO2 96% 3L Post-Mobility: SpO2 95% 3L  Pt agreeable to mobility session - received in bed. Required CG using Rollator. C/o back pain, BLE knee pain, rib pain, consistent cough, headache, pt with blood from nose after wiping her nose -RN notified. Returned to bed with all needs met - call bell within reach. RN present.   Linda [name], BS Mobility Specialist Please contact via SecureChat or Rehab office at [phone].

## 2024-01-05 DIAGNOSIS — J9621 Acute and chronic respiratory failure with hypoxia: Secondary | ICD-10-CM | POA: Diagnosis not present

## 2024-01-05 DIAGNOSIS — Z515 Encounter for palliative care: Secondary | ICD-10-CM | POA: Diagnosis not present

## 2024-01-05 DIAGNOSIS — J4541 Moderate persistent asthma with (acute) exacerbation: Secondary | ICD-10-CM | POA: Diagnosis not present

## 2024-01-05 DIAGNOSIS — R059 Cough, unspecified: Secondary | ICD-10-CM | POA: Diagnosis not present

## 2024-01-05 DIAGNOSIS — Z7189 Other specified counseling: Secondary | ICD-10-CM | POA: Diagnosis not present

## 2024-01-05 LAB — GLUCOSE, CAPILLARY
Glucose-Capillary: 82 mg/dL (ref 70–99)
Glucose-Capillary: 123 mg/dL — ABNORMAL HIGH (ref 70–99)
Glucose-Capillary: 182 mg/dL — ABNORMAL HIGH (ref 70–99)
Glucose-Capillary: 162 mg/dL — ABNORMAL HIGH (ref 70–99)

## 2024-01-05 MED ORDER — OXYCODONE HCL 5 MG PO TABS
7.5000 mg | ORAL_TABLET | ORAL | Status: DC | PRN
  Administered 2024-01-05 – 2024-01-08 (×12): 7.5 mg via ORAL
  Filled 2024-01-05 (×12): qty 2

## 2024-01-05 MED ORDER — HYDROCODONE BIT-HOMATROP MBR 5-1.5 MG/5ML PO SOLN
15.0000 mL | Freq: Four times a day (QID) | ORAL | Status: DC | PRN
  Administered 2024-01-05 – 2024-01-08 (×9): 15 mL via ORAL
  Filled 2024-01-05 (×10): qty 15

## 2024-01-05 MED ORDER — BOOST / RESOURCE BREEZE PO LIQD CUSTOM
1.0000 | Freq: Three times a day (TID) | ORAL | Status: DC
  Administered 2024-01-05 – 2024-01-09 (×11): 1 via ORAL

## 2024-01-05 MED ORDER — OXYCODONE-ACETAMINOPHEN 5-325 MG PO TABS
1.0000 | ORAL_TABLET | ORAL | Status: DC | PRN
  Administered 2024-01-05 – 2024-01-08 (×12): 1 via ORAL
  Filled 2024-01-05 (×12): qty 1

## 2024-01-05 NOTE — Progress Notes (Signed)
   NAME:  Linda [name], MRN:  [id number], DOB:  08-29-1946, LOS: 11 ADMISSION DATE:  12/25/2023, CONSULTATION DATE:  12/25/23 REFERRING MD:  Dr. Silvester, MD CHIEF COMPLAINT:  asthma exacerbation   History of Present Illness:  Linda [name] is a 78 year old woman with history of restrictive lung disease and chronic hypoxemic respiratory failure in setting of asbesosis with pleural plaques along with asthma who presented to ER with wheezing and dyspnea.   PCCM consulted for asthma exacerbation. She has been given duoneb treatment and steroids. She reports feeling better already but continues to have some work of breathing.   She is baseline on 3L of O2, currently on 4-5L in ER. She denies any sick contacts. Reports some chills denies fevers. She does have cough with sputum production.   She is on 5mg  prednisone  daily.  Pertinent  Medical History  CAD CHF Chronic Back Pain [name] with pleural Plaques Chronic Hypoxemic Respiratory Failure  Significant Hospital Events: Including procedures, antibiotic start and stop dates in addition to other pertinent events   1/2 Admitted to [facility] for asthma exacerbation 1/3 Mild improvement. On baseline O2 with adequate saturation. Still wheezy. 1/4 Reports some improvement in wheezing this am but not back to baseline yet.   Interim History / Subjective:  Slept a little better Klonipin helping some but still anxious Still dyspneic with exertion  Objective   Blood pressure (!) 105/55, pulse 68, temperature 97.7 F (36.5 C), temperature source Oral, resp. rate 17, height 5' 4 (1.626 m), weight 69.8 kg, SpO2 92%.        Intake/Output Summary (Last 24 hours) at 01/05/2024 1739 Last data filed at 01/05/2024 0854 Gross per 24 hour  Intake 677 ml  Output 2120 ml  Net -1443 ml   Filed Weights   01/03/24 0346 01/04/24 0536 01/05/24 0559  Weight: 70.8 kg 70.2 kg 69.8 kg    Examination: No distress Cough worsened by talking Lungs  clear Moves to command Globally weak   Resolved Hospital Problem list     Assessment & Plan:  Acute on Chronic Hypoxemic Respiratory Failure  3L [place] at baseline  Asthma with Acute Exacerbation (on tespire as outpatient) Restrictive lung disease in setting of asbestosis with pleural plaques FVC 41% in 2015 CAP RLL rounded opacity (probable rounded atelectasis)- increasing on imaging with question of PNA raised although could be increasing pleural plaque vs. mesothelioma however recent negative PET scan is reassuring GERD Thrush RVP panel negative, PCT neg VCD possible but less likely as she has had laryngoscopy x 2 She has had SLP evaluations and MBSS without obvious aspiration Prior EGDs with dilations Steroids do not seem to change her disease trajectory much She is nearing maximal GERD, allergy, asthma, UACS tx She has been on strong abx for possible sinus component and CAP. Trial of benzos do not seem to be helping. All evidence points to worsening as best doses causing parenchymal irritation, air irritation and cough  P: Finish 10 day course of Levaquin  On maximal medical therapy for asthma, no real improvement Recommend minimizing steroids in the future, currently on 5 mg daily Recommend palliation of cough with opiate medications, increase dose of Hycodan cough syrup 1/13 Given chronic hypoxemia and worsening symptom burden, hospice services are reasonable  Linda JONELLE Beals, MD See Linda [name]

## 2024-01-05 NOTE — Progress Notes (Signed)
 PROGRESS NOTE  Linda [name]  DOB: 1946/05/04  PCP: Candise Aleene DEL, MD FMW:[id number]  DOA: 12/25/2023  LOS: 11 days  Hospital Day: 12  Brief narrative: Linda [name] is a 78 y.o. female with PMH significant for HTN, HLD, CHF, chronically on prednisone  and 3 L oxygen  at home due to restrictive lung disease from last month and asbesosis with pleural plaques, esophageal stricture s/p prior dilatation 1/2, patient presented to the ED with complaint of shortness of breath, wheezing, productive cough For asthma, patient was also using Tezspire  subcu q28 days  Found to be in asthma exacerbation CT chest showed extensive bilateral calcified pleural plaques, also showed increasing airspace disease in the medial right lower lobe suspicious for pneumonia RVP negative. COVID-19, influenza and RSV negative.  Procalcitonin negative.  Started on IV Solu-Medrol , azithromycin , bronchodilators as well as empiric antibiotics PCCM consulted 1/5, CT sinus showed findings compatible with acute sinusitis involving bilateral maxillary and sphenoid sinus fluid  Subjective: Patient was seen and examined this morning. Was getting ready to work with mobility.  She was able to walk 400 feet, O2 sat remained 93 to 96% without interruptions.  Chronic cough present. Pending evaluation by PT today.  Assessment and plan: Acute on chronic hypoxic respiratory failure Acute exacerbation of asthma Acute sinusitis Restrictive lung disease in setting of asbestosis with pleural plaques Presented with worsening shortness of breath in the setting of pre-existing asthma, asbestosis. Admitted for asthma exacerbation.  Started on steroids. There was initial suspicion of pneumonia as well and hence she was started on Rocephin  and azithromycin . 1/5, CT sinus showed findings compatible with acute sinusitis involving bilateral maxillary and sphenoid sinus fluid Pulmonary consultation appreciated.  Per pulmonology  note, VCD possible but less likely as he has had laryngoscopy x 2.  Recent SLP evaluations with MBS did not show obvious aspiration. Patient seems to be nearing maximal treatment aimed at asthma, GERD, allergy and UACS. PCCM recommended an empiric 10-day course of Levaquin  to treat sinusitis, to complete on 1/16. Home dose of prednisone  5 mg daily to continue Continue Magic mouthwash for thrush and throat irritation. Continue gabapentin  to help with cough suppression Continue bronchodilators, Claritin , Singulair  As recommended by PCCM and speech therapist, I got in touch with ENT Dr. Soldatova on 01/01/2024.  Recommended outpatient follow-up. Palliative care to follow-up on Monday regarding the recommendation of hospice care.  Anxiety I believe there is a component of anxiety as well driving her respiratory distress.  Currently on maximum dose of Cymbalta  at 60 mg daily, Klonopin  0.5 mg twice daily, Lamictal  200 mg daily and Xanax  0.5 mg TID  CAD, HLD PTA meds- aspirin  81 mg daily,, Crestor  and Imdur   Continue Imdur  and Crestor .  Currently aspirin  is on hold.   Chronic diastolic heart failure Hypertension Euvolemic. PTA meds-Toprol  100 mg daily, amlodipine  5 mg daily, Imdur  30 mg twice daily, Lasix  20 mg daily Currently continued on Toprol , amlodipine , Imdur   In the last few days patient's blood pressure has been trending down All 3 blood pressure medicines are on hold Continue to monitor.  GERD Continue PPI    Chronic pain syndrome Continue oxycodone   Restless legs Requip    Mobility: Encourage ambulation  Goals of care   Code Status: Limited: Do not attempt resuscitation (DNR) -DNR-LIMITED -Do Not Intubate/DNI      DVT prophylaxis:  enoxaparin  (LOVENOX ) injection 40 mg Start: 12/26/23 1930 SCDs Start: 12/25/23 2315   Antimicrobials: Levaquin  Fluid: None Consultants: Pulmonary Family Communication: None at bedside  Status: Inpatient Level of care:  Progressive    Patient is from: Home Needs to continue in-hospital care: Continues to complain of short of breath, continues to have cough. Anticipated d/c to: Unclear at this time.  Pending palliative care follow-up today.     Diet:  Diet Order             Diet regular Room service appropriate? Yes; Fluid consistency: Thin  Diet effective now                   Scheduled Meds:  acetaminophen   1,000 mg Oral Once   arformoterol   15 mcg Nebulization BID   benzonatate   200 mg Oral BID   budesonide  (PULMICORT ) nebulizer solution  0.25 mg Nebulization BID   clonazePAM   1 mg Oral q morning   clonazePAM   2 mg Oral QHS   cyclobenzaprine   5 mg Oral BID   DULoxetine   60 mg Oral Daily   enoxaparin  (LOVENOX ) injection  40 mg Subcutaneous Q24H   famotidine   20 mg Oral QHS   feeding supplement  237 mL Oral BID BM   fluticasone   2 spray Each Nare Daily   gabapentin   400 mg Oral TID   guaiFENesin   600 mg Oral BID   guaiFENesin   10 mL Oral Q6H   lamoTRIgine   200 mg Oral Daily   levofloxacin   500 mg Oral Daily   lidocaine   5 mL Nebulization BID   loratadine   10 mg Oral Daily   montelukast   10 mg Oral QHS   multivitamin with minerals  1 tablet Oral Daily   pantoprazole   40 mg Oral BID   predniSONE   5 mg Oral Q breakfast   ramelteon   8 mg Oral QHS   revefenacin   175 mcg Nebulization Daily   sodium chloride  flush  3 mL Intravenous Q12H   traZODone   300 mg Oral QHS    PRN meds: acetaminophen  **OR** acetaminophen , HYDROcodone  bit-homatropine, ipratropium-albuterol , menthol -cetylpyridinium, ondansetron  **OR** ondansetron  (ZOFRAN ) IV, oxyCODONE -acetaminophen  **AND** oxyCODONE , polyethylene glycol, rOPINIRole , sodium chloride  flush   Infusions:    Antimicrobials: Anti-infectives (From admission, onward)    Start     Dose/Rate Route Frequency Ordered Stop   12/29/23 1345  levofloxacin  (LEVAQUIN ) tablet 500 mg        500 mg Oral Daily 12/29/23 1322 01/08/24 1159   12/28/23 1400  cefTRIAXone   (ROCEPHIN ) 1 g in sodium chloride  0.9 % 100 mL IVPB  Status:  Discontinued        1 g 200 mL/hr over 30 Minutes Intravenous Every 24 hours 12/28/23 1300 12/29/23 1322   12/26/23 2315  azithromycin  (ZITHROMAX ) tablet 500 mg  Status:  Discontinued       Placed in Followed by Linked Group   500 mg Oral Daily 12/25/23 2315 12/25/23 2320   12/26/23 2200  azithromycin  (ZITHROMAX ) tablet 500 mg  Status:  Discontinued        500 mg Oral Daily 12/25/23 2321 12/25/23 2322   12/26/23 2200  azithromycin  (ZITHROMAX ) tablet 500 mg  Status:  Discontinued        500 mg Oral Daily 12/25/23 2322 12/28/23 1300   12/25/23 2314  azithromycin  (ZITHROMAX ) 500 mg in sodium chloride  0.9 % 250 mL IVPB  Status:  Discontinued       Placed in Followed by Linked Group   500 mg 250 mL/hr over 60 Minutes Intravenous Every 24 hours 12/25/23 2315 12/25/23 2320   12/25/23 2030  cefTRIAXone  (ROCEPHIN ) 2 g in sodium chloride   0.9 % 100 mL IVPB  Status:  Discontinued        2 g 200 mL/hr over 30 Minutes Intravenous Every 24 hours 12/25/23 2027 12/27/23 1727   12/25/23 2030  azithromycin  (ZITHROMAX ) 500 mg in sodium chloride  0.9 % 250 mL IVPB  Status:  Discontinued        500 mg 250 mL/hr over 60 Minutes Intravenous Every 24 hours 12/25/23 2027 12/27/23 1108       Objective: Vitals:   01/05/24 1047 01/05/24 1226  BP: 108/76   Pulse: 69 81  Resp: 18 18  Temp: 97.9 F (36.6 C)   SpO2: 92% 94%    Intake/Output Summary (Last 24 hours) at 01/05/2024 1346 Last data filed at 01/05/2024 0854 Gross per 24 hour  Intake 677 ml  Output 2120 ml  Net -1443 ml   Filed Weights   01/03/24 0346 01/04/24 0536 01/05/24 0559  Weight: 70.8 kg 70.2 kg 69.8 kg   Weight change: -0.371 kg Body mass index is 26.41 kg/m.   Physical Exam: General exam: Pleasant, elderly Skin: No rashes, lesions or ulcers. HEENT: Atraumatic, normocephalic, no obvious bleeding Lungs: Clear to auscultation bilaterally, transmitted sounds from  throat.  Coughs intermittently.  Nonproductive CVS: S1, S2, no murmur,   GI/Abd: Soft, nontender, nondistended, bowel sound present,   CNS: Alert, awake, oriented x 3 Psychiatry: Mood appropriate,  Extremities: No pedal edema, no calf tenderness, arthritic changes in hand  Data Review: I have personally reviewed the laboratory data and studies available.  F/u labs  Unresulted Labs (From admission, onward)    None       Total time spent in review of labs and imaging, patient evaluation, formulation of plan, documentation and communication with family: 25 minutes  Signed, Chapman Rota, MD [facility] 01/05/2024

## 2024-01-05 NOTE — Progress Notes (Signed)
 Occupational Therapy Treatment Patient Details Name: Linda [name] MRN: [id number] DOB: 1946-09-21 Today's Date: 01/05/2024   History of present illness Pt is a 78 y.o. female who presents 12/25/23 with asthma exacerbation. PMH - chronic bil knee pain due to osteoarthritis, chronic low back pain, anxiety/depression, osteoporosis, chf, copd, htn, fibromyalgia, PE.   OT comments  Pt making progress with functional goals. Pt with persistent coughing throughout session, O2 SATs 89-95% during in room activity. OT will continue to follow acutely to maximize level of function and safety      If plan is discharge home, recommend the following:  A little help with walking and/or transfers;A little help with bathing/dressing/bathroom;Assistance with cooking/housework;Assist for transportation   Equipment Recommendations  BSC/3in1    Recommendations for Other Services      Precautions / Restrictions Precautions Precautions: Fall Precaution Comments: 3L O2 at all times Restrictions Weight Bearing Restrictions Per Provider Order: No       Mobility Bed Mobility Overal bed mobility: Modified Independent                  Transfers Overall transfer level: Needs assistance Equipment used: None Transfers: Sit to/from Stand, Bed to chair/wheelchair/BSC Sit to Stand: Supervision     Step pivot transfers: Supervision     General transfer comment: supervision for safety with and without AD     Balance Overall balance assessment: Mild deficits observed, not formally tested                                         ADL either performed or assessed with clinical judgement   ADL Overall ADL's : Needs assistance/impaired     Grooming: Wash/dry hands;Wash/dry face;Supervision/safety;Standing                   Toilet Transfer: Supervision/safety;Ambulation;BSC/3in1   Toileting- Architect and Hygiene: Supervision/safety;Sit to/from stand        Functional mobility during ADLs: Supervision/safety General ADL Comments: supervision for ADL mobility with O2 SATs 10%-04% with activities on 3L O2    Extremity/Trunk Assessment Upper Extremity Assessment Upper Extremity Assessment: Generalized weakness   Lower Extremity Assessment Lower Extremity Assessment: Defer to PT evaluation        Vision Baseline Vision/History: 1 Wears glasses Ability to See in Adequate Light: 0 Adequate Patient Visual Report: No change from baseline     Perception     Praxis      Cognition Arousal: Alert Behavior During Therapy: WFL for tasks assessed/performed Overall Cognitive Status: Within Functional Limits for tasks assessed                                          Exercises      Shoulder Instructions       General Comments      Pertinent Vitals/ Pain       Pain Assessment Pain Assessment: No/denies pain Pain Score: 0-No pain Pain Intervention(s): Monitored during session, Repositioned  Home Living                                          Prior Functioning/Environment  Frequency  Min 1X/week        Progress Toward Goals  OT Goals(current goals can now be found in the care plan section)  Progress towards OT goals: Progressing toward goals     Plan      Co-evaluation                 AM-PAC OT 6 Clicks Daily Activity     Outcome Measure   Help from another person eating meals?: None Help from another person taking care of personal grooming?: A Little Help from another person toileting, which includes using toliet, bedpan, or urinal?: A Little Help from another person bathing (including washing, rinsing, drying)?: A Little Help from another person to put on and taking off regular upper body clothing?: A Little Help from another person to put on and taking off regular lower body clothing?: A Little 6 Click Score: 19    End of Session Equipment  Utilized During Treatment: Gait belt  OT Visit Diagnosis: Muscle weakness (generalized) (M62.81);Pain   Activity Tolerance Patient tolerated treatment well   Patient Left in bed;with call bell/phone within reach   Nurse Communication          Time: 8883-8856 OT Time Calculation (min): 27 min  Charges: OT General Charges $OT Visit: 1 Visit OT Treatments $Self Care/Home Management : 8-22 mins $Therapeutic Activity: 8-22 mins    Jacques Karna Loose 01/05/2024, 1:24 PM

## 2024-01-05 NOTE — Plan of Care (Signed)
  Problem: Health Behavior/Discharge Planning: Goal: Ability to manage health-related needs will improve Outcome: Progressing   Problem: Pain Management: Goal: General experience of comfort will improve Outcome: Progressing   Problem: Safety: Goal: Ability to remain free from injury will improve Outcome: Progressing   Problem: Skin Integrity: Goal: Risk for impaired skin integrity will decrease Outcome: Progressing

## 2024-01-05 NOTE — Progress Notes (Addendum)
 Nutrition Follow-up  DOCUMENTATION CODES:   Non-severe (moderate) malnutrition in context of acute illness/injury  INTERVENTION:  Liberalize diet Boost Breeze po TID, each supplement provides 250 kcal and 9 grams of protein  Multivitamin with minerals  NUTRITION DIAGNOSIS:   Moderate Malnutrition related to acute illness as evidenced by mild fat depletion, moderate muscle depletion, energy intake < 75% for > 7 days - updated s/p NFPE  GOAL:  Patient will meet greater than or equal to 90% of their needs - progressing   MONITOR:  PO intake, Supplement acceptance  REASON FOR ASSESSMENT:  Consult Assessment of nutrition requirement/status  ASSESSMENT:   78 y.o. F, Presented from independent living facility via EMS, with complaints of. Wheezing, shortness of breath and coughing. Admitted with Severe persistent asthma/COPD pulmonary asbestosis with exacerbation.  PMH: HTN, fibromyalgia, syncope, DDD, anxiety, CHF, MDD, CAD.   Averaging 73% meal intake x 8 meals documented. Diet liberalized to encourage PO intake on 01/05. Some constipation noted. Miralax  ordered. She does not like Ensure Enlive. Will modify to [employer].   Pt with labored breathing during bedside. Of note, 24-hour recall not completed as she became exasperated with speaking. C/o pain to BLEs. Described it as constant aching.    Admit Weight: 70.9kg Current Weight: 69.8kg  Weight remains stable with not edema noted. She states her weight has been increasing over the last year r/t prednisone  rx.  Per chart review, wt has ranged between 73.8kg in September of 2024 (5% wt loss).   Intake/Output Summary (Last 24 hours) at 01/05/2024 1503 Last data filed at 01/05/2024 9145 Gross per 24 hour  Intake 677 ml  Output 2120 ml  Net -1443 ml     Labs: CBGs 118-160mg /dL Sed Rate 41  Meds: pantoprazole , famotidine , ABX, MVI, prednisone , Miralax   NUTRITION - FOCUSED PHYSICAL EXAM: Flowsheet Row Most Recent Value   Orbital Region Mild depletion  Upper Arm Region Moderate depletion  Thoracic and Lumbar Region Mild depletion  Buccal Region Moderate depletion  Clavicle Bone Region Moderate depletion  Clavicle and Acromion Bone Region Moderate depletion  Scapular Bone Region Moderate depletion  Dorsal Hand Moderate depletion  Patellar Region Severe depletion  Anterior Thigh Region Severe depletion  Posterior Calf Region Moderate depletion  Edema (RD Assessment) Mild  Hair Reviewed  Eyes Reviewed  Mouth Reviewed  Skin Reviewed  Nails Reviewed    Diet Order:   Diet Order             Diet regular Room service appropriate? Yes; Fluid consistency: Thin  Diet effective now             EDUCATION NEEDS:   No education needs have been identified at this time  Skin:  Skin Assessment: Reviewed RN Assessment  Last BM:  01/10  Height:  Ht Readings from Last 1 Encounters:  12/26/23 5' 4 (1.626 m)    Weight:  Wt Readings from Last 1 Encounters:  01/05/24 69.8 kg    Ideal Body Weight:  54.5 kg  BMI:  Body mass index is 26.41 kg/m.  Estimated Nutritional Needs:   Kcal:  1800-2000kcal  Protein:  95-105g  Fluid:  1.8-2.0L/day   Blair Deaner MS, RD, LDN Registered Dietitian Clinical Nutrition RD Inpatient Contact Info in Amion

## 2024-01-05 NOTE — Progress Notes (Signed)
 Palliative:  HPI:  78 y.o. female  with past medical history of asthma, asbestosis, diastolic CHF, chronic back pain, CAD, and chronic respiratory failure on 3L [place] admitted on 12/25/2023 from independent living with wheezing, SOB, and cough. Being treated for acute on chronic hypoxemic respiratory failure in setting of asbesosis with pleural plaques along with asthma.  I met today with Linda [name]. She is sitting in bed. Lunch tray in front of her. She is having some trouble with swallowing and takes her energy to try and eat. She reports a feeling of something in throat making it difficult to swallow. We discussed the struggles of coughing, breathing, and effective swallowing. She shares that cough and the increased pain from coughing has been debilitating. She reports that pain medication takes the edge off. We discussed need for increase in opioid for improved pain and cough control and she agrees. She reports she has been on this dose of pain medication for ~8 years now. She agrees with increasing with hopes of improvement of pain and cough. She is open to ongoing titration and any other medications/adjustments to provide her with further relief.   I discussed with [name] progression of disease and symptoms. We discussed her living situation. She lives in independent living. She can have assistance from privately hired caregiver help. She feels she can continue to make do at home. She has been walking with mobility specialist. She feels she can continue to manage her medications as well. I discussed further with her outpatient support from palliative vs hospice care. We both agree that hospice support would be much more beneficial to her at this stage of her illness and symptom burden. We did discuss the benefits of hospice support at home and she agrees that this would be helpful for her.   All questions/concerns addressed. Emotional support provided.   Time spent reviewing medications and  collaborating with Dr. Annella who plans to see later today.   Exam: Awake, alert, oriented. Pale, frail. Poor reserve. + congested cough. Abd soft. Moves all extremities.   Plan: - DNR/DNI - Open to hospice at home for further support - Cough:  - Increase oxycodone  12.5 mg q4h PRN (help with pain + cough). Previously 10 q6h PRN.   - Consider hycosamine or scopolomine  - Consider morphine  nebs 10-15 mg BID (may also consider systemic morphine  if oxycodone  increase ineffective) - Continue hycodan syrup  - Continue gabapentin   - Continue Mucinex   - Continue Robitussin  - Continue lidocaine  nebs  - Continue tessalon   - Continue clonazepam   65 min  Bernarda Kitty, NP Palliative Medicine Team Pager [phone] (Please see [email or web address] for schedule) Team Phone [phone]

## 2024-01-05 NOTE — Progress Notes (Signed)
 Mobility Specialist Progress Note:    01/05/24 1152  Mobility  Activity Ambulated with assistance in hallway  Level of Assistance Contact guard assist, steadying assist  Assistive Device Four wheel walker  Distance Ambulated (ft) 400 ft  Activity Response Tolerated well  Mobility Referral Yes  Mobility visit 1 Mobility  Mobility Specialist Start Time (ACUTE ONLY) 0940  Mobility Specialist Stop Time (ACUTE ONLY) 1004  Mobility Specialist Time Calculation (min) (ACUTE ONLY) 24 min   Pt received in bed agreeable to mobility. No physical assistance throughout session. Pt ambulated on 3L/min Spo2 remained between 93%-96%. Returned to room w/o fault. Call bell and personal belongings in reach. Left supine in bed. All needs met. Left on 3L/min.   Thersia Minder Mobility Specialist  Please contact vis Secure Chat or  Rehab Office [phone]

## 2024-01-05 NOTE — Plan of Care (Signed)

## 2024-01-06 DIAGNOSIS — E440 Moderate protein-calorie malnutrition: Secondary | ICD-10-CM | POA: Insufficient documentation

## 2024-01-06 DIAGNOSIS — J9621 Acute and chronic respiratory failure with hypoxia: Secondary | ICD-10-CM | POA: Diagnosis not present

## 2024-01-06 LAB — GLUCOSE, CAPILLARY: Glucose-Capillary: 102 mg/dL — ABNORMAL HIGH (ref 70–99)

## 2024-01-06 MED ORDER — SCOPOLAMINE 1 MG/3DAYS TD PT72
1.0000 | MEDICATED_PATCH | TRANSDERMAL | Status: DC
  Administered 2024-01-06: 1.5 mg via TRANSDERMAL
  Filled 2024-01-06: qty 1

## 2024-01-06 MED ORDER — POLYETHYLENE GLYCOL 3350 17 G PO PACK
17.0000 g | PACK | Freq: Every day | ORAL | Status: DC
  Administered 2024-01-06 – 2024-01-09 (×4): 17 g via ORAL
  Filled 2024-01-06 (×4): qty 1

## 2024-01-06 NOTE — Progress Notes (Signed)
 Palliative:  I discussed with Dr. Annella as well as bedside RN. Reviewed chart and med rec. I went to visit with Linda [name]. She is peacefully asleep and did not awaken to my knock of entrance to her room. I did not awake her further. I will follow up again tomorrow.   No charge.   Bernarda Kitty, NP Palliative Medicine Team Pager [phone] (Please see [email or web address] for schedule) Team Phone [phone]

## 2024-01-06 NOTE — Progress Notes (Signed)
   NAME:  Linda [name], MRN:  [id number], DOB:  05-27-1946, LOS: 12 ADMISSION DATE:  12/25/2023, CONSULTATION DATE:  12/25/23 REFERRING MD:  Dr. Silvester, MD CHIEF COMPLAINT:  asthma exacerbation   History of Present Illness:  Linda [name] is a 78 year old woman with history of restrictive lung disease and chronic hypoxemic respiratory failure in setting of asbesosis with pleural plaques along with asthma who presented to ER with wheezing and dyspnea.   PCCM consulted for asthma exacerbation. She has been given duoneb treatment and steroids. She reports feeling better already but continues to have some work of breathing.   She is baseline on 3L of O2, currently on 4-5L in ER. She denies any sick contacts. Reports some chills denies fevers. She does have cough with sputum production.   She is on 5mg  prednisone  daily.  Pertinent  Medical History  CAD CHF Chronic Back Pain [name] with pleural [place] Chronic Hypoxemic Respiratory Failure  Significant Hospital Events: Including procedures, antibiotic start and stop dates in addition to other pertinent events   1/2 Admitted to [facility] for asthma exacerbation 1/3 Mild improvement. On baseline O2 with adequate saturation. Still wheezy. 1/4 Reports some improvement in wheezing this am but not back to baseline yet.   Interim History / Subjective:  Reports not breathing any better Objective   Blood pressure (!) 119/101, pulse 72, temperature 97.7 F (36.5 C), temperature source Oral, resp. rate 20, height 5' 4 (1.626 m), weight 68.2 kg, SpO2 95%.        Intake/Output Summary (Last 24 hours) at 01/06/2024 0929 Last data filed at 01/06/2024 0725 Gross per 24 hour  Intake 350 ml  Output 1800 ml  Net -1450 ml   Filed Weights   01/04/24 0536 01/05/24 0559 01/06/24 0430  Weight: 70.2 kg 69.8 kg 68.2 kg    Examination: No acute distress at rest Cough is no better Abdomen is positive for bowel sounds Extremities warm   Resolved  Hospital Problem list     Assessment & Plan:  Acute on Chronic Hypoxemic Respiratory Failure  3L [place] at baseline  Asthma with Acute Exacerbation (on tespire as outpatient) Restrictive lung disease in setting of asbestosis with pleural plaques FVC 41% in 2015 CAP RLL rounded opacity (probable rounded atelectasis)- increasing on imaging with question of PNA raised although could be increasing pleural plaque vs. mesothelioma however recent negative PET scan is reassuring GERD Thrush RVP panel negative, PCT neg VCD possible but less likely as she has had laryngoscopy x 2 She has had SLP evaluations and MBSS without obvious aspiration Prior EGDs with dilations Steroids do not seem to change her disease trajectory much She is nearing maximal GERD, allergy, asthma, UACS tx She has been on strong abx for possible sinus component and CAP. Trial of benzos do not seem to be helping. All evidence points to worsening as best doses causing parenchymal irritation, air irritation and cough  P: Complete 10-day course of Levaquin  Not responding to current interventions well Palliation of cough with opiate medications Suspect hospice should now be involved  Marcey Trent Theisen ACNP Acute Care Nurse Practitioner [facility] Please consult Amion 01/06/2024, 9:29 AM

## 2024-01-06 NOTE — Progress Notes (Signed)
 PROGRESS NOTE  Linda [name]  DOB: [birth year]  PCP: Candise Aleene DEL, MD FMW:[id number]  DOA: 12/25/2023  LOS: 12 days  Hospital Day: 13  Brief narrative: Linda [name] is a 78 y.o. female with PMH significant for restrictive lung disease from asthma and asbesosis with pleural plaques, chronically on prednisone  and 3 L O2, HTN, HLD, CHF, esophageal stricture s/p prior dilatation.  1/2, patient presented to the ED with complaint of shortness of breath, wheezing, productive cough. CT chest showed extensive bilateral calcified pleural plaques, also showed increasing airspace disease in the medial right lower lobe suspicious for pneumonia RVP negative. COVID-19, influenza and RSV negative. Procalcitonin negative.  Patient was admitted for acute exacerbation of asthma  Started on IV Solu-Medrol , azithromycin , bronchodilators as well as empiric antibiotics PCCM consulted 1/5, CT sinus showed findings compatible with acute sinusitis involving bilateral maxillary and sphenoid sinus fluid  Patient's hospital course was prolonged because of persistent cough despite maximal medical management.  She seems to have chronic persistent cough related to asbestosis.  Palliative care team was consulted.  Hospice was recommended.  Patient is now DNR/DNI but she has not still committed to hospice. 1/13, palliative care made some adjustments in her cough regimen.  Subjective: Patient was seen and examined this morning. Continues to have cough, almost always dry. Has been walking with mobility every day.  Assessment and plan: Acute on chronic hypoxic respiratory failure Acute sinusitis Acute exacerbation of asthma Restrictive lung disease in setting of asbestosis with pleural plaques Presented with worsening shortness of breath in the setting of pre-existing asthma, asbestosis. Admitted for asthma exacerbation.   Started on steroids. There was initial suspicion of pneumonia as well and hence  she was started on Rocephin  and azithromycin . 1/5, CT sinus showed findings compatible with acute sinusitis involving bilateral maxillary and sphenoid sinus fluid Pulmonary consultation appreciated.  Per pulmonology note, VCD possible but less likely as he has had laryngoscopy x 2.  Recent SLP evaluations with MBS did not show obvious aspiration. Patient seems to be nearing maximal treatment aimed at asthma, GERD, allergy and UACS. PCCM recommended an empiric 10-day course of Levaquin  to treat sinusitis, to complete on 1/16. Home dose of prednisone  5 mg daily to continue Continue Magic mouthwash for thrush and throat irritation. Continue gabapentin  to help with cough suppression Continue bronchodilators, Claritin , Singulair  1/9, I got in touch with ENT Dr. Soldatova as suggested by [facility] and speech therapist.  ENT recommended outpatient follow-up. Palliative care to follow-up appreciated.  Some adjustments made in her cough regimen on 1/13. Patient is aware that she has been tried on maximal medical therapy without meaningful benefit.  Hospice has been offered but patient has not fully committed to it.  Anxiety I believe there is a component of anxiety as well driving her respiratory distress.  Currently on maximum dose of Cymbalta  at 60 mg daily, Klonopin  0.5 mg twice daily, Lamictal  200 mg daily and Xanax  0.5 mg TID  CAD, HLD PTA meds- aspirin  81 mg daily,, Crestor  and Imdur   Continue Imdur  and Crestor .  Currently aspirin  is on hold.   Chronic diastolic heart failure Hypertension Euvolemic. PTA meds-Toprol  100 mg daily, amlodipine  5 mg daily, Imdur  30 mg twice daily, Lasix  20 mg daily In the last few days patient's blood pressure has been running low All 3 blood pressure medicines are on hold Continue to monitor.  GERD Continue PPI    Chronic pain syndrome Continue oxycodone   Restless legs Requip    Mobility: Encourage  ambulation  Goals of care   Code Status: Limited: Do not  attempt resuscitation (DNR) -DNR-LIMITED -Do Not Intubate/DNI      DVT prophylaxis:  enoxaparin  (LOVENOX ) injection 40 mg Start: 12/26/23 1930 SCDs Start: 12/25/23 2315   Antimicrobials: Levaquin  Fluid: None Consultants: Pulmonary Family Communication: None at bedside  Status: Inpatient Level of care:  Progressive   Patient is from: Home Needs to continue in-hospital care: Continues to complain of short of breath, continues to have cough. Anticipated d/c to: Unclear at this time.  Pending palliative care follow-up today.     Diet:  Diet Order             Diet regular Room service appropriate? Yes; Fluid consistency: Thin  Diet effective now                   Scheduled Meds:  acetaminophen   1,000 mg Oral Once   arformoterol   15 mcg Nebulization BID   benzonatate   200 mg Oral BID   budesonide  (PULMICORT ) nebulizer solution  0.25 mg Nebulization BID   clonazePAM   1 mg Oral q morning   clonazePAM   2 mg Oral QHS   cyclobenzaprine   5 mg Oral BID   DULoxetine   60 mg Oral Daily   enoxaparin  (LOVENOX ) injection  40 mg Subcutaneous Q24H   famotidine   20 mg Oral QHS   feeding supplement  1 Container Oral TID BM   fluticasone   2 spray Each Nare Daily   gabapentin   400 mg Oral TID   guaiFENesin   600 mg Oral BID   lamoTRIgine   200 mg Oral Daily   levofloxacin   500 mg Oral Daily   lidocaine   5 mL Nebulization BID   loratadine   10 mg Oral Daily   montelukast   10 mg Oral QHS   multivitamin with minerals  1 tablet Oral Daily   pantoprazole   40 mg Oral BID   predniSONE   5 mg Oral Q breakfast   ramelteon   8 mg Oral QHS   revefenacin   175 mcg Nebulization Daily   scopolamine   1 patch Transdermal Q72H   sodium chloride  flush  3 mL Intravenous Q12H   traZODone   300 mg Oral QHS    PRN meds: acetaminophen  **OR** acetaminophen , HYDROcodone  bit-homatropine, ipratropium-albuterol , menthol -cetylpyridinium, ondansetron  **OR** ondansetron  (ZOFRAN ) IV, oxyCODONE -acetaminophen   **AND** oxyCODONE , polyethylene glycol, rOPINIRole , sodium chloride  flush   Infusions:    Antimicrobials: Anti-infectives (From admission, onward)    Start     Dose/Rate Route Frequency Ordered Stop   12/29/23 1345  levofloxacin  (LEVAQUIN ) tablet 500 mg        500 mg Oral Daily 12/29/23 1322 01/08/24 1159   12/28/23 1400  cefTRIAXone  (ROCEPHIN ) 1 g in sodium chloride  0.9 % 100 mL IVPB  Status:  Discontinued        1 g 200 mL/hr over 30 Minutes Intravenous Every 24 hours 12/28/23 1300 12/29/23 1322   12/26/23 2315  azithromycin  (ZITHROMAX ) tablet 500 mg  Status:  Discontinued       Placed in Followed by Linked Group   500 mg Oral Daily 12/25/23 2315 12/25/23 2320   12/26/23 2200  azithromycin  (ZITHROMAX ) tablet 500 mg  Status:  Discontinued        500 mg Oral Daily 12/25/23 2321 12/25/23 2322   12/26/23 2200  azithromycin  (ZITHROMAX ) tablet 500 mg  Status:  Discontinued        500 mg Oral Daily 12/25/23 2322 12/28/23 1300   12/25/23 2314  azithromycin  (ZITHROMAX ) 500 mg  in sodium chloride  0.9 % 250 mL IVPB  Status:  Discontinued       Placed in Followed by Linked Group   500 mg 250 mL/hr over 60 Minutes Intravenous Every 24 hours 12/25/23 2315 12/25/23 2320   12/25/23 2030  cefTRIAXone  (ROCEPHIN ) 2 g in sodium chloride  0.9 % 100 mL IVPB  Status:  Discontinued        2 g 200 mL/hr over 30 Minutes Intravenous Every 24 hours 12/25/23 2027 12/27/23 1727   12/25/23 2030  azithromycin  (ZITHROMAX ) 500 mg in sodium chloride  0.9 % 250 mL IVPB  Status:  Discontinued        500 mg 250 mL/hr over 60 Minutes Intravenous Every 24 hours 12/25/23 2027 12/27/23 1108       Objective: Vitals:   01/06/24 0905 01/06/24 1111  BP:  93/73  Pulse:  73  Resp: 20 (!) 21  Temp:  98.3 F (36.8 C)  SpO2: 95% 95%    Intake/Output Summary (Last 24 hours) at 01/06/2024 1310 Last data filed at 01/06/2024 1013 Gross per 24 hour  Intake 1030 ml  Output 1800 ml  Net -770 ml   Filed Weights    01/04/24 0536 01/05/24 0559 01/06/24 0430  Weight: 70.2 kg 69.8 kg 68.2 kg   Weight change: -1.6 kg Body mass index is 25.81 kg/m.   Physical Exam: General exam: Pleasant, elderly Skin: No rashes, lesions or ulcers. HEENT: Atraumatic, normocephalic, no obvious bleeding Lungs: Clear to auscultation bilaterally, transmitted sounds from throat.  Coughs intermittently.  Nonproductive CVS: S1, S2, no murmur,   GI/Abd: Soft, nontender, nondistended, bowel sound present,   CNS: Alert, awake, oriented x 3 Psychiatry: Mood appropriate,  Extremities: No pedal edema, no calf tenderness, arthritic changes in hand  Data Review: I have personally reviewed the laboratory data and studies available.  F/u labs  Unresulted Labs (From admission, onward)     Start     Ordered   01/07/24 0500  Basic metabolic panel  Tomorrow morning,   R       Question:  Specimen collection method  Answer:  Lab=Lab collect   01/06/24 1310   01/07/24 0500  CBC with Differential/Platelet  Tomorrow morning,   R       Question:  Specimen collection method  Answer:  Lab=Lab collect   01/06/24 1310            Total time spent in review of labs and imaging, patient evaluation, formulation of plan, documentation and communication with family: 45 minutes  Signed, Chapman Rota, MD [facility] 01/06/2024

## 2024-01-06 NOTE — Progress Notes (Signed)
 Physical Therapy Treatment Patient Details Name: Linda [name] MRN: [id number] DOB: 11/27/1946 Today's Date: 01/06/2024   History of Present Illness Pt is a 78 y.o. female who presents 12/25/23 with asthma exacerbation. PMH - chronic bil knee pain due to osteoarthritis, chronic low back pain, anxiety/depression, osteoporosis, chf, copd, htn, fibromyalgia, PE.    PT Comments  Pt admitted with above diagnosis. Pt was able to ambulate in hallway with CGA and cues as she has decr safety awareness as she fatigues. She also needing min assist with sit to stand as her right knee was buckling upon initial standing.  Updated CM and ultimately pt would benefit from incr caregiver intiially when she goes home.  Will continue to follow acutely.  Pt currently with functional limitations due to the deficits listed below (see PT Problem List). Pt will benefit from acute skilled PT to increase their independence and safety with mobility to allow discharge.       If plan is discharge home, recommend the following: Assistance with cooking/housework;Assist for transportation;A little help with walking and/or transfers;A little help with bathing/dressing/bathroom;Help with stairs or ramp for entrance   Can travel by private vehicle        Equipment Recommendations  None recommended by PT    Recommendations for Other Services       Precautions / Restrictions Precautions Precautions: Fall Precaution Comments: 3L O2 at all times Restrictions Weight Bearing Restrictions Per Provider Order: No     Mobility  Bed Mobility Overal bed mobility: Modified Independent             General bed mobility comments: No assistance needed, HOB elevated    Transfers Overall transfer level: Needs assistance Equipment used: Rollator (4 wheels) Transfers: Sit to/from Stand, Bed to chair/wheelchair/BSC Sit to Stand: Min assist           General transfer comment: Pt had a lot of difficulty with sit to stand  today due to right knee buckling per pt.  She needed min assist initially to get her balance.    Ambulation/Gait Ambulation/Gait assistance: Contact guard assist, Supervision Gait Distance (Feet): 450 Feet Assistive device: Rollator (4 wheels) Gait Pattern/deviations: Step-through pattern, Decreased stride length, Trunk flexed Gait velocity: decr Gait velocity interpretation: 1.31 - 2.62 ft/sec, indicative of limited community ambulator   General Gait Details: kyphotic flexed posture. no overt LOB and maintained safe position to RW. CGA for safety as pt with hurried pace as she got tired and she needed cues to slow down. Pt encouraged seated rest break for energy conservation to prevent fatigue, but pt declined.   Stairs             Wheelchair Mobility     Tilt Bed    Modified Rankin (Stroke Patients Only)       Balance Overall balance assessment: Mild deficits observed, not formally tested                                          Cognition Arousal: Alert Behavior During Therapy: WFL for tasks assessed/performed Overall Cognitive Status: Within Functional Limits for tasks assessed                                          Exercises      General  Comments General comments (skin integrity, edema, etc.): Requires 3LO2 to keep sats >93%; other VSS      Pertinent Vitals/Pain Pain Assessment Pain Assessment: No/denies pain    Home Living                          Prior Function            PT Goals (current goals can now be found in the care plan section) Progress towards PT goals: Progressing toward goals    Frequency    Min 1X/week      PT Plan      Co-evaluation              AM-PAC PT 6 Clicks Mobility   Outcome Measure  Help needed turning from your back to your side while in a flat bed without using bedrails?: None Help needed moving from lying on your back to sitting on the side of a flat  bed without using bedrails?: A Little Help needed moving to and from a bed to a chair (including a wheelchair)?: A Little Help needed standing up from a chair using your arms (e.g., wheelchair or bedside chair)?: A Little Help needed to walk in hospital room?: A Little Help needed climbing 3-5 steps with a railing? : A Little 6 Click Score: 19    End of Session Equipment Utilized During Treatment: Oxygen ;Gait belt Activity Tolerance: Patient limited by fatigue Patient left: with call bell/phone within reach;in bed Nurse Communication: Mobility status PT Visit Diagnosis: Unsteadiness on feet (R26.81);Other abnormalities of gait and mobility (R26.89);Muscle weakness (generalized) (M62.81)     Time: 9048-8977 PT Time Calculation (min) (ACUTE ONLY): 31 min  Charges:    $Gait Training: 23-37 mins PT General Charges $$ ACUTE PT VISIT: 1 Visit                     Mecca Guitron M,PT Acute Rehab Services [phone]    Stephane JULIANNA Bevel 01/06/2024, 12:24 PM

## 2024-01-06 NOTE — Plan of Care (Signed)

## 2024-01-06 NOTE — Plan of Care (Signed)
  Problem: Clinical Measurements: Goal: Will remain free from infection Outcome: Progressing Goal: Cardiovascular complication will be avoided Outcome: Progressing   Problem: Activity: Goal: Risk for activity intolerance will decrease Outcome: Progressing   Problem: Nutrition: Goal: Adequate nutrition will be maintained Outcome: Progressing   Problem: Elimination: Goal: Will not experience complications related to urinary retention Outcome: Progressing   Problem: Safety: Goal: Ability to remain free from injury will improve Outcome: Progressing   Problem: Skin Integrity: Goal: Risk for impaired skin integrity will decrease Outcome: Progressing

## 2024-01-07 ENCOUNTER — Ambulatory Visit: Payer: Medicare Other | Admitting: Family Medicine

## 2024-01-07 ENCOUNTER — Inpatient Hospital Stay (HOSPITAL_COMMUNITY): Payer: Medicare Other

## 2024-01-07 DIAGNOSIS — Z7189 Other specified counseling: Secondary | ICD-10-CM | POA: Diagnosis not present

## 2024-01-07 DIAGNOSIS — J4551 Severe persistent asthma with (acute) exacerbation: Secondary | ICD-10-CM | POA: Diagnosis not present

## 2024-01-07 DIAGNOSIS — I10 Essential (primary) hypertension: Secondary | ICD-10-CM | POA: Diagnosis not present

## 2024-01-07 DIAGNOSIS — J9621 Acute and chronic respiratory failure with hypoxia: Secondary | ICD-10-CM | POA: Diagnosis not present

## 2024-01-07 DIAGNOSIS — Z515 Encounter for palliative care: Secondary | ICD-10-CM | POA: Diagnosis not present

## 2024-01-07 DIAGNOSIS — F419 Anxiety disorder, unspecified: Secondary | ICD-10-CM | POA: Diagnosis not present

## 2024-01-07 LAB — CBC WITH DIFFERENTIAL/PLATELET
Abs Immature Granulocytes: 0.05 10*3/uL (ref 0.00–0.07)
Basophils Absolute: 0 10*3/uL (ref 0.0–0.1)
Basophils Relative: 0 %
Eosinophils Absolute: 0.1 10*3/uL (ref 0.0–0.5)
Eosinophils Relative: 1 %
HCT: 38.1 % (ref 36.0–46.0)
Hemoglobin: 11.6 g/dL — ABNORMAL LOW (ref 12.0–15.0)
Immature Granulocytes: 1 %
Lymphocytes Relative: 21 %
Lymphs Abs: 2.2 10*3/uL (ref 0.7–4.0)
MCH: 27.6 pg (ref 26.0–34.0)
MCHC: 30.4 g/dL (ref 30.0–36.0)
MCV: 90.7 fL (ref 80.0–100.0)
Monocytes Absolute: 0.8 10*3/uL (ref 0.1–1.0)
Monocytes Relative: 8 %
Neutro Abs: 7.4 10*3/uL (ref 1.7–7.7)
Neutrophils Relative %: 69 %
Platelets: 196 10*3/uL (ref 150–400)
RBC: 4.2 MIL/uL (ref 3.87–5.11)
RDW: 14 % (ref 11.5–15.5)
WBC: 10.6 10*3/uL — ABNORMAL HIGH (ref 4.0–10.5)
nRBC: 0 % (ref 0.0–0.2)

## 2024-01-07 LAB — BASIC METABOLIC PANEL
Anion gap: 5 (ref 5–15)
BUN: 14 mg/dL (ref 8–23)
CO2: 31 mmol/L (ref 22–32)
Calcium: 8.6 mg/dL — ABNORMAL LOW (ref 8.9–10.3)
Chloride: 100 mmol/L (ref 98–111)
Creatinine, Ser: 0.84 mg/dL (ref 0.44–1.00)
GFR, Estimated: >60 mL/min (ref >60)
Glucose, Bld: 110 mg/dL — ABNORMAL HIGH (ref 70–99)
Potassium: 3.8 mmol/L (ref 3.5–5.1)
Sodium: 136 mmol/L (ref 135–145)

## 2024-01-07 NOTE — Progress Notes (Signed)
 Mobility Specialist Progress Note:    01/07/24 1450  Mobility  Activity Ambulated with assistance in hallway  Level of Assistance Contact guard assist, steadying assist  Assistive Device Four wheel walker  Distance Ambulated (ft) 400 ft  Activity Response Tolerated well  Mobility Referral Yes  Mobility visit 1 Mobility  Mobility Specialist Start Time (ACUTE ONLY) 1200  Mobility Specialist Stop Time (ACUTE ONLY) 1214  Mobility Specialist Time Calculation (min) (ACUTE ONLY) 14 min   Pt received in bed agreeable to mobility. Pt needed assistance changing linen d/t it being wet. Pt needed contact guard during session d/t slight R knee buckling. Attempted to get a SPO2 reading during ambulation, unable to d/t poor pleth. Returned to room w/o fault. Left in chair w/ call bell and personal belongings in reach. All needs met.   Inetta Manes Mobility Specialist  Please contact vis Secure Chat or  Rehab Office [phone]

## 2024-01-07 NOTE — Progress Notes (Signed)
 Occupational Therapy Treatment Patient Details Name: Linda [name] MRN: [id number] DOB: [birth year] Today's Date: 01/07/2024   History of present illness Pt is a 78 y.o. female who presents 12/25/23 with asthma exacerbation. PMH - chronic bil knee pain due to osteoarthritis, chronic low back pain, anxiety/depression, osteoporosis, chf, copd, htn, fibromyalgia, PE.   OT comments  OT session focused on training in techniques for increased safety and independence with ADLs and functional transfers/mobility and improve activity tolerance. Pt currently demonstrates ability to complete UB ADLs with Set up to Contact guard assist, LB ADLs with Set up to Contact guard assist, and functional transfers/mobility with a RW with Supervision to Contact guard assist for safety due to pain and fatigue. Pt demonstrates ability to tolerate 8 minutes of grooming activities in standing at sink with UE support prior to requiring seated rest break. Pt with O2 sats largely 90% to 93% on 2L conitnuous O2 through nasal cannula throughout session with brief drop to 88% on 2L during activities in standing. Pt participated well in session and has met or is making progress toward OT all goals. OT goals updated and/or upgraded this day with new goal added to address energy conservation. OT also answered pt's general questions about what to expect during [facility] rehab services. Pt will benefit from continued acute skilled OT services to address deficits outlined below and increase safety and independence with functional tasks. Post acute discharge, pt will benefit from continued skilled OT services in the home to maximize rehab potential.       If plan is discharge home, recommend the following:  A little help with walking and/or transfers;A little help with bathing/dressing/bathroom;Assistance with cooking/housework;Assist for transportation   Equipment Recommendations  BSC/3in1    Recommendations for Other Services       Precautions / Restrictions Precautions Precautions: Fall;Other (comment) Precaution Comments: watch O2 Restrictions Weight Bearing Restrictions Per Provider Order: No       Mobility Bed Mobility Overal bed mobility: Modified Independent             General bed mobility comments: No assistance needed, HOB elevated, mildly increased time    Transfers Overall transfer level: Needs assistance Equipment used: Rolling walker (2 wheels) Transfers: Sit to/from Stand, Bed to chair/wheelchair/BSC Sit to Stand: Supervision, Contact guard assist     Step pivot transfers: Contact guard assist     General transfer comment: Pt demonstrating ability to complete STS transfer with Supervision at beginning of session but requiring CGA for STS and step-pivot transfers for safety as session progressed due to fatigue and pt reporting feeling her "knees might buckle" with activities in standing.     Balance Overall balance assessment: Mild deficits observed, not formally tested                                         ADL either performed or assessed with clinical judgement   ADL Overall ADL's : Needs assistance/impaired Eating/Feeding: Set up;Sitting   Grooming: Wash/dry hands;Oral care;Supervision/safety;Contact guard assist;Standing   Upper Body Bathing: Set up;Sitting Upper Body Bathing Details (indicate cue type and reason): Largely Set up with occasional assist to manage lines Lower Body Bathing: Set up;Sitting/lateral leans;Contact guard assist;Sit to/from stand   Upper Body Dressing : Set up;Sitting Upper Body Dressing Details (indicate cue type and reason): Largely Set up with occasional assist to manage lines Lower Body Dressing: Set up;Sitting/lateral leans;Contact  guard assist;Sit to/from stand   Toilet Transfer: Contact guard assist;Ambulation;Rolling walker (2 wheels);BSC/3in1 (with mildly increased time) Toilet Transfer Details (indicate cue type and  reason): simulated to chair; assist for management of lines Toileting- Clothing Manipulation and Hygiene: Supervision/safety;Contact guard assist;Sit to/from stand       Functional mobility during ADLs: Contact guard assist;Rolling walker (2 wheels) General ADL Comments: Pt tolerating 8 minutes in standing during grooming tasks at sink prior to requiring a seated rest break with O2 sats largely 90% to 93% on 2L conitnuous O2 through nasal cannula throughout session with brief drop to 88% on 2L during activities in standing.    Extremity/Trunk Assessment Upper Extremity Assessment Upper Extremity Assessment: Right hand dominant;Generalized weakness   Lower Extremity Assessment Lower Extremity Assessment: Defer to PT evaluation        Vision       Perception     Praxis      Cognition Arousal: Alert Behavior During Therapy: [facility] for tasks assessed/performed Overall Cognitive Status: Within Functional Limits for tasks assessed                                 General Comments: AAOx4 and pleasant throughout session with cognition largely [facility]. Pt requires mildly increased time for processing and motor planning. Mild short-term memory deficits noted during session.        Exercises      Shoulder Instructions       General Comments OT answered pt's general questions about what to expect from [facility] rehab services following post acute discharge.    Pertinent Vitals/ Pain       Pain Assessment Pain Assessment: Faces Faces Pain Scale: Hurts even more Pain Location: discomfort during coughing; back; B knees in standing Pain Descriptors / Indicators: Discomfort, Grimacing, Aching, Sore Pain Intervention(s): Limited activity within patient's tolerance, Monitored during session, Repositioned, RN gave pain meds during session  Home Living                                          Prior Functioning/Environment              Frequency  Min 1X/week         Progress Toward Goals  OT Goals(current goals can now be found in the care plan section)  Progress towards OT goals: Progressing toward goals;Goals updated;Goals met and updated - see care plan (Pt has met OOB activitiy goal with goal upgraded this day. Pt is making progress toward all other goals with goals updated this day. Additional goal added to address energy conservation.)  Acute Rehab OT Goals Patient Stated Goal: to feel less tired, be able to breath easier, and to return home OT Goal Formulation: With patient Time For Goal Achievement: 01/21/24 Potential to Achieve Goals: Good ADL Goals Pt Will Transfer to Toilet: with modified independence Pt Will Perform Toileting - Clothing Manipulation and hygiene: with modified independence Additional ADL Goal #1: Patient will tolerate 10 or more minutes of continuous OOB activities to maximize activity tolerance using least restrictive AD as needed. Additional ADL Goal #2: Patient will demonstrate ability to Independently state 3 energy conservation strategies to increase safety and independence during functional tasks with handout provided.  Plan      Co-evaluation  AM-PAC OT "6 Clicks" Daily Activity     Outcome Measure   Help from another person eating meals?: None Help from another person taking care of personal grooming?: A Little Help from another person toileting, which includes using toliet, bedpan, or urinal?: A Little Help from another person bathing (including washing, rinsing, drying)?: A Little Help from another person to put on and taking off regular upper body clothing?: A Little Help from another person to put on and taking off regular lower body clothing?: A Little 6 Click Score: 19    End of Session Equipment Utilized During Treatment: Gait belt;Rolling walker (2 wheels);Oxygen   OT Visit Diagnosis: Muscle weakness (generalized) (M62.81);Other (comment) (decreased activity  tolerance)   Activity Tolerance Patient tolerated treatment well;Patient limited by fatigue   Patient Left in bed;with call bell/phone within reach;with bed alarm set   Nurse Communication Mobility status;Other (comment) (Pain level, vital signs)        Time: 4098-1191 OT Time Calculation (min): 48 min  Charges: OT General Charges $OT Visit: 1 Visit OT Treatments $Self Care/Home Management : 23-37 mins $Therapeutic Activity: 8-22 mins  Lilyan Prete "Darral Ellis., OTR/L, MA Acute Rehab [phone]   Walt Gunner 01/07/2024, 5:51 PM

## 2024-01-07 NOTE — Progress Notes (Signed)
Palliative:  HPI:  78 y.o. female  with past medical history of asthma, asbestosis, diastolic CHF, chronic back pain, CAD, and chronic respiratory failure on 3L [place] admitted on 12/25/2023 from independent living with wheezing, SOB, and cough. Being treated for acute on chronic hypoxemic respiratory failure in setting of asbesosis with pleural plaques along with asthma.  I met again today with Linda [name]. Linda [name] is sitting in recliner. She reports that her cough and pain are improved with medication changes. However, she also seems more fatigued and tired than previously. I worry about her ability to manage at home. Linda [name] has poor insight into her limitations. She is able to walk in hall with PT but I am not sure how she will manage at home alone with meals, medications, and basic needs with poor reserve and fatigue. We did discuss potential for rehab stay as well as hospice support at home. Not sure that she is a candidate for rehab. She agrees with both avenues of whatever is indicated and an option for her. She understands that her condition and quality of life is worsening and is open to hospice and more comfort focused care with further improvement. I worry that she would not have enough help even with hospice at home. She gives me permission to call and discuss with her son.   I call and speak with son, Elijah Birk. Tom and I review his mother's condition and lack of improvement in symptom burden. Discussed the impact of increased symptoms and overall worsening condition. Elijah Birk expresses frustration that she had a similar admission in July with acute sinus infection that he feels was missed for over a week and she never recovered. He expresses concern that she never improved from her sinus infection. I discussed further with Elijah Birk as I reviewed records from admission in July as well as now. We discussed that she would not have had an acute infection ongoing since July or she would have had more severe worsening  status and symptoms. We discussed how an acute infection can act as a catalyst in the setting of chronic lung disease to lead to overall worsening respiratory status and symptoms and declined baseline - this is a very common occurrence. Elijah Birk shares that this is the first time that he has heard this in a way that makes sense to him. He would like confirmation that her sinus infection if treated and not leading to her symptoms. I explained that I will discuss with attending physician about follow up CT to reassess status of infection. I reassured him that she has been on appropriate antibiotic therapy that should resolve acute sinus infection. I also encouraged Elijah Birk that I worry that even with treated infection I believe this admission will be similar to the last admission and another catalyst for worsening status and I worry about her new baseline. He agrees to continue conversation tomorrow after evaluation of infection status.   All questions/concerns addressed. Emotional support provided. Will follow up again tomorrow.   Exam: Alert but easily drifting off during conversation. Oriented x 3. No distress. Poor reserve. Weakness and fatigue. Breathing regular, unlabored at rest. Ongoing cough but improved.   Plan: - Follow up CT for sinus infection - DNR/DNI - Home with hospice? She agrees - ongoing discussion with family  37 min  Yong Channel, NP Palliative Medicine Team Pager [phone] (Please see [email or web address] for schedule) Team Phone [phone]

## 2024-01-07 NOTE — Progress Notes (Signed)
 [facility]                                                                              Linda [name], is a 78 y.o. female, DOB - 06-23-1946, MWU:[id number] Admit date - 12/25/2023    Outpatient Primary MD for the patient is McGowen, Minetta Aly, MD  LOS - 13  days  Chief Complaint  Patient presents with   Shortness of Breath       Brief summary   Patient is a 78 year old female with restrictive lung disease from asthma, asbestosis, pleural plaques, chronically on prednisone  and 3-4 L O2 via [place], HTN, HLD, CHF, esophageal stricture status post prior dilation.  Patient had presented to ED on 1/2 with complaints of shortness of breath, wheezing, productive cough.  CT chest showed extensive bilateral calcified pleural plaques, increasing airspace disease in the medial right lower lobe suspicious for pneumonia. COVID, RSV, flu, RVP negative Patient had CT evidence showed acute sinusitis including bilateral maxillary and sphenoid sinus fluid. PCCM was consulted.  Hospital course prolonged due to persistent cough, shortness of breath despite maximal medical management.  Palliative medicine consulted, recommended hospice.  Assessment & Plan    Principal Problem:  Acute on chronic hypoxic respiratory failure (HCC) Acute sinusitis Acute exacerbation of asthma Restrictive lung disease in setting of asbestosis with pleural plaques -Presented with worsening shortness of breath in the setting of pre-existing asthma, asbestosis.  Outpatient on prednisone  and O2 3 to 4 L via [place] -Initial suspicion for PNA and was started on IV Rocephin  and Zithromax . -CT sinus 1/5 showed acute sinusitis including bilateral maxillary and sphenoid sinus fluid -Pulmonology was consulted.  Per pulmonology, VCD possible, less likely as she had laryngoscopy done to.  Recent SLP evaluation with MBS did not show obvious aspiration. - Patient seems to be nearing maximal treatment aimed at asthma, GERD,  allergy and UACS. -PCCM recommended an empiric 10-day course of Levaquin  to treat sinusitis, to complete on 1/16. -Continue outpatient dose of prednisone  5 mg daily - -Continue Magic mouthwash for thrush and throat irritation.  -Continue Hycodan, gabapentin  400 mg 3 times daily -Continue Pulmicort , Brovana , Yupelri , Claritin , Singulair   -Previous attending physician, Dr. Gwynneth Lessen discussed with ENT Dr. Soldatova as suggested by [facility] and speech therapist.  ENT recommended outpatient follow-up. -Palliative medicine following, patient is aware that she has been tried on maximal medical therapy without meaningful benefit.  Hospice has been offered but patient has not fully committed to it.   Anxiety -Currently on maximum dose of Cymbalta  at 60 mg daily, Klonopin  0.5 mg twice daily, Lamictal  200 mg daily and Xanax  0.5 mg TID   CAD, HLD -On Crestor , aspirin  prior to admission   Chronic diastolic heart failure Hypertension -Currently stable, euvolemic - PTA meds-Toprol  100 mg daily, amlodipine  5 mg daily, Imdur  30 mg twice daily, Lasix  20 mg daily -Meds were placed on hold due to hypotension   GERD Continue PPI    Chronic pain syndrome Continue oxycodone    Restless legs Continue Requip     Moderate protein calorie malnutrition Nutrition Problem: Moderate Malnutrition Etiology: acute illness Signs/Symptoms: mild fat depletion, moderate muscle depletion, energy  intake < 75% for > 7 days Interventions: Ensure Enlive (each supplement provides 350kcal and 20 grams of protein), Liberalize Diet, MVI  Estimated body mass index is 25.81 kg/m as calculated from the following:   Height as of this encounter: 5\' 4"  (1.626 m).   Weight as of this encounter: 68.2 kg.  Code Status: DNR DVT Prophylaxis:  enoxaparin  (LOVENOX ) injection 40 mg Start: 12/26/23 1930 SCDs Start: 12/25/23 2315   Level of Care: Level of care: Progressive Family Communication: Updated patient Disposition Plan:       Remains inpatient appropriate: Still tenderness respiratory status, remains short of breath, coughing  Procedures:    Consultants:   Pulmonology, palliative medicine  Antimicrobials:   Anti-infectives (From admission, onward)    Start     Dose/Rate Route Frequency Ordered Stop   12/29/23 1345  levofloxacin  (LEVAQUIN ) tablet 500 mg        500 mg Oral Daily 12/29/23 1322 01/08/24 1159   12/28/23 1400  cefTRIAXone  (ROCEPHIN ) 1 g in sodium chloride  0.9 % 100 mL IVPB  Status:  Discontinued        1 g 200 mL/hr over 30 Minutes Intravenous Every 24 hours 12/28/23 1300 12/29/23 1322   12/26/23 2315  azithromycin  (ZITHROMAX ) tablet 500 mg  Status:  Discontinued       Placed in "Followed by" Linked Group   500 mg Oral Daily 12/25/23 2315 12/25/23 2320   12/26/23 2200  azithromycin  (ZITHROMAX ) tablet 500 mg  Status:  Discontinued        500 mg Oral Daily 12/25/23 2321 12/25/23 2322   12/26/23 2200  azithromycin  (ZITHROMAX ) tablet 500 mg  Status:  Discontinued        500 mg Oral Daily 12/25/23 2322 12/28/23 1300   12/25/23 2314  azithromycin  (ZITHROMAX ) 500 mg in sodium chloride  0.9 % 250 mL IVPB  Status:  Discontinued       Placed in "Followed by" Linked Group   500 mg 250 mL/hr over 60 Minutes Intravenous Every 24 hours 12/25/23 2315 12/25/23 2320   12/25/23 2030  cefTRIAXone  (ROCEPHIN ) 2 g in sodium chloride  0.9 % 100 mL IVPB  Status:  Discontinued        2 g 200 mL/hr over 30 Minutes Intravenous Every 24 hours 12/25/23 2027 12/27/23 1727   12/25/23 2030  azithromycin  (ZITHROMAX ) 500 mg in sodium chloride  0.9 % 250 mL IVPB  Status:  Discontinued        500 mg 250 mL/hr over 60 Minutes Intravenous Every 24 hours 12/25/23 2027 12/27/23 1108          Medications  acetaminophen   1,000 mg Oral Once   arformoterol   15 mcg Nebulization BID   benzonatate   200 mg Oral BID   budesonide  (PULMICORT ) nebulizer solution  0.25 mg Nebulization BID   clonazePAM   1 mg Oral q morning   clonazePAM    2 mg Oral QHS   cyclobenzaprine   5 mg Oral BID   DULoxetine   60 mg Oral Daily   enoxaparin  (LOVENOX ) injection  40 mg Subcutaneous Q24H   famotidine   20 mg Oral QHS   feeding supplement  1 Container Oral TID BM   fluticasone   2 spray Each Nare Daily   gabapentin   400 mg Oral TID   guaiFENesin   600 mg Oral BID   lamoTRIgine   200 mg Oral Daily   levofloxacin   500 mg Oral Daily   lidocaine   5 mL Nebulization BID   loratadine   10 mg Oral Daily  montelukast   10 mg Oral QHS   multivitamin with minerals  1 tablet Oral Daily   pantoprazole   40 mg Oral BID   polyethylene glycol  17 g Oral Daily   predniSONE   5 mg Oral Q breakfast   ramelteon   8 mg Oral QHS   revefenacin   175 mcg Nebulization Daily   scopolamine   1 patch Transdermal Q72H   sodium chloride  flush  3 mL Intravenous Q12H   traZODone   300 mg Oral QHS      Subjective:   Tollie Newnham was seen and examined today.  Frail, ill-appearing, coughing and wheezing.  Visibly short of breath.  No acute events overnight.  No fevers.     Objective:   Vitals:   01/07/24 0055 01/07/24 0600 01/07/24 0735 01/07/24 0838  BP: 107/84  112/61   Pulse: 76  81 76  Resp: 16 18 19 18   Temp: 98 F (36.7 C)  97.6 F (36.4 C)   TempSrc: Oral  Oral   SpO2: 93% 94% 94% 96%  Weight:      Height:        Intake/Output Summary (Last 24 hours) at 01/07/2024 1039 Last data filed at 01/07/2024 0900 Gross per 24 hour  Intake 297 ml  Output --  Net 297 ml     Wt Readings from Last 3 Encounters:  01/06/24 68.2 kg  12/23/23 70.9 kg  12/11/23 70.1 kg     Exam General: Alert and oriented x 3, coughing intermittently.  Frail and ill-appearing Cardiovascular: S1 S2 auscultated,  RRR Respiratory: Diffuse wheezing bilaterally Gastrointestinal: Soft, nontender, nondistended, + bowel sounds Ext: no pedal edema bilaterally Neuro: no new FND's Skin: No rashes Psych: Normal affect     Data Reviewed:  I have personally reviewed following  labs    CBC Lab Results  Component Value Date   WBC 10.6 (H) 01/07/2024   RBC 4.20 01/07/2024   HGB 11.6 (L) 01/07/2024   HCT 38.1 01/07/2024   MCV 90.7 01/07/2024   MCH 27.6 01/07/2024   PLT 196 01/07/2024   MCHC 30.4 01/07/2024   RDW 14.0 01/07/2024   LYMPHSABS 2.2 01/07/2024   MONOABS 0.8 01/07/2024   EOSABS 0.1 01/07/2024   BASOSABS 0.0 01/07/2024     Last metabolic panel Lab Results  Component Value Date   NA 136 01/07/2024   K 3.8 01/07/2024   CL 100 01/07/2024   CO2 31 01/07/2024   BUN 14 01/07/2024   CREATININE 0.84 01/07/2024   GLUCOSE 110 (H) 01/07/2024   GFRNONAA >60 01/07/2024   GFRAA >60 06/18/2020   CALCIUM  8.6 (L) 01/07/2024   PHOS 3.5 12/29/2023   PROT 5.7 (L) 01/02/2024   ALBUMIN 2.7 (L) 01/02/2024   BILITOT 0.7 01/02/2024   ALKPHOS 50 01/02/2024   AST 21 01/02/2024   ALT 21 01/02/2024   ANIONGAP 5 01/07/2024    CBG (last 3)  Recent Labs    01/05/24 1540 01/05/24 2113 01/06/24 0550  GLUCAP 182* 162* 102*      Coagulation Profile: No results for input(s): "INR", "PROTIME" in the last 168 hours.   Radiology Studies: I have personally reviewed the imaging studies  No results found.     Bertram Brocks M.D. [facility] 01/07/2024, 10:39 AM  Available via Epic secure chat 7am-7pm After 7 pm, please refer to night coverage provider listed on amion.

## 2024-01-07 NOTE — Progress Notes (Signed)
 Speech Language Pathology Treatment: Dysphagia  Patient Details Name: Linda [name] MRN: [id number] DOB: 1946-09-19 Today's Date: 01/07/2024 Time: 4098-1191 SLP Time Calculation (min) (ACUTE ONLY): 10 min  Assessment / Plan / Recommendation Clinical Impression  Linda [name] was seen with breakfast tray, reporting effort with PO intake particularly with solid foods, but having eaten a lot of her tray already. She also describes a globus sensation, "like there is a ping pong ball in my throat." She took limited POs for SLP that included a bite of banana and a few sips of thin liquids, with facial grimacing noted during bite of solids. We did discuss options for softer diets to try to promote comfort during intake, but she declined. Education was provided about softer options on menu for her to consider. Baseline, persistent coughing makes it difficult to discern what may or may not be associated with PO intake, but coughing does not immediately follow swallowing and she remains afebrile. SLP did offer Min cues for Linda [name] to use cough suppression strategies during session. Will continue to follow.    HPI HPI: Linda [name] is a 78 y.o. female who presents 12/25/23 with asthma exacerbation. Linda [name] has had multiple MBS (most recently in July 2024) that have all described a prominent CP. Pharyngeal swallow is generally functional but tight UES did contribute to frank penetration x1 in June 2021. EGD in June 2021 without stricture. ENT saw during admission in July 2024 with laryngoscopy WNL. Esophagram was attempted in July 2024 as well but was severely limited and although no gross stricture was identified, evaluation of esophageal motility was not possible. PMH - restrictive lung disease and chronic hypoxemic respiratory failure in setting of asbesosis with pleural plaques along with asthma, GERD, HH, PNA, chronic bil knee pain due to osteoarthritis, chronic low back pain, anxiety/depression, osteoporosis, chf, copd, htn, fibromyalgia,  PE.      SLP Plan  Continue with current plan of care      Recommendations for follow up therapy are one component of a multi-disciplinary discharge planning process, led by the attending physician.  Recommendations may be updated based on patient status, additional functional criteria and insurance authorization.    Recommendations  Diet recommendations: Regular;Thin liquid Liquids provided via: Cup;Straw Medication Administration: Other (Comment) (as able) Supervision: Patient able to self feed;Intermittent supervision to cue for compensatory strategies Compensations: Slow rate;Small sips/bites Postural Changes and/or Swallow Maneuvers: Seated upright 90 degrees;Upright 30-60 min after meal                  Oral care BID   Intermittent Supervision/Assistance Dysphagia, unspecified (R13.10)     Continue with current plan of care     Beth Brooke., M.A. CCC-SLP Acute Rehabilitation Services Office [phone]  Secure chat preferred   01/07/2024, 10:46 AM

## 2024-01-08 DIAGNOSIS — Z7189 Other specified counseling: Secondary | ICD-10-CM | POA: Diagnosis not present

## 2024-01-08 DIAGNOSIS — J9621 Acute and chronic respiratory failure with hypoxia: Secondary | ICD-10-CM | POA: Diagnosis not present

## 2024-01-08 DIAGNOSIS — Z515 Encounter for palliative care: Secondary | ICD-10-CM | POA: Diagnosis not present

## 2024-01-08 DIAGNOSIS — I10 Essential (primary) hypertension: Secondary | ICD-10-CM | POA: Diagnosis not present

## 2024-01-08 DIAGNOSIS — I5032 Chronic diastolic (congestive) heart failure: Secondary | ICD-10-CM | POA: Diagnosis not present

## 2024-01-08 DIAGNOSIS — J4551 Severe persistent asthma with (acute) exacerbation: Secondary | ICD-10-CM | POA: Diagnosis not present

## 2024-01-08 MED ORDER — SCOPOLAMINE 1 MG/3DAYS TD PT72
1.0000 | MEDICATED_PATCH | TRANSDERMAL | Status: DC
  Administered 2024-01-08: 1.5 mg via TRANSDERMAL
  Filled 2024-01-08: qty 1

## 2024-01-08 MED ORDER — OXYCODONE HCL 5 MG PO TABS
7.5000 mg | ORAL_TABLET | ORAL | Status: DC | PRN
  Administered 2024-01-08 – 2024-01-09 (×5): 7.5 mg via ORAL
  Filled 2024-01-08 (×5): qty 2

## 2024-01-08 MED ORDER — OXYCODONE-ACETAMINOPHEN 5-325 MG PO TABS
1.0000 | ORAL_TABLET | ORAL | Status: DC | PRN
  Administered 2024-01-08: 1 via ORAL
  Filled 2024-01-08: qty 1

## 2024-01-08 MED ORDER — OXYCODONE HCL 5 MG PO TABS
5.0000 mg | ORAL_TABLET | ORAL | Status: DC | PRN
  Administered 2024-01-08: 5 mg via ORAL
  Filled 2024-01-08: qty 1

## 2024-01-08 MED ORDER — OXYCODONE-ACETAMINOPHEN 5-325 MG PO TABS
1.0000 | ORAL_TABLET | ORAL | Status: DC | PRN
  Administered 2024-01-08 – 2024-01-09 (×5): 1 via ORAL
  Filled 2024-01-08 (×5): qty 1

## 2024-01-08 MED ORDER — LEVOFLOXACIN 500 MG PO TABS
500.0000 mg | ORAL_TABLET | Freq: Every day | ORAL | Status: DC
  Administered 2024-01-08: 500 mg via ORAL
  Filled 2024-01-08 (×2): qty 1

## 2024-01-08 MED ORDER — HYDROCODONE BIT-HOMATROP MBR 5-1.5 MG/5ML PO SOLN
10.0000 mL | Freq: Four times a day (QID) | ORAL | Status: DC | PRN
  Administered 2024-01-08: 15 mL via ORAL
  Administered 2024-01-09: 10 mL via ORAL
  Filled 2024-01-08: qty 15
  Filled 2024-01-08: qty 10
  Filled 2024-01-08: qty 15

## 2024-01-08 NOTE — Progress Notes (Signed)
[facility]                                                                              Linda [name], is a 78 y.o. female, DOB - 10-11-1946, VHQ:[id number] Admit date - 12/25/2023    Outpatient Primary MD for the patient is McGowen, Maryjean Morn, MD  LOS - 14  days  Chief Complaint  Patient presents with   Shortness of Breath       Brief summary   Patient is a 78 year old female with restrictive lung disease from asthma, asbestosis, pleural plaques, chronically on prednisone and 3-4 L O2 via [place], HTN, HLD, CHF, esophageal stricture status post prior dilation.  Patient had presented to ED on 1/2 with complaints of shortness of breath, wheezing, productive cough.  CT chest showed extensive bilateral calcified pleural plaques, increasing airspace disease in the medial right lower lobe suspicious for pneumonia. COVID, RSV, flu, RVP negative Patient had CT evidence showed acute sinusitis including bilateral maxillary and sphenoid sinus fluid. PCCM was consulted.  Hospital course prolonged due to persistent cough, shortness of breath despite maximal medical management.  Palliative medicine consulted, recommended hospice.  Assessment & Plan    Principal Problem:  Acute on chronic hypoxic respiratory failure (HCC) Acute sinusitis Acute exacerbation of asthma Restrictive lung disease in setting of asbestosis with pleural plaques -Presented with worsening shortness of breath in the setting of pre-existing asthma, asbestosis.  Outpatient on prednisone and O2 3 to 4 L via [place] -Initial suspicion for PNA and was started on IV Rocephin and Zithromax. -CT sinus 1/5 showed acute sinusitis including bilateral maxillary and sphenoid sinus fluid -Pulmonology was consulted.  Per pulmonology, VCD possible, less likely as she had laryngoscopy done to.  Recent SLP evaluation with MBS did not show obvious aspiration. - Patient seems to be nearing maximal treatment aimed at asthma, GERD,  allergy and UACS. -PCCM recommended an empiric Levaquin to treat sinusitis -Continue outpatient dose of prednisone 5 mg daily -Continue Magic mouthwash for thrush and throat irritation.  -Continue Hycodan, gabapentin 400 mg 3 times daily -Continue Pulmicort, Rosalyn Gess, Yupelri, Claritin, Singulair  -Previous attending physician, Dr. Pola Corn discussed with ENT Dr. Irene Pap as suggested by [facility] and speech therapist.  ENT recommended outpatient follow-up. -Palliative medicine following, patient is aware that she has been tried on maximal medical therapy without meaningful benefit.  Hospice has been offered but patient has not fully committed to it. -CT sinus overnight showed improved variation of the maxillary sinuses and sphenoid sinuses, will continue levofloxacin for 4 more days to complete 14-day course.   Anxiety -Currently on maximum dose of Cymbalta at 60 mg daily, Klonopin 0.5 mg twice daily, Lamictal 200 mg daily and Xanax 0.5 mg TID   CAD, HLD -On Crestor, aspirin prior to admission   Chronic diastolic heart failure Hypertension -Currently stable, euvolemic - PTA meds-Toprol 100 mg daily, amlodipine 5 mg daily, Imdur 30 mg twice daily, Lasix 20 mg daily -Meds were placed on hold due to hypotension   GERD Continue PPI    Chronic pain syndrome Continue oxycodone   Restless legs Continue Requip    Moderate protein calorie  malnutrition Nutrition Problem: Moderate Malnutrition Etiology: acute illness Signs/Symptoms: mild fat depletion, moderate muscle depletion, energy intake < 75% for > 7 days Interventions: Ensure Enlive (each supplement provides 350kcal and 20 grams of protein), Liberalize Diet, MVI  Estimated body mass index is 25.81 kg/m as calculated from the following:   Height as of this encounter: 5\' 4"  (1.626 m).   Weight as of this encounter: 68.2 kg.  Code Status: DNR DVT Prophylaxis:  enoxaparin (LOVENOX) injection 40 mg Start: 12/26/23 1930 SCDs Start:  12/25/23 2315   Level of Care: Level of care: Progressive Family Communication: Updated patient Disposition Plan:      Remains inpatient appropriate: Remains short of breath, coughing, wheezing.  With her current tenuous respiratory status, not safe for discharge to independent living facility, needs a higher level of care/supervision.  Ongoing discussions with palliative medicine regarding hospice.  Procedures:    Consultants:   Pulmonology, palliative medicine  Antimicrobials:   Anti-infectives (From admission, onward)    Start     Dose/Rate Route Frequency Ordered Stop   01/08/24 1215  levofloxacin (LEVAQUIN) tablet 500 mg        500 mg Oral Daily 01/08/24 1128 01/12/24 1159   12/29/23 1345  levofloxacin (LEVAQUIN) tablet 500 mg        500 mg Oral Daily 12/29/23 1322 01/07/24 1157   12/28/23 1400  cefTRIAXone (ROCEPHIN) 1 g in sodium chloride 0.9 % 100 mL IVPB  Status:  Discontinued        1 g 200 mL/hr over 30 Minutes Intravenous Every 24 hours 12/28/23 1300 12/29/23 1322   12/26/23 2315  azithromycin (ZITHROMAX) tablet 500 mg  Status:  Discontinued       Placed in "Followed by" Linked Group   500 mg Oral Daily 12/25/23 2315 12/25/23 2320   12/26/23 2200  azithromycin (ZITHROMAX) tablet 500 mg  Status:  Discontinued        500 mg Oral Daily 12/25/23 2321 12/25/23 2322   12/26/23 2200  azithromycin (ZITHROMAX) tablet 500 mg  Status:  Discontinued        500 mg Oral Daily 12/25/23 2322 12/28/23 1300   12/25/23 2314  azithromycin (ZITHROMAX) 500 mg in sodium chloride 0.9 % 250 mL IVPB  Status:  Discontinued       Placed in "Followed by" Linked Group   500 mg 250 mL/hr over 60 Minutes Intravenous Every 24 hours 12/25/23 2315 12/25/23 2320   12/25/23 2030  cefTRIAXone (ROCEPHIN) 2 g in sodium chloride 0.9 % 100 mL IVPB  Status:  Discontinued        2 g 200 mL/hr over 30 Minutes Intravenous Every 24 hours 12/25/23 2027 12/27/23 1727   12/25/23 2030  azithromycin (ZITHROMAX) 500  mg in sodium chloride 0.9 % 250 mL IVPB  Status:  Discontinued        500 mg 250 mL/hr over 60 Minutes Intravenous Every 24 hours 12/25/23 2027 12/27/23 1108          Medications  acetaminophen  1,000 mg Oral Once   arformoterol  15 mcg Nebulization BID   benzonatate  200 mg Oral BID   budesonide (PULMICORT) nebulizer solution  0.25 mg Nebulization BID   clonazePAM  1 mg Oral q morning   clonazePAM  2 mg Oral QHS   cyclobenzaprine  5 mg Oral BID   DULoxetine  60 mg Oral Daily   enoxaparin (LOVENOX) injection  40 mg Subcutaneous Q24H   famotidine  20 mg Oral QHS  feeding supplement  1 Container Oral TID BM   fluticasone  2 spray Each Nare Daily   gabapentin  400 mg Oral TID   guaiFENesin  600 mg Oral BID   lamoTRIgine  200 mg Oral Daily   levofloxacin  500 mg Oral Daily   lidocaine  5 mL Nebulization BID   loratadine  10 mg Oral Daily   montelukast  10 mg Oral QHS   multivitamin with minerals  1 tablet Oral Daily   pantoprazole  40 mg Oral BID   polyethylene glycol  17 g Oral Daily   predniSONE  5 mg Oral Q breakfast   ramelteon  8 mg Oral QHS   revefenacin  175 mcg Nebulization Daily   scopolamine  1 patch Transdermal Q72H   sodium chloride flush  3 mL Intravenous Q12H   traZODone  300 mg Oral QHS      Subjective:   Linda [name] was seen and examined today.  Still coughing and wheezing today.  Appears to be short of breath.  No acute events overnight.  No fevers.    Objective:   Vitals:   01/08/24 0310 01/08/24 0740 01/08/24 0836 01/08/24 1055  BP: (!) 156/62 (!) 118/54    Pulse:  60    Resp: 16 17    Temp: 97.7 F (36.5 C) 97.7 F (36.5 C)    TempSrc: Oral Oral    SpO2: 99% 99% 98% 98%  Weight:      Height:        Intake/Output Summary (Last 24 hours) at 01/08/2024 1130 Last data filed at 01/08/2024 0900 Gross per 24 hour  Intake 240 ml  Output 2700 ml  Net -2460 ml     Wt Readings from Last 3 Encounters:  01/06/24 68.2 kg  12/23/23 70.9  kg  12/11/23 70.1 kg    Physical Exam General: Alert and oriented, frail and ill-appearing, coughing Cardiovascular: S1 S2 clear, RRR.  Respiratory: Diffuse wheezing bilaterally Gastrointestinal: Soft, nontender, nondistended, NBS Ext: no pedal edema bilaterally Neuro: no new deficits Psych: Normal affect     Data Reviewed:  I have personally reviewed following labs    CBC Lab Results  Component Value Date   WBC 10.6 (H) 01/07/2024   RBC 4.20 01/07/2024   HGB 11.6 (L) 01/07/2024   HCT 38.1 01/07/2024   MCV 90.7 01/07/2024   MCH 27.6 01/07/2024   PLT 196 01/07/2024   MCHC 30.4 01/07/2024   RDW 14.0 01/07/2024   LYMPHSABS 2.2 01/07/2024   MONOABS 0.8 01/07/2024   EOSABS 0.1 01/07/2024   BASOSABS 0.0 01/07/2024     Last metabolic panel Lab Results  Component Value Date   NA 136 01/07/2024   K 3.8 01/07/2024   CL 100 01/07/2024   CO2 31 01/07/2024   BUN 14 01/07/2024   CREATININE 0.84 01/07/2024   GLUCOSE 110 (H) 01/07/2024   GFRNONAA >60 01/07/2024   GFRAA >60 06/18/2020   CALCIUM 8.6 (L) 01/07/2024   PHOS 3.5 12/29/2023   PROT 5.7 (L) 01/02/2024   ALBUMIN 2.7 (L) 01/02/2024   BILITOT 0.7 01/02/2024   ALKPHOS 50 01/02/2024   AST 21 01/02/2024   ALT 21 01/02/2024   ANIONGAP 5 01/07/2024    CBG (last 3)  Recent Labs    01/05/24 1540 01/05/24 2113 01/06/24 0550  GLUCAP 182* 162* 102*      Coagulation Profile: No results for input(s): "INR", "PROTIME" in the last 168 hours.   Radiology Studies: I have  personally reviewed the imaging studies  CT SINUS WO CONTRAST Result Date: 01/07/2024 CLINICAL DATA:  Acute sinusitis EXAM: CT MAXILLOFACIAL WITHOUT CONTRAST TECHNIQUE: Multidetector CT images of the paranasal sinuses were obtained using the standard protocol without intravenous contrast. RADIATION DOSE REDUCTION: This exam was performed according to the departmental dose-optimization program which includes automated exposure control, adjustment of  the mA and/or kV according to patient size and/or use of iterative reconstruction technique. COMPARISON:  12/28/2023 FINDINGS: Paranasal sinuses: Frontal: Normally aerated. Patent frontal sinus drainage pathways. Ethmoid: Normally aerated. Maxillary: There is improved aeration of the maxillary sinuses. Fluid in mucosal thickening on the right have nearly resolved. There is moderate residual left mucosal thickening, but clearly improved since 12/28/2023. Sphenoid: Small amount of fluid in the sphenoid sinuses, but aeration is improved. The sphenoethmoidal recesses are patent. Right ostiomeatal unit: Occluded Left ostiomeatal unit: Occluded Nasal passages: Patent. Mild rightward deviation of the nasal septum with a small rightward projecting osseous spur. Anatomy: No pneumatization superior to anterior ethmoid notches. Symmetric and intact olfactory grooves and fovea ethmoidalis, Keros II (4-35mm). Sellar sphenoid pneumatization pattern. No dehiscence of carotid or optic canals. No onodi cell. Other: Orbits and intracranial compartment are unremarkable. Visible mastoid air cells are normally aerated. IMPRESSION: 1. Improved aeration of the maxillary sinuses and sphenoid sinuses since 12/28/2023. 2. Occluded ostiomeatal units bilaterally. 3. Mild rightward deviation of the nasal septum with a small rightward projecting osseous spur. Electronically Signed   By: Deatra Robinson M.D.   On: 01/07/2024 23:59       Delmi Fulfer M.D. [facility] 01/08/2024, 11:30 AM  Available via Epic secure chat 7am-7pm After 7 pm, please refer to night coverage provider listed on amion.

## 2024-01-08 NOTE — TOC Progression Note (Signed)
Transition of Care [facility]) - Progression Note    Patient Details  Name: Linda [name] MRN: [id number] Date of Birth: 17-Aug-1946  Transition of Care [facility]) CM/SW Contact  Leone Haven, RN Phone Number: 01/08/2024, 4:02 PM  Clinical Narrative:    NCM received consult for home hospice, NCM offered choice to patient , she chose  [facility].  NCM made referral to [facility].  Dennard Nip will call daughter who is at the bedside , [name] [phone] she lives in [place], patient's son [name] lives here and his phone is [phone].  Patient will need ambulance transport home at dc. She is on oxygen and has home oxygern with [employer], but [facility] will supply the DME.  The DME will have to be at the home prior to dc.    Expected Discharge Plan: Home w Home Health Services Barriers to Discharge: Continued Medical Work up  Expected Discharge Plan and Services   Discharge Planning Services: CM Consult Post Acute Care Choice: Home Health Living arrangements for the past 2 months: Independent Living Facility                 DME Arranged: N/A DME Agency: NA       HH Arranged: NA           Social Determinants of Health (SDOH) Interventions SDOH Screenings   Food Insecurity: No Food Insecurity (12/26/2023)  Housing: Low Risk  (12/26/2023)  Transportation Needs: Unmet Transportation Needs (12/26/2023)  Utilities: Not At Risk (12/26/2023)  Alcohol Screen: Low Risk  (01/01/2023)  Depression (PHQ2-9): Low Risk  (10/07/2023)  Recent Concern: Depression (PHQ2-9) - Medium Risk (08/05/2023)  Financial Resource Strain: Low Risk  (01/01/2023)  Physical Activity: Insufficiently Active (01/01/2023)  Social Connections: Unknown (12/26/2023)  Recent Concern: Social Connections - Moderately Isolated (12/26/2023)  Stress: No Stress Concern Present (01/01/2023)  Tobacco Use: Low Risk  (12/25/2023)    Readmission Risk Interventions    12/29/2023    4:37 PM 07/16/2023    9:36 AM  Readmission  Risk Prevention Plan  Transportation Screening Complete Complete  PCP or Specialist Appt within 3-5 Days Complete   Palliative Care Screening Not Applicable   Medication Review (RN Care Manager) Complete Complete  PCP or Specialist appointment within 3-5 days of discharge  Complete  HRI or Home Care Consult  Complete  SW Recovery Care/Counseling Consult  Complete  Palliative Care Screening  Not Applicable  Skilled Nursing Facility  Not Applicable

## 2024-01-08 NOTE — Progress Notes (Signed)
Palliative:  HPI:  78 y.o. female  with past medical history of asthma, asbestosis, diastolic CHF, chronic back pain, CAD, and chronic respiratory failure on 3L [place] admitted on 12/25/2023 from independent living with wheezing, SOB, and cough. Being treated for acute on chronic hypoxemic respiratory failure in setting of asbesosis with pleural plaques along with asthma.   I met today with Linda [name] and her daughter, Linda [name], at bedside. We had a long discussion reviewing course, labs, scans, and progression of disease. We discussed expectations and symptom burden. We discussed overall poor prognosis. We discussed CT scan with improvement in sinus infection but unfortunately symptoms have not really improved. She has had some relief from increased pain medication. She has had some lethargy and lower dose of pain medication tried this morning but she had severe pain. We discussed trying a lower dose of hycodan and see if she can still have adequate relief from pain and cough with lower dose to try and avoid lethargy - she agrees to try.   I had further discussion with Linda [name]. We had an honest conversation about poor prognosis and quality of life. We discussed use of hospice. They all agree with having hospice support. We discussed benefits of hospice and what they provide at home. I reassured Linda [name] that hospice will help her recognize signs of progression and where they are and what to expect. We discussed progression at end of life and what we can likely expect to be coming. I explained that hospice can also help her make the decision based on prognostication if it makes sense to stay here with hospice or ultimately for Linda [name] to take her home and care for her with hospice in [place].   All questions/concerns addressed to the best of my ability. Emotional support provided.   Exam: Alert, oriented. No distress. Poor reserve. Weakness and fatigue. Breathing regular, unlabored at rest. Ongoing cough but improved.     Plan: - DNR/DNI - Home with hospice  100 min  Yong Channel, NP Palliative Medicine Team Pager [phone] (Please see [email or web address] for schedule) Team Phone [phone]    Greater than 50%  of this time was spent counseling and coordinating care related to the above assessment and plan

## 2024-01-08 NOTE — Progress Notes (Signed)
Mobility Specialist Progress Note:   01/08/24 1130  Mobility  Activity Ambulated with assistance in hallway  Level of Assistance Minimal assist, patient does 75% or more  Assistive Device Four wheel walker  Distance Ambulated (ft) 350 ft  Activity Response Tolerated poorly (significant pain, premedicated)  Mobility Referral Yes  Mobility visit 1 Mobility  Mobility Specialist Start Time (ACUTE ONLY) 1130  Mobility Specialist Stop Time (ACUTE ONLY) 1145  Mobility Specialist Time Calculation (min) (ACUTE ONLY) 15 min   Pt agreeable to mobility session, however limited by significant pain today. C/o back pain, rib pain, R knee pain, and R arm pain. Pt was premedication prior to session. Asking for voltaren gel post-ambulation. VSS on RA, required minA at times d/t jerking movements d/t pain. Pt back in bed with all needs met, alarm on. RN updated.  Linda [name] Mobility Specialist Please contact via SecureChat or  Rehab office at [phone]

## 2024-01-08 NOTE — Progress Notes (Signed)
Physical Therapy Treatment Patient Details Name: Linda [name] MRN: [id number] DOB: [birth year] Today's Date: 01/08/2024   History of Present Illness Pt is a 78 y.o. female who presents 12/25/23 with asthma exacerbation. PMH - chronic bil knee pain due to osteoarthritis, chronic low back pain, anxiety/depression, osteoporosis, chf, copd, htn, fibromyalgia, PE.    PT Comments  Pt greeted in bed on 4L O2 via [place]. Pt reports she feels like her knees are getting stronger and was eager to walk in the hallway. Pt ambulated using rollator initially CGA increased to min-modA as she fatigued. Extensive time spent on pt/family education regarding  activity pacing, rest breaks, walking distances, and safety considerations with mobility. Pt will impulsively increase speed as she fatigues to complete the task sooner, facilitated a seated recovery on rollator mid-ambulation. Pt will continue to benefit from PT intervention to address endurance, transfer training specifically with getting up/down on rollator bench, improving functional mobility and safety with an AD. Pt 0/3 goals met due to slower progress than anticipated as well as pain and fatigue limiting progress. Goals revised.    If plan is discharge home, recommend the following: Assistance with cooking/housework;Assist for transportation;A little help with walking and/or transfers;A little help with bathing/dressing/bathroom;Help with stairs or ramp for entrance   Can travel by private vehicle        Equipment Recommendations       Recommendations for Other Services       Precautions / Restrictions Precautions Precautions: Fall;Other (comment) Precaution Comments: 3L O2 at all times Restrictions Weight Bearing Restrictions Per Provider Order: No     Mobility  Bed Mobility Overal bed mobility: Modified Independent             General bed mobility comments: HOB elevated    Transfers Overall transfer level: Needs  assistance Equipment used: Rollator (4 wheels) Transfers: Sit to/from Stand Sit to Stand: Min assist, Mod assist           General transfer comment: Pt required increased assistance with STS as she fatigued and when taking a rest break on the rollator. Pt required VC/TC to safely perform tasks. Educated pt on proper hand placement and sequencing of transfers.    Ambulation/Gait Ambulation/Gait assistance: Contact guard assist, Min assist, Mod assist Gait Distance (Feet): 450 Feet (1x300 from EOB to hallway prior to seated rest on rollator; 1x150 from hallway back to EOB) Assistive device: Rollator (4 wheels) Gait Pattern/deviations: Step-through pattern, Decreased stride length, Trunk flexed   Gait velocity interpretation: 1.31 - 2.62 ft/sec, indicative of limited community ambulator   General Gait Details: Pt demonstrated kyphotic flexed posture that she is able to correct with VC/TC, as she fatigued her trunk became more flexed. Pt displayed poor safety awareness allowing the rollator to get to far in front of her and rushing as she fatigued, blocked the device and facilitated a rest break in the hallway for pt to recover. ModA required to help pt navigae turning around to sit down on rollator and stand up and utilize rollator again. Pt is able to look around the hallways while ambulating without LOB. Pt inconsistently let go of the R handle of the rollator, had pt take a standing recovery period and educated her to maintain both hands on the rollator when ambulating d/t safety considerations.   Stairs             Wheelchair Mobility     Tilt Bed    Modified Rankin (Stroke Patients Only)  Balance Overall balance assessment: Mild deficits observed, not formally tested                                          Cognition Arousal: Alert Behavior During Therapy: WFL for tasks assessed/performed Overall Cognitive Status: Within Functional Limits for  tasks assessed                                          Exercises      General Comments        Pertinent Vitals/Pain Pain Assessment Pain Assessment: 0-10 Pain Score: 6  Faces Pain Scale: Hurts little more Pain Location: R flank/back and B knees Pain Descriptors / Indicators: Aching, Discomfort Pain Intervention(s): Monitored during session, Repositioned    Home Living                          Prior Function            PT Goals (current goals can now be found in the care plan section) Acute Rehab PT Goals Patient Stated Goal: I want to have less pain and move longer distances. PT Goal Formulation: With patient/family Time For Goal Achievement: 01/22/24 Potential to Achieve Goals: Good Progress towards PT goals: Goals updated    Frequency    Min 1X/week      PT Plan      Co-evaluation              AM-PAC PT "6 Clicks" Mobility   Outcome Measure  Help needed turning from your back to your side while in a flat bed without using bedrails?: None Help needed moving from lying on your back to sitting on the side of a flat bed without using bedrails?: A Little Help needed moving to and from a bed to a chair (including a wheelchair)?: A Little Help needed standing up from a chair using your arms (e.g., wheelchair or bedside chair)?: A Little Help needed to walk in hospital room?: A Little Help needed climbing 3-5 steps with a railing? : A Little 6 Click Score: 19    End of Session Equipment Utilized During Treatment: Gait belt;Oxygen Activity Tolerance: Patient limited by fatigue Patient left: in bed;with call bell/phone within reach;with family/visitor present Nurse Communication: Mobility status PT Visit Diagnosis: Unsteadiness on feet (R26.81);Other abnormalities of gait and mobility (R26.89);Muscle weakness (generalized) (M62.81)     Time: 4098-1191 PT Time Calculation (min) (ACUTE ONLY): 35 min  Charges:    $Gait  Training: 23-37 mins                      Cheri Guppy, PT, DPT Acute Rehab Services [phone]  Richardson Chiquito 01/08/2024, 4:26 PM

## 2024-01-09 ENCOUNTER — Telehealth: Payer: Self-pay | Admitting: Internal Medicine

## 2024-01-09 DIAGNOSIS — I10 Essential (primary) hypertension: Secondary | ICD-10-CM | POA: Diagnosis not present

## 2024-01-09 DIAGNOSIS — I5032 Chronic diastolic (congestive) heart failure: Secondary | ICD-10-CM | POA: Diagnosis not present

## 2024-01-09 DIAGNOSIS — J9621 Acute and chronic respiratory failure with hypoxia: Secondary | ICD-10-CM | POA: Diagnosis not present

## 2024-01-09 DIAGNOSIS — J4551 Severe persistent asthma with (acute) exacerbation: Secondary | ICD-10-CM | POA: Diagnosis not present

## 2024-01-09 MED ORDER — GABAPENTIN 400 MG PO CAPS: 400.0000 mg | ORAL_CAPSULE | Freq: Three times a day (TID) | ORAL | 1 refills | Status: DC

## 2024-01-09 MED ORDER — PREDNISONE 5 MG PO TABS: 5.0000 mg | ORAL_TABLET | Freq: Every day | ORAL | 2 refills | Status: DC

## 2024-01-09 MED ORDER — RAMELTEON 8 MG PO TABS: 8.0000 mg | ORAL_TABLET | Freq: Every day | ORAL | 0 refills | Status: DC

## 2024-01-09 MED ORDER — ARFORMOTEROL TARTRATE 15 MCG/2ML IN NEBU: 15.0000 ug | INHALATION_SOLUTION | Freq: Two times a day (BID) | RESPIRATORY_TRACT | 3 refills | Status: DC

## 2024-01-09 MED ORDER — LORATADINE 10 MG PO TABS: 10.0000 mg | ORAL_TABLET | Freq: Every day | ORAL | 0 refills | Status: DC

## 2024-01-09 MED ORDER — ONDANSETRON HCL 4 MG PO TABS: 4.0000 mg | ORAL_TABLET | Freq: Four times a day (QID) | ORAL | 0 refills | Status: DC | PRN

## 2024-01-09 MED ORDER — FAMOTIDINE 20 MG PO TABS: 20.0000 mg | ORAL_TABLET | Freq: Every day | ORAL | 3 refills | Status: DC

## 2024-01-09 MED ORDER — LEVOFLOXACIN 500 MG PO TABS: 500.0000 mg | ORAL_TABLET | Freq: Every day | ORAL | 0 refills | Status: AC

## 2024-01-09 MED ORDER — POLYETHYLENE GLYCOL 3350 17 G PO PACK: 17.0000 g | PACK | Freq: Every day | ORAL | 0 refills | Status: DC

## 2024-01-09 MED ORDER — HYDROCODONE BIT-HOMATROP MBR 5-1.5 MG/5ML PO SOLN: 5.0000 mL | Freq: Four times a day (QID) | ORAL | 0 refills | Status: DC | PRN

## 2024-01-09 MED ORDER — GUAIFENESIN ER 600 MG PO TB12: 600.0000 mg | ORAL_TABLET | Freq: Two times a day (BID) | ORAL | 3 refills | Status: DC

## 2024-01-09 MED ORDER — CLONAZEPAM 1 MG PO TABS: ORAL_TABLET | ORAL | 0 refills | Status: DC

## 2024-01-09 MED ORDER — SCOPOLAMINE 1 MG/3DAYS TD PT72: 1.0000 | MEDICATED_PATCH | TRANSDERMAL | 12 refills | Status: DC

## 2024-01-09 MED ORDER — BUDESONIDE 0.25 MG/2ML IN SUSP: 0.2500 mg | Freq: Two times a day (BID) | RESPIRATORY_TRACT | 12 refills | Status: DC

## 2024-01-09 MED ORDER — FLUTICASONE PROPIONATE 50 MCG/ACT NA SUSP: 2.0000 | Freq: Every day | NASAL | 3 refills | Status: DC

## 2024-01-09 MED ORDER — BENZONATATE 200 MG PO CAPS: 200.0000 mg | ORAL_CAPSULE | Freq: Two times a day (BID) | ORAL | 3 refills | Status: DC

## 2024-01-09 NOTE — TOC Transition Note (Signed)
Transition of Care [facility]) - Discharge Note   Patient Details  Name: Linda [name] MRN: [id number] Date of Birth: [birth year]  Transition of Care [facility]) CM/SW Contact:  Leone Haven, RN Phone Number: 01/09/2024, 12:23 PM   Clinical Narrative:    For dc today, NCM notified Cheri with [facility] and ptar has been scheduled.    Final next level of care: Home w Home Health Services Barriers to Discharge: Continued Medical Work up   Patient Goals and CMS Choice Patient states their goals for this hospitalization and ongoing recovery are:: return to [facility] CMS Medicare.gov Compare Post Acute Care list provided to:: Patient Choice offered to / list presented to : Patient      Discharge Placement                       Discharge Plan and Services Additional resources added to the After Visit Summary for     Discharge Planning Services: CM Consult Post Acute Care Choice: Home Health          DME Arranged: N/A DME Agency: NA       HH Arranged: NA          Social Drivers of Health (SDOH) Interventions SDOH Screenings   Food Insecurity: No Food Insecurity (12/26/2023)  Housing: Low Risk  (12/26/2023)  Transportation Needs: Unmet Transportation Needs (12/26/2023)  Utilities: Not At Risk (12/26/2023)  Alcohol Screen: Low Risk  (01/01/2023)  Depression (PHQ2-9): Low Risk  (10/07/2023)  Recent Concern: Depression (PHQ2-9) - Medium Risk (08/05/2023)  Financial Resource Strain: Low Risk  (01/01/2023)  Physical Activity: Insufficiently Active (01/01/2023)  Social Connections: Unknown (12/26/2023)  Recent Concern: Social Connections - Moderately Isolated (12/26/2023)  Stress: No Stress Concern Present (01/01/2023)  Tobacco Use: Low Risk  (12/25/2023)     Readmission Risk Interventions    12/29/2023    4:37 PM 07/16/2023    9:36 AM  Readmission Risk Prevention Plan  Transportation Screening Complete Complete  PCP or Specialist Appt within 3-5 Days Complete    Palliative Care Screening Not Applicable   Medication Review (RN Care Manager) Complete Complete  PCP or Specialist appointment within 3-5 days of discharge  Complete  HRI or Home Care Consult  Complete  SW Recovery Care/Counseling Consult  Complete  Palliative Care Screening  Not Applicable  Skilled Nursing Facility  Not Applicable

## 2024-01-09 NOTE — Telephone Encounter (Signed)
Talked to [facility] pharmacist, Asher Muir, the pharmacy is not in insurance network and recommended transferring prescriptions to [facility] per patient, Linda [name] wishes. Asher Muir will transfer all the prescriptions and I sent hycodan and klonopin directly to [facility]. Pharmacy will call patient or her son, Linda [name] when prescriptions are ready to be picked up. I called the patient and her son, Linda [name] to update the above but they did not pick up the phone.    Thad Ranger M.D.  [facility] 01/09/2024, 5:21 PM

## 2024-01-09 NOTE — TOC Progression Note (Signed)
Transition of Care [facility]) - Progression Note    Patient Details  Name: Linda [name] MRN: [id number] Date of Birth: 1946-12-20  Transition of Care [facility]) CM/SW Contact  Leone Haven, RN Phone Number: 01/09/2024, 11:53 AM  Clinical Narrative:    NCM spoke with son, he states his sister will be with patient for a week, they are working on getting somone to stay with her it will be him or his daughter, NCM informed him he could do private duty, he states know they do not want to do that it will be a family member.  She will need ptar ambulance.    Expected Discharge Plan: Home w Home Health Services Barriers to Discharge: Continued Medical Work up  Expected Discharge Plan and Services   Discharge Planning Services: CM Consult Post Acute Care Choice: Home Health Living arrangements for the past 2 months: Independent Living Facility Expected Discharge Date: 01/09/24               DME Arranged: N/A DME Agency: NA       HH Arranged: NA           Social Determinants of Health (SDOH) Interventions SDOH Screenings   Food Insecurity: No Food Insecurity (12/26/2023)  Housing: Low Risk  (12/26/2023)  Transportation Needs: Unmet Transportation Needs (12/26/2023)  Utilities: Not At Risk (12/26/2023)  Alcohol Screen: Low Risk  (01/01/2023)  Depression (PHQ2-9): Low Risk  (10/07/2023)  Recent Concern: Depression (PHQ2-9) - Medium Risk (08/05/2023)  Financial Resource Strain: Low Risk  (01/01/2023)  Physical Activity: Insufficiently Active (01/01/2023)  Social Connections: Unknown (12/26/2023)  Recent Concern: Social Connections - Moderately Isolated (12/26/2023)  Stress: No Stress Concern Present (01/01/2023)  Tobacco Use: Low Risk  (12/25/2023)    Readmission Risk Interventions    12/29/2023    4:37 PM 07/16/2023    9:36 AM  Readmission Risk Prevention Plan  Transportation Screening Complete Complete  PCP or Specialist Appt within 3-5 Days Complete   Palliative Care Screening Not  Applicable   Medication Review (RN Care Manager) Complete Complete  PCP or Specialist appointment within 3-5 days of discharge  Complete  HRI or Home Care Consult  Complete  SW Recovery Care/Counseling Consult  Complete  Palliative Care Screening  Not Applicable  Skilled Nursing Facility  Not Applicable

## 2024-01-09 NOTE — Discharge Summary (Signed)
Physician Discharge Summary   Patient: Linda [name] MRN: [id number] DOB: [birth year]  Admit date:     12/25/2023  Discharge date: 01/09/24  Discharge Physician: Thad Ranger, MD    PCP: Jeoffrey Massed, MD   Recommendations at discharge:   Currently multiple antihypertensives on hold due to hypotension, readjust medications and follow-up. Continue Levaquin 500 mg PO daily for 3 more days  Discharge Diagnoses:    Acute on chronic hypoxic respiratory failure (HCC) Acute sinusitis Asthma with acute exacerbation Restrictive lung disease in setting of asbestosis with pleural plaques   HTN (hypertension), benign   Chronic pain syndrome   Anxiety   CAD   Chronic diastolic CHF (congestive heart failure) (HCC)   Moderate asthma with acute exacerbation   Malnutrition of moderate degree   Hospital Course:   Patient is a 78 year old female with restrictive lung disease from asthma, asbestosis, pleural plaques, chronically on prednisone and 3-4 L O2 via [place], HTN, HLD, CHF, esophageal stricture status post prior dilation.  Patient had presented to ED on 1/2 with complaints of shortness of breath, wheezing, productive cough.  CT chest showed extensive bilateral calcified pleural plaques, increasing airspace disease in the medial right lower lobe suspicious for pneumonia. COVID, RSV, flu, RVP negative Patient had CT evidence showed acute sinusitis including bilateral maxillary and sphenoid sinus fluid. PCCM was consulted.  Hospital course prolonged due to persistent cough, shortness of breath despite maximal medical management.  Palliative medicine consulted, recommended hospice.  Assessment and Plan:   Acute on chronic hypoxic respiratory failure (HCC) Acute sinusitis Acute exacerbation of asthma Restrictive lung disease in setting of asbestosis with pleural plaques -Presented with worsening shortness of breath in the setting of pre-existing asthma, asbestosis.  Outpatient on  prednisone and O2 3 to 4 L via [place] -Initial suspicion for PNA and was started on IV Rocephin and Zithromax. -CT sinus 1/5 showed acute sinusitis including bilateral maxillary and sphenoid sinus fluid -Pulmonology was consulted.  Per pulmonology, VCD possible, less likely as she had laryngoscopy done to.  Recent SLP evaluation with MBS did not show obvious aspiration. - Patient seems to be nearing maximal treatment aimed at asthma, GERD, allergy and UACS. -PCCM recommended an empiric Levaquin to treat sinusitis -Continue outpatient dose of prednisone 5 mg daily -Continue Magic mouthwash for thrush and throat irritation.  -Continue Hycodan, gabapentin 400 mg 3 times daily -Continue Pulmicort, Rosalyn Gess, Yupelri, Claritin, Singulair  -Previous attending physician, Dr. Pola Corn discussed with ENT Dr. Irene Pap as suggested by [facility] and speech therapist.  ENT recommended outpatient follow-up. -Palliative medicine was consulted, patient is aware that she has been tried on maximal medical therapy without meaningful benefit, referred to hospice services at home. Patient in agreement.   -CT sinus 01/07/2024 showed improved variation of the maxillary sinuses and sphenoid sinuses, will continue levofloxacin to complete 14-day course. -Outpatient follow-up with Dr. Judeth Horn, pulmonology, scheduled    Anxiety -Currently on maximum dose of Cymbalta at 60 mg daily, Klonopin 1mg  AM, 2mg  at bed time, Lamictal 200 mg daily   CAD, HLD -On Crestor, aspirin prior to admission   Chronic diastolic heart failure Hypertension -Currently stable, euvolemic - PTA meds-Toprol 100 mg daily, amlodipine 5 mg daily, Imdur 30 mg twice daily, Lasix 20 mg daily -Meds were placed on hold due to hypotension - BP 129/74 at discharge. Resume/adjust medications outpatient    GERD Continue PPI    Chronic pain syndrome Continue oxycodone   Restless legs Continue Requip     Moderate  protein calorie malnutrition Nutrition  Problem: Moderate Malnutrition Etiology: acute illness Signs/Symptoms: mild fat depletion, moderate muscle depletion, energy intake < 75% for > 7 days Interventions: Ensure Enlive (each supplement provides 350kcal and 20 grams of protein), Liberalize Diet, MVI   Estimated body mass index is 25.81 kg/m as calculated from the following:   Height as of this encounter: 5\' 4"  (1.626 m).   Weight as of this encounter: 68.2 kg.        Pain control - [employer] Controlled Substance Reporting System database was reviewed. and patient was instructed, not to drive, operate heavy machinery, perform activities at heights, swimming or participation in water activities or provide baby-sitting services while on Pain, Sleep and Anxiety Medications; until their outpatient Physician has advised to do so again. Also recommended to not to take more than prescribed Pain, Sleep and Anxiety Medications.  Consultants: pulmonology, palliative medicine  Procedures performed:   Disposition: Home Diet recommendation:  Discharge Diet Orders (From admission, onward)     Start     Ordered   01/09/24 0000  Diet - low sodium heart healthy        01/09/24 1108            DISCHARGE MEDICATION: Allergies as of 01/09/2024       Reactions   Aripiprazole Rash, Other (See Comments)   Parkinsonism, also   Penicillins Shortness Of Breath, Itching, Swelling, Rash, Other (See Comments)   Tolerated Cefepime in ED   Bactrim [sulfamethoxazole-trimethoprim] Rash        Medication List     STOP taking these medications    ALPRAZolam 0.5 MG tablet Commonly known as: XANAX   amLODipine 5 MG tablet Commonly known as: NORVASC   aspirin 81 MG tablet   Breztri Aerosphere 160-9-4.8 MCG/ACT Aero Generic drug: Budeson-Glycopyrrol-Formoterol   buprenorphine 10 MCG/HR Ptwk Commonly known as: Butrans   furosemide 20 MG tablet Commonly known as: LASIX   isosorbide mononitrate 30 MG 24 hr tablet Commonly  known as: IMDUR   metoprolol succinate 100 MG 24 hr tablet Commonly known as: TOPROL-XL   rosuvastatin 40 MG tablet Commonly known as: CRESTOR   solifenacin 10 MG tablet Commonly known as: VESICARE       TAKE these medications    albuterol 108 (90 Base) MCG/ACT inhaler Commonly known as: VENTOLIN HFA Inhale 1-2 puffs into the lungs every 6 (six) hours as needed for wheezing or shortness of breath.   albuterol (2.5 MG/3ML) 0.083% nebulizer solution Commonly known as: PROVENTIL Take 3 mLs (2.5 mg total) by nebulization every 6 (six) hours as needed for wheezing or shortness of breath.   arformoterol 15 MCG/2ML Nebu Commonly known as: BROVANA Take 2 mLs (15 mcg total) by nebulization 2 (two) times daily.   benzonatate 200 MG capsule Commonly known as: TESSALON Take 1 capsule (200 mg total) by mouth 2 (two) times daily. What changed:  when to take this reasons to take this   budesonide 0.25 MG/2ML nebulizer solution Commonly known as: PULMICORT Take 2 mLs (0.25 mg total) by nebulization 2 (two) times daily.   clonazePAM 1 MG tablet Commonly known as: KLONOPIN Take 1 tablet (1 mg) PO daily in the morning and take 2 tablets (2 mg) daily at bedtime. Start taking on: January 10, 2024   cyclobenzaprine 5 MG tablet Commonly known as: FLEXERIL Take 5 mg by mouth 2 (two) times daily as needed.   diclofenac Sodium 1 % Gel Commonly known as: VOLTAREN Apply 2 g  topically 4 (four) times daily. 4 times a day to both knees What changed:  when to take this reasons to take this additional instructions   DULoxetine 60 MG capsule Commonly known as: CYMBALTA Take 1 capsule (60 mg total) by mouth daily.   EPINEPHrine 0.3 mg/0.3 mL Soaj injection Commonly known as: EPI-PEN Inject 0.3 mg into the muscle as needed for anaphylaxis.   famotidine 20 MG tablet Commonly known as: PEPCID Take 1 tablet (20 mg total) by mouth at bedtime.   fluticasone 50 MCG/ACT nasal spray Commonly  known as: FLONASE Place 2 sprays into both nostrils at bedtime. What changed: how much to take   gabapentin 400 MG capsule Commonly known as: NEURONTIN Take 1 capsule (400 mg total) by mouth 3 (three) times daily. What changed:  medication strength how much to take when to take this   guaiFENesin 600 MG 12 hr tablet Commonly known as: MUCINEX Take 1 tablet (600 mg total) by mouth 2 (two) times daily.   HYDROcodone bit-homatropine 5-1.5 MG/5ML syrup Commonly known as: Hycodan Take 5 mLs by mouth every 6 (six) hours as needed for cough.   lamoTRIgine 200 MG tablet Commonly known as: LAMICTAL TAKE ONE TABLET BY MOUTH DAILY   levofloxacin 500 MG tablet Commonly known as: LEVAQUIN Take 1 tablet (500 mg total) by mouth daily for 3 days.   loratadine 10 MG tablet Commonly known as: CLARITIN Take 1 tablet (10 mg total) by mouth daily. What changed:  when to take this reasons to take this   montelukast 10 MG tablet Commonly known as: SINGULAIR Take 1 tablet (10 mg total) by mouth at bedtime. What changed: when to take this   multivitamin with minerals Tabs tablet Take 1 tablet by mouth daily with breakfast.   nitroGLYCERIN 0.4 MG SL tablet Commonly known as: NITROSTAT Place 1 tablet (0.4 mg total) under the tongue every 5 (five) minutes as needed for chest pain (x 3 doses).   ondansetron 4 MG tablet Commonly known as: ZOFRAN Take 1 tablet (4 mg total) by mouth every 6 (six) hours as needed for nausea or vomiting.   oxyCODONE-acetaminophen 10-325 MG tablet Commonly known as: Percocet Take 1 tablet by mouth every 6 (six) hours as needed for pain.   OXYGEN Inhale 3 L/min into the lungs continuous.   pantoprazole 40 MG tablet Commonly known as: PROTONIX TAKE ONE TABLET BY MOUTH TWICE DAILY   polyethylene glycol 17 g packet Commonly known as: MIRALAX / GLYCOLAX Take 17 g by mouth daily. Start taking on: January 10, 2024   predniSONE 5 MG tablet Commonly known as:  DELTASONE Take 1 tablet (5 mg total) by mouth daily with breakfast. What changed:  how much to take how to take this when to take this additional instructions   ramelteon 8 MG tablet Commonly known as: ROZEREM Take 1 tablet (8 mg total) by mouth at bedtime. For sleep   rOPINIRole 0.5 MG tablet Commonly known as: REQUIP TAKE ONE TABLET BY MOUTH AT BEDTIME What changed:  when to take this reasons to take this additional instructions   scopolamine 1 MG/3DAYS Commonly known as: TRANSDERM-SCOP Place 1 patch (1.5 mg total) onto the skin every 3 (three) days. Start taking on: January 11, 2024   Spacer/Aero-Holding Rudean Curt 1 puff by Does not apply route 2 (two) times daily.   Tezspire 210 MG/1. syringe Generic drug: tezepelumab-ekko Inject 210 mg into the skin every 28 (twenty-eight) days.   traZODone 50 MG tablet Commonly  known as: DESYREL TAKE 2-4 TABLETS BY MOUTH AT BEDTIME AS NEEDED INSOMNIA What changed: See the new instructions.               Durable Medical Equipment  (From admission, onward)           Start     Ordered   12/29/23 1546  For home use only DME Bedside commode  Once       Question:  Patient needs a bedside commode to treat with the following condition  Answer:  Weakness   12/29/23 1546            Follow-up Information     [facility] Follow up.   Specialty: PALLIATIVE CARE Why: outpatient palliative services Contact information: [address] [facility] [place] [address] [phone]        [facility] Follow up.   Why: Bedside commode Contact information: [phone]        Hunsucker, Lesia Sago, MD Follow up on 02/03/2024.   Specialty: Pulmonary Disease Why: At 1 PM, for hospital follow-up Contact information: [address] [place] [address] [phone]         Ashok Croon, MD. Schedule an appointment as soon as possible for a visit in 2 week(s).    Specialty: Otolaryngology Why: for hospital follow-up Contact information: [address] [place] [address] [place] [address] [phone]                Discharge Exam: Ceasar Mons Weights   01/04/24 0536 01/05/24 0559 01/06/24 0430  Weight: 70.2 kg 69.8 kg 68.2 kg   S: No acute complaints. No fevers, chest pain, nausea or vomiting   BP (!) 144/70 (BP Location: Left Arm)   Pulse 64   Temp 97.6 F (36.4 C) (Oral)   Resp 20   Ht 5\' 4"  (1.626 m)   Wt 68.2 kg   SpO2 97%   BMI 25.81 kg/m    Physical Exam General: Alert and oriented x 3, NAD Cardiovascular: S1 S2 clear, RRR.  Respiratory: diminished BS with scattered wheezing  Gastrointestinal: Soft, nontender, nondistended, NBS Ext: no pedal edema bilaterally Neuro: no new deficits Psych: Normal affect    Condition at discharge: guarded   The results of significant diagnostics from this hospitalization (including imaging, microbiology, ancillary and laboratory) are listed below for reference.   Imaging Studies: CT SINUS WO CONTRAST Result Date: 01/07/2024 CLINICAL DATA:  Acute sinusitis EXAM: CT MAXILLOFACIAL WITHOUT CONTRAST TECHNIQUE: Multidetector CT images of the paranasal sinuses were obtained using the standard protocol without intravenous contrast. RADIATION DOSE REDUCTION: This exam was performed according to the departmental dose-optimization program which includes automated exposure control, adjustment of the mA and/or kV according to patient size and/or use of iterative reconstruction technique. COMPARISON:  12/28/2023 FINDINGS: Paranasal sinuses: Frontal: Normally aerated. Patent frontal sinus drainage pathways. Ethmoid: Normally aerated. Maxillary: There is improved aeration of the maxillary sinuses. Fluid in mucosal thickening on the right have nearly resolved. There is moderate residual left mucosal thickening, but clearly improved since 12/28/2023. Sphenoid: Small amount of fluid in the sphenoid sinuses, but  aeration is improved. The sphenoethmoidal recesses are patent. Right ostiomeatal unit: Occluded Left ostiomeatal unit: Occluded Nasal passages: Patent. Mild rightward deviation of the nasal septum with a small rightward projecting osseous spur. Anatomy: No pneumatization superior to anterior ethmoid notches. Symmetric and intact olfactory grooves and fovea ethmoidalis, Keros II (4-43mm). Sellar sphenoid pneumatization pattern. No dehiscence of carotid or  optic canals. No onodi cell. Other: Orbits and intracranial compartment are unremarkable. Visible mastoid air cells are normally aerated. IMPRESSION: 1. Improved aeration of the maxillary sinuses and sphenoid sinuses since 12/28/2023. 2. Occluded ostiomeatal units bilaterally. 3. Mild rightward deviation of the nasal septum with a small rightward projecting osseous spur. Electronically Signed   By: Deatra Robinson M.D.   On: 01/07/2024 23:59   DG Chest Port 1 View Result Date: 12/30/2023 CLINICAL DATA:  Pneumonia. EXAM: PORTABLE CHEST 1 VIEW COMPARISON:  December 25, 2023. FINDINGS: Stable cardiomediastinal silhouette. Stable bilateral calcified pleural plaques. No acute pulmonary disease. Bony thorax is unremarkable. IMPRESSION: Bilateral calcified pleural plaques consistent with asbestos exposure. No acute pulmonary disease. Electronically Signed   By: Lupita Raider M.D.   On: 12/30/2023 10:09   ECHOCARDIOGRAM COMPLETE Result Date: 12/28/2023    ECHOCARDIOGRAM REPORT   Patient Name:   Linda [name] Date of Exam: 12/28/2023 Medical Rec #:  [id number]          Height:       64.0 in Accession #:    [id number]         Weight:       155.4 lb Date of Birth:  10-20-1946          BSA:          1.758 m Patient Age:    77 years           BP:           150/76 mmHg Patient Gender: F                  HR:           64 bpm. Exam Location:  Inpatient Procedure: 2D Echo, Color Doppler and Cardiac Doppler Indications:    Acute Respiratoy distress  History:        Patient has  prior history of Echocardiogram examinations, most                 recent 02/14/2022. CHF, CAD, COPD and Pulmonary Embolism,                 Arrythmias:RBBB, Signs/Symptoms:Syncope; Risk Factors:Former                 Smoker, Hypertension and Sleep Apnea.  Sonographer:    Raeford Razor RDCS Referring Phys: [phone] [facility]  Sonographer Comments: Image acquisition challenging due to respiratory motion. IMPRESSIONS  1. Left ventricular ejection fraction, by estimation, is >75%. The left ventricle has hyperdynamic function. The left ventricle has no regional wall motion abnormalities. There is mild left ventricular hypertrophy. Left ventricular diastolic parameters are consistent with Grade I diastolic dysfunction (impaired relaxation). Elevated left atrial pressure.  2. Right ventricular systolic function is normal. The right ventricular size is normal.  3. Left atrial size was moderately dilated.  4. The mitral valve is abnormal. Mild mitral valve regurgitation. No evidence of mitral stenosis.  5. The aortic valve is tricuspid. Aortic valve regurgitation is not visualized. No aortic stenosis is present.  6. The inferior vena cava is normal in size with greater than 50% respiratory variability, suggesting right atrial pressure of 3 mmHg. FINDINGS  Left Ventricle: Left ventricular ejection fraction, by estimation, is >75%. The left ventricle has hyperdynamic function. The left ventricle has no regional wall motion abnormalities. The left ventricular internal cavity size was normal in size. There is mild left ventricular hypertrophy. Left ventricular diastolic parameters are consistent with Grade I  diastolic dysfunction (impaired relaxation). Elevated left atrial pressure. Right Ventricle: The right ventricular size is normal. Right vetricular wall thickness was not well visualized. Right ventricular systolic function is normal. Left Atrium: Left atrial size was moderately dilated. Right Atrium: Right atrial size was  normal in size. Pericardium: There is no evidence of pericardial effusion. Mitral Valve: The mitral valve is abnormal. Mild mitral valve regurgitation. No evidence of mitral valve stenosis. Tricuspid Valve: The tricuspid valve is not well visualized. Tricuspid valve regurgitation is not demonstrated. No evidence of tricuspid stenosis. Aortic Valve: The aortic valve is tricuspid. Aortic valve regurgitation is not visualized. No aortic stenosis is present. Aortic valve mean gradient measures 4.0 mmHg. Aortic valve peak gradient measures 7.4 mmHg. Aortic valve area, by VTI measures 3.07 cm. Pulmonic Valve: The pulmonic valve was not well visualized. Pulmonic valve regurgitation is mild. No evidence of pulmonic stenosis. Aorta: The aortic root and ascending aorta are structurally normal, with no evidence of dilitation. Venous: The inferior vena cava is normal in size with greater than 50% respiratory variability, suggesting right atrial pressure of 3 mmHg. IAS/Shunts: The interatrial septum was not well visualized.  LEFT VENTRICLE PLAX 2D LVIDd:         4.00 cm     Diastology LVIDs:         1.80 cm     LV e' medial:    4.13 cm/s LV PW:         0.90 cm     LV E/e' medial:  23.6 LV IVS:        1.10 cm     LV e' lateral:   5.77 cm/s LVOT diam:     2.00 cm     LV E/e' lateral: 16.9 LV SV:         95 LV SV Index:   54 LVOT Area:     3.14 cm  LV Volumes (MOD) LV vol d, MOD A2C: 73.1 ml LV vol d, MOD A4C: 74.7 ml LV vol s, MOD A2C: 26.7 ml LV vol s, MOD A4C: 25.6 ml LV SV MOD A2C:     46.4 ml LV SV MOD A4C:     74.7 ml LV SV MOD BP:      47.1 ml RIGHT VENTRICLE             IVC RV Basal diam:  3.00 cm     IVC diam: 1.90 cm RV S prime:     13.70 cm/s TAPSE (M-mode): 1.4 cm LEFT ATRIUM             Index        RIGHT ATRIUM           Index LA diam:        4.40 cm 2.50 cm/m   RA Area:     12.80 cm LA Vol (A2C):   70.5 ml 40.11 ml/m  RA Volume:   28.60 ml  16.27 ml/m LA Vol (A4C):   68.7 ml 39.09 ml/m LA Biplane Vol: 74.7 ml  42.50 ml/m  AORTIC VALVE AV Area (Vmax):    3.05 cm AV Area (Vmean):   2.97 cm AV Area (VTI):     3.07 cm AV Vmax:           136.00 cm/s AV Vmean:          93.700 cm/s AV VTI:            0.308 m AV Peak Grad:  7.4 mmHg AV Mean Grad:      4.0 mmHg LVOT Vmax:         132.00 cm/s LVOT Vmean:        88.600 cm/s LVOT VTI:          0.301 m LVOT/AV VTI ratio: 0.98  AORTA Ao Root diam: 2.80 cm Ao Asc diam:  3.40 cm MITRAL VALVE MV Area (PHT): 2.48 cm     SHUNTS MV Decel Time: 306 msec     Systemic VTI:  0.30 m MV E velocity: 97.50 cm/s   Systemic Diam: 2.00 cm MV A velocity: 124.00 cm/s MV E/A ratio:  0.79 Dina Rich MD Electronically signed by Dina Rich MD Signature Date/Time: 12/28/2023/1:45:35 PM    Final    CT SINUS WO CONTRAST Result Date: 12/28/2023 CLINICAL DATA:  Sinusitis. EXAM: CT MAXILLOFACIAL WITHOUT CONTRAST TECHNIQUE: Multidetector CT images of the paranasal sinuses were obtained using the standard protocol without intravenous contrast. RADIATION DOSE REDUCTION: This exam was performed according to the departmental dose-optimization program which includes automated exposure control, adjustment of the mA and/or kV according to patient size and/or use of iterative reconstruction technique. COMPARISON:  Maxillofacial CT 07/12/2023 FINDINGS: Paranasal sinuses: Frontal: Normally aerated. Patent frontal sinus drainage pathways. Ethmoid: Mild mucosal thickening bilaterally, left greater than right. Maxillary: Circumferential mucosal thickening and moderate to large fluid levels bilaterally. Sphenoid: Subtotal opacification of the right sphenoid sinus by mucosal thickening and fluid. Minimal fluid in the left sphenoid sinus. Effacement of the sphenoid sinus ostia bilaterally. Partial effacement of the sphenoethmoidal recesses, right greater than left. Right ostiomeatal unit: Opacification of the maxillary sinus ostium and infundibulum. Left ostiomeatal unit: Opacification of the maxillary sinus  ostium and proximal infundibulum. Nasal passages: Asymmetric left inferior nasal turbinate hypertrophy with adjacent mucosal thickening or secretions in the nasal cavity. Paradoxical rotation of the middle turbinates. 3 mm rightward deviation of the nasal septum. Anatomy: No pneumatization superior to anterior ethmoid notches. Keros II. Sellar sphenoid pneumatization pattern. No dehiscence of carotid or optic canals. No onodi cell. Other: Clear mastoid air cells and middle ear cavities. Small amount of material in the right external auditory canal, likely cerumen. Bilateral cataract extraction. IMPRESSION: Findings compatible with acute sinusitis including bilateral maxillary and sphenoid sinus fluid. Electronically Signed   By: Sebastian Ache M.D.   On: 12/28/2023 12:05   CT Chest Wo Contrast Result Date: 12/25/2023 CLINICAL DATA:  Shortness of breath. EXAM: CT CHEST WITHOUT CONTRAST TECHNIQUE: Multidetector CT imaging of the chest was performed following the standard protocol without IV contrast. RADIATION DOSE REDUCTION: This exam was performed according to the departmental dose-optimization program which includes automated exposure control, adjustment of the mA and/or kV according to patient size and/or use of iterative reconstruction technique. COMPARISON:  Radiograph earlier today.  PET CT 10/08/2023 FINDINGS: Cardiovascular: The heart is upper normal in size. No pericardial effusion. Dense coronary artery calcifications. Moderate aortic atherosclerosis. No aortic aneurysm. Mediastinum/Nodes: Patulous esophagus. Small hiatal hernia. Unchanged 11 mm anterior paratracheal node series 2, image 22. No axillary adenopathy. Subcentimeter right thyroid nodule. Not clinically significant; no follow-up imaging recommended (ref: J Am Coll Radiol. 2015 Feb;12(2): 143-50). Lungs/Pleura: Extensive bilateral calcified pleural plaques, without significant change from prior exam. Associated subpleural reticulation and  scarring within the lateral left lower hemithorax. There is increasing rounded airspace disease adjacent to a pleural plaque in the medial right lower lobe series 7, image 69. left posterior pleural thickening without significant effusion. No pulmonary edema. Upper Abdomen: No  acute findings.  Gallstones. Musculoskeletal: Chronic compression deformities of T1, T7, T9 and T12, unchanged from prior exam no acute osseous findings. IMPRESSION: 1. Extensive bilateral calcified pleural plaques, without significant change from prior exam. Associated subpleural reticulation and scarring within the lateral left lower hemithorax. 2. Increasing rounded airspace disease adjacent to a pleural plaque in the medial right lower lobe, suspicious for pneumonia. Recommend CT follow-up in 6-8 weeks after course of treatment. 3. Unchanged mildly enlarged anterior paratracheal node. 4. Coronary artery calcifications. 5. Cholelithiasis. Aortic Atherosclerosis (ICD10-I70.0). Electronically Signed   By: Narda Rutherford M.D.   On: 12/25/2023 21:53   DG Chest Port 1 View Result Date: 12/25/2023 CLINICAL DATA:  Shortness of breath. EXAM: PORTABLE CHEST 1 VIEW COMPARISON:  Chest radiographs 08/28/2023 and 07/18/2023; PET-CT 10/08/2023 FINDINGS: Redemonstration of multiple calcified bilateral pleural plaques. Moderately decreased lung volumes, decreased from 08/28/2023. Mild left-greater-than-right lower hemithorax ground-glass opacities are similar to prior radiographs and CT given differences in lung volumes and technique. No pneumothorax. No large pleural effusion is seen however the basilar opacities and pleural plaques limit this evaluation. Mild dextrocurvature of the midthoracic spine with mild-to-moderate multilevel degenerative disc changes. Visualized cardiac silhouette is unremarkable. Moderate to high-grade atherosclerotic calcifications within the aortic arch. IMPRESSION: 1. Redemonstration of multiple calcified bilateral pleural  plaques. 2. Moderately decreased lung volumes, decreased from 08/28/2023. 3. Mild left-greater-than-right lower hemithorax ground-glass opacities are similar to prior radiographs and CT given differences in lung volumes and technique. This likely reflects a combination of subsegmental atelectasis, pleural plaques, and scarring. Electronically Signed   By: Neita Garnet M.D.   On: 12/25/2023 16:56    Microbiology: Results for orders placed or performed during the hospital encounter of 12/25/23  Resp panel by RT-PCR (RSV, Flu A&B, Covid) Anterior Nasal Swab     Status: None   Collection Time: 12/25/23  2:57 PM   Specimen: Anterior Nasal Swab  Result Value Ref Range Status   SARS Coronavirus 2 by RT PCR NEGATIVE NEGATIVE Final   Influenza A by PCR NEGATIVE NEGATIVE Final   Influenza B by PCR NEGATIVE NEGATIVE Final    Comment: (NOTE) The Xpert Xpress SARS-CoV-2/FLU/RSV plus assay is intended as an aid in the diagnosis of influenza from Nasopharyngeal swab specimens and should not be used as a sole basis for treatment. Nasal washings and aspirates are unacceptable for Xpert Xpress SARS-CoV-2/FLU/RSV testing.  Fact Sheet for Patients: BloggerCourse.com  Fact Sheet for Healthcare Providers: SeriousBroker.it  This test is not yet approved or cleared by the [place] FDA and has been authorized for detection and/or diagnosis of SARS-CoV-2 by FDA under an Emergency Use Authorization (EUA). This EUA will remain in effect (meaning this test can be used) for the duration of the COVID-19 declaration under Section 564(b)(1) of the Act, 21 U.S.C. section 360bbb-3(b)(1), unless the authorization is terminated or revoked.     Resp Syncytial Virus by PCR NEGATIVE NEGATIVE Final    Comment: (NOTE) Fact Sheet for Patients: BloggerCourse.com  Fact Sheet for Healthcare  Providers: SeriousBroker.it  This test is not yet approved or cleared by the [place] FDA and has been authorized for detection and/or diagnosis of SARS-CoV-2 by FDA under an Emergency Use Authorization (EUA). This EUA will remain in effect (meaning this test can be used) for the duration of the COVID-19 declaration under Section 564(b)(1) of the Act, 21 U.S.C. section 360bbb-3(b)(1), unless the authorization is terminated or revoked.  Performed at [facility], [address] [place],  [place] [address]   Respiratory (~20 pathogens) panel by PCR     Status: None   Collection Time: 12/25/23  8:27 PM   Specimen: Nasopharyngeal Swab; Respiratory  Result Value Ref Range Status   Adenovirus NOT DETECTED NOT DETECTED Final   Coronavirus 229E NOT DETECTED NOT DETECTED Final    Comment: (NOTE) The Coronavirus on the Respiratory Panel, DOES NOT test for the novel  Coronavirus (2019 nCoV)    Coronavirus HKU1 NOT DETECTED NOT DETECTED Final   Coronavirus NL63 NOT DETECTED NOT DETECTED Final   Coronavirus OC43 NOT DETECTED NOT DETECTED Final   Metapneumovirus NOT DETECTED NOT DETECTED Final   Rhinovirus / Enterovirus NOT DETECTED NOT DETECTED Final   Influenza A NOT DETECTED NOT DETECTED Final   Influenza B NOT DETECTED NOT DETECTED Final   Parainfluenza Virus 1 NOT DETECTED NOT DETECTED Final   Parainfluenza Virus 2 NOT DETECTED NOT DETECTED Final   Parainfluenza Virus 3 NOT DETECTED NOT DETECTED Final   Parainfluenza Virus 4 NOT DETECTED NOT DETECTED Final   Respiratory Syncytial Virus NOT DETECTED NOT DETECTED Final   Bordetella pertussis NOT DETECTED NOT DETECTED Final   Bordetella Parapertussis NOT DETECTED NOT DETECTED Final   Chlamydophila pneumoniae NOT DETECTED NOT DETECTED Final   Mycoplasma pneumoniae NOT DETECTED NOT DETECTED Final    Comment: Performed at [facility], [address], [place], [place] [address]  MRSA Next Gen by  PCR, Nasal     Status: None   Collection Time: 12/25/23 11:15 PM   Specimen: Nasal Mucosa; Nasal Swab  Result Value Ref Range Status   MRSA by PCR Next Gen NOT DETECTED NOT DETECTED Final    Comment: (NOTE) The GeneXpert MRSA Assay (FDA approved for NASAL specimens only), is one component of a comprehensive MRSA colonization surveillance program. It is not intended to diagnose MRSA infection nor to guide or monitor treatment for MRSA infections. Test performance is not FDA approved in patients less than 68 years old. Performed at [facility], [address], [place], [place] [address]    *Note: Due to a large number of results and/or encounters for the requested time period, some results have not been displayed. A complete set of results can be found in Results Review.    Labs: CBC: Recent Labs  Lab 01/07/24 0257  WBC 10.6*  NEUTROABS 7.4  HGB 11.6*  HCT 38.1  MCV 90.7  PLT 196   Basic Metabolic Panel: Recent Labs  Lab 01/07/24 0257  NA 136  K 3.8  CL 100  CO2 31  GLUCOSE 110*  BUN 14  CREATININE 0.84  CALCIUM 8.6*   Liver Function Tests: No results for input(s): "AST", "ALT", "ALKPHOS", "BILITOT", "PROT", "ALBUMIN" in the last 168 hours. CBG: Recent Labs  Lab 01/05/24 0555 01/05/24 1051 01/05/24 1540 01/05/24 2113 01/06/24 0550  GLUCAP 82 123* 182* 162* 102*    Discharge time spent: greater than 30 minutes.  Signed: Thad Ranger, MD [facility] 01/09/2024

## 2024-01-09 NOTE — Progress Notes (Addendum)
   This pt was referred to hospice services at home. I have reviewed the chart and tried to contact the daughter to discuss services and needs at home. I am awaiting a return call from her to proceed with referral workup.   940am Pt's daughter called [place] back and they are in agreement with hospice services and do not feel that any additional equipment will be needed at this time.   Pt has been approved by our MD for hospice services at home.     Norm Parcel RN [phone]

## 2024-01-12 ENCOUNTER — Telehealth: Payer: Self-pay

## 2024-01-12 NOTE — Transitions of Care (Post Inpatient/ED Visit) (Signed)
   01/12/2024  Name: Linda [name] MRN: [id number] DOB: [birth year]  Today's TOC FU Call Status:   Patient's Name and Date of Birth confirmed.  Transition Care Management Follow-up Telephone Call    Son called back and states that patient is admitted to hospice and the RN assigned is coming out today.  Son denies any additional needs. I provided my contact information if son needs to call me back.     Lonia Chimera, RN, BSN, CEN [employer]- Transition of Care Team.  [facility] [phone]

## 2024-01-12 NOTE — Transitions of Care (Post Inpatient/ED Visit) (Signed)
   01/12/2024  Name: Linda [name] MRN: [id number] DOB: 08/31/1946  Today's TOC FU Call Status: Today's TOC FU Call Status:: Unsuccessful Call (1st Attempt) Unsuccessful Call (1st Attempt) Date: 01/12/24  Attempted to reach the patient regarding the most recent Inpatient/ED visit.  Placed call to patient with no answer. Placed call to son and left a message. EMR states patient discharged with hospice. Placed call to [facility] and was notified that patient is in pre admit and will be assessed today.   Follow Up Plan: Additional outreach attempts will be made to reach the patient to complete the Transitions of Care (Post Inpatient/ED visit) call.   Lonia Chimera, RN, BSN, CEN [employer]- Transition of Care Team.  [facility] [phone]

## 2024-01-14 ENCOUNTER — Ambulatory Visit: Payer: Medicare Other

## 2024-01-18 ENCOUNTER — Other Ambulatory Visit: Payer: Self-pay | Admitting: Pulmonary Disease

## 2024-01-20 MED ORDER — TEZEPELUMAB-EKKO 210 MG/1.91ML ~~LOC~~ SOSY: 210.0000 mg | PREFILLED_SYRINGE | Freq: Once | SUBCUTANEOUS | Status: DC

## 2024-01-24 DIAGNOSIS — J309 Allergic rhinitis, unspecified: Secondary | ICD-10-CM | POA: Diagnosis not present

## 2024-01-24 DIAGNOSIS — G2581 Restless legs syndrome: Secondary | ICD-10-CM | POA: Diagnosis not present

## 2024-01-24 DIAGNOSIS — I5032 Chronic diastolic (congestive) heart failure: Secondary | ICD-10-CM | POA: Diagnosis not present

## 2024-01-24 DIAGNOSIS — K219 Gastro-esophageal reflux disease without esophagitis: Secondary | ICD-10-CM | POA: Diagnosis not present

## 2024-01-24 DIAGNOSIS — F32A Depression, unspecified: Secondary | ICD-10-CM | POA: Diagnosis not present

## 2024-01-24 DIAGNOSIS — I2510 Atherosclerotic heart disease of native coronary artery without angina pectoris: Secondary | ICD-10-CM | POA: Diagnosis not present

## 2024-01-24 DIAGNOSIS — J4540 Moderate persistent asthma, uncomplicated: Secondary | ICD-10-CM | POA: Diagnosis not present

## 2024-01-24 DIAGNOSIS — M1990 Unspecified osteoarthritis, unspecified site: Secondary | ICD-10-CM | POA: Diagnosis not present

## 2024-01-24 DIAGNOSIS — F319 Bipolar disorder, unspecified: Secondary | ICD-10-CM | POA: Diagnosis not present

## 2024-01-24 DIAGNOSIS — Z86711 Personal history of pulmonary embolism: Secondary | ICD-10-CM | POA: Diagnosis not present

## 2024-01-24 DIAGNOSIS — J9611 Chronic respiratory failure with hypoxia: Secondary | ICD-10-CM | POA: Diagnosis not present

## 2024-01-24 DIAGNOSIS — J398 Other specified diseases of upper respiratory tract: Secondary | ICD-10-CM | POA: Diagnosis not present

## 2024-01-24 DIAGNOSIS — G8929 Other chronic pain: Secondary | ICD-10-CM | POA: Diagnosis not present

## 2024-01-24 DIAGNOSIS — M797 Fibromyalgia: Secondary | ICD-10-CM | POA: Diagnosis not present

## 2024-01-24 DIAGNOSIS — J920 Pleural plaque with presence of asbestos: Secondary | ICD-10-CM | POA: Diagnosis not present

## 2024-01-24 DIAGNOSIS — F419 Anxiety disorder, unspecified: Secondary | ICD-10-CM | POA: Diagnosis not present

## 2024-01-24 DIAGNOSIS — J984 Other disorders of lung: Secondary | ICD-10-CM | POA: Diagnosis not present

## 2024-02-03 ENCOUNTER — Encounter: Payer: Self-pay | Admitting: Pulmonary Disease

## 2024-02-03 ENCOUNTER — Ambulatory Visit: Payer: Medicare Other | Admitting: Pulmonary Disease

## 2024-02-04 ENCOUNTER — Encounter: Payer: Medicare Other | Attending: Physical Medicine & Rehabilitation | Admitting: Registered Nurse

## 2024-02-04 DIAGNOSIS — Z5181 Encounter for therapeutic drug level monitoring: Secondary | ICD-10-CM | POA: Insufficient documentation

## 2024-02-04 DIAGNOSIS — G894 Chronic pain syndrome: Secondary | ICD-10-CM | POA: Insufficient documentation

## 2024-02-04 DIAGNOSIS — Z79899 Other long term (current) drug therapy: Secondary | ICD-10-CM | POA: Insufficient documentation

## 2024-02-19 ENCOUNTER — Ambulatory Visit (HOSPITAL_COMMUNITY)
Admission: RE | Admit: 2024-02-19 | Payer: Medicare Other | Source: Ambulatory Visit | Attending: Cardiology | Admitting: Cardiology

## 2024-02-21 DIAGNOSIS — J984 Other disorders of lung: Secondary | ICD-10-CM | POA: Diagnosis not present

## 2024-02-21 DIAGNOSIS — J4540 Moderate persistent asthma, uncomplicated: Secondary | ICD-10-CM | POA: Diagnosis not present

## 2024-02-21 DIAGNOSIS — G8929 Other chronic pain: Secondary | ICD-10-CM | POA: Diagnosis not present

## 2024-02-21 DIAGNOSIS — G2581 Restless legs syndrome: Secondary | ICD-10-CM | POA: Diagnosis not present

## 2024-02-21 DIAGNOSIS — Z86711 Personal history of pulmonary embolism: Secondary | ICD-10-CM | POA: Diagnosis not present

## 2024-02-21 DIAGNOSIS — F319 Bipolar disorder, unspecified: Secondary | ICD-10-CM | POA: Diagnosis not present

## 2024-02-21 DIAGNOSIS — F419 Anxiety disorder, unspecified: Secondary | ICD-10-CM | POA: Diagnosis not present

## 2024-02-21 DIAGNOSIS — K219 Gastro-esophageal reflux disease without esophagitis: Secondary | ICD-10-CM | POA: Diagnosis not present

## 2024-02-21 DIAGNOSIS — J309 Allergic rhinitis, unspecified: Secondary | ICD-10-CM | POA: Diagnosis not present

## 2024-02-21 DIAGNOSIS — J398 Other specified diseases of upper respiratory tract: Secondary | ICD-10-CM | POA: Diagnosis not present

## 2024-02-21 DIAGNOSIS — J920 Pleural plaque with presence of asbestos: Secondary | ICD-10-CM | POA: Diagnosis not present

## 2024-02-21 DIAGNOSIS — I5032 Chronic diastolic (congestive) heart failure: Secondary | ICD-10-CM | POA: Diagnosis not present

## 2024-02-21 DIAGNOSIS — M797 Fibromyalgia: Secondary | ICD-10-CM | POA: Diagnosis not present

## 2024-02-21 DIAGNOSIS — F32A Depression, unspecified: Secondary | ICD-10-CM | POA: Diagnosis not present

## 2024-02-21 DIAGNOSIS — I2510 Atherosclerotic heart disease of native coronary artery without angina pectoris: Secondary | ICD-10-CM | POA: Diagnosis not present

## 2024-02-21 DIAGNOSIS — J9611 Chronic respiratory failure with hypoxia: Secondary | ICD-10-CM | POA: Diagnosis not present

## 2024-02-21 DIAGNOSIS — M1990 Unspecified osteoarthritis, unspecified site: Secondary | ICD-10-CM | POA: Diagnosis not present

## 2024-02-24 ENCOUNTER — Encounter (HOSPITAL_COMMUNITY): Payer: Self-pay

## 2024-03-04 ENCOUNTER — Other Ambulatory Visit: Payer: Self-pay | Admitting: *Deleted

## 2024-03-04 DIAGNOSIS — I6523 Occlusion and stenosis of bilateral carotid arteries: Secondary | ICD-10-CM

## 2024-03-10 ENCOUNTER — Encounter: Payer: Medicare Other | Attending: Physical Medicine & Rehabilitation | Admitting: Physical Medicine & Rehabilitation

## 2024-03-10 DIAGNOSIS — Z79899 Other long term (current) drug therapy: Secondary | ICD-10-CM | POA: Insufficient documentation

## 2024-03-10 DIAGNOSIS — Z5181 Encounter for therapeutic drug level monitoring: Secondary | ICD-10-CM | POA: Insufficient documentation

## 2024-03-10 DIAGNOSIS — G894 Chronic pain syndrome: Secondary | ICD-10-CM | POA: Insufficient documentation

## 2024-03-17 ENCOUNTER — Ambulatory Visit (HOSPITAL_COMMUNITY): Admission: RE | Admit: 2024-03-17 | Source: Ambulatory Visit

## 2024-03-17 ENCOUNTER — Encounter: Payer: Self-pay | Admitting: Hematology & Oncology

## 2024-03-23 DIAGNOSIS — J920 Pleural plaque with presence of asbestos: Secondary | ICD-10-CM | POA: Diagnosis not present

## 2024-03-23 DIAGNOSIS — F32A Depression, unspecified: Secondary | ICD-10-CM | POA: Diagnosis not present

## 2024-03-23 DIAGNOSIS — F419 Anxiety disorder, unspecified: Secondary | ICD-10-CM | POA: Diagnosis not present

## 2024-03-23 DIAGNOSIS — K219 Gastro-esophageal reflux disease without esophagitis: Secondary | ICD-10-CM | POA: Diagnosis not present

## 2024-03-23 DIAGNOSIS — I5032 Chronic diastolic (congestive) heart failure: Secondary | ICD-10-CM | POA: Diagnosis not present

## 2024-03-23 DIAGNOSIS — G8929 Other chronic pain: Secondary | ICD-10-CM | POA: Diagnosis not present

## 2024-03-23 DIAGNOSIS — Z86711 Personal history of pulmonary embolism: Secondary | ICD-10-CM | POA: Diagnosis not present

## 2024-03-23 DIAGNOSIS — F319 Bipolar disorder, unspecified: Secondary | ICD-10-CM | POA: Diagnosis not present

## 2024-03-23 DIAGNOSIS — J9611 Chronic respiratory failure with hypoxia: Secondary | ICD-10-CM | POA: Diagnosis not present

## 2024-03-23 DIAGNOSIS — J309 Allergic rhinitis, unspecified: Secondary | ICD-10-CM | POA: Diagnosis not present

## 2024-03-23 DIAGNOSIS — J984 Other disorders of lung: Secondary | ICD-10-CM | POA: Diagnosis not present

## 2024-03-23 DIAGNOSIS — G2581 Restless legs syndrome: Secondary | ICD-10-CM | POA: Diagnosis not present

## 2024-03-23 DIAGNOSIS — J398 Other specified diseases of upper respiratory tract: Secondary | ICD-10-CM | POA: Diagnosis not present

## 2024-03-23 DIAGNOSIS — M797 Fibromyalgia: Secondary | ICD-10-CM | POA: Diagnosis not present

## 2024-03-23 DIAGNOSIS — J4540 Moderate persistent asthma, uncomplicated: Secondary | ICD-10-CM | POA: Diagnosis not present

## 2024-03-23 DIAGNOSIS — M1990 Unspecified osteoarthritis, unspecified site: Secondary | ICD-10-CM | POA: Diagnosis not present

## 2024-03-23 DIAGNOSIS — I2510 Atherosclerotic heart disease of native coronary artery without angina pectoris: Secondary | ICD-10-CM | POA: Diagnosis not present

## 2024-04-22 DEATH — deceased
